# Patient Record
Sex: Female | Born: 1945
Health system: Southern US, Community
[De-identification: ages and names within clinical notes are randomized; demographics above are authoritative.]

## PROBLEM LIST (undated history)

## (undated) DIAGNOSIS — I8392 Asymptomatic varicose veins of left lower extremity: Secondary | ICD-10-CM

## (undated) DIAGNOSIS — T8859XA Other complications of anesthesia, initial encounter: Secondary | ICD-10-CM

## (undated) DIAGNOSIS — M1990 Unspecified osteoarthritis, unspecified site: Secondary | ICD-10-CM

## (undated) DIAGNOSIS — T7840XA Allergy, unspecified, initial encounter: Secondary | ICD-10-CM

## (undated) DIAGNOSIS — K5790 Diverticulosis of intestine, part unspecified, without perforation or abscess without bleeding: Secondary | ICD-10-CM

## (undated) DIAGNOSIS — J302 Other seasonal allergic rhinitis: Secondary | ICD-10-CM

## (undated) DIAGNOSIS — G4733 Obstructive sleep apnea (adult) (pediatric): Secondary | ICD-10-CM

## (undated) DIAGNOSIS — I872 Venous insufficiency (chronic) (peripheral): Secondary | ICD-10-CM

## (undated) DIAGNOSIS — G4730 Sleep apnea, unspecified: Secondary | ICD-10-CM

## (undated) DIAGNOSIS — D3132 Benign neoplasm of left choroid: Secondary | ICD-10-CM

## (undated) DIAGNOSIS — K449 Diaphragmatic hernia without obstruction or gangrene: Secondary | ICD-10-CM

## (undated) DIAGNOSIS — H903 Sensorineural hearing loss, bilateral: Secondary | ICD-10-CM

## (undated) DIAGNOSIS — I82409 Acute embolism and thrombosis of unspecified deep veins of unspecified lower extremity: Secondary | ICD-10-CM

## (undated) DIAGNOSIS — T4145XA Adverse effect of unspecified anesthetic, initial encounter: Secondary | ICD-10-CM

## (undated) DIAGNOSIS — Z87442 Personal history of urinary calculi: Secondary | ICD-10-CM

## (undated) DIAGNOSIS — E559 Vitamin D deficiency, unspecified: Secondary | ICD-10-CM

## (undated) DIAGNOSIS — J45909 Unspecified asthma, uncomplicated: Secondary | ICD-10-CM

## (undated) DIAGNOSIS — Z8489 Family history of other specified conditions: Secondary | ICD-10-CM

## (undated) DIAGNOSIS — M47812 Spondylosis without myelopathy or radiculopathy, cervical region: Secondary | ICD-10-CM

## (undated) DIAGNOSIS — J189 Pneumonia, unspecified organism: Secondary | ICD-10-CM

## (undated) DIAGNOSIS — D689 Coagulation defect, unspecified: Secondary | ICD-10-CM

## (undated) DIAGNOSIS — I10 Essential (primary) hypertension: Secondary | ICD-10-CM

## (undated) DIAGNOSIS — F09 Unspecified mental disorder due to known physiological condition: Secondary | ICD-10-CM

## (undated) DIAGNOSIS — C439 Malignant melanoma of skin, unspecified: Secondary | ICD-10-CM

## (undated) DIAGNOSIS — G709 Myoneural disorder, unspecified: Secondary | ICD-10-CM

## (undated) DIAGNOSIS — Z923 Personal history of irradiation: Secondary | ICD-10-CM

## (undated) DIAGNOSIS — T884XXA Failed or difficult intubation, initial encounter: Secondary | ICD-10-CM

## (undated) DIAGNOSIS — H269 Unspecified cataract: Secondary | ICD-10-CM

## (undated) DIAGNOSIS — G2581 Restless legs syndrome: Secondary | ICD-10-CM

## (undated) HISTORY — DX: Obstructive sleep apnea (adult) (pediatric): G47.33

## (undated) HISTORY — DX: Acute embolism and thrombosis of unspecified deep veins of unspecified lower extremity: I82.409

## (undated) HISTORY — DX: Adverse effect of unspecified anesthetic, initial encounter: T41.45XA

## (undated) HISTORY — DX: Personal history of urinary calculi: Z87.442

## (undated) HISTORY — DX: Other seasonal allergic rhinitis: J30.2

## (undated) HISTORY — DX: Asymptomatic varicose veins of left lower extremity: I83.92

## (undated) HISTORY — DX: Diaphragmatic hernia without obstruction or gangrene: K44.9

## (undated) HISTORY — DX: Unspecified osteoarthritis, unspecified site: M19.90

## (undated) HISTORY — PX: PORTACATH PLACEMENT: SHX2246

## (undated) HISTORY — DX: Unspecified cataract: H26.9

## (undated) HISTORY — DX: Sleep apnea, unspecified: G47.30

## (undated) HISTORY — DX: Benign neoplasm of left choroid: D31.32

## (undated) HISTORY — DX: Restless legs syndrome: G25.81

## (undated) HISTORY — PX: UPPER GASTROINTESTINAL ENDOSCOPY: SHX188

## (undated) HISTORY — DX: Unspecified mental disorder due to known physiological condition: F09

## (undated) HISTORY — DX: Spondylosis without myelopathy or radiculopathy, cervical region: M47.812

## (undated) HISTORY — DX: Unspecified asthma, uncomplicated: J45.909

## (undated) HISTORY — DX: Sensorineural hearing loss, bilateral: H90.3

## (undated) HISTORY — DX: Vitamin D deficiency, unspecified: E55.9

## (undated) HISTORY — DX: Malignant melanoma of skin, unspecified: C43.9

## (undated) HISTORY — DX: Coagulation defect, unspecified: D68.9

## (undated) HISTORY — DX: Venous insufficiency (chronic) (peripheral): I87.2

## (undated) HISTORY — DX: Essential (primary) hypertension: I10

## (undated) HISTORY — DX: Diverticulosis of intestine, part unspecified, without perforation or abscess without bleeding: K57.90

## (undated) HISTORY — DX: Personal history of irradiation: Z92.3

## (undated) HISTORY — DX: Allergy, unspecified, initial encounter: T78.40XA

## (undated) HISTORY — DX: Other complications of anesthesia, initial encounter: T88.59XA

---

## 1983-11-06 HISTORY — PX: KNEE SURGERY: SHX244

## 1988-11-05 HISTORY — PX: BREAST SURGERY: SHX581

## 1999-11-06 HISTORY — PX: LUMBAR LAMINECTOMY: SHX95

## 2004-11-05 HISTORY — PX: ESOPHAGOGASTRODUODENOSCOPY: SHX1529

## 2005-03-05 HISTORY — PX: COLONOSCOPY: SHX174

## 2005-03-19 LAB — HM COLONOSCOPY: HM Colonoscopy: 1

## 2008-11-05 HISTORY — PX: DILATION AND CURETTAGE OF UTERUS: SHX78

## 2008-11-05 HISTORY — PX: ENDOVENOUS ABLATION SAPHENOUS VEIN W/ LASER: SUR449

## 2008-11-05 HISTORY — PX: CARDIOVASCULAR STRESS TEST: SHX262

## 2009-11-05 DIAGNOSIS — I82409 Acute embolism and thrombosis of unspecified deep veins of unspecified lower extremity: Secondary | ICD-10-CM

## 2009-11-05 HISTORY — DX: Acute embolism and thrombosis of unspecified deep veins of unspecified lower extremity: I82.409

## 2009-11-05 HISTORY — PX: MELANOMA EXCISION: SHX5266

## 2009-12-06 LAB — HM DEXA SCAN

## 2010-06-29 DIAGNOSIS — C439 Malignant melanoma of skin, unspecified: Secondary | ICD-10-CM

## 2010-06-29 HISTORY — DX: Malignant melanoma of skin, unspecified: C43.9

## 2010-07-20 ENCOUNTER — Ambulatory Visit (HOSPITAL_COMMUNITY): Admission: RE | Admit: 2010-07-20 | Discharge: 2010-07-20 | Payer: Self-pay | Admitting: General Surgery

## 2010-08-23 ENCOUNTER — Encounter: Admission: RE | Admit: 2010-08-23 | Discharge: 2010-08-23 | Payer: Self-pay | Admitting: General Surgery

## 2010-08-24 ENCOUNTER — Ambulatory Visit (HOSPITAL_BASED_OUTPATIENT_CLINIC_OR_DEPARTMENT_OTHER): Admission: RE | Admit: 2010-08-24 | Discharge: 2010-08-25 | Payer: Self-pay | Admitting: General Surgery

## 2010-09-01 ENCOUNTER — Ambulatory Visit: Payer: Self-pay | Admitting: Hematology & Oncology

## 2010-09-07 LAB — CBC WITH DIFFERENTIAL (CANCER CENTER ONLY)
BASO#: 0.1 10*3/uL (ref 0.0–0.2)
BASO%: 1.4 % (ref 0.0–2.0)
EOS%: 3.2 % (ref 0.0–7.0)
Eosinophils Absolute: 0.3 10*3/uL (ref 0.0–0.5)
HCT: 38.8 % (ref 34.8–46.6)
HGB: 13.3 g/dL (ref 11.6–15.9)
LYMPH#: 2.1 10*3/uL (ref 0.9–3.3)
LYMPH%: 25.5 % (ref 14.0–48.0)
MCH: 30.3 pg (ref 26.0–34.0)
MCHC: 34.2 g/dL (ref 32.0–36.0)
MCV: 89 fL (ref 81–101)
MONO#: 0.4 10*3/uL (ref 0.1–0.9)
MONO%: 4.8 % (ref 0.0–13.0)
NEUT#: 5.2 10*3/uL (ref 1.5–6.5)
NEUT%: 65.1 % (ref 39.6–80.0)
Platelets: 229 10*3/uL (ref 145–400)
RBC: 4.37 10*6/uL (ref 3.70–5.32)
RDW: 11.8 % (ref 10.5–14.6)
WBC: 8.1 10*3/uL (ref 3.9–10.0)

## 2010-09-07 LAB — COMPREHENSIVE METABOLIC PANEL
ALT: 10 U/L (ref 0–35)
AST: 8 U/L (ref 0–37)
Albumin: 4.3 g/dL (ref 3.5–5.2)
Alkaline Phosphatase: 58 U/L (ref 39–117)
BUN: 20 mg/dL (ref 6–23)
CO2: 25 mEq/L (ref 19–32)
Calcium: 10.1 mg/dL (ref 8.4–10.5)
Chloride: 101 mEq/L (ref 96–112)
Creatinine, Ser: 0.78 mg/dL (ref 0.40–1.20)
Glucose, Bld: 100 mg/dL — ABNORMAL HIGH (ref 70–99)
Potassium: 3.6 mEq/L (ref 3.5–5.3)
Sodium: 141 mEq/L (ref 135–145)
Total Bilirubin: 0.6 mg/dL (ref 0.3–1.2)
Total Protein: 6.9 g/dL (ref 6.0–8.3)

## 2010-09-07 LAB — LACTATE DEHYDROGENASE: LDH: 162 U/L (ref 94–250)

## 2010-09-07 LAB — TECHNOLOGIST REVIEW CHCC SATELLITE: Tech Review: ADEQUATE

## 2010-09-13 ENCOUNTER — Ambulatory Visit (HOSPITAL_COMMUNITY): Admission: RE | Admit: 2010-09-13 | Discharge: 2010-09-13 | Payer: Self-pay | Admitting: Hematology & Oncology

## 2010-10-06 ENCOUNTER — Ambulatory Visit (HOSPITAL_BASED_OUTPATIENT_CLINIC_OR_DEPARTMENT_OTHER)
Admission: RE | Admit: 2010-10-06 | Discharge: 2010-10-07 | Payer: Self-pay | Source: Home / Self Care | Admitting: General Surgery

## 2010-10-17 ENCOUNTER — Ambulatory Visit: Payer: Self-pay | Admitting: Hematology & Oncology

## 2010-10-19 LAB — COMPREHENSIVE METABOLIC PANEL
ALT: 13 U/L (ref 0–35)
AST: 8 U/L (ref 0–37)
Albumin: 4 g/dL (ref 3.5–5.2)
Alkaline Phosphatase: 55 U/L (ref 39–117)
BUN: 21 mg/dL (ref 6–23)
CO2: 27 mEq/L (ref 19–32)
Calcium: 9.6 mg/dL (ref 8.4–10.5)
Chloride: 104 mEq/L (ref 96–112)
Creatinine, Ser: 0.81 mg/dL (ref 0.40–1.20)
Glucose, Bld: 100 mg/dL — ABNORMAL HIGH (ref 70–99)
Potassium: 3.8 mEq/L (ref 3.5–5.3)
Sodium: 143 mEq/L (ref 135–145)
Total Bilirubin: 0.4 mg/dL (ref 0.3–1.2)
Total Protein: 6.1 g/dL (ref 6.0–8.3)

## 2010-10-19 LAB — CBC WITH DIFFERENTIAL (CANCER CENTER ONLY)
BASO#: 0 10*3/uL (ref 0.0–0.2)
BASO%: 0.5 % (ref 0.0–2.0)
EOS%: 3.1 % (ref 0.0–7.0)
Eosinophils Absolute: 0.2 10*3/uL (ref 0.0–0.5)
HCT: 37.2 % (ref 34.8–46.6)
HGB: 12.5 g/dL (ref 11.6–15.9)
LYMPH#: 1.8 10*3/uL (ref 0.9–3.3)
LYMPH%: 28.6 % (ref 14.0–48.0)
MCH: 30 pg (ref 26.0–34.0)
MCHC: 33.6 g/dL (ref 32.0–36.0)
MCV: 89 fL (ref 81–101)
MONO#: 0.3 10*3/uL (ref 0.1–0.9)
MONO%: 5.2 % (ref 0.0–13.0)
NEUT#: 4 10*3/uL (ref 1.5–6.5)
NEUT%: 62.6 % (ref 39.6–80.0)
Platelets: 216 10*3/uL (ref 145–400)
RBC: 4.17 10*6/uL (ref 3.70–5.32)
RDW: 11.6 % (ref 10.5–14.6)
WBC: 6.4 10*3/uL (ref 3.9–10.0)

## 2010-10-19 LAB — T4: T4, Total: 8.6 ug/dL (ref 5.0–12.5)

## 2010-10-19 LAB — TSH: TSH: 1.8 u[IU]/mL (ref 0.350–4.500)

## 2010-11-05 DIAGNOSIS — M47812 Spondylosis without myelopathy or radiculopathy, cervical region: Secondary | ICD-10-CM

## 2010-11-05 HISTORY — PX: DEEP AXILLARY SENTINEL NODE BIOPSY / EXCISION: SUR130

## 2010-11-05 HISTORY — DX: Spondylosis without myelopathy or radiculopathy, cervical region: M47.812

## 2010-11-08 ENCOUNTER — Ambulatory Visit (HOSPITAL_COMMUNITY)
Admission: RE | Admit: 2010-11-08 | Discharge: 2010-11-08 | Payer: Self-pay | Source: Home / Self Care | Attending: General Surgery | Admitting: General Surgery

## 2010-11-17 ENCOUNTER — Ambulatory Visit: Payer: Self-pay | Admitting: Hematology & Oncology

## 2010-11-20 LAB — CMP (CANCER CENTER ONLY)
ALT(SGPT): 41 U/L (ref 10–47)
AST: 34 U/L (ref 11–38)
Albumin: 3.3 g/dL (ref 3.3–5.5)
Alkaline Phosphatase: 42 U/L (ref 26–84)
BUN, Bld: 14 mg/dL (ref 7–22)
CO2: 32 mEq/L (ref 18–33)
Calcium: 9.2 mg/dL (ref 8.0–10.3)
Chloride: 106 mEq/L (ref 98–108)
Creat: 0.7 mg/dl (ref 0.6–1.2)
Glucose, Bld: 111 mg/dL (ref 73–118)
Potassium: 3.9 mEq/L (ref 3.3–4.7)
Sodium: 146 mEq/L — ABNORMAL HIGH (ref 128–145)
Total Bilirubin: 0.5 mg/dl (ref 0.20–1.60)
Total Protein: 6.2 g/dL — ABNORMAL LOW (ref 6.4–8.1)

## 2010-11-20 LAB — CBC WITH DIFFERENTIAL (CANCER CENTER ONLY)
BASO#: 0 10*3/uL (ref 0.0–0.2)
BASO%: 0.4 % (ref 0.0–2.0)
EOS%: 1 % (ref 0.0–7.0)
Eosinophils Absolute: 0 10*3/uL (ref 0.0–0.5)
HCT: 36.2 % (ref 34.8–46.6)
HGB: 12.4 g/dL (ref 11.6–15.9)
LYMPH#: 1.2 10*3/uL (ref 0.9–3.3)
LYMPH%: 41.1 % (ref 14.0–48.0)
MCH: 29.9 pg (ref 26.0–34.0)
MCHC: 34.2 g/dL (ref 32.0–36.0)
MCV: 88 fL (ref 81–101)
MONO#: 0.2 10*3/uL (ref 0.1–0.9)
MONO%: 8.4 % (ref 0.0–13.0)
NEUT#: 1.4 10*3/uL — ABNORMAL LOW (ref 1.5–6.5)
NEUT%: 49.1 % (ref 39.6–80.0)
Platelets: 94 10*3/uL — ABNORMAL LOW (ref 145–400)
RBC: 4.13 10*6/uL (ref 3.70–5.32)
RDW: 11.7 % (ref 10.5–14.6)
WBC: 2.8 10*3/uL — ABNORMAL LOW (ref 3.9–10.0)

## 2010-11-20 LAB — LACTATE DEHYDROGENASE: LDH: 207 U/L (ref 94–250)

## 2010-11-20 LAB — TSH: TSH: 2.119 u[IU]/mL (ref 0.350–4.500)

## 2010-11-27 LAB — COMPREHENSIVE METABOLIC PANEL
ALT: 56 U/L — ABNORMAL HIGH (ref 0–35)
AST: 26 U/L (ref 0–37)
Albumin: 3.5 g/dL (ref 3.5–5.2)
Alkaline Phosphatase: 41 U/L (ref 39–117)
BUN: 12 mg/dL (ref 6–23)
CO2: 30 mEq/L (ref 19–32)
Calcium: 8.7 mg/dL (ref 8.4–10.5)
Chloride: 105 mEq/L (ref 96–112)
Creatinine, Ser: 0.63 mg/dL (ref 0.40–1.20)
Glucose, Bld: 93 mg/dL (ref 70–99)
Potassium: 3.4 mEq/L — ABNORMAL LOW (ref 3.5–5.3)
Sodium: 145 mEq/L (ref 135–145)
Total Bilirubin: 0.3 mg/dL (ref 0.3–1.2)
Total Protein: 5.7 g/dL — ABNORMAL LOW (ref 6.0–8.3)

## 2010-11-27 LAB — CBC WITH DIFFERENTIAL (CANCER CENTER ONLY)
BASO#: 0 10*3/uL (ref 0.0–0.2)
BASO%: 0.4 % (ref 0.0–2.0)
EOS%: 1.3 % (ref 0.0–7.0)
Eosinophils Absolute: 0 10*3/uL (ref 0.0–0.5)
HCT: 36.6 % (ref 34.8–46.6)
HGB: 12.4 g/dL (ref 11.6–15.9)
LYMPH#: 1 10*3/uL (ref 0.9–3.3)
LYMPH%: 35.5 % (ref 14.0–48.0)
MCH: 29.6 pg (ref 26.0–34.0)
MCHC: 33.8 g/dL (ref 32.0–36.0)
MCV: 87 fL (ref 81–101)
MONO#: 0.3 10*3/uL (ref 0.1–0.9)
MONO%: 9.2 % (ref 0.0–13.0)
NEUT#: 1.5 10*3/uL (ref 1.5–6.5)
NEUT%: 53.6 % (ref 39.6–80.0)
Platelets: 110 10*3/uL — ABNORMAL LOW (ref 145–400)
RBC: 4.19 10*6/uL (ref 3.70–5.32)
RDW: 11.6 % (ref 10.5–14.6)
WBC: 2.7 10*3/uL — ABNORMAL LOW (ref 3.9–10.0)

## 2010-11-28 ENCOUNTER — Encounter
Admission: RE | Admit: 2010-11-28 | Discharge: 2010-12-04 | Payer: Self-pay | Source: Home / Self Care | Attending: General Surgery | Admitting: General Surgery

## 2010-12-04 LAB — CMP (CANCER CENTER ONLY)
ALT(SGPT): 68 U/L — ABNORMAL HIGH (ref 10–47)
AST: 43 U/L — ABNORMAL HIGH (ref 11–38)
Albumin: 3.1 g/dL — ABNORMAL LOW (ref 3.3–5.5)
Alkaline Phosphatase: 53 U/L (ref 26–84)
BUN, Bld: 11 mg/dL (ref 7–22)
CO2: 32 mEq/L (ref 18–33)
Calcium: 8.5 mg/dL (ref 8.0–10.3)
Chloride: 104 mEq/L (ref 98–108)
Creat: 0.6 mg/dl (ref 0.6–1.2)
Glucose, Bld: 100 mg/dL (ref 73–118)
Potassium: 3 mEq/L — ABNORMAL LOW (ref 3.3–4.7)
Sodium: 141 mEq/L (ref 128–145)
Total Bilirubin: 0.6 mg/dl (ref 0.20–1.60)
Total Protein: 5.9 g/dL — ABNORMAL LOW (ref 6.4–8.1)

## 2010-12-04 LAB — LACTATE DEHYDROGENASE: LDH: 240 U/L (ref 94–250)

## 2010-12-04 LAB — CBC WITH DIFFERENTIAL (CANCER CENTER ONLY)
BASO#: 0 10*3/uL (ref 0.0–0.2)
BASO%: 0.6 % (ref 0.0–2.0)
EOS%: 0.5 % (ref 0.0–7.0)
Eosinophils Absolute: 0 10*3/uL (ref 0.0–0.5)
HCT: 34.3 % — ABNORMAL LOW (ref 34.8–46.6)
HGB: 11.7 g/dL (ref 11.6–15.9)
LYMPH#: 0.8 10*3/uL — ABNORMAL LOW (ref 0.9–3.3)
LYMPH%: 40.6 % (ref 14.0–48.0)
MCH: 29.6 pg (ref 26.0–34.0)
MCHC: 34 g/dL (ref 32.0–36.0)
MCV: 87 fL (ref 81–101)
MONO#: 0.2 10*3/uL (ref 0.1–0.9)
MONO%: 12.1 % (ref 0.0–13.0)
NEUT#: 0.9 10*3/uL — ABNORMAL LOW (ref 1.5–6.5)
NEUT%: 46.2 % (ref 39.6–80.0)
Platelets: 103 10*3/uL — ABNORMAL LOW (ref 145–400)
RBC: 3.93 10*6/uL (ref 3.70–5.32)
RDW: 12.2 % (ref 10.5–14.6)
WBC: 1.9 10*3/uL — ABNORMAL LOW (ref 3.9–10.0)

## 2010-12-11 ENCOUNTER — Other Ambulatory Visit: Payer: Self-pay | Admitting: Family

## 2010-12-11 ENCOUNTER — Encounter (HOSPITAL_BASED_OUTPATIENT_CLINIC_OR_DEPARTMENT_OTHER): Payer: Private Health Insurance - Indemnity | Admitting: Hematology & Oncology

## 2010-12-11 DIAGNOSIS — Z5112 Encounter for antineoplastic immunotherapy: Secondary | ICD-10-CM

## 2010-12-11 DIAGNOSIS — C774 Secondary and unspecified malignant neoplasm of inguinal and lower limb lymph nodes: Secondary | ICD-10-CM

## 2010-12-11 DIAGNOSIS — C4359 Malignant melanoma of other part of trunk: Secondary | ICD-10-CM

## 2010-12-11 LAB — COMPREHENSIVE METABOLIC PANEL
ALT: 29 U/L (ref 0–35)
AST: 13 U/L (ref 0–37)
Albumin: 3.9 g/dL (ref 3.5–5.2)
Alkaline Phosphatase: 60 U/L (ref 39–117)
BUN: 16 mg/dL (ref 6–23)
CO2: 33 mEq/L — ABNORMAL HIGH (ref 19–32)
Calcium: 9.4 mg/dL (ref 8.4–10.5)
Chloride: 104 mEq/L (ref 96–112)
Creatinine, Ser: 0.76 mg/dL (ref 0.40–1.20)
Glucose, Bld: 105 mg/dL — ABNORMAL HIGH (ref 70–99)
Potassium: 2.8 mEq/L — ABNORMAL LOW (ref 3.5–5.3)
Sodium: 147 mEq/L — ABNORMAL HIGH (ref 135–145)
Total Bilirubin: 0.7 mg/dL (ref 0.3–1.2)
Total Protein: 6.3 g/dL (ref 6.0–8.3)

## 2010-12-11 LAB — CBC WITH DIFFERENTIAL (CANCER CENTER ONLY)
BASO#: 0 10*3/uL (ref 0.0–0.2)
BASO%: 0.4 % (ref 0.0–2.0)
EOS%: 0.7 % (ref 0.0–7.0)
Eosinophils Absolute: 0 10*3/uL (ref 0.0–0.5)
HCT: 33.7 % — ABNORMAL LOW (ref 34.8–46.6)
HGB: 11.5 g/dL — ABNORMAL LOW (ref 11.6–15.9)
LYMPH#: 1.2 10*3/uL (ref 0.9–3.3)
LYMPH%: 20.3 % (ref 14.0–48.0)
MCH: 30 pg (ref 26.0–34.0)
MCHC: 34.2 g/dL (ref 32.0–36.0)
MCV: 88 fL (ref 81–101)
MONO#: 0.7 10*3/uL (ref 0.1–0.9)
MONO%: 12.7 % (ref 0.0–13.0)
NEUT#: 3.7 10*3/uL (ref 1.5–6.5)
NEUT%: 65.9 % (ref 39.6–80.0)
Platelets: 173 10*3/uL (ref 145–400)
RBC: 3.84 10*6/uL (ref 3.70–5.32)
RDW: 12 % (ref 10.5–14.6)
WBC: 5.7 10*3/uL (ref 3.9–10.0)

## 2010-12-12 ENCOUNTER — Encounter (HOSPITAL_BASED_OUTPATIENT_CLINIC_OR_DEPARTMENT_OTHER): Payer: Private Health Insurance - Indemnity | Admitting: Hematology & Oncology

## 2010-12-12 ENCOUNTER — Other Ambulatory Visit: Payer: Self-pay | Admitting: Family

## 2010-12-12 DIAGNOSIS — Z5112 Encounter for antineoplastic immunotherapy: Secondary | ICD-10-CM

## 2010-12-12 DIAGNOSIS — C4359 Malignant melanoma of other part of trunk: Secondary | ICD-10-CM

## 2010-12-12 LAB — BASIC METABOLIC PANEL - CANCER CENTER ONLY
BUN, Bld: 13 mg/dL (ref 7–22)
CO2: 32 mEq/L (ref 18–33)
Calcium: 8.9 mg/dL (ref 8.0–10.3)
Chloride: 104 mEq/L (ref 98–108)
Creat: 0.7 mg/dl (ref 0.6–1.2)
Glucose, Bld: 109 mg/dL (ref 73–118)
Potassium: 3 mEq/L — ABNORMAL LOW (ref 3.3–4.7)
Sodium: 146 mEq/L — ABNORMAL HIGH (ref 128–145)

## 2010-12-13 ENCOUNTER — Other Ambulatory Visit: Payer: Self-pay | Admitting: Family

## 2010-12-13 ENCOUNTER — Encounter (HOSPITAL_BASED_OUTPATIENT_CLINIC_OR_DEPARTMENT_OTHER): Payer: Private Health Insurance - Indemnity | Admitting: Hematology & Oncology

## 2010-12-13 ENCOUNTER — Encounter: Payer: Self-pay | Admitting: Rehabilitation

## 2010-12-13 DIAGNOSIS — Z5112 Encounter for antineoplastic immunotherapy: Secondary | ICD-10-CM

## 2010-12-13 DIAGNOSIS — C4359 Malignant melanoma of other part of trunk: Secondary | ICD-10-CM

## 2010-12-13 LAB — BASIC METABOLIC PANEL - CANCER CENTER ONLY
BUN, Bld: 16 mg/dL (ref 7–22)
CO2: 30 mEq/L (ref 18–33)
Calcium: 8.4 mg/dL (ref 8.0–10.3)
Chloride: 105 mEq/L (ref 98–108)
Creat: 0.7 mg/dl (ref 0.6–1.2)
Glucose, Bld: 126 mg/dL — ABNORMAL HIGH (ref 73–118)
Potassium: 3.2 mEq/L — ABNORMAL LOW (ref 3.3–4.7)
Sodium: 143 mEq/L (ref 128–145)

## 2010-12-14 ENCOUNTER — Encounter (HOSPITAL_BASED_OUTPATIENT_CLINIC_OR_DEPARTMENT_OTHER): Payer: Private Health Insurance - Indemnity | Admitting: Hematology & Oncology

## 2010-12-14 ENCOUNTER — Other Ambulatory Visit: Payer: Self-pay | Admitting: Family

## 2010-12-14 DIAGNOSIS — Z5112 Encounter for antineoplastic immunotherapy: Secondary | ICD-10-CM

## 2010-12-14 DIAGNOSIS — C4359 Malignant melanoma of other part of trunk: Secondary | ICD-10-CM

## 2010-12-14 LAB — BASIC METABOLIC PANEL - CANCER CENTER ONLY
BUN, Bld: 15 mg/dL (ref 7–22)
CO2: 29 mEq/L (ref 18–33)
Calcium: 8.6 mg/dL (ref 8.0–10.3)
Chloride: 104 mEq/L (ref 98–108)
Creat: 0.7 mg/dl (ref 0.6–1.2)
Glucose, Bld: 120 mg/dL — ABNORMAL HIGH (ref 73–118)
Potassium: 3.8 mEq/L (ref 3.3–4.7)
Sodium: 143 mEq/L (ref 128–145)

## 2010-12-15 ENCOUNTER — Encounter (HOSPITAL_BASED_OUTPATIENT_CLINIC_OR_DEPARTMENT_OTHER): Payer: Private Health Insurance - Indemnity | Admitting: Hematology & Oncology

## 2010-12-15 ENCOUNTER — Encounter: Payer: Self-pay | Admitting: Rehabilitation

## 2010-12-15 DIAGNOSIS — Z5112 Encounter for antineoplastic immunotherapy: Secondary | ICD-10-CM

## 2010-12-15 DIAGNOSIS — C4359 Malignant melanoma of other part of trunk: Secondary | ICD-10-CM

## 2010-12-18 ENCOUNTER — Other Ambulatory Visit: Payer: Self-pay | Admitting: Family

## 2010-12-18 ENCOUNTER — Encounter (HOSPITAL_BASED_OUTPATIENT_CLINIC_OR_DEPARTMENT_OTHER): Payer: Private Health Insurance - Indemnity | Admitting: Hematology & Oncology

## 2010-12-18 DIAGNOSIS — C4359 Malignant melanoma of other part of trunk: Secondary | ICD-10-CM

## 2010-12-18 LAB — CBC WITH DIFFERENTIAL (CANCER CENTER ONLY)
BASO#: 0 10*3/uL (ref 0.0–0.2)
BASO%: 0.7 % (ref 0.0–2.0)
EOS%: 1.4 % (ref 0.0–7.0)
Eosinophils Absolute: 0 10*3/uL (ref 0.0–0.5)
HCT: 33.4 % — ABNORMAL LOW (ref 34.8–46.6)
HGB: 11.4 g/dL — ABNORMAL LOW (ref 11.6–15.9)
LYMPH#: 1.4 10*3/uL (ref 0.9–3.3)
LYMPH%: 44.7 % (ref 14.0–48.0)
MCH: 29.8 pg (ref 26.0–34.0)
MCHC: 34.3 g/dL (ref 32.0–36.0)
MCV: 87 fL (ref 81–101)
MONO#: 0.4 10*3/uL (ref 0.1–0.9)
MONO%: 12 % (ref 0.0–13.0)
NEUT#: 1.3 10*3/uL — ABNORMAL LOW (ref 1.5–6.5)
NEUT%: 41.2 % (ref 39.6–80.0)
Platelets: 128 10*3/uL — ABNORMAL LOW (ref 145–400)
RBC: 3.84 10*6/uL (ref 3.70–5.32)
RDW: 11.9 % (ref 10.5–14.6)
WBC: 3.1 10*3/uL — ABNORMAL LOW (ref 3.9–10.0)

## 2010-12-27 ENCOUNTER — Encounter: Payer: Self-pay | Admitting: Rehabilitation

## 2010-12-29 ENCOUNTER — Ambulatory Visit: Payer: Private Health Insurance - Indemnity | Attending: General Surgery | Admitting: Rehabilitation

## 2010-12-29 DIAGNOSIS — M25619 Stiffness of unspecified shoulder, not elsewhere classified: Secondary | ICD-10-CM | POA: Insufficient documentation

## 2010-12-29 DIAGNOSIS — IMO0001 Reserved for inherently not codable concepts without codable children: Secondary | ICD-10-CM | POA: Insufficient documentation

## 2011-01-03 ENCOUNTER — Ambulatory Visit: Payer: Private Health Insurance - Indemnity | Admitting: Physical Therapy

## 2011-01-05 ENCOUNTER — Ambulatory Visit: Payer: Private Health Insurance - Indemnity | Attending: General Surgery | Admitting: Rehabilitation

## 2011-01-05 DIAGNOSIS — IMO0001 Reserved for inherently not codable concepts without codable children: Secondary | ICD-10-CM | POA: Insufficient documentation

## 2011-01-05 DIAGNOSIS — M25619 Stiffness of unspecified shoulder, not elsewhere classified: Secondary | ICD-10-CM | POA: Insufficient documentation

## 2011-01-10 ENCOUNTER — Ambulatory Visit: Payer: Private Health Insurance - Indemnity | Admitting: Rehabilitation

## 2011-01-12 ENCOUNTER — Ambulatory Visit: Payer: Private Health Insurance - Indemnity | Admitting: Physical Therapy

## 2011-01-15 ENCOUNTER — Encounter (HOSPITAL_BASED_OUTPATIENT_CLINIC_OR_DEPARTMENT_OTHER): Payer: Private Health Insurance - Indemnity | Admitting: Hematology & Oncology

## 2011-01-15 DIAGNOSIS — C4359 Malignant melanoma of other part of trunk: Secondary | ICD-10-CM

## 2011-01-15 DIAGNOSIS — Z5112 Encounter for antineoplastic immunotherapy: Secondary | ICD-10-CM

## 2011-01-15 LAB — DIFFERENTIAL
Basophils Absolute: 0 10*3/uL (ref 0.0–0.1)
Basophils Relative: 0 % (ref 0–1)
Eosinophils Absolute: 0.1 10*3/uL (ref 0.0–0.7)
Eosinophils Relative: 2 % (ref 0–5)
Lymphocytes Relative: 34 % (ref 12–46)
Lymphs Abs: 2.1 10*3/uL (ref 0.7–4.0)
Monocytes Absolute: 0.5 10*3/uL (ref 0.1–1.0)
Monocytes Relative: 7 % (ref 3–12)
Neutro Abs: 3.5 10*3/uL (ref 1.7–7.7)
Neutrophils Relative %: 56 % (ref 43–77)

## 2011-01-15 LAB — BASIC METABOLIC PANEL
BUN: 17 mg/dL (ref 6–23)
CO2: 30 mEq/L (ref 19–32)
Calcium: 9.6 mg/dL (ref 8.4–10.5)
Chloride: 104 mEq/L (ref 96–112)
Creatinine, Ser: 0.74 mg/dL (ref 0.4–1.2)
GFR calc Af Amer: >60 mL/min (ref >60)
GFR calc non Af Amer: >60 mL/min (ref >60)
Glucose, Bld: 102 mg/dL — ABNORMAL HIGH (ref 70–99)
Potassium: 3.7 mEq/L (ref 3.5–5.1)
Sodium: 142 mEq/L (ref 135–145)

## 2011-01-15 LAB — CBC
HCT: 40 % (ref 36.0–46.0)
Hemoglobin: 13 g/dL (ref 12.0–15.0)
MCH: 29.4 pg (ref 26.0–34.0)
MCHC: 32.5 g/dL (ref 30.0–36.0)
MCV: 90.5 fL (ref 78.0–100.0)
Platelets: 177 10*3/uL (ref 150–400)
RBC: 4.42 MIL/uL (ref 3.87–5.11)
RDW: 13 % (ref 11.5–15.5)
WBC: 6.2 10*3/uL (ref 4.0–10.5)

## 2011-01-15 LAB — APTT: aPTT: 27 seconds (ref 24–37)

## 2011-01-15 LAB — PROTIME-INR
INR: 0.99 (ref 0.00–1.49)
Prothrombin Time: 13.3 seconds (ref 11.6–15.2)

## 2011-01-15 LAB — SURGICAL PCR SCREEN
MRSA, PCR: NEGATIVE
Staphylococcus aureus: NEGATIVE

## 2011-01-16 ENCOUNTER — Encounter (HOSPITAL_BASED_OUTPATIENT_CLINIC_OR_DEPARTMENT_OTHER): Payer: Private Health Insurance - Indemnity | Admitting: Hematology & Oncology

## 2011-01-16 DIAGNOSIS — Z5112 Encounter for antineoplastic immunotherapy: Secondary | ICD-10-CM

## 2011-01-16 DIAGNOSIS — C4359 Malignant melanoma of other part of trunk: Secondary | ICD-10-CM

## 2011-01-16 LAB — DIFFERENTIAL
Basophils Absolute: 0 10*3/uL (ref 0.0–0.1)
Basophils Relative: 0 % (ref 0–1)
Eosinophils Absolute: 0.3 10*3/uL (ref 0.0–0.7)
Eosinophils Relative: 4 % (ref 0–5)
Lymphocytes Relative: 32 % (ref 12–46)
Lymphs Abs: 2.2 10*3/uL (ref 0.7–4.0)
Monocytes Absolute: 0.5 10*3/uL (ref 0.1–1.0)
Monocytes Relative: 7 % (ref 3–12)
Neutro Abs: 3.9 10*3/uL (ref 1.7–7.7)
Neutrophils Relative %: 57 % (ref 43–77)

## 2011-01-16 LAB — CBC
HCT: 39.5 % (ref 36.0–46.0)
Hemoglobin: 12.8 g/dL (ref 12.0–15.0)
MCH: 29.8 pg (ref 26.0–34.0)
MCHC: 32.4 g/dL (ref 30.0–36.0)
MCV: 91.9 fL (ref 78.0–100.0)
Platelets: 200 10*3/uL (ref 150–400)
RBC: 4.3 MIL/uL (ref 3.87–5.11)
RDW: 13.1 % (ref 11.5–15.5)
WBC: 6.9 10*3/uL (ref 4.0–10.5)

## 2011-01-16 LAB — BASIC METABOLIC PANEL
BUN: 18 mg/dL (ref 6–23)
CO2: 32 mEq/L (ref 19–32)
Calcium: 9.6 mg/dL (ref 8.4–10.5)
Chloride: 104 mEq/L (ref 96–112)
Creatinine, Ser: 0.93 mg/dL (ref 0.4–1.2)
GFR calc Af Amer: >60 mL/min (ref >60)
GFR calc non Af Amer: >60 mL/min (ref >60)
Glucose, Bld: 128 mg/dL — ABNORMAL HIGH (ref 70–99)
Potassium: 3.6 mEq/L (ref 3.5–5.1)
Sodium: 141 mEq/L (ref 135–145)

## 2011-01-16 LAB — GLUCOSE, CAPILLARY: Glucose-Capillary: 99 mg/dL (ref 70–99)

## 2011-01-17 ENCOUNTER — Encounter (HOSPITAL_BASED_OUTPATIENT_CLINIC_OR_DEPARTMENT_OTHER): Payer: Private Health Insurance - Indemnity | Admitting: Hematology & Oncology

## 2011-01-17 DIAGNOSIS — C4359 Malignant melanoma of other part of trunk: Secondary | ICD-10-CM

## 2011-01-17 DIAGNOSIS — Z5112 Encounter for antineoplastic immunotherapy: Secondary | ICD-10-CM

## 2011-01-17 LAB — DIFFERENTIAL
Basophils Absolute: 0 10*3/uL (ref 0.0–0.1)
Basophils Relative: 0 % (ref 0–1)
Eosinophils Absolute: 0.1 10*3/uL (ref 0.0–0.7)
Eosinophils Relative: 2 % (ref 0–5)
Lymphocytes Relative: 29 % (ref 12–46)
Lymphs Abs: 1.6 10*3/uL (ref 0.7–4.0)
Monocytes Absolute: 0.3 10*3/uL (ref 0.1–1.0)
Monocytes Relative: 6 % (ref 3–12)
Neutro Abs: 3.4 10*3/uL (ref 1.7–7.7)
Neutrophils Relative %: 63 % (ref 43–77)

## 2011-01-17 LAB — COMPREHENSIVE METABOLIC PANEL
ALT: 15 U/L (ref 0–35)
AST: 14 U/L (ref 0–37)
Albumin: 3.9 g/dL (ref 3.5–5.2)
Alkaline Phosphatase: 56 U/L (ref 39–117)
BUN: 15 mg/dL (ref 6–23)
CO2: 32 mEq/L (ref 19–32)
Calcium: 9.5 mg/dL (ref 8.4–10.5)
Chloride: 105 mEq/L (ref 96–112)
Creatinine, Ser: 0.78 mg/dL (ref 0.4–1.2)
GFR calc Af Amer: >60 mL/min (ref >60)
GFR calc non Af Amer: >60 mL/min (ref >60)
Glucose, Bld: 94 mg/dL (ref 70–99)
Potassium: 3.5 mEq/L (ref 3.5–5.1)
Sodium: 143 mEq/L (ref 135–145)
Total Bilirubin: 0.9 mg/dL (ref 0.3–1.2)
Total Protein: 6.9 g/dL (ref 6.0–8.3)

## 2011-01-17 LAB — CBC
HCT: 42.1 % (ref 36.0–46.0)
Hemoglobin: 13.9 g/dL (ref 12.0–15.0)
MCH: 29.9 pg (ref 26.0–34.0)
MCHC: 33 g/dL (ref 30.0–36.0)
MCV: 90.5 fL (ref 78.0–100.0)
Platelets: 188 10*3/uL (ref 150–400)
RBC: 4.65 MIL/uL (ref 3.87–5.11)
RDW: 13.1 % (ref 11.5–15.5)
WBC: 5.3 10*3/uL (ref 4.0–10.5)

## 2011-01-18 ENCOUNTER — Encounter (HOSPITAL_BASED_OUTPATIENT_CLINIC_OR_DEPARTMENT_OTHER): Payer: Private Health Insurance - Indemnity

## 2011-01-18 DIAGNOSIS — Z5112 Encounter for antineoplastic immunotherapy: Secondary | ICD-10-CM

## 2011-01-18 DIAGNOSIS — C4359 Malignant melanoma of other part of trunk: Secondary | ICD-10-CM

## 2011-01-19 ENCOUNTER — Encounter: Payer: Private Health Insurance - Indemnity | Admitting: Physical Therapy

## 2011-01-19 ENCOUNTER — Encounter (HOSPITAL_BASED_OUTPATIENT_CLINIC_OR_DEPARTMENT_OTHER): Payer: Private Health Insurance - Indemnity

## 2011-01-22 ENCOUNTER — Ambulatory Visit: Payer: Private Health Insurance - Indemnity | Admitting: Physical Therapy

## 2011-01-22 ENCOUNTER — Other Ambulatory Visit: Payer: Self-pay | Admitting: Hematology & Oncology

## 2011-01-22 ENCOUNTER — Encounter (HOSPITAL_BASED_OUTPATIENT_CLINIC_OR_DEPARTMENT_OTHER): Payer: Private Health Insurance - Indemnity | Admitting: Hematology & Oncology

## 2011-01-22 ENCOUNTER — Other Ambulatory Visit: Payer: Self-pay | Admitting: Family

## 2011-01-22 DIAGNOSIS — C4359 Malignant melanoma of other part of trunk: Secondary | ICD-10-CM

## 2011-01-22 DIAGNOSIS — Z5112 Encounter for antineoplastic immunotherapy: Secondary | ICD-10-CM

## 2011-01-22 LAB — CBC WITH DIFFERENTIAL (CANCER CENTER ONLY)
BASO#: 0 10*3/uL (ref 0.0–0.2)
BASO%: 0.3 % (ref 0.0–2.0)
EOS%: 2 % (ref 0.0–7.0)
Eosinophils Absolute: 0.1 10*3/uL (ref 0.0–0.5)
HCT: 37.6 % (ref 34.8–46.6)
HGB: 12.7 g/dL (ref 11.6–15.9)
LYMPH#: 1.6 10*3/uL (ref 0.9–3.3)
LYMPH%: 46.9 % (ref 14.0–48.0)
MCH: 28.9 pg (ref 26.0–34.0)
MCHC: 33.8 g/dL (ref 32.0–36.0)
MCV: 86 fL (ref 81–101)
MONO#: 0.4 10*3/uL (ref 0.1–0.9)
MONO%: 10.5 % (ref 0.0–13.0)
NEUT#: 1.4 10*3/uL — ABNORMAL LOW (ref 1.5–6.5)
NEUT%: 40.3 % (ref 39.6–80.0)
Platelets: 140 10*3/uL — ABNORMAL LOW (ref 145–400)
RBC: 4.4 10*6/uL (ref 3.70–5.32)
RDW: 13.6 % (ref 11.1–15.7)
WBC: 3.4 10*3/uL — ABNORMAL LOW (ref 3.9–10.0)

## 2011-01-22 LAB — TSH: TSH: 1.926 u[IU]/mL (ref 0.350–4.500)

## 2011-01-22 LAB — T4: T4, Total: 9.8 ug/dL (ref 5.0–12.5)

## 2011-01-23 ENCOUNTER — Encounter (HOSPITAL_BASED_OUTPATIENT_CLINIC_OR_DEPARTMENT_OTHER): Payer: Private Health Insurance - Indemnity | Admitting: Hematology & Oncology

## 2011-01-23 DIAGNOSIS — Z5112 Encounter for antineoplastic immunotherapy: Secondary | ICD-10-CM

## 2011-01-23 DIAGNOSIS — C4359 Malignant melanoma of other part of trunk: Secondary | ICD-10-CM

## 2011-01-24 ENCOUNTER — Encounter (HOSPITAL_BASED_OUTPATIENT_CLINIC_OR_DEPARTMENT_OTHER): Payer: Private Health Insurance - Indemnity | Admitting: Hematology & Oncology

## 2011-01-24 DIAGNOSIS — C4359 Malignant melanoma of other part of trunk: Secondary | ICD-10-CM

## 2011-01-24 DIAGNOSIS — Z5112 Encounter for antineoplastic immunotherapy: Secondary | ICD-10-CM

## 2011-01-25 ENCOUNTER — Encounter (HOSPITAL_BASED_OUTPATIENT_CLINIC_OR_DEPARTMENT_OTHER): Payer: Private Health Insurance - Indemnity | Admitting: Hematology & Oncology

## 2011-01-25 DIAGNOSIS — Z5112 Encounter for antineoplastic immunotherapy: Secondary | ICD-10-CM

## 2011-01-25 DIAGNOSIS — C4359 Malignant melanoma of other part of trunk: Secondary | ICD-10-CM

## 2011-01-26 ENCOUNTER — Encounter (HOSPITAL_BASED_OUTPATIENT_CLINIC_OR_DEPARTMENT_OTHER): Payer: Private Health Insurance - Indemnity | Admitting: Hematology & Oncology

## 2011-01-26 DIAGNOSIS — Z5112 Encounter for antineoplastic immunotherapy: Secondary | ICD-10-CM

## 2011-01-26 DIAGNOSIS — C4359 Malignant melanoma of other part of trunk: Secondary | ICD-10-CM

## 2011-01-29 ENCOUNTER — Ambulatory Visit: Payer: Private Health Insurance - Indemnity | Admitting: Physical Therapy

## 2011-01-29 ENCOUNTER — Encounter (HOSPITAL_BASED_OUTPATIENT_CLINIC_OR_DEPARTMENT_OTHER): Payer: Private Health Insurance - Indemnity | Admitting: Hematology & Oncology

## 2011-01-29 ENCOUNTER — Other Ambulatory Visit: Payer: Self-pay | Admitting: Hematology & Oncology

## 2011-01-29 DIAGNOSIS — Z5112 Encounter for antineoplastic immunotherapy: Secondary | ICD-10-CM

## 2011-01-29 DIAGNOSIS — C4359 Malignant melanoma of other part of trunk: Secondary | ICD-10-CM

## 2011-01-29 LAB — CBC WITH DIFFERENTIAL (CANCER CENTER ONLY)
BASO#: 0 10*3/uL (ref 0.0–0.2)
BASO%: 0 % (ref 0.0–2.0)
EOS%: 1.5 % (ref 0.0–7.0)
Eosinophils Absolute: 0 10*3/uL (ref 0.0–0.5)
HCT: 37.2 % (ref 34.8–46.6)
HGB: 12.1 g/dL (ref 11.6–15.9)
LYMPH#: 1.2 10*3/uL (ref 0.9–3.3)
LYMPH%: 42.7 % (ref 14.0–48.0)
MCH: 28.6 pg (ref 26.0–34.0)
MCHC: 32.5 g/dL (ref 32.0–36.0)
MCV: 88 fL (ref 81–101)
MONO#: 0.3 10*3/uL (ref 0.1–0.9)
MONO%: 11.7 % (ref 0.0–13.0)
NEUT#: 1.2 10*3/uL — ABNORMAL LOW (ref 1.5–6.5)
NEUT%: 44.1 % (ref 39.6–80.0)
Platelets: 118 10*3/uL — ABNORMAL LOW (ref 145–400)
RBC: 4.23 10*6/uL (ref 3.70–5.32)
RDW: 14 % (ref 11.1–15.7)
WBC: 2.7 10*3/uL — ABNORMAL LOW (ref 3.9–10.0)

## 2011-01-29 LAB — CMP (CANCER CENTER ONLY)
ALT(SGPT): 244 U/L — ABNORMAL HIGH (ref 10–47)
AST: 112 U/L — ABNORMAL HIGH (ref 11–38)
Albumin: 3.4 g/dL (ref 3.3–5.5)
Alkaline Phosphatase: 46 U/L (ref 26–84)
BUN, Bld: 13 mg/dL (ref 7–22)
CO2: 30 mEq/L (ref 18–33)
Calcium: 9.2 mg/dL (ref 8.0–10.3)
Chloride: 98 mEq/L (ref 98–108)
Creat: 0.4 mg/dl — ABNORMAL LOW (ref 0.6–1.2)
Glucose, Bld: 108 mg/dL (ref 73–118)
Potassium: 3.8 mEq/L (ref 3.3–4.7)
Sodium: 142 mEq/L (ref 128–145)
Total Bilirubin: 0.6 mg/dl (ref 0.20–1.60)
Total Protein: 6.5 g/dL (ref 6.4–8.1)

## 2011-01-30 ENCOUNTER — Encounter (HOSPITAL_BASED_OUTPATIENT_CLINIC_OR_DEPARTMENT_OTHER): Payer: Private Health Insurance - Indemnity | Admitting: Hematology & Oncology

## 2011-01-30 ENCOUNTER — Other Ambulatory Visit: Payer: Self-pay | Admitting: Family

## 2011-01-30 DIAGNOSIS — C4359 Malignant melanoma of other part of trunk: Secondary | ICD-10-CM

## 2011-01-30 DIAGNOSIS — Z5112 Encounter for antineoplastic immunotherapy: Secondary | ICD-10-CM

## 2011-01-30 LAB — CMP (CANCER CENTER ONLY)
ALT(SGPT): 186 U/L — ABNORMAL HIGH (ref 10–47)
AST: 70 U/L — ABNORMAL HIGH (ref 11–38)
Albumin: 3.1 g/dL — ABNORMAL LOW (ref 3.3–5.5)
Alkaline Phosphatase: 44 U/L (ref 26–84)
BUN, Bld: 16 mg/dL (ref 7–22)
CO2: 30 mEq/L (ref 18–33)
Calcium: 9 mg/dL (ref 8.0–10.3)
Chloride: 101 mEq/L (ref 98–108)
Creat: 0.5 mg/dl — ABNORMAL LOW (ref 0.6–1.2)
Glucose, Bld: 120 mg/dL — ABNORMAL HIGH (ref 73–118)
Potassium: 3.2 mEq/L — ABNORMAL LOW (ref 3.3–4.7)
Sodium: 140 mEq/L (ref 128–145)
Total Bilirubin: 0.8 mg/dl (ref 0.20–1.60)
Total Protein: 6 g/dL — ABNORMAL LOW (ref 6.4–8.1)

## 2011-01-31 ENCOUNTER — Encounter (HOSPITAL_BASED_OUTPATIENT_CLINIC_OR_DEPARTMENT_OTHER): Payer: Private Health Insurance - Indemnity | Admitting: Hematology & Oncology

## 2011-01-31 DIAGNOSIS — C4359 Malignant melanoma of other part of trunk: Secondary | ICD-10-CM

## 2011-01-31 DIAGNOSIS — Z5112 Encounter for antineoplastic immunotherapy: Secondary | ICD-10-CM

## 2011-02-01 ENCOUNTER — Encounter (HOSPITAL_BASED_OUTPATIENT_CLINIC_OR_DEPARTMENT_OTHER): Payer: Private Health Insurance - Indemnity | Admitting: Hematology & Oncology

## 2011-02-02 ENCOUNTER — Encounter (HOSPITAL_BASED_OUTPATIENT_CLINIC_OR_DEPARTMENT_OTHER): Payer: Private Health Insurance - Indemnity | Admitting: Hematology & Oncology

## 2011-02-02 ENCOUNTER — Other Ambulatory Visit: Payer: Self-pay | Admitting: Hematology & Oncology

## 2011-02-02 DIAGNOSIS — C4359 Malignant melanoma of other part of trunk: Secondary | ICD-10-CM

## 2011-02-02 DIAGNOSIS — Z5112 Encounter for antineoplastic immunotherapy: Secondary | ICD-10-CM

## 2011-02-02 LAB — BASIC METABOLIC PANEL - CANCER CENTER ONLY
BUN, Bld: 11 mg/dL (ref 7–22)
CO2: 28 mEq/L (ref 18–33)
Calcium: 8.7 mg/dL (ref 8.0–10.3)
Chloride: 104 mEq/L (ref 98–108)
Creat: 0.5 mg/dl — ABNORMAL LOW (ref 0.6–1.2)
Glucose, Bld: 100 mg/dL (ref 73–118)
Potassium: 4 mEq/L (ref 3.3–4.7)
Sodium: 141 mEq/L (ref 128–145)

## 2011-02-05 ENCOUNTER — Other Ambulatory Visit: Payer: Self-pay | Admitting: Hematology & Oncology

## 2011-02-05 ENCOUNTER — Ambulatory Visit: Payer: Private Health Insurance - Indemnity | Attending: General Surgery | Admitting: Physical Therapy

## 2011-02-05 ENCOUNTER — Encounter: Payer: Private Health Insurance - Indemnity | Admitting: Rehabilitation

## 2011-02-05 ENCOUNTER — Encounter (HOSPITAL_BASED_OUTPATIENT_CLINIC_OR_DEPARTMENT_OTHER): Payer: Private Health Insurance - Indemnity | Admitting: Hematology & Oncology

## 2011-02-05 DIAGNOSIS — IMO0001 Reserved for inherently not codable concepts without codable children: Secondary | ICD-10-CM | POA: Insufficient documentation

## 2011-02-05 DIAGNOSIS — Z5112 Encounter for antineoplastic immunotherapy: Secondary | ICD-10-CM

## 2011-02-05 DIAGNOSIS — C4359 Malignant melanoma of other part of trunk: Secondary | ICD-10-CM

## 2011-02-05 DIAGNOSIS — M25619 Stiffness of unspecified shoulder, not elsewhere classified: Secondary | ICD-10-CM | POA: Insufficient documentation

## 2011-02-05 LAB — CMP (CANCER CENTER ONLY)
ALT(SGPT): 220 U/L — ABNORMAL HIGH (ref 10–47)
AST: 71 U/L — ABNORMAL HIGH (ref 11–38)
Albumin: 3.3 g/dL (ref 3.3–5.5)
Alkaline Phosphatase: 61 U/L (ref 26–84)
BUN, Bld: 15 mg/dL (ref 7–22)
CO2: 29 mEq/L (ref 18–33)
Calcium: 9.3 mg/dL (ref 8.0–10.3)
Chloride: 102 mEq/L (ref 98–108)
Creat: 0.5 mg/dl — ABNORMAL LOW (ref 0.6–1.2)
Glucose, Bld: 108 mg/dL (ref 73–118)
Potassium: 3.7 mEq/L (ref 3.3–4.7)
Sodium: 141 mEq/L (ref 128–145)
Total Bilirubin: 0.8 mg/dl (ref 0.20–1.60)
Total Protein: 6.2 g/dL — ABNORMAL LOW (ref 6.4–8.1)

## 2011-02-05 LAB — CBC WITH DIFFERENTIAL (CANCER CENTER ONLY)
BASO#: 0 10*3/uL (ref 0.0–0.2)
BASO%: 0 % (ref 0.0–2.0)
EOS%: 0.4 % (ref 0.0–7.0)
Eosinophils Absolute: 0 10*3/uL (ref 0.0–0.5)
HCT: 34 % — ABNORMAL LOW (ref 34.8–46.6)
HGB: 11.4 g/dL — ABNORMAL LOW (ref 11.6–15.9)
LYMPH#: 1 10*3/uL (ref 0.9–3.3)
LYMPH%: 40.2 % (ref 14.0–48.0)
MCH: 29 pg (ref 26.0–34.0)
MCHC: 33.5 g/dL (ref 32.0–36.0)
MCV: 87 fL (ref 81–101)
MONO#: 0.3 10*3/uL (ref 0.1–0.9)
MONO%: 11.7 % (ref 0.0–13.0)
NEUT#: 1.1 10*3/uL — ABNORMAL LOW (ref 1.5–6.5)
NEUT%: 47.7 % (ref 39.6–80.0)
Platelets: 107 10*3/uL — ABNORMAL LOW (ref 145–400)
RBC: 3.93 10*6/uL (ref 3.70–5.32)
RDW: 13.9 % (ref 11.1–15.7)
WBC: 2.4 10*3/uL — ABNORMAL LOW (ref 3.9–10.0)

## 2011-02-05 LAB — LACTATE DEHYDROGENASE: LDH: 231 U/L (ref 94–250)

## 2011-02-05 LAB — TSH: TSH: 1.681 u[IU]/mL (ref 0.350–4.500)

## 2011-02-06 ENCOUNTER — Encounter (HOSPITAL_BASED_OUTPATIENT_CLINIC_OR_DEPARTMENT_OTHER): Payer: Private Health Insurance - Indemnity | Admitting: Hematology & Oncology

## 2011-02-06 DIAGNOSIS — C4359 Malignant melanoma of other part of trunk: Secondary | ICD-10-CM

## 2011-02-06 DIAGNOSIS — Z5112 Encounter for antineoplastic immunotherapy: Secondary | ICD-10-CM

## 2011-02-07 ENCOUNTER — Encounter (HOSPITAL_BASED_OUTPATIENT_CLINIC_OR_DEPARTMENT_OTHER): Payer: Private Health Insurance - Indemnity | Admitting: Hematology & Oncology

## 2011-02-07 DIAGNOSIS — C4359 Malignant melanoma of other part of trunk: Secondary | ICD-10-CM

## 2011-02-08 ENCOUNTER — Encounter (HOSPITAL_BASED_OUTPATIENT_CLINIC_OR_DEPARTMENT_OTHER): Payer: Private Health Insurance - Indemnity | Admitting: Hematology & Oncology

## 2011-02-08 DIAGNOSIS — C4359 Malignant melanoma of other part of trunk: Secondary | ICD-10-CM

## 2011-02-09 ENCOUNTER — Encounter (HOSPITAL_BASED_OUTPATIENT_CLINIC_OR_DEPARTMENT_OTHER): Payer: Private Health Insurance - Indemnity | Admitting: Hematology & Oncology

## 2011-02-09 DIAGNOSIS — C4359 Malignant melanoma of other part of trunk: Secondary | ICD-10-CM

## 2011-02-09 DIAGNOSIS — Z5112 Encounter for antineoplastic immunotherapy: Secondary | ICD-10-CM

## 2011-02-12 ENCOUNTER — Ambulatory Visit: Payer: Private Health Insurance - Indemnity | Admitting: Rehabilitation

## 2011-02-19 ENCOUNTER — Ambulatory Visit: Payer: Private Health Insurance - Indemnity | Admitting: Rehabilitation

## 2011-02-21 ENCOUNTER — Other Ambulatory Visit: Payer: Self-pay | Admitting: Dermatology

## 2011-02-26 ENCOUNTER — Ambulatory Visit: Payer: Private Health Insurance - Indemnity | Admitting: Physical Therapy

## 2011-02-28 ENCOUNTER — Ambulatory Visit: Payer: Private Health Insurance - Indemnity | Admitting: Rehabilitation

## 2011-03-05 ENCOUNTER — Ambulatory Visit: Payer: Private Health Insurance - Indemnity | Admitting: Rehabilitation

## 2011-03-07 ENCOUNTER — Ambulatory Visit: Payer: Private Health Insurance - Indemnity | Attending: General Surgery | Admitting: Physical Therapy

## 2011-03-07 DIAGNOSIS — M25619 Stiffness of unspecified shoulder, not elsewhere classified: Secondary | ICD-10-CM | POA: Insufficient documentation

## 2011-03-07 DIAGNOSIS — IMO0001 Reserved for inherently not codable concepts without codable children: Secondary | ICD-10-CM | POA: Insufficient documentation

## 2011-03-12 ENCOUNTER — Other Ambulatory Visit: Payer: Self-pay | Admitting: Hematology & Oncology

## 2011-03-12 ENCOUNTER — Encounter (HOSPITAL_BASED_OUTPATIENT_CLINIC_OR_DEPARTMENT_OTHER): Payer: Private Health Insurance - Indemnity | Admitting: Hematology & Oncology

## 2011-03-12 DIAGNOSIS — C4359 Malignant melanoma of other part of trunk: Secondary | ICD-10-CM

## 2011-03-12 DIAGNOSIS — Z5112 Encounter for antineoplastic immunotherapy: Secondary | ICD-10-CM

## 2011-03-12 LAB — COMPREHENSIVE METABOLIC PANEL
ALT: 18 U/L (ref 0–35)
AST: 14 U/L (ref 0–37)
Albumin: 3.8 g/dL (ref 3.5–5.2)
Alkaline Phosphatase: 57 U/L (ref 39–117)
BUN: 17 mg/dL (ref 6–23)
CO2: 27 mEq/L (ref 19–32)
Calcium: 9.4 mg/dL (ref 8.4–10.5)
Chloride: 104 mEq/L (ref 96–112)
Creatinine, Ser: 0.56 mg/dL (ref 0.40–1.20)
Glucose, Bld: 90 mg/dL (ref 70–99)
Potassium: 3.3 mEq/L — ABNORMAL LOW (ref 3.5–5.3)
Sodium: 144 mEq/L (ref 135–145)
Total Bilirubin: 0.4 mg/dL (ref 0.3–1.2)
Total Protein: 6.3 g/dL (ref 6.0–8.3)

## 2011-03-12 LAB — CBC WITH DIFFERENTIAL (CANCER CENTER ONLY)
BASO#: 0 10*3/uL (ref 0.0–0.2)
BASO%: 0.4 % (ref 0.0–2.0)
EOS%: 5.9 % (ref 0.0–7.0)
Eosinophils Absolute: 0.3 10*3/uL (ref 0.0–0.5)
HCT: 32.5 % — ABNORMAL LOW (ref 34.8–46.6)
HGB: 10.8 g/dL — ABNORMAL LOW (ref 11.6–15.9)
LYMPH#: 1.3 10*3/uL (ref 0.9–3.3)
LYMPH%: 24.1 % (ref 14.0–48.0)
MCH: 29 pg (ref 26.0–34.0)
MCHC: 33.2 g/dL (ref 32.0–36.0)
MCV: 87 fL (ref 81–101)
MONO#: 0.6 10*3/uL (ref 0.1–0.9)
MONO%: 10.8 % (ref 0.0–13.0)
NEUT#: 3.2 10*3/uL (ref 1.5–6.5)
NEUT%: 58.8 % (ref 39.6–80.0)
Platelets: 229 10*3/uL (ref 145–400)
RBC: 3.72 10*6/uL (ref 3.70–5.32)
RDW: 14.7 % (ref 11.1–15.7)
WBC: 5.4 10*3/uL (ref 3.9–10.0)

## 2011-03-12 LAB — TSH: TSH: 2.29 u[IU]/mL (ref 0.350–4.500)

## 2011-03-12 LAB — LACTATE DEHYDROGENASE: LDH: 189 U/L (ref 94–250)

## 2011-03-13 ENCOUNTER — Encounter (HOSPITAL_BASED_OUTPATIENT_CLINIC_OR_DEPARTMENT_OTHER): Payer: Private Health Insurance - Indemnity | Admitting: Hematology & Oncology

## 2011-03-13 DIAGNOSIS — Z5112 Encounter for antineoplastic immunotherapy: Secondary | ICD-10-CM

## 2011-03-13 DIAGNOSIS — C4359 Malignant melanoma of other part of trunk: Secondary | ICD-10-CM

## 2011-03-14 ENCOUNTER — Encounter (HOSPITAL_BASED_OUTPATIENT_CLINIC_OR_DEPARTMENT_OTHER): Payer: Private Health Insurance - Indemnity | Admitting: Hematology & Oncology

## 2011-03-14 DIAGNOSIS — C4359 Malignant melanoma of other part of trunk: Secondary | ICD-10-CM

## 2011-03-15 ENCOUNTER — Encounter (HOSPITAL_BASED_OUTPATIENT_CLINIC_OR_DEPARTMENT_OTHER): Payer: Private Health Insurance - Indemnity | Admitting: Hematology & Oncology

## 2011-03-15 DIAGNOSIS — C4359 Malignant melanoma of other part of trunk: Secondary | ICD-10-CM

## 2011-03-15 DIAGNOSIS — Z5112 Encounter for antineoplastic immunotherapy: Secondary | ICD-10-CM

## 2011-03-16 ENCOUNTER — Encounter (HOSPITAL_BASED_OUTPATIENT_CLINIC_OR_DEPARTMENT_OTHER): Payer: Private Health Insurance - Indemnity | Admitting: Hematology & Oncology

## 2011-03-16 DIAGNOSIS — Z5112 Encounter for antineoplastic immunotherapy: Secondary | ICD-10-CM

## 2011-03-16 DIAGNOSIS — C4359 Malignant melanoma of other part of trunk: Secondary | ICD-10-CM

## 2011-03-19 ENCOUNTER — Ambulatory Visit: Payer: Private Health Insurance - Indemnity | Admitting: Rehabilitation

## 2011-03-19 ENCOUNTER — Other Ambulatory Visit: Payer: Self-pay | Admitting: Hematology & Oncology

## 2011-03-19 ENCOUNTER — Encounter (HOSPITAL_BASED_OUTPATIENT_CLINIC_OR_DEPARTMENT_OTHER): Payer: Private Health Insurance - Indemnity | Admitting: Hematology & Oncology

## 2011-03-19 DIAGNOSIS — C4359 Malignant melanoma of other part of trunk: Secondary | ICD-10-CM

## 2011-03-19 DIAGNOSIS — Z5112 Encounter for antineoplastic immunotherapy: Secondary | ICD-10-CM

## 2011-03-19 LAB — BASIC METABOLIC PANEL - CANCER CENTER ONLY
BUN, Bld: 16 mg/dL (ref 7–22)
CO2: 30 mEq/L (ref 18–33)
Calcium: 9.9 mg/dL (ref 8.0–10.3)
Chloride: 99 mEq/L (ref 98–108)
Creat: 0.3 mg/dl — ABNORMAL LOW (ref 0.6–1.2)
Glucose, Bld: 91 mg/dL (ref 73–118)
Potassium: 4 mEq/L (ref 3.3–4.7)
Sodium: 143 mEq/L (ref 128–145)

## 2011-03-19 LAB — CBC WITH DIFFERENTIAL (CANCER CENTER ONLY)
BASO#: 0 10*3/uL (ref 0.0–0.2)
BASO%: 0 % (ref 0.0–2.0)
EOS%: 1.3 % (ref 0.0–7.0)
Eosinophils Absolute: 0.1 10*3/uL (ref 0.0–0.5)
HCT: 36.9 % (ref 34.8–46.6)
HGB: 12.2 g/dL (ref 11.6–15.9)
LYMPH#: 1.9 10*3/uL (ref 0.9–3.3)
LYMPH%: 48.9 % — ABNORMAL HIGH (ref 14.0–48.0)
MCH: 29 pg (ref 26.0–34.0)
MCHC: 33.1 g/dL (ref 32.0–36.0)
MCV: 88 fL (ref 81–101)
MONO#: 0.4 10*3/uL (ref 0.1–0.9)
MONO%: 10.6 % (ref 0.0–13.0)
NEUT#: 1.5 10*3/uL (ref 1.5–6.5)
NEUT%: 39.2 % — ABNORMAL LOW (ref 39.6–80.0)
Platelets: 120 10*3/uL — ABNORMAL LOW (ref 145–400)
RBC: 4.21 10*6/uL (ref 3.70–5.32)
RDW: 14.6 % (ref 11.1–15.7)
WBC: 3.8 10*3/uL — ABNORMAL LOW (ref 3.9–10.0)

## 2011-03-20 ENCOUNTER — Encounter (HOSPITAL_BASED_OUTPATIENT_CLINIC_OR_DEPARTMENT_OTHER): Payer: Private Health Insurance - Indemnity | Admitting: Hematology & Oncology

## 2011-03-20 DIAGNOSIS — Z5112 Encounter for antineoplastic immunotherapy: Secondary | ICD-10-CM

## 2011-03-20 DIAGNOSIS — C4359 Malignant melanoma of other part of trunk: Secondary | ICD-10-CM

## 2011-03-21 ENCOUNTER — Encounter (HOSPITAL_BASED_OUTPATIENT_CLINIC_OR_DEPARTMENT_OTHER): Payer: Private Health Insurance - Indemnity | Admitting: Hematology & Oncology

## 2011-03-21 DIAGNOSIS — C4359 Malignant melanoma of other part of trunk: Secondary | ICD-10-CM

## 2011-03-22 ENCOUNTER — Encounter (HOSPITAL_BASED_OUTPATIENT_CLINIC_OR_DEPARTMENT_OTHER): Payer: Private Health Insurance - Indemnity | Admitting: Hematology & Oncology

## 2011-03-22 DIAGNOSIS — C4359 Malignant melanoma of other part of trunk: Secondary | ICD-10-CM

## 2011-03-22 DIAGNOSIS — Z5112 Encounter for antineoplastic immunotherapy: Secondary | ICD-10-CM

## 2011-03-23 ENCOUNTER — Encounter (HOSPITAL_BASED_OUTPATIENT_CLINIC_OR_DEPARTMENT_OTHER): Payer: Private Health Insurance - Indemnity | Admitting: Hematology & Oncology

## 2011-03-23 DIAGNOSIS — C4359 Malignant melanoma of other part of trunk: Secondary | ICD-10-CM

## 2011-03-23 DIAGNOSIS — Z5112 Encounter for antineoplastic immunotherapy: Secondary | ICD-10-CM

## 2011-03-26 ENCOUNTER — Other Ambulatory Visit: Payer: Self-pay | Admitting: Family

## 2011-03-26 ENCOUNTER — Encounter (HOSPITAL_BASED_OUTPATIENT_CLINIC_OR_DEPARTMENT_OTHER): Payer: Private Health Insurance - Indemnity | Admitting: Hematology & Oncology

## 2011-03-26 ENCOUNTER — Ambulatory Visit: Payer: Private Health Insurance - Indemnity | Admitting: Rehabilitation

## 2011-03-26 DIAGNOSIS — C4359 Malignant melanoma of other part of trunk: Secondary | ICD-10-CM

## 2011-03-26 LAB — CBC WITH DIFFERENTIAL (CANCER CENTER ONLY)
BASO#: 0 10*3/uL (ref 0.0–0.2)
BASO%: 0 % (ref 0.0–2.0)
EOS%: 0.9 % (ref 0.0–7.0)
Eosinophils Absolute: 0 10*3/uL (ref 0.0–0.5)
HCT: 37.4 % (ref 34.8–46.6)
HGB: 12.3 g/dL (ref 11.6–15.9)
LYMPH#: 1.5 10*3/uL (ref 0.9–3.3)
LYMPH%: 45.8 % (ref 14.0–48.0)
MCH: 28.6 pg (ref 26.0–34.0)
MCHC: 32.9 g/dL (ref 32.0–36.0)
MCV: 87 fL (ref 81–101)
MONO#: 0.3 10*3/uL (ref 0.1–0.9)
MONO%: 10.5 % (ref 0.0–13.0)
NEUT#: 1.4 10*3/uL — ABNORMAL LOW (ref 1.5–6.5)
NEUT%: 42.8 % (ref 39.6–80.0)
Platelets: 117 10*3/uL — ABNORMAL LOW (ref 145–400)
RBC: 4.3 10*6/uL (ref 3.70–5.32)
RDW: 14.5 % (ref 11.1–15.7)
WBC: 3.3 10*3/uL — ABNORMAL LOW (ref 3.9–10.0)

## 2011-03-26 LAB — COMPREHENSIVE METABOLIC PANEL
ALT: 32 U/L (ref 0–35)
AST: 17 U/L (ref 0–37)
Albumin: 3.7 g/dL (ref 3.5–5.2)
Alkaline Phosphatase: 47 U/L (ref 39–117)
BUN: 16 mg/dL (ref 6–23)
CO2: 26 mEq/L (ref 19–32)
Calcium: 9.3 mg/dL (ref 8.4–10.5)
Chloride: 106 mEq/L (ref 96–112)
Creatinine, Ser: 0.58 mg/dL (ref 0.40–1.20)
Glucose, Bld: 80 mg/dL (ref 70–99)
Potassium: 3.5 mEq/L (ref 3.5–5.3)
Sodium: 141 mEq/L (ref 135–145)
Total Bilirubin: 0.5 mg/dL (ref 0.3–1.2)
Total Protein: 6 g/dL (ref 6.0–8.3)

## 2011-03-27 ENCOUNTER — Encounter: Payer: Private Health Insurance - Indemnity | Admitting: Hematology & Oncology

## 2011-03-28 ENCOUNTER — Encounter (HOSPITAL_BASED_OUTPATIENT_CLINIC_OR_DEPARTMENT_OTHER): Payer: Private Health Insurance - Indemnity | Admitting: Hematology & Oncology

## 2011-03-28 DIAGNOSIS — Z5112 Encounter for antineoplastic immunotherapy: Secondary | ICD-10-CM

## 2011-03-28 DIAGNOSIS — C4359 Malignant melanoma of other part of trunk: Secondary | ICD-10-CM

## 2011-03-29 ENCOUNTER — Encounter (HOSPITAL_BASED_OUTPATIENT_CLINIC_OR_DEPARTMENT_OTHER): Payer: Private Health Insurance - Indemnity | Admitting: Hematology & Oncology

## 2011-03-29 DIAGNOSIS — C4359 Malignant melanoma of other part of trunk: Secondary | ICD-10-CM

## 2011-03-29 DIAGNOSIS — Z5112 Encounter for antineoplastic immunotherapy: Secondary | ICD-10-CM

## 2011-03-30 ENCOUNTER — Encounter (HOSPITAL_BASED_OUTPATIENT_CLINIC_OR_DEPARTMENT_OTHER): Payer: Private Health Insurance - Indemnity | Admitting: Hematology & Oncology

## 2011-03-30 ENCOUNTER — Other Ambulatory Visit: Payer: Self-pay | Admitting: Hematology & Oncology

## 2011-03-30 DIAGNOSIS — R112 Nausea with vomiting, unspecified: Secondary | ICD-10-CM

## 2011-03-30 DIAGNOSIS — Z5112 Encounter for antineoplastic immunotherapy: Secondary | ICD-10-CM

## 2011-03-30 DIAGNOSIS — C4359 Malignant melanoma of other part of trunk: Secondary | ICD-10-CM

## 2011-03-30 LAB — BASIC METABOLIC PANEL - CANCER CENTER ONLY
BUN, Bld: 18 mg/dL (ref 7–22)
CO2: 30 mEq/L (ref 18–33)
Calcium: 8.6 mg/dL (ref 8.0–10.3)
Chloride: 107 mEq/L (ref 98–108)
Creat: 0.7 mg/dl (ref 0.6–1.2)
Glucose, Bld: 104 mg/dL (ref 73–118)
Potassium: 3.6 mEq/L (ref 3.3–4.7)
Sodium: 140 mEq/L (ref 128–145)

## 2011-04-03 ENCOUNTER — Other Ambulatory Visit: Payer: Self-pay | Admitting: Hematology & Oncology

## 2011-04-03 ENCOUNTER — Encounter (HOSPITAL_BASED_OUTPATIENT_CLINIC_OR_DEPARTMENT_OTHER): Payer: Private Health Insurance - Indemnity | Admitting: Hematology & Oncology

## 2011-04-03 DIAGNOSIS — C4359 Malignant melanoma of other part of trunk: Secondary | ICD-10-CM

## 2011-04-03 DIAGNOSIS — Z5112 Encounter for antineoplastic immunotherapy: Secondary | ICD-10-CM

## 2011-04-03 LAB — CMP (CANCER CENTER ONLY)
ALT(SGPT): 26 U/L (ref 10–47)
AST: 19 U/L (ref 11–38)
Albumin: 3.6 g/dL (ref 3.3–5.5)
Alkaline Phosphatase: 42 U/L (ref 26–84)
BUN, Bld: 16 mg/dL (ref 7–22)
CO2: 29 mEq/L (ref 18–33)
Calcium: 8.9 mg/dL (ref 8.0–10.3)
Chloride: 105 mEq/L (ref 98–108)
Creat: 0.5 mg/dl — ABNORMAL LOW (ref 0.6–1.2)
Glucose, Bld: 98 mg/dL (ref 73–118)
Potassium: 3.5 mEq/L (ref 3.3–4.7)
Sodium: 142 mEq/L (ref 128–145)
Total Bilirubin: 0.7 mg/dl (ref 0.20–1.60)
Total Protein: 6.4 g/dL (ref 6.4–8.1)

## 2011-04-03 LAB — CBC WITH DIFFERENTIAL (CANCER CENTER ONLY)
BASO#: 0 10*3/uL (ref 0.0–0.2)
BASO%: 0 % (ref 0.0–2.0)
EOS%: 1.7 % (ref 0.0–7.0)
Eosinophils Absolute: 0.1 10*3/uL (ref 0.0–0.5)
HCT: 33.2 % — ABNORMAL LOW (ref 34.8–46.6)
HGB: 11.1 g/dL — ABNORMAL LOW (ref 11.6–15.9)
LYMPH#: 1 10*3/uL (ref 0.9–3.3)
LYMPH%: 35.9 % (ref 14.0–48.0)
MCH: 28.9 pg (ref 26.0–34.0)
MCHC: 33.4 g/dL (ref 32.0–36.0)
MCV: 87 fL (ref 81–101)
MONO#: 0.4 10*3/uL (ref 0.1–0.9)
MONO%: 14.6 % — ABNORMAL HIGH (ref 0.0–13.0)
NEUT#: 1.4 10*3/uL — ABNORMAL LOW (ref 1.5–6.5)
NEUT%: 47.8 % (ref 39.6–80.0)
Platelets: 97 10*3/uL — ABNORMAL LOW (ref 145–400)
RBC: 3.84 10*6/uL (ref 3.70–5.32)
RDW: 13.8 % (ref 11.1–15.7)
WBC: 2.9 10*3/uL — ABNORMAL LOW (ref 3.9–10.0)

## 2011-04-03 LAB — LACTATE DEHYDROGENASE: LDH: 192 U/L (ref 94–250)

## 2011-04-04 ENCOUNTER — Encounter (HOSPITAL_BASED_OUTPATIENT_CLINIC_OR_DEPARTMENT_OTHER): Payer: Private Health Insurance - Indemnity | Admitting: Hematology & Oncology

## 2011-04-04 DIAGNOSIS — C4359 Malignant melanoma of other part of trunk: Secondary | ICD-10-CM

## 2011-04-04 DIAGNOSIS — Z5112 Encounter for antineoplastic immunotherapy: Secondary | ICD-10-CM

## 2011-04-05 ENCOUNTER — Encounter (HOSPITAL_BASED_OUTPATIENT_CLINIC_OR_DEPARTMENT_OTHER): Payer: Private Health Insurance - Indemnity | Admitting: Hematology & Oncology

## 2011-04-05 DIAGNOSIS — Z5112 Encounter for antineoplastic immunotherapy: Secondary | ICD-10-CM

## 2011-04-05 DIAGNOSIS — C4359 Malignant melanoma of other part of trunk: Secondary | ICD-10-CM

## 2011-04-06 ENCOUNTER — Encounter (HOSPITAL_BASED_OUTPATIENT_CLINIC_OR_DEPARTMENT_OTHER): Payer: Private Health Insurance - Indemnity | Admitting: Hematology & Oncology

## 2011-04-06 DIAGNOSIS — C4359 Malignant melanoma of other part of trunk: Secondary | ICD-10-CM

## 2011-05-14 ENCOUNTER — Other Ambulatory Visit: Payer: Self-pay | Admitting: Family

## 2011-05-14 ENCOUNTER — Encounter (HOSPITAL_BASED_OUTPATIENT_CLINIC_OR_DEPARTMENT_OTHER): Payer: Private Health Insurance - Indemnity | Admitting: Hematology & Oncology

## 2011-05-14 DIAGNOSIS — Z5112 Encounter for antineoplastic immunotherapy: Secondary | ICD-10-CM

## 2011-05-14 DIAGNOSIS — C4359 Malignant melanoma of other part of trunk: Secondary | ICD-10-CM

## 2011-05-14 DIAGNOSIS — R112 Nausea with vomiting, unspecified: Secondary | ICD-10-CM

## 2011-05-14 DIAGNOSIS — Z5111 Encounter for antineoplastic chemotherapy: Secondary | ICD-10-CM

## 2011-05-14 LAB — COMPREHENSIVE METABOLIC PANEL
ALT: 11 U/L (ref 0–35)
AST: 10 U/L (ref 0–37)
Albumin: 3.9 g/dL (ref 3.5–5.2)
Alkaline Phosphatase: 50 U/L (ref 39–117)
BUN: 20 mg/dL (ref 6–23)
CO2: 26 mEq/L (ref 19–32)
Calcium: 9.3 mg/dL (ref 8.4–10.5)
Chloride: 104 mEq/L (ref 96–112)
Creatinine, Ser: 0.58 mg/dL (ref 0.50–1.10)
Glucose, Bld: 89 mg/dL (ref 70–99)
Potassium: 3.4 mEq/L — ABNORMAL LOW (ref 3.5–5.3)
Sodium: 139 mEq/L (ref 135–145)
Total Bilirubin: 0.5 mg/dL (ref 0.3–1.2)
Total Protein: 6.3 g/dL (ref 6.0–8.3)

## 2011-05-14 LAB — CBC WITH DIFFERENTIAL (CANCER CENTER ONLY)
BASO#: 0 10*3/uL (ref 0.0–0.2)
BASO%: 0.2 % (ref 0.0–2.0)
EOS%: 4.3 % (ref 0.0–7.0)
Eosinophils Absolute: 0.2 10*3/uL (ref 0.0–0.5)
HCT: 35 % (ref 34.8–46.6)
HGB: 11.8 g/dL (ref 11.6–15.9)
LYMPH#: 1.6 10*3/uL (ref 0.9–3.3)
LYMPH%: 29.6 % (ref 14.0–48.0)
MCH: 30.3 pg (ref 26.0–34.0)
MCHC: 33.7 g/dL (ref 32.0–36.0)
MCV: 90 fL (ref 81–101)
MONO#: 0.5 10*3/uL (ref 0.1–0.9)
MONO%: 9.1 % (ref 0.0–13.0)
NEUT#: 3.1 10*3/uL (ref 1.5–6.5)
NEUT%: 56.8 % (ref 39.6–80.0)
Platelets: 181 10*3/uL (ref 145–400)
RBC: 3.89 10*6/uL (ref 3.70–5.32)
RDW: 14.4 % (ref 11.1–15.7)
WBC: 5.4 10*3/uL (ref 3.9–10.0)

## 2011-05-15 ENCOUNTER — Encounter (HOSPITAL_BASED_OUTPATIENT_CLINIC_OR_DEPARTMENT_OTHER): Payer: Private Health Insurance - Indemnity | Admitting: Hematology & Oncology

## 2011-05-15 DIAGNOSIS — Z5111 Encounter for antineoplastic chemotherapy: Secondary | ICD-10-CM

## 2011-05-15 DIAGNOSIS — C4359 Malignant melanoma of other part of trunk: Secondary | ICD-10-CM

## 2011-05-16 ENCOUNTER — Encounter (HOSPITAL_BASED_OUTPATIENT_CLINIC_OR_DEPARTMENT_OTHER): Payer: Private Health Insurance - Indemnity | Admitting: Hematology & Oncology

## 2011-05-16 DIAGNOSIS — Z5111 Encounter for antineoplastic chemotherapy: Secondary | ICD-10-CM

## 2011-05-16 DIAGNOSIS — C4359 Malignant melanoma of other part of trunk: Secondary | ICD-10-CM

## 2011-05-17 ENCOUNTER — Encounter (HOSPITAL_BASED_OUTPATIENT_CLINIC_OR_DEPARTMENT_OTHER): Payer: Private Health Insurance - Indemnity | Admitting: Hematology & Oncology

## 2011-05-17 DIAGNOSIS — C4359 Malignant melanoma of other part of trunk: Secondary | ICD-10-CM

## 2011-05-17 DIAGNOSIS — Z5111 Encounter for antineoplastic chemotherapy: Secondary | ICD-10-CM

## 2011-05-18 ENCOUNTER — Encounter (HOSPITAL_BASED_OUTPATIENT_CLINIC_OR_DEPARTMENT_OTHER): Payer: Private Health Insurance - Indemnity | Admitting: Hematology & Oncology

## 2011-05-18 DIAGNOSIS — Z5111 Encounter for antineoplastic chemotherapy: Secondary | ICD-10-CM

## 2011-05-18 DIAGNOSIS — C4359 Malignant melanoma of other part of trunk: Secondary | ICD-10-CM

## 2011-05-21 ENCOUNTER — Other Ambulatory Visit: Payer: Self-pay | Admitting: Hematology & Oncology

## 2011-05-21 ENCOUNTER — Encounter (HOSPITAL_BASED_OUTPATIENT_CLINIC_OR_DEPARTMENT_OTHER): Payer: Private Health Insurance - Indemnity | Admitting: Hematology & Oncology

## 2011-05-21 DIAGNOSIS — Z5111 Encounter for antineoplastic chemotherapy: Secondary | ICD-10-CM

## 2011-05-21 DIAGNOSIS — C4359 Malignant melanoma of other part of trunk: Secondary | ICD-10-CM

## 2011-05-21 LAB — CMP (CANCER CENTER ONLY)
ALT(SGPT): 27 U/L (ref 10–47)
AST: 23 U/L (ref 11–38)
Albumin: 3.4 g/dL (ref 3.3–5.5)
Alkaline Phosphatase: 44 U/L (ref 26–84)
BUN, Bld: 20 mg/dL (ref 7–22)
CO2: 32 mEq/L (ref 18–33)
Calcium: 9.9 mg/dL (ref 8.0–10.3)
Chloride: 106 mEq/L (ref 98–108)
Creat: 0.6 mg/dl (ref 0.6–1.2)
Glucose, Bld: 100 mg/dL (ref 73–118)
Potassium: 3.9 mEq/L (ref 3.3–4.7)
Sodium: 145 mEq/L (ref 128–145)
Total Bilirubin: 0.3 mg/dl (ref 0.20–1.60)
Total Protein: 6.8 g/dL (ref 6.4–8.1)

## 2011-05-21 LAB — CBC WITH DIFFERENTIAL (CANCER CENTER ONLY)
BASO#: 0 10*3/uL (ref 0.0–0.2)
BASO%: 0 % (ref 0.0–2.0)
EOS%: 0.3 % (ref 0.0–7.0)
Eosinophils Absolute: 0 10*3/uL (ref 0.0–0.5)
HCT: 36.8 % (ref 34.8–46.6)
HGB: 12.5 g/dL (ref 11.6–15.9)
LYMPH#: 1.5 10*3/uL (ref 0.9–3.3)
LYMPH%: 48.2 % — ABNORMAL HIGH (ref 14.0–48.0)
MCH: 30.3 pg (ref 26.0–34.0)
MCHC: 34 g/dL (ref 32.0–36.0)
MCV: 89 fL (ref 81–101)
MONO#: 0.3 10*3/uL (ref 0.1–0.9)
MONO%: 9.9 % (ref 0.0–13.0)
NEUT#: 1.3 10*3/uL — ABNORMAL LOW (ref 1.5–6.5)
NEUT%: 41.6 % (ref 39.6–80.0)
Platelets: 122 10*3/uL — ABNORMAL LOW (ref 145–400)
RBC: 4.13 10*6/uL (ref 3.70–5.32)
RDW: 14 % (ref 11.1–15.7)
WBC: 3.1 10*3/uL — ABNORMAL LOW (ref 3.9–10.0)

## 2011-05-21 LAB — TSH: TSH: 1.325 u[IU]/mL (ref 0.350–4.500)

## 2011-05-22 ENCOUNTER — Encounter (HOSPITAL_BASED_OUTPATIENT_CLINIC_OR_DEPARTMENT_OTHER): Payer: Private Health Insurance - Indemnity | Admitting: Hematology & Oncology

## 2011-05-22 DIAGNOSIS — C4359 Malignant melanoma of other part of trunk: Secondary | ICD-10-CM

## 2011-05-22 DIAGNOSIS — Z5111 Encounter for antineoplastic chemotherapy: Secondary | ICD-10-CM

## 2011-05-23 ENCOUNTER — Encounter (HOSPITAL_BASED_OUTPATIENT_CLINIC_OR_DEPARTMENT_OTHER): Payer: Private Health Insurance - Indemnity | Admitting: Hematology & Oncology

## 2011-05-23 DIAGNOSIS — C4359 Malignant melanoma of other part of trunk: Secondary | ICD-10-CM

## 2011-05-23 DIAGNOSIS — Z5111 Encounter for antineoplastic chemotherapy: Secondary | ICD-10-CM

## 2011-05-24 ENCOUNTER — Encounter: Payer: Private Health Insurance - Indemnity | Admitting: Hematology & Oncology

## 2011-05-25 ENCOUNTER — Encounter (HOSPITAL_BASED_OUTPATIENT_CLINIC_OR_DEPARTMENT_OTHER): Payer: Private Health Insurance - Indemnity | Admitting: Hematology & Oncology

## 2011-05-25 DIAGNOSIS — C4359 Malignant melanoma of other part of trunk: Secondary | ICD-10-CM

## 2011-05-25 DIAGNOSIS — Z5111 Encounter for antineoplastic chemotherapy: Secondary | ICD-10-CM

## 2011-05-28 ENCOUNTER — Other Ambulatory Visit: Payer: Self-pay | Admitting: Hematology & Oncology

## 2011-05-28 ENCOUNTER — Encounter (HOSPITAL_BASED_OUTPATIENT_CLINIC_OR_DEPARTMENT_OTHER): Payer: Private Health Insurance - Indemnity | Admitting: Hematology & Oncology

## 2011-05-28 DIAGNOSIS — C4359 Malignant melanoma of other part of trunk: Secondary | ICD-10-CM

## 2011-05-28 DIAGNOSIS — Z5112 Encounter for antineoplastic immunotherapy: Secondary | ICD-10-CM

## 2011-05-28 DIAGNOSIS — C773 Secondary and unspecified malignant neoplasm of axilla and upper limb lymph nodes: Secondary | ICD-10-CM

## 2011-05-28 LAB — CBC WITH DIFFERENTIAL (CANCER CENTER ONLY)
BASO#: 0 10*3/uL (ref 0.0–0.2)
BASO%: 0 % (ref 0.0–2.0)
EOS%: 0.4 % (ref 0.0–7.0)
Eosinophils Absolute: 0 10*3/uL (ref 0.0–0.5)
HCT: 37.7 % (ref 34.8–46.6)
HGB: 12.8 g/dL (ref 11.6–15.9)
LYMPH#: 1.4 10*3/uL (ref 0.9–3.3)
LYMPH%: 50.5 % — ABNORMAL HIGH (ref 14.0–48.0)
MCH: 29.7 pg (ref 26.0–34.0)
MCHC: 34 g/dL (ref 32.0–36.0)
MCV: 88 fL (ref 81–101)
MONO#: 0.3 10*3/uL (ref 0.1–0.9)
MONO%: 11.5 % (ref 0.0–13.0)
NEUT#: 1.1 10*3/uL — ABNORMAL LOW (ref 1.5–6.5)
NEUT%: 37.6 % — ABNORMAL LOW (ref 39.6–80.0)
Platelets: 124 10*3/uL — ABNORMAL LOW (ref 145–400)
RBC: 4.31 10*6/uL (ref 3.70–5.32)
RDW: 13.8 % (ref 11.1–15.7)
WBC: 2.8 10*3/uL — ABNORMAL LOW (ref 3.9–10.0)

## 2011-05-28 LAB — CMP (CANCER CENTER ONLY)
ALT(SGPT): 25 U/L (ref 10–47)
AST: 19 U/L (ref 11–38)
Albumin: 3.4 g/dL (ref 3.3–5.5)
Alkaline Phosphatase: 38 U/L (ref 26–84)
BUN, Bld: 18 mg/dL (ref 7–22)
CO2: 29 mEq/L (ref 18–33)
Calcium: 9.1 mg/dL (ref 8.0–10.3)
Chloride: 103 mEq/L (ref 98–108)
Creat: 0.7 mg/dl (ref 0.6–1.2)
Glucose, Bld: 86 mg/dL (ref 73–118)
Potassium: 3.6 mEq/L (ref 3.3–4.7)
Sodium: 138 mEq/L (ref 128–145)
Total Bilirubin: 0.7 mg/dl (ref 0.20–1.60)
Total Protein: 6.3 g/dL — ABNORMAL LOW (ref 6.4–8.1)

## 2011-05-29 ENCOUNTER — Encounter (HOSPITAL_BASED_OUTPATIENT_CLINIC_OR_DEPARTMENT_OTHER): Payer: Private Health Insurance - Indemnity | Admitting: Hematology & Oncology

## 2011-05-29 DIAGNOSIS — C773 Secondary and unspecified malignant neoplasm of axilla and upper limb lymph nodes: Secondary | ICD-10-CM

## 2011-05-29 DIAGNOSIS — C4359 Malignant melanoma of other part of trunk: Secondary | ICD-10-CM

## 2011-05-29 DIAGNOSIS — Z5112 Encounter for antineoplastic immunotherapy: Secondary | ICD-10-CM

## 2011-05-31 ENCOUNTER — Encounter (HOSPITAL_BASED_OUTPATIENT_CLINIC_OR_DEPARTMENT_OTHER): Payer: Private Health Insurance - Indemnity | Admitting: Hematology & Oncology

## 2011-05-31 DIAGNOSIS — C4359 Malignant melanoma of other part of trunk: Secondary | ICD-10-CM

## 2011-05-31 DIAGNOSIS — Z5112 Encounter for antineoplastic immunotherapy: Secondary | ICD-10-CM

## 2011-06-01 ENCOUNTER — Encounter (HOSPITAL_BASED_OUTPATIENT_CLINIC_OR_DEPARTMENT_OTHER): Payer: Private Health Insurance - Indemnity | Admitting: Hematology & Oncology

## 2011-06-01 DIAGNOSIS — Z5112 Encounter for antineoplastic immunotherapy: Secondary | ICD-10-CM

## 2011-06-01 DIAGNOSIS — C4359 Malignant melanoma of other part of trunk: Secondary | ICD-10-CM

## 2011-06-04 ENCOUNTER — Other Ambulatory Visit: Payer: Self-pay | Admitting: Hematology & Oncology

## 2011-06-04 ENCOUNTER — Encounter (HOSPITAL_BASED_OUTPATIENT_CLINIC_OR_DEPARTMENT_OTHER): Payer: Private Health Insurance - Indemnity | Admitting: Hematology & Oncology

## 2011-06-04 DIAGNOSIS — C4359 Malignant melanoma of other part of trunk: Secondary | ICD-10-CM

## 2011-06-04 DIAGNOSIS — Z5112 Encounter for antineoplastic immunotherapy: Secondary | ICD-10-CM

## 2011-06-04 DIAGNOSIS — C4360 Malignant melanoma of unspecified upper limb, including shoulder: Secondary | ICD-10-CM

## 2011-06-04 LAB — CBC WITH DIFFERENTIAL (CANCER CENTER ONLY)
BASO#: 0 10*3/uL (ref 0.0–0.2)
BASO%: 0 % (ref 0.0–2.0)
EOS%: 0.3 % (ref 0.0–7.0)
Eosinophils Absolute: 0 10*3/uL (ref 0.0–0.5)
HCT: 34.2 % — ABNORMAL LOW (ref 34.8–46.6)
HGB: 11.6 g/dL (ref 11.6–15.9)
LYMPH#: 1.4 10*3/uL (ref 0.9–3.3)
LYMPH%: 45.8 % (ref 14.0–48.0)
MCH: 30 pg (ref 26.0–34.0)
MCHC: 33.9 g/dL (ref 32.0–36.0)
MCV: 88 fL (ref 81–101)
MONO#: 0.4 10*3/uL (ref 0.1–0.9)
MONO%: 14.2 % — ABNORMAL HIGH (ref 0.0–13.0)
NEUT#: 1.2 10*3/uL — ABNORMAL LOW (ref 1.5–6.5)
NEUT%: 39.7 % (ref 39.6–80.0)
Platelets: 98 10*3/uL — ABNORMAL LOW (ref 145–400)
RBC: 3.87 10*6/uL (ref 3.70–5.32)
RDW: 13.5 % (ref 11.1–15.7)
WBC: 3.1 10*3/uL — ABNORMAL LOW (ref 3.9–10.0)

## 2011-06-04 LAB — BASIC METABOLIC PANEL - CANCER CENTER ONLY
BUN, Bld: 16 mg/dL (ref 7–22)
CO2: 28 mEq/L (ref 18–33)
Calcium: 8.8 mg/dL (ref 8.0–10.3)
Chloride: 103 mEq/L (ref 98–108)
Creat: 0.6 mg/dl (ref 0.6–1.2)
Glucose, Bld: 81 mg/dL (ref 73–118)
Potassium: 3.6 mEq/L (ref 3.3–4.7)
Sodium: 138 mEq/L (ref 128–145)

## 2011-06-05 ENCOUNTER — Encounter (HOSPITAL_BASED_OUTPATIENT_CLINIC_OR_DEPARTMENT_OTHER): Payer: Private Health Insurance - Indemnity | Admitting: Hematology & Oncology

## 2011-06-05 DIAGNOSIS — Z5112 Encounter for antineoplastic immunotherapy: Secondary | ICD-10-CM

## 2011-06-05 DIAGNOSIS — C4360 Malignant melanoma of unspecified upper limb, including shoulder: Secondary | ICD-10-CM

## 2011-06-06 ENCOUNTER — Encounter (HOSPITAL_BASED_OUTPATIENT_CLINIC_OR_DEPARTMENT_OTHER): Payer: Private Health Insurance - Indemnity | Admitting: Hematology & Oncology

## 2011-06-06 DIAGNOSIS — C4360 Malignant melanoma of unspecified upper limb, including shoulder: Secondary | ICD-10-CM

## 2011-06-06 DIAGNOSIS — Z5112 Encounter for antineoplastic immunotherapy: Secondary | ICD-10-CM

## 2011-06-07 ENCOUNTER — Encounter (HOSPITAL_BASED_OUTPATIENT_CLINIC_OR_DEPARTMENT_OTHER): Payer: Private Health Insurance - Indemnity | Admitting: Hematology & Oncology

## 2011-06-07 DIAGNOSIS — Z5112 Encounter for antineoplastic immunotherapy: Secondary | ICD-10-CM

## 2011-06-07 DIAGNOSIS — C4360 Malignant melanoma of unspecified upper limb, including shoulder: Secondary | ICD-10-CM

## 2011-06-08 ENCOUNTER — Encounter (HOSPITAL_BASED_OUTPATIENT_CLINIC_OR_DEPARTMENT_OTHER): Payer: Private Health Insurance - Indemnity | Admitting: Hematology & Oncology

## 2011-06-08 DIAGNOSIS — C4360 Malignant melanoma of unspecified upper limb, including shoulder: Secondary | ICD-10-CM

## 2011-06-08 DIAGNOSIS — Z5112 Encounter for antineoplastic immunotherapy: Secondary | ICD-10-CM

## 2011-06-20 ENCOUNTER — Ambulatory Visit (HOSPITAL_BASED_OUTPATIENT_CLINIC_OR_DEPARTMENT_OTHER)
Admission: RE | Admit: 2011-06-20 | Discharge: 2011-06-20 | Disposition: A | Discharge status: Home / Self Care | Payer: Private Health Insurance - Indemnity | Source: Ambulatory Visit | Attending: Hematology & Oncology | Admitting: Hematology & Oncology

## 2011-06-20 DIAGNOSIS — C4359 Malignant melanoma of other part of trunk: Secondary | ICD-10-CM

## 2011-06-20 DIAGNOSIS — C773 Secondary and unspecified malignant neoplasm of axilla and upper limb lymph nodes: Secondary | ICD-10-CM | POA: Insufficient documentation

## 2011-06-20 DIAGNOSIS — C4360 Malignant melanoma of unspecified upper limb, including shoulder: Secondary | ICD-10-CM | POA: Insufficient documentation

## 2011-06-20 MED ORDER — FLUDEOXYGLUCOSE F - 18 (FDG) INJECTION: 13.2000 | Freq: Once | INTRAVENOUS | Status: AC | PRN

## 2011-07-02 ENCOUNTER — Encounter (HOSPITAL_BASED_OUTPATIENT_CLINIC_OR_DEPARTMENT_OTHER): Payer: Private Health Insurance - Indemnity | Admitting: Hematology & Oncology

## 2011-07-02 DIAGNOSIS — C4360 Malignant melanoma of unspecified upper limb, including shoulder: Secondary | ICD-10-CM

## 2011-07-04 ENCOUNTER — Ambulatory Visit
Admission: RE | Admit: 2011-07-04 | Discharge: 2011-07-04 | Disposition: A | Discharge status: Home / Self Care | Payer: Medicare Other | Source: Ambulatory Visit | Attending: Radiation Oncology | Admitting: Radiation Oncology

## 2011-07-04 DIAGNOSIS — Z86718 Personal history of other venous thrombosis and embolism: Secondary | ICD-10-CM | POA: Insufficient documentation

## 2011-07-04 DIAGNOSIS — G4730 Sleep apnea, unspecified: Secondary | ICD-10-CM | POA: Insufficient documentation

## 2011-07-04 DIAGNOSIS — L538 Other specified erythematous conditions: Secondary | ICD-10-CM | POA: Insufficient documentation

## 2011-07-04 DIAGNOSIS — I10 Essential (primary) hypertension: Secondary | ICD-10-CM | POA: Insufficient documentation

## 2011-07-04 DIAGNOSIS — Z510 Encounter for antineoplastic radiation therapy: Secondary | ICD-10-CM | POA: Insufficient documentation

## 2011-07-04 DIAGNOSIS — Z79899 Other long term (current) drug therapy: Secondary | ICD-10-CM | POA: Insufficient documentation

## 2011-07-04 DIAGNOSIS — C4360 Malignant melanoma of unspecified upper limb, including shoulder: Secondary | ICD-10-CM | POA: Insufficient documentation

## 2011-08-06 ENCOUNTER — Other Ambulatory Visit: Payer: Self-pay | Admitting: Hematology & Oncology

## 2011-08-06 ENCOUNTER — Encounter (HOSPITAL_BASED_OUTPATIENT_CLINIC_OR_DEPARTMENT_OTHER): Payer: Medicare Other | Admitting: Hematology & Oncology

## 2011-08-06 DIAGNOSIS — C4359 Malignant melanoma of other part of trunk: Secondary | ICD-10-CM

## 2011-08-06 DIAGNOSIS — C4360 Malignant melanoma of unspecified upper limb, including shoulder: Secondary | ICD-10-CM

## 2011-08-06 DIAGNOSIS — Z5111 Encounter for antineoplastic chemotherapy: Secondary | ICD-10-CM

## 2011-08-06 DIAGNOSIS — R5381 Other malaise: Secondary | ICD-10-CM

## 2011-08-06 LAB — CBC WITH DIFFERENTIAL (CANCER CENTER ONLY)
BASO#: 0 10*3/uL (ref 0.0–0.2)
BASO%: 0.2 % (ref 0.0–2.0)
EOS%: 2.7 % (ref 0.0–7.0)
Eosinophils Absolute: 0.1 10*3/uL (ref 0.0–0.5)
HCT: 37.3 % (ref 34.8–46.6)
HGB: 12.7 g/dL (ref 11.6–15.9)
LYMPH#: 0.8 10*3/uL — ABNORMAL LOW (ref 0.9–3.3)
LYMPH%: 18.6 % (ref 14.0–48.0)
MCH: 30.8 pg (ref 26.0–34.0)
MCHC: 34 g/dL (ref 32.0–36.0)
MCV: 90 fL (ref 81–101)
MONO#: 0.4 10*3/uL (ref 0.1–0.9)
MONO%: 7.8 % (ref 0.0–13.0)
NEUT#: 3.2 10*3/uL (ref 1.5–6.5)
NEUT%: 70.7 % (ref 39.6–80.0)
Platelets: 159 10*3/uL (ref 145–400)
RBC: 4.13 10*6/uL (ref 3.70–5.32)
RDW: 13.5 % (ref 11.1–15.7)
WBC: 4.5 10*3/uL (ref 3.9–10.0)

## 2011-08-06 LAB — COMPREHENSIVE METABOLIC PANEL
ALT: 14 U/L (ref 0–35)
AST: 12 U/L (ref 0–37)
Albumin: 4.2 g/dL (ref 3.5–5.2)
Alkaline Phosphatase: 45 U/L (ref 39–117)
BUN: 21 mg/dL (ref 6–23)
CO2: 27 mEq/L (ref 19–32)
Calcium: 9.8 mg/dL (ref 8.4–10.5)
Chloride: 105 mEq/L (ref 96–112)
Creatinine, Ser: 0.7 mg/dL (ref 0.50–1.10)
Glucose, Bld: 94 mg/dL (ref 70–99)
Potassium: 4.1 mEq/L (ref 3.5–5.3)
Sodium: 141 mEq/L (ref 135–145)
Total Bilirubin: 0.6 mg/dL (ref 0.3–1.2)
Total Protein: 6.8 g/dL (ref 6.0–8.3)

## 2011-08-06 LAB — TSH: TSH: 0.529 u[IU]/mL (ref 0.350–4.500)

## 2011-08-06 LAB — LACTATE DEHYDROGENASE: LDH: 168 U/L (ref 94–250)

## 2011-09-07 ENCOUNTER — Encounter (HOSPITAL_BASED_OUTPATIENT_CLINIC_OR_DEPARTMENT_OTHER): Payer: Medicare Other | Admitting: Hematology & Oncology

## 2011-09-07 ENCOUNTER — Other Ambulatory Visit: Payer: Self-pay | Admitting: Hematology & Oncology

## 2011-09-07 DIAGNOSIS — Z5111 Encounter for antineoplastic chemotherapy: Secondary | ICD-10-CM

## 2011-09-07 DIAGNOSIS — Z5112 Encounter for antineoplastic immunotherapy: Secondary | ICD-10-CM

## 2011-09-07 DIAGNOSIS — C4360 Malignant melanoma of unspecified upper limb, including shoulder: Secondary | ICD-10-CM

## 2011-09-07 DIAGNOSIS — C774 Secondary and unspecified malignant neoplasm of inguinal and lower limb lymph nodes: Secondary | ICD-10-CM

## 2011-09-07 DIAGNOSIS — C4359 Malignant melanoma of other part of trunk: Secondary | ICD-10-CM

## 2011-09-07 LAB — CBC WITH DIFFERENTIAL (CANCER CENTER ONLY)
BASO#: 0 10*3/uL (ref 0.0–0.2)
BASO%: 0.2 % (ref 0.0–2.0)
EOS%: 2.8 % (ref 0.0–7.0)
Eosinophils Absolute: 0.2 10*3/uL (ref 0.0–0.5)
HCT: 36.9 % (ref 34.8–46.6)
HGB: 12.6 g/dL (ref 11.6–15.9)
LYMPH#: 0.7 10*3/uL — ABNORMAL LOW (ref 0.9–3.3)
LYMPH%: 12.2 % — ABNORMAL LOW (ref 14.0–48.0)
MCH: 30.5 pg (ref 26.0–34.0)
MCHC: 34.1 g/dL (ref 32.0–36.0)
MCV: 89 fL (ref 81–101)
MONO#: 0.6 10*3/uL (ref 0.1–0.9)
MONO%: 9.4 % (ref 0.0–13.0)
NEUT#: 4.6 10*3/uL (ref 1.5–6.5)
NEUT%: 75.4 % (ref 39.6–80.0)
Platelets: 162 10*3/uL (ref 145–400)
RBC: 4.13 10*6/uL (ref 3.70–5.32)
RDW: 13.1 % (ref 11.1–15.7)
WBC: 6.1 10*3/uL (ref 3.9–10.0)

## 2011-09-07 LAB — COMPREHENSIVE METABOLIC PANEL
ALT: 49 U/L — ABNORMAL HIGH (ref 0–35)
AST: 15 U/L (ref 0–37)
Albumin: 3.8 g/dL (ref 3.5–5.2)
Alkaline Phosphatase: 51 U/L (ref 39–117)
BUN: 21 mg/dL (ref 6–23)
CO2: 26 mEq/L (ref 19–32)
Calcium: 9.2 mg/dL (ref 8.4–10.5)
Chloride: 107 mEq/L (ref 96–112)
Creatinine, Ser: 0.68 mg/dL (ref 0.50–1.10)
Glucose, Bld: 96 mg/dL (ref 70–99)
Potassium: 4 mEq/L (ref 3.5–5.3)
Sodium: 143 mEq/L (ref 135–145)
Total Bilirubin: 0.4 mg/dL (ref 0.3–1.2)
Total Protein: 6.3 g/dL (ref 6.0–8.3)

## 2011-09-07 LAB — LACTATE DEHYDROGENASE: LDH: 169 U/L (ref 94–250)

## 2011-09-07 LAB — TSH: TSH: 1.29 u[IU]/mL (ref 0.350–4.500)

## 2011-09-10 NOTE — Progress Notes (Signed)
CC:   Josph Macho, M.D. Juanetta Gosling, MD Jeanmarie Plant, MD  DIAGNOSIS:  Metastatic melanoma.  INDICATION FOR THERAPY:  Local regional control within the right axillary region.  TREATMENT DATES:  July 19, 2011, through August 25, 2011.  SITE/DOSE:  Right axillary area 5040 cGy in 28 fractions (180 cGy per fraction).  IMAGES/FIELD:  The patient was initially treated with a left anterior oblique and a right posterior oblique field arrangement covering the surgical bed and axillary region.  The patient was treated with 10 and 18 MV photons.  The patient proceeded to a cumulative dose of 4500 cGy. After completion of her initial 25 treatments, the patient underwent additional planning and continued with boost field directed at the surgical clip area in the axillary region for a cumulative dose of 5040 cGy.  The patient's boost field was also with a left anterior oblique and a right posterior oblique field arrangement using a combination of 10 and 18 MV photons.  NARRATIVE:  Mrs. Luck tolerated her treatments quite well.  She had minimal pruritus within the axillary region and fatigue.  After the patient's last treatment, the patient's skin showed some erythema and hyperpigmentation changes but no moist desquamation.  FOLLOWUP APPOINTMENT:  1 month.    ______________________________ Billie Lade, Ph.D., M.D. JDK/MEDQ  D:  09/09/2011  T:  09/10/2011  Job:  [id number]

## 2011-09-12 ENCOUNTER — Telehealth: Payer: Self-pay | Admitting: *Deleted

## 2011-09-12 NOTE — Telephone Encounter (Signed)
Called patient to let her know that her thyroid levels were ok per dr. Myna Hidalgo

## 2011-09-18 ENCOUNTER — Ambulatory Visit: Payer: Medicare Other | Attending: Hematology & Oncology | Admitting: Physical Therapy

## 2011-09-18 DIAGNOSIS — R293 Abnormal posture: Secondary | ICD-10-CM | POA: Insufficient documentation

## 2011-09-18 DIAGNOSIS — R5381 Other malaise: Secondary | ICD-10-CM | POA: Insufficient documentation

## 2011-09-18 DIAGNOSIS — M25619 Stiffness of unspecified shoulder, not elsewhere classified: Secondary | ICD-10-CM | POA: Insufficient documentation

## 2011-09-18 DIAGNOSIS — IMO0001 Reserved for inherently not codable concepts without codable children: Secondary | ICD-10-CM | POA: Insufficient documentation

## 2011-09-18 DIAGNOSIS — M6281 Muscle weakness (generalized): Secondary | ICD-10-CM | POA: Insufficient documentation

## 2011-09-20 ENCOUNTER — Encounter: Payer: Self-pay | Admitting: *Deleted

## 2011-09-20 ENCOUNTER — Ambulatory Visit: Payer: Medicare Other | Admitting: Physical Therapy

## 2011-09-20 DIAGNOSIS — C439 Malignant melanoma of skin, unspecified: Secondary | ICD-10-CM | POA: Insufficient documentation

## 2011-09-20 DIAGNOSIS — Z85820 Personal history of malignant melanoma of skin: Secondary | ICD-10-CM | POA: Insufficient documentation

## 2011-09-20 NOTE — Progress Notes (Signed)
Radiation therapy for metastatic melanoma r axillary 07/19/11-08/25/11 Dx  Right suprascapsular  Back Bx= 06/29/10=malignant melanoma Chemotherapy WU:[id number]   Hx D&C  for polyp, Left knee Surgery,  Divorced,retired,   Allergies:sulfa=face swelling,   Family NF:[id number] Ca,Alzheimers

## 2011-09-24 ENCOUNTER — Ambulatory Visit: Payer: Medicare Other | Admitting: Radiation Oncology

## 2011-09-24 ENCOUNTER — Ambulatory Visit: Payer: Medicare Other | Admitting: Physical Therapy

## 2011-09-24 ENCOUNTER — Encounter: Payer: Self-pay | Admitting: *Deleted

## 2011-09-25 ENCOUNTER — Ambulatory Visit
Admission: RE | Admit: 2011-09-25 | Discharge: 2011-09-25 | Disposition: A | Discharge status: Home / Self Care | Payer: Medicare Other | Source: Ambulatory Visit | Attending: Radiation Oncology | Admitting: Radiation Oncology

## 2011-09-25 ENCOUNTER — Encounter: Payer: Self-pay | Admitting: Radiation Oncology

## 2011-09-25 DIAGNOSIS — Z923 Personal history of irradiation: Secondary | ICD-10-CM

## 2011-09-25 DIAGNOSIS — C439 Malignant melanoma of skin, unspecified: Secondary | ICD-10-CM

## 2011-09-25 DIAGNOSIS — C801 Malignant (primary) neoplasm, unspecified: Secondary | ICD-10-CM

## 2011-09-25 NOTE — Progress Notes (Signed)
Pt here f/u right axillary metastaic melanoma Radiation tx 07/19/11-08/25/11  Allergy=sulfa=swelling face/redness/wheezing  No c/o pain, still fatigued some

## 2011-09-25 NOTE — Progress Notes (Signed)
CC:   Josph Macho, M.D. Juanetta Gosling, MD Jeanmarie Plant, MD   DIAGNOSIS:  Metastatic melanoma.  INTERVAL SINCE RADIATION THERAPY:  1 month.  NARRATIVE:  Mrs. Cooperman comes in today for routine followup.  She completed radiation therapy directed to the right axilla totally 5,040 cGy.  Clinically, the patient seems to be doing well, except for persistent fatigue.  She is scheduled to see Dr. Dwain Sarna in the next several days for removal of her Port-A-Cath.  The patient denies any itching or discomfort in the right axillary area.  She denies any problems with swelling in her right arm or hand.  PHYSICAL EXAMINATION:  Vital Signs:  The patient's temperature is 97.5, pulse is 68, blood pressure is 117/83.  The patient's weight is 182 pounds.  Examination of the Lungs:  Reveals them to be clear.  Heart: Regular rhythm and rate.  Examination of the Neck and Supraclavicular Region:  Reveals no evidence of adenopathy.  The axillary areas are free of adenopathy.  Examination of the Right Axillary Area:  Reveals the patient's skin to be well-healed.  There is some very slight hyperpigmentation changes noted.  Examination of the Right Arm and Right Hand Area:  Reveals no appreciable edema.  Motor strength is 5/5 in the proximal and distal muscle groups of the upper extremities.  IMPRESSION AND PLAN:  Clinically stable 1 month out from postoperative radiation treatments.  The patient will return for routine followup in 6 months.  In the interim, she will see Dr. Myna Hidalgo.    ______________________________ Billie Lade, Ph.D., M.D. JDK/MEDQ  D:  09/25/2011  T:  09/25/2011  Job:  [id number]

## 2011-10-01 ENCOUNTER — Ambulatory Visit: Payer: Medicare Other | Admitting: Physical Therapy

## 2011-10-02 ENCOUNTER — Ambulatory Visit (INDEPENDENT_AMBULATORY_CARE_PROVIDER_SITE_OTHER): Payer: Medicare Other | Admitting: General Surgery

## 2011-10-02 ENCOUNTER — Encounter (INDEPENDENT_AMBULATORY_CARE_PROVIDER_SITE_OTHER): Payer: Self-pay | Admitting: General Surgery

## 2011-10-02 VITALS — BP 118/80 | HR 64 | Temp 97.6°F | Resp 16 | Ht 63.0 in | Wt 182.4 lb

## 2011-10-02 DIAGNOSIS — C439 Malignant melanoma of skin, unspecified: Secondary | ICD-10-CM

## 2011-10-02 NOTE — Progress Notes (Signed)
Patient ID: [name], female   DOB: 1946-07-23, 65 y.o.   MRN: [id number]  Chief Complaint  Patient presents with  . Routine Post Op    discuss PAC removal     HPI [name] is a 65 y.o. female.   HPI 56 yof known to me from [facility] of melanoma, alnd and port placement.  She is now done with IFN and would like her port removed.  She has no other new medical issues since I have last seen her.   Past Medical History  Diagnosis Date  . Hypertension   . Allergy   . Venous thrombosis 2011    developed after venous ablation  . Sleep apnea requiring c-pap  . Tinnitus   . Asthma   . Vascular insufficiency     b/l lower extremities  . Cancer 06/29/10    MALIGNANT MELANOMA R SHOULDER  . Melanoma 06/29/10    RIGHT SUPRASCAPULAR BACK  . History of chemotherapy     INERFERON  . History of radiation therapy 07/19/11-08/25/2011    RIGHT AXILLARY REGION/METASTATIC  . History of breast biopsy     BENIGN RIGHT BREAST BIOPSY  . History of kidney stones   . Clotting disorder     Past Surgical History  Procedure Date  . Venous ablation 2011    left leg  . Lumbar laminectomy     l4-l5  . Back surgery   . Deep axillary sentinel node biopsy / excision     RIGHT  . Portacath placement     left subclavian  . Breast surgery     Family History  Problem Relation Age of Onset  . Uterine cancer Mother     Social History History  Substance Use Topics  . Smoking status: Never Smoker   . Smokeless tobacco: Never Used  . Alcohol Use: Yes     drinks wine occasionally    Allergies  Allergen Reactions  . Sulfa Drugs Cross Reactors Swelling    Sulfa medications-swelling of face    Current Outpatient Prescriptions  Medication Sig Dispense Refill  . Ergocalciferol (DRISDOL PO) Take 1.25 mg by mouth once a week.        . gabapentin (NEURONTIN) 300 MG capsule Take 300 mg by mouth daily.        Marland Kitchen loratadine (CLARITIN) 10 MG tablet Take 10 mg by mouth daily as needed.        .  montelukast (SINGULAIR) 10 MG tablet Take 10 mg by mouth daily.        Marland Kitchen olmesartan-hydrochlorothiazide (BENICAR HCT) 20-12.5 MG per tablet Take 1 tablet by mouth daily.        . potassium chloride SA (K-DUR,KLOR-CON) 20 MEQ tablet Take 20 mEq by mouth daily.          Review of Systems Review of Systems  Constitutional: Negative for fever, chills and unexpected weight change.  HENT: Positive for congestion. Negative for hearing loss, sore throat, trouble swallowing and voice change.   Eyes: Negative for visual disturbance.  Respiratory: Negative for cough and wheezing.   Cardiovascular: Negative for chest pain, palpitations and leg swelling.  Gastrointestinal: Negative for nausea, vomiting, abdominal pain, diarrhea, constipation, blood in stool, abdominal distention and anal bleeding.  Genitourinary: Negative for hematuria, vaginal bleeding and difficulty urinating.  Musculoskeletal: Negative for arthralgias.  Skin: Negative for rash and wound.  Neurological: Positive for headaches. Negative for seizures and syncope.  Hematological: Negative for adenopathy. Does not bruise/bleed easily.  Psychiatric/Behavioral: Negative for confusion.    Blood pressure 118/80, pulse 64, temperature 97.6 F (36.4 C), temperature source Temporal, resp. rate 16, height 5\' 3"  (1.6 m), weight 182 lb 6 oz (82.725 kg).  Physical Exam Physical Exam  Constitutional: She appears well-developed and well-nourished.  Neck: Neck supple.  Cardiovascular: Normal rate, regular rhythm and normal heart sounds.   Pulmonary/Chest: Effort normal and breath sounds normal. She has no wheezes. She has no rales.    Lymphadenopathy:    She has no cervical adenopathy.     Assessment    Melanoma s/p IFN treatment    Plan    She is done with port and would like out now.  We discussed port placement which she would like to do under local MAC.  We discussed risks of bleeding, infection, breakage of line.          Jackson Coffield 10/02/2011, 3:47 PM

## 2011-10-03 ENCOUNTER — Ambulatory Visit: Payer: Medicare Other | Admitting: Physical Therapy

## 2011-10-09 ENCOUNTER — Ambulatory Visit: Payer: Medicare Other | Attending: Hematology & Oncology | Admitting: Physical Therapy

## 2011-10-09 DIAGNOSIS — R5381 Other malaise: Secondary | ICD-10-CM | POA: Insufficient documentation

## 2011-10-09 DIAGNOSIS — IMO0001 Reserved for inherently not codable concepts without codable children: Secondary | ICD-10-CM | POA: Insufficient documentation

## 2011-10-09 DIAGNOSIS — M6281 Muscle weakness (generalized): Secondary | ICD-10-CM | POA: Insufficient documentation

## 2011-10-09 DIAGNOSIS — R293 Abnormal posture: Secondary | ICD-10-CM | POA: Insufficient documentation

## 2011-10-09 DIAGNOSIS — M25619 Stiffness of unspecified shoulder, not elsewhere classified: Secondary | ICD-10-CM | POA: Insufficient documentation

## 2011-10-11 ENCOUNTER — Ambulatory Visit: Payer: Medicare Other | Admitting: Physical Therapy

## 2011-10-16 ENCOUNTER — Ambulatory Visit: Payer: Medicare Other | Admitting: Physical Therapy

## 2011-10-17 ENCOUNTER — Encounter (HOSPITAL_BASED_OUTPATIENT_CLINIC_OR_DEPARTMENT_OTHER): Payer: Self-pay | Admitting: *Deleted

## 2011-10-17 NOTE — Progress Notes (Signed)
ekg over yr old-since this is PAC out-see if aneth for that day will wave Needs istat To bring cpap-does use and is compliant

## 2011-10-18 ENCOUNTER — Telehealth: Payer: Self-pay | Admitting: *Deleted

## 2011-10-18 ENCOUNTER — Ambulatory Visit: Payer: Medicare Other | Admitting: Physical Therapy

## 2011-10-18 NOTE — Telephone Encounter (Signed)
Per Pt talked to Liborio Nixon at Dr. Kerry Dory office canceled 12-14 port removal she has a abnormal mammogram

## 2011-10-19 ENCOUNTER — Ambulatory Visit (HOSPITAL_BASED_OUTPATIENT_CLINIC_OR_DEPARTMENT_OTHER): Admission: RE | Admit: 2011-10-19 | Payer: Medicare Other | Source: Ambulatory Visit | Admitting: General Surgery

## 2011-10-19 ENCOUNTER — Encounter (HOSPITAL_BASED_OUTPATIENT_CLINIC_OR_DEPARTMENT_OTHER): Admission: RE | Payer: Self-pay | Source: Ambulatory Visit

## 2011-10-19 HISTORY — DX: Adverse effect of unspecified anesthetic, initial encounter: T41.45XA

## 2011-10-19 HISTORY — DX: Myoneural disorder, unspecified: G70.9

## 2011-10-19 HISTORY — DX: Other complications of anesthesia, initial encounter: T88.59XA

## 2011-10-19 SURGERY — REMOVAL PORT-A-CATH
Anesthesia: Monitor Anesthesia Care

## 2011-10-26 ENCOUNTER — Ambulatory Visit: Payer: Medicare Other | Admitting: Physical Therapy

## 2011-10-31 ENCOUNTER — Encounter: Payer: Medicare Other | Admitting: Physical Therapy

## 2011-11-01 ENCOUNTER — Ambulatory Visit: Payer: Medicare Other | Admitting: Physical Therapy

## 2011-11-02 ENCOUNTER — Ambulatory Visit: Payer: Medicare Other | Admitting: Physical Therapy

## 2011-11-05 ENCOUNTER — Ambulatory Visit: Payer: Medicare Other | Admitting: Physical Therapy

## 2011-11-06 DIAGNOSIS — H903 Sensorineural hearing loss, bilateral: Secondary | ICD-10-CM

## 2011-11-06 HISTORY — PX: BREAST BIOPSY: SHX20

## 2011-11-06 HISTORY — DX: Sensorineural hearing loss, bilateral: H90.3

## 2011-11-07 ENCOUNTER — Ambulatory Visit: Payer: Medicare Other | Attending: Hematology & Oncology | Admitting: Physical Therapy

## 2011-11-07 DIAGNOSIS — R293 Abnormal posture: Secondary | ICD-10-CM | POA: Insufficient documentation

## 2011-11-07 DIAGNOSIS — M6281 Muscle weakness (generalized): Secondary | ICD-10-CM | POA: Insufficient documentation

## 2011-11-07 DIAGNOSIS — M25619 Stiffness of unspecified shoulder, not elsewhere classified: Secondary | ICD-10-CM | POA: Insufficient documentation

## 2011-11-07 DIAGNOSIS — IMO0001 Reserved for inherently not codable concepts without codable children: Secondary | ICD-10-CM | POA: Diagnosis not present

## 2011-11-07 DIAGNOSIS — R5381 Other malaise: Secondary | ICD-10-CM | POA: Diagnosis not present

## 2011-11-13 ENCOUNTER — Ambulatory Visit: Payer: Medicare Other | Admitting: Physical Therapy

## 2011-11-15 ENCOUNTER — Ambulatory Visit: Payer: Medicare Other | Admitting: Physical Therapy

## 2011-11-19 ENCOUNTER — Ambulatory Visit: Payer: Medicare Other | Admitting: Physical Therapy

## 2011-11-21 ENCOUNTER — Ambulatory Visit: Payer: Medicare Other | Admitting: Physical Therapy

## 2011-11-28 ENCOUNTER — Ambulatory Visit: Payer: Medicare Other | Admitting: Physical Therapy

## 2011-11-29 ENCOUNTER — Encounter (HOSPITAL_BASED_OUTPATIENT_CLINIC_OR_DEPARTMENT_OTHER): Payer: Self-pay | Admitting: *Deleted

## 2011-11-29 NOTE — Progress Notes (Signed)
Pt was to have this done 11/19/11-r/s -needed another bx-all clear-no chg in assessment Arrive dos 830am-will need istat and maybe new ekg-old one slightly over 1 yr For pac removal-gen-but she has sleep apnea-so maybe mac

## 2011-11-30 ENCOUNTER — Ambulatory Visit: Payer: Medicare Other | Admitting: Physical Therapy

## 2011-12-03 ENCOUNTER — Ambulatory Visit: Payer: Medicare Other | Admitting: Physical Therapy

## 2011-12-05 ENCOUNTER — Encounter (HOSPITAL_BASED_OUTPATIENT_CLINIC_OR_DEPARTMENT_OTHER): Payer: Self-pay

## 2011-12-05 ENCOUNTER — Ambulatory Visit (HOSPITAL_BASED_OUTPATIENT_CLINIC_OR_DEPARTMENT_OTHER): Payer: Medicare Other | Admitting: Anesthesiology

## 2011-12-05 ENCOUNTER — Encounter (HOSPITAL_BASED_OUTPATIENT_CLINIC_OR_DEPARTMENT_OTHER)
Admission: RE | Disposition: A | Discharge status: Home / Self Care | Payer: Self-pay | Source: Ambulatory Visit | Attending: General Surgery

## 2011-12-05 ENCOUNTER — Encounter (HOSPITAL_BASED_OUTPATIENT_CLINIC_OR_DEPARTMENT_OTHER): Payer: Self-pay | Admitting: Anesthesiology

## 2011-12-05 ENCOUNTER — Ambulatory Visit (HOSPITAL_BASED_OUTPATIENT_CLINIC_OR_DEPARTMENT_OTHER)
Admission: RE | Admit: 2011-12-05 | Discharge: 2011-12-05 | Disposition: A | Discharge status: Home / Self Care | Payer: Medicare Other | Source: Ambulatory Visit | Attending: General Surgery | Admitting: General Surgery

## 2011-12-05 DIAGNOSIS — Z452 Encounter for adjustment and management of vascular access device: Secondary | ICD-10-CM | POA: Diagnosis not present

## 2011-12-05 DIAGNOSIS — C439 Malignant melanoma of skin, unspecified: Secondary | ICD-10-CM | POA: Diagnosis not present

## 2011-12-05 DIAGNOSIS — Z85820 Personal history of malignant melanoma of skin: Secondary | ICD-10-CM | POA: Diagnosis not present

## 2011-12-05 DIAGNOSIS — Z79899 Other long term (current) drug therapy: Secondary | ICD-10-CM | POA: Diagnosis not present

## 2011-12-05 DIAGNOSIS — I10 Essential (primary) hypertension: Secondary | ICD-10-CM | POA: Insufficient documentation

## 2011-12-05 HISTORY — PX: PORT-A-CATH REMOVAL: SHX5289

## 2011-12-05 LAB — POCT I-STAT, CHEM 8
BUN: 22 mg/dL (ref 6–23)
Calcium, Ion: 1.24 mmol/L (ref 1.12–1.32)
Chloride: 105 mEq/L (ref 96–112)
Creatinine, Ser: 0.8 mg/dL (ref 0.50–1.10)
Glucose, Bld: 97 mg/dL (ref 70–99)
HCT: 35 % — ABNORMAL LOW (ref 36.0–46.0)
Hemoglobin: 11.9 g/dL — ABNORMAL LOW (ref 12.0–15.0)
Potassium: 3.7 mEq/L (ref 3.5–5.1)
Sodium: 142 mEq/L (ref 135–145)
TCO2: 27 mmol/L (ref 0–100)

## 2011-12-05 SURGERY — REMOVAL PORT-A-CATH
Anesthesia: Monitor Anesthesia Care | Site: Chest | Wound class: Clean

## 2011-12-05 MED ORDER — LIDOCAINE HCL (CARDIAC) 20 MG/ML IV SOLN
INTRAVENOUS | Status: DC | PRN
  Administered 2011-12-05: 60 mg via INTRAVENOUS

## 2011-12-05 MED ORDER — FENTANYL CITRATE 0.05 MG/ML IJ SOLN
INTRAMUSCULAR | Status: DC | PRN
  Administered 2011-12-05 (×3): 50 ug via INTRAVENOUS

## 2011-12-05 MED ORDER — FENTANYL CITRATE 0.05 MG/ML IJ SOLN
25.0000 ug | INTRAMUSCULAR | Status: DC | PRN
  Administered 2011-12-05: 25 ug via INTRAVENOUS

## 2011-12-05 MED ORDER — LACTATED RINGERS IV SOLN
INTRAVENOUS | Status: DC
  Administered 2011-12-05: 09:00:00 via INTRAVENOUS

## 2011-12-05 MED ORDER — MEPERIDINE HCL 25 MG/ML IJ SOLN: 6.2500 mg | INTRAMUSCULAR | Status: DC | PRN

## 2011-12-05 MED ORDER — BUPIVACAINE HCL (PF) 0.25 % IJ SOLN
INTRAMUSCULAR | Status: DC | PRN
  Administered 2011-12-05: 5 mL

## 2011-12-05 MED ORDER — LIDOCAINE-EPINEPHRINE (PF) 1 %-1:200000 IJ SOLN
INTRAMUSCULAR | Status: DC | PRN
  Administered 2011-12-05: 5 mL

## 2011-12-05 MED ORDER — PROPOFOL 10 MG/ML IV EMUL
INTRAVENOUS | Status: DC | PRN
  Administered 2011-12-05: 120 ug/kg/min via INTRAVENOUS

## 2011-12-05 MED ORDER — PROMETHAZINE HCL 25 MG/ML IJ SOLN: 6.2500 mg | INTRAMUSCULAR | Status: DC | PRN

## 2011-12-05 MED ORDER — IBUPROFEN 200 MG PO TABS: 200.0000 mg | ORAL_TABLET | Freq: Four times a day (QID) | ORAL | Status: DC | PRN

## 2011-12-05 MED ORDER — HYDROCODONE-ACETAMINOPHEN 5-325 MG PO TABS: 1.0000 | ORAL_TABLET | ORAL | Status: AC | PRN

## 2011-12-05 SURGICAL SUPPLY — 27 items
BLADE SURG 15 STRL LF DISP TIS (BLADE) ×1 IMPLANT
BLADE SURG 15 STRL SS (BLADE) ×1
CHLORAPREP W/TINT 26ML (MISCELLANEOUS) ×2 IMPLANT
CLOTH BEACON ORANGE TIMEOUT ST (SAFETY) ×2 IMPLANT
COVER MAYO STAND STRL (DRAPES) ×2 IMPLANT
COVER TABLE BACK 60X90 (DRAPES) ×2 IMPLANT
DECANTER SPIKE VIAL GLASS SM (MISCELLANEOUS) IMPLANT
DERMABOND ADVANCED (GAUZE/BANDAGES/DRESSINGS) ×1
DERMABOND ADVANCED .7 DNX12 (GAUZE/BANDAGES/DRESSINGS) ×1 IMPLANT
DRAPE PED LAPAROTOMY (DRAPES) ×2 IMPLANT
ELECT COATED BLADE 2.86 ST (ELECTRODE) ×2 IMPLANT
ELECT REM PT RETURN 9FT ADLT (ELECTROSURGICAL) ×2
ELECTRODE REM PT RTRN 9FT ADLT (ELECTROSURGICAL) ×1 IMPLANT
GLOVE BIO SURGEON STRL SZ7 (GLOVE) ×2 IMPLANT
GLOVE ECLIPSE 6.5 STRL STRAW (GLOVE) ×2 IMPLANT
GOWN PREVENTION PLUS XLARGE (GOWN DISPOSABLE) ×4 IMPLANT
NEEDLE HYPO 25X1 1.5 SAFETY (NEEDLE) ×2 IMPLANT
PACK BASIN DAY SURGERY FS (CUSTOM PROCEDURE TRAY) ×2 IMPLANT
PENCIL BUTTON HOLSTER BLD 10FT (ELECTRODE) ×2 IMPLANT
SLEEVE SCD COMPRESS KNEE MED (MISCELLANEOUS) ×2 IMPLANT
SUT MON AB 4-0 PC3 18 (SUTURE) ×2 IMPLANT
SUT VIC AB 3-0 SH 27 (SUTURE) ×1
SUT VIC AB 3-0 SH 27X BRD (SUTURE) ×1 IMPLANT
SYR CONTROL 10ML LL (SYRINGE) ×2 IMPLANT
TOWEL OR 17X24 6PK STRL BLUE (TOWEL DISPOSABLE) ×2 IMPLANT
TOWEL OR NON WOVEN STRL DISP B (DISPOSABLE) ×2 IMPLANT
WATER STERILE IRR 1000ML POUR (IV SOLUTION) IMPLANT

## 2011-12-05 NOTE — Interval H&P Note (Signed)
History and Physical Interval Note:  12/05/2011 10:12 AM  [name]  has presented today for surgery, with the diagnosis of melanoma treatment completed  The various methods of treatment have been discussed with the patient and family. After consideration of risks, benefits and other options for treatment, the patient has consented to  Procedure(s): REMOVAL PORT-A-CATH as a surgical intervention .  The patients' history has been reviewed, patient examined, no change in status, stable for surgery.  I have reviewed the patients' chart and labs.  Questions were answered to the patient's satisfaction.     Remigio Mcmillon

## 2011-12-05 NOTE — Anesthesia Preprocedure Evaluation (Signed)
Anesthesia Evaluation  Patient identified by MRN, date of birth, ID band Patient awake    Reviewed: Allergy & Precautions  History of Anesthesia Complications (+) PROLONGED EMERGENCENegative for: history of anesthetic complications  Airway Mallampati: III  Neck ROM: Limited  Mouth opening: Limited Mouth Opening  Dental  (+) Teeth Intact   Pulmonary asthma , sleep apnea ,  clear to auscultation        Cardiovascular hypertension, Regular Normal    Neuro/Psych Muscle spasms after chemorx  Neuromuscular disease    GI/Hepatic   Endo/Other    Renal/GU      Musculoskeletal   Abdominal (+) obese,   Peds  Hematology   Anesthesia Other Findings   Reproductive/Obstetrics                           Anesthesia Physical Anesthesia Plan  ASA: II  Anesthesia Plan: MAC   Post-op Pain Management:    Induction: Intravenous  Airway Management Planned: Natural Airway and Simple Face Mask  Additional Equipment:   Intra-op Plan:   Post-operative Plan:   Informed Consent:   Plan Discussed with: CRNA and Surgeon  Anesthesia Plan Comments:         Anesthesia Quick Evaluation

## 2011-12-05 NOTE — Anesthesia Procedure Notes (Signed)
Procedure Name: MAC Date/Time: 12/05/2011 10:21 AM Performed by: Gladys Damme Pre-anesthesia Checklist: Patient identified, Emergency Drugs available, Suction available and Patient being monitored Patient Re-evaluated:Patient Re-evaluated prior to inductionOxygen Delivery Method: Simple face mask Preoxygenation: Pre-oxygenation with 100% oxygen Placement Confirmation: positive ETCO2

## 2011-12-05 NOTE — H&P (Signed)
[name] is a 66 y.o. female.  HPI  27 yof known to me from [facility] of melanoma, alnd and port placement. She is now done with IFN and would like her port removed. She had a core biopsy for some microcalcifications seen on mmg last month which are benign.  She is otherwise doing well. She has some pulling in her left chest near her port.  Past Medical History   Diagnosis  Date   .  Hypertension    .  Allergy    .  Venous thrombosis  2011     developed after venous ablation   .  Sleep apnea  requiring c-pap   .  Tinnitus    .  Asthma    .  Vascular insufficiency      b/l lower extremities   .  Cancer  06/29/10     MALIGNANT MELANOMA R SHOULDER   .  Melanoma  06/29/10     RIGHT SUPRASCAPULAR BACK   .  History of chemotherapy      INERFERON   .  History of radiation therapy  07/19/11-08/25/2011     RIGHT AXILLARY REGION/METASTATIC   .  History of breast biopsy      BENIGN RIGHT BREAST BIOPSY   .  History of kidney stones    .  Clotting disorder     Past Surgical History   Procedure  Date   .  Venous ablation  2011     left leg   .  Lumbar laminectomy      l4-l5   .  Back surgery    .  Deep axillary sentinel node biopsy / excision      RIGHT   .  Portacath placement      left subclavian   .  Breast surgery     Family History   Problem  Relation  Age of Onset   .  Uterine cancer  Mother     Social History  History   Substance Use Topics   .  Smoking status:  Never Smoker   .  Smokeless tobacco:  Never Used   .  Alcohol Use:  Yes      drinks wine occasionally    Allergies   Allergen  Reactions   .  Sulfa Drugs Cross Reactors  Swelling     Sulfa medications-swelling of face    Current Outpatient Prescriptions   Medication  Sig  Dispense  Refill   .  Ergocalciferol (DRISDOL PO)  Take 1.25 mg by mouth once a week.     .  gabapentin (NEURONTIN) 300 MG capsule  Take 300 mg by mouth daily.     Marland Kitchen  loratadine (CLARITIN) 10 MG tablet  Take 10 mg by mouth daily as  needed.     .  montelukast (SINGULAIR) 10 MG tablet  Take 10 mg by mouth daily.     Marland Kitchen  olmesartan-hydrochlorothiazide (BENICAR HCT) 20-12.5 MG per tablet  Take 1 tablet by mouth daily.     .  potassium chloride SA (K-DUR,KLOR-CON) 20 MEQ tablet  Take 20 mEq by mouth daily.        Physical Exam  Physical Exam  Constitutional: She appears well-developed and well-nourished.  Cardiovascular: Normal rate, regular rhythm and normal heart sounds.  Pulmonary/Chest: Effort normal and breath sounds normal. She has no wheezes. She has no rales. Left sided port in place without infection Lymphadenopathy:  She has no cervical adenopathy.   Assessment  Melanoma s/p IFN treatment   Plan   She has completed her inteferon and would like her port removed. We discussed port removal which she would like to do under local MAC. We discussed risks of bleeding, infection, breakage of line.

## 2011-12-05 NOTE — Anesthesia Postprocedure Evaluation (Signed)
  Anesthesia Post-op Note  Patient: [name]  Procedure(s) Performed:  REMOVAL PORT-A-CATH - left port removal  Patient Location: PACU  Anesthesia Type: MAC  Level of Consciousness: awake, alert  and oriented  Airway and Oxygen Therapy: Patient Spontanous Breathing  Post-op Pain: none  Post-op Assessment: Post-op Vital signs reviewed, Patient's Cardiovascular Status Stable, Respiratory Function Stable, Patent Airway, No signs of Nausea or vomiting and Pain level controlled  Post-op Vital Signs: Reviewed and stable  Complications: No apparent anesthesia complications

## 2011-12-05 NOTE — Op Note (Signed)
Preoperative diagnosis: Melanoma status post interferon treatment, no longer needs venous access Postoperative diagnosis: Same as above Procedure: Left subclavian port removal Surgeon: Dr. Harden Mo Anesthesia: Local with monitored anesthesia care Specimens: None Drains: None Estimated blood loss: Minimal Complications: None Disposition patient to recovery room in stable condition Sponge needle count correct at end of operation  Indications: This is a 66 year old female who I took care of for a melanoma. She has undergone interferon treatment now. She no longer needs venous access one like to have her port removed. We discussed port removal with the risks and benefits associated with that.  Procedure: After informed consent was obtained the patient was taken to the operating room. She was placed under monitored anesthesia care. Her left chest was prepped and draped in the standard sterile surgical fashion. A surgical timeout was performed.  I infiltrated a mixture of 1% lidocaine and quarter percent Marcaine throughout the region of her port as well as her prior incision. I then made an elliptical incision and excised for prior scar. I then used cautery and blunt dissection to remove the port as well as the entire line. I removed all the stitch material. This was hemostatic. I closed this with 3-0 Vicryl, 4 Monocryl, and Dermabond. She tolerated this well and was transferred to the recovery room in stable condition.

## 2011-12-05 NOTE — Transfer of Care (Signed)
Immediate Anesthesia Transfer of Care Note  Patient: [name]  Procedure(s) Performed:  REMOVAL PORT-A-CATH - left port removal  Patient Location: PACU  Anesthesia Type: MAC  Level of Consciousness: awake and alert   Airway & Oxygen Therapy: Patient Spontanous Breathing and Patient connected to face mask oxygen  Post-op Assessment: Report given to PACU RN and Post -op Vital signs reviewed and stable  Post vital signs: Reviewed and stable  Complications: No apparent anesthesia complications

## 2011-12-06 ENCOUNTER — Encounter (HOSPITAL_BASED_OUTPATIENT_CLINIC_OR_DEPARTMENT_OTHER): Payer: Self-pay | Admitting: General Surgery

## 2011-12-10 ENCOUNTER — Other Ambulatory Visit (HOSPITAL_BASED_OUTPATIENT_CLINIC_OR_DEPARTMENT_OTHER): Payer: Medicare Other | Admitting: Lab

## 2011-12-10 ENCOUNTER — Ambulatory Visit (HOSPITAL_BASED_OUTPATIENT_CLINIC_OR_DEPARTMENT_OTHER): Payer: Medicare Other | Admitting: Hematology & Oncology

## 2011-12-10 VITALS — BP 127/85 | HR 90 | Temp 97.0°F | Ht 63.0 in | Wt 184.0 lb

## 2011-12-10 DIAGNOSIS — R5381 Other malaise: Secondary | ICD-10-CM | POA: Diagnosis not present

## 2011-12-10 DIAGNOSIS — C4360 Malignant melanoma of unspecified upper limb, including shoulder: Secondary | ICD-10-CM

## 2011-12-10 DIAGNOSIS — Z5112 Encounter for antineoplastic immunotherapy: Secondary | ICD-10-CM | POA: Diagnosis not present

## 2011-12-10 DIAGNOSIS — Z5111 Encounter for antineoplastic chemotherapy: Secondary | ICD-10-CM | POA: Diagnosis not present

## 2011-12-10 DIAGNOSIS — C4359 Malignant melanoma of other part of trunk: Secondary | ICD-10-CM | POA: Diagnosis not present

## 2011-12-10 DIAGNOSIS — C439 Malignant melanoma of skin, unspecified: Secondary | ICD-10-CM

## 2011-12-10 LAB — COMPREHENSIVE METABOLIC PANEL
ALT: 15 U/L (ref 0–35)
AST: 15 U/L (ref 0–37)
Albumin: 4.4 g/dL (ref 3.5–5.2)
Alkaline Phosphatase: 60 U/L (ref 39–117)
BUN: 19 mg/dL (ref 6–23)
CO2: 29 mEq/L (ref 19–32)
Calcium: 10.2 mg/dL (ref 8.4–10.5)
Chloride: 104 mEq/L (ref 96–112)
Creatinine, Ser: 0.72 mg/dL (ref 0.50–1.10)
Glucose, Bld: 94 mg/dL (ref 70–99)
Potassium: 3.7 mEq/L (ref 3.5–5.3)
Sodium: 143 mEq/L (ref 135–145)
Total Bilirubin: 0.5 mg/dL (ref 0.3–1.2)
Total Protein: 6.6 g/dL (ref 6.0–8.3)

## 2011-12-10 LAB — TSH: TSH: 0.01 u[IU]/mL — ABNORMAL LOW (ref 0.350–4.500)

## 2011-12-10 LAB — CBC WITH DIFFERENTIAL (CANCER CENTER ONLY)
BASO#: 0 10*3/uL (ref 0.0–0.2)
BASO%: 0.2 % (ref 0.0–2.0)
EOS%: 4 % (ref 0.0–7.0)
Eosinophils Absolute: 0.2 10*3/uL (ref 0.0–0.5)
HCT: 37.4 % (ref 34.8–46.6)
HGB: 12.5 g/dL (ref 11.6–15.9)
LYMPH#: 1.4 10*3/uL (ref 0.9–3.3)
LYMPH%: 29.7 % (ref 14.0–48.0)
MCH: 30.3 pg (ref 26.0–34.0)
MCHC: 33.4 g/dL (ref 32.0–36.0)
MCV: 91 fL (ref 81–101)
MONO#: 0.5 10*3/uL (ref 0.1–0.9)
MONO%: 9.5 % (ref 0.0–13.0)
NEUT#: 2.7 10*3/uL (ref 1.5–6.5)
NEUT%: 56.6 % (ref 39.6–80.0)
Platelets: 172 10*3/uL (ref 145–400)
RBC: 4.12 10*6/uL (ref 3.70–5.32)
RDW: 12.1 % (ref 11.1–15.7)
WBC: 4.7 10*3/uL (ref 3.9–10.0)

## 2011-12-10 LAB — LACTATE DEHYDROGENASE: LDH: 138 U/L (ref 94–250)

## 2011-12-10 NOTE — Progress Notes (Signed)
CC:   [name]. Jerl Santos, M.D. Juanetta Gosling, MD Jeanmarie Plant, MD  DIAGNOSIS:  Stage IIIC (T3b N3 N0) melanoma of the right shoulder.  CURRENT THERAPY:  Observation.  INTERVAL HISTORY:  [name] comes in for her followup.  She has been pretty busy since we last saw her.  She was concerned over a mammogram that she had done.  This showed I think some calcifications in the left breast.  She underwent a biopsy.  This was done on 11/01/2011.  The pathology report ([id number]) showed benign calcifications associated with fibrosclerotic breast tissue.  She was quite relieved that this is not an issue for her.  The problem now is that she has difficulty with her left shoulder.  This was the opposite shoulder where she underwent the axillary node dissection.  We will have to see about getting her over to Dr. Jerl Santos for an evaluation.  Her Port-A-Cath is out and she is happy about this.  She has had some fatigue.  She is eating well.  She has not noted any problems with nausea or vomiting.  There has been no change in bowel or bladder habits.  PHYSICAL EXAMINATION:  General:  This is a well-developed, well- nourished white female in no obvious distress.  Vital signs:  97, pulse 90, respiratory rate 16, blood pressure 127/85.  Weight is 184.  Head and neck:  Exam shows a normocephalic, atraumatic skull.  There are no ocular or oral lesions.  No palpable cervical or supraclavicular lymph nodes.  Lungs:  Are clear bilaterally.  Cardiac:  Regular rate and rhythm with normal S1, S2.  There are no murmurs, rubs or bruits. Axillary:  Exam shows no left axillary adenopathy.  Right axilla shows the lymphadenectomy scar.  There may be some slight fullness in the right axilla but no obvious masses or lymph nodes are noted.  Abdomen: Soft with good bowel sounds.  There is no fluid wave.  There is no guarding or rebound tenderness.  There is no palpable hepatosplenomegaly.  Back:  No  tenderness over the spine, ribs or hips. Her wide local excision scar on the right upper back is well-healed. Extremities:  Shows no lymphedema in the right arm.  She has decreased range of motion of the left shoulder.  She has very little internal or external rotation of the shoulder on the left side.  She has good range of motion of her lower extremities.  Skin:  Exam shows no rash, ecchymosis or petechia.  She has a couple hyperpigmented lesions which do not look suspicious.  Neurological:  Shows no focal neurological deficits.  LABORATORY STUDIES:  Show white cell count 4.7, hemoglobin 12.5, hematocrit 37.4, platelet count 172.  IMPRESSION:  [name] is a 66 year old white female with stage IIIC melanoma of the right posterior shoulder.  She had 8 positive lymph nodes.  Her melanoma is BRAF negative.  She received high dose interferon.  She received 4 months' worth of high dose interferon.  This was based on a novel protocol out of [place] which seemed to show good results.  She then underwent radiation therapy to the right axilla to help with local control.  She is improving.  She is still having some issues with her neck.  She states she is taking physical therapy for this.  Again, we will get her to Dr. Jerl Santos to see how things look to see what he can do with her left shoulder to improve her quality of life.  I do not see any evidence of recurrence.  I do not see that we need to put her through any kind of scans right now.  We did check her TSH.  The TSH really was quite low actually.  I am not sure what this is indicative of.  I do not see any evidence of hypothyroidism that is associated with the thyroid itself.  I do want to see her back in about 3 months' time so that we can maintain followup.    ______________________________ Josph Macho, M.D. PRE/MEDQ  D:  12/10/2011  T:  12/10/2011  Job:  [id number]

## 2011-12-10 NOTE — Progress Notes (Signed)
This office note has been dictated.

## 2011-12-12 ENCOUNTER — Ambulatory Visit: Payer: Medicare Other | Attending: Hematology & Oncology | Admitting: Physical Therapy

## 2011-12-12 DIAGNOSIS — R293 Abnormal posture: Secondary | ICD-10-CM | POA: Insufficient documentation

## 2011-12-12 DIAGNOSIS — IMO0001 Reserved for inherently not codable concepts without codable children: Secondary | ICD-10-CM | POA: Diagnosis not present

## 2011-12-12 DIAGNOSIS — M25619 Stiffness of unspecified shoulder, not elsewhere classified: Secondary | ICD-10-CM | POA: Insufficient documentation

## 2011-12-12 DIAGNOSIS — R5381 Other malaise: Secondary | ICD-10-CM | POA: Diagnosis not present

## 2011-12-12 DIAGNOSIS — M6281 Muscle weakness (generalized): Secondary | ICD-10-CM | POA: Insufficient documentation

## 2011-12-14 ENCOUNTER — Ambulatory Visit: Payer: Medicare Other | Admitting: Physical Therapy

## 2011-12-14 DIAGNOSIS — R5381 Other malaise: Secondary | ICD-10-CM | POA: Diagnosis not present

## 2011-12-14 DIAGNOSIS — IMO0001 Reserved for inherently not codable concepts without codable children: Secondary | ICD-10-CM | POA: Diagnosis not present

## 2011-12-14 DIAGNOSIS — M25619 Stiffness of unspecified shoulder, not elsewhere classified: Secondary | ICD-10-CM | POA: Diagnosis not present

## 2011-12-14 DIAGNOSIS — M6281 Muscle weakness (generalized): Secondary | ICD-10-CM | POA: Diagnosis not present

## 2011-12-14 DIAGNOSIS — R293 Abnormal posture: Secondary | ICD-10-CM | POA: Diagnosis not present

## 2011-12-17 ENCOUNTER — Telehealth: Payer: Self-pay | Admitting: *Deleted

## 2011-12-17 ENCOUNTER — Ambulatory Visit: Payer: Medicare Other | Admitting: Physical Therapy

## 2011-12-17 DIAGNOSIS — R293 Abnormal posture: Secondary | ICD-10-CM | POA: Diagnosis not present

## 2011-12-17 DIAGNOSIS — M25619 Stiffness of unspecified shoulder, not elsewhere classified: Secondary | ICD-10-CM | POA: Diagnosis not present

## 2011-12-17 DIAGNOSIS — M25519 Pain in unspecified shoulder: Secondary | ICD-10-CM | POA: Diagnosis not present

## 2011-12-17 DIAGNOSIS — M6281 Muscle weakness (generalized): Secondary | ICD-10-CM | POA: Diagnosis not present

## 2011-12-17 DIAGNOSIS — R5381 Other malaise: Secondary | ICD-10-CM | POA: Diagnosis not present

## 2011-12-17 DIAGNOSIS — M67919 Unspecified disorder of synovium and tendon, unspecified shoulder: Secondary | ICD-10-CM | POA: Diagnosis not present

## 2011-12-17 DIAGNOSIS — IMO0001 Reserved for inherently not codable concepts without codable children: Secondary | ICD-10-CM | POA: Diagnosis not present

## 2011-12-17 NOTE — Telephone Encounter (Signed)
Message copied by Anselm Jungling on Mon Dec 17, 2011 11:55 AM ------      Message from: Arlan Organ R      Created: Thu Dec 13, 2011  6:40 PM       Call her and tell her labs are ok except that  Thyroid may be low and she needs to come back for more tests.  Let me know when she wants to come back so I can put these in!!!  pete

## 2011-12-17 NOTE — Telephone Encounter (Signed)
Called patient to tell her that her labwork is all ok except her thyroid and may need to come back for more tests.  Asked patient to call us to let us know when she wants to come in.  Left message on personal home phone.

## 2011-12-19 ENCOUNTER — Other Ambulatory Visit (HOSPITAL_BASED_OUTPATIENT_CLINIC_OR_DEPARTMENT_OTHER): Payer: Medicare Other | Admitting: Lab

## 2011-12-19 ENCOUNTER — Other Ambulatory Visit: Payer: Self-pay | Admitting: Hematology & Oncology

## 2011-12-19 ENCOUNTER — Ambulatory Visit: Payer: Medicare Other | Admitting: Physical Therapy

## 2011-12-19 DIAGNOSIS — E039 Hypothyroidism, unspecified: Secondary | ICD-10-CM

## 2011-12-19 DIAGNOSIS — IMO0001 Reserved for inherently not codable concepts without codable children: Secondary | ICD-10-CM | POA: Diagnosis not present

## 2011-12-19 DIAGNOSIS — M6281 Muscle weakness (generalized): Secondary | ICD-10-CM | POA: Diagnosis not present

## 2011-12-19 DIAGNOSIS — R5381 Other malaise: Secondary | ICD-10-CM | POA: Diagnosis not present

## 2011-12-19 DIAGNOSIS — R293 Abnormal posture: Secondary | ICD-10-CM | POA: Diagnosis not present

## 2011-12-19 DIAGNOSIS — M25619 Stiffness of unspecified shoulder, not elsewhere classified: Secondary | ICD-10-CM | POA: Diagnosis not present

## 2011-12-19 LAB — T4: T4, Total: 11.7 ug/dL (ref 5.0–12.5)

## 2011-12-19 LAB — TSH: TSH: 0.01 u[IU]/mL — ABNORMAL LOW (ref 0.350–4.500)

## 2011-12-19 LAB — T3 UPTAKE: T3 Uptake: 37.6 % — ABNORMAL HIGH (ref 22.5–37.0)

## 2011-12-21 ENCOUNTER — Ambulatory Visit (INDEPENDENT_AMBULATORY_CARE_PROVIDER_SITE_OTHER): Payer: Medicare Other | Admitting: General Surgery

## 2011-12-21 ENCOUNTER — Encounter (INDEPENDENT_AMBULATORY_CARE_PROVIDER_SITE_OTHER): Payer: Self-pay | Admitting: General Surgery

## 2011-12-21 VITALS — BP 128/80 | HR 88 | Temp 98.2°F | Resp 18 | Ht 63.0 in | Wt 180.2 lb

## 2011-12-21 DIAGNOSIS — Z09 Encounter for follow-up examination after completed treatment for conditions other than malignant neoplasm: Secondary | ICD-10-CM

## 2011-12-21 NOTE — Progress Notes (Signed)
Subjective:     Patient ID: [name], female   DOB: May 03, 1946, 66 y.o.   MRN: [id number]  HPI 66 yof treated for melanoma who I took her port out a couple weeks ago.  She returns today without complaint.  Her left shoulder had been bothering her and she has now seen ortho. This is doing better and it also feels better after port is out.  Review of Systems     Objective:   Physical Exam Incision healing well without infection    Assessment:     S/p port removal    Plan:     Full activity Return as needed

## 2011-12-26 ENCOUNTER — Ambulatory Visit: Payer: Medicare Other | Admitting: Physical Therapy

## 2011-12-26 DIAGNOSIS — R293 Abnormal posture: Secondary | ICD-10-CM | POA: Diagnosis not present

## 2011-12-26 DIAGNOSIS — M6281 Muscle weakness (generalized): Secondary | ICD-10-CM | POA: Diagnosis not present

## 2011-12-26 DIAGNOSIS — M25619 Stiffness of unspecified shoulder, not elsewhere classified: Secondary | ICD-10-CM | POA: Diagnosis not present

## 2011-12-26 DIAGNOSIS — IMO0001 Reserved for inherently not codable concepts without codable children: Secondary | ICD-10-CM | POA: Diagnosis not present

## 2011-12-26 DIAGNOSIS — R5381 Other malaise: Secondary | ICD-10-CM | POA: Diagnosis not present

## 2011-12-28 ENCOUNTER — Ambulatory Visit: Payer: Medicare Other | Admitting: Physical Therapy

## 2011-12-28 ENCOUNTER — Other Ambulatory Visit: Payer: Self-pay | Admitting: Hematology & Oncology

## 2011-12-28 DIAGNOSIS — M6281 Muscle weakness (generalized): Secondary | ICD-10-CM | POA: Diagnosis not present

## 2011-12-28 DIAGNOSIS — E039 Hypothyroidism, unspecified: Secondary | ICD-10-CM

## 2011-12-28 DIAGNOSIS — IMO0001 Reserved for inherently not codable concepts without codable children: Secondary | ICD-10-CM | POA: Diagnosis not present

## 2011-12-28 DIAGNOSIS — M25619 Stiffness of unspecified shoulder, not elsewhere classified: Secondary | ICD-10-CM | POA: Diagnosis not present

## 2011-12-28 DIAGNOSIS — R293 Abnormal posture: Secondary | ICD-10-CM | POA: Diagnosis not present

## 2011-12-28 DIAGNOSIS — R5381 Other malaise: Secondary | ICD-10-CM | POA: Diagnosis not present

## 2011-12-31 DIAGNOSIS — I10 Essential (primary) hypertension: Secondary | ICD-10-CM | POA: Diagnosis not present

## 2011-12-31 DIAGNOSIS — E7800 Pure hypercholesterolemia, unspecified: Secondary | ICD-10-CM | POA: Diagnosis not present

## 2012-01-11 DIAGNOSIS — M542 Cervicalgia: Secondary | ICD-10-CM | POA: Diagnosis not present

## 2012-01-18 DIAGNOSIS — E7800 Pure hypercholesterolemia, unspecified: Secondary | ICD-10-CM | POA: Diagnosis not present

## 2012-01-18 DIAGNOSIS — I10 Essential (primary) hypertension: Secondary | ICD-10-CM | POA: Diagnosis not present

## 2012-01-18 DIAGNOSIS — M79609 Pain in unspecified limb: Secondary | ICD-10-CM | POA: Diagnosis not present

## 2012-01-18 DIAGNOSIS — H9319 Tinnitus, unspecified ear: Secondary | ICD-10-CM | POA: Diagnosis not present

## 2012-01-22 DIAGNOSIS — M79609 Pain in unspecified limb: Secondary | ICD-10-CM | POA: Diagnosis not present

## 2012-01-22 DIAGNOSIS — Z86718 Personal history of other venous thrombosis and embolism: Secondary | ICD-10-CM | POA: Diagnosis not present

## 2012-01-28 ENCOUNTER — Ambulatory Visit: Payer: Medicare Other | Attending: Hematology & Oncology | Admitting: Physical Therapy

## 2012-01-28 DIAGNOSIS — M25619 Stiffness of unspecified shoulder, not elsewhere classified: Secondary | ICD-10-CM | POA: Insufficient documentation

## 2012-01-28 DIAGNOSIS — IMO0001 Reserved for inherently not codable concepts without codable children: Secondary | ICD-10-CM | POA: Insufficient documentation

## 2012-01-28 DIAGNOSIS — R5381 Other malaise: Secondary | ICD-10-CM | POA: Insufficient documentation

## 2012-01-28 DIAGNOSIS — R293 Abnormal posture: Secondary | ICD-10-CM | POA: Diagnosis not present

## 2012-01-28 DIAGNOSIS — M6281 Muscle weakness (generalized): Secondary | ICD-10-CM | POA: Insufficient documentation

## 2012-01-30 ENCOUNTER — Ambulatory Visit: Payer: Medicare Other | Admitting: Physical Therapy

## 2012-01-31 DIAGNOSIS — L821 Other seborrheic keratosis: Secondary | ICD-10-CM | POA: Diagnosis not present

## 2012-01-31 DIAGNOSIS — D239 Other benign neoplasm of skin, unspecified: Secondary | ICD-10-CM | POA: Diagnosis not present

## 2012-01-31 DIAGNOSIS — Z85820 Personal history of malignant melanoma of skin: Secondary | ICD-10-CM | POA: Diagnosis not present

## 2012-02-04 ENCOUNTER — Ambulatory Visit: Payer: Medicare Other | Attending: Hematology & Oncology | Admitting: Physical Therapy

## 2012-02-04 DIAGNOSIS — R293 Abnormal posture: Secondary | ICD-10-CM | POA: Insufficient documentation

## 2012-02-04 DIAGNOSIS — M6281 Muscle weakness (generalized): Secondary | ICD-10-CM | POA: Insufficient documentation

## 2012-02-04 DIAGNOSIS — M25619 Stiffness of unspecified shoulder, not elsewhere classified: Secondary | ICD-10-CM | POA: Insufficient documentation

## 2012-02-04 DIAGNOSIS — IMO0001 Reserved for inherently not codable concepts without codable children: Secondary | ICD-10-CM | POA: Insufficient documentation

## 2012-02-04 DIAGNOSIS — R5381 Other malaise: Secondary | ICD-10-CM | POA: Insufficient documentation

## 2012-02-08 ENCOUNTER — Ambulatory Visit: Payer: Medicare Other | Admitting: Physical Therapy

## 2012-02-08 DIAGNOSIS — R5381 Other malaise: Secondary | ICD-10-CM | POA: Diagnosis not present

## 2012-02-08 DIAGNOSIS — R293 Abnormal posture: Secondary | ICD-10-CM | POA: Diagnosis not present

## 2012-02-08 DIAGNOSIS — M25619 Stiffness of unspecified shoulder, not elsewhere classified: Secondary | ICD-10-CM | POA: Diagnosis not present

## 2012-02-08 DIAGNOSIS — M6281 Muscle weakness (generalized): Secondary | ICD-10-CM | POA: Diagnosis not present

## 2012-02-08 DIAGNOSIS — IMO0001 Reserved for inherently not codable concepts without codable children: Secondary | ICD-10-CM | POA: Diagnosis not present

## 2012-02-11 ENCOUNTER — Ambulatory Visit: Payer: Medicare Other | Admitting: Physical Therapy

## 2012-02-11 DIAGNOSIS — M25619 Stiffness of unspecified shoulder, not elsewhere classified: Secondary | ICD-10-CM | POA: Diagnosis not present

## 2012-02-11 DIAGNOSIS — R5381 Other malaise: Secondary | ICD-10-CM | POA: Diagnosis not present

## 2012-02-11 DIAGNOSIS — IMO0001 Reserved for inherently not codable concepts without codable children: Secondary | ICD-10-CM | POA: Diagnosis not present

## 2012-02-11 DIAGNOSIS — R293 Abnormal posture: Secondary | ICD-10-CM | POA: Diagnosis not present

## 2012-02-11 DIAGNOSIS — M6281 Muscle weakness (generalized): Secondary | ICD-10-CM | POA: Diagnosis not present

## 2012-02-13 ENCOUNTER — Ambulatory Visit: Payer: Medicare Other | Admitting: Physical Therapy

## 2012-02-13 DIAGNOSIS — R5381 Other malaise: Secondary | ICD-10-CM | POA: Diagnosis not present

## 2012-02-13 DIAGNOSIS — M25619 Stiffness of unspecified shoulder, not elsewhere classified: Secondary | ICD-10-CM | POA: Diagnosis not present

## 2012-02-13 DIAGNOSIS — M6281 Muscle weakness (generalized): Secondary | ICD-10-CM | POA: Diagnosis not present

## 2012-02-13 DIAGNOSIS — IMO0001 Reserved for inherently not codable concepts without codable children: Secondary | ICD-10-CM | POA: Diagnosis not present

## 2012-02-13 DIAGNOSIS — R293 Abnormal posture: Secondary | ICD-10-CM | POA: Diagnosis not present

## 2012-02-18 ENCOUNTER — Ambulatory Visit: Payer: Medicare Other | Admitting: Physical Therapy

## 2012-02-18 DIAGNOSIS — R293 Abnormal posture: Secondary | ICD-10-CM | POA: Diagnosis not present

## 2012-02-18 DIAGNOSIS — IMO0001 Reserved for inherently not codable concepts without codable children: Secondary | ICD-10-CM | POA: Diagnosis not present

## 2012-02-18 DIAGNOSIS — M25619 Stiffness of unspecified shoulder, not elsewhere classified: Secondary | ICD-10-CM | POA: Diagnosis not present

## 2012-02-18 DIAGNOSIS — R5381 Other malaise: Secondary | ICD-10-CM | POA: Diagnosis not present

## 2012-02-18 DIAGNOSIS — M6281 Muscle weakness (generalized): Secondary | ICD-10-CM | POA: Diagnosis not present

## 2012-02-19 ENCOUNTER — Telehealth (INDEPENDENT_AMBULATORY_CARE_PROVIDER_SITE_OTHER): Payer: Self-pay | Admitting: General Surgery

## 2012-02-19 NOTE — Telephone Encounter (Signed)
Pt calling to report the scar (where her PAC removed last [name]) is today burning and appears swollen/red on one side.  She has occasionally put lotion or cream on the scar for itching and she fully understands the scar takes up to a year to fully mature.  She is concerned with the pain and swelling of today.  Advised ice pack to site of swelling and would forward this to Dr. Dwain Sarna as well.  She will call back for worsening symptoms.

## 2012-02-20 ENCOUNTER — Ambulatory Visit: Payer: Medicare Other | Admitting: Physical Therapy

## 2012-02-20 DIAGNOSIS — M25619 Stiffness of unspecified shoulder, not elsewhere classified: Secondary | ICD-10-CM | POA: Diagnosis not present

## 2012-02-20 DIAGNOSIS — IMO0001 Reserved for inherently not codable concepts without codable children: Secondary | ICD-10-CM | POA: Diagnosis not present

## 2012-02-20 DIAGNOSIS — M6281 Muscle weakness (generalized): Secondary | ICD-10-CM | POA: Diagnosis not present

## 2012-02-20 DIAGNOSIS — R5381 Other malaise: Secondary | ICD-10-CM | POA: Diagnosis not present

## 2012-02-20 DIAGNOSIS — R293 Abnormal posture: Secondary | ICD-10-CM | POA: Diagnosis not present

## 2012-02-21 NOTE — Telephone Encounter (Signed)
I would be happy to look at this again

## 2012-02-22 DIAGNOSIS — M5030 Other cervical disc degeneration, unspecified cervical region: Secondary | ICD-10-CM | POA: Diagnosis not present

## 2012-02-25 ENCOUNTER — Encounter: Payer: Medicare Other | Admitting: Physical Therapy

## 2012-02-27 ENCOUNTER — Encounter: Payer: Medicare Other | Admitting: Physical Therapy

## 2012-03-03 DIAGNOSIS — M5030 Other cervical disc degeneration, unspecified cervical region: Secondary | ICD-10-CM | POA: Diagnosis not present

## 2012-03-05 DIAGNOSIS — M5030 Other cervical disc degeneration, unspecified cervical region: Secondary | ICD-10-CM | POA: Diagnosis not present

## 2012-03-10 ENCOUNTER — Ambulatory Visit
Admission: RE | Admit: 2012-03-10 | Discharge: 2012-03-10 | Disposition: A | Discharge status: Home / Self Care | Payer: Medicare Other | Source: Ambulatory Visit | Attending: Radiation Oncology | Admitting: Radiation Oncology

## 2012-03-10 ENCOUNTER — Encounter: Payer: Self-pay | Admitting: Radiation Oncology

## 2012-03-10 VITALS — BP 110/68 | HR 72 | Temp 98.2°F | Resp 18 | Wt 190.0 lb

## 2012-03-10 DIAGNOSIS — C801 Malignant (primary) neoplasm, unspecified: Secondary | ICD-10-CM

## 2012-03-10 NOTE — Progress Notes (Signed)
HERE TODAY FOR FU OF MELANOMA RIGHT AXILLA/ RIGHT UPPER BACK.  NO PROBLEMS WITH TX AREA SINCE COMPLETED TX.  SKIN LOOKS GOOD.  HAD A LEFT BREAST BIOPSY IN DEC 2012.  RESULTS WERE BENIGN.  HAD PORT A CATH REMOVED END OF January, STILL HAS SOME SORENESS AND ITCHING IN THIS AREA.  SURGEON TOLD HER THIS WAS NORMAL.

## 2012-03-10 NOTE — Progress Notes (Signed)
  Radiation Oncology         [phone]) [phone] ________________________________  Name: DEZARAY SHIBUYA MRN: [id number]  Date: 03/10/2012  DOB: 03-27-1946  Follow-Up Visit Note  CC: [name], MD, MD  Jeanmarie Plant, MD  Diagnosis:   Melanoma  Interval Since Last Radiation:  7 months  Narrative:  The patient returns today for routine follow-up.  She is doing quite well at this time. The patient continues regular followup with Dr. Drue Dun and her dermatologist. She denies any pain in the right axilla region or  swelling in her right arm or hand. She  denies any new bony pain headaches dizziness or blurred vision.                              ALLERGIES:  is allergic to sulfa drugs cross reactors.  Meds: Current Outpatient Prescriptions  Medication Sig Dispense Refill  . albuterol (PROVENTIL HFA;VENTOLIN HFA) 108 (90 BASE) MCG/ACT inhaler Inhale 2 puffs into the lungs every 6 (six) hours as needed.        . cyclobenzaprine (FLEXERIL) 10 MG tablet Take 10 mg by mouth 3 (three) times daily as needed.        . Ergocalciferol (DRISDOL PO) Take 1.25 mg by mouth once a week.       . gabapentin (NEURONTIN) 300 MG capsule Take 300 mg by mouth Nightly.       . loratadine (CLARITIN) 10 MG tablet Take 10 mg by mouth daily as needed.        . montelukast (SINGULAIR) 10 MG tablet Take 10 mg by mouth every evening.       . olmesartan-hydrochlorothiazide (BENICAR HCT) 20-12.5 MG per tablet Take 1 tablet by mouth daily.       . potassium chloride SA (K-DUR,KLOR-CON) 20 MEQ tablet Take 20 mEq by mouth daily.         Physical Findings: The patient is in no acute distress. Patient is alert and oriented.  weight is 190 lb (86.183 kg). Her oral temperature is 98.2 F (36.8 C). Her blood pressure is 110/68 and her pulse is 72. Her respiration is 18. Marland Kitchen  No palpable cervical supraclavicular or axillary adenopathy. The lungs are clear to auscultation. The heart has a regular rhythm and rate. Examination of the  patient's scar along the right upper back shows no signs recurrence. She has good range movement in the right shoulder. There is no obvious lymphedema in the right arm or hand. The patient's skin shows minimal radiation changes in the right axillary region.  Lab Findings: Lab Results  Component Value Date   WBC 4.7 12/10/2011   HGB 12.5 12/10/2011   HCT 37.4 12/10/2011   MCV 91 12/10/2011   PLT 172 12/10/2011    @LASTCHEM @  Radiographic Findings: No results found.  Impression:  The patient is recovered from the effects of radiation.  No clinical evidence or recurrence.  Plan:  When necessary followup. The patient will continue close followup with Dr. Myna Hidalgo.  _____________________________________  -----------------------------------  Billie Lade, PhD, MD

## 2012-03-11 DIAGNOSIS — M542 Cervicalgia: Secondary | ICD-10-CM | POA: Diagnosis not present

## 2012-03-14 ENCOUNTER — Ambulatory Visit (HOSPITAL_BASED_OUTPATIENT_CLINIC_OR_DEPARTMENT_OTHER): Payer: Medicare Other | Admitting: Hematology & Oncology

## 2012-03-14 ENCOUNTER — Other Ambulatory Visit (HOSPITAL_BASED_OUTPATIENT_CLINIC_OR_DEPARTMENT_OTHER): Payer: Medicare Other | Admitting: Lab

## 2012-03-14 VITALS — BP 115/72 | HR 66 | Temp 97.2°F | Ht 63.0 in | Wt 190.0 lb

## 2012-03-14 DIAGNOSIS — C439 Malignant melanoma of skin, unspecified: Secondary | ICD-10-CM | POA: Diagnosis not present

## 2012-03-14 DIAGNOSIS — M542 Cervicalgia: Secondary | ICD-10-CM

## 2012-03-14 DIAGNOSIS — E039 Hypothyroidism, unspecified: Secondary | ICD-10-CM | POA: Diagnosis not present

## 2012-03-14 DIAGNOSIS — C4360 Malignant melanoma of unspecified upper limb, including shoulder: Secondary | ICD-10-CM | POA: Diagnosis not present

## 2012-03-14 LAB — TSH: TSH: 0.069 u[IU]/mL — ABNORMAL LOW (ref 0.350–4.500)

## 2012-03-14 LAB — COMPREHENSIVE METABOLIC PANEL
ALT: 14 U/L (ref 0–35)
AST: 12 U/L (ref 0–37)
Albumin: 4.1 g/dL (ref 3.5–5.2)
Alkaline Phosphatase: 62 U/L (ref 39–117)
BUN: 18 mg/dL (ref 6–23)
CO2: 30 mEq/L (ref 19–32)
Calcium: 9.7 mg/dL (ref 8.4–10.5)
Chloride: 106 mEq/L (ref 96–112)
Creatinine, Ser: 0.78 mg/dL (ref 0.50–1.10)
Glucose, Bld: 77 mg/dL (ref 70–99)
Potassium: 3.8 mEq/L (ref 3.5–5.3)
Sodium: 142 mEq/L (ref 135–145)
Total Bilirubin: 0.5 mg/dL (ref 0.3–1.2)
Total Protein: 6.4 g/dL (ref 6.0–8.3)

## 2012-03-14 LAB — CBC WITH DIFFERENTIAL (CANCER CENTER ONLY)
BASO#: 0 10*3/uL (ref 0.0–0.2)
BASO%: 0.2 % (ref 0.0–2.0)
EOS%: 3.5 % (ref 0.0–7.0)
Eosinophils Absolute: 0.2 10*3/uL (ref 0.0–0.5)
HCT: 39.6 % (ref 34.8–46.6)
HGB: 13.2 g/dL (ref 11.6–15.9)
LYMPH#: 1.2 10*3/uL (ref 0.9–3.3)
LYMPH%: 25.9 % (ref 14.0–48.0)
MCH: 30.8 pg (ref 26.0–34.0)
MCHC: 33.3 g/dL (ref 32.0–36.0)
MCV: 93 fL (ref 81–101)
MONO#: 0.4 10*3/uL (ref 0.1–0.9)
MONO%: 8.4 % (ref 0.0–13.0)
NEUT#: 2.8 10*3/uL (ref 1.5–6.5)
NEUT%: 62 % (ref 39.6–80.0)
Platelets: 157 10*3/uL (ref 145–400)
RBC: 4.28 10*6/uL (ref 3.70–5.32)
RDW: 13 % (ref 11.1–15.7)
WBC: 4.5 10*3/uL (ref 3.9–10.0)

## 2012-03-14 LAB — LACTATE DEHYDROGENASE: LDH: 144 U/L (ref 94–250)

## 2012-03-14 LAB — T4, FREE: Free T4: 1.07 ng/dL (ref 0.80–1.80)

## 2012-03-14 LAB — T3 UPTAKE: T3 Uptake: 29.9 % (ref 22.5–37.0)

## 2012-03-14 NOTE — Progress Notes (Signed)
This office note has been dictated.

## 2012-03-14 NOTE — Progress Notes (Signed)
CC:   Jeanmarie Plant, MD Lubertha Basque. Jerl Santos, M.D. Juanetta Gosling, MD  DIAGNOSIS:  Stage IIIC (T3b N3 M0) melanoma of the right shoulder.  CURRENT THERAPY:  Observation.  INTERIM HISTORY:  Ms. Florendo comes in for followup.  She has some issues with her neck.  She sees Dr. Jerl Santos.  He is giving her physical therapy.  She is making some slow progress.  Otherwise, she is feeling okay.  She is eating okay.  She is gaining weight which she is not too happy about.  She is having no problems pain wise outside of that with her neck.  Dr. Jerl Santos did do an MRI of the neck.  Ms. Mctier says that the MRI showed a lot of arthritis and disk disease.  Unfortunately, this must have be done at his office as there is no record in the Epic system.  She is on Zanaflex.  She is going to see Dr. Clarene Duke in about a month to have her liver functions checked.  She has had no leg swelling.  There has been no lymphedema of the right arm.  She has had no headache.  The patient will be going to [place] for a sister's wedding over [facility] Day.  PHYSICAL EXAM:  General:  This is a well-developed, well-nourished white female in no obvious distress.  Vital signs:  97.2, pulse 66, respiratory rate 20, blood pressure 115/72.  Weight is 190.  Head and neck:  Shows a normocephalic, atraumatic skull.  There are no ocular or oral lesions.  There are no palpable cervical or supraclavicular lymph nodes.  Lungs:  Clear bilaterally.  Cardiac:  Regular rate and rhythm with a normal S1 and S2.  There are no murmurs or rubs, or bruits. Abdomen:  Soft with good bowel sounds.  There is no palpable abdominal mass.  There is no palpable hepatosplenomegaly.  Back:  Shows the wide local excision scar in the right posterior shoulder.  No nodularity or suspicious lesions were noted.  She has no tenderness over the spine, ribs, or hips.  Extremities:  Shows no clubbing, cyanosis or edema. Axillary exam shows no bilateral  axillary adenopathy.  There may be some slight firmness in the right axilla from past surgery and radiation. Her neck exam does show marked decreased range of motion of her neck. Her paravertebral muscles in the cervical spine are quite tight.  LABORATORY STUDIES:  White cell count is 4.5, hemoglobin 13.2, hematocrit 39.6, platelet count 157.  The LFTs are normal.  LDH is 144.  IMPRESSION:  Ms. Folsom is a 66 year old white female with stage IIIC melanoma of the right shoulder.  She underwent wide local excision.  She had right axillary lymphadenectomy done.  She had 8 lymph nodes that were positive.  Her tumor BRAF negative.  She received high-dose interferon over 4 months.  This was tolerated pretty well.  She then underwent radiation therapy to the right axilla to help with local control.  I do not see any evidence of recurrent disease at this point.  I do not see an indication for any scans that we need to do routinely.  We will plan to get her back in another 4 months.  We will try to get her back after the Labor Day weekend.    ______________________________ Josph Macho, M.D. PRE/MEDQ  D:  03/14/2012  T:  03/14/2012  Job:  [id number]

## 2012-03-17 ENCOUNTER — Ambulatory Visit: Payer: Medicare Other | Admitting: Radiation Oncology

## 2012-03-20 ENCOUNTER — Telehealth: Payer: Self-pay | Admitting: *Deleted

## 2012-03-20 NOTE — Telephone Encounter (Signed)
Message copied by Anselm Jungling on Thu Mar 20, 2012 12:56 PM ------      Message from: Arlan Organ R      Created: Wed Mar 19, 2012  9:32 PM       Call - labs and thyroid functions are normal!!! pete

## 2012-03-20 NOTE — Telephone Encounter (Signed)
Called patient and left voicemail on personal answering machine that her labwork and thyroid function was all normal

## 2012-03-24 ENCOUNTER — Ambulatory Visit: Payer: Medicare Other | Admitting: Radiation Oncology

## 2012-04-18 DIAGNOSIS — R5383 Other fatigue: Secondary | ICD-10-CM | POA: Diagnosis not present

## 2012-04-18 DIAGNOSIS — R7309 Other abnormal glucose: Secondary | ICD-10-CM | POA: Diagnosis not present

## 2012-04-18 DIAGNOSIS — R5381 Other malaise: Secondary | ICD-10-CM | POA: Diagnosis not present

## 2012-04-18 DIAGNOSIS — E039 Hypothyroidism, unspecified: Secondary | ICD-10-CM | POA: Diagnosis not present

## 2012-04-18 DIAGNOSIS — R7989 Other specified abnormal findings of blood chemistry: Secondary | ICD-10-CM | POA: Diagnosis not present

## 2012-04-18 DIAGNOSIS — E7800 Pure hypercholesterolemia, unspecified: Secondary | ICD-10-CM | POA: Diagnosis not present

## 2012-06-13 ENCOUNTER — Other Ambulatory Visit (HOSPITAL_BASED_OUTPATIENT_CLINIC_OR_DEPARTMENT_OTHER): Payer: Medicare Other | Admitting: Lab

## 2012-06-13 ENCOUNTER — Ambulatory Visit (HOSPITAL_BASED_OUTPATIENT_CLINIC_OR_DEPARTMENT_OTHER): Payer: Medicare Other | Admitting: Medical

## 2012-06-13 VITALS — BP 103/71 | HR 70 | Temp 97.5°F | Resp 18 | Ht 63.0 in | Wt 201.0 lb

## 2012-06-13 DIAGNOSIS — C439 Malignant melanoma of skin, unspecified: Secondary | ICD-10-CM | POA: Diagnosis not present

## 2012-06-13 DIAGNOSIS — Z923 Personal history of irradiation: Secondary | ICD-10-CM | POA: Diagnosis not present

## 2012-06-13 DIAGNOSIS — C773 Secondary and unspecified malignant neoplasm of axilla and upper limb lymph nodes: Secondary | ICD-10-CM | POA: Diagnosis not present

## 2012-06-13 DIAGNOSIS — C801 Malignant (primary) neoplasm, unspecified: Secondary | ICD-10-CM | POA: Diagnosis not present

## 2012-06-13 DIAGNOSIS — C4360 Malignant melanoma of unspecified upper limb, including shoulder: Secondary | ICD-10-CM

## 2012-06-13 DIAGNOSIS — E039 Hypothyroidism, unspecified: Secondary | ICD-10-CM

## 2012-06-13 LAB — CBC WITH DIFFERENTIAL (CANCER CENTER ONLY)
BASO#: 0 10*3/uL (ref 0.0–0.2)
BASO%: 0.2 % (ref 0.0–2.0)
EOS%: 4.4 % (ref 0.0–7.0)
Eosinophils Absolute: 0.3 10*3/uL (ref 0.0–0.5)
HCT: 37.9 % (ref 34.8–46.6)
HGB: 12.6 g/dL (ref 11.6–15.9)
LYMPH#: 1.5 10*3/uL (ref 0.9–3.3)
LYMPH%: 26.6 % (ref 14.0–48.0)
MCH: 30.7 pg (ref 26.0–34.0)
MCHC: 33.2 g/dL (ref 32.0–36.0)
MCV: 92 fL (ref 81–101)
MONO#: 0.4 10*3/uL (ref 0.1–0.9)
MONO%: 6.7 % (ref 0.0–13.0)
NEUT#: 3.5 10*3/uL (ref 1.5–6.5)
NEUT%: 62.1 % (ref 39.6–80.0)
Platelets: 171 10*3/uL (ref 145–400)
RBC: 4.1 10*6/uL (ref 3.70–5.32)
RDW: 12.7 % (ref 11.1–15.7)
WBC: 5.7 10*3/uL (ref 3.9–10.0)

## 2012-06-13 LAB — COMPREHENSIVE METABOLIC PANEL
ALT: 15 U/L (ref 0–35)
AST: 11 U/L (ref 0–37)
Albumin: 3.8 g/dL (ref 3.5–5.2)
Alkaline Phosphatase: 60 U/L (ref 39–117)
BUN: 19 mg/dL (ref 6–23)
CO2: 29 mEq/L (ref 19–32)
Calcium: 9.4 mg/dL (ref 8.4–10.5)
Chloride: 107 mEq/L (ref 96–112)
Creatinine, Ser: 0.82 mg/dL (ref 0.50–1.10)
Glucose, Bld: 82 mg/dL (ref 70–99)
Potassium: 4.1 mEq/L (ref 3.5–5.3)
Sodium: 145 mEq/L (ref 135–145)
Total Bilirubin: 0.5 mg/dL (ref 0.3–1.2)
Total Protein: 6.5 g/dL (ref 6.0–8.3)

## 2012-06-13 LAB — LACTATE DEHYDROGENASE: LDH: 157 U/L (ref 94–250)

## 2012-06-13 NOTE — Progress Notes (Signed)
Patient Name : [name], [name] MR #[id number] DOB: May 29, 1946 Encounter Date: 06/13/2012 Dictated by Eunice Blase, PA-C   diagnosis: Stage IIIc (T3b N3, M0) melanoma of the right shoulder   Current therapy: Observation.  Interim history: [name] presents today for an office followup visit.  [name] continued to have some issues with her neck and is seeing Dr. Jerl Santos.  She has been prescribed Zanaflex .  She also completed a course of physical therapy.  She otherwise is doing quite well.  She is eating okay.  She is not having any nausea, vomiting, diarrhea, constipation.  She denies any obvious, bleeding.  [name] not having any other pain besides her neck.  She does not report any leg swelling.  There has been no lymphedema of the right arm.  She denies any headaches or visual changes.  She denies any cough, chest pain, shortness of breath, fevers, chills, or night sweats.    Review of Systems: Neck pain, otherwise: Pt. Denies any changes in their vision, hearing, adenopathy, fevers, chills, nausea, vomiting, diarrhea, constipation, chest pain, shortness of breath, passing blood, passing out, blacking out,  any changes in skin, joints, neurologic or psychiatric except as noted.  Physical Exam: [name], 66 year old, well-developed, well-nourished, female, in no obvious distress.   Vitals: Temperature 97.5 degrees, pulse 70, respirations 18, blood pressure 103/71, weight 201 pounds HEENT reveals a normocephalic, atraumatic skull, no scleral icterus, no oral lesions  Neck is supple without any cervical or supraclavicular adenopathy.  Lungs are clear to auscultation bilaterally. There are no wheezes, rales or rhonci Cardiac is regular rate and rhythm with a normal S1 and S2. There are no murmurs, rubs, or bruits.  Abdomen is soft with good bowel sounds, there is no palpable mass. There is no palpable hepatosplenomegaly. There is no palpable fluid wave.  Musculoskeletal no tenderness of  the spine, ribs, or hips.  Extremities there are no clubbing, cyanosis, or edema.  Skin no petechia, purpura or ecchymosis Neurologic is nonfocal.  Laboratory Data: White count 5.7, hemoglobin 12.6, hematocrit 37.9, platelets 171,000  Current Outpatient Prescriptions on File Prior to Visit  Medication Sig Dispense Refill  . albuterol (PROVENTIL HFA;VENTOLIN HFA) 108 (90 BASE) MCG/ACT inhaler Inhale 2 puffs into the lungs every 6 (six) hours as needed.        . Ergocalciferol (DRISDOL PO) Take 1.25 mg by mouth once a week.       . gabapentin (NEURONTIN) 300 MG capsule Take 300 mg by mouth Nightly.       . loratadine (CLARITIN) 10 MG tablet Take 10 mg by mouth daily as needed.        . montelukast (SINGULAIR) 10 MG tablet Take 10 mg by mouth every evening.       . olmesartan-hydrochlorothiazide (BENICAR HCT) 20-12.5 MG per tablet Take 1 tablet by mouth daily.       . potassium chloride SA (K-DUR,KLOR-CON) 20 MEQ tablet Take 20 mEq by mouth daily.       Marland Kitchen tiZANidine (ZANAFLEX) 4 MG capsule Take 4 mg by mouth daily.        Assessment/Plan: [name], 66 year old, female, with the following issues: #1.  History of stage IIIC, melanoma of the right shoulder.  She did undergo a wide local excision.  She had right axillary lymphadenectomy done.  She had 8 lymph nodes that were positive.  Her tumor.  BRAF was negative.  She received high-dose interferon ever 4 months.  She then  underwent radiation therapy to the right axilla to help with local control.  Currently, I do not see any evidence of recurrent disease.  She will continue to followup with her dermatologist on a yearly basis.  #2 followup [name] will follow back up with [place] in 4 months, but before then should there be questions or concerns.

## 2012-08-05 ENCOUNTER — Other Ambulatory Visit: Payer: Self-pay | Admitting: Dermatology

## 2012-08-05 DIAGNOSIS — D2360 Other benign neoplasm of skin of unspecified upper limb, including shoulder: Secondary | ICD-10-CM | POA: Diagnosis not present

## 2012-08-05 DIAGNOSIS — D1801 Hemangioma of skin and subcutaneous tissue: Secondary | ICD-10-CM | POA: Diagnosis not present

## 2012-08-05 DIAGNOSIS — D485 Neoplasm of uncertain behavior of skin: Secondary | ICD-10-CM | POA: Diagnosis not present

## 2012-08-05 DIAGNOSIS — D235 Other benign neoplasm of skin of trunk: Secondary | ICD-10-CM | POA: Diagnosis not present

## 2012-08-05 DIAGNOSIS — Z85820 Personal history of malignant melanoma of skin: Secondary | ICD-10-CM | POA: Diagnosis not present

## 2012-08-05 DIAGNOSIS — L821 Other seborrheic keratosis: Secondary | ICD-10-CM | POA: Diagnosis not present

## 2012-08-05 DIAGNOSIS — L84 Corns and callosities: Secondary | ICD-10-CM | POA: Diagnosis not present

## 2012-08-13 DIAGNOSIS — R0989 Other specified symptoms and signs involving the circulatory and respiratory systems: Secondary | ICD-10-CM | POA: Diagnosis not present

## 2012-08-13 DIAGNOSIS — R05 Cough: Secondary | ICD-10-CM | POA: Diagnosis not present

## 2012-08-13 DIAGNOSIS — R062 Wheezing: Secondary | ICD-10-CM | POA: Diagnosis not present

## 2012-08-13 DIAGNOSIS — R0609 Other forms of dyspnea: Secondary | ICD-10-CM | POA: Diagnosis not present

## 2012-08-13 DIAGNOSIS — R059 Cough, unspecified: Secondary | ICD-10-CM | POA: Diagnosis not present

## 2012-08-18 DIAGNOSIS — E7800 Pure hypercholesterolemia, unspecified: Secondary | ICD-10-CM | POA: Diagnosis not present

## 2012-08-18 DIAGNOSIS — R0989 Other specified symptoms and signs involving the circulatory and respiratory systems: Secondary | ICD-10-CM | POA: Diagnosis not present

## 2012-08-18 DIAGNOSIS — J45909 Unspecified asthma, uncomplicated: Secondary | ICD-10-CM | POA: Diagnosis not present

## 2012-08-18 DIAGNOSIS — R059 Cough, unspecified: Secondary | ICD-10-CM | POA: Diagnosis not present

## 2012-08-18 DIAGNOSIS — R7989 Other specified abnormal findings of blood chemistry: Secondary | ICD-10-CM | POA: Diagnosis not present

## 2012-08-18 DIAGNOSIS — R05 Cough: Secondary | ICD-10-CM | POA: Diagnosis not present

## 2012-08-18 DIAGNOSIS — R5381 Other malaise: Secondary | ICD-10-CM | POA: Diagnosis not present

## 2012-08-18 DIAGNOSIS — R062 Wheezing: Secondary | ICD-10-CM | POA: Diagnosis not present

## 2012-08-18 DIAGNOSIS — R0609 Other forms of dyspnea: Secondary | ICD-10-CM | POA: Diagnosis not present

## 2012-08-27 DIAGNOSIS — H903 Sensorineural hearing loss, bilateral: Secondary | ICD-10-CM | POA: Diagnosis not present

## 2012-08-27 DIAGNOSIS — H9319 Tinnitus, unspecified ear: Secondary | ICD-10-CM | POA: Diagnosis not present

## 2012-10-17 ENCOUNTER — Ambulatory Visit (HOSPITAL_BASED_OUTPATIENT_CLINIC_OR_DEPARTMENT_OTHER): Payer: Medicare Other | Admitting: Hematology & Oncology

## 2012-10-17 ENCOUNTER — Other Ambulatory Visit (HOSPITAL_BASED_OUTPATIENT_CLINIC_OR_DEPARTMENT_OTHER): Payer: Medicare Other | Admitting: Lab

## 2012-10-17 VITALS — BP 105/55 | HR 68 | Temp 98.1°F | Resp 18 | Ht 63.0 in | Wt 208.0 lb

## 2012-10-17 DIAGNOSIS — C439 Malignant melanoma of skin, unspecified: Secondary | ICD-10-CM

## 2012-10-17 DIAGNOSIS — C4360 Malignant melanoma of unspecified upper limb, including shoulder: Secondary | ICD-10-CM

## 2012-10-17 DIAGNOSIS — C779 Secondary and unspecified malignant neoplasm of lymph node, unspecified: Secondary | ICD-10-CM

## 2012-10-17 LAB — COMPREHENSIVE METABOLIC PANEL
ALT: 14 U/L (ref 0–35)
AST: 11 U/L (ref 0–37)
Albumin: 4.1 g/dL (ref 3.5–5.2)
Alkaline Phosphatase: 56 U/L (ref 39–117)
BUN: 15 mg/dL (ref 6–23)
CO2: 31 mEq/L (ref 19–32)
Calcium: 8.9 mg/dL (ref 8.4–10.5)
Chloride: 106 mEq/L (ref 96–112)
Creatinine, Ser: 0.67 mg/dL (ref 0.50–1.10)
Glucose, Bld: 78 mg/dL (ref 70–99)
Potassium: 3.8 mEq/L (ref 3.5–5.3)
Sodium: 143 mEq/L (ref 135–145)
Total Bilirubin: 0.5 mg/dL (ref 0.3–1.2)
Total Protein: 6.2 g/dL (ref 6.0–8.3)

## 2012-10-17 LAB — CBC WITH DIFFERENTIAL (CANCER CENTER ONLY)
BASO#: 0 10*3/uL (ref 0.0–0.2)
BASO%: 0.2 % (ref 0.0–2.0)
EOS%: 4.4 % (ref 0.0–7.0)
Eosinophils Absolute: 0.2 10*3/uL (ref 0.0–0.5)
HCT: 39.2 % (ref 34.8–46.6)
HGB: 12.8 g/dL (ref 11.6–15.9)
LYMPH#: 1.4 10*3/uL (ref 0.9–3.3)
LYMPH%: 25.2 % (ref 14.0–48.0)
MCH: 30.5 pg (ref 26.0–34.0)
MCHC: 32.7 g/dL (ref 32.0–36.0)
MCV: 93 fL (ref 81–101)
MONO#: 0.5 10*3/uL (ref 0.1–0.9)
MONO%: 9.5 % (ref 0.0–13.0)
NEUT#: 3.3 10*3/uL (ref 1.5–6.5)
NEUT%: 60.7 % (ref 39.6–80.0)
Platelets: 174 10*3/uL (ref 145–400)
RBC: 4.2 10*6/uL (ref 3.70–5.32)
RDW: 12.6 % (ref 11.1–15.7)
WBC: 5.5 10*3/uL (ref 3.9–10.0)

## 2012-10-17 LAB — LACTATE DEHYDROGENASE: LDH: 151 U/L (ref 94–250)

## 2012-10-17 NOTE — Progress Notes (Signed)
This office note has been dictated.

## 2012-10-18 NOTE — Progress Notes (Signed)
CC:   Jeanmarie Plant, MD Juanetta Gosling, MD Lubertha Basque Jerl Santos, M.D.  DIAGNOSIS:  Stage IIIC (T3b N3 M0) melanoma of the right shoulder.  CURRENT THERAPY:  Observation.  INTERIM HISTORY:  Ms. Nolet comes in for followup.  She is feeling well.  She is still having the neck issues.  She has been doing physical therapy on and off.  She is not taking any muscle relaxers any more as these really have not helped.  She has had no problems with cough.  She does have some seasonal allergies.  There has been no abdominal pain.  She has had no change in bowel or bladder habits.  She has had no problems with arm swelling.  She has not noticed any kind of rashes.  The patient did have some moles removed by, I think, Dr. Yetta Barre of [facility].  These apparently have been negative.  She has occasional headaches which she feels are mostly from her neck.  The moles that were removed were dysplastic nevi.  These were removed in October.  PHYSICAL EXAMINATION:  General:  This is a well-developed, well- nourished white female in no obvious distress.  Vital signs: Temperature of 98.1, pulse rate 68, respiratory rate 18, blood pressure 105/55.  Weight is 208.  Head and neck:  Normocephalic, atraumatic skull.  There are no ocular or oral lesions.  There are no palpable cervical or supraclavicular lymph nodes.  Lungs:  Clear bilaterally.  No rales, wheeze, or rhonchi are noted.  Cardiac:  Regular rate and rhythm, with a normal S1 and S2.  There are no murmurs, rubs or bruits. Abdomen:  Soft.  She has good bowel sounds.  There is no fluid wave. There is no palpable hepatosplenomegaly.  Back:  Shows a wide local excision on the right upper back.  No tenderness is noted over the spine, ribs, or hips.  Axillary:  Shows no bilateral axillary adenopathy.  Extremities:  Show some varicose veins.  No edema is noted. Skin:  Shows no subcutaneous nodules.  There are no suspicious  lesions. Neurologic:  No focal neurological deficits are noted.  LABORATORY STUDIES:  White cell count is 5.5, hemoglobin 12.8, hematocrit 39.2, platelet count 174,000.  IMPRESSION:  Ms. Baria is a 66 year old white female with a history of stage IIIC melanoma of the right shoulder.  She had this resected.  She had 8 lymph nodes that were positive.  Of note, her tumor was BRAF negative.  She received high-dose interferon over a 72-month period.  She then underwent radiation therapy to the right axilla.  So far, she has had no evidence of recurrent disease.  She completed her adjuvant interferon, I think, back in December 2012.  I do not see need for any x-rays right now.  We will go ahead and plan to get her back to see [place] in another 4 months.    ______________________________ Josph Macho, M.D. PRE/MEDQ  D:  10/17/2012  T:  10/18/2012  Job:  [id number]

## 2012-10-21 DIAGNOSIS — R945 Abnormal results of liver function studies: Secondary | ICD-10-CM | POA: Diagnosis not present

## 2012-10-21 DIAGNOSIS — E785 Hyperlipidemia, unspecified: Secondary | ICD-10-CM | POA: Diagnosis not present

## 2012-10-21 DIAGNOSIS — R7309 Other abnormal glucose: Secondary | ICD-10-CM | POA: Diagnosis not present

## 2012-10-21 DIAGNOSIS — R7989 Other specified abnormal findings of blood chemistry: Secondary | ICD-10-CM | POA: Diagnosis not present

## 2012-10-21 DIAGNOSIS — E7800 Pure hypercholesterolemia, unspecified: Secondary | ICD-10-CM | POA: Diagnosis not present

## 2012-10-21 DIAGNOSIS — E039 Hypothyroidism, unspecified: Secondary | ICD-10-CM | POA: Diagnosis not present

## 2012-10-21 LAB — HEMOGLOBIN A1C: A1c: 5.7

## 2012-10-21 LAB — COMPREHENSIVE METABOLIC PANEL
ALT: 15 U/L (ref 7–35)
AST: 11 U/L
Alkaline Phosphatase: 59 U/L
BUN: 18 mg/dL (ref 4–21)
Creat: 0.76
Glucose: 86
Iron: 70
Sodium: 146 mmol/L (ref 137–147)
Total Bilirubin: 0.4 mg/dL

## 2012-10-21 LAB — CBC
HGB: 12.8 g/dL
WBC: 5.1
platelet count: 182

## 2012-10-21 LAB — LIPID PANEL
Cholesterol: 161 mg/dL (ref 0–200)
Direct LDL: 87
HDL: 59 mg/dL (ref 35–70)
Triglycerides: 76

## 2012-10-21 LAB — TSH: TSH: 2.24

## 2012-10-27 DIAGNOSIS — R7309 Other abnormal glucose: Secondary | ICD-10-CM | POA: Diagnosis not present

## 2012-10-27 DIAGNOSIS — E785 Hyperlipidemia, unspecified: Secondary | ICD-10-CM | POA: Diagnosis not present

## 2012-10-27 DIAGNOSIS — I10 Essential (primary) hypertension: Secondary | ICD-10-CM | POA: Diagnosis not present

## 2012-10-27 DIAGNOSIS — E039 Hypothyroidism, unspecified: Secondary | ICD-10-CM | POA: Diagnosis not present

## 2012-11-17 DIAGNOSIS — R0609 Other forms of dyspnea: Secondary | ICD-10-CM | POA: Diagnosis not present

## 2012-11-17 DIAGNOSIS — R0989 Other specified symptoms and signs involving the circulatory and respiratory systems: Secondary | ICD-10-CM | POA: Diagnosis not present

## 2012-11-17 DIAGNOSIS — R5383 Other fatigue: Secondary | ICD-10-CM | POA: Diagnosis not present

## 2012-11-17 DIAGNOSIS — G4730 Sleep apnea, unspecified: Secondary | ICD-10-CM | POA: Diagnosis not present

## 2012-11-17 DIAGNOSIS — R5381 Other malaise: Secondary | ICD-10-CM | POA: Diagnosis not present

## 2012-12-16 DIAGNOSIS — R21 Rash and other nonspecific skin eruption: Secondary | ICD-10-CM | POA: Diagnosis not present

## 2012-12-16 DIAGNOSIS — G2581 Restless legs syndrome: Secondary | ICD-10-CM | POA: Diagnosis not present

## 2012-12-16 DIAGNOSIS — I872 Venous insufficiency (chronic) (peripheral): Secondary | ICD-10-CM | POA: Diagnosis not present

## 2012-12-29 ENCOUNTER — Other Ambulatory Visit: Payer: Self-pay | Admitting: *Deleted

## 2012-12-29 ENCOUNTER — Telehealth: Payer: Self-pay | Admitting: Hematology & Oncology

## 2012-12-29 DIAGNOSIS — C439 Malignant melanoma of skin, unspecified: Secondary | ICD-10-CM

## 2012-12-29 DIAGNOSIS — M79621 Pain in right upper arm: Secondary | ICD-10-CM

## 2012-12-29 NOTE — Telephone Encounter (Signed)
Pt aware of 2-25 appointment

## 2012-12-30 ENCOUNTER — Ambulatory Visit (HOSPITAL_BASED_OUTPATIENT_CLINIC_OR_DEPARTMENT_OTHER): Payer: Medicare Other | Admitting: Medical

## 2012-12-30 ENCOUNTER — Other Ambulatory Visit (HOSPITAL_BASED_OUTPATIENT_CLINIC_OR_DEPARTMENT_OTHER): Payer: Medicare Other | Admitting: Lab

## 2012-12-30 VITALS — BP 108/68 | HR 67 | Temp 97.6°F | Resp 16 | Ht 63.0 in | Wt 213.0 lb

## 2012-12-30 DIAGNOSIS — M7989 Other specified soft tissue disorders: Secondary | ICD-10-CM

## 2012-12-30 DIAGNOSIS — C4360 Malignant melanoma of unspecified upper limb, including shoulder: Secondary | ICD-10-CM

## 2012-12-30 DIAGNOSIS — C439 Malignant melanoma of skin, unspecified: Secondary | ICD-10-CM

## 2012-12-30 DIAGNOSIS — M79621 Pain in right upper arm: Secondary | ICD-10-CM

## 2012-12-30 DIAGNOSIS — M79609 Pain in unspecified limb: Secondary | ICD-10-CM | POA: Diagnosis not present

## 2012-12-30 LAB — CBC WITH DIFFERENTIAL (CANCER CENTER ONLY)
BASO#: 0 10*3/uL (ref 0.0–0.2)
BASO%: 0.2 % (ref 0.0–2.0)
EOS%: 3.4 % (ref 0.0–7.0)
Eosinophils Absolute: 0.2 10*3/uL (ref 0.0–0.5)
HCT: 37.8 % (ref 34.8–46.6)
HGB: 12.4 g/dL (ref 11.6–15.9)
LYMPH#: 1.5 10*3/uL (ref 0.9–3.3)
LYMPH%: 27.4 % (ref 14.0–48.0)
MCH: 30.5 pg (ref 26.0–34.0)
MCHC: 32.8 g/dL (ref 32.0–36.0)
MCV: 93 fL (ref 81–101)
MONO#: 0.5 10*3/uL (ref 0.1–0.9)
MONO%: 8.8 % (ref 0.0–13.0)
NEUT#: 3.3 10*3/uL (ref 1.5–6.5)
NEUT%: 60.2 % (ref 39.6–80.0)
Platelets: 156 10*3/uL (ref 145–400)
RBC: 4.07 10*6/uL (ref 3.70–5.32)
RDW: 12.9 % (ref 11.1–15.7)
WBC: 5.5 10*3/uL (ref 3.9–10.0)

## 2012-12-30 LAB — COMPREHENSIVE METABOLIC PANEL
ALT: 14 U/L (ref 0–35)
AST: 10 U/L (ref 0–37)
Albumin: 3.8 g/dL (ref 3.5–5.2)
Alkaline Phosphatase: 52 U/L (ref 39–117)
BUN: 17 mg/dL (ref 6–23)
CO2: 31 mEq/L (ref 19–32)
Calcium: 9.7 mg/dL (ref 8.4–10.5)
Chloride: 106 mEq/L (ref 96–112)
Creatinine, Ser: 0.75 mg/dL (ref 0.50–1.10)
Glucose, Bld: 96 mg/dL (ref 70–99)
Potassium: 3.6 mEq/L (ref 3.5–5.3)
Sodium: 145 mEq/L (ref 135–145)
Total Bilirubin: 0.5 mg/dL (ref 0.3–1.2)
Total Protein: 6.2 g/dL (ref 6.0–8.3)

## 2012-12-30 NOTE — Progress Notes (Signed)
Diagnosis: Stage IIIc (T3b N3, M0) melanoma of the right shoulder   Current therapy: Observation.  Interim history: [name] presents today as a work in.  Apparently, [name] called and spoke to the, nurse, regarding some swelling, and tenderness under her right axilla.  She states, that for about 2 weeks, now.  She has noticed some swelling, along with some tenderness.  She also reports, that she noticed a rash on the upper right side of her chest.  She did followup with her primary care physician, who prescribed a cream to be placed on the rash.  Unfortunately, this note, prohibited her from going to the, pharmacy, and she started utilizing Neosporin on the rash.  She, reports, that the rash did clear up somewhat.  [name] last PET scan was back in August of 2012.   Her scan did not reveal any recurrence or metastatic disease.  Her last bilateral mammogram was done back in December.  She did have to have a biopsy done secondary to dense breast, however, that was benign. She otherwise reports, that she is eating okay.  She is not having any nausea, vomiting, diarrhea, constipation.  She denies any obvious, bleeding.  She does not report any leg swelling. She denies any headaches or visual changes.  She denies any cough, chest pain, shortness of breath, fevers, chills, or night sweats.    Review of Systems: Constitutional:Negative for malaise/fatigue, fever, chills, weight loss, diaphoresis, activity change, appetite change, and unexpected weight change.  HEENT: Negative for double vision, blurred vision, visual loss, ear pain, tinnitus, congestion, rhinorrhea, epistaxis sore throat or sinus disease, oral pain/lesion, tongue soreness Respiratory: Negative for cough, chest tightness, shortness of breath, wheezing and stridor.  Cardiovascular: Negative for chest pain, palpitations, leg swelling, orthopnea, PND, DOE or claudication Gastrointestinal: Negative for nausea, vomiting, abdominal pain,  diarrhea, constipation, blood in stool, melena, hematochezia, abdominal distention, anal bleeding, rectal pain, anorexia and hematemesis.  Genitourinary: Negative for dysuria, frequency, hematuria,  Musculoskeletal: Negative for myalgias, back pain, joint swelling, arthralgias and gait problem.  Skin: Negative for rash, color change, pallor and wound.  Neurological:. Negative for dizziness/light-headedness, tremors, seizures, syncope, facial asymmetry, speech difficulty, weakness, numbness, headaches and paresthesias.  Hematological: Negative for adenopathy. Does not bruise/bleed easily.  Psychiatric/Behavioral:  Negative for depression, no loss of interest in normal activity or change in sleep pattern.   Physical Exam: [name], 67 year old, well-developed, well-nourished, female, in no obvious distress.   Vitals: Temperature 97.6 degrees, pulse 67, respirations 16, blood pressure 108/68, weight 213 pounds HEENT reveals a normocephalic, atraumatic skull, no scleral icterus, no oral lesions  Neck is supple without any cervical or supraclavicular adenopathy.  Lungs are clear to auscultation bilaterally. There are no wheezes, rales or rhonci Cardiac is regular rate and rhythm with a normal S1 and S2. There are no murmurs, rubs, or bruits.  Abdomen is soft with good bowel sounds, there is no palpable mass. There is no palpable hepatosplenomegaly. There is no palpable fluid wave.  Musculoskeletal no tenderness of the spine, ribs, or hips.  Extremities there are no clubbing, cyanosis, or edema.  Skin no petechia, purpura or ecchymosis Neurologic is nonfocal. Bilateral breast exam: Right breast reveals no masses, edema, or erythema.  She has no right axillary adenopathy, that I can palpate.  She does have some right axillary swelling, with tenderness to palpation.  Left breast reveals no masses, edema, or erythema.  There is no left axillary adenopathy.  Laboratory Data: Clif[place]on Asters  count 5.5,  hemoglobin 12.4, hematocrit 37.8, platelets 156,000  Current Outpatient Prescriptions on File Prior to Visit  Medication Sig Dispense Refill  . albuterol (PROVENTIL HFA;VENTOLIN HFA) 108 (90 BASE) MCG/ACT inhaler Inhale 2 puffs into the lungs every 6 (six) hours as needed.        Marland Kitchen aspirin 162 MG EC tablet Take 162 mg by mouth daily.      . Ergocalciferol (DRISDOL PO) Take 1.25 mg by mouth once a week.       . gabapentin (NEURONTIN) 300 MG capsule Take 300 mg by mouth Nightly.       . loratadine (CLARITIN) 10 MG tablet Take 10 mg by mouth daily as needed.        . montelukast (SINGULAIR) 10 MG tablet Take 10 mg by mouth every evening.       . Multiple Vitamins-Minerals (MULTIVITAMIN PO) Take by mouth every morning.      . olmesartan-hydrochlorothiazide (BENICAR HCT) 20-12.5 MG per tablet Take 1 tablet by mouth daily.       . potassium chloride SA (K-DUR,KLOR-CON) 20 MEQ tablet Take 20 mEq by mouth daily.       . Probiotic Product (PROBIOTIC DAILY PO) Take by mouth every morning.       No current facility-administered medications on file prior to visit.   Assessment/Plan: [name], 67 year old, female, with the following issues:  #1.  Right axillary swelling/tenderness.  I'm not sure what the cause of this is.  She does have a history of melanoma on the right side.  I believe, to be on the safe side we can go ahead and repeat a PET scan to make sure there is no recurrent or metastatic disease.  #2.  History of stage IIIC, melanoma of the right shoulder.  She did undergo a wide local excision.  She had right axillary lymphadenectomy done.  She had 8 lymph nodes that were positive.  Her tumor was BRAF negative.  She received high-dose interferon over 4 months.  She then underwent radiation therapy to the right axilla to help with local control.    #3 followup [name] will follow back up with [place] in 4 months, but before then should there be questions or concerns.  I informed.  Ms.  [name], that after she has the PET scan, and we have the results, I will be in touch with her.

## 2013-01-08 ENCOUNTER — Encounter (HOSPITAL_COMMUNITY): Payer: Self-pay

## 2013-01-08 ENCOUNTER — Encounter (HOSPITAL_COMMUNITY)
Admission: RE | Admit: 2013-01-08 | Discharge: 2013-01-08 | Disposition: A | Discharge status: Home / Self Care | Payer: Medicare Other | Source: Ambulatory Visit | Attending: Medical | Admitting: Medical

## 2013-01-08 DIAGNOSIS — C439 Malignant melanoma of skin, unspecified: Secondary | ICD-10-CM | POA: Insufficient documentation

## 2013-01-08 LAB — GLUCOSE, CAPILLARY: Glucose-Capillary: 80 mg/dL (ref 70–99)

## 2013-01-08 MED ORDER — FLUDEOXYGLUCOSE F - 18 (FDG) INJECTION
16.7000 | Freq: Once | INTRAVENOUS | Status: AC | PRN
  Administered 2013-01-08: 16.7 via INTRAVENOUS

## 2013-01-08 MED ORDER — FLUDEOXYGLUCOSE F - 18 (FDG) INJECTION: 1000.0000 | Freq: Once | INTRAVENOUS | Status: DC | PRN

## 2013-01-19 ENCOUNTER — Encounter: Payer: Self-pay | Admitting: *Deleted

## 2013-01-19 NOTE — Progress Notes (Signed)
Pt called and asked for PET scan results from 01/08/13.  Told her everything was negative.  Voiced understanding.

## 2013-02-03 DIAGNOSIS — D235 Other benign neoplasm of skin of trunk: Secondary | ICD-10-CM | POA: Diagnosis not present

## 2013-02-03 DIAGNOSIS — D485 Neoplasm of uncertain behavior of skin: Secondary | ICD-10-CM | POA: Diagnosis not present

## 2013-02-03 DIAGNOSIS — D1801 Hemangioma of skin and subcutaneous tissue: Secondary | ICD-10-CM | POA: Diagnosis not present

## 2013-02-03 DIAGNOSIS — L538 Other specified erythematous conditions: Secondary | ICD-10-CM | POA: Diagnosis not present

## 2013-02-03 DIAGNOSIS — L821 Other seborrheic keratosis: Secondary | ICD-10-CM | POA: Diagnosis not present

## 2013-02-03 DIAGNOSIS — D2370 Other benign neoplasm of skin of unspecified lower limb, including hip: Secondary | ICD-10-CM | POA: Diagnosis not present

## 2013-02-03 DIAGNOSIS — Z85820 Personal history of malignant melanoma of skin: Secondary | ICD-10-CM | POA: Diagnosis not present

## 2013-02-16 ENCOUNTER — Ambulatory Visit: Payer: Medicare Other | Admitting: Medical

## 2013-02-16 ENCOUNTER — Other Ambulatory Visit: Payer: Medicare Other | Admitting: Lab

## 2013-03-10 ENCOUNTER — Telehealth: Payer: Self-pay | Admitting: Family Medicine

## 2013-03-10 NOTE — Telephone Encounter (Signed)
Pt's PCP has just notified pt he will not be practicing anymore and she is in need of med refills w/in the next couple of weeks.  She is interested in establishing w/you as her PCP and has MCR, primary and [employer] secondary. Your first New MCR Pt apptmt is not until the middle of July.  Can you accommodate her an apptmt prior to July w/in the next couple of weeks? Thank you.

## 2013-03-12 NOTE — Telephone Encounter (Signed)
May place May 13th at 10:30am

## 2013-03-16 NOTE — Telephone Encounter (Signed)
Pt scheduled 03/17/2013

## 2013-03-17 ENCOUNTER — Encounter: Payer: Self-pay | Admitting: Family Medicine

## 2013-03-17 ENCOUNTER — Ambulatory Visit (INDEPENDENT_AMBULATORY_CARE_PROVIDER_SITE_OTHER): Payer: Medicare Other | Admitting: Family Medicine

## 2013-03-17 VITALS — BP 144/86 | HR 76 | Temp 98.3°F | Ht 63.0 in | Wt 211.8 lb

## 2013-03-17 DIAGNOSIS — M1990 Unspecified osteoarthritis, unspecified site: Secondary | ICD-10-CM

## 2013-03-17 DIAGNOSIS — M129 Arthropathy, unspecified: Secondary | ICD-10-CM

## 2013-03-17 DIAGNOSIS — I10 Essential (primary) hypertension: Secondary | ICD-10-CM

## 2013-03-17 DIAGNOSIS — G4733 Obstructive sleep apnea (adult) (pediatric): Secondary | ICD-10-CM

## 2013-03-17 DIAGNOSIS — I872 Venous insufficiency (chronic) (peripheral): Secondary | ICD-10-CM

## 2013-03-17 DIAGNOSIS — C439 Malignant melanoma of skin, unspecified: Secondary | ICD-10-CM

## 2013-03-17 DIAGNOSIS — J45909 Unspecified asthma, uncomplicated: Secondary | ICD-10-CM | POA: Diagnosis not present

## 2013-03-17 DIAGNOSIS — E559 Vitamin D deficiency, unspecified: Secondary | ICD-10-CM

## 2013-03-17 DIAGNOSIS — G2581 Restless legs syndrome: Secondary | ICD-10-CM

## 2013-03-17 MED ORDER — OLMESARTAN MEDOXOMIL-HCTZ 20-12.5 MG PO TABS: 1.0000 | ORAL_TABLET | Freq: Every day | ORAL | Status: DC

## 2013-03-17 NOTE — Patient Instructions (Signed)
I've refilled benicar 20/12.5mg  daily   We may need to switch you over to generic form if insurance doesn't cover this. Good to meet you today, call us with quesitons. Return as needed or in next few months fasting for blood work and afterwards for [employer] visit.

## 2013-03-17 NOTE — Progress Notes (Signed)
Subjective:    Patient ID: [name], female    DOB: September 25, 1946, 67 y.o.   MRN: [id number]  HPI CC: new pt to establish  Prior saw [facility], this practice has closed.  Pt from [facility], [place].  Brings some records with her.  H/o melanoma on R back dx 2011 s/p surgery and interferon - interferon caused severe muscle spasms and rigors.  Has tried flexeril and several other muscle relaxants for this.  Sees Dr. Myna Hidalgo onc at [facility] and [place] [facility] Q6 mo.  2 yr sxs free.  HTN - on benicar HCT for last year.  To run out on Saturday.  prior on diovan HCT for several years.  Would like to continue with benicar as this has controlled meds.  Prior was on a generic which didn't work.  Takes potassium daily.  BP Readings from Last 3 Encounters:  03/17/13 144/86  12/30/12 108/68  10/17/12 105/55   Vit D - on ergocalciferol 1.25mg  weekly by prior PCP.  Has been told has bulging disc in spine and severe arthritis at base of neck.  Has completed PT for this.  H/o OSA on CPAP, RLS - was on gabapentin but didn't help.  recenlty prescribed clonazepam but hadn't tried. Asks if should start med.  H/o adult onset asthma, well controlled. H/o circulation issues - likely CVI.  Lives alone.  Sister and husband live next door Lives in RV. Occupation: retired, prior worked as Emergency planning/management officer at [employer]. Edu: some college    Wt Readings from Last 3 Encounters:  03/17/13 211 lb 12 oz (96.049 kg)  12/30/12 213 lb (96.616 kg)  10/17/12 208 lb (94.348 kg)    Preventative: Last CPE about 1 year Colonoscopy 2008 per pt - no records of this Well woman exam with OBGYN (Dr. Fransico Michael) pap 2013 Mammogram 2013 per pt Tetanus ? Pneumovax ?2007  Medications and allergies reviewed and updated in chart.  Past histories reviewed and updated if relevant as below. Patient Active Problem List   Diagnosis Date Noted  . Sleep apnea   . Melanoma   . History of radiation therapy   . Cancer  06/29/2010   Past Medical History  Diagnosis Date  . Hypertension   . Allergy   . Venous thrombosis 2011    developed after venous ablation  . Tinnitus   . Asthma in adult   . Chronic venous insufficiency   . Melanoma 06/29/10    MALIGNANT MELANOMA R SHOULDER/SUPRASCAPULAR BACK s/p interferon chemo and XRT  . History of radiation therapy 07/19/11-08/25/2011    RIGHT AXILLARY REGION/METASTATIC  . History of kidney stones 1980s  . Neuromuscular disorder     muscle spasms  . Arthritis   . Seasonal allergies   . OSA (obstructive sleep apnea)     on CPAP  . RLS (restless legs syndrome)   . Anesthesia complication     trouble waking up 2/2 CPAP   Past Surgical History  Procedure Laterality Date  . Venous ablation  2011    left leg  . Lumbar laminectomy  2001    l4-l5  . Deep axillary sentinel node biopsy / excision  2012    RIGHT  . Portacath placement      left subclavian  . Breast surgery Right 1990  . Dilation and curettage of uterus  2010  . Port-a-cath removal  12/05/2011    Procedure: REMOVAL PORT-A-CATH;  Surgeon: Emelia Loron, MD;  Location: [facility];  Service: General;  Laterality: N/A;  left port removal  . Breast biopsy Left 2012    BENIGN  . Knee surgery  1985    left  . Melanoma excision  2011    Right shoulder   History  Substance Use Topics  . Smoking status: Former Smoker    Quit date: 11/05/1965  . Smokeless tobacco: Never Used  . Alcohol Use: Yes     Comment: drinks wine occasionally   Family History  Problem Relation Age of Onset  . Cancer Mother 67    uterine  . Cancer Cousin     x2, breast  . CAD Father 62    MI  . Hypertension Father 56  . Diabetes Sister   . Cancer Paternal Grandmother     melanoma, possibly  . Alzheimer's disease Father 51   Allergies  Allergen Reactions  . Sulfa Drugs Cross Reactors Swelling    Sulfa medications-swelling of face   Current Outpatient Prescriptions on File Prior to Visit   Medication Sig Dispense Refill  . albuterol (PROVENTIL HFA;VENTOLIN HFA) 108 (90 BASE) MCG/ACT inhaler Inhale 2 puffs into the lungs every 6 (six) hours as needed.        Marland Kitchen aspirin 162 MG EC tablet Take 162 mg by mouth daily.      . Ergocalciferol (DRISDOL PO) Take 1.25 mg by mouth once a week.       . gabapentin (NEURONTIN) 300 MG capsule Take 300 mg by mouth Nightly.       . loratadine (CLARITIN) 10 MG tablet Take 10 mg by mouth daily as needed.        . montelukast (SINGULAIR) 10 MG tablet Take 10 mg by mouth every evening.       . Multiple Vitamins-Minerals (MULTIVITAMIN PO) Take by mouth every morning.      . olmesartan-hydrochlorothiazide (BENICAR HCT) 20-12.5 MG per tablet Take 1 tablet by mouth daily.       . potassium chloride SA (K-DUR,KLOR-CON) 20 MEQ tablet Take 20 mEq by mouth daily.       . Probiotic Product (PROBIOTIC DAILY PO) Take by mouth every morning.      Marland Kitchen ketoconazole (NIZORAL) 2 % cream Apply 1 application topically as needed.        No current facility-administered medications on file prior to visit.     Review of Systems  Constitutional: Negative for fever, chills, activity change, appetite change, fatigue and unexpected weight change.  HENT: Negative for hearing loss and neck pain.   Eyes: Negative for visual disturbance.  Respiratory: Negative for cough, chest tightness, shortness of breath and wheezing.   Cardiovascular: Positive for leg swelling (intermittent bilateral due to poor circulation). Negative for chest pain and palpitations.  Gastrointestinal: Negative for nausea, vomiting, abdominal pain, diarrhea, constipation, blood in stool and abdominal distention.  Genitourinary: Negative for hematuria and difficulty urinating.  Musculoskeletal: Negative for myalgias and arthralgias.  Skin: Negative for rash.  Neurological: Negative for dizziness, seizures, syncope and headaches.  Hematological: Negative for adenopathy. Does not bruise/bleed easily.   Psychiatric/Behavioral: Negative for dysphoric mood. The patient is not nervous/anxious.        Objective:   Physical Exam  Nursing note and vitals reviewed. Constitutional: She is oriented to person, place, and time. She appears well-developed and well-nourished. No distress.  obese  HENT:  Head: Normocephalic and atraumatic.  Nose: Nose normal.  Mouth/Throat: Oropharynx is clear and moist. No oropharyngeal exudate.  Eyes: Conjunctivae and EOM  are normal. Pupils are equal, round, and reactive to light. No scleral icterus.  Neck: Normal range of motion. Neck supple. Carotid bruit is not present. No thyromegaly present.  Cardiovascular: Normal rate, regular rhythm, normal heart sounds and intact distal pulses.   No murmur heard. Pulses:      Radial pulses are 2+ on the right side, and 2+ on the left side.  Pulmonary/Chest: Effort normal and breath sounds normal. No respiratory distress. She has no wheezes. She has no rales.  Musculoskeletal: Normal range of motion. She exhibits edema (tr pedal edema).  Lymphadenopathy:    She has no cervical adenopathy.  Neurological: She is alert and oriented to person, place, and time.  CN grossly intact, station and gait intact  Skin: Skin is warm and dry. No rash noted.  Scar on right medial shoulder present  Psychiatric: She has a normal mood and affect. Her behavior is normal. Judgment and thought content normal.       Assessment & Plan:

## 2013-03-18 ENCOUNTER — Telehealth: Payer: Self-pay | Admitting: *Deleted

## 2013-03-18 ENCOUNTER — Encounter: Payer: Self-pay | Admitting: Family Medicine

## 2013-03-18 DIAGNOSIS — E559 Vitamin D deficiency, unspecified: Secondary | ICD-10-CM | POA: Insufficient documentation

## 2013-03-18 DIAGNOSIS — G4733 Obstructive sleep apnea (adult) (pediatric): Secondary | ICD-10-CM | POA: Insufficient documentation

## 2013-03-18 DIAGNOSIS — M1990 Unspecified osteoarthritis, unspecified site: Secondary | ICD-10-CM | POA: Insufficient documentation

## 2013-03-18 DIAGNOSIS — I10 Essential (primary) hypertension: Secondary | ICD-10-CM | POA: Insufficient documentation

## 2013-03-18 DIAGNOSIS — J45909 Unspecified asthma, uncomplicated: Secondary | ICD-10-CM | POA: Insufficient documentation

## 2013-03-18 DIAGNOSIS — I872 Venous insufficiency (chronic) (peripheral): Secondary | ICD-10-CM | POA: Insufficient documentation

## 2013-03-18 DIAGNOSIS — G2581 Restless legs syndrome: Secondary | ICD-10-CM | POA: Insufficient documentation

## 2013-03-18 HISTORY — DX: Vitamin D deficiency, unspecified: E55.9

## 2013-03-18 NOTE — Assessment & Plan Note (Signed)
H/o chronic neck pain.

## 2013-03-18 NOTE — Telephone Encounter (Signed)
Prior auth for Benicar in Dr Timoteo Expose in box.

## 2013-03-18 NOTE — Telephone Encounter (Signed)
Benicar requires PA. Awaiting PA form from insurance.

## 2013-03-18 NOTE — Assessment & Plan Note (Signed)
Compliant with CPAP as able.

## 2013-03-18 NOTE — Assessment & Plan Note (Signed)
On ergocalciferol - consider change to cholecalciferol 50,000 U weekly given better efficacy.  Check vit D levels next blood work.

## 2013-03-18 NOTE — Assessment & Plan Note (Signed)
Discussed trial of klonopin.  Pt will start today.  Gabapentin was not effective.

## 2013-03-18 NOTE — Assessment & Plan Note (Signed)
Sees Dr. Myna Hidalgo Q6 mo.

## 2013-03-18 NOTE — Assessment & Plan Note (Signed)
I've refilled benicar HCT.  Pt unsure what generic she's tried in past.  Discussed may need to do trial of generic for insurance coverage of benicar.

## 2013-03-19 NOTE — Telephone Encounter (Signed)
Pt has enough Benicar to last until 03/22/13 and pt wants to know if any samples are available while waiting on PA.

## 2013-03-19 NOTE — Telephone Encounter (Signed)
No samples available. Message left advising patient.

## 2013-03-19 NOTE — Telephone Encounter (Signed)
Filled out and placed in my out box. 

## 2013-03-20 NOTE — Telephone Encounter (Signed)
Benicar PA was approved; Pam at [facility] said rx went thru; left v/m for pt can pick up med at pharmacy. Letter in Dr Timoteo Expose in box for signature and scanning.

## 2013-04-03 ENCOUNTER — Ambulatory Visit (INDEPENDENT_AMBULATORY_CARE_PROVIDER_SITE_OTHER): Payer: Medicare Other | Admitting: Family Medicine

## 2013-04-03 ENCOUNTER — Encounter: Payer: Self-pay | Admitting: Family Medicine

## 2013-04-03 ENCOUNTER — Telehealth: Payer: Self-pay | Admitting: Family Medicine

## 2013-04-03 VITALS — BP 110/78 | HR 82 | Temp 98.1°F | Wt 207.0 lb

## 2013-04-03 DIAGNOSIS — J4521 Mild intermittent asthma with (acute) exacerbation: Secondary | ICD-10-CM

## 2013-04-03 DIAGNOSIS — J309 Allergic rhinitis, unspecified: Secondary | ICD-10-CM | POA: Diagnosis not present

## 2013-04-03 DIAGNOSIS — J45901 Unspecified asthma with (acute) exacerbation: Secondary | ICD-10-CM | POA: Diagnosis not present

## 2013-04-03 MED ORDER — BUDESONIDE-FORMOTEROL FUMARATE 160-4.5 MCG/ACT IN AERO: 2.0000 | INHALATION_SPRAY | Freq: Two times a day (BID) | RESPIRATORY_TRACT | Status: DC

## 2013-04-03 MED ORDER — AZITHROMYCIN 250 MG PO TABS: ORAL_TABLET | ORAL | Status: DC

## 2013-04-03 NOTE — Telephone Encounter (Signed)
Patient Information:  Caller Name: [place]  Phone: [phone]  Patient: [name], [name]  Gender: Female  DOB: [birth year]  Age: 67 Years  PCP: Eustaquio Boyden [facility])  Office Follow Up:  Does the office need to follow up with this patient?: No  Instructions For The Office: N/A   Symptoms  Reason For Call & Symptoms: pt states since she moved to [place] she has suffered from seasonal allergies.  Pt reports for the past week it has been getting worse (pt started having coughing).  Pt reports she is having to use rescue inhalers  Reviewed Health History In EMR: Yes  Reviewed Medications In EMR: Yes  Reviewed Allergies In EMR: Yes  Reviewed Surgeries / Procedures: Yes  Date of Onset of Symptoms: 03/31/2013  Treatments Tried: Mucinex, Claritin  Treatments Tried Worked: No  Guideline(s) Used:  Hay Fever - Nasal Allergies  Asthma Attack  Disposition Per Guideline:   See Today in Office  Reason For Disposition Reached:   Coughing continuously (nonstop) that keeps from working or sleeping, and not improved after inhaler or nebulizer  Advice Given:  N/A  Patient Will Follow Care Advice:  YES  Appointment Scheduled:  04/03/2013 14:00:00 Appointment Scheduled Provider:  Ruthe Mannan (Family Practice)

## 2013-04-03 NOTE — Patient Instructions (Addendum)
It was very nice to meet you, [name]. Please take Symbicort as directed for 1-2 weeks. Take Zpack as directed. Continue Mucinex, Singulair and Claritin. Please call us next week with an update.

## 2013-04-03 NOTE — Progress Notes (Signed)
Subjective:    Patient ID: [name], female    DOB: Sep 20, 1946, 67 y.o.   MRN: [id number]  HPI 67 yo pleasant female pt of Dr Reece Agar who established care with him earlier this month, here for worsening allergies.  Recently moved here from [facility].  Since moving here, allergic rhinitis symptoms have worsened.  Takes Singular and Claritin daily.  Also taking Mucinex max twice daily.  No inhaled or intranasal steroids.  Also has adult asthma and has been having to use her rescue inhaler more frequently over past several days due to dry, wheezy cough. No fevers. No CP, no SOB.      Patient Active Problem List   Diagnosis Date Noted  . Allergic rhinitis 04/03/2013  . Unspecified vitamin D deficiency 03/18/2013  . Hypertension   . Asthma in adult   . Chronic venous insufficiency   . Arthritis   . OSA (obstructive sleep apnea)   . RLS (restless legs syndrome)   . Melanoma    Past Medical History  Diagnosis Date  . Hypertension   . Venous thrombosis 2011    developed after venous ablation  . Tinnitus   . Asthma in adult   . Chronic venous insufficiency   . Melanoma 06/29/10    MALIGNANT MELANOMA R SHOULDER/SUPRASCAPULAR BACK s/p interferon chemo and XRT  . History of radiation therapy 07/19/11-08/25/2011    RIGHT AXILLARY REGION/METASTATIC  . History of kidney stones 1980s  . Neuromuscular disorder     muscle spasms  . Arthritis   . Seasonal allergies   . OSA (obstructive sleep apnea)     on CPAP  . RLS (restless legs syndrome)   . Anesthesia complication     trouble waking up 2/2 CPAP  . Unspecified vitamin D deficiency 03/18/2013  . Hearing loss sensory, bilateral 2013    mod-severe high freq sensorineural ([facility])   Past Surgical History  Procedure Laterality Date  . Venous ablation  2011    left leg  . Lumbar laminectomy  2001    l4-l5  . Deep axillary sentinel node biopsy / excision  2012    RIGHT  . Portacath placement      left subclavian   . Breast surgery Right 1990  . Dilation and curettage of uterus  2010  . Port-a-cath removal  12/05/2011    Procedure: REMOVAL PORT-A-CATH;  Surgeon: Emelia Loron, MD;  Location: [facility];  Service: General;  Laterality: N/A;  left port removal  . Breast biopsy Left 2012    BENIGN  . Knee surgery  1985    left  . Melanoma excision  2011    Right shoulder   History  Substance Use Topics  . Smoking status: Former Smoker    Quit date: 11/05/1965  . Smokeless tobacco: Never Used  . Alcohol Use: Yes     Comment: drinks wine occasionally   Family History  Problem Relation Age of Onset  . Cancer Mother 12    uterine  . Cancer Cousin     x2, breast  . CAD Father 53    MI  . Hypertension Father 30  . Diabetes Sister   . Cancer Paternal Grandmother     melanoma, possibly  . Alzheimer's disease Father 34   Allergies  Allergen Reactions  . Sulfa Drugs Cross Reactors Swelling    Sulfa medications-swelling of face   Current Outpatient Prescriptions on File Prior to Visit  Medication Sig Dispense Refill  .  albuterol (PROVENTIL HFA;VENTOLIN HFA) 108 (90 BASE) MCG/ACT inhaler Inhale 2 puffs into the lungs every 6 (six) hours as needed.        Marland Kitchen aspirin 162 MG EC tablet Take 162 mg by mouth daily.      . Ergocalciferol (DRISDOL PO) Take 1.25 mg by mouth once a week.       . gabapentin (NEURONTIN) 300 MG capsule Take 300 mg by mouth Nightly.       Marland Kitchen ketoconazole (NIZORAL) 2 % cream Apply 1 application topically as needed.       . loratadine (CLARITIN) 10 MG tablet Take 10 mg by mouth daily as needed.        . montelukast (SINGULAIR) 10 MG tablet Take 10 mg by mouth every evening.       . Multiple Vitamins-Minerals (MULTIVITAMIN PO) Take by mouth every morning.      . olmesartan-hydrochlorothiazide (BENICAR HCT) 20-12.5 MG per tablet Take 1 tablet by mouth daily.  30 tablet  11  . potassium chloride SA (K-DUR,KLOR-CON) 20 MEQ tablet Take 20 mEq by mouth daily.        . Probiotic Product (PROBIOTIC DAILY PO) Take by mouth every morning.       No current facility-administered medications on file prior to visit.     Review of Systems  See HPI      Objective:   Physical Exam  BP 110/78  Pulse 82  Temp(Src) 98.1 F (36.7 C)  Wt 207 lb (93.895 kg)  BMI 36.68 kg/m2  SpO2 98%  Constitutional: She is oriented to person, place, and time. She appears well-developed and well-nourished. No distress.  obese  HENT:  Head: Normocephalic and atraumatic.  Nose: Nose normal.  Mouth/Throat: Oropharynx is clear and moist. No oropharyngeal exudate.  Eyes: Conjunctivae and EOM are normal. Pupils are equal, round, and reactive to light. No scleral icterus.  Neck: Normal range of motion. Neck supple. Carotid bruit is not present. No thyromegaly present.  Cardiovascular: Normal rate, regular rhythm, normal heart sounds and intact distal pulses.   No murmur heard. Pulmonary/Chest: Effort normal and breath sounds normal. No respiratory distress. She has no wheezes. She has no rales.  Psychiatric: She has a normal mood and affect. Her behavior is normal. Judgment and thought content normal.     Assessment & Plan:  1. Asthma in adult, mild intermittent, with acute exacerbation Deteriorated- lungs actually quite clear today but just used her rescue inhaler. Given that symptoms progressing quite quickly, will add Symbicort x 1-2 weeks, Zpack as directed. Call or return to clinic prn if these symptoms worsen or fail to improve as anticipated. See AVS

## 2013-04-20 ENCOUNTER — Telehealth: Payer: Self-pay

## 2013-04-20 MED ORDER — GUAIFENESIN-CODEINE 100-10 MG/5ML PO SYRP: 5.0000 mL | ORAL_SOLUTION | Freq: Two times a day (BID) | ORAL | Status: DC | PRN

## 2013-04-20 NOTE — Telephone Encounter (Signed)
Message left for patient to return my call.  

## 2013-04-20 NOTE — Telephone Encounter (Signed)
Reviewed Dr. Roberts Gaudy note - she could try prescription strength cough syrup for cough (cheratussin), but I would give abx some more time to improve as zpack stays in system for several weeks.  If not better, will recommend she return for office visit. May phone in cheartussin if if she'd like to try for cough.

## 2013-04-20 NOTE — Telephone Encounter (Signed)
Patient notified and she will give zpak more time and try the cough med. Rx called in as directed.

## 2013-04-20 NOTE — Telephone Encounter (Signed)
Pt seen 04/03/13; still taking Mucinex,Singulair, Claritin and Sumbicort; pt was feeling better until end of last week and ears feel closed and then will pop and then close off again;dry cough with post nasal drip and tightness on rt side of neck like muscle spasm. No fever. Pt said Flexeril helps with neck spasm but tightness does not go away. Pt finished the Z pak and wants to know if med can be called in to [facility] or will pt need appt.Please advise.

## 2013-04-22 ENCOUNTER — Encounter: Payer: Self-pay | Admitting: Family Medicine

## 2013-04-29 ENCOUNTER — Ambulatory Visit (HOSPITAL_BASED_OUTPATIENT_CLINIC_OR_DEPARTMENT_OTHER): Payer: Medicare Other | Admitting: Hematology & Oncology

## 2013-04-29 ENCOUNTER — Other Ambulatory Visit (HOSPITAL_BASED_OUTPATIENT_CLINIC_OR_DEPARTMENT_OTHER): Payer: Medicare Other | Admitting: Lab

## 2013-04-29 VITALS — BP 113/74 | HR 71 | Temp 98.0°F | Resp 16 | Ht 63.0 in | Wt 210.0 lb

## 2013-04-29 DIAGNOSIS — C439 Malignant melanoma of skin, unspecified: Secondary | ICD-10-CM | POA: Diagnosis not present

## 2013-04-29 LAB — COMPREHENSIVE METABOLIC PANEL
ALT: 16 U/L (ref 0–35)
AST: 12 U/L (ref 0–37)
Albumin: 3.7 g/dL (ref 3.5–5.2)
Alkaline Phosphatase: 55 U/L (ref 39–117)
BUN: 16 mg/dL (ref 6–23)
CO2: 29 mEq/L (ref 19–32)
Calcium: 9.4 mg/dL (ref 8.4–10.5)
Chloride: 105 mEq/L (ref 96–112)
Creatinine, Ser: 0.73 mg/dL (ref 0.50–1.10)
Glucose, Bld: 92 mg/dL (ref 70–99)
Potassium: 3.8 mEq/L (ref 3.5–5.3)
Sodium: 143 mEq/L (ref 135–145)
Total Bilirubin: 0.4 mg/dL (ref 0.3–1.2)
Total Protein: 6.7 g/dL (ref 6.0–8.3)

## 2013-04-29 LAB — CBC WITH DIFFERENTIAL (CANCER CENTER ONLY)
BASO#: 0 10*3/uL (ref 0.0–0.2)
BASO%: 0.2 % (ref 0.0–2.0)
EOS%: 5.1 % (ref 0.0–7.0)
Eosinophils Absolute: 0.3 10*3/uL (ref 0.0–0.5)
HCT: 38.7 % (ref 34.8–46.6)
HGB: 12.7 g/dL (ref 11.6–15.9)
LYMPH#: 1.4 10*3/uL (ref 0.9–3.3)
LYMPH%: 27.5 % (ref 14.0–48.0)
MCH: 30.8 pg (ref 26.0–34.0)
MCHC: 32.8 g/dL (ref 32.0–36.0)
MCV: 94 fL (ref 81–101)
MONO#: 0.3 10*3/uL (ref 0.1–0.9)
MONO%: 6.6 % (ref 0.0–13.0)
NEUT#: 3.1 10*3/uL (ref 1.5–6.5)
NEUT%: 60.6 % (ref 39.6–80.0)
Platelets: 156 10*3/uL (ref 145–400)
RBC: 4.12 10*6/uL (ref 3.70–5.32)
RDW: 12.9 % (ref 11.1–15.7)
WBC: 5.1 10*3/uL (ref 3.9–10.0)

## 2013-04-29 NOTE — Progress Notes (Signed)
This office note has been dictated.

## 2013-04-30 NOTE — Progress Notes (Signed)
CC:   Amber Boyden, MD [name]. [name], M.D.  DIAGNOSIS:  Stage IIIC (T3b N3 N0) melanoma of the right shoulder.  CURRENT THERAPY:  Observation.  INTERVAL HISTORY:  [name] comes in for followup.  We see her every 4 months.  She is doing okay.  When she was here last in February, she was having some issues over on the right axilla and breast.  French Ana saw her and did a good job with the evaluation and management.  There was no obvious recurrent melanoma.  Everything looked okay.  The pain and swelling went away.  I told [name] that she probably was going to have some issues with the right breast and right axilla because her lymph nodes were removed.  Otherwise, she is doing okay.  There is no nausea or vomiting.  She is having some issues with her neck.  She sees Dr. Jerl [name] of Orthopedic surgery, so we will give him a call to see if he can see her again.  She was told that she had a "compromised immune system.  She does not have a compromised immune system.  She is having problems with allergies.  She got through the spring with 3 episodes of allergies. Again, I think this is a sign of her immune system being overactive.  PHYSICAL EXAMINATION:  General:  This is a well-developed, well- nourished white female in no obvious distress.  Vital signs: Temperature of 98, pulse 71, respiratory rate 16, blood pressure 113/74. Weight is 210 pounds.  Head and neck:  Normocephalic, atraumatic skull. There are no ocular or oral lesions.  There are no palpable cervical or supraclavicular lymph nodes.  Lungs:  Clear bilaterally.  Cardiac: Regular rate and rhythm with a normal S1, S2.  There are no murmurs, rubs or bruits.  Abdomen:  Soft with good bowel sounds.  There is no palpable abdominal mass.  There is no palpable hepatosplenomegaly. Breasts:  Shows left breast with no masses, edema, or erythema.  There is no left axillary adenopathy.  The right breast is slightly  "full". There is some slight firmness to the right breast.  No distinct mass is noted in the right breast.  There is no right axillary adenopathy or swelling.  Abdomen:  Soft.  She has good bowel sounds.  There is no fluid wave.  No palpable hepatosplenomegaly is noted.  Extremities shows mild lymphedema of the right arm.  She has good range of motion of her joints.  She has some slight trace edema in her legs.  Back exam does show some of the stiffness of the right neck.  Neurologic:  No focal neurological deficits.  LABORATORY STUDIES:  White cell count 5.1, hemoglobin 12.7, hematocrit 38.7, platelet count 156,000.  IMPRESSION:  [name] is a very nice 67 year old white female with stage IIIC melanoma of the right shoulder.  She underwent high-dose interferon adjuvant therapy.  She completed this back in December 2012.  She did have 8 lymph nodes that were positive.  She did receive radiation therapy to the right axilla.  We will plan to get her back in 4 more months.  I think once we get through this year, then we can go every 6 months.    ______________________________ [name], M.D. PRE/MEDQ  D:  04/29/2013  T:  04/30/2013  Job:  [id number]

## 2013-05-03 ENCOUNTER — Encounter: Payer: Self-pay | Admitting: Family Medicine

## 2013-05-04 ENCOUNTER — Encounter: Payer: Self-pay | Admitting: Family Medicine

## 2013-05-04 DIAGNOSIS — M5030 Other cervical disc degeneration, unspecified cervical region: Secondary | ICD-10-CM | POA: Diagnosis not present

## 2013-05-10 ENCOUNTER — Other Ambulatory Visit: Payer: Self-pay | Admitting: Family Medicine

## 2013-05-10 DIAGNOSIS — E559 Vitamin D deficiency, unspecified: Secondary | ICD-10-CM

## 2013-05-10 DIAGNOSIS — I10 Essential (primary) hypertension: Secondary | ICD-10-CM

## 2013-05-11 ENCOUNTER — Other Ambulatory Visit (INDEPENDENT_AMBULATORY_CARE_PROVIDER_SITE_OTHER): Payer: Medicare Other

## 2013-05-11 ENCOUNTER — Ambulatory Visit: Payer: Medicare Other | Attending: Orthopaedic Surgery | Admitting: Rehabilitation

## 2013-05-11 DIAGNOSIS — M542 Cervicalgia: Secondary | ICD-10-CM | POA: Insufficient documentation

## 2013-05-11 DIAGNOSIS — IMO0001 Reserved for inherently not codable concepts without codable children: Secondary | ICD-10-CM | POA: Insufficient documentation

## 2013-05-11 DIAGNOSIS — I10 Essential (primary) hypertension: Secondary | ICD-10-CM

## 2013-05-11 DIAGNOSIS — Z85820 Personal history of malignant melanoma of skin: Secondary | ICD-10-CM | POA: Insufficient documentation

## 2013-05-11 DIAGNOSIS — E559 Vitamin D deficiency, unspecified: Secondary | ICD-10-CM | POA: Diagnosis not present

## 2013-05-11 LAB — LIPID PANEL
Cholesterol: 189 mg/dL (ref 0–200)
HDL: 61.3 mg/dL (ref >39.00)
LDL Cholesterol: 113 mg/dL — ABNORMAL HIGH (ref 0–99)
Total CHOL/HDL Ratio: 3
Triglycerides: 73 mg/dL (ref 0.0–149.0)
VLDL: 14.6 mg/dL (ref 0.0–40.0)

## 2013-05-12 ENCOUNTER — Other Ambulatory Visit: Payer: Self-pay | Admitting: Dermatology

## 2013-05-12 DIAGNOSIS — D485 Neoplasm of uncertain behavior of skin: Secondary | ICD-10-CM | POA: Diagnosis not present

## 2013-05-12 DIAGNOSIS — C44611 Basal cell carcinoma of skin of unspecified upper limb, including shoulder: Secondary | ICD-10-CM | POA: Diagnosis not present

## 2013-05-12 DIAGNOSIS — Z85820 Personal history of malignant melanoma of skin: Secondary | ICD-10-CM | POA: Diagnosis not present

## 2013-05-12 DIAGNOSIS — D1801 Hemangioma of skin and subcutaneous tissue: Secondary | ICD-10-CM | POA: Diagnosis not present

## 2013-05-12 LAB — VITAMIN D 25 HYDROXY (VIT D DEFICIENCY, FRACTURES): Vit D, 25-Hydroxy: 51 ng/mL (ref 30–89)

## 2013-05-12 LAB — TSH: TSH: 1.66 u[IU]/mL (ref 0.35–5.50)

## 2013-05-13 ENCOUNTER — Ambulatory Visit: Payer: Medicare Other | Admitting: Rehabilitation

## 2013-05-14 ENCOUNTER — Ambulatory Visit: Payer: Medicare Other | Admitting: Rehabilitation

## 2013-05-15 ENCOUNTER — Ambulatory Visit (INDEPENDENT_AMBULATORY_CARE_PROVIDER_SITE_OTHER): Payer: Medicare Other | Admitting: Family Medicine

## 2013-05-15 ENCOUNTER — Encounter: Payer: Self-pay | Admitting: Family Medicine

## 2013-05-15 VITALS — BP 128/82 | HR 74 | Temp 98.1°F | Ht 62.5 in | Wt 208.0 lb

## 2013-05-15 DIAGNOSIS — Z23 Encounter for immunization: Secondary | ICD-10-CM | POA: Diagnosis not present

## 2013-05-15 DIAGNOSIS — Z0000 Encounter for general adult medical examination without abnormal findings: Secondary | ICD-10-CM | POA: Diagnosis not present

## 2013-05-15 DIAGNOSIS — G2581 Restless legs syndrome: Secondary | ICD-10-CM

## 2013-05-15 DIAGNOSIS — I10 Essential (primary) hypertension: Secondary | ICD-10-CM

## 2013-05-15 DIAGNOSIS — C439 Malignant melanoma of skin, unspecified: Secondary | ICD-10-CM

## 2013-05-15 DIAGNOSIS — E559 Vitamin D deficiency, unspecified: Secondary | ICD-10-CM

## 2013-05-15 MED ORDER — POTASSIUM CHLORIDE CRYS ER 10 MEQ PO TBCR: 20.0000 meq | EXTENDED_RELEASE_TABLET | Freq: Every day | ORAL | Status: DC

## 2013-05-15 NOTE — Patient Instructions (Addendum)
Schedule mammogram in the fall - let us know if you would like [place] to schedule this for you. Advanced directive packet provided today. Pneumovax today. Good to see you today, call us with questions. Return in 1 month to recheck potassium level - I've sent lower dose of potassium pill.

## 2013-05-15 NOTE — Assessment & Plan Note (Signed)
Reviewed #s.  Consider switch to cholecalciferol.

## 2013-05-15 NOTE — Addendum Note (Signed)
Addended by: Annamarie Major on: 05/15/2013 12:08 PM   Modules accepted: Orders

## 2013-05-15 NOTE — Progress Notes (Signed)
Subjective:    Patient ID: RONDIA HIGGINBOTHAM, female    DOB: 05/30/1946, 67 y.o.   MRN: [id number]  HPI CC: medicare wellness visit  Moved to area 2008.  Received medicare 08/2011.   Recently had biopsy by dermatologist on right forearm - ?BCC.  Hearing and vision screens today.  H/o tinnitus. Denies falls, depression,anhedonia.  Lives alone. Sister and husband live next door  Lives in RV.  Occupation: retired, prior worked as Emergency planning/management officer at [employer]  Edu: some college Diet: poor - good water, rare fruits/vegetables  Preventative:  Colonoscopy 2008 per pt - no records of this.  Told good for 10 years Well woman exam with OBGYN (Dr. Fransico Michael) pap 2013  Mammogram 2013 per pt. Due for repeat. Tetanus unsure Pneumovax ?2007 - pneumovax today. Flu shot - yearly zostavax - has had shingles in the past but never shot.  Interested in shot.  Not on immunosuppressive meds.  Will check with insurance and let us know next month when returns for potassium lab visit. Advanced directives: doesn't want prolonged life support.  requests information today.  Medications and allergies reviewed and updated in chart.  Past histories reviewed and updated if relevant as below. Patient Active Problem List   Diagnosis Date Noted  . Allergic rhinitis 04/03/2013  . Unspecified vitamin D deficiency 03/18/2013  . Hypertension   . Asthma in adult   . Chronic venous insufficiency   . Arthritis   . OSA (obstructive sleep apnea)   . RLS (restless legs syndrome)   . Melanoma    Past Medical History  Diagnosis Date  . Hypertension   . DVT (deep venous thrombosis) 2011    small, developed after venous ablation  . Tinnitus   . Asthma in adult   . Chronic venous insufficiency   . Melanoma 06/29/10    MALIGNANT MELANOMA R SHOULDER/SUPRASCAPULAR BACK s/p interferon chemo and XRT  . History of radiation therapy 07/19/11-08/25/2011    RIGHT AXILLARY REGION/METASTATIC  . History of kidney stones 1980s   . Neuromuscular disorder     muscle spasms  . Arthritis   . Seasonal allergies   . OSA (obstructive sleep apnea)     on CPAP  . RLS (restless legs syndrome)   . Anesthesia complication     trouble waking up 2/2 CPAP  . Unspecified vitamin D deficiency 03/18/2013  . Hearing loss sensory, bilateral 2013    mod-severe high freq sensorineural ([facility])   Past Surgical History  Procedure Laterality Date  . Venous ablation  2011    left leg  . Lumbar laminectomy  2001    L4/5  . Deep axillary sentinel node biopsy / excision  2012    RIGHT  . Portacath placement      left subclavian  . Breast surgery Right 1990  . Dilation and curettage of uterus  2010  . Port-a-cath removal  12/05/2011    Procedure: REMOVAL PORT-A-CATH;  Surgeon: Emelia Loron, MD;  Location: [facility];  Service: General;  Laterality: N/A;  left port removal  . Breast biopsy Left 2012    BENIGN  . Knee surgery  1985    left  . Melanoma excision  2011    Right shoulder, with sentinel lymph node biopsy  . Dexa  12/2009    WNL  . Cardiovascular stress test  2010    normal stress test, EF 66%   History  Substance Use Topics  . Smoking status: Former Smoker  Quit date: 11/05/1965  . Smokeless tobacco: Never Used  . Alcohol Use: Yes     Comment: drinks wine occasionally   Family History  Problem Relation Age of Onset  . Cancer Mother 74    uterine  . Cancer Cousin     x2, breast  . CAD Father 69    MI  . Hypertension Father 74  . Diabetes Sister   . Cancer Paternal Grandmother     melanoma, possibly  . Alzheimer's disease Father 33   Allergies  Allergen Reactions  . Sulfa Drugs Cross Reactors Swelling    Sulfa medications-swelling of face   Current Outpatient Prescriptions on File Prior to Visit  Medication Sig Dispense Refill  . albuterol (PROVENTIL HFA;VENTOLIN HFA) 108 (90 BASE) MCG/ACT inhaler Inhale 2 puffs into the lungs every 6 (six) hours as needed.         Marland Kitchen aspirin 162 MG EC tablet Take 162 mg by mouth daily.      . cyclobenzaprine (FLEXERIL) 10 MG tablet 5 mg at bedtime.       . Ergocalciferol (DRISDOL PO) Take 1.25 mg by mouth once a week.       . loratadine (CLARITIN) 10 MG tablet Take 10 mg by mouth daily as needed.        . montelukast (SINGULAIR) 10 MG tablet Take 10 mg by mouth every evening.       . Multiple Vitamins-Minerals (MULTIVITAMIN PO) Take by mouth every morning.      . olmesartan-hydrochlorothiazide (BENICAR HCT) 20-12.5 MG per tablet Take 1 tablet by mouth daily.  30 tablet  11  . potassium chloride SA (K-DUR,KLOR-CON) 20 MEQ tablet Take 20 mEq by mouth daily.       . Probiotic Product (PROBIOTIC DAILY PO) Take by mouth every morning.       No current facility-administered medications on file prior to visit.     Review of Systems  Constitutional: Negative for fever, chills, activity change, appetite change, fatigue and unexpected weight change.  HENT: Negative for hearing loss and neck pain.   Eyes: Negative for visual disturbance.  Respiratory: Negative for cough, chest tightness, shortness of breath and wheezing.   Cardiovascular: Positive for leg swelling (intermittent bilateral due to poor circulation). Negative for chest pain and palpitations.  Gastrointestinal: Negative for nausea, vomiting, abdominal pain, diarrhea, constipation, blood in stool and abdominal distention.  Genitourinary: Negative for hematuria and difficulty urinating.  Musculoskeletal: Negative for myalgias and arthralgias.  Skin: Negative for rash.  Neurological: Negative for dizziness, seizures, syncope and headaches.  Hematological: Negative for adenopathy. Does not bruise/bleed easily.  Psychiatric/Behavioral: Negative for dysphoric mood. The patient is not nervous/anxious.        Objective:   Physical Exam  Nursing note and vitals reviewed. Constitutional: She is oriented to person, place, and time. She appears well-developed and  well-nourished. No distress.  HENT:  Head: Normocephalic and atraumatic.  Right Ear: External ear normal.  Left Ear: External ear normal.  Nose: Nose normal.  Mouth/Throat: Oropharynx is clear and moist. No oropharyngeal exudate.  Eyes: Conjunctivae and EOM are normal. Pupils are equal, round, and reactive to light. No scleral icterus.  Neck: Normal range of motion. Neck supple. Carotid bruit is not present. No thyromegaly present.  Cardiovascular: Normal rate, regular rhythm, normal heart sounds and intact distal pulses.   No murmur heard. Pulses:      Radial pulses are 2+ on the right side, and 2+ on the left side.  Pulmonary/Chest: Effort normal and breath sounds normal. No respiratory distress. She has no wheezes. She has no rales.  Abdominal: Soft. Bowel sounds are normal. She exhibits no distension and no mass. There is no tenderness. There is no rebound and no guarding.  Musculoskeletal: Normal range of motion. She exhibits no edema.  Lymphadenopathy:    She has no cervical adenopathy.  Neurological: She is alert and oriented to person, place, and time.  CN grossly intact, station and gait intact  Skin: Skin is warm and dry. No rash noted.  Psychiatric: She has a normal mood and affect. Her behavior is normal. Judgment and thought content normal.       Assessment & Plan:

## 2013-05-15 NOTE — Assessment & Plan Note (Signed)
Never started klonopin. Gabapentin not effective. Feels flexeril at night is helping control sxs.

## 2013-05-15 NOTE — Assessment & Plan Note (Signed)
I have personally reviewed the Medicare Annual Wellness questionnaire and have noted 1. The patient's medical and social history 2. Their use of alcohol, tobacco or illicit drugs 3. Their current medications and supplements 4. The patient's functional ability including ADL's, fall risks, home safety risks and hearing or visual impairment. 5. Diet and physical activity 6. Evidence for depression or mood disorders The patients weight, height, BMI have been recorded in the chart.  Hearing and vision has been addressed. I have made referrals, counseling and provided education to the patient based review of the above and I have provided the pt with a written personalized care plan for preventive services. See scanned questionairre. Advanced directives discussed: packet provided.  Reviewed preventative protocols and updated unless pt declined. Pt will look into shingles shot, and if decides to receive may receive next month at lab visit. Pneumovax today - completes series. Unsure about tetanus. Pt will schedule mammogram in fall.   Well woman with OBGYN.

## 2013-05-15 NOTE — Assessment & Plan Note (Signed)
Regularly followed by oncologist and dermatologist

## 2013-05-15 NOTE — Assessment & Plan Note (Signed)
Chronic, stable. On benicar HCT and Kdur daily.  Doesn't like large K horse pill - will do trial of Kdur for next month and return to recheck potassium level.

## 2013-05-18 ENCOUNTER — Ambulatory Visit: Payer: Medicare Other | Admitting: Rehabilitation

## 2013-05-19 ENCOUNTER — Ambulatory Visit: Payer: Medicare Other | Admitting: Rehabilitation

## 2013-05-22 ENCOUNTER — Ambulatory Visit: Payer: Medicare Other | Admitting: Rehabilitation

## 2013-05-25 ENCOUNTER — Ambulatory Visit: Payer: Medicare Other | Admitting: Rehabilitation

## 2013-05-27 ENCOUNTER — Ambulatory Visit: Payer: Medicare Other | Admitting: Rehabilitation

## 2013-05-29 ENCOUNTER — Ambulatory Visit: Payer: Medicare Other | Admitting: Rehabilitation

## 2013-06-01 ENCOUNTER — Ambulatory Visit: Payer: Medicare Other | Admitting: Rehabilitation

## 2013-06-03 ENCOUNTER — Ambulatory Visit: Payer: Medicare Other | Admitting: Rehabilitation

## 2013-06-05 ENCOUNTER — Ambulatory Visit: Payer: Medicare Other | Attending: Orthopaedic Surgery | Admitting: Rehabilitation

## 2013-06-05 DIAGNOSIS — IMO0001 Reserved for inherently not codable concepts without codable children: Secondary | ICD-10-CM | POA: Insufficient documentation

## 2013-06-05 DIAGNOSIS — Z85820 Personal history of malignant melanoma of skin: Secondary | ICD-10-CM | POA: Insufficient documentation

## 2013-06-05 DIAGNOSIS — M542 Cervicalgia: Secondary | ICD-10-CM | POA: Insufficient documentation

## 2013-06-08 ENCOUNTER — Ambulatory Visit: Payer: Medicare Other | Admitting: Rehabilitation

## 2013-06-08 DIAGNOSIS — M542 Cervicalgia: Secondary | ICD-10-CM | POA: Diagnosis not present

## 2013-06-08 DIAGNOSIS — IMO0001 Reserved for inherently not codable concepts without codable children: Secondary | ICD-10-CM | POA: Diagnosis not present

## 2013-06-08 DIAGNOSIS — Z85820 Personal history of malignant melanoma of skin: Secondary | ICD-10-CM | POA: Diagnosis not present

## 2013-06-10 ENCOUNTER — Ambulatory Visit: Payer: Medicare Other | Admitting: Rehabilitation

## 2013-06-10 DIAGNOSIS — Z85820 Personal history of malignant melanoma of skin: Secondary | ICD-10-CM | POA: Diagnosis not present

## 2013-06-10 DIAGNOSIS — M542 Cervicalgia: Secondary | ICD-10-CM | POA: Diagnosis not present

## 2013-06-10 DIAGNOSIS — IMO0001 Reserved for inherently not codable concepts without codable children: Secondary | ICD-10-CM | POA: Diagnosis not present

## 2013-06-15 ENCOUNTER — Ambulatory Visit: Payer: Medicare Other | Admitting: Rehabilitation

## 2013-06-15 DIAGNOSIS — IMO0001 Reserved for inherently not codable concepts without codable children: Secondary | ICD-10-CM | POA: Diagnosis not present

## 2013-06-15 DIAGNOSIS — M542 Cervicalgia: Secondary | ICD-10-CM | POA: Diagnosis not present

## 2013-06-15 DIAGNOSIS — Z85820 Personal history of malignant melanoma of skin: Secondary | ICD-10-CM | POA: Diagnosis not present

## 2013-06-17 ENCOUNTER — Other Ambulatory Visit: Payer: Medicare Other

## 2013-06-17 ENCOUNTER — Ambulatory Visit: Payer: Medicare Other | Admitting: Rehabilitation

## 2013-06-19 ENCOUNTER — Ambulatory Visit: Payer: Medicare Other | Admitting: Rehabilitation

## 2013-06-19 DIAGNOSIS — IMO0001 Reserved for inherently not codable concepts without codable children: Secondary | ICD-10-CM | POA: Diagnosis not present

## 2013-06-19 DIAGNOSIS — M542 Cervicalgia: Secondary | ICD-10-CM | POA: Diagnosis not present

## 2013-06-19 DIAGNOSIS — Z85820 Personal history of malignant melanoma of skin: Secondary | ICD-10-CM | POA: Diagnosis not present

## 2013-06-22 ENCOUNTER — Ambulatory Visit: Payer: Medicare Other | Admitting: Rehabilitation

## 2013-06-22 DIAGNOSIS — Z85820 Personal history of malignant melanoma of skin: Secondary | ICD-10-CM | POA: Diagnosis not present

## 2013-06-22 DIAGNOSIS — M542 Cervicalgia: Secondary | ICD-10-CM | POA: Diagnosis not present

## 2013-06-22 DIAGNOSIS — IMO0001 Reserved for inherently not codable concepts without codable children: Secondary | ICD-10-CM | POA: Diagnosis not present

## 2013-06-24 ENCOUNTER — Ambulatory Visit: Payer: Medicare Other | Admitting: Rehabilitation

## 2013-06-24 ENCOUNTER — Other Ambulatory Visit (INDEPENDENT_AMBULATORY_CARE_PROVIDER_SITE_OTHER): Payer: Medicare Other

## 2013-06-24 DIAGNOSIS — M542 Cervicalgia: Secondary | ICD-10-CM | POA: Diagnosis not present

## 2013-06-24 DIAGNOSIS — I10 Essential (primary) hypertension: Secondary | ICD-10-CM

## 2013-06-24 DIAGNOSIS — IMO0001 Reserved for inherently not codable concepts without codable children: Secondary | ICD-10-CM | POA: Diagnosis not present

## 2013-06-24 DIAGNOSIS — Z85820 Personal history of malignant melanoma of skin: Secondary | ICD-10-CM | POA: Diagnosis not present

## 2013-06-24 LAB — POTASSIUM: Potassium: 3.7 mEq/L (ref 3.5–5.1)

## 2013-06-25 ENCOUNTER — Encounter: Payer: Self-pay | Admitting: *Deleted

## 2013-06-29 ENCOUNTER — Ambulatory Visit: Payer: Medicare Other | Admitting: Rehabilitation

## 2013-06-29 DIAGNOSIS — M542 Cervicalgia: Secondary | ICD-10-CM | POA: Diagnosis not present

## 2013-06-29 DIAGNOSIS — Z85820 Personal history of malignant melanoma of skin: Secondary | ICD-10-CM | POA: Diagnosis not present

## 2013-06-29 DIAGNOSIS — IMO0001 Reserved for inherently not codable concepts without codable children: Secondary | ICD-10-CM | POA: Diagnosis not present

## 2013-07-01 ENCOUNTER — Ambulatory Visit: Payer: Medicare Other | Admitting: Rehabilitation

## 2013-07-01 DIAGNOSIS — Z85820 Personal history of malignant melanoma of skin: Secondary | ICD-10-CM | POA: Diagnosis not present

## 2013-07-01 DIAGNOSIS — M5030 Other cervical disc degeneration, unspecified cervical region: Secondary | ICD-10-CM | POA: Diagnosis not present

## 2013-07-01 DIAGNOSIS — M542 Cervicalgia: Secondary | ICD-10-CM | POA: Diagnosis not present

## 2013-07-01 DIAGNOSIS — IMO0001 Reserved for inherently not codable concepts without codable children: Secondary | ICD-10-CM | POA: Diagnosis not present

## 2013-07-08 ENCOUNTER — Ambulatory Visit: Payer: Medicare Other | Attending: Orthopaedic Surgery | Admitting: Rehabilitation

## 2013-07-08 DIAGNOSIS — Z85820 Personal history of malignant melanoma of skin: Secondary | ICD-10-CM | POA: Diagnosis not present

## 2013-07-08 DIAGNOSIS — IMO0001 Reserved for inherently not codable concepts without codable children: Secondary | ICD-10-CM | POA: Insufficient documentation

## 2013-07-08 DIAGNOSIS — M542 Cervicalgia: Secondary | ICD-10-CM | POA: Diagnosis not present

## 2013-07-10 ENCOUNTER — Ambulatory Visit: Payer: Medicare Other | Admitting: Rehabilitation

## 2013-07-13 ENCOUNTER — Ambulatory Visit: Payer: Medicare Other | Admitting: Rehabilitation

## 2013-07-14 ENCOUNTER — Ambulatory Visit (INDEPENDENT_AMBULATORY_CARE_PROVIDER_SITE_OTHER): Payer: Medicare Other | Admitting: Pulmonary Disease

## 2013-07-14 ENCOUNTER — Encounter: Payer: Self-pay | Admitting: Pulmonary Disease

## 2013-07-14 VITALS — BP 142/82 | HR 70 | Temp 97.9°F | Ht 63.0 in | Wt 211.0 lb

## 2013-07-14 DIAGNOSIS — G4733 Obstructive sleep apnea (adult) (pediatric): Secondary | ICD-10-CM | POA: Diagnosis not present

## 2013-07-14 DIAGNOSIS — G2581 Restless legs syndrome: Secondary | ICD-10-CM | POA: Diagnosis not present

## 2013-07-14 NOTE — Patient Instructions (Addendum)
Check CPAP download - [facility] Based on this ,we will decide on need for CPAP titration study Trial of nasonex in addition to claritin

## 2013-07-14 NOTE — Progress Notes (Signed)
Subjective:    Patient ID: [name], female    DOB: 20-Feb-1946, 67 y.o.   MRN: [id number]  HPI 67 year old retired woman presents for evaluation of excessive somnolence and obstructive sleep apnea. Epworth sleepiness score is 13/24 and she describes sleepiness and very suppression such as sitting and reading, watching TV or lying down to rest in the afternoon . Severe obstructive sleep apnea was diagnosed by sleep study in 2003 which showed AHI of 81 events per hour and lowest desaturation to 57%. This was corrected by CPAP of 12 cm in with residual events of 5.6 events per hour on a study in 2007. Nocturnal oximetry in 2014 happened he did not show any desaturations.  Her sleep pattern was severely disrupted due to treatment for melanoma from 2011-2013 requiring interferon therapy. She was also diagnosed with restless legs syndrome, which responded to Neurontin, she is hesitant to take narcotics for this. She has Cervical disc disease and has required Flexeril for muscle spasms.  Bedtime is between 1:48 PM, sleep latency is 5-20 minutes she sleeps with an eye mask on her side with one pillow. She reports compliance with CPAP and nasal pillows ( [facility]) . She denies nocturnal awakenings and is out of bed between 4 to 8 AM feeling refreshed. On occasion she will take it 20 minutes to 2 hour nap in the afternoon and feels refreshed on awakening. There is no history suggestive of cataplexy, sleep paralysis or parasomnias  She has regained the 50 pounds that she lost during cancer treatment   Past Medical History  Diagnosis Date  . Hypertension   . DVT (deep venous thrombosis) 2011    small, developed after venous ablation  . Tinnitus   . Asthma in adult   . Chronic venous insufficiency   . Melanoma 06/29/10    MALIGNANT MELANOMA R SHOULDER/SUPRASCAPULAR BACK s/p interferon chemo and XRT  . History of radiation therapy 07/19/11-08/25/2011    RIGHT AXILLARY REGION/METASTATIC   . History of kidney stones 1980s  . Neuromuscular disorder     muscle spasms  . Arthritis   . Seasonal allergies   . OSA (obstructive sleep apnea)     on CPAP  . RLS (restless legs syndrome)   . Anesthesia complication     trouble waking up 2/2 CPAP  . Unspecified vitamin D deficiency 03/18/2013  . Hearing loss sensory, bilateral 2013    mod-severe high freq sensorineural ([facility])    Past Surgical History  Procedure Laterality Date  . Venous ablation  2011    left leg  . Lumbar laminectomy  2001    L4/5  . Deep axillary sentinel node biopsy / excision  2012    RIGHT  . Portacath placement      left subclavian  . Breast surgery Right 1990    BX   . Dilation and curettage of uterus  2010  . Port-a-cath removal  12/05/2011    Procedure: REMOVAL PORT-A-CATH;  Surgeon: Emelia Loron, MD;  Location: [facility];  Service: General;  Laterality: N/A;  left port removal  . Breast biopsy Left 2013    BENIGN  . Knee surgery  1985    left  . Melanoma excision  2011    Right shoulder, with sentinel lymph node biopsy  . Dexa  12/2009    WNL  . Cardiovascular stress test  2010    normal stress test, EF 66%    Allergies  Allergen Reactions  .  Sulfa Drugs Cross Reactors Swelling    Sulfa medications-swelling of face    History   Social History  . Marital Status: Divorced    Spouse Name: N/A    Number of Children: 0  . Years of Education: N/A   Occupational History  . retired     Emergency planning/management officer    Social History Main Topics  . Smoking status: Former Smoker    Types: Cigarettes    Quit date: 11/05/1965  . Smokeless tobacco: Never Used     Comment: socially as a teen  . Alcohol Use: Yes     Comment: drinks wine occasionally  . Drug Use: No  . Sexual Activity: Not on file   Other Topics Concern  . Not on file   Social History Narrative   Lives alone.  Sister and husband live next door   Lives in RV.   Occupation: retired, prior  worked as Emergency planning/management officer at [employer]   Edu: some college   Diet: poor - good water, rare fruits/vegetables    Family History  Problem Relation Age of Onset  . Cancer Mother 13    uterine  . Cancer Cousin     x2, breast  . CAD Father 70    MI  . Hypertension Father 46  . Diabetes Sister   . Cancer Paternal Grandmother     melanoma, possibly  . Alzheimer's disease Father 23      Review of Systems  Constitutional: Negative for fever and unexpected weight change.  HENT: Negative for ear pain, nosebleeds, congestion, sore throat, rhinorrhea, sneezing, trouble swallowing, dental problem, postnasal drip and sinus pressure.   Eyes: Negative for redness and itching.  Respiratory: Positive for shortness of breath. Negative for cough, chest tightness and wheezing.   Cardiovascular: Negative for palpitations and leg swelling.  Gastrointestinal: Negative for nausea and vomiting.  Genitourinary: Negative for dysuria.  Musculoskeletal: Negative for joint swelling.  Skin: Negative for rash.  Neurological: Negative for headaches.  Hematological: Does not bruise/bleed easily.  Psychiatric/Behavioral: Negative for dysphoric mood. The patient is not nervous/anxious.        Objective:   Physical Exam  Gen. Pleasant, obese, in no distress, normal affect ENT - no lesions, no post nasal drip, class 2-3 airway Neck: No JVD, no thyromegaly, no carotid bruits Lungs: no use of accessory muscles, no dullness to percussion, decreased without rales or rhonchi  Cardiovascular: Rhythm regular, heart sounds  normal, no murmurs or gallops, no peripheral edema Abdomen: soft and non-tender, no hepatosplenomegaly, BS normal. Musculoskeletal: No deformities, no cyanosis or clubbing Neuro:  alert, non focal, no tremors       Assessment & Plan:

## 2013-07-14 NOTE — Assessment & Plan Note (Addendum)
Check CPAP download - [facility] Based on this ,we will decide on need for CPAP titration study  Weight loss encouraged, compliance with goal of at least 4-6 hrs every night is the expectation. Advised against medications with sedative side effects Cautioned against driving when sleepy - understanding that sleepiness will vary on a day to day basis

## 2013-07-15 ENCOUNTER — Ambulatory Visit: Payer: Medicare Other | Admitting: Rehabilitation

## 2013-07-15 NOTE — Assessment & Plan Note (Signed)
Can consider mirapex or neurontin if symptoms worse

## 2013-07-20 ENCOUNTER — Ambulatory Visit: Payer: Medicare Other | Admitting: Rehabilitation

## 2013-07-22 ENCOUNTER — Ambulatory Visit: Payer: Medicare Other | Admitting: Rehabilitation

## 2013-07-27 ENCOUNTER — Ambulatory Visit: Payer: Medicare Other | Admitting: Rehabilitation

## 2013-07-29 ENCOUNTER — Ambulatory Visit: Payer: Medicare Other | Admitting: Rehabilitation

## 2013-07-30 ENCOUNTER — Other Ambulatory Visit: Payer: Self-pay | Admitting: Family Medicine

## 2013-07-30 NOTE — Telephone Encounter (Signed)
Does patient need to continue this? Or do you want to wait for Dr. Reece Agar to return?

## 2013-07-31 DIAGNOSIS — M4802 Spinal stenosis, cervical region: Secondary | ICD-10-CM | POA: Diagnosis not present

## 2013-08-03 ENCOUNTER — Ambulatory Visit: Payer: Medicare Other | Admitting: Rehabilitation

## 2013-08-05 ENCOUNTER — Ambulatory Visit: Payer: Medicare Other | Admitting: Rehabilitation

## 2013-08-05 DIAGNOSIS — D485 Neoplasm of uncertain behavior of skin: Secondary | ICD-10-CM | POA: Diagnosis not present

## 2013-08-05 DIAGNOSIS — Z85820 Personal history of malignant melanoma of skin: Secondary | ICD-10-CM | POA: Diagnosis not present

## 2013-08-05 DIAGNOSIS — M5412 Radiculopathy, cervical region: Secondary | ICD-10-CM | POA: Diagnosis not present

## 2013-08-05 DIAGNOSIS — L538 Other specified erythematous conditions: Secondary | ICD-10-CM | POA: Diagnosis not present

## 2013-08-05 DIAGNOSIS — D2330 Other benign neoplasm of skin of unspecified part of face: Secondary | ICD-10-CM | POA: Diagnosis not present

## 2013-08-05 DIAGNOSIS — D235 Other benign neoplasm of skin of trunk: Secondary | ICD-10-CM | POA: Diagnosis not present

## 2013-08-05 DIAGNOSIS — Z85828 Personal history of other malignant neoplasm of skin: Secondary | ICD-10-CM | POA: Diagnosis not present

## 2013-08-06 ENCOUNTER — Encounter: Payer: Self-pay | Admitting: Family Medicine

## 2013-08-06 ENCOUNTER — Ambulatory Visit (INDEPENDENT_AMBULATORY_CARE_PROVIDER_SITE_OTHER): Payer: Medicare Other | Admitting: Family Medicine

## 2013-08-06 VITALS — BP 128/86 | HR 76 | Temp 98.4°F | Wt 212.8 lb

## 2013-08-06 DIAGNOSIS — Z23 Encounter for immunization: Secondary | ICD-10-CM

## 2013-08-06 DIAGNOSIS — I872 Venous insufficiency (chronic) (peripheral): Secondary | ICD-10-CM | POA: Diagnosis not present

## 2013-08-06 MED ORDER — ZOSTER VACCINE LIVE 19400 UNT/0.65ML ~~LOC~~ SOLR: 0.6500 mL | Freq: Once | SUBCUTANEOUS | Status: DC

## 2013-08-06 NOTE — Assessment & Plan Note (Signed)
With reticulated veins and scab formation but no current bleeding. Recommended compression stockings (grade 2 stockings) for CVI and varicose veins with reticulated veins. Recommended elevation of legs and avoiding salt. If bleed or progression of venous disease, will refer to vascular surgeon - pt is in agreement.

## 2013-08-06 NOTE — Patient Instructions (Addendum)
I recommend compression stockings - may pick up at local medical equipment supply store.  Script provided. Elevate legs and drink lots of water, avoid salt. Let me know if worsening for referral to vascular doctors. Flu shot today Script for shingles shot today.

## 2013-08-06 NOTE — Addendum Note (Signed)
Addended by: Shon Millet on: 08/06/2013 01:46 PM   Modules accepted: Orders

## 2013-08-06 NOTE — Progress Notes (Signed)
  Subjective:    Patient ID: [name], female    DOB: 14-Mar-1946, 67 y.o.   MRN: [id number]  HPI CC: check R ankle vein  A few weeks ago noticing right ankle veins scabbed.  Becoming itchy some.  Veins s getting darker.  No leg pain.  No leg swelling.  No redness or fever of legs.  Punch biopsy of L leg 2009 - poorly healing wound 2/2 venous insufficiency. This was done by dermatologist then followed by wound center at [facility]. Venous ablation of L leg 2010. Used compression stockings in the past, not recently.  Past Medical History  Diagnosis Date  . Hypertension   . DVT (deep venous thrombosis) 2011    small, developed after venous ablation  . Tinnitus   . Asthma in adult   . Chronic venous insufficiency   . Melanoma 06/29/10    MALIGNANT MELANOMA R SHOULDER/SUPRASCAPULAR BACK s/p interferon chemo and XRT  . History of radiation therapy 07/19/11-08/25/2011    RIGHT AXILLARY REGION/METASTATIC  . History of kidney stones 1980s  . Neuromuscular disorder     muscle spasms  . Arthritis   . Seasonal allergies   . OSA (obstructive sleep apnea)     on CPAP  . RLS (restless legs syndrome)   . Anesthesia complication     trouble waking up 2/2 CPAP  . Unspecified vitamin D deficiency 03/18/2013  . Hearing loss sensory, bilateral 2013    mod-severe high freq sensorineural ([facility])    Past Surgical History  Procedure Laterality Date  . Venous ablation  2011    left leg  . Lumbar laminectomy  2001    L4/5  . Deep axillary sentinel node biopsy / excision  2012    RIGHT  . Portacath placement      left subclavian  . Breast surgery Right 1990    BX   . Dilation and curettage of uterus  2010  . Port-a-cath removal  12/05/2011    Procedure: REMOVAL PORT-A-CATH;  Surgeon: Emelia Loron, MD;  Location: [facility];  Service: General;  Laterality: N/A;  left port removal  . Breast biopsy Left 2013    BENIGN  . Knee surgery  1985    left  .  Melanoma excision  2011    Right shoulder, with sentinel lymph node biopsy  . Dexa  12/2009    WNL  . Cardiovascular stress test  2010    normal stress test, EF 66%    Review of Systems Per HPI    Objective:   Physical Exam  Nursing note and vitals reviewed. Constitutional: She appears well-developed and well-nourished. No distress.  Musculoskeletal: She exhibits no edema.  Skin: Skin is warm and dry. No rash noted. No erythema.  Reticulated veins bilateral medial and lateral ankles. Right lateral ankle with dry scab at site of reticulated vein       Assessment & Plan:

## 2013-08-16 ENCOUNTER — Telehealth: Payer: Self-pay | Admitting: Pulmonary Disease

## 2013-08-16 NOTE — Telephone Encounter (Signed)
Reviewed download on autoCPAP 04/2012 -11/2012 & 05/2013-08/03/13 >> avg pr 11 cm, residual 5/h, usage 5.5 h  OSA seems to be adequately treated. She should ideally try to use CPAP x 6h per night

## 2013-08-18 ENCOUNTER — Ambulatory Visit (INDEPENDENT_AMBULATORY_CARE_PROVIDER_SITE_OTHER): Payer: Medicare Other | Admitting: Pulmonary Disease

## 2013-08-18 ENCOUNTER — Encounter: Payer: Self-pay | Admitting: Pulmonary Disease

## 2013-08-18 VITALS — BP 124/68 | HR 78 | Ht 63.0 in | Wt 216.0 lb

## 2013-08-18 DIAGNOSIS — G4733 Obstructive sleep apnea (adult) (pediatric): Secondary | ICD-10-CM

## 2013-08-18 DIAGNOSIS — G2581 Restless legs syndrome: Secondary | ICD-10-CM | POA: Diagnosis not present

## 2013-08-18 NOTE — Assessment & Plan Note (Signed)
Doubt she has restless legs Call in 4 wks to report - if legs wore, will start requip low dose Check iron studies (iron/TIBC, ferritin level )

## 2013-08-18 NOTE — Progress Notes (Signed)
  Subjective:    Patient ID: [name], female    DOB: 08-02-1946, 67 y.o.   MRN: [id number]  HPI   67 year old retired woman presents for FU of excessive somnolence and obstructive sleep apnea.  Epworth sleepiness score is 13/24 and she describes sleepiness and very suppression such as sitting and reading, watching TV or lying down to rest in the afternoon .  Severe obstructive sleep apnea was diagnosed by sleep study in 2003 which showed AHI of 81 events per hour and lowest desaturation to 57%. This was corrected by CPAP of 12 cm in with residual events of 5.6 events per hour on a study in 2007. Nocturnal oximetry in 2014 happened he did not show any desaturations.  Her sleep pattern was severely disrupted due to treatment for melanoma from 2011-2013 requiring interferon therapy. She was also diagnosed with restless legs syndrome, which responded to Neurontin, she is hesitant to take narcotics for this. She has Cervical disc disease and has required Flexeril for muscle spasms.  Bedtime is between 1-2 AM, sleep latency is 5-20 minutes she sleeps with an eye mask on her side with one pillow. She reports compliance with CPAP and nasal pillows ( [facility]) . She denies nocturnal awakenings and is out of bed between 4 to 8 AM feeling refreshed. On occasion she will take it 20 minutes to 2 hour nap in the afternoon and feels refreshed on awakening.  She has regained the 50 pounds that she lost during cancer treatment     08/18/2013 Reviewed download on autoCPAP 04/2012 -11/2012 & 05/2013-08/03/13 >> avg pr 11 cm, residual 5/h, usage 5.5 h  OSA seems to be adequately treated.  She should ideally try to use CPAP x 6h per night      Pt is wearing CPAP nightly x 5.5 hrs a night. Pt was taking neurotin for RLS for years and then it became ineffective. She was taking flexeril for spasms in her neck and it helped also with RLS. She has MRI and she will need surgery. Since she will not need the  flexeril after this she will need something for RLS.   Review of Systems neg for any significant sore throat, dysphagia, itching, sneezing, nasal congestion or excess/ purulent secretions, fever, chills, sweats, unintended wt loss, pleuritic or exertional cp, hempoptysis, orthopnea pnd or change in chronic leg swelling. Also denies presyncope, palpitations, heartburn, abdominal pain, nausea, vomiting, diarrhea or change in bowel or urinary habits, dysuria,hematuria, rash, arthralgias, visual complaints, headache, numbness weakness or ataxia.     Objective:   Physical Exam  Gen. Pleasant, well-nourished, in no distress ENT - no lesions, no post nasal drip Neck: No JVD, no thyromegaly, no carotid bruits Lungs: no use of accessory muscles, no dullness to percussion, clear without rales or rhonchi  Cardiovascular: Rhythm regular, heart sounds  normal, no murmurs or gallops, no peripheral edema Musculoskeletal: No deformities, no cyanosis or clubbing       Assessment & Plan:

## 2013-08-18 NOTE — Telephone Encounter (Signed)
I spoke with patient about results and she verbalized understanding and had no questions. She has an appt scheduled to come in and see Dr. Vassie Loll this afternoon. I advised her we will send order to change pressure at that time so she can discuss this with him.

## 2013-08-18 NOTE — Patient Instructions (Addendum)
CPAP 12 cm appears to be adequate We discussed earlier bedtime 12 MN STOP flexeril Call in 4 wks to report - if legs wore, will start requip low dose Check iron studies (iron/TIBC, ferritin level )

## 2013-08-18 NOTE — Assessment & Plan Note (Signed)
CPAP 12 cm appears to be adequate We discussed earlier bedtime 12 MN STOP flexeril

## 2013-08-21 ENCOUNTER — Other Ambulatory Visit (HOSPITAL_BASED_OUTPATIENT_CLINIC_OR_DEPARTMENT_OTHER): Payer: Medicare Other | Admitting: Lab

## 2013-08-21 ENCOUNTER — Encounter: Payer: Self-pay | Admitting: Nurse Practitioner

## 2013-08-21 ENCOUNTER — Ambulatory Visit (HOSPITAL_BASED_OUTPATIENT_CLINIC_OR_DEPARTMENT_OTHER): Payer: Medicare Other | Admitting: Hematology & Oncology

## 2013-08-21 VITALS — BP 126/68 | HR 72 | Temp 97.7°F | Resp 14 | Ht 63.0 in | Wt 217.0 lb

## 2013-08-21 DIAGNOSIS — C439 Malignant melanoma of skin, unspecified: Secondary | ICD-10-CM

## 2013-08-21 DIAGNOSIS — D509 Iron deficiency anemia, unspecified: Secondary | ICD-10-CM | POA: Diagnosis not present

## 2013-08-21 DIAGNOSIS — C4359 Malignant melanoma of other part of trunk: Secondary | ICD-10-CM | POA: Diagnosis not present

## 2013-08-21 LAB — IRON AND TIBC CHCC
%SAT: 26 % (ref 21–57)
Iron: 73 ug/dL (ref 41–142)
TIBC: 280 ug/dL (ref 236–444)
UIBC: 207 ug/dL (ref 120–384)

## 2013-08-21 LAB — CMP (CANCER CENTER ONLY)
ALT(SGPT): 17 U/L (ref 10–47)
AST: 14 U/L (ref 11–38)
Albumin: 3.6 g/dL (ref 3.3–5.5)
Alkaline Phosphatase: 54 U/L (ref 26–84)
BUN, Bld: 18 mg/dL (ref 7–22)
CO2: 31 mEq/L (ref 18–33)
Calcium: 9.4 mg/dL (ref 8.0–10.3)
Chloride: 106 mEq/L (ref 98–108)
Creat: 0.8 mg/dl (ref 0.6–1.2)
Glucose, Bld: 88 mg/dL (ref 73–118)
Potassium: 4.1 mEq/L (ref 3.3–4.7)
Sodium: 144 mEq/L (ref 128–145)
Total Bilirubin: 0.5 mg/dl (ref 0.20–1.60)
Total Protein: 6.9 g/dL (ref 6.4–8.1)

## 2013-08-21 LAB — CBC WITH DIFFERENTIAL (CANCER CENTER ONLY)
BASO#: 0 10*3/uL (ref 0.0–0.2)
BASO%: 0.2 % (ref 0.0–2.0)
EOS%: 4.4 % (ref 0.0–7.0)
Eosinophils Absolute: 0.3 10*3/uL (ref 0.0–0.5)
HCT: 38.1 % (ref 34.8–46.6)
HGB: 12.5 g/dL (ref 11.6–15.9)
LYMPH#: 1.5 10*3/uL (ref 0.9–3.3)
LYMPH%: 23.1 % (ref 14.0–48.0)
MCH: 30.8 pg (ref 26.0–34.0)
MCHC: 32.8 g/dL (ref 32.0–36.0)
MCV: 94 fL (ref 81–101)
MONO#: 0.5 10*3/uL (ref 0.1–0.9)
MONO%: 7.8 % (ref 0.0–13.0)
NEUT#: 4.2 10*3/uL (ref 1.5–6.5)
NEUT%: 64.5 % (ref 39.6–80.0)
Platelets: 179 10*3/uL (ref 145–400)
RBC: 4.06 10*6/uL (ref 3.70–5.32)
RDW: 12.8 % (ref 11.1–15.7)
WBC: 6.5 10*3/uL (ref 3.9–10.0)

## 2013-08-21 LAB — LACTATE DEHYDROGENASE: LDH: 169 U/L (ref 94–250)

## 2013-08-21 LAB — FERRITIN CHCC: Ferritin: 105 ng/ml (ref 9–269)

## 2013-08-21 NOTE — Progress Notes (Signed)
Labwork (Iron/TIBC) sent to Dr. Reginia Naas office. As requested per Dr. Myna Hidalgo.

## 2013-08-21 NOTE — Progress Notes (Signed)
This office note has been dictated.

## 2013-08-22 NOTE — Progress Notes (Signed)
CC:   Larina Earthly, M.D. Lubertha Basque Jerl Santos, M.D. Stefani Dama, M.D. Eustaquio Boyden, MD  DIAGNOSIS:  Stage IIIC (T3b N3 M0) melanoma of the right shoulder.  CURRENT THERAPY:  Observation.  INTERIM HISTORY:  [name] comes in for her followup.  It sounds like she is going to need to have neck surgery.  She has been having a lot of problems with her neck.  She did undergo an MRI of the cervical spine. This was done at orthopedics office.  She said that she has spinal stenosis.  She is going to be seeing Dr. Danielle Dess in a couple of weeks.  From my point of view, I do not see any problems with her having surgery.  She still has problems with lethargy during the daytime.  She does have sleep apnea.  She does use her CPAP at home.  She has had no problems with cough.  There is no change in bowel or bladder habits.  She has had no rashes. She did see Dr. Yetta Barre of dermatology earlier this week.  She said that he is watching a lesion on her stomach but does not think that this is a problem right now.  She has had no cough.  There has been no shortness of breath.  She has had no leg swelling.  PHYSICAL EXAMINATION:  General:  This is a well-developed, well- nourished white female in no obvious distress.  Vital signs: Temperature of 97.7, pulse 72, respiratory rate 14, blood pressure 126/68.  Weight is 217 pounds.  Head and neck:  Normocephalic, atraumatic skull.  There are no ocular or oral lesions.  There are no palpable cervical or supraclavicular lymph nodes.  Lungs:  Clear bilaterally.  Cardiac:  Regular rate and rhythm with a normal S1 and S2. There are no murmurs, rubs or bruits.  Abdomen:  Soft.  She has good bowel sounds.  There is no fluid wave.  There is no palpable abdominal mass.  There is no palpable hepatosplenomegaly.  Axillary:  No bilateral axillary adenopathy.  Back:  No tenderness over the spine, ribs, or hips.  She may have a little kyphosis.  Extremities:  No  clubbing, cyanosis or edema.  Neurologic:  No focal neurological deficits.  LABORATORY STUDIES:  White cell count is 6.5, hemoglobin 12.5, hematocrit 38.1, platelet count 179.  Sodium 144, potassium 4.1, BUN 18, creatinine 0.8.  Calcium 9.4 with an albumin of 3.6.  IMPRESSION:  [name] is a very charming 67 year old white female with a history of stage IIIC melanoma of the right shoulder.  She underwent wide local excision with a right axillary node biopsy.  She had 8 positive lymph nodes.  She completed high-dose adjuvant interferon back in December 2012.  She then underwent radiation therapy to the right axilla.  So far, I have seen no evidence of recurrent disease.  I do not see a problem for having the cervical spine surgery.  I do not foresee any issues hematologically with her having surgery or any bleeding issues after surgery.  We will plan to get her back in 4 months as per usual.  We did send off iron studies on her to see if she is iron deficient despite not having anemia.    ______________________________ Josph Macho, M.D. PRE/MEDQ  D:  08/21/2013  T:  08/22/2013  Job:  [id number]

## 2013-08-24 ENCOUNTER — Telehealth: Payer: Self-pay | Admitting: Nurse Practitioner

## 2013-08-24 NOTE — Telephone Encounter (Addendum)
Message copied by Glee Arvin on Mon Aug 24, 2013  1:28 PM ------      Message from: Josph Macho      Created: Fri Aug 21, 2013  6:43 PM       Please call and let her know that her labs and iron studies are okay. I did send her iron studies to Dr. Felipa Eth.  Thanks. Pete ------LVM with letting pt know about her labs and the results being sent to Dr. Vassie Loll. Instructed her if she had further questions to please contact our office.

## 2013-09-23 DIAGNOSIS — M47812 Spondylosis without myelopathy or radiculopathy, cervical region: Secondary | ICD-10-CM | POA: Diagnosis not present

## 2013-09-27 ENCOUNTER — Encounter: Payer: Self-pay | Admitting: Family Medicine

## 2013-09-29 DIAGNOSIS — Z124 Encounter for screening for malignant neoplasm of cervix: Secondary | ICD-10-CM | POA: Diagnosis not present

## 2013-09-29 DIAGNOSIS — Z01419 Encounter for gynecological examination (general) (routine) without abnormal findings: Secondary | ICD-10-CM | POA: Diagnosis not present

## 2013-10-13 DIAGNOSIS — Z1231 Encounter for screening mammogram for malignant neoplasm of breast: Secondary | ICD-10-CM | POA: Diagnosis not present

## 2013-10-14 ENCOUNTER — Other Ambulatory Visit: Payer: Self-pay | Admitting: Family Medicine

## 2013-11-23 ENCOUNTER — Other Ambulatory Visit: Payer: Self-pay | Admitting: Family Medicine

## 2013-12-22 ENCOUNTER — Ambulatory Visit: Payer: Medicare Other | Admitting: Hematology & Oncology

## 2013-12-22 ENCOUNTER — Other Ambulatory Visit: Payer: Medicare Other | Admitting: Lab

## 2013-12-29 ENCOUNTER — Telehealth: Payer: Self-pay | Admitting: Hematology & Oncology

## 2013-12-29 NOTE — Telephone Encounter (Signed)
Pt called to cancel appt for 12/30/2013.

## 2013-12-30 ENCOUNTER — Other Ambulatory Visit: Payer: Medicare Other | Admitting: Lab

## 2013-12-30 ENCOUNTER — Ambulatory Visit: Payer: Medicare Other | Admitting: Hematology & Oncology

## 2014-01-13 ENCOUNTER — Other Ambulatory Visit: Payer: Self-pay

## 2014-01-13 MED ORDER — ALBUTEROL SULFATE HFA 108 (90 BASE) MCG/ACT IN AERS: 2.0000 | INHALATION_SPRAY | Freq: Four times a day (QID) | RESPIRATORY_TRACT | Status: DC | PRN

## 2014-01-13 NOTE — Telephone Encounter (Signed)
Patient notified

## 2014-01-13 NOTE — Telephone Encounter (Signed)
Pt left v/m requesting new rx for Pro air HFA to [facility]. Dr Darnell Level has no filled before.Please advise.

## 2014-01-13 NOTE — Telephone Encounter (Signed)
plz notify this was sent in. 

## 2014-02-03 DIAGNOSIS — M793 Panniculitis, unspecified: Secondary | ICD-10-CM | POA: Diagnosis not present

## 2014-02-03 DIAGNOSIS — R21 Rash and other nonspecific skin eruption: Secondary | ICD-10-CM | POA: Diagnosis not present

## 2014-02-03 DIAGNOSIS — L253 Unspecified contact dermatitis due to other chemical products: Secondary | ICD-10-CM | POA: Diagnosis not present

## 2014-02-03 DIAGNOSIS — D2330 Other benign neoplasm of skin of unspecified part of face: Secondary | ICD-10-CM | POA: Diagnosis not present

## 2014-02-03 DIAGNOSIS — Z85820 Personal history of malignant melanoma of skin: Secondary | ICD-10-CM | POA: Diagnosis not present

## 2014-02-03 DIAGNOSIS — Z85828 Personal history of other malignant neoplasm of skin: Secondary | ICD-10-CM | POA: Diagnosis not present

## 2014-02-03 DIAGNOSIS — L570 Actinic keratosis: Secondary | ICD-10-CM | POA: Diagnosis not present

## 2014-02-03 DIAGNOSIS — L820 Inflamed seborrheic keratosis: Secondary | ICD-10-CM | POA: Diagnosis not present

## 2014-02-03 DIAGNOSIS — D235 Other benign neoplasm of skin of trunk: Secondary | ICD-10-CM | POA: Diagnosis not present

## 2014-02-17 ENCOUNTER — Telehealth: Payer: Self-pay | Admitting: *Deleted

## 2014-02-17 NOTE — Telephone Encounter (Signed)
PA form for Benicar in your IN box for completion. Please return to me.

## 2014-02-17 NOTE — Telephone Encounter (Signed)
Filed and placed in Kim's box.  I discussed this with patient last year. If denied, may send in trial of losartan hctz 50/12.5 one pill Qdaily #30 RF 6.

## 2014-02-18 ENCOUNTER — Telehealth: Payer: Self-pay | Admitting: Hematology & Oncology

## 2014-02-18 NOTE — Telephone Encounter (Signed)
Form faxed and will await determination.

## 2014-02-18 NOTE — Telephone Encounter (Signed)
Pt made 5-13 appointment

## 2014-02-23 MED ORDER — LOSARTAN POTASSIUM-HCTZ 50-12.5 MG PO TABS: 1.0000 | ORAL_TABLET | Freq: Every day | ORAL | Status: DC

## 2014-02-23 NOTE — Telephone Encounter (Signed)
Pt left v/m returning call.pt request cb.

## 2014-02-23 NOTE — Telephone Encounter (Addendum)
Benicar denied. Sent in [facility] as directed. Med list updated. Message left for patient to return my call to advise her of change in medication.

## 2014-02-25 NOTE — Telephone Encounter (Signed)
Message left for patient to return my call.  

## 2014-02-26 NOTE — Telephone Encounter (Signed)
Patient notified and verbalized understanding. 

## 2014-03-10 ENCOUNTER — Other Ambulatory Visit: Payer: Self-pay | Admitting: Family Medicine

## 2014-03-11 ENCOUNTER — Other Ambulatory Visit: Payer: Self-pay | Admitting: Family Medicine

## 2014-03-11 NOTE — Telephone Encounter (Signed)
Last office visit 08/06/2013.  Last Vit D was normal on 05/11/2013 at 51 ng/ml.  Ok to refill?

## 2014-03-11 NOTE — Telephone Encounter (Signed)
Will continue for now. Consider switch to OTC dose at next refill.

## 2014-03-17 ENCOUNTER — Ambulatory Visit (HOSPITAL_BASED_OUTPATIENT_CLINIC_OR_DEPARTMENT_OTHER): Payer: Medicare Other | Admitting: Hematology & Oncology

## 2014-03-17 ENCOUNTER — Other Ambulatory Visit (HOSPITAL_BASED_OUTPATIENT_CLINIC_OR_DEPARTMENT_OTHER): Payer: Medicare Other | Admitting: Lab

## 2014-03-17 ENCOUNTER — Ambulatory Visit (INDEPENDENT_AMBULATORY_CARE_PROVIDER_SITE_OTHER)
Admission: RE | Admit: 2014-03-17 | Discharge: 2014-03-17 | Disposition: A | Discharge status: Home / Self Care | Payer: Medicare Other | Source: Ambulatory Visit | Attending: Pulmonary Disease | Admitting: Pulmonary Disease

## 2014-03-17 ENCOUNTER — Ambulatory Visit (INDEPENDENT_AMBULATORY_CARE_PROVIDER_SITE_OTHER): Payer: Medicare Other | Admitting: Pulmonary Disease

## 2014-03-17 ENCOUNTER — Encounter: Payer: Self-pay | Admitting: Pulmonary Disease

## 2014-03-17 VITALS — BP 124/69 | HR 72 | Temp 98.1°F | Resp 18 | Ht 63.0 in | Wt 218.0 lb

## 2014-03-17 VITALS — BP 134/86 | HR 68 | Ht 63.0 in | Wt 221.0 lb

## 2014-03-17 DIAGNOSIS — J309 Allergic rhinitis, unspecified: Secondary | ICD-10-CM

## 2014-03-17 DIAGNOSIS — C439 Malignant melanoma of skin, unspecified: Secondary | ICD-10-CM

## 2014-03-17 DIAGNOSIS — R0989 Other specified symptoms and signs involving the circulatory and respiratory systems: Secondary | ICD-10-CM | POA: Diagnosis not present

## 2014-03-17 DIAGNOSIS — C44601 Unspecified malignant neoplasm of skin of unspecified upper limb, including shoulder: Secondary | ICD-10-CM

## 2014-03-17 DIAGNOSIS — J45909 Unspecified asthma, uncomplicated: Secondary | ICD-10-CM | POA: Diagnosis not present

## 2014-03-17 DIAGNOSIS — R0600 Dyspnea, unspecified: Secondary | ICD-10-CM

## 2014-03-17 DIAGNOSIS — G4733 Obstructive sleep apnea (adult) (pediatric): Secondary | ICD-10-CM

## 2014-03-17 DIAGNOSIS — R0609 Other forms of dyspnea: Secondary | ICD-10-CM | POA: Diagnosis not present

## 2014-03-17 DIAGNOSIS — C4360 Malignant melanoma of unspecified upper limb, including shoulder: Secondary | ICD-10-CM | POA: Diagnosis not present

## 2014-03-17 LAB — CMP (CANCER CENTER ONLY)
ALT(SGPT): 23 U/L (ref 10–47)
AST: 19 U/L (ref 11–38)
Albumin: 4 g/dL (ref 3.3–5.5)
Alkaline Phosphatase: 48 U/L (ref 26–84)
BUN, Bld: 13 mg/dL (ref 7–22)
CO2: 29 mEq/L (ref 18–33)
Calcium: 9.6 mg/dL (ref 8.0–10.3)
Chloride: 104 mEq/L (ref 98–108)
Creat: 0.7 mg/dl (ref 0.6–1.2)
Glucose, Bld: 98 mg/dL (ref 73–118)
Potassium: 4.3 mEq/L (ref 3.3–4.7)
Sodium: 144 mEq/L (ref 128–145)
Total Bilirubin: 0.5 mg/dl (ref 0.20–1.60)
Total Protein: 7.2 g/dL (ref 6.4–8.1)

## 2014-03-17 LAB — CBC WITH DIFFERENTIAL (CANCER CENTER ONLY)
BASO#: 0 10*3/uL (ref 0.0–0.2)
BASO%: 0.3 % (ref 0.0–2.0)
EOS%: 1.7 % (ref 0.0–7.0)
Eosinophils Absolute: 0.1 10*3/uL (ref 0.0–0.5)
HCT: 40 % (ref 34.8–46.6)
HGB: 13.2 g/dL (ref 11.6–15.9)
LYMPH#: 1.8 10*3/uL (ref 0.9–3.3)
LYMPH%: 25.2 % (ref 14.0–48.0)
MCH: 30.7 pg (ref 26.0–34.0)
MCHC: 33 g/dL (ref 32.0–36.0)
MCV: 93 fL (ref 81–101)
MONO#: 0.4 10*3/uL (ref 0.1–0.9)
MONO%: 6.2 % (ref 0.0–13.0)
NEUT#: 4.7 10*3/uL (ref 1.5–6.5)
NEUT%: 66.6 % (ref 39.6–80.0)
Platelets: 199 10*3/uL (ref 145–400)
RBC: 4.3 10*6/uL (ref 3.70–5.32)
RDW: 13.1 % (ref 11.1–15.7)
WBC: 7.1 10*3/uL (ref 3.9–10.0)

## 2014-03-17 LAB — LACTATE DEHYDROGENASE: LDH: 177 U/L (ref 94–250)

## 2014-03-17 NOTE — Patient Instructions (Signed)
Will get report from CPAP machine Chest xray today Will schedule breathing test (PFT) Follow up in Dr. Elsworth Soho in 6 weeks

## 2014-03-17 NOTE — Progress Notes (Signed)
Chief Complaint  Patient presents with  . Acute Visit    Presents today with increased SOB with dry cough. Onset was about 1 month ago when pollen started.    History of Present Illness: [name] is a 68 y.o. female former smoker with OSA, and RLS.  She is followed by Dr. Elsworth Soho for sleep apnea.  She has noticed more trouble with her breathing.  This has been present for at least one month.  She gets a dry cough also.  She sometimes gets wheezing.  She has trouble with allergies.  She notices trouble with her breathing sometimes when asleep.  She denies chest pain, fever, rashes, or leg swelling.  She has hx of melanoma and had radiation to her Rt upper back area.  She smoked cigarettes briefly when younger.  She does report history of asthma.   [name]  has a past medical history of Hypertension; DVT (deep venous thrombosis) (2011); Tinnitus; Asthma in adult; Chronic venous insufficiency; Melanoma (06/29/10); History of radiation therapy (07/19/11-08/25/2011); History of kidney stones (1980s); Neuromuscular disorder; Arthritis; Seasonal allergies; OSA (obstructive sleep apnea); RLS (restless legs syndrome); Anesthesia complication; Unspecified vitamin D deficiency (03/18/2013); Hearing loss sensory, bilateral (2013); and Cervical spondylosis (2012).  [name]  has past surgical history that includes Venous ablation (2010); Lumbar laminectomy (2001); Deep axillary sentinel node biopsy / excision (2012); Portacath placement; Breast surgery (Right, 1990); Dilation and curettage of uterus (2010); Port-a-cath removal (12/05/2011); Breast biopsy (Left, 2013); Knee surgery (1985); Melanoma excision (2011); DEXA (12/2009); and Cardiovascular stress test (2010).  Prior to Admission medications   Medication Sig Start Date End Date Taking? Authorizing Provider  Acetaminophen (TYLENOL ARTHRITIS EXT RELIEF PO) Take 1 tablet by mouth daily as needed.   Yes Historical Provider, MD  albuterol  (PROAIR HFA) 108 (90 BASE) MCG/ACT inhaler Inhale 2 puffs into the lungs every 6 (six) hours as needed for wheezing or shortness of breath. 01/13/14  Yes [name], MD  aspirin 162 MG EC tablet Take 162 mg by mouth daily.   Yes Historical Provider, MD  KLOR-CON M10 10 MEQ tablet TAKE 2 TABLETS BY MOUTH DAILY. 11/23/13  Yes [name], MD  loratadine (CLARITIN) 10 MG tablet Take 10 mg by mouth daily as needed.     Yes Historical Provider, MD  losartan-hydrochlorothiazide (HYZAAR) 50-12.5 MG per tablet Take 1 tablet by mouth daily. 02/23/14  Yes [name], MD  montelukast (SINGULAIR) 10 MG tablet TAKE 1 TABLET BY MOUTH DAILY 10/14/13  Yes [name], MD  Multiple Vitamins-Minerals (MULTIVITAMIN PO) Take by mouth every morning.   Yes Historical Provider, MD  Probiotic Product (PROBIOTIC DAILY PO) Take by mouth every morning.   Yes Historical Provider, MD  Vitamin D, Ergocalciferol, (DRISDOL) 50000 UNITS CAPS capsule TAKE ONE CAPSULE BY MOUTH WEEKLY 03/11/14  Yes [name], MD  zoster vaccine live, PF, (ZOSTAVAX) 22297 UNT/0.65ML injection Inject 19,400 Units into the skin once. 08/06/13  Yes [name], MD    Allergies  Allergen Reactions  . Sulfa Drugs Cross Reactors Swelling    Sulfa medications-swelling of face     Physical Exam:  General - No distress ENT - No sinus tenderness, no oral exudate, no LAN Cardiac - s1s2 regular, no murmur Chest - No wheeze/rales/dullness Back - No focal tenderness Abd - Soft, non-tender Ext - No edema Neuro - Normal strength Skin - No rashes Psych - normal mood, and behavior   Assessment/Plan:  Amber Mires, MD [facility] Pager:  [phone]

## 2014-03-18 ENCOUNTER — Telehealth: Payer: Self-pay | Admitting: Pulmonary Disease

## 2014-03-18 NOTE — Telephone Encounter (Signed)
Dg Chest 2 View  03/17/2014   CLINICAL DATA:  Short of breath.  Melanoma.  EXAM: CHEST  2 VIEW  COMPARISON:  11/08/2010  FINDINGS: Heart size upper normal. Vascularity normal. Lungs are clear without infiltrate or effusion. Surgical clips right axilla.  Negative for mass or lung nodule.  Port-A-Cath has been removed.  IMPRESSION: No active cardiopulmonary disease.   Electronically Signed   By: Franchot Gallo M.D.   On: 03/17/2014 13:37   Will have my nurse inform pt that CXR was normal.

## 2014-03-18 NOTE — Progress Notes (Signed)
Hematology and Oncology Follow Up Visit  [name] [id number] Apr 16, 1946 68 y.o. 03/18/2014   Principle Diagnosis:  Stage IIIC (T3b N3 M0) melanoma of the right shoulder.  Current Therapy:    Observation     Interim History:  Ms.  [name] is back for followup. We see her every 6 months. [name] been doing okay. She did not have any neck surgery. She was not felt to be in need of this. [name] getting some physical therapy.  She still has some sleep apnea issues. She does have CPAP at home.  She has had no problems with infections. His been no cough. [name] had no nausea vomiting.  There was a "scare" with a mammogram. Everything turned out okay however.  He's had no headache. [name] had no leg swelling. [name] had no change in medications.  Medications: Current outpatient prescriptions:Acetaminophen (TYLENOL ARTHRITIS EXT RELIEF PO), Take 1 tablet by mouth daily as needed., Disp: , Rfl: ;  albuterol (PROAIR HFA) 108 (90 BASE) MCG/ACT inhaler, Inhale 2 puffs into the lungs every 6 (six) hours as needed for wheezing or shortness of breath., Disp: 1 Inhaler, Rfl: 6;  aspirin 162 MG EC tablet, Take 162 mg by mouth daily., Disp: , Rfl:  KLOR-CON M10 10 MEQ tablet, TAKE 2 TABLETS BY MOUTH DAILY., Disp: 90 tablet, Rfl: 3;  loratadine (CLARITIN) 10 MG tablet, Take 10 mg by mouth daily as needed.  , Disp: , Rfl: ;  losartan-hydrochlorothiazide (HYZAAR) 50-12.5 MG per tablet, Take 1 tablet by mouth daily., Disp: 30 tablet, Rfl: 6;  montelukast (SINGULAIR) 10 MG tablet, TAKE 1 TABLET BY MOUTH DAILY, Disp: 30 tablet, Rfl: 11 Multiple Vitamins-Minerals (MULTIVITAMIN PO), Take by mouth every morning., Disp: , Rfl: ;  Probiotic Product (PROBIOTIC DAILY PO), Take by mouth every morning., Disp: , Rfl: ;  Vitamin D, Ergocalciferol, (DRISDOL) 50000 UNITS CAPS capsule, TAKE ONE CAPSULE BY MOUTH WEEKLY, Disp: 4 capsule, Rfl: 5;  zoster vaccine live, PF, (ZOSTAVAX) 32671 UNT/0.65ML injection, Inject 19,400 Units into  the skin once., Disp: 1 each, Rfl: 0  Allergies:  Allergies  Allergen Reactions  . Sulfa Drugs Cross Reactors Swelling    Sulfa medications-swelling of face    Past Medical History, Surgical history, Social history, and Family History were reviewed and updated.  Review of Systems: As above  Physical Exam:  height is 5\' 3"  (1.6 m) and weight is 218 lb (98.884 kg). Her oral temperature is 98.1 F (36.7 C). Her blood pressure is 124/69 and her pulse is 72. Her respiration is 18.   Portable and well-nourished white female. Head and neck exam is no ocular or oral lesions. There are no palpable cervical or supraclavicular lymph nodes. Lungs are clear. Cardiac exam regular rate and rhythm with no murmurs rubs or bruits. Abdomen soft. Has good bowel sounds. Is no fluid wave. There is a palpable hepato- splenomegaly. Exam shows a wide local excision scar in the posterior right shoulder region. This well-healed. No tenderness over the spine ribs or hips. Axillar exam shows no bilateral axillary adenopathy. Extremities shows no clubbing cyanosis or edema. There is mild lymphedema noted in the right arm. Skin exam shows no suspicious skin lesions.  Lab Results  Component Value Date   WBC 7.1 03/17/2014   HGB 13.2 03/17/2014   HCT 40.0 03/17/2014   MCV 93 03/17/2014   PLT 199 03/17/2014     Chemistry      Component Value Date/Time   NA 144 03/17/2014 1423  NA 143 04/29/2013 0956   NA 146 10/21/2012   K 4.3 03/17/2014 1423   K 3.7 06/24/2013 0957   CL 104 03/17/2014 1423   CL 105 04/29/2013 0956   CO2 29 03/17/2014 1423   CO2 29 04/29/2013 0956   BUN 13 03/17/2014 1423   BUN 16 04/29/2013 0956   BUN 18 10/21/2012   CREATININE 0.7 03/17/2014 1423   CREATININE 0.73 04/29/2013 0956      Component Value Date/Time   CALCIUM 9.6 03/17/2014 1423   CALCIUM 9.4 04/29/2013 0956   ALKPHOS 48 03/17/2014 1423   ALKPHOS 55 04/29/2013 0956   AST 19 03/17/2014 1423   AST 12 04/29/2013 0956   ALT 23 03/17/2014 1423    ALT 16 04/29/2013 0956   BILITOT 0.50 03/17/2014 1423   BILITOT 0.4 04/29/2013 0956         Impression and Plan: [name] is a 68 year old white female with stage IIIc melanoma of the right shoulder. She did undergo resection. She had 8 positive lymph nodes. She completed adjuvant interferon therapy in December of 2012. She had radiation therapy to the right axilla.  I see no evidence of recurrence. However, she is at significant risk for recurrence.  I don't see any need for special studies right now.  We will plan to get her back in another 6 months.   Amber Napoleon, MD 5/14/20156:56 AM

## 2014-03-18 NOTE — Telephone Encounter (Signed)
Pr returning call.[name] ° °

## 2014-03-18 NOTE — Telephone Encounter (Signed)
lmtcb x1 

## 2014-03-18 NOTE — Telephone Encounter (Signed)
I spoke with patient about results and she verbalized understanding and had no questions 

## 2014-03-19 ENCOUNTER — Encounter: Payer: Self-pay | Admitting: Pulmonary Disease

## 2014-03-19 NOTE — Assessment & Plan Note (Signed)
She is to continue claritin, and singulair.  May need further allergy evaluation if her symptoms do not improve.

## 2014-03-19 NOTE — Assessment & Plan Note (Signed)
Will get copy of her CPAP download, and then determine if some of her nocturnal issues may be related to sleep apnea.

## 2014-03-19 NOTE — Assessment & Plan Note (Signed)
Her symptoms of dyspnea are likely related to asthma exacerbated by allergens.    To further assess will arrange for CXR and PFT.  She is to continue singulair and prn albuterol for now.

## 2014-04-10 ENCOUNTER — Other Ambulatory Visit: Payer: Self-pay | Admitting: Family Medicine

## 2014-04-20 ENCOUNTER — Telehealth: Payer: Self-pay | Admitting: Pulmonary Disease

## 2014-04-20 NOTE — Telephone Encounter (Signed)
lmtcb X1 

## 2014-04-20 NOTE — Telephone Encounter (Signed)
CPAP 11/05/13 to 03/24/14 >> used on 140 of 140 nights with average 4 hrs and 36 min.  Average AHI is 6.5 with median CPAP 12 cm H2O.  Will have my nurse inform pt that CPAP settings are okay.  She will need to discuss with Dr. Elsworth Soho in more detail at her ROV on 04/28/14.  Will route message to Dr. Elsworth Soho also.

## 2014-04-21 NOTE — Telephone Encounter (Signed)
Spoke with pt, she is aware of results.  Verified her rove with RA on 6/24.  Nothing further needed.

## 2014-04-28 ENCOUNTER — Ambulatory Visit (INDEPENDENT_AMBULATORY_CARE_PROVIDER_SITE_OTHER): Payer: Medicare Other | Admitting: Pulmonary Disease

## 2014-04-28 ENCOUNTER — Encounter: Payer: Self-pay | Admitting: Pulmonary Disease

## 2014-04-28 VITALS — BP 130/74 | HR 74 | Temp 98.2°F | Ht 62.0 in | Wt 220.0 lb

## 2014-04-28 DIAGNOSIS — G4733 Obstructive sleep apnea (adult) (pediatric): Secondary | ICD-10-CM | POA: Diagnosis not present

## 2014-04-28 DIAGNOSIS — J4520 Mild intermittent asthma, uncomplicated: Secondary | ICD-10-CM

## 2014-04-28 DIAGNOSIS — R0609 Other forms of dyspnea: Secondary | ICD-10-CM

## 2014-04-28 DIAGNOSIS — R0600 Dyspnea, unspecified: Secondary | ICD-10-CM

## 2014-04-28 DIAGNOSIS — J45909 Unspecified asthma, uncomplicated: Secondary | ICD-10-CM

## 2014-04-28 DIAGNOSIS — R0989 Other specified symptoms and signs involving the circulatory and respiratory systems: Secondary | ICD-10-CM

## 2014-04-28 NOTE — Progress Notes (Signed)
   Subjective:    Patient ID: [name], female    DOB: 04-01-1946, 68 y.o.   MRN: [id number]  HPI   68 year old retired woman for followup of severe OSA Severe obstructive sleep apnea was diagnosed by sleep study in 2003 which showed AHI of 81 events per hour and lowest desaturation to 57%. This was corrected by CPAP of 12 cm in with residual events of 5.6 events per hour on a study in 2007. Nocturnal oximetry in 2014 happened he did not show any desaturations.  Her sleep pattern was severely disrupted due to treatment for melanoma from 2011-2013 requiring interferon therapy. She was also diagnosed with restless legs syndrome, which responded to Neurontin, she is hesitant to take narcotics for this. She has Cervical disc disease and has required Flexeril for muscle spasms.  She has regained the 50 pounds that she lost during cancer treatment    Pt is wearing CPAP nightly x 5.5 hrs a night. Pt was taking neurotin for RLS for years and then it became ineffective. She was taking flexeril for spasms in her neck and it helped also with RLS.  Chief Complaint  Patient presents with  . Follow-up    With pft results from today. Pt states she has had mild increase in dyspnea d/t seasonal allergies. Pt states she is wearing CPAP nightly 4-6 hours/night. Pt denies issues with machine and pressure. Pt states her nasal pillows are constantly shifting during the night.    Acute OV 03/24/14 for wheezing/ dyspnea - treated as allergies CPAP 11/05/13 to 03/24/14 >> used on 140 of 140 nights with average 4 hrs and 36 [name]. Average AHI is 6.5 with median CPAP 12 cm H2O.  CXR clear PFTs  - no airway obs, ratio ok, FEV1 80%, TLC & DLCO nml Moved from [place] 5 y ago` - she was diagnosed with asthma in her 42s and took Advair for a long period of time.  Review of Systems neg for any significant sore throat, dysphagia, itching, sneezing, nasal congestion or excess/ purulent secretions, fever, chills, sweats,  unintended wt loss, pleuritic or exertional cp, hempoptysis, orthopnea pnd or change in chronic leg swelling. Also denies presyncope, palpitations, heartburn, abdominal pain, nausea, vomiting, diarrhea or change in bowel or urinary habits, dysuria,hematuria, rash, arthralgias, visual complaints, headache, numbness weakness or ataxia.     Objective:   Physical Exam  Gen. Pleasant, obese, in no distress ENT - no lesions, no post nasal drip Neck: No JVD, no thyromegaly, no carotid bruits Lungs: no use of accessory muscles, no dullness to percussion, decreased without rales or rhonchi  Cardiovascular: Rhythm regular, heart sounds  normal, no murmurs or gallops, no peripheral edema Musculoskeletal: No deformities, no cyanosis or clubbing , no tremors       Assessment & Plan:

## 2014-04-28 NOTE — Patient Instructions (Signed)
Lung function appears ok We discussed reflux precautions If asthma symptoms worse, we can consider reflux therapy Albuterol as needed

## 2014-04-28 NOTE — Progress Notes (Signed)
PFT done today. 

## 2014-04-30 NOTE — Assessment & Plan Note (Signed)
Weight loss encouraged, compliance with goal of at least 4-6 hrs every night is the expectation. Advised against medications with sedative side effects Cautioned against driving when sleepy - understanding that sleepiness will vary on a day to day basis  

## 2014-04-30 NOTE — Assessment & Plan Note (Signed)
Lung function appears ok We discussed reflux precautions If asthma symptoms worse, we can consider reflux therapy Albuterol as needed

## 2014-06-01 LAB — PULMONARY FUNCTION TEST
DL/VA % pred: 108 %
DL/VA: 4.92 ml/min/mmHg/L
DLCO unc % pred: 85 %
DLCO unc: 18.48 ml/min/mmHg
FEF 25-75 Post: 1.37 L/sec
FEF 25-75 Pre: 1.24 L/sec
FEF2575-%Change-Post: 10 %
FEF2575-%Pred-Post: 72 %
FEF2575-%Pred-Pre: 65 %
FEV1-%Change-Post: 1 %
FEV1-%Pred-Post: 80 %
FEV1-%Pred-Pre: 79 %
FEV1-Post: 1.73 L
FEV1-Pre: 1.7 L
FEV1FVC-%Change-Post: 3 %
FEV1FVC-%Pred-Pre: 98 %
FEV6-%Change-Post: -2 %
FEV6-%Pred-Post: 81 %
FEV6-%Pred-Pre: 83 %
FEV6-Post: 2.22 L
FEV6-Pre: 2.26 L
FEV6FVC-%Change-Post: 0 %
FEV6FVC-%Pred-Post: 104 %
FEV6FVC-%Pred-Pre: 104 %
FVC-%Change-Post: -2 %
FVC-%Pred-Post: 78 %
FVC-%Pred-Pre: 79 %
FVC-Post: 2.22 L
FVC-Pre: 2.26 L
Post FEV1/FVC ratio: 78 %
Post FEV6/FVC ratio: 100 %
Pre FEV1/FVC ratio: 75 %
Pre FEV6/FVC Ratio: 100 %
RV % pred: 136 %
RV: 2.79 L
TLC % pred: 106 %
TLC: 5.07 L

## 2014-06-10 ENCOUNTER — Other Ambulatory Visit: Payer: Self-pay | Admitting: Family Medicine

## 2014-07-26 ENCOUNTER — Other Ambulatory Visit: Payer: Self-pay | Admitting: Family Medicine

## 2014-08-12 ENCOUNTER — Ambulatory Visit (INDEPENDENT_AMBULATORY_CARE_PROVIDER_SITE_OTHER): Payer: Medicare Other | Admitting: Pulmonary Disease

## 2014-08-12 ENCOUNTER — Encounter: Payer: Self-pay | Admitting: Pulmonary Disease

## 2014-08-12 VITALS — BP 123/83 | HR 70 | Temp 98.1°F | Ht 63.0 in | Wt 215.8 lb

## 2014-08-12 DIAGNOSIS — J4520 Mild intermittent asthma, uncomplicated: Secondary | ICD-10-CM

## 2014-08-12 DIAGNOSIS — J209 Acute bronchitis, unspecified: Secondary | ICD-10-CM | POA: Diagnosis not present

## 2014-08-12 DIAGNOSIS — G4733 Obstructive sleep apnea (adult) (pediatric): Secondary | ICD-10-CM | POA: Diagnosis not present

## 2014-08-12 MED ORDER — AZITHROMYCIN 250 MG PO TABS: ORAL_TABLET | ORAL | Status: DC

## 2014-08-12 MED ORDER — PREDNISONE 10 MG PO TABS: ORAL_TABLET | ORAL | Status: DC

## 2014-08-12 MED ORDER — GUAIFENESIN-CODEINE 100-10 MG/5ML PO SYRP: 10.0000 mL | ORAL_SOLUTION | Freq: Three times a day (TID) | ORAL | Status: DC | PRN

## 2014-08-12 NOTE — Progress Notes (Signed)
   Subjective:    Patient ID: [name], female    DOB: July 24, 1946, 68 y.o.   MRN: [id number]  HPI  68 year old retired woman for followup of severe OSA   Moved from [place] 2010 - she was diagnosed with asthma in her 50s and took Advair for a long period of time.   Her sleep pattern was severely disrupted due to treatment for melanoma from 2011-2013 requiring interferon therapy. She was also diagnosed with restless legs syndrome, which responded to Neurontin, she is hesitant to take narcotics for this. She has Cervical disc disease and has required Flexeril for muscle spasms.  She took neurotin for RLS for years and then it became ineffective.  Acute OV 03/24/14 for wheezing/ dyspnea - treated as allergies   Significant tests/ events  PSG 2003 which showed AHI of 81 events per hour and lowest desaturation to 57%. This was corrected by CPAP of 12 cm in with residual events of 5.6 events per hour on a study in 2007. CPAP 11/05/13 to 03/24/14 >> used on 140 of 140 nights with average 4 hrs and 36 min. Average AHI is 6.5 with median CPAP 12 cm H2O.   PFTs 04/2014- no airway obs, ratio ok, FEV1 80%, TLC & DLCO nml    08/12/2014  Chief Complaint  Patient presents with  . Asthma    follow-up. Pt states she was doing well until 4 days ago and she began to have a dry cough, sore throat, fatigue, nasal congestion. Pt is wearing cpap every night.    URI - sore throat that progressed to head cold & cough with green sputum & occ wheeze. No fevers Compliant with cpap - nasal pillows  Review of Systems neg for any significant sore throat, dysphagia, itching, sneezing, nasal congestion or excess/ purulent secretions, fever, chills, sweats, unintended wt loss, pleuritic or exertional cp, hempoptysis, orthopnea pnd or change in chronic leg swelling. Also denies presyncope, palpitations, heartburn, abdominal pain, nausea, vomiting, diarrhea or change in bowel or urinary habits, dysuria,hematuria, rash,  arthralgias, visual complaints, headache, numbness weakness or ataxia.     Objective:   Physical Exam  Gen. Pleasant, obese, in no distress ENT - no lesions, no post nasal drip Neck: No JVD, no thyromegaly, no carotid bruits Lungs: no use of accessory muscles, no dullness to percussion, decreased with faint BL scattered rhonchi  Cardiovascular: Rhythm regular, heart sounds  normal, no murmurs or gallops, no peripheral edema Musculoskeletal: No deformities, no cyanosis or clubbing , no tremors       Assessment & Plan:

## 2014-08-12 NOTE — Assessment & Plan Note (Signed)
Weight loss encouraged, compliance with goal of at least 6 hrs every night is the expectation. Cautioned against driving when sleepy - understanding that sleepiness will vary on a day to day basis

## 2014-08-12 NOTE — Assessment & Plan Note (Signed)
Ct singulair

## 2014-08-12 NOTE — Assessment & Plan Note (Signed)
Zpak Prednisone 10 mg tabs  Take 2 tabs daily with food x 5ds, then 1 tab daily with food x 5ds then STOP cheratussin cough syrup 2 tsp thrice daily as needed

## 2014-08-12 NOTE — Patient Instructions (Signed)
Zpak Prednisone 10 mg tabs  Take 2 tabs daily with food x 5ds, then 1 tab daily with food x 5ds then STOP cheratussin cough syrup 2 tsp thrice daily as needed

## 2014-08-25 ENCOUNTER — Other Ambulatory Visit: Payer: Self-pay | Admitting: Family Medicine

## 2014-09-09 ENCOUNTER — Encounter: Payer: Medicare Other | Admitting: Family Medicine

## 2014-09-17 ENCOUNTER — Other Ambulatory Visit (HOSPITAL_BASED_OUTPATIENT_CLINIC_OR_DEPARTMENT_OTHER): Payer: Medicare Other | Admitting: Lab

## 2014-09-17 ENCOUNTER — Encounter: Payer: Self-pay | Admitting: Family

## 2014-09-17 ENCOUNTER — Ambulatory Visit (HOSPITAL_BASED_OUTPATIENT_CLINIC_OR_DEPARTMENT_OTHER): Payer: Medicare Other | Admitting: Family

## 2014-09-17 ENCOUNTER — Ambulatory Visit (HOSPITAL_BASED_OUTPATIENT_CLINIC_OR_DEPARTMENT_OTHER): Payer: Medicare Other

## 2014-09-17 VITALS — BP 131/70 | HR 69 | Temp 97.5°F | Resp 14 | Ht 63.0 in | Wt 217.0 lb

## 2014-09-17 DIAGNOSIS — C439 Malignant melanoma of skin, unspecified: Secondary | ICD-10-CM | POA: Diagnosis not present

## 2014-09-17 DIAGNOSIS — Z85820 Personal history of malignant melanoma of skin: Secondary | ICD-10-CM | POA: Diagnosis not present

## 2014-09-17 DIAGNOSIS — Z23 Encounter for immunization: Secondary | ICD-10-CM

## 2014-09-17 DIAGNOSIS — J4520 Mild intermittent asthma, uncomplicated: Secondary | ICD-10-CM

## 2014-09-17 LAB — CBC WITH DIFFERENTIAL (CANCER CENTER ONLY)
BASO#: 0 10*3/uL (ref 0.0–0.2)
BASO%: 0.2 % (ref 0.0–2.0)
EOS%: 3.8 % (ref 0.0–7.0)
Eosinophils Absolute: 0.2 10*3/uL (ref 0.0–0.5)
HCT: 40.1 % (ref 34.8–46.6)
HGB: 13.3 g/dL (ref 11.6–15.9)
LYMPH#: 1.7 10*3/uL (ref 0.9–3.3)
LYMPH%: 29.9 % (ref 14.0–48.0)
MCH: 31 pg (ref 26.0–34.0)
MCHC: 33.2 g/dL (ref 32.0–36.0)
MCV: 94 fL (ref 81–101)
MONO#: 0.5 10*3/uL (ref 0.1–0.9)
MONO%: 9 % (ref 0.0–13.0)
NEUT#: 3.3 10*3/uL (ref 1.5–6.5)
NEUT%: 57.1 % (ref 39.6–80.0)
Platelets: 187 10*3/uL (ref 145–400)
RBC: 4.29 10*6/uL (ref 3.70–5.32)
RDW: 12.7 % (ref 11.1–15.7)
WBC: 5.8 10*3/uL (ref 3.9–10.0)

## 2014-09-17 LAB — CMP (CANCER CENTER ONLY)
ALT(SGPT): 21 U/L (ref 10–47)
AST: 16 U/L (ref 11–38)
Albumin: 3.7 g/dL (ref 3.3–5.5)
Alkaline Phosphatase: 49 U/L (ref 26–84)
BUN, Bld: 21 mg/dL (ref 7–22)
CO2: 27 mEq/L (ref 18–33)
Calcium: 9.3 mg/dL (ref 8.0–10.3)
Chloride: 104 mEq/L (ref 98–108)
Creat: 1 mg/dl (ref 0.6–1.2)
Glucose, Bld: 94 mg/dL (ref 73–118)
Potassium: 3.8 mEq/L (ref 3.3–4.7)
Sodium: 147 mEq/L — ABNORMAL HIGH (ref 128–145)
Total Bilirubin: 0.6 mg/dl (ref 0.20–1.60)
Total Protein: 7.3 g/dL (ref 6.4–8.1)

## 2014-09-17 LAB — LACTATE DEHYDROGENASE: LDH: 163 U/L (ref 94–250)

## 2014-09-17 MED ORDER — INFLUENZA VAC SPLIT QUAD 0.5 ML IM SUSY
0.5000 mL | PREFILLED_SYRINGE | Freq: Once | INTRAMUSCULAR | Status: AC
  Administered 2014-09-17: 0.5 mL via INTRAMUSCULAR
  Filled 2014-09-17: qty 0.5

## 2014-09-17 NOTE — Progress Notes (Signed)
[facility]  [phone] [phone]  ID: [name] OB: 1946/07/15 MR#: [id number] Patient Care Team: [name], MD as PCP - General (Family Medicine)  DIAGNOSIS: Stage IIIC (T3b N3 M0) melanoma of the right shoulder  INTERVAL HISTORY: [name] is here today for a follow-up. We see her every 6 months. She is doing well. She recently had a couple basal cell spots removed, one from her right forehead and one from her right arm. She sees dermatology every 3 months. She denies fever, chills, n/v, cough, rash, headache, dizziness, SOB, chest pain, palpitations, abdominal pain, constipation, diarrhea, blood in urine or tool.  No swelling, tenderness, numbness or tingling in her extremities.  Her appetite is good and she is drinking plenty of fluids.Her weight is stable.  She is using her CPAP at home and this has help a lot with her being able to rest.  She has her annual gynecologic visit in December.   CURRENT TREATMENT: Observation  REVIEW OF SYSTEMS: All other 10 point review of systems is negative.   PAST MEDICAL HISTORY: Past Medical History  Diagnosis Date  . Hypertension   . DVT (deep venous thrombosis) 2011    small, developed after venous ablation  . Tinnitus   . Asthma in adult   . Chronic venous insufficiency   . Melanoma 06/29/10    MALIGNANT MELANOMA R SHOULDER/SUPRASCAPULAR BACK s/p interferon chemo and XRT  . History of radiation therapy 07/19/11-08/25/2011    RIGHT AXILLARY REGION/METASTATIC  . History of kidney stones 1980s  . Neuromuscular disorder     muscle spasms  . Arthritis   . Seasonal allergies   . OSA (obstructive sleep apnea)     on CPAP  . RLS (restless legs syndrome)   . Anesthesia complication     trouble waking up 2/2 CPAP  . Unspecified vitamin D deficiency 03/18/2013  . Hearing loss sensory, bilateral 2013    mod-severe high freq sensorineural ([facility])  . Cervical spondylosis  2012    [facility])    PAST SURGICAL HISTORY: Past Surgical History  Procedure Laterality Date  . Venous ablation  2010    left leg  . Lumbar laminectomy  2001    L4/5  . Deep axillary sentinel node biopsy / excision  2012    RIGHT  . Portacath placement      left subclavian  . Breast surgery Right 1990    BX   . Dilation and curettage of uterus  2010  . Port-a-cath removal  12/05/2011    Procedure: REMOVAL PORT-A-CATH;  Surgeon: Rolm Bookbinder, MD;  Location: [facility];  Service: General;  Laterality: N/A;  left port removal  . Breast biopsy Left 2013    BENIGN  . Knee surgery  1985    left  . Melanoma excision  2011    Right shoulder, with sentinel lymph node biopsy  . Dexa  12/2009    WNL  . Cardiovascular stress test  2010    normal stress test, EF 66%    FAMILY HISTORY Family History  Problem Relation Age of Onset  . Cancer Mother 24    uterine  . Cancer Cousin     x2, breast  . CAD Father 57    MI  . Hypertension Father 68  . Diabetes Sister   . Cancer Paternal Grandmother     melanoma, possibly  . Alzheimer's disease Father 34    GYNECOLOGIC HISTORY:  No LMP  recorded. Patient is postmenopausal.   SOCIAL HISTORY: History   Social History  . Marital Status: Divorced    Spouse Name: N/A    Number of Children: 0  . Years of Education: N/A   Occupational History  . retired     Government social research officer    Social History Main Topics  . Smoking status: Former Smoker    Types: Cigarettes    Quit date: 11/05/1965  . Smokeless tobacco: Never Used     Comment: socially as a teen  . Alcohol Use: Yes     Comment: drinks wine occasionally  . Drug Use: No  . Sexual Activity: Not on file   Other Topics Concern  . Not on file   Social History Narrative   Lives alone.  Sister and husband live next door   Lives in [place].   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   Edu: some college   Diet: poor - good water, rare  fruits/vegetables    ADVANCED DIRECTIVES:  <no information>  HEALTH MAINTENANCE: History  Substance Use Topics  . Smoking status: Former Smoker    Types: Cigarettes    Quit date: 11/05/1965  . Smokeless tobacco: Never Used     Comment: socially as a teen  . Alcohol Use: Yes     Comment: drinks wine occasionally   Colonoscopy: PAP: Bone density: Lipid panel:  Allergies  Allergen Reactions  . Sulfa Drugs Cross Reactors Swelling    Sulfa medications-swelling of face    Current Outpatient Prescriptions  Medication Sig Dispense Refill  . Acetaminophen (TYLENOL ARTHRITIS EXT RELIEF PO) Take 1 tablet by mouth daily as needed.    Marland Kitchen albuterol (PROAIR HFA) 108 (90 BASE) MCG/ACT inhaler Inhale 2 puffs into the lungs every 6 (six) hours as needed for wheezing or shortness of breath. 1 Inhaler 6  . aspirin 162 MG EC tablet Take 81 mg by mouth daily.     Marland Kitchen KLOR-CON M10 10 MEQ tablet TAKE 2 TABLETS BY MOUTH DAILY. 90 tablet 3  . loratadine (CLARITIN) 10 MG tablet Take 10 mg by mouth daily as needed.      Marland Kitchen losartan-hydrochlorothiazide (HYZAAR) 50-12.5 MG per tablet Take 1 tablet by mouth daily. 30 tablet 6  . montelukast (SINGULAIR) 10 MG tablet TAKE 1 TABLET BY MOUTH DAILY 30 tablet 11  . Multiple Vitamins-Minerals (MULTIVITAMIN PO) Take by mouth every morning.    . Probiotic Product (PROBIOTIC DAILY PO) Take by mouth every morning.    . Vitamin D, Ergocalciferol, (DRISDOL) 50000 UNITS CAPS capsule TAKE ONE CAPSULE BY MOUTH WEEKLY 4 capsule 5   No current facility-administered medications for this visit.    OBJECTIVE: Filed Vitals:   09/17/14 1030  BP: 131/70  Pulse: 69  Temp: 97.5 F (36.4 C)  Resp: 14    Filed Weights   09/17/14 1030  Weight: 217 lb (98.431 kg)   ECOG FS:0 - Asymptomatic Ocular: Sclerae unicteric, pupils equal, round and reactive to light Ear-nose-throat: Oropharynx clear, dentition fair Lymphatic: No cervical or supraclavicular adenopathy Lungs no  rales or rhonchi, good excursion bilaterally Heart regular rate and rhythm, no murmur appreciated Abd soft, nontender, positive bowel sounds MSK no focal spinal tenderness, no joint edema Neuro: non-focal, well-oriented, appropriate affect Breasts: Deferred  LAB RESULTS: CMP     Component Value Date/Time   NA 144 03/17/2014 1423   NA 143 04/29/2013 0956   NA 146 10/21/2012   K 4.3 03/17/2014 1423  K 3.7 06/24/2013 0957   CL 104 03/17/2014 1423   CL 105 04/29/2013 0956   CO2 29 03/17/2014 1423   CO2 29 04/29/2013 0956   GLUCOSE 98 03/17/2014 1423   GLUCOSE 92 04/29/2013 0956   BUN 13 03/17/2014 1423   BUN 16 04/29/2013 0956   BUN 18 10/21/2012   CREATININE 0.7 03/17/2014 1423   CREATININE 0.73 04/29/2013 0956   CALCIUM 9.6 03/17/2014 1423   CALCIUM 9.4 04/29/2013 0956   PROT 7.2 03/17/2014 1423   PROT 6.7 04/29/2013 0956   ALBUMIN 3.7 04/29/2013 0956   AST 19 03/17/2014 1423   AST 12 04/29/2013 0956   ALT 23 03/17/2014 1423   ALT 16 04/29/2013 0956   ALKPHOS 48 03/17/2014 1423   ALKPHOS 55 04/29/2013 0956   BILITOT 0.50 03/17/2014 1423   BILITOT 0.4 04/29/2013 0956   GFRNONAA >60 11/07/2010 1316   GFRAA  11/07/2010 1316    >60        The eGFR has been calculated using the MDRD equation. This calculation has not been validated in all clinical situations. eGFR's persistently <60 mL/min signify possible Chronic Kidney Disease.   INo results found for: SPEP, UPEP Lab Results  Component Value Date   WBC 5.8 09/17/2014   NEUTROABS 3.3 09/17/2014   HGB 13.3 09/17/2014   HCT 40.1 09/17/2014   MCV 94 09/17/2014   PLT 187 09/17/2014   No results found for: LABCA2 No components found for: [id number] No results for input(s): INR in the last 168 hours. Urinalysis No results found for: COLORURINE, APPEARANCEUR, LABSPEC, PHURINE, GLUCOSEU, HGBUR, BILIRUBINUR, KETONESUR, PROTEINUR, UROBILINOGEN, NITRITE, LEUKOCYTESUR STUDIES: No results found.  ASSESSMENT/PLAN: Ms.  Burrough is a 68 year old white female with history of stage IIIc melanoma of the right shoulder. She did undergo resection. She had 8 positive lymph nodes. She completed adjuvant interferon therapy in December of 2012. She had radiation therapy to the right axilla. She has had no evidence of recurrence. She is doing well at this time. Her CBC today was normal. She got her flu vaccine today. We will see her back in 6 months for labs and follow-up.  She knows to call here with any questions or concerns and to go to the ED in the event of an emergency. We can certainly see her sooner if need be.  Eliezer Bottom, NP 09/17/2014 11:17 AM

## 2014-09-17 NOTE — Patient Instructions (Signed)
Influenza Virus Vaccine injection (Fluarix) What is this medicine? INFLUENZA VIRUS VACCINE (in floo EN zuh VAHY ruhs vak SEEN) helps to reduce the risk of getting influenza also known as the flu. This medicine may be used for other purposes; ask your health care provider or pharmacist if you have questions. COMMON BRAND NAME(S): Fluarix, Fluzone What should I tell my health care provider before I take this medicine? They need to know if you have any of these conditions: -bleeding disorder like hemophilia -fever or infection -Guillain-Barre syndrome or other neurological problems -immune system problems -infection with the human immunodeficiency virus (HIV) or AIDS -low blood platelet counts -multiple sclerosis -an unusual or allergic reaction to influenza virus vaccine, eggs, chicken proteins, latex, gentamicin, other medicines, foods, dyes or preservatives -pregnant or trying to get pregnant -breast-feeding How should I use this medicine? This vaccine is for injection into a muscle. It is given by a health care professional. A copy of Vaccine Information Statements will be given before each vaccination. Read this sheet carefully each time. The sheet may change frequently. Talk to your pediatrician regarding the use of this medicine in children. Special care may be needed. Overdosage: If you think you have taken too much of this medicine contact a poison control center or emergency room at once. NOTE: This medicine is only for you. Do not share this medicine with others. What if I miss a dose? This does not apply. What may interact with this medicine? -chemotherapy or radiation therapy -medicines that lower your immune system like etanercept, anakinra, infliximab, and adalimumab -medicines that treat or prevent blood clots like warfarin -phenytoin -steroid medicines like prednisone or cortisone -theophylline -vaccines This list may not describe all possible interactions. Give your  health care provider a list of all the medicines, herbs, non-prescription drugs, or dietary supplements you use. Also tell them if you smoke, drink alcohol, or use illegal drugs. Some items may interact with your medicine. What should I watch for while using this medicine? Report any side effects that do not go away within 3 days to your doctor or health care professional. Call your health care provider if any unusual symptoms occur within 6 weeks of receiving this vaccine. You may still catch the flu, but the illness is not usually as bad. You cannot get the flu from the vaccine. The vaccine will not protect against colds or other illnesses that may cause fever. The vaccine is needed every year. What side effects may I notice from receiving this medicine? Side effects that you should report to your doctor or health care professional as soon as possible: -allergic reactions like skin rash, itching or hives, swelling of the face, lips, or tongue Side effects that usually do not require medical attention (report to your doctor or health care professional if they continue or are bothersome): -fever -headache -muscle aches and pains -pain, tenderness, redness, or swelling at site where injected -weak or tired This list may not describe all possible side effects. Call your doctor for medical advice about side effects. You may report side effects to FDA at 1-800-FDA-1088. Where should I keep my medicine? This vaccine is only given in a clinic, pharmacy, doctor's office, or other health care setting and will not be stored at home. NOTE: This sheet is a summary. It may not cover all possible information. If you have questions about this medicine, talk to your doctor, pharmacist, or health care provider.  2015, Elsevier/Gold Standard. (2008-05-19 09:30:40)  

## 2014-09-20 ENCOUNTER — Telehealth: Payer: Self-pay | Admitting: *Deleted

## 2014-09-20 NOTE — Telephone Encounter (Addendum)
 -----   Message from [name], MD sent at 09/17/2014  5:51 PM EST ----- Call - labs look ok!!  Tell her Merry Christmas!!  Laurey Arrow

## 2014-09-22 ENCOUNTER — Encounter: Payer: Self-pay | Admitting: Family Medicine

## 2014-09-22 ENCOUNTER — Ambulatory Visit (INDEPENDENT_AMBULATORY_CARE_PROVIDER_SITE_OTHER): Payer: Medicare Other | Admitting: Family Medicine

## 2014-09-22 VITALS — BP 118/82 | HR 64 | Temp 97.9°F | Ht 62.2 in | Wt 215.5 lb

## 2014-09-22 DIAGNOSIS — Z23 Encounter for immunization: Secondary | ICD-10-CM

## 2014-09-22 DIAGNOSIS — I10 Essential (primary) hypertension: Secondary | ICD-10-CM | POA: Diagnosis not present

## 2014-09-22 DIAGNOSIS — Z0000 Encounter for general adult medical examination without abnormal findings: Secondary | ICD-10-CM

## 2014-09-22 DIAGNOSIS — G4733 Obstructive sleep apnea (adult) (pediatric): Secondary | ICD-10-CM

## 2014-09-22 DIAGNOSIS — C439 Malignant melanoma of skin, unspecified: Secondary | ICD-10-CM

## 2014-09-22 DIAGNOSIS — Z7189 Other specified counseling: Secondary | ICD-10-CM | POA: Insufficient documentation

## 2014-09-22 LAB — T3: T3, Total: 96.9 ng/dL (ref 80.0–204.0)

## 2014-09-22 LAB — T4, FREE: Free T4: 0.85 ng/dL (ref 0.60–1.60)

## 2014-09-22 LAB — TSH: TSH: 2.16 u[IU]/mL (ref 0.35–4.50)

## 2014-09-22 NOTE — Addendum Note (Signed)
Addended by: Royann Shivers A on: 09/22/2014 11:35 AM   Modules accepted: Orders

## 2014-09-22 NOTE — Assessment & Plan Note (Signed)
Chronic, stable. Continue regimen. 

## 2014-09-22 NOTE — Assessment & Plan Note (Signed)
Continue CPAP. Check TSH.

## 2014-09-22 NOTE — Assessment & Plan Note (Signed)
I have personally reviewed the Medicare Annual Wellness questionnaire and have noted 1. The patient's medical and social history 2. Their use of alcohol, tobacco or illicit drugs 3. Their current medications and supplements 4. The patient's functional ability including ADL's, fall risks, home safety risks and hearing or visual impairment. 5. Diet and physical activity 6. Evidence for depression or mood disorders The patients weight, height, BMI have been recorded in the chart.  Hearing and vision has been addressed. I have made referrals, counseling and provided education to the patient based review of the above and I have provided the pt with a written personalized care plan for preventive services. Provider list updated - see scanned questionairre.  Reviewed preventative protocols and updated unless pt declined. 

## 2014-09-22 NOTE — Addendum Note (Signed)
Addended by: Daralene Milch C on: 09/22/2014 04:09 PM   Modules accepted: Miquel Dunn

## 2014-09-22 NOTE — Patient Instructions (Addendum)
Prevnar today. Advanced directive packet provided today. Blood work today. Good to see you today, call us with questions. Return as needed or in 1 year for next wellness visit.

## 2014-09-22 NOTE — Assessment & Plan Note (Signed)
Advanced directives: doesn't want prolonged life support. unsure who would be HCPOA. Packet provided today.

## 2014-09-22 NOTE — Assessment & Plan Note (Signed)
Regularly followed by derm and onc

## 2014-09-22 NOTE — Progress Notes (Signed)
Pre visit review using our clinic review tool, if applicable. No additional management support is needed unless otherwise documented below in the visit note. 

## 2014-09-22 NOTE — Progress Notes (Signed)
BP 118/82 mmHg  Pulse 64  Temp(Src) 97.9 F (36.6 C) (Oral)  Ht 5' 2.2" (1.58 m)  Wt 215 lb 8 oz (97.75 kg)  BMI 39.16 kg/m2   CC: medicare wellness visit  Subjective:    Patient ID: [name], female    DOB: 04-[name]-1946, 68 y.o.   MRN: [id number]  HPI: [name] is a 67 y.o. female presenting on 09/22/2014 for Annual Exam   R shoulder melanoma - followed by oncology Q6 mo.  OSA on CPAP - but notes persistent daytime somnolence mainly when reading in a chair. Has had normal nocturnal oximeter per pulm.   Hearing and vision screens passed today.H/o tinnitus. Failed L eye - due to see eye doctor. Known cataract. Denies falls, depression,anhedonia.   Preventative: Colonoscopy 2007 per pt - no records of this.Told good for 10 years. Declines stool kit today. Well woman exam with OBGYN (Dr. [name]) sees yearly. Mammogram 10/2013 to get repeat next month. DEXA Date: 12/2009 WNL Tetanus unsure Pneumovax 05/2013 , prevnar today Flu shot - last week zostavax - did not do - too expensive. Advanced directives: doesn't want prolonged life support. unsure who would be HCPOA. Packet provided today.  Lives alone. Sister and husband live next door  Lives in [place].  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  Edu: some college Diet: poor - good water, rare fruits/vegetables  Relevant past medical, surgical, family and social history reviewed and updated as indicated.  Allergies and medications reviewed and updated. Current Outpatient Prescriptions on File Prior to Visit  Medication Sig  . Acetaminophen (TYLENOL ARTHRITIS EXT RELIEF PO) Take 1 tablet by mouth daily as needed.  Marland Kitchen albuterol (PROAIR HFA) 108 (90 BASE) MCG/ACT inhaler Inhale 2 puffs into the lungs every 6 (six) hours as needed for wheezing or shortness of breath.  Marland Kitchen KLOR-CON M10 10 MEQ tablet TAKE 2 TABLETS BY MOUTH DAILY.  Marland Kitchen loratadine (CLARITIN) 10 MG tablet Take 10 mg by mouth daily as  needed.    Marland Kitchen losartan-hydrochlorothiazide (HYZAAR) 50-12.5 MG per tablet Take 1 tablet by mouth daily.  . montelukast (SINGULAIR) 10 MG tablet TAKE 1 TABLET BY MOUTH DAILY  . Multiple Vitamins-Minerals (MULTIVITAMIN PO) Take by mouth every morning.  . Probiotic Product (PROBIOTIC DAILY PO) Take by mouth every morning.  . Vitamin D, Ergocalciferol, (DRISDOL) 50000 UNITS CAPS capsule TAKE ONE CAPSULE BY MOUTH WEEKLY   No current facility-administered medications on file prior to visit.    Review of Systems Per HPI unless specifically indicated above    Objective:    BP 118/82 mmHg  Pulse 64  Temp(Src) 97.9 F (36.6 C) (Oral)  Ht 5' 2.2" (1.58 m)  Wt 215 lb 8 oz (97.75 kg)  BMI 39.16 kg/m2  Physical Exam  Constitutional: She is oriented to person, place, and time. She appears well-developed and well-nourished. No distress.  HENT:  Head: Normocephalic and atraumatic.  Right Ear: Hearing, tympanic membrane, external ear and ear canal normal.  Left Ear: Hearing, tympanic membrane, external ear and ear canal normal.  Nose: Nose normal.  Mouth/Throat: Uvula is midline, oropharynx is clear and moist and mucous membranes are normal. No oropharyngeal exudate, posterior oropharyngeal edema or posterior oropharyngeal erythema.  Eyes: Conjunctivae and EOM are normal. Pupils are equal, round, and reactive to light. No scleral icterus.  Neck: Normal range of motion. Neck supple. Carotid bruit is not present. No thyromegaly present.  Cardiovascular: Normal rate, regular rhythm, normal heart  sounds and intact distal pulses.   No murmur heard. Pulses:      Radial pulses are 2+ on the right side, and 2+ on the left side.  Pulmonary/Chest: Effort normal and breath sounds normal. No respiratory distress. She has no wheezes. She has no rales.  Abdominal: Soft. Bowel sounds are normal. She exhibits no distension and no mass. There is no tenderness. There is no rebound and no guarding.  Musculoskeletal:  Normal range of motion. She exhibits no edema.  Kyphosis present  Lymphadenopathy:    She has no cervical adenopathy.  Neurological: She is alert and oriented to person, place, and time.  CN grossly intact, station and gait intact Recall 3/3  Calculation 5/5 D-L-R-O-W  Skin: Skin is warm and dry. No rash noted.  Psychiatric: She has a normal mood and affect. Her behavior is normal. Judgment and thought content normal.  Nursing note and vitals reviewed.  Results for orders placed or performed in visit on 09/17/14  CBC with Differential [facility])  Result Value Ref Range   WBC 5.8 3.9 - 10.0 10e3/uL   RBC 4.29 3.70 - 5.32 10e6/uL   HGB 13.3 11.6 - 15.9 g/dL   HCT 40.1 34.8 - 46.6 %   MCV 94 81 - 101 fL   MCH 31.0 26.0 - 34.0 pg   MCHC 33.2 32.0 - 36.0 g/dL   RDW 12.7 11.1 - 15.7 %   Platelets 187 145 - 400 10e3/uL   NEUT# 3.3 1.5 - 6.5 10e3/uL   LYMPH# 1.7 0.9 - 3.3 10e3/uL   MONO# 0.5 0.1 - 0.9 10e3/uL   Eosinophils Absolute 0.2 0.0 - 0.5 10e3/uL   BASO# 0.0 0.0 - 0.2 10e3/uL   NEUT% 57.1 39.6 - 80.0 %   LYMPH% 29.9 14.0 - 48.0 %   MONO% 9.0 0.0 - 13.0 %   EOS% 3.8 0.0 - 7.0 %   BASO% 0.2 0.0 - 2.0 %  Lactate dehydrogenase  Result Value Ref Range   LDH 163 94 - 250 U/L  COMPREHENSIVE METABOLIC PANEL ([facility] REFLEX ONLY)  Result Value Ref Range   Sodium 147 (H) 128 - 145 mEq/L   Potassium 3.8 3.3 - 4.7 mEq/L   Chloride 104 98 - 108 mEq/L   CO2 27 18 - 33 mEq/L   Glucose, Bld 94 73 - 118 mg/dL   BUN, Bld 21 7 - 22 mg/dL   Creat 1.0 0.6 - 1.2 mg/dl   Total Bilirubin 0.60 0.20 - 1.60 mg/dl   Alkaline Phosphatase 49 26 - 84 U/L   AST 16 11 - 38 U/L   ALT(SGPT) 21 10 - 47 U/L   Total Protein 7.3 6.4 - 8.1 g/dL   Albumin 3.7 3.3 - 5.5 g/dL   Calcium 9.3 8.0 - 10.3 mg/dL      Assessment & Plan:   Problem List Items Addressed This Visit    OSA (obstructive sleep apnea)    Continue CPAP. Check TSH.    Melanoma    Regularly followed by derm and onc     Relevant Medications      aspirin 81 MG tablet   Medicare annual wellness visit, subsequent - Primary    I have personally reviewed the Medicare Annual Wellness questionnaire and have noted 1. The patient's medical and social history 2. Their use of alcohol, tobacco or illicit drugs 3. Their current medications and supplements 4. The patient's functional ability including ADL's, fall risks, home safety risks and hearing or visual impairment.  5. Diet and physical activity 6. Evidence for depression or mood disorders The patients weight, height, BMI have been recorded in the chart.  Hearing and vision has been addressed. I have made referrals, counseling and provided education to the patient based review of the above and I have provided the pt with a written personalized care plan for preventive services. Provider list updated - see scanned questionairre.  Reviewed preventative protocols and updated unless pt declined.     Hypertension    Chronic, stable. Continue regimen.    Relevant Medications      aspirin 81 MG tablet   Other Relevant Orders      TSH      T4, free      T3   Advanced care planning/counseling discussion    Advanced directives: doesn't want prolonged life support. unsure who would be HCPOA. Packet provided today.        Follow up plan: Return in about 1 year (around 09/23/2015), or as needed, for annual exam, prior fasting for blood work.

## 2014-09-23 ENCOUNTER — Encounter: Payer: Self-pay | Admitting: *Deleted

## 2014-09-26 ENCOUNTER — Other Ambulatory Visit: Payer: Self-pay | Admitting: Family Medicine

## 2014-09-29 ENCOUNTER — Other Ambulatory Visit: Payer: Self-pay | Admitting: Family Medicine

## 2014-10-01 ENCOUNTER — Other Ambulatory Visit: Payer: Self-pay | Admitting: Family Medicine

## 2014-10-03 ENCOUNTER — Encounter: Payer: Self-pay | Admitting: Family Medicine

## 2014-10-05 DIAGNOSIS — Z01419 Encounter for gynecological examination (general) (routine) without abnormal findings: Secondary | ICD-10-CM | POA: Diagnosis not present

## 2014-11-02 DIAGNOSIS — Z85820 Personal history of malignant melanoma of skin: Secondary | ICD-10-CM | POA: Diagnosis not present

## 2014-11-02 DIAGNOSIS — Z85828 Personal history of other malignant neoplasm of skin: Secondary | ICD-10-CM | POA: Diagnosis not present

## 2014-11-02 DIAGNOSIS — L98491 Non-pressure chronic ulcer of skin of other sites limited to breakdown of skin: Secondary | ICD-10-CM | POA: Diagnosis not present

## 2014-11-04 ENCOUNTER — Encounter: Payer: Self-pay | Admitting: Family Medicine

## 2014-11-04 ENCOUNTER — Ambulatory Visit (INDEPENDENT_AMBULATORY_CARE_PROVIDER_SITE_OTHER): Payer: Medicare Other | Admitting: Family Medicine

## 2014-11-04 VITALS — BP 154/86 | HR 68 | Temp 98.0°F | Wt 220.5 lb

## 2014-11-04 DIAGNOSIS — I83228 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 of other part of lower extremity and inflammation: Secondary | ICD-10-CM

## 2014-11-04 DIAGNOSIS — I83224 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 of heel and midfoot and inflammation: Secondary | ICD-10-CM

## 2014-11-04 DIAGNOSIS — I83223 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 of ankle and inflammation: Secondary | ICD-10-CM

## 2014-11-04 DIAGNOSIS — I83221 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 of thigh and inflammation: Secondary | ICD-10-CM | POA: Diagnosis not present

## 2014-11-04 DIAGNOSIS — I83023 Varicose veins of left lower extremity with ulcer of ankle: Secondary | ICD-10-CM | POA: Insufficient documentation

## 2014-11-04 DIAGNOSIS — I83225 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 other part of foot and inflammation: Secondary | ICD-10-CM

## 2014-11-04 DIAGNOSIS — I83229 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 of unspecified site and inflammation: Secondary | ICD-10-CM | POA: Diagnosis not present

## 2014-11-04 DIAGNOSIS — L97329 Non-pressure chronic ulcer of left ankle with unspecified severity: Secondary | ICD-10-CM | POA: Insufficient documentation

## 2014-11-04 DIAGNOSIS — I83222 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 of calf and inflammation: Secondary | ICD-10-CM

## 2014-11-04 DIAGNOSIS — L97309 Non-pressure chronic ulcer of unspecified ankle with unspecified severity: Secondary | ICD-10-CM

## 2014-11-04 DIAGNOSIS — I83003 Varicose veins of unspecified lower extremity with ulcer of ankle: Secondary | ICD-10-CM | POA: Insufficient documentation

## 2014-11-04 DIAGNOSIS — I872 Venous insufficiency (chronic) (peripheral): Secondary | ICD-10-CM | POA: Diagnosis not present

## 2014-11-04 NOTE — Assessment & Plan Note (Signed)
See above

## 2014-11-04 NOTE — Progress Notes (Signed)
Pre visit review using our clinic review tool, if applicable. No additional management support is needed unless otherwise documented below in the visit note. 

## 2014-11-04 NOTE — Patient Instructions (Signed)
Pass by [facility]'s office for referral to vascular doctor. Continue elevation of legs, start new compression stockings (10-19mmHg), drink plenty of water and avoid sodium/salt in diet. Continue vaseline dressing changes daily for small ulcer which I expect to heal well.   Venous Stasis or Chronic Venous Insufficiency Chronic venous insufficiency, also called venous stasis, is a condition that affects the veins in the legs. The condition prevents blood from being pumped through these veins effectively. Blood may no longer be pumped effectively from the legs back to the heart. This condition can range from mild to severe. With proper treatment, you should be able to continue with an active life. CAUSES  Chronic venous insufficiency occurs when the vein walls become stretched, weakened, or damaged or when valves within the vein are damaged. Some common causes of this include:  High blood pressure inside the veins (venous hypertension).  Increased blood pressure in the leg veins from long periods of sitting or standing.  A blood clot that blocks blood flow in a vein (deep vein thrombosis).  Inflammation of a superficial vein (phlebitis) that causes a blood clot to form. RISK FACTORS Various things can make you more likely to develop chronic venous insufficiency, including:  Family history of this condition.  Obesity.  Pregnancy.  Sedentary lifestyle.  Smoking.  Jobs requiring long periods of standing or sitting in one place.  Being a certain age. Women in their 62s and 56s and men in their 52s are more likely to develop this condition. SIGNS AND SYMPTOMS  Symptoms may include:   Varicose veins.  Skin breakdown or ulcers.  Reddened or discolored skin on the leg.  Brown, smooth, tight, and painful skin just above the ankle, usually on the inside surface (lipodermatosclerosis).  Swelling. DIAGNOSIS  To diagnose this condition, your health care provider will take a medical history  and do a physical exam. The following tests may be ordered to confirm the diagnosis:  Duplex ultrasound--A procedure that produces a picture of a blood vessel and nearby organs and also provides information on blood flow through the blood vessel.  Plethysmography--A procedure that tests blood flow.  A venogram, or venography--A procedure used to look at the veins using X-ray and dye. TREATMENT The goals of treatment are to help you return to an active life and to minimize pain or disability. Treatment will depend on the severity of the condition. Medical procedures may be needed for severe cases. Treatment options may include:   Use of compression stockings. These can help with symptoms and lower the chances of the problem getting worse, but they do not cure the problem.  Sclerotherapy--A procedure involving an injection of a material that "dissolves" the damaged veins. Other veins in the network of blood vessels take over the function of the damaged veins.  Surgery to remove the vein or cut off blood flow through the vein (vein stripping or laser ablation surgery).  Surgery to repair a valve. HOME CARE INSTRUCTIONS   Wear compression stockings as directed by your health care provider.  Only take over-the-counter or prescription medicines for pain, discomfort, or fever as directed by your health care provider.  Follow up with your health care provider as directed. SEEK MEDICAL CARE IF:   You have redness, swelling, or increasing pain in the affected area.  You see a red streak or line that extends up or down from the affected area.  You have a breakdown or loss of skin in the affected area, even if the breakdown  is small.  You have an injury to the affected area. SEEK IMMEDIATE MEDICAL CARE IF:   You have an injury and open wound in the affected area.  Your pain is severe and does not improve with medicine.  You have sudden numbness or weakness in the foot or ankle below the  affected area, or you have trouble moving your foot or ankle.  You have a fever or persistent symptoms for more than 2-3 days.  You have a fever and your symptoms suddenly get worse. MAKE SURE YOU:   Understand these instructions.  Will watch your condition.  Will get help right away if you are not doing well or get worse. Document Released: 02/25/2007 Document Revised: 08/12/2013 Document Reviewed: 06/29/2013 [facility] Patient Information 2015 [place], [place]. This information is not intended to replace advice given to you by your health care provider. Make sure you discuss any questions you have with your health care provider.

## 2014-11-04 NOTE — Assessment & Plan Note (Addendum)
Reasonable to refer per pt request back to [facility] for further evaluation given new vascular ulcer. Discussed treatment for this - continue vaseline dressing changes, continue elevation of legs, start compression stockings, rec low sodium diet (goal <1.5gm/day). Edema slowly improving with return to her normal diet. Anticipate hypertension on today's vitals related to holiday diet. Doubt arterial given good pulses but in h/o HLD may suggest statin.

## 2014-11-04 NOTE — Progress Notes (Addendum)
BP 154/86 mmHg  Pulse 68  Temp(Src) 98 F (36.7 C) (Oral)  Wt 220 lb 8 oz (100.018 kg)   CC: check wound  Subjective:    Patient ID: [name], female    DOB: 1946-01-08, 68 y.o.   MRN: [id number]  HPI: [name] is a 68 y.o. female presenting on 11/04/2014 for Wound Check and Edema   Patient with hypertension, chronic venous insufficiency, h/o DVT 2011 that developed after L leg venous ablation, melanoma 2011 s/p radiation therapy to R shoulder/axilla, and arthritis presents with 2 wk h/o ulcer that developed on side of L ankle. Self treated with vaseline. Also noted leg swelling through the holidays. Saw dermatologist this week who was concerned about vascular etiology. Tried to use compression stockings but unable to tolerate because they were too tight. Interested in referral back to [facility] for re evaluation.   Planning on getting refitted for new stockings at [facility] facility.   HTN - elevated today. Not complaint with low sodium diet over holiday season. Compliant with hyzaar 50/12.5mg  daily. She is also on aspirin daily. BP Readings from Last 3 Encounters:  11/04/14 154/86  09/22/14 118/82  09/17/14 131/70   L vein ablation 2011.  Relevant past medical, surgical, family and social history reviewed and updated as indicated. Interim medical history since our last visit reviewed. Allergies and medications reviewed and updated. Current Outpatient Prescriptions on File Prior to Visit  Medication Sig  . Acetaminophen (TYLENOL ARTHRITIS EXT RELIEF PO) Take 1 tablet by mouth daily as needed.  Marland Kitchen albuterol (PROAIR HFA) 108 (90 BASE) MCG/ACT inhaler Inhale 2 puffs into the lungs every 6 (six) hours as needed for wheezing or shortness of breath.  Marland Kitchen aspirin 81 MG tablet Take 81 mg by mouth daily.  Marland Kitchen KLOR-CON M10 10 MEQ tablet TAKE 2 TABLETS BY MOUTH DAILY.  Marland Kitchen loratadine (CLARITIN) 10 MG tablet Take 10 mg by mouth daily as needed.    Marland Kitchen losartan-hydrochlorothiazide (HYZAAR)  50-12.5 MG per tablet TAKE 1 TABLET BY MOUTH DAILY.  . montelukast (SINGULAIR) 10 MG tablet TAKE 1 TABLET BY MOUTH DAILY  . Multiple Vitamins-Minerals (MULTIVITAMIN PO) Take by mouth every morning.  . Probiotic Product (PROBIOTIC DAILY PO) Take by mouth every morning.  . Vitamin D, Ergocalciferol, (DRISDOL) 50000 UNITS CAPS capsule Take 1 capsule (50,000 Units total) by mouth every 7 (seven) days.   No current facility-administered medications on file prior to visit.   Past Medical History  Diagnosis Date  . Hypertension   . DVT (deep venous thrombosis) 2011    small, developed after venous ablation  . Tinnitus   . Asthma in adult   . Chronic venous insufficiency   . Melanoma 06/29/10    MALIGNANT MELANOMA R SHOULDER/SUPRASCAPULAR BACK s/p interferon chemo and XRT  . History of radiation therapy 07/19/11-08/25/2011    RIGHT AXILLARY REGION/METASTATIC  . History of kidney stones 1980s  . Neuromuscular disorder     muscle spasms  . Arthritis   . Seasonal allergies   . OSA (obstructive sleep apnea)     on CPAP  . RLS (restless legs syndrome)   . Anesthesia complication     trouble waking up 2/2 CPAP  . Unspecified vitamin D deficiency 03/18/2013  . Hearing loss sensory, bilateral 2013    mod-severe high freq sensorineural ([facility])  . Cervical spondylosis 2012    Jacelyn Grip)    Past Surgical History  Procedure Laterality Date  . Venous ablation  2010  left leg  . Lumbar laminectomy  2001    L4/5  . Deep axillary sentinel node biopsy / excision  2012    RIGHT  . Portacath placement      left subclavian  . Breast surgery Right 1990    BX   . Dilation and curettage of uterus  2010  . Port-a-cath removal  12/05/2011    Procedure: REMOVAL PORT-A-CATH;  Surgeon: Rolm Bookbinder, MD;  Location: [facility];  Service: General;  Laterality: N/A;  left port removal  . Breast biopsy Left 2013    BENIGN  . Knee surgery  1985    left  . Melanoma excision  2011     Right shoulder, with sentinel lymph node biopsy  . Dexa  12/2009    WNL  . Cardiovascular stress test  2010    normal stress test, EF 66%   Review of Systems Per HPI unless specifically indicated above     Objective:    BP 154/86 mmHg  Pulse 68  Temp(Src) 98 F (36.7 C) (Oral)  Wt 220 lb 8 oz (100.018 kg)  Wt Readings from Last 3 Encounters:  11/04/14 220 lb 8 oz (100.018 kg)  09/22/14 215 lb 8 oz (97.75 kg)  09/17/14 217 lb (98.431 kg)    Physical Exam  Constitutional: She appears well-developed and well-nourished. No distress.  Obese Body mass index is 40.06 kg/(m^2).  Musculoskeletal: She exhibits edema (2+ pitting at ankles).  1cm venous stasis ulcer L lateral ankle Some chronic venous insuff changes mild hemosiderin deposition medially bilateral lower legs 1+ DP bilaterally  Skin: Skin is warm and dry. No rash noted.  Psychiatric: She has a normal mood and affect.  Nursing note and vitals reviewed.  Results for orders placed or performed in visit on 10/03/14  HM DEXA SCAN  Result Value Ref Range   HM Dexa Scan WNL       Assessment & Plan:   Problem List Items Addressed This Visit    Venous stasis ulcer of ankle - Primary    Reasonable to refer per pt request back to [facility] for further evaluation given new vascular ulcer. Discussed treatment for this - continue vaseline dressing changes, continue elevation of legs, start compression stockings, rec low sodium diet (goal <1.5gm/day). Edema slowly improving with return to her normal diet. Anticipate hypertension on today's vitals related to holiday diet. Doubt arterial given good pulses but in h/o HLD may suggest statin.    Relevant Orders      Ambulatory referral to Vascular Surgery   Chronic venous insufficiency    See above.        Follow up plan: Return if symptoms worsen or fail to improve.

## 2014-11-05 DIAGNOSIS — I872 Venous insufficiency (chronic) (peripheral): Secondary | ICD-10-CM

## 2014-11-05 HISTORY — PX: ENDOVENOUS ABLATION SAPHENOUS VEIN W/ LASER: SUR449

## 2014-11-05 HISTORY — PX: OTHER SURGICAL HISTORY: SHX169

## 2014-11-05 HISTORY — DX: Venous insufficiency (chronic) (peripheral): I87.2

## 2014-11-10 DIAGNOSIS — Z1231 Encounter for screening mammogram for malignant neoplasm of breast: Secondary | ICD-10-CM | POA: Diagnosis not present

## 2014-11-10 DIAGNOSIS — Z923 Personal history of irradiation: Secondary | ICD-10-CM | POA: Diagnosis not present

## 2014-11-10 LAB — HM MAMMOGRAPHY: HM Mammogram: NORMAL

## 2014-11-12 ENCOUNTER — Telehealth: Payer: Self-pay | Admitting: Family Medicine

## 2014-11-12 ENCOUNTER — Other Ambulatory Visit: Payer: Self-pay | Admitting: *Deleted

## 2014-11-12 DIAGNOSIS — L97329 Non-pressure chronic ulcer of left ankle with unspecified severity: Secondary | ICD-10-CM

## 2014-11-12 DIAGNOSIS — Z86718 Personal history of other venous thrombosis and embolism: Secondary | ICD-10-CM

## 2014-11-12 NOTE — Telephone Encounter (Signed)
Patient Name: [name]  DOB: 1945/11/17    Nurse Assessment  Nurse: Mechele Dawley, RN, Amy Date/Time [name] Time): 11/12/2014 4:57:23 PM  Confirm and document reason for call. If symptomatic, describe symptoms. ---DURING HOLIDAYS SHE DEVELOPED SMALL ULCER ON HER LEG. SHE WAS INSTRUCTED ON HOW TO DRESS THE WOUND AND SUGGESTED TO WEAR COMPRESSION STOCKINGS. SHE [facility] AND TO [facility]. SHE ALSO HAS BROWN RING AROUND HER ANKLES. SWELLING HAS GONE DOWN SOME - THERE IS STILL SOME SWELLING. SHE WENT AND GOT FITTED FOR THE COMPRESSION STOCKINGS - SHE WAS UNABLE TO WEAR THEM DUE TO THE SWELLING. [facility] SHE HAS AN APPT AT END OF JANUARY. THE PCP HAS LEFT A MESSAGE FOR THE SPECIALIST TO TRY AND GET HER IN FOR AN APPT THE FIRST OF NEXT WEEK. SHE STATES THAT SHE IS ELEVATING HER LEGS, COMPRESSION HOSE WHEN SHE CAN TOLERATE, LOTS OF WATER. LEGS DO NOT FEEL WARM. THE ULCER AREA WAS A BURNING AREA. THIS MORNING THERE WAS ONLY A SMALL SCAB AREA OF THE ULCERATION. NO OPEN AREA AT ALL.  Has the patient traveled out of the country within the last 30 days? ---Not Applicable  Does the patient require triage? ---Yes  Related visit to physician within the last 2 weeks? ---Yes  Does the PT have any chronic conditions? (i.e. diabetes, asthma, etc.) ---Yes  List chronic conditions. ---VEIN ISSUES, HTN     Guidelines    Guideline Title Affirmed Question Affirmed Notes  Leg Swelling and Edema SEVERE leg swelling (e.g., swelling extends above knee, entire leg is swollen, weeping fluid) SHE STATES ITS NOT AS BAD AS IT WAS PREVIOUSLY PRIOR TO SEEING THE MD   Final Disposition User   See Physician within 4 Hours (or PCP triage) Mechele Dawley, RN, Amy

## 2014-11-15 NOTE — Telephone Encounter (Signed)
Will see tomorrow

## 2014-11-15 NOTE — Telephone Encounter (Signed)
PLEASE NOTE: All timestamps contained within this report are represented as [place] Standard Time. CONFIDENTIALTY NOTICE: This fax transmission is intended only for the addressee. It contains information that is legally privileged, confidential or otherwise protected from use or disclosure. If you are not the intended recipient, you are strictly prohibited from reviewing, disclosing, copying using or disseminating any of this information or taking any action in reliance on or regarding this information. If you have received this fax in error, please notify us immediately by telephone so that we can arrange for its return to [place]. Phone: [phone], Toll-Free: [phone], Fax: [phone] Page: 1 of 2 Call Id: [id number] Raymond Patient Name: [name] Gender: Female DOB: [birth year] Age: 69 Y 4 M 4 D Return Phone Number: [phone] (Primary) Address: City/State/Zip: [place] Client [name] - Client Client Site [place], [place] Contact Type Call Call Type Triage / Clinical Relationship To Patient Self Appointment Disposition EMR Appointment Not Necessary Return Phone Number [phone] (Primary) Chief Complaint Leg Swelling And Edema Initial Comment Caller states legs and feet are swelling, small leg ulcer, poor circulation, ankles very tender to touch, PreDisposition Did not know what to do Nurse Assessment Nurse: Mechele Dawley, RN, Amy Date/Time (Eastern Time): 11/12/2014 4:57:23 PM Confirm and document reason for call. If symptomatic, describe symptoms. ---DURING HOLIDAYS SHE DEVELOPED SMALL ULCER ON HER LEG. SHE WAS INSTRUCTED ON HOW TO DRESS THE WOUND AND SUGGESTED TO WEAR COMPRESSION STOCKINGS. SHE WAS TO [facility] AND TO [facility]. SHE ALSO HAS BROWN RING AROUND HER ANKLES. SWELLING HAS  GONE DOWN SOME - THERE IS STILL SOME SWELLING. SHE WENT AND GOT FITTED FOR THE COMPRESSION STOCKINGS - SHE WAS UNABLE TO WEAR THEM DUE TO THE SWELLING. [facility] SHE HAS AN APPT AT END OF JANUARY. THE PCP HAS LEFT A MESSAGE FOR THE SPECIALIST TO TRY AND GET HER IN FOR AN APPT THE FIRST OF NEXT WEEK. SHE STATES THAT SHE IS ELEVATING HER LEGS, COMPRESSION HOSE WHEN SHE CAN TOLERATE, LOTS OF WATER. LEGS DO NOT FEEL WARM. THE ULCER AREA WAS A BURNING AREA. THIS MORNING THERE WAS ONLY A SMALL SCAB AREA OF THE ULCERATION. NO OPEN AREA AT ALL. Has the patient traveled out of the country within the last 30 days? ---Not Applicable Does the patient require triage? ---Yes Related visit to physician within the last 2 weeks? ---Yes Does the PT have any chronic conditions? (i.e. diabetes, asthma, etc.) ---Yes List chronic conditions. ---VEIN ISSUES, HTN PLEASE NOTE: All timestamps contained within this report are represented as [place] Standard Time. CONFIDENTIALTY NOTICE: This fax transmission is intended only for the addressee. It contains information that is legally privileged, confidential or otherwise protected from use or disclosure. If you are not the intended recipient, you are strictly prohibited from reviewing, disclosing, copying using or disseminating any of this information or taking any action in reliance on or regarding this information. If you have received this fax in error, please notify us immediately by telephone so that we can arrange for its return to [place]. Phone: [phone], Toll-Free: [phone], Fax: [phone] Page: 2 of 2 Call Id: [id number] Guidelines Guideline Title Affirmed Question Affirmed Notes Nurse Date/Time [name] Time) Leg Swelling and Edema SEVERE leg swelling (e.g., swelling extends above knee, entire leg is swollen, weeping fluid) SHE STATES ITS NOT AS BAD AS IT WAS PREVIOUSLY PRIOR  TO [facility] THE MD Jewell [place], RN, [place] 11/12/2014 4:58:53 PM Disp.  Time [name] Time) Disposition Final User 11/12/2014 5:01:45 PM See Physician within 4 Hours (or PCP triage) Yes Mechele Dawley, RN, Amy Caller Understands: Yes Disagree/Comply: Comply Care Advice Given Per Guideline SEE PHYSICIAN WITHIN 4 HOURS (or PCP triage): CARE ADVICE given per Leg Swelling and Edema (Adult) guideline. * You become worse. CALL BACK IF: After Care Instructions Given Call Event Type User Date / Time Description Comments User: Susanne Borders, RN Date/Time [name] Time): 11/12/2014 5:04:15 PM CALLER STATES THAT SHE IS MUCH IMPROVED FROM WHEN SHE SAW THE MD THE OTHER DAY. SHE IS GOING TO ELEVATE HER LEGS, WEAR SOME LEGGINS THAT SHE HAS - CAN NOT WEAR COMPRESSION STOCKINGS AS OF YET. HER ANKLES ARE SWOLLEN. SHE WALKED ALL DAY AT A [facility] TODAY. SHE DID VERBALIZE IF THEY GET WORSE SHE WILL GO IN AND BE EVALUATED. Referrals GO TO FACILITY UNDECIDED

## 2014-11-15 NOTE — Telephone Encounter (Signed)
Pt has appt on 11/16/14

## 2014-11-16 ENCOUNTER — Ambulatory Visit: Payer: Medicare Other | Admitting: Family Medicine

## 2014-11-16 ENCOUNTER — Ambulatory Visit (HOSPITAL_COMMUNITY)
Admission: RE | Admit: 2014-11-16 | Discharge: 2014-11-16 | Disposition: A | Discharge status: Home / Self Care | Payer: Medicare Other | Source: Ambulatory Visit | Attending: Vascular Surgery | Admitting: Vascular Surgery

## 2014-11-16 ENCOUNTER — Ambulatory Visit (INDEPENDENT_AMBULATORY_CARE_PROVIDER_SITE_OTHER): Payer: Medicare Other | Admitting: Vascular Surgery

## 2014-11-16 ENCOUNTER — Encounter: Payer: Self-pay | Admitting: Vascular Surgery

## 2014-11-16 VITALS — BP 158/90 | HR 80 | Resp 16 | Ht 63.0 in | Wt 220.0 lb

## 2014-11-16 DIAGNOSIS — Z86718 Personal history of other venous thrombosis and embolism: Secondary | ICD-10-CM | POA: Diagnosis not present

## 2014-11-16 DIAGNOSIS — L97329 Non-pressure chronic ulcer of left ankle with unspecified severity: Secondary | ICD-10-CM | POA: Diagnosis not present

## 2014-11-16 DIAGNOSIS — I83893 Varicose veins of bilateral lower extremities with other complications: Secondary | ICD-10-CM | POA: Diagnosis not present

## 2014-11-16 NOTE — Progress Notes (Signed)
Subjective:     Patient ID: [name], female   DOB: 1946/07/14, 69 y.o.   MRN: [id number]  HPI this 69 year old female is evaluated for painful varicosities in the left leg and a venous ulcer in the left ankle. Patient had previous laser ablation of left great saphenous vein at [facility] 3 years ago. She developed a DVT in the left common femoral vein requiring anticoagulants for 2-3 months. She has painful bulging varicosities in the left thigh and calf area which have been present for several years and continued to cause symptoms. She has developed an ulcer in the lateral aspect of her left lower leg and has had darkening of the skin which has been progressing. She also has noticed hypersensitivity and darkness of the skin on the right with chronic edema of both legs. She has worn short leg elastic compression stockings with no improvement.  Past Medical History  Diagnosis Date  . Hypertension   . DVT (deep venous thrombosis) 2011    small, developed after venous ablation  . Tinnitus   . Asthma in adult   . Chronic venous insufficiency   . Melanoma 06/29/10    MALIGNANT MELANOMA R SHOULDER/SUPRASCAPULAR BACK s/p interferon chemo and XRT  . History of radiation therapy 07/19/11-08/25/2011    RIGHT AXILLARY REGION/METASTATIC  . History of kidney stones 1980s  . Neuromuscular disorder     muscle spasms  . Arthritis   . Seasonal allergies   . OSA (obstructive sleep apnea)     on CPAP  . RLS (restless legs syndrome)   . Anesthesia complication     trouble waking up 2/2 CPAP  . Unspecified vitamin D deficiency 03/18/2013  . Hearing loss sensory, bilateral 2013    mod-severe high freq sensorineural ([facility])  . Cervical spondylosis 2012    Jacelyn Grip)    History  Substance Use Topics  . Smoking status: Former Smoker    Types: Cigarettes    Quit date: 11/05/1965  . Smokeless tobacco: Never Used     Comment: socially as a teen  . Alcohol Use: Yes     Comment:  drinks wine occasionally    Family History  Problem Relation Age of Onset  . Cancer Mother 45    uterine  . Cancer Cousin     x2, breast  . CAD Father 29    MI  . Hypertension Father 29  . Diabetes Sister   . Cancer Paternal Grandmother     melanoma, possibly  . Alzheimer's disease Father 55    Allergies  Allergen Reactions  . Sulfa Drugs Cross Reactors Swelling    Sulfa medications-swelling of face    Current outpatient prescriptions: Acetaminophen (TYLENOL ARTHRITIS EXT RELIEF PO), Take 1 tablet by mouth daily as needed., Disp: , Rfl: ;  albuterol (PROAIR HFA) 108 (90 BASE) MCG/ACT inhaler, Inhale 2 puffs into the lungs every 6 (six) hours as needed for wheezing or shortness of breath., Disp: 1 Inhaler, Rfl: 6;  aspirin 81 MG tablet, Take 81 mg by mouth daily., Disp: , Rfl:  KLOR-CON M10 10 MEQ tablet, TAKE 2 TABLETS BY MOUTH DAILY., Disp: 90 tablet, Rfl: 3;  loratadine (CLARITIN) 10 MG tablet, Take 10 mg by mouth daily as needed.  , Disp: , Rfl: ;  losartan-hydrochlorothiazide (HYZAAR) 50-12.5 MG per tablet, TAKE 1 TABLET BY MOUTH DAILY., Disp: 30 tablet, Rfl: 6;  montelukast (SINGULAIR) 10 MG tablet, TAKE 1 TABLET BY MOUTH DAILY, Disp: 30 tablet, Rfl: 11  Multiple Vitamins-Minerals (MULTIVITAMIN PO), Take by mouth every morning., Disp: , Rfl: ;  Probiotic Product (PROBIOTIC DAILY PO), Take by mouth every morning., Disp: , Rfl: ;  Vitamin D, Ergocalciferol, (DRISDOL) 50000 UNITS CAPS capsule, Take 1 capsule (50,000 Units total) by mouth every 7 (seven) days., Disp: 4 capsule, Rfl: 5  BP 158/90 mmHg  Pulse 80  Resp 16  Ht 5\' 3"  (1.6 m)  Wt 220 lb (99.791 kg)  BMI 38.98 kg/m2  Body mass index is 38.98 kg/(m^2).         Review of Systems denies chest pain but does have a history of asthma with occasional wheezing. Has bilateral lower extremity edema. Has previous melanoma on right shoulder removed. Other systems negative and complete review of systems     Objective:    Physical Exam BP 158/90 mmHg  Pulse 80  Resp 16  Ht 5\' 3"  (1.6 m)  Wt 220 lb (99.791 kg)  BMI 38.98 kg/m2  Gen.-alert and oriented x3 in no apparent distress  -obese HEENT normal for age Lungs no rhonchi or wheezing Cardiovascular regular rhythm no murmurs carotid pulses 3+ palpable no bruits audible Abdomen soft nontender no palpable masses Musculoskeletal free of  major deformities Skin clear -bilateral hyperpigmentation lower third of legs left worse than right with active ulcer lateral aspect lower third left leg about 3 mm in diameter. Bulging varicosities left anterior and lateral thigh and lateral to knee into lateral calf area. Neurologic normal Lower extremities 3+ femoral and dorsalis pedis pulses palpable bilaterally with 1+ edema bilaterally.  Today I ordered a venous duplex exam the left leg which I reviewed and interpreted. There is no DVT. The left great saphenous vein is totally ablated. The anterior accessory branch of the left great saphenous vein has severe reflux and is supplying the bulging varicosities in the anterior and lateral thigh.       Assessment:     Severe reflux left great saphenous system and anterior accessory branch supplying painful varicosities in patient with stasis ulcer left ankle and chronic edema and pain Also has pain and swelling and contralateral right leg with hyperpigmentation area    Plan:         #1 long leg elastic compression stockings 20-30 mm gradient #2 elevate legs as much as possible #3 ibuprofen daily on a regular basis for pain #4 return in 3 months-if no significant improvement then patient will need laser ablation anterior accessory branch left great saphenous vein with return appointment 3 months later to see if stab phlebectomy of residual varicosities will be indicated Will obtain formal venous duplex exam on return of right leg to determine if any procedure indicated on that side

## 2014-11-16 NOTE — Addendum Note (Signed)
Addended by: Mena Goes on: 11/16/2014 02:40 PM   Modules accepted: Orders

## 2014-11-17 ENCOUNTER — Encounter: Payer: Self-pay | Admitting: Family Medicine

## 2014-11-25 DIAGNOSIS — I8311 Varicose veins of right lower extremity with inflammation: Secondary | ICD-10-CM | POA: Diagnosis not present

## 2014-11-25 DIAGNOSIS — L570 Actinic keratosis: Secondary | ICD-10-CM | POA: Diagnosis not present

## 2014-11-25 DIAGNOSIS — D485 Neoplasm of uncertain behavior of skin: Secondary | ICD-10-CM | POA: Diagnosis not present

## 2014-11-25 DIAGNOSIS — Z85828 Personal history of other malignant neoplasm of skin: Secondary | ICD-10-CM | POA: Diagnosis not present

## 2014-11-25 DIAGNOSIS — I8312 Varicose veins of left lower extremity with inflammation: Secondary | ICD-10-CM | POA: Diagnosis not present

## 2014-11-25 DIAGNOSIS — D1801 Hemangioma of skin and subcutaneous tissue: Secondary | ICD-10-CM | POA: Diagnosis not present

## 2014-11-25 DIAGNOSIS — Z85820 Personal history of malignant melanoma of skin: Secondary | ICD-10-CM | POA: Diagnosis not present

## 2014-12-02 ENCOUNTER — Encounter: Payer: Medicare Other | Admitting: Vascular Surgery

## 2014-12-02 ENCOUNTER — Encounter (HOSPITAL_COMMUNITY): Payer: Medicare Other

## 2014-12-09 ENCOUNTER — Encounter: Payer: Self-pay | Admitting: Family Medicine

## 2014-12-09 ENCOUNTER — Ambulatory Visit (INDEPENDENT_AMBULATORY_CARE_PROVIDER_SITE_OTHER): Payer: Medicare Other | Admitting: Family Medicine

## 2014-12-09 VITALS — BP 150/88 | HR 68 | Temp 98.1°F | Wt 218.5 lb

## 2014-12-09 DIAGNOSIS — I83222 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 of calf and inflammation: Secondary | ICD-10-CM | POA: Diagnosis not present

## 2014-12-09 DIAGNOSIS — I83229 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 of unspecified site and inflammation: Secondary | ICD-10-CM

## 2014-12-09 DIAGNOSIS — L97329 Non-pressure chronic ulcer of left ankle with unspecified severity: Principal | ICD-10-CM

## 2014-12-09 DIAGNOSIS — I83225 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 other part of foot and inflammation: Secondary | ICD-10-CM | POA: Diagnosis not present

## 2014-12-09 DIAGNOSIS — I83224 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 of heel and midfoot and inflammation: Secondary | ICD-10-CM

## 2014-12-09 DIAGNOSIS — I83023 Varicose veins of left lower extremity with ulcer of ankle: Secondary | ICD-10-CM

## 2014-12-09 DIAGNOSIS — I83223 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 of ankle and inflammation: Secondary | ICD-10-CM

## 2014-12-09 DIAGNOSIS — I83221 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 of thigh and inflammation: Secondary | ICD-10-CM

## 2014-12-09 DIAGNOSIS — I83228 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 of other part of lower extremity and inflammation: Secondary | ICD-10-CM | POA: Diagnosis not present

## 2014-12-09 DIAGNOSIS — I872 Venous insufficiency (chronic) (peripheral): Secondary | ICD-10-CM

## 2014-12-09 MED ORDER — FUROSEMIDE 20 MG PO TABS: 20.0000 mg | ORAL_TABLET | Freq: Every day | ORAL | Status: DC | PRN

## 2014-12-09 NOTE — Progress Notes (Signed)
Pre visit review using our clinic review tool, if applicable. No additional management support is needed unless otherwise documented below in the visit note. 

## 2014-12-09 NOTE — Assessment & Plan Note (Addendum)
See above. Continue to encourage elevation of legs, low salt diet. Pt unable to tolerate any compression stockings. Will start lasix 20mg  daily x 5 days then prn.

## 2014-12-09 NOTE — Assessment & Plan Note (Signed)
She has not been able to tolerate compression stockings recommended, not even low 10cm compression stockings. Given slow healing process as well as h/o difficult to heal ulcers in past (needed wound clinic referral), will place in unna boot (pt has tolerated this well in past). Alb level ok - anticipate good nutritional status. For pedal edema - will start lasix 20mg  daily for 5 days then prn leg swelling. Return in 1 wk for f/u.

## 2014-12-09 NOTE — Patient Instructions (Addendum)
Unna boot placement today. Take lasix 20mg  daily for 5 days then as needed. Take in morning. Return on Thursday for unna boot removal.

## 2014-12-09 NOTE — Progress Notes (Signed)
BP 150/88 mmHg  Pulse 68  Temp(Src) 98.1 F (36.7 C) (Oral)  Wt 218 lb 8 oz (99.111 kg)   CC: persistent leg swelling.  Subjective:    Patient ID: [name], female    DOB: [name] 09, 1946, 69 y.o.   MRN: [id number]  HPI: [name] is a 69 y.o. female presenting on 12/09/2014 for Leg Swelling   Patient with hypertension, chronic venous insufficiency, h/o DVT 2011 that developed after L leg venous ablation, melanoma 2011 s/p radiation therapy to R shoulder/axilla, and arthritis presents with recurrent leg swelling. L ankle ulcer persists.   She recently went to get fitted for new stockings at [facility] facility - but unable to tolerate even low compression.   She also recently saw Dr Kellie Simmering for L leg who recommended compression stockings 20-26mm gradient, elevation, ibuprofen and f/u in 3 months to discuss laser ablation and eval of right leg.  Main concern is persistent L ankle sore, and bilateral leg swelling and pain. She asks if she needs to see wound center. She changes bandage ever day to every other day, treats with vaseline. States ulcer was measured several weeks ago by derm, 31mm.  Pt denies cough, dyspnea.  Last albumin: 3.8 (09/2014).  Lab Results  Component Value Date   CREATININE 1.0 09/17/2014    BP Readings from Last 3 Encounters:  12/09/14 150/88  11/16/14 158/90  11/04/14 154/86    Relevant past medical, surgical, family and social history reviewed and updated as indicated. Interim medical history since our last visit reviewed. Allergies and medications reviewed and updated. Current Outpatient Prescriptions on File Prior to Visit  Medication Sig  . Acetaminophen (TYLENOL ARTHRITIS EXT RELIEF PO) Take 1 tablet by mouth daily as needed.  Marland Kitchen albuterol (PROAIR HFA) 108 (90 BASE) MCG/ACT inhaler Inhale 2 puffs into the lungs every 6 (six) hours as needed for wheezing or shortness of breath.  Marland Kitchen aspirin 81 MG tablet Take 81 mg by mouth daily.  Marland Kitchen KLOR-CON M10 10  MEQ tablet TAKE 2 TABLETS BY MOUTH DAILY.  Marland Kitchen loratadine (CLARITIN) 10 MG tablet Take 10 mg by mouth daily as needed.    Marland Kitchen losartan-hydrochlorothiazide (HYZAAR) 50-12.5 MG per tablet TAKE 1 TABLET BY MOUTH DAILY.  . montelukast (SINGULAIR) 10 MG tablet TAKE 1 TABLET BY MOUTH DAILY  . Multiple Vitamins-Minerals (MULTIVITAMIN PO) Take by mouth every morning.  . Probiotic Product (PROBIOTIC DAILY PO) Take by mouth every morning.  . Vitamin D, Ergocalciferol, (DRISDOL) 50000 UNITS CAPS capsule Take 1 capsule (50,000 Units total) by mouth every 7 (seven) days.   No current facility-administered medications on file prior to visit.    Review of Systems Per HPI unless specifically indicated above     Objective:    BP 150/88 mmHg  Pulse 68  Temp(Src) 98.1 F (36.7 C) (Oral)  Wt 218 lb 8 oz (99.111 kg)  Wt Readings from Last 3 Encounters:  12/09/14 218 lb 8 oz (99.111 kg)  11/16/14 220 lb (99.791 kg)  11/04/14 220 lb 8 oz (100.018 kg)    Physical Exam  Constitutional: She appears well-developed and well-nourished. No distress.  obese  Musculoskeletal: She exhibits edema (1+ tender pitting edema bilaterally).  varicosities present bilateral legs without palpable cords. Diffuse tenderness to palpation legs below knees. L lateral leg superior to ankle with 35mm venous ulcer 1+ DP bilaterally  Skin: Skin is warm and dry. No rash noted. No erythema.  Psychiatric: She has a normal mood and affect.  Nursing note and vitals reviewed.      Assessment & Plan:   Problem List Items Addressed This Visit    Venous stasis ulcer of ankle - Primary    She has not been able to tolerate compression stockings recommended, not even low 10cm compression stockings. Given slow healing process as well as h/o difficult to heal ulcers in past (needed wound clinic referral), will place in unna boot (pt has tolerated this well in past). Alb level ok - anticipate good nutritional status. For pedal edema - will  start lasix 20mg  daily for 5 days then prn leg swelling. Return in 1 wk for f/u.      Chronic venous insufficiency    See above. Continue to encourage elevation of legs, low salt diet. Pt unable to tolerate any compression stockings. Will start lasix 20mg  daily x 5 days then prn.      Relevant Medications   furosemide (LASIX) tablet       Follow up plan: Return in about 1 week (around 12/16/2014), or as needed, for follow up visit.

## 2014-12-16 ENCOUNTER — Ambulatory Visit (INDEPENDENT_AMBULATORY_CARE_PROVIDER_SITE_OTHER): Payer: Medicare Other | Admitting: Family Medicine

## 2014-12-16 ENCOUNTER — Encounter: Payer: Medicare Other | Admitting: Vascular Surgery

## 2014-12-16 ENCOUNTER — Encounter: Payer: Self-pay | Admitting: Family Medicine

## 2014-12-16 ENCOUNTER — Encounter (HOSPITAL_COMMUNITY): Payer: Medicare Other

## 2014-12-16 VITALS — BP 140/82 | HR 62 | Temp 97.9°F | Wt 214.5 lb

## 2014-12-16 DIAGNOSIS — I83228 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 of other part of lower extremity and inflammation: Secondary | ICD-10-CM | POA: Diagnosis not present

## 2014-12-16 DIAGNOSIS — I83225 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 other part of foot and inflammation: Secondary | ICD-10-CM | POA: Diagnosis not present

## 2014-12-16 DIAGNOSIS — I83229 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 of unspecified site and inflammation: Secondary | ICD-10-CM

## 2014-12-16 DIAGNOSIS — I83223 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 of ankle and inflammation: Secondary | ICD-10-CM

## 2014-12-16 DIAGNOSIS — I83023 Varicose veins of left lower extremity with ulcer of ankle: Secondary | ICD-10-CM

## 2014-12-16 DIAGNOSIS — I83222 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 of calf and inflammation: Secondary | ICD-10-CM | POA: Diagnosis not present

## 2014-12-16 DIAGNOSIS — I83224 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 of heel and midfoot and inflammation: Secondary | ICD-10-CM

## 2014-12-16 DIAGNOSIS — L97329 Non-pressure chronic ulcer of left ankle with unspecified severity: Principal | ICD-10-CM

## 2014-12-16 DIAGNOSIS — I83221 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 of thigh and inflammation: Secondary | ICD-10-CM

## 2014-12-16 NOTE — Patient Instructions (Signed)
Wound looking better. L unna boot replaced.

## 2014-12-16 NOTE — Progress Notes (Signed)
   BP 140/82 mmHg  Pulse 62  Temp(Src) 97.9 F (36.6 C) (Oral)  Wt 214 lb 8 oz (97.297 kg)   CC: unna boot removal  Subjective:    Patient ID: [name], female    DOB: 10/29/1946, 69 y.o.   MRN: [id number]  HPI: [name] is a 69 y.o. female presenting on 12/16/2014 for Follow-up   See prior notes for details. Here today for unna boot removal and reassessment of venous stasis ulcer lateral LEFT ankle.  Relevant past medical, surgical, family and social history reviewed and updated as indicated. Interim medical history since our last visit reviewed. Allergies and medications reviewed and updated. Current Outpatient Prescriptions on File Prior to Visit  Medication Sig  . Acetaminophen (TYLENOL ARTHRITIS EXT RELIEF PO) Take 1 tablet by mouth daily as needed.  Marland Kitchen albuterol (PROAIR HFA) 108 (90 BASE) MCG/ACT inhaler Inhale 2 puffs into the lungs every 6 (six) hours as needed for wheezing or shortness of breath.  Marland Kitchen aspirin 81 MG tablet Take 81 mg by mouth daily.  . furosemide (LASIX) 20 MG tablet Take 1 tablet (20 mg total) by mouth daily as needed for fluid.  Marland Kitchen KLOR-CON M10 10 MEQ tablet TAKE 2 TABLETS BY MOUTH DAILY.  Marland Kitchen loratadine (CLARITIN) 10 MG tablet Take 10 mg by mouth daily as needed.    Marland Kitchen losartan-hydrochlorothiazide (HYZAAR) 50-12.5 MG per tablet TAKE 1 TABLET BY MOUTH DAILY.  . montelukast (SINGULAIR) 10 MG tablet TAKE 1 TABLET BY MOUTH DAILY  . Multiple Vitamins-Minerals (MULTIVITAMIN PO) Take by mouth every morning.  . Probiotic Product (PROBIOTIC DAILY PO) Take by mouth every morning.  . Vitamin D, Ergocalciferol, (DRISDOL) 50000 UNITS CAPS capsule Take 1 capsule (50,000 Units total) by mouth every 7 (seven) days.   No current facility-administered medications on file prior to visit.    Review of Systems Per HPI unless specifically indicated above     Objective:    BP 140/82 mmHg  Pulse 62  Temp(Src) 97.9 F (36.6 C) (Oral)  Wt 214 lb 8 oz (97.297 kg)    Wt Readings from Last 3 Encounters:  12/16/14 214 lb 8 oz (97.297 kg)  12/09/14 218 lb 8 oz (99.111 kg)  11/16/14 220 lb (99.791 kg)    Physical Exam  Constitutional: She appears well-developed and well-nourished. No distress.  Musculoskeletal: She exhibits edema (tr pedal).  L leg smaller than right after 1 wk unna boot. L ankle ulcer closing in, granulation tissue present, without surrounding erythema induration or drainage, still about 82mm diameter. Tender to palpation R leg Diminished pulses bilaterally  Skin: Skin is warm and dry. No rash noted. No erythema.  Nursing note and vitals reviewed.     Assessment & Plan:   Problem List Items Addressed This Visit    Venous stasis ulcer of ankle - Primary    Improvement in ulcer noted but still present. Will replace unna boot x 1 week - pt prefers this as well. Return 1 wk for unna boot removal. No evidence of infection today. rec continued elevation of leg Pt agrees with plan.          Follow up plan: Return in about 1 week (around 12/23/2014) for follow up visit.

## 2014-12-16 NOTE — Assessment & Plan Note (Signed)
Improvement in ulcer noted but still present. Will replace unna boot x 1 week - pt prefers this as well. Return 1 wk for unna boot removal. No evidence of infection today. rec continued elevation of leg Pt agrees with plan.

## 2014-12-16 NOTE — Progress Notes (Signed)
Pre visit review using our clinic review tool, if applicable. No additional management support is needed unless otherwise documented below in the visit note. 

## 2014-12-23 ENCOUNTER — Ambulatory Visit (INDEPENDENT_AMBULATORY_CARE_PROVIDER_SITE_OTHER): Payer: Medicare Other | Admitting: Family Medicine

## 2014-12-23 ENCOUNTER — Encounter: Payer: Self-pay | Admitting: Family Medicine

## 2014-12-23 VITALS — BP 138/74 | HR 62 | Temp 98.2°F | Wt 217.5 lb

## 2014-12-23 DIAGNOSIS — I83228 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 of other part of lower extremity and inflammation: Secondary | ICD-10-CM

## 2014-12-23 DIAGNOSIS — I83222 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 of calf and inflammation: Secondary | ICD-10-CM | POA: Diagnosis not present

## 2014-12-23 DIAGNOSIS — L97329 Non-pressure chronic ulcer of left ankle with unspecified severity: Principal | ICD-10-CM

## 2014-12-23 DIAGNOSIS — I872 Venous insufficiency (chronic) (peripheral): Secondary | ICD-10-CM

## 2014-12-23 DIAGNOSIS — I83223 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 of ankle and inflammation: Secondary | ICD-10-CM | POA: Diagnosis not present

## 2014-12-23 DIAGNOSIS — I83224 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 of heel and midfoot and inflammation: Secondary | ICD-10-CM | POA: Diagnosis not present

## 2014-12-23 DIAGNOSIS — I83221 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 of thigh and inflammation: Secondary | ICD-10-CM

## 2014-12-23 DIAGNOSIS — I83023 Varicose veins of left lower extremity with ulcer of ankle: Secondary | ICD-10-CM

## 2014-12-23 DIAGNOSIS — M79604 Pain in right leg: Secondary | ICD-10-CM

## 2014-12-23 DIAGNOSIS — I83229 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 of unspecified site and inflammation: Secondary | ICD-10-CM | POA: Diagnosis not present

## 2014-12-23 DIAGNOSIS — I83225 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 other part of foot and inflammation: Secondary | ICD-10-CM | POA: Diagnosis not present

## 2014-12-23 LAB — BASIC METABOLIC PANEL
BUN: 22 mg/dL (ref 6–23)
CO2: 31 mEq/L (ref 19–32)
Calcium: 9.6 mg/dL (ref 8.4–10.5)
Chloride: 104 mEq/L (ref 96–112)
Creatinine, Ser: 0.8 mg/dL (ref 0.40–1.20)
GFR: 75.71 mL/min (ref >60.00)
Glucose, Bld: 99 mg/dL (ref 70–99)
Potassium: 3.7 mEq/L (ref 3.5–5.1)
Sodium: 138 mEq/L (ref 135–145)

## 2014-12-23 NOTE — Progress Notes (Signed)
Pre visit review using our clinic review tool, if applicable. No additional management support is needed unless otherwise documented below in the visit note. 

## 2014-12-23 NOTE — Assessment & Plan Note (Signed)
With nonpitting pedal edema and persistent R leg pain. Continue to monitor with lasix 20mg  QOD + KDur. Check BMP today.

## 2014-12-23 NOTE — Patient Instructions (Addendum)
Wound has scabbed over. Try compression stocking. Update [place] if any concerns. Call vascular doctor to see if you can be seen sooner because of continued right leg pain. Blood work today.

## 2014-12-23 NOTE — Progress Notes (Addendum)
BP 138/74 mmHg  Pulse 62  Temp(Src) 98.2 F (36.8 C) (Oral)  Wt 217 lb 8 oz (98.657 kg)   CC: unna boot removal  Subjective:    Patient ID: [name], female    DOB: August 06, 1946, 69 y.o.   MRN: [id number]  HPI: [name] is a 69 y.o. female presenting on 12/23/2014 for Follow-up   See prior note for details.  Unna boot removed today - ulcer has scabbed over. Continues lasix 20mg  QOD and kDur 53mEq 2 tabs daily.  Has f/u appt with [facility] for 02/2015. Requests sooner appt for persistent R leg pain/swelling.  Relevant past medical, surgical, family and social history reviewed and updated as indicated. Interim medical history since our last visit reviewed. Allergies and medications reviewed and updated. Current Outpatient Prescriptions on File Prior to Visit  Medication Sig  . Acetaminophen (TYLENOL ARTHRITIS EXT RELIEF PO) Take 1 tablet by mouth daily as needed.  Marland Kitchen albuterol (PROAIR HFA) 108 (90 BASE) MCG/ACT inhaler Inhale 2 puffs into the lungs every 6 (six) hours as needed for wheezing or shortness of breath.  Marland Kitchen aspirin 81 MG tablet Take 81 mg by mouth daily.  . furosemide (LASIX) 20 MG tablet Take 1 tablet (20 mg total) by mouth daily as needed for fluid.  Marland Kitchen KLOR-CON M10 10 MEQ tablet TAKE 2 TABLETS BY MOUTH DAILY.  Marland Kitchen loratadine (CLARITIN) 10 MG tablet Take 10 mg by mouth daily as needed.    Marland Kitchen losartan-hydrochlorothiazide (HYZAAR) 50-12.5 MG per tablet TAKE 1 TABLET BY MOUTH DAILY.  . montelukast (SINGULAIR) 10 MG tablet TAKE 1 TABLET BY MOUTH DAILY  . Multiple Vitamins-Minerals (MULTIVITAMIN PO) Take by mouth every morning.  . Probiotic Product (PROBIOTIC DAILY PO) Take by mouth every morning.  . Vitamin D, Ergocalciferol, (DRISDOL) 50000 UNITS CAPS capsule Take 1 capsule (50,000 Units total) by mouth every 7 (seven) days.   No current facility-administered medications on file prior to visit.    Review of Systems Per HPI unless specifically indicated above       Objective:    BP 138/74 mmHg  Pulse 62  Temp(Src) 98.2 F (36.8 C) (Oral)  Wt 217 lb 8 oz (98.657 kg)  Wt Readings from Last 3 Encounters:  12/23/14 217 lb 8 oz (98.657 kg)  12/16/14 214 lb 8 oz (97.297 kg)  12/09/14 218 lb 8 oz (99.111 kg)    Physical Exam  Constitutional: She appears well-developed and well-nourished. No distress.  Musculoskeletal: She exhibits edema.  Somewhat diminished pulses bilaterally L lateral ulcer has scabbed over. Swelling has improved on left. Persistent painful nonpitting edema on right.  Skin: Skin is warm and dry.  Nursing note and vitals reviewed.   Lab Results  Component Value Date   CREATININE 0.80 12/23/2014      Assessment & Plan:   Problem List Items Addressed This Visit    Venous stasis ulcer of ankle - Primary    Wound has scabbed over. Did not replace unna boot. Should conitnue to heal well. Emphasized compression stocking use and elevation of legs.      Relevant Orders   Basic metabolic panel (Completed)   Chronic venous insufficiency    With nonpitting pedal edema and persistent R leg pain. Continue to monitor with lasix 20mg  QOD + KDur. Check BMP today.      Relevant Orders   Ambulatory referral to Vascular Surgery    Other Visit Diagnoses    Right leg pain  Relevant Orders    Ambulatory referral to Vascular Surgery        Follow up plan: Return if symptoms worsen or fail to improve.

## 2014-12-23 NOTE — Assessment & Plan Note (Signed)
Wound has scabbed over. Did not replace unna boot. Should conitnue to heal well. Emphasized compression stocking use and elevation of legs.

## 2014-12-23 NOTE — Addendum Note (Signed)
Addended by: [name] on: 12/23/2014 10:21 PM   Modules accepted: Orders

## 2014-12-24 ENCOUNTER — Encounter: Payer: Self-pay | Admitting: *Deleted

## 2015-01-10 ENCOUNTER — Encounter: Payer: Self-pay | Admitting: Vascular Surgery

## 2015-01-11 ENCOUNTER — Ambulatory Visit (HOSPITAL_COMMUNITY)
Admission: RE | Admit: 2015-01-11 | Discharge: 2015-01-11 | Disposition: A | Discharge status: Home / Self Care | Payer: Medicare Other | Source: Ambulatory Visit | Attending: Vascular Surgery | Admitting: Vascular Surgery

## 2015-01-11 ENCOUNTER — Ambulatory Visit (INDEPENDENT_AMBULATORY_CARE_PROVIDER_SITE_OTHER): Payer: Medicare Other | Admitting: Vascular Surgery

## 2015-01-11 ENCOUNTER — Other Ambulatory Visit: Payer: Self-pay | Admitting: *Deleted

## 2015-01-11 ENCOUNTER — Encounter: Payer: Self-pay | Admitting: Vascular Surgery

## 2015-01-11 VITALS — BP 148/88 | HR 75 | Resp 16 | Ht 63.0 in | Wt 218.0 lb

## 2015-01-11 DIAGNOSIS — I83893 Varicose veins of bilateral lower extremities with other complications: Secondary | ICD-10-CM | POA: Diagnosis not present

## 2015-01-11 DIAGNOSIS — I83892 Varicose veins of left lower extremities with other complications: Secondary | ICD-10-CM

## 2015-01-11 NOTE — Progress Notes (Addendum)
Subjective:     Patient ID: [name], female   DOB: April 14, 1946, 69 y.o.   MRN: [id number]  HPI this 69 year old female returns for continued follow-up regarding her painful varicosities in the left leg and venous stasis ulcer in the left ankle. The ulcer has been present since December 2015 and is improving but not healed. She continues to have painful varicosities in the left anterior and lateral thigh. Elastic compression stockings will not fit her upper leg and she was use compression wraps for this area. She has tried ibuprofen and elevation with no improvement as well as the lower leg compressions for the ulcer. She has previously had laser ablation of the left great saphenous vein in [place] in 2010 and has known reflux in the anterior accessory branch of the left great saphenous vein which is supplying painful varicosities.  Past Medical History  Diagnosis Date  . Hypertension   . DVT (deep venous thrombosis) 2011    small, developed after venous ablation  . Tinnitus   . Asthma in adult   . Chronic venous insufficiency 2016    severe reflux with painful varicosities sees [facility]  . Melanoma 06/29/10    MALIGNANT MELANOMA R SHOULDER/SUPRASCAPULAR BACK s/p interferon chemo and XRT  . History of radiation therapy 07/19/11-08/25/2011    RIGHT AXILLARY REGION/METASTATIC  . History of kidney stones 1980s  . Neuromuscular disorder     muscle spasms  . Arthritis   . Seasonal allergies   . OSA (obstructive sleep apnea)     on CPAP  . RLS (restless legs syndrome)   . Anesthesia complication     trouble waking up 2/2 CPAP  . Unspecified vitamin D deficiency 03/18/2013  . Hearing loss sensory, bilateral 2013    mod-severe high freq sensorineural ([facility])  . Cervical spondylosis 2012    [facility])    History  Substance Use Topics  . Smoking status: Former Smoker    Types: Cigarettes    Quit date: 11/05/1965  . Smokeless tobacco: Never Used     Comment: socially as a teen  .  Alcohol Use: Yes     Comment: drinks wine occasionally    Family History  Problem Relation Age of Onset  . Cancer Mother 66    uterine  . Cancer Cousin     x2, breast  . CAD Father 41    MI  . Hypertension Father 60  . Diabetes Sister   . Cancer Paternal Grandmother     melanoma, possibly  . Alzheimer's disease Father 46    Allergies  Allergen Reactions  . Sulfa Drugs Cross Reactors Swelling    Sulfa medications-swelling of face     Current outpatient prescriptions:  .  Acetaminophen (TYLENOL ARTHRITIS EXT RELIEF PO), Take 1 tablet by mouth daily as needed., Disp: , Rfl:  .  albuterol (PROAIR HFA) 108 (90 BASE) MCG/ACT inhaler, Inhale 2 puffs into the lungs every 6 (six) hours as needed for wheezing or shortness of breath., Disp: 1 Inhaler, Rfl: 6 .  aspirin 81 MG tablet, Take 81 mg by mouth daily., Disp: , Rfl:  .  furosemide (LASIX) 20 MG tablet, Take 1 tablet (20 mg total) by mouth daily as needed for fluid. (Patient taking differently: Take 20 mg by mouth every other day. ), Disp: 30 tablet, Rfl: 3 .  KLOR-CON M10 10 MEQ tablet, TAKE 2 TABLETS BY MOUTH DAILY., Disp: 90 tablet, Rfl: 3 .  loratadine (CLARITIN) 10 MG tablet, Take  10 mg by mouth daily as needed.  , Disp: , Rfl:  .  losartan-hydrochlorothiazide (HYZAAR) 50-12.5 MG per tablet, TAKE 1 TABLET BY MOUTH DAILY., Disp: 30 tablet, Rfl: 6 .  montelukast (SINGULAIR) 10 MG tablet, TAKE 1 TABLET BY MOUTH DAILY, Disp: 30 tablet, Rfl: 11 .  Multiple Vitamins-Minerals (MULTIVITAMIN PO), Take by mouth every morning., Disp: , Rfl:  .  Probiotic Product (PROBIOTIC DAILY PO), Take by mouth every morning., Disp: , Rfl:  .  Vitamin D, Ergocalciferol, (DRISDOL) 50000 UNITS CAPS capsule, Take 1 capsule (50,000 Units total) by mouth every 7 (seven) days., Disp: 4 capsule, Rfl: 5  BP 148/88 mmHg  Pulse 75  Resp 16  Ht 5\' 3"  (1.6 m)  Wt 218 lb (98.884 kg)  BMI 38.63 kg/m2  Body mass index is 38.63  kg/(m^2).           Review of Systems denies chest pain, dyspnea on exertion, PND, orthopnea    Objective:   Physical Exam BP 148/88 mmHg  Pulse 75  Resp 16  Ht 5\' 3"  (1.6 m)  Wt 218 lb (98.884 kg)  BMI 38.63 kg/m2  Gen. well-developed well-nourished female no apparent distress alert and oriented 3 Left leg with bulging varicosities in anterior mid thigh extending laterally lateral to knee. There is healing ulceration proximal to lateral malleolus measuring 6 mm in diameter. There is 1+ chronic edema distally with 3+ dorsalis pedis pulse palpable.  Today I reviewed the duplex scan and performed an independent sono site ultrasound exam and she does have a large anterior accessory branch of the great saphenous vein supplying these painful varicosities and also had duplex scan of the right lower extremity for the chronic edema and pain she is suffering and has no evidence of reflux in the great or small saphenous vein and no DVT    Assessment:     Painful varicosities left leg with slowly healing stasis ulcer left ankle due to gross reflux anterior accessory branch left great saphenous vein    Plan:     Patient needs laser ablation left great saphenous vein-anterior accessory branch to relieve symptoms of pain and swelling and to assist in healing of stasis ulcer. She would then return in 3 months to see if stab phlebectomy would be necessary for residual varicosities. Will proceed with precertification to perform this in the near future.

## 2015-01-18 ENCOUNTER — Other Ambulatory Visit: Payer: Self-pay | Admitting: *Deleted

## 2015-01-18 DIAGNOSIS — I83892 Varicose veins of left lower extremities with other complications: Secondary | ICD-10-CM

## 2015-01-25 ENCOUNTER — Encounter: Payer: Self-pay | Admitting: Family Medicine

## 2015-01-25 ENCOUNTER — Ambulatory Visit (INDEPENDENT_AMBULATORY_CARE_PROVIDER_SITE_OTHER): Payer: Medicare Other | Admitting: Family Medicine

## 2015-01-25 VITALS — BP 108/64 | HR 68 | Temp 98.1°F | Wt 217.0 lb

## 2015-01-25 DIAGNOSIS — I83223 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 of ankle and inflammation: Secondary | ICD-10-CM

## 2015-01-25 DIAGNOSIS — I83023 Varicose veins of left lower extremity with ulcer of ankle: Secondary | ICD-10-CM

## 2015-01-25 DIAGNOSIS — I83224 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 of heel and midfoot and inflammation: Secondary | ICD-10-CM

## 2015-01-25 DIAGNOSIS — I83225 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 other part of foot and inflammation: Secondary | ICD-10-CM

## 2015-01-25 DIAGNOSIS — I83228 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 of other part of lower extremity and inflammation: Secondary | ICD-10-CM | POA: Diagnosis not present

## 2015-01-25 DIAGNOSIS — I83221 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 of thigh and inflammation: Secondary | ICD-10-CM | POA: Diagnosis not present

## 2015-01-25 DIAGNOSIS — L97329 Non-pressure chronic ulcer of left ankle with unspecified severity: Principal | ICD-10-CM

## 2015-01-25 DIAGNOSIS — R0989 Other specified symptoms and signs involving the circulatory and respiratory systems: Secondary | ICD-10-CM

## 2015-01-25 DIAGNOSIS — I83222 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 of calf and inflammation: Secondary | ICD-10-CM | POA: Diagnosis not present

## 2015-01-25 DIAGNOSIS — I83229 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 of unspecified site and inflammation: Secondary | ICD-10-CM | POA: Diagnosis not present

## 2015-01-25 NOTE — Progress Notes (Signed)
BP 108/64 mmHg  Pulse 68  Temp(Src) 98.1 F (36.7 C) (Oral)  Wt 217 lb (98.431 kg)   CC: check L leg wound  Subjective:    Patient ID: [name], female    DOB: 1946/04/03, 69 y.o.   MRN: [id number]  HPI: [name] is a 69 y.o. female presenting on 01/25/2015 for Wound Check   See prior note for details. H/o venous insuff L leg ulcer s/p treatment with unna boot. Referred back to [facility] for persistent R leg pain and swelling - [place] R leg showed no reflux or DVT, and [place] L leg showed large anterior accessory branch of great saphenous vein supplying painful varicosities. Has been recommended laser ablation of L great saphenous vein. This is scheduled April 11th.   Persistent L leg pain described as burning. Woke up on Sunday with significant pain and itching at site of prior L lateral lower leg ulcer. Worsening over last 2 days.   She takes lasix 20mg  QOD + Kdur.   Relevant past medical, surgical, family and social history reviewed and updated as indicated. Interim medical history since our last visit reviewed. Allergies and medications reviewed and updated. Current Outpatient Prescriptions on File Prior to Visit  Medication Sig  . Acetaminophen (TYLENOL ARTHRITIS EXT RELIEF PO) Take 1 tablet by mouth daily as needed.  Marland Kitchen albuterol (PROAIR HFA) 108 (90 BASE) MCG/ACT inhaler Inhale 2 puffs into the lungs every 6 (six) hours as needed for wheezing or shortness of breath.  Marland Kitchen aspirin 81 MG tablet Take 81 mg by mouth daily.  . furosemide (LASIX) 20 MG tablet Take 1 tablet (20 mg total) by mouth daily as needed for fluid. (Patient taking differently: Take 20 mg by mouth every other day. )  . KLOR-CON M10 10 MEQ tablet TAKE 2 TABLETS BY MOUTH DAILY.  Marland Kitchen loratadine (CLARITIN) 10 MG tablet Take 10 mg by mouth daily as needed.    Marland Kitchen losartan-hydrochlorothiazide (HYZAAR) 50-12.5 MG per tablet TAKE 1 TABLET BY MOUTH DAILY.  . montelukast (SINGULAIR) 10 MG tablet TAKE 1 TABLET BY MOUTH DAILY  .  Multiple Vitamins-Minerals (MULTIVITAMIN PO) Take by mouth every morning.  . Probiotic Product (PROBIOTIC DAILY PO) Take by mouth every morning.  . Vitamin D, Ergocalciferol, (DRISDOL) 50000 UNITS CAPS capsule Take 1 capsule (50,000 Units total) by mouth every 7 (seven) days.   No current facility-administered medications on file prior to visit.    Review of Systems Per HPI unless specifically indicated above     Objective:    BP 108/64 mmHg  Pulse 68  Temp(Src) 98.1 F (36.7 C) (Oral)  Wt 217 lb (98.431 kg)  Wt Readings from Last 3 Encounters:  01/25/15 217 lb (98.431 kg)  01/11/15 218 lb (98.884 kg)  12/23/14 217 lb 8 oz (98.657 kg)    Physical Exam  Constitutional: She appears well-developed and well-nourished. No distress.  obese  Musculoskeletal: She exhibits edema (1+ bilateral pitting edema).  2+ DP on right 1+ DP on left  Skin: Skin is warm and dry. No rash noted.  1cm venous ulcer present lateral left lower leg with granulation tissue present  Psychiatric: She has a normal mood and affect.  Nursing note and vitals reviewed.      Assessment & Plan:   Problem List Items Addressed This Visit    Venous stasis ulcer of ankle - Primary    Recurrent ulcer of lower extremity that has enlarged to 1cm (prior 38mm in size). Replaced unna  boot today L lower extremity. Continue lasix 20mg  QOD, Kdur, and elevation of legs. RTC 1 wk f/u visit. Pt agrees with plan. Given mildly diminished pulses noted on exam today L foot as well as recurrent and poorly healing wound of lower extremity, will also order ABI to r/o arterial insufficiency prior to upcoming surgery.      Relevant Orders   Ankle brachial index    Other Visit Diagnoses    Diminished pulses in lower extremity        Relevant Orders    Ankle brachial index        Follow up plan: Return in about 1 week (around 02/01/2015), or if symptoms worsen or fail to improve, for follow up visit.

## 2015-01-25 NOTE — Patient Instructions (Addendum)
We will place you in left unna boot again today for recurrent venous ulcer.  Keep legs elevated and continue lasix 20mg  every other day. Return in 1 week for follow up. We will check circulation of legs.

## 2015-01-25 NOTE — Progress Notes (Signed)
Pre visit review using our clinic review tool, if applicable. No additional management support is needed unless otherwise documented below in the visit note. 

## 2015-01-25 NOTE — Assessment & Plan Note (Signed)
Recurrent ulcer of lower extremity that has enlarged to 1cm (prior 17mm in size). Replaced unna boot today L lower extremity. Continue lasix 20mg  QOD, Kdur, and elevation of legs. RTC 1 wk f/u visit. Pt agrees with plan. Given mildly diminished pulses noted on exam today L foot as well as recurrent and poorly healing wound of lower extremity, will also order ABI to r/o arterial insufficiency prior to upcoming surgery.

## 2015-01-27 NOTE — Addendum Note (Signed)
Addended by: [name] on: 01/27/2015 02:11 PM   Modules accepted: Orders

## 2015-02-01 ENCOUNTER — Other Ambulatory Visit: Payer: Self-pay | Admitting: Family Medicine

## 2015-02-01 ENCOUNTER — Ambulatory Visit (INDEPENDENT_AMBULATORY_CARE_PROVIDER_SITE_OTHER): Payer: Medicare Other | Admitting: Family Medicine

## 2015-02-01 ENCOUNTER — Encounter: Payer: Self-pay | Admitting: Family Medicine

## 2015-02-01 VITALS — BP 138/80 | HR 72 | Temp 98.1°F | Wt 216.0 lb

## 2015-02-01 DIAGNOSIS — I83224 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 of heel and midfoot and inflammation: Secondary | ICD-10-CM | POA: Diagnosis not present

## 2015-02-01 DIAGNOSIS — I83023 Varicose veins of left lower extremity with ulcer of ankle: Secondary | ICD-10-CM

## 2015-02-01 DIAGNOSIS — I83223 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 of ankle and inflammation: Secondary | ICD-10-CM | POA: Diagnosis not present

## 2015-02-01 DIAGNOSIS — I83229 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 of unspecified site and inflammation: Secondary | ICD-10-CM

## 2015-02-01 DIAGNOSIS — L97329 Non-pressure chronic ulcer of left ankle with unspecified severity: Principal | ICD-10-CM

## 2015-02-01 DIAGNOSIS — I83222 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 of calf and inflammation: Secondary | ICD-10-CM | POA: Diagnosis not present

## 2015-02-01 DIAGNOSIS — I83221 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 of thigh and inflammation: Secondary | ICD-10-CM | POA: Diagnosis not present

## 2015-02-01 DIAGNOSIS — I83228 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 of other part of lower extremity and inflammation: Secondary | ICD-10-CM

## 2015-02-01 DIAGNOSIS — I872 Venous insufficiency (chronic) (peripheral): Secondary | ICD-10-CM

## 2015-02-01 DIAGNOSIS — I83225 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 other part of foot and inflammation: Secondary | ICD-10-CM

## 2015-02-01 NOTE — Assessment & Plan Note (Signed)
1cm ->61mm but persistent. Will not replace unna boot today in anticipation of upcoming ABI/arterial duplex. Continue elevation, lasix.

## 2015-02-01 NOTE — Progress Notes (Signed)
BP 138/80 mmHg  Pulse 72  Temp(Src) 98.1 F (36.7 C) (Oral)  Wt 216 lb (97.977 kg)  SpO2 95%   CC: f/u venous stasis ulcer  Subjective:    Patient ID: [name], female    DOB: 11/27/1945, 69 y.o.   MRN: [id number]  HPI: [name] is a 69 y.o. female presenting on 02/01/2015 for Follow-up    See prior note for details. H/o venous insuff L leg ulcer s/p treatment with unna boot. Referred back to [facility] for persistent R leg pain and swelling - [place] R leg showed no reflux or DVT, and [place] L leg showed large anterior accessory branch of great saphenous vein supplying painful varicosities. Has been recommended laser ablation of L great saphenous vein. This is scheduled April 11th.   Last visit we re applied LLE unna boot 2/2 enlargement of venous ulcer to 1cm (prior 87mm and scabbed).   We also ordered ABI given diminished LLE pulses.  Relevant past medical, surgical, family and social history reviewed and updated as indicated. Interim medical history since our last visit reviewed. Allergies and medications reviewed and updated. Current Outpatient Prescriptions on File Prior to Visit  Medication Sig  . Acetaminophen (TYLENOL ARTHRITIS EXT RELIEF PO) Take 1 tablet by mouth daily as needed.  Marland Kitchen albuterol (PROAIR HFA) 108 (90 BASE) MCG/ACT inhaler Inhale 2 puffs into the lungs every 6 (six) hours as needed for wheezing or shortness of breath.  Marland Kitchen aspirin 81 MG tablet Take 81 mg by mouth daily.  . furosemide (LASIX) 20 MG tablet Take 1 tablet (20 mg total) by mouth daily as needed for fluid. (Patient taking differently: Take 20 mg by mouth every other day. )  . KLOR-CON M10 10 MEQ tablet TAKE 2 TABLETS BY MOUTH DAILY.  Marland Kitchen loratadine (CLARITIN) 10 MG tablet Take 10 mg by mouth daily as needed.    Marland Kitchen losartan-hydrochlorothiazide (HYZAAR) 50-12.5 MG per tablet TAKE 1 TABLET BY MOUTH DAILY.  . montelukast (SINGULAIR) 10 MG tablet TAKE 1 TABLET BY MOUTH DAILY  . Multiple Vitamins-Minerals  (MULTIVITAMIN PO) Take by mouth every morning.  . Probiotic Product (PROBIOTIC DAILY PO) Take by mouth every morning.  . Vitamin D, Ergocalciferol, (DRISDOL) 50000 UNITS CAPS capsule Take 1 capsule (50,000 Units total) by mouth every 7 (seven) days.   No current facility-administered medications on file prior to visit.    Review of Systems Per HPI unless specifically indicated above     Objective:    BP 138/80 mmHg  Pulse 72  Temp(Src) 98.1 F (36.7 C) (Oral)  Wt 216 lb (97.977 kg)  SpO2 95%  Wt Readings from Last 3 Encounters:  02/01/15 216 lb (97.977 kg)  01/25/15 217 lb (98.431 kg)  01/11/15 218 lb (98.884 kg)    Physical Exam  Constitutional: She appears well-developed and well-nourished. No distress.  obese  Musculoskeletal: She exhibits edema (tr bilateral pitting edema).  2+ DP on right 1+ DP on left  Skin: Skin is warm and dry. No rash noted.  102mm scabbed ulcer present lateral left lower leg that is tender to touch  Psychiatric: She has a normal mood and affect.  Nursing note and vitals reviewed.     Assessment & Plan:   Problem List Items Addressed This Visit    Venous stasis ulcer of ankle - Primary    1cm ->56mm but persistent. Will not replace unna boot today in anticipation of upcoming ABI/arterial duplex. Continue elevation, lasix.  Chronic venous insufficiency    Upcoming vein procedure.          Follow up plan: No Follow-up on file.

## 2015-02-01 NOTE — Patient Instructions (Signed)
Wound is smaller but persists. No unna boot today. ABI study next week and we will contact you with results

## 2015-02-01 NOTE — Progress Notes (Signed)
Pre visit review using our clinic review tool, if applicable. No additional management support is needed unless otherwise documented below in the visit note. 

## 2015-02-01 NOTE — Assessment & Plan Note (Signed)
Upcoming vein procedure.

## 2015-02-03 ENCOUNTER — Other Ambulatory Visit (HOSPITAL_COMMUNITY): Payer: Self-pay | Admitting: Cardiology

## 2015-02-03 DIAGNOSIS — R0989 Other specified symptoms and signs involving the circulatory and respiratory systems: Secondary | ICD-10-CM

## 2015-02-08 ENCOUNTER — Ambulatory Visit (HOSPITAL_COMMUNITY): Payer: Medicare Other | Attending: Cardiovascular Disease | Admitting: *Deleted

## 2015-02-08 DIAGNOSIS — R0989 Other specified symptoms and signs involving the circulatory and respiratory systems: Secondary | ICD-10-CM | POA: Diagnosis not present

## 2015-02-08 DIAGNOSIS — I10 Essential (primary) hypertension: Secondary | ICD-10-CM | POA: Diagnosis not present

## 2015-02-08 DIAGNOSIS — J45909 Unspecified asthma, uncomplicated: Secondary | ICD-10-CM | POA: Diagnosis not present

## 2015-02-08 DIAGNOSIS — I83023 Varicose veins of left lower extremity with ulcer of ankle: Secondary | ICD-10-CM

## 2015-02-08 DIAGNOSIS — G4733 Obstructive sleep apnea (adult) (pediatric): Secondary | ICD-10-CM | POA: Insufficient documentation

## 2015-02-08 DIAGNOSIS — L97329 Non-pressure chronic ulcer of left ankle with unspecified severity: Secondary | ICD-10-CM

## 2015-02-08 NOTE — Progress Notes (Signed)
Lower Extremity Arterial Doppler - Limited

## 2015-02-10 ENCOUNTER — Encounter: Payer: Self-pay | Admitting: Family Medicine

## 2015-02-11 ENCOUNTER — Encounter: Payer: Self-pay | Admitting: Vascular Surgery

## 2015-02-14 ENCOUNTER — Encounter: Payer: Self-pay | Admitting: Vascular Surgery

## 2015-02-14 ENCOUNTER — Ambulatory Visit (INDEPENDENT_AMBULATORY_CARE_PROVIDER_SITE_OTHER): Payer: Medicare Other | Admitting: Vascular Surgery

## 2015-02-14 VITALS — BP 139/90 | HR 72 | Resp 16 | Ht 63.0 in | Wt 216.0 lb

## 2015-02-14 DIAGNOSIS — I83892 Varicose veins of left lower extremities with other complications: Secondary | ICD-10-CM

## 2015-02-14 NOTE — Progress Notes (Signed)
Laser Ablation Procedure    Date: 02/14/2015   [name] DOB:04-13-1946  Consent signed: Yes    Surgeon:  Dr. Nelda Severe. Kellie Simmering  Procedure: Laser Ablation: left Greater Saphenous Vein  BP 139/90 mmHg  Pulse 72  Resp 16  Ht 5\' 3"  (1.6 m)  Wt 216 lb (97.977 kg)  BMI 38.27 kg/m2  Tumescent Anesthesia: 200 cc 0.9% NaCl with 50 cc Lidocaine HCL with 1% Epi and 15 cc 8.4% NaHCO3  Local Anesthesia: 3 cc Lidocaine HCL and NaHCO3 (ratio 2:1)  Pulsed Mode: 15 watts, 526ms delay, 1.0 duration  Total Energy:  1032            Total Pulses:  69              Total Time: 1:09    Patient tolerated procedure well  Notes:   Description of Procedure:  After marking the course of the secondary varicosities, the patient was placed on the operating table in the supine position, and the left leg was prepped and draped in sterile fashion.   Local anesthetic was administered and under ultrasound guidance the saphenous vein was accessed with a micro needle and guide wire; then the mirco puncture sheath was placed.  A guide wire was inserted saphenofemoral junction , followed by a 5 french sheath.  The position of the sheath and then the laser fiber below the junction was confirmed using the ultrasound.  Tumescent anesthesia was administered along the course of the saphenous vein using ultrasound guidance. The patient was placed in Trendelenburg position and protective laser glasses were placed on patient and staff, and the laser was fired at 15 watts continuous mode advancing 1-40mm/second for a total of 1032 joules.     Steri strips were applied to the stab wounds and ABD pads and thigh high compression stockings were applied.  Ace wrap bandages were applied over the phlebectomy sites and at the top of the saphenofemoral junction. Blood loss was less than 15 cc.  The patient ambulated out of the operating room having tolerated the procedure well.

## 2015-02-14 NOTE — Progress Notes (Signed)
Subjective:     Patient ID: [name], female   DOB: 06/07/1946, 69 y.o.   MRN: [id number]  HPI this 69 year old female had laser ablation of the left great saphenous vein-anterior accessory branch from the distal thigh to near the saphenofemoral junction performed under local tumescent anesthesia. A total of 1032 J of energy was utilized. She tolerated the procedure well.   Review of Systems     Objective:   Physical Exam BP 139/90 mmHg  Pulse 72  Resp 16  Ht 5\' 3"  (1.6 m)  Wt 216 lb (97.977 kg)  BMI 38.27 kg/m2       Assessment:     Well-tolerated laser ablation anterior accessory branch left great saphenous vein performed under local tumescent anesthesia for an healed venous stasis ulcer with previous history of laser ablation left great saphenous vein at another facility    Plan:     Return in one week for venous duplex exam to confirm closure anterior accessory branch left great saphenous vein Patient will then continue Unna boot therapy by her medical doctor and if the ulcer does not heal return to the wound center

## 2015-02-15 ENCOUNTER — Telehealth: Payer: Self-pay | Admitting: *Deleted

## 2015-02-15 NOTE — Telephone Encounter (Signed)
Pt doing well. Following all instructions. Just a little pain that "feels like a bruise". Reminded her of her fu appts.

## 2015-02-18 ENCOUNTER — Encounter: Payer: Self-pay | Admitting: Vascular Surgery

## 2015-02-21 ENCOUNTER — Other Ambulatory Visit: Payer: Medicare Other | Admitting: Vascular Surgery

## 2015-02-21 ENCOUNTER — Ambulatory Visit (HOSPITAL_COMMUNITY)
Admission: RE | Admit: 2015-02-21 | Discharge: 2015-02-21 | Disposition: A | Discharge status: Home / Self Care | Payer: Medicare Other | Source: Ambulatory Visit | Attending: Vascular Surgery | Admitting: Vascular Surgery

## 2015-02-21 ENCOUNTER — Encounter: Payer: Self-pay | Admitting: Vascular Surgery

## 2015-02-21 ENCOUNTER — Ambulatory Visit (INDEPENDENT_AMBULATORY_CARE_PROVIDER_SITE_OTHER): Payer: Medicare Other | Admitting: Vascular Surgery

## 2015-02-21 VITALS — BP 150/91 | HR 69 | Resp 18 | Ht 63.0 in | Wt 216.0 lb

## 2015-02-21 DIAGNOSIS — I83892 Varicose veins of left lower extremities with other complications: Secondary | ICD-10-CM

## 2015-02-21 DIAGNOSIS — Z48812 Encounter for surgical aftercare following surgery on the circulatory system: Secondary | ICD-10-CM | POA: Insufficient documentation

## 2015-02-21 NOTE — Progress Notes (Signed)
Subjective:     Patient ID: [name], female   DOB: 11-19-1945, 70 y.o.   MRN: [id number]  HPI this 69 year old female returns 1 week post laser ablation anterior accessory branch left great saphenous vein for venous stasis ulcer. She had previously undergone laser ablation of the left great saphenous vein. She states that the ulcer is about the same as previously. She does state that the leg is less "tight" than before. She had been treated with an Haematologist by her medical doctor prior to the procedure but had never gone to the wound center. She did take her ibuprofen and wear her stockings as instructed. She has had mild discomfort where the vein was ablated.  Past Medical History  Diagnosis Date  . Hypertension   . DVT (deep venous thrombosis) 2011    small, developed after venous ablation  . Tinnitus   . Asthma in adult   . Chronic venous insufficiency 2016    severe reflux with painful varicosities sees [facility]  . Melanoma 06/29/10    MALIGNANT MELANOMA R SHOULDER/SUPRASCAPULAR BACK s/p interferon chemo and XRT  . History of radiation therapy 07/19/11-08/25/2011    RIGHT AXILLARY REGION/METASTATIC  . History of kidney stones 1980s  . Neuromuscular disorder     muscle spasms  . Arthritis   . Seasonal allergies   . OSA (obstructive sleep apnea)     on CPAP  . RLS (restless legs syndrome)   . Anesthesia complication     trouble waking up 2/2 CPAP  . Unspecified vitamin D deficiency 03/18/2013  . Hearing loss sensory, bilateral 2013    mod-severe high freq sensorineural ([facility])  . Cervical spondylosis 2012    [facility])    History  Substance Use Topics  . Smoking status: Former Smoker    Types: Cigarettes    Quit date: 11/05/1965  . Smokeless tobacco: Never Used     Comment: socially as a teen  . Alcohol Use: Yes     Comment: drinks wine occasionally    Family History  Problem Relation Age of Onset  . Cancer Mother 40    uterine  . Cancer Cousin     x2, breast   . CAD Father 29    MI  . Hypertension Father 23  . Diabetes Sister   . Cancer Paternal Grandmother     melanoma, possibly  . Alzheimer's disease Father 38    Allergies  Allergen Reactions  . Sulfa Drugs Cross Reactors Swelling    Sulfa medications-swelling of face     Current outpatient prescriptions:  .  Acetaminophen (TYLENOL ARTHRITIS EXT RELIEF PO), Take 1 tablet by mouth daily as needed., Disp: , Rfl:  .  albuterol (PROAIR HFA) 108 (90 BASE) MCG/ACT inhaler, Inhale 2 puffs into the lungs every 6 (six) hours as needed for wheezing or shortness of breath., Disp: 1 Inhaler, Rfl: 6 .  aspirin 81 MG tablet, Take 81 mg by mouth daily., Disp: , Rfl:  .  furosemide (LASIX) 20 MG tablet, Take 1 tablet (20 mg total) by mouth daily as needed for fluid. (Patient taking differently: Take 20 mg by mouth every other day. ), Disp: 30 tablet, Rfl: 3 .  KLOR-CON M10 10 MEQ tablet, TAKE 2 TABLETS BY MOUTH DAILY., Disp: 90 tablet, Rfl: 3 .  loratadine (CLARITIN) 10 MG tablet, Take 10 mg by mouth daily as needed.  , Disp: , Rfl:  .  losartan-hydrochlorothiazide (HYZAAR) 50-12.5 MG per tablet, TAKE 1 TABLET BY  MOUTH DAILY., Disp: 30 tablet, Rfl: 6 .  montelukast (SINGULAIR) 10 MG tablet, TAKE 1 TABLET BY MOUTH DAILY, Disp: 30 tablet, Rfl: 11 .  Multiple Vitamins-Minerals (MULTIVITAMIN PO), Take by mouth every morning., Disp: , Rfl:  .  Probiotic Product (PROBIOTIC DAILY PO), Take by mouth every morning., Disp: , Rfl:  .  Vitamin D, Ergocalciferol, (DRISDOL) 50000 UNITS CAPS capsule, Take 1 capsule (50,000 Units total) by mouth every 7 (seven) days., Disp: 4 capsule, Rfl: 5  Filed Vitals:   02/21/15 1435 02/21/15 1436  BP: 156/91 150/91  Pulse: 68 69  Resp: 16 18  Height: 5\' 3"  (1.6 m)   Weight: 216 lb (97.977 kg)     Body mass index is 38.27 kg/(m^2).             Review of Systems denies chest pain, dyspnea on exertion, PND, orthopnea, hemoptysis.     Objective:   Physical  Exam BP 150/91 mmHg  Pulse 69  Resp 18  Ht 5\' 3"  (1.6 m)  Wt 216 lb (97.977 kg)  BMI 38.27 kg/m2  Gen. well-developed well-nourished female no apparent stress alert and oriented 3 Lungs no rhonchi or wheezing Cardiovascular regular rhythm no murmurs Left leg with mild discomfort to palpation of great saphenous vein in mid thigh. Minimal distal edema noted. Bulging varicosities on lateral aspect of thigh and lateral calf. Ulcers 5 mm in diameter proximal to lateral malleolus clean and not infected.  Today I ordered a venous duplex exam which are reviewed and interpreted. There is no DVT. There is good closure of the great saphenous vein up to near the saphenofemoral junction.     Assessment:     Successful laser ablation left great saphenous vein with history of stasis ulcer lateral leg treated with Unna boots-slowly healing Patient also has painful varicosities    Plan:     Return in 3 months for reevaluation for possible stab phlebectomy of painful varicosities and to assess status of venous ulcer Patient will continue to be managed for ulcer by her medical doctor with Louretta Parma boot therapy and or referral to wound center

## 2015-02-21 NOTE — Progress Notes (Signed)
Filed Vitals:   02/21/15 1435 02/21/15 1436  BP: 156/91 150/91  Pulse: 68 69  Resp: 16 18  Height: 5\' 3"  (1.6 m)   Weight: 216 lb (97.977 kg)

## 2015-02-22 ENCOUNTER — Encounter (HOSPITAL_COMMUNITY): Payer: Medicare Other

## 2015-02-22 ENCOUNTER — Ambulatory Visit: Payer: Medicare Other | Admitting: Vascular Surgery

## 2015-03-01 ENCOUNTER — Encounter (HOSPITAL_COMMUNITY): Payer: Medicare Other

## 2015-03-01 ENCOUNTER — Ambulatory Visit: Payer: Medicare Other | Admitting: Vascular Surgery

## 2015-03-04 ENCOUNTER — Ambulatory Visit (INDEPENDENT_AMBULATORY_CARE_PROVIDER_SITE_OTHER): Payer: Medicare Other | Admitting: Family Medicine

## 2015-03-04 ENCOUNTER — Encounter: Payer: Self-pay | Admitting: Family Medicine

## 2015-03-04 VITALS — BP 128/76 | HR 80 | Temp 98.3°F | Wt 216.5 lb

## 2015-03-04 DIAGNOSIS — I83223 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 of ankle and inflammation: Secondary | ICD-10-CM

## 2015-03-04 DIAGNOSIS — I83228 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 of other part of lower extremity and inflammation: Secondary | ICD-10-CM

## 2015-03-04 DIAGNOSIS — L97329 Non-pressure chronic ulcer of left ankle with unspecified severity: Principal | ICD-10-CM

## 2015-03-04 DIAGNOSIS — I83222 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 of calf and inflammation: Secondary | ICD-10-CM

## 2015-03-04 DIAGNOSIS — I83221 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 of thigh and inflammation: Secondary | ICD-10-CM | POA: Diagnosis not present

## 2015-03-04 DIAGNOSIS — I83224 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 of heel and midfoot and inflammation: Secondary | ICD-10-CM | POA: Diagnosis not present

## 2015-03-04 DIAGNOSIS — J309 Allergic rhinitis, unspecified: Secondary | ICD-10-CM

## 2015-03-04 DIAGNOSIS — I83229 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 of unspecified site and inflammation: Secondary | ICD-10-CM

## 2015-03-04 DIAGNOSIS — I83225 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 other part of foot and inflammation: Secondary | ICD-10-CM

## 2015-03-04 DIAGNOSIS — I83023 Varicose veins of left lower extremity with ulcer of ankle: Secondary | ICD-10-CM

## 2015-03-04 NOTE — Assessment & Plan Note (Signed)
Persistent very tender ulcer ongoing over last 4 months, has failed to heal despite repeated unna boots, laser vein ablation for CVI. Area cleaned with sterile water and duoderm artificial skin placed over wound. Will refer to wound clinic for poorly healing wound. Pt agrees.

## 2015-03-04 NOTE — Assessment & Plan Note (Signed)
Anticipate allergic cough that is slowly improving. Treat with continued nasacort and change antihistamine to zyrtec. Update if new sxs like fever >101 or productive cough.

## 2015-03-04 NOTE — Patient Instructions (Signed)
Pass by Marion's office for referral to wound center. duoderm placed today. May replace in 3-5 days. Continue nasacort and change claritin to zyrtec. Let us know if not improving with treatment.

## 2015-03-04 NOTE — Progress Notes (Signed)
BP 128/76 mmHg  Pulse 80  Temp(Src) 98.3 F (36.8 C) (Oral)  Wt 216 lb 8 oz (98.204 kg)   CC: f/u vein surgery, cough Subjective:    Patient ID: [name], female    DOB: 1946-02-13, 69 y.o.   MRN: [id number]  HPI: [name] is a 69 y.o. female presenting on 03/04/2015 for Follow-up and Cough   S/p laser ablation of anterior accessory branch of L great saphenous vein this month.   She has been using compression stockings regularly. Actually has been keeping stockings on several days in a row.   Each day ulcer seems to be improving, but this is a chronic ulcer that has been present since Christmas 2015.  Recent ABIs were normal.  Allergic rhinitis - started several days ago. She has had coughing fits which are controlled by albuterol inhaler. Dry hacking cough. No fevers/chills, congestion, PNdrainage, rhinorrhea, HA.   Relevant past medical, surgical, family and social history reviewed and updated as indicated. Interim medical history since our last visit reviewed. Allergies and medications reviewed and updated. Current Outpatient Prescriptions on File Prior to Visit  Medication Sig  . Acetaminophen (TYLENOL ARTHRITIS EXT RELIEF PO) Take 1 tablet by mouth daily as needed.  Marland Kitchen albuterol (PROAIR HFA) 108 (90 BASE) MCG/ACT inhaler Inhale 2 puffs into the lungs every 6 (six) hours as needed for wheezing or shortness of breath.  Marland Kitchen aspirin 81 MG tablet Take 81 mg by mouth daily.  . furosemide (LASIX) 20 MG tablet Take 1 tablet (20 mg total) by mouth daily as needed for fluid. (Patient taking differently: Take 20 mg by mouth every other day. )  . KLOR-CON M10 10 MEQ tablet TAKE 2 TABLETS BY MOUTH DAILY.  Marland Kitchen loratadine (CLARITIN) 10 MG tablet Take 10 mg by mouth daily as needed.    Marland Kitchen losartan-hydrochlorothiazide (HYZAAR) 50-12.5 MG per tablet TAKE 1 TABLET BY MOUTH DAILY.  . montelukast (SINGULAIR) 10 MG tablet TAKE 1 TABLET BY MOUTH DAILY  . Multiple Vitamins-Minerals  (MULTIVITAMIN PO) Take by mouth every morning.  . Probiotic Product (PROBIOTIC DAILY PO) Take by mouth every morning.  . Vitamin D, Ergocalciferol, (DRISDOL) 50000 UNITS CAPS capsule Take 1 capsule (50,000 Units total) by mouth every 7 (seven) days.   No current facility-administered medications on file prior to visit.    Review of Systems Per HPI unless specifically indicated above     Objective:    BP 128/76 mmHg  Pulse 80  Temp(Src) 98.3 F (36.8 C) (Oral)  Wt 216 lb 8 oz (98.204 kg)  Wt Readings from Last 3 Encounters:  03/04/15 216 lb 8 oz (98.204 kg)  02/21/15 216 lb (97.977 kg)  02/14/15 216 lb (97.977 kg)    Physical Exam  Constitutional: She appears well-developed and well-nourished. No distress.  obese  HENT:  Head: Normocephalic and atraumatic.  Right Ear: Hearing, tympanic membrane, external ear and ear canal normal.  Left Ear: Hearing, tympanic membrane, external ear and ear canal normal.  Nose: No mucosal edema or rhinorrhea. Right sinus exhibits no maxillary sinus tenderness and no frontal sinus tenderness. Left sinus exhibits no maxillary sinus tenderness and no frontal sinus tenderness.  Mouth/Throat: Uvula is midline, oropharynx is clear and moist and mucous membranes are normal. No oropharyngeal exudate, posterior oropharyngeal edema, posterior oropharyngeal erythema or tonsillar abscesses.  Eyes: Conjunctivae and EOM are normal. Pupils are equal, round, and reactive to light. No scleral icterus.  Neck: Normal range of motion. Neck supple.  Cardiovascular: Normal rate, regular rhythm, normal heart sounds and intact distal pulses.   No murmur heard. Pulmonary/Chest: Effort normal and breath sounds normal. No respiratory distress. She has no wheezes. She has no rales.  Musculoskeletal: She exhibits edema (tr bilateral pitting edema).  DP present bilaterally  Lymphadenopathy:    She has no cervical adenopathy.  Skin: Skin is warm and dry. No rash noted.  54mm  shallow erosion ulcer present lateral left lower leg that is tender to touch. No surrounding erythema  Psychiatric: She has a normal mood and affect.  Nursing note and vitals reviewed.     Assessment & Plan:   Problem List Items Addressed This Visit    Venous stasis ulcer of ankle - Primary    Persistent very tender ulcer ongoing over last 4 months, has failed to heal despite repeated unna boots, laser vein ablation for CVI. Area cleaned with sterile water and duoderm artificial skin placed over wound. Will refer to wound clinic for poorly healing wound. Pt agrees.      Relevant Orders   Ambulatory referral to Wound Clinic   Allergic rhinitis    Anticipate allergic cough that is slowly improving. Treat with continued nasacort and change antihistamine to zyrtec. Update if new sxs like fever >101 or productive cough.          Follow up plan: Return if symptoms worsen or fail to improve.

## 2015-03-04 NOTE — Progress Notes (Signed)
Pre visit review using our clinic review tool, if applicable. No additional management support is needed unless otherwise documented below in the visit note. 

## 2015-03-09 ENCOUNTER — Encounter: Payer: Medicare Other | Attending: Surgery | Admitting: Surgery

## 2015-03-09 ENCOUNTER — Other Ambulatory Visit: Payer: Self-pay | Admitting: Family Medicine

## 2015-03-09 DIAGNOSIS — L97229 Non-pressure chronic ulcer of left calf with unspecified severity: Secondary | ICD-10-CM | POA: Insufficient documentation

## 2015-03-09 DIAGNOSIS — I83222 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 of calf and inflammation: Secondary | ICD-10-CM | POA: Insufficient documentation

## 2015-03-09 DIAGNOSIS — R600 Localized edema: Secondary | ICD-10-CM | POA: Diagnosis not present

## 2015-03-09 DIAGNOSIS — I872 Venous insufficiency (chronic) (peripheral): Secondary | ICD-10-CM | POA: Diagnosis not present

## 2015-03-09 DIAGNOSIS — Z85820 Personal history of malignant melanoma of skin: Secondary | ICD-10-CM | POA: Insufficient documentation

## 2015-03-09 DIAGNOSIS — Z87891 Personal history of nicotine dependence: Secondary | ICD-10-CM | POA: Diagnosis not present

## 2015-03-09 DIAGNOSIS — J45909 Unspecified asthma, uncomplicated: Secondary | ICD-10-CM | POA: Diagnosis not present

## 2015-03-09 NOTE — Progress Notes (Addendum)
Amber, [name] ([id number]) Visit Report for 03/09/2015 Allergy List Details Patient Name: [name], [name]. Date of Service: 03/09/2015 2:30 PM Medical Record Number: [id number] Patient Account Number: [id number] Date of Birth/Sex: 1946-07-29 (69 y.o. Female) Treating RN: [name] Primary Care Physician: [name] Other Clinician: Referring Physician: [name] Treating Physician/Extender: BURNS, Charlean Sanfilippo in Treatment: 0 Allergies Active Allergies Sulfa drugs Reaction: facial swelling Allergy Notes Electronic Signature(s) Signed: 03/09/2015 4:07:52 PM By: [name] RN Entered By: [name] on 03/09/2015 14:19:07 [name] ([id number]) -------------------------------------------------------------------------------- Arrival Information Details Patient Name: [name], [name]. Date of Service: 03/09/2015 2:30 PM Medical Record Number: [id number] Patient Account Number: [id number] Date of Birth/Sex: [birth year] (69 y.o. Female) Treating RN: [name] Primary Care Physician: [name] Other Clinician: Referring Physician: [name] Treating Physician/Extender: BURNS, Charlean Sanfilippo in Treatment: 0 Visit Information Patient Arrived: Ambulatory Arrival Time: 14:11 Accompanied By: self Transfer Assistance: None Patient Identification Verified: Yes Secondary Verification Process Yes Completed: Patient Has Alerts: Yes Patient Alerts: NO BPs (R) arm 03/09/2015 ABI: (L) 1.29 (R) 1.29 Electronic Signature(s) Signed: 04/13/2015 1:38:20 PM By: [name], RN, [name], [name], [name] Previous Signature: 03/09/2015 4:07:52 PM Version By: [name] RN Entered By: [name] RN, [name], Kim on 04/13/2015 13:38:19 [name] ([id number]) -------------------------------------------------------------------------------- Clinic Level of Care Assessment Details Patient Name: [name], [name]. Date of Service: 03/09/2015 2:30 PM Medical Record Number:  [id number] Patient Account Number: [id number] Date of Birth/Sex: Apr 19, 1946 (69 y.o. Female) Treating RN: [name] Primary Care Physician: [name] Other Clinician: Referring Physician: [name] Treating Physician/Extender: BURNS, Charlean Sanfilippo in Treatment: 0 Clinic Level of Care Assessment Items TOOL 1 Quantity Score []  - Use when EandM and Procedure is performed on INITIAL visit 0 ASSESSMENTS - Nursing Assessment / Reassessment []  - General Physical Exam (combine w/ comprehensive assessment (listed just 0 below) when performed on new pt. evals) X - Comprehensive Assessment (HX, ROS, Risk Assessments, Wounds Hx, etc.) 1 25 ASSESSMENTS - Wound and Skin Assessment / Reassessment []  - Dermatologic / Skin Assessment (not related to wound area) 0 ASSESSMENTS - Ostomy and/or Continence Assessment and Care []  - Incontinence Assessment and Management 0 []  - Ostomy Care Assessment and Management (repouching, etc.) 0 PROCESS - Coordination of Care X - Simple Patient / Family Education for ongoing care 1 15 []  - Complex (extensive) Patient / Family Education for ongoing care 0 X - Staff obtains Programmer, systems, Records, Test Results / Process Orders 1 10 []  - Staff telephones HHA, Nursing Homes / Clarify orders / etc 0 []  - Routine Transfer to another Facility (non-emergent condition) 0 []  - Routine Hospital Admission (non-emergent condition) 0 X - New Admissions / Biomedical engineer / Ordering NPWT, Apligraf, etc. 1 15 []  - Emergency Hospital Admission (emergent condition) 0 PROCESS - Special Needs []  - Pediatric / Minor Patient Management 0 []  - Isolation Patient Management 0 [name], [name] ([id number]) []  - Hearing / Language / Visual special needs 0 []  - Assessment of Community assistance (transportation, D/C planning, etc.) 0 []  - Additional assistance / Altered mentation 0 []  - Support Surface(s) Assessment (bed, cushion, seat, etc.) 0 INTERVENTIONS -  Miscellaneous []  - External ear exam 0 []  - Patient Transfer (multiple staff / Civil Service fast streamer / Similar devices) 0 []  - Simple Staple / Suture removal (25 or less) 0 []  - Complex Staple / Suture removal (26 or more) 0 []  - Hypo/Hyperglycemic Management (do not check if billed separately) 0 X - Ankle / Brachial Index (  ABI) - do not check if billed separately 1 15 Has the patient been seen at the hospital within the last three years: Yes Total Score: 80 Level Of Care: New/Established - Level 3 Electronic Signature(s) Signed: 03/09/2015 4:07:52 PM By: [name] RN Entered By: [name] on 03/09/2015 15:14:12 [name] ([id number]) -------------------------------------------------------------------------------- Encounter Discharge Information Details Patient Name: [name], [name]. Date of Service: 03/09/2015 2:30 PM Medical Record Number: [id number] Patient Account Number: [id number] Date of Birth/Sex: 05-Feb-1946 (69 y.o. Female) Treating RN: [name], RN, [name], Velva Harman Primary Care Physician: [name] Other Clinician: Referring Physician: [name] Treating Physician/Extender: BURNS, Charlean Sanfilippo in Treatment: 0 Encounter Discharge Information Items Schedule Follow-up Appointment: No Medication Reconciliation completed No and provided to Patient/Care [name]: Provided on Clinical Summary of Care: 03/09/2015 Form Type Recipient Paper Patient LP Electronic Signature(s) Signed: 03/09/2015 3:30:54 PM By: Ruthine Dose Entered By: Ruthine Dose on 03/09/2015 15:30:54 [name] ([id number]) -------------------------------------------------------------------------------- Lower Extremity Assessment Details Patient Name: [name], [name]. Date of Service: 03/09/2015 2:30 PM Medical Record Number: [id number] Patient Account Number: [id number] Date of Birth/Sex: 04/02/1946 (69 y.o. Female) Treating RN: [name] Primary Care Physician: [name] Other  Clinician: Referring Physician: [name] Treating Physician/Extender: BURNS, Charlean Sanfilippo in Treatment: 0 Edema Assessment Assessed: [Left: Yes] [Right: Yes] Edema: [Left: Yes] [Right: Yes] Calf Left: Right: Point of Measurement: 28 cm From Medial Instep 40.5 cm 39.5 cm Ankle Left: Right: Point of Measurement: 10 cm From Medial Instep 25.7 cm 25.6 cm Vascular Assessment Claudication: Claudication Assessment [Left:None] [Right:None] Pulses: Posterior Tibial Palpable: [Left:Yes] [Right:Yes] Dorsalis Pedis Palpable: [Left:Yes] [Right:Yes] Extremity colors, hair growth, and conditions: Extremity Color: [Left:Normal] Hair Growth on Extremity: [Left:No] [Right:No] Temperature of Extremity: [Left:Warm] [Right:Warm] Capillary Refill: [Left:< 3 seconds] [Right:< 3 seconds] Dependent Rubor: [Left:No] [Right:No] Blanched when Elevated: [Left:No] [Right:No] Blood Pressure: Brachial: [Left:124] [Right:124] Dorsalis Pedis: 160 [Left:Dorsalis Pedis: 160] Ankle: Posterior Tibial: 158 [Left:Posterior Tibial: 150 1.29] [Right:1.29] Toe Nail Assessment Left: Right: Thick: Yes Yes Discolored: Yes Yes [name], [name]. ([id number]) Deformed: No No Improper Length and Hygiene: No No Electronic Signature(s) Signed: 03/09/2015 4:07:52 PM By: [name] RN Entered By: [name] on 03/09/2015 14:42:58 [name], [name] ([id number]) -------------------------------------------------------------------------------- Multi Wound Chart Details Patient Name: [name], [name]. Date of Service: 03/09/2015 2:30 PM Medical Record Number: [id number] Patient Account Number: [id number] Date of Birth/Sex: 04/18/1946 (70 y.o. Female) Treating RN: [name] Primary Care Physician: [name] Other Clinician: Referring Physician: [name] Treating Physician/Extender: BURNS, Charlean Sanfilippo in Treatment: 0 Vital Signs Height(in): 63 Pulse(bpm): 67 Weight(lbs): 214.7 Blood  Pressure 158/89 (mmHg): Body Mass Index(BMI): 38 Temperature(F): 98.6 Respiratory Rate 16 (breaths/min): Photos: [1:No Photos] [N/A:N/A] Wound Location: [1:Left Lower Leg - Lateral] [N/A:N/A] Wounding Event: [1:Gradually Appeared] [N/A:N/A] Primary Etiology: [1:Venous Leg Ulcer] [N/A:N/A] Comorbid History: [1:Cataracts, Asthma, Sleep Apnea, Deep Vein Thrombosis, Hypertension, Peripheral Venous Disease, Osteoarthritis, Received Chemotherapy, Received Radiation] [N/A:N/A] Date Acquired: [1:11/05/2014] [N/A:N/A] Weeks of Treatment: [1:0] [N/A:N/A] Wound Status: [1:Open] [N/A:N/A] Measurements L x W x D 0.7x0.6x0.1 [N/A:N/A] (cm) Area (cm) : [1:0.33] [N/A:N/A] Volume (cm) : [1:0.033] [N/A:N/A] % Reduction in Area: [1:0.00%] [N/A:N/A] % Reduction in Volume: 0.00% [N/A:N/A] Classification: [1:Full Thickness Without Exposed Support Structures] [N/A:N/A] Exudate Amount: [1:Small] [N/A:N/A] Exudate Type: [1:Serous] [N/A:N/A] Exudate Color: [1:amber] [N/A:N/A] Wound Margin: [1:Indistinct, nonvisible] [N/A:N/A] Granulation Amount: [1:Small (1-33%)] [N/A:N/A] Granulation Quality: [1:Red] [N/A:N/A] Necrotic Amount: Medium (34-66%) N/A N/A Exposed Structures: Fascia: No N/A N/A Fat: No Tendon: No Muscle: No Joint: No Bone: No Limited to Skin  Breakdown Epithelialization: Small (1-33%) N/A N/A Periwound Skin Texture: Edema: Yes N/A N/A Excoriation: No Induration: No Callus: No Crepitus: No Fluctuance: No Friable: No Rash: No Scarring: No Periwound Skin Moist: Yes N/A N/A Moisture: Maceration: No Dry/Scaly: No Periwound Skin Color: Erythema: Yes N/A N/A Atrophie Blanche: No Cyanosis: No Ecchymosis: No Hemosiderin Staining: No Mottled: No Pallor: No Rubor: No Temperature: No Abnormality N/A N/A Tenderness on Yes N/A N/A Palpation: Wound Preparation: Ulcer Cleansing: N/A N/A Rinsed/Irrigated with Saline Topical Anesthetic Applied: Other: Lidocaine 4%  Ointment Treatment Notes Electronic Signature(s) Signed: 03/09/2015 4:07:52 PM By: [name] RN Entered By: [name] on 03/09/2015 14:59:35 Myron, Tenna [name] ([id number]) -------------------------------------------------------------------------------- Multi-Disciplinary Care Plan Details Patient Name: [name], [name]. Date of Service: 03/09/2015 2:30 PM Medical Record Number: [id number] Patient Account Number: [id number] Date of Birth/Sex: [birth year] (69 y.o. Female) Treating RN: [name] Primary Care Physician: [name] Other Clinician: Referring Physician: [name] Treating Physician/Extender: BURNS, Charlean Sanfilippo in Treatment: 0 Active Inactive Abuse / Safety / Falls / Self Care Management Nursing Diagnoses: Potential for falls Goals: Patient will remain injury free Date Initiated: 03/09/2015 Goal Status: Active Patient/caregiver will verbalize understanding of skin care regimen Date Initiated: 03/09/2015 Goal Status: Active Patient/caregiver will verbalize/demonstrate measures taken to prevent injury and/or falls Date Initiated: 03/09/2015 Goal Status: Active Patient/caregiver will verbalize/demonstrate understanding of what to do in case of emergency Date Initiated: 03/09/2015 Goal Status: Active Interventions: Assess fall risk on admission and as needed Assess: immobility, friction, shearing, incontinence upon admission and as needed Assess impairment of mobility on admission and as needed per policy Provide education on fall prevention Provide education on safe transfers Notes: Nutrition Nursing Diagnoses: Potential for alteratiion in Nutrition/Potential for imbalanced nutrition Goals: Patient/caregiver agrees to and verbalizes understanding of need to use nutritional supplements and/or vitamins as prescribed MAKALEY, STORTS ([id number]) Date Initiated: 03/09/2015 Goal Status: Active Interventions: Assess patient nutrition upon admission and as  needed per policy Provide education on nutrition Notes: Orientation to the Wound Care Program Nursing Diagnoses: Knowledge deficit related to the wound healing center program Goals: Patient/caregiver will verbalize understanding of the [place] Program Date Initiated: 03/09/2015 Goal Status: Active Interventions: Provide education on orientation to the wound center Notes: Venous Leg Ulcer Nursing Diagnoses: Actual venous Insuffiency (use after diagnosis is confirmed) Knowledge deficit related to disease process and management Goals: Non-invasive venous studies are completed as ordered Date Initiated: 03/09/2015 Goal Status: Active Patient will maintain optimal edema control Date Initiated: 03/09/2015 Goal Status: Active Patient/caregiver will verbalize understanding of disease process and disease management Date Initiated: 03/09/2015 Goal Status: Active Verify adequate tissue perfusion prior to therapeutic compression application Date Initiated: 03/09/2015 Goal Status: Active Interventions: Assess peripheral edema status every visit. Compression as ordered [name], [name] ([id number]) Provide education on venous insufficiency Treatment Activities: Non-invasive vascular studies : 03/09/2015 Therapeutic compression applied : 03/09/2015 Notes: Wound/Skin Impairment Nursing Diagnoses: Impaired tissue integrity Knowledge deficit related to ulceration/compromised skin integrity Goals: Patient/caregiver will verbalize understanding of skin care regimen Date Initiated: 03/09/2015 Goal Status: Active Ulcer/skin breakdown will heal within 14 weeks Date Initiated: 03/09/2015 Goal Status: Active Interventions: Assess patient/caregiver ability to obtain necessary supplies Assess patient/caregiver ability to perform ulcer/skin care regimen upon admission and as needed Assess ulceration(s) every visit Provide education on ulcer and skin care Treatment Activities: Skin care regimen  initiated : 03/09/2015 Topical wound management initiated : 03/09/2015 Notes: Electronic Signature(s) Signed: 03/09/2015 4:07:52 PM By: [name] RN Entered By: [name] on 03/09/2015  14:59:19 [name], [name] ([id number]) -------------------------------------------------------------------------------- Pain Assessment Details Patient Name: [name], [name]. Date of Service: 03/09/2015 2:30 PM Medical Record Number: [id number] Patient Account Number: [id number] Date of Birth/Sex: [birth year] (69 y.o. Female) Treating RN: [name] Primary Care Physician: [name] Other Clinician: Referring Physician: [name] Treating Physician/Extender: BURNS, Charlean Sanfilippo in Treatment: 0 Active Problems Location of Pain Severity and Description of Pain Patient Has Paino Yes Site Locations Pain Location: Pain in Ulcers With Dressing Change: Yes Duration of the Pain. Constant / Intermittento Intermittent Rate the pain. Current Pain Level: 0 Worst Pain Level: 10 Least Pain Level: 0 Character of Pain Describe the Pain: Burning Pain Management and Medication Current Pain Management: Electronic Signature(s) Signed: 03/09/2015 4:07:52 PM By: [name] RN Entered By: [name] on 03/09/2015 14:16:01 Colunga, Tenna [name] ([id number]) -------------------------------------------------------------------------------- Patient/Caregiver Education Details Patient Name: [name], [name]. Date of Service: 03/09/2015 2:30 PM Medical Record Number: [id number] Patient Account Number: [id number] Date of Birth/Gender: 05/02/1946 (69 y.o. Female) Treating RN: [name] Primary Care Physician: [name] Other Clinician: Referring Physician: [name] Treating Physician/Extender: BURNS, Charlean Sanfilippo in Treatment: 0 Education Assessment Education Provided To: Patient Education Topics Provided Nutrition: Methods: Explain/Verbal Responses: State content  correctly Safety: Methods: Explain/Verbal Responses: State content correctly Venous: Handouts: Controlling Swelling with Multilayered Compression Wraps Methods: Explain/Verbal Responses: State content correctly Welcome To [facility] [place]: Methods: Explain/Verbal Responses: State content correctly Wound/Skin Impairment: Methods: Explain/Verbal Responses: State content correctly Electronic Signature(s) Signed: 03/09/2015 4:07:52 PM By: [name] RN Entered By: [name] on 03/09/2015 15:15:30 Sledge, Tenna [name] ([id number]) -------------------------------------------------------------------------------- Wound Assessment Details Patient Name: ALISEN, MARSIGLIA. Date of Service: 03/09/2015 2:30 PM Medical Record Number: [id number] Patient Account Number: [id number] Date of Birth/Sex: [birth year] (69 y.o. Female) Treating RN: [name] Primary Care Physician: [name] Other Clinician: Referring Physician: [name] Treating Physician/Extender: BURNS, Charlean Sanfilippo in Treatment: 0 Wound Status Wound Number: 1 Primary Venous Leg Ulcer Etiology: Wound Location: Left Lower Leg - Lateral Wound Open Wounding Event: Gradually Appeared Status: Date Acquired: 11/05/2014 Comorbid Cataracts, Asthma, Sleep Apnea, Deep Weeks Of Treatment: 0 History: Vein Thrombosis, Hypertension, Clustered Wound: No Peripheral Venous Disease, Osteoarthritis, Received Chemotherapy, Received Radiation Photos Photo Uploaded By: [name] on 03/09/2015 16:00:33 Wound Measurements Length: (cm) 0.7 Width: (cm) 0.6 Depth: (cm) 0.1 Area: (cm) 0.33 Volume: (cm) 0.033 % Reduction in Area: 0% % Reduction in Volume: 0% Epithelialization: Small (1-33%) Tunneling: No Undermining: No Wound Description Full Thickness Without Exposed Foul Odor Aft Classification: Support Structures Wound Margin: Indistinct, nonvisible Exudate Small Amount: Exudate Type: Serous Exudate Color:  amber er Cleansing: No Wound Bed MARGERITE, IMPASTATO. ([id number]) Granulation Amount: Small (1-33%) Exposed Structure Granulation Quality: Red Fascia Exposed: No Necrotic Amount: Medium (34-66%) Fat Layer Exposed: No Necrotic Quality: Adherent Slough Tendon Exposed: No Muscle Exposed: No Joint Exposed: No Bone Exposed: No Limited to Skin Breakdown Periwound Skin Texture Texture Color No Abnormalities Noted: No No Abnormalities Noted: No Callus: No Atrophie Blanche: No Crepitus: No Cyanosis: No Excoriation: No Ecchymosis: No Fluctuance: No Erythema: Yes Friable: No Hemosiderin Staining: No Induration: No Mottled: No Localized Edema: Yes Pallor: No Rash: No Rubor: No Scarring: No Temperature / Pain Moisture Temperature: No Abnormality No Abnormalities Noted: No Tenderness on Palpation: Yes Dry / Scaly: No Maceration: No Moist: Yes Wound Preparation Ulcer Cleansing: Rinsed/Irrigated with Saline Topical Anesthetic Applied: Other: Lidocaine 4% Ointment , Electronic Signature(s) Signed: 03/09/2015 4:07:52 PM By: [name] RN Entered By: [name] on 03/09/2015  14:48:17 KANNA, DAFOE ([id number]) -------------------------------------------------------------------------------- Vitals Details Patient Name: VERLEY, PARISEAU. Date of Service: 03/09/2015 2:30 PM Medical Record Number: [id number] Patient Account Number: [id number] Date of Birth/Sex: Dec 13, 1945 (69 y.o. Female) Treating RN: [name] Primary Care Physician: [name] Other Clinician: Referring Physician: [name] Treating Physician/Extender: BURNS, Charlean Sanfilippo in Treatment: 0 Vital Signs Time Taken: 14:15 Temperature (F): 98.6 Height (in): 63 Pulse (bpm): 67 Source: Stated Respiratory Rate (breaths/min): 16 Weight (lbs): 214.7 Blood Pressure (mmHg): 158/89 Source: Measured Reference Range: 80 - 120 mg / dl Body Mass Index (BMI): 38 Electronic Signature(s) Signed:  03/09/2015 4:07:52 PM By: [name] RN Entered By: [name] on 03/09/2015 14:17:04

## 2015-03-09 NOTE — Progress Notes (Signed)
[name], [name] ([id number]) Visit Report for 03/09/2015 Abuse/Suicide Risk Screen Details Patient Name: [name], [name]. Date of Service: 03/09/2015 2:30 PM Medical Record Number: [id number] Patient Account Number: [id number] Date of Birth/Sex: 1946/06/27 (69 y.o. Female) Treating RN: [name] Primary Care Physician: [name] Other Clinician: Referring Physician: [name] Treating Physician/Extender: BURNS, Charlean Sanfilippo in Treatment: 0 Abuse/Suicide Risk Screen Items Answer ABUSE/SUICIDE RISK SCREEN: Has anyone close to you tried to hurt or harm you recentlyo No Do you feel uncomfortable with anyone in your familyo No Has anyone forced you do things that you didnot want to doo No Do you have any thoughts of harming yourselfo No Patient displays signs or symptoms of abuse and/or neglect. No Electronic Signature(s) Signed: 03/09/2015 4:07:52 PM By: [name] RN Entered By: [name] on 03/09/2015 14:31:03 [name], [name] ([id number]) -------------------------------------------------------------------------------- Activities of Daily Living Details Patient Name: [name]. Date of Service: 03/09/2015 2:30 PM Medical Record Number: [id number] Patient Account Number: [id number] Date of Birth/Sex: October 18, 1946 (69 y.o. Female) Treating RN: [name] Primary Care Physician: [name] Other Clinician: Referring Physician: [name] Treating Physician/Extender: BURNS, Charlean Sanfilippo in Treatment: 0 Activities of Daily Living Items Answer Activities of Daily Living (Please select one for each item) Drive Automobile Completely Able Take Medications Completely Able Use Telephone Completely Able Care for Appearance Completely Able Use Toilet Completely Able Bath / Shower Completely Able Dress Self Completely Able Feed Self Completely Able Walk Completely Able Get In / Out Bed Completely Able Housework Completely Able Prepare Meals  Completely Able Handle Money Completely Able Shop for Self Completely Able Electronic Signature(s) Signed: 03/09/2015 4:07:52 PM By: [name] RN Entered By: [name] on 03/09/2015 14:31:15 [name], [name] ([id number]) -------------------------------------------------------------------------------- Education Assessment Details Patient Name: [name], [name]. Date of Service: 03/09/2015 2:30 PM Medical Record Number: [id number] Patient Account Number: [id number] Date of Birth/Sex: 1946/10/16 (69 y.o. Female) Treating RN: [name] Primary Care Physician: [name] Other Clinician: Referring Physician: [name] Treating Physician/Extender: BURNS, Charlean Sanfilippo in Treatment: 0 Primary Learner Assessed: Patient Learning Preferences/Education Level/Primary Language Learning Preference: Demonstration, Printed Material Highest Education Level: College or Above Preferred Language: English Cognitive Barrier Assessment/Beliefs Language Barrier: No Translator Needed: No Memory Deficit: No Emotional Barrier: No Cultural/Religious Beliefs Affecting Medical No Care: Physical Barrier Assessment Impaired Vision: Yes Glasses Impaired Hearing: No Decreased Hand dexterity: No Knowledge/Comprehension Assessment Knowledge Level: Medium Comprehension Level: Medium Ability to understand written Medium instructions: Ability to understand verbal Medium instructions: Motivation Assessment Anxiety Level: Calm Cooperation: Cooperative Education Importance: Acknowledges Need Interest in Health Problems: Asks Questions Perception: Coherent Willingness to Engage in Self- Medium Management Activities: Readiness to Engage in Self- Medium Management Activities: Electronic Signature(s) [name], [name] ([id number]) Signed: 03/09/2015 4:07:52 PM By: [name] RN Entered By: [name] on 03/09/2015 14:31:34 [name], [name]  ([id number]) -------------------------------------------------------------------------------- Fall Risk Assessment Details Patient Name: [name]. Date of Service: 03/09/2015 2:30 PM Medical Record Number: [id number] Patient Account Number: [id number] Date of Birth/Sex: 06/14/1946 (69 y.o. Female) Treating RN: [name] Primary Care Physician: [name] Other Clinician: Referring Physician: [name] Treating Physician/Extender: BURNS, Charlean Sanfilippo in Treatment: 0 Fall Risk Assessment Items FALL RISK ASSESSMENT: History of falling - immediate or within 3 months 25 Yes Secondary diagnosis 0 No Ambulatory aid None/bed rest/wheelchair/nurse 0 Yes Crutches/cane/walker 0 No Furniture 0 No IV Access/Saline Lock 0 No Gait/Training Normal/bed rest/immobile 0 Yes Weak 0 No Impaired 0 No Mental Status Oriented to own ability 0  Yes Electronic Signature(s) Signed: 03/09/2015 4:07:52 PM By: [name] RN Entered By: [name] on 03/09/2015 14:32:33 [name], [name] ([id number]) -------------------------------------------------------------------------------- Foot Assessment Details Patient Name: [name], [name]. Date of Service: 03/09/2015 2:30 PM Medical Record Number: [id number] Patient Account Number: [id number] Date of Birth/Sex: [birth year] (69 y.o. Female) Treating RN: [name] Primary Care Physician: [name] Other Clinician: Referring Physician: [name] Treating Physician/Extender: BURNS, Charlean Sanfilippo in Treatment: 0 Foot Assessment Items Site Locations + = Sensation present, - = Sensation absent, C = Callus, U = Ulcer R = Redness, W = Warmth, M = Maceration, PU = Pre-ulcerative lesion F = Fissure, S = Swelling, D = Dryness Assessment Right: Left: Other Deformity: No No Prior Foot Ulcer: No No Prior Amputation: No No Charcot Joint: No No Ambulatory Status: Ambulatory Without Help Gait: Steady Electronic  Signature(s) Signed: 03/09/2015 4:07:52 PM By: [name] RN Entered By: [name] on 03/09/2015 14:51:20 [name], [name] ([id number]) -------------------------------------------------------------------------------- Nutrition Risk Assessment Details Patient Name: [name], [name]. Date of Service: 03/09/2015 2:30 PM Medical Record Number: [id number] Patient Account Number: [id number] Date of Birth/Sex: 1946/08/29 (69 y.o. Female) Treating RN: [name] Primary Care Physician: [name] Other Clinician: Referring Physician: [name] Treating Physician/Extender: BURNS, Charlean Sanfilippo in Treatment: 0 Height (in): 63 Weight (lbs): 214.7 Body Mass Index (BMI): 38 Nutrition Risk Assessment Items NUTRITION RISK SCREEN: I have an illness or condition that made me change the kind and/or 0 No amount of food I eat I eat fewer than two meals per day 0 No I eat few fruits and vegetables, or milk products 2 Yes I have three or more drinks of beer, liquor or wine almost every day 0 No I have tooth or mouth problems that make it hard for me to eat 0 No I don't always have enough money to buy the food I need 0 No I eat alone most of the time 1 Yes I take three or more different prescribed or over-the-counter drugs a 1 Yes day Without wanting to, I have lost or gained 10 pounds in the last six 0 No months I am not always physically able to shop, cook and/or feed myself 0 No Nutrition Protocols Good Risk Protocol Provide education on Moderate Risk Protocol 0 nutrition Electronic Signature(s) Signed: 03/09/2015 4:07:52 PM By: [name] RN Entered By: [name] on 03/09/2015 14:33:32

## 2015-03-11 NOTE — Progress Notes (Addendum)
Amber, [name] ([id number]) Visit Report for 03/09/2015 Chief Complaint Document Details Patient Name: [name], [name]. Date of Service: 03/09/2015 2:30 PM Medical Record Number: [id number] Patient Account Number: [id number] Date of Birth/Sex: 08/26/1946 (69 y.o. Female) Treating RN: [name], RN, [name], Velva Harman Primary Care Physician: [name] Other Clinician: Referring Physician: [name] Treating Physician/Extender: BURNS, Charlean Sanfilippo in Treatment: 0 Information Obtained from: Patient Chief Complaint Left calf venous stasis ulceration. Electronic Signature(s) Signed: 03/09/2015 4:29:30 PM By: [name] MD Entered By: [name] on 03/09/2015 15:47:38 Berkowitz, [name] ([id number]) -------------------------------------------------------------------------------- Debridement Details Patient Name: [name], [name]. Date of Service: 03/09/2015 2:30 PM Medical Record Number: [id number] Patient Account Number: [id number] Date of Birth/Sex: August 25, 1946 (69 y.o. Female) Treating RN: Afful, RN, [name], Velva Harman Primary Care Physician: [name] Other Clinician: Referring Physician: [name] Treating Physician/Extender: BURNS, Charlean Sanfilippo in Treatment: 0 Debridement Performed for Wound #1 Left,Lateral Lower Leg Assessment: Performed By: Physician BURNS, Kenza Munar, Debridement: Open Wound/Selective Debridement Selective Description: Pre-procedure Yes Verification/Time Out Taken: Start Time: 15:10 Pain Control: Lidocaine 4% Topical Solution Level: Non-Viable Tissue Total Area Debrided (L x 0.7 (cm) x 0.6 (cm) = 0.42 (cm) W): Tissue and other Non-Viable, Fibrin/Slough material debrided: Instrument: Curette Bleeding: Minimum Hemostasis Achieved: Pressure End Time: 15:11 Procedural Pain: 0 Post Procedural Pain: 0 Response to Treatment: Procedure was tolerated well Post Debridement Measurements of Total Wound Length: (cm) 0.7 Width: (cm) 0.6 Depth:  (cm) 0.2 Volume: (cm) 0.066 Electronic Signature(s) Signed: 03/09/2015 4:29:30 PM By: [name] MD Signed: 03/10/2015 5:21:09 PM By: Regan Lemming [name], RN Entered By: [name] on 03/09/2015 15:47:19 Shamoon, [name] ([id number]) -------------------------------------------------------------------------------- HPI Details Patient Name: [name], [name]. Date of Service: 03/09/2015 2:30 PM Medical Record Number: [id number] Patient Account Number: [id number] Date of Birth/Sex: 1946-02-08 (69 y.o. Female) Treating RN: [name], RN, [name], Velva Harman Primary Care Physician: [name] Other Clinician: Referring Physician: [name] Treating Physician/Extender: BURNS, Charlean Sanfilippo in Treatment: 0 History of Present Illness HPI Description: Pleasant 69 year old with history of chronic venous insufficiency. No diabetes or peripheral vascular disease. Left ABI 1.29. Questionable history of left lower extremity DVT. She developed a recurrent ulceration on her left lateral calf in December 2015, which she attributes to poor diet and subsequent lower extremity edema. She underwent endovenous laser ablation of her left greater saphenous vein in 2010. She underwent additional venous procedure in April 2016 by Dr. Kellie Simmering at [facility]. Records requested. She was previously wearing Unna boots, which she tolerated well. She was in thigh-high compression stockings after her most recent procedure but has not been wearing any compression for the cast couple of weeks. She denies any significant pain at this time. She reports severe pain with pressure. No claudication or ischemic rest pain. No fever or chills. No significant drainage. Electronic Signature(s) Signed: 03/09/2015 4:29:30 PM By: [name] MD Entered By: [name] on 03/09/2015 15:50:50 DORIANA, MAZURKIEWICZ ([id number]) -------------------------------------------------------------------------------- Physical Exam  Details Patient Name: [name], [name]. Date of Service: 03/09/2015 2:30 PM Medical Record Number: [id number] Patient Account Number: [id number] Date of Birth/Sex: 01/11/1946 (69 y.o. Female) Treating RN: [name], RN, [name], Velva Harman Primary Care Physician: [name] Other Clinician: Referring Physician: [name] Treating Physician/Extender: BURNS, Charlean Sanfilippo in Treatment: 0 Constitutional . Pulse regular. Respirations normal and unlabored. Afebrile. Marland Kitchen Respiratory WNL. No retractions.. Cardiovascular Femoral Pulses WNL, no bruits. . Integumentary (Hair, Skin) .Marland Kitchen Neurological Sensation normal to touch, pin,and vibration. Psychiatric Judgement and insight Intact.. Oriented times  3.. No evidence of depression, anxiety, or agitation.. Notes Left lateral calf ulceration. Full-thickness. No exposed deep structures. No evidence for infection. No features to suggest malignancy/melanoma. 1+ pitting edema. No palpable pedal pulses. Dopplerable DP and PT, both monophasic. ABIs abnormally elevated at 1.29. Electronic Signature(s) Signed: 03/09/2015 4:29:30 PM By: [name] MD Entered By: [name] on 03/09/2015 15:52:01 Baris, [name] ([id number]) -------------------------------------------------------------------------------- Physician Orders Details Patient Name: TAKIYA, [name]. Date of Service: 03/09/2015 2:30 PM Medical Record Number: [id number] Patient Account Number: [id number] Date of Birth/Sex: 04-Mar-1946 (69 y.o. Female) Treating RN: [name] Primary Care Physician: [name] Other Clinician: Referring Physician: [name] Treating Physician/Extender: BURNS, Charlean Sanfilippo in Treatment: 0 Verbal / Phone Orders: Yes Clinician: [name] Read Back and Verified: Yes Diagnosis Coding Wound Cleansing Wound #1 Left,Lateral Lower Leg o Clean wound with Normal Saline. Anesthetic Wound #1 Left,Lateral Lower Leg o Topical Lidocaine  4% cream applied to wound bed prior to debridement Primary Wound Dressing Wound #1 Left,Lateral Lower Leg o Iodoflex Secondary Dressing Wound #1 Left,Lateral Lower Leg o Dry Gauze Dressing Change Frequency Wound #1 Left,Lateral Lower Leg o Change dressing every week Follow-up Appointments Wound #1 Left,Lateral Lower Leg o Return Appointment in 1 week. Edema Control o 2 Layer Lite Compression System - Left Lower Extremity Electronic Signature(s) Signed: 03/09/2015 4:07:52 PM By: [name] RN Signed: 03/09/2015 4:29:30 PM By: [name] MD Entered By: [name] on 03/09/2015 15:13:45 Alejos, [name] ([id number]) -------------------------------------------------------------------------------- Problem List Details Patient Name: [name], [name]. Date of Service: 03/09/2015 2:30 PM Medical Record Number: [id number] Patient Account Number: [id number] Date of Birth/Sex: 05-17-1946 (69 y.o. 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 Date of Service: 03/09/2015 2:30 PM Medical Record Number: [id number] Patient Account Number: [id number] Date of Birth/Sex:  01-22-1946 (69 y.o. Female) Treating RN: [name], RN, [name], Velva Harman Primary Care Physician: [name] Other Clinician: Referring Physician: [name] Treating Physician/Extender: [name] in Treatment: 0 Subjective Chief Complaint Information obtained from Patient Left calf venous stasis ulceration. History of Present Illness (HPI) Pleasant 69 year old with history of chronic venous insufficiency. No diabetes or peripheral vascular disease. Left ABI 1.29. Questionable history of left lower extremity DVT. She developed a recurrent ulceration on her left lateral calf in December 2015, which she attributes to poor diet and subsequent lower extremity edema. She underwent endovenous laser ablation of her left greater saphenous vein in 2010. She underwent additional venous procedure in April 2016 by Dr. Kellie Simmering at [facility]. Records requested. She was previously wearing Unna boots, which she tolerated well. She was in thigh-high compression stockings after her most recent procedure but has not been wearing any compression for the cast couple of weeks. She denies any significant pain at this time. She reports severe pain with pressure. No claudication or ischemic rest pain. No fever or chills. No significant drainage. Wound History Patient presents with 1 open wound that  has been present for approximately 4 months. Patient has been treating wound in the following manner: douderm; vaseline previously. The wound has been healed in the past but has re-opened. Laboratory tests have not been performed in the last month. Patient reportedly has not tested positive for an antibiotic resistant organism. Patient reportedly has not tested positive for osteomyelitis. Patient reportedly has had testing performed to evaluate circulation in the legs. Patient experiences the following problems associated with their wounds: infection, swelling. Patient History Information obtained from  Patient. Allergies Sulfa drugs (Reaction: facial swelling) Family History Cancer - Mother, Paternal Grandparents, Hypertension - Mother, Father, Kidney Disease - Paternal Grandparents, Lung Disease - Paternal Grandparents, No family history of Diabetes, Heart Disease, Hereditary Spherocytosis, Seizures, Stroke, Thyroid Problems, Tuberculosis. LALITA, EBEL ([id number]) Social History Former smoker - quit at age 53, Marital Status - Divorced, Alcohol Use - Never, Drug Use - No History, Caffeine Use - Daily. Medical History Eyes Patient has history of Cataracts Denies history of Glaucoma, Optic Neuritis Ear/Nose/Mouth/Throat Denies history of Chronic sinus problems/congestion, Middle ear problems Hematologic/Lymphatic Denies history of Anemia, Hemophilia, Human Immunodeficiency Virus, Lymphedema, Sickle Cell Disease Respiratory Patient has history of Asthma, Sleep Apnea Denies history of Aspiration, Chronic Obstructive Pulmonary Disease (COPD), Pneumothorax, Tuberculosis Cardiovascular Patient has history of Deep Vein Thrombosis - LLE 2010, Hypertension, Peripheral Venous Disease Denies history of Angina, Arrhythmia, Congestive Heart Failure, Coronary Artery Disease, Hypotension, Myocardial Infarction, Peripheral Arterial Disease, Phlebitis, Vasculitis Gastrointestinal Denies history of Cirrhosis , Colitis, Crohn s, Hepatitis A, Hepatitis B, Hepatitis C Endocrine Denies history of Type I Diabetes, Type II Diabetes Genitourinary Denies history of End Stage Renal Disease Immunological Denies history of Lupus Erythematosus, Raynaud s, Scleroderma Integumentary (Skin) Denies history of History of Burn, History of pressure wounds Musculoskeletal Patient has history of Osteoarthritis - neck Denies history of Gout, Rheumatoid Arthritis, Osteomyelitis Neurologic Denies history of Dementia, Neuropathy, Quadriplegia, Paraplegia, Seizure Disorder Oncologic Patient has history of  Received Chemotherapy - interfeon immunotherapy, Received Radiation Psychiatric Denies history of Anorexia/bulimia, Confinement Anxiety Hospitalization/Surgery History - 08/05/2010, [facility], Melanoma sx . Medical And Surgical History Notes Constitutional Symptoms (General Health) Vein ablation LLE 2010 Vascular Sx ( vein ablationo) LLE 02/2015 Genitourinary Kidney stones Oncologic Melanoma (R) shoulder 07/2010; (R) axillary lymphnode removal Review of Systems (ROS) Constitutional Symptoms (General Health) CHAKITA, MCGRAW ([id number]) The patient has no complaints or symptoms. Eyes Complains or has symptoms of Glasses / Contacts. Ear/Nose/Mouth/Throat The patient has no complaints or symptoms. Hematologic/Lymphatic The patient has no complaints or symptoms. Respiratory The patient has no complaints or symptoms. Cardiovascular The patient has no complaints or symptoms. Gastrointestinal The patient has no complaints or symptoms. Endocrine The patient has no complaints or symptoms. Genitourinary The patient has no complaints or symptoms. Immunological The patient has no complaints or symptoms. Integumentary (Skin) Complains or has symptoms of Wounds, Swelling. Denies complaints or symptoms of Bleeding or bruising tendency, Breakdown. Musculoskeletal The patient has no complaints or symptoms. Neurologic Complains or has symptoms of Numbness/parasthesias - arthritis is neck and CA tx caused numbness is finger tips. Psychiatric The patient has no complaints or symptoms. Objective Constitutional Pulse regular. Respirations normal and unlabored. Afebrile. Vitals Time Taken: 2:15 PM, Height: 63 in, Source: Stated, Weight: 214.7 lbs, Source: Measured, BMI: 38, Temperature: 98.6 F, Pulse: 67 bpm, Respiratory Rate: 16 breaths/min, Blood Pressure: 158/89 mmHg. Respiratory WNL. No retractions.. Cardiovascular Femoral Pulses WNL, no bruits. MCKENZEE, BEEM  ([id number]) Neurological Sensation normal to touch, pin,and vibration. Psychiatric Judgement and  insight Intact.. Oriented times 3.. No evidence of depression, anxiety, or agitation.. General Notes: Left lateral calf ulceration. Full-thickness. No exposed deep structures. No evidence for infection. No features to suggest malignancy/melanoma. 1+ pitting edema. No palpable pedal pulses. Dopplerable DP and PT, both monophasic. ABIs abnormally elevated at 1.29. Integumentary (Hair, Skin) Wound #1 status is Open. Original cause of wound was Gradually Appeared. The wound is located on the Left,Lateral Lower Leg. The wound measures 0.7cm length x 0.6cm width x 0.1cm depth; 0.33cm^2 area and 0.033cm^3 volume. The wound is limited to skin breakdown. There is no tunneling or undermining noted. There is a small amount of serous drainage noted. The wound margin is indistinct and nonvisible. There is small (1-33%) red granulation within the wound bed. There is a medium (34-66%) amount of necrotic tissue within the wound bed including Adherent Slough. The periwound skin appearance exhibited: Localized Edema, Moist, Erythema. The periwound skin appearance did not exhibit: Callus, Crepitus, Excoriation, Fluctuance, Friable, Induration, Rash, Scarring, Dry/Scaly, Maceration, Atrophie Blanche, Cyanosis, Ecchymosis, Hemosiderin Staining, Mottled, Pallor, Rubor. The surrounding wound skin color is noted with erythema. Periwound temperature was noted as No Abnormality. The periwound has tenderness on palpation. 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 RAELENE, TREW ([id number]) Procedures Wound #1 Wound #1 is a Venous Leg Ulcer located on the Left,Lateral Lower Leg . There was a Non-Viable Tissue Open Wound/Selective [phone]) debridement with total area of 0.42 sq cm performed by BURNS, Floria Brandau. with the following instrument(s): Curette to remove Non-Viable tissue/material including Fibrin/Slough after achieving pain control using Lidocaine 4% Topical Solution. A time out was conducted prior to the start of the procedure. A Minimum amount of bleeding was controlled with Pressure. The procedure was tolerated well with a pain level of 0 throughout and a pain level of 0 following the procedure. Post Debridement Measurements: 0.7cm length x 0.6cm width x 0.2cm depth; 0.066cm^3 volume. Plan Wound Cleansing: Wound #1 Left,Lateral Lower Leg: Clean wound with Normal Saline. Anesthetic: Wound #1 Left,Lateral Lower Leg: Topical Lidocaine 4% cream applied to wound bed prior to debridement Primary Wound Dressing: Wound #1 Left,Lateral Lower Leg: Iodoflex Secondary Dressing: Wound #1 Left,Lateral Lower Leg: Dry Gauze Dressing Change Frequency: Wound #1 Left,Lateral Lower Leg: Change dressing every week Follow-up Appointments: Wound #1 Left,Lateral Lower Leg: Return Appointment in 1 week. Edema Control: 2 Layer Lite Compression System - Left Lower Extremity Cadexomer iodine. 2 layer Coban light elastic compression bandage. Obtain records from [facility] (most recent venous procedure and arterial ultrasound). KYNNEDY, CARRENO ([id number]) Electronic Signature(s) Signed: 06/15/2015 10:14:22 AM By: Lorine Bears RCP, RRT, CHT Previous Signature: 03/09/2015 4:29:30 PM Version By: [name] MD Entered By: Lorine Bears on 06/15/2015 10:14:22 TAMETRIA, AHO  ([id number]) -------------------------------------------------------------------------------- ROS/PFSH Details Patient Name: [name], [name]. Date of Service: 03/09/2015 2:30 PM Medical Record Number: [id number] Patient Account Number: [id number] Date of Birth/Sex: [birth year] (69 y.o. Female) Treating RN: [name] Primary Care Physician: [name] Other Clinician: Referring Physician: [name] Treating Physician/Extender: BURNS, Charlean Sanfilippo in Treatment: 0 Label Progress Note Print Version as History and Physical for this encounter Information Obtained From  Patient Wound History Do you currently have one or more open woundso Yes How many open wounds do you currently haveo 1 Approximately how long have you had your woundso 4 months How have you been treating your wound(s) until nowo douderm; vaseline previously Has your wound(s) ever healed and then re-openedo Yes Have you had any lab work done in the past montho No Have you tested positive for an antibiotic resistant organism (MRSA, No VRE)o Have you tested positive for osteomyelitis (bone infection)o No Have you had any tests for circulation on your legso Yes Where was the test doneo [place] [facility] Have you had other problems associated with your woundso Infection, Swelling Eyes Complaints and Symptoms: Positive for: Glasses / Contacts Medical History: Positive for: Cataracts Negative for: Glaucoma; Optic Neuritis Integumentary (Skin) Complaints and Symptoms: Positive for: Wounds; Swelling Negative for: Bleeding or bruising tendency; Breakdown Medical History: Negative for: History of Burn; History of pressure wounds Neurologic Complaints and Symptoms: Positive for: Numbness/parasthesias - arthritis is neck and CA tx caused numbness is finger tips Medical HistoryDAISHA, [name] ([id number]) Negative for: Dementia; Neuropathy; Quadriplegia; Paraplegia; Seizure Disorder Constitutional Symptoms (General  Health) Complaints and Symptoms: No Complaints or Symptoms Medical History: Past Medical History Notes: Vein ablation LLE 2010 Vascular Sx ( vein ablationo) LLE 02/2015 Ear/Nose/Mouth/Throat Complaints and Symptoms: No Complaints or Symptoms Medical History: Negative for: Chronic sinus problems/congestion; Middle ear problems Hematologic/Lymphatic Complaints and Symptoms: No Complaints or Symptoms Medical History: Negative for: Anemia; Hemophilia; Human Immunodeficiency Virus; Lymphedema; Sickle Cell Disease Respiratory Complaints and Symptoms: No Complaints or Symptoms Medical History: Positive for: Asthma; Sleep Apnea Negative for: Aspiration; Chronic Obstructive Pulmonary Disease (COPD); Pneumothorax; Tuberculosis Cardiovascular Complaints and Symptoms: No Complaints or Symptoms Medical History: Positive for: Deep Vein Thrombosis - LLE 2010; Hypertension; Peripheral Venous Disease Negative for: Angina; Arrhythmia; Congestive Heart Failure; Coronary Artery Disease; Hypotension; Myocardial Infarction; Peripheral Arterial Disease; Phlebitis; Vasculitis Gastrointestinal Complaints and Symptoms: No Complaints or Symptoms [name], [name] ([id number]) Medical History: Negative for: Cirrhosis ; Colitis; Crohnos; Hepatitis A; Hepatitis B; Hepatitis C Endocrine Complaints and Symptoms: No Complaints or Symptoms Medical History: Negative for: Type I Diabetes; Type II Diabetes Genitourinary Complaints and Symptoms: No Complaints or Symptoms Medical History: Negative for: End Stage Renal Disease Past Medical History Notes: Kidney stones Immunological Complaints and Symptoms: No Complaints or Symptoms Medical History: Negative for: Lupus Erythematosus; Raynaudos; Scleroderma Musculoskeletal Complaints and Symptoms: No Complaints or Symptoms Medical History: Positive for: Osteoarthritis - neck Negative for: Gout; Rheumatoid Arthritis; Osteomyelitis Oncologic Medical  History: Positive for: Received Chemotherapy - interfeon immunotherapy; Received Radiation Past Medical History Notes: Melanoma (R) shoulder 07/2010; (R) axillary lymphnode removal Psychiatric Complaints and Symptoms: No Complaints or Symptoms Medical History: [name], [name] ([id number]) Negative for: Anorexia/bulimia; Confinement Anxiety HBO Extended History Items Eyes: Cataracts Immunizations Immunization Notes: tetanus shot w/in the last 5 years per pt Hospitalization / Surgery History Name of Hospital Purpose of Hospitalization/Surgery Date [facility] Melanoma sx 08/05/2010 Family and Social History Cancer: Yes - Mother, Paternal Grandparents; Diabetes: No; Heart Disease: No; Hereditary Spherocytosis: No; Hypertension: Yes - Mother, Father; Kidney Disease: Yes - Paternal Grandparents; Lung Disease: Yes - Paternal Grandparents; Seizures: No; Stroke: No; Thyroid Problems: No; Tuberculosis: No; Former smoker - quit at age 48; Marital Status - Divorced; Alcohol Use: Never; Drug Use: No History; Caffeine Use: Daily; Advanced Directives: No; Patient does not want information on Advanced Directives; Do not resuscitate: No; Living Will: No; Medical Power of Attorney: No Physician Affirmation I have reviewed  and agree with the above information. Electronic Signature(s) Signed: 03/09/2015 4:07:52 PM By: [name] RN Signed: 03/09/2015 4:29:30 PM By: [name] MD Entered By: [name] on 03/09/2015 15:54:38 Williamson, [name] ([id number]) -------------------------------------------------------------------------------- SuperBill Details Patient Name: [name], [name]. Date of Service: 03/09/2015 Medical Record Number: [id number] Patient Account Number: [id number] Date of Birth/Sex: 08/07/1946 (69 y.o. 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 of calf and inflammation I87.2 Venous insufficiency (chronic) (peripheral) Z85.820 Personal history of malignant melanoma of skin Facility Procedures CPT4: Description Modifier Quantity Code [id number] 99213 - WOUND CARE VISIT-LEV 3 EST PT 1 CPT4: [id number] - DEBRIDE WOUND 1ST 20 SQ CM OR < 1 ICD-10 Description Diagnosis I83.222 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 of calf and inflammation Physician Procedures CPT4: Description Modifier Quantity Code [id number] WC PHYS LEVEL 3 o NEW PT 1 ICD-10 Description Diagnosis I83.222 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

## 2015-03-16 ENCOUNTER — Encounter: Payer: Medicare Other | Admitting: Surgery

## 2015-03-16 DIAGNOSIS — L97229 Non-pressure chronic ulcer of left calf with unspecified severity: Secondary | ICD-10-CM | POA: Diagnosis not present

## 2015-03-16 DIAGNOSIS — Z85820 Personal history of malignant melanoma of skin: Secondary | ICD-10-CM | POA: Diagnosis not present

## 2015-03-16 DIAGNOSIS — I83222 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 of calf and inflammation: Secondary | ICD-10-CM | POA: Diagnosis not present

## 2015-03-16 DIAGNOSIS — I872 Venous insufficiency (chronic) (peripheral): Secondary | ICD-10-CM | POA: Diagnosis not present

## 2015-03-16 DIAGNOSIS — Z87891 Personal history of nicotine dependence: Secondary | ICD-10-CM | POA: Diagnosis not present

## 2015-03-16 DIAGNOSIS — R600 Localized edema: Secondary | ICD-10-CM | POA: Diagnosis not present

## 2015-03-16 NOTE — Progress Notes (Signed)
DALENA, PLANTZ ([id number]) Visit Report for 03/16/2015 Chief Complaint Document Details Patient Name: [name], [name]. Date of Service: 03/16/2015 11:30 AM Medical Record Number: [id number] Patient Account Number: [id number] Date of Birth/Sex: 09/11/1946 (69 y.o. Female) Treating RN: [name] Primary Care Physician: [name] Other Clinician: Referring Physician: [name] Treating Physician/Extender: [name] in Treatment: 1 Information Obtained from: Patient Chief Complaint Left calf venous stasis ulceration. Electronic Signature(s) Signed: 03/16/2015 3:52:37 PM By: [name] MD Entered By: [name] on 03/16/2015 13:16:36 Riojas, [name] ([id number]) -------------------------------------------------------------------------------- Debridement Details Patient Name: [name], [name]. Date of Service: 03/16/2015 11:30 AM Medical Record Number: [id number] Patient Account Number: [id number] Date of Birth/Sex: 06/17/1946 (69 y.o. Female) Treating RN: [name] Primary Care Physician: [name] Other Clinician: Referring Physician: [name] Treating Physician/Extender: [name] in Treatment: 1 Debridement Performed for Wound #1 Left,Lateral Lower Leg Assessment: Performed By: Physician , Debridement: Debridement Pre-procedure Yes Verification/Time Out Taken: Start Time: 12:12 Pain Control: Lidocaine 4% Topical Solution Level: Skin/Subcutaneous Tissue Total Area Debrided (L x 0.8 (cm) x 0.6 (cm) = 0.48 (cm) W): Tissue and other Viable, Non-Viable, Exudate, Fat, Fibrin/Slough, Subcutaneous material debrided: Instrument: Curette Bleeding: Minimum Hemostasis Achieved: Pressure End Time: 12:12 Procedural Pain: 0 Post Procedural Pain: 0 Response to Treatment: Procedure was tolerated well Post Debridement Measurements of Total Wound Length: (cm) 0.8 Width: (cm) 0.6 Depth: (cm) 0.2 Volume: (cm) 0.075 Electronic  Signature(s) Signed: 03/16/2015 3:52:37 PM By: [name] MD Signed: 03/16/2015 4:35:17 PM By: [name] RN Entered By: [name] on 03/16/2015 13:16:26 Ledbetter, [name] ([id number]) -------------------------------------------------------------------------------- HPI Details Patient Name: [name], [name]. Date of Service: 03/16/2015 11:30 AM Medical Record Number: [id number] Patient Account Number: [id number] Date of Birth/Sex: 1946/05/27 (69 y.o. Female) Treating RN: [name] Primary Care Physician: [name] Other Clinician: Referring Physician: [name] Treating Physician/Extender: [name] in Treatment: 1 History of Present Illness HPI Description: Pleasant 69 year old with history of chronic venous insufficiency. No diabetes or peripheral vascular disease. Left ABI 1.29. Questionable history of left lower extremity DVT. She developed a recurrent ulceration on her left lateral calf in December 2015, which she attributes to poor diet and subsequent lower extremity edema. She underwent endovenous laser ablation of her left greater saphenous vein in 2010. She underwent laser ablation of accessory branch of left GSV in April 2016 by Dr. Kellie Simmering at [facility]. She was previously wearing Unna boots, which she tolerated well. Tolerating 2 layer compression and cadexomer iodine. She returns to clinic for follow-up and is without new complaints. She denies any significant pain at this time. She reports persistent pain with pressure. No claudication or ischemic rest pain. No fever or chills. No significant drainage. Electronic Signature(s) Signed: 03/16/2015 3:52:37 PM By: [name] MD Entered By: [name] on 03/16/2015 13:19:05 Therrell, [name] ([id number]) -------------------------------------------------------------------------------- Physical Exam Details Patient Name: [name], [name]. Date of Service: 03/16/2015 11:30 AM Medical  Record Number: [id number] Patient Account Number: [id number] Date of Birth/Sex: 19-May-1946 (69 y.o. Female) Treating RN: [name] Primary Care Physician: [name] Other Clinician: Referring Physician: [name] Treating Physician/Extender: [name] in Treatment: 1 Constitutional . Pulse regular. Respirations normal and unlabored. Afebrile. . Notes Left lateral calf ulceration. Full-thickness. No exposed deep structures. No evidence for infection. No features to suggest malignancy/melanoma. 1+ pitting edema. No palpable pedal pulses. Dopplerable DP and PT, both monophasic. ABIs abnormally elevated at 1.29. Electronic Signature(s) Signed: 03/16/2015 3:52:37 PM By: [name] MD Entered By:  [name] on 03/16/2015 13:19:36 INDIGO, BARBIAN ([id number]) -------------------------------------------------------------------------------- Physician Orders Details Patient Name: [name], [name]. Date of Service: 03/16/2015 11:30 AM Medical Record Number: [id number] Patient Account Number: [id number] Date of Birth/Sex: Jun 30, 1946 (69 y.o. Female) Treating RN: [name], RN, [name], Velva Harman Primary Care Physician: [name] Other Clinician: Referring Physician: [name] Treating Physician/Extender: [name] in Treatment: 1 Verbal / Phone Orders: Yes Clinician: Afful, RN, [name], Rita Read Back and Verified: Yes Diagnosis Coding Wound Cleansing Wound #1 Left,Lateral Lower Leg o Clean wound with Normal Saline. Anesthetic Wound #1 Left,Lateral Lower Leg o Topical Lidocaine 4% cream applied to wound bed prior to debridement Primary Wound Dressing Wound #1 Left,Lateral Lower Leg o Iodoflex Secondary Dressing Wound #1 Left,Lateral Lower Leg o Dry Gauze Dressing Change Frequency Wound #1 Left,Lateral Lower Leg o Change dressing every week Follow-up Appointments Wound #1 Left,Lateral Lower Leg o Return Appointment in 1 week. Edema Control o 2  Layer Lite Compression System - Left Lower Extremity Electronic Signature(s) Signed: 03/16/2015 12:13:25 PM By: Regan Lemming [name], RN Entered By: Regan Lemming on 03/16/2015 12:13:25 LEGEND, PECORE ([id number]) -------------------------------------------------------------------------------- Problem List Details Patient Name: [name], [name]. Date of Service: 03/16/2015 11:30 AM Medical Record Number: [id number] Patient Account Number: [id number] Date of Birth/Sex: [name] 14, 1946 (69 y.o. 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 Date of Service: 03/16/2015 11:30 AM Medical Record Number: [id number] Patient Account Number: [id number] Date of Birth/Sex: 08-29-1946 (69 y.o. Female) Treating RN: [name] Primary Care Physician: [name] Other Clinician: Referring Physician: [name] Treating Physician/Extender: [name] in Treatment: 1 Subjective Chief Complaint Information obtained from Patient Left calf venous stasis ulceration. History of Present Illness (HPI) Pleasant 69 year old with history of chronic venous insufficiency. No diabetes or peripheral vascular disease. Left ABI 1.29. Questionable  history of left lower extremity DVT. She developed a recurrent ulceration on her left lateral calf in December 2015, which she attributes to poor diet and subsequent lower extremity edema. She underwent endovenous laser ablation of her left greater saphenous vein in 2010. She underwent laser ablation of accessory branch of left GSV in April 2016 by Dr. Kellie Simmering at [facility]. She was previously wearing Unna boots, which she tolerated well. Tolerating 2 layer compression and cadexomer iodine. She returns to clinic for follow-up and is without new complaints. She denies any significant pain at this time. She reports persistent pain with pressure. No claudication or ischemic rest pain. No fever or chills. No significant drainage. Objective Constitutional Pulse regular. Respirations normal and unlabored. Afebrile. Vitals Time Taken: 11:50 AM, Height: 63 in, Weight: 214.7 lbs, BMI: 38, Temperature: 97.85 F, Pulse: 60 bpm, Respiratory Rate: 16 breaths/min, Blood Pressure: 138/81 mmHg. General Notes: Left lateral calf ulceration. Full-thickness. No exposed deep structures. No evidence for infection. No features to suggest malignancy/melanoma. 1+ pitting edema. No palpable pedal pulses. Dopplerable DP and PT, both monophasic. ABIs abnormally elevated at 1.29. Integumentary (Hair, Skin) Wound #1 status is Open. Original cause  of wound was Gradually Appeared. The wound is located on the Left,Lateral Lower Leg. The wound measures 0.8cm length x 0.6cm width x 0.1cm depth; 0.377cm^2 area [name], [name]. ([id number]) and 0.038cm^3 volume. The wound is limited to skin breakdown. There is no tunneling or undermining noted. There is a small amount of serous drainage noted. The wound margin is indistinct and nonvisible. There is large (67-100%) red granulation within the wound bed. There is a small (1-33%) amount of necrotic tissue within the wound bed including Adherent Slough. The periwound skin appearance  exhibited: Localized Edema, Dry/Scaly, Erythema. The periwound skin appearance did not exhibit: Callus, Crepitus, Excoriation, Fluctuance, Friable, Induration, Rash, Scarring, Maceration, Moist, Atrophie Blanche, Cyanosis, Ecchymosis, Hemosiderin Staining, Mottled, Pallor, Rubor. The surrounding wound skin color is noted with erythema. Periwound temperature was noted as No Abnormality. The periwound has tenderness on palpation. 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 Procedures Wound #1 Wound #1 is a Venous Leg Ulcer located on the Left,Lateral Lower Leg . There was a Skin/Subcutaneous Tissue Debridement ([id number]) debridement with total area of 0.48 sq cm performed by . with the following instrument(s): Curette to remove Viable and Non-Viable tissue/material including Exudate, Fat, Fibrin/Slough, and Subcutaneous after achieving pain control using Lidocaine 4% Topical Solution. A time out was conducted prior to the start of the procedure. A Minimum amount of bleeding was controlled with Pressure. The procedure was tolerated well with a pain level of 0 throughout and a pain level of 0 following the procedure. Post Debridement Measurements: 0.8cm length x 0.6cm width x 0.2cm depth; 0.075cm^3 volume. Plan Wound Cleansing: [name], [name] ([id number]) Wound #1 Left,Lateral Lower Leg: Clean wound with Normal Saline. Anesthetic: Wound #1 Left,Lateral Lower Leg: Topical Lidocaine 4% cream applied to wound bed prior to debridement Primary Wound Dressing: Wound #1 Left,Lateral Lower Leg: Iodoflex Secondary Dressing: Wound #1 Left,Lateral Lower Leg: Dry Gauze Dressing Change Frequency: Wound #1 Left,Lateral Lower Leg: Change dressing every  week Follow-up Appointments: Wound #1 Left,Lateral Lower Leg: Return Appointment in 1 week. Edema Control: 2 Layer Lite Compression System - Left Lower Extremity cadexomer iodine. 2 layer compression. Electronic Signature(s) Signed: 03/16/2015 3:52:37 PM By: [name] MD Entered By: [name] on 03/16/2015 13:20:09 [name], [name] ([id number]) -------------------------------------------------------------------------------- SuperBill Details Patient Name: [name], [name]. Date of Service: 03/16/2015 Medical Record Number: [id number] Patient Account Number: [id number] Date of Birth/Sex: 1945/11/16 (69 y.o. 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

## 2015-03-17 NOTE — Progress Notes (Signed)
Amber, [name] ([id number]) Visit Report for 03/16/2015 Arrival Information Details Patient Name: Amber, [name]. Date of Service: 03/16/2015 11:30 AM Medical Record Number: [id number] Patient Account Number: [id number] Date of Birth/Sex: 1946-10-01 (69 y.o. Female) Treating RN: [name] Primary Care Physician: [name] Other Clinician: Referring Physician: Ria [name] Treating Physician/Extender: [name] in Treatment: 1 Visit Information History Since Last Visit Added or deleted any medications: No Patient Arrived: Ambulatory Any new allergies or adverse reactions: No Arrival Time: 11:48 Had a fall or experienced change in No Accompanied By: self activities of daily living that may affect Transfer Assistance: None risk of falls: Patient Identification Verified: Yes Signs or symptoms of abuse/neglect since last No Secondary Verification Process Yes visito Completed: Has Dressing in Place as Prescribed: Yes Patient Has Alerts: Yes Has Compression in Place as Prescribed: Yes Patient Alerts: NO BPs (R) Pain Present Now: Yes arm Electronic Signature(s) Signed: 03/16/2015 4:35:17 PM By: Amber Dresser RN Entered By: [name] on 03/16/2015 11:51:11 [place], [name] ([id number]) -------------------------------------------------------------------------------- Encounter Discharge Information Details Patient Name: Amber, [name]. Date of Service: 03/16/2015 11:30 AM Medical Record Number: [id number] Patient Account Number: [id number] Date of Birth/Sex: [birth year] (69 y.o. Female) Treating RN: [name] Primary Care Physician: [name] Other Clinician: Referring Physician: Ria [name] Treating Physician/Extender: [name] in Treatment: 1 Encounter Discharge Information Items Schedule Follow-up Appointment: No Medication Reconciliation completed No and provided to Patient/Care [name]: Provided on Clinical Summary of Care: 03/16/2015 Form  Type Recipient Paper Patient LP Electronic Signature(s) Signed: 03/16/2015 12:22:31 PM By: [name] Entered By: [name] on 03/16/2015 12:22:31 [name] ([id number]) -------------------------------------------------------------------------------- Lower Extremity Assessment Details Patient Name: Amber, [name]. Date of Service: 03/16/2015 11:30 AM Medical Record Number: [id number] Patient Account Number: [id number] Date of Birth/Sex: 25-Jun-1946 (69 y.o. Female) Treating RN: [name] Primary Care Physician: [name] Other Clinician: Referring Physician: Ria [name] Treating Physician/Extender: [name] in Treatment: 1 Edema Assessment Assessed: [Left: Yes] [Right: No] Edema: [Left: Ye] [Right: s] Calf Left: Right: Point of Measurement: 28 cm From Medial Instep 38.5 cm cm Ankle Left: Right: Point of Measurement: 10 cm From Medial Instep 24 cm cm Vascular Assessment Claudication: Claudication Assessment [Left:None] Pulses: Posterior Tibial Palpable: [Left:Yes] Dorsalis Pedis Palpable: [Left:Yes] Extremity colors, hair growth, and conditions: Extremity Color: [Left:Hyperpigmented] Hair Growth on Extremity: [Left:Yes] Temperature of Extremity: [Left:Warm] Capillary Refill: [Left:< 3 seconds] Dependent Rubor: [Left:No] Blanched when Elevated: [Left:No] Toe Nail Assessment Left: Right: Thick: Yes Discolored: Yes Deformed: Yes Improper Length and Hygiene: Yes Amber, [name] ([id number]) Electronic Signature(s) Signed: 03/16/2015 4:35:17 PM By: Amber Dresser RN Entered By: [name] on 03/16/2015 11:59:40 [place], [name] ([id number]) -------------------------------------------------------------------------------- Multi Wound Chart Details Patient Name: Amber, [name]. Date of Service: 03/16/2015 11:30 AM Medical Record Number: [id number] Patient Account Number: [id number] Date of Birth/Sex: 12-07-1945 (69 y.o. Female) Treating RN: [name],  RN, [name], Velva Harman Primary Care Physician: [name] Other Clinician: Referring Physician: Ria [name] Treating Physician/Extender: [name] in Treatment: 1 Vital Signs Height(in): 63 Pulse(bpm): 60 Weight(lbs): 214.7 Blood Pressure 138/81 (mmHg): Body Mass Index(BMI): 38 Temperature(F): 97.85 Respiratory Rate 16 (breaths/min): Photos: [1:No Photos] [N/A:N/A] Wound Location: [1:Left Lower Leg - Lateral] [N/A:N/A] Wounding Event: [1:Gradually Appeared] [N/A:N/A] Primary Etiology: [1:Venous Leg Ulcer] [N/A:N/A] Comorbid History: [1:Cataracts, Asthma, Sleep Apnea, Deep Vein Thrombosis, Hypertension, Peripheral Venous Disease, Osteoarthritis, Received Chemotherapy, Received Radiation] [N/A:N/A] Date Acquired: [1:11/05/2014] [N/A:N/A] [name] of Treatment: [1:1] [N/A:N/A] Wound Status: [1:Open] [N/A:N/A] Measurements L x W x D 0.8x0.6x0.1 [N/A:N/A] (cm) Area (cm) : [1:0.377] [  N/A:N/A] Volume (cm) : [1:0.038] [N/A:N/A] % Reduction in Area: [1:-14.20%] [N/A:N/A] % Reduction in Volume: -15.20% [N/A:N/A] Classification: [1:Full Thickness Without Exposed Support Structures] [N/A:N/A] Exudate Amount: [1:Small] [N/A:N/A] Exudate Type: [1:Serous] [N/A:N/A] Exudate Color: [1:amber] [N/A:N/A] Wound Margin: [1:Indistinct, nonvisible] [N/A:N/A] Granulation Amount: [1:Large (67-100%)] [N/A:N/A] Granulation Quality: [1:Red] [N/A:N/A] Necrotic Amount: Small (1-33%) N/A N/A Exposed Structures: Fascia: No N/A N/A Fat: No Tendon: No Muscle: No Joint: No Bone: No Limited to Skin Breakdown Epithelialization: None N/A N/A Periwound Skin Texture: Edema: Yes N/A N/A Excoriation: No Induration: No Callus: No Crepitus: No Fluctuance: No Friable: No Rash: No Scarring: No Periwound Skin Dry/Scaly: Yes N/A N/A Moisture: Maceration: No Moist: No Periwound Skin Color: Erythema: Yes N/A N/A Atrophie Blanche: No Cyanosis: No Ecchymosis: No Hemosiderin Staining: No Mottled:  No Pallor: No Rubor: No Temperature: No Abnormality N/A N/A Tenderness on Yes N/A N/A Palpation: Wound Preparation: Ulcer Cleansing: N/A N/A Rinsed/Irrigated with Saline Topical Anesthetic Applied: Other: Lidocaine 4% Ointment Treatment Notes Electronic Signature(s) Signed: 03/16/2015 5:48:59 PM By: Regan Lemming [name], RN Entered By: Regan Lemming on 03/16/2015 12:11:34 Amber, [name] ([id number]) -------------------------------------------------------------------------------- Multi-Disciplinary Care Plan Details Patient Name: Amber, [name]. Date of Service: 03/16/2015 11:30 AM Medical Record Number: [id number] Patient Account Number: [id number] Date of Birth/Sex: [birth year] (69 y.o. Female) Treating RN: Afful, RN, [name], Velva Harman Primary Care Physician: [name] Other Clinician: Referring Physician: Ria [name] Treating Physician/Extender: [name] in Treatment: 1 Active Inactive Abuse / Safety / Falls / Self Care Management Nursing Diagnoses: Potential for falls Goals: Patient will remain injury free Date Initiated: 03/09/2015 Goal Status: Active Patient/caregiver will verbalize understanding of skin care regimen Date Initiated: 03/09/2015 Goal Status: Active Patient/caregiver will verbalize/demonstrate measures taken to prevent injury and/or falls Date Initiated: 03/09/2015 Goal Status: Active Patient/caregiver will verbalize/demonstrate understanding of what to do in case of emergency Date Initiated: 03/09/2015 Goal Status: Active Interventions: Assess fall risk on admission and as needed Assess: immobility, friction, shearing, incontinence upon admission and as needed Assess impairment of mobility on admission and as needed per policy Provide education on fall prevention Provide education on safe transfers Notes: Nutrition Nursing Diagnoses: Potential for alteratiion in Nutrition/Potential for imbalanced nutrition Goals: Patient/caregiver agrees to and verbalizes  understanding of need to use nutritional supplements and/or vitamins as prescribed JACEY, ECKERSON ([id number]) Date Initiated: 03/09/2015 Goal Status: Active Interventions: Assess patient nutrition upon admission and as needed per policy Provide education on nutrition Treatment Activities: Education provided on Nutrition : 03/09/2015 Notes: Orientation to the Wound Care Program Nursing Diagnoses: Knowledge deficit related to the wound healing center program Goals: Patient/caregiver will verbalize understanding of the [facility] Date Initiated: 03/09/2015 Goal Status: Active Interventions: Provide education on orientation to the wound center Notes: Venous Leg Ulcer Nursing Diagnoses: Actual venous Insuffiency (use after diagnosis is confirmed) Knowledge deficit related to disease process and management Goals: Non-invasive venous studies are completed as ordered Date Initiated: 03/09/2015 Goal Status: Active Patient will maintain optimal edema control Date Initiated: 03/09/2015 Goal Status: Active Patient/caregiver will verbalize understanding of disease process and disease management Date Initiated: 03/09/2015 Goal Status: Active Verify adequate tissue perfusion prior to therapeutic compression application Date Initiated: 03/09/2015 Goal Status: Active Amber, [name] ([id number]) Interventions: Assess peripheral edema status every visit. Compression as ordered Provide education on venous insufficiency Treatment Activities: Non-invasive vascular studies : 03/16/2015 Therapeutic compression applied : 03/16/2015 Notes: Wound/Skin Impairment Nursing Diagnoses: Impaired tissue integrity Knowledge deficit related to ulceration/compromised skin integrity Goals: Patient/caregiver will verbalize understanding of skin  care regimen Date Initiated: 03/09/2015 Goal Status: Active Ulcer/skin breakdown will heal within 14 [name] Date Initiated: 03/09/2015 Goal Status:  Active Interventions: Assess patient/caregiver ability to obtain necessary supplies Assess patient/caregiver ability to perform ulcer/skin care regimen upon admission and as needed Assess ulceration(s) every visit Provide education on ulcer and skin care Treatment Activities: Skin care regimen initiated : 03/16/2015 Topical wound management initiated : 03/16/2015 Notes: Electronic Signature(s) Signed: 03/16/2015 5:48:59 PM By: Regan Lemming [name], RN Entered By: Regan Lemming on 03/16/2015 12:11:20 [name] ([id number]) -------------------------------------------------------------------------------- Pain Assessment Details Patient Name: Amber, [name]. Date of Service: 03/16/2015 11:30 AM Medical Record Number: [id number] Patient Account Number: [id number] Date of Birth/Sex: 07/16/1946 (69 y.o. Female) Treating RN: [name] Primary Care Physician: [name] Other Clinician: Referring Physician: Ria [name] Treating Physician/Extender: [name] in Treatment: 1 Active Problems Location of Pain Severity and Description of Pain Patient Has Paino Yes Site Locations With Dressing Change: Yes Duration of the Pain. Constant / Intermittento Intermittent Rate the pain. Current Pain Level: 0 Worst Pain Level: 6 Least Pain Level: 0 Character of Pain Describe the Pain: Burning Pain Management and Medication Current Pain Management: Electronic Signature(s) Signed: 03/16/2015 4:35:17 PM By: Amber Dresser RN Entered By: [name] on 03/16/2015 11:51:35 [place], [name] ([id number]) -------------------------------------------------------------------------------- Patient/Caregiver Education Details Patient Name: Amber, [name]. Date of Service: 03/16/2015 11:30 AM Medical Record Number: [id number] Patient Account Number: [id number] Date of Birth/Gender: 01/10/1946 (69 y.o. Female) Treating RN: [name] Primary Care Physician: [name] Other  Clinician: Referring Physician: Ria [name] Treating Physician/Extender: [name] in Treatment: 1 Education Assessment Education Provided To: Patient Education Topics Provided Safety: Methods: Explain/Verbal Responses: State content correctly Venous: Methods: Explain/Verbal Responses: State content correctly Wound Debridement: Methods: Explain/Verbal Responses: State content correctly Wound/Skin Impairment: Methods: Explain/Verbal Responses: State content correctly Electronic Signature(s) Signed: 03/16/2015 4:35:17 PM By: Amber Dresser RN Entered By: [name] on 03/16/2015 12:23:03 [name] ([id number]) -------------------------------------------------------------------------------- Wound Assessment Details Patient Name: Amber, [name]. Date of Service: 03/16/2015 11:30 AM Medical Record Number: [id number] Patient Account Number: [id number] Date of Birth/Sex: [birth year] (69 y.o. Female) Treating RN: [name] Primary Care Physician: [name] Other Clinician: Referring Physician: Ria [name] Treating Physician/Extender: [name] in Treatment: 1 Wound Status Wound Number: 1 Primary Venous Leg Ulcer Etiology: Wound Location: Left Lower Leg - Lateral Wound Open Wounding Event: Gradually Appeared Status: Date Acquired: 11/05/2014 Comorbid Cataracts, Asthma, Sleep Apnea, Deep [name] Of Treatment: 1 History: Vein Thrombosis, Hypertension, Clustered Wound: No Peripheral Venous Disease, Osteoarthritis, Received Chemotherapy, Received Radiation Photos Photo Uploaded By: [name] on 03/16/2015 12:33:05 Wound Measurements Length: (cm) 0.8 Width: (cm) 0.6 Depth: (cm) 0.1 Area: (cm) 0.377 Volume: (cm) 0.038 % Reduction in Area: -14.2% % Reduction in Volume: -15.2% Epithelialization: None Tunneling: No Undermining: No Wound Description Full Thickness Without Exposed Classification: Support Structures Wound Margin: Indistinct,  nonvisible Exudate Small Amount: Exudate Type: Serous Exudate Color: amber Foul Odor After Cleansing: No Wound Bed Amber, [name]. ([id number]) Granulation Amount: Large (67-100%) Exposed Structure Granulation Quality: Red Fascia Exposed: No Necrotic Amount: Small (1-33%) Fat Layer Exposed: No Necrotic Quality: Adherent Slough Tendon Exposed: No Muscle Exposed: No Joint Exposed: No Bone Exposed: No Limited to Skin Breakdown Periwound Skin Texture Texture Color No Abnormalities Noted: No No Abnormalities Noted: No Callus: No Atrophie Blanche: No Crepitus: No Cyanosis: No Excoriation: No Ecchymosis: No Fluctuance: No Erythema: Yes Friable: No Hemosiderin Staining: No Induration: No Mottled: No Localized Edema: Yes Pallor: No Rash: No Rubor: No Scarring:  No Temperature / Pain Moisture Temperature: No Abnormality No Abnormalities Noted: No Tenderness on Palpation: Yes Dry / Scaly: Yes Maceration: No Moist: No Wound Preparation Ulcer Cleansing: Rinsed/Irrigated with Saline Topical Anesthetic Applied: Other: Lidocaine 4% Ointment , Treatment Notes Wound #1 (Left, Lateral Lower Leg) 1. Cleansed with: Clean wound with Normal Saline 2. Anesthetic Topical Lidocaine 4% cream to wound bed prior to debridement 4. Dressing Applied: Iodoflex 5. Secondary Dressing Applied ABD and Kerlix/Conform 7. Secured with 2 Layer Lite Compression System - Left Lower Extremity Electronic Signature(s) Signed: 03/16/2015 4:35:17 PM By: Amber Dresser RN [place], Keiarah J. ([id number]) Entered By: [name] on 03/16/2015 12:03:23 [name] ([id number]) -------------------------------------------------------------------------------- Vitals Details Patient Name: Amber, [name]. Date of Service: 03/16/2015 11:30 AM Medical Record Number: [id number] Patient Account Number: [id number] Date of Birth/Sex: 05-Nov-1946 (69 y.o. Female) Treating RN: [name] Primary Care  Physician: [name] Other Clinician: Referring Physician: Ria [name] Treating Physician/Extender: [name] in Treatment: 1 Vital Signs Time Taken: 11:50 Temperature (F): 97.85 Height (in): 63 Pulse (bpm): 60 Weight (lbs): 214.7 Respiratory Rate (breaths/min): 16 Body Mass Index (BMI): 38 Blood Pressure (mmHg): 138/81 Reference Range: 80 - 120 mg / dl Electronic Signature(s) Signed: 03/16/2015 4:35:17 PM By: Amber Dresser RN Entered By: [name] on 03/16/2015 11:51:50

## 2015-03-18 ENCOUNTER — Encounter: Payer: Self-pay | Admitting: Hematology & Oncology

## 2015-03-18 ENCOUNTER — Other Ambulatory Visit (HOSPITAL_BASED_OUTPATIENT_CLINIC_OR_DEPARTMENT_OTHER): Payer: Medicare Other

## 2015-03-18 ENCOUNTER — Ambulatory Visit (HOSPITAL_BASED_OUTPATIENT_CLINIC_OR_DEPARTMENT_OTHER): Payer: Medicare Other | Admitting: Hematology & Oncology

## 2015-03-18 VITALS — BP 119/72 | HR 67 | Temp 98.4°F | Resp 16 | Ht 62.0 in | Wt 213.0 lb

## 2015-03-18 DIAGNOSIS — C4361 Malignant melanoma of right upper limb, including shoulder: Secondary | ICD-10-CM

## 2015-03-18 DIAGNOSIS — D039 Melanoma in situ, unspecified: Secondary | ICD-10-CM | POA: Diagnosis not present

## 2015-03-18 DIAGNOSIS — L97929 Non-pressure chronic ulcer of unspecified part of left lower leg with unspecified severity: Secondary | ICD-10-CM

## 2015-03-18 DIAGNOSIS — C439 Malignant melanoma of skin, unspecified: Secondary | ICD-10-CM | POA: Diagnosis not present

## 2015-03-18 DIAGNOSIS — C779 Secondary and unspecified malignant neoplasm of lymph node, unspecified: Secondary | ICD-10-CM

## 2015-03-18 LAB — CBC WITH DIFFERENTIAL (CANCER CENTER ONLY)
BASO#: 0 10*3/uL (ref 0.0–0.2)
BASO%: 0.2 % (ref 0.0–2.0)
EOS%: 3.3 % (ref 0.0–7.0)
Eosinophils Absolute: 0.2 10*3/uL (ref 0.0–0.5)
HCT: 40 % (ref 34.8–46.6)
HGB: 13.2 g/dL (ref 11.6–15.9)
LYMPH#: 1.5 10*3/uL (ref 0.9–3.3)
LYMPH%: 23.9 % (ref 14.0–48.0)
MCH: 30.4 pg (ref 26.0–34.0)
MCHC: 33 g/dL (ref 32.0–36.0)
MCV: 92 fL (ref 81–101)
MONO#: 0.5 10*3/uL (ref 0.1–0.9)
MONO%: 7.8 % (ref 0.0–13.0)
NEUT#: 4.1 10*3/uL (ref 1.5–6.5)
NEUT%: 64.8 % (ref 39.6–80.0)
Platelets: 191 10*3/uL (ref 145–400)
RBC: 4.34 10*6/uL (ref 3.70–5.32)
RDW: 13 % (ref 11.1–15.7)
WBC: 6.3 10*3/uL (ref 3.9–10.0)

## 2015-03-18 LAB — COMPREHENSIVE METABOLIC PANEL
ALT: 15 U/L (ref 0–35)
AST: 10 U/L (ref 0–37)
Albumin: 4 g/dL (ref 3.5–5.2)
Alkaline Phosphatase: 63 U/L (ref 39–117)
BUN: 17 mg/dL (ref 6–23)
CO2: 26 mEq/L (ref 19–32)
Calcium: 9.3 mg/dL (ref 8.4–10.5)
Chloride: 105 mEq/L (ref 96–112)
Creatinine, Ser: 0.8 mg/dL (ref 0.50–1.10)
Glucose, Bld: 108 mg/dL — ABNORMAL HIGH (ref 70–99)
Potassium: 3.8 mEq/L (ref 3.5–5.3)
Sodium: 143 mEq/L (ref 135–145)
Total Bilirubin: 0.5 mg/dL (ref 0.2–1.2)
Total Protein: 6.9 g/dL (ref 6.0–8.3)

## 2015-03-18 LAB — LACTATE DEHYDROGENASE: LDH: 168 U/L (ref 94–250)

## 2015-03-18 NOTE — Progress Notes (Signed)
Hematology and Oncology Follow Up Visit  [name] [id number] 1945/12/08 69 y.o. 03/18/2015   Principle Diagnosis:  Stage IIIC (T3b N3 M0) melanoma of the right shoulder.  Current Therapy:    Observation     Interim History:  Ms.  [name] is back for followup. We see her every 6 months. Unfortunately, she has issues with her left leg. She has ulcer in the left lower leg. She is seen a vascular doctor. I think she is seeing a doctor at the wound Center. She has had some procedures done. She says that this is a venous problem. I would think that would be an arterial problem but she says that is not the case.  Otherwise, she is doing quite well. [name] having no problems with cough or shortness of breath. She has chronic neck issues but does not need any further physical therapy for this.  She has had no change in bowel or bladder habits. [name] had no nausea or vomiting. Patient has had no swelling. Particular, there's been no swelling in her left leg.  Overall, her performance status is ECOG 1.  She still is living in an RV. She seems to be enjoying this.  Medications:  Current outpatient prescriptions:  .  Acetaminophen (TYLENOL ARTHRITIS EXT RELIEF PO), Take 1 tablet by mouth daily as needed., Disp: , Rfl:  .  aspirin 81 MG tablet, Take 81 mg by mouth daily., Disp: , Rfl:  .  furosemide (LASIX) 20 MG tablet, Take 1 tablet (20 mg total) by mouth daily as needed for fluid. (Patient taking differently: Take 20 mg by mouth every other day. ), Disp: 30 tablet, Rfl: 3 .  KLOR-CON M10 10 MEQ tablet, TAKE 2 TABLETS BY MOUTH DAILY., Disp: 90 tablet, Rfl: 3 .  losartan-hydrochlorothiazide (HYZAAR) 50-12.5 MG per tablet, TAKE 1 TABLET BY MOUTH DAILY., Disp: 30 tablet, Rfl: 6 .  montelukast (SINGULAIR) 10 MG tablet, TAKE 1 TABLET BY MOUTH DAILY, Disp: 30 tablet, Rfl: 11 .  Multiple Vitamins-Minerals (MULTIVITAMIN PO), Take by mouth every morning., Disp: , Rfl:  .  PROAIR HFA 108 (90 BASE)  MCG/ACT inhaler, INHALE 2 PUFFS INTO LUNGS EVERY 6 HOURS AS NEEDED FOR WHEEZING OR SHORTNESS OF BREATH, Disp: 8.5 Inhaler, Rfl: 3 .  Probiotic Product (PROBIOTIC DAILY PO), Take by mouth every morning., Disp: , Rfl:  .  Vitamin D, Ergocalciferol, (DRISDOL) 50000 UNITS CAPS capsule, Take 1 capsule (50,000 Units total) by mouth every 7 (seven) days., Disp: 4 capsule, Rfl: 5  Allergies:  Allergies  Allergen Reactions  . Sulfa Drugs Cross Reactors Swelling    Sulfa medications-swelling of face    Past Medical History, Surgical history, Social history, and Family History were reviewed and updated.  Review of Systems: As above  Physical Exam:  height is 5\' 2"  (1.575 m) and weight is 213 lb (96.616 kg). Her oral temperature is 98.4 F (36.9 C). Her blood pressure is 119/72 and her pulse is 67. Her respiration is 16.   Portable and well-nourished white female. Head and neck exam is no ocular or oral lesions. There are no palpable cervical or supraclavicular lymph nodes. Lungs are clear. Cardiac exam regular rate and rhythm with no murmurs rubs or bruits. Abdomen soft. Has good bowel sounds. Is no fluid wave. There is a palpable hepato- splenomegaly. Exam shows a wide local excision scar in the posterior right shoulder region. This well-healed. No tenderness over the spine ribs or hips. Axillary exam shows no bilateral axillary adenopathy.  Extremities shows no clubbing cyanosis or edema. She has a bandage on the left lower leg in the upper portion. She has a compression stocking on the left leg. She has decent pulses in her distal extremities. There is mild lymphedema noted in the right arm. Skin exam shows no suspicious skin lesions.  Lab Results  Component Value Date   WBC 6.3 03/18/2015   HGB 13.2 03/18/2015   HCT 40.0 03/18/2015   MCV 92 03/18/2015   PLT 191 03/18/2015     Chemistry      Component Value Date/Time   NA 138 12/23/2014 1241   NA 147* 09/17/2014 1013   NA 146 10/21/2012    K 3.7 12/23/2014 1241   K 3.8 09/17/2014 1013   CL 104 12/23/2014 1241   CL 104 09/17/2014 1013   CO2 31 12/23/2014 1241   CO2 27 09/17/2014 1013   BUN 22 12/23/2014 1241   BUN 21 09/17/2014 1013   BUN 18 10/21/2012   CREATININE 0.80 12/23/2014 1241   CREATININE 1.0 09/17/2014 1013      Component Value Date/Time   CALCIUM 9.6 12/23/2014 1241   CALCIUM 9.3 09/17/2014 1013   ALKPHOS 49 09/17/2014 1013   ALKPHOS 55 04/29/2013 0956   AST 16 09/17/2014 1013   AST 12 04/29/2013 0956   ALT 21 09/17/2014 1013   ALT 16 04/29/2013 0956   BILITOT 0.60 09/17/2014 1013   BILITOT 0.4 04/29/2013 0956         Impression and Plan: [name] is a 69 year old white female with stage IIIc melanoma of the right shoulder. She did undergo resection. She had 8 positive lymph nodes. She completed adjuvant interferon therapy in December of 2012. She had radiation therapy to the right axilla.  I see no evidence of recurrence. However, she is at significant risk for recurrence. She worries about recurrence.  Hopefully, the issues with her left leg will improve. Hopefully they can improve the circulation so that this ulceration can heal up.  I don't think anything with her left leg is related to her having myeloma or her past therapies.  I don't see any need for special studies right now.  We will plan to get her back in another 6 months.   [name], MD 5/13/20164:20 PM

## 2015-03-23 ENCOUNTER — Encounter: Payer: Medicare Other | Admitting: Surgery

## 2015-03-23 DIAGNOSIS — Z87891 Personal history of nicotine dependence: Secondary | ICD-10-CM | POA: Diagnosis not present

## 2015-03-23 DIAGNOSIS — I872 Venous insufficiency (chronic) (peripheral): Secondary | ICD-10-CM | POA: Diagnosis not present

## 2015-03-23 DIAGNOSIS — Z85820 Personal history of malignant melanoma of skin: Secondary | ICD-10-CM | POA: Diagnosis not present

## 2015-03-23 DIAGNOSIS — L97229 Non-pressure chronic ulcer of left calf with unspecified severity: Secondary | ICD-10-CM | POA: Diagnosis not present

## 2015-03-23 DIAGNOSIS — I83222 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 of calf and inflammation: Secondary | ICD-10-CM | POA: Diagnosis not present

## 2015-03-23 DIAGNOSIS — R600 Localized edema: Secondary | ICD-10-CM | POA: Diagnosis not present

## 2015-03-24 NOTE — Progress Notes (Signed)
PARISS, HOMMES ([id number]) Visit Report for 03/23/2015 Chief Complaint Document Details Patient Name: [name], [name]. Date of Service: 03/23/2015 11:30 AM Medical Record Number: [id number] Patient Account Number: [id number] Date of Birth/Sex: [birth year] (69 y.o. Female) Treating RN: Primary Care Physician: [name] Other Clinician: Referring Physician: [name] Treating Physician/Extender: Weeks in Treatment: 2 Information Obtained from: Patient Chief Complaint Left calf venous stasis ulceration. Electronic Signature(s) Signed: 03/23/2015 3:18:58 PM By: [name] MD Entered By: [name] on 03/23/2015 12:24:32 MALYIA, MORO ([id number]) -------------------------------------------------------------------------------- Debridement Details Patient Name: [name], [name]. Date of Service: 03/23/2015 11:30 AM Medical Record Number: [id number] Patient Account Number: [id number] Date of Birth/Sex: 13-May-1946 (69 y.o. Female) Treating RN: Primary Care Physician: [name] Other Clinician: Referring Physician: [name] Treating Physician/Extender: Weeks in Treatment: 2 Debridement Performed for Wound #1 Left,Lateral Lower Leg Assessment: Performed By: Physician , Debridement: Open Wound/Selective Debridement Selective Description: Pre-procedure Yes Verification/Time Out Taken: Start Time: 11:25 Pain Control: Other : lidocaine 4% Level: Non-Viable Tissue Total Area Debrided (L x 0.3 (cm) x 0.3 (cm) = 0.09 (cm) W): Tissue and other Non-Viable, Exudate, Fibrin/Slough material debrided: Instrument: Curette Bleeding: None End Time: 11:27 Procedural Pain: 2 Post Procedural Pain: 0 Response to Treatment: Procedure was tolerated well Post Debridement Measurements of Total Wound Length: (cm) 0.3 Width: (cm) 0.3 Depth: (cm) 0.2 Volume: (cm) 0.014 Electronic Signature(s) Signed: 03/23/2015 3:18:58 PM By: [name]  MD Entered By: [name] on 03/23/2015 12:24:24 [name] ([id number]) -------------------------------------------------------------------------------- HPI Details Patient Name: [name], [name]. Date of Service: 03/23/2015 11:30 AM Medical Record Number: [id number] Patient Account Number: [id number] Date of Birth/Sex: [birth year] (69 y.o. Female) Treating RN: Primary Care Physician: [name] Other Clinician: Referring Physician: [name] Treating Physician/Extender: Weeks in Treatment: 2 History of Present Illness HPI Description: Pleasant 69 year old with history of chronic venous insufficiency. No diabetes or peripheral vascular disease. Left ABI 1.29. Questionable history of left lower extremity DVT. She developed a recurrent ulceration on her left lateral calf in December 2015, which she attributes to poor diet and subsequent lower extremity edema. She underwent endovenous laser ablation of her left greater saphenous vein in 2010. She underwent laser ablation of accessory branch of left GSV in April 2016 by Dr. Kellie Simmering at [facility]. She was previously wearing Unna boots, which she tolerated well. Tolerating 2 layer compression and cadexomer iodine. She returns to clinic for follow-up and is without new complaints. She denies any significant pain at this time. She reports persistent pain with pressure. No claudication or ischemic rest pain. No fever or chills. No significant drainage. Electronic Signature(s) Signed: 03/23/2015 3:18:58 PM By: [name] MD Entered By: [name] on 03/23/2015 12:24:54 [name] ([id number]) -------------------------------------------------------------------------------- Physical Exam Details Patient Name: [name], [name]. Date of Service: 03/23/2015 11:30 AM Medical Record Number: [id number] Patient Account Number: [id number] Date of Birth/Sex: 09-19-1946 (69 y.o. Female) Treating RN: Primary Care  Physician: [name] Other Clinician: Referring Physician: [name] Treating Physician/Extender: Weeks in Treatment: 2 Constitutional . Pulse regular. Respirations normal and unlabored. Afebrile. . Notes Left lateral calf ulceration improved with compression. Full-thickness. No exposed deep structures. No evidence for infection. No features to suggest malignancy/melanoma. 1+ pitting edema. No palpable pedal pulses. Dopplerable DP and PT, both monophasic. ABIs abnormally elevated at 1.29. Electronic Signature(s) Signed: 03/23/2015 3:18:58 PM By: [name] MD Entered By: [name] on 03/23/2015 12:25:28 [name] ([id number]) -------------------------------------------------------------------------------- Physician Orders Details Patient  Name: [name], [name]. Date of Service: 03/23/2015 11:30 AM Medical Record Number: [id number] Patient Account Number: [id number] Date of Birth/Sex: [name] 24, 1946 (69 y.o. Female) Treating RN: Cornell Barman Primary Care Physician: [name] Other Clinician: Referring Physician: [name] Treating Physician/Extender: [name] in Treatment: 2 Verbal / Phone Orders: Yes Clinician: Cornell Barman Read Back and Verified: Yes Diagnosis Coding Wound Cleansing Wound #1 Left,Lateral Lower Leg o Clean wound with Normal Saline. Anesthetic Wound #1 Left,Lateral Lower Leg o Topical Lidocaine 4% cream applied to wound bed prior to debridement Primary Wound Dressing Wound #1 Left,Lateral Lower Leg o Iodoflex Secondary Dressing Wound #1 Left,Lateral Lower Leg o Dry Gauze Dressing Change Frequency Wound #1 Left,Lateral Lower Leg o Change dressing every week Follow-up Appointments Wound #1 Left,Lateral Lower Leg o Return Appointment in 1 week. Edema Control Wound #1 Left,Lateral Lower Leg o 2 Layer Lite Compression System - Left Lower Extremity o Elevate legs to the level of the heart and pump  ankles as often as possible o Other: - Order Juxtilite Notes Order Juxtilite; 38cm to knee Electronic Signature(s) JALAIYAH, THROGMORTON ([id number]) Signed: 03/23/2015 5:16:59 PM By: [name], RN, [name], [name], [name] Entered By: [name], RN, [name], Kim on 03/23/2015 11:33:21 CYRA, SPADER ([id number]) -------------------------------------------------------------------------------- Problem List Details Patient Name: SHERRONDA, SWEIGERT. Date of Service: 03/23/2015 11:30 AM Medical Record Number: [id number] Patient Account Number: [id number] Date of Birth/Sex: November 04, 1946 (69 y.o. Female) Treating RN: Primary Care Physician: [name] Other Clinician: Referring Physician: [name] Treating Physician/Extender: Weeks in Treatment: 2 Active Problems ICD-10 Encounter Code Description Active Date Diagnosis I83.222 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 of 03/09/2015 Yes calf and inflammation I87.2 Venous insufficiency (chronic) (peripheral) 03/09/2015 Yes Z85.820 Personal history of malignant melanoma of skin 03/09/2015 Yes Inactive Problems Resolved Problems Electronic Signature(s) Signed: 03/23/2015 3:18:58 PM By: [name] MD Entered By: [name] on 03/23/2015 12:23:55 Rattigan, [name] ([id number]) -------------------------------------------------------------------------------- Progress Note Details Patient Name: MAXIMA, SKELTON. Date of Service: 03/23/2015 11:30 AM Medical Record Number: [id number] Patient Account Number: [id number] Date of Birth/Sex: 04-27-1946 (69 y.o. Female) Treating RN: Primary Care Physician: [name] Other Clinician: Referring Physician: [name] Treating Physician/Extender: Weeks in Treatment: 2 Subjective Chief Complaint Information obtained from Patient Left calf venous stasis ulceration. History of Present Illness (HPI) Pleasant 69 year old with history of chronic venous insufficiency. No diabetes or  peripheral vascular disease. Left ABI 1.29. Questionable history of left lower extremity DVT. She developed a recurrent ulceration on her left lateral calf in December 2015, which she attributes to poor diet and subsequent lower extremity edema. She underwent endovenous laser ablation of her left greater saphenous vein in 2010. She underwent laser ablation of accessory branch of left GSV in April 2016 by Dr. Kellie Simmering at [facility]. She was previously wearing Unna boots, which she tolerated well. Tolerating 2 layer compression and cadexomer iodine. She returns to clinic for follow-up and is without new complaints. She denies any significant pain at this time. She reports persistent pain with pressure. No claudication or ischemic rest pain. No fever or chills. No significant drainage. Objective Constitutional Pulse regular. Respirations normal and unlabored. Afebrile. Vitals Time Taken: 11:10 AM, Height: 63 in, Weight: 214.7 lbs, BMI: 38, Temperature: 98.3 F, Pulse: 63 bpm, Respiratory Rate: 16 breaths/min, Blood Pressure: 145/85 mmHg. General Notes: Left lateral calf ulceration improved with compression. Full-thickness. No exposed deep structures. No evidence for infection. No features to suggest malignancy/melanoma. 1+ pitting edema. No palpable pedal pulses. Dopplerable  DP and PT, both monophasic. ABIs abnormally elevated at 1.29. Integumentary (Hair, Skin) Wound #1 status is Open. Original cause of wound was Gradually Appeared. The wound is located on the Left,Lateral Lower Leg. The wound measures 0.3cm length x 0.3cm width x 0.1cm depth; 0.071cm^2 area Chirino, [place] ([id number]) and 0.007cm^3 volume. The wound is limited to skin breakdown. There is no tunneling or undermining noted. There is a small amount of serous drainage noted. The wound margin is indistinct and nonvisible. There is large (67-100%) red granulation within the wound bed. There is a small (1-33%) amount of  necrotic tissue within the wound bed including Adherent Slough. The periwound skin appearance exhibited: Localized Edema, Dry/Scaly, Erythema. The periwound skin appearance did not exhibit: Callus, Crepitus, Excoriation, Fluctuance, Friable, Induration, Rash, Scarring, Maceration, Moist, Atrophie Blanche, Cyanosis, Ecchymosis, Hemosiderin Staining, Mottled, Pallor, Rubor. The surrounding wound skin color is noted with erythema. Periwound temperature was noted as No Abnormality. The periwound has tenderness on palpation. Assessment Active Problems ICD-10 I83.222 - 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 of calf and inflammation I87.2 - Venous insufficiency (chronic) (peripheral) Z85.820 - Personal history of malignant melanoma of skin Left lateral calf venous stasis ulceration. Procedures Wound #1 Wound #1 is a Venous Leg Ulcer located on the Left,Lateral Lower Leg . There was a Non-Viable Tissue Open Wound/Selective [phone]) debridement with total area of 0.09 sq cm performed by . with the following instrument(s): Curette to remove Non-Viable tissue/material including Exudate and Fibrin/Slough after achieving pain control using Other (lidocaine 4%). A time out was conducted prior to the start of the procedure. There was no bleeding. The procedure was tolerated well with a pain level of 2 throughout and a pain level of 0 following the procedure. Post Debridement Measurements: 0.3cm length x 0.3cm width x 0.2cm depth; 0.014cm^3 volume. Plan Wound Cleansing: Wound #1 Left,Lateral Lower Leg: MADICYN, MESINA. ([id number]) Clean wound with Normal Saline. Anesthetic: Wound #1 Left,Lateral Lower Leg: Topical Lidocaine 4% cream applied to wound bed prior to debridement Primary Wound Dressing: Wound #1 Left,Lateral Lower Leg: Iodoflex Secondary Dressing: Wound #1 Left,Lateral Lower Leg: Dry Gauze Dressing Change Frequency: Wound #1 Left,Lateral Lower Leg: Change  dressing every week Follow-up Appointments: Wound #1 Left,Lateral Lower Leg: Return Appointment in 1 week. Edema Control: Wound #1 Left,Lateral Lower Leg: 2 Layer Lite Compression System - Left Lower Extremity Elevate legs to the level of the heart and pump ankles as often as possible Other: - Order Juxtilite General Notes: Order Juxtilite; 38cm to knee Continue cadexomer iodine and 2 layer compression until healed. Order juxtalite. Electronic Signature(s) Signed: 03/23/2015 3:18:58 PM By: [name] MD Entered By: [name] on 03/23/2015 12:26:49 [name], STAUDER ([id number]) -------------------------------------------------------------------------------- SuperBill Details Patient Name: DIERRA, RIESGO. Date of Service: 03/23/2015 Medical Record Number: [id number] Patient Account Number: [id number] Date of Birth/Sex: 1945/11/28 (69 y.o. Female) Treating RN: Primary Care Physician: [name] Other Clinician: Referring Physician: [name] Treating Physician/Extender: Weeks in Treatment: 2 Diagnosis Coding ICD-10 Codes Code Description I94.854 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 of calf and inflammation I87.2 Venous insufficiency (chronic) (peripheral) Z85.820 Personal history of malignant melanoma of skin Facility Procedures CPT4: Description Modifier Quantity Code [id number] - DEBRIDE WOUND 1ST 20 SQ CM OR < 1 ICD-10 Description Diagnosis I83.222 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 of calf and inflammation Physician Procedures CPT4: Description Modifier Quantity Code [id number] - WC PHYS DEBR WO ANESTH 20 SQ  CM 1 ICD-10 Description Diagnosis I83.222 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 of calf and inflammation Electronic Signature(s) Signed: 03/23/2015 3:18:58 PM By: [name] MD Entered By: [name] on 03/23/2015 12:27:10

## 2015-03-24 NOTE — Progress Notes (Signed)
[name], [name] ([id number]) Visit Report for 03/23/2015 Arrival Information Details Patient Name: [name], [name]. Date of Service: 03/23/2015 11:30 AM Medical Record Number: [id number] Patient Account Number: [id number] Date of Birth/Sex: 1946/08/20 (70 y.o. Female) Treating RN: Cornell Barman Primary Care Physician: [name] Other Clinician: Referring Physician: [name] Treating Physician/Extender: BURNS, Charlean Sanfilippo in Treatment: 2 Visit Information History Since Last Visit Added or deleted any medications: No Patient Arrived: Ambulatory Any new allergies or adverse reactions: No Arrival Time: 11:11 Had a fall or experienced change in No Accompanied By: self activities of daily living that may affect Transfer Assistance: None risk of falls: Patient Identification Verified: Yes Signs or symptoms of abuse/neglect since last No Secondary Verification Process Yes visito Completed: Hospitalized since last visit: No Patient Has Alerts: Yes Has Dressing in Place as Prescribed: Yes Patient Alerts: NO BPs (R) Has Compression in Place as Prescribed: Yes arm Pain Present Now: No Electronic Signature(s) Signed: 03/23/2015 4:52:16 PM By: Sharon Mt Entered By: Sharon Mt on 03/23/2015 16:52:16 Schauf, [name] ([id number]) -------------------------------------------------------------------------------- Encounter Discharge Information Details Patient Name: [name], [name]. Date of Service: 03/23/2015 11:30 AM Medical Record Number: [id number] Patient Account Number: [id number] Date of Birth/Sex: 02/02/1946 (69 y.o. Female) Treating RN: Primary Care Physician: [name] Other Clinician: Referring Physician: [name] Treating Physician/Extender: Weeks in Treatment: 2 Encounter Discharge Information Items Schedule Follow-up Appointment: No Medication Reconciliation completed No and provided to Patient/Care Kaitlan Bin: Provided on Clinical Summary of  Care: 03/23/2015 Form Type Recipient Paper Patient LP Electronic Signature(s) Signed: 03/23/2015 11:44:53 AM By: Ruthine Dose Entered By: Ruthine Dose on 03/23/2015 11:44:53 Abraha, [name] ([id number]) -------------------------------------------------------------------------------- Lower Extremity Assessment Details Patient Name: [name], [name]. Date of Service: 03/23/2015 11:30 AM Medical Record Number: [id number] Patient Account Number: [id number] Date of Birth/Sex: 08/22/1946 (69 y.o. Female) Treating RN: Cornell Barman Primary Care Physician: [name] Other Clinician: Referring Physician: [name] Treating Physician/Extender: [name] in Treatment: 2 Edema Assessment Assessed: [Left: No] [Right: No] E[Left: dema] [Right: :] Calf Left: Right: Point of Measurement: 28 cm From Medial Instep 37.5 cm cm Ankle Left: Right: Point of Measurement: 10 cm From Medial Instep 24.5 cm cm Vascular Assessment Pulses: Posterior Tibial Palpable: [Left:Yes] Dorsalis Pedis Palpable: [Left:Yes] Extremity colors, hair growth, and conditions: Extremity Color: [Left:Normal] Hair Growth on Extremity: [Left:Yes] Temperature of Extremity: [Left:Warm] Capillary Refill: [Left:< 3 seconds] Toe Nail Assessment Left: Right: Thick: Yes Discolored: Yes Deformed: Yes Improper Length and Hygiene: Yes Electronic Signature(s) Signed: 03/23/2015 5:16:59 PM By: [name], RN, [name], [name], [name] Entered By: [name], RN, [name], Kim on 03/23/2015 11:19:13 PRAISE, STENNETT ([id number] Lango, [name] ([id number]) -------------------------------------------------------------------------------- Multi Wound Chart Details Patient Name: [name], [name]. Date of Service: 03/23/2015 11:30 AM Medical Record Number: [id number] Patient Account Number: [id number] Date of Birth/Sex: Nov 05, 1946 (69 y.o. Female) Treating RN: Cornell Barman Primary Care Physician: [name] Other Clinician: Referring Physician:  [name] Treating Physician/Extender: [name] in Treatment: 2 Vital Signs Height(in): 63 Pulse(bpm): 63 Weight(lbs): 214.7 Blood Pressure 145/85 (mmHg): Body Mass Index(BMI): 38 Temperature(F): 98.3 Respiratory Rate 16 (breaths/min): Photos: [1:No Photos] [N/A:N/A] Wound Location: [1:Left Lower Leg - Lateral] [N/A:N/A] Wounding Event: [1:Gradually Appeared] [N/A:N/A] Primary Etiology: [1:Venous Leg Ulcer] [N/A:N/A] Comorbid History: [1:Cataracts, Asthma, Sleep Apnea, Deep Vein Thrombosis, Hypertension, Peripheral Venous Disease, Osteoarthritis, Received Chemotherapy, Received Radiation] [N/A:N/A] Date Acquired: [1:11/05/2014] [N/A:N/A] Weeks of Treatment: [1:2] [N/A:N/A] Wound Status: [1:Open] [N/A:N/A] Measurements L x W x D 0.6x0.4x0.1 [N/A:N/A] (cm) Area (cm) : [1:0.188] [N/A:N/A] Volume (cm) : [1:0.019] [  N/A:N/A] % Reduction in Area: [1:43.00%] [N/A:N/A] % Reduction in Volume: 42.40% [N/A:N/A] Classification: [1:Full Thickness Without Exposed Support Structures] [N/A:N/A] Exudate Amount: [1:Small] [N/A:N/A] Exudate Type: [1:Serous] [N/A:N/A] Exudate Color: [1:amber] [N/A:N/A] Wound Margin: [1:Indistinct, nonvisible] [N/A:N/A] Granulation Amount: [1:Large (67-100%)] [N/A:N/A] Granulation Quality: [1:Red] [N/A:N/A] Necrotic Amount: Small (1-33%) N/A N/A Exposed Structures: Fascia: No N/A N/A Fat: No Tendon: No Muscle: No Joint: No Bone: No Limited to Skin Breakdown Epithelialization: None N/A N/A Periwound Skin Texture: Edema: Yes N/A N/A Excoriation: No Induration: No Callus: No Crepitus: No Fluctuance: No Friable: No Rash: No Scarring: No Periwound Skin Dry/Scaly: Yes N/A N/A Moisture: Maceration: No Moist: No Periwound Skin Color: Erythema: Yes N/A N/A Atrophie Blanche: No Cyanosis: No Ecchymosis: No Hemosiderin Staining: No Mottled: No Pallor: No Rubor: No Temperature: No Abnormality N/A N/A Tenderness on Yes N/A  N/A Palpation: Wound Preparation: Ulcer Cleansing: N/A N/A Rinsed/Irrigated with Saline, Wound Cleanser Topical Anesthetic Applied: Other: Lidocaine 4% Ointment Treatment Notes Electronic Signature(s) Signed: 03/23/2015 5:16:59 PM By: [name], RN, [name], [name], [name] Entered By: [name], RN, [name], Kim on 03/23/2015 11:26:57 [name] ([id number]) -------------------------------------------------------------------------------- Multi-Disciplinary Care Plan Details Patient Name: [name], [name]. Date of Service: 03/23/2015 11:30 AM Medical Record Number: [id number] Patient Account Number: [id number] Date of Birth/Sex: 13-Jun-1946 (69 y.o. Female) Treating RN: Cornell Barman Primary Care Physician: [name] Other Clinician: Referring Physician: [name] Treating Physician/Extender: [name] in Treatment: 2 Active Inactive Abuse / Safety / Falls / Self Care Management Nursing Diagnoses: Potential for falls Goals: Patient will remain injury free Date Initiated: 03/09/2015 Goal Status: Active Patient/caregiver will verbalize understanding of skin care regimen Date Initiated: 03/09/2015 Goal Status: Active Patient/caregiver will verbalize/demonstrate measures taken to prevent injury and/or falls Date Initiated: 03/09/2015 Goal Status: Active Patient/caregiver will verbalize/demonstrate understanding of what to do in case of emergency Date Initiated: 03/09/2015 Goal Status: Active Interventions: Assess fall risk on admission and as needed Assess: immobility, friction, shearing, incontinence upon admission and as needed Assess impairment of mobility on admission and as needed per policy Provide education on fall prevention Provide education on safe transfers Notes: Nutrition Nursing Diagnoses: Potential for alteratiion in Nutrition/Potential for imbalanced nutrition Goals: Patient/caregiver agrees to and verbalizes understanding of need to use nutritional supplements  and/or vitamins as prescribed [name], [name] ([id number]) Date Initiated: 03/09/2015 Goal Status: Active Interventions: Assess patient nutrition upon admission and as needed per policy Provide education on nutrition Treatment Activities: Education provided on Nutrition : 03/09/2015 Notes: Orientation to the Wound Care Program Nursing Diagnoses: Knowledge deficit related to the wound healing center program Goals: Patient/caregiver will verbalize understanding of the [facility] Date Initiated: 03/09/2015 Goal Status: Active Interventions: Provide education on orientation to the wound center Notes: Venous Leg Ulcer Nursing Diagnoses: Actual venous Insuffiency (use after diagnosis is confirmed) Knowledge deficit related to disease process and management Goals: Non-invasive venous studies are completed as ordered Date Initiated: 03/09/2015 Goal Status: Active Patient will maintain optimal edema control Date Initiated: 03/09/2015 Goal Status: Active Patient/caregiver will verbalize understanding of disease process and disease management Date Initiated: 03/09/2015 Goal Status: Active Verify adequate tissue perfusion prior to therapeutic compression application Date Initiated: 03/09/2015 Goal Status: Active [name], [name] ([id number]) Interventions: Assess peripheral edema status every visit. Compression as ordered Provide education on venous insufficiency Treatment Activities: Non-invasive vascular studies : 03/23/2015 Therapeutic compression applied : 03/23/2015 Notes: Wound/Skin Impairment Nursing Diagnoses: Impaired tissue integrity Knowledge deficit related to ulceration/compromised skin integrity Goals: Patient/caregiver will verbalize understanding of skin care  regimen Date Initiated: 03/09/2015 Goal Status: Active Ulcer/skin breakdown will heal within 14 weeks Date Initiated: 03/09/2015 Goal Status: Active Interventions: Assess patient/caregiver ability to  obtain necessary supplies Assess patient/caregiver ability to perform ulcer/skin care regimen upon admission and as needed Assess ulceration(s) every visit Provide education on ulcer and skin care Treatment Activities: Skin care regimen initiated : 03/23/2015 Topical wound management initiated : 03/23/2015 Notes: Electronic Signature(s) Signed: 03/23/2015 5:16:59 PM By: [name], RN, [name], [name], [name] Entered By: [name], RN, [name], Kim on 03/23/2015 11:26:29 MARDI, CANNADY ([id number]) -------------------------------------------------------------------------------- Pain Assessment Details Patient Name: [name], [name]. Date of Service: 03/23/2015 11:30 AM Medical Record Number: [id number] Patient Account Number: [id number] Date of Birth/Sex: 26-Sep-1946 (69 y.o. Female) Treating RN: Cornell Barman Primary Care Physician: [name] Other Clinician: Referring Physician: [name] Treating Physician/Extender: [name] in Treatment: 2 Active Problems Location of Pain Severity and Description of Pain Patient Has Paino No Site Locations Pain Management and Medication Current Pain Management: Electronic Signature(s) Signed: 03/23/2015 5:16:59 PM By: [name], RN, [name], [name], [name] Entered By: [name], RN, [name], Kim on 03/23/2015 11:11:54 [name] ([id number]) -------------------------------------------------------------------------------- Wound Assessment Details Patient Name: [name], [name]. Date of Service: 03/23/2015 11:30 AM Medical Record Number: [id number] Patient Account Number: [id number] Date of Birth/Sex: 10-08-1946 (69 y.o. Female) Treating RN: Cornell Barman Primary Care Physician: [name] Other Clinician: Referring Physician: [name] Treating Physician/Extender: [name] in Treatment: 2 Wound Status Wound Number: 1 Primary Venous Leg Ulcer Etiology: Wound Location: Left, Lateral Lower Leg Wound Open Wounding Event: Gradually Appeared Status: Date  Acquired: 11/05/2014 Comorbid Cataracts, Asthma, Sleep Apnea, Deep Weeks Of Treatment: 2 History: Vein Thrombosis, Hypertension, Clustered Wound: No Peripheral Venous Disease, Osteoarthritis, Received Chemotherapy, Received Radiation Photos Photo Uploaded By: [name], RN, [name], Kim on 03/23/2015 17:16:03 Wound Measurements Length: (cm) 0.3 Width: (cm) 0.3 Depth: (cm) 0.1 Area: (cm) 0.071 Volume: (cm) 0.007 % Reduction in Area: 78.5% % Reduction in Volume: 78.8% Epithelialization: None Tunneling: No Undermining: No Wound Description Full Thickness Without Exposed Classification: Support Structures Wound Margin: Indistinct, nonvisible Exudate Small Amount: Exudate Type: Serous Exudate Color: amber Foul Odor After Cleansing: No Wound Bed [name], [name]. ([id number]) Granulation Amount: Large (67-100%) Exposed Structure Granulation Quality: Red Fascia Exposed: No Necrotic Amount: Small (1-33%) Fat Layer Exposed: No Necrotic Quality: Adherent Slough Tendon Exposed: No Muscle Exposed: No Joint Exposed: No Bone Exposed: No Limited to Skin Breakdown Periwound Skin Texture Texture Color No Abnormalities Noted: No No Abnormalities Noted: No Callus: No Atrophie Blanche: No Crepitus: No Cyanosis: No Excoriation: No Ecchymosis: No Fluctuance: No Erythema: Yes Friable: No Hemosiderin Staining: No Induration: No Mottled: No Localized Edema: Yes Pallor: No Rash: No Rubor: No Scarring: No Temperature / Pain Moisture Temperature: No Abnormality No Abnormalities Noted: No Tenderness on Palpation: Yes Dry / Scaly: Yes Maceration: No Moist: No Wound Preparation Ulcer Cleansing: Rinsed/Irrigated with Saline, Wound Cleanser Topical Anesthetic Applied: Other: Lidocaine 4% Ointment , Electronic Signature(s) Signed: 03/23/2015 5:16:59 PM By: [name], RN, [name], [name], [name] Entered By: [name], RN, [name], Kim on 03/23/2015 11:29:34 [name], [name]  ([id number]) -------------------------------------------------------------------------------- Vitals Details Patient Name: [name], [name]. Date of Service: 03/23/2015 11:30 AM Medical Record Number: [id number] Patient Account Number: [id number] Date of Birth/Sex: Mar 25, 1946 (69 y.o. Female) Treating RN: Cornell Barman Primary Care Physician: [name] Other Clinician: Referring Physician: [name] Treating Physician/Extender: [name] in Treatment: 2 Vital Signs Time Taken: 11:10 Temperature (F): 98.3 Height (in): 63 Pulse (bpm): 63 Weight (lbs): 214.7 Respiratory Rate (  breaths/min): 16 Body Mass Index (BMI): 38 Blood Pressure (mmHg): 145/85 Reference Range: 80 - 120 mg / dl Electronic Signature(s) Signed: 03/23/2015 5:16:59 PM By: [name], RN, [name], [name], [name] Entered By: [name], RN, [name], Kim on 03/23/2015 11:12:15

## 2015-03-30 ENCOUNTER — Encounter: Payer: Medicare Other | Admitting: Surgery

## 2015-03-30 DIAGNOSIS — Z87891 Personal history of nicotine dependence: Secondary | ICD-10-CM | POA: Diagnosis not present

## 2015-03-30 DIAGNOSIS — I872 Venous insufficiency (chronic) (peripheral): Secondary | ICD-10-CM | POA: Diagnosis not present

## 2015-03-30 DIAGNOSIS — I83222 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 of calf and inflammation: Secondary | ICD-10-CM | POA: Diagnosis not present

## 2015-03-30 DIAGNOSIS — R600 Localized edema: Secondary | ICD-10-CM | POA: Diagnosis not present

## 2015-03-30 DIAGNOSIS — Z85820 Personal history of malignant melanoma of skin: Secondary | ICD-10-CM | POA: Diagnosis not present

## 2015-03-30 DIAGNOSIS — L97229 Non-pressure chronic ulcer of left calf with unspecified severity: Secondary | ICD-10-CM | POA: Diagnosis not present

## 2015-03-31 NOTE — Progress Notes (Signed)
Amber, [name] ([id number]) Visit Report for 03/30/2015 Arrival Information Details Patient Name: [name], [name]. Date of Service: 03/30/2015 11:30 AM Medical Record Number: [id number] Patient Account Number: [id number] Date of Birth/Sex: November 01, 1946 (69 y.o. Female) Treating RN: [name] Primary Care Physician: [name] Other Clinician: Referring Physician: [name] Treating Physician/Extender: BURNS III, Charlean Sanfilippo in Treatment: 3 Visit Information History Since Last Visit Added or deleted any medications: No Patient Arrived: Ambulatory Any new allergies or adverse reactions: No Arrival Time: 11:21 Had a fall or experienced change in No Accompanied By: self activities of daily living that may affect Transfer Assistance: None risk of falls: Patient Identification Verified: Yes Signs or symptoms of abuse/neglect since last No Secondary Verification Process Yes visito Completed: Hospitalized since last visit: No Patient Has Alerts: Yes Has Dressing in Place as Prescribed: Yes Patient Alerts: NO BPs (R) Has Compression in Place as Prescribed: Yes arm Pain Present Now: Yes Electronic Signature(s) Signed: 03/30/2015 4:44:10 PM By: [name] RN Entered By: [name] on 03/30/2015 11:23:28 Hino, [name] ([id number]) -------------------------------------------------------------------------------- Encounter Discharge Information Details Patient Name: Amber, [name]. Date of Service: 03/30/2015 11:30 AM Medical Record Number: [id number] Patient Account Number: [id number] Date of Birth/Sex: [birth year] (69 y.o. Female) Treating RN: Primary Care Physician: [name] Other Clinician: Referring Physician: [name] Treating Physician/Extender: BURNS III, Charlean Sanfilippo in Treatment: 3 Encounter Discharge Information Items Schedule Follow-up Appointment: No Medication Reconciliation completed No and provided to Patient/Care  Jury Caserta: Provided on Clinical Summary of Care: 03/30/2015 Form Type Recipient Paper Patient LP Electronic Signature(s) Signed: 03/30/2015 11:48:18 AM By: Ruthine Dose Entered By: Ruthine Dose on 03/30/2015 11:48:18 [name], [name] ([id number]) -------------------------------------------------------------------------------- Lower Extremity Assessment Details Patient Name: [name], Amber. Date of Service: 03/30/2015 11:30 AM Medical Record Number: [id number] Patient Account Number: [id number] Date of Birth/Sex: [birth year] (69 y.o. Female) Treating RN: [name] Primary Care Physician: [name] Other Clinician: Referring Physician: [name] Treating Physician/Extender: BURNS III, Charlean Sanfilippo in Treatment: 3 Edema Assessment Assessed: [Left: Yes] [Right: No] Edema: [Left: Ye] [Right: s] Calf Left: Right: Point of Measurement: 28 cm From Medial Instep 37.8 cm cm Ankle Left: Right: Point of Measurement: 10 cm From Medial Instep 23.5 cm cm Vascular Assessment Claudication: Claudication Assessment [Left:None] Pulses: Posterior Tibial Palpable: [Left:Yes] Dorsalis Pedis Palpable: [Left:Yes] Extremity colors, hair growth, and conditions: Extremity Color: [Left:Normal] Hair Growth on Extremity: [Left:No] Temperature of Extremity: [Left:Warm] Capillary Refill: [Left:< 3 seconds] Dependent Rubor: [Left:No] Blanched when Elevated: [Left:No] Toe Nail Assessment Left: Right: Thick: Yes Discolored: Yes Deformed: Yes Improper Length and Hygiene: Yes SEIRRA, KOS ([id number]) Electronic Signature(s) Signed: 03/30/2015 4:44:10 PM By: [name] RN Entered By: [name] on 03/30/2015 11:25:20 Claros, [name] ([id number]) -------------------------------------------------------------------------------- Multi Wound Chart Details Patient Name: Amber, [name]. Date of Service: 03/30/2015 11:30 AM Medical Record Number: [id number] Patient Account  Number: [id number] Date of Birth/Sex: Mar 07, 1946 (69 y.o. Female) Treating RN: [name], RN, [name], Velva Harman Primary Care Physician: [name] Other Clinician: Referring Physician: [name] Treating Physician/Extender: BURNS III, WALTER Weeks in Treatment: 3 Vital Signs Height(in): 63 Pulse(bpm): 59 Weight(lbs): 214.7 Blood Pressure 133/77 (mmHg): Body Mass Index(BMI): 38 Temperature(F): 97.7 Respiratory Rate 12 (breaths/min): Photos: [1:No Photos] [N/A:N/A] Wound Location: [1:Left Lower Leg - Lateral] [N/A:N/A] Wounding Event: [1:Gradually Appeared] [N/A:N/A] Primary Etiology: [1:Venous Leg Ulcer] [N/A:N/A] Comorbid History: [1:Cataracts, Asthma, Sleep Apnea, Deep Vein Thrombosis, Hypertension, Peripheral Venous Disease, Osteoarthritis, Received Chemotherapy, Received Radiation] [N/A:N/A] Date Acquired: [1:11/05/2014] [N/A:N/A] Weeks of Treatment: [1:3] [N/A:N/A] Wound Status: [  1:Open] [N/A:N/A] Measurements L x W x D 0.7x0.7x0.1 [N/A:N/A] (cm) Area (cm) : [1:0.385] [N/A:N/A] Volume (cm) : [1:0.038] [N/A:N/A] % Reduction in Area: [1:-16.70%] [N/A:N/A] % Reduction in Volume: -15.20% [N/A:N/A] Classification: [1:Full Thickness Without Exposed Support Structures] [N/A:N/A] Exudate Amount: [1:Small] [N/A:N/A] Exudate Type: [1:Serous] [N/A:N/A] Exudate Color: [1:amber] [N/A:N/A] Wound Margin: [1:Indistinct, nonvisible] [N/A:N/A] Granulation Amount: [1:None Present (0%)] [N/A:N/A] Necrotic Amount: [1:Large (67-100%)] [N/A:N/A] Necrotic Tissue: Eschar N/A N/A Exposed Structures: Fascia: No N/A N/A Fat: No Tendon: No Muscle: No Joint: No Bone: No Limited to Skin Breakdown Epithelialization: None N/A N/A Debridement: Open Wound/Selective N/A N/A ([id number]) - Selective Time-Out Taken: Yes N/A N/A Pain Control: Lidocaine 4% Topical N/A N/A Solution Tissue Debrided: Fibrin/Slough, Other, N/A N/A Subcutaneous Level: Non-Viable Tissue N/A N/A Debridement Area  (sq 0.49 N/A N/A cm): Instrument: Curette N/A N/A Bleeding: None N/A N/A Procedural Pain: 3 N/A N/A Post Procedural Pain: 4 N/A N/A Debridement Treatment Procedure was tolerated N/A N/A Response: well Post Debridement 0.7x0.7x0.1 N/A N/A Measurements L x W x D (cm) Post Debridement 0.038 N/A N/A Volume: (cm) Periwound Skin Texture: Edema: No N/A N/A Excoriation: No Induration: No Callus: No Crepitus: No Fluctuance: No Friable: No Rash: No Scarring: No Periwound Skin Dry/Scaly: Yes N/A N/A Moisture: Maceration: No Moist: No Periwound Skin Color: Atrophie Blanche: No N/A N/A Cyanosis: No Ecchymosis: No Erythema: No Hemosiderin Staining: No Mottled: No CLORENE, NERIO. ([id number]) Pallor: No Rubor: No Temperature: No Abnormality N/A N/A Tenderness on Yes N/A N/A Palpation: Wound Preparation: Ulcer Cleansing: N/A N/A Rinsed/Irrigated with Saline, Wound Cleanser Topical Anesthetic Applied: Other: Lidocaine 4% Ointment Procedures Performed: Debridement N/A N/A Treatment Notes Electronic Signature(s) Signed: 03/30/2015 5:38:29 PM By: Regan Lemming [name], RN Entered By: Regan Lemming on 03/30/2015 11:43:39 Leaver, [name] ([id number]) -------------------------------------------------------------------------------- [place] Details Patient Name: SHEA, SWALLEY. Date of Service: 03/30/2015 11:30 AM Medical Record Number: [id number] Patient Account Number: [id number] Date of Birth/Sex: 1946/05/10 (69 y.o. Female) Treating RN: Afful, RN, [name], Velva Harman Primary Care Physician: [name] Other Clinician: Referring Physician: [name] Treating Physician/Extender: BURNS III, Charlean Sanfilippo in Treatment: 3 Active Inactive Abuse / Safety / Falls / Self Care Management Nursing Diagnoses: Potential for falls Goals: Patient will remain injury free Date Initiated: 03/09/2015 Goal Status: Active Patient/caregiver will verbalize understanding of skin  care regimen Date Initiated: 03/09/2015 Goal Status: Active Patient/caregiver will verbalize/demonstrate measures taken to prevent injury and/or falls Date Initiated: 03/09/2015 Goal Status: Active Patient/caregiver will verbalize/demonstrate understanding of what to do in case of emergency Date Initiated: 03/09/2015 Goal Status: Active Interventions: Assess fall risk on admission and as needed Assess: immobility, friction, shearing, incontinence upon admission and as needed Assess impairment of mobility on admission and as needed per policy Provide education on fall prevention Provide education on safe transfers Notes: Nutrition Nursing Diagnoses: Potential for alteratiion in Nutrition/Potential for imbalanced nutrition Goals: Patient/caregiver agrees to and verbalizes understanding of need to use nutritional supplements and/or vitamins as prescribed NAELLE, DIEGEL ([id number]) Date Initiated: 03/09/2015 Goal Status: Active Interventions: Assess patient nutrition upon admission and as needed per policy Provide education on nutrition Treatment Activities: Education provided on Nutrition : 03/09/2015 Notes: Orientation to the Wound Care Program Nursing Diagnoses: Knowledge deficit related to the wound healing center program Goals: Patient/caregiver will verbalize understanding of the [facility] Date Initiated: 03/09/2015 Goal Status: Active Interventions: Provide education on orientation to the wound center Notes: Venous Leg Ulcer Nursing Diagnoses: Actual venous Insuffiency (use after diagnosis is confirmed) Knowledge  deficit related to disease process and management Goals: Non-invasive venous studies are completed as ordered Date Initiated: 03/09/2015 Goal Status: Active Patient will maintain optimal edema control Date Initiated: 03/09/2015 Goal Status: Active Patient/caregiver will verbalize understanding of disease process and disease management Date  Initiated: 03/09/2015 Goal Status: Active Verify adequate tissue perfusion prior to therapeutic compression application Date Initiated: 03/09/2015 Goal Status: Active SONIYAH, MCGLORY ([id number]) Interventions: Assess peripheral edema status every visit. Compression as ordered Provide education on venous insufficiency Treatment Activities: Non-invasive vascular studies : 03/30/2015 Therapeutic compression applied : 03/30/2015 Notes: Wound/Skin Impairment Nursing Diagnoses: Impaired tissue integrity Knowledge deficit related to ulceration/compromised skin integrity Goals: Patient/caregiver will verbalize understanding of skin care regimen Date Initiated: 03/09/2015 Goal Status: Active Ulcer/skin breakdown will heal within 14 weeks Date Initiated: 03/09/2015 Goal Status: Active Interventions: Assess patient/caregiver ability to obtain necessary supplies Assess patient/caregiver ability to perform ulcer/skin care regimen upon admission and as needed Assess ulceration(s) every visit Provide education on ulcer and skin care Treatment Activities: Skin care regimen initiated : 03/30/2015 Topical wound management initiated : 03/30/2015 Notes: Electronic Signature(s) Signed: 03/30/2015 5:38:29 PM By: Regan Lemming [name], RN Entered By: Regan Lemming on 03/30/2015 11:43:26 Chaddock, [name] ([id number]) -------------------------------------------------------------------------------- Pain Assessment Details Patient Name: NERIAH, BROTT. Date of Service: 03/30/2015 11:30 AM Medical Record Number: [id number] Patient Account Number: [id number] Date of Birth/Sex: 1946-05-18 (69 y.o. Female) Treating RN: [name] Primary Care Physician: [name] Other Clinician: Referring Physician: [name] Treating Physician/Extender: BURNS III, WALTER Weeks in Treatment: 3 Active Problems Location of Pain Severity and Description of Pain Patient Has Paino Yes Site Locations With Dressing  Change: Yes Duration of the Pain. Constant / Intermittento Constant Rate the pain. Current Pain Level: 2 Worst Pain Level: 8 Least Pain Level: 0 Character of Pain Describe the Pain: Burning Pain Management and Medication Current Pain Management: Electronic Signature(s) Signed: 03/30/2015 4:44:10 PM By: [name] RN Entered By: [name] on 03/30/2015 11:24:06 Purves, [name] ([id number]) -------------------------------------------------------------------------------- Patient/Caregiver Education Details Patient Name: LADAJAH, SOLTYS. Date of Service: 03/30/2015 11:30 AM Medical Record Number: [id number] Patient Account Number: [id number] Date of Birth/Gender: 1946-04-01 (68 y.o. Female) Treating RN: [name] Primary Care Physician: [name] Other Clinician: Referring Physician: [name] Treating Physician/Extender: BURNS III, Charlean Sanfilippo in Treatment: 3 Education Assessment Education Provided To: Patient Education Topics Provided Safety: Methods: Explain/Verbal Responses: State content correctly Venous: Methods: Explain/Verbal Responses: State content correctly Wound Debridement: Methods: Explain/Verbal Responses: State content correctly Wound/Skin Impairment: Methods: Explain/Verbal Responses: State content correctly Electronic Signature(s) Signed: 03/30/2015 4:44:10 PM By: [name] RN Entered By: [name] on 03/30/2015 11:58:22 Hoelzer, Kennedi Lenna Sciara ([id number]) -------------------------------------------------------------------------------- Wound Assessment Details Patient Name: SANTRICE, MUZIO. Date of Service: 03/30/2015 11:30 AM Medical Record Number: [id number] Patient Account Number: [id number] Date of Birth/Sex: 04-05-1946 (69 y.o. Female) Treating RN: [name] Primary Care Physician: [name] Other Clinician: Referring Physician: [name] Treating Physician/Extender: BURNS III, WALTER Weeks in  Treatment: 3 Wound Status Wound Number: 1 Primary Venous Leg Ulcer Etiology: Wound Location: Left Lower Leg - Lateral Wound Open Wounding Event: Gradually Appeared Status: Date Acquired: 11/05/2014 Comorbid Cataracts, Asthma, Sleep Apnea, Deep Weeks Of Treatment: 3 History: Vein Thrombosis, Hypertension, Clustered Wound: No Peripheral Venous Disease, Osteoarthritis, Received Chemotherapy, Received Radiation Photos Photo Uploaded By: [name] on 03/30/2015 16:41:37 Wound Measurements Length: (cm) 0.7 Width: (cm) 0.7 Depth: (cm) 0.1 Area: (cm) 0.385 Volume: (cm) 0.038 % Reduction in Area: -16.7% % Reduction in Volume: -15.2% Epithelialization: None  Tunneling: No Undermining: No Wound Description Full Thickness Without Exposed Classification: Support Structures Wound Margin: Indistinct, nonvisible Exudate Small Amount: Exudate Type: Serous Exudate Color: amber Foul Odor After Cleansing: No Wound Bed NIANG, MITCHELTREE. ([id number]) Granulation Amount: None Present (0%) Exposed Structure Necrotic Amount: Large (67-100%) Fascia Exposed: No Necrotic Quality: Eschar Fat Layer Exposed: No Tendon Exposed: No Muscle Exposed: No Joint Exposed: No Bone Exposed: No Limited to Skin Breakdown Periwound Skin Texture Texture Color No Abnormalities Noted: No No Abnormalities Noted: No Callus: No Atrophie Blanche: No Crepitus: No Cyanosis: No Excoriation: No Ecchymosis: No Fluctuance: No Erythema: No Friable: No Hemosiderin Staining: No Induration: No Mottled: No Localized Edema: No Pallor: No Rash: No Rubor: No Scarring: No Temperature / Pain Moisture Temperature: No Abnormality No Abnormalities Noted: No Tenderness on Palpation: Yes Dry / Scaly: Yes Maceration: No Moist: No Wound Preparation Ulcer Cleansing: Rinsed/Irrigated with Saline, Wound Cleanser Topical Anesthetic Applied: Other: Lidocaine 4% Ointment , Treatment Notes Wound #1 (Left,  Lateral Lower Leg) 1. Cleansed with: Clean wound with Normal Saline 2. Anesthetic Topical Lidocaine 4% cream to wound bed prior to debridement 4. Dressing Applied: Iodoflex 5. Secondary Dressing Applied Gauze and Kerlix/Conform 7. Secured with 2 Layer Lite Compression System - Left Lower Extremity Electronic Signature(s) Signed: 03/30/2015 4:44:10 PM By: [name] RN Naves, [place] ([id number]) Entered By: [name] on 03/30/2015 11:28:02 ATIYAH, BAUER ([id number]) -------------------------------------------------------------------------------- Vitals Details Patient Name: MALICIA, BLASDEL. Date of Service: 03/30/2015 11:30 AM Medical Record Number: [id number] Patient Account Number: [id number] Date of Birth/Sex: 1946-05-21 (70 y.o. Female) Treating RN: [name] Primary Care Physician: [name] Other Clinician: Referring Physician: [name] Treating Physician/Extender: BURNS III, WALTER Weeks in Treatment: 3 Vital Signs Time Taken: 11:20 Temperature (F): 97.7 Height (in): 63 Pulse (bpm): 59 Weight (lbs): 214.7 Respiratory Rate (breaths/min): 12 Body Mass Index (BMI): 38 Blood Pressure (mmHg): 133/77 Reference Range: 80 - 120 mg / dl Electronic Signature(s) Signed: 03/30/2015 4:44:10 PM By: [name] RN Entered By: [name] on 03/30/2015 11:23:51

## 2015-03-31 NOTE — Progress Notes (Signed)
BERT, GIVANS ([id number]) Visit Report for 03/30/2015 Chief Complaint Document Details Patient Name: [name], [name]. Date of Service: 03/30/2015 11:30 AM Medical Record Number: [id number] Patient Account Number: [id number] Date of Birth/Sex: [birth year] (69 y.o. Female) Treating RN: Primary Care Physician: [name] Other Clinician: Referring Physician: [name] Treating Physician/Extender: BURNS III, WALTER Weeks in Treatment: 3 Information Obtained from: Patient Chief Complaint Left calf venous stasis ulceration. Electronic Signature(s) Signed: 03/30/2015 4:38:40 PM By: [name] MD Entered By: [name] on 03/30/2015 12:12:07 ELEONOR, OCON ([id number]) -------------------------------------------------------------------------------- Debridement Details Patient Name: [name], [name]. Date of Service: 03/30/2015 11:30 AM Medical Record Number: [id number] Patient Account Number: [id number] Date of Birth/Sex: [birth year] (69 y.o. Female) Treating RN: Primary Care Physician: [name] Other Clinician: Referring Physician: [name] Treating Physician/Extender: BURNS III, WALTER Weeks in Treatment: 3 Debridement Performed for Wound #1 Left,Lateral Lower Leg Assessment: Performed By: Physician BURNS III, WALTER, Debridement: Open Wound/Selective Debridement Selective Description: Pre-procedure Yes Verification/Time Out Taken: Start Time: 11:40 Pain Control: Lidocaine 4% Topical Solution Level: Non-Viable Tissue Total Area Debrided (L x 0.7 (cm) x 0.7 (cm) = 0.49 (cm) W): Tissue and other Non-Viable, Eschar, Fibrin/Slough, Other, Subcutaneous material debrided: Instrument: Curette Bleeding: None End Time: 11:40 Procedural Pain: 3 Post Procedural Pain: 4 Response to Treatment: Procedure was tolerated well Post Debridement Measurements of Total Wound Length: (cm) 0.7 Width: (cm) 0.7 Depth: (cm) 0.2 Volume: (cm)  0.077 Electronic Signature(s) Signed: 03/30/2015 4:38:40 PM By: [name] MD Entered By: [name] on 03/30/2015 12:12:00 [name] ([id number]) -------------------------------------------------------------------------------- HPI Details Patient Name: [name], [name]. Date of Service: 03/30/2015 11:30 AM Medical Record Number: [id number] Patient Account Number: [id number] Date of Birth/Sex: September 09, 1946 (69 y.o. Female) Treating RN: Primary Care Physician: [name] Other Clinician: Referring Physician: [name] Treating Physician/Extender: BURNS III, WALTER Weeks in Treatment: 3 History of Present Illness HPI Description: Pleasant 69 year old with history of chronic venous insufficiency. No diabetes or peripheral vascular disease. Left ABI 1.29. Questionable history of left lower extremity DVT. She developed a recurrent ulceration on her left lateral calf in December 2015, which she attributes to poor diet and subsequent lower extremity edema. She underwent endovenous laser ablation of her left greater saphenous vein in 2010. She underwent laser ablation of accessory branch of left GSV in April 2016 by Dr. Kellie Simmering at [facility]. She was previously wearing Unna boots, which she tolerated well. Tolerating 2 layer compression and cadexomer iodine. She returns to clinic for follow-up and is without new complaints. She denies any significant pain at this time. She reports persistent pain with pressure. No claudication or ischemic rest pain. No fever or chills. No significant drainage. Electronic Signature(s) Signed: 03/30/2015 4:38:40 PM By: [name] MD Entered By: [name] on 03/30/2015 12:12:19 DELITA, CHIQUITO ([id number]) -------------------------------------------------------------------------------- Physical Exam Details Patient Name: [name], [name]. Date of Service: 03/30/2015 11:30 AM Medical Record Number: [id number] Patient  Account Number: [id number] Date of Birth/Sex: 08-24-1946 (69 y.o. Female) Treating RN: Primary Care Physician: [name] Other Clinician: Referring Physician: [name] Treating Physician/Extender: BURNS III, WALTER Weeks in Treatment: 3 Constitutional . Pulse regular. Respirations normal and unlabored. Afebrile. . Notes Left lateral calf ulceration improved with compression. Full-thickness. No exposed deep structures. No evidence for infection. No features to suggest malignancy/melanoma. 1+ pitting edema. No palpable pedal pulses. Dopplerable DP and PT, both monophasic. ABIs abnormally elevated at 1.29. Electronic Signature(s) Signed: 03/30/2015 4:38:40 PM By: [name] MD Entered  By: [name] on 03/30/2015 12:12:43 Bunnell, [name] ([id number]) -------------------------------------------------------------------------------- Physician Orders Details Patient Name: [name], [name]. Date of Service: 03/30/2015 11:30 AM Medical Record Number: [id number] Patient Account Number: [id number] Date of Birth/Sex: [birth year] (69 y.o. Female) Treating RN: [name], RN, [name], Velva Harman Primary Care Physician: [name] Other Clinician: Referring Physician: [name] Treating Physician/Extender: BURNS III, Charlean Sanfilippo in Treatment: 3 Verbal / Phone Orders: Yes Clinician: Afful, RN, [name], Rita Read Back and Verified: Yes Diagnosis Coding Wound Cleansing Wound #1 Left,Lateral Lower Leg o Clean wound with Normal Saline. Anesthetic Wound #1 Left,Lateral Lower Leg o Topical Lidocaine 4% cream applied to wound bed prior to debridement Primary Wound Dressing Wound #1 Left,Lateral Lower Leg o Iodoflex Secondary Dressing Wound #1 Left,Lateral Lower Leg o Dry Gauze Dressing Change Frequency Wound #1 Left,Lateral Lower Leg o Change dressing every week Follow-up Appointments Wound #1 Left,Lateral Lower Leg o Return Appointment in 1 week. Edema  Control Wound #1 Left,Lateral Lower Leg o 2 Layer Lite Compression System - Left Lower Extremity o Elevate legs to the level of the heart and pump ankles as often as possible Electronic Signature(s) Signed: 03/30/2015 5:38:29 PM By: Regan Lemming [name], RN Entered By: Regan Lemming on 03/30/2015 11:44:03 [name], [name] ([id number]) Reveles, [name] ([id number]) -------------------------------------------------------------------------------- Problem List Details Patient Name: [name], [name]. Date of Service: 03/30/2015 11:30 AM Medical Record Number: [id number] Patient Account Number: [id number] Date of Birth/Sex: 10/16/1946 (69 y.o. Female) Treating RN: Primary Care Physician: [name] Other Clinician: Referring Physician: [name] Treating Physician/Extender: BURNS III, Charlean Sanfilippo in Treatment: 3 Active Problems ICD-10 Encounter Code Description Active Date Diagnosis I83.222 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 of 03/09/2015 Yes calf and inflammation I87.2 Venous insufficiency (chronic) (peripheral) 03/09/2015 Yes Z85.820 Personal history of malignant melanoma of skin 03/09/2015 Yes Inactive Problems Resolved Problems Electronic Signature(s) Signed: 03/30/2015 4:38:40 PM By: [name] MD Entered By: [name] on 03/30/2015 12:11:31 Giannetti, [name] ([id number]) -------------------------------------------------------------------------------- Progress Note Details Patient Name: [name], [name]. Date of Service: 03/30/2015 11:30 AM Medical Record Number: [id number] Patient Account Number: [id number] Date of Birth/Sex: [birth year] (69 y.o. Female) Treating RN: Primary Care Physician: [name] Other Clinician: Referring Physician: [name] Treating Physician/Extender: BURNS III, WALTER Weeks in Treatment: 3 Subjective Chief Complaint Information obtained from Patient Left calf venous stasis ulceration. History of  Present Illness (HPI) Pleasant 69 year old with history of chronic venous insufficiency. No diabetes or peripheral vascular disease. Left ABI 1.29. Questionable history of left lower extremity DVT. She developed a recurrent ulceration on her left lateral calf in December 2015, which she attributes to poor diet and subsequent lower extremity edema. She underwent endovenous laser ablation of her left greater saphenous vein in 2010. She underwent laser ablation of accessory branch of left GSV in April 2016 by Dr. Kellie Simmering at [facility]. She was previously wearing Unna boots, which she tolerated well. Tolerating 2 layer compression and cadexomer iodine. She returns to clinic for follow-up and is without new complaints. She denies any significant pain at this time. She reports persistent pain with pressure. No claudication or ischemic rest pain. No fever or chills. No significant drainage. Objective Constitutional Pulse regular. Respirations normal and unlabored. Afebrile. Vitals Time Taken: 11:20 AM, Height: 63 in, Weight: 214.7 lbs, BMI: 38, Temperature: 97.7 F, Pulse: 59 bpm, Respiratory Rate: 12 breaths/min, Blood Pressure: 133/77 mmHg. General Notes: Left lateral calf ulceration improved with compression. Full-thickness. No exposed deep structures. No evidence for  infection. No features to suggest malignancy/melanoma. 1+ pitting edema. No palpable pedal pulses. Dopplerable DP and PT, both monophasic. ABIs abnormally elevated at 1.29. Integumentary (Hair, Skin) Wound #1 status is Open. Original cause of wound was Gradually Appeared. The wound is located on the Left,Lateral Lower Leg. The wound measures 0.7cm length x 0.7cm width x 0.1cm depth; 0.385cm^2 area [name], [name]. ([id number]) and 0.038cm^3 volume. The wound is limited to skin breakdown. There is no tunneling or undermining noted. There is a small amount of serous drainage noted. The wound margin is indistinct and nonvisible. There  is no granulation within the wound bed. There is a large (67-100%) amount of necrotic tissue within the wound bed including Eschar. The periwound skin appearance exhibited: Dry/Scaly. The periwound skin appearance did not exhibit: Callus, Crepitus, Excoriation, Fluctuance, Friable, Induration, Localized Edema, Rash, Scarring, Maceration, Moist, Atrophie Blanche, Cyanosis, Ecchymosis, Hemosiderin Staining, Mottled, Pallor, Rubor, Erythema. Periwound temperature was noted as No Abnormality. The periwound has tenderness on palpation. Assessment Active Problems ICD-10 I83.222 - 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 of calf and inflammation I87.2 - Venous insufficiency (chronic) (peripheral) Z85.820 - Personal history of malignant melanoma of skin Left lateral calf venous stasis ulceration. Procedures Wound #1 Wound #1 is a Venous Leg Ulcer located on the Left,Lateral Lower Leg . There was a Non-Viable Tissue Open Wound/Selective [phone]) debridement with total area of 0.49 sq cm performed by BURNS III, WALTER. with the following instrument(s): Curette to remove Non-Viable tissue/material including Fibrin/Slough, Eschar, Other, and Subcutaneous after achieving pain control using Lidocaine 4% Topical Solution. A time out was conducted prior to the start of the procedure. There was no bleeding. The procedure was tolerated well with a pain level of 3 throughout and a pain level of 4 following the procedure. Post Debridement Measurements: 0.7cm length x 0.7cm width x 0.2cm depth; 0.077cm^3 volume. Plan Wound Cleansing: Wound #1 Left,Lateral Lower Leg: Clean wound with Normal Saline. [name], [name] ([id number]) Anesthetic: Wound #1 Left,Lateral Lower Leg: Topical Lidocaine 4% cream applied to wound bed prior to debridement Primary Wound Dressing: Wound #1 Left,Lateral Lower Leg: Iodoflex Secondary Dressing: Wound #1 Left,Lateral Lower Leg: Dry Gauze Dressing Change  Frequency: Wound #1 Left,Lateral Lower Leg: Change dressing every week Follow-up Appointments: Wound #1 Left,Lateral Lower Leg: Return Appointment in 1 week. Edema Control: Wound #1 Left,Lateral Lower Leg: 2 Layer Lite Compression System - Left Lower Extremity Elevate legs to the level of the heart and pump ankles as often as possible Cadexomer iodine and 2 layer compression. Electronic Signature(s) Signed: 03/30/2015 4:38:40 PM By: [name] MD Entered By: [name] on 03/30/2015 12:13:41 [name], [name] ([id number]) -------------------------------------------------------------------------------- SuperBill Details Patient Name: [name], [name]. Date of Service: 03/30/2015 Medical Record Number: [id number] Patient Account Number: [id number] Date of Birth/Sex: 05-Mar-1946 (68 y.o. Female) Treating RN: Primary Care Physician: [name] Other Clinician: Referring Physician: [name] Treating Physician/Extender: BURNS III, WALTER Weeks in Treatment: 3 Diagnosis Coding ICD-10 Codes Code Description L93.790 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 of calf and inflammation I87.2 Venous insufficiency (chronic) (peripheral) Z85.820 Personal history of malignant melanoma of skin Facility Procedures CPT4: Description Modifier Quantity Code [id number] - DEBRIDE WOUND 1ST 20 SQ CM OR < 1 ICD-10 Description Diagnosis I83.222 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 of calf and inflammation Physician Procedures CPT4: Description Modifier Quantity Code [id number] - WC PHYS DEBR WO ANESTH 20 SQ CM 1 ICD-10 Description Diagnosis I83.222  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 of calf and inflammation Electronic Signature(s) Signed: 03/30/2015 4:38:40 PM By: [name] MD Entered By: [name] on 03/30/2015 12:14:00

## 2015-04-06 ENCOUNTER — Telehealth: Payer: Self-pay | Admitting: *Deleted

## 2015-04-06 ENCOUNTER — Encounter: Payer: Medicare Other | Attending: Surgery | Admitting: Surgery

## 2015-04-06 ENCOUNTER — Telehealth: Payer: Self-pay | Admitting: Family Medicine

## 2015-04-06 DIAGNOSIS — J45909 Unspecified asthma, uncomplicated: Secondary | ICD-10-CM | POA: Insufficient documentation

## 2015-04-06 DIAGNOSIS — L97229 Non-pressure chronic ulcer of left calf with unspecified severity: Secondary | ICD-10-CM | POA: Insufficient documentation

## 2015-04-06 DIAGNOSIS — Z85820 Personal history of malignant melanoma of skin: Secondary | ICD-10-CM | POA: Diagnosis not present

## 2015-04-06 DIAGNOSIS — R600 Localized edema: Secondary | ICD-10-CM | POA: Insufficient documentation

## 2015-04-06 DIAGNOSIS — Z87891 Personal history of nicotine dependence: Secondary | ICD-10-CM | POA: Diagnosis not present

## 2015-04-06 DIAGNOSIS — I872 Venous insufficiency (chronic) (peripheral): Secondary | ICD-10-CM | POA: Diagnosis not present

## 2015-04-06 DIAGNOSIS — I83222 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 of calf and inflammation: Secondary | ICD-10-CM | POA: Diagnosis not present

## 2015-04-06 NOTE — Telephone Encounter (Signed)
Patient returned Kim's call. °

## 2015-04-06 NOTE — Telephone Encounter (Signed)
LMOM to return my call about screening mammo.

## 2015-04-07 NOTE — Telephone Encounter (Signed)
Spoke with patient and she said she has one done Sport and exercise psychologist at [facility]. Records requested to update chart.

## 2015-04-07 NOTE — Progress Notes (Signed)
TUYEN, UNCAPHER ([id number]) Visit Report for 04/06/2015 Chief Complaint Document Details Patient Name: [name], [name]. Date of Service: 04/06/2015 11:30 AM Medical Record Number: [id number] Patient Account Number: [id number] Date of Birth/Sex: [birth year] (69 y.o. Female) Treating RN: Primary Care Physician: [name] Other Clinician: Referring Physician: [name] Treating Physician/Extender: BURNS, Charlean Sanfilippo in Treatment: 4 Information Obtained from: Patient Chief Complaint Left calf venous stasis ulceration. Electronic Signature(s) Signed: 04/06/2015 4:26:44 PM By: [name] MD Entered By: [name] on 04/06/2015 13:52:56 Tatar, [name] ([id number]) -------------------------------------------------------------------------------- Debridement Details Patient Name: [name]. Date of Service: 04/06/2015 11:30 AM Medical Record Number: [id number] Patient Account Number: [id number] Date of Birth/Sex: 06/23/1946 (69 y.o. Female) Treating RN: Primary Care Physician: [name] Other Clinician: Referring Physician: [name] Treating Physician/Extender: BURNS, Charlean Sanfilippo in Treatment: 4 Debridement Performed for Wound #1 Left,Lateral Lower Leg Assessment: Performed By: Physician BURNS, Teressa Senter., MD Debridement: Open Wound/Selective Debridement Selective Description: Pre-procedure Yes Verification/Time Out Taken: Start Time: 11:30 Pain Control: Lidocaine 4% Topical Solution Level: Non-Viable Tissue Total Area Debrided (L x 0.6 (cm) x 0.6 (cm) = 0.36 (cm) W): Tissue and other Non-Viable, Eschar, Fibrin/Slough material debrided: Instrument: Curette Bleeding: None End Time: 11:31 Procedural Pain: 3 Post Procedural Pain: 3 Response to Treatment: Procedure was tolerated well Post Debridement Measurements of Total Wound Length: (cm) 0.6 Width: (cm) 0.6 Depth: (cm) 0.2 Volume: (cm) 0.057 Electronic  Signature(s) Signed: 04/06/2015 4:26:44 PM By: [name] MD Entered By: [name] on 04/06/2015 13:52:47 Lambe, [name] ([id number]) -------------------------------------------------------------------------------- HPI Details Patient Name: [name]. Date of Service: 04/06/2015 11:30 AM Medical Record Number: [id number] Patient Account Number: [id number] Date of Birth/Sex: 04/26/1946 (69 y.o. Female) Treating RN: Primary Care Physician: [name] Other Clinician: Referring Physician: [name] Treating Physician/Extender: BURNS, Charlean Sanfilippo in Treatment: 4 History of Present Illness HPI Description: Pleasant 69 year old with history of chronic venous insufficiency. No diabetes or peripheral vascular disease. Left ABI 1.29. Questionable history of left lower extremity DVT. She developed a recurrent ulceration on her left lateral calf in December 2015, which she attributes to poor diet and subsequent lower extremity edema. She underwent endovenous laser ablation of her left greater saphenous vein in 2010. She underwent laser ablation of accessory branch of left GSV in April 2016 by Dr. Kellie Simmering at [facility]. She was previously wearing Unna boots, which she tolerated well. Tolerating 2 layer compression and cadexomer iodine. She returns to clinic for follow-up and is without new complaints. She denies any significant pain at this time. She reports persistent pain with pressure. No claudication or ischemic rest pain. No fever or chills. No significant drainage. Electronic Signature(s) Signed: 04/06/2015 4:26:44 PM By: [name] MD Entered By: [name] on 04/06/2015 13:53:12 Vanduyn, [name] ([id number]) -------------------------------------------------------------------------------- Physical Exam Details Patient Name: [name]. Date of Service: 04/06/2015 11:30 AM Medical Record Number: [id number] Patient Account Number:  [id number] Date of Birth/Sex: November 05, 1946 (69 y.o. Female) Treating RN: Primary Care Physician: [name] Other Clinician: Referring Physician: [name] Treating Physician/Extender: BURNS, Darrill Vreeland Weeks in Treatment: 4 Constitutional . Pulse regular. Respirations normal and unlabored. Afebrile. . Notes Left lateral calf ulceration improved with compression. Covered with small eschar, partially excised. No exposed deep structures. No evidence for infection. No features to suggest malignancy/melanoma. 1+ pitting edema. No palpable pedal pulses. Dopplerable DP and PT, both monophasic. ABIs abnormally elevated at 1.29. Electronic Signature(s) Signed: 04/06/2015 4:26:44 PM By: [name] MD Entered  By: [name] on 04/06/2015 13:53:53 Schwarz, [name] ([id number]) -------------------------------------------------------------------------------- Physician Orders Details Patient Name: [name]. Date of Service: 04/06/2015 11:30 AM Medical Record Number: [id number] Patient Account Number: [id number] Date of Birth/Sex: 03/01/1946 (69 y.o. Female) Treating RN: [name], RN, [name], Velva Harman Primary Care Physician: [name] Other Clinician: Referring Physician: [name] Treating Physician/Extender: BURNS, Charlean Sanfilippo in Treatment: 4 Verbal / Phone Orders: Yes Clinician: Afful, RN, [name], Rita Read Back and Verified: Yes Diagnosis Coding Wound Cleansing Wound #1 Left,Lateral Lower Leg o May Shower, gently pat wound dry prior to applying new dressing. o May shower with protection. Anesthetic Wound #1 Left,Lateral Lower Leg o Topical Lidocaine 4% cream applied to wound bed prior to debridement Primary Wound Dressing Wound #1 Left,Lateral Lower Leg o Iodoflex Secondary Dressing Wound #1 Left,Lateral Lower Leg o Dry Gauze Dressing Change Frequency Wound #1 Left,Lateral Lower Leg o Change dressing every week Follow-up Appointments Wound  #1 Left,Lateral Lower Leg o Return Appointment in 1 week. Edema Control Wound #1 Left,Lateral Lower Leg o 2 Layer Lite Compression System - Left Lower Extremity o Elevate legs to the level of the heart and pump ankles as often as possible Electronic Signature(s) Signed: 04/06/2015 4:26:44 PM By: [name] MD Signed: 04/06/2015 4:53:09 PM By: Regan Lemming [name], RN SANTORIA, CHASON ([id number]) Entered By: Regan Lemming on 04/06/2015 11:50:11 DEMITRA, DANLEY ([id number]) -------------------------------------------------------------------------------- Problem List Details Patient Name: LEYTON, BROWNLEE. Date of Service: 04/06/2015 11:30 AM Medical Record Number: [id number] Patient Account Number: [id number] Date of Birth/Sex: Sep 16, 1946 (69 y.o. Female) Treating RN: Primary Care Physician: [name] Other Clinician: Referring Physician: [name] Treating Physician/Extender: BURNS, Charlean Sanfilippo in Treatment: 4 Active Problems ICD-10 Encounter Code Description Active Date Diagnosis I83.222 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 of 03/09/2015 Yes calf and inflammation I87.2 Venous insufficiency (chronic) (peripheral) 03/09/2015 Yes Z85.820 Personal history of malignant melanoma of skin 03/09/2015 Yes Inactive Problems Resolved Problems Electronic Signature(s) Signed: 04/06/2015 4:26:44 PM By: [name] MD Entered By: [name] on 04/06/2015 13:51:46 Lodge, [name] ([id number]) -------------------------------------------------------------------------------- Progress Note Details Patient Name: PAISYN, GUERCIO. Date of Service: 04/06/2015 11:30 AM Medical Record Number: [id number] Patient Account Number: [id number] Date of Birth/Sex: May 17, 1946 (69 y.o. Female) Treating RN: Primary Care Physician: [name] Other Clinician: Referring Physician: [name] Treating Physician/Extender: BURNS, Charlean Sanfilippo in Treatment:  4 Subjective Chief Complaint Information obtained from Patient Left calf venous stasis ulceration. History of Present Illness (HPI) Pleasant 69 year old with history of chronic venous insufficiency. No diabetes or peripheral vascular disease. Left ABI 1.29. Questionable history of left lower extremity DVT. She developed a recurrent ulceration on her left lateral calf in December 2015, which she attributes to poor diet and subsequent lower extremity edema. She underwent endovenous laser ablation of her left greater saphenous vein in 2010. She underwent laser ablation of accessory branch of left GSV in April 2016 by Dr. Kellie Simmering at [facility]. She was previously wearing Unna boots, which she tolerated well. Tolerating 2 layer compression and cadexomer iodine. She returns to clinic for follow-up and is without new complaints. She denies any significant pain at this time. She reports persistent pain with pressure. No claudication or ischemic rest pain. No fever or chills. No significant drainage. Objective Constitutional Pulse regular. Respirations normal and unlabored. Afebrile. Vitals Time Taken: 11:28 AM, Height: 63 in, Weight: 214.7 lbs, BMI: 38, Temperature: 97.8 F, Pulse: 65 bpm, Respiratory Rate: 16 breaths/min, Blood Pressure: 122/63 mmHg.  General Notes: Left lateral calf ulceration improved with compression. Covered with small eschar, partially excised. No exposed deep structures. No evidence for infection. No features to suggest malignancy/melanoma. 1+ pitting edema. No palpable pedal pulses. Dopplerable DP and PT, both monophasic. ABIs abnormally elevated at 1.29. Integumentary (Hair, Skin) Wound #1 status is Open. Original cause of wound was Gradually Appeared. The wound is located on the [place]. ([id number]) Left,Lateral Lower Leg. The wound measures 0.6cm length x 0.6cm width x 0.1cm depth; 0.283cm^2 area and 0.028cm^3 volume. The wound is limited to skin breakdown.  There is no tunneling or undermining noted. There is a small amount of serous drainage noted. The wound margin is indistinct and nonvisible. There is no granulation within the wound bed. There is a large (67-100%) amount of necrotic tissue within the wound bed including Eschar. The periwound skin appearance exhibited: Dry/Scaly. The periwound skin appearance did not exhibit: Callus, Crepitus, Excoriation, Fluctuance, Friable, Induration, Localized Edema, Rash, Scarring, Maceration, Moist, Atrophie Blanche, Cyanosis, Ecchymosis, Hemosiderin Staining, Mottled, Pallor, Rubor, Erythema. Periwound temperature was noted as No Abnormality. The periwound has tenderness on palpation. Assessment Active Problems ICD-10 I83.222 - 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 of calf and inflammation I87.2 - Venous insufficiency (chronic) (peripheral) Z85.820 - Personal history of malignant melanoma of skin Left calf venous stasis ulcer. Procedures Wound #1 Wound #1 is a Venous Leg Ulcer located on the Left,Lateral Lower Leg . There was a Non-Viable Tissue Open Wound/Selective [phone]) debridement with total area of 0.36 sq cm performed by BURNS, Teressa Senter., MD. with the following instrument(s): Curette to remove Non-Viable tissue/material including Fibrin/Slough and Eschar after achieving pain control using Lidocaine 4% Topical Solution. A time out was conducted prior to the start of the procedure. There was no bleeding. The procedure was tolerated well with a pain level of 3 throughout and a pain level of 3 following the procedure. Post Debridement Measurements: 0.6cm length x 0.6cm width x 0.2cm depth; 0.057cm^3 volume. Plan Wound Cleansing: Wound #1 Left,Lateral Lower Leg: VEVA, GRIMLEY ([id number]) May Shower, gently pat wound dry prior to applying new dressing. May shower with protection. Anesthetic: Wound #1 Left,Lateral Lower Leg: Topical Lidocaine 4% cream applied to wound  bed prior to debridement Primary Wound Dressing: Wound #1 Left,Lateral Lower Leg: Iodoflex Secondary Dressing: Wound #1 Left,Lateral Lower Leg: Dry Gauze Dressing Change Frequency: Wound #1 Left,Lateral Lower Leg: Change dressing every week Follow-up Appointments: Wound #1 Left,Lateral Lower Leg: Return Appointment in 1 week. Edema Control: Wound #1 Left,Lateral Lower Leg: 2 Layer Lite Compression System - Left Lower Extremity Elevate legs to the level of the heart and pump ankles as often as possible Continue Iodoflex and 2 layer compression. Electronic Signature(s) Signed: 04/06/2015 4:26:44 PM By: [name] MD Entered By: [name] on 04/06/2015 13:54:34 Brilliant, [name] ([id number]) -------------------------------------------------------------------------------- SuperBill Details Patient Name: MESHAWN, OCONNOR. Date of Service: 04/06/2015 Medical Record Number: [id number] Patient Account Number: [id number] Date of Birth/Sex: 1946/05/24 (69 y.o. Female) Treating RN: Primary Care Physician: [name] Other Clinician: Referring Physician: [name] Treating Physician/Extender: BURNS, Charlean Sanfilippo in Treatment: 4 Diagnosis Coding ICD-10 Codes Code Description N17.001 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 of calf and inflammation I87.2 Venous insufficiency (chronic) (peripheral) Z85.820 Personal history of malignant melanoma of skin Facility Procedures CPT4: Description Modifier Quantity Code [id number] - DEBRIDE WOUND 1ST 20 SQ CM OR < 1 ICD-10 Description Diagnosis I83.222 Varicose veins of left lower extremity with  both ulcer of calf and inflammation Physician Procedures CPT4: Description Modifier Quantity Code [id number] - WC PHYS DEBR WO ANESTH 20 SQ CM 1 ICD-10 Description Diagnosis I83.222 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 of calf and inflammation Electronic Signature(s) Signed: 04/06/2015 4:26:44  PM By: [name] MD Entered By: [name] on 04/06/2015 13:54:50

## 2015-04-07 NOTE — Progress Notes (Signed)
[name] ([id number]) Visit Report for 04/06/2015 Arrival Information Details Patient Name: [name], [name]. Date of Service: 04/06/2015 11:30 AM Medical Record Number: [id number] Patient Account Number: [id number] Date of Birth/Sex: 1945-12-05 (69 y.o. Female) Treating RN: Montey Hora Primary Care Physician: [name] Other Clinician: Referring Physician: [name] Treating Physician/Extender: BURNS, Charlean Sanfilippo in Treatment: 4 Visit Information History Since Last Visit Added or deleted any medications: No Patient Arrived: Ambulatory Any new allergies or adverse reactions: No Arrival Time: 11:25 Had a fall or experienced change in No Accompanied By: self activities of daily living that may affect Transfer Assistance: None risk of falls: Patient Identification Verified: Yes Signs or symptoms of abuse/neglect since last No Secondary Verification Process Yes visito Completed: Hospitalized since last visit: No Patient Has Alerts: Yes Pain Present Now: No Patient Alerts: NO BPs (R) arm Electronic Signature(s) Signed: 04/06/2015 4:52:49 PM By: Montey Hora Entered By: Montey Hora on 04/06/2015 11:26:11 [name], Amber Child ([id number]) -------------------------------------------------------------------------------- Encounter Discharge Information Details Patient Name: [name], [name]. Date of Service: 04/06/2015 11:30 AM Medical Record Number: [id number] Patient Account Number: [id number] Date of Birth/Sex: 1946/05/28 (69 y.o. Female) Treating RN: Primary Care Physician: [name] Other Clinician: Referring Physician: [name] Treating Physician/Extender: BURNS, Charlean Sanfilippo in Treatment: 4 Encounter Discharge Information Items Discharge Pain Level: 0 Discharge Condition: Stable Ambulatory Status: Ambulatory Discharge Destination: Home Transportation: Private Auto Accompanied By: self Schedule Follow-up Appointment: Yes Medication  Reconciliation completed and provided to Patient/Care No Ernest Popowski: Provided on Clinical Summary of Care: 04/06/2015 Form Type Recipient Paper Patient LP Electronic Signature(s) Signed: 04/06/2015 12:05:41 PM By: Montey Hora Previous Signature: 04/06/2015 11:56:38 AM Version By: Ruthine Dose Entered By: Montey Hora on 04/06/2015 12:05:41 [name], Amber Child ([id number]) -------------------------------------------------------------------------------- Lower Extremity Assessment Details Patient Name: [name], [name]. Date of Service: 04/06/2015 11:30 AM Medical Record Number: [id number] Patient Account Number: [id number] Date of Birth/Sex: [birth year] (69 y.o. Female) Treating RN: Montey Hora Primary Care Physician: [name] Other Clinician: Referring Physician: [name] Treating Physician/Extender: BURNS, Charlean Sanfilippo in Treatment: 4 Edema Assessment Assessed: [Left: No] [Right: No] E[Left: dema] [Right: :] Calf Left: Right: Point of Measurement: 28 cm From Medial Instep 38.1 cm cm Ankle Left: Right: Point of Measurement: 10 cm From Medial Instep 23.7 cm cm Vascular Assessment Pulses: Posterior Tibial Palpable: [Left:Yes] Dorsalis Pedis Palpable: [Left:Yes] Extremity colors, hair growth, and conditions: Extremity Color: [Left:Normal] Hair Growth on Extremity: [Left:Yes] Temperature of Extremity: [Left:Warm] Capillary Refill: [Left:< 3 seconds] Toe Nail Assessment Left: Right: Thick: No Discolored: No Deformed: No Improper Length and Hygiene: Yes Electronic Signature(s) Signed: 04/06/2015 4:52:49 PM By: Montey Hora Entered By: Montey Hora on 04/06/2015 11:37:20 [name], [name] ([id number]) [name], Amber Child ([id number]) -------------------------------------------------------------------------------- Multi Wound Chart Details Patient Name: [name], [name]. Date of Service: 04/06/2015 11:30 AM Medical Record Number: [id number] Patient Account Number:  [id number] Date of Birth/Sex: [birth year] (69 y.o. Female) Treating RN: [name], RN, [name], Velva Harman Primary Care Physician: [name] Other Clinician: Referring Physician: [name] Treating Physician/Extender: BURNS, Charlean Sanfilippo in Treatment: 4 Vital Signs Height(in): 63 Pulse(bpm): 65 Weight(lbs): 214.7 Blood Pressure 122/63 (mmHg): Body Mass Index(BMI): 38 Temperature(F): 97.8 Respiratory Rate 16 (breaths/min): Photos: [1:No Photos] [N/A:N/A] Wound Location: [1:Left Lower Leg - Lateral] [N/A:N/A] Wounding Event: [1:Gradually Appeared] [N/A:N/A] Primary Etiology: [1:Venous Leg Ulcer] [N/A:N/A] Comorbid History: [1:Cataracts, Asthma, Sleep Apnea, Deep Vein Thrombosis, Hypertension, Peripheral Venous Disease, Osteoarthritis, Received Chemotherapy, Received Radiation] [N/A:N/A] Date Acquired: [1:11/05/2014] [N/A:N/A] Weeks of Treatment: [1:4] [N/A:N/A] Wound Status: [1:Open] [N/A:N/A] Measurements L  x W x D 0.6x0.6x0.1 [N/A:N/A] (cm) Area (cm) : [1:0.283] [N/A:N/A] Volume (cm) : [1:0.028] [N/A:N/A] % Reduction in Area: [1:14.20%] [N/A:N/A] % Reduction in Volume: 15.20% [N/A:N/A] Classification: [1:Full Thickness Without Exposed Support Structures] [N/A:N/A] Exudate Amount: [1:Small] [N/A:N/A] Exudate Type: [1:Serous] [N/A:N/A] Exudate Color: [1:amber] [N/A:N/A] Wound Margin: [1:Indistinct, nonvisible] [N/A:N/A] Granulation Amount: [1:None Present (0%)] [N/A:N/A] Necrotic Amount: [1:Large (67-100%)] [N/A:N/A] Necrotic Tissue: Eschar N/A N/A Exposed Structures: Fascia: No N/A N/A Fat: No Tendon: No Muscle: No Joint: No Bone: No Limited to Skin Breakdown Epithelialization: None N/A N/A Periwound Skin Texture: Edema: No N/A N/A Excoriation: No Induration: No Callus: No Crepitus: No Fluctuance: No Friable: No Rash: No Scarring: No Periwound Skin Dry/Scaly: Yes N/A N/A Moisture: Maceration: No Moist: No Periwound Skin Color: Atrophie Blanche: No N/A  N/A Cyanosis: No Ecchymosis: No Erythema: No Hemosiderin Staining: No Mottled: No Pallor: No Rubor: No Temperature: No Abnormality N/A N/A Tenderness on Yes N/A N/A Palpation: Wound Preparation: Ulcer Cleansing: Wound N/A N/A Cleanser Topical Anesthetic Applied: Other: Lidocaine 4% Ointment Treatment Notes Electronic Signature(s) Signed: 04/06/2015 4:53:09 PM By: Regan Lemming [name], RN Entered By: Regan Lemming on 04/06/2015 11:48:44 [name], [name] ([id number]) -------------------------------------------------------------------------------- Multi-Disciplinary Care Plan Details Patient Name: [name], [name]. Date of Service: 04/06/2015 11:30 AM Medical Record Number: [id number] Patient Account Number: [id number] Date of Birth/Sex: [birth year] (69 y.o. Female) Treating RN: Afful, RN, [name], Velva Harman Primary Care Physician: [name] Other Clinician: Referring Physician: [name] Treating Physician/Extender: BURNS, Charlean Sanfilippo in Treatment: 4 Active Inactive Abuse / Safety / Falls / Self Care Management Nursing Diagnoses: Potential for falls Goals: Patient will remain injury free Date Initiated: 03/09/2015 Goal Status: Active Patient/caregiver will verbalize understanding of skin care regimen Date Initiated: 03/09/2015 Goal Status: Active Patient/caregiver will verbalize/demonstrate measures taken to prevent injury and/or falls Date Initiated: 03/09/2015 Goal Status: Active Patient/caregiver will verbalize/demonstrate understanding of what to do in case of emergency Date Initiated: 03/09/2015 Goal Status: Active Interventions: Assess fall risk on admission and as needed Assess: immobility, friction, shearing, incontinence upon admission and as needed Assess impairment of mobility on admission and as needed per policy Provide education on fall prevention Provide education on safe transfers Notes: Nutrition Nursing Diagnoses: Potential for alteratiion in  Nutrition/Potential for imbalanced nutrition Goals: Patient/caregiver agrees to and verbalizes understanding of need to use nutritional supplements and/or vitamins as prescribed [name], [name] ([id number]) Date Initiated: 03/09/2015 Goal Status: Active Interventions: Assess patient nutrition upon admission and as needed per policy Provide education on nutrition Treatment Activities: Education provided on Nutrition : 03/09/2015 Notes: Orientation to the Wound Care Program Nursing Diagnoses: Knowledge deficit related to the wound healing center program Goals: Patient/caregiver will verbalize understanding of the [facility] Date Initiated: 03/09/2015 Goal Status: Active Interventions: Provide education on orientation to the wound center Notes: Venous Leg Ulcer Nursing Diagnoses: Actual venous Insuffiency (use after diagnosis is confirmed) Knowledge deficit related to disease process and management Goals: Non-invasive venous studies are completed as ordered Date Initiated: 03/09/2015 Goal Status: Active Patient will maintain optimal edema control Date Initiated: 03/09/2015 Goal Status: Active Patient/caregiver will verbalize understanding of disease process and disease management Date Initiated: 03/09/2015 Goal Status: Active Verify adequate tissue perfusion prior to therapeutic compression application Date Initiated: 03/09/2015 Goal Status: Active [name], [name] ([id number]) Interventions: Assess peripheral edema status every visit. Compression as ordered Provide education on venous insufficiency Treatment Activities: Non-invasive vascular studies : 04/06/2015 Therapeutic compression applied : 04/06/2015 Notes: Wound/Skin Impairment Nursing Diagnoses: Impaired tissue integrity Knowledge  deficit related to ulceration/compromised skin integrity Goals: Patient/caregiver will verbalize understanding of skin care regimen Date Initiated: 03/09/2015 Goal Status:  Active Ulcer/skin breakdown will heal within 14 weeks Date Initiated: 03/09/2015 Goal Status: Active Interventions: Assess patient/caregiver ability to obtain necessary supplies Assess patient/caregiver ability to perform ulcer/skin care regimen upon admission and as needed Assess ulceration(s) every visit Provide education on ulcer and skin care Treatment Activities: Skin care regimen initiated : 04/06/2015 Topical wound management initiated : 04/06/2015 Notes: Electronic Signature(s) Signed: 04/06/2015 4:53:09 PM By: Regan Lemming [name], RN Entered By: Regan Lemming on 04/06/2015 11:45:23 [name] ([id number]) -------------------------------------------------------------------------------- Patient/Caregiver Education Details Patient Name: [name], [name]. Date of Service: 04/06/2015 11:30 AM Medical Record Number: [id number] Patient Account Number: [id number] Date of Birth/Gender: 1946-04-09 (68 y.o. Female) Treating RN: Montey Hora Primary Care Physician: [name] Other Clinician: Referring Physician: [name] Treating Physician/Extender: BURNS, Charlean Sanfilippo in Treatment: 4 Education Assessment Education Provided To: Patient Education Topics Provided Wound/Skin Impairment: Handouts: Other: come in for rewrap if wrap slips or beco47mes too tight Methods: Explain/Verbal Responses: State content correctly Electronic Signature(s) Signed: 04/06/2015 12:06:10 PM By: Montey Hora Entered By: Montey Hora on 04/06/2015 12:06:10 [name] ([id number]) -------------------------------------------------------------------------------- Wound Assessment Details Patient Name: [name], [name]. Date of Service: 04/06/2015 11:30 AM Medical Record Number: [id number] Patient Account Number: [id number] Date of Birth/Sex: 11-05-1946 (69 y.o. Female) Treating RN: Montey Hora Primary Care Physician: [name] Other Clinician: Referring Physician: [name] Treating Physician/Extender: BURNS, Charlean Sanfilippo in Treatment: 4 Wound Status Wound Number: 1 Primary Venous Leg Ulcer Etiology: Wound Location: Left Lower Leg - Lateral Wound Open Wounding Event: Gradually Appeared Status: Date Acquired: 11/05/2014 Comorbid Cataracts, Asthma, Sleep Apnea, Deep Weeks Of Treatment: 4 History: Vein Thrombosis, Hypertension, Clustered Wound: No Peripheral Venous Disease, Osteoarthritis, Received Chemotherapy, Received Radiation Photos Photo Uploaded By: Montey Hora on 04/06/2015 12:59:57 Wound Measurements Length: (cm) 0.6 Width: (cm) 0.6 Depth: (cm) 0.1 Area: (cm) 0.283 Volume: (cm) 0.028 % Reduction in Area: 14.2% % Reduction in Volume: 15.2% Epithelialization: None Tunneling: No Undermining: No Wound Description Full Thickness Without Exposed Classification: Support Structures Wound Margin: Indistinct, nonvisible Exudate Small Amount: Exudate Type: Serous Exudate Color: amber Foul Odor After Cleansing: No Wound Bed [name], [name]. ([id number]) Granulation Amount: None Present (0%) Exposed Structure Necrotic Amount: Large (67-100%) Fascia Exposed: No Necrotic Quality: Eschar Fat Layer Exposed: No Tendon Exposed: No Muscle Exposed: No Joint Exposed: No Bone Exposed: No Limited to Skin Breakdown Periwound Skin Texture Texture Color No Abnormalities Noted: No No Abnormalities Noted: No Callus: No Atrophie Blanche: No Crepitus: No Cyanosis: No Excoriation: No Ecchymosis: No Fluctuance: No Erythema: No Friable: No Hemosiderin Staining: No Induration: No Mottled: No Localized Edema: No Pallor: No Rash: No Rubor: No Scarring: No Temperature / Pain Moisture Temperature: No Abnormality No Abnormalities Noted: No Tenderness on Palpation: Yes Dry / Scaly: Yes Maceration: No Moist: No Wound Preparation Ulcer Cleansing: Wound Cleanser Topical Anesthetic Applied: Other: Lidocaine 4% Ointment  , Treatment Notes Wound #1 (Left, Lateral Lower Leg) 1. Cleansed with: Cleanse wound with antibacterial soap and water 2. Anesthetic Topical Lidocaine 4% cream to wound bed prior to debridement 4. Dressing Applied: Iodoflex 5. Secondary Dressing Applied Dry Gauze 7. Secured with 2 Layer Lite Compression System - Left Lower Extremity Electronic Signature(s) Signed: 04/06/2015 4:52:49 PM By: Jeri Cos, Amber Child ([id number]) Entered By: Montey Hora on 04/06/2015 11:36:11 [name], [name] ([id number]) -------------------------------------------------------------------------------- Vitals Details Patient Name: JILLAYNE, WITTE. Date of Service:  04/06/2015 11:30 AM Medical Record Number: [id number] Patient Account Number: [id number] Date of Birth/Sex: Nov 03, 1946 (68 y.o. Female) Treating RN: Montey Hora Primary Care Physician: [name] Other Clinician: Referring Physician: [name] Treating Physician/Extender: BURNS, Charlean Sanfilippo in Treatment: 4 Vital Signs Time Taken: 11:28 Temperature (F): 97.8 Height (in): 63 Pulse (bpm): 65 Weight (lbs): 214.7 Respiratory Rate (breaths/min): 16 Body Mass Index (BMI): 38 Blood Pressure (mmHg): 122/63 Reference Range: 80 - 120 mg / dl Electronic Signature(s) Signed: 04/06/2015 4:52:49 PM By: Montey Hora Entered By: Montey Hora on 04/06/2015 11:29:45

## 2015-04-07 NOTE — Telephone Encounter (Signed)
Spoke with patient.

## 2015-04-08 ENCOUNTER — Encounter: Payer: Self-pay | Admitting: *Deleted

## 2015-04-13 ENCOUNTER — Encounter: Payer: Medicare Other | Admitting: Surgery

## 2015-04-13 DIAGNOSIS — I872 Venous insufficiency (chronic) (peripheral): Secondary | ICD-10-CM | POA: Diagnosis not present

## 2015-04-13 DIAGNOSIS — I83222 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 of calf and inflammation: Secondary | ICD-10-CM | POA: Diagnosis not present

## 2015-04-13 DIAGNOSIS — L97229 Non-pressure chronic ulcer of left calf with unspecified severity: Secondary | ICD-10-CM | POA: Diagnosis not present

## 2015-04-13 DIAGNOSIS — R600 Localized edema: Secondary | ICD-10-CM | POA: Diagnosis not present

## 2015-04-13 DIAGNOSIS — Z87891 Personal history of nicotine dependence: Secondary | ICD-10-CM | POA: Diagnosis not present

## 2015-04-13 DIAGNOSIS — Z85820 Personal history of malignant melanoma of skin: Secondary | ICD-10-CM | POA: Diagnosis not present

## 2015-04-13 NOTE — Progress Notes (Signed)
[name] ([id number]) Visit Report for 04/13/2015 Chief Complaint Document Details Patient Name: [name], [name]. Date of Service: 04/13/2015 10:45 AM Medical Record Number: [id number] Patient Account Number: [id number] Date of Birth/Sex: 1945/11/13 (69 y.o. Female) Treating RN: Primary Care Physician: [name] Other Clinician: Referring Physician: [name] Treating Physician/Extender: BURNS III, Ashawnti Tangen Weeks in Treatment: 5 Information Obtained from: Patient Chief Complaint Left calf venous stasis ulceration. Electronic Signature(s) Signed: 04/13/2015 3:19:31 PM By: [name] MD Entered By: [name] on 04/13/2015 13:03:43 [name] ([id number]) -------------------------------------------------------------------------------- HPI Details Patient Name: [name], [name]. Date of Service: 04/13/2015 10:45 AM Medical Record Number: [id number] Patient Account Number: [id number] Date of Birth/Sex: 05-02-1946 (69 y.o. Female) Treating RN: Primary Care Physician: [name] Other Clinician: Referring Physician: [name] Treating Physician/Extender: BURNS III, Kateria Cutrona Weeks in Treatment: 5 History of Present Illness HPI Description: Pleasant 69 year old with history of chronic venous insufficiency. No diabetes or peripheral vascular disease. Left ABI 1.29. Questionable history of left lower extremity DVT. She developed a recurrent ulceration on her left lateral calf in December 2015, which she attributes to poor diet and subsequent lower extremity edema. She underwent endovenous laser ablation of her left greater saphenous vein in 2010. She underwent laser ablation of accessory branch of left GSV in April 2016 by Dr. Kellie Simmering at [facility]. She was previously wearing Unna boots, which she tolerated well. Tolerating 2 layer compression and cadexomer iodine. She returns to clinic for follow-up and is without new complaints. She denies any  significant pain at this time. She reports persistent pain with pressure. No claudication or ischemic rest pain. No fever or chills. No drainage. Electronic Signature(s) Signed: 04/13/2015 3:19:31 PM By: [name] MD Entered By: [name] on 04/13/2015 13:04:01 [name], [name] ([id number]) -------------------------------------------------------------------------------- Physical Exam Details Patient Name: [name]. Date of Service: 04/13/2015 10:45 AM Medical Record Number: [id number] Patient Account Number: [id number] Date of Birth/Sex: 08-Jun-1946 (69 y.o. Female) Treating RN: Primary Care Physician: [name] Other Clinician: Referring Physician: [name] Treating Physician/Extender: BURNS III, Ota Ebersole Weeks in Treatment: 5 Constitutional . Pulse regular. Respirations normal and unlabored. Afebrile. . Notes Left lateral calf ulceration completely re-epithelialized. No drainage. No evidence for infection. 1+ pitting edema. No palpable pedal pulses. Dopplerable DP and PT, both monophasic. ABIs abnormally elevated at 1.29. Electronic Signature(s) Signed: 04/13/2015 3:19:31 PM By: [name] MD Entered By: [name] on 04/13/2015 13:04:44 [name] ([id number]) -------------------------------------------------------------------------------- Physician Orders Details Patient Name: [name], [name]. Date of Service: 04/13/2015 10:45 AM Medical Record Number: [id number] Patient Account Number: [id number] Date of Birth/Sex: Mar 24, 1946 (69 y.o. Female) Treating RN: [name], RN, [name], Velva Harman Primary Care Physician: [name] Other Clinician: Referring Physician: [name] Treating Physician/Extender: BURNS III, Charlean Sanfilippo in Treatment: 5 Verbal / Phone Orders: Yes Clinician: Afful, RN, [name], Rita Read Back and Verified: Yes Diagnosis Coding Skin Barriers/Peri-Wound Care o Moisturizing lotion Edema Control o Patient  to wear own Juxtalite compression garment. o Support Garment 30-40 mm/Hg pressure to: Discharge From [facility] Services o Discharge from [place] Completed Electronic Signature(s) Signed: 04/13/2015 11:20:41 AM By: Regan Lemming [name], RN Signed: 04/13/2015 3:19:31 PM By: [name] MD Entered By: Regan Lemming on 04/13/2015 11:20:41 [name], [name] ([id number]) -------------------------------------------------------------------------------- Problem List Details Patient Name: [name], [name]. Date of Service: 04/13/2015 10:45 AM Medical Record Number: [id number] Patient Account Number: [id number] Date of Birth/Sex: 01-Nov-1946 (69 y.o. Female) Treating RN: Primary Care  Physician: [name] Other Clinician: Referring Physician: [name] Treating Physician/Extender: BURNS III, Charlean Sanfilippo in Treatment: 5 Active Problems ICD-10 Encounter Code Description Active Date Diagnosis I83.222 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 of 03/09/2015 Yes calf and inflammation I87.2 Venous insufficiency (chronic) (peripheral) 03/09/2015 Yes Z85.820 Personal history of malignant melanoma of skin 03/09/2015 Yes Inactive Problems Resolved Problems Electronic Signature(s) Signed: 04/13/2015 3:19:31 PM By: [name] MD Entered By: [name] on 04/13/2015 13:03:31 [name] ([id number]) -------------------------------------------------------------------------------- Progress Note Details Patient Name: [name], [name]. Date of Service: 04/13/2015 10:45 AM Medical Record Number: [id number] Patient Account Number: [id number] Date of Birth/Sex: [birth year] (69 y.o. Female) Treating RN: Primary Care Physician: [name] Other Clinician: Referring Physician: [name] Treating Physician/Extender: BURNS III, Dimitri Dsouza Weeks in Treatment: 5 Subjective Chief Complaint Information obtained from Patient Left calf venous stasis  ulceration. History of Present Illness (HPI) Pleasant 69 year old with history of chronic venous insufficiency. No diabetes or peripheral vascular disease. Left ABI 1.29. Questionable history of left lower extremity DVT. She developed a recurrent ulceration on her left lateral calf in December 2015, which she attributes to poor diet and subsequent lower extremity edema. She underwent endovenous laser ablation of her left greater saphenous vein in 2010. She underwent laser ablation of accessory branch of left GSV in April 2016 by Dr. Kellie Simmering at [facility]. She was previously wearing Unna boots, which she tolerated well. Tolerating 2 layer compression and cadexomer iodine. She returns to clinic for follow-up and is without new complaints. She denies any significant pain at this time. She reports persistent pain with pressure. No claudication or ischemic rest pain. No fever or chills. No drainage. Objective Constitutional Pulse regular. Respirations normal and unlabored. Afebrile. Vitals Time Taken: 10:39 AM, Height: 63 in, Weight: 214.7 lbs, BMI: 38, Temperature: 98.1 F, Pulse: 63 bpm, Respiratory Rate: 16 breaths/min, Blood Pressure: 130/72 mmHg. General Notes: Left lateral calf ulceration completely re-epithelialized. No drainage. No evidence for infection. 1+ pitting edema. No palpable pedal pulses. Dopplerable DP and PT, both monophasic. ABIs abnormally elevated at 1.29. Integumentary (Hair, Skin) Wound #1 status is Healed - Epithelialized. Original cause of wound was Gradually Appeared. The wound is located on the Left,Lateral Lower Leg. The wound measures 0cm length x 0cm width x 0cm depth; 0cm^2 [name], [name] ([id number]) area and 0cm^3 volume. The wound is limited to skin breakdown. There is no tunneling noted. There is a small amount of serous drainage noted. The wound margin is indistinct and nonvisible. There is large (67- 100%) pink granulation within the wound bed. There is no  necrotic tissue within the wound bed. The periwound skin appearance exhibited: Localized Edema, Dry/Scaly. The periwound skin appearance did not exhibit: Callus, Crepitus, Excoriation, Fluctuance, Friable, Induration, Rash, Scarring, Maceration, Moist, Atrophie Blanche, Cyanosis, Ecchymosis, Hemosiderin Staining, Mottled, Pallor, Rubor, Erythema. Periwound temperature was noted as No Abnormality. The periwound has tenderness on palpation. Assessment Active Problems ICD-10 I83.222 - 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 of calf and inflammation I87.2 - Venous insufficiency (chronic) (peripheral) Z85.820 - Personal history of malignant melanoma of skin Healed left calf venous stasis ulceration. Plan Skin Barriers/Peri-Wound Care: Moisturizing lotion Edema Control: Patient to wear own Juxtalite compression garment. Support Garment 30-40 mm/Hg pressure to: Discharge From [facility] Services: Discharge from [place] Completed Eucerin cream. Continue to wear a juxta light compression garment or compression stockings daily. Return to clinic prn. Electronic Signature(s) [name], [name] ([id number]) Signed:  04/13/2015 3:19:31 PM By: [name] MD Entered By: [name] on 04/13/2015 13:05:30 [name] ([id number]) -------------------------------------------------------------------------------- SuperBill Details Patient Name: [name], [name]. Date of Service: 04/13/2015 Medical Record Number: [id number] Patient Account Number: [id number] Date of Birth/Sex: January 11, 1946 (69 y.o. Female) Treating RN: Afful, RN, [name], Velva Harman Primary Care Physician: [name] Other Clinician: Referring Physician: [name] Treating Physician/Extender: BURNS III, Charlean Sanfilippo in Treatment: 5 Diagnosis Coding ICD-10 Codes Code Description X50.722 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 of calf and inflammation I87.2 Venous  insufficiency (chronic) (peripheral) Z85.820 Personal history of malignant melanoma of skin Facility Procedures CPT4 Code: [id number] Description: 35825 - WOUND CARE VISIT-LEV 1 EST PT Modifier: Quantity: 1 Physician Procedures CPT4: Description Modifier Quantity Code 1898421 03128 - WC PHYS LEVEL 2 - EST PT 1 ICD-10 Description Diagnosis I83.222 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 of calf and inflammation Electronic Signature(s) Signed: 04/13/2015 3:19:31 PM By: [name] MD Entered By: [name] on 04/13/2015 13:05:49

## 2015-04-13 NOTE — Progress Notes (Signed)
[name] ([id number]) Visit Report for 04/13/2015 Arrival Information Details Patient Name: [name], [name]. Date of Service: 04/13/2015 10:45 AM Medical Record Number: [id number] Patient Account Number: [id number] Date of Birth/Sex: 19-Oct-1946 (69 y.o. Female) Treating RN: Afful, RN, [name], Velva Harman Primary Care Physician: [name] Other Clinician: Referring Physician: [name] Treating Physician/Extender: BURNS III, Charlean Sanfilippo in Treatment: 5 Visit Information History Since Last Visit Any new allergies or adverse reactions: No Patient Arrived: Ambulatory Had a fall or experienced change in No Arrival Time: 10:39 activities of daily living that may affect Accompanied By: self risk of falls: Transfer Assistance: None Signs or symptoms of abuse/neglect since last No Patient Identification Verified: Yes visito Secondary Verification Process Yes Hospitalized since last visit: No Completed: Has Dressing in Place as Prescribed: Yes Patient Has Alerts: Yes Has Compression in Place as Prescribed: Yes Patient Alerts: NO BPs (R) Pain Present Now: No arm Electronic Signature(s) Signed: 04/13/2015 4:29:34 PM By: Regan Lemming [name], RN Entered By: Regan Lemming on 04/13/2015 10:39:31 [name] ([id number]) -------------------------------------------------------------------------------- Clinic Level of Care Assessment Details Patient Name: [name]. Date of Service: 04/13/2015 10:45 AM Medical Record Number: [id number] Patient Account Number: [id number] Date of Birth/Sex: 04-26-1946 (69 y.o. Female) Treating RN: Afful, RN, [name], Velva Harman Primary Care Physician: [name] Other Clinician: Referring Physician: [name] Treating Physician/Extender: BURNS III, Charlean Sanfilippo in Treatment: 5 Clinic Level of Care Assessment Items TOOL 4 Quantity Score []  - Use when only an EandM is performed on FOLLOW-UP visit 0 ASSESSMENTS - Nursing Assessment /  Reassessment []  - Reassessment of Co-morbidities (includes updates in patient status) 0 []  - Reassessment of Adherence to Treatment Plan 0 ASSESSMENTS - Wound and Skin Assessment / Reassessment []  - Simple Wound Assessment / Reassessment - one wound 0 []  - Complex Wound Assessment / Reassessment - multiple wounds 0 []  - Dermatologic / Skin Assessment (not related to wound area) 0 ASSESSMENTS - Focused Assessment []  - Circumferential Edema Measurements - multi extremities 0 []  - Nutritional Assessment / Counseling / Intervention 0 []  - Lower Extremity Assessment (monofilament, tuning fork, pulses) 0 []  - Peripheral Arterial Disease Assessment (using hand held doppler) 0 ASSESSMENTS - Ostomy and/or Continence Assessment and Care []  - Incontinence Assessment and Management 0 []  - Ostomy Care Assessment and Management (repouching, etc.) 0 PROCESS - Coordination of Care X - Simple Patient / Family Education for ongoing care 1 15 []  - Complex (extensive) Patient / Family Education for ongoing care 0 []  - Staff obtains Programmer, systems, Records, Test Results / Process Orders 0 []  - Staff telephones HHA, Nursing Homes / Clarify orders / etc 0 []  - Routine Transfer to another Facility (non-emergent condition) 0 HATTIE, PINE. ([id number]) []  - Routine Hospital Admission (non-emergent condition) 0 []  - New Admissions / Biomedical engineer / Ordering NPWT, Apligraf, etc. 0 []  - Emergency Hospital Admission (emergent condition) 0 X - Simple Discharge Coordination 1 10 []  - Complex (extensive) Discharge Coordination 0 PROCESS - Special Needs []  - Pediatric / Minor Patient Management 0 []  - Isolation Patient Management 0 []  - Hearing / Language / Visual special needs 0 []  - Assessment of Community assistance (transportation, D/C planning, etc.) 0 []  - Additional assistance / Altered mentation 0 []  - Support Surface(s) Assessment (bed, cushion, seat, etc.) 0 INTERVENTIONS - Wound Cleansing /  Measurement []  - Simple Wound Cleansing - one wound 0 []  - Complex Wound Cleansing - multiple wounds 0 X - Wound Imaging (photographs - any number of  wounds) 1 5 []  - Wound Tracing (instead of photographs) 0 []  - Simple Wound Measurement - one wound 0 []  - Complex Wound Measurement - multiple wounds 0 INTERVENTIONS - Wound Dressings []  - Small Wound Dressing one or multiple wounds 0 []  - Medium Wound Dressing one or multiple wounds 0 []  - Large Wound Dressing one or multiple wounds 0 []  - Application of Medications - topical 0 []  - Application of Medications - injection 0 INTERVENTIONS - Miscellaneous []  - External ear exam 0 ROSEANA, RHINE. ([id number]) []  - Specimen Collection (cultures, biopsies, blood, body fluids, etc.) 0 []  - Specimen(s) / Culture(s) sent or taken to Lab for analysis 0 []  - Patient Transfer (multiple staff / Harrel Lemon Lift / Similar devices) 0 []  - Simple Staple / Suture removal (25 or less) 0 []  - Complex Staple / Suture removal (26 or more) 0 []  - Hypo / Hyperglycemic Management (close monitor of Blood Glucose) 0 []  - Ankle / Brachial Index (ABI) - do not check if billed separately 0 X - Vital Signs 1 5 Has the patient been seen at the hospital within the last three years: Yes Total Score: 35 Level Of Care: New/Established - Level 1 Electronic Signature(s) Signed: 04/13/2015 4:29:34 PM By: Regan Lemming [name], RN Entered By: Regan Lemming on 04/13/2015 11:21:14 [name] ([id number]) -------------------------------------------------------------------------------- Encounter Discharge Information Details Patient Name: [name]. Date of Service: 04/13/2015 10:45 AM Medical Record Number: [id number] Patient Account Number: [id number] Date of Birth/Sex: 1946-04-21 (69 y.o. Female) Treating RN: Afful, RN, [name], Velva Harman Primary Care Physician: [name] Other Clinician: Referring Physician: [name] Treating Physician/Extender: BURNS III,  Charlean Sanfilippo in Treatment: 5 Encounter Discharge Information Items Discharge Pain Level: 0 Discharge Condition: Stable Ambulatory Status: Ambulatory Discharge Destination: Home Transportation: Private Auto Accompanied By: self Schedule Follow-up Appointment: No Medication Reconciliation completed and provided to Patient/Care No Gilberta Peeters: Provided on Clinical Summary of Care: 04/13/2015 Form Type Recipient Paper Patient LP Electronic Signature(s) Signed: 04/13/2015 4:29:34 PM By: Regan Lemming [name], RN Previous Signature: 04/13/2015 11:20:40 AM Version By: Ruthine Dose Entered By: Regan Lemming on 04/13/2015 11:22:09 [name] ([id number]) -------------------------------------------------------------------------------- General Visit Notes Details Patient Name: CIRE, DEYARMIN. Date of Service: 04/13/2015 10:45 AM Medical Record Number: [id number] Patient Account Number: [id number] Date of Birth/Sex: 1946/01/21 (69 y.o. Female) Treating RN: [name], RN, [name], Velva Harman Primary Care Physician: [name] Other Clinician: Referring Physician: [name] Treating Physician/Extender: BURNS III, WALTER Weeks in Treatment: 5 Notes Patient discharged, juxtalite applied. Electronic Signature(s) Signed: 04/13/2015 4:29:34 PM By: Regan Lemming [name], RN Entered By: Regan Lemming on 04/13/2015 11:22:39 AERIELLE, STOKLOSA ([id number]) -------------------------------------------------------------------------------- Lower Extremity Assessment Details Patient Name: SHANTANA, CHRISTON. Date of Service: 04/13/2015 10:45 AM Medical Record Number: [id number] Patient Account Number: [id number] Date of Birth/Sex: 07/30/1946 (69 y.o. Female) Treating RN: Afful, RN, [name], Velva Harman Primary Care Physician: [name] Other Clinician: Referring Physician: [name] Treating Physician/Extender: BURNS III, WALTER Weeks in Treatment: 5 Edema Assessment Assessed: [Left: No] [Right: No] E[Left: dema]  [Right: :] Calf Left: Right: Point of Measurement: 28 cm From Medial Instep 39.1 cm cm Ankle Left: Right: Point of Measurement: 10 cm From Medial Instep 23.7 cm cm Vascular Assessment Claudication: Claudication Assessment [Left:None] Pulses: Posterior Tibial Dorsalis Pedis Palpable: [Left:Yes] Extremity colors, hair growth, and conditions: Extremity Color: [Left:Mottled] Hair Growth on Extremity: [Left:Yes] Temperature of Extremity: [Left:Warm] Capillary Refill: [Left:< 3 seconds] Dependent Rubor: [Left:No] Blanched when Elevated: [Left:No] Lipodermatosclerosis: [Left:No] Toe Nail Assessment Left: Right: Thick:  Yes Discolored: Yes Deformed: Yes Improper Length and Hygiene: Yes ARTHELIA, CALLICOTT ([id number]) Electronic Signature(s) Signed: 04/13/2015 4:29:34 PM By: Regan Lemming [name], RN Entered By: Regan Lemming on 04/13/2015 10:47:31 ANDILYNN, DELAVEGA ([id number]) -------------------------------------------------------------------------------- Multi Wound Chart Details Patient Name: KAILYNN, SATTERLY. Date of Service: 04/13/2015 10:45 AM Medical Record Number: [id number] Patient Account Number: [id number] Date of Birth/Sex: Aug 21, 1946 (69 y.o. Female) Treating RN: [name], RN, [name], Velva Harman Primary Care Physician: [name] Other Clinician: Referring Physician: [name] Treating Physician/Extender: BURNS III, WALTER Weeks in Treatment: 5 Vital Signs Height(in): 63 Pulse(bpm): 63 Weight(lbs): 214.7 Blood Pressure 130/72 (mmHg): Body Mass Index(BMI): 38 Temperature(F): 98.1 Respiratory Rate 16 (breaths/min): Photos: [1:No Photos] [N/A:N/A] Wound Location: [1:Left Lower Leg - Lateral] [N/A:N/A] Wounding Event: [1:Gradually Appeared] [N/A:N/A] Primary Etiology: [1:Venous Leg Ulcer] [N/A:N/A] Comorbid History: [1:Cataracts, Asthma, Sleep Apnea, Deep Vein Thrombosis, Hypertension, Peripheral Venous Disease, Osteoarthritis, Received Chemotherapy, Received Radiation]  [N/A:N/A] Date Acquired: [1:11/05/2014] [N/A:N/A] Weeks of Treatment: [1:5] [N/A:N/A] Wound Status: [1:Open] [N/A:N/A] Measurements L x W x D 0.1x0.1x0.1 [N/A:N/A] (cm) Area (cm) : [1:0.008] [N/A:N/A] Volume (cm) : [1:0.001] [N/A:N/A] % Reduction in Area: [1:97.60%] [N/A:N/A] % Reduction in Volume: 97.00% [N/A:N/A] Classification: [1:Full Thickness Without Exposed Support Structures] [N/A:N/A] Exudate Amount: [1:Small] [N/A:N/A] Exudate Type: [1:Serous] [N/A:N/A] Exudate Color: [1:amber] [N/A:N/A] Wound Margin: [1:Indistinct, nonvisible] [N/A:N/A] Granulation Amount: [1:Large (67-100%)] [N/A:N/A] Granulation Quality: [1:Pink] [N/A:N/A] Necrotic Amount: None Present (0%) N/A N/A Exposed Structures: Fascia: No N/A N/A Fat: No Tendon: No Muscle: No Joint: No Bone: No Limited to Skin Breakdown Epithelialization: Large (67-100%) N/A N/A Periwound Skin Texture: Edema: Yes N/A N/A Excoriation: No Induration: No Callus: No Crepitus: No Fluctuance: No Friable: No Rash: No Scarring: No Periwound Skin Dry/Scaly: Yes N/A N/A Moisture: Maceration: No Moist: No Periwound Skin Color: Atrophie Blanche: No N/A N/A Cyanosis: No Ecchymosis: No Erythema: No Hemosiderin Staining: No Mottled: No Pallor: No Rubor: No Temperature: No Abnormality N/A N/A Tenderness on Yes N/A N/A Palpation: Wound Preparation: Ulcer Cleansing: Wound N/A N/A Cleanser Topical Anesthetic Applied: Other: Lidocaine 4% Ointment Treatment Notes Electronic Signature(s) Signed: 04/13/2015 4:29:34 PM By: Regan Lemming [name], RN Entered By: Regan Lemming on 04/13/2015 10:52:34 AYRA, HODGDON ([id number]) -------------------------------------------------------------------------------- Multi-Disciplinary Care Plan Details Patient Name: CHEKESHA, BEHLKE. Date of Service: 04/13/2015 10:45 AM Medical Record Number: [id number] Patient Account Number: [id number] Date of Birth/Sex: 1946/03/30 (69 y.o. Female) Treating  RN: Afful, RN, [name], Velva Harman Primary Care Physician: [name] Other Clinician: Referring Physician: [name] Treating Physician/Extender: BURNS III, Charlean Sanfilippo in Treatment: 5 Active Inactive Electronic Signature(s) Signed: 04/13/2015 4:29:34 PM By: Regan Lemming [name], RN Entered By: Regan Lemming on 04/13/2015 11:19:41 [name] ([id number]) -------------------------------------------------------------------------------- Pain Assessment Details Patient Name: ERINN, MENDOSA. Date of Service: 04/13/2015 10:45 AM Medical Record Number: [id number] Patient Account Number: [id number] Date of Birth/Sex: [birth year] (69 y.o. Female) Treating RN: Afful, RN, [name], Velva Harman Primary Care Physician: [name] Other Clinician: Referring Physician: [name] Treating Physician/Extender: BURNS III, Charlean Sanfilippo in Treatment: 5 Active Problems Location of Pain Severity and Description of Pain Patient Has Paino No Site Locations Pain Management and Medication Current Pain Management: Electronic Signature(s) Signed: 04/13/2015 4:29:34 PM By: Regan Lemming [name], RN Entered By: Regan Lemming on 04/13/2015 10:39:40 [name] ([id number]) -------------------------------------------------------------------------------- Patient/Caregiver Education Details Patient Name: GRECIA, LYNK. Date of Service: 04/13/2015 10:45 AM Medical Record Number: [id number] Patient Account Number: [id number] Date of Birth/Gender: 05-Nov-1946 (69 y.o. Female) Treating RN: Afful, RN, [name], Velva Harman Primary Care Physician: [name] Other  Clinician: Referring Physician: [name] Treating Physician/Extender: BURNS III, Charlean Sanfilippo in Treatment: 5 Education Assessment Education Provided To: Patient Education Topics Provided Basic Hygiene: Methods: Explain/Verbal Responses: State content correctly Electronic Signature(s) Signed: 04/13/2015 4:29:34 PM By: Regan Lemming [name], RN Entered By:  Regan Lemming on 04/13/2015 11:21:51 SHAYLA, HEMING ([id number]) -------------------------------------------------------------------------------- Wound Assessment Details Patient Name: ABAGAEL, KRAMM. Date of Service: 04/13/2015 10:45 AM Medical Record Number: [id number] Patient Account Number: [id number] Date of Birth/Sex: 08/07/1946 (69 y.o. Female) Treating RN: Afful, RN, [name], Velva Harman Primary Care Physician: [name] Other Clinician: Referring Physician: [name] Treating Physician/Extender: BURNS III, WALTER Weeks in Treatment: 5 Wound Status Wound Number: 1 Primary Venous Leg Ulcer Etiology: Wound Location: Left, Lateral Lower Leg Wound Healed - Epithelialized Wounding Event: Gradually Appeared Status: Date Acquired: 11/05/2014 Comorbid Cataracts, Asthma, Sleep Apnea, Deep Weeks Of Treatment: 5 History: Vein Thrombosis, Hypertension, Clustered Wound: No Peripheral Venous Disease, Osteoarthritis, Received Chemotherapy, Received Radiation Wound Measurements Length: (cm) 0 % Reduction i Width: (cm) 0 % Reduction i Depth: (cm) 0 Epithelializa Area: (cm) 0 Tunneling: Volume: (cm) 0 n Area: 100% n Volume: 100% tion: Large (67-100%) No Wound Description Full Thickness Without Exposed Classification: Support Structures Wound Margin: Indistinct, nonvisible Exudate Small Amount: Exudate Type: Serous Exudate Color: amber Foul Odor After Cleansing: No Wound Bed Granulation Amount: Large (67-100%) Exposed Structure Granulation Quality: Pink Fascia Exposed: No Necrotic Amount: None Present (0%) Fat Layer Exposed: No Tendon Exposed: No Muscle Exposed: No Joint Exposed: No Bone Exposed: No Limited to Skin Breakdown Periwound Skin Texture Texture Color No Abnormalities Noted: No No Abnormalities Noted: No HARLEIGH, CIVELLO. ([id number]) Callus: No Atrophie Blanche: No Crepitus: No Cyanosis: No Excoriation: No Ecchymosis: No Fluctuance:  No Erythema: No Friable: No Hemosiderin Staining: No Induration: No Mottled: No Localized Edema: Yes Pallor: No Rash: No Rubor: No Scarring: No Temperature / Pain Moisture Temperature: No Abnormality No Abnormalities Noted: No Tenderness on Palpation: Yes Dry / Scaly: Yes Maceration: No Moist: No Wound Preparation Ulcer Cleansing: Wound Cleanser Topical Anesthetic Applied: Other: Lidocaine 4% Ointment , Electronic Signature(s) Signed: 04/13/2015 4:29:34 PM By: Regan Lemming [name], RN Entered By: Regan Lemming on 04/13/2015 11:09:35 AELIANA, SPATES ([id number]) -------------------------------------------------------------------------------- Vitals Details Patient Name: LAURALYN, SHADOWENS. Date of Service: 04/13/2015 10:45 AM Medical Record Number: [id number] Patient Account Number: [id number] Date of Birth/Sex: 01-30-1946 (69 y.o. Female) Treating RN: Afful, RN, [name], Velva Harman Primary Care Physician: [name] Other Clinician: Referring Physician: [name] Treating Physician/Extender: BURNS III, WALTER Weeks in Treatment: 5 Vital Signs Time Taken: 10:39 Temperature (F): 98.1 Height (in): 63 Pulse (bpm): 63 Weight (lbs): 214.7 Respiratory Rate (breaths/min): 16 Body Mass Index (BMI): 38 Blood Pressure (mmHg): 130/72 Reference Range: 80 - 120 mg / dl Electronic Signature(s) Signed: 04/13/2015 4:29:34 PM By: Regan Lemming [name], RN Entered By: Regan Lemming on 04/13/2015 10:45:44

## 2015-04-14 ENCOUNTER — Encounter: Payer: Self-pay | Admitting: Family Medicine

## 2015-04-14 ENCOUNTER — Ambulatory Visit (INDEPENDENT_AMBULATORY_CARE_PROVIDER_SITE_OTHER): Payer: Medicare Other | Admitting: Family Medicine

## 2015-04-14 VITALS — BP 120/80 | HR 65 | Temp 97.9°F | Ht 62.0 in | Wt 214.5 lb

## 2015-04-14 DIAGNOSIS — I83224 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 of heel and midfoot and inflammation: Secondary | ICD-10-CM

## 2015-04-14 DIAGNOSIS — I83223 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 of ankle and inflammation: Secondary | ICD-10-CM | POA: Diagnosis not present

## 2015-04-14 DIAGNOSIS — I83229 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 of unspecified site and inflammation: Secondary | ICD-10-CM

## 2015-04-14 DIAGNOSIS — I83228 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 of other part of lower extremity and inflammation: Secondary | ICD-10-CM

## 2015-04-14 DIAGNOSIS — I83225 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 other part of foot and inflammation: Secondary | ICD-10-CM

## 2015-04-14 DIAGNOSIS — I83221 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 of thigh and inflammation: Secondary | ICD-10-CM

## 2015-04-14 DIAGNOSIS — I83023 Varicose veins of left lower extremity with ulcer of ankle: Secondary | ICD-10-CM

## 2015-04-14 DIAGNOSIS — I83222 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 of calf and inflammation: Secondary | ICD-10-CM | POA: Diagnosis not present

## 2015-04-14 DIAGNOSIS — L97329 Non-pressure chronic ulcer of left ankle with unspecified severity: Principal | ICD-10-CM

## 2015-04-14 NOTE — Progress Notes (Signed)
Pre visit review using our clinic review tool, if applicable. No additional management support is needed unless otherwise documented below in the visit note. 

## 2015-04-14 NOTE — Progress Notes (Signed)
Dr. Frederico Hamman T. Oney Folz, MD, [facility] and Sports Medicine [place], [address] Phone: [phone] Fax: [phone]  04/14/2015  Patient: [name], MRN: [id number], DOB: Jul 04, 1946, 69 y.o.  Primary Physician:  [name], MD  Chief Complaint: Ankle Wound and Foot Tenderness  Subjective:   [name] is a 69 y.o. very pleasant female patient who presents with the following:  Wound center x 6 weeks, vein surgery in April and again in July. Started to 4 cm - he had her in an Haematologist.  Nurse put on a Juxtalite lower leg, veins poking all out.  Took it off and left it off.  Some pain, ulcer remains healed.   Past Medical History, Surgical History, Social History, Family History, Problem List, Medications, and Allergies have been reviewed and updated if relevant.  Patient Active Problem List   Diagnosis Date Noted  . Venous stasis ulcer of ankle 11/04/2014  . Advanced care planning/counseling discussion 09/22/2014  . Medicare annual wellness visit, subsequent 05/15/2013  . Allergic rhinitis 04/03/2013  . Unspecified vitamin D deficiency 03/18/2013  . Hypertension   . Asthma in adult   . Chronic venous insufficiency   . Arthritis   . OSA (obstructive sleep apnea)   . RLS (restless legs syndrome)   . Melanoma     Past Medical History  Diagnosis Date  . Hypertension   . DVT (deep venous thrombosis) 2011    small, developed after venous ablation  . Tinnitus   . Asthma in adult   . Chronic venous insufficiency 2016    severe reflux with painful varicosities sees [facility]  . Melanoma 06/29/10    MALIGNANT MELANOMA R SHOULDER/SUPRASCAPULAR BACK s/p interferon chemo and XRT  . History of radiation therapy 07/19/11-08/25/2011    RIGHT AXILLARY REGION/METASTATIC  . History of kidney stones 1980s  . Neuromuscular disorder     muscle spasms  . Arthritis   . Seasonal allergies   . OSA (obstructive sleep apnea)     on CPAP  .  RLS (restless legs syndrome)   . Anesthesia complication     trouble waking up 2/2 CPAP  . Unspecified vitamin D deficiency 03/18/2013  . Hearing loss sensory, bilateral 2013    mod-severe high freq sensorineural ([facility])  . Cervical spondylosis 2012    Jacelyn Grip)    Past Surgical History  Procedure Laterality Date  . Venous ablation  2010    left leg  . Lumbar laminectomy  2001    L4/5  . Deep axillary sentinel node biopsy / excision  2012    RIGHT  . Portacath placement      left subclavian  . Breast surgery Right 1990    BX   . Dilation and curettage of uterus  2010  . Port-a-cath removal  12/05/2011    Procedure: REMOVAL PORT-A-CATH;  Surgeon: Rolm Bookbinder, MD;  Location: [facility];  Service: General;  Laterality: N/A;  left port removal  . Breast biopsy Left 2013    BENIGN  . Knee surgery  1985    left  . Melanoma excision  2011    Right shoulder, with sentinel lymph node biopsy  . Dexa  12/2009    WNL  . Cardiovascular stress test  2010    normal stress test, EF 66%  . Burnard Bunting  2016    WNL    History   Social History  . Marital Status: Divorced  Spouse Name: N/A  . Number of Children: 0  . Years of Education: N/A   Occupational History  . retired     Government social research officer    Social History Main Topics  . Smoking status: Former Smoker    Types: Cigarettes    Quit date: 11/05/1965  . Smokeless tobacco: Never Used     Comment: socially as a teen  . Alcohol Use: 0.0 oz/week    0 Standard drinks or equivalent per week     Comment: drinks wine occasionally  . Drug Use: No  . Sexual Activity: Not on file   Other Topics Concern  . Not on file   Social History Narrative   Lives alone.  Sister and husband live next door   Lives in [place].   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   Edu: some college   Diet: poor - good water, rare fruits/vegetables    Family History  Problem Relation Age of Onset  . Cancer  Mother 47    uterine  . Cancer Cousin     x2, breast  . CAD Father 28    MI  . Hypertension Father 68  . Diabetes Sister   . Cancer Paternal Grandmother     melanoma, possibly  . Alzheimer's disease Father 16    Allergies  Allergen Reactions  . Sulfa Drugs Cross Reactors Swelling    Sulfa medications-swelling of face    Medication list reviewed and updated in full in [place].   GEN: No acute illnesses, no fevers, chills. GI: No n/v/d, eating normally Pulm: No SOB Interactive and getting along well at home.  Otherwise, ROS is as per the HPI.  Objective:   BP 120/80 mmHg  Pulse 65  Temp(Src) 97.9 F (36.6 C) (Oral)  Ht 5\' 2"  (1.575 m)  Wt 214 lb 8 oz (97.297 kg)  BMI 39.22 kg/m2  GEN: WDWN, NAD, Non-toxic, A & O x 3 HEENT: Atraumatic, Normocephalic. Neck supple. No masses, No LAD. Ears and Nose: No external deformity. EXTR: No c/c/tr edema. Lateral ulcer remains closed. NEURO Normal gait.  PSYCH: Normally interactive. Conversant. Not depressed or anxious appearing.  Calm demeanor.   Laboratory and Imaging Data:  Assessment and Plan:   Venous stasis ulcer of ankle, left  Examined with primary care provider. Place the patient in 10-20 mmHg compression stockings. Discontinue the current compression devices she has. Follow-up when necessary  Signed,  Madicyn Mesina T. Lucretia Pendley, MD   Patient's Medications  New Prescriptions   No medications on file  Previous Medications   ACETAMINOPHEN (TYLENOL ARTHRITIS EXT RELIEF PO)    Take 1 tablet by mouth daily as needed.   ASPIRIN 81 MG TABLET    Take 81 mg by mouth daily.   FUROSEMIDE (LASIX) 20 MG TABLET    Take 1 tablet (20 mg total) by mouth daily as needed for fluid.   KLOR-CON M10 10 MEQ TABLET    TAKE 2 TABLETS BY MOUTH DAILY.   LOSARTAN-HYDROCHLOROTHIAZIDE (HYZAAR) 50-12.5 MG PER TABLET    TAKE 1 TABLET BY MOUTH DAILY.   MONTELUKAST (SINGULAIR) 10 MG TABLET    TAKE 1 TABLET BY MOUTH DAILY   MULTIPLE  VITAMINS-MINERALS (MULTIVITAMIN PO)    Take by mouth every morning.   PROAIR HFA 108 (90 BASE) MCG/ACT INHALER    INHALE 2 PUFFS INTO LUNGS EVERY 6 HOURS AS NEEDED FOR WHEEZING OR SHORTNESS OF BREATH   PROBIOTIC PRODUCT (PROBIOTIC DAILY PO)  Take by mouth every morning.   VITAMIN D, ERGOCALCIFEROL, (DRISDOL) 50000 UNITS CAPS CAPSULE    Take 1 capsule (50,000 Units total) by mouth every 7 (seven) days.  Modified Medications   No medications on file  Discontinued Medications   No medications on file

## 2015-04-22 ENCOUNTER — Encounter: Payer: Self-pay | Admitting: Family Medicine

## 2015-04-22 ENCOUNTER — Other Ambulatory Visit: Payer: Self-pay | Admitting: Family Medicine

## 2015-05-02 ENCOUNTER — Encounter: Payer: Self-pay | Admitting: Podiatry

## 2015-05-02 ENCOUNTER — Ambulatory Visit (INDEPENDENT_AMBULATORY_CARE_PROVIDER_SITE_OTHER): Payer: Medicare Other | Admitting: Podiatry

## 2015-05-02 VITALS — BP 149/82 | HR 66 | Resp 16

## 2015-05-02 DIAGNOSIS — M79606 Pain in leg, unspecified: Secondary | ICD-10-CM

## 2015-05-02 DIAGNOSIS — I872 Venous insufficiency (chronic) (peripheral): Secondary | ICD-10-CM | POA: Diagnosis not present

## 2015-05-02 DIAGNOSIS — B351 Tinea unguium: Secondary | ICD-10-CM

## 2015-05-02 DIAGNOSIS — M79673 Pain in unspecified foot: Secondary | ICD-10-CM

## 2015-05-02 NOTE — Progress Notes (Signed)
   Subjective:    Patient ID: [name], female    DOB: January 12, 1946, 69 y.o.   MRN: [id number]  HPI Comments: "I need the toenails looked at and the skin is extremely sensitive."  Patient states that she had an ulcer lateral left leg that has healed but the area itself is sore still. Also, c/o that the skin and the feet and legs in general are extremely sensitive to touch and put socks or stockings on. The toenails need to be cut as well.      Review of Systems  HENT: Positive for tinnitus.   Respiratory: Positive for apnea.   Cardiovascular: Positive for leg swelling.  All other systems reviewed and are negative.      Objective:   Physical Exam: I have reviewed her past mental history medications allergies surgery social history and review of systems. Pulses are palpable bilateral. Neurologic sensorium is intact bilateral. Deep tendon reflexes intact bilateral. Muscle strength symmetrical and normal bilateral. Venous insufficiency resulting in multiple varicosities around the ankle and edema bilateral. History of previous venous insufficiency and ulceration. Her nails are thick yellow dystrophic onychomycotic and painful palpation as well as debridement.        Assessment & Plan:  Assessment pain in limb secondary to onychomycosis and neuropathic changes associated with venous insufficiency.  Plan: Debrided nails 1 through 5 bilateral. Discussed oral therapy and she declined.

## 2015-05-04 ENCOUNTER — Other Ambulatory Visit: Payer: Self-pay | Admitting: Family Medicine

## 2015-05-05 ENCOUNTER — Encounter: Payer: Self-pay | Admitting: Pulmonary Disease

## 2015-05-05 ENCOUNTER — Ambulatory Visit (INDEPENDENT_AMBULATORY_CARE_PROVIDER_SITE_OTHER): Payer: Medicare Other | Admitting: Pulmonary Disease

## 2015-05-05 VITALS — BP 130/85 | HR 73 | Ht 63.0 in | Wt 213.0 lb

## 2015-05-05 DIAGNOSIS — G4733 Obstructive sleep apnea (adult) (pediatric): Secondary | ICD-10-CM

## 2015-05-05 DIAGNOSIS — J4520 Mild intermittent asthma, uncomplicated: Secondary | ICD-10-CM | POA: Diagnosis not present

## 2015-05-05 NOTE — Assessment & Plan Note (Signed)
Call us once you have used new CPAP x 1 month & we can check download  Weight loss encouraged, compliance with goal of at least 4-6 hrs every night is the expectation. Advised against medications with sedative side effects Cautioned against driving when sleepy - understanding that sleepiness will vary on a day to day basis

## 2015-05-05 NOTE — Progress Notes (Signed)
   Subjective:    Patient ID: [name], female    DOB: 1946-07-20, 69 y.o.   MRN: [id number]  HPI  69 year old  woman for followup of severe OSA   Moved from [place] 2010 - she was diagnosed with asthma in her 67s and took Advair for a long period of time.  Her sleep pattern was severely disrupted due to treatment for melanoma from 2011-2013 requiring interferon therapy. She has Cervical disc disease and has required Flexeril for muscle spasms.  She took neurotin for RLS for years and then it became ineffective.  Acute OV 03/24/14 for wheezing/ dyspnea - treated as allergies   Significant tests/ events  PSG 2003 which showed AHI of 81 events per hour and lowest desaturation to 57%. This was corrected by CPAP of 12 cm in with residual events of 5.6 events per hour on a study in 2007. CPAP 11/05/13 to 03/24/14 >> used on 140 of 140 nights with average 4 hrs and 36 min. Average AHI is 6.5 with median CPAP 12 cm H2O.   PFTs 04/2014- no airway obs, ratio ok, FEV1 80%, TLC & DLCO nml   05/05/2015  Chief Complaint  Patient presents with  . Follow-up    breathing is doing well. patient uses CPAP every night, just received new machine, no download available. Pt says she is sleeping 6-7 hours on CPAP nightly.  Also uses CPAP during day if she takes nap.     No wheezing or nocturnal symptoms or dyspnea-she is off Advair now and only on Singulair Compliant with cpap - nasal pillows Mask okay, pressure okay She has obtained a new CPAP-but has been sleeping in her living room hence has not started using this yet and is still using her old CPAP She is struggling with varicose veins and venous ulcers, now wearing compression hose-and has follow-up with wound care   Review of Systems neg for any significant sore throat, dysphagia, itching, sneezing, nasal congestion or excess/ purulent secretions, fever, chills, sweats, unintended wt loss, pleuritic or exertional cp, hempoptysis, orthopnea pnd or change  in chronic leg swelling. Also denies presyncope, palpitations, heartburn, abdominal pain, nausea, vomiting, diarrhea or change in bowel or urinary habits, dysuria,hematuria, rash, arthralgias, visual complaints, headache, numbness weakness or ataxia.     Objective:   Physical Exam  Gen. Pleasant, obese, in no distress ENT - no lesions, no post nasal drip Neck: No JVD, no thyromegaly, no carotid bruits Lungs: no use of accessory muscles, no dullness to percussion, decreased without rales or rhonchi  Cardiovascular: Rhythm regular, heart sounds  normal, no murmurs or gallops, no peripheral edema Musculoskeletal: No deformities, no cyanosis or clubbing , no tremors       Assessment & Plan:

## 2015-05-05 NOTE — Patient Instructions (Signed)
Call us once you have used new CPAP x 1 month

## 2015-05-05 NOTE — Assessment & Plan Note (Signed)
Favor allergies -stable on singulair Zyrtec seasonal Albuterol prn

## 2015-05-07 ENCOUNTER — Other Ambulatory Visit: Payer: Self-pay | Admitting: Family Medicine

## 2015-05-10 ENCOUNTER — Encounter: Payer: Self-pay | Admitting: *Deleted

## 2015-05-20 ENCOUNTER — Encounter: Payer: Self-pay | Admitting: Vascular Surgery

## 2015-05-24 ENCOUNTER — Encounter: Payer: Self-pay | Admitting: Vascular Surgery

## 2015-05-24 ENCOUNTER — Ambulatory Visit (INDEPENDENT_AMBULATORY_CARE_PROVIDER_SITE_OTHER): Payer: Medicare Other | Admitting: Internal Medicine

## 2015-05-24 ENCOUNTER — Encounter: Payer: Self-pay | Admitting: Internal Medicine

## 2015-05-24 ENCOUNTER — Ambulatory Visit (INDEPENDENT_AMBULATORY_CARE_PROVIDER_SITE_OTHER): Payer: Medicare Other | Admitting: Vascular Surgery

## 2015-05-24 VITALS — BP 140/78 | HR 65 | Temp 98.7°F | Resp 16 | Ht 63.0 in | Wt 212.0 lb

## 2015-05-24 VITALS — BP 120/80 | HR 64 | Temp 97.8°F | Wt 211.8 lb

## 2015-05-24 DIAGNOSIS — T148 Other injury of unspecified body region: Secondary | ICD-10-CM

## 2015-05-24 DIAGNOSIS — I83893 Varicose veins of bilateral lower extremities with other complications: Secondary | ICD-10-CM | POA: Insufficient documentation

## 2015-05-24 DIAGNOSIS — I83892 Varicose veins of left lower extremities with other complications: Secondary | ICD-10-CM

## 2015-05-24 DIAGNOSIS — M545 Low back pain, unspecified: Secondary | ICD-10-CM

## 2015-05-24 DIAGNOSIS — T148XXA Other injury of unspecified body region, initial encounter: Secondary | ICD-10-CM

## 2015-05-24 NOTE — Progress Notes (Signed)
Subjective:     Patient ID: [name], female   DOB: 1946-10-23, 69 y.o.   MRN: [id number]  HPI this 69 year old female he turns 3 months post laser ablation left great saphenous vein for gross reflux in the great saphenous system which resulted in stasis ulcer left ankle and painful varicosities. The ulcer healed 6 months following the procedure after weekly trips to the wound center at [facility]. It remains healed. She continues to have painful bulge is beginning in the distal thigh extending lateral to the knee and into the lateral calf.  Itching and itching discomfort in the prominent reticular veins and the ankle with chronic 1+ edema. She has no history of DVT or thrombophlebitis. She continues to wear elastic compression stockings without relief.  Past Medical History  Diagnosis Date  . Hypertension   . DVT (deep venous thrombosis) 2011    small, developed after venous ablation  . Tinnitus   . Asthma in adult   . Chronic venous insufficiency 2016    severe reflux with painful varicosities sees [facility]  . Melanoma 06/29/10    MALIGNANT MELANOMA R SHOULDER/SUPRASCAPULAR BACK s/p interferon chemo and XRT  . History of radiation therapy 07/19/11-08/25/2011    RIGHT AXILLARY REGION/METASTATIC  . History of kidney stones 1980s  . Neuromuscular disorder     muscle spasms  . Arthritis   . Seasonal allergies   . OSA (obstructive sleep apnea)     on CPAP  . RLS (restless legs syndrome)   . Anesthesia complication     trouble waking up 2/2 CPAP  . Unspecified vitamin D deficiency 03/18/2013  . Hearing loss sensory, bilateral 2013    mod-severe high freq sensorineural ([facility])  . Cervical spondylosis 2012    Jacelyn Grip)    History  Substance Use Topics  . Smoking status: Former Smoker    Types: Cigarettes    Quit date: 11/05/1965  . Smokeless tobacco: Never Used     Comment: socially as a teen  . Alcohol Use: 0.0 oz/week    0 Standard drinks or equivalent per week      Comment: drinks wine occasionally    Family History  Problem Relation Age of Onset  . Cancer Mother 37    uterine  . Cancer Cousin     x2, breast  . CAD Father 35    MI  . Hypertension Father 75  . Diabetes Sister   . Cancer Paternal Grandmother     melanoma, possibly  . Alzheimer's disease Father 67    Allergies  Allergen Reactions  . Sulfa Drugs Cross Reactors Swelling    Sulfa medications-swelling of face     Current outpatient prescriptions:  .  Acetaminophen (TYLENOL ARTHRITIS EXT RELIEF PO), Take 1 tablet by mouth daily as needed., Disp: , Rfl:  .  aspirin 81 MG tablet, Take 81 mg by mouth daily., Disp: , Rfl:  .  furosemide (LASIX) 20 MG tablet, Take 1 tablet (20 mg total) by mouth daily as needed for fluid. (Patient taking differently: Take 20 mg by mouth every other day. ), Disp: 30 tablet, Rfl: 3 .  KLOR-CON M10 10 MEQ tablet, TAKE 2 TABLETS BY MOUTH DAILY., Disp: 90 tablet, Rfl: 3 .  losartan-hydrochlorothiazide (HYZAAR) 50-12.5 MG per tablet, TAKE 1 TABLET BY MOUTH DAILY., Disp: 30 tablet, Rfl: 6 .  losartan-hydrochlorothiazide (HYZAAR) 50-12.5 MG per tablet, TAKE 1 TABLET BY MOUTH DAILY., Disp: 30 tablet, Rfl: 3 .  montelukast (SINGULAIR) 10 MG tablet,  TAKE 1 TABLET BY MOUTH DAILY, Disp: 30 tablet, Rfl: 11 .  Multiple Vitamins-Minerals (MULTIVITAMIN PO), Take by mouth every morning., Disp: , Rfl:  .  PROAIR HFA 108 (90 BASE) MCG/ACT inhaler, INHALE 2 PUFFS INTO LUNGS EVERY 6 HOURS AS NEEDED FOR WHEEZING OR SHORTNESS OF BREATH, Disp: 8.5 Inhaler, Rfl: 3 .  Probiotic Product (PROBIOTIC DAILY PO), Take by mouth every morning., Disp: , Rfl:  .  Vitamin D, Ergocalciferol, (DRISDOL) 50000 UNITS CAPS capsule, TAKE 1 CAPSULE (50,000 UNITS TOTAL) BY MOUTH EVERY 7 (SEVEN) DAYS., Disp: 4 capsule, Rfl: 5  Filed Vitals:   05/24/15 1024 05/24/15 1026  BP: 147/78 140/78  Pulse: 65   Temp: 98.7 F (37.1 C)   Resp: 16   Height: 5\' 3"  (1.6 m)   Weight: 212 lb (96.163 kg)      Body mass index is 37.56 kg/(m^2).           Review of Systems Denies chest pain, dyspnea on exertion, PND, orthopnea, hemoptysis     Objective:   Physical Exam BP 140/78 mmHg  Pulse 65  Temp(Src) 98.7 F (37.1 C)  Resp 16  Ht 5\' 3"  (1.6 m)  Wt 212 lb (96.163 kg)  BMI 37.56 kg/m2   Gen. [name] well-nourished female in no apparent distress alert and oriented 3  lungs no rhonchi or wheezing  left leg with bulging varicosities beginning in the anterior distal thigh extending laterally around knee and into lateral calf with extensive network of reticular veins around medial and lateral malleolar area. Hyperpigmentation lower third of leg. Chronic 1+ edema. Healed ulceration distal lateral lower leg. 3+ dorsalis pedis pulses palpable.     Assessment:      doing well with complete healing of ulceration 3 months post laser ablation left great saphenous vein for gross reflux causing ulcer and painful varicosities  varicosities remain symptomatic despite long leg elastic compression stockings.     Plan:      patient needs #1 stab phlebectomy-greater than 20 of painful varicosities left thigh and calf followed by #2 2 courses of sclerotherapy  Will proceed with precertification to perform this in the near future

## 2015-05-24 NOTE — Progress Notes (Signed)
Pre visit review using our clinic review tool, if applicable. No additional management support is needed unless otherwise documented below in the visit note. 

## 2015-05-24 NOTE — Progress Notes (Signed)
Subjective:    Patient ID: [name], female    DOB: [birth year], 69 y.o.   MRN: [id number]  HPI  Pt presents to the clinic today with c/o right hip pain. This started 2 days ago. The pain radiates to her right groin/thigh. She reports it feels tight and like it is pulling. She does feel like she is a little off balance. She denies numbness or tingling in her right leg/foot.he denies any injury to the area but reports she has been moving some heavy boxes recently. She denies urinary or vaginal complaints. 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 She does have a history of arthritis in her neck, she takes Tylenol for pain.  Review of Systems      Past Medical History  Diagnosis Date  . Hypertension   . DVT (deep venous thrombosis) 2011    small, developed after venous ablation  . Tinnitus   . Asthma in adult   . Chronic venous insufficiency 2016    severe reflux with painful varicosities sees [facility]  . Melanoma 06/29/10    MALIGNANT MELANOMA R SHOULDER/SUPRASCAPULAR BACK s/p interferon chemo and XRT  . History of radiation therapy 07/19/11-08/25/2011    RIGHT AXILLARY REGION/METASTATIC  . History of kidney stones 1980s  . Neuromuscular disorder     muscle spasms  . Arthritis   . Seasonal allergies   . OSA (obstructive sleep apnea)     on CPAP  . RLS (restless legs syndrome)   . Anesthesia complication     trouble waking up 2/2 CPAP  . Unspecified vitamin D deficiency 03/18/2013  . Hearing loss sensory, bilateral 2013    mod-severe high freq sensorineural ([facility])  . Cervical spondylosis 2012    [facility] Grip)    Current Outpatient Prescriptions  Medication Sig Dispense Refill  . Acetaminophen (TYLENOL ARTHRITIS EXT RELIEF PO) Take 1 tablet by mouth daily as needed.    Marland Kitchen aspirin 81 MG tablet Take 81 mg by mouth daily.    . furosemide (LASIX) 20 MG tablet Take 1 tablet (20 mg total) by mouth daily as  needed for fluid. (Patient taking differently: Take 20 mg by mouth every other day. ) 30 tablet 3  . KLOR-CON M10 10 MEQ tablet TAKE 2 TABLETS BY MOUTH DAILY. 90 tablet 3  . losartan-hydrochlorothiazide (HYZAAR) 50-12.5 MG per tablet TAKE 1 TABLET BY MOUTH DAILY. 30 tablet 6  . losartan-hydrochlorothiazide (HYZAAR) 50-12.5 MG per tablet TAKE 1 TABLET BY MOUTH DAILY. 30 tablet 3  . montelukast (SINGULAIR) 10 MG tablet TAKE 1 TABLET BY MOUTH DAILY 30 tablet 11  . Multiple Vitamins-Minerals (MULTIVITAMIN PO) Take by mouth every morning.    Marland Kitchen PROAIR HFA 108 (90 BASE) MCG/ACT inhaler INHALE 2 PUFFS INTO LUNGS EVERY 6 HOURS AS NEEDED FOR WHEEZING OR SHORTNESS OF BREATH 8.5 Inhaler 3  . Probiotic Product (PROBIOTIC DAILY PO) Take by mouth every morning.    . Vitamin D, Ergocalciferol, (DRISDOL) 50000 UNITS CAPS capsule TAKE 1 CAPSULE (50,000 UNITS TOTAL) BY MOUTH EVERY 7 (SEVEN) DAYS. 4 capsule 5   No current facility-administered medications for this visit.    Allergies  Allergen Reactions  . Sulfa Drugs Cross Reactors Swelling    Sulfa medications-swelling of face    Family History  Problem Relation Age of Onset  . Cancer Mother 83    uterine  . Cancer Cousin  x2, breast  . CAD Father 47    MI  . Hypertension Father 45  . Diabetes Sister   . Cancer Paternal Grandmother     melanoma, possibly  . Alzheimer's disease Father 35    History   Social History  . Marital Status: Divorced    Spouse Name: N/A  . Number of Children: 0  . Years of Education: N/A   Occupational History  . retired     Government social research officer    Social History Main Topics  . Smoking status: Former Smoker    Types: Cigarettes    Quit date: 11/05/1965  . Smokeless tobacco: Never Used     Comment: socially as a teen  . Alcohol Use: 0.0 oz/week    0 Standard drinks or equivalent per week     Comment: drinks wine occasionally  . Drug Use: No  . Sexual Activity: Not on file   Other Topics Concern  . Not on  file   Social History Narrative   Lives alone.  Sister and husband live next door   Lives in [place].   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   Edu: some college   Diet: poor - good water, rare fruits/vegetables     Constitutional: Denies fever, malaise, fatigue, headache or abrupt weight changes.  Respiratory: Denies difficulty breathing, shortness of breath, cough or sputum production.   Cardiovascular: Denies chest pain, chest tightness, palpitations or swelling in the hands or feet.  Gastrointestinal: Denies abdominal pain, bloating, constipation, diarrhea or blood in the stool.  GU: Denies urgency, frequency, pain with urination, burning sensation, blood in urine, odor or discharge. Musculoskeletal: Pt reports right hip/leg pain. Denies difficulty with gait, or joint swelling.  Skin: Denies redness, rashes, lesions or ulcercations.  Neurological: Denies dizziness, difficulty with memory, difficulty with speech or problems with balance and coordination.   No other specific complaints in a complete review of systems (except as listed in HPI above).  Objective:   Physical Exam  BP 120/80 mmHg  Pulse 64  Temp(Src) 97.8 F (36.6 C) (Oral)  Wt 211 lb 12.8 oz (96.072 kg)  SpO2 98% Wt Readings from Last 3 Encounters:  05/24/15 211 lb 12.8 oz (96.072 kg)  05/24/15 212 lb (96.163 kg)  05/05/15 213 lb (96.616 kg)    General: Appears her stated age, obese in NAD. Cardiovascular: Normal rate and rhythm. S1,S2 noted.   Pulmonary/Chest: Normal effort and positive vesicular breath sounds. No respiratory distress. No wheezes, rales or ronchi noted.  Musculoskeletal: Normal extension of the spine. Pain with flexion and rotation to the left. Pain with internal rotation of the right hip but normal external rotation. No pain with palpation over the right trochanter. Strength 5/5 BLE. No difficulty with gait.  Neurological: Alert and oriented.   BMET    Component  Value Date/Time   NA 143 03/18/2015 1000   NA 147* 09/17/2014 1013   NA 146 10/21/2012   K 3.8 03/18/2015 1000   K 3.8 09/17/2014 1013   CL 105 03/18/2015 1000   CL 104 09/17/2014 1013   CO2 26 03/18/2015 1000   CO2 27 09/17/2014 1013   GLUCOSE 108* 03/18/2015 1000   GLUCOSE 94 09/17/2014 1013   BUN 17 03/18/2015 1000   BUN 21 09/17/2014 1013   BUN 18 10/21/2012   CREATININE 0.80 03/18/2015 1000   CREATININE 1.0 09/17/2014 1013   CALCIUM 9.3 03/18/2015 1000   CALCIUM 9.3 09/17/2014 1013  GFRNONAA >60 11/07/2010 1316   GFRAA  11/07/2010 1316    >60        The eGFR has been calculated using the MDRD equation. This calculation has not been validated in all clinical situations. eGFR's persistently <60 mL/min signify possible Chronic Kidney Disease.    Lipid Panel     Component Value Date/Time   CHOL 189 05/11/2013 1021   TRIG 73.0 05/11/2013 1021   TRIG 76 10/21/2012   HDL 61.30 05/11/2013 1021   CHOLHDL 3 05/11/2013 1021   VLDL 14.6 05/11/2013 1021   LDLCALC 113* 05/11/2013 1021    CBC    Component Value Date/Time   WBC 6.3 03/18/2015 1000   WBC 5.1 10/21/2012   RBC 4.34 03/18/2015 1000   RBC 4.42 11/07/2010 1316   HGB 13.2 03/18/2015 1000   HGB 12.8 10/21/2012   HGB 11.9* 12/05/2011 0920   HCT 40.0 03/18/2015 1000   HCT 35.0* 12/05/2011 0920   PLT 191 03/18/2015 1000   PLT 177 11/07/2010 1316   MCV 92 03/18/2015 1000   MCV 90.5 11/07/2010 1316   MCH 30.4 03/18/2015 1000   MCH 29.4 11/07/2010 1316   MCHC 33.0 03/18/2015 1000   MCHC 32.5 11/07/2010 1316   RDW 13.0 03/18/2015 1000   RDW 13.0 11/07/2010 1316   LYMPHSABS 1.5 03/18/2015 1000   LYMPHSABS 2.1 11/07/2010 1316   MONOABS 0.5 11/07/2010 1316   EOSABS 0.2 03/18/2015 1000   EOSABS 0.1 11/07/2010 1316   BASOSABS 0.0 03/18/2015 1000   BASOSABS 0.0 11/07/2010 1316    Hgb A1C No results found for: HGBA1C      Assessment & Plan:   Muscle strain of right lower back, extending into right  hip:  Start taking Aleve once daily in the place of Tylenol Stretching exercises given No heavy lifting x 1 week Reassured her that pain should resolve in a few weeks but if persist, she should follow up with PCP  RTC as needed or if symptoms persist or worsen

## 2015-05-24 NOTE — Patient Instructions (Signed)
Muscle Strain °A muscle strain is an injury that occurs when a muscle is stretched beyond its normal length. Usually a small number of muscle fibers are torn when this happens. Muscle strain is rated in degrees. First-degree strains have the least amount of muscle fiber tearing and pain. Second-degree and third-degree strains have increasingly more tearing and pain.  °Usually, recovery from muscle strain takes 1-2 weeks. Complete healing takes 5-6 weeks.  °CAUSES  °Muscle strain happens when a sudden, violent force placed on a muscle stretches it too far. This may occur with lifting, sports, or a fall.  °RISK FACTORS °Muscle strain is especially common in athletes.  °SIGNS AND SYMPTOMS °At the site of the muscle strain, there may be: °· Pain. °· Bruising. °· Swelling. °· Difficulty using the muscle due to pain or lack of normal function. °DIAGNOSIS  °Your health care provider will perform a physical exam and ask about your medical history. °TREATMENT  °Often, the best treatment for a muscle strain is resting, icing, and applying cold compresses to the injured area.   °HOME CARE INSTRUCTIONS  °· Use the PRICE method of treatment to promote muscle healing during the first 2-3 days after your injury. The PRICE method involves: °¨ Protecting the muscle from being injured again. °¨ Restricting your activity and resting the injured body part. °¨ Icing your injury. To do this, put ice in a plastic bag. Place a towel between your skin and the bag. Then, apply the ice and leave it on from 15-20 minutes each hour. After the third day, switch to moist heat packs. °¨ Apply compression to the injured area with a splint or elastic bandage. Be careful not to wrap it too tightly. This may interfere with blood circulation or increase swelling. °¨ Elevate the injured body part above the level of your heart as often as you can. °· Only take over-the-counter or prescription medicines for pain, discomfort, or fever as directed by your  health care provider. °· Warming up prior to exercise helps to prevent future muscle strains. °SEEK MEDICAL CARE IF:  °· You have increasing pain or swelling in the injured area. °· You have numbness, tingling, or a significant loss of strength in the injured area. °MAKE SURE YOU:  °· Understand these instructions. °· Will watch your condition. °· Will get help right away if you are not doing well or get worse. °Document Released: 10/22/2005 Document Revised: 08/12/2013 Document Reviewed: 05/21/2013 °[employer]® Patient Information ©2015 [employer]. This information is not intended to replace advice given to you by your health care provider. Make sure you discuss any questions you have with your health care provider. ° °

## 2015-05-24 NOTE — Progress Notes (Signed)
Filed Vitals:   05/24/15 1024 05/24/15 1026  BP: 147/78 140/78  Pulse: 65   Temp: 98.7 F (37.1 C)   Resp: 16   Height: 5\' 3"  (1.6 m)   Weight: 212 lb (96.163 kg)

## 2015-05-26 DIAGNOSIS — D2271 Melanocytic nevi of right lower limb, including hip: Secondary | ICD-10-CM | POA: Diagnosis not present

## 2015-05-26 DIAGNOSIS — Z85820 Personal history of malignant melanoma of skin: Secondary | ICD-10-CM | POA: Diagnosis not present

## 2015-05-26 DIAGNOSIS — D225 Melanocytic nevi of trunk: Secondary | ICD-10-CM | POA: Diagnosis not present

## 2015-05-26 DIAGNOSIS — D2261 Melanocytic nevi of right upper limb, including shoulder: Secondary | ICD-10-CM | POA: Diagnosis not present

## 2015-05-26 DIAGNOSIS — D1801 Hemangioma of skin and subcutaneous tissue: Secondary | ICD-10-CM | POA: Diagnosis not present

## 2015-05-26 DIAGNOSIS — D2272 Melanocytic nevi of left lower limb, including hip: Secondary | ICD-10-CM | POA: Diagnosis not present

## 2015-05-26 DIAGNOSIS — D2262 Melanocytic nevi of left upper limb, including shoulder: Secondary | ICD-10-CM | POA: Diagnosis not present

## 2015-05-26 DIAGNOSIS — L821 Other seborrheic keratosis: Secondary | ICD-10-CM | POA: Diagnosis not present

## 2015-05-26 DIAGNOSIS — Z85828 Personal history of other malignant neoplasm of skin: Secondary | ICD-10-CM | POA: Diagnosis not present

## 2015-06-24 ENCOUNTER — Encounter: Payer: Self-pay | Admitting: Family Medicine

## 2015-06-24 ENCOUNTER — Ambulatory Visit (INDEPENDENT_AMBULATORY_CARE_PROVIDER_SITE_OTHER): Payer: Medicare Other | Admitting: Family Medicine

## 2015-06-24 VITALS — BP 130/74 | HR 65 | Temp 97.5°F | Wt 212.5 lb

## 2015-06-24 DIAGNOSIS — Z01419 Encounter for gynecological examination (general) (routine) without abnormal findings: Secondary | ICD-10-CM

## 2015-06-24 DIAGNOSIS — I872 Venous insufficiency (chronic) (peripheral): Secondary | ICD-10-CM

## 2015-06-24 DIAGNOSIS — Z0000 Encounter for general adult medical examination without abnormal findings: Secondary | ICD-10-CM | POA: Diagnosis not present

## 2015-06-24 MED ORDER — GABAPENTIN 300 MG PO CAPS: 300.0000 mg | ORAL_CAPSULE | Freq: Two times a day (BID) | ORAL | Status: DC

## 2015-06-24 NOTE — Progress Notes (Signed)
BP 130/74 mmHg  Pulse 65  Temp(Src) 97.5 F (36.4 C) (Oral)  Wt 212 lb 8 oz (96.389 kg)  SpO2 97%   CC: check leg pain.  Subjective:    Patient ID: [name], female    DOB: 02-19-1946, 69 y.o.   MRN: [id number]  HPI: [name] is a 69 y.o. female presenting on 06/24/2015 for Leg Pain   H/o chronic venous insufficiency with severe reflux and painful varicosities as well as h/o venous ulcers - followed by [facility], s/p L endovenous ablation of accessory branch of great saphenous vein (2016)  Has upcoming phlebotomy end of September. Recommended knee or thigh high compression stockings for 2 weeks after surgery.   She saw podiatrist who diagnosed her with neuropathic arthritis. Pt presents today for my recommendations on upcoming surgery. Has questions about upcoming surgery.   No recent venous ulcer.   ABIs normal 2016.  Would like new OBGYN. Previous one moved to [place] [name])  Relevant past medical, surgical, family and social history reviewed and updated as indicated. Interim medical history since our last visit reviewed. Allergies and medications reviewed and updated. Current Outpatient Prescriptions on File Prior to Visit  Medication Sig  . Acetaminophen (TYLENOL ARTHRITIS EXT RELIEF PO) Take 1 tablet by mouth daily as needed.  Marland Kitchen aspirin 81 MG tablet Take 81 mg by mouth daily.  . furosemide (LASIX) 20 MG tablet Take 1 tablet (20 mg total) by mouth daily as needed for fluid. (Patient taking differently: Take 20 mg by mouth every other day. )  . KLOR-CON M10 10 MEQ tablet TAKE 2 TABLETS BY MOUTH DAILY.  Marland Kitchen losartan-hydrochlorothiazide (HYZAAR) 50-12.5 MG per tablet TAKE 1 TABLET BY MOUTH DAILY.  . montelukast (SINGULAIR) 10 MG tablet TAKE 1 TABLET BY MOUTH DAILY  . Multiple Vitamins-Minerals (MULTIVITAMIN PO) Take by mouth every morning.  Marland Kitchen PROAIR HFA 108 (90 BASE) MCG/ACT inhaler INHALE 2 PUFFS INTO LUNGS EVERY 6 HOURS AS NEEDED FOR WHEEZING OR SHORTNESS OF  BREATH  . Probiotic Product (PROBIOTIC DAILY PO) Take by mouth every morning.  . Vitamin D, Ergocalciferol, (DRISDOL) 50000 UNITS CAPS capsule TAKE 1 CAPSULE (50,000 UNITS TOTAL) BY MOUTH EVERY 7 (SEVEN) DAYS.   No current facility-administered medications on file prior to visit.    Review of Systems Per HPI unless specifically indicated above     Objective:    BP 130/74 mmHg  Pulse 65  Temp(Src) 97.5 F (36.4 C) (Oral)  Wt 212 lb 8 oz (96.389 kg)  SpO2 97%  Wt Readings from Last 3 Encounters:  06/24/15 212 lb 8 oz (96.389 kg)  05/24/15 211 lb 12.8 oz (96.072 kg)  05/24/15 212 lb (96.163 kg)    Physical Exam  Constitutional: She appears well-developed and well-nourished. No distress.  HENT:  Head: Normocephalic and atraumatic.  Musculoskeletal: She exhibits edema.  Mild pedal edema present Significant reticulated veins of ankles and varicose veins of lower legs. Tender to palpation BLE  Nursing note and vitals reviewed.  Results for orders placed or performed in visit on 04/08/15  HM MAMMOGRAPHY  Result Value Ref Range   HM Mammogram Normal Birads 2-Repeat 1 year       Assessment & Plan:   Problem List Items Addressed This Visit    Chronic venous insufficiency - Primary    With ?of neuropathic arthritis - but I think her symptoms are more venous insufficiency related. Reasonable to trial gabapentin for next month prior to upcoming phlebotomy then assess  for effect. If leg pain resolved, may consider canceling surgery, but if persistent pain recommend f/u with [facility].       Other Visit Diagnoses    Well woman exam        Relevant Orders    Ambulatory referral to Gynecology        Follow up plan: Return if symptoms worsen or fail to improve.

## 2015-06-24 NOTE — Progress Notes (Signed)
Pre visit review using our clinic review tool, if applicable. No additional management support is needed unless otherwise documented below in the visit note. 

## 2015-06-24 NOTE — Assessment & Plan Note (Signed)
With ?of neuropathic arthritis - but I think her symptoms are more venous insufficiency related. Reasonable to trial gabapentin for next month prior to upcoming phlebotomy then assess for effect. If leg pain resolved, may consider canceling surgery, but if persistent pain recommend f/u with [facility].

## 2015-06-24 NOTE — Patient Instructions (Signed)
I think leg pain is more venous insufficiency related.  I think it's reasonable to try gabapentin 300mg  for possible nerve pain and then assessing for effect. Start at 300mg  nightly for 1 week then increase to twice daily. Let us know how you do with gabapentin.

## 2015-07-21 DIAGNOSIS — M542 Cervicalgia: Secondary | ICD-10-CM | POA: Diagnosis not present

## 2015-07-21 DIAGNOSIS — Z6838 Body mass index (BMI) 38.0-38.9, adult: Secondary | ICD-10-CM | POA: Diagnosis not present

## 2015-07-21 DIAGNOSIS — M4712 Other spondylosis with myelopathy, cervical region: Secondary | ICD-10-CM | POA: Diagnosis not present

## 2015-07-23 ENCOUNTER — Encounter: Payer: Self-pay | Admitting: Family Medicine

## 2015-07-25 ENCOUNTER — Telehealth: Payer: Self-pay | Admitting: *Deleted

## 2015-07-25 NOTE — Telephone Encounter (Signed)
Patient called to cancel her stab phlebectomy procedure on 9/26 because she got a spider bite that is right over one of the veins to be treated. She will call to reschedule when she has healed from this event.

## 2015-07-28 DIAGNOSIS — M4712 Other spondylosis with myelopathy, cervical region: Secondary | ICD-10-CM | POA: Diagnosis not present

## 2015-07-28 DIAGNOSIS — M542 Cervicalgia: Secondary | ICD-10-CM | POA: Diagnosis not present

## 2015-07-28 DIAGNOSIS — M4802 Spinal stenosis, cervical region: Secondary | ICD-10-CM | POA: Diagnosis not present

## 2015-08-01 ENCOUNTER — Other Ambulatory Visit: Payer: Medicare Other | Admitting: Vascular Surgery

## 2015-08-01 ENCOUNTER — Other Ambulatory Visit: Payer: Self-pay | Admitting: Family Medicine

## 2015-08-04 ENCOUNTER — Ambulatory Visit: Payer: Medicare Other

## 2015-08-05 DIAGNOSIS — M4712 Other spondylosis with myelopathy, cervical region: Secondary | ICD-10-CM | POA: Diagnosis not present

## 2015-08-07 ENCOUNTER — Other Ambulatory Visit: Payer: Self-pay | Admitting: Family Medicine

## 2015-08-08 ENCOUNTER — Ambulatory Visit (INDEPENDENT_AMBULATORY_CARE_PROVIDER_SITE_OTHER): Payer: Medicare Other | Admitting: Podiatry

## 2015-08-08 DIAGNOSIS — B351 Tinea unguium: Secondary | ICD-10-CM | POA: Diagnosis not present

## 2015-08-08 DIAGNOSIS — M79673 Pain in unspecified foot: Secondary | ICD-10-CM

## 2015-08-08 NOTE — Progress Notes (Signed)
   Subjective:    Patient ID: MAYELI BORNHORST, female    DOB: 1946/11/02, 69 y.o.   MRN: [id number]  HPI Comments: "I need the toenails looked at and the skin is extremely sensitive."  Patient states that she had an ulcer lateral left leg that has healed but the area itself is sore still. Also, c/o that the skin and the feet and legs in general are extremely sensitive to touch and put socks or stockings on. The toenails need to be cut as well.      Review of Systems  HENT: Positive for tinnitus.   Respiratory: Positive for apnea.   Cardiovascular: Positive for leg swelling.  All other systems reviewed and are negative.      Objective:   Physical Exam: I have reviewed her past mental history medications allergies surgery social history and review of systems. Pulses are palpable bilateral. Neurologic sensorium is intact bilateral. Deep tendon reflexes intact bilateral. Muscle strength symmetrical and normal bilateral. Venous insufficiency resulting in multiple varicosities around the ankle and edema bilateral. History of previous venous insufficiency and ulceration. Her nails are thick yellow dystrophic onychomycotic and painful palpation as well as debridement.        Assessment & Plan:  Assessment pain in limb secondary to onychomycosis and neuropathic changes associated with venous insufficiency.  Plan: Debrided nails 1 through 5 bilateral. Discussed oral therapy and she declined. 3 mo.   Roselind Messier DPM

## 2015-08-11 ENCOUNTER — Other Ambulatory Visit (HOSPITAL_COMMUNITY): Payer: Self-pay | Admitting: Neurological Surgery

## 2015-08-18 ENCOUNTER — Other Ambulatory Visit: Payer: Self-pay

## 2015-08-18 ENCOUNTER — Encounter (HOSPITAL_COMMUNITY): Payer: Self-pay

## 2015-08-18 ENCOUNTER — Ambulatory Visit (INDEPENDENT_AMBULATORY_CARE_PROVIDER_SITE_OTHER): Payer: Medicare Other | Admitting: Family Medicine

## 2015-08-18 ENCOUNTER — Encounter (HOSPITAL_COMMUNITY)
Admission: RE | Admit: 2015-08-18 | Discharge: 2015-08-18 | Disposition: A | Discharge status: Home / Self Care | Payer: Medicare Other | Source: Ambulatory Visit | Attending: Neurological Surgery | Admitting: Neurological Surgery

## 2015-08-18 ENCOUNTER — Encounter: Payer: Self-pay | Admitting: Family Medicine

## 2015-08-18 VITALS — BP 134/84 | HR 78 | Temp 97.5°F | Wt 212.8 lb

## 2015-08-18 DIAGNOSIS — I10 Essential (primary) hypertension: Secondary | ICD-10-CM | POA: Insufficient documentation

## 2015-08-18 DIAGNOSIS — M5412 Radiculopathy, cervical region: Secondary | ICD-10-CM | POA: Insufficient documentation

## 2015-08-18 DIAGNOSIS — Z87891 Personal history of nicotine dependence: Secondary | ICD-10-CM | POA: Diagnosis not present

## 2015-08-18 DIAGNOSIS — Z23 Encounter for immunization: Secondary | ICD-10-CM

## 2015-08-18 DIAGNOSIS — I872 Venous insufficiency (chronic) (peripheral): Secondary | ICD-10-CM

## 2015-08-18 DIAGNOSIS — J45909 Unspecified asthma, uncomplicated: Secondary | ICD-10-CM | POA: Diagnosis not present

## 2015-08-18 DIAGNOSIS — G4733 Obstructive sleep apnea (adult) (pediatric): Secondary | ICD-10-CM | POA: Diagnosis not present

## 2015-08-18 DIAGNOSIS — M4712 Other spondylosis with myelopathy, cervical region: Secondary | ICD-10-CM | POA: Insufficient documentation

## 2015-08-18 DIAGNOSIS — Z01818 Encounter for other preprocedural examination: Secondary | ICD-10-CM | POA: Diagnosis not present

## 2015-08-18 DIAGNOSIS — R9431 Abnormal electrocardiogram [ECG] [EKG]: Secondary | ICD-10-CM | POA: Diagnosis not present

## 2015-08-18 DIAGNOSIS — Z86718 Personal history of other venous thrombosis and embolism: Secondary | ICD-10-CM | POA: Diagnosis not present

## 2015-08-18 DIAGNOSIS — Z7982 Long term (current) use of aspirin: Secondary | ICD-10-CM | POA: Insufficient documentation

## 2015-08-18 DIAGNOSIS — Z01812 Encounter for preprocedural laboratory examination: Secondary | ICD-10-CM | POA: Diagnosis not present

## 2015-08-18 DIAGNOSIS — G959 Disease of spinal cord, unspecified: Secondary | ICD-10-CM | POA: Insufficient documentation

## 2015-08-18 DIAGNOSIS — M479 Spondylosis, unspecified: Secondary | ICD-10-CM | POA: Diagnosis not present

## 2015-08-18 DIAGNOSIS — Z79899 Other long term (current) drug therapy: Secondary | ICD-10-CM | POA: Insufficient documentation

## 2015-08-18 DIAGNOSIS — M5020 Other cervical disc displacement, unspecified cervical region: Secondary | ICD-10-CM

## 2015-08-18 HISTORY — DX: Failed or difficult intubation, initial encounter: T88.4XXA

## 2015-08-18 HISTORY — DX: Pneumonia, unspecified organism: J18.9

## 2015-08-18 LAB — CBC
HCT: 40 % (ref 36.0–46.0)
Hemoglobin: 13.2 g/dL (ref 12.0–15.0)
MCH: 30.2 pg (ref 26.0–34.0)
MCHC: 33 g/dL (ref 30.0–36.0)
MCV: 91.5 fL (ref 78.0–100.0)
Platelets: 184 10*3/uL (ref 150–400)
RBC: 4.37 MIL/uL (ref 3.87–5.11)
RDW: 13 % (ref 11.5–15.5)
WBC: 7.4 10*3/uL (ref 4.0–10.5)

## 2015-08-18 LAB — BASIC METABOLIC PANEL
Anion gap: 10 (ref 5–15)
BUN: 16 mg/dL (ref 6–20)
CO2: 28 mmol/L (ref 22–32)
Calcium: 9.7 mg/dL (ref 8.9–10.3)
Chloride: 101 mmol/L (ref 101–111)
Creatinine, Ser: 0.79 mg/dL (ref 0.44–1.00)
GFR calc Af Amer: >60 mL/min (ref >60)
GFR calc non Af Amer: >60 mL/min (ref >60)
Glucose, Bld: 101 mg/dL — ABNORMAL HIGH (ref 65–99)
Potassium: 3.9 mmol/L (ref 3.5–5.1)
Sodium: 139 mmol/L (ref 135–145)

## 2015-08-18 LAB — SURGICAL PCR SCREEN
MRSA, PCR: NEGATIVE
Staphylococcus aureus: NEGATIVE

## 2015-08-18 NOTE — Patient Instructions (Addendum)
Good luck with upcoming surgery. If leg swelling - increase lasix to daily. Continue gabapentin Return after 09/20/2015 for medicare wellness visit

## 2015-08-18 NOTE — Progress Notes (Signed)
BP 134/84 mmHg  Pulse 78  Temp(Src) 97.5 F (36.4 C) (Oral)  Wt 212 lb 12 oz (96.503 kg)   CC: f/u visit  Subjective:    Patient ID: [name], female    DOB: 09-Nov-1945, 69 y.o.   MRN: [id number]  HPI: [name] is a 69 y.o. female presenting on 08/18/2015 for Skin Ulcer   H/o chronic venous insufficiency with severe reflux and painful varicosities as well as h/o venous ulcers - followed by [facility], s/p L endovenous ablation of accessory branch of great saphenous vein (2016). Had phlebotomy scheduled for last month - because gabapentin helped she has cancelled procedure, also wants to first undergo cervical surgery (See below).  Over last few days has noticed some weeping of legs L>R. Actually today better.   Recently diagnosed with neuropathic arthritis by podiatry, trialed gabapentin which helped.   Using compression stockings. ABIs normal 2016.  Recently saw Dr Ellene Route - acutely herniated disc pending ACDF on Monday. Persistent paresthesias, gabapentin helpful.  Relevant past medical, surgical, family and social history reviewed and updated as indicated. Interim medical history since our last visit reviewed. Allergies and medications reviewed and updated. Current Outpatient Prescriptions on File Prior to Visit  Medication Sig  . acetaminophen (TYLENOL ARTHRITIS PAIN) 650 MG CR tablet Take 650 mg by mouth every 8 (eight) hours as needed for pain.  Marland Kitchen aspirin EC 81 MG tablet Take 81 mg by mouth at bedtime. Melanoma prevention  . KLOR-CON M10 10 MEQ tablet TAKE 2 TABLETS BY MOUTH DAILY.  Marland Kitchen losartan-hydrochlorothiazide (HYZAAR) 50-12.5 MG per tablet TAKE 1 TABLET BY MOUTH DAILY.  . Multiple Vitamin (MULTIVITAMIN WITH MINERALS) TABS tablet Take 1 tablet by mouth daily.  . Polyvinyl Alcohol-Povidone (TEARS PLUS OP) Place 1 drop into both eyes daily as needed (dry eyes/ redness/ burning).  Marland Kitchen PRESCRIPTION MEDICATION CPAP  . PROAIR HFA 108 (90 BASE) MCG/ACT inhaler INHALE 2 PUFFS  INTO LUNGS EVERY 6 HOURS AS NEEDED FOR WHEEZING OR SHORTNESS OF BREATH  . Probiotic Product (PROBIOTIC DAILY PO) Take 1 tablet by mouth daily.   . Vitamin D, Ergocalciferol, (DRISDOL) 50000 UNITS CAPS capsule TAKE 1 CAPSULE (50,000 UNITS TOTAL) BY MOUTH EVERY 7 (SEVEN) DAYS. (Patient taking differently: TAKE 1 CAPSULE (50,000 UNITS TOTAL) BY MOUTH ON WEDNESDAYS)   No current facility-administered medications on file prior to visit.    Review of Systems Per HPI unless specifically indicated above     Objective:    BP 134/84 mmHg  Pulse 78  Temp(Src) 97.5 F (36.4 C) (Oral)  Wt 212 lb 12 oz (96.503 kg)  Wt Readings from Last 3 Encounters:  08/18/15 212 lb 12 oz (96.503 kg)  06/24/15 212 lb 8 oz (96.389 kg)  05/24/15 211 lb 12.8 oz (96.072 kg)    Physical Exam  Constitutional: She appears well-developed and well-nourished. No distress.  Musculoskeletal: She exhibits edema (tr pitting bilateral).  No weeping today, no ulcers today  Skin: Skin is warm and dry. No rash noted.  Psychiatric: She has a normal mood and affect.  Vitals reviewed.  Results for orders placed or performed in visit on 04/08/15  HM MAMMOGRAPHY  Result Value Ref Range   HM Mammogram Normal Birads 2-Repeat 1 year       Assessment & Plan:   Problem List Items Addressed This Visit    Chronic venous insufficiency    Gabapentin has helped sxs.  Discussed management of pedal edema with weeping fluid - temporary increase of  lasix to daily dosing. Recommended continue compression stockings and elevation of legs.       Relevant Medications   furosemide (LASIX) 20 MG tablet   Cervical herniated disc - Primary    Records pt brings reviewed from latest NSG note. Discussed upcoming ACDF surgery next week by Dr Ellene Route. Pt has pending preop eval at 2pm today at hospital.        Other Visit Diagnoses    Need for influenza vaccination        Relevant Orders    Flu Vaccine QUAD 36+ mos PF IM (Fluarix & Fluzone  Quad PF) (Completed)        Follow up plan: Return in about 5 weeks (around 09/20/2015), or as needed, for medicare wellness visit.

## 2015-08-18 NOTE — Pre-Procedure Instructions (Signed)
[name]  08/18/2015      [id number] - [name], [place] [address]  [address] Phone: [phone] Fax: [phone]  [id number] - [place], [address] [place] [address] Phone: [phone] Fax: [phone]    Your procedure is scheduled on Oct 17  Report to [facility] at 945 A.M.  Call this number if you have problems the morning of surgery:  [phone]   Remember:  Do not eat food or drink liquids after midnight.  Take these medicines the morning of surgery with A SIP OF WATER gabapentin (Neurontin), Proair inhaler-bring inhaler with you on the day of surgery  Stop taking aspirin, Ibuprofen, BC's, Goody's, Herbal medications, Fish Oil, Multi vitamins and mineral   Do not wear jewelry, make-up or nail polish.  Do not wear lotions, powders, or perfumes.  You may wear deodorant.  Do not shave 48 hours prior to surgery.  Men may shave face and neck.  Do not bring valuables to the hospital.  [facility] is not responsible for any belongings or valuables.  Contacts, dentures or bridgework may not be worn into surgery.  Leave your suitcase in the car.  After surgery it may be brought to your room.  For patients admitted to the hospital, discharge time will be determined by your treatment team.  Patients discharged the day of surgery will not be allowed to drive home.    Special instructions:  [facility] - Preparing for Surgery  Before surgery, you can play an important role.  Because skin is not sterile, your skin needs to be as free of germs as possible.  You can reduce the number of germs on you skin by washing with CHG (chlorahexidine gluconate) soap before surgery.  CHG is an antiseptic cleaner which kills germs and bonds with the skin to continue killing germs even after washing.  Please DO NOT use if you have an  allergy to CHG or antibacterial soaps.  If your skin becomes reddened/irritated stop using the CHG and inform your nurse when you arrive at Short Stay.  Do not shave (including legs and underarms) for at least 48 hours prior to the first CHG shower.  You may shave your face.  Please follow these instructions carefully:   1.  Shower with CHG Soap the night before surgery and the                                morning of Surgery.  2.  If you choose to wash your hair, wash your hair first as usual with your       normal shampoo.  3.  After you shampoo, rinse your hair and body thoroughly to remove the                      Shampoo.  4.  Use CHG as you would any other liquid soap.  You can apply chg directly       to the skin and wash gently with scrungie or a clean washcloth.  5.  Apply the CHG Soap to your body ONLY FROM THE NECK DOWN.        Do not use on open wounds or open sores.  Avoid contact with your eyes,  ears, mouth and genitals (private parts).  Wash genitals (private parts)       with your normal soap.  6.  Wash thoroughly, paying special attention to the area where your surgery        will be performed.  7.  Thoroughly rinse your body with warm water from the neck down.  8.  DO NOT shower/wash with your normal soap after using and rinsing off       the CHG Soap.  9.  Pat yourself dry with a clean towel.            10.  Wear clean pajamas.            11.  Place clean sheets on your bed the night of your first shower and do not        sleep with pets.  Day of Surgery  Do not apply any lotions/deoderants the morning of surgery.  Please wear clean clothes to the hospital/surgery center.     Please read over the following fact sheets that you were given. Pain Booklet, Coughing and Deep Breathing, MRSA Information and Surgical Site Infection Prevention

## 2015-08-18 NOTE — Assessment & Plan Note (Signed)
Gabapentin has helped sxs.  Discussed management of pedal edema with weeping fluid - temporary increase of lasix to daily dosing. Recommended continue compression stockings and elevation of legs.

## 2015-08-18 NOTE — Progress Notes (Signed)
Pre visit review using our clinic review tool, if applicable. No additional management support is needed unless otherwise documented below in the visit note. 

## 2015-08-18 NOTE — Progress Notes (Signed)
PCP is Dr [name] Denies seeing a cardiologist Denies ever having a card cath or echo Instructed pt to bring in her cpap mask on the day of surgery. Pt reports she has had difficulty waking up due to sleep apnea after surgery. She reports she wears her cpap anytime she sleeps or even naps.

## 2015-08-18 NOTE — Assessment & Plan Note (Addendum)
Records pt brings reviewed from latest NSG note. Discussed upcoming ACDF surgery next week by Dr Ellene Route. Pt has pending preop eval at 2pm today at hospital.

## 2015-08-19 NOTE — Progress Notes (Signed)
Anesthesia Chart Review:  Pt is 69 year old female scheduled for C3-4 ACDF on 08/22/2015 with Dr. Ellene Route.   PMH includes: HTN, asthma, DVT (2011), chronic venous insufficiency, OSA (on CPAP), melanoma. Former smoker. BMI 37.5. S/p removal port-a-cath 12/05/11.   Anesthesia history lists difficult intubation but pt denies this and I do not find a reference to it in previous anesthesia records (from 2011, on paper chart) except it is noted pt has small mouth opening. History also includes prolonged emergence due to OSA requiring CPAP.   Medications include: ASA, lasix, potassium, losartan-hctz, albuterol.   Preoperative labs reviewed.    EKG 08/18/2015: NSR. LAD. Non-specific intra-ventricular conduction block. Cannot rule out Anterior infarct, age undetermined. No significant change from 08/23/10 EKG tracing per Dr. Tonita Phoenix interpretation. Also appears similar to tracing dated 06/01/09 (found in correspondence dated 08/29/10 in media tab).   Nuclear stress test 06/08/2009 (found in correspondence dated 08/29/10 in media tab): 1. Normal cardiac perfusion 2. Normal wall motion 3. LV EF 66%  EKG stable since before stress test.   If no changes, I anticipate pt can proceed with surgery as scheduled.   Willeen Cass, FNP-BC [facility]/Anesthesiology Phone: [phone] 08/19/2015 1:27 PM

## 2015-08-21 MED ORDER — CEFAZOLIN SODIUM-DEXTROSE 2-3 GM-% IV SOLR
2.0000 g | INTRAVENOUS | Status: AC
  Administered 2015-08-22: 2 g via INTRAVENOUS
  Filled 2015-08-21: qty 50

## 2015-08-22 ENCOUNTER — Inpatient Hospital Stay (HOSPITAL_COMMUNITY)
Admission: RE | Admit: 2015-08-22 | Discharge: 2015-08-23 | DRG: 473 | Disposition: A | Discharge status: Home / Self Care | Payer: Medicare Other | Source: Ambulatory Visit | Attending: Neurological Surgery | Admitting: Neurological Surgery

## 2015-08-22 ENCOUNTER — Inpatient Hospital Stay (HOSPITAL_COMMUNITY): Payer: Medicare Other | Admitting: Anesthesiology

## 2015-08-22 ENCOUNTER — Inpatient Hospital Stay (HOSPITAL_COMMUNITY): Payer: Medicare Other | Admitting: Emergency Medicine

## 2015-08-22 ENCOUNTER — Inpatient Hospital Stay (HOSPITAL_COMMUNITY): Payer: Medicare Other

## 2015-08-22 ENCOUNTER — Encounter (HOSPITAL_COMMUNITY)
Admission: RE | Disposition: A | Discharge status: Home / Self Care | Payer: Self-pay | Source: Ambulatory Visit | Attending: Neurological Surgery

## 2015-08-22 ENCOUNTER — Encounter (HOSPITAL_COMMUNITY): Payer: Self-pay

## 2015-08-22 DIAGNOSIS — M4722 Other spondylosis with radiculopathy, cervical region: Secondary | ICD-10-CM | POA: Diagnosis present

## 2015-08-22 DIAGNOSIS — M4712 Other spondylosis with myelopathy, cervical region: Secondary | ICD-10-CM | POA: Diagnosis present

## 2015-08-22 DIAGNOSIS — Z7982 Long term (current) use of aspirin: Secondary | ICD-10-CM | POA: Diagnosis not present

## 2015-08-22 DIAGNOSIS — J45909 Unspecified asthma, uncomplicated: Secondary | ICD-10-CM | POA: Diagnosis present

## 2015-08-22 DIAGNOSIS — M47812 Spondylosis without myelopathy or radiculopathy, cervical region: Secondary | ICD-10-CM | POA: Diagnosis not present

## 2015-08-22 DIAGNOSIS — Z91048 Other nonmedicinal substance allergy status: Secondary | ICD-10-CM

## 2015-08-22 DIAGNOSIS — Z87891 Personal history of nicotine dependence: Secondary | ICD-10-CM | POA: Diagnosis not present

## 2015-08-22 DIAGNOSIS — Z419 Encounter for procedure for purposes other than remedying health state, unspecified: Secondary | ICD-10-CM

## 2015-08-22 DIAGNOSIS — Z79899 Other long term (current) drug therapy: Secondary | ICD-10-CM

## 2015-08-22 DIAGNOSIS — K219 Gastro-esophageal reflux disease without esophagitis: Secondary | ICD-10-CM | POA: Diagnosis present

## 2015-08-22 DIAGNOSIS — M62838 Other muscle spasm: Secondary | ICD-10-CM | POA: Diagnosis present

## 2015-08-22 DIAGNOSIS — I10 Essential (primary) hypertension: Secondary | ICD-10-CM | POA: Diagnosis present

## 2015-08-22 DIAGNOSIS — M4322 Fusion of spine, cervical region: Secondary | ICD-10-CM | POA: Diagnosis not present

## 2015-08-22 DIAGNOSIS — M1990 Unspecified osteoarthritis, unspecified site: Secondary | ICD-10-CM | POA: Diagnosis present

## 2015-08-22 DIAGNOSIS — H9193 Unspecified hearing loss, bilateral: Secondary | ICD-10-CM | POA: Diagnosis present

## 2015-08-22 DIAGNOSIS — G2581 Restless legs syndrome: Secondary | ICD-10-CM | POA: Diagnosis present

## 2015-08-22 DIAGNOSIS — Z882 Allergy status to sulfonamides status: Secondary | ICD-10-CM | POA: Diagnosis not present

## 2015-08-22 DIAGNOSIS — G4733 Obstructive sleep apnea (adult) (pediatric): Secondary | ICD-10-CM | POA: Diagnosis present

## 2015-08-22 HISTORY — PX: ANTERIOR CERVICAL DECOMP/DISCECTOMY FUSION: SHX1161

## 2015-08-22 HISTORY — DX: Family history of other specified conditions: Z84.89

## 2015-08-22 SURGERY — ANTERIOR CERVICAL DECOMPRESSION/DISCECTOMY FUSION 1 LEVEL
Anesthesia: General | Site: Spine Cervical

## 2015-08-22 MED ORDER — 0.9 % SODIUM CHLORIDE (POUR BTL) OPTIME
TOPICAL | Status: DC | PRN
  Administered 2015-08-22: 1000 mL

## 2015-08-22 MED ORDER — SODIUM CHLORIDE 0.9 % IJ SOLN: 3.0000 mL | INTRAMUSCULAR | Status: DC | PRN

## 2015-08-22 MED ORDER — ACETAMINOPHEN 160 MG/5ML PO SOLN
325.0000 mg | ORAL | Status: DC | PRN
  Filled 2015-08-22: qty 20.3

## 2015-08-22 MED ORDER — GLYCOPYRROLATE 0.2 MG/ML IJ SOLN
INTRAMUSCULAR | Status: DC | PRN
  Administered 2015-08-22: 0.4 mg via INTRAVENOUS

## 2015-08-22 MED ORDER — PROBIOTIC DAILY PO CAPS: ORAL_CAPSULE | Freq: Every day | ORAL | Status: DC

## 2015-08-22 MED ORDER — PHENYLEPHRINE HCL 10 MG/ML IJ SOLN
INTRAMUSCULAR | Status: DC | PRN
  Administered 2015-08-22 (×4): 80 ug via INTRAVENOUS

## 2015-08-22 MED ORDER — METHOCARBAMOL 500 MG PO TABS
500.0000 mg | ORAL_TABLET | Freq: Four times a day (QID) | ORAL | Status: DC | PRN
  Administered 2015-08-22 (×2): 500 mg via ORAL
  Filled 2015-08-22 (×2): qty 1

## 2015-08-22 MED ORDER — ALBUTEROL SULFATE (2.5 MG/3ML) 0.083% IN NEBU: 3.0000 mL | INHALATION_SOLUTION | RESPIRATORY_TRACT | Status: DC | PRN

## 2015-08-22 MED ORDER — HYDROCODONE-ACETAMINOPHEN 5-325 MG PO TABS: 1.0000 | ORAL_TABLET | ORAL | Status: DC | PRN

## 2015-08-22 MED ORDER — GABAPENTIN 300 MG PO CAPS
300.0000 mg | ORAL_CAPSULE | Freq: Every day | ORAL | Status: DC
Start: 2015-08-22 — End: 2015-08-23
  Administered 2015-08-22: 300 mg via ORAL
  Filled 2015-08-22: qty 1

## 2015-08-22 MED ORDER — HEMOSTATIC AGENTS (NO CHARGE) OPTIME
TOPICAL | Status: DC | PRN
  Administered 2015-08-22: 1 via TOPICAL

## 2015-08-22 MED ORDER — SODIUM CHLORIDE 0.9 % IJ SOLN: 3.0000 mL | Freq: Two times a day (BID) | INTRAMUSCULAR | Status: DC

## 2015-08-22 MED ORDER — ONDANSETRON HCL 4 MG/2ML IJ SOLN: 4.0000 mg | INTRAMUSCULAR | Status: DC | PRN

## 2015-08-22 MED ORDER — ONDANSETRON HCL 4 MG/2ML IJ SOLN
INTRAMUSCULAR | Status: AC
  Filled 2015-08-22: qty 2

## 2015-08-22 MED ORDER — FENTANYL CITRATE (PF) 250 MCG/5ML IJ SOLN
INTRAMUSCULAR | Status: AC
  Filled 2015-08-22: qty 5

## 2015-08-22 MED ORDER — PHENOL 1.4 % MT LIQD
1.0000 | OROMUCOSAL | Status: DC | PRN
  Administered 2015-08-22: 1 via OROMUCOSAL
  Filled 2015-08-22: qty 177

## 2015-08-22 MED ORDER — ACETAMINOPHEN 650 MG RE SUPP: 650.0000 mg | RECTAL | Status: DC | PRN

## 2015-08-22 MED ORDER — OXYCODONE HCL 5 MG PO TABS: 5.0000 mg | ORAL_TABLET | Freq: Once | ORAL | Status: DC | PRN

## 2015-08-22 MED ORDER — MIDAZOLAM HCL 2 MG/2ML IJ SOLN
INTRAMUSCULAR | Status: AC
  Filled 2015-08-22: qty 4

## 2015-08-22 MED ORDER — LACTATED RINGERS IV SOLN
INTRAVENOUS | Status: DC
  Administered 2015-08-22: 11:00:00 via INTRAVENOUS

## 2015-08-22 MED ORDER — THROMBIN 5000 UNITS EX SOLR
CUTANEOUS | Status: DC | PRN
Start: 2015-08-22 — End: 2015-08-22
  Administered 2015-08-22 (×2): 5000 [IU] via TOPICAL

## 2015-08-22 MED ORDER — OXYCODONE-ACETAMINOPHEN 5-325 MG PO TABS
1.0000 | ORAL_TABLET | ORAL | Status: DC | PRN
  Administered 2015-08-22: 2 via ORAL
  Administered 2015-08-22: 1 via ORAL
  Administered 2015-08-23 (×2): 2 via ORAL
  Filled 2015-08-22: qty 1
  Filled 2015-08-22 (×3): qty 2

## 2015-08-22 MED ORDER — DEXAMETHASONE SODIUM PHOSPHATE 10 MG/ML IJ SOLN
INTRAMUSCULAR | Status: DC | PRN
  Administered 2015-08-22: 10 mg via INTRAVENOUS

## 2015-08-22 MED ORDER — BUPIVACAINE HCL (PF) 0.25 % IJ SOLN
INTRAMUSCULAR | Status: DC | PRN
  Administered 2015-08-22: 5 mL

## 2015-08-22 MED ORDER — LIDOCAINE-EPINEPHRINE 1 %-1:100000 IJ SOLN
INTRAMUSCULAR | Status: DC | PRN
Start: 2015-08-22 — End: 2015-08-22
  Administered 2015-08-22: 5 mL

## 2015-08-22 MED ORDER — MENTHOL 3 MG MT LOZG
1.0000 | LOZENGE | OROMUCOSAL | Status: DC | PRN
  Filled 2015-08-22: qty 9

## 2015-08-22 MED ORDER — FENTANYL CITRATE (PF) 100 MCG/2ML IJ SOLN
INTRAMUSCULAR | Status: DC | PRN
  Administered 2015-08-22 (×2): 50 ug via INTRAVENOUS
  Administered 2015-08-22: 100 ug via INTRAVENOUS

## 2015-08-22 MED ORDER — MONTELUKAST SODIUM 10 MG PO TABS
10.0000 mg | ORAL_TABLET | Freq: Every day | ORAL | Status: DC
  Administered 2015-08-22: 10 mg via ORAL
  Filled 2015-08-22 (×2): qty 1

## 2015-08-22 MED ORDER — LOSARTAN POTASSIUM-HCTZ 50-12.5 MG PO TABS: 1.0000 | ORAL_TABLET | Freq: Every day | ORAL | Status: DC

## 2015-08-22 MED ORDER — HYDROMORPHONE HCL 1 MG/ML IJ SOLN
INTRAMUSCULAR | Status: AC
  Filled 2015-08-22: qty 1

## 2015-08-22 MED ORDER — PROPOFOL 10 MG/ML IV BOLUS
INTRAVENOUS | Status: DC | PRN
  Administered 2015-08-22: 20 mg via INTRAVENOUS
  Administered 2015-08-22: 140 mg via INTRAVENOUS

## 2015-08-22 MED ORDER — ALUM & MAG HYDROXIDE-SIMETH 200-200-20 MG/5ML PO SUSP: 30.0000 mL | Freq: Four times a day (QID) | ORAL | Status: DC | PRN

## 2015-08-22 MED ORDER — MIDAZOLAM HCL 5 MG/5ML IJ SOLN
INTRAMUSCULAR | Status: DC | PRN
  Administered 2015-08-22: 1 mg via INTRAVENOUS

## 2015-08-22 MED ORDER — NEOSTIGMINE METHYLSULFATE 10 MG/10ML IV SOLN
INTRAVENOUS | Status: DC | PRN
  Administered 2015-08-22: 3 mg via INTRAVENOUS

## 2015-08-22 MED ORDER — GLYCOPYRROLATE 0.2 MG/ML IJ SOLN
INTRAMUSCULAR | Status: AC
  Filled 2015-08-22: qty 2

## 2015-08-22 MED ORDER — BACITRACIN 50000 UNITS IM SOLR
INTRAMUSCULAR | Status: DC | PRN
  Administered 2015-08-22: 500 mL

## 2015-08-22 MED ORDER — FUROSEMIDE 20 MG PO TABS
20.0000 mg | ORAL_TABLET | ORAL | Status: DC
  Administered 2015-08-23: 20 mg via ORAL
  Filled 2015-08-22: qty 1

## 2015-08-22 MED ORDER — LOSARTAN POTASSIUM 50 MG PO TABS
50.0000 mg | ORAL_TABLET | Freq: Every day | ORAL | Status: DC
  Administered 2015-08-23: 50 mg via ORAL
  Filled 2015-08-22: qty 1

## 2015-08-22 MED ORDER — ACETAMINOPHEN 325 MG PO TABS: 325.0000 mg | ORAL_TABLET | ORAL | Status: DC | PRN

## 2015-08-22 MED ORDER — LIDOCAINE HCL (CARDIAC) 20 MG/ML IV SOLN
INTRAVENOUS | Status: AC
  Filled 2015-08-22: qty 5

## 2015-08-22 MED ORDER — OXYCODONE HCL 5 MG/5ML PO SOLN
5.0000 mg | Freq: Once | ORAL | Status: DC | PRN
Start: 2015-08-22 — End: 2015-08-22

## 2015-08-22 MED ORDER — PROPOFOL 10 MG/ML IV BOLUS
INTRAVENOUS | Status: AC
  Filled 2015-08-22: qty 20

## 2015-08-22 MED ORDER — POTASSIUM CHLORIDE CRYS ER 20 MEQ PO TBCR
20.0000 meq | EXTENDED_RELEASE_TABLET | Freq: Every day | ORAL | Status: DC
  Filled 2015-08-22: qty 1

## 2015-08-22 MED ORDER — HYDROMORPHONE HCL 1 MG/ML IJ SOLN
0.2500 mg | INTRAMUSCULAR | Status: DC | PRN
  Administered 2015-08-22 (×2): 0.5 mg via INTRAVENOUS

## 2015-08-22 MED ORDER — ROCURONIUM BROMIDE 50 MG/5ML IV SOLN
INTRAVENOUS | Status: AC
  Filled 2015-08-22: qty 1

## 2015-08-22 MED ORDER — HYDROCHLOROTHIAZIDE 12.5 MG PO CAPS
12.5000 mg | ORAL_CAPSULE | Freq: Every day | ORAL | Status: DC
  Administered 2015-08-23: 12.5 mg via ORAL
  Filled 2015-08-22: qty 1

## 2015-08-22 MED ORDER — MORPHINE SULFATE (PF) 2 MG/ML IV SOLN: 1.0000 mg | INTRAVENOUS | Status: DC | PRN

## 2015-08-22 MED ORDER — METHOCARBAMOL 1000 MG/10ML IJ SOLN
500.0000 mg | Freq: Four times a day (QID) | INTRAVENOUS | Status: DC | PRN
  Filled 2015-08-22: qty 5

## 2015-08-22 MED ORDER — SODIUM CHLORIDE 0.9 % IV SOLN: 250.0000 mL | INTRAVENOUS | Status: DC

## 2015-08-22 MED ORDER — ACETAMINOPHEN 325 MG PO TABS: 650.0000 mg | ORAL_TABLET | ORAL | Status: DC | PRN

## 2015-08-22 MED ORDER — LIDOCAINE HCL (CARDIAC) 20 MG/ML IV SOLN
INTRAVENOUS | Status: DC | PRN
  Administered 2015-08-22: 40 mg via INTRAVENOUS

## 2015-08-22 MED ORDER — RISAQUAD PO CAPS
1.0000 | ORAL_CAPSULE | Freq: Every day | ORAL | Status: DC
  Administered 2015-08-23: 1 via ORAL
  Filled 2015-08-22: qty 1

## 2015-08-22 MED ORDER — PHENYLEPHRINE 40 MCG/ML (10ML) SYRINGE FOR IV PUSH (FOR BLOOD PRESSURE SUPPORT)
PREFILLED_SYRINGE | INTRAVENOUS | Status: AC
  Filled 2015-08-22: qty 10

## 2015-08-22 MED ORDER — ROCURONIUM BROMIDE 100 MG/10ML IV SOLN
INTRAVENOUS | Status: DC | PRN
  Administered 2015-08-22: 50 mg via INTRAVENOUS

## 2015-08-22 SURGICAL SUPPLY — 53 items
ALLOGRAFT LORDOTIC CC 7X11X14 (Bone Implant) ×2 IMPLANT
BAG DECANTER FOR FLEXI CONT (MISCELLANEOUS) ×2 IMPLANT
BIT DRILL NEURO 2X3.1 SFT TUCH (MISCELLANEOUS) ×1 IMPLANT
BIT DRILL POWER (BIT) ×1 IMPLANT
BNDG GAUZE ELAST 4 BULKY (GAUZE/BANDAGES/DRESSINGS) IMPLANT
BUR BARREL STRAIGHT FLUTE 4.0 (BURR) ×2 IMPLANT
CANISTER SUCT 3000ML PPV (MISCELLANEOUS) ×2 IMPLANT
DECANTER SPIKE VIAL GLASS SM (MISCELLANEOUS) ×2 IMPLANT
DERMABOND ADVANCED (GAUZE/BANDAGES/DRESSINGS) ×1
DERMABOND ADVANCED .7 DNX12 (GAUZE/BANDAGES/DRESSINGS) ×1 IMPLANT
DRAPE LAPAROTOMY 100X72 PEDS (DRAPES) ×2 IMPLANT
DRAPE MICROSCOPE LEICA (MISCELLANEOUS) ×2 IMPLANT
DRAPE POUCH INSTRU U-SHP 10X18 (DRAPES) ×2 IMPLANT
DRILL BIT POWER (BIT) ×1
DRILL NEURO 2X3.1 SOFT TOUCH (MISCELLANEOUS) ×2
DURAPREP 6ML APPLICATOR 50/CS (WOUND CARE) ×2 IMPLANT
ELECT REM PT RETURN 9FT ADLT (ELECTROSURGICAL) ×2
ELECTRODE REM PT RTRN 9FT ADLT (ELECTROSURGICAL) ×1 IMPLANT
GAUZE SPONGE 4X4 16PLY XRAY LF (GAUZE/BANDAGES/DRESSINGS) IMPLANT
GLOVE BIO SURGEON STRL SZ7.5 (GLOVE) IMPLANT
GLOVE BIOGEL PI IND STRL 7.0 (GLOVE) ×3 IMPLANT
GLOVE BIOGEL PI IND STRL 7.5 (GLOVE) ×1 IMPLANT
GLOVE BIOGEL PI IND STRL 8.5 (GLOVE) ×1 IMPLANT
GLOVE BIOGEL PI INDICATOR 7.0 (GLOVE) ×3
GLOVE BIOGEL PI INDICATOR 7.5 (GLOVE) ×1
GLOVE BIOGEL PI INDICATOR 8.5 (GLOVE) ×1
GLOVE ECLIPSE 8.5 STRL (GLOVE) ×2 IMPLANT
GLOVE EXAM NITRILE LRG STRL (GLOVE) IMPLANT
GLOVE EXAM NITRILE MD LF STRL (GLOVE) IMPLANT
GLOVE EXAM NITRILE XL STR (GLOVE) IMPLANT
GLOVE EXAM NITRILE XS STR PU (GLOVE) IMPLANT
GOWN STRL REUS W/ TWL LRG LVL3 (GOWN DISPOSABLE) ×2 IMPLANT
GOWN STRL REUS W/ TWL XL LVL3 (GOWN DISPOSABLE) ×2 IMPLANT
GOWN STRL REUS W/TWL 2XL LVL3 (GOWN DISPOSABLE) ×2 IMPLANT
GOWN STRL REUS W/TWL LRG LVL3 (GOWN DISPOSABLE) ×2
GOWN STRL REUS W/TWL XL LVL3 (GOWN DISPOSABLE) ×2
HALTER HD/CHIN CERV TRACTION D (MISCELLANEOUS) ×2 IMPLANT
KIT BASIN OR (CUSTOM PROCEDURE TRAY) ×2 IMPLANT
KIT ROOM TURNOVER OR (KITS) ×2 IMPLANT
NEEDLE HYPO 22GX1.5 SAFETY (NEEDLE) ×2 IMPLANT
NEEDLE SPNL 22GX3.5 QUINCKE BK (NEEDLE) ×2 IMPLANT
NS IRRIG 1000ML POUR BTL (IV SOLUTION) ×2 IMPLANT
PACK LAMINECTOMY NEURO (CUSTOM PROCEDURE TRAY) ×2 IMPLANT
PAD ARMBOARD 7.5X6 YLW CONV (MISCELLANEOUS) ×6 IMPLANT
PLATE ARCHON 1-LEVEL 22MM (Plate) ×2 IMPLANT
RUBBERBAND STERILE (MISCELLANEOUS) ×4 IMPLANT
SCREW ARCHON SELFTAP 4.0X13 (Screw) ×8 IMPLANT
SPONGE INTESTINAL PEANUT (DISPOSABLE) ×2 IMPLANT
SPONGE SURGIFOAM ABS GEL SZ50 (HEMOSTASIS) ×2 IMPLANT
SUT VIC AB 3-0 SH 8-18 (SUTURE) ×2 IMPLANT
TOWEL OR 17X24 6PK STRL BLUE (TOWEL DISPOSABLE) ×2 IMPLANT
TOWEL OR 17X26 10 PK STRL BLUE (TOWEL DISPOSABLE) ×2 IMPLANT
WATER STERILE IRR 1000ML POUR (IV SOLUTION) ×2 IMPLANT

## 2015-08-22 NOTE — Anesthesia Preprocedure Evaluation (Addendum)
Anesthesia Evaluation  Patient identified by MRN, date of birth, ID band Patient awake    Reviewed: Allergy & Precautions, NPO status , Patient's Chart, lab work & pertinent test results  History of Anesthesia Complications (+) DIFFICULT AIRWAY and Family history of anesthesia reaction  Airway Mallampati: IV       Dental  (+) Teeth Intact   Pulmonary former smoker,    breath sounds clear to auscultation       Cardiovascular hypertension, Pt. on medications  Rhythm:Regular     Neuro/Psych    GI/Hepatic   Endo/Other    Renal/GU      Musculoskeletal   Abdominal   Peds  Hematology   Anesthesia Other Findings   Reproductive/Obstetrics                            Anesthesia Physical Anesthesia Plan  ASA: III  Anesthesia Plan: General   Post-op Pain Management:    Induction: Intravenous  Airway Management Planned: Oral ETT and Video Laryngoscope Planned  Additional Equipment:   Intra-op Plan:   Post-operative Plan: Extubation in OR  Informed Consent: I have reviewed the patients History and Physical, chart, labs and discussed the procedure including the risks, benefits and alternatives for the proposed anesthesia with the patient or authorized representative who has indicated his/her understanding and acceptance.     Plan Discussed with: Anesthesiologist and CRNA  Anesthesia Plan Comments:         Anesthesia Quick Evaluation

## 2015-08-22 NOTE — Op Note (Signed)
Date of surgery: 08/22/2015 Preoperative diagnosis: Cervical spondylosis with radiculopathy and myelopathy C3-C4 Post operative diagnosis: Cervical spondylosis with radiculopathy and myelopathy C3-C4 Procedure: Anterior cervical discectomy decompression of nerve roots and spinal canal C3-C4 arthrodesis with structural allograft, nuvasive plate fixation [id number] Surgeon: Kristeen Miss M.D. Asst.: Deri Fuelling M.D. Indications: Patient is a 69 year old individual who has had progressive neck pain and arm weakness now with spasticity. She has evidence of cord compression at C3-C4 and she's been advised regarding the need for surgical decompression. Procedure: The patient was brought to the operating room placed on the table in supine position. After the smooth induction of general endotracheal anesthesia neck was placed in 5 pounds of halter traction and prepped with alcohol and DuraPrep. After sterile draping and appropriate timeout procedure a transverse incision was created in the left side of the neck and carried down to the platysma. The plane between the sternocleidomastoid and strap muscles dissected bluntly until the prevertebral space was reached. The first identifiable disc space was noted to be C3-4 on a localizing radiograph. The dissection was then undertaken in the longus coli muscle to allow placement of a self-retaining Caspar type retractor.  The anterior longitudinal ligament was opened at C3-4 and ventral osteophytes were removed with a Leksell rongeur and Kerrison punch. Interspace was cleared of significant quantity of the degenerated disc material in the region of the posterior longitudinal ligament was removed. Dissection was carried out using a high-speed drill and 3-0 Karlin curettes. Uncinate processes were drilled down and removed and osteophytes from the inferior margin of the body of C3 were removed with a Kerrison 2 mm gold punch. After the central canal and lateral recesses were well  decompressed hemostasis was achieved with the bipolar cautery and some small pledgets of Gelfoam soaked in thrombin that were later irrigated away.  A 7 mm cortical ring allograft was placed into the interspace. Traction was removed  Next the retractor was removed and a 22 mm nuvasive plate was placed over the vertebral bodies and secured with 13 mm variable angle screws. A final localizing radiograph identified the position of the surgical construct. The stasis was achieved in the soft tissues and then the platysma was closed with 3-0 Vicryl in an interrupted fashion and 3-0 Vicryl was used in the subcuticular tissue. Blood loss was estimated at 25 mL

## 2015-08-22 NOTE — Progress Notes (Signed)
RN called RT to setup CPAP for patient. Patient is using her home nose pillows and our CPAP machine. RT will continue to monitor.

## 2015-08-22 NOTE — Anesthesia Procedure Notes (Signed)
Procedure Name: Intubation Date/Time: 08/22/2015 11:24 AM Performed by: Rush Farmer E Pre-anesthesia Checklist: Patient identified, Emergency Drugs available, Suction available, Patient being monitored and Timeout performed Patient Re-evaluated:Patient Re-evaluated prior to inductionOxygen Delivery Method: Circle system utilized Preoxygenation: Pre-oxygenation with 100% oxygen Intubation Type: IV induction Ventilation: Mask ventilation without difficulty Laryngoscope Size: Glidescope Tube type: Oral Tube size: 7.0 mm Number of attempts: 1 Airway Equipment and Method: Video-laryngoscopy and Stylet Placement Confirmation: ETT inserted through vocal cords under direct vision,  positive ETCO2 and breath sounds checked- equal and bilateral Secured at: 21 cm Tube secured with: Tape Dental Injury: Teeth and Oropharynx as per pre-operative assessment  Comments: Electively used Glidescope for intubation. AOI, Grade I view on Glidescope screen. +ETCO2 & BBS=.

## 2015-08-22 NOTE — H&P (Signed)
[name] is an 69 y.o. female.   Chief Complaint: Neck and arm pain for 3 years time HPI: [name] is a 69 year old individual whom I followed for the last 3 years time. She is been seen and evaluated by Dr. how Jacelyn Grip and was found to have significant cervical spondylosis. She was noted to have evidence of effacement of her spinal canal and cord at the levels of C3-4 secondary to marked spondylosis at multiple levels more recently she's had an accentuation of her symptoms with increasing arm and neck pain and a repeat MRI demonstrates that she now has frank cord compression at level of C3-C4. She has some modest spasticity in the upper extremities and after careful consideration of her options I advised surgical decompression of C3-C4 alone. She is now admitted for this operation  Past Medical History  Diagnosis Date  . Hypertension   . DVT (deep venous thrombosis) ([place]) 2011    small, developed after venous ablation  . Tinnitus   . Asthma in adult   . Chronic venous insufficiency 2016    severe reflux with painful varicosities sees [facility]  . Melanoma ([place]) 06/29/10    MALIGNANT MELANOMA R SHOULDER/SUPRASCAPULAR BACK s/p interferon chemo and XRT  . History of radiation therapy 07/19/11-08/25/2011    RIGHT AXILLARY REGION/METASTATIC  . History of kidney stones 1980s  . Neuromuscular disorder (HCC)     muscle spasms  . Arthritis   . Seasonal allergies   . OSA (obstructive sleep apnea)     on CPAP  . RLS (restless legs syndrome)   . Anesthesia complication     trouble waking up 2/2 CPAP  . Unspecified vitamin D deficiency 03/18/2013  . Hearing loss sensory, bilateral 2013    mod-severe high freq sensorineural ([facility])  . Cervical spondylosis 2012    Jacelyn Grip, [place])  . Pneumonia   . Kidney stones   . Complication of anesthesia     difficulty coming out due to sleep apnea per pt  . Difficult intubation   . Family history of adverse reaction to anesthesia     O2 levels  drop upon waking     Past Surgical History  Procedure Laterality Date  . Endovenous ablation saphenous vein w/ laser Left 2010    GSV  . Lumbar laminectomy  2001    L4/5  . Deep axillary sentinel node biopsy / excision  2012    RIGHT  . Portacath placement      left subclavian  . Breast surgery Right 1990    BX   . Dilation and curettage of uterus  2010  . Port-a-cath removal  12/05/2011    Procedure: REMOVAL PORT-A-CATH;  Surgeon: Rolm Bookbinder, MD;  Location: [facility];  Service: General;  Laterality: N/A;  left port removal  . Breast biopsy Left 2013    BENIGN  . Knee surgery  1985    left  . Melanoma excision  2011    Right shoulder, with sentinel lymph node biopsy  . Dexa  12/2009    WNL  . Cardiovascular stress test  2010    normal stress test, EF 66%  . Abi  2016    WNL  . Endovenous ablation saphenous vein w/ laser Left 2016    accessory branch [facility])    Family History  Problem Relation Age of Onset  . Cancer Mother 27    uterine  . Cancer Cousin     x2, breast  .  CAD Father 59    MI  . Hypertension Father 58  . Diabetes Sister   . Cancer Paternal Grandmother     melanoma, possibly  . Alzheimer's disease Father 67   Social History:  reports that she quit smoking about 49 years ago. Her smoking use included Cigarettes. She has never used smokeless tobacco. She reports that she drinks alcohol. She reports that she does not use illicit drugs.  Allergies:  Allergies  Allergen Reactions  . Sulfa Antibiotics Itching and Swelling    Facial swelling  . Tape Other (See Comments)    Caused blisters - must use paper tape - same reaction from [employer]    Medications Prior to Admission  Medication Sig Dispense Refill  . acetaminophen (TYLENOL ARTHRITIS PAIN) 650 MG CR tablet Take 650 mg by mouth every 8 (eight) hours as needed for pain.    Marland Kitchen aspirin EC 81 MG tablet Take 81 mg by mouth at bedtime. Melanoma prevention    . furosemide  (LASIX) 20 MG tablet Take 20 mg by mouth every other day.    . gabapentin (NEURONTIN) 300 MG capsule Take 300 mg by mouth at bedtime.    Marland Kitchen KLOR-CON M10 10 MEQ tablet TAKE 2 TABLETS BY MOUTH DAILY. 180 tablet 3  . losartan-hydrochlorothiazide (HYZAAR) 50-12.5 MG per tablet TAKE 1 TABLET BY MOUTH DAILY. 30 tablet 3  . montelukast (SINGULAIR) 10 MG tablet Take 10 mg by mouth at bedtime.    . Multiple Vitamin (MULTIVITAMIN WITH MINERALS) TABS tablet Take 1 tablet by mouth daily.    . Polyvinyl Alcohol-Povidone (TEARS PLUS OP) Place 1 drop into both eyes daily as needed (dry eyes/ redness/ burning).    Marland Kitchen PRESCRIPTION MEDICATION CPAP    . PROAIR HFA 108 (90 BASE) MCG/ACT inhaler INHALE 2 PUFFS INTO LUNGS EVERY 6 HOURS AS NEEDED FOR WHEEZING OR SHORTNESS OF BREATH 8.5 Inhaler 3  . Probiotic Product (PROBIOTIC DAILY PO) Take 1 tablet by mouth daily.     . Vitamin D, Ergocalciferol, (DRISDOL) 50000 UNITS CAPS capsule TAKE 1 CAPSULE (50,000 UNITS TOTAL) BY MOUTH EVERY 7 (SEVEN) DAYS. (Patient taking differently: TAKE 1 CAPSULE (50,000 UNITS TOTAL) BY MOUTH ON WEDNESDAYS) 4 capsule 5    No results found for this or any previous visit (from the past 48 hour(s)). No results found.  Review of Systems  Eyes: Negative.   Respiratory: Negative.   Cardiovascular: Negative.   Gastrointestinal: Negative.   Genitourinary: Negative.   Musculoskeletal: Positive for neck pain.  Skin: Negative.   Neurological: Positive for sensory change and weakness.  Psychiatric/Behavioral: Negative.     Blood pressure 121/62, pulse 62, temperature 97.8 F (36.6 C), temperature source Oral, resp. rate 18, height 5\' 3"  (1.6 m), weight 95.936 kg (211 lb 8 oz), SpO2 100 %. Physical Exam  Constitutional: She is oriented to person, place, and time. She appears well-developed and well-nourished.  HENT:  Head: Normocephalic and atraumatic.  Eyes: Conjunctivae are normal. Pupils are equal, round, and reactive to light.  Neck:  Neck supple.  Limited range of motion of the neck turning only 45 left and right flexing extending less than 50% of normal  Cardiovascular: Normal rate and regular rhythm.   Respiratory: Effort normal and breath sounds normal.  GI: Bowel sounds are normal.  Musculoskeletal:  Some rigidity and hyperreflexia in the upper extremities minimal spasticity in lower extremities.  Neurological: She is alert and oriented to person, place, and time.  3+ reflexes in the biceps and  triceps trace patellar reflexes Achilles reflexes are absent  Skin: Skin is warm and dry.  Psychiatric: She has a normal mood and affect. Her behavior is normal. Judgment and thought content normal.     Assessment/Plan Cervical spondylosis with myelopathy C3-C4.  Anterior cervical decompression C3-C4 arthrodesis with structural allograft anterior plate fixation.  Murlean Seelye J 08/22/2015, 10:42 AM

## 2015-08-22 NOTE — Transfer of Care (Signed)
Immediate Anesthesia Transfer of Care Note  Patient: [name]  Procedure(s) Performed: Procedure(s): ANTERIOR CERVICAL DECOMPRESSION FUSION CERVICAL THREE-FOUR (N/A)  Patient Location: PACU  Anesthesia Type:General  Level of Consciousness: awake, alert  and oriented  Airway & Oxygen Therapy: Patient Spontanous Breathing and Patient connected to nasal cannula oxygen  Post-op Assessment: Report given to RN, Post -op Vital signs reviewed and stable and Patient moving all extremities X 4  Post vital signs: Reviewed and stable  Last Vitals:  Filed Vitals:   08/22/15 1300  BP: 118/51  Pulse: 66  Temp:   Resp: 25    Complications: No apparent anesthesia complications

## 2015-08-22 NOTE — Progress Notes (Signed)
Pt has her own nasal pillows. Pt CPAP mask and machine was set up. Pt stated that she does not need help with machine. Stated she will use machine once ready for bed. I set machine up and stated to pt if for any reason need assistance with machine call RN to notify RT and I will come set up NIV for pt. Pt is stable at this time

## 2015-08-22 NOTE — Progress Notes (Signed)
Patient ID: [name], female   DOB: Feb 05, 1946, 69 y.o.   MRN: [id number] Vital signs are stable Motor function appears intact Modest difficulty with swallowing Ambulating Will watch overnight

## 2015-08-23 ENCOUNTER — Encounter (HOSPITAL_COMMUNITY): Payer: Self-pay | Admitting: Neurological Surgery

## 2015-08-23 MED ORDER — OXYCODONE-ACETAMINOPHEN 5-325 MG PO TABS: 1.0000 | ORAL_TABLET | ORAL | Status: DC | PRN

## 2015-08-23 MED ORDER — DIAZEPAM 5 MG PO TABS: 5.0000 mg | ORAL_TABLET | Freq: Four times a day (QID) | ORAL | Status: DC | PRN

## 2015-08-23 NOTE — Discharge Instructions (Signed)
Wound Care °Leave incision open to air. °You may shower. °Do not scrub directly on incision.  °Do not put any creams, lotions, or ointments on incision. °Activity °Walk each and every day, increasing distance each day. °No lifting greater than 5 lbs.  Avoid excessive neck motion. °No driving for 2 weeks; may ride as a passenger locally. °Wear neck brace at all times except when showering.  If provided soft collar, may wear for comfort unless otherwise instructed. °Diet °Resume your normal diet.  °Return to Work °Will be discussed at you follow up appointment. °Call Your Doctor If Any of These Occur °Redness, drainage, or swelling at the wound.  °Temperature greater than 101 degrees. °Severe pain not relieved by pain medication. °Increased difficulty swallowing. °Incision starts to come apart. °Follow Up Appt °Call today for appointment in 4 weeks ([phone]) or for problems.  If you have any hardware placed in your spine, you will need an x-ray before your appointment. °

## 2015-08-23 NOTE — Progress Notes (Signed)
Physical Therapy Treatment Patient Details Name: [name] MRN: [id number] DOB: [name] 16, 1946 Today's Date: 08/23/2015    History of Present Illness Pt is a 69 yo female admitted s/p C3-4 anterior cervical discectomy decompression. PMH includes: HTN, asthma, DVT, OSA, melanoma, former smoker, HOH, back surg, L knee surg    PT Comments    Pt admitted with above diagnosis. Pt currently with functional limitations due to the deficits listed below (see PT Problem List). At the time of PT eval pt was able to perform transfers and ambulation grossly at a min guard assist level. Pt reports difficulty maintaining upright posture prior to surgery and states that often her head fell forward due to weakness. Feel this patient would benefit from continued therapy services at the outpatient level to improve muscle imbalance in the cervical/thoracic areas, and increase muscular endurance for improved posture. Pt will benefit from skilled PT to increase their independence and safety with mobility to allow discharge to the venue listed below.     Follow Up Recommendations  Outpatient PT;Supervision for mobility/OOB (When appropriate per post-op protocol)     Equipment Recommendations  None recommended by PT    Recommendations for Other Services       Precautions / Restrictions Precautions Precautions: Cervical;Fall Precaution Comments: Reviewed precautions handout Required Braces or Orthoses: Cervical Brace Cervical Brace: Soft collar;For comfort Restrictions Weight Bearing Restrictions: No    Mobility  Bed Mobility Overal bed mobility: Needs Assistance Bed Mobility: Supine to Sit     Supine to sit: Supervision;HOB elevated     General bed mobility comments: Pt was sitting up in recliner upon PT arrival.   Transfers Overall transfer level: Needs assistance Equipment used: None Transfers: Sit to/from Stand Sit to Stand: Min guard         General transfer comment: VC's for hand  placement on seated surface for safety. Pt was able to schieve full stand without any noted unsteadiness.   Ambulation/Gait Ambulation/Gait assistance: Min guard Ambulation Distance (Feet): 400 Feet Assistive device: None Gait Pattern/deviations: Step-through pattern;Decreased stride length;Trunk flexed Gait velocity: Decreased Gait velocity interpretation: Below normal speed for age/gender General Gait Details: Pt ambulating well without RW for support. VC's for improved posture and for self-monitoring for fatigue.    Stairs Stairs: Yes Stairs assistance: Min guard Stair Management: One rail Right;Alternating pattern;Forwards Number of Stairs: 4 General stair comments: Pt was able to negotiate 4 stairs ascending and descending with no physical assist. Close guard for safety required.   Wheelchair Mobility    Modified Rankin (Stroke Patients Only)       Balance Overall balance assessment: Needs assistance Sitting-balance support: Feet supported;No upper extremity supported Sitting balance-Leahy Scale: Good     Standing balance support: No upper extremity supported;During functional activity Standing balance-Leahy Scale: Fair Standing balance comment: Pt able to stand at sink to perform grooming activity without UE support                    Cognition Arousal/Alertness: Awake/alert Behavior During Therapy: WFL for tasks assessed/performed Overall Cognitive Status: Within Functional Limits for tasks assessed                      Exercises      General Comments General comments (skin integrity, edema, etc.): Pt educated in cervical precautions and body mechanics while engaging in ADLs/IADLs.      Pertinent Vitals/Pain Pain Assessment: 0-10 Pain Score: 5  Pain Location: Neck/throat Pain Descriptors /  Indicators: Operative site guarding;Grimacing Pain Intervention(s): Limited activity within patient's tolerance;Monitored during session;Repositioned     Home Living Family/patient expects to be discharged to:: Private residence Living Arrangements: Alone Available Help at Discharge: Family;Available 24 hours/day;Available PRN/intermittently (Family lives across the driveway from her) Type of Home: Mobile home (RV converted to permanent home) Home Access: Stairs to enter Entrance Stairs-Rails: Can reach both Home Layout: One level Home Equipment: Hand held shower head;Adaptive equipment Additional Comments: Pt lives in an [place] and has 4 steps upon entry as well as steps on backside of RV to be able to access craft workshop. Pt also reports she has cats    Prior Function Level of Independence: Independent          PT Goals (current goals can now be found in the care plan section) Acute Rehab PT Goals Patient Stated Goal: none stated PT Goal Formulation: With patient Time For Goal Achievement: 08/30/15 Potential to Achieve Goals: Good    Frequency  Min 5X/week    PT Plan      Co-evaluation             End of Session Equipment Utilized During Treatment: Gait belt Activity Tolerance: Patient tolerated treatment well Patient left: in chair;with call bell/phone within reach     Time: 0837-0856 PT Time Calculation (min) (ACUTE ONLY): 19 min  Charges:                       G Codes:      Rolinda Roan 08/29/15, 10:33 AM   Rolinda Roan, PT, DPT Acute Rehabilitation Services Pager: [phone]

## 2015-08-23 NOTE — Evaluation (Signed)
Occupational Therapy Evaluation & Discharge Patient Details Name: [name] MRN: [id number] DOB: September 27, 1946 Today's Date: 08/23/2015    History of Present Illness 69 yo female admitted C3-4 anterior cervical discectomy decompression PMH:HTN, asthma, DVT, OSA, melanoma, former smoker, HOH, back surg, L knee surg   Clinical Impression   Pt admitted to hospital due to reason stated above. Pt currently with functional limitiations due to deficits listed below (see OT problem list). Prior to admission pt was independent with ADLs/IADLs. Pt currently requires supervision to min guard assistance for safety with ADLs. Pt was educated in body mechanics and positioning following cervical spine surgery and was able to recall using teach back method. All education complete, no further OT acute needs required, however pt will benefit from supervision intermittent to increase her independence and safety with ADLs/IADLs while maintaining cervical precautions to remain safe at home.    Follow Up Recommendations  Supervision - Intermittent    Equipment Recommendations  None recommended by OT    Recommendations for Other Services       Precautions / Restrictions Precautions Precautions: Cervical;Fall Precaution Comments: Provided pt with cervical precaution handout Required Braces or Orthoses: Cervical Brace (Pt reports MD and nurse stated she does not have to wear) Cervical Brace: Soft collar Restrictions Weight Bearing Restrictions: No      Mobility Bed Mobility Overal bed mobility: Needs Assistance Bed Mobility: Supine to Sit     Supine to sit: Supervision;HOB elevated     General bed mobility comments: Pt educated in log roll technique to assist with bed mobility  Transfers Overall transfer level: Needs assistance Equipment used: Rolling walker (2 wheeled) Transfers: Sit to/from Stand Sit to Stand: Min guard         General transfer comment: verbal cues for hand  placement    Balance Overall balance assessment: Needs assistance Sitting-balance support: No upper extremity supported;Feet supported Sitting balance-Leahy Scale: Good     Standing balance support: No upper extremity supported;During functional activity Standing balance-Leahy Scale: Good Standing balance comment: Pt able to stand at sink to perform grooming activity without UE support                            ADL Overall ADL's : Needs assistance/impaired Eating/Feeding: Independent;Sitting   Grooming: Wash/dry face;Min guard;Standing   Upper Body Bathing: Supervision/ safety;Standing   Lower Body Bathing: Min guard;Sit to/from stand   Upper Body Dressing : Supervision/safety;Sitting   Lower Body Dressing: Min guard;Sit to/from stand   Toilet Transfer: Min guard;Ambulation;RW;Regular Museum/gallery exhibitions officer and Hygiene: Min guard;Sit to/from stand   Tub/ Banker: Walk-in shower;Ambulation;Rolling walker Tub/Shower Transfer Details (indicate cue type and reason): simulated shower transfer with 4" threshold Functional mobility during ADLs: Min guard;Rolling walker General ADL Comments: Pt educated in adaptive equipment to assist with ADL independence, and proper body mechanics during ADL activities.     Vision     Perception     Praxis      Pertinent Vitals/Pain Pain Assessment: 0-10 Pain Score: 5  Pain Location: Neck Pain Descriptors / Indicators: Sore Pain Intervention(s): Monitored during session;Repositioned     Hand Dominance Right   Extremity/Trunk Assessment Upper Extremity Assessment Upper Extremity Assessment: Defer to OT evaluation   Lower Extremity Assessment Lower Extremity Assessment: Defer to PT evaluation   Cervical / Trunk Assessment Cervical / Trunk Assessment: Normal   Communication Communication Communication: No difficulties   Cognition Arousal/Alertness: Awake/alert  Behavior During Therapy:  WFL for tasks assessed/performed Overall Cognitive Status: Within Functional Limits for tasks assessed                     General Comments    Pt educated in cervical precautions and body mechanics while engaging in ADLs/IADLs.    Exercises       Shoulder Instructions      Home Living Family/patient expects to be discharged to:: Private residence Living Arrangements: Alone Available Help at Discharge: Family;Available 24 hours/day;Available PRN/intermittently Type of Home: Mobile home Home Access: Stairs to enter Entrance Stairs-Number of Steps: 4 Entrance Stairs-Rails: Can reach both Home Layout: One level     Bathroom Shower/Tub: Occupational psychologist: Standard Bathroom Accessibility: No   Home Equipment: Hand held shower head;Adaptive equipment Adaptive Equipment: Reacher Additional Comments: Pt lives in an [place] and has 4 steps upon entry as well as steps on backside of RV to be able to access craft workshop. Pt also reports she has cats, educated in pet care to ensure compliance of cervical neck precautions. Pt reports sister lives next door and checks on her frequently, they also have a wireless communication system to help in case of an emergency.      Prior Functioning/Environment Level of Independence: Independent             OT Diagnosis: Generalized weakness;Acute pain   OT Problem List: Impaired balance (sitting and/or standing);Decreased knowledge of use of DME or AE;Decreased knowledge of precautions;Pain   OT Treatment/Interventions:      OT Goals(Current goals can be found in the care plan section) Acute Rehab OT Goals Patient Stated Goal: none stated  OT Frequency:     Barriers to D/C:            Co-evaluation              End of Session Equipment Utilized During Treatment: Gait belt;Rolling walker  Activity Tolerance: Patient tolerated treatment well Patient left: in chair;with call bell/phone within reach    Time: 0742-0813 OT Time Calculation (min): 31 min Charges:  OT General Charges $OT Visit: 1 Procedure OT Evaluation $Initial OT Evaluation Tier I: 1 Procedure OT Treatments $Self Care/Home Management : 8-22 mins G-Codes:    Lin Landsman 09-01-15, 9:10 AM

## 2015-08-23 NOTE — Discharge Summary (Signed)
Physician Discharge Summary  Patient ID: [name] MRN: [id number] DOB/AGE: 13-Mar-1946 69 y.o.  Admit date: 08/22/2015 Discharge date: 08/23/2015  Admission Diagnoses: Cervical spondylosis with myelopathy C3-C4, cervical radiculopathy  Discharge Diagnoses: Cervical spondylosis with myelopathy and radiculopathy C3-C4  Active Problems:   Cervical myelopathy with cervical radiculopathy   Discharged Condition: good  Hospital Course: She was admitted to undergo anterior cervical decompression arthrodesis at C3-C4 which he tolerated well  Consults: None  Significant Diagnostic Studies: None  Treatments: Anterior cervical decompression arthrodesis C3-C4  Discharge Exam: Blood pressure 97/50, pulse 55, temperature 98 F (36.7 C), temperature source Oral, resp. rate 18, height 5\' 3"  (1.6 m), weight 95.936 kg (211 lb 8 oz), SpO2 97 %. Incision is clean and dry motor function is intact in the upper extremities swallowing is intact  Disposition: 01-Home or Self Care  Discharge Instructions    Call MD for:  redness, tenderness, or signs of infection (pain, swelling, redness, odor or green/yellow discharge around incision site)    Complete by:  As directed      Call MD for:  severe uncontrolled pain    Complete by:  As directed      Call MD for:  temperature >100.4    Complete by:  As directed      Diet - low sodium heart healthy    Complete by:  As directed      Discharge instructions    Complete by:  As directed   Okay to shower. Do not apply salves or appointments to incision. No heavy lifting with the upper extremities greater than 15 pounds. May resume driving when not requiring pain medication and patient feels comfortable with doing so.     Increase activity slowly    Complete by:  As directed             Medication List    TAKE these medications        aspirin EC 81 MG tablet  Take 81 mg by mouth at bedtime. Melanoma prevention     diazepam 5 MG tablet  Commonly  known as:  VALIUM  Take 1 tablet (5 mg total) by mouth every 6 (six) hours as needed for anxiety.     furosemide 20 MG tablet  Commonly known as:  LASIX  Take 20 mg by mouth every other day.     gabapentin 300 MG capsule  Commonly known as:  NEURONTIN  Take 300 mg by mouth at bedtime.     KLOR-CON M10 10 MEQ tablet  Generic drug:  potassium chloride  TAKE 2 TABLETS BY MOUTH DAILY.     losartan-hydrochlorothiazide 50-12.5 MG tablet  Commonly known as:  HYZAAR  TAKE 1 TABLET BY MOUTH DAILY.     montelukast 10 MG tablet  Commonly known as:  SINGULAIR  Take 10 mg by mouth at bedtime.     multivitamin with minerals Tabs tablet  Take 1 tablet by mouth daily.     oxyCODONE-acetaminophen 5-325 MG tablet  Commonly known as:  PERCOCET/ROXICET  Take 1-2 tablets by mouth every 4 (four) hours as needed for moderate pain.     PRESCRIPTION MEDICATION  CPAP     PROAIR HFA 108 (90 BASE) MCG/ACT inhaler  Generic drug:  albuterol  INHALE 2 PUFFS INTO LUNGS EVERY 6 HOURS AS NEEDED FOR WHEEZING OR SHORTNESS OF BREATH     PROBIOTIC DAILY PO  Take 1 tablet by mouth daily.     TEARS PLUS OP  Place  1 drop into both eyes daily as needed (dry eyes/ redness/ burning).     TYLENOL ARTHRITIS PAIN 650 MG CR tablet  Generic drug:  acetaminophen  Take 650 mg by mouth every 8 (eight) hours as needed for pain.     Vitamin D (Ergocalciferol) 50000 UNITS Caps capsule  Commonly known as:  DRISDOL  TAKE 1 CAPSULE (50,000 UNITS TOTAL) BY MOUTH EVERY 7 (SEVEN) DAYS.         SignedEarleen Newport 08/23/2015, 1:15 PM

## 2015-08-23 NOTE — Progress Notes (Signed)
Pt and family given D/C instructions with Rx's, verbal understanding was provided. Pt's incision is clean and dry with no sign of infection. Pt's IV was removed prior to D/C. Pt D/C'd home via wheelchair @ 1425 per MD order. Pt is stable @ D/C and has no other needs at this time. Holli Humbles, RN

## 2015-08-25 NOTE — Anesthesia Postprocedure Evaluation (Signed)
  Anesthesia Post-op Note  Patient: [name]  Procedure(s) Performed: Procedure(s): ANTERIOR CERVICAL DECOMPRESSION FUSION CERVICAL THREE-FOUR (N/A)  Patient Location: PACU  Anesthesia Type:General  Level of Consciousness: awake  Airway and Oxygen Therapy: Patient Spontanous Breathing  Post-op Pain: mild  Post-op Assessment: Post-op Vital signs reviewed, Patient's Cardiovascular Status Stable, Respiratory Function Stable, Patent Airway, No signs of Nausea or vomiting and Pain level controlled   LLE Sensation: Full sensation   RLE Sensation: Full sensation      Post-op Vital Signs: Reviewed and stable  Last Vitals:  Filed Vitals:   08/23/15 1220  BP: 97/50  Pulse: 55  Temp: 36.7 C  Resp: 18    Complications: No apparent anesthesia complications

## 2015-09-14 DIAGNOSIS — M4712 Other spondylosis with myelopathy, cervical region: Secondary | ICD-10-CM | POA: Diagnosis not present

## 2015-09-15 ENCOUNTER — Encounter: Payer: Self-pay | Admitting: Family Medicine

## 2015-09-19 ENCOUNTER — Other Ambulatory Visit (HOSPITAL_BASED_OUTPATIENT_CLINIC_OR_DEPARTMENT_OTHER): Payer: Medicare Other

## 2015-09-19 ENCOUNTER — Encounter: Payer: Self-pay | Admitting: Hematology & Oncology

## 2015-09-19 ENCOUNTER — Ambulatory Visit (HOSPITAL_BASED_OUTPATIENT_CLINIC_OR_DEPARTMENT_OTHER): Payer: Medicare Other | Admitting: Hematology & Oncology

## 2015-09-19 VITALS — BP 136/69 | HR 63 | Temp 98.0°F | Resp 18 | Wt 213.0 lb

## 2015-09-19 DIAGNOSIS — C4361 Malignant melanoma of right upper limb, including shoulder: Secondary | ICD-10-CM

## 2015-09-19 DIAGNOSIS — R945 Abnormal results of liver function studies: Secondary | ICD-10-CM

## 2015-09-19 DIAGNOSIS — C779 Secondary and unspecified malignant neoplasm of lymph node, unspecified: Secondary | ICD-10-CM

## 2015-09-19 DIAGNOSIS — C4310 Malignant melanoma of unspecified eyelid, including canthus: Secondary | ICD-10-CM

## 2015-09-19 DIAGNOSIS — C439 Malignant melanoma of skin, unspecified: Secondary | ICD-10-CM

## 2015-09-19 DIAGNOSIS — K7689 Other specified diseases of liver: Secondary | ICD-10-CM

## 2015-09-19 DIAGNOSIS — K769 Liver disease, unspecified: Secondary | ICD-10-CM | POA: Diagnosis not present

## 2015-09-19 LAB — COMPREHENSIVE METABOLIC PANEL (CC13)
ALT: 13 U/L (ref 0–55)
AST: 9 U/L (ref 5–34)
Albumin: 3.7 g/dL (ref 3.5–5.0)
Alkaline Phosphatase: 64 U/L (ref 40–150)
Anion Gap: 7 mEq/L (ref 3–11)
BUN: 18.4 mg/dL (ref 7.0–26.0)
CO2: 31 mEq/L — ABNORMAL HIGH (ref 22–29)
Calcium: 9.7 mg/dL (ref 8.4–10.4)
Chloride: 109 mEq/L (ref 98–109)
Creatinine: 0.8 mg/dL (ref 0.6–1.1)
EGFR: 73 mL/min/{1.73_m2} — ABNORMAL LOW (ref >90)
Glucose: 97 mg/dl (ref 70–140)
Potassium: 4.2 mEq/L (ref 3.5–5.1)
Sodium: 147 mEq/L — ABNORMAL HIGH (ref 136–145)
Total Bilirubin: 0.31 mg/dL (ref 0.20–1.20)
Total Protein: 6.6 g/dL (ref 6.4–8.3)

## 2015-09-19 LAB — CBC WITH DIFFERENTIAL (CANCER CENTER ONLY)
BASO#: 0 10*3/uL (ref 0.0–0.2)
BASO%: 0.2 % (ref 0.0–2.0)
EOS%: 3.7 % (ref 0.0–7.0)
Eosinophils Absolute: 0.2 10*3/uL (ref 0.0–0.5)
HCT: 38.8 % (ref 34.8–46.6)
HGB: 12.5 g/dL (ref 11.6–15.9)
LYMPH#: 1.7 10*3/uL (ref 0.9–3.3)
LYMPH%: 28.4 % (ref 14.0–48.0)
MCH: 30.3 pg (ref 26.0–34.0)
MCHC: 32.2 g/dL (ref 32.0–36.0)
MCV: 94 fL (ref 81–101)
MONO#: 0.5 10*3/uL (ref 0.1–0.9)
MONO%: 7.8 % (ref 0.0–13.0)
NEUT#: 3.5 10*3/uL (ref 1.5–6.5)
NEUT%: 59.9 % (ref 39.6–80.0)
Platelets: 187 10*3/uL (ref 145–400)
RBC: 4.12 10*6/uL (ref 3.70–5.32)
RDW: 12.8 % (ref 11.1–15.7)
WBC: 5.9 10*3/uL (ref 3.9–10.0)

## 2015-09-19 LAB — HEPATITIS C ANTIBODY: HCV Ab: NEGATIVE

## 2015-09-19 LAB — LACTATE DEHYDROGENASE (CC13): LDH: 189 U/L (ref 125–245)

## 2015-09-19 NOTE — Progress Notes (Signed)
Hematology and Oncology Follow Up Visit  [name] [id number] 1945/11/14 69 y.o. 09/19/2015   Principle Diagnosis:  Stage IIIC (T3b N3 M0) melanoma of the right shoulder.  Current Therapy:    Observation     Interim History:  Ms.  [name] is back for followup. She had neck surgery back in October. She had a herniated disc at C4-5. This was removed by [name]. He did a lot of "cleaning up"inside her cervical spine. She improved quite quickly. She had weakness over on the right arm and hand. Thankfully, she saw [name] and he was very expeditious with getting her treated.  Thankfully, noted this is related to her melanoma. I would've been totally worried that he would've found melanoma that could be causing her symptoms. However, there is nothing that wrist showed melanoma to be the problem.  She is looking forward to Thanksgiving and Christmas. A sister from [place] coming down for Thanksgiving.  Otherwise, she is doing quite well. [place] She enjoys the solitude.  [name] had no leg swelling.  She had last mammogram back in January. Everything looked okay.  Overall, her performance status is ECOG 1.  Medications:  Current outpatient prescriptions:  .  acetaminophen (TYLENOL ARTHRITIS PAIN) 650 MG CR tablet, Take 650 mg by mouth every 8 (eight) hours as needed for pain., Disp: , Rfl:  .  aspirin EC 81 MG tablet, Take 81 mg by mouth at bedtime. Melanoma prevention, Disp: , Rfl:  .  diazepam (VALIUM) 5 MG tablet, Take 1 tablet (5 mg total) by mouth every 6 (six) hours as needed for anxiety. (Patient taking differently: Take 5 mg by mouth every 6 (six) hours as needed for anxiety. Taking 1 tablet at night for muscle spasms.), Disp: 40 tablet, Rfl: 0 .  furosemide (LASIX) 20 MG tablet, Take 20 mg by mouth every other day., Disp: , Rfl:  .  ibuprofen (ADVIL,MOTRIN) 200 MG tablet, Take 200 mg by mouth every 4 (four) hours., Disp: , Rfl:  .  KLOR-CON M10 10  MEQ tablet, TAKE 2 TABLETS BY MOUTH DAILY., Disp: 180 tablet, Rfl: 3 .  losartan-hydrochlorothiazide (HYZAAR) 50-12.5 MG per tablet, TAKE 1 TABLET BY MOUTH DAILY., Disp: 30 tablet, Rfl: 3 .  montelukast (SINGULAIR) 10 MG tablet, Take 10 mg by mouth at bedtime., Disp: , Rfl:  .  Multiple Vitamin (MULTIVITAMIN WITH MINERALS) TABS tablet, Take 1 tablet by mouth daily., Disp: , Rfl:  .  Polyvinyl Alcohol-Povidone (TEARS PLUS OP), Place 1 drop into both eyes daily as needed (dry eyes/ redness/ burning)., Disp: , Rfl:  .  PRESCRIPTION MEDICATION, CPAP, Disp: , Rfl:  .  Probiotic Product (PROBIOTIC DAILY PO), Take 1 tablet by mouth daily. , Disp: , Rfl:  .  Vitamin D, Ergocalciferol, (DRISDOL) 50000 UNITS CAPS capsule, TAKE 1 CAPSULE (50,000 UNITS TOTAL) BY MOUTH EVERY 7 (SEVEN) DAYS. (Patient taking differently: TAKE 1 CAPSULE (50,000 UNITS TOTAL) BY MOUTH ON WEDNESDAYS), Disp: 4 capsule, Rfl: 5 .  gabapentin (NEURONTIN) 300 MG capsule, Take 300 mg by mouth at bedtime., Disp: , Rfl:  .  PROAIR HFA 108 (90 BASE) MCG/ACT inhaler, INHALE 2 PUFFS INTO LUNGS EVERY 6 HOURS AS NEEDED FOR WHEEZING OR SHORTNESS OF BREATH (Patient not taking: Reported on 09/19/2015), Disp: 8.5 Inhaler, Rfl: 3  Allergies:  Allergies  Allergen Reactions  . Sulfa Antibiotics Itching and Swelling    Facial swelling  . Tape Other (See Comments)    Caused  blisters - must use paper tape - same reaction from [employer]    Past Medical History, Surgical history, Social history, and Family History were reviewed and updated.  Review of Systems: As above  Physical Exam:  weight is 213 lb (96.616 kg). Her oral temperature is 98 F (36.7 C). Her blood pressure is 136/69 and her pulse is 63. Her respiration is 18.   Portable and well-nourished white female. Head and neck exam is no ocular or oral lesions. She has a healing cervical limits be scar on the anterior neck. There are no palpable cervical or supraclavicular lymph nodes. Lungs  are clear. Cardiac exam regular rate and rhythm with no murmurs rubs or bruits. Abdomen soft. Has good bowel sounds. Is no fluid wave. There is a palpable hepato- splenomegaly. Exam shows a wide local excision scar in the posterior right shoulder region. This well-healed. No tenderness over the spine ribs or hips. Axillary exam shows no bilateral axillary adenopathy. Extremities shows no clubbing cyanosis or edema. She has a bandage on the left lower leg in the upper portion. She has a compression stocking on the left leg. She has decent pulses in her distal extremities. There is mild lymphedema noted in the right arm. Skin exam shows no suspicious skin lesions.  Lab Results  Component Value Date   WBC 5.9 09/19/2015   HGB 12.5 09/19/2015   HCT 38.8 09/19/2015   MCV 94 09/19/2015   PLT 187 09/19/2015     Chemistry      Component Value Date/Time   NA 139 08/18/2015 1430   NA 147* 09/17/2014 1013   NA 146 10/21/2012   K 3.9 08/18/2015 1430   K 3.8 09/17/2014 1013   CL 101 08/18/2015 1430   CL 104 09/17/2014 1013   CO2 28 08/18/2015 1430   CO2 27 09/17/2014 1013   BUN 16 08/18/2015 1430   BUN 21 09/17/2014 1013   BUN 18 10/21/2012   CREATININE 0.79 08/18/2015 1430   CREATININE 1.0 09/17/2014 1013      Component Value Date/Time   CALCIUM 9.7 08/18/2015 1430   CALCIUM 9.3 09/17/2014 1013   ALKPHOS 63 03/18/2015 1000   ALKPHOS 49 09/17/2014 1013   AST 10 03/18/2015 1000   AST 16 09/17/2014 1013   ALT 15 03/18/2015 1000   ALT 21 09/17/2014 1013   BILITOT 0.5 03/18/2015 1000   BILITOT 0.60 09/17/2014 1013         Impression and Plan: [name] is a 69 year old white female with stage IIIc melanoma of the right shoulder. She did undergo resection. She had 8 positive lymph nodes. She completed adjuvant interferon therapy in December of 2012. She had radiation therapy to the right axilla.  I see no evidence of recurrence. However, she is at significant risk for recurrence. She  worries about recurrence.  I'm glad that her neck got fixed. She is doing much better with this. She still has little bit of tingling in the right hand but no weakness.  She is worried about hepatitis C. She sees the ads on TV. We will go ahead and check her for this.  We will plan to get her back in another 6 months.   [name], MD 11/14/201611:22 AM

## 2015-09-20 ENCOUNTER — Telehealth: Payer: Self-pay | Admitting: Nurse Practitioner

## 2015-09-20 NOTE — Telephone Encounter (Addendum)
LVM on pts personal VM and informed her to contact office with questions.   ----- Message from [name], MD sent at 09/20/2015  7:32 AM EST ----- Call - Hepatitis C is NEGATIVE.  [name]

## 2015-09-23 ENCOUNTER — Ambulatory Visit (INDEPENDENT_AMBULATORY_CARE_PROVIDER_SITE_OTHER): Payer: Medicare Other | Admitting: Family Medicine

## 2015-09-23 ENCOUNTER — Encounter: Payer: Self-pay | Admitting: Family Medicine

## 2015-09-23 VITALS — BP 130/80 | HR 68 | Temp 98.2°F | Ht 62.2 in | Wt 212.5 lb

## 2015-09-23 DIAGNOSIS — G959 Disease of spinal cord, unspecified: Secondary | ICD-10-CM

## 2015-09-23 DIAGNOSIS — Z0000 Encounter for general adult medical examination without abnormal findings: Secondary | ICD-10-CM

## 2015-09-23 DIAGNOSIS — I10 Essential (primary) hypertension: Secondary | ICD-10-CM

## 2015-09-23 DIAGNOSIS — E2839 Other primary ovarian failure: Secondary | ICD-10-CM

## 2015-09-23 DIAGNOSIS — I872 Venous insufficiency (chronic) (peripheral): Secondary | ICD-10-CM

## 2015-09-23 DIAGNOSIS — Z7189 Other specified counseling: Secondary | ICD-10-CM

## 2015-09-23 DIAGNOSIS — I83023 Varicose veins of left lower extremity with ulcer of ankle: Secondary | ICD-10-CM

## 2015-09-23 DIAGNOSIS — L97329 Non-pressure chronic ulcer of left ankle with unspecified severity: Secondary | ICD-10-CM

## 2015-09-23 DIAGNOSIS — M4712 Other spondylosis with myelopathy, cervical region: Secondary | ICD-10-CM

## 2015-09-23 DIAGNOSIS — M5412 Radiculopathy, cervical region: Secondary | ICD-10-CM

## 2015-09-23 DIAGNOSIS — C4310 Malignant melanoma of unspecified eyelid, including canthus: Secondary | ICD-10-CM

## 2015-09-23 DIAGNOSIS — G4733 Obstructive sleep apnea (adult) (pediatric): Secondary | ICD-10-CM

## 2015-09-23 DIAGNOSIS — I83892 Varicose veins of left lower extremities with other complications: Secondary | ICD-10-CM

## 2015-09-23 NOTE — Assessment & Plan Note (Signed)
Now off gabapentin. Feels ACDF surgery has helped leg pain more than anything. No recent venous ulcer. Persistent pedal edema.

## 2015-09-23 NOTE — Assessment & Plan Note (Addendum)
Advanced directives: doesn't want prolonged life support. Unsure who would be HCPOA. Packet provided last visit, doesn't want to complete forms now.

## 2015-09-23 NOTE — Assessment & Plan Note (Signed)
This has healed. Stable legs since cervical neck surgery.

## 2015-09-23 NOTE — Assessment & Plan Note (Signed)
Appreciate derm/onc care of patient.

## 2015-09-23 NOTE — Assessment & Plan Note (Signed)
S/p ACDF ([facility])

## 2015-09-23 NOTE — Assessment & Plan Note (Signed)
Chronic, stable. Continue current regimen. 

## 2015-09-23 NOTE — Assessment & Plan Note (Signed)
Appreciate [facility] care.  

## 2015-09-23 NOTE — Assessment & Plan Note (Signed)
I have personally reviewed the Medicare Annual Wellness questionnaire and have noted 1. The patient's medical and social history 2. Their use of alcohol, tobacco or illicit drugs 3. Their current medications and supplements 4. The patient's functional ability including ADL's, fall risks, home safety risks and hearing or visual impairment. Cognitive function has been assessed and addressed as indicated.  5. Diet and physical activity 6. Evidence for depression or mood disorders The patients weight, height, BMI have been recorded in the chart. I have made referrals, counseling and provided education to the patient based on review of the above and I have provided the pt with a written personalized care plan for preventive services. Provider list updated.. See scanned questionairre as needed for further documentation. Reviewed preventative protocols and updated unless pt declined.  

## 2015-09-23 NOTE — Patient Instructions (Addendum)
Schedule eye exam when you can. Sign release of records for colonoscopy [facility] [place] Dr Margart Sickles We will schedule repeat bone density scan.  Mammogram will be due 11/2015.  Good to see you today, call us with questions. Return as needed or in 6 months for follow up  Health Maintenance, Female Adopting a healthy lifestyle and getting preventive care can go a long way to promote health and wellness. Talk with your health care provider about what schedule of regular examinations is right for you. This is a good chance for you to check in with your provider about disease prevention and staying healthy. In between checkups, there are plenty of things you can do on your own. Experts have done a lot of research about which lifestyle changes and preventive measures are most likely to keep you healthy. Ask your health care provider for more information. WEIGHT AND DIET  Eat a healthy diet  Be sure to include plenty of vegetables, fruits, low-fat dairy products, and lean protein.  Do not eat a lot of foods high in solid fats, added sugars, or salt.  Get regular exercise. This is one of the most important things you can do for your health.  Most adults should exercise for at least 150 minutes each week. The exercise should increase your heart rate and make you sweat (moderate-intensity exercise).  Most adults should also do strengthening exercises at least twice a week. This is in addition to the moderate-intensity exercise.  Maintain a healthy weight  Body mass index (BMI) is a measurement that can be used to identify possible weight problems. It estimates body fat based on height and weight. Your health care provider can help determine your BMI and help you achieve or maintain a healthy weight.  For females 19 years of age and older:   A BMI below 18.5 is considered underweight.  A BMI of 18.5 to 24.9 is normal.  A BMI of 25 to 29.9 is considered overweight.  A BMI of 30 and above is  considered obese.  Watch levels of cholesterol and blood lipids  You should start having your blood tested for lipids and cholesterol at 69 years of age, then have this test every 5 years.  You may need to have your cholesterol levels checked more often if:  Your lipid or cholesterol levels are high.  You are older than 69 years of age.  You are at high risk for heart disease.  CANCER SCREENING   Lung Cancer  Lung cancer screening is recommended for adults 4-97 years old who are at high risk for lung cancer because of a history of smoking.  A yearly low-dose CT scan of the lungs is recommended for people who:  Currently smoke.  Have quit within the past 15 years.  Have at least a 30-pack-year history of smoking. A pack year is smoking an average of one pack of cigarettes a day for 1 year.  Yearly screening should continue until it has been 15 years since you quit.  Yearly screening should stop if you develop a health problem that would prevent you from having lung cancer treatment.  Breast Cancer  Practice breast self-awareness. This means understanding how your breasts normally appear and feel.  It also means doing regular breast self-exams. Let your health care provider know about any changes, no matter how small.  If you are in your 20s or 30s, you should have a clinical breast exam (CBE) by a health care provider every 1-3  years as part of a regular health exam.  If you are 80 or older, have a CBE every year. Also consider having a breast X-ray (mammogram) every year.  If you have a family history of breast cancer, talk to your health care provider about genetic screening.  If you are at high risk for breast cancer, talk to your health care provider about having an MRI and a mammogram every year.  Breast cancer gene (BRCA) assessment is recommended for women who have family members with BRCA-related cancers. BRCA-related cancers  include:  Breast.  Ovarian.  Tubal.  Peritoneal cancers.  Results of the assessment will determine the need for genetic counseling and BRCA1 and BRCA2 testing. Cervical Cancer Your health care provider may recommend that you be screened regularly for cancer of the pelvic organs (ovaries, uterus, and vagina). This screening involves a pelvic examination, including checking for microscopic changes to the surface of your cervix (Pap test). You may be encouraged to have this screening done every 3 years, beginning at age 21.  For women ages 22-65, health care providers may recommend pelvic exams and Pap testing every 3 years, or they may recommend the Pap and pelvic exam, combined with testing for human papilloma virus (HPV), every 5 years. Some types of HPV increase your risk of cervical cancer. Testing for HPV may also be done on women of any age with unclear Pap test results.  Other health care providers may not recommend any screening for nonpregnant women who are considered low risk for pelvic cancer and who do not have symptoms. Ask your health care provider if a screening pelvic exam is right for you.  If you have had past treatment for cervical cancer or a condition that could lead to cancer, you need Pap tests and screening for cancer for at least 20 years after your treatment. If Pap tests have been discontinued, your risk factors (such as having a new sexual partner) need to be reassessed to determine if screening should resume. Some women have medical problems that increase the chance of getting cervical cancer. In these cases, your health care provider may recommend more frequent screening and Pap tests. Colorectal Cancer  This type of cancer can be detected and often prevented.  Routine colorectal cancer screening usually begins at 69 years of age and continues through 69 years of age.  Your health care provider may recommend screening at an earlier age if you have risk factors for  colon cancer.  Your health care provider may also recommend using home test kits to check for hidden blood in the stool.  A small camera at the end of a tube can be used to examine your colon directly (sigmoidoscopy or colonoscopy). This is done to check for the earliest forms of colorectal cancer.  Routine screening usually begins at age 7.  Direct examination of the colon should be repeated every 5-10 years through 69 years of age. However, you may need to be screened more often if early forms of precancerous polyps or small growths are found. Skin Cancer  Check your skin from head to toe regularly.  Tell your health care provider about any new moles or changes in moles, especially if there is a change in a mole's shape or color.  Also tell your health care provider if you have a mole that is larger than the size of a pencil eraser.  Always use sunscreen. Apply sunscreen liberally and repeatedly throughout the day.  Protect yourself by wearing long  sleeves, pants, a wide-brimmed hat, and sunglasses whenever you are outside. HEART DISEASE, DIABETES, AND HIGH BLOOD PRESSURE   High blood pressure causes heart disease and increases the risk of stroke. High blood pressure is more likely to develop in:  People who have blood pressure in the high end of the normal range (130-139/85-89 mm Hg).  People who are overweight or obese.  People who are African American.  If you are 61-23 years of age, have your blood pressure checked every 3-5 years. If you are 72 years of age or older, have your blood pressure checked every year. You should have your blood pressure measured twice--once when you are at a hospital or clinic, and once when you are not at a hospital or clinic. Record the average of the two measurements. To check your blood pressure when you are not at a hospital or clinic, you can use:  An automated blood pressure machine at a pharmacy.  A home blood pressure monitor.  If you  are between 59 years and 56 years old, ask your health care provider if you should take aspirin to prevent strokes.  Have regular diabetes screenings. This involves taking a blood sample to check your fasting blood sugar level.  If you are at a normal weight and have a low risk for diabetes, have this test once every three years after 69 years of age.  If you are overweight and have a high risk for diabetes, consider being tested at a younger age or more often. PREVENTING INFECTION  Hepatitis B  If you have a higher risk for hepatitis B, you should be screened for this virus. You are considered at high risk for hepatitis B if:  You were born in a country where hepatitis B is common. Ask your health care provider which countries are considered high risk.  Your parents were born in a high-risk country, and you have not been immunized against hepatitis B (hepatitis B vaccine).  You have HIV or AIDS.  You use needles to inject street drugs.  You live with someone who has hepatitis B.  You have had sex with someone who has hepatitis B.  You get hemodialysis treatment.  You take certain medicines for conditions, including cancer, organ transplantation, and autoimmune conditions. Hepatitis C  Blood testing is recommended for:  Everyone born from [birth year] through 1965.  Anyone with known risk factors for hepatitis C. Sexually transmitted infections (STIs)  You should be screened for sexually transmitted infections (STIs) including gonorrhea and chlamydia if:  You are sexually active and are younger than 69 years of age.  You are older than 69 years of age and your health care provider tells you that you are at risk for this type of infection.  Your sexual activity has changed since you were last screened and you are at an increased risk for chlamydia or gonorrhea. Ask your health care provider if you are at risk.  If you do not have HIV, but are at risk, it may be recommended that you  take a prescription medicine daily to prevent HIV infection. This is called pre-exposure prophylaxis (PrEP). You are considered at risk if:  You are sexually active and do not regularly use condoms or know the HIV status of your partner(s).  You take drugs by injection.  You are sexually active with a partner who has HIV. Talk with your health care provider about whether you are at high risk of being infected with HIV. If you choose  to begin PrEP, you should first be tested for HIV. You should then be tested every 3 months for as long as you are taking PrEP.  PREGNANCY   If you are premenopausal and you may become pregnant, ask your health care provider about preconception counseling.  If you may become pregnant, take 400 to 800 micrograms (mcg) of folic acid every day.  If you want to prevent pregnancy, talk to your health care provider about birth control (contraception). OSTEOPOROSIS AND MENOPAUSE   Osteoporosis is a disease in which the bones lose minerals and strength with aging. This can result in serious bone fractures. Your risk for osteoporosis can be identified using a bone density scan.  If you are 23 years of age or older, or if you are at risk for osteoporosis and fractures, ask your health care provider if you should be screened.  Ask your health care provider whether you should take a calcium or vitamin D supplement to lower your risk for osteoporosis.  Menopause may have certain physical symptoms and risks.  Hormone replacement therapy may reduce some of these symptoms and risks. Talk to your health care provider about whether hormone replacement therapy is right for you.  HOME CARE INSTRUCTIONS   Schedule regular health, dental, and eye exams.  Stay current with your immunizations.   Do not use any tobacco products including cigarettes, chewing tobacco, or electronic cigarettes.  If you are pregnant, do not drink alcohol.  If you are breastfeeding, limit how  much and how often you drink alcohol.  Limit alcohol intake to no more than 1 drink per day for nonpregnant women. One drink equals 12 ounces of beer, 5 ounces of wine, or 1 ounces of hard liquor.  Do not use street drugs.  Do not share needles.  Ask your health care provider for help if you need support or information about quitting drugs.  Tell your health care provider if you often feel depressed.  Tell your health care provider if you have ever been abused or do not feel safe at home.   This information is not intended to replace advice given to you by your health care provider. Make sure you discuss any questions you have with your health care provider.   Document Released: 05/07/2011 Document Revised: 11/12/2014 Document Reviewed: 09/23/2013 Elsevier Interactive Patient Education [employer].

## 2015-09-23 NOTE — Progress Notes (Signed)
Pre visit review using our clinic review tool, if applicable. No additional management support is needed unless otherwise documented below in the visit note. 

## 2015-09-23 NOTE — Progress Notes (Signed)
BP 130/80 mmHg  Pulse 68  Temp(Src) 98.2 F (36.8 C) (Oral)  Ht 5' 2.2" (1.58 m)  Wt 212 lb 8 oz (96.389 kg)  BMI 38.61 kg/m2   CC: medicare wellness visit  Subjective:    Patient ID: [name], female    DOB: [birth year], 69 y.o.   MRN: [id number]  HPI: [name] is a 69 y.o. female presenting on 09/23/2015 for Annual Exam   This year has struggled with lower extremity venous insufficiency with venous stasis ulcers. Has had several vein procedures to lower legs by vascular surgeons.  Recent ACDF by Dr Ellene Route and healing well.   Hearing screen passed today.H/o tinnitus.  Failed vision screen - last saw eye doctor 2010. Denies falls, depression,anhedonia.   Preventative: Colonoscopy 2007 per pt - no records of this.Told good for 10 years. Declines stool kit today. Well woman exam with OBGYN (Dr. [name]). To see local OBGYN 11/30. Mammogram 11/2014 birads2 DEXA Date: 12/2009 WNL. Would like rpt after cancer treatment.  Flu shot - yearly Tetanus unsure Pneumovax 05/2013 , prevnar 09/2014 zostavax - did not do - too expensive. Advanced directives: doesn't want prolonged life support. Unsure who would be HCPOA. Packet provided last visit, doesn't want to complete forms now.  Seat belt use discussed Sunscreen use discussed. No changing moles on skin. Sees derm yearly (h/o melanoma).  Lives alone. Sister and husband live next door  Lives in [place].  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  Edu: some college Activity: no regular exercise Diet: poor - good water, rare fruits/vegetables  Relevant past medical, surgical, family and social history reviewed and updated as indicated. Interim medical history since our last visit reviewed. Allergies and medications reviewed and updated. Current Outpatient Prescriptions on File Prior to Visit  Medication Sig  . aspirin EC 81 MG tablet Take 81 mg by mouth at bedtime. Melanoma prevention  .  diazepam (VALIUM) 5 MG tablet Take 1 tablet (5 mg total) by mouth every 6 (six) hours as needed for anxiety. (Patient taking differently: Take 5 mg by mouth every 6 (six) hours as needed for anxiety. Taking 1 tablet at night for muscle spasms.)  . furosemide (LASIX) 20 MG tablet Take 20 mg by mouth every other day.  . ibuprofen (ADVIL,MOTRIN) 200 MG tablet Take 200 mg by mouth every 4 (four) hours.  Marland Kitchen KLOR-CON M10 10 MEQ tablet TAKE 2 TABLETS BY MOUTH DAILY.  Marland Kitchen losartan-hydrochlorothiazide (HYZAAR) 50-12.5 MG per tablet TAKE 1 TABLET BY MOUTH DAILY.  . montelukast (SINGULAIR) 10 MG tablet Take 10 mg by mouth at bedtime.  . Multiple Vitamin (MULTIVITAMIN WITH MINERALS) TABS tablet Take 1 tablet by mouth daily.  . Polyvinyl Alcohol-Povidone (TEARS PLUS OP) Place 1 drop into both eyes daily as needed (dry eyes/ redness/ burning).  Marland Kitchen PRESCRIPTION MEDICATION CPAP  . PROAIR HFA 108 (90 BASE) MCG/ACT inhaler INHALE 2 PUFFS INTO LUNGS EVERY 6 HOURS AS NEEDED FOR WHEEZING OR SHORTNESS OF BREATH  . Probiotic Product (PROBIOTIC DAILY PO) Take 1 tablet by mouth daily.   . Vitamin D, Ergocalciferol, (DRISDOL) 50000 UNITS CAPS capsule TAKE 1 CAPSULE (50,000 UNITS TOTAL) BY MOUTH EVERY 7 (SEVEN) DAYS. (Patient taking differently: TAKE 1 CAPSULE (50,000 UNITS TOTAL) BY MOUTH ON WEDNESDAYS)  . acetaminophen (TYLENOL ARTHRITIS PAIN) 650 MG CR tablet Take 650 mg by mouth every 8 (eight) hours as needed for pain.   No current facility-administered medications on file prior to visit.  Review of Systems Per HPI unless specifically indicated in ROS section     Objective:    BP 130/80 mmHg  Pulse 68  Temp(Src) 98.2 F (36.8 C) (Oral)  Ht 5' 2.2" (1.58 m)  Wt 212 lb 8 oz (96.389 kg)  BMI 38.61 kg/m2  Wt Readings from Last 3 Encounters:  09/23/15 212 lb 8 oz (96.389 kg)  09/19/15 213 lb (96.616 kg)  08/22/15 211 lb 8 oz (95.936 kg)    Physical Exam  Constitutional: She is oriented to person, place, and  time. She appears well-developed and well-nourished. No distress.  HENT:  Head: Normocephalic and atraumatic.  Right Ear: Hearing, tympanic membrane, external ear and ear canal normal.  Left Ear: Hearing, tympanic membrane, external ear and ear canal normal.  Nose: Nose normal.  Mouth/Throat: Uvula is midline, oropharynx is clear and moist and mucous membranes are normal. No oropharyngeal exudate, posterior oropharyngeal edema or posterior oropharyngeal erythema.  Eyes: Conjunctivae and EOM are normal. Pupils are equal, round, and reactive to light. No scleral icterus.  Neck: Normal range of motion. Neck supple. Carotid bruit is not present. No thyromegaly present.  Cardiovascular: Normal rate, regular rhythm, normal heart sounds and intact distal pulses.   No murmur heard. Pulses:      Radial pulses are 2+ on the right side, and 2+ on the left side.  Pulmonary/Chest: Effort normal and breath sounds normal. No respiratory distress. She has no wheezes. She has no rales.  Abdominal: Soft. Bowel sounds are normal. She exhibits no distension and no mass. There is no tenderness. There is no rebound and no guarding.  Musculoskeletal: Normal range of motion. She exhibits no edema.  Lymphadenopathy:    She has no cervical adenopathy.  Neurological: She is alert and oriented to person, place, and time.  CN grossly intact, station and gait intact Recall 3/3 Calculation D-L-R-O-W 5/5  Skin: Skin is warm and dry. No rash noted.  Psychiatric: She has a normal mood and affect. Her behavior is normal. Judgment and thought content normal.  Nursing note and vitals reviewed.  Results for orders placed or performed in visit on 09/19/15  CBC with Differential [facility])  Result Value Ref Range   WBC 5.9 3.9 - 10.0 10e3/uL   RBC 4.12 3.70 - 5.32 10e6/uL   HGB 12.5 11.6 - 15.9 g/dL   HCT 38.8 34.8 - 46.6 %   MCV 94 81 - 101 fL   MCH 30.3 26.0 - 34.0 pg   MCHC 32.2 32.0 - 36.0 g/dL   RDW 12.8 11.1  - 15.7 %   Platelets 187 145 - 400 10e3/uL   NEUT# 3.5 1.5 - 6.5 10e3/uL   LYMPH# 1.7 0.9 - 3.3 10e3/uL   MONO# 0.5 0.1 - 0.9 10e3/uL   Eosinophils Absolute 0.2 0.0 - 0.5 10e3/uL   BASO# 0.0 0.0 - 0.2 10e3/uL   NEUT% 59.9 39.6 - 80.0 %   LYMPH% 28.4 14.0 - 48.0 %   MONO% 7.8 0.0 - 13.0 %   EOS% 3.7 0.0 - 7.0 %   BASO% 0.2 0.0 - 2.0 %  Comprehensive metabolic panel  Result Value Ref Range   Sodium 147 (H) 136 - 145 mEq/L   Potassium 4.2 3.5 - 5.1 mEq/L   Chloride 109 98 - 109 mEq/L   CO2 31 (H) 22 - 29 mEq/L   Glucose 97 70 - 140 mg/dl   BUN 18.4 7.0 - 26.0 mg/dL   Creatinine 0.8 0.6 - 1.1 mg/dL  Total Bilirubin 0.31 0.20 - 1.20 mg/dL   Alkaline Phosphatase 64 40 - 150 U/L   AST 9 5 - 34 U/L   ALT 13 0 - 55 U/L   Total Protein 6.6 6.4 - 8.3 g/dL   Albumin 3.7 3.5 - 5.0 g/dL   Calcium 9.7 8.4 - 10.4 mg/dL   Anion Gap 7 3 - 11 mEq/L   EGFR 73 (L) >90 ml/min/1.73 m2  Lactate dehydrogenase  Result Value Ref Range   LDH 189 125 - 245 U/L  Hepatitis C antibody  Result Value Ref Range   HCV Ab NEGATIVE NEGATIVE      Assessment & Plan:   Problem List Items Addressed This Visit    RESOLVED: Venous stasis ulcer of ankle ([place])    This has healed. Stable legs since cervical neck surgery.      Varicose veins of left lower extremity with complications    Appreciate VVS care.       OSA (obstructive sleep apnea)    Continue CPAP per pulm [facility])      Melanoma [facility])    Appreciate derm/onc care of patient.      Medicare annual wellness visit, subsequent - Primary    I have personally reviewed the Medicare Annual Wellness questionnaire and have noted 1. The patient's medical and social history 2. Their use of alcohol, tobacco or illicit drugs 3. Their current medications and supplements 4. The patient's functional ability including ADL's, fall risks, home safety risks and hearing or visual impairment. Cognitive function has been assessed and addressed as indicated.  5. Diet  and physical activity 6. Evidence for depression or mood disorders The patients weight, height, BMI have been recorded in the chart. I have made referrals, counseling and provided education to the patient based on review of the above and I have provided the pt with a written personalized care plan for preventive services. Provider list updated.. See scanned questionairre as needed for further documentation. Reviewed preventative protocols and updated unless pt declined.       Hypertension    Chronic, stable. Continue current regimen.      Chronic venous insufficiency    Now off gabapentin. Feels ACDF surgery has helped leg pain more than anything. No recent venous ulcer. Persistent pedal edema.      Cervical myelopathy with cervical radiculopathy    S/p ACDF ([place])      Advanced care planning/counseling discussion    Advanced directives: doesn't want prolonged life support. Unsure who would be HCPOA. Packet provided last visit, doesn't want to complete forms now.        Other Visit Diagnoses    Estrogen deficiency        Relevant Orders    DG Bone Density        Follow up plan: Return in about 6 months (around 03/22/2016), or as needed, for follow up visit.

## 2015-09-23 NOTE — Assessment & Plan Note (Signed)
Continue CPAP per pulm [facility])

## 2015-09-27 ENCOUNTER — Other Ambulatory Visit: Payer: Self-pay | Admitting: *Deleted

## 2015-09-27 ENCOUNTER — Encounter: Payer: Self-pay | Admitting: *Deleted

## 2015-09-27 DIAGNOSIS — R945 Abnormal results of liver function studies: Secondary | ICD-10-CM

## 2015-09-27 DIAGNOSIS — C4310 Malignant melanoma of unspecified eyelid, including canthus: Secondary | ICD-10-CM

## 2015-09-27 MED ORDER — IBUPROFEN 200 MG PO TABS: 200.0000 mg | ORAL_TABLET | ORAL | Status: DC

## 2015-09-28 ENCOUNTER — Other Ambulatory Visit: Payer: Self-pay | Admitting: Family Medicine

## 2015-10-05 ENCOUNTER — Encounter: Payer: Medicare Other | Admitting: Family Medicine

## 2015-10-16 ENCOUNTER — Encounter: Payer: Self-pay | Admitting: Family Medicine

## 2015-11-06 ENCOUNTER — Other Ambulatory Visit: Payer: Self-pay | Admitting: Family Medicine

## 2015-11-09 ENCOUNTER — Encounter: Payer: Self-pay | Admitting: Podiatry

## 2015-11-09 ENCOUNTER — Ambulatory Visit (INDEPENDENT_AMBULATORY_CARE_PROVIDER_SITE_OTHER): Payer: Medicare Other | Admitting: Podiatry

## 2015-11-09 DIAGNOSIS — B351 Tinea unguium: Secondary | ICD-10-CM

## 2015-11-09 DIAGNOSIS — Q828 Other specified congenital malformations of skin: Secondary | ICD-10-CM | POA: Diagnosis not present

## 2015-11-09 DIAGNOSIS — M79676 Pain in unspecified toe(s): Secondary | ICD-10-CM

## 2015-11-09 NOTE — Progress Notes (Signed)
She presents today with chief complaint of painful elongated toenails with corns and calluses.  Objective: Vital signs are stable alert and oriented 3. Pulses are palpable bilateral. Toenails are thick yellow dystrophic clinic mycotic painful palpation sharp incurvated nail margins. She also has reactive hyperkeratosis plantar aspect of the bilateral foot.  Assessment: Pain in limb secondary to onychomycosis and porokeratosis.  Plan: Debridement of toenails and all reactive hyperkeratosis bilateral. Follow up with her in a few months.

## 2015-11-10 DIAGNOSIS — M4712 Other spondylosis with myelopathy, cervical region: Secondary | ICD-10-CM | POA: Diagnosis not present

## 2015-11-11 ENCOUNTER — Other Ambulatory Visit: Payer: Self-pay | Admitting: Neurological Surgery

## 2015-11-11 DIAGNOSIS — M47812 Spondylosis without myelopathy or radiculopathy, cervical region: Secondary | ICD-10-CM

## 2015-11-16 ENCOUNTER — Encounter: Payer: Self-pay | Admitting: *Deleted

## 2015-11-17 ENCOUNTER — Ambulatory Visit
Admission: RE | Admit: 2015-11-17 | Discharge: 2015-11-17 | Disposition: A | Discharge status: Home / Self Care | Payer: Medicare Other | Source: Ambulatory Visit | Attending: Neurological Surgery | Admitting: Neurological Surgery

## 2015-11-17 DIAGNOSIS — M50323 Other cervical disc degeneration at C6-C7 level: Secondary | ICD-10-CM | POA: Diagnosis not present

## 2015-11-17 DIAGNOSIS — M50321 Other cervical disc degeneration at C4-C5 level: Secondary | ICD-10-CM | POA: Diagnosis not present

## 2015-11-17 DIAGNOSIS — M47812 Spondylosis without myelopathy or radiculopathy, cervical region: Secondary | ICD-10-CM

## 2015-11-17 DIAGNOSIS — M50322 Other cervical disc degeneration at C5-C6 level: Secondary | ICD-10-CM | POA: Diagnosis not present

## 2015-11-24 DIAGNOSIS — M40202 Unspecified kyphosis, cervical region: Secondary | ICD-10-CM | POA: Diagnosis not present

## 2015-11-26 DIAGNOSIS — M40202 Unspecified kyphosis, cervical region: Secondary | ICD-10-CM | POA: Diagnosis not present

## 2015-11-26 DIAGNOSIS — M5021 Other cervical disc displacement,  high cervical region: Secondary | ICD-10-CM | POA: Diagnosis not present

## 2015-12-07 DIAGNOSIS — M4013 Other secondary kyphosis, cervicothoracic region: Secondary | ICD-10-CM | POA: Diagnosis not present

## 2015-12-07 DIAGNOSIS — M40202 Unspecified kyphosis, cervical region: Secondary | ICD-10-CM | POA: Diagnosis not present

## 2015-12-31 ENCOUNTER — Encounter: Payer: Self-pay | Admitting: Family Medicine

## 2015-12-31 ENCOUNTER — Telehealth: Payer: Self-pay | Admitting: Family Medicine

## 2015-12-31 DIAGNOSIS — Z1211 Encounter for screening for malignant neoplasm of colon: Secondary | ICD-10-CM

## 2015-12-31 NOTE — Telephone Encounter (Signed)
plz notify I received copy of colonoscopy from 03/2005. She is due for rpt colon cancer screening.  Would offer rpt colonoscopy, yearly stool kit, or Q10yrcologuard fecal DNA test. Do recommend she complete some form of screening.

## 2016-01-03 NOTE — Telephone Encounter (Signed)
Message left for patient to return my call.  

## 2016-01-06 NOTE — Telephone Encounter (Signed)
Patient notified and prefers to have colonoscopy with [facility].

## 2016-01-06 NOTE — Telephone Encounter (Signed)
Referral placed.

## 2016-01-12 DIAGNOSIS — M4013 Other secondary kyphosis, cervicothoracic region: Secondary | ICD-10-CM | POA: Diagnosis not present

## 2016-01-12 DIAGNOSIS — I10 Essential (primary) hypertension: Secondary | ICD-10-CM | POA: Diagnosis not present

## 2016-01-12 DIAGNOSIS — Z6837 Body mass index (BMI) 37.0-37.9, adult: Secondary | ICD-10-CM | POA: Diagnosis not present

## 2016-02-08 ENCOUNTER — Ambulatory Visit (INDEPENDENT_AMBULATORY_CARE_PROVIDER_SITE_OTHER): Payer: Medicare Other | Admitting: Podiatry

## 2016-02-08 ENCOUNTER — Encounter: Payer: Self-pay | Admitting: Podiatry

## 2016-02-08 DIAGNOSIS — Q828 Other specified congenital malformations of skin: Secondary | ICD-10-CM | POA: Diagnosis not present

## 2016-02-08 DIAGNOSIS — M79676 Pain in unspecified toe(s): Secondary | ICD-10-CM

## 2016-02-08 DIAGNOSIS — B351 Tinea unguium: Secondary | ICD-10-CM

## 2016-02-08 NOTE — Progress Notes (Signed)
She presents today with chief complaint of painful elongated toenails and calluses bilateral.  Objective: Vital signs are stable alert and oriented 3. Pulses are strongly palpable. Her toenails are thick yellow dystrophic onychomycotic bilaterally with multiple porokeratotic lesions plantar aspect of the foot bilateral.  Assessment: Pain and limp secondary to onychomycosis of porokeratosis bilateral.  Plan: Debridement of toenails 1 through 5 bilateral covered service secondary to pain. And debrided all reactive hyperkeratoses. Follow up with her as needed.

## 2016-02-16 DIAGNOSIS — I10 Essential (primary) hypertension: Secondary | ICD-10-CM | POA: Diagnosis not present

## 2016-02-16 DIAGNOSIS — M4712 Other spondylosis with myelopathy, cervical region: Secondary | ICD-10-CM | POA: Diagnosis not present

## 2016-02-16 DIAGNOSIS — Z6837 Body mass index (BMI) 37.0-37.9, adult: Secondary | ICD-10-CM | POA: Diagnosis not present

## 2016-02-23 DIAGNOSIS — M4712 Other spondylosis with myelopathy, cervical region: Secondary | ICD-10-CM | POA: Diagnosis not present

## 2016-02-23 DIAGNOSIS — M5134 Other intervertebral disc degeneration, thoracic region: Secondary | ICD-10-CM | POA: Diagnosis not present

## 2016-02-23 DIAGNOSIS — M50321 Other cervical disc degeneration at C4-C5 level: Secondary | ICD-10-CM | POA: Diagnosis not present

## 2016-03-14 DIAGNOSIS — H2513 Age-related nuclear cataract, bilateral: Secondary | ICD-10-CM | POA: Diagnosis not present

## 2016-03-19 ENCOUNTER — Other Ambulatory Visit (HOSPITAL_BASED_OUTPATIENT_CLINIC_OR_DEPARTMENT_OTHER): Payer: Medicare Other

## 2016-03-19 ENCOUNTER — Ambulatory Visit (HOSPITAL_BASED_OUTPATIENT_CLINIC_OR_DEPARTMENT_OTHER): Payer: Medicare Other | Admitting: Hematology & Oncology

## 2016-03-19 ENCOUNTER — Encounter: Payer: Self-pay | Admitting: Hematology & Oncology

## 2016-03-19 VITALS — BP 150/65 | HR 64 | Temp 97.3°F | Resp 16 | Ht 62.0 in | Wt 212.0 lb

## 2016-03-19 DIAGNOSIS — G2581 Restless legs syndrome: Secondary | ICD-10-CM

## 2016-03-19 DIAGNOSIS — C4361 Malignant melanoma of right upper limb, including shoulder: Secondary | ICD-10-CM | POA: Diagnosis not present

## 2016-03-19 DIAGNOSIS — C4310 Malignant melanoma of unspecified eyelid, including canthus: Secondary | ICD-10-CM

## 2016-03-19 DIAGNOSIS — R945 Abnormal results of liver function studies: Secondary | ICD-10-CM

## 2016-03-19 DIAGNOSIS — C4321 Malignant melanoma of right ear and external auricular canal: Secondary | ICD-10-CM

## 2016-03-19 DIAGNOSIS — I159 Secondary hypertension, unspecified: Secondary | ICD-10-CM

## 2016-03-19 DIAGNOSIS — C779 Secondary and unspecified malignant neoplasm of lymph node, unspecified: Secondary | ICD-10-CM

## 2016-03-19 LAB — CMP (CANCER CENTER ONLY)
ALT(SGPT): 28 U/L (ref 10–47)
AST: 18 U/L (ref 11–38)
Albumin: 3.7 g/dL (ref 3.3–5.5)
Alkaline Phosphatase: 46 U/L (ref 26–84)
BUN, Bld: 18 mg/dL (ref 7–22)
CO2: 30 mEq/L (ref 18–33)
Calcium: 9.5 mg/dL (ref 8.0–10.3)
Chloride: 107 mEq/L (ref 98–108)
Creat: 0.9 mg/dl (ref 0.6–1.2)
Glucose, Bld: 104 mg/dL (ref 73–118)
Potassium: 4 mEq/L (ref 3.3–4.7)
Sodium: 140 mEq/L (ref 128–145)
Total Bilirubin: 0.7 mg/dl (ref 0.20–1.60)
Total Protein: 6.8 g/dL (ref 6.4–8.1)

## 2016-03-19 LAB — CBC WITH DIFFERENTIAL (CANCER CENTER ONLY)
BASO#: 0 10*3/uL (ref 0.0–0.2)
BASO%: 0.2 % (ref 0.0–2.0)
EOS%: 2.9 % (ref 0.0–7.0)
Eosinophils Absolute: 0.2 10*3/uL (ref 0.0–0.5)
HCT: 40.4 % (ref 34.8–46.6)
HGB: 13.3 g/dL (ref 11.6–15.9)
LYMPH#: 2.1 10*3/uL (ref 0.9–3.3)
LYMPH%: 31.8 % (ref 14.0–48.0)
MCH: 31.3 pg (ref 26.0–34.0)
MCHC: 32.9 g/dL (ref 32.0–36.0)
MCV: 95 fL (ref 81–101)
MONO#: 0.6 10*3/uL (ref 0.1–0.9)
MONO%: 8.3 % (ref 0.0–13.0)
NEUT#: 3.8 10*3/uL (ref 1.5–6.5)
NEUT%: 56.8 % (ref 39.6–80.0)
Platelets: 168 10*3/uL (ref 145–400)
RBC: 4.25 10*6/uL (ref 3.70–5.32)
RDW: 12.5 % (ref 11.1–15.7)
WBC: 6.6 10*3/uL (ref 3.9–10.0)

## 2016-03-19 NOTE — Progress Notes (Signed)
Hematology and Oncology Follow Up Visit  [name] [id number] [birth year] 70 y.o. 03/19/2016   Principle Diagnosis:  Stage IIIC (T3b N3 M0) melanoma of the right shoulder.  Current Therapy:    Observation     Interim History:  Ms.  [name] is back for followup. Unfortunately, her neck is causing more problems for her. It sounds a she is going to need extensive neck surgery. She will have this this summer. She'll be fusion from C4-C7. Then the past fusion at C3-C4 will be "hooked in".  Because of her neck, she really does not have much range of motion.  She, otherwise, seems to be doing fairly well. [name] had no problem with offer shortness of breath. [name] had no nausea or vomiting. She is still able to swallow okay.  [name] had problems with varicose veins in her legs. The right ankle seems to cause her the most trouble   [name] had no fever. [name] had no rashes. [name] had no leg swelling.  There's been no change in medications.   Overall, her performance status is ECOG 2.  Medications:  Current outpatient prescriptions:  .  acetaminophen (TYLENOL ARTHRITIS PAIN) 650 MG CR tablet, Take 650 mg by mouth every 8 (eight) hours as needed for pain., Disp: , Rfl:  .  aspirin EC 81 MG tablet, Take 81 mg by mouth at bedtime. Melanoma prevention, Disp: , Rfl:  .  furosemide (LASIX) 20 MG tablet, Take 20 mg by mouth every other day., Disp: , Rfl:  .  KLOR-CON M10 10 MEQ tablet, TAKE 2 TABLETS BY MOUTH DAILY., Disp: 180 tablet, Rfl: 3 .  losartan-hydrochlorothiazide (HYZAAR) 50-12.5 MG per tablet, TAKE 1 TABLET BY MOUTH DAILY., Disp: 30 tablet, Rfl: 3 .  montelukast (SINGULAIR) 10 MG tablet, Take 10 mg by mouth at bedtime., Disp: , Rfl:  .  Multiple Vitamin (MULTIVITAMIN WITH MINERALS) TABS tablet, Take 1 tablet by mouth daily., Disp: , Rfl:  .  Polyvinyl Alcohol-Povidone (TEARS PLUS OP), Place 1 drop into both eyes daily as needed (dry eyes/ redness/ burning)., Disp: , Rfl:  .   PRESCRIPTION MEDICATION, CPAP, Disp: , Rfl:  .  PROAIR HFA 108 (90 BASE) MCG/ACT inhaler, INHALE 2 PUFFS INTO LUNGS EVERY 6 HOURS AS NEEDED FOR WHEEZING OR SHORTNESS OF BREATH, Disp: 8.5 Inhaler, Rfl: 3 .  Probiotic Product (PROBIOTIC DAILY PO), Take 1 tablet by mouth daily. , Disp: , Rfl:  .  Vitamin D, Ergocalciferol, (DRISDOL) 50000 units CAPS capsule, TAKE 1 CAPSULE (50,000 UNITS TOTAL) BY MOUTH EVERY 7 (SEVEN) DAYS., Disp: 4 capsule, Rfl: 5  Allergies:  Allergies  Allergen Reactions  . Sulfa Antibiotics Itching and Swelling    Facial swelling  . Tape Other (See Comments)    Caused blisters - must use paper tape - same reaction from NeoPrene    Past Medical History, Surgical history, Social history, and Family History were reviewed and updated.  Review of Systems: As above  Physical Exam:  height is 5\' 2"  (1.575 m) and weight is 212 lb (96.163 kg). Her oral temperature is 97.3 F (36.3 C). Her blood pressure is 150/65 and her pulse is 64. Her respiration is 16.   [name] and well-nourished white female. Head and neck exam is no ocular or oral lesions. She has a healing cervical limits be scar on the anterior neck. There are no palpable cervical or supraclavicular lymph nodes. Lungs are clear. Cardiac exam regular rate and rhythm with no murmurs rubs or bruits. Abdomen  soft. Has good bowel sounds. Is no fluid wave. There is a palpable hepato- splenomegaly. Exam shows a wide local excision scar in the posterior right shoulder region. This well-healed. No tenderness over the spine ribs or hips. Axillary exam shows no bilateral axillary adenopathy. Extremities shows no clubbing cyanosis or edema. She has a bandage on the left lower leg in the upper portion. She has a compression stocking on the left leg. She has decent pulses in her distal extremities. There is mild lymphedema noted in the right arm. Skin exam shows no suspicious skin lesions.  Lab Results  Component Value Date    WBC 6.6 03/19/2016   HGB 13.3 03/19/2016   HCT 40.4 03/19/2016   MCV 95 03/19/2016   PLT 168 03/19/2016     Chemistry      Component Value Date/Time   NA 140 03/19/2016 1007   NA 147* 09/19/2015 1007   NA 139 08/18/2015 1430   NA 146 10/21/2012   K 4.0 03/19/2016 1007   K 4.2 09/19/2015 1007   K 3.9 08/18/2015 1430   CL 107 03/19/2016 1007   CL 101 08/18/2015 1430   CO2 30 03/19/2016 1007   CO2 31* 09/19/2015 1007   CO2 28 08/18/2015 1430   BUN 18 03/19/2016 1007   BUN 18.4 09/19/2015 1007   BUN 16 08/18/2015 1430   BUN 18 10/21/2012   CREATININE 0.9 03/19/2016 1007   CREATININE 0.8 09/19/2015 1007   CREATININE 0.79 08/18/2015 1430      Component Value Date/Time   CALCIUM 9.5 03/19/2016 1007   CALCIUM 9.7 09/19/2015 1007   CALCIUM 9.7 08/18/2015 1430   ALKPHOS 46 03/19/2016 1007   ALKPHOS 64 09/19/2015 1007   ALKPHOS 63 03/18/2015 1000   AST 18 03/19/2016 1007   AST 9 09/19/2015 1007   AST 10 03/18/2015 1000   ALT 28 03/19/2016 1007   ALT 13 09/19/2015 1007   ALT 15 03/18/2015 1000   BILITOT 0.70 03/19/2016 1007   BILITOT 0.31 09/19/2015 1007   BILITOT 0.5 03/18/2015 1000         Impression and Plan: [name] is a 70 year old white female with stage IIIc melanoma of the right shoulder. She did undergo resection. She had 8 positive lymph nodes. She completed adjuvant interferon therapy in December of 2012. She had radiation therapy to the right axilla.  I see no evidence of recurrence. However, she is at significant risk for recurrence. She worries about recurrence. I do not see any problems with her having her neck surgery. Her immune system should be fine. I cannot imagine problems with her healing. [name] not been on any therapy now for a couple years.   It will be interesting to see what is found at the time of surgery. Hold, there is no melanoma that is inciting all of this.   I will plan to see her back in another 6 months  I spent about 25-30  minutes with her today.   [name], MD 5/15/201711:26 AM

## 2016-03-22 ENCOUNTER — Encounter: Payer: Self-pay | Admitting: Family Medicine

## 2016-03-22 ENCOUNTER — Ambulatory Visit (INDEPENDENT_AMBULATORY_CARE_PROVIDER_SITE_OTHER): Payer: Medicare Other | Admitting: Family Medicine

## 2016-03-22 VITALS — BP 114/70 | HR 72 | Temp 98.2°F | Wt 209.0 lb

## 2016-03-22 DIAGNOSIS — M4712 Other spondylosis with myelopathy, cervical region: Secondary | ICD-10-CM

## 2016-03-22 DIAGNOSIS — L304 Erythema intertrigo: Secondary | ICD-10-CM | POA: Insufficient documentation

## 2016-03-22 DIAGNOSIS — R21 Rash and other nonspecific skin eruption: Secondary | ICD-10-CM

## 2016-03-22 DIAGNOSIS — D3132 Benign neoplasm of left choroid: Secondary | ICD-10-CM

## 2016-03-22 DIAGNOSIS — D3192 Benign neoplasm of unspecified part of left eye: Secondary | ICD-10-CM | POA: Diagnosis not present

## 2016-03-22 DIAGNOSIS — M5412 Radiculopathy, cervical region: Secondary | ICD-10-CM

## 2016-03-22 DIAGNOSIS — G959 Disease of spinal cord, unspecified: Secondary | ICD-10-CM

## 2016-03-22 HISTORY — DX: Benign neoplasm of left choroid: D31.32

## 2016-03-22 MED ORDER — NYSTATIN 100000 UNIT/GM EX CREA: 1.0000 "application " | TOPICAL_CREAM | Freq: Two times a day (BID) | CUTANEOUS | Status: DC

## 2016-03-22 NOTE — Patient Instructions (Addendum)
Good to see you today Let me know how you do with upcoming surgeries.  Try nystatin cream to left neck rash.  Return in 6 months for medicare wellness visit

## 2016-03-22 NOTE — Assessment & Plan Note (Signed)
Possible candidal rash vs bug bites. Treat with nystatin cream. Update if not improved with treatment.

## 2016-03-22 NOTE — Assessment & Plan Note (Signed)
Monitored by ophtho.

## 2016-03-22 NOTE — Progress Notes (Signed)
Pre visit review using our clinic review tool, if applicable. No additional management support is needed unless otherwise documented below in the visit note. 

## 2016-03-22 NOTE — Progress Notes (Signed)
BP 114/70 mmHg  Pulse 72  Temp(Src) 98.2 F (36.8 C) (Oral)  Wt 209 lb (94.802 kg)   CC: f/u visit  Subjective:    Patient ID: [name], female    DOB: May 22, 1946, 70 y.o.   [name]  HPI: [name] is a 70 y.o. female presenting on 03/22/2016 for Follow-up   Recently seen by neurosurgery and oncology. Completed ACDF 08/2015 - helped for 2 months then symptoms returned. Looking at 3 separate surgeries over summer with [facility] neurosurgeon - planning C4-7 fusion (has already had C3-4 fusion). Neck issues thought stemming from prior interferon treatment for melanoma (rigid neck muscles).   ANTERIOR CERVICAL DECOMP/DISCECTOMY FUSION Laterality: N/A Date: 08/22/2015 ANTERIOR CERVICAL DECOMPRESSION FUSION C3/4 - interbody graft and anterior plate; Kristeen Miss, MD  Recently saw eye doctor and dentist. Cataracts and retinal nevus L eye.   Due for colonoscopy.   Relevant past medical, surgical, family and social history reviewed and updated as indicated. Interim medical history since our last visit reviewed. Allergies and medications reviewed and updated. Current Outpatient Prescriptions on File Prior to Visit  Medication Sig  . acetaminophen (TYLENOL ARTHRITIS PAIN) 650 MG CR tablet Take 650 mg by mouth every 8 (eight) hours as needed for pain.  Marland Kitchen aspirin EC 81 MG tablet Take 81 mg by mouth at bedtime. Melanoma prevention  . furosemide (LASIX) 20 MG tablet Take 20 mg by mouth every other day.  Marland Kitchen KLOR-CON M10 10 MEQ tablet TAKE 2 TABLETS BY MOUTH DAILY.  Marland Kitchen losartan-hydrochlorothiazide (HYZAAR) 50-12.5 MG per tablet TAKE 1 TABLET BY MOUTH DAILY.  . montelukast (SINGULAIR) 10 MG tablet Take 10 mg by mouth at bedtime.  . Multiple Vitamin (MULTIVITAMIN WITH MINERALS) TABS tablet Take 1 tablet by mouth daily.  . Polyvinyl Alcohol-Povidone (TEARS PLUS OP) Place 1 drop into both eyes daily as needed (dry eyes/ redness/ burning).  Marland Kitchen PRESCRIPTION MEDICATION CPAP  . PROAIR HFA  108 (90 BASE) MCG/ACT inhaler INHALE 2 PUFFS INTO LUNGS EVERY 6 HOURS AS NEEDED FOR WHEEZING OR SHORTNESS OF BREATH  . Probiotic Product (PROBIOTIC DAILY PO) Take 1 tablet by mouth daily.   . Vitamin D, Ergocalciferol, (DRISDOL) 50000 units CAPS capsule TAKE 1 CAPSULE (50,000 UNITS TOTAL) BY MOUTH EVERY 7 (SEVEN) DAYS.   No current facility-administered medications on file prior to visit.    Review of Systems Per HPI unless specifically indicated in ROS section     Objective:    BP 114/70 mmHg  Pulse 72  Temp(Src) 98.2 F (36.8 C) (Oral)  Wt 209 lb (94.802 kg)  Wt Readings from Last 3 Encounters:  03/22/16 209 lb (94.802 kg)  03/19/16 212 lb (96.163 kg)  09/23/15 212 lb 8 oz (96.389 kg)    Physical Exam  Constitutional: She appears well-developed and well-nourished. No distress.  Neck:  Neck deviated anteriorly  Cardiovascular: Normal rate, regular rhythm, normal heart sounds and intact distal pulses.   No murmur heard. Pulmonary/Chest: Effort normal and breath sounds normal. No respiratory distress. She has no wheezes. She has no rales.  Skin: Skin is warm and dry. Rash noted. There is erythema.  Slight erythematous pruritic rash at L neck fold with few papules  Nursing note and vitals reviewed.  Results for orders placed or performed in visit on 03/19/16  CBC with Differential [facility])  Result Value Ref Range   WBC 6.6 3.9 - 10.0 10e3/uL   RBC 4.25 3.70 - 5.32 10e6/uL   HGB 13.3 11.6 -  15.9 g/dL   HCT 40.4 34.8 - 46.6 %   MCV 95 81 - 101 fL   MCH 31.3 26.0 - 34.0 pg   MCHC 32.9 32.0 - 36.0 g/dL   RDW 12.5 11.1 - 15.7 %   Platelets 168 145 - 400 10e3/uL   NEUT# 3.8 1.5 - 6.5 10e3/uL   LYMPH# 2.1 0.9 - 3.3 10e3/uL   MONO# 0.6 0.1 - 0.9 10e3/uL   Eosinophils Absolute 0.2 0.0 - 0.5 10e3/uL   BASO# 0.0 0.0 - 0.2 10e3/uL   NEUT% 56.8 39.6 - 80.0 %   LYMPH% 31.8 14.0 - 48.0 %   MONO% 8.3 0.0 - 13.0 %   EOS% 2.9 0.0 - 7.0 %   BASO% 0.2 0.0 - 2.0 %  CMP STAT  ([facility] only)  Result Value Ref Range   Sodium 140 128 - 145 mEq/L   Potassium 4.0 3.3 - 4.7 mEq/L   Chloride 107 98 - 108 mEq/L   CO2 30 18 - 33 mEq/L   Glucose, Bld 104 73 - 118 mg/dL   BUN, Bld 18 7 - 22 mg/dL   Creat 0.9 0.6 - 1.2 mg/dl   Total Bilirubin 0.70 0.20 - 1.60 mg/dl   Alkaline Phosphatase 46 26 - 84 U/L   AST 18 11 - 38 U/L   ALT(SGPT) 28 10 - 47 U/L   Total Protein 6.8 6.4 - 8.1 g/dL   Albumin 3.7 3.3 - 5.5 g/dL   Calcium 9.5 8.0 - 10.3 mg/dL      Assessment & Plan:   Problem List Items Addressed This Visit    Cervical myelopathy with cervical radiculopathy    Pending rpt surgery over summer. Reviewed course to date.       Skin rash - Primary    Possible candidal rash vs bug bites. Treat with nystatin cream. Update if not improved with treatment.      Nevus of left eye    Monitored by ophtho.           Follow up plan: Return in about 6 months (around 09/22/2016), or as needed, for medicare wellness visit.  [name], MD

## 2016-03-22 NOTE — Assessment & Plan Note (Signed)
Pending rpt surgery over summer. Reviewed course to date.

## 2016-03-24 ENCOUNTER — Encounter: Payer: Self-pay | Admitting: Family Medicine

## 2016-04-01 ENCOUNTER — Other Ambulatory Visit: Payer: Self-pay | Admitting: Family Medicine

## 2016-05-02 ENCOUNTER — Telehealth: Payer: Self-pay

## 2016-05-02 NOTE — Telephone Encounter (Signed)
Pt left v/m; pt was seen 03/22/16; rash on pts neck has spread from lt side of neck all the way across the neck; pt has been using nystatin and one day seems to be better and then next day appears worse. Pt wants to know if needs different med or needs f/u appt. Pt request cb. [place].

## 2016-05-02 NOTE — Telephone Encounter (Signed)
Would offer f/u appt tomorrow 1:15pm

## 2016-05-03 NOTE — Telephone Encounter (Signed)
Left detailed message to call back to mae a f/u appt.

## 2016-05-04 ENCOUNTER — Encounter: Payer: Self-pay | Admitting: Family Medicine

## 2016-05-04 NOTE — Telephone Encounter (Signed)
Also - received letter from GI that pt declined to schedule colonoscopy due to upcoming neck surgeries which is reasonable. Would offer iFOB or cologuard if pt desires as alternate options for colon cancer screening (then wouldn't have to reassess for 1-3 yrs).

## 2016-05-07 ENCOUNTER — Encounter: Payer: Self-pay | Admitting: Family Medicine

## 2016-05-07 ENCOUNTER — Ambulatory Visit (INDEPENDENT_AMBULATORY_CARE_PROVIDER_SITE_OTHER): Payer: Medicare Other | Admitting: Family Medicine

## 2016-05-07 ENCOUNTER — Ambulatory Visit: Payer: Medicare Other | Admitting: Internal Medicine

## 2016-05-07 VITALS — BP 132/82 | HR 68 | Temp 98.3°F | Wt 212.2 lb

## 2016-05-07 DIAGNOSIS — L304 Erythema intertrigo: Secondary | ICD-10-CM | POA: Diagnosis not present

## 2016-05-07 MED ORDER — CLOTRIMAZOLE 1 % EX CREA: 1.0000 "application " | TOPICAL_CREAM | Freq: Two times a day (BID) | CUTANEOUS | Status: DC

## 2016-05-07 NOTE — Telephone Encounter (Signed)
Appt today for rash. Will mention to her at that time.

## 2016-05-07 NOTE — Patient Instructions (Signed)
I still think this is fungal infection with some heat rash.  Change from nystatin to clotrimazole cream daily. Keep area dry - consider gauze or tissue below neck when at home  Update [place] if not improved with treatment.

## 2016-05-07 NOTE — Telephone Encounter (Signed)
Spoke with patient. She declined iFOB and Cologuard as well at this time. She said that if they returned +, she knew she would still need the colonoscopy and she doesn't want it due to too much sedation with the impending neck surgeries. At her last colonoscopy she was told to repeat it in 10 years and she will repeat at that time. She is not having any symptoms right now and knows the risks of not doing the iFOB or Cologuard at this time.

## 2016-05-07 NOTE — Assessment & Plan Note (Signed)
Anticipate candidal intertrigo - not responding to nystatin. Will change to clotrimazole antifungal, discussed importance of keeping area dry. Update if not improved. Pt agrees with plan.

## 2016-05-07 NOTE — Progress Notes (Signed)
Pre visit review using our clinic review tool, if applicable. No additional management support is needed unless otherwise documented below in the visit note. 

## 2016-05-07 NOTE — Progress Notes (Signed)
BP 132/82 mmHg  Pulse 68  Temp(Src) 98.3 F (36.8 C) (Oral)  Wt 212 lb 4 oz (96.276 kg)   CC: rash on neck  Subjective:    Patient ID: [name], female    DOB: 1946/10/06, 70 y.o.   [name]  HPI: [name] is a 70 y.o. female presenting on 05/07/2016 for Rash   See prior note for details - seen here 03/2016 with skin rash to neck, thought candidal vs bug bites - treated with nystatin cream without significant improvement - and now seeming to spread. Rash present for last 2 months. Rash seemed to originate at scar from prior surgery.   No new lotions, detergents, soaps or shampoos. No new medicines or foods. No oral lesions, no fevers or nausea or joint pains.   Desires to defer colonoscopy - does not want any other colon cancer screning modality at this time.   Relevant past medical, surgical, family and social history reviewed and updated as indicated. Interim medical history since our last visit reviewed. Allergies and medications reviewed and updated. Current Outpatient Prescriptions on File Prior to Visit  Medication Sig  . acetaminophen (TYLENOL ARTHRITIS PAIN) 650 MG CR tablet Take 650 mg by mouth every 8 (eight) hours as needed for pain.  Marland Kitchen aspirin EC 81 MG tablet Take 81 mg by mouth at bedtime. Melanoma prevention  . furosemide (LASIX) 20 MG tablet Take 20 mg by mouth every other day.  Marland Kitchen KLOR-CON M10 10 MEQ tablet TAKE 2 TABLETS BY MOUTH DAILY.  Marland Kitchen losartan-hydrochlorothiazide (HYZAAR) 50-12.5 MG tablet TAKE 1 TABLET BY MOUTH DAILY.  . montelukast (SINGULAIR) 10 MG tablet Take 10 mg by mouth at bedtime.  . Multiple Vitamin (MULTIVITAMIN WITH MINERALS) TABS tablet Take 1 tablet by mouth daily.  Marland Kitchen nystatin cream (MYCOSTATIN) Apply 1 application topically 2 (two) times daily.  . Polyvinyl Alcohol-Povidone (TEARS PLUS OP) Place 1 drop into both eyes daily as needed (dry eyes/ redness/ burning).  Marland Kitchen PRESCRIPTION MEDICATION CPAP  . PROAIR HFA 108 (90 BASE) MCG/ACT  inhaler INHALE 2 PUFFS INTO LUNGS EVERY 6 HOURS AS NEEDED FOR WHEEZING OR SHORTNESS OF BREATH  . Probiotic Product (PROBIOTIC DAILY PO) Take 1 tablet by mouth daily.   . Vitamin D, Ergocalciferol, (DRISDOL) 50000 units CAPS capsule TAKE 1 CAPSULE (50,000 UNITS TOTAL) BY MOUTH EVERY 7 (SEVEN) DAYS.   No current facility-administered medications on file prior to visit.    Review of Systems Per HPI unless specifically indicated in ROS section     Objective:    BP 132/82 mmHg  Pulse 68  Temp(Src) 98.3 F (36.8 C) (Oral)  Wt 212 lb 4 oz (96.276 kg)  Wt Readings from Last 3 Encounters:  05/07/16 212 lb 4 oz (96.276 kg)  03/22/16 209 lb (94.802 kg)  03/19/16 212 lb (96.163 kg)    Physical Exam  Constitutional: She appears well-developed and well-nourished. No distress.  Skin: Skin is warm and dry. Rash noted. There is erythema.  Faint erythematous rash under neck pannus with some papules present, pruritic  Nursing note and vitals reviewed.     Assessment & Plan:   Problem List Items Addressed This Visit    Intertrigo - Primary    Anticipate candidal intertrigo - not responding to nystatin. Will change to clotrimazole antifungal, discussed importance of keeping area dry. Update if not improved. Pt agrees with plan.          Follow up plan: No Follow-up on file.  [name], MD

## 2016-05-09 ENCOUNTER — Ambulatory Visit (INDEPENDENT_AMBULATORY_CARE_PROVIDER_SITE_OTHER): Payer: Medicare Other | Admitting: Podiatry

## 2016-05-09 ENCOUNTER — Encounter: Payer: Self-pay | Admitting: Podiatry

## 2016-05-09 DIAGNOSIS — B351 Tinea unguium: Secondary | ICD-10-CM

## 2016-05-09 DIAGNOSIS — M79676 Pain in unspecified toe(s): Secondary | ICD-10-CM

## 2016-05-09 DIAGNOSIS — Q828 Other specified congenital malformations of skin: Secondary | ICD-10-CM

## 2016-05-09 NOTE — Progress Notes (Signed)
She presents today with chief complaint of painful elongated toenails and porokeratosis plantar aspect of the forefoot and distal aspect of the toes. She is also concerned about second digit of the right foot tender when it is straightened and the fact that her second toe rubs or shoes.  Objective: Vital signs are stable she is alert and oriented 3. Pulses are palpable. No erythema edema saline as drainage or odor. Her toenails are thick yellow dystrophic onychomycotic sharply incurvated and painful on palpation as well as debridement. Multiple porokeratosis or distal clavi to the tips of the toes. Mild flexor contraction at the PIPJ second digit of the right foot which fails to straighten 280 indicative of osteoarthritis and hammertoe deformity.  Assessment: Pain and limp secondary to onychomycosis hammertoe deformity second right. Porokeratosis bilateral.  Plan: Debridement of all reactive hyperkeratoses and debridement of nails bilateral. Follow up with her in 3 months

## 2016-05-24 DIAGNOSIS — M4712 Other spondylosis with myelopathy, cervical region: Secondary | ICD-10-CM | POA: Diagnosis not present

## 2016-05-30 DIAGNOSIS — Z85820 Personal history of malignant melanoma of skin: Secondary | ICD-10-CM | POA: Diagnosis not present

## 2016-05-30 DIAGNOSIS — D2272 Melanocytic nevi of left lower limb, including hip: Secondary | ICD-10-CM | POA: Diagnosis not present

## 2016-05-30 DIAGNOSIS — D2261 Melanocytic nevi of right upper limb, including shoulder: Secondary | ICD-10-CM | POA: Diagnosis not present

## 2016-05-30 DIAGNOSIS — D1801 Hemangioma of skin and subcutaneous tissue: Secondary | ICD-10-CM | POA: Diagnosis not present

## 2016-05-30 DIAGNOSIS — Z85828 Personal history of other malignant neoplasm of skin: Secondary | ICD-10-CM | POA: Diagnosis not present

## 2016-05-30 DIAGNOSIS — L821 Other seborrheic keratosis: Secondary | ICD-10-CM | POA: Diagnosis not present

## 2016-05-30 DIAGNOSIS — D2271 Melanocytic nevi of right lower limb, including hip: Secondary | ICD-10-CM | POA: Diagnosis not present

## 2016-05-30 DIAGNOSIS — D2262 Melanocytic nevi of left upper limb, including shoulder: Secondary | ICD-10-CM | POA: Diagnosis not present

## 2016-05-31 ENCOUNTER — Other Ambulatory Visit: Payer: Self-pay | Admitting: Family Medicine

## 2016-06-01 ENCOUNTER — Other Ambulatory Visit: Payer: Self-pay | Admitting: Neurological Surgery

## 2016-06-04 ENCOUNTER — Other Ambulatory Visit: Payer: Self-pay | Admitting: Family Medicine

## 2016-06-05 HISTORY — PX: OTHER SURGICAL HISTORY: SHX169

## 2016-06-07 ENCOUNTER — Telehealth: Payer: Self-pay | Admitting: *Deleted

## 2016-06-07 NOTE — Telephone Encounter (Signed)
Patient called to let us know that her surgery with Dr. Ellene Route is schedul for Monday August 28th at [facility].  She is having anterior cervical fusion.  Message given to Dr. Marin Olp

## 2016-06-08 ENCOUNTER — Telehealth: Payer: Self-pay | Admitting: Family Medicine

## 2016-06-08 DIAGNOSIS — Z1239 Encounter for other screening for malignant neoplasm of breast: Secondary | ICD-10-CM

## 2016-06-08 DIAGNOSIS — E2839 Other primary ovarian failure: Secondary | ICD-10-CM

## 2016-06-08 NOTE — Telephone Encounter (Signed)
Pt called and needs new order for bone density for estrogen definiency. She also need screening MM order. She is having NO new breast problems.   Pt is going to schedule herself once the order has been faxed to [facility] [phone] F: [phone]  Please advise--I will call pt once order has been faxed. Thanks

## 2016-06-11 NOTE — Telephone Encounter (Signed)
Orders faxed to [facility]  LVM on pt cell to call back so I can inform her orders were faxed

## 2016-06-11 NOTE — Telephone Encounter (Signed)
Orders placed.

## 2016-06-13 NOTE — Telephone Encounter (Signed)
LVM advising pt orders were faxed and to please let me know when she is scheduled so we can document appropriately

## 2016-06-14 NOTE — Telephone Encounter (Signed)
Patient returned Allison's call.  Patient's appointment is on 06/18/16.

## 2016-06-18 ENCOUNTER — Encounter: Payer: Self-pay | Admitting: Family Medicine

## 2016-06-18 DIAGNOSIS — Z13820 Encounter for screening for osteoporosis: Secondary | ICD-10-CM | POA: Diagnosis not present

## 2016-06-18 DIAGNOSIS — E2839 Other primary ovarian failure: Secondary | ICD-10-CM | POA: Diagnosis not present

## 2016-06-18 DIAGNOSIS — Z1231 Encounter for screening mammogram for malignant neoplasm of breast: Secondary | ICD-10-CM | POA: Diagnosis not present

## 2016-06-18 LAB — HM MAMMOGRAPHY

## 2016-06-20 ENCOUNTER — Encounter: Payer: Self-pay | Admitting: Family Medicine

## 2016-06-21 ENCOUNTER — Encounter: Payer: Self-pay | Admitting: *Deleted

## 2016-06-24 ENCOUNTER — Encounter: Payer: Self-pay | Admitting: Family Medicine

## 2016-06-25 ENCOUNTER — Encounter (HOSPITAL_COMMUNITY): Payer: Self-pay

## 2016-06-25 ENCOUNTER — Telehealth: Payer: Self-pay | Admitting: Family Medicine

## 2016-06-25 ENCOUNTER — Encounter (HOSPITAL_COMMUNITY)
Admission: RE | Admit: 2016-06-25 | Discharge: 2016-06-25 | Disposition: A | Discharge status: Home / Self Care | Payer: Medicare Other | Source: Ambulatory Visit | Attending: Neurological Surgery | Admitting: Neurological Surgery

## 2016-06-25 DIAGNOSIS — Z01812 Encounter for preprocedural laboratory examination: Secondary | ICD-10-CM | POA: Diagnosis not present

## 2016-06-25 DIAGNOSIS — M4712 Other spondylosis with myelopathy, cervical region: Secondary | ICD-10-CM | POA: Diagnosis not present

## 2016-06-25 DIAGNOSIS — Z0183 Encounter for blood typing: Secondary | ICD-10-CM | POA: Insufficient documentation

## 2016-06-25 LAB — CBC
HCT: 41.5 % (ref 36.0–46.0)
Hemoglobin: 13.2 g/dL (ref 12.0–15.0)
MCH: 30.8 pg (ref 26.0–34.0)
MCHC: 31.8 g/dL (ref 30.0–36.0)
MCV: 96.7 fL (ref 78.0–100.0)
Platelets: 179 10*3/uL (ref 150–400)
RBC: 4.29 MIL/uL (ref 3.87–5.11)
RDW: 13 % (ref 11.5–15.5)
WBC: 7.2 10*3/uL (ref 4.0–10.5)

## 2016-06-25 LAB — BASIC METABOLIC PANEL
Anion gap: 3 — ABNORMAL LOW (ref 5–15)
BUN: 19 mg/dL (ref 6–20)
CO2: 31 mmol/L (ref 22–32)
Calcium: 9.5 mg/dL (ref 8.9–10.3)
Chloride: 105 mmol/L (ref 101–111)
Creatinine, Ser: 0.7 mg/dL (ref 0.44–1.00)
GFR calc Af Amer: >60 mL/min (ref >60)
GFR calc non Af Amer: >60 mL/min (ref >60)
Glucose, Bld: 106 mg/dL — ABNORMAL HIGH (ref 65–99)
Potassium: 3.8 mmol/L (ref 3.5–5.1)
Sodium: 139 mmol/L (ref 135–145)

## 2016-06-25 LAB — TYPE AND SCREEN
ABO/RH(D): A POS
Antibody Screen: NEGATIVE

## 2016-06-25 LAB — SURGICAL PCR SCREEN
MRSA, PCR: NEGATIVE
Staphylococcus aureus: NEGATIVE

## 2016-06-25 LAB — ABO/RH: ABO/RH(D): A POS

## 2016-06-25 NOTE — Pre-Procedure Instructions (Signed)
[name]  06/25/2016      [id number] - [place] [address] Phone: [phone] Fax: [phone]  [id number] - [place], [name] [address] Phone: [phone] Fax: [phone]    Your procedure is scheduled on  Monday 07/02/16  Report to [facility] at 530 A.M.  Call this number if you have problems the morning of surgery:  [phone]   Remember:  Do not eat food or drink liquids after midnight.  Take these medicines the morning of surgery with A SIP OF WATER  KLOR CON, EYE DROPS, INHALER  (STOP ASPIRIN OR ASPIRIN PRODUCTS, IBUPROFEN/ ADVIL/ MOTRIN/ ALEVE,  GOODY POWDERS, BC'S, HERBAL MEDICINES, MULTIVITAMIN)   Do not wear jewelry, make-up or nail polish.  Do not wear lotions, powders, or perfumes.  You may wear deoderant.  Do not shave 48 hours prior to surgery.  Men may shave face and neck.  Do not bring valuables to the hospital.  [facility] is not responsible for any belongings or valuables.  Contacts, dentures or bridgework may not be worn into surgery.  Leave your suitcase in the car.  After surgery it may be brought to your room.  For patients admitted to the hospital, discharge time will be determined by your treatment team.  Patients discharged the day of surgery will not be allowed to drive home.   Name and phone number of your driver:    Special instructions:  [facility] - Preparing for Surgery  Before surgery, you can play an important role.  Because skin is not sterile, your skin needs to be as free of germs as possible.  You can reduce the number of germs on you skin by washing with CHG (chlorahexidine gluconate) soap before surgery.  CHG is an antiseptic cleaner which kills germs and bonds with the skin to continue killing germs even after washing.  Please DO NOT use if you have an  allergy to CHG or antibacterial soaps.  If your skin becomes reddened/irritated stop using the CHG and inform your nurse when you arrive at Short Stay.  Do not shave (including legs and underarms) for at least 48 hours prior to the first CHG shower.  You may shave your face.  Please follow these instructions carefully:   1.  Shower with CHG Soap the night before surgery and the                                morning of Surgery.  2.  If you choose to wash your hair, wash your hair first as usual with your       normal shampoo.  3.  After you shampoo, rinse your hair and body thoroughly to remove the                      Shampoo.  4.  Use CHG as you would any other liquid soap.  You can apply chg directly       to the skin and wash gently with scrungie or a clean washcloth.  5.  Apply the CHG Soap to your body ONLY FROM THE NECK DOWN.        Do not use on open wounds or open sores.  Avoid contact with your eyes,  ears, mouth and genitals (private parts).  Wash genitals (private parts)       with your normal soap.  6.  Wash thoroughly, paying special attention to the area where your surgery        will be performed.  7.  Thoroughly rinse your body with warm water from the neck down.  8.  DO NOT shower/wash with your normal soap after using and rinsing off       the CHG Soap.  9.  Pat yourself dry with a clean towel.            10.  Wear clean pajamas.            11.  Place clean sheets on your bed the night of your first shower and do not        sleep with pets.  Day of Surgery  Do not apply any lotions/deoderants the morning of surgery.  Please wear clean clothes to the hospital/surgery center.    Please read over the following fact sheets that you were given. Pain Booklet, Coughing and Deep Breathing, MRSA Information and Surgical Site Infection Prevention

## 2016-06-25 NOTE — Telephone Encounter (Signed)
Open error 

## 2016-06-25 NOTE — Progress Notes (Signed)
PATIENT THINKS SHE MAY HAVE HAD STRESS TEST 10 YEARS AGO AT [facility].  SLEEP STUDY WAS ALSO DONE YEARS AGO.

## 2016-06-26 NOTE — Telephone Encounter (Signed)
Left message to return call. dexa scan was normal.

## 2016-06-29 MED ORDER — CEFAZOLIN SODIUM-DEXTROSE 2-4 GM/100ML-% IV SOLN
2.0000 g | INTRAVENOUS | Status: AC
  Administered 2016-07-02: 2 g via INTRAVENOUS
  Filled 2016-06-29: qty 100

## 2016-07-01 NOTE — Anesthesia Preprocedure Evaluation (Signed)
Anesthesia Evaluation  Patient identified by MRN, date of birth, ID band Patient awake    Reviewed: Allergy & Precautions, NPO status , Patient's Chart, lab work & pertinent test results  Airway Mallampati: IV  TM Distance: >3 FB    Comment: Previous grade 1 view with video laryngoscope Dental  (+) Teeth Intact   Pulmonary asthma , sleep apnea , former smoker,    breath sounds clear to auscultation       Cardiovascular hypertension, Pt. on medications  Rhythm:Regular     Neuro/Psych negative neurological ROS     GI/Hepatic   Endo/Other    Renal/GU      Musculoskeletal  (+) Arthritis ,   Abdominal   Peds  Hematology   Anesthesia Other Findings   Reproductive/Obstetrics                             Anesthesia Physical  Anesthesia Plan  ASA: III  Anesthesia Plan: General   Post-op Pain Management:    Induction: Intravenous  Airway Management Planned: Oral ETT and Video Laryngoscope Planned  Additional Equipment:   Intra-op Plan:   Post-operative Plan: Extubation in OR  Informed Consent: I have reviewed the patients History and Physical, chart, labs and discussed the procedure including the risks, benefits and alternatives for the proposed anesthesia with the patient or authorized representative who has indicated his/her understanding and acceptance.   Dental advisory given  Plan Discussed with: Anesthesiologist and CRNA  Anesthesia Plan Comments:         Anesthesia Quick Evaluation

## 2016-07-02 ENCOUNTER — Encounter (HOSPITAL_COMMUNITY)
Admission: RE | Disposition: A | Discharge status: Home Health | Payer: Self-pay | Source: Ambulatory Visit | Attending: Neurological Surgery

## 2016-07-02 ENCOUNTER — Encounter (HOSPITAL_COMMUNITY): Payer: Self-pay | Admitting: *Deleted

## 2016-07-02 ENCOUNTER — Inpatient Hospital Stay (HOSPITAL_COMMUNITY): Payer: Medicare Other | Admitting: Anesthesiology

## 2016-07-02 ENCOUNTER — Inpatient Hospital Stay (HOSPITAL_COMMUNITY): Payer: Medicare Other

## 2016-07-02 ENCOUNTER — Inpatient Hospital Stay (HOSPITAL_COMMUNITY)
Admission: RE | Admit: 2016-07-02 | Discharge: 2016-07-05 | DRG: 472 | Disposition: A | Discharge status: Home Health | Payer: Medicare Other | Source: Ambulatory Visit | Attending: Neurological Surgery | Admitting: Neurological Surgery

## 2016-07-02 DIAGNOSIS — M40202 Unspecified kyphosis, cervical region: Secondary | ICD-10-CM | POA: Diagnosis present

## 2016-07-02 DIAGNOSIS — Z85820 Personal history of malignant melanoma of skin: Secondary | ICD-10-CM | POA: Diagnosis not present

## 2016-07-02 DIAGNOSIS — J45909 Unspecified asthma, uncomplicated: Secondary | ICD-10-CM | POA: Diagnosis present

## 2016-07-02 DIAGNOSIS — Z87442 Personal history of urinary calculi: Secondary | ICD-10-CM | POA: Diagnosis not present

## 2016-07-02 DIAGNOSIS — M4182 Other forms of scoliosis, cervical region: Secondary | ICD-10-CM | POA: Diagnosis present

## 2016-07-02 DIAGNOSIS — Z923 Personal history of irradiation: Secondary | ICD-10-CM

## 2016-07-02 DIAGNOSIS — K219 Gastro-esophageal reflux disease without esophagitis: Secondary | ICD-10-CM | POA: Diagnosis present

## 2016-07-02 DIAGNOSIS — Z91048 Other nonmedicinal substance allergy status: Secondary | ICD-10-CM

## 2016-07-02 DIAGNOSIS — G2581 Restless legs syndrome: Secondary | ICD-10-CM | POA: Diagnosis present

## 2016-07-02 DIAGNOSIS — Z881 Allergy status to other antibiotic agents status: Secondary | ICD-10-CM | POA: Diagnosis not present

## 2016-07-02 DIAGNOSIS — I872 Venous insufficiency (chronic) (peripheral): Secondary | ICD-10-CM | POA: Diagnosis present

## 2016-07-02 DIAGNOSIS — M2578 Osteophyte, vertebrae: Secondary | ICD-10-CM | POA: Diagnosis present

## 2016-07-02 DIAGNOSIS — Z87891 Personal history of nicotine dependence: Secondary | ICD-10-CM

## 2016-07-02 DIAGNOSIS — I10 Essential (primary) hypertension: Secondary | ICD-10-CM | POA: Diagnosis present

## 2016-07-02 DIAGNOSIS — H903 Sensorineural hearing loss, bilateral: Secondary | ICD-10-CM | POA: Diagnosis present

## 2016-07-02 DIAGNOSIS — M4722 Other spondylosis with radiculopathy, cervical region: Secondary | ICD-10-CM | POA: Diagnosis present

## 2016-07-02 DIAGNOSIS — Z7982 Long term (current) use of aspirin: Secondary | ICD-10-CM

## 2016-07-02 DIAGNOSIS — Z91040 Latex allergy status: Secondary | ICD-10-CM

## 2016-07-02 DIAGNOSIS — Z79899 Other long term (current) drug therapy: Secondary | ICD-10-CM | POA: Diagnosis not present

## 2016-07-02 DIAGNOSIS — M4712 Other spondylosis with myelopathy, cervical region: Secondary | ICD-10-CM | POA: Diagnosis present

## 2016-07-02 DIAGNOSIS — Z981 Arthrodesis status: Secondary | ICD-10-CM

## 2016-07-02 DIAGNOSIS — Z9221 Personal history of antineoplastic chemotherapy: Secondary | ICD-10-CM | POA: Diagnosis not present

## 2016-07-02 DIAGNOSIS — Z419 Encounter for procedure for purposes other than remedying health state, unspecified: Secondary | ICD-10-CM

## 2016-07-02 DIAGNOSIS — Z86718 Personal history of other venous thrombosis and embolism: Secondary | ICD-10-CM | POA: Diagnosis not present

## 2016-07-02 DIAGNOSIS — Z09 Encounter for follow-up examination after completed treatment for conditions other than malignant neoplasm: Secondary | ICD-10-CM

## 2016-07-02 DIAGNOSIS — G4733 Obstructive sleep apnea (adult) (pediatric): Secondary | ICD-10-CM | POA: Diagnosis present

## 2016-07-02 DIAGNOSIS — M963 Postlaminectomy kyphosis: Secondary | ICD-10-CM | POA: Diagnosis not present

## 2016-07-02 HISTORY — PX: ANTERIOR CERVICAL DECOMP/DISCECTOMY FUSION: SHX1161

## 2016-07-02 SURGERY — ANTERIOR CERVICAL DECOMPRESSION/DISCECTOMY FUSION 3 LEVELS
Anesthesia: General

## 2016-07-02 MED ORDER — CHLORHEXIDINE GLUCONATE CLOTH 2 % EX PADS: 6.0000 | MEDICATED_PAD | Freq: Once | CUTANEOUS | Status: DC

## 2016-07-02 MED ORDER — BISACODYL 10 MG RE SUPP: 10.0000 mg | Freq: Every day | RECTAL | Status: DC | PRN

## 2016-07-02 MED ORDER — SODIUM CHLORIDE 0.9 % IR SOLN
Status: DC | PRN
  Administered 2016-07-02: 09:00:00

## 2016-07-02 MED ORDER — PROPOFOL 10 MG/ML IV BOLUS
INTRAVENOUS | Status: AC
  Filled 2016-07-02: qty 20

## 2016-07-02 MED ORDER — PHENYLEPHRINE 40 MCG/ML (10ML) SYRINGE FOR IV PUSH (FOR BLOOD PRESSURE SUPPORT)
PREFILLED_SYRINGE | INTRAVENOUS | Status: AC
Start: 2016-07-02 — End: 2016-07-02
  Filled 2016-07-02: qty 20

## 2016-07-02 MED ORDER — ONDANSETRON HCL 4 MG/2ML IJ SOLN: 4.0000 mg | INTRAMUSCULAR | Status: DC | PRN

## 2016-07-02 MED ORDER — POLYETHYLENE GLYCOL 3350 17 G PO PACK: 17.0000 g | PACK | Freq: Every day | ORAL | Status: DC | PRN

## 2016-07-02 MED ORDER — FENTANYL CITRATE (PF) 100 MCG/2ML IJ SOLN
INTRAMUSCULAR | Status: AC
  Filled 2016-07-02: qty 2

## 2016-07-02 MED ORDER — POTASSIUM CHLORIDE CRYS ER 20 MEQ PO TBCR
20.0000 meq | EXTENDED_RELEASE_TABLET | Freq: Every day | ORAL | Status: DC
  Administered 2016-07-02: 20 meq via ORAL
  Filled 2016-07-02 (×2): qty 1

## 2016-07-02 MED ORDER — SODIUM CHLORIDE 0.9% FLUSH: 3.0000 mL | INTRAVENOUS | Status: DC | PRN

## 2016-07-02 MED ORDER — MIDAZOLAM HCL 5 MG/5ML IJ SOLN
INTRAMUSCULAR | Status: DC | PRN
  Administered 2016-07-02: 2 mg via INTRAVENOUS

## 2016-07-02 MED ORDER — GELATIN ABSORBABLE MT POWD
OROMUCOSAL | Status: DC | PRN
  Administered 2016-07-02 (×2): via TOPICAL

## 2016-07-02 MED ORDER — SUCCINYLCHOLINE CHLORIDE 20 MG/ML IJ SOLN
INTRAMUSCULAR | Status: DC | PRN
  Administered 2016-07-02: 120 mg via INTRAVENOUS

## 2016-07-02 MED ORDER — ROCURONIUM BROMIDE 100 MG/10ML IV SOLN
INTRAVENOUS | Status: DC | PRN
  Administered 2016-07-02: 20 mg via INTRAVENOUS
  Administered 2016-07-02: 10 mg via INTRAVENOUS
  Administered 2016-07-02: 50 mg via INTRAVENOUS

## 2016-07-02 MED ORDER — PHENYLEPHRINE HCL 10 MG/ML IJ SOLN
INTRAMUSCULAR | Status: DC | PRN
  Administered 2016-07-02 (×10): 80 ug via INTRAVENOUS

## 2016-07-02 MED ORDER — SENNA 8.6 MG PO TABS
1.0000 | ORAL_TABLET | Freq: Two times a day (BID) | ORAL | Status: DC
  Administered 2016-07-02 – 2016-07-04 (×5): 8.6 mg via ORAL
  Filled 2016-07-02 (×5): qty 1

## 2016-07-02 MED ORDER — METHOCARBAMOL 1000 MG/10ML IJ SOLN
500.0000 mg | Freq: Four times a day (QID) | INTRAVENOUS | Status: DC | PRN
  Filled 2016-07-02: qty 5

## 2016-07-02 MED ORDER — METHOCARBAMOL 500 MG PO TABS
ORAL_TABLET | ORAL | Status: AC
  Filled 2016-07-02: qty 1

## 2016-07-02 MED ORDER — ONDANSETRON HCL 4 MG/2ML IJ SOLN
INTRAMUSCULAR | Status: DC | PRN
  Administered 2016-07-02: 4 mg via INTRAVENOUS

## 2016-07-02 MED ORDER — MENTHOL 3 MG MT LOZG
1.0000 | LOZENGE | OROMUCOSAL | Status: DC | PRN
  Filled 2016-07-02: qty 9

## 2016-07-02 MED ORDER — SODIUM CHLORIDE 0.9% FLUSH
3.0000 mL | Freq: Two times a day (BID) | INTRAVENOUS | Status: DC
  Administered 2016-07-02: 3 mL via INTRAVENOUS

## 2016-07-02 MED ORDER — PHENOL 1.4 % MT LIQD
1.0000 | OROMUCOSAL | Status: DC | PRN
  Administered 2016-07-02: 1 via OROMUCOSAL

## 2016-07-02 MED ORDER — MONTELUKAST SODIUM 10 MG PO TABS
10.0000 mg | ORAL_TABLET | Freq: Every day | ORAL | Status: DC
  Administered 2016-07-02 – 2016-07-03 (×2): 10 mg via ORAL
  Filled 2016-07-02 (×3): qty 1

## 2016-07-02 MED ORDER — SODIUM CHLORIDE 0.9 % IV SOLN: 250.0000 mL | INTRAVENOUS | Status: DC

## 2016-07-02 MED ORDER — MIDAZOLAM HCL 2 MG/2ML IJ SOLN
INTRAMUSCULAR | Status: AC
  Filled 2016-07-02: qty 2

## 2016-07-02 MED ORDER — PROPOFOL 10 MG/ML IV BOLUS
INTRAVENOUS | Status: DC | PRN
  Administered 2016-07-02: 200 mg via INTRAVENOUS

## 2016-07-02 MED ORDER — ALBUTEROL SULFATE HFA 108 (90 BASE) MCG/ACT IN AERS
INHALATION_SPRAY | RESPIRATORY_TRACT | Status: DC | PRN
  Administered 2016-07-02: 2 via RESPIRATORY_TRACT

## 2016-07-02 MED ORDER — LIDOCAINE-EPINEPHRINE 1 %-1:100000 IJ SOLN
INTRAMUSCULAR | Status: DC | PRN
  Administered 2016-07-02: 10 mL

## 2016-07-02 MED ORDER — SUGAMMADEX SODIUM 200 MG/2ML IV SOLN
INTRAVENOUS | Status: DC | PRN
  Administered 2016-07-02: 200 mg via INTRAVENOUS

## 2016-07-02 MED ORDER — EPHEDRINE SULFATE 50 MG/ML IJ SOLN
INTRAMUSCULAR | Status: DC | PRN
  Administered 2016-07-02: 5 mg via INTRAVENOUS
  Administered 2016-07-02 (×2): 10 mg via INTRAVENOUS

## 2016-07-02 MED ORDER — ALBUTEROL SULFATE (2.5 MG/3ML) 0.083% IN NEBU: 3.0000 mL | INHALATION_SOLUTION | RESPIRATORY_TRACT | Status: DC | PRN

## 2016-07-02 MED ORDER — CEFAZOLIN IN D5W 1 GM/50ML IV SOLN
1.0000 g | Freq: Three times a day (TID) | INTRAVENOUS | Status: AC
  Administered 2016-07-02 (×2): 1 g via INTRAVENOUS
  Filled 2016-07-02 (×2): qty 50

## 2016-07-02 MED ORDER — METHOCARBAMOL 500 MG PO TABS
500.0000 mg | ORAL_TABLET | Freq: Four times a day (QID) | ORAL | Status: DC | PRN
  Administered 2016-07-02 – 2016-07-04 (×6): 500 mg via ORAL
  Filled 2016-07-02 (×5): qty 1

## 2016-07-02 MED ORDER — OXYCODONE-ACETAMINOPHEN 5-325 MG PO TABS
1.0000 | ORAL_TABLET | ORAL | Status: DC | PRN
  Administered 2016-07-02 (×3): 2 via ORAL
  Administered 2016-07-03: 1 via ORAL
  Administered 2016-07-03 (×3): 2 via ORAL
  Administered 2016-07-04: 1 via ORAL
  Filled 2016-07-02 (×2): qty 2
  Filled 2016-07-02: qty 1
  Filled 2016-07-02 (×5): qty 2

## 2016-07-02 MED ORDER — HYDROCHLOROTHIAZIDE 12.5 MG PO CAPS
12.5000 mg | ORAL_CAPSULE | Freq: Every day | ORAL | Status: DC
  Administered 2016-07-02: 12.5 mg via ORAL
  Filled 2016-07-02 (×2): qty 1

## 2016-07-02 MED ORDER — LACTATED RINGERS IV SOLN
INTRAVENOUS | Status: DC | PRN
  Administered 2016-07-02 (×2): via INTRAVENOUS

## 2016-07-02 MED ORDER — FENTANYL CITRATE (PF) 100 MCG/2ML IJ SOLN
INTRAMUSCULAR | Status: DC | PRN
  Administered 2016-07-02: 50 ug via INTRAVENOUS
  Administered 2016-07-02: 100 ug via INTRAVENOUS
  Administered 2016-07-02: 50 ug via INTRAVENOUS

## 2016-07-02 MED ORDER — FLEET ENEMA 7-19 GM/118ML RE ENEM: 1.0000 | ENEMA | Freq: Once | RECTAL | Status: DC | PRN

## 2016-07-02 MED ORDER — DEXAMETHASONE SODIUM PHOSPHATE 10 MG/ML IJ SOLN
INTRAMUSCULAR | Status: DC | PRN
  Administered 2016-07-02: 10 mg via INTRAVENOUS

## 2016-07-02 MED ORDER — SODIUM CHLORIDE 0.9 % IV SOLN
INTRAVENOUS | Status: DC
Start: 2016-07-02 — End: 2016-07-05

## 2016-07-02 MED ORDER — THROMBIN 20000 UNITS EX SOLR
CUTANEOUS | Status: DC | PRN
  Administered 2016-07-02: 09:00:00 via TOPICAL

## 2016-07-02 MED ORDER — DOCUSATE SODIUM 100 MG PO CAPS
100.0000 mg | ORAL_CAPSULE | Freq: Two times a day (BID) | ORAL | Status: DC
  Administered 2016-07-02 – 2016-07-04 (×6): 100 mg via ORAL
  Filled 2016-07-02 (×6): qty 1

## 2016-07-02 MED ORDER — FENTANYL CITRATE (PF) 100 MCG/2ML IJ SOLN
INTRAMUSCULAR | Status: AC
  Filled 2016-07-02: qty 4

## 2016-07-02 MED ORDER — ALUM & MAG HYDROXIDE-SIMETH 200-200-20 MG/5ML PO SUSP: 30.0000 mL | Freq: Four times a day (QID) | ORAL | Status: DC | PRN

## 2016-07-02 MED ORDER — LOSARTAN POTASSIUM 50 MG PO TABS
50.0000 mg | ORAL_TABLET | Freq: Every day | ORAL | Status: DC
  Administered 2016-07-02: 50 mg via ORAL
  Filled 2016-07-02 (×2): qty 1

## 2016-07-02 MED ORDER — FENTANYL CITRATE (PF) 100 MCG/2ML IJ SOLN
25.0000 ug | INTRAMUSCULAR | Status: DC | PRN
  Administered 2016-07-02 (×3): 50 ug via INTRAVENOUS

## 2016-07-02 MED ORDER — 0.9 % SODIUM CHLORIDE (POUR BTL) OPTIME
TOPICAL | Status: DC | PRN
  Administered 2016-07-02: 1000 mL

## 2016-07-02 MED ORDER — LIDOCAINE HCL (CARDIAC) 20 MG/ML IV SOLN
INTRAVENOUS | Status: DC | PRN
  Administered 2016-07-02: 100 mg via INTRATRACHEAL

## 2016-07-02 MED ORDER — EPHEDRINE 5 MG/ML INJ
INTRAVENOUS | Status: AC
  Filled 2016-07-02: qty 10

## 2016-07-02 MED ORDER — LOSARTAN POTASSIUM-HCTZ 50-12.5 MG PO TABS: 1.0000 | ORAL_TABLET | Freq: Every day | ORAL | Status: DC

## 2016-07-02 MED ORDER — BUPIVACAINE HCL (PF) 0.25 % IJ SOLN
INTRAMUSCULAR | Status: DC | PRN
  Administered 2016-07-02: 10 mL

## 2016-07-02 MED ORDER — FUROSEMIDE 20 MG PO TABS
20.0000 mg | ORAL_TABLET | ORAL | Status: DC
  Administered 2016-07-02 – 2016-07-04 (×2): 20 mg via ORAL
  Filled 2016-07-02 (×2): qty 1

## 2016-07-02 MED ORDER — PROMETHAZINE HCL 25 MG/ML IJ SOLN: 6.2500 mg | INTRAMUSCULAR | Status: DC | PRN

## 2016-07-02 MED ORDER — ACETAMINOPHEN 325 MG PO TABS: 650.0000 mg | ORAL_TABLET | ORAL | Status: DC | PRN

## 2016-07-02 MED ORDER — MORPHINE SULFATE (PF) 2 MG/ML IV SOLN
1.0000 mg | INTRAVENOUS | Status: DC | PRN
  Administered 2016-07-02: 4 mg via INTRAVENOUS
  Filled 2016-07-02: qty 2

## 2016-07-02 MED ORDER — OXYCODONE-ACETAMINOPHEN 5-325 MG PO TABS
ORAL_TABLET | ORAL | Status: AC
  Filled 2016-07-02: qty 2

## 2016-07-02 MED ORDER — ACETAMINOPHEN 650 MG RE SUPP: 650.0000 mg | RECTAL | Status: DC | PRN

## 2016-07-02 SURGICAL SUPPLY — 63 items
ALLOGRAFT LORDOTIC 8X11X14 (Bone Implant) ×4 IMPLANT
ALLOGRAFT LORDOTIC CC 7X11X14 (Bone Implant) ×2 IMPLANT
BAG DECANTER FOR FLEXI CONT (MISCELLANEOUS) ×2 IMPLANT
BIT DRILL NEURO 2X3.1 SFT TUCH (MISCELLANEOUS) ×1 IMPLANT
BIT DRILL POWER (BIT) ×1 IMPLANT
BNDG GAUZE ELAST 4 BULKY (GAUZE/BANDAGES/DRESSINGS) ×4 IMPLANT
BUR BARREL STRAIGHT FLUTE 4.0 (BURR) ×2 IMPLANT
CANISTER SUCT 3000ML PPV (MISCELLANEOUS) ×2 IMPLANT
CONT SPEC 4OZ CLIKSEAL STRL BL (MISCELLANEOUS) ×2 IMPLANT
DECANTER SPIKE VIAL GLASS SM (MISCELLANEOUS) ×2 IMPLANT
DERMABOND ADVANCED (GAUZE/BANDAGES/DRESSINGS) ×1
DERMABOND ADVANCED .7 DNX12 (GAUZE/BANDAGES/DRESSINGS) ×1 IMPLANT
DRAPE C-ARM 42X72 X-RAY (DRAPES) ×4 IMPLANT
DRAPE HALF SHEET 40X57 (DRAPES) ×2 IMPLANT
DRAPE LAPAROTOMY 100X72 PEDS (DRAPES) ×2 IMPLANT
DRAPE MICROSCOPE LEICA (MISCELLANEOUS) ×2 IMPLANT
DRAPE POUCH INSTRU U-SHP 10X18 (DRAPES) ×2 IMPLANT
DRILL BIT POWER (BIT) ×1
DRILL NEURO 2X3.1 SOFT TOUCH (MISCELLANEOUS) ×2
DURAPREP 6ML APPLICATOR 50/CS (WOUND CARE) ×2 IMPLANT
ELECT REM PT RETURN 9FT ADLT (ELECTROSURGICAL) ×2
ELECTRODE REM PT RTRN 9FT ADLT (ELECTROSURGICAL) ×1 IMPLANT
GAUZE SPONGE 4X4 16PLY XRAY LF (GAUZE/BANDAGES/DRESSINGS) IMPLANT
GLOVE BIO SURGEON STRL SZ7.5 (GLOVE) IMPLANT
GLOVE BIOGEL PI IND STRL 7.5 (GLOVE) IMPLANT
GLOVE BIOGEL PI IND STRL 8.5 (GLOVE) ×1 IMPLANT
GLOVE BIOGEL PI INDICATOR 7.5 (GLOVE)
GLOVE BIOGEL PI INDICATOR 8.5 (GLOVE) ×1
GLOVE ECLIPSE 8.5 STRL (GLOVE) ×2 IMPLANT
GLOVE ECLIPSE 9.0 STRL (GLOVE) ×2 IMPLANT
GLOVE EXAM NITRILE LRG STRL (GLOVE) IMPLANT
GLOVE EXAM NITRILE XL STR (GLOVE) IMPLANT
GLOVE EXAM NITRILE XS STR PU (GLOVE) IMPLANT
GLOVE SURG SS PI 6.5 STRL IVOR (GLOVE) ×4 IMPLANT
GOWN STRL REUS W/ TWL LRG LVL3 (GOWN DISPOSABLE) ×1 IMPLANT
GOWN STRL REUS W/ TWL XL LVL3 (GOWN DISPOSABLE) ×1 IMPLANT
GOWN STRL REUS W/TWL 2XL LVL3 (GOWN DISPOSABLE) ×2 IMPLANT
GOWN STRL REUS W/TWL LRG LVL3 (GOWN DISPOSABLE) ×1
GOWN STRL REUS W/TWL XL LVL3 (GOWN DISPOSABLE) ×1
HALTER HD/CHIN CERV TRACTION D (MISCELLANEOUS) ×2 IMPLANT
HEMOSTAT POWDER KIT SURGIFOAM (HEMOSTASIS) ×4 IMPLANT
KIT BASIN OR (CUSTOM PROCEDURE TRAY) ×2 IMPLANT
KIT ROOM TURNOVER OR (KITS) ×2 IMPLANT
NEEDLE HYPO 22GX1.5 SAFETY (NEEDLE) ×2 IMPLANT
NEEDLE SPNL 22GX3.5 QUINCKE BK (NEEDLE) ×2 IMPLANT
NS IRRIG 1000ML POUR BTL (IV SOLUTION) ×2 IMPLANT
NUVASIVE PIN TEMP FIXATION IMPLANT
PACK LAMINECTOMY NEURO (CUSTOM PROCEDURE TRAY) ×2 IMPLANT
PAD ARMBOARD 7.5X6 YLW CONV (MISCELLANEOUS) ×6 IMPLANT
PATTIES SURGICAL 1X1 (DISPOSABLE) ×2 IMPLANT
PIN ARCHON ACP FIXATION TEMP (EXFIX) ×1
PIN FXSTD TEMP SPNE ARCHON (EXFIX) ×1 IMPLANT
PLATE ARCHON 3-LEVEL 60MM (Plate) ×2 IMPLANT
RUBBERBAND STERILE (MISCELLANEOUS) ×4 IMPLANT
SCREW ARCHON ST VAR 4.0X15MM (Screw) ×8 IMPLANT
SCREW BN 15X4XST VA NS SPN (Screw) ×8 IMPLANT
SLEEVE SURGEON STRL (DRAPES) ×2 IMPLANT
SPONGE INTESTINAL PEANUT (DISPOSABLE) ×2 IMPLANT
SUT VIC AB 3-0 SH 8-18 (SUTURE) ×4 IMPLANT
TOWEL OR 17X24 6PK STRL BLUE (TOWEL DISPOSABLE) ×2 IMPLANT
TOWEL OR 17X26 10 PK STRL BLUE (TOWEL DISPOSABLE) ×2 IMPLANT
TRAY FOLEY CATH 16FRSI W/METER (SET/KITS/TRAYS/PACK) ×2 IMPLANT
WATER STERILE IRR 1000ML POUR (IV SOLUTION) ×2 IMPLANT

## 2016-07-02 NOTE — Addendum Note (Signed)
Addendum  created 07/02/16 1221 by Mariea Clonts, CRNA   Anesthesia Intra Meds edited

## 2016-07-02 NOTE — Progress Notes (Signed)
Patient ID: [name], female   DOB: [birth year], 70 y.o.   [name] vss Left arm somewhat weaker in delt and bicep Incision clean and dry. stable

## 2016-07-02 NOTE — Transfer of Care (Signed)
Immediate Anesthesia Transfer of Care Note  Patient: [name]  Procedure(s) Performed: Procedure(s) with comments: Cervical four- five Cervical five- six Cervical six- seven Anterior cervical decompression/diskectomy/fusion (N/A) - C4-5 C5-6 C6-7 Anterior cervical decompression/diskectomy/fusion  Patient Location: PACU  Anesthesia Type:General  Level of Consciousness: awake, alert  and oriented  Airway & Oxygen Therapy: Patient Spontanous Breathing and Patient connected to nasal cannula oxygen  Post-op Assessment: Report given to RN and Post -op Vital signs reviewed and stable  Post vital signs: Reviewed and stable  Last Vitals:  Vitals:   07/02/16 0653  BP: (!) 116/47  Pulse: 64  Resp: 20  Temp: 36.4 C    Last Pain:  Vitals:   07/02/16 0653  TempSrc: Oral  PainSc:          Complications: No apparent anesthesia complications

## 2016-07-02 NOTE — H&P (Signed)
[name] is an 70 y.o. female.   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 A year ago she underwent a C3-C4 anterior decompression arthrodesis. She is aware at that time and she had great difficulty looking up however after the surgery she notes that this improved substantially. In the months since the surgery however she notes that she's lost the ability to look up she has had evidence of a previous kyphosis across the cervical junction and the cervical thoracic junction and this only seems to be worsening now. After careful consideration of her options I discussed doing anterior decompressions and effort to reduce the kyphosis in the cervical spine and she is aware that she may need a second posterior procedure in an effort to maintain the kyphotic reduction. However this is a first stage and hopefully will give her adequate improvement in her ability to look up. Will monitor carefully should she start to lose that ability.  Past Medical History:  Diagnosis Date  . Anesthesia complication    trouble waking up 2/2 CPAP  . Arthritis   . Asthma in adult   . Cervical spondylosis 2012   [name], [place])  . Choroidal nevus of left eye 03/22/2016  . Chronic venous insufficiency 2016   severe reflux with painful varicosities sees [facility]  . Complication of anesthesia    difficulty coming out due to sleep apnea per pt  . Difficult intubation    PATIENT DENIES   . DVT (deep venous thrombosis) ([facility]) 2011   small, developed after venous ablation  . Family history of adverse reaction to anesthesia    O2 levels drop upon waking   . Hearing loss sensory, bilateral 2013   mod-severe high freq sensorineural ([facility])  . History of kidney stones 1980s  . History of radiation therapy 07/19/11-08/25/2011   RIGHT AXILLARY REGION/METASTATIC  . Hypertension   . Kidney stones   . Melanoma ([place]) 06/29/10    MALIGNANT MELANOMA R SHOULDER/SUPRASCAPULAR BACK s/p interferon chemo and XRT  . Neuromuscular disorder ([facility])    muscle spasms  . OSA (obstructive sleep apnea)    on CPAP  . Pneumonia    2001  . RLS (restless legs syndrome)   . Seasonal allergies   . Tinnitus   . Unspecified vitamin D deficiency 03/18/2013    Past Surgical History:  Procedure Laterality Date  . ABI  2016   WNL  . ANTERIOR CERVICAL DECOMP/DISCECTOMY FUSION N/A 08/22/2015   ANTERIOR CERVICAL DECOMPRESSION FUSION C3/4 - interbody graft and anterior plate; Kristeen Miss, MD  . BREAST BIOPSY Left 2013   BENIGN  . BREAST SURGERY Right 1990   BX   . CARDIOVASCULAR STRESS TEST  2010   normal stress test, EF 66%  . COLONOSCOPY  03/2005   benign polyp, int hem, rpt 5 yrs ([place], [place])  . DEEP AXILLARY SENTINEL NODE BIOPSY / EXCISION  2012   RIGHT  . DEXA  06/2016   WNL  . DILATION AND CURETTAGE OF UTERUS  2010  . ENDOVENOUS ABLATION SAPHENOUS VEIN W/ LASER Left 2010   [facility]  . ENDOVENOUS ABLATION SAPHENOUS VEIN W/ LASER Left 2016   accessory branch [facility] [name])  . ESOPHAGOGASTRODUODENOSCOPY  2006   mod chronic carditis, no H pylori ([place], [place])  . [place]   left  . LUMBAR LAMINECTOMY  2001   L4/5  . MELANOMA EXCISION  2011   Right shoulder, with sentinel lymph node biopsy  . PORT-A-CATH REMOVAL  12/05/2011   Procedure: REMOVAL PORT-A-CATH;  Surgeon: Amber Bookbinder, MD;  Location: [facility];  Service: General;  Laterality: N/A;  left port removal  . PORTACATH PLACEMENT     left subclavian    Family History  Problem Relation Age of Onset  . Cancer Mother 63    uterine  . CAD Father 6    MI  . Hypertension Father 69  . Alzheimer's disease Father 71  . Cancer Cousin     x2, breast  . Diabetes Sister   . Cancer Paternal Grandmother     melanoma, possibly   Social History:  reports that she quit smoking about 50 years ago. Her smoking use included Cigarettes. She  has never used smokeless tobacco. She reports that she drinks alcohol. She reports that she does not use drugs.  Allergies:  Allergies  Allergen Reactions  . Sulfa Antibiotics Itching and Swelling    Facial swelling  . Latex Other (See Comments)    Blisters ONLY HAD REACTION TO TAPE  (??? ONLY ADHESIVE ALLERGY)  . Tape Other (See Comments)    Caused blisters - must use paper tape - same reaction from [facility]    Medications Prior to Admission  Medication Sig Dispense Refill  . acetaminophen (TYLENOL ARTHRITIS PAIN) 650 MG CR tablet Take 1,300 mg by mouth every 8 (eight) hours as needed for pain.     Marland Kitchen aspirin EC 81 MG tablet Take 81 mg by mouth at bedtime. Melanoma prevention    . clotrimazole (LOTRIMIN) 1 % cream Apply 1 application topically 2 (two) times daily. (Patient taking differently: Apply 1 application topically 2 (two) times daily as needed (for itching). ) 45 g 0  . furosemide (LASIX) 20 MG tablet Take 20 mg by mouth every other day.    Marland Kitchen KLOR-CON M10 10 MEQ tablet TAKE 2 TABLETS BY MOUTH DAILY. (Patient taking differently: TAKE 2 TABLETS (20 mEq) BY MOUTH DAILY.) 180 tablet 3  . losartan-hydrochlorothiazide (HYZAAR) 50-12.5 MG tablet TAKE 1 TABLET BY MOUTH DAILY. 30 tablet 5  . montelukast (SINGULAIR) 10 MG tablet Take 10 mg by mouth at bedtime.    . Multiple Vitamin (MULTIVITAMIN WITH MINERALS) TABS tablet Take 1 tablet by mouth daily.    . Polyvinyl Alcohol-Povidone (TEARS PLUS OP) Place 1 drop into both eyes daily as needed (dry eyes/ redness/ burning).    Marland Kitchen PRESCRIPTION MEDICATION CPAP    . Probiotic Product (PROBIOTIC DAILY PO) Take 1 tablet by mouth daily.     . Vitamin D, Ergocalciferol, (DRISDOL) 50000 units CAPS capsule TAKE 1 CAPSULE (50,000 UNITS TOTAL) BY MOUTH EVERY 7 (SEVEN) DAYS. (Patient taking differently: TAKE 1 CAPSULE (50,000 UNITS TOTAL) BY MOUTH EVERY Wednesday) 4 capsule 3  . nystatin cream (MYCOSTATIN) Apply 1 application topically 2 (two) times daily.  (Patient not taking: Reported on 06/22/2016) 30 g 0  . PROAIR HFA 108 (90 Base) MCG/ACT inhaler INHALE 2 PUFFS INTO LUNGS EVERY 6 HOURS AS NEEDED FOR WHEEZING OR SHORTNESS OF BREATH 8.5 Inhaler 3    No results found for this or any previous visit (from the past 48 hour(s)). No results found.  Review of Systems  HENT: Negative.   Eyes: Negative.   Respiratory: Negative.   Cardiovascular: Negative.   Gastrointestinal: Negative.   Genitourinary: Negative.   Musculoskeletal: Positive for neck pain.       Inability to look up  Skin: Negative.   Neurological: Positive for weakness.  Endo/Heme/Allergies: Negative.   Psychiatric/Behavioral: Negative.     Blood pressure (!) 116/47, pulse 64, temperature 97.5 F (36.4 C), temperature source Oral, resp. rate 20, height 5' 2.5" (1.588 m), weight 96.2 kg (212 lb), SpO2 97 %. Physical Exam  Constitutional: She is oriented to person, place, and time. She appears well-developed and well-nourished.  HENT:  Head: Normocephalic and atraumatic.  Eyes: Conjunctivae are normal. Pupils are equal, round, and reactive to light.  Neck:  Patient has kyphosis across her cervical spine and the cervical thoracic junction  Cardiovascular: Normal rate and regular rhythm.   Respiratory: Effort normal and breath sounds normal.  GI: Soft. Bowel sounds are normal.  Musculoskeletal:  Kyphosis across cervicothoracic junction inability to look beyond neutral position straightahead flexion no problem.  Neurological: She is alert and oriented to person, place, and time. She has normal reflexes.  Skin: Skin is warm and dry.  Psychiatric: She has a normal mood and affect. Her behavior is normal. Judgment and thought content normal.     Assessment/Plan Cervical kyphoscoliosis with radiculopathy and myelopathy.  Anterior cervical decompression arthrodesis C4-5 C5-6 and C6-C7.  Earleen Newport, MD 07/02/2016, 7:22 AM

## 2016-07-02 NOTE — Op Note (Signed)
Date of surgery: 07/02/2016  Preoperative diagnosis: Cervical spondylosis with radiculopathy and kyphosis C4-5 C5-6 C6-7, status post anterior decompression arthrodesis C3-C4 Post operative diagnosis: Same Procedure: Anterior cervical discectomy decompression of nerve roots and spinal canal C4-5 C5-6 C6-C7 arthrodesis with structural allograft, nuvasive plate fixation [id number] Surgeon: Kristeen Miss M.D. Asst.: Dr. Trenton Gammon Indications: [name] is a 70 year old individual who's had significant problems with cervical spondylosis and myelopathy. A year ago she underwent anterior decompression arthrodesis at the level of C3-C4. She tolerated that well but soon started developed progressive kyphosis that was worsening. She notes that she's not been able to see her self and the mirror for some time now and his process seems to be getting worse. She has evidence of progressive kyphosis at the levels of C4-5 C5-6 and C6-C7 and across the cervical thoracic junction. Anterior decompression is now being planned at C4-5 C5-6 and C6-C7. His been discussed with the patient that she may need a second stage procedure from posterior approach in order to gain more reduction of her progressive kyphosis   Procedure: The patient was brought to the operating room placed on the table in supine position. After the smooth induction of general endotracheal anesthesia neck was placed in 5 pounds of halter traction and prepped with alcohol and DuraPrep. After sterile draping and appropriate timeout procedure a transverse incision was created in the left side of the neck and carried down to the platysma. The plane between the sternocleidomastoid and strap muscles dissected bluntly until the prevertebral space was reached. The first identifiable disc space was noted to be C4-5 on a localizing radiograph. The dissection was then undertaken in the longus coli muscle to allow placement of a self-retaining Caspar type retractor. The  plate was identified at C3-4 and this was removed.  The anterior longitudinal ligament was opened at C4-5 and ventral osteophytes were removed with a Leksell rongeur and Kerrison punch. Interspace was cleared of significant quantity of the degenerated disc material in the region of the posterior longitudinal ligament was removed. Dissection was carried out using a high-speed drill and 3-0 Karlin curettes. Uncinate processes were drilled down and removed and osteophytes from the inferior margin of the body of C4 were removed with a Kerrison 2 mm gold punch. After the central canal and lateral recesses were well decompressed hemostasis was achieved with the bipolar cautery and some small pledgets of Gelfoam soaked in thrombin that were later irrigated away.  An 8 mm cortical ring allograft with 7 lordosis was placed into the interspace. C6-C7 Was decompressed and fused in a similar fashion. C5-C6 Was then decompressed and also fused in a similar fashion.  Next the retractor was removed and a 16 mm nuvasive plate was placed over the vertebral bodies and secured with 15 mm variable angle screws. A final localizing radiograph identified the position of the surgical construct. The stasis was achieved in the soft tissues and then the platysma was closed with 3-0 Vicryl in an interrupted fashion and 3-0 Vicryl was used in the subcuticular tissue. Blood loss was estimated at 250 mL

## 2016-07-02 NOTE — Anesthesia Procedure Notes (Signed)
Procedure Name: Intubation Date/Time: 07/02/2016 7:45 AM Performed by: Mariea Clonts Pre-anesthesia Checklist: Emergency Drugs available, Patient identified, Suction available, Patient being monitored and Timeout performed Patient Re-evaluated:Patient Re-evaluated prior to inductionOxygen Delivery Method: Circle system utilized Preoxygenation: Pre-oxygenation with 100% oxygen Intubation Type: IV induction Ventilation: Mask ventilation without difficulty Laryngoscope Size: Glidescope Grade View: Grade I Tube type: Oral Tube size: 7.5 mm Number of attempts: 1 Airway Equipment and Method: Video-laryngoscopy Placement Confirmation: ETT inserted through vocal cords under direct vision,  breath sounds checked- equal and bilateral and positive ETCO2 Tube secured with: Tape Dental Injury: Teeth and Oropharynx as per pre-operative assessment

## 2016-07-02 NOTE — Anesthesia Postprocedure Evaluation (Signed)
Anesthesia Post Note  Patient: [name]  Procedure(s) Performed: Procedure(s) (LRB): Cervical four- five Cervical five- six Cervical six- seven Anterior cervical decompression/diskectomy/fusion (N/A)  Patient location during evaluation: PACU Anesthesia Type: General Level of consciousness: awake and alert Pain management: pain level controlled Vital Signs Assessment: post-procedure vital signs reviewed and stable Respiratory status: spontaneous breathing, nonlabored ventilation, respiratory function stable and patient connected to nasal cannula oxygen Cardiovascular status: blood pressure returned to baseline and stable Postop Assessment: no signs of nausea or vomiting Anesthetic complications: no    Last Vitals:  Vitals:   07/02/16 1145 07/02/16 1149  BP:  121/65  Pulse: 82 80  Resp: 18 13  Temp:      Last Pain:  Vitals:   07/02/16 0653  TempSrc: Oral  PainSc:                  Reginal Lutes

## 2016-07-03 ENCOUNTER — Encounter (HOSPITAL_COMMUNITY): Payer: Self-pay | Admitting: Neurological Surgery

## 2016-07-03 LAB — CBC
HCT: 39.3 % (ref 36.0–46.0)
Hemoglobin: 12.4 g/dL (ref 12.0–15.0)
MCH: 30.1 pg (ref 26.0–34.0)
MCHC: 31.6 g/dL (ref 30.0–36.0)
MCV: 95.4 fL (ref 78.0–100.0)
Platelets: 188 10*3/uL (ref 150–400)
RBC: 4.12 MIL/uL (ref 3.87–5.11)
RDW: 12.8 % (ref 11.5–15.5)
WBC: 11.8 10*3/uL — ABNORMAL HIGH (ref 4.0–10.5)

## 2016-07-03 LAB — BASIC METABOLIC PANEL
Anion gap: 9 (ref 5–15)
BUN: 14 mg/dL (ref 6–20)
CO2: 27 mmol/L (ref 22–32)
Calcium: 9.2 mg/dL (ref 8.9–10.3)
Chloride: 102 mmol/L (ref 101–111)
Creatinine, Ser: 0.73 mg/dL (ref 0.44–1.00)
GFR calc Af Amer: >60 mL/min (ref >60)
GFR calc non Af Amer: >60 mL/min (ref >60)
Glucose, Bld: 115 mg/dL — ABNORMAL HIGH (ref 65–99)
Potassium: 3.4 mmol/L — ABNORMAL LOW (ref 3.5–5.1)
Sodium: 138 mmol/L (ref 135–145)

## 2016-07-03 NOTE — Progress Notes (Signed)
Patient ID: [name], female   DOB: October 02, 1946, 70 y.o.   [name] Vital signs are stable Left arm remains weak Patient has weakness in deltoid and and pack Physical therapy will continue to work with her

## 2016-07-03 NOTE — Evaluation (Addendum)
Physical Therapy Evaluation Patient Details Name: [name] MRN: [id number] DOB: September 20, 1946 Today's Date: 07/03/2016   History of Present Illness  70 yo admitteed for ACDF 4-7 with prior ACDF C3-4 1 year ago. PMHx: arthritis, HTN, DVT, vascular insufficiency, melanoma, HOH  Clinical Impression  Pt pleasant with limited home environment since she lives in an RV and limited family support. Pt with decreased strength LUE as well as feeling unsteady with gait. Pt educated for cervical restrictions with handout and recommendation for quad cane with gait as well as additional therapy acutely. Pt with decreased strength, mobility and gait who will benefit from acute therapy to maximize mobility, function and independence for safe discharge.     Follow Up Recommendations Home health PT    Equipment Recommendations  Cane;3in1 (PT) (quad cane)    Recommendations for Other Services       Precautions / Restrictions Precautions Precautions: Cervical;Fall Required Braces or Orthoses: Cervical Brace Cervical Brace: Hard collar;At all times      Mobility  Bed Mobility Overal bed mobility: Needs Assistance Bed Mobility: Rolling;Sidelying to Sit Rolling: Supervision Sidelying to sit: Supervision       General bed mobility comments: cues for sequence to roll fully prior to sitting  Transfers Overall transfer level: Needs assistance   Transfers: Sit to/from Stand Sit to Stand: Min guard         General transfer comment: pt with initial posterior stepping due to LOB on standing  Ambulation/Gait Ambulation/Gait assistance: Supervision Ambulation Distance (Feet): 150 Feet Assistive device: None Gait Pattern/deviations: Step-through pattern   Gait velocity interpretation: Below normal speed for age/gender General Gait Details: pt with generally steady gait, slow speed and reports feeling unsteady  Stairs Stairs: Yes Stairs assistance: Supervision Stair Management: Step to  pattern;Forwards;One rail Right Number of Stairs: 4    Wheelchair Mobility    Modified Rankin (Stroke Patients Only)       Balance                                             Pertinent Vitals/Pain Pain Assessment: 0-10 Pain Score: 4  Pain Location: incision Pain Descriptors / Indicators: Sore Pain Intervention(s): Limited activity within patient's tolerance;Monitored during session;Repositioned    Home Living Family/patient expects to be discharged to:: Private residence Living Arrangements: Alone Available Help at Discharge: Family;Available 24 hours/day Type of Home: Mobile home (RV with room added on) Home Access: Stairs to enter Entrance Stairs-Rails: Right;Left;Can reach both Entrance Stairs-Number of Steps: 4 Home Layout: One level Home Equipment: Walker - standard;Adaptive equipment Additional Comments: Pt lives in an [place] and 2steps to be able to access craft room. Pt also reports she has 4 cats. Sister lives across the driveway in a garage. Pt sleeps on couch    Prior Function Level of Independence: Independent               Hand Dominance   Dominant Hand: Left    Extremity/Trunk Assessment   Upper Extremity Assessment: LUE deficits/detail       LUE Deficits / Details: 2+/5 shoulder flexion and abduction, as well as elbow flexion, defer full assessment to OT   Lower Extremity Assessment: Overall WFL for tasks assessed;LLE deficits/detail;RLE deficits/detail RLE Deficits / Details: pain and decreased sensation of bil ankles    Cervical / Trunk Assessment: Other exceptions  Communication   Communication: No  difficulties  Cognition Arousal/Alertness: Awake/alert Behavior During Therapy: WFL for tasks assessed/performed Overall Cognitive Status: Within Functional Limits for tasks assessed                      General Comments      Exercises        Assessment/Plan    PT Assessment Patient needs  continued PT services  PT Diagnosis Difficulty walking;Acute pain   PT Problem List Decreased strength;Decreased mobility;Decreased activity tolerance;Decreased balance;Pain  PT Treatment Interventions Balance training;Functional mobility training;Therapeutic exercise;Gait training;DME instruction;Therapeutic activities;Patient/family education   PT Goals (Current goals can be found in the Care Plan section) Acute Rehab PT Goals Patient Stated Goal: return to crafts and my cats PT Goal Formulation: With patient Time For Goal Achievement: 07/10/16 Potential to Achieve Goals: Good    Frequency Min 5X/week   Barriers to discharge Decreased caregiver support      Co-evaluation               End of Session Equipment Utilized During Treatment: Cervical collar;Gait belt Activity Tolerance: Patient tolerated treatment well Patient left: in chair;with call bell/phone within reach Nurse Communication: Mobility status;Precautions         Time: [id number] PT Time Calculation (min) (ACUTE ONLY): 38 min   Charges:   PT Evaluation $PT Eval Moderate Complexity: 1 Procedure PT Treatments $Therapeutic Activity: 8-22 mins   PT G CodesMelford Aase 07/03/2016, 9:19 AM Elwyn Reach, [place]

## 2016-07-03 NOTE — Progress Notes (Signed)
Pt instructed on how to place cpap (home mask) on with limited mobility of her hands and arms.  Pt agreed to call if she needed assistance from RT.  RT will monitor.

## 2016-07-03 NOTE — Evaluation (Signed)
Occupational Therapy Evaluation Patient Details Name: NYALA CUNDIFF [name] DOB: [birth year] Today's Date: 07/03/2016    History of Present Illness 70 yo admitteed for ACDF 4-7 with prior ACDF C3-4 1 year ago. PMHx: arthritis, HTN, DVT, vascular insufficiency, melanoma, HOH   Clinical Impression   Patient is s/p ACDF C 4-7 surgery resulting in functional limitations due to the deficits listed below (see OT problem list). PTA was independent and sews as a hobby.  Patient will benefit from skilled OT acutely to increase independence and safety with ADLS to allow discharge HHOT/ aide/ RN. Pt requesting (A) for home due to living alone. Pt does not want SNF placement at this time due to sick cat at home. Pt very much wants to return home at this time.       Follow Up Recommendations  Home health OT;Other (comment) (home health aide/ RN)    Equipment Recommendations  3 in 1 bedside comode;Other (comment) (quad cane)    Recommendations for Other Services       Precautions / Restrictions Precautions Precautions: Cervical;Fall Required Braces or Orthoses: Cervical Brace Cervical Brace: Hard collar;At all times      Mobility Bed Mobility Overal bed mobility: Needs Assistance Bed Mobility: Rolling;Sidelying to Sit Rolling: Supervision Sidelying to sit: Supervision       General bed mobility comments: in chair on arrival. ( see PT notes)  Transfers Overall transfer level: Needs assistance   Transfers: Sit to/from Stand Sit to Stand: Min guard         General transfer comment: pt reports I feel more stable now that the medication is wearing off some    Balance                                            ADL Overall ADL's : Needs assistance/impaired Eating/Feeding: Set up;Sitting Eating/Feeding Details (indicate cue type and reason): pt able to stabilize with L UE with elbow supported and open with R hand. pt demonstrates deficit with L UE  placement to (A) the R UE Grooming: Wash/dry face;Set up;Sitting Grooming Details (indicate cue type and reason): pt able to complete hand hygiene with extra effort. pt reports I feel like I should be touching the sink. pt undershooting reaching for faucet.  Upper Body Bathing: Moderate assistance   Lower Body Bathing: Maximal assistance           Toilet Transfer: Min guard             General ADL Comments: pt unable to reach head or R side with L UE. Pt will require (A)      Vision     Perception     Praxis      Pertinent Vitals/Pain Pain Assessment: 0-10 Pain Score: 4  Pain Location: neck Pain Descriptors / Indicators: Operative site guarding Pain Intervention(s): Monitored during session;Premedicated before session;Limited activity within patient's tolerance;Repositioned     Hand Dominance Left   Extremity/Trunk Assessment Upper Extremity Assessment Upper Extremity Assessment: LUE deficits/detail LUE Deficits / Details: 3 out 5 shoulder flexion grasp 5 out 5, pt unable to sustain shoulder flexion, elbow 5 out 5 flexion/ extension. pt demonstrates weakness at shoulder LUE Coordination: decreased gross motor   Lower Extremity Assessment Lower Extremity Assessment: Defer to PT evaluation RLE Deficits / Details: pain and decreased sensation of bil ankles   Cervical / Trunk Assessment  Cervical / Trunk Assessment: Other exceptions Cervical / Trunk Exceptions: s/p surg. educated on don doff ccollar. pt able to demonstrate with min (A) . Pt plans to have sister (A)   Communication Communication Communication: No difficulties   Cognition Arousal/Alertness: Awake/alert Behavior During Therapy: WFL for tasks assessed/performed Overall Cognitive Status: Within Functional Limits for tasks assessed                     General Comments       Exercises       Shoulder Instructions      Home Living Family/patient expects to be discharged to:: Private  residence Living Arrangements: Alone Available Help at Discharge: Family;Available 24 hours/day Type of Home: Mobile home Home Access: Stairs to enter Entrance Stairs-Number of Steps: 4 Entrance Stairs-Rails: Right;Left;Can reach both Home Layout: One level     Bathroom Shower/Tub: Occupational psychologist: Standard Bathroom Accessibility: No   Home Equipment: Walker - standard;Adaptive equipment Adaptive Equipment: Reacher Additional Comments: Pt lives in an [place] and 2steps to be able to access craft room. Pt also reports she has 4 cats. Sister lives across the driveway in a garage. Pt sleeps on couch. pt purchased a wing back chair to sit in. pt has nephew that cleans cat liter box and help with feeding the 2 cats that eat on the floor.       Prior Functioning/Environment Level of Independence: Independent        Comments: pt sews doll clothes    OT Diagnosis: Generalized weakness;Acute pain   OT Problem List: Decreased strength;Decreased range of motion;Decreased activity tolerance;Impaired balance (sitting and/or standing);Decreased coordination;Decreased safety awareness;Decreased knowledge of use of DME or AE;Decreased knowledge of precautions;Pain;Obesity;Impaired UE functional use   OT Treatment/Interventions: Self-care/ADL training;Therapeutic exercise;Neuromuscular education;DME and/or AE instruction;Therapeutic activities;Patient/family education;Balance training    OT Goals(Current goals can be found in the care plan section) Acute Rehab OT Goals Patient Stated Goal: return to crafts and my cats ( does not want SNF because 2 of cats are very sick and fearful they will pass if she does not return home) OT Goal Formulation: With patient Time For Goal Achievement: 07/17/16 Potential to Achieve Goals: Good ADL Goals Pt/caregiver will Perform Home Exercise Program: Left upper extremity;With Supervision;With written HEP provided Additional ADL Goal #1: Pt  will complete grooming task mod I at sink level  OT Frequency: Min 2X/week   Barriers to D/C: Decreased caregiver support  requesting RN to help with care upon home        Co-evaluation              End of Session Equipment Utilized During Treatment: Gait belt;Cervical collar Nurse Communication: Mobility status;Precautions  Activity Tolerance: Patient tolerated treatment well Patient left: in chair;with call bell/phone within reach   Time: 0827-0849 OT Time Calculation (min): 22 min Charges:  OT General Charges $OT Visit: 1 Procedure OT Evaluation $OT Eval Moderate Complexity: 1 Procedure G-Codes:    Parke Poisson B 07/17/2016, 9:51 AM  Jeri Modena   OTR/L [phone] Office: [phone] .

## 2016-07-03 NOTE — Progress Notes (Signed)
Patient ID: [name], female   DOB: [birth year], 70 y.o.   MRN: [id number] Left deltoid is weak. Left pack may be also week. Motor function good distally. Patient notes that she feels wobbly on her feet. Last for physical therapy to assess. Mobilizing slowly. Incision is clean and dry. Swallowing function is good

## 2016-07-04 ENCOUNTER — Inpatient Hospital Stay (HOSPITAL_COMMUNITY): Payer: Medicare Other

## 2016-07-04 MED ORDER — DEXAMETHASONE SODIUM PHOSPHATE 4 MG/ML IJ SOLN: 8.0000 mg | Freq: Once | INTRAMUSCULAR | Status: DC

## 2016-07-04 MED ORDER — DEXAMETHASONE 4 MG PO TABS
8.0000 mg | ORAL_TABLET | Freq: Once | ORAL | Status: AC
  Administered 2016-07-04: 8 mg via ORAL
  Filled 2016-07-04: qty 2

## 2016-07-04 NOTE — Progress Notes (Signed)
Physical Therapy Treatment Patient Details Name: SAYDE TOP [name] DOB: Jun 03, 1946 Today's Date: 07/04/2016    History of Present Illness 70 yo admitteed for ACDF 4-7 with prior ACDF C3-4 1 year ago. PMHx: arthritis, HTN, DVT, vascular insufficiency, melanoma, HOH    PT Comments    Initiated gait with quad cane and continues with slow guarded gait, but steadier.  Pt requesting review of precautions and reviewed them to her satisfaction.  Con't to recommend HHPT to assess mobility in home environment.  Follow Up Recommendations  Home health PT     Equipment Recommendations  Other (comment);Cane (Quad cane has been delivered.  3-1 too big for bathroom)    Recommendations for Other Services       Precautions / Restrictions Precautions Precautions: Cervical;Fall Precaution Comments: reviewed handout Required Braces or Orthoses: Cervical Brace Cervical Brace: Hard collar;At all times Restrictions Weight Bearing Restrictions: No    Mobility  Bed Mobility               General bed mobility comments: sitting up in recliner upon arrival  Transfers Overall transfer level: Needs assistance Equipment used: Quad cane Transfers: Sit to/from Stand Sit to Stand: Min guard            Ambulation/Gait Ambulation/Gait assistance: Min Dispensing optician (Feet): 150 Feet Assistive device: Quad cane Gait Pattern/deviations: Step-through pattern Gait velocity: decreased   General Gait Details: Instructed in use of quad cane and as gait progressed she became more comfortable and confident in its use.  No LOB, but slow gait pattern   Stairs Stairs: Yes Stairs assistance: Supervision Stair Management: One rail Left;Sideways;Step to pattern Number of Stairs: 4 General stair comments: Pt states she normally goes sideways.  Instructed to have family take quad cane inside for her.  Wheelchair Mobility    Modified Rankin (Stroke Patients Only)       Balance Overall balance assessment: Needs assistance Sitting-balance support: Bilateral upper extremity supported;Feet supported         Standing balance-Leahy Scale: Fair                      Cognition Arousal/Alertness: Awake/alert Behavior During Therapy: WFL for tasks assessed/performed Overall Cognitive Status: Within Functional Limits for tasks assessed                      Exercises      General Comments General comments (skin integrity, edema, etc.): Pt c/o soreness where plastic portion of neck brace is on chest. Instructed to put thin piece of fabric between.      Pertinent Vitals/Pain Pain Assessment: Faces Faces Pain Scale: Hurts a little bit Pain Location: chest where brace touches Pain Descriptors / Indicators: Sore Pain Intervention(s): Monitored during session    Home Living                      Prior Function            PT Goals (current goals can now be found in the care plan section) Acute Rehab PT Goals Patient Stated Goal: return to crafts and my cats ( does not want SNF because 2 of cats are very sick and fearful they will pass if she does not return home) PT Goal Formulation: With patient Time For Goal Achievement: 07/10/16 Potential to Achieve Goals: Good Progress towards PT goals: Progressing toward goals    Frequency  Min 5X/week    PT  Plan Current plan remains appropriate    Co-evaluation             End of Session Equipment Utilized During Treatment: Cervical collar;Gait belt Activity Tolerance: Patient tolerated treatment well Patient left: in chair;with call bell/phone within reach     Time: 1135-1200 PT Time Calculation (min) (ACUTE ONLY): 25 min  Charges:  $Gait Training: 8-22 mins $Therapeutic Activity: 8-22 mins                    G Codes:      Merrell Borsuk LUBECK 07/04/2016, 12:30 PM

## 2016-07-04 NOTE — Progress Notes (Signed)
Patient ID: [name], female   DOB: 25-[name]-1946, 70 y.o.   [name] Vital signs are stable Motor function is intact Incision is clean and dry Left arm weakness persists Will have additional physical therapy Plan for discharge tomorrow Awaiting results of x-ray

## 2016-07-04 NOTE — Care Management Note (Signed)
Case Management Note  Patient Details  Name: [name] MRN: [id number] Date of Birth: [birth year]  Subjective/Objective: 70 yr old female s/p ACDF C4-7                   Action/Plan: Case manager spoke with patient concerning [place] and DME needs. Choice was offered for [facility]. Referral was called to [place], [facility] Liaison. Patient will have family support at discharge.   Expected Discharge Date:    07/04/16              Expected Discharge Plan:  [place]  In-House Referral:  NA  Discharge planning Services  CM Consult  Post Acute Care Choice:  Home Health, Durable Medical Equipment Choice offered to:  Patient  DME Arranged:  Kasandra Knudsen DME Agency:     HH Arranged:  PT, OT, Nurse's Aide [place] Agency:  [place]  Status of Service:  Completed, signed off  If discussed at [place] of Stay Meetings, dates discussed:    Additional Comments:  Ninfa Meeker, RN 07/04/2016, 10:37 AM

## 2016-07-04 NOTE — Progress Notes (Signed)
Occupational Therapy Treatment Patient Details Name: [name] MRN: [id number] DOB: 05-11-1946 Today's Date: 07/04/2016    History of present illness 70 yo admitteed for ACDF 4-7 with prior ACDF C3-4 1 year ago. PMHx: arthritis, HTN, DVT, vascular insufficiency, melanoma, HOH   OT comments  Family measured bathroom and 3n1 will not fit. However new residual height RV toilet being installed at elevated height compared to current toilet. Pt with simulation during session and able to complete transfer. Pt with minimal improvement in L UE during grooming at sink task. Pt attempting to use R UE more and encouraged to make L UE involved in all adls. Next session OT to provide Bed level L UE exercises to help with strengthening.    Follow Up Recommendations  Home health OT;Other (comment)    Equipment Recommendations  Other (comment) (quad cane)    Recommendations for Other Services      Precautions / Restrictions Precautions Precautions: Cervical;Fall Required Braces or Orthoses: Cervical Brace Cervical Brace: Hard collar;At all times       Mobility Bed Mobility               General bed mobility comments: EOB on arrival . HOB fully up to help patient to EOB observed  Transfers Overall transfer level: Needs assistance   Transfers: Sit to/from Stand Sit to Stand: Min guard              Balance Overall balance assessment: Needs assistance Sitting-balance support: Bilateral upper extremity supported;Feet supported         Standing balance-Leahy Scale: Fair                     ADL Overall ADL's : Needs assistance/impaired Eating/Feeding: Supervision/ safety;Sitting Eating/Feeding Details (indicate cue type and reason): pt able to open packets and setup tray  Grooming: Wash/dry hands;Min guard;Standing           Upper Body Dressing : Moderate assistance Upper Body Dressing Details (indicate cue type and reason): unable to don gown as a robe . pt  states "i can't get it around"   Lower Body Dressing Details (indicate cue type and reason): don shoes MOD I ( slide on) Toilet Transfer: Supervision/safety;BSC;Grab bars Toilet Transfer Details (indicate cue type and reason): pt with bathroom simulated home setup. pt able to lean on L grab bar and push up with R UE simulating home surface ( RW placed so bar is used to simulate). Pt reports "i dont think ill have trouble now that we have practiced.          Functional mobility during ADLs: Min guard General ADL Comments: pt able to complete sit<.stand from bed, toilet transfer, sink level grooming and tranfer to chair for eating. pt could benefit from further L Ue exercises  for scapula retraction, elevation , shoulder flexion and extension next session. Transport arriving to take to xray at this time.       Vision                     Perception     Praxis      Cognition   Behavior During Therapy: [facility] for tasks assessed/performed Overall Cognitive Status: Within Functional Limits for tasks assessed                       Extremity/Trunk Assessment               Exercises  Shoulder Instructions       General Comments      Pertinent Vitals/ Pain       Pain Assessment: No/denies pain  Home Living                                          Prior Functioning/Environment              Frequency Min 2X/week     Progress Toward Goals  OT Goals(current goals can now be found in the care plan section)  Progress towards OT goals: Progressing toward goals  Acute Rehab OT Goals Patient Stated Goal: return to crafts and my cats ( does not want SNF because 2 of cats are very sick and fearful they will pass if she does not return home) OT Goal Formulation: With patient Time For Goal Achievement: 07/17/16 Potential to Achieve Goals: Good ADL Goals Pt/caregiver will Perform Home Exercise Program: Left upper extremity;With  Supervision;With written HEP provided Additional ADL Goal #1: Pt will complete grooming task mod I at sink level  Plan Discharge plan remains appropriate    Co-evaluation                 End of Session Equipment Utilized During Treatment: Cervical collar   Activity Tolerance Patient tolerated treatment well   Patient Left in chair;with call bell/phone within reach   Nurse Communication Mobility status;Precautions        Time: [id number] OT Time Calculation (min): 17 min  Charges: OT General Charges $OT Visit: 1 Procedure OT Treatments $Self Care/Home Management : 8-22 mins  Parke Poisson B 07/04/2016, 9:28 AM   Jeri Modena   OTR/L Pager: [phone] Office: [phone] .

## 2016-07-05 MED ORDER — METHOCARBAMOL 500 MG PO TABS: 500.0000 mg | ORAL_TABLET | Freq: Four times a day (QID) | ORAL | 3 refills | Status: DC

## 2016-07-05 MED ORDER — DEXAMETHASONE 1 MG PO TABS: ORAL_TABLET | ORAL | 0 refills | Status: DC

## 2016-07-05 NOTE — Progress Notes (Signed)
Pt. discharged home accompanied by husband. Prescriptions and discharge instructions given with verbalization of understanding. Incision site on neck with no s/s of infection - no swelling, redness, bleeding, and/or drainage noted. Opportunity given to ask questions but no question asked. Pt. transported out of this unit in wheelchair by the volunteer

## 2016-07-05 NOTE — Discharge Summary (Signed)
Physician Discharge Summary  Patient ID: LEHLANI MONTER [name] DOB/AGE: 12-14-1945 70 y.o.  Admit date: 07/02/2016 Discharge date: 07/05/2016  Admission Diagnoses:Cervical spondylosis with myelopathy and kyphosisC 45 C5-6 C6-7  Discharge Diagnoses: Cervical spondylosis with myelopathy and kyphosis C4-5 C5-6 and C6-C7 Active Problems:   Kyphosis of cervical region   Discharged Condition: fair  Hospital Course: Patient was admitted to undergo surgical decompression and reduction of a severe kyphosis that rendered her unable to look straight ahead. She has had a previous decompression and fusion at level of C3-C4 she tolerated this well but correction and she gained then was gradually lost over time. Postoperatively she had weakness in her left deltoid and the pectoralis muscle made use of the left arm decreased she also had some numbness and dysesthesias in the fingers. Plain radiographs demonstrate that she had 27 of correction from kyphosis 2 lordosis across C4-C7. At the current time she is improving with her level of function from the weakness in her left arm. Incision is clean and dry. Swallowing is not a problem.  Consults: None  Significant Diagnostic Studies: Plain postoperative x-rays of the cervical spine demonstrating reduction of the kyphosis by 27  Treatments: surgery: Anterior cervical decompression arthrodesis C4-5 C5-6 and C6-C7  Discharge Exam: Blood pressure (!) 142/84, pulse 63, temperature 98.5 F (36.9 C), resp. rate 18, height 5' 2.5" (1.588 m), weight 96.2 kg (212 lb), SpO2 97 %. Incision/Wound: Incision is clean and dry.  Motor function in the upper extremities reveals that she has weakness in the deltoid and the pectoralis and the grip on the right side at 4+ out of 5 wrist extensor strength in finger extensor strength appears intact  Disposition: 01-Home or Self Care  Discharge Instructions    Call MD for:  redness, tenderness, or signs of infection  (pain, swelling, redness, odor or green/yellow discharge around incision site)    Complete by:  As directed   Call MD for:  severe uncontrolled pain    Complete by:  As directed   Call MD for:  temperature >100.4    Complete by:  As directed   Diet - low sodium heart healthy    Complete by:  As directed   Discharge instructions    Complete by:  As directed   Okay to shower. Do not apply salves or appointments to incision. No heavy lifting with the upper extremities greater than 15 pounds. May resume driving when not requiring pain medication and patient feels comfortable with doing so.   Increase activity slowly    Complete by:  As directed       Medication List    TAKE these medications   aspirin EC 81 MG tablet Take 81 mg by mouth at bedtime. Melanoma prevention   clotrimazole 1 % cream Commonly known as:  LOTRIMIN Apply 1 application topically 2 (two) times daily. What changed:  when to take this  reasons to take this   dexamethasone 1 MG tablet Commonly known as:  DECADRON 2 tablets twice daily for 2 days, one tablet twice daily for 2 days, one tablet daily for 2 days.   furosemide 20 MG tablet Commonly known as:  LASIX Take 20 mg by mouth every other day.   KLOR-CON M10 10 MEQ tablet Generic drug:  potassium chloride TAKE 2 TABLETS BY MOUTH DAILY. What changed:  See the new instructions.   losartan-hydrochlorothiazide 50-12.5 MG tablet Commonly known as:  HYZAAR TAKE 1 TABLET BY MOUTH DAILY.   methocarbamol  500 MG tablet Commonly known as:  ROBAXIN Take 1 tablet (500 mg total) by mouth 4 (four) times daily.   montelukast 10 MG tablet Commonly known as:  SINGULAIR Take 10 mg by mouth at bedtime.   multivitamin with minerals Tabs tablet Take 1 tablet by mouth daily.   nystatin cream Commonly known as:  MYCOSTATIN Apply 1 application topically 2 (two) times daily.   PRESCRIPTION MEDICATION CPAP   PROAIR HFA 108 (90 Base) MCG/ACT inhaler Generic drug:   albuterol INHALE 2 PUFFS INTO LUNGS EVERY 6 HOURS AS NEEDED FOR WHEEZING OR SHORTNESS OF BREATH   PROBIOTIC DAILY PO Take 1 tablet by mouth daily.   TEARS PLUS OP Place 1 drop into both eyes daily as needed (dry eyes/ redness/ burning).   TYLENOL ARTHRITIS PAIN 650 MG CR tablet Generic drug:  acetaminophen Take 1,300 mg by mouth every 8 (eight) hours as needed for pain.   Vitamin D (Ergocalciferol) 50000 units Caps capsule Commonly known as:  DRISDOL TAKE 1 CAPSULE (50,000 UNITS TOTAL) BY MOUTH EVERY 7 (SEVEN) DAYS. What changed:  See the new instructions.      Follow-up [facility] .   Specialty:  [place] Why:  Someone from [facility] will contact you to arrange start date and time for therapy. Contact information: [place] [address] [phone]           Signed: Earleen Newport 07/05/2016, 9:11 AM

## 2016-07-05 NOTE — Progress Notes (Signed)
Occupational Therapy Treatment/Discharge Patient Details Name: [name] MRN: [id number] DOB: 1945/12/14 Today's Date: 07/05/2016    History of present illness 70 yo admitteed for ACDF 4-7 with prior ACDF C3-4 1 year ago. PMHx: arthritis, HTN, DVT, vascular insufficiency, melanoma, HOH   OT comments  Provided pt with HEP for LUE weakness - pt able to complete all exercises seated in chair, but advised her to complete laying in bed if seated becomes too difficult. Pt able to recall all cervical precautions and demonstrated adherence. All education has been completed and pt has no further questions. Pt with no further acute OT needs. OT signing off.   Follow Up Recommendations  Home health OT;Other (comment) (Home health aide/RN)    Equipment Recommendations  3 in 1 bedside comode;Other (comment) (quad cane)    Recommendations for Other Services      Precautions / Restrictions Precautions Precautions: Cervical;Fall Precaution Comments: pt able to recall all cervical precautions  Required Braces or Orthoses: Cervical Brace Cervical Brace: Hard collar;At all times Restrictions Weight Bearing Restrictions: No       Mobility Bed Mobility Overal bed mobility: Needs Assistance Bed Mobility: Rolling;Sidelying to Sit;Sit to Sidelying Rolling: Supervision Sidelying to sit: Supervision       General bed mobility comments: cues for sequence to decrease neck strain, pt plans to sleep on couch with wedge pillow  Transfers Overall transfer level: Modified independent                    Balance Overall balance assessment: Needs assistance Sitting-balance support: No upper extremity supported;Feet supported Sitting balance-Leahy Scale: Good                             ADL                                                Vision                     Perception     Praxis      Cognition   Behavior During Therapy: WFL for tasks  assessed/performed Overall Cognitive Status: Within Functional Limits for tasks assessed                       Extremity/Trunk Assessment               Exercises General Exercises - Upper Extremity Shoulder Flexion: AROM;Left;10 reps;Supine;Seated Shoulder Extension: AROM;Left;10 reps;Supine;Seated Shoulder ABduction: AROM;Left;10 reps;Supine;Seated Shoulder ADduction: AROM;Left;10 reps;Supine;Seated Elbow Flexion: AROM;Left;10 reps;Supine;Seated Elbow Extension: AROM;Left;10 reps;Seated;Supine Other Exercises Other Exercises: Shoulder elevation/depression; AROMl; bilateral; 10 reps; seated; supine Other Exercises: Shoulder protraction/retraction; bilateral; 10 reps; seated; supine   Shoulder Instructions       General Comments      Pertinent Vitals/ Pain       Pain Assessment: 0-10 Pain Score: 3  Pain Location: L shoulder with movement Pain Descriptors / Indicators: Aching Pain Intervention(s): Limited activity within patient's tolerance;Monitored during session;Repositioned  Home Living                                          Prior Functioning/Environment  Frequency       Progress Toward Goals  OT Goals(current goals can now be found in the care plan section)  Progress towards OT goals: Goals met/education completed, patient discharged from OT  Acute Rehab OT Goals Patient Stated Goal: return to crafts and my cats ( does not want SNF because 2 of cats are very sick and fearful they will pass if she does not return home) OT Goal Formulation: With patient Time For Goal Achievement: 07/17/16 Potential to Achieve Goals: Good ADL Goals Pt/caregiver will Perform Home Exercise Program: Left upper extremity;With Supervision;With written HEP provided Additional ADL Goal #1: Pt will complete grooming task mod I at sink level  Plan All goals met and education completed, patient discharged from OT services     Co-evaluation                 End of Session Equipment Utilized During Treatment: Cervical collar   Activity Tolerance Patient tolerated treatment well   Patient Left in chair;with call bell/phone within reach   Nurse Communication Mobility status        Time: 3244-0102 OT Time Calculation (min): 19 min  Charges: OT General Charges $OT Visit: 1 Procedure OT Treatments $Therapeutic Exercise: 8-22 mins  Redmond Baseman, OTR/L Pager: [phone] 07/05/2016, 1:02 PM

## 2016-07-05 NOTE — Progress Notes (Signed)
Physical Therapy Treatment Patient Details Name: [name] [name] DOB: [birth year] Today's Date: 2016/07/16    History of Present Illness 69 yo admitteed for ACDF 4-7 with prior ACDF C3-4 1 year ago. PMHx: arthritis, HTN, DVT, vascular insufficiency, melanoma, HOH    PT Comments    Pt with greatly improved mobility and function from evaluation. Pt with increased LUE strength but maintains deltoid weakness of 2+/5. Pt educated for all precautions, transfers and mobility. Improved gait and endurance with use of quad cane.   Follow Up Recommendations  Home health PT     Equipment Recommendations       Recommendations for Other Services       Precautions / Restrictions Precautions Precautions: Cervical;Fall Required Braces or Orthoses: Cervical Brace Cervical Brace: Hard collar;At all times    Mobility  Bed Mobility Overal bed mobility: Needs Assistance Bed Mobility: Rolling;Sidelying to Sit;Sit to Sidelying Rolling: Supervision Sidelying to sit: Supervision       General bed mobility comments: cues for sequence to decrease neck strain, pt plans to sleep on couch with wedge pillow  Transfers Overall transfer level: Modified independent                  Ambulation/Gait Ambulation/Gait assistance: Modified independent (Device/Increase time) Ambulation Distance (Feet): 300 Feet Assistive device: Quad cane Gait Pattern/deviations: Step-through pattern   Gait velocity interpretation: Below normal speed for age/gender General Gait Details: pt with good use of quad cane and stable gait   Stairs            Wheelchair Mobility    Modified Rankin (Stroke Patients Only)       Balance                                    Cognition Arousal/Alertness: Awake/alert Behavior During Therapy: WFL for tasks assessed/performed Overall Cognitive Status: Within Functional Limits for tasks assessed                      Exercises       General Comments        Pertinent Vitals/Pain Pain Assessment: No/denies pain    Home Living                      Prior Function            PT Goals (current goals can now be found in the care plan section) Progress towards PT goals: Progressing toward goals    Frequency       PT Plan Current plan remains appropriate    Co-evaluation             End of Session Equipment Utilized During Treatment: Cervical collar Activity Tolerance: Patient tolerated treatment well Patient left: in chair;with call bell/phone within reach     Time: 0825-0848 PT Time Calculation (min) (ACUTE ONLY): 23 min  Charges:  $Gait Training: 8-22 mins $Therapeutic Activity: 8-22 mins                    G Codes:      Melford Aase Jul 16, 2016, 9:12 AM Elwyn Reach, [place]

## 2016-07-06 DIAGNOSIS — M4713 Other spondylosis with myelopathy, cervicothoracic region: Secondary | ICD-10-CM | POA: Diagnosis not present

## 2016-07-06 DIAGNOSIS — I10 Essential (primary) hypertension: Secondary | ICD-10-CM | POA: Diagnosis not present

## 2016-07-06 DIAGNOSIS — Z4789 Encounter for other orthopedic aftercare: Secondary | ICD-10-CM | POA: Diagnosis not present

## 2016-07-06 DIAGNOSIS — M1991 Primary osteoarthritis, unspecified site: Secondary | ICD-10-CM | POA: Diagnosis not present

## 2016-07-06 DIAGNOSIS — M40203 Unspecified kyphosis, cervicothoracic region: Secondary | ICD-10-CM | POA: Diagnosis not present

## 2016-07-06 DIAGNOSIS — M419 Scoliosis, unspecified: Secondary | ICD-10-CM | POA: Diagnosis not present

## 2016-07-10 DIAGNOSIS — Z4789 Encounter for other orthopedic aftercare: Secondary | ICD-10-CM | POA: Diagnosis not present

## 2016-07-10 DIAGNOSIS — M40203 Unspecified kyphosis, cervicothoracic region: Secondary | ICD-10-CM | POA: Diagnosis not present

## 2016-07-10 DIAGNOSIS — M1991 Primary osteoarthritis, unspecified site: Secondary | ICD-10-CM | POA: Diagnosis not present

## 2016-07-10 DIAGNOSIS — M4713 Other spondylosis with myelopathy, cervicothoracic region: Secondary | ICD-10-CM | POA: Diagnosis not present

## 2016-07-10 DIAGNOSIS — M419 Scoliosis, unspecified: Secondary | ICD-10-CM | POA: Diagnosis not present

## 2016-07-10 DIAGNOSIS — I10 Essential (primary) hypertension: Secondary | ICD-10-CM | POA: Diagnosis not present

## 2016-07-13 DIAGNOSIS — M419 Scoliosis, unspecified: Secondary | ICD-10-CM | POA: Diagnosis not present

## 2016-07-13 DIAGNOSIS — I10 Essential (primary) hypertension: Secondary | ICD-10-CM | POA: Diagnosis not present

## 2016-07-13 DIAGNOSIS — M4713 Other spondylosis with myelopathy, cervicothoracic region: Secondary | ICD-10-CM | POA: Diagnosis not present

## 2016-07-13 DIAGNOSIS — Z4789 Encounter for other orthopedic aftercare: Secondary | ICD-10-CM | POA: Diagnosis not present

## 2016-07-13 DIAGNOSIS — M40203 Unspecified kyphosis, cervicothoracic region: Secondary | ICD-10-CM | POA: Diagnosis not present

## 2016-07-13 DIAGNOSIS — M1991 Primary osteoarthritis, unspecified site: Secondary | ICD-10-CM | POA: Diagnosis not present

## 2016-07-14 DIAGNOSIS — M40203 Unspecified kyphosis, cervicothoracic region: Secondary | ICD-10-CM | POA: Diagnosis not present

## 2016-07-14 DIAGNOSIS — M419 Scoliosis, unspecified: Secondary | ICD-10-CM | POA: Diagnosis not present

## 2016-07-14 DIAGNOSIS — M1991 Primary osteoarthritis, unspecified site: Secondary | ICD-10-CM | POA: Diagnosis not present

## 2016-07-14 DIAGNOSIS — I10 Essential (primary) hypertension: Secondary | ICD-10-CM | POA: Diagnosis not present

## 2016-07-14 DIAGNOSIS — M4713 Other spondylosis with myelopathy, cervicothoracic region: Secondary | ICD-10-CM | POA: Diagnosis not present

## 2016-07-14 DIAGNOSIS — Z4789 Encounter for other orthopedic aftercare: Secondary | ICD-10-CM | POA: Diagnosis not present

## 2016-07-16 DIAGNOSIS — M40203 Unspecified kyphosis, cervicothoracic region: Secondary | ICD-10-CM | POA: Diagnosis not present

## 2016-07-16 DIAGNOSIS — M4713 Other spondylosis with myelopathy, cervicothoracic region: Secondary | ICD-10-CM | POA: Diagnosis not present

## 2016-07-16 DIAGNOSIS — I10 Essential (primary) hypertension: Secondary | ICD-10-CM | POA: Diagnosis not present

## 2016-07-16 DIAGNOSIS — M419 Scoliosis, unspecified: Secondary | ICD-10-CM | POA: Diagnosis not present

## 2016-07-16 DIAGNOSIS — M1991 Primary osteoarthritis, unspecified site: Secondary | ICD-10-CM | POA: Diagnosis not present

## 2016-07-16 DIAGNOSIS — Z4789 Encounter for other orthopedic aftercare: Secondary | ICD-10-CM | POA: Diagnosis not present

## 2016-07-18 DIAGNOSIS — M40203 Unspecified kyphosis, cervicothoracic region: Secondary | ICD-10-CM | POA: Diagnosis not present

## 2016-07-18 DIAGNOSIS — M4713 Other spondylosis with myelopathy, cervicothoracic region: Secondary | ICD-10-CM | POA: Diagnosis not present

## 2016-07-18 DIAGNOSIS — Z4789 Encounter for other orthopedic aftercare: Secondary | ICD-10-CM | POA: Diagnosis not present

## 2016-07-18 DIAGNOSIS — I10 Essential (primary) hypertension: Secondary | ICD-10-CM | POA: Diagnosis not present

## 2016-07-18 DIAGNOSIS — M1991 Primary osteoarthritis, unspecified site: Secondary | ICD-10-CM | POA: Diagnosis not present

## 2016-07-18 DIAGNOSIS — M419 Scoliosis, unspecified: Secondary | ICD-10-CM | POA: Diagnosis not present

## 2016-07-19 DIAGNOSIS — M4712 Other spondylosis with myelopathy, cervical region: Secondary | ICD-10-CM | POA: Diagnosis not present

## 2016-07-23 ENCOUNTER — Other Ambulatory Visit: Payer: Self-pay | Admitting: Family Medicine

## 2016-07-23 DIAGNOSIS — Z4789 Encounter for other orthopedic aftercare: Secondary | ICD-10-CM | POA: Diagnosis not present

## 2016-07-23 DIAGNOSIS — M1991 Primary osteoarthritis, unspecified site: Secondary | ICD-10-CM | POA: Diagnosis not present

## 2016-07-23 DIAGNOSIS — M419 Scoliosis, unspecified: Secondary | ICD-10-CM | POA: Diagnosis not present

## 2016-07-23 DIAGNOSIS — M40203 Unspecified kyphosis, cervicothoracic region: Secondary | ICD-10-CM | POA: Diagnosis not present

## 2016-07-23 DIAGNOSIS — I10 Essential (primary) hypertension: Secondary | ICD-10-CM | POA: Diagnosis not present

## 2016-07-23 DIAGNOSIS — M4713 Other spondylosis with myelopathy, cervicothoracic region: Secondary | ICD-10-CM | POA: Diagnosis not present

## 2016-07-24 DIAGNOSIS — M1991 Primary osteoarthritis, unspecified site: Secondary | ICD-10-CM | POA: Diagnosis not present

## 2016-07-24 DIAGNOSIS — Z4789 Encounter for other orthopedic aftercare: Secondary | ICD-10-CM | POA: Diagnosis not present

## 2016-07-24 DIAGNOSIS — I10 Essential (primary) hypertension: Secondary | ICD-10-CM | POA: Diagnosis not present

## 2016-07-24 DIAGNOSIS — M40203 Unspecified kyphosis, cervicothoracic region: Secondary | ICD-10-CM | POA: Diagnosis not present

## 2016-07-24 DIAGNOSIS — M419 Scoliosis, unspecified: Secondary | ICD-10-CM | POA: Diagnosis not present

## 2016-07-24 DIAGNOSIS — M4713 Other spondylosis with myelopathy, cervicothoracic region: Secondary | ICD-10-CM | POA: Diagnosis not present

## 2016-07-25 DIAGNOSIS — M1991 Primary osteoarthritis, unspecified site: Secondary | ICD-10-CM | POA: Diagnosis not present

## 2016-07-25 DIAGNOSIS — M40203 Unspecified kyphosis, cervicothoracic region: Secondary | ICD-10-CM | POA: Diagnosis not present

## 2016-07-25 DIAGNOSIS — M419 Scoliosis, unspecified: Secondary | ICD-10-CM | POA: Diagnosis not present

## 2016-07-25 DIAGNOSIS — I10 Essential (primary) hypertension: Secondary | ICD-10-CM | POA: Diagnosis not present

## 2016-07-25 DIAGNOSIS — Z4789 Encounter for other orthopedic aftercare: Secondary | ICD-10-CM | POA: Diagnosis not present

## 2016-07-25 DIAGNOSIS — M4713 Other spondylosis with myelopathy, cervicothoracic region: Secondary | ICD-10-CM | POA: Diagnosis not present

## 2016-07-26 DIAGNOSIS — H04123 Dry eye syndrome of bilateral lacrimal glands: Secondary | ICD-10-CM | POA: Diagnosis not present

## 2016-07-30 DIAGNOSIS — I10 Essential (primary) hypertension: Secondary | ICD-10-CM | POA: Diagnosis not present

## 2016-07-30 DIAGNOSIS — M4713 Other spondylosis with myelopathy, cervicothoracic region: Secondary | ICD-10-CM | POA: Diagnosis not present

## 2016-07-30 DIAGNOSIS — M419 Scoliosis, unspecified: Secondary | ICD-10-CM | POA: Diagnosis not present

## 2016-07-30 DIAGNOSIS — Z4789 Encounter for other orthopedic aftercare: Secondary | ICD-10-CM | POA: Diagnosis not present

## 2016-07-30 DIAGNOSIS — M40203 Unspecified kyphosis, cervicothoracic region: Secondary | ICD-10-CM | POA: Diagnosis not present

## 2016-07-30 DIAGNOSIS — M1991 Primary osteoarthritis, unspecified site: Secondary | ICD-10-CM | POA: Diagnosis not present

## 2016-08-01 DIAGNOSIS — Z4789 Encounter for other orthopedic aftercare: Secondary | ICD-10-CM | POA: Diagnosis not present

## 2016-08-01 DIAGNOSIS — M4713 Other spondylosis with myelopathy, cervicothoracic region: Secondary | ICD-10-CM | POA: Diagnosis not present

## 2016-08-01 DIAGNOSIS — M40203 Unspecified kyphosis, cervicothoracic region: Secondary | ICD-10-CM | POA: Diagnosis not present

## 2016-08-01 DIAGNOSIS — I10 Essential (primary) hypertension: Secondary | ICD-10-CM | POA: Diagnosis not present

## 2016-08-01 DIAGNOSIS — M419 Scoliosis, unspecified: Secondary | ICD-10-CM | POA: Diagnosis not present

## 2016-08-01 DIAGNOSIS — M1991 Primary osteoarthritis, unspecified site: Secondary | ICD-10-CM | POA: Diagnosis not present

## 2016-08-06 DIAGNOSIS — M40203 Unspecified kyphosis, cervicothoracic region: Secondary | ICD-10-CM | POA: Diagnosis not present

## 2016-08-06 DIAGNOSIS — M419 Scoliosis, unspecified: Secondary | ICD-10-CM | POA: Diagnosis not present

## 2016-08-06 DIAGNOSIS — Z4789 Encounter for other orthopedic aftercare: Secondary | ICD-10-CM | POA: Diagnosis not present

## 2016-08-06 DIAGNOSIS — I10 Essential (primary) hypertension: Secondary | ICD-10-CM | POA: Diagnosis not present

## 2016-08-06 DIAGNOSIS — M1991 Primary osteoarthritis, unspecified site: Secondary | ICD-10-CM | POA: Diagnosis not present

## 2016-08-06 DIAGNOSIS — M4713 Other spondylosis with myelopathy, cervicothoracic region: Secondary | ICD-10-CM | POA: Diagnosis not present

## 2016-08-08 DIAGNOSIS — Z4789 Encounter for other orthopedic aftercare: Secondary | ICD-10-CM | POA: Diagnosis not present

## 2016-08-08 DIAGNOSIS — M4713 Other spondylosis with myelopathy, cervicothoracic region: Secondary | ICD-10-CM | POA: Diagnosis not present

## 2016-08-08 DIAGNOSIS — M40203 Unspecified kyphosis, cervicothoracic region: Secondary | ICD-10-CM | POA: Diagnosis not present

## 2016-08-08 DIAGNOSIS — M1991 Primary osteoarthritis, unspecified site: Secondary | ICD-10-CM | POA: Diagnosis not present

## 2016-08-08 DIAGNOSIS — M419 Scoliosis, unspecified: Secondary | ICD-10-CM | POA: Diagnosis not present

## 2016-08-08 DIAGNOSIS — I10 Essential (primary) hypertension: Secondary | ICD-10-CM | POA: Diagnosis not present

## 2016-08-09 DIAGNOSIS — M4712 Other spondylosis with myelopathy, cervical region: Secondary | ICD-10-CM | POA: Diagnosis not present

## 2016-08-13 ENCOUNTER — Ambulatory Visit: Payer: Medicare Other | Admitting: Podiatry

## 2016-08-13 ENCOUNTER — Ambulatory Visit (INDEPENDENT_AMBULATORY_CARE_PROVIDER_SITE_OTHER): Payer: Medicare Other | Admitting: Family Medicine

## 2016-08-13 ENCOUNTER — Encounter: Payer: Self-pay | Admitting: Family Medicine

## 2016-08-13 VITALS — BP 126/76 | HR 72 | Temp 98.0°F | Wt 219.5 lb

## 2016-08-13 DIAGNOSIS — R600 Localized edema: Secondary | ICD-10-CM | POA: Diagnosis not present

## 2016-08-13 DIAGNOSIS — M419 Scoliosis, unspecified: Secondary | ICD-10-CM | POA: Diagnosis not present

## 2016-08-13 DIAGNOSIS — M1991 Primary osteoarthritis, unspecified site: Secondary | ICD-10-CM | POA: Diagnosis not present

## 2016-08-13 DIAGNOSIS — Z23 Encounter for immunization: Secondary | ICD-10-CM | POA: Diagnosis not present

## 2016-08-13 DIAGNOSIS — Z4789 Encounter for other orthopedic aftercare: Secondary | ICD-10-CM | POA: Diagnosis not present

## 2016-08-13 DIAGNOSIS — I83892 Varicose veins of left lower extremities with other complications: Secondary | ICD-10-CM | POA: Diagnosis not present

## 2016-08-13 DIAGNOSIS — M5412 Radiculopathy, cervical region: Secondary | ICD-10-CM

## 2016-08-13 DIAGNOSIS — I10 Essential (primary) hypertension: Secondary | ICD-10-CM | POA: Diagnosis not present

## 2016-08-13 DIAGNOSIS — I872 Venous insufficiency (chronic) (peripheral): Secondary | ICD-10-CM | POA: Diagnosis not present

## 2016-08-13 DIAGNOSIS — M4713 Other spondylosis with myelopathy, cervicothoracic region: Secondary | ICD-10-CM | POA: Diagnosis not present

## 2016-08-13 DIAGNOSIS — M4712 Other spondylosis with myelopathy, cervical region: Secondary | ICD-10-CM

## 2016-08-13 DIAGNOSIS — G902 Horner's syndrome: Secondary | ICD-10-CM | POA: Diagnosis not present

## 2016-08-13 DIAGNOSIS — G959 Disease of spinal cord, unspecified: Secondary | ICD-10-CM

## 2016-08-13 DIAGNOSIS — M40203 Unspecified kyphosis, cervicothoracic region: Secondary | ICD-10-CM | POA: Diagnosis not present

## 2016-08-13 LAB — CBC WITH DIFFERENTIAL/PLATELET
Basophils Absolute: 0 10*3/uL (ref 0.0–0.1)
Basophils Relative: 0.2 % (ref 0.0–3.0)
Eosinophils Absolute: 0.3 10*3/uL (ref 0.0–0.7)
Eosinophils Relative: 4.8 % (ref 0.0–5.0)
HCT: 37.8 % (ref 36.0–46.0)
Hemoglobin: 12.7 g/dL (ref 12.0–15.0)
Lymphocytes Relative: 19.4 % (ref 12.0–46.0)
Lymphs Abs: 1.2 10*3/uL (ref 0.7–4.0)
MCHC: 33.5 g/dL (ref 30.0–36.0)
MCV: 91.3 fl (ref 78.0–100.0)
Monocytes Absolute: 0.4 10*3/uL (ref 0.1–1.0)
Monocytes Relative: 7 % (ref 3.0–12.0)
Neutro Abs: 4.4 10*3/uL (ref 1.4–7.7)
Neutrophils Relative %: 68.6 % (ref 43.0–77.0)
Platelets: 214 10*3/uL (ref 150.0–400.0)
RBC: 4.14 Mil/uL (ref 3.87–5.11)
RDW: 13.1 % (ref 11.5–15.5)
WBC: 6.4 10*3/uL (ref 4.0–10.5)

## 2016-08-13 LAB — MICROALBUMIN / CREATININE URINE RATIO
Creatinine,U: 48.6 mg/dL
Microalb Creat Ratio: 1.4 mg/g (ref 0.0–30.0)
Microalb, Ur: <0.7 mg/dL (ref 0.0–1.9)

## 2016-08-13 LAB — BRAIN NATRIURETIC PEPTIDE: Pro B Natriuretic peptide (BNP): 15 pg/mL (ref 0.0–100.0)

## 2016-08-13 LAB — COMPREHENSIVE METABOLIC PANEL
ALT: 17 U/L (ref 0–35)
AST: 11 U/L (ref 0–37)
Albumin: 3.6 g/dL (ref 3.5–5.2)
Alkaline Phosphatase: 57 U/L (ref 39–117)
BUN: 16 mg/dL (ref 6–23)
CO2: 32 mEq/L (ref 19–32)
Calcium: 9.5 mg/dL (ref 8.4–10.5)
Chloride: 106 mEq/L (ref 96–112)
Creatinine, Ser: 0.71 mg/dL (ref 0.40–1.20)
GFR: 86.47 mL/min (ref >60.00)
Glucose, Bld: 100 mg/dL — ABNORMAL HIGH (ref 70–99)
Potassium: 4 mEq/L (ref 3.5–5.1)
Sodium: 142 mEq/L (ref 135–145)
Total Bilirubin: 0.4 mg/dL (ref 0.2–1.2)
Total Protein: 6.6 g/dL (ref 6.0–8.3)

## 2016-08-13 LAB — TSH: TSH: 2.72 u[IU]/mL (ref 0.35–4.50)

## 2016-08-13 MED ORDER — FUROSEMIDE 40 MG PO TABS: 40.0000 mg | ORAL_TABLET | Freq: Every day | ORAL | 3 refills | Status: DC

## 2016-08-13 NOTE — Assessment & Plan Note (Signed)
Continue to monitor

## 2016-08-13 NOTE — Patient Instructions (Signed)
Flu shot today (high dose) Increase water Increase lasix to 40mg  daily.  Lab work today.  Update me with higher lasix dose effect in 2-3 days.

## 2016-08-13 NOTE — Progress Notes (Signed)
Pre visit review using our clinic review tool, if applicable. No additional management support is needed unless otherwise documented below in the visit note. 

## 2016-08-13 NOTE — Addendum Note (Signed)
Addended by: Royann Shivers A on: 08/13/2016 09:24 AM   Modules accepted: Orders

## 2016-08-13 NOTE — Progress Notes (Addendum)
BP 126/76   Pulse 72   Temp 98 F (36.7 C) (Oral)   Wt 219 lb 8 oz (99.6 kg)   BMI 39.51 kg/m    CC: leg swelling Subjective:    Patient ID: [name], female    DOB: 1946/10/05, 70 y.o.   [name]  HPI: CHRISTYNA FENDERSON is a 70 y.o. female presenting on 08/13/2016 for Edema (bilateral anklles and legs)   6 wks out from C4-7 decompression and fusion by Dr Ellene Route. Eye doctor thought this may be dry eyes. Possible horner syndrome sequela of recent neck surgery. Discussing possible further posterior neck surgery to further support neck. Pt and Dr Ellene Route want to defer this if at all possible. Working with occupational therapist.   Here today for progressive L>R ankle swelling. 7lb weight gain noted as well. Known chronic venous insufficiency s/p L endovenous ablation x2 Kellie Simmering). Currently on lasix 20mg  QOD and hyzaar 50/12.5mg  daily. Has increased lasix to 20mg  QD for past 3 weeks. Noticed worsening L ankle swelling as well as burning pain across band like area of lower leg. Denies chest pain, dyspnea or lightheadedness. Denies increased salt in diet. Not currently using compression stockings - due to bilateral ankle pain.   No results found for: VITAMINB12   Relevant past medical, surgical, family and social history reviewed and updated as indicated. Interim medical history since our last visit reviewed. Allergies and medications reviewed and updated. Current Outpatient Prescriptions on File Prior to Visit  Medication Sig  . acetaminophen (TYLENOL ARTHRITIS PAIN) 650 MG CR tablet Take 1,300 mg by mouth every 8 (eight) hours as needed for pain.   Marland Kitchen aspirin EC 81 MG tablet Take 81 mg by mouth at bedtime. Melanoma prevention  . KLOR-CON 10 10 MEQ tablet TAKE 2 TABLETS BY MOUTH DAILY.  Marland Kitchen losartan-hydrochlorothiazide (HYZAAR) 50-12.5 MG tablet TAKE 1 TABLET BY MOUTH DAILY.  . montelukast (SINGULAIR) 10 MG tablet Take 10 mg by mouth at bedtime.  . Multiple Vitamin (MULTIVITAMIN WITH  MINERALS) TABS tablet Take 1 tablet by mouth daily.  . Polyvinyl Alcohol-Povidone (TEARS PLUS OP) Place 1 drop into both eyes daily as needed (dry eyes/ redness/ burning).  Marland Kitchen PRESCRIPTION MEDICATION CPAP  . PROAIR HFA 108 (90 Base) MCG/ACT inhaler INHALE 2 PUFFS INTO LUNGS EVERY 6 HOURS AS NEEDED FOR WHEEZING OR SHORTNESS OF BREATH  . Probiotic Product (PROBIOTIC DAILY PO) Take 1 tablet by mouth daily.   . Vitamin D, Ergocalciferol, (DRISDOL) 50000 units CAPS capsule TAKE 1 CAPSULE (50,000 UNITS TOTAL) BY MOUTH EVERY 7 (SEVEN) DAYS. (Patient taking differently: TAKE 1 CAPSULE (50,000 UNITS TOTAL) BY MOUTH EVERY Wednesday)   No current facility-administered medications on file prior to visit.     Review of Systems Per HPI unless specifically indicated in ROS section     Objective:    BP 126/76   Pulse 72   Temp 98 F (36.7 C) (Oral)   Wt 219 lb 8 oz (99.6 kg)   BMI 39.51 kg/m   Wt Readings from Last 3 Encounters:  08/13/16 219 lb 8 oz (99.6 kg)  07/02/16 212 lb (96.2 kg)  06/25/16 212 lb 4.8 oz (96.3 kg)    Physical Exam  Constitutional: She appears well-developed and well-nourished. No distress.  HENT:  Mouth/Throat: Oropharynx is clear and moist. No oropharyngeal exudate.  Eyes: Conjunctivae are normal.  L drooping eyelid, mild L miosis  Cardiovascular: Normal rate, regular rhythm and intact distal pulses.   Murmur (2/6 SEM)  heard. Pulmonary/Chest: Effort normal and breath sounds normal. No respiratory distress. She has no wheezes. She has no rales.  Musculoskeletal: She exhibits edema.  Skin: Skin is warm and dry. No rash noted.  Psychiatric: She has a normal mood and affect.  Nursing note and vitals reviewed.  Lab Results  Component Value Date   TSH 2.16 09/22/2014       Assessment & Plan:  Over 25 minutes were spent face-to-face with the patient during this encounter and >50% of that time was spent on counseling and coordination of care  Problem List Items  Addressed This Visit    Cervical myelopathy with cervical radiculopathy   Chronic venous insufficiency   Relevant Medications   furosemide (LASIX) 40 MG tablet   Other Relevant Orders   TSH   Horner syndrome    Continue to monitor.       Pedal edema - Primary    Acute worsening over last few weeks. Increase lasix to 40mg  daily, update me with effect later this week.  Increase water. Continue to elevate legs. Pt states unable to wear compression stockings at this time 2/2 leg pain from swelling, recent neck surgery causing increased immobility.  Check labs for other causes of leg swelling.      Relevant Orders   D-dimer, quantitative (not at [facility])   Brain natriuretic peptide   Comprehensive metabolic panel   CBC with Differential/Platelet   TSH   Microalbumin / creatinine urine ratio   Varicose veins of left lower extremity with complications   Relevant Medications   furosemide (LASIX) 40 MG tablet    Other Visit Diagnoses   None.      Follow up plan: No Follow-up on file.  [name], MD

## 2016-08-13 NOTE — Assessment & Plan Note (Signed)
Acute worsening over last few weeks. Increase lasix to 40mg  daily, update me with effect later this week.  Increase water. Continue to elevate legs. Pt states unable to wear compression stockings at this time 2/2 leg pain from swelling, recent neck surgery causing increased immobility.  Check labs for other causes of leg swelling.

## 2016-08-13 NOTE — Assessment & Plan Note (Signed)
Appreciate neurosurgery care.  

## 2016-08-14 LAB — D-DIMER, QUANTITATIVE: D-Dimer, Quant: 0.44 mcg/mL FEU (ref <0.50)

## 2016-08-15 DIAGNOSIS — I10 Essential (primary) hypertension: Secondary | ICD-10-CM | POA: Diagnosis not present

## 2016-08-15 DIAGNOSIS — M1991 Primary osteoarthritis, unspecified site: Secondary | ICD-10-CM | POA: Diagnosis not present

## 2016-08-15 DIAGNOSIS — Z4789 Encounter for other orthopedic aftercare: Secondary | ICD-10-CM | POA: Diagnosis not present

## 2016-08-15 DIAGNOSIS — M419 Scoliosis, unspecified: Secondary | ICD-10-CM | POA: Diagnosis not present

## 2016-08-15 DIAGNOSIS — M4713 Other spondylosis with myelopathy, cervicothoracic region: Secondary | ICD-10-CM | POA: Diagnosis not present

## 2016-08-15 DIAGNOSIS — M40203 Unspecified kyphosis, cervicothoracic region: Secondary | ICD-10-CM | POA: Diagnosis not present

## 2016-08-16 ENCOUNTER — Telehealth: Payer: Self-pay

## 2016-08-16 NOTE — Telephone Encounter (Signed)
Noted. Thanks.

## 2016-08-16 NOTE — Telephone Encounter (Signed)
Pt left vm; pt was seen 08/13/16 and has been taking Lasix 40 mg daily. The swelling is starting to go down; pt can see a change in the swelling. Pt would prefer if it were a quicker process. Unable to reach pt for more info.

## 2016-08-20 DIAGNOSIS — Z4789 Encounter for other orthopedic aftercare: Secondary | ICD-10-CM | POA: Diagnosis not present

## 2016-08-20 DIAGNOSIS — M4713 Other spondylosis with myelopathy, cervicothoracic region: Secondary | ICD-10-CM | POA: Diagnosis not present

## 2016-08-20 DIAGNOSIS — M419 Scoliosis, unspecified: Secondary | ICD-10-CM | POA: Diagnosis not present

## 2016-08-20 DIAGNOSIS — M1991 Primary osteoarthritis, unspecified site: Secondary | ICD-10-CM | POA: Diagnosis not present

## 2016-08-20 DIAGNOSIS — I10 Essential (primary) hypertension: Secondary | ICD-10-CM | POA: Diagnosis not present

## 2016-08-20 DIAGNOSIS — M40203 Unspecified kyphosis, cervicothoracic region: Secondary | ICD-10-CM | POA: Diagnosis not present

## 2016-08-21 NOTE — Telephone Encounter (Signed)
Best number [phone] Pt called stating that right leg swelling has gone down but not all  the way and left leg is still the same Pt has been taking her rx has prescribed since last Monday 10/9 Pt would like a call back

## 2016-08-22 ENCOUNTER — Ambulatory Visit: Payer: Medicare Other | Admitting: Podiatry

## 2016-08-22 DIAGNOSIS — Z4789 Encounter for other orthopedic aftercare: Secondary | ICD-10-CM | POA: Diagnosis not present

## 2016-08-22 DIAGNOSIS — I10 Essential (primary) hypertension: Secondary | ICD-10-CM | POA: Diagnosis not present

## 2016-08-22 DIAGNOSIS — M419 Scoliosis, unspecified: Secondary | ICD-10-CM | POA: Diagnosis not present

## 2016-08-22 DIAGNOSIS — M4713 Other spondylosis with myelopathy, cervicothoracic region: Secondary | ICD-10-CM | POA: Diagnosis not present

## 2016-08-22 DIAGNOSIS — M1991 Primary osteoarthritis, unspecified site: Secondary | ICD-10-CM | POA: Diagnosis not present

## 2016-08-22 DIAGNOSIS — M40203 Unspecified kyphosis, cervicothoracic region: Secondary | ICD-10-CM | POA: Diagnosis not present

## 2016-08-22 NOTE — Telephone Encounter (Signed)
Discussed with patient. Very slow improvement of left leg swelling.  Recent Ddimer reassuringly normal. Discussed given lasix more time - if no improvement into next week, would suggest venous US to r/o clot or other cause of prolonged unilateral swelling. Pt agrees with plan.

## 2016-08-27 DIAGNOSIS — Z4789 Encounter for other orthopedic aftercare: Secondary | ICD-10-CM | POA: Diagnosis not present

## 2016-08-27 DIAGNOSIS — M4713 Other spondylosis with myelopathy, cervicothoracic region: Secondary | ICD-10-CM | POA: Diagnosis not present

## 2016-08-27 DIAGNOSIS — M419 Scoliosis, unspecified: Secondary | ICD-10-CM | POA: Diagnosis not present

## 2016-08-27 DIAGNOSIS — I10 Essential (primary) hypertension: Secondary | ICD-10-CM | POA: Diagnosis not present

## 2016-08-27 DIAGNOSIS — M40203 Unspecified kyphosis, cervicothoracic region: Secondary | ICD-10-CM | POA: Diagnosis not present

## 2016-08-27 DIAGNOSIS — M1991 Primary osteoarthritis, unspecified site: Secondary | ICD-10-CM | POA: Diagnosis not present

## 2016-08-28 ENCOUNTER — Encounter: Payer: Self-pay | Admitting: Family Medicine

## 2016-08-28 ENCOUNTER — Ambulatory Visit (HOSPITAL_COMMUNITY)
Admission: RE | Admit: 2016-08-28 | Discharge: 2016-08-28 | Disposition: A | Discharge status: Home / Self Care | Payer: Medicare Other | Source: Ambulatory Visit | Attending: Vascular Surgery | Admitting: Vascular Surgery

## 2016-08-28 ENCOUNTER — Telehealth: Payer: Self-pay

## 2016-08-28 ENCOUNTER — Ambulatory Visit (INDEPENDENT_AMBULATORY_CARE_PROVIDER_SITE_OTHER): Payer: Medicare Other | Admitting: Family Medicine

## 2016-08-28 VITALS — BP 128/72 | HR 80 | Temp 97.7°F | Wt 221.5 lb

## 2016-08-28 DIAGNOSIS — M79604 Pain in right leg: Secondary | ICD-10-CM | POA: Insufficient documentation

## 2016-08-28 DIAGNOSIS — I83892 Varicose veins of left lower extremities with other complications: Secondary | ICD-10-CM | POA: Diagnosis not present

## 2016-08-28 DIAGNOSIS — M79605 Pain in left leg: Secondary | ICD-10-CM

## 2016-08-28 DIAGNOSIS — I890 Lymphedema, not elsewhere classified: Secondary | ICD-10-CM

## 2016-08-28 DIAGNOSIS — I872 Venous insufficiency (chronic) (peripheral): Secondary | ICD-10-CM

## 2016-08-28 DIAGNOSIS — R600 Localized edema: Secondary | ICD-10-CM | POA: Diagnosis not present

## 2016-08-28 MED ORDER — GABAPENTIN 300 MG PO CAPS: 300.0000 mg | ORAL_CAPSULE | Freq: Every day | ORAL | 3 refills | Status: DC

## 2016-08-28 NOTE — Progress Notes (Signed)
BP 128/72   Pulse 80   Temp 97.7 F (36.5 C) (Oral)   Wt 221 lb 8 oz (100.5 kg)   BMI 39.87 kg/m    CC: f/u pedal edema Subjective:    Patient ID: [name], female    DOB: 07-30-1946, 70 y.o.   MRN: [id number]  HPI: [name] is a 70 y.o. female presenting on 08/28/2016 for Leg Swelling (hurts worse when standing-feels better when walking)   See previous OV and phone note.   Progressive L>R ankle swelling. Lab work unrevealing. Known chronic venous insufficiency s/p L endovenous ablation x2 Kellie Simmering). Currently on lasix 40 mg daily. Noticed worsening L ankle swelling as well as burning pain across band like area. Worse pain at night time when trying to sleep - wakes her up at night.   Denies chest pain, dyspnea or lightheadedness. Denies increased salt in diet. Not currently using compression stockings - due to bilateral ankle pain and difficulty placing stockings on due to cervical neck surgeries. Previously on gabapentin.   Sister with recent dx lymphedema. Mother with h/o bad legs as well.   ABI WNL 2016.   Relevant past medical, surgical, family and social history reviewed and updated as indicated. Interim medical history since our last visit reviewed. Allergies and medications reviewed and updated. Current Outpatient Prescriptions on File Prior to Visit  Medication Sig  . acetaminophen (TYLENOL ARTHRITIS PAIN) 650 MG CR tablet Take 1,300 mg by mouth every 8 (eight) hours as needed for pain.   Marland Kitchen aspirin EC 81 MG tablet Take 81 mg by mouth at bedtime. Melanoma prevention  . furosemide (LASIX) 40 MG tablet Take 1 tablet (40 mg total) by mouth daily.  Marland Kitchen KLOR-CON 10 10 MEQ tablet TAKE 2 TABLETS BY MOUTH DAILY.  Marland Kitchen losartan-hydrochlorothiazide (HYZAAR) 50-12.5 MG tablet TAKE 1 TABLET BY MOUTH DAILY.  . montelukast (SINGULAIR) 10 MG tablet Take 10 mg by mouth at bedtime.  . Multiple Vitamin (MULTIVITAMIN WITH MINERALS) TABS tablet Take 1 tablet by mouth daily.  .  Polyvinyl Alcohol-Povidone (TEARS PLUS OP) Place 1 drop into both eyes daily as needed (dry eyes/ redness/ burning).  Marland Kitchen PRESCRIPTION MEDICATION CPAP  . PROAIR HFA 108 (90 Base) MCG/ACT inhaler INHALE 2 PUFFS INTO LUNGS EVERY 6 HOURS AS NEEDED FOR WHEEZING OR SHORTNESS OF BREATH  . Probiotic Product (PROBIOTIC DAILY PO) Take 1 tablet by mouth daily.   . Vitamin D, Ergocalciferol, (DRISDOL) 50000 units CAPS capsule TAKE 1 CAPSULE (50,000 UNITS TOTAL) BY MOUTH EVERY 7 (SEVEN) DAYS. (Patient taking differently: TAKE 1 CAPSULE (50,000 UNITS TOTAL) BY MOUTH EVERY Wednesday)   No current facility-administered medications on file prior to visit.     Review of Systems Per HPI unless specifically indicated in ROS section     Objective:    BP 128/72   Pulse 80   Temp 97.7 F (36.5 C) (Oral)   Wt 221 lb 8 oz (100.5 kg)   BMI 39.87 kg/m   Wt Readings from Last 3 Encounters:  08/28/16 221 lb 8 oz (100.5 kg)  08/13/16 219 lb 8 oz (99.6 kg)  07/02/16 212 lb (96.2 kg)    Physical Exam  Constitutional: She appears well-developed and well-nourished. No distress.  Musculoskeletal: She exhibits edema (1+ tender pitting pedal edema BLE).  Tender BLE induration present without erythema  Skin: Skin is warm and dry. No rash noted. No erythema.  Slight hemosiderin deposition  Psychiatric: She has a normal mood and affect.  Nursing  note and vitals reviewed.  Results for orders placed or performed in visit on 08/13/16  D-dimer, quantitative (not at [facility])  Result Value Ref Range   D-Dimer, Quant 0.44 <0.50 mcg/mL FEU  Brain natriuretic peptide  Result Value Ref Range   Pro B Natriuretic peptide (BNP) 15.0 0.0 - 100.0 pg/mL  Comprehensive metabolic panel  Result Value Ref Range   Sodium 142 135 - 145 mEq/L   Potassium 4.0 3.5 - 5.1 mEq/L   Chloride 106 96 - 112 mEq/L   CO2 32 19 - 32 mEq/L   Glucose, Bld 100 (H) 70 - 99 mg/dL   BUN 16 6 - 23 mg/dL   Creatinine, Ser 0.71 0.40 - 1.20 mg/dL    Total Bilirubin 0.4 0.2 - 1.2 mg/dL   Alkaline Phosphatase 57 39 - 117 U/L   AST 11 0 - 37 U/L   ALT 17 0 - 35 U/L   Total Protein 6.6 6.0 - 8.3 g/dL   Albumin 3.6 3.5 - 5.2 g/dL   Calcium 9.5 8.4 - 10.5 mg/dL   GFR 86.47 >60.00 mL/min  CBC with Differential/Platelet  Result Value Ref Range   WBC 6.4 4.0 - 10.5 K/uL   RBC 4.14 3.87 - 5.11 Mil/uL   Hemoglobin 12.7 12.0 - 15.0 g/dL   HCT 37.8 36.0 - 46.0 %   MCV 91.3 78.0 - 100.0 fl   MCHC 33.5 30.0 - 36.0 g/dL   RDW 13.1 11.5 - 15.5 %   Platelets 214.0 150.0 - 400.0 K/uL   Neutrophils Relative % 68.6 43.0 - 77.0 %   Lymphocytes Relative 19.4 12.0 - 46.0 %   Monocytes Relative 7.0 3.0 - 12.0 %   Eosinophils Relative 4.8 0.0 - 5.0 %   Basophils Relative 0.2 0.0 - 3.0 %   Neutro Abs 4.4 1.4 - 7.7 K/uL   Lymphs Abs 1.2 0.7 - 4.0 K/uL   Monocytes Absolute 0.4 0.1 - 1.0 K/uL   Eosinophils Absolute 0.3 0.0 - 0.7 K/uL   Basophils Absolute 0.0 0.0 - 0.1 K/uL  TSH  Result Value Ref Range   TSH 2.72 0.35 - 4.50 uIU/mL  Microalbumin / creatinine urine ratio  Result Value Ref Range   Microalb, Ur <0.7 0.0 - 1.9 mg/dL   Creatinine,U 48.6 mg/dL   Microalb Creat Ratio 1.4 0.0 - 30.0 mg/g      Assessment & Plan:   Problem List Items Addressed This Visit    Chronic venous insufficiency    Retrial gabapentin. Persistent pedal edema with chronic venous changes BLE.       Relevant Orders   Ambulatory referral to Physical Therapy   Leg pain, bilateral - Primary    Anticipate CVI related as well as possible neuropathic component so will add gabapentin 300mg  nightly. Will refer to PT to discuss lymphedema treatment in setting of being unable to use compression stockings after recent cervical neck surgery. Although D dimer normal, check dopplers to r/o DVT given persistent pain/swelling.      Relevant Orders   Ambulatory referral to Physical Therapy   VAS [place] LOWER EXTREMITY VENOUS (DVT)   Pedal edema    Ongoing - see above. labwork  unrevealing. Concern for lymphedema.       Varicose veins of left lower extremity with complications   Relevant Orders   VAS [place] LOWER EXTREMITY VENOUS (DVT)    Other Visit Diagnoses    Lymphedema       Relevant Orders   Ambulatory referral to  Physical Therapy       Follow up plan: Return if symptoms worsen or fail to improve.  [name], MD

## 2016-08-28 NOTE — Addendum Note (Signed)
Addended by: [name] on: 08/28/2016 05:09 PM   Modules accepted: Orders

## 2016-08-28 NOTE — Assessment & Plan Note (Addendum)
Anticipate CVI related as well as possible neuropathic component so will add gabapentin 300mg  nightly. Will refer to PT to discuss lymphedema treatment in setting of being unable to use compression stockings after recent cervical neck surgery. Although D dimer normal, check dopplers to r/o DVT given persistent pain/swelling.

## 2016-08-28 NOTE — Telephone Encounter (Signed)
[name] with vascular lab at [facility] left v/m; called report for preliminary bilateral lower extremities venous duplex; negative for DVT. [name] let pt go home and call pt with further instructions.

## 2016-08-28 NOTE — Patient Instructions (Addendum)
Keep legs elevated. Avoid salt, drink plenty of water.  We will refer you to physical therapy for further evaluation of lymphedema.  We will check vein ultrasound to rule out clot. We will start gabapentin 300mg  nightly - watch for dizziness.

## 2016-08-28 NOTE — Progress Notes (Signed)
Pre visit review using our clinic review tool, if applicable. No additional management support is needed unless otherwise documented below in the visit note. 

## 2016-08-28 NOTE — Assessment & Plan Note (Signed)
Retrial gabapentin. Persistent pedal edema with chronic venous changes BLE.

## 2016-08-28 NOTE — Telephone Encounter (Signed)
Noted. plz notify pt DVT negative study. Will proceed with lymphedema physical therapy referral and trial gabapentin as discussed.

## 2016-08-28 NOTE — Assessment & Plan Note (Signed)
Ongoing - see above. labwork unrevealing. Concern for lymphedema.

## 2016-08-29 NOTE — Telephone Encounter (Signed)
Patient notified

## 2016-08-30 DIAGNOSIS — Z4789 Encounter for other orthopedic aftercare: Secondary | ICD-10-CM | POA: Diagnosis not present

## 2016-08-30 DIAGNOSIS — M4713 Other spondylosis with myelopathy, cervicothoracic region: Secondary | ICD-10-CM | POA: Diagnosis not present

## 2016-08-30 DIAGNOSIS — M40203 Unspecified kyphosis, cervicothoracic region: Secondary | ICD-10-CM | POA: Diagnosis not present

## 2016-08-30 DIAGNOSIS — M419 Scoliosis, unspecified: Secondary | ICD-10-CM | POA: Diagnosis not present

## 2016-08-30 DIAGNOSIS — I10 Essential (primary) hypertension: Secondary | ICD-10-CM | POA: Diagnosis not present

## 2016-08-30 DIAGNOSIS — M1991 Primary osteoarthritis, unspecified site: Secondary | ICD-10-CM | POA: Diagnosis not present

## 2016-09-04 DIAGNOSIS — M1991 Primary osteoarthritis, unspecified site: Secondary | ICD-10-CM | POA: Diagnosis not present

## 2016-09-04 DIAGNOSIS — M4713 Other spondylosis with myelopathy, cervicothoracic region: Secondary | ICD-10-CM | POA: Diagnosis not present

## 2016-09-04 DIAGNOSIS — M419 Scoliosis, unspecified: Secondary | ICD-10-CM | POA: Diagnosis not present

## 2016-09-04 DIAGNOSIS — R609 Edema, unspecified: Secondary | ICD-10-CM | POA: Diagnosis not present

## 2016-09-04 DIAGNOSIS — I10 Essential (primary) hypertension: Secondary | ICD-10-CM | POA: Diagnosis not present

## 2016-09-04 DIAGNOSIS — M40203 Unspecified kyphosis, cervicothoracic region: Secondary | ICD-10-CM | POA: Diagnosis not present

## 2016-09-05 ENCOUNTER — Ambulatory Visit: Payer: Medicare Other | Admitting: Occupational Therapy

## 2016-09-06 ENCOUNTER — Ambulatory Visit: Payer: Medicare Other | Admitting: Occupational Therapy

## 2016-09-12 DIAGNOSIS — M4712 Other spondylosis with myelopathy, cervical region: Secondary | ICD-10-CM | POA: Diagnosis not present

## 2016-09-13 ENCOUNTER — Ambulatory Visit: Payer: Medicare Other | Attending: Neurology | Admitting: Occupational Therapy

## 2016-09-13 ENCOUNTER — Encounter: Payer: Self-pay | Admitting: Occupational Therapy

## 2016-09-13 ENCOUNTER — Ambulatory Visit: Payer: Medicare Other | Admitting: Occupational Therapy

## 2016-09-13 VITALS — Ht 63.0 in | Wt 222.0 lb

## 2016-09-13 DIAGNOSIS — I890 Lymphedema, not elsewhere classified: Secondary | ICD-10-CM | POA: Diagnosis not present

## 2016-09-13 NOTE — Therapy (Signed)
[place] MAIN [facility] SERVICES [address] [place], [place], [address] Phone: [phone]   Fax:  [phone]  Occupational Therapy Evaluation  Patient Details  Name: [name] MRN: [id number] Date of Birth: 06/19/1946 No Data Recorded  Encounter Date: 09/13/2016      OT End of Session - 09/13/16 1427    Visit Number 1   Number of Visits 36   Date for OT Re-Evaluation 12/12/16   OT Start Time 0907   OT Stop Time 1015   OT Time Calculation (min) 68 min   Activity Tolerance Patient tolerated treatment well;No increased pain   Behavior During Therapy WFL for tasks assessed/performed      Past Medical History:  Diagnosis Date  . Anesthesia complication    trouble waking up 2/2 CPAP  . Arthritis   . Asthma in adult   . Cervical spondylosis 2012   [facility] Grip, [place])  . Choroidal nevus of left eye 03/22/2016  . Chronic venous insufficiency 2016   severe reflux with painful varicosities sees [facility]  . Complication of anesthesia    difficulty coming out due to sleep apnea per pt  . Difficult intubation    PATIENT DENIES   . DVT (deep venous thrombosis) ([place]) 2011   small, developed after venous ablation  . Family history of adverse reaction to anesthesia    O2 levels drop upon waking   . Hearing loss sensory, bilateral 2013   mod-severe high freq sensorineural ([facility])  . History of kidney stones 1980s  . History of radiation therapy 07/19/11-08/25/2011   RIGHT AXILLARY REGION/METASTATIC  . Hypertension   . Kidney stones   . Melanoma ([place]) 06/29/10   MALIGNANT MELANOMA R SHOULDER/SUPRASCAPULAR BACK s/p interferon chemo and XRT  . Neuromuscular disorder ([facility])    muscle spasms  . OSA (obstructive sleep apnea)    on CPAP  . Pneumonia    2001  . RLS (restless legs syndrome)   . Seasonal allergies   . Tinnitus   . Unspecified vitamin D deficiency 03/18/2013    Past Surgical History:  Procedure Laterality Date  . ABI   2016   WNL  . ANTERIOR CERVICAL DECOMP/DISCECTOMY FUSION N/A 08/22/2015   ANTERIOR CERVICAL DECOMPRESSION FUSION C3/4 - interbody graft and anterior plate; Kristeen Miss, MD  . ANTERIOR CERVICAL DECOMP/DISCECTOMY FUSION N/A 07/02/2016   Procedure: Cervical four- five Cervical five- six Cervical six- seven Anterior cervical decompression/diskectomy/fusion;  Surgeon: Kristeen Miss, MD;  Location: [facility];  Service: Neurosurgery;  Laterality: N/A;  C4-5 C5-6 C6-7 Anterior cervical decompression/diskectomy/fusion  . BREAST BIOPSY Left 2013   BENIGN  . BREAST SURGERY Right 1990   BX   . CARDIOVASCULAR STRESS TEST  2010   normal stress test, EF 66%  . COLONOSCOPY  03/2005   benign polyp, int hem, rpt 5 yrs ([place], [place])  . DEEP AXILLARY SENTINEL NODE BIOPSY / EXCISION  2012   RIGHT  . DEXA  06/2016   WNL  . DILATION AND CURETTAGE OF UTERUS  2010  . ENDOVENOUS ABLATION SAPHENOUS VEIN W/ LASER Left 2010   [facility]  . ENDOVENOUS ABLATION SAPHENOUS VEIN W/ LASER Left 2016   accessory branch [facility] Kellie Simmering)  . ESOPHAGOGASTRODUODENOSCOPY  2006   mod chronic carditis, no H pylori ([place], [place])  . [place]   left  . LUMBAR LAMINECTOMY  2001   L4/5  . MELANOMA EXCISION  2011   Right shoulder, with sentinel lymph  node biopsy  . PORT-A-CATH REMOVAL  12/05/2011   Procedure: REMOVAL PORT-A-CATH;  Surgeon: Rolm Bookbinder, MD;  Location: [facility];  Service: General;  Laterality: N/A;  left port removal  . PORTACATH PLACEMENT     left subclavian    Vitals:   09/13/16 0930  Weight: 222 lb (100.7 kg)  Height: 5\' 3"  (1.6 m)        Subjective Assessment - 09/13/16 1340    Subjective  Pt is referred by [name], MD, for Occupational Therapy evaluation and treatment of long standing, BLE lymphedema. Pt endorses + maternal family history for leg swelling and poor circulation. She reports that onset of leg pain and distal swelling distally started many yers  ago, but has worsened over time.. Swelling no longer resolves with elevation. Pt states historically she has been able to tolerate compression stockings. She has not previously undergone lymphedema treatment.   Pertinent History CVI, Varicose veins, chronic bilateral leg pain, L>R, Hx L endovenous ablation x 2 s/p slow healing, L leg ulcers in 2009 and 2015, OSA ( cPap nightly), Hx LLE  DVT s/p vein ablation, gative for DVT 08/13/2016   Limitations  s/p L leg ulcers in 2009 and 2015   Patient Stated Goals reduce leg pain and swelling and keep it from getting worse   Currently in Pain? Yes   Pain Score 7    Pain Location Leg   Pain Orientation Right;Left   Pain Descriptors / Indicators Aching;Sore;Burning;Constant;Numbness;Tender;Tightness;Tingling;Discomfort;Tiring;Guarding;Restless;Heaviness   Pain Type Neuropathic pain;Chronic pain   Pain Onset More than a month ago   Pain Frequency Constant   Aggravating Factors  elevation,  (POSSIBLE ARTERIAL DISEASE????)  sitting for extended periods of time   Pain Relieving Factors moving them around   Effect of Pain on Daily Activities difficulty walking and limited functional mobility/ transfers, including bed mobility; limits basic and instrumental ADLs ( limits sleep, difficulty fitting LB clothing and street shoes, BLE skin care and inspection , limits LB bathing. Limits driving and community mobility, limits, houme management tasks, difficulty driving, , limits participation in productive activites at home and in the community, limits participation in leisure pursuits and socialization;  negative body image 2/2 leg disfigurement from variscosities and swelling.   Multiple Pain Sites Yes           [facility] OT Assessment - 09/13/16 0001      Assessment   Diagnosis Mild-moderate, Stage II, BLE lymphedema 2/2 chronic venous insufficiency and obesity   Prior Therapy none     Precautions   Precautions Other (comment)  Standard LE precautions     Home   Environment   Lives With Alone     Prior Function   Level of Independence Independent with basic ADLs;Independent with community mobility without device;Independent with homemaking with ambulation;Independent with gait;Independent with transfers;Independent with household mobility without device   Vocation Retired     Psychiatrist Expression   Dominant Hand Right     Vision - History   Baseline Vision Wears glasses all the time     Activity Tolerance   Activity Tolerance Comments decreased standing tolerance 2/2 leg pain and swelling     Cognition   Overall Cognitive Status Within Functional Limits for tasks assessed     Observation/Other Assessments   Skin Integrity BLE 2+ pitting edema distal legs. Increased tissue density w/ lymphatic congestion- skin is tight w/ mild indurated fibrosis. Fatty fibrosis at  medial and lateral malleoli bilterally. Difuse variscosities bilaterally w/ L>R. Legs are tender to light palpation. Skin temp is WNL and erythema is absent. No signs or symptoms of infection this date. Denies hx of lymphorrhea.     Sensation   Light Touch Not tested  P/p interft reports decreased sensation in fingers and feet      Coordination   Gross Motor Movements are Fluid and Coordinated Yes          LYMPHEDEMA/ONCOLOGY QUESTIONNAIRE - 09/13/16 1424      Lymphedema Assessments   Lymphedema Assessments Lower extremities     Right Lower Extremity Lymphedema   Other BLE Comparative Limb Volumetrics TBA at firt OT Rx visit                OT Treatments/Exercises (OP) - 09/13/16 0001      Bed Mobility   Bed Mobility Not assessed  Pt reports difficulty      Transfers   Comments legs are heavy causing difficulty performing transfers requiring leg lifting, including car transfers     ADLs   LB Dressing unable to reach feet 2/2 hip AROM limited by body habitus   Functional Mobility no device used at clinic   ADL  Education Given Yes               OT Education - 09/13/16 1426    Education provided Yes   Education Details Provided Pt/caregiver skilled education and ADL training throughout visit for lymphedema etiology, progression, and treatment including Intensive and Management Phase Complete Decongestive Therapy (CDT)  Discussed lymphedema precautions, cellulitis risk, and all CDT and LE self-care components, including compression wrapping/ garments & devices, lymphatic pumping ther ex, simple self-MLD, and skin care. Provided printed Lymphedema Workbook for reference.   Person(s) Educated Patient   Methods Explanation;Handout;Verbal cues;Tactile cues;Demonstration   Comprehension Verbalized understanding;Need further instruction             OT Long Term Goals - 09/13/16 1428      OT LONG TERM GOAL #1   Title Lymphedema (LE) management/ self-care: Pt able to apply multi layered, gradient compression wraps with MAX caregiver assistance using proper techniques within 2 weeks to achieve optimal limb volume reduction.   Baseline dependent   Time 2   Period Weeks   Status New     OT LONG TERM GOAL #2   Title Lymphedema (LE) management/ self-care:  Pt to achieve at least 10% LLE limb volume reductions bilaterally during Intensive CDT to limit LE progression, decrease infection and falls risk, to reduce pain/, and to improve safe ambulation and functional mobility.   Baseline dependent   Time 12   Period Weeks   Status New     OT LONG TERM GOAL #3   Title Lymphedema (LE) management/ self-care:  Pt >/= 85 % compliant with all daily, LE self-care protocols for home program w/ needed level of caregiver assistance , including simple self-manual lymphatic drainage (MLD), skin care, lymphatic pumping the ex, skin care, and donning/ doffing compression wraps and garments o limit LE progression and further functional decline.     Baseline dependent   Time 12   Period Weeks   Status New      OT LONG TERM GOAL #4   Baseline dependent   Time 12   Period Weeks   Status New     OT LONG TERM GOAL #5   Title Lymphedema (LE) management/ self-care:  Pt to tolerate daily compression wraps,  garments and devices in keeping w/ prescribed wear regime within 1 week of issue date to progress and retain clinical and functional gains and to limit LE progression.   Baseline dependent   Time 12   Period Weeks   Status New     Long Term Additional Goals   Additional Long Term Goals Yes     OT LONG TERM GOAL #6   Title Lymphedema (LE) self-care:  During Management Phase CDT Pt to sustain limb volume reductions achieved during Intensive Phase CDT within 5% utilizing LE self-care protocols, appropriate compression garments/ devices, and needed level of caregiver assistance.   Baseline dependent   Time 6   Period Months   Status New               Plan - 09/13/16 1435    Clinical Impression Statement Pt presents with mild-moderate, stage II, BLE lymphedema (LE) accompanied by chronic, constant leg pain 2/2 chronic venous insufficiency, obesity, and dependent positioning. Pt reports leg swelling started years ago without known precipitating event. She endorse positive family history on maternal side for leg pain, swelling, and poor circulation, and she has a history of DVT, venous wounds x 2 and vein ablation in the LLE.  Pt reports worsening leg pain and swelling over time which no longer resolves w/ elevation. In fact, Pt reports leg elevation worsens leg pain, suggesting possible peripheral arterial component. Chronic, progressive, BLE LE and associated leg pain and limits AROM, functional mobility and ambulation, limits basic and instrumental ADLs performance, limits leisure pursuits and productive activities, limits social participation , and negatively impacts body image. Skilled Occupational Therapy for LE care is medically necessary to reduce uncontrolled swelling and associated leg and  foot pain, to decrease risk of recurrent infections and diabetic skin complications, to improve safe ambulation and functional mobility and to limit fall risk due to body asymmetry, to increase level of independence with basic and instrumental ADLs, including mastery all LE self-care skills. Pt will also benefit from Physical Therapy evaluation and treatment to address decreased balance, c/o vestibular issues, decreased LE strength, and limited endurance. Without skilled Occupational Therapy for Intensive and Management phase Complete Decongestive Therapy (CDT) and ADL training for lymphedema-self management, this patient's condition is likely to worsen and further functional decline is expected   Clinical Impairments Affecting Rehab Potential pain   OT Frequency 3x / week   OT Duration 12 weeks   OT Treatment/Interventions Self-care/ADL training;Therapeutic exercise;Patient/family education;Manual Therapy;Manual lymph drainage;Other (comment);DME and/or AE instruction;Compression bandaging  skin care   Plan treat one leg at a time start w/ R by Pt request. Consider fitting w/ BLE, adjustable compression garments ( NOT CIRCAID 2/2 NEOPRENE ALLERGY) for day time for ease of donning and doffing; caregiver training for compression wrapping is essential for optimal clinical outcomes,    Recommended Other Services consider fitting w/ HOS devices to soften fibrosis, to improve tissue flexibility, and to facilitate improved lymphatoc function during HOS      Patient will benefit from skilled therapeutic intervention in order to improve the following deficits and impairments:  Abnormal gait, Decreased skin integrity, Decreased knowledge of precautions, Decreased activity tolerance, Decreased knowledge of use of DME, Impaired flexibility, Decreased mobility, Difficulty walking, Impaired sensation, Obesity, Decreased range of motion, Increased edema, Pain  Visit Diagnosis: Lymphedema, not elsewhere classified -  Plan: Ot plan of care cert/re-cert      G-Codes - [id number]    Functional Assessment Tool Used observation and examination, medical  records review, Pt and caregiver interview, comparative limb volumetrics   Functional Limitation Self care   Self Care Current Status [phone]) At least 80 percent but less than 100 percent impaired, limited or restricted   Self Care Goal Status [id number]) At least 20 percent but less than 40 percent impaired, limited or restricted      Problem List Patient Active Problem List   Diagnosis Date Noted  . Leg pain, bilateral 08/28/2016  . Pedal edema 08/13/2016  . 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 Kyphosis of cervical region 07/02/2016  . Intertrigo 03/22/2016  . Cervical myelopathy with cervical radiculopathy 08/18/2015  . Varicose veins of left lower extremity with complications [id number]  . Advanced care planning/counseling discussion 09/22/2014  . Medicare annual wellness visit, subsequent 05/15/2013  . Allergic rhinitis 04/03/2013  . Unspecified vitamin D deficiency 03/18/2013  . Hypertension   . Asthma in adult   . Chronic venous insufficiency   . Arthritis   . OSA (obstructive sleep apnea)   . RLS (restless legs syndrome)   . Melanoma ([place])     Andrey Spearman, MS, OTR/L, [facility] 09/13/16 2:53 PM  [place] MAIN [facility] SERVICES [address] [place], [place], [address] Phone: [phone]   Fax:  [phone]  Name: JARIELIZ RAQUET [name] Date of Birth: 14-Sep-1946

## 2016-09-13 NOTE — Patient Instructions (Signed)
Lymphedema Precautions  If you experience atypical shortness of breath, or notice any signs /symptoms of skin infection (aka cellulitis) remove all compression wraps/ garments, discontinue manual lymphatic drainage (MLD), and report symptoms to your physician immediately. Discontinue MLD and compression for 72 hours after you take your first oral antibiotic so not to spread the infection.   Lymphedema Self- Care Instructions  1. EXERCISE: Perform lymphatic pumping there ex 2 x a day. While wearing your compression wraps or garments. Perform 10 reps of each exercise bilaterally and be sure to perform them in order. Don;t skip around!  OMIT PARTIAL SIT UP  2. MLD: Perform simple self-Manual Lymphatic Drainage (MLD) at least once a day as directed.  3. WRAPS: Compression wraps are to be worn 23 hrs/ 7 days/wk during Intensive Phase of Complete Decongestive Therapy (CDT).Building tolerance may take time and practice, so don't get discouraged. If bandages begin to feel tight during periods of inactivity and/or during the night, try performing your exercises to loosen them.   4. GARMENTS: During Management Phase CDT your compression garments are to be worn during waking hours when active. Do NOT sleep in your garments!!   5. PUT YOUR FEET UP! Elevate your feet and legs and feet to the level of your heart whenever you are sitting down.   6. SKIN: Carefully monitor skin condition and perform impeccable hygiene daily. Bathe skin with mild soap and water and apply low pH lotion (aka Eucerin ) to improve hydration and limit infection risk.    Lymphatic Pumping Exercises:            

## 2016-09-14 ENCOUNTER — Telehealth: Payer: Self-pay

## 2016-09-14 DIAGNOSIS — R609 Edema, unspecified: Secondary | ICD-10-CM | POA: Diagnosis not present

## 2016-09-14 DIAGNOSIS — M1991 Primary osteoarthritis, unspecified site: Secondary | ICD-10-CM | POA: Diagnosis not present

## 2016-09-14 DIAGNOSIS — M419 Scoliosis, unspecified: Secondary | ICD-10-CM | POA: Diagnosis not present

## 2016-09-14 DIAGNOSIS — M4713 Other spondylosis with myelopathy, cervicothoracic region: Secondary | ICD-10-CM | POA: Diagnosis not present

## 2016-09-14 DIAGNOSIS — I872 Venous insufficiency (chronic) (peripheral): Secondary | ICD-10-CM

## 2016-09-14 DIAGNOSIS — M40203 Unspecified kyphosis, cervicothoracic region: Secondary | ICD-10-CM | POA: Diagnosis not present

## 2016-09-14 DIAGNOSIS — I10 Essential (primary) hypertension: Secondary | ICD-10-CM | POA: Diagnosis not present

## 2016-09-14 NOTE — Telephone Encounter (Signed)
Brandy notified. 

## 2016-09-14 NOTE — Telephone Encounter (Signed)
Brandy nurse with [facility] left v/m requesting home health nursing order for unna boot to one or both legs depending on what pt can tolerate;changing 1 - 2 times a week until pt can be seen in clinic. Pt cannot see lipedema clinic until after Thanksgiving. Brandy request cb.

## 2016-09-14 NOTE — Telephone Encounter (Signed)
Ok to do - ABIs stable 08/2015

## 2016-09-17 ENCOUNTER — Encounter: Payer: Self-pay | Admitting: Hematology & Oncology

## 2016-09-17 ENCOUNTER — Telehealth: Payer: Self-pay

## 2016-09-17 ENCOUNTER — Other Ambulatory Visit (HOSPITAL_BASED_OUTPATIENT_CLINIC_OR_DEPARTMENT_OTHER): Payer: Medicare Other

## 2016-09-17 ENCOUNTER — Ambulatory Visit (HOSPITAL_BASED_OUTPATIENT_CLINIC_OR_DEPARTMENT_OTHER): Payer: Medicare Other | Admitting: Hematology & Oncology

## 2016-09-17 VITALS — BP 142/72 | HR 66 | Temp 98.0°F | Wt 224.0 lb

## 2016-09-17 DIAGNOSIS — I119 Hypertensive heart disease without heart failure: Secondary | ICD-10-CM

## 2016-09-17 DIAGNOSIS — C4360 Malignant melanoma of unspecified upper limb, including shoulder: Secondary | ICD-10-CM

## 2016-09-17 DIAGNOSIS — I159 Secondary hypertension, unspecified: Secondary | ICD-10-CM

## 2016-09-17 DIAGNOSIS — C4361 Malignant melanoma of right upper limb, including shoulder: Secondary | ICD-10-CM | POA: Diagnosis not present

## 2016-09-17 DIAGNOSIS — G2581 Restless legs syndrome: Secondary | ICD-10-CM

## 2016-09-17 DIAGNOSIS — C4321 Malignant melanoma of right ear and external auricular canal: Secondary | ICD-10-CM

## 2016-09-17 DIAGNOSIS — I43 Cardiomyopathy in diseases classified elsewhere: Secondary | ICD-10-CM

## 2016-09-17 DIAGNOSIS — R600 Localized edema: Secondary | ICD-10-CM

## 2016-09-17 LAB — CBC WITH DIFFERENTIAL (CANCER CENTER ONLY)
BASO#: 0 10*3/uL (ref 0.0–0.2)
BASO%: 0.2 % (ref 0.0–2.0)
EOS%: 4.9 % (ref 0.0–7.0)
Eosinophils Absolute: 0.3 10*3/uL (ref 0.0–0.5)
HCT: 38.6 % (ref 34.8–46.6)
HGB: 12.5 g/dL (ref 11.6–15.9)
LYMPH#: 1.5 10*3/uL (ref 0.9–3.3)
LYMPH%: 25.6 % (ref 14.0–48.0)
MCH: 30.4 pg (ref 26.0–34.0)
MCHC: 32.4 g/dL (ref 32.0–36.0)
MCV: 94 fL (ref 81–101)
MONO#: 0.5 10*3/uL (ref 0.1–0.9)
MONO%: 9 % (ref 0.0–13.0)
NEUT#: 3.5 10*3/uL (ref 1.5–6.5)
NEUT%: 60.3 % (ref 39.6–80.0)
Platelets: 198 10*3/uL (ref 145–400)
RBC: 4.11 10*6/uL (ref 3.70–5.32)
RDW: 12.4 % (ref 11.1–15.7)
WBC: 5.9 10*3/uL (ref 3.9–10.0)

## 2016-09-17 LAB — COMPREHENSIVE METABOLIC PANEL
ALT: 18 U/L (ref 0–55)
AST: 10 U/L (ref 5–34)
Albumin: 3.6 g/dL (ref 3.5–5.0)
Alkaline Phosphatase: 71 U/L (ref 40–150)
Anion Gap: 10 mEq/L (ref 3–11)
BUN: 19.5 mg/dL (ref 7.0–26.0)
CO2: 28 mEq/L (ref 22–29)
Calcium: 9.8 mg/dL (ref 8.4–10.4)
Chloride: 105 mEq/L (ref 98–109)
Creatinine: 0.8 mg/dL (ref 0.6–1.1)
EGFR: 81 mL/min/{1.73_m2} — ABNORMAL LOW (ref >90)
Glucose: 123 mg/dl (ref 70–140)
Potassium: 3.5 mEq/L (ref 3.5–5.1)
Sodium: 143 mEq/L (ref 136–145)
Total Bilirubin: 0.39 mg/dL (ref 0.20–1.20)
Total Protein: 7.2 g/dL (ref 6.4–8.3)

## 2016-09-17 LAB — LACTATE DEHYDROGENASE: LDH: 187 U/L (ref 125–245)

## 2016-09-17 MED ORDER — METOLAZONE 2.5 MG PO TABS: ORAL_TABLET | ORAL | 4 refills | Status: DC

## 2016-09-17 NOTE — Progress Notes (Signed)
Hematology and Oncology Follow Up Visit  ADELENA DEVEGA [id number] 1946-01-27 70 y.o. 09/17/2016   Principle Diagnosis:  Stage IIIC (T3b N3 M0) melanoma of the right shoulder.  Current Therapy:    Observation     Interim History:  Ms.  Snowman is back for followup. The good news that she got through her neck surgery without any difficulties. She had fusion of C4-7. This was in late August. Unfortunately, she is hasn't complications. She has a little ptosis over of the left eye. 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  She has weakness of the left shoulder. She is taking some physical therapy for this.  The biggest problem that she has lower extremity edema bilaterally. I don't think this is related at all to her surgery. I'm not sure as to why she would have this edema. She has been evaluated by her family doctor. Dopplers were done which were negative.  She has some abdominal discomfort. There is no obvious change in bowel or bladder habits.  [name] had no rashes. Again the only exception would be on her lower extremities where she may have a little bit of stasis dermatitis.  She has had no cough.  Her neck is doing so much better. She naturally lift her head up now.  Overall, her performance status is ECOG 2.  Medications:  Current Outpatient Prescriptions:  .  acetaminophen (TYLENOL ARTHRITIS PAIN) 650 MG CR tablet, Take 1,300 mg by mouth every 8 (eight) hours as needed for pain. , Disp: , Rfl:  .  aspirin EC 81 MG tablet, Take 81 mg by mouth at bedtime. Melanoma prevention, Disp: , Rfl:  .  furosemide (LASIX) 40 MG tablet, Take 1 tablet (40 mg total) by mouth daily., Disp: 30 tablet, Rfl: 3 .  gabapentin (NEURONTIN) 300 MG capsule, Take 1 capsule (300 mg total) by mouth at bedtime., Disp: 30 capsule, Rfl: 3 .  KLOR-CON 10 10 MEQ tablet, TAKE 2 TABLETS BY MOUTH DAILY., Disp: 180 tablet, Rfl: 3 .  losartan-hydrochlorothiazide (HYZAAR) 50-12.5  MG tablet, TAKE 1 TABLET BY MOUTH DAILY., Disp: 30 tablet, Rfl: 5 .  montelukast (SINGULAIR) 10 MG tablet, Take 10 mg by mouth at bedtime., Disp: , Rfl:  .  Multiple Vitamin (MULTIVITAMIN WITH MINERALS) TABS tablet, Take 1 tablet by mouth daily., Disp: , Rfl:  .  Polyvinyl Alcohol-Povidone (TEARS PLUS OP), Place 1 drop into both eyes daily as needed (dry eyes/ redness/ burning)., Disp: , Rfl:  .  PRESCRIPTION MEDICATION, CPAP, Disp: , Rfl:  .  PROAIR HFA 108 (90 Base) MCG/ACT inhaler, INHALE 2 PUFFS INTO LUNGS EVERY 6 HOURS AS NEEDED FOR WHEEZING OR SHORTNESS OF BREATH, Disp: 8.5 Inhaler, Rfl: 3 .  Probiotic Product (PROBIOTIC DAILY PO), Take 1 tablet by mouth daily. , Disp: , Rfl:  .  Vitamin D, Ergocalciferol, (DRISDOL) 50000 units CAPS capsule, TAKE 1 CAPSULE (50,000 UNITS TOTAL) BY MOUTH EVERY 7 (SEVEN) DAYS. (Patient taking differently: TAKE 1 CAPSULE (50,000 UNITS TOTAL) BY MOUTH EVERY Wednesday), Disp: 4 capsule, Rfl: 3 .  metolazone (ZAROXOLYN) 2.5 MG tablet, Take 1 tablet daily 1 hour prior to furosemide., Disp: 30 tablet, Rfl: 4  Allergies:  Allergies  Allergen Reactions  . Sulfa Antibiotics Itching and Swelling    Facial swelling  . Latex Other (See Comments)    Blisters ONLY HAD REACTION TO TAPE  (??? ONLY ADHESIVE ALLERGY)  . Tape Other (See Comments)    Caused blisters -  must use paper tape - same reaction from [employer]    Past Medical History, Surgical history, Social history, and Family History were reviewed and updated.  Review of Systems: As above  Physical Exam:  weight is 224 lb (101.6 kg). Her oral temperature is 98 F (36.7 C). Her blood pressure is 142/72 (abnormal) and her pulse is 66.   [name] and well-nourished white female. Head and neck exam is no ocular or oral lesions. She has a healing cervical limits be scar on the anterior neck. There are no palpable cervical or supraclavicular lymph nodes. Lungs are clear. Cardiac exam regular rate and rhythm  with no murmurs rubs or bruits. Abdomen soft. Has good bowel sounds. Is no fluid wave. There is a palpable hepato- splenomegaly. Exam shows a wide local excision scar in the posterior right shoulder region. This well-healed. No tenderness over the spine ribs or hips. Axillary exam shows no bilateral axillary adenopathy. Extremities shows 2+ edema in her lower legs bilaterally. She may have some slight stasis dermatitis changes. She has decent pulses in her distal extremities. There is mild lymphedema noted in the right arm. Skin exam shows no suspicious skin lesions.  Lab Results  Component Value Date   WBC 5.9 09/17/2016   HGB 12.5 09/17/2016   HCT 38.6 09/17/2016   MCV 94 09/17/2016   PLT 198 09/17/2016     Chemistry      Component Value Date/Time   NA 142 08/13/2016 0937   NA 140 03/19/2016 1007   NA 147 (H) 09/19/2015 1007   K 4.0 08/13/2016 0937   K 4.0 03/19/2016 1007   K 4.2 09/19/2015 1007   CL 106 08/13/2016 0937   CL 107 03/19/2016 1007   CO2 32 08/13/2016 0937   CO2 30 03/19/2016 1007   CO2 31 (H) 09/19/2015 1007   BUN 16 08/13/2016 0937   BUN 18 03/19/2016 1007   BUN 18.4 09/19/2015 1007   CREATININE 0.71 08/13/2016 0937   CREATININE 0.9 03/19/2016 1007   CREATININE 0.8 09/19/2015 1007      Component Value Date/Time   CALCIUM 9.5 08/13/2016 0937   CALCIUM 9.5 03/19/2016 1007   CALCIUM 9.7 09/19/2015 1007   ALKPHOS 57 08/13/2016 0937   ALKPHOS 46 03/19/2016 1007   ALKPHOS 64 09/19/2015 1007   AST 11 08/13/2016 0937   AST 18 03/19/2016 1007   AST 9 09/19/2015 1007   ALT 17 08/13/2016 0937   ALT 28 03/19/2016 1007   ALT 13 09/19/2015 1007   BILITOT 0.4 08/13/2016 0937   BILITOT 0.70 03/19/2016 1007   BILITOT 0.31 09/19/2015 1007         Impression and Plan: Ms. Theriault is a 70 year old white female with stage IIIc melanoma of the right shoulder. She did undergo resection. She had 8 positive lymph nodes. She completed adjuvant interferon therapy in  December of 2012. She had radiation therapy to the right axilla.  IAm not certain as to what is going on with her. She clearly has the significant edema in her legs. As always, her family doctor has done a great job in trying to evaluate her. I do worry about recurrence of melanoma. As such, I will get a CT scan of her abdomen pelvis make sure that she does not have any lymphadenopathy them IV causes some of the lower extremity swelling.  I also would get an echocardiogram of her heart to make sure that she has no heart failure issues. I would not think  so as I cannot find anything on her physical exam that would suggest congestive heart failure.  I think that Zaroxolyn would be a good idea for her. She is on Lasix. I told her to take the Zaroxolyn one hour prior to the Lasix.  I spent about 25-30 minutes with her today. I just was not aware that she was having these issues. She waited until she saw [place] because she has a lot of faith in our office to try to help her feel better.  I will see her back in 3 weeks. [name], MD 11/13/201711:31 AM

## 2016-09-17 NOTE — Telephone Encounter (Signed)
Pt left v/m; pt was seen at lymphedema clinic last week and pt request Dr Darnell Level not to authorize lymphedema clinic's treatment plan until pt speaks with Dr Darnell Level. Lymphedema clinic should be sending Dr Darnell Level a report. Unable to reach pt for more info.

## 2016-09-18 ENCOUNTER — Encounter: Payer: Medicare Other | Admitting: Occupational Therapy

## 2016-09-19 ENCOUNTER — Other Ambulatory Visit: Payer: Self-pay | Admitting: Family Medicine

## 2016-09-20 ENCOUNTER — Ambulatory Visit (HOSPITAL_BASED_OUTPATIENT_CLINIC_OR_DEPARTMENT_OTHER)
Admission: RE | Admit: 2016-09-20 | Discharge: 2016-09-20 | Disposition: A | Discharge status: Home / Self Care | Payer: Medicare Other | Source: Ambulatory Visit | Attending: Hematology & Oncology | Admitting: Hematology & Oncology

## 2016-09-20 DIAGNOSIS — K5730 Diverticulosis of large intestine without perforation or abscess without bleeding: Secondary | ICD-10-CM | POA: Diagnosis not present

## 2016-09-20 DIAGNOSIS — E669 Obesity, unspecified: Secondary | ICD-10-CM | POA: Diagnosis not present

## 2016-09-20 DIAGNOSIS — R590 Localized enlarged lymph nodes: Secondary | ICD-10-CM | POA: Insufficient documentation

## 2016-09-20 DIAGNOSIS — R600 Localized edema: Secondary | ICD-10-CM

## 2016-09-20 DIAGNOSIS — C4360 Malignant melanoma of unspecified upper limb, including shoulder: Secondary | ICD-10-CM | POA: Diagnosis not present

## 2016-09-20 DIAGNOSIS — Z6839 Body mass index (BMI) 39.0-39.9, adult: Secondary | ICD-10-CM | POA: Diagnosis not present

## 2016-09-20 DIAGNOSIS — K760 Fatty (change of) liver, not elsewhere classified: Secondary | ICD-10-CM | POA: Insufficient documentation

## 2016-09-20 DIAGNOSIS — I43 Cardiomyopathy in diseases classified elsewhere: Secondary | ICD-10-CM

## 2016-09-20 DIAGNOSIS — I351 Nonrheumatic aortic (valve) insufficiency: Secondary | ICD-10-CM | POA: Diagnosis not present

## 2016-09-20 DIAGNOSIS — K449 Diaphragmatic hernia without obstruction or gangrene: Secondary | ICD-10-CM | POA: Diagnosis not present

## 2016-09-20 DIAGNOSIS — I700 Atherosclerosis of aorta: Secondary | ICD-10-CM | POA: Diagnosis not present

## 2016-09-20 DIAGNOSIS — I119 Hypertensive heart disease without heart failure: Secondary | ICD-10-CM

## 2016-09-20 DIAGNOSIS — R599 Enlarged lymph nodes, unspecified: Secondary | ICD-10-CM | POA: Diagnosis not present

## 2016-09-20 DIAGNOSIS — Z87891 Personal history of nicotine dependence: Secondary | ICD-10-CM | POA: Insufficient documentation

## 2016-09-20 MED ORDER — IOPAMIDOL (ISOVUE-300) INJECTION 61%
100.0000 mL | Freq: Once | INTRAVENOUS | Status: AC | PRN
  Administered 2016-09-20: 100 mL via INTRAVENOUS

## 2016-09-20 NOTE — Telephone Encounter (Signed)
Noted. I see patient has canceled OT sessions. Will review at f/u visit next week.

## 2016-09-20 NOTE — Progress Notes (Signed)
  Echocardiogram 2D Echocardiogram has been performed.  Tresa Res 09/20/2016, 9:55 AM

## 2016-09-21 ENCOUNTER — Ambulatory Visit: Payer: Medicare Other | Admitting: Occupational Therapy

## 2016-09-21 DIAGNOSIS — M40203 Unspecified kyphosis, cervicothoracic region: Secondary | ICD-10-CM | POA: Diagnosis not present

## 2016-09-21 DIAGNOSIS — M419 Scoliosis, unspecified: Secondary | ICD-10-CM | POA: Diagnosis not present

## 2016-09-21 DIAGNOSIS — M1991 Primary osteoarthritis, unspecified site: Secondary | ICD-10-CM | POA: Diagnosis not present

## 2016-09-21 DIAGNOSIS — I10 Essential (primary) hypertension: Secondary | ICD-10-CM | POA: Diagnosis not present

## 2016-09-21 DIAGNOSIS — M4713 Other spondylosis with myelopathy, cervicothoracic region: Secondary | ICD-10-CM | POA: Diagnosis not present

## 2016-09-21 DIAGNOSIS — R609 Edema, unspecified: Secondary | ICD-10-CM | POA: Diagnosis not present

## 2016-09-24 ENCOUNTER — Ambulatory Visit (INDEPENDENT_AMBULATORY_CARE_PROVIDER_SITE_OTHER): Payer: Medicare Other

## 2016-09-24 VITALS — BP 110/70 | HR 63 | Temp 97.9°F | Ht 62.5 in | Wt 216.5 lb

## 2016-09-24 DIAGNOSIS — Z0000 Encounter for general adult medical examination without abnormal findings: Secondary | ICD-10-CM

## 2016-09-24 NOTE — Progress Notes (Signed)
Pre visit review using our clinic review tool, if applicable. No additional management support is needed unless otherwise documented below in the visit note. 

## 2016-09-24 NOTE — Progress Notes (Signed)
Subjective:   [name] is a 70 y.o. female who presents for Medicare Annual (Subsequent) preventive examination.  Review of Systems:  N/A Cardiac Risk Factors include: advanced age (>26men, >81 women);obesity (BMI >30kg/m2);hypertension     Objective:     Vitals: BP 110/70 (BP Location: Left Arm, Patient Position: Sitting, Cuff Size: Normal)   Pulse 63   Temp 97.9 F (36.6 C) (Oral)   Ht 5' 2.5" (1.588 m) Comment: no shoes  Wt 216 lb 8 oz (98.2 kg)   SpO2 93%   BMI 38.97 kg/m   Body mass index is 38.97 kg/m.   Tobacco History  Smoking Status  . Former Smoker  . Types: Cigarettes  . Quit date: 11/05/1965  Smokeless Tobacco  . Never Used    Comment: socially as a teen     Counseling given: No   Past Medical History:  Diagnosis Date  . Anesthesia complication    trouble waking up 2/2 CPAP  . Arthritis   . Asthma in adult   . Cervical spondylosis 2012   [facility], [place])  . Choroidal nevus of left eye 03/22/2016  . Chronic venous insufficiency 2016   severe reflux with painful varicosities sees [facility]  . Complication of anesthesia    difficulty coming out due to sleep apnea per pt  . Difficult intubation    PATIENT DENIES   . DVT (deep venous thrombosis) ([place]) 2011   small, developed after venous ablation  . Family history of adverse reaction to anesthesia    O2 levels drop upon waking   . Hearing loss sensory, bilateral 2013   mod-severe high freq sensorineural ([facility])  . History of kidney stones 1980s  . History of radiation therapy 07/19/11-08/25/2011   RIGHT AXILLARY REGION/METASTATIC  . Hypertension   . Kidney stones   . Melanoma ([place]) 06/29/10   MALIGNANT MELANOMA R SHOULDER/SUPRASCAPULAR BACK s/p interferon chemo and XRT  . Neuromuscular disorder ([facility])    muscle spasms  . OSA (obstructive sleep apnea)    on CPAP  . Pneumonia    2001  . RLS (restless legs syndrome)   . Seasonal allergies   . Tinnitus   . Unspecified vitamin D  deficiency 03/18/2013   Past Surgical History:  Procedure Laterality Date  . ABI  2016   WNL  . ANTERIOR CERVICAL DECOMP/DISCECTOMY FUSION N/A 08/22/2015   ANTERIOR CERVICAL DECOMPRESSION FUSION C3/4 - interbody graft and anterior plate; Kristeen Miss, MD  . ANTERIOR CERVICAL DECOMP/DISCECTOMY FUSION N/A 07/02/2016   Procedure: Cervical four- five Cervical five- six Cervical six- seven Anterior cervical decompression/diskectomy/fusion;  Surgeon: Kristeen Miss, MD;  Location: [facility];  Service: Neurosurgery;  Laterality: N/A;  C4-5 C5-6 C6-7 Anterior cervical decompression/diskectomy/fusion  . BREAST BIOPSY Left 2013   BENIGN  . BREAST SURGERY Right 1990   BX   . CARDIOVASCULAR STRESS TEST  2010   normal stress test, EF 66%  . COLONOSCOPY  03/2005   benign polyp, int hem, rpt 5 yrs ([place], [place])  . DEEP AXILLARY SENTINEL NODE BIOPSY / EXCISION  2012   RIGHT  . DEXA  06/2016   WNL  . DILATION AND CURETTAGE OF UTERUS  2010  . ENDOVENOUS ABLATION SAPHENOUS VEIN W/ LASER Left 2010   [facility]  . ENDOVENOUS ABLATION SAPHENOUS VEIN W/ LASER Left 2016   accessory branch [facility])  . ESOPHAGOGASTRODUODENOSCOPY  2006   mod chronic carditis, no H pylori ([place], [place])  . KNEE SURGERY  1985   left  . LUMBAR LAMINECTOMY  2001   L4/5  . MELANOMA EXCISION  2011   Right shoulder, with sentinel lymph node biopsy  . PORT-A-CATH REMOVAL  12/05/2011   Procedure: REMOVAL PORT-A-CATH;  Surgeon: Rolm Bookbinder, MD;  Location: [place];  Service: General;  Laterality: N/A;  left port removal  . PORTACATH PLACEMENT     left subclavian   Family History  Problem Relation Age of Onset  . Cancer Mother 35    uterine  . CAD Father 5    MI  . Hypertension Father 39  . Alzheimer's disease Father 4  . Cancer Cousin     x2, breast  . Diabetes Sister   . Cancer Paternal Grandmother     melanoma, possibly   History  Sexual Activity  . Sexual activity: No     Outpatient Encounter Prescriptions as of 09/24/2016  Medication Sig  . acetaminophen (TYLENOL ARTHRITIS PAIN) 650 MG CR tablet Take 1,300 mg by mouth every 8 (eight) hours as needed for pain.   Marland Kitchen aspirin EC 81 MG tablet Take 81 mg by mouth at bedtime. Melanoma prevention  . furosemide (LASIX) 40 MG tablet Take 1 tablet (40 mg total) by mouth daily.  Marland Kitchen gabapentin (NEURONTIN) 300 MG capsule Take 1 capsule (300 mg total) by mouth at bedtime.  Marland Kitchen KLOR-CON 10 10 MEQ tablet TAKE 2 TABLETS BY MOUTH DAILY.  Marland Kitchen losartan-hydrochlorothiazide (HYZAAR) 50-12.5 MG tablet TAKE 1 TABLET BY MOUTH DAILY.  . metolazone (ZAROXOLYN) 2.5 MG tablet Take 1 tablet daily 1 hour prior to furosemide.  . montelukast (SINGULAIR) 10 MG tablet TAKE 1 TABLET BY MOUTH DAILY  . Multiple Vitamin (MULTIVITAMIN WITH MINERALS) TABS tablet Take 1 tablet by mouth daily.  . Polyvinyl Alcohol-Povidone (TEARS PLUS OP) Place 1 drop into both eyes daily as needed (dry eyes/ redness/ burning).  Marland Kitchen PRESCRIPTION MEDICATION CPAP  . PROAIR HFA 108 (90 Base) MCG/ACT inhaler INHALE 2 PUFFS INTO LUNGS EVERY 6 HOURS AS NEEDED FOR WHEEZING OR SHORTNESS OF BREATH  . Probiotic Product (PROBIOTIC DAILY PO) Take 1 tablet by mouth daily.   . Vitamin D, Ergocalciferol, (DRISDOL) 50000 units CAPS capsule TAKE 1 CAPSULE (50,000 UNITS TOTAL) BY MOUTH EVERY 7 (SEVEN) DAYS.  . [DISCONTINUED] montelukast (SINGULAIR) 10 MG tablet Take 10 mg by mouth at bedtime.   No facility-administered encounter medications on file as of 09/24/2016.     Activities of Daily Living In your present state of health, do you have any difficulty performing the following activities: 09/24/2016 06/25/2016  Hearing? N N  Vision? N N  Difficulty concentrating or making decisions? N N  Walking or climbing stairs? N N  Dressing or bathing? N N  Doing errands, shopping? N -  Preparing Food and eating ? N -  Using the Toilet? N -  In the past six months, have you accidently leaked  urine? N -  Do you have problems with loss of bowel control? N -  Managing your Medications? N -  Managing your Finances? N -  Housekeeping or managing your Housekeeping? N -  Some recent data might be hidden    Patient Care Team: [name], MD as PCP - General (Family Medicine) [name], MD as Consulting Physician (Oncology) Kristeen Miss, MD as Consulting Physician (Neurosurgery) Danella Sensing, MD as Consulting Physician (Dermatology) Rigoberto Noel, MD as Consulting Physician (Pulmonary Disease) [place], DPM as Consulting Physician (Podiatry) Eulogio Bear, MD as  Consulting Physician (Ophthalmology)    Assessment:     Hearing Screening   125Hz  250Hz  500Hz  1000Hz  2000Hz  3000Hz  4000Hz  6000Hz  8000Hz   Right ear:   40 40 40  40    Left ear:   40 40 40  0    Vision Screening Comments: Last vision exam at [facility] in October 2017   Exercise Activities and Dietary recommendations Current Exercise Habits: The patient does not participate in regular exercise at present (pt states she walks often when shopping ), Exercise limited by: None identified  Goals    . Increase physical activity          Starting 09/24/2016, I will continue to walk at least 30 min 3-5 days per week.       Fall Risk Fall Risk  09/24/2016 03/19/2016 09/23/2015 03/18/2015 09/22/2014  Falls in the past year? No No No No No   Depression Screen PHQ 2/9 Scores 09/24/2016 09/23/2015 09/22/2014 05/15/2013  PHQ - 2 Score 0 0 0 0     Cognitive Function MMSE - Mini Mental State Exam 09/24/2016  Orientation to time 5  Orientation to Place 5  Registration 3  Attention/ Calculation 0  Recall 3  Language- name 2 objects 0  Language- repeat 1  Language- follow 3 step command 3  Language- read & follow direction 0  Write a sentence 0  Copy design 0  Total score 20     PLEASE NOTE: A Mini-Cog screen was completed. Maximum score is 20. A value of 0 denotes this part  of [place] MMSE was not completed or the patient failed this part of the Mini-Cog screening.   Mini-Cog Screening Orientation to Time - Max 5 pts Orientation to Place - Max 5 pts Registration - Max 3 pts Recall - Max 3 pts Language Repeat - Max 1 pts Language Follow 3 Step Command - Max 3 pts     Immunization History  Administered Date(s) Administered  . Influenza, High Dose Seasonal PF 08/13/2016  . Influenza,inj,Quad PF,36+ Mos 08/06/2013, 09/17/2014, 08/18/2015  . Pneumococcal Conjugate-13 09/22/2014  . Pneumococcal Polysaccharide-23 05/15/2013   Screening Tests Health Maintenance  Topic Date Due  . DTaP/Tdap/Td (1 - Tdap) 11/05/2018 (Originally 07/09/1965)  . COLONOSCOPY  09/24/2021 (Originally 03/19/2010)  . ZOSTAVAX  09/24/2029 (Originally 07/09/2006)  . MAMMOGRAM  06/18/2018  . TETANUS/TDAP  11/05/2018  . INFLUENZA VACCINE  Completed  . DEXA SCAN  Completed  . Hepatitis C Screening  Completed  . PNA vac Low Risk Adult  Completed      Plan:    I have personally reviewed and addressed the Medicare Annual Wellness questionnaire and have noted the following in the patient's chart:  A. Medical and social history B. Use of alcohol, tobacco or illicit drugs  C. Current medications and supplements D. Functional ability and status E.  Nutritional status F.  Physical activity G. Advance directives H. List of other physicians I.  Hospitalizations, surgeries, and ER visits in previous 12 months J.  [place] to include hearing, vision, cognitive, depression L. Referrals and appointments - none  In addition, I have reviewed and discussed with patient certain preventive protocols, quality metrics, and best practice recommendations. A written personalized care plan for preventive services as well as general preventive health recommendations were provided to patient.  See attached scanned questionnaire for additional information.   Signed,   Lindell Noe, MHA, BS,  LPN Health Coach

## 2016-09-24 NOTE — Patient Instructions (Signed)
Ms. Cregan , Thank you for taking time to come for your Medicare Wellness Visit. I appreciate your ongoing commitment to your health goals. Please review the following plan we discussed and let me know if I can assist you in the future.   These are the goals we discussed: Goals    . Increase physical activity          Starting 09/24/2016, I will continue to walk at least 30 min 3-5 days per week.        This is a list of the screening recommended for you and due dates:  Health Maintenance  Topic Date Due  . DTaP/Tdap/Td vaccine (1 - Tdap) 11/05/2018*  . Colon Cancer Screening  09/24/2021*  . Shingles Vaccine  09/24/2029*  . Mammogram  06/18/2018  . Tetanus Vaccine  11/05/2018  . Flu Shot  Completed  . DEXA scan (bone density measurement)  Completed  .  Hepatitis C: One time screening is recommended by Center for Disease Control  (CDC) for  adults born from [birth year] through 1965.   Completed  . Pneumonia vaccines  Completed  *Topic was postponed. The date shown is not the original due date.   Preventive Care for Adults  A healthy lifestyle and preventive care can promote health and wellness. Preventive health guidelines for adults include the following key practices.  . A routine yearly physical is a good way to check with your health care provider about your health and preventive screening. It is a chance to share any concerns and updates on your health and to receive a thorough exam.  . Visit your dentist for a routine exam and preventive care every 6 months. Brush your teeth twice a day and floss once a day. Good oral hygiene prevents tooth decay and gum disease.  . The frequency of eye exams is based on your age, health, family medical history, use  of contact lenses, and other factors. Follow your health care provider's ecommendations for frequency of eye exams.  . Eat a healthy diet. Foods like vegetables, fruits, whole grains, low-fat dairy products, and lean protein foods  contain the nutrients you need without too many calories. Decrease your intake of foods high in solid fats, added sugars, and salt. Eat the right amount of calories for you. Get information about a proper diet from your health care provider, if necessary.  . Regular physical exercise is one of the most important things you can do for your health. Most adults should get at least 150 minutes of moderate-intensity exercise (any activity that increases your heart rate and causes you to sweat) each week. In addition, most adults need muscle-strengthening exercises on 2 or more days a week.  Silver Sneakers may be a benefit available to you. To determine eligibility, you may visit the website: www.silversneakers.com or contact program at 867-751-7588 Mon-Fri between 8AM-8PM.   . Maintain a healthy weight. The body mass index (BMI) is a screening tool to identify possible weight problems. It provides an estimate of body fat based on height and weight. Your health care provider can find your BMI and can help you achieve or maintain a healthy weight.   For adults 20 years and older: ? A BMI below 18.5 is considered underweight. ? A BMI of 18.5 to 24.9 is normal. ? A BMI of 25 to 29.9 is considered overweight. ? A BMI of 30 and above is considered obese.   . Maintain normal blood lipids and cholesterol levels by exercising and  minimizing your intake of saturated fat. Eat a balanced diet with plenty of fruit and vegetables. Blood tests for lipids and cholesterol should begin at age 33 and be repeated every 5 years. If your lipid or cholesterol levels are high, you are over 50, or you are at high risk for heart disease, you may need your cholesterol levels checked more frequently. Ongoing high lipid and cholesterol levels should be treated with medicines if diet and exercise are not working.  . If you smoke, find out from your health care provider how to quit. If you do not use tobacco, please do not  start.  . If you choose to drink alcohol, please do not consume more than 2 drinks per day. One drink is considered to be 12 ounces (355 mL) of beer, 5 ounces (148 mL) of wine, or 1.5 ounces (44 mL) of liquor.  . If you are 57-49 years old, ask your health care provider if you should take aspirin to prevent strokes.  . Use sunscreen. Apply sunscreen liberally and repeatedly throughout the day. You should seek shade when your shadow is shorter than you. Protect yourself by wearing long sleeves, pants, a wide-brimmed hat, and sunglasses year round, whenever you are outdoors.  . Once a month, do a whole body skin exam, using a mirror to look at the skin on your back. Tell your health care provider of new moles, moles that have irregular borders, moles that are larger than a pencil eraser, or moles that have changed in shape or color.

## 2016-09-24 NOTE — Progress Notes (Signed)
PCP notes:   Health maintenance:  Tetanus - per pt, report vaccine administered in 2010 Shingles - pt declined Colon cancer screening - pt will discuss with PCP at next appt  Abnormal screenings:   Hearing - failed  Patient concerns:   Pt requested Unna Boot and Coban dressing be removed from lower left leg and be replaced. Advised PCP. Dressing was removed. No weeping or ulcers present on leg. Per PCP instructions, pt was advised to leave dressing off until next [facility] nurse visit on 09/25/16. Pt verbalized understanding.   Nurse concerns:  None  Next PCP appt:   10/01/16 @ 1130

## 2016-09-25 DIAGNOSIS — I10 Essential (primary) hypertension: Secondary | ICD-10-CM | POA: Diagnosis not present

## 2016-09-25 DIAGNOSIS — R609 Edema, unspecified: Secondary | ICD-10-CM | POA: Diagnosis not present

## 2016-09-25 DIAGNOSIS — M1991 Primary osteoarthritis, unspecified site: Secondary | ICD-10-CM | POA: Diagnosis not present

## 2016-09-25 DIAGNOSIS — M4713 Other spondylosis with myelopathy, cervicothoracic region: Secondary | ICD-10-CM | POA: Diagnosis not present

## 2016-09-25 DIAGNOSIS — M419 Scoliosis, unspecified: Secondary | ICD-10-CM | POA: Diagnosis not present

## 2016-09-25 DIAGNOSIS — M40203 Unspecified kyphosis, cervicothoracic region: Secondary | ICD-10-CM | POA: Diagnosis not present

## 2016-09-26 DIAGNOSIS — I10 Essential (primary) hypertension: Secondary | ICD-10-CM | POA: Diagnosis not present

## 2016-09-26 DIAGNOSIS — M4713 Other spondylosis with myelopathy, cervicothoracic region: Secondary | ICD-10-CM | POA: Diagnosis not present

## 2016-09-26 DIAGNOSIS — M40203 Unspecified kyphosis, cervicothoracic region: Secondary | ICD-10-CM | POA: Diagnosis not present

## 2016-09-26 DIAGNOSIS — M419 Scoliosis, unspecified: Secondary | ICD-10-CM | POA: Diagnosis not present

## 2016-09-26 DIAGNOSIS — M1991 Primary osteoarthritis, unspecified site: Secondary | ICD-10-CM | POA: Diagnosis not present

## 2016-09-26 DIAGNOSIS — R609 Edema, unspecified: Secondary | ICD-10-CM | POA: Diagnosis not present

## 2016-09-30 NOTE — Progress Notes (Signed)
I reviewed health advisor's note, was available for consultation, and agree with documentation and plan.  

## 2016-10-01 ENCOUNTER — Ambulatory Visit (INDEPENDENT_AMBULATORY_CARE_PROVIDER_SITE_OTHER): Payer: Medicare Other | Admitting: Podiatry

## 2016-10-01 ENCOUNTER — Ambulatory Visit (INDEPENDENT_AMBULATORY_CARE_PROVIDER_SITE_OTHER): Payer: Medicare Other | Admitting: Family Medicine

## 2016-10-01 ENCOUNTER — Encounter: Payer: Self-pay | Admitting: Family Medicine

## 2016-10-01 ENCOUNTER — Ambulatory Visit: Payer: Medicare Other | Admitting: Podiatry

## 2016-10-01 ENCOUNTER — Encounter: Payer: Self-pay | Admitting: Podiatry

## 2016-10-01 VITALS — BP 102/72 | HR 77 | Temp 98.2°F | Resp 16 | Ht 63.0 in | Wt 219.2 lb

## 2016-10-01 VITALS — Wt 224.0 lb

## 2016-10-01 DIAGNOSIS — Z7189 Other specified counseling: Secondary | ICD-10-CM | POA: Diagnosis not present

## 2016-10-01 DIAGNOSIS — B351 Tinea unguium: Secondary | ICD-10-CM

## 2016-10-01 DIAGNOSIS — M79605 Pain in left leg: Secondary | ICD-10-CM

## 2016-10-01 DIAGNOSIS — M79676 Pain in unspecified toe(s): Secondary | ICD-10-CM | POA: Diagnosis not present

## 2016-10-01 DIAGNOSIS — M79604 Pain in right leg: Secondary | ICD-10-CM

## 2016-10-01 DIAGNOSIS — R600 Localized edema: Secondary | ICD-10-CM | POA: Diagnosis not present

## 2016-10-01 DIAGNOSIS — C4360 Malignant melanoma of unspecified upper limb, including shoulder: Secondary | ICD-10-CM

## 2016-10-01 DIAGNOSIS — I159 Secondary hypertension, unspecified: Secondary | ICD-10-CM

## 2016-10-01 DIAGNOSIS — M5412 Radiculopathy, cervical region: Secondary | ICD-10-CM

## 2016-10-01 DIAGNOSIS — M4712 Other spondylosis with myelopathy, cervical region: Secondary | ICD-10-CM

## 2016-10-01 DIAGNOSIS — G959 Disease of spinal cord, unspecified: Secondary | ICD-10-CM

## 2016-10-01 MED ORDER — LOSARTAN POTASSIUM 50 MG PO TABS: 50.0000 mg | ORAL_TABLET | Freq: Every day | ORAL | 3 refills | Status: DC

## 2016-10-01 MED ORDER — GABAPENTIN 100 MG PO CAPS: 100.0000 mg | ORAL_CAPSULE | Freq: Every day | ORAL | 3 refills | Status: DC

## 2016-10-01 NOTE — Assessment & Plan Note (Signed)
Presumed edema related. Gabapentin helpful but overly sedating so will decrease to 100mg  nightly.

## 2016-10-01 NOTE — Assessment & Plan Note (Addendum)
Improving on metolazone + lasix. Will d/c HCTZ. Will likely need rpt Cr if metolazone continued. Recent CT showed mildly enlarged inguinal LAD. Appreciate onc assistance

## 2016-10-01 NOTE — Patient Instructions (Addendum)
Decrease gabapentin to 100mg  nightly Change losartan hctz to plain losartan 50mg  daily.  Good to see you today.  Return to see me in 3 months for follow up visit.

## 2016-10-01 NOTE — Assessment & Plan Note (Addendum)
Advanced directives: doesn't want prolonged life support. Unsure who would be HCPOA - likely one of her sisters. Packet previously provided.

## 2016-10-01 NOTE — Progress Notes (Signed)
Complaint:  Visit Type: Patient returns to my office for continued preventative foot care services. Complaint: Patient states" my nails have grown long and thick and become painful to walk and wear shoes"  The patient presents for preventative foot care services. No changes to ROS  Podiatric Exam: Vascular: dorsalis pedis and posterior tibial pulses are palpable bilateral. Capillary return is immediate. Temperature gradient is WNL. Skin turgor WNL  Sensorium: Normal Semmes Weinstein monofilament test. Normal tactile sensation bilaterally. Nail Exam: Pt has thick disfigured discolored nails with subungual debris noted bilateral entire nail hallux through fifth toenails Ulcer Exam: There is no evidence of ulcer or pre-ulcerative changes or infection. Orthopedic Exam: Muscle tone and strength are WNL. No limitations in general ROM. No crepitus or effusions noted. Foot type and digits show no abnormalities. Bony prominences are unremarkable. Skin: No Porokeratosis. No infection or ulcers.  Asymptomatic clavi.  Diagnosis:  Onychomycosis, , Pain in right toe, pain in left toes  Treatment & Plan Procedures and Treatment: Consent by patient was obtained for treatment procedures. The patient understood the discussion of treatment and procedures well. All questions were answered thoroughly reviewed. Debridement of mycotic and hypertrophic toenails, 1 through 5 bilateral and clearing of subungual debris. No ulceration, no infection noted. Patient requests no dremel use. Return Visit-Office Procedure: Patient instructed to return to the office for a follow up visit 3 months for continued evaluation and treatment.    Gardiner Barefoot DPM

## 2016-10-01 NOTE — Assessment & Plan Note (Signed)
Chronic, borderline low today with some unsteadiness endorsed by patient. Will d/c HCTZ component, continue plain losartan along with metolazone and lasix.

## 2016-10-01 NOTE — Assessment & Plan Note (Signed)
Doing well after recent cervical surgery 06/2016

## 2016-10-01 NOTE — Progress Notes (Signed)
Pre visit review using our clinic review tool, if applicable. No additional management support is needed unless otherwise documented below in the visit note/SLS  

## 2016-10-01 NOTE — Progress Notes (Signed)
BP 102/72 (BP Location: Left Arm, Patient Position: Sitting, Cuff Size: Large)   Pulse 77   Temp 98.2 F (36.8 C) (Oral)   Resp 16   Ht '5\' 3"'  (1.6 m)   Wt 219 lb 4 oz (99.5 kg)   SpO2 96%   BMI 38.84 kg/m    CC: f/u visit Subjective:    Patient ID: [name], female    DOB: [birth year], 70 y.o.   MRN: [id number]  HPI: [name] is a 70 y.o. female presenting on 10/01/2016 for Annual Exam (Pt c/o continued edema in lower legs and feet, bilaterally with pain radiating to knee and lower thigh) and Medication Problem (Patient would like to discuss lowering dosage on Gabapentin)   Saw Lesia for medicare wellness visit last week, note reviewed.   Ongoing pedal edema since cervical surgery. Did not have great experience with lymphedema clinic. Pt has decided to hold off on lymphedema eval at this time.   Saw Dr Marin Olp - given h/o melanoma, had CT scan done showing mildly enlarged inguinal lymphadenopathy. Planned f/u with onc next week to discuss f/u. Metolazone daily was added to lasix with improvement in swelling. Reviewed recent imaging (CT, echo) with patient.   Preventative: Colonoscopy 2007 per pt. Due for this but wants to await improvement of leg edema prior to rescheduling colonoscopy.  Well woman exam with OBGYN (Dr [name]). To establish with local OBGYN Mammogram WNL 06/2016 DEXA Date: 06/2016 WNL.  Flu shot - yearly Tetanus ~2010 Pneumovax 05/2013 , prevnar 09/2014 zostavax - did not do - too expensive. Advanced directives: doesn't want prolonged life support. Unsure who would be HCPOA - likely one of her sisters. Packet previously provided.  Seat belt use discussed.  Sunscreen use discussed. No changing moles on skin. Sees derm and onc yearly (h/o melanoma).  Ex smoker. Alcohol - rare  Lives alone. Sister and husband live next door  Lives in [place].  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  Edu: some college Activity: no regular  exercise Diet: poor - good water, rare fruits/vegetables  Relevant past medical, surgical, family and social history reviewed and updated as indicated. Interim medical history since our last visit reviewed. Allergies and medications reviewed and updated. Current Outpatient Prescriptions on File Prior to Visit  Medication Sig  . acetaminophen (TYLENOL ARTHRITIS PAIN) 650 MG CR tablet Take 1,300 mg by mouth every 8 (eight) hours as needed for pain.   Marland Kitchen aspirin EC 81 MG tablet Take 81 mg by mouth at bedtime. Melanoma prevention  . furosemide (LASIX) 40 MG tablet Take 1 tablet (40 mg total) by mouth daily.  Marland Kitchen KLOR-CON 10 10 MEQ tablet TAKE 2 TABLETS BY MOUTH DAILY.  Marland Kitchen losartan-hydrochlorothiazide (HYZAAR) 50-12.5 MG tablet TAKE 1 TABLET BY MOUTH DAILY.  . metolazone (ZAROXOLYN) 2.5 MG tablet Take 1 tablet daily 1 hour prior to furosemide.  . montelukast (SINGULAIR) 10 MG tablet TAKE 1 TABLET BY MOUTH DAILY  . Multiple Vitamin (MULTIVITAMIN WITH MINERALS) TABS tablet Take 1 tablet by mouth daily.  . Polyvinyl Alcohol-Povidone (TEARS PLUS OP) Place 1 drop into both eyes daily as needed (dry eyes/ redness/ burning).  Marland Kitchen PRESCRIPTION MEDICATION CPAP  . PROAIR HFA 108 (90 Base) MCG/ACT inhaler INHALE 2 PUFFS INTO LUNGS EVERY 6 HOURS AS NEEDED FOR WHEEZING OR SHORTNESS OF BREATH  . Probiotic Product (PROBIOTIC DAILY PO) Take 1 tablet by mouth daily.   . Vitamin D, Ergocalciferol, (DRISDOL) 50000 units CAPS  capsule TAKE 1 CAPSULE (50,000 UNITS TOTAL) BY MOUTH EVERY 7 (SEVEN) DAYS.   No current facility-administered medications on file prior to visit.     Review of Systems Per HPI unless specifically indicated in ROS section     Objective:    BP 102/72 (BP Location: Left Arm, Patient Position: Sitting, Cuff Size: Large)   Pulse 77   Temp 98.2 F (36.8 C) (Oral)   Resp 16   Ht '5\' 3"'  (1.6 m)   Wt 219 lb 4 oz (99.5 kg)   SpO2 96%   BMI 38.84 kg/m   Wt Readings from Last 3 Encounters:    10/01/16 219 lb 4 oz (99.5 kg)  10/01/16 224 lb (101.6 kg)  09/24/16 216 lb 8 oz (98.2 kg)    Physical Exam  Constitutional: She appears well-developed and well-nourished. No distress.  HENT:  Mouth/Throat: Oropharynx is clear and moist. No oropharyngeal exudate.  Cardiovascular: Normal rate, regular rhythm, normal heart sounds and intact distal pulses.   No murmur heard. Pulmonary/Chest: Effort normal and breath sounds normal. No respiratory distress. She has no wheezes. She has no rales.  Musculoskeletal: She exhibits edema (tender tr edema with concomitant nonpitting edema).  Skin: Skin is warm and dry. No rash noted.  Psychiatric: She has a normal mood and affect.  Nursing note and vitals reviewed.  Results for orders placed or performed in visit on 09/17/16  CBC with Differential [facility])  Result Value Ref Range   WBC 5.9 3.9 - 10.0 10e3/uL   RBC 4.11 3.70 - 5.32 10e6/uL   HGB 12.5 11.6 - 15.9 g/dL   HCT 38.6 34.8 - 46.6 %   MCV 94 81 - 101 fL   MCH 30.4 26.0 - 34.0 pg   MCHC 32.4 32.0 - 36.0 g/dL   RDW 12.4 11.1 - 15.7 %   Platelets 198 145 - 400 10e3/uL   NEUT# 3.5 1.5 - 6.5 10e3/uL   LYMPH# 1.5 0.9 - 3.3 10e3/uL   MONO# 0.5 0.1 - 0.9 10e3/uL   Eosinophils Absolute 0.3 0.0 - 0.5 10e3/uL   BASO# 0.0 0.0 - 0.2 10e3/uL   NEUT% 60.3 39.6 - 80.0 %   LYMPH% 25.6 14.0 - 48.0 %   MONO% 9.0 0.0 - 13.0 %   EOS% 4.9 0.0 - 7.0 %   BASO% 0.2 0.0 - 2.0 %  Comprehensive metabolic panel  Result Value Ref Range   Sodium 143 136 - 145 mEq/L   Potassium 3.5 3.5 - 5.1 mEq/L   Chloride 105 98 - 109 mEq/L   CO2 28 22 - 29 mEq/L   Glucose 123 70 - 140 mg/dl   BUN 19.5 7.0 - 26.0 mg/dL   Creatinine 0.8 0.6 - 1.1 mg/dL   Total Bilirubin 0.39 0.20 - 1.20 mg/dL   Alkaline Phosphatase 71 40 - 150 U/L   AST 10 5 - 34 U/L   ALT 18 0 - 55 U/L   Total Protein 7.2 6.4 - 8.3 g/dL   Albumin 3.6 3.5 - 5.0 g/dL   Calcium 9.8 8.4 - 10.4 mg/dL   Anion Gap 10 3 - 11 mEq/L   EGFR 81  (L) >90 ml/min/1.73 m2  Lactate dehydrogenase  Result Value Ref Range   LDH 187 125 - 245 U/L      Assessment & Plan:  Over 25 minutes were spent face-to-face with the patient during this encounter and >50% of that time was spent on counseling and coordination of care  Problem  List Items Addressed This Visit    Advanced care planning/counseling discussion    Advanced directives: doesn't want prolonged life support. Unsure who would be HCPOA - likely one of her sisters. Packet previously provided.       Cervical myelopathy with cervical radiculopathy    Doing well after recent cervical surgery 06/2016      Hypertension    Chronic, borderline low today with some unsteadiness endorsed by patient. Will d/c HCTZ component, continue plain losartan along with metolazone and lasix.       Relevant Medications   losartan (COZAAR) 50 MG tablet   Leg pain, bilateral    Presumed edema related. Gabapentin helpful but overly sedating so will decrease to 176m nightly.      [place]   Pedal edema - Primary    Improving on metolazone + lasix. Will d/c HCTZ. Will likely need rpt Cr if metolazone continued. Recent CT showed mildly enlarged inguinal LAD. Appreciate onc assistance          Follow up plan: Return in about 3 months (around 01/01/2017) for follow up visit.  JRia Bush MD

## 2016-10-03 ENCOUNTER — Encounter: Payer: Medicare Other | Admitting: Occupational Therapy

## 2016-10-03 DIAGNOSIS — M1991 Primary osteoarthritis, unspecified site: Secondary | ICD-10-CM | POA: Diagnosis not present

## 2016-10-03 DIAGNOSIS — M4713 Other spondylosis with myelopathy, cervicothoracic region: Secondary | ICD-10-CM | POA: Diagnosis not present

## 2016-10-03 DIAGNOSIS — M40203 Unspecified kyphosis, cervicothoracic region: Secondary | ICD-10-CM | POA: Diagnosis not present

## 2016-10-03 DIAGNOSIS — R609 Edema, unspecified: Secondary | ICD-10-CM | POA: Diagnosis not present

## 2016-10-03 DIAGNOSIS — I10 Essential (primary) hypertension: Secondary | ICD-10-CM | POA: Diagnosis not present

## 2016-10-03 DIAGNOSIS — M419 Scoliosis, unspecified: Secondary | ICD-10-CM | POA: Diagnosis not present

## 2016-10-04 ENCOUNTER — Encounter: Payer: Medicare Other | Admitting: Occupational Therapy

## 2016-10-05 ENCOUNTER — Encounter: Payer: Self-pay | Admitting: Occupational Therapy

## 2016-10-05 ENCOUNTER — Encounter: Payer: Medicare Other | Admitting: Occupational Therapy

## 2016-10-05 DIAGNOSIS — I890 Lymphedema, not elsewhere classified: Secondary | ICD-10-CM

## 2016-10-05 NOTE — Therapy (Signed)
[place] MAIN [facility] SERVICES [address] [place], [place], [address] Phone: [phone]   Fax:  [phone]  Occupational Therapy Discharge Summary  Patient Details  Name: [name] MRN: [id number] Date of Birth: 03/01/1946 No Data Recorded  Encounter Date: 10/05/2016    Past Medical History:  Diagnosis Date  . Anesthesia complication    trouble waking up 2/2 CPAP  . Arthritis   . Asthma in adult   . Cervical spondylosis 2012   Jacelyn Grip, [place])  . Choroidal nevus of left eye 03/22/2016  . Chronic venous insufficiency 2016   severe reflux with painful varicosities sees [facility]  . Complication of anesthesia    difficulty coming out due to sleep apnea per pt  . Difficult intubation    PATIENT DENIES   . DVT (deep venous thrombosis) ([place]) 2011   small, developed after venous ablation  . Family history of adverse reaction to anesthesia    O2 levels drop upon waking   . Hearing loss sensory, bilateral 2013   mod-severe high freq sensorineural ([facility])  . History of kidney stones 1980s  . History of radiation therapy 07/19/11-08/25/2011   RIGHT AXILLARY REGION/METASTATIC  . Hypertension   . Kidney stones   . Melanoma ([place]) 06/29/10   MALIGNANT MELANOMA R SHOULDER/SUPRASCAPULAR BACK s/p interferon chemo and XRT  . Neuromuscular disorder ([facility])    muscle spasms  . OSA (obstructive sleep apnea)    on CPAP  . Pneumonia    2001  . RLS (restless legs syndrome)   . Seasonal allergies   . Tinnitus   . Unspecified vitamin D deficiency 03/18/2013    Past Surgical History:  Procedure Laterality Date  . ABI  2016   WNL  . ANTERIOR CERVICAL DECOMP/DISCECTOMY FUSION N/A 08/22/2015   ANTERIOR CERVICAL DECOMPRESSION FUSION C3/4 - interbody graft and anterior plate; Kristeen Miss, MD  . ANTERIOR CERVICAL DECOMP/DISCECTOMY FUSION N/A 07/02/2016   Procedure: Cervical four- five Cervical five- six Cervical six- seven Anterior cervical  decompression/diskectomy/fusion;  Surgeon: Kristeen Miss, MD;  Location: [facility];  Service: Neurosurgery;  Laterality: N/A;  C4-5 C5-6 C6-7 Anterior cervical decompression/diskectomy/fusion  . BREAST BIOPSY Left 2013   BENIGN  . BREAST SURGERY Right 1990   BX   . CARDIOVASCULAR STRESS TEST  2010   normal stress test, EF 66%  . COLONOSCOPY  03/2005   benign polyp, int hem, rpt 5 yrs ([place], [place])  . DEEP AXILLARY SENTINEL NODE BIOPSY / EXCISION  2012   RIGHT  . DEXA  06/2016   WNL  . DILATION AND CURETTAGE OF UTERUS  2010  . ENDOVENOUS ABLATION SAPHENOUS VEIN W/ LASER Left 2010   [facility]  . ENDOVENOUS ABLATION SAPHENOUS VEIN W/ LASER Left 2016   accessory branch [facility] Kellie Simmering)  . ESOPHAGOGASTRODUODENOSCOPY  2006   mod chronic carditis, no H pylori ([place], [place])  . [place]   left  . LUMBAR LAMINECTOMY  2001   L4/5  . MELANOMA EXCISION  2011   Right shoulder, with sentinel lymph node biopsy  . PORT-A-CATH REMOVAL  12/05/2011   Procedure: REMOVAL PORT-A-CATH;  Surgeon: Rolm Bookbinder, MD;  Location: [place];  Service: General;  Laterality: N/A;  left port removal  . PORTACATH PLACEMENT     left subclavian    There were no vitals filed for this visit.  OT Long Term Goals - 10/05/16 0935      OT LONG TERM GOAL #1   Title Lymphedema (LE) management/ self-care: Pt able to apply multi layered, gradient compression wraps with MAX caregiver assistance using proper techniques within 2 weeks to achieve optimal limb volume reduction.   Baseline dependent   Time 2   Period Weeks   Status Not Met     OT LONG TERM GOAL #2   Title Lymphedema (LE) management/ self-care:  Pt to achieve at least 10% LLE limb volume reductions bilaterally during Intensive CDT to limit LE progression, decrease infection and falls risk, to reduce pain/, and to improve safe ambulation and functional mobility.    Baseline dependent   Time 12   Period Weeks   Status Not Met     OT LONG TERM GOAL #3   Title Lymphedema (LE) management/ self-care:  Pt >/= 85 % compliant with all daily, LE self-care protocols for home program w/ needed level of caregiver assistance , including simple self-manual lymphatic drainage (MLD), skin care, lymphatic pumping the ex, skin care, and donning/ doffing compression wraps and garments o limit LE progression and further functional decline.     Baseline dependent   Time 12   Period Weeks   Status Not Met     OT LONG TERM GOAL #5   Title Lymphedema (LE) management/ self-care:  Pt to tolerate daily compression wraps, garments and devices in keeping w/ prescribed wear regime within 1 week of issue date to progress and retain clinical and functional gains and to limit LE progression.   Baseline dependent   Period Weeks   Status Not Met     OT LONG TERM GOAL #6   Title Lymphedema (LE) self-care:  During Management Phase CDT Pt to sustain limb volume reductions achieved during Intensive Phase CDT within 5% utilizing LE self-care protocols, appropriate compression garments/ devices, and needed level of caregiver assistance.   Baseline dependent   Time 6   Period Months   Status Not Met               Plan - 10/05/16 0934    Clinical Impression Statement Pt is DC from Occupational Therapy for lymphedema care. She was evaluated , but did not return for treatment.      Patient will benefit from skilled therapeutic intervention in order to improve the following deficits and impairments:     Visit Diagnosis: Lymphedema, not elsewhere classified    Problem List Patient Active Problem List   Diagnosis Date Noted  . Leg pain, bilateral 08/28/2016  . Pedal edema 08/13/2016  . 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 Kyphosis of cervical region 07/02/2016  . Intertrigo 03/22/2016  . Cervical myelopathy with cervical radiculopathy 08/18/2015  . Varicose veins of left lower  extremity with complications 04/59/9774  . Advanced care planning/counseling discussion 09/22/2014  . Medicare annual wellness visit, subsequent 05/15/2013  . Allergic rhinitis 04/03/2013  . Unspecified vitamin D deficiency 03/18/2013  . Hypertension   . Asthma in adult   . Chronic venous insufficiency   . Arthritis   . OSA (obstructive sleep apnea)   . RLS (restless legs syndrome)   . Melanoma ([place])     Andrey Spearman, MS, OTR/L, [facility] 10/05/16 9:37 AM   [place] MAIN [facility] SERVICES [address] [place], [place], [address] Phone: [phone]   Fax:  [phone]  Name: [name] MRN: [id number] Date of Birth: January 05, 1946

## 2016-10-06 DIAGNOSIS — M40203 Unspecified kyphosis, cervicothoracic region: Secondary | ICD-10-CM | POA: Diagnosis not present

## 2016-10-06 DIAGNOSIS — M419 Scoliosis, unspecified: Secondary | ICD-10-CM | POA: Diagnosis not present

## 2016-10-06 DIAGNOSIS — R609 Edema, unspecified: Secondary | ICD-10-CM | POA: Diagnosis not present

## 2016-10-06 DIAGNOSIS — M4713 Other spondylosis with myelopathy, cervicothoracic region: Secondary | ICD-10-CM | POA: Diagnosis not present

## 2016-10-06 DIAGNOSIS — M1991 Primary osteoarthritis, unspecified site: Secondary | ICD-10-CM | POA: Diagnosis not present

## 2016-10-06 DIAGNOSIS — I10 Essential (primary) hypertension: Secondary | ICD-10-CM | POA: Diagnosis not present

## 2016-10-08 ENCOUNTER — Encounter: Payer: Medicare Other | Admitting: Occupational Therapy

## 2016-10-09 ENCOUNTER — Other Ambulatory Visit (HOSPITAL_BASED_OUTPATIENT_CLINIC_OR_DEPARTMENT_OTHER): Payer: Medicare Other

## 2016-10-09 ENCOUNTER — Telehealth: Payer: Self-pay | Admitting: *Deleted

## 2016-10-09 ENCOUNTER — Ambulatory Visit (HOSPITAL_BASED_OUTPATIENT_CLINIC_OR_DEPARTMENT_OTHER): Payer: Medicare Other | Admitting: Hematology & Oncology

## 2016-10-09 VITALS — BP 122/74 | HR 64 | Temp 98.0°F | Wt 220.8 lb

## 2016-10-09 DIAGNOSIS — R600 Localized edema: Secondary | ICD-10-CM

## 2016-10-09 DIAGNOSIS — I119 Hypertensive heart disease without heart failure: Secondary | ICD-10-CM

## 2016-10-09 DIAGNOSIS — R599 Enlarged lymph nodes, unspecified: Secondary | ICD-10-CM

## 2016-10-09 DIAGNOSIS — C434 Malignant melanoma of scalp and neck: Secondary | ICD-10-CM

## 2016-10-09 DIAGNOSIS — I43 Cardiomyopathy in diseases classified elsewhere: Secondary | ICD-10-CM

## 2016-10-09 DIAGNOSIS — C4361 Malignant melanoma of right upper limb, including shoulder: Secondary | ICD-10-CM | POA: Diagnosis not present

## 2016-10-09 DIAGNOSIS — R609 Edema, unspecified: Secondary | ICD-10-CM | POA: Diagnosis not present

## 2016-10-09 DIAGNOSIS — C4360 Malignant melanoma of unspecified upper limb, including shoulder: Secondary | ICD-10-CM

## 2016-10-09 LAB — CBC WITH DIFFERENTIAL (CANCER CENTER ONLY)
BASO#: 0 10*3/uL (ref 0.0–0.2)
BASO%: 0.2 % (ref 0.0–2.0)
EOS%: 3.6 % (ref 0.0–7.0)
Eosinophils Absolute: 0.2 10*3/uL (ref 0.0–0.5)
HCT: 39.1 % (ref 34.8–46.6)
HGB: 12.9 g/dL (ref 11.6–15.9)
LYMPH#: 1.6 10*3/uL (ref 0.9–3.3)
LYMPH%: 25.8 % (ref 14.0–48.0)
MCH: 30.4 pg (ref 26.0–34.0)
MCHC: 33 g/dL (ref 32.0–36.0)
MCV: 92 fL (ref 81–101)
MONO#: 0.5 10*3/uL (ref 0.1–0.9)
MONO%: 7.5 % (ref 0.0–13.0)
NEUT#: 3.9 10*3/uL (ref 1.5–6.5)
NEUT%: 62.9 % (ref 39.6–80.0)
Platelets: 199 10*3/uL (ref 145–400)
RBC: 4.25 10*6/uL (ref 3.70–5.32)
RDW: 12.5 % (ref 11.1–15.7)
WBC: 6.1 10*3/uL (ref 3.9–10.0)

## 2016-10-09 LAB — CMP (CANCER CENTER ONLY)
ALT(SGPT): 20 U/L (ref 10–47)
AST: 20 U/L (ref 11–38)
Albumin: 3.5 g/dL (ref 3.3–5.5)
Alkaline Phosphatase: 62 U/L (ref 26–84)
BUN, Bld: 20 mg/dL (ref 7–22)
CO2: 30 mEq/L (ref 18–33)
Calcium: 10.3 mg/dL (ref 8.0–10.3)
Chloride: 99 mEq/L (ref 98–108)
Creat: 0.8 mg/dl (ref 0.6–1.2)
Glucose, Bld: 93 mg/dL (ref 73–118)
Potassium: 3.2 mEq/L — ABNORMAL LOW (ref 3.3–4.7)
Sodium: 143 mEq/L (ref 128–145)
Total Bilirubin: 0.4 mg/dl (ref 0.20–1.60)
Total Protein: 7.2 g/dL (ref 6.4–8.1)

## 2016-10-09 LAB — LACTATE DEHYDROGENASE: LDH: 216 U/L (ref 125–245)

## 2016-10-09 NOTE — Telephone Encounter (Signed)
-----   Message from [name], MD sent at 10/09/2016  1:37 PM EST ----- Call - the tumor is much smaller!!!  It wil continue to shrink!!  [place] job!!  pete

## 2016-10-09 NOTE — Progress Notes (Signed)
Hematology and Oncology Follow Up Visit  [name] [id number] 07/31/1946 70 y.o. 10/09/2016   Principle Diagnosis:  Stage IIIC (T3b N3 M0) melanoma of the right shoulder.  Current Therapy:    Observation     Interim History:  Ms.  [name] is back for followup. She is doing better. The diuretic has helped.  We did go ahead and do a CT scan of her abdomen and pelvis. Of course, the radiologist was not sure as to whether or not the mild inguinal adenopathy was a problem. I think the right side measured 1.8. The left side measured 1.7 cm.  We did go ahead and do a echocardiogram. She has slightly decreased cardiac function. Her left ventricular ejection fraction is 50-55%.  I think she sees her lung doctor in a week or so.  She saw her family doctor last week.   Her weight has gone down with the diuretics. Her legs are clearly gotten better.  Otherwise, she has been doing pretty well.   Overall, her performance status is ECOG 2.  Medications:  Current Outpatient Prescriptions:  .  acetaminophen (TYLENOL ARTHRITIS PAIN) 650 MG CR tablet, Take 1,300 mg by mouth every 8 (eight) hours as needed for pain. , Disp: , Rfl:  .  aspirin EC 81 MG tablet, Take 81 mg by mouth at bedtime. Melanoma prevention, Disp: , Rfl:  .  Fexofenadine HCl (ALLERGY 24-HR PO), Take by mouth., Disp: , Rfl:  .  furosemide (LASIX) 40 MG tablet, Take 1 tablet (40 mg total) by mouth daily., Disp: 30 tablet, Rfl: 3 .  gabapentin (NEURONTIN) 100 MG capsule, Take 1 capsule (100 mg total) by mouth at bedtime., Disp: 30 capsule, Rfl: 3 .  KLOR-CON 10 10 MEQ tablet, TAKE 2 TABLETS BY MOUTH DAILY., Disp: 180 tablet, Rfl: 3 .  losartan (COZAAR) 50 MG tablet, Take 1 tablet (50 mg total) by mouth daily., Disp: 30 tablet, Rfl: 3 .  metolazone (ZAROXOLYN) 2.5 MG tablet, Take 1 tablet daily 1 hour prior to furosemide., Disp: 30 tablet, Rfl: 4 .  montelukast (SINGULAIR) 10 MG tablet, TAKE 1 TABLET BY MOUTH DAILY, Disp: 30  tablet, Rfl: 6 .  Multiple Vitamin (MULTIVITAMIN WITH MINERALS) TABS tablet, Take 1 tablet by mouth daily., Disp: , Rfl:  .  Polyvinyl Alcohol-Povidone (TEARS PLUS OP), Place 1 drop into both eyes daily as needed (dry eyes/ redness/ burning)., Disp: , Rfl:  .  PRESCRIPTION MEDICATION, CPAP, Disp: , Rfl:  .  PROAIR HFA 108 (90 Base) MCG/ACT inhaler, INHALE 2 PUFFS INTO LUNGS EVERY 6 HOURS AS NEEDED FOR WHEEZING OR SHORTNESS OF BREATH, Disp: 8.5 Inhaler, Rfl: 3 .  Probiotic Product (PROBIOTIC DAILY PO), Take 1 tablet by mouth daily. , Disp: , Rfl:  .  Vitamin D, Ergocalciferol, (DRISDOL) 50000 units CAPS capsule, TAKE 1 CAPSULE (50,000 UNITS TOTAL) BY MOUTH EVERY 7 (SEVEN) DAYS., Disp: 4 capsule, Rfl: 3  Allergies:  Allergies  Allergen Reactions  . Sulfa Antibiotics Itching and Swelling    Facial swelling  . Latex Other (See Comments)    Blisters ONLY HAD REACTION TO TAPE  (??? ONLY ADHESIVE ALLERGY)  . Tape Other (See Comments)    Caused blisters - must use paper tape - same reaction from [employer]    Past Medical History, Surgical history, Social history, and Family History were reviewed and updated.  Review of Systems: As above  Physical Exam:  weight is 220 lb 12.8 oz (100.2 kg). Her oral temperature is 98  F (36.7 C). Her blood pressure is 122/74 and her pulse is 64.   [name] and well-nourished white female. Head and neck exam is no ocular or oral lesions. She has a healing cervical limits be scar on the anterior neck. There are no palpable cervical or supraclavicular lymph nodes. Lungs are clear. Cardiac exam regular rate and rhythm with no murmurs rubs or bruits. Abdomen soft. Has good bowel sounds. Is no fluid wave. There is a palpable hepato- splenomegaly. Exam shows a wide local excision scar in the posterior right shoulder region. This well-healed. No tenderness over the spine ribs or hips. Axillary exam shows no bilateral axillary adenopathy. Extremities shows 2+ edema  in her lower legs bilaterally. She may have some slight stasis dermatitis changes. She has decent pulses in her distal extremities. There is mild lymphedema noted in the right arm. Skin exam shows no suspicious skin lesions.  Lab Results  Component Value Date   WBC 6.1 10/09/2016   HGB 12.9 10/09/2016   HCT 39.1 10/09/2016   MCV 92 10/09/2016   PLT 199 10/09/2016     Chemistry      Component Value Date/Time   NA 143 10/09/2016 1145   NA 143 09/17/2016 1018   K 3.2 (L) 10/09/2016 1145   K 3.5 09/17/2016 1018   CL 99 10/09/2016 1145   CO2 30 10/09/2016 1145   CO2 28 09/17/2016 1018   BUN 20 10/09/2016 1145   BUN 19.5 09/17/2016 1018   CREATININE 0.8 10/09/2016 1145   CREATININE 0.8 09/17/2016 1018      Component Value Date/Time   CALCIUM 10.3 10/09/2016 1145   CALCIUM 9.8 09/17/2016 1018   ALKPHOS 62 10/09/2016 1145   ALKPHOS 71 09/17/2016 1018   AST 20 10/09/2016 1145   AST 10 09/17/2016 1018   ALT 20 10/09/2016 1145   ALT 18 09/17/2016 1018   BILITOT 0.40 10/09/2016 1145   BILITOT 0.39 09/17/2016 1018         Impression and Plan: [name] is a 70 year old white female with stage IIIc melanoma of the right shoulder. She did undergo resection. She had 8 positive lymph nodes. She completed adjuvant interferon therapy in December of 2012. She had radiation therapy to the right axilla.  From my point of view, we will have to get another CT scan on her. I get 1 in 3 months to assess the inguinal lymph nodes. If there is any growth, then we will have to go ahead and get a PET scan.  I will defer now to her family doctor for the management of her peripheral edema.  I just don't see that recurrent melanoma is an issue. Hopefully, it will not be but she is at significant risk for recurrence because of the 8 lymph nodes that were removed that contain melanoma.  Of note, it is now been 5 years since she completed adjuvant interferon. She then had radiation to the right  axilla. I'm just surprised that her right arm does not have a lot of lymphedema. [name], MD 12/5/201712:40 PM

## 2016-10-10 ENCOUNTER — Encounter: Payer: Medicare Other | Admitting: Occupational Therapy

## 2016-10-10 DIAGNOSIS — M1991 Primary osteoarthritis, unspecified site: Secondary | ICD-10-CM | POA: Diagnosis not present

## 2016-10-10 DIAGNOSIS — M4713 Other spondylosis with myelopathy, cervicothoracic region: Secondary | ICD-10-CM | POA: Diagnosis not present

## 2016-10-10 DIAGNOSIS — R609 Edema, unspecified: Secondary | ICD-10-CM | POA: Diagnosis not present

## 2016-10-10 DIAGNOSIS — M40203 Unspecified kyphosis, cervicothoracic region: Secondary | ICD-10-CM | POA: Diagnosis not present

## 2016-10-10 DIAGNOSIS — I10 Essential (primary) hypertension: Secondary | ICD-10-CM | POA: Diagnosis not present

## 2016-10-10 DIAGNOSIS — M419 Scoliosis, unspecified: Secondary | ICD-10-CM | POA: Diagnosis not present

## 2016-10-11 ENCOUNTER — Encounter: Payer: Medicare Other | Admitting: Occupational Therapy

## 2016-10-12 ENCOUNTER — Encounter: Payer: Medicare Other | Admitting: Occupational Therapy

## 2016-10-15 ENCOUNTER — Encounter: Payer: Medicare Other | Admitting: Occupational Therapy

## 2016-10-15 DIAGNOSIS — R609 Edema, unspecified: Secondary | ICD-10-CM | POA: Diagnosis not present

## 2016-10-15 DIAGNOSIS — M40203 Unspecified kyphosis, cervicothoracic region: Secondary | ICD-10-CM | POA: Diagnosis not present

## 2016-10-15 DIAGNOSIS — M419 Scoliosis, unspecified: Secondary | ICD-10-CM | POA: Diagnosis not present

## 2016-10-15 DIAGNOSIS — I10 Essential (primary) hypertension: Secondary | ICD-10-CM | POA: Diagnosis not present

## 2016-10-15 DIAGNOSIS — M1991 Primary osteoarthritis, unspecified site: Secondary | ICD-10-CM | POA: Diagnosis not present

## 2016-10-15 DIAGNOSIS — M4713 Other spondylosis with myelopathy, cervicothoracic region: Secondary | ICD-10-CM | POA: Diagnosis not present

## 2016-10-17 ENCOUNTER — Encounter: Payer: Medicare Other | Admitting: Occupational Therapy

## 2016-10-17 DIAGNOSIS — M40203 Unspecified kyphosis, cervicothoracic region: Secondary | ICD-10-CM | POA: Diagnosis not present

## 2016-10-17 DIAGNOSIS — I10 Essential (primary) hypertension: Secondary | ICD-10-CM | POA: Diagnosis not present

## 2016-10-17 DIAGNOSIS — R609 Edema, unspecified: Secondary | ICD-10-CM | POA: Diagnosis not present

## 2016-10-17 DIAGNOSIS — M419 Scoliosis, unspecified: Secondary | ICD-10-CM | POA: Diagnosis not present

## 2016-10-17 DIAGNOSIS — M4713 Other spondylosis with myelopathy, cervicothoracic region: Secondary | ICD-10-CM | POA: Diagnosis not present

## 2016-10-17 DIAGNOSIS — M1991 Primary osteoarthritis, unspecified site: Secondary | ICD-10-CM | POA: Diagnosis not present

## 2016-10-19 ENCOUNTER — Encounter: Payer: Medicare Other | Admitting: Occupational Therapy

## 2016-10-22 ENCOUNTER — Encounter: Payer: Medicare Other | Admitting: Occupational Therapy

## 2016-10-24 ENCOUNTER — Encounter: Payer: Medicare Other | Admitting: Occupational Therapy

## 2016-10-24 DIAGNOSIS — M40203 Unspecified kyphosis, cervicothoracic region: Secondary | ICD-10-CM | POA: Diagnosis not present

## 2016-10-24 DIAGNOSIS — M4713 Other spondylosis with myelopathy, cervicothoracic region: Secondary | ICD-10-CM | POA: Diagnosis not present

## 2016-10-24 DIAGNOSIS — I10 Essential (primary) hypertension: Secondary | ICD-10-CM | POA: Diagnosis not present

## 2016-10-24 DIAGNOSIS — M1991 Primary osteoarthritis, unspecified site: Secondary | ICD-10-CM | POA: Diagnosis not present

## 2016-10-24 DIAGNOSIS — M419 Scoliosis, unspecified: Secondary | ICD-10-CM | POA: Diagnosis not present

## 2016-10-24 DIAGNOSIS — R609 Edema, unspecified: Secondary | ICD-10-CM | POA: Diagnosis not present

## 2016-10-26 ENCOUNTER — Encounter: Payer: Medicare Other | Admitting: Occupational Therapy

## 2016-10-31 ENCOUNTER — Telehealth: Payer: Self-pay

## 2016-10-31 ENCOUNTER — Encounter: Payer: Medicare Other | Admitting: Occupational Therapy

## 2016-10-31 DIAGNOSIS — M4713 Other spondylosis with myelopathy, cervicothoracic region: Secondary | ICD-10-CM | POA: Diagnosis not present

## 2016-10-31 DIAGNOSIS — M419 Scoliosis, unspecified: Secondary | ICD-10-CM | POA: Diagnosis not present

## 2016-10-31 DIAGNOSIS — M40203 Unspecified kyphosis, cervicothoracic region: Secondary | ICD-10-CM | POA: Diagnosis not present

## 2016-10-31 DIAGNOSIS — M1991 Primary osteoarthritis, unspecified site: Secondary | ICD-10-CM | POA: Diagnosis not present

## 2016-10-31 DIAGNOSIS — R609 Edema, unspecified: Secondary | ICD-10-CM | POA: Diagnosis not present

## 2016-10-31 DIAGNOSIS — I10 Essential (primary) hypertension: Secondary | ICD-10-CM | POA: Diagnosis not present

## 2016-10-31 NOTE — Telephone Encounter (Signed)
This Probation officer spoke with patient and she describes a dime size ulceration to left inner lower extremity just above ankle.  She has been treating with saline, abx ointment and covering with a bandage.  Area is not healing and she is concerned about it.  Denies malodorous drainage but does note redness and warmth.  Patient has hx of venous insufficiency and lymph edema and states that redness and warmth in that area is not abnormal for her and does not appear more than usual but the area is tender.    Patient has appointment with Dr. Danise Mina next Tuesday; however, if cellulitis is setting in, she will need more immediate attention.  Brandy, home health nurse, is coming to check her this evening and provide wound care.  I have requested that [facility] call me tomorrow with a further detailed description of the wound and then we can discuss with Dr. Darnell Level treatment recommendations.  Patient verbalizes understanding.

## 2016-10-31 NOTE — Telephone Encounter (Signed)
Message left on Triage Line: Pt has an appt 11-06-16 for a leg ulcer. She was asking what she needs to do with it before that visit.  I tried to call her to get more information about the area. I left a message for her to call back and ask for Kim. Advised we needed more information like how long she has had it, is it an open wound or like a boil, etc.

## 2016-10-31 NOTE — Telephone Encounter (Signed)
Noted. If no cellulitis, would suggest unna boot placement through weekend and we can reassess on Tuesday.

## 2016-11-01 ENCOUNTER — Ambulatory Visit (INDEPENDENT_AMBULATORY_CARE_PROVIDER_SITE_OTHER): Payer: Medicare Other | Admitting: Family Medicine

## 2016-11-01 ENCOUNTER — Encounter: Payer: Self-pay | Admitting: Family Medicine

## 2016-11-01 VITALS — BP 110/70 | HR 81 | Temp 98.1°F | Wt 219.5 lb

## 2016-11-01 DIAGNOSIS — L97929 Non-pressure chronic ulcer of unspecified part of left lower leg with unspecified severity: Secondary | ICD-10-CM

## 2016-11-01 DIAGNOSIS — I83229 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 of unspecified site and inflammation: Secondary | ICD-10-CM | POA: Diagnosis not present

## 2016-11-01 DIAGNOSIS — M79604 Pain in right leg: Secondary | ICD-10-CM | POA: Diagnosis not present

## 2016-11-01 DIAGNOSIS — M79605 Pain in left leg: Secondary | ICD-10-CM | POA: Diagnosis not present

## 2016-11-01 DIAGNOSIS — I872 Venous insufficiency (chronic) (peripheral): Secondary | ICD-10-CM

## 2016-11-01 NOTE — Assessment & Plan Note (Addendum)
New venous insufficiency ulcer - pt unable to tolerate unna boot. Will treat with QOD dressing change with vaseline or abx ointment. RTC 5 days recheck. Pt agrees with plan.

## 2016-11-01 NOTE — Progress Notes (Signed)
BP 110/70   Pulse 81   Temp 98.1 F (36.7 C) (Oral)   Wt 219 lb 8 oz (99.6 kg)   SpO2 94%   BMI 38.88 kg/m    CC: check leg Subjective:    Patient ID: [name], female    DOB: 12/18/1945, 70 y.o.   MRN: [id number]  HPI: [name] is a 70 y.o. female presenting on 11/01/2016 for Leg ulceration, RO cellulitis   See recent phone note. We were contacted yesterday by patient with concerns for recurrent L inner lower leg dime sized ulceration with some surrounding erythema, not improved with saline, antibiotic ointment, bandage. Home health evaluated area last night - placed in unna boot. Pt did not tolerate this and removed it mid night. Patient noticed small ulcer develop over the last 1-2 weeks. Worsening burning pain at ulcer. Lower legs remain very sensitive to touch.  Significant improvement in pedal edema with home lymphedema therapy of moisturizing cream and leg massaging, as well as increased lasix with metolazone.   H/o L endovenous ablation of saphenous vein 2016.  H/o lymphedema, venous insufficiency.   Relevant past medical, surgical, family and social history reviewed and updated as indicated. Interim medical history since our last visit reviewed. Allergies and medications reviewed and updated. Current Outpatient Prescriptions on File Prior to Visit  Medication Sig  . acetaminophen (TYLENOL ARTHRITIS PAIN) 650 MG CR tablet Take 1,300 mg by mouth every 8 (eight) hours as needed for pain.   Marland Kitchen aspirin EC 81 MG tablet Take 81 mg by mouth at bedtime. Melanoma prevention  . Fexofenadine HCl (ALLERGY 24-HR PO) Take by mouth.  . furosemide (LASIX) 40 MG tablet Take 1 tablet (40 mg total) by mouth daily.  Marland Kitchen gabapentin (NEURONTIN) 100 MG capsule Take 1 capsule (100 mg total) by mouth at bedtime.  Marland Kitchen KLOR-CON 10 10 MEQ tablet TAKE 2 TABLETS BY MOUTH DAILY.  Marland Kitchen losartan (COZAAR) 50 MG tablet Take 1 tablet (50 mg total) by mouth daily.  . metolazone (ZAROXOLYN) 2.5 MG tablet  Take 1 tablet daily 1 hour prior to furosemide.  . montelukast (SINGULAIR) 10 MG tablet TAKE 1 TABLET BY MOUTH DAILY  . Multiple Vitamin (MULTIVITAMIN WITH MINERALS) TABS tablet Take 1 tablet by mouth daily.  . Polyvinyl Alcohol-Povidone (TEARS PLUS OP) Place 1 drop into both eyes daily as needed (dry eyes/ redness/ burning).  Marland Kitchen PRESCRIPTION MEDICATION CPAP  . PROAIR HFA 108 (90 Base) MCG/ACT inhaler INHALE 2 PUFFS INTO LUNGS EVERY 6 HOURS AS NEEDED FOR WHEEZING OR SHORTNESS OF BREATH  . Probiotic Product (PROBIOTIC DAILY PO) Take 1 tablet by mouth daily.   . Vitamin D, Ergocalciferol, (DRISDOL) 50000 units CAPS capsule TAKE 1 CAPSULE (50,000 UNITS TOTAL) BY MOUTH EVERY 7 (SEVEN) DAYS.   No current facility-administered medications on file prior to visit.     Review of Systems Per HPI unless specifically indicated in ROS section     Objective:    BP 110/70   Pulse 81   Temp 98.1 F (36.7 C) (Oral)   Wt 219 lb 8 oz (99.6 kg)   SpO2 94%   BMI 38.88 kg/m   Wt Readings from Last 3 Encounters:  11/01/16 219 lb 8 oz (99.6 kg)  10/09/16 220 lb 12.8 oz (100.2 kg)  10/01/16 219 lb 4 oz (99.5 kg)    Physical Exam  Constitutional: She appears well-developed and well-nourished. No distress.  Musculoskeletal: She exhibits edema (1+ pedal bilateral).  Small ulcer left  medial lower leg without surrounding erythema or discharge. Slightly weepy. Sensitive lower leg to touch  Nursing note and vitals reviewed.  Wound dressed with triple abx ointment and large bandaid.    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 Will treat with QOD dressing change with vaseline or abx ointment. RTC 5 days recheck. Pt agrees with plan.          Follow up plan: No Follow-up  on file.  [name], MD

## 2016-11-01 NOTE — Progress Notes (Signed)
Pre visit review using our clinic review tool, if applicable. No additional management support is needed unless otherwise documented below in the visit note. 

## 2016-11-01 NOTE — Patient Instructions (Signed)
Likely venous ulcer.  No signs of infection today. I recommend dressing changes every other day with either vaseline or antibiotic ointment.  Let us know if not healing over the next week.

## 2016-11-02 ENCOUNTER — Encounter: Payer: Medicare Other | Admitting: Occupational Therapy

## 2016-11-03 DIAGNOSIS — M40203 Unspecified kyphosis, cervicothoracic region: Secondary | ICD-10-CM | POA: Diagnosis not present

## 2016-11-03 DIAGNOSIS — I872 Venous insufficiency (chronic) (peripheral): Secondary | ICD-10-CM | POA: Diagnosis not present

## 2016-11-03 DIAGNOSIS — M419 Scoliosis, unspecified: Secondary | ICD-10-CM | POA: Diagnosis not present

## 2016-11-03 DIAGNOSIS — M4713 Other spondylosis with myelopathy, cervicothoracic region: Secondary | ICD-10-CM | POA: Diagnosis not present

## 2016-11-03 DIAGNOSIS — I890 Lymphedema, not elsewhere classified: Secondary | ICD-10-CM | POA: Diagnosis not present

## 2016-11-03 DIAGNOSIS — I10 Essential (primary) hypertension: Secondary | ICD-10-CM | POA: Diagnosis not present

## 2016-11-06 ENCOUNTER — Ambulatory Visit (INDEPENDENT_AMBULATORY_CARE_PROVIDER_SITE_OTHER): Payer: Medicare Other | Admitting: Family Medicine

## 2016-11-06 ENCOUNTER — Encounter: Payer: Self-pay | Admitting: Family Medicine

## 2016-11-06 VITALS — BP 108/68 | HR 69 | Temp 98.3°F | Wt 218.0 lb

## 2016-11-06 DIAGNOSIS — I83229 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 of unspecified site and inflammation: Secondary | ICD-10-CM

## 2016-11-06 DIAGNOSIS — L97929 Non-pressure chronic ulcer of unspecified part of left lower leg with unspecified severity: Secondary | ICD-10-CM

## 2016-11-06 NOTE — Patient Instructions (Signed)
Dressing change today - continue every day to every other day dressing changes. We will refer you to wound clinic - see our referral coordinators on your way out today.

## 2016-11-06 NOTE — Progress Notes (Signed)
Pre visit review using our clinic review tool, if applicable. No additional management support is needed unless otherwise documented below in the visit note. 

## 2016-11-06 NOTE — Assessment & Plan Note (Signed)
Persistent, no improvement with QOD dressing changes with triple abx ointment, now 2nd ulcer has developed. Will refer to wound clinic to assist.

## 2016-11-06 NOTE — Progress Notes (Signed)
BP 108/68   Pulse 69   Temp 98.3 F (36.8 C) (Oral)   Wt 218 lb (98.9 kg)   BMI 38.62 kg/m    CC: check L leg sore Subjective:    Patient ID: [name], female    DOB: [birth year], 71 y.o.   MRN: [id number]  HPI: [name] is a 71 y.o. female presenting on 11/06/2016 for sore on left leg (not healing)   See prior note for details - seen here last week with poorly healing L leg venous ulcer. Pt was unable to tolerate unna boot. We treated with less frequent dressing changes. Tolerating well but not improving as desired.   H/o L endovenous ablation of saphenous vein 2016.  H/o lymphedema, venous insufficiency.   Relevant past medical, surgical, family and social history reviewed and updated as indicated. Interim medical history since our last visit reviewed. Allergies and medications reviewed and updated. Current Outpatient Prescriptions on File Prior to Visit  Medication Sig  . acetaminophen (TYLENOL ARTHRITIS PAIN) 650 MG CR tablet Take 1,300 mg by mouth every 8 (eight) hours as needed for pain.   Marland Kitchen aspirin EC 81 MG tablet Take 81 mg by mouth at bedtime. Melanoma prevention  . Fexofenadine HCl (ALLERGY 24-HR PO) Take by mouth.  . furosemide (LASIX) 40 MG tablet Take 1 tablet (40 mg total) by mouth daily.  Marland Kitchen gabapentin (NEURONTIN) 100 MG capsule Take 1 capsule (100 mg total) by mouth at bedtime.  Marland Kitchen KLOR-CON 10 10 MEQ tablet TAKE 2 TABLETS BY MOUTH DAILY.  Marland Kitchen losartan (COZAAR) 50 MG tablet Take 1 tablet (50 mg total) by mouth daily.  . metolazone (ZAROXOLYN) 2.5 MG tablet Take 1 tablet daily 1 hour prior to furosemide.  . montelukast (SINGULAIR) 10 MG tablet TAKE 1 TABLET BY MOUTH DAILY  . Multiple Vitamin (MULTIVITAMIN WITH MINERALS) TABS tablet Take 1 tablet by mouth daily.  . Polyvinyl Alcohol-Povidone (TEARS PLUS OP) Place 1 drop into both eyes daily as needed (dry eyes/ redness/ burning).  Marland Kitchen PRESCRIPTION MEDICATION CPAP  . PROAIR HFA 108 (90 Base) MCG/ACT inhaler  INHALE 2 PUFFS INTO LUNGS EVERY 6 HOURS AS NEEDED FOR WHEEZING OR SHORTNESS OF BREATH  . Probiotic Product (PROBIOTIC DAILY PO) Take 1 tablet by mouth daily.   . Vitamin D, Ergocalciferol, (DRISDOL) 50000 units CAPS capsule TAKE 1 CAPSULE (50,000 UNITS TOTAL) BY MOUTH EVERY 7 (SEVEN) DAYS.   No current facility-administered medications on file prior to visit.    Past Medical History:  Diagnosis Date  . Anesthesia complication    trouble waking up 2/2 CPAP  . Arthritis   . Asthma in adult   . Cervical spondylosis 2012   [facility], [place])  . Choroidal nevus of left eye 03/22/2016  . Chronic venous insufficiency 2016   severe reflux with painful varicosities sees [facility]  . Complication of anesthesia    difficulty coming out due to sleep apnea per pt  . Difficult intubation    PATIENT DENIES   . DVT (deep venous thrombosis) ([place]) 2011   small, developed after venous ablation  . Family history of adverse reaction to anesthesia    O2 levels drop upon waking   . Hearing loss sensory, bilateral 2013   mod-severe high freq sensorineural ([facility])  . History of kidney stones 1980s  . History of radiation therapy 07/19/11-08/25/2011   RIGHT AXILLARY REGION/METASTATIC  . Hypertension   . Kidney stones   . Melanoma ([place]) 06/29/10   MALIGNANT MELANOMA  R SHOULDER/SUPRASCAPULAR BACK s/p interferon chemo and XRT  . Neuromuscular disorder ([facility])    muscle spasms  . OSA (obstructive sleep apnea)    on CPAP  . Pneumonia    2001  . RLS (restless legs syndrome)   . Seasonal allergies   . Tinnitus   . Unspecified vitamin D deficiency 03/18/2013    Past Surgical History:  Procedure Laterality Date  . ABI  2016   WNL  . ANTERIOR CERVICAL DECOMP/DISCECTOMY FUSION N/A 08/22/2015   ANTERIOR CERVICAL DECOMPRESSION FUSION C3/4 - interbody graft and anterior plate; Kristeen Miss, MD  . ANTERIOR CERVICAL DECOMP/DISCECTOMY FUSION N/A 07/02/2016   Procedure: Cervical four- five Cervical five- six  Cervical six- seven Anterior cervical decompression/diskectomy/fusion;  Surgeon: Kristeen Miss, MD;  Location: [facility];  Service: Neurosurgery;  Laterality: N/A;  C4-5 C5-6 C6-7 Anterior cervical decompression/diskectomy/fusion  . BREAST BIOPSY Left 2013   BENIGN  . BREAST SURGERY Right 1990   BX   . CARDIOVASCULAR STRESS TEST  2010   normal stress test, EF 66%  . COLONOSCOPY  03/2005   benign polyp, int hem, rpt 5 yrs ([place], [place])  . DEEP AXILLARY SENTINEL NODE BIOPSY / EXCISION  2012   RIGHT  . DEXA  06/2016   WNL  . DILATION AND CURETTAGE OF UTERUS  2010  . ENDOVENOUS ABLATION SAPHENOUS VEIN W/ LASER Left 2010   [facility]  . ENDOVENOUS ABLATION SAPHENOUS VEIN W/ LASER Left 2016   accessory branch [facility])  . ESOPHAGOGASTRODUODENOSCOPY  2006   mod chronic carditis, no H pylori ([place], [place]  . [place]   left  . LUMBAR LAMINECTOMY  2001   L4/5  . MELANOMA EXCISION  2011   Right shoulder, with sentinel lymph node biopsy  . PORT-A-CATH REMOVAL  12/05/2011   Procedure: REMOVAL PORT-A-CATH;  Surgeon: Rolm Bookbinder, MD;  Location: [place];  Service: General;  Laterality: N/A;  left port removal  . PORTACATH PLACEMENT     left subclavian    Review of Systems Per HPI unless specifically indicated in ROS section     Objective:    BP 108/68   Pulse 69   Temp 98.3 F (36.8 C) (Oral)   Wt 218 lb (98.9 kg)   BMI 38.62 kg/m   Wt Readings from Last 3 Encounters:  11/06/16 218 lb (98.9 kg)  11/01/16 219 lb 8 oz (99.6 kg)  10/09/16 220 lb 12.8 oz (100.2 kg)    Physical Exam  Constitutional: She appears well-developed and well-nourished. No distress.  Musculoskeletal: She exhibits edema (2+ LLE, 1+ RLE).  2 small ulcers with granulation tissue left medial lower leg without surrounding erythema or discharge. Slightly weepy. Sensitive lower leg to touch   Nursing note and vitals reviewed.  Results for orders placed or performed in  visit on 10/09/16  CBC with Differential [facility])  Result Value Ref Range   WBC 6.1 3.9 - 10.0 10e3/uL   RBC 4.25 3.70 - 5.32 10e6/uL   HGB 12.9 11.6 - 15.9 g/dL   HCT 39.1 34.8 - 46.6 %   MCV 92 81 - 101 fL   MCH 30.4 26.0 - 34.0 pg   MCHC 33.0 32.0 - 36.0 g/dL   RDW 12.5 11.1 - 15.7 %   Platelets 199 145 - 400 10e3/uL   NEUT# 3.9 1.5 - 6.5 10e3/uL   LYMPH# 1.6 0.9 - 3.3 10e3/uL   MONO# 0.5 0.1 - 0.9 10e3/uL  Eosinophils Absolute 0.2 0.0 - 0.5 10e3/uL   BASO# 0.0 0.0 - 0.2 10e3/uL   NEUT% 62.9 39.6 - 80.0 %   LYMPH% 25.8 14.0 - 48.0 %   MONO% 7.5 0.0 - 13.0 %   EOS% 3.6 0.0 - 7.0 %   BASO% 0.2 0.0 - 2.0 %  Lactate dehydrogenase  Result Value Ref Range   LDH 216 125 - 245 U/L  CMP STAT ([facility] only)  Result Value Ref Range   Sodium 143 128 - 145 mEq/L   Potassium 3.2 (L) 3.3 - 4.7 mEq/L   Chloride 99 98 - 108 mEq/L   CO2 30 18 - 33 mEq/L   Glucose, Bld 93 73 - 118 mg/dL   BUN, Bld 20 7 - 22 mg/dL   Creat 0.8 0.6 - 1.2 mg/dl   Total Bilirubin 0.40 0.20 - 1.60 mg/dl   Alkaline Phosphatase 62 26 - 84 U/L   AST 20 11 - 38 U/L   ALT(SGPT) 20 10 - 47 U/L   Total Protein 7.2 6.4 - 8.1 g/dL   Albumin 3.5 3.3 - 5.5 g/dL   Calcium 10.3 8.0 - 10.3 mg/dL      Assessment & Plan:   Problem List Items Addressed This Visit    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 Will refer to wound clinic to assist.       Relevant Orders   Ambulatory referral to Wound Clinic       Follow up plan: Return if symptoms worsen or fail to improve.  [name], MD

## 2016-11-07 ENCOUNTER — Encounter: Payer: Medicare Other | Admitting: Occupational Therapy

## 2016-11-07 DIAGNOSIS — M40203 Unspecified kyphosis, cervicothoracic region: Secondary | ICD-10-CM | POA: Diagnosis not present

## 2016-11-07 DIAGNOSIS — M4713 Other spondylosis with myelopathy, cervicothoracic region: Secondary | ICD-10-CM | POA: Diagnosis not present

## 2016-11-07 DIAGNOSIS — M419 Scoliosis, unspecified: Secondary | ICD-10-CM | POA: Diagnosis not present

## 2016-11-07 DIAGNOSIS — I10 Essential (primary) hypertension: Secondary | ICD-10-CM | POA: Diagnosis not present

## 2016-11-07 DIAGNOSIS — I890 Lymphedema, not elsewhere classified: Secondary | ICD-10-CM | POA: Diagnosis not present

## 2016-11-07 DIAGNOSIS — I872 Venous insufficiency (chronic) (peripheral): Secondary | ICD-10-CM | POA: Diagnosis not present

## 2016-11-08 ENCOUNTER — Ambulatory Visit: Payer: Medicare Other | Admitting: Surgery

## 2016-11-09 ENCOUNTER — Encounter: Payer: Medicare Other | Admitting: Occupational Therapy

## 2016-11-12 ENCOUNTER — Encounter: Payer: Medicare Other | Admitting: Occupational Therapy

## 2016-11-13 ENCOUNTER — Encounter: Payer: Medicare Other | Attending: Internal Medicine | Admitting: Internal Medicine

## 2016-11-13 DIAGNOSIS — M1990 Unspecified osteoarthritis, unspecified site: Secondary | ICD-10-CM | POA: Diagnosis not present

## 2016-11-13 DIAGNOSIS — L97221 Non-pressure chronic ulcer of left calf limited to breakdown of skin: Secondary | ICD-10-CM | POA: Diagnosis not present

## 2016-11-13 DIAGNOSIS — Z882 Allergy status to sulfonamides status: Secondary | ICD-10-CM | POA: Insufficient documentation

## 2016-11-13 DIAGNOSIS — Z86718 Personal history of other venous thrombosis and embolism: Secondary | ICD-10-CM | POA: Insufficient documentation

## 2016-11-13 DIAGNOSIS — Z87891 Personal history of nicotine dependence: Secondary | ICD-10-CM | POA: Diagnosis not present

## 2016-11-13 DIAGNOSIS — Z85820 Personal history of malignant melanoma of skin: Secondary | ICD-10-CM | POA: Diagnosis not present

## 2016-11-13 DIAGNOSIS — I890 Lymphedema, not elsewhere classified: Secondary | ICD-10-CM | POA: Diagnosis not present

## 2016-11-13 DIAGNOSIS — I10 Essential (primary) hypertension: Secondary | ICD-10-CM | POA: Diagnosis not present

## 2016-11-13 DIAGNOSIS — I87312 Chronic venous hypertension (idiopathic) with ulcer of left lower extremity: Secondary | ICD-10-CM | POA: Diagnosis not present

## 2016-11-13 DIAGNOSIS — I87332 Chronic venous hypertension (idiopathic) with ulcer and inflammation of left lower extremity: Secondary | ICD-10-CM | POA: Insufficient documentation

## 2016-11-13 NOTE — Progress Notes (Signed)
[name], [name] Visit Report for 11/13/2016 Chief Complaint Document Details Patient Name: [name], [name]. Date of Service: 11/13/2016 9:00 AM Medical Record Patient Account Number: [id number] Number: Treating RN: [name], RN, [name], Rita 08-[name]-1946 [phone] y.o. Other Clinician: Date of Birth/Sex: Female) Treating ROBSON, MICHAEL Primary Care Physician/Extender: Ferd Glassing Physician: Referring Physician: Donzetta Sprung in Treatment: 0 Information Obtained from: Patient Chief Complaint Left calf venous stasis ulceration. 11/13/16; the patient is here for a left calf recurrent ulceration which is painful and has been present for the last month Electronic Signature(s) Signed: 11/13/2016 4:24:12 PM By: Linton Ham MD Entered By: Linton Ham on 11/13/2016 10:49:01 [name] -------------------------------------------------------------------------------- HPI Details Patient Name: [name], [name]. Date of Service: 11/13/2016 9:00 AM Medical Record Patient Account Number: [id number] Number: Treating RN: [name], RN, [name], Rita 20-[name]-1946 [phone] y.o. Other Clinician: Date of Birth/Sex: Female) Treating ROBSON, MICHAEL Primary Care Physician/Extender: Ferd Glassing Physician: Referring Physician: Donzetta Sprung in Treatment: 0 History of Present Illness HPI Description: Pleasant 71 year old with history of chronic venous insufficiency. No diabetes or peripheral vascular disease. Left ABI 1.29. Questionable history of left lower extremity DVT. She developed a recurrent ulceration on her left lateral calf in December 2015, which she attributes to poor diet and subsequent lower extremity edema. She underwent endovenous laser ablation of her left greater saphenous vein in 2010. She underwent laser ablation of accessory branch of left GSV in April 2016 by Dr. Kellie Simmering at [facility]. She was previously wearing Unna boots,  which she tolerated well. Tolerating 2 layer compression and cadexomer iodine. She returns to clinic for follow-up and is without new complaints. She denies any significant pain at this time. She reports persistent pain with pressure. No claudication or ischemic rest pain. No fever or chills. No drainage. READMISSION 11/13/16; this is a 72 year old woman who is not a diabetic. She is here for a review of a painful area on her left medial lower extremity. I note that she was seen here previously last year for wound I believe to be in the same area. At that time she had undergone previously a left greater saphenous vein ablation by Dr. Kellie Simmering and she had a ablation of the anterior accessory branch of the left greater saphenous vein in March 2016. Seeing that the wound actually closed over. In reviewing the history with her today the ulcer in this area has been recurrent. She describes a biopsy of this area in 2009 that only showed stasis physiology. 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 Do not see that she has ever had arterial studies in the left leg. She tells me she has a follow-up with Dr. Kellie Simmering in roughly 10 days In any case she developed the reopening of this area roughly a month ago. On the background of this she describes rapidly increasing edema which has responded to Lasix 40 mg and metolazone 2.5 mg as well as the patient's lymph massage. She has been told she has both venous insufficiency and lymphedema but she cannot tolerate compression stockings Electronic Signature(s) Signed: 11/13/2016 4:24:12 PM By: Linton Ham MD Entered By: Linton Ham on 11/13/2016 12:19:22 [name]  ([id number]) -------------------------------------------------------------------------------- Physical Exam Details Patient Name: [name], [name]. Date of  Service: 11/13/2016 9:00 AM Medical Record Patient Account Number: [id number] Number: Treating RN: [name], RN, [name], Rita [birth year] [phone] y.o. Other Clinician: Date of Birth/Sex: Female) Treating ROBSON, MICHAEL Primary Care Physician/Extender: Ferd Glassing Physician: Referring Physician: [name] Weeks in Treatment: 0 Constitutional Sitting or standing Blood Pressure is within target range for patient.. Pulse regular and within target range for patient.Marland Kitchen Respirations regular, non-labored and within target range.. Temperature is normal and within the target range for the patient.. Patient's appearance is neat and clean. Appears in no acute distress. Well nourished and well developed.. Eyes The patient's left pupil looked equal to the right. She does have mild left ptosis. Respiratory Respiratory effort is easy and symmetric bilaterally. Rate is normal at rest and on room air.. Bilateral breath sounds are clear and equal in all lobes with no wheezes, rales or rhonchi.. Cardiovascular Heart rhythm and rate regular, without murmur or gallop. Her JVP is not elevated. Femoral pulses were difficult to feel. Pedal pulses palpable at the left dorsalis pedis. Edema present in both extremities. This is 2+. She does have some hemosiderin deposition compatible with venous insufficiency she may have some degree of lymphedema but overall this appears minor.Marland Kitchen Lymphatic None palpable in the popliteal or inguinal area. No lymphadenopathy or glandular swelling noted in neck.. Neurological Other than the ptosis on the left her cranial nerves seemed intact. Psychiatric No evidence of depression, anxiety, or agitation. Calm, cooperative, and communicative. Appropriate interactions and affect.. Notes Wound exam; the area in  question is on the medial aspect of her left leg. There is some surrounding scar tissue presumably from previous wounds in this area and the patient does describe recurrent difficulties over time in fact she had a biopsy of this area in 2009 that was a nonhealing wound that she ultimately had to have closed in the wound care center in [name]. She has 2 small areas both of which appear to be epithelialized. There is some denuded epithelium and what appears to be almost a petechial eruption around this wound for a longer distance inferiorly. The entire area is tender but not overtly infected no erythema Electronic Signature(s) Signed: 11/13/2016 4:24:12 PM By: Linton Ham MD Entered By: Linton Ham on 11/13/2016 12:25:42 [name], [name] [name], [name] -------------------------------------------------------------------------------- Physician Orders Details Patient Name: KENEDI, LATTIN. Date of Service: 11/13/2016 9:00 AM Medical Record Patient Account Number: [id number] Number: Treating RN: [name], RN, [name], Rita December 23, 1945 [phone] y.o. Other Clinician: Date of Birth/Sex: Female) Treating ROBSON, MICHAEL Primary Care Physician/Extender: Ferd Glassing Physician: Referring Physician: Donzetta Sprung in Treatment: 0 Verbal / Phone Orders: No Diagnosis Coding Wound Cleansing Wound #2 Left,Medial Lower Leg o Cleanse wound with mild soap and water o May Shower, gently pat wound dry prior to applying new dressing. o May shower with protection. Skin Barriers/Peri-Wound Care Wound #2 Left,Medial Lower Leg o Triamcinolone Acetonide Ointment Primary Wound Dressing Wound #2 Left,Medial Lower Leg o Promogran Secondary Dressing Wound #2 Left,Medial Lower Leg o Gauze and Kerlix/Conform Dressing Change Frequency Wound #2 Left,Medial Lower Leg o Change dressing every other day. Follow-up Appointments Wound #2 Left,Medial  Lower Leg o Return Appointment in 1 week. Edema Control Wound #2 Left,Medial Lower Leg o Elevate legs to the level of the heart and pump ankles as often as possible Consults o Dermatology - [facility] [name], [name] Patient Medications Allergies: Sulfa drugs Notifications Medication Indication Start End triamcinolone acetonide 11/13/2016 DOSE topical 0.1 % cream -  cream topical Electronic Signature(s) Signed: 11/13/2016 3:15:29 PM By: Regan Lemming [name], RN Signed: 11/13/2016 4:24:12 PM By: Linton Ham MD Entered By: Regan Lemming on 11/13/2016 10:36:25 [name], [name] -------------------------------------------------------------------------------- Prescription 11/13/2016 Patient Name: [name] Physician: [name] Dillon MD Date of Birth: 25-May-1946 NPI#: [id number] Sex: F DEA#: [id number] Phone #: [phone] License #: [id number] Patient Address: [place] [place], [place] [address] [facility] [address] [place], [place] [address] [phone] Allergies Sulfa drugs Reaction: facial swelling Medication Medication: Route: Strength: Form: triamcinolone acetonide topical 0.1 % cream Class: TOPICAL ANTI-INFLAMMATORY STEROIDAL Dose: Frequency / Time: Indication: cream topical Number of Refills: Number of Units: 0 Generic Substitution: Start Date: End Date: Administered at Substitution Permitted [id number] Facility: No Note to Pharmacy: Signature(s): Date(s): [name], [name] Electronic Signature(s) Signed: 11/13/2016 3:15:29 PM By: Regan Lemming [name], RN Signed: 11/13/2016 4:24:12 PM By: Linton Ham MD Entered By: Regan Lemming on 11/13/2016 10:36:26 [name], [name] --------------------------------------------------------------------------------  Problem List Details Patient Name: ARSHA, OSTERMEIER. Date of  Service: 11/13/2016 9:00 AM Medical Record Patient Account Number: [id number] Number: Treating RN: [name], RN, [name], Rita [birth year] [phone] y.o. Other Clinician: Date of Birth/Sex: Female) Treating ROBSON, MICHAEL Primary Care Physician/Extender: Ferd Glassing Physician: Referring Physician: Donzetta Sprung in Treatment: 0 Active Problems ICD-10 Encounter Code Description Active Date Diagnosis L97.221 Non-pressure chronic ulcer of left calf limited to 11/13/2016 Yes breakdown of skin I87.332 Chronic venous hypertension (idiopathic) with ulcer and 11/13/2016 Yes inflammation of left lower extremity I89.0 Lymphedema, not elsewhere classified 11/13/2016 Yes Inactive Problems Resolved Problems Electronic Signature(s) Signed: 11/13/2016 4:24:12 PM By: Linton Ham MD Entered By: Linton Ham on 11/13/2016 10:22:20 [name] -------------------------------------------------------------------------------- Progress Note/History and Physical Details Patient Name: TASHARI, WOODKE. Date of Service: 11/13/2016 9:00 AM Medical Record Patient Account Number: [id number] Number: Treating RN: [name], RN, [name], Rita [birth year] [phone] y.o. Other Clinician: Date of Birth/Sex: Female) Treating ROBSON, MICHAEL Primary Care Physician/Extender: Ferd Glassing Physician: Referring Physician: Donzetta Sprung in Treatment: 0 Subjective Chief Complaint Information obtained from Patient Left calf venous stasis ulceration. 11/13/16; the patient is here for a left calf recurrent ulceration which is painful and has been present for the last month History of Present Illness (HPI) Pleasant 71 year old with history of chronic venous insufficiency. No diabetes or peripheral vascular disease. Left ABI 1.29. Questionable history of left lower extremity DVT. She developed a recurrent ulceration on her left lateral calf in December 2015, which she attributes to  poor diet and subsequent lower extremity edema. She underwent endovenous laser ablation of her left greater saphenous vein in 2010. She underwent laser ablation of accessory branch of left GSV in April 2016 by Dr. Kellie Simmering at [facility]. She was previously wearing Unna boots, which she tolerated well. Tolerating 2 layer compression and cadexomer iodine. She returns to clinic for follow-up and is without new complaints. She denies any significant pain at this time. She reports persistent pain with pressure. No claudication or ischemic rest pain. No fever or chills. No drainage. READMISSION 11/13/16; this is a 71 year old woman who is not a diabetic. She is here for a review of a painful area on her left medial lower extremity. I note that she was seen here previously last year for wound I believe to be in the same area. At that time she had undergone previously a left greater saphenous vein ablation by Dr.  Kellie Simmering and she had a ablation of the anterior accessory branch of the left greater saphenous vein in March 2016. Seeing that the wound actually closed over. In reviewing the history with her today the ulcer in this area has been recurrent. She describes a biopsy of this area in 2009 that only showed stasis physiology. 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 Do not see that she has ever had arterial studies in the left leg. She tells me she has a follow-up with Dr. Kellie Simmering in roughly 10 days In any case she developed the reopening of this area roughly a month ago. On the background of this she describes rapidly increasing edema which has responded to Lasix 40 mg and metolazone 2.5 mg as well as the patient's lymph massage. She has been told she has both venous insufficiency and  lymphedema but she cannot tolerate compression stockings TAYSHA, KINSLEY. ([id number]) Wound History Patient presents with 1 open wound that has been present for approximately month. Patient has been treating wound in the following manner: topical abt. Laboratory tests have not been performed in the last month. Patient reportedly has not tested positive for an antibiotic resistant organism. Patient reportedly has not tested positive for osteomyelitis. Patient reportedly has not had testing performed to evaluate circulation in the legs. Patient experiences the following problems associated with their wounds: swelling. Patient History Information obtained from Patient. Allergies Sulfa drugs (Reaction: facial swelling) Family History Cancer - Mother, Paternal Grandparents, Hypertension - Mother, Father, Kidney Disease - Paternal Grandparents, Lung Disease - Paternal Grandparents, No family history of Diabetes, Heart Disease, Hereditary Spherocytosis, Seizures, Stroke, Thyroid Problems, Tuberculosis. Social History Former smoker - quit at age 75, Marital Status - Divorced, Alcohol Use - Never, Drug Use - No History, Caffeine Use - Daily. Medical History Eyes Patient has history of Cataracts Denies history of Glaucoma, Optic Neuritis Ear/Nose/Mouth/Throat Denies history of Chronic sinus problems/congestion, Middle ear problems Hematologic/Lymphatic Denies history of Anemia, Hemophilia, Human Immunodeficiency Virus, Lymphedema, Sickle Cell Disease Respiratory Patient has history of Asthma, Sleep Apnea Denies history of Aspiration, Chronic Obstructive Pulmonary Disease (COPD), Pneumothorax, Tuberculosis Cardiovascular Patient has history of Deep Vein Thrombosis - LLE 2010, Hypertension, Peripheral Venous Disease Denies history of Angina, Arrhythmia, Congestive Heart Failure, Coronary Artery Disease, Hypotension, Myocardial Infarction, Peripheral Arterial Disease, Phlebitis,  Vasculitis Gastrointestinal Denies history of Cirrhosis , Colitis, Crohn s, Hepatitis A, Hepatitis B, Hepatitis C Endocrine Denies history of Type I Diabetes, Type II Diabetes Genitourinary Denies history of End Stage Renal Disease Immunological Denies history of Lupus Erythematosus, Raynaud s, Scleroderma Integumentary (Skin) Denies history of History of Burn, History of pressure wounds Musculoskeletal LALANA, DELLAROCCA ([id number]) Patient has history of Osteoarthritis - neck Denies history of Gout, Rheumatoid Arthritis, Osteomyelitis Neurologic Denies history of Dementia, Neuropathy, Quadriplegia, Paraplegia, Seizure Disorder Oncologic Patient has history of Received Chemotherapy - interfeon immunotherapy, Received Radiation Psychiatric Denies history of Anorexia/bulimia, Confinement Anxiety Hospitalization/Surgery History - 08/05/2010, [facility], Melanoma sx. Medical And Surgical History Notes Constitutional Symptoms (General Health) Vein ablation LLE 2010 Vascular Sx ( vein ablationo) LLE 02/2015 Eyes Horners syndrome Genitourinary Kidney stones Neurologic ct scan showed swollen lymph nodes Oncologic Melanoma (R) shoulder 07/2010; (R) axillary lymphnode removal Review of Systems (ROS) Eyes Complains or has symptoms of Glasses /  Contacts. Ear/Nose/Mouth/Throat The patient has no complaints or symptoms. Hematologic/Lymphatic The patient has no complaints or symptoms. Respiratory The patient has no complaints or symptoms. Cardiovascular The patient has no complaints or symptoms. Gastrointestinal The patient has no complaints or symptoms. Endocrine The patient has no complaints or symptoms. Genitourinary The patient has no complaints or symptoms. Immunological The patient has no complaints or symptoms. Integumentary (Skin) Complains or has symptoms of Wounds, Swelling. Musculoskeletal The patient has no complaints or symptoms. Neurologic The patient has no complaints  or symptoms. Psychiatric The patient has no complaints or symptoms. KARITA, BUMGARDNER ([id number]) Objective Constitutional Sitting or standing Blood Pressure is within target range for patient.. Pulse regular and within target range for patient.Marland Kitchen Respirations regular, non-labored and within target range.. Temperature is normal and within the target range for the patient.. Patient's appearance is neat and clean. Appears in no acute distress. Well nourished and well developed.. Vitals Time Taken: 9:14 AM, Height: 63 in, Source: Stated, Weight: 218 lbs, Source: Stated, BMI: 38.6, Temperature: 97.9 F, Pulse: 70 bpm, Respiratory Rate: 18 breaths/min, Blood Pressure: 124/67 mmHg. Eyes The patient's left pupil looked equal to the right. She does have mild left ptosis. Respiratory Respiratory effort is easy and symmetric bilaterally. Rate is normal at rest and on room air.. Bilateral breath sounds are clear and equal in all lobes with no wheezes, rales or rhonchi.. Cardiovascular Heart rhythm and rate regular, without murmur or gallop. Her JVP is not elevated. Femoral pulses were difficult to feel. Pedal pulses palpable at the left dorsalis pedis. Edema present in both extremities. This is 2+. She does have some hemosiderin deposition compatible with venous insufficiency she may have some degree of lymphedema but overall this appears minor.Marland Kitchen Lymphatic None palpable in the popliteal or inguinal area. No lymphadenopathy or glandular swelling noted in neck.. Neurological Other than the ptosis on the left her cranial nerves seemed intact. Psychiatric No evidence of depression, anxiety, or agitation. Calm, cooperative, and communicative. Appropriate interactions and affect.. General Notes: Wound exam; the area in question is on the medial aspect of her left leg. There is some surrounding scar tissue presumably from previous wounds in this area and the patient does describe recurrent difficulties  over time in fact she had a biopsy of this area in 2009 that was a nonhealing wound that she ultimately had to have closed in the wound care center in [facility]. She has 2 small areas both of which appear to be epithelialized. There is some denuded epithelium and what appears to be almost a petechial eruption around this wound for a longer distance inferiorly. The entire area is tender but not overtly infected no erythema Sankey, Rashawna J. ([id number]) Integumentary (Hair, Skin) Wound #2 status is Open. Original cause of wound was Gradually Appeared. The wound is located on the Left,Medial Lower Leg. The wound measures 2cm length x 3cm width x 0.1cm depth; 4.712cm^2 area and 0.471cm^3 volume. The wound is limited to skin breakdown. There is no tunneling or undermining noted. There is a small amount of serous drainage noted. The wound margin is distinct with the outline attached to the wound base. There is medium (34-66%) granulation within the wound bed. There is a medium (34-66%) amount of necrotic tissue within the wound bed. The periwound skin appearance exhibited: Localized Edema, Moist, Hemosiderin Staining, Mottled. The periwound skin appearance did not exhibit: Callus, Crepitus, Excoriation, Fluctuance, Friable, Induration, Rash, Scarring, Dry/Scaly, Maceration, Atrophie Blanche, Cyanosis, Ecchymosis, Pallor, Rubor, Erythema. Periwound temperature was noted  as No Abnormality. Assessment Active Problems ICD-10 L97.221 - Non-pressure chronic ulcer of left calf limited to breakdown of skin I87.332 - Chronic venous hypertension (idiopathic) with ulcer and inflammation of left lower extremity I89.0 - Lymphedema, not elsewhere classified Plan Wound Cleansing: Wound #2 Left,Medial Lower Leg: Cleanse wound with mild soap and water May Shower, gently pat wound dry prior to applying new dressing. May shower with protection. Skin Barriers/Peri-Wound Care: Wound #2 Left,Medial Lower  Leg: Triamcinolone Acetonide Ointment Primary Wound Dressing: Wound #2 Left,Medial Lower Leg: Promogran Secondary Dressing: Wound #2 Left,Medial Lower Leg: Gauze and Kerlix/Conform Dressing Change Frequency: Wound #2 Left,Medial Lower Leg: Change dressing every other day. Follow-up Appointments: Wound #2 Left,Medial Lower Leg: CREEDENCE, RAABE ([id number]) Return Appointment in 1 week. Edema Control: Wound #2 Left,Medial Lower Leg: Elevate legs to the level of the heart and pump ankles as often as possible Consults ordered were: Dermatology - [facility] Dermatology The following medication(s) was prescribed: triamcinolone acetonide topical 0.1 % cream cream topical starting 11/13/2016 #1 I gave the patient gave patient a prescription for triamcinolone and we will cover the area with gauze Kerlix and conform. #2 while I have no doubt this patient does have some degree of venous insufficiency and I note that she is already undergone 2 different ablations of the greater saphenous vein on that side, the appearance of what this is does not appear to be obviously a venous insufficiency ulcer. Nor does it appear to be predominantly arterial. The petechial area around this lends the thought of a recurrent vasculitic reaction unknown while this would be limited to the same site in her leg #3 she still has 2+ pitting edema but this is recently been worked up by other physicians. This markedly improved apparently with a combination of Lasix and metolazone I cautioned her about this combination as there is a propensity towards dehydration and potassium loss. She does not look to be dehydrated. #4 the patient has a history of a melanoma on her right upper shoulder. [name] been followed for a number of years in [place] by Dr. Marin Olp. This does not look anything like a melanoma at least that I've seen. Nevertheless I have asked for her to make an appointment with Dr. Ronnald Ramp at  [facility] dermatology. #5 I don'[name] think it would be unreasonable to go ahead and do arterial studies. Now that I have looked through this chart in Epic I don'[name] see any previous evaluation although she is to see Dr. Kellie Simmering I believe on 20 January Electronic Signature(s) Signed: 11/13/2016 4:24:12 PM By: Linton Ham MD Entered By: Linton Ham on 11/13/2016 12:29:48 KRYSTIANA, WOLLER ([id number]) -------------------------------------------------------------------------------- ROS/PFSH Details Patient Name: JAKYRA, OFFNER. Date of Service: 11/13/2016 9:00 AM Medical Record Patient Account Number: [id number] Number: Treating RN: [name], RN, [name], Rita 1946/04/28 [phone] y.o. Other Clinician: Date of Birth/Sex: Female) Treating ROBSON, MICHAEL Primary Care Physician/Extender: Ferd Glassing Physician: Referring Physician: Donzetta Sprung in Treatment: 0 Label Progress Note Print Version as History and Physical for this encounter Information Obtained From Patient Wound History Do you currently have one or more open woundso Yes How many open wounds do you currently haveo 1 Approximately how long have you had your woundso month How have you been treating your wound(s) until nowo topical abt Has your wound(s) ever healed and then re-openedo No Have you had any lab work done in the past montho No Have you tested positive for an antibiotic resistant organism (MRSA, VRE)o No Have you tested  positive for osteomyelitis (bone infection)o No Have you had any tests for circulation on your legso No Have you had other problems associated with your woundso Swelling Eyes Complaints and Symptoms: Positive for: Glasses / Contacts Medical History: Positive for: Cataracts Negative for: Glaucoma; Optic Neuritis Past Medical History Notes: Horners syndrome Integumentary (Skin) Complaints and Symptoms: Positive for: Wounds; Swelling Medical History: Negative for: History of  Burn; History of pressure wounds Constitutional Symptoms (General Health) Medical History: Past Medical History Notes: Vein ablation LLE 2010 LENZI, KLIPP ([id number]) Vascular Sx ( vein ablationo) LLE 02/2015 Ear/Nose/Mouth/Throat Complaints and Symptoms: No Complaints or Symptoms Medical History: Negative for: Chronic sinus problems/congestion; Middle ear problems Hematologic/Lymphatic Complaints and Symptoms: No Complaints or Symptoms Medical History: Negative for: Anemia; Hemophilia; Human Immunodeficiency Virus; Lymphedema; Sickle Cell Disease Respiratory Complaints and Symptoms: No Complaints or Symptoms Medical History: Positive for: Asthma; Sleep Apnea Negative for: Aspiration; Chronic Obstructive Pulmonary Disease (COPD); Pneumothorax; Tuberculosis Cardiovascular Complaints and Symptoms: No Complaints or Symptoms Medical History: Positive for: Deep Vein Thrombosis - LLE 2010; Hypertension; Peripheral Venous Disease Negative for: Angina; Arrhythmia; Congestive Heart Failure; Coronary Artery Disease; Hypotension; Myocardial Infarction; Peripheral Arterial Disease; Phlebitis; Vasculitis Gastrointestinal Complaints and Symptoms: No Complaints or Symptoms Medical History: Negative for: Cirrhosis ; Colitis; Crohnos; Hepatitis A; Hepatitis B; Hepatitis C Endocrine Complaints and Symptoms: No Complaints or Symptoms Medical History: SHAWNDEL, SENESAC ([id number]) Negative for: Type I Diabetes; Type II Diabetes Genitourinary Complaints and Symptoms: No Complaints or Symptoms Medical History: Negative for: End Stage Renal Disease Past Medical History Notes: Kidney stones Immunological Complaints and Symptoms: No Complaints or Symptoms Medical History: Negative for: Lupus Erythematosus; Raynaudos; Scleroderma Musculoskeletal Complaints and Symptoms: No Complaints or Symptoms Medical History: Positive for: Osteoarthritis - neck Negative for: Gout; Rheumatoid  Arthritis; Osteomyelitis Neurologic Complaints and Symptoms: No Complaints or Symptoms Medical History: Negative for: Dementia; Neuropathy; Quadriplegia; Paraplegia; Seizure Disorder Past Medical History Notes: ct scan showed swollen lymph nodes Oncologic Medical History: Positive for: Received Chemotherapy - interfeon immunotherapy; Received Radiation Past Medical History Notes: Melanoma (R) shoulder 07/2010; (R) axillary lymphnode removal Psychiatric Complaints and Symptoms: No Complaints or Symptoms Medical History: NIVEEN, ISON ([id number]) Negative for: Anorexia/bulimia; Confinement Anxiety HBO Extended History Items Eyes: Cataracts Immunizations Immunization Notes: tetanus shot w/in the last 5 years per pt Hospitalization / Surgery History Name of Hospital Purpose of Hospitalization/Surgery Date [facility] Melanoma sx 08/05/2010 Family and Social History Cancer: Yes - Mother, Paternal Grandparents; Diabetes: No; Heart Disease: No; Hereditary Spherocytosis: No; Hypertension: Yes - Mother, Father; Kidney Disease: Yes - Paternal Grandparents; Lung Disease: Yes - Paternal Grandparents; Seizures: No; Stroke: No; Thyroid Problems: No; Tuberculosis: No; Former smoker - quit at age 60; Marital Status - Divorced; Alcohol Use: Never; Drug Use: No History; Caffeine Use: Daily; Advanced Directives: No; Patient does not want information on Advanced Directives; Do not resuscitate: No; Living Will: No; Medical Power of Attorney: No Electronic Signature(s) Signed: 11/13/2016 3:15:29 PM By: Regan Lemming [name], RN Signed: 11/13/2016 4:24:12 PM By: Linton Ham MD Entered By: Regan Lemming on 11/13/2016 09:48:41 OHARA, BROSH ([id number]) -------------------------------------------------------------------------------- SuperBill Details Patient Name: NAQUESHA, KASCHAK. Date of Service: 11/13/2016 Medical Record Patient Account Number: [id number] Number: Treating RN: [name], RN, [name],  Rita [birth year] [phone] y.o. Other Clinician: Date of Birth/Sex: Female) Treating ROBSON, MICHAEL Primary Care Physician/Extender: Ferd Glassing Physician: Weeks in Treatment: 0 Referring Physician: [name] Diagnosis Coding ICD-10 Codes Code Description [phone] Non-pressure chronic ulcer of left calf limited to breakdown of skin  Chronic venous hypertension (idiopathic) with ulcer and inflammation of left lower I87.332 extremity I89.0 Lymphedema, not elsewhere classified Facility Procedures CPT4 Code: [id number] Description: 99214 - WOUND CARE VISIT-LEV 4 EST PT Modifier: Quantity: 1 Physician Procedures CPT4: Description Modifier Quantity Code [id number] 99214 - WC PHYS LEVEL 4 - EST PT 1 ICD-10 Description Diagnosis L97.221 Non-pressure chronic ulcer of left calf limited to breakdown of skin I87.332 Chronic venous hypertension (idiopathic) with ulcer and  inflammation of left lower extremity Electronic Signature(s) Signed: 11/13/2016 4:24:12 PM By: Linton Ham MD Entered By: Linton Ham on 11/13/2016 12:30:33

## 2016-11-13 NOTE — Progress Notes (Signed)
Amber, [name] ([id number]) Visit Report for 11/13/2016 Abuse/Suicide Risk Screen Details Patient Name: [name], [name]. Date of Service: 11/13/2016 9:00 AM Medical Record Patient Account Number: [id number] Number: Treating RN: [name], RN, [name], Rita 1946/07/05 [phone] y.o. Other Clinician: Date of Birth/Sex: Female) Treating ROBSON, MICHAEL Primary Care Physician/Extender: Ferd Glassing Physician: Referring Physician: Donzetta Sprung in Treatment: 0 Abuse/Suicide Risk Screen Items Answer ABUSE/SUICIDE RISK SCREEN: Has anyone close to you tried to hurt or harm you recentlyo No Do you feel uncomfortable with anyone in your familyo No Has anyone forced you do things that you didnot want to doo No Do you have any thoughts of harming yourselfo No Patient displays signs or symptoms of abuse and/or neglect. No Electronic Signature(s) Signed: 11/13/2016 3:15:29 PM By: Regan Lemming [name], RN Entered By: Regan Lemming on 11/13/2016 09:09:19 [name] ([id number]) -------------------------------------------------------------------------------- Activities of Daily Living Details Patient Name: Amber, [name]. Date of Service: 11/13/2016 9:00 AM Medical Record Patient Account Number: [id number] Number: Treating RN: [name], RN, [name], Rita Mar 14, 1946 [phone] y.o. Other Clinician: Date of Birth/Sex: Female) Treating ROBSON, MICHAEL Primary Care Physician/Extender: Ferd Glassing Physician: Referring Physician: Donzetta Sprung in Treatment: 0 Activities of Daily Living Items Answer Activities of Daily Living (Please select one for each item) Drive Automobile Completely Able Take Medications Completely Able Use Telephone Completely Able Care for Appearance Completely Able Use Toilet Completely Able Bath / Shower Completely Able Dress Self Completely Able Feed Self Completely Able Walk Completely Able Get In / Out Bed Completely Able Housework Completely  Able Prepare Meals Completely Able Handle Money Completely Able Shop for Self Completely Able Electronic Signature(s) Signed: 11/13/2016 3:15:29 PM By: Regan Lemming [name], RN Entered By: Regan Lemming on 11/13/2016 09:09:38 [name] ([id number]) -------------------------------------------------------------------------------- Education Assessment Details Patient Name: Amber, [name]. Date of Service: 11/13/2016 9:00 AM Medical Record Patient Account Number: [id number] Number: Treating RN: [name], RN, [name], Rita 1946/07/28 [phone] y.o. Other Clinician: Date of Birth/Sex: Female) Treating ROBSON, MICHAEL Primary Care Physician/Extender: Ferd Glassing Physician: Referring Physician: Donzetta Sprung in Treatment: 0 Primary Learner Assessed: Patient Learning Preferences/Education Level/Primary Language Learning Preference: Explanation Highest Education Level: College or Above Preferred Language: English Cognitive Barrier Assessment/Beliefs Language Barrier: No Physical Barrier Assessment Impaired Vision: Yes Glasses Impaired Hearing: No Decreased Hand dexterity: No Knowledge/Comprehension Assessment Knowledge Level: High Comprehension Level: High Ability to understand written High instructions: Ability to understand verbal High instructions: Motivation Assessment Anxiety Level: Calm Cooperation: Cooperative Education Importance: Acknowledges Need Interest in Health Problems: Asks Questions Perception: Coherent Willingness to Engage in Self- High Management Activities: Readiness to Engage in Self- High Management Activities: Electronic Signature(s) Signed: 11/13/2016 3:15:29 PM By: Regan Lemming [name], RN Kretz[place], [place] Lenna Sciara ([id number]) Entered By: Regan Lemming on 11/13/2016 09:09:59 KAREY, SCHILKE ([id number]) -------------------------------------------------------------------------------- Fall Risk Assessment Details Patient Name: Amber, [name]. Date  of Service: 11/13/2016 9:00 AM Medical Record Patient Account Number: [id number] Number: Treating RN: [name], RN, [name], Rita 12/15/1945 [phone] y.o. Other Clinician: Date of Birth/Sex: Female) Treating ROBSON, MICHAEL Primary Care Physician/Extender: Ferd Glassing Physician: Referring Physician: Donzetta Sprung in Treatment: 0 Fall Risk Assessment Items Have you had 2 or more falls in the last 12 monthso 0 No Have you had any fall that resulted in injury in the last 12 monthso 0 No FALL RISK ASSESSMENT: History of falling - immediate or within 3 months 0 No Secondary diagnosis 0 No Ambulatory aid None/bed rest/wheelchair/nurse 0 Yes Crutches/cane/walker  0 No Furniture 0 No IV Access/Saline Lock 0 No Gait/Training Normal/bed rest/immobile 0 Yes Weak 0 No Impaired 0 No Mental Status Oriented to own ability 0 Yes Electronic Signature(s) Signed: 11/13/2016 3:15:29 PM By: Regan Lemming [name], RN Entered By: Regan Lemming on 11/13/2016 09:10:11 [name] ([id number]) -------------------------------------------------------------------------------- Foot Assessment Details Patient Name: Amber, [name]. Date of Service: 11/13/2016 9:00 AM Medical Record Patient Account Number: [id number] Number: Treating RN: [name], RN, [name], Rita July 22, 1946 [phone] y.o. Other Clinician: Date of Birth/Sex: Female) Treating ROBSON, MICHAEL Primary Care Physician/Extender: Ferd Glassing Physician: Referring Physician: Donzetta Sprung in Treatment: 0 Foot Assessment Items Site Locations + = Sensation present, - = Sensation absent, C = Callus, U = Ulcer R = Redness, W = Warmth, M = Maceration, PU = Pre-ulcerative lesion F = Fissure, S = Swelling, D = Dryness Assessment Right: Left: Other Deformity: No No Prior Foot Ulcer: No No Prior Amputation: No No Charcot Joint: No No Ambulatory Status: Ambulatory Without Help Gait: Steady Electronic Signature(s) Signed:  11/13/2016 3:15:29 PM By: Regan Lemming [name], RN Entered By: Regan Lemming on 11/13/2016 09:10:24 ARIYA, MONCRIEF ([id number]) Ferdinand [name], [name] -------------------------------------------------------------------------------- Nutrition Risk Assessment Details Patient Name: LACHANDRA, RESCH. Date of Service: 11/13/2016 9:00 AM Medical Record Patient Account Number: [id number] Number: Treating RN: [name], RN, [name], Rita 1946-03-17 [phone] y.o. Other Clinician: Date of Birth/Sex: Female) Treating ROBSON, MICHAEL Primary Care Physician/Extender: Ferd Glassing Physician: Referring Physician: Donzetta Sprung in Treatment: 0 Height (in): 63 Weight (lbs): 214.7 Body Mass Index (BMI): 38 Nutrition Risk Assessment Items NUTRITION RISK SCREEN: I have an illness or condition that made me change the kind and/or 0 No amount of food I eat I eat fewer than two meals per day 0 No I eat few fruits and vegetables, or milk products 0 No I have three or more drinks of beer, liquor or wine almost every day 0 No I have tooth or mouth problems that make it hard for me to eat 0 No I don't always have enough money to buy the food I need 0 No I eat alone most of the time 0 No I take three or more different prescribed or over-the-counter drugs a 0 No day Without wanting to, I have lost or gained 10 pounds in the last six 0 No months I am not always physically able to shop, cook and/or feed myself 0 No Nutrition Protocols Good Risk Protocol 0 No interventions needed Moderate Risk Protocol Electronic Signature(s) Signed: 11/13/2016 3:15:29 PM By: Regan Lemming [name], RN Entered By: Regan Lemming on 11/13/2016 09:10:16

## 2016-11-13 NOTE — Progress Notes (Signed)
MASA, BRISKI ([id number]) Visit Report for 11/13/2016 Allergy List Details Patient Name: [name], [name]. Date of Service: 11/13/2016 9:00 AM Medical Record Patient Account Number: [id number] Number: Treating RN: [name], RN, [name], Rita 1946-04-08 [id number] y.o. Other Clinician: Date of Birth/Sex: Female) Treating ROBSON, MICHAEL Primary Care Physician: [name] Physician/Extender: G Referring Physician: [name] Weeks in Treatment: 0 Allergies Active Allergies Sulfa drugs Reaction: facial swelling Allergy Notes Electronic Signature(s) Signed: 11/13/2016 3:15:29 PM By: Regan Lemming [name], RN Entered By: Regan Lemming on 11/13/2016 09:09:08 [name] ([id number]) -------------------------------------------------------------------------------- Arrival Information Details Patient Name: [name], [name]. Date of Service: 11/13/2016 9:00 AM Medical Record Patient Account Number: [id number] Number: Treating RN: [name], RN, [name], Rita [birth year] [phone] y.o. Other Clinician: Date of Birth/Sex: Female) Treating ROBSON, MICHAEL Primary Care Physician: [name] Physician/Extender: G Referring Physician: Donzetta Sprung in Treatment: 0 Visit Information Patient Arrived: Ambulatory Arrival Time: 09:08 Accompanied By: self Transfer Assistance: None Patient Identification Verified: Yes Secondary Verification Process Yes Completed: Patient Requires Transmission-Based No Precautions: Patient Has Alerts: No History Since Last Visit All ordered tests and consults were completed: No Added or deleted any medications: No Any new allergies or adverse reactions: No Had a fall or experienced change in activities of daily living that may affect risk of falls: No Signs or symptoms of abuse/neglect since last visito No Hospitalized since last visit: No Electronic Signature(s) Signed: 11/13/2016 3:15:29 PM By: Regan Lemming [name], RN Entered By: Regan Lemming on  11/13/2016 09:08:52 [name] ([id number]) -------------------------------------------------------------------------------- Clinic Level of Care Assessment Details Patient Name: [name]. Date of Service: 11/13/2016 9:00 AM Medical Record Patient Account Number: [id number] Number: Treating RN: [name], RN, [name], Rita September 24, 1946 [phone] y.o. Other Clinician: Date of Birth/Sex: Female) Treating ROBSON, [place] Primary Care Physician: [name] Physician/Extender: G Referring Physician: Donzetta Sprung in Treatment: 0 Clinic Level of Care Assessment Items TOOL 2 Quantity Score []  - Use when only an EandM is performed on the INITIAL visit 0 ASSESSMENTS - Nursing Assessment / Reassessment X - General Physical Exam (combine w/ comprehensive assessment (listed just 1 20 below) when performed on new pt. evals) X - Comprehensive Assessment (HX, ROS, Risk Assessments, Wounds Hx, etc.) 1 25 ASSESSMENTS - Wound and Skin Assessment / Reassessment X - Simple Wound Assessment / Reassessment - one wound 1 5 []  - Complex Wound Assessment / Reassessment - multiple wounds 0 []  - Dermatologic / Skin Assessment (not related to wound area) 0 ASSESSMENTS - Ostomy and/or Continence Assessment and Care []  - Incontinence Assessment and Management 0 []  - Ostomy Care Assessment and Management (repouching, etc.) 0 PROCESS - Coordination of Care X - Simple Patient / Family Education for ongoing care 1 15 []  - Complex (extensive) Patient / Family Education for ongoing care 0 []  - Staff obtains Programmer, systems, Records, Test Results / Process Orders 0 []  - Staff telephones HHA, Nursing Homes / Clarify orders / etc 0 []  - Routine Transfer to another Facility (non-emergent condition) 0 []  - Routine Hospital Admission (non-emergent condition) 0 X - New Admissions / Biomedical engineer / Ordering NPWT, Apligraf, etc. 1 15 []  - Emergency Hospital Admission (emergent condition) 0 Sellick,  Pleshette J. ([id number]) []  - Simple Discharge Coordination 0 []  - Complex (extensive) Discharge Coordination 0 PROCESS - Special Needs []  - Pediatric / Minor Patient Management 0 []  - Isolation Patient Management 0 []  - Hearing / Language / Visual special needs 0 []  - Assessment of Community assistance (transportation,  D/C planning, etc.) 0 []  - Additional assistance / Altered mentation 0 []  - Support Surface(s) Assessment (bed, cushion, seat, etc.) 0 INTERVENTIONS - Wound Cleansing / Measurement X - Wound Imaging (photographs - any number of wounds) 1 5 []  - Wound Tracing (instead of photographs) 0 X - Simple Wound Measurement - one wound 1 5 []  - Complex Wound Measurement - multiple wounds 0 X - Simple Wound Cleansing - one wound 1 5 []  - Complex Wound Cleansing - multiple wounds 0 INTERVENTIONS - Wound Dressings X - Small Wound Dressing one or multiple wounds 1 10 []  - Medium Wound Dressing one or multiple wounds 0 []  - Large Wound Dressing one or multiple wounds 0 []  - Application of Medications - injection 0 INTERVENTIONS - Miscellaneous []  - External ear exam 0 []  - Specimen Collection (cultures, biopsies, blood, body fluids, etc.) 0 []  - Specimen(s) / Culture(s) sent or taken to Lab for analysis 0 []  - Patient Transfer (multiple staff / Harrel Lemon Lift / Similar devices) 0 []  - Simple Staple / Suture removal (25 or less) 0 Seider, Portia J. ([id number]) []  - Complex Staple / Suture removal (26 or more) 0 []  - Hypo / Hyperglycemic Management (close monitor of Blood Glucose) 0 X - Ankle / Brachial Index (ABI) - do not check if billed separately 1 15 Has the patient been seen at the hospital within the last three years: Yes Total Score: 120 Level Of Care: New/Established - Level 4 Electronic Signature(s) Signed: 11/13/2016 3:15:29 PM By: Regan Lemming [name], RN Entered By: Regan Lemming on 11/13/2016 09:53:57 Oser, [name]  ([id number]) -------------------------------------------------------------------------------- Encounter Discharge Information Details Patient Name: [name], [name]. Date of Service: 11/13/2016 9:00 AM Medical Record Patient Account Number: [id number] Number: Treating RN: [name], RN, [name], Rita 04-24-1946 [phone] y.o. Other Clinician: Date of Birth/Sex: Female) Treating ROBSON, MICHAEL Primary Care Physician: [name] Physician/Extender: G Referring Physician: Donzetta Sprung in Treatment: 0 Encounter Discharge Information Items Discharge Pain Level: 0 Discharge Condition: Stable Ambulatory Status: Ambulatory Discharge Destination: Home Transportation: Private Auto Accompanied By: self Schedule Follow-up Appointment: No Medication Reconciliation completed and provided to Patient/Care No Marenda Accardi: Provided on Clinical Summary of Care: 11/13/2016 Form Type Recipient Paper Patient LP Electronic Signature(s) Signed: 11/13/2016 3:11:43 PM By: Regan Lemming [name], RN Previous Signature: 11/13/2016 10:06:54 AM Version By: Ruthine Dose Entered By: Regan Lemming on 11/13/2016 15:11:43 Keeven, [name] -------------------------------------------------------------------------------- Lower Extremity Assessment Details Patient Name: PAITYN, SHERGILL. Date of Service: 11/13/2016 9:00 AM Medical Record Patient Account Number: [id number] Number: Treating RN: [name], RN, [name], Rita 1946/10/22 [phone] y.o. Other Clinician: Date of Birth/Sex: Female) Treating ROBSON, [place] Primary Care Physician: [name] Physician/Extender: G Referring Physician: Donzetta Sprung in Treatment: 0 Edema Assessment Assessed: [Left: No] [Right: No] E[Left: dema] [Right: :] Calf Left: Right: Point of Measurement: 30 cm From Medial Instep 43 cm 41 cm Ankle Left: Right: Point of Measurement: 9 cm From Medial Instep 24 cm 23.5 cm Vascular  Assessment Claudication: Claudication Assessment [Left:None] [Right:None] Pulses: Dorsalis Pedis Palpable: [Left:No] [Right:No] Posterior Tibial Extremity colors, hair growth, and conditions: Extremity Color: [Left:Mottled] [Right:Mottled] Hair Growth on Extremity: [Left:Yes] [Right:Yes] Temperature of Extremity: [Left:Warm] [Right:Warm] Capillary Refill: [Left:< 3 seconds] [Right:< 3 seconds] Blood Pressure: Brachial: [Left:124] Dorsalis Pedis: [Left:Dorsalis Pedis: 120] Ankle: Posterior Tibial: [Left:Posterior Tibial:] [Right:0.97] Toe Nail Assessment Left: Right: Thick: Yes Yes Discolored: Yes Yes Deformed: No No Furuya, Shamia J. ([id number]) Improper Length and Hygiene: No No Electronic Signature(s) Signed: 11/13/2016 3:15:29  PM By: Regan Lemming [name], RN Entered By: Regan Lemming on 11/13/2016 09:27:04 [name] -------------------------------------------------------------------------------- Multi Wound Chart Details Patient Name: ESHANA, KOLESAR. Date of Service: 11/13/2016 9:00 AM Medical Record Patient Account Number: [id number] Number: Treating RN: [name], RN, [name], Rita [birth year] [phone] y.o. Other Clinician: Date of Birth/Sex: Female) Treating ROBSON, [place] Primary Care Physician: [name] Physician/Extender: G Referring Physician: Donzetta Sprung in Treatment: 0 Vital Signs Height(in): 63 Pulse(bpm): 70 Weight(lbs): 218 Blood Pressure 124/67 (mmHg): Body Mass Index(BMI): 39 Temperature(F): 97.9 Respiratory Rate 18 (breaths/min): Photos: [2:No Photos] [N/A:N/A] Wound Location: [2:Left Lower Leg - Medial] [N/A:N/A] Wounding Event: [2:Gradually Appeared] [N/A:N/A] Primary Etiology: [2:Atypical] [N/A:N/A] Comorbid History: [2:Cataracts, Asthma, Sleep Apnea, Deep Vein Thrombosis, Hypertension, Peripheral Venous Disease, Osteoarthritis, Received Chemotherapy, Received Radiation] [N/A:N/A] Date Acquired: [2:10/10/2016]  [N/A:N/A] Weeks of Treatment: [2:0] [N/A:N/A] Wound Status: [2:Open] [N/A:N/A] Measurements L x W x D 2x3x0.1 [N/A:N/A] (cm) Area (cm) : [2:4.712] [N/A:N/A] Volume (cm) : [2:0.471] [N/A:N/A] Classification: [2:Partial Thickness] [N/A:N/A] Exudate Amount: [2:Small] [N/A:N/A] Exudate Type: [2:Serous] [N/A:N/A] Exudate Color: [2:amber] [N/A:N/A] Wound Margin: [2:Distinct, outline attached] [N/A:N/A] Granulation Amount: [2:Medium (34-66%)] [N/A:N/A] Necrotic Amount: [2:Medium (34-66%)] [N/A:N/A] Exposed Structures: [2:Fascia: No Fat: No] [N/A:N/A] Tendon: No Muscle: No Joint: No Bone: No Limited to Skin Breakdown Epithelialization: None N/A N/A Periwound Skin Texture: Edema: Yes N/A N/A Excoriation: No Induration: No Callus: No Crepitus: No Fluctuance: No Friable: No Rash: No Scarring: No Periwound Skin Moist: Yes N/A N/A Moisture: Maceration: No Dry/Scaly: No Periwound Skin Color: Hemosiderin Staining: Yes N/A N/A Mottled: Yes Atrophie Blanche: No Cyanosis: No Ecchymosis: No Erythema: No Pallor: No Rubor: No Temperature: No Abnormality N/A N/A Tenderness on No N/A N/A Palpation: Wound Preparation: Ulcer Cleansing: N/A N/A Rinsed/Irrigated with Saline Topical Anesthetic Applied: Other: lidocaine 4% Treatment Notes Electronic Signature(s) Signed: 11/13/2016 4:24:12 PM By: Linton Ham MD Entered By: Linton Ham on 11/13/2016 10:45:50 Chasse, [name] -------------------------------------------------------------------------------- Multi-Disciplinary Care Plan Details Patient Name: BERENDA, GARRY. Date of Service: 11/13/2016 9:00 AM Medical Record Patient Account Number: [id number] Number: Treating RN: [name], RN, [name], Rita 03-16-1946 [phone] y.o. Other Clinician: Date of Birth/Sex: Female) Treating ROBSON, MICHAEL Primary Care Physician: [name] Physician/Extender: G Referring Physician: Donzetta Sprung in  Treatment: 0 Active Inactive Orientation to the Wound Care Program Nursing Diagnoses: Knowledge deficit related to the wound healing center program Goals: Patient/caregiver will verbalize understanding of the [place] Program Date Initiated: 11/13/2016 Goal Status: Active Interventions: Provide education on orientation to the wound center Notes: Venous Leg Ulcer Nursing Diagnoses: Knowledge deficit related to disease process and management Potential for venous Insuffiency (use before diagnosis confirmed) Goals: Patient will maintain optimal edema control Date Initiated: 11/13/2016 Goal Status: Active Patient/caregiver will verbalize understanding of disease process and disease management Date Initiated: 11/13/2016 Goal Status: Active Verify adequate tissue perfusion prior to therapeutic compression application Date Initiated: 11/13/2016 Goal Status: Active Interventions: Assess peripheral edema status every visit. Compression as ordered LUMI, [name] ([id number]) Provide education on venous insufficiency Notes: Wound/Skin Impairment Nursing Diagnoses: Impaired tissue integrity Knowledge deficit related to ulceration/compromised skin integrity Goals: Patient/caregiver will verbalize understanding of skin care regimen Date Initiated: 11/13/2016 Goal Status: Active Ulcer/skin breakdown will have a volume reduction of 30% by week 4 Date Initiated: 11/13/2016 Goal Status: Active Ulcer/skin breakdown will have a volume reduction of 50% by week 8 Date Initiated: 11/13/2016 Goal Status: Active Ulcer/skin breakdown will have a volume reduction of 80% by week 12 Date Initiated: 11/13/2016 Goal  Status: Active Ulcer/skin breakdown will heal within 14 weeks Date Initiated: 11/13/2016 Goal Status: Active Interventions: Assess patient/caregiver ability to obtain necessary supplies Assess patient/caregiver ability to perform ulcer/skin care regimen upon admission and as  needed Assess ulceration(s) every visit Provide education on ulcer and skin care Treatment Activities: Referred to DME Jakelin Taussig for dressing supplies : 11/13/2016 Skin care regimen initiated : 11/13/2016 Topical wound management initiated : 11/13/2016 Notes: Electronic Signature(s) Signed: 11/13/2016 3:15:29 PM By: Regan Lemming [name], RN Entered By: Regan Lemming on 11/13/2016 09:29:18 [name] -------------------------------------------------------------------------------- Pain Assessment Details Patient Name: BEIJA, BASICH. Date of Service: 11/13/2016 9:00 AM Medical Record Patient Account Number: [id number] Number: Treating RN: [name], RN, [name], Rita 1946-09-19 [phone] y.o. Other Clinician: Date of Birth/Sex: Female) Treating ROBSON, MICHAEL Primary Care Physician: [name] Physician/Extender: G Referring Physician: Donzetta Sprung in Treatment: 0 Active Problems Location of Pain Severity and Description of Pain Patient Has Paino No Site Locations With Dressing Change: No Pain Management and Medication Current Pain Management: Electronic Signature(s) Signed: 11/13/2016 3:15:29 PM By: Regan Lemming [name], RN Entered By: Regan Lemming on 11/13/2016 09:08:59 [name] -------------------------------------------------------------------------------- Patient/Caregiver Education Details Patient Name: YALIXA, GIUFFRE. Date of Service: 11/13/2016 9:00 AM Medical Record Patient Account Number: [id number] Number: Treating RN: [name], RN, [name], Rita 08-Dec-1945 [phone] y.o. Other Clinician: Date of Birth/Gender: Female) Treating ROBSON, [place] Primary Care Physician: [name] Physician/Extender: G Referring Physician: Donzetta Sprung in Treatment: 0 Education Assessment Education Provided To: Patient Education Topics Provided Venous: Methods: Explain/Verbal Responses: State content correctly Welcome To [facility]: Methods: Explain/Verbal Responses: State content correctly Wound/Skin Impairment: Methods: Explain/Verbal Responses: State content correctly Electronic Signature(s) Signed: 11/13/2016 3:15:29 PM By: Regan Lemming [name], RN Entered By: Regan Lemming on 11/13/2016 15:12:26 [name] -------------------------------------------------------------------------------- Wound Assessment Details Patient Name: LAJAYLA, ZACKERY. Date of Service: 11/13/2016 9:00 AM Medical Record Patient Account Number: [id number] Number: Treating RN: [name], RN, [name], Rita [birth year] [phone] y.o. Other Clinician: Date of Birth/Sex: Female) Treating ROBSON, MICHAEL Primary Care Physician: [name] Physician/Extender: G Referring Physician: Donzetta Sprung in Treatment: 0 Wound Status Wound Number: 2 Primary Atypical Etiology: Wound Location: Left Lower Leg - Medial Wound Open Wounding Event: Gradually Appeared Status: Date Acquired: 10/10/2016 Comorbid Cataracts, Asthma, Sleep Apnea, Deep Weeks Of Treatment: 0 History: Vein Thrombosis, Hypertension, Clustered Wound: No Peripheral Venous Disease, Osteoarthritis, Received Chemotherapy, Received Radiation Photos Photo Uploaded By: Regan Lemming on 11/13/2016 15:08:09 Wound Measurements Length: (cm) 2 Width: (cm) 3 Depth: (cm) 0.1 Area: (cm) 4.712 Volume: (cm) 0.471 % Reduction in Area: % Reduction in Volume: Epithelialization: None Tunneling: No Undermining: No Wound Description JANIT, LATTIN ([id number]) Classification: Partial Thickness Foul Odor After Cleansing: No Wound Margin: Distinct, outline attached Exudate Amount: Small Exudate Type: Serous Exudate Color: amber Wound Bed Granulation Amount: Medium (34-66%) Exposed Structure Necrotic Amount: Medium (34-66%) Fascia Exposed: No Fat Layer Exposed: No Tendon Exposed: No Muscle Exposed: No Joint Exposed: No Bone Exposed: No Limited to  Skin Breakdown Periwound Skin Texture Texture Color No Abnormalities Noted: No No Abnormalities Noted: No Callus: No Atrophie Blanche: No Crepitus: No Cyanosis: No Excoriation: No Ecchymosis: No Fluctuance: No Erythema: No Friable: No Hemosiderin Staining: Yes Induration: No Mottled: Yes Localized Edema: Yes Pallor: No Rash: No Rubor: No Scarring: No Temperature / Pain Moisture Temperature: No Abnormality No Abnormalities Noted: No Dry / Scaly: No Maceration: No Moist: Yes Wound Preparation Ulcer Cleansing: Rinsed/Irrigated with Saline  Topical Anesthetic Applied: Other: lidocaine 4%, Treatment Notes Wound #2 (Left, Medial Lower Leg) 1. Cleansed with: Clean wound with Normal Saline 3. Peri-wound Care: Other peri-wound care (specify in notes) 4. Dressing Applied: RITAANN, LEPPO ([id number]) 5. Secondary Dressing Applied ABD Pad Dry Gauze Electronic Signature(s) Signed: 11/13/2016 3:15:29 PM By: Regan Lemming [name], RN Entered By: Regan Lemming on 11/13/2016 09:18:27 [name] ([id number]) -------------------------------------------------------------------------------- Vitals Details Patient Name: WINEFRED, DIELEMAN. Date of Service: 11/13/2016 9:00 AM Medical Record Patient Account Number: [id number] Number: Treating RN: [name], RN, [name], Rita September 19, 1946 [phone] y.o. Other Clinician: Date of Birth/Sex: Female) Treating ROBSON, [place] Primary Care Physician: [name] Physician/Extender: G Referring Physician: Donzetta Sprung in Treatment: 0 Vital Signs Time Taken: 09:14 Temperature (F): 97.9 Height (in): 63 Pulse (bpm): 70 Source: Stated Respiratory Rate (breaths/min): 18 Weight (lbs): 218 Blood Pressure (mmHg): 124/67 Source: Stated Reference Range: 80 - 120 mg / dl Body Mass Index (BMI): 38.6 Electronic Signature(s) Signed: 11/13/2016 3:15:29 PM By: Regan Lemming [name], RN Entered By: Regan Lemming on 11/13/2016 09:15:06

## 2016-11-14 ENCOUNTER — Encounter: Payer: Medicare Other | Admitting: Occupational Therapy

## 2016-11-14 DIAGNOSIS — G902 Horner's syndrome: Secondary | ICD-10-CM | POA: Diagnosis not present

## 2016-11-14 DIAGNOSIS — M4712 Other spondylosis with myelopathy, cervical region: Secondary | ICD-10-CM | POA: Diagnosis not present

## 2016-11-15 DIAGNOSIS — M40203 Unspecified kyphosis, cervicothoracic region: Secondary | ICD-10-CM | POA: Diagnosis not present

## 2016-11-15 DIAGNOSIS — I872 Venous insufficiency (chronic) (peripheral): Secondary | ICD-10-CM | POA: Diagnosis not present

## 2016-11-15 DIAGNOSIS — M419 Scoliosis, unspecified: Secondary | ICD-10-CM | POA: Diagnosis not present

## 2016-11-15 DIAGNOSIS — I890 Lymphedema, not elsewhere classified: Secondary | ICD-10-CM | POA: Diagnosis not present

## 2016-11-15 DIAGNOSIS — M4713 Other spondylosis with myelopathy, cervicothoracic region: Secondary | ICD-10-CM | POA: Diagnosis not present

## 2016-11-15 DIAGNOSIS — I10 Essential (primary) hypertension: Secondary | ICD-10-CM | POA: Diagnosis not present

## 2016-11-16 ENCOUNTER — Encounter: Payer: Medicare Other | Admitting: Occupational Therapy

## 2016-11-19 ENCOUNTER — Encounter: Payer: Medicare Other | Admitting: Occupational Therapy

## 2016-11-20 ENCOUNTER — Encounter: Payer: Medicare Other | Admitting: Internal Medicine

## 2016-11-20 DIAGNOSIS — M4713 Other spondylosis with myelopathy, cervicothoracic region: Secondary | ICD-10-CM | POA: Diagnosis not present

## 2016-11-20 DIAGNOSIS — Z87891 Personal history of nicotine dependence: Secondary | ICD-10-CM | POA: Diagnosis not present

## 2016-11-20 DIAGNOSIS — I87332 Chronic venous hypertension (idiopathic) with ulcer and inflammation of left lower extremity: Secondary | ICD-10-CM | POA: Diagnosis not present

## 2016-11-20 DIAGNOSIS — I10 Essential (primary) hypertension: Secondary | ICD-10-CM | POA: Diagnosis not present

## 2016-11-20 DIAGNOSIS — M419 Scoliosis, unspecified: Secondary | ICD-10-CM | POA: Diagnosis not present

## 2016-11-20 DIAGNOSIS — R600 Localized edema: Secondary | ICD-10-CM | POA: Diagnosis not present

## 2016-11-20 DIAGNOSIS — M40203 Unspecified kyphosis, cervicothoracic region: Secondary | ICD-10-CM | POA: Diagnosis not present

## 2016-11-20 DIAGNOSIS — Z882 Allergy status to sulfonamides status: Secondary | ICD-10-CM | POA: Diagnosis not present

## 2016-11-20 DIAGNOSIS — I872 Venous insufficiency (chronic) (peripheral): Secondary | ICD-10-CM | POA: Diagnosis not present

## 2016-11-20 DIAGNOSIS — L97221 Non-pressure chronic ulcer of left calf limited to breakdown of skin: Secondary | ICD-10-CM | POA: Diagnosis not present

## 2016-11-20 DIAGNOSIS — I87312 Chronic venous hypertension (idiopathic) with ulcer of left lower extremity: Secondary | ICD-10-CM | POA: Diagnosis not present

## 2016-11-20 DIAGNOSIS — I890 Lymphedema, not elsewhere classified: Secondary | ICD-10-CM | POA: Diagnosis not present

## 2016-11-21 ENCOUNTER — Encounter: Payer: Medicare Other | Admitting: Occupational Therapy

## 2016-11-21 NOTE — Progress Notes (Signed)
[name], [name] Visit Report for 11/20/2016 Arrival Information Details Patient Name: [name], [name]. Date of Service: 11/20/2016 11:15 AM Medical Record Patient Account Number: [id number] Number: Treating RN: [name], RN, [name], Rita September 16, 1946 [phone] y.o. Other Clinician: Date of Birth/Sex: Female) Treating ROBSON, MICHAEL Primary Care Physician: [name] Physician/Extender: G Referring Physician: Donzetta Sprung in Treatment: 1 Visit Information History Since Last Visit All ordered tests and consults were completed: No Patient Arrived: Ambulatory Added or deleted any medications: No Arrival Time: 11:24 Any new allergies or adverse reactions: No Accompanied By: self Had a fall or experienced change in No Transfer Assistance: None activities of daily living that may affect Patient Identification Verified: Yes risk of falls: Secondary Verification Process Yes Signs or symptoms of abuse/neglect since last No Completed: visito Patient Requires Transmission-Based No Hospitalized since last visit: No Precautions: Has Dressing in Place as Prescribed: Yes Patient Has Alerts: No Pain Present Now: No Electronic Signature(s) Signed: 11/20/2016 4:59:50 PM By: Regan Lemming [name], RN Entered By: Regan Lemming on 11/20/2016 11:24:48 [name] -------------------------------------------------------------------------------- Clinic Level of Care Assessment Details Patient Name: [name], [name]. Date of Service: 11/20/2016 11:15 AM Medical Record Patient Account Number: [id number] Number: Treating RN: [name], RN, [name], Rita Mar 13, 1946 [phone] y.o. Other Clinician: Date of Birth/Sex: Female) Treating ROBSON, [place] Primary Care Physician: [name] Physician/Extender: G Referring Physician: Donzetta Sprung in Treatment: 1 Clinic Level of Care Assessment Items TOOL 4 Quantity Score []  - Use when only an EandM is  performed on FOLLOW-UP visit 0 ASSESSMENTS - Nursing Assessment / Reassessment X - Reassessment of Co-morbidities (includes updates in patient status) 1 10 X - Reassessment of Adherence to Treatment Plan 1 5 ASSESSMENTS - Wound and Skin Assessment / Reassessment X - Simple Wound Assessment / Reassessment - one wound 1 5 []  - Complex Wound Assessment / Reassessment - multiple wounds 0 []  - Dermatologic / Skin Assessment (not related to wound area) 0 ASSESSMENTS - Focused Assessment []  - Circumferential Edema Measurements - multi extremities 0 []  - Nutritional Assessment / Counseling / Intervention 0 X - Lower Extremity Assessment (monofilament, tuning fork, pulses) 1 5 []  - Peripheral Arterial Disease Assessment (using hand held doppler) 0 ASSESSMENTS - Ostomy and/or Continence Assessment and Care []  - Incontinence Assessment and Management 0 []  - Ostomy Care Assessment and Management (repouching, etc.) 0 PROCESS - Coordination of Care X - Simple Patient / Family Education for ongoing care 1 15 []  - Complex (extensive) Patient / Family Education for ongoing care 0 []  - Staff obtains Programmer, systems, Records, Test Results / Process Orders 0 []  - Staff telephones HHA, Nursing Homes / Clarify orders / etc 0 [name], [name] []  - Routine Transfer to another Facility (non-emergent condition) 0 []  - Routine Hospital Admission (non-emergent condition) 0 []  - New Admissions / Biomedical engineer / Ordering NPWT, Apligraf, etc. 0 []  - Emergency Hospital Admission (emergent condition) 0 []  - Simple Discharge Coordination 0 []  - Complex (extensive) Discharge Coordination 0 PROCESS - Special Needs []  - Pediatric / Minor Patient Management 0 []  - Isolation Patient Management 0 []  - Hearing / Language / Visual special needs 0 []  - Assessment of Community assistance (transportation, D/C planning, etc.) 0 []  - Additional assistance / Altered mentation 0 []  - Support Surface(s) Assessment  (bed, cushion, seat, etc.) 0 INTERVENTIONS - Wound Cleansing / Measurement X - Simple Wound Cleansing - one wound 1 5 []  - Complex Wound Cleansing - multiple wounds  0 X - Wound Imaging (photographs - any number of wounds) 1 5 []  - Wound Tracing (instead of photographs) 0 X - Simple Wound Measurement - one wound 1 5 []  - Complex Wound Measurement - multiple wounds 0 INTERVENTIONS - Wound Dressings X - Small Wound Dressing one or multiple wounds 1 10 []  - Medium Wound Dressing one or multiple wounds 0 []  - Large Wound Dressing one or multiple wounds 0 []  - Application of Medications - topical 0 []  - Application of Medications - injection 0 [name], [name]. ([id number]) INTERVENTIONS - Miscellaneous []  - External ear exam 0 []  - Specimen Collection (cultures, biopsies, blood, body fluids, etc.) 0 []  - Specimen(s) / Culture(s) sent or taken to Lab for analysis 0 []  - Patient Transfer (multiple staff / Harrel Lemon Lift / Similar devices) 0 []  - Simple Staple / Suture removal (25 or less) 0 []  - Complex Staple / Suture removal (26 or more) 0 []  - Hypo / Hyperglycemic Management (close monitor of Blood Glucose) 0 []  - Ankle / Brachial Index (ABI) - do not check if billed separately 0 X - Vital Signs 1 5 Has the patient been seen at the hospital within the last three years: Yes Total Score: 70 Level Of Care: New/Established - Level 2 Electronic Signature(s) Signed: 11/20/2016 4:59:50 PM By: Regan Lemming [name], RN Entered By: Regan Lemming on 11/20/2016 11:41:44 [name] -------------------------------------------------------------------------------- Encounter Discharge Information Details Patient Name: [name], [name]. Date of Service: 11/20/2016 11:15 AM Medical Record Patient Account Number: [id number] Number: Treating RN: [name], RN, [name], Rita 1945-11-12 [phone] y.o. Other Clinician: Date of Birth/Sex: Female) Treating ROBSON, MICHAEL Primary Care Physician: [name] Physician/Extender: G Referring Physician: Donzetta Sprung in Treatment: 1 Encounter Discharge Information Items Discharge Pain Level: 0 Discharge Condition: Stable Ambulatory Status: Ambulatory Discharge Destination: Home Transportation: Private Auto Accompanied By: self Schedule Follow-up Appointment: No Medication Reconciliation completed and provided to Patient/Care No Brendan Gruwell: Provided on Clinical Summary of Care: 11/20/2016 Form Type Recipient Paper Patient LP Electronic Signature(s) Signed: 11/20/2016 11:50:19 AM By: Ruthine Dose Entered By: Ruthine Dose on 11/20/2016 11:50:19 Riding, [name] -------------------------------------------------------------------------------- Lower Extremity Assessment Details Patient Name: [name], [name]. Date of Service: 11/20/2016 11:15 AM Medical Record Patient Account Number: [id number] Number: Treating RN: [name], RN, [name], Rita [birth year] [phone] y.o. Other Clinician: Date of Birth/Sex: Female) Treating ROBSON, [place] Primary Care Physician: [name] Physician/Extender: G Referring Physician: Donzetta Sprung in Treatment: 1 Edema Assessment Assessed: [Left: No] [Right: No] Edema: [Left: Ye] [Right: s] Calf Left: Right: Point of Measurement: 30 cm From Medial Instep 42.3 cm cm Ankle Left: Right: Point of Measurement: 9 cm From Medial Instep 23.2 cm cm Vascular Assessment Claudication: Claudication Assessment [Left:None] Pulses: Dorsalis Pedis Palpable: [Left:Yes] Posterior Tibial Extremity colors, hair growth, and conditions: Extremity Color: [Left:Mottled] Hair Growth on Extremity: [Left:No] Temperature of Extremity: [Left:Warm] Capillary Refill: [Left:< 3 seconds] Electronic Signature(s) Signed: 11/20/2016 4:59:50 PM By: Regan Lemming [name], RN Entered By: Regan Lemming on 11/20/2016 11:27:54 Dorton, [name]  ([id number]) -------------------------------------------------------------------------------- Multi Wound Chart Details Patient Name: [name], [name]. Date of Service: 11/20/2016 11:15 AM Medical Record Patient Account Number: [id number] Number: Treating RN: [name], RN, [name], Rita October 16, 1946 [phone] y.o. Other Clinician: Date of Birth/Sex: Female) Treating ROBSON, [place] Primary Care Physician: [name] Physician/Extender: G Referring Physician: Donzetta Sprung in Treatment: 1 Vital Signs Height(in): 63 Pulse(bpm): 70 Weight(lbs): 218 Blood Pressure 110/62 (mmHg): Body Mass Index(BMI): 39 Temperature(F): 98  Respiratory Rate 16 (breaths/min): Photos: [2:No Photos] [N/A:N/A] Wound Location: [2:Left Lower Leg - Medial] [N/A:N/A] Wounding Event: [2:Gradually Appeared] [N/A:N/A] Primary Etiology: [2:Atypical] [N/A:N/A] Comorbid History: [2:Cataracts, Asthma, Sleep Apnea, Deep Vein Thrombosis, Hypertension, Peripheral Venous Disease, Osteoarthritis, Received Chemotherapy, Received Radiation] [N/A:N/A] Date Acquired: [2:10/10/2016] [N/A:N/A] Weeks of Treatment: [2:1] [N/A:N/A] Wound Status: [2:Open] [N/A:N/A] Measurements L x W x D 1.5x3x0.1 [N/A:N/A] (cm) Area (cm) : [2:3.534] [N/A:N/A] Volume (cm) : [2:0.353] [N/A:N/A] % Reduction in Area: [2:25.00%] [N/A:N/A] % Reduction in Volume: 25.10% [N/A:N/A] Classification: [2:Partial Thickness] [N/A:N/A] Exudate Amount: [2:Small] [N/A:N/A] Exudate Type: [2:Serous] [N/A:N/A] Exudate Color: [2:amber] [N/A:N/A] Wound Margin: [2:Distinct, outline attached] [N/A:N/A] Granulation Amount: [2:Medium (34-66%)] [N/A:N/A] Granulation Quality: [2:Friable] [N/A:N/A] Necrotic Amount: Small (1-33%) N/A N/A Exposed Structures: Fascia: No N/A N/A Fat: No Tendon: No Muscle: No Joint: No Bone: No Limited to Skin Breakdown Epithelialization: None N/A N/A Periwound Skin Texture: Edema: Yes N/A N/A Excoriation:  No Induration: No Callus: No Crepitus: No Fluctuance: No Friable: No Rash: No Scarring: No Periwound Skin Moist: Yes N/A N/A Moisture: Maceration: No Dry/Scaly: No Periwound Skin Color: Hemosiderin Staining: Yes N/A N/A Mottled: Yes Atrophie Blanche: No Cyanosis: No Ecchymosis: No Erythema: No Pallor: No Rubor: No Temperature: No Abnormality N/A N/A Tenderness on Yes N/A N/A Palpation: Wound Preparation: Ulcer Cleansing: N/A N/A Rinsed/Irrigated with Saline Topical Anesthetic Applied: Other: lidocaine 4% Treatment Notes Wound #2 (Left, Medial Lower Leg) 1. Cleansed with: Clean wound with Normal Saline 4. Dressing Applied: Promogran 5. Secondary Dressing Applied Bordered Foam Dressing JANAISHA, HELLWEGE ([id number]) Dry Gauze Electronic Signature(s) Signed: 11/21/2016 7:43:54 AM By: Linton Ham MD Entered By: Linton Ham on 11/20/2016 12:18:10 KIA, CARANDANG ([id number]) -------------------------------------------------------------------------------- Multi-Disciplinary Care Plan Details Patient Name: [name], [name]. Date of Service: 11/20/2016 11:15 AM Medical Record Patient Account Number: [id number] Number: Treating RN: [name], RN, [name], Rita 12-29-1945 [phone] y.o. Other Clinician: Date of Birth/Sex: Female) Treating ROBSON, MICHAEL Primary Care Physician: [name] Physician/Extender: G Referring Physician: Donzetta Sprung in Treatment: 1 Active Inactive Orientation to the Wound Care Program Nursing Diagnoses: Knowledge deficit related to the wound healing center program Goals: Patient/caregiver will verbalize understanding of the [facility] Program Date Initiated: 11/13/2016 Goal Status: Active Interventions: Provide education on orientation to the wound center Notes: Venous Leg Ulcer Nursing Diagnoses: Knowledge deficit related to disease process and management Potential for venous Insuffiency (use before  diagnosis confirmed) Goals: Patient will maintain optimal edema control Date Initiated: 11/13/2016 Goal Status: Active Patient/caregiver will verbalize understanding of disease process and disease management Date Initiated: 11/13/2016 Goal Status: Active Verify adequate tissue perfusion prior to therapeutic compression application Date Initiated: 11/13/2016 Goal Status: Active Interventions: Assess peripheral edema status every visit. Compression as ordered BABY, EAGLES ([id number]) Provide education on venous insufficiency Notes: Wound/Skin Impairment Nursing Diagnoses: Impaired tissue integrity Knowledge deficit related to ulceration/compromised skin integrity Goals: Patient/caregiver will verbalize understanding of skin care regimen Date Initiated: 11/13/2016 Goal Status: Active Ulcer/skin breakdown will have a volume reduction of 30% by week 4 Date Initiated: 11/13/2016 Goal Status: Active Ulcer/skin breakdown will have a volume reduction of 50% by week 8 Date Initiated: 11/13/2016 Goal Status: Active Ulcer/skin breakdown will have a volume reduction of 80% by week 12 Date Initiated: 11/13/2016 Goal Status: Active Ulcer/skin breakdown will heal within 14 weeks Date Initiated: 11/13/2016 Goal Status: Active Interventions: Assess patient/caregiver ability to obtain necessary supplies Assess patient/caregiver ability to perform ulcer/skin care regimen upon admission and as needed Assess ulceration(s) every visit Provide education on  ulcer and skin care Treatment Activities: Referred to DME Lizania Bouchard for dressing supplies : 11/13/2016 Skin care regimen initiated : 11/13/2016 Topical wound management initiated : 11/13/2016 Notes: Electronic Signature(s) Signed: 11/20/2016 4:59:50 PM By: Regan Lemming [name], RN Entered By: Regan Lemming on 11/20/2016 11:32:25 DEBANHI, VANSKIVER ([id number]) -------------------------------------------------------------------------------- Pain Assessment  Details Patient Name: [name], [name]. Date of Service: 11/20/2016 11:15 AM Medical Record Patient Account Number: [id number] Number: Treating RN: [name], RN, [name], Rita February 10, 1946 [phone] y.o. Other Clinician: Date of Birth/Sex: Female) Treating ROBSON, MICHAEL Primary Care Physician: [name] Physician/Extender: G Referring Physician: Donzetta Sprung in Treatment: 1 Active Problems Location of Pain Severity and Description of Pain Patient Has Paino No Site Locations With Dressing Change: No Pain Management and Medication Current Pain Management: Electronic Signature(s) Signed: 11/20/2016 4:59:50 PM By: Regan Lemming [name], RN Entered By: Regan Lemming on 11/20/2016 11:24:58 [name] ([id number]) -------------------------------------------------------------------------------- Patient/Caregiver Education Details Patient Name: [name], [name]. Date of Service: 11/20/2016 11:15 AM Medical Record Patient Account Number: [id number] Number: Treating RN: [name], RN, [name], Rita 1946/09/22 [phone] y.o. Other Clinician: Date of Birth/Gender: Female) Treating ROBSON, [place] Primary Care Physician: [name] Physician/Extender: G Referring Physician: Donzetta Sprung in Treatment: 1 Education Assessment Education Provided To: Patient Education Topics Provided Venous: Methods: Explain/Verbal Responses: State content correctly Welcome To [facility]: Methods: Explain/Verbal Responses: State content correctly Wound/Skin Impairment: Methods: Explain/Verbal Responses: State content correctly Electronic Signature(s) Signed: 11/20/2016 4:59:50 PM By: Regan Lemming [name], RN Entered By: Regan Lemming on 11/20/2016 11:43:24 Tarleton, [name] ([id number]) -------------------------------------------------------------------------------- Wound Assessment Details Patient Name: [name], [name]. Date of Service: 11/20/2016 11:15 AM Medical Record  Patient Account Number: [id number] Number: Treating RN: [name], RN, [name], Rita [birth year] [phone] y.o. Other Clinician: Date of Birth/Sex: Female) Treating ROBSON, MICHAEL Primary Care Physician: [name] Physician/Extender: G Referring Physician: Donzetta Sprung in Treatment: 1 Wound Status Wound Number: 2 Primary Atypical Etiology: Wound Location: Left Lower Leg - Medial Wound Open Wounding Event: Gradually Appeared Status: Date Acquired: 10/10/2016 Comorbid Cataracts, Asthma, Sleep Apnea, Deep Weeks Of Treatment: 1 History: Vein Thrombosis, Hypertension, Clustered Wound: No Peripheral Venous Disease, Osteoarthritis, Received Chemotherapy, Received Radiation Photos Photo Uploaded By: Regan Lemming on 11/20/2016 17:12:49 Wound Measurements Length: (cm) 1.5 Width: (cm) 3 Depth: (cm) 0.1 Area: (cm) 3.534 Volume: (cm) 0.353 % Reduction in Area: 25% % Reduction in Volume: 25.1% Epithelialization: None Tunneling: No Undermining: No Wound Description Classification: Partial Thickness Wound Margin: Distinct, outline attached Exudate Amount: Small Exudate Type: Serous Exudate Color: amber Foul Odor After Cleansing: No Wound Bed [name], [name]. ([id number]) Granulation Amount: Medium (34-66%) Exposed Structure Granulation Quality: Friable Fascia Exposed: No Necrotic Amount: Small (1-33%) Fat Layer Exposed: No Necrotic Quality: Adherent Slough Tendon Exposed: No Muscle Exposed: No Joint Exposed: No Bone Exposed: No Limited to Skin Breakdown Periwound Skin Texture Texture Color No Abnormalities Noted: No No Abnormalities Noted: No Callus: No Atrophie Blanche: No Crepitus: No Cyanosis: No Excoriation: No Ecchymosis: No Fluctuance: No Erythema: No Friable: No Hemosiderin Staining: Yes Induration: No Mottled: Yes Localized Edema: Yes Pallor: No Rash: No Rubor: No Scarring: No Temperature / Pain Moisture Temperature: No  Abnormality No Abnormalities Noted: No Tenderness on Palpation: Yes Dry / Scaly: No Maceration: No Moist: Yes Wound Preparation Ulcer Cleansing: Rinsed/Irrigated with Saline Topical Anesthetic Applied: Other: lidocaine 4%, Treatment Notes Wound #2 (Left, Medial Lower Leg) 1. Cleansed with: Clean wound with Normal Saline 4. Dressing Applied: Promogran 5. Secondary Dressing Applied  Bordered Foam Dressing Dry Gauze Electronic Signature(s) Signed: 11/20/2016 4:59:50 PM By: Regan Lemming [name], RN Entered By: Regan Lemming on 11/20/2016 11:31:18 GAYNA, BERGAN ([id number]) -------------------------------------------------------------------------------- Vitals Details Patient Name: DAYAMIN, MUNYAN. Date of Service: 11/20/2016 11:15 AM Medical Record Patient Account Number: [id number] Number: Treating RN: [name], RN, [name], Rita 1946-07-03 [phone] y.o. Other Clinician: Date of Birth/Sex: Female) Treating ROBSON, MICHAEL Primary Care Physician: [name] Physician/Extender: G Referring Physician: Donzetta Sprung in Treatment: 1 Vital Signs Time Taken: 11:28 Temperature (F): 98 Height (in): 63 Pulse (bpm): 70 Weight (lbs): 218 Respiratory Rate (breaths/min): 16 Body Mass Index (BMI): 38.6 Blood Pressure (mmHg): 110/62 Reference Range: 80 - 120 mg / dl Electronic Signature(s) Signed: 11/20/2016 4:59:50 PM By: Regan Lemming [name], RN Entered By: Regan Lemming on 11/20/2016 11:28:29

## 2016-11-21 NOTE — Progress Notes (Signed)
**Note De-Identified [name] Obfuscation** Amber, [name] ([id number]) Visit Report for 11/20/2016 Chief Complaint Document Details Patient Name: [name], [name]. Date of Service: 11/20/2016 11:15 AM Medical Record Patient Account Number: [id number] Number: Treating RN: [name], RN, [name], Rita October 02, 1946 [phone] y.o. Other Clinician: Date of Birth/Sex: Female) Treating Barack Nicodemus Primary Care Physician/Extender: Ferd Glassing Physician: Referring Physician: Donzetta Sprung in Treatment: 1 Information Obtained from: Patient Chief Complaint Left calf venous stasis ulceration. 11/13/16; the patient is here for a left calf recurrent ulceration which is painful and has been present for the last month Electronic Signature(s) Signed: 11/21/2016 7:43:54 AM By: Linton Ham MD Entered By: Linton Ham on 11/20/2016 12:18:23 KAMARIYAH, DELIRA ([id number]) -------------------------------------------------------------------------------- HPI Details Patient Name: [name], Amber. Date of Service: 11/20/2016 11:15 AM Medical Record Patient Account Number: [id number] Number: Treating RN: [name], RN, [name], Rita 03-05-1946 [phone] y.o. Other Clinician: Date of Birth/Sex: Female) Treating Beldon Nowling Primary Care Physician/Extender: Ferd Glassing Physician: Referring Physician: Donzetta Sprung in Treatment: 1 History of Present Illness HPI Description: Pleasant 71 year old with history of chronic venous insufficiency. No diabetes or peripheral vascular disease. Left ABI 1.29. Questionable history of left lower extremity DVT. She developed a recurrent ulceration on her left lateral calf in December 2015, which she attributes to poor diet and subsequent lower extremity edema. She underwent endovenous laser ablation of her left greater saphenous vein in 2010. She underwent laser ablation of accessory branch of left GSV in April 2016 by Dr. Kellie Simmering at [facility]. She was previously wearing Unna  boots, which she tolerated well. Tolerating 2 layer compression and cadexomer iodine. She returns to clinic for follow-up and is without new complaints. She denies any significant pain at this time. She reports persistent pain with pressure. No claudication or ischemic rest pain. No fever or chills. No drainage. READMISSION 11/13/16; this is a 71 year old woman who is not a diabetic. She is here for a review of a painful area on her left medial lower extremity. I note that she was seen here previously last year for wound I believe to be in the same area. At that time she had undergone previously a left greater saphenous vein ablation by Dr. Kellie Simmering and she had a ablation of the anterior accessory branch of the left greater saphenous vein in March 2016. Seeing that the wound actually closed over. In reviewing the history with her today the ulcer in this area has been recurrent. She describes a biopsy of this area in 2009 that only showed stasis physiology. 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 Do not see that she has ever had arterial studies in the left leg. She tells me she has a follow-up with Dr. Kellie Simmering in roughly 10 days In any case she developed the reopening of this area roughly a month ago. On the background of this she describes rapidly increasing edema which has responded to Lasix 40 mg and metolazone 2.5 mg as well as the patient's lymph massage. She has been told she has both venous insufficiency and lymphedema but she cannot tolerate compression stockings Electronic Signature(s) Signed: 11/21/2016 7:43:54 AM By: Linton Ham MD Entered By: Linton Ham on 11/20/2016 12:18:30 [name]  ([id number]) -------------------------------------------------------------------------------- Physical Exam Details Patient Name: [name], Amber. Date of  Service: 11/20/2016 11:15 AM Medical Record Patient Account Number: [id number] Number: Treating RN: [name], RN, [name], Rita 09/11/1946 [phone] y.o. Other Clinician: Date of Birth/Sex: Female) Treating Karem Tomaso Primary Care Physician/Extender: Ferd Glassing Physician: Referring Physician: [name] Weeks in Treatment: 1 Constitutional Sitting or standing Blood Pressure is within target range for patient.. Pulse regular and within target range for patient.Marland Kitchen Respirations regular, non-labored and within target range.. Temperature is normal and within the target range for the patient.. Patient's appearance is neat and clean. Appears in no acute distress. Well nourished and well developed.. Eyes Conjunctivae clear. No discharge.. Cardiovascular Pedal pulses palpable and strong bilaterally.. Minimal edema. Gastrointestinal (GI) Abdomen is soft and non-distended without masses or tenderness. Bowel sounds active in all quadrants.. No liver or spleen enlargement or tenderness.. Lymphatic Nonpalpable the popliteal or inguinal area. Psychiatric No evidence of depression, anxiety, or agitation. Calm, cooperative, and communicative. Appropriate interactions and affect.. Notes Wound exam;. Questions on the medial aspect of her left leg. This looks better than last week both in terms of the wounds and also the small petechial areas around the wound. She has had recurrent wounds in this area. She is on diuretic but has no edema currently. Electronic Signature(s) Signed: 11/21/2016 7:43:54 AM By: Linton Ham MD Entered By: Linton Ham on 11/20/2016 12:20:25 [name] -------------------------------------------------------------------------------- Physician Orders Details Patient Name: [name], Amber. Date of Service: 11/20/2016 11:15 AM Medical Record Patient Account Number: [id number] Number: Treating RN: [name], RN, [name], Rita 17-Sep-1946 [phone] y.o. Other Clinician: Date of Birth/Sex: Female) Treating Kmarion Rawl Primary Care Physician/Extender: Ferd Glassing Physician: Referring Physician: Donzetta Sprung in Treatment: 1 Verbal / Phone Orders: Yes Clinician: Afful, RN, [name], Rita Read Back and Verified: Yes Diagnosis Coding Wound Cleansing Wound #2 Left,Medial Lower Leg o Cleanse wound with mild soap and water o May Shower, gently pat wound dry prior to applying new dressing. o May shower with protection. Skin Barriers/Peri-Wound Care Wound #2 Left,Medial Lower Leg o Triamcinolone Acetonide Ointment Primary Wound Dressing Wound #2 Left,Medial Lower Leg o Promogran Secondary Dressing Wound #2 Left,Medial Lower Leg o Gauze and Kerlix/Conform Dressing Change Frequency Wound #2 Left,Medial Lower Leg o Change dressing every other day. Follow-up Appointments Wound #2 Left,Medial Lower Leg o Return Appointment in 1 week. Edema Control Wound #2 Left,Medial Lower Leg o Elevate legs to the level of the heart and pump ankles as often as possible Electronic Signature(s) HELENE, LABO ([id number]) Signed: 11/20/2016 4:59:50 PM By: Regan Lemming [name], RN Signed: 11/21/2016 7:43:54 AM By: Linton Ham MD Entered By: Regan Lemming on 11/20/2016 11:42:09 AUNISTY, EGGEBRECHT ([id number]) -------------------------------------------------------------------------------- Problem List Details Patient Name: SHAWNDELL, SALIB. Date of Service: 11/20/2016 11:15 AM Medical Record Patient Account Number: [id number] Number: Treating RN: [name], RN, [name], Rita 1945-11-27 [phone] y.o. Other Clinician: Date of Birth/Sex: Female) Treating Verenice Westrich Primary Care Physician/Extender: Ferd Glassing Physician: Referring Physician:  Donzetta Sprung in Treatment: 1 Active Problems ICD-10 Encounter Code Description Active Date Diagnosis L97.221 Non-pressure chronic ulcer of left calf limited to 11/13/2016 Yes breakdown of skin I87.332 Chronic venous hypertension (idiopathic) with ulcer and 11/13/2016 Yes inflammation of left lower extremity I89.0 Lymphedema, not elsewhere classified 11/13/2016 Yes Inactive Problems Resolved Problems Electronic Signature(s) Signed: 11/21/2016 7:43:54 AM By: Linton Ham MD Entered By: Linton Ham on 11/20/2016 12:15:48 [name] -------------------------------------------------------------------------------- Progress Note Details Patient Name: VEEDA, RUDOLF. Date of Service: 11/20/2016 11:15 AM Medical Record Patient Account  Number: [id number] Number: Treating RN: [name] RN, [name], Rita Dec 23, 1945 [phone] y.o. Other Clinician: Date of Birth/Sex: Female) Treating Indea Dearman Primary Care Physician/Extender: Ferd Glassing Physician: Referring Physician: Donzetta Sprung in Treatment: 1 Subjective Chief Complaint Information obtained from Patient Left calf venous stasis ulceration. 11/13/16; the patient is here for a left calf recurrent ulceration which is painful and has been present for the last month History of Present Illness (HPI) Pleasant 71 year old with history of chronic venous insufficiency. No diabetes or peripheral vascular disease. Left ABI 1.29. Questionable history of left lower extremity DVT. She developed a recurrent ulceration on her left lateral calf in December 2015, which she attributes to poor diet and subsequent lower extremity edema. She underwent endovenous laser ablation of her left greater saphenous vein in 2010. She underwent laser ablation of accessory branch of left GSV in April 2016 by Dr. Kellie Simmering at [facility]. She was previously wearing Unna boots, which she tolerated well. Tolerating 2 layer  compression and cadexomer iodine. She returns to clinic for follow-up and is without new complaints. She denies any significant pain at this time. She reports persistent pain with pressure. No claudication or ischemic rest pain. No fever or chills. No drainage. READMISSION 11/13/16; this is a 71 year old woman who is not a diabetic. She is here for a review of a painful area on her left medial lower extremity. I note that she was seen here previously last year for wound I believe to be in the same area. At that time she had undergone previously a left greater saphenous vein ablation by Dr. Kellie Simmering and she had a ablation of the anterior accessory branch of the left greater saphenous vein in March 2016. Seeing that the wound actually closed over. In reviewing the history with her today the ulcer in this area has been recurrent. She describes a biopsy of this area in 2009 that only showed stasis physiology. 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 Do not see that she has ever had arterial studies in the left leg. She tells me she has a follow-up with Dr. Kellie Simmering in roughly 10 days In any case she developed the reopening of this area roughly a month ago. On the background of this she describes rapidly increasing edema which has responded to Lasix 40 mg and metolazone 2.5 mg as well as the patient's lymph massage. She has been told she has both venous insufficiency and lymphedema but she cannot tolerate compression stockings SERENE, BASTOW. ([id number]) Objective Constitutional Sitting or standing Blood Pressure is within target range for patient.. Pulse regular and within target range for patient.Marland Kitchen Respirations regular, non-labored and within target range.. Temperature is normal and  within the target range for the patient.. Patient's appearance is neat and clean. Appears in no acute distress. Well nourished and well developed.. Vitals Time Taken: 11:28 AM, Height: 63 in, Weight: 218 lbs, BMI: 38.6, Temperature: 98 F, Pulse: 70 bpm, Respiratory Rate: 16 breaths/min, Blood Pressure: 110/62 mmHg. Eyes Conjunctivae clear. No discharge.. Cardiovascular Pedal pulses palpable and strong bilaterally.. Minimal edema. Gastrointestinal (GI) Abdomen is soft and non-distended without masses or tenderness. Bowel sounds active in all quadrants.. No liver or spleen enlargement or tenderness.. Lymphatic Nonpalpable the popliteal or inguinal area. Psychiatric No evidence of  depression, anxiety, or agitation. Calm, cooperative, and communicative. Appropriate interactions and affect.. General Notes: Wound exam;. Questions on the medial aspect of her left leg. This looks better than last week both in terms of the wounds and also the small petechial areas around the wound. She has had recurrent wounds in this area. She is on diuretic but has no edema currently. Integumentary (Hair, Skin) Wound #2 status is Open. Original cause of wound was Gradually Appeared. The wound is located on the Left,Medial Lower Leg. The wound measures 1.5cm length x 3cm width x 0.1cm depth; 3.534cm^2 area and 0.353cm^3 volume. The wound is limited to skin breakdown. There is no tunneling or undermining noted. There is a small amount of serous drainage noted. The wound margin is distinct with the outline attached to the wound base. There is medium (34-66%) friable granulation within the wound bed. There is a small (1-33%) amount of necrotic tissue within the wound bed including Adherent Slough. The periwound skin appearance exhibited: Localized Edema, Moist, Hemosiderin Staining, Mottled. The periwound skin appearance did not exhibit: Callus, Crepitus, Excoriation, Fluctuance, Friable, Induration, Rash,  Scarring, Dry/Scaly, Maceration, Atrophie Blanche, Cyanosis, Ecchymosis, Pallor, Rubor, Erythema. ANNALEY, FISHBAUGH. ([id number]) temperature was noted as No Abnormality. The periwound has tenderness on palpation. Assessment Active Problems ICD-10 L97.221 - Non-pressure chronic ulcer of left calf limited to breakdown of skin I87.332 - Chronic venous hypertension (idiopathic) with ulcer and inflammation of left lower extremity I89.0 - Lymphedema, not elsewhere classified Plan Wound Cleansing: Wound #2 Left,Medial Lower Leg: Cleanse wound with mild soap and water May Shower, gently pat wound dry prior to applying new dressing. May shower with protection. Skin Barriers/Peri-Wound Care: Wound #2 Left,Medial Lower Leg: Triamcinolone Acetonide Ointment Primary Wound Dressing: Wound #2 Left,Medial Lower Leg: Promogran Secondary Dressing: Wound #2 Left,Medial Lower Leg: Gauze and Kerlix/Conform Dressing Change Frequency: Wound #2 Left,Medial Lower Leg: Change dressing every other day. Follow-up Appointments: Wound #2 Left,Medial Lower Leg: Return Appointment in 1 week. Edema Control: Wound #2 Left,Medial Lower Leg: Elevate legs to the level of the heart and pump ankles as often as possible ONIE, EDWARDSON. ([id number]) #1 continue triamcinolone, Prisma, Kerlix and conform #2 she brought the results to show me the workup she had for edema which included an echocardiogram with a normal ejection fraction at 0000000 grade 1 diastolic relaxation. She had slightly elevated right-sided pressures but not enough to cause edema. She did also have a CT scan of her abdomen and pelvis. This showed inguinal adenopathy and apparently Dr. Lelon Frohlich of her is going to repeat the CT scan in early March. All of this was done because of severe bilateral lower extremity edema but this is gone down with diuretic Electronic Signature(s) Signed: 11/21/2016 7:43:54 AM By: Linton Ham MD Entered By:  Linton Ham on 11/20/2016 12:22:07 KASHIYA, MEEUWSEN ([id number]) -------------------------------------------------------------------------------- SuperBill Details Patient Name: JONAS, SCARPITTI. Date of Service: 11/20/2016 Medical Record Patient Account Number: [id number] Number: Treating RN: [name], RN, [name], Rita Apr 20, [phone] y.o. Other Clinician: Date of Birth/Sex: Female) Treating Adenike Shidler Primary Care Physician/Extender: Ferd Glassing Physician: Weeks in Treatment: 1 Referring Physician: [name] Diagnosis Coding ICD-10 Codes Code Description [id number] Non-pressure chronic ulcer of left calf limited to breakdown of skin Chronic venous hypertension (idiopathic) with ulcer and inflammation of left lower I87.332 extremity I89.0 Lymphedema, not elsewhere classified Facility Procedures CPT4 Code: [id number] Description: [id number] - WOUND CARE VISIT-LEV 2 EST PT Modifier: Quantity: 1 Physician Procedures CPT4: Description Modifier Quantity Code  [id number] - WC PHYS LEVEL 3 - EST PT 1 ICD-10 Description Diagnosis L97.221 Non-pressure chronic ulcer of left calf limited to breakdown of skin I87.332 Chronic venous hypertension (idiopathic) with ulcer and  inflammation of left lower extremity Electronic Signature(s) Signed: 11/21/2016 7:43:54 AM By: Linton Ham MD Entered By: Linton Ham on 11/20/2016 12:22:33

## 2016-11-22 ENCOUNTER — Encounter (HOSPITAL_COMMUNITY): Payer: Medicare Other

## 2016-11-22 ENCOUNTER — Encounter: Payer: Self-pay | Admitting: Vascular Surgery

## 2016-11-23 ENCOUNTER — Encounter: Payer: Medicare Other | Admitting: Occupational Therapy

## 2016-11-26 DIAGNOSIS — Z85820 Personal history of malignant melanoma of skin: Secondary | ICD-10-CM | POA: Diagnosis not present

## 2016-11-26 DIAGNOSIS — I83222 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 of calf and inflammation: Secondary | ICD-10-CM | POA: Diagnosis not present

## 2016-11-26 DIAGNOSIS — Z85828 Personal history of other malignant neoplasm of skin: Secondary | ICD-10-CM | POA: Diagnosis not present

## 2016-11-27 ENCOUNTER — Encounter: Payer: Self-pay | Admitting: Vascular Surgery

## 2016-11-27 ENCOUNTER — Ambulatory Visit (INDEPENDENT_AMBULATORY_CARE_PROVIDER_SITE_OTHER): Payer: Medicare Other | Admitting: Vascular Surgery

## 2016-11-27 VITALS — BP 116/73 | HR 78 | Temp 98.6°F | Resp 16 | Ht 63.0 in | Wt 212.0 lb

## 2016-11-27 DIAGNOSIS — I83892 Varicose veins of left lower extremities with other complications: Secondary | ICD-10-CM | POA: Diagnosis not present

## 2016-11-27 NOTE — Progress Notes (Signed)
Subjective:     Patient ID: [name], female   DOB: May 23, 1946, 71 y.o.   [name]  HPI This 71 year old female was referred by the wound center for evaluation of ulcer left leg. Patient has a history of laser ablation left great saphenous vein in [place] about 5 years ago and laser ablation of left anterior accessory branch great saphenous vein by me 2 years ago. Patient had residual varicosities thigh and lateral calf at that time but never return for further treatment after her follow-up. As a remote history of melanoma having been removed for right upper extremity with no dissection approximately 2011. Followed by Dr. Charlesetta Garibaldi in the oncology clinic and is currently being followed in the wound center for this ulcer in the left leg which occurred a few weeks ago. She has also been seen by dermatology. There was some discussion about this being a venous ulcer versus arteritis but it was not thought to represent melanoma.  Past Medical History:  Diagnosis Date  . Anesthesia complication    trouble waking up 2/2 CPAP  . Arthritis   . Asthma in adult   . Cervical spondylosis 2012   Jacelyn Grip, [name])  . Choroidal nevus of left eye 03/22/2016  . Chronic venous insufficiency 2016   severe reflux with painful varicosities sees [facility]  . Complication of anesthesia    difficulty coming out due to sleep apnea per pt  . Difficult intubation    PATIENT DENIES   . DVT (deep venous thrombosis) ([place]) 2011   small, developed after venous ablation  . Family history of adverse reaction to anesthesia    O2 levels drop upon waking   . Hearing loss sensory, bilateral 2013   mod-severe high freq sensorineural ([facility])  . History of kidney stones 1980s  . History of radiation therapy 07/19/11-08/25/2011   RIGHT AXILLARY REGION/METASTATIC  . Hypertension   . Kidney stones   . Melanoma ([place]) 06/29/10   MALIGNANT MELANOMA R SHOULDER/SUPRASCAPULAR BACK s/p interferon chemo and XRT  .  Neuromuscular disorder ([facility])    muscle spasms  . OSA (obstructive sleep apnea)    on CPAP  . Pneumonia    2001  . RLS (restless legs syndrome)   . Seasonal allergies   . Tinnitus   . Unspecified vitamin D deficiency 03/18/2013    Social History  Substance Use Topics  . Smoking status: Former Smoker    Types: Cigarettes    Quit date: 11/05/1965  . Smokeless tobacco: Never Used     Comment: socially as a teen  . Alcohol use No    Family History  Problem Relation Age of Onset  . Cancer Mother 50    uterine  . CAD Father 26    MI  . Hypertension Father 67  . Alzheimer's disease Father 83  . Cancer Cousin     x2, breast  . Diabetes Sister   . Cancer Paternal Grandmother     melanoma, possibly    Allergies  Allergen Reactions  . Sulfa Antibiotics Itching and Swelling    Facial swelling  . Latex Other (See Comments)    Blisters ONLY HAD REACTION TO TAPE  (??? ONLY ADHESIVE ALLERGY)  . Tape Other (See Comments)    Caused blisters - must use paper tape - same reaction from [employer]     Current Outpatient Prescriptions:  .  acetaminophen (TYLENOL ARTHRITIS PAIN) 650 MG CR tablet, Take 1,300 mg by mouth every 8 (eight) hours  as needed for pain. , Disp: , Rfl:  .  aspirin EC 81 MG tablet, Take 81 mg by mouth at bedtime. Melanoma prevention, Disp: , Rfl:  .  Fexofenadine HCl (ALLERGY 24-HR PO), Take by mouth., Disp: , Rfl:  .  furosemide (LASIX) 40 MG tablet, Take 1 tablet (40 mg total) by mouth daily., Disp: 30 tablet, Rfl: 3 .  gabapentin (NEURONTIN) 100 MG capsule, Take 1 capsule (100 mg total) by mouth at bedtime., Disp: 30 capsule, Rfl: 3 .  KLOR-CON 10 10 MEQ tablet, TAKE 2 TABLETS BY MOUTH DAILY., Disp: 180 tablet, Rfl: 3 .  losartan (COZAAR) 50 MG tablet, Take 1 tablet (50 mg total) by mouth daily., Disp: 30 tablet, Rfl: 3 .  metolazone (ZAROXOLYN) 2.5 MG tablet, Take 1 tablet daily 1 hour prior to furosemide., Disp: 30 tablet, Rfl: 4 .  montelukast (SINGULAIR) 10 MG  tablet, TAKE 1 TABLET BY MOUTH DAILY, Disp: 30 tablet, Rfl: 6 .  Multiple Vitamin (MULTIVITAMIN WITH MINERALS) TABS tablet, Take 1 tablet by mouth daily., Disp: , Rfl:  .  Polyvinyl Alcohol-Povidone (TEARS PLUS OP), Place 1 drop into both eyes daily as needed (dry eyes/ redness/ burning)., Disp: , Rfl:  .  PRESCRIPTION MEDICATION, CPAP, Disp: , Rfl:  .  PROAIR HFA 108 (90 Base) MCG/ACT inhaler, INHALE 2 PUFFS INTO LUNGS EVERY 6 HOURS AS NEEDED FOR WHEEZING OR SHORTNESS OF BREATH, Disp: 8.5 Inhaler, Rfl: 3 .  Probiotic Product (PROBIOTIC DAILY PO), Take 1 tablet by mouth daily. , Disp: , Rfl:  .  Vitamin D, Ergocalciferol, (DRISDOL) 50000 units CAPS capsule, TAKE 1 CAPSULE (50,000 UNITS TOTAL) BY MOUTH EVERY 7 (SEVEN) DAYS., Disp: 4 capsule, Rfl: 3  Vitals:   11/27/16 1052  BP: 116/73  Pulse: 78  Resp: 16  Temp: 98.6 F (37 C)  SpO2: 99%  Weight: 212 lb (96.2 kg)  Height: 5\' 3"  (1.6 m)    Body mass index is 37.55 kg/m.         Review of Systems His history of cervical spondylosis lying surgery. He history of present illness recent CT scan reveals some small adenopathy in the left inguinal area    Objective:   Physical Exam BP 116/73   Pulse 78   Temp 98.6 F (37 C)   Resp 16   Ht 5\' 3"  (1.6 m)   Wt 212 lb (96.2 kg)   SpO2 99%   BMI 37.55 kg/m   Gen. obese well-nourished female no apparent distress alert and oriented 3 Left lower extremity with bulging varicosities in the anterior thigh distally extending lateral to the knee and the lateral calf. 1+ chronic edema. Reticular veins in the bilateral malleolar areas. 1 x 2 cm ulcerated area in scar tissue on the medial aspect left lower leg with no pigmentation noted  Today I reviewed a ultrasound report from November 2017 which revealed no evidence of DVT  Also performed a bedside ultrasound sono site exam today which revealed closure of the left great saphenous and anterior accessory branch of the great saphenous vein  with a lot of varicosities originating from this area  I also listened with arterial Doppler and there is brisk flow in the left anterior tibial and triphasic flow in the left posterior tibial arteries    Assessment:     New ulcer left leg suspect venous stasis ulcer with chronic edema No evidence of severe arterial insufficiency    Plan:     Recommend treating this is a  typical venous stasis ulcer and continue input from wound center and dermatology versus other etiologies No evidence of arterial insufficiency as a cause and patient does have chronic venous insufficiency Return to see me on a when necessary basis

## 2016-11-28 ENCOUNTER — Encounter: Payer: Medicare Other | Admitting: Occupational Therapy

## 2016-11-28 ENCOUNTER — Encounter: Payer: Medicare Other | Admitting: Internal Medicine

## 2016-11-28 DIAGNOSIS — I87332 Chronic venous hypertension (idiopathic) with ulcer and inflammation of left lower extremity: Secondary | ICD-10-CM | POA: Diagnosis not present

## 2016-11-28 DIAGNOSIS — L97221 Non-pressure chronic ulcer of left calf limited to breakdown of skin: Secondary | ICD-10-CM | POA: Diagnosis not present

## 2016-11-28 DIAGNOSIS — I87312 Chronic venous hypertension (idiopathic) with ulcer of left lower extremity: Secondary | ICD-10-CM | POA: Diagnosis not present

## 2016-11-28 DIAGNOSIS — Z882 Allergy status to sulfonamides status: Secondary | ICD-10-CM | POA: Diagnosis not present

## 2016-11-28 DIAGNOSIS — I10 Essential (primary) hypertension: Secondary | ICD-10-CM | POA: Diagnosis not present

## 2016-11-28 DIAGNOSIS — Z87891 Personal history of nicotine dependence: Secondary | ICD-10-CM | POA: Diagnosis not present

## 2016-11-28 DIAGNOSIS — I890 Lymphedema, not elsewhere classified: Secondary | ICD-10-CM | POA: Diagnosis not present

## 2016-11-29 DIAGNOSIS — I10 Essential (primary) hypertension: Secondary | ICD-10-CM | POA: Diagnosis not present

## 2016-11-29 DIAGNOSIS — L97221 Non-pressure chronic ulcer of left calf limited to breakdown of skin: Secondary | ICD-10-CM | POA: Diagnosis not present

## 2016-11-29 DIAGNOSIS — I890 Lymphedema, not elsewhere classified: Secondary | ICD-10-CM | POA: Diagnosis not present

## 2016-11-29 DIAGNOSIS — Z882 Allergy status to sulfonamides status: Secondary | ICD-10-CM | POA: Diagnosis not present

## 2016-11-29 DIAGNOSIS — Z87891 Personal history of nicotine dependence: Secondary | ICD-10-CM | POA: Diagnosis not present

## 2016-11-29 DIAGNOSIS — I87332 Chronic venous hypertension (idiopathic) with ulcer and inflammation of left lower extremity: Secondary | ICD-10-CM | POA: Diagnosis not present

## 2016-11-30 ENCOUNTER — Encounter: Payer: Medicare Other | Admitting: Occupational Therapy

## 2016-11-30 NOTE — Progress Notes (Signed)
[name], [name] ([id number]) Visit Report for 11/29/2016 Arrival Information Details Patient Name: [name], [name]. Date of Service: 11/29/2016 10:15 AM Medical Record Number: [id number] Patient Account Number: [id number] Date of Birth/Sex: 27-Sep-1946 (71 y.o. Female) Treating RN: Cornell Barman Primary Care [name]: [name] Other Clinician: Referring [name]: [name] Treating [name]/Extender: Frann Rider in Treatment: 2 Visit Information History Since Last Visit Added or deleted any medications: No Patient Arrived: Ambulatory Any new allergies or adverse reactions: No Arrival Time: 10:23 Had a fall or experienced change in No Accompanied By: self activities of daily living that may affect Transfer Assistance: None risk of falls: Patient Identification Verified: Yes Signs or symptoms of abuse/neglect since last No Secondary Verification Process Yes visito Completed: Hospitalized since last visit: No Patient Requires Transmission-Based No Has Dressing in Place as Prescribed: Yes Precautions: Has Compression in Place as Prescribed: Yes Patient Has Alerts: No Pain Present Now: No Electronic Signature(s) Signed: 11/29/2016 3:30:03 PM By: [name], RN, [name], [name], [name] Entered By: [name], RN, [name], Kim on 11/29/2016 10:23:42 [name] -------------------------------------------------------------------------------- Compression Therapy Details Patient Name: [name], [name]. Date of Service: 11/29/2016 10:15 AM Medical Record Number: [id number] Patient Account Number: [id number] Date of Birth/Sex: [birth year] (71 y.o. Female) Treating RN: Cornell Barman Primary Care Ural Acree: [name] Other Clinician: Referring Lynnex Fulp: [name] Treating Jeaneane Adamec/Extender: Frann Rider in Treatment: 2 Compression Therapy Performed for Wound Wound #2 Left,Medial Lower Leg Assessment: Performed By: Clinician Cornell Barman, RN Compression  Type: Three Hydrologist) Signed: 11/29/2016 3:30:03 PM By: [name], RN, [name], [name], [name] Entered By: [name], RN, [name], Kim on 11/29/2016 10:42:26 [name] -------------------------------------------------------------------------------- Encounter Discharge Information Details Patient Name: [name], [name]. Date of Service: 11/29/2016 10:15 AM Medical Record Number: [id number] Patient Account Number: [id number] Date of Birth/Sex: May 07, 1946 (71 y.o. Female) Treating RN: Cornell Barman Primary Care Krystal Teachey: [name] Other Clinician: Referring Porschia Willbanks: [name] Treating Zyere Jiminez/Extender: Frann Rider in Treatment: 2 Encounter Discharge Information Items Discharge Pain Level: 0 Discharge Condition: Stable Ambulatory Status: Ambulatory Discharge Destination: Home Private Transportation: Auto Accompanied By: self Schedule Follow-up Appointment: No Medication Reconciliation completed and No provided to Patient/Care Tekila Caillouet: Clinical Summary of Care: Electronic Signature(s) Signed: 11/29/2016 3:30:03 PM By: [name], RN, [name], [name], [name] Entered By: [name], RN, [name], Kim on 11/29/2016 10:46:13 [name] -------------------------------------------------------------------------------- General Visit Notes Details Patient Name: [name], [name]. Date of Service: 11/29/2016 10:15 AM Medical Record Number: [id number] Patient Account Number: [id number] Date of Birth/Sex: [birth year] (71 y.o. Female) Treating RN: Cornell Barman Primary Care Tiffanee Mcnee: [name] Other Clinician: Referring Master Touchet: [name] Treating Anyelin Mogle/Extender: Frann Rider in Treatment: 2 Notes Patient came in today with wrap wet from increased drainage from her wound. Re-wrapped patient. Will update HHRN orders to include re-wrapping once before next MD visit. Electronic Signature(s) Signed: 11/29/2016 3:30:03 PM By: [name], RN,  [name], [name], [name] Entered By: [name], RN, [name], Kim on 11/29/2016 10:47:50 [name] -------------------------------------------------------------------------------- Patient/Caregiver Education Details Patient Name: [name], [name]. Date of Service: 11/29/2016 10:15 AM Medical Record Number: [id number] Patient Account Number: [id number] Date of Birth/Gender: [birth year] (71 y.o. Female) Treating RN: Cornell Barman Primary Care Physician: [name] Other Clinician: Referring Physician: [name] Treating Physician/Extender: Frann Rider in Treatment: 2 Education Assessment Education Provided To: Patient Education Topics Provided Venous: Handouts: Controlling Swelling with Multilayered Compression Wraps Methods: Demonstration, Explain/Verbal Responses: State content correctly Electronic Signature(s) Signed: 11/29/2016 3:30:03 PM By: [name],  RN, [name], Leisure centre manager, [name] Entered By: [name], RN, [name], Kim on 11/29/2016 10:45:56 [name] -------------------------------------------------------------------------------- Wound Assessment Details Patient Name: [name], [name]. Date of Service: 11/29/2016 10:15 AM Medical Record Number: [id number] Patient Account Number: [id number] Date of Birth/Sex: 1946-11-02 (71 y.o. Female) Treating RN: Cornell Barman Primary Care Shamonica Schadt: [name] Other Clinician: Referring Gianluca Chhim: [name] Treating Issabelle Mcraney/Extender: Frann Rider in Treatment: 2 Wound Status Wound Number: 2 Primary Atypical Etiology: Wound Location: Left Lower Leg - Medial Wound Open Wounding Event: Gradually Appeared Status: Date Acquired: 10/10/2016 Comorbid Cataracts, Asthma, Sleep Apnea, Deep Weeks Of Treatment: 2 History: Vein Thrombosis, Hypertension, Clustered Wound: No Peripheral Venous Disease, Osteoarthritis, Received Chemotherapy, Received Radiation Photos Wound Measurements Length: (cm) 1 Width:  (cm) 2 Depth: (cm) 0.1 Area: (cm) 1.571 Volume: (cm) 0.157 % Reduction in Area: 66.7% % Reduction in Volume: 66.7% Epithelialization: None Tunneling: No Undermining: No Wound Description Classification: Partial Thickness Wound Margin: Distinct, outline attached Exudate Amount: Small Exudate Type: Serous Exudate Color: amber Foul Odor After Cleansing: No Wound Bed Granulation Amount: Large (67-100%) Exposed Structure Granulation Quality: Friable Fascia Exposed: No Necrotic Amount: None Present (0%) Fat Layer (Subcutaneous Tissue) Exposed: No Tendon Exposed: No Muscle Exposed: No [name], [name]. ([id number]) Joint Exposed: No Bone Exposed: No Limited to Skin Breakdown Periwound Skin Texture Texture Color No Abnormalities Noted: No No Abnormalities Noted: No Callus: No Atrophie Blanche: Yes Crepitus: No Cyanosis: No Excoriation: No Ecchymosis: No Induration: No Erythema: No Rash: No Hemosiderin Staining: Yes Scarring: No Mottled: Yes Pallor: No Moisture Rubor: No No Abnormalities Noted: No Dry / Scaly: No Temperature / Pain Maceration: No Temperature: No Abnormality Tenderness on Palpation: Yes Wound Preparation Ulcer Cleansing: Rinsed/Irrigated with Saline Topical Anesthetic Applied: Other: lidocaine 4%, Assessment Notes wound dripping Treatment Notes Wound #2 (Left, Medial Lower Leg) 1. Cleansed with: Clean wound with Normal Saline 4. Dressing Applied: Prisma Ag 5. Secondary Dressing Applied ABD Pad 7. Secured with 3 Layer Compression System - Left Lower Extremity Notes drawtex Electronic Signature(s) Signed: 11/29/2016 3:30:03 PM By: [name], RN, [name], [name], [name] Entered By: [name], RN, [name], Kim on 11/29/2016 10:32:00

## 2016-12-01 DIAGNOSIS — I890 Lymphedema, not elsewhere classified: Secondary | ICD-10-CM | POA: Diagnosis not present

## 2016-12-01 DIAGNOSIS — M419 Scoliosis, unspecified: Secondary | ICD-10-CM | POA: Diagnosis not present

## 2016-12-01 DIAGNOSIS — M40203 Unspecified kyphosis, cervicothoracic region: Secondary | ICD-10-CM | POA: Diagnosis not present

## 2016-12-01 DIAGNOSIS — M4713 Other spondylosis with myelopathy, cervicothoracic region: Secondary | ICD-10-CM | POA: Diagnosis not present

## 2016-12-01 DIAGNOSIS — I10 Essential (primary) hypertension: Secondary | ICD-10-CM | POA: Diagnosis not present

## 2016-12-01 DIAGNOSIS — I872 Venous insufficiency (chronic) (peripheral): Secondary | ICD-10-CM | POA: Diagnosis not present

## 2016-12-02 ENCOUNTER — Other Ambulatory Visit: Payer: Self-pay | Admitting: Family Medicine

## 2016-12-03 ENCOUNTER — Encounter: Payer: Medicare Other | Admitting: Occupational Therapy

## 2016-12-04 ENCOUNTER — Encounter: Payer: Medicare Other | Admitting: Internal Medicine

## 2016-12-04 ENCOUNTER — Encounter: Payer: Medicare Other | Admitting: Occupational Therapy

## 2016-12-04 DIAGNOSIS — Z87891 Personal history of nicotine dependence: Secondary | ICD-10-CM | POA: Diagnosis not present

## 2016-12-04 DIAGNOSIS — Z882 Allergy status to sulfonamides status: Secondary | ICD-10-CM | POA: Diagnosis not present

## 2016-12-04 DIAGNOSIS — I10 Essential (primary) hypertension: Secondary | ICD-10-CM | POA: Diagnosis not present

## 2016-12-04 DIAGNOSIS — L97221 Non-pressure chronic ulcer of left calf limited to breakdown of skin: Secondary | ICD-10-CM | POA: Diagnosis not present

## 2016-12-04 DIAGNOSIS — I87312 Chronic venous hypertension (idiopathic) with ulcer of left lower extremity: Secondary | ICD-10-CM | POA: Diagnosis not present

## 2016-12-04 DIAGNOSIS — I890 Lymphedema, not elsewhere classified: Secondary | ICD-10-CM | POA: Diagnosis not present

## 2016-12-04 DIAGNOSIS — I87332 Chronic venous hypertension (idiopathic) with ulcer and inflammation of left lower extremity: Secondary | ICD-10-CM | POA: Diagnosis not present

## 2016-12-05 NOTE — Progress Notes (Signed)
[name] Visit Report for 12/04/2016 Arrival Information Details Patient Name: [name], [name]. Date of Service: 12/04/2016 3:30 PM Medical Record Number: [id number] Patient Account Number: [id number] Date of Birth/Sex: 25-Mar-1946 (71 y.o. Female) Treating RN: Afful, RN, [name], Velva Harman Primary Care Sharonann Malbrough: [name] Other Clinician: Referring Erian Lariviere: [name] Treating Muhamad Serano/Extender: Tito Dine in Treatment: 3 Visit Information History Since Last Visit All ordered tests and consults were completed: No Patient Arrived: Ambulatory Added or deleted any medications: No Arrival Time: 15:31 Any new allergies or adverse reactions: No Accompanied By: self Had a fall or experienced change in No Transfer Assistance: None activities of daily living that may affect Patient Identification Verified: Yes risk of falls: Secondary Verification Process Yes Signs or symptoms of abuse/neglect since last No Completed: visito Patient Requires Transmission-Based No Hospitalized since last visit: No Precautions: Has Dressing in Place as Prescribed: Yes Patient Has Alerts: No Has Compression in Place as Prescribed: Yes Pain Present Now: No Electronic Signature(s) Signed: 12/04/2016 4:52:27 PM By: Regan Lemming [name], RN Entered By: Regan Lemming on 12/04/2016 15:31:44 [name] ([id number]) -------------------------------------------------------------------------------- Encounter Discharge Information Details Patient Name: [name]. Date of Service: 12/04/2016 3:30 PM Medical Record Number: [id number] Patient Account Number: [id number] Date of Birth/Sex: [birth year] (71 y.o. Female) Treating RN: Afful, RN, [name], Velva Harman Primary Care Jaxten Brosh: [name] Other Clinician: Referring Dazja Houchin: [name] Treating Emanuel Dowson/Extender: Tito Dine in Treatment: 3 Encounter Discharge Information Items Discharge Pain Level:  0 Discharge Condition: Stable Ambulatory Status: Ambulatory Discharge Destination: Home Transportation: Private Auto Accompanied By: self Schedule Follow-up Appointment: No Medication Reconciliation completed and provided to Patient/Care No Hiroyuki Ozanich: Provided on Clinical Summary of Care: 12/04/2016 Form Type Recipient Paper Patient LP Electronic Signature(s) Signed: 12/04/2016 4:42:44 PM By: Regan Lemming [name], RN Previous Signature: 12/04/2016 4:21:50 PM Version By: Ruthine Dose Entered By: Regan Lemming on 12/04/2016 16:42:44 Laye, [name] ([id number]) -------------------------------------------------------------------------------- Lower Extremity Assessment Details Patient Name: [name]. Date of Service: 12/04/2016 3:30 PM Medical Record Number: [id number] Patient Account Number: [id number] Date of Birth/Sex: October 13, 1946 (71 y.o. Female) Treating RN: Afful, RN, [name], [place] Primary Care Noel Rodier: [name] Other Clinician: Referring Danielly Ackerley: [name] Treating Lyriq Jarchow/Extender: Tito Dine in Treatment: 3 Edema Assessment Assessed: [Left: No] [Right: No] Edema: [Left: N] [Right: o] Calf Left: Right: Point of Measurement: 30 cm From Medial Instep 46 cm cm Ankle Left: Right: Point of Measurement: 9 cm From Medial Instep 24 cm cm Vascular Assessment Claudication: Claudication Assessment [Left:None] Pulses: Dorsalis Pedis Palpable: [Left:Yes] Posterior Tibial Extremity colors, hair growth, and conditions: Extremity Color: [Left:Mottled] Hair Growth on Extremity: [Left:Yes] Temperature of Extremity: [Left:Warm] Capillary Refill: [Left:< 3 seconds] Toe Nail Assessment Left: Right: Thick: Yes Discolored: Yes Deformed: No Improper Length and Hygiene: No Electronic Signature(s) Signed: 12/04/2016 4:52:27 PM By: Regan Lemming [name], RN Entered By: Regan Lemming on 12/04/2016 15:42:55 BANESSA, CRINCOLI ([id number]) Goodin, [name]  ([id number]) -------------------------------------------------------------------------------- Multi Wound Chart Details Patient Name: [name]. Date of Service: 12/04/2016 3:30 PM Medical Record Number: [id number] Patient Account Number: [id number] Date of Birth/Sex: January 26, 1946 (71 y.o. Female) Treating RN: Afful, RN, [name], Velva Harman Primary Care Eliyah Mcshea: [name] Other Clinician: Referring Brennan Karam: [name] Treating Kaoru Rezendes/Extender: Tito Dine in Treatment: 3 Vital Signs Height(in): 63 Pulse(bpm): 72 Weight(lbs): 218 Blood Pressure 108/65 (mmHg): Body Mass Index(BMI): 39 Temperature(F): 98.1 Respiratory Rate 16 (breaths/min): Photos: [2:No Photos] [N/A:N/A] Wound Location: [2:Left Lower Leg - Medial] [N/A:N/A] Wounding Event: [2:Gradually  Appeared] [N/A:N/A] Primary Etiology: [2:Atypical] [N/A:N/A] Comorbid History: [2:Cataracts, Asthma, Sleep Apnea, Deep Vein Thrombosis, Hypertension, Peripheral Venous Disease, Osteoarthritis, Received Chemotherapy, Received Radiation] [N/A:N/A] Date Acquired: [2:10/10/2016] [N/A:N/A] Weeks of Treatment: [2:3] [N/A:N/A] Wound Status: [2:Open] [N/A:N/A] Measurements L x W x D 0.5x1x0.1 [N/A:N/A] (cm) Area (cm) : [2:0.393] [N/A:N/A] Volume (cm) : [2:0.039] [N/A:N/A] % Reduction in Area: [2:91.70%] [N/A:N/A] % Reduction in Volume: 91.70% [N/A:N/A] Classification: [2:Partial Thickness] [N/A:N/A] Exudate Amount: [2:Small] [N/A:N/A] Exudate Type: [2:Serous] [N/A:N/A] Exudate Color: [2:amber] [N/A:N/A] Wound Margin: [2:Distinct, outline attached] [N/A:N/A] Granulation Amount: [2:Large (67-100%)] [N/A:N/A] Granulation Quality: [2:Friable] [N/A:N/A] Necrotic Amount: [2:None Present (0%)] [N/A:N/A] Exposed Structures: [N/A:N/A] Fascia: No Fat Layer (Subcutaneous Tissue) Exposed: No Tendon: No Muscle: No Joint: No Bone: No Limited to Skin Breakdown Epithelialization: Large (67-100%) N/A N/A Periwound  Skin Texture: Excoriation: No N/A N/A Induration: No Callus: No Crepitus: No Rash: No Scarring: No Periwound Skin Maceration: No N/A N/A Moisture: Dry/Scaly: No Periwound Skin Color: Atrophie Blanche: Yes N/A N/A Hemosiderin Staining: Yes Mottled: Yes Cyanosis: No Ecchymosis: No Erythema: No Pallor: No Rubor: No Temperature: No Abnormality N/A N/A Tenderness on Yes N/A N/A Palpation: Wound Preparation: Ulcer Cleansing: N/A N/A Rinsed/Irrigated with Saline, Other: surg scrub and water Topical Anesthetic Applied: Other: lidocaine 4% Treatment Notes Wound #2 (Left, Medial Lower Leg) 1. Cleansed with: Cleanse wound with antibacterial soap and water 3. Peri-wound Care: Other peri-wound care (specify in notes) 4. Dressing Applied: Promogran 5. Secondary Dressing Applied ABD Pad 7. Secured with ANTONIQUE, ARMENDAREZ ([id number]) 3 Layer Compression System - Left Lower Extremity Notes drawtex TCA Electronic Signature(s) Signed: 12/04/2016 5:54:02 PM By: Linton Ham MD Entered By: Linton Ham on 12/04/2016 16:13:24 KEIRSTIN, LEACOCK ([id number]) -------------------------------------------------------------------------------- Pain Assessment Details Patient Name: VERNIDA, POWNELL. Date of Service: 12/04/2016 3:30 PM Medical Record Number: [id number] Patient Account Number: [id number] Date of Birth/Sex: [birth year] (71 y.o. Female) Treating RN: Afful, RN, [name], Velva Harman Primary Care Sheria Rosello: [name] Other Clinician: Referring Mycheal Veldhuizen: [name] Treating Jairon Ripberger/Extender: Tito Dine in Treatment: 3 Active Problems Location of Pain Severity and Description of Pain Patient Has Paino No Site Locations With Dressing Change: No Pain Management and Medication Current Pain Management: Electronic Signature(s) Signed: 12/04/2016 4:52:27 PM By: Regan Lemming [name], RN Entered By: Regan Lemming on 12/04/2016 15:31:52 [name]  ([id number]) -------------------------------------------------------------------------------- Patient/Caregiver Education Details Patient Name: HELAINE, BURFIELD. Date of Service: 12/04/2016 3:30 PM Medical Record Patient Account Number: [id number] Number: Treating RN: [name], RN, [name], Rita [birth year] [phone] y.o. Other Clinician: Date of Birth/Gender: Female) Treating ROBSON, [place] Primary Care Physician: [name] Physician/Extender: G Referring Physician: Donzetta Sprung in Treatment: 3 Education Assessment Education Provided To: Patient Education Topics Provided Venous: Methods: Explain/Verbal Responses: State content correctly Welcome To [facility]: Methods: Explain/Verbal Responses: State content correctly Wound/Skin Impairment: Methods: Explain/Verbal Responses: State content correctly Electronic Signature(s) Signed: 12/04/2016 4:52:27 PM By: Regan Lemming [name], RN Entered By: Regan Lemming on 12/04/2016 [place], [name] -------------------------------------------------------------------------------- Wound Assessment Details Patient Name: CARLASIA, NORENBERG. Date of Service: 12/04/2016 3:30 PM Medical Record Number: [id number] Patient Account Number: [id number] Date of Birth/Sex: 04-06-1946 (71 y.o. Female) Treating RN: Afful, RN, [name], Velva Harman Primary Care Juley Giovanetti: [name] Other Clinician: Referring Iosefa Weintraub: [name] Treating Gyanna Jarema/Extender: Tito Dine in Treatment: 3 Wound Status Wound Number: 2 Primary Atypical Etiology: Wound Location: Left Lower Leg - Medial Wound Open Wounding Event: Gradually Appeared Status: Date Acquired: 10/10/2016 Comorbid Cataracts, Asthma, Sleep Apnea, Deep Weeks Of  Treatment: 3 History: Vein Thrombosis, Hypertension, Clustered Wound: No Peripheral Venous Disease, Osteoarthritis, Received Chemotherapy, Received Radiation Photos Photo Uploaded By: Regan Lemming on 12/04/2016 16:51:51 Wound Measurements Length: (cm) 0.5 Width: (cm) 1 Depth: (cm) 0.1 Area: (cm) 0.393 Volume: (cm) 0.039 % Reduction in Area: 91.7% % Reduction in Volume: 91.7% Epithelialization: Large (67-100%) Tunneling: No Undermining: No Wound Description Classification: Partial Thickness Foul Odor Aft Wound Margin: Distinct, outline attached Slough/Fibrin Exudate Amount: Small Exudate Type: Serous Exudate Color: amber er Cleansing: No o No Wound Bed Granulation Amount: Large (67-100%) Exposed Structure Granulation Quality: Friable Fascia Exposed: No Archila, Ericia J. ([id number]) Necrotic Amount: None Present (0%) Fat Layer (Subcutaneous Tissue) Exposed: No Tendon Exposed: No Muscle Exposed: No Joint Exposed: No Bone Exposed: No Limited to Skin Breakdown Periwound Skin Texture Texture Color No Abnormalities Noted: No No Abnormalities Noted: No Callus: No Atrophie Blanche: Yes Crepitus: No Cyanosis: No Excoriation: No Ecchymosis: No Induration: No Erythema: No Rash: No Hemosiderin Staining: Yes Scarring: No Mottled: Yes Pallor: No Moisture Rubor: No No Abnormalities Noted: No Dry / Scaly: No Temperature / Pain Maceration: No Temperature: No Abnormality Tenderness on Palpation: Yes Wound Preparation Ulcer Cleansing: Rinsed/Irrigated with Saline, Other: surg scrub and water, Topical Anesthetic Applied: Other: lidocaine 4%, Treatment Notes Wound #2 (Left, Medial Lower Leg) 1. Cleansed with: Cleanse wound with antibacterial soap and water 3. Peri-wound Care: Other peri-wound care (specify in notes) 4. Dressing Applied: Promogran 5. Secondary Dressing Applied ABD Pad 7. Secured with 3 Layer Compression System - Left Lower Extremity Notes drawtex TCA Electronic Signature(s) Signed: 12/04/2016 4:52:27 PM By: Regan Lemming [name], RN Entered By: Regan Lemming on 12/04/2016 15:41:50 TASHANIQUE, NOVICKI  ([id number]) -------------------------------------------------------------------------------- Vitals Details Patient Name: MARLASIA, BATTAGLIA. Date of Service: 12/04/2016 3:30 PM Medical Record Number: [id number] Patient Account Number: [id number] Date of Birth/Sex: October 10, 1946 (71 y.o. Female) Treating RN: Afful, RN, [name], Velva Harman Primary Care Roston Grunewald: [name] Other Clinician: Referring Gizella [place]: [name] Treating Mayco Walrond/Extender: Tito Dine in Treatment: 3 Vital Signs Time Taken: 15:31 Temperature (F): 98.1 Height (in): 63 Pulse (bpm): 72 Weight (lbs): 218 Respiratory Rate (breaths/min): 16 Body Mass Index (BMI): 38.6 Blood Pressure (mmHg): 108/65 Reference Range: 80 - 120 mg / dl Electronic Signature(s) Signed: 12/04/2016 4:52:27 PM By: Regan Lemming [name], RN Entered By: Regan Lemming on 12/04/2016 15:34:46

## 2016-12-05 NOTE — Progress Notes (Signed)
Amber, [name] ([id number]) Visit Report for 11/29/2016 Physician Orders Details Patient Name: [name], [name]. Date of Service: 11/29/2016 10:15 AM Medical Record Patient Account Number: [id number] Number: Treating RN: Cornell Barman 07-21-1946 (70 y.o. Other Clinician: Date of Birth/Sex: Female) Treating ROBSON, MICHAEL Primary Care Provider: [name] Provider/Extender: G Referring Provider: Donzetta Sprung in Treatment: 2 Verbal / Phone Orders: No Diagnosis Coding Wound Cleansing Wound #2 Left,Medial Lower Leg o Cleanse wound with mild soap and water o May Shower, gently pat wound dry prior to applying new dressing. o May shower with protection. Skin Barriers/Peri-Wound Care Wound #2 Left,Medial Lower Leg o Triamcinolone Acetonide Ointment - If needed Primary Wound Dressing o Drawtex Wound #2 Left,Medial Lower Leg o Promogran Secondary Dressing Wound #2 Left,Medial Lower Leg o ABD pad Dressing Change Frequency Wound #2 Left,Medial Lower Leg o Other: - Twice weekly. Once in clinic, once by [place] #2 Left,Medial Lower Leg o Return Appointment in 1 week. o Nurse Visit as needed Edema Control Wound #2 Left,Medial Lower Leg REACE, FARQUHAR. ([id number]) o 3 Layer Compression System - Left Lower Extremity o Elevate legs to the level of the heart and pump ankles as often as possible Additional Orders / Instructions Wound #2 Left,Medial Lower Leg o Increase protein intake. o Activity as tolerated Home Health Wound #2 Left,Medial Lower Leg o [place] Visits - [facility] Nurse may visit PRN to address patientos wound care needs. o FACE TO FACE ENCOUNTER: MEDICARE and MEDICAID PATIENTS: I certify that this patient is under my care and that I had a face-to-face encounter that meets the physician face-to-face encounter requirements with this patient on this date. The  encounter with the patient was in whole or in part for the following MEDICAL CONDITION: (primary reason for [place]) MEDICAL NECESSITY: I certify, that based on my findings, NURSING services are a medically necessary home health service. HOME BOUND STATUS: I certify that my clinical findings support that this patient is homebound (i.e., Due to illness or injury, pt requires aid of supportive devices such as crutches, cane, wheelchairs, walkers, the use of special transportation or the assistance of another person to leave their place of residence. There is a normal inability to leave the home and doing so requires considerable and taxing effort. Other absences are for medical reasons / religious services and are infrequent or of short duration when for other reasons). o If current dressing causes regression in wound condition, may D/C ordered dressing product/s and apply Normal Saline Moist Dressing daily until next [place] / Other MD appointment. [place] of regression in wound condition at [phone]. o Please direct any NON-WOUND related issues/requests for orders to patient's Primary Care Physician Electronic Signature(s) Signed: 11/29/2016 3:30:03 PM By: [name] RN, [name], [name], [name] Signed: 12/04/2016 5:54:02 PM By: Linton Ham MD Entered By: [name], RN, [name], Kim on 11/29/2016 10:54:59 [name] -------------------------------------------------------------------------------- SuperBill Details Patient Name: Amber, [name]. Date of Service: 11/29/2016 Medical Record Number: [id number] Patient Account Number: [id number] Date of Birth/Sex: [birth year] (71 y.o. Female) Treating RN: Cornell Barman Primary Care Provider: [name] Other Clinician: Referring Provider: [name] Treating Provider/Extender: Frann Rider in Treatment: 2 Diagnosis Coding ICD-10 Codes Code Description [id number] Non-pressure  chronic ulcer of left calf limited to breakdown of skin Chronic venous hypertension (idiopathic) with ulcer and inflammation of left lower I87.332 extremity I89.0 Lymphedema, not elsewhere classified Facility Procedures CPT4: Description  Modifier Quantity Code [id number] (Facility Use Only) [phone] - APPLY MULTLAY COMPRS LWR LT 1 LEG Electronic Signature(s) Signed: 11/29/2016 3:30:03 PM By: Christin Fudge MD, FACS Signed: 11/29/2016 3:30:03 PM By: [name] RN, [name], [name], [name] Entered By: [name], RN, [name], Kim on 11/29/2016 [id number]

## 2016-12-05 NOTE — Progress Notes (Signed)
[name] Visit Report for 11/28/2016 Chief Complaint Document Details Patient Name: [name], [name]. Date of Service: 11/28/2016 1:45 PM Medical Record Patient Account Number: [id number] Number: Treating RN: [name], RN, [name], Rita 07-05-1946 [phone] y.o. Other Clinician: Date of Birth/Sex: Female) Treating Mamadou Breon Primary Care Provider: [name] Provider/Extender: G Referring Provider: Donzetta Sprung in Treatment: 2 Information Obtained from: Patient Chief Complaint Left calf venous stasis ulceration. 11/13/16; the patient is here for a left calf recurrent ulceration which is painful and has been present for the last month Electronic Signature(s) Signed: 11/28/2016 6:11:14 PM By: Linton Ham MD Entered By: Linton Ham on 11/28/2016 17:30:05 Crevier, [name] ([id number]) -------------------------------------------------------------------------------- HPI Details Patient Name: [name]. Date of Service: 11/28/2016 1:45 PM Medical Record Patient Account Number: [id number] Number: Treating RN: [name], RN, [name], Rita Apr 15, 1946 [phone] y.o. Other Clinician: Date of Birth/Sex: Female) Treating Theophil Thivierge Primary Care Provider: [name] Provider/Extender: G Referring Provider: Donzetta Sprung in Treatment: 2 History of Present Illness HPI Description: Pleasant 71 year old with history of chronic venous insufficiency. No diabetes or peripheral vascular disease. Left ABI 1.29. Questionable history of left lower extremity DVT. She developed a recurrent ulceration on her left lateral calf in December 2015, which she attributes to poor diet and subsequent lower extremity edema. She underwent endovenous laser ablation of her left greater saphenous vein in 2010. She underwent laser ablation of accessory branch of left GSV in April 2016 by Dr. Kellie Simmering at [facility]. She was previously wearing Unna boots, which  she tolerated well. Tolerating 2 layer compression and cadexomer iodine. She returns to clinic for follow-up and is without new complaints. She denies any significant pain at this time. She reports persistent pain with pressure. No claudication or ischemic rest pain. No fever or chills. No drainage. READMISSION 11/13/16; this is a 71 year old woman who is not a diabetic. She is here for a review of a painful area on her left medial lower extremity. I note that she was seen here previously last year for wound I believe to be in the same area. At that time she had undergone previously a left greater saphenous vein ablation by Dr. Kellie Simmering and she had a ablation of the anterior accessory branch of the left greater saphenous vein in March 2016. Seeing that the wound actually closed over. In reviewing the history with her today the ulcer in this area has been recurrent. She describes a biopsy of this area in 2009 that only showed stasis physiology. 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 Do not see that she has ever had arterial studies in the left leg. She tells me she has a follow-up with Dr. Kellie Simmering in roughly 10 days In any case she developed the reopening of this area roughly a month ago. On the background of this she describes rapidly increasing edema which has responded to Lasix 40 mg and metolazone 2.5 mg as well as the patient's lymph massage. She has been told she has both venous insufficiency and lymphedema but she cannot tolerate compression stockings 11/28/16; the patient saw Dr. Kellie Simmering recently. Per the patient he did arterial Dopplers in the office that did not show evidence of arterial insufficiency, per the patient he stated "treat this like  an ordinary venous ulcer".  She also saw her dermatologist Dr. Ronnald Ramp who felt that this was more of a vascular ulcer. In general things are improving although she arrives today with increasing bilateral lower extremity edema with weeping a deeper fluid through the wound on the left medial leg compatible with some degree of lymphedema Electronic Signature(s) LINZE, TONTI ([id number]) Signed: 11/28/2016 6:11:14 PM By: Linton Ham MD Entered By: Linton Ham on 11/28/2016 17:31:18 Knab, [name] ([id number]) -------------------------------------------------------------------------------- Physical Exam Details Patient Name: [name]. Date of Service: 11/28/2016 1:45 PM Medical Record Patient Account Number: [id number] Number: Treating RN: [name], RN, [name], Rita Aug 21, 1946 [phone] y.o. Other Clinician: Date of Birth/Sex: Female) Treating Dequandre Cordova Primary Care Provider: [name] Provider/Extender: G Referring Provider: Donzetta Sprung in Treatment: 2 Constitutional Sitting or standing Blood Pressure is within target range for patient.. Pulse regular and within target range for patient.Marland Kitchen Respirations regular, non-labored and within target range.. Temperature is normal and within the target range for the patient.. Patient's appearance is neat and clean. Appears in no acute distress. Well nourished and well developed.. Eyes Conjunctivae clear. No discharge.Marland Kitchen Respiratory Respiratory effort is easy and symmetric bilaterally. Rate is normal at rest and on room air.. Cardiovascular Pedal pulses palpable and strong bilaterally.. Edema present in both extremities. Left greater than right. Lymphatic Nonpalpable in the popliteal or inguinal area. Integumentary (Hair, Skin) No evidence of a primary skin problem in her lower legs. Notes Wound exam; the area over her left medial leg looks better than when I first saw this third 2 small open areas looking like a figure 8 lying on  that side she continues to have small petechial areas in this area which I've always found unusual. However the edema in her legs is not well controlled today. Electronic Signature(s) Signed: 11/28/2016 6:11:14 PM By: Linton Ham MD Entered By: Linton Ham on 11/28/2016 17:32:52 Orengo, [name] ([id number]) -------------------------------------------------------------------------------- Physician Orders Details Patient Name: AIMME, STARZYNSKI. Date of Service: 11/28/2016 1:45 PM Medical Record Patient Account Number: [id number] Number: Treating RN: [name], RN, [name], Rita 1946-01-01 [phone] y.o. Other Clinician: Date of Birth/Sex: Female) Treating Ali Mclaurin Primary Care Provider: [name] Provider/Extender: G Referring Provider: Donzetta Sprung in Treatment: 2 Verbal / Phone Orders: No Diagnosis Coding Wound Cleansing Wound #2 Left,Medial Lower Leg o Cleanse wound with mild soap and water o May Shower, gently pat wound dry prior to applying new dressing. o May shower with protection. Skin Barriers/Peri-Wound Care Wound #2 Left,Medial Lower Leg o Triamcinolone Acetonide Ointment Primary Wound Dressing Wound #2 Left,Medial Lower Leg o Promogran Secondary Dressing Wound #2 Left,Medial Lower Leg o ABD pad Dressing Change Frequency Wound #2 Left,Medial Lower Leg o Change dressing every week Follow-up Appointments Wound #2 Left,Medial Lower Leg o Return Appointment in 1 week. o Nurse Visit as needed Edema Control Wound #2 Left,Medial Lower Leg o 3 Layer Compression System - Left Lower Extremity o Elevate legs to the level of the heart and pump ankles as often as possible Additional Orders / Instructions BEAUTON, FLOURNOY. ([id number]) Wound #2 Left,Medial Lower Leg o Increase protein intake. o Activity as tolerated Home Health Wound #2 Left,Medial Lower Leg o [place] Visits - [place]  Nurse may visit PRN to address patientos wound care needs. o FACE TO FACE ENCOUNTER: MEDICARE and MEDICAID PATIENTS: I certify that this patient is under my care and that I had a face-to-face encounter that meets the physician  face-to-face encounter requirements with this patient on this date. The encounter with the patient was in whole or in part for the following MEDICAL CONDITION: (primary reason for [place]) MEDICAL NECESSITY: I certify, that based on my findings, NURSING services are a medically necessary home health service. HOME BOUND STATUS: I certify that my clinical findings support that this patient is homebound (i.e., Due to illness or injury, pt requires aid of supportive devices such as crutches, cane, wheelchairs, walkers, the use of special transportation or the assistance of another person to leave their place of residence. There is a normal inability to leave the home and doing so requires considerable and taxing effort. Other absences are for medical reasons / religious services and are infrequent or of short duration when for other reasons). o If current dressing causes regression in wound condition, may D/C ordered dressing product/s and apply Normal Saline Moist Dressing daily until next [place] / Other MD appointment. [place] of regression in wound condition at [phone]. o Please direct any NON-WOUND related issues/requests for orders to patient's Primary Care Physician Electronic Signature(s) Signed: 11/29/2016 3:30:03 PM By: [name] RN, [name], [name], [name] Signed: 12/04/2016 5:54:02 PM By: Linton Ham MD Previous Signature: 11/28/2016 6:11:14 PM Version By: Linton Ham MD Entered By: [name] RN, [name], Kim on 11/29/2016 10:07:38 HAPPY, BALIAN ([id number]) -------------------------------------------------------------------------------- Problem List Details Patient Name: CAROLL, PRIMMER. Date of Service: 11/28/2016  1:45 PM Medical Record Patient Account Number: [id number] Number: Treating RN: [name], RN, [name], Rita 1946-04-11 [phone] y.o. Other Clinician: Date of Birth/Sex: Female) Treating Anniston Nellums Primary Care Provider: [name] Provider/Extender: G Referring Provider: Donzetta Sprung in Treatment: 2 Active Problems ICD-10 Encounter Code Description Active Date Diagnosis L97.221 Non-pressure chronic ulcer of left calf limited to 11/13/2016 Yes breakdown of skin I87.332 Chronic venous hypertension (idiopathic) with ulcer and 11/13/2016 Yes inflammation of left lower extremity I89.0 Lymphedema, not elsewhere classified 11/13/2016 Yes Inactive Problems Resolved Problems Electronic Signature(s) Signed: 11/28/2016 6:11:14 PM By: Linton Ham MD Entered By: Linton Ham on 11/28/2016 17:29:43 Ryant, [name] -------------------------------------------------------------------------------- Progress Note Details Patient Name: LUCYLLE, BURKETTE. Date of Service: 11/28/2016 1:45 PM Medical Record Patient Account Number: [id number] Number: Treating RN: [name], RN, [name], Rita [birth year] [phone] y.o. Other Clinician: Date of Birth/Sex: Female) Treating Maddisen Vought Primary Care Provider: [name] Provider/Extender: G Referring Provider: Donzetta Sprung in Treatment: 2 Subjective Chief Complaint Information obtained from Patient Left calf venous stasis ulceration. 11/13/16; the patient is here for a left calf recurrent ulceration which is painful and has been present for the last month History of Present Illness (HPI) Pleasant 71 year old with history of chronic venous insufficiency. No diabetes or peripheral vascular disease. Left ABI 1.29. Questionable history of left lower extremity DVT. She developed a recurrent ulceration on her left lateral calf in December 2015, which she attributes to poor diet and subsequent lower extremity  edema. She underwent endovenous laser ablation of her left greater saphenous vein in 2010. She underwent laser ablation of accessory branch of left GSV in April 2016 by Dr. Kellie Simmering at [facility]. She was previously wearing Unna boots, which she tolerated well. Tolerating 2 layer compression and cadexomer iodine. She returns to clinic for follow-up and is without new complaints. She denies any significant pain at this time. She reports persistent pain with pressure. No claudication or ischemic rest pain. No fever or chills. No drainage. READMISSION 11/13/16; this is a 71 year old woman who is  not a diabetic. She is here for a review of a painful area on her left medial lower extremity. I note that she was seen here previously last year for wound I believe to be in the same area. At that time she had undergone previously a left greater saphenous vein ablation by Dr. Kellie Simmering and she had a ablation of the anterior accessory branch of the left greater saphenous vein in March 2016. Seeing that the wound actually closed over. In reviewing the history with her today the ulcer in this area has been recurrent. She describes a biopsy of this area in 2009 that only showed stasis physiology. 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 Do not see that she has ever had arterial studies in the left leg. She tells me she has a follow-up with Dr. Kellie Simmering in roughly 10 days In any case she developed the reopening of this area roughly a month ago. On the background of this she describes rapidly increasing edema which has responded to Lasix 40 mg and metolazone 2.5 mg as well as the patient's lymph massage. She has been told she has both venous insufficiency and lymphedema but she cannot tolerate  compression stockings 11/28/16; the patient saw Dr. Kellie Simmering recently. Per the patient he did arterial Dopplers in the office that did not show evidence of arterial insufficiency, per the patient he stated "treat this like an ordinary venous Wickliffe, Suezette J. ([id number]) ulcer". She also saw her dermatologist Dr. Ronnald Ramp who felt that this was more of a vascular ulcer. In general things are improving although she arrives today with increasing bilateral lower extremity edema with weeping a deeper fluid through the wound on the left medial leg compatible with some degree of lymphedema Objective Constitutional Sitting or standing Blood Pressure is within target range for patient.. Pulse regular and within target range for patient.Marland Kitchen Respirations regular, non-labored and within target range.. Temperature is normal and within the target range for the patient.. Patient's appearance is neat and clean. Appears in no acute distress. Well nourished and well developed.. Vitals Time Taken: 2:00 PM, Height: 63 in, Weight: 218 lbs, BMI: 38.6, Temperature: 98.2 F, Pulse: 73 bpm, Respiratory Rate: 16 breaths/min, Blood Pressure: 108/82 mmHg. Eyes Conjunctivae clear. No discharge.Marland Kitchen Respiratory Respiratory effort is easy and symmetric bilaterally. Rate is normal at rest and on room air.. Cardiovascular Pedal pulses palpable and strong bilaterally.. Edema present in both extremities. Left greater than right. Lymphatic Nonpalpable in the popliteal or inguinal area. General Notes: Wound exam; the area over her left medial leg looks better than when I first saw this third 2 small open areas looking like a figure 8 lying on that side she continues to have small petechial areas in this area which I've always found unusual. However the edema in her legs is not well controlled today. Integumentary (Hair, Skin) No evidence of a primary skin problem in her lower legs. Wound #2 status is Open. Original cause of wound was  Gradually Appeared. The wound is located on the Left,Medial Lower Leg. The wound measures 0.5cm length x 1.5cm width x 0.1cm depth; 0.589cm^2 area and 0.059cm^3 volume. The wound is limited to skin breakdown. There is no tunneling or undermining noted. There is a small amount of serous drainage noted. The  wound margin is distinct with the outline attached to the wound base. There is medium (34-66%) friable granulation within the wound bed. There is a small (1-33%) amount of necrotic tissue within the wound bed including Adherent Slough. The periwound skin appearance exhibited: Hemosiderin Staining, Mottled. The periwound skin appearance did not exhibit: Callus, Crepitus, Excoriation, Induration, Rash, Scarring, Dry/Scaly, Maceration, Atrophie BULA, JINKS. ([id number]) Cyanosis, Ecchymosis, Pallor, Rubor, Erythema. Periwound temperature was noted as No Abnormality. The periwound has tenderness on palpation. Assessment Active Problems ICD-10 L97.221 - Non-pressure chronic ulcer of left calf limited to breakdown of skin I87.332 - Chronic venous hypertension (idiopathic) with ulcer and inflammation of left lower extremity I89.0 - Lymphedema, not elsewhere classified Plan Wound Cleansing: Wound #2 Left,Medial Lower Leg: Cleanse wound with mild soap and water May Shower, gently pat wound dry prior to applying new dressing. May shower with protection. Skin Barriers/Peri-Wound Care: Wound #2 Left,Medial Lower Leg: Triamcinolone Acetonide Ointment Primary Wound Dressing: Wound #2 Left,Medial Lower Leg: Promogran Secondary Dressing: Wound #2 Left,Medial Lower Leg: ABD pad Dressing Change Frequency: Wound #2 Left,Medial Lower Leg: Change dressing every week Follow-up Appointments: Wound #2 Left,Medial Lower Leg: Return Appointment in 1 week. Nurse Visit as needed Edema Control: Wound #2 Left,Medial Lower Leg: 3 Layer Compression System - Left Lower Extremity Elevate legs to  the level of the heart and pump ankles as often as possible Additional Orders / Instructions: Wound #2 Left,Medial Lower Leg: Increase protein intake. JHADE, KIRKENDOLL ([id number]) Activity as tolerated Home Health: Wound #2 Left,Medial Lower Leg: Continue Home Health Visits - [place] Nurse may visit PRN to address patient s wound care needs. FACE TO FACE ENCOUNTER: MEDICARE and MEDICAID PATIENTS: I certify that this patient is under my care and that I had a face-to-face encounter that meets the physician face-to-face encounter requirements with this patient on this date. The encounter with the patient was in whole or in part for the following MEDICAL CONDITION: (primary reason for [place]) MEDICAL NECESSITY: I certify, that based on my findings, NURSING services are a medically necessary home health service. HOME BOUND STATUS: I certify that my clinical findings support that this patient is homebound (i.e., Due to illness or injury, pt requires aid of supportive devices such as crutches, cane, wheelchairs, walkers, the use of special transportation or the assistance of another person to leave their place of residence. There is a normal inability to leave the home and doing so requires considerable and taxing effort. Other absences are for medical reasons / religious services and are infrequent or of short duration when for other reasons). If current dressing causes regression in wound condition, may D/C ordered dressing product/s and apply Normal Saline Moist Dressing daily until next [place] / Other MD appointment. [place] of regression in wound condition at [phone]. Please direct any NON-WOUND related issues/requests for orders to patient's Primary Care Physician #1 we will continue her with Promogran and ABD pads TCA over the wound area. I managed to convince her of the need for a 3 layer compression #2 I will check Dr.  Evelena Leyden notes to verify his statement about her arterial supply, we had ordered arterial studies but it turns out that this is likely not to be necessary. #3 I am not sure I agree with the idea that it this is a usual venous ulcer. Today it has weeping lymphedema fluid coming out of it, the edema will definitely need to be controlled Electronic  Signature(s) Signed: 11/29/2016 3:30:03 PM By: [name] RN, [name], [name], [name] Signed: 12/04/2016 5:54:02 PM By: Linton Ham MD Previous Signature: 11/28/2016 6:11:14 PM Version By: Linton Ham MD Entered By: [name] RN, [name], Kim on 11/29/2016 10:07:48 DENAJAH, RADWANSKI ([id number]) -------------------------------------------------------------------------------- SuperBill Details Patient Name: ANALIZ, ESSARY. Date of Service: 11/28/2016 Medical Record Patient Account Number: [id number] Number: Treating RN: [name], RN, [name], Rita Mar 18, 1946 [phone] y.o. Other Clinician: Date of Birth/Sex: Female) Treating [name] Robinson Primary Care Provider: [name] Provider/Extender: G Referring Provider: Donzetta Sprung in Treatment: 2 Diagnosis Coding ICD-10 Codes Code Description [phone] Non-pressure chronic ulcer of left calf limited to breakdown of skin Chronic venous hypertension (idiopathic) with ulcer and inflammation of left lower I87.332 extremity I89.0 Lymphedema, not elsewhere classified Facility Procedures CPT4: Description Modifier Quantity Code [id number] (Facility Use Only) [id number] - APPLY [place] [facility] 1 LEG Physician Procedures CPT4: Description Modifier Quantity Code [id number] - WC PHYS LEVEL 3 - EST PT 1 ICD-10 Description Diagnosis L97.221 Non-pressure chronic ulcer of left calf limited to breakdown of skin I87.332 Chronic venous hypertension (idiopathic) with ulcer and  inflammation of left lower extremity Electronic Signature(s) Signed: 12/04/2016 8:39:17 AM By: Regan Lemming [name], RN Signed: 12/04/2016  5:54:02 PM By: Linton Ham MD Previous Signature: 11/28/2016 6:11:14 PM Version By: Linton Ham MD Entered By: Regan Lemming on 12/04/2016 08:39:17

## 2016-12-05 NOTE — Progress Notes (Signed)
CHARDONAY, LONGFELLOW ([id number]) Visit Report for 11/28/2016 Arrival Information Details Patient Name: [name], [name]. Date of Service: 11/28/2016 1:45 PM Medical Record Number: [id number] Patient Account Number: [id number] Date of Birth/Sex: [birth year] (71 y.o. Female) Treating RN: Afful, RN, [name], Velva Harman Primary Care Roslyn Else: [name] Other Clinician: Referring Rutledge Selsor: [name] Treating Rosaline Ezekiel/Extender: Tito Dine in Treatment: 2 Visit Information History Since Last Visit All ordered tests and consults were completed: No Patient Arrived: Ambulatory Added or deleted any medications: No Arrival Time: 13:59 Any new allergies or adverse reactions: No Accompanied By: self Had a fall or experienced change in No Transfer Assistance: None activities of daily living that may affect Patient Identification Verified: Yes risk of falls: Secondary Verification Process Yes Signs or symptoms of abuse/neglect since last No Completed: visito Patient Requires Transmission-Based No Hospitalized since last visit: No Precautions: Has Dressing in Place as Prescribed: Yes Patient Has Alerts: No Pain Present Now: No Electronic Signature(s) Signed: 12/04/2016 4:52:27 PM By: Regan Lemming [name], RN Entered By: Regan Lemming on 11/28/2016 14:00:02 [name] -------------------------------------------------------------------------------- Encounter Discharge Information Details Patient Name: [name], [name]. Date of Service: 11/28/2016 1:45 PM Medical Record Number: [id number] Patient Account Number: [id number] Date of Birth/Sex: 11-08-1945 (71 y.o. Female) Treating RN: Afful, RN, [name], Velva Harman Primary Care Zorianna Taliaferro: [name] Other Clinician: Referring Josefina Rynders: [name] Treating Faline Langer/Extender: Tito Dine in Treatment: 2 Encounter Discharge Information Items Schedule Follow-up Appointment: No Medication Reconciliation  completed No and provided to Patient/Care Apolinar Bero: Provided on Clinical Summary of Care: 11/28/2016 Form Type Recipient Paper Patient LP Electronic Signature(s) Signed: 11/28/2016 2:38:08 PM By: Ruthine Dose Entered By: Ruthine Dose on 11/28/2016 14:38:08 [name], [name] -------------------------------------------------------------------------------- Lower Extremity Assessment Details Patient Name: [name], [name]. Date of Service: 11/28/2016 1:45 PM Medical Record Number: [id number] Patient Account Number: [id number] Date of Birth/Sex: 06-Dec-1945 (71 y.o. Female) Treating RN: Afful, RN, [name], [place] Primary Care Lauralei Clouse: [name] Other Clinician: Referring Orvel Cutsforth: [name] Treating Gleb Mcguire/Extender: Tito Dine in Treatment: 2 Edema Assessment Assessed: [Left: No] [Right: No] Edema: [Left: Ye] [Right: s] Calf Left: Right: Point of Measurement: 30 cm From Medial Instep 43.5 cm cm Ankle Left: Right: Point of Measurement: 9 cm From Medial Instep 24.2 cm cm Vascular Assessment Claudication: Claudication Assessment [Left:None] Pulses: Dorsalis Pedis Palpable: [Left:Yes] Posterior Tibial Extremity colors, hair growth, and conditions: Extremity Color: [Left:Mottled] Hair Growth on Extremity: [Left:Yes] Temperature of Extremity: [Left:Warm] Capillary Refill: [Left:< 3 seconds] Toe Nail Assessment Left: Right: Thick: Yes Discolored: Yes Deformed: No Improper Length and Hygiene: No Electronic Signature(s) Signed: 12/04/2016 4:52:27 PM By: Regan Lemming [name], RN Entered By: Regan Lemming on 11/28/2016 14:04:10 [name], [name] [name], [name] -------------------------------------------------------------------------------- Multi Wound Chart Details Patient Name: [name], [name]. Date of Service: 11/28/2016 1:45 PM Medical Record Number: [id number] Patient Account Number: [id number] Date of Birth/Sex: 26-Feb-1946  (71 y.o. Female) Treating RN: [name], RN, [name], Velva Harman Primary Care Lavona Norsworthy: [name] Other Clinician: Referring Saige Canton: [name] Treating Kahleb Mcclane/Extender: Tito Dine in Treatment: 2 Vital Signs Height(in): 63 Pulse(bpm): 73 Weight(lbs): 218 Blood Pressure 108/82 (mmHg): Body Mass Index(BMI): 39 Temperature(F): 98.2 Respiratory Rate 16 (breaths/min): Photos: [N/A:N/A] Wound Location: Left Lower Leg - Medial N/A N/A Wounding Event: Gradually Appeared N/A N/A Primary Etiology: Atypical N/A N/A Comorbid History: Cataracts, Asthma, Sleep N/A N/A Apnea, Deep Vein Thrombosis, Hypertension, Peripheral Venous Disease, Osteoarthritis, Received Chemotherapy, Received Radiation Date Acquired: 10/10/2016 N/A N/A Weeks of Treatment: 2 N/A  N/A Wound Status: Open N/A N/A Measurements L x W x D 0.5x1.5x0.1 N/A N/A (cm) Area (cm) : 0.589 N/A N/A Volume (cm) : 0.059 N/A N/A % Reduction in Area: 87.50% N/A N/A % Reduction in Volume: 87.50% N/A N/A Classification: Partial Thickness N/A N/A Exudate Amount: Small N/A N/A Exudate Type: Serous N/A N/A [name], [name] ([id number]) Exudate Color: amber N/A N/A Wound Margin: Distinct, outline attached N/A N/A Granulation Amount: Medium (34-66%) N/A N/A Granulation Quality: Friable N/A N/A Necrotic Amount: Small (1-33%) N/A N/A Exposed Structures: Fascia: No N/A N/A Fat Layer (Subcutaneous Tissue) Exposed: No Tendon: No Muscle: No Joint: No Bone: No Limited to Skin Breakdown Epithelialization: None N/A N/A Periwound Skin Texture: Excoriation: No N/A N/A Induration: No Callus: No Crepitus: No Rash: No Scarring: No Periwound Skin Maceration: No N/A N/A Moisture: Dry/Scaly: No Periwound Skin Color: Hemosiderin Staining: Yes N/A N/A Mottled: Yes Atrophie Blanche: No Cyanosis: No Ecchymosis: No Erythema: No Pallor: No Rubor: No Temperature: No Abnormality N/A N/A Tenderness on Yes N/A  N/A Palpation: Wound Preparation: Ulcer Cleansing: N/A N/A Rinsed/Irrigated with Saline Topical Anesthetic Applied: Other: lidocaine 4% Treatment Notes Electronic Signature(s) Signed: 11/28/2016 6:11:14 PM By: Linton Ham MD Entered By: Linton Ham on 11/28/2016 17:29:53 Kosak, [name] -------------------------------------------------------------------------------- Pain Assessment Details Patient Name: [name], [name]. Date of Service: 11/28/2016 1:45 PM Medical Record Number: [id number] Patient Account Number: [id number] Date of Birth/Sex: 10-Nov-1945 (71 y.o. Female) Treating RN: Afful, RN, [name], Velva Harman Primary Care Perpetua Elling: [name] Other Clinician: Referring Kullen Tomasetti: [name] Treating Juliette Standre/Extender: Tito Dine in Treatment: 2 Active Problems Location of Pain Severity and Description of Pain Patient Has Paino No Site Locations With Dressing Change: No Pain Management and Medication Current Pain Management: Electronic Signature(s) Signed: 12/04/2016 4:52:27 PM By: Regan Lemming [name], RN Entered By: Regan Lemming on 11/28/2016 14:00:10 [name] -------------------------------------------------------------------------------- Wound Assessment Details Patient Name: [name], [name]. Date of Service: 11/28/2016 1:45 PM Medical Record Number: [id number] Patient Account Number: [id number] Date of Birth/Sex: October 13, 1946 (71 y.o. Female) Treating RN: Afful, RN, [name], [employer] Primary Care Sapphira Harjo: [name] Other Clinician: Referring Kymari Lollis: [name] Treating Novah Nessel/Extender: Tito Dine in Treatment: 2 Wound Status Wound Number: 2 Primary Atypical Etiology: Wound Location: Left Lower Leg - Medial Wound Open Wounding Event: Gradually Appeared Status: Date Acquired: 10/10/2016 Comorbid Cataracts, Asthma, Sleep Apnea, Deep Weeks Of Treatment: 2 History: Vein Thrombosis,  Hypertension, Clustered Wound: No Peripheral Venous Disease, Osteoarthritis, Received Chemotherapy, Received Radiation Photos Photo Uploaded By: [name], RN, [name], Kim on 11/28/2016 16:19:06 Wound Measurements Length: (cm) 0.5 Width: (cm) 1.5 Depth: (cm) 0.1 Area: (cm) 0.589 Volume: (cm) 0.059 % Reduction in Area: 87.5% % Reduction in Volume: 87.5% Epithelialization: None Tunneling: No Undermining: No Wound Description Classification: Partial Thickness Wound Margin: Distinct, outline attached Exudate Amount: Small Exudate Type: Serous Exudate Color: amber Foul Odor After Cleansing: No Wound Bed Granulation Amount: Medium (34-66%) Exposed Structure Granulation Quality: Friable Fascia Exposed: No [name], [name] ([id number]) Necrotic Amount: Small (1-33%) Fat Layer (Subcutaneous Tissue) Exposed: No Necrotic Quality: Adherent Slough Tendon Exposed: No Muscle Exposed: No Joint Exposed: No Bone Exposed: No Limited to Skin Breakdown Periwound Skin Texture Texture Color No Abnormalities Noted: No No Abnormalities Noted: No Callus: No Atrophie Blanche: No Crepitus: No Cyanosis: No Excoriation: No Ecchymosis: No Induration: No Erythema: No Rash: No Hemosiderin Staining: Yes Scarring: No Mottled: Yes Pallor: No Moisture Rubor: No No Abnormalities Noted: No Dry / Scaly: No Temperature / Pain  Maceration: No Temperature: No Abnormality Tenderness on Palpation: Yes Wound Preparation Ulcer Cleansing: Rinsed/Irrigated with Saline Topical Anesthetic Applied: Other: lidocaine 4%, Electronic Signature(s) Signed: 12/04/2016 4:52:27 PM By: Regan Lemming [name], RN Entered By: Regan Lemming on 11/28/2016 14:02:48 [name], [name] -------------------------------------------------------------------------------- Vitals Details Patient Name: [name], LASSWELL. Date of Service: 11/28/2016 1:45 PM Medical Record Number: [id number] Patient Account Number:  [id number] Date of Birth/Sex: Mar 19, 1946 (71 y.o. Female) Treating RN: Afful, RN, [name], Velva Harman Primary Care Ambrose Wile: [name] Other Clinician: Referring Raima Geathers: [name] Treating Shaquile Lutze/Extender: Tito Dine in Treatment: 2 Vital Signs Time Taken: 14:00 Temperature (F): 98.2 Height (in): 63 Pulse (bpm): 73 Weight (lbs): 218 Respiratory Rate (breaths/min): 16 Body Mass Index (BMI): 38.6 Blood Pressure (mmHg): 108/82 Reference Range: 80 - 120 mg / dl Electronic Signature(s) Signed: 12/04/2016 4:52:27 PM By: Regan Lemming [name], RN Entered By: Regan Lemming on 11/28/2016 14:00:50

## 2016-12-05 NOTE — Progress Notes (Signed)
LOUDELL, HIRANI ([id number]) Visit Report for 12/04/2016 Chief Complaint Document Details Patient Name: [name], [name]. Date of Service: 12/04/2016 3:30 PM Medical Record Patient Account Number: [id number] Number: Treating RN: [name], RN, [name], Rita 07/21/1946 [phone] y.o. Other Clinician: Date of Birth/Sex: Female) Treating ROBSON, MICHAEL Primary Care Provider: [name] Provider/Extender: G Referring Provider: Donzetta Sprung in Treatment: 3 Information Obtained from: Patient Chief Complaint Left calf venous stasis ulceration. 11/13/16; the patient is here for a left calf recurrent ulceration which is painful and has been present for the last month Electronic Signature(s) Signed: 12/04/2016 5:54:02 PM By: Linton Ham MD Entered By: Linton Ham on 12/04/2016 16:13:42 [place], [name] -------------------------------------------------------------------------------- HPI Details Patient Name: [name], [name]. Date of Service: 12/04/2016 3:30 PM Medical Record Patient Account Number: [id number] Number: Treating RN: [name], RN, [name], Rita 1946-04-20 [phone] y.o. Other Clinician: Date of Birth/Sex: Female) Treating ROBSON, MICHAEL Primary Care Provider: [name] Provider/Extender: G Referring Provider: Donzetta Sprung in Treatment: 3 History of Present Illness HPI Description: Pleasant 71 year old with history of chronic venous insufficiency. No diabetes or peripheral vascular disease. Left ABI 1.29. Questionable history of left lower extremity DVT. She developed a recurrent ulceration on her left lateral calf in December 2015, which she attributes to poor diet and subsequent lower extremity edema. She underwent endovenous laser ablation of her left greater saphenous vein in 2010. She underwent laser ablation of accessory branch of left GSV in April 2016 by Dr. Kellie Simmering at [facility]. She was previously wearing Unna boots, which  she tolerated well. Tolerating 2 layer compression and cadexomer iodine. She returns to clinic for follow-up and is without new complaints. She denies any significant pain at this time. She reports persistent pain with pressure. No claudication or ischemic rest pain. No fever or chills. No drainage. READMISSION 11/13/16; this is a 72 year old woman who is not a diabetic. She is here for a review of a painful area on her left medial lower extremity. I note that she was seen here previously last year for wound I believe to be in the same area. At that time she had undergone previously a left greater saphenous vein ablation by Dr. Kellie Simmering and she had a ablation of the anterior accessory branch of the left greater saphenous vein in March 2016. Seeing that the wound actually closed over. In reviewing the history with her today the ulcer in this area has been recurrent. She describes a biopsy of this area in 2009 that only showed stasis physiology. 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 Do not see that she has ever had arterial studies in the left leg. She tells me she has a follow-up with Dr. Kellie Simmering in roughly 10 days In any case she developed the reopening of this area roughly a month ago. On the background of this she describes rapidly increasing edema which has responded to Lasix 40 mg and metolazone 2.5 mg as well as the patient's lymph massage. She has been told she has both venous insufficiency and lymphedema but she cannot tolerate compression stockings 11/28/16; the patient saw Dr. Kellie Simmering recently. Per the patient he did arterial Dopplers in the office that did not show evidence of arterial insufficiency, per the patient he stated "treat this like  an ordinary venous ulcer".  She also saw her dermatologist Dr. Ronnald Ramp who felt that this was more of a vascular ulcer. In general things are improving although she arrives today with increasing bilateral lower extremity edema with weeping a deeper fluid through the wound on the left medial leg compatible with some degree of lymphedema 12/04/16; the patient's wound is fully epithelialized but I don't think fully healed. We will do another week of depression with Promogran and TCA however I suspect we'll be able to discharge her next week. This is a very unusual-looking wound which was initially a figure-of-eight type wound lying on its side surrounded by [name] petechial like hemorrhage. She has had venous ablation on this side. She apparently does not have an arterial issue per Dr. Kellie Simmering. She saw her dermatologist thought it was "vascular". Patient is definitely going to need ongoing compression and I talked about this with her today she will go to elastic therapy after she leaves here next week Electronic Signature(s) Signed: 12/04/2016 5:54:02 PM By: Linton Ham MD Entered By: Linton Ham on 12/04/2016 16:15:23 [place], [name] -------------------------------------------------------------------------------- Physical Exam Details Patient Name: [name], [name]. Date of Service: 12/04/2016 3:30 PM Medical Record Patient Account Number: [id number] Number: Treating RN: [name], RN, [name], Rita 1945-12-26 [phone] y.o. Other Clinician: Date of Birth/Sex: Female) Treating ROBSON, MICHAEL Primary Care Provider: [name] Provider/Extender: G Referring Provider: Donzetta Sprung in Treatment: 3 Constitutional Sitting or standing Blood Pressure is within target range for patient.. Pulse regular and within target range for patient.Marland Kitchen Respirations regular, non-labored and within target range.. Temperature is normal and within the target range for the patient.. Patient's  appearance is neat and clean. Appears in no acute distress. Well nourished and well developed.Marland Kitchen Respiratory Respiratory effort is easy and symmetric bilaterally. Rate is normal at rest and on room air.. Cardiovascular . Her pedal pulse on the left is easily palpable.. Notes Wound exam; the areas over her left medial leg. This is epithelialized. Again there appears to be small petechial hemorrhages around this. The area is tender. She has had recurrent wounds in this area. There is nothing currently about this that looks malignant. She definitely has chronic venous issues edema perhaps some degree of mild lymphedema Electronic Signature(s) Signed: 12/04/2016 5:54:02 PM By: Linton Ham MD Entered By: Linton Ham on 12/04/2016 16:17:20 [place], [name] -------------------------------------------------------------------------------- Physician Orders Details Patient Name: [name], [name]. Date of Service: 12/04/2016 3:30 PM Medical Record Patient Account Number: [id number] Number: Treating RN: [name], RN, [name], Rita September 21, 1946 [phone] y.o. Other Clinician: Date of Birth/Sex: Female) Treating ROBSON, MICHAEL Primary Care Provider: [name] Provider/Extender: G Referring Provider: Donzetta Sprung in Treatment: 3 Verbal / Phone Orders: No Diagnosis Coding Wound Cleansing Wound #2 Left,Medial Lower Leg o Cleanse wound with mild soap and water o May Shower, gently pat wound dry prior to applying new dressing. o May shower with protection. Skin Barriers/Peri-Wound Care Wound #2 Left,Medial Lower Leg o Triamcinolone Acetonide Ointment Primary Wound Dressing Wound #2 Left,Medial Lower Leg o Promogran Secondary Dressing Wound #2 Left,Medial Lower Leg o ABD pad Dressing Change Frequency Wound #2 Left,Medial Lower Leg o Change dressing every week Follow-up Appointments Wound #2 Left,Medial Lower Leg o Return Appointment in 1  week. o Nurse Visit as needed Edema Control Wound #2 Left,Medial Lower Leg o 3 Layer Compression System - Left Lower Extremity o Elevate legs to the level of the heart and pump ankles as often as  possible Additional Orders / Instructions TANISHI, LAMBO ([id number]) Wound #2 Left,Medial Lower Leg o Increase protein intake. o Activity as tolerated Home Health Wound #2 Left,Medial Lower Leg o [place] Visits - [place] Nurse may visit PRN to address patientos wound care needs. o FACE TO FACE ENCOUNTER: MEDICARE and MEDICAID PATIENTS: I certify that this patient is under my care and that I had a face-to-face encounter that meets the physician face-to-face encounter requirements with this patient on this date. The encounter with the patient was in whole or in part for the following MEDICAL CONDITION: (primary reason for [place]) MEDICAL NECESSITY: I certify, that based on my findings, NURSING services are a medically necessary home health service. HOME BOUND STATUS: I certify that my clinical findings support that this patient is homebound (i.e., Due to illness or injury, pt requires aid of supportive devices such as crutches, cane, wheelchairs, walkers, the use of special transportation or the assistance of another person to leave their place of residence. There is a normal inability to leave the home and doing so requires considerable and taxing effort. Other absences are for medical reasons / religious services and are infrequent or of short duration when for other reasons). o If current dressing causes regression in wound condition, may D/C ordered dressing product/s and apply Normal Saline Moist Dressing daily until next [place] / Other MD appointment. [place] of regression in wound condition at [phone]. o Please direct any NON-WOUND related issues/requests for orders to patient's Primary  Care Physician Electronic Signature(s) Signed: 12/04/2016 4:52:27 PM By: Regan Lemming [name], RN Signed: 12/04/2016 5:54:02 PM By: Linton Ham MD Entered By: Regan Lemming on 12/04/2016 16:07:10 [name], [name] -------------------------------------------------------------------------------- Problem List Details Patient Name: [name], [name]. Date of Service: 12/04/2016 3:30 PM Medical Record Patient Account Number: [id number] Number: Treating RN: [name], RN, [name], Rita [birth year] [phone] y.o. Other Clinician: Date of Birth/Sex: Female) Treating ROBSON, MICHAEL Primary Care Provider: [name] Provider/Extender: G Referring Provider: Donzetta Sprung in Treatment: 3 Active Problems ICD-10 Encounter Code Description Active Date Diagnosis L97.221 Non-pressure chronic ulcer of left calf limited to 11/13/2016 Yes breakdown of skin I87.332 Chronic venous hypertension (idiopathic) with ulcer and 11/13/2016 Yes inflammation of left lower extremity I89.0 Lymphedema, not elsewhere classified 11/13/2016 Yes Inactive Problems Resolved Problems Electronic Signature(s) Signed: 12/04/2016 5:54:02 PM By: Linton Ham MD Entered By: Linton Ham on 12/04/2016 16:13:14 [name], [name] -------------------------------------------------------------------------------- Progress Note Details Patient Name: [name], [name]. Date of Service: 12/04/2016 3:30 PM Medical Record Patient Account Number: [id number] Number: Treating RN: [name], RN, [name], Rita [birth year] [phone] y.o. Other Clinician: Date of Birth/Sex: Female) Treating ROBSON, MICHAEL Primary Care Provider: [name] Provider/Extender: G Referring Provider: Donzetta Sprung in Treatment: 3 Subjective Chief Complaint Information obtained from Patient Left calf venous stasis ulceration. 11/13/16; the patient is here for a left calf recurrent ulceration which is painful and has been  present for the last month History of Present Illness (HPI) Pleasant 71 year old with history of chronic venous insufficiency. No diabetes or peripheral vascular disease. Left ABI 1.29. Questionable history of left lower extremity DVT. She developed a recurrent ulceration on her left lateral calf in December 2015, which she attributes to poor diet and subsequent lower extremity edema. She underwent endovenous laser ablation of her left greater saphenous vein in 2010. She underwent laser ablation of accessory branch of left GSV in April 2016 by Dr. Kellie Simmering at  [place]. She was previously wearing Unna boots, which she tolerated well. Tolerating 2 layer compression and cadexomer iodine. She returns to clinic for follow-up and is without new complaints. She denies any significant pain at this time. She reports persistent pain with pressure. No claudication or ischemic rest pain. No fever or chills. No drainage. READMISSION 11/13/16; this is a 71 year old woman who is not a diabetic. She is here for a review of a painful area on her left medial lower extremity. I note that she was seen here previously last year for wound I believe to be in the same area. At that time she had undergone previously a left greater saphenous vein ablation by Dr. Kellie Simmering and she had a ablation of the anterior accessory branch of the left greater saphenous vein in March 2016. Seeing that the wound actually closed over. In reviewing the history with her today the ulcer in this area has been recurrent. She describes a biopsy of this area in 2009 that only showed stasis physiology. 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 Do not see that she has ever had arterial studies in the left leg. She tells me she has  a follow-up with Dr. Kellie Simmering in roughly 10 days In any case she developed the reopening of this area roughly a month ago. On the background of this she describes rapidly increasing edema which has responded to Lasix 40 mg and metolazone 2.5 mg as well as the patient's lymph massage. She has been told she has both venous insufficiency and lymphedema but she cannot tolerate compression stockings 11/28/16; the patient saw Dr. Kellie Simmering recently. Per the patient he did arterial Dopplers in the office that did not show evidence of arterial insufficiency, per the patient he stated "treat this like an ordinary venous [name], [name] ([id number]) ulcer". She also saw her dermatologist Dr. Ronnald Ramp who felt that this was more of a vascular ulcer. In general things are improving although she arrives today with increasing bilateral lower extremity edema with weeping a deeper fluid through the wound on the left medial leg compatible with some degree of lymphedema 12/04/16; the patient's wound is fully epithelialized but I don't think fully healed. We will do another week of depression with Promogran and TCA however I suspect we'll be able to discharge her next week. This is a very unusual-looking wound which was initially a figure-of-eight type wound lying on its side surrounded by petechial like hemorrhage. She has had venous ablation on this side. She apparently does not have an arterial issue per Dr. Kellie Simmering. She saw her dermatologist thought it was "vascular". Patient is definitely going to need ongoing compression and I talked about this with her today she will go to elastic therapy after she leaves here next week Objective Constitutional Sitting or standing Blood Pressure is within target range for patient.. Pulse regular and within target range for patient.Marland Kitchen Respirations regular, non-labored and within target range.. Temperature is normal and within the target range for the patient.. Patient's appearance is  neat and clean. Appears in no acute distress. Well nourished and well developed.. Vitals Time Taken: 3:31 PM, Height: 63 in, Weight: 218 lbs, BMI: 38.6, Temperature: 98.1 F, Pulse: 72 bpm, Respiratory Rate: 16 breaths/min, Blood Pressure: 108/65 mmHg. Respiratory Respiratory effort  is easy and symmetric bilaterally. Rate is normal at rest and on room air.. Cardiovascular Her pedal pulse on the left is easily palpable.. General Notes: Wound exam; the areas over her left medial leg. This is epithelialized. Again there appears to be small petechial hemorrhages around this. The area is tender. She has had recurrent wounds in this area. There is nothing currently about this that looks malignant. She definitely has chronic venous issues edema perhaps some degree of mild lymphedema Integumentary (Hair, Skin) Wound #2 status is Open. Original cause of wound was Gradually Appeared. The wound is located on the Left,Medial Lower Leg. The wound measures 0.5cm length x 1cm width x 0.1cm depth; 0.393cm^2 area and 0.039cm^3 volume. The wound is limited to skin breakdown. There is no tunneling or undermining noted. There is a small amount of serous drainage noted. The wound margin is distinct with the outline attached to the wound base. There is large (67-100%) friable granulation within the wound bed. There is no necrotic tissue within the wound bed. The periwound skin appearance exhibited: Atrophie Blanche, Hemosiderin Staining, Mottled. The periwound skin appearance did not exhibit: Callus, Crepitus, Excoriation, Induration, Rash, Scarring, Dry/Scaly, Maceration, Cyanosis, Ecchymosis, Pallor, Rubor, [place], [name] ([id number]) Erythema. Periwound temperature was noted as No Abnormality. The periwound has tenderness on palpation. Assessment Active Problems ICD-10 L97.221 - Non-pressure chronic ulcer of left calf limited to breakdown of skin I87.332 - Chronic venous hypertension (idiopathic) with  ulcer and inflammation of left lower extremity I89.0 - Lymphedema, not elsewhere classified Plan Wound Cleansing: Wound #2 Left,Medial Lower Leg: Cleanse wound with mild soap and water May Shower, gently pat wound dry prior to applying new dressing. May shower with protection. Skin Barriers/Peri-Wound Care: Wound #2 Left,Medial Lower Leg: Triamcinolone Acetonide Ointment Primary Wound Dressing: Wound #2 Left,Medial Lower Leg: Promogran Secondary Dressing: Wound #2 Left,Medial Lower Leg: ABD pad Dressing Change Frequency: Wound #2 Left,Medial Lower Leg: Change dressing every week Follow-up Appointments: Wound #2 Left,Medial Lower Leg: Return Appointment in 1 week. Nurse Visit as needed Edema Control: Wound #2 Left,Medial Lower Leg: 3 Layer Compression System - Left Lower Extremity Elevate legs to the level of the heart and pump ankles as often as possible Additional Orders / Instructions: Wound #2 Left,Medial Lower Leg: Increase protein intake. [name], [name] Activity as tolerated Home Health: Wound #2 Left,Medial Lower Leg: Continue Home Health Visits - [place] Nurse may visit PRN to address patient s wound care needs. FACE TO FACE ENCOUNTER: MEDICARE and MEDICAID PATIENTS: I certify that this patient is under my care and that I had a face-to-face encounter that meets the physician face-to-face encounter requirements with this patient on this date. The encounter with the patient was in whole or in part for the following MEDICAL CONDITION: (primary reason for [place]) MEDICAL NECESSITY: I certify, that based on my findings, NURSING services are a medically necessary home health service. HOME BOUND STATUS: I certify that my clinical findings support that this patient is homebound (i.e., Due to illness or injury, pt requires aid of supportive devices such as crutches, cane, wheelchairs, walkers, the use of special transportation or the  assistance of another person to leave their place of residence. There is a normal inability to leave the home and doing so requires considerable and taxing effort. Other absences are for medical reasons / religious services and are infrequent or of short duration when for other reasons). If current dressing causes regression in wound condition, may D/C ordered  dressing product/s and apply Normal Saline Moist Dressing daily until next [place] / Other MD appointment. [place] of regression in wound condition at [phone]. Please direct any NON-WOUND related issues/requests for orders to patient's Primary Care Physician TCA,promogran and profore lite should be able to be discharged next week Electronic Signature(s) Signed: 12/04/2016 5:54:02 PM By: Linton Ham MD Entered By: Linton Ham on 12/04/2016 [place], [name] -------------------------------------------------------------------------------- SuperBill Details Patient Name: [name], [name]. Date of Service: 12/04/2016 Medical Record Patient Account Number: [id number] Number: Treating RN: [name], RN, [name], Rita [birth year] [phone] y.o. Other Clinician: Date of Birth/Sex: Female) Treating ROBSON, MICHAEL Primary Care Provider: [name] Provider/Extender: G Referring Provider: Donzetta Sprung in Treatment: 3 Diagnosis Coding ICD-10 Codes Code Description [phone] Non-pressure chronic ulcer of left calf limited to breakdown of skin Chronic venous hypertension (idiopathic) with ulcer and inflammation of left lower I87.332 extremity I89.0 Lymphedema, not elsewhere classified Facility Procedures CPT4: Description Modifier Quantity Code [id number] (Facility Use Only) [phone] - APPLY [place] [facility] 1 LEG Physician Procedures CPT4 Code Description: [id number] - WC PHYS LEVEL 2 - EST PT ICD-10 Description Diagnosis L97.221 Non-pressure chronic ulcer of  left calf limited to Modifier: breakdown of ski Quantity: 1 n Electronic Signature(s) Signed: 12/04/2016 5:54:02 PM By: Linton Ham MD Entered By: Linton Ham on 12/04/2016 16:18:49

## 2016-12-06 ENCOUNTER — Encounter: Payer: Medicare Other | Admitting: Occupational Therapy

## 2016-12-07 DIAGNOSIS — M40203 Unspecified kyphosis, cervicothoracic region: Secondary | ICD-10-CM | POA: Diagnosis not present

## 2016-12-07 DIAGNOSIS — I890 Lymphedema, not elsewhere classified: Secondary | ICD-10-CM | POA: Diagnosis not present

## 2016-12-07 DIAGNOSIS — M4713 Other spondylosis with myelopathy, cervicothoracic region: Secondary | ICD-10-CM | POA: Diagnosis not present

## 2016-12-07 DIAGNOSIS — I872 Venous insufficiency (chronic) (peripheral): Secondary | ICD-10-CM | POA: Diagnosis not present

## 2016-12-07 DIAGNOSIS — I10 Essential (primary) hypertension: Secondary | ICD-10-CM | POA: Diagnosis not present

## 2016-12-07 DIAGNOSIS — M419 Scoliosis, unspecified: Secondary | ICD-10-CM | POA: Diagnosis not present

## 2016-12-10 ENCOUNTER — Encounter: Payer: Medicare Other | Admitting: Occupational Therapy

## 2016-12-11 ENCOUNTER — Encounter: Payer: Medicare Other | Attending: Internal Medicine | Admitting: Internal Medicine

## 2016-12-11 ENCOUNTER — Encounter (HOSPITAL_COMMUNITY): Payer: Medicare Other

## 2016-12-11 DIAGNOSIS — Z86718 Personal history of other venous thrombosis and embolism: Secondary | ICD-10-CM | POA: Insufficient documentation

## 2016-12-11 DIAGNOSIS — M1990 Unspecified osteoarthritis, unspecified site: Secondary | ICD-10-CM | POA: Diagnosis not present

## 2016-12-11 DIAGNOSIS — I87332 Chronic venous hypertension (idiopathic) with ulcer and inflammation of left lower extremity: Secondary | ICD-10-CM | POA: Insufficient documentation

## 2016-12-11 DIAGNOSIS — L97221 Non-pressure chronic ulcer of left calf limited to breakdown of skin: Secondary | ICD-10-CM | POA: Diagnosis not present

## 2016-12-11 DIAGNOSIS — Z85820 Personal history of malignant melanoma of skin: Secondary | ICD-10-CM | POA: Diagnosis not present

## 2016-12-11 DIAGNOSIS — I87312 Chronic venous hypertension (idiopathic) with ulcer of left lower extremity: Secondary | ICD-10-CM | POA: Diagnosis not present

## 2016-12-11 DIAGNOSIS — I10 Essential (primary) hypertension: Secondary | ICD-10-CM | POA: Insufficient documentation

## 2016-12-11 DIAGNOSIS — Z882 Allergy status to sulfonamides status: Secondary | ICD-10-CM | POA: Insufficient documentation

## 2016-12-11 DIAGNOSIS — Z87891 Personal history of nicotine dependence: Secondary | ICD-10-CM | POA: Insufficient documentation

## 2016-12-11 DIAGNOSIS — I890 Lymphedema, not elsewhere classified: Secondary | ICD-10-CM | POA: Diagnosis not present

## 2016-12-12 ENCOUNTER — Encounter: Payer: Medicare Other | Admitting: Occupational Therapy

## 2016-12-12 ENCOUNTER — Encounter: Payer: Self-pay | Admitting: Pulmonary Disease

## 2016-12-12 NOTE — Progress Notes (Addendum)
[name], [name] ([id number]) Visit Report for 12/11/2016 Arrival Information Details Patient Name: [name], [name]. Date of Service: 12/11/2016 2:45 PM Medical Record Number: [id number] Patient Account Number: [id number] Date of Birth/Sex: [birth year] (71 y.o. Female) Treating RN: Afful, RN, [name], Velva Harman Primary Care Elektra Wartman: [name] Other Clinician: Referring Zymarion Favorite: [name] Treating Willard Madrigal/Extender: Tito Dine in Treatment: 4 Visit Information History Since Last Visit All ordered tests and consults were completed: No Patient Arrived: Ambulatory Added or deleted any medications: No Arrival Time: 14:40 Any new allergies or adverse reactions: No Accompanied By: self Had a fall or experienced change in No Transfer Assistance: None activities of daily living that may affect Patient Identification Verified: Yes risk of falls: Secondary Verification Process Yes Signs or symptoms of abuse/neglect since last No Completed: visito Patient Requires Transmission-Based No Hospitalized since last visit: No Precautions: Has Dressing in Place as Prescribed: Yes Patient Has Alerts: No Has Compression in Place as Prescribed: Yes Pain Present Now: No Electronic Signature(s) Signed: 12/11/2016 4:49:53 PM By: Regan Lemming [name], RN Entered By: Regan Lemming on 12/11/2016 14:40:44 [name] -------------------------------------------------------------------------------- Encounter Discharge Information Details Patient Name: [name], [name]. Date of Service: 12/11/2016 2:45 PM Medical Record Number: [id number] Patient Account Number: [id number] Date of Birth/Sex: 10/11/1946 (71 y.o. Female) Treating RN: Afful, RN, [name], Velva Harman Primary Care Devario Bucklew: [name] Other Clinician: Referring Adlean Hardeman: [name] Treating Tewana Bohlen/Extender: Tito Dine in Treatment: 4 Encounter Discharge Information Items Discharge Pain Level:  0 Discharge Condition: Stable Ambulatory Status: Ambulatory Discharge Destination: Home Transportation: Private Auto Accompanied By: self Schedule Follow-up Appointment: No Medication Reconciliation completed and provided to Patient/Care No Gerardine Peltz: Provided on Clinical Summary of Care: 12/11/2016 Form Type Recipient Paper Patient LP Electronic Signature(s) Signed: 12/11/2016 4:35:37 PM By: Regan Lemming [name], RN Previous Signature: 12/11/2016 3:23:16 PM Version By: Ruthine Dose Entered By: Regan Lemming on 12/11/2016 16:35:37 [name], [name] -------------------------------------------------------------------------------- Lower Extremity Assessment Details Patient Name: [name], [name]. Date of Service: 12/11/2016 2:45 PM Medical Record Number: [id number] Patient Account Number: [id number] Date of Birth/Sex: 30-May-1946 (71 y.o. Female) Treating RN: Afful, RN, [name], Velva Harman Primary Care Koree Staheli: [name] Other Clinician: Referring Delylah Stanczyk: [name] Treating Sion Reinders/Extender: Tito Dine in Treatment: 4 Edema Assessment Assessed: [Left: No] [Right: No] Edema: [Left: Ye] [Right: s] Calf Left: Right: Point of Measurement: 30 cm From Medial Instep 44.4 cm cm Ankle Left: Right: Point of Measurement: 9 cm From Medial Instep 24.4 cm cm Vascular Assessment Claudication: Claudication Assessment [Left:None] Pulses: Dorsalis Pedis Palpable: [Left:Yes] Posterior Tibial Extremity colors, hair growth, and conditions: Extremity Color: [Left:Mottled] Hair Growth on Extremity: [Left:Yes] Temperature of Extremity: [Left:Warm] Capillary Refill: [Left:< 3 seconds] Electronic Signature(s) Signed: 12/11/2016 4:49:53 PM By: Regan Lemming [name], RN Entered By: Regan Lemming on 12/11/2016 14:42:30 [name], [name] -------------------------------------------------------------------------------- Multi Wound Chart Details Patient Name: [name], [name]. Date of Service: 12/11/2016 2:45 PM Medical Record Number: [id number] Patient Account Number: [id number] Date of Birth/Sex: [birth year] (71 y.o. Female) Treating RN: [name], RN, [name], Velva Harman Primary Care Kasmira Cacioppo: [name] Other Clinician: Referring Torence Palmeri: [name] Treating Xhaiden Coombs/Extender: Tito Dine in Treatment: 4 Vital Signs Height(in): 63 Pulse(bpm): 72 Weight(lbs): 218 Blood Pressure 129/73 (mmHg): Body Mass Index(BMI): 39 Temperature(F): 98.1 Respiratory Rate 16 (breaths/min): Photos: [N/A:N/A] Wound Location: Left Lower Leg - Medial N/A N/A Wounding Event: Gradually Appeared N/A N/A Primary Etiology: Atypical N/A N/A Comorbid History: Cataracts, Asthma, Sleep N/A N/A Apnea, Deep Vein Thrombosis, Hypertension, Peripheral  Venous Disease, Osteoarthritis, Received Chemotherapy, Received Radiation Date Acquired: 10/10/2016 N/A N/A Weeks of Treatment: 4 N/A N/A Wound Status: Open N/A N/A Measurements L x W x D 0.5x0.5x0.1 N/A N/A (cm) Area (cm) : 0.196 N/A N/A Volume (cm) : 0.02 N/A N/A % Reduction in Area: 95.80% N/A N/A % Reduction in Volume: 95.80% N/A N/A Classification: Partial Thickness N/A N/A Exudate Amount: Small N/A N/A Exudate Type: Serous N/A N/A [name], [name] Exudate Color: amber N/A N/A Wound Margin: Distinct, outline attached N/A N/A Granulation Amount: Large (67-100%) N/A N/A Granulation Quality: Friable N/A N/A Necrotic Amount: None Present (0%) N/A N/A Exposed Structures: Fascia: No N/A N/A Fat Layer (Subcutaneous Tissue) Exposed: No Tendon: No Muscle: No Joint: No Bone: No Limited to Skin Breakdown Epithelialization: Large (67-100%) N/A N/A Periwound Skin Texture: Excoriation: No N/A N/A Induration: No Callus: No Crepitus: No Rash: No Scarring: No Periwound Skin Maceration: No N/A N/A Moisture: Dry/Scaly: No Periwound Skin Color: Atrophie Blanche: Yes N/A N/A Hemosiderin  Staining: Yes Mottled: Yes Cyanosis: No Ecchymosis: No Erythema: No Pallor: No Rubor: No Temperature: No Abnormality N/A N/A Tenderness on Yes N/A N/A Palpation: Wound Preparation: Ulcer Cleansing: N/A N/A Rinsed/Irrigated with Saline, Other: surg scrub and water Topical Anesthetic Applied: Other: lidocaine 4% Treatment Notes Wound #2 (Left, Medial Lower Leg) 1. Cleansed with: Cleanse wound with antibacterial soap and water 4. Dressing Applied: AMAND, BAYSINGER ([id number]) 5. Secondary Dressing Applied ABD Pad 7. Secured with 3 Layer Compression System - Right Lower Extremity Notes TCA Electronic Signature(s) Signed: 12/12/2016 4:28:51 PM By: Linton Ham MD Previous Signature: 12/11/2016 2:54:07 PM Version By: Regan Lemming [name], RN Entered By: Linton Ham on 12/11/2016 17:12:12 [name], [name] -------------------------------------------------------------------------------- Multi-Disciplinary Care Plan Details Patient Name: [name], [name]. Date of Service: 12/11/2016 2:45 PM Medical Record Number: [id number] Patient Account Number: [id number] Date of Birth/Sex: January 15, 1946 (71 y.o. Female) Treating RN: Afful, RN, [name], Velva Harman Primary Care Keyona Emrich: [name] Other Clinician: Referring Giana Castner: [name] Treating Hector Taft/Extender: Tito Dine in Treatment: 4 Active Inactive ` Orientation to the Wound Care Program Nursing Diagnoses: Knowledge deficit related to the wound healing center program Goals: Patient/caregiver will verbalize understanding of the [place] Program Date Initiated: 11/13/2016 Target Resolution Date: 03/18/2017 Goal Status: Active Interventions: Provide education on orientation to the wound center Notes: ` Venous Leg Ulcer Nursing Diagnoses: Knowledge deficit related to disease process and management Potential for venous Insuffiency (use before diagnosis  confirmed) Goals: Patient will maintain optimal edema control Date Initiated: 11/13/2016 Target Resolution Date: 03/18/2017 Goal Status: Active Patient/caregiver will verbalize understanding of disease process and disease management Date Initiated: 11/13/2016 Target Resolution Date: 03/18/2017 Goal Status: Active Verify adequate tissue perfusion prior to therapeutic compression application Date Initiated: 11/13/2016 Target Resolution Date: 03/18/2017 Goal Status: Active Interventions: Assess peripheral edema status every visit. Compression as ordered [name], [name] Provide education on venous insufficiency Notes: ` Wound/Skin Impairment Nursing Diagnoses: Impaired tissue integrity Knowledge deficit related to ulceration/compromised skin integrity Goals: Patient/caregiver will verbalize understanding of skin care regimen Date Initiated: 11/13/2016 Target Resolution Date: 03/18/2017 Goal Status: Active Ulcer/skin breakdown will have a volume reduction of 30% by week 4 Date Initiated: 11/13/2016 Target Resolution Date: 03/18/2017 Goal Status: Active Ulcer/skin breakdown will have a volume reduction of 50% by week 8 Date Initiated: 11/13/2016 Target Resolution Date: 03/18/2017 Goal Status: Active Ulcer/skin breakdown will have a volume reduction of 80% by week 12 Date Initiated: 11/13/2016 Target Resolution Date: 03/18/2017 Goal  Status: Active Ulcer/skin breakdown will heal within 14 weeks Date Initiated: 11/13/2016 Target Resolution Date: 03/18/2017 Goal Status: Active Interventions: Assess patient/caregiver ability to obtain necessary supplies Assess patient/caregiver ability to perform ulcer/skin care regimen upon admission and as needed Assess ulceration(s) every visit Provide education on ulcer and skin care Treatment Activities: Referred to DME Izic Stfort for dressing supplies : 11/13/2016 Skin care regimen initiated : 11/13/2016 Topical wound management initiated :  11/13/2016 Notes: Electronic Signature(s) Signed: 12/11/2016 2:54:00 PM By: Regan Lemming [name], RN Entered By: Regan Lemming on 12/11/2016 14:54:00 MUSLIMA, STROPES ([id number]) Ferdinand Lango, [name] -------------------------------------------------------------------------------- Pain Assessment Details Patient Name: [name], [name]. Date of Service: 12/11/2016 2:45 PM Medical Record Number: [id number] Patient Account Number: [id number] Date of Birth/Sex: [birth year] (71 y.o. Female) Treating RN: Afful, RN, [name], Velva Harman Primary Care Tarrin Lebow: [name] Other Clinician: Referring Kenyada Dosch: [name] Treating Tanaysha Alkins/Extender: Tito Dine in Treatment: 4 Active Problems Location of Pain Severity and Description of Pain Patient Has Paino No Site Locations With Dressing Change: No Pain Management and Medication Current Pain Management: Electronic Signature(s) Signed: 12/11/2016 4:49:53 PM By: Regan Lemming [name], RN Entered By: Regan Lemming on 12/11/2016 14:40:51 [name] -------------------------------------------------------------------------------- Patient/Caregiver Education Details Patient Name: [name], [name]. Date of Service: 12/11/2016 2:45 PM Medical Record Patient Account Number: [id number] Number: Treating RN: [name], RN, [name], Rita 09/16/1946 [id number] y.o. Other Clinician: Date of Birth/Gender: Female) Treating ROBSON, Bell[place] Primary Care Physician: [name] Physician/Extender: G Referring Physician: Donzetta Sprung in Treatment: 4 Education Assessment Education Provided To: Patient Education Topics Provided Venous: Methods: Explain/Verbal Responses: State content correctly Welcome To [facility]: Methods: Explain/Verbal Responses: State content correctly Wound/Skin Impairment: Methods: Explain/Verbal Responses: State content correctly Electronic Signature(s) Signed: 12/11/2016 4:49:53 PM By:  Regan Lemming [name], RN Entered By: Regan Lemming on 12/11/2016 16:35:55 Copenhaver, [name] -------------------------------------------------------------------------------- Wound Assessment Details Patient Name: [name], [name]. Date of Service: 12/11/2016 2:45 PM Medical Record Number: [id number] Patient Account Number: [id number] Date of Birth/Sex: Aug 21, 1946 (71 y.o. Female) Treating RN: Afful, RN, [name], [place]our[place] Primary Care Scotland Korver: [name] Other Clinician: Referring Dinh Ayotte: [name] Treating Arthor Gorter/Extender: Tito Dine in Treatment: 4 Wound Status Wound Number: 2 Primary Atypical Etiology: Wound Location: Left Lower Leg - Medial Wound Open Wounding Event: Gradually Appeared Status: Date Acquired: 10/10/2016 Comorbid Cataracts, Asthma, Sleep Apnea, Deep Weeks Of Treatment: 4 History: Vein Thrombosis, Hypertension, Clustered Wound: No Peripheral Venous Disease, Osteoarthritis, Received Chemotherapy, Received Radiation Photos Photo Uploaded By: Regan Lemming on 12/11/2016 16:39:11 Wound Measurements Length: (cm) 0.5 Width: (cm) 0.5 Depth: (cm) 0.1 Area: (cm) 0.196 Volume: (cm) 0.02 % Reduction in Area: 95.8% % Reduction in Volume: 95.8% Epithelialization: Large (67-100%) Tunneling: No Undermining: No Wound Description Classification: Partial Thickness Foul Odor Aft Wound Margin: Distinct, outline attached Slough/Fibrin Exudate Amount: Small Exudate Type: Serous Exudate Color: amber er Cleansing: No o No Wound Bed Granulation Amount: Large (67-100%) Exposed Structure Granulation Quality: Friable Fascia Exposed: No [name], [name] ([id number]) Necrotic Amount: None Present (0%) Fat Layer (Subcutaneous Tissue) Exposed: No Tendon Exposed: No Muscle Exposed: No Joint Exposed: No Bone Exposed: No Limited to Skin Breakdown Periwound Skin Texture Texture Color No Abnormalities Noted: No No Abnormalities Noted: No Callus:  No Atrophie Blanche: Yes Crepitus: No Cyanosis: No Excoriation: No Ecchymosis: No Induration: No Erythema: No Rash: No Hemosiderin Staining: Yes Scarring: No Mottled: Yes Pallor: No Moisture Rubor: No No Abnormalities Noted: No Dry / Scaly: No Temperature / Pain  Maceration: No Temperature: No Abnormality Tenderness on Palpation: Yes Wound Preparation Ulcer Cleansing: Rinsed/Irrigated with Saline, Other: surg scrub and water, Topical Anesthetic Applied: Other: lidocaine 4%, Treatment Notes Wound #2 (Left, Medial Lower Leg) 1. Cleansed with: Cleanse wound with antibacterial soap and water 4. Dressing Applied: Promogran 5. Secondary Dressing Applied ABD Pad 7. Secured with 3 Layer Compression System - Right Lower Extremity Notes TCA Electronic Signature(s) Signed: 12/11/2016 4:49:53 PM By: Regan Lemming [name], RN Entered By: Regan Lemming on 12/11/2016 14:48:24 [name] -------------------------------------------------------------------------------- Vitals Details Patient Name: ASIALYNN, FILBRUN. Date of Service: 12/11/2016 2:45 PM Medical Record Number: [id number] Patient Account Number: [id number] Date of Birth/Sex: 1946/08/10 (71 y.o. Female) Treating RN: Afful, RN, [name], Velva Harman Primary Care Astra Gregg: [name] Other Clinician: Referring Rayon Mcchristian: [name] Treating Trannie Bardales/Extender: Tito Dine in Treatment: 4 Vital Signs Time Taken: 14:40 Temperature (F): 98.1 Height (in): 63 Pulse (bpm): 72 Weight (lbs): 218 Respiratory Rate (breaths/min): 16 Body Mass Index (BMI): 38.6 Blood Pressure (mmHg): 129/73 Reference Range: 80 - 120 mg / dl Electronic Signature(s) Signed: 12/11/2016 4:49:53 PM By: Regan Lemming [name], RN Entered By: Regan Lemming on 12/11/2016 14:42:58

## 2016-12-13 ENCOUNTER — Encounter: Payer: Self-pay | Admitting: Pulmonary Disease

## 2016-12-13 ENCOUNTER — Ambulatory Visit (INDEPENDENT_AMBULATORY_CARE_PROVIDER_SITE_OTHER): Payer: Medicare Other | Admitting: Pulmonary Disease

## 2016-12-13 ENCOUNTER — Telehealth: Payer: Self-pay

## 2016-12-13 DIAGNOSIS — J4520 Mild intermittent asthma, uncomplicated: Secondary | ICD-10-CM | POA: Diagnosis not present

## 2016-12-13 DIAGNOSIS — G4733 Obstructive sleep apnea (adult) (pediatric): Secondary | ICD-10-CM

## 2016-12-13 NOTE — Patient Instructions (Signed)
CPAP supplies will be renewed for a year 

## 2016-12-13 NOTE — Addendum Note (Signed)
Addended by: Len Blalock on: 12/13/2016 04:22 PM   Modules accepted: Orders

## 2016-12-13 NOTE — Progress Notes (Signed)
[name], [name] ([id number]) Visit Report for 12/11/2016 Chief Complaint Document Details Patient Name: [name]. Date of Service: 12/11/2016 2:45 PM Medical Record Patient Account Number: [id number] Number: Treating RN: [name], RN, [name], Rita 1946/08/14 [phone] y.o. Other Clinician: Date of Birth/Sex: Female) Treating Daleen Steinhaus Primary Care Provider: [name] Provider/Extender: G Referring Provider: Donzetta Sprung in Treatment: 4 Information Obtained from: Patient Chief Complaint Left calf venous stasis ulceration. 11/13/16; the patient is here for a left calf recurrent ulceration which is painful and has been present for the last month Electronic Signature(s) Signed: 12/12/2016 4:28:51 PM By: Linton Ham MD Entered By: Linton Ham on 12/11/2016 17:13:30 [name], [name] -------------------------------------------------------------------------------- HPI Details Patient Name: [name], [name]. Date of Service: 12/11/2016 2:45 PM Medical Record Patient Account Number: [id number] Number: Treating RN: [name], RN, [name], Rita [birth year] [phone] y.o. Other Clinician: Date of Birth/Sex: Female) Treating Norbert Malkin Primary Care Provider: [name] Provider/Extender: G Referring Provider: Donzetta Sprung in Treatment: 4 History of Present Illness HPI Description: Pleasant 71 year old with history of chronic venous insufficiency. No diabetes or peripheral vascular disease. Left ABI 1.29. Questionable history of left lower extremity DVT. She developed a recurrent ulceration on her left lateral calf in December 2015, which she attributes to poor diet and subsequent lower extremity edema. She underwent endovenous laser ablation of her left greater saphenous vein in 2010. She underwent laser ablation of accessory branch of left GSV in April 2016 by Dr. Kellie Simmering at [facility]. She was previously wearing Unna boots, which she  tolerated well. Tolerating 2 layer compression and cadexomer iodine. She returns to clinic for follow-up and is without new complaints. She denies any significant pain at this time. She reports persistent pain with pressure. No claudication or ischemic rest pain. No fever or chills. No drainage. READMISSION 11/13/16; this is a 71 year old woman who is not a diabetic. She is here for a review of a painful area on her left medial lower extremity. I note that she was seen here previously last year for wound I believe to be in the same area. At that time she had undergone previously a left greater saphenous vein ablation by Dr. Kellie Simmering and she had a ablation of the anterior accessory branch of the left greater saphenous vein in March 2016. Seeing that the wound actually closed over. In reviewing the history with her today the ulcer in this area has been recurrent. She describes a biopsy of this area in 2009 that only showed stasis physiology. 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 Do not see that she has ever had arterial studies in the left leg. She tells me she has a follow-up with Dr. Kellie Simmering in roughly 10 days In any case she developed the reopening of this area roughly a month ago. On the background of this she describes rapidly increasing edema which has responded to Lasix 40 mg and metolazone 2.5 mg as well as the patient's lymph massage. She has been told she has both venous insufficiency and lymphedema but she cannot tolerate compression stockings 11/28/16; the patient saw Dr. Kellie Simmering recently. Per the patient he did arterial Dopplers in the office that did not show evidence of arterial insufficiency, per the patient he stated "treat this like  an ordinary venous ulcer". She  also saw her dermatologist Dr. Ronnald Ramp who felt that this was more of a vascular ulcer. In general things are improving although she arrives today with increasing bilateral lower extremity edema with weeping a deeper fluid through the wound on the left medial leg compatible with some degree of lymphedema 12/04/16; the patient's wound is fully epithelialized but I don't think fully healed. We will do another week of depression with Promogran and TCA however I suspect we'll be able to discharge her next week. This is a very unusual-looking wound which was initially a figure-of-eight type wound lying on its side surrounded by [name] petechial like hemorrhage. She has had venous ablation on this side. She apparently does not have an arterial issue per Dr. Kellie Simmering. She saw her dermatologist thought it was "vascular". Patient is definitely going to need ongoing compression and I talked about this with her today she will go to elastic therapy after she leaves here next week 12/11/16; the patient's wound is not completely closed today. She has surrounding scar tissue and in further discussion with the patient it would appear that she had ulcers in this area in 2009 for a prolonged period of time ultimately requiring a punch biopsy of this area that only showed venous insufficiency. I did not previously pickup on this part of the history from the patient. Electronic Signature(s) Signed: 12/12/2016 4:28:51 PM By: Linton Ham MD Entered By: Linton Ham on 12/11/2016 17:14:43 [name], [name] -------------------------------------------------------------------------------- Physical Exam Details Patient Name: [name], [name]. Date of Service: 12/11/2016 2:45 PM Medical Record Patient Account Number: [id number] Number: Treating RN: [name], RN, [name], Rita Nov 15, 1945 [phone] y.o. Other Clinician: Date of Birth/Sex: Female) Treating Abubakar Crispo Primary Care Provider:  [name] Provider/Extender: G Referring Provider: Donzetta Sprung in Treatment: 4 Constitutional Sitting or standing Blood Pressure is within target range for patient.. Pulse regular and within target range for patient.Marland Kitchen Respirations regular, non-labored and within target range.. Temperature is normal and within the target range for the patient.. Patient's appearance is neat and clean. Appears in no acute distress. Well nourished and well developed.. Notes Wound exam; the areas on her left medial leg; this is epithelialized except and a small portion. Small petechial hemorrhages are better. The wounds that I labeled his recurrent were actually from 2009 to 2010. She tells me she had a punch biopsy at that point. Electronic Signature(s) Signed: 12/12/2016 4:28:51 PM By: Linton Ham MD Entered By: Linton Ham on 12/11/2016 17:16:05 [name] -------------------------------------------------------------------------------- Physician Orders Details Patient Name: [name], [name]. Date of Service: 12/11/2016 2:45 PM Medical Record Patient Account Number: [id number] Number: Treating RN: [name], RN, [name], Rita 22-Jul-1946 [phone] y.o. Other Clinician: Date of Birth/Sex: Female) Treating Sarin Comunale Primary Care Provider: [name] Provider/Extender: G Referring Provider: Donzetta Sprung in Treatment: 4 Verbal / Phone Orders: No Diagnosis Coding Wound Cleansing Wound #2 Left,Medial Lower Leg o Cleanse wound with mild soap and water o May Shower, gently pat wound dry prior to applying new dressing. o May shower with protection. Skin Barriers/Peri-Wound Care Wound #2 Left,Medial Lower Leg o Triamcinolone Acetonide Ointment Primary Wound Dressing Wound #2 Left,Medial Lower Leg o Promogran Secondary Dressing Wound #2 Left,Medial Lower Leg o ABD pad Dressing Change Frequency Wound #2 Left,Medial Lower Leg o  Change dressing every week Follow-up Appointments Wound #2 Left,Medial Lower Leg o Return Appointment in 1 week. o Nurse Visit as needed Edema Control Wound #  2 Left,Medial Lower Leg o 3 Layer Compression System - Left Lower Extremity o Elevate legs to the level of the heart and pump ankles as often as possible Additional Orders / Instructions [name], [name]. ([id number]) Wound #2 Left,Medial Lower Leg o Increase protein intake. o Activity as tolerated Home Health Wound #2 Left,Medial Lower Leg o [place] Visits - [place] Nurse may visit PRN to address patientos wound care needs. o FACE TO FACE ENCOUNTER: MEDICARE and MEDICAID PATIENTS: I certify that this patient is under my care and that I had a face-to-face encounter that meets the physician face-to-face encounter requirements with this patient on this date. The encounter with the patient was in whole or in part for the following MEDICAL CONDITION: (primary reason for [place]) MEDICAL NECESSITY: I certify, that based on my findings, NURSING services are a medically necessary home health service. HOME BOUND STATUS: I certify that my clinical findings support that this patient is homebound (i.e., Due to illness or injury, pt requires aid of supportive devices such as crutches, cane, wheelchairs, walkers, the use of special transportation or the assistance of another person to leave their place of residence. There is a normal inability to leave the home and doing so requires considerable and taxing effort. Other absences are for medical reasons / religious services and are infrequent or of short duration when for other reasons). o If current dressing causes regression in wound condition, may D/C ordered dressing product/s and apply Normal Saline Moist Dressing daily until next [facility] / Other MD appointment. [place] of regression in wound  condition at [phone]. o Please direct any NON-WOUND related issues/requests for orders to patient's Primary Care Physician Electronic Signature(s) Signed: 12/11/2016 4:49:53 PM By: Regan Lemming [name], RN Signed: 12/12/2016 4:28:51 PM By: Linton Ham MD Entered By: Regan Lemming on 12/11/2016 15:11:17 [name], [name] ([id number]) -------------------------------------------------------------------------------- Problem List Details Patient Name: YENY, HUKILL. Date of Service: 12/11/2016 2:45 PM Medical Record Patient Account Number: [id number] Number: Treating RN: [name], RN, [name], Rita [birth year] [phone] y.o. Other Clinician: Date of Birth/Sex: Female) Treating Harland Aguiniga Primary Care Provider: [name] Provider/Extender: G Referring Provider: Donzetta Sprung in Treatment: 4 Active Problems ICD-10 Encounter Code Description Active Date Diagnosis L97.221 Non-pressure chronic ulcer of left calf limited to 11/13/2016 Yes breakdown of skin I87.332 Chronic venous hypertension (idiopathic) with ulcer and 11/13/2016 Yes inflammation of left lower extremity I89.0 Lymphedema, not elsewhere classified 11/13/2016 Yes Inactive Problems Resolved Problems Electronic Signature(s) Signed: 12/12/2016 4:28:51 PM By: Linton Ham MD Entered By: Linton Ham on 12/11/2016 17:12:05 [name] -------------------------------------------------------------------------------- Progress Note Details Patient Name: [name], [name]. Date of Service: 12/11/2016 2:45 PM Medical Record Patient Account Number: [id number] Number: Treating RN: [name], RN, [name], Rita Sep 25, 1946 [phone] y.o. Other Clinician: Date of Birth/Sex: Female) Treating Miko Sirico Primary Care Provider: [name] Provider/Extender: G Referring Provider: Donzetta Sprung in Treatment: 4 Subjective Chief Complaint Information obtained from Patient Left calf venous  stasis ulceration. 11/13/16; the patient is here for a left calf recurrent ulceration which is painful and has been present for the last month History of Present Illness (HPI) Pleasant 71 year old with history of chronic venous insufficiency. No diabetes or peripheral vascular disease. Left ABI 1.29. Questionable history of left lower extremity DVT. She developed a recurrent ulceration on her left lateral calf in December 2015, which she attributes to poor diet and subsequent lower extremity edema. She underwent  endovenous laser ablation of her left greater saphenous vein in 2010. She underwent laser ablation of accessory branch of left GSV in April 2016 by Dr. Kellie Simmering at [facility]. She was previously wearing Unna boots, which she tolerated well. Tolerating 2 layer compression and cadexomer iodine. She returns to clinic for follow-up and is without new complaints. She denies any significant pain at this time. She reports persistent pain with pressure. No claudication or ischemic rest pain. No fever or chills. No drainage. READMISSION 11/13/16; this is a 71 year old woman who is not a diabetic. She is here for a review of a painful area on her left medial lower extremity. I note that she was seen here previously last year for wound I believe to be in the same area. At that time she had undergone previously a left greater saphenous vein ablation by Dr. Kellie Simmering and she had a ablation of the anterior accessory branch of the left greater saphenous vein in March 2016. Seeing that the wound actually closed over. In reviewing the history with her today the ulcer in this area has been recurrent. She describes a biopsy of this area in 2009 that only showed stasis physiology. 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 Do not see that she has ever had arterial studies in the left leg. She tells me she has a follow-up with Dr. Kellie Simmering in roughly 10 days In any case she developed the reopening of this area roughly a month ago. On the background of this she describes rapidly increasing edema which has responded to Lasix 40 mg and metolazone 2.5 mg as well as the patient's lymph massage. She has been told she has both venous insufficiency and lymphedema but she cannot tolerate compression stockings 11/28/16; the patient saw Dr. Kellie Simmering recently. Per the patient he did arterial Dopplers in the office that did not show evidence of arterial insufficiency, per the patient he stated "treat this like an ordinary venous Stepter, Kahmya J. ([id number]) ulcer". She also saw her dermatologist Dr. Ronnald Ramp who felt that this was more of a vascular ulcer. In general things are improving although she arrives today with increasing bilateral lower extremity edema with weeping a deeper fluid through the wound on the left medial leg compatible with some degree of lymphedema 12/04/16; the patient's wound is fully epithelialized but I don't think fully healed. We will do another week of depression with Promogran and TCA however I suspect we'll be able to discharge her next week. This is a very unusual-looking wound which was initially a figure-of-eight type wound lying on its side surrounded by petechial like hemorrhage. She has had venous ablation on this side. She apparently does not have an arterial issue per Dr. Kellie Simmering. She saw her dermatologist thought it was "vascular". Patient is definitely going to need ongoing compression and I talked about this with her today she will go to elastic therapy after she leaves here next week 12/11/16; the patient's wound is not completely closed today. She has surrounding scar tissue and in further discussion with the patient it would appear that she had ulcers in this area in  2009 for a prolonged period of time ultimately requiring a punch biopsy of this area that only showed venous insufficiency. I did not previously pickup  on this part of the history from the patient. Objective Constitutional Sitting or standing Blood Pressure is within target range for patient.. Pulse regular and within target range for patient.Marland Kitchen Respirations regular, non-labored and within target range.. Temperature is normal and within the target range for the patient.. Patient's appearance is neat and clean. Appears in no acute distress. Well nourished and well developed.. Vitals Time Taken: 2:40 PM, Height: 63 in, Weight: 218 lbs, BMI: 38.6, Temperature: 98.1 F, Pulse: 72 bpm, Respiratory Rate: 16 breaths/min, Blood Pressure: 129/73 mmHg. General Notes: Wound exam; the areas on her left medial leg; this is epithelialized except and a small portion. Small petechial hemorrhages are better. The wounds that I labeled his recurrent were actually from 2009 to 2010. She tells me she had a punch biopsy at that point. Integumentary (Hair, Skin) Wound #2 status is Open. Original cause of wound was Gradually Appeared. The wound is located on the Left,Medial Lower Leg. The wound measures 0.5cm length x 0.5cm width x 0.1cm depth; 0.196cm^2 area and 0.02cm^3 volume. The wound is limited to skin breakdown. There is no tunneling or undermining noted. There is a small amount of serous drainage noted. The wound margin is distinct with the outline attached to the wound base. There is large (67-100%) friable granulation within the wound bed. There is no necrotic tissue within the wound bed. The periwound skin appearance exhibited: Atrophie Blanche, Hemosiderin Staining, Mottled. The periwound skin appearance did not exhibit: Callus, Crepitus, Excoriation, Induration, Rash, Scarring, Dry/Scaly, Maceration, Cyanosis, Ecchymosis, Pallor, Rubor, Erythema. Periwound temperature was noted as No Abnormality. The  periwound has tenderness on palpation. [name], [name] ([id number]) Assessment Active Problems ICD-10 [id number] - Non-pressure chronic ulcer of left calf limited to breakdown of skin I87.332 - Chronic venous hypertension (idiopathic) with ulcer and inflammation of left lower extremity I89.0 - Lymphedema, not elsewhere classified Plan Wound Cleansing: Wound #2 Left,Medial Lower Leg: Cleanse wound with mild soap and water May Shower, gently pat wound dry prior to applying new dressing. May shower with protection. Skin Barriers/Peri-Wound Care: Wound #2 Left,Medial Lower Leg: Triamcinolone Acetonide Ointment Primary Wound Dressing: Wound #2 Left,Medial Lower Leg: Promogran Secondary Dressing: Wound #2 Left,Medial Lower Leg: ABD pad Dressing Change Frequency: Wound #2 Left,Medial Lower Leg: Change dressing every week Follow-up Appointments: Wound #2 Left,Medial Lower Leg: Return Appointment in 1 week. Nurse Visit as needed Edema Control: Wound #2 Left,Medial Lower Leg: 3 Layer Compression System - Left Lower Extremity Elevate legs to the level of the heart and pump ankles as often as possible Additional Orders / Instructions: Wound #2 Left,Medial Lower Leg: Increase protein intake. Activity as tolerated Home Health: Wound #2 Left,Medial Lower Leg: [name] Visits - [facility] [name], [name] ([id number]) Home Health Nurse may visit PRN to address patient s wound care needs. FACE TO FACE ENCOUNTER: MEDICARE and MEDICAID PATIENTS: I certify that this patient is under my care and that I had a face-to-face encounter that meets the physician face-to-face encounter requirements with this patient on this date. The encounter with the patient was in whole or in part for the following MEDICAL CONDITION: (primary reason for [place]) MEDICAL NECESSITY: I certify, that based on my findings, NURSING services are a medically necessary home health service. HOME BOUND  STATUS: I certify that my clinical findings support that this patient is homebound (i.e., Due to illness or injury, pt requires aid of supportive devices such as crutches, cane, wheelchairs, walkers, the use of special transportation or the assistance  of another person to leave their place of residence. There is a normal inability to leave the home and doing so requires considerable and taxing effort. Other absences are for medical reasons / religious services and are infrequent or of short duration when for other reasons). If current dressing causes regression in wound condition, may D/C ordered dressing product/s and apply Normal Saline Moist Dressing daily until next [place] / Other MD appointment. [place] of regression in wound condition at [phone]. Please direct any NON-WOUND related issues/requests for orders to patient's Primary Care Physician continue TCA and promaogran She will go to elastic therapy after she leaves this clinic Electronic Signature(s) Signed: 12/12/2016 4:28:51 PM By: Linton Ham MD Entered By: Linton Ham on 12/11/2016 [name], [name] -------------------------------------------------------------------------------- SuperBill Details Patient Name: CAMEREN, DEMELLO. Date of Service: 12/11/2016 Medical Record Patient Account Number: [id number] Number: Treating RN: [name], RN, [name], Rita 08/05/1946 [phone] y.o. Other Clinician: Date of Birth/Sex: Female) Treating Merie Wulf Primary Care Provider: [name] Provider/Extender: G Referring Provider: Donzetta Sprung in Treatment: 4 Diagnosis Coding ICD-10 Codes Code Description [phone] Non-pressure chronic ulcer of left calf limited to breakdown of skin Chronic venous hypertension (idiopathic) with ulcer and inflammation of left lower I87.332 extremity I89.0 Lymphedema, not elsewhere classified Facility Procedures CPT4:  Description Modifier Quantity Code [id number] (Facility Use Only) [phone] - APPLY [place] LT 1 LEG Physician Procedures CPT4 Code Description: [id number] - WC PHYS LEVEL 2 - EST PT ICD-10 Description Diagnosis L97.221 Non-pressure chronic ulcer of left calf limited to Modifier: breakdown of ski Quantity: 1 n Electronic Signature(s) Signed: 12/12/2016 4:28:51 PM By: Linton Ham MD Previous Signature: 12/11/2016 4:49:53 PM Version By: Regan Lemming [name], RN Entered By: Linton Ham on 12/11/2016 17:17:29

## 2016-12-13 NOTE — Assessment & Plan Note (Signed)
CPAP supplies will be renewed for a year Download will be checked  Weight loss encouraged, compliance with goal of at least 4-6 hrs every night is the expectation. Advised against medications with sedative side effects Cautioned against driving when sleepy - understanding that sleepiness will vary on a day to day basis

## 2016-12-13 NOTE — Telephone Encounter (Signed)
Per RA- cpap download from [facility] received.  Download shows good compliance, control, no episodes throughout the night. lmtcb X1 to make pt aware.    Will route to [facility] for follow up.

## 2016-12-13 NOTE — Assessment & Plan Note (Addendum)
Repeat scanning for inguinal lymphadenopathy is planned by oncology. This is likely related to her chronic ulcer on her left lower extremity

## 2016-12-13 NOTE — Assessment & Plan Note (Signed)
Continue Singulair Use albuterol as needed only

## 2016-12-13 NOTE — Progress Notes (Signed)
   Subjective:    Patient ID: [name], female    DOB: 13-Nov-1945, 71 y.o.   MRN: [id number]  HPI  71 year old  woman for followup of severe OSA & asthma  Moved from [place] 2010 - she was diagnosed with asthma in her 81s and took Advair for a long period of time.  She was treated for melanoma from 2011-2013 requiring interferon therapy. She has Cervical disc disease and underwent fusion  She took neurotin for RLS for years   12/13/2016  Chief Complaint  Patient presents with  . Follow-up    last seen 04/2015- here to re-establish care.  Pt recently had CT abdomen through Dr. Marin Olp, was recommended to come back in by him.    She developed lymphedema after cervical fusion surgery. She has a Horner syndrome. She also developed an ulcer on the left leg and is seeing wound care, is using minimal elevation and compression. Echo showed normal LV function with RVSP 36 Vascular evaluation including arterial Dopplers were negative for circulation issues CT abdomen showed new bilateral nonspecific inguinal lymphadenopathy with one area of concern in the left inguinal region and a repeat scan is scheduled by Dr. Marin Olp. Her asthma has been well controlled-she is off Advair and only takes Singulair. She is back on Neurontin 100 mg at bedtime She complains of excessive somnolence, but has been receiving :-) is on her CPAP machine, she is very compliant by report and has no problems with mask and pressure, has been receiving supplies and time DME is [facility]  Significant tests/ events  PSG 2003 which showed AHI of 81 events per hour and lowest desaturation to 57%. This was corrected by CPAP of 12 cm in with residual events of 5.6 events per hour on a study in 2007. CPAP 11/05/13 to 03/24/14 >> used on 140 of 140 nights with average 4 hrs and 36 min. Average AHI is 6.5 with median CPAP 12 cm H2O.   PFTs 04/2014- no airway obs, ratio ok, FEV1 80%, TLC & DLCO nml   Review of  Systems Patient denies significant dyspnea,cough, hemoptysis,  chest pain, palpitations, pedal edema, orthopnea, paroxysmal nocturnal dyspnea, lightheadedness, nausea, vomiting, abdominal or  leg pains      Objective:   Physical Exam  Gen. Pleasant, obese, in no distress ENT - no lesions, no post nasal drip Neck: No JVD, no thyromegaly, no carotid bruits Lungs: no use of accessory muscles, no dullness to percussion, decreased without rales or rhonchi  Cardiovascular: Rhythm regular, heart sounds  normal, no murmurs or gallops, no peripheral edema Musculoskeletal: No deformities, no cyanosis or clubbing , no tremors       Assessment & Plan:

## 2016-12-14 ENCOUNTER — Encounter: Payer: Medicare Other | Admitting: Occupational Therapy

## 2016-12-14 NOTE — Telephone Encounter (Signed)
Spoke with pt. She is aware of CPAP download results. Nothing further was needed.

## 2016-12-14 NOTE — Telephone Encounter (Signed)
Patient returned call, CB is [phone].

## 2016-12-17 ENCOUNTER — Encounter: Payer: Medicare Other | Admitting: Occupational Therapy

## 2016-12-18 ENCOUNTER — Encounter: Payer: Medicare Other | Admitting: Internal Medicine

## 2016-12-18 DIAGNOSIS — L97221 Non-pressure chronic ulcer of left calf limited to breakdown of skin: Secondary | ICD-10-CM | POA: Diagnosis not present

## 2016-12-18 DIAGNOSIS — Z87891 Personal history of nicotine dependence: Secondary | ICD-10-CM | POA: Diagnosis not present

## 2016-12-18 DIAGNOSIS — I87332 Chronic venous hypertension (idiopathic) with ulcer and inflammation of left lower extremity: Secondary | ICD-10-CM | POA: Diagnosis not present

## 2016-12-18 DIAGNOSIS — Z882 Allergy status to sulfonamides status: Secondary | ICD-10-CM | POA: Diagnosis not present

## 2016-12-18 DIAGNOSIS — I890 Lymphedema, not elsewhere classified: Secondary | ICD-10-CM | POA: Diagnosis not present

## 2016-12-18 DIAGNOSIS — L97229 Non-pressure chronic ulcer of left calf with unspecified severity: Secondary | ICD-10-CM | POA: Diagnosis not present

## 2016-12-18 DIAGNOSIS — I10 Essential (primary) hypertension: Secondary | ICD-10-CM | POA: Diagnosis not present

## 2016-12-18 DIAGNOSIS — I87312 Chronic venous hypertension (idiopathic) with ulcer of left lower extremity: Secondary | ICD-10-CM | POA: Diagnosis not present

## 2016-12-19 ENCOUNTER — Encounter: Payer: Medicare Other | Admitting: Occupational Therapy

## 2016-12-19 DIAGNOSIS — H43813 Vitreous degeneration, bilateral: Secondary | ICD-10-CM | POA: Diagnosis not present

## 2016-12-19 DIAGNOSIS — H25813 Combined forms of age-related cataract, bilateral: Secondary | ICD-10-CM | POA: Diagnosis not present

## 2016-12-19 DIAGNOSIS — D3132 Benign neoplasm of left choroid: Secondary | ICD-10-CM | POA: Diagnosis not present

## 2016-12-19 DIAGNOSIS — G902 Horner's syndrome: Secondary | ICD-10-CM | POA: Diagnosis not present

## 2016-12-19 NOTE — Progress Notes (Addendum)
[name], [name] Visit Report for 12/18/2016 Arrival Information Details Patient Name: [name], [name]. Date of Service: 12/18/2016 2:45 PM Medical Record Number: [id number] Patient Account Number: [id number] Date of Birth/Sex: 11-06-1945 (71 y.o. Female) Treating RN: [name] Primary Care [name]: [name] Other Clinician: Referring [name]: [name] Treating Shalene Gallen/Extender: [name] in Treatment: 5 Visit Information History Since Last Visit Added or deleted any medications: No Patient Arrived: Ambulatory Any new allergies or adverse reactions: No Arrival Time: 14:58 Had a fall or experienced change in No Accompanied By: self activities of daily living that may affect Transfer Assistance: None risk of falls: Patient Identification Verified: Yes Signs or symptoms of abuse/neglect since last No Secondary Verification Process Yes visito Completed: Has Dressing in Place as Prescribed: Yes Patient Requires Transmission-Based No Pain Present Now: No Precautions: Patient Has Alerts: No Electronic Signature(s) Signed: 12/18/2016 4:41:51 PM By: Amber Cool, RN, [name], [name], [name] Entered By: Amber Cool, RN, [name], Kim on 12/18/2016 14:58:57 [name], [name] -------------------------------------------------------------------------------- Compression Therapy Details Patient Name: [name]. Date of Service: 12/18/2016 2:45 PM Medical Record Number: [id number] Patient Account Number: [id number] Date of Birth/Sex: 04/11/1946 (71 y.o. Female) Treating RN: [name] Primary Care [name]: [name] Other Clinician: Referring [name]: [name] Treating Sophie Tamez/Extender: [name] in Treatment: 5 Compression Therapy Performed for Wound Wound #2RR Left,Medial Lower Leg Assessment: Performed By: Clinician Amber Barman, RN Compression Type: Three Layer Post Procedure Diagnosis Same as  Pre-procedure Electronic Signature(s) Signed: 12/18/2016 4:41:51 PM By: Amber Cool, RN, [name], [name], [name] Entered By: Amber Cool, RN, [name], Kim on 12/18/2016 15:29:06 [name], [name] -------------------------------------------------------------------------------- Encounter Discharge Information Details Patient Name: [name], [name]. Date of Service: 12/18/2016 2:45 PM Medical Record Number: [id number] Patient Account Number: [id number] Date of Birth/Sex: [birth year] (71 y.o. Female) Treating RN: Afful, RN, [name], [name] Primary Care [name]: [name] Other Clinician: Referring [name]: [name] Treating [name]/Extender: [name] in Treatment: 5 Encounter Discharge Information Items Discharge Pain Level: 0 Discharge Condition: Stable Ambulatory Status: Ambulatory Discharge Destination: Home Transportation: Private Auto Accompanied By: self Schedule Follow-up Appointment: Yes Medication Reconciliation completed and provided to Patient/Care Yes [name]: Provided on Clinical Summary of Care: 12/18/2016 Form Type Recipient Paper Patient LP Electronic Signature(s) Signed: 12/18/2016 4:41:51 PM By: [name] Previous Signature: 12/18/2016 3:29:17 PM Version By: Ruthine Dose Entered By: Amber Cool RN, [name], Kim on 12/18/2016 15:30:08 [name] -------------------------------------------------------------------------------- Lower Extremity Assessment Details Patient Name: [name], [name]. Date of Service: 12/18/2016 2:45 PM Medical Record Number: [id number] Patient Account Number: [id number] Date of Birth/Sex: 11-06-1945 (71 y.o. Female) Treating RN: [name] Primary Care [name]: [name] Other Clinician: Referring [name]: [name] Treating [name]/Extender: [name] in Treatment: 5 Edema Assessment Assessed: [Left: No] [Right: No] E[Left: dema] [Right: :] Calf Left:  Right: Point of Measurement: 10 cm From Medial Instep 40 cm cm Ankle Left: Right: Point of Measurement: 9 cm From Medial Instep 24.4 cm cm Vascular Assessment Claudication: Claudication Assessment [Left:None] Pulses: Dorsalis Pedis Palpable: [Left:Yes] Posterior Tibial Palpable: [Left:Yes] Doppler Audible: [Left:Yes] Extremity colors, hair growth, and conditions: Extremity Color: [Left:Normal] Hair Growth on Extremity: [Left:Yes] Temperature of Extremity: [Left:Warm] Capillary Refill: [Left:< 3 seconds] Dependent Rubor: [Left:No] Blanched when Elevated: [Left:No] Toe Nail Assessment Left: Right: Thick: Yes Discolored: Yes Deformed: Yes Improper Length and Hygiene: No [name] Electronic Signature(s) Signed: 12/18/2016 4:41:51 PM By: Amber Cool, RN, [name], [name], [name] Entered  By: Amber Cool, RN, [name], Kim on 12/18/2016 15:21:17 [name] [name] ([name]) -------------------------------------------------------------------------------- Multi Wound Chart Details Patient Name: [name], [name]. Date of Service: 12/18/2016 2:45 PM Medical Record Number: [name] Patient Account Number: [id number] Date of Birth/Sex: 08/02/1946 (71 y.o. Female) Treating RN: [name] Primary Care Kimble Delaurentis: [name] Other Clinician: Referring [name]: [name] Treating [name]/Extender: [name] in Treatment: 5 Vital Signs Height(in): 63 Pulse(bpm): 64 Weight(lbs): 218 Blood Pressure 116/65 (mmHg): Body Mass Index(BMI): 39 Temperature(F): 97.9 Respiratory Rate 16 (breaths/min): Photos: [N/A:N/A] Wound Location: Left, Medial Lower Leg N/A N/A Wounding Event: Gradually Appeared N/A N/A Primary Etiology: Atypical N/A N/A Comorbid History: Cataracts, Asthma, Sleep N/A N/A Apnea, Deep Vein Thrombosis, Hypertension, Peripheral Venous Disease, Osteoarthritis, Received Chemotherapy, Received Radiation Date Acquired: 10/10/2016 N/A N/A Weeks  of Treatment: 5 N/A N/A Wound Status: Healed - Epithelialized N/A N/A Wound Recurrence: Yes N/A N/A Measurements L x W x D 0.1x0.1x0.1 N/A N/A (cm) Area (cm) : 0 N/A N/A Volume (cm) : 0 N/A N/A % Reduction in Area: 100.00% N/A N/A % Reduction in Volume: 100.00% N/A N/A Classification: Partial Thickness N/A N/A Wound Margin: Indistinct, nonvisible N/A N/A Granulation Amount: Large (67-100%) N/A N/A Necrotic Amount: None Present (0%) N/A N/A [name], [name]. ([name]) Exposed Structures: Fascia: No N/A N/A Fat Layer (Subcutaneous Tissue) Exposed: No Tendon: No Muscle: No Joint: No Bone: No Limited to Skin Breakdown Epithelialization: Large (67-100%) N/A N/A Periwound Skin Texture: Scarring: Yes N/A N/A Excoriation: No Induration: No Callus: No Crepitus: No Rash: No Periwound Skin Maceration: No N/A N/A Moisture: Dry/Scaly: No Periwound Skin Color: Atrophie Blanche: No N/A N/A Cyanosis: No Ecchymosis: No Erythema: No Hemosiderin Staining: No Mottled: No Pallor: No Rubor: No Tenderness on No N/A N/A Palpation: Procedures Performed: Compression Therapy N/A N/A Treatment Notes Wound #2RR (Left, Medial Lower Leg) 1. Cleansed with: Cleanse wound with antibacterial soap and water 7. Secured with 3 Layer Compression System - Right Lower Extremity Notes TCA Electronic Signature(s) Signed: 12/19/2016 7:47:19 AM By: Linton Ham MD Previous Signature: 12/18/2016 4:41:51 PM Version By: [name] Entered By: Linton Ham on 12/19/2016 07:32:53 [name], [name] ([name]) -------------------------------------------------------------------------------- Multi-Disciplinary Care Plan Details Patient Name: [name], [name]. Date of Service: 12/18/2016 2:45 PM Medical Record Number: [name] Patient Account Number: [id number] Date of Birth/Sex: 04-20-1946 (71 y.o. Female) Treating RN: [name] Primary Care Hero Mccathern: [name] Other  Clinician: Referring Maryori Weide: [name] Treating Simmone Cape/Extender: [name] in Treatment: 5 Active Inactive ` Orientation to the Wound Care Program Nursing Diagnoses: Knowledge deficit related to the wound healing center program Goals: Patient/caregiver will verbalize understanding of the [place] Program Date Initiated: 11/13/2016 Target Resolution Date: 03/18/2017 Goal Status: Active Interventions: Provide education on orientation to the wound center Notes: ` Venous Leg Ulcer Nursing Diagnoses: Knowledge deficit related to disease process and management Potential for venous Insuffiency (use before diagnosis confirmed) Goals: Patient will maintain optimal edema control Date Initiated: 11/13/2016 Target Resolution Date: 03/18/2017 Goal Status: Active Patient/caregiver will verbalize understanding of disease process and disease management Date Initiated: 11/13/2016 Target Resolution Date: 03/18/2017 Goal Status: Active Verify adequate tissue perfusion prior to therapeutic compression application Date Initiated: 11/13/2016 Target Resolution Date: 03/18/2017 Goal Status: Active Interventions: Assess peripheral edema status every visit. Compression as ordered [name], [name] ([name]) Provide education on venous insufficiency Notes: ` Wound/Skin Impairment Nursing Diagnoses: Impaired tissue integrity Knowledge deficit related to ulceration/compromised skin integrity Goals: Patient/caregiver will verbalize understanding of skin  care regimen Date Initiated: 11/13/2016 Target Resolution Date: 03/18/2017 Goal Status: Active Ulcer/skin breakdown will have a volume reduction of 30% by week 4 Date Initiated: 11/13/2016 Target Resolution Date: 03/18/2017 Goal Status: Active Ulcer/skin breakdown will have a volume reduction of 50% by week 8 Date Initiated: 11/13/2016 Target Resolution Date: 03/18/2017 Goal Status: Active Ulcer/skin breakdown will have  a volume reduction of 80% by week 12 Date Initiated: 11/13/2016 Target Resolution Date: 03/18/2017 Goal Status: Active Ulcer/skin breakdown will heal within 14 weeks Date Initiated: 11/13/2016 Target Resolution Date: 03/18/2017 Goal Status: Active Interventions: Assess patient/caregiver ability to obtain necessary supplies Assess patient/caregiver ability to perform ulcer/skin care regimen upon admission and as needed Assess ulceration(s) every visit Provide education on ulcer and skin care Treatment Activities: Referred to DME Drenda Sobecki for dressing supplies : 11/13/2016 Skin care regimen initiated : 11/13/2016 Topical wound management initiated : 11/13/2016 Notes: Electronic Signature(s) Signed: 12/18/2016 4:41:51 PM By: Amber Cool, RN, [name], [name], [name] Entered By: Amber Cool, RN, [name], Kim on 12/18/2016 15:27:30 YEJIN, FAUSNAUGH ([id number] Zapalac, [name] ([name]) -------------------------------------------------------------------------------- Pain Assessment Details Patient Name: [name], FOREMAN. Date of Service: 12/18/2016 2:45 PM Medical Record Number: [name] Patient Account Number: [id number] Date of Birth/Sex: November 06, 1945 (71 y.o. Female) Treating RN: [name] Primary Care Clarissia Mckeen: [name] Other Clinician: Referring Deidrick Rainey: [name] Treating Elektra Wartman/Extender: [name] in Treatment: 5 Active Problems Location of Pain Severity and Description of Pain Patient Has Paino No Site Locations With Dressing Change: No Pain Management and Medication Current Pain Management: Electronic Signature(s) Signed: 12/18/2016 4:41:51 PM By: Amber Cool, RN, [name], [name], [name] Entered By: Amber Cool, RN, [name], Kim on 12/18/2016 14:59:02 Liebman, [name] ([name]) -------------------------------------------------------------------------------- Patient/Caregiver Education Details Patient Name: MANAIA, ACHTER. Date of Service: 12/18/2016 2:45 PM Medical Record Patient Account  Number: [id number] [name] Number: Treating RN: [name] 1946/08/07 (70 y.o. Other Clinician: Date of Birth/Gender: Female) Treating ROBSON, MICHAEL Primary Care Physician: [name] Physician/Extender: G Referring Physician: Donzetta Sprung in Treatment: 5 Education Assessment Education Provided To: Patient Education Topics Provided Venous: Handouts: Controlling Swelling with Compression Stockings Methods: Demonstration Responses: State content correctly Electronic Signature(s) Signed: 12/18/2016 4:41:51 PM By: Amber Cool, RN, [name], [name], [name] Entered By: Amber Cool, RN, [name], Kim on 12/18/2016 15:30:23 AVERIL, NETZER ([name]) -------------------------------------------------------------------------------- Wound Assessment Details Patient Name: LIZZETE, HOSSFELD. Date of Service: 12/18/2016 2:45 PM Medical Record Number: [name] Patient Account Number: [id number] Date of Birth/Sex: 12-02-1945 (71 y.o. Female) Treating RN: [name] Primary Care Mylene Bow: [name] Other Clinician: Referring Daianna Vasques: [name] Treating Tarahji Ramthun/Extender: [name] in Treatment: 5 Wound Status Wound Number: 2 Primary Atypical Etiology: Wound Location: Left, Medial Lower Leg Wound Healed - Epithelialized Wounding Event: Gradually Appeared Status: Date Acquired: 10/10/2016 Comorbid Cataracts, Asthma, Sleep Apnea, Deep Weeks Of Treatment: 5 History: Vein Thrombosis, Hypertension, Clustered Wound: No Peripheral Venous Disease, Osteoarthritis, Received Chemotherapy, Received Radiation Photos Wound Measurements Length: (cm) 0.1 Width: (cm) 0.1 Depth: (cm) 0.1 Area: (cm) 0 Volume: (cm) 0 % Reduction in Area: 100% % Reduction in Volume: 100% Epithelialization: Large (67-100%) Tunneling: No Undermining: No Wound Description Classification: Partial Thickness Wound Margin: Indistinct, nonvisible Wound Bed Granulation Amount: Large  (67-100%) Exposed Structure Necrotic Amount: None Present (0%) Fascia Exposed: No Fat Layer (Subcutaneous Tissue) Exposed: No Tendon Exposed: No Muscle Exposed: No Joint Exposed: No Bone Exposed: No Limited to Skin Breakdown Leppert, Shavelle J. ([name]) Periwound Skin Texture Texture Color No Abnormalities Noted: No No Abnormalities Noted: No Callus: No Atrophie  Blanche: No Crepitus: No Cyanosis: No Excoriation: No Ecchymosis: No Induration: No Erythema: No Rash: No Hemosiderin Staining: No Scarring: Yes Mottled: No Pallor: No Moisture Rubor: No No Abnormalities Noted: No Dry / Scaly: No Maceration: No Electronic Signature(s) Signed: 12/18/2016 4:41:51 PM By: Amber Cool, RN, [name], [name], [name] Entered By: Amber Cool, RN, [name], Kim on 12/18/2016 15:20:14 [name] -------------------------------------------------------------------------------- Vitals Details Patient Name: DAVONNA, ROUND. Date of Service: 12/18/2016 2:45 PM Medical Record Number: [id number] Patient Account Number: [id number] Date of Birth/Sex: 1946/05/15 (71 y.o. Female) Treating RN: [name] Primary Care Jaqua Ching: [name] Other Clinician: Referring Marrie Chandra: [name] Treating Merrily Tegeler/Extender: [name] in Treatment: 5 Vital Signs Time Taken: 15:00 Temperature (F): 97.9 Height (in): 63 Pulse (bpm): 64 Weight (lbs): 218 Respiratory Rate (breaths/min): 16 Body Mass Index (BMI): 38.6 Blood Pressure (mmHg): 116/65 Reference Range: 80 - 120 mg / dl Electronic Signature(s) Signed: 12/18/2016 4:41:51 PM By: Amber Cool, RN, [name], [name], [name] Entered By: Amber Cool, RN, [name], Kim on 12/18/2016 15:01:09

## 2016-12-19 NOTE — Progress Notes (Signed)
Amber, [name] ([id number]) Visit Report for 12/18/2016 Chief Complaint Document Details Patient Name: [name], [name]. Date of Service: 12/18/2016 2:45 PM Medical Record Patient Account Number: [id number] Number: Treating RN: [name], RN, [name], Rita Dec 19, 1945 [phone] y.o. Other Clinician: Date of Birth/Sex: Female) Treating Aadya Kindler Primary Care Provider: [name] Provider/Extender: G Referring Provider: Donzetta Sprung in Treatment: 5 Information Obtained from: Patient Chief Complaint Left calf venous stasis ulceration. 11/13/16; the patient is here for a left calf recurrent ulceration which is painful and has been present for the last month Electronic Signature(s) Signed: 12/19/2016 7:47:19 AM By: Linton Ham MD Entered By: Linton Ham on 12/19/2016 07:33:03 Nold, [name] ([id number]) -------------------------------------------------------------------------------- HPI Details Patient Name: Amber, [name]. Date of Service: 12/18/2016 2:45 PM Medical Record Patient Account Number: [id number] Number: Treating RN: [name], RN, [name], Rita 13-Feb-1946 [phone] y.o. Other Clinician: Date of Birth/Sex: Female) Treating Enedina Pair Primary Care Provider: [name] Provider/Extender: G Referring Provider: Donzetta Sprung in Treatment: 5 History of Present Illness HPI Description: Pleasant 71 year old with history of chronic venous insufficiency. No diabetes or peripheral vascular disease. Left ABI 1.29. Questionable history of left lower extremity DVT. She developed a recurrent ulceration on her left lateral calf in December 2015, which she attributes to poor diet and subsequent lower extremity edema. She underwent endovenous laser ablation of her left greater saphenous vein in 2010. She underwent laser ablation of accessory branch of left GSV in April 2016 by Dr. Kellie Simmering at [facility]. She was previously wearing Unna boots, which  she tolerated well. Tolerating 2 layer compression and cadexomer iodine. She returns to clinic for follow-up and is without new complaints. She denies any significant pain at this time. She reports persistent pain with pressure. No claudication or ischemic rest pain. No fever or chills. No drainage. READMISSION 11/13/16; this is a 71 year old woman who is not a diabetic. She is here for a review of a painful area on her left medial lower extremity. I note that she was seen here previously last year for wound I believe to be in the same area. At that time she had undergone previously a left greater saphenous vein ablation by Dr. Kellie Simmering and she had a ablation of the anterior accessory branch of the left greater saphenous vein in March 2016. Seeing that the wound actually closed over. In reviewing the history with her today the ulcer in this area has been recurrent. She describes a biopsy of this area in 2009 that only showed stasis physiology. 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 Do not see that she has ever had arterial studies in the left leg. She tells me she has a follow-up with Dr. Kellie Simmering in roughly 10 days In any case she developed the reopening of this area roughly a month ago. On the background of this she describes rapidly increasing edema which has responded to Lasix 40 mg and metolazone 2.5 mg as well as the patient's lymph massage. She has been told she has both venous insufficiency and lymphedema but she cannot tolerate compression stockings 11/28/16; the patient saw Dr. Kellie Simmering recently. Per the patient he did arterial Dopplers in the office that did not show evidence of arterial insufficiency, per the patient he stated "treat this like  an ordinary venous ulcer".  She also saw her dermatologist Dr. Ronnald Ramp who felt that this was more of a vascular ulcer. In general things are improving although she arrives today with increasing bilateral lower extremity edema with weeping a deeper fluid through the wound on the left medial leg compatible with some degree of lymphedema 12/04/16; the patient's wound is fully epithelialized but I don't think fully healed. We will do another week of depression with Promogran and TCA however I suspect we'll be able to discharge her next week. This is a very unusual-looking wound which was initially a figure-of-eight type wound lying on its side surrounded by [name] petechial like hemorrhage. She has had venous ablation on this side. She apparently does not have an arterial issue per Dr. Kellie Simmering. She saw her dermatologist thought it was "vascular". Patient is definitely going to need ongoing compression and I talked about this with her today she will go to elastic therapy after she leaves here next week 12/11/16; the patient's wound is not completely closed today. She has surrounding scar tissue and in further discussion with the patient it would appear that she had ulcers in this area in 2009 for a prolonged period of time ultimately requiring a punch biopsy of this area that only showed venous insufficiency. I did not previously pickup on this part of the history from the patient. 12/18/16; the patient's wound is completely epithelialized. There is no open area here. She has significant bilateral venous insufficiency with secondary lymphedema to a mild-to-moderate degree she does not have compression stockings.. She did not say anything to me when I was in the room, she told our intake nurse that she was still having pain in this area. This isn't unusual recurrent small open area. She is going to go to elastic therapy to obtain compression stockings. Electronic Signature(s) Signed: 12/19/2016 7:47:19 AM By:  Linton Ham MD Entered By: Linton Ham on 12/19/2016 07:35:44 Hippert, [name] -------------------------------------------------------------------------------- Physical Exam Details Patient Name: AYLEAH, RUSCIO. Date of Service: 12/18/2016 2:45 PM Medical Record Patient Account Number: [id number] Number: Treating RN: [name], RN, [name], Rita [birth year] [phone] y.o. Other Clinician: Date of Birth/Sex: Female) Treating Dima Ferrufino Primary Care Provider: [name] Provider/Extender: G Referring Provider: Donzetta Sprung in Treatment: 5 Constitutional Sitting or standing Blood Pressure is within target range for patient.. Pulse regular and within target range for patient.Marland Kitchen Respirations regular, non-labored and within target range.. Temperature is normal and within the target range for the patient.. Patient's appearance is neat and clean. Appears in no acute distress. Well nourished and well developed.. Cardiovascular Pedal pulses palpable. Notes Wound exam; the area on her left medial leg is fully epithelialized. There is no open area here. Although the patient complained of discomfort there is no surrounding erythema no evidence of infection. Both clinically and per vascular surgery she does not have a macrovascular issue Electronic Signature(s) Signed: 12/19/2016 7:47:19 AM By: Linton Ham MD Entered By: Linton Ham on 12/19/2016 07:37:07 Jupiter, [name] -------------------------------------------------------------------------------- Physician Orders Details Patient Name: MERIL, BROCKWAY. Date of Service: 12/18/2016 2:45 PM Medical Record Patient Account Number: [id number] Number: Treating RN: Cornell Barman 01-25-1946 (70 y.o. Other Clinician: Date of Birth/Sex: Female) Treating Khaliel Morey Primary Care Provider: [name] Provider/Extender: G Referring Provider: Donzetta Sprung in  Treatment: 5 Verbal / Phone Orders: No Diagnosis Coding Wound Cleansing Wound #2RR Left,Medial Lower Leg o Cleanse wound with mild soap and water o May Shower,  gently pat wound dry prior to applying new dressing. o May shower with protection. Skin Barriers/Peri-Wound Care Wound #2RR Left,Medial Lower Leg o Triamcinolone Acetonide Ointment Dressing Change Frequency Wound #2RR Left,Medial Lower Leg o Change dressing every week Follow-up Appointments Wound #2RR Left,Medial Lower Leg o Return Appointment in 1 week. o Nurse Visit as needed Edema Control Wound #2RR Left,Medial Lower Leg o 3 Layer Compression System - Left Lower Extremity o Elevate legs to the level of the heart and pump ankles as often as possible Additional Orders / Instructions Wound #2RR Left,Medial Lower Leg o Increase protein intake. o Activity as tolerated Home Health o D/C [place] Signature(s) OTTILIA, WINAND ([id number]) Signed: 12/18/2016 4:41:51 PM By: [name] RN, [name], [name], [name] Signed: 12/19/2016 7:47:19 AM By: Linton Ham MD Entered By: [name] RN, [name], Kim on 12/18/2016 15:28:40 TEKA, WATTS ([id number]) -------------------------------------------------------------------------------- Problem List Details Patient Name: KENIDEE, SHIRRELL. Date of Service: 12/18/2016 2:45 PM Medical Record Patient Account Number: [id number] Number: Treating RN: [name], RN, [name], Rita 12/13/1945 [phone] y.o. Other Clinician: Date of Birth/Sex: Female) Treating Sanyla Summey Primary Care Provider: [name] Provider/Extender: G Referring Provider: Donzetta Sprung in Treatment: 5 Active Problems ICD-10 Encounter Code Description Active Date Diagnosis L97.221 Non-pressure chronic ulcer of left calf limited to 11/13/2016 Yes breakdown of skin I87.332 Chronic venous hypertension (idiopathic) with ulcer and 11/13/2016 Yes inflammation of left lower  extremity I89.0 Lymphedema, not elsewhere classified 11/13/2016 Yes Inactive Problems Resolved Problems Electronic Signature(s) Signed: 12/19/2016 7:47:19 AM By: Linton Ham MD Entered By: Linton Ham on 12/19/2016 07:30:06 Villeda, [name] -------------------------------------------------------------------------------- Progress Note Details Patient Name: OPHA, RAMES. Date of Service: 12/18/2016 2:45 PM Medical Record Patient Account Number: [id number] Number: Treating RN: [name], RN, [name], Rita 1946/04/14 [phone] y.o. Other Clinician: Date of Birth/Sex: Female) Treating Brit Carbonell Primary Care Provider: [name] Provider/Extender: G Referring Provider: Donzetta Sprung in Treatment: 5 Subjective Chief Complaint Information obtained from Patient Left calf venous stasis ulceration. 11/13/16; the patient is here for a left calf recurrent ulceration which is painful and has been present for the last month History of Present Illness (HPI) Pleasant 71 year old with history of chronic venous insufficiency. No diabetes or peripheral vascular disease. Left ABI 1.29. Questionable history of left lower extremity DVT. She developed a recurrent ulceration on her left lateral calf in December 2015, which she attributes to poor diet and subsequent lower extremity edema. She underwent endovenous laser ablation of her left greater saphenous vein in 2010. She underwent laser ablation of accessory branch of left GSV in April 2016 by Dr. Kellie Simmering at [facility]. She was previously wearing Unna boots, which she tolerated well. Tolerating 2 layer compression and cadexomer iodine. She returns to clinic for follow-up and is without new complaints. She denies any significant pain at this time. She reports persistent pain with pressure. No claudication or ischemic rest pain. No fever or chills. No drainage. READMISSION 11/13/16; this is a 71 year old woman who is not  a diabetic. She is here for a review of a painful area on her left medial lower extremity. I note that she was seen here previously last year for wound I believe to be in the same area. At that time she had undergone previously a left greater saphenous vein ablation by Dr. Kellie Simmering and she had a ablation of the anterior accessory branch of the left greater saphenous vein in March 2016. Seeing that the wound actually closed over. In reviewing  the history with her today the ulcer in this area has been recurrent. She describes a biopsy of this area in 2009 that only showed stasis physiology. 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 Do not see that she has ever had arterial studies in the left leg. She tells me she has a follow-up with Dr. Kellie Simmering in roughly 10 days In any case she developed the reopening of this area roughly a month ago. On the background of this she describes rapidly increasing edema which has responded to Lasix 40 mg and metolazone 2.5 mg as well as the patient's lymph massage. She has been told she has both venous insufficiency and lymphedema but she cannot tolerate compression stockings 11/28/16; the patient saw Dr. Kellie Simmering recently. Per the patient he did arterial Dopplers in the office that did not show evidence of arterial insufficiency, per the patient he stated "treat this like an ordinary venous Lie, Hedi J. ([id number]) ulcer". She also saw her dermatologist Dr. Ronnald Ramp who felt that this was more of a vascular ulcer. In general things are improving although she arrives today with increasing bilateral lower extremity edema with weeping a deeper fluid through the wound on the left medial leg compatible with some degree of lymphedema 12/04/16; the patient's  wound is fully epithelialized but I don't think fully healed. We will do another week of depression with Promogran and TCA however I suspect we'll be able to discharge her next week. This is a very unusual-looking wound which was initially a figure-of-eight type wound lying on its side surrounded by petechial like hemorrhage. She has had venous ablation on this side. She apparently does not have an arterial issue per Dr. Kellie Simmering. She saw her dermatologist thought it was "vascular". Patient is definitely going to need ongoing compression and I talked about this with her today she will go to elastic therapy after she leaves here next week 12/11/16; the patient's wound is not completely closed today. She has surrounding scar tissue and in further discussion with the patient it would appear that she had ulcers in this area in 2009 for a prolonged period of time ultimately requiring a punch biopsy of this area that only showed venous insufficiency. I did not previously pickup on this part of the history from the patient. 12/18/16; the patient's wound is completely epithelialized. There is no open area here. She has significant bilateral venous insufficiency with secondary lymphedema to a mild-to-moderate degree she does not have compression stockings.. She did not say anything to me when I was in the room, she told our intake nurse that she was still having pain in this area. This isn't unusual recurrent small open area. She is going to go to elastic therapy to obtain compression stockings. Objective Constitutional Sitting or standing Blood Pressure is within target range for patient.. Pulse regular and within target range for patient.Marland Kitchen Respirations regular, non-labored and within target range.. Temperature is normal and within the target range for the patient.. Patient's appearance is neat and clean. Appears in no acute distress. Well nourished and well developed.. Vitals Time Taken: 3:00 PM, Height:  63 in, Weight: 218 lbs, BMI: 38.6, Temperature: 97.9 F, Pulse: 64 bpm, Respiratory Rate: 16 breaths/min, Blood Pressure: 116/65 mmHg. Cardiovascular Pedal pulses palpable. General Notes: Wound  exam; the area on her left medial leg is fully epithelialized. There is no open area here. Although the patient complained of discomfort there is no surrounding erythema no evidence of infection. Both clinically and per vascular surgery she does not have a macrovascular issue Integumentary (Hair, Skin) Wound #2 status is Healed - Epithelialized. Original cause of wound was Gradually Appeared. The wound is located on the Left,Medial Lower Leg. The wound measures 0.1cm length x 0.1cm width x 0.1cm depth; 0cm^2 area and 0cm^3 volume. The wound is limited to skin breakdown. There is no tunneling or DELFINIA, BUTRUM. ([id number]) undermining noted. The wound margin is indistinct and nonvisible. There is large (67-100%) granulation within the wound bed. There is no necrotic tissue within the wound bed. The periwound skin appearance exhibited: Scarring. The periwound skin appearance did not exhibit: Callus, Crepitus, Excoriation, Induration, Rash, Dry/Scaly, Maceration, Atrophie Blanche, Cyanosis, Ecchymosis, Hemosiderin Staining, Mottled, Pallor, Rubor, Erythema. Assessment Active Problems ICD-10 L97.221 - Non-pressure chronic ulcer of left calf limited to breakdown of skin I87.332 - Chronic venous hypertension (idiopathic) with ulcer and inflammation of left lower extremity I89.0 - Lymphedema, not elsewhere classified Procedures Wound #2RR Wound #2RR is an Atypical located on the Left,Medial Lower Leg . There was a Three Layer Compression Therapy Procedure by Cornell Barman, RN. Post procedure Diagnosis Wound #2RR: Same as Pre-Procedure Plan Wound Cleansing: Wound #2RR Left,Medial Lower Leg: Cleanse wound with mild soap and water May Shower, gently pat wound dry prior to applying new dressing. May  shower with protection. Skin Barriers/Peri-Wound Care: Wound #2RR Left,Medial Lower Leg: Triamcinolone Acetonide Ointment Dressing Change Frequency: Wound #2RR Left,Medial Lower Leg: Change dressing every week Follow-up Appointments: JAYELYN, BANTHER ([id number]) Wound #2RR Left,Medial Lower Leg: Return Appointment in 1 week. Nurse Visit as needed Edema Control: Wound #2RR Left,Medial Lower Leg: 3 Layer Compression System - Left Lower Extremity Elevate legs to the level of the heart and pump ankles as often as possible Additional Orders / Instructions: Wound #2RR Left,Medial Lower Leg: Increase protein intake. Activity as tolerated Home Health: D/C Home Health Services #1 because of the recurrence in this area and the difficulties with edema probably secondary to a combination of chronic venous insufficiency and lymphedema I was unsettled about discharging the patient from clinic without stockings. We therefore wrapped the left leg to control her swelling. She will need to go to elastic therapy and [place] to obtain stockings, we should be able to discharge her next week. #2 this was a somewhat odd-appearing wound when she first came here. With almost a petechial surrounding rash surrounding a small punched out area. I did not have a completely sound feeling about the etiology of this. She saw dermatology and vascular surgery. Dr. Kellie Simmering did not feel she had a vascular issue. Dermatology thought the area was "vascular" Electronic Signature(s) Signed: 12/19/2016 7:47:19 AM By: Linton Ham MD Entered By: Linton Ham on 12/19/2016 07:39:39 Holzheimer, [name] ([id number]) -------------------------------------------------------------------------------- SuperBill Details Patient Name: DARRIANNA, BLONDER. Date of Service: 12/18/2016 Medical Record Patient Account Number: [id number] Number: Treating RN: Cornell Barman 19-Oct-1946 (70 y.o. Other Clinician: Date of  Birth/Sex: Female) Treating Annamaria Salah Primary Care Provider: [name] Provider/Extender: G Referring Provider: [name] Service Line: Outpatient Weeks in Treatment: 5 Diagnosis Coding ICD-10 Codes Code Description [phone] Non-pressure chronic ulcer of left calf limited to breakdown of skin Chronic venous hypertension (idiopathic) with ulcer and inflammation of left lower I87.332 extremity I89.0 Lymphedema, not elsewhere classified Facility Procedures CPT4:  Description Modifier Quantity Code [id number] (Facility Use Only) [phone] - [facility] RT 1 LEG Physician Procedures CPT4: Description Modifier Quantity Code [id number] - WC PHYS LEVEL 2 - EST PT 1 ICD-10 Description Diagnosis L97.221 Non-pressure chronic ulcer of left calf limited to breakdown of skin I87.332 Chronic venous hypertension (idiopathic) with ulcer and  inflammation of left lower extremity Electronic Signature(s) Signed: 12/19/2016 7:47:19 AM By: Linton Ham MD Previous Signature: 12/18/2016 4:41:51 PM Version By: [name] RN, [name], [name], [name] Entered By: Linton Ham on 12/19/2016 07:40:17

## 2016-12-21 ENCOUNTER — Encounter: Payer: Medicare Other | Admitting: Occupational Therapy

## 2016-12-24 ENCOUNTER — Encounter: Payer: Medicare Other | Admitting: Occupational Therapy

## 2016-12-25 ENCOUNTER — Encounter: Payer: Medicare Other | Admitting: Internal Medicine

## 2016-12-25 DIAGNOSIS — I890 Lymphedema, not elsewhere classified: Secondary | ICD-10-CM | POA: Diagnosis not present

## 2016-12-25 DIAGNOSIS — I10 Essential (primary) hypertension: Secondary | ICD-10-CM | POA: Diagnosis not present

## 2016-12-25 DIAGNOSIS — Z87891 Personal history of nicotine dependence: Secondary | ICD-10-CM | POA: Diagnosis not present

## 2016-12-25 DIAGNOSIS — L97229 Non-pressure chronic ulcer of left calf with unspecified severity: Secondary | ICD-10-CM | POA: Diagnosis not present

## 2016-12-25 DIAGNOSIS — L97221 Non-pressure chronic ulcer of left calf limited to breakdown of skin: Secondary | ICD-10-CM | POA: Diagnosis not present

## 2016-12-25 DIAGNOSIS — Z882 Allergy status to sulfonamides status: Secondary | ICD-10-CM | POA: Diagnosis not present

## 2016-12-25 DIAGNOSIS — I87312 Chronic venous hypertension (idiopathic) with ulcer of left lower extremity: Secondary | ICD-10-CM | POA: Diagnosis not present

## 2016-12-25 DIAGNOSIS — I87332 Chronic venous hypertension (idiopathic) with ulcer and inflammation of left lower extremity: Secondary | ICD-10-CM | POA: Diagnosis not present

## 2016-12-26 ENCOUNTER — Encounter: Payer: Medicare Other | Admitting: Occupational Therapy

## 2016-12-26 NOTE — Progress Notes (Signed)
KATELAND, MOWERY ([id number]) Visit Report for 12/25/2016 Arrival Information Details Patient Name: [name], [name]. Date of Service: 12/25/2016 12:45 PM Medical Record Number: [id number] Patient Account Number: [id number] Date of Birth/Sex: August 20, 1946 (71 y.o. Female) Treating RN: Afful, RN, [name], Velva Harman Primary Care Zelig Gacek: [name] Other Clinician: Referring Becker [name]: [name] Treating Celinda Dethlefs/Extender: Tito Dine in Treatment: 6 Visit Information History Since Last Visit All ordered tests and consults were completed: No Patient Arrived: Ambulatory Added or deleted any medications: No Arrival Time: 12:47 Any new allergies or adverse reactions: No Accompanied By: self Had a fall or experienced change in No Transfer Assistance: None activities of daily living that may affect Patient Identification Verified: Yes risk of falls: Secondary Verification Process Yes Signs or symptoms of abuse/neglect since last No Completed: visito Patient Requires Transmission-Based No Hospitalized since last visit: No Precautions: Has Dressing in Place as Prescribed: Yes Patient Has Alerts: No Pain Present Now: Yes Electronic Signature(s) Signed: 12/25/2016 4:45:11 PM By: Regan Lemming [name], RN Entered By: Regan Lemming on 12/25/2016 12:51:35 [name] -------------------------------------------------------------------------------- Clinic Level of Care Assessment Details Patient Name: Amber, [name]. Date of Service: 12/25/2016 12:45 PM Medical Record Number: [id number] Patient Account Number: [id number] Date of Birth/Sex: [birth year] (71 y.o. Female) Treating RN: Afful, RN, [name], Velva Harman Primary Care Jazmaine Fuelling: [name] Other Clinician: Referring Dmani Mizer: [name] Treating Mikolaj Woolstenhulme/Extender: Tito Dine in Treatment: 6 Clinic Level of Care Assessment Items TOOL 4 Quantity Score []  - Use when only an EandM is performed on  FOLLOW-UP visit 0 ASSESSMENTS - Nursing Assessment / Reassessment X - Reassessment of Co-morbidities (includes updates in patient status) 1 10 X - Reassessment of Adherence to Treatment Plan 1 5 ASSESSMENTS - Wound and Skin Assessment / Reassessment []  - Simple Wound Assessment / Reassessment - one wound 0 []  - Complex Wound Assessment / Reassessment - multiple wounds 0 []  - Dermatologic / Skin Assessment (not related to wound area) 0 ASSESSMENTS - Focused Assessment []  - Circumferential Edema Measurements - multi extremities 0 []  - Nutritional Assessment / Counseling / Intervention 0 X - Lower Extremity Assessment (monofilament, tuning fork, pulses) 1 5 []  - Peripheral Arterial Disease Assessment (using hand held doppler) 0 ASSESSMENTS - Ostomy and/or Continence Assessment and Care []  - Incontinence Assessment and Management 0 []  - Ostomy Care Assessment and Management (repouching, etc.) 0 PROCESS - Coordination of Care X - Simple Patient / Family Education for ongoing care 1 15 []  - Complex (extensive) Patient / Family Education for ongoing care 0 []  - Staff obtains Programmer, systems, Records, Test Results / Process Orders 0 []  - Staff telephones HHA, Nursing Homes / Clarify orders / etc 0 []  - Routine Transfer to another Facility (non-emergent condition) 0 BRAYDEE, DOSCH ([id number]) []  - Routine Hospital Admission (non-emergent condition) 0 []  - New Admissions / Biomedical engineer / Ordering NPWT, Apligraf, etc. 0 []  - Emergency Hospital Admission (emergent condition) 0 []  - Simple Discharge Coordination 0 []  - Complex (extensive) Discharge Coordination 0 PROCESS - Special Needs []  - Pediatric / Minor Patient Management 0 []  - Isolation Patient Management 0 []  - Hearing / Language / Visual special needs 0 []  - Assessment of Community assistance (transportation, D/C planning, etc.) 0 []  - Additional assistance / Altered mentation 0 []  - Support Surface(s) Assessment (bed, cushion,  seat, etc.) 0 INTERVENTIONS - Wound Cleansing / Measurement []  - Simple Wound Cleansing - one wound 0 []  - Complex Wound Cleansing - multiple wounds 0 X -  Wound Imaging (photographs - any number of wounds) 1 5 []  - Wound Tracing (instead of photographs) 0 []  - Simple Wound Measurement - one wound 0 []  - Complex Wound Measurement - multiple wounds 0 INTERVENTIONS - Wound Dressings []  - Small Wound Dressing one or multiple wounds 0 []  - Medium Wound Dressing one or multiple wounds 0 []  - Large Wound Dressing one or multiple wounds 0 []  - Application of Medications - topical 0 []  - Application of Medications - injection 0 INTERVENTIONS - Miscellaneous []  - External ear exam 0 Hlavaty, Tanner J. ([id number]) []  - Specimen Collection (cultures, biopsies, blood, body fluids, etc.) 0 []  - Specimen(s) / Culture(s) sent or taken to Lab for analysis 0 []  - Patient Transfer (multiple staff / Harrel Lemon Lift / Similar devices) 0 []  - Simple Staple / Suture removal (25 or less) 0 []  - Complex Staple / Suture removal (26 or more) 0 []  - Hypo / Hyperglycemic Management (close monitor of Blood Glucose) 0 []  - Ankle / Brachial Index (ABI) - do not check if billed separately 0 X - Vital Signs 1 5 Has the patient been seen at the hospital within the last three years: Yes Total Score: 45 Level Of Care: New/Established - Level 2 Electronic Signature(s) Signed: 12/25/2016 4:45:11 PM By: Regan Lemming [name], RN Entered By: Regan Lemming on 12/25/2016 13:13:48 Smiles, [name] ([id number]) -------------------------------------------------------------------------------- Encounter Discharge Information Details Patient Name: [name], Amber. Date of Service: 12/25/2016 12:45 PM Medical Record Number: [id number] Patient Account Number: [id number] Date of Birth/Sex: Feb 12, 1946 (71 y.o. Female) Treating RN: Afful, RN, [name], Velva Harman Primary Care Laurel Smeltz: [name] Other Clinician: Referring Steele Ledonne: [name] Treating Jennell Janosik/Extender: Tito Dine in Treatment: 6 Encounter Discharge Information Items Discharge Pain Level: 0 Discharge Condition: Stable Ambulatory Status: Ambulatory Discharge Destination: Home Transportation: Private Auto Accompanied By: self Schedule Follow-up Appointment: No Medication Reconciliation completed and provided to Patient/Care No Erhard Senske: Provided on Clinical Summary of Care: 12/25/2016 Form Type Recipient Paper Patient LP Electronic Signature(s) Signed: 12/25/2016 1:19:37 PM By: Ruthine Dose Entered By: Ruthine Dose on 12/25/2016 13:19:37 Geno, [name] ([id number]) -------------------------------------------------------------------------------- Lower Extremity Assessment Details Patient Name: CHIVONNE, HEIDE. Date of Service: 12/25/2016 12:45 PM Medical Record Number: [id number] Patient Account Number: [id number] Date of Birth/Sex: 04-15-1946 (71 y.o. Female) Treating RN: Afful, RN, [name], Velva Harman Primary Care Dmauri Rosenow: [name] Other Clinician: Referring Akai Dollard: [name] Treating Teron Blais/Extender: Tito Dine in Treatment: 6 Edema Assessment Assessed: [Left: No] [Right: No] Edema: [Left: Ye] [Right: s] Calf Left: Right: Point of Measurement: cm From Medial Instep 40.3 cm cm Ankle Left: Right: Point of Measurement: cm From Medial Instep 24.8 cm cm Vascular Assessment Claudication: Claudication Assessment [Left:None] Pulses: Dorsalis Pedis Palpable: [Left:Yes] Posterior Tibial Extremity colors, hair growth, and conditions: Extremity Color: [Left:Mottled] Hair Growth on Extremity: [Left:Yes] Temperature of Extremity: [Left:Warm] Capillary Refill: [Left:< 3 seconds] Toe Nail Assessment Left: Right: Thick: Yes Discolored: Yes Deformed: No Improper Length and Hygiene: No Electronic Signature(s) Signed: 12/25/2016 4:45:11 PM By: Regan Lemming [name], RN Entered By: Regan Lemming on 12/25/2016  13:10:48 MELIKA, MONTORO ([id number]) Gehrig, [name] ([id number]) -------------------------------------------------------------------------------- Multi Wound Chart Details Patient Name: LEONARD, PRESIDENT. Date of Service: 12/25/2016 12:45 PM Medical Record Number: [id number] Patient Account Number: [id number] Date of Birth/Sex: [birth year] (71 y.o. Female) Treating RN: [name], RN, [name], Velva Harman Primary Care Zakariyah Freimark: [name] Other Clinician: Referring Ileana Chalupa: [name] Treating Jamahl Lemmons/Extender: Tito Dine in Treatment: 6 Vital Signs Height(in): 63 Pulse(bpm):  75 Weight(lbs): 218 Blood Pressure 106/64 (mmHg): Body Mass Index(BMI): 39 Temperature(F): 98.1 Respiratory Rate 16 (breaths/min): Photos: [2RR:No Photos] [N/A:N/A] Wound Location: [2RR:Left Lower Leg - Medial] [N/A:N/A] Wounding Event: [2RR:Gradually Appeared] [N/A:N/A] Primary Etiology: [2RR:Atypical] [N/A:N/A] Comorbid History: [2RR:Cataracts, Asthma, Sleep Apnea, Deep Vein Thrombosis, Hypertension, Peripheral Venous Disease, Osteoarthritis, Received Chemotherapy, Received Radiation] [N/A:N/A] Date Acquired: [2RR:10/10/2016] [N/A:N/A] Weeks of Treatment: [2RR:6] [N/A:N/A] Wound Status: [2RR:Open] [N/A:N/A] Wound Recurrence: [2RR:Yes] [N/A:N/A] Measurements L x W x D 0x0x0 [N/A:N/A] (cm) Area (cm) : [2RR:0] [N/A:N/A] Volume (cm) : [2RR:0] [N/A:N/A] % Reduction in Area: [2RR:100.00%] [N/A:N/A] % Reduction in Volume: 100.00% [N/A:N/A] Classification: [2RR:Partial Thickness] [N/A:N/A] Exudate Amount: [2RR:None Present] [N/A:N/A] Wound Margin: [2RR:Indistinct, nonvisible] [N/A:N/A] Granulation Amount: [2RR:Large (67-100%)] [N/A:N/A] Granulation Quality: [2RR:Friable] [N/A:N/A] Necrotic Amount: [2RR:None Present (0%)] [N/A:N/A] Exposed Structures: [2RR:Fascia: No Fat Layer (Subcutaneous] [N/A:N/A] Tissue) Exposed: No Tendon: No Muscle: No Joint: No Bone: No Limited to  Skin Breakdown Epithelialization: Large (67-100%) N/A N/A Periwound Skin Texture: Scarring: Yes N/A N/A Excoriation: No Induration: No Callus: No Crepitus: No Rash: No Periwound Skin Maceration: No N/A N/A Moisture: Dry/Scaly: No Periwound Skin Color: Atrophie Blanche: No N/A N/A Cyanosis: No Ecchymosis: No Erythema: No Hemosiderin Staining: No Mottled: No Pallor: No Rubor: No Temperature: No Abnormality N/A N/A Tenderness on Yes N/A N/A Palpation: Wound Preparation: Ulcer Cleansing: N/A N/A Rinsed/Irrigated with Saline Topical Anesthetic Applied: None Treatment Notes Electronic Signature(s) Signed: 12/26/2016 9:58:44 AM By: Linton Ham MD Entered By: Linton Ham on 12/25/2016 13:42:39 Alia, [name] -------------------------------------------------------------------------------- Multi-Disciplinary Care Plan Details Patient Name: LOUCINDA, WORKU. Date of Service: 12/25/2016 12:45 PM Medical Record Number: [id number] Patient Account Number: [id number] Date of Birth/Sex: 02/13/1946 (71 y.o. Female) Treating RN: Afful, RN, [name], Velva Harman Primary Care Dyllan [name]: [name] Other Clinician: Referring Youa Deloney: [name] Treating Elbia Paro/Extender: Tito Dine in Treatment: 6 Active Inactive Electronic Signature(s) Signed: 12/25/2016 4:45:11 PM By: Regan Lemming [name], RN Entered By: Regan Lemming on 12/25/2016 13:12:09 [name] -------------------------------------------------------------------------------- Pain Assessment Details Patient Name: MADLIN, RENS. Date of Service: 12/25/2016 12:45 PM Medical Record Number: [id number] Patient Account Number: [id number] Date of Birth/Sex: 10/04/1946 (71 y.o. Female) Treating RN: Afful, RN, [name], Velva Harman Primary Care Evely Gainey: [name] Other Clinician: Referring Errik Mitchelle: [name] Treating Marvin Grabill/Extender: Tito Dine in Treatment: 6 Active  Problems Location of Pain Severity and Description of Pain Patient Has Paino Yes Site Locations Pain Location: Pain in Ulcers Rate the pain. Current Pain Level: 4 Character of Pain Describe the Pain: Tender Pain Management and Medication Current Pain Management: Electronic Signature(s) Signed: 12/25/2016 4:45:11 PM By: Regan Lemming [name], RN Entered By: Regan Lemming on 12/25/2016 12:51:47 [name] -------------------------------------------------------------------------------- Patient/Caregiver Education Details Patient Name: MADDALENA, HORNG. Date of Service: 12/25/2016 12:45 PM Medical Record Patient Account Number: [id number] Number: Treating RN: [name], RN, [name], Rita May 02, 1946 [phone] y.o. Other Clinician: Date of Birth/Gender: Female) Treating ROBSON, MICHAEL Primary Care Physician: [name] Physician/Extender: G Referring Physician: Donzetta Sprung in Treatment: 6 Education Assessment Education Provided To: Patient Education Topics Provided Electronic Signature(s) Signed: 12/25/2016 4:45:11 PM By: Regan Lemming [name], RN Entered By: Regan Lemming on 12/25/2016 13:19:20 [name] -------------------------------------------------------------------------------- Wound Assessment Details Patient Name: KINGSLEIGH, LOZOYA. Date of Service: 12/25/2016 12:45 PM Medical Record Number: [id number] Patient Account Number: [id number] Date of Birth/Sex: May 12, 1946 (71 y.o. Female) Treating RN: Afful, RN, [name], [employer] Primary Care Aurore Redinger: [name] Other Clinician: Referring Susumu Hackler: [name] Treating Zayvien Canning/Extender: [name] Dillon  Weeks in Treatment: 6 Wound Status Wound Number: 2RR Primary Atypical Etiology: Wound Location: Left Lower Leg - Medial Wound Open Wounding Event: Gradually Appeared Status: Date Acquired: 10/10/2016 Comorbid Cataracts, Asthma, Sleep Apnea, Deep Weeks Of Treatment: 6 History:  Vein Thrombosis, Hypertension, Clustered Wound: No Peripheral Venous Disease, Osteoarthritis, Received Chemotherapy, Received Radiation Photos Photo Uploaded By: Regan Lemming on 12/25/2016 17:01:41 Wound Measurements Length: (cm) 0 % Reduction i Width: (cm) 0 % Reduction i Depth: (cm) 0 Epithelializa Area: (cm) 0 Tunneling: Volume: (cm) 0 Undermining: n Area: 100% n Volume: 100% tion: Large (67-100%) No No Wound Description Classification: Partial Thickness Wound Margin: Indistinct, nonvisible Exudate Amount: None Present Foul Odor After Cleansing: No Slough/Fibrino No Wound Bed Granulation Amount: Large (67-100%) Exposed Structure Granulation Quality: Friable Fascia Exposed: No Necrotic Amount: None Present (0%) Fat Layer (Subcutaneous Tissue) Exposed: No Tendon Exposed: No Platner, Alivia J. ([id number]) Muscle Exposed: No Joint Exposed: No Bone Exposed: No Limited to Skin Breakdown Periwound Skin Texture Texture Color No Abnormalities Noted: No No Abnormalities Noted: No Callus: No Atrophie Blanche: No Crepitus: No Cyanosis: No Excoriation: No Ecchymosis: No Induration: No Erythema: No Rash: No Hemosiderin Staining: No Scarring: Yes Mottled: No Pallor: No Moisture Rubor: No No Abnormalities Noted: No Dry / Scaly: No Temperature / Pain Maceration: No Temperature: No Abnormality Tenderness on Palpation: Yes Wound Preparation Ulcer Cleansing: Rinsed/Irrigated with Saline Topical Anesthetic Applied: None Electronic Signature(s) Signed: 12/25/2016 4:45:11 PM By: Regan Lemming [name], RN Entered By: Regan Lemming on 12/25/2016 12:53:47 Mash, [name] -------------------------------------------------------------------------------- Vitals Details Patient Name: WILNA, STGELAIS. Date of Service: 12/25/2016 12:45 PM Medical Record Number: [id number] Patient Account Number: [id number] Date of Birth/Sex: 22-Feb-1946 (71 y.o. Female) Treating RN:  Afful, RN, [name], Velva Harman Primary Care Lelar Farewell: [name] Other Clinician: Referring Anthea Udovich: [name] Treating Khyra Viscuso/Extender: Tito Dine in Treatment: 6 Vital Signs Time Taken: 12:52 Temperature (F): 98.1 Height (in): 63 Pulse (bpm): 75 Weight (lbs): 218 Respiratory Rate (breaths/min): 16 Body Mass Index (BMI): 38.6 Blood Pressure (mmHg): 106/64 Reference Range: 80 - 120 mg / dl Electronic Signature(s) Signed: 12/25/2016 4:45:11 PM By: Regan Lemming [name], RN Entered By: Regan Lemming on 12/25/2016 12:52:35

## 2016-12-26 NOTE — Progress Notes (Signed)
LIBERTA, DELAPLANE ([id number]) Visit Report for 12/25/2016 Chief Complaint Document Details Patient Name: [name], [name]. Date of Service: 12/25/2016 12:45 PM Medical Record Patient Account Number: [id number] Number: Treating RN: [name], RN, [name], Rita 1946/02/15 [phone] y.o. Other Clinician: Date of Birth/Sex: Female) Treating ROBSON, MICHAEL Primary Care Provider: [name] Provider/Extender: G Referring Provider: Donzetta Sprung in Treatment: 6 Information Obtained from: Patient Chief Complaint Left calf venous stasis ulceration. 11/13/16; the patient is here for a left calf recurrent ulceration which is painful and has been present for the last month Electronic Signature(s) Signed: 12/26/2016 9:58:44 AM By: Linton Ham MD Entered By: Linton Ham on 12/25/2016 13:43:00 Newbern, [name] -------------------------------------------------------------------------------- HPI Details Patient Name: Amber, [name]. Date of Service: 12/25/2016 12:45 PM Medical Record Patient Account Number: [id number] Number: Treating RN: [name], RN, [name], Rita 09/21/1946 [phone] y.o. Other Clinician: Date of Birth/Sex: Female) Treating ROBSON, MICHAEL Primary Care Provider: [name] Provider/Extender: G Referring Provider: Donzetta Sprung in Treatment: 6 History of Present Illness HPI Description: Pleasant 71 year old with history of chronic venous insufficiency. No diabetes or peripheral vascular disease. Left ABI 1.29. Questionable history of left lower extremity DVT. She developed a recurrent ulceration on her left lateral calf in December 2015, which she attributes to poor diet and subsequent lower extremity edema. She underwent endovenous laser ablation of her left greater saphenous vein in 2010. She underwent laser ablation of accessory branch of left GSV in April 2016 by Dr. Kellie Simmering at [facility] She was previously wearing Unna boots,  which she tolerated well. Tolerating 2 layer compression and cadexomer iodine. She returns to clinic for follow-up and is without new complaints. She denies any significant pain at this time. She reports persistent pain with pressure. No claudication or ischemic rest pain. No fever or chills. No drainage. READMISSION 11/13/16; this is a 71 year old woman who is not a diabetic. She is here for a review of a painful area on her left medial lower extremity. I note that she was seen here previously last year for wound I believe to be in the same area. At that time she had undergone previously a left greater saphenous vein ablation by Dr. Kellie Simmering and she had a ablation of the anterior accessory branch of the left greater saphenous vein in March 2016. Seeing that the wound actually closed over. In reviewing the history with her today the ulcer in this area has been recurrent. She describes a biopsy of this area in 2009 that only showed stasis physiology. 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 Do not see that she has ever had arterial studies in the left leg. She tells me she has a follow-up with Dr. Kellie Simmering in roughly 10 days In any case she developed the reopening of this area roughly a month ago. On the background of this she describes rapidly increasing edema which has responded to Lasix 40 mg and metolazone 2.5 mg as well as the patient's lymph massage. She has been told she has both venous insufficiency and lymphedema but she cannot tolerate compression stockings 11/28/16; the patient saw Dr. Kellie Simmering recently. Per the patient he did arterial Dopplers in the office that did not show evidence of arterial insufficiency, per the patient he stated "treat this like  an ordinary  venous ulcer". She also saw her dermatologist Dr. Ronnald Ramp who felt that this was more of a vascular ulcer. In general things are improving although she arrives today with increasing bilateral lower extremity edema with weeping a deeper fluid through the wound on the left medial leg compatible with some degree of lymphedema 12/04/16; the patient's wound is fully epithelialized but I don't think fully healed. We will do another week of depression with Promogran and TCA however I suspect we'll be able to discharge her next week. This is a very unusual-looking wound which was initially a figure-of-eight type wound lying on its side surrounded by [name] petechial like hemorrhage. She has had venous ablation on this side. She apparently does not have an arterial issue per Dr. Kellie Simmering. She saw her dermatologist thought it was "vascular". Patient is definitely going to need ongoing compression and I talked about this with her today she will go to elastic therapy after she leaves here next week 12/11/16; the patient's wound is not completely closed today. She has surrounding scar tissue and in further discussion with the patient it would appear that she had ulcers in this area in 2009 for a prolonged period of time ultimately requiring a punch biopsy of this area that only showed venous insufficiency. I did not previously pickup on this part of the history from the patient. 12/18/16; the patient's wound is completely epithelialized. There is no open area here. She has significant bilateral venous insufficiency with secondary lymphedema to a mild-to-moderate degree she does not have compression stockings.. She did not say anything to me when I was in the room, she told our intake nurse that she was still having pain in this area. This isn't unusual recurrent small open area. She is going to go to elastic therapy to obtain compression stockings. 12/25/16; the patient's wound is fully  epithelialized. There is no open area here. The patient describes some continued episodic discomfort in this area medial left calf. However everything looks fine and healed here. She is been to elastic therapy and caught herself 15-20 mmHg stockings, they apparently were having trouble getting 20-30 mm stockings in her size Electronic Signature(s) Signed: 12/26/2016 9:58:44 AM By: Linton Ham MD Entered By: Linton Ham on 12/25/2016 13:44:23 Kuenzel, [name] -------------------------------------------------------------------------------- Physical Exam Details Patient Name: SKYI, GRAVELL. Date of Service: 12/25/2016 12:45 PM Medical Record Patient Account Number: [id number] Number: Treating RN: [name], RN, [name], Rita 25-Mar-1946 [phone] y.o. Other Clinician: Date of Birth/Sex: Female) Treating ROBSON, MICHAEL Primary Care Provider: [name] Provider/Extender: G Referring Provider: Donzetta Sprung in Treatment: 6 Constitutional Sitting or standing Blood Pressure is within target range for patient.. Pulse regular and within target range for patient.. Temperature is normal and within the target range for the patient.. Cardiovascular Pedal pulses palpable on the left leg. Notes Wound exam; the area on the left medial leg is fully epithelialized. There is no open area here. Her edema is reasonably well controlled. There is no surrounding erythema. Electronic Signature(s) Signed: 12/26/2016 9:58:44 AM By: Linton Ham MD Entered By: Linton Ham on 12/25/2016 13:45:14 Schepp, [name] -------------------------------------------------------------------------------- Physician Orders Details Patient Name: DANITRA, SWAREY. Date of Service: 12/25/2016 12:45 PM Medical Record Patient Account Number: [id number] Number: Treating RN: [name], RN, [name], Rita 10/04/1946 [phone] y.o. Other Clinician: Date of Birth/Sex: Female) Treating  ROBSON, MICHAEL Primary Care Provider: [name] Provider/Extender: G Referring Provider: Donzetta Sprung in Treatment: 6 Verbal /  Phone Orders: No Diagnosis Coding Edema Control o Patient to wear own compression stockings Discharge From [facility] o Discharge from [facility] Completed Electronic Signature(s) Signed: 12/25/2016 4:45:11 PM By: Regan Lemming [name], RN Signed: 12/26/2016 9:58:44 AM By: Linton Ham MD Entered By: Regan Lemming on 12/25/2016 13:13:19 RASHA, TUTT ([id number]) -------------------------------------------------------------------------------- Problem List Details Patient Name: ROBIN, BUTTS. Date of Service: 12/25/2016 12:45 PM Medical Record Patient Account Number: [id number] Number: Treating RN: [name], RN, [name], Rita [birth year] [phone] y.o. Other Clinician: Date of Birth/Sex: Female) Treating ROBSON, MICHAEL Primary Care Provider: [name] Provider/Extender: G Referring Provider: Donzetta Sprung in Treatment: 6 Active Problems ICD-10 Encounter Code Description Active Date Diagnosis L97.221 Non-pressure chronic ulcer of left calf limited to 11/13/2016 Yes breakdown of skin I87.332 Chronic venous hypertension (idiopathic) with ulcer and 11/13/2016 Yes inflammation of left lower extremity I89.0 Lymphedema, not elsewhere classified 11/13/2016 Yes Inactive Problems Resolved Problems Electronic Signature(s) Signed: 12/26/2016 9:58:44 AM By: Linton Ham MD Entered By: Linton Ham on 12/25/2016 13:42:30 Mumpower, [name] -------------------------------------------------------------------------------- Progress Note Details Patient Name: CRETA, BARTCH. Date of Service: 12/25/2016 12:45 PM Medical Record Patient Account Number: [id number] Number: Treating RN: [name], RN, [name], Rita 28-Oct-1946 [phone] y.o. Other Clinician: Date of Birth/Sex: Female) Treating ROBSON,  MICHAEL Primary Care Provider: [name] Provider/Extender: G Referring Provider: Donzetta Sprung in Treatment: 6 Subjective Chief Complaint Information obtained from Patient Left calf venous stasis ulceration. 11/13/16; the patient is here for a left calf recurrent ulceration which is painful and has been present for the last month History of Present Illness (HPI) Pleasant 71 year old with history of chronic venous insufficiency. No diabetes or peripheral vascular disease. Left ABI 1.29. Questionable history of left lower extremity DVT. She developed a recurrent ulceration on her left lateral calf in December 2015, which she attributes to poor diet and subsequent lower extremity edema. She underwent endovenous laser ablation of her left greater saphenous vein in 2010. She underwent laser ablation of accessory branch of left GSV in April 2016 by Dr. Kellie Simmering at [facility]. She was previously wearing Unna boots, which she tolerated well. Tolerating 2 layer compression and cadexomer iodine. She returns to clinic for follow-up and is without new complaints. She denies any significant pain at this time. She reports persistent pain with pressure. No claudication or ischemic rest pain. No fever or chills. No drainage. READMISSION 11/13/16; this is a 71 year old woman who is not a diabetic. She is here for a review of a painful area on her left medial lower extremity. I note that she was seen here previously last year for wound I believe to be in the same area. At that time she had undergone previously a left greater saphenous vein ablation by Dr. Kellie Simmering and she had a ablation of the anterior accessory branch of the left greater saphenous vein in March 2016. Seeing that the wound actually closed over. In reviewing the history with her today the ulcer in this area has been recurrent. She describes a biopsy of this area in 2009 that only showed stasis physiology. She also has a history  of today malignant melanoma in the right shoulder for which she follows with Dr. 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 Do not see that she has ever had arterial studies in the left leg. She tells me she has a  follow-up with Dr. Kellie Simmering in roughly 10 days In any case she developed the reopening of this area roughly a month ago. On the background of this she describes rapidly increasing edema which has responded to Lasix 40 mg and metolazone 2.5 mg as well as the patient's lymph massage. She has been told she has both venous insufficiency and lymphedema but she cannot tolerate compression stockings 11/28/16; the patient saw Dr. Kellie Simmering recently. Per the patient he did arterial Dopplers in the office that did not show evidence of arterial insufficiency, per the patient he stated "treat this like an ordinary venous Wimbish, Rayneisha J. ([id number]) ulcer". She also saw her dermatologist Dr. Ronnald Ramp who felt that this was more of a vascular ulcer. In general things are improving although she arrives today with increasing bilateral lower extremity edema with weeping a deeper fluid through the wound on the left medial leg compatible with some degree of lymphedema 12/04/16; the patient's wound is fully epithelialized but I don't think fully healed. We will do another week of depression with Promogran and TCA however I suspect we'll be able to discharge her next week. This is a very unusual-looking wound which was initially a figure-of-eight type wound lying on its side surrounded by petechial like hemorrhage. She has had venous ablation on this side. She apparently does not have an arterial issue per Dr. Kellie Simmering. She saw her dermatologist thought it was "vascular". Patient is definitely going to need ongoing compression and I talked about this with her today she will go to elastic therapy  after she leaves here next week 12/11/16; the patient's wound is not completely closed today. She has surrounding scar tissue and in further discussion with the patient it would appear that she had ulcers in this area in 2009 for a prolonged period of time ultimately requiring a punch biopsy of this area that only showed venous insufficiency. I did not previously pickup on this part of the history from the patient. 12/18/16; the patient's wound is completely epithelialized. There is no open area here. She has significant bilateral venous insufficiency with secondary lymphedema to a mild-to-moderate degree she does not have compression stockings.. She did not say anything to me when I was in the room, she told our intake nurse that she was still having pain in this area. This isn't unusual recurrent small open area. She is going to go to elastic therapy to obtain compression stockings. 12/25/16; the patient's wound is fully epithelialized. There is no open area here. The patient describes some continued episodic discomfort in this area medial left calf. However everything looks fine and healed here. She is been to elastic therapy and caught herself 15-20 mmHg stockings, they apparently were having trouble getting 20-30 mm stockings in her size Objective Constitutional Sitting or standing Blood Pressure is within target range for patient.. Pulse regular and within target range for patient.. Temperature is normal and within the target range for the patient.. Vitals Time Taken: 12:52 PM, Height: 63 in, Weight: 218 lbs, BMI: 38.6, Temperature: 98.1 F, Pulse: 75 bpm, Respiratory Rate: 16 breaths/min, Blood Pressure: 106/64 mmHg. Cardiovascular Pedal pulses palpable on the left leg. General Notes: Wound exam; the area on the left medial leg is fully epithelialized. There is no open area here. Her edema is reasonably well controlled. There is no surrounding erythema. Integumentary (Hair, Skin) Wound  #2RR status is Open. Original cause of wound was Gradually Appeared. The wound is located on the Left,Medial Lower Leg. The wound measures 0cm  length x 0cm width x 0cm depth; 0cm^2 area and Orchard, Dejanay J. ([id number]) 0cm^3 volume. The wound is limited to skin breakdown. There is no tunneling or undermining noted. There is a none present amount of drainage noted. The wound margin is indistinct and nonvisible. There is large (67-100%) friable granulation within the wound bed. There is no necrotic tissue within the wound bed. The periwound skin appearance exhibited: Scarring. The periwound skin appearance did not exhibit: Callus, Crepitus, Excoriation, Induration, Rash, Dry/Scaly, Maceration, Atrophie Blanche, Cyanosis, Ecchymosis, Hemosiderin Staining, Mottled, Pallor, Rubor, Erythema. Periwound temperature was noted as No Abnormality. The periwound has tenderness on palpation. Assessment Active Problems ICD-10 L97.221 - Non-pressure chronic ulcer of left calf limited to breakdown of skin I87.332 - Chronic venous hypertension (idiopathic) with ulcer and inflammation of left lower extremity I89.0 - Lymphedema, not elsewhere classified Plan Edema Control: Patient to wear own compression stockings Discharge From [facility] Services: Discharge from [facility] Completed patient can be discharged to her own stockings I am concerned that the stockings she has obtained will not have sufficient compression she is to obtain 20-30mmHg stockings when available This was an odd area, I wouldn't be surprised if it reoccurs Electronic Signature(s) Signed: 12/26/2016 9:58:44 AM By: Linton Ham MD KIZ, SANTAANA ([id number]) Entered By: Linton Ham on 12/25/2016 13:47:19 Droke, [name] -------------------------------------------------------------------------------- SuperBill Details Patient Name: SHATOYA, FINK. Date of Service: 12/25/2016 Medical Record Patient  Account Number: [id number] Number: Treating RN: [name], RN, [name], Rita [birth year] [phone] y.o. Other Clinician: Date of Birth/Sex: Female) Treating ROBSON, MICHAEL Primary Care Provider: [name] Provider/Extender: G Referring Provider: [name] Service Line: Outpatient Weeks in Treatment: 6 Diagnosis Coding ICD-10 Codes Code Description [phone] Non-pressure chronic ulcer of left calf limited to breakdown of skin Chronic venous hypertension (idiopathic) with ulcer and inflammation of left lower I87.332 extremity I89.0 Lymphedema, not elsewhere classified Facility Procedures CPT4 Code: [id number] Description: [id number] - WOUND CARE VISIT-LEV 2 EST PT Modifier: Quantity: 1 Physician Procedures CPT4 Code Description: [id number] - WC PHYS LEVEL 2 - EST PT ICD-10 Description Diagnosis L97.221 Non-pressure chronic ulcer of left calf limited to Modifier: breakdown of ski Quantity: 1 n Electronic Signature(s) Signed: 12/26/2016 9:58:44 AM By: Linton Ham MD Entered By: Linton Ham on 12/25/2016 13:47:48

## 2016-12-28 ENCOUNTER — Encounter: Payer: Medicare Other | Admitting: Occupational Therapy

## 2016-12-28 ENCOUNTER — Encounter: Payer: Self-pay | Admitting: Family Medicine

## 2016-12-28 ENCOUNTER — Ambulatory Visit (INDEPENDENT_AMBULATORY_CARE_PROVIDER_SITE_OTHER): Payer: Medicare Other | Admitting: Family Medicine

## 2016-12-28 DIAGNOSIS — M40203 Unspecified kyphosis, cervicothoracic region: Secondary | ICD-10-CM | POA: Diagnosis not present

## 2016-12-28 DIAGNOSIS — M419 Scoliosis, unspecified: Secondary | ICD-10-CM | POA: Diagnosis not present

## 2016-12-28 DIAGNOSIS — J45901 Unspecified asthma with (acute) exacerbation: Secondary | ICD-10-CM | POA: Insufficient documentation

## 2016-12-28 DIAGNOSIS — I10 Essential (primary) hypertension: Secondary | ICD-10-CM | POA: Diagnosis not present

## 2016-12-28 DIAGNOSIS — J4541 Moderate persistent asthma with (acute) exacerbation: Secondary | ICD-10-CM

## 2016-12-28 DIAGNOSIS — I872 Venous insufficiency (chronic) (peripheral): Secondary | ICD-10-CM | POA: Diagnosis not present

## 2016-12-28 DIAGNOSIS — I890 Lymphedema, not elsewhere classified: Secondary | ICD-10-CM | POA: Diagnosis not present

## 2016-12-28 DIAGNOSIS — M4713 Other spondylosis with myelopathy, cervicothoracic region: Secondary | ICD-10-CM | POA: Diagnosis not present

## 2016-12-28 MED ORDER — PREDNISONE 20 MG PO TABS: ORAL_TABLET | ORAL | 0 refills | Status: DC

## 2016-12-28 NOTE — Progress Notes (Signed)
   Subjective:    Patient ID: [name], female    DOB: 1946/08/29, 71 y.o.   [name]  Cough  This is a new problem. The current episode started in the past 7 days. The problem has been gradually worsening. The cough is productive of sputum ( clear mucus). Associated symptoms include nasal congestion, postnasal drip, a sore throat and wheezing. Pertinent negatives include no chills, ear congestion, ear pain, fever, headaches, myalgias or shortness of breath. Associated symptoms comments: sneezing. The symptoms are aggravated by lying down. Risk factors: former smoker. She has tried a beta-agonist inhaler (zyrtec, nasocort, mucinex, singulair, using inhaler 3 times a day) for the symptoms. Her past medical history is significant for asthma.  Wheezing   Associated symptoms include coughing and a sore throat. Pertinent negatives include no chills, ear pain, fever, headaches or shortness of breath. Her past medical history is significant for asthma.    Social History /Family History/Past Medical History reviewed and updated if needed.   Review of Systems  Constitutional: Negative for chills and fever.  HENT: Positive for postnasal drip and sore throat. Negative for ear pain.   Respiratory: Positive for cough and wheezing. Negative for shortness of breath.   Musculoskeletal: Negative for myalgias.  Neurological: Negative for headaches.       Objective:   Physical Exam  Constitutional: Vital signs are normal. She appears well-developed and well-nourished. She is cooperative.  Non-toxic appearance. She does not appear ill. No distress.  HENT:  Head: Normocephalic.  Right Ear: Hearing, tympanic membrane, external ear and ear canal normal. Tympanic membrane is not erythematous, not retracted and not bulging.  Left Ear: Hearing, tympanic membrane, external ear and ear canal normal. Tympanic membrane is not erythematous, not retracted and not bulging.  Nose: Mucosal edema and rhinorrhea  present. Right sinus exhibits no maxillary sinus tenderness and no frontal sinus tenderness. Left sinus exhibits no maxillary sinus tenderness and no frontal sinus tenderness.  Mouth/Throat: Uvula is midline, oropharynx is clear and moist and mucous membranes are normal.  Eyes: Conjunctivae, EOM and lids are normal. Pupils are equal, round, and reactive to light. Lids are everted and swept, no foreign bodies found.  Neck: Trachea normal and normal range of motion. Neck supple. Carotid bruit is not present. No thyroid mass and no thyromegaly present.  Cardiovascular: Normal rate, regular rhythm, S1 normal, S2 normal, normal heart sounds, intact distal pulses and normal pulses.  Exam reveals no gallop and no friction rub.   No murmur heard. Pulmonary/Chest: Effort normal. No tachypnea. No respiratory distress. She has decreased breath sounds. She has wheezes in the right upper field, the right middle field, the right lower field, the left upper field, the left middle field and the left lower field. She has no rhonchi. She has no rales.  Neurological: She is alert.  Skin: Skin is warm, dry and intact. No rash noted.  Psychiatric: Her speech is normal and behavior is normal. Judgment normal. Her mood appears not anxious. Cognition and memory are normal. She does not exhibit a depressed mood.          Assessment & Plan:

## 2016-12-28 NOTE — Assessment & Plan Note (Addendum)
Likely allergy trigger. Treat with prednisone course. Continue allergy regimen. HAs follow up with PCP next week.  Albuterol prn.

## 2016-12-28 NOTE — Progress Notes (Signed)
Pre visit review using our clinic review tool, if applicable. No additional management support is needed unless otherwise documented below in the visit note. 

## 2016-12-28 NOTE — Patient Instructions (Signed)
Use albuterol as needed for wheeze. Continue allergy medications.  Complete a prednisone taper.  Call if new fever or increasing shortness of breath.

## 2016-12-31 ENCOUNTER — Ambulatory Visit: Payer: Medicare Other | Admitting: Podiatry

## 2017-01-01 ENCOUNTER — Ambulatory Visit (INDEPENDENT_AMBULATORY_CARE_PROVIDER_SITE_OTHER): Payer: Medicare Other | Admitting: Family Medicine

## 2017-01-01 ENCOUNTER — Ambulatory Visit (INDEPENDENT_AMBULATORY_CARE_PROVIDER_SITE_OTHER)
Admission: RE | Admit: 2017-01-01 | Discharge: 2017-01-01 | Disposition: A | Discharge status: Home / Self Care | Payer: Medicare Other | Source: Ambulatory Visit | Attending: Family Medicine | Admitting: Family Medicine

## 2017-01-01 ENCOUNTER — Encounter: Payer: Self-pay | Admitting: Family Medicine

## 2017-01-01 VITALS — BP 134/80 | HR 62 | Temp 98.3°F | Wt 218.2 lb

## 2017-01-01 DIAGNOSIS — J4541 Moderate persistent asthma with (acute) exacerbation: Secondary | ICD-10-CM | POA: Diagnosis not present

## 2017-01-01 DIAGNOSIS — J4520 Mild intermittent asthma, uncomplicated: Secondary | ICD-10-CM

## 2017-01-01 DIAGNOSIS — J45901 Unspecified asthma with (acute) exacerbation: Secondary | ICD-10-CM | POA: Diagnosis not present

## 2017-01-01 MED ORDER — ALBUTEROL SULFATE (2.5 MG/3ML) 0.083% IN NEBU: 2.5000 mg | INHALATION_SOLUTION | Freq: Once | RESPIRATORY_TRACT | Status: DC

## 2017-01-01 MED ORDER — DEXAMETHASONE SODIUM PHOSPHATE 10 MG/ML IJ SOLN
10.0000 mg | Freq: Once | INTRAMUSCULAR | Status: AC
  Administered 2017-01-01: 10 mg via INTRAMUSCULAR

## 2017-01-01 MED ORDER — PREDNISONE 20 MG PO TABS: ORAL_TABLET | ORAL | 0 refills | Status: DC

## 2017-01-01 MED ORDER — IPRATROPIUM-ALBUTEROL 0.5-2.5 (3) MG/3ML IN SOLN: 3.0000 mL | Freq: Once | RESPIRATORY_TRACT | Status: DC

## 2017-01-01 MED ORDER — AZITHROMYCIN 250 MG PO TABS: ORAL_TABLET | ORAL | 0 refills | Status: DC

## 2017-01-01 NOTE — Assessment & Plan Note (Addendum)
Persistent asthma exacerbation despite treatment with 6d prednisone taper (60x2, 40x2, 20x2). No new medications. ?persistent allergy triggered.  Check CXR today - clear on my read.  Dexamethasone 10mg  shot today. Extend prednisone course another 6 days.  Rx zpack with indications when to fill, to cover possible bacterial infection.  Treated with albuterol/atrovent neb in office.  Reviewed proair scheduled use over next week.  Red flags to return or seek urgent care discussed.

## 2017-01-01 NOTE — Addendum Note (Signed)
Addended by: Royann Shivers A on: 01/01/2017 01:56 PM   Modules accepted: Orders

## 2017-01-01 NOTE — Progress Notes (Signed)
Pre visit review using our clinic review tool, if applicable. No additional management support is needed unless otherwise documented below in the visit note. 

## 2017-01-01 NOTE — Patient Instructions (Addendum)
For persistent asthma exacerbation - treat with extended prednisone course as well as steroid shot today.   Schedule albuterol inhaler every 6 hours for the next 3 days.  We gave you an albuterol/atrovent nebulization treatment in office today.  Xray ok today. May take zpack if fever or worsening productive cough.  Return if not improving with treatment.

## 2017-01-01 NOTE — Progress Notes (Signed)
BP 134/80   Pulse 62   Temp 98.3 F (36.8 C) (Oral)   Wt 218 lb 4 oz (99 kg)   SpO2 96%   BMI 38.66 kg/m    CC: asthma exacerbation no better Subjective:    Patient ID: [name], female    DOB: 22-Dec-1945, 71 y.o.   MRN: [id number]  HPI: [name] is a 71 y.o. female presenting on 01/01/2017 for Follow-up and Asthma (still wheezing; dry cough; has been seen here and to ER in the last 5 days)   Seen by Dr Diona Browner 12/28/2016 with asthma exacerbation thought due to allergy (driving with windows open with pollen), treated with prednisone course. Seen the next evening at [facility] ER with duoneb treatment. Completing prednisone course without significant improvement.   Sxs include dry cough, post nasal drip, progressively worsening cough with wheezing and dyspnea. Last albuterol inhaler today was at 10am.   No fevers/chills, chest pain, worsening leg swelling.   Known h/o asthma - no recent flares (rare albuterol use). Controlled with singulair and flonase.   Relevant past medical, surgical, family and social history reviewed and updated as indicated. Interim medical history since our last visit reviewed. Allergies and medications reviewed and updated. Outpatient Medications Prior to Visit  Medication Sig Dispense Refill  . acetaminophen (TYLENOL ARTHRITIS PAIN) 650 MG CR tablet Take 1,300 mg by mouth every 8 (eight) hours as needed for pain.     Marland Kitchen aspirin EC 81 MG tablet Take 81 mg by mouth at bedtime. Melanoma prevention    . Fexofenadine HCl (ALLERGY 24-HR PO) Take by mouth.    . furosemide (LASIX) 40 MG tablet TAKE 1 TABLET (40 MG TOTAL) BY MOUTH DAILY. 30 tablet 3  . gabapentin (NEURONTIN) 100 MG capsule Take 1 capsule (100 mg total) by mouth at bedtime. 30 capsule 3  . KLOR-CON 10 10 MEQ tablet TAKE 2 TABLETS BY MOUTH DAILY. 180 tablet 3  . losartan (COZAAR) 50 MG tablet Take 1 tablet (50 mg total) by mouth daily. 30 tablet 3  . metolazone (ZAROXOLYN) 2.5 MG tablet Take  1 tablet daily 1 hour prior to furosemide. 30 tablet 4  . montelukast (SINGULAIR) 10 MG tablet TAKE 1 TABLET BY MOUTH DAILY 30 tablet 6  . Multiple Vitamin (MULTIVITAMIN WITH MINERALS) TABS tablet Take 1 tablet by mouth daily.    . Polyvinyl Alcohol-Povidone (TEARS PLUS OP) Place 1 drop into both eyes daily as needed (dry eyes/ redness/ burning).    Marland Kitchen PRESCRIPTION MEDICATION CPAP    . PROAIR HFA 108 (90 Base) MCG/ACT inhaler INHALE 2 PUFFS INTO LUNGS EVERY 6 HOURS AS NEEDED FOR WHEEZING OR SHORTNESS OF BREATH 8.5 Inhaler 3  . Probiotic Product (PROBIOTIC DAILY PO) Take 1 tablet by mouth daily.     . Vitamin D, Ergocalciferol, (DRISDOL) 50000 units CAPS capsule TAKE 1 CAPSULE (50,000 UNITS TOTAL) BY MOUTH EVERY 7 (SEVEN) DAYS. 4 capsule 3  . predniSONE (DELTASONE) 20 MG tablet 3 tabs by mouth daily x 3 days, then 2 tabs by mouth daily x 2 days then 1 tab by mouth daily x 2 days 15 tablet 0   No facility-administered medications prior to visit.      Per HPI unless specifically indicated in ROS section below Review of Systems     Objective:    BP 134/80   Pulse 62   Temp 98.3 F (36.8 C) (Oral)   Wt 218 lb 4 oz (99 kg)   SpO2 96%  BMI 38.66 kg/m   Wt Readings from Last 3 Encounters:  01/01/17 218 lb 4 oz (99 kg)  12/28/16 217 lb (98.4 kg)  12/13/16 218 lb (98.9 kg)    Physical Exam  Constitutional: She appears well-developed and well-nourished. No distress.  HENT:  Mouth/Throat: Oropharynx is clear and moist. No oropharyngeal exudate.  Eyes:  Unchanged L ptosis  Cardiovascular: Normal rate, regular rhythm, normal heart sounds and intact distal pulses.   No murmur heard. Pulmonary/Chest: No respiratory distress. She has decreased breath sounds. She has wheezes (diffuse polysymphonic). She has no rhonchi. She has no rales.  Musculoskeletal: She exhibits edema (chronic nonpitting).  Skin: Skin is warm and dry. No rash noted.  Psychiatric: She has a normal mood and affect.    Nursing note and vitals reviewed.     Assessment & Plan:   Problem List Items Addressed This Visit    Asthma exacerbation - Primary    Persistent asthma exacerbation despite treatment with 6d prednisone taper (60x2, 40x2, 20x2). No new medications. ?persistent allergy triggered.  Check CXR today - clear on my read.  Dexamethasone 10mg  shot today. Extend prednisone course another 6 days.  Rx zpack with indications when to fill, to cover possible bacterial infection.  Treated with albuterol/atrovent neb in office.  Reviewed proair scheduled use over next week.  Red flags to return or seek urgent care discussed.       Relevant Medications   albuterol (PROVENTIL) (2.5 MG/3ML) 0.083% nebulizer solution 2.5 mg   ipratropium-albuterol (DUONEB) 0.5-2.5 (3) MG/3ML nebulizer solution 3 mL   predniSONE (DELTASONE) 20 MG tablet   Other Relevant Orders   DG Chest 2 View   Asthma in adult    Previously well controlled on singulair, flonase.  Will need to monitor, consider controller medication if ongoing trouble.       Relevant Medications   albuterol (PROVENTIL) (2.5 MG/3ML) 0.083% nebulizer solution 2.5 mg   ipratropium-albuterol (DUONEB) 0.5-2.5 (3) MG/3ML nebulizer solution 3 mL   predniSONE (DELTASONE) 20 MG tablet       Follow up plan: Return if symptoms worsen or fail to improve.  [name], MD

## 2017-01-01 NOTE — Assessment & Plan Note (Signed)
Previously well controlled on singulair, flonase.  Will need to monitor, consider controller medication if ongoing trouble.

## 2017-01-02 DIAGNOSIS — I872 Venous insufficiency (chronic) (peripheral): Secondary | ICD-10-CM | POA: Diagnosis not present

## 2017-01-02 DIAGNOSIS — I890 Lymphedema, not elsewhere classified: Secondary | ICD-10-CM | POA: Diagnosis not present

## 2017-01-02 DIAGNOSIS — I10 Essential (primary) hypertension: Secondary | ICD-10-CM | POA: Diagnosis not present

## 2017-01-02 DIAGNOSIS — M4713 Other spondylosis with myelopathy, cervicothoracic region: Secondary | ICD-10-CM | POA: Diagnosis not present

## 2017-01-02 DIAGNOSIS — M419 Scoliosis, unspecified: Secondary | ICD-10-CM | POA: Diagnosis not present

## 2017-01-02 DIAGNOSIS — H43813 Vitreous degeneration, bilateral: Secondary | ICD-10-CM | POA: Diagnosis not present

## 2017-01-02 DIAGNOSIS — D3132 Benign neoplasm of left choroid: Secondary | ICD-10-CM | POA: Diagnosis not present

## 2017-01-02 DIAGNOSIS — M40203 Unspecified kyphosis, cervicothoracic region: Secondary | ICD-10-CM | POA: Diagnosis not present

## 2017-01-03 DIAGNOSIS — M40203 Unspecified kyphosis, cervicothoracic region: Secondary | ICD-10-CM | POA: Diagnosis not present

## 2017-01-03 DIAGNOSIS — I890 Lymphedema, not elsewhere classified: Secondary | ICD-10-CM | POA: Diagnosis not present

## 2017-01-03 DIAGNOSIS — M419 Scoliosis, unspecified: Secondary | ICD-10-CM | POA: Diagnosis not present

## 2017-01-03 DIAGNOSIS — I10 Essential (primary) hypertension: Secondary | ICD-10-CM | POA: Diagnosis not present

## 2017-01-03 DIAGNOSIS — M4713 Other spondylosis with myelopathy, cervicothoracic region: Secondary | ICD-10-CM | POA: Diagnosis not present

## 2017-01-03 DIAGNOSIS — I872 Venous insufficiency (chronic) (peripheral): Secondary | ICD-10-CM | POA: Diagnosis not present

## 2017-01-08 ENCOUNTER — Encounter: Payer: Self-pay | Admitting: Internal Medicine

## 2017-01-08 ENCOUNTER — Ambulatory Visit (INDEPENDENT_AMBULATORY_CARE_PROVIDER_SITE_OTHER): Payer: Medicare Other | Admitting: Internal Medicine

## 2017-01-08 VITALS — BP 120/72 | HR 69 | Temp 97.8°F | Wt 211.0 lb

## 2017-01-08 DIAGNOSIS — J4521 Mild intermittent asthma with (acute) exacerbation: Secondary | ICD-10-CM | POA: Diagnosis not present

## 2017-01-08 MED ORDER — ALBUTEROL SULFATE (2.5 MG/3ML) 0.083% IN NEBU: 2.5000 mg | INHALATION_SOLUTION | Freq: Four times a day (QID) | RESPIRATORY_TRACT | 1 refills | Status: DC | PRN

## 2017-01-08 MED ORDER — BENZONATATE 100 MG PO CAPS: 100.0000 mg | ORAL_CAPSULE | Freq: Two times a day (BID) | ORAL | 0 refills | Status: DC | PRN

## 2017-01-08 NOTE — Patient Instructions (Signed)
Asthma, Adult Asthma is a condition of the lungs in which the airways tighten and narrow. Asthma can make it hard to breathe. Asthma cannot be cured, but medicine and lifestyle changes can help control it. Asthma may be started (triggered) by:  Animal skin flakes (dander).  Dust.  Cockroaches.  Pollen.  Mold.  Smoke.  Cleaning products.  Hair sprays or aerosol sprays.  Paint fumes or strong smells.  Cold air, weather changes, and winds.  Crying or laughing hard.  Stress.  Certain medicines or drugs.  Foods, such as dried fruit, potato chips, and sparkling grape juice.  Infections or conditions (colds, flu).  Exercise.  Certain medical conditions or diseases.  Exercise or tiring activities.  Follow these instructions at home:  Take medicine as told by your doctor.  Use a peak flow meter as told by your doctor. A peak flow meter is a tool that measures how well the lungs are working.  Record and keep track of the peak flow meter's readings.  Understand and use the asthma action plan. An asthma action plan is a written plan for taking care of your asthma and treating your attacks.  To help prevent asthma attacks: ? Do not smoke. Stay away from secondhand smoke. ? Change your heating and air conditioning filter often. ? Limit your use of fireplaces and wood stoves. ? Get rid of pests (such as roaches and mice) and their droppings. ? Throw away plants if you see mold on them. ? Clean your floors. Dust regularly. Use cleaning products that do not smell. ? Have someone vacuum when you are not home. Use a vacuum cleaner with a HEPA filter if possible. ? Replace carpet with wood, tile, or vinyl flooring. Carpet can trap animal skin flakes and dust. ? Use allergy-proof pillows, mattress covers, and box spring covers. ? Wash bed sheets and blankets every week in hot water and dry them in a dryer. ? Use blankets that are made of polyester or cotton. ? Clean bathrooms  and kitchens with bleach. If possible, have someone repaint the walls in these rooms with mold-resistant paint. Keep out of the rooms that are being cleaned and painted. ? Wash hands often. Contact a doctor if:  You have make a whistling sound when breaking (wheeze), have shortness of breath, or have a cough even if taking medicine to prevent attacks.  The colored mucus you cough up (sputum) is thicker than usual.  The colored mucus you cough up changes from clear or white to yellow, green, gray, or bloody.  You have problems from the medicine you are taking such as: ? A rash. ? Itching. ? Swelling. ? Trouble breathing.  You need reliever medicines more than 2-3 times a week.  Your peak flow measurement is still at 50-79% of your personal best after following the action plan for 1 hour.  You have a fever. Get help right away if:  You seem to be worse and are not responding to medicine during an asthma attack.  You are short of breath even at rest.  You get short of breath when doing very little activity.  You have trouble eating, drinking, or talking.  You have chest pain.  You have a fast heartbeat.  Your lips or fingernails start to turn blue.  You are light-headed, dizzy, or faint.  Your peak flow is less than 50% of your personal best. This information is not intended to replace advice given to you by your health care provider. Make   sure you discuss any questions you have with your health care provider. Document Released: 04/09/2008 Document Revised: 03/29/2016 Document Reviewed: 05/21/2013 Elsevier Interactive Patient Education  2017 Elsevier Inc.  

## 2017-01-08 NOTE — Progress Notes (Signed)
Subjective:    Patient ID: [name], female    DOB: [birth year], 71 y.o.   [name]  HPI  Pt presents to the clinic today for follow up of asthma.  12/28/16, she saw Dr. Diona Browner. Diagnosed with allergy induced asthma exacerbation. Advised to continue Singulair and Flonase as prescribed. Advised to use Albuterol prn. Was prescribed Prednisone Taper. 12/30/16, went to [facility] ER, with c/o no improvement. She was given Duoneb treatment and advised to follow up with PCP. 01/01/17, follow up with Dr. Danise Mina. She was given Albuterol and Duoneb treatment if office. She was given Dexamethasone 10 mg IM and Prednisone was prescribed for an additional 6 days. She was given a Zpack and advised to fill it if symptoms were not improving. Chest xray was consistent with RAD.  At this point, she reports ongoing wheezing and cough. The cough is productive of clear mucous. She denies fever or shortness of breath. She never filled the antibiotics. She has 1 day left of the Prednisone. She is using her Albuterol every 6 hours. She is still taking the Zyrtec and Singulair.   Review of Systems      Past Medical History:  Diagnosis Date  . Anesthesia complication    trouble waking up 2/2 CPAP  . Arthritis   . Asthma in adult   . Cervical spondylosis 2012   Jacelyn Grip, [place])  . Choroidal nevus of left eye 03/22/2016  . Chronic venous insufficiency 2016   severe reflux with painful varicosities sees [facility]  . Complication of anesthesia    difficulty coming out due to sleep apnea per pt  . Difficult intubation    PATIENT DENIES   . DVT (deep venous thrombosis) ([place]) 2011   small, developed after venous ablation  . Family history of adverse reaction to anesthesia    O2 levels drop upon waking   . Hearing loss sensory, bilateral 2013   mod-severe high freq sensorineural ([facility])  . History of kidney stones 1980s  . History of radiation therapy 07/19/11-08/25/2011   RIGHT AXILLARY  REGION/METASTATIC  . Hypertension   . Kidney stones   . Melanoma ([place]) 06/29/10   MALIGNANT MELANOMA R SHOULDER/SUPRASCAPULAR BACK s/p interferon chemo and XRT  . Neuromuscular disorder ([facility])    muscle spasms  . OSA (obstructive sleep apnea)    on CPAP  . Pneumonia    2001  . RLS (restless legs syndrome)   . Seasonal allergies   . Tinnitus   . Unspecified vitamin D deficiency 03/18/2013    Current Outpatient Prescriptions  Medication Sig Dispense Refill  . acetaminophen (TYLENOL ARTHRITIS PAIN) 650 MG CR tablet Take 1,300 mg by mouth every 8 (eight) hours as needed for pain.     Marland Kitchen aspirin EC 81 MG tablet Take 81 mg by mouth at bedtime. Melanoma prevention    . azithromycin (ZITHROMAX) 250 MG tablet Take two tablets on day one followed by one tablet on days 2-5 6 each 0  . Fexofenadine HCl (ALLERGY 24-HR PO) Take by mouth.    . furosemide (LASIX) 40 MG tablet TAKE 1 TABLET (40 MG TOTAL) BY MOUTH DAILY. 30 tablet 3  . gabapentin (NEURONTIN) 100 MG capsule Take 1 capsule (100 mg total) by mouth at bedtime. 30 capsule 3  . KLOR-CON 10 10 MEQ tablet TAKE 2 TABLETS BY MOUTH DAILY. 180 tablet 3  . losartan (COZAAR) 50 MG tablet Take 1 tablet (50 mg total) by mouth daily. 30 tablet 3  . metolazone (  ZAROXOLYN) 2.5 MG tablet Take 1 tablet daily 1 hour prior to furosemide. 30 tablet 4  . montelukast (SINGULAIR) 10 MG tablet TAKE 1 TABLET BY MOUTH DAILY 30 tablet 6  . Multiple Vitamin (MULTIVITAMIN WITH MINERALS) TABS tablet Take 1 tablet by mouth daily.    . Polyvinyl Alcohol-Povidone (TEARS PLUS OP) Place 1 drop into both eyes daily as needed (dry eyes/ redness/ burning).    . predniSONE (DELTASONE) 20 MG tablet Take two tablets daily for 3 days followed by one tablet daily for 3 days 9 tablet 0  . PRESCRIPTION MEDICATION CPAP    . PROAIR HFA 108 (90 Base) MCG/ACT inhaler INHALE 2 PUFFS INTO LUNGS EVERY 6 HOURS AS NEEDED FOR WHEEZING OR SHORTNESS OF BREATH 8.5 Inhaler 3  . Probiotic Product  (PROBIOTIC DAILY PO) Take 1 tablet by mouth daily.     . Vitamin D, Ergocalciferol, (DRISDOL) 50000 units CAPS capsule TAKE 1 CAPSULE (50,000 UNITS TOTAL) BY MOUTH EVERY 7 (SEVEN) DAYS. 4 capsule 3   Current Facility-Administered Medications  Medication Dose Route Frequency Provider Last Rate Last Dose  . albuterol (PROVENTIL) (2.5 MG/3ML) 0.083% nebulizer solution 2.5 mg  2.5 mg Nebulization Once [name], MD      . ipratropium-albuterol (DUONEB) 0.5-2.5 (3) MG/3ML nebulizer solution 3 mL  3 mL Nebulization Once [name], MD        Allergies  Allergen Reactions  . Sulfa Antibiotics Itching and Swelling    Facial swelling  . Latex Other (See Comments)    Blisters ONLY HAD REACTION TO TAPE  (??? ONLY ADHESIVE ALLERGY)  . Tape Other (See Comments)    Caused blisters - must use paper tape - same reaction from NeoPrene    Family History  Problem Relation Age of Onset  . Cancer Mother 26    uterine  . CAD Father 30    MI  . Hypertension Father 12  . Alzheimer's disease Father 74  . Cancer Cousin     x2, breast  . Diabetes Sister   . Cancer Paternal Grandmother     melanoma, possibly    Social History   Social History  . Marital status: Divorced    Spouse name: N/A  . Number of children: 0  . Years of education: N/A   Occupational History  . retired Retired    Government social research officer    Social History Main Topics  . Smoking status: Former Smoker    Types: Cigarettes    Quit date: 11/05/1965  . Smokeless tobacco: Never Used     Comment: socially as a teen  . Alcohol use No  . Drug use: No  . Sexual activity: No   Other Topics Concern  . Not on file   Social History Narrative   Lives alone.  Sister and husband live next door   Lives in [place].   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   Edu: some college   Diet: poor - good water, rare fruits/vegetables     Constitutional: Denies fever, malaise, fatigue, headache or abrupt weight  changes.  HEENT: Pt reports intermittent runny nos. Denies eye pain, eye redness, ear pain, ringing in the ears, wax buildup, nasal congestion, bloody nose, or sore throat. Respiratory: Pt reports cough. Denies difficulty breathing, shortness of breath, or sputum production.   Cardiovascular: Denies chest pain, chest tightness, palpitations or swelling in the hands or feet.    No other specific complaints in a complete review of systems (  except as listed in HPI above).  Objective:   Physical Exam  BP 120/72   Pulse 69   Temp 97.8 F (36.6 C) (Oral)   Wt 211 lb (95.7 kg)   SpO2 98%   BMI 37.38 kg/m  Wt Readings from Last 3 Encounters:  01/08/17 211 lb (95.7 kg)  01/01/17 218 lb 4 oz (99 kg)  12/28/16 217 lb (98.4 kg)    General: Appears her stated age, in NAD. SHEENT:  Throat/Mouth: Teeth present, mucosa erythematous and moist, no exudate, lesions or ulcerations noted.  Neck:  No adenopathy noted. Pulmonary/Chest: Normal effort with bilateral expiratory wheezing noted. No respiratory distress. No rales or ronchi noted.    BMET    Component Value Date/Time   NA 143 10/09/2016 1145   NA 143 09/17/2016 1018   K 3.2 (L) 10/09/2016 1145   K 3.5 09/17/2016 1018   CL 99 10/09/2016 1145   CO2 30 10/09/2016 1145   CO2 28 09/17/2016 1018   GLUCOSE 93 10/09/2016 1145   BUN 20 10/09/2016 1145   BUN 19.5 09/17/2016 1018   CREATININE 0.8 10/09/2016 1145   CREATININE 0.8 09/17/2016 1018   CALCIUM 10.3 10/09/2016 1145   CALCIUM 9.8 09/17/2016 1018   GFRNONAA >60 07/03/2016 0850   GFRAA >60 07/03/2016 0850    Lipid Panel     Component Value Date/Time   CHOL 189 05/11/2013 1021   TRIG 73.0 05/11/2013 1021   TRIG 76 10/21/2012   HDL 61.30 05/11/2013 1021   CHOLHDL 3 05/11/2013 1021   VLDL 14.6 05/11/2013 1021   LDLCALC 113 (H) 05/11/2013 1021    CBC    Component Value Date/Time   WBC 6.1 10/09/2016 1145   WBC 6.4 08/13/2016 0937   RBC 4.25 10/09/2016 1145   RBC 4.14  08/13/2016 0937   HGB 12.9 10/09/2016 1145   HGB 12.8 10/21/2012   HCT 39.1 10/09/2016 1145   PLT 199 10/09/2016 1145   MCV 92 10/09/2016 1145   MCH 30.4 10/09/2016 1145   MCH 30.1 07/03/2016 0850   MCHC 33.0 10/09/2016 1145   MCHC 33.5 08/13/2016 0937   RDW 12.5 10/09/2016 1145   LYMPHSABS 1.6 10/09/2016 1145   MONOABS 0.4 08/13/2016 0937   EOSABS 0.2 10/09/2016 1145   BASOSABS 0.0 10/09/2016 1145    Hgb A1C No results found for: HGBA1C          Assessment & Plan:   Persistent Asthma Exacerbation:  Advised her to finish out Prednisone Fill the Zpack and start taking that Continue Zyrtec, Singulair and Albuterol eRx for Tessalon Pearls Albuterol neb in office today RX for nebulizer machine and Albuterol solution Advised her to call Dr. Elsworth Soho and make a follow up appt ASAP Return precautions discussed  Webb Silversmith, NP

## 2017-01-09 ENCOUNTER — Other Ambulatory Visit (HOSPITAL_BASED_OUTPATIENT_CLINIC_OR_DEPARTMENT_OTHER): Payer: Medicare Other

## 2017-01-09 ENCOUNTER — Ambulatory Visit (HOSPITAL_BASED_OUTPATIENT_CLINIC_OR_DEPARTMENT_OTHER)
Admission: RE | Admit: 2017-01-09 | Discharge: 2017-01-09 | Disposition: A | Discharge status: Home / Self Care | Payer: Medicare Other | Source: Ambulatory Visit | Attending: Hematology & Oncology | Admitting: Hematology & Oncology

## 2017-01-09 ENCOUNTER — Encounter (HOSPITAL_BASED_OUTPATIENT_CLINIC_OR_DEPARTMENT_OTHER): Payer: Self-pay

## 2017-01-09 ENCOUNTER — Telehealth: Payer: Self-pay | Admitting: *Deleted

## 2017-01-09 ENCOUNTER — Ambulatory Visit: Payer: Medicare Other

## 2017-01-09 ENCOUNTER — Ambulatory Visit (HOSPITAL_BASED_OUTPATIENT_CLINIC_OR_DEPARTMENT_OTHER): Payer: Medicare Other | Admitting: Hematology & Oncology

## 2017-01-09 ENCOUNTER — Other Ambulatory Visit: Payer: Medicare Other

## 2017-01-09 VITALS — BP 120/62 | HR 65 | Temp 98.1°F | Resp 20 | Wt 214.1 lb

## 2017-01-09 DIAGNOSIS — E876 Hypokalemia: Secondary | ICD-10-CM

## 2017-01-09 DIAGNOSIS — I700 Atherosclerosis of aorta: Secondary | ICD-10-CM | POA: Diagnosis not present

## 2017-01-09 DIAGNOSIS — R609 Edema, unspecified: Secondary | ICD-10-CM | POA: Diagnosis not present

## 2017-01-09 DIAGNOSIS — K7689 Other specified diseases of liver: Secondary | ICD-10-CM | POA: Insufficient documentation

## 2017-01-09 DIAGNOSIS — N6459 Other signs and symptoms in breast: Secondary | ICD-10-CM | POA: Diagnosis not present

## 2017-01-09 DIAGNOSIS — C434 Malignant melanoma of scalp and neck: Secondary | ICD-10-CM | POA: Insufficient documentation

## 2017-01-09 DIAGNOSIS — N281 Cyst of kidney, acquired: Secondary | ICD-10-CM | POA: Insufficient documentation

## 2017-01-09 DIAGNOSIS — C4361 Malignant melanoma of right upper limb, including shoulder: Secondary | ICD-10-CM

## 2017-01-09 DIAGNOSIS — C4310 Malignant melanoma of unspecified eyelid, including canthus: Secondary | ICD-10-CM

## 2017-01-09 LAB — CBC WITH DIFFERENTIAL (CANCER CENTER ONLY)
BASO#: 0 10*3/uL (ref 0.0–0.2)
BASO%: 0.2 % (ref 0.0–2.0)
EOS%: 1.6 % (ref 0.0–7.0)
Eosinophils Absolute: 0.3 10*3/uL (ref 0.0–0.5)
HCT: 38.3 % (ref 34.8–46.6)
HGB: 13 g/dL (ref 11.6–15.9)
LYMPH#: 4.5 10*3/uL — ABNORMAL HIGH (ref 0.9–3.3)
LYMPH%: 27.6 % (ref 14.0–48.0)
MCH: 30.2 pg (ref 26.0–34.0)
MCHC: 33.9 g/dL (ref 32.0–36.0)
MCV: 89 fL (ref 81–101)
MONO#: 1.2 10*3/uL — ABNORMAL HIGH (ref 0.1–0.9)
MONO%: 7.1 % (ref 0.0–13.0)
NEUT#: 10.3 10*3/uL — ABNORMAL HIGH (ref 1.5–6.5)
NEUT%: 63.5 % (ref 39.6–80.0)
Platelets: 217 10*3/uL (ref 145–400)
RBC: 4.3 10*6/uL (ref 3.70–5.32)
RDW: 14 % (ref 11.1–15.7)
WBC: 16.3 10*3/uL — ABNORMAL HIGH (ref 3.9–10.0)

## 2017-01-09 LAB — CMP (CANCER CENTER ONLY)
ALT(SGPT): 34 U/L (ref 10–47)
AST: 17 U/L (ref 11–38)
Albumin: 3.9 g/dL (ref 3.3–5.5)
Alkaline Phosphatase: 50 U/L (ref 26–84)
BUN, Bld: 29 mg/dL — ABNORMAL HIGH (ref 7–22)
CO2: 30 mEq/L (ref 18–33)
Calcium: 9.6 mg/dL (ref 8.0–10.3)
Chloride: 90 mEq/L — ABNORMAL LOW (ref 98–108)
Creat: 0.9 mg/dl (ref 0.6–1.2)
Glucose, Bld: 88 mg/dL (ref 73–118)
Potassium: 2.6 mEq/L — CL (ref 3.3–4.7)
Sodium: 135 mEq/L (ref 128–145)
Total Bilirubin: 0.8 mg/dl (ref 0.20–1.60)
Total Protein: 6.3 g/dL — ABNORMAL LOW (ref 6.4–8.1)

## 2017-01-09 LAB — LACTATE DEHYDROGENASE: LDH: 238 U/L (ref 125–245)

## 2017-01-09 MED ORDER — IOPAMIDOL (ISOVUE-300) INJECTION 61%
100.0000 mL | Freq: Once | INTRAVENOUS | Status: AC | PRN
  Administered 2017-01-09: 100 mL via INTRAVENOUS

## 2017-01-09 NOTE — Telephone Encounter (Signed)
Critical Value Potassium 2.6 Dr Ennever notified. No orders at this time.  

## 2017-01-09 NOTE — Progress Notes (Signed)
Hematology and Oncology Follow Up Visit  [name] [id number] August 20, 1946 71 y.o. 01/09/2017   Principle Diagnosis:  Stage IIIC (T3b N3 M0) melanoma of the right shoulder.  Current Therapy:    Observation     Interim History:  Ms.  [name] is back for followup. She is doing better. The diuretic has helped.  We did go ahead and do a CT scan of her abdomen and pelvis. This did not show any evidence of adenopathy. I'm unsure why she had the adenopathy before. It may been reactive. In May been from the lymphedema.  She probably has a nevus in the left eyelid. She is going to see ophthalmology for this. She is post have an ultrasound.   Her neck is doing quite well.   [name] had no problems with cough. [name] had no fever. [name] had no change in bowel or bladder habits.   Overall, her performance status is ECOG 1.  Medications:  Current Outpatient Prescriptions:  .  acetaminophen (TYLENOL ARTHRITIS PAIN) 650 MG CR tablet, Take 1,300 mg by mouth every 8 (eight) hours as needed for pain. , Disp: , Rfl:  .  albuterol (PROVENTIL) (2.5 MG/3ML) 0.083% nebulizer solution, Take 3 mLs (2.5 mg total) by nebulization every 6 (six) hours as needed for wheezing or shortness of breath., Disp: 150 mL, Rfl: 1 .  aspirin EC 81 MG tablet, Take 81 mg by mouth at bedtime. Melanoma prevention, Disp: , Rfl:  .  azithromycin (ZITHROMAX) 250 MG tablet, Take two tablets on day one followed by one tablet on days 2-5, Disp: 6 each, Rfl: 0 .  cetirizine (ZYRTEC) 10 MG tablet, Take 10 mg by mouth daily., Disp: , Rfl:  .  furosemide (LASIX) 40 MG tablet, TAKE 1 TABLET (40 MG TOTAL) BY MOUTH DAILY., Disp: 30 tablet, Rfl: 3 .  gabapentin (NEURONTIN) 100 MG capsule, Take 1 capsule (100 mg total) by mouth at bedtime., Disp: 30 capsule, Rfl: 3 .  KLOR-CON 10 10 MEQ tablet, TAKE 2 TABLETS BY MOUTH DAILY., Disp: 180 tablet, Rfl: 3 .  losartan (COZAAR) 50 MG tablet, Take 1 tablet (50 mg total) by mouth daily., Disp: 30  tablet, Rfl: 3 .  metolazone (ZAROXOLYN) 2.5 MG tablet, Take 1 tablet daily 1 hour prior to furosemide., Disp: 30 tablet, Rfl: 4 .  montelukast (SINGULAIR) 10 MG tablet, TAKE 1 TABLET BY MOUTH DAILY, Disp: 30 tablet, Rfl: 6 .  Multiple Vitamin (MULTIVITAMIN WITH MINERALS) TABS tablet, Take 1 tablet by mouth daily., Disp: , Rfl:  .  Polyvinyl Alcohol-Povidone (TEARS PLUS OP), Place 1 drop into both eyes daily as needed (dry eyes/ redness/ burning)., Disp: , Rfl:  .  PRESCRIPTION MEDICATION, CPAP, Disp: , Rfl:  .  PROAIR HFA 108 (90 Base) MCG/ACT inhaler, INHALE 2 PUFFS INTO LUNGS EVERY 6 HOURS AS NEEDED FOR WHEEZING OR SHORTNESS OF BREATH, Disp: 8.5 Inhaler, Rfl: 3 .  Probiotic Product (PROBIOTIC DAILY PO), Take 1 tablet by mouth daily. , Disp: , Rfl:  .  Vitamin D, Ergocalciferol, (DRISDOL) 50000 units CAPS capsule, TAKE 1 CAPSULE (50,000 UNITS TOTAL) BY MOUTH EVERY 7 (SEVEN) DAYS., Disp: 4 capsule, Rfl: 3 .  benzonatate (TESSALON) 100 MG capsule, Take 1 capsule (100 mg total) by mouth 2 (two) times daily as needed for cough. (Patient not taking: Reported on 01/09/2017), Disp: 20 capsule, Rfl: 0  Allergies:  Allergies  Allergen Reactions  . Sulfa Antibiotics Itching and Swelling    Facial swelling  . Latex Other (See  Comments)    Blisters ONLY HAD REACTION TO TAPE  (??? ONLY ADHESIVE ALLERGY)  . Tape Other (See Comments)    Caused blisters - must use paper tape - same reaction from NeoPrene    Past Medical History, Surgical history, Social history, and Family History were reviewed and updated.  Review of Systems: As above  Physical Exam:  weight is 214 lb 1.9 oz (97.1 kg). Her oral temperature is 98.1 F (36.7 C). Her blood pressure is 120/62 and her pulse is 65. Her respiration is 20.   [name] and well-nourished white female. Head and neck exam is no ocular or oral lesions. She has a healing cervical limits be scar on the anterior neck. There are no palpable cervical or  supraclavicular lymph nodes. Lungs are clear. Cardiac exam regular rate and rhythm with no murmurs rubs or bruits. Abdomen soft. Has good bowel sounds. Is no fluid wave. There is a palpable hepato- splenomegaly. Exam shows a wide local excision scar in the posterior right shoulder region. This well-healed. No tenderness over the spine ribs or hips. Axillary exam shows no bilateral axillary adenopathy. Extremities shows 1+ edema in her lower legs bilaterally. She may have some slight stasis dermatitis changes. She has decent pulses in her distal extremities. There is mild lymphedema noted in the right arm. Skin exam shows no suspicious skin lesions.  Lab Results  Component Value Date   WBC 16.3 (H) 01/09/2017   HGB 13.0 01/09/2017   HCT 38.3 01/09/2017   MCV 89 01/09/2017   PLT 217 01/09/2017     Chemistry      Component Value Date/Time   NA 135 01/09/2017 1348   NA 143 09/17/2016 1018   K 2.6 (LL) 01/09/2017 1348   K 3.5 09/17/2016 1018   CL 90 (L) 01/09/2017 1348   CO2 30 01/09/2017 1348   CO2 28 09/17/2016 1018   BUN 29 (H) 01/09/2017 1348   BUN 19.5 09/17/2016 1018   CREATININE 0.9 01/09/2017 1348   CREATININE 0.8 09/17/2016 1018      Component Value Date/Time   CALCIUM 9.6 01/09/2017 1348   CALCIUM 9.8 09/17/2016 1018   ALKPHOS 50 01/09/2017 1348   ALKPHOS 71 09/17/2016 1018   AST 17 01/09/2017 1348   AST 10 09/17/2016 1018   ALT 34 01/09/2017 1348   ALT 18 09/17/2016 1018   BILITOT 0.80 01/09/2017 1348   BILITOT 0.39 09/17/2016 1018         Impression and Plan: [name] is a 71 year old white female with stage IIIc melanoma of the right shoulder. She did undergo resection. She had 8 positive lymph nodes. She completed adjuvant interferon therapy in December of 2012. She had radiation therapy to the right axilla.  Everything looks a lot better. I'm glad of the CT scan does not show any evidence of lymphadenopathy.  Her potassium is on the low side. I told her to  take 20 mEq twice a day.  Her edema is much much better. I think adding the Zaroxolyn to her Lasix has helped. However it has really taken the potassium out of her.  I want to check her potassium in 2 weeks. If her potassium is back up to normal, we may have to readjust her potassium dosage.  I will plan see her back myself in another 3 months. I don't think we are to do any scans on her.  I hope that this nevus in the left eyelid is nothing that is malignant that  would suggest melanoma. At about her edema is much better and her legs.  [name], MD 3/7/20184:27 PM

## 2017-01-10 ENCOUNTER — Ambulatory Visit (INDEPENDENT_AMBULATORY_CARE_PROVIDER_SITE_OTHER): Payer: Medicare Other | Admitting: Adult Health

## 2017-01-10 ENCOUNTER — Encounter: Payer: Self-pay | Admitting: Adult Health

## 2017-01-10 DIAGNOSIS — J4531 Mild persistent asthma with (acute) exacerbation: Secondary | ICD-10-CM | POA: Diagnosis not present

## 2017-01-10 DIAGNOSIS — G4733 Obstructive sleep apnea (adult) (pediatric): Secondary | ICD-10-CM | POA: Diagnosis not present

## 2017-01-10 DIAGNOSIS — J301 Allergic rhinitis due to pollen: Secondary | ICD-10-CM | POA: Diagnosis not present

## 2017-01-10 NOTE — Patient Instructions (Signed)
Begin Dymista Nasal 2 puff At bedtime  , when sample is done then resume Nasacort 2 puff daily  Continue on Singulair daily  Take Zyrtec 10mg  .As needed  Drainage.  Finish Zpack as directed.  Mucinex Twice daily  As needed  Cough/congestion  Delsym 2 tsp Twice daily  As needed  Cough .  Sips of water to soothe throat .  Begin Symbicort 80 2 puffs Twice daily  , until sample is gone. Rinse well after use.  Continue on CPAP At bedtime   follow up Dr. Elsworth Soho  In 6 weeks and As needed   Please contact office for sooner follow up if symptoms do not improve or worsen or seek emergency care

## 2017-01-10 NOTE — Assessment & Plan Note (Signed)
Cont on CPAP  

## 2017-01-10 NOTE — Assessment & Plan Note (Signed)
Slow to resolve flare with AR triggers   Plan  Patient Instructions  Begin Dymista Nasal 2 puff At bedtime  , when sample is done then resume Nasacort 2 puff daily  Continue on Singulair daily  Take Zyrtec 10mg  .As needed  Drainage.  Finish Zpack as directed.  Mucinex Twice daily  As needed  Cough/congestion  Delsym 2 tsp Twice daily  As needed  Cough .  Sips of water to soothe throat .  Begin Symbicort 80 2 puffs Twice daily  , until sample is gone. Rinse well after use.  Continue on CPAP At bedtime   follow up Dr. Elsworth Soho  In 6 weeks and As needed   Please contact office for sooner follow up if symptoms do not improve or worsen or seek emergency care

## 2017-01-10 NOTE — Assessment & Plan Note (Signed)
Flare   Plan  Patient Instructions  Begin Dymista Nasal 2 puff At bedtime  , when sample is done then resume Nasacort 2 puff daily  Continue on Singulair daily  Take Zyrtec 10mg  .As needed  Drainage.  Finish Zpack as directed.  Mucinex Twice daily  As needed  Cough/congestion  Delsym 2 tsp Twice daily  As needed  Cough .  Sips of water to soothe throat .  Begin Symbicort 80 2 puffs Twice daily  , until sample is gone. Rinse well after use.  Continue on CPAP At bedtime   follow up Dr. Elsworth Soho  In 6 weeks and As needed   Please contact office for sooner follow up if symptoms do not improve or worsen or seek emergency care

## 2017-01-10 NOTE — Progress Notes (Signed)
@Patient  ID: [name], female    DOB: September 14, 1946, 71 y.o.   MRN: [id number]  Chief Complaint  Patient presents with  . Follow-up    OSA     Referring provider: [name], MD  HPI: 71 yo female followed for OSA on CPAP and Asthma/AR   TEST  Significant tests/ events  PSG 2003 which showed AHI of 81 events per hour and lowest desaturation to 57%. This was corrected by CPAP of 12 cm in with residual events of 5.6 events per hour on a study in 2007. CPAP 11/05/13 to 03/24/14 >> used on 140 of 140 nights with average 4 hrs and 36 min. Average AHI is 6.5 with median CPAP 12 cm H2O.   PFTs 04/2014- no airway obs, ratio ok, FEV1 80%, TLC & DLCO nml   01/10/2017 Follow up : Asthma /ER follow up  Pt presents for a follow up from recent ER . She was seen in ER at [facility] 2 weesk ago . Complains of cough , wheezing , drainage , sinus congestion for last 3 weeks. Wheezing got worse . Went to ER on 2/25, tx w/ neb treatment. Sx persisted and went to PCP last 2 weeks. . Started on Zpack. And prednisone pack.  CXR on 2/27 was clear .  She is feeling some better w/ less cough and wheezing . She has 2 days of Zithromax left.  She has been using mucinex and nasacort intermittently .  Still has some interittent tightness, lots of post nasal drainage.   Has OSA .doing well on CPAP .  Feels rested.   Allergies  Allergen Reactions  . Sulfa Antibiotics Itching and Swelling    Facial swelling  . Latex Other (See Comments)    Blisters ONLY HAD REACTION TO TAPE  (??? ONLY ADHESIVE ALLERGY)  . Tape Other (See Comments)    Caused blisters - must use paper tape - same reaction from NeoPrene    Immunization History  Administered Date(s) Administered  . Influenza, High Dose Seasonal PF 08/13/2016  . Influenza,inj,Quad PF,36+ Mos 08/06/2013, 09/17/2014, 08/18/2015  . Pneumococcal Conjugate-13 09/22/2014  . Pneumococcal Polysaccharide-23 05/15/2013    Past Medical History:    Diagnosis Date  . Anesthesia complication    trouble waking up 2/2 CPAP  . Arthritis   . Asthma in adult   . Cervical spondylosis 2012   [facility], [place])  . Choroidal nevus of left eye 03/22/2016  . Chronic venous insufficiency 2016   severe reflux with painful varicosities sees [facility]  . Complication of anesthesia    difficulty coming out due to sleep apnea per pt  . Difficult intubation    PATIENT DENIES   . DVT (deep venous thrombosis) ([place]) 2011   small, developed after venous ablation  . Family history of adverse reaction to anesthesia    O2 levels drop upon waking   . Hearing loss sensory, bilateral 2013   mod-severe high freq sensorineural ([facility])  . History of kidney stones 1980s  . History of radiation therapy 07/19/11-08/25/2011   RIGHT AXILLARY REGION/METASTATIC  . Hypertension   . Kidney stones   . Melanoma ([place]) 06/29/10   MALIGNANT MELANOMA R SHOULDER/SUPRASCAPULAR BACK s/p interferon chemo and XRT  . Neuromuscular disorder ([facility])    muscle spasms  . OSA (obstructive sleep apnea)    on CPAP  . Pneumonia    2001  . RLS (restless legs syndrome)   . Seasonal allergies   . Tinnitus   .  Unspecified vitamin D deficiency 03/18/2013    Tobacco History: History  Smoking Status  . Former Smoker  . Types: Cigarettes  . Quit date: 11/05/1965  Smokeless Tobacco  . Never Used    Comment: socially as a teen   Counseling given: Not Answered   Outpatient Encounter Prescriptions as of 01/10/2017  Medication Sig  . acetaminophen (TYLENOL ARTHRITIS PAIN) 650 MG CR tablet Take 1,300 mg by mouth every 8 (eight) hours as needed for pain.   Marland Kitchen albuterol (PROVENTIL) (2.5 MG/3ML) 0.083% nebulizer solution Take 3 mLs (2.5 mg total) by nebulization every 6 (six) hours as needed for wheezing or shortness of breath.  Marland Kitchen aspirin EC 81 MG tablet Take 81 mg by mouth at bedtime. Melanoma prevention  . azithromycin (ZITHROMAX) 250 MG tablet Take two tablets on day one followed by one  tablet on days 2-5  . benzonatate (TESSALON) 100 MG capsule Take 1 capsule (100 mg total) by mouth 2 (two) times daily as needed for cough. (Patient not taking: Reported on 01/09/2017)  . cetirizine (ZYRTEC) 10 MG tablet Take 10 mg by mouth daily.  . furosemide (LASIX) 40 MG tablet TAKE 1 TABLET (40 MG TOTAL) BY MOUTH DAILY.  Marland Kitchen gabapentin (NEURONTIN) 100 MG capsule Take 1 capsule (100 mg total) by mouth at bedtime.  Marland Kitchen KLOR-CON 10 10 MEQ tablet TAKE 2 TABLETS BY MOUTH DAILY.  Marland Kitchen losartan (COZAAR) 50 MG tablet Take 1 tablet (50 mg total) by mouth daily.  . metolazone (ZAROXOLYN) 2.5 MG tablet Take 1 tablet daily 1 hour prior to furosemide.  . montelukast (SINGULAIR) 10 MG tablet TAKE 1 TABLET BY MOUTH DAILY  . Multiple Vitamin (MULTIVITAMIN WITH MINERALS) TABS tablet Take 1 tablet by mouth daily.  . Polyvinyl Alcohol-Povidone (TEARS PLUS OP) Place 1 drop into both eyes daily as needed (dry eyes/ redness/ burning).  Marland Kitchen PRESCRIPTION MEDICATION CPAP  . PROAIR HFA 108 (90 Base) MCG/ACT inhaler INHALE 2 PUFFS INTO LUNGS EVERY 6 HOURS AS NEEDED FOR WHEEZING OR SHORTNESS OF BREATH  . Probiotic Product (PROBIOTIC DAILY PO) Take 1 tablet by mouth daily.   . Vitamin D, Ergocalciferol, (DRISDOL) 50000 units CAPS capsule TAKE 1 CAPSULE (50,000 UNITS TOTAL) BY MOUTH EVERY 7 (SEVEN) DAYS.   No facility-administered encounter medications on file as of 01/10/2017.      Review of Systems  Constitutional:   No  weight loss, night sweats,  Fevers, chills, fatigue, or  lassitude.  HEENT:   No headaches,  Difficulty swallowing,  Tooth/dental problems, or  Sore throat,                No sneezing, itching, ear ache,  +nasal congestion, post nasal drip,   CV:  No chest pain,  Orthopnea, PND, swelling in lower extremities, anasarca, dizziness, palpitations, syncope.   GI  No heartburn, indigestion, abdominal pain, nausea, vomiting, diarrhea, change in bowel habits, loss of appetite, bloody stools.   Resp:  .  No  chest wall deformity  Skin: no rash or lesions.  GU: no dysuria, change in color of urine, no urgency or frequency.  No flank pain, no hematuria   MS:  No joint pain or swelling.  No decreased range of motion.  No back pain.    Physical Exam  BP 104/65   Pulse 83   Temp 97.8 F (36.6 C)   Ht 5\' 3"  (1.6 m)   Wt 213 lb (96.6 kg)   SpO2 98%   BMI 37.73 kg/m   GEN:  A/Ox3; pleasant , NAD, elderly    HEENT:  [place]/AT,  EACs-clear, TMs-wnl, NOSE-clear drainage , THROAT-clear, no lesions, no postnasal drip or exudate noted.   NECK:  Supple w/ fair ROM; no JVD; normal carotid impulses w/o bruits; no thyromegaly or nodules palpated; no lymphadenopathy.    RESP  Clear  P & A; w/o, wheezes/ rales/ or rhonchi. no accessory muscle use, no dullness to percussion  CARD:  RRR, no m/r/g, no peripheral edema, pulses intact, no cyanosis or clubbing.  GI:   Soft & nt; nml bowel sounds; no organomegaly or masses detected.   Musco: Warm bil, no deformities or joint swelling noted.   Neuro: alert, no focal deficits noted.    Skin: Warm, no lesions or rashes    Lab Results:   BNP No results found for: BNP  ProBNP  Imaging: Dg Chest 2 View  Result Date: 01/01/2017 CLINICAL DATA:  Asthma exacerbation. Former smoker. History of hypertension, cutaneous melanoma of the right shoulder. EXAM: CHEST  2 VIEW COMPARISON:  Chest x-ray of Mar 17, 2014 FINDINGS: The lungs are well-expanded with mild hemidiaphragm flattening. There is no focal infiltrate. There is no pleural effusion. The heart and pulmonary vascularity are normal. There is calcification in the wall of the aortic arch. There is old deformity of the right lateral ribs due to previous fractures. There is multilevel degenerative disc disease of the thoracic spine. IMPRESSION: Borderline hyperinflation consistent with reactive airway disease. No pneumonia nor CHF. No pulmonary parenchymal nodules or masses are observed. Thoracic aortic  atherosclerosis. Electronically Signed   By: David  Martinique M.D.   On: 01/01/2017 14:50   Ct Abdomen Pelvis W Contrast  Result Date: 01/09/2017 CLINICAL DATA:  Restaging right shoulder malignant melanoma. Indeterminate inguinal adenopathy. History of hypertension and asthma. EXAM: CT ABDOMEN AND PELVIS WITH CONTRAST TECHNIQUE: Multidetector CT imaging of the abdomen and pelvis was performed using the standard protocol following bolus administration of intravenous contrast. CONTRAST:  173mL ISOVUE-300 IOPAMIDOL (ISOVUE-300) INJECTION 61% COMPARISON:  CT 09/20/2016.  PET-CT 01/08/2013. FINDINGS: Lower chest: Mildly progressive linear scarring or atelectasis at both lung bases. No significant pleural or pericardial effusion. There is asymmetric dermal thickening within the visualized right breast which appears similar to most recent CT. Hepatobiliary: Stable low-density hepatic lesions, likely cysts. The largest measure 3.7 cm in the left lobe (image 12) and 1.3 cm inferiorly in the right lobe (image 22). No new or enlarging hepatic lesions. Possible tiny noncalcified gallstones. No gallbladder wall thickening, surrounding inflammation or biliary dilatation. Pancreas: Stable coarse calcification within the pancreatic tail on image 20. There is focal fat within the pancreatic head and uncinate process. No evidence of pancreatic mass, ductal dilatation or surrounding inflammation. Spleen: Normal in size without focal abnormality. Adrenals/Urinary Tract: Both adrenal glands appear normal. Stable bilateral renal cysts, largest in the interpolar region of the left kidney, measuring up to 8.2 cm in diameter. No hydronephrosis or urinary tract calculus. The bladder appears unremarkable. Stomach/Bowel: No evidence of bowel wall thickening, distention or surrounding inflammatory change. Stable mild colonic diverticulosis. The appendix appears normal. Vascular/Lymphatic: There are no enlarged abdominal or pelvic lymph nodes.  Small inguinal lymph nodes bilaterally are not pathologically enlarged and demonstrate normal fatty hila on the coronal images. Mild aortoiliac atherosclerosis. No acute vascular findings. Reproductive: The uterus appears stable.  No adnexal mass. Other: The anterior abdominal wall is intact. No ascites or peritoneal nodularity. Musculoskeletal: No acute or significant osseous findings. Stable mild lumbar spondylosis. IMPRESSION: 1. Asymmetric  dermal thickening in the right breast appears similar to prior study from 4 months ago, presumably chronic inflammation. Correlate clinically. Mammographic correlation recommended if not recently performed. 2. No pathologically enlarged lymph nodes are seen. Small inguinal lymph nodes demonstrate normal morphology, likely reactive. 3. Stable additional incidental findings, including hepatic and renal cysts and atherosclerosis. Electronically Signed   By: Richardean Sale M.D.   On: 01/09/2017 15:24     Assessment & Plan:   No problem-specific Assessment & Plan notes found for this encounter.     Rexene Edison, NP 01/10/2017

## 2017-01-14 NOTE — Progress Notes (Signed)
Reviewed & agree with plan  

## 2017-01-18 DIAGNOSIS — I890 Lymphedema, not elsewhere classified: Secondary | ICD-10-CM | POA: Diagnosis not present

## 2017-01-18 DIAGNOSIS — I10 Essential (primary) hypertension: Secondary | ICD-10-CM | POA: Diagnosis not present

## 2017-01-18 DIAGNOSIS — M419 Scoliosis, unspecified: Secondary | ICD-10-CM | POA: Diagnosis not present

## 2017-01-18 DIAGNOSIS — M4713 Other spondylosis with myelopathy, cervicothoracic region: Secondary | ICD-10-CM | POA: Diagnosis not present

## 2017-01-18 DIAGNOSIS — M40203 Unspecified kyphosis, cervicothoracic region: Secondary | ICD-10-CM | POA: Diagnosis not present

## 2017-01-18 DIAGNOSIS — I872 Venous insufficiency (chronic) (peripheral): Secondary | ICD-10-CM | POA: Diagnosis not present

## 2017-01-22 ENCOUNTER — Encounter: Payer: Medicare Other | Attending: Internal Medicine | Admitting: Internal Medicine

## 2017-01-22 DIAGNOSIS — Z85820 Personal history of malignant melanoma of skin: Secondary | ICD-10-CM | POA: Insufficient documentation

## 2017-01-22 DIAGNOSIS — Z882 Allergy status to sulfonamides status: Secondary | ICD-10-CM | POA: Diagnosis not present

## 2017-01-22 DIAGNOSIS — L97228 Non-pressure chronic ulcer of left calf with other specified severity: Secondary | ICD-10-CM | POA: Insufficient documentation

## 2017-01-22 DIAGNOSIS — I872 Venous insufficiency (chronic) (peripheral): Secondary | ICD-10-CM | POA: Diagnosis not present

## 2017-01-22 DIAGNOSIS — Z86718 Personal history of other venous thrombosis and embolism: Secondary | ICD-10-CM | POA: Insufficient documentation

## 2017-01-22 DIAGNOSIS — Z87891 Personal history of nicotine dependence: Secondary | ICD-10-CM | POA: Diagnosis not present

## 2017-01-22 DIAGNOSIS — M1990 Unspecified osteoarthritis, unspecified site: Secondary | ICD-10-CM | POA: Insufficient documentation

## 2017-01-22 DIAGNOSIS — Z8249 Family history of ischemic heart disease and other diseases of the circulatory system: Secondary | ICD-10-CM | POA: Diagnosis not present

## 2017-01-22 DIAGNOSIS — I10 Essential (primary) hypertension: Secondary | ICD-10-CM | POA: Insufficient documentation

## 2017-01-22 DIAGNOSIS — I890 Lymphedema, not elsewhere classified: Secondary | ICD-10-CM | POA: Diagnosis not present

## 2017-01-22 DIAGNOSIS — L97221 Non-pressure chronic ulcer of left calf limited to breakdown of skin: Secondary | ICD-10-CM | POA: Diagnosis not present

## 2017-01-23 ENCOUNTER — Other Ambulatory Visit: Payer: Medicare Other

## 2017-01-23 ENCOUNTER — Other Ambulatory Visit: Payer: Self-pay | Admitting: Family Medicine

## 2017-01-23 NOTE — Progress Notes (Signed)
Amber, [name] ([id number]) Visit Report for 01/22/2017 Abuse/Suicide Risk Screen Details Patient Name: [name], [name]. Date of Service: 01/22/2017 2:30 PM Medical Record Patient Account Number: [id number] Number: Treating RN: Montey Hora 07-23-1946 (71 y.o. Other Clinician: Date of Birth/Sex: Female) Treating ROBSON, MICHAEL Primary Care Luca Dyar: [name] Jaymes Revels/Extender: G Referring Bodhi Moradi: Donzetta Sprung in Treatment: 0 Abuse/Suicide Risk Screen Items Answer ABUSE/SUICIDE RISK SCREEN: Has anyone close to you tried to hurt or harm you recentlyo No Do you feel uncomfortable with anyone in your familyo No Has anyone forced you do things that you didnot want to doo No Do you have any thoughts of harming yourselfo No Patient displays signs or symptoms of abuse and/or neglect. No Electronic Signature(s) Signed: 01/22/2017 4:10:27 PM By: Montey Hora Entered By: Montey Hora on 01/22/2017 14:45:20 Hohman, [name] ([id number]) -------------------------------------------------------------------------------- Activities of Daily Living Details Patient Name: Amber, [name]. Date of Service: 01/22/2017 2:30 PM Medical Record Patient Account Number: [id number] Number: Treating RN: Montey Hora [birth year] (71 y.o. Other Clinician: Date of Birth/Sex: Female) Treating ROBSON, MICHAEL Primary Care Earsie Humm: [name] Findley Vi/Extender: G Referring Brittanni Cariker: Donzetta Sprung in Treatment: 0 Activities of Daily Living Items Answer Activities of Daily Living (Please select one for each item) Drive Automobile Completely Able Take Medications Completely Able Use Telephone Completely Able Care for Appearance Completely Able Use Toilet Completely Able Bath / Shower Completely Able Dress Self Completely Able Feed Self Completely Able Walk Completely Able Get In / Out Bed Completely Able Housework Completely Able Prepare  Meals Completely [place] for Self Completely Able Electronic Signature(s) Signed: 01/22/2017 3:00:35 PM By: Montey Hora Entered By: Montey Hora on 01/22/2017 15:00:34 [name] ([id number]) -------------------------------------------------------------------------------- Education Assessment Details Patient Name: Amber, [name]. Date of Service: 01/22/2017 2:30 PM Medical Record Patient Account Number: [id number] Number: Treating RN: Montey Hora Jun 19, 1946 (71 y.o. Other Clinician: Date of Birth/Sex: Female) Treating ROBSON, MICHAEL Primary Care Yassine Brunsman: [name] Corene Resnick/Extender: G Referring Tim Wilhide: Donzetta Sprung in Treatment: 0 Primary Learner Assessed: Patient Learning Preferences/Education Level/Primary Language Learning Preference: Explanation, Demonstration Highest Education Level: College or Above Preferred Language: English Cognitive Barrier Assessment/Beliefs Language Barrier: No Translator Needed: No Memory Deficit: No Emotional Barrier: No Cultural/Religious Beliefs Affecting Medical No Care: Physical Barrier Assessment Impaired Vision: No Impaired Hearing: No Decreased Hand dexterity: No Knowledge/Comprehension Assessment Knowledge Level: Medium Comprehension Level: Medium Ability to understand written Medium instructions: Ability to understand verbal Medium instructions: Motivation Assessment Anxiety Level: Calm Cooperation: Cooperative Education Importance: Acknowledges Need Interest in Health Problems: Asks Questions Perception: Coherent Willingness to Engage in Self- Medium Management Activities: Readiness to Engage in Self- Medium Management Activities: KATALEYA, ZAUGG ([id number]) Electronic Signature(s) Signed: 01/22/2017 3:01:00 PM By: Montey Hora Entered By: Montey Hora on 01/22/2017 15:01:00 TELIYAH, ROYAL  ([id number]) -------------------------------------------------------------------------------- Fall Risk Assessment Details Patient Name: [name]. Date of Service: 01/22/2017 2:30 PM Medical Record Patient Account Number: [id number] Number: Treating RN: Montey Hora [birth year] (71 y.o. Other Clinician: Date of Birth/Sex: Female) Treating ROBSON, MICHAEL Primary Care Alonte Wulff: [name] Xachary Hambly/Extender: G Referring Vonnetta Akey: Donzetta Sprung in Treatment: 0 Fall Risk Assessment Items Have you had 2 or more falls in the last 12 monthso 0 No Have you had any fall that resulted in injury in the last 12 monthso 0 No FALL RISK ASSESSMENT: History of falling - immediate or within 3 months 0 No Secondary diagnosis 0 No Ambulatory aid None/bed rest/wheelchair/nurse 0 Yes  Crutches/cane/walker 0 No Furniture 0 No IV Access/Saline Lock 0 No Gait/Training Normal/bed rest/immobile 0 Yes Weak 0 No Impaired 0 No Mental Status Oriented to own ability 0 Yes Electronic Signature(s) Signed: 01/22/2017 3:01:21 PM By: Montey Hora Entered By: Montey Hora on 01/22/2017 15:01:21 Lueth, [name] ([id number]) -------------------------------------------------------------------------------- Foot Assessment Details Patient Name: Amber, [name]. Date of Service: 01/22/2017 2:30 PM Medical Record Patient Account Number: [id number] Number: Treating RN: Montey Hora 1946/01/14 (71 y.o. Other Clinician: Date of Birth/Sex: Female) Treating ROBSON, MICHAEL Primary Care Havyn Ramo: [name] Chisom Muntean/Extender: G Referring Syria Kestner: Donzetta Sprung in Treatment: 0 Foot Assessment Items Site Locations + = Sensation present, - = Sensation absent, C = Callus, U = Ulcer R = Redness, W = Warmth, M = Maceration, PU = Pre-ulcerative lesion F = Fissure, S = Swelling, D = Dryness Assessment Right: Left: Other Deformity: No No Prior Foot Ulcer: No  No Prior Amputation: No No Charcot Joint: No No Ambulatory Status: Ambulatory Without Help Gait: Steady Electronic Signature(s) Signed: 01/22/2017 4:10:27 PM By: Montey Hora Entered By: Montey Hora on 01/22/2017 14:48:51 Rembert, [name] ([id number]) -------------------------------------------------------------------------------- Nutrition Risk Assessment Details Patient Name: Amber, KUKLINSKI. Date of Service: 01/22/2017 2:30 PM Medical Record Patient Account Number: [id number] Number: Treating RN: Montey Hora 06/09/1946 (70 y.o. Other Clinician: Date of Birth/Sex: Female) Treating ROBSON, MICHAEL Primary Care Oyinkansola Truax: [name] Dorris Pierre/Extender: G Referring Avon Molock: Donzetta Sprung in Treatment: 0 Height (in): 63 Weight (lbs): 218 Body Mass Index (BMI): 38.6 Nutrition Risk Assessment Items NUTRITION RISK SCREEN: I have an illness or condition that made me change the kind and/or 0 No amount of food I eat I eat fewer than two meals per day 0 No I eat few fruits and vegetables, or milk products 0 No I have three or more drinks of beer, liquor or wine almost every day 0 No I have tooth or mouth problems that make it hard for me to eat 0 No I don't always have enough money to buy the food I need 0 No I eat alone most of the time 0 No I take three or more different prescribed or over-the-counter drugs a 1 Yes day Without wanting to, I have lost or gained 10 pounds in the last six 0 No months I am not always physically able to shop, cook and/or feed myself 0 No Nutrition Protocols Good Risk Protocol 0 No interventions needed Moderate Risk Protocol Electronic Signature(s) Signed: 01/22/2017 3:01:37 PM By: Montey Hora Entered By: Montey Hora on 01/22/2017 15:01:37

## 2017-01-23 NOTE — Progress Notes (Signed)
SOPHEA, RACKHAM ([id number]) Visit Report for 01/22/2017 Allergy List Details Patient Name: [name], [name]. Date of Service: 01/22/2017 2:30 PM Medical Record Number: [id number] Patient Account Number: [id number] Date of Birth/Sex: 1946-03-14 (71 y.o. Female) Treating RN: Montey Hora Primary Care Areta Terwilliger: [name] Other Clinician: Referring Margaretta Chittum: [name] Treating Kaymon Denomme/Extender: [name] Dillon Weeks in Treatment: 0 Allergies Active Allergies Sulfa (Sulfonamide Antibiotics) Reaction: facial swelling Allergy Notes Electronic Signature(s) Signed: 01/22/2017 4:10:27 PM By: Montey Hora Entered By: Montey Hora on 01/22/2017 14:44:12 Steinauer, [name] ([id number]) -------------------------------------------------------------------------------- Arrival Information Details Patient Name: [name], [name]. Date of Service: 01/22/2017 2:30 PM Medical Record Number: [id number] Patient Account Number: [id number] Date of Birth/Sex: Jun 29, 1946 (71 y.o. Female) Treating RN: Montey Hora Primary Care Channing Yeager: [name] Other Clinician: Referring Daviel Allegretto: [name] Treating Oluwademilade Mckiver/Extender: Tito Dine in Treatment: 0 Visit Information Patient Arrived: Ambulatory Arrival Time: 14:41 Accompanied By: self Transfer Assistance: None Patient Identification Verified: Yes Secondary Verification Process Yes Completed: History Since Last Visit Added or deleted any medications: No Any new allergies or adverse reactions: No Had a fall or experienced change in activities of daily living that may affect risk of falls: No Signs or symptoms of abuse/neglect since last visito No Hospitalized since last visit: No Pain Present Now: No Electronic Signature(s) Signed: 01/22/2017 4:10:27 PM By: Montey Hora Entered By: Montey Hora on 01/22/2017 14:41:28 Hassan, [name]  ([id number]) -------------------------------------------------------------------------------- Clinic Level of Care Assessment Details Patient Name: [name] Date of Service: 01/22/2017 2:30 PM Medical Record Number: [id number] Patient Account Number: [id number] Date of Birth/Sex: 1946-10-17 (71 y.o. Female) Treating RN: Montey Hora Primary Care Bucky Grigg: [name] Other Clinician: Referring Murel Wigle: [name] Treating Madelline Eshbach/Extender: Tito Dine in Treatment: 0 Clinic Level of Care Assessment Items TOOL 1 Quantity Score []  - Use when EandM and Procedure is performed on INITIAL visit 0 ASSESSMENTS - Nursing Assessment / Reassessment X - General Physical Exam (combine w/ comprehensive assessment (listed just 1 20 below) when performed on new pt. evals) X - Comprehensive Assessment (HX, ROS, Risk Assessments, Wounds Hx, etc.) 1 25 ASSESSMENTS - Wound and Skin Assessment / Reassessment []  - Dermatologic / Skin Assessment (not related to wound area) 0 ASSESSMENTS - Ostomy and/or Continence Assessment and Care []  - Incontinence Assessment and Management 0 []  - Ostomy Care Assessment and Management (repouching, etc.) 0 PROCESS - Coordination of Care X - Simple Patient / Family Education for ongoing care 1 15 []  - Complex (extensive) Patient / Family Education for ongoing care 0 []  - Staff obtains Programmer, systems, Records, Test Results / Process Orders 0 []  - Staff telephones HHA, Nursing Homes / Clarify orders / etc 0 []  - Routine Transfer to another Facility (non-emergent condition) 0 []  - Routine Hospital Admission (non-emergent condition) 0 X - New Admissions / Biomedical engineer / Ordering NPWT, Apligraf, etc. 1 15 []  - Emergency Hospital Admission (emergent condition) 0 PROCESS - Special Needs []  - Pediatric / Minor Patient Management 0 []  - Isolation Patient Management 0 KAHLA, RISDON. ([id number]) []  - Hearing / Language / Visual special  needs 0 []  - Assessment of Community assistance (transportation, D/C planning, etc.) 0 []  - Additional assistance / Altered mentation 0 []  - Support Surface(s) Assessment (bed, cushion, seat, etc.) 0 INTERVENTIONS - Miscellaneous []  - External ear exam 0 []  - Patient Transfer (multiple staff / Civil Service fast streamer / Similar devices) 0 []  - Simple Staple / Suture removal (25 or less) 0 []  - Complex Staple /  Suture removal (26 or more) 0 []  - Hypo/Hyperglycemic Management (do not check if billed separately) 0 X - Ankle / Brachial Index (ABI) - do not check if billed separately 1 15 Has the patient been seen at the hospital within the last three years: Yes Total Score: 90 Level Of Care: New/Established - Level 3 Electronic Signature(s) Signed: 01/22/2017 4:10:27 PM By: Montey Hora Entered By: Montey Hora on 01/22/2017 15:11:31 Dawe, [name] ([id number]) -------------------------------------------------------------------------------- Encounter Discharge Information Details Patient Name: [name], [name]. Date of Service: 01/22/2017 2:30 PM Medical Record Number: [id number] Patient Account Number: [id number] Date of Birth/Sex: 14-Sep-1946 (71 y.o. Female) Treating RN: Montey Hora Primary Care Avri Paiva: [name] Other Clinician: Referring Etha Stambaugh: [name] Treating Jakari Sada/Extender: Tito Dine in Treatment: 0 Encounter Discharge Information Items Discharge Pain Level: 0 Discharge Condition: Stable Ambulatory Status: Ambulatory Discharge Destination: Home Transportation: Private Auto Accompanied By: self Schedule Follow-up Appointment: Yes Medication Reconciliation completed and provided to Patient/Care No Edwar Coe: Provided on Clinical Summary of Care: 01/22/2017 Form Type Recipient Paper Patient LP Electronic Signature(s) Signed: 01/22/2017 3:24:42 PM By: Ruthine Dose Entered By: Ruthine Dose on 01/22/2017 15:24:41 Brinkman, [name]  ([id number]) -------------------------------------------------------------------------------- Lower Extremity Assessment Details Patient Name: [name], [name]. Date of Service: 01/22/2017 2:30 PM Medical Record Number: [id number] Patient Account Number: [id number] Date of Birth/Sex: 07-Jun-1946 (71 y.o. Female) Treating RN: Montey Hora Primary Care Krishawna Stiefel: [name] Other Clinician: Referring Yazmine Sorey: [name] Treating Carmine Youngberg/Extender: Tito Dine in Treatment: 0 Edema Assessment Assessed: [Left: No] [Right: No] Edema: [Left: Yes] [Right: Yes] Calf Left: Right: Point of Measurement: 32 cm From Medial Instep 44 cm 40.3 cm Ankle Left: Right: Point of Measurement: 10 cm From Medial Instep 25.5 cm 26 cm Vascular Assessment Pulses: Dorsalis Pedis Palpable: [Left:Yes] [Right:Yes] Doppler Audible: [Left:Yes] [Right:Yes] Posterior Tibial Palpable: [Left:Yes] [Right:Yes] Doppler Audible: [Left:Yes] [Right:Yes] Extremity colors, hair growth, and conditions: Extremity Color: [Left:Hyperpigmented] [Right:Hyperpigmented] Hair Growth on Extremity: [Left:Yes] [Right:Yes] Temperature of Extremity: [Left:Warm] [Right:Warm] Capillary Refill: [Left:< 3 seconds] [Right:< 3 seconds] Blood Pressure: Brachial: [Left:126] [Right:116] Dorsalis Pedis: 132 [Left:Dorsalis Pedis: 361] Ankle: Posterior Tibial: 164 [Left:Posterior Tibial: 152 1.30] [Right:1.21] Toe Nail Assessment Left: Right: Thick: Yes Yes Discolored: No No Deformed: No No Improper Length and Hygiene: No No BUFFI, EWTON ([id number]) Electronic Signature(s) Signed: 01/22/2017 4:10:27 PM By: Montey Hora Entered By: Montey Hora on 01/22/2017 14:57:47 Virgil, [name] Lenna Sciara ([id number]) -------------------------------------------------------------------------------- Multi Wound Chart Details Patient Name: [name], GING. Date of Service: 01/22/2017 2:30 PM Medical Record Number:  [id number] Patient Account Number: [id number] Date of Birth/Sex: 22-Jul-1946 (71 y.o. Female) Treating RN: Montey Hora Primary Care Addelynn Batte: [name] Other Clinician: Referring Fenna Semel: [name] Treating Bedford Winsor/Extender: Tito Dine in Treatment: 0 Vital Signs Height(in): 63 Pulse(bpm): 70 Weight(lbs): 218 Blood Pressure 120/70 (mmHg): Body Mass Index(BMI): 39 Temperature(F): 97.5 Respiratory Rate 18 (breaths/min): Photos: [3:No Photos] [N/A:N/A] Wound Location: [3:Left Lower Leg - Posterior] [N/A:N/A] Wounding Event: [3:Blister] [N/A:N/A] Primary Etiology: [3:Venous Leg Ulcer] [N/A:N/A] Comorbid History: [3:Cataracts, Asthma, Sleep Apnea, Deep Vein Thrombosis, Hypertension, Peripheral Venous Disease, Osteoarthritis, Received Chemotherapy, Received Radiation] [N/A:N/A] Date Acquired: [3:01/22/2017] [N/A:N/A] Weeks of Treatment: [3:0] [N/A:N/A] Wound Status: [3:Open] [N/A:N/A] Measurements L x W x D 0.2x0.2x0.1 [N/A:N/A] (cm) Area (cm) : [3:0.031] [N/A:N/A] Volume (cm) : [3:0.003] [N/A:N/A] Classification: [3:Partial Thickness] [N/A:N/A] Exudate Amount: [3:Large] [N/A:N/A] Exudate Type: [3:Serous] [N/A:N/A] Exudate Color: [3:amber] [N/A:N/A] Wound Margin: [3:Flat and Intact] [N/A:N/A] Granulation Amount: [3:Large (67-100%)] [N/A:N/A] Granulation Quality: [3:Pink] [N/A:N/A] Necrotic Amount: [3:None Present (0%)] [  N/A:N/A] Exposed Structures: [3:Fascia: No Fat Layer (Subcutaneous Tissue) Exposed: No] [N/A:N/A] Tendon: No Muscle: No Joint: No Bone: No Limited to Skin Breakdown Epithelialization: Small (1-33%) N/A N/A Periwound Skin Texture: Excoriation: No N/A N/A Induration: No Callus: No Crepitus: No Rash: No Scarring: No Periwound Skin Maceration: No N/A N/A Moisture: Dry/Scaly: No Periwound Skin Color: Atrophie Blanche: No N/A N/A Cyanosis: No Ecchymosis: No Erythema: No Hemosiderin Staining: No Mottled: No Pallor:  No Rubor: No Temperature: No Abnormality N/A N/A Tenderness on No N/A N/A Palpation: Wound Preparation: Ulcer Cleansing: N/A N/A Rinsed/Irrigated with Saline Topical Anesthetic Applied: None Treatment Notes Wound #3 (Left, Posterior Lower Leg) 1. Cleansed with: Clean wound with Normal Saline 4. Dressing Applied: Aquacel Ag 7. Secured with Tape 3 Layer Compression System - Left Lower Extremity Electronic Signature(s) Signed: 01/22/2017 4:28:03 PM By: Linton Ham MD Entered By: Linton Ham on 01/22/2017 15:48:03 LATRICIA, CERRITO ([id number]) REKIA, KUJALA ([id number]) -------------------------------------------------------------------------------- Multi-Disciplinary Care Plan Details Patient Name: [name], [name]. Date of Service: 01/22/2017 2:30 PM Medical Record Number: [id number] Patient Account Number: [id number] Date of Birth/Sex: [birth year] (71 y.o. Female) Treating RN: Montey Hora Primary Care Abisai Coble: [name] Other Clinician: Referring Romen Yutzy: [name] Treating Saahil Herbster/Extender: Tito Dine in Treatment: 0 Active Inactive ` Orientation to the Wound Care Program Nursing Diagnoses: Knowledge deficit related to the wound healing center program Goals: Patient/caregiver will verbalize understanding of the [place] Program Date Initiated: 01/22/2017 Target Resolution Date: 04/12/2017 Goal Status: Active Interventions: Provide education on orientation to the wound center Notes: ` Venous Leg Ulcer Nursing Diagnoses: Actual venous Insuffiency (use after diagnosis is confirmed) Goals: Patient will maintain optimal edema control Date Initiated: 01/22/2017 Target Resolution Date: 04/12/2017 Goal Status: Active Interventions: Assess peripheral edema status every visit. Notes: ` Wound/Skin Impairment Nursing Diagnoses: Impaired tissue integrity [name], [name] ([id number]) Goals: Ulcer/skin breakdown will have  a volume reduction of 30% by week 4 Date Initiated: 01/22/2017 Target Resolution Date: 04/12/2017 Goal Status: Active Ulcer/skin breakdown will have a volume reduction of 50% by week 8 Date Initiated: 01/22/2017 Target Resolution Date: 04/12/2017 Goal Status: Active Ulcer/skin breakdown will have a volume reduction of 80% by week 12 Date Initiated: 01/22/2017 Target Resolution Date: 04/12/2017 Goal Status: Active Ulcer/skin breakdown will heal within 14 weeks Date Initiated: 01/22/2017 Target Resolution Date: 04/12/2017 Goal Status: Active Interventions: Assess patient/caregiver ability to obtain necessary supplies Assess patient/caregiver ability to perform ulcer/skin care regimen upon admission and as needed Assess ulceration(s) every visit Notes: Electronic Signature(s) Signed: 01/22/2017 4:10:27 PM By: Montey Hora Entered By: Montey Hora on 01/22/2017 15:09:55 Mule, [name] ([id number]) -------------------------------------------------------------------------------- Pain Assessment Details Patient Name: [name], [name]. Date of Service: 01/22/2017 2:30 PM Medical Record Number: [id number] Patient Account Number: [id number] Date of Birth/Sex: 01-06-1946 (71 y.o. Female) Treating RN: Montey Hora Primary Care Rachella Basden: [name] Other Clinician: Referring Rishaan Gunner: [name] Treating Abdul Beirne/Extender: Tito Dine in Treatment: 0 Active Problems Location of Pain Severity and Description of Pain Patient Has Paino No Site Locations Pain Management and Medication Current Pain Management: Notes Topical or injectable lidocaine is offered to patient for acute pain when surgical debridement is performed. If needed, Patient is instructed to use over the counter pain medication for the following 24-48 hours after debridement. Wound care MDs do not prescribed pain medications. Patient has chronic pain or uncontrolled pain. Patient has been instructed to  make an appointment with their Primary Care Physician for pain management. Electronic Signature(s) Signed: 01/22/2017 4:10:27 PM  By: Montey Hora Entered By: Montey Hora on 01/22/2017 14:41:37 Maeder, [name] ([id number]) -------------------------------------------------------------------------------- Patient/Caregiver Education Details Patient Name: [name], [name]. Date of Service: 01/22/2017 2:30 PM Medical Record Patient Account Number: [id number] Number: Treating RN: Montey Hora 1946-08-26 (70 y.o. Other Clinician: Date of Birth/Gender: Female) Treating ROBSON, MICHAEL Primary Care Physician: [name] Physician/Extender: G Referring Physician: Donzetta Sprung in Treatment: 0 Education Assessment Education Provided To: Patient Education Topics Provided Venous: Handouts: Other: need for 20-49mmhg compression for both legs Electronic Signature(s) Signed: 01/22/2017 4:10:27 PM By: Montey Hora Entered By: Montey Hora on 01/22/2017 15:12:42 Mayhan, [name] ([id number]) -------------------------------------------------------------------------------- Wound Assessment Details Patient Name: [name], [name]. Date of Service: 01/22/2017 2:30 PM Medical Record Number: [id number] Patient Account Number: [id number] Date of Birth/Sex: [birth year] (71 y.o. Female) Treating RN: Montey Hora Primary Care Ayvion Kavanagh: [name] Other Clinician: Referring Helyne Genther: [name] Treating Milla Wahlberg/Extender: Tito Dine in Treatment: 0 Wound Status Wound Number: 3 Primary Venous Leg Ulcer Etiology: Wound Location: Left Lower Leg - Posterior Wound Open Wounding Event: Blister Status: Date Acquired: 01/22/2017 Comorbid Cataracts, Asthma, Sleep Apnea, Deep Weeks Of Treatment: 0 History: Vein Thrombosis, Hypertension, Clustered Wound: No Peripheral Venous Disease, Osteoarthritis, Received Chemotherapy, Received Radiation Wound  Measurements Length: (cm) 0.2 Width: (cm) 0.2 Depth: (cm) 0.1 Area: (cm) 0.031 Volume: (cm) 0.003 % Reduction in Area: % Reduction in Volume: Epithelialization: Small (1-33%) Tunneling: No Undermining: No Wound Description Classification: Partial Thickness Foul Odor Afte Wound Margin: Flat and Intact Slough/Fibrino Exudate Amount: Large Exudate Type: Serous Exudate Color: amber r Cleansing: No No Wound Bed Granulation Amount: Large (67-100%) Exposed Structure Granulation Quality: Pink Fascia Exposed: No Necrotic Amount: None Present (0%) Fat Layer (Subcutaneous Tissue) Exposed: No Tendon Exposed: No Muscle Exposed: No Joint Exposed: No Bone Exposed: No Limited to Skin Breakdown Periwound Skin Texture Texture Color No Abnormalities Noted: No No Abnormalities Noted: No Callus: No Atrophie Blanche: No [name], [name] ([id number]) Crepitus: No Cyanosis: No Excoriation: No Ecchymosis: No Induration: No Erythema: No Rash: No Hemosiderin Staining: No Scarring: No Mottled: No Pallor: No Moisture Rubor: No No Abnormalities Noted: No Dry / Scaly: No Temperature / Pain Maceration: No Temperature: No Abnormality Wound Preparation Ulcer Cleansing: Rinsed/Irrigated with Saline Topical Anesthetic Applied: None Treatment Notes Wound #3 (Left, Posterior Lower Leg) 1. Cleansed with: Clean wound with Normal Saline 4. Dressing Applied: Aquacel Ag 7. Secured with Tape 3 Layer Compression System - Left Lower Extremity Electronic Signature(s) Signed: 01/22/2017 4:10:27 PM By: Montey Hora Entered By: Montey Hora on 01/22/2017 14:59:17 [name], [name] ([id number]) -------------------------------------------------------------------------------- Vitals Details Patient Name: [name], [name]. Date of Service: 01/22/2017 2:30 PM Medical Record Number: [id number] Patient Account Number: [id number] Date of Birth/Sex: January 24, 1946 (71 y.o. Female) Treating RN: Montey Hora Primary Care Jerrine Urschel: [name] Other Clinician: Referring Shalona Harbour: [name] Treating Johnisha Louks/Extender: Tito Dine in Treatment: 0 Vital Signs Time Taken: 14:41 Temperature (F): 97.5 Height (in): 63 Pulse (bpm): 70 Source: Measured Respiratory Rate (breaths/min): 18 Weight (lbs): 218 Blood Pressure (mmHg): 120/70 Source: Measured Reference Range: 80 - 120 mg / dl Body Mass Index (BMI): 38.6 Electronic Signature(s) Signed: 01/22/2017 4:10:27 PM By: Montey Hora Entered By: Montey Hora on 01/22/2017 14:43:27

## 2017-01-23 NOTE — Progress Notes (Signed)
[name], [name] ([id number]) Visit Report for 01/22/2017 Chief Complaint Document Details Patient Name: [name], [name]. Date of Service: 01/22/2017 2:30 PM Medical Record Patient Account Number: [id number] Number: Treating RN: [name] [birth year] (71 y.o. Other Clinician: Date of Birth/Sex: Female) Treating [name] Primary Care Provider: Ria [name] Provider/Extender: G Referring Provider: Donzetta [name] in Treatment: 0 Information Obtained from: Patient Chief Complaint Left calf venous stasis ulceration. 11/13/16; the patient is here for a left calf recurrent ulceration which is painful and has been present for the last month 01/22/17; the patient re-presents here for recurrent difficulties with blistering and leaking fluid on her left posterior calf Electronic Signature(s) Signed: 01/22/2017 4:28:03 PM By: Amber Ham MD Entered By: [name] on 01/22/2017 15:48:30 [name], Amber Child ([id number]) -------------------------------------------------------------------------------- HPI Details Patient Name: [name], [name]. Date of Service: 01/22/2017 2:30 PM Medical Record Patient Account Number: [id number] Number: Treating RN: [name] [birth year] (71 y.o. Other Clinician: Date of Birth/Sex: Female) Treating [name] Primary Care Provider: Ria [name] Provider/Extender: G Referring Provider: Donzetta [name] in Treatment: 0 History of Present Illness HPI Description: Pleasant 71 year old with history of chronic venous insufficiency. No diabetes or peripheral vascular disease. Left ABI 1.29. Questionable history of left lower extremity DVT. She developed a recurrent ulceration on her left lateral calf in December 2015, which she attributes to poor diet and subsequent lower extremity edema. She underwent endovenous laser ablation of her left greater saphenous vein in 2010. She underwent laser ablation of  accessory branch of left GSV in April 2016 by Dr. Kellie [name] at [facility]. She was previously wearing Unna boots, which she tolerated well. Tolerating 2 layer compression and cadexomer iodine. She returns to clinic for follow-up and is without new complaints. She denies any significant pain at this time. She reports persistent pain with pressure. No claudication or ischemic rest pain. No fever or chills. No drainage. READMISSION 11/13/16; this is a 71 year old woman who is not a diabetic. She is here for a review of a painful area on her left medial lower extremity. I note that she was seen here previously last year for wound I believe to be in the same area. At that time she had undergone previously a left greater saphenous vein ablation by Dr. Kellie [name] and she had a ablation of the anterior accessory branch of the left greater saphenous vein in March 2016. Seeing that the wound actually closed over. In reviewing the history with her today the ulcer in this area has been recurrent. She describes a biopsy of this area in 2009 that only showed stasis physiology. 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 Do not see that she has ever had arterial studies in the left leg. She tells me she has a follow-up with Dr. Kellie [name] in roughly 10 days In any case she developed the reopening of this area roughly a month ago. On the background of this she describes rapidly increasing edema which has responded to Lasix 40 mg and metolazone 2.5 mg as well as the patient's lymph massage. She has been told she has both venous insufficiency and lymphedema but she cannot tolerate compression stockings 11/28/16; the patient saw Dr. Kellie [name] recently. Per the patient he did arterial Dopplers in the office that  did not show evidence of arterial insufficiency, per the patient he stated "treat this like an ordinary venous ulcer". She also saw her dermatologist Dr. Ronnald [name] who felt that this was more of a vascular ulcer. In general things are improving although she arrives today with increasing bilateral lower extremity edema with weeping a deeper fluid through the wound on the left medial leg compatible with some degree of lymphedema 12/04/16; the patient's wound is fully epithelialized but I don't think fully healed. We will do another week of depression with Promogran and TCA however I suspect we'll be able to discharge her next week. This is a very unusual-looking wound which was initially a figure-of-eight type wound lying on its side surrounded by [name] ([id number]) petechial like hemorrhage. She has had venous ablation on this side. She apparently does not have an arterial issue per Dr. Kellie [name]. She saw her dermatologist thought it was "vascular". Patient is definitely going to need ongoing compression and I talked about this with her today she will go to elastic therapy after she leaves here next week 12/11/16; the patient's wound is not completely closed today. She has surrounding scar tissue and in further discussion with the patient it would appear that she had ulcers in this area in 2009 for a prolonged period of time ultimately requiring a punch biopsy of this area that only showed venous insufficiency. I did not previously pickup on this part of the history from the patient. 12/18/16; the patient's wound is completely epithelialized. There is no open area here. She has significant bilateral venous insufficiency with secondary lymphedema to a mild-to-moderate degree she does not have compression stockings.. She did not say anything to me when I was in the room, she told our intake nurse that she was still having pain in this area. This isn't unusual recurrent small open area. She is going  to go to elastic therapy to obtain compression stockings. 12/25/16; the patient's wound is fully epithelialized. There is no open area here. The patient describes some continued episodic discomfort in this area medial left calf. However everything looks fine and healed here. She is been to elastic therapy and caught herself 15-20 mmHg stockings, they apparently were having trouble getting 20-30 mm stockings in her size 01/22/17; this is a patient we discharged from the clinic a month ago. She has a recurrent open wound on her medial left calf. She had 15 mm support stockings. I told her I thought she needed 20-30 mm compression stockings. She tells me that she has been ill with hospitalization secondary to asthma and is been found to have severe hypokalemia likely secondary to a combination of Lasix and metolazone. This morning she noted blistering and leaking fluid on the posterior part of her left leg. She called our intake nurse urgently and we was saw her this afternoon. She has not had any real discomfort here. I don't know that she's been wearing any stockings on this leg for at least 2-3 days. ABIs in this clinic were 1.21 on the right and 1.3 on the left. She is previously seen vascular surgery who does not think that there is a peripheral arterial issue. Electronic Signature(s) Signed: 01/22/2017 4:28:03 PM By: Amber Ham MD Entered By: [name] on 01/22/2017 15:54:14 Mcclafferty, Amber Child ([id number]) -------------------------------------------------------------------------------- Physical Exam Details Patient Name: [name], [name]. Date of Service: 01/22/2017 2:30 PM Medical Record Patient Account Number: [id number] Number: Treating RN: [name] [birth year] (70 y.o. Other Clinician: Date of Birth/Sex:  Female) Treating Patrick Salemi Primary Care Provider: Ria [name] Provider/Extender: G Referring Provider: Donzetta [name] in Treatment:  0 Constitutional Sitting or standing Blood Pressure is within target range for patient.. Pulse regular and within target range for patient.Marland Kitchen Respirations regular, non-labored and within target range.. Temperature is normal and within the target range for the patient.. Patient's appearance is neat and clean. Appears in no acute distress. Well nourished and well developed.. Eyes Conjunctivae clear. No discharge.. Ears, Nose, Mouth, and Throat Oropharynx within normal limits, without erythema, exudate or ulceration.Marland Kitchen Respiratory Respiratory effort is easy and symmetric bilaterally. Rate is normal at rest and on room air.. Bilateral breath sounds are clear and equal in all lobes with no wheezes, rales or rhonchi.. Cardiovascular Heart rhythm and rate regular, without murmur or gallop. No evidence of CHF. Pedal pulses palpable and strong bilaterally.. Edema present in both extremities. This is pitting at least somewhat pitting. I think this is a combination of chronic venous insufficiency and secondary lymphedema. Lymphatic None palpable in the popliteal ring on all area. Integumentary (Hair, Skin) Skin and subcutaneous tissue without rashes, lesions, discoloration or ulcers. No evidence of fungal disease. Hair and skin color texture normal and healthy in appearance.Marland Kitchen Psychiatric No evidence of depression, anxiety, or agitation. Calm, cooperative, and communicative. Appropriate interactions and affect.. Notes Wound exam; the area we dealt with on her left medial calf last month is still closed with healthy epithelialization, some scar tissue. The area she is concerned about is on the posterior left. She has a small area with 2 small blisters. There is really technically no open wound here however there is some weeping edema and the edema in the left leg is poorly controlled. There is no evidence of surrounding infection Electronic Signature(s) Signed: 01/22/2017 4:28:03 PM By: Amber Ham MD Entered By: [name] on 01/22/2017 15:53:20 Budney, [name] ([id number]) [name], [name] ([id number]) -------------------------------------------------------------------------------- Physician Orders Details Patient Name: [name], [name]. Date of Service: 01/22/2017 2:30 PM Medical Record Patient Account Number: [id number] Number: Treating RN: [name] [birth year] (70 y.o. Other Clinician: Date of Birth/Sex: Female) Treating Anothony Bursch Primary Care Provider: Ria [name] Provider/Extender: G Referring Provider: Donzetta [name] in Treatment: 0 Verbal / Phone Orders: No Diagnosis Coding Wound Cleansing Wound #3 Left,Posterior Lower Leg o Clean wound with Normal Saline. Primary Wound Dressing Wound #3 Left,Posterior Lower Leg o Aquacel Ag Dressing Change Frequency Wound #3 Left,Posterior Lower Leg o Change dressing every week Follow-up Appointments Wound #3 Left,Posterior Lower Leg o Return Appointment in 1 week. Edema Control Wound #3 Left,Posterior Lower Leg o 3 Layer Compression System - Left Lower Extremity o Patient to wear own compression stockings - RLE Electronic Signature(s) Signed: 01/22/2017 4:10:27 PM By: [name] Signed: 01/22/2017 4:28:03 PM By: Amber Ham MD Entered By: [name] on 01/22/2017 15:11:08 [name], [name] ([id number]) -------------------------------------------------------------------------------- Problem List Details Patient Name: [name], [name]. Date of Service: 01/22/2017 2:30 PM Medical Record Patient Account Number: [id number] Number: Treating RN: [name] 10/05/1946 (70 y.o. Other Clinician: Date of Birth/Sex: Female) Treating Andria Head Primary Care Provider: Ria [name] Provider/Extender: G Referring Provider: Donzetta [name] in Treatment: 0 Active Problems ICD-10 Encounter Code Description Active Date Diagnosis L97.228  Non-pressure chronic ulcer of left calf with other specified 01/22/2017 Yes severity I89.0 Lymphedema, not elsewhere classified 01/22/2017 Yes I87.2 Venous insufficiency (chronic) (peripheral) 01/22/2017 Yes Inactive Problems Resolved Problems Electronic Signature(s) Signed: 01/22/2017 4:28:03 PM By: Amber Ham MD Entered By: [name] on  01/22/2017 15:47:56 [name], [name] ([id number]) -------------------------------------------------------------------------------- Progress Note Details Patient Name: [name], [name]. Date of Service: 01/22/2017 2:30 PM Medical Record Patient Account Number: [id number] Number: Treating RN: [name] Mar 06, 1946 (70 y.o. Other Clinician: Date of Birth/Sex: Female) Treating Moncerrat Burnstein Primary Care Provider: Ria [name] Provider/Extender: G Referring Provider: Donzetta [name] in Treatment: 0 Subjective Chief Complaint Information obtained from Patient Left calf venous stasis ulceration. 11/13/16; the patient is here for a left calf recurrent ulceration which is painful and has been present for the last month 01/22/17; the patient re-presents here for recurrent difficulties with blistering and leaking fluid on her left posterior calf History of Present Illness (HPI) Pleasant 71 year old with history of chronic venous insufficiency. No diabetes or peripheral vascular disease. Left ABI 1.29. Questionable history of left lower extremity DVT. She developed a recurrent ulceration on her left lateral calf in December 2015, which she attributes to poor diet and subsequent lower extremity edema. She underwent endovenous laser ablation of her left greater saphenous vein in 2010. She underwent laser ablation of accessory branch of left GSV in April 2016 by Dr. Kellie [name] at [facility]. She was previously wearing Unna boots, which she tolerated well. Tolerating 2 layer compression and cadexomer iodine. She returns to clinic for  follow-up and is without new complaints. She denies any significant pain at this time. She reports persistent pain with pressure. No claudication or ischemic rest pain. No fever or chills. No drainage. READMISSION 11/13/16; this is a 71 year old woman who is not a diabetic. She is here for a review of a painful area on her left medial lower extremity. I note that she was seen here previously last year for wound I believe to be in the same area. At that time she had undergone previously a left greater saphenous vein ablation by Dr. Kellie [name] and she had a ablation of the anterior accessory branch of the left greater saphenous vein in March 2016. Seeing that the wound actually closed over. In reviewing the history with her today the ulcer in this area has been recurrent. She describes a biopsy of this area in 2009 that only showed stasis physiology. 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 Do not see that she has ever had arterial studies in the left leg. She tells me she has a follow-up with Dr. Kellie [name] in roughly 10 days In any case she developed the reopening of this area roughly a month ago. On the background of this she describes rapidly increasing edema which has responded to Lasix 40 mg and metolazone 2.5 mg as well as the patient's lymph massage. She has been told she has both venous insufficiency and lymphedema but she cannot tolerate compression stockings 11/28/16; the patient saw Dr. Kellie [name] recently. Per the patient he did arterial Dopplers in the office that did CINDIA, HUSTEAD. ([id number]) not show evidence of arterial insufficiency, per the patient he stated "treat this like an ordinary venous ulcer". She also saw her dermatologist Dr. Ronnald [name] who felt that this was more of  a vascular ulcer. In general things are improving although she arrives today with increasing bilateral lower extremity edema with weeping a deeper fluid through the wound on the left medial leg compatible with some degree of lymphedema 12/04/16; the  patient's wound is fully epithelialized but I don't think fully healed. We will do another week of depression with Promogran and TCA however I suspect we'll be able to discharge her next week. This is a very unusual-looking wound which was initially a figure-of-eight type wound lying on its side surrounded by petechial like hemorrhage. She has had venous ablation on this side. She apparently does not have an arterial issue per Dr. Kellie [name]. She saw her dermatologist thought it was "vascular". Patient is definitely going to need ongoing compression and I talked about this with her today she will go to elastic therapy after she leaves here next week 12/11/16; the patient's wound is not completely closed today. She has surrounding scar tissue and in further discussion with the patient it would appear that she had ulcers in this area in 2009 for a prolonged period of time ultimately requiring a punch biopsy of this area that only showed venous insufficiency. I did not previously pickup on this part of the history from the patient. 12/18/16; the patient's wound is completely epithelialized. There is no open area here. She has significant bilateral venous insufficiency with secondary lymphedema to a mild-to-moderate degree she does not have compression stockings.. She did not say anything to me when I was in the room, she told our intake nurse that she was still having pain in this area. This isn't unusual recurrent small open area. She is going to go to elastic therapy to obtain compression stockings. 12/25/16; the patient's wound is fully epithelialized. There is no open area here. The patient describes some continued episodic discomfort in this area medial  left calf. However everything looks fine and healed here. She is been to elastic therapy and caught herself 15-20 mmHg stockings, they apparently were having trouble getting 20-30 mm stockings in her size 01/22/17; this is a patient we discharged from the clinic a month ago. She has a recurrent open wound on her medial left calf. She had 15 mm support stockings. I told her I thought she needed 20-30 mm compression stockings. She tells me that she has been ill with hospitalization secondary to asthma and is been found to have severe hypokalemia likely secondary to a combination of Lasix and metolazone. This morning she noted blistering and leaking fluid on the posterior part of her left leg. She called our intake nurse urgently and we was saw her this afternoon. She has not had any real discomfort here. I don't know that she's been wearing any stockings on this leg for at least 2-3 days. ABIs in this clinic were 1.21 on the right and 1.3 on the left. She is previously seen vascular surgery who does not think that there is a peripheral arterial issue. Wound History Patient presents with 1 open wound that has been present for approximately 1 day. Patient has been treating wound in the following manner: open to air. Laboratory tests have not been performed in the last month. Patient reportedly has not tested positive for an antibiotic resistant organism. Patient reportedly has not tested positive for osteomyelitis. Patient reportedly has had testing performed to evaluate circulation in the legs. Patient experiences the following problems associated with their wounds: swelling. Patient History Information obtained from Patient. Allergies Sulfa (Sulfonamide Antibiotics) (Reaction: facial swelling) Family History SHAIA, PORATH ([id number]) Cancer - Mother, Paternal Grandparents, Hypertension - Mother, Father, Kidney Disease - Paternal Grandparents, Lung Disease - Paternal Grandparents, No family  history of Diabetes, Heart Disease, Hereditary Spherocytosis, Seizures, Stroke, Thyroid Problems, Tuberculosis.  Social History Former smoker - quit at age 31, Marital Status - Divorced, Alcohol Use - Never, Drug Use - No History, Caffeine Use - Daily. Medical History Eyes Patient has history of Cataracts Denies history of Glaucoma, Optic Neuritis Ear/Nose/Mouth/Throat Denies history of Chronic sinus problems/congestion, Middle ear problems Hematologic/Lymphatic Denies history of Anemia, Hemophilia, Human Immunodeficiency Virus, Lymphedema, Sickle Cell Disease Respiratory Patient has history of Asthma, Sleep Apnea Denies history of Aspiration, Chronic Obstructive Pulmonary Disease (COPD), Pneumothorax, Tuberculosis Cardiovascular Patient has history of Deep Vein Thrombosis - LLE 2010, Hypertension, Peripheral Venous Disease Denies history of Angina, Arrhythmia, Congestive Heart Failure, Coronary Artery Disease, Hypotension, Myocardial Infarction, Peripheral Arterial Disease, Phlebitis, Vasculitis Gastrointestinal Denies history of Cirrhosis , Colitis, Crohn s, Hepatitis A, Hepatitis B, Hepatitis C Endocrine Denies history of Type I Diabetes, Type II Diabetes Genitourinary Denies history of End Stage Renal Disease Immunological Denies history of Lupus Erythematosus, Raynaud s, Scleroderma Integumentary (Skin) Denies history of History of Burn, History of pressure wounds Musculoskeletal Patient has history of Osteoarthritis - neck Denies history of Gout, Rheumatoid Arthritis, Osteomyelitis Neurologic Denies history of Dementia, Neuropathy, Quadriplegia, Paraplegia, Seizure Disorder Oncologic Patient has history of Received Chemotherapy - interfeon immunotherapy, Received Radiation Psychiatric Denies history of Anorexia/bulimia, Confinement Anxiety Hospitalization/Surgery History - 08/05/2010, [facility], Melanoma sx. Medical And Surgical History Notes Constitutional Symptoms (General  Health) Vein ablation LLE 2010 Vascular Sx ( vein ablationo) LLE 02/2015 Eyes Horners syndrome [name], [name] ([id number]) Genitourinary Kidney stones Neurologic ct scan showed swollen lymph nodes Oncologic Melanoma (R) shoulder 07/2010; (R) axillary lymphnode removal Objective Constitutional Sitting or standing Blood Pressure is within target range for patient.. Pulse regular and within target range for patient.Marland Kitchen Respirations regular, non-labored and within target range.. Temperature is normal and within the target range for the patient.. Patient's appearance is neat and clean. Appears in no acute distress. Well nourished and well developed.. Vitals Time Taken: 2:41 PM, Height: 63 in, Source: Measured, Weight: 218 lbs, Source: Measured, BMI: 38.6, Temperature: 97.5 F, Pulse: 70 bpm, Respiratory Rate: 18 breaths/min, Blood Pressure: 120/70 mmHg. Eyes Conjunctivae clear. No discharge.. Ears, Nose, Mouth, and Throat Oropharynx within normal limits, without erythema, exudate or ulceration.Marland Kitchen Respiratory Respiratory effort is easy and symmetric bilaterally. Rate is normal at rest and on room air.. Bilateral breath sounds are clear and equal in all lobes with no wheezes, rales or rhonchi.. Cardiovascular Heart rhythm and rate regular, without murmur or gallop. No evidence of CHF. Pedal pulses palpable and strong bilaterally.. Edema present in both extremities. This is pitting at least somewhat pitting. I think this is a combination of chronic venous insufficiency and secondary lymphedema. Lymphatic None palpable in the popliteal ring on all area. Psychiatric No evidence of depression, anxiety, or agitation. Calm, cooperative, and communicative. Appropriate interactions and affect.Marland Kitchen AMYLA, HEFFNER ([id number]) General Notes: Wound exam; the area we dealt with on her left medial calf last month is still closed with healthy epithelialization, some scar tissue. The area she is  concerned about is on the posterior left. She has a small area with 2 small blisters. There is really technically no open wound here however there is some weeping edema and the edema in the left leg is poorly controlled. There is no evidence of surrounding infection Integumentary (Hair, Skin) Skin and subcutaneous tissue without rashes, lesions, discoloration or ulcers. No evidence of fungal disease. Hair and skin color texture normal and healthy in appearance.. Wound #3 status is Open. Original cause of wound was Blister. The wound is  located on the Left,Posterior Lower Leg. The wound measures 0.2cm length x 0.2cm width x 0.1cm depth; 0.031cm^2 area and 0.003cm^3 volume. The wound is limited to skin breakdown. There is no tunneling or undermining noted. There is a large amount of serous drainage noted. The wound margin is flat and intact. There is large (67- 100%) pink granulation within the wound bed. There is no necrotic tissue within the wound bed. The periwound skin appearance did not exhibit: Callus, Crepitus, Excoriation, Induration, Rash, Scarring, Dry/Scaly, Maceration, Atrophie Blanche, Cyanosis, Ecchymosis, Hemosiderin Staining, Mottled, Pallor, Rubor, Erythema. Periwound temperature was noted as No Abnormality. 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 Primary Wound Dressing: Wound #3 Left,Posterior Lower Leg: Aquacel Ag Dressing Change Frequency: Wound #3 Left,Posterior Lower Leg: Change dressing every week Follow-up Appointments: Wound #3 Left,Posterior Lower Leg: Return Appointment in 1 week. TERAH, ROBEY ([id number]) Edema Control: Wound #3 Left,Posterior Lower Leg: 3 Layer Compression System - Left Lower  Extremity Patient to wear own compression stockings - RLE #1 we put Aquacel Ag on the posterior left Blistered area. In view of preventing further damage to this I elected to go ahead and wrap her with a Profore light wrap. I've asked her to bring her stockings in next week. I have also reiterated to her that I think she needs 20-30 mm compression stockings. She has a sock Donner but even so has some difficulty here. She apparently had an allergic reaction to juxtalite stockings in the past therefore they are not an option. According to our intake nurse Juzo stockings have similar material and therefore likely not to be a consideration in this patient. She asked me about external compression pumps however I think we would have to show that a months worth of compression did not change her measurements in the setting of her lymphedema #2 her lungs sound clear, she tells me she's had recent issues with asthma she is not wheezing. There is no evidence of fluid volume overload #3 also tells me that her potassium was measured at 2.4 at follow-up from her oncologist 2 weeks ago. This is profound total body potassium loss. She is having this rechecked tomorrow Electronic Signature(s) Signed: 01/22/2017 4:28:03 PM By: Amber Ham MD Entered By: [name] on 01/22/2017 15:58:09 SONORA, CATLIN ([id number]) -------------------------------------------------------------------------------- ROS/PFSH Details Patient Name: GENEIEVE, DUELL. Date of Service: 01/22/2017 2:30 PM Medical Record Patient Account Number: [id number] Number: Treating RN: [name] 15-Sep-1946 (70 y.o. Other Clinician: Date of Birth/Sex: Female) Treating Mickey Esguerra, [place] Primary Care Provider: Ria [name] Provider/Extender: G Referring Provider: Donzetta [name] in Treatment: 0 Label Progress Note Print Version as History and Physical for this encounter Information Obtained From Patient Wound  History Do you currently have one or more open woundso Yes How many open wounds do you currently haveo 1 Approximately how long have you had your woundso 1 day How have you been treating your wound(s) until nowo open to air Has your wound(s) ever healed and then re-openedo No Have you had any lab work done in the past montho No Have you tested positive for an antibiotic resistant organism (MRSA, VRE)o No Have you tested positive for osteomyelitis (bone infection)o No Have you had any tests for circulation on your legso Yes Who ordered the testo [name] Where was  the test doneo [facility] Have you had other problems associated with your woundso Swelling Constitutional Symptoms (General Health) Medical History: Past Medical History Notes: Vein ablation LLE 2010 Vascular Sx ( vein ablationo) LLE 02/2015 Eyes Medical History: Positive for: Cataracts Negative for: Glaucoma; Optic Neuritis Past Medical History Notes: Horners syndrome Ear/Nose/Mouth/Throat Medical History: Negative for: Chronic sinus problems/congestion; Middle ear problems Hematologic/Lymphatic MAIE, KESINGER ([id number]) Medical History: Negative for: Anemia; Hemophilia; Human Immunodeficiency Virus; Lymphedema; Sickle Cell Disease Respiratory Medical History: Positive for: Asthma; Sleep Apnea Negative for: Aspiration; Chronic Obstructive Pulmonary Disease (COPD); Pneumothorax; Tuberculosis Cardiovascular Medical History: Positive for: Deep Vein Thrombosis - LLE 2010; Hypertension; Peripheral Venous Disease Negative for: Angina; Arrhythmia; Congestive Heart Failure; Coronary Artery Disease; Hypotension; Myocardial Infarction; Peripheral Arterial Disease; Phlebitis; Vasculitis Gastrointestinal Medical History: Negative for: Cirrhosis ; Colitis; Crohnos; Hepatitis A; Hepatitis B; Hepatitis C Endocrine Medical History: Negative for: Type I Diabetes; Type II Diabetes Genitourinary Medical History: Negative for: End  Stage Renal Disease Past Medical History Notes: Kidney stones Immunological Medical History: Negative for: Lupus Erythematosus; Raynaudos; Scleroderma Integumentary (Skin) Medical History: Negative for: History of Burn; History of pressure wounds Musculoskeletal Medical History: Positive for: Osteoarthritis - neck Negative for: Gout; Rheumatoid Arthritis; Osteomyelitis Neurologic Medical History: Negative for: Dementia; Neuropathy; Quadriplegia; Paraplegia; Seizure Disorder SEDONIA, KITNER ([id number]) Past Medical History Notes: ct scan showed swollen lymph nodes Oncologic Medical History: Positive for: Received Chemotherapy - interfeon immunotherapy; Received Radiation Past Medical History Notes: Melanoma (R) shoulder 07/2010; (R) axillary lymphnode removal Psychiatric Medical History: Negative for: Anorexia/bulimia; Confinement Anxiety HBO Extended History Items Eyes: Cataracts Immunizations Pneumococcal Vaccine: Received Pneumococcal Vaccination: Yes Immunization Notes: tetanus shot w/in the last 5 years per pt Hospitalization / Surgery History Name of Hospital Purpose of Hospitalization/Surgery Date [facility] Melanoma sx 08/05/2010 Family and Social History Cancer: Yes - Mother, Paternal Grandparents; Diabetes: No; Heart Disease: No; Hereditary Spherocytosis: No; Hypertension: Yes - Mother, Father; Kidney Disease: Yes - Paternal Grandparents; Lung Disease: Yes - Paternal Grandparents; Seizures: No; Stroke: No; Thyroid Problems: No; Tuberculosis: No; Former smoker - quit at age 89; Marital Status - Divorced; Alcohol Use: Never; Drug Use: No History; Caffeine Use: Daily; Advanced Directives: No; Patient does not want information on Advanced Directives; Do not resuscitate: No; Living Will: No; Medical Power of Attorney: No Electronic Signature(s) Signed: 01/22/2017 4:10:27 PM By: [name] Signed: 01/22/2017 4:28:03 PM By: Amber Ham MD Entered By: [name]  on 01/22/2017 14:45:12 RIFKA, RAMEY ([id number]) -------------------------------------------------------------------------------- SuperBill Details Patient Name: HARSHITHA, [name]. Date of Service: 01/22/2017 Medical Record Patient Account Number: [id number] Number: Treating RN: [name] [birth year] (70 y.o. Other Clinician: Date of Birth/Sex: Female) Treating Quanika Solem Primary Care Provider: Ria [name] Provider/Extender: G Referring Provider: Ria [name] Service Line: Outpatient Weeks in Treatment: 0 Diagnosis Coding ICD-10 Codes Code Description L97.228 Non-pressure chronic ulcer of left calf with other specified severity I89.0 Lymphedema, not elsewhere classified I87.2 Venous insufficiency (chronic) (peripheral) Facility Procedures CPT4: Description Modifier Quantity Code [id number] - WOUND CARE VISIT-LEV 3 EST PT 1 CPT4: 90240973 (Facility Use Only) [id number] - [place] [facility] LWR LT 1 LEG Physician Procedures CPT4 Code Description: [id number] 99214 - WC PHYS LEVEL 4 - EST PT ICD-10 Description Diagnosis L97.228 Non-pressure chronic ulcer of left calf with other s I89.0 Lymphedema, not elsewhere classified Modifier: pecified severi Quantity: 1 ty Electronic Signature(s) Signed: 01/22/2017 4:28:03 PM By: Amber Ham MD Entered By: [name] on 01/22/2017 15:57:02

## 2017-01-24 ENCOUNTER — Other Ambulatory Visit: Payer: Self-pay | Admitting: *Deleted

## 2017-01-24 ENCOUNTER — Telehealth: Payer: Self-pay | Admitting: *Deleted

## 2017-01-24 ENCOUNTER — Other Ambulatory Visit (HOSPITAL_BASED_OUTPATIENT_CLINIC_OR_DEPARTMENT_OTHER): Payer: Medicare Other

## 2017-01-24 DIAGNOSIS — C4361 Malignant melanoma of right upper limb, including shoulder: Secondary | ICD-10-CM

## 2017-01-24 DIAGNOSIS — C4310 Malignant melanoma of unspecified eyelid, including canthus: Secondary | ICD-10-CM

## 2017-01-24 LAB — CMP (CANCER CENTER ONLY)
ALT(SGPT): 27 U/L (ref 10–47)
AST: 19 U/L (ref 11–38)
Albumin: 3.7 g/dL (ref 3.3–5.5)
Alkaline Phosphatase: 65 U/L (ref 26–84)
BUN, Bld: 25 mg/dL — ABNORMAL HIGH (ref 7–22)
CO2: 33 mEq/L (ref 18–33)
Calcium: 9.9 mg/dL (ref 8.0–10.3)
Chloride: 97 mEq/L — ABNORMAL LOW (ref 98–108)
Creat: 1.1 mg/dl (ref 0.6–1.2)
Glucose, Bld: 106 mg/dL (ref 73–118)
Potassium: 3 mEq/L — CL (ref 3.3–4.7)
Sodium: 142 mEq/L (ref 128–145)
Total Bilirubin: 0.6 mg/dl (ref 0.20–1.60)
Total Protein: 6.9 g/dL (ref 6.4–8.1)

## 2017-01-24 MED ORDER — POTASSIUM CHLORIDE ER 10 MEQ PO TBCR: 10.0000 meq | EXTENDED_RELEASE_TABLET | Freq: Every day | ORAL | 3 refills | Status: DC

## 2017-01-24 NOTE — Telephone Encounter (Signed)
Patient potassium 3.0 which is higher than it was 01/09/17.  Dr. Marin Olp wants patient to take half of her potassium.  She needs to take 20 meq daily at this point.  New Rx sent in to pharmacy

## 2017-01-27 ENCOUNTER — Other Ambulatory Visit: Payer: Self-pay | Admitting: Family Medicine

## 2017-01-29 DIAGNOSIS — H35373 Puckering of macula, bilateral: Secondary | ICD-10-CM | POA: Diagnosis not present

## 2017-01-29 DIAGNOSIS — D487 Neoplasm of uncertain behavior of other specified sites: Secondary | ICD-10-CM | POA: Diagnosis not present

## 2017-01-29 DIAGNOSIS — H35362 Drusen (degenerative) of macula, left eye: Secondary | ICD-10-CM | POA: Diagnosis not present

## 2017-01-30 ENCOUNTER — Encounter: Payer: Medicare Other | Admitting: Internal Medicine

## 2017-01-30 DIAGNOSIS — Z882 Allergy status to sulfonamides status: Secondary | ICD-10-CM | POA: Diagnosis not present

## 2017-01-30 DIAGNOSIS — L97229 Non-pressure chronic ulcer of left calf with unspecified severity: Secondary | ICD-10-CM | POA: Diagnosis not present

## 2017-01-30 DIAGNOSIS — Z8249 Family history of ischemic heart disease and other diseases of the circulatory system: Secondary | ICD-10-CM | POA: Diagnosis not present

## 2017-01-30 DIAGNOSIS — I872 Venous insufficiency (chronic) (peripheral): Secondary | ICD-10-CM | POA: Diagnosis not present

## 2017-01-30 DIAGNOSIS — L97228 Non-pressure chronic ulcer of left calf with other specified severity: Secondary | ICD-10-CM | POA: Diagnosis not present

## 2017-01-30 DIAGNOSIS — I890 Lymphedema, not elsewhere classified: Secondary | ICD-10-CM | POA: Diagnosis not present

## 2017-01-30 DIAGNOSIS — Z87891 Personal history of nicotine dependence: Secondary | ICD-10-CM | POA: Diagnosis not present

## 2017-01-31 ENCOUNTER — Telehealth: Payer: Self-pay | Admitting: Family Medicine

## 2017-01-31 NOTE — Telephone Encounter (Signed)
error 

## 2017-02-01 DIAGNOSIS — I872 Venous insufficiency (chronic) (peripheral): Secondary | ICD-10-CM | POA: Diagnosis not present

## 2017-02-01 DIAGNOSIS — I10 Essential (primary) hypertension: Secondary | ICD-10-CM | POA: Diagnosis not present

## 2017-02-01 DIAGNOSIS — M419 Scoliosis, unspecified: Secondary | ICD-10-CM | POA: Diagnosis not present

## 2017-02-01 DIAGNOSIS — I890 Lymphedema, not elsewhere classified: Secondary | ICD-10-CM | POA: Diagnosis not present

## 2017-02-01 DIAGNOSIS — M4713 Other spondylosis with myelopathy, cervicothoracic region: Secondary | ICD-10-CM | POA: Diagnosis not present

## 2017-02-01 DIAGNOSIS — M40203 Unspecified kyphosis, cervicothoracic region: Secondary | ICD-10-CM | POA: Diagnosis not present

## 2017-02-01 NOTE — Progress Notes (Signed)
CHRISTABELLE, HANZLIK ([id number]) Visit Report for 01/30/2017 Chief Complaint Document Details Patient Name: [name], [name]. Date of Service: 01/30/2017 12:30 PM Medical Record Patient Account Number: [id number] Number: Treating RN: Montey Hora 1946/08/14 (71 y.o. Other Clinician: Date of Birth/Sex: Female) Treating Satori Krabill Primary Care Provider: [name] Provider/Extender: G Referring Provider: Donzetta Sprung in Treatment: 1 Information Obtained from: Patient Chief Complaint Left calf venous stasis ulceration. 11/13/16; the patient is here for a left calf recurrent ulceration which is painful and has been present for the last month 01/22/17; the patient re-presents here for recurrent difficulties with blistering and leaking fluid on her left posterior calf Electronic Signature(s) Signed: 01/31/2017 5:50:35 AM By: Linton Ham MD Entered By: Linton Ham on 01/30/2017 13:01:15 [name] ([id number]) -------------------------------------------------------------------------------- HPI Details Patient Name: [name], [name]. Date of Service: 01/30/2017 12:30 PM Medical Record Patient Account Number: [id number] Number: Treating RN: Montey Hora 05/31/1946 (71 y.o. Other Clinician: Date of Birth/Sex: Female) Treating Erique Kaser Primary Care Provider: [name] Provider/Extender: G Referring Provider: Donzetta Sprung in Treatment: 1 History of Present Illness HPI Description: Pleasant 71 year old with history of chronic venous insufficiency. No diabetes or peripheral vascular disease. Left ABI 1.29. Questionable history of left lower extremity DVT. She developed a recurrent ulceration on her left lateral calf in December 2015, which she attributes to poor diet and subsequent lower extremity edema. She underwent endovenous laser ablation of her left greater saphenous vein in 2010. She underwent laser ablation of  accessory branch of left GSV in April 2016 by Dr. Kellie Simmering at [facility]. She was previously wearing Unna boots, which she tolerated well. Tolerating 2 layer compression and cadexomer iodine. She returns to clinic for follow-up and is without new complaints. She denies any significant pain at this time. She reports persistent pain with pressure. No claudication or ischemic rest pain. No fever or chills. No drainage. READMISSION 11/13/16; this is a 71 year old woman who is not a diabetic. She is here for a review of a painful area on her left medial lower extremity. I note that she was seen here previously last year for wound I believe to be in the same area. At that time she had undergone previously a left greater saphenous vein ablation by Dr. Kellie Simmering and she had a ablation of the anterior accessory branch of the left greater saphenous vein in March 2016. Seeing that the wound actually closed over. In reviewing the history with her today the ulcer in this area has been recurrent. She describes a biopsy of this area in 2009 that only showed stasis physiology. 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 Do not see that she has ever had arterial studies in the left leg. She tells me she has a follow-up with Dr. Kellie Simmering in roughly 10 days In any case she developed the reopening of this area roughly a month ago. On the background of this she describes rapidly increasing edema which has responded to Lasix 40 mg and metolazone 2.5 mg as well as the patient's lymph massage. She has been told she has both venous insufficiency and lymphedema but she cannot tolerate compression stockings 11/28/16; the patient saw Dr. Kellie Simmering recently. Per the patient he did arterial Dopplers in the office that  did not show evidence of arterial insufficiency, per the patient he stated "treat this like an ordinary venous ulcer". She also saw her dermatologist Dr. Ronnald Ramp who felt that this was more of a vascular ulcer. In general things are improving although she arrives today with increasing bilateral lower extremity edema with weeping a deeper fluid through the wound on the left medial leg compatible with some degree of lymphedema 12/04/16; the patient's wound is fully epithelialized but I don't think fully healed. We will do another week of depression with Promogran and TCA however I suspect we'll be able to discharge her next week. This is a very unusual-looking wound which was initially a figure-of-eight type wound lying on its side surrounded by [name] ([id number]) petechial like hemorrhage. She has had venous ablation on this side. She apparently does not have an arterial issue per Dr. Kellie Simmering. She saw her dermatologist thought it was "vascular". Patient is definitely going to need ongoing compression and I talked about this with her today she will go to elastic therapy after she leaves here next week 12/11/16; the patient's wound is not completely closed today. She has surrounding scar tissue and in further discussion with the patient it would appear that she had ulcers in this area in 2009 for a prolonged period of time ultimately requiring a punch biopsy of this area that only showed venous insufficiency. I did not previously pickup on this part of the history from the patient. 12/18/16; the patient's wound is completely epithelialized. There is no open area here. She has significant bilateral venous insufficiency with secondary lymphedema to a mild-to-moderate degree she does not have compression stockings.. She did not say anything to me when I was in the room, she told our intake nurse that she was still having pain in this area. This isn't unusual recurrent small open area. She is going  to go to elastic therapy to obtain compression stockings. 12/25/16; the patient's wound is fully epithelialized. There is no open area here. The patient describes some continued episodic discomfort in this area medial left calf. However everything looks fine and healed here. She is been to elastic therapy and caught herself 15-20 mmHg stockings, they apparently were having trouble getting 20-30 mm stockings in her size 01/22/17; this is a patient we discharged from the clinic a month ago. She has a recurrent open wound on her medial left calf. She had 15 mm support stockings. I told her I thought she needed 20-30 mm compression stockings. She tells me that she has been ill with hospitalization secondary to asthma and is been found to have severe hypokalemia likely secondary to a combination of Lasix and metolazone. This morning she noted blistering and leaking fluid on the posterior part of her left leg. She called our intake nurse urgently and we was saw her this afternoon. She has not had any real discomfort here. I don't know that she's been wearing any stockings on this leg for at least 2-3 days. ABIs in this clinic were 1.21 on the right and 1.3 on the left. She is previously seen vascular surgery who does not think that there is a peripheral arterial issue. 01/30/17; Patient arrives with no open wound on the left leg. She has been to elastic therapy and obtained 20-15mmhg below knee stockings and she has one on the right leg today Electronic Signature(s) Signed: 01/31/2017 5:50:35 AM By: Linton Ham MD Entered By: Linton Ham on 01/30/2017 13:02:21 [name], [name] ([id number]) -------------------------------------------------------------------------------- Physical Exam  Details Patient Name: [name], [name]. Date of Service: 01/30/2017 12:30 PM Medical Record Patient Account Number: [id number] Number: Treating RN: Montey Hora Dec 09, 1945 (70 y.o. Other Clinician: Date  of Birth/Sex: Female) Treating Diontae Route Primary Care Provider: [name] Provider/Extender: G Referring Provider: Donzetta Sprung in Treatment: 1 Constitutional Sitting or standing Blood Pressure is within target range for patient.. Pulse regular and within target range for patient.Marland Kitchen Respirations regular, non-labored and within target range.. Temperature is normal and within the target range for the patient.. Patient's appearance is neat and clean. Appears in no acute distress. Well nourished and well developed.. Cardiovascular Pedal pulses palpable and strong bilaterally.. Notes wound exam; leg is carefully inspected. she has no open area and edema is well controlled Electronic Signature(s) Signed: 01/31/2017 5:50:35 AM By: Linton Ham MD Entered By: Linton Ham on 01/30/2017 13:03:17 [name], [name] ([id number]) -------------------------------------------------------------------------------- Physician Orders Details Patient Name: [name], [name]. Date of Service: 01/30/2017 12:30 PM Medical Record Patient Account Number: [id number] Number: Treating RN: Montey Hora 1946-01-07 (70 y.o. Other Clinician: Date of Birth/Sex: Female) Treating Loletta Harper Primary Care Provider: [name] Provider/Extender: G Referring Provider: Donzetta Sprung in Treatment: 1 Verbal / Phone Orders: No Diagnosis Coding Discharge From [facility] Services o Discharge from [place] Signature(s) Signed: 01/30/2017 5:35:11 PM By: Montey Hora Signed: 01/31/2017 5:50:35 AM By: Linton Ham MD Entered By: Montey Hora on 01/30/2017 12:58:04 [name], [name] ([id number]) -------------------------------------------------------------------------------- Problem List Details Patient Name: [name], [name]. Date of Service: 01/30/2017 12:30 PM Medical Record Patient Account Number: [id number] Number: Treating RN:  Montey Hora [birth year] (70 y.o. Other Clinician: Date of Birth/Sex: Female) Treating Akyla Vavrek Primary Care Provider: [name] Provider/Extender: G Referring Provider: Donzetta Sprung in Treatment: 1 Active Problems ICD-10 Encounter Code Description Active Date Diagnosis L97.228 Non-pressure chronic ulcer of left calf with other specified 01/22/2017 Yes severity I89.0 Lymphedema, not elsewhere classified 01/22/2017 Yes I87.2 Venous insufficiency (chronic) (peripheral) 01/22/2017 Yes Inactive Problems Resolved Problems Electronic Signature(s) Signed: 01/31/2017 5:50:35 AM By: Linton Ham MD Entered By: Linton Ham on 01/30/2017 13:00:43 [name], [name] ([id number]) -------------------------------------------------------------------------------- Progress Note Details Patient Name: [name], [name]. Date of Service: 01/30/2017 12:30 PM Medical Record Patient Account Number: [id number] Number: Treating RN: Montey Hora Sep 13, 1946 (70 y.o. Other Clinician: Date of Birth/Sex: Female) Treating Sharmeka Palmisano Primary Care Provider: [name] Provider/Extender: G Referring Provider: Donzetta Sprung in Treatment: 1 Subjective Chief Complaint Information obtained from Patient Left calf venous stasis ulceration. 11/13/16; the patient is here for a left calf recurrent ulceration which is painful and has been present for the last month 01/22/17; the patient re-presents here for recurrent difficulties with blistering and leaking fluid on her left posterior calf History of Present Illness (HPI) Pleasant 71 year old with history of chronic venous insufficiency. No diabetes or peripheral vascular disease. Left ABI 1.29. Questionable history of left lower extremity DVT. She developed a recurrent ulceration on her left lateral calf in December 2015, which she attributes to poor diet and subsequent lower extremity edema. She underwent  endovenous laser ablation of her left greater saphenous vein in 2010. She underwent laser ablation of accessory branch of left GSV in April 2016 by Dr. Kellie Simmering at [facility]. She was previously wearing Unna boots, which she tolerated well. Tolerating 2 layer compression and cadexomer iodine. She returns to clinic for follow-up and is without new complaints. She denies any significant pain at this time. She reports persistent pain with pressure.  No claudication or ischemic rest pain. No fever or chills. No drainage. READMISSION 11/13/16; this is a 71 year old woman who is not a diabetic. She is here for a review of a painful area on her left medial lower extremity. I note that she was seen here previously last year for wound I believe to be in the same area. At that time she had undergone previously a left greater saphenous vein ablation by Dr. Kellie Simmering and she had a ablation of the anterior accessory branch of the left greater saphenous vein in March 2016. Seeing that the wound actually closed over. In reviewing the history with her today the ulcer in this area has been recurrent. She describes a biopsy of this area in 2009 that only showed stasis physiology. 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 Do not see that she has ever had arterial studies in the left leg. She tells me she has a follow-up with Dr. Kellie Simmering in roughly 10 days In any case she developed the reopening of this area roughly a month ago. On the background of this she describes rapidly increasing edema which has responded to Lasix 40 mg and metolazone 2.5 mg as well as the patient's lymph massage. She has been told she has both venous insufficiency and lymphedema but she cannot tolerate compression  stockings 11/28/16; the patient saw Dr. Kellie Simmering recently. Per the patient he did arterial Dopplers in the office that did [name], [name]. ([id number]) not show evidence of arterial insufficiency, per the patient he stated "treat this like an ordinary venous ulcer". She also saw her dermatologist Dr. Ronnald Ramp who felt that this was more of a vascular ulcer. In general things are improving although she arrives today with increasing bilateral lower extremity edema with weeping a deeper fluid through the wound on the left medial leg compatible with some degree of lymphedema 12/04/16; the patient's wound is fully epithelialized but I don't think fully healed. We will do another week of depression with Promogran and TCA however I suspect we'll be able to discharge her next week. This is a very unusual-looking wound which was initially a figure-of-eight type wound lying on its side surrounded by petechial like hemorrhage. She has had venous ablation on this side. She apparently does not have an arterial issue per Dr. Kellie Simmering. She saw her dermatologist thought it was "vascular". Patient is definitely going to need ongoing compression and I talked about this with her today she will go to elastic therapy after she leaves here next week 12/11/16; the patient's wound is not completely closed today. She has surrounding scar tissue and in further discussion with the patient it would appear that she had ulcers in this area in 2009 for a prolonged period of time ultimately requiring a punch biopsy of this area that only showed venous insufficiency. I did not previously pickup on this part of the history from the patient. 12/18/16; the patient's wound is completely epithelialized. There is no open area here. She has significant bilateral venous insufficiency with secondary lymphedema to a mild-to-moderate degree she does not have compression stockings.. She did not say anything to me when I was in the room, she told our  intake nurse that she was still having pain in this area. This isn't unusual recurrent  small open area. She is going to go to elastic therapy to obtain compression stockings. 12/25/16; the patient's wound is fully epithelialized. There is no open area here. The patient describes some continued episodic discomfort in this area medial left calf. However everything looks fine and healed here. She is been to elastic therapy and caught herself 15-20 mmHg stockings, they apparently were having trouble getting 20-30 mm stockings in her size 01/22/17; this is a patient we discharged from the clinic a month ago. She has a recurrent open wound on her medial left calf. She had 15 mm support stockings. I told her I thought she needed 20-30 mm compression stockings. She tells me that she has been ill with hospitalization secondary to asthma and is been found to have severe hypokalemia likely secondary to a combination of Lasix and metolazone. This morning she noted blistering and leaking fluid on the posterior part of her left leg. She called our intake nurse urgently and we was saw her this afternoon. She has not had any real discomfort here. I don't know that she's been wearing any stockings on this leg for at least 2-3 days. ABIs in this clinic were 1.21 on the right and 1.3 on the left. She is previously seen vascular surgery who does not think that there is a peripheral arterial issue. 01/30/17; Patient arrives with no open wound on the left leg. She has been to elastic therapy and obtained 20-20mmhg below knee stockings and she has one on the right leg today Objective Constitutional Sitting or standing Blood Pressure is within target range for patient.. Pulse regular and within target range for patient.Marland Kitchen Respirations regular, non-labored and within target range.. Temperature is normal and within the target range for the patient.. Patient's appearance is neat and clean. Appears in no acute distress.  Well nourished and well developed.Marland Kitchen RAUL, WINTERHALTER ([id number]) Vitals Time Taken: 12:41 PM, Height: 63 in, Weight: 218 lbs, BMI: 38.6, Temperature: 97.8 F, Pulse: 72 bpm, Respiratory Rate: 18 breaths/min, Blood Pressure: 112/65 mmHg. Cardiovascular Pedal pulses palpable and strong bilaterally.. General Notes: wound exam; leg is carefully inspected. she has no open area and edema is well controlled Integumentary (Hair, Skin) Wound #3 status is Open. Original cause of wound was Blister. The wound is located on the Left,Posterior Lower Leg. The wound measures 0cm length x 0cm width x 0cm depth; 0cm^2 area and 0cm^3 volume. Assessment Active Problems ICD-10 L97.228 - Non-pressure chronic ulcer of left calf with other specified severity I89.0 - Lymphedema, not elsewhere classified I87.2 - Venous insufficiency (chronic) (peripheral) Plan Discharge From [facility] Services: Discharge from [place] discharged to her own 20-31mmHg compression stockings careful attention to skin lubrication,exercise and leg elevation Electronic Signature(s) Signed: 01/31/2017 5:50:35 AM By: Linton Ham MD ALEXSYS, ESKIN ([id number]) Entered By: Linton Ham on 01/30/2017 13:04:13 [name] ([id number]) -------------------------------------------------------------------------------- SuperBill Details Patient Name: ELIYANAH, ELGERSMA. Date of Service: 01/30/2017 Medical Record Patient Account Number: [id number] Number: Treating RN: Montey Hora [birth year] (70 y.o. Other Clinician: Date of Birth/Sex: Female) Treating Alto Gandolfo Primary Care Provider: [name] Provider/Extender: G Referring Provider: Donzetta Sprung in Treatment: 1 Diagnosis Coding ICD-10 Codes Code Description L97.228 Non-pressure chronic ulcer of left calf with other specified severity I89.0 Lymphedema, not elsewhere classified I87.2 Venous insufficiency (chronic) (peripheral) Facility  Procedures CPT4 Code: [id number] Description: 02542 - WOUND CARE VISIT-LEV 2 EST PT Modifier: Quantity: 1 Physician Procedures CPT4 Code: [id number] Description: 28315 - WC PHYS LEVEL 2 - EST PT  ICD-10 Description Diagnosis I89.0 Lymphedema, not elsewhere classified I87.2 Venous insufficiency (chronic) (peripheral) Modifier: Quantity: 1 Electronic Signature(s) Signed: 01/31/2017 5:50:35 AM By: Linton Ham MD Entered By: Linton Ham on 01/30/2017 14:15:37

## 2017-02-01 NOTE — Progress Notes (Signed)
[name], [name] ([id number]) Visit Report for 01/30/2017 Arrival Information Details Patient Name: [name], [name]. Date of Service: 01/30/2017 12:30 PM Medical Record Number: [id number] Patient Account Number: [id number] Date of Birth/Sex: 24-[name]-1946 (71 y.o. Female) Treating RN: [name] Primary Care [name]: [name] Other Clinician: Referring [name]: [name] Treating [name]/Extender: [name] in Treatment: 1 Visit Information History Since Last Visit Added or deleted any medications: No Patient Arrived: Ambulatory Any new allergies or adverse reactions: No Arrival Time: 12:38 Had a fall or experienced change in No Accompanied By: self activities of daily living that may affect Transfer Assistance: None risk of falls: Patient Identification Verified: Yes Signs or symptoms of abuse/neglect since last No Secondary Verification Process Yes visito Completed: Hospitalized since last visit: No Has Dressing in Place as Prescribed: Yes Has Compression in Place as Prescribed: Yes Pain Present Now: No Electronic Signature(s) Signed: 01/30/2017 5:35:11 PM By: [name] Entered By: [name] on 01/30/2017 12:39:03 [name] ([id number]) -------------------------------------------------------------------------------- Clinic Level of Care Assessment Details Patient Name: [name], [name]. Date of Service: 01/30/2017 12:30 PM Medical Record Number: [id number] Patient Account Number: [id number] Date of Birth/Sex: May 24, 1946 (71 y.o. Female) Treating RN: [name] Primary Care [name]: [name] Other Clinician: Referring [name]: [name] Treating [name]/Extender: [name] in Treatment: 1 Clinic Level of Care Assessment Items TOOL 4 Quantity Score []  - Use when only an EandM is performed on FOLLOW-UP visit 0 ASSESSMENTS - Nursing Assessment / Reassessment X - Reassessment of  Co-morbidities (includes updates in patient status) 1 10 X - Reassessment of Adherence to Treatment Plan 1 5 ASSESSMENTS - Wound and Skin Assessment / Reassessment X - Simple Wound Assessment / Reassessment - one wound 1 5 []  - Complex Wound Assessment / Reassessment - multiple wounds 0 []  - Dermatologic / Skin Assessment (not related to wound area) 0 ASSESSMENTS - Focused Assessment []  - Circumferential Edema Measurements - multi extremities 0 []  - Nutritional Assessment / Counseling / Intervention 0 X - Lower Extremity Assessment (monofilament, tuning fork, pulses) 1 5 []  - Peripheral Arterial Disease Assessment (using hand held doppler) 0 ASSESSMENTS - Ostomy and/or Continence Assessment and Care []  - Incontinence Assessment and Management 0 []  - Ostomy Care Assessment and Management (repouching, etc.) 0 PROCESS - Coordination of Care X - Simple Patient / Family Education for ongoing care 1 15 []  - Complex (extensive) Patient / Family Education for ongoing care 0 []  - Staff obtains Programmer, systems, Records, Test Results / Process Orders 0 []  - Staff telephones HHA, Nursing Homes / Clarify orders / etc 0 []  - Routine Transfer to another Facility (non-emergent condition) 0 [name], [name]. ([id number]) []  - Routine Hospital Admission (non-emergent condition) 0 []  - New Admissions / Biomedical engineer / Ordering NPWT, Apligraf, etc. 0 []  - Emergency Hospital Admission (emergent condition) 0 X - Simple Discharge Coordination 1 10 []  - Complex (extensive) Discharge Coordination 0 PROCESS - Special Needs []  - Pediatric / Minor Patient Management 0 []  - Isolation Patient Management 0 []  - Hearing / Language / Visual special needs 0 []  - Assessment of Community assistance (transportation, D/C planning, etc.) 0 []  - Additional assistance / Altered mentation 0 []  - Support Surface(s) Assessment (bed, cushion, seat, etc.) 0 INTERVENTIONS - Wound Cleansing / Measurement X - Simple Wound  Cleansing - one wound 1 5 []  - Complex Wound Cleansing - multiple wounds 0 X - Wound Imaging (photographs - any number of wounds) 1 5 []  -  Wound Tracing (instead of photographs) 0 X - Simple Wound Measurement - one wound 1 5 []  - Complex Wound Measurement - multiple wounds 0 INTERVENTIONS - Wound Dressings []  - Small Wound Dressing one or multiple wounds 0 []  - Medium Wound Dressing one or multiple wounds 0 []  - Large Wound Dressing one or multiple wounds 0 []  - Application of Medications - topical 0 []  - Application of Medications - injection 0 INTERVENTIONS - Miscellaneous []  - External ear exam 0 Fruin, Sophea J. ([id number]) []  - Specimen Collection (cultures, biopsies, blood, body fluids, etc.) 0 []  - Specimen(s) / Culture(s) sent or taken to Lab for analysis 0 []  - Patient Transfer (multiple staff / [name] Lift / Similar devices) 0 []  - Simple Staple / Suture removal (25 or less) 0 []  - Complex Staple / Suture removal (26 or more) 0 []  - Hypo / Hyperglycemic Management (close monitor of Blood Glucose) 0 []  - Ankle / Brachial Index (ABI) - do not check if billed separately 0 X - Vital Signs 1 5 Has the patient been seen at the hospital within the last three years: Yes Total Score: 70 Level Of Care: New/Established - Level 2 Electronic Signature(s) Signed: 01/30/2017 5:35:11 PM By: [name] Entered By: [name] on 01/30/2017 12:59:17 [name] ([id number]) -------------------------------------------------------------------------------- Encounter Discharge Information Details Patient Name: [name], [name]. Date of Service: 01/30/2017 12:30 PM Medical Record Number: [id number] Patient Account Number: [id number] Date of Birth/Sex: 06-26-1946 (71 y.o. Female) Treating RN: [name] Primary Care [name]: [name] Other Clinician: Referring [name]: [name] Treating [name]/Extender: [name] in Treatment: 1 Encounter  Discharge Information Items Discharge Pain Level: 0 Discharge Condition: Stable Ambulatory Status: Ambulatory Discharge Destination: Home Transportation: Private Auto Accompanied By: self Schedule Follow-up Appointment: Yes Medication Reconciliation completed and provided to Patient/Care No Patty Lopezgarcia: Provided on Clinical Summary of Care: 01/30/2017 Form Type Recipient Paper Patient LP Electronic Signature(s) Signed: 01/30/2017 1:12:17 PM By: [name] Previous Signature: 01/30/2017 1:05:21 PM Version By: Ruthine Dose Entered By: [name] on 01/30/2017 13:12:17 [place], [name] ([id number]) -------------------------------------------------------------------------------- Lower Extremity Assessment Details Patient Name: [name], [name]. Date of Service: 01/30/2017 12:30 PM Medical Record Number: [id number] Patient Account Number: [id number] Date of Birth/Sex: [birth year] (71 y.o. Female) Treating RN: [name] Primary Care Keyonda Bickle: [name] Other Clinician: Referring Evella Kasal: [name] Treating Wyman Meschke/Extender: [name] Dillon Weeks in Treatment: 1 Edema Assessment Assessed: [Left: No] [Right: No] Edema: [Left: N] [Right: o] Vascular Assessment Pulses: Dorsalis Pedis Palpable: [Left:Yes] Posterior Tibial Extremity colors, hair growth, and conditions: Extremity Color: [Left:Hyperpigmented] Hair Growth on Extremity: [Left:Yes] Temperature of Extremity: [Left:Warm] Capillary Refill: [Left:< 3 seconds] Electronic Signature(s) Signed: 01/30/2017 5:35:11 PM By: [name] Entered By: [name] on 01/30/2017 12:47:01 Kolakowski, [name] ([id number]) -------------------------------------------------------------------------------- Multi Wound Chart Details Patient Name: [name], [name]. Date of Service: 01/30/2017 12:30 PM Medical Record Number: [id number] Patient Account Number: [id number] Date of Birth/Sex: 1946/04/11 (71 y.o.  Female) Treating RN: [name] Primary Care Monterrio Gerst: [name] Other Clinician: Referring Kerrianne [name]: [name] Treating Madelina Sanda/Extender: [name] in Treatment: 1 Vital Signs Height(in): 63 Pulse(bpm): 72 Weight(lbs): 218 Blood Pressure 112/65 (mmHg): Body Mass Index(BMI): 39 Temperature(F): 97.8 Respiratory Rate 18 (breaths/min): Photos: [3:No Photos] [N/A:N/A] Wound Location: [3:Left, Posterior Lower Leg] [N/A:N/A] Wounding Event: [3:Blister] [N/A:N/A] Primary Etiology: [3:Venous Leg Ulcer] [N/A:N/A] Date Acquired: [3:01/22/2017] [N/A:N/A] Weeks of Treatment: [3:1] [N/A:N/A] Wound Status: [3:Open] [N/A:N/A] Measurements L x W x D 0x0x0 [N/A:N/A] (cm) Area (cm) : [  3:0] [N/A:N/A] Volume (cm) : [3:0] [N/A:N/A] % Reduction in Area: [3:100.00%] [N/A:N/A] % Reduction in Volume: 100.00% [N/A:N/A] Classification: [3:Partial Thickness] [N/A:N/A] Periwound Skin Texture: No Abnormalities Noted [N/A:N/A] Periwound Skin [3:No Abnormalities Noted] [N/A:N/A] Moisture: Periwound Skin Color: No Abnormalities Noted [N/A:N/A] Tenderness on [3:No] [N/A:N/A] Treatment Notes Electronic Signature(s) Signed: 01/31/2017 5:50:35 AM By: Linton Ham MD Entered By: Linton Ham on 01/30/2017 13:00:59 [name] ([id number]) -------------------------------------------------------------------------------- Multi-Disciplinary Care Plan Details Patient Name: [name], [name]. Date of Service: 01/30/2017 12:30 PM Medical Record Number: [id number] Patient Account Number: [id number] Date of Birth/Sex: Sep 22, 1946 (71 y.o. Female) Treating RN: [name] Primary Care Drury Ardizzone: [name] Other Clinician: Referring Meelah Tallo: [name] Treating Madox Corkins/Extender: [name] in Treatment: 1 Active Inactive Electronic Signature(s) Signed: 01/30/2017 5:35:11 PM By: [name] Entered By: [name] on 01/30/2017  12:57:49 Marini, [name] ([id number]) -------------------------------------------------------------------------------- Pain Assessment Details Patient Name: [name], [name]. Date of Service: 01/30/2017 12:30 PM Medical Record Number: [id number] Patient Account Number: [id number] Date of Birth/Sex: 1945/12/05 (71 y.o. Female) Treating RN: [name] Primary Care [name] Becvar: [name] Other Clinician: Referring Anjanae Woehrle: [name] Treating Marcy Sookdeo/Extender: [name] in Treatment: 1 Active Problems Location of Pain Severity and Description of Pain Patient Has Paino No Site Locations Pain Management and Medication Current Pain Management: Notes Topical or injectable lidocaine is offered to patient for acute pain when surgical debridement is performed. If needed, Patient is instructed to use over the counter pain medication for the following 24-48 hours after debridement. Wound care MDs do not prescribed pain medications. Patient has chronic pain or uncontrolled pain. Patient has been instructed to make an appointment with their Primary Care Physician for pain management. Electronic Signature(s) Signed: 01/30/2017 5:35:11 PM By: [name] Entered By: [name] on 01/30/2017 12:40:03 [name] ([id number]) -------------------------------------------------------------------------------- Patient/Caregiver Education Details Patient Name: [name], [name]. Date of Service: 01/30/2017 12:30 PM Medical Record Patient Account Number: [id number] Number: Treating RN: [name] Dec 23, 1945 (70 y.o. Other Clinician: Date of Birth/Gender: Female) Treating ROBSON, [place] Primary Care Physician: [name] Physician/Extender: G Referring Physician: Donzetta Sprung in Treatment: 1 Education Assessment Education Provided To: Patient Education Topics Provided Basic Hygiene: Handouts: Other: compression daily from  morning until bedtime Methods: Explain/Verbal Responses: State content correctly Electronic Signature(s) Signed: 01/30/2017 5:35:11 PM By: [name] Entered By: [name] on 01/30/2017 13:12:40 [place], [name] ([id number]) -------------------------------------------------------------------------------- Wound Assessment Details Patient Name: [name], [name]. Date of Service: 01/30/2017 12:30 PM Medical Record Number: [id number] Patient Account Number: [id number] Date of Birth/Sex: Jun 12, 1946 (71 y.o. Female) Treating RN: [name] Primary Care Tomasa Dobransky: [name] Other Clinician: Referring Quantarius Genrich: [name] Treating Cherlyn Syring/Extender: [name] Dillon Weeks in Treatment: 1 Wound Status Wound Number: 3 Primary Etiology: Venous Leg Ulcer Wound Location: Left, Posterior Lower Leg Wound Status: Open Wounding Event: Blister Date Acquired: 01/22/2017 Weeks Of Treatment: 1 Clustered Wound: No Photos Photo Uploaded By: [name] on 01/30/2017 16:12:00 Wound Measurements Length: (cm) 0 % Reduction Width: (cm) 0 % Reduction Depth: (cm) 0 Area: (cm) 0 Volume: (cm) 0 in Area: 100% in Volume: 100% Wound Description Classification: Partial Thickness Periwound Skin Texture Texture Color No Abnormalities Noted: No No Abnormalities Noted: No Moisture No Abnormalities Noted: No Electronic Signature(s) Signed: 01/30/2017 5:35:11 PM By: Lambert Mody ([id number]) Entered By: [name] on 01/30/2017 12:46:24 [name] ([id number]) -------------------------------------------------------------------------------- Vitals Details Patient Name: [name], [name]. Date of Service: 01/30/2017 12:30 PM Medical Record Number: [id number]  Patient Account Number: [id number] Date of Birth/Sex: [birth year] (71 y.o. Female) Treating RN: [name] Primary Care Lewanna Petrak: [name] Other Clinician: Referring Winfred Iiams: [name] Treating Gerrell Tabet/Extender: [name] in Treatment: 1 Vital Signs Time Taken: 12:41 Temperature (F): 97.8 Height (in): 63 Pulse (bpm): 72 Weight (lbs): 218 Respiratory Rate (breaths/min): 18 Body Mass Index (BMI): 38.6 Blood Pressure (mmHg): 112/65 Reference Range: 80 - 120 mg / dl Electronic Signature(s) Signed: 01/30/2017 5:35:11 PM By: [name] Entered By: [name] on 01/30/2017 12:41:35

## 2017-02-04 ENCOUNTER — Other Ambulatory Visit: Payer: Self-pay | Admitting: Hematology & Oncology

## 2017-02-04 DIAGNOSIS — I119 Hypertensive heart disease without heart failure: Secondary | ICD-10-CM

## 2017-02-04 DIAGNOSIS — I43 Cardiomyopathy in diseases classified elsewhere: Secondary | ICD-10-CM

## 2017-02-04 DIAGNOSIS — R600 Localized edema: Secondary | ICD-10-CM

## 2017-02-04 DIAGNOSIS — C4360 Malignant melanoma of unspecified upper limb, including shoulder: Secondary | ICD-10-CM

## 2017-02-09 DIAGNOSIS — I872 Venous insufficiency (chronic) (peripheral): Secondary | ICD-10-CM | POA: Diagnosis not present

## 2017-02-09 DIAGNOSIS — M419 Scoliosis, unspecified: Secondary | ICD-10-CM | POA: Diagnosis not present

## 2017-02-09 DIAGNOSIS — I890 Lymphedema, not elsewhere classified: Secondary | ICD-10-CM | POA: Diagnosis not present

## 2017-02-09 DIAGNOSIS — M4713 Other spondylosis with myelopathy, cervicothoracic region: Secondary | ICD-10-CM | POA: Diagnosis not present

## 2017-02-09 DIAGNOSIS — M40203 Unspecified kyphosis, cervicothoracic region: Secondary | ICD-10-CM | POA: Diagnosis not present

## 2017-02-09 DIAGNOSIS — I10 Essential (primary) hypertension: Secondary | ICD-10-CM | POA: Diagnosis not present

## 2017-02-13 DIAGNOSIS — I872 Venous insufficiency (chronic) (peripheral): Secondary | ICD-10-CM | POA: Diagnosis not present

## 2017-02-13 DIAGNOSIS — M4713 Other spondylosis with myelopathy, cervicothoracic region: Secondary | ICD-10-CM | POA: Diagnosis not present

## 2017-02-13 DIAGNOSIS — M40203 Unspecified kyphosis, cervicothoracic region: Secondary | ICD-10-CM | POA: Diagnosis not present

## 2017-02-13 DIAGNOSIS — M419 Scoliosis, unspecified: Secondary | ICD-10-CM | POA: Diagnosis not present

## 2017-02-13 DIAGNOSIS — I890 Lymphedema, not elsewhere classified: Secondary | ICD-10-CM | POA: Diagnosis not present

## 2017-02-13 DIAGNOSIS — I10 Essential (primary) hypertension: Secondary | ICD-10-CM | POA: Diagnosis not present

## 2017-02-21 ENCOUNTER — Ambulatory Visit (INDEPENDENT_AMBULATORY_CARE_PROVIDER_SITE_OTHER): Payer: Medicare Other | Admitting: Adult Health

## 2017-02-21 ENCOUNTER — Encounter: Payer: Self-pay | Admitting: Adult Health

## 2017-02-21 DIAGNOSIS — G4733 Obstructive sleep apnea (adult) (pediatric): Secondary | ICD-10-CM

## 2017-02-21 DIAGNOSIS — J4531 Mild persistent asthma with (acute) exacerbation: Secondary | ICD-10-CM | POA: Diagnosis not present

## 2017-02-21 DIAGNOSIS — J301 Allergic rhinitis due to pollen: Secondary | ICD-10-CM | POA: Diagnosis not present

## 2017-02-21 NOTE — Assessment & Plan Note (Signed)
Well controlled  Cont on CPAP

## 2017-02-21 NOTE — Patient Instructions (Addendum)
Continue on CPAP At bedtime  .  May use Flonase As needed  Nasal congestion  May use Allergy eye drops As needed   follow up Dr. Elsworth Soho  In 1 year and As needed

## 2017-02-21 NOTE — Progress Notes (Signed)
@Patient  ID: [name], female    DOB: 11-28-1945, 71 y.o.   MRN: [id number]  No chief complaint on file.   Referring provider: [name], MD  HPI: 71 yo female followed for OSA on CPAP and Asthma/AR   TEST  Significant tests/ events  PSG 2003 which showed AHI of 81 events per hour and lowest desaturation to 57%. This was corrected by CPAP of 12 cm in with residual events of 5.6 events per hour on a study in 2007. CPAP 11/05/13 to 03/24/14>>used on 140 of 140 nights with average 4 hrs and 36 min. Average AHI is 6.5 with median CPAP 12 cm H2O.   PFTs 04/2014- no airway obs, ratio ok, FEV1 80%, TLC &DLCO nml   02/21/2017 Follow up : [facility]  Patient returns for a one-month follow-up. Patient was seen last visit for a slow to resolve bronchitis and allergic rhinitis. Patient was instructed to finish up her antibiotics, Zithromax. She was started on Dymista nasal spray . And started on Symbicort for 2 weeks She returns today feeling much better. Cough and congestion are resolved.  Sinus symptoms are improved. Back on Zyrtec and Singulair . We discussed plan for prevention if her allergy symptoms increase during high pollen season.   Pt has OSA on CPAP At bedtime  . Says she is doing well with no sign daytime sleepiness.  Wear each night for around 6hr .      Allergies  Allergen Reactions  . Sulfa Antibiotics Itching and Swelling    Facial swelling  . Latex Other (See Comments)    Blisters ONLY HAD REACTION TO TAPE  (??? ONLY ADHESIVE ALLERGY)  . Tape Other (See Comments)    Caused blisters - must use paper tape - same reaction from NeoPrene    Immunization History  Administered Date(s) Administered  . Influenza, High Dose Seasonal PF 08/13/2016  . Influenza,inj,Quad PF,36+ Mos 08/06/2013, 09/17/2014, 08/18/2015  . Pneumococcal Conjugate-13 09/22/2014  . Pneumococcal Polysaccharide-23 05/15/2013    Past Medical History:  Diagnosis Date  . Anesthesia  complication    trouble waking up 2/2 CPAP  . Arthritis   . Asthma in adult   . Cervical spondylosis 2012   [facility], [place])  . Choroidal nevus of left eye 03/22/2016  . Chronic venous insufficiency 2016   severe reflux with painful varicosities sees [facility]  . Complication of anesthesia    difficulty coming out due to sleep apnea per pt  . Difficult intubation    PATIENT DENIES   . DVT (deep venous thrombosis) ([place]) 2011   small, developed after venous ablation  . Family history of adverse reaction to anesthesia    O2 levels drop upon waking   . Hearing loss sensory, bilateral 2013   mod-severe high freq sensorineural ([facility])  . History of kidney stones 1980s  . History of radiation therapy 07/19/11-08/25/2011   RIGHT AXILLARY REGION/METASTATIC  . Hypertension   . Kidney stones   . Melanoma ([place]) 06/29/10   MALIGNANT MELANOMA R SHOULDER/SUPRASCAPULAR BACK s/p interferon chemo and XRT  . Neuromuscular disorder ([facility])    muscle spasms  . OSA (obstructive sleep apnea)    on CPAP  . Pneumonia    2001  . RLS (restless legs syndrome)   . Seasonal allergies   . Tinnitus   . Unspecified vitamin D deficiency 03/18/2013    Tobacco History: History  Smoking Status  . Former Smoker  . Types: Cigarettes  . Quit date: 11/05/1965  Smokeless Tobacco  . Never Used    Comment: socially as a teen   Counseling given: Not Answered   Outpatient Encounter Prescriptions as of 02/21/2017  Medication Sig  . acetaminophen (TYLENOL ARTHRITIS PAIN) 650 MG CR tablet Take 1,300 mg by mouth every 8 (eight) hours as needed for pain.   Marland Kitchen albuterol (PROVENTIL) (2.5 MG/3ML) 0.083% nebulizer solution Take 3 mLs (2.5 mg total) by nebulization every 6 (six) hours as needed for wheezing or shortness of breath.  Marland Kitchen aspirin EC 81 MG tablet Take 81 mg by mouth at bedtime. Melanoma prevention  . azithromycin (ZITHROMAX) 250 MG tablet Take two tablets on day one followed by one tablet on days 2-5  .  cetirizine (ZYRTEC) 10 MG tablet Take 10 mg by mouth daily.  . furosemide (LASIX) 40 MG tablet TAKE 1 TABLET (40 MG TOTAL) BY MOUTH DAILY.  Marland Kitchen gabapentin (NEURONTIN) 100 MG capsule TAKE 1 CAPSULE (100 MG TOTAL) BY MOUTH AT BEDTIME.  Marland Kitchen losartan (COZAAR) 50 MG tablet TAKE 1 TABLET (50 MG TOTAL) BY MOUTH DAILY.  . metolazone (ZAROXOLYN) 2.5 MG tablet TAKE 1 TABLET DAILY 1 HOUR PRIOR TO FUROSEMIDE.  Marland Kitchen montelukast (SINGULAIR) 10 MG tablet TAKE 1 TABLET BY MOUTH DAILY  . Multiple Vitamin (MULTIVITAMIN WITH MINERALS) TABS tablet Take 1 tablet by mouth daily.  . Polyvinyl Alcohol-Povidone (TEARS PLUS OP) Place 1 drop into both eyes daily as needed (dry eyes/ redness/ burning).  . potassium chloride (KLOR-CON 10) 10 MEQ tablet Take 1 tablet (10 mEq total) by mouth daily.  Marland Kitchen PRESCRIPTION MEDICATION CPAP  . PROAIR HFA 108 (90 Base) MCG/ACT inhaler INHALE 2 PUFFS INTO LUNGS EVERY 6 HOURS AS NEEDED FOR WHEEZING OR SHORTNESS OF BREATH  . Probiotic Product (PROBIOTIC DAILY PO) Take 1 tablet by mouth daily.   . Vitamin D, Ergocalciferol, (DRISDOL) 50000 units CAPS capsule TAKE 1 CAPSULE (50,000 UNITS TOTAL) BY MOUTH EVERY 7 (SEVEN) DAYS.  . [DISCONTINUED] benzonatate (TESSALON) 100 MG capsule Take 1 capsule (100 mg total) by mouth 2 (two) times daily as needed for cough. (Patient not taking: Reported on 01/09/2017)   No facility-administered encounter medications on file as of 02/21/2017.      Review of Systems  Constitutional:   No  weight loss, night sweats,  Fevers, chills, fatigue, or  lassitude.  HEENT:   No headaches,  Difficulty swallowing,  Tooth/dental problems, or  Sore throat,                No sneezing, itching, ear ache,  +nasal congestion, post nasal drip,   CV:  No chest pain,  Orthopnea, PND, swelling in lower extremities, anasarca, dizziness, palpitations, syncope.   GI  No heartburn, indigestion, abdominal pain, nausea, vomiting, diarrhea, change in bowel habits, loss of appetite, bloody  stools.   Resp:   No chest wall deformity  Skin: no rash or lesions.  GU: no dysuria, change in color of urine, no urgency or frequency.  No flank pain, no hematuria   MS:  No joint pain or swelling.  No decreased range of motion.  No back pain.    Physical Exam  BP 107/69 (BP Location: Left Arm, Patient Position: Sitting, Cuff Size: Large)   Pulse 73   Ht 5\' 3"  (1.6 m)   Wt 220 lb (99.8 kg)   SpO2 95%   BMI 38.97 kg/m   GEN: A/Ox3; pleasant , NAD, obese    HEENT:  [place]/AT,  EACs-clear, TMs-wnl, NOSE-clear drainage  THROAT-clear, no lesions,  no postnasal drip or exudate noted.   NECK:  Supple w/ fair ROM; no JVD; normal carotid impulses w/o bruits; no thyromegaly or nodules palpated; no lymphadenopathy.    RESP  Clear  P & A; w/o, wheezes/ rales/ or rhonchi. no accessory muscle use, no dullness to percussion  CARD:  RRR, no m/r/g, tr  peripheral edema, pulses intact, no cyanosis or clubbing.  GI:   Soft & nt; nml bowel sounds; no organomegaly or masses detected.   Musco: Warm bil, no deformities or joint swelling noted.   Neuro: alert, no focal deficits noted.    Skin: Warm, no lesions or rashes      BNP No results found for: BNP  Imaging: No results found.   Assessment & Plan:   OSA (obstructive sleep apnea) Well controlled  Cont on CPAP   Asthma exacerbation Recent flare now resolved.  Cont w/ trigger prevention   Allergic rhinitis Recent flare now resolving  Set up action plan w/ Flonase and otc allergy drops As needed  w/ increased allergy sx . Cont on zyrtec and singulair .      Rexene Edison, NP 02/21/2017

## 2017-02-21 NOTE — Assessment & Plan Note (Signed)
Recent flare now resolved.  Cont w/ trigger prevention

## 2017-02-21 NOTE — Assessment & Plan Note (Signed)
Recent flare now resolving  Set up action plan w/ Flonase and otc allergy drops As needed  w/ increased allergy sx . Cont on zyrtec and singulair .

## 2017-03-04 NOTE — Progress Notes (Signed)
Reviewed & agree with plan  

## 2017-03-08 ENCOUNTER — Encounter: Payer: Self-pay | Admitting: Family Medicine

## 2017-03-08 ENCOUNTER — Ambulatory Visit (INDEPENDENT_AMBULATORY_CARE_PROVIDER_SITE_OTHER): Payer: Medicare Other | Admitting: Family Medicine

## 2017-03-08 ENCOUNTER — Ambulatory Visit (INDEPENDENT_AMBULATORY_CARE_PROVIDER_SITE_OTHER)
Admission: RE | Admit: 2017-03-08 | Discharge: 2017-03-08 | Disposition: A | Discharge status: Home / Self Care | Payer: Medicare Other | Source: Ambulatory Visit | Attending: Family Medicine | Admitting: Family Medicine

## 2017-03-08 ENCOUNTER — Other Ambulatory Visit: Payer: Self-pay | Admitting: Family Medicine

## 2017-03-08 ENCOUNTER — Telehealth: Payer: Self-pay | Admitting: Family Medicine

## 2017-03-08 VITALS — BP 102/68 | Temp 98.2°F | Wt 221.0 lb

## 2017-03-08 DIAGNOSIS — I8312 Varicose veins of left lower extremity with inflammation: Secondary | ICD-10-CM

## 2017-03-08 DIAGNOSIS — M25621 Stiffness of right elbow, not elsewhere classified: Secondary | ICD-10-CM

## 2017-03-08 DIAGNOSIS — E876 Hypokalemia: Secondary | ICD-10-CM

## 2017-03-08 DIAGNOSIS — M25421 Effusion, right elbow: Secondary | ICD-10-CM | POA: Diagnosis not present

## 2017-03-08 LAB — BASIC METABOLIC PANEL
BUN: 29 mg/dL — ABNORMAL HIGH (ref 6–23)
CO2: 35 mEq/L — ABNORMAL HIGH (ref 19–32)
Calcium: 10.5 mg/dL (ref 8.4–10.5)
Chloride: 96 mEq/L (ref 96–112)
Creatinine, Ser: 0.89 mg/dL (ref 0.40–1.20)
GFR: 66.52 mL/min (ref >60.00)
Glucose, Bld: 132 mg/dL — ABNORMAL HIGH (ref 70–99)
Potassium: 2.8 mEq/L — CL (ref 3.5–5.1)
Sodium: 139 mEq/L (ref 135–145)

## 2017-03-08 LAB — CBC WITH DIFFERENTIAL/PLATELET
Basophils Absolute: 0 10*3/uL (ref 0.0–0.1)
Basophils Relative: 0.3 % (ref 0.0–3.0)
Eosinophils Absolute: 0.2 10*3/uL (ref 0.0–0.7)
Eosinophils Relative: 2.4 % (ref 0.0–5.0)
HCT: 39.2 % (ref 36.0–46.0)
Hemoglobin: 13.2 g/dL (ref 12.0–15.0)
Lymphocytes Relative: 27.4 % (ref 12.0–46.0)
Lymphs Abs: 2.2 10*3/uL (ref 0.7–4.0)
MCHC: 33.6 g/dL (ref 30.0–36.0)
MCV: 88.8 fl (ref 78.0–100.0)
Monocytes Absolute: 0.6 10*3/uL (ref 0.1–1.0)
Monocytes Relative: 7.3 % (ref 3.0–12.0)
Neutro Abs: 5.1 10*3/uL (ref 1.4–7.7)
Neutrophils Relative %: 62.6 % (ref 43.0–77.0)
Platelets: 253 10*3/uL (ref 150.0–400.0)
RBC: 4.41 Mil/uL (ref 3.87–5.11)
RDW: 13.6 % (ref 11.5–15.5)
WBC: 8.2 10*3/uL (ref 4.0–10.5)

## 2017-03-08 LAB — SEDIMENTATION RATE: Sed Rate: 14 mm/hr (ref 0–30)

## 2017-03-08 NOTE — Assessment & Plan Note (Addendum)
Anticipate mild acute flare of chronic issue. No current ulcer. No concerning signs today - will continue current regimen. I discussed return to [facility] when she decides bothersome enough to undergo previously discussed intervention.

## 2017-03-08 NOTE — Assessment & Plan Note (Signed)
Concerning loss of range of motion which patient states is new - in setting of recent increased activity yesterday. No significant erythema or warmth of elbow - but given decreased ROM, will check STAT BMP, CBC, ESR to evaluate for septic joint. Will also check xrays to r/o occult bone injury. Will call pt with results and plan. Pt agrees.

## 2017-03-08 NOTE — Telephone Encounter (Signed)
Thank you Merrilee Seashore - I will touch base with patient over weekend.

## 2017-03-08 NOTE — Progress Notes (Signed)
BP 102/68   Temp 98.2 F (36.8 C) (Oral)   Wt 221 lb (100.2 kg)   BMI 39.15 kg/m    CC: elbow pain, check left leg Subjective:    Patient ID: [name], female    DOB: 04/12/1946, 71 y.o.   MRN: [id number]  HPI: [name] is a 72 y.o. female presenting on 03/08/2017 for Elbow Pain (right, increased activity w/ moving "things in storage building" yesterday) and Varicose Veins (left leg, painful. Pr reports swollen thigh. Pt reports CT in March was clear)   R elbow pain that started yesterday after increased activity - moving things in storage unit while finding summer clothes. Today woke up with soreness at elbow. Pain starts anterior elbow and radiates to posterior elbow. Denies inciting trauma or injury. No fevers/chills.  Worsening L leg pain worse with walking, predominantly at R inner thigh.   Known L>R venous insufficiency with varicose veins with inflammation. Has previusly seen Dr Kellie Simmering.   Relevant past medical, surgical, family and social history reviewed and updated as indicated. Interim medical history since our last visit reviewed. Allergies and medications reviewed and updated. Outpatient Medications Prior to Visit  Medication Sig Dispense Refill  . acetaminophen (TYLENOL ARTHRITIS PAIN) 650 MG CR tablet Take 1,300 mg by mouth every 8 (eight) hours as needed for pain.     Marland Kitchen albuterol (PROVENTIL) (2.5 MG/3ML) 0.083% nebulizer solution Take 3 mLs (2.5 mg total) by nebulization every 6 (six) hours as needed for wheezing or shortness of breath. 150 mL 1  . aspirin EC 81 MG tablet Take 81 mg by mouth at bedtime. Melanoma prevention    . cetirizine (ZYRTEC) 10 MG tablet Take 10 mg by mouth daily.    . furosemide (LASIX) 40 MG tablet TAKE 1 TABLET (40 MG TOTAL) BY MOUTH DAILY. 30 tablet 3  . gabapentin (NEURONTIN) 100 MG capsule TAKE 1 CAPSULE (100 MG TOTAL) BY MOUTH AT BEDTIME. 30 capsule 3  . losartan (COZAAR) 50 MG tablet TAKE 1 TABLET (50 MG TOTAL) BY MOUTH DAILY.  30 tablet 3  . metolazone (ZAROXOLYN) 2.5 MG tablet TAKE 1 TABLET DAILY 1 HOUR PRIOR TO FUROSEMIDE. 30 tablet 4  . montelukast (SINGULAIR) 10 MG tablet TAKE 1 TABLET BY MOUTH DAILY 30 tablet 6  . Multiple Vitamin (MULTIVITAMIN WITH MINERALS) TABS tablet Take 1 tablet by mouth daily.    . Polyvinyl Alcohol-Povidone (TEARS PLUS OP) Place 1 drop into both eyes daily as needed (dry eyes/ redness/ burning).    . potassium chloride (KLOR-CON 10) 10 MEQ tablet Take 1 tablet (10 mEq total) by mouth daily. (Patient taking differently: Take 20 mEq by mouth daily. ) 60 tablet 3  . PRESCRIPTION MEDICATION CPAP    . PROAIR HFA 108 (90 Base) MCG/ACT inhaler INHALE 2 PUFFS INTO LUNGS EVERY 6 HOURS AS NEEDED FOR WHEEZING OR SHORTNESS OF BREATH 8.5 Inhaler 3  . Probiotic Product (PROBIOTIC DAILY PO) Take 1 tablet by mouth daily.     . Vitamin D, Ergocalciferol, (DRISDOL) 50000 units CAPS capsule TAKE 1 CAPSULE (50,000 UNITS TOTAL) BY MOUTH EVERY 7 (SEVEN) DAYS. 4 capsule 3  . azithromycin (ZITHROMAX) 250 MG tablet Take two tablets on day one followed by one tablet on days 2-5 6 each 0   No facility-administered medications prior to visit.      Per HPI unless specifically indicated in ROS section below Review of Systems     Objective:    BP 102/68   Temp  98.2 F (36.8 C) (Oral)   Wt 221 lb (100.2 kg)   BMI 39.15 kg/m   Wt Readings from Last 3 Encounters:  03/08/17 221 lb (100.2 kg)  02/21/17 220 lb (99.8 kg)  01/10/17 213 lb (96.6 kg)    Physical Exam  Constitutional: She appears well-developed and well-nourished. No distress.  Musculoskeletal: She exhibits edema (chronic nonpitting edema bilaterally).  L elbow FROM flexion/extension R elbow with limited ROM full flexion and extension due to pain and some stiffness No erythema, edema, significant warmth or point tenderness at elbow at olecranon, or epicondyles  L inner thigh tender to palpation Varicose veins present throughout BLE  Nursing  note and vitals reviewed.  Results for orders placed or performed in visit on 03/08/17  CBC with Differential/Platelet  Result Value Ref Range   WBC 8.2 4.0 - 10.5 K/uL   RBC 4.41 3.87 - 5.11 Mil/uL   Hemoglobin 13.2 12.0 - 15.0 g/dL   HCT 39.2 36.0 - 46.0 %   MCV 88.8 78.0 - 100.0 fl   MCHC 33.6 30.0 - 36.0 g/dL   RDW 13.6 11.5 - 15.5 %   Platelets 253.0 150.0 - 400.0 K/uL   Neutrophils Relative % 62.6 43.0 - 77.0 %   Lymphocytes Relative 27.4 12.0 - 46.0 %   Monocytes Relative 7.3 3.0 - 12.0 %   Eosinophils Relative 2.4 0.0 - 5.0 %   Basophils Relative 0.3 0.0 - 3.0 %   Neutro Abs 5.1 1.4 - 7.7 K/uL   Lymphs Abs 2.2 0.7 - 4.0 K/uL   Monocytes Absolute 0.6 0.1 - 1.0 K/uL   Eosinophils Absolute 0.2 0.0 - 0.7 K/uL   Basophils Absolute 0.0 0.0 - 0.1 K/uL  Basic metabolic panel  Result Value Ref Range   Sodium 139 135 - 145 mEq/L   Potassium 2.8 (LL) 3.5 - 5.1 mEq/L   Chloride 96 96 - 112 mEq/L   CO2 35 (H) 19 - 32 mEq/L   Glucose, Bld 132 (H) 70 - 99 mg/dL   BUN 29 (H) 6 - 23 mg/dL   Creatinine, Ser 0.89 0.40 - 1.20 mg/dL   Calcium 10.5 8.4 - 10.5 mg/dL   GFR 66.52 >60.00 mL/min  Sedimentation rate  Result Value Ref Range   Sed Rate 14 0 - 30 mm/hr   RIGHT ELBOW - COMPLETE 3+ VIEW COMPARISON:  No recent prior. FINDINGS: Right elbow joint effusion noted. Tiny bony density is noted along the anterior aspect of the elbow. This could represent tiny loose body, a tiny avulsion fracture fragment cannot be exclude. Bony density noted along the ulnar epicondyle consistent with non fused secondary ossification center versus small avulsion injury, possibly old. Radial head appears to be intact. Degenerative changes are present with loose bodies. Follow-up imaging in 7-10 days may prove useful to evaluate for occult fracture. IMPRESSION: 1. Right elbow joint effusion. 2. Degenerative changes left(? - anticipate right) elbow with loose bodies. Tiny bony density noted along the  anterior aspect of the elbow on lateral view may represent tiny loose body versus tiny avulsion fracture fragment. Tiny bony density noted along the ulnar epicondyle of the humerus most likely non fused secondary ossification center or tiny old fracture. Radial head appears intact. Follow-up imaging in 7-10 days may prove useful to evaluate for occult fracture. Electronically Signed   By: Marcello Moores  Register   On: 03/08/2017 16:28    Assessment & Plan:   Problem List Items Addressed This Visit    Elbow stiffness,  right - Primary    Concerning loss of range of motion which patient states is new - in setting of recent increased activity yesterday. No significant erythema or warmth of elbow - but given decreased ROM, will check STAT BMP, CBC, ESR to evaluate for septic joint. Will also check xrays to r/o occult bone injury. Will call pt with results and plan. Pt agrees.       Relevant Orders   DG ELBOW COMPLETE RIGHT (3+VIEW) (Completed)   CBC with Differential/Platelet (Completed)   Basic metabolic panel (Completed)   Sedimentation rate (Completed)   Varicose veins of left lower extremity with inflammation    Anticipate mild acute flare of chronic issue. No current ulcer. No concerning signs today - will continue current regimen. I discussed return to [facility] when she decides bothersome enough to undergo previously discussed intervention.           Follow up plan: Return if symptoms worsen or fail to improve.  [name], MD

## 2017-03-08 NOTE — Telephone Encounter (Signed)
Received call about critical potassium of 2.8. I review previous BMP's and see she has a hx of hypokalemia and is on Lasix. I  tried to contact the patient on 2 occasions but got her voicemail. Will try again in 30 min.   I did speak with the patient to let her know of the results. Recommended she take 3 tabs of 10 mEq 3 times daily through the rest of the weekend. She took 20 meq this AM. Go to ER if fluttering in chest, SOB or Chest pain. Order for BMP and a Mg placed for Mon when I want her to follow up with her reg PCP. The patient voiced understanding and agreement to the plan. She did confirm she has around 30 tabs remaining.  Will route to PCP and place orders.

## 2017-03-08 NOTE — Telephone Encounter (Signed)
Called and left message on answering machine. Discussed labs, xray. rec increase potassium to 65mEq daily over weekend. Then will start new potassium dose of 20mEq BID - sent to pharmacy.   ==> oh I see pt was able to be contacted.

## 2017-03-08 NOTE — Patient Instructions (Signed)
R elbow xray today Labs today We will be in touch with results.

## 2017-03-11 ENCOUNTER — Telehealth: Payer: Self-pay | Admitting: *Deleted

## 2017-03-11 ENCOUNTER — Ambulatory Visit (INDEPENDENT_AMBULATORY_CARE_PROVIDER_SITE_OTHER): Payer: Medicare Other | Admitting: Family Medicine

## 2017-03-11 ENCOUNTER — Encounter: Payer: Self-pay | Admitting: Family Medicine

## 2017-03-11 VITALS — BP 106/70 | HR 76 | Temp 97.8°F | Ht 63.0 in | Wt 222.2 lb

## 2017-03-11 DIAGNOSIS — E876 Hypokalemia: Secondary | ICD-10-CM

## 2017-03-11 DIAGNOSIS — M25621 Stiffness of right elbow, not elsewhere classified: Secondary | ICD-10-CM

## 2017-03-11 LAB — MAGNESIUM: Magnesium: 1.9 mg/dL (ref 1.5–2.5)

## 2017-03-11 LAB — POTASSIUM: Potassium: 3.1 mEq/L — ABNORMAL LOW (ref 3.5–5.1)

## 2017-03-11 MED ORDER — POTASSIUM CHLORIDE CRYS ER 20 MEQ PO TBCR: 20.0000 meq | EXTENDED_RELEASE_TABLET | Freq: Two times a day (BID) | ORAL | 6 refills | Status: DC

## 2017-03-11 MED ORDER — FUROSEMIDE 40 MG PO TABS: 40.0000 mg | ORAL_TABLET | Freq: Every day | ORAL | 6 refills | Status: DC

## 2017-03-11 NOTE — Assessment & Plan Note (Signed)
Ongoing - due to regular lasix and zaroxolyn use - will check K, Mg today, will increase K-dur to 80mEq BID. Pt agrees with plan.

## 2017-03-11 NOTE — Progress Notes (Signed)
Pre visit review using our clinic review tool, if applicable. No additional management support is needed unless otherwise documented below in the visit note. 

## 2017-03-11 NOTE — Assessment & Plan Note (Signed)
Ongoing discomfort - no better, no worse. ?tiny avulsion fracture. Will return Friday for xray. Consider ortho referral if any worsening symptoms. For now, will continue R elbow rest and watch for improvement.

## 2017-03-11 NOTE — Patient Instructions (Signed)
Labs today.  New potassium dose will be 8mEq twice daily.  Continue to rest elbow.  Return Friday afternoon for repeat xray of right elbow to compare.  If any worsening pain, let us know for orthopedic referral.

## 2017-03-11 NOTE — Progress Notes (Signed)
BP 106/70   Pulse 76   Temp 97.8 F (36.6 C) (Oral)   Ht '5\' 3"'  (1.6 m)   Wt 222 lb 4 oz (100.8 kg)   BMI 39.37 kg/m    CC: f/u labs Subjective:    Patient ID: [name], female    DOB: [birth year], 71 y.o.   MRN: [id number]  HPI: [name] is a 71 y.o. female presenting on 03/11/2017 for Follow-up (Labs)   See prior note for details.  Seen here Friday with R elbow stiffness - concern for injury vs infection. Xray showed effusion and possible tiny avulsion fracture anteriolaterally. CBC, ESR normal. K low at 2.8. She did take Kdur 62mq TID over weekend. Mild GI discomfort with this.   Relevant past medical, surgical, family and social history reviewed and updated as indicated. Interim medical history since our last visit reviewed. Allergies and medications reviewed and updated. Outpatient Medications Prior to Visit  Medication Sig Dispense Refill  . acetaminophen (TYLENOL ARTHRITIS PAIN) 650 MG CR tablet Take 1,300 mg by mouth every 8 (eight) hours as needed for pain.     .Marland Kitchenalbuterol (PROVENTIL) (2.5 MG/3ML) 0.083% nebulizer solution Take 3 mLs (2.5 mg total) by nebulization every 6 (six) hours as needed for wheezing or shortness of breath. 150 mL 1  . aspirin EC 81 MG tablet Take 81 mg by mouth at bedtime. Melanoma prevention    . cetirizine (ZYRTEC) 10 MG tablet Take 10 mg by mouth daily.    .Marland Kitchengabapentin (NEURONTIN) 100 MG capsule TAKE 1 CAPSULE (100 MG TOTAL) BY MOUTH AT BEDTIME. 30 capsule 3  . losartan (COZAAR) 50 MG tablet TAKE 1 TABLET (50 MG TOTAL) BY MOUTH DAILY. 30 tablet 3  . metolazone (ZAROXOLYN) 2.5 MG tablet TAKE 1 TABLET DAILY 1 HOUR PRIOR TO FUROSEMIDE. 30 tablet 4  . montelukast (SINGULAIR) 10 MG tablet TAKE 1 TABLET BY MOUTH DAILY 30 tablet 6  . Multiple Vitamin (MULTIVITAMIN WITH MINERALS) TABS tablet Take 1 tablet by mouth daily.    . Polyvinyl Alcohol-Povidone (TEARS PLUS OP) Place 1 drop into both eyes daily as needed (dry eyes/ redness/ burning).      .Marland KitchenPRESCRIPTION MEDICATION CPAP    . PROAIR HFA 108 (90 Base) MCG/ACT inhaler INHALE 2 PUFFS INTO LUNGS EVERY 6 HOURS AS NEEDED FOR WHEEZING OR SHORTNESS OF BREATH 8.5 Inhaler 3  . Probiotic Product (PROBIOTIC DAILY PO) Take 1 tablet by mouth daily.     . Vitamin D, Ergocalciferol, (DRISDOL) 50000 units CAPS capsule TAKE 1 CAPSULE (50,000 UNITS TOTAL) BY MOUTH EVERY 7 (SEVEN) DAYS. 4 capsule 3  . furosemide (LASIX) 40 MG tablet TAKE 1 TABLET (40 MG TOTAL) BY MOUTH DAILY. 30 tablet 3  . potassium chloride (KLOR-CON 10) 10 MEQ tablet Take 1 tablet (10 mEq total) by mouth daily. (Patient taking differently: Take 20 mEq by mouth daily. ) 60 tablet 3   No facility-administered medications prior to visit.      Per HPI unless specifically indicated in ROS section below Review of Systems     Objective:    BP 106/70   Pulse 76   Temp 97.8 F (36.6 C) (Oral)   Ht '5\' 3"'  (1.6 m)   Wt 222 lb 4 oz (100.8 kg)   BMI 39.37 kg/m   Wt Readings from Last 3 Encounters:  03/11/17 222 lb 4 oz (100.8 kg)  03/08/17 221 lb (100.2 kg)  02/21/17 220 lb (99.8 kg)    Physical  Exam  Constitutional: She appears well-developed and well-nourished. No distress.  Musculoskeletal: She exhibits no edema.  FROM L elbow flexion/extension Limited ROM R elbow 2/2 discomfort in both flexion and extension No pain with palpation of epicondyles or olecranon bursa, no pain anterior elbow, no signifiant reproducible pain throughout elbow, just mild discomfort to palpation of medial elbow.   Nursing note and vitals reviewed.  Results for orders placed or performed in visit on 03/08/17  CBC with Differential/Platelet  Result Value Ref Range   WBC 8.2 4.0 - 10.5 K/uL   RBC 4.41 3.87 - 5.11 Mil/uL   Hemoglobin 13.2 12.0 - 15.0 g/dL   HCT 39.2 36.0 - 46.0 %   MCV 88.8 78.0 - 100.0 fl   MCHC 33.6 30.0 - 36.0 g/dL   RDW 13.6 11.5 - 15.5 %   Platelets 253.0 150.0 - 400.0 K/uL   Neutrophils Relative % 62.6 43.0 - 77.0 %    Lymphocytes Relative 27.4 12.0 - 46.0 %   Monocytes Relative 7.3 3.0 - 12.0 %   Eosinophils Relative 2.4 0.0 - 5.0 %   Basophils Relative 0.3 0.0 - 3.0 %   Neutro Abs 5.1 1.4 - 7.7 K/uL   Lymphs Abs 2.2 0.7 - 4.0 K/uL   Monocytes Absolute 0.6 0.1 - 1.0 K/uL   Eosinophils Absolute 0.2 0.0 - 0.7 K/uL   Basophils Absolute 0.0 0.0 - 0.1 K/uL  Basic metabolic panel  Result Value Ref Range   Sodium 139 135 - 145 mEq/L   Potassium 2.8 (LL) 3.5 - 5.1 mEq/L   Chloride 96 96 - 112 mEq/L   CO2 35 (H) 19 - 32 mEq/L   Glucose, Bld 132 (H) 70 - 99 mg/dL   BUN 29 (H) 6 - 23 mg/dL   Creatinine, Ser 0.89 0.40 - 1.20 mg/dL   Calcium 10.5 8.4 - 10.5 mg/dL   GFR 66.52 >60.00 mL/min  Sedimentation rate  Result Value Ref Range   Sed Rate 14 0 - 30 mm/hr   RIGHT ELBOW - COMPLETE 3+ VIEW COMPARISON:  No recent prior. FINDINGS: Right elbow joint effusion noted. Tiny bony density is noted along the anterior aspect of the elbow. This could represent tiny loose body, a tiny avulsion fracture fragment cannot be exclude. Bony density noted along the ulnar epicondyle consistent with non fused secondary ossification center versus small avulsion injury, possibly old. Radial head appears to be intact. Degenerative changes are present with loose bodies. Follow-up imaging in 7-10 days may prove useful to evaluate for occult fracture. IMPRESSION: 1. Right elbow joint effusion. 2. Degenerative changes left elbow with loose bodies. Tiny bony density noted along the anterior aspect of the elbow on lateral view may represent tiny loose body versus tiny avulsion fracture fragment. Tiny bony density noted along the ulnar epicondyle of the humerus most likely non fused secondary ossification center or tiny old fracture. Radial head appears intact. Follow-up imaging in 7-10 days may prove useful to evaluate for occult fracture. Electronically Signed   By: Marcello Moores  Register   On: 03/08/2017 16:28    Assessment &  Plan:   Problem List Items Addressed This Visit    Elbow stiffness, right    Ongoing discomfort - no better, no worse. ?tiny avulsion fracture. Will return Friday for xray. Consider ortho referral if any worsening symptoms. For now, will continue R elbow rest and watch for improvement.       Hypokalemia - Primary    Ongoing - due to regular lasix  and zaroxolyn use - will check K, Mg today, will increase K-dur to 73mq BID. Pt agrees with plan.       Relevant Orders   Magnesium   Potassium       Follow up plan: No Follow-up on file.  JRia Bush MD

## 2017-03-11 NOTE — Telephone Encounter (Signed)
[facility] Patient Name: YASMEEN MANKA Gender: Female DOB: May 24, 1946 Age: 71 Y 51 M Return Phone Number: City/State/Zip: [place] Client [place] Night - Client Client Site [place] Physician [name] - MD Who Is Calling Lab Lab Name [facility] Lab Phone Number [phone] Lab Tech Name [facility] Lab Reference Number Chief Complaint Lab Result (Critical or Stat) Call Type Lab Send to RN Reason for Call Report lab results Initial Comment Caller needs to report a critical lab result. Additional Comment Nurse Assessment Guidelines Guideline Title Affirmed Question Disp. Time [name] Time) Disposition Final User 03/08/2017 5:20:55 PM Clinical Call Yes Zayas, RN, Lenna Sciara

## 2017-03-11 NOTE — Telephone Encounter (Signed)
[facility] Patient Name: [name] Gender: Female DOB: 06-Nov-1945 Age: 71 Y 70 M 1 D Return Phone Number: [phone] (Primary) City/State/Zip: [place] Client [place] Night - Client Client Site [place] Physician [name] - MD Who Is Calling Patient / Member / Family / Caregiver Call Type Triage / Clinical Relationship To Patient Self Return Phone Number [phone] (Primary) Chief Complaint Medication Question (non symptomatic) Reason for Call Symptomatic / Request for Health Information Initial Comment Was seen recently due to low potassium that was found due to blood work. Was told to increase her medication dosage. Later on another doctor advised her to increase it again. Is need clarification on how many she needs to be taking. Nurse Assessment Nurse: Martyn Ehrich, RN, Felicia Date/Time (Eastern Time): 03/08/2017 8:37:02 PM Confirm and document reason for call. If symptomatic, describe symptoms. ---PT saw Dr. Leo Grosser today and she had a message from on call MD and he told her her potassium was very low she should increase from 2 potassium pills she should take 3 more potassium pills a day and then take 3 TID. Then she came back in and there was a call from [facility] and he said instead of 2 a day take 3 tablets a day. (she is confused and wants clarification) Pottasium 10 meq on hand. Was on 2 tablets daily. No symptoms. Wendling said take 3 tablets tonight. (she has had 2 potassium pills between 10 and 1 pm MD visit was around 2:30 pm and lab was done today.) Please document clinical information provided and list any resource used. ---see paged section Guidelines Guideline Title Affirmed Question Disp. Time [name] Time) Disposition Final User 03/08/2017 8:48:43 PM Clinical Call Yes Martyn Ehrich, RN, Solmon Ice Comments User:

## 2017-03-12 ENCOUNTER — Encounter: Payer: Self-pay | Admitting: *Deleted

## 2017-03-15 ENCOUNTER — Ambulatory Visit (INDEPENDENT_AMBULATORY_CARE_PROVIDER_SITE_OTHER)
Admission: RE | Admit: 2017-03-15 | Discharge: 2017-03-15 | Disposition: A | Discharge status: Home / Self Care | Payer: Medicare Other | Source: Ambulatory Visit | Attending: Family Medicine | Admitting: Family Medicine

## 2017-03-15 ENCOUNTER — Other Ambulatory Visit: Payer: Self-pay | Admitting: Family Medicine

## 2017-03-15 DIAGNOSIS — M25521 Pain in right elbow: Secondary | ICD-10-CM

## 2017-03-18 ENCOUNTER — Ambulatory Visit (INDEPENDENT_AMBULATORY_CARE_PROVIDER_SITE_OTHER): Payer: Medicare Other | Admitting: Podiatry

## 2017-03-18 DIAGNOSIS — I872 Venous insufficiency (chronic) (peripheral): Secondary | ICD-10-CM

## 2017-03-18 DIAGNOSIS — M79676 Pain in unspecified toe(s): Secondary | ICD-10-CM

## 2017-03-18 DIAGNOSIS — B351 Tinea unguium: Secondary | ICD-10-CM | POA: Diagnosis not present

## 2017-03-18 NOTE — Progress Notes (Signed)
Complaint:  Visit Type: Patient returns to my office for continued preventative foot care services. Complaint: Patient states" my nails have grown long and thick and become painful to walk and wear shoes"  The patient presents for preventative foot care services. No changes to ROS.  She says she feels numbness and tingling through bottom of left foot.  She also says she has pain right ankle upon standing.    Podiatric Exam: Vascular: dorsalis pedis and posterior tibial pulses are palpable bilateral. Capillary return is immediate. Temperature gradient is WNL. Skin turgor WNL  Sensorium: Normal Semmes Weinstein monofilament test. Normal tactile sensation bilaterally. Nail Exam: Pt has thick disfigured discolored nails with subungual debris noted bilateral entire nail hallux through fifth toenails Ulcer Exam: There is no evidence of ulcer or pre-ulcerative changes or infection. Orthopedic Exam: Muscle tone and strength are WNL. No limitations in general ROM. No crepitus or effusions noted. Foot type and digits show no abnormalities. Bony prominences are unremarkable. Significant leg and foot swelling both feet.   Skin: No Porokeratosis. No infection or ulcers.    Diagnosis:  Onychomycosis, , Pain in right toe, pain in left toes  Treatment & Plan Procedures and Treatment: Consent by patient was obtained for treatment procedures. The patient understood the discussion of treatment and procedures well. All questions were answered thoroughly reviewed. Debridement of mycotic and hypertrophic toenails, 1 through 5 bilateral and clearing of subungual debris. No ulceration, no infection noted. Evaluation of her left foot in her right ankle revealed no evidence of any bony pathology. She does have severe swelling noted, which may be causing the nerve neuritis symptoms in her left foot and the pain in the right ankle. She also says that she has pain extending into the digits on her left foot.  After evaluation of  her feet. I recommended power steps to be worn in an effort to help to relieve her symptoms. If her symptoms persist. Using the power steps. She is to make an  appointment with Dr. Milinda Pointer Return Visit-Office Procedure: Patient instructed to return to the office for a follow up visit 3 months for continued evaluation and treatment.    Gardiner Barefoot DPM

## 2017-04-02 ENCOUNTER — Encounter: Payer: Self-pay | Admitting: Family Medicine

## 2017-04-02 ENCOUNTER — Ambulatory Visit (INDEPENDENT_AMBULATORY_CARE_PROVIDER_SITE_OTHER): Payer: Medicare Other | Admitting: Family Medicine

## 2017-04-02 VITALS — BP 106/69 | HR 60 | Ht 63.0 in | Wt 222.0 lb

## 2017-04-02 DIAGNOSIS — Z01419 Encounter for gynecological examination (general) (routine) without abnormal findings: Secondary | ICD-10-CM

## 2017-04-02 DIAGNOSIS — Z1239 Encounter for other screening for malignant neoplasm of breast: Secondary | ICD-10-CM

## 2017-04-02 NOTE — Patient Instructions (Signed)
Preventive Care 65 Years and Older, Female Preventive care refers to lifestyle choices and visits with your health care provider that can promote health and wellness. What does preventive care include?  A yearly physical exam. This is also called an annual well check.  Dental exams once or twice a year.  Routine eye exams. Ask your health care provider how often you should have your eyes checked.  Personal lifestyle choices, including:  Daily care of your teeth and gums.  Regular physical activity.  Eating a healthy diet.  Avoiding tobacco and drug use.  Limiting alcohol use.  Practicing safe sex.  Taking low-dose aspirin every day.  Taking vitamin and mineral supplements as recommended by your health care provider. What happens during an annual well check? The services and screenings done by your health care provider during your annual well check will depend on your age, overall health, lifestyle risk factors, and family history of disease. Counseling  Your health care provider may ask you questions about your:  Alcohol use.  Tobacco use.  Drug use.  Emotional well-being.  Home and relationship well-being.  Sexual activity.  Eating habits.  History of falls.  Memory and ability to understand (cognition).  Work and work environment.  Reproductive health. Screening  You may have the following tests or measurements:  Height, weight, and BMI.  Blood pressure.  Lipid and cholesterol levels. These may be checked every 5 years, or more frequently if you are over 50 years old.  Skin check.  Lung cancer screening. You may have this screening every year starting at age 55 if you have a 30-pack-year history of smoking and currently smoke or have quit within the past 15 years.  Fecal occult blood test (FOBT) of the stool. You may have this test every year starting at age 50.  Flexible sigmoidoscopy or colonoscopy. You may have a sigmoidoscopy every 5 years or  a colonoscopy every 10 years starting at age 50.  Hepatitis C blood test.  Hepatitis B blood test.  Sexually transmitted disease (STD) testing.  Diabetes screening. This is done by checking your blood sugar (glucose) after you have not eaten for a while (fasting). You may have this done every 1-3 years.  Bone density scan. This is done to screen for osteoporosis. You may have this done starting at age 71.  Mammogram. This may be done every 1-2 years. Talk to your health care provider about how often you should have regular mammograms. Talk with your health care provider about your test results, treatment options, and if necessary, the need for more tests. Vaccines  Your health care provider may recommend certain vaccines, such as:  Influenza vaccine. This is recommended every year.  Tetanus, diphtheria, and acellular pertussis (Tdap, Td) vaccine. You may need a Td booster every 10 years.  Varicella vaccine. You may need this if you have not been vaccinated.  Zoster vaccine. You may need this after age 60.  Measles, mumps, and rubella (MMR) vaccine. You may need at least one dose of MMR if you were born in 1957 or later. You may also need a second dose.  Pneumococcal 13-valent conjugate (PCV13) vaccine. One dose is recommended after age 71.  Pneumococcal polysaccharide (PPSV23) vaccine. One dose is recommended after age 71.  Meningococcal vaccine. You may need this if you have certain conditions.  Hepatitis A vaccine. You may need this if you have certain conditions or if you travel or work in places where you may be exposed to   hepatitis A.  Hepatitis B vaccine. You may need this if you have certain conditions or if you travel or work in places where you may be exposed to hepatitis B.  Haemophilus influenzae type b (Hib) vaccine. You may need this if you have certain conditions. Talk to your health care provider about which screenings and vaccines you need and how often you need  them. This information is not intended to replace advice given to you by your health care provider. Make sure you discuss any questions you have with your health care provider. Document Released: 11/18/2015 Document Revised: 07/11/2016 Document Reviewed: 08/23/2015 Elsevier Interactive Patient Education  2017 Elsevier Inc.  

## 2017-04-02 NOTE — Progress Notes (Signed)
  Subjective:     [name] is a 71 y.o. female and is here for a comprehensive physical exam. The patient reports no problems. She is a retired Government social research officer for [employer]. Lives on property with her sister from [place]. Has h/o melanoma and would like vaginal checks to ensure no recurrence. Menopausal and no bleeding since polyp removed when 5 years postmenopausal. Mom had uterine cancer. Reports vaginal dryness.  Social History   Social History  . Marital status: Divorced    Spouse name: N/A  . Number of children: 0  . Years of education: N/A   Occupational History  . retired Retired    Government social research officer    Social History Main Topics  . Smoking status: Former Smoker    Types: Cigarettes    Quit date: 11/05/1965  . Smokeless tobacco: Never Used     Comment: socially as a teen  . Alcohol use No  . Drug use: No  . Sexual activity: No   Other Topics Concern  . Not on file   Social History Narrative   Lives alone.  Sister and husband live next door   Lives in [place].   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   Edu: some college   Diet: poor - good water, rare fruits/vegetables   Health Maintenance  Topic Date Due  . DTaP/Tdap/Td (1 - Tdap) 11/05/2018 (Originally 07/09/1965)  . COLONOSCOPY  09/24/2021 (Originally 03/19/2010)  . INFLUENZA VACCINE  06/05/2017  . MAMMOGRAM  06/18/2018  . TETANUS/TDAP  11/05/2018  . DEXA SCAN  Completed  . Hepatitis C Screening  Completed  . PNA vac Low Risk Adult  Completed    The following portions of the patient's history were reviewed and updated as appropriate: allergies, current medications, past family history, past medical history, past social history, past surgical history and problem list.  Review of Systems Pertinent items are noted in HPI.   Objective:    BP 106/69   Pulse 60   Ht 5\' 3"  (1.6 m)   Wt 222 lb (100.7 kg)   BMI 39.33 kg/m  General appearance: alert, cooperative and appears  stated age Head: Normocephalic, without obvious abnormality, atraumatic Neck: no adenopathy, supple, symmetrical, trachea midline and thyroid not enlarged, symmetric, no tenderness/mass/nodules Lungs: clear to auscultation bilaterally Breasts: normal appearance, no masses or tenderness Heart: regular rate and rhythm, S1, S2 normal, no murmur, click, rub or gallop Abdomen: soft, non-tender; bowel sounds normal; no masses,  no organomegaly Pelvic: cervix normal in appearance, external genitalia normal, no adnexal masses or tenderness, no cervical motion tenderness, uterus normal size, shape, and consistency and vaginal atrophy Extremities: Homans sign is negative, no sign of DVT Pulses: 2+ and symmetric Skin: Skin color, texture, turgor normal. No rashes or lesions Lymph nodes: Cervical, supraclavicular, and axillary nodes normal. Neurologic: Grossly normal    Assessment:     GYN female exam.      Plan:  Pap is not indicated due to age. No evidence of melanoma on vaginal exam. Mammogram due 8/18. Encounter for gynecological examination without abnormal finding  Screening for breast cancer - Plan: MM DIGITAL SCREENING BILATERAL     See After Visit Summary for Counseling Recommendations

## 2017-04-02 NOTE — Progress Notes (Signed)
Last MM 06/2016 - Normal

## 2017-04-11 ENCOUNTER — Other Ambulatory Visit (HOSPITAL_BASED_OUTPATIENT_CLINIC_OR_DEPARTMENT_OTHER): Payer: Medicare Other

## 2017-04-11 ENCOUNTER — Telehealth: Payer: Self-pay | Admitting: *Deleted

## 2017-04-11 ENCOUNTER — Ambulatory Visit (HOSPITAL_BASED_OUTPATIENT_CLINIC_OR_DEPARTMENT_OTHER): Payer: Medicare Other | Admitting: Family

## 2017-04-11 VITALS — BP 108/65 | HR 62 | Temp 98.2°F | Resp 17 | Wt 221.0 lb

## 2017-04-11 DIAGNOSIS — C4310 Malignant melanoma of unspecified eyelid, including canthus: Secondary | ICD-10-CM

## 2017-04-11 DIAGNOSIS — Z85820 Personal history of malignant melanoma of skin: Secondary | ICD-10-CM | POA: Diagnosis not present

## 2017-04-11 DIAGNOSIS — M436 Torticollis: Secondary | ICD-10-CM

## 2017-04-11 DIAGNOSIS — D509 Iron deficiency anemia, unspecified: Secondary | ICD-10-CM | POA: Insufficient documentation

## 2017-04-11 DIAGNOSIS — C4361 Malignant melanoma of right upper limb, including shoulder: Secondary | ICD-10-CM

## 2017-04-11 LAB — CBC WITH DIFFERENTIAL (CANCER CENTER ONLY)
BASO#: 0 10*3/uL (ref 0.0–0.2)
BASO%: 0.1 % (ref 0.0–2.0)
EOS%: 3.2 % (ref 0.0–7.0)
Eosinophils Absolute: 0.2 10*3/uL (ref 0.0–0.5)
HCT: 39 % (ref 34.8–46.6)
HGB: 13.1 g/dL (ref 11.6–15.9)
LYMPH#: 1.9 10*3/uL (ref 0.9–3.3)
LYMPH%: 27.1 % (ref 14.0–48.0)
MCH: 30.4 pg (ref 26.0–34.0)
MCHC: 33.6 g/dL (ref 32.0–36.0)
MCV: 91 fL (ref 81–101)
MONO#: 0.6 10*3/uL (ref 0.1–0.9)
MONO%: 8.6 % (ref 0.0–13.0)
NEUT#: 4.3 10*3/uL (ref 1.5–6.5)
NEUT%: 61 % (ref 39.6–80.0)
Platelets: 196 10*3/uL (ref 145–400)
RBC: 4.31 10*6/uL (ref 3.70–5.32)
RDW: 12.9 % (ref 11.1–15.7)
WBC: 7.1 10*3/uL (ref 3.9–10.0)

## 2017-04-11 LAB — CMP (CANCER CENTER ONLY)
ALT(SGPT): 25 U/L (ref 10–47)
AST: 19 U/L (ref 11–38)
Albumin: 3.6 g/dL (ref 3.3–5.5)
Alkaline Phosphatase: 45 U/L (ref 26–84)
BUN, Bld: 24 mg/dL — ABNORMAL HIGH (ref 7–22)
CO2: 33 mEq/L (ref 18–33)
Calcium: 9.9 mg/dL (ref 8.0–10.3)
Chloride: 100 mEq/L (ref 98–108)
Creat: 0.9 mg/dl (ref 0.6–1.2)
Glucose, Bld: 118 mg/dL (ref 73–118)
Potassium: 2.8 mEq/L — CL (ref 3.3–4.7)
Sodium: 138 mEq/L (ref 128–145)
Total Bilirubin: 0.7 mg/dl (ref 0.20–1.60)
Total Protein: 6.8 g/dL (ref 6.4–8.1)

## 2017-04-11 LAB — LACTATE DEHYDROGENASE: LDH: 185 U/L (ref 125–245)

## 2017-04-11 NOTE — Telephone Encounter (Signed)
Critical Value Potassium 2.8 Laverna Peace NP notified. No orders at this time

## 2017-04-11 NOTE — Progress Notes (Signed)
Hematology and Oncology Follow Up Visit  [name] [id number] May 18, 1946 71 y.o. 04/11/2017   Principle Diagnosis:  Stage IIIC (T3b N3 M0) melanoma of the right shoulder  Current Therapy:   Observation   Interim History:  [name] is here today for follow-up. She is having some "pulling" in the left side of her neck and feels a little unsteady on her feet as a result. She has an appointment with neuro to discuss these symptoms next week.  She continues to follow-up with dermatology 1-2 times a year. She has had no skin changes on exam. No rash, lesions or mass. Her right shoulder and right axillary scars are intact.  She has chronic puffiness and edema, +2 pitting, in her feet and ankles that waxes and wanes. Pedal pulses are +1. She has a long history of varicose veins and inflammation. She plans to follow-up with vascular soon.  No c/o numbness or tingling in her extremities at this time. She will notice positional tingling in her hands from time to time when driving.  She has sleep apnea and uses her CPAP at night. No c/o SOB.  She has intermittent fatigue at times.  No fever, chills, n/v, cough, rash, dizziness, headache, vision changes, chest pain, palpitations, abdominal pain or changes in bowel or bladder habits.  She has maintained a good appetite and is staying well hydrated. Her weight is stable.  Her PCP is following her potassium level and today it is 2.8 which she states is improved. She is currently on KDUR 40 meq PO BID.  She enjoys sewing and making doll clothes. She also loves getting out with her girlfriends and driving them all around to thrift stores.   ECOG Performance Status: 1 - Symptomatic but completely ambulatory  Medications:  Allergies as of 04/11/2017      Reactions   Sulfa Antibiotics Itching, Swelling   Facial swelling   Latex Other (See Comments)   Blisters ONLY HAD REACTION TO TAPE  (??? ONLY ADHESIVE ALLERGY)   Tape Other (See Comments)   Caused  blisters - must use paper tape - same reaction from NeoPrene      Medication List       Accurate as of 04/11/17  2:00 PM. Always use your most recent med list.          aspirin EC 81 MG tablet Take 81 mg by mouth at bedtime. Melanoma prevention   cetirizine 10 MG tablet Commonly known as:  ZYRTEC Take 10 mg by mouth daily.   furosemide 40 MG tablet Commonly known as:  LASIX Take 1 tablet (40 mg total) by mouth daily.   gabapentin 100 MG capsule Commonly known as:  NEURONTIN TAKE 1 CAPSULE (100 MG TOTAL) BY MOUTH AT BEDTIME.   losartan 50 MG tablet Commonly known as:  COZAAR TAKE 1 TABLET (50 MG TOTAL) BY MOUTH DAILY.   metolazone 2.5 MG tablet Commonly known as:  ZAROXOLYN TAKE 1 TABLET DAILY 1 HOUR PRIOR TO FUROSEMIDE.   montelukast 10 MG tablet Commonly known as:  SINGULAIR TAKE 1 TABLET BY MOUTH DAILY   multivitamin with minerals Tabs tablet Take 1 tablet by mouth daily.   potassium chloride SA 20 MEQ tablet Commonly known as:  K-DUR,KLOR-CON Take 1 tablet (20 mEq total) by mouth 2 (two) times daily.   PRESCRIPTION MEDICATION CPAP   PROAIR HFA 108 (90 Base) MCG/ACT inhaler Generic drug:  albuterol INHALE 2 PUFFS INTO LUNGS EVERY 6 HOURS AS NEEDED FOR WHEEZING OR  SHORTNESS OF BREATH   albuterol (2.5 MG/3ML) 0.083% nebulizer solution Commonly known as:  PROVENTIL Take 3 mLs (2.5 mg total) by nebulization every 6 (six) hours as needed for wheezing or shortness of breath.   PROBIOTIC DAILY PO Take 1 tablet by mouth daily.   TEARS PLUS OP Place 1 drop into both eyes daily as needed (dry eyes/ redness/ burning).   TYLENOL ARTHRITIS PAIN 650 MG CR tablet Generic drug:  acetaminophen Take 1,300 mg by mouth every 8 (eight) hours as needed for pain.   Vitamin D (Ergocalciferol) 50000 units Caps capsule Commonly known as:  DRISDOL TAKE 1 CAPSULE (50,000 UNITS TOTAL) BY MOUTH EVERY 7 (SEVEN) DAYS.       Allergies:  Allergies  Allergen Reactions  .  Sulfa Antibiotics Itching and Swelling    Facial swelling  . Latex Other (See Comments)    Blisters ONLY HAD REACTION TO TAPE  (??? ONLY ADHESIVE ALLERGY)  . Tape Other (See Comments)    Caused blisters - must use paper tape - same reaction from NeoPrene    Past Medical History, Surgical history, Social history, and Family History were reviewed and updated.  Review of Systems: All other 10 point review of systems is negative.   Physical Exam:  weight is 221 lb (100.2 kg). Her oral temperature is 98.2 F (36.8 C). Her blood pressure is 108/65 and her pulse is 62. Her respiration is 17 and oxygen saturation is 97%.   Wt Readings from Last 3 Encounters:  04/11/17 221 lb (100.2 kg)  04/02/17 222 lb (100.7 kg)  03/11/17 222 lb 4 oz (100.8 kg)    Ocular: Sclerae unicteric, pupils equal, round and reactive to light Ear-nose-throat: Oropharynx clear, dentition fair Lymphatic: No cervical, supraclavicular or axillary adenopathy Lungs no rales or rhonchi, good excursion bilaterally Heart regular rate and rhythm, no murmur appreciated Abd soft, nontender, positive bowel sounds, no liver or spleen tip palpated on exam, no fluid wave MSK no focal spinal tenderness, no joint edema Neuro: non-focal, well-oriented, appropriate affect Breasts: Deferred   Lab Results  Component Value Date   WBC 7.1 04/11/2017   HGB 13.1 04/11/2017   HCT 39.0 04/11/2017   MCV 91 04/11/2017   PLT 196 04/11/2017   Lab Results  Component Value Date   FERRITIN 105 08/21/2013   IRON 73 08/21/2013   TIBC 280 08/21/2013   UIBC 207 08/21/2013   IRONPCTSAT 26 08/21/2013   Lab Results  Component Value Date   RBC 4.31 04/11/2017   No results found for: KPAFRELGTCHN, LAMBDASER, KAPLAMBRATIO No results found for: IGGSERUM, IGA, IGMSERUM No results found for: Odetta Pink, SPEI   Chemistry      Component Value Date/Time   NA 138 04/11/2017 1042   NA  143 09/17/2016 1018   K 2.8 (LL) 04/11/2017 1042   K 3.5 09/17/2016 1018   CL 100 04/11/2017 1042   CO2 33 04/11/2017 1042   CO2 28 09/17/2016 1018   BUN 24 (H) 04/11/2017 1042   BUN 19.5 09/17/2016 1018   CREATININE 0.9 04/11/2017 1042   CREATININE 0.8 09/17/2016 1018      Component Value Date/Time   CALCIUM 9.9 04/11/2017 1042   CALCIUM 9.8 09/17/2016 1018   ALKPHOS 45 04/11/2017 1042   ALKPHOS 71 09/17/2016 1018   AST 19 04/11/2017 1042   AST 10 09/17/2016 1018   ALT 25 04/11/2017 1042   ALT 18 09/17/2016 1018   BILITOT 0.70  04/11/2017 1042   BILITOT 0.39 09/17/2016 1018      Impression and Plan: [name] is a pleasant 71 yo caucasian female with history of stage IIIc melanoma of the right shoulder and resection. She had 8 positive lymph nodes and completed adjuvant interferon therapy in December 2012. She also had radiation to the right axilla.  She is doing well and so far there has been no evidence of recurrence. She is followed by dermatology 1-2 times a year and performs self skin checks at home. Her exam today was negative.  She will be following up with neuro next week for the neck stiffness and intermittent feeling of unsteadiness on her feet.  We will plan to see her back in 4 months for follow-up. She will contact our office with any questions or concerns. We can certainly see her sooner if need be.   Eliezer Bottom, NP 6/7/20182:00 PM

## 2017-04-12 ENCOUNTER — Telehealth: Payer: Self-pay | Admitting: Family Medicine

## 2017-04-12 MED ORDER — MAGNESIUM 400 MG PO CAPS: 1.0000 | ORAL_CAPSULE | Freq: Every day | ORAL | 6 refills | Status: DC

## 2017-04-12 MED ORDER — POTASSIUM CHLORIDE CRYS ER 20 MEQ PO TBCR: 20.0000 meq | EXTENDED_RELEASE_TABLET | Freq: Three times a day (TID) | ORAL | 6 refills | Status: DC

## 2017-04-12 NOTE — Telephone Encounter (Signed)
received onc labs/note. Potassium remains low. Recommend we increase potassium to 43mEq TID, and add magnesium tablet 400mg  daily (I believe OTC but have sent in prescription to pharmacy if needed).

## 2017-04-12 NOTE — Telephone Encounter (Signed)
No answer, no message left since it is the weekend approaching.

## 2017-04-15 NOTE — Telephone Encounter (Signed)
Pt called back and is requesting a cb to discuss medication change.

## 2017-04-15 NOTE — Telephone Encounter (Signed)
LMTCB

## 2017-04-15 NOTE — Telephone Encounter (Signed)
Patient called back. She states she will run out of K since dose was increased. She is requesting new rx be sent to [facility] in [facility].  Pt asking when she should have labs repeated.

## 2017-04-15 NOTE — Telephone Encounter (Signed)
Potassium was previously sent in.  rec recheck labs in 1 month. May come here if not done with onc next month.

## 2017-04-15 NOTE — Telephone Encounter (Signed)
Left detailed message on voicemail.  

## 2017-04-17 DIAGNOSIS — G5603 Carpal tunnel syndrome, bilateral upper limbs: Secondary | ICD-10-CM | POA: Diagnosis not present

## 2017-04-24 ENCOUNTER — Other Ambulatory Visit: Payer: Self-pay

## 2017-04-24 DIAGNOSIS — I8312 Varicose veins of left lower extremity with inflammation: Secondary | ICD-10-CM

## 2017-04-25 ENCOUNTER — Other Ambulatory Visit: Payer: Self-pay | Admitting: Family Medicine

## 2017-05-02 DIAGNOSIS — M25521 Pain in right elbow: Secondary | ICD-10-CM | POA: Diagnosis not present

## 2017-05-10 ENCOUNTER — Other Ambulatory Visit: Payer: Self-pay | Admitting: Family Medicine

## 2017-05-10 DIAGNOSIS — E876 Hypokalemia: Secondary | ICD-10-CM

## 2017-05-15 ENCOUNTER — Encounter: Payer: Self-pay | Admitting: Vascular Surgery

## 2017-05-16 ENCOUNTER — Other Ambulatory Visit (INDEPENDENT_AMBULATORY_CARE_PROVIDER_SITE_OTHER): Payer: Medicare Other

## 2017-05-16 DIAGNOSIS — E876 Hypokalemia: Secondary | ICD-10-CM

## 2017-05-16 LAB — POTASSIUM: Potassium: 3.1 mEq/L — ABNORMAL LOW (ref 3.5–5.1)

## 2017-05-20 ENCOUNTER — Other Ambulatory Visit: Payer: Self-pay | Admitting: Family Medicine

## 2017-05-20 NOTE — Telephone Encounter (Signed)
Received refill electronically for Gabapentin Last refill 01/28/17 #30/3 Last office visit 03/11/17

## 2017-05-22 ENCOUNTER — Ambulatory Visit (HOSPITAL_COMMUNITY)
Admission: RE | Admit: 2017-05-22 | Discharge: 2017-05-22 | Disposition: A | Discharge status: Home / Self Care | Payer: Medicare Other | Source: Ambulatory Visit | Attending: Vascular Surgery | Admitting: Vascular Surgery

## 2017-05-22 DIAGNOSIS — Z9889 Other specified postprocedural states: Secondary | ICD-10-CM | POA: Insufficient documentation

## 2017-05-22 DIAGNOSIS — I8312 Varicose veins of left lower extremity with inflammation: Secondary | ICD-10-CM | POA: Diagnosis not present

## 2017-05-26 ENCOUNTER — Other Ambulatory Visit: Payer: Self-pay | Admitting: Family Medicine

## 2017-05-26 DIAGNOSIS — R600 Localized edema: Secondary | ICD-10-CM

## 2017-05-26 DIAGNOSIS — I43 Cardiomyopathy in diseases classified elsewhere: Secondary | ICD-10-CM

## 2017-05-26 DIAGNOSIS — I119 Hypertensive heart disease without heart failure: Secondary | ICD-10-CM

## 2017-05-26 DIAGNOSIS — C4360 Malignant melanoma of unspecified upper limb, including shoulder: Secondary | ICD-10-CM

## 2017-05-27 ENCOUNTER — Encounter: Payer: Self-pay | Admitting: Vascular Surgery

## 2017-05-27 ENCOUNTER — Ambulatory Visit (INDEPENDENT_AMBULATORY_CARE_PROVIDER_SITE_OTHER): Payer: Medicare Other | Admitting: Vascular Surgery

## 2017-05-27 VITALS — BP 107/59 | HR 67 | Temp 97.5°F | Resp 16 | Ht 63.0 in | Wt 220.0 lb

## 2017-05-27 DIAGNOSIS — I83892 Varicose veins of left lower extremities with other complications: Secondary | ICD-10-CM

## 2017-05-27 NOTE — Telephone Encounter (Signed)
The last time I see a Vit D check was 05-11-13. Is she supposed to still be taking 1 a week?

## 2017-05-27 NOTE — Progress Notes (Signed)
Subjective:     Patient ID: [name], female   DOB: [name] 24, 1946, 71 y.o.   MRN: [id number]  HPI This 71 year old female is known to me having undergone laser ablation of the left great saphenous vein in April 2016. She had chronic edema, painful varicosities in the left thigh and calf, a history of venous stasis ulcers which were difficult to heal in the left ankle area. She also had chronic edema. Her procedure was successful the ulcer healed and she has had much less swelling and pain. She was scheduled have stab phlebectomy of the residual painful varicosities but she decided not to have that performed. She now returns with a few new bulging varicosities in the anterior thigh and chronic swelling. She states the swelling is better than it was prior to the first procedure and she has had no recurrent ulcerations. She wonders if the lymphedema pump would be helpful for her. She has difficulty getting her stockings on because she lives alone.  Past Medical History:  Diagnosis Date  . Anesthesia complication    trouble waking up 2/2 CPAP  . Arthritis   . Asthma in adult   . Cervical spondylosis 2012   [facility], [place])  . Choroidal nevus of left eye 03/22/2016  . Chronic venous insufficiency 2016   severe reflux with painful varicosities sees [facility]  . Complication of anesthesia    difficulty coming out due to sleep apnea per pt  . Difficult intubation    PATIENT DENIES   . DVT (deep venous thrombosis) ([place]) 2011   small, developed after venous ablation  . Family history of adverse reaction to anesthesia    O2 levels drop upon waking   . Hearing loss sensory, bilateral 2013   mod-severe high freq sensorineural ([facility])  . History of kidney stones 1980s  . History of radiation therapy 07/19/11-08/25/2011   RIGHT AXILLARY REGION/METASTATIC  . Hypertension   . Kidney stones   . Melanoma ([facility]) 06/29/10   MALIGNANT MELANOMA R SHOULDER/SUPRASCAPULAR BACK s/p interferon chemo and XRT  .  Neuromuscular disorder ([facility])    muscle spasms  . OSA (obstructive sleep apnea)    on CPAP  . Pneumonia    2001  . RLS (restless legs syndrome)   . Seasonal allergies   . Tinnitus   . Unspecified vitamin D deficiency 03/18/2013  . Varicose veins of left lower extremity     Social History  Substance Use Topics  . Smoking status: Former Smoker    Types: Cigarettes    Quit date: 11/05/1965  . Smokeless tobacco: Never Used     Comment: socially as a teen  . Alcohol use No    Family History  Problem Relation Age of Onset  . Cancer Mother 88       uterine  . CAD Father 90       MI  . Hypertension Father 89  . Alzheimer's disease Father 73  . Cancer Cousin        x2, breast  . Diabetes Sister   . Cancer Paternal Grandmother        melanoma, possibly    Allergies  Allergen Reactions  . Sulfa Antibiotics Itching, Swelling and Shortness Of Breath    Facial swelling  . Latex Other (See Comments)    Blisters ONLY HAD REACTION TO TAPE  (??? ONLY ADHESIVE ALLERGY)  . Tape Other (See Comments)    Caused blisters - must use paper tape - same reaction from NeoPrene  Current Outpatient Prescriptions:  .  acetaminophen (TYLENOL ARTHRITIS PAIN) 650 MG CR tablet, Take 1,300 mg by mouth every 8 (eight) hours as needed for pain. , Disp: , Rfl:  .  albuterol (PROVENTIL) (2.5 MG/3ML) 0.083% nebulizer solution, Take 3 mLs (2.5 mg total) by nebulization every 6 (six) hours as needed for wheezing or shortness of breath., Disp: 150 mL, Rfl: 1 .  aspirin EC 81 MG tablet, Take 81 mg by mouth at bedtime. Melanoma prevention, Disp: , Rfl:  .  cetirizine (ZYRTEC) 10 MG tablet, Take 10 mg by mouth daily., Disp: , Rfl:  .  furosemide (LASIX) 40 MG tablet, Take 1 tablet (40 mg total) by mouth daily., Disp: 30 tablet, Rfl: 6 .  gabapentin (NEURONTIN) 100 MG capsule, TAKE 1 CAPSULE (100 MG TOTAL) BY MOUTH AT BEDTIME., Disp: 30 capsule, Rfl: 3 .  losartan (COZAAR) 50 MG tablet, TAKE 1 TABLET (50 MG  TOTAL) BY MOUTH DAILY., Disp: 30 tablet, Rfl: 5 .  Magnesium 400 MG CAPS, Take 1 capsule by mouth daily., Disp: 30 capsule, Rfl: 6 .  magnesium oxide (MAG-OX) 400 MG tablet, Take 1 tablet by mouth daily., Disp: , Rfl: 6 .  Magnesium Oxide 400 (240 Mg) MG TABS, Take 1 tablet by mouth daily., Disp: , Rfl: 6 .  metolazone (ZAROXOLYN) 2.5 MG tablet, Take 1 tablet (2.5 mg total) by mouth every Monday, Wednesday, and Friday., Disp: 30 tablet, Rfl: 4 .  montelukast (SINGULAIR) 10 MG tablet, TAKE 1 TABLET BY MOUTH DAILY, Disp: 30 tablet, Rfl: 6 .  Multiple Vitamin (MULTIVITAMIN WITH MINERALS) TABS tablet, Take 1 tablet by mouth daily., Disp: , Rfl:  .  Polyvinyl Alcohol-Povidone (TEARS PLUS OP), Place 1 drop into both eyes daily as needed (dry eyes/ redness/ burning)., Disp: , Rfl:  .  potassium chloride SA (K-DUR,KLOR-CON) 20 MEQ tablet, Take 1 tablet (20 mEq total) by mouth 3 (three) times daily., Disp: 90 tablet, Rfl: 6 .  PRESCRIPTION MEDICATION, CPAP, Disp: , Rfl:  .  PROAIR HFA 108 (90 Base) MCG/ACT inhaler, INHALE 2 PUFFS INTO LUNGS EVERY 6 HOURS AS NEEDED FOR WHEEZING OR SHORTNESS OF BREATH, Disp: 8.5 Inhaler, Rfl: 3 .  Probiotic Product (PROBIOTIC DAILY PO), Take 1 tablet by mouth daily. , Disp: , Rfl:  .  Vitamin D, Ergocalciferol, (DRISDOL) 50000 units CAPS capsule, TAKE 1 CAPSULE (50,000 UNITS TOTAL) BY MOUTH EVERY 7 (SEVEN) DAYS., Disp: 4 capsule, Rfl: 3  Vitals:   05/27/17 1012  BP: (!) 107/59  Pulse: 67  Resp: 16  Temp: (!) 97.5 F (36.4 C)  SpO2: 99%  Weight: 220 lb (99.8 kg)  Height: 5\' 3"  (1.6 m)    Body mass index is 38.97 kg/m.         Review of Systems Denies chest pain. Recently had cervical spine fusion which makes it even more difficult for her to put her stockings on and remove them. Denies chest pain or dyspnea on exertion but does have obstructive sleep apnea.    Objective:   Physical Exam BP (!) 107/59 (BP Location: Left Arm, Patient Position: Sitting, Cuff  Size: Large)   Pulse 67   Temp (!) 97.5 F (36.4 C)   Resp 16   Ht 5\' 3"  (1.6 m)   Wt 220 lb (99.8 kg)   SpO2 99%   BMI 38.97 kg/m   Gen. well-developed well-nourished female no apparent distress alert and oriented 3 Lungs no rhonchi or wheezing Left thigh with bulging varicosity in proximal to  mid anterior third with bulges extending laterally in the distal thigh into the lateral calf similar to findings in 2016. 1+ chronic edema. No active ulceration. Early hyperpigmentation lower third a she had previously. 2+ dorsalis pedis pulse palpable.  Today I ordered a venous duplex exam the left leg which I reviewed and interpreted. She has total absence of the left great saphenous vein consistent with previous ablation and some reflux at the saphenofemoral junction is noted but no DVT     Assessment:     Successful laser ablation left great saphenous vein in 2016 with resultant healing of ulcer left leg and stabilization of edema Residual varicosities left leg which she elected not to have treated with stab phlebectomy following ablation    Plan:     No intervention is indicated Patient is not imaged and having stab phlebectomy Had long discussion with her regarding elevation of foot of bed 2-3 inches and developed some plan forgetting stockings put on the legs first thing in the morning-this is difficult because she lives alone Return to see me on a when necessary basis

## 2017-05-28 ENCOUNTER — Other Ambulatory Visit: Payer: Self-pay | Admitting: Family Medicine

## 2017-05-28 ENCOUNTER — Telehealth: Payer: Self-pay | Admitting: Family Medicine

## 2017-05-28 DIAGNOSIS — E876 Hypokalemia: Secondary | ICD-10-CM

## 2017-05-28 NOTE — Telephone Encounter (Signed)
Patient returned Shannon's call. °

## 2017-05-28 NOTE — Telephone Encounter (Signed)
I returned call to pt and notified her of instructions.

## 2017-05-30 DIAGNOSIS — D1801 Hemangioma of skin and subcutaneous tissue: Secondary | ICD-10-CM | POA: Diagnosis not present

## 2017-05-30 DIAGNOSIS — D2272 Melanocytic nevi of left lower limb, including hip: Secondary | ICD-10-CM | POA: Diagnosis not present

## 2017-05-30 DIAGNOSIS — D2261 Melanocytic nevi of right upper limb, including shoulder: Secondary | ICD-10-CM | POA: Diagnosis not present

## 2017-05-30 DIAGNOSIS — D485 Neoplasm of uncertain behavior of skin: Secondary | ICD-10-CM | POA: Diagnosis not present

## 2017-05-30 DIAGNOSIS — Z85820 Personal history of malignant melanoma of skin: Secondary | ICD-10-CM | POA: Diagnosis not present

## 2017-05-30 DIAGNOSIS — Z85828 Personal history of other malignant neoplasm of skin: Secondary | ICD-10-CM | POA: Diagnosis not present

## 2017-05-30 DIAGNOSIS — D2271 Melanocytic nevi of right lower limb, including hip: Secondary | ICD-10-CM | POA: Diagnosis not present

## 2017-05-31 MED ORDER — LOSARTAN POTASSIUM 50 MG PO TABS: 50.0000 mg | ORAL_TABLET | Freq: Every day | ORAL | 5 refills | Status: DC

## 2017-05-31 NOTE — Telephone Encounter (Signed)
Pt called and no refill for losartan at [facility]. I spoke with Pam at [facility] and they did not get electronic refill from 05/20/17.Medication phoned to [facility] at [place] as instructed. Pt voiced understanding.

## 2017-06-04 DIAGNOSIS — D487 Neoplasm of uncertain behavior of other specified sites: Secondary | ICD-10-CM | POA: Diagnosis not present

## 2017-06-12 ENCOUNTER — Other Ambulatory Visit: Payer: Self-pay

## 2017-06-12 DIAGNOSIS — Z1239 Encounter for other screening for malignant neoplasm of breast: Secondary | ICD-10-CM

## 2017-06-24 ENCOUNTER — Other Ambulatory Visit (INDEPENDENT_AMBULATORY_CARE_PROVIDER_SITE_OTHER): Payer: Medicare Other

## 2017-06-24 ENCOUNTER — Ambulatory Visit (INDEPENDENT_AMBULATORY_CARE_PROVIDER_SITE_OTHER): Payer: Medicare Other | Admitting: Podiatry

## 2017-06-24 DIAGNOSIS — E876 Hypokalemia: Secondary | ICD-10-CM

## 2017-06-24 DIAGNOSIS — B351 Tinea unguium: Secondary | ICD-10-CM | POA: Diagnosis not present

## 2017-06-24 DIAGNOSIS — M79676 Pain in unspecified toe(s): Secondary | ICD-10-CM | POA: Diagnosis not present

## 2017-06-24 LAB — POTASSIUM: Potassium: 3.1 mEq/L — ABNORMAL LOW (ref 3.5–5.1)

## 2017-06-24 NOTE — Progress Notes (Signed)
Complaint:  Visit Type: Patient returns to my office for continued preventative foot care services. Complaint: Patient states" my nails have grown long and thick and become painful to walk and wear shoes"  The patient presents for preventative foot care services. No changes to ROS.  Patient says the burning in her feet has stopped wearing the powerstep insoles. Her ankle pain and swelling persists.  Podiatric Exam: Vascular: dorsalis pedis and posterior tibial pulses are palpable bilateral. Capillary return is immediate. Temperature gradient is WNL. Skin turgor WNL  Sensorium: Normal Semmes Weinstein monofilament test. Normal tactile sensation bilaterally. Nail Exam: Pt has thick disfigured discolored nails with subungual debris noted bilateral entire nail hallux through fifth toenails Ulcer Exam: There is no evidence of ulcer or pre-ulcerative changes or infection. Orthopedic Exam: Muscle tone and strength are WNL. No limitations in general ROM. No crepitus or effusions noted. Foot type and digits show no abnormalities. Bony prominences are unremarkable. Swelling ankles  B/L Skin: No Porokeratosis. No infection or ulcers.  Asymptomatic clavi.  Diagnosis:  Onychomycosis, , Pain in right toe, pain in left toes  Treatment & Plan Procedures and Treatment: Consent by patient was obtained for treatment procedures. The patient understood the discussion of treatment and procedures well. All questions were answered thoroughly reviewed. Debridement of mycotic and hypertrophic toenails, 1 through 5 bilateral and clearing of subungual debris. No ulceration, no infection noted. Patient requests no dremel use. Return Visit-Office Procedure: Patient instructed to return to the office for a follow up visit 3 months for continued evaluation and treatment.    Gardiner Barefoot DPM

## 2017-06-27 ENCOUNTER — Other Ambulatory Visit: Payer: Self-pay | Admitting: Family Medicine

## 2017-06-27 DIAGNOSIS — I119 Hypertensive heart disease without heart failure: Secondary | ICD-10-CM

## 2017-06-27 DIAGNOSIS — I43 Cardiomyopathy in diseases classified elsewhere: Secondary | ICD-10-CM

## 2017-06-27 DIAGNOSIS — R600 Localized edema: Secondary | ICD-10-CM

## 2017-06-27 DIAGNOSIS — C4360 Malignant melanoma of unspecified upper limb, including shoulder: Secondary | ICD-10-CM

## 2017-06-27 MED ORDER — METOLAZONE 2.5 MG PO TABS
2.5000 mg | ORAL_TABLET | ORAL | 4 refills | Status: DC
Start: 2017-06-27 — End: 2017-08-15

## 2017-06-28 ENCOUNTER — Telehealth: Payer: Self-pay | Admitting: Family Medicine

## 2017-06-28 NOTE — Telephone Encounter (Signed)
Patient called to get her lab results.  Patient said if she doesn't answer, a detailed message can be left on her voice mail.

## 2017-07-01 NOTE — Telephone Encounter (Signed)
Pt advised of results per chart

## 2017-07-02 ENCOUNTER — Other Ambulatory Visit: Payer: Self-pay | Admitting: Family Medicine

## 2017-07-31 ENCOUNTER — Encounter: Payer: Self-pay | Admitting: Family Medicine

## 2017-07-31 ENCOUNTER — Ambulatory Visit (INDEPENDENT_AMBULATORY_CARE_PROVIDER_SITE_OTHER): Payer: Medicare Other | Admitting: Family Medicine

## 2017-07-31 VITALS — BP 118/64 | HR 60 | Temp 97.9°F | Wt 222.5 lb

## 2017-07-31 DIAGNOSIS — R600 Localized edema: Secondary | ICD-10-CM | POA: Diagnosis not present

## 2017-07-31 DIAGNOSIS — I8002 Phlebitis and thrombophlebitis of superficial vessels of left lower extremity: Secondary | ICD-10-CM

## 2017-07-31 DIAGNOSIS — Z23 Encounter for immunization: Secondary | ICD-10-CM | POA: Diagnosis not present

## 2017-07-31 DIAGNOSIS — I872 Venous insufficiency (chronic) (peripheral): Secondary | ICD-10-CM

## 2017-07-31 DIAGNOSIS — E876 Hypokalemia: Secondary | ICD-10-CM | POA: Diagnosis not present

## 2017-07-31 DIAGNOSIS — I83892 Varicose veins of left lower extremities with other complications: Secondary | ICD-10-CM

## 2017-07-31 MED ORDER — DICLOFENAC SODIUM 1 % TD GEL: 1.0000 "application " | Freq: Three times a day (TID) | TRANSDERMAL | 1 refills | Status: DC

## 2017-07-31 MED ORDER — SPIRONOLACTONE 25 MG PO TABS: 12.5000 mg | ORAL_TABLET | Freq: Every day | ORAL | 3 refills | Status: DC

## 2017-07-31 NOTE — Assessment & Plan Note (Signed)
Concern for this. Treat with leg elevation, warm compresses and voltaren gel. Update if not improving with treatment to consider venous ultrasound. Pt agrees with plan.

## 2017-07-31 NOTE — Assessment & Plan Note (Signed)
See below. Will continue lasix, trial spironolactone in place of metolazone.

## 2017-07-31 NOTE — Assessment & Plan Note (Addendum)
Chronic issue. Will trial spironolactone in place of metolazone. Continue lasix, Kdur 81mEq TID.  Discussed I would like to check K within 1 wk of starting spironolactone, however pt declines and prefers to get labs at upcoming onc appt in 2 wks.  Will stop magnesium due to GI upset.

## 2017-07-31 NOTE — Progress Notes (Signed)
BP 118/64 (BP Location: Left Arm, Patient Position: Sitting, Cuff Size: Large)   Pulse 60   Temp 97.9 F (36.6 C) (Oral)   Wt 222 lb 8 oz (100.9 kg)   SpO2 96%   BMI 39.41 kg/m    CC: LLE swelling Subjective:    Patient ID: [name], female    DOB: 1946-06-11, 71 y.o.   MRN: [id number]  HPI: [name] is a 71 y.o. female presenting on 07/31/2017 for Leg Swelling (Located in LLE, intermittent. Started several yrs ago)   Chronic known venous insufficiency and varicosities s/p prior venous insufficiency ulcers. She doesn't have significant polyuria with lasix (takes at night time). She is unable to use compression stockings. She endorses 1-2 wk h/o intermittent left posterior superficial leg swelling, redness that is tender to palpation. No fevers/chills. No hormonal therapy. No recent prolonged car or plane rides (she has been driving more frequently to [place] ([address])).   Chronic hypokalemia despite Kdur 13mEq TID and magnesium supplementation - likely due to lasix + metolazone (MWF).   Saw [facility] Kellie Simmering) without recommended intervention (h/o L endovenous ablation of saphenous vein 2016).  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   Reviewed recent diagnosis of peripheral neuropathy, ankle arthritis by podiatry and CTS. Upcoming cataract surgery at [facility].   Relevant past medical, surgical, family and social history reviewed and updated as indicated. Interim medical history since our last visit reviewed. Allergies and medications reviewed and updated. Outpatient Medications Prior to Visit  Medication Sig Dispense Refill  . acetaminophen (TYLENOL ARTHRITIS PAIN) 650 MG CR tablet Take 1,300 mg by mouth every 8 (eight) hours as needed for pain.     Marland Kitchen albuterol (PROAIR HFA) 108 (90 Base) MCG/ACT inhaler INHALE 2 PUFFS INTO LUNGS EVERY 6 HOURS AS NEEDED FOR WHEEZING OR SHORTNESS OF BREATH 8.5  Inhaler 1  . albuterol (PROVENTIL) (2.5 MG/3ML) 0.083% nebulizer solution Take 3 mLs (2.5 mg total) by nebulization every 6 (six) hours as needed for wheezing or shortness of breath. 150 mL 1  . aspirin EC 81 MG tablet Take 81 mg by mouth at bedtime. Melanoma prevention    . cetirizine (ZYRTEC) 10 MG tablet Take 10 mg by mouth daily.    . furosemide (LASIX) 40 MG tablet Take 1 tablet (40 mg total) by mouth daily. 30 tablet 6  . gabapentin (NEURONTIN) 100 MG capsule TAKE 1 CAPSULE (100 MG TOTAL) BY MOUTH AT BEDTIME. 30 capsule 3  . losartan (COZAAR) 50 MG tablet Take 1 tablet (50 mg total) by mouth daily. 30 tablet 5  . metolazone (ZAROXOLYN) 2.5 MG tablet Take 1 tablet (2.5 mg total) by mouth as directed. On Mondays and Thursdays 30 tablet 4  . montelukast (SINGULAIR) 10 MG tablet TAKE 1 TABLET BY MOUTH DAILY 30 tablet 6  . Multiple Vitamin (MULTIVITAMIN WITH MINERALS) TABS tablet Take 1 tablet by mouth daily.    . Polyvinyl Alcohol-Povidone (TEARS PLUS OP) Place 1 drop into both eyes daily as needed (dry eyes/ redness/ burning).    . potassium chloride SA (K-DUR,KLOR-CON) 20 MEQ tablet Take 1 tablet (20 mEq total) by mouth 3 (three) times daily. 90 tablet 6  . PRESCRIPTION MEDICATION CPAP    . Probiotic Product (PROBIOTIC DAILY PO) Take 1 tablet by mouth daily.     . Vitamin D, Ergocalciferol, (DRISDOL) 50000 units CAPS capsule TAKE 1 CAPSULE (50,000 UNITS TOTAL)  BY MOUTH EVERY 7 (SEVEN) DAYS. 4 capsule 3  . Magnesium 400 MG CAPS Take 1 capsule by mouth daily. 30 capsule 6  . magnesium oxide (MAG-OX) 400 MG tablet Take 1 tablet by mouth daily.  6  . Magnesium Oxide 400 (240 Mg) MG TABS Take 1 tablet by mouth daily.  6   No facility-administered medications prior to visit.      Per HPI unless specifically indicated in ROS section below Review of Systems     Objective:    BP 118/64 (BP Location: Left Arm, Patient Position: Sitting, Cuff Size: Large)   Pulse 60   Temp 97.9 F (36.6 C)  (Oral)   Wt 222 lb 8 oz (100.9 kg)   SpO2 96%   BMI 39.41 kg/m   Wt Readings from Last 3 Encounters:  07/31/17 222 lb 8 oz (100.9 kg)  05/27/17 220 lb (99.8 kg)  04/11/17 221 lb (100.2 kg)    Physical Exam  Constitutional: She appears well-developed and well-nourished. No distress.  HENT:  Mouth/Throat: Oropharynx is clear and moist. No oropharyngeal exudate.  Cardiovascular: Normal rate, regular rhythm, normal heart sounds and intact distal pulses.   No murmur heard. Pulmonary/Chest: Effort normal and breath sounds normal. No respiratory distress. She has no wheezes. She has no rales.  Musculoskeletal: She exhibits no edema.  Skin: Skin is warm and dry. There is erythema.  Mild erythema and tenderness L medial and posterior lower leg at calf with palpable superficial induration, no palpable cords  Nursing note and vitals reviewed.  Results for orders placed or performed in visit on 06/24/17  Potassium  Result Value Ref Range   Potassium 3.1 (L) 3.5 - 5.1 mEq/L      Assessment & Plan:   Problem List Items Addressed This Visit    Chronic venous insufficiency   Relevant Medications   spironolactone (ALDACTONE) 25 MG tablet   Hypokalemia    Chronic issue. Will trial spironolactone in place of metolazone. Continue lasix, Kdur 52mEq TID.  Discussed I would like to check K within 1 wk of starting spironolactone, however pt declines and prefers to get labs at upcoming onc appt in 2 wks.  Will stop magnesium due to GI upset.       Pedal edema    See below. Will continue lasix, trial spironolactone in place of metolazone.       Superficial phlebitis of left leg - Primary    Concern for this. Treat with leg elevation, warm compresses and voltaren gel. Update if not improving with treatment to consider venous ultrasound. Pt agrees with plan.       Relevant Medications   spironolactone (ALDACTONE) 25 MG tablet   Varicose veins of left lower extremity with complications    Relevant Medications   spironolactone (ALDACTONE) 25 MG tablet    Other Visit Diagnoses    Need for influenza vaccination       Relevant Orders   Flu vaccine HIGH DOSE PF (Fluzone High dose) (Completed)       Follow up plan: No Follow-up on file.  [name], MD

## 2017-07-31 NOTE — Patient Instructions (Addendum)
Flu shot today.  For left leg swelling - possible superficial phlebitis - treat with topical anti inflammatory cream (voltaren). Keep leg elevated. If not improving with this, let us know for ultrasound.  Let's try spironolactone 1/2 tablet (12.5mg ) daily in place of metolazone (zaroxolyn).  Continue lasix 40mg  daily.

## 2017-08-02 DIAGNOSIS — M79605 Pain in left leg: Secondary | ICD-10-CM | POA: Diagnosis not present

## 2017-08-02 DIAGNOSIS — M79604 Pain in right leg: Secondary | ICD-10-CM | POA: Diagnosis not present

## 2017-08-02 DIAGNOSIS — I83893 Varicose veins of bilateral lower extremities with other complications: Secondary | ICD-10-CM | POA: Diagnosis not present

## 2017-08-13 DIAGNOSIS — Z1231 Encounter for screening mammogram for malignant neoplasm of breast: Secondary | ICD-10-CM | POA: Diagnosis not present

## 2017-08-14 ENCOUNTER — Telehealth: Payer: Self-pay

## 2017-08-14 DIAGNOSIS — I8002 Phlebitis and thrombophlebitis of superficial vessels of left lower extremity: Secondary | ICD-10-CM

## 2017-08-14 DIAGNOSIS — M7989 Other specified soft tissue disorders: Secondary | ICD-10-CM

## 2017-08-14 NOTE — Telephone Encounter (Signed)
Pt left v/m pt seen 07/31/17; pt had to stop the gel prescribed because it was affecting pts asthma;leg still appears swollen and red is still there and leg still bothers pt. 07/31/17 office note has if not improving may consider venous US.Please advise.

## 2017-08-14 NOTE — Telephone Encounter (Signed)
Ok will remove voltaren gel from med list. Have ordered venous ultrasound on left leg for further evaluation.

## 2017-08-15 ENCOUNTER — Other Ambulatory Visit: Payer: Self-pay | Admitting: Family Medicine

## 2017-08-15 ENCOUNTER — Ambulatory Visit (HOSPITAL_BASED_OUTPATIENT_CLINIC_OR_DEPARTMENT_OTHER): Payer: Medicare Other | Admitting: Hematology & Oncology

## 2017-08-15 ENCOUNTER — Ambulatory Visit (HOSPITAL_BASED_OUTPATIENT_CLINIC_OR_DEPARTMENT_OTHER)
Admission: RE | Admit: 2017-08-15 | Discharge: 2017-08-15 | Disposition: A | Discharge status: Home / Self Care | Payer: Medicare Other | Source: Ambulatory Visit | Attending: Family Medicine | Admitting: Family Medicine

## 2017-08-15 ENCOUNTER — Other Ambulatory Visit (HOSPITAL_BASED_OUTPATIENT_CLINIC_OR_DEPARTMENT_OTHER): Payer: Medicare Other

## 2017-08-15 VITALS — BP 124/64 | HR 62 | Temp 97.5°F | Resp 20 | Wt 222.0 lb

## 2017-08-15 DIAGNOSIS — G5600 Carpal tunnel syndrome, unspecified upper limb: Secondary | ICD-10-CM

## 2017-08-15 DIAGNOSIS — I8002 Phlebitis and thrombophlebitis of superficial vessels of left lower extremity: Secondary | ICD-10-CM

## 2017-08-15 DIAGNOSIS — Z85820 Personal history of malignant melanoma of skin: Secondary | ICD-10-CM

## 2017-08-15 DIAGNOSIS — I8000 Phlebitis and thrombophlebitis of superficial vessels of unspecified lower extremity: Secondary | ICD-10-CM | POA: Diagnosis not present

## 2017-08-15 DIAGNOSIS — Z9889 Other specified postprocedural states: Secondary | ICD-10-CM | POA: Diagnosis not present

## 2017-08-15 DIAGNOSIS — C4361 Malignant melanoma of right upper limb, including shoulder: Secondary | ICD-10-CM

## 2017-08-15 DIAGNOSIS — C439 Malignant melanoma of skin, unspecified: Secondary | ICD-10-CM

## 2017-08-15 LAB — CBC WITH DIFFERENTIAL (CANCER CENTER ONLY)
BASO#: 0 10*3/uL (ref 0.0–0.2)
BASO%: 0.2 % (ref 0.0–2.0)
EOS%: 3.6 % (ref 0.0–7.0)
Eosinophils Absolute: 0.2 10*3/uL (ref 0.0–0.5)
HCT: 40.5 % (ref 34.8–46.6)
HGB: 13.2 g/dL (ref 11.6–15.9)
LYMPH#: 2 10*3/uL (ref 0.9–3.3)
LYMPH%: 34 % (ref 14.0–48.0)
MCH: 30.7 pg (ref 26.0–34.0)
MCHC: 32.6 g/dL (ref 32.0–36.0)
MCV: 94 fL (ref 81–101)
MONO#: 0.5 10*3/uL (ref 0.1–0.9)
MONO%: 7.9 % (ref 0.0–13.0)
NEUT#: 3.2 10*3/uL (ref 1.5–6.5)
NEUT%: 54.3 % (ref 39.6–80.0)
Platelets: 196 10*3/uL (ref 145–400)
RBC: 4.3 10*6/uL (ref 3.70–5.32)
RDW: 13.5 % (ref 11.1–15.7)
WBC: 5.8 10*3/uL (ref 3.9–10.0)

## 2017-08-15 LAB — LACTATE DEHYDROGENASE: LDH: 212 U/L (ref 125–245)

## 2017-08-15 LAB — CMP (CANCER CENTER ONLY)
ALT(SGPT): 43 U/L (ref 10–47)
AST: 23 U/L (ref 11–38)
Albumin: 4 g/dL (ref 3.3–5.5)
Alkaline Phosphatase: 48 U/L (ref 26–84)
BUN, Bld: 14 mg/dL (ref 7–22)
CO2: 29 mEq/L (ref 18–33)
Calcium: 9.7 mg/dL (ref 8.0–10.3)
Chloride: 107 mEq/L (ref 98–108)
Creat: 1 mg/dl (ref 0.6–1.2)
Glucose, Bld: 107 mg/dL (ref 73–118)
Potassium: 4.1 mEq/L (ref 3.3–4.7)
Sodium: 149 mEq/L — ABNORMAL HIGH (ref 128–145)
Total Bilirubin: 0.6 mg/dl (ref 0.20–1.60)
Total Protein: 7.9 g/dL (ref 6.4–8.1)

## 2017-08-15 NOTE — Telephone Encounter (Signed)
Fwd to Dr. Darnell Level
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
## 2017-08-15 NOTE — Telephone Encounter (Signed)
Patient is seeing Dr Marin Olp in [facility] today and I called the imaging dept and they can do her [place] there if the order is changed to an imaging order. They changed your order for you and they just need you to sign off on it.

## 2017-08-15 NOTE — Telephone Encounter (Signed)
Noted. Ok to do. thanks.

## 2017-08-16 ENCOUNTER — Other Ambulatory Visit: Payer: Self-pay | Admitting: Family Medicine

## 2017-08-16 MED ORDER — CEPHALEXIN 500 MG PO CAPS: 500.0000 mg | ORAL_CAPSULE | Freq: Three times a day (TID) | ORAL | 0 refills | Status: DC

## 2017-09-04 ENCOUNTER — Telehealth: Payer: Self-pay | Admitting: Family Medicine

## 2017-09-04 NOTE — Telephone Encounter (Signed)
Copied from [facility] #[id number]. Topic: Quick Communication - See Telephone Encounter >> Sep 04, 2017 12:33 PM Cleaster Corin, NT wrote: CRM for notification. See Telephone encounter for:  09/04/17. Pt called to let dr. Darnell Level know that swelling in left leg has not got better finished antibiotic blood work and ultra sound already done 2 weeks later there no change wants to know what to do next

## 2017-09-05 NOTE — Telephone Encounter (Signed)
Pt was notified of results and instructions, pt verbalized understanding.   

## 2017-09-05 NOTE — Telephone Encounter (Signed)
Would have her try a low dose anti inflammatory - ibuprofen 400mg  (2 tablets) twice daily with meals for the next week to see if that will help with pain and swelling. Would also encourage knee high socks/stockings as I think she cannot do compression stockings.

## 2017-09-17 IMAGING — CR DG CERVICAL SPINE 2 OR 3 VIEWS
2 series · 2 of 2 positions shown · non-contrast
Comparison: Cervical MRI, [DATE]

CLINICAL DATA: C 4-5, 5-6, 6-7 anterior cervical
decompression/diskectomy/fusion. Portable lateral operative imaging.

EXAM:
CERVICAL SPINE - 2-3 VIEW

[lat (1 of 2)]
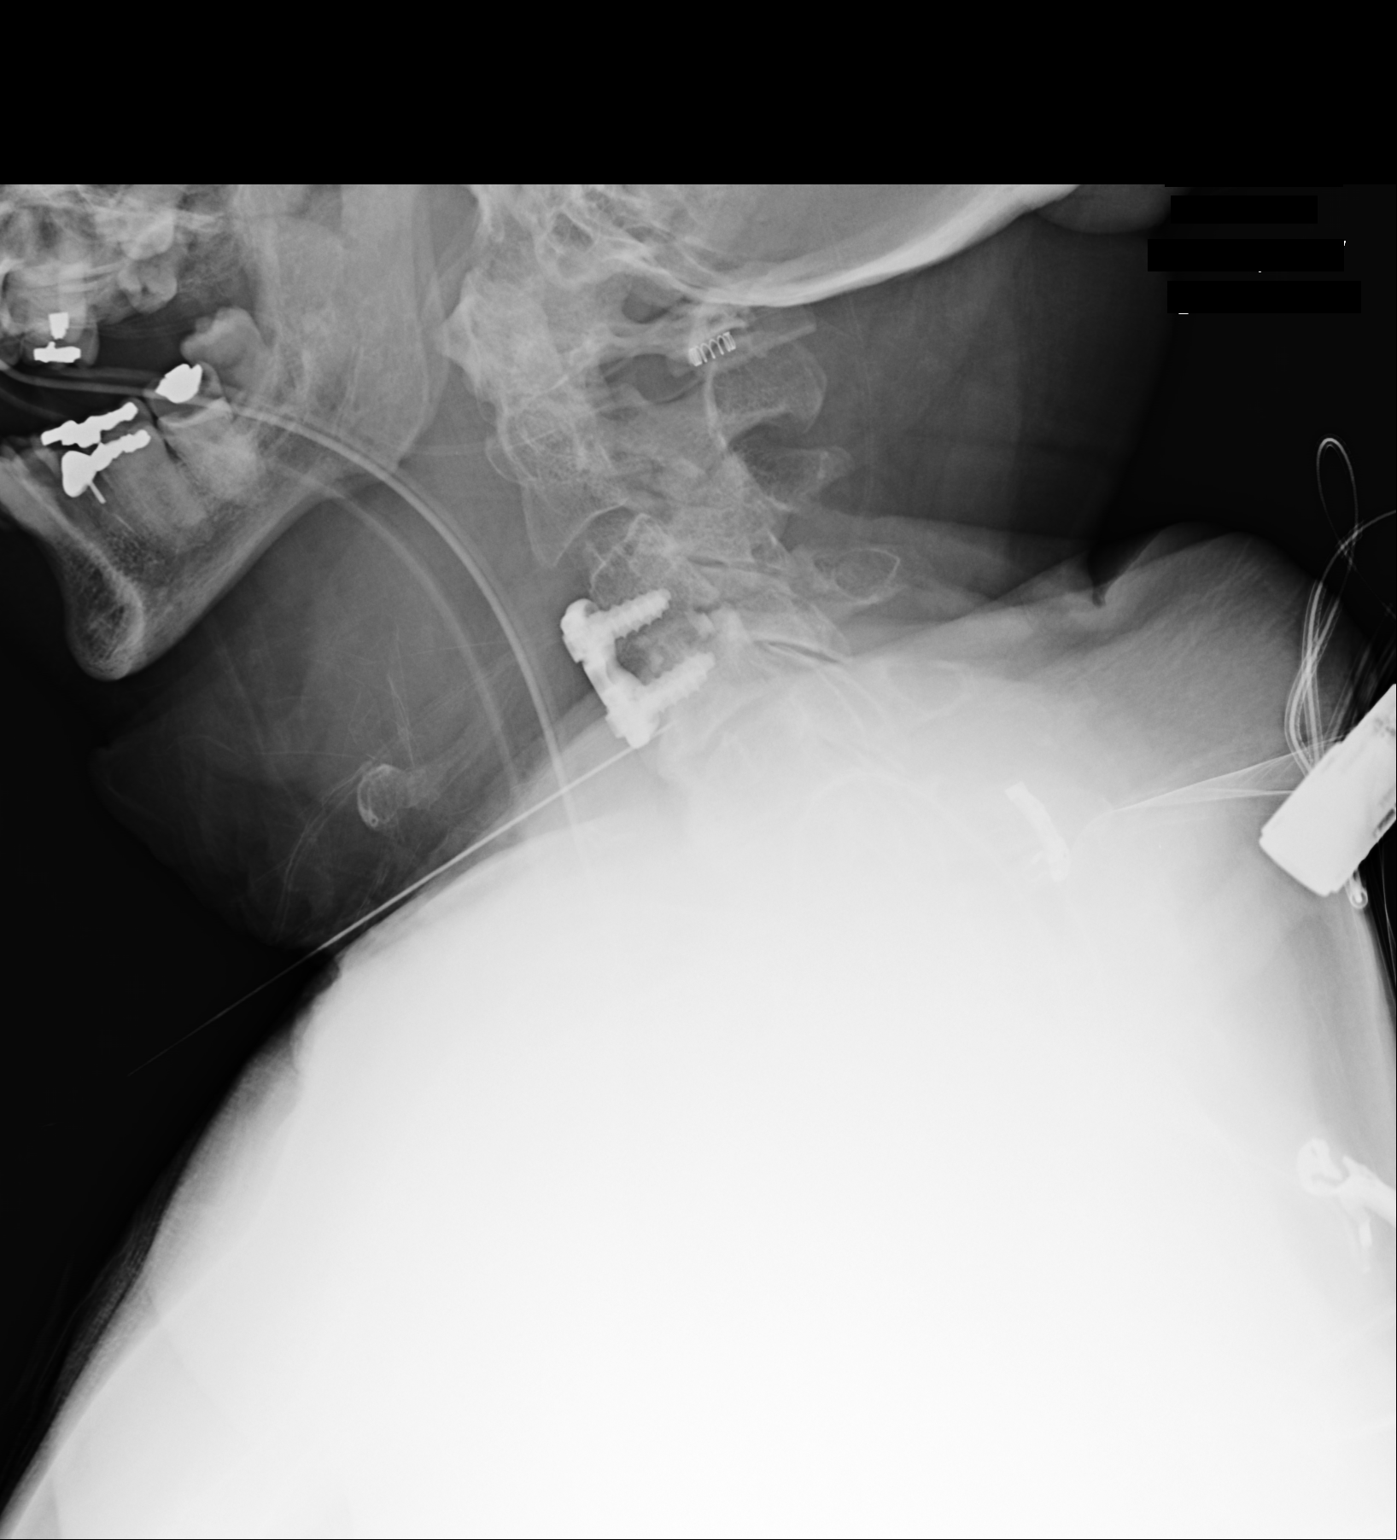

[lat (2 of 2)]
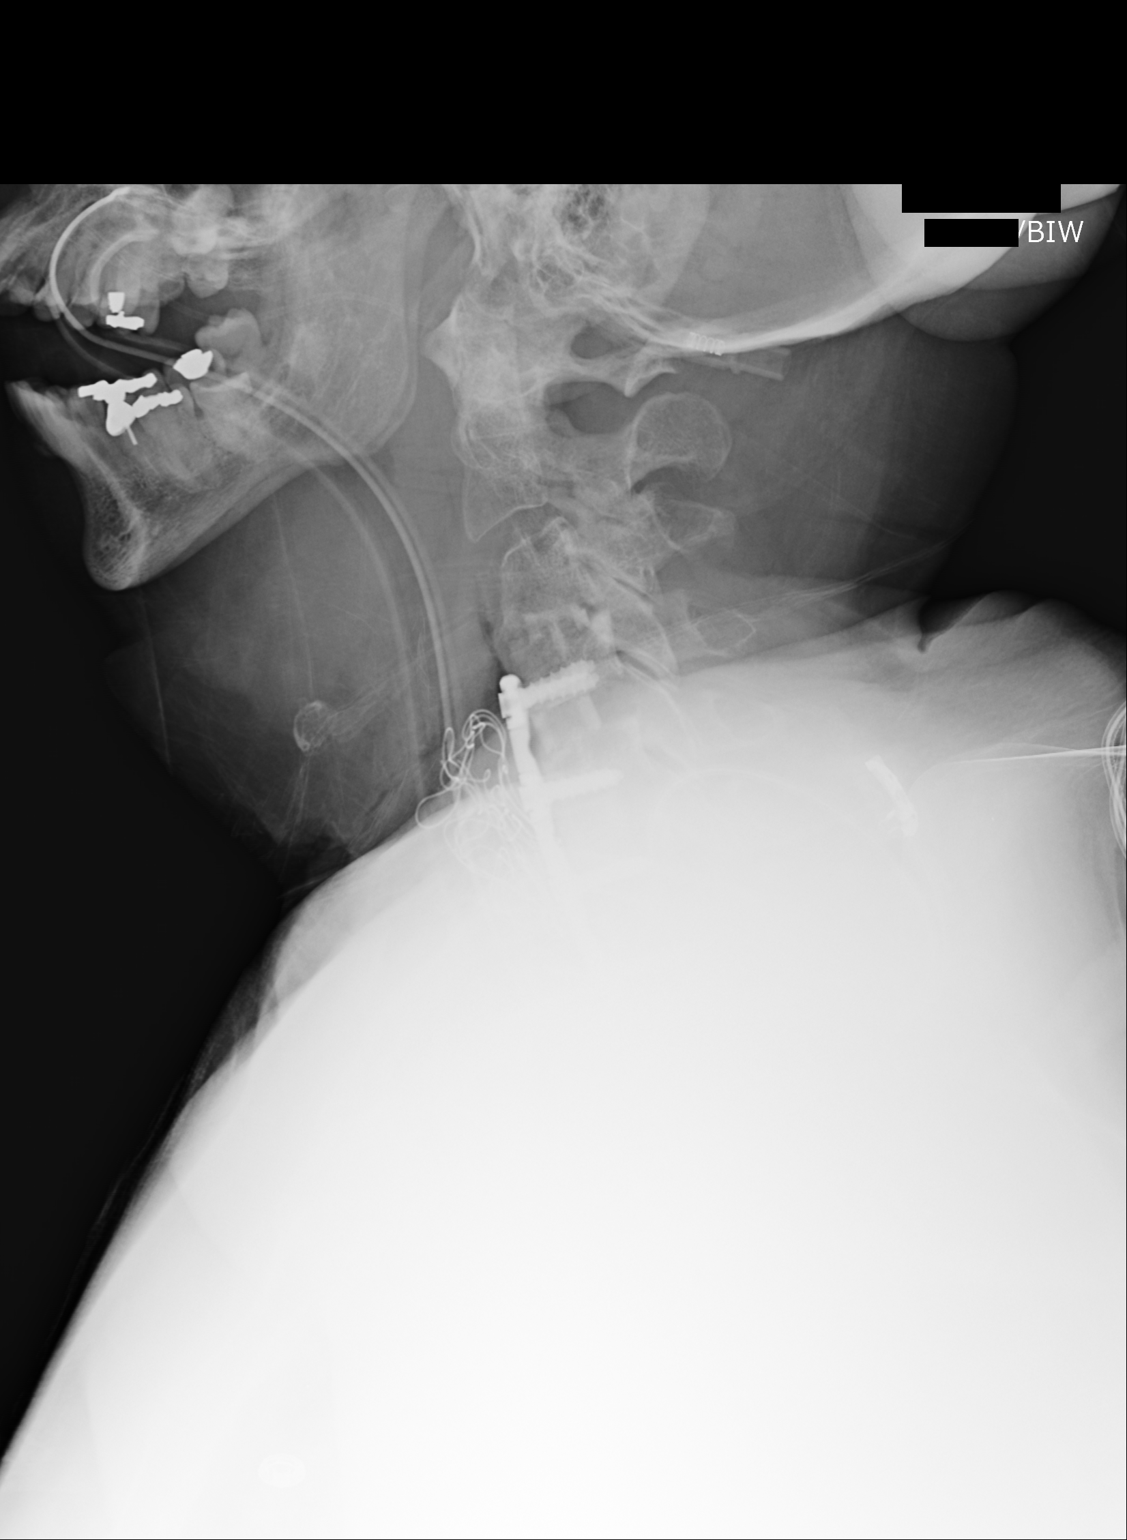

[2 of 2 positions shown; findings below may reference images not displayed]

FINDINGS: The surgical needle is been inserted from an anterior approach. The
tip projects along the inferior margin of the C3-C4 anterior fusion
plate, superimposed over the anterior mid to lower aspect of the C4
vertebra.
IMPRESSION: Portable lateral cervical spine imaging for surgical localization as
described.

## 2017-09-18 ENCOUNTER — Other Ambulatory Visit: Payer: Self-pay | Admitting: Family Medicine

## 2017-09-18 NOTE — Telephone Encounter (Signed)
Last filled:  08/20/17, #30 Last OV:  07/31/17 Next OV:  10/07/17

## 2017-09-23 ENCOUNTER — Ambulatory Visit (INDEPENDENT_AMBULATORY_CARE_PROVIDER_SITE_OTHER): Payer: Medicare Other | Admitting: Podiatry

## 2017-09-23 ENCOUNTER — Encounter: Payer: Self-pay | Admitting: Podiatry

## 2017-09-23 ENCOUNTER — Other Ambulatory Visit: Payer: Self-pay | Admitting: Family Medicine

## 2017-09-23 DIAGNOSIS — B351 Tinea unguium: Secondary | ICD-10-CM | POA: Diagnosis not present

## 2017-09-23 DIAGNOSIS — M79676 Pain in unspecified toe(s): Secondary | ICD-10-CM

## 2017-09-23 DIAGNOSIS — I872 Venous insufficiency (chronic) (peripheral): Secondary | ICD-10-CM

## 2017-09-23 NOTE — Progress Notes (Signed)
Complaint:  Visit Type: Patient returns to my office for continued preventative foot care services. Complaint: Patient states" my nails have grown long and thick and become painful to walk and wear shoes"  The patient presents for preventative foot care services. No changes to ROS.  Patient says the burning in her feet has stopped wearing the powerstep insoles. Her ankle pain and swelling persists.  Podiatric Exam: Vascular: dorsalis pedis and posterior tibial pulses are palpable bilateral. Capillary return is immediate. Temperature gradient is WNL. Skin turgor WNL Varicosities  Feet  B/L Sensorium: Normal Semmes Weinstein monofilament test. Normal tactile sensation bilaterally. Nail Exam: Pt has thick disfigured discolored nails with subungual debris noted bilateral entire nail hallux through fifth toenails Ulcer Exam: There is no evidence of ulcer or pre-ulcerative changes or infection. Orthopedic Exam: Muscle tone and strength are WNL. No limitations in general ROM. No crepitus or effusions noted. Foot type and digits show no abnormalities. Bony prominences are unremarkable. Swelling ankles  B/L Skin: No Porokeratosis. No infection or ulcers.  Asymptomatic clavi.  Diagnosis:  Onychomycosis, , Pain in right toe, pain in left toes  Treatment & Plan Procedures and Treatment: Consent by patient was obtained for treatment procedures. The patient understood the discussion of treatment and procedures well. All questions were answered thoroughly reviewed. Debridement of mycotic and hypertrophic toenails, 1 through 5 bilateral and clearing of subungual debris. No ulceration, no infection noted. Patient requests minimal  dremel use. Return Visit-Office Procedure: Patient instructed to return to the office for a follow up visit 3 months for continued evaluation and treatment.    Gardiner Barefoot DPM

## 2017-09-25 ENCOUNTER — Ambulatory Visit (INDEPENDENT_AMBULATORY_CARE_PROVIDER_SITE_OTHER): Payer: Medicare Other

## 2017-09-25 ENCOUNTER — Other Ambulatory Visit: Payer: Self-pay | Admitting: Family Medicine

## 2017-09-25 VITALS — BP 118/70 | HR 57 | Temp 97.7°F | Ht 62.25 in | Wt 220.5 lb

## 2017-09-25 DIAGNOSIS — I159 Secondary hypertension, unspecified: Secondary | ICD-10-CM

## 2017-09-25 DIAGNOSIS — E559 Vitamin D deficiency, unspecified: Secondary | ICD-10-CM

## 2017-09-25 DIAGNOSIS — Z0000 Encounter for general adult medical examination without abnormal findings: Secondary | ICD-10-CM | POA: Diagnosis not present

## 2017-09-25 DIAGNOSIS — D509 Iron deficiency anemia, unspecified: Secondary | ICD-10-CM

## 2017-09-25 LAB — BASIC METABOLIC PANEL
BUN: 17 mg/dL (ref 6–23)
CO2: 29 mEq/L (ref 19–32)
Calcium: 9.6 mg/dL (ref 8.4–10.5)
Chloride: 104 mEq/L (ref 96–112)
Creatinine, Ser: 0.67 mg/dL (ref 0.40–1.20)
GFR: 92.16 mL/min (ref >60.00)
Glucose, Bld: 104 mg/dL — ABNORMAL HIGH (ref 70–99)
Potassium: 5.1 mEq/L (ref 3.5–5.1)
Sodium: 140 mEq/L (ref 135–145)

## 2017-09-25 LAB — LIPID PANEL
Cholesterol: 141 mg/dL (ref 0–200)
HDL: 48.3 mg/dL (ref >39.00)
LDL Cholesterol: 76 mg/dL (ref 0–99)
NonHDL: 92.57
Total CHOL/HDL Ratio: 3
Triglycerides: 84 mg/dL (ref 0.0–149.0)
VLDL: 16.8 mg/dL (ref 0.0–40.0)

## 2017-09-25 LAB — VITAMIN D 25 HYDROXY (VIT D DEFICIENCY, FRACTURES): VITD: 48.58 ng/mL (ref 30.00–100.00)

## 2017-09-25 NOTE — Patient Instructions (Addendum)
[name] , Thank you for taking time to come for your Medicare Wellness Visit. I appreciate your ongoing commitment to your health goals. Please review the following plan we discussed and let me know if I can assist you in the future.   These are the goals we discussed: Goals    . DIET - INCREASE WATER INTAKE     Starting 09/25/2017, I will continue to drink at least 5-8 glasses of water daily.        This is a list of the screening recommended for you and due dates:  Health Maintenance  Topic Date Due  . DTaP/Tdap/Td vaccine (1 - Tdap) 11/05/2018*  . Colon Cancer Screening  09/24/2021*  . Tetanus Vaccine  11/05/2018  . Mammogram  08/13/2019  . Flu Shot  Completed  . DEXA scan (bone density measurement)  Completed  .  Hepatitis C: One time screening is recommended by Center for Disease Control  (CDC) for  adults born from [birth year] through 1965.   Completed  . Pneumonia vaccines  Completed  *Topic was postponed. The date shown is not the original due date.      Preventive Care for Adults  A healthy lifestyle and preventive care can promote health and wellness. Preventive health guidelines for adults include the following key practices.  . A routine yearly physical is a good way to check with your health care provider about your health and preventive screening. It is a chance to share any concerns and updates on your health and to receive a thorough exam.  . Visit your dentist for a routine exam and preventive care every 6 months. Brush your teeth twice a day and floss once a day. Good oral hygiene prevents tooth decay and gum disease.  . The frequency of eye exams is based on your age, health, family medical history, use  of contact lenses, and other factors. Follow your health care provider's recommendations for frequency of eye exams.  . Eat a healthy diet. Foods like vegetables, fruits, whole grains, low-fat dairy products, and lean protein foods contain the nutrients you need  without too many calories. Decrease your intake of foods high in solid fats, added sugars, and salt. Eat the right amount of calories for you. Get information about a proper diet from your health care provider, if necessary.  . Regular physical exercise is one of the most important things you can do for your health. Most adults should get at least 150 minutes of moderate-intensity exercise (any activity that increases your heart rate and causes you to sweat) each week. In addition, most adults need muscle-strengthening exercises on 2 or more days a week.  Silver Sneakers may be a benefit available to you. To determine eligibility, you may visit the website: www.silversneakers.com or contact program at [phone] Mon-Fri between 8AM-8PM.   . Maintain a healthy weight. The body mass index (BMI) is a screening tool to identify possible weight problems. It provides an estimate of body fat based on height and weight. Your health care provider can find your BMI and can help you achieve or maintain a healthy weight.   For adults 20 years and older: ? A BMI below 18.5 is considered underweight. ? A BMI of 18.5 to 24.9 is normal. ? A BMI of 25 to 29.9 is considered overweight. ? A BMI of 30 and above is considered obese.   . Maintain normal blood lipids and cholesterol levels by exercising and minimizing your intake of saturated fat. Eat  a balanced diet with plenty of fruit and vegetables. Blood tests for lipids and cholesterol should begin at age 28 and be repeated every 5 years. If your lipid or cholesterol levels are high, you are over 50, or you are at high risk for heart disease, you may need your cholesterol levels checked more frequently. Ongoing high lipid and cholesterol levels should be treated with medicines if diet and exercise are not working.  . If you smoke, find out from your health care provider how to quit. If you do not use tobacco, please do not start.  . If you choose to drink  alcohol, please do not consume more than 2 drinks per day. One drink is considered to be 12 ounces (355 mL) of beer, 5 ounces (148 mL) of wine, or 1.5 ounces (44 mL) of liquor.  . If you are 63-2 years old, ask your health care provider if you should take aspirin to prevent strokes.  . Use sunscreen. Apply sunscreen liberally and repeatedly throughout the day. You should seek shade when your shadow is shorter than you. Protect yourself by wearing long sleeves, pants, a wide-brimmed hat, and sunglasses year round, whenever you are outdoors.  . Once a month, do a whole body skin exam, using a mirror to look at the skin on your back. Tell your health care provider of new moles, moles that have irregular borders, moles that are larger than a pencil eraser, or moles that have changed in shape or color.

## 2017-09-25 NOTE — Progress Notes (Signed)
Pre visit review using our clinic review tool, if applicable. No additional management support is needed unless otherwise documented below in the visit note. 

## 2017-09-25 NOTE — Progress Notes (Signed)
PCP notes:   Health maintenance:  No gaps noted.   Abnormal screenings:   Hearing - failed  Hearing Screening   125Hz  250Hz  500Hz  1000Hz  2000Hz  3000Hz  4000Hz  6000Hz  8000Hz   Right ear:   40 40 40  40    Left ear:   40 40 40  0      Patient concerns:   None  Nurse concerns:  None  Next PCP appt:   10/07/17 @ 1130

## 2017-09-25 NOTE — Progress Notes (Signed)
Subjective:   [name] is a 71 y.o. female who presents for Medicare Annual (Subsequent) preventive examination.  Review of Systems:  N/A Cardiac Risk Factors include: advanced age (>29men, >41 women);obesity (BMI >30kg/m2);hypertension     Objective:     Vitals: BP 118/70 (BP Location: Left Arm, Patient Position: Sitting, Cuff Size: Large)   Pulse (!) 57   Temp 97.7 F (36.5 C) (Oral)   Ht 5' 2.25" (1.581 m) Comment: no shoes  Wt 220 lb 8 oz (100 kg)   SpO2 92%   BMI 40.01 kg/m   Body mass index is 40.01 kg/m.   Tobacco Social History   Tobacco Use  Smoking Status Former Smoker  . Types: Cigarettes  . Last attempt to quit: 11/05/1965  . Years since quitting: 51.9  Smokeless Tobacco Never Used  Tobacco Comment   socially as a teen     Counseling given: No Comment: socially as a teen   Past Medical History:  Diagnosis Date  . Anesthesia complication    trouble waking up 2/2 CPAP  . Arthritis   . Asthma in adult   . Cervical spondylosis 2012   [facility] Grip, [place])  . Choroidal nevus of left eye 03/22/2016  . Chronic venous insufficiency 2016   severe reflux with painful varicosities sees [facility]  . Complication of anesthesia    difficulty coming out due to sleep apnea per pt  . Difficult intubation    PATIENT DENIES   . DVT (deep venous thrombosis) ([place]) 2011   small, developed after venous ablation  . Family history of adverse reaction to anesthesia    O2 levels drop upon waking   . Hearing loss sensory, bilateral 2013   mod-severe high freq sensorineural ([facility])  . History of kidney stones 1980s  . History of radiation therapy 07/19/11-08/25/2011   RIGHT AXILLARY REGION/METASTATIC  . Hypertension   . Kidney stones   . Melanoma ([place]) 06/29/10   MALIGNANT MELANOMA R SHOULDER/SUPRASCAPULAR BACK s/p interferon chemo and XRT  . Neuromuscular disorder ([facility])    muscle spasms  . OSA (obstructive sleep apnea)    on CPAP  . Pneumonia    2001  . RLS  (restless legs syndrome)   . Seasonal allergies   . Tinnitus   . Unspecified vitamin D deficiency 03/18/2013  . Varicose veins of left lower extremity    Past Surgical History:  Procedure Laterality Date  . ABI  2016   WNL  . ANTERIOR CERVICAL DECOMP/DISCECTOMY FUSION N/A 08/22/2015   ANTERIOR CERVICAL DECOMPRESSION FUSION C3/4 - interbody graft and anterior plate; Kristeen Miss, MD  . ANTERIOR CERVICAL DECOMP/DISCECTOMY FUSION N/A 07/02/2016   Procedure: Cervical four- five Cervical five- six Cervical six- seven Anterior cervical decompression/diskectomy/fusion;  Surgeon: Kristeen Miss, MD;  Location: [facility];  Service: Neurosurgery;  Laterality: N/A;  C4-5 C5-6 C6-7 Anterior cervical decompression/diskectomy/fusion  . BREAST BIOPSY Left 2013   BENIGN  . BREAST SURGERY Right 1990   BX   . CARDIOVASCULAR STRESS TEST  2010   normal stress test, EF 66%  . COLONOSCOPY  03/2005   benign polyp, int hem, rpt 5 yrs ([place], [place])  . DEEP AXILLARY SENTINEL NODE BIOPSY / EXCISION  2012   RIGHT  . DEXA  06/2016   WNL  . DILATION AND CURETTAGE OF UTERUS  2010  . ENDOVENOUS ABLATION SAPHENOUS VEIN W/ LASER Left 2010   [facility]  . ENDOVENOUS ABLATION SAPHENOUS VEIN W/ LASER Left 2016  accessory branch [facility] Kellie Simmering)  . ESOPHAGOGASTRODUODENOSCOPY  2006   mod chronic carditis, no H pylori ([place], [place])  . [place]   left  . LUMBAR LAMINECTOMY  2001   L4/5  . MELANOMA EXCISION  2011   Right shoulder, with sentinel lymph node biopsy  . PORT-A-CATH REMOVAL  12/05/2011   Procedure: REMOVAL PORT-A-CATH;  Surgeon: Rolm Bookbinder, MD;  Location: [facility];  Service: General;  Laterality: N/A;  left port removal  . PORTACATH PLACEMENT     left subclavian   Family History  Problem Relation Age of Onset  . Cancer Mother 67       uterine  . CAD Father 78       MI  . Hypertension Father 15  . Alzheimer's disease Father 56  . Cancer Cousin        x2, breast   . Diabetes Sister   . Cancer Paternal Grandmother        melanoma, possibly   Social History   Substance and Sexual Activity  Sexual Activity No    Outpatient Encounter Medications as of 09/25/2017  Medication Sig  . acetaminophen (TYLENOL ARTHRITIS PAIN) 650 MG CR tablet Take 1,300 mg by mouth every 8 (eight) hours as needed for pain.   Marland Kitchen albuterol (PROAIR HFA) 108 (90 Base) MCG/ACT inhaler INHALE 2 PUFFS INTO LUNGS EVERY 6 HOURS AS NEEDED FOR WHEEZING OR SHORTNESS OF BREATH  . albuterol (PROVENTIL) (2.5 MG/3ML) 0.083% nebulizer solution Take 3 mLs (2.5 mg total) by nebulization every 6 (six) hours as needed for wheezing or shortness of breath.  Marland Kitchen aspirin EC 81 MG tablet Take 81 mg by mouth at bedtime. Melanoma prevention  . cetirizine (ZYRTEC) 10 MG tablet Take 10 mg by mouth daily.  . furosemide (LASIX) 40 MG tablet Take 1 tablet (40 mg total) by mouth daily.  Marland Kitchen gabapentin (NEURONTIN) 100 MG capsule TAKE 1 CAPSULE (100 MG TOTAL) BY MOUTH AT BEDTIME.  Marland Kitchen losartan (COZAAR) 50 MG tablet Take 1 tablet (50 mg total) by mouth daily.  . montelukast (SINGULAIR) 10 MG tablet TAKE 1 TABLET BY MOUTH DAILY  . Multiple Vitamin (MULTIVITAMIN WITH MINERALS) TABS tablet Take 1 tablet by mouth daily.  . Polyvinyl Alcohol-Povidone (TEARS PLUS OP) Place 1 drop into both eyes daily as needed (dry eyes/ redness/ burning).  . potassium chloride SA (K-DUR,KLOR-CON) 20 MEQ tablet Take 1 tablet (20 mEq total) by mouth 3 (three) times daily.  Marland Kitchen PRESCRIPTION MEDICATION CPAP  . Probiotic Product (PROBIOTIC DAILY PO) Take 1 tablet by mouth daily.   Marland Kitchen spironolactone (ALDACTONE) 25 MG tablet Take 0.5 tablets (12.5 mg total) by mouth daily.  . Vitamin D, Ergocalciferol, (DRISDOL) 50000 units CAPS capsule TAKE 1 CAPSULE (50,000 UNITS TOTAL) BY MOUTH EVERY 7 (SEVEN) DAYS.  . [DISCONTINUED] cephALEXin (KEFLEX) 500 MG capsule Take 1 capsule (500 mg total) by mouth 3 (three) times daily.   No facility-administered  encounter medications on file as of 09/25/2017.     Activities of Daily Living In your present state of health, do you have any difficulty performing the following activities: 09/25/2017  Hearing? N  Vision? Y  Comment cataract in left eye  Difficulty concentrating or making decisions? N  Walking or climbing stairs? Y  Comment intermittent wheezing from climbing stairs  Dressing or bathing? N  Doing errands, shopping? N  Preparing Food and eating ? N  Using the Toilet? N  In the past six months, have you  accidently leaked urine? N  Do you have problems with loss of bowel control? N  Managing your Medications? N  Managing your Finances? N  Housekeeping or managing your Housekeeping? N  Some recent data might be hidden    Patient Care Team: [name], MD as PCP - General (Family Medicine) Marin Olp Rudell Cobb, MD as Consulting Physician (Oncology) Kristeen Miss, MD as Consulting Physician (Neurosurgery) Danella Sensing, MD as Consulting Physician (Dermatology) Rigoberto Noel, MD as Consulting Physician (Pulmonary Disease) Garrel Ridgel, DPM as Consulting Physician (Podiatry) Eulogio Bear, MD as Consulting Physician (Ophthalmology)    Assessment:     Hearing Screening   125Hz  250Hz  500Hz  1000Hz  2000Hz  3000Hz  4000Hz  6000Hz  8000Hz   Right ear:   40 40 40  40    Left ear:   40 40 40  0    Vision Screening Comments: Last vision exam in 2018 @ [place] and Dietary recommendations Current Exercise Habits: The patient does not participate in regular exercise at present, Exercise limited by: orthopedic condition(s)  Goals    . DIET - INCREASE WATER INTAKE     Starting 09/25/2017, I will continue to drink at least 5-8 glasses of water daily.       Fall Risk Fall Risk  09/25/2017 01/09/2017 09/24/2016 03/19/2016 09/23/2015  Falls in the past year? No No No No No   Depression Screen PHQ 2/9 Scores 09/25/2017 09/24/2016 09/23/2015 09/22/2014    PHQ - 2 Score 0 0 0 0  PHQ- 9 Score 0 - - -     Cognitive Function MMSE - Mini Mental State Exam 09/25/2017 09/24/2016  Orientation to time 5 5  Orientation to Place 5 5  Registration 3 3  Attention/ Calculation 0 0  Recall 3 3  Language- name 2 objects 0 0  Language- repeat 1 1  Language- follow 3 step command 3 3  Language- read & follow direction 0 0  Write a sentence 0 0  Copy design 0 0  Total score 20 20     PLEASE NOTE: A Mini-Cog screen was completed. Maximum score is 20. A value of 0 denotes this part of [place]stein MMSE was not completed or the patient failed this part of the Mini-Cog screening.   Mini-Cog Screening Orientation to Time - Max 5 pts Orientation to Place - Max 5 pts Registration - Max 3 pts Recall - Max 3 pts Language Repeat - Max 1 pts Language Follow 3 Step Command - Max 3 pts     Immunization History  Administered Date(s) Administered  . Influenza, High Dose Seasonal PF 08/13/2016, 07/31/2017  . Influenza, Seasonal, Injecte, Preservative Fre 09/01/2009  . Influenza,inj,Quad PF,6+ Mos 08/06/2013, 09/17/2014, 08/18/2015  . Pneumococcal Conjugate-13 09/22/2014  . Pneumococcal Polysaccharide-23 05/15/2013   Screening Tests Health Maintenance  Topic Date Due  . DTaP/Tdap/Td (1 - Tdap) 11/05/2018 (Originally 07/09/1965)  . COLONOSCOPY  09/24/2021 (Originally 03/19/2010)  . TETANUS/TDAP  11/05/2018  . MAMMOGRAM  08/13/2019  . INFLUENZA VACCINE  Completed  . DEXA SCAN  Completed  . Hepatitis C Screening  Completed  . PNA vac Low Risk Adult  Completed      Plan:     I have personally reviewed, addressed, and noted the following in the patient's chart:  A. Medical and social history B. Use of alcohol, tobacco or illicit drugs  C. Current medications and supplements D. Functional ability and status E.  Nutritional status F.  Physical activity G.  Advance directives H. List of other physicians I.  Hospitalizations, surgeries, and ER visits in  previous 12 months J.  [place] to include hearing, vision, cognitive, depression L. Referrals and appointments - none  In addition, I have reviewed and discussed with patient certain preventive protocols, quality metrics, and best practice recommendations. A written personalized care plan for preventive services as well as general preventive health recommendations were provided to patient.  See attached scanned questionnaire for additional information.   Signed,   Lindell Noe, MHA, BS, LPN Health Coach

## 2017-09-29 NOTE — Progress Notes (Signed)
I reviewed health advisor's note, was available for consultation, and agree with documentation and plan.  

## 2017-10-06 ENCOUNTER — Other Ambulatory Visit: Payer: Self-pay | Admitting: Family Medicine

## 2017-10-07 ENCOUNTER — Ambulatory Visit (INDEPENDENT_AMBULATORY_CARE_PROVIDER_SITE_OTHER): Payer: Medicare Other | Admitting: Family Medicine

## 2017-10-07 ENCOUNTER — Encounter: Payer: Self-pay | Admitting: Family Medicine

## 2017-10-07 VITALS — BP 120/78 | HR 65 | Temp 97.9°F | Ht 62.0 in | Wt 223.0 lb

## 2017-10-07 DIAGNOSIS — Z1211 Encounter for screening for malignant neoplasm of colon: Secondary | ICD-10-CM | POA: Diagnosis not present

## 2017-10-07 DIAGNOSIS — R600 Localized edema: Secondary | ICD-10-CM | POA: Diagnosis not present

## 2017-10-07 DIAGNOSIS — M4712 Other spondylosis with myelopathy, cervical region: Secondary | ICD-10-CM | POA: Diagnosis not present

## 2017-10-07 DIAGNOSIS — C439 Malignant melanoma of skin, unspecified: Secondary | ICD-10-CM

## 2017-10-07 DIAGNOSIS — M5412 Radiculopathy, cervical region: Secondary | ICD-10-CM

## 2017-10-07 DIAGNOSIS — I83893 Varicose veins of bilateral lower extremities with other complications: Secondary | ICD-10-CM | POA: Diagnosis not present

## 2017-10-07 DIAGNOSIS — E559 Vitamin D deficiency, unspecified: Secondary | ICD-10-CM | POA: Diagnosis not present

## 2017-10-07 DIAGNOSIS — I872 Venous insufficiency (chronic) (peripheral): Secondary | ICD-10-CM

## 2017-10-07 DIAGNOSIS — E876 Hypokalemia: Secondary | ICD-10-CM

## 2017-10-07 DIAGNOSIS — I159 Secondary hypertension, unspecified: Secondary | ICD-10-CM

## 2017-10-07 DIAGNOSIS — G959 Disease of spinal cord, unspecified: Secondary | ICD-10-CM

## 2017-10-07 MED ORDER — VITAMIN D (ERGOCALCIFEROL) 1.25 MG (50000 UNIT) PO CAPS: ORAL_CAPSULE | ORAL | 3 refills | Status: DC

## 2017-10-07 MED ORDER — POTASSIUM CHLORIDE CRYS ER 20 MEQ PO TBCR: 20.0000 meq | EXTENDED_RELEASE_TABLET | Freq: Two times a day (BID) | ORAL | 6 refills | Status: DC

## 2017-10-07 NOTE — Assessment & Plan Note (Signed)
Chronic, stable. Continue current regimen. 

## 2017-10-07 NOTE — Telephone Encounter (Signed)
Has annual Pt 2 tomorrow. Lab results will be discussed.

## 2017-10-07 NOTE — Assessment & Plan Note (Signed)
H/o ulcers and superficial phlebitis. Stable period. Continue current regimen.

## 2017-10-07 NOTE — Patient Instructions (Addendum)
Sign release for records of mammogram report from [facility]. We will refer you for colonoscopy.  If interested, check with pharmacy about new 2 shot shingles series (shingrix).  Decrease potassium to twice daily.  Return in 1 month for labs only (potassium) and in 3 months for follow up visit.  Good to see you today, call us with questions.

## 2017-10-07 NOTE — Assessment & Plan Note (Signed)
Finally improving, now at high range on spironolactone - will decrease Kdur supplementation to BID and recheck K levels in 1 month. Pt agrees with plan.

## 2017-10-07 NOTE — Assessment & Plan Note (Signed)
Continue 50,000 IU daily per pt preference. Has not tried OTC supplementation.

## 2017-10-07 NOTE — Assessment & Plan Note (Signed)
Close monitoring by dermatology.

## 2017-10-07 NOTE — Progress Notes (Signed)
BP 120/78 (BP Location: Left Arm, Patient Position: Sitting, Cuff Size: Large)   Pulse 65   Temp 97.9 F (36.6 C) (Oral)   Ht 5\' 2"  (1.575 m)   Wt 223 lb (101.2 kg)   SpO2 97%   BMI 40.79 kg/m    CC: CPE Subjective:    Patient ID: [name], female    DOB: 04-Mar-1946, 71 y.o.   MRN: [id number]  HPI: [name] is a 71 y.o. female presenting on 10/07/2017 for Annual Exam (Pt 2)   Saw Katha Cabal last week for medicare wellness visit. Note reviewed.   Ongoing trouble with L>R CVI with pedal edema. Last visit we recommended low dose NSAID x1 wk, knee high socks (cannot do compression stockings as lives alone). voltaren gel causes wheezing.   Preventative: Colonoscopy 2006. Due but wanted to await improvement of leg edema prior to rescheduling colonoscopy. Agrees to referral today.  Well woman exam with OBGYN Dr Kennon Rounds.  Mammogram WNL per patient summer this year [facility]). We never received - pt will sign ROI today.  DEXA Date: 06/2016 WNL.  Flu shot - yearly Tetanus ~2010 Pneumovax 05/2013 , prevnar 09/2014 zostavax - too expensive. shingrix - discussed. H/o shingles.  Advanced directives: doesn't want prolonged life support. Unsure who would be HCPOA - likely one of her sisters. Packet previously provided.  Seat belt use discussed.  Sunscreen use discussed. No changing moles on skin. Sees derm and onc yearly (h/o melanoma).  Ex smoker.  Alcohol - rare   Lives alone. Sister and husband live next door  Lives in [place].  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  Edu: some college Activity: no regular exercise Diet: poor - good water, rare fruits/vegetables  Relevant past medical, surgical, family and social history reviewed and updated as indicated. Interim medical history since our last visit reviewed. Allergies and medications reviewed and updated. Outpatient Medications Prior to Visit  Medication Sig Dispense Refill  . acetaminophen (TYLENOL  ARTHRITIS PAIN) 650 MG CR tablet Take 1,300 mg by mouth every 8 (eight) hours as needed for pain.     Marland Kitchen albuterol (PROAIR HFA) 108 (90 Base) MCG/ACT inhaler INHALE 2 PUFFS INTO LUNGS EVERY 6 HOURS AS NEEDED FOR WHEEZING OR SHORTNESS OF BREATH 8.5 Inhaler 1  . albuterol (PROVENTIL) (2.5 MG/3ML) 0.083% nebulizer solution Take 3 mLs (2.5 mg total) by nebulization every 6 (six) hours as needed for wheezing or shortness of breath. 150 mL 1  . aspirin EC 81 MG tablet Take 81 mg by mouth at bedtime. Melanoma prevention    . cetirizine (ZYRTEC) 10 MG tablet Take 10 mg by mouth daily.    . furosemide (LASIX) 40 MG tablet Take 1 tablet (40 mg total) by mouth daily. 30 tablet 6  . gabapentin (NEURONTIN) 100 MG capsule TAKE 1 CAPSULE (100 MG TOTAL) BY MOUTH AT BEDTIME. 30 capsule 3  . losartan (COZAAR) 50 MG tablet Take 1 tablet (50 mg total) by mouth daily. 30 tablet 5  . montelukast (SINGULAIR) 10 MG tablet TAKE 1 TABLET BY MOUTH DAILY 30 tablet 6  . Multiple Vitamin (MULTIVITAMIN WITH MINERALS) TABS tablet Take 1 tablet by mouth daily.    . Polyvinyl Alcohol-Povidone (TEARS PLUS OP) Place 1 drop into both eyes daily as needed (dry eyes/ redness/ burning).    Marland Kitchen PRESCRIPTION MEDICATION CPAP    . Probiotic Product (PROBIOTIC DAILY PO) Take 1 tablet by mouth daily.     Marland Kitchen spironolactone (ALDACTONE)  25 MG tablet Take 0.5 tablets (12.5 mg total) by mouth daily. 15 tablet 3  . potassium chloride SA (K-DUR,KLOR-CON) 20 MEQ tablet Take 1 tablet (20 mEq total) by mouth 3 (three) times daily. 90 tablet 6  . Vitamin D, Ergocalciferol, (DRISDOL) 50000 units CAPS capsule TAKE 1 CAPSULE (50,000 UNITS TOTAL) BY MOUTH EVERY 7 (SEVEN) DAYS. 4 capsule 3   No facility-administered medications prior to visit.      Per HPI unless specifically indicated in ROS section below Review of Systems     Objective:    BP 120/78 (BP Location: Left Arm, Patient Position: Sitting, Cuff Size: Large)   Pulse 65   Temp 97.9 F (36.6  C) (Oral)   Ht 5\' 2"  (1.575 m)   Wt 223 lb (101.2 kg)   SpO2 97%   BMI 40.79 kg/m   Wt Readings from Last 3 Encounters:  10/07/17 223 lb (101.2 kg)  09/25/17 220 lb 8 oz (100 kg)  08/15/17 222 lb (100.7 kg)    Physical Exam  Constitutional: She is oriented to person, place, and time. She appears well-developed and well-nourished. No distress.  HENT:  Head: Normocephalic and atraumatic.  Right Ear: Hearing and external ear normal.  Left Ear: Hearing and external ear normal.  Nose: Nose normal.  Mouth/Throat: Uvula is midline, oropharynx is clear and moist and mucous membranes are normal. No oropharyngeal exudate, posterior oropharyngeal edema or posterior oropharyngeal erythema.  Eyes: Conjunctivae and EOM are normal. Pupils are equal, round, and reactive to light. No scleral icterus.  Neck: Normal range of motion. Neck supple. Carotid bruit is not present. No thyromegaly present.  Cardiovascular: Normal rate, regular rhythm, normal heart sounds and intact distal pulses.  No murmur heard. Pulses:      Radial pulses are 2+ on the right side, and 2+ on the left side.  Pulmonary/Chest: Effort normal and breath sounds normal. No respiratory distress. She has no wheezes. She has no rales.  Abdominal: Soft. Bowel sounds are normal. She exhibits no distension and no mass. There is no tenderness. There is no rebound and no guarding.  Musculoskeletal: Normal range of motion. She exhibits edema (nonpitting).  No ulcers today Mild compression knee support socks in place  Lymphadenopathy:    She has no cervical adenopathy.  Neurological: She is alert and oriented to person, place, and time.  CN grossly intact, station and gait intact  Skin: Skin is warm and dry. No rash noted.  Psychiatric: She has a normal mood and affect. Her behavior is normal. Judgment and thought content normal.  Nursing note and vitals reviewed.  Results for orders placed or performed in visit on 09/25/17  VITAMIN D  25 Hydroxy (Vit-D Deficiency, Fractures)  Result Value Ref Range   VITD 48.58 30.00 - 100.00 ng/mL  Basic metabolic panel  Result Value Ref Range   Sodium 140 135 - 145 mEq/L   Potassium 5.1 3.5 - 5.1 mEq/L   Chloride 104 96 - 112 mEq/L   CO2 29 19 - 32 mEq/L   Glucose, Bld 104 (H) 70 - 99 mg/dL   BUN 17 6 - 23 mg/dL   Creatinine, Ser 0.67 0.40 - 1.20 mg/dL   Calcium 9.6 8.4 - 10.5 mg/dL   GFR 92.16 >60.00 mL/min  Lipid panel  Result Value Ref Range   Cholesterol 141 0 - 200 mg/dL   Triglycerides 84.0 0.0 - 149.0 mg/dL   HDL 48.30 >39.00 mg/dL   VLDL 16.8 0.0 - 40.0 mg/dL  LDL Cholesterol 76 0 - 99 mg/dL   Total CHOL/HDL Ratio 3    NonHDL 92.57       Assessment & Plan:   Problem List Items Addressed This Visit    Cervical myelopathy with cervical radiculopathy   Chronic venous insufficiency   Hypertension    Chronic, stable. Continue current regimen.       Hypokalemia    Finally improving, now at high range on spironolactone - will decrease Kdur supplementation to BID and recheck K levels in 1 month. Pt agrees with plan.       Relevant Orders   Potassium   Melanoma ([place])    Close monitoring by dermatology.       Pedal edema    Chronic, stable period. Continue lasix 40mg  daily and spironolactone 12.5mg  daily (started 07/2017).       Varicose veins of both lower extremities with complications - Primary    H/o ulcers and superficial phlebitis. Stable period. Continue current regimen.       Vitamin D deficiency    Continue 50,000 IU daily per pt preference. Has not tried OTC supplementation.        Other Visit Diagnoses    Special screening for malignant neoplasms, colon       Relevant Orders   Ambulatory referral to Gastroenterology       Follow up plan: Return in about 3 months (around 01/05/2018) for follow up visit.  [name], MD

## 2017-10-07 NOTE — Assessment & Plan Note (Signed)
Chronic, stable period. Continue lasix 40mg  daily and spironolactone 12.5mg  daily (started 07/2017).

## 2017-10-08 ENCOUNTER — Encounter: Payer: Self-pay | Admitting: Gastroenterology

## 2017-10-17 ENCOUNTER — Other Ambulatory Visit: Payer: Self-pay | Admitting: Family Medicine

## 2017-10-18 ENCOUNTER — Telehealth: Payer: Self-pay | Admitting: Family Medicine

## 2017-10-18 NOTE — Telephone Encounter (Signed)
Copied from [place]. >> Oct 18, 2017 11:59 AM Neva Seat wrote: Dr. Danise Mina told pt he would prescribe the follow Rx below.  He asked her to find out the ointment [facility] has prescribed. Triamcinolone - ointment   [facility] - [phone]

## 2017-10-18 NOTE — Telephone Encounter (Unsigned)
Copied from [place]. Topic: General - Other >> Oct 18, 2017 11:59 AM Neva Seat wrote: Dr. Danise Mina told pt he would prescribe the follow Rx below.  He asked her to find out the ointment [facility] has prescribed. Triamcinolone - ointment   [facility] - [phone]

## 2017-10-21 MED ORDER — TRIAMCINOLONE ACETONIDE 0.5 % EX OINT: 1.0000 "application " | TOPICAL_OINTMENT | Freq: Two times a day (BID) | CUTANEOUS | 1 refills | Status: DC

## 2017-10-21 NOTE — Telephone Encounter (Signed)
I've sent this in to her pharmacy.

## 2017-11-05 DIAGNOSIS — H269 Unspecified cataract: Secondary | ICD-10-CM

## 2017-11-05 HISTORY — PX: CATARACT EXTRACTION W/ INTRAOCULAR LENS IMPLANT: SHX1309

## 2017-11-05 HISTORY — DX: Unspecified cataract: H26.9

## 2017-11-07 ENCOUNTER — Other Ambulatory Visit (INDEPENDENT_AMBULATORY_CARE_PROVIDER_SITE_OTHER): Payer: Medicare Other

## 2017-11-07 DIAGNOSIS — E876 Hypokalemia: Secondary | ICD-10-CM

## 2017-11-07 LAB — POTASSIUM: Potassium: 4.3 mEq/L (ref 3.5–5.1)

## 2017-11-13 ENCOUNTER — Telehealth: Payer: Self-pay | Admitting: *Deleted

## 2017-11-13 NOTE — Telephone Encounter (Signed)
[name],  In 08/2015 and 06/2016 she was deemed a difficult intubation and intubated with a video laryngoscope.  Consequently we must follow this diagnosis and she does not qualify for care at [facility].  Thanks,  Osvaldo Angst

## 2017-11-13 NOTE — Telephone Encounter (Signed)
John,  Good Morning. I was reviewing this chart for a pt that's scheduled for a PV 1-18 with a direct colon with Dr [name] 2-1 Friday. In her chart under history it states difficult intubation but beside this in capital letters it states pt denies.  She did say she has had difficulty waking post op due to sleep apnea.   Please review and let me know if she id okay for the [facility]  Thanks for your time, Marijean Niemann

## 2017-11-14 NOTE — Telephone Encounter (Signed)
Thanks for the note.  She can be directly scheduled on my [facility] endo schedule.  My next available there is sometime in March.  - HD

## 2017-11-14 NOTE — Telephone Encounter (Signed)
Dr [name],  This pt , per Jenny Reichmann , due to her difficult intubation cannot have her procedure in the [facility]-  She has GI hx from 10 yrs ago in [place]- She has no GI hx here- Do you want an OV with her or can she be direct at [facility]?  Please advise, Marijean Niemann

## 2017-11-14 NOTE — Telephone Encounter (Signed)
[name]- Can you please schedule this for me?  Thanks , Lelan Pons PV

## 2017-11-15 ENCOUNTER — Other Ambulatory Visit: Payer: Self-pay

## 2017-11-15 DIAGNOSIS — Z1211 Encounter for screening for malignant neoplasm of colon: Secondary | ICD-10-CM

## 2017-11-15 NOTE — Telephone Encounter (Signed)
[name], Patient is scheduled at [facility] for 01/06/18 at 8:30 arrival at 6:00 am. I will put the admissions order through if you could do the ambulatory referral whenever patient comes in for PV. Thanks, Almyra Free

## 2017-11-22 ENCOUNTER — Telehealth: Payer: Self-pay | Admitting: *Deleted

## 2017-11-22 ENCOUNTER — Other Ambulatory Visit: Payer: Self-pay

## 2017-11-22 ENCOUNTER — Ambulatory Visit (AMBULATORY_SURGERY_CENTER): Payer: Self-pay | Admitting: *Deleted

## 2017-11-22 VITALS — Ht 62.5 in | Wt 222.0 lb

## 2017-11-22 DIAGNOSIS — Z1211 Encounter for screening for malignant neoplasm of colon: Secondary | ICD-10-CM

## 2017-11-22 NOTE — Progress Notes (Signed)
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

## 2017-11-22 NOTE — Telephone Encounter (Signed)
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 She asked if we could call her and just give her the open options??  Thanks a lot, Lelan Pons

## 2017-11-25 ENCOUNTER — Other Ambulatory Visit: Payer: Self-pay | Admitting: Family Medicine

## 2017-11-25 NOTE — Telephone Encounter (Signed)
Called [facility] scheduling, changed patient to last of the day. Start time now 12:00 pm, arrival at 10:30 am. Date is still 01/06/18. Called patient lvm to call back to get new time. I have printed off and mailed prep instructions with corrected times to start prep.

## 2017-11-27 ENCOUNTER — Telehealth: Payer: Self-pay

## 2017-11-27 NOTE — Telephone Encounter (Signed)
Left message for patient that time is changed for her procedure, mailed new set of instruction. If she has questions to please call our office.

## 2017-12-06 ENCOUNTER — Encounter: Payer: Medicare Other | Admitting: Gastroenterology

## 2017-12-10 DIAGNOSIS — H35362 Drusen (degenerative) of macula, left eye: Secondary | ICD-10-CM | POA: Diagnosis not present

## 2017-12-23 DIAGNOSIS — D3132 Benign neoplasm of left choroid: Secondary | ICD-10-CM | POA: Diagnosis not present

## 2017-12-23 DIAGNOSIS — H25813 Combined forms of age-related cataract, bilateral: Secondary | ICD-10-CM | POA: Diagnosis not present

## 2017-12-23 DIAGNOSIS — H524 Presbyopia: Secondary | ICD-10-CM | POA: Diagnosis not present

## 2017-12-23 DIAGNOSIS — H5231 Anisometropia: Secondary | ICD-10-CM | POA: Diagnosis not present

## 2017-12-26 ENCOUNTER — Ambulatory Visit (INDEPENDENT_AMBULATORY_CARE_PROVIDER_SITE_OTHER): Payer: Medicare Other | Admitting: Podiatry

## 2017-12-26 ENCOUNTER — Encounter: Payer: Self-pay | Admitting: Podiatry

## 2017-12-26 DIAGNOSIS — B351 Tinea unguium: Secondary | ICD-10-CM

## 2017-12-26 DIAGNOSIS — M79676 Pain in unspecified toe(s): Secondary | ICD-10-CM | POA: Diagnosis not present

## 2017-12-26 DIAGNOSIS — I872 Venous insufficiency (chronic) (peripheral): Secondary | ICD-10-CM

## 2017-12-26 NOTE — Progress Notes (Signed)
Complaint:  Visit Type: Patient returns to my office for continued preventative foot care services. Complaint: Patient states" my nails have grown long and thick and become painful to walk and wear shoes"  The patient presents for preventative foot care services. No changes to ROS.  Patient says the burning in her feet has stopped wearing the powerstep insoles. Her ankle pain and swelling persists.  Podiatric Exam: Vascular: dorsalis pedis and posterior tibial pulses are palpable bilateral. Capillary return is immediate. Temperature gradient is WNL. Skin turgor WNL Varicosities  Feet  B/L Sensorium: Normal Semmes Weinstein monofilament test. Normal tactile sensation bilaterally. Nail Exam: Pt has thick disfigured discolored nails with subungual debris noted bilateral entire nail hallux through fifth toenails Ulcer Exam: There is no evidence of ulcer or pre-ulcerative changes or infection. Orthopedic Exam: Muscle tone and strength are WNL. No limitations in general ROM. No crepitus or effusions noted. Foot type and digits show no abnormalities. Bony prominences are unremarkable. Swelling ankles  B/L Skin: No Porokeratosis. No infection or ulcers.  Asymptomatic clavi.  Diagnosis:  Onychomycosis, , Pain in right toe, pain in left toes  Treatment & Plan Procedures and Treatment: Consent by patient was obtained for treatment procedures. The patient understood the discussion of treatment and procedures well. All questions were answered thoroughly reviewed. Debridement of mycotic and hypertrophic toenails, 1 through 5 bilateral and clearing of subungual debris. No ulceration, no infection noted. Patient requests minimal  dremel useleft hallux. Return Visit-Office Procedure: Patient instructed to return to the office for a follow up visit 3 months for continued evaluation and treatment.    Gardiner Barefoot DPM

## 2018-01-01 ENCOUNTER — Other Ambulatory Visit: Payer: Self-pay

## 2018-01-01 ENCOUNTER — Encounter (HOSPITAL_COMMUNITY): Payer: Self-pay

## 2018-01-03 ENCOUNTER — Other Ambulatory Visit: Payer: Self-pay

## 2018-01-06 ENCOUNTER — Ambulatory Visit (HOSPITAL_COMMUNITY): Payer: Medicare Other | Admitting: Anesthesiology

## 2018-01-06 ENCOUNTER — Encounter (HOSPITAL_COMMUNITY)
Admission: RE | Disposition: A | Discharge status: Home / Self Care | Payer: Self-pay | Source: Ambulatory Visit | Attending: Gastroenterology

## 2018-01-06 ENCOUNTER — Ambulatory Visit: Payer: Medicare Other | Admitting: Family Medicine

## 2018-01-06 ENCOUNTER — Other Ambulatory Visit: Payer: Self-pay

## 2018-01-06 ENCOUNTER — Ambulatory Visit (HOSPITAL_COMMUNITY)
Admission: RE | Admit: 2018-01-06 | Discharge: 2018-01-06 | Disposition: A | Discharge status: Home / Self Care | Payer: Medicare Other | Source: Ambulatory Visit | Attending: Gastroenterology | Admitting: Gastroenterology

## 2018-01-06 ENCOUNTER — Encounter (HOSPITAL_COMMUNITY): Payer: Self-pay | Admitting: Certified Registered Nurse Anesthetist

## 2018-01-06 DIAGNOSIS — Z91040 Latex allergy status: Secondary | ICD-10-CM | POA: Diagnosis not present

## 2018-01-06 DIAGNOSIS — G4733 Obstructive sleep apnea (adult) (pediatric): Secondary | ICD-10-CM | POA: Diagnosis not present

## 2018-01-06 DIAGNOSIS — Z86718 Personal history of other venous thrombosis and embolism: Secondary | ICD-10-CM | POA: Insufficient documentation

## 2018-01-06 DIAGNOSIS — Z85820 Personal history of malignant melanoma of skin: Secondary | ICD-10-CM | POA: Insufficient documentation

## 2018-01-06 DIAGNOSIS — G2581 Restless legs syndrome: Secondary | ICD-10-CM | POA: Insufficient documentation

## 2018-01-06 DIAGNOSIS — E559 Vitamin D deficiency, unspecified: Secondary | ICD-10-CM | POA: Insufficient documentation

## 2018-01-06 DIAGNOSIS — Z882 Allergy status to sulfonamides status: Secondary | ICD-10-CM | POA: Diagnosis not present

## 2018-01-06 DIAGNOSIS — Z1211 Encounter for screening for malignant neoplasm of colon: Secondary | ICD-10-CM | POA: Diagnosis not present

## 2018-01-06 DIAGNOSIS — K6289 Other specified diseases of anus and rectum: Secondary | ICD-10-CM | POA: Diagnosis not present

## 2018-01-06 DIAGNOSIS — K449 Diaphragmatic hernia without obstruction or gangrene: Secondary | ICD-10-CM | POA: Diagnosis not present

## 2018-01-06 DIAGNOSIS — J45909 Unspecified asthma, uncomplicated: Secondary | ICD-10-CM | POA: Diagnosis not present

## 2018-01-06 DIAGNOSIS — K5730 Diverticulosis of large intestine without perforation or abscess without bleeding: Secondary | ICD-10-CM | POA: Diagnosis not present

## 2018-01-06 DIAGNOSIS — Z91048 Other nonmedicinal substance allergy status: Secondary | ICD-10-CM | POA: Insufficient documentation

## 2018-01-06 DIAGNOSIS — I10 Essential (primary) hypertension: Secondary | ICD-10-CM | POA: Insufficient documentation

## 2018-01-06 DIAGNOSIS — Z87442 Personal history of urinary calculi: Secondary | ICD-10-CM | POA: Insufficient documentation

## 2018-01-06 DIAGNOSIS — Z888 Allergy status to other drugs, medicaments and biological substances status: Secondary | ICD-10-CM | POA: Diagnosis not present

## 2018-01-06 DIAGNOSIS — I872 Venous insufficiency (chronic) (peripheral): Secondary | ICD-10-CM | POA: Insufficient documentation

## 2018-01-06 DIAGNOSIS — Z87891 Personal history of nicotine dependence: Secondary | ICD-10-CM | POA: Diagnosis not present

## 2018-01-06 DIAGNOSIS — C439 Malignant melanoma of skin, unspecified: Secondary | ICD-10-CM | POA: Diagnosis not present

## 2018-01-06 HISTORY — PX: COLONOSCOPY WITH PROPOFOL: SHX5780

## 2018-01-06 SURGERY — COLONOSCOPY WITH PROPOFOL
Anesthesia: Monitor Anesthesia Care

## 2018-01-06 MED ORDER — SODIUM CHLORIDE 0.9 % IV SOLN: INTRAVENOUS | Status: DC

## 2018-01-06 MED ORDER — PROPOFOL 500 MG/50ML IV EMUL
INTRAVENOUS | Status: DC | PRN
  Administered 2018-01-06: 125 ug/kg/min via INTRAVENOUS

## 2018-01-06 MED ORDER — PROPOFOL 10 MG/ML IV BOLUS
INTRAVENOUS | Status: AC
  Filled 2018-01-06: qty 40

## 2018-01-06 MED ORDER — PROPOFOL 10 MG/ML IV BOLUS
INTRAVENOUS | Status: DC | PRN
  Administered 2018-01-06: 60 mg via INTRAVENOUS

## 2018-01-06 MED ORDER — LACTATED RINGERS IV SOLN
INTRAVENOUS | Status: DC
  Administered 2018-01-06: 11:00:00 via INTRAVENOUS
  Administered 2018-01-06: 1000 mL via INTRAVENOUS

## 2018-01-06 SURGICAL SUPPLY — 21 items
ELECT REM PT RETURN 9FT ADLT (ELECTROSURGICAL)
ELECTRODE REM PT RTRN 9FT ADLT (ELECTROSURGICAL) IMPLANT
FCP BXJMBJMB 240X2.8X (CUTTING FORCEPS)
FLOOR PAD 36X40 (MISCELLANEOUS) ×2
FORCEPS BIOP RAD 4 LRG CAP 4 (CUTTING FORCEPS) IMPLANT
FORCEPS BIOP RJ4 240 W/NDL (CUTTING FORCEPS)
FORCEPS BXJMBJMB 240X2.8X (CUTTING FORCEPS) IMPLANT
INJECTOR/SNARE I SNARE (MISCELLANEOUS) IMPLANT
LUBRICANT JELLY 4.5OZ STERILE (MISCELLANEOUS) IMPLANT
MANIFOLD NEPTUNE II (INSTRUMENTS) IMPLANT
NEEDLE SCLEROTHERAPY 25GX240 (NEEDLE) IMPLANT
PAD FLOOR 36X40 (MISCELLANEOUS) ×1 IMPLANT
PROBE APC STR FIRE (PROBE) IMPLANT
PROBE INJECTION GOLD (MISCELLANEOUS)
PROBE INJECTION GOLD 7FR (MISCELLANEOUS) IMPLANT
SNARE ROTATE MED OVAL 20MM (MISCELLANEOUS) IMPLANT
SYR 50ML LL SCALE MARK (SYRINGE) IMPLANT
TRAP SPECIMEN MUCOUS 40CC (MISCELLANEOUS) IMPLANT
TUBING ENDO SMARTCAP PENTAX (MISCELLANEOUS) IMPLANT
TUBING IRRIGATION ENDOGATOR (MISCELLANEOUS) ×2 IMPLANT
WATER STERILE IRR 1000ML POUR (IV SOLUTION) IMPLANT

## 2018-01-06 NOTE — Interval H&P Note (Signed)
History and Physical Interval Note:  01/06/2018 11:34 AM  [name]  has presented today for surgery, with the diagnosis of screening colonoscopy  The various methods of treatment have been discussed with the patient and family. After consideration of risks, benefits and other options for treatment, the patient has consented to  Procedure(s): COLONOSCOPY WITH PROPOFOL (N/A) as a surgical intervention .  The patient's history has been reviewed, patient examined, no change in status, stable for surgery.  I have reviewed the patient's chart and labs.  Questions were answered to the patient's satisfaction.     Nelida Meuse III

## 2018-01-06 NOTE — Discharge Instructions (Signed)
YOU HAD AN ENDOSCOPIC PROCEDURE TODAY: Refer to the procedure report and other information in the discharge instructions given to you for any specific questions about what was found during the examination. If this information does not answer your questions, please call [place] office at [phone] to clarify.   YOU SHOULD EXPECT: Some feelings of bloating in the abdomen. Passage of more gas than usual. Walking can help get rid of the air that was put into your GI tract during the procedure and reduce the bloating. If you had a lower endoscopy (such as a colonoscopy or flexible sigmoidoscopy) you may notice spotting of blood in your stool or on the toilet paper. Some abdominal soreness may be present for a day or two, also.  DIET: Your first meal following the procedure should be a light meal and then it is ok to progress to your normal diet. A half-sandwich or bowl of soup is an example of a good first meal. Heavy or fried foods are harder to digest and may make you feel nauseous or bloated. Drink plenty of fluids but you should avoid alcoholic beverages for 24 hours. If you had a esophageal dilation, please see attached instructions for diet.    ACTIVITY: Your care partner should take you home directly after the procedure. You should plan to take it easy, moving slowly for the rest of the day. You can resume normal activity the day after the procedure however YOU SHOULD NOT DRIVE, use power tools, machinery or perform tasks that involve climbing or major physical exertion for 24 hours (because of the sedation medicines used during the test).   SYMPTOMS TO REPORT IMMEDIATELY: A gastroenterologist can be reached at any hour. Please call [phone]  for any of the following symptoms:  Following lower endoscopy (colonoscopy, flexible sigmoidoscopy) Excessive amounts of blood in the stool  Significant tenderness, worsening of abdominal pains  Swelling of the abdomen that is new, acute  Fever of 100 or  higher   FOLLOW UP:  If any biopsies were taken you will be contacted by phone or by letter within the next 1-3 weeks. Call [phone]  if you have not heard about the biopsies in 3 weeks.  Please also call with any specific questions about appointments or follow up tests.  

## 2018-01-06 NOTE — Anesthesia Postprocedure Evaluation (Signed)
Anesthesia Post Note  Patient: [name]  Procedure(s) Performed: COLONOSCOPY WITH PROPOFOL (N/A )     Patient location during evaluation: PACU Anesthesia Type: MAC Level of consciousness: awake and alert Pain management: pain level controlled Vital Signs Assessment: post-procedure vital signs reviewed and stable Respiratory status: spontaneous breathing, nonlabored ventilation, respiratory function stable and patient connected to nasal cannula oxygen Cardiovascular status: stable and blood pressure returned to baseline Postop Assessment: no apparent nausea or vomiting Anesthetic complications: no    Last Vitals:  Vitals:   01/06/18 1104 01/06/18 1202  BP: 116/78 (!) 107/59  Pulse: 67 73  Resp: 13 18  Temp: 36.7 C   SpO2: 100% 100%    Last Pain:  Vitals:   01/06/18 1202  TempSrc: Oral                 Joevon Holliman S

## 2018-01-06 NOTE — Anesthesia Preprocedure Evaluation (Signed)
Anesthesia Evaluation  Patient identified by MRN, date of birth, ID band Patient awake    Reviewed: Allergy & Precautions, NPO status , Patient's Chart, lab work & pertinent test results  Airway Mallampati: II  TM Distance: >3 FB Neck ROM: Full    Dental no notable dental hx.    Pulmonary asthma , sleep apnea and Continuous Positive Airway Pressure Ventilation , former smoker,    Pulmonary exam normal breath sounds clear to auscultation       Cardiovascular hypertension, Pt. on medications Normal cardiovascular exam Rhythm:Regular Rate:Normal     Neuro/Psych negative neurological ROS  negative psych ROS   GI/Hepatic Neg liver ROS, hiatal hernia,   Endo/Other  negative endocrine ROS  Renal/GU negative Renal ROS  negative genitourinary   Musculoskeletal negative musculoskeletal ROS (+)   Abdominal   Peds negative pediatric ROS (+)  Hematology negative hematology ROS (+)   Anesthesia Other Findings   Reproductive/Obstetrics negative OB ROS                             Anesthesia Physical Anesthesia Plan  ASA: III  Anesthesia Plan: MAC   Post-op Pain Management:    Induction: Intravenous  PONV Risk Score and Plan: 0  Airway Management Planned: Simple Face Mask  Additional Equipment:   Intra-op Plan:   Post-operative Plan:   Informed Consent: I have reviewed the patients History and Physical, chart, labs and discussed the procedure including the risks, benefits and alternatives for the proposed anesthesia with the patient or authorized representative who has indicated his/her understanding and acceptance.   Dental advisory given  Plan Discussed with: CRNA and Surgeon  Anesthesia Plan Comments:         Anesthesia Quick Evaluation

## 2018-01-06 NOTE — H&P (Signed)
History:  This patient presents for endoscopic testing for colon cancer screening.  [name] Referring physician: [name], MD  Past Medical History: Past Medical History:  Diagnosis Date  . Allergy   . Anesthesia complication    trouble waking up 2/2 CPAP  . Arthritis   . Asthma in adult   . Cataract    have them   . Cervical spondylosis 2012   Jacelyn Grip, [employer])  . Choroidal nevus of left eye 03/22/2016  . Chronic venous insufficiency 2016   severe reflux with painful varicosities sees [facility]  . Clotting disorder ([place])    due to vein ablation   . Complication of anesthesia    difficulty coming out due to sleep apnea per pt  . Difficult intubation    PATIENT DENIES   . Diverticulosis    per ct scans 09-2016 and 01-2017  . DVT (deep venous thrombosis) ([place]) 2011   small, developed after venous ablation  . Family history of adverse reaction to anesthesia    O2 levels drop upon waking   . Hearing loss sensory, bilateral 2013   mod-severe high freq sensorineural ([facility])  . Hiatal hernia     09-2016 ct scan HP at cancer center   . History of kidney stones 1980s  . History of radiation therapy 07/19/11-08/25/2011   RIGHT AXILLARY REGION/METASTATIC  . Hypertension   . Melanoma ([place]) 06/29/10   MALIGNANT MELANOMA R SHOULDER/SUPRASCAPULAR BACK s/p interferon chemo and XRT  . Neuromuscular disorder ([facility])    muscle spasms  . OSA (obstructive sleep apnea)    on CPAP  . Pneumonia    2001  . RLS (restless legs syndrome)   . Seasonal allergies   . Sleep apnea    wears cpap  . Tinnitus   . Unspecified vitamin D deficiency 03/18/2013  . Varicose veins of left lower extremity      Past Surgical History: Past Surgical History:  Procedure Laterality Date  . ABI  2016   WNL  . ANTERIOR CERVICAL DECOMP/DISCECTOMY FUSION N/A 08/22/2015   ANTERIOR CERVICAL DECOMPRESSION FUSION C3/4 - interbody graft and anterior plate; Kristeen Miss, MD  . ANTERIOR CERVICAL  DECOMP/DISCECTOMY FUSION N/A 07/02/2016   Procedure: Cervical four- five Cervical five- six Cervical six- seven Anterior cervical decompression/diskectomy/fusion;  Surgeon: Kristeen Miss, MD;  Location: [facility];  Service: Neurosurgery;  Laterality: N/A;  C4-5 C5-6 C6-7 Anterior cervical decompression/diskectomy/fusion  . BREAST BIOPSY Left 2013   BENIGN  . BREAST SURGERY Right 1990   BX   . CARDIOVASCULAR STRESS TEST  2010   normal stress test, EF 66%  . COLONOSCOPY  03/2005   benign polyp, int hem, rpt 5 yrs ([place]  . DEEP AXILLARY SENTINEL NODE BIOPSY / EXCISION  2012   RIGHT  . DEXA  06/2016   WNL  . DILATION AND CURETTAGE OF UTERUS  2010  . ENDOVENOUS ABLATION SAPHENOUS VEIN W/ LASER Left 2010   [facility]  . ENDOVENOUS ABLATION SAPHENOUS VEIN W/ LASER Left 2016   accessory branch [facility] Kellie Simmering)  . ESOPHAGOGASTRODUODENOSCOPY  2006   mod chronic carditis, no H pylori ([place], [place])  . [place]   left  . LUMBAR LAMINECTOMY  2001   L4/5  . MELANOMA EXCISION  2011   Right shoulder, with sentinel lymph node biopsy  . PORT-A-CATH REMOVAL  12/05/2011   Procedure: REMOVAL PORT-A-CATH;  Surgeon: Rolm Bookbinder, MD;  Location: [place];  Service: General;  Laterality: N/A;  left port removal  . PORTACATH PLACEMENT     left subclavian  . UPPER GASTROINTESTINAL ENDOSCOPY      Allergies: Allergies  Allergen Reactions  . Sulfa Antibiotics Itching, Swelling and Shortness Of Breath    Facial swelling  . Voltaren [Diclofenac Sodium] Shortness Of Breath  . Latex Other (See Comments)    Blisters ONLY HAD REACTION TO TAPE  (??? ONLY ADHESIVE ALLERGY)  . Tape Other (See Comments)    Caused blisters - must use paper tape - same reaction from NeoPrene  . Other     Neoprene    Outpatient Meds: Current Facility-Administered Medications  Medication Dose Route Frequency Provider Last Rate Last Dose  . 0.9 %  sodium chloride infusion   Intravenous  Continuous Danis, Estill [name] III, MD      . lactated ringers infusion   Intravenous Continuous Nelida Meuse III, MD 20 mL/hr at 01/06/18 1121 1,000 mL at 01/06/18 1121      ___________________________________________________________________ Objective   Exam:  BP 116/78   Pulse 67   Temp 98 F (36.7 C) (Oral)   Resp 13   Ht 5\' 2"  (1.575 m)   Wt 220 lb (99.8 kg)   SpO2 100%   BMI 40.24 kg/m    CV: RRR without murmur, S1/S2, no JVD, no peripheral edema  Resp: clear to auscultation bilaterally, normal RR and effort noted  GI: soft, no tenderness, with active bowel sounds. No guarding or palpable organomegaly noted.  Neuro: awake, alert and oriented x 3. Normal gross motor function and fluent speech   Assessment:  Colon cancer screening  Plan:  colonoscopy   Nelida Meuse III

## 2018-01-06 NOTE — Op Note (Signed)
[facility] Patient Name: [name] Procedure Date: 01/06/2018 MRN: [id number] Attending MD: [name]. [name] , MD Date of Birth: 1946/08/09 CSN: [id number] Age: 72 Admit Type: Outpatient Procedure:                Colonoscopy Indications:              Screening for colorectal malignant neoplasm (no                            polyps on 2006 colonoscopy) Providers:                Amber Mussel L. Amber Carrow, MD, Amber Junes, RN, [name], Technician Referring MD:             Amber Holter, MD Medicines:                Monitored Anesthesia Care Complications:            No immediate complications. Estimated Blood Loss:     Estimated blood loss: none. Procedure:                Pre-Anesthesia Assessment:                           - Prior to the procedure, a History and Physical                            was performed, and patient medications and                            allergies were reviewed. The patient's tolerance of                            previous anesthesia was also reviewed. The risks                            and benefits of the procedure and the sedation                            options and risks were discussed with the patient.                            All questions were answered, and informed consent                            was obtained. Prior Anticoagulants: The patient has                            taken aspirin, last dose was 5 days prior to                            procedure. ASA Grade Assessment: III - A patient  with severe systemic disease. After reviewing the                            risks and benefits, the patient was deemed in                            satisfactory condition to undergo the procedure.                           After obtaining informed consent, the colonoscope                            was passed under direct vision. Throughout the                            procedure,  the patient's blood pressure, pulse, and                            oxygen saturations were monitored continuously. The                            EC-[id number] ([id number]) scope was introduced through                            the anus and advanced to the the cecum, identified                            by appendiceal orifice and ileocecal valve. The                            colonoscopy was performed without difficulty. The                            patient tolerated the procedure well. The quality                            of the bowel preparation was excellent. The                            ileocecal valve, appendiceal orifice, and rectum                            were photographed. Scope In: 11:40:01 AM Scope Out: 11:56:36 AM Scope Withdrawal Time: 0 hours 9 minutes 54 seconds  Total Procedure Duration: 0 hours 16 minutes 35 seconds  Findings:      The digital rectal exam findings include decreased sphincter tone.      Multiple small-mouthed diverticula were found in the left colon and       right colon.      The exam was otherwise without abnormality on direct and retroflexion       views.      Withdrawal time > 8 minutes. Impression:               - Decreased sphincter tone found on digital rectal  exam.                           - Diverticulosis in the left colon and in the right                            colon.                           - The examination was otherwise normal on direct                            and retroflexion views.                           - No specimens collected. Moderate Sedation:      MAC sedation used Recommendation:           - Patient has a contact number available for                            emergencies. The signs and symptoms of potential                            delayed complications were discussed with the                            patient. Return to normal activities tomorrow.                             Written discharge instructions were provided to the                            patient.                           - Resume previous diet.                           - Continue present medications.                           - No repeat colonoscopy due to age. Procedure Code(s):        --- Professional ---                           [phone], Colonoscopy, flexible; diagnostic, including                            collection of specimen(s) by brushing or washing,                            when performed (separate procedure) Diagnosis Code(s):        --- Professional ---                           Z12.11, Encounter for screening for malignant  neoplasm of colon                           K62.89, Other specified diseases of anus and rectum                           K57.30, Diverticulosis of large intestine without                            perforation or abscess without bleeding CPT copyright 2016 American Medical Association. All rights reserved. The codes documented in this report are preliminary and upon coder review may  be revised to meet current compliance requirements. [name] L. Amber Carrow, MD 01/06/2018 12:02:32 PM This report has been signed electronically. Number of Addenda: 0

## 2018-01-06 NOTE — Transfer of Care (Signed)
Immediate Anesthesia Transfer of Care Note  Patient: [name]  Procedure(s) Performed: COLONOSCOPY WITH PROPOFOL (N/A )  Patient Location: PACU and Endoscopy Unit  Anesthesia Type:MAC  Level of Consciousness: awake, alert  and oriented  Airway & Oxygen Therapy: Patient Spontanous Breathing and Patient connected to face mask oxygen  Post-op Assessment: Report given to RN and Post -op Vital signs reviewed and stable  Post vital signs: Reviewed and stable  Last Vitals:  Vitals:   01/06/18 1104  BP: 116/78  Pulse: 67  Resp: 13  Temp: 36.7 C  SpO2: 100%    Last Pain:  Vitals:   01/06/18 1104  TempSrc: Oral         Complications: No apparent anesthesia complications

## 2018-01-08 ENCOUNTER — Encounter (HOSPITAL_COMMUNITY): Payer: Self-pay | Admitting: Gastroenterology

## 2018-01-13 ENCOUNTER — Encounter: Payer: Self-pay | Admitting: Family Medicine

## 2018-01-13 ENCOUNTER — Ambulatory Visit (INDEPENDENT_AMBULATORY_CARE_PROVIDER_SITE_OTHER): Payer: Medicare Other | Admitting: Family Medicine

## 2018-01-13 VITALS — BP 120/68 | HR 66 | Temp 98.7°F | Wt 220.0 lb

## 2018-01-13 DIAGNOSIS — I872 Venous insufficiency (chronic) (peripheral): Secondary | ICD-10-CM | POA: Diagnosis not present

## 2018-01-13 DIAGNOSIS — E876 Hypokalemia: Secondary | ICD-10-CM

## 2018-01-13 DIAGNOSIS — E559 Vitamin D deficiency, unspecified: Secondary | ICD-10-CM

## 2018-01-13 DIAGNOSIS — I159 Secondary hypertension, unspecified: Secondary | ICD-10-CM | POA: Diagnosis not present

## 2018-01-13 NOTE — Assessment & Plan Note (Signed)
Stable period on current regimen - continue.  

## 2018-01-13 NOTE — Patient Instructions (Addendum)
You are doing well today! Continue current medicines. Return in 9 months for medicare wellness visit with Katha Cabal and follow up visit with me.  Try [phone] [facility] for more local resources

## 2018-01-13 NOTE — Progress Notes (Signed)
BP 120/68 (BP Location: Left Arm, Patient Position: Sitting, Cuff Size: Large)   Pulse 66   Temp 98.7 F (37.1 C) (Oral)   Wt 220 lb (99.8 kg)   SpO2 96%   BMI 40.24 kg/m    CC: 3 mo f/u visit Subjective:    Patient ID: [name], female    DOB: November 17, 1945, 72 y.o.   MRN: [id number]  HPI: [name] is a 72 y.o. female presenting on 01/13/2018 for 3 mo follow-up   Hypokalemia - stable on KDur BID, spironolactone 12.5mg  daily, lasix 40mg  daily.   L>R CVI - doing better the last few months on current regimen - has also been more ambulatory recently. Using knee high mild compression socks ([employer]anes brand). Voltaren gel causes wheezing.  TCI cream helps.  Would also like to use arnicare cream which has helped. Vaseline/vit E mix helps a lot.   Vit D deficiency - well repleted with 50,000 units weekly. Desires to continue current regimen.   Relevant past medical, surgical, family and social history reviewed and updated as indicated. Interim medical history since our last visit reviewed. Allergies and medications reviewed and updated. Outpatient Medications Prior to Visit  Medication Sig Dispense Refill  . acetaminophen (TYLENOL ARTHRITIS PAIN) 650 MG CR tablet Take 1,300 mg by mouth every 8 (eight) hours as needed for pain.     Marland Kitchen albuterol (PROAIR HFA) 108 (90 Base) MCG/ACT inhaler INHALE 2 PUFFS INTO LUNGS EVERY 6 HOURS AS NEEDED FOR WHEEZING OR SHORTNESS OF BREATH 8.5 Inhaler 1  . albuterol (PROVENTIL) (2.5 MG/3ML) 0.083% nebulizer solution Take 3 mLs (2.5 mg total) by nebulization every 6 (six) hours as needed for wheezing or shortness of breath. 150 mL 1  . aspirin EC 81 MG tablet Take 81 mg by mouth at bedtime. Melanoma prevention    . cetirizine (ZYRTEC) 10 MG tablet Take 10 mg by mouth daily.    . furosemide (LASIX) 40 MG tablet TAKE 1 TABLET BY MOUTH EVERY DAY 30 tablet 3  . gabapentin (NEURONTIN) 100 MG capsule TAKE 1 CAPSULE (100 MG TOTAL) BY MOUTH AT BEDTIME. 30  capsule 3  . KLOR-CON M20 20 MEQ tablet TAKE 1 TABLET BY MOUTH THREE TIMES A DAY (Patient taking differently: Take 20 meq by mouth 2 times daily) 90 tablet 2  . losartan (COZAAR) 50 MG tablet TAKE 1 TABLET EVERY DAY 30 tablet 2  . montelukast (SINGULAIR) 10 MG tablet TAKE 1 TABLET BY MOUTH DAILY (Patient taking differently: TAKE 1 TABLET BY MOUTH DAILY AT NIGHT) 30 tablet 2  . Multiple Vitamin (MULTIVITAMIN WITH MINERALS) TABS tablet Take 1 tablet by mouth daily.    . Polyvinyl Alcohol-Povidone (TEARS PLUS OP) Place 1 drop into both eyes daily as needed (dry eyes/ redness/ burning).    . potassium chloride SA (K-DUR,KLOR-CON) 20 MEQ tablet Take 1 tablet (20 mEq total) by mouth 2 (two) times daily. 60 tablet 6  . PRESCRIPTION MEDICATION CPAP    . Probiotic Product (PROBIOTIC DAILY PO) Take 1 tablet by mouth daily.     . Simethicone (GAS-X PO) Take 1 tablet by mouth daily as needed (gas).    Marland Kitchen spironolactone (ALDACTONE) 25 MG tablet TAKE 1/2 TABLET BY MOUTH DAILY. 15 tablet 2  . triamcinolone ointment (KENALOG) 0.5 % Apply 1 application topically 2 (two) times daily. 30 g 1  . Vitamin D, Ergocalciferol, (DRISDOL) 50000 units CAPS capsule TAKE 1 CAPSULE (50,000 UNITS TOTAL) BY MOUTH EVERY 7 (SEVEN) DAYS. 12  capsule 3   No facility-administered medications prior to visit.      Per HPI unless specifically indicated in ROS section below Review of Systems     Objective:    BP 120/68 (BP Location: Left Arm, Patient Position: Sitting, Cuff Size: Large)   Pulse 66   Temp 98.7 F (37.1 C) (Oral)   Wt 220 lb (99.8 kg)   SpO2 96%   BMI 40.24 kg/m   Wt Readings from Last 3 Encounters:  01/13/18 220 lb (99.8 kg)  01/06/18 220 lb (99.8 kg)  11/22/17 222 lb (100.7 kg)    Physical Exam  Constitutional: She appears well-developed and well-nourished. No distress.  HENT:  Mouth/Throat: Oropharynx is clear and moist. No oropharyngeal exudate.  Eyes: Conjunctivae and EOM are normal. Pupils are  equal, round, and reactive to light. No scleral icterus.  Neck: Normal range of motion. Neck supple.  Cardiovascular: Normal rate, regular rhythm, normal heart sounds and intact distal pulses.  No murmur heard. Pulmonary/Chest: Effort normal and breath sounds normal. No respiratory distress. She has no wheezes. She has no rales.  Musculoskeletal: She exhibits no edema.  Skin: Skin is warm and dry. No rash noted.  Psychiatric: She has a normal mood and affect.  Nursing note and vitals reviewed.  Results for orders placed or performed in visit on 11/07/17  Potassium  Result Value Ref Range   Potassium 4.3 3.5 - 5.1 mEq/L      Assessment & Plan:   Problem List Items Addressed This Visit    Chronic venous insufficiency    Stable period on current regimen - continue. She is tolerating mild compression socks better.       Hypertension    Chronic, stable. Continue current regimen.       Hypokalemia    Stable period on current regimen - continue.       Vitamin D deficiency    Continue weekly supplementation.           No orders of the defined types were placed in this encounter.  No orders of the defined types were placed in this encounter.   Follow up plan: Return in about 6 months (around 07/16/2018) for follow up visit.  [name], MD

## 2018-01-13 NOTE — Assessment & Plan Note (Signed)
Continue weekly supplementation.

## 2018-01-13 NOTE — Assessment & Plan Note (Signed)
Stable period on current regimen - continue. She is tolerating mild compression socks better.

## 2018-01-13 NOTE — Assessment & Plan Note (Signed)
Chronic, stable. Continue current regimen. 

## 2018-01-16 ENCOUNTER — Encounter: Payer: Self-pay | Admitting: Pulmonary Disease

## 2018-01-16 ENCOUNTER — Ambulatory Visit (INDEPENDENT_AMBULATORY_CARE_PROVIDER_SITE_OTHER): Payer: Medicare Other | Admitting: Pulmonary Disease

## 2018-01-16 VITALS — BP 120/74 | HR 63 | Ht 62.0 in | Wt 221.0 lb

## 2018-01-16 DIAGNOSIS — K219 Gastro-esophageal reflux disease without esophagitis: Secondary | ICD-10-CM | POA: Diagnosis not present

## 2018-01-16 DIAGNOSIS — G4733 Obstructive sleep apnea (adult) (pediatric): Secondary | ICD-10-CM | POA: Diagnosis not present

## 2018-01-16 DIAGNOSIS — J4531 Mild persistent asthma with (acute) exacerbation: Secondary | ICD-10-CM | POA: Diagnosis not present

## 2018-01-16 DIAGNOSIS — J301 Allergic rhinitis due to pollen: Secondary | ICD-10-CM | POA: Diagnosis not present

## 2018-01-16 NOTE — Progress Notes (Signed)
Subjective:   PATIENT ID: [name] GENDER: female DOB: 11-23-1945, MRN: [id number]  Synopsis: Former patient of Dr. Elsworth Soho with asthma an OSA HPI  Chief Complaint  Patient presents with  . Follow-up    former RA pt being treated for OSA, Asthma.     [name] says she is doing "good". She has more seasonal allergies right now which is mor than normal.  She has noted some "upper respiratory" wheezing associated with a dry throat.  She says she takes Zyrtec daily and when she added flonase back this made it all go away.  About two or three days ago she actually needed her nebulizer however because her wheezing came back.  She says that she is allergic to red cedar.  She says that the nebulizer treatments she gave herself a couple of weeks ago was the first time she had used them in a year.  She had to go to the ER in the end to get breathing treatments and prednisone (early 2018).  She says that she doesn't have problems with her CPAP machine but she will have some trouble sleeping if and when she needed the treatment for her melanoma.  She assumed that the medicine caused the problems.  Ever since then she will have some trouble sleeping for a week at a time.    Past Medical History:  Diagnosis Date  . Allergy   . Anesthesia complication    trouble waking up 2/2 CPAP  . Arthritis   . Asthma in adult   . Cataract    have them   . Cervical spondylosis 2012   [facility] [place])  . Choroidal nevus of left eye 03/22/2016  . Chronic venous insufficiency 2016   severe reflux with painful varicosities sees [facility]  . Clotting disorder ([place])    due to vein ablation   . Complication of anesthesia    difficulty coming out due to sleep apnea per pt  . Difficult intubation    PATIENT DENIES   . Diverticulosis    per ct scans 09-2016 and 01-2017  . DVT (deep venous thrombosis) ([place]) 2011   small, developed after venous ablation  . Family history of adverse reaction to anesthesia    O2 levels  drop upon waking   . Hearing loss sensory, bilateral 2013   mod-severe high freq sensorineural ([facility])  . Hiatal hernia     09-2016 ct scan HP at cancer center   . History of kidney stones 1980s  . History of radiation therapy 07/19/11-08/25/2011   RIGHT AXILLARY REGION/METASTATIC  . Hypertension   . Melanoma ([place]) 06/29/10   MALIGNANT MELANOMA R SHOULDER/SUPRASCAPULAR BACK s/p interferon chemo and XRT  . Neuromuscular disorder ([facility])    muscle spasms  . OSA (obstructive sleep apnea)    on CPAP  . Pneumonia    2001  . RLS (restless legs syndrome)   . Seasonal allergies   . Sleep apnea    wears cpap  . Tinnitus   . Unspecified vitamin D deficiency 03/18/2013  . Varicose veins of left lower extremity      Family History  Problem Relation Age of Onset  . Cancer Mother 53       uterine  . CAD Father 63       MI  . Hypertension Father 32  . Alzheimer's disease Father 33  . Cancer Cousin        x2, breast  . Diabetes Sister   .  Cancer Paternal Grandmother        melanoma, possibly  . Colon cancer Neg Hx   . Colon polyps Neg Hx   . Rectal cancer Neg Hx   . Stomach cancer Neg Hx      Social History   Socioeconomic History  . Marital status: Divorced    Spouse name: Not on file  . Number of children: 0  . Years of education: Not on file  . Highest education level: Not on file  Social Needs  . Financial resource strain: Not on file  . Food insecurity - worry: Not on file  . Food insecurity - inability: Not on file  . Transportation needs - medical: Not on file  . Transportation needs - non-medical: Not on file  Occupational History  . Occupation: retired    Fish farm manager: RETIRED    Comment: Government social research officer   Tobacco Use  . Smoking status: Former Smoker    Types: Cigarettes    Last attempt to quit: 11/05/1965    Years since quitting: 52.2  . Smokeless tobacco: Never Used  . Tobacco comment: socially as a teen  Substance and Sexual Activity  . Alcohol use:  No    Alcohol/week: 0.0 oz  . Drug use: No  . Sexual activity: No  Other Topics Concern  . Not on file  Social History Narrative   Lives alone.  Sister and husband live next door   Lives in [place].   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   Edu: some college   Diet: poor - good water, rare fruits/vegetables     Allergies  Allergen Reactions  . Sulfa Antibiotics Itching, Swelling and Shortness Of Breath    Facial swelling  . Voltaren [Diclofenac Sodium] Shortness Of Breath  . Latex Other (See Comments)    Blisters ONLY HAD REACTION TO TAPE  (??? ONLY ADHESIVE ALLERGY)  . Tape Other (See Comments)    Caused blisters - must use paper tape - same reaction from NeoPrene  . Other     Neoprene     Outpatient Medications Prior to Visit  Medication Sig Dispense Refill  . acetaminophen (TYLENOL ARTHRITIS PAIN) 650 MG CR tablet Take 1,300 mg by mouth every 8 (eight) hours as needed for pain.     Marland Kitchen albuterol (PROAIR HFA) 108 (90 Base) MCG/ACT inhaler INHALE 2 PUFFS INTO LUNGS EVERY 6 HOURS AS NEEDED FOR WHEEZING OR SHORTNESS OF BREATH 8.5 Inhaler 1  . albuterol (PROVENTIL) (2.5 MG/3ML) 0.083% nebulizer solution Take 3 mLs (2.5 mg total) by nebulization every 6 (six) hours as needed for wheezing or shortness of breath. 150 mL 1  . aspirin EC 81 MG tablet Take 81 mg by mouth at bedtime. Melanoma prevention    . cetirizine (ZYRTEC) 10 MG tablet Take 10 mg by mouth daily.    . furosemide (LASIX) 40 MG tablet TAKE 1 TABLET BY MOUTH EVERY DAY 30 tablet 3  . gabapentin (NEURONTIN) 100 MG capsule TAKE 1 CAPSULE (100 MG TOTAL) BY MOUTH AT BEDTIME. 30 capsule 3  . losartan (COZAAR) 50 MG tablet TAKE 1 TABLET EVERY DAY 30 tablet 2  . montelukast (SINGULAIR) 10 MG tablet TAKE 1 TABLET BY MOUTH DAILY (Patient taking differently: TAKE 1 TABLET BY MOUTH DAILY AT NIGHT) 30 tablet 2  . Multiple Vitamin (MULTIVITAMIN WITH MINERALS) TABS tablet Take 1 tablet by mouth daily.    .  Polyvinyl Alcohol-Povidone (TEARS PLUS OP) Place 1 drop into both eyes  daily as needed (dry eyes/ redness/ burning).    . potassium chloride SA (K-DUR,KLOR-CON) 20 MEQ tablet Take 1 tablet (20 mEq total) by mouth 2 (two) times daily. 60 tablet 6  . PRESCRIPTION MEDICATION CPAP    . Probiotic Product (PROBIOTIC DAILY PO) Take 1 tablet by mouth daily.     . Simethicone (GAS-X PO) Take 1 tablet by mouth daily as needed (gas).    Marland Kitchen spironolactone (ALDACTONE) 25 MG tablet TAKE 1/2 TABLET BY MOUTH DAILY. 15 tablet 2  . triamcinolone ointment (KENALOG) 0.5 % Apply 1 application topically 2 (two) times daily. 30 g 1  . Vitamin D, Ergocalciferol, (DRISDOL) 50000 units CAPS capsule TAKE 1 CAPSULE (50,000 UNITS TOTAL) BY MOUTH EVERY 7 (SEVEN) DAYS. 12 capsule 3  . KLOR-CON M20 20 MEQ tablet TAKE 1 TABLET BY MOUTH THREE TIMES A DAY (Patient taking differently: Take 20 meq by mouth 2 times daily) 90 tablet 2   No facility-administered medications prior to visit.     Review of Systems  Constitutional: Negative for malaise/fatigue and weight loss.  HENT: Positive for congestion. Negative for sinus pain and sore throat.   Respiratory: Positive for wheezing. Negative for cough and sputum production.   Cardiovascular: Negative for claudication, leg swelling and PND.  Skin: Negative for itching and rash.  Neurological: Negative for weakness.      Objective:  Physical Exam   Vitals:   01/16/18 1514  BP: 120/74  Pulse: 63  SpO2: 97%  Weight: 221 lb (100.2 kg)  Height: 5\' 2"  (1.575 m)   Gen: well appearing HENT: OP clear, TM's clear, neck supple PULM: CTA B, normal percussion CV: RRR, no mgr, trace edema GI: BS+, soft, nontender Derm: no cyanosis or rash Psyche: normal mood and affect   CBC    Component Value Date/Time   WBC 5.8 08/15/2017 1039   WBC 8.2 03/08/2017 1517   RBC 4.30 08/15/2017 1039   RBC 4.41 03/08/2017 1517   HGB 13.2 08/15/2017 1039   HGB 12.8 10/21/2012   HCT 40.5  08/15/2017 1039   PLT 196 08/15/2017 1039   MCV 94 08/15/2017 1039   MCH 30.7 08/15/2017 1039   MCH 30.1 07/03/2016 0850   MCHC 32.6 08/15/2017 1039   MCHC 33.6 03/08/2017 1517   RDW 13.5 08/15/2017 1039   LYMPHSABS 2.0 08/15/2017 1039   MONOABS 0.6 03/08/2017 1517   EOSABS 0.2 08/15/2017 1039   BASOSABS 0.0 08/15/2017 1039    Sleep study: PSG 2003 which showed AHI of 81 events per hour and lowest desaturation to 57%. This was corrected by CPAP of 12 cm in with residual events of 5.6 events per hour on a study in 2007. CPAP 11/05/13 to 03/24/14>>used on 140 of 140 nights with average 4 hrs and 36 min. Average AHI is 6.5 with median CPAP 12 cm H2O.   PFT: PFTs 04/2014- no airway obs, ratio ok, FEV1 80%, TLC &DLCO nml   Records from her last visit here reviewed where she was seen for mild intermittent asthma and obstructive sleep apnea.    Assessment & Plan:   OSA (obstructive sleep apnea) - Plan: Ambulatory Referral for DME  Mild persistent asthma with exacerbation  Seasonal allergic rhinitis due to pollen  Gastroesophageal reflux disease, esophagitis presence not specified  Discussion: This has been a stable interval for [facility].  She has had to use albuterol approximately twice and she had some exacerbations back in the spring 2018.  At this time she does not  meet criteria for poorly controlled asthma though I worry about the upcoming pollen season.  I have advised her to call me if she has increasing albuterol use as I think she would need to use something like Flovent or Asmanex to prevent a fall on exacerbation.  She has upper airway wheezing exacerbated by allergic rhinitis and acid reflux.  We talked about this today.  It sounds like she is quite compliant with her CPAP machine, we will reach out to her DME company to get a formal report of compliance.  Mild intermittent asthma: Continue to use albuterol as needed Continue Singulair Please let me know if you are having  to use albuterol more than twice a week and then we can call in something like Flovent or Asmanex to help prevent an exacerbation  Allergic rhinitis: Use Zyrtec over-the-counter Continue Singulair Use Flonase 2 sprays each nostril daily during the pollen season  Gastroesophageal reflux disease: Follow the lifestyle modification sheet we gave you If this becomes more of a problem (wheezing after swallowing or eating) then consider taking an over-the-counter Pepcid  Obstructive sleep apnea: Continue CPAP nightly We will reach out to your CPAP supplier to get a compliance report  I will see you back in 6 months or sooner if needed    Current Outpatient Medications:  .  acetaminophen (TYLENOL ARTHRITIS PAIN) 650 MG CR tablet, Take 1,300 mg by mouth every 8 (eight) hours as needed for pain. , Disp: , Rfl:  .  albuterol (PROAIR HFA) 108 (90 Base) MCG/ACT inhaler, INHALE 2 PUFFS INTO LUNGS EVERY 6 HOURS AS NEEDED FOR WHEEZING OR SHORTNESS OF BREATH, Disp: 8.5 Inhaler, Rfl: 1 .  albuterol (PROVENTIL) (2.5 MG/3ML) 0.083% nebulizer solution, Take 3 mLs (2.5 mg total) by nebulization every 6 (six) hours as needed for wheezing or shortness of breath., Disp: 150 mL, Rfl: 1 .  aspirin EC 81 MG tablet, Take 81 mg by mouth at bedtime. Melanoma prevention, Disp: , Rfl:  .  cetirizine (ZYRTEC) 10 MG tablet, Take 10 mg by mouth daily., Disp: , Rfl:  .  furosemide (LASIX) 40 MG tablet, TAKE 1 TABLET BY MOUTH EVERY DAY, Disp: 30 tablet, Rfl: 3 .  gabapentin (NEURONTIN) 100 MG capsule, TAKE 1 CAPSULE (100 MG TOTAL) BY MOUTH AT BEDTIME., Disp: 30 capsule, Rfl: 3 .  losartan (COZAAR) 50 MG tablet, TAKE 1 TABLET EVERY DAY, Disp: 30 tablet, Rfl: 2 .  montelukast (SINGULAIR) 10 MG tablet, TAKE 1 TABLET BY MOUTH DAILY (Patient taking differently: TAKE 1 TABLET BY MOUTH DAILY AT NIGHT), Disp: 30 tablet, Rfl: 2 .  Multiple Vitamin (MULTIVITAMIN WITH MINERALS) TABS tablet, Take 1 tablet by mouth daily., Disp: , Rfl:    .  Polyvinyl Alcohol-Povidone (TEARS PLUS OP), Place 1 drop into both eyes daily as needed (dry eyes/ redness/ burning)., Disp: , Rfl:  .  potassium chloride SA (K-DUR,KLOR-CON) 20 MEQ tablet, Take 1 tablet (20 mEq total) by mouth 2 (two) times daily., Disp: 60 tablet, Rfl: 6 .  PRESCRIPTION MEDICATION, CPAP, Disp: , Rfl:  .  Probiotic Product (PROBIOTIC DAILY PO), Take 1 tablet by mouth daily. , Disp: , Rfl:  .  Simethicone (GAS-X PO), Take 1 tablet by mouth daily as needed (gas)., Disp: , Rfl:  .  spironolactone (ALDACTONE) 25 MG tablet, TAKE 1/2 TABLET BY MOUTH DAILY., Disp: 15 tablet, Rfl: 2 .  triamcinolone ointment (KENALOG) 0.5 %, Apply 1 application topically 2 (two) times daily., Disp: 30 g, Rfl: 1 .  Vitamin D, Ergocalciferol, (DRISDOL) 50000 units CAPS capsule, TAKE 1 CAPSULE (50,000 UNITS TOTAL) BY MOUTH EVERY 7 (SEVEN) DAYS., Disp: 12 capsule, Rfl: 3

## 2018-01-16 NOTE — Patient Instructions (Signed)
Mild intermittent asthma: Continue to use albuterol as needed Continue Singulair Please let me know if you are having to use albuterol more than twice a week and then we can call in something like Flovent or Asmanex to help prevent an exacerbation  Allergic rhinitis: Use Zyrtec over-the-counter Continue Singulair Use Flonase 2 sprays each nostril daily during the pollen season  Gastroesophageal reflux disease: Follow the lifestyle modification sheet we gave you If this becomes more of a problem (wheezing after swallowing or eating) then consider taking an over-the-counter Pepcid  Obstructive sleep apnea: Continue CPAP nightly We will reach out to your CPAP supplier to get a compliance report  I will see you back in 6 months or sooner if needed

## 2018-01-19 ENCOUNTER — Other Ambulatory Visit: Payer: Self-pay | Admitting: Family Medicine

## 2018-01-20 NOTE — Telephone Encounter (Signed)
Last filled:  12/17/17, #30 Last OV:  01/13/18 Next OV (CPE):  10/08/18

## 2018-01-21 ENCOUNTER — Encounter: Payer: Self-pay | Admitting: Radiology

## 2018-02-05 ENCOUNTER — Ambulatory Visit (INDEPENDENT_AMBULATORY_CARE_PROVIDER_SITE_OTHER): Payer: Medicare Other | Admitting: Family Medicine

## 2018-02-05 ENCOUNTER — Encounter: Payer: Self-pay | Admitting: Family Medicine

## 2018-02-05 VITALS — BP 120/78 | HR 60 | Temp 98.2°F | Wt 221.0 lb

## 2018-02-05 DIAGNOSIS — L97929 Non-pressure chronic ulcer of unspecified part of left lower leg with unspecified severity: Secondary | ICD-10-CM | POA: Diagnosis not present

## 2018-02-05 DIAGNOSIS — R600 Localized edema: Secondary | ICD-10-CM

## 2018-02-05 DIAGNOSIS — R609 Edema, unspecified: Secondary | ICD-10-CM

## 2018-02-05 DIAGNOSIS — I83893 Varicose veins of bilateral lower extremities with other complications: Secondary | ICD-10-CM | POA: Diagnosis not present

## 2018-02-05 DIAGNOSIS — I83029 Varicose veins of left lower extremity with ulcer of unspecified site: Secondary | ICD-10-CM | POA: Insufficient documentation

## 2018-02-05 DIAGNOSIS — I83892 Varicose veins of left lower extremities with other complications: Secondary | ICD-10-CM

## 2018-02-05 NOTE — Progress Notes (Signed)
BP 120/78 (BP Location: Left Arm, Patient Position: Sitting, Cuff Size: Large)   Pulse 60   Temp 98.2 F (36.8 C) (Oral)   Wt 221 lb (100.2 kg)   SpO2 97%   BMI 40.42 kg/m    CC: check leg sore Subjective:    Patient ID: [name], female    DOB: 07-04-1946, 72 y.o.   MRN: [id number]  HPI: [name] is a 72 y.o. female presenting on 02/05/2018 for Skin lesion (Located on lower left LE on medial side. Noticed about 2 wks ago. Says area has "oozed" at times. Says it does not look like any of the other sores she usually gets. )   2 wks ago noticed "a sore or two" on lateral left leg. Last week area was tender to touch. Has been treating with vaseline, bandage, and knee socks. This helps but doesn't fully heal.   ABIs normal 02/2015.   Relevant past medical, surgical, family and social history reviewed and updated as indicated. Interim medical history since our last visit reviewed. Allergies and medications reviewed and updated. Outpatient Medications Prior to Visit  Medication Sig Dispense Refill  . acetaminophen (TYLENOL ARTHRITIS PAIN) 650 MG CR tablet Take 1,300 mg by mouth every 8 (eight) hours as needed for pain.     Marland Kitchen albuterol (PROAIR HFA) 108 (90 Base) MCG/ACT inhaler INHALE 2 PUFFS INTO LUNGS EVERY 6 HOURS AS NEEDED FOR WHEEZING OR SHORTNESS OF BREATH 8.5 Inhaler 1  . albuterol (PROVENTIL) (2.5 MG/3ML) 0.083% nebulizer solution Take 3 mLs (2.5 mg total) by nebulization every 6 (six) hours as needed for wheezing or shortness of breath. 150 mL 1  . aspirin EC 81 MG tablet Take 81 mg by mouth at bedtime. Melanoma prevention    . cetirizine (ZYRTEC) 10 MG tablet Take 10 mg by mouth daily.    . famotidine (PEPCID) 10 MG tablet Take 10 mg by mouth daily.    . furosemide (LASIX) 40 MG tablet TAKE 1 TABLET BY MOUTH EVERY DAY 30 tablet 3  . gabapentin (NEURONTIN) 100 MG capsule TAKE 1 CAPSULE (100 MG TOTAL) BY MOUTH AT BEDTIME. 30 capsule 3  . losartan (COZAAR) 50 MG tablet  TAKE 1 TABLET EVERY DAY 30 tablet 2  . montelukast (SINGULAIR) 10 MG tablet TAKE 1 TABLET BY MOUTH DAILY (Patient taking differently: TAKE 1 TABLET BY MOUTH DAILY AT NIGHT) 30 tablet 2  . Multiple Vitamin (MULTIVITAMIN WITH MINERALS) TABS tablet Take 1 tablet by mouth daily.    . Polyvinyl Alcohol-Povidone (TEARS PLUS OP) Place 1 drop into both eyes daily as needed (dry eyes/ redness/ burning).    . potassium chloride SA (K-DUR,KLOR-CON) 20 MEQ tablet Take 1 tablet (20 mEq total) by mouth 2 (two) times daily. 60 tablet 6  . PRESCRIPTION MEDICATION CPAP    . Probiotic Product (PROBIOTIC DAILY PO) Take 1 tablet by mouth daily.     . Simethicone (GAS-X PO) Take 1 tablet by mouth daily as needed (gas).    Marland Kitchen spironolactone (ALDACTONE) 25 MG tablet TAKE 1/2 TABLET BY MOUTH DAILY. 15 tablet 2  . triamcinolone (NASACORT ALLERGY 24HR) 55 MCG/ACT AERO nasal inhaler Place 2 sprays into the nose daily.    Marland Kitchen triamcinolone ointment (KENALOG) 0.5 % Apply 1 application topically 2 (two) times daily. 30 g 1  . Vitamin D, Ergocalciferol, (DRISDOL) 50000 units CAPS capsule TAKE 1 CAPSULE (50,000 UNITS TOTAL) BY MOUTH EVERY 7 (SEVEN) DAYS. 12 capsule 3   No facility-administered medications  prior to visit.      Per HPI unless specifically indicated in ROS section below Review of Systems     Objective:    BP 120/78 (BP Location: Left Arm, Patient Position: Sitting, Cuff Size: Large)   Pulse 60   Temp 98.2 F (36.8 C) (Oral)   Wt 221 lb (100.2 kg)   SpO2 97%   BMI 40.42 kg/m   Wt Readings from Last 3 Encounters:  02/05/18 221 lb (100.2 kg)  01/16/18 221 lb (100.2 kg)  01/13/18 220 lb (99.8 kg)    Physical Exam  Constitutional: She appears well-developed and well-nourished. No distress.  Musculoskeletal: She exhibits edema (tender chronic mild pitting edema).  Skin: Skin is warm and dry. No erythema.  Small subcm venous ulcer L lateral lower leg with granulation tissue, smaller 2nd ulcer 1.5 in above  this No surrounding erythema  Nursing note and vitals reviewed.  Results for orders placed or performed in visit on 11/07/17  Potassium  Result Value Ref Range   Potassium 4.3 3.5 - 5.1 mEq/L      Assessment & Plan:   Problem List Items Addressed This Visit    Pedal edema   Varicose veins of both lower extremities with complications   Venous stasis ulcer of left lower leg with edema of left lower leg ([place]) - Primary    ABIs normal 2016.  Anticipate recurrent venous stasis ulcer in h/o same.  Treat with unna boot to left leg. RTC in 5 days for recheck.           No orders of the defined types were placed in this encounter.  No orders of the defined types were placed in this encounter.   Follow up plan: Return if symptoms worsen or fail to improve.  Amber Bush, MD

## 2018-02-05 NOTE — Assessment & Plan Note (Signed)
ABIs normal 2016.  Anticipate recurrent venous stasis ulcer in h/o same.  Treat with unna boot to left leg. RTC in 5 days for recheck.

## 2018-02-05 NOTE — Patient Instructions (Signed)
You have recurrent venous ulcer on the left - treat with unna boot. Return Monday for a recheck.

## 2018-02-07 DIAGNOSIS — M79604 Pain in right leg: Secondary | ICD-10-CM | POA: Diagnosis not present

## 2018-02-07 DIAGNOSIS — M79605 Pain in left leg: Secondary | ICD-10-CM | POA: Diagnosis not present

## 2018-02-07 DIAGNOSIS — I83893 Varicose veins of bilateral lower extremities with other complications: Secondary | ICD-10-CM | POA: Diagnosis not present

## 2018-02-10 ENCOUNTER — Ambulatory Visit (INDEPENDENT_AMBULATORY_CARE_PROVIDER_SITE_OTHER): Payer: Medicare Other | Admitting: Family Medicine

## 2018-02-10 ENCOUNTER — Encounter: Payer: Self-pay | Admitting: Family Medicine

## 2018-02-10 VITALS — BP 118/68 | HR 60 | Temp 98.2°F | Wt 222.0 lb

## 2018-02-10 DIAGNOSIS — R609 Edema, unspecified: Secondary | ICD-10-CM | POA: Diagnosis not present

## 2018-02-10 DIAGNOSIS — I83892 Varicose veins of left lower extremities with other complications: Secondary | ICD-10-CM | POA: Diagnosis not present

## 2018-02-10 DIAGNOSIS — I83893 Varicose veins of bilateral lower extremities with other complications: Secondary | ICD-10-CM | POA: Diagnosis not present

## 2018-02-10 DIAGNOSIS — I83029 Varicose veins of left lower extremity with ulcer of unspecified site: Secondary | ICD-10-CM

## 2018-02-10 DIAGNOSIS — I872 Venous insufficiency (chronic) (peripheral): Secondary | ICD-10-CM | POA: Diagnosis not present

## 2018-02-10 DIAGNOSIS — L97929 Non-pressure chronic ulcer of unspecified part of left lower leg with unspecified severity: Secondary | ICD-10-CM | POA: Diagnosis not present

## 2018-02-10 NOTE — Patient Instructions (Signed)
Unna boot placed again for left leg venous ulcer. Return on Friday or Monday for recheck.   Venous Ulcer A venous ulcer is a shallow sore on your lower leg that is caused by poor circulation in your veins. This condition used to be called stasis ulcer. Veins have valves that help return blood to the heart. If these valves do not work properly, it can cause blood to flow backward and to back up into the veins near the skin. When that happens, blood can pool in your lower legs. The blood can then leak out of your veins, which can irritate your skin. This may cause a break in your skin that becomes a venous ulcer. Venous ulcer is the most common type of lower leg ulcer. You may have venous ulcers on one leg or on both legs. The area where this condition most commonly develops is around the ankles. A venous ulcer may last for a long time (chronic ulcer) or it may return repeatedly (recurrent ulcer). What are the causes? Any condition that causes poor circulation to your legs can lead to a venous ulcer. What increases the risk? This condition is more likely to develop in:  People who are 72 years of age or older.  People who are overweight.  People who are not active.  People who have had a leg ulcer in the past.  People who have clots in their lower leg veins (deep vein thrombosis).  People who have inflammation of their leg veins (phlebitis).  Women who have given birth.  People who smoke.  What are the signs or symptoms? The most common symptom of this condition is an open sore near your ankle. Other symptoms may include:  Swelling.  Thickening of the skin.  Fluid leaking from the ulcer.  Bleeding.  Itching.  Pain and swelling that gets worse when you stand up and feels better when you raise your leg.  Blotchy skin.  Darkening of the skin.  How is this diagnosed? Your health care provider may suspect a venous ulcer based on your medical history and your risk factors.  Your health care provider will check the skin on your legs. Other tests may be done to learn more about the ulcer and to determine the best way to treat it. Tests that may be done include:  Measuring the blood pressure in your arms and legs.  Using sound waves (ultrasound) to measure the blood flow in your leg veins.  How is this treated? You may need to try several different types of treatment to get your venous ulcer to heal. Healing may take a long time. Treatment may include:  Keeping your leg raised (elevated).  Wearing a type of bandage or stocking to compress the veins of your leg (compression therapy). Venous wounds are not likely to heal or to stay healed without compression.  Taking medicines to improve blood flow.  Taking antibiotic medicines to treat infection.  Cleaning your ulcer and removing any dead tissue from the wound (debridement).  Placing various types of medicated bandage (dressings) or medicated wraps on your ulcer. This helps the ulcer to heal and helps to prevent infection.  Surgery is sometimes needed to close the wound using a piece of skin taken from another area of your body (graft). You may need surgery if other treatments are not working or if your ulcer is very deep. Follow these instructions at home: Wound care  Follow instructions from your health care provider about: ? How to take  care of your wound. ? When and how you should change your bandage (dressing). ? When you should remove your dressing. If your dressing is dry and sticks to your leg when you try to remove it, moisten or wet the dressing with saline solution or water so that the dressing can be removed without harming your skin or wound tissue.  Check your wound every day for signs of infection. Have a caregiver do this for you if you are not able to do it yourself. Check for: ? More redness, swelling, or pain. ? More fluid or blood. ? Pus, warmth, or a bad smell. Medicines  Take  over-the-counter and prescription medicines only as told by your health care provider.  If you were prescribed an antibiotic medicine, take it or apply it as told by your health care provider. Do not stop taking or using the antibiotic even if your condition improves. Activity  Do not stand or sit in one position for a long period of time. Rest with your legs raised during the day. If possible, keep your legs above your heart for 30 minutes, 3-4 times a day, or as told by your health care provider.  Do not sit with your legs crossed.  Walk often to increase the blood flow in your legs.Ask your health care provider what level of activity is safe for you.  If you are taking a long ride in a car or plane, take a break to walk around at least once every two hours, or as often as your health care provider recommends. Ask your health care provider if you should take aspirin before long trips. General instructions   Wear elastic stockings, compression stockings, or support hose as told by your health care provider. This is very important.  Raise the foot of your bed as told by your health care provider.  Do not smoke.  Keep all follow-up visits as told by your health care provider. This is important. Contact a health care provider if:  You have a fever.  Your ulcer is getting larger or is not healing.  Your pain gets worse.  You have more redness or swelling around your ulcer.  You have more fluid, blood, or pus coming from your ulcer after it has been cleaned by you or your health care provider.  You have warmth or a bad smell coming from your ulcer. This information is not intended to replace advice given to you by your health care provider. Make sure you discuss any questions you have with your health care provider. Document Released: 07/17/2001 Document Revised: 03/29/2016 Document Reviewed: 03/02/2015 Elsevier Interactive Patient Education  [employer].

## 2018-02-10 NOTE — Progress Notes (Signed)
BP 118/68 (BP Location: Left Arm, Patient Position: Sitting, Cuff Size: Large)   Pulse 60   Temp 98.2 F (36.8 C) (Oral)   Wt 222 lb (100.7 kg)   SpO2 97%   BMI 40.60 kg/m    CC: f/u venous ulcer LLE Subjective:    Patient ID: [name], female    DOB: Nov 07, 1945, 72 y.o.   MRN: [id number]  HPI: [name] is a 72 y.o. female presenting on 02/10/2018 for Wound Check   See prior note for details. Seen here 02/05/2018 with 2 LLE lateral venous ulcers, treated with unna boot. Here for f/u. Last visit ulcers measured <1cm.   ABIs normal 02/2015.  Relevant past medical, surgical, family and social history reviewed and updated as indicated. Interim medical history since our last visit reviewed. Allergies and medications reviewed and updated. Outpatient Medications Prior to Visit  Medication Sig Dispense Refill  . acetaminophen (TYLENOL ARTHRITIS PAIN) 650 MG CR tablet Take 1,300 mg by mouth every 8 (eight) hours as needed for pain.     Marland Kitchen albuterol (PROAIR HFA) 108 (90 Base) MCG/ACT inhaler INHALE 2 PUFFS INTO LUNGS EVERY 6 HOURS AS NEEDED FOR WHEEZING OR SHORTNESS OF BREATH 8.5 Inhaler 1  . albuterol (PROVENTIL) (2.5 MG/3ML) 0.083% nebulizer solution Take 3 mLs (2.5 mg total) by nebulization every 6 (six) hours as needed for wheezing or shortness of breath. 150 mL 1  . aspirin EC 81 MG tablet Take 81 mg by mouth at bedtime. Melanoma prevention    . cetirizine (ZYRTEC) 10 MG tablet Take 10 mg by mouth daily.    . famotidine (PEPCID) 10 MG tablet Take 10 mg by mouth daily.    . furosemide (LASIX) 40 MG tablet TAKE 1 TABLET BY MOUTH EVERY DAY 30 tablet 3  . gabapentin (NEURONTIN) 100 MG capsule TAKE 1 CAPSULE (100 MG TOTAL) BY MOUTH AT BEDTIME. 30 capsule 3  . losartan (COZAAR) 50 MG tablet TAKE 1 TABLET EVERY DAY 30 tablet 2  . montelukast (SINGULAIR) 10 MG tablet TAKE 1 TABLET BY MOUTH DAILY (Patient taking differently: TAKE 1 TABLET BY MOUTH DAILY AT NIGHT) 30 tablet 2  . Multiple  Vitamin (MULTIVITAMIN WITH MINERALS) TABS tablet Take 1 tablet by mouth daily.    . Polyvinyl Alcohol-Povidone (TEARS PLUS OP) Place 1 drop into both eyes daily as needed (dry eyes/ redness/ burning).    . potassium chloride SA (K-DUR,KLOR-CON) 20 MEQ tablet Take 1 tablet (20 mEq total) by mouth 2 (two) times daily. 60 tablet 6  . PRESCRIPTION MEDICATION CPAP    . Probiotic Product (PROBIOTIC DAILY PO) Take 1 tablet by mouth daily.     . Simethicone (GAS-X PO) Take 1 tablet by mouth daily as needed (gas).    Marland Kitchen spironolactone (ALDACTONE) 25 MG tablet TAKE 1/2 TABLET BY MOUTH DAILY. 15 tablet 2  . triamcinolone (NASACORT ALLERGY 24HR) 55 MCG/ACT AERO nasal inhaler Place 2 sprays into the nose daily. In each nostril    . triamcinolone ointment (KENALOG) 0.5 % Apply 1 application topically 2 (two) times daily. 30 g 1  . Vitamin D, Ergocalciferol, (DRISDOL) 50000 units CAPS capsule TAKE 1 CAPSULE (50,000 UNITS TOTAL) BY MOUTH EVERY 7 (SEVEN) DAYS. 12 capsule 3   No facility-administered medications prior to visit.      Per HPI unless specifically indicated in ROS section below Review of Systems     Objective:    BP 118/68 (BP Location: Left Arm, Patient Position: Sitting, Cuff Size:  Large)   Pulse 60   Temp 98.2 F (36.8 C) (Oral)   Wt 222 lb (100.7 kg)   SpO2 97%   BMI 40.60 kg/m   Wt Readings from Last 3 Encounters:  02/10/18 222 lb (100.7 kg)  02/05/18 221 lb (100.2 kg)  01/16/18 221 lb (100.2 kg)    Physical Exam  Constitutional: She appears well-developed and well-nourished. No distress.  Musculoskeletal: She exhibits edema (chronic pitting).  Left lateral ankle with 1cm shallow ulcer with healthy granulation tissue Second scab superior to this healing well, largely resolved  Skin: Skin is warm and dry. Lesion noted. No rash noted. No erythema.  Nursing note and vitals reviewed.  Results for orders placed or performed in visit on 11/07/17  Potassium  Result Value Ref Range     Potassium 4.3 3.5 - 5.1 mEq/L      Assessment & Plan:   Problem List Items Addressed This Visit    Chronic venous insufficiency   Varicose veins of both lower extremities with complications   Venous stasis ulcer of left lower leg with edema of left lower leg (HCC) - Primary    Persistent. Rpt Unna boot therapy x 1 wk. If persistent ulcer, will refer to wound clinic. Pt agrees with plan.           No orders of the defined types were placed in this encounter.  No orders of the defined types were placed in this encounter.   Follow up plan: Return if symptoms worsen or fail to improve.  [name], MD

## 2018-02-10 NOTE — Assessment & Plan Note (Signed)
Persistent. Rpt Unna boot therapy x 1 wk. If persistent ulcer, will refer to wound clinic. Pt agrees with plan.

## 2018-02-13 ENCOUNTER — Inpatient Hospital Stay: Payer: Medicare Other | Attending: Family | Admitting: Family

## 2018-02-13 ENCOUNTER — Encounter: Payer: Self-pay | Admitting: Family

## 2018-02-13 ENCOUNTER — Other Ambulatory Visit: Payer: Self-pay

## 2018-02-13 ENCOUNTER — Inpatient Hospital Stay: Payer: Medicare Other

## 2018-02-13 VITALS — BP 126/71 | HR 54 | Temp 97.9°F | Resp 18 | Wt 219.0 lb

## 2018-02-13 DIAGNOSIS — C439 Malignant melanoma of skin, unspecified: Secondary | ICD-10-CM

## 2018-02-13 DIAGNOSIS — I872 Venous insufficiency (chronic) (peripheral): Secondary | ICD-10-CM | POA: Insufficient documentation

## 2018-02-13 DIAGNOSIS — Z79899 Other long term (current) drug therapy: Secondary | ICD-10-CM | POA: Diagnosis not present

## 2018-02-13 DIAGNOSIS — M7989 Other specified soft tissue disorders: Secondary | ICD-10-CM | POA: Diagnosis not present

## 2018-02-13 DIAGNOSIS — C4361 Malignant melanoma of right upper limb, including shoulder: Secondary | ICD-10-CM | POA: Diagnosis not present

## 2018-02-13 DIAGNOSIS — L97929 Non-pressure chronic ulcer of unspecified part of left lower leg with unspecified severity: Secondary | ICD-10-CM | POA: Insufficient documentation

## 2018-02-13 DIAGNOSIS — G5603 Carpal tunnel syndrome, bilateral upper limbs: Secondary | ICD-10-CM | POA: Insufficient documentation

## 2018-02-13 LAB — CMP (CANCER CENTER ONLY)
ALT: 22 U/L (ref 0–55)
AST: 14 U/L (ref 5–34)
Albumin: 4.1 g/dL (ref 3.5–5.0)
Alkaline Phosphatase: 57 U/L (ref 40–150)
Anion gap: 11 (ref 3–11)
BUN: 17 mg/dL (ref 7–26)
CO2: 26 mmol/L (ref 22–29)
Calcium: 10.1 mg/dL (ref 8.4–10.4)
Chloride: 108 mmol/L (ref 98–109)
Creatinine: 0.81 mg/dL (ref 0.60–1.10)
GFR, Est AFR Am: >60 mL/min (ref >60)
GFR, Estimated: >60 mL/min (ref >60)
Glucose, Bld: 90 mg/dL (ref 70–140)
Potassium: 4.5 mmol/L (ref 3.5–5.1)
Sodium: 145 mmol/L (ref 136–145)
Total Bilirubin: 0.6 mg/dL (ref 0.2–1.2)
Total Protein: 7.7 g/dL (ref 6.4–8.3)

## 2018-02-13 LAB — CBC WITH DIFFERENTIAL (CANCER CENTER ONLY)
Basophils Absolute: 0 10*3/uL (ref 0.0–0.1)
Basophils Relative: 0 %
Eosinophils Absolute: 0.2 10*3/uL (ref 0.0–0.5)
Eosinophils Relative: 3 %
HCT: 42.2 % (ref 34.8–46.6)
Hemoglobin: 13.8 g/dL (ref 11.6–15.9)
Lymphocytes Relative: 25 %
Lymphs Abs: 1.7 10*3/uL (ref 0.9–3.3)
MCH: 30.9 pg (ref 26.0–34.0)
MCHC: 32.7 g/dL (ref 32.0–36.0)
MCV: 94.4 fL (ref 81.0–101.0)
Monocytes Absolute: 0.5 10*3/uL (ref 0.1–0.9)
Monocytes Relative: 7 %
Neutro Abs: 4.5 10*3/uL (ref 1.5–6.5)
Neutrophils Relative %: 65 %
Platelet Count: 181 10*3/uL (ref 145–400)
RBC: 4.47 MIL/uL (ref 3.70–5.32)
RDW: 13 % (ref 11.1–15.7)
WBC Count: 6.9 10*3/uL (ref 3.9–10.0)

## 2018-02-13 LAB — LACTATE DEHYDROGENASE: LDH: 203 U/L (ref 125–245)

## 2018-02-13 NOTE — Progress Notes (Signed)
Hematology and Oncology Follow Up Visit  [name] [id number] 10/08/1946 72 y.o. 02/13/2018   Principle Diagnosis:  Stage IIIC (T3b N3 M0) melanoma of the right shoulder  Current Therapy:   Observation   Interim History:  [name] is here today for follow-up. She has history of venous insufficiency and has an ulcer on her left foot which is being closely monitored by her PCP. Her leg and foot are wrapped with a bandage. She goes back to see her PCP tomorrow to determine whether or not wound care management needs to be consulted.  She has no new skin changes. Her right shoulder incision is intact. She continues to follow-up with her dermatologist once a year.  She has had no fever, chills, n/v, cough, rash, dizziness, SOB, chest pain, palpitations, abdominal pain or changes in bowel or bladder habits.  She has chronic swelling in her lower extremities due to the venous insufficiency. This waxes and wanes.  She has positional numbness and tingling in her hands due to carpal tunnel syndrome. She wears braces at night.  No lymphadenopathy found on exam.  No episodes of bleeding, no bruising or petechiae.  She has maintained a good appetite and is staying well hydrated. Her weight is stable.    ECOG Performance Status: 1 - Symptomatic but completely ambulatory  Medications:  Allergies as of 02/13/2018      Reactions   Sulfa Antibiotics Itching, Swelling, Shortness Of Breath   Facial swelling   Voltaren [diclofenac Sodium] Shortness Of Breath   Latex Other (See Comments)   Blisters ONLY HAD REACTION TO TAPE  (??? ONLY ADHESIVE ALLERGY)   Tape Other (See Comments)   Caused blisters - must use paper tape - same reaction from NeoPrene   Other    Neoprene      Medication List        Accurate as of 02/13/18 11:21 AM. Always use your most recent med list.          albuterol (2.5 MG/3ML) 0.083% nebulizer solution Commonly known as:  PROVENTIL Take 3 mLs (2.5 mg total) by  nebulization every 6 (six) hours as needed for wheezing or shortness of breath.   albuterol 108 (90 Base) MCG/ACT inhaler Commonly known as:  PROAIR HFA INHALE 2 PUFFS INTO LUNGS EVERY 6 HOURS AS NEEDED FOR WHEEZING OR SHORTNESS OF BREATH   aspirin EC 81 MG tablet Take 81 mg by mouth at bedtime. Melanoma prevention   cetirizine 10 MG tablet Commonly known as:  ZYRTEC Take 10 mg by mouth daily.   famotidine 10 MG tablet Commonly known as:  PEPCID Take 10 mg by mouth daily.   furosemide 40 MG tablet Commonly known as:  LASIX TAKE 1 TABLET BY MOUTH EVERY DAY   gabapentin 100 MG capsule Commonly known as:  NEURONTIN TAKE 1 CAPSULE (100 MG TOTAL) BY MOUTH AT BEDTIME.   GAS-X PO Take 1 tablet by mouth daily as needed (gas).   losartan 50 MG tablet Commonly known as:  COZAAR TAKE 1 TABLET EVERY DAY   montelukast 10 MG tablet Commonly known as:  SINGULAIR TAKE 1 TABLET BY MOUTH DAILY   multivitamin with minerals Tabs tablet Take 1 tablet by mouth daily.   NASACORT ALLERGY 24HR 55 MCG/ACT Aero nasal inhaler Generic drug:  triamcinolone Place 2 sprays into the nose daily. In each nostril   potassium chloride SA 20 MEQ tablet Commonly known as:  K-DUR,KLOR-CON Take 1 tablet (20 mEq total) by mouth 2 (two)  times daily.   PRESCRIPTION MEDICATION CPAP   PROBIOTIC DAILY PO Take 1 tablet by mouth daily.   spironolactone 25 MG tablet Commonly known as:  ALDACTONE TAKE 1/2 TABLET BY MOUTH DAILY.   TEARS PLUS OP Place 1 drop into both eyes daily as needed (dry eyes/ redness/ burning).   triamcinolone ointment 0.5 % Commonly known as:  KENALOG Apply 1 application topically 2 (two) times daily.   TYLENOL ARTHRITIS PAIN 650 MG CR tablet Generic drug:  acetaminophen Take 1,300 mg by mouth every 8 (eight) hours as needed for pain.   Vitamin D (Ergocalciferol) 50000 units Caps capsule Commonly known as:  DRISDOL TAKE 1 CAPSULE (50,000 UNITS TOTAL) BY MOUTH EVERY 7 (SEVEN)  DAYS.       Allergies:  Allergies  Allergen Reactions  . Sulfa Antibiotics Itching, Swelling and Shortness Of Breath    Facial swelling  . Voltaren [Diclofenac Sodium] Shortness Of Breath  . Latex Other (See Comments)    Blisters ONLY HAD REACTION TO TAPE  (??? ONLY ADHESIVE ALLERGY)  . Tape Other (See Comments)    Caused blisters - must use paper tape - same reaction from NeoPrene  . Other     Neoprene    Past Medical History, Surgical history, Social history, and Family History were reviewed and updated.  Review of Systems: All other 10 point review of systems is negative.   Physical Exam:  weight is 219 lb (99.3 kg). Her oral temperature is 97.9 F (36.6 C). Her blood pressure is 126/71 and her pulse is 54 (abnormal). Her respiration is 18 and oxygen saturation is 99%.   Wt Readings from Last 3 Encounters:  02/13/18 219 lb (99.3 kg)  02/10/18 222 lb (100.7 kg)  02/05/18 221 lb (100.2 kg)    Ocular: Sclerae unicteric, pupils equal, round and reactive to light Ear-nose-throat: Oropharynx clear, dentition fair Lymphatic: No cervical, supraclavicular or axillary  adenopathy Lungs no rales or rhonchi, good excursion bilaterally Heart regular rate and rhythm, no murmur appreciated Abd soft, nontender, positive bowel sounds, no liver or spleen tip palpated on exam, no fluid wave  MSK no focal spinal tenderness, no joint edema Neuro: non-focal, well-oriented, appropriate affect Breasts: Deferred   Lab Results  Component Value Date   WBC 6.9 02/13/2018   HGB 13.2 08/15/2017   HCT 42.2 02/13/2018   MCV 94.4 02/13/2018   PLT 181 02/13/2018   Lab Results  Component Value Date   FERRITIN 105 08/21/2013   IRON 73 08/21/2013   TIBC 280 08/21/2013   UIBC 207 08/21/2013   IRONPCTSAT 26 08/21/2013   Lab Results  Component Value Date   RBC 4.47 02/13/2018   No results found for: KPAFRELGTCHN, LAMBDASER, KAPLAMBRATIO No results found for: IGGSERUM, IGA, IGMSERUM No  results found for: Odetta Pink, SPEI   Chemistry      Component Value Date/Time   NA 140 09/25/2017 1126   NA 149 (H) 08/15/2017 1039   NA 143 09/17/2016 1018   K 4.3 11/07/2017 1028   K 4.1 08/15/2017 1039   K 3.5 09/17/2016 1018   CL 104 09/25/2017 1126   CL 107 08/15/2017 1039   CO2 29 09/25/2017 1126   CO2 29 08/15/2017 1039   CO2 28 09/17/2016 1018   BUN 17 09/25/2017 1126   BUN 14 08/15/2017 1039   BUN 19.5 09/17/2016 1018   CREATININE 0.67 09/25/2017 1126   CREATININE 1.0 08/15/2017 1039   CREATININE 0.8  09/17/2016 1018      Component Value Date/Time   CALCIUM 9.6 09/25/2017 1126   CALCIUM 9.7 08/15/2017 1039   CALCIUM 9.8 09/17/2016 1018   ALKPHOS 48 08/15/2017 1039   ALKPHOS 71 09/17/2016 1018   AST 23 08/15/2017 1039   AST 10 09/17/2016 1018   ALT 43 08/15/2017 1039   ALT 18 09/17/2016 1018   BILITOT 0.60 08/15/2017 1039   BILITOT 0.39 09/17/2016 1018      Impression and Plan: [name] is a very pleasant 72 yo caucasian female with history of stage IIIc melanoma if the right shoulder with resection. She had 8 positive lymph nodes and completed adjuvant interferon therapy in [place] 2012 followed by radiation to the right axilla.  She continues to do well and so far there has been no evidence of recurrence.  We will continue to follow along with her and plan to see her back in another 6 months for follow-up.  She will contact our office with any questions or concerns. We can certainly see her sooner if need be.   Laverna Peace, NP 4/11/201911:21 AM

## 2018-02-14 ENCOUNTER — Encounter: Payer: Self-pay | Admitting: Family Medicine

## 2018-02-14 ENCOUNTER — Ambulatory Visit (INDEPENDENT_AMBULATORY_CARE_PROVIDER_SITE_OTHER): Payer: Medicare Other | Admitting: Family Medicine

## 2018-02-14 VITALS — BP 120/70 | HR 56 | Temp 98.2°F | Ht 62.0 in | Wt 223.0 lb

## 2018-02-14 DIAGNOSIS — R609 Edema, unspecified: Secondary | ICD-10-CM | POA: Diagnosis not present

## 2018-02-14 DIAGNOSIS — I872 Venous insufficiency (chronic) (peripheral): Secondary | ICD-10-CM | POA: Diagnosis not present

## 2018-02-14 DIAGNOSIS — M79605 Pain in left leg: Secondary | ICD-10-CM | POA: Diagnosis not present

## 2018-02-14 DIAGNOSIS — I83029 Varicose veins of left lower extremity with ulcer of unspecified site: Secondary | ICD-10-CM | POA: Diagnosis not present

## 2018-02-14 DIAGNOSIS — L97929 Non-pressure chronic ulcer of unspecified part of left lower leg with unspecified severity: Secondary | ICD-10-CM

## 2018-02-14 DIAGNOSIS — I83892 Varicose veins of left lower extremities with other complications: Secondary | ICD-10-CM | POA: Diagnosis not present

## 2018-02-14 DIAGNOSIS — I83893 Varicose veins of bilateral lower extremities with other complications: Secondary | ICD-10-CM

## 2018-02-14 DIAGNOSIS — M79604 Pain in right leg: Secondary | ICD-10-CM | POA: Diagnosis not present

## 2018-02-14 NOTE — Patient Instructions (Signed)
Bandaid for now. See referral coordinator for wound clinic referral. Good to see you today!

## 2018-02-14 NOTE — Assessment & Plan Note (Signed)
No change with unna boot - will refer to wound care. Pt has seen previously for similar issue. pt agrees with plan.

## 2018-02-14 NOTE — Progress Notes (Signed)
BP 120/70 (BP Location: Left Arm, Patient Position: Sitting, Cuff Size: Large)   Pulse (!) 56   Temp 98.2 F (36.8 C) (Oral)   Ht 5\' 2"  (1.575 m)   Wt 223 lb (101.2 kg)   SpO2 97%   BMI 40.79 kg/m    CC: f/u wound Subjective:    Patient ID: [name], female    DOB: 1946-09-07, 72 y.o.   MRN: [id number]  HPI: [name] is a 72 y.o. female presenting on 02/14/2018 for Wound Check   See prior note for details. Seen 02/05/2018 with 2 LLE lateral venous ulcers treated with unna boot. After first unna boot, top ulcer largely healed. Lower ulcer 1cm in diameter.  ABIs normal 02/2015.   Relevant past medical, surgical, family and social history reviewed and updated as indicated. Interim medical history since our last visit reviewed. Allergies and medications reviewed and updated. Outpatient Medications Prior to Visit  Medication Sig Dispense Refill  . acetaminophen (TYLENOL ARTHRITIS PAIN) 650 MG CR tablet Take 1,300 mg by mouth every 8 (eight) hours as needed for pain.     Marland Kitchen albuterol (PROAIR HFA) 108 (90 Base) MCG/ACT inhaler INHALE 2 PUFFS INTO LUNGS EVERY 6 HOURS AS NEEDED FOR WHEEZING OR SHORTNESS OF BREATH 8.5 Inhaler 1  . albuterol (PROVENTIL) (2.5 MG/3ML) 0.083% nebulizer solution Take 3 mLs (2.5 mg total) by nebulization every 6 (six) hours as needed for wheezing or shortness of breath. 150 mL 1  . aspirin EC 81 MG tablet Take 81 mg by mouth at bedtime. Melanoma prevention    . cetirizine (ZYRTEC) 10 MG tablet Take 10 mg by mouth daily.    . famotidine (PEPCID) 10 MG tablet Take 10 mg by mouth daily.    . furosemide (LASIX) 40 MG tablet TAKE 1 TABLET BY MOUTH EVERY DAY 30 tablet 3  . gabapentin (NEURONTIN) 100 MG capsule TAKE 1 CAPSULE (100 MG TOTAL) BY MOUTH AT BEDTIME. 30 capsule 3  . losartan (COZAAR) 50 MG tablet TAKE 1 TABLET EVERY DAY 30 tablet 2  . montelukast (SINGULAIR) 10 MG tablet TAKE 1 TABLET BY MOUTH DAILY (Patient taking differently: TAKE 1 TABLET BY MOUTH  DAILY AT NIGHT) 30 tablet 2  . Multiple Vitamin (MULTIVITAMIN WITH MINERALS) TABS tablet Take 1 tablet by mouth daily.    . Polyvinyl Alcohol-Povidone (TEARS PLUS OP) Place 1 drop into both eyes daily as needed (dry eyes/ redness/ burning).    . potassium chloride SA (K-DUR,KLOR-CON) 20 MEQ tablet Take 1 tablet (20 mEq total) by mouth 2 (two) times daily. 60 tablet 6  . PRESCRIPTION MEDICATION CPAP    . Probiotic Product (PROBIOTIC DAILY PO) Take 1 tablet by mouth daily.     . Simethicone (GAS-X PO) Take 1 tablet by mouth daily as needed (gas).    Marland Kitchen spironolactone (ALDACTONE) 25 MG tablet TAKE 1/2 TABLET BY MOUTH DAILY. 15 tablet 2  . triamcinolone (NASACORT ALLERGY 24HR) 55 MCG/ACT AERO nasal inhaler Place 2 sprays into the nose daily. In each nostril    . triamcinolone ointment (KENALOG) 0.5 % Apply 1 application topically 2 (two) times daily. 30 g 1  . Vitamin D, Ergocalciferol, (DRISDOL) 50000 units CAPS capsule TAKE 1 CAPSULE (50,000 UNITS TOTAL) BY MOUTH EVERY 7 (SEVEN) DAYS. 12 capsule 3   No facility-administered medications prior to visit.      Per HPI unless specifically indicated in ROS section below Review of Systems     Objective:    BP  120/70 (BP Location: Left Arm, Patient Position: Sitting, Cuff Size: Large)   Pulse (!) 56   Temp 98.2 F (36.8 C) (Oral)   Ht 5\' 2"  (1.575 m)   Wt 223 lb (101.2 kg)   SpO2 97%   BMI 40.79 kg/m   Wt Readings from Last 3 Encounters:  02/14/18 223 lb (101.2 kg)  02/13/18 219 lb (99.3 kg)  02/10/18 222 lb (100.7 kg)    Physical Exam  Constitutional: She appears well-developed and well-nourished. No distress.  Musculoskeletal: She exhibits edema (mild chronic pitting).  Left lateral ankle with 1cm shallow ulcer with healthy granulation tissue Smaller second ulcer superior to this fully healed  Skin: Skin is warm and dry. Lesion noted. No rash noted. No erythema.  Nursing note and vitals reviewed.  Results for orders placed or  performed in visit on 02/13/18  Lactate dehydrogenase  Result Value Ref Range   LDH 203 125 - 245 U/L  CMP STAT ([facility] only)  Result Value Ref Range   Sodium 145 136 - 145 mmol/L   Potassium 4.5 3.5 - 5.1 mmol/L   Chloride 108 98 - 109 mmol/L   CO2 26 22 - 29 mmol/L   Glucose, Bld 90 70 - 140 mg/dL   BUN 17 7 - 26 mg/dL   Creatinine 0.81 0.60 - 1.10 mg/dL   Calcium 10.1 8.4 - 10.4 mg/dL   Total Protein 7.7 6.4 - 8.3 g/dL   Albumin 4.1 3.5 - 5.0 g/dL   AST 14 5 - 34 U/L   ALT 22 0 - 55 U/L   Alkaline Phosphatase 57 40 - 150 U/L   Total Bilirubin 0.6 0.2 - 1.2 mg/dL   GFR, Est Non Af Am >60 >60 mL/min   GFR, Est AFR Am >60 >60 mL/min   Anion gap 11 3 - 11  CBC with Differential ([facility])  Result Value Ref Range   WBC Count 6.9 3.9 - 10.0 K/uL   RBC 4.47 3.70 - 5.32 MIL/uL   Hemoglobin 13.8 11.6 - 15.9 g/dL   HCT 42.2 34.8 - 46.6 %   MCV 94.4 81.0 - 101.0 fL   MCH 30.9 26.0 - 34.0 pg   MCHC 32.7 32.0 - 36.0 g/dL   RDW 13.0 11.1 - 15.7 %   Platelet Count 181 145 - 400 K/uL   Neutrophils Relative % 65 %   Neutro Abs 4.5 1.5 - 6.5 K/uL   Lymphocytes Relative 25 %   Lymphs Abs 1.7 0.9 - 3.3 K/uL   Monocytes Relative 7 %   Monocytes Absolute 0.5 0.1 - 0.9 K/uL   Eosinophils Relative 3 %   Eosinophils Absolute 0.2 0.0 - 0.5 K/uL   Basophils Relative 0 %   Basophils Absolute 0.0 0.0 - 0.1 K/uL      Assessment & Plan:   Problem List Items Addressed This Visit    Chronic venous insufficiency   Relevant Orders   Ambulatory referral to Wound Clinic   Varicose veins of both lower extremities with complications   Relevant Orders   Ambulatory referral to Wound Clinic   Venous stasis ulcer of left lower leg with edema of left lower leg (HCC) - Primary    No change with unna boot - will refer to wound care. Pt has seen previously for similar issue. pt agrees with plan.       Relevant Orders   Ambulatory referral to Wound Clinic       No orders  of  the defined types were placed in this encounter.  Orders Placed This Encounter  Procedures  . Ambulatory referral to Wound Clinic    Referral Priority:   Routine    Referral Type:   Consultation    Referral Reason:   Specialty Services Required    Requested Specialty:   Wound Care    Number of Visits Requested:   1    Follow up plan: Return if symptoms worsen or fail to improve.  [name], MD

## 2018-02-19 ENCOUNTER — Encounter: Payer: Medicare Other | Attending: Internal Medicine | Admitting: Internal Medicine

## 2018-02-19 DIAGNOSIS — Z86718 Personal history of other venous thrombosis and embolism: Secondary | ICD-10-CM | POA: Insufficient documentation

## 2018-02-19 DIAGNOSIS — I87332 Chronic venous hypertension (idiopathic) with ulcer and inflammation of left lower extremity: Secondary | ICD-10-CM | POA: Diagnosis not present

## 2018-02-19 DIAGNOSIS — I87312 Chronic venous hypertension (idiopathic) with ulcer of left lower extremity: Secondary | ICD-10-CM | POA: Diagnosis not present

## 2018-02-19 DIAGNOSIS — L97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d: Secondary | ICD-10-CM | POA: Insufficient documentation

## 2018-02-19 DIAGNOSIS — I10 Essential (primary) hypertension: Secondary | ICD-10-CM | POA: Diagnosis not present

## 2018-02-19 DIAGNOSIS — Z9221 Personal history of antineoplastic chemotherapy: Secondary | ICD-10-CM | POA: Insufficient documentation

## 2018-02-19 DIAGNOSIS — G4730 Sleep apnea, unspecified: Secondary | ICD-10-CM | POA: Insufficient documentation

## 2018-02-19 DIAGNOSIS — I890 Lymphedema, not elsewhere classified: Secondary | ICD-10-CM | POA: Diagnosis not present

## 2018-02-19 DIAGNOSIS — L97222 Non-pressure chronic ulcer of left calf with fat layer exposed: Secondary | ICD-10-CM | POA: Diagnosis not present

## 2018-02-19 DIAGNOSIS — Z923 Personal history of irradiation: Secondary | ICD-10-CM | POA: Diagnosis not present

## 2018-02-23 ENCOUNTER — Other Ambulatory Visit: Payer: Self-pay | Admitting: Family Medicine

## 2018-02-25 NOTE — Progress Notes (Signed)
VADIS, SLABACH ([id number]) Visit Report for 02/19/2018 Debridement Details Patient Name: [name]. Date of Service: 02/19/2018 9:45 AM Medical Record Number: [id number] Patient Account Number: [id number] Date of Birth/Sex: Nov 15, 1945 (72 y.o. F) Treating RN: Cornell Barman Primary Care Provider: [name] Other Clinician: Referring Provider: [name] Treating Provider/Extender: Tito Dine in Treatment: 0 Debridement Performed for Wound #4 Left,Lateral Lower Leg Assessment: Performed By: Physician [name] Dillon, MD Debridement Type: Debridement Severity of Tissue Pre Fat layer exposed Debridement: Pre-procedure Verification/Time Yes - 10:12 Out Taken: Start Time: 10:12 Pain Control: Other : lidocaine 4% Total Area Debrided (L x W): 0.8 (cm) x 0.9 (cm) = 0.72 (cm) Tissue and other material Viable, Slough, Subcutaneous, Slough debrided: Level: Skin/Subcutaneous Tissue Debridement Description: Excisional Instrument: Curette Bleeding: Minimum Hemostasis Achieved: Pressure End Time: 10:13 Procedural Pain: 2 Post Procedural Pain: 2 Response to Treatment: Procedure was tolerated well Post Debridement Measurements of Total Wound Length: (cm) 0.9 Width: (cm) 0.9 Depth: (cm) 0.2 Volume: (cm) 0.127 Character of Wound/Ulcer Post Debridement: Requires Further Debridement Severity of Tissue Post Debridement: Fat layer exposed Post Procedure Diagnosis Same as Pre-procedure Electronic Signature(s) Signed: 02/19/2018 5:09:03 PM By: Linton Ham MD Signed: 02/19/2018 5:11:52 PM By: [name], [name], RN, CWS, [name], [name] Entered By: Linton Ham on 02/19/2018 10:31:09 [name], [name] ([id number]) -------------------------------------------------------------------------------- HPI Details Patient Name: [name]. Date of Service: 02/19/2018 9:45 AM Medical Record Number: [id number] Patient Account Number: [id number] Date of Birth/Sex:  April 27, 1946 (72 y.o. F) Treating RN: Cornell Barman Primary Care Provider: [name] Other Clinician: Referring Provider: [name] Treating Provider/Extender: Tito Dine in Treatment: 0 History of Present Illness HPI Description: Pleasant 72 year old with history of chronic venous insufficiency. No diabetes or peripheral vascular disease. Left ABI 1.29. Questionable history of left lower extremity DVT. She developed a recurrent ulceration on her left lateral calf in December 2015, which she attributes to poor diet and subsequent lower extremity edema. She underwent endovenous laser ablation of her left greater saphenous vein in 2010. She underwent laser ablation of accessory branch of left GSV in April 2016 by Dr. Kellie Simmering at [facility]. She was previously wearing Unna boots, which she tolerated well. Tolerating 2 layer compression and cadexomer iodine. She returns to clinic for follow-up and is without new complaints. She denies any significant pain at this time. She reports persistent pain with pressure. No claudication or ischemic rest pain. No fever or chills. No drainage. READMISSION 11/13/16; this is a 72 year old woman who is not a diabetic. She is here for a review of a painful area on her left medial lower extremity. I note that she was seen here previously last year for wound I believe to be in the same area. At that time she had undergone previously a left greater saphenous vein ablation by Dr. Kellie Simmering and she had a ablation of the anterior accessory branch of the left greater saphenous vein in March 2016. Seeing that the wound actually closed over. In reviewing the history with her today the ulcer in this area has been recurrent. She describes a biopsy of this area in 2009 that only showed stasis physiology. 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 Lutricia Feil of oncology and in August of this year she had  surgery for cervical spinal stenosis which left her with an improving Horner's syndrome on the left eye. Do not see that she has ever had arterial studies  in the left leg. She tells me she has a follow-up with Dr. Kellie Simmering in roughly 10 days In any case she developed the reopening of this area roughly a month ago. On the background of this she describes rapidly increasing edema which has responded to Lasix 40 mg and metolazone 2.5 mg as well as the patient's lymph massage. She has been told she has both venous insufficiency and lymphedema but she cannot tolerate compression stockings 11/28/16; the patient saw Dr. Kellie Simmering recently. Per the patient he did arterial Dopplers in the office that did not show evidence of arterial insufficiency, per the patient he stated "treat this like an ordinary venous ulcer". She also saw her dermatologist Dr. Ronnald Ramp who felt that this was more of a vascular ulcer. In general things are improving although she arrives today with increasing bilateral lower extremity edema with weeping a deeper fluid through the wound on the left medial leg compatible with some degree of lymphedema 12/04/16; the patient's wound is fully epithelialized but I don't think fully healed. We will do another week of depression with Promogran and TCA however I suspect we'll be able to discharge her next week. This is a very unusual-looking wound which was initially a figure-of-eight type wound lying on its side surrounded by petechial like hemorrhage. She has had venous ablation on this side. She apparently does not have an arterial issue per Dr. Kellie Simmering. She saw her dermatologist thought it was "vascular". Patient is definitely going to need ongoing compression and I talked about this with her today she will go to elastic therapy after she leaves here next week 12/11/16; the patient's wound is not completely closed today. She has surrounding scar tissue and in further discussion with the patient it  would appear that she had ulcers in this area in 2009 for a prolonged period of time ultimately requiring a punch biopsy of this area that only showed venous insufficiency. I did not previously pickup on this part of the history from the patient. 12/18/16; the patient's wound is completely epithelialized. There is no open area here. She has significant bilateral venous insufficiency with secondary lymphedema to a mild-to-moderate degree she does not have compression stockings.. She did not say anything to me when I was in the room, she told our intake nurse that she was still having pain in this area. This isn't unusual recurrent small open area. She is going to go to elastic therapy to obtain compression stockings. 12/25/16; the patient's wound is fully epithelialized. There is no open area here. The patient describes some continued episodic discomfort in this area medial left calf. However everything looks fine and healed here. She is been to elastic therapy and caught herself 15-20 mmHg stockings, they apparently were having trouble getting 20-30 mm stockings in her size [name], [name] ([id number]) 01/22/17; this is a patient we discharged from the clinic a month ago. She has a recurrent open wound on her medial left calf. She had 15 mm support stockings. I told her I thought she needed 20-30 mm compression stockings. She tells me that she has been ill with hospitalization secondary to asthma and is been found to have severe hypokalemia likely secondary to a combination of Lasix and metolazone. This morning she noted blistering and leaking fluid on the posterior part of her left leg. She called our intake nurse urgently and we was saw her this afternoon. She has not had any real discomfort here. I don't know that she's been wearing any stockings  on this leg for at least 2-3 days. ABIs in this clinic were 1.21 on the right and 1.3 on the left. She is previously seen vascular surgery who does not  think that there is a peripheral arterial issue. 01/30/17; Patient arrives with no open wound on the left leg. She has been to elastic therapy and obtained 20-69mmhg below knee stockings and she has one on the right leg today. READMISSION 02/19/18; this Albin is a now 72 year old patient we've had in this clinic perhaps 3 times before. I had last looked at her from January 07 December 2016 with an area on the medial left leg. We discharged her on 12/25/16 however she had to be readmitted on 01/22/17 with a recurrence. I have in my notes that we discharged her on 20-30 mm stockings although she tells me she was only wearing support hose because she cannot get stockings on predominantly related to her cervical spine surgery/issues. She has had previous ablations done by vein and vascular in [place] including a great saphenous vein ablation on the left with an anterior accessory branch ablation I think both of these were in 2016. On one of the previous visit she had a biopsy noted 2009 that was negative. She is not felt to have an arterial issue. She is not a diabetic. She does have a history of obstructive sleep apnea hypertension asthma as well as chronic venous insufficiency and lymphedema. On this occasion she noted 2 dry scaly patch on her left leg. She tried to put lotion on this it didn't really help. There were 2 open areas.the patient has been seeing her primary physician from 02/05/18 through 02/14/18. She had Unna boots applied. The superior wound now on the lateral left leg has closed but she's had one wound that remains open on the lateral left leg. This is not the same spot as we dealt with in 2018. ABIs in this clinic were 1.3 bilaterally Electronic Signature(s) Signed: 02/19/2018 5:09:03 PM By: Linton Ham MD Entered By: Linton Ham on 02/19/2018 10:36:18 Ganci, [name] ([id number]) -------------------------------------------------------------------------------- Physical Exam  Details Patient Name: CAREEN, MAUCH. Date of Service: 02/19/2018 9:45 AM Medical Record Number: [id number] Patient Account Number: [id number] Date of Birth/Sex: 1946-02-09 (72 y.o. F) Treating RN: Cornell Barman Primary Care Provider: [name] Other Clinician: Referring Provider: [name] Treating Provider/Extender: [name] Dillon Weeks in Treatment: 0 Constitutional Sitting or standing Blood Pressure is within target range for patient.. Pulse regular and within target range for patient.Marland Kitchen Respirations regular, non-labored and within target range.. Temperature is normal and within the target range for the patient.Marland Kitchen appears in no distress. Eyes Conjunctivae clear. No discharge. Respiratory Respiratory effort is easy and symmetric bilaterally. Rate is normal at rest and on room air.. Cardiovascular Femoral arteries without bruits and pulses strong.. Pedal pulses palpable and strong bilaterally.. Lymphatic none palpable in the popliteal or inguinal area. Integumentary (Hair, Skin) the patient has chronic venous insufficiency and some degree of secondary lymphedema. Dilated superficial veins are seen in her feet and elsewhere in her calves. Psychiatric No evidence of depression, anxiety, or agitation. Calm, cooperative, and communicative. Appropriate interactions and affect.. Notes wound exam; the area in question is on the lateral left leg. This is not the same wound area that we dealt with last year. She tells me she has not had wounds in this area previously. There are 2 small open areas on the lateral left leg. The superior one already is healed as noted. Inferiorly a nonviable surface  over a small wound. Using a #3 curet this was removed which would include nonviable subcutaneous tissue. Underneath the granulation looked healthy. There is no evidence of surrounding infection. Hemostasis was with direct pressure. There is no subcutaneous involvement of this area no  firmness Electronic Signature(s) Signed: 02/19/2018 5:09:03 PM By: Linton Ham MD Entered By: Linton Ham on 02/19/2018 10:54:15 Kosiba, [name] ([id number]) -------------------------------------------------------------------------------- Physician Orders Details Patient Name: ONESHA, KREBBS. Date of Service: 02/19/2018 9:45 AM Medical Record Number: [id number] Patient Account Number: [id number] Date of Birth/Sex: 01/25/1946 (72 y.o. F) Treating RN: Cornell Barman Primary Care Provider: [name] Other Clinician: Referring Provider: [name] Treating Provider/Extender: Tito Dine in Treatment: 0 Verbal / Phone Orders: No Diagnosis Coding ICD-10 Coding Code Description [phone] Non-pressure chronic ulcer of left calf with other specified severity I89.0 Lymphedema, not elsewhere classified I87.332 Chronic venous hypertension (idiopathic) with ulcer and inflammation of left lower extremity Wound Cleansing Wound #4 Left,Lateral Lower Leg o Clean wound with Normal Saline. o May Shower, gently pat wound dry prior to applying new dressing. Anesthetic (add to Medication List) Wound #4 Left,Lateral Lower Leg o Topical Lidocaine 4% cream applied to wound bed prior to debridement (In Clinic Only). o Benzocaine Topical Anesthetic Spray applied to wound bed prior to debridement (In Clinic Only). Primary Wound Dressing Wound #4 Left,Lateral Lower Leg o Silver Collagen Secondary Dressing Wound #4 Left,Lateral Lower Leg o ABD pad Dressing Change Frequency Wound #4 Left,Lateral Lower Leg o Change dressing every week Follow-up Appointments Wound #4 Left,Lateral Lower Leg o Return Appointment in 1 week. o Nurse Visit as needed Edema Control Wound #4 Left,Lateral Lower Leg o 4-Layer Compression System - Left Lower Extremity Patient Medications Allergies: Sulfa (Sulfonamide Antibiotics), latex, Neoprene [name], [name]  ([id number]) Notifications Medication Indication Start End lidocaine DOSE topical 4 % cream - cream topical benzocaine DOSE topical 10 % liquid - liquid topical Electronic Signature(s) Signed: 02/19/2018 5:09:03 PM By: Linton Ham MD Signed: 02/19/2018 5:11:52 PM By: [name], [name], RN, CWS, [name], [name] Entered By: [name], [name], RN, CWS, Kim on 02/19/2018 10:16:54 [name], [name] ([id number]) -------------------------------------------------------------------------------- Prescription 02/19/2018 Patient Name: [name] Provider: [name] Dillon MD Date of Birth: 11-28-1945 NPI#: [id number] Sex: F DEA#: [id number] Phone #: [phone] License #: [id number] Patient Address: [facility] [place], [place] [address] [address] [place], [place] [address] [phone] Allergies Sulfa (Sulfonamide Antibiotics) Reaction: facial swelling latex Neoprene Medication Medication: Route: Strength: Form: lidocaine topical 4% cream Class: TOPICAL LOCAL ANESTHETICS Dose: Frequency / Time: Indication: cream topical Number of Refills: Number of Units: 0 Generic Substitution: Start Date: End Date: Administered at Substitution Permitted Facility: Yes Time Administered: Time Discontinued: Note to Pharmacy: Signature(s): Date(s): [name], VIDA ([id number]) Prescription 02/19/2018 Patient Name: [name] Provider: [name] Dillon MD Date of Birth: 08/23/1946 NPI#: [id number] Sex: F DEA#: [id number] Phone #: [phone] License #: [id number] Patient Address: [facility] [name], [place] [address] [address], [place], [place] [address] [phone] Allergies Sulfa (Sulfonamide Antibiotics) Reaction: facial swelling latex Neoprene Medication Medication: Route: Strength: Form: benzocaine  topical 10 % liquid Class: TOPICAL LOCAL ANESTHETICS Dose: Frequency / Time: Indication: liquid topical Number of Refills: Number of Units: 0 Generic Substitution: Start Date: End Date: Administered at Substitution Permitted Facility: Yes Time Administered: Time Discontinued: Note to Pharmacy: Signature(s): Date(s): [employer],  MYSHA PEELER ([id number]) Electronic Signature(s) Signed: 02/19/2018 5:09:03 PM By: Linton Ham MD Signed: 02/19/2018 5:11:52 PM By: [name], [name], RN, CWS, [name], [name] Entered By: [name], [name], RN, CWS, Kim on 02/19/2018 10:16:55 LORRAYNE, ISMAEL ([id number]) --------------------------------------------------------------------------------  Problem List Details Patient Name: TAWONDA, LEGASPI. Date of Service: 02/19/2018 9:45 AM Medical Record Number: [id number] Patient Account Number: [id number] Date of Birth/Sex: 1946-08-17 (72 y.o. F) Treating RN: Cornell Barman Primary Care Provider: [name] Other Clinician: Referring Provider: [name] Treating Provider/Extender: Tito Dine in Treatment: 0 Active Problems ICD-10 Impacting Encounter Code Description Active Date Wound Healing Diagnosis L97.228 Non-pressure chronic ulcer of left calf with other specified 02/19/2018 Yes severity I89.0 Lymphedema, not elsewhere classified 02/19/2018 Yes I87.332 Chronic venous hypertension (idiopathic) with ulcer and 02/19/2018 Yes inflammation of left lower extremity Inactive Problems Resolved Problems Electronic Signature(s) Signed: 02/19/2018 5:09:03 PM By: Linton Ham MD Entered By: Linton Ham on 02/19/2018 10:30:13 Hudlow, [name] ([id number]) -------------------------------------------------------------------------------- Progress Note/History and Physical Details Patient Name: [name], [name]. Date of Service: 02/19/2018 9:45 AM Medical Record Number: [id number] Patient Account Number: [id number] Date of Birth/Sex: Jun 04, 1946 (72 y.o.  F) Treating RN: Cornell Barman Primary Care Provider: [name] Other Clinician: Referring Provider: [name] Treating Provider/Extender: Tito Dine in Treatment: 0 Subjective History of Present Illness (HPI) Pleasant 72 year old with history of chronic venous insufficiency. No diabetes or peripheral vascular disease. Left ABI 1.29. Questionable history of left lower extremity DVT. She developed a recurrent ulceration on her left lateral calf in December 2015, which she attributes to poor diet and subsequent lower extremity edema. She underwent endovenous laser ablation of her left greater saphenous vein in 2010. She underwent laser ablation of accessory branch of left GSV in April 2016 by Dr. Kellie Simmering at [facility]. She was previously wearing Unna boots, which she tolerated well. Tolerating 2 layer compression and cadexomer iodine. She returns to clinic for follow-up and is without new complaints. She denies any significant pain at this time. She reports persistent pain with pressure. No claudication or ischemic rest pain. No fever or chills. No drainage. READMISSION 11/13/16; this is a 72 year old woman who is not a diabetic. She is here for a review of a painful area on her left medial lower extremity. I note that she was seen here previously last year for wound I believe to be in the same area. At that time she had undergone previously a left greater saphenous vein ablation by Dr. Kellie Simmering and she had a ablation of the anterior accessory branch of the left greater saphenous vein in March 2016. Seeing that the wound actually closed over. In reviewing the history with her today the ulcer in this area has been recurrent. She describes a biopsy of this area in 2009 that only showed stasis physiology. 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 Do not see that she has ever had arterial studies in the left leg. She tells me she has a follow-up with Dr. Kellie Simmering in roughly 10 days In any case she developed the reopening of this area roughly a month ago. On the background of this she describes rapidly increasing edema which has responded to Lasix 40 mg and metolazone 2.5 mg as well as the patient's lymph massage.  She has been told she has both venous insufficiency and lymphedema but she cannot tolerate compression stockings 11/28/16; the patient saw Dr. Kellie Simmering recently. Per the patient he did arterial Dopplers in the office that did not show evidence of arterial insufficiency, per the patient he stated "treat this like an ordinary venous ulcer". She also saw her dermatologist Dr. Ronnald Ramp who felt that this was more of a vascular ulcer. In general things are improving although she arrives today with increasing bilateral lower extremity edema with weeping a deeper fluid through the wound on the left medial leg compatible with some degree of lymphedema 12/04/16; the patient's wound is fully epithelialized but I don't think fully healed. We will do another week of depression with Promogran and TCA however I suspect we'll be able to discharge her next week. This is a very unusual-looking wound which was initially a figure-of-eight type wound lying on its side surrounded by petechial like hemorrhage. She has had venous ablation on this side. She apparently does not have an arterial issue per Dr. Kellie Simmering. She saw her dermatologist thought it was "vascular". Patient is definitely going to need ongoing compression and I talked about this with her today she will go to elastic therapy after she leaves here next week 12/11/16; the patient's wound is not completely closed today. She has surrounding scar tissue and in further discussion with the patient it would appear  that she had ulcers in this area in 2009 for a prolonged period of time ultimately requiring a punch biopsy of this area that only showed venous insufficiency. I did not previously pickup on this part of the history from the patient. 12/18/16; the patient's wound is completely epithelialized. There is no open area here. She has significant bilateral venous insufficiency with secondary lymphedema to a mild-to-moderate degree she does not have compression stockings.. She did not say anything to me when I was in the room, she told our intake nurse that she was still having pain in this area. This isn't unusual recurrent small open area. She is going to go to elastic therapy to obtain compression stockings. 12/25/16; the patient's wound is fully epithelialized. There is no open area here. The patient describes some continued episodic discomfort in this area medial left calf. However everything looks fine and healed here. She is been to elastic therapy and [name], [name] ([id number]) caught herself 15-20 mmHg stockings, they apparently were having trouble getting 20-30 mm stockings in her size 01/22/17; this is a patient we discharged from the clinic a month ago. She has a recurrent open wound on her medial left calf. She had 15 mm support stockings. I told her I thought she needed 20-30 mm compression stockings. She tells me that she has been ill with hospitalization secondary to asthma and is been found to have severe hypokalemia likely secondary to a combination of Lasix and metolazone. This morning she noted blistering and leaking fluid on the posterior part of her left leg. She called our intake nurse urgently and we was saw her this afternoon. She has not had any real discomfort here. I don't know that she's been wearing any stockings on this leg for at least 2-3 days. ABIs in this clinic were 1.21 on the right and 1.3 on the left. She is previously seen vascular surgery who does not think that there  is a peripheral arterial issue. 01/30/17; Patient arrives with no open wound on the left leg. She has been to elastic therapy and obtained 20-18mmhg  below knee stockings and she has one on the right leg today. READMISSION 02/19/18; this Reining is a now 72 year old patient we've had in this clinic perhaps 3 times before. I had last looked at her from January 07 December 2016 with an area on the medial left leg. We discharged her on 12/25/16 however she had to be readmitted on 01/22/17 with a recurrence. I have in my notes that we discharged her on 20-30 mm stockings although she tells me she was only wearing support hose because she cannot get stockings on predominantly related to her cervical spine surgery/issues. She has had previous ablations done by vein and vascular in [place] including a great saphenous vein ablation on the left with an anterior accessory branch ablation I think both of these were in 2016. On one of the previous visit she had a biopsy noted 2009 that was negative. She is not felt to have an arterial issue. She is not a diabetic. She does have a history of obstructive sleep apnea hypertension asthma as well as chronic venous insufficiency and lymphedema. On this occasion she noted 2 dry scaly patch on her left leg. She tried to put lotion on this it didn't really help. There were 2 open areas.the patient has been seeing her primary physician from 02/05/18 through 02/14/18. She had Unna boots applied. The superior wound now on the lateral left leg has closed but she's had one wound that remains open on the lateral left leg. This is not the same spot as we dealt with in 2018. ABIs in this clinic were 1.3 bilaterally Wound History Patient presents with 1 open wound that has been present for approximately 2 weeks. Patient has been treating wound in the following manner: vaseline. The wound has been healed in the past but has re-opened. Laboratory tests have not been performed in the  last month. Patient reportedly has not tested positive for an antibiotic resistant organism. Patient reportedly has not tested positive for osteomyelitis. Patient reportedly has had testing performed to evaluate circulation in the legs. Patient experiences the following problems associated with their wounds: swelling. Patient History Information obtained from Patient. Allergies Sulfa (Sulfonamide Antibiotics) (Reaction: facial swelling), latex, Neoprene Family History Cancer - Mother,Paternal Grandparents, Hypertension - Mother,Father, Kidney Disease - Paternal Grandparents, Lung Disease - Paternal Grandparents, No family history of Diabetes, Heart Disease, Hereditary Spherocytosis, Seizures, Stroke, Thyroid Problems, Tuberculosis. Social History Former smoker - quit at age 81, Marital Status - Divorced, Alcohol Use - Never, Drug Use - No History, Caffeine Use - Daily. Medical History Eyes Patient has history of Cataracts Denies history of Glaucoma, Optic Neuritis Ear/Nose/Mouth/Throat Denies history of Chronic sinus problems/congestion, Middle ear problems Hematologic/Lymphatic Denies history of Anemia, Hemophilia, Human Immunodeficiency Virus, Lymphedema, Sickle Cell Disease Respiratory [name], [name] ([id number]) Patient has history of Asthma, Sleep Apnea Denies history of Aspiration, Chronic Obstructive Pulmonary Disease (COPD), Pneumothorax, Tuberculosis Cardiovascular Patient has history of Deep Vein Thrombosis - LLE 2010, Hypertension, Peripheral Venous Disease Denies history of Angina, Arrhythmia, Congestive Heart Failure, Coronary Artery Disease, Hypotension, Myocardial Infarction, Peripheral Arterial Disease, Phlebitis, Vasculitis Gastrointestinal Denies history of Cirrhosis , Colitis, Crohn s, Hepatitis A, Hepatitis B, Hepatitis C Endocrine Denies history of Type I Diabetes, Type II Diabetes Genitourinary Denies history of End Stage Renal Disease Immunological Denies  history of Lupus Erythematosus, Raynaud s, Scleroderma Integumentary (Skin) Denies history of History of Burn, History of pressure wounds Musculoskeletal Patient has history of Osteoarthritis - neck Denies history of Gout, Rheumatoid Arthritis, Osteomyelitis Neurologic  Denies history of Dementia, Neuropathy, Quadriplegia, Paraplegia, Seizure Disorder Oncologic Patient has history of Received Chemotherapy - interfeon immunotherapy, Received Radiation Psychiatric Denies history of Anorexia/bulimia, Confinement Anxiety Hospitalization/Surgery History - 08/05/2010, [facility], Melanoma sx. Medical And Surgical History Notes Constitutional Symptoms (General Health) Vein ablation LLE 2010 Vascular Sx ( vein ablationo) LLE 02/2015 Eyes Horners syndrome Genitourinary Kidney stones Neurologic ct scan showed swollen lymph nodes Oncologic Melanoma (R) shoulder 07/2010; (R) axillary lymphnode removal Review of Systems (ROS) Eyes Complains or has symptoms of Glasses / Contacts. Ear/Nose/Mouth/Throat The patient has no complaints or symptoms. Hematologic/Lymphatic The patient has no complaints or symptoms. Gastrointestinal The patient has no complaints or symptoms. Genitourinary The patient has no complaints or symptoms. Immunological The patient has no complaints or symptoms. Integumentary (Skin) Complains or has symptoms of Wounds. Neurologic carpal tunnel Oncologic melonoma Psychiatric [name], [name] ([id number]) The patient has no complaints or symptoms. Objective Constitutional Sitting or standing Blood Pressure is within target range for patient.. Pulse regular and within target range for patient.Marland Kitchen Respirations regular, non-labored and within target range.. Temperature is normal and within the target range for the patient.Marland Kitchen appears in no distress. Vitals Time Taken: 9:39 AM, Height: 63 in, Source: Stated, Weight: 219.9 lbs, Source: Measured, BMI: 38.9, Temperature: 97.7 F,  Pulse: 61 bpm, Respiratory Rate: 18 breaths/min, Blood Pressure: 134/70 mmHg. Eyes Conjunctivae clear. No discharge. Respiratory Respiratory effort is easy and symmetric bilaterally. Rate is normal at rest and on room air.. Cardiovascular Femoral arteries without bruits and pulses strong.. Pedal pulses palpable and strong bilaterally.. Lymphatic none palpable in the popliteal or inguinal area. Psychiatric No evidence of depression, anxiety, or agitation. Calm, cooperative, and communicative. Appropriate interactions and affect.. General Notes: wound exam; the area in question is on the lateral left leg. This is not the same wound area that we dealt with last year. She tells me she has not had wounds in this area previously. There are 2 small open areas on the lateral left leg. The superior one already is healed as noted. Inferiorly a nonviable surface over a small wound. Using a #3 curet this was removed which would include nonviable subcutaneous tissue. Underneath the granulation looked healthy. There is no evidence of surrounding infection. Hemostasis was with direct pressure. There is no subcutaneous involvement of this area no firmness Integumentary (Hair, Skin) the patient has chronic venous insufficiency and some degree of secondary lymphedema. Dilated superficial veins are seen in her feet and elsewhere in her calves. Wound #4 status is Open. Original cause of wound was Gradually Appeared. The wound is located on the Left,Lateral Lower Leg. The wound measures 0.8cm length x 0.9cm width x 0.1cm depth; 0.565cm^2 area and 0.057cm^3 volume. There is no tunneling or undermining noted. There is a medium amount of serous drainage noted. The wound margin is flat and intact. There is no granulation within the wound bed. There is a large (67-100%) amount of necrotic tissue within the wound bed including Adherent Slough. Periwound temperature was noted as No Abnormality. The periwound has  tenderness on palpation. [name], [name] ([id number]) Assessment Active Problems ICD-10 [id number] - Non-pressure chronic ulcer of left calf with other specified severity I89.0 - Lymphedema, not elsewhere classified I87.332 - Chronic venous hypertension (idiopathic) with ulcer and inflammation of left lower extremity Procedures Wound #4 Pre-procedure diagnosis of Wound #4 is a Venous Leg Ulcer located on the Left,Lateral Lower Leg .Severity of Tissue Pre Debridement is: Fat layer exposed. There was a Excisional Skin/Subcutaneous Tissue Debridement with a  total area of 0.72 sq cm performed by [name] Dillon, MD. With the following instrument(s): Curette. to remove Viable tissue/material Material removed includes Subcutaneous Tissue, and [place] after achieving pain control using Other (lidocaine 4%). No specimens were taken. A time out was conducted at 10:12, prior to the start of the procedure. A Minimum amount of bleeding was controlled with Pressure. The procedure was tolerated well with a pain level of 2 throughout and a pain level of 2 following the procedure. Post Debridement Measurements: 0.9cm length x 0.9cm width x 0.2cm depth; 0.127cm^3 volume. Character of Wound/Ulcer Post Debridement requires further debridement. Severity of Tissue Post Debridement is: Fat layer exposed. Post procedure Diagnosis Wound #4: Same as Pre-Procedure Plan Wound Cleansing: Wound #4 Left,Lateral Lower Leg: Clean wound with Normal Saline. May Shower, gently pat wound dry prior to applying new dressing. Anesthetic (add to Medication List): Wound #4 Left,Lateral Lower Leg: Topical Lidocaine 4% cream applied to wound bed prior to debridement (In Clinic Only). Benzocaine Topical Anesthetic Spray applied to wound bed prior to debridement (In Clinic Only). Primary Wound Dressing: Wound #4 Left,Lateral Lower Leg: Silver Collagen Secondary Dressing: Wound #4 Left,Lateral Lower Leg: ABD  pad Dressing Change Frequency: Wound #4 Left,Lateral Lower Leg: Change dressing every week Follow-up Appointments: Wound #4 Left,Lateral Lower Leg: Return Appointment in 1 week. Nurse Visit as needed Edema Control: Wound #4 Left,Lateral Lower Leg: [name], [name] ([id number]) 4-Layer Compression System - Left Lower Extremity The following medication(s) was prescribed: lidocaine topical 4 % cream cream topical was prescribed at facility benzocaine topical 10 % liquid liquid topical was prescribed at facility #1 we applied silver collagen/ABDs under 4-layer compression. #2 at this point I'm going to continue to monitor this. The history a little worrisome for the possibility of an alternative diagnosis such as a primary skin lesion, however I'm going to hold biopsying this area for now. #3 she has a history of recurrent venous stasis ulcerations status post ablation of the greater saphenous vein this is the lateral side. If this is recurrent or if the small area now gets worse then re-referral to pain and vascular might not be unreasonable [Dr. Lawson] #4 she does not have an arterial issue Electronic Signature(s) Signed: 02/19/2018 5:09:03 PM By: Linton Ham MD Entered By: Linton Ham on 02/19/2018 10:57:04 Tupper, [name] ([id number]) -------------------------------------------------------------------------------- ROS/PFSH Details Patient Name: SHAMYIA, GRANDPRE. Date of Service: 02/19/2018 9:45 AM Medical Record Number: [id number] Patient Account Number: [id number] Date of Birth/Sex: 06/09/1946 (72 y.o. F) Treating RN: Ahmed Prima Primary Care Provider: [name] Other Clinician: Referring Provider: [name] Treating Provider/Extender: Tito Dine in Treatment: 0 Label Progress Note Print Version as History and Physical for this encounter Information Obtained From Patient Wound History Do you currently have one or more open woundso Yes How  many open wounds do you currently haveo 1 Approximately how long have you had your woundso 2 weeks How have you been treating your wound(s) until nowo vaseline Has your wound(s) ever healed and then re-openedo Yes Have you had any lab work done in the past montho No Have you tested positive for an antibiotic resistant organism (MRSA, VRE)o No Have you tested positive for osteomyelitis (bone infection)o No Have you had any tests for circulation on your legso Yes Who ordered the testo [facility] Have you had other problems associated with your woundso Swelling Eyes Complaints and Symptoms: Positive for: Glasses / Contacts Medical History: Positive for: Cataracts Negative for: Glaucoma; Optic  Neuritis Past Medical History Notes: Horners syndrome Integumentary (Skin) Complaints and Symptoms: Positive for: Wounds Medical History: Negative for: History of Burn; History of pressure wounds Constitutional Symptoms (General Health) Medical History: Past Medical History Notes: Vein ablation LLE 2010 Vascular Sx ( vein ablationo) LLE 02/2015 Ear/Nose/Mouth/Throat Complaints and Symptoms: No Complaints or Symptoms Medical HistorySENAYA, DICENSO ([id number]) Negative for: Chronic sinus problems/congestion; Middle ear problems Hematologic/Lymphatic Complaints and Symptoms: No Complaints or Symptoms Medical History: Negative for: Anemia; Hemophilia; Human Immunodeficiency Virus; Lymphedema; Sickle Cell Disease Respiratory Medical History: Positive for: Asthma; Sleep Apnea Negative for: Aspiration; Chronic Obstructive Pulmonary Disease (COPD); Pneumothorax; Tuberculosis Cardiovascular Medical History: Positive for: Deep Vein Thrombosis - LLE 2010; Hypertension; Peripheral Venous Disease Negative for: Angina; Arrhythmia; Congestive Heart Failure; Coronary Artery Disease; Hypotension; Myocardial Infarction; Peripheral Arterial Disease; Phlebitis;  Vasculitis Gastrointestinal Complaints and Symptoms: No Complaints or Symptoms Medical History: Negative for: Cirrhosis ; Colitis; Crohnos; Hepatitis A; Hepatitis B; Hepatitis C Endocrine Medical History: Negative for: Type I Diabetes; Type II Diabetes Genitourinary Complaints and Symptoms: No Complaints or Symptoms Medical History: Negative for: End Stage Renal Disease Past Medical History Notes: Kidney stones Immunological Complaints and Symptoms: No Complaints or Symptoms Medical History: Negative for: Lupus Erythematosus; Raynaudos; Scleroderma Musculoskeletal Medical History: Positive for: Osteoarthritis - neck Negative for: Gout; Rheumatoid Arthritis; Osteomyelitis [name], Minnah J. ([id number]) Neurologic Complaints and Symptoms: Review of System Notes: carpal tunnel Medical History: Negative for: Dementia; Neuropathy; Quadriplegia; Paraplegia; Seizure Disorder Past Medical History Notes: ct scan showed swollen lymph nodes Oncologic Complaints and Symptoms: Review of System Notes: melonoma Medical History: Positive for: Received Chemotherapy - interfeon immunotherapy; Received Radiation Past Medical History Notes: Melanoma (R) shoulder 07/2010; (R) axillary lymphnode removal Psychiatric Complaints and Symptoms: No Complaints or Symptoms Medical History: Negative for: Anorexia/bulimia; Confinement Anxiety HBO Extended History Items Eyes: Cataracts Immunizations Pneumococcal Vaccine: Received Pneumococcal Vaccination: Yes Immunization Notes: tetanus shot w/in the last 5 years per pt Implantable Devices Hospitalization / Surgery History Name of Hospital Purpose of Hospitalization/Surgery Date [facility] Melanoma sx 08/05/2010 Family and Social History Cancer: Yes - Mother,Paternal Grandparents; Diabetes: No; Heart Disease: No; Hereditary Spherocytosis: No; Hypertension: Yes - Mother,Father; Kidney Disease: Yes - Paternal Grandparents; Lung Disease: Yes -  Paternal Grandparents; Seizures: No; Stroke: No; Thyroid Problems: No; Tuberculosis: No; Former smoker - quit at age 89; Marital Status - Divorced; Alcohol Use: Never; Drug Use: No History; Caffeine Use: Daily; Financial Concerns: No; Food, Clothing or Shelter Needs: No; Support System Lacking: No; Transportation Concerns: No; Advanced Directives: No; Patient does not want information on Advanced Directives; Do not resuscitate: No; Living Will: No; Medical Power of Attorney: No Electronic Signature(s) Signed: 02/19/2018 5:09:03 PM By: Linton Ham MD Signed: 02/20/2018 5:13:37 PM By: Jory Ee ([id number]) Entered By: Alric Quan on 02/19/2018 09:48:27 IEESHA, [name] ([id number]) -------------------------------------------------------------------------------- [place] Details Patient Name: IFEOMA, VALLIN. Date of Service: 02/19/2018 Medical Record Number: [id number] Patient Account Number: [id number] Date of Birth/Sex: 03-14-1946 (72 y.o. F) Treating RN: Cornell Barman Primary Care Provider: [name] Other Clinician: Referring Provider: [name] Treating Provider/Extender: Tito Dine in Treatment: 0 Diagnosis Coding ICD-10 Codes Code Description [phone] Non-pressure chronic ulcer of left calf with other specified severity I89.0 Lymphedema, not elsewhere classified I87.332 Chronic venous hypertension (idiopathic) with ulcer and inflammation of left lower extremity Facility Procedures CPT4 Code: 60630160 Description: 99213 - WOUND CARE VISIT-LEV 3 EST PT Modifier: Quantity: 1 CPT4 Code: [id number] Description: 11042 - DEB SUBQ TISSUE 20 SQ CM/< ICD-10  Diagnosis Description L97.228 Non-pressure chronic ulcer of left calf with other specified s Modifier: everity Quantity: 1 Physician Procedures CPT4: Description Modifier Quantity Code [id number] 75102 - WC PHYS LEVEL 3 - EST PT 25 1 ICD-10 Diagnosis Description L97.228 Non-pressure  chronic ulcer of left calf with other specified severity I89.0 Lymphedema, not elsewhere classified I87.332 Chronic  venous hypertension (idiopathic) with ulcer and inflammation of left lower extremity CPT4: 5852778 11042 - WC PHYS SUBQ TISS 20 SQ CM 1 ICD-10 Diagnosis Description L97.228 Non-pressure chronic ulcer of left calf with other specified severity Electronic Signature(s) Signed: 02/19/2018 10:57:51 AM By: Linton Ham MD Entered By: Linton Ham on 02/19/2018 10:57:50

## 2018-02-25 NOTE — Progress Notes (Signed)
AZHA, CONSTANTIN ([id number]) Visit Report for 02/19/2018 Allergy List Details Patient Name: [name], [name]. Date of Service: 02/19/2018 9:45 AM Medical Record Number: [id number] Patient Account Number: [id number] Date of Birth/Sex: [birth year] (72 y.o. F) Treating RN: Ahmed Prima Primary Care Dora Simeone: [name] Other Clinician: Referring Natanael Saladin: [name] Treating Jonte Wollam/Extender: [name] Dillon Weeks in Treatment: 0 Allergies Active Allergies Sulfa (Sulfonamide Antibiotics) Reaction: facial swelling latex Neoprene Allergy Notes Electronic Signature(s) Signed: 02/20/2018 5:13:37 PM By: Alric Quan Entered By: Alric Quan on 02/19/2018 09:43:36 Baltzell, [name] ([id number]) -------------------------------------------------------------------------------- Arrival Information Details Patient Name: [name], [name]. Date of Service: 02/19/2018 9:45 AM Medical Record Number: [id number] Patient Account Number: [id number] Date of Birth/Sex: [birth year] (72 y.o. F) Treating RN: Ahmed Prima Primary Care Seynabou Fults: [name] Other Clinician: Referring Seon Gaertner: [name] Treating Keneisha Heckart/Extender: Tito Dine in Treatment: 0 Visit Information Patient Arrived: Ambulatory Arrival Time: 09:39 Accompanied By: self Transfer Assistance: None Patient Identification Verified: Yes Secondary Verification Process Completed: Yes Patient Requires Transmission-Based No Precautions: Patient Has Alerts: No History Since Last Visit All ordered tests and consults were completed: No Added or deleted any medications: No Any new allergies or adverse reactions: No Had a fall or experienced change in activities of daily living that may affect risk of falls: No Signs or symptoms of abuse/neglect since last visito No Hospitalized since last visit: No Implantable device outside of the clinic excluding cellular tissue based products placed in the  center since last visit: No Has Dressing in Place as Prescribed: Yes Electronic Signature(s) Signed: 02/20/2018 5:13:37 PM By: Alric Quan Entered By: Alric Quan on 02/19/2018 09:39:27 Rotolo, [name] ([id number]) -------------------------------------------------------------------------------- Clinic Level of Care Assessment Details Patient Name: [name], [name]. Date of Service: 02/19/2018 9:45 AM Medical Record Number: [id number] Patient Account Number: [id number] Date of Birth/Sex: 1946/07/21 (72 y.o. F) Treating RN: Cornell Barman Primary Care Wilborn Membreno: [name] Other Clinician: Referring Tsion Inghram: [name] Treating Jazmine Longshore/Extender: Tito Dine in Treatment: 0 Clinic Level of Care Assessment Items TOOL 1 Quantity Score []  - Use when EandM and Procedure is performed on INITIAL visit 0 ASSESSMENTS - Nursing Assessment / Reassessment X - General Physical Exam (combine w/ comprehensive assessment (listed just below) when 1 20 performed on new pt. evals) X- 1 25 Comprehensive Assessment (HX, ROS, Risk Assessments, Wounds Hx, etc.) ASSESSMENTS - Wound and Skin Assessment / Reassessment []  - Dermatologic / Skin Assessment (not related to wound area) 0 ASSESSMENTS - Ostomy and/or Continence Assessment and Care []  - Incontinence Assessment and Management 0 []  - 0 Ostomy Care Assessment and Management (repouching, etc.) PROCESS - Coordination of Care X - Simple Patient / Family Education for ongoing care 1 15 []  - 0 Complex (extensive) Patient / Family Education for ongoing care []  - 0 Staff obtains Programmer, systems, Records, Test Results / Process Orders []  - 0 Staff telephones HHA, Nursing Homes / Clarify orders / etc []  - 0 Routine Transfer to another Facility (non-emergent condition) []  - 0 Routine Hospital Admission (non-emergent condition) X- 1 15 New Admissions / Biomedical engineer / Ordering NPWT, Apligraf, etc. []  - 0 Emergency  Hospital Admission (emergent condition) PROCESS - Special Needs []  - Pediatric / Minor Patient Management 0 []  - 0 Isolation Patient Management []  - 0 Hearing / Language / Visual special needs []  - 0 Assessment of Community assistance (transportation, D/C planning, etc.) []  - 0 Additional assistance / Altered mentation []  - 0 Support Surface(s) Assessment (bed, cushion, seat, etc.) [name], Amber  [name] Kitchen ([id number]) INTERVENTIONS - Miscellaneous []  - External ear exam 0 []  - 0 Patient Transfer (multiple staff / Harrel Lemon Lift / Similar devices) []  - 0 Simple Staple / Suture removal (25 or less) []  - 0 Complex Staple / Suture removal (26 or more) []  - 0 Hypo/Hyperglycemic Management (do not check if billed separately) X- 1 15 Ankle / Brachial Index (ABI) - do not check if billed separately Has the patient been seen at the hospital within the last three years: Yes Total Score: 90 Level Of Care: New/Established - Level 3 Electronic Signature(s) Signed: 02/19/2018 5:11:52 PM By: [name], [name], RN, CWS, [name], [name] Entered By: [name], [name], RN, CWS, Kim on 02/19/2018 10:17:23 [name] ([id number]) -------------------------------------------------------------------------------- Encounter Discharge Information Details Patient Name: [name], [name]. Date of Service: 02/19/2018 9:45 AM Medical Record Number: [id number] Patient Account Number: [id number] Date of Birth/Sex: 22-Aug-1946 (72 y.o. F) Treating RN: Cornell Barman Primary Care Lilyann Gravelle: [name] Other Clinician: Referring Kaile Bixler: [name] Treating Melony Tenpas/Extender: Tito Dine in Treatment: 0 Encounter Discharge Information Items Discharge Pain Level: 0 Discharge Condition: Stable Ambulatory Status: Ambulatory Discharge Destination: Home Transportation: Private Auto Accompanied By: self Schedule Follow-up Appointment: Yes Medication Reconciliation completed and No provided to Patient/Care  Saliha Salts: Provided on Clinical Summary of Care: 02/19/2018 Form Type Recipient Paper Patient LP Electronic Signature(s) Signed: 02/19/2018 11:08:24 AM By: Montey Hora Entered By: Montey Hora on 02/19/2018 11:08:23 [name] ([id number]) -------------------------------------------------------------------------------- Lower Extremity Assessment Details Patient Name: [name], [name]. Date of Service: 02/19/2018 9:45 AM Medical Record Number: [id number] Patient Account Number: [id number] Date of Birth/Sex: 09-01-1946 (72 y.o. F) Treating RN: Ahmed Prima Primary Care Rayelle Armor: [name] Other Clinician: Referring Rosaleen Mazer: [name] Treating Bahar Shelden/Extender: Tito Dine in Treatment: 0 Edema Assessment Assessed: [Left: No] [Right: No] [Left: Edema] [Right: :] Calf Left: Right: Point of Measurement: 32 cm From Medial Instep 41.3 cm 40.6 cm Ankle Left: Right: Point of Measurement: 12 cm From Medial Instep 23.5 cm 23.6 cm Vascular Assessment Pulses: Dorsalis Pedis Palpable: [Left:Yes] [Right:Yes] Doppler Audible: [Left:Yes] [Right:Yes] Posterior Tibial Palpable: [Left:Yes] [Right:Yes] Doppler Audible: [Left:Yes] [Right:Yes] Extremity colors, hair growth, and conditions: Extremity Color: [Left:Mottled] [Right:Mottled] Hair Growth on Extremity: [Left:Yes] [Right:Yes] Temperature of Extremity: [Left:Warm] [Right:Warm] Capillary Refill: [Left:< 3 seconds] [Right:< 3 seconds] Blood Pressure: Brachial: [Left:126] Dorsalis Pedis: 160 [Left:Dorsalis Pedis: 130] Ankle: Posterior Tibial: 164 [Left:Posterior Tibial: 164 1.30] [Right:1.30] Toe Nail Assessment Left: Right: Thick: Yes Yes Discolored: Yes Yes Deformed: Yes Yes Improper Length and Hygiene: No No Electronic Signature(s) Signed: 02/20/2018 5:13:37 PM By: Alric Quan Entered By: Alric Quan on 02/19/2018 10:03:39 CAYCI, MCNABB ([id number]) Yeh, [name]  ([id number]) -------------------------------------------------------------------------------- Multi Wound Chart Details Patient Name: [name], [name]. Date of Service: 02/19/2018 9:45 AM Medical Record Number: [id number] Patient Account Number: [id number] Date of Birth/Sex: 1946/02/01 (72 y.o. F) Treating RN: Cornell Barman Primary Care Mykell Genao: [name] Other Clinician: Referring Jaidon Sponsel: [name] Treating Kenrick Pore/Extender: Tito Dine in Treatment: 0 Vital Signs Height(in): 63 Pulse(bpm): 61 Weight(lbs): 219.9 Blood Pressure(mmHg): 134/70 Body Mass Index(BMI): 39 Temperature(F): 97.7 Respiratory Rate 18 (breaths/min): Photos: [4:No Photos] [N/A:N/A] Wound Location: [4:Left Lower Leg - Lateral] [N/A:N/A] Wounding Event: [4:Gradually Appeared] [N/A:N/A] Primary Etiology: [4:Venous Leg Ulcer] [N/A:N/A] Comorbid History: [4:Cataracts, Asthma, Sleep Apnea, Deep Vein Thrombosis, Hypertension, Peripheral Venous Disease, Osteoarthritis, Received Chemotherapy, Received Radiation] [N/A:N/A] Date Acquired: [4:02/05/2018] [N/A:N/A] Weeks of Treatment: [4:0] [N/A:N/A] Wound Status: [4:Open] [N/A:N/A] Measurements L x W x D [4:0.8x0.9x0.1] [N/A:N/A] (cm) Area (  cm) : [4:0.565] [N/A:N/A] Volume (cm) : [4:0.057] [N/A:N/A] Classification: [4:Full Thickness Without Exposed Support Structures] [N/A:N/A] Exudate Amount: [4:Medium] [N/A:N/A] Exudate Type: [4:Serous] [N/A:N/A] Exudate Color: [4:amber] [N/A:N/A] Wound Margin: [4:Flat and Intact] [N/A:N/A] Granulation Amount: [4:None Present (0%)] [N/A:N/A] Necrotic Amount: [4:Large (67-100%)] [N/A:N/A] Exposed Structures: [4:Fascia: No Fat Layer (Subcutaneous Tissue) Exposed: No Tendon: No Muscle: No Joint: No Bone: No] [N/A:N/A] Epithelialization: [4:None] [N/A:N/A] Debridement: [4:Debridement - Excisional] [N/A:N/A] Pre-procedure [4:10:12] [N/A:N/A] Verification/Time Out Taken: [name], LIZAOLA ([id number]) Pain  Control: Other N/A N/A Tissue Debrided: Subcutaneous, Slough N/A N/A Level: Skin/Subcutaneous Tissue N/A N/A Debridement Area (sq cm): 0.72 N/A N/A Instrument: Curette N/A N/A Bleeding: Minimum N/A N/A Hemostasis Achieved: Pressure N/A N/A Procedural Pain: 2 N/A N/A Post Procedural Pain: 2 N/A N/A Debridement Treatment Procedure was tolerated well N/A N/A Response: Post Debridement 0.9x0.9x0.2 N/A N/A Measurements L x W x D (cm) Post Debridement Volume: 0.127 N/A N/A (cm) Periwound Skin Texture: No Abnormalities Noted N/A N/A Periwound Skin Moisture: No Abnormalities Noted N/A N/A Periwound Skin Color: No Abnormalities Noted N/A N/A Temperature: No Abnormality N/A N/A Tenderness on Palpation: Yes N/A N/A Wound Preparation: Ulcer Cleansing: N/A N/A Rinsed/Irrigated with Saline Topical Anesthetic Applied: Other: lidocaine 4% Procedures Performed: Debridement N/A N/A Treatment Notes Electronic Signature(s) Signed: 02/19/2018 5:09:03 PM By: Linton Ham MD Entered By: Linton Ham on 02/19/2018 10:30:41 Brickner, [name] ([id number]) -------------------------------------------------------------------------------- Multi-Disciplinary Care Plan Details Patient Name: [name], [name]. Date of Service: 02/19/2018 9:45 AM Medical Record Number: [id number] Patient Account Number: [id number] Date of Birth/Sex: [birth year] (72 y.o. F) Treating RN: Cornell Barman Primary Care [name] [name]: [name] Other Clinician: Referring Jakayla Schweppe: [name] Treating Hiroyuki Ozanich/Extender: Tito Dine in Treatment: 0 Active Inactive ` Orientation to the Wound Care Program Nursing Diagnoses: Knowledge deficit related to the wound healing center program Goals: Patient/caregiver will verbalize understanding of the [facility] Date Initiated: 02/19/2018 Target Resolution Date: 03/21/2018 Goal Status: Active Interventions: Provide education on orientation to the  wound center Notes: ` Venous Leg Ulcer Nursing Diagnoses: Actual venous Insuffiency (use after diagnosis is confirmed) Goals: Patient will maintain optimal edema control Date Initiated: 02/19/2018 Target Resolution Date: 03/21/2018 Goal Status: Active Interventions: Compression as ordered Treatment Activities: Therapeutic compression applied : 02/19/2018 Notes: ` Wound/Skin Impairment Nursing Diagnoses: Impaired tissue integrity Goals: Ulcer/skin breakdown will have a volume reduction of 80% by week 12 Date Initiated: 02/19/2018 Target Resolution Date: 06/21/2018 KENAE, LINDQUIST ([id number]) Goal Status: Active Interventions: Assess ulceration(s) every visit Treatment Activities: Topical wound management initiated : 02/19/2018 Notes: Electronic Signature(s) Signed: 02/19/2018 5:11:52 PM By: [name], [name], RN, CWS, [name], [name] Entered By: [name], [name], RN, CWS, Kim on 02/19/2018 10:10:15 [name] ([id number]) -------------------------------------------------------------------------------- Pain Assessment Details Patient Name: [name], [name]. Date of Service: 02/19/2018 9:45 AM Medical Record Number: [id number] Patient Account Number: [id number] Date of Birth/Sex: 1945-12-31 (72 y.o. F) Treating RN: Ahmed Prima Primary Care Antoinne Spadaccini: [name] Other Clinician: Referring Jasmin Trumbull: [name] Treating Stasia Somero/Extender: Tito Dine in Treatment: 0 Active Problems Location of Pain Severity and Description of Pain Patient Has Paino No Site Locations Pain Management and Medication Current Pain Management: Electronic Signature(s) Signed: 02/20/2018 5:13:37 PM By: Alric Quan Entered By: Alric Quan on 02/19/2018 09:39:34 Bump, [name] ([id number]) -------------------------------------------------------------------------------- Patient/Caregiver Education Details Patient Name: [name], [name]. Date of Service: 02/19/2018 9:45  AM Medical Record Number: [id number] Patient Account Number: [id number] Date of Birth/Gender: July 17, 1946 (72 y.o. F) Treating RN: Montey Hora Primary Care Physician: Danise Mina,  JAVIER Other Clinician: Referring Physician: [name] Treating Physician/Extender: Tito Dine in Treatment: 0 Education Assessment Education Provided To: Patient Education Topics Provided Venous: Handouts: Other: leg elevation and need for ongoing compression to both lower legs Electronic Signature(s) Signed: 02/19/2018 4:39:21 PM By: Montey Hora Entered By: Montey Hora on 02/19/2018 11:08:54 [name] ([id number]) -------------------------------------------------------------------------------- Wound Assessment Details Patient Name: [name], [name]. Date of Service: 02/19/2018 9:45 AM Medical Record Number: [id number] Patient Account Number: [id number] Date of Birth/Sex: Feb 25, 1946 (72 y.o. F) Treating RN: Ahmed Prima Primary Care Gianne Shugars: [name] Other Clinician: Referring Kaedin Hicklin: [name] Treating Samarah Hogle/Extender: [name] Dillon Weeks in Treatment: 0 Wound Status Wound Number: 4 Primary Venous Leg Ulcer Etiology: Wound Location: Left Lower Leg - Lateral Wound Open Wounding Event: Gradually Appeared Status: Date Acquired: 02/05/2018 Comorbid Cataracts, Asthma, Sleep Apnea, Deep Vein Weeks Of Treatment: 0 History: Thrombosis, Hypertension, Peripheral Venous Clustered Wound: No Disease, Osteoarthritis, Received Chemotherapy, Received Radiation Photos Photo Uploaded By: Roger Shelter on 02/19/2018 16:34:22 Wound Measurements Length: (cm) 0.8 Width: (cm) 0.9 Depth: (cm) 0.1 Area: (cm) 0.565 Volume: (cm) 0.057 % Reduction in Area: % Reduction in Volume: Epithelialization: None Tunneling: No Undermining: No Wound Description Full Thickness Without Exposed Support Classification: Structures Wound Margin: Flat and  Intact Exudate Medium Amount: Exudate Type: Serous Exudate Color: amber Foul Odor After Cleansing: No Slough/Fibrino Yes Wound Bed Granulation Amount: None Present (0%) Exposed Structure Necrotic Amount: Large (67-100%) Fascia Exposed: No Necrotic Quality: Adherent Slough Fat Layer (Subcutaneous Tissue) Exposed: No Tendon Exposed: No Muscle Exposed: No Joint Exposed: No KWANZA, CANCELLIERE. ([id number]) Bone Exposed: No Periwound Skin Texture Texture Color No Abnormalities Noted: No No Abnormalities Noted: No Moisture Temperature / Pain No Abnormalities Noted: No Temperature: No Abnormality Tenderness on Palpation: Yes Wound Preparation Ulcer Cleansing: Rinsed/Irrigated with Saline Topical Anesthetic Applied: Other: lidocaine 4%, Treatment Notes Wound #4 (Left, Lateral Lower Leg) 1. Cleansed with: Clean wound with Normal Saline 2. Anesthetic Topical Lidocaine 4% cream to wound bed prior to debridement 3. Peri-wound Care: Moisturizing lotion 4. Dressing Applied: Prisma Ag 5. Secondary Dressing Applied ABD Pad 7. Secured with 4-Layer Compression System - Left Lower Extremity Notes unna to anchor Electronic Signature(s) Signed: 02/20/2018 5:13:37 PM By: Alric Quan Entered By: Alric Quan on 02/19/2018 09:55:21 Rota, [name] ([id number]) -------------------------------------------------------------------------------- Vitals Details Patient Name: LORRENA, GORANSON. Date of Service: 02/19/2018 9:45 AM Medical Record Number: [id number] Patient Account Number: [id number] Date of Birth/Sex: 1946/10/29 (72 y.o. F) Treating RN: Ahmed Prima Primary Care Dung Salinger: [name] Other Clinician: Referring Doyce Saling: [name] Treating Lamarkus Nebel/Extender: Tito Dine in Treatment: 0 Vital Signs Time Taken: 09:39 Temperature (F): 97.7 Height (in): 63 Pulse (bpm): 61 Source: Stated Respiratory Rate (breaths/min): 18 Weight (lbs):  219.9 Blood Pressure (mmHg): 134/70 Source: Measured Reference Range: 80 - 120 mg / dl Body Mass Index (BMI): 38.9 Electronic Signature(s) Signed: 02/20/2018 5:13:37 PM By: Alric Quan Entered By: Alric Quan on 02/19/2018 09:41:31

## 2018-02-25 NOTE — Progress Notes (Signed)
[name] ([id number]) Visit Report for 02/19/2018 Abuse/Suicide Risk Screen Details Patient Name: [name], [name]. Date of Service: 02/19/2018 9:45 AM Medical Record Number: [id number] Patient Account Number: [id number] Date of Birth/Sex: 08/17/1946 (72 y.o. F) Treating RN: Ahmed Prima Primary Care Jeovany Huitron: [name] Other Clinician: Referring Brasen Bundren: [name] Treating Shaterra Sanzone/Extender: Tito Dine in Treatment: 0 Abuse/Suicide Risk Screen Items Answer ABUSE/SUICIDE RISK SCREEN: Has anyone close to you tried to hurt or harm you recentlyo No Do you feel uncomfortable with anyone in your familyo No Has anyone forced you do things that you didnot want to doo No Do you have any thoughts of harming yourselfo No Patient displays signs or symptoms of abuse and/or neglect. No Electronic Signature(s) Signed: 02/20/2018 5:13:37 PM By: Alric Quan Entered By: Alric Quan on 02/19/2018 09:48:44 [place]artsell, [name] ([id number]) -------------------------------------------------------------------------------- Activities of Daily Living Details Patient Name: [name]. Date of Service: 02/19/2018 9:45 AM Medical Record Number: [id number] Patient Account Number: [id number] Date of Birth/Sex: [birth year] (72 y.o. F) Treating RN: Ahmed Prima Primary Care Laterrance Nauta: [name] Other Clinician: Referring Kamren Heintzelman: [name] Treating Eydan Chianese/Extender: Tito Dine in Treatment: 0 Activities of Daily Living Items Answer Activities of Daily Living (Please select one for each item) Drive Automobile Completely Able Take Medications Completely Able Use Telephone Completely Able Care for Appearance Completely Able Use Toilet Completely Able Bath / Shower Completely Able Dress Self Completely Able Feed Self Completely Able Walk Completely Able Get In / Out Bed Completely Able Housework Completely Able Prepare Meals  Completely Able Handle Money Completely Able Shop for Self Completely Able Electronic Signature(s) Signed: 02/20/2018 5:13:37 PM By: Alric Quan Entered By: Alric Quan on 02/19/2018 [place], [name] ([id number]) -------------------------------------------------------------------------------- Education Assessment Details Patient Name: [name]. Date of Service: 02/19/2018 9:45 AM Medical Record Number: [id number] Patient Account Number: [id number] Date of Birth/Sex: 1946-08-17 (72 y.o. F) Treating RN: Ahmed Prima Primary Care Raymar Joiner: [name] Other Clinician: Referring Alexza Norbeck: [name] Treating Hidaya Daniel/Extender: Tito Dine in Treatment: 0 Primary Learner Assessed: Patient Learning Preferences/Education Level/Primary Language Learning Preference: Explanation, Printed Material Highest Education Level: College or Above Preferred Language: English Cognitive Barrier Assessment/Beliefs Language Barrier: No Translator Needed: No Memory Deficit: No Emotional Barrier: No Cultural/Religious Beliefs Affecting Medical Care: No Physical Barrier Assessment Impaired Vision: Yes Glasses Impaired Hearing: No Decreased Hand dexterity: No Knowledge/Comprehension Assessment Knowledge Level: Medium Comprehension Level: Medium Ability to understand written Medium instructions: Ability to understand verbal Medium instructions: Motivation Assessment Anxiety Level: Calm Cooperation: Cooperative Education Importance: Acknowledges Need Interest in Health Problems: Asks Questions Perception: Coherent Willingness to Engage in Self- Medium Management Activities: Readiness to Engage in Self- Medium Management Activities: Electronic Signature(s) Signed: 02/20/2018 5:13:37 PM By: Alric Quan Entered By: Alric Quan on 02/19/2018 09:51:03 [name], [name]  ([id number]) -------------------------------------------------------------------------------- Fall Risk Assessment Details Patient Name: [name]. Date of Service: 02/19/2018 9:45 AM Medical Record Number: [id number] Patient Account Number: [id number] Date of Birth/Sex: [birth year] (72 y.o. F) Treating RN: Ahmed Prima Primary Care Crimson Beer: [name] Other Clinician: Referring Jayde Daffin: [name] Treating Krishana Lutze/Extender: Tito Dine in Treatment: 0 Fall Risk Assessment Items Have you had 2 or more falls in the last 12 monthso 0 No Have you had any fall that resulted in injury in the last 12 monthso 0 No FALL RISK ASSESSMENT: History of falling - immediate or within 3 months 0 No Secondary diagnosis 0 No Ambulatory aid None/bed rest/wheelchair/nurse 0 No Crutches/cane/walker 0  No Furniture 0 No IV Access/Saline Lock 0 No Gait/Training Normal/bed rest/immobile 0 No Weak 0 No Impaired 0 No Mental Status Oriented to own ability 0 Yes Electronic Signature(s) Signed: 02/20/2018 5:13:37 PM By: Alric Quan Entered By: Alric Quan on 02/19/2018 09:51:19 [place], [name] ([id number]) -------------------------------------------------------------------------------- Foot Assessment Details Patient Name: [name]. Date of Service: 02/19/2018 9:45 AM Medical Record Number: [id number] Patient Account Number: [id number] Date of Birth/Sex: [birth year] (72 y.o. F) Treating RN: Ahmed Prima Primary Care Branden Shallenberger: [name] Other Clinician: Referring Hillel Card: [name] Treating Sirinity Outland/Extender: Tito Dine in Treatment: 0 Foot Assessment Items Site Locations + = Sensation present, - = Sensation absent, C = Callus, U = Ulcer R = Redness, W = Warmth, M = Maceration, PU = Pre-ulcerative lesion F = Fissure, S = Swelling, D = Dryness Assessment Right: Left: Other Deformity: No No Prior Foot Ulcer: No No Prior  Amputation: No No Charcot Joint: No No Ambulatory Status: Ambulatory Without Help Gait: Steady Electronic Signature(s) Signed: 02/20/2018 5:13:37 PM By: Alric Quan Entered By: Alric Quan on 02/19/2018 09:53:31 Knauer, [name] ([id number]) -------------------------------------------------------------------------------- Nutrition Risk Assessment Details Patient Name: [name], Amber. Date of Service: 02/19/2018 9:45 AM Medical Record Number: [id number] Patient Account Number: [id number] Date of Birth/Sex: November 05, 1946 (72 y.o. F) Treating RN: Ahmed Prima Primary Care Taseen Marasigan: [name] Other Clinician: Referring Nocholas Damaso: [name] Treating Aasia Peavler/Extender: Tito Dine in Treatment: 0 Height (in): 63 Weight (lbs): 219.9 Body Mass Index (BMI): 38.9 Nutrition Risk Assessment Items NUTRITION RISK SCREEN: I have an illness or condition that made me change the kind and/or amount of 0 No food I eat I eat fewer than two meals per day 0 No I eat few fruits and vegetables, or milk products 0 No I have three or more drinks of beer, liquor or wine almost every day 0 No I have tooth or mouth problems that make it hard for me to eat 0 No I don't always have enough money to buy the food I need 0 No I eat alone most of the time 0 No I take three or more different prescribed or over-the-counter drugs a day 1 Yes Without wanting to, I have lost or gained 10 pounds in the last six months 0 No I am not always physically able to shop, cook and/or feed myself 0 No Nutrition Protocols Good Risk Protocol Moderate Risk Protocol Electronic Signature(s) Signed: 02/20/2018 5:13:37 PM By: Alric Quan Entered By: Alric Quan on 02/19/2018 09:52:13

## 2018-02-26 ENCOUNTER — Encounter: Payer: Medicare Other | Admitting: Internal Medicine

## 2018-02-26 DIAGNOSIS — I10 Essential (primary) hypertension: Secondary | ICD-10-CM | POA: Diagnosis not present

## 2018-02-26 DIAGNOSIS — L97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d: Secondary | ICD-10-CM | POA: Diagnosis not present

## 2018-02-26 DIAGNOSIS — I87332 Chronic venous hypertension (idiopathic) with ulcer and inflammation of left lower extremity: Secondary | ICD-10-CM | POA: Diagnosis not present

## 2018-02-26 DIAGNOSIS — G4730 Sleep apnea, unspecified: Secondary | ICD-10-CM | POA: Diagnosis not present

## 2018-02-26 DIAGNOSIS — I890 Lymphedema, not elsewhere classified: Secondary | ICD-10-CM | POA: Diagnosis not present

## 2018-02-26 DIAGNOSIS — I87312 Chronic venous hypertension (idiopathic) with ulcer of left lower extremity: Secondary | ICD-10-CM | POA: Diagnosis not present

## 2018-02-26 DIAGNOSIS — L97229 Non-pressure chronic ulcer of left calf with unspecified severity: Secondary | ICD-10-CM | POA: Diagnosis not present

## 2018-02-26 DIAGNOSIS — Z86718 Personal history of other venous thrombosis and embolism: Secondary | ICD-10-CM | POA: Diagnosis not present

## 2018-03-02 NOTE — Progress Notes (Signed)
[name], [name] ([id number]) Visit Report for 02/26/2018 HPI Details Patient Name: [name], [name]. Date of Service: 02/26/2018 11:00 AM Medical Record Number: [id number] Patient Account Number: [id number] Date of Birth/Sex: [birth year] (72 y.o. F) Treating RN: Cornell Barman Primary Care Provider: [name] Other Clinician: Referring Provider: [name] Treating Provider/Extender: Tito Dine in Treatment: 1 History of Present Illness HPI Description: Pleasant 72 year old with history of chronic venous insufficiency. No diabetes or peripheral vascular disease. Left ABI 1.29. Questionable history of left lower extremity DVT. She developed a recurrent ulceration on her left lateral calf in December 2015, which she attributes to poor diet and subsequent lower extremity edema. She underwent endovenous laser ablation of her left greater saphenous vein in 2010. She underwent laser ablation of accessory branch of left GSV in [name] 2016 by Dr. Kellie Simmering at [facility]. She was previously wearing Unna boots, which she tolerated well. Tolerating 2 layer compression and cadexomer iodine. She returns to clinic for follow-up and is without new complaints. She denies any significant pain at this time. She reports persistent pain with pressure. No claudication or ischemic rest pain. No fever or chills. No drainage. READMISSION 11/13/16; this is a 72 year old woman who is not a diabetic. She is here for a review of a painful area on her left medial lower extremity. I note that she was seen here previously last year for wound I believe to be in the same area. At that time she had undergone previously a left greater saphenous vein ablation by Dr. Kellie Simmering and she had a ablation of the anterior accessory branch of the left greater saphenous vein in March 2016. Seeing that the wound actually closed over. In reviewing the history with her today the ulcer in this area has been recurrent. She  describes a biopsy of this area in 2009 that only showed stasis physiology. 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 Do not see that she has ever had arterial studies in the left leg. She tells me she has a follow-up with Dr. Kellie Simmering in roughly 10 days In any case she developed the reopening of this area roughly a month ago. On the background of this she describes rapidly increasing edema which has responded to Lasix 40 mg and metolazone 2.5 mg as well as the patient's lymph massage. She has been told she has both venous insufficiency and lymphedema but she cannot tolerate compression stockings 11/28/16; the patient saw Dr. Kellie Simmering recently. Per the patient he did arterial Dopplers in the office that did not show evidence of arterial insufficiency, per the patient he stated "treat this like an ordinary venous ulcer". She also saw her dermatologist Dr. Ronnald Ramp who felt that this was more of a vascular ulcer. In general things are improving although she arrives today with increasing bilateral lower extremity edema with weeping a deeper fluid through the wound on the left medial leg compatible with some degree of lymphedema 12/04/16; the patient's wound is fully epithelialized but I don't think fully healed. We will do another week of depression with Promogran and TCA however I suspect we'll be able to discharge her next week. This is a very unusual-looking wound which was initially a figure-of-eight type wound lying on its side surrounded by petechial like hemorrhage. She  has had venous ablation on this side. She apparently does not have an arterial issue per Dr. Kellie Simmering. She saw her dermatologist thought it was "vascular". Patient is definitely going to need ongoing compression and I  talked about this with her today she will go to elastic therapy after she leaves here next week 12/11/16; the patient's wound is not completely closed today. She has surrounding scar tissue and in further discussion with the patient it would appear that she had ulcers in this area in 2009 for a prolonged period of time ultimately requiring a punch biopsy of this area that only showed venous insufficiency. I did not previously pickup on this part of the history from the patient. 12/18/16; the patient's wound is completely epithelialized. There is no open area here. She has significant bilateral venous insufficiency with secondary lymphedema to a mild-to-moderate degree she does not have compression stockings.. She did not say anything to me when I was in the room, she told our intake nurse that she was still having pain in this area. This isn't unusual recurrent small open area. She is going to go to elastic therapy to obtain compression stockings. [name], [name] ([id number]) 12/25/16; the patient's wound is fully epithelialized. There is no open area here. The patient describes some continued episodic discomfort in this area medial left calf. However everything looks fine and healed here. She is been to elastic therapy and caught herself 15-20 mmHg stockings, they apparently were having trouble getting 20-30 mm stockings in her size 01/22/17; this is a patient we discharged from the clinic a month ago. She has a recurrent open wound on her medial left calf. She had 15 mm support stockings. I told her I thought she needed 20-30 mm compression stockings. She tells me that she has been ill with hospitalization secondary to asthma and is been found to have severe hypokalemia likely secondary to a combination of Lasix and metolazone. This morning she noted blistering and leaking fluid on the posterior part of her left leg. She called our intake nurse urgently and we was saw her this afternoon. She has not  had any real discomfort here. I don't know that she's been wearing any stockings on this leg for at least 2-3 days. ABIs in this clinic were 1.21 on the right and 1.3 on the left. She is previously seen vascular surgery who does not think that there is a peripheral arterial issue. 01/30/17; Patient arrives with no open wound on the left leg. She has been to elastic therapy and obtained 20-13mmhg below knee stockings and she has one on the right leg today. READMISSION 02/19/18; this Mcswain is a now 72 year old patient we've had in this clinic perhaps 3 times before. I had last looked at her from January 07 December 2016 with an area on the medial left leg. We discharged her on 12/25/16 however she had to be readmitted on 01/22/17 with a recurrence. I have in my notes that we discharged her on 20-30 mm stockings although she tells me she was only wearing support hose because she cannot get stockings on predominantly related to her cervical spine surgery/issues. She has had previous ablations done by vein and vascular in [place] including a great saphenous vein ablation on the left with an anterior accessory branch ablation I think both of these were in 2016. On one of the previous visit she had a biopsy noted 2009 that was negative. She is not felt to have an arterial issue. She is not  a diabetic. She does have a history of obstructive sleep apnea hypertension asthma as well as chronic venous insufficiency and lymphedema. On this occasion she noted 2 dry scaly patch on her left leg. She tried to put lotion on this it didn't really help. There were 2 open areas.the patient has been seeing her primary physician from 02/05/18 through 02/14/18. She had Unna boots applied. The superior wound now on the lateral left leg has closed but she's had one wound that remains open on the lateral left leg. This is not the same spot as we dealt with in 2018. ABIs in this clinic were 1.3 bilaterally 02/26/18; patient has a  small wound on the left lateral calf. Dimensions are down. She has chronic venous insufficiency and lymphedema. Electronic Signature(s) Signed: 02/26/2018 5:40:08 PM By: Linton Ham MD Entered By: Linton Ham on 02/26/2018 12:47:46 [name], [name] ([id number]) -------------------------------------------------------------------------------- Physical Exam Details Patient Name: [name], [name]. Date of Service: 02/26/2018 11:00 AM Medical Record Number: [id number] Patient Account Number: [id number] Date of Birth/Sex: 08-14-1946 (72 y.o. F) Treating RN: Cornell Barman Primary Care Provider: [name] Other Clinician: Referring Provider: [name] Treating Provider/Extender: [name] Dillon Weeks in Treatment: 1 Constitutional Sitting or standing Blood Pressure is within target range for patient.. Pulse regular and within target range for patient.Marland Kitchen Respirations regular, non-labored and within target range.. Temperature is normal and within the target range for the patient.Marland Kitchen appears in no distress. Respiratory Respiratory effort is easy and symmetric bilaterally. Rate is normal at rest and on room air.. Cardiovascular pedal pulses are palpable in both sides. Lymphatic none palpable in the left popliteal area. Integumentary (Hair, Skin) no primary skin issues. Psychiatric No evidence of depression, anxiety, or agitation. Calm, cooperative, and communicative. Appropriate interactions and affect.. Notes wound exam; the area questions on the left lateral leg. The wound is 40% smaller. Surface not particularly viable but I did not debride this today. We'll check dimensions next week. Electronic Signature(s) Signed: 02/26/2018 5:40:08 PM By: Linton Ham MD Entered By: Linton Ham on 02/26/2018 12:49:53 [name], [name] ([id number]) -------------------------------------------------------------------------------- Physician Orders Details Patient Name: PENNELOPE, BASQUE. Date of Service: 02/26/2018 11:00 AM Medical Record Number: [id number] Patient Account Number: [id number] Date of Birth/Sex: Jul 22, 1946 (72 y.o. F) Treating RN: Cornell Barman Primary Care Provider: [name] Other Clinician: Referring Provider: [name] Treating Provider/Extender: Tito Dine in Treatment: 1 Verbal / Phone Orders: No Diagnosis Coding Wound Cleansing Wound #4 Left,Lateral Lower Leg o Clean wound with Normal Saline. o May Shower, gently pat wound dry prior to applying new dressing. Anesthetic (add to Medication List) Wound #4 Left,Lateral Lower Leg o Topical Lidocaine 4% cream applied to wound bed prior to debridement (In Clinic Only). o Benzocaine Topical Anesthetic Spray applied to wound bed prior to debridement (In Clinic Only). Primary Wound Dressing Wound #4 Left,Lateral Lower Leg o Silver Collagen Secondary Dressing Wound #4 Left,Lateral Lower Leg o ABD pad Dressing Change Frequency Wound #4 Left,Lateral Lower Leg o Change dressing every week Follow-up Appointments Wound #4 Left,Lateral Lower Leg o Return Appointment in 1 week. o Nurse Visit as needed Edema Control Wound #4 Left,Lateral Lower Leg o 4-Layer Compression System - Left Lower Extremity Electronic Signature(s) Signed: 02/26/2018 12:27:24 PM By: [name], [name], RN, CWS, [name], [name] Signed: 02/26/2018 5:40:08 PM By: Linton Ham MD Entered By: [name], [name], RN, CWS, Kim on 02/26/2018 11:46:54 [place], Fleet [name] ([id number]) -------------------------------------------------------------------------------- Problem List Details Patient Name: REISHA, WOS. Date of Service: 02/26/2018  11:00 AM Medical Record Number: [id number] Patient Account Number: [id number] Date of Birth/Sex: 13-May-1946 (72 y.o. F) Treating RN: Cornell Barman Primary Care Provider: [name] Other Clinician: Referring Provider: [name] Treating Provider/Extender: Tito Dine in Treatment: 1 Active Problems ICD-10 Impacting Encounter Code Description Active Date Wound Healing Diagnosis L97.228 Non-pressure chronic ulcer of left calf with other specified 02/19/2018 Yes severity I89.0 Lymphedema, not elsewhere classified 02/19/2018 Yes I87.332 Chronic venous hypertension (idiopathic) with ulcer and 02/19/2018 Yes inflammation of left lower extremity Inactive Problems Resolved Problems Electronic Signature(s) Signed: 02/26/2018 5:40:08 PM By: Linton Ham MD Entered By: Linton Ham on 02/26/2018 12:43:35 [name], [name] ([id number]) -------------------------------------------------------------------------------- Progress Note Details Patient Name: [name], [name]. Date of Service: 02/26/2018 11:00 AM Medical Record Number: [id number] Patient Account Number: [id number] Date of Birth/Sex: 1946/06/24 (72 y.o. F) Treating RN: Cornell Barman Primary Care Provider: [name] Other Clinician: Referring Provider: [name] Treating Provider/Extender: Tito Dine in Treatment: 1 Subjective History of Present Illness (HPI) Pleasant 72 year old with history of chronic venous insufficiency. No diabetes or peripheral vascular disease. Left ABI 1.29. Questionable history of left lower extremity DVT. She developed a recurrent ulceration on her left lateral calf in December 2015, which she attributes to poor diet and subsequent lower extremity edema. She underwent endovenous laser ablation of her left greater saphenous vein in 2010. She underwent laser ablation of accessory branch of left GSV in [name] 2016 by Dr. Kellie Simmering at [facility]. She was previously wearing Unna boots, which she tolerated well. Tolerating 2 layer compression and cadexomer iodine. She returns to clinic for follow-up and is without new complaints. She denies any significant pain at this time. She reports persistent pain with pressure. No claudication or  ischemic rest pain. No fever or chills. No drainage. READMISSION 11/13/16; this is a 72 year old woman who is not a diabetic. She is here for a review of a painful area on her left medial lower extremity. I note that she was seen here previously last year for wound I believe to be in the same area. At that time she had undergone previously a left greater saphenous vein ablation by Dr. Kellie Simmering and she had a ablation of the anterior accessory branch of the left greater saphenous vein in March 2016. Seeing that the wound actually closed over. In reviewing the history with her today the ulcer in this area has been recurrent. She describes a biopsy of this area in 2009 that only showed stasis physiology. 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 Do not see that she has ever had arterial studies in the left leg. She tells me she has a follow-up with Dr. Kellie Simmering in roughly 10 days In any case she developed the reopening of this area roughly a month ago. On the background of this she describes rapidly increasing edema which has responded to Lasix 40 mg and metolazone 2.5 mg as well as the patient's lymph massage. She has been told she has both venous insufficiency and lymphedema but she cannot tolerate compression stockings 11/28/16; the patient saw Dr. Kellie Simmering recently. Per the patient he did arterial Dopplers in the office that did not show evidence of arterial insufficiency, per the patient he stated "treat this like an ordinary venous ulcer". She  also saw her dermatologist Dr. Ronnald Ramp who felt that this was more of a vascular ulcer. In general things are improving although she arrives today with increasing bilateral lower extremity edema with weeping a deeper fluid through the wound on the  left medial leg compatible with some degree of lymphedema 12/04/16; the patient's wound is fully epithelialized but I don't think fully healed. We will do another week of depression with Promogran and TCA however I suspect we'll be able to discharge her next week. This is a very unusual-looking wound which was initially a figure-of-eight type wound lying on its side surrounded by petechial like hemorrhage. She has had venous ablation on this side. She apparently does not have an arterial issue per Dr. Kellie Simmering. She saw her dermatologist thought it was "vascular". Patient is definitely going to need ongoing compression and I talked about this with her today she will go to elastic therapy after she leaves here next week 12/11/16; the patient's wound is not completely closed today. She has surrounding scar tissue and in further discussion with the patient it would appear that she had ulcers in this area in 2009 for a prolonged period of time ultimately requiring a punch biopsy of this area that only showed venous insufficiency. I did not previously pickup on this part of the history from the patient. 12/18/16; the patient's wound is completely epithelialized. There is no open area here. She has significant bilateral venous insufficiency with secondary lymphedema to a mild-to-moderate degree she does not have compression stockings.. She did not say anything to me when I was in the room, she told our intake nurse that she was still having pain in this area. This isn't unusual recurrent small open area. She is going to go to elastic therapy to obtain compression stockings. 12/25/16; the patient's wound is fully epithelialized. There is no open area here. The patient describes some continued episodic discomfort in this area medial left calf. However everything looks fine and healed here. She is been to elastic therapy and [name], [name] ([id number]) caught herself 15-20 mmHg stockings, they apparently were  having trouble getting 20-30 mm stockings in her size 01/22/17; this is a patient we discharged from the clinic a month ago. She has a recurrent open wound on her medial left calf. She had 15 mm support stockings. I told her I thought she needed 20-30 mm compression stockings. She tells me that she has been ill with hospitalization secondary to asthma and is been found to have severe hypokalemia likely secondary to a combination of Lasix and metolazone. This morning she noted blistering and leaking fluid on the posterior part of her left leg. She called our intake nurse urgently and we was saw her this afternoon. She has not had any real discomfort here. I don't know that she's been wearing any stockings on this leg for at least 2-3 days. ABIs in this clinic were 1.21 on the right and 1.3 on the left. She is previously seen vascular surgery who does not think that there is a peripheral arterial issue. 01/30/17; Patient arrives with no open wound on the left leg. She has been to elastic therapy and obtained 20-1mmhg below knee stockings and she has one on the right leg today. READMISSION 02/19/18; this Lingle is a now 72 year old patient we've had in this clinic perhaps 3 times before. I had last looked at her from January 07 December 2016 with an area on the medial left leg. We discharged her on 12/25/16 however  she had to be readmitted on 01/22/17 with a recurrence. I have in my notes that we discharged her on 20-30 mm stockings although she tells me she was only wearing support hose because she cannot get stockings on predominantly related to her cervical spine surgery/issues. She has had previous ablations done by vein and vascular in [place] including a great saphenous vein ablation on the left with an anterior accessory branch ablation I think both of these were in 2016. On one of the previous visit she had a biopsy noted 2009 that was negative. She is not felt to have an arterial issue. She is  not a diabetic. She does have a history of obstructive sleep apnea hypertension asthma as well as chronic venous insufficiency and lymphedema. On this occasion she noted 2 dry scaly patch on her left leg. She tried to put lotion on this it didn't really help. There were 2 open areas.the patient has been seeing her primary physician from 02/05/18 through 02/14/18. She had Unna boots applied. The superior wound now on the lateral left leg has closed but she's had one wound that remains open on the lateral left leg. This is not the same spot as we dealt with in 2018. ABIs in this clinic were 1.3 bilaterally 02/26/18; patient has a small wound on the left lateral calf. Dimensions are down. She has chronic venous insufficiency and lymphedema. Objective Constitutional Sitting or standing Blood Pressure is within target range for patient.. Pulse regular and within target range for patient.Marland Kitchen Respirations regular, non-labored and within target range.. Temperature is normal and within the target range for the patient.Marland Kitchen appears in no distress. Vitals Time Taken: 11:10 AM, Height: 63 in, Weight: 219.9 lbs, BMI: 38.9, Temperature: 98.1 F, Pulse: 61 bpm, Respiratory Rate: 18 breaths/min, Blood Pressure: 125/66 mmHg. Respiratory Respiratory effort is easy and symmetric bilaterally. Rate is normal at rest and on room air.. Cardiovascular pedal pulses are palpable in both sides. Lymphatic none palpable in the left popliteal area. Psychiatric [name], [name] ([id number]) No evidence of depression, anxiety, or agitation. Calm, cooperative, and communicative. Appropriate interactions and affect.. General Notes: wound exam; the area questions on the left lateral leg. The wound is 40% smaller. Surface not particularly viable but I did not debride this today. We'll check dimensions next week. Integumentary (Hair, Skin) no primary skin issues. Wound #4 status is Open. Original cause of wound was Gradually  Appeared. The wound is located on the Left,Lateral Lower Leg. The wound measures 0.5cm length x 0.5cm width x 0.1cm depth; 0.196cm^2 area and 0.02cm^3 volume. There is no tunneling or undermining noted. There is a medium amount of serosanguineous drainage noted. The wound margin is flat and intact. There is no granulation within the wound bed. There is a large (67-100%) amount of necrotic tissue within the wound bed including Adherent Slough. Periwound temperature was noted as No Abnormality. The periwound has tenderness on palpation. Assessment Active Problems ICD-10 L97.228 - Non-pressure chronic ulcer of left calf with other specified severity I89.0 - Lymphedema, not elsewhere classified I87.332 - Chronic venous hypertension (idiopathic) with ulcer and inflammation of left lower extremity Procedures Wound #4 Pre-procedure diagnosis of Wound #4 is a Venous Leg Ulcer located on the Left,Lateral Lower Leg . There was a Four Layer Compression Therapy Procedure with a pre-treatment ABI of 1.3 by Cornell Barman, RN. Post procedure Diagnosis Wound #4: Same as Pre-Procedure Plan Wound Cleansing: Wound #4 Left,Lateral Lower Leg: Clean wound with Normal Saline. May Shower, gently pat wound  dry prior to applying new dressing. Anesthetic (add to Medication List): Wound #4 Left,Lateral Lower Leg: Topical Lidocaine 4% cream applied to wound bed prior to debridement (In Clinic Only). Benzocaine Topical Anesthetic Spray applied to wound bed prior to debridement (In Clinic Only). Primary Wound Dressing: Wound #4 Left,Lateral Lower Leg: Silver Collagen [name], [name] ([id number]) Secondary Dressing: Wound #4 Left,Lateral Lower Leg: ABD pad Dressing Change Frequency: Wound #4 Left,Lateral Lower Leg: Change dressing every week Follow-up Appointments: Wound #4 Left,Lateral Lower Leg: Return Appointment in 1 week. Nurse Visit as needed Edema Control: Wound #4 Left,Lateral Lower Leg: 4-Layer  Compression System - Left Lower Extremity #1he continued with the Silver collagen/ABDs/4 L compression which she is tolerating quite well. #2 we will need to see about stockings. She cannot apply standard over the toes stockings even with the sock on her. She is allergic to the inner layer of trucks the legs. Consider extremitease or Curator) Signed: 02/26/2018 5:40:08 PM By: Linton Ham MD Entered By: Linton Ham on 02/26/2018 12:51:24 [name], [name] ([id number]) -------------------------------------------------------------------------------- SuperBill Details Patient Name: [name], BIESER. Date of Service: 02/26/2018 Medical Record Number: [id number] Patient Account Number: [id number] Date of Birth/Sex: [birth year] (72 y.o. F) Treating RN: Cornell Barman Primary Care Provider: [name] Other Clinician: Referring Provider: [name] Treating Provider/Extender: Tito Dine in Treatment: 1 Diagnosis Coding ICD-10 Codes Code Description [id number] Non-pressure chronic ulcer of left calf with other specified severity I89.0 Lymphedema, not elsewhere classified I87.332 Chronic venous hypertension (idiopathic) with ulcer and inflammation of left lower extremity Facility Procedures CPT4 Code: [id number] Description: (Facility Use Only) [phone] - [facility] LT LEG Modifier: Quantity: 1 Physician Procedures CPT4 Code: 4709628 Description: 36629 - WC PHYS LEVEL 3 - EST PT ICD-10 Diagnosis Description L97.228 Non-pressure chronic ulcer of left calf with other specified I89.0 Lymphedema, not elsewhere classified Modifier: severity Quantity: 1 Electronic Signature(s) Signed: 02/26/2018 5:40:08 PM By: Linton Ham MD Previous Signature: 02/26/2018 12:27:24 PM Version By: [name], [name], RN, CWS, [name], [name] Entered By: Linton Ham on 02/26/2018 12:51:55

## 2018-03-04 DIAGNOSIS — H25812 Combined forms of age-related cataract, left eye: Secondary | ICD-10-CM | POA: Diagnosis not present

## 2018-03-04 DIAGNOSIS — H268 Other specified cataract: Secondary | ICD-10-CM | POA: Diagnosis not present

## 2018-03-04 NOTE — Progress Notes (Signed)
[name], [name] ([id number]) Visit Report for 02/26/2018 Arrival Information Details Patient Name: [name], [name]. Date of Service: 02/26/2018 11:00 AM Medical Record Number: [id number] Patient Account Number: [id number] Date of Birth/Sex: 1945/11/27 (72 y.o. F) Treating RN: Ahmed Prima Primary Care Sheridan Hew: [name] Other Clinician: Referring Eda Magnussen: [name] Treating Wessley Emert/Extender: Tito Dine in Treatment: 1 Visit Information History Since Last Visit Added or deleted any medications: No Patient Arrived: Ambulatory Any new allergies or adverse reactions: No Arrival Time: 11:07 Had a fall or experienced change in No Accompanied By: self activities of daily living that may affect Transfer Assistance: None risk of falls: Patient Identification Verified: Yes Signs or symptoms of abuse/neglect since last visito No Secondary Verification Process Completed: Yes Hospitalized since last visit: No Patient Requires Transmission-Based No Implantable device outside of the clinic excluding No Precautions: cellular tissue based products placed in the center Patient Has Alerts: No since last visit: Has Dressing in Place as Prescribed: Yes Has Compression in Place as Prescribed: Yes Pain Present Now: No Electronic Signature(s) Signed: 02/28/2018 4:29:16 PM By: Alric Quan Entered By: Alric Quan on 02/26/2018 11:10:13 Wipperfurth, [name] ([id number]) -------------------------------------------------------------------------------- Compression Therapy Details Patient Name: [name], [name]. Date of Service: 02/26/2018 11:00 AM Medical Record Number: [id number] Patient Account Number: [id number] Date of Birth/Sex: 04-30-1946 (72 y.o. F) Treating RN: Cornell Barman Primary Care [name]: [name] Other Clinician: Referring Nile Dorning: [name] Treating Rowe Warman/Extender: Tito Dine in Treatment: 1 Compression Therapy  Performed for Wound Assessment: Wound #4 Left,Lateral Lower Leg Performed By: Clinician Cornell Barman, RN Compression Type: Four Layer Pre Treatment ABI: 1.3 Post Procedure Diagnosis Same as Pre-procedure Electronic Signature(s) Signed: 02/26/2018 12:27:24 PM By: [name], [name], RN, CWS, [name], [name] Entered By: [name], [name], RN, CWS, Kim on 02/26/2018 11:46:27 Caba, [name] ([id number]) -------------------------------------------------------------------------------- Encounter Discharge Information Details Patient Name: [name], [name]. Date of Service: 02/26/2018 11:00 AM Medical Record Number: [id number] Patient Account Number: [id number] Date of Birth/Sex: [birth year] (72 y.o. F) Treating RN: Cornell Barman Primary Care Jakyiah Briones: [name] Other Clinician: Referring Amneet Cendejas: [name] Treating Jenesis Suchy/Extender: Tito Dine in Treatment: 1 Encounter Discharge Information Items Discharge Pain Level: 0 Discharge Condition: Stable Ambulatory Status: Ambulatory Discharge Destination: Home Transportation: Private Auto Accompanied By: self Schedule Follow-up Appointment: Yes Medication Reconciliation completed and No provided to Patient/Care Shaneta Cervenka: Provided on Clinical Summary of Care: 02/26/2018 Form Type Recipient Paper Patient LP Electronic Signature(s) Signed: 02/26/2018 12:54:20 PM By: Montey Hora Entered By: Montey Hora on 02/26/2018 12:54:20 Duchesne, [name] ([id number]) -------------------------------------------------------------------------------- Lower Extremity Assessment Details Patient Name: [name], [name]. Date of Service: 02/26/2018 11:00 AM Medical Record Number: [id number] Patient Account Number: [id number] Date of Birth/Sex: 1946-04-22 (72 y.o. F) Treating RN: Ahmed Prima Primary Care Robson Trickey: [name] Other Clinician: Referring Veronica [name]: [name] Treating Destenee Guerry/Extender: Tito Dine in Treatment:  1 Edema Assessment Assessed: [Left: No] [Right: No] Edema: [Left: Ye] [Right: s] Calf Left: Right: Point of Measurement: 32 cm From Medial Instep 39.3 cm cm Ankle Left: Right: Point of Measurement: 12 cm From Medial Instep 22.5 cm cm Vascular Assessment Pulses: Dorsalis Pedis Palpable: [Left:Yes] Posterior Tibial Extremity colors, hair growth, and conditions: Extremity Color: [Left:Mottled] Hair Growth on Extremity: [Left:Yes] Temperature of Extremity: [Left:Warm] Capillary Refill: [Left:< 3 seconds] Toe Nail Assessment Left: Right: Thick: Yes Discolored: Yes Deformed: No Improper Length and Hygiene: Yes Electronic Signature(s) Signed: 02/28/2018 4:29:16 PM By: Alric Quan Entered By: Alric Quan on 02/26/2018 11:19:51 Storrs, Eleora J. (  [id number]) -------------------------------------------------------------------------------- Multi Wound Chart Details Patient Name: [name], [name]. Date of Service: 02/26/2018 11:00 AM Medical Record Number: [id number] Patient Account Number: [id number] Date of Birth/Sex: 1946-07-22 (72 y.o. F) Treating RN: Cornell Barman Primary Care Abdiaziz Klahn: [name] Other Clinician: Referring Ruthanne Mcneish: [name] Treating Necia Kamm/Extender: Tito Dine in Treatment: 1 Vital Signs Height(in): 63 Pulse(bpm): 61 Weight(lbs): 219.9 Blood Pressure(mmHg): 125/66 Body Mass Index(BMI): 39 Temperature(F): 98.1 Respiratory Rate 18 (breaths/min): Photos: [4:No Photos] [N/A:N/A] Wound Location: [4:Left Lower Leg - Lateral] [N/A:N/A] Wounding Event: [4:Gradually Appeared] [N/A:N/A] Primary Etiology: [4:Venous Leg Ulcer] [N/A:N/A] Comorbid History: [4:Cataracts, Asthma, Sleep Apnea, Deep Vein Thrombosis, Hypertension, Peripheral Venous Disease, Osteoarthritis, Received Chemotherapy, Received Radiation] [N/A:N/A] Date Acquired: [4:02/05/2018] [N/A:N/A] Weeks of Treatment: [4:1] [N/A:N/A] Wound Status: [4:Open]  [N/A:N/A] Measurements L x W x D [4:0.5x0.5x0.1] [N/A:N/A] (cm) Area (cm) : [4:0.196] [N/A:N/A] Volume (cm) : [4:0.02] [N/A:N/A] % Reduction in Area: [4:65.30%] [N/A:N/A] % Reduction in Volume: [4:64.90%] [N/A:N/A] Classification: [4:Full Thickness Without Exposed Support Structures] [N/A:N/A] Exudate Amount: [4:Medium] [N/A:N/A] Exudate Type: [4:Serosanguineous] [N/A:N/A] Exudate Color: [4:red, brown] [N/A:N/A] Wound Margin: [4:Flat and Intact] [N/A:N/A] Granulation Amount: [4:None Present (0%)] [N/A:N/A] Necrotic Amount: [4:Large (67-100%)] [N/A:N/A] Exposed Structures: [4:Fascia: No Fat Layer (Subcutaneous Tissue) Exposed: No Tendon: No Muscle: No Joint: No Bone: No] [N/A:N/A] Epithelialization: [4:None] [N/A:N/A] Periwound Skin Texture: [4:No Abnormalities Noted] [N/A:N/A] Periwound Skin Moisture: No Abnormalities Noted N/A N/A Periwound Skin Color: No Abnormalities Noted N/A N/A Temperature: No Abnormality N/A N/A Tenderness on Palpation: Yes N/A N/A Wound Preparation: Ulcer Cleansing: N/A N/A Rinsed/Irrigated with Saline Topical Anesthetic Applied: Other: lidocaine 4% Procedures Performed: Compression Therapy N/A N/A Treatment Notes Electronic Signature(s) Signed: 02/26/2018 5:40:08 PM By: Linton Ham MD Previous Signature: 02/26/2018 12:27:24 PM Version By: [name], [name], RN, CWS, [name], [name] Entered By: Linton Ham on 02/26/2018 12:43:58 Fendrick, [name] ([id number]) -------------------------------------------------------------------------------- Multi-Disciplinary Care Plan Details Patient Name: [name], [name]. Date of Service: 02/26/2018 11:00 AM Medical Record Number: [id number] Patient Account Number: [id number] Date of Birth/Sex: April 17, 1946 (72 y.o. F) Treating RN: Cornell Barman Primary Care Makayleigh Poliquin: [name] Other Clinician: Referring Jakelin Taussig: [name] Treating Wandell Scullion/Extender: Tito Dine in Treatment: 1 Active  Inactive ` Orientation to the Wound Care Program Nursing Diagnoses: Knowledge deficit related to the wound healing center program Goals: Patient/caregiver will verbalize understanding of the [facility] Date Initiated: 02/19/2018 Target Resolution Date: 03/21/2018 Goal Status: Active Interventions: Provide education on orientation to the wound center Notes: ` Venous Leg Ulcer Nursing Diagnoses: Actual venous Insuffiency (use after diagnosis is confirmed) Goals: Patient will maintain optimal edema control Date Initiated: 02/19/2018 Target Resolution Date: 03/21/2018 Goal Status: Active Interventions: Compression as ordered Treatment Activities: Therapeutic compression applied : 02/19/2018 Notes: ` Wound/Skin Impairment Nursing Diagnoses: Impaired tissue integrity Goals: Ulcer/skin breakdown will have a volume reduction of 80% by week 12 Date Initiated: 02/19/2018 Target Resolution Date: 06/21/2018 [name], [name] ([id number]) Goal Status: Active Interventions: Assess ulceration(s) every visit Treatment Activities: Topical wound management initiated : 02/19/2018 Notes: Electronic Signature(s) Signed: 02/26/2018 12:27:24 PM By: [name], [name], RN, CWS, [name], [name] Entered By: [name], [name], RN, CWS, Kim on 02/26/2018 11:44:59 [name], [name] ([id number]) -------------------------------------------------------------------------------- Pain Assessment Details Patient Name: [name], [name]. Date of Service: 02/26/2018 11:00 AM Medical Record Number: [id number] Patient Account Number: [id number] Date of Birth/Sex: Dec 21, 1945 (72 y.o. F) Treating RN: Ahmed Prima Primary Care Cass Edinger: [name] Other Clinician: Referring Jahira Swiss: [name] Treating Rada Zegers/Extender: Tito Dine in Treatment: 1 Active Problems Location  of Pain Severity and Description of Pain Patient Has Paino No Site Locations Pain Management and Medication Current  Pain Management: Electronic Signature(s) Signed: 02/28/2018 4:29:16 PM By: Alric Quan Entered By: Alric Quan on 02/26/2018 11:10:19 Yordy, [name] ([id number]) -------------------------------------------------------------------------------- Patient/Caregiver Education Details Patient Name: [name], [name]. Date of Service: 02/26/2018 11:00 AM Medical Record Number: [id number] Patient Account Number: [id number] Date of Birth/Gender: May 06, 1946 (72 y.o. F) Treating RN: Montey Hora Primary Care Physician: [name] Other Clinician: Referring Physician: [name] Treating Physician/Extender: Tito Dine in Treatment: 1 Education Assessment Education Provided To: Patient Education Topics Provided Venous: Handouts: Other: need for ongoing compression therapy Methods: Explain/Verbal Responses: State content correctly Electronic Signature(s) Signed: 02/26/2018 5:00:43 PM By: Montey Hora Entered By: Montey Hora on 02/26/2018 12:54:40 Civello, [name] ([id number]) -------------------------------------------------------------------------------- Wound Assessment Details Patient Name: [name], [name]. Date of Service: 02/26/2018 11:00 AM Medical Record Number: [id number] Patient Account Number: [id number] Date of Birth/Sex: [birth year] (72 y.o. F) Treating RN: Ahmed Prima Primary Care Kishon Garriga: [name] Other Clinician: Referring Anastacia Reinecke: [name] Treating Blayklee Mable/Extender: [name] Dillon Weeks in Treatment: 1 Wound Status Wound Number: 4 Primary Venous Leg Ulcer Etiology: Wound Location: Left Lower Leg - Lateral Wound Open Wounding Event: Gradually Appeared Status: Date Acquired: 02/05/2018 Comorbid Cataracts, Asthma, Sleep Apnea, Deep Vein Weeks Of Treatment: 1 History: Thrombosis, Hypertension, Peripheral Venous Clustered Wound: No Disease, Osteoarthritis, Received Chemotherapy, Received Radiation Photos Photo  Uploaded By: Roger Shelter on 02/26/2018 17:07:21 Wound Measurements Length: (cm) 0.5 Width: (cm) 0.5 Depth: (cm) 0.1 Area: (cm) 0.196 Volume: (cm) 0.02 % Reduction in Area: 65.3% % Reduction in Volume: 64.9% Epithelialization: None Tunneling: No Undermining: No Wound Description Full Thickness Without Exposed Support Classification: Structures Wound Margin: Flat and Intact Exudate Medium Amount: Exudate Type: Serosanguineous Exudate Color: red, brown Foul Odor After Cleansing: No Slough/Fibrino Yes Wound Bed Granulation Amount: None Present (0%) Exposed Structure Necrotic Amount: Large (67-100%) Fascia Exposed: No Necrotic Quality: Adherent Slough Fat Layer (Subcutaneous Tissue) Exposed: No Tendon Exposed: No Muscle Exposed: No Joint Exposed: No [name], POEHLMAN. ([id number]) Bone Exposed: No Periwound Skin Texture Texture Color No Abnormalities Noted: No No Abnormalities Noted: No Moisture Temperature / Pain No Abnormalities Noted: No Temperature: No Abnormality Tenderness on Palpation: Yes Wound Preparation Ulcer Cleansing: Rinsed/Irrigated with Saline Topical Anesthetic Applied: Other: lidocaine 4%, Treatment Notes Wound #4 (Left, Lateral Lower Leg) 1. Cleansed with: Cleanse wound with antibacterial soap and water 2. Anesthetic Topical Lidocaine 4% cream to wound bed prior to debridement 4. Dressing Applied: Prisma Ag 5. Secondary Dressing Applied ABD Pad 7. Secured with 4-Layer Compression System - Right Lower Extremity Notes unna to anchor Electronic Signature(s) Signed: 02/28/2018 4:29:16 PM By: Alric Quan Entered By: Alric Quan on 02/26/2018 11:17:53 Justo, [name] ([id number]) -------------------------------------------------------------------------------- Vitals Details Patient Name: JAELANI, POSA. Date of Service: 02/26/2018 11:00 AM Medical Record Number: [id number] Patient Account Number: [id number] Date of Birth/Sex:  [birth year] (72 y.o. F) Treating RN: Ahmed Prima Primary Care Virgen Belland: [name] Other Clinician: Referring Flonnie Wierman: [name] Treating Bradi Arbuthnot/Extender: Tito Dine in Treatment: 1 Vital Signs Time Taken: 11:10 Temperature (F): 98.1 Height (in): 63 Pulse (bpm): 61 Weight (lbs): 219.9 Respiratory Rate (breaths/min): 18 Body Mass Index (BMI): 38.9 Blood Pressure (mmHg): 125/66 Reference Range: 80 - 120 mg / dl Electronic Signature(s) Signed: 02/28/2018 4:29:16 PM By: Alric Quan Entered By: Alric Quan on 02/26/2018 11:12:04

## 2018-03-05 ENCOUNTER — Encounter: Payer: Medicare Other | Attending: Internal Medicine | Admitting: Internal Medicine

## 2018-03-05 DIAGNOSIS — G4733 Obstructive sleep apnea (adult) (pediatric): Secondary | ICD-10-CM | POA: Insufficient documentation

## 2018-03-05 DIAGNOSIS — I87332 Chronic venous hypertension (idiopathic) with ulcer and inflammation of left lower extremity: Secondary | ICD-10-CM | POA: Insufficient documentation

## 2018-03-05 DIAGNOSIS — L97229 Non-pressure chronic ulcer of left calf with unspecified severity: Secondary | ICD-10-CM | POA: Insufficient documentation

## 2018-03-05 DIAGNOSIS — L97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d: Secondary | ICD-10-CM | POA: Diagnosis not present

## 2018-03-05 DIAGNOSIS — I87312 Chronic venous hypertension (idiopathic) with ulcer of left lower extremity: Secondary | ICD-10-CM | POA: Diagnosis not present

## 2018-03-05 DIAGNOSIS — I890 Lymphedema, not elsewhere classified: Secondary | ICD-10-CM | POA: Insufficient documentation

## 2018-03-05 DIAGNOSIS — J45909 Unspecified asthma, uncomplicated: Secondary | ICD-10-CM | POA: Diagnosis not present

## 2018-03-05 DIAGNOSIS — I10 Essential (primary) hypertension: Secondary | ICD-10-CM | POA: Insufficient documentation

## 2018-03-05 DIAGNOSIS — L97222 Non-pressure chronic ulcer of left calf with fat layer exposed: Secondary | ICD-10-CM | POA: Diagnosis not present

## 2018-03-08 NOTE — Progress Notes (Signed)
ISSABELLA, RIX ([id number]) Visit Report for 03/05/2018 Debridement Details Patient Name: [name], [name]. Date of Service: 03/05/2018 12:30 PM Medical Record Number: [id number] Patient Account Number: [id number] Date of Birth/Sex: [birth year] (72 y.o. F) Treating RN: Montey Hora Primary Care Provider: [name] Other Clinician: Referring Provider: [name] Treating Provider/Extender: Tito Dine in Treatment: 2 Debridement Performed for Wound #4 Left,Lateral Lower Leg Assessment: Performed By: Physician [name] Dillon, MD Debridement Type: Debridement Severity of Tissue Pre Fat layer exposed Debridement: Pre-procedure Verification/Time Yes - 13:06 Out Taken: Start Time: 13:06 Pain Control: Lidocaine 4% Topical Solution Total Area Debrided (L x W): 0.5 (cm) x 0.5 (cm) = 0.25 (cm) Tissue and other material Viable, Non-Viable, Slough, Subcutaneous, Fibrin/Exudate, Slough debrided: Level: Skin/Subcutaneous Tissue Debridement Description: Excisional Instrument: Curette Bleeding: Minimum Hemostasis Achieved: Pressure End Time: 13:09 Procedural Pain: 0 Post Procedural Pain: 0 Response to Treatment: Procedure was tolerated well Post Debridement Measurements of Total Wound Length: (cm) 0.8 Width: (cm) 0.8 Depth: (cm) 0.1 Volume: (cm) 0.05 Character of Wound/Ulcer Post Debridement: Improved Severity of Tissue Post Debridement: Fat layer exposed Post Procedure Diagnosis Same as Pre-procedure Electronic Signature(s) Signed: 03/05/2018 4:28:35 PM By: Linton Ham MD Signed: 03/05/2018 4:48:19 PM By: Montey Hora Entered By: Linton Ham on 03/05/2018 13:20:05 [name] ([id number]) -------------------------------------------------------------------------------- HPI Details Patient Name: [name], [name]. Date of Service: 03/05/2018 12:30 PM Medical Record Number: [id number] Patient Account Number: [id number] Date of Birth/Sex:  1946-07-03 (72 y.o. F) Treating RN: Montey Hora Primary Care Provider: [name] Other Clinician: Referring Provider: [name] Treating Provider/Extender: Tito Dine in Treatment: 2 History of Present Illness HPI Description: Pleasant 72 year old with history of chronic venous insufficiency. No diabetes or peripheral vascular disease. Left ABI 1.29. Questionable history of left lower extremity DVT. She developed a recurrent ulceration on her left lateral calf in December 2015, which she attributes to poor diet and subsequent lower extremity edema. She underwent endovenous laser ablation of her left greater saphenous vein in 2010. She underwent laser ablation of accessory branch of left GSV in April 2016 by Dr. Kellie Simmering at [facility]. She was previously wearing Unna boots, which she tolerated well. Tolerating 2 layer compression and cadexomer iodine. She returns to clinic for follow-up and is without new complaints. She denies any significant pain at this time. She reports persistent pain with pressure. No claudication or ischemic rest pain. No fever or chills. No drainage. READMISSION 11/13/16; this is a 72 year old woman who is not a diabetic. She is here for a review of a painful area on her left medial lower extremity. I note that she was seen here previously last year for wound I believe to be in the same area. At that time she had undergone previously a left greater saphenous vein ablation by Dr. Kellie Simmering and she had a ablation of the anterior accessory branch of the left greater saphenous vein in March 2016. Seeing that the wound actually closed over. In reviewing the history with her today the ulcer in this area has been recurrent. She describes a biopsy of this area in 2009 that only showed stasis physiology. 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 Do not see that she has ever had arterial studies in the left leg. She  tells me she has a follow-up with Dr. Kellie Simmering in roughly 10 days In any case she developed the reopening of this area roughly a month ago. On the background of this she describes rapidly increasing edema which has responded to Lasix 40 mg and metolazone 2.5 mg as well as the patient's lymph massage. She has been told she has both venous insufficiency and lymphedema but she cannot tolerate compression stockings 11/28/16; the patient saw Dr. Kellie Simmering recently. Per the patient he did arterial Dopplers in the office that did not show evidence of arterial insufficiency, per the patient he stated "treat this like an ordinary venous ulcer". She also saw her dermatologist Dr. Ronnald Ramp who felt that this was more of a vascular ulcer. In general things are improving although she arrives today with increasing bilateral lower extremity edema with weeping a deeper fluid through the wound on the left medial leg compatible with some degree of lymphedema 12/04/16; the patient's wound is fully epithelialized but I don't think fully healed. We will do another week of depression with Promogran and TCA however I suspect we'll be able to discharge her next week. This is a very unusual-looking wound which was initially a figure-of-eight type wound lying on its side surrounded by petechial like hemorrhage. She has had venous ablation on this side. She apparently does not have an arterial issue per Dr. Kellie Simmering. She saw her dermatologist thought it was "vascular". Patient is definitely going to need ongoing compression and I talked about this with her today she will go to elastic therapy after she leaves here next week 12/11/16; the patient's wound is not completely closed today. She has surrounding scar tissue and in further discussion with the patient it  would appear that she had ulcers in this area in 2009 for a prolonged period of time ultimately requiring a punch biopsy of this area that only showed venous insufficiency. I did not previously pickup on this part of the history from the patient. 12/18/16; the patient's wound is completely epithelialized. There is no open area here. She has significant bilateral venous insufficiency with secondary lymphedema to a mild-to-moderate degree she does not have compression stockings.. She did not say anything to me when I was in the room, she told our intake nurse that she was still having pain in this area. This isn't unusual recurrent small open area. She is going to go to elastic therapy to obtain compression stockings. 12/25/16; the patient's wound is fully epithelialized. There is no open area here. The patient describes some continued episodic discomfort in this area medial left calf. However everything looks fine and healed here. She is been to elastic therapy and caught herself 15-20 mmHg stockings, they apparently were having trouble getting 20-30 mm stockings in her size [name], [name] ([id number]) 01/22/17; this is a patient we discharged from the clinic a month ago. She has a recurrent open wound on her medial left calf. She had 15 mm support stockings. I told her I thought she needed 20-30 mm compression stockings. She tells me that she has been ill with hospitalization secondary to asthma and is been found to have severe hypokalemia likely secondary to a combination of Lasix and metolazone. This morning she noted blistering and leaking fluid on the posterior part of her left leg. She called our intake nurse urgently and we was saw her this afternoon. She has not had any real discomfort here. I don't know that she's been wearing any stockings on this leg for at  least 2-3 days. ABIs in this clinic were 1.21 on the right and 1.3 on the left. She is previously seen vascular surgery who does not  think that there is a peripheral arterial issue. 01/30/17; Patient arrives with no open wound on the left leg. She has been to elastic therapy and obtained 20-68mmhg below knee stockings and she has one on the right leg today. READMISSION 02/19/18; this Carrasco is a now 72 year old patient we've had in this clinic perhaps 3 times before. I had last looked at her from January 07 December 2016 with an area on the medial left leg. We discharged her on 12/25/16 however she had to be readmitted on 01/22/17 with a recurrence. I have in my notes that we discharged her on 20-30 mm stockings although she tells me she was only wearing support hose because she cannot get stockings on predominantly related to her cervical spine surgery/issues. She has had previous ablations done by vein and vascular in [place] including a great saphenous vein ablation on the left with an anterior accessory branch ablation I think both of these were in 2016. On one of the previous visit she had a biopsy noted 2009 that was negative. She is not felt to have an arterial issue. She is not a diabetic. She does have a history of obstructive sleep apnea hypertension asthma as well as chronic venous insufficiency and lymphedema. On this occasion she noted 2 dry scaly patch on her left leg. She tried to put lotion on this it didn't really help. There were 2 open areas.the patient has been seeing her primary physician from 02/05/18 through 02/14/18. She had Unna boots applied. The superior wound now on the lateral left leg has closed but she's had one wound that remains open on the lateral left leg. This is not the same spot as we dealt with in 2018. ABIs in this clinic were 1.3 bilaterally 02/26/18; patient has a small wound on the left lateral calf. Dimensions are down. She has chronic venous insufficiency and lymphedema. 03/05/18; small open area on the left lateral calf. Dimensions are down. Tightly adherent necrotic debris over the surface  of the wound which was difficult to remove. Also the dressing [over collagen] stuck to the wound surface. This was removed with some difficulty as well. Change the primary dressing to [facility] ready Electronic Signature(s) Signed: 03/05/2018 4:28:35 PM By: Linton Ham MD Entered By: Linton Ham on 03/05/2018 13:23:20 [name], [name] ([id number]) -------------------------------------------------------------------------------- Physical Exam Details Patient Name: [name], [name]. Date of Service: 03/05/2018 12:30 PM Medical Record Number: [id number] Patient Account Number: [id number] Date of Birth/Sex: [birth year] (72 y.o. F) Treating RN: Montey Hora Primary Care Provider: [name] Other Clinician: Referring Provider: [name] Treating Provider/Extender: Tito Dine in Treatment: 2 Constitutional Patient is hypertensive.. Pulse regular and within target range for patient.Marland Kitchen Respirations regular, non-labored and within target range.. Temperature is normal and within the target range for the patient.Marland Kitchen appears in no distress. Notes wound exam; the area questions on the left lateral leg. This is smaller. Surface was not at all viable area using a #3 curet this was debrided with some difficulty secondary to patient discomfort. Nevertheless I was able to get down to a healthy viable surface hemostasis with direct pressure. Electronic Signature(s) Signed: 03/05/2018 4:28:35 PM By: Linton Ham MD Entered By: Linton Ham on 03/05/2018 13:24:15 [name] ([id number]) -------------------------------------------------------------------------------- Physician Orders Details Patient Name: [name], [name]. Date of Service: 03/05/2018 12:30 PM Medical Record  Number: [id number] Patient Account Number: [id number] Date of Birth/Sex: 1946-02-23 (72 y.o. F) Treating RN: Montey Hora Primary Care Provider: [name] Other Clinician: Referring  Provider: [name] Treating Provider/Extender: Tito Dine in Treatment: 2 Verbal / Phone Orders: No Diagnosis Coding Wound Cleansing Wound #4 Left,Lateral Lower Leg o Clean wound with Normal Saline. o May Shower, gently pat wound dry prior to applying new dressing. Anesthetic (add to Medication List) Wound #4 Left,Lateral Lower Leg o Topical Lidocaine 4% cream applied to wound bed prior to debridement (In Clinic Only). o Benzocaine Topical Anesthetic Spray applied to wound bed prior to debridement (In Clinic Only). Primary Wound Dressing Wound #4 Left,Lateral Lower Leg o Hydrafera Blue Ready Transfer Secondary Dressing Wound #4 Left,Lateral Lower Leg o ABD pad Dressing Change Frequency Wound #4 Left,Lateral Lower Leg o Change dressing every week Follow-up Appointments Wound #4 Left,Lateral Lower Leg o Return Appointment in 1 week. o Nurse Visit as needed Edema Control Wound #4 Left,Lateral Lower Leg o 4-Layer Compression System - Left Lower Extremity Electronic Signature(s) Signed: 03/05/2018 4:28:35 PM By: Linton Ham MD Signed: 03/05/2018 4:48:19 PM By: Montey Hora Entered By: Montey Hora on 03/05/2018 13:09:40 [name], [name] ([id number]) -------------------------------------------------------------------------------- Problem List Details Patient Name: [name], [name]. Date of Service: 03/05/2018 12:30 PM Medical Record Number: [id number] Patient Account Number: [id number] Date of Birth/Sex: 1945-12-14 (72 y.o. F) Treating RN: Montey Hora Primary Care Provider: [name] Other Clinician: Referring Provider: [name] Treating Provider/Extender: Tito Dine in Treatment: 2 Active Problems ICD-10 Impacting Encounter Code Description Active Date Wound Healing Diagnosis L97.228 Non-pressure chronic ulcer of left calf with other specified 02/19/2018 Yes severity I89.0 Lymphedema, not elsewhere  classified 02/19/2018 Yes I87.332 Chronic venous hypertension (idiopathic) with ulcer and 02/19/2018 Yes inflammation of left lower extremity Inactive Problems Resolved Problems Electronic Signature(s) Signed: 03/05/2018 4:28:35 PM By: Linton Ham MD Entered By: Linton Ham on 03/05/2018 13:21:40 [name], [name] ([id number]) -------------------------------------------------------------------------------- Progress Note Details Patient Name: [name], [name]. Date of Service: 03/05/2018 12:30 PM Medical Record Number: [id number] Patient Account Number: [id number] Date of Birth/Sex: [birth year] (72 y.o. F) Treating RN: Montey Hora Primary Care Provider: [name] Other Clinician: Referring Provider: [name] Treating Provider/Extender: Tito Dine in Treatment: 2 Subjective History of Present Illness (HPI) Pleasant 72 year old with history of chronic venous insufficiency. No diabetes or peripheral vascular disease. Left ABI 1.29. Questionable history of left lower extremity DVT. She developed a recurrent ulceration on her left lateral calf in December 2015, which she attributes to poor diet and subsequent lower extremity edema. She underwent endovenous laser ablation of her left greater saphenous vein in 2010. She underwent laser ablation of accessory branch of left GSV in April 2016 by Dr. Kellie Simmering at [facility]. She was previously wearing Unna boots, which she tolerated well. Tolerating 2 layer compression and cadexomer iodine. She returns to clinic for follow-up and is without new complaints. She denies any significant pain at this time. She reports persistent pain with pressure. No claudication or ischemic rest pain. No fever or chills. No drainage. READMISSION 11/13/16; this is a 72 year old woman who is not a diabetic. She is here for a review of a painful area on her left medial lower extremity. I note that she was seen here previously last year for wound  I believe to be in the same area. At that time she had undergone previously a left greater saphenous vein ablation by Dr. Kellie Simmering and she had a ablation of the  anterior accessory branch of the left greater saphenous vein in March 2016. Seeing that the wound actually closed over. In reviewing the history with her today the ulcer in this area has been recurrent. She describes a biopsy of this area in 2009 that only showed stasis physiology. 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 Do not see that she has ever had arterial studies in the left leg. She tells me she has a follow-up with Dr. Kellie Simmering in roughly 10 days In any case she developed the reopening of this area roughly a month ago. On the background of this she describes rapidly increasing edema which has responded to Lasix 40 mg and metolazone 2.5 mg as well as the patient's lymph massage. She has been told she has both venous insufficiency and lymphedema but she cannot tolerate compression stockings 11/28/16; the patient saw Dr. Kellie Simmering recently. Per the patient he did arterial Dopplers in the office that did not show evidence of arterial insufficiency, per the patient he stated "treat this like an ordinary venous ulcer". She also saw her dermatologist Dr. Ronnald Ramp who felt that this was more of a vascular ulcer. In general things are improving although she arrives today with increasing bilateral lower extremity edema with weeping a deeper fluid through the wound on the left medial leg compatible with some degree of lymphedema 12/04/16; the patient's wound is fully epithelialized but I don't think fully healed. We will do another week of depression with Promogran and TCA however I suspect we'll be able to discharge her next week.  This is a very unusual-looking wound which was initially a figure-of-eight type wound lying on its side surrounded by petechial like hemorrhage. She has had venous ablation on this side. She apparently does not have an arterial issue per Dr. Kellie Simmering. She saw her dermatologist thought it was "vascular". Patient is definitely going to need ongoing compression and I talked about this with her today she will go to elastic therapy after she leaves here next week 12/11/16; the patient's wound is not completely closed today. She has surrounding scar tissue and in further discussion with the patient it would appear that she had ulcers in this area in 2009 for a prolonged period of time ultimately requiring a punch biopsy of this area that only showed venous insufficiency. I did not previously pickup on this part of the history from the patient. 12/18/16; the patient's wound is completely epithelialized. There is no open area here. She has significant bilateral venous insufficiency with secondary lymphedema to a mild-to-moderate degree she does not have compression stockings.. She did not say anything to me when I was in the room, she told our intake nurse that she was still having pain in this area. This isn't unusual recurrent small open area. She is going to go to elastic therapy to obtain compression stockings. 12/25/16; the patient's wound is fully epithelialized. There is no open area here. The patient describes some continued episodic discomfort in this area medial left calf. However everything looks fine and healed here. She is been to elastic therapy and [name], [name] ([id number]) caught herself 15-20 mmHg stockings, they apparently were having trouble getting 20-30 mm stockings in her size 01/22/17; this is a patient we discharged from the clinic a month  ago. She has a recurrent open wound on her medial left calf. She had 15 mm support stockings. I told her I thought she needed 20-30 mm compression  stockings. She tells me that she has been ill with hospitalization secondary to asthma and is been found to have severe hypokalemia likely secondary to a combination of Lasix and metolazone. This morning she noted blistering and leaking fluid on the posterior part of her left leg. She called our intake nurse urgently and we was saw her this afternoon. She has not had any real discomfort here. I don't know that she's been wearing any stockings on this leg for at least 2-3 days. ABIs in this clinic were 1.21 on the right and 1.3 on the left. She is previously seen vascular surgery who does not think that there is a peripheral arterial issue. 01/30/17; Patient arrives with no open wound on the left leg. She has been to elastic therapy and obtained 20-56mmhg below knee stockings and she has one on the right leg today. READMISSION 02/19/18; this Riepe is a now 72 year old patient we've had in this clinic perhaps 3 times before. I had last looked at her from January 07 December 2016 with an area on the medial left leg. We discharged her on 12/25/16 however she had to be readmitted on 01/22/17 with a recurrence. I have in my notes that we discharged her on 20-30 mm stockings although she tells me she was only wearing support hose because she cannot get stockings on predominantly related to her cervical spine surgery/issues. She has had previous ablations done by vein and vascular in [place] including a great saphenous vein ablation on the left with an anterior accessory branch ablation I think both of these were in 2016. On one of the previous visit she had a biopsy noted 2009 that was negative. She is not felt to have an arterial issue. She is not a diabetic. She does have a history of obstructive sleep apnea hypertension asthma as well as chronic venous insufficiency and lymphedema. On this occasion she noted 2 dry scaly patch on her left leg. She tried to put lotion on this it didn't really help. There  were 2 open areas.the patient has been seeing her primary physician from 02/05/18 through 02/14/18. She had Unna boots applied. The superior wound now on the lateral left leg has closed but she's had one wound that remains open on the lateral left leg. This is not the same spot as we dealt with in 2018. ABIs in this clinic were 1.3 bilaterally 02/26/18; patient has a small wound on the left lateral calf. Dimensions are down. She has chronic venous insufficiency and lymphedema. 03/05/18; small open area on the left lateral calf. Dimensions are down. Tightly adherent necrotic debris over the surface of the wound which was difficult to remove. Also the dressing [over collagen] stuck to the wound surface. This was removed with some difficulty as well. Change the primary dressing to [facility] ready Objective Constitutional Patient is hypertensive.. Pulse regular and within target range for patient.Marland Kitchen Respirations regular, non-labored and within target range.. Temperature is normal and within the target range for the patient.Marland Kitchen appears in no distress. Vitals Time Taken: 12:45 PM, Height: 63 in, Weight: 219.9 lbs, BMI: 38.9, Temperature: 98.2 F, Pulse: 75 bpm, Respiratory Rate: 18 breaths/min, Blood Pressure: 142/72 mmHg. General Notes: wound exam; the area questions on the left lateral leg. This is smaller. Surface was not at all viable area using a #3 curet  this was debrided with some difficulty secondary to patient discomfort. Nevertheless I was able to get down to a healthy viable surface hemostasis with direct pressure. Integumentary (Hair, Skin) Wound #4 status is Open. Original cause of wound was Gradually Appeared. The wound is located on the Left,Lateral Lower Leg. The wound measures 0.5cm length x 0.5cm width x 0.1cm depth; 0.196cm^2 area and 0.02cm^3 volume. There is no JOLI, KOOB. ([id number]) tunneling or undermining noted. There is a medium amount of serosanguineous drainage noted.  The wound margin is flat and intact. There is no granulation within the wound bed. There is a large (67-100%) amount of necrotic tissue within the wound bed including Adherent Slough. Periwound temperature was noted as No Abnormality. The periwound has tenderness on palpation. 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 There was a Excisional Skin/Subcutaneous Tissue Debridement with a total area of 0.25 sq cm performed by [name] Dillon, MD. With the following instrument(s): Curette. to remove Viable and Non-Viable tissue/material Material removed includes Subcutaneous Tissue, and Slough, Fibrin/Exudate, and [place] after achieving pain control using Lidocaine 4% Topical Solution. No specimens were taken. A time out was conducted at 13:06, prior to the start of the procedure. A Minimum amount of bleeding was controlled with Pressure. The procedure was tolerated well with a pain level of 0 throughout and a pain level of 0 following the procedure. Post Debridement Measurements: 0.8cm length x 0.8cm width x 0.1cm depth; 0.05cm^3 volume. Character of Wound/Ulcer Post Debridement is improved. Severity of Tissue Post Debridement is: Fat layer exposed. Post procedure Diagnosis Wound #4: Same as Pre-Procedure Plan Wound Cleansing: Wound #4 Left,Lateral Lower Leg: Clean wound with Normal Saline. May Shower, gently pat wound dry prior to applying new dressing. Anesthetic (add to Medication List): Wound #4 Left,Lateral Lower Leg: Topical Lidocaine 4% cream applied to wound bed prior to debridement (In Clinic  Only). Benzocaine Topical Anesthetic Spray applied to wound bed prior to debridement (In Clinic Only). Primary Wound Dressing: Wound #4 Left,Lateral Lower Leg: Hydrafera Blue Ready Transfer Secondary Dressing: Wound #4 Left,Lateral Lower Leg: ABD pad Dressing Change Frequency: DOLLIE, BRESSI ([id number]) Wound #4 Left,Lateral Lower Leg: Change dressing every week Follow-up Appointments: Wound #4 Left,Lateral Lower Leg: Return Appointment in 1 week. Nurse Visit as needed Edema Control: Wound #4 Left,Lateral Lower Leg: 4-Layer Compression System - Left Lower Extremity #1 we will change the primary dressing the Hydrofera Blue under compression #2 I'm hopeful that this will really heal within the next week to 2. #3 I once had again had a discussion with this patient about stockings. She had a previous severe skin reaction to the inner layer of juxta light stockings several years ago. She is no longer certain she can get 00-93 mm stockings on even with the sock Donner. not a lot of options here. Perhaps extremities stockings red she has support hose and she is leaning towards that although this recurrent wounds seem to happen with the stockings. I've asked her to get a new pair Electronic Signature(s) Signed: 03/05/2018 4:28:35 PM By: Linton Ham MD Entered By: Linton Ham on 03/05/2018 13:25:53 Picazo, [name] ([id number]) -------------------------------------------------------------------------------- SuperBill Details Patient Name: HUERTAS,  Kaitlinn J. Date of Service: 03/05/2018 Medical Record Number: [id number] Patient Account Number: [id number] Date of Birth/Sex: 1946-10-12 (72 y.o. F) Treating RN: Montey Hora Primary Care Provider: [name] Other Clinician: Referring Provider: [name] Treating Provider/Extender: Tito Dine in Treatment: 2 Diagnosis Coding ICD-10 Codes Code Description [phone] Non-pressure chronic ulcer of left calf with  other specified severity I89.0 Lymphedema, not elsewhere classified I87.332 Chronic venous hypertension (idiopathic) with ulcer and inflammation of left lower extremity Facility Procedures CPT4: Description Modifier Quantity Code [id number] 11042 - DEB SUBQ TISSUE 20 SQ CM/< 1 ICD-10 Diagnosis Description L97.228 Non-pressure chronic ulcer of left calf with other specified severity I89.0 Lymphedema, not elsewhere classified I87.332 Chronic  venous hypertension (idiopathic) with ulcer and inflammation of left lower extremity Physician Procedures CPT4: Description Modifier Quantity Code [id number] 11042 - WC PHYS SUBQ TISS 20 SQ CM 1 ICD-10 Diagnosis Description L97.228 Non-pressure chronic ulcer of left calf with other specified severity I89.0 Lymphedema, not elsewhere classified I87.332 Chronic  venous hypertension (idiopathic) with ulcer and inflammation of left lower extremity Electronic Signature(s) Signed: 03/05/2018 4:28:35 PM By: Linton Ham MD Entered By: Linton Ham on 03/05/2018 13:26:19

## 2018-03-09 NOTE — Progress Notes (Signed)
SAUDIA, SMYSER ([id number]) Visit Report for 03/05/2018 Arrival Information Details Patient Name: [name], [name]. Date of Service: 03/05/2018 12:30 PM Medical Record Number: [id number] Patient Account Number: [id number] Date of Birth/Sex: [birth year] (72 y.o. F) Treating RN: Roger Shelter Primary Care Deanne Bedgood: [name] Other Clinician: Referring Jareth Pardee: [name] Treating Guerry Covington/Extender: Tito Dine in Treatment: 2 Visit Information History Since Last Visit All ordered tests and consults were completed: No Patient Arrived: Ambulatory Added or deleted any medications: No Arrival Time: 12:44 Any new allergies or adverse reactions: No Accompanied By: self Had a fall or experienced change in No Transfer Assistance: None activities of daily living that may affect Patient Identification Verified: Yes risk of falls: Secondary Verification Process Completed: Yes Signs or symptoms of abuse/neglect since last visito No Patient Requires Transmission-Based No Hospitalized since last visit: No Precautions: Implantable device outside of the clinic excluding No Patient Has Alerts: No cellular tissue based products placed in the center since last visit: Pain Present Now: Yes Electronic Signature(s) Signed: 03/05/2018 4:21:17 PM By: Roger Shelter Entered By: Roger Shelter on 03/05/2018 12:44:56 Lemelle, [name] ([id number]) -------------------------------------------------------------------------------- Encounter Discharge Information Details Patient Name: [name]. Date of Service: 03/05/2018 12:30 PM Medical Record Number: [id number] Patient Account Number: [id number] Date of Birth/Sex: 11-19-1945 (72 y.o. F) Treating RN: Roger Shelter Primary Care Deari Sessler: [name] Other Clinician: Referring Carmelina Balducci: [name] Treating Grabiel Schmutz/Extender: Tito Dine in Treatment: 2 Encounter Discharge Information  Items Discharge Pain Level: 0 Discharge Condition: Stable Ambulatory Status: Ambulatory Discharge Destination: Home Transportation: Private Auto Schedule Follow-up Appointment: Yes Medication Reconciliation completed and No provided to Patient/Care Khamiya [name]: Provided on Clinical Summary of Care: 03/05/2018 Form Type Recipient Paper Patient LP Electronic Signature(s) Signed: 03/06/2018 2:11:15 PM By: Ruthine Dose Entered By: Ruthine Dose on 03/05/2018 13:27:54 Wooldridge, [name] ([id number]) -------------------------------------------------------------------------------- Lower Extremity Assessment Details Patient Name: [name]. Date of Service: 03/05/2018 12:30 PM Medical Record Number: [id number] Patient Account Number: [id number] Date of Birth/Sex: November 23, 1945 (72 y.o. F) Treating RN: Roger Shelter Primary Care Kamau Weatherall: [name] Other Clinician: Referring Doreene Forrey: [name] Treating Ersilia Brawley/Extender: Tito Dine in Treatment: 2 Edema Assessment Assessed: [Left: No] [Right: No] [Left: Edema] [Right: :] Calf Left: Right: Point of Measurement: 32 cm From Medial Instep 40 cm cm Ankle Left: Right: Point of Measurement: 12 cm From Medial Instep 24 cm cm Vascular Assessment Claudication: Claudication Assessment [Left:None] Pulses: Dorsalis Pedis Palpable: [Left:Yes] Posterior Tibial Extremity colors, hair growth, and conditions: Extremity Color: [Left:Normal] Hair Growth on Extremity: [Left:Yes] Temperature of Extremity: [Left:Warm] Capillary Refill: [Left:< 3 seconds] Toe Nail Assessment Left: Right: Thick: Yes Discolored: Yes Deformed: No Improper Length and Hygiene: Yes Electronic Signature(s) Signed: 03/05/2018 4:21:17 PM By: Roger Shelter Entered By: Roger Shelter on 03/05/2018 12:55:32 Steinert, [name] ([id number]) -------------------------------------------------------------------------------- Multi Wound Chart  Details Patient Name: [name]. Date of Service: 03/05/2018 12:30 PM Medical Record Number: [id number] Patient Account Number: [id number] Date of Birth/Sex: [birth year] (72 y.o. F) Treating RN: Montey Hora Primary Care Jalesha Plotz: [name] Other Clinician: Referring Kellis Mcadam: [name] Treating Hashir Deleeuw/Extender: Tito Dine in Treatment: 2 Vital Signs Height(in): 63 Pulse(bpm): 75 Weight(lbs): 219.9 Blood Pressure(mmHg): 142/72 Body Mass Index(BMI): 39 Temperature(F): 98.2 Respiratory Rate 18 (breaths/min): Photos: [4:No Photos] [N/A:N/A] Wound Location: [4:Left Lower Leg - Lateral] [N/A:N/A] Wounding Event: [4:Gradually Appeared] [N/A:N/A] Primary Etiology: [4:Venous Leg Ulcer] [N/A:N/A] Comorbid History: [4:Cataracts, Asthma, Sleep Apnea, Deep Vein Thrombosis, Hypertension, Peripheral Venous Disease, Osteoarthritis, Received Chemotherapy, Received Radiation] [N/A:N/A]  Date Acquired: [4:02/05/2018] [N/A:N/A] Weeks of Treatment: [4:2] [N/A:N/A] Wound Status: [4:Open] [N/A:N/A] Measurements L x W x D [4:0.5x0.5x0.1] [N/A:N/A] (cm) Area (cm) : [4:0.196] [N/A:N/A] Volume (cm) : [4:0.02] [N/A:N/A] % Reduction in Area: [4:65.30%] [N/A:N/A] % Reduction in Volume: [4:64.90%] [N/A:N/A] Classification: [4:Full Thickness Without Exposed Support Structures] [N/A:N/A] Exudate Amount: [4:Medium] [N/A:N/A] Exudate Type: [4:Serosanguineous] [N/A:N/A] Exudate Color: [4:red, brown] [N/A:N/A] Wound Margin: [4:Flat and Intact] [N/A:N/A] Granulation Amount: [4:None Present (0%)] [N/A:N/A] Necrotic Amount: [4:Large (67-100%)] [N/A:N/A] Exposed Structures: [4:Fascia: No Fat Layer (Subcutaneous Tissue) Exposed: No Tendon: No Muscle: No Joint: No Bone: No] [N/A:N/A] Epithelialization: [4:None] [N/A:N/A] Debridement: [4:Debridement - Excisional] [N/A:N/A] Pre-procedure 13:06 N/A N/A Verification/Time Out Taken: Pain Control: Lidocaine 4% Topical Solution N/A  N/A Tissue Debrided: Subcutaneous, Slough N/A N/A Level: Skin/Subcutaneous Tissue N/A N/A Debridement Area (sq cm): 0.25 N/A N/A Instrument: Curette N/A N/A Bleeding: Minimum N/A N/A Hemostasis Achieved: Pressure N/A N/A Procedural Pain: 0 N/A N/A Post Procedural Pain: 0 N/A N/A Debridement Treatment Procedure was tolerated well N/A N/A Response: Post Debridement 0.8x0.8x0.1 N/A N/A Measurements L x W x D (cm) Post Debridement Volume: 0.05 N/A N/A (cm) Periwound Skin Texture: No Abnormalities Noted N/A N/A Periwound Skin Moisture: No Abnormalities Noted N/A N/A Periwound Skin Color: No Abnormalities Noted N/A N/A Temperature: No Abnormality N/A N/A Tenderness on Palpation: Yes N/A N/A Wound Preparation: Ulcer Cleansing: N/A N/A Rinsed/Irrigated with Saline Topical Anesthetic Applied: Other: lidocaine 4% Procedures Performed: Debridement N/A N/A Treatment Notes Electronic Signature(s) Signed: 03/05/2018 4:28:35 PM By: Linton Ham MD Entered By: Linton Ham on 03/05/2018 13:22:11 SIMRIT, GOHLKE ([id number]) -------------------------------------------------------------------------------- Multi-Disciplinary Care Plan Details Patient Name: VIKKIE, GOEDEN. Date of Service: 03/05/2018 12:30 PM Medical Record Number: [id number] Patient Account Number: [id number] Date of Birth/Sex: 1945/12/27 (72 y.o. F) Treating RN: Montey Hora Primary Care Corrado Hymon: [name] Other Clinician: Referring Shereese Bonnie: [name] Treating Johanne Mcglade/Extender: Tito Dine in Treatment: 2 Active Inactive ` Orientation to the Wound Care Program Nursing Diagnoses: Knowledge deficit related to the wound healing center program Goals: Patient/caregiver will verbalize understanding of the [facility] Program Date Initiated: 02/19/2018 Target Resolution Date: 03/21/2018 Goal Status: Active Interventions: Provide education on orientation to the wound  center Notes: ` Venous Leg Ulcer Nursing Diagnoses: Actual venous Insuffiency (use after diagnosis is confirmed) Goals: Patient will maintain optimal edema control Date Initiated: 02/19/2018 Target Resolution Date: 03/21/2018 Goal Status: Active Interventions: Compression as ordered Treatment Activities: Therapeutic compression applied : 02/19/2018 Notes: ` Wound/Skin Impairment Nursing Diagnoses: Impaired tissue integrity Goals: Ulcer/skin breakdown will have a volume reduction of 80% by week 12 Date Initiated: 02/19/2018 Target Resolution Date: 06/21/2018 SHINE, SCROGHAM ([id number]) Goal Status: Active Interventions: Assess ulceration(s) every visit Treatment Activities: Topical wound management initiated : 02/19/2018 Notes: Electronic Signature(s) Signed: 03/05/2018 4:48:19 PM By: Montey Hora Entered By: Montey Hora on 03/05/2018 13:06:10 [name] ([id number]) -------------------------------------------------------------------------------- Pain Assessment Details Patient Name: EUNIQUE, BALIK. Date of Service: 03/05/2018 12:30 PM Medical Record Number: [id number] Patient Account Number: [id number] Date of Birth/Sex: 1946-04-15 (72 y.o. F) Treating RN: Roger Shelter Primary Care Welcome Fults: [name] Other Clinician: Referring Gurtha Picker: [name] Treating Jennalyn Cawley/Extender: Tito Dine in Treatment: 2 Active Problems Location of Pain Severity and Description of Pain Patient Has Paino Patient Unable to Respond Site Locations Duration of the Pain. Constant / Intermittento Intermittent Rate the pain. Current Pain Level: 3 Character of Pain Describe the Pain: Burning Pain Management and Medication Current Pain Management: Electronic Signature(s) Signed: 03/05/2018 4:21:17 PM  By: Roger Shelter Entered By: Roger Shelter on 03/05/2018 12:45:09 [name]  ([id number]) -------------------------------------------------------------------------------- Patient/Caregiver Education Details Patient Name: KEARSTIN, LEARN. Date of Service: 03/05/2018 12:30 PM Medical Record Number: [id number] Patient Account Number: [id number] Date of Birth/Gender: [birth year] (72 y.o. F) Treating RN: Roger Shelter Primary Care Physician: [name] Other Clinician: Referring Physician: [name] Treating Physician/Extender: Tito Dine in Treatment: 2 Education Assessment Education Provided To: Patient Education Topics Provided Wound Debridement: Handouts: Wound Debridement Methods: Explain/Verbal Responses: State content correctly Wound/Skin Impairment: Handouts: Caring for Your Ulcer Methods: Explain/Verbal Responses: State content correctly Electronic Signature(s) Signed: 03/05/2018 4:21:17 PM By: Roger Shelter Entered By: Roger Shelter on 03/05/2018 13:26:15 Timpone, [name] ([id number]) -------------------------------------------------------------------------------- Wound Assessment Details Patient Name: LORENIA, HOSTON. Date of Service: 03/05/2018 12:30 PM Medical Record Number: [id number] Patient Account Number: [id number] Date of Birth/Sex: October 12, 1946 (72 y.o. F) Treating RN: Roger Shelter Primary Care Kahlil Cowans: [name] Other Clinician: Referring Arlisa Leclere: [name] Treating Delfino Friesen/Extender: Tito Dine in Treatment: 2 Wound Status Wound Number: 4 Primary Venous Leg Ulcer Etiology: Wound Location: Left Lower Leg - Lateral Wound Open Wounding Event: Gradually Appeared Status: Date Acquired: 02/05/2018 Comorbid Cataracts, Asthma, Sleep Apnea, Deep Vein Weeks Of Treatment: 2 History: Thrombosis, Hypertension, Peripheral Venous Clustered Wound: No Disease, Osteoarthritis, Received Chemotherapy, Received Radiation Photos Photo Uploaded By: Roger Shelter on 03/05/2018  16:17:49 Wound Measurements Length: (cm) 0.5 Width: (cm) 0.5 Depth: (cm) 0.1 Area: (cm) 0.196 Volume: (cm) 0.02 % Reduction in Area: 65.3% % Reduction in Volume: 64.9% Epithelialization: None Tunneling: No Undermining: No Wound Description Full Thickness Without Exposed Support Classification: Structures Wound Margin: Flat and Intact Exudate Medium Amount: Exudate Type: Serosanguineous Exudate Color: red, brown Foul Odor After Cleansing: No Slough/Fibrino Yes Wound Bed Granulation Amount: None Present (0%) Exposed Structure Necrotic Amount: Large (67-100%) Fascia Exposed: No Necrotic Quality: Adherent Slough Fat Layer (Subcutaneous Tissue) Exposed: No Tendon Exposed: No Muscle Exposed: No Joint Exposed: No FORESTINE, MACHO. ([id number]) Bone Exposed: No Periwound Skin Texture Texture Color No Abnormalities Noted: No No Abnormalities Noted: No Moisture Temperature / Pain No Abnormalities Noted: No Temperature: No Abnormality Tenderness on Palpation: Yes Wound Preparation Ulcer Cleansing: Rinsed/Irrigated with Saline Topical Anesthetic Applied: Other: lidocaine 4%, Treatment Notes Wound #4 (Left, Lateral Lower Leg) 1. Cleansed with: Clean wound with Normal Saline 2. Anesthetic Topical Lidocaine 4% cream to wound bed prior to debridement 4. Dressing Applied: Hydrafera Blue 5. Secondary Dressing Applied Non-Adherent pad 7. Secured with 4-Layer Compression System - Left Lower Extremity Notes unna to anchor Electronic Signature(s) Signed: 03/05/2018 4:21:17 PM By: Roger Shelter Entered By: Roger Shelter on 03/05/2018 12:58:56 Monda, [name] ([id number]) -------------------------------------------------------------------------------- Vitals Details Patient Name: PROVIDENCIA, HOTTENSTEIN. Date of Service: 03/05/2018 12:30 PM Medical Record Number: [id number] Patient Account Number: [id number] Date of Birth/Sex: 12-18-1945 (72 y.o. F) Treating RN: Roger Shelter Primary Care Rue Valladares: [name] Other Clinician: Referring Timohty Renbarger: [name] Treating Patriece Archbold/Extender: Tito Dine in Treatment: 2 Vital Signs Time Taken: 12:45 Temperature (F): 98.2 Height (in): 63 Pulse (bpm): 75 Weight (lbs): 219.9 Respiratory Rate (breaths/min): 18 Body Mass Index (BMI): 38.9 Blood Pressure (mmHg): 142/72 Reference Range: 80 - 120 mg / dl Electronic Signature(s) Signed: 03/05/2018 4:21:17 PM By: Roger Shelter Entered By: Roger Shelter on 03/05/2018 12:45:47

## 2018-03-12 ENCOUNTER — Encounter: Payer: Medicare Other | Admitting: Internal Medicine

## 2018-03-12 DIAGNOSIS — I87312 Chronic venous hypertension (idiopathic) with ulcer of left lower extremity: Secondary | ICD-10-CM | POA: Diagnosis not present

## 2018-03-12 DIAGNOSIS — G4733 Obstructive sleep apnea (adult) (pediatric): Secondary | ICD-10-CM | POA: Diagnosis not present

## 2018-03-12 DIAGNOSIS — I10 Essential (primary) hypertension: Secondary | ICD-10-CM | POA: Diagnosis not present

## 2018-03-12 DIAGNOSIS — L97222 Non-pressure chronic ulcer of left calf with fat layer exposed: Secondary | ICD-10-CM | POA: Diagnosis not present

## 2018-03-12 DIAGNOSIS — L97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d: Secondary | ICD-10-CM | POA: Diagnosis not present

## 2018-03-12 DIAGNOSIS — J45909 Unspecified asthma, uncomplicated: Secondary | ICD-10-CM | POA: Diagnosis not present

## 2018-03-12 DIAGNOSIS — I890 Lymphedema, not elsewhere classified: Secondary | ICD-10-CM | POA: Diagnosis not present

## 2018-03-12 DIAGNOSIS — I87332 Chronic venous hypertension (idiopathic) with ulcer and inflammation of left lower extremity: Secondary | ICD-10-CM | POA: Diagnosis not present

## 2018-03-15 NOTE — Progress Notes (Signed)
NICHOLLE, FALZON ([id number]) Visit Report for 03/12/2018 Arrival Information Details Patient Name: MCCARTNEY, BRUCKS. Date of Service: 03/12/2018 1:30 PM Medical Record Number: [id number] Patient Account Number: [id number] Date of Birth/Sex: October 20, 1946 (72 y.o. F) Treating RN: Roger Shelter Primary Care [name] [name]: [name] Other Clinician: Referring Lailany Enoch: [name] Treating Maciah Feeback/Extender: Tito Dine in Treatment: 3 Visit Information History Since Last Visit All ordered tests and consults were completed: No Patient Arrived: Ambulatory Added or deleted any medications: No Arrival Time: 13:35 Any new allergies or adverse reactions: No Accompanied By: self Had a fall or experienced change in No Transfer Assistance: None activities of daily living that may affect Patient Identification Verified: Yes risk of falls: Secondary Verification Process Completed: Yes Signs or symptoms of abuse/neglect since last visito No Patient Requires Transmission-Based No Hospitalized since last visit: No Precautions: Implantable device outside of the clinic excluding No Patient Has Alerts: No cellular tissue based products placed in the center since last visit: Pain Present Now: No Electronic Signature(s) Signed: 03/12/2018 4:46:18 PM By: Roger Shelter Entered By: Roger Shelter on 03/12/2018 13:35:31 Bergfeld, [name] ([id number]) -------------------------------------------------------------------------------- Encounter Discharge Information Details Patient Name: SORAIYA, AHNER. Date of Service: 03/12/2018 1:30 PM Medical Record Number: [id number] Patient Account Number: [id number] Date of Birth/Sex: [birth year] (72 y.o. F) Treating RN: Montey Hora Primary Care Mase Dhondt: [name] Other Clinician: Referring Michaelanthony Kempton: [name] Treating Thedford Bunton/Extender: Tito Dine in Treatment: 3 Encounter Discharge Information Items Discharge  Condition: Stable Ambulatory Status: Ambulatory Discharge Destination: Home Transportation: Private Auto Accompanied By: self Schedule Follow-up Appointment: Yes Clinical Summary of Care: Electronic Signature(s) Signed: 03/12/2018 3:12:03 PM By: Montey Hora Entered By: Montey Hora on 03/12/2018 15:12:02 [name] ([id number]) -------------------------------------------------------------------------------- Lower Extremity Assessment Details Patient Name: BELKY, MUNDO. Date of Service: 03/12/2018 1:30 PM Medical Record Number: [id number] Patient Account Number: [id number] Date of Birth/Sex: 13-Oct-1946 (72 y.o. F) Treating RN: Roger Shelter Primary Care Marlyne Totaro: [name] Other Clinician: Referring Ai Sonnenfeld: [name] Treating Lilianna Case/Extender: Tito Dine in Treatment: 3 Edema Assessment Assessed: [Left: No] [Right: No] [Left: Edema] [Right: :] Calf Left: Right: Point of Measurement: 32 cm From Medial Instep 43.8 cm cm Ankle Left: Right: Point of Measurement: 12 cm From Medial Instep 22.5 cm cm Vascular Assessment Claudication: Claudication Assessment [Left:None] Pulses: Dorsalis Pedis Palpable: [Left:Yes] Posterior Tibial Extremity colors, hair growth, and conditions: Extremity Color: [Left:Normal] Hair Growth on Extremity: [Left:Yes] Temperature of Extremity: [Left:Warm] Capillary Refill: [Left:< 3 seconds] Toe Nail Assessment Left: Right: Thick: Yes Discolored: Yes Deformed: No Improper Length and Hygiene: Yes Electronic Signature(s) Signed: 03/12/2018 4:46:18 PM By: Roger Shelter Entered By: Roger Shelter on 03/12/2018 13:44:48 Wendling, [name] ([id number]) -------------------------------------------------------------------------------- Multi Wound Chart Details Patient Name: AYZA, RIPOLL. Date of Service: 03/12/2018 1:30 PM Medical Record Number: [id number] Patient Account Number: [id number] Date of Birth/Sex:  1946/05/30 (72 y.o. F) Treating RN: Cornell Barman Primary Care Antonio Woodhams: [name] Other Clinician: Referring Amrie Gurganus: [name] Treating Zane Pellecchia/Extender: Tito Dine in Treatment: 3 Vital Signs Height(in): 63 Pulse(bpm): 72 Weight(lbs): 219.9 Blood Pressure(mmHg): 136/78 Body Mass Index(BMI): 39 Temperature(F): 98.2 Respiratory Rate 18 (breaths/min): Photos: [4:No Photos] [N/A:N/A] Wound Location: [4:Left Lower Leg - Lateral] [N/A:N/A] Wounding Event: [4:Gradually Appeared] [N/A:N/A] Primary Etiology: [4:Venous Leg Ulcer] [N/A:N/A] Comorbid History: [4:Cataracts, Asthma, Sleep Apnea, Deep Vein Thrombosis, Hypertension, Peripheral Venous Disease, Osteoarthritis, Received Chemotherapy, Received Radiation] [N/A:N/A] Date Acquired: [4:02/05/2018] [N/A:N/A] Weeks of Treatment: [4:3] [N/A:N/A] Wound Status: [4:Open] [N/A:N/A] Measurements L x W x D [  4:0.7x0.5x0.1] [N/A:N/A] (cm) Area (cm) : [4:0.275] [N/A:N/A] Volume (cm) : [4:0.027] [N/A:N/A] % Reduction in Area: [4:51.30%] [N/A:N/A] % Reduction in Volume: [4:52.60%] [N/A:N/A] Classification: [4:Full Thickness Without Exposed Support Structures] [N/A:N/A] Exudate Amount: [4:Medium] [N/A:N/A] Exudate Type: [4:Serosanguineous] [N/A:N/A] Exudate Color: [4:red, brown] [N/A:N/A] Wound Margin: [4:Flat and Intact] [N/A:N/A] Granulation Amount: [4:None Present (0%)] [N/A:N/A] Necrotic Amount: [4:Large (67-100%)] [N/A:N/A] Exposed Structures: [4:Fascia: No Fat Layer (Subcutaneous Tissue) Exposed: No Tendon: No Muscle: No Joint: No Bone: No] [N/A:N/A] Epithelialization: [4:Small (1-33%)] [N/A:N/A] Debridement: [4:Debridement - Excisional] [N/A:N/A] Pre-procedure 14:01 N/A N/A Verification/Time Out Taken: Pain Control: Other N/A N/A Tissue Debrided: Necrotic/Eschar, N/A N/A Subcutaneous Level: Skin/Subcutaneous Tissue N/A N/A Debridement Area (sq cm): 0.06 N/A N/A Instrument: Curette N/A N/A Bleeding:  None N/A N/A Procedural Pain: 0 N/A N/A Post Procedural Pain: 0 N/A N/A Debridement Treatment Procedure was tolerated well N/A N/A Response: Post Debridement 0.2x0.3x0.2 N/A N/A Measurements L x W x D (cm) Post Debridement Volume: 0.009 N/A N/A (cm) Periwound Skin Texture: No Abnormalities Noted N/A N/A Periwound Skin Moisture: Dry/Scaly: Yes N/A N/A Maceration: No Periwound Skin Color: No Abnormalities Noted N/A N/A Temperature: No Abnormality N/A N/A Tenderness on Palpation: Yes N/A N/A Wound Preparation: Ulcer Cleansing: N/A N/A Rinsed/Irrigated with Saline Topical Anesthetic Applied: Other: lidocaine 4% Procedures Performed: Debridement N/A N/A Treatment Notes Electronic Signature(s) Signed: 03/13/2018 1:16:01 PM By: Linton Ham MD Entered By: Linton Ham on 03/12/2018 14:29:01 [name], [name] ([id number]) -------------------------------------------------------------------------------- Multi-Disciplinary Care Plan Details Patient Name: YAQUELIN, LANGELIER. Date of Service: 03/12/2018 1:30 PM Medical Record Number: [id number] Patient Account Number: [id number] Date of Birth/Sex: February 03, 1946 (72 y.o. F) Treating RN: Cornell Barman Primary Care Makaela Cando: [name] Other Clinician: Referring Stellan Vick: [name] Treating Rajat Staver/Extender: Tito Dine in Treatment: 3 Active Inactive ` Orientation to the Wound Care Program Nursing Diagnoses: Knowledge deficit related to the wound healing center program Goals: Patient/caregiver will verbalize understanding of the [facility] Program Date Initiated: 02/19/2018 Target Resolution Date: 03/21/2018 Goal Status: Active Interventions: Provide education on orientation to the wound center Notes: ` Venous Leg Ulcer Nursing Diagnoses: Actual venous Insuffiency (use after diagnosis is confirmed) Goals: Patient will maintain optimal edema control Date Initiated: 02/19/2018 Target Resolution Date:  03/21/2018 Goal Status: Active Interventions: Compression as ordered Treatment Activities: Therapeutic compression applied : 02/19/2018 Notes: ` Wound/Skin Impairment Nursing Diagnoses: Impaired tissue integrity Goals: Ulcer/skin breakdown will have a volume reduction of 80% by week 12 Date Initiated: 02/19/2018 Target Resolution Date: 06/21/2018 SULTANA, TIERNEY ([id number]) Goal Status: Active Interventions: Assess ulceration(s) every visit Treatment Activities: Topical wound management initiated : 02/19/2018 Notes: Electronic Signature(s) Signed: 03/12/2018 5:47:31 PM By: [name], [name], RN, CWS, [name], [name] Entered By: [name], [name], RN, CWS, Kim on 03/12/2018 13:59:22 Mascari, [name] ([id number]) -------------------------------------------------------------------------------- Pain Assessment Details Patient Name: TALI, CLEAVES. Date of Service: 03/12/2018 1:30 PM Medical Record Number: [id number] Patient Account Number: [id number] Date of Birth/Sex: 1946/11/05 (72 y.o. F) Treating RN: Roger Shelter Primary Care Ab Leaming: [name] Other Clinician: Referring Luka Stohr: [name] Treating Nathanel Tallman/Extender: Tito Dine in Treatment: 3 Active Problems Location of Pain Severity and Description of Pain Patient Has Paino No Site Locations Pain Management and Medication Current Pain Management: Electronic Signature(s) Signed: 03/12/2018 4:46:18 PM By: Roger Shelter Entered By: Roger Shelter on 03/12/2018 13:35:37 Thivierge, [name] ([id number]) -------------------------------------------------------------------------------- Patient/Caregiver Education Details Patient Name: SHAKYIA, BOSSO. Date of Service: 03/12/2018 1:30 PM Medical Record Number: [id number] Patient Account Number: [id number] Date of Birth/Gender: 09/01/1946 (71  y.o. F) Treating RN: Montey Hora Primary Care Physician: [name] Other Clinician: Referring Physician: [name] Treating Physician/Extender: Tito Dine in Treatment: 3 Education Assessment Education Provided To: Patient Education Topics Provided Venous: Handouts: Other: leg elevation Methods: Explain/Verbal Responses: State content correctly Electronic Signature(s) Signed: 03/13/2018 4:34:09 PM By: Montey Hora Entered By: Montey Hora on 03/12/2018 15:12:24 Cobarrubias, [name] ([id number]) -------------------------------------------------------------------------------- Wound Assessment Details Patient Name: BETSAIDA, [place]. Date of Service: 03/12/2018 1:30 PM Medical Record Number: [id number] Patient Account Number: [id number] Date of Birth/Sex: 1945/12/19 (72 y.o. F) Treating RN: Roger Shelter Primary Care Dennisse Swader: [name] Other Clinician: Referring Ubah Radke: [name] Treating Perline Awe/Extender: Tito Dine in Treatment: 3 Wound Status Wound Number: 4 Primary Venous Leg Ulcer Etiology: Wound Location: Left Lower Leg - Lateral Wound Open Wounding Event: Gradually Appeared Status: Date Acquired: 02/05/2018 Comorbid Cataracts, Asthma, Sleep Apnea, Deep Vein Weeks Of Treatment: 3 History: Thrombosis, Hypertension, Peripheral Venous Clustered Wound: No Disease, Osteoarthritis, Received Chemotherapy, Received Radiation Photos Photo Uploaded By: [name], [name], RN, CWS, Kim on 03/13/2018 15:47:33 Wound Measurements Length: (cm) 0.7 Width: (cm) 0.5 Depth: (cm) 0.1 Area: (cm) 0.275 Volume: (cm) 0.027 % Reduction in Area: 51.3% % Reduction in Volume: 52.6% Epithelialization: Small (1-33%) Tunneling: No Undermining: No Wound Description Full Thickness Without Exposed Support Classification: Structures Wound Margin: Flat and Intact Exudate Medium Amount: Exudate Type: Serosanguineous Exudate Color: red, brown Foul Odor After Cleansing: No Slough/Fibrino Yes Wound Bed Granulation Amount: None Present (0%) Exposed  Structure Necrotic Amount: Large (67-100%) Fascia Exposed: No Necrotic Quality: Adherent Slough Fat Layer (Subcutaneous Tissue) Exposed: No Tendon Exposed: No Muscle Exposed: No Joint Exposed: No Fittro, Dariane J. ([id number]) Bone Exposed: No Periwound Skin Texture Texture Color No Abnormalities Noted: No No Abnormalities Noted: No Moisture Temperature / Pain No Abnormalities Noted: No Temperature: No Abnormality Dry / Scaly: Yes Tenderness on Palpation: Yes Maceration: No Wound Preparation Ulcer Cleansing: Rinsed/Irrigated with Saline Topical Anesthetic Applied: Other: lidocaine 4%, Treatment Notes Wound #4 (Left, Lateral Lower Leg) 1. Cleansed with: Clean wound with Normal Saline Cleanse wound with antibacterial soap and water 2. Anesthetic Topical Lidocaine 4% cream to wound bed prior to debridement 4. Dressing Applied: Hydrafera Blue 5. Secondary Dressing Applied ABD Pad 7. Secured with 3 Layer Compression System - Left Lower Extremity Notes unna to anchor Electronic Signature(s) Signed: 03/12/2018 4:46:18 PM By: Roger Shelter Entered By: Roger Shelter on 03/12/2018 13:50:00 RUMOR, SUN ([id number]) -------------------------------------------------------------------------------- Vitals Details Patient Name: ZILDA, NO. Date of Service: 03/12/2018 1:30 PM Medical Record Number: [id number] Patient Account Number: [id number] Date of Birth/Sex: [birth year] (72 y.o. F) Treating RN: Roger Shelter Primary Care Harla Mensch: [name] Other Clinician: Referring Cassius Cullinane: [name] Treating Klaudia Beirne/Extender: Tito Dine in Treatment: 3 Vital Signs Time Taken: 13:35 Temperature (F): 98.2 Height (in): 63 Pulse (bpm): 72 Weight (lbs): 219.9 Respiratory Rate (breaths/min): 18 Body Mass Index (BMI): 38.9 Blood Pressure (mmHg): 136/78 Reference Range: 80 - 120 mg / dl Electronic Signature(s) Signed: 03/12/2018 4:46:18 PM By:  Roger Shelter Entered By: Roger Shelter on 03/12/2018 13:35:57

## 2018-03-15 NOTE — Progress Notes (Signed)
[name], [name] ([id number]) Visit Report for 03/12/2018 Debridement Details Patient Name: [name], [name]. Date of Service: 03/12/2018 1:30 PM Medical Record Number: [id number] Patient Account Number: [id number] Date of Birth/Sex: 1946-03-06 (72 y.o. F) Treating RN: Cornell Barman Primary Care Provider: [name] Other Clinician: Referring Provider: [name] Treating Provider/Extender: Tito Dine in Treatment: 3 Debridement Performed for Wound #4 Left,Lateral Lower Leg Assessment: Performed By: Physician [name] Dillon, MD Debridement Type: Debridement Severity of Tissue Pre Fat layer exposed Debridement: Pre-procedure Verification/Time Yes - 14:01 Out Taken: Start Time: 14:01 Pain Control: Other : lidocane 4% Total Area Debrided (L x W): 0.3 (cm) x 0.2 (cm) = 0.06 (cm) Tissue and other material Viable, Eschar, Subcutaneous debrided: Level: Skin/Subcutaneous Tissue Debridement Description: Excisional Instrument: Curette Bleeding: None End Time: 14:03 Procedural Pain: 0 Post Procedural Pain: 0 Response to Treatment: Procedure was tolerated well Level of Consciousness: Awake and Alert Post Procedure Vitals: Temperature: 98.2 Pulse: 72 Respiratory Rate: 16 Blood Pressure: Systolic Blood Pressure: 188 Diastolic Blood Pressure: 78 Post Debridement Measurements of Total Wound Length: (cm) 0.2 Width: (cm) 0.3 Depth: (cm) 0.2 Volume: (cm) 0.009 Character of Wound/Ulcer Post Debridement: Stable Severity of Tissue Post Debridement: Fat layer exposed Post Procedure Diagnosis Same as Pre-procedure Electronic Signature(s) [name], [name] ([id number]) Signed: 03/12/2018 5:47:31 PM By: [name], [name], RN, CWS, [name], [name] Signed: 03/13/2018 1:16:01 PM By: Linton Ham MD Entered By: Linton Ham on 03/12/2018 14:30:33 [name], [name] ([id number]) -------------------------------------------------------------------------------- HPI  Details Patient Name: [name], [name]. Date of Service: 03/12/2018 1:30 PM Medical Record Number: [id number] Patient Account Number: [id number] Date of Birth/Sex: Oct 11, 1946 (72 y.o. F) Treating RN: Cornell Barman Primary Care Provider: [name] Other Clinician: Referring Provider: [name] Treating Provider/Extender: Tito Dine in Treatment: 3 History of Present Illness HPI Description: Pleasant 72 year old with history of chronic venous insufficiency. No diabetes or peripheral vascular disease. Left ABI 1.29. Questionable history of left lower extremity DVT. She developed a recurrent ulceration on her left lateral calf in December 2015, which she attributes to poor diet and subsequent lower extremity edema. She underwent endovenous laser ablation of her left greater saphenous vein in 2010. She underwent laser ablation of accessory branch of left GSV in April 2016 by Dr. Kellie Simmering at [facility]. She was previously wearing Unna boots, which she tolerated well. Tolerating 2 layer compression and cadexomer iodine. She returns to clinic for follow-up and is without new complaints. She denies any significant pain at this time. She reports persistent pain with pressure. No claudication or ischemic rest pain. No fever or chills. No drainage. READMISSION 11/13/16; this is a 72 year old woman who is not a diabetic. She is here for a review of a painful area on her left medial lower extremity. I note that she was seen here previously last year for wound I believe to be in the same area. At that time she had undergone previously a left greater saphenous vein ablation by Dr. Kellie Simmering and she had a ablation of the anterior accessory branch of the left greater saphenous vein in March 2016. Seeing that the wound actually closed over. In reviewing the history with her today the ulcer in this area has been recurrent. She describes a biopsy of this area in 2009 that only showed  stasis physiology. 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 Lutricia Feil of oncology and in August of this year she had surgery for cervical  spinal stenosis which left her with an improving Horner's syndrome on the left eye. Do not see that she has ever had arterial studies in the left leg. She tells me she has a follow-up with Dr. Kellie Simmering in roughly 10 days In any case she developed the reopening of this area roughly a month ago. On the background of this she describes rapidly increasing edema which has responded to Lasix 40 mg and metolazone 2.5 mg as well as the patient's lymph massage. She has been told she has both venous insufficiency and lymphedema but she cannot tolerate compression stockings 11/28/16; the patient saw Dr. Kellie Simmering recently. Per the patient he did arterial Dopplers in the office that did not show evidence of arterial insufficiency, per the patient he stated "treat this like an ordinary venous ulcer". She also saw her dermatologist Dr. Ronnald Ramp who felt that this was more of a vascular ulcer. In general things are improving although she arrives today with increasing bilateral lower extremity edema with weeping a deeper fluid through the wound on the left medial leg compatible with some degree of lymphedema 12/04/16; the patient's wound is fully epithelialized but I don't think fully healed. We will do another week of depression with Promogran and TCA however I suspect we'll be able to discharge her next week. This is a very unusual-looking wound which was initially a figure-of-eight type wound lying on its side surrounded by petechial like hemorrhage. She has had venous ablation on this side. She apparently does not have an arterial issue per Dr. Kellie Simmering. She saw her dermatologist thought it was "vascular". Patient is definitely going to need ongoing compression and I talked about this with her today she will go to elastic  therapy after she leaves here next week 12/11/16; the patient's wound is not completely closed today. She has surrounding scar tissue and in further discussion with the patient it would appear that she had ulcers in this area in 2009 for a prolonged period of time ultimately requiring a punch biopsy of this area that only showed venous insufficiency. I did not previously pickup on this part of the history from the patient. 12/18/16; the patient's wound is completely epithelialized. There is no open area here. She has significant bilateral venous insufficiency with secondary lymphedema to a mild-to-moderate degree she does not have compression stockings.. She did not say anything to me when I was in the room, she told our intake nurse that she was still having pain in this area. This isn't unusual recurrent small open area. She is going to go to elastic therapy to obtain compression stockings. 12/25/16; the patient's wound is fully epithelialized. There is no open area here. The patient describes some continued episodic discomfort in this area medial left calf. However everything looks fine and healed here. She is been to elastic therapy and caught herself 15-20 mmHg stockings, they apparently were having trouble getting 20-30 mm stockings in her size [name], [name] ([id number]) 01/22/17; this is a patient we discharged from the clinic a month ago. She has a recurrent open wound on her medial left calf. She had 15 mm support stockings. I told her I thought she needed 20-30 mm compression stockings. She tells me that she has been ill with hospitalization secondary to asthma and is been found to have severe hypokalemia likely secondary to a combination of Lasix and metolazone. This morning she noted blistering and leaking fluid on the posterior part of her left leg. She called our intake nurse urgently  and we was saw her this afternoon. She has not had any real discomfort here. I don't know that she's  been wearing any stockings on this leg for at least 2-3 days. ABIs in this clinic were 1.21 on the right and 1.3 on the left. She is previously seen vascular surgery who does not think that there is a peripheral arterial issue. 01/30/17; Patient arrives with no open wound on the left leg. She has been to elastic therapy and obtained 20-65mmhg below knee stockings and she has one on the right leg today. READMISSION 02/19/18; this [place] is a now 72 year old patient we've had in this clinic perhaps 3 times before. I had last looked at her from January 07 December 2016 with an area on the medial left leg. We discharged her on 12/25/16 however she had to be readmitted on 01/22/17 with a recurrence. I have in my notes that we discharged her on 20-30 mm stockings although she tells me she was only wearing support hose because she cannot get stockings on predominantly related to her cervical spine surgery/issues. She has had previous ablations done by vein and vascular in [place] including a great saphenous vein ablation on the left with an anterior accessory branch ablation I think both of these were in 2016. On one of the previous visit she had a biopsy noted 2009 that was negative. She is not felt to have an arterial issue. She is not a diabetic. She does have a history of obstructive sleep apnea hypertension asthma as well as chronic venous insufficiency and lymphedema. On this occasion she noted 2 dry scaly patch on her left leg. She tried to put lotion on this it didn't really help. There were 2 open areas.the patient has been seeing her primary physician from 02/05/18 through 02/14/18. She had Unna boots applied. The superior wound now on the lateral left leg has closed but she's had one wound that remains open on the lateral left leg. This is not the same spot as we dealt with in 2018. ABIs in this clinic were 1.3 bilaterally 02/26/18; patient has a small wound on the left lateral calf. Dimensions are  down. She has chronic venous insufficiency and lymphedema. 03/05/18; small open area on the left lateral calf. Dimensions are down. Tightly adherent necrotic debris over the surface of the wound which was difficult to remove. Also the dressing [over collagen] stuck to the wound surface. This was removed with some difficulty as well. Change the primary dressing to Hydrofera Blue ready 03/12/18; small open area on the left lateral calf. Comes in with tightly adherent surface eschar as well as some adherent Hydrofera Blue. Electronic Signature(s) Signed: 03/13/2018 1:16:01 PM By: Linton Ham MD Entered By: Linton Ham on 03/12/2018 14:45:51 Harlacher, [name] ([id number]) -------------------------------------------------------------------------------- Physical Exam Details Patient Name: DODY, SMARTT. Date of Service: 03/12/2018 1:30 PM Medical Record Number: [id number] Patient Account Number: [id number] Date of Birth/Sex: 02/04/1946 (72 y.o. F) Treating RN: Cornell Barman Primary Care Provider: [name] Other Clinician: Referring Provider: [name] Treating Provider/Extender: Tito Dine in Treatment: 3 Notes wound exam; the questions on the left lateral leg. This is smaller but once again covered by a tightly adherent eschar. Adherent Hydrofera Blue over the top of that and some mild amount of subcutaneous tissue. This was gently debrided with a #3 curette after we failed to wash all of this off the surface of the wound. There is no evidence of surrounding infection. Electronic Signature(s) Signed: 03/13/2018 1:16:01  PM By: Linton Ham MD Entered By: Linton Ham on 03/12/2018 14:47:01 Santore, [name] ([id number]) -------------------------------------------------------------------------------- Physician Orders Details Patient Name: HAZELYN, KALLEN. Date of Service: 03/12/2018 1:30 PM Medical Record Number: [id number] Patient Account Number: [id number] Date of  Birth/Sex: [birth year] (72 y.o. F) Treating RN: Cornell Barman Primary Care Provider: [name] Other Clinician: Referring Provider: [name] Treating Provider/Extender: Tito Dine in Treatment: 3 Verbal / Phone Orders: No Diagnosis Coding Wound Cleansing Wound #4 Left,Lateral Lower Leg o Clean wound with Normal Saline. o May Shower, gently pat wound dry prior to applying new dressing. Anesthetic (add to Medication List) Wound #4 Left,Lateral Lower Leg o Topical Lidocaine 4% cream applied to wound bed prior to debridement (In Clinic Only). o Benzocaine Topical Anesthetic Spray applied to wound bed prior to debridement (In Clinic Only). Primary Wound Dressing Wound #4 Left,Lateral Lower Leg o Hydrafera Blue Ready Transfer Secondary Dressing Wound #4 Left,Lateral Lower Leg o ABD pad Dressing Change Frequency Wound #4 Left,Lateral Lower Leg o Change dressing every week Follow-up Appointments Wound #4 Left,Lateral Lower Leg o Return Appointment in 1 week. o Nurse Visit as needed Edema Control Wound #4 Left,Lateral Lower Leg o 3 Layer Compression System - Left Lower Extremity Electronic Signature(s) Signed: 03/12/2018 5:47:31 PM By: [name], [name], RN, CWS, [name], [name] Signed: 03/13/2018 1:16:01 PM By: Linton Ham MD Entered By: [name], [name], RN, CWS, Kim on 03/12/2018 14:04:09 [name], [name] ([id number]) -------------------------------------------------------------------------------- Problem List Details Patient Name: [name], [name]. Date of Service: 03/12/2018 1:30 PM Medical Record Number: [id number] Patient Account Number: [id number] Date of Birth/Sex: 01-Dec-1945 (72 y.o. F) Treating RN: Cornell Barman Primary Care Provider: [name] Other Clinician: Referring Provider: [name] Treating Provider/Extender: Tito Dine in Treatment: 3 Active Problems ICD-10 Impacting Encounter Code Description Active  Date Wound Healing Diagnosis L97.228 Non-pressure chronic ulcer of left calf with other specified 02/19/2018 Yes severity I89.0 Lymphedema, not elsewhere classified 02/19/2018 Yes I87.332 Chronic venous hypertension (idiopathic) with ulcer and 02/19/2018 Yes inflammation of left lower extremity Inactive Problems Resolved Problems Electronic Signature(s) Signed: 03/13/2018 1:16:01 PM By: Linton Ham MD Entered By: Linton Ham on 03/12/2018 14:27:23 [name] ([id number]) -------------------------------------------------------------------------------- Progress Note Details Patient Name: [name], [name]. Date of Service: 03/12/2018 1:30 PM Medical Record Number: [id number] Patient Account Number: [id number] Date of Birth/Sex: October 01, 1946 (72 y.o. F) Treating RN: Cornell Barman Primary Care Provider: [name] Other Clinician: Referring Provider: [name] Treating Provider/Extender: Tito Dine in Treatment: 3 Subjective History of Present Illness (HPI) Pleasant 72 year old with history of chronic venous insufficiency. No diabetes or peripheral vascular disease. Left ABI 1.29. Questionable history of left lower extremity DVT. She developed a recurrent ulceration on her left lateral calf in December 2015, which she attributes to poor diet and subsequent lower extremity edema. She underwent endovenous laser ablation of her left greater saphenous vein in 2010. She underwent laser ablation of accessory branch of left GSV in April 2016 by Dr. Kellie Simmering at [facility]. She was previously wearing Unna boots, which she tolerated well. Tolerating 2 layer compression and cadexomer iodine. She returns to clinic for follow-up and is without new complaints. She denies any significant pain at this time. She reports persistent pain with pressure. No claudication or ischemic rest pain. No fever or chills. No drainage. READMISSION 11/13/16; this is a 72 year old woman who is not  a diabetic. She is here for a review of a painful area on her left medial lower extremity. I  note that she was seen here previously last year for wound I believe to be in the same area. At that time she had undergone previously a left greater saphenous vein ablation by Dr. Kellie Simmering and she had a ablation of the anterior accessory branch of the left greater saphenous vein in March 2016. Seeing that the wound actually closed over. In reviewing the history with her today the ulcer in this area has been recurrent. She describes a biopsy of this area in 2009 that only showed stasis physiology. 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 Do not see that she has ever had arterial studies in the left leg. She tells me she has a follow-up with Dr. Kellie Simmering in roughly 10 days In any case she developed the reopening of this area roughly a month ago. On the background of this she describes rapidly increasing edema which has responded to Lasix 40 mg and metolazone 2.5 mg as well as the patient's lymph massage. She has been told she has both venous insufficiency and lymphedema but she cannot tolerate compression stockings 11/28/16; the patient saw Dr. Kellie Simmering recently. Per the patient he did arterial Dopplers in the office that did not show evidence of arterial insufficiency, per the patient he stated "treat this like an ordinary venous ulcer". She also saw her dermatologist Dr. Ronnald Ramp who felt that this was more of a vascular ulcer. In general things are improving although she arrives today with increasing bilateral lower extremity edema with weeping a deeper fluid through the wound on the left medial leg compatible with some degree of lymphedema 12/04/16; the patient's wound is fully epithelialized but  I don't think fully healed. We will do another week of depression with Promogran and TCA however I suspect we'll be able to discharge her next week. This is a very unusual-looking wound which was initially a figure-of-eight type wound lying on its side surrounded by petechial like hemorrhage. She has had venous ablation on this side. She apparently does not have an arterial issue per Dr. Kellie Simmering. She saw her dermatologist thought it was "vascular". Patient is definitely going to need ongoing compression and I talked about this with her today she will go to elastic therapy after she leaves here next week 12/11/16; the patient's wound is not completely closed today. She has surrounding scar tissue and in further discussion with the patient it would appear that she had ulcers in this area in 2009 for a prolonged period of time ultimately requiring a punch biopsy of this area that only showed venous insufficiency. I did not previously pickup on this part of the history from the patient. 12/18/16; the patient's wound is completely epithelialized. There is no open area here. She has significant bilateral venous insufficiency with secondary lymphedema to a mild-to-moderate degree she does not have compression stockings.. She did not say anything to me when I was in the room, she told our intake nurse that she was still having pain in this area. This isn't unusual recurrent small open area. She is going to go to elastic therapy to obtain compression stockings. 12/25/16; the patient's wound is fully epithelialized. There is no open area here. The patient describes some continued episodic discomfort in this area medial left calf. However everything looks fine and  healed here. She is been to elastic therapy and [name], [name] ([id number]) caught herself 15-20 mmHg stockings, they apparently were having trouble getting 20-30 mm stockings in her size 01/22/17; this is a patient we discharged from the clinic a month  ago. She has a recurrent open wound on her medial left calf. She had 15 mm support stockings. I told her I thought she needed 20-30 mm compression stockings. She tells me that she has been ill with hospitalization secondary to asthma and is been found to have severe hypokalemia likely secondary to a combination of Lasix and metolazone. This morning she noted blistering and leaking fluid on the posterior part of her left leg. She called our intake nurse urgently and we was saw her this afternoon. She has not had any real discomfort here. I don't know that she's been wearing any stockings on this leg for at least 2-3 days. ABIs in this clinic were 1.21 on the right and 1.3 on the left. She is previously seen vascular surgery who does not think that there is a peripheral arterial issue. 01/30/17; Patient arrives with no open wound on the left leg. She has been to elastic therapy and obtained 20-96mmhg below knee stockings and she has one on the right leg today. READMISSION 02/19/18; this [place] is a now 72 year old patient we've had in this clinic perhaps 3 times before. I had last looked at her from January 07 December 2016 with an area on the medial left leg. We discharged her on 12/25/16 however she had to be readmitted on 01/22/17 with a recurrence. I have in my notes that we discharged her on 20-30 mm stockings although she tells me she was only wearing support hose because she cannot get stockings on predominantly related to her cervical spine surgery/issues. She has had previous ablations done by vein and vascular in [place] including a great saphenous vein ablation on the left with an anterior accessory branch ablation I think both of these were in 2016. On one of the previous visit she had a biopsy noted 2009 that was negative. She is not felt to have an arterial issue. She is not a diabetic. She does have a history of obstructive sleep apnea hypertension asthma as well as chronic venous  insufficiency and lymphedema. On this occasion she noted 2 dry scaly patch on her left leg. She tried to put lotion on this it didn't really help. There were 2 open areas.the patient has been seeing her primary physician from 02/05/18 through 02/14/18. She had Unna boots applied. The superior wound now on the lateral left leg has closed but she's had one wound that remains open on the lateral left leg. This is not the same spot as we dealt with in 2018. ABIs in this clinic were 1.3 bilaterally 02/26/18; patient has a small wound on the left lateral calf. Dimensions are down. She has chronic venous insufficiency and lymphedema. 03/05/18; small open area on the left lateral calf. Dimensions are down. Tightly adherent necrotic debris over the surface of the wound which was difficult to remove. Also the dressing [over collagen] stuck to the wound surface. This was removed with some difficulty as well. Change the primary dressing to Hydrofera Blue ready 03/12/18; small open area on the left lateral calf. Comes in with tightly adherent surface eschar as well as some adherent Hydrofera Blue. Objective Constitutional Vitals Time Taken: 1:35 PM, Height: 63 in, Weight: 219.9 lbs, BMI: 38.9, Temperature: 98.2 F, Pulse: 72 bpm, Respiratory Rate:  18 breaths/min, Blood Pressure: 136/78 mmHg. Integumentary (Hair, Skin) Wound #4 status is Open. Original cause of wound was Gradually Appeared. The wound is located on the Left,Lateral Lower Leg. The wound measures 0.7cm length x 0.5cm width x 0.1cm depth; 0.275cm^2 area and 0.027cm^3 volume. There is no tunneling or undermining noted. There is a medium amount of serosanguineous drainage noted. The wound margin is flat and intact. There is no granulation within the wound bed. There is a large (67-100%) amount of necrotic tissue within the wound bed including Adherent Slough. The periwound skin appearance exhibited: Dry/Scaly. The periwound skin appearance did  not exhibit: Maceration. Periwound temperature was noted as No Abnormality. The periwound has tenderness on palpation. [name], [name] ([id number]) Assessment Active Problems ICD-10 [phone] - Non-pressure chronic ulcer of left calf with other specified severity I89.0 - Lymphedema, not elsewhere classified I87.332 - Chronic venous hypertension (idiopathic) with ulcer and inflammation of left lower extremity Procedures Wound #4 Pre-procedure diagnosis of Wound #4 is a Venous Leg Ulcer located on the Left,Lateral Lower Leg .Severity of Tissue Pre Debridement is: Fat layer exposed. There was a Excisional Skin/Subcutaneous Tissue Debridement with a total area of 0.06 sq cm performed by [name] Dillon, MD. With the following instrument(s): Curette. to remove Viable tissue/material Material removed includes Eschar and Subcutaneous Tissue and after achieving pain control using Other (lidocane 4%). No specimens were taken. A time out was conducted at 14:01, prior to the start of the procedure. There was no bleeding. The procedure was tolerated well with a pain level of 0 throughout and a pain level of 0 following the procedure. Patient s Level of Consciousness post procedure was recorded as Awake and Alert and post-procedure vitals were taken including Temperature: 98.2 F, Pulse: 72 bpm, Respiratory Rate: 16 breaths/min, Blood Pressure: (136)/(78) mmHg. Post Debridement Measurements: 0.2cm length x 0.3cm width x 0.2cm depth; 0.009cm^3 volume. Character of Wound/Ulcer Post Debridement is stable. Severity of Tissue Post Debridement is: Fat layer exposed. Post procedure Diagnosis Wound #4: Same as Pre-Procedure Plan Wound Cleansing: Wound #4 Left,Lateral Lower Leg: Clean wound with Normal Saline. May Shower, gently pat wound dry prior to applying new dressing. Anesthetic (add to Medication List): Wound #4 Left,Lateral Lower Leg: Topical Lidocaine 4% cream applied to wound bed prior to debridement  (In Clinic Only). Benzocaine Topical Anesthetic Spray applied to wound bed prior to debridement (In Clinic Only). Primary Wound Dressing: Wound #4 Left,Lateral Lower Leg: Hydrafera Blue Ready Transfer Secondary Dressing: Wound #4 Left,Lateral Lower Leg: ABD pad Dressing Change Frequency: Wound #4 Left,Lateral Lower Leg: Change dressing every week Follow-up Appointments: Wound #4 Left,Lateral Lower Leg: Return Appointment in 1 week. [name], [name] ([id number]) Nurse Visit as needed Edema Control: Wound #4 Left,Lateral Lower Leg: 3 Layer Compression System - Left Lower Extremity #1 post debridement the wound is measuring a lot smaller. Only a narrow linear wound. Base of this looks healthy. #2 I went with the [facility] again. #3 she is complaining about irritated areas under the wrap. She has absolutely no edema. I reduce compression to 3B #4 the patient is going to get her own stocking for the left leg to match the one on the right 20-30 Electronic Signature(s) Signed: 03/13/2018 1:16:01 PM By: Linton Ham MD Entered By: Linton Ham on 03/12/2018 14:48:31 Cuoco, [name] ([id number]) -------------------------------------------------------------------------------- SuperBill Details Patient Name: TOMEKIA, HELTON. Date of Service: 03/12/2018 Medical Record Number: [id number] Patient Account Number: [id number] Date of Birth/Sex: [birth year] (72 y.o. F) Treating RN: [name],  [place] Primary Care Provider: [name] Other Clinician: Referring Provider: [name] Treating Provider/Extender: Tito Dine in Treatment: 3 Diagnosis Coding ICD-10 Codes Code Description 2186394043 Non-pressure chronic ulcer of left calf with other specified severity I89.0 Lymphedema, not elsewhere classified I87.332 Chronic venous hypertension (idiopathic) with ulcer and inflammation of left lower extremity Facility Procedures CPT4 Code: [id number] Description: 03524 - DEB SUBQ  TISSUE 20 SQ CM/< ICD-10 Diagnosis Description L97.228 Non-pressure chronic ulcer of left calf with other specified Modifier: severity Quantity: 1 Physician Procedures CPT4 Code: 8185909 Description: 31121 - WC PHYS SUBQ TISS 20 SQ CM ICD-10 Diagnosis Description L97.228 Non-pressure chronic ulcer of left calf with other specified Modifier: severity Quantity: 1 Electronic Signature(s) Signed: 03/13/2018 1:16:01 PM By: Linton Ham MD Entered By: Linton Ham on 03/12/2018 14:49:02

## 2018-03-19 ENCOUNTER — Encounter: Payer: Medicare Other | Admitting: Internal Medicine

## 2018-03-19 DIAGNOSIS — I87312 Chronic venous hypertension (idiopathic) with ulcer of left lower extremity: Secondary | ICD-10-CM | POA: Diagnosis not present

## 2018-03-19 DIAGNOSIS — G4733 Obstructive sleep apnea (adult) (pediatric): Secondary | ICD-10-CM | POA: Diagnosis not present

## 2018-03-19 DIAGNOSIS — L97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d: Secondary | ICD-10-CM | POA: Diagnosis not present

## 2018-03-19 DIAGNOSIS — L97222 Non-pressure chronic ulcer of left calf with fat layer exposed: Secondary | ICD-10-CM | POA: Diagnosis not present

## 2018-03-19 DIAGNOSIS — I10 Essential (primary) hypertension: Secondary | ICD-10-CM | POA: Diagnosis not present

## 2018-03-19 DIAGNOSIS — J45909 Unspecified asthma, uncomplicated: Secondary | ICD-10-CM | POA: Diagnosis not present

## 2018-03-19 DIAGNOSIS — I87332 Chronic venous hypertension (idiopathic) with ulcer and inflammation of left lower extremity: Secondary | ICD-10-CM | POA: Diagnosis not present

## 2018-03-19 DIAGNOSIS — I890 Lymphedema, not elsewhere classified: Secondary | ICD-10-CM | POA: Diagnosis not present

## 2018-03-21 NOTE — Progress Notes (Signed)
ALFHILD, PARTCH ([id number]) Visit Report for 03/19/2018 Arrival Information Details Patient Name: [name], [name]. Date of Service: 03/19/2018 1:00 PM Medical Record Number: [id number] Patient Account Number: [id number] Date of Birth/Sex: 13-[name]-1946 (72 y.o. F) Treating RN: Roger Shelter Primary Care Andree Heeg: [name] Other Clinician: Referring Capri Raben: [name] Treating Bobak Oguinn/Extender: Tito Dine in Treatment: 4 Visit Information History Since Last Visit All ordered tests and consults were completed: No Patient Arrived: Ambulatory Added or deleted any medications: No Arrival Time: 12:48 Any new allergies or adverse reactions: No Accompanied By: self Had a fall or experienced change in No Transfer Assistance: None activities of daily living that may affect Patient Identification Verified: Yes risk of falls: Secondary Verification Process Completed: Yes Signs or symptoms of abuse/neglect since last visito No Patient Requires Transmission-Based No Hospitalized since last visit: No Precautions: Implantable device outside of the clinic excluding No Patient Has Alerts: No cellular tissue based products placed in the center since last visit: Pain Present Now: Yes Electronic Signature(s) Signed: 03/19/2018 3:23:03 PM By: Roger Shelter Entered By: Roger Shelter on 03/19/2018 12:49:55 Defibaugh, [name] ([id number]) -------------------------------------------------------------------------------- Encounter Discharge Information Details Patient Name: Amber, [name]. Date of Service: 03/19/2018 1:00 PM Medical Record Number: [id number] Patient Account Number: [id number] Date of Birth/Sex: [birth year] (72 y.o. F) Treating RN: Montey Hora Primary Care Indiya Izquierdo: [name] Other Clinician: Referring Cayci Mcnabb: [name] Treating Emelie Newsom/Extender: Tito Dine in Treatment: 4 Encounter Discharge Information  Items Discharge Condition: Stable Ambulatory Status: Ambulatory Discharge Destination: Home Transportation: Private Auto Accompanied By: self Schedule Follow-up Appointment: Yes Clinical Summary of Care: Electronic Signature(s) Signed: 03/19/2018 3:52:19 PM By: Montey Hora Entered By: Montey Hora on 03/19/2018 15:52:19 Szilagyi, [name] ([id number]) -------------------------------------------------------------------------------- Lower Extremity Assessment Details Patient Name: Amber, [name]. Date of Service: 03/19/2018 1:00 PM Medical Record Number: [id number] Patient Account Number: [id number] Date of Birth/Sex: 20-Jul-1946 (72 y.o. F) Treating RN: Roger Shelter Primary Care Ac Colan: [name] Other Clinician: Referring Varian Innes: [name] Treating Courtni Balash/Extender: Tito Dine in Treatment: 4 Edema Assessment Assessed: [Left: No] [Right: No] [Left: Edema] [Right: :] Calf Left: Right: Point of Measurement: 32 cm From Medial Instep 45 cm cm Ankle Left: Right: Point of Measurement: 12 cm From Medial Instep 23 cm cm Vascular Assessment Claudication: Claudication Assessment [Left:None] Pulses: Dorsalis Pedis Palpable: [Left:Yes] Posterior Tibial Extremity colors, hair growth, and conditions: Extremity Color: [Left:Normal] Hair Growth on Extremity: [Left:Yes] Temperature of Extremity: [Left:Warm] Capillary Refill: [Left:< 3 seconds] Toe Nail Assessment Left: Right: Thick: Yes Discolored: Yes Deformed: Yes Improper Length and Hygiene: Yes Electronic Signature(s) Signed: 03/19/2018 3:23:03 PM By: Roger Shelter Entered By: Roger Shelter on 03/19/2018 12:58:57 Albin, [name] ([id number]) -------------------------------------------------------------------------------- Multi Wound Chart Details Patient Name: [name], Amber. Date of Service: 03/19/2018 1:00 PM Medical Record Number: [id number] Patient Account Number:  [id number] Date of Birth/Sex: Sep 16, 1946 (72 y.o. F) Treating RN: Cornell Barman Primary Care Teren Franckowiak: [name] Other Clinician: Referring Jyrah Blye: [name] Treating Valin Massie/Extender: Tito Dine in Treatment: 4 Vital Signs Height(in): 63 Pulse(bpm): 61 Weight(lbs): 219.9 Blood Pressure(mmHg): 127/62 Body Mass Index(BMI): 39 Temperature(F): 98.7 Respiratory Rate 18 (breaths/min): Photos: [4:No Photos] [N/A:N/A] Wound Location: [4:Left Lower Leg - Lateral] [N/A:N/A] Wounding Event: [4:Gradually Appeared] [N/A:N/A] Primary Etiology: [4:Venous Leg Ulcer] [N/A:N/A] Comorbid History: [4:Cataracts, Asthma, Sleep Apnea, Deep Vein Thrombosis, Hypertension, Peripheral Venous Disease, Osteoarthritis, Received Chemotherapy, Received Radiation] [N/A:N/A] Date Acquired: [4:02/05/2018] [N/A:N/A] Weeks of Treatment: [4:4] [N/A:N/A] Wound Status: [4:Open] [N/A:N/A] Measurements L x W x D [  4:0.4x0.4x0.1] [N/A:N/A] (cm) Area (cm) : [4:0.126] [N/A:N/A] Volume (cm) : [4:0.013] [N/A:N/A] % Reduction in Area: [4:77.70%] [N/A:N/A] % Reduction in Volume: [4:77.20%] [N/A:N/A] Classification: [4:Full Thickness Without Exposed Support Structures] [N/A:N/A] Exudate Amount: [4:Medium] [N/A:N/A] Exudate Type: [4:Serosanguineous] [N/A:N/A] Exudate Color: [4:red, brown] [N/A:N/A] Wound Margin: [4:Flat and Intact] [N/A:N/A] Granulation Amount: [4:None Present (0%)] [N/A:N/A] Necrotic Amount: [4:Large (67-100%)] [N/A:N/A] Necrotic Tissue: [4:Eschar, Adherent Slough] [N/A:N/A] Exposed Structures: [4:Fascia: No Fat Layer (Subcutaneous Tissue) Exposed: No Tendon: No Muscle: No Joint: No Bone: No] [N/A:N/A] Epithelialization: [4:Small (1-33%)] [N/A:N/A] Debridement: Debridement - Excisional N/A N/A Pre-procedure 13:10 N/A N/A Verification/Time Out Taken: Pain Control: Other N/A N/A Tissue Debrided: Necrotic/Eschar, N/A N/A Subcutaneous Level: Skin/Subcutaneous Tissue N/A  N/A Debridement Area (sq cm): 0.16 N/A N/A Instrument: Curette N/A N/A Bleeding: Moderate N/A N/A Hemostasis Achieved: Pressure N/A N/A Procedural Pain: 1 N/A N/A Post Procedural Pain: 1 N/A N/A Debridement Treatment Procedure was tolerated well N/A N/A Response: Post Debridement 0.4x0.4x0.2 N/A N/A Measurements L x W x D (cm) Post Debridement Volume: 0.025 N/A N/A (cm) Periwound Skin Texture: No Abnormalities Noted N/A N/A Periwound Skin Moisture: Dry/Scaly: Yes N/A N/A Maceration: No Periwound Skin Color: No Abnormalities Noted N/A N/A Temperature: No Abnormality N/A N/A Tenderness on Palpation: Yes N/A N/A Wound Preparation: Ulcer Cleansing: N/A N/A Rinsed/Irrigated with Saline Topical Anesthetic Applied: Other: lidocaine 4% Procedures Performed: Debridement N/A N/A Treatment Notes Electronic Signature(s) Signed: 03/19/2018 4:50:03 PM By: Linton Ham MD Entered By: Linton Ham on 03/19/2018 13:18:04 Duecker, [name] ([id number]) -------------------------------------------------------------------------------- Multi-Disciplinary Care Plan Details Patient Name: MELVENA, [name]. Date of Service: 03/19/2018 1:00 PM Medical Record Number: [id number] Patient Account Number: [id number] Date of Birth/Sex: 29-Oct-1946 (72 y.o. F) Treating RN: Cornell Barman Primary Care British Moyd: [name] Other Clinician: Referring Johnnay Pleitez: [name] Treating Rashied Corallo/Extender: Tito Dine in Treatment: 4 Active Inactive ` Orientation to the Wound Care Program Nursing Diagnoses: Knowledge deficit related to the wound healing center program Goals: Patient/caregiver will verbalize understanding of the [facility] Date Initiated: 02/19/2018 Target Resolution Date: 03/21/2018 Goal Status: Active Interventions: Provide education on orientation to the wound center Notes: ` Venous Leg Ulcer Nursing Diagnoses: Actual venous Insuffiency (use after  diagnosis is confirmed) Goals: Patient will maintain optimal edema control Date Initiated: 02/19/2018 Target Resolution Date: 03/21/2018 Goal Status: Active Interventions: Compression as ordered Treatment Activities: Therapeutic compression applied : 02/19/2018 Notes: ` Wound/Skin Impairment Nursing Diagnoses: Impaired tissue integrity Goals: Ulcer/skin breakdown will have a volume reduction of 80% by week 12 Date Initiated: 02/19/2018 Target Resolution Date: 06/21/2018 KEITH, CANCIO ([id number]) Goal Status: Active Interventions: Assess ulceration(s) every visit Treatment Activities: Topical wound management initiated : 02/19/2018 Notes: Electronic Signature(s) Signed: 03/19/2018 4:52:44 PM By: [name], [name], RN, CWS, [name], [name] Entered By: [name], [name], RN, CWS, Kim on 03/19/2018 13:08:41 [name] ([id number]) -------------------------------------------------------------------------------- Pain Assessment Details Patient Name: DYASIA, FIRESTINE. Date of Service: 03/19/2018 1:00 PM Medical Record Number: [id number] Patient Account Number: [id number] Date of Birth/Sex: [birth year] (72 y.o. F) Treating RN: Roger Shelter Primary Care Kevona Lupinacci: [name] Other Clinician: Referring Lathyn Griggs: [name] Treating Chancy Claros/Extender: Tito Dine in Treatment: 4 Active Problems Location of Pain Severity and Description of Pain Patient Has Paino Yes Site Locations Pain Location: Pain in Ulcers Duration of the Pain. Constant / Intermittento Intermittent Rate the pain. Current Pain Level: 3 Character of Pain Describe the Pain: Burning, Other: stinging Pain Management and Medication Current Pain Management: Electronic Signature(s) Signed: 03/19/2018 3:23:03 PM By:  Flinchum, Cheryl Entered By: Roger Shelter on 03/19/2018 12:50:30 [name]  ([id number]) -------------------------------------------------------------------------------- Patient/Caregiver Education Details Patient Name: DENIJAH, KARRER. Date of Service: 03/19/2018 1:00 PM Medical Record Number: [id number] Patient Account Number: [id number] Date of Birth/Gender: 12/16/1945 (72 y.o. F) Treating RN: Montey Hora Primary Care Physician: [name] Other Clinician: Referring Physician: [name] Treating Physician/Extender: Tito Dine in Treatment: 4 Education Assessment Education Provided To: Patient Education Topics Provided Venous: Handouts: Other: leg elevation Methods: Explain/Verbal Responses: State content correctly Electronic Signature(s) Signed: 03/19/2018 4:31:04 PM By: Montey Hora Entered By: Montey Hora on 03/19/2018 15:53:07 Soderlund, [name] ([id number]) -------------------------------------------------------------------------------- Wound Assessment Details Patient Name: MINDEE, ROBLEDO. Date of Service: 03/19/2018 1:00 PM Medical Record Number: [id number] Patient Account Number: [id number] Date of Birth/Sex: 10/29/1946 (72 y.o. F) Treating RN: Roger Shelter Primary Care Salvatrice Morandi: [name] Other Clinician: Referring Zariah Jost: [name] Treating Shivansh Hardaway/Extender: Tito Dine in Treatment: 4 Wound Status Wound Number: 4 Primary Venous Leg Ulcer Etiology: Wound Location: Left Lower Leg - Lateral Wound Open Wounding Event: Gradually Appeared Status: Date Acquired: 02/05/2018 Comorbid Cataracts, Asthma, Sleep Apnea, Deep Vein Weeks Of Treatment: 4 History: Thrombosis, Hypertension, Peripheral Venous Clustered Wound: No Disease, Osteoarthritis, Received Chemotherapy, Received Radiation Photos Photo Uploaded By: Roger Shelter on 03/19/2018 15:46:24 Wound Measurements Length: (cm) 0.4 Width: (cm) 0.4 Depth: (cm) 0.1 Area: (cm) 0.126 Volume: (cm) 0.013 % Reduction in  Area: 77.7% % Reduction in Volume: 77.2% Epithelialization: Small (1-33%) Tunneling: No Undermining: No Wound Description Full Thickness Without Exposed Support Classification: Structures Wound Margin: Flat and Intact Exudate Medium Amount: Exudate Type: Serosanguineous Exudate Color: red, brown Foul Odor After Cleansing: No Slough/Fibrino Yes Wound Bed Granulation Amount: None Present (0%) Exposed Structure Necrotic Amount: Large (67-100%) Fascia Exposed: No Necrotic Quality: Eschar, Adherent Slough Fat Layer (Subcutaneous Tissue) Exposed: No Tendon Exposed: No Muscle Exposed: No Joint Exposed: No TOMARA, YOUNGBERG. ([id number]) Bone Exposed: No Periwound Skin Texture Texture Color No Abnormalities Noted: No No Abnormalities Noted: No Moisture Temperature / Pain No Abnormalities Noted: No Temperature: No Abnormality Dry / Scaly: Yes Tenderness on Palpation: Yes Maceration: No Wound Preparation Ulcer Cleansing: Rinsed/Irrigated with Saline Topical Anesthetic Applied: Other: lidocaine 4%, Treatment Notes Wound #4 (Left, Lateral Lower Leg) 1. Cleansed with: Cleanse wound with antibacterial soap and water 2. Anesthetic Topical Lidocaine 4% cream to wound bed prior to debridement 4. Dressing Applied: Other dressing (specify in notes) 5. Secondary Dressing Applied ABD Pad 7. Secured with 4-Layer Compression System - Left Lower Extremity Notes unna to anchor, silvercel Electronic Signature(s) Signed: 03/19/2018 3:23:03 PM By: Roger Shelter Entered By: Roger Shelter on 03/19/2018 13:00:59 KIKUE, GERHART ([id number]) -------------------------------------------------------------------------------- Vitals Details Patient Name: FARRA, NIKOLIC. Date of Service: 03/19/2018 1:00 PM Medical Record Number: [id number] Patient Account Number: [id number] Date of Birth/Sex: 1946-10-06 (72 y.o. F) Treating RN: Roger Shelter Primary Care Darbie Biancardi: [name]  Other Clinician: Referring Merland Holness: [name] Treating Jayelle Page/Extender: Tito Dine in Treatment: 4 Vital Signs Time Taken: 12:50 Temperature (F): 98.7 Height (in): 63 Pulse (bpm): 61 Weight (lbs): 219.9 Respiratory Rate (breaths/min): 18 Body Mass Index (BMI): 38.9 Blood Pressure (mmHg): 127/62 Reference Range: 80 - 120 mg / dl Electronic Signature(s) Signed: 03/19/2018 3:23:03 PM By: Roger Shelter Entered By: Roger Shelter on 03/19/2018 12:51:35

## 2018-03-21 NOTE — Progress Notes (Signed)
MOZETTA, MURFIN ([id number]) Visit Report for 03/19/2018 Debridement Details Patient Name: [name], [name]. Date of Service: 03/19/2018 1:00 PM Medical Record Number: [id number] Patient Account Number: [id number] Date of Birth/Sex: 08-24-1946 (72 y.o. F) Treating RN: Cornell Barman Primary Care Provider: [name] Other Clinician: Referring Provider: [name] Treating Provider/Extender: Tito Dine in Treatment: 4 Debridement Performed for Wound #4 Left,Lateral Lower Leg Assessment: Performed By: Physician [name] Dillon, MD Debridement Type: Debridement Severity of Tissue Pre Fat layer exposed Debridement: Pre-procedure Verification/Time Yes - 13:10 Out Taken: Start Time: 13:10 Pain Control: Other : lidocaine 4% Total Area Debrided (L x W): 0.4 (cm) x 0.4 (cm) = 0.16 (cm) Tissue and other material Non-Viable, Eschar, Subcutaneous debrided: Level: Skin/Subcutaneous Tissue Debridement Description: Excisional Instrument: Curette Bleeding: Moderate Hemostasis Achieved: Pressure End Time: 13:12 Procedural Pain: 1 Post Procedural Pain: 1 Response to Treatment: Procedure was tolerated well Level of Consciousness: Awake and Alert Post Procedure Vitals: Temperature: 98.7 Pulse: 61 Respiratory Rate: 18 Blood Pressure: Systolic Blood Pressure: 937 Diastolic Blood Pressure: 62 Post Debridement Measurements of Total Wound Length: (cm) 0.4 Width: (cm) 0.4 Depth: (cm) 0.2 Volume: (cm) 0.025 Character of Wound/Ulcer Post Debridement: Requires Further Debridement Severity of Tissue Post Debridement: Fat layer exposed Post Procedure Diagnosis Same as Pre-procedure [name], [name] ([id number]) Electronic Signature(s) Signed: 03/19/2018 4:50:03 PM By: Linton Ham MD Signed: 03/19/2018 4:52:44 PM By: [name], [name], RN, CWS, [name], [name] Entered By: Linton Ham on 03/19/2018 13:18:19 [name], [name]  ([id number]) -------------------------------------------------------------------------------- HPI Details Patient Name: [name], [name]. Date of Service: 03/19/2018 1:00 PM Medical Record Number: [id number] Patient Account Number: [id number] Date of Birth/Sex: [birth year] (72 y.o. F) Treating RN: Cornell Barman Primary Care Provider: [name] Other Clinician: Referring Provider: [name] Treating Provider/Extender: Tito Dine in Treatment: 4 History of Present Illness HPI Description: Pleasant 72 year old with history of chronic venous insufficiency. No diabetes or peripheral vascular disease. Left ABI 1.29. Questionable history of left lower extremity DVT. She developed a recurrent ulceration on her left lateral calf in December 2015, which she attributes to poor diet and subsequent lower extremity edema. She underwent endovenous laser ablation of her left greater saphenous vein in 2010. She underwent laser ablation of accessory branch of left GSV in April 2016 by Dr. Kellie Simmering at [facility]. She was previously wearing Unna boots, which she tolerated well. Tolerating 2 layer compression and cadexomer iodine. She returns to clinic for follow-up and is without new complaints. She denies any significant pain at this time. She reports persistent pain with pressure. No claudication or ischemic rest pain. No fever or chills. No drainage. READMISSION 11/13/16; this is a 72 year old woman who is not a diabetic. She is here for a review of a painful area on her left medial lower extremity. I note that she was seen here previously last year for wound I believe to be in the same area. At that time she had undergone previously a left greater saphenous vein ablation by Dr. Kellie Simmering and she had a ablation of the anterior accessory branch of the left greater saphenous vein in March 2016. Seeing that the wound actually closed over. In reviewing the history with her today the ulcer in this  area has been recurrent. She describes a biopsy of this area in 2009 that only showed stasis physiology. 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 Do not see that she has ever had arterial studies in the left leg. She tells me she has a follow-up with Dr. Kellie Simmering in roughly 10 days In any case she developed the reopening of this area roughly a month ago. On the background of this she describes rapidly increasing edema which has responded to Lasix 40 mg and metolazone 2.5 mg as well as the patient's lymph massage. She has been told she has both venous insufficiency and lymphedema but she cannot tolerate compression stockings 11/28/16; the patient saw Dr. Kellie Simmering recently. Per the patient he did arterial Dopplers in the office that did not show evidence of arterial insufficiency, per the patient he stated "treat this like an ordinary venous ulcer". She also saw her dermatologist Dr. Ronnald Ramp who felt that this was more of a vascular ulcer. In general things are improving although she arrives today with increasing bilateral lower extremity edema with weeping a deeper fluid through the wound on the left medial leg compatible with some degree of lymphedema 12/04/16; the patient's wound is fully epithelialized but I don't think fully healed. We will do another week of depression with Promogran and TCA however I suspect we'll be able to discharge her next week. This is a very unusual-looking wound which was initially a figure-of-eight type wound lying on its side surrounded by petechial like hemorrhage. She has had venous ablation on this side. She apparently does not have an arterial issue per Dr. Kellie Simmering. She saw her dermatologist thought it was "vascular". Patient is definitely going to need  ongoing compression and I talked about this with her today she will go to elastic therapy after she leaves here next week 12/11/16; the patient's wound is not completely closed today. She has surrounding scar tissue and in further discussion with the patient it would appear that she had ulcers in this area in 2009 for a prolonged period of time ultimately requiring a punch biopsy of this area that only showed venous insufficiency. I did not previously pickup on this part of the history from the patient. 12/18/16; the patient's wound is completely epithelialized. There is no open area here. She has significant bilateral venous insufficiency with secondary lymphedema to a mild-to-moderate degree she does not have compression stockings.. She did not say anything to me when I was in the room, she told our intake nurse that she was still having pain in this area. This isn't unusual recurrent small open area. She is going to go to elastic therapy to obtain compression stockings. 12/25/16; the patient's wound is fully epithelialized. There is no open area here. The patient describes some continued episodic discomfort in this area medial left calf. However everything looks fine and healed here. She is been to elastic therapy and caught herself 15-20 mmHg stockings, they apparently were having trouble getting 20-30 mm stockings in her size [name], [name] ([id number]) 01/22/17; this is a patient we discharged from the clinic a month ago. She has a recurrent open wound on her medial left calf. She had 15 mm support stockings. I told her I thought she needed 20-30 mm compression stockings. She tells me that she has been ill with hospitalization secondary to asthma and is been found to have severe hypokalemia likely secondary to a combination of Lasix and metolazone. This morning she noted blistering and leaking fluid on the posterior part of her left leg. She  called our intake nurse urgently and we was saw her  this afternoon. She has not had any real discomfort here. I don't know that she's been wearing any stockings on this leg for at least 2-3 days. ABIs in this clinic were 1.21 on the right and 1.3 on the left. She is previously seen vascular surgery who does not think that there is a peripheral arterial issue. 01/30/17; Patient arrives with no open wound on the left leg. She has been to elastic therapy and obtained 20-34mmhg below knee stockings and she has one on the right leg today. READMISSION 02/19/18; this Troung is a now 72 year old patient we've had in this clinic perhaps 3 times before. I had last looked at her from January 07 December 2016 with an area on the medial left leg. We discharged her on 12/25/16 however she had to be readmitted on 01/22/17 with a recurrence. I have in my notes that we discharged her on 20-30 mm stockings although she tells me she was only wearing support hose because she cannot get stockings on predominantly related to her cervical spine surgery/issues. She has had previous ablations done by vein and vascular in [place] including a great saphenous vein ablation on the left with an anterior accessory branch ablation I think both of these were in 2016. On one of the previous visit she had a biopsy noted 2009 that was negative. She is not felt to have an arterial issue. She is not a diabetic. She does have a history of obstructive sleep apnea hypertension asthma as well as chronic venous insufficiency and lymphedema. On this occasion she noted 2 dry scaly patch on her left leg. She tried to put lotion on this it didn't really help. There were 2 open areas.the patient has been seeing her primary physician from 02/05/18 through 02/14/18. She had Unna boots applied. The superior wound now on the lateral left leg has closed but she's had one wound that remains open on the lateral left leg. This is not the same spot as we dealt with in 2018. ABIs in this clinic were 1.3  bilaterally 02/26/18; patient has a small wound on the left lateral calf. Dimensions are down. She has chronic venous insufficiency and lymphedema. 03/05/18; small open area on the left lateral calf. Dimensions are down. Tightly adherent necrotic debris over the surface of the wound which was difficult to remove. Also the dressing [over collagen] stuck to the wound surface. This was removed with some difficulty as well. Change the primary dressing to Hydrofera Blue ready 03/12/18; small open area on the left lateral calf. Comes in with tightly adherent surface eschar as well as some adherent Hydrofera Blue. 03/19/18; open area on the left lateral calf. Again adherent surface eschar as well as some adherent Hydrofera Blue nonviable subcutaneous tissue. She complained of pain all week even with the reduction from 4-3 layer compression I put on last week. Also she had an increase in her ankle and calf measurements probably related to the same thing. Electronic Signature(s) Signed: 03/19/2018 4:50:03 PM By: Linton Ham MD Entered By: Linton Ham on 03/19/2018 13:19:21 [name], [name] ([id number]) -------------------------------------------------------------------------------- Physical Exam Details Patient Name: GRACELAND, WACHTER. Date of Service: 03/19/2018 1:00 PM Medical Record Number: [id number] Patient Account Number: [id number] Date of Birth/Sex: [birth year] (72 y.o. F) Treating RN: Cornell Barman Primary Care Provider: [name] Other Clinician: Referring Provider: [name] Treating Provider/Extender: [name] Dillon Weeks in Treatment: 4 Constitutional Sitting or standing Blood Pressure is within  target range for patient.. Pulse regular and within target range for patient.Marland Kitchen Respirations regular, non-labored and within target range.. Temperature is normal and within the target range for the patient.Marland Kitchen appears in no distress. Notes when exam; the area in question is on the  left lateral leg. Small area looking somewhat irritated with some degree of subdermal hemorrhage in spots around the wound. Again tightly adherent eschar and some subcutaneous tissue debrided. hemostasis with direct pressure.This is a small wound and I'm not really sure why this is forming. There is really no evidence of surrounding infection definitely less edema control. Electronic Signature(s) Signed: 03/19/2018 4:50:03 PM By: Linton Ham MD Entered By: Linton Ham on 03/19/2018 13:21:45 [name] ([id number]) -------------------------------------------------------------------------------- Physician Orders Details Patient Name: KEMARIA, DEDIC. Date of Service: 03/19/2018 1:00 PM Medical Record Number: [id number] Patient Account Number: [id number] Date of Birth/Sex: 09/16/1946 (72 y.o. F) Treating RN: Cornell Barman Primary Care Provider: [name] Other Clinician: Referring Provider: [name] Treating Provider/Extender: Tito Dine in Treatment: 4 Verbal / Phone Orders: No Diagnosis Coding Wound Cleansing Wound #4 Left,Lateral Lower Leg o Clean wound with Normal Saline. o May Shower, gently pat wound dry prior to applying new dressing. Anesthetic (add to Medication List) Wound #4 Left,Lateral Lower Leg o Topical Lidocaine 4% cream applied to wound bed prior to debridement (In Clinic Only). o Benzocaine Topical Anesthetic Spray applied to wound bed prior to debridement (In Clinic Only). Primary Wound Dressing Wound #4 Left,Lateral Lower Leg o Silver Alginate Secondary Dressing Wound #4 Left,Lateral Lower Leg o ABD pad Dressing Change Frequency Wound #4 Left,Lateral Lower Leg o Change dressing every week Follow-up Appointments Wound #4 Left,Lateral Lower Leg o Return Appointment in 1 week. o Nurse Visit as needed Edema Control Wound #4 Left,Lateral Lower Leg o 4-Layer Compression System - Left Lower  Extremity Electronic Signature(s) Signed: 03/19/2018 4:50:03 PM By: Linton Ham MD Signed: 03/19/2018 4:52:44 PM By: [name], [name], RN, CWS, [name], [name] Entered By: [name], [name], RN, CWS, Kim on 03/19/2018 13:12:58 [name], [name] ([id number]) -------------------------------------------------------------------------------- Problem List Details Patient Name: [name], [name]. Date of Service: 03/19/2018 1:00 PM Medical Record Number: [id number] Patient Account Number: [id number] Date of Birth/Sex: 04/05/1946 (72 y.o. F) Treating RN: Cornell Barman Primary Care Provider: [name] Other Clinician: Referring Provider: [name] Treating Provider/Extender: Tito Dine in Treatment: 4 Active Problems ICD-10 Impacting Encounter Code Description Active Date Wound Healing Diagnosis L97.228 Non-pressure chronic ulcer of left calf with other specified 02/19/2018 Yes severity I89.0 Lymphedema, not elsewhere classified 02/19/2018 Yes I87.332 Chronic venous hypertension (idiopathic) with ulcer and 02/19/2018 Yes inflammation of left lower extremity Inactive Problems Resolved Problems Electronic Signature(s) Signed: 03/19/2018 4:50:03 PM By: Linton Ham MD Entered By: Linton Ham on 03/19/2018 13:17:50 [name], [name] ([id number]) -------------------------------------------------------------------------------- Progress Note Details Patient Name: [name], [name]. Date of Service: 03/19/2018 1:00 PM Medical Record Number: [id number] Patient Account Number: [id number] Date of Birth/Sex: [birth year] (72 y.o. F) Treating RN: Cornell Barman Primary Care Provider: [name] Other Clinician: Referring Provider: [name] Treating Provider/Extender: Tito Dine in Treatment: 4 Subjective History of Present Illness (HPI) Pleasant 72 year old with history of chronic venous insufficiency. No diabetes or peripheral vascular disease. Left ABI  1.29. Questionable history of left lower extremity DVT. She developed a recurrent ulceration on her left lateral calf in December 2015, which she attributes to poor diet and subsequent lower extremity edema. She underwent endovenous laser ablation of her left greater saphenous vein in  2010. She underwent laser ablation of accessory branch of left GSV in April 2016 by Dr. Kellie Simmering at [facility]. She was previously wearing Unna boots, which she tolerated well. Tolerating 2 layer compression and cadexomer iodine. She returns to clinic for follow-up and is without new complaints. She denies any significant pain at this time. She reports persistent pain with pressure. No claudication or ischemic rest pain. No fever or chills. No drainage. READMISSION 11/13/16; this is a 72 year old woman who is not a diabetic. She is here for a review of a painful area on her left medial lower extremity. I note that she was seen here previously last year for wound I believe to be in the same area. At that time she had undergone previously a left greater saphenous vein ablation by Dr. Kellie Simmering and she had a ablation of the anterior accessory branch of the left greater saphenous vein in March 2016. Seeing that the wound actually closed over. In reviewing the history with her today the ulcer in this area has been recurrent. She describes a biopsy of this area in 2009 that only showed stasis physiology. 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 Do not see that she has ever had arterial studies in the left leg. She tells me she has a follow-up with Dr. Kellie Simmering in roughly 10 days In any case she developed the reopening of this area roughly a month ago. On the background of this she describes rapidly increasing  edema which has responded to Lasix 40 mg and metolazone 2.5 mg as well as the patient's lymph massage. She has been told she has both venous insufficiency and lymphedema but she cannot tolerate compression stockings 11/28/16; the patient saw Dr. Kellie Simmering recently. Per the patient he did arterial Dopplers in the office that did not show evidence of arterial insufficiency, per the patient he stated "treat this like an ordinary venous ulcer". She also saw her dermatologist Dr. Ronnald Ramp who felt that this was more of a vascular ulcer. In general things are improving although she arrives today with increasing bilateral lower extremity edema with weeping a deeper fluid through the wound on the left medial leg compatible with some degree of lymphedema 12/04/16; the patient's wound is fully epithelialized but I don't think fully healed. We will do another week of depression with Promogran and TCA however I suspect we'll be able to discharge her next week. This is a very unusual-looking wound which was initially a figure-of-eight type wound lying on its side surrounded by petechial like hemorrhage. She has had venous ablation on this side. She apparently does not have an arterial issue per Dr. Kellie Simmering. She saw her dermatologist thought it was "vascular". Patient is definitely going to need ongoing compression and I talked about this with her today she will go to elastic therapy after she leaves here next week 12/11/16; the patient's wound is not completely closed today. She has surrounding scar tissue and in further discussion with the patient it would appear that she had ulcers in this area in 2009 for a prolonged period of time ultimately requiring a punch biopsy of this area that only showed venous insufficiency. I did not previously pickup on this part of the history from the patient. 12/18/16; the patient's wound is completely  epithelialized. There is no open area here. She has significant bilateral  venous insufficiency with secondary lymphedema to a mild-to-moderate degree she does not have compression stockings.. She did not say anything to me when I was in the room, she told our intake nurse that she was still having pain in this area. This isn't unusual recurrent small open area. She is going to go to elastic therapy to obtain compression stockings. 12/25/16; the patient's wound is fully epithelialized. There is no open area here. The patient describes some continued episodic discomfort in this area medial left calf. However everything looks fine and healed here. She is been to elastic therapy and [name], BRAMEL ([id number]) caught herself 15-20 mmHg stockings, they apparently were having trouble getting 20-30 mm stockings in her size 01/22/17; this is a patient we discharged from the clinic a month ago. She has a recurrent open wound on her medial left calf. She had 15 mm support stockings. I told her I thought she needed 20-30 mm compression stockings. She tells me that she has been ill with hospitalization secondary to asthma and is been found to have severe hypokalemia likely secondary to a combination of Lasix and metolazone. This morning she noted blistering and leaking fluid on the posterior part of her left leg. She called our intake nurse urgently and we was saw her this afternoon. She has not had any real discomfort here. I don't know that she's been wearing any stockings on this leg for at least 2-3 days. ABIs in this clinic were 1.21 on the right and 1.3 on the left. She is previously seen vascular surgery who does not think that there is a peripheral arterial issue. 01/30/17; Patient arrives with no open wound on the left leg. She has been to elastic therapy and obtained 20-28mmhg below knee stockings and she has one on the right leg today. READMISSION 02/19/18; this Engebretson is a now 72 year old patient we've had in this clinic perhaps 3 times before. I had last looked at her  from January 07 December 2016 with an area on the medial left leg. We discharged her on 12/25/16 however she had to be readmitted on 01/22/17 with a recurrence. I have in my notes that we discharged her on 20-30 mm stockings although she tells me she was only wearing support hose because she cannot get stockings on predominantly related to her cervical spine surgery/issues. She has had previous ablations done by vein and vascular in [place] including a great saphenous vein ablation on the left with an anterior accessory branch ablation I think both of these were in 2016. On one of the previous visit she had a biopsy noted 2009 that was negative. She is not felt to have an arterial issue. She is not a diabetic. She does have a history of obstructive sleep apnea hypertension asthma as well as chronic venous insufficiency and lymphedema. On this occasion she noted 2 dry scaly patch on her left leg. She tried to put lotion on this it didn't really help. There were 2 open areas.the patient has been seeing her primary physician from 02/05/18 through 02/14/18. She had Unna boots applied. The superior wound now on the lateral left leg has closed but she's had one wound that remains open on the lateral left leg. This is not the same spot as we dealt with in 2018. ABIs in this clinic were 1.3 bilaterally 02/26/18; patient has a small wound on the left lateral calf. Dimensions are down. She has chronic  venous insufficiency and lymphedema. 03/05/18; small open area on the left lateral calf. Dimensions are down. Tightly adherent necrotic debris over the surface of the wound which was difficult to remove. Also the dressing [over collagen] stuck to the wound surface. This was removed with some difficulty as well. Change the primary dressing to Hydrofera Blue ready 03/12/18; small open area on the left lateral calf. Comes in with tightly adherent surface eschar as well as some adherent Hydrofera Blue. 03/19/18; open area  on the left lateral calf. Again adherent surface eschar as well as some adherent Hydrofera Blue nonviable subcutaneous tissue. She complained of pain all week even with the reduction from 4-3 layer compression I put on last week. Also she had an increase in her ankle and calf measurements probably related to the same thing. Objective Constitutional Sitting or standing Blood Pressure is within target range for patient.. Pulse regular and within target range for patient.Marland Kitchen Respirations regular, non-labored and within target range.. Temperature is normal and within the target range for the patient.Marland Kitchen appears in no distress. Vitals Time Taken: 12:50 PM, Height: 63 in, Weight: 219.9 lbs, BMI: 38.9, Temperature: 98.7 F, Pulse: 61 bpm, Respiratory Rate: 18 breaths/min, Blood Pressure: 127/62 mmHg. General Notes: when exam; the area in question is on the left lateral leg. Small area looking somewhat irritated with some Seckinger, [name] ([id number]) degree of subdermal hemorrhage in spots around the wound. Again tightly adherent eschar and some subcutaneous tissue debrided. hemostasis with direct pressure.This is a small wound and I'm not really sure why this is forming. There is really no evidence of surrounding infection definitely less edema control. Integumentary (Hair, Skin) Wound #4 status is Open. Original cause of wound was Gradually Appeared. The wound is located on the Left,Lateral Lower Leg. The wound measures 0.4cm length x 0.4cm width x 0.1cm depth; 0.126cm^2 area and 0.013cm^3 volume. There is no tunneling or undermining noted. There is a medium amount of serosanguineous drainage noted. The wound margin is flat and intact. There is no granulation within the wound bed. There is a large (67-100%) amount of necrotic tissue within the wound bed including Eschar and Adherent Slough. The periwound skin appearance exhibited: Dry/Scaly. The periwound skin appearance did not exhibit: Maceration.  Periwound temperature was noted as No Abnormality. The periwound has tenderness on palpation. 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 There was a Excisional Skin/Subcutaneous Tissue Debridement with a total area of 0.16 sq cm performed by [name] Dillon, MD. With the following instrument(s): Curette to remove Non-Viable tissue/material. Material removed includes Eschar and Subcutaneous Tissue and after achieving pain control using Other (lidocaine 4%). No specimens were taken. A time out was conducted at 13:10, prior to the start of the procedure. A Moderate amount of bleeding was controlled with Pressure. The procedure was tolerated well with a pain level of 1 throughout and a pain level of 1 following the procedure. Patient s Level of Consciousness post procedure was recorded as Awake and Alert and post-procedure vitals were taken including Temperature: 98.7 F, Pulse: 61 bpm, Respiratory Rate: 18 breaths/min, Blood Pressure: (127)/(62) mmHg. Post Debridement Measurements: 0.4cm length x 0.4cm width x 0.2cm depth;  0.025cm^3 volume. Character of Wound/Ulcer Post Debridement requires further debridement. Severity of Tissue Post Debridement is: Fat layer exposed. Post procedure Diagnosis Wound #4: Same as Pre-Procedure Plan Wound Cleansing: Wound #4 Left,Lateral Lower Leg: Clean wound with Normal Saline. May Shower, gently pat wound dry prior to applying new dressing. TAYDEM, CAVAGNARO ([id number]) Anesthetic (add to Medication List): Wound #4 Left,Lateral Lower Leg: Topical Lidocaine 4% cream  applied to wound bed prior to debridement (In Clinic Only). Benzocaine Topical Anesthetic Spray applied to wound bed prior to debridement (In Clinic Only). Primary Wound Dressing: Wound #4 Left,Lateral Lower Leg: Silver Alginate Secondary Dressing: Wound #4 Left,Lateral Lower Leg: ABD pad Dressing Change Frequency: Wound #4 Left,Lateral Lower Leg: Change dressing every week Follow-up Appointments: Wound #4 Left,Lateral Lower Leg: Return Appointment in 1 week. Nurse Visit as needed Edema Control: Wound #4 Left,Lateral Lower Leg: 4-Layer Compression System - Left Lower Extremity #1 I change the primary dressing silver alginate/ABDs under a 4 layer compression instead of 3. #2 hydronephrosis and to stick to the wound and I'm wondering whether this had anything to do with the nonhealing and the discomfort the patient is experiencing. Electronic Signature(s) Signed: 03/19/2018 4:50:03 PM By: Linton Ham MD Entered By: Linton Ham on 03/19/2018 13:22:46 Utley, [name] ([id number]) -------------------------------------------------------------------------------- SuperBill Details Patient Name: ELENE, DOWNUM. Date of Service: 03/19/2018 Medical Record Number: [id number] Patient Account Number: [id number] Date of Birth/Sex: [birth year] (72 y.o. F) Treating RN: Cornell Barman Primary Care Provider: [name] Other Clinician: Referring Provider: [name] Treating Provider/Extender: Tito Dine in Treatment: 4 Diagnosis Coding ICD-10 Codes Code Description [phone] Non-pressure chronic ulcer of left calf with other specified severity I89.0 Lymphedema, not elsewhere classified I87.332 Chronic venous hypertension (idiopathic) with ulcer and inflammation of left lower extremity Facility Procedures CPT4 Code: 59935701 Description: 77939 - DEB SUBQ TISSUE 20 SQ CM/< ICD-10 Diagnosis Description L97.228 Non-pressure chronic ulcer of left calf with other specified  I89.0 Lymphedema, not elsewhere classified Modifier: severity Quantity: 1 Physician Procedures CPT4 Code: [id number] Description: 30076 - WC PHYS SUBQ TISS 20 SQ CM ICD-10 Diagnosis Description L97.228 Non-pressure chronic ulcer of left calf with other specified I89.0 Lymphedema, not elsewhere classified Modifier: severity Quantity: 1 Electronic Signature(s) Signed: 03/19/2018 4:50:03 PM By: Linton Ham MD Entered By: Linton Ham on 03/19/2018 13:23:12

## 2018-03-26 ENCOUNTER — Encounter: Payer: Medicare Other | Admitting: Internal Medicine

## 2018-03-26 DIAGNOSIS — I87312 Chronic venous hypertension (idiopathic) with ulcer of left lower extremity: Secondary | ICD-10-CM | POA: Diagnosis not present

## 2018-03-26 DIAGNOSIS — I890 Lymphedema, not elsewhere classified: Secondary | ICD-10-CM | POA: Diagnosis not present

## 2018-03-26 DIAGNOSIS — I10 Essential (primary) hypertension: Secondary | ICD-10-CM | POA: Diagnosis not present

## 2018-03-26 DIAGNOSIS — G4733 Obstructive sleep apnea (adult) (pediatric): Secondary | ICD-10-CM | POA: Diagnosis not present

## 2018-03-26 DIAGNOSIS — I87332 Chronic venous hypertension (idiopathic) with ulcer and inflammation of left lower extremity: Secondary | ICD-10-CM | POA: Diagnosis not present

## 2018-03-26 DIAGNOSIS — L97221 Non-pressure chronic ulcer of left calf limited to breakdown of skin: Secondary | ICD-10-CM | POA: Diagnosis not present

## 2018-03-26 DIAGNOSIS — L97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d: Secondary | ICD-10-CM | POA: Diagnosis not present

## 2018-03-26 DIAGNOSIS — J45909 Unspecified asthma, uncomplicated: Secondary | ICD-10-CM | POA: Diagnosis not present

## 2018-03-28 NOTE — Progress Notes (Signed)
ALEXSANDRIA, KIVETT ([id number]) Visit Report for 03/26/2018 Debridement Details Patient Name: [name], [name]. Date of Service: 03/26/2018 12:30 PM Medical Record Number: [id number] Patient Account Number: [id number] Date of Birth/Sex: [birth year] (71 y.o. F) Treating RN: Cornell Barman Primary Care Provider: [name] Other Clinician: Referring Provider: [name] Treating Provider/Extender: Tito Dine in Treatment: 5 Debridement Performed for Wound #4 Left,Lateral Lower Leg Assessment: Performed By: Physician [name] Dillon, MD Debridement Type: Debridement Severity of Tissue Pre Limited to breakdown of skin Debridement: Pre-procedure Verification/Time Yes - 13:02 Out Taken: Start Time: 13:02 Pain Control: Other : lidocaine 4% Total Area Debrided (L x W): 0.5 (cm) x 0.4 (cm) = 0.2 (cm) Tissue and other material Non-Viable, Eschar debrided: Level: Non-Viable Tissue Debridement Description: Selective/Open Wound Instrument: Curette Bleeding: None End Time: 13:04 Procedural Pain: 2 Post Procedural Pain: 1 Response to Treatment: Procedure was tolerated well Level of Consciousness: Awake and Alert Post Procedure Vitals: Temperature: 98.5 Pulse: 64 Respiratory Rate: 16 Blood Pressure: Systolic Blood Pressure: 884 Diastolic Blood Pressure: 65 Post Debridement Measurements of Total Wound Length: (cm) 0.5 Width: (cm) 0.4 Depth: (cm) 0.1 Volume: (cm) 0.016 Character of Wound/Ulcer Post Debridement: Requires Further Debridement Severity of Tissue Post Debridement: Limited to breakdown of skin Post Procedure Diagnosis Same as Pre-procedure Electronic Signature(s) HUDSON, LEHMKUHL ([id number]) Signed: 03/26/2018 5:10:30 PM By: Linton Ham MD Signed: 03/26/2018 5:19:09 PM By: [name], [name], RN, CWS, [name], [name] Entered By: Linton Ham on 03/26/2018 13:17:57 Caisse, [name]  ([id number]) -------------------------------------------------------------------------------- HPI Details Patient Name: [name], [name]. Date of Service: 03/26/2018 12:30 PM Medical Record Number: [id number] Patient Account Number: [id number] Date of Birth/Sex: 1946/04/24 (71 y.o. F) Treating RN: Cornell Barman Primary Care Provider: [name] Other Clinician: Referring Provider: [name] Treating Provider/Extender: Tito Dine in Treatment: 5 History of Present Illness HPI Description: Pleasant 72 year old with history of chronic venous insufficiency. No diabetes or peripheral vascular disease. Left ABI 1.29. Questionable history of left lower extremity DVT. She developed a recurrent ulceration on her left lateral calf in December 2015, which she attributes to poor diet and subsequent lower extremity edema. She underwent endovenous laser ablation of her left greater saphenous vein in 2010. She underwent laser ablation of accessory branch of left GSV in April 2016 by Dr. Kellie Simmering at [facility]. She was previously wearing Unna boots, which she tolerated well. Tolerating 2 layer compression and cadexomer iodine. She returns to clinic for follow-up and is without new complaints. She denies any significant pain at this time. She reports persistent pain with pressure. No claudication or ischemic rest pain. No fever or chills. No drainage. READMISSION 11/13/16; this is a 72 year old woman who is not a diabetic. She is here for a review of a painful area on her left medial lower extremity. I note that she was seen here previously last year for wound I believe to be in the same area. At that time she had undergone previously a left greater saphenous vein ablation by Dr. Kellie Simmering and she had a ablation of the anterior accessory branch of the left greater saphenous vein in March 2016. Seeing that the wound actually closed over. In reviewing the history with her today the ulcer in this  area has been recurrent. She describes a biopsy of this area in 2009 that only showed stasis physiology. 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 Lutricia Feil of oncology and in August of this  year she had surgery for cervical spinal stenosis which left her with [name] improving Horner's syndrome on the left eye. Do not see that she has ever had arterial studies in the left leg. She tells me she has a follow-up with Dr. Kellie Simmering in roughly 10 days In any case she developed the reopening of this area roughly a month ago. On the background of this she describes rapidly increasing edema which has responded to Lasix 40 mg and metolazone 2.5 mg as well as the patient's lymph massage. She has been told she has both venous insufficiency and lymphedema but she cannot tolerate compression stockings 11/28/16; the patient saw Dr. Kellie Simmering recently. Per the patient he did arterial Dopplers in the office that did not show evidence of arterial insufficiency, per the patient he stated "treat this like [name] ordinary venous ulcer". She also saw her dermatologist Dr. Ronnald Ramp who felt that this was more of a vascular ulcer. In general things are improving although she arrives today with increasing bilateral lower extremity edema with weeping a deeper fluid through the wound on the left medial leg compatible with some degree of lymphedema 12/04/16; the patient's wound is fully epithelialized but I don't think fully healed. We will do another week of depression with Promogran and TCA however I suspect we'll be able to discharge her next week. This is a very unusual-looking wound which was initially a figure-of-eight type wound lying on its side surrounded by petechial like hemorrhage. She has had venous ablation on this side. She apparently does not have [name] arterial issue per Dr. Kellie Simmering. She saw her dermatologist thought it was "vascular". Patient is definitely going to need  ongoing compression and I talked about this with her today she will go to elastic therapy after she leaves here next week 12/11/16; the patient's wound is not completely closed today. She has surrounding scar tissue and in further discussion with the patient it would appear that she had ulcers in this area in 2009 for a prolonged period of time ultimately requiring a punch biopsy of this area that only showed venous insufficiency. I did not previously pickup on this part of the history from the patient. 12/18/16; the patient's wound is completely epithelialized. There is no open area here. She has significant bilateral venous insufficiency with secondary lymphedema to a mild-to-moderate degree she does not have compression stockings.. She did not say anything to me when I was in the room, she told our intake nurse that she was still having pain in this area. This isn't unusual recurrent small open area. She is going to go to elastic therapy to obtain compression stockings. 12/25/16; the patient's wound is fully epithelialized. There is no open area here. The patient describes some continued episodic discomfort in this area medial left calf. However everything looks fine and healed here. She is been to elastic therapy and caught herself 15-20 mmHg stockings, they apparently were having trouble getting 20-30 mm stockings in her size [name], SKORA ([id number]) 01/22/17; this is a patient we discharged from the clinic a month ago. She has a recurrent open wound on her medial left calf. She had 15 mm support stockings. I told her I thought she needed 20-30 mm compression stockings. She tells me that she has been ill with hospitalization secondary to asthma and is been found to have severe hypokalemia likely secondary to a combination of Lasix and metolazone. This morning she noted blistering and leaking fluid on the posterior part of her left leg.  She called our intake nurse urgently and we was saw her  this afternoon. She has not had any real discomfort here. I don't know that she's been wearing any stockings on this leg for at least 2-3 days. ABIs in this clinic were 1.21 on the right and 1.3 on the left. She is previously seen vascular surgery who does not think that there is a peripheral arterial issue. 01/30/17; Patient arrives with no open wound on the left leg. She has been to elastic therapy and obtained 20-71mmhg below knee stockings and she has one on the right leg today. READMISSION 02/19/18; this Kring is a now 72 year old patient we've had in this clinic perhaps 3 times before. I had last looked at her from January 07 December 2016 with [name] area on the medial left leg. We discharged her on 12/25/16 however she had to be readmitted on 01/22/17 with a recurrence. I have in my notes that we discharged her on 20-30 mm stockings although she tells me she was only wearing support hose because she cannot get stockings on predominantly related to her cervical spine surgery/issues. She has had previous ablations done by vein and vascular in [place] including a great saphenous vein ablation on the left with [name] anterior accessory branch ablation I think both of these were in 2016. On one of the previous visit she had a biopsy noted 2009 that was negative. She is not felt to have [name] arterial issue. She is not a diabetic. She does have a history of obstructive sleep apnea hypertension asthma as well as chronic venous insufficiency and lymphedema. On this occasion she noted 2 dry scaly patch on her left leg. She tried to put lotion on this it didn't really help. There were 2 open areas.the patient has been seeing her primary physician from 02/05/18 through 02/14/18. She had Unna boots applied. The superior wound now on the lateral left leg has closed but she's had one wound that remains open on the lateral left leg. This is not the same spot as we dealt with in 2018. ABIs in this clinic were 1.3  bilaterally 02/26/18; patient has a small wound on the left lateral calf. Dimensions are down. She has chronic venous insufficiency and lymphedema. 03/05/18; small open area on the left lateral calf. Dimensions are down. Tightly adherent necrotic debris over the surface of the wound which was difficult to remove. Also the dressing [over collagen] stuck to the wound surface. This was removed with some difficulty as well. Change the primary dressing to Hydrofera Blue ready 03/12/18; small open area on the left lateral calf. Comes in with tightly adherent surface eschar as well as some adherent Hydrofera Blue. 03/19/18; open area on the left lateral calf. Again adherent surface eschar as well as some adherent Hydrofera Blue nonviable subcutaneous tissue. She complained of pain all week even with the reduction from 4-3 layer compression I put on last week. Also she had [name] increase in her ankle and calf measurements probably related to the same thing. 03/26/18; open area on the left lateral calf. A very small open area remains here. We used silver alginate starting last week as the Hydrofera Blue seem to stick to the wound bed. In using 4-layer compression Electronic Signature(s) Signed: 03/26/2018 5:10:30 PM By: Linton Ham MD Entered By: Linton Ham on 03/26/2018 13:19:09 [name], [name] ([id number]) -------------------------------------------------------------------------------- Physical Exam Details Patient Name: [name], [name]. Date of Service: 03/26/2018 12:30 PM Medical Record Number: [id number] Patient Account Number: [id number] Date  of Birth/Sex: 12/09/1945 (72 y.o. F) Treating RN: Cornell Barman Primary Care Provider: [name] Other Clinician: Referring Provider: [name] Treating Provider/Extender: Tito Dine in Treatment: 5 Notes wound exam; the area in question is on the left lateral leg. Initially felt to be healed however on close expectant and very  tiny open area remains. Surface eschar removed with a #3 curet. There is no evidence of surrounding erythema. Her edema control is adequate Electronic Signature(s) Signed: 03/26/2018 5:10:30 PM By: Linton Ham MD Entered By: Linton Ham on 03/26/2018 13:19:51 [name], [name] ([id number]) -------------------------------------------------------------------------------- Physician Orders Details Patient Name: [name], [name]. Date of Service: 03/26/2018 12:30 PM Medical Record Number: [id number] Patient Account Number: [id number] Date of Birth/Sex: [birth year] (72 y.o. F) Treating RN: Cornell Barman Primary Care Provider: [name] Other Clinician: Referring Provider: [name] Treating Provider/Extender: Tito Dine in Treatment: 5 Verbal / Phone Orders: No Diagnosis Coding Wound Cleansing Wound #4 Left,Lateral Lower Leg o Clean wound with Normal Saline. o May Shower, gently pat wound dry prior to applying new dressing. Anesthetic (add to Medication List) Wound #4 Left,Lateral Lower Leg o Topical Lidocaine 4% cream applied to wound bed prior to debridement (In Clinic Only). o Benzocaine Topical Anesthetic Spray applied to wound bed prior to debridement (In Clinic Only). Skin Barriers/Peri-Wound Care Wound #4 Left,Lateral Lower Leg o Moisturizing lotion Primary Wound Dressing Wound #4 Left,Lateral Lower Leg o Silver Alginate Secondary Dressing Wound #4 Left,Lateral Lower Leg o ABD pad Dressing Change Frequency Wound #4 Left,Lateral Lower Leg o Change dressing every week Follow-up Appointments Wound #4 Left,Lateral Lower Leg o Return Appointment in 1 week. o Nurse Visit as needed Edema Control Wound #4 Left,Lateral Lower Leg o 4-Layer Compression System - Left Lower Extremity Electronic Signature(s) Signed: 03/26/2018 5:10:30 PM By: Linton Ham MD Signed: 03/26/2018 5:19:09 PM By: [name], [name], RN, CWS, [name], [name] Entered  By: [name], [name], RN, CWS, Kim on 03/26/2018 13:05:14 [name], [name] ([id number], [name] ([id number]) -------------------------------------------------------------------------------- Problem List Details Patient Name: [name], [name]. Date of Service: 03/26/2018 12:30 PM Medical Record Number: [id number] Patient Account Number: [id number] Date of Birth/Sex: [birth year] (72 y.o. F) Treating RN: Cornell Barman Primary Care Provider: [name] Other Clinician: Referring Provider: [name] Treating Provider/Extender: Tito Dine in Treatment: 5 Active Problems ICD-10 Impacting Encounter Code Description Active Date Wound Healing Diagnosis L97.228 Non-pressure chronic ulcer of left calf with other specified 02/19/2018 Yes severity I89.0 Lymphedema, not elsewhere classified 02/19/2018 Yes I87.332 Chronic venous hypertension (idiopathic) with ulcer and 02/19/2018 Yes inflammation of left lower extremity Inactive Problems Resolved Problems Electronic Signature(s) Signed: 03/26/2018 5:10:30 PM By: Linton Ham MD Entered By: Linton Ham on 03/26/2018 13:17:24 [name], [name] ([id number]) -------------------------------------------------------------------------------- Progress Note Details Patient Name: [name], [name]. Date of Service: 03/26/2018 12:30 PM Medical Record Number: [id number] Patient Account Number: [id number] Date of Birth/Sex: 09/27/1946 (72 y.o. F) Treating RN: Cornell Barman Primary Care Provider: [name] Other Clinician: Referring Provider: [name] Treating Provider/Extender: Tito Dine in Treatment: 5 Subjective History of Present Illness (HPI) Pleasant 72 year old with history of chronic venous insufficiency. No diabetes or peripheral vascular disease. Left ABI 1.29. Questionable history of left lower extremity DVT. She developed a recurrent ulceration on her left lateral calf in December 2015, which she  attributes to poor diet and subsequent lower extremity edema. She underwent endovenous laser ablation of her left greater saphenous vein in 2010. She underwent laser ablation of accessory branch of  left GSV in April 2016 by Dr. Kellie Simmering at [facility]. She was previously wearing Unna boots, which she tolerated well. Tolerating 2 layer compression and cadexomer iodine. She returns to clinic for follow-up and is without new complaints. She denies any significant pain at this time. She reports persistent pain with pressure. No claudication or ischemic rest pain. No fever or chills. No drainage. READMISSION 11/13/16; this is a 72 year old woman who is not a diabetic. She is here for a review of a painful area on her left medial lower extremity. I note that she was seen here previously last year for wound I believe to be in the same area. At that time she had undergone previously a left greater saphenous vein ablation by Dr. Kellie Simmering and she had a ablation of the anterior accessory branch of the left greater saphenous vein in March 2016. Seeing that the wound actually closed over. In reviewing the history with her today the ulcer in this area has been recurrent. She describes a biopsy of this area in 2009 that only showed stasis physiology. 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 Lutricia Feil of oncology and in August of this year she had surgery for cervical spinal stenosis which left her with [name] improving Horner's syndrome on the left eye. Do not see that she has ever had arterial studies in the left leg. She tells me she has a follow-up with Dr. Kellie Simmering in roughly 10 days In any case she developed the reopening of this area roughly a month ago. On the background of this she describes rapidly increasing edema which has responded to Lasix 40 mg and metolazone 2.5 mg as well as the patient's lymph massage. She has been told she has both venous  insufficiency and lymphedema but she cannot tolerate compression stockings 11/28/16; the patient saw Dr. Kellie Simmering recently. Per the patient he did arterial Dopplers in the office that did not show evidence of arterial insufficiency, per the patient he stated "treat this like [name] ordinary venous ulcer". She also saw her dermatologist Dr. Ronnald Ramp who felt that this was more of a vascular ulcer. In general things are improving although she arrives today with increasing bilateral lower extremity edema with weeping a deeper fluid through the wound on the left medial leg compatible with some degree of lymphedema 12/04/16; the patient's wound is fully epithelialized but I don't think fully healed. We will do another week of depression with Promogran and TCA however I suspect we'll be able to discharge her next week. This is a very unusual-looking wound which was initially a figure-of-eight type wound lying on its side surrounded by petechial like hemorrhage. She has had venous ablation on this side. She apparently does not have [name] arterial issue per Dr. Kellie Simmering. She saw her dermatologist thought it was "vascular". Patient is definitely going to need ongoing compression and I talked about this with her today she will go to elastic therapy after she leaves here next week 12/11/16; the patient's wound is not completely closed today. She has surrounding scar tissue and in further discussion with the patient it would appear that she had ulcers in this area in 2009 for a prolonged period of time ultimately requiring a punch biopsy of this area that only showed venous insufficiency. I did not previously pickup on this part of the history from the patient. 12/18/16; the patient's wound is completely epithelialized. There is no open area here. She has  significant bilateral venous insufficiency with secondary lymphedema to a mild-to-moderate degree she does not have compression stockings.. She did not say anything to me when  I was in the room, she told our intake nurse that she was still having pain in this area. This isn't unusual recurrent small open area. She is going to go to elastic therapy to obtain compression stockings. 12/25/16; the patient's wound is fully epithelialized. There is no open area here. The patient describes some continued episodic discomfort in this area medial left calf. However everything looks fine and healed here. She is been to elastic therapy and PIERA, [name] ([id number]) caught herself 15-20 mmHg stockings, they apparently were having trouble getting 20-30 mm stockings in her size 01/22/17; this is a patient we discharged from the clinic a month ago. She has a recurrent open wound on her medial left calf. She had 15 mm support stockings. I told her I thought she needed 20-30 mm compression stockings. She tells me that she has been ill with hospitalization secondary to asthma and is been found to have severe hypokalemia likely secondary to a combination of Lasix and metolazone. This morning she noted blistering and leaking fluid on the posterior part of her left leg. She called our intake nurse urgently and we was saw her this afternoon. She has not had any real discomfort here. I don't know that she's been wearing any stockings on this leg for at least 2-3 days. ABIs in this clinic were 1.21 on the right and 1.3 on the left. She is previously seen vascular surgery who does not think that there is a peripheral arterial issue. 01/30/17; Patient arrives with no open wound on the left leg. She has been to elastic therapy and obtained 20-46mmhg below knee stockings and she has one on the right leg today. READMISSION 02/19/18; this Yepiz is a now 72 year old patient we've had in this clinic perhaps 3 times before. I had last looked at her from January 07 December 2016 with [name] area on the medial left leg. We discharged her on 12/25/16 however she had to be readmitted on 01/22/17 with a recurrence.  I have in my notes that we discharged her on 20-30 mm stockings although she tells me she was only wearing support hose because she cannot get stockings on predominantly related to her cervical spine surgery/issues. She has had previous ablations done by vein and vascular in [place] including a great saphenous vein ablation on the left with [name] anterior accessory branch ablation I think both of these were in 2016. On one of the previous visit she had a biopsy noted 2009 that was negative. She is not felt to have [name] arterial issue. She is not a diabetic. She does have a history of obstructive sleep apnea hypertension asthma as well as chronic venous insufficiency and lymphedema. On this occasion she noted 2 dry scaly patch on her left leg. She tried to put lotion on this it didn't really help. There were 2 open areas.the patient has been seeing her primary physician from 02/05/18 through 02/14/18. She had Unna boots applied. The superior wound now on the lateral left leg has closed but she's had one wound that remains open on the lateral left leg. This is not the same spot as we dealt with in 2018. ABIs in this clinic were 1.3 bilaterally 02/26/18; patient has a small wound on the left lateral calf. Dimensions are down. She has chronic venous insufficiency and lymphedema. 03/05/18; small open area on  the left lateral calf. Dimensions are down. Tightly adherent necrotic debris over the surface of the wound which was difficult to remove. Also the dressing [over collagen] stuck to the wound surface. This was removed with some difficulty as well. Change the primary dressing to Hydrofera Blue ready 03/12/18; small open area on the left lateral calf. Comes in with tightly adherent surface eschar as well as some adherent Hydrofera Blue. 03/19/18; open area on the left lateral calf. Again adherent surface eschar as well as some adherent Hydrofera Blue nonviable subcutaneous tissue. She complained of pain all week  even with the reduction from 4-3 layer compression I put on last week. Also she had [name] increase in her ankle and calf measurements probably related to the same thing. 03/26/18; open area on the left lateral calf. A very small open area remains here. We used silver alginate starting last week as the Hydrofera Blue seem to stick to the wound bed. In using 4-layer compression Objective Constitutional Vitals Time Taken: 12:41 PM, Height: 63 in, Weight: 219.9 lbs, BMI: 38.9, Temperature: 98.5 F, Pulse: 64 bpm, Respiratory Rate: 16 breaths/min, Blood Pressure: 120/65 mmHg. Integumentary (Hair, Skin) Wound #4 status is Open. Original cause of wound was Gradually Appeared. The wound is located on the Left,Lateral Lower Leg. The wound measures 0.5cm length x 0.4cm width x 0.1cm depth; 0.157cm^2 area and 0.016cm^3 volume. There is no tunneling or undermining noted. There is a none present amount of drainage noted. The wound margin is flat and intact. LEZLEY, BEDGOOD ([id number]) There is no granulation within the wound bed. There is no necrotic tissue within the wound bed. The periwound skin appearance exhibited: Dry/Scaly. The periwound skin appearance did not exhibit: Maceration. Periwound temperature was noted as No Abnormality. The periwound has tenderness on palpation. 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 There was a Selective/Open Wound Non-Viable Tissue Debridement with a total area of 0.2 sq cm performed by [name] Dillon, MD. With the following instrument(s): Curette to remove  Non- Viable tissue/material. Material removed includes Eschar after achieving pain control using Other (lidocaine 4%). No specimens were taken. A time out was conducted at 13:02, prior to the start of the procedure. There was no bleeding. The procedure was tolerated well with a pain level of 2 throughout and a pain level of 1 following the procedure. Patient s Level of Consciousness post procedure was recorded as Awake and Alert and post-procedure vitals were taken including Temperature: 98.5 F, Pulse: 64 bpm, Respiratory Rate: 16 breaths/min, Blood Pressure: (120)/(65) mmHg. Post Debridement Measurements: 0.5cm length x 0.4cm width x 0.1cm depth; 0.016cm^3 volume. Character of Wound/Ulcer Post Debridement requires further debridement. Severity of Tissue Post Debridement is: Limited to breakdown of skin. Post procedure Diagnosis Wound #4: Same as Pre-Procedure Plan Wound Cleansing: Wound #4 Left,Lateral Lower Leg: Clean wound with Normal Saline. May Shower, gently pat wound dry prior to applying new dressing. Anesthetic (add to Medication List): Wound #4 Left,Lateral Lower Leg: Topical Lidocaine 4% cream applied to wound bed prior to debridement (In Clinic Only). Benzocaine Topical Anesthetic Spray applied to wound bed prior to debridement (In Clinic Only). Skin Barriers/Peri-Wound Care: Wound #  4 Left,Lateral Lower Leg: Moisturizing lotion Primary Wound Dressing: Wound #4 Left,Lateral Lower Leg: Silver Alginate LENIX, BENOIST ([id number]) Secondary Dressing: Wound #4 Left,Lateral Lower Leg: ABD pad Dressing Change Frequency: Wound #4 Left,Lateral Lower Leg: Change dressing every week Follow-up Appointments: Wound #4 Left,Lateral Lower Leg: Return Appointment in 1 week. Nurse Visit as needed Edema Control: Wound #4 Left,Lateral Lower Leg: 4-Layer Compression System - Left Lower Extremity #1 I cleaned up the surface eschar over the small reminiscence of this wound #2Senior  Silver alginate under for liver compression. I'm hopeful this will be closed by next week #3 she has really support stockings and waiting here. We'll have to see if this will maintain her skin integrity. Electronic Signature(s) Signed: 03/26/2018 5:10:30 PM By: Linton Ham MD Entered By: Linton Ham on 03/26/2018 13:23:58 ANNTONETTE, MADEWELL ([id number]) -------------------------------------------------------------------------------- SuperBill Details Patient Name: MITA, VALLO. Date of Service: 03/26/2018 Medical Record Number: [id number] Patient Account Number: [id number] Date of Birth/Sex: January 31, 1946 (72 y.o. F) Treating RN: Cornell Barman Primary Care Provider: [name] Other Clinician: Referring Provider: [name] Treating Provider/Extender: Tito Dine in Treatment: 5 Diagnosis Coding ICD-10 Codes Code Description [phone] Non-pressure chronic ulcer of left calf with other specified severity I89.0 Lymphedema, not elsewhere classified I87.332 Chronic venous hypertension (idiopathic) with ulcer and inflammation of left lower extremity Facility Procedures CPT4 Code: [id number] Description: [phone] - DEBRIDE WOUND 1ST 20 SQ CM OR < ICD-10 Diagnosis Description L97.228 Non-pressure chronic ulcer of left calf with other specified s Modifier: everity Quantity: 1 Physician Procedures CPT4 Code: [id number] Description: 83419 - WC PHYS DEBR WO ANESTH 20 SQ CM ICD-10 Diagnosis Description L97.228 Non-pressure chronic ulcer of left calf with other specified se Modifier: verity Quantity: 1 Electronic Signature(s) Signed: 03/26/2018 5:10:30 PM By: Linton Ham MD Entered By: Linton Ham on 03/26/2018 13:24:21

## 2018-03-28 NOTE — Progress Notes (Signed)
[name], [name] ([id number]) Visit Report for 03/26/2018 Arrival Information Details Patient Name: [name], [name]. Date of Service: 03/26/2018 12:30 PM Medical Record Number: [id number] Patient Account Number: [id number] Date of Birth/Sex: 1945-11-26 (72 y.o. F) Treating RN: Montey Hora Primary Care Adaiah Jaskot: [name] Other Clinician: Referring Kirstina Leinweber: [name] Treating Aziah Brostrom/Extender: Tito Dine in Treatment: 5 Visit Information History Since Last Visit All ordered tests and consults were completed: No Patient Arrived: Ambulatory Added or deleted any medications: No Arrival Time: 12:41 Any new allergies or adverse reactions: No Accompanied By: self Had a fall or experienced change in No Transfer Assistance: Manual activities of daily living that may affect Patient Identification Verified: Yes risk of falls: Secondary Verification Process Completed: Yes Signs or symptoms of abuse/neglect since last visito No Patient Requires Transmission-Based No Hospitalized since last visit: No Precautions: Implantable device outside of the clinic excluding No Patient Has Alerts: No cellular tissue based products placed in the center since last visit: Pain Present Now: No Electronic Signature(s) Signed: 03/26/2018 4:55:05 PM By: Montey Hora Entered By: Montey Hora on 03/26/2018 12:41:47 Mauger, [name] ([id number]) -------------------------------------------------------------------------------- Encounter Discharge Information Details Patient Name: [name], [name]. Date of Service: 03/26/2018 12:30 PM Medical Record Number: [id number] Patient Account Number: [id number] Date of Birth/Sex: 29-Dec-1945 (72 y.o. F) Treating RN: Montey Hora Primary Care Ayat Drenning: [name] Other Clinician: Referring Kadence Mimbs: [name] Treating Jahmani Staup/Extender: Tito Dine in Treatment: 5 Encounter Discharge Information Items Discharge  Condition: Stable Ambulatory Status: Ambulatory Discharge Destination: Home Transportation: Private Auto Accompanied By: self Schedule Follow-up Appointment: Yes Clinical Summary of Care: Electronic Signature(s) Signed: 03/26/2018 1:42:47 PM By: Montey Hora Entered By: Montey Hora on 03/26/2018 13:42:47 Banwart, [name] ([id number]) -------------------------------------------------------------------------------- Lower Extremity Assessment Details Patient Name: [name], [name]. Date of Service: 03/26/2018 12:30 PM Medical Record Number: [id number] Patient Account Number: [id number] Date of Birth/Sex: [birth year] (72 y.o. F) Treating RN: Montey Hora Primary Care Jeneal Vogl: [name] Other Clinician: Referring Darroll Bredeson: [name] Treating Lomax Poehler/Extender: Tito Dine in Treatment: 5 Edema Assessment Assessed: [Left: No] [Right: No] [Left: Edema] [Right: :] Calf Left: Right: Point of Measurement: 32 cm From Medial Instep 41.1 cm cm Ankle Left: Right: Point of Measurement: 12 cm From Medial Instep 23 cm cm Vascular Assessment Pulses: Dorsalis Pedis Palpable: [Left:Yes] Posterior Tibial Extremity colors, hair growth, and conditions: Extremity Color: [Left:Hyperpigmented] Hair Growth on Extremity: [Left:Yes] Temperature of Extremity: [Left:Warm] Capillary Refill: [Left:< 3 seconds] Toe Nail Assessment Left: Right: Thick: Yes Discolored: Yes Deformed: No Improper Length and Hygiene: Yes Electronic Signature(s) Signed: 03/26/2018 4:55:05 PM By: Montey Hora Entered By: Montey Hora on 03/26/2018 12:47:36 Pelly, [name] ([id number]) -------------------------------------------------------------------------------- Multi Wound Chart Details Patient Name: Amber, [name]. Date of Service: 03/26/2018 12:30 PM Medical Record Number: [id number] Patient Account Number: [id number] Date of Birth/Sex: 12-Jun-1946 (72 y.o. F) Treating RN: Cornell Barman Primary Care Shevaun Lovan: [name] Other Clinician: Referring Tyrina Hines: [name] Treating Alieu Finnigan/Extender: Tito Dine in Treatment: 5 Vital Signs Height(in): 63 Pulse(bpm): 54 Weight(lbs): 219.9 Blood Pressure(mmHg): 120/65 Body Mass Index(BMI): 39 Temperature(F): 98.5 Respiratory Rate 16 (breaths/min): Photos: [4:No Photos] [N/A:N/A] Wound Location: [4:Left, Lateral Lower Leg] [N/A:N/A] Wounding Event: [4:Gradually Appeared] [N/A:N/A] Primary Etiology: [4:Venous Leg Ulcer] [N/A:N/A] Comorbid History: [4:Cataracts, Asthma, Sleep Apnea, Deep Vein Thrombosis, Hypertension, Peripheral Venous Disease, Osteoarthritis, Received Chemotherapy, Received Radiation] [N/A:N/A] Date Acquired: [4:02/05/2018] [N/A:N/A] Weeks of Treatment: [4:5] [N/A:N/A] Wound Status: [4:Open] [N/A:N/A] Measurements L x W x D [4:0.5x0.4x0.1] [N/A:N/A] (cm) Area (cm) : [  4:0.157] [N/A:N/A] Volume (cm) : [4:0.016] [N/A:N/A] % Reduction in Area: [4:72.20%] [N/A:N/A] % Reduction in Volume: [4:71.90%] [N/A:N/A] Classification: [4:Full Thickness Without Exposed Support Structures] [N/A:N/A] Exudate Amount: [4:None Present] [N/A:N/A] Wound Margin: [4:Flat and Intact] [N/A:N/A] Granulation Amount: [4:None Present (0%)] [N/A:N/A] Necrotic Amount: [4:None Present (0%)] [N/A:N/A] Exposed Structures: [4:Fascia: No Fat Layer (Subcutaneous Tissue) Exposed: No Tendon: No Muscle: No Joint: No Bone: No] [N/A:N/A] Epithelialization: [4:Small (1-33%)] [N/A:N/A] Debridement: [4:Debridement - Selective/Open Wound 13:02] [N/A:N/A N/A] Pre-procedure Verification/Time Out Taken: Pain Control: Other N/A N/A Tissue Debrided: Necrotic/Eschar N/A N/A Level: Non-Viable Tissue N/A N/A Debridement Area (sq cm): 0.2 N/A N/A Instrument: Curette N/A N/A Bleeding: None N/A N/A Procedural Pain: 2 N/A N/A Post Procedural Pain: 1 N/A N/A Debridement Treatment Procedure was tolerated well N/A  N/A Response: Post Debridement 0.5x0.4x0.1 N/A N/A Measurements L x W x D (cm) Post Debridement Volume: 0.016 N/A N/A (cm) Periwound Skin Texture: No Abnormalities Noted N/A N/A Periwound Skin Moisture: Dry/Scaly: Yes N/A N/A Maceration: No Periwound Skin Color: No Abnormalities Noted N/A N/A Temperature: No Abnormality N/A N/A Tenderness on Palpation: Yes N/A N/A Wound Preparation: Ulcer Cleansing: N/A N/A Rinsed/Irrigated with Saline Topical Anesthetic Applied: Other: lidocaine 4% Procedures Performed: Debridement N/A N/A Treatment Notes Electronic Signature(s) Signed: 03/26/2018 5:10:30 PM By: Linton Ham MD Entered By: Linton Ham on 03/26/2018 13:17:38 Girvan, [name] ([id number]) -------------------------------------------------------------------------------- Multi-Disciplinary Care Plan Details Patient Name: RONNEISHA, JETT. Date of Service: 03/26/2018 12:30 PM Medical Record Number: [id number] Patient Account Number: [id number] Date of Birth/Sex: 10/04/1946 (72 y.o. F) Treating RN: Cornell Barman Primary Care Savian Mazon: [name] Other Clinician: Referring Navi Erber: [name] Treating Gyselle Matthew/Extender: Tito Dine in Treatment: 5 Active Inactive ` Orientation to the Wound Care Program Nursing Diagnoses: Knowledge deficit related to the wound healing center program Goals: Patient/caregiver will verbalize understanding of the [facility] Program Date Initiated: 02/19/2018 Target Resolution Date: 03/21/2018 Goal Status: Active Interventions: Provide education on orientation to the wound center Notes: ` Venous Leg Ulcer Nursing Diagnoses: Actual venous Insuffiency (use after diagnosis is confirmed) Goals: Patient will maintain optimal edema control Date Initiated: 02/19/2018 Target Resolution Date: 03/21/2018 Goal Status: Active Interventions: Compression as ordered Treatment Activities: Therapeutic compression applied  : 02/19/2018 Notes: ` Wound/Skin Impairment Nursing Diagnoses: Impaired tissue integrity Goals: Ulcer/skin breakdown will have a volume reduction of 80% by week 12 Date Initiated: 02/19/2018 Target Resolution Date: 06/21/2018 RANESHA, VAL ([id number]) Goal Status: Active Interventions: Assess ulceration(s) every visit Treatment Activities: Topical wound management initiated : 02/19/2018 Notes: Electronic Signature(s) Signed: 03/26/2018 5:19:09 PM By: [name], [name], RN, CWS, [name], [name] Entered By: [name], [name], RN, CWS, Kim on 03/26/2018 13:02:25 ISABELLAH, SOBOCINSKI ([id number]) -------------------------------------------------------------------------------- Pain Assessment Details Patient Name: [name], [name]. Date of Service: 03/26/2018 12:30 PM Medical Record Number: [id number] Patient Account Number: [id number] Date of Birth/Sex: [birth year] (72 y.o. F) Treating RN: Montey Hora Primary Care Anastaisa Wooding: [name] Other Clinician: Referring Brandy Kabat: [name] Treating Melysa Schroyer/Extender: Tito Dine in Treatment: 5 Active Problems Location of Pain Severity and Description of Pain Patient Has Paino No Site Locations Pain Management and Medication Current Pain Management: Electronic Signature(s) Signed: 03/26/2018 4:55:05 PM By: Montey Hora Entered By: Montey Hora on 03/26/2018 12:41:55 Davidson, [name] ([id number]) -------------------------------------------------------------------------------- Patient/Caregiver Education Details Patient Name: [name], [name]. Date of Service: 03/26/2018 12:30 PM Medical Record Number: [id number] Patient Account Number: [id number] Date of Birth/Gender: 1946-02-06 (72 y.o. F) Treating RN: Montey Hora Primary Care Physician: [name] Other Clinician: Referring  Physician: [name] Treating Physician/Extender: Tito Dine in Treatment: 5 Education Assessment Education Provided  To: Patient Education Topics Provided Venous: Handouts: Other: leg elevation Methods: Explain/Verbal Responses: State content correctly Electronic Signature(s) Signed: 03/26/2018 4:55:05 PM By: Montey Hora Entered By: Montey Hora on 03/26/2018 13:43:04 Duma, [name] ([id number]) -------------------------------------------------------------------------------- Wound Assessment Details Patient Name: [name], [name]. Date of Service: 03/26/2018 12:30 PM Medical Record Number: [id number] Patient Account Number: [id number] Date of Birth/Sex: 01-30-1946 (72 y.o. F) Treating RN: Cornell Barman Primary Care Kinsley Nicklaus: [name] Other Clinician: Referring Dejanique Ruehl: [name] Treating Brennen Camper/Extender: Tito Dine in Treatment: 5 Wound Status Wound Number: 4 Primary Venous Leg Ulcer Etiology: Wound Location: Left, Lateral Lower Leg Wound Open Wounding Event: Gradually Appeared Status: Date Acquired: 02/05/2018 Comorbid Cataracts, Asthma, Sleep Apnea, Deep Vein Weeks Of Treatment: 5 History: Thrombosis, Hypertension, Peripheral Venous Clustered Wound: No Disease, Osteoarthritis, Received Chemotherapy, Received Radiation Wound Measurements Length: (cm) 0.5 Width: (cm) 0.4 Depth: (cm) 0.1 Area: (cm) 0.157 Volume: (cm) 0.016 % Reduction in Area: 72.2% % Reduction in Volume: 71.9% Epithelialization: Small (1-33%) Tunneling: No Undermining: No Wound Description Full Thickness Without Exposed Support Classification: Structures Wound Margin: Flat and Intact Exudate None Present Amount: Foul Odor After Cleansing: No Slough/Fibrino Yes Wound Bed Granulation Amount: None Present (0%) Exposed Structure Necrotic Amount: None Present (0%) Fascia Exposed: No Fat Layer (Subcutaneous Tissue) Exposed: No Tendon Exposed: No Muscle Exposed: No Joint Exposed: No Bone Exposed: No Periwound Skin Texture Texture Color No Abnormalities Noted: No No  Abnormalities Noted: No Moisture Temperature / Pain No Abnormalities Noted: No Temperature: No Abnormality Dry / Scaly: Yes Tenderness on Palpation: Yes Maceration: No Wound Preparation Ulcer Cleansing: Rinsed/Irrigated with Saline Topical Anesthetic Applied: Other: lidocaine 4%, KHADIJAH, MASTRIANNI. ([id number]) Treatment Notes Wound #4 (Left, Lateral Lower Leg) 1. Cleansed with: Cleanse wound with antibacterial soap and water 2. Anesthetic Hurricaine Topical Anesthetic Spray 3. Peri-wound Care: Moisturizing lotion 4. Dressing Applied: Other dressing (specify in notes) 7. Secured with 3 Layer Compression System - Left Lower Extremity Notes unna to anchor, silvercel Electronic Signature(s) Signed: 03/26/2018 5:19:09 PM By: [name], [name], RN, CWS, [name], [name] Entered By: [name], [name], RN, CWS, Kim on 03/26/2018 13:02:19 JERENE, YEAGER ([id number]) -------------------------------------------------------------------------------- Vitals Details Patient Name: [name], [name]. Date of Service: 03/26/2018 12:30 PM Medical Record Number: [id number] Patient Account Number: [id number] Date of Birth/Sex: [birth year] (72 y.o. F) Treating RN: Montey Hora Primary Care Tacara Hadlock: [name] Other Clinician: Referring Ihor Meinzer: [name] Treating Carrieanne Kleen/Extender: Tito Dine in Treatment: 5 Vital Signs Time Taken: 12:41 Temperature (F): 98.5 Height (in): 63 Pulse (bpm): 64 Weight (lbs): 219.9 Respiratory Rate (breaths/min): 16 Body Mass Index (BMI): 38.9 Blood Pressure (mmHg): 120/65 Reference Range: 80 - 120 mg / dl Electronic Signature(s) Signed: 03/26/2018 4:55:05 PM By: Montey Hora Entered By: Montey Hora on 03/26/2018 12:42:16

## 2018-04-02 ENCOUNTER — Encounter: Payer: Medicare Other | Admitting: Internal Medicine

## 2018-04-02 DIAGNOSIS — G4733 Obstructive sleep apnea (adult) (pediatric): Secondary | ICD-10-CM | POA: Diagnosis not present

## 2018-04-02 DIAGNOSIS — I890 Lymphedema, not elsewhere classified: Secondary | ICD-10-CM | POA: Diagnosis not present

## 2018-04-02 DIAGNOSIS — L97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d: Secondary | ICD-10-CM | POA: Diagnosis not present

## 2018-04-02 DIAGNOSIS — I10 Essential (primary) hypertension: Secondary | ICD-10-CM | POA: Diagnosis not present

## 2018-04-02 DIAGNOSIS — L97222 Non-pressure chronic ulcer of left calf with fat layer exposed: Secondary | ICD-10-CM | POA: Diagnosis not present

## 2018-04-02 DIAGNOSIS — J45909 Unspecified asthma, uncomplicated: Secondary | ICD-10-CM | POA: Diagnosis not present

## 2018-04-02 DIAGNOSIS — I87312 Chronic venous hypertension (idiopathic) with ulcer of left lower extremity: Secondary | ICD-10-CM | POA: Diagnosis not present

## 2018-04-02 DIAGNOSIS — I87332 Chronic venous hypertension (idiopathic) with ulcer and inflammation of left lower extremity: Secondary | ICD-10-CM | POA: Diagnosis not present

## 2018-04-03 ENCOUNTER — Ambulatory Visit: Payer: Medicare Other | Admitting: Podiatry

## 2018-04-04 ENCOUNTER — Ambulatory Visit (INDEPENDENT_AMBULATORY_CARE_PROVIDER_SITE_OTHER): Payer: Medicare Other | Admitting: Family Medicine

## 2018-04-04 ENCOUNTER — Encounter: Payer: Self-pay | Admitting: Family Medicine

## 2018-04-04 VITALS — BP 124/80 | HR 81 | Temp 98.3°F | Ht 62.0 in | Wt 224.2 lb

## 2018-04-04 DIAGNOSIS — R21 Rash and other nonspecific skin eruption: Secondary | ICD-10-CM | POA: Diagnosis not present

## 2018-04-04 DIAGNOSIS — L97929 Non-pressure chronic ulcer of unspecified part of left lower leg with unspecified severity: Secondary | ICD-10-CM

## 2018-04-04 DIAGNOSIS — I83892 Varicose veins of left lower extremities with other complications: Secondary | ICD-10-CM

## 2018-04-04 DIAGNOSIS — R609 Edema, unspecified: Secondary | ICD-10-CM | POA: Diagnosis not present

## 2018-04-04 DIAGNOSIS — I83029 Varicose veins of left lower extremity with ulcer of unspecified site: Secondary | ICD-10-CM

## 2018-04-04 NOTE — Assessment & Plan Note (Signed)
Non blanching red petechial rash isolated to few spots lower leg ?vasculitic. Etiology may be recent recurrent irritation from shoe, if worsening or progressive, consider possible medication reaction (no new meds however). Monitor for now, she will update me if not healing.

## 2018-04-04 NOTE — Assessment & Plan Note (Signed)
These have largely healed - small scab present on lower sore.

## 2018-04-04 NOTE — Progress Notes (Signed)
SKILYNN, DURNEY ([id number]) Visit Report for 04/02/2018 HPI Details Patient Name: [name], [name]. Date of Service: 04/02/2018 1:15 PM Medical Record Number: [id number] Patient Account Number: [id number] Date of Birth/Sex: [name] 09, 1946 (72 y.o. F) Treating RN: Cornell Barman Primary Care Provider: [name] Other Clinician: Referring Provider: [name] Treating Provider/Extender: Tito Dine in Treatment: 6 History of Present Illness HPI Description: Pleasant 72 year old with history of chronic venous insufficiency. No diabetes or peripheral vascular disease. Left ABI 1.29. Questionable history of left lower extremity DVT. She developed a recurrent ulceration on her left lateral calf in December 2015, which she attributes to poor diet and subsequent lower extremity edema. She underwent endovenous laser ablation of her left greater saphenous vein in 2010. She underwent laser ablation of accessory branch of left GSV in April 2016 by Dr. Kellie Simmering at [facility]. She was previously wearing Unna boots, which she tolerated well. Tolerating 2 layer compression and cadexomer iodine. She returns to clinic for follow-up and is without new complaints. She denies any significant pain at this time. She reports persistent pain with pressure. No claudication or ischemic rest pain. No fever or chills. No drainage. READMISSION 11/13/16; this is a 72 year old woman who is not a diabetic. She is here for a review of a painful area on her left medial lower extremity. I note that she was seen here previously last year for wound I believe to be in the same area. At that time she had undergone previously a left greater saphenous vein ablation by Dr. Kellie Simmering and she had a ablation of the anterior accessory branch of the left greater saphenous vein in March 2016. Seeing that the wound actually closed over. In reviewing the history with her today the ulcer in this area has been recurrent. She describes  a biopsy of this area in 2009 that only showed stasis physiology. 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 Do not see that she has ever had arterial studies in the left leg. She tells me she has a follow-up with Dr. Kellie Simmering in roughly 10 days In any case she developed the reopening of this area roughly a month ago. On the background of this she describes rapidly increasing edema which has responded to Lasix 40 mg and metolazone 2.5 mg as well as the patient's lymph massage. She has been told she has both venous insufficiency and lymphedema but she cannot tolerate compression stockings 11/28/16; the patient saw Dr. Kellie Simmering recently. Per the patient he did arterial Dopplers in the office that did not show evidence of arterial insufficiency, per the patient he stated "treat this like an ordinary venous ulcer". She also saw her dermatologist Dr. Ronnald Ramp who felt that this was more of a vascular ulcer. In general things are improving although she arrives today with increasing bilateral lower extremity edema with weeping a deeper fluid through the wound on the left medial leg compatible with some degree of lymphedema 12/04/16; the patient's wound is fully epithelialized but I don't think fully healed. We will do another week of depression with Promogran and TCA however I suspect we'll be able to discharge her next week. This is a very unusual-looking wound which was initially a figure-of-eight type wound lying on its side surrounded by petechial like hemorrhage. She  has had venous ablation on this side. She apparently does not have an arterial issue per Dr. Kellie Simmering. She saw her dermatologist thought it was "vascular". Patient is definitely going to need ongoing compression and I talked about  this with her today she will go to elastic therapy after she leaves here next week 12/11/16; the patient's wound is not completely closed today. She has surrounding scar tissue and in further discussion with the patient it would appear that she had ulcers in this area in 2009 for a prolonged period of time ultimately requiring a punch biopsy of this area that only showed venous insufficiency. I did not previously pickup on this part of the history from the patient. 12/18/16; the patient's wound is completely epithelialized. There is no open area here. She has significant bilateral venous insufficiency with secondary lymphedema to a mild-to-moderate degree she does not have compression stockings.. She did not say anything to me when I was in the room, she told our intake nurse that she was still having pain in this area. This isn't unusual recurrent small open area. She is going to go to elastic therapy to obtain compression stockings. LESSLY, STIGLER ([id number]) 12/25/16; the patient's wound is fully epithelialized. There is no open area here. The patient describes some continued episodic discomfort in this area medial left calf. However everything looks fine and healed here. She is been to elastic therapy and caught herself 15-20 mmHg stockings, they apparently were having trouble getting 20-30 mm stockings in her size 01/22/17; this is a patient we discharged from the clinic a month ago. She has a recurrent open wound on her medial left calf. She had 15 mm support stockings. I told her I thought she needed 20-30 mm compression stockings. She tells me that she has been ill with hospitalization secondary to asthma and is been found to have severe hypokalemia likely secondary to a combination of Lasix and metolazone. This morning she noted blistering and leaking fluid on the posterior part of her left leg. She called our intake nurse urgently and we was saw her this afternoon. She has not had any real  discomfort here. I don't know that she's been wearing any stockings on this leg for at least 2-3 days. ABIs in this clinic were 1.21 on the right and 1.3 on the left. She is previously seen vascular surgery who does not think that there is a peripheral arterial issue. 01/30/17; Patient arrives with no open wound on the left leg. She has been to elastic therapy and obtained 20-86mmhg below knee stockings and she has one on the right leg today. READMISSION 02/19/18; this Kissick is a now 72 year old patient we've had in this clinic perhaps 3 times before. I had last looked at her from January 07 December 2016 with an area on the medial left leg. We discharged her on 12/25/16 however she had to be readmitted on 01/22/17 with a recurrence. I have in my notes that we discharged her on 20-30 mm stockings although she tells me she was only wearing support hose because she cannot get stockings on predominantly related to her cervical spine surgery/issues. She has had previous ablations done by vein and vascular in [place] including a great saphenous vein ablation on the left with an anterior accessory branch ablation I think both of these were in 2016. On one of the previous visit she had a biopsy noted 2009 that was negative. She is not felt to have an arterial issue. She is not  a diabetic. She does have a history of obstructive sleep apnea hypertension asthma as well as chronic venous insufficiency and lymphedema. On this occasion she noted 2 dry scaly patch on her left leg. She tried to put lotion on this it didn't really help. There were 2 open areas.the patient has been seeing her primary physician from 02/05/18 through 02/14/18. She had Unna boots applied. The superior wound now on the lateral left leg has closed but she's had one wound that remains open on the lateral left leg. This is not the same spot as we dealt with in 2018. ABIs in this clinic were 1.3 bilaterally 02/26/18; patient has a small wound  on the left lateral calf. Dimensions are down. She has chronic venous insufficiency and lymphedema. 03/05/18; small open area on the left lateral calf. Dimensions are down. Tightly adherent necrotic debris over the surface of the wound which was difficult to remove. Also the dressing [over collagen] stuck to the wound surface. This was removed with some difficulty as well. Change the primary dressing to Hydrofera Blue ready 03/12/18; small open area on the left lateral calf. Comes in with tightly adherent surface eschar as well as some adherent Hydrofera Blue. 03/19/18; open area on the left lateral calf. Again adherent surface eschar as well as some adherent Hydrofera Blue nonviable subcutaneous tissue. She complained of pain all week even with the reduction from 4-3 layer compression I put on last week. Also she had an increase in her ankle and calf measurements probably related to the same thing. 03/26/18; open area on the left lateral calf. A very small open area remains here. We used silver alginate starting last week as the Hydrofera Blue seem to stick to the wound bed. In using 4-layer compression 04/02/18; the open area in the left lateral calf at some adherent slough which I removed there is no open area here. We are able to transition her into her own compression stocking. Truthfully I think this is probably his support hose. However this does not maintain skin integrity will be limited. She cannot put over the toe compression stockings on because of neck problems hand problems etc. She is allergic to the lining layer of juxta lites. We might be forced to use extremitease stocking should this fail Electronic Signature(s) Signed: 04/03/2018 8:24:56 AM By: Linton Ham MD Entered By: Linton Ham on 04/02/2018 13:54:45 Gagan, [name] ([id number]) -------------------------------------------------------------------------------- Physical Exam Details Patient Name: AMIYLAH, ANASTOS. Date of  Service: 04/02/2018 1:15 PM Medical Record Number: [id number] Patient Account Number: [id number] Date of Birth/Sex: [birth year] (72 y.o. F) Treating RN: Cornell Barman Primary Care Provider: [name] Other Clinician: Referring Provider: [name] Treating Provider/Extender: Tito Dine in Treatment: 6 Constitutional Sitting or standing Blood Pressure is within target range for patient.. Pulse regular and within target range for patient.Marland Kitchen Respirations regular, non-labored and within target range.. Temperature is normal and within the target range for the patient.Marland Kitchen appears in no distress. Notes would exam; nearing questions on the left lateral leg which is felt to be healed. She had some eschar over the area that I removed there is no open area that I can see. No evidence of surrounding erythema. Her edema control is adequate. Electronic Signature(s) Signed: 04/03/2018 8:24:56 AM By: Linton Ham MD Entered By: Linton Ham on 04/02/2018 13:55:33 Mascaro, [name] ([id number]) -------------------------------------------------------------------------------- Physician Orders Details Patient Name: HYUN, REALI. Date of Service: 04/02/2018 1:15 PM Medical Record Number: [id number] Patient Account Number: [id number] Date of  Birth/Sex: 04/13/1946 (72 y.o. F) Treating RN: Cornell Barman Primary Care Provider: [name] Other Clinician: Referring Provider: [name] Treating Provider/Extender: Tito Dine in Treatment: 6 Verbal / Phone Orders: No Diagnosis Coding Edema Control Wound #4 Left,Lateral Lower Leg o Patient to wear own compression stockings Discharge From [facility] Services Wound #4 Left,Lateral Lower Leg o Discharge from [facility] Complete Electronic Signature(s) Signed: 04/02/2018 5:21:30 PM By: [name], [name], RN, CWS, [name], [name] Signed: 04/03/2018 8:24:56 AM By: Linton Ham MD Entered By: [name], [name], RN, CWS,  Kim on 04/02/2018 13:42:45 Tritia, Endo [name] ([id number]) -------------------------------------------------------------------------------- Problem List Details Patient Name: BERTICE, RISSE. Date of Service: 04/02/2018 1:15 PM Medical Record Number: [id number] Patient Account Number: [id number] Date of Birth/Sex: October 22, 1946 (72 y.o. F) Treating RN: Cornell Barman Primary Care Provider: [name] Other Clinician: Referring Provider: [name] Treating Provider/Extender: Tito Dine in Treatment: 6 Active Problems ICD-10 Impacting Encounter Code Description Active Date Wound Healing Diagnosis L97.228 Non-pressure chronic ulcer of left calf with other specified 02/19/2018 Yes severity I89.0 Lymphedema, not elsewhere classified 02/19/2018 Yes I87.332 Chronic venous hypertension (idiopathic) with ulcer and 02/19/2018 Yes inflammation of left lower extremity Inactive Problems Resolved Problems Electronic Signature(s) Signed: 04/03/2018 8:24:56 AM By: Linton Ham MD Entered By: Linton Ham on 04/02/2018 13:52:13 Zapanta, [name] ([id number]) -------------------------------------------------------------------------------- Progress Note Details Patient Name: CRESSIDA, [name]. Date of Service: 04/02/2018 1:15 PM Medical Record Number: [id number] Patient Account Number: [id number] Date of Birth/Sex: August 05, 1946 (72 y.o. F) Treating RN: Cornell Barman Primary Care Provider: [name] Other Clinician: Referring Provider: [name] Treating Provider/Extender: Tito Dine in Treatment: 6 Subjective History of Present Illness (HPI) Pleasant 72 year old with history of chronic venous insufficiency. No diabetes or peripheral vascular disease. Left ABI 1.29. Questionable history of left lower extremity DVT. She developed a recurrent ulceration on her left lateral calf in December 2015, which she attributes to poor diet and subsequent lower extremity  edema. She underwent endovenous laser ablation of her left greater saphenous vein in 2010. She underwent laser ablation of accessory branch of left GSV in April 2016 by Dr. Kellie Simmering at [facility]. She was previously wearing Unna boots, which she tolerated well. Tolerating 2 layer compression and cadexomer iodine. She returns to clinic for follow-up and is without new complaints. She denies any significant pain at this time. She reports persistent pain with pressure. No claudication or ischemic rest pain. No fever or chills. No drainage. READMISSION 11/13/16; this is a 72 year old woman who is not a diabetic. She is here for a review of a painful area on her left medial lower extremity. I note that she was seen here previously last year for wound I believe to be in the same area. At that time she had undergone previously a left greater saphenous vein ablation by Dr. Kellie Simmering and she had a ablation of the anterior accessory branch of the left greater saphenous vein in March 2016. Seeing that the wound actually closed over. In reviewing the history with her today the ulcer in this area has been recurrent. She describes a biopsy of this area in 2009 that only showed stasis physiology. 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 Do not see that she has ever had  arterial studies in the left leg. She tells me she has a follow-up with Dr. Kellie Simmering in roughly 10 days In any case she developed the reopening of this area roughly a month ago. On the background of this she describes rapidly increasing edema which has responded to Lasix 40 mg and metolazone 2.5 mg as well as the patient's lymph massage. She has been told she has both venous insufficiency and lymphedema but she cannot tolerate compression  stockings 11/28/16; the patient saw Dr. Kellie Simmering recently. Per the patient he did arterial Dopplers in the office that did not show evidence of arterial insufficiency, per the patient he stated "treat this like an ordinary venous ulcer". She also saw her dermatologist Dr. Ronnald Ramp who felt that this was more of a vascular ulcer. In general things are improving although she arrives today with increasing bilateral lower extremity edema with weeping a deeper fluid through the wound on the left medial leg compatible with some degree of lymphedema 12/04/16; the patient's wound is fully epithelialized but I don't think fully healed. We will do another week of depression with Promogran and TCA however I suspect we'll be able to discharge her next week. This is a very unusual-looking wound which was initially a figure-of-eight type wound lying on its side surrounded by petechial like hemorrhage. She has had venous ablation on this side. She apparently does not have an arterial issue per Dr. Kellie Simmering. She saw her dermatologist thought it was "vascular". Patient is definitely going to need ongoing compression and I talked about this with her today she will go to elastic therapy after she leaves here next week 12/11/16; the patient's wound is not completely closed today. She has surrounding scar tissue and in further discussion with the patient it would appear that she had ulcers in this area in 2009 for a prolonged period of time ultimately requiring a punch biopsy of this area that only showed venous insufficiency. I did not previously pickup on this part of the history from the patient. 12/18/16; the patient's wound is completely epithelialized. There is no open area here. She has significant bilateral venous insufficiency with secondary lymphedema to a mild-to-moderate degree she does not have compression stockings.. She did not say anything to me when I was in the room, she told our intake nurse that she was still  having pain in this area. This isn't unusual recurrent small open area. She is going to go to elastic therapy to obtain compression stockings. 12/25/16; the patient's wound is fully epithelialized. There is no open area here. The patient describes some continued episodic discomfort in this area medial left calf. However everything looks fine and healed here. She is been to elastic therapy and KAMARII, BUREN ([id number]) caught herself 15-20 mmHg stockings, they apparently were having trouble getting 20-30 mm stockings in her size 01/22/17; this is a patient we discharged from the clinic a month ago. She has a recurrent open wound on her medial left calf. She had 15 mm support stockings. I told her I thought she needed 20-30 mm compression stockings. She tells me that she has been ill with hospitalization secondary to asthma and is been found to have severe hypokalemia likely secondary to a combination of Lasix and metolazone. This morning she noted blistering and leaking fluid on the posterior part of her left leg. She called our intake nurse urgently and we was saw her this afternoon. She has not had any real discomfort here. I don't know that she's been  wearing any stockings on this leg for at least 2-3 days. ABIs in this clinic were 1.21 on the right and 1.3 on the left. She is previously seen vascular surgery who does not think that there is a peripheral arterial issue. 01/30/17; Patient arrives with no open wound on the left leg. She has been to elastic therapy and obtained 20-49mmhg below knee stockings and she has one on the right leg today. READMISSION 02/19/18; this Fritzler is a now 72 year old patient we've had in this clinic perhaps 3 times before. I had last looked at her from January 07 December 2016 with an area on the medial left leg. We discharged her on 12/25/16 however she had to be readmitted on 01/22/17 with a recurrence. I have in my notes that we discharged her on 20-30 mm stockings  although she tells me she was only wearing support hose because she cannot get stockings on predominantly related to her cervical spine surgery/issues. She has had previous ablations done by vein and vascular in [place] including a great saphenous vein ablation on the left with an anterior accessory branch ablation I think both of these were in 2016. On one of the previous visit she had a biopsy noted 2009 that was negative. She is not felt to have an arterial issue. She is not a diabetic. She does have a history of obstructive sleep apnea hypertension asthma as well as chronic venous insufficiency and lymphedema. On this occasion she noted 2 dry scaly patch on her left leg. She tried to put lotion on this it didn't really help. There were 2 open areas.the patient has been seeing her primary physician from 02/05/18 through 02/14/18. She had Unna boots applied. The superior wound now on the lateral left leg has closed but she's had one wound that remains open on the lateral left leg. This is not the same spot as we dealt with in 2018. ABIs in this clinic were 1.3 bilaterally 02/26/18; patient has a small wound on the left lateral calf. Dimensions are down. She has chronic venous insufficiency and lymphedema. 03/05/18; small open area on the left lateral calf. Dimensions are down. Tightly adherent necrotic debris over the surface of the wound which was difficult to remove. Also the dressing [over collagen] stuck to the wound surface. This was removed with some difficulty as well. Change the primary dressing to Hydrofera Blue ready 03/12/18; small open area on the left lateral calf. Comes in with tightly adherent surface eschar as well as some adherent Hydrofera Blue. 03/19/18; open area on the left lateral calf. Again adherent surface eschar as well as some adherent Hydrofera Blue nonviable subcutaneous tissue. She complained of pain all week even with the reduction from 4-3 layer compression I put on  last week. Also she had an increase in her ankle and calf measurements probably related to the same thing. 03/26/18; open area on the left lateral calf. A very small open area remains here. We used silver alginate starting last week as the Hydrofera Blue seem to stick to the wound bed. In using 4-layer compression 04/02/18; the open area in the left lateral calf at some adherent slough which I removed there is no open area here. We are able to transition her into her own compression stocking. Truthfully I think this is probably his support hose. However this does not maintain skin integrity will be limited. She cannot put over the toe compression stockings on because of neck problems hand problems etc. She is allergic to  the lining layer of juxta lites. We might be forced to use extremitease stocking should this fail Objective Constitutional Sitting or standing Blood Pressure is within target range for patient.. Pulse regular and within target range for patient.Marland Kitchen Respirations regular, non-labored and within target range.. Temperature is normal and within the target range for the patient.Marland Kitchen appears in no distress. LEAH, THORNBERRY ([id number]) Vitals Time Taken: 1:22 PM, Height: 63 in, Weight: 219.9 lbs, BMI: 38.9, Temperature: 98.2 F, Pulse: 80 bpm, Respiratory Rate: 18 breaths/min, Blood Pressure: 120/65 mmHg. General Notes: would exam; nearing questions on the left lateral leg which is felt to be healed. She had some eschar over the area that I removed there is no open area that I can see. No evidence of surrounding erythema. Her edema control is adequate. Integumentary (Hair, Skin) Wound #4 status is Healed - Epithelialized. Original cause of wound was Gradually Appeared. The wound is located on the Left,Lateral Lower Leg. The wound measures 0cm length x 0cm width x 0cm depth; 0cm^2 area and 0cm^3 volume. There is no tunneling or undermining noted. There is a small amount of serosanguineous  drainage noted. The wound margin is flat and intact. There is medium (34-66%) red granulation within the wound bed. There is a medium (34-66%) amount of necrotic tissue within the wound bed including Eschar. The periwound skin appearance did not exhibit: Dry/Scaly, Maceration. Periwound temperature was noted as No Abnormality. The periwound has tenderness on palpation. Assessment Active Problems ICD-10 L97.228 - Non-pressure chronic ulcer of left calf with other specified severity I89.0 - Lymphedema, not elsewhere classified I87.332 - Chronic venous hypertension (idiopathic) with ulcer and inflammation of left lower extremity Plan Edema Control: Wound #4 Left,Lateral Lower Leg: Patient to wear own compression stockings Discharge From [facility] Services: Wound #4 Left,Lateral Lower Leg: Discharge from [place] - Treatment Complete #1 the patient can be discharged from the wound care center #2 I'm discharging her in her own compression stockings/support stockings Electronic Signature(s) Signed: 04/03/2018 8:24:56 AM By: Linton Ham MD Entered By: Linton Ham on 04/02/2018 13:56:06 Mucci, [name] ([id number]) -------------------------------------------------------------------------------- SuperBill Details Patient Name: LIDYA, MCCALISTER. Date of Service: 04/02/2018 Medical Record Number: [id number] Patient Account Number: [id number] Date of Birth/Sex: 1946-09-16 (72 y.o. F) Treating RN: Cornell Barman Primary Care Provider: [name] Other Clinician: Referring Provider: [name] Treating Provider/Extender: Tito Dine in Treatment: 6 Diagnosis Coding ICD-10 Codes Code Description [phone] Non-pressure chronic ulcer of left calf with other specified severity I89.0 Lymphedema, not elsewhere classified I87.332 Chronic venous hypertension (idiopathic) with ulcer and inflammation of left lower extremity Facility Procedures CPT4 Code:  [id number] Description: [phone] - WOUND CARE VISIT-LEV 2 EST PT Modifier: Quantity: 1 Physician Procedures CPT4 Code: [id number] Description: 41937 - WC PHYS LEVEL 2 - EST PT ICD-10 Diagnosis Description L97.228 Non-pressure chronic ulcer of left calf with other specified I89.0 Lymphedema, not elsewhere classified Modifier: severity Quantity: 1 Electronic Signature(s) Signed: 04/03/2018 8:24:56 AM By: Linton Ham MD Entered By: Linton Ham on 04/02/2018 13:56:27

## 2018-04-04 NOTE — Patient Instructions (Signed)
This looks like blood vessel type rash - let's watch for now. Should improve over time. If spreading let us know - we may consider medicines as a cause.  I prefer creams to lotions.

## 2018-04-04 NOTE — Progress Notes (Signed)
BP 124/80 (BP Location: Left Arm, Patient Position: Sitting, Cuff Size: Large)   Pulse 81   Temp 98.3 F (36.8 C) (Oral)   Ht 5\' 2"  (1.575 m)   Wt 224 lb 4 oz (101.7 kg)   SpO2 94%   BMI 41.02 kg/m    CC: rash Subjective:    Patient ID: [name], female    DOB: 30-Jun-1946, 72 y.o.   MRN: [id number]  HPI: MIKKA KISSNER is a 72 y.o. female presenting on 04/04/2018 for Rash (Located on left LE due to bandage. D/c from wound center on 04/02/18. Suggested pt see PCP. )   Recent discharge from wound clinic 5/29 - they were treating chronic venous stasis ulcers of LLE. Wounds finally healed after 8 wks using 4 layer sock treatment. She has continued using knee high compression stockings.   No new medicines, lotions, creams, detergents or shampoos.  She started using cetaphil lotion.   Relevant past medical, surgical, family and social history reviewed and updated as indicated. Interim medical history since our last visit reviewed. Allergies and medications reviewed and updated. Outpatient Medications Prior to Visit  Medication Sig Dispense Refill  . acetaminophen (TYLENOL ARTHRITIS PAIN) 650 MG CR tablet Take 1,300 mg by mouth every 8 (eight) hours as needed for pain.     Marland Kitchen albuterol (PROAIR HFA) 108 (90 Base) MCG/ACT inhaler INHALE 2 PUFFS INTO LUNGS EVERY 6 HOURS AS NEEDED FOR WHEEZING OR SHORTNESS OF BREATH 8.5 Inhaler 1  . albuterol (PROVENTIL) (2.5 MG/3ML) 0.083% nebulizer solution Take 3 mLs (2.5 mg total) by nebulization every 6 (six) hours as needed for wheezing or shortness of breath. 150 mL 1  . aspirin EC 81 MG tablet Take 81 mg by mouth at bedtime. Melanoma prevention    . cetirizine (ZYRTEC) 10 MG tablet Take 10 mg by mouth daily.    . famotidine (PEPCID) 10 MG tablet Take 10 mg by mouth daily.    . furosemide (LASIX) 40 MG tablet TAKE 1 TABLET BY MOUTH EVERY DAY 30 tablet 5  . gabapentin (NEURONTIN) 100 MG capsule TAKE 1 CAPSULE (100 MG TOTAL) BY MOUTH AT BEDTIME. 30  capsule 3  . losartan (COZAAR) 50 MG tablet TAKE 1 TABLET EVERY DAY 30 tablet 5  . montelukast (SINGULAIR) 10 MG tablet TAKE 1 TABLET BY MOUTH DAILY 30 tablet 5  . Multiple Vitamin (MULTIVITAMIN WITH MINERALS) TABS tablet Take 1 tablet by mouth daily.    . Polyvinyl Alcohol-Povidone (TEARS PLUS OP) Place 1 drop into both eyes daily as needed (dry eyes/ redness/ burning).    . potassium chloride SA (K-DUR,KLOR-CON) 20 MEQ tablet Take 1 tablet (20 mEq total) by mouth 2 (two) times daily. 60 tablet 6  . PRESCRIPTION MEDICATION CPAP    . Probiotic Product (PROBIOTIC DAILY PO) Take 1 tablet by mouth daily.     Marland Kitchen spironolactone (ALDACTONE) 25 MG tablet TAKE 1/2 TABLET BY MOUTH DAILY. 15 tablet 5  . triamcinolone (NASACORT ALLERGY 24HR) 55 MCG/ACT AERO nasal inhaler Place 2 sprays into the nose daily. In each nostril    . Vitamin D, Ergocalciferol, (DRISDOL) 50000 units CAPS capsule TAKE 1 CAPSULE (50,000 UNITS TOTAL) BY MOUTH EVERY 7 (SEVEN) DAYS. 12 capsule 3  . Simethicone (GAS-X PO) Take 1 tablet by mouth daily as needed (gas).    . triamcinolone ointment (KENALOG) 0.5 % Apply 1 application topically 2 (two) times daily. 30 g 1   No facility-administered medications prior to visit.  Per HPI unless specifically indicated in ROS section below Review of Systems     Objective:    BP 124/80 (BP Location: Left Arm, Patient Position: Sitting, Cuff Size: Large)   Pulse 81   Temp 98.3 F (36.8 C) (Oral)   Ht 5\' 2"  (1.575 m)   Wt 224 lb 4 oz (101.7 kg)   SpO2 94%   BMI 41.02 kg/m   Wt Readings from Last 3 Encounters:  04/04/18 224 lb 4 oz (101.7 kg)  02/14/18 223 lb (101.2 kg)  02/13/18 219 lb (99.3 kg)    Physical Exam  Constitutional: She appears well-developed and well-nourished. No distress.  Musculoskeletal: She exhibits edema.  Regular stockings in place LLE - largely healed lateral ulcers. nonblanching petechial type spots medial and anterior lower leg, not pruritic or  significant erythema  Skin: Rash noted.  Nursing note and vitals reviewed.       Assessment & Plan:   Problem List Items Addressed This Visit    Skin rash - Primary    Non blanching red petechial rash isolated to few spots lower leg ?vasculitic. Etiology may be recent recurrent irritation from shoe, if worsening or progressive, consider possible medication reaction (no new meds however). Monitor for now, she will update me if not healing.       Venous stasis ulcer of left lower leg with edema of left lower leg (HCC)    These have largely healed - small scab present on lower sore.           No orders of the defined types were placed in this encounter.  No orders of the defined types were placed in this encounter.   Follow up plan: No follow-ups on file.  [name], MD

## 2018-04-06 NOTE — Progress Notes (Signed)
ALONZO, LOVING ([id number]) Visit Report for 04/02/2018 Arrival Information Details Patient Name: [name], [name]. Date of Service: 04/02/2018 1:15 PM Medical Record Number: [id number] Patient Account Number: [id number] Date of Birth/Sex: Sep 04, 1946 (72 y.o. F) Treating RN: Ahmed Prima Primary Care Sharalyn Lomba: [name] Other Clinician: Referring Alexxia Stankiewicz: [name] Treating Miyana Mordecai/Extender: Tito Dine in Treatment: 6 Visit Information History Since Last Visit All ordered tests and consults were completed: No Patient Arrived: Ambulatory Added or deleted any medications: No Arrival Time: 13:21 Any new allergies or adverse reactions: No Accompanied By: self Had a fall or experienced change in No Transfer Assistance: None activities of daily living that may affect Patient Identification Verified: Yes risk of falls: Secondary Verification Process Completed: Yes Signs or symptoms of abuse/neglect since last visito No Patient Requires Transmission-Based No Hospitalized since last visit: No Precautions: Implantable device outside of the clinic excluding No Patient Has Alerts: No cellular tissue based products placed in the center since last visit: Has Dressing in Place as Prescribed: Yes Has Compression in Place as Prescribed: Yes Pain Present Now: No Electronic Signature(s) Signed: 04/04/2018 5:38:20 PM By: Alric Quan Entered By: Alric Quan on 04/02/2018 13:22:16 [name], [name] ([id number]) -------------------------------------------------------------------------------- Clinic Level of Care Assessment Details Patient Name: [name] Date of Service: 04/02/2018 1:15 PM Medical Record Number: [id number] Patient Account Number: [id number] Date of Birth/Sex: [birth year] (72 y.o. F) Treating RN: Cornell Barman Primary Care Phong Isenberg: [name] Other Clinician: Referring Teddie Curd: [name] Treating Kenith Trickel/Extender: Tito Dine in Treatment: 6 Clinic Level of Care Assessment Items TOOL 4 Quantity Score []  - Use when only an EandM is performed on FOLLOW-UP visit 0 ASSESSMENTS - Nursing Assessment / Reassessment []  - Reassessment of Co-morbidities (includes updates in patient status) 0 X- 1 5 Reassessment of Adherence to Treatment Plan ASSESSMENTS - Wound and Skin Assessment / Reassessment X - Simple Wound Assessment / Reassessment - one wound 1 5 []  - 0 Complex Wound Assessment / Reassessment - multiple wounds []  - 0 Dermatologic / Skin Assessment (not related to wound area) ASSESSMENTS - Focused Assessment []  - Circumferential Edema Measurements - multi extremities 0 []  - 0 Nutritional Assessment / Counseling / Intervention []  - 0 Lower Extremity Assessment (monofilament, tuning fork, pulses) []  - 0 Peripheral Arterial Disease Assessment (using hand held doppler) ASSESSMENTS - Ostomy and/or Continence Assessment and Care []  - Incontinence Assessment and Management 0 []  - 0 Ostomy Care Assessment and Management (repouching, etc.) PROCESS - Coordination of Care X - Simple Patient / Family Education for ongoing care 1 15 []  - 0 Complex (extensive) Patient / Family Education for ongoing care []  - 0 Staff obtains Programmer, systems, Records, Test Results / Process Orders []  - 0 Staff telephones HHA, Nursing Homes / Clarify orders / etc []  - 0 Routine Transfer to another Facility (non-emergent condition) []  - 0 Routine Hospital Admission (non-emergent condition) []  - 0 New Admissions / Biomedical engineer / Ordering NPWT, Apligraf, etc. []  - 0 Emergency Hospital Admission (emergent condition) X- 1 10 Simple Discharge Coordination [name], [name] ([id number]) []  - 0 Complex (extensive) Discharge Coordination PROCESS - Special Needs []  - Pediatric / Minor Patient Management 0 []  - 0 Isolation Patient Management []  - 0 Hearing / Language / Visual special needs []  - 0 Assessment of  Community assistance (transportation, D/C planning, etc.) []  - 0 Additional assistance / Altered mentation []  - 0 Support Surface(s) Assessment (bed, cushion, seat, etc.) INTERVENTIONS - Wound Cleansing / Measurement X -  Simple Wound Cleansing - one wound 1 5 []  - 0 Complex Wound Cleansing - multiple wounds X- 1 5 Wound Imaging (photographs - any number of wounds) []  - 0 Wound Tracing (instead of photographs) X- 1 5 Simple Wound Measurement - one wound []  - 0 Complex Wound Measurement - multiple wounds INTERVENTIONS - Wound Dressings []  - Small Wound Dressing one or multiple wounds 0 []  - 0 Medium Wound Dressing one or multiple wounds []  - 0 Large Wound Dressing one or multiple wounds []  - 0 Application of Medications - topical []  - 0 Application of Medications - injection INTERVENTIONS - Miscellaneous []  - External ear exam 0 []  - 0 Specimen Collection (cultures, biopsies, blood, body fluids, etc.) []  - 0 Specimen(s) / Culture(s) sent or taken to Lab for analysis []  - 0 Patient Transfer (multiple staff / Civil Service fast streamer / Similar devices) []  - 0 Simple Staple / Suture removal (25 or less) []  - 0 Complex Staple / Suture removal (26 or more) []  - 0 Hypo / Hyperglycemic Management (close monitor of Blood Glucose) []  - 0 Ankle / Brachial Index (ABI) - do not check if billed separately X- 1 5 Vital Signs [name], [name] ([id number]) Has the patient been seen at the hospital within the last three years: Yes Total Score: 55 Level Of Care: New/Established - Level 2 Electronic Signature(s) Signed: 04/02/2018 5:21:30 PM By: [name], [name], RN, CWS, [name], [name] Entered By: [name], [name], RN, CWS, Kim on 04/02/2018 13:43:17 [name], [name] ([id number]) -------------------------------------------------------------------------------- Encounter Discharge Information Details Patient Name: [name], [name]. Date of Service: 04/02/2018 1:15 PM Medical Record Number: [id number] Patient  Account Number: [id number] Date of Birth/Sex: 09/19/1946 (72 y.o. F) Treating RN: Cornell Barman Primary Care Asami Lambright: [name] Other Clinician: Referring Shakhia Gramajo: [name] Treating Xaidyn Kepner/Extender: Tito Dine in Treatment: 6 Encounter Discharge Information Items Discharge Condition: Stable Ambulatory Status: Ambulatory Discharge Destination: Home Transportation: Private Auto Accompanied By: self Schedule Follow-up Appointment: Yes Clinical Summary of Care: Electronic Signature(s) Signed: 04/02/2018 5:21:30 PM By: [name], [name], RN, CWS, [name], [name] Entered By: [name], [name], RN, CWS, Kim on 04/02/2018 13:48:30 [name] ([id number]) -------------------------------------------------------------------------------- Lower Extremity Assessment Details Patient Name: [name], [name]. Date of Service: 04/02/2018 1:15 PM Medical Record Number: [id number] Patient Account Number: [id number] Date of Birth/Sex: 1946-08-02 (72 y.o. F) Treating RN: Ahmed Prima Primary Care Armella Stogner: [name] Other Clinician: Referring Kaesyn Johnston: [name] Treating Kerrin Markman/Extender: Tito Dine in Treatment: 6 Edema Assessment Assessed: [Left: No] [Right: No] [Left: Edema] [Right: :] Calf Left: Right: Point of Measurement: 32 cm From Medial Instep 42 cm cm Ankle Left: Right: Point of Measurement: 12 cm From Medial Instep 23 cm cm Vascular Assessment Pulses: Dorsalis Pedis Palpable: [Left:Yes] Posterior Tibial Extremity colors, hair growth, and conditions: Extremity Color: [Left:Normal] Temperature of Extremity: [Left:Warm] Capillary Refill: [Left:< 3 seconds] Toe Nail Assessment Left: Right: Thick: Yes Discolored: Yes Deformed: Yes Improper Length and Hygiene: Yes Electronic Signature(s) Signed: 04/04/2018 5:38:20 PM By: Alric Quan Entered By: Alric Quan on 04/02/2018 13:29:06 Vanwinkle, [name]  ([id number]) -------------------------------------------------------------------------------- Multi Wound Chart Details Patient Name: [name], [name]. Date of Service: 04/02/2018 1:15 PM Medical Record Number: [id number] Patient Account Number: [id number] Date of Birth/Sex: [birth year] (72 y.o. F) Treating RN: Cornell Barman Primary Care Jakyria Bleau: [name] Other Clinician: Referring Aymar Whitfill: [name] Treating Letzy Gullickson/Extender: Tito Dine in Treatment: 6 Vital Signs Height(in): 63 Pulse(bpm): 80 Weight(lbs): 219.9 Blood Pressure(mmHg): 120/65 Body Mass Index(BMI): 39 Temperature(F):  98.2 Respiratory Rate 18 (breaths/min): Photos: [4:No Photos] [N/A:N/A] Wound Location: [4:Left, Lateral Lower Leg] [N/A:N/A] Wounding Event: [4:Gradually Appeared] [N/A:N/A] Primary Etiology: [4:Venous Leg Ulcer] [N/A:N/A] Comorbid History: [4:Cataracts, Asthma, Sleep Apnea, Deep Vein Thrombosis, Hypertension, Peripheral Venous Disease, Osteoarthritis, Received Chemotherapy, Received Radiation] [N/A:N/A] Date Acquired: [4:02/05/2018] [N/A:N/A] Weeks of Treatment: [4:6] [N/A:N/A] Wound Status: [4:Healed - Epithelialized] [N/A:N/A] Measurements L x W x D [4:0x0x0] [N/A:N/A] (cm) Area (cm) : [4:0] [N/A:N/A] Volume (cm) : [4:0] [N/A:N/A] % Reduction in Area: [4:100.00%] [N/A:N/A] % Reduction in Volume: [4:100.00%] [N/A:N/A] Classification: [4:Full Thickness Without Exposed Support Structures] [N/A:N/A] Exudate Amount: [4:Small] [N/A:N/A] Exudate Type: [4:Serosanguineous] [N/A:N/A] Exudate Color: [4:red, brown] [N/A:N/A] Wound Margin: [4:Flat and Intact] [N/A:N/A] Granulation Amount: [4:Medium (34-66%)] [N/A:N/A] Granulation Quality: [4:Red] [N/A:N/A] Necrotic Amount: [4:Medium (34-66%)] [N/A:N/A] Necrotic Tissue: [4:Eschar] [N/A:N/A] Exposed Structures: [4:Fascia: No Fat Layer (Subcutaneous Tissue) Exposed: No Tendon: No Muscle: No Joint: No Bone: No]  [N/A:N/A] Epithelialization: Small (1-33%) N/A N/A Periwound Skin Texture: No Abnormalities Noted N/A N/A Periwound Skin Moisture: Maceration: No N/A N/A Dry/Scaly: No Periwound Skin Color: No Abnormalities Noted N/A N/A Temperature: No Abnormality N/A N/A Tenderness on Palpation: Yes N/A N/A Wound Preparation: Ulcer Cleansing: N/A N/A Rinsed/Irrigated with Saline Topical Anesthetic Applied: Other: lidocaine 4% Treatment Notes Electronic Signature(s) Signed: 04/03/2018 8:24:56 AM By: Linton Ham MD Entered By: Linton Ham on 04/02/2018 13:52:44 [name], [name] ([id number]) -------------------------------------------------------------------------------- Multi-Disciplinary Care Plan Details Patient Name: [name], [name]. Date of Service: 04/02/2018 1:15 PM Medical Record Number: [id number] Patient Account Number: [id number] Date of Birth/Sex: 04-08-1946 (72 y.o. F) Treating RN: Cornell Barman Primary Care Morrill Bomkamp: [name] Other Clinician: Referring Zelta Enfield: [name] Treating Harshal Sirmon/Extender: Tito Dine in Treatment: 6 Active Inactive Electronic Signature(s) Signed: 04/03/2018 1:03:48 PM By: [name], [name], RN, CWS, [name], [name] Previous Signature: 04/02/2018 5:21:30 PM Version By: [name], [name], RN, CWS, [name], [name] Entered By: [name], [name], RN, CWS, Kim on 04/03/2018 13:03:47 [name] ([id number]) -------------------------------------------------------------------------------- Pain Assessment Details Patient Name: [name], [name]. Date of Service: 04/02/2018 1:15 PM Medical Record Number: [id number] Patient Account Number: [id number] Date of Birth/Sex: 15-Aug-1946 (72 y.o. F) Treating RN: Ahmed Prima Primary Care Aianna Fahs: [name] Other Clinician: Referring Ramez Arrona: [name] Treating Lenyx Boody/Extender: Tito Dine in Treatment: 6 Active Problems Location of Pain Severity and Description of Pain Patient Has  Paino No Site Locations Pain Management and Medication Current Pain Management: Electronic Signature(s) Signed: 04/04/2018 5:38:20 PM By: Alric Quan Entered By: Alric Quan on 04/02/2018 13:22:23 [name], [name] ([id number]) -------------------------------------------------------------------------------- Patient/Caregiver Education Details Patient Name: [name], [name]. Date of Service: 04/02/2018 1:15 PM Medical Record Number: [id number] Patient Account Number: [id number] Date of Birth/Gender: [birth year] (72 y.o. F) Treating RN: Cornell Barman Primary Care Physician: [name] Other Clinician: Referring Physician: [name] Treating Physician/Extender: Tito Dine in Treatment: 6 Education Assessment Education Provided To: Patient Education Topics Provided Venous: Handouts: Controlling Swelling with Compression Stockings Methods: Demonstration, Explain/Verbal Responses: State content correctly Electronic Signature(s) Signed: 04/02/2018 5:21:30 PM By: [name], [name], RN, CWS, [name], [name] Entered By: [name], [name], RN, CWS, Kim on 04/02/2018 13:48:44 [name] ([id number]) -------------------------------------------------------------------------------- Wound Assessment Details Patient Name: [name], VIGNOLA. Date of Service: 04/02/2018 1:15 PM Medical Record Number: [id number] Patient Account Number: [id number] Date of Birth/Sex: [birth year] (72 y.o. F) Treating RN: Cornell Barman Primary Care Jeury Mcnab: [name] Other Clinician: Referring Randeep Biondolillo: [name] Treating Elizjah Noblet/Extender: [name] Dillon Weeks in Treatment: 6 Wound Status Wound Number: 4 Primary Venous Leg  Ulcer Etiology: Wound Location: Left, Lateral Lower Leg Wound Healed - Epithelialized Wounding Event: Gradually Appeared Status: Date Acquired: 02/05/2018 Comorbid Cataracts, Asthma, Sleep Apnea, Deep Vein Weeks Of Treatment: 6 History: Thrombosis, Hypertension,  Peripheral Venous Clustered Wound: No Disease, Osteoarthritis, Received Chemotherapy, Received Radiation Photos Photo Uploaded By: Alric Quan on 04/03/2018 08:21:54 Wound Measurements Length: (cm) 0 Width: (cm) 0 Depth: (cm) 0 Area: (cm) 0 Volume: (cm) 0 % Reduction in Area: 100% % Reduction in Volume: 100% Epithelialization: Small (1-33%) Tunneling: No Undermining: No Wound Description Full Thickness Without Exposed Support Classification: Structures Wound Margin: Flat and Intact Exudate Small Amount: Exudate Type: Serosanguineous Exudate Color: red, brown Foul Odor After Cleansing: No Slough/Fibrino Yes Wound Bed Granulation Amount: Medium (34-66%) Exposed Structure Granulation Quality: Red Fascia Exposed: No Necrotic Amount: Medium (34-66%) Fat Layer (Subcutaneous Tissue) Exposed: No Necrotic Quality: Eschar Tendon Exposed: No Muscle Exposed: No Joint Exposed: No Bone Exposed: No Periwound Skin Texture Oliveto, Shenae J. ([id number]) Texture Color No Abnormalities Noted: No No Abnormalities Noted: No Moisture Temperature / Pain No Abnormalities Noted: No Temperature: No Abnormality Dry / Scaly: No Tenderness on Palpation: Yes Maceration: No Wound Preparation Ulcer Cleansing: Rinsed/Irrigated with Saline Topical Anesthetic Applied: Other: lidocaine 4%, Electronic Signature(s) Signed: 04/02/2018 5:21:30 PM By: [name], [name], RN, CWS, [name], [name] Entered By: [name], [name], RN, CWS, Kim on 04/02/2018 13:47:52 Schuchart, [name] ([id number]) -------------------------------------------------------------------------------- Vitals Details Patient Name: AMAHIA, MADONIA. Date of Service: 04/02/2018 1:15 PM Medical Record Number: [id number] Patient Account Number: [id number] Date of Birth/Sex: [birth year] (72 y.o. F) Treating RN: Ahmed Prima Primary Care Saron Tweed: [name] Other Clinician: Referring Taytum Scheck: [name] Treating  Thekla Colborn/Extender: Tito Dine in Treatment: 6 Vital Signs Time Taken: 13:22 Temperature (F): 98.2 Height (in): 63 Pulse (bpm): 80 Weight (lbs): 219.9 Respiratory Rate (breaths/min): 18 Body Mass Index (BMI): 38.9 Blood Pressure (mmHg): 120/65 Reference Range: 80 - 120 mg / dl Electronic Signature(s) Signed: 04/04/2018 5:38:20 PM By: Alric Quan Entered By: Alric Quan on 04/02/2018 13:24:19

## 2018-04-07 ENCOUNTER — Encounter: Payer: Self-pay | Admitting: Podiatry

## 2018-04-07 ENCOUNTER — Ambulatory Visit (INDEPENDENT_AMBULATORY_CARE_PROVIDER_SITE_OTHER): Payer: Medicare Other | Admitting: Podiatry

## 2018-04-07 DIAGNOSIS — M79676 Pain in unspecified toe(s): Secondary | ICD-10-CM

## 2018-04-07 DIAGNOSIS — I872 Venous insufficiency (chronic) (peripheral): Secondary | ICD-10-CM

## 2018-04-07 DIAGNOSIS — B351 Tinea unguium: Secondary | ICD-10-CM | POA: Diagnosis not present

## 2018-04-07 NOTE — Progress Notes (Signed)
Complaint:  Visit Type: Patient returns to my office for continued preventative foot care services. Complaint: Patient states" my nails have grown long and thick and become painful to walk and wear shoes"  The patient presents for preventative foot care services. No changes to ROS.  Patient says she has been treated for ulcer left leg due to her swelling .  Her ankle pain and swelling persists.  Podiatric Exam: Vascular: dorsalis pedis and posterior tibial pulses are palpable bilateral. Capillary return is immediate. Temperature gradient is WNL. Skin turgor WNL Varicosities  Feet  B/L  Swelling legs/ankle  B/L. Sensorium: Normal Semmes Weinstein monofilament test. Normal tactile sensation bilaterally. Nail Exam: Pt has thick disfigured discolored nails with subungual debris noted bilateral entire nail hallux through fifth toenails Ulcer Exam: There is no evidence of ulcer or pre-ulcerative changes or infection. Orthopedic Exam: Muscle tone and strength are WNL. No limitations in general ROM. No crepitus or effusions noted. Foot type and digits show no abnormalities. DJD  [name]  B/L Swelling ankles  B/L Skin: No Porokeratosis. No infection or ulcers.  Asymptomatic clavi.  Diagnosis:  Onychomycosis, , Pain in right toe, pain in left toes  Treatment & Plan Procedures and Treatment: Consent by patient was obtained for treatment procedures. The patient understood the discussion of treatment and procedures well. All questions were answered thoroughly reviewed. Debridement of mycotic and hypertrophic toenails, 1 through 5 bilateral and clearing of subungual debris. No ulceration, no infection noted. Patient requests minimal  dremel useleft hallux. Return Visit-Office Procedure: Patient instructed to return to the office for a follow up visit 3 months for continued evaluation and treatment.    Gardiner Barefoot DPM

## 2018-04-14 ENCOUNTER — Ambulatory Visit: Payer: Medicare Other | Admitting: Family Medicine

## 2018-04-15 ENCOUNTER — Encounter: Payer: Self-pay | Admitting: Family Medicine

## 2018-04-15 ENCOUNTER — Ambulatory Visit (INDEPENDENT_AMBULATORY_CARE_PROVIDER_SITE_OTHER): Payer: Medicare Other | Admitting: Family Medicine

## 2018-04-15 VITALS — BP 124/78 | HR 62 | Temp 98.2°F | Ht 62.0 in | Wt 223.2 lb

## 2018-04-15 DIAGNOSIS — M25572 Pain in left ankle and joints of left foot: Secondary | ICD-10-CM | POA: Diagnosis not present

## 2018-04-15 LAB — BASIC METABOLIC PANEL
BUN: 19 mg/dL (ref 6–23)
CO2: 30 mEq/L (ref 19–32)
Calcium: 9.9 mg/dL (ref 8.4–10.5)
Chloride: 105 mEq/L (ref 96–112)
Creatinine, Ser: 0.87 mg/dL (ref 0.40–1.20)
GFR: 68.07 mL/min (ref >60.00)
Glucose, Bld: 103 mg/dL — ABNORMAL HIGH (ref 70–99)
Potassium: 4.2 mEq/L (ref 3.5–5.1)
Sodium: 141 mEq/L (ref 135–145)

## 2018-04-15 LAB — CBC WITH DIFFERENTIAL/PLATELET
Basophils Absolute: 0 10*3/uL (ref 0.0–0.1)
Basophils Relative: 0.4 % (ref 0.0–3.0)
Eosinophils Absolute: 0.2 10*3/uL (ref 0.0–0.7)
Eosinophils Relative: 2.9 % (ref 0.0–5.0)
HCT: 39.3 % (ref 36.0–46.0)
Hemoglobin: 13.1 g/dL (ref 12.0–15.0)
Lymphocytes Relative: 30.7 % (ref 12.0–46.0)
Lymphs Abs: 2.1 10*3/uL (ref 0.7–4.0)
MCHC: 33.5 g/dL (ref 30.0–36.0)
MCV: 92 fl (ref 78.0–100.0)
Monocytes Absolute: 0.6 10*3/uL (ref 0.1–1.0)
Monocytes Relative: 8.4 % (ref 3.0–12.0)
Neutro Abs: 3.9 10*3/uL (ref 1.4–7.7)
Neutrophils Relative %: 57.6 % (ref 43.0–77.0)
Platelets: 203 10*3/uL (ref 150.0–400.0)
RBC: 4.27 Mil/uL (ref 3.87–5.11)
RDW: 13.2 % (ref 11.5–15.5)
WBC: 6.7 10*3/uL (ref 4.0–10.5)

## 2018-04-15 LAB — URIC ACID: Uric Acid, Serum: 5.8 mg/dL (ref 2.4–7.0)

## 2018-04-15 MED ORDER — COLCHICINE 0.6 MG PO TABS: 0.6000 mg | ORAL_TABLET | Freq: Every day | ORAL | 0 refills | Status: DC | PRN

## 2018-04-15 NOTE — Patient Instructions (Addendum)
This may be gout flare or some form of inflammatory arthritis.  Check labs today.  Take advil 400mg  with meals for next 3 days, may try colchicine as well (gout medicine) over next few days (once daily as needed for gout).

## 2018-04-15 NOTE — Assessment & Plan Note (Signed)
Suspicious for inflammatory arthritis ?gout. Check UA, CBC, BMP. Rx OTC ibuprofen 400mg  TID with meals x 3 days (limited course), colchicine one daily PRN pain. Update with effect after 3 days. Reviewed pathophysiology of gouty arthritis.

## 2018-04-15 NOTE — Progress Notes (Signed)
BP 124/78 (BP Location: Left Arm, Patient Position: Sitting, Cuff Size: Large)   Pulse 62   Temp 98.2 F (36.8 C) (Oral)   Ht 5\' 2"  (1.575 m)   Wt 223 lb 4 oz (101.3 kg)   SpO2 96%   BMI 40.83 kg/m    CC: L ankle swelling Subjective:    Patient ID: [name], female    DOB: 1946-06-02, 72 y.o.   MRN: [id number]  HPI: [name] is a 72 y.o. female presenting on 04/15/2018 for Joint Swelling (Swelling in left ankle. Tender to touch.)   Known chronic venous insufficiency with resultant venous stasis ulcers of BLE latest LLE s/p wound clinic eval released from their care 04/02/2018. Cetaphil helped red rash and swelling.   A few days ago top of left foot started burning. Swelling as well as tender to touch and warm. Has been wearing regular socks. Has been treating with topical cortisone 10.    Denies inciting trauma or injury. Denies bug bites. No recent abx use.  No h/o gout.  Dependent edema - better with leg elevation.   No increase in shrimp, red meat or organ meats. No alcohol use.   Relevant past medical, surgical, family and social history reviewed and updated as indicated. Interim medical history since our last visit reviewed. Allergies and medications reviewed and updated. Outpatient Medications Prior to Visit  Medication Sig Dispense Refill  . acetaminophen (TYLENOL ARTHRITIS PAIN) 650 MG CR tablet Take 1,300 mg by mouth every 8 (eight) hours as needed for pain.     Marland Kitchen albuterol (PROAIR HFA) 108 (90 Base) MCG/ACT inhaler INHALE 2 PUFFS INTO LUNGS EVERY 6 HOURS AS NEEDED FOR WHEEZING OR SHORTNESS OF BREATH 8.5 Inhaler 1  . albuterol (PROVENTIL) (2.5 MG/3ML) 0.083% nebulizer solution Take 3 mLs (2.5 mg total) by nebulization every 6 (six) hours as needed for wheezing or shortness of breath. 150 mL 1  . aspirin EC 81 MG tablet Take 81 mg by mouth at bedtime. Melanoma prevention    . cetirizine (ZYRTEC) 10 MG tablet Take 10 mg by mouth daily.    . famotidine (PEPCID)  10 MG tablet Take 10 mg by mouth daily.    . furosemide (LASIX) 40 MG tablet TAKE 1 TABLET BY MOUTH EVERY DAY 30 tablet 5  . gabapentin (NEURONTIN) 100 MG capsule TAKE 1 CAPSULE (100 MG TOTAL) BY MOUTH AT BEDTIME. 30 capsule 3  . losartan (COZAAR) 50 MG tablet TAKE 1 TABLET EVERY DAY 30 tablet 5  . montelukast (SINGULAIR) 10 MG tablet TAKE 1 TABLET BY MOUTH DAILY 30 tablet 5  . Multiple Vitamin (MULTIVITAMIN WITH MINERALS) TABS tablet Take 1 tablet by mouth daily.    . Polyvinyl Alcohol-Povidone (TEARS PLUS OP) Place 1 drop into both eyes daily as needed (dry eyes/ redness/ burning).    . potassium chloride SA (K-DUR,KLOR-CON) 20 MEQ tablet Take 1 tablet (20 mEq total) by mouth 2 (two) times daily. 60 tablet 6  . PRESCRIPTION MEDICATION CPAP    . Probiotic Product (PROBIOTIC DAILY PO) Take 1 tablet by mouth daily.     Marland Kitchen spironolactone (ALDACTONE) 25 MG tablet TAKE 1/2 TABLET BY MOUTH DAILY. 15 tablet 5  . triamcinolone (NASACORT ALLERGY 24HR) 55 MCG/ACT AERO nasal inhaler Place 2 sprays into the nose daily. In each nostril    . Vitamin D, Ergocalciferol, (DRISDOL) 50000 units CAPS capsule TAKE 1 CAPSULE (50,000 UNITS TOTAL) BY MOUTH EVERY 7 (SEVEN) DAYS. 12 capsule 3  No facility-administered medications prior to visit.      Per HPI unless specifically indicated in ROS section below Review of Systems     Objective:    BP 124/78 (BP Location: Left Arm, Patient Position: Sitting, Cuff Size: Large)   Pulse 62   Temp 98.2 F (36.8 C) (Oral)   Ht 5\' 2"  (1.575 m)   Wt 223 lb 4 oz (101.3 kg)   SpO2 96%   BMI 40.83 kg/m   Wt Readings from Last 3 Encounters:  04/15/18 223 lb 4 oz (101.3 kg)  04/04/18 224 lb 4 oz (101.7 kg)  02/14/18 223 lb (101.2 kg)    Physical Exam  Constitutional: She appears well-developed and well-nourished. No distress.  Musculoskeletal: She exhibits edema.  Diminished pulses bilaterally.  Per patient normal arterial circulation recently L anterior ankle joint  with swelling, warmth, mild erythema, tender to touch.  No break in skin Limited ROM due to pain  Chronic venous changes and varicosities BLE  Nursing note and vitals reviewed.  Results for orders placed or performed in visit on 02/13/18  Lactate dehydrogenase  Result Value Ref Range   LDH 203 125 - 245 U/L  CMP STAT ([facility] only)  Result Value Ref Range   Sodium 145 136 - 145 mmol/L   Potassium 4.5 3.5 - 5.1 mmol/L   Chloride 108 98 - 109 mmol/L   CO2 26 22 - 29 mmol/L   Glucose, Bld 90 70 - 140 mg/dL   BUN 17 7 - 26 mg/dL   Creatinine 0.81 0.60 - 1.10 mg/dL   Calcium 10.1 8.4 - 10.4 mg/dL   Total Protein 7.7 6.4 - 8.3 g/dL   Albumin 4.1 3.5 - 5.0 g/dL   AST 14 5 - 34 U/L   ALT 22 0 - 55 U/L   Alkaline Phosphatase 57 40 - 150 U/L   Total Bilirubin 0.6 0.2 - 1.2 mg/dL   GFR, Est Non Af Am >60 >60 mL/min   GFR, Est AFR Am >60 >60 mL/min   Anion gap 11 3 - 11  CBC with Differential ([facility])  Result Value Ref Range   WBC Count 6.9 3.9 - 10.0 K/uL   RBC 4.47 3.70 - 5.32 MIL/uL   Hemoglobin 13.8 11.6 - 15.9 g/dL   HCT 42.2 34.8 - 46.6 %   MCV 94.4 81.0 - 101.0 fL   MCH 30.9 26.0 - 34.0 pg   MCHC 32.7 32.0 - 36.0 g/dL   RDW 13.0 11.1 - 15.7 %   Platelet Count 181 145 - 400 K/uL   Neutrophils Relative % 65 %   Neutro Abs 4.5 1.5 - 6.5 K/uL   Lymphocytes Relative 25 %   Lymphs Abs 1.7 0.9 - 3.3 K/uL   Monocytes Relative 7 %   Monocytes Absolute 0.5 0.1 - 0.9 K/uL   Eosinophils Relative 3 %   Eosinophils Absolute 0.2 0.0 - 0.5 K/uL   Basophils Relative 0 %   Basophils Absolute 0.0 0.0 - 0.1 K/uL      Assessment & Plan:   Problem List Items Addressed This Visit    Acute left ankle pain - Primary    Suspicious for inflammatory arthritis ?gout. Check UA, CBC, BMP. Rx OTC ibuprofen 400mg  TID with meals x 3 days (limited course), colchicine one daily PRN pain. Update with effect after 3 days. Reviewed pathophysiology of gouty arthritis.        Relevant Orders   Uric acid  Basic metabolic panel   CBC with Differential/Platelet       Meds ordered this encounter  Medications  . colchicine 0.6 MG tablet    Sig: Take 1 tablet (0.6 mg total) by mouth daily as needed (gout flare).    Dispense:  20 tablet    Refill:  0   Orders Placed This Encounter  Procedures  . Uric acid  . Basic metabolic panel  . CBC with Differential/Platelet    Follow up plan: No follow-ups on file.  [name], MD

## 2018-04-22 ENCOUNTER — Encounter: Payer: Self-pay | Admitting: Radiology

## 2018-04-22 ENCOUNTER — Encounter: Payer: Self-pay | Admitting: Family Medicine

## 2018-04-22 ENCOUNTER — Ambulatory Visit (INDEPENDENT_AMBULATORY_CARE_PROVIDER_SITE_OTHER): Payer: Medicare Other | Admitting: Family Medicine

## 2018-04-22 VITALS — BP 92/67 | HR 72 | Wt 225.1 lb

## 2018-04-22 DIAGNOSIS — Z124 Encounter for screening for malignant neoplasm of cervix: Secondary | ICD-10-CM

## 2018-04-22 DIAGNOSIS — C4361 Malignant melanoma of right upper limb, including shoulder: Secondary | ICD-10-CM | POA: Diagnosis not present

## 2018-04-22 DIAGNOSIS — Z01419 Encounter for gynecological examination (general) (routine) without abnormal findings: Secondary | ICD-10-CM

## 2018-04-22 NOTE — Progress Notes (Signed)
Subjective:     [name] is a 72 y.o. female and is here for a comprehensive physical exam. The patient reports problems - dryness. Reports vaginal dryness. On diuretics and sometimes needs Poise pads and wonders if this makes the dryness worse. Has some nipple tenderness when she does not wear a bra. Has h/o melanoma and likes her vagina to be checked.  Social History   Socioeconomic History  . Marital status: Divorced    Spouse name: Not on file  . Number of children: 0  . Years of education: Not on file  . Highest education level: Not on file  Occupational History  . Occupation: retired    Fish farm manager: RETIRED    Comment: Government social research officer   Social Needs  . Financial resource strain: Not on file  . Food insecurity:    Worry: Not on file    Inability: Not on file  . Transportation needs:    Medical: Not on file    Non-medical: Not on file  Tobacco Use  . Smoking status: Former Smoker    Types: Cigarettes    Last attempt to quit: 11/05/1965    Years since quitting: 52.4  . Smokeless tobacco: Never Used  . Tobacco comment: socially as a teen  Substance and Sexual Activity  . Alcohol use: No    Alcohol/week: 0.0 oz  . Drug use: No  . Sexual activity: Never  Lifestyle  . Physical activity:    Days per week: Not on file    Minutes per session: Not on file  . Stress: Not on file  Relationships  . Social connections:    Talks on phone: Not on file    Gets together: Not on file    Attends religious service: Not on file    Active member of club or organization: Not on file    Attends meetings of clubs or organizations: Not on file    Relationship status: Not on file  . Intimate partner violence:    Fear of current or ex partner: Not on file    Emotionally abused: Not on file    Physically abused: Not on file    Forced sexual activity: Not on file  Other Topics Concern  . Not on file  Social History Narrative   Lives alone.  Sister and husband live next door   Lives  in [place].   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   Edu: some college   Diet: poor - good water, rare fruits/vegetables   Health Maintenance  Topic Date Due  . DTaP/Tdap/Td (1 - Tdap) 11/05/2018 (Originally 07/09/1965)  . INFLUENZA VACCINE  06/05/2018  . TETANUS/TDAP  11/05/2018  . MAMMOGRAM  08/13/2019  . COLONOSCOPY  01/07/2023  . DEXA SCAN  Completed  . Hepatitis C Screening  Completed  . PNA vac Low Risk Adult  Completed    The following portions of the patient's history were reviewed and updated as appropriate: allergies, current medications, past family history, past medical history, past social history, past surgical history and problem list.  Review of Systems Pertinent items noted in HPI and remainder of comprehensive ROS otherwise negative.   Objective:    BP 92/67   Pulse 72   Wt 225 lb 1.6 oz (102.1 kg)   BMI 41.17 kg/m  General appearance: alert, cooperative, appears stated age and moderately obese Head: Normocephalic, without obvious abnormality, atraumatic Neck: supple, symmetrical, trachea midline Lungs: normal effort Breasts: normal appearance, no  masses or tenderness Heart: regular rate and rhythm Abdomen: soft, non-tender; bowel sounds normal; no masses,  no organomegaly Pelvic: cervix normal in appearance, external genitalia normal, no adnexal masses or tenderness and vaginal atrophy, nml mucosa Neurologic: Grossly normal    Assessment:    Healthy female exam.      Plan:   Problem List Items Addressed This Visit      Unprioritized   Melanoma ([place])    No evidence of any issue in the vagina       Other Visit Diagnoses    Encounter for gynecological examination without abnormal finding    -  Primary    Mammogram due 06/2018. Return in 1 year (on 04/23/2019).     See After Visit Summary for Counseling Recommendations

## 2018-04-22 NOTE — Patient Instructions (Signed)
Preventive Care 72 Years and Older, Female Preventive care refers to lifestyle choices and visits with your health care provider that can promote health and wellness. What does preventive care include?  A yearly physical exam. This is also called an annual well check.  Dental exams once or twice a year.  Routine eye exams. Ask your health care provider how often you should have your eyes checked.  Personal lifestyle choices, including: ? Daily care of your teeth and gums. ? Regular physical activity. ? Eating a healthy diet. ? Avoiding tobacco and drug use. ? Limiting alcohol use. ? Practicing safe sex. ? Taking low-dose aspirin every day. ? Taking vitamin and mineral supplements as recommended by your health care provider. What happens during an annual well check? The services and screenings done by your health care provider during your annual well check will depend on your age, overall health, lifestyle risk factors, and family history of disease. Counseling Your health care provider may ask you questions about your:  Alcohol use.  Tobacco use.  Drug use.  Emotional well-being.  Home and relationship well-being.  Sexual activity.  Eating habits.  History of falls.  Memory and ability to understand (cognition).  Work and work environment.  Reproductive health.  Screening You may have the following tests or measurements:  Height, weight, and BMI.  Blood pressure.  Lipid and cholesterol levels. These may be checked every 5 years, or more frequently if you are over 50 years old.  Skin check.  Lung cancer screening. You may have this screening every year starting at age 72 if you have a 30-pack-year history of smoking and currently smoke or have quit within the past 15 years.  Fecal occult blood test (FOBT) of the stool. You may have this test every year starting at age 72.  Flexible sigmoidoscopy or colonoscopy. You may have a sigmoidoscopy every 5 years or  a colonoscopy every 10 years starting at age 72.  Hepatitis C blood test.  Hepatitis B blood test.  Sexually transmitted disease (STD) testing.  Diabetes screening. This is done by checking your blood sugar (glucose) after you have not eaten for a while (fasting). You may have this done every 1-3 years.  Bone density scan. This is done to screen for osteoporosis. You may have this done starting at age 72.  Mammogram. This may be done every 1-2 years. Talk to your health care provider about how often you should have regular mammograms.  Talk with your health care provider about your test results, treatment options, and if necessary, the need for more tests. Vaccines Your health care provider may recommend certain vaccines, such as:  Influenza vaccine. This is recommended every year.  Tetanus, diphtheria, and acellular pertussis (Tdap, Td) vaccine. You may need a Td booster every 10 years.  Varicella vaccine. You may need this if you have not been vaccinated.  Zoster vaccine. You may need this after age 72.  Measles, mumps, and rubella (MMR) vaccine. You may need at least one dose of MMR if you were born in 1957 or later. You may also need a second dose.  Pneumococcal 13-valent conjugate (PCV13) vaccine. One dose is recommended after age 72.  Pneumococcal polysaccharide (PPSV23) vaccine. One dose is recommended after age 72.  Meningococcal vaccine. You may need this if you have certain conditions.  Hepatitis A vaccine. You may need this if you have certain conditions or if you travel or work in places where you may be exposed to hepatitis   A.  Hepatitis B vaccine. You may need this if you have certain conditions or if you travel or work in places where you may be exposed to hepatitis B.  Haemophilus influenzae type b (Hib) vaccine. You may need this if you have certain conditions.  Talk to your health care provider about which screenings and vaccines you need and how often you  need them. This information is not intended to replace advice given to you by your health care provider. Make sure you discuss any questions you have with your health care provider. Document Released: 11/18/2015 Document Revised: 07/11/2016 Document Reviewed: 08/23/2015 Elsevier Interactive Patient Education  2018 Elsevier Inc.  

## 2018-04-22 NOTE — Progress Notes (Signed)
Last mammogram  08/2017 normal

## 2018-04-22 NOTE — Progress Notes (Signed)
last mammogram-normal 2018  vaginal dryness  Left breast soreness

## 2018-04-22 NOTE — Assessment & Plan Note (Signed)
No evidence of any issue in the vagina

## 2018-04-28 ENCOUNTER — Telehealth: Payer: Self-pay | Admitting: Family Medicine

## 2018-04-28 NOTE — Telephone Encounter (Signed)
I spoke with [name]; [name] was seen 04/15/18 and [name] has been taking colchicine and advil. The 3rd day [name] had stomach pains and diarrhea. Stopped the colchicine on the 4th day. [name] was taking tylenol for pain. Now swelling at top of ankle and [name] has small reddened area on inside of lt ankle bone and goes to the center of lower leg 4 - 6". [name] has never had this redness before.No SOB or CP. [name] thinks she needs to be rechecked. I offered [name] an appt for 04/29/18 but [name] prefers to see DR G. [name] scheduled appt on 04/30/18 at 12:45. If [name] condition worsens prior to appt [name] will cb. FYI to Dr Darnell Level.

## 2018-04-28 NOTE — Telephone Encounter (Signed)
Copied from [place]. Topic: Quick Communication - See Telephone Encounter >> Apr 28, 2018  4:05 PM Burchel, Abbi R wrote: See Telephone encounter for: 04/28/18.  Pt called to speak with Dr. Synthia Innocent nurse, and wants a call back to discuss continued symptoms from gout flare-up.  Please call pt to advise.   Pt call back #: [phone]

## 2018-04-30 ENCOUNTER — Encounter: Payer: Self-pay | Admitting: Family Medicine

## 2018-04-30 ENCOUNTER — Ambulatory Visit (INDEPENDENT_AMBULATORY_CARE_PROVIDER_SITE_OTHER): Payer: Medicare Other | Admitting: Family Medicine

## 2018-04-30 VITALS — BP 122/78 | HR 66 | Temp 98.8°F | Ht 62.0 in | Wt 224.5 lb

## 2018-04-30 DIAGNOSIS — I872 Venous insufficiency (chronic) (peripheral): Secondary | ICD-10-CM | POA: Diagnosis not present

## 2018-04-30 DIAGNOSIS — M25572 Pain in left ankle and joints of left foot: Secondary | ICD-10-CM | POA: Diagnosis not present

## 2018-04-30 DIAGNOSIS — R600 Localized edema: Secondary | ICD-10-CM

## 2018-04-30 DIAGNOSIS — R21 Rash and other nonspecific skin eruption: Secondary | ICD-10-CM

## 2018-04-30 NOTE — Assessment & Plan Note (Signed)
This did improve with short NSAID and colchicine course. ?gouty arthritis although urate levels were ok (in setting of acute flare)

## 2018-04-30 NOTE — Patient Instructions (Addendum)
I'm glad ankle is doing better. Unsure where new rash is coming from, ?medicines. Let's watch for now, if spreading return for labs and see dermatologist.

## 2018-04-30 NOTE — Progress Notes (Signed)
BP 122/78 (BP Location: Left Arm, Patient Position: Sitting, Cuff Size: Large)   Pulse 66   Temp 98.8 F (37.1 C) (Oral)   Ht 5\' 2"  (1.575 m)   Wt 224 lb 8 oz (101.8 kg)   SpO2 96%   BMI 41.06 kg/m    CC: check left ankle Subjective:    Patient ID: [name], female    DOB: 09/21/1946, 72 y.o.   MRN: [id number]  HPI: [name] is a 72 y.o. female presenting on 04/30/2018 for Joint Swelling (C/o left ankle swelling with redness, rash and cracks in the skin. Has had about 2 wks. )   See prior note for details.   Seen here 2 wks ago with concern for possible L foot gout flare vs other inflammatory arthritis, treated with limited ibuprofen course, colchicine. She tolerated ibuporfen for 2 days and colchicine for 4 days. She does feel better from this (improved sensitivity, improved swelling).   2d ago noticed rash on left leg - not painful or itchy, no burning. Mainly present L anterior leg above ankle below knee.  Known chronic venous insufficiency with resultant venous stasis ulcers of BLE latest LLE s/p wound clinic eval released from their care 04/02/2018. Cetaphil helped red rash and swelling.   Relevant past medical, surgical, family and social history reviewed and updated as indicated. Interim medical history since our last visit reviewed. Allergies and medications reviewed and updated. Outpatient Medications Prior to Visit  Medication Sig Dispense Refill  . acetaminophen (TYLENOL ARTHRITIS PAIN) 650 MG CR tablet Take 1,300 mg by mouth every 8 (eight) hours as needed for pain.     Marland Kitchen albuterol (PROAIR HFA) 108 (90 Base) MCG/ACT inhaler INHALE 2 PUFFS INTO LUNGS EVERY 6 HOURS AS NEEDED FOR WHEEZING OR SHORTNESS OF BREATH 8.5 Inhaler 1  . albuterol (PROVENTIL) (2.5 MG/3ML) 0.083% nebulizer solution Take 3 mLs (2.5 mg total) by nebulization every 6 (six) hours as needed for wheezing or shortness of breath. 150 mL 1  . aspirin EC 81 MG tablet Take 81 mg by mouth at bedtime.  Melanoma prevention    . cetirizine (ZYRTEC) 10 MG tablet Take 10 mg by mouth daily.    . colchicine 0.6 MG tablet Take 1 tablet (0.6 mg total) by mouth daily as needed (gout flare). 20 tablet 0  . famotidine (PEPCID) 10 MG tablet Take 10 mg by mouth daily.    . furosemide (LASIX) 40 MG tablet TAKE 1 TABLET BY MOUTH EVERY DAY 30 tablet 5  . gabapentin (NEURONTIN) 100 MG capsule TAKE 1 CAPSULE (100 MG TOTAL) BY MOUTH AT BEDTIME. 30 capsule 3  . losartan (COZAAR) 50 MG tablet TAKE 1 TABLET EVERY DAY 30 tablet 5  . montelukast (SINGULAIR) 10 MG tablet TAKE 1 TABLET BY MOUTH DAILY 30 tablet 5  . Multiple Vitamin (MULTIVITAMIN WITH MINERALS) TABS tablet Take 1 tablet by mouth daily.    . Polyvinyl Alcohol-Povidone (TEARS PLUS OP) Place 1 drop into both eyes daily as needed (dry eyes/ redness/ burning).    . potassium chloride SA (K-DUR,KLOR-CON) 20 MEQ tablet Take 1 tablet (20 mEq total) by mouth 2 (two) times daily. 60 tablet 6  . PRESCRIPTION MEDICATION CPAP    . Probiotic Product (PROBIOTIC DAILY PO) Take 1 tablet by mouth daily.     Marland Kitchen spironolactone (ALDACTONE) 25 MG tablet TAKE 1/2 TABLET BY MOUTH DAILY. 15 tablet 5  . triamcinolone (NASACORT ALLERGY 24HR) 55 MCG/ACT AERO nasal inhaler Place 2 sprays  into the nose daily. In each nostril    . Vitamin D, Ergocalciferol, (DRISDOL) 50000 units CAPS capsule TAKE 1 CAPSULE (50,000 UNITS TOTAL) BY MOUTH EVERY 7 (SEVEN) DAYS. 12 capsule 3   No facility-administered medications prior to visit.      Per HPI unless specifically indicated in ROS section below Review of Systems     Objective:    BP 122/78 (BP Location: Left Arm, Patient Position: Sitting, Cuff Size: Large)   Pulse 66   Temp 98.8 F (37.1 C) (Oral)   Ht 5\' 2"  (1.575 m)   Wt 224 lb 8 oz (101.8 kg)   SpO2 96%   BMI 41.06 kg/m   Wt Readings from Last 3 Encounters:  04/30/18 224 lb 8 oz (101.8 kg)  04/22/18 225 lb 1.6 oz (102.1 kg)  04/15/18 223 lb 4 oz (101.3 kg)    Physical  Exam  Constitutional: She appears well-developed and well-nourished. No distress.  Musculoskeletal: She exhibits edema (chronic mild L>R tender nonpitting edema).  Skin: Skin is warm. Rash noted. No erythema.  Petechial non-blanching rash limited to anterior and lateral left lower leg  Nursing note and vitals reviewed.      Results for orders placed or performed in visit on 04/15/18  Uric acid  Result Value Ref Range   Uric Acid, Serum 5.8 2.4 - 7.0 mg/dL  Basic metabolic panel  Result Value Ref Range   Sodium 141 135 - 145 mEq/L   Potassium 4.2 3.5 - 5.1 mEq/L   Chloride 105 96 - 112 mEq/L   CO2 30 19 - 32 mEq/L   Glucose, Bld 103 (H) 70 - 99 mg/dL   BUN 19 6 - 23 mg/dL   Creatinine, Ser 0.87 0.40 - 1.20 mg/dL   Calcium 9.9 8.4 - 10.5 mg/dL   GFR 68.07 >60.00 mL/min  CBC with Differential/Platelet  Result Value Ref Range   WBC 6.7 4.0 - 10.5 K/uL   RBC 4.27 3.87 - 5.11 Mil/uL   Hemoglobin 13.1 12.0 - 15.0 g/dL   HCT 39.3 36.0 - 46.0 %   MCV 92.0 78.0 - 100.0 fl   MCHC 33.5 30.0 - 36.0 g/dL   RDW 13.2 11.5 - 15.5 %   Platelets 203.0 150.0 - 400.0 K/uL   Neutrophils Relative % 57.6 43.0 - 77.0 %   Lymphocytes Relative 30.7 12.0 - 46.0 %   Monocytes Relative 8.4 3.0 - 12.0 %   Eosinophils Relative 2.9 0.0 - 5.0 %   Basophils Relative 0.4 0.0 - 3.0 %   Neutro Abs 3.9 1.4 - 7.7 K/uL   Lymphs Abs 2.1 0.7 - 4.0 K/uL   Monocytes Absolute 0.6 0.1 - 1.0 K/uL   Eosinophils Absolute 0.2 0.0 - 0.7 K/uL   Basophils Absolute 0.0 0.0 - 0.1 K/uL      Assessment & Plan:   Problem List Items Addressed This Visit    Skin rash - Primary    Petechial skin rash present since last month, but may have slightly spread. Overall limited to left lower extremity. Photo taken today. Advised if persistent spread, to return for labwork (CBC). Did not check labs today as CBC was normal just 2 wks ago.       Pedal edema   Chronic venous insufficiency   Acute left ankle pain    This did improve  with short NSAID and colchicine course. ?gouty arthritis although urate levels were ok (in setting of acute flare)  No orders of the defined types were placed in this encounter.  No orders of the defined types were placed in this encounter.   Follow up plan: Return if symptoms worsen or fail to improve.  [name], MD

## 2018-04-30 NOTE — Assessment & Plan Note (Signed)
Petechial skin rash present since last month, but may have slightly spread. Overall limited to left lower extremity. Photo taken today. Advised if persistent spread, to return for labwork (CBC). Did not check labs today as CBC was normal just 2 wks ago.

## 2018-05-21 ENCOUNTER — Telehealth: Payer: Self-pay | Admitting: Family Medicine

## 2018-05-21 NOTE — Telephone Encounter (Signed)
Pt seen 04/30/18.

## 2018-05-21 NOTE — Telephone Encounter (Signed)
Copied from [place]. Topic: Quick Communication - See Telephone Encounter >> May 21, 2018  3:10 PM Bea Graff, NT wrote: CRM for notification. See Telephone encounter for: 05/21/18. Pt calling and states that her left leg pain is still there and that she would like to see how Dr. Danise Mina would like to proceed? CB#: [phone]

## 2018-05-22 NOTE — Telephone Encounter (Signed)
Spoke with pt relaying Dr. Synthia Innocent instructions.  Pt verbalizes understanding.  Also, pt confirms it is the same ankle pain that improved with colchicine and ibuprofen.

## 2018-05-22 NOTE — Telephone Encounter (Signed)
Is this the ankle pain that had improved with colchicine and ibuprofen? Would suggest restart colchicine daily x 3-4 days. No more NSAIDs at this time. May take tylenol for discomfort 500mg  with meals. Let us know how colchicine helps.

## 2018-05-26 ENCOUNTER — Other Ambulatory Visit: Payer: Self-pay | Admitting: Family Medicine

## 2018-05-26 NOTE — Telephone Encounter (Signed)
Pharmacy requesting refill on patient's Vitamin D 50000units, take 1 every 7 days.  LAST OV: 04/30/18 - Annual exam: 10/07/17  Last Refill: #12, 3 refills on 10/07/17.  Patient should have enough to carry her through to next annual exam 12/19.  Unsure why request refill so early.  Denied, too soon to fill.

## 2018-05-27 DIAGNOSIS — G5603 Carpal tunnel syndrome, bilateral upper limbs: Secondary | ICD-10-CM | POA: Diagnosis not present

## 2018-05-27 DIAGNOSIS — M5030 Other cervical disc degeneration, unspecified cervical region: Secondary | ICD-10-CM | POA: Diagnosis not present

## 2018-05-28 ENCOUNTER — Other Ambulatory Visit: Payer: Self-pay | Admitting: Family Medicine

## 2018-05-29 DIAGNOSIS — L820 Inflamed seborrheic keratosis: Secondary | ICD-10-CM | POA: Diagnosis not present

## 2018-05-29 DIAGNOSIS — M793 Panniculitis, unspecified: Secondary | ICD-10-CM | POA: Diagnosis not present

## 2018-05-29 DIAGNOSIS — L821 Other seborrheic keratosis: Secondary | ICD-10-CM | POA: Diagnosis not present

## 2018-05-29 DIAGNOSIS — Z85828 Personal history of other malignant neoplasm of skin: Secondary | ICD-10-CM | POA: Diagnosis not present

## 2018-05-29 DIAGNOSIS — I8311 Varicose veins of right lower extremity with inflammation: Secondary | ICD-10-CM | POA: Diagnosis not present

## 2018-05-29 DIAGNOSIS — Z85820 Personal history of malignant melanoma of skin: Secondary | ICD-10-CM | POA: Diagnosis not present

## 2018-05-29 DIAGNOSIS — I872 Venous insufficiency (chronic) (peripheral): Secondary | ICD-10-CM | POA: Diagnosis not present

## 2018-05-29 DIAGNOSIS — I8312 Varicose veins of left lower extremity with inflammation: Secondary | ICD-10-CM | POA: Diagnosis not present

## 2018-05-29 NOTE — Telephone Encounter (Signed)
Dr. Damita Dunnings, will you please address in Dr. Synthia Innocent absence?  Gabapentin Last filled:  04/29/18, #30 Last OV:  04/30/18 Next OV (CPE):  10/08/18

## 2018-05-30 NOTE — Telephone Encounter (Signed)
Sent. Thanks.   

## 2018-06-02 ENCOUNTER — Encounter: Payer: Self-pay | Admitting: Neurology

## 2018-06-02 ENCOUNTER — Telehealth: Payer: Self-pay | Admitting: Family Medicine

## 2018-06-02 NOTE — Telephone Encounter (Signed)
Copied from [place] [phone]. Topic: Quick Communication - See Telephone Encounter >> Jun 02, 2018  2:38 PM Robina Ade, Helene Kelp D wrote: CRM for notification. See Telephone encounter for: 06/02/18. Patient called and would like to talk to Dr. Danise Mina or his CMA about her gout. She said that the pain is somewhat better but the swelling is still there. Please call patient back, thanks.

## 2018-06-02 NOTE — Telephone Encounter (Signed)
Spoke with pt about gout pain.  Says she tried the colchicine 5 days and Tylenol.  Says meds are helping with pain but still has swelling.   Also, pt wants to make Dr. Darnell Level aware that she saw dermatology and was to rash on leg is vascular just as Dr. Darnell Level thought.

## 2018-06-03 ENCOUNTER — Other Ambulatory Visit: Payer: Self-pay | Admitting: *Deleted

## 2018-06-03 DIAGNOSIS — G5603 Carpal tunnel syndrome, bilateral upper limbs: Secondary | ICD-10-CM

## 2018-06-03 NOTE — Telephone Encounter (Signed)
Just recommend conservative treatment of swelling with leg elevation and avoiding sodium in diet.

## 2018-06-04 NOTE — Telephone Encounter (Signed)
Spoke with pt relaying instructions per Dr. Darnell Level.  Pt verbalizes understanding.

## 2018-06-09 ENCOUNTER — Encounter: Payer: Self-pay | Admitting: Podiatry

## 2018-06-09 ENCOUNTER — Ambulatory Visit (INDEPENDENT_AMBULATORY_CARE_PROVIDER_SITE_OTHER): Payer: Medicare Other | Admitting: Podiatry

## 2018-06-09 DIAGNOSIS — M79676 Pain in unspecified toe(s): Secondary | ICD-10-CM | POA: Diagnosis not present

## 2018-06-09 DIAGNOSIS — B351 Tinea unguium: Secondary | ICD-10-CM | POA: Diagnosis not present

## 2018-06-09 DIAGNOSIS — M25472 Effusion, left ankle: Secondary | ICD-10-CM

## 2018-06-09 DIAGNOSIS — I872 Venous insufficiency (chronic) (peripheral): Secondary | ICD-10-CM

## 2018-06-09 NOTE — Progress Notes (Signed)
Complaint:  Visit Type: Patient returns to my office for continued preventative foot care services. Complaint: Patient states" my nails have grown long and thick and become painful to walk and wear shoes"  The patient presents for preventative foot care services. No changes to ROS.  Patient says she has been treated for ulcer left leg due to her swelling .  Her ankle pain and swelling persists.  Podiatric Exam: Vascular: dorsalis pedis and posterior tibial pulses are palpable bilateral. Capillary return is immediate. Temperature gradient is WNL. Skin turgor WNL Varicosities  Feet  B/L  Swelling legs/ankle  B/L. Sensorium: Normal Semmes Weinstein monofilament test. Normal tactile sensation bilaterally. Nail Exam: Pt has thick disfigured discolored nails with subungual debris noted bilateral entire nail hallux through fifth toenails Ulcer Exam: There is no evidence of ulcer or pre-ulcerative changes or infection. Orthopedic Exam: Muscle tone and strength are WNL. No limitations in general ROM. No crepitus or effusions noted. Foot type and digits show no abnormalities. DJD  [name]  B/L Swelling ankles  B/L Skin: No Porokeratosis. No infection or ulcers.  Asymptomatic clavi.  Diagnosis:  Onychomycosis, , Pain in right toe, pain in left toes  Treatment & Plan Procedures and Treatment: Consent by patient was obtained for treatment procedures. The patient understood the discussion of treatment and procedures well. All questions were answered thoroughly reviewed. Debridement of mycotic and hypertrophic toenails, 1 through 5 bilateral and clearing of subungual debris. No ulceration, no infection noted. Patient requests minimal  dremel use left hallux.  Patient also says she had a gout attack treated by Dr.  Dionisio Paschal with colchicine.  She desires an evaluation of her left ankle.  She was referred to Dr.  Milinda Pointer for evaluation and treatment of her left ankle. Return Visit-Office Procedure: Patient  instructed to return to the office for a follow up visit 10 weeks  for continued evaluation and treatment.    Gardiner Barefoot DPM

## 2018-06-17 ENCOUNTER — Ambulatory Visit (INDEPENDENT_AMBULATORY_CARE_PROVIDER_SITE_OTHER): Payer: Medicare Other | Admitting: Neurology

## 2018-06-17 DIAGNOSIS — G5603 Carpal tunnel syndrome, bilateral upper limbs: Secondary | ICD-10-CM | POA: Diagnosis not present

## 2018-06-17 NOTE — Procedures (Signed)
[facility]  [place], [address]  [place], [place] [address] Tel: [phone] Fax:  [phone] Test Date:  06/17/2018  Patient: [name] DOB: Jul 08, 1946 Physician: Narda Amber, DO  Sex: Female Height: 5\' 2"  Ref Phys: Almedia Balls, M.D.  ID#: [id number] Temp: 33.0C Technician:    Patient Complaints: This is a 72 year-old female with history of cervical fusion referred for evaluation of bilateral hand numbness.  NCV & EMG Findings: Extensive electrodiagnostic testing of the right upper extremity and additional studies of the left shows:  1. Right median sensory response is absent. Left median sensory response shows prolonged distal peak latency (5.3 ms) and reduced amplitude (6.2 V).  Bilateral ulnar sensory responses are within normal limits. 2. Bilateral median motor responses show severely prolonged distal onset latency (L8.8, R10.4 ms) and reduced amplitude (L3.0, R2.2 mV).  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 Bilateral ulnar motor responses are within normal limits. 3. Chronic motor axon loss changes are seen in bilateral abductor pollicis brevis muscles and sparsely in the left deltoid muscle. There is no evidence of active denervation.  Impression: 1. Bilateral median neuropathy at or distal to the wrist, consistent with the clinical diagnosis of carpal tunnel syndrome. Overall, these findings are severe in degree electrically and worse on the right. 2. Incidentally, there is bilateral Martin-Gruber anastomosis, a normal variant.   ___________________________ Narda Amber, DO    Nerve Conduction Studies Anti Sensory Summary Table   Stim Site NR Peak (ms) Norm Peak (ms) P-T Amp (V) Norm P-T Amp  Left Median Anti Sensory (2nd Digit)  33C  Wrist    5.3 <3.8 6.2 >10  Right Median Anti Sensory (2nd Digit)  33C  Wrist NR  <3.8  >10    Left Ulnar Anti Sensory (5th Digit)  33C  Wrist    2.5 <3.2 23.4 >5  Right Ulnar Anti Sensory (5th Digit)  33C  Wrist    2.4 <3.2 27.8 >5   Motor Summary Table   Stim Site NR Onset (ms) Norm Onset (ms) O-P Amp (mV) Norm O-P Amp Site1 Site2 Delta-0 (ms) Dist (cm) Vel (m/s) Norm Vel (m/s)  Left Median Motor (Abd Poll Brev)  33C  Wrist    8.8 <4.0 3.0 >5 Elbow Wrist 4.1 26.0 63 >50  Elbow    12.9  3.4  Ulnar-wrist crossover Elbow 9.1 0.0    Ulnar-wrist crossover    3.8  3.2         Right Median Motor (Abd Poll Brev)  33C  Wrist    10.4 <4.0 2.2 >5 Elbow Wrist 5.3 30.0 57 >50  Elbow    15.7  1.8  Ulnar-wrist crossover Elbow 11.8 0.0    Ulnar-wrist crossover    3.9  4.6         Left Ulnar Motor (Abd Dig Minimi)  33C  Wrist    2.2 <3.1 8.5 >7 B Elbow Wrist 3.2 22.0 69 >50  B Elbow    5.4  8.5  A Elbow B Elbow 1.5 10.0 67 >50  A Elbow    6.9  8.4         Right Ulnar Motor (Abd Dig Minimi)  33C  Wrist    2.3 <3.1 9.4 >7 B Elbow Wrist 3.4 21.0 62 >50  B Elbow    5.7  9.1  A Elbow B Elbow 1.8 10.0 56 >50  A Elbow    7.5  9.1          EMG   Side Muscle Ins Act Fibs Psw Fasc Number Recrt Dur Dur. Amp Amp. Poly Poly. Comment  Left 1stDorInt Nml Nml Nml Nml Nml Nml Nml Nml Nml Nml Nml Nml N/A  Left Abd Poll Brev Nml Nml Nml Nml 2- Rapid Some 1+ Some 1+ Some 1+ N/A  Left PronatorTeres Nml Nml Nml Nml Nml Nml Nml Nml Nml Nml Nml Nml N/A  Left Biceps Nml Nml Nml Nml Nml Nml Nml Nml Nml Nml Nml Nml N/A  Left Triceps Nml Nml Nml Nml Nml Nml Nml Nml Nml Nml Nml Nml N/A  Left Deltoid Nml Nml Nml Nml 1- Rapid Few 1+ Few 1+ Nml Nml N/A  Right 1stDorInt Nml Nml Nml Nml Nml Nml Nml Nml Nml Nml Nml Nml N/A  Right Abd Poll Brev Nml Nml Nml Nml 2- Rapid Many 1+ Many 1+ Many 1+ N/A  Right PronatorTeres Nml Nml Nml Nml Nml Nml Nml Nml Nml Nml Nml Nml N/A  Right Biceps Nml Nml Nml Nml Nml Nml Nml Nml Nml Nml Nml Nml N/A  Right Triceps Nml Nml Nml Nml Nml Nml Nml Nml Nml Nml Nml Nml N/A       Waveforms:

## 2018-06-18 ENCOUNTER — Other Ambulatory Visit: Payer: Self-pay | Admitting: Family Medicine

## 2018-06-25 ENCOUNTER — Encounter: Payer: Self-pay | Admitting: Podiatry

## 2018-06-25 ENCOUNTER — Ambulatory Visit (INDEPENDENT_AMBULATORY_CARE_PROVIDER_SITE_OTHER): Payer: Medicare Other

## 2018-06-25 ENCOUNTER — Ambulatory Visit (INDEPENDENT_AMBULATORY_CARE_PROVIDER_SITE_OTHER): Payer: Medicare Other | Admitting: Podiatry

## 2018-06-25 DIAGNOSIS — M779 Enthesopathy, unspecified: Secondary | ICD-10-CM

## 2018-06-25 DIAGNOSIS — M7752 Other enthesopathy of left foot: Secondary | ICD-10-CM

## 2018-06-25 DIAGNOSIS — M7751 Other enthesopathy of right foot: Secondary | ICD-10-CM

## 2018-06-25 NOTE — Progress Notes (Signed)
She presents today to discuss gout states that her primary provider thinks that she has gout in her left ankle.  She is tried colchicine for the past 4 to 5 days made it some better but could not deal with it because of the severe GI sickness became with it.  She states that seems to be doing better now but she has had another flareup it is just slowly going away.  Objective: Vital signs are stable she is alert and oriented x3.  She has pain on palpation sinus tarsi of the left foot.  Neurologic sensorium is intact degenerative flexors are intact muscle strength is normal symmetrical bilateral.  Orthopedic evaluation and straight all joints distal ankle full range of motion without crepitation.  Cutaneous evaluation demonstrates subcutaneous fat and edema around the ankle.  Radiographs demonstrate osteoarthritis of the right ankle and dorsal of Lisfranc's joints right foot.  Some osteoarthritis of the left foot.  None of the left ankle.  Assessment: Probable gouty arthritis left foot.  Plan: Discussed etiology pathology conservative versus surgical therapies.  At this point offered her an injection to the point of maximal tenderness left foot.  She declined  Should this patient call stating that she feels like she has gout we need to make sure that she receives a log work requisition form for an arthritic profile including uric acid.

## 2018-06-25 NOTE — Patient Instructions (Signed)

## 2018-06-27 ENCOUNTER — Other Ambulatory Visit: Payer: Self-pay | Admitting: Family Medicine

## 2018-06-27 NOTE — Telephone Encounter (Signed)
Electronic refill request. Gabapentin Last office visit:   04/30/18 Last Filled:    30 capsule 0 05/30/2018  Please advise.

## 2018-06-30 DIAGNOSIS — G5603 Carpal tunnel syndrome, bilateral upper limbs: Secondary | ICD-10-CM | POA: Diagnosis not present

## 2018-06-30 DIAGNOSIS — M180 Bilateral primary osteoarthritis of first carpometacarpal joints: Secondary | ICD-10-CM | POA: Diagnosis not present

## 2018-06-30 DIAGNOSIS — M542 Cervicalgia: Secondary | ICD-10-CM | POA: Diagnosis not present

## 2018-07-01 DIAGNOSIS — D3132 Benign neoplasm of left choroid: Secondary | ICD-10-CM | POA: Diagnosis not present

## 2018-07-03 ENCOUNTER — Encounter: Payer: Self-pay | Admitting: Family Medicine

## 2018-07-03 ENCOUNTER — Ambulatory Visit (INDEPENDENT_AMBULATORY_CARE_PROVIDER_SITE_OTHER): Payer: Medicare Other | Admitting: Family Medicine

## 2018-07-03 VITALS — BP 124/70 | HR 77 | Temp 98.2°F | Ht 62.0 in | Wt 228.5 lb

## 2018-07-03 DIAGNOSIS — M25561 Pain in right knee: Secondary | ICD-10-CM | POA: Diagnosis not present

## 2018-07-03 MED ORDER — NAPROXEN SODIUM 220 MG PO TABS: 220.0000 mg | ORAL_TABLET | Freq: Every day | ORAL | Status: DC | PRN

## 2018-07-03 NOTE — Assessment & Plan Note (Addendum)
Benign exam today. Actually improving.  OA flare vs gout vs other (strain). Reviewed with patient. She did recently start spironolactone - will stop this medication and monitor effect on flares.  Pt agrees with plan.  Discussed tylenol vs aleve use.

## 2018-07-03 NOTE — Progress Notes (Signed)
BP 124/70 (BP Location: Left Arm, Patient Position: Sitting, Cuff Size: Large)   Pulse 77   Temp 98.2 F (36.8 C) (Oral)   Ht 5\' 2"  (1.575 m)   Wt 228 lb 8 oz (103.6 kg)   SpO2 99%   BMI 41.79 kg/m    CC: R knee swelling Subjective:    Patient ID: [name], female    DOB: [birth year], 72 y.o.   MRN: [id number]  HPI: [name] is a 72 y.o. female presenting on 07/03/2018 for Joint Swelling (C/o right knee swelling, pain and stiffeness. Started 06/30/18. Able to bend knee in today and only has minor pain when walking. Pt concerned about possible gout.)   3d h/o R knee and leg pain and swelling, difficulty bending knee due to stiffness. She was very uncomfortable at knee with this. Walking actually seemed to help. This morning pain and mobility has improved. Swelling is improving.   She has been using arnicare cream.   Saw podiatry - also concern for gouty arthritis of L foot.   H/o provoked DVT after L leg vein surgery (2011).   Asks about coming off spironolactone (started for pedal edema and hypokalemia). Worried this may be contributing to gout.  Has trouble tolerating colchicine - but it is effective for pain.   Relevant past medical, surgical, family and social history reviewed and updated as indicated. Interim medical history since our last visit reviewed. Allergies and medications reviewed and updated. Outpatient Medications Prior to Visit  Medication Sig Dispense Refill  . acetaminophen (TYLENOL ARTHRITIS PAIN) 650 MG CR tablet Take 1,300 mg by mouth every 8 (eight) hours as needed for pain.     Marland Kitchen albuterol (PROAIR HFA) 108 (90 Base) MCG/ACT inhaler INHALE 2 PUFFS INTO LUNGS EVERY 6 HOURS AS NEEDED FOR WHEEZING OR SHORTNESS OF BREATH 8.5 Inhaler 1  . albuterol (PROVENTIL) (2.5 MG/3ML) 0.083% nebulizer solution Take 3 mLs (2.5 mg total) by nebulization every 6 (six) hours as needed for wheezing or shortness of breath. 150 mL 1  . aspirin EC 81 MG tablet Take 81 mg  by mouth at bedtime. Melanoma prevention    . cetirizine (ZYRTEC) 10 MG tablet Take 10 mg by mouth daily.    . colchicine 0.6 MG tablet TAKE 1 TABLET (0.6 MG TOTAL) BY MOUTH DAILY AS NEEDED (GOUT FLARE). 20 tablet 0  . famotidine (PEPCID) 10 MG tablet Take 10 mg by mouth daily.    . furosemide (LASIX) 40 MG tablet TAKE 1 TABLET BY MOUTH EVERY DAY 30 tablet 5  . gabapentin (NEURONTIN) 100 MG capsule TAKE 1 CAPSULE BY MOUTH AT BEDTIME 30 capsule 6  . losartan (COZAAR) 50 MG tablet TAKE 1 TABLET EVERY DAY 30 tablet 5  . montelukast (SINGULAIR) 10 MG tablet TAKE 1 TABLET BY MOUTH DAILY 30 tablet 5  . Multiple Vitamin (MULTIVITAMIN WITH MINERALS) TABS tablet Take 1 tablet by mouth daily.    . Polyvinyl Alcohol-Povidone (TEARS PLUS OP) Place 1 drop into both eyes daily as needed (dry eyes/ redness/ burning).    . potassium chloride SA (K-DUR,KLOR-CON) 20 MEQ tablet Take 1 tablet (20 mEq total) by mouth 2 (two) times daily. 60 tablet 6  . PRESCRIPTION MEDICATION CPAP    . Probiotic Product (PROBIOTIC DAILY PO) Take 1 tablet by mouth daily.     Marland Kitchen triamcinolone (NASACORT ALLERGY 24HR) 55 MCG/ACT AERO nasal inhaler Place 2 sprays into the nose daily. In each nostril    . Vitamin D,  Ergocalciferol, (DRISDOL) 50000 units CAPS capsule TAKE 1 CAPSULE (50,000 UNITS TOTAL) BY MOUTH EVERY 7 (SEVEN) DAYS. 12 capsule 3  . spironolactone (ALDACTONE) 25 MG tablet TAKE 1/2 TABLET BY MOUTH DAILY. 15 tablet 5   No facility-administered medications prior to visit.      Per HPI unless specifically indicated in ROS section below Review of Systems     Objective:    BP 124/70 (BP Location: Left Arm, Patient Position: Sitting, Cuff Size: Large)   Pulse 77   Temp 98.2 F (36.8 C) (Oral)   Ht 5\' 2"  (1.575 m)   Wt 228 lb 8 oz (103.6 kg)   SpO2 99%   BMI 41.79 kg/m   Wt Readings from Last 3 Encounters:  07/03/18 228 lb 8 oz (103.6 kg)  04/30/18 224 lb 8 oz (101.8 kg)  04/22/18 225 lb 1.6 oz (102.1 kg)      Physical Exam  Constitutional: She appears well-developed and well-nourished. No distress.  Musculoskeletal: She exhibits edema (non pitting).  L knee WNL R knee exam: No deformity on inspection. No pain with palpation of knee landmarks. No effusion/swelling noted. FROM in flex/extension with mild crepitus. No popliteal fullness. Neg drawer test. Discomfort with mcmurray test. No pain with valgus/varus stress. No PFgrind. No abnormal patellar mobility.   Skin: No rash noted. No erythema.  Nursing note and vitals reviewed.  Lab Results  Component Value Date   LABURIC 5.8 04/15/2018    Lab Results  Component Value Date   CREATININE 0.87 04/15/2018   BUN 19 04/15/2018   NA 141 04/15/2018   K 4.2 04/15/2018   CL 105 04/15/2018   CO2 30 04/15/2018       Assessment & Plan:   Problem List Items Addressed This Visit    Right knee pain - Primary    Benign exam today. Actually improving.  OA flare vs gout vs other (strain). Reviewed with patient. She did recently start spironolactone - will stop this medication and monitor effect on flares.  Pt agrees with plan.  Discussed tylenol vs aleve use.           Meds ordered this encounter  Medications  . naproxen sodium (ALEVE) 220 MG tablet    Sig: Take 1 tablet (220 mg total) by mouth daily as needed.   No orders of the defined types were placed in this encounter.   Follow up plan: No follow-ups on file.  Amber Bush, MD

## 2018-07-03 NOTE — Patient Instructions (Addendum)
Possible gout flare, possible osteoarthritis flare, possible knee strain. Ok to come off spironolactone - and we will see if any improvement in joint pains.  Keep legs elevated.  Aleve should be ok temporarily - watch for shortness of breath and GI upset, don't take together with aspirin.

## 2018-07-10 ENCOUNTER — Ambulatory Visit: Payer: Medicare Other | Admitting: Podiatry

## 2018-07-13 ENCOUNTER — Encounter: Payer: Self-pay | Admitting: Family Medicine

## 2018-07-13 DIAGNOSIS — G5600 Carpal tunnel syndrome, unspecified upper limb: Secondary | ICD-10-CM | POA: Insufficient documentation

## 2018-07-16 ENCOUNTER — Ambulatory Visit (INDEPENDENT_AMBULATORY_CARE_PROVIDER_SITE_OTHER): Payer: Medicare Other | Admitting: Family Medicine

## 2018-07-16 ENCOUNTER — Encounter: Payer: Self-pay | Admitting: Family Medicine

## 2018-07-16 VITALS — BP 120/68 | HR 64 | Temp 98.5°F | Ht 62.0 in | Wt 231.0 lb

## 2018-07-16 DIAGNOSIS — M25561 Pain in right knee: Secondary | ICD-10-CM | POA: Diagnosis not present

## 2018-07-16 DIAGNOSIS — M25562 Pain in left knee: Secondary | ICD-10-CM | POA: Insufficient documentation

## 2018-07-16 NOTE — Assessment & Plan Note (Signed)
Anticipate pes anserine bursitis. Treat with continued aleve, arnica cream, provided with knee bursitis exercises. Update if not improving with treatment. Not consistent with gout.

## 2018-07-16 NOTE — Progress Notes (Signed)
BP 120/68 (BP Location: Left Arm, Patient Position: Sitting, Cuff Size: Large)   Pulse 64   Temp 98.5 F (36.9 C) (Oral)   Ht 5\' 2"  (1.575 m)   Wt 231 lb (104.8 kg)   SpO2 96%   BMI 42.25 kg/m    CC: L knee pain Subjective:    Patient ID: [name], female    DOB: 1946/02/21, 72 y.o.   MRN: [id number]  HPI: [name] is a 72 y.o. female presenting on 07/16/2018 for Knee Pain (C/o left knee pain when walking and swelling. Started about 1 wk ago. )   1 wk h/o L knee pain and swelling worse with walking. Some intermittent warmth, no redness. Describes anterior/lateral throbbing knee pain. Denies inciting trauma/injury. Using arnica gel. Taking aleve bid for last 3 days.   Stopped tylenol.   H/o arthroscopic surgery 1985 - for meniscal tear.   Spironolactone stopped 07/04/2018.   Relevant past medical, surgical, family and social history reviewed and updated as indicated. Interim medical history since our last visit reviewed. Allergies and medications reviewed and updated. Outpatient Medications Prior to Visit  Medication Sig Dispense Refill  . albuterol (PROAIR HFA) 108 (90 Base) MCG/ACT inhaler INHALE 2 PUFFS INTO LUNGS EVERY 6 HOURS AS NEEDED FOR WHEEZING OR SHORTNESS OF BREATH 8.5 Inhaler 1  . albuterol (PROVENTIL) (2.5 MG/3ML) 0.083% nebulizer solution Take 3 mLs (2.5 mg total) by nebulization every 6 (six) hours as needed for wheezing or shortness of breath. 150 mL 1  . cetirizine (ZYRTEC) 10 MG tablet Take 10 mg by mouth daily.    . colchicine 0.6 MG tablet TAKE 1 TABLET (0.6 MG TOTAL) BY MOUTH DAILY AS NEEDED (GOUT FLARE). 20 tablet 0  . famotidine (PEPCID) 10 MG tablet Take 10 mg by mouth daily.    . furosemide (LASIX) 40 MG tablet TAKE 1 TABLET BY MOUTH EVERY DAY 30 tablet 5  . gabapentin (NEURONTIN) 100 MG capsule TAKE 1 CAPSULE BY MOUTH AT BEDTIME 30 capsule 6  . losartan (COZAAR) 50 MG tablet TAKE 1 TABLET EVERY DAY 30 tablet 5  . montelukast (SINGULAIR) 10 MG  tablet TAKE 1 TABLET BY MOUTH DAILY 30 tablet 5  . Multiple Vitamin (MULTIVITAMIN WITH MINERALS) TABS tablet Take 1 tablet by mouth daily.    . naproxen sodium (ALEVE) 220 MG tablet Take 1 tablet (220 mg total) by mouth daily as needed. (Patient taking differently: Take 220 mg by mouth daily as needed. Takes 2 tablets daily)    . Polyvinyl Alcohol-Povidone (TEARS PLUS OP) Place 1 drop into both eyes daily as needed (dry eyes/ redness/ burning).    . potassium chloride SA (K-DUR,KLOR-CON) 20 MEQ tablet Take 1 tablet (20 mEq total) by mouth 2 (two) times daily. 60 tablet 6  . PRESCRIPTION MEDICATION CPAP    . Probiotic Product (PROBIOTIC DAILY PO) Take 1 tablet by mouth daily.     Marland Kitchen triamcinolone (NASACORT ALLERGY 24HR) 55 MCG/ACT AERO nasal inhaler Place 2 sprays into the nose daily. In each nostril    . Vitamin D, Ergocalciferol, (DRISDOL) 50000 units CAPS capsule TAKE 1 CAPSULE (50,000 UNITS TOTAL) BY MOUTH EVERY 7 (SEVEN) DAYS. 12 capsule 3  . acetaminophen (TYLENOL ARTHRITIS PAIN) 650 MG CR tablet Take 1,300 mg by mouth every 8 (eight) hours as needed for pain.     Marland Kitchen aspirin EC 81 MG tablet Take 81 mg by mouth at bedtime. Melanoma prevention     No facility-administered medications prior to  visit.      Per HPI unless specifically indicated in ROS section below Review of Systems     Objective:    BP 120/68 (BP Location: Left Arm, Patient Position: Sitting, Cuff Size: Large)   Pulse 64   Temp 98.5 F (36.9 C) (Oral)   Ht 5\' 2"  (1.575 m)   Wt 231 lb (104.8 kg)   SpO2 96%   BMI 42.25 kg/m   Wt Readings from Last 3 Encounters:  07/16/18 231 lb (104.8 kg)  07/03/18 228 lb 8 oz (103.6 kg)  04/30/18 224 lb 8 oz (101.8 kg)    Physical Exam  Constitutional: She appears well-developed and well-nourished. No distress.  Musculoskeletal: She exhibits edema (L>R).  Chronic 1+ pedal edema on left, tr on right No palpable cords FROM at bilateral knees, some crepitus on left Point tender at  L pes anserine bursa Chronic varicosities of lower legs bilaterally  Skin: Skin is warm and dry. No rash noted.  Nursing note and vitals reviewed.     Assessment & Plan:   Problem List Items Addressed This Visit    RESOLVED: Right knee pain    This has improved.       Left knee pain - Primary    Anticipate pes anserine bursitis. Treat with continued aleve, arnica cream, provided with knee bursitis exercises. Update if not improving with treatment. Not consistent with gout.          No orders of the defined types were placed in this encounter.  No orders of the defined types were placed in this encounter.   Follow up plan: Return if symptoms worsen or fail to improve.  [name], MD

## 2018-07-16 NOTE — Assessment & Plan Note (Signed)
This has improved.

## 2018-07-16 NOTE — Patient Instructions (Addendum)
I think you have left knee bursitis (pes anserine bursitis).  Treat with aleve short term, continue arnica, do exercises provided today.  You also likely have some arthritis of the knee.

## 2018-07-23 ENCOUNTER — Telehealth: Payer: Self-pay | Admitting: Family Medicine

## 2018-07-23 DIAGNOSIS — I83893 Varicose veins of bilateral lower extremities with other complications: Secondary | ICD-10-CM

## 2018-07-23 DIAGNOSIS — R600 Localized edema: Secondary | ICD-10-CM

## 2018-07-23 NOTE — Telephone Encounter (Signed)
Copied from [place] [id number]. Topic: General - Other >> Jul 23, 2018 11:12 AM Alfredia Ferguson R wrote: Pt called in and stated the pain and swelling is gone from the knee but calve is still swollen . Left calve is 9in wider than the right.  CB# [id number]

## 2018-07-23 NOTE — Telephone Encounter (Signed)
I'm glad pain and swelling of knee is improved. Anticipate calf swelling is chronic - continue treatment as up to now - leg elevation, compression stockings, etc.

## 2018-07-24 ENCOUNTER — Ambulatory Visit: Payer: Medicare Other | Admitting: Pulmonary Disease

## 2018-07-24 NOTE — Telephone Encounter (Signed)
Left message on vm for pt to call back.  Need to relay Dr. G's message.  

## 2018-07-25 NOTE — Addendum Note (Signed)
Addended by: [name] on: 07/25/2018 12:06 PM   Modules accepted: Orders

## 2018-07-25 NOTE — Telephone Encounter (Signed)
Pt. Called back - given Dr. Danise Mina message. States she would like a referral back to [facility], but does not want to see Dr. Kellie Simmering again - would like to see a different provider.She states she is concerned the left calf remains swollen - knows this is a chronic problem, but would like the referral, none the less. Please advise pt.

## 2018-07-25 NOTE — Telephone Encounter (Signed)
Sent referral over to their office to contact patient to discuss this-[name], RMA

## 2018-07-25 NOTE — Telephone Encounter (Signed)
Noted  

## 2018-07-25 NOTE — Telephone Encounter (Signed)
Referred back to [facility]. It may be up to that practice to see if they will let her see a different provider.

## 2018-07-28 ENCOUNTER — Ambulatory Visit (INDEPENDENT_AMBULATORY_CARE_PROVIDER_SITE_OTHER): Payer: Medicare Other | Admitting: Pulmonary Disease

## 2018-07-28 ENCOUNTER — Encounter: Payer: Self-pay | Admitting: Pulmonary Disease

## 2018-07-28 VITALS — BP 124/74 | HR 65 | Ht 63.0 in | Wt 230.0 lb

## 2018-07-28 DIAGNOSIS — G4733 Obstructive sleep apnea (adult) (pediatric): Secondary | ICD-10-CM

## 2018-07-28 DIAGNOSIS — J4531 Mild persistent asthma with (acute) exacerbation: Secondary | ICD-10-CM

## 2018-07-28 DIAGNOSIS — J301 Allergic rhinitis due to pollen: Secondary | ICD-10-CM | POA: Diagnosis not present

## 2018-07-28 DIAGNOSIS — Z23 Encounter for immunization: Secondary | ICD-10-CM | POA: Diagnosis not present

## 2018-07-28 NOTE — Patient Instructions (Signed)
Allergic rhinitis: Start using Flonase 2 sprays each nostril Continue Singulair Continue Zyrtec  Mild intermittent asthma: If you have to use albuterol more than twice a week during the daytime or more than twice a month at night please let me know as this would indicate poorly controlled asthma High-dose flu shot today Practice good hand hygiene Stay active  Obstructive sleep apnea: Keep using CPAP nightly  We will see you back in 6 months or sooner if needed

## 2018-07-28 NOTE — Progress Notes (Signed)
Subjective:   PATIENT ID: [name] GENDER: female DOB: 07-Dec-1945, MRN: [id number]  Synopsis: Former patient of Dr. Elsworth Soho with asthma an OSA HPI  Chief Complaint  Patient presents with  . Follow-up    Pt is doing well over all with cpap machine. Pt is having some SOB with exertion more in last 2 weeks.   [name] is worried about having an allergic symptoms to NSAIDs.  She took Aleve and had wheezing for about 5 days afterwards.  She says that she used to be able to take this, but now she can't tolerate it or [place].    She says that for the last two weeks she has been wheezing more and she has more wheezing.  She started taking flonase but she prefers to not do this.  She notes a "high wheeze".    Past Medical History:  Diagnosis Date  . Allergy   . Anesthesia complication    trouble waking up 2/2 CPAP  . Arthritis   . Asthma in adult   . Cataract    have them   . Cervical spondylosis 2012   [name], [name])  . Choroidal nevus of left eye 03/22/2016  . Chronic venous insufficiency 2016   severe reflux with painful varicosities sees [facility]  . Clotting disorder ([place])    due to vein ablation   . Complication of anesthesia    difficulty coming out due to sleep apnea per pt  . Difficult intubation    PATIENT DENIES   . Diverticulosis    per ct scans 09-2016 and 01-2017  . DVT (deep venous thrombosis) ([place]) 2011   small, developed after venous ablation  . Family history of adverse reaction to anesthesia    O2 levels drop upon waking   . Hearing loss sensory, bilateral 2013   mod-severe high freq sensorineural ([facility])  . Hiatal hernia     09-2016 ct scan [facility] at cancer center   . History of kidney stones 1980s  . History of radiation therapy 07/19/11-08/25/2011   RIGHT AXILLARY REGION/METASTATIC  . Hypertension   . Melanoma ([place]) 06/29/10   MALIGNANT MELANOMA R SHOULDER/SUPRASCAPULAR BACK s/p interferon chemo and XRT  . Neuromuscular disorder ([facility])    muscle  spasms  . OSA (obstructive sleep apnea)    on CPAP  . Pneumonia    2001  . RLS (restless legs syndrome)   . Seasonal allergies   . Sleep apnea    wears cpap  . Tinnitus   . Unspecified vitamin D deficiency 03/18/2013  . Varicose veins of left lower extremity        Review of Systems  Constitutional: Negative for malaise/fatigue and weight loss.  HENT: Positive for congestion. Negative for sinus pain and sore throat.   Respiratory: Positive for wheezing. Negative for cough and sputum production.   Cardiovascular: Negative for claudication, leg swelling and PND.  Skin: Negative for itching and rash.  Neurological: Negative for weakness.      Objective:  Physical Exam   Vitals:   07/28/18 1403  BP: 124/74  Pulse: 65  SpO2: 97%  Weight: 230 lb (104.3 kg)  Height: 5\' 3"  (1.6 m)    RA  Gen: well appearing HENT: OP clear, TM's clear, neck supple PULM: CTA B, normal percussion CV: RRR, no mgr, trace edema GI: BS+, soft, nontender Derm: no cyanosis or rash Psyche: normal mood and affect    CBC    Component Value Date/Time  WBC 6.7 04/15/2018 1429   RBC 4.27 04/15/2018 1429   HGB 13.1 04/15/2018 1429   HGB 13.8 02/13/2018 1042   HGB 13.2 08/15/2017 1039   HGB 12.8 10/21/2012   HCT 39.3 04/15/2018 1429   HCT 40.5 08/15/2017 1039   PLT 203.0 04/15/2018 1429   PLT 181 02/13/2018 1042   PLT 196 08/15/2017 1039   MCV 92.0 04/15/2018 1429   MCV 94 08/15/2017 1039   MCH 30.9 02/13/2018 1042   MCHC 33.5 04/15/2018 1429   RDW 13.2 04/15/2018 1429   RDW 13.5 08/15/2017 1039   LYMPHSABS 2.1 04/15/2018 1429   LYMPHSABS 2.0 08/15/2017 1039   MONOABS 0.6 04/15/2018 1429   EOSABS 0.2 04/15/2018 1429   EOSABS 0.2 08/15/2017 1039   BASOSABS 0.0 04/15/2018 1429   BASOSABS 0.0 08/15/2017 1039    Sleep study: PSG 2003 which showed AHI of 81 events per hour and lowest desaturation to 57%. This was corrected by CPAP of 12 cm in with residual events of 5.6 events per  hour on a study in 2007. CPAP 11/05/13 to 03/24/14>>used on 140 of 140 nights with average 4 hrs and 36 min. Average AHI is 6.5 with median CPAP 12 cm H2O.   PFT: PFTs 04/2014- no airway obs, ratio ok, FEV1 80%, TLC &DLCO nml   July 27, 2018 CPAP compliance report shows compliance with 100% of days      Assessment & Plan:   OSA (obstructive sleep apnea)  Encounter for immunization - Plan: Flu vaccine HIGH DOSE PF  Mild persistent asthma with exacerbation  Seasonal allergic rhinitis due to pollen  Discussion: 72 year old female comes to my clinic today for follow-up for asthma, allergic rhinitis and obstructive sleep apnea.  Her asthma has been stable but her allergic rhinitis is flared in the last few weeks.  She has not been taking a nasal steroid so I encouraged her to start doing this.  Plan: Allergic rhinitis: Start using Flonase 2 sprays each nostril Continue Singulair Continue Zyrtec  Mild intermittent asthma: If you have to use albuterol more than twice a week during the daytime or more than twice a month at night please let me know as this would indicate poorly controlled asthma High-dose flu shot today Practice good hand hygiene Stay active  Obstructive sleep apnea: Keep using CPAP nightly  We will see you back in 6 months or sooner if needed    Current Outpatient Medications:  .  acetaminophen (TYLENOL ARTHRITIS PAIN) 650 MG CR tablet, Take 1,300 mg by mouth every 8 (eight) hours as needed for pain. , Disp: , Rfl:  .  albuterol (PROAIR HFA) 108 (90 Base) MCG/ACT inhaler, INHALE 2 PUFFS INTO LUNGS EVERY 6 HOURS AS NEEDED FOR WHEEZING OR SHORTNESS OF BREATH, Disp: 8.5 Inhaler, Rfl: 1 .  albuterol (PROVENTIL) (2.5 MG/3ML) 0.083% nebulizer solution, Take 3 mLs (2.5 mg total) by nebulization every 6 (six) hours as needed for wheezing or shortness of breath., Disp: 150 mL, Rfl: 1 .  aspirin EC 81 MG tablet, Take 81 mg by mouth at bedtime. Melanoma prevention,  Disp: , Rfl:  .  cetirizine (ZYRTEC) 10 MG tablet, Take 10 mg by mouth daily., Disp: , Rfl:  .  colchicine 0.6 MG tablet, TAKE 1 TABLET (0.6 MG TOTAL) BY MOUTH DAILY AS NEEDED (GOUT FLARE)., Disp: 20 tablet, Rfl: 0 .  famotidine (PEPCID) 10 MG tablet, Take 10 mg by mouth daily., Disp: , Rfl:  .  furosemide (LASIX) 40 MG tablet, TAKE  1 TABLET BY MOUTH EVERY DAY, Disp: 30 tablet, Rfl: 5 .  gabapentin (NEURONTIN) 100 MG capsule, TAKE 1 CAPSULE BY MOUTH AT BEDTIME, Disp: 30 capsule, Rfl: 6 .  losartan (COZAAR) 50 MG tablet, TAKE 1 TABLET EVERY DAY, Disp: 30 tablet, Rfl: 5 .  montelukast (SINGULAIR) 10 MG tablet, TAKE 1 TABLET BY MOUTH DAILY, Disp: 30 tablet, Rfl: 5 .  Multiple Vitamin (MULTIVITAMIN WITH MINERALS) TABS tablet, Take 1 tablet by mouth daily., Disp: , Rfl:  .  naproxen sodium (ALEVE) 220 MG tablet, Take 1 tablet (220 mg total) by mouth daily as needed. (Patient taking differently: Take 220 mg by mouth daily as needed. Takes 2 tablets daily), Disp: , Rfl:  .  Polyvinyl Alcohol-Povidone (TEARS PLUS OP), Place 1 drop into both eyes daily as needed (dry eyes/ redness/ burning)., Disp: , Rfl:  .  potassium chloride SA (K-DUR,KLOR-CON) 20 MEQ tablet, Take 1 tablet (20 mEq total) by mouth 2 (two) times daily., Disp: 60 tablet, Rfl: 6 .  PRESCRIPTION MEDICATION, CPAP, Disp: , Rfl:  .  Probiotic Product (PROBIOTIC DAILY PO), Take 1 tablet by mouth daily. , Disp: , Rfl:  .  triamcinolone (NASACORT ALLERGY 24HR) 55 MCG/ACT AERO nasal inhaler, Place 2 sprays into the nose daily. In each nostril, Disp: , Rfl:  .  Vitamin D, Ergocalciferol, (DRISDOL) 50000 units CAPS capsule, TAKE 1 CAPSULE (50,000 UNITS TOTAL) BY MOUTH EVERY 7 (SEVEN) DAYS., Disp: 12 capsule, Rfl: 3

## 2018-07-29 ENCOUNTER — Other Ambulatory Visit: Payer: Self-pay | Admitting: Family Medicine

## 2018-08-01 ENCOUNTER — Ambulatory Visit (INDEPENDENT_AMBULATORY_CARE_PROVIDER_SITE_OTHER): Payer: Medicare Other | Admitting: Vascular Surgery

## 2018-08-01 ENCOUNTER — Other Ambulatory Visit: Payer: Self-pay

## 2018-08-01 ENCOUNTER — Encounter: Payer: Self-pay | Admitting: Vascular Surgery

## 2018-08-01 ENCOUNTER — Ambulatory Visit (HOSPITAL_COMMUNITY)
Admission: RE | Admit: 2018-08-01 | Discharge: 2018-08-01 | Disposition: A | Discharge status: Home / Self Care | Payer: Medicare Other | Source: Ambulatory Visit | Attending: Vascular Surgery | Admitting: Vascular Surgery

## 2018-08-01 ENCOUNTER — Other Ambulatory Visit: Payer: Self-pay | Admitting: Family Medicine

## 2018-08-01 VITALS — BP 140/79 | HR 61 | Resp 18 | Ht 63.0 in | Wt 228.6 lb

## 2018-08-01 DIAGNOSIS — I872 Venous insufficiency (chronic) (peripheral): Secondary | ICD-10-CM | POA: Diagnosis not present

## 2018-08-01 DIAGNOSIS — I83813 Varicose veins of bilateral lower extremities with pain: Secondary | ICD-10-CM

## 2018-08-01 NOTE — Progress Notes (Signed)
Patient ID: [name], female   DOB: [birth year], 72 y.o.   MRN: [id number]  Reason for Consult: New Patient (Initial Visit) (eval bil vv's w/ edema - Dr. Juanetta Beets)   Referred by [name], MD  Subjective:     HPI:  [name] is a 72 y.o. female with a history of left greater saphenous vein laser ablation.  She has had recent swelling in the left lower extremity causing pain.  States that she is also had wounds in this leg before that if healed and causing some scar tissue.  She thought she had gout later thought she had bursitis.  She has not had any fevers or chills or cellulitis and she cannot wear compression stockings she states secondary to both carpal tunnel syndrome as well as neck fusion as well as a neoprene allergy.  Past Medical History:  Diagnosis Date  . Allergy   . Anesthesia complication    trouble waking up 2/2 CPAP  . Arthritis   . Asthma in adult   . Cataract    have them   . Cervical spondylosis 2012   Jacelyn Grip, [place])  . Choroidal nevus of left eye 03/22/2016  . Chronic venous insufficiency 2016   severe reflux with painful varicosities sees [facility]  . Clotting disorder ([place])    due to vein ablation   . Complication of anesthesia    difficulty coming out due to sleep apnea per pt  . Difficult intubation    PATIENT DENIES   . Diverticulosis    per ct scans 09-2016 and 01-2017  . DVT (deep venous thrombosis) ([place]) 2011   small, developed after venous ablation  . DVT (deep venous thrombosis) ([place])   . Family history of adverse reaction to anesthesia    O2 levels drop upon waking   . Hearing loss sensory, bilateral 2013   mod-severe high freq sensorineural ([facility])  . Hiatal hernia     09-2016 ct scan [facility] at cancer center   . History of kidney stones 1980s  . History of radiation therapy 07/19/11-08/25/2011   RIGHT AXILLARY REGION/METASTATIC  . Hypertension   . Melanoma ([place]) 06/29/10   MALIGNANT MELANOMA R SHOULDER/SUPRASCAPULAR BACK  s/p interferon chemo and XRT  . Neuromuscular disorder ([facility])    muscle spasms  . OSA (obstructive sleep apnea)    on CPAP  . Pneumonia    2001  . RLS (restless legs syndrome)   . Seasonal allergies   . Sleep apnea    wears cpap  . Tinnitus   . Unspecified vitamin D deficiency 03/18/2013  . Varicose veins of left lower extremity    Family History  Problem Relation Age of Onset  . Cancer Mother 10       uterine  . CAD Father 81       MI  . Hypertension Father 56  . Alzheimer's disease Father 34  . Cancer Cousin        x2, breast  . Diabetes Sister   . Cancer Paternal Grandmother        melanoma, possibly  . Colon cancer Neg Hx   . Colon polyps Neg Hx   . Rectal cancer Neg Hx   . Stomach cancer Neg Hx    Past Surgical History:  Procedure Laterality Date  . ABI  2016   WNL  . ANTERIOR CERVICAL DECOMP/DISCECTOMY FUSION N/A 08/22/2015   ANTERIOR CERVICAL DECOMPRESSION FUSION C3/4 - interbody graft and anterior plate; Kristeen Miss, MD  .  ANTERIOR CERVICAL DECOMP/DISCECTOMY FUSION N/A 07/02/2016   Procedure: Cervical four- five Cervical five- six Cervical six- seven Anterior cervical decompression/diskectomy/fusion;  Surgeon: Kristeen Miss, MD;  Location: [facility];  Service: Neurosurgery;  Laterality: N/A;  C4-5 C5-6 C6-7 Anterior cervical decompression/diskectomy/fusion  . BREAST BIOPSY Left 2013   BENIGN  . BREAST SURGERY Right 1990   BX   . CARDIOVASCULAR STRESS TEST  2010   normal stress test, EF 66%  . COLONOSCOPY  03/2005   benign polyp, int hem, rpt 5 yrs ([place], [place])  . COLONOSCOPY WITH PROPOFOL N/A 01/06/2018   diverticulosis, decreased sphincter tone, no f/u needed [name], Kirke Corin, MD)  . DEEP AXILLARY SENTINEL NODE BIOPSY / EXCISION  2012   RIGHT  . DEXA  06/2016   WNL  . DILATION AND CURETTAGE OF UTERUS  2010  . ENDOVENOUS ABLATION SAPHENOUS VEIN W/ LASER Left 2010   [facility]  . ENDOVENOUS ABLATION SAPHENOUS VEIN W/ LASER Left 2016   accessory  branch [facility] Kellie [place])  . ESOPHAGOGASTRODUODENOSCOPY  2006   mod chronic carditis, no H pylori ([place], [place])  . [place]   left  . LUMBAR LAMINECTOMY  2001   L4/5  . MELANOMA EXCISION  2011   Right shoulder, with sentinel lymph node biopsy  . PORT-A-CATH REMOVAL  12/05/2011   Procedure: REMOVAL PORT-A-CATH;  Surgeon: Rolm Bookbinder, MD;  Location: [place];  Service: General;  Laterality: N/A;  left port removal  . PORTACATH PLACEMENT     left subclavian  . UPPER GASTROINTESTINAL ENDOSCOPY      Short Social History:  Social History   Tobacco Use  . Smoking status: Former Smoker    Types: Cigarettes    Last attempt to quit: 11/05/1965    Years since quitting: 52.7  . Smokeless tobacco: Never Used  . Tobacco comment: socially as a teen  Substance Use Topics  . Alcohol use: No    Alcohol/week: 0.0 standard drinks    Allergies  Allergen Reactions  . Sulfa Antibiotics Itching, Swelling and Shortness Of Breath    Facial swelling  . Voltaren [Diclofenac Sodium] Shortness Of Breath  . Latex Other (See Comments)    Blisters ONLY HAD REACTION TO TAPE  (??? ONLY ADHESIVE ALLERGY)  . Tape Other (See Comments)    Caused blisters - must use paper tape - same reaction from NeoPrene  . Other     Neoprene    Current Outpatient Medications  Medication Sig Dispense Refill  . acetaminophen (TYLENOL ARTHRITIS PAIN) 650 MG CR tablet Take 1,300 mg by mouth every 8 (eight) hours as needed for pain.     Marland Kitchen albuterol (PROAIR HFA) 108 (90 Base) MCG/ACT inhaler INHALE 2 PUFFS INTO LUNGS EVERY 6 HOURS AS NEEDED FOR WHEEZING OR SHORTNESS OF BREATH 8.5 Inhaler 1  . albuterol (PROVENTIL) (2.5 MG/3ML) 0.083% nebulizer solution Take 3 mLs (2.5 mg total) by nebulization every 6 (six) hours as needed for wheezing or shortness of breath. 150 mL 1  . aspirin EC 81 MG tablet Take 81 mg by mouth at bedtime. Melanoma prevention    . cetirizine (ZYRTEC) 10 MG tablet Take 10 mg  by mouth daily.    . colchicine 0.6 MG tablet TAKE 1 TABLET (0.6 MG TOTAL) BY MOUTH DAILY AS NEEDED (GOUT FLARE). 20 tablet 0  . famotidine (PEPCID) 10 MG tablet Take 10 mg by mouth daily.    . furosemide (LASIX) 40 MG tablet TAKE 1  TABLET BY MOUTH EVERY DAY 30 tablet 5  . gabapentin (NEURONTIN) 100 MG capsule TAKE 1 CAPSULE BY MOUTH AT BEDTIME 30 capsule 6  . KLOR-CON M20 20 MEQ tablet TAKE 1 TABLET BY MOUTH TWICE A DAY 60 tablet 3  . losartan (COZAAR) 50 MG tablet TAKE 1 TABLET EVERY DAY 30 tablet 5  . montelukast (SINGULAIR) 10 MG tablet TAKE 1 TABLET BY MOUTH DAILY 30 tablet 5  . Multiple Vitamin (MULTIVITAMIN WITH MINERALS) TABS tablet Take 1 tablet by mouth daily.    . naproxen sodium (ALEVE) 220 MG tablet Take 1 tablet (220 mg total) by mouth daily as needed. (Patient taking differently: Take 220 mg by mouth daily as needed. Takes 2 tablets daily)    . Polyvinyl Alcohol-Povidone (TEARS PLUS OP) Place 1 drop into both eyes daily as needed (dry eyes/ redness/ burning).    Marland Kitchen PRESCRIPTION MEDICATION CPAP    . Probiotic Product (PROBIOTIC DAILY PO) Take 1 tablet by mouth daily.     Marland Kitchen triamcinolone (NASACORT ALLERGY 24HR) 55 MCG/ACT AERO nasal inhaler Place 2 sprays into the nose daily. In each nostril    . Vitamin D, Ergocalciferol, (DRISDOL) 50000 units CAPS capsule TAKE 1 CAPSULE (50,000 UNITS TOTAL) BY MOUTH EVERY 7 (SEVEN) DAYS. 12 capsule 3   No current facility-administered medications for this visit.     Review of Systems  Constitutional:  Constitutional negative. HENT: HENT negative.  Eyes: Eyes negative.  Respiratory: Positive for wheezing.  Cardiovascular: Positive for leg swelling.  GI: Gastrointestinal negative.  Musculoskeletal: Positive for leg pain.  Skin: Skin negative.  Neurological: Neurological negative. Hematologic: Hematologic/lymphatic negative.  Psychiatric: Psychiatric negative.        Objective:  Objective   Vitals:   08/01/18 1504  BP: 140/79    Pulse: 61  Resp: 18  SpO2: 96%  Weight: 228 lb 9.6 oz (103.7 kg)  Height: 5\' 3"  (1.6 m)   Body mass index is 40.49 kg/m.  Physical Exam  Constitutional: She is oriented to person, place, and time. She appears well-developed.  HENT:  Head: Normocephalic.  Eyes: Pupils are equal, round, and reactive to light.  Neck: Normal range of motion.  Cardiovascular: Normal rate.  Pulmonary/Chest: Effort normal.  Abdominal: Soft. She exhibits no mass.  Musculoskeletal: Normal range of motion. She exhibits edema.  Neurological: She is oriented to person, place, and time.  Skin: Skin is warm. There is erythema.  Psychiatric: She has a normal mood and affect. Her behavior is normal. Judgment and thought content normal.    Data:  I have independently interpreted her lower extremity venous reflux study which demonstrates popliteal and common femoral vein reflux on the left as well as reflux in the proximal thigh greater saphenous vein bilaterally.  The left greater saphenous vein has been ablated there is an anterior saphenous branch in the proximal thigh with incompetence and a cluster of varicosities     Assessment/Plan:     72 year old female presents for left lower extremity swelling.  I am unsure of the nature of the swelling as it does appear acute may be related to an injury or rupture of a bursa sac.  She is inquiring about an MRI I do not think that this is a bad idea but I told her I do not want to order it at this time and I would let this be managed by her primary care physician which she agrees with.  From a venous standpoint she does not appear to have  a DVT there is no further intervention that can be undertaken given that the vein is already been ablated.  I am happy to see her on an as-needed basis.     Waynetta Sandy MD [facility]

## 2018-08-02 ENCOUNTER — Other Ambulatory Visit: Payer: Self-pay | Admitting: Family Medicine

## 2018-08-04 NOTE — Telephone Encounter (Signed)
Electronic refill request Last office visit 07/16/18 Last refill 06/19/18 #20

## 2018-08-05 ENCOUNTER — Telehealth: Payer: Self-pay | Admitting: Family Medicine

## 2018-08-05 DIAGNOSIS — R21 Rash and other nonspecific skin eruption: Secondary | ICD-10-CM

## 2018-08-05 DIAGNOSIS — I83893 Varicose veins of bilateral lower extremities with other complications: Secondary | ICD-10-CM

## 2018-08-05 NOTE — Telephone Encounter (Signed)
Copied from [place] [id number]. Topic: Quick Communication - See Telephone Encounter >> Aug 05, 2018 11:44 AM Rutherford Nail, NT wrote: CRM for notification. See Telephone encounter for: 08/05/18. Patient calling and states that she had seen Dr Donzetta Matters at [facility] on Friday 08/01/18 and states that he told her that he is wanting hr to get an MRI of her leg. Would like a call once this order has been placed and where she needs to go to get this done. Please advise.

## 2018-08-09 NOTE — Telephone Encounter (Signed)
I reviewed [facility] note.  Recommend OV to discuss prior to ordering MRI.

## 2018-08-11 ENCOUNTER — Telehealth: Payer: Self-pay

## 2018-08-11 NOTE — Telephone Encounter (Signed)
See TE, 08/05/18.

## 2018-08-11 NOTE — Telephone Encounter (Signed)
Spoke with [name] relaying Dr. Synthia Innocent message. [name] verbalizes understanding and is scheduled on 08/12/18 at 12:00 PM.

## 2018-08-11 NOTE — Telephone Encounter (Signed)
Copied from [place] [id number]. Topic: Quick Communication - See Telephone Encounter >> Aug 11, 2018  2:32 PM Hewitt Shorts wrote: Pt  is checking on an MRI on her leg the [facility] and vein provider was gong to recommend that dr Dani Gobble order one and the patient states it has been a week and she would like to know the status   Best number [phone]

## 2018-08-12 ENCOUNTER — Ambulatory Visit (INDEPENDENT_AMBULATORY_CARE_PROVIDER_SITE_OTHER): Payer: Medicare Other | Admitting: Family Medicine

## 2018-08-12 ENCOUNTER — Ambulatory Visit (INDEPENDENT_AMBULATORY_CARE_PROVIDER_SITE_OTHER)
Admission: RE | Admit: 2018-08-12 | Discharge: 2018-08-12 | Disposition: A | Discharge status: Home / Self Care | Payer: Medicare Other | Source: Ambulatory Visit | Attending: Family Medicine | Admitting: Family Medicine

## 2018-08-12 ENCOUNTER — Encounter: Payer: Self-pay | Admitting: Family Medicine

## 2018-08-12 VITALS — BP 124/62 | HR 61 | Temp 97.8°F | Ht 63.0 in | Wt 228.5 lb

## 2018-08-12 DIAGNOSIS — M25561 Pain in right knee: Secondary | ICD-10-CM

## 2018-08-12 DIAGNOSIS — M25562 Pain in left knee: Secondary | ICD-10-CM

## 2018-08-12 DIAGNOSIS — I83893 Varicose veins of bilateral lower extremities with other complications: Secondary | ICD-10-CM

## 2018-08-12 DIAGNOSIS — R600 Localized edema: Secondary | ICD-10-CM

## 2018-08-12 DIAGNOSIS — M1712 Unilateral primary osteoarthritis, left knee: Secondary | ICD-10-CM | POA: Diagnosis not present

## 2018-08-12 DIAGNOSIS — M79605 Pain in left leg: Secondary | ICD-10-CM

## 2018-08-12 DIAGNOSIS — G8929 Other chronic pain: Secondary | ICD-10-CM | POA: Diagnosis not present

## 2018-08-12 DIAGNOSIS — M79604 Pain in right leg: Secondary | ICD-10-CM

## 2018-08-12 DIAGNOSIS — M1711 Unilateral primary osteoarthritis, right knee: Secondary | ICD-10-CM | POA: Diagnosis not present

## 2018-08-12 NOTE — Patient Instructions (Addendum)
I think leg issues are coming from combination of venous insufficiency, lymphedema, and wear and tear knee arthritis.  Let's check knee xrays today.  Consider eval by dermatology for skin changes.

## 2018-08-12 NOTE — Assessment & Plan Note (Addendum)
L leg symptoms have improved currently, now with worsening R leg symptoms. See above. I don't think MRI will elucidate problem, didn't recommend this today. I did suggest eval by her dermatologist for noted hardening of the skin - anticipate lymphedema. ?schamberg's disease.

## 2018-08-12 NOTE — Assessment & Plan Note (Addendum)
Intermittent pain and swelling bilateral lower legs - discussed anticipated cause as combination of CVI, lymphedema and osteoarthritis. Check baseline knee films to eval for osteoarthritis burden. I think she has current OA flare of R knee with baker's cyst causing increased pain/swelling and limiting mobility.  Currently on lasix 40mg  daily and potassium 20 mEq bid.

## 2018-08-12 NOTE — Progress Notes (Signed)
BP 124/62 (BP Location: Left Arm, Patient Position: Sitting, Cuff Size: Large)   Pulse 61   Temp 97.8 F (36.6 C) (Oral)   Ht 5\' 3"  (1.6 m)   Wt 228 lb 8 oz (103.6 kg)   SpO2 97%   BMI 40.48 kg/m    CC: discuss leg pain/swelling Subjective:    Patient ID: [name], female    DOB: 23-Apr-1946, 72 y.o.   MRN: [id number]  HPI: [name] is a 72 y.o. female presenting on 08/12/2018 for Results (Here to discuss [facility] note and having MRI.) and Knee Pain (C/o right knee pain. Not able to bend knee and is stiff. Started about 3 days ago. )   Recent [facility] evaluation for chronic L leg swelling, pain. Note reviewed. Lower extremity venous reflux study showed popliteal and common femoral vein reflux on L as well as proximal thigh greater saphenous vein reflux bilaterally. S/p remote L greater saphenous vein ablation - no further venous intervention options. MRI was discussed, [facility] wanted PCP to manage. She has seen lymphedema specialist who did think she had leg lymphedema. Pt was unable to wear compression wraps. She did not follow up.   Chronic L pedal edema. Today L leg feels better, but now worsening R leg pain and swelling noted especially at R knee.   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 Regularly sees onc, no signs of recurrence.   Latest CT abd/pelvis 01/2017: IMPRESSION: 1. Asymmetric dermal thickening in the right breast appears similar to prior study from 4 months ago, presumably chronic inflammation. Correlate clinically. Mammographic correlation recommended if not recently performed. 2. No pathologically enlarged lymph nodes are seen. Small inguinal lymph nodes demonstrate normal morphology, likely  reactive. 3. Stable additional incidental findings, including hepatic and renal cysts and atherosclerosis. Electronically Signed   By: Richardean Sale M.D.   On: 01/09/2017 15:24  Known chronic venous insufficiency with resultant venous stasis ulcers of BLE latest LLE s/p wound clinic eval released from their care 04/02/2018. Cetaphil helped red rash and swelling.   Relevant past medical, surgical, family and social history reviewed and updated as indicated. Interim medical history since our last visit reviewed. Allergies and medications reviewed and updated. Outpatient Medications Prior to Visit  Medication Sig Dispense Refill  . acetaminophen (TYLENOL ARTHRITIS PAIN) 650 MG CR tablet Take 1,300 mg by mouth every 8 (eight) hours as needed for pain.     Marland Kitchen albuterol (PROAIR HFA) 108 (90 Base) MCG/ACT inhaler INHALE 2 PUFFS INTO LUNGS EVERY 6 HOURS AS NEEDED FOR WHEEZING OR SHORTNESS OF BREATH 8.5 Inhaler 1  . albuterol (PROVENTIL) (2.5 MG/3ML) 0.083% nebulizer solution Take 3 mLs (2.5 mg total) by nebulization every 6 (six) hours as needed for wheezing or shortness of breath. 150 mL 1  . aspirin EC 81 MG tablet Take 81 mg by mouth at bedtime. Melanoma prevention    . cetirizine (ZYRTEC) 10 MG tablet Take 10 mg by mouth daily.    . colchicine 0.6 MG tablet TAKE 1 TABLET (0.6 MG TOTAL) BY MOUTH DAILY AS NEEDED (GOUT FLARE). 20 tablet 0  . famotidine (PEPCID) 10 MG tablet Take 10 mg by mouth daily.    . furosemide (LASIX) 40 MG tablet  TAKE 1 TABLET BY MOUTH EVERY DAY 30 tablet 5  . gabapentin (NEURONTIN) 100 MG capsule TAKE 1 CAPSULE BY MOUTH AT BEDTIME 30 capsule 6  . KLOR-CON M20 20 MEQ tablet TAKE 1 TABLET BY MOUTH TWICE A DAY 60 tablet 3  . losartan (COZAAR) 50 MG tablet TAKE 1 TABLET EVERY DAY 30 tablet 5  . montelukast (SINGULAIR) 10 MG tablet TAKE 1 TABLET BY MOUTH DAILY 30 tablet 5  . Multiple Vitamin (MULTIVITAMIN WITH MINERALS) TABS tablet Take 1 tablet by mouth daily.    . naproxen  sodium (ALEVE) 220 MG tablet Take 1 tablet (220 mg total) by mouth daily as needed. (Patient taking differently: Take 220 mg by mouth daily as needed. Takes 2 tablets daily)    . Polyvinyl Alcohol-Povidone (TEARS PLUS OP) Place 1 drop into both eyes daily as needed (dry eyes/ redness/ burning).    Marland Kitchen PRESCRIPTION MEDICATION CPAP    . Probiotic Product (PROBIOTIC DAILY PO) Take 1 tablet by mouth daily.     Marland Kitchen triamcinolone (NASACORT ALLERGY 24HR) 55 MCG/ACT AERO nasal inhaler Place 2 sprays into the nose daily. In each nostril    . Vitamin D, Ergocalciferol, (DRISDOL) 50000 units CAPS capsule TAKE 1 CAPSULE (50,000 UNITS TOTAL) BY MOUTH EVERY 7 (SEVEN) DAYS. 12 capsule 3   No facility-administered medications prior to visit.      Per HPI unless specifically indicated in ROS section below Review of Systems     Objective:    BP 124/62 (BP Location: Left Arm, Patient Position: Sitting, Cuff Size: Large)   Pulse 61   Temp 97.8 F (36.6 C) (Oral)   Ht 5\' 3"  (1.6 m)   Wt 228 lb 8 oz (103.6 kg)   SpO2 97%   BMI 40.48 kg/m   Wt Readings from Last 3 Encounters:  08/12/18 228 lb 8 oz (103.6 kg)  08/01/18 228 lb 9.6 oz (103.7 kg)  07/28/18 230 lb (104.3 kg)    Physical Exam  Constitutional: She appears well-developed and well-nourished. No distress.  HENT:  Mouth/Throat: Oropharynx is clear and moist. No oropharyngeal exudate.  Musculoskeletal: Normal range of motion. She exhibits edema (chronic, nonpitting).  Bilat calf circ 43cm Generalized tenderness to palpation BLE No erythema or warmth noted Limited flexion of R knee due to pain/swelling Popliteal fullness at R leg 1+ DP bilaterally  Skin: Skin is warm and dry. No rash noted. No erythema.  Psychiatric: She has a normal mood and affect.  Nursing note and vitals reviewed.  Results for orders placed or performed in visit on 04/15/18  Uric acid  Result Value Ref Range   Uric Acid, Serum 5.8 2.4 - 7.0 mg/dL  Basic metabolic panel   Result Value Ref Range   Sodium 141 135 - 145 mEq/L   Potassium 4.2 3.5 - 5.1 mEq/L   Chloride 105 96 - 112 mEq/L   CO2 30 19 - 32 mEq/L   Glucose, Bld 103 (H) 70 - 99 mg/dL   BUN 19 6 - 23 mg/dL   Creatinine, Ser 0.87 0.40 - 1.20 mg/dL   Calcium 9.9 8.4 - 10.5 mg/dL   GFR 68.07 >60.00 mL/min  CBC with Differential/Platelet  Result Value Ref Range   WBC 6.7 4.0 - 10.5 K/uL   RBC 4.27 3.87 - 5.11 Mil/uL   Hemoglobin 13.1 12.0 - 15.0 g/dL   HCT 39.3 36.0 - 46.0 %   MCV 92.0 78.0 - 100.0 fl   MCHC 33.5 30.0 - 36.0 g/dL  RDW 13.2 11.5 - 15.5 %   Platelets 203.0 150.0 - 400.0 K/uL   Neutrophils Relative % 57.6 43.0 - 77.0 %   Lymphocytes Relative 30.7 12.0 - 46.0 %   Monocytes Relative 8.4 3.0 - 12.0 %   Eosinophils Relative 2.9 0.0 - 5.0 %   Basophils Relative 0.4 0.0 - 3.0 %   Neutro Abs 3.9 1.4 - 7.7 K/uL   Lymphs Abs 2.1 0.7 - 4.0 K/uL   Monocytes Absolute 0.6 0.1 - 1.0 K/uL   Eosinophils Absolute 0.2 0.0 - 0.7 K/uL   Basophils Absolute 0.0 0.0 - 0.1 K/uL      Assessment & Plan:   Problem List Items Addressed This Visit    Varicose veins of both lower extremities with complications   Leg pain, bilateral    L leg symptoms have improved currently, now with worsening R leg symptoms. See above. I don't think MRI will elucidate problem, didn't recommend this today. I did suggest eval by her dermatologist for noted hardening of the skin - anticipate lymphedema. ?schamberg's disease.       Bilateral lower extremity edema - Primary    Intermittent pain and swelling bilateral lower legs - discussed anticipated cause as combination of CVI, lymphedema and osteoarthritis. Check baseline knee films to eval for osteoarthritis burden. I think she has current OA flare of R knee with baker's cyst causing increased pain/swelling and limiting mobility.  Currently on lasix 40mg  daily and potassium 20 mEq bid.        Other Visit Diagnoses    Chronic pain of both knees       Relevant  Orders   DG Knee Complete 4 Views Right   DG Knee Complete 4 Views Left       No orders of the defined types were placed in this encounter.  Orders Placed This Encounter  Procedures  . DG Knee Complete 4 Views Right    Standing Status:   Future    Number of Occurrences:   1    Standing Expiration Date:   10/13/2019    Order Specific Question:   Reason for Exam (SYMPTOM  OR DIAGNOSIS REQUIRED)    Answer:   eval knee arthritis, knee pain    Order Specific Question:   Preferred imaging location?    Answer:   [facility]    Order Specific Question:   Radiology Contrast Protocol - do NOT remove file path    Answer:   [email or web address]  . DG Knee Complete 4 Views Left    Standing Status:   Future    Number of Occurrences:   1    Standing Expiration Date:   10/13/2019    Order Specific Question:   Reason for Exam (SYMPTOM  OR DIAGNOSIS REQUIRED)    Answer:   eval knee arthritis, knee pain    Order Specific Question:   Preferred imaging location?    Answer:   [facility]    Order Specific Question:   Radiology Contrast Protocol - do NOT remove file path    Answer:   [email or web address]    Follow up plan: No follow-ups on file.  [name], MD

## 2018-08-14 ENCOUNTER — Inpatient Hospital Stay: Payer: Medicare Other | Attending: Hematology & Oncology

## 2018-08-14 ENCOUNTER — Inpatient Hospital Stay (HOSPITAL_BASED_OUTPATIENT_CLINIC_OR_DEPARTMENT_OTHER): Payer: Medicare Other | Admitting: Hematology & Oncology

## 2018-08-14 ENCOUNTER — Other Ambulatory Visit: Payer: Self-pay

## 2018-08-14 ENCOUNTER — Encounter: Payer: Self-pay | Admitting: Hematology & Oncology

## 2018-08-14 VITALS — BP 141/73 | HR 65 | Temp 97.5°F | Resp 19 | Wt 227.0 lb

## 2018-08-14 DIAGNOSIS — Z85820 Personal history of malignant melanoma of skin: Secondary | ICD-10-CM

## 2018-08-14 DIAGNOSIS — Z79899 Other long term (current) drug therapy: Secondary | ICD-10-CM | POA: Diagnosis not present

## 2018-08-14 DIAGNOSIS — M129 Arthropathy, unspecified: Secondary | ICD-10-CM | POA: Diagnosis not present

## 2018-08-14 DIAGNOSIS — Z7982 Long term (current) use of aspirin: Secondary | ICD-10-CM | POA: Insufficient documentation

## 2018-08-14 DIAGNOSIS — C4361 Malignant melanoma of right upper limb, including shoulder: Secondary | ICD-10-CM

## 2018-08-14 DIAGNOSIS — M7989 Other specified soft tissue disorders: Secondary | ICD-10-CM

## 2018-08-14 LAB — CMP (CANCER CENTER ONLY)
ALT: 22 U/L (ref 0–44)
AST: 15 U/L (ref 15–41)
Albumin: 4.1 g/dL (ref 3.5–5.0)
Alkaline Phosphatase: 60 U/L (ref 38–126)
Anion gap: 11 (ref 5–15)
BUN: 12 mg/dL (ref 8–23)
CO2: 27 mmol/L (ref 22–32)
Calcium: 10 mg/dL (ref 8.9–10.3)
Chloride: 107 mmol/L (ref 98–111)
Creatinine: 0.77 mg/dL (ref 0.44–1.00)
GFR, Est AFR Am: >60 mL/min (ref >60)
GFR, Estimated: >60 mL/min (ref >60)
Glucose, Bld: 101 mg/dL — ABNORMAL HIGH (ref 70–99)
Potassium: 4.1 mmol/L (ref 3.5–5.1)
Sodium: 145 mmol/L (ref 135–145)
Total Bilirubin: 0.6 mg/dL (ref 0.3–1.2)
Total Protein: 7.6 g/dL (ref 6.5–8.1)

## 2018-08-14 LAB — LACTATE DEHYDROGENASE: LDH: 207 U/L — ABNORMAL HIGH (ref 98–192)

## 2018-08-14 LAB — CBC WITH DIFFERENTIAL (CANCER CENTER ONLY)
Abs Immature Granulocytes: 0.02 10*3/uL (ref 0.00–0.07)
Basophils Absolute: 0 10*3/uL (ref 0.0–0.1)
Basophils Relative: 0 %
Eosinophils Absolute: 0.3 10*3/uL (ref 0.0–0.5)
Eosinophils Relative: 5 %
HCT: 42.1 % (ref 36.0–46.0)
Hemoglobin: 13 g/dL (ref 12.0–15.0)
Immature Granulocytes: 0 %
Lymphocytes Relative: 29 %
Lymphs Abs: 1.8 10*3/uL (ref 0.7–4.0)
MCH: 29.7 pg (ref 26.0–34.0)
MCHC: 30.9 g/dL (ref 30.0–36.0)
MCV: 96.1 fL (ref 80.0–100.0)
Monocytes Absolute: 0.4 10*3/uL (ref 0.1–1.0)
Monocytes Relative: 7 %
Neutro Abs: 3.6 10*3/uL (ref 1.7–7.7)
Neutrophils Relative %: 59 %
Platelet Count: 197 10*3/uL (ref 150–400)
RBC: 4.38 MIL/uL (ref 3.87–5.11)
RDW: 12.4 % (ref 11.5–15.5)
WBC Count: 6.1 10*3/uL (ref 4.0–10.5)
nRBC: 0 % (ref 0.0–0.2)

## 2018-08-14 NOTE — Progress Notes (Signed)
Hematology and Oncology Follow Up Visit  [name] [id number] 1946/08/30 72 y.o. 08/14/2018   Principle Diagnosis:  Stage IIIC (T3b N3 M0) melanoma of the right shoulder  Current Therapy:   Observation   Interim History:  [name] is here today for follow-up.  She is still being bothered by leg swelling.  She has seen multiple doctors for this.  She is going back to see her family doctor.  It sounds like she may get an MRI.  She is also having problems with arthritis.  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  I do not know if this is gout.  It sounds like she is been treated for gout.  As far as melanoma is concerned, she is done really well with this.  There is been no problems with respect to recurrent melanoma.  She has been off treatment now for almost 7 years.  I must say, that I would think that the risk of recurrence would be quite low at this point.  She is had no cough or shortness of breath.  She is had no nausea or vomiting.  There is been no change in bowel or bladder habits.  Overall, her performance status is ECOG 1.  1 Medications:  Allergies as of 08/14/2018      Reactions   Sulfa Antibiotics Itching, Swelling, Shortness Of Breath   Facial swelling   Voltaren [diclofenac Sodium] Shortness Of Breath   Latex Other (See Comments)   Blisters ONLY HAD REACTION TO TAPE  (??? ONLY ADHESIVE ALLERGY)   Tape Other (See Comments)   Caused blisters - must use paper tape - same reaction from NeoPrene   Other    Neoprene      Medication List        Accurate as of 08/14/18 12:02 PM. Always use your most recent med list.          albuterol (2.5 MG/3ML) 0.083% nebulizer solution Commonly known as:  PROVENTIL Take 3 mLs (2.5 mg total) by nebulization every 6 (six) hours as needed for wheezing or shortness of breath.   albuterol 108 (90 Base) MCG/ACT inhaler Commonly known as:  PROVENTIL HFA;VENTOLIN HFA INHALE 2 PUFFS INTO LUNGS EVERY 6 HOURS AS  NEEDED FOR WHEEZING OR SHORTNESS OF BREATH   aspirin EC 81 MG tablet Take 81 mg by mouth at bedtime. Melanoma prevention   cetirizine 10 MG tablet Commonly known as:  ZYRTEC Take 10 mg by mouth daily.   colchicine 0.6 MG tablet TAKE 1 TABLET (0.6 MG TOTAL) BY MOUTH DAILY AS NEEDED (GOUT FLARE).   famotidine 10 MG tablet Commonly known as:  PEPCID Take 10 mg by mouth daily.   furosemide 40 MG tablet Commonly known as:  LASIX TAKE 1 TABLET BY MOUTH EVERY DAY   gabapentin 100 MG capsule Commonly known as:  NEURONTIN TAKE 1 CAPSULE BY MOUTH AT BEDTIME   KLOR-CON M20 20 MEQ tablet Generic drug:  potassium chloride SA TAKE 1 TABLET BY MOUTH TWICE A DAY   losartan 50 MG tablet Commonly known as:  COZAAR TAKE 1 TABLET EVERY DAY   montelukast 10 MG tablet Commonly known as:  SINGULAIR TAKE 1 TABLET BY MOUTH DAILY   multivitamin with minerals Tabs tablet Take 1 tablet by mouth daily.   naproxen sodium 220 MG tablet Commonly known as:  ALEVE Take 1 tablet (220 mg total) by mouth daily as needed.   NASACORT ALLERGY 24HR 55 MCG/ACT Aero nasal inhaler Generic drug:  triamcinolone Place 2 sprays into the nose daily. In each nostril   PRESCRIPTION MEDICATION CPAP   PROBIOTIC DAILY PO Take 1 tablet by mouth daily.   TEARS PLUS OP Place 1 drop into both eyes daily as needed (dry eyes/ redness/ burning).   TYLENOL ARTHRITIS PAIN 650 MG CR tablet Generic drug:  acetaminophen Take 1,300 mg by mouth every 8 (eight) hours as needed for pain.   Vitamin D (Ergocalciferol) 50000 units Caps capsule Commonly known as:  DRISDOL TAKE 1 CAPSULE (50,000 UNITS TOTAL) BY MOUTH EVERY 7 (SEVEN) DAYS.       Allergies:  Allergies  Allergen Reactions  . Sulfa Antibiotics Itching, Swelling and Shortness Of Breath    Facial swelling  . Voltaren [Diclofenac Sodium] Shortness Of Breath  . Latex Other (See Comments)    Blisters ONLY HAD REACTION TO TAPE  (??? ONLY ADHESIVE ALLERGY)  .  Tape Other (See Comments)    Caused blisters - must use paper tape - same reaction from NeoPrene  . Other     Neoprene    Past Medical History, Surgical history, Social history, and Family History were reviewed and updated.  Review of Systems: Review of Systems  Constitutional: Negative.   HENT: Negative.   Eyes: Negative.   Respiratory: Negative.   Cardiovascular: Positive for leg swelling.  Gastrointestinal: Negative.   Genitourinary: Negative.   Musculoskeletal: Positive for back pain and joint pain.  Skin: Negative.   Neurological: Negative.   Endo/Heme/Allergies: Negative.   Psychiatric/Behavioral: Negative.       Physical Exam:  weight is 227 lb (103 kg). Her oral temperature is 97.5 F (36.4 C) (abnormal). Her blood pressure is 141/73 (abnormal) and her pulse is 65. Her respiration is 19 and oxygen saturation is 99%.   Wt Readings from Last 3 Encounters:  08/14/18 227 lb (103 kg)  08/12/18 228 lb 8 oz (103.6 kg)  08/01/18 228 lb 9.6 oz (103.7 kg)    Physical Exam  Constitutional: She is oriented to person, place, and time.  HENT:  Head: Normocephalic and atraumatic.  Mouth/Throat: Oropharynx is clear and moist.  Eyes: Pupils are equal, round, and reactive to light. EOM are normal.  Neck: Normal range of motion.  Cardiovascular: Normal rate, regular rhythm and normal heart sounds.  Pulmonary/Chest: Effort normal and breath sounds normal.  Abdominal: Soft. Bowel sounds are normal.  Musculoskeletal: Normal range of motion. She exhibits no edema, tenderness or deformity.  She has bilateral lower extremity edema.  This seems to be mostly in the calf area.  I do not palpate any obvious venous cord.  She has no obvious popliteal cyst.  Lymphadenopathy:    She has no cervical adenopathy.  Neurological: She is alert and oriented to person, place, and time.  Skin: Skin is warm and dry. No rash noted. No erythema.  Psychiatric: She has a normal mood and affect. Her  behavior is normal. Judgment and thought content normal.  Vitals reviewed.    Lab Results  Component Value Date   WBC 6.1 08/14/2018   HGB 13.0 08/14/2018   HCT 42.1 08/14/2018   MCV 96.1 08/14/2018   PLT 197 08/14/2018   Lab Results  Component Value Date   FERRITIN 105 08/21/2013   IRON 73 08/21/2013   TIBC 280 08/21/2013   UIBC 207 08/21/2013   IRONPCTSAT 26 08/21/2013   Lab Results  Component Value Date   RBC 4.38 08/14/2018   No results found for: KPAFRELGTCHN, LAMBDASER, KAPLAMBRATIO No results  found for: Kandis Cocking, IGMSERUM No results found for: Odetta Pink, SPEI   Chemistry      Component Value Date/Time   NA 141 04/15/2018 1429   NA 149 (H) 08/15/2017 1039   NA 143 09/17/2016 1018   K 4.2 04/15/2018 1429   K 4.1 08/15/2017 1039   K 3.5 09/17/2016 1018   CL 105 04/15/2018 1429   CL 107 08/15/2017 1039   CO2 30 04/15/2018 1429   CO2 29 08/15/2017 1039   CO2 28 09/17/2016 1018   BUN 19 04/15/2018 1429   BUN 14 08/15/2017 1039   BUN 19.5 09/17/2016 1018   CREATININE 0.87 04/15/2018 1429   CREATININE 0.81 02/13/2018 1042   CREATININE 1.0 08/15/2017 1039   CREATININE 0.8 09/17/2016 1018      Component Value Date/Time   CALCIUM 9.9 04/15/2018 1429   CALCIUM 9.7 08/15/2017 1039   CALCIUM 9.8 09/17/2016 1018   ALKPHOS 57 02/13/2018 1042   ALKPHOS 48 08/15/2017 1039   ALKPHOS 71 09/17/2016 1018   AST 14 02/13/2018 1042   AST 10 09/17/2016 1018   ALT 22 02/13/2018 1042   ALT 43 08/15/2017 1039   ALT 18 09/17/2016 1018   BILITOT 0.6 02/13/2018 1042   BILITOT 0.39 09/17/2016 1018      Impression and Plan: [name] is a very pleasant 71 yo caucasian female with history of stage IIIc melanoma if the right shoulder with resection. She had 8 positive lymph nodes and completed adjuvant interferon therapy in [place] 2012 followed by radiation to the right axilla.   Again, I do not see any problem  with respect to melanoma.  I am just happy that this is not an issue for her.  Hopefully, her doctors will be able to sort out her legs.  I know that she likes being active and that this is really bothering her which she cannot be active.  I will plan to get her back now in 6 months.  I think this is reasonable.  She can always come back sooner if she has any problems.     [name], MD 10/10/201912:02 PM

## 2018-08-20 ENCOUNTER — Encounter: Payer: Self-pay | Admitting: Family Medicine

## 2018-08-20 DIAGNOSIS — M1189 Other specified crystal arthropathies, multiple sites: Secondary | ICD-10-CM | POA: Insufficient documentation

## 2018-08-25 ENCOUNTER — Other Ambulatory Visit: Payer: Self-pay | Admitting: Family Medicine

## 2018-08-25 DIAGNOSIS — M1189 Other specified crystal arthropathies, multiple sites: Secondary | ICD-10-CM

## 2018-08-25 MED ORDER — TRAMADOL HCL 50 MG PO TABS: 25.0000 mg | ORAL_TABLET | Freq: Three times a day (TID) | ORAL | 0 refills | Status: DC | PRN

## 2018-08-26 ENCOUNTER — Other Ambulatory Visit: Payer: Self-pay | Admitting: Family Medicine

## 2018-08-28 ENCOUNTER — Ambulatory Visit (INDEPENDENT_AMBULATORY_CARE_PROVIDER_SITE_OTHER): Payer: Medicare Other | Admitting: Podiatry

## 2018-08-28 ENCOUNTER — Encounter: Payer: Self-pay | Admitting: Podiatry

## 2018-08-28 DIAGNOSIS — B351 Tinea unguium: Secondary | ICD-10-CM | POA: Diagnosis not present

## 2018-08-28 DIAGNOSIS — M79676 Pain in unspecified toe(s): Secondary | ICD-10-CM | POA: Diagnosis not present

## 2018-08-28 NOTE — Progress Notes (Signed)
Complaint:  Visit Type: Patient returns to my office for continued preventative foot care services. Complaint: Patient states" my nails have grown long and thick and become painful to walk and wear shoes"  The patient presents for preventative foot care services. No changes to ROS.  Patient says she has been treated for ulcer left leg due to her swelling .  Her ankle pain and swelling persists.  Podiatric Exam: Vascular: dorsalis pedis and posterior tibial pulses are palpable bilateral. Capillary return is immediate. Temperature gradient is WNL. Skin turgor WNL Varicosities  Feet  B/L  Swelling legs/ankle  B/L. Sensorium: Normal Semmes Weinstein monofilament test. Normal tactile sensation bilaterally. Nail Exam: Pt has thick disfigured discolored nails with subungual debris noted bilateral entire nail hallux through fifth toenails Ulcer Exam: There is no evidence of ulcer or pre-ulcerative changes or infection. Orthopedic Exam: Muscle tone and strength are WNL. No limitations in general ROM. No crepitus or effusions noted. Foot type and digits show no abnormalities. DJD  [name]  B/L Swelling ankles  B/L Skin: No Porokeratosis. No infection or ulcers.  Asymptomatic clavi.  Diagnosis:  Onychomycosis, , Pain in right toe, pain in left toes  Treatment & Plan Procedures and Treatment: Consent by patient was obtained for treatment procedures. The patient understood the discussion of treatment and procedures well. All questions were answered thoroughly reviewed. Debridement of mycotic and hypertrophic toenails, 1 through 5 bilateral and clearing of subungual debris. No ulceration, no infection noted. Patient requests minimal  dremel use left hallux.  Patient also says she had a gout attack treated by Dr.  Dionisio Paschal with colchicine.  She desires an evaluation of her left ankle.  She was referred to Dr.  Milinda Pointer for evaluation and treatment of her left ankle. Return Visit-Office Procedure: Patient  instructed to return to the office for a follow up visit 10 weeks  for continued evaluation and treatment.    Gardiner Barefoot DPM

## 2018-08-29 ENCOUNTER — Other Ambulatory Visit: Payer: Self-pay | Admitting: Family Medicine

## 2018-09-03 DIAGNOSIS — M1990 Unspecified osteoarthritis, unspecified site: Secondary | ICD-10-CM | POA: Diagnosis not present

## 2018-09-03 DIAGNOSIS — M109 Gout, unspecified: Secondary | ICD-10-CM | POA: Diagnosis not present

## 2018-09-03 DIAGNOSIS — M25473 Effusion, unspecified ankle: Secondary | ICD-10-CM | POA: Diagnosis not present

## 2018-09-03 DIAGNOSIS — M25461 Effusion, right knee: Secondary | ICD-10-CM | POA: Diagnosis not present

## 2018-09-03 DIAGNOSIS — M1120 Other chondrocalcinosis, unspecified site: Secondary | ICD-10-CM | POA: Diagnosis not present

## 2018-09-03 DIAGNOSIS — M25569 Pain in unspecified knee: Secondary | ICD-10-CM | POA: Diagnosis not present

## 2018-09-03 DIAGNOSIS — R768 Other specified abnormal immunological findings in serum: Secondary | ICD-10-CM | POA: Diagnosis not present

## 2018-09-08 DIAGNOSIS — M11261 Other chondrocalcinosis, right knee: Secondary | ICD-10-CM | POA: Diagnosis not present

## 2018-09-17 ENCOUNTER — Ambulatory Visit (INDEPENDENT_AMBULATORY_CARE_PROVIDER_SITE_OTHER): Payer: Medicare Other | Admitting: Orthotics

## 2018-09-17 DIAGNOSIS — M7752 Other enthesopathy of left foot: Secondary | ICD-10-CM

## 2018-09-17 DIAGNOSIS — M7751 Other enthesopathy of right foot: Secondary | ICD-10-CM

## 2018-09-17 DIAGNOSIS — B351 Tinea unguium: Secondary | ICD-10-CM

## 2018-09-17 DIAGNOSIS — M79676 Pain in unspecified toe(s): Principal | ICD-10-CM

## 2018-09-17 NOTE — Progress Notes (Signed)
Patient dispensed PowerSteps for $55.00  Seemed pleased with OTC inserts.

## 2018-09-22 DIAGNOSIS — M7052 Other bursitis of knee, left knee: Secondary | ICD-10-CM | POA: Diagnosis not present

## 2018-09-23 ENCOUNTER — Other Ambulatory Visit: Payer: Self-pay

## 2018-09-29 ENCOUNTER — Other Ambulatory Visit: Payer: Self-pay | Admitting: Family Medicine

## 2018-09-29 DIAGNOSIS — M1189 Other specified crystal arthropathies, multiple sites: Secondary | ICD-10-CM

## 2018-09-29 DIAGNOSIS — E559 Vitamin D deficiency, unspecified: Secondary | ICD-10-CM

## 2018-09-29 DIAGNOSIS — D509 Iron deficiency anemia, unspecified: Secondary | ICD-10-CM

## 2018-09-29 DIAGNOSIS — I10 Essential (primary) hypertension: Secondary | ICD-10-CM

## 2018-09-29 NOTE — Telephone Encounter (Signed)
Last office visit 08/12/2018 for edema.  Last refilled 10/07/2017 for #12 with 3 refills.  Last Vit D 09/25/2017 normal at 48.58 ng/ml.  CPE scheduled for 10/08/2018.

## 2018-10-01 ENCOUNTER — Ambulatory Visit (INDEPENDENT_AMBULATORY_CARE_PROVIDER_SITE_OTHER): Payer: Medicare Other

## 2018-10-01 VITALS — BP 130/82 | HR 62 | Temp 98.0°F | Ht 63.0 in | Wt 222.5 lb

## 2018-10-01 DIAGNOSIS — M1990 Unspecified osteoarthritis, unspecified site: Secondary | ICD-10-CM | POA: Diagnosis not present

## 2018-10-01 DIAGNOSIS — M1120 Other chondrocalcinosis, unspecified site: Secondary | ICD-10-CM | POA: Diagnosis not present

## 2018-10-01 DIAGNOSIS — E559 Vitamin D deficiency, unspecified: Secondary | ICD-10-CM

## 2018-10-01 DIAGNOSIS — Z0000 Encounter for general adult medical examination without abnormal findings: Secondary | ICD-10-CM | POA: Diagnosis not present

## 2018-10-01 DIAGNOSIS — M25569 Pain in unspecified knee: Secondary | ICD-10-CM | POA: Diagnosis not present

## 2018-10-01 DIAGNOSIS — M25461 Effusion, right knee: Secondary | ICD-10-CM | POA: Diagnosis not present

## 2018-10-01 DIAGNOSIS — N390 Urinary tract infection, site not specified: Secondary | ICD-10-CM | POA: Diagnosis not present

## 2018-10-01 DIAGNOSIS — I159 Secondary hypertension, unspecified: Secondary | ICD-10-CM

## 2018-10-01 DIAGNOSIS — R768 Other specified abnormal immunological findings in serum: Secondary | ICD-10-CM | POA: Diagnosis not present

## 2018-10-01 DIAGNOSIS — I10 Essential (primary) hypertension: Secondary | ICD-10-CM

## 2018-10-01 DIAGNOSIS — M25473 Effusion, unspecified ankle: Secondary | ICD-10-CM | POA: Diagnosis not present

## 2018-10-01 LAB — BASIC METABOLIC PANEL
BUN: 19 mg/dL (ref 6–23)
CO2: 30 mEq/L (ref 19–32)
Calcium: 9.7 mg/dL (ref 8.4–10.5)
Chloride: 105 mEq/L (ref 96–112)
Creatinine, Ser: 0.78 mg/dL (ref 0.40–1.20)
GFR: 77.11 mL/min (ref >60.00)
Glucose, Bld: 116 mg/dL — ABNORMAL HIGH (ref 70–99)
Potassium: 4.4 mEq/L (ref 3.5–5.1)
Sodium: 142 mEq/L (ref 135–145)

## 2018-10-01 LAB — VITAMIN D 25 HYDROXY (VIT D DEFICIENCY, FRACTURES): VITD: 35.35 ng/mL (ref 30.00–100.00)

## 2018-10-01 NOTE — Progress Notes (Signed)
Subjective:   [name] is a 72 y.o. female who presents for Medicare Annual (Subsequent) preventive examination.  Review of Systems:  N/A Cardiac Risk Factors include: advanced age (>51men, >68 women);hypertension;obesity (BMI >30kg/m2)     Objective:     Vitals: BP 130/82 (BP Location: Left Arm, Patient Position: Sitting, Cuff Size: Large)   Pulse 62   Temp 98 F (36.7 C) (Oral)   Ht 5\' 3"  (1.6 m) Comment: shoes  Wt 222 lb 8 oz (100.9 kg)   SpO2 97%   BMI 39.41 kg/m   Body mass index is 39.41 kg/m.  Advanced Directives 10/01/2018 08/14/2018 08/01/2018 02/13/2018 01/01/2018 11/22/2017 09/25/2017  Does Patient Have a Medical Advance Directive? No No No No No No No  Does patient want to make changes to medical advance directive? - - - - - - -  Copy of [place] in Chart? - - - - No - copy requested - -  Would patient like information on creating a medical advance directive? No - Patient declined - No - Patient declined - No - Patient declined - No - Patient declined    Tobacco Social History   Tobacco Use  Smoking Status Former Smoker  . Types: Cigarettes  . Last attempt to quit: 11/05/1965  . Years since quitting: 52.9  Smokeless Tobacco Never Used  Tobacco Comment   socially as a teen     Counseling given: No Comment: socially as a teen   Clinical Intake:  Pre-visit preparation completed: Yes  Pain : 0-10 Pain Score: 5  Pain Type: Chronic pain Pain Location: Hip Pain Orientation: Right Pain Onset: More than a month ago Pain Frequency: Constant     Nutritional Status: BMI > 30  Obese Nutritional Risks: None Diabetes: No  How often do you need to have someone help you when you read instructions, pamphlets, or other written materials from your doctor or pharmacy?: 1 - Never What is the last grade level you completed in school?: 12th grade + some college  Interpreter Needed?: No  Comments: pt lives alone Information entered by ::  LPinson, LPN  Past Medical History:  Diagnosis Date  . Allergy   . Anesthesia complication    trouble waking up 2/2 CPAP  . Arthritis   . Asthma in adult   . Cataract 2019   left eye resolved with surgery  . Cervical spondylosis 2012   Jacelyn Grip, [place])  . Choroidal nevus of left eye 03/22/2016  . Chronic venous insufficiency 2016   severe reflux with painful varicosities sees [facility]  . Clotting disorder ([place])    due to vein ablation   . Complication of anesthesia    difficulty coming out due to sleep apnea per pt  . Difficult intubation    PATIENT DENIES   . Diverticulosis    per ct scans 09-2016 and 01-2017  . DVT (deep venous thrombosis) ([place]) 2011   small, developed after venous ablation  . DVT (deep venous thrombosis) ([place])   . Family history of adverse reaction to anesthesia    O2 levels drop upon waking   . Hearing loss sensory, bilateral 2013   mod-severe high freq sensorineural ([facility])  . Hiatal hernia     09-2016 ct scan HP at cancer center   . History of kidney stones 1980s  . History of radiation therapy 07/19/11-08/25/2011   RIGHT AXILLARY REGION/METASTATIC  . Hypertension   . Melanoma ([place]) 06/29/10   MALIGNANT MELANOMA R  SHOULDER/SUPRASCAPULAR BACK s/p interferon chemo and XRT  . Neuromuscular disorder ([facility])    muscle spasms  . OSA (obstructive sleep apnea)    on CPAP  . Pneumonia    2001  . RLS (restless legs syndrome)   . Seasonal allergies   . Sleep apnea    wears cpap  . Tinnitus   . Unspecified vitamin D deficiency 03/18/2013  . Varicose veins of left lower extremity    Past Surgical History:  Procedure Laterality Date  . ABI  2016   WNL  . ANTERIOR CERVICAL DECOMP/DISCECTOMY FUSION N/A 08/22/2015   ANTERIOR CERVICAL DECOMPRESSION FUSION C3/4 - interbody graft and anterior plate; Kristeen Miss, MD  . ANTERIOR CERVICAL DECOMP/DISCECTOMY FUSION N/A 07/02/2016   Procedure: Cervical four- five Cervical five- six Cervical six- seven Anterior  cervical decompression/diskectomy/fusion;  Surgeon: Kristeen Miss, MD;  Location: [facility];  Service: Neurosurgery;  Laterality: N/A;  C4-5 C5-6 C6-7 Anterior cervical decompression/diskectomy/fusion  . BREAST BIOPSY Left 2013   BENIGN  . BREAST SURGERY Right 1990   BX   . CARDIOVASCULAR STRESS TEST  2010   normal stress test, EF 66%  . CATARACT EXTRACTION W/ INTRAOCULAR LENS IMPLANT Left 2019   Dr. Kathrin Penner  . COLONOSCOPY  03/2005   benign polyp, int hem, rpt 5 yrs ([place], [place])  . COLONOSCOPY WITH PROPOFOL N/A 01/06/2018   diverticulosis, decreased sphincter tone, no f/u needed [name], Kirke Corin, MD)  . DEEP AXILLARY SENTINEL NODE BIOPSY / EXCISION  2012   RIGHT  . DEXA  06/2016   WNL  . DILATION AND CURETTAGE OF UTERUS  2010  . ENDOVENOUS ABLATION SAPHENOUS VEIN W/ LASER Left 2010   [facility]  . ENDOVENOUS ABLATION SAPHENOUS VEIN W/ LASER Left 2016   accessory branch [facility] Kellie Simmering)  . ESOPHAGOGASTRODUODENOSCOPY  2006   mod chronic carditis, no H pylori ([place], [place])  . [place]   left  . LUMBAR LAMINECTOMY  2001   L4/5  . MELANOMA EXCISION  2011   Right shoulder, with sentinel lymph node biopsy  . PORT-A-CATH REMOVAL  12/05/2011   Procedure: REMOVAL PORT-A-CATH;  Surgeon: Rolm Bookbinder, MD;  Location: [facility];  Service: General;  Laterality: N/A;  left port removal  . PORTACATH PLACEMENT     left subclavian  . UPPER GASTROINTESTINAL ENDOSCOPY     Family History  Problem Relation Age of Onset  . Cancer Mother 43       uterine  . CAD Father 43       MI  . Hypertension Father 57  . Alzheimer's disease Father 44  . Cancer Cousin        x2, breast  . Diabetes Sister   . Cancer Paternal Grandmother        melanoma, possibly  . Colon cancer Neg Hx   . Colon polyps Neg Hx   . Rectal cancer Neg Hx   . Stomach cancer Neg Hx    Social History   Socioeconomic History  . Marital status: Divorced    Spouse name: Not on file  .  Number of children: 0  . Years of education: Not on file  . Highest education level: Not on file  Occupational History  . Occupation: retired    Fish farm manager: RETIRED    Comment: Government social research officer   Social Needs  . Financial resource strain: Not on file  . Food insecurity:    Worry: Not on file  Inability: Not on file  . Transportation needs:    Medical: Not on file    Non-medical: Not on file  Tobacco Use  . Smoking status: Former Smoker    Types: Cigarettes    Last attempt to quit: 11/05/1965    Years since quitting: 52.9  . Smokeless tobacco: Never Used  . Tobacco comment: socially as a teen  Substance and Sexual Activity  . Alcohol use: No    Alcohol/week: 0.0 standard drinks  . Drug use: No  . Sexual activity: Never  Lifestyle  . Physical activity:    Days per week: Not on file    Minutes per session: Not on file  . Stress: Not on file  Relationships  . Social connections:    Talks on phone: Not on file    Gets together: Not on file    Attends religious service: Not on file    Active member of club or organization: Not on file    Attends meetings of clubs or organizations: Not on file    Relationship status: Not on file  Other Topics Concern  . Not on file  Social History Narrative   Lives alone.  Sister and husband live next door   Lives in [place].   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   Edu: some college   Diet: poor - good water, rare fruits/vegetables    Outpatient Encounter Medications as of 10/01/2018  Medication Sig  . acetaminophen (TYLENOL ARTHRITIS PAIN) 650 MG CR tablet Take 1,300 mg by mouth every 8 (eight) hours as needed for pain.   Marland Kitchen albuterol (PROAIR HFA) 108 (90 Base) MCG/ACT inhaler INHALE 2 PUFFS INTO LUNGS EVERY 6 HOURS AS NEEDED FOR WHEEZING OR SHORTNESS OF BREATH  . albuterol (PROVENTIL) (2.5 MG/3ML) 0.083% nebulizer solution Take 3 mLs (2.5 mg total) by nebulization every 6 (six) hours as needed for wheezing or  shortness of breath.  Marland Kitchen aspirin EC 81 MG tablet Take 81 mg by mouth at bedtime. Melanoma prevention  . cetirizine (ZYRTEC) 10 MG tablet Take 10 mg by mouth daily.  . famotidine (PEPCID) 10 MG tablet Take 10 mg by mouth daily.  . furosemide (LASIX) 40 MG tablet TAKE 1 TABLET BY MOUTH EVERY DAY  . gabapentin (NEURONTIN) 100 MG capsule TAKE 1 CAPSULE BY MOUTH AT BEDTIME  . KLOR-CON M20 20 MEQ tablet TAKE 1 TABLET BY MOUTH TWICE A DAY  . losartan (COZAAR) 50 MG tablet TAKE 1 TABLET EVERY DAY  . meloxicam (MOBIC) 15 MG tablet TAKE 1 TABLET EVERY DAY WITH FOOD (START AFTER PREDNISONE FINISHED)  . montelukast (SINGULAIR) 10 MG tablet TAKE 1 TABLET BY MOUTH DAILY  . Multiple Vitamin (MULTIVITAMIN WITH MINERALS) TABS tablet Take 1 tablet by mouth daily.  . Polyvinyl Alcohol-Povidone (TEARS PLUS OP) Place 1 drop into both eyes daily as needed (dry eyes/ redness/ burning).  Marland Kitchen PRESCRIPTION MEDICATION CPAP  . Probiotic Product (PROBIOTIC DAILY PO) Take 1 tablet by mouth daily.   Marland Kitchen triamcinolone (NASACORT ALLERGY 24HR) 55 MCG/ACT AERO nasal inhaler Place 2 sprays into the nose daily. In each nostril  . Vitamin D, Ergocalciferol, (DRISDOL) 1.25 MG (50000 UT) CAPS capsule TAKE 1 CAPSULE (50,000 UNITS TOTAL) BY MOUTH EVERY 7 (SEVEN) DAYS.  . [DISCONTINUED] colchicine 0.6 MG tablet TAKE 1 TABLET (0.6 MG TOTAL) BY MOUTH DAILY AS NEEDED (GOUT FLARE).  . [DISCONTINUED] naproxen sodium (ALEVE) 220 MG tablet Take 1 tablet (220 mg total) by mouth daily as needed. (  Patient taking differently: Take 220 mg by mouth daily as needed. Takes 2 tablets daily)  . [DISCONTINUED] traMADol (ULTRAM) 50 MG tablet Take 0.5-1 tablets (25-50 mg total) by mouth every 8 (eight) hours as needed for moderate pain (sedation precautions).   No facility-administered encounter medications on file as of 10/01/2018.     Activities of Daily Living In your present state of health, do you have any difficulty performing the following activities:  10/01/2018  Hearing? N  Vision? N  Difficulty concentrating or making decisions? N  Walking or climbing stairs? N  Dressing or bathing? N  Doing errands, shopping? N  Preparing Food and eating ? N  Using the Toilet? N  In the past six months, have you accidently leaked urine? N  Do you have problems with loss of bowel control? N  Managing your Medications? N  Managing your Finances? N  Housekeeping or managing your Housekeeping? N  Some recent data might be hidden    Patient Care Team: [name], MD as PCP - General (Family Medicine) Marin Olp, Rudell Cobb, MD as Consulting Physician (Oncology) Kristeen Miss, MD as Consulting Physician (Neurosurgery) Danella Sensing, MD as Consulting Physician (Dermatology) Rigoberto Noel, MD as Consulting Physician (Pulmonary Disease) Garrel Ridgel, DPM as Consulting Physician (Podiatry) Eulogio Bear, MD as Consulting Physician (Ophthalmology)    Assessment:   This is a routine wellness examination for [place].   Hearing Screening   125Hz  250Hz  500Hz  1000Hz  2000Hz  3000Hz  4000Hz  6000Hz  8000Hz   Right ear:   40 40 40  40    Left ear:   40 40 40  0    Vision Screening Comments: Vision exam in 2019 with Dr. Kathrin Penner   Exercise Activities and Dietary recommendations Current Exercise Habits: The patient does not participate in regular exercise at present, Exercise limited by: orthopedic condition(s)  Goals    . DIET - INCREASE WATER INTAKE     Starting 10/01/2018, I will continue to drink at least 5-8 glasses of water daily.        Fall Risk Fall Risk  10/01/2018 09/25/2017 01/09/2017 09/24/2016 03/19/2016  Falls in the past year? 0 No No No No   Depression Screen PHQ 2/9 Scores 10/01/2018 09/25/2017 09/24/2016 09/23/2015  PHQ - 2 Score 0 0 0 0  PHQ- 9 Score 0 0 - -     Cognitive Function MMSE - Mini Mental State Exam 10/01/2018 09/25/2017 09/24/2016  Orientation to time 5 5 5   Orientation to Place 5 5 5   Registration 3 3 3     Attention/ Calculation 0 0 0  Recall 3 3 3   Language- name 2 objects 0 0 0  Language- repeat 1 1 1   Language- follow 3 step command 3 3 3   Language- read & follow direction 0 0 0  Write a sentence 0 0 0  Copy design 0 0 0  Total score 20 20 20        PLEASE NOTE: A Mini-Cog screen was completed. Maximum score is 20. A value of 0 denotes this part of [place] MMSE was not completed or the patient failed this part of the Mini-Cog screening.   Mini-Cog Screening Orientation to Time - Max 5 pts Orientation to Place - Max 5 pts Registration - Max 3 pts Recall - Max 3 pts Language Repeat - Max 1 pts Language Follow 3 Step Command - Max 3 pts   Immunization History  Administered Date(s) Administered  . Influenza, High Dose Seasonal PF 08/13/2016, 07/31/2017,  07/28/2018  . Influenza, Seasonal, Injecte, Preservative Fre 09/01/2009  . Influenza,inj,Quad PF,6+ Mos 08/06/2013, 09/17/2014, 08/18/2015  . Pneumococcal Conjugate-13 09/22/2014  . Pneumococcal Polysaccharide-23 05/15/2013    Qualifies for Shingles Vaccine?  Screening Tests Health Maintenance  Topic Date Due  . DTaP/Tdap/Td (1 - Tdap) 11/05/2018 (Originally 07/09/1965)  . TETANUS/TDAP  11/05/2018  . MAMMOGRAM  08/13/2019  . COLONOSCOPY  01/07/2023  . INFLUENZA VACCINE  Completed  . DEXA SCAN  Completed  . Hepatitis C Screening  Completed  . PNA vac Low Risk Adult  Completed       Plan:     I have personally reviewed, addressed, and noted the following in the patient's chart:  A. Medical and social history B. Use of alcohol, tobacco or illicit drugs  C. Current medications and supplements D. Functional ability and status E.  Nutritional status F.  Physical activity G. Advance directives H. List of other physicians I.  Hospitalizations, surgeries, and ER visits in previous 12 months J.  [place] to include hearing, vision, cognitive, depression L. Referrals and appointments - none  In addition, I  have reviewed and discussed with patient certain preventive protocols, quality metrics, and best practice recommendations. A written personalized care plan for preventive services as well as general preventive health recommendations were provided to patient.  See attached scanned questionnaire for additional information.   Signed,   Lindell Noe, MHA, BS, LPN Health Coach

## 2018-10-01 NOTE — Progress Notes (Signed)
Nov 2017 CAT scan lower abdomen due to swelling. F/U appt in March 2018. Issue resolved. CAT scan showed tumor on kidney. Would like repeat CAT scan.   Still concerned about swelling in lower extremities.

## 2018-10-01 NOTE — Patient Instructions (Signed)
[name] , Thank you for taking time to come for your Medicare Wellness Visit. I appreciate your ongoing commitment to your health goals. Please review the following plan we discussed and let me know if I can assist you in the future.   These are the goals we discussed: Goals    . DIET - INCREASE WATER INTAKE     Starting 10/01/2018, I will continue to drink at least 5-8 glasses of water daily.        This is a list of the screening recommended for you and due dates:  Health Maintenance  Topic Date Due  . DTaP/Tdap/Td vaccine (1 - Tdap) 11/05/2018*  . Tetanus Vaccine  11/05/2018  . Mammogram  08/13/2019  . Colon Cancer Screening  01/07/2023  . Flu Shot  Completed  . DEXA scan (bone density measurement)  Completed  .  Hepatitis C: One time screening is recommended by Center for Disease Control  (CDC) for  adults born from [birth year] through 1965.   Completed  . Pneumonia vaccines  Completed  *Topic was postponed. The date shown is not the original due date.   Preventive Care for Adults  A healthy lifestyle and preventive care can promote health and wellness. Preventive health guidelines for adults include the following key practices.  . A routine yearly physical is a good way to check with your health care provider about your health and preventive screening. It is a chance to share any concerns and updates on your health and to receive a thorough exam.  . Visit your dentist for a routine exam and preventive care every 6 months. Brush your teeth twice a day and floss once a day. Good oral hygiene prevents tooth decay and gum disease.  . The frequency of eye exams is based on your age, health, family medical history, use  of contact lenses, and other factors. Follow your health care provider's recommendations for frequency of eye exams.  . Eat a healthy diet. Foods like vegetables, fruits, whole grains, low-fat dairy products, and lean protein foods contain the nutrients you need without  too many calories. Decrease your intake of foods high in solid fats, added sugars, and salt. Eat the right amount of calories for you. Get information about a proper diet from your health care provider, if necessary.  . Regular physical exercise is one of the most important things you can do for your health. Most adults should get at least 150 minutes of moderate-intensity exercise (any activity that increases your heart rate and causes you to sweat) each week. In addition, most adults need muscle-strengthening exercises on 2 or more days a week.  Silver Sneakers may be a benefit available to you. To determine eligibility, you may visit the website: www.silversneakers.com or contact program at (216)700-2265 Mon-Fri between 8AM-8PM.   . Maintain a healthy weight. The body mass index (BMI) is a screening tool to identify possible weight problems. It provides an estimate of body fat based on height and weight. Your health care provider can find your BMI and can help you achieve or maintain a healthy weight.   For adults 20 years and older: ? A BMI below 18.5 is considered underweight. ? A BMI of 18.5 to 24.9 is normal. ? A BMI of 25 to 29.9 is considered overweight. ? A BMI of 30 and above is considered obese.   . Maintain normal blood lipids and cholesterol levels by exercising and minimizing your intake of saturated fat. Eat a balanced diet  with plenty of fruit and vegetables. Blood tests for lipids and cholesterol should begin at age 40 and be repeated every 5 years. If your lipid or cholesterol levels are high, you are over 50, or you are at high risk for heart disease, you may need your cholesterol levels checked more frequently. Ongoing high lipid and cholesterol levels should be treated with medicines if diet and exercise are not working.  . If you smoke, find out from your health care provider how to quit. If you do not use tobacco, please do not start.  . If you choose to drink alcohol,  please do not consume more than 2 drinks per day. One drink is considered to be 12 ounces (355 mL) of beer, 5 ounces (148 mL) of wine, or 1.5 ounces (44 mL) of liquor.  . If you are 54-38 years old, ask your health care provider if you should take aspirin to prevent strokes.  . Use sunscreen. Apply sunscreen liberally and repeatedly throughout the day. You should seek shade when your shadow is shorter than you. Protect yourself by wearing long sleeves, pants, a wide-brimmed hat, and sunglasses year round, whenever you are outdoors.  . Once a month, do a whole body skin exam, using a mirror to look at the skin on your back. Tell your health care provider of new moles, moles that have irregular borders, moles that are larger than a pencil eraser, or moles that have changed in shape or color.

## 2018-10-01 NOTE — Progress Notes (Signed)
PCP notes:   Health maintenance:  No gaps identified.   Abnormal screenings:   Hearing - failed  Hearing Screening   125Hz  250Hz  500Hz  1000Hz  2000Hz  3000Hz  4000Hz  6000Hz  8000Hz   Right ear:   40 40 40  40    Left ear:   40 40 40  0     Patient concerns:  "Nov 2017 - CAT scan lower abdomen due to swelling. F/U appt in March 2018. Issue resolved. CAT scan showed tumor on kidney. Would like repeat CAT scan.   Still concerned about swelling in lower extremities.  Nurse concerns:  None  Next PCP appt:   10/08/18 @ 1030

## 2018-10-07 ENCOUNTER — Encounter (HOSPITAL_COMMUNITY): Payer: Medicare Other

## 2018-10-07 ENCOUNTER — Encounter: Payer: Medicare Other | Admitting: Vascular Surgery

## 2018-10-07 DIAGNOSIS — M545 Low back pain: Secondary | ICD-10-CM | POA: Diagnosis not present

## 2018-10-07 NOTE — Progress Notes (Signed)
I reviewed health advisor's note, was available for consultation, and agree with documentation and plan.  

## 2018-10-07 NOTE — Progress Notes (Signed)
BP 132/70 (BP Location: Left Arm, Patient Position: Sitting, Cuff Size: Large)   Pulse 65   Temp 97.6 F (36.4 C) (Oral)   Ht 5\' 3"  (1.6 m)   Wt 222 lb (100.7 kg)   SpO2 93%   BMI 39.33 kg/m    CC: AMW f/u visit Subjective:    Patient ID: [name], female    DOB: 19-[name]-1946, 72 y.o.   MRN: [id number]  HPI: [name] is a 72 y.o. female presenting on 10/08/2018 for Annual Exam (Pt 2. )   Saw Katha Cabal last week for medicare wellness visit. Note reviewed.   Recent knee xrays suggestive of chondrocalcinosis (pseudogout or calcium pyrophosphate disease). Tramadol tried, referred to rheumatology. Saw Dr Isaias Sakai ([facility] rheum). All blood work returned normal, mildly positive ANA. Placed on colchicine course, then prednisone pack, then meloxicam. Colchicine seemed ineffective.   Seeing ortho (Dr Lynann Bologna) for ongoing lower back pain, has pending MRI lumbar spine.   H/o liver cysts and kidney cysts, largest 8.2cm on L. Would like this rechecked.   Preventative: COLONOSCOPY WITH PROPOFOL 01/06/2018 - diverticulosis, decreased sphincter tone, no f/u needed (Danis, Kirke Corin, MD) Well woman exam with OBGYN Dr Kennon Rounds.  Mammogramlast in system 06/2016, WNL. Per patient receives yearly and normal at [facility]. She will call and schedule DEXA Date:8/2017WNL.  Flu shot - yearly Tetanus~2010 Pneumovax 05/2013 , prevnar 09/2014 zostavax - too expensive. shingrix - discussed, declines. H/o shingles.  Advanced directives: doesn't want prolonged life support. Unsure who would be HCPOA - likely one of her sisters. Packet previously provided. Seat belt use discussed. Sunscreen use discussed. No changing moles on skin. Sees dermand oncyearly (h/o melanoma). Ex smoker.  Alcohol - rare  Dentist q6 mo Eye exam yearly  Lives alone. Sister and husband live next door  Lives in [place].  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  Edu: some college Activity: no  regular exercise Diet: poor - good water, rare fruits/vegetables  Relevant past medical, surgical, family and social history reviewed and updated as indicated. Interim medical history since our last visit reviewed. Allergies and medications reviewed and updated. Outpatient Medications Prior to Visit  Medication Sig Dispense Refill  . acetaminophen (TYLENOL ARTHRITIS PAIN) 650 MG CR tablet Take 1,300 mg by mouth every 8 (eight) hours as needed for pain.     Marland Kitchen albuterol (PROAIR HFA) 108 (90 Base) MCG/ACT inhaler INHALE 2 PUFFS INTO LUNGS EVERY 6 HOURS AS NEEDED FOR WHEEZING OR SHORTNESS OF BREATH 8.5 Inhaler 1  . albuterol (PROVENTIL) (2.5 MG/3ML) 0.083% nebulizer solution Take 3 mLs (2.5 mg total) by nebulization every 6 (six) hours as needed for wheezing or shortness of breath. 150 mL 1  . aspirin EC 81 MG tablet Take 81 mg by mouth at bedtime. Melanoma prevention    . cetirizine (ZYRTEC) 10 MG tablet Take 10 mg by mouth daily.    . famotidine (PEPCID) 10 MG tablet Take 10 mg by mouth daily.    Marland Kitchen gabapentin (NEURONTIN) 100 MG capsule TAKE 1 CAPSULE BY MOUTH AT BEDTIME 30 capsule 6  . losartan (COZAAR) 50 MG tablet TAKE 1 TABLET EVERY DAY 30 tablet 2  . meloxicam (MOBIC) 15 MG tablet TAKE 1 TABLET EVERY DAY WITH FOOD (START AFTER PREDNISONE FINISHED)  3  . montelukast (SINGULAIR) 10 MG tablet TAKE 1 TABLET BY MOUTH DAILY 30 tablet 2  . Multiple Vitamin (MULTIVITAMIN WITH MINERALS) TABS tablet Take 1 tablet by mouth daily.    Marland Kitchen  Polyvinyl Alcohol-Povidone (TEARS PLUS OP) Place 1 drop into both eyes daily as needed (dry eyes/ redness/ burning).    Marland Kitchen PRESCRIPTION MEDICATION CPAP    . Probiotic Product (PROBIOTIC DAILY PO) Take 1 tablet by mouth daily.     Marland Kitchen triamcinolone (NASACORT ALLERGY 24HR) 55 MCG/ACT AERO nasal inhaler Place 2 sprays into the nose daily. In each nostril    . Vitamin D, Ergocalciferol, (DRISDOL) 1.25 MG (50000 UT) CAPS capsule TAKE 1 CAPSULE (50,000 UNITS TOTAL) BY MOUTH EVERY 7  (SEVEN) DAYS. 12 capsule 3  . furosemide (LASIX) 40 MG tablet TAKE 1 TABLET BY MOUTH EVERY DAY 30 tablet 2  . KLOR-CON M20 20 MEQ tablet TAKE 1 TABLET BY MOUTH TWICE A DAY 60 tablet 3   No facility-administered medications prior to visit.      Per HPI unless specifically indicated in ROS section below Review of Systems     Objective:    BP 132/70 (BP Location: Left Arm, Patient Position: Sitting, Cuff Size: Large)   Pulse 65   Temp 97.6 F (36.4 C) (Oral)   Ht 5\' 3"  (1.6 m)   Wt 222 lb (100.7 kg)   SpO2 93%   BMI 39.33 kg/m   Wt Readings from Last 3 Encounters:  10/08/18 222 lb (100.7 kg)  10/01/18 222 lb 8 oz (100.9 kg)  08/14/18 227 lb (103 kg)    Physical Exam  Constitutional: She is oriented to person, place, and time. She appears well-developed and well-nourished. No distress.  HENT:  Head: Normocephalic and atraumatic.  Right Ear: Hearing, tympanic membrane, external ear and ear canal normal.  Left Ear: Hearing, tympanic membrane, external ear and ear canal normal.  Nose: Nose normal.  Mouth/Throat: Uvula is midline, oropharynx is clear and moist and mucous membranes are normal. No oropharyngeal exudate, posterior oropharyngeal edema or posterior oropharyngeal erythema.  Eyes: Pupils are equal, round, and reactive to light. Conjunctivae and EOM are normal. No scleral icterus.  Neck: Normal range of motion. Neck supple. No thyromegaly present.  Cardiovascular: Normal rate, regular rhythm, normal heart sounds and intact distal pulses.  No murmur heard. Pulses:      Radial pulses are 2+ on the right side, and 2+ on the left side.  Pulmonary/Chest: Effort normal and breath sounds normal. No respiratory distress. She has no wheezes. She has no rales.  Abdominal: Soft. Bowel sounds are normal. She exhibits no distension and no mass. There is no tenderness. There is no rebound and no guarding.  Musculoskeletal: Normal range of motion. She exhibits no edema.    Lymphadenopathy:    She has no cervical adenopathy.  Neurological: She is alert and oriented to person, place, and time.  CN grossly intact, station and gait intact  Skin: Skin is warm and dry. No rash noted.  Psychiatric: She has a normal mood and affect. Her behavior is normal. Judgment and thought content normal.  Nursing note and vitals reviewed.  Was not fasting for this blood work.  Results for orders placed or performed in visit on 87/56/43  Basic metabolic panel  Result Value Ref Range   Sodium 142 135 - 145 mEq/L   Potassium 4.4 3.5 - 5.1 mEq/L   Chloride 105 96 - 112 mEq/L   CO2 30 19 - 32 mEq/L   Glucose, Bld 116 (H) 70 - 99 mg/dL   BUN 19 6 - 23 mg/dL   Creatinine, Ser 0.78 0.40 - 1.20 mg/dL   Calcium 9.7 8.4 - 10.5 mg/dL  GFR 77.11 >60.00 mL/min  VITAMIN D 25 Hydroxy (Vit-D Deficiency, Fractures)  Result Value Ref Range   VITD 35.35 30.00 - 100.00 ng/mL  No results found for: HGBA1C     Assessment & Plan:   Problem List Items Addressed This Visit    Vitamin D deficiency    Levels remain normal but have decreased. Continue weekly supplementation.      Varicose veins of both lower extremities with complications   Relevant Medications   furosemide (LASIX) 40 MG tablet   OSA (obstructive sleep apnea)    Compliant with CPAP      Liver cyst   Relevant Orders   US Abdomen Complete   Kyphosis of cervical region   Kidney cysts    Large L>R. Will update abd [place] per patient request.       Relevant Orders   US Abdomen Complete   Hypertension    Chronic, stable. Continue current regimen.       Relevant Medications   furosemide (LASIX) 40 MG tablet   Chronic venous insufficiency   Relevant Medications   furosemide (LASIX) 40 MG tablet   Cervical myelopathy with cervical radiculopathy   Calcium pyrophosphate arthropathy of multiple sites - Primary    Now followed by rheum and ortho. Appreciate their care.           Meds ordered this encounter   Medications  . potassium chloride SA (KLOR-CON M20) 20 MEQ tablet    Sig: Take 1 tablet (20 mEq total) by mouth 2 (two) times daily.    Dispense:  60 tablet    Refill:  11    Please fill generic equivalent (cheaper)  . furosemide (LASIX) 40 MG tablet    Sig: Take 1 tablet (40 mg total) by mouth daily.    Dispense:  30 tablet    Refill:  11   Orders Placed This Encounter  Procedures  . US Abdomen Complete    Standing Status:   Future    Standing Expiration Date:   12/10/2019    Order Specific Question:   Reason for Exam (SYMPTOM  OR DIAGNOSIS REQUIRED)    Answer:   f/u liver and kidney cysts    Order Specific Question:   Preferred imaging location?    Answer:   [facility]    Follow up plan: Return in about 6 months (around 04/09/2019) for follow up visit.  [name], MD

## 2018-10-08 ENCOUNTER — Ambulatory Visit (INDEPENDENT_AMBULATORY_CARE_PROVIDER_SITE_OTHER): Payer: Medicare Other | Admitting: Family Medicine

## 2018-10-08 ENCOUNTER — Encounter: Payer: Self-pay | Admitting: Family Medicine

## 2018-10-08 VITALS — BP 132/70 | HR 65 | Temp 97.6°F | Ht 63.0 in | Wt 222.0 lb

## 2018-10-08 DIAGNOSIS — I83893 Varicose veins of bilateral lower extremities with other complications: Secondary | ICD-10-CM

## 2018-10-08 DIAGNOSIS — E559 Vitamin D deficiency, unspecified: Secondary | ICD-10-CM

## 2018-10-08 DIAGNOSIS — I872 Venous insufficiency (chronic) (peripheral): Secondary | ICD-10-CM

## 2018-10-08 DIAGNOSIS — M5412 Radiculopathy, cervical region: Secondary | ICD-10-CM

## 2018-10-08 DIAGNOSIS — G4733 Obstructive sleep apnea (adult) (pediatric): Secondary | ICD-10-CM | POA: Diagnosis not present

## 2018-10-08 DIAGNOSIS — N281 Cyst of kidney, acquired: Secondary | ICD-10-CM | POA: Diagnosis not present

## 2018-10-08 DIAGNOSIS — I159 Secondary hypertension, unspecified: Secondary | ICD-10-CM

## 2018-10-08 DIAGNOSIS — M1189 Other specified crystal arthropathies, multiple sites: Secondary | ICD-10-CM | POA: Diagnosis not present

## 2018-10-08 DIAGNOSIS — G959 Disease of spinal cord, unspecified: Secondary | ICD-10-CM

## 2018-10-08 DIAGNOSIS — M4712 Other spondylosis with myelopathy, cervical region: Secondary | ICD-10-CM

## 2018-10-08 DIAGNOSIS — M40202 Unspecified kyphosis, cervical region: Secondary | ICD-10-CM | POA: Diagnosis not present

## 2018-10-08 DIAGNOSIS — K7689 Other specified diseases of liver: Secondary | ICD-10-CM

## 2018-10-08 MED ORDER — FUROSEMIDE 40 MG PO TABS: 40.0000 mg | ORAL_TABLET | Freq: Every day | ORAL | 11 refills | Status: DC

## 2018-10-08 MED ORDER — POTASSIUM CHLORIDE CRYS ER 20 MEQ PO TBCR: 20.0000 meq | EXTENDED_RELEASE_TABLET | Freq: Two times a day (BID) | ORAL | 11 refills | Status: DC

## 2018-10-08 NOTE — Patient Instructions (Addendum)
Schedule mammogram, ask them to send [place] report.  Return as needed or in 6 months for follow up visit.  We will check abdominal ultrasound to follow kidney and liver cysts.  Good to see you today, call us with questions

## 2018-10-08 NOTE — Assessment & Plan Note (Signed)
Levels remain normal but have decreased. Continue weekly supplementation.

## 2018-10-08 NOTE — Assessment & Plan Note (Signed)
Chronic, stable. Continue current regimen. 

## 2018-10-08 NOTE — Assessment & Plan Note (Signed)
Large L>R. Will update abd [place] per patient request.

## 2018-10-08 NOTE — Assessment & Plan Note (Addendum)
Now followed by rheum and ortho. Appreciate their care.

## 2018-10-08 NOTE — Assessment & Plan Note (Signed)
Compliant with CPAP 

## 2018-10-10 ENCOUNTER — Ambulatory Visit (HOSPITAL_BASED_OUTPATIENT_CLINIC_OR_DEPARTMENT_OTHER)
Admission: RE | Admit: 2018-10-10 | Discharge: 2018-10-10 | Disposition: A | Discharge status: Home / Self Care | Payer: Medicare Other | Source: Ambulatory Visit | Attending: Family Medicine | Admitting: Family Medicine

## 2018-10-10 DIAGNOSIS — K7689 Other specified diseases of liver: Secondary | ICD-10-CM | POA: Insufficient documentation

## 2018-10-10 DIAGNOSIS — K8020 Calculus of gallbladder without cholecystitis without obstruction: Secondary | ICD-10-CM | POA: Diagnosis not present

## 2018-10-10 DIAGNOSIS — N281 Cyst of kidney, acquired: Secondary | ICD-10-CM | POA: Diagnosis not present

## 2018-10-11 DIAGNOSIS — M545 Low back pain: Secondary | ICD-10-CM | POA: Diagnosis not present

## 2018-10-14 ENCOUNTER — Telehealth: Payer: Self-pay

## 2018-10-14 NOTE — Telephone Encounter (Addendum)
Left message on vm for pt to call back.  Need to relay ultrasound results per Dr. Coralie Keens below.]  Results Your ultrasound showed 1 gallstone as well as bilateral liver cysts, same large left kidney cyst and fatty liver changes. All areas were stable and unchanged which is reassuring. I think we can just watch these for now.

## 2018-10-16 ENCOUNTER — Other Ambulatory Visit: Payer: Self-pay | Admitting: Family Medicine

## 2018-10-16 NOTE — Telephone Encounter (Signed)
Refill left on vm at pharmacy.  

## 2018-10-23 DIAGNOSIS — I10 Essential (primary) hypertension: Secondary | ICD-10-CM | POA: Diagnosis not present

## 2018-10-23 DIAGNOSIS — M5126 Other intervertebral disc displacement, lumbar region: Secondary | ICD-10-CM | POA: Diagnosis not present

## 2018-10-23 DIAGNOSIS — Z6839 Body mass index (BMI) 39.0-39.9, adult: Secondary | ICD-10-CM | POA: Diagnosis not present

## 2018-10-30 ENCOUNTER — Ambulatory Visit: Payer: Medicare Other | Admitting: Podiatry

## 2018-11-03 ENCOUNTER — Ambulatory Visit (INDEPENDENT_AMBULATORY_CARE_PROVIDER_SITE_OTHER): Payer: Medicare Other | Admitting: Podiatry

## 2018-11-03 ENCOUNTER — Encounter: Payer: Self-pay | Admitting: Podiatry

## 2018-11-03 DIAGNOSIS — M79676 Pain in unspecified toe(s): Secondary | ICD-10-CM

## 2018-11-03 DIAGNOSIS — B351 Tinea unguium: Secondary | ICD-10-CM | POA: Diagnosis not present

## 2018-11-03 NOTE — Progress Notes (Addendum)
Complaint:  Visit Type: Patient returns to my office for continued preventative foot care services. Complaint: Patient states" my nails have grown long and thick and become painful to walk and wear shoes"  The patient presents for preventative foot care services. No changes to ROS.  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  Podiatric Exam: Vascular: dorsalis pedis and posterior tibial pulses are palpable bilateral. Capillary return is immediate. Temperature gradient is WNL. Skin turgor WNL Varicosities  Feet  B/L  Swelling legs/ankle  B/L. Sensorium: Normal Semmes Weinstein monofilament test. Normal tactile sensation bilaterally. Nail Exam: Pt has thick disfigured discolored nails with subungual debris noted bilateral entire nail hallux through fifth toenails Ulcer Exam: There is no evidence of ulcer or pre-ulcerative changes or infection. Orthopedic Exam: Muscle tone and strength are WNL. No limitations in general ROM. No crepitus or effusions noted. Foot type and digits show no abnormalities. DJD  [name]  B/L Swelling ankles  B/L Skin: No Porokeratosis. No infection or ulcers.  Asymptomatic clavi.  Diagnosis:  Onychomycosis, , Pain in right toe, pain in left toes  Treatment & Plan Procedures and Treatment: Consent by patient was obtained for treatment procedures. The patient understood the discussion of treatment and procedures well. All questions were answered thoroughly reviewed. Debridement of mycotic and hypertrophic toenails, 1 through 5 bilateral and clearing of subungual debris. No ulceration, no infection noted. Patient requests minimal  dremel useleft hallux. Return Visit-Office Procedure: Patient instructed to return to the office for a follow up visit 9 weeks  for continued evaluation and treatment.    Gardiner Barefoot DPM

## 2018-11-19 ENCOUNTER — Other Ambulatory Visit: Payer: Self-pay | Admitting: Family Medicine

## 2018-11-22 ENCOUNTER — Other Ambulatory Visit: Payer: Self-pay | Admitting: Family Medicine

## 2018-12-03 DIAGNOSIS — M5126 Other intervertebral disc displacement, lumbar region: Secondary | ICD-10-CM | POA: Diagnosis not present

## 2018-12-05 ENCOUNTER — Encounter: Payer: Self-pay | Admitting: Family Medicine

## 2018-12-05 ENCOUNTER — Ambulatory Visit (INDEPENDENT_AMBULATORY_CARE_PROVIDER_SITE_OTHER): Payer: Medicare Other | Admitting: Family Medicine

## 2018-12-05 VITALS — BP 120/80 | HR 83 | Temp 97.6°F | Ht 63.0 in | Wt 223.5 lb

## 2018-12-05 DIAGNOSIS — I83893 Varicose veins of bilateral lower extremities with other complications: Secondary | ICD-10-CM | POA: Diagnosis not present

## 2018-12-05 DIAGNOSIS — I83892 Varicose veins of left lower extremities with other complications: Secondary | ICD-10-CM | POA: Diagnosis not present

## 2018-12-05 DIAGNOSIS — I83029 Varicose veins of left lower extremity with ulcer of unspecified site: Secondary | ICD-10-CM

## 2018-12-05 DIAGNOSIS — L97929 Non-pressure chronic ulcer of unspecified part of left lower leg with unspecified severity: Secondary | ICD-10-CM

## 2018-12-05 DIAGNOSIS — J301 Allergic rhinitis due to pollen: Secondary | ICD-10-CM

## 2018-12-05 DIAGNOSIS — R609 Edema, unspecified: Secondary | ICD-10-CM | POA: Diagnosis not present

## 2018-12-05 MED ORDER — AZELASTINE HCL 0.1 % NA SOLN: 1.0000 | Freq: Two times a day (BID) | NASAL | 3 refills | Status: DC

## 2018-12-05 NOTE — Patient Instructions (Addendum)
I think this is more allergic. Continue daily antihistamine (try switch off zyrtec). Try astelin nasal antihistamine spray. Consider nasal saline irrigation.  For recurrent leg venous ulcer - unna boot until wound clinic appointment on Wednesday.

## 2018-12-05 NOTE — Progress Notes (Signed)
BP 120/80 (BP Location: Left Arm, Patient Position: Sitting, Cuff Size: Large)   Pulse 83   Temp 97.6 F (36.4 C) (Oral)   Ht 5\' 3"  (1.6 m)   Wt 223 lb 8 oz (101.4 kg)   SpO2 99%   BMI 39.59 kg/m    CC: check skin lesion Subjective:    Patient ID: [name], female    DOB: April 09, 1946, 73 y.o.   MRN: [id number]  HPI: [name] is a 73 y.o. female presenting on 12/05/2018 for Skin Lesion (C/o sore on medial left lower leg. Noticed about 2 wks ago. Has been treating at home, seems to be worsening. Has appt on 12/10/17 with wound clinic. ) and Sinus Problem (C/o sinus pressure, congestion and drainage. Sxs started about 1 mo ago. Tried Flonase, worked for [employer]. )   2 wk h/o skin sore L medial leg. Started with leg ache, tingling, then developed into sore. She has been treating with large adhesive water block pad. Not healing despite her normal wound care routine (vaseline and bandaid). Has been oozing but deeper than normal. Upcoming wound care visit next Wednesday. Burning pain when exposed to air.   Sinus congestion over last 2 wks that started after she bent over to pick something up. Thick blood tinged mucous (alternating colored, clear) L>R, intermittent sinus congestion and rhinorrhea. Has been treating with flonase without significant benefit - stopped flonase after she noted blood. Continues normal zyrtec (for the past year). Doesn't like to use nasal saline. No fevers/chills, headache, facial pain, cough.   Known chronic venous insufficiency with resultant venous stasis ulcers of BLE latest LLE s/p wound clinic eval released from their care 04/02/2018. Cetaphil helped red rash and swelling. (Dr Asa Saunas).      Relevant past medical, surgical, family and social history reviewed and updated as indicated. Interim medical history since our last visit reviewed. Allergies and medications reviewed and updated. Outpatient Medications Prior to Visit  Medication Sig Dispense Refill    . acetaminophen (TYLENOL ARTHRITIS PAIN) 650 MG CR tablet Take 1,300 mg by mouth every 8 (eight) hours as needed for pain.     Marland Kitchen albuterol (PROVENTIL HFA;VENTOLIN HFA) 108 (90 Base) MCG/ACT inhaler INHALE 2 PUFFS INTO LUNGS EVERY 6 HOURS AS NEEDED FOR WHEEZING OR SHORTNESS OF BREATH 8.5 Inhaler 3  . albuterol (PROVENTIL) (2.5 MG/3ML) 0.083% nebulizer solution Take 3 mLs (2.5 mg total) by nebulization every 6 (six) hours as needed for wheezing or shortness of breath. 150 mL 1  . aspirin EC 81 MG tablet Take 81 mg by mouth at bedtime. Melanoma prevention    . cetirizine (ZYRTEC) 10 MG tablet Take 10 mg by mouth daily.    . famotidine (PEPCID) 10 MG tablet Take 10 mg by mouth daily.    . furosemide (LASIX) 40 MG tablet Take 1 tablet (40 mg total) by mouth daily. 30 tablet 11  . gabapentin (NEURONTIN) 100 MG capsule TAKE 1 CAPSULE BY MOUTH AT BEDTIME 30 capsule 6  . losartan (COZAAR) 50 MG tablet TAKE 1 TABLET EVERY DAY 30 tablet 5  . meloxicam (MOBIC) 15 MG tablet Take 15 mg by mouth daily. As needed  3  . montelukast (SINGULAIR) 10 MG tablet TAKE 1 TABLET BY MOUTH DAILY 30 tablet 2  . Multiple Vitamin (MULTIVITAMIN WITH MINERALS) TABS tablet Take 1 tablet by mouth daily.    . Polyvinyl Alcohol-Povidone (TEARS PLUS OP) Place 1 drop into both eyes daily as needed (dry eyes/ redness/  burning).    . potassium chloride SA (KLOR-CON M20) 20 MEQ tablet Take 1 tablet (20 mEq total) by mouth 2 (two) times daily. 60 tablet 11  . PRESCRIPTION MEDICATION CPAP    . Probiotic Product (PROBIOTIC DAILY PO) Take 1 tablet by mouth daily.     Marland Kitchen triamcinolone (NASACORT ALLERGY 24HR) 55 MCG/ACT AERO nasal inhaler Place 2 sprays into the nose daily. In each nostril    . UNABLE TO FIND Take by mouth.    . Vitamin D, Ergocalciferol, (DRISDOL) 1.25 MG (50000 UT) CAPS capsule TAKE 1 CAPSULE (50,000 UNITS TOTAL) BY MOUTH EVERY 7 (SEVEN) DAYS. 12 capsule 3   No facility-administered medications prior to visit.      Per  HPI unless specifically indicated in ROS section below Review of Systems Objective:    BP 120/80 (BP Location: Left Arm, Patient Position: Sitting, Cuff Size: Large)   Pulse 83   Temp 97.6 F (36.4 C) (Oral)   Ht 5\' 3"  (1.6 m)   Wt 223 lb 8 oz (101.4 kg)   SpO2 99%   BMI 39.59 kg/m   Wt Readings from Last 3 Encounters:  12/05/18 223 lb 8 oz (101.4 kg)  10/08/18 222 lb (100.7 kg)  10/01/18 222 lb 8 oz (100.9 kg)    Physical Exam Vitals signs and nursing note reviewed.  Constitutional:      General: She is not in acute distress.    Appearance: Normal appearance. She is well-developed. She is obese.  HENT:     Head: Normocephalic and atraumatic.     Right Ear: Hearing, tympanic membrane, ear canal and external ear normal.     Left Ear: Hearing, tympanic membrane, ear canal and external ear normal.     Nose: Congestion and rhinorrhea present. No mucosal edema.     Right Sinus: No maxillary sinus tenderness or frontal sinus tenderness.     Left Sinus: No maxillary sinus tenderness or frontal sinus tenderness.     Mouth/Throat:     Mouth: Mucous membranes are moist.     Pharynx: Uvula midline. No oropharyngeal exudate or posterior oropharyngeal erythema.     Tonsils: No tonsillar abscesses.  Eyes:     General: No scleral icterus.    Conjunctiva/sclera: Conjunctivae normal.     Pupils: Pupils are equal, round, and reactive to light.  Neck:     Musculoskeletal: Normal range of motion and neck supple.  Musculoskeletal:     Comments: 1+ DP  Lymphadenopathy:     Cervical: No cervical adenopathy.  Skin:    General: Skin is warm and dry.     Findings: Wound (1.5x1cm, superficial without surrounding erythema, smaller wound anteriorly) present. No rash.     Comments: See picture  Neurological:     Mental Status: She is alert.  Psychiatric:        Mood and Affect: Mood normal.            Assessment & Plan:   Problem List Items Addressed This Visit    Venous stasis ulcer  of left lower leg with edema of left lower leg (HCC) - Primary    Recurrent, not healing as previously. No signs of infection at this time. Will place in unna boot. She has already schceduled f/u with wound clinic next week. Will defer further evaluation to their clinic.       Varicose veins of both lower extremities with complications   Allergic rhinitis    Anticipate current symptoms are allergic  rhinitis flare - treat with astelin nasal spray. Discussed trial of change in antihistamine. No signs of bacterial infection at this time. Update with effect.           Meds ordered this encounter  Medications  . azelastine (ASTELIN) 0.1 % nasal spray    Sig: Place 1 spray into both nostrils 2 (two) times daily. Use in each nostril as directed    Dispense:  30 mL    Refill:  3   No orders of the defined types were placed in this encounter.   Patient Instructions  I think this is more allergic. Continue daily antihistamine (try switch off zyrtec). Try astelin nasal antihistamine spray. Consider nasal saline irrigation.  For recurrent leg venous ulcer - unna boot until wound clinic appointment on Wednesday.    Follow up plan: No follow-ups on file.  [name], MD

## 2018-12-06 NOTE — Assessment & Plan Note (Signed)
Recurrent, not healing as previously. No signs of infection at this time. Will place in unna boot. She has already schceduled f/u with wound clinic next week. Will defer further evaluation to their clinic.

## 2018-12-06 NOTE — Assessment & Plan Note (Signed)
Anticipate current symptoms are allergic rhinitis flare - treat with astelin nasal spray. Discussed trial of change in antihistamine. No signs of bacterial infection at this time. Update with effect.

## 2018-12-10 ENCOUNTER — Encounter: Payer: Medicare Other | Attending: Internal Medicine | Admitting: Internal Medicine

## 2018-12-10 DIAGNOSIS — Z809 Family history of malignant neoplasm, unspecified: Secondary | ICD-10-CM | POA: Diagnosis not present

## 2018-12-10 DIAGNOSIS — L97211 Non-pressure chronic ulcer of right calf limited to breakdown of skin: Secondary | ICD-10-CM | POA: Diagnosis not present

## 2018-12-10 DIAGNOSIS — E11622 Type 2 diabetes mellitus with other skin ulcer: Secondary | ICD-10-CM | POA: Diagnosis not present

## 2018-12-10 DIAGNOSIS — Z87442 Personal history of urinary calculi: Secondary | ICD-10-CM | POA: Diagnosis not present

## 2018-12-10 DIAGNOSIS — Z8249 Family history of ischemic heart disease and other diseases of the circulatory system: Secondary | ICD-10-CM | POA: Insufficient documentation

## 2018-12-10 DIAGNOSIS — M11261 Other chondrocalcinosis, right knee: Secondary | ICD-10-CM | POA: Diagnosis not present

## 2018-12-10 DIAGNOSIS — J45909 Unspecified asthma, uncomplicated: Secondary | ICD-10-CM | POA: Diagnosis not present

## 2018-12-10 DIAGNOSIS — Z85820 Personal history of malignant melanoma of skin: Secondary | ICD-10-CM | POA: Diagnosis not present

## 2018-12-10 DIAGNOSIS — Z882 Allergy status to sulfonamides status: Secondary | ICD-10-CM | POA: Insufficient documentation

## 2018-12-10 DIAGNOSIS — E876 Hypokalemia: Secondary | ICD-10-CM | POA: Insufficient documentation

## 2018-12-10 DIAGNOSIS — I890 Lymphedema, not elsewhere classified: Secondary | ICD-10-CM | POA: Insufficient documentation

## 2018-12-10 DIAGNOSIS — M4802 Spinal stenosis, cervical region: Secondary | ICD-10-CM | POA: Insufficient documentation

## 2018-12-10 DIAGNOSIS — Z86718 Personal history of other venous thrombosis and embolism: Secondary | ICD-10-CM | POA: Insufficient documentation

## 2018-12-10 DIAGNOSIS — Z836 Family history of other diseases of the respiratory system: Secondary | ICD-10-CM | POA: Insufficient documentation

## 2018-12-10 DIAGNOSIS — Z841 Family history of disorders of kidney and ureter: Secondary | ICD-10-CM | POA: Diagnosis not present

## 2018-12-10 DIAGNOSIS — I872 Venous insufficiency (chronic) (peripheral): Secondary | ICD-10-CM | POA: Insufficient documentation

## 2018-12-10 DIAGNOSIS — I10 Essential (primary) hypertension: Secondary | ICD-10-CM | POA: Diagnosis not present

## 2018-12-10 DIAGNOSIS — L97322 Non-pressure chronic ulcer of left ankle with fat layer exposed: Secondary | ICD-10-CM | POA: Diagnosis not present

## 2018-12-10 DIAGNOSIS — G4733 Obstructive sleep apnea (adult) (pediatric): Secondary | ICD-10-CM | POA: Insufficient documentation

## 2018-12-10 DIAGNOSIS — Z87891 Personal history of nicotine dependence: Secondary | ICD-10-CM | POA: Insufficient documentation

## 2018-12-10 DIAGNOSIS — M1990 Unspecified osteoarthritis, unspecified site: Secondary | ICD-10-CM | POA: Insufficient documentation

## 2018-12-10 DIAGNOSIS — I87312 Chronic venous hypertension (idiopathic) with ulcer of left lower extremity: Secondary | ICD-10-CM | POA: Diagnosis not present

## 2018-12-11 NOTE — Progress Notes (Signed)
[name], Amber ([id number]) Visit Report for 12/10/2018 Chief Complaint Document Details Patient Name: [name], [name]. Date of Service: 12/10/2018 1:00 PM Medical Record Number: [id number] Patient Account Number: [id number] Date of Birth/Sex: 11-30-1945 (73 y.o. F) Treating RN: Cornell Barman Primary Care Provider: [name] Other Clinician: Referring Provider: Referral, Self Treating Provider/Extender: Tito Dine in Treatment: 0 Information Obtained from: Patient Chief Complaint Left calf venous stasis ulceration. 11/13/16; the patient is here for a left calf recurrent ulceration which is painful and has been present for the last month 01/22/17; the patient re-presents here for recurrent difficulties with blistering and leaking fluid on her left posterior calf 12/10/18; the patient returns to clinic for a wound on the left medial calf Electronic Signature(s) Signed: 12/10/2018 5:51:47 PM By: Linton Ham MD Entered By: Linton Ham on 12/10/2018 14:19:42 Mooty, [name] ([id number]) -------------------------------------------------------------------------------- HPI Details Patient Name: Amber, [name]. Date of Service: 12/10/2018 1:00 PM Medical Record Number: [id number] Patient Account Number: [id number] Date of Birth/Sex: 1946-08-25 (73 y.o. F) Treating RN: Cornell Barman Primary Care Provider: [name] Other Clinician: Referring Provider: Referral, Self Treating Provider/Extender: Tito Dine in Treatment: 0 History of Present Illness HPI Description: Pleasant 72 year old with history of chronic venous insufficiency. No diabetes or peripheral vascular disease. Left ABI 1.29. Questionable history of left lower extremity DVT. She developed a recurrent ulceration on her left lateral calf in December 2015, which she attributes to poor diet and subsequent lower extremity edema. She underwent endovenous laser ablation of her left greater saphenous vein in  2010. She underwent laser ablation of accessory branch of left GSV in April 2016 by Dr. Kellie Simmering at [facility]. She was previously wearing Unna boots, which she tolerated well. Tolerating 2 layer compression and cadexomer iodine. She returns to clinic for follow-up and is without new complaints. She denies any significant pain at this time. She reports persistent pain with pressure. No claudication or ischemic rest pain. No fever or chills. No drainage. READMISSION 11/13/16; this is a 73 year old woman who is not a diabetic. She is here for a review of a painful area on her left medial lower extremity. I note that she was seen here previously last year for wound I believe to be in the same area. At that time she had undergone previously a left greater saphenous vein ablation by Dr. Kellie Simmering and she had a ablation of the anterior accessory branch of the left greater saphenous vein in March 2016. Seeing that the wound actually closed over. In reviewing the history with her today the ulcer in this area has been recurrent. She describes a biopsy of this area in 2009 that only showed stasis physiology. 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 Do not see that she has ever had arterial studies in the left leg. She tells me she has a follow-up with Dr. Kellie Simmering in roughly 10 days In any case she developed the reopening of this area roughly a month ago. On the background of this she describes rapidly increasing edema which has responded to Lasix 40 mg and metolazone 2.5 mg as well as the patient's lymph massage. She has been told she has both venous insufficiency and lymphedema but she cannot tolerate compression stockings 11/28/16; the patient saw Dr.  Kellie Simmering recently. Per the patient he did  arterial Dopplers in the office that did not show evidence of arterial insufficiency, per the patient he stated "treat this like an ordinary venous ulcer". She also saw her dermatologist Dr. Ronnald Ramp who felt that this was more of a vascular ulcer. In general things are improving although she arrives today with increasing bilateral lower extremity edema with weeping a deeper fluid through the wound on the left medial leg compatible with some degree of lymphedema 12/04/16; the patient's wound is fully epithelialized but I don't think fully healed. We will do another week of depression with Promogran and TCA however I suspect we'll be able to discharge her next week. This is a very unusual-looking wound which was initially a figure-of-eight type wound lying on its side surrounded by petechial like hemorrhage. She has had venous ablation on this side. She apparently does not have an arterial issue per Dr. Kellie Simmering. She saw her dermatologist thought it was "vascular". Patient is definitely going to need ongoing compression and I talked about this with her today she will go to elastic therapy after she leaves here next week 12/11/16; the patient's wound is not completely closed today. She has surrounding scar tissue and in further discussion with the patient it would appear that she had ulcers in this area in 2009 for a prolonged period of time ultimately requiring a punch biopsy of this area that only showed venous insufficiency. I did not previously pickup on this part of the history from the patient. 12/18/16; the patient's wound is completely epithelialized. There is no open area here. She has significant bilateral venous insufficiency with secondary lymphedema to a mild-to-moderate degree she does not have compression stockings.. She did not say anything to me when I was in the room, she told our intake nurse that she was still having pain in this area. This isn't unusual recurrent small open area. She is  going to go to elastic therapy to obtain compression stockings. 12/25/16; the patient's wound is fully epithelialized. There is no open area here. The patient describes some continued episodic discomfort in this area medial left calf. However everything looks fine and healed here. She is been to elastic therapy and caught herself 15-20 mmHg stockings, they apparently were having trouble getting 20-30 mm stockings in her size AURI, JAHNKE ([id number]) 01/22/17; this is a patient we discharged from the clinic a month ago. She has a recurrent open wound on her medial left calf. She had 15 mm support stockings. I told her I thought she needed 20-30 mm compression stockings. She tells me that she has been ill with hospitalization secondary to asthma and is been found to have severe hypokalemia likely secondary to a combination of Lasix and metolazone. This morning she noted blistering and leaking fluid on the posterior part of her left leg. She called our intake nurse urgently and we was saw her this afternoon. She has not had any real discomfort here. I don't know that she's been wearing any stockings on this leg for at least 2-3 days. ABIs in this clinic were 1.21 on the right and 1.3 on the left. She is previously seen vascular surgery who does not think that there is a peripheral arterial issue. 01/30/17; Patient arrives with no open wound on the left leg. She has been to elastic therapy and obtained 20-41mmhg below knee stockings and she has one on the right leg today. READMISSION 02/19/18; this Colello is a now 73 year old patient we've  had in this clinic perhaps 3 times before. I had last looked at her from January 07 December 2016 with an area on the medial left leg. We discharged her on 12/25/16 however she had to be readmitted on 01/22/17 with a recurrence. I have in my notes that we discharged her on 20-30 mm stockings although she tells me she was only wearing support hose because she cannot get  stockings on predominantly related to her cervical spine surgery/issues. She has had previous ablations done by vein and vascular in [place] including a great saphenous vein ablation on the left with an anterior accessory branch ablation I think both of these were in 2016. On one of the previous visit she had a biopsy noted 2009 that was negative. She is not felt to have an arterial issue. She is not a diabetic. She does have a history of obstructive sleep apnea hypertension asthma as well as chronic venous insufficiency and lymphedema. On this occasion she noted 2 dry scaly patch on her left leg. She tried to put lotion on this it didn't really help. There were 2 open areas.the patient has been seeing her primary physician from 02/05/18 through 02/14/18. She had Unna boots applied. The superior wound now on the lateral left leg has closed but she's had one wound that remains open on the lateral left leg. This is not the same spot as we dealt with in 2018. ABIs in this clinic were 1.3 bilaterally 02/26/18; patient has a small wound on the left lateral calf. Dimensions are down. She has chronic venous insufficiency and lymphedema. 03/05/18; small open area on the left lateral calf. Dimensions are down. Tightly adherent necrotic debris over the surface of the wound which was difficult to remove. Also the dressing [over collagen] stuck to the wound surface. This was removed with some difficulty as well. Change the primary dressing to Hydrofera Blue ready 03/12/18; small open area on the left lateral calf. Comes in with tightly adherent surface eschar as well as some adherent Hydrofera Blue. 03/19/18; open area on the left lateral calf. Again adherent surface eschar as well as some adherent Hydrofera Blue nonviable subcutaneous tissue. She complained of pain all week even with the reduction from 4-3 layer compression I put on last week. Also she had an increase in her ankle and calf measurements probably  related to the same thing. 03/26/18; open area on the left lateral calf. A very small open area remains here. We used silver alginate starting last week as the Hydrofera Blue seem to stick to the wound bed. In using 4-layer compression 04/02/18; the open area in the left lateral calf at some adherent slough which I removed there is no open area here. We are able to transition her into her own compression stocking. Truthfully I think this is probably his support hose. However this does not maintain skin integrity will be limited. She cannot put over the toe compression stockings on because of neck problems hand problems etc. She is allergic to the lining layer of juxta lites. We might be forced to use extremitease stocking should this fail READMIT 11/24/2018 Patient is now a 73 year old woman who is not a diabetic. She has been in this clinic on at least 3 previous occasions largely with recurrent wounds on her left leg secondary to chronic venous insufficiency with secondary lymphedema. Her situation is complicated by inability to get stockings on and an allergy to neoprene which is apparently a component and at least juxta lites and  other stockings. As a result she really has not been wearing any stockings on her legs. She tells [place] that roughly 2 or 3 weeks ago she started noticing a stinging sensation just above her ankle on the left medial aspect. She has been diagnosed with pseudogout and she wondered whether this was what she was experiencing. She tried to dress this with something she bought at the store however subsequently it pulled skin off and now she has an open wound that is not improving. She has been using Vaseline gauze with a cover bandage. She saw her primary doctor last week who put an Haematologist on her. ABIs in this clinic was 1.03 on the left JENELL, DOBRANSKY ([id number]) Electronic Signature(s) Signed: 12/10/2018 5:51:47 PM By: Linton Ham MD Entered By: Linton Ham on  12/10/2018 14:22:16 SAKARI, RAISANEN ([id number]) -------------------------------------------------------------------------------- Physical Exam Details Patient Name: LAURENCE, FOLZ. Date of Service: 12/10/2018 1:00 PM Medical Record Number: [id number] Patient Account Number: [id number] Date of Birth/Sex: [birth year] (73 y.o. F) Treating RN: Cornell Barman Primary Care Provider: [name] Other Clinician: Referring Provider: Referral, Self Treating Provider/Extender: Tito Dine in Treatment: 0 Constitutional Sitting or standing Blood Pressure is within target range for patient.. Pulse regular and within target range for patient.Marland Kitchen Respirations regular, non-labored and within target range.. Temperature is normal and within the target range for the patient.Marland Kitchen appears in no distress. Eyes Conjunctivae clear. No discharge. Respiratory Respiratory effort is easy and symmetric bilaterally. Rate is normal at rest and on room air.. Bilateral breath sounds are clear and equal in all lobes with no wheezes, rales or rhonchi.. Cardiovascular Heart rhythm and rate regular, without murmur or gallop.. Femoral pulses palpable on the left. Pedal pulses are palpable on the left. Edema present in both extremities. She has lymphedema in the left leg with fibrosed skin distally "inverted bottle sign". Lymphatic None palpable in the left popliteal or inguinal area. Musculoskeletal She does indeed have a fair amount of tenderness along the joint line of the ankle especially medially but I see no evidence of infection. Integumentary (Hair, Skin) Lymphedema in the left leg. Tightly fibrosed skin distally. Psychiatric No evidence of depression, anxiety, or agitation. Calm, cooperative, and communicative. Appropriate interactions and affect.. Notes Wound exam; the patient has the same irritated wound on the left medial calf actually a small area with a more anterior satellite area. This is very  tender but without any erythema. Electronic Signature(s) Signed: 12/10/2018 5:51:47 PM By: Linton Ham MD Entered By: Linton Ham on 12/10/2018 14:25:03 [name] ([id number]) -------------------------------------------------------------------------------- Physician Orders Details Patient Name: CYDNEY, ALVARENGA. Date of Service: 12/10/2018 1:00 PM Medical Record Number: [id number] Patient Account Number: [id number] Date of Birth/Sex: 1946-07-23 (73 y.o. F) Treating RN: Cornell Barman Primary Care Provider: [name] Other Clinician: Referring Provider: Referral, Self Treating Provider/Extender: Tito Dine in Treatment: 0 Verbal / Phone Orders: No Diagnosis Coding Wound Cleansing Wound #5 Left,Medial Lower Leg o Clean wound with Normal Saline. o May Shower, gently pat wound dry prior to applying new dressing. Anesthetic (add to Medication List) Wound #5 Left,Medial Lower Leg o Topical Lidocaine 4% cream applied to wound bed prior to debridement (In Clinic Only). Skin Barriers/Peri-Wound Care Wound #5 Left,Medial Lower Leg o Triamcinolone Acetonide Ointment (TCA) Primary Wound Dressing Wound #5 Left,Medial Lower Leg o Silver Alginate Secondary Dressing Wound #5 Left,Medial Lower Leg o ABD pad Dressing Change Frequency Wound #5 Left,Medial Lower Leg o Change dressing every week Follow-up Appointments  Wound #5 Left,Medial Lower Leg o Return Appointment in 1 week. o Nurse Visit as needed Edema Control Wound #5 Left,Medial Lower Leg o 3 Layer Compression System - Left Lower Extremity Electronic Signature(s) Signed: 12/10/2018 5:28:11 PM By: [name], [name], RN, CWS, [name], [name] Signed: 12/10/2018 5:51:47 PM By: Linton Ham MD Entered By: [name], [name], RN, CWS, Kim on 12/10/2018 14:09:59 ZENYA, HICKAM ([id number], GRIEGO ([id number]) -------------------------------------------------------------------------------- Problem List  Details Patient Name: SAAYA, PROCELL. Date of Service: 12/10/2018 1:00 PM Medical Record Number: [id number] Patient Account Number: [id number] Date of Birth/Sex: 04-25-1946 (73 y.o. F) Treating RN: Cornell Barman Primary Care Provider: [name] Other Clinician: Referring Provider: Referral, Self Treating Provider/Extender: Tito Dine in Treatment: 0 Active Problems ICD-10 Evaluated Encounter Code Description Active Date Today Diagnosis L97.211 Non-pressure chronic ulcer of right calf limited to breakdown 12/10/2018 No Yes of skin I87.321 Chronic venous hypertension (idiopathic) with inflammation of 12/10/2018 No Yes right lower extremity I89.0 Lymphedema, not elsewhere classified 12/10/2018 No Yes Inactive Problems Resolved Problems Electronic Signature(s) Signed: 12/10/2018 5:51:47 PM By: Linton Ham MD Entered By: Linton Ham on 12/10/2018 14:18:38 Pinegar, [name] ([id number]) -------------------------------------------------------------------------------- Progress Note/History and Physical Details Patient Name: BELLAROSE, BURTT. Date of Service: 12/10/2018 1:00 PM Medical Record Number: [id number] Patient Account Number: [id number] Date of Birth/Sex: 31-[name]-1946 (73 y.o. F) Treating RN: Cornell Barman Primary Care Provider: [name] Other Clinician: Referring Provider: Referral, Self Treating Provider/Extender: Tito Dine in Treatment: 0 Subjective Chief Complaint Information obtained from Patient Left calf venous stasis ulceration. 11/13/16; the patient is here for a left calf recurrent ulceration which is painful and has been present for the last month 01/22/17; the patient re-presents here for recurrent difficulties with blistering and leaking fluid on her left posterior calf 12/10/18; the patient returns to clinic for a wound on the left medial calf History of Present Illness (HPI) Pleasant 73 year old with history of chronic venous  insufficiency. No diabetes or peripheral vascular disease. Left ABI 1.29. Questionable history of left lower extremity DVT. She developed a recurrent ulceration on her left lateral calf in December 2015, which she attributes to poor diet and subsequent lower extremity edema. She underwent endovenous laser ablation of her left greater saphenous vein in 2010. She underwent laser ablation of accessory branch of left GSV in April 2016 by Dr. Kellie Simmering at [facility]. She was previously wearing Unna boots, which she tolerated well. Tolerating 2 layer compression and cadexomer iodine. She returns to clinic for follow-up and is without new complaints. She denies any significant pain at this time. She reports persistent pain with pressure. No claudication or ischemic rest pain. No fever or chills. No drainage. READMISSION 11/13/16; this is a 73 year old woman who is not a diabetic. She is here for a review of a painful area on her left medial lower extremity. I note that she was seen here previously last year for wound I believe to be in the same area. At that time she had undergone previously a left greater saphenous vein ablation by Dr. Kellie Simmering and she had a ablation of the anterior accessory branch of the left greater saphenous vein in March 2016. Seeing that the wound actually closed over. In reviewing the history with her today the ulcer in this area has been recurrent. She describes a biopsy of this area in 2009 that only showed stasis physiology. 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  [place] of oncology and in August of this year she had surgery for cervical spinal stenosis which left her with an improving Horner's syndrome on the left eye. Do not see that she has ever had arterial studies in the left leg. She tells me she has a follow-up with Dr. Kellie Simmering in roughly 10 days In any case she developed the reopening of this area roughly a month  ago. On the background of this she describes rapidly increasing edema which has responded to Lasix 40 mg and metolazone 2.5 mg as well as the patient's lymph massage. She has been told she has both venous insufficiency and lymphedema but she cannot tolerate compression stockings 11/28/16; the patient saw Dr. Kellie Simmering recently. Per the patient he did arterial Dopplers in the office that did not show evidence of arterial insufficiency, per the patient he stated "treat this like an ordinary venous ulcer". She also saw her dermatologist Dr. Ronnald Ramp who felt that this was more of a vascular ulcer. In general things are improving although she arrives today with increasing bilateral lower extremity edema with weeping a deeper fluid through the wound on the left medial leg compatible with some degree of lymphedema 12/04/16; the patient's wound is fully epithelialized but I don't think fully healed. We will do another week of depression with Promogran and TCA however I suspect we'll be able to discharge her next week. This is a very unusual-looking wound which was initially a figure-of-eight type wound lying on its side surrounded by petechial like hemorrhage. She has had venous ablation on this side. She apparently does not have an arterial issue per Dr. Kellie Simmering. She saw her dermatologist thought it was "vascular". Patient is definitely going to need ongoing compression and I talked about this with her today she will go to elastic therapy after she leaves here next week 12/11/16; the patient's wound is not completely closed today. She has surrounding scar tissue and in further discussion with the patient it would appear that she had ulcers in this area in 2009 for a prolonged period of time ultimately requiring a punch biopsy of this area that only showed venous insufficiency. I did not previously pickup on this part of the history from the [place]. ([id number]) patient. 12/18/16; the patient's wound is  completely epithelialized. There is no open area here. She has significant bilateral venous insufficiency with secondary lymphedema to a mild-to-moderate degree she does not have compression stockings.. She did not say anything to me when I was in the room, she told our intake nurse that she was still having pain in this area. This isn't unusual recurrent small open area. She is going to go to elastic therapy to obtain compression stockings. 12/25/16; the patient's wound is fully epithelialized. There is no open area here. The patient describes some continued episodic discomfort in this area medial left calf. However everything looks fine and healed here. She is been to elastic therapy and caught herself 15-20 mmHg stockings, they apparently were having trouble getting 20-30 mm stockings in her size 01/22/17; this is a patient we discharged from the clinic a month ago. She has a recurrent open wound on her medial left calf. She had 15 mm support stockings. I told her I thought she needed 20-30 mm compression stockings. She tells me that she has been ill with hospitalization secondary to asthma and is been found to have severe hypokalemia likely secondary to a combination of Lasix and metolazone. This morning she noted blistering and leaking  fluid on the posterior part of her left leg. She called our intake nurse urgently and we was saw her this afternoon. She has not had any real discomfort here. I don't know that she's been wearing any stockings on this leg for at least 2-3 days. ABIs in this clinic were 1.21 on the right and 1.3 on the left. She is previously seen vascular surgery who does not think that there is a peripheral arterial issue. 01/30/17; Patient arrives with no open wound on the left leg. She has been to elastic therapy and obtained 20-33mmhg below knee stockings and she has one on the right leg today. READMISSION 02/19/18; this Wolven is a now 73 year old patient we've had in this  clinic perhaps 3 times before. I had last looked at her from January 07 December 2016 with an area on the medial left leg. We discharged her on 12/25/16 however she had to be readmitted on 01/22/17 with a recurrence. I have in my notes that we discharged her on 20-30 mm stockings although she tells me she was only wearing support hose because she cannot get stockings on predominantly related to her cervical spine surgery/issues. She has had previous ablations done by vein and vascular in [place] including a great saphenous vein ablation on the left with an anterior accessory branch ablation I think both of these were in 2016. On one of the previous visit she had a biopsy noted 2009 that was negative. She is not felt to have an arterial issue. She is not a diabetic. She does have a history of obstructive sleep apnea hypertension asthma as well as chronic venous insufficiency and lymphedema. On this occasion she noted 2 dry scaly patch on her left leg. She tried to put lotion on this it didn't really help. There were 2 open areas.the patient has been seeing her primary physician from 02/05/18 through 02/14/18. She had Unna boots applied. The superior wound now on the lateral left leg has closed but she's had one wound that remains open on the lateral left leg. This is not the same spot as we dealt with in 2018. ABIs in this clinic were 1.3 bilaterally 02/26/18; patient has a small wound on the left lateral calf. Dimensions are down. She has chronic venous insufficiency and lymphedema. 03/05/18; small open area on the left lateral calf. Dimensions are down. Tightly adherent necrotic debris over the surface of the wound which was difficult to remove. Also the dressing [over collagen] stuck to the wound surface. This was removed with some difficulty as well. Change the primary dressing to Hydrofera Blue ready 03/12/18; small open area on the left lateral calf. Comes in with tightly adherent surface eschar as  well as some adherent Hydrofera Blue. 03/19/18; open area on the left lateral calf. Again adherent surface eschar as well as some adherent Hydrofera Blue nonviable subcutaneous tissue. She complained of pain all week even with the reduction from 4-3 layer compression I put on last week. Also she had an increase in her ankle and calf measurements probably related to the same thing. 03/26/18; open area on the left lateral calf. A very small open area remains here. We used silver alginate starting last week as the Hydrofera Blue seem to stick to the wound bed. In using 4-layer compression 04/02/18; the open area in the left lateral calf at some adherent slough which I removed there is no open area here. We are able to transition her into her own compression stocking. Truthfully I think  this is probably his support hose. However this does not maintain skin integrity will be limited. She cannot put over the toe compression stockings on because of neck problems hand problems etc. She is allergic to the lining layer of juxta lites. We might be forced to use extremitease stocking should this fail READMIT 11/24/2018 Patient is now a 73 year old woman who is not a diabetic. She has been in this clinic on at least 3 previous occasions largely with recurrent wounds on her left leg secondary to chronic venous insufficiency with secondary lymphedema. Her situation is complicated by inability to get stockings on and an allergy to neoprene which is apparently a component and at least juxta lites and other stockings. As a result she really has not been wearing any stockings on her legs. ELGIE, LANDINO ([id number]) She tells [place] that roughly 2 or 3 weeks ago she started noticing a stinging sensation just above her ankle on the left medial aspect. She has been diagnosed with pseudogout and she wondered whether this was what she was experiencing. She tried to dress this with something she bought at the store however  subsequently it pulled skin off and now she has an open wound that is not improving. She has been using Vaseline gauze with a cover bandage. She saw her primary doctor last week who put an Haematologist on her. ABIs in this clinic was 1.03 on the left Wound History Patient presents with 1 open wound that has been present for approximately 3 weeks. Patient has been treating wound in the following manner: unna boot. Laboratory tests have been performed in the last month. Patient reportedly has not tested positive for an antibiotic resistant organism. Patient reportedly has not tested positive for osteomyelitis. Patient reportedly has not had testing performed to evaluate circulation in the legs. Patient experiences the following problems associated with their wounds: swelling. Patient History Information obtained from Patient. Allergies Sulfa (Sulfonamide Antibiotics) (Reaction: facial swelling), latex, Neoprene Family History Cancer - Mother,Paternal Grandparents, Hypertension - Mother,Father, Kidney Disease - Paternal Grandparents, Lung Disease - Paternal Grandparents, No family history of Diabetes, Heart Disease, Hereditary Spherocytosis, Seizures, Stroke, Thyroid Problems, Tuberculosis. Social History Former smoker - quit at age 66, Marital Status - Divorced, Alcohol Use - Never, Drug Use - No History, Caffeine Use - Daily. Medical History Eyes Patient has history of Cataracts Denies history of Glaucoma, Optic Neuritis Ear/Nose/Mouth/Throat Denies history of Chronic sinus problems/congestion, Middle ear problems Hematologic/Lymphatic Denies history of Anemia, Hemophilia, Human Immunodeficiency Virus, Lymphedema, Sickle Cell Disease Respiratory Patient has history of Asthma, Sleep Apnea Denies history of Aspiration, Chronic Obstructive Pulmonary Disease (COPD), Pneumothorax, Tuberculosis Cardiovascular Patient has history of Deep Vein Thrombosis - LLE 2010, Hypertension, Peripheral  Venous Disease Denies history of Angina, Arrhythmia, Congestive Heart Failure, Coronary Artery Disease, Hypotension, Myocardial Infarction, Peripheral Arterial Disease, Phlebitis, Vasculitis Gastrointestinal Denies history of Cirrhosis , Colitis, Crohn s, Hepatitis A, Hepatitis B, Hepatitis C Endocrine Denies history of Type I Diabetes, Type II Diabetes Genitourinary Denies history of End Stage Renal Disease Immunological Denies history of Lupus Erythematosus, Raynaud s, Scleroderma Integumentary (Skin) Denies history of History of Burn, History of pressure wounds Musculoskeletal Patient has history of Osteoarthritis - neck Denies history of Gout, Rheumatoid Arthritis, Osteomyelitis Neurologic Cromie, Cambreigh J. ([id number]) Denies history of Dementia, Neuropathy, Quadriplegia, Paraplegia, Seizure Disorder Oncologic Patient has history of Received Chemotherapy - interfeon immunotherapy, Received Radiation Psychiatric Denies history of Anorexia/bulimia, Confinement Anxiety Hospitalization/Surgery History - 08/05/2010, [facility], Melanoma sx. Medical And Surgical History  Notes Constitutional Symptoms (General Health) Vein ablation LLE 2010 Vascular Sx ( vein ablationo) LLE 02/2015 Eyes Horners syndrome Genitourinary Kidney stones Neurologic ct scan showed swollen lymph nodes Oncologic Melanoma (R) shoulder 07/2010; (R) axillary lymphnode removal Review of Systems (ROS) Eyes Denies complaints or symptoms of Dry Eyes, Vision Changes, Glasses / Contacts. Ear/Nose/Mouth/Throat Denies complaints or symptoms of Difficult clearing ears, Sinusitis. Hematologic/Lymphatic Denies complaints or symptoms of Bleeding / Clotting Disorders, Human Immunodeficiency Virus. Respiratory Denies complaints or symptoms of Chronic or frequent coughs, Shortness of Breath. Cardiovascular Complains or has symptoms of LE edema. Denies complaints or symptoms of Chest pain. Gastrointestinal Denies complaints or  symptoms of Frequent diarrhea, Nausea, Vomiting. Endocrine Denies complaints or symptoms of Hepatitis, Thyroid disease, Polydypsia (Excessive Thirst). Genitourinary Denies complaints or symptoms of Kidney failure/ Dialysis, Incontinence/dribbling. Immunological Denies complaints or symptoms of Hives, Itching. Integumentary (Skin) Complains or has symptoms of Wounds, Swelling. Denies complaints or symptoms of Bleeding or bruising tendency, Breakdown. Musculoskeletal Denies complaints or symptoms of Muscle Pain, Muscle Weakness. Neurologic Denies complaints or symptoms of Numbness/parasthesias, Focal/Weakness. Objective Constitutional NIVA, MURREN. ([id number]) Sitting or standing Blood Pressure is within target range for patient.. Pulse regular and within target range for patient.Marland Kitchen Respirations regular, non-labored and within target range.. Temperature is normal and within the target range for the patient.Marland Kitchen appears in no distress. Vitals Time Taken: 1:02 PM, Height: 63 in, Source: Stated, Weight: 224.7 lbs, Source: Measured, BMI: 39.8, Temperature: 98.1 F, Pulse: 64 bpm, Respiratory Rate: 16 breaths/min, Blood Pressure: 144/66 mmHg. Eyes Conjunctivae clear. No discharge. Respiratory Respiratory effort is easy and symmetric bilaterally. Rate is normal at rest and on room air.. Bilateral breath sounds are clear and equal in all lobes with no wheezes, rales or rhonchi.. Cardiovascular Heart rhythm and rate regular, without murmur or gallop.. Femoral pulses palpable on the left. Pedal pulses are palpable on the left. Edema present in both extremities. She has lymphedema in the left leg with fibrosed skin distally "inverted bottle sign". Lymphatic None palpable in the left popliteal or inguinal area. Musculoskeletal She does indeed have a fair amount of tenderness along the joint line of the ankle especially medially but I see no evidence of infection. Psychiatric No evidence of  depression, anxiety, or agitation. Calm, cooperative, and communicative. Appropriate interactions and affect.. General Notes: Wound exam; the patient has the same irritated wound on the left medial calf actually a small area with a more anterior satellite area. This is very tender but without any erythema. Integumentary (Hair, Skin) Lymphedema in the left leg. Tightly fibrosed skin distally. Wound #5 status is Open. Original cause of wound was Gradually Appeared. The wound is located on the Left,Medial Lower Leg. The wound measures 3.4cm length x 3.2cm width x 0.1cm depth; 8.545cm^2 area and 0.855cm^3 volume. There is Fat Layer (Subcutaneous Tissue) Exposed exposed. There is no tunneling or undermining noted. There is a medium amount of serosanguineous drainage noted. The wound margin is indistinct and nonvisible. There is large (67-100%) red granulation within the wound bed. There is a small (1-33%) amount of necrotic tissue within the wound bed including Adherent Slough. The periwound skin appearance exhibited: Maceration, Hemosiderin Staining. The periwound skin appearance did not exhibit: Callus, Crepitus, Excoriation, Induration, Rash, Scarring, Dry/Scaly, Atrophie Blanche, Cyanosis, Ecchymosis, Mottled, Pallor, Rubor, Erythema. Periwound temperature was noted as No Abnormality. The periwound has tenderness on palpation. Assessment Active Problems ICD-10 Non-pressure chronic ulcer of right calf limited to breakdown of skin Chronic venous hypertension (idiopathic) with inflammation of  right lower extremity Lymphedema, not elsewhere classified LABRITTANY, WECHTER ([id number]) Plan Wound Cleansing: Wound #5 Left,Medial Lower Leg: Clean wound with Normal Saline. May Shower, gently pat wound dry prior to applying new dressing. Anesthetic (add to Medication List): Wound #5 Left,Medial Lower Leg: Topical Lidocaine 4% cream applied to wound bed prior to debridement (In Clinic Only). Skin  Barriers/Peri-Wound Care: Wound #5 Left,Medial Lower Leg: Triamcinolone Acetonide Ointment (TCA) Primary Wound Dressing: Wound #5 Left,Medial Lower Leg: Silver Alginate Secondary Dressing: Wound #5 Left,Medial Lower Leg: ABD pad Dressing Change Frequency: Wound #5 Left,Medial Lower Leg: Change dressing every week Follow-up Appointments: Wound #5 Left,Medial Lower Leg: Return Appointment in 1 week. Nurse Visit as needed Edema Control: Wound #5 Left,Medial Lower Leg: 3 Layer Compression System - Left Lower Extremity 1. Silver alginate ABDs under 3 layer compression 2. She will leave this on all week. We used TCA under the silver alginate as well 3. She was not wearing a stocking at the time this formed. She apparently [facility] Don[facility] who recommended a copper support stocking. I am not at all sure though what the benefit of copper is in venous ulcers. I think any support stocking that does not have the material she is allergic and would be satisfactory. Electronic Signature(s) Signed: 12/10/2018 5:51:47 PM By: Linton Ham MD Entered By: Linton Ham on 12/10/2018 14:26:35 [name] ([id number]) -------------------------------------------------------------------------------- ROS/PFSH Details Patient Name: BRITTLYN, CLOE. Date of Service: 12/10/2018 1:00 PM Medical Record Number: [id number] Patient Account Number: [id number] Date of Birth/Sex: September 29, 1946 (73 y.o. F) Treating RN: Montey Hora Primary Care Provider: [name] Other Clinician: Referring Provider: Referral, Self Treating Provider/Extender: Tito Dine in Treatment: 0 Label Progress Note Print Version as History and Physical for this encounter Information Obtained From Patient Wound History Do you currently have one or more open woundso Yes How many open wounds do you currently haveo 1 Approximately how long have you had your woundso 3 weeks How have you  been treating your wound(s) until nowo unna boot Has your wound(s) ever healed and then re-openedo No Have you had any lab work done in the past montho Yes Who ordered the lab work doneo PCP Have you tested positive for an antibiotic resistant organism (MRSA, VRE)o No Have you tested positive for osteomyelitis (bone infection)o No Have you had any tests for circulation on your legso No Have you had other problems associated with your woundso Swelling Eyes Complaints and Symptoms: Negative for: Dry Eyes; Vision Changes; Glasses / Contacts Medical History: Positive for: Cataracts Negative for: Glaucoma; Optic Neuritis Past Medical History Notes: Horners syndrome Ear/Nose/Mouth/Throat Complaints and Symptoms: Negative for: Difficult clearing ears; Sinusitis Medical History: Negative for: Chronic sinus problems/congestion; Middle ear problems Hematologic/Lymphatic Complaints and Symptoms: Negative for: Bleeding / Clotting Disorders; Human Immunodeficiency Virus Medical History: Negative for: Anemia; Hemophilia; Human Immunodeficiency Virus; Lymphedema; Sickle Cell Disease Respiratory Complaints and Symptoms: Negative for: Chronic or frequent coughs; Shortness of Breath Medical History: KIALEE, KHAM ([id number]) Positive for: Asthma; Sleep Apnea Negative for: Aspiration; Chronic Obstructive Pulmonary Disease (COPD); Pneumothorax; Tuberculosis Cardiovascular Complaints and Symptoms: Positive for: LE edema Negative for: Chest pain Medical History: Positive for: Deep Vein Thrombosis - LLE 2010; Hypertension; Peripheral Venous Disease Negative for: Angina; Arrhythmia; Congestive Heart Failure; Coronary Artery Disease; Hypotension; Myocardial Infarction; Peripheral Arterial Disease; Phlebitis; Vasculitis Gastrointestinal Complaints and Symptoms: Negative for: Frequent diarrhea; Nausea; Vomiting Medical History: Negative for: Cirrhosis ; Colitis; Crohnos; Hepatitis A; Hepatitis  B; Hepatitis C Endocrine Complaints and Symptoms: Negative for: Hepatitis; Thyroid disease; Polydypsia (Excessive Thirst) Medical History: Negative for: Type I Diabetes; Type II Diabetes Genitourinary Complaints and Symptoms: Negative for: Kidney failure/ Dialysis; Incontinence/dribbling Medical History: Negative for: End Stage Renal Disease Past Medical History Notes: Kidney stones Immunological Complaints and Symptoms: Negative for: Hives; Itching Medical History: Negative for: Lupus Erythematosus; Raynaudos; Scleroderma Integumentary (Skin) Complaints and Symptoms: Positive for: Wounds; Swelling Negative for: Bleeding or bruising tendency; Breakdown Medical History: Negative for: History of Burn; History of pressure wounds Musculoskeletal ELYNORE, DOLINSKI ([id number]) Complaints and Symptoms: Negative for: Muscle Pain; Muscle Weakness Medical History: Positive for: Osteoarthritis - neck Negative for: Gout; Rheumatoid Arthritis; Osteomyelitis Neurologic Complaints and Symptoms: Negative for: Numbness/parasthesias; Focal/Weakness Medical History: Negative for: Dementia; Neuropathy; Quadriplegia; Paraplegia; Seizure Disorder Past Medical History Notes: ct scan showed swollen lymph nodes Constitutional Symptoms (General Health) Medical History: Past Medical History Notes: Vein ablation LLE 2010 Vascular Sx ( vein ablationo) LLE 02/2015 Oncologic Medical History: Positive for: Received Chemotherapy - interfeon immunotherapy; Received Radiation Past Medical History Notes: Melanoma (R) shoulder 07/2010; (R) axillary lymphnode removal Psychiatric Medical History: Negative for: Anorexia/bulimia; Confinement Anxiety HBO Extended History Items Eyes: Cataracts Immunizations Pneumococcal Vaccine: Received Pneumococcal Vaccination: Yes Immunization Notes: tetanus shot w/in the last 5 years per pt Implantable Devices Hospitalization / Surgery History Name of Hospital  Purpose of Hospitalization/[place] Date [facility] Melanoma sx 08/05/2010 Family and Social History Cancer: Yes - Mother,Paternal Grandparents; Diabetes: No; Heart Disease: No; Hereditary Spherocytosis: No; Hypertension: Yes - Mother,Father; Kidney Disease: Yes - Paternal Grandparents; Lung Disease: Yes - Paternal Grandparents; Seizures: No; Stroke: No; Thyroid Problems: No; Tuberculosis: No; Former smoker - quit at age 10; Marital Status - Divorced; Alcohol Use: Never; Drug Use: No History; Caffeine Use: Daily; Financial Concerns: No; Food, Clothing or Shelter Needs: No; NATORIA, ARCHIBALD ([id number]) Support System Lacking: No; Transportation Concerns: No; Advanced Directives: No; Patient does not want information on Advanced Directives; Do not resuscitate: No; Living Will: No; Medical Power of Attorney: No Electronic Signature(s) Signed: 12/10/2018 5:48:53 PM By: Montey Hora Signed: 12/10/2018 5:51:47 PM By: Linton Ham MD Entered By: Montey Hora on 12/10/2018 13:16:50 TENAE, GRAZIOSI ([id number]) -------------------------------------------------------------------------------- SuperBill Details Patient Name: RUBINA, BASINSKI. Date of Service: 12/10/2018 Medical Record Number: [id number] Patient Account Number: [id number] Date of Birth/Sex: 1946-05-11 (73 y.o. F) Treating RN: Cornell Barman Primary Care Provider: [name] Other Clinician: Referring Provider: Referral, Self Treating Provider/Extender: Tito Dine in Treatment: 0 Diagnosis Coding ICD-10 Codes Code Description X72.620 Non-pressure chronic ulcer of right calf limited to breakdown of skin I87.321 Chronic venous hypertension (idiopathic) with inflammation of right lower extremity I89.0 Lymphedema, not elsewhere classified Facility Procedures CPT4 Code: 35597416 Description: 99213 - WOUND CARE VISIT-LEV 3 EST PT Modifier: Quantity: 1 Physician Procedures CPT4 Code Description: [id number] [place] - WC PHYS LEVEL 4 -  EST PT ICD-10 Diagnosis Description L97.211 Non-pressure chronic ulcer of right calf limited to breakdown I87.321 Chronic venous hypertension (idiopathic) with inflammation of I89.0 Lymphedema, not  elsewhere classified Modifier: of skin right lower extr Quantity: 1 emity Electronic Signature(s) Signed: 12/10/2018 5:51:47 PM By: Linton Ham MD Entered By: Linton Ham on 12/10/2018 14:26:52

## 2018-12-11 NOTE — Progress Notes (Signed)
[name] ([id number]) Visit Report for 12/10/2018 Abuse/Suicide Risk Screen Details Patient Name: [name], [name]. Date of Service: 12/10/2018 1:00 PM Medical Record Number: [id number] Patient Account Number: [id number] Date of Birth/Sex: November 04, 1946 (73 y.o. F) Treating RN: Montey Hora Primary Care [name] Gillentine: [name] Other Clinician: Referring Keats Kingry: Referral, Self Treating Rosemaria Inabinet/Extender: Tito Dine in Treatment: 0 Abuse/Suicide Risk Screen Items Answer ABUSE/SUICIDE RISK SCREEN: Has anyone close to you tried to hurt or harm you recentlyo No Do you feel uncomfortable with anyone in your familyo No Has anyone forced you do things that you didnot want to doo No Do you have any thoughts of harming yourselfo No Patient displays signs or symptoms of abuse and/or neglect. No Electronic Signature(s) Signed: 12/10/2018 5:48:53 PM By: Montey Hora Entered By: Montey Hora on 12/10/2018 13:13:43 [name] ([id number]) -------------------------------------------------------------------------------- Activities of Daily Living Details Patient Name: [name], [name]. Date of Service: 12/10/2018 1:00 PM Medical Record Number: [id number] Patient Account Number: [id number] Date of Birth/Sex: 1946-09-17 (73 y.o. F) Treating RN: Montey Hora Primary Care Ineta Sinning: [name] Other Clinician: Referring Nicko Daher: Referral, Self Treating Jeremie Abdelaziz/Extender: Tito Dine in Treatment: 0 Activities of Daily Living Items Answer Activities of Daily Living (Please select one for each item) Drive Automobile Completely Able Take Medications Completely Able Use Telephone Completely Able Care for Appearance Completely Able Use Toilet Completely Able Bath / Shower Completely Able Dress Self Completely Able Feed Self Completely Able Walk Completely Able Get In / Out Bed Completely Able Housework Completely Able Prepare Meals Completely Able Handle  Money Completely Able Shop for Self Completely Able Electronic Signature(s) Signed: 12/10/2018 5:48:53 PM By: Montey Hora Entered By: Montey Hora on 12/10/2018 13:14:02 [name] ([id number]) -------------------------------------------------------------------------------- Education Assessment Details Patient Name: [name], [name]. Date of Service: 12/10/2018 1:00 PM Medical Record Number: [id number] Patient Account Number: [id number] Date of Birth/Sex: [birth year] (73 y.o. F) Treating RN: Montey Hora Primary Care Rohin Krejci: [name] Other Clinician: Referring Jamai Dolce: Referral, Self Treating Alverda Nazzaro/Extender: Tito Dine in Treatment: 0 Primary Learner Assessed: Patient Learning Preferences/Education Level/Primary Language Learning Preference: Explanation, Demonstration Highest Education Level: College or Above Preferred Language: English Cognitive Barrier Assessment/Beliefs Language Barrier: No Translator Needed: No Memory Deficit: No Emotional Barrier: No Cultural/Religious Beliefs Affecting Medical Care: No Physical Barrier Assessment Impaired Vision: No Impaired Hearing: No Decreased Hand dexterity: No Knowledge/Comprehension Assessment Knowledge Level: Medium Comprehension Level: Medium Ability to understand written Medium instructions: Ability to understand verbal Medium instructions: Motivation Assessment Anxiety Level: Calm Cooperation: Cooperative Education Importance: Acknowledges Need Interest in Health Problems: Asks Questions Perception: Coherent Willingness to Engage in Self- Medium Management Activities: Readiness to Engage in Self- Medium Management Activities: Electronic Signature(s) Signed: 12/10/2018 5:48:53 PM By: Montey Hora Entered By: Montey Hora on 12/10/2018 13:14:23 [name] ([id number]) -------------------------------------------------------------------------------- Fall Risk Assessment  Details Patient Name: [name]. Date of Service: 12/10/2018 1:00 PM Medical Record Number: [id number] Patient Account Number: [id number] Date of Birth/Sex: [birth year] (73 y.o. F) Treating RN: Montey Hora Primary Care Faustina Gebert: [name] Other Clinician: Referring Vivianne Carles: Referral, Self Treating Zauria Dombek/Extender: Tito Dine in Treatment: 0 Fall Risk Assessment Items Have you had 2 or more falls in the last 12 monthso 0 No Have you had any fall that resulted in injury in the last 12 monthso 0 No FALL RISK ASSESSMENT: History of falling - immediate or within 3 months 0 No Secondary diagnosis 0 No Ambulatory aid None/bed rest/wheelchair/nurse 0 Yes Crutches/cane/walker 0 No Furniture  0 No IV Access/Saline Lock 0 No Gait/Training Normal/bed rest/immobile 0 No Weak 10 Yes Impaired 0 No Mental Status Oriented to own ability 0 Yes Electronic Signature(s) Signed: 12/10/2018 5:48:53 PM By: Montey Hora Entered By: Montey Hora on 12/10/2018 13:14:35 [name] ([id number]) -------------------------------------------------------------------------------- Foot Assessment Details Patient Name: [name], [name]. Date of Service: 12/10/2018 1:00 PM Medical Record Number: [id number] Patient Account Number: [id number] Date of Birth/Sex: [birth year] (73 y.o. F) Treating RN: Montey Hora Primary Care Klein Willcox: [name] Other Clinician: Referring Jakyron Fabro: Referral, Self Treating Calaya Gildner/Extender: Tito Dine in Treatment: 0 Foot Assessment Items Site Locations + = Sensation present, - = Sensation absent, C = Callus, U = Ulcer R = Redness, W = Warmth, M = Maceration, PU = Pre-ulcerative lesion F = Fissure, S = Swelling, D = Dryness Assessment Right: Left: Other Deformity: No No Prior Foot Ulcer: No No Prior Amputation: No No Charcot Joint: No No Ambulatory Status: Ambulatory Without Help Gait: Steady Electronic  Signature(s) Signed: 12/10/2018 5:48:53 PM By: Montey Hora Entered By: Montey Hora on 12/10/2018 13:15:00 [name] ([id number]) -------------------------------------------------------------------------------- Nutrition Risk Assessment Details Patient Name: [name], [name]. Date of Service: 12/10/2018 1:00 PM Medical Record Number: [id number] Patient Account Number: [id number] Date of Birth/Sex: 04/19/1946 (73 y.o. F) Treating RN: Montey Hora Primary Care Schylar Allard: [name] Other Clinician: Referring Parminder Cupples: Referral, Self Treating Carson Meche/Extender: Tito Dine in Treatment: 0 Height (in): 63 Weight (lbs): 224.7 Body Mass Index (BMI): 39.8 Nutrition Risk Assessment Items NUTRITION RISK SCREEN: I have an illness or condition that made me change the kind and/or amount of 0 No food I eat I eat fewer than two meals per day 0 No I eat few fruits and vegetables, or milk products 0 No I have three or more drinks of beer, liquor or wine almost every day 0 No I have tooth or mouth problems that make it hard for me to eat 0 No I don't always have enough money to buy the food I need 0 No I eat alone most of the time 0 No I take three or more different prescribed or over-the-counter drugs a day 1 Yes Without wanting to, I have lost or gained 10 pounds in the last six months 0 No I am not always physically able to shop, cook and/or feed myself 0 No Nutrition Protocols Good Risk Protocol 0 No interventions needed Moderate Risk Protocol Electronic Signature(s) Signed: 12/10/2018 5:48:53 PM By: Montey Hora Entered By: Montey Hora on 12/10/2018 13:14:44

## 2018-12-12 NOTE — Progress Notes (Signed)
Amber, [name] ([id number]) Visit Report for 12/10/2018 Allergy List Details Patient Name: [name], [name]. Date of Service: 12/10/2018 1:00 PM Medical Record Number: [id number] Patient Account Number: [id number] Date of Birth/Sex: 1946/09/27 (73 y.o. F) Treating RN: Montey Hora Primary Care Freddye Cardamone: [name] Other Clinician: Referring Ousman Dise: Referral, Self Treating Ihan Pat/Extender: [name] Dillon Weeks in Treatment: 0 Allergies Active Allergies Sulfa (Sulfonamide Antibiotics) Reaction: facial swelling latex Neoprene Allergy Notes Electronic Signature(s) Signed: 12/10/2018 5:48:53 PM By: Montey Hora Entered By: Montey Hora on 12/10/2018 13:10:47 Hoganson, [name] ([id number]) -------------------------------------------------------------------------------- Arrival Information Details Patient Name: Amber, [name]. Date of Service: 12/10/2018 1:00 PM Medical Record Number: [id number] Patient Account Number: [id number] Date of Birth/Sex: March 04, 1946 (73 y.o. F) Treating RN: Cornell Barman Primary Care Yotam Rhine: [name] Other Clinician: Referring Temara Lanum: Referral, Self Treating Pantera Winterrowd/Extender: Tito Dine in Treatment: 0 Visit Information Patient Arrived: Ambulatory Arrival Time: 13:00 Accompanied By: self Transfer Assistance: None Patient Identification Verified: Yes Secondary Verification Process Completed: Yes History Since Last Visit Added or deleted any medications: No Any new allergies or adverse reactions: No Had a fall or experienced change in activities of daily living that may affect [name] of falls: No Signs or symptoms of abuse/neglect since last visito No Hospitalized since last visit: No Implantable device outside of the clinic excluding cellular tissue based products placed in the center since last visit: No Electronic Signature(s) Signed: 12/10/2018 4:36:22 PM By: Lorine Bears RCP, RRT, CHT Entered By:  Lorine Bears on 12/10/2018 13:02:28 [name] ([id number]) -------------------------------------------------------------------------------- Clinic Level of Care Assessment Details Patient Name: Amber, [name]. Date of Service: 12/10/2018 1:00 PM Medical Record Number: [id number] Patient Account Number: [id number] Date of Birth/Sex: 1946/06/29 (73 y.o. F) Treating RN: Cornell Barman Primary Care Jemia Fata: [name] Other Clinician: Referring Jatziri Goffredo: Referral, Self Treating Inaya Gillham/Extender: Tito Dine in Treatment: 0 Clinic Level of Care Assessment Items TOOL 1 Quantity Score []  - Use when EandM and Procedure is performed on INITIAL visit 0 ASSESSMENTS - Nursing Assessment / Reassessment X - General Physical Exam (combine w/ comprehensive assessment (listed just below) when 1 20 performed on new pt. evals) X- 1 25 Comprehensive Assessment (HX, ROS, [name] Assessments, Wounds Hx, etc.) ASSESSMENTS - Wound and Skin Assessment / Reassessment []  - Dermatologic / Skin Assessment (not related to wound area) 0 ASSESSMENTS - Ostomy and/or Continence Assessment and Care []  - Incontinence Assessment and Management 0 []  - 0 Ostomy Care Assessment and Management (repouching, etc.) PROCESS - Coordination of Care X - Simple Patient / Family Education for ongoing care 1 15 []  - 0 Complex (extensive) Patient / Family Education for ongoing care []  - 0 Staff obtains Programmer, systems, Records, Test Results / Process Orders []  - 0 Staff telephones HHA, Nursing Homes / Clarify orders / etc []  - 0 Routine Transfer to another Facility (non-emergent condition) []  - 0 Routine Hospital Admission (non-emergent condition) X- 1 15 New Admissions / Biomedical engineer / Ordering NPWT, Apligraf, etc. []  - 0 Emergency Hospital Admission (emergent condition) PROCESS - Special Needs []  - Pediatric / Minor Patient Management 0 []  - 0 Isolation Patient Management []  -  0 Hearing / Language / Visual special needs []  - 0 Assessment of Community assistance (transportation, D/C planning, etc.) []  - 0 Additional assistance / Altered mentation []  - 0 Support Surface(s) Assessment (bed, cushion, seat, etc.) Simon, Krisanne J. ([id number]) INTERVENTIONS - Miscellaneous []  - External ear exam 0 []  - 0 Patient Transfer (multiple staff /  Hoyer Lift / Similar devices) []  - 0 Simple Staple / Suture removal (25 or less) []  - 0 Complex Staple / Suture removal (26 or more) []  - 0 Hypo/Hyperglycemic Management (do not check if billed separately) X- 1 15 Ankle / Brachial Index (ABI) - do not check if billed separately Has the patient been seen at the hospital within the last three years: Yes Total Score: 90 Level Of Care: New/Established - Level 3 Electronic Signature(s) Signed: 12/10/2018 5:28:11 PM By: [name], [name], RN, CWS, [name], [name] Entered By: [name], [name], RN, CWS, Kim on 12/10/2018 14:10:21 [name] ([id number]) -------------------------------------------------------------------------------- Encounter Discharge Information Details Patient Name: Amber, [name]. Date of Service: 12/10/2018 1:00 PM Medical Record Number: [id number] Patient Account Number: [id number] Date of Birth/Sex: 1946-05-06 (73 y.o. F) Treating RN: Harold Barban Primary Care Macall Mccroskey: [name] Other Clinician: Referring Dalani Mette: Referral, Self Treating Paola Aleshire/Extender: Tito Dine in Treatment: 0 Encounter Discharge Information Items Discharge Condition: Stable Ambulatory Status: Ambulatory Discharge Destination: Home Transportation: Private Auto Accompanied By: self Schedule Follow-up Appointment: Yes Clinical Summary of Care: Electronic Signature(s) Signed: 12/11/2018 11:43:02 AM By: Harold Barban Entered By: Harold Barban on 12/10/2018 14:27:00 Schenk, [name]  ([id number]) -------------------------------------------------------------------------------- Lower Extremity Assessment Details Patient Name: Amber, [name]. Date of Service: 12/10/2018 1:00 PM Medical Record Number: [id number] Patient Account Number: [id number] Date of Birth/Sex: [birth year] (73 y.o. F) Treating RN: Montey Hora Primary Care Kishawn Pickar: [name] Other Clinician: Referring Isayah Ignasiak: Referral, Self Treating Falon Huesca/Extender: Tito Dine in Treatment: 0 Edema Assessment Assessed: [Left: No] [Right: No] Edema: [Left: Ye] [Right: s] Calf Left: Right: Point of Measurement: 32 cm From Medial Instep 46.2 cm cm Ankle Left: Right: Point of Measurement: 12 cm From Medial Instep 25 cm cm Vascular Assessment Pulses: Dorsalis Pedis Palpable: [Left:Yes] Doppler Audible: [Left:Yes] Posterior Tibial Palpable: [Left:No] Doppler Audible: [Left:Yes] Extremity colors, hair growth, and conditions: Extremity Color: [Left:Hyperpigmented] Hair Growth on Extremity: [Left:Yes] Temperature of Extremity: [Left:Warm] Capillary Refill: [Left:< 3 seconds] Blood Pressure: Brachial: [Left:138] Dorsalis Pedis: 142 [Left:Dorsalis Pedis:] Ankle: Posterior Tibial: 132 [Left:Posterior Tibial: 1.03] Toe Nail Assessment Left: Right: Thick: Yes Discolored: Yes Deformed: No Improper Length and Hygiene: Yes Electronic Signature(s) Signed: 12/10/2018 5:48:53 PM By: Montey Hora Entered By: Montey Hora on 12/10/2018 13:33:57 Zayed, [name] ([id number]) [place], Kamarah J. ([id number]) -------------------------------------------------------------------------------- Multi Wound Chart Details Patient Name: [name], [name]. Date of Service: 12/10/2018 1:00 PM Medical Record Number: [id number] Patient Account Number: [id number] Date of Birth/Sex: 09-22-1946 (73 y.o. F) Treating RN: Cornell Barman Primary Care Angelise Petrich: [name] Other Clinician: Referring Almadelia Looman: Referral,  Self Treating Dylan Ruotolo/Extender: Tito Dine in Treatment: 0 Vital Signs Height(in): 63 Pulse(bpm): 70 Weight(lbs): 224.7 Blood Pressure(mmHg): 144/66 Body Mass Index(BMI): 40 Temperature(F): 98.1 Respiratory Rate 16 (breaths/min): Photos: [N/A:N/A] Wound Location: Left Lower Leg - Medial N/A N/A Wounding Event: Gradually Appeared N/A N/A Primary Etiology: Lymphedema N/A N/A Comorbid History: Cataracts, Asthma, Sleep N/A N/A Apnea, Deep Vein Thrombosis, Hypertension, Peripheral Venous Disease, Osteoarthritis, Received Chemotherapy, Received Radiation Date Acquired: 11/19/2018 N/A N/A Weeks of Treatment: 0 N/A N/A Wound Status: Open N/A N/A Measurements L x W x D 3.4x3.2x0.1 N/A N/A (cm) Area (cm) : 8.545 N/A N/A Volume (cm) : 0.855 N/A N/A Classification: Full Thickness Without N/A N/A Exposed Support Structures Exudate Amount: Medium N/A N/A Exudate Type: Serosanguineous N/A N/A Exudate Color: red, brown N/A N/A Wound Margin: Indistinct, nonvisible N/A N/A Granulation Amount: Large (67-100%) N/A N/A Granulation Quality: Red N/A N/A Necrotic Amount:  Small (1-33%) N/A N/A Exposed Structures: Fat Layer (Subcutaneous N/A N/A Tissue) Exposed: Yes Fascia: No LADINE, KIPER. ([id number]) Tendon: No Muscle: No Joint: No Bone: No Epithelialization: Small (1-33%) N/A N/A Periwound Skin Texture: Excoriation: No N/A N/A Induration: No Callus: No Crepitus: No Rash: No Scarring: No Periwound Skin Moisture: Maceration: Yes N/A N/A Dry/Scaly: No Periwound Skin Color: Hemosiderin Staining: Yes N/A N/A Atrophie Blanche: No Cyanosis: No Ecchymosis: No Erythema: No Mottled: No Pallor: No Rubor: No Temperature: No Abnormality N/A N/A Tenderness on Palpation: Yes N/A N/A Wound Preparation: Ulcer Cleansing: N/A N/A Rinsed/Irrigated with Saline Topical Anesthetic Applied: Other: lidocaine 4% Treatment Notes Wound #5 (Left, Medial Lower  Leg) Notes Silver alginate, 3-Layer Electronic Signature(s) Signed: 12/10/2018 5:51:47 PM By: Linton Ham MD Entered By: Linton Ham on 12/10/2018 14:18:47 Onder, [name] ([id number]) -------------------------------------------------------------------------------- Multi-Disciplinary Care Plan Details Patient Name: BRITTINEE, [name]. Date of Service: 12/10/2018 1:00 PM Medical Record Number: [id number] Patient Account Number: [id number] Date of Birth/Sex: December 12, 1945 (73 y.o. F) Treating RN: Cornell Barman Primary Care Devetta Hagenow: [name] Other Clinician: Referring Jayvion Stefanski: Referral, Self Treating Lindamarie Maclachlan/Extender: Tito Dine in Treatment: 0 Active Inactive Orientation to the Wound Care Program Nursing Diagnoses: Knowledge deficit related to the wound healing center program Goals: Patient/caregiver will verbalize understanding of the [facility] Date Initiated: 12/10/2018 Target Resolution Date: 01/09/2019 Goal Status: Active Interventions: Provide education on orientation to the wound center Notes: Soft Tissue Infection Nursing Diagnoses: Impaired tissue integrity Goals: Patient's soft tissue infection will resolve Date Initiated: 12/10/2018 Target Resolution Date: 01/09/2019 Goal Status: Active Interventions: Assess signs and symptoms of infection every visit Notes: Venous Leg Ulcer Nursing Diagnoses: Actual venous Insuffiency (use after diagnosis is confirmed) Goals: Patient will maintain optimal edema control Date Initiated: 12/10/2018 Target Resolution Date: 01/09/2019 Goal Status: Active Interventions: Assess peripheral edema status every visit. IEESHA, [name] ([id number]) Treatment Activities: Therapeutic compression applied : 12/10/2018 Notes: Wound/Skin Impairment Nursing Diagnoses: Impaired tissue integrity Goals: Patient/caregiver will verbalize understanding of skin care regimen Date Initiated: 12/10/2018 Target Resolution  Date: 01/09/2019 Goal Status: Active Interventions: Assess ulceration(s) every visit Treatment Activities: Topical wound management initiated : 12/10/2018 Notes: Electronic Signature(s) Signed: 12/10/2018 5:28:11 PM By: [name], [name], RN, CWS, [name], [name] Entered By: [name], [name], RN, CWS, Kim on 12/10/2018 14:07:00 LINDY, GARCZYNSKI ([id number]) -------------------------------------------------------------------------------- Pain Assessment Details Patient Name: ELLOWYN, [name]. Date of Service: 12/10/2018 1:00 PM Medical Record Number: [id number] Patient Account Number: [id number] Date of Birth/Sex: Apr 19, 1946 (73 y.o. F) Treating RN: Cornell Barman Primary Care Kaimani Clayson: [name] Other Clinician: Referring Faline Langer: Referral, Self Treating Jahmya Onofrio/Extender: Tito Dine in Treatment: 0 Active Problems Location of Pain Severity and Description of Pain Patient Has Paino Yes Site Locations Rate the pain. Current Pain Level: 5 Pain Management and Medication Current Pain Management: Electronic Signature(s) Signed: 12/10/2018 4:36:22 PM By: Lorine Bears RCP, RRT, CHT Signed: 12/10/2018 5:28:11 PM By: [name], [name], RN, CWS, [name], [name] Entered By: Lorine Bears on 12/10/2018 13:02:37 SHEREESE, BONNIE ([id number]) -------------------------------------------------------------------------------- Patient/Caregiver Education Details Patient Name: MIKAYA, BUNNER. Date of Service: 12/10/2018 1:00 PM Medical Record Number: [id number] Patient Account Number: [id number] Date of Birth/Gender: 12-16-1945 (73 y.o. F) Treating RN: Cornell Barman Primary Care Physician: [name] Other Clinician: Referring Physician: Referral, Self Treating Physician/Extender: Tito Dine in Treatment: 0 Education Assessment Education Provided To: Patient Education Topics Provided Venous: Handouts: Controlling Swelling with Multilayered Compression Wraps Methods:  Demonstration, Explain/Verbal Responses: State content correctly  Wound/Skin Impairment: Handouts: Caring for Your Ulcer Methods: Demonstration, Explain/Verbal Responses: State content correctly Electronic Signature(s) Signed: 12/11/2018 11:43:02 AM By: Harold Barban Entered By: Harold Barban on 12/10/2018 14:27:07 De[place]er, [name] ([id number]) -------------------------------------------------------------------------------- Wound Assessment Details Patient Name: [name], [name]. Date of Service: 12/10/2018 1:00 PM Medical Record Number: [id number] Patient Account Number: [id number] Date of Birth/Sex: May 30, 1946 (73 y.o. F) Treating RN: Montey Hora Primary Care Johnmichael Melhorn: [name] Other Clinician: Referring Lynell Kussman: Referral, Self Treating Juleen Sorrels/Extender: Tito Dine in Treatment: 0 Wound Status Wound Number: 5 Primary Lymphedema Etiology: Wound Location: Left Lower Leg - Medial Wound Open Wounding Event: Gradually Appeared Status: Date Acquired: 11/19/2018 Comorbid Cataracts, Asthma, Sleep Apnea, Deep Vein Weeks Of Treatment: 0 History: Thrombosis, Hypertension, Peripheral Venous Clustered Wound: No Disease, Osteoarthritis, Received Chemotherapy, Received Radiation Photos Photo Uploaded By: Montey Hora on 12/10/2018 13:37:46 Wound Measurements Length: (cm) 3.4 Width: (cm) 3.2 Depth: (cm) 0.1 Area: (cm) 8.545 Volume: (cm) 0.855 % Reduction in Area: % Reduction in Volume: Epithelialization: Small (1-33%) Tunneling: No Undermining: No Wound Description Full Thickness Without Exposed Support Classification: Structures Wound Margin: Indistinct, nonvisible Exudate Medium Amount: Exudate Type: Serosanguineous Exudate Color: red, brown Foul Odor After Cleansing: No Slough/Fibrino Yes Wound Bed Granulation Amount: Large (67-100%) Exposed Structure Granulation Quality: Red Fascia Exposed: No Necrotic Amount: Small (1-33%) Fat Layer  (Subcutaneous Tissue) Exposed: Yes Necrotic Quality: Adherent Slough Tendon Exposed: No Muscle Exposed: No Joint Exposed: No MAYLANI, [name]. ([id number]) Bone Exposed: No Periwound Skin Texture Texture Color No Abnormalities Noted: No No Abnormalities Noted: No Callus: No Atrophie Blanche: No Crepitus: No Cyanosis: No Excoriation: No Ecchymosis: No Induration: No Erythema: No Rash: No Hemosiderin Staining: Yes Scarring: No Mottled: No Pallor: No Moisture Rubor: No No Abnormalities Noted: No Dry / Scaly: No Temperature / Pain Maceration: Yes Temperature: No Abnormality Tenderness on Palpation: Yes Wound Preparation Ulcer Cleansing: Rinsed/Irrigated with Saline Topical Anesthetic Applied: Other: lidocaine 4%, Treatment Notes Wound #5 (Left, Medial Lower Leg) Notes Silver alginate, 3-Layer Electronic Signature(s) Signed: 12/10/2018 5:48:53 PM By: Montey Hora Entered By: Montey Hora on 12/10/2018 13:23:11 Fazekas, [name] ([id number]) -------------------------------------------------------------------------------- Vitals Details Patient Name: TISHEENA, MAGUIRE. Date of Service: 12/10/2018 1:00 PM Medical Record Number: [id number] Patient Account Number: [id number] Date of Birth/Sex: [birth year] (73 y.o. F) Treating RN: Cornell Barman Primary Care Jacarius Handel: [name] Other Clinician: Referring Ayrianna [name]: Referral, Self Treating Cam Harnden/Extender: Tito Dine in Treatment: 0 Vital Signs Time Taken: 13:02 Temperature (F): 98.1 Height (in): 63 Pulse (bpm): 64 Source: Stated Respiratory Rate (breaths/min): 16 Weight (lbs): 224.7 Blood Pressure (mmHg): 144/66 Source: Measured Reference Range: 80 - 120 mg / dl Body Mass Index (BMI): 39.8 Airway Electronic Signature(s) Signed: 12/10/2018 4:36:22 PM By: Lorine Bears RCP, RRT, CHT Entered By: Lorine Bears on 12/10/2018 13:04:39

## 2018-12-15 DIAGNOSIS — L97211 Non-pressure chronic ulcer of right calf limited to breakdown of skin: Secondary | ICD-10-CM | POA: Diagnosis not present

## 2018-12-15 DIAGNOSIS — I10 Essential (primary) hypertension: Secondary | ICD-10-CM | POA: Diagnosis not present

## 2018-12-15 DIAGNOSIS — J45909 Unspecified asthma, uncomplicated: Secondary | ICD-10-CM | POA: Diagnosis not present

## 2018-12-15 DIAGNOSIS — E876 Hypokalemia: Secondary | ICD-10-CM | POA: Diagnosis not present

## 2018-12-15 DIAGNOSIS — E11622 Type 2 diabetes mellitus with other skin ulcer: Secondary | ICD-10-CM | POA: Diagnosis not present

## 2018-12-15 DIAGNOSIS — G4733 Obstructive sleep apnea (adult) (pediatric): Secondary | ICD-10-CM | POA: Diagnosis not present

## 2018-12-16 NOTE — Progress Notes (Signed)
LEETA, GRIMME ([id number]) Visit Report for 12/15/2018 Arrival Information Details Patient Name: [name], [name]. Date of Service: 12/15/2018 2:00 PM Medical Record Number: [id number] Patient Account Number: [id number] Date of Birth/Sex: 05/02/1946 (73 y.o. F) Treating RN: Army Melia Primary Care Madalynne Gutmann: [name] Other Clinician: Referring Latrece Nitta: [name] Treating Brizza Nathanson/Extender: Melburn Hake, HOYT Weeks in Treatment: 0 Visit Information History Since Last Visit All ordered tests and consults were completed: Yes Patient Arrived: Ambulatory Added or deleted any medications: No Arrival Time: 14:26 Any new allergies or adverse reactions: No Accompanied By: self Had a fall or experienced change in No Transfer Assistance: None activities of daily living that may affect Patient Identification Verified: Yes risk of falls: Signs or symptoms of abuse/neglect since last visito No Hospitalized since last visit: No Has Dressing in Place as Prescribed: No Has Compression in Place as Prescribed: No Pain Present Now: Yes Electronic Signature(s) Signed: 12/15/2018 4:51:21 PM By: Army Melia Entered By: Army Melia on 12/15/2018 14:26:52 McNair, [name] ([id number]) -------------------------------------------------------------------------------- Compression Therapy Details Patient Name: [name], [name]. Date of Service: 12/15/2018 2:00 PM Medical Record Number: [id number] Patient Account Number: [id number] Date of Birth/Sex: [birth year] (73 y.o. F) Treating RN: Army Melia Primary Care Saraiyah Hemminger: [name] Other Clinician: Referring Jamael Hoffmann: [name] Treating Miral Hoopes/Extender: STONE III, HOYT Weeks in Treatment: 0 Compression Therapy Performed for Wound Assessment: Wound #5 Left,Medial Lower Leg Performed By: Clinician Army Melia, RN Compression Type: Three Layer Pre Treatment ABI: 1 Electronic Signature(s) Signed: 12/15/2018 4:51:21 PM By: Army Melia Entered By: Army Melia on 12/15/2018 14:48:19 [place], [name] ([id number]) -------------------------------------------------------------------------------- Encounter Discharge Information Details Patient Name: [name], [name]. Date of Service: 12/15/2018 2:00 PM Medical Record Number: [id number] Patient Account Number: [id number] Date of Birth/Sex: 1946/06/30 (73 y.o. F) Treating RN: Army Melia Primary Care Manvir Prabhu: [name] Other Clinician: Referring Lorrin Nawrot: [name] Treating Cicero Noy/Extender: Melburn Hake, HOYT Weeks in Treatment: 0 Encounter Discharge Information Items Discharge Condition: Stable Ambulatory Status: Ambulatory Discharge Destination: Home Transportation: Private Auto Accompanied By: self Schedule Follow-up Appointment: Yes Clinical Summary of Care: Electronic Signature(s) Signed: 12/15/2018 4:51:21 PM By: Army Melia Entered By: Army Melia on 12/15/2018 14:49:24 Eustache, [name] ([id number]) -------------------------------------------------------------------------------- Patient/Caregiver Education Details Patient Name: [name], [name]. Date of Service: 12/15/2018 2:00 PM Medical Record Number: [id number] Patient Account Number: [id number] Date of Birth/Gender: [birth year] (73 y.o. F) Treating RN: Army Melia Primary Care Physician: [name] Other Clinician: Referring Physician: [name] Treating Physician/Extender: Sharalyn Ink in Treatment: 0 Education Assessment Education Provided To: Patient Education Topics Provided Venous: Handouts: Controlling Swelling with Multilayered Compression Wraps Electronic Signature(s) Signed: 12/15/2018 4:51:21 PM By: Army Melia Entered By: Army Melia on 12/15/2018 [place], [place]. ([id number]) -------------------------------------------------------------------------------- Wound Assessment Details Patient Name: [name], [name]. Date of Service: 12/15/2018  2:00 PM Medical Record Number: [id number] Patient Account Number: [id number] Date of Birth/Sex: 07-Dec-1945 (73 y.o. F) Treating RN: Army Melia Primary Care Leeah Politano: [name] Other Clinician: Referring Emma-Lee Oddo: [name] Treating Afshin Chrystal/Extender: STONE III, HOYT Weeks in Treatment: 0 Wound Status Wound Number: 5 Primary Lymphedema Etiology: Wound Location: Left Lower Leg - Medial Wound Open Wounding Event: Gradually Appeared Status: Date Acquired: 11/19/2018 Comorbid Cataracts, Asthma, Sleep Apnea, Deep Vein Weeks Of Treatment: 0 History: Thrombosis, Hypertension, Peripheral Venous Clustered Wound: No Disease, Osteoarthritis, Received Chemotherapy, Received Radiation Wound Measurements Length: (cm) 3.4 Width: (cm) 4.2 Depth: (cm) 0.1 Area: (cm) 11.215 Volume: (cm) 1.122 % Reduction in Area: -31.2% % Reduction in Volume: -31.2% Epithelialization:  Small (1-33%) Tunneling: No Undermining: No Wound Description Full Thickness Without Exposed Support Classification: Structures Wound Margin: Indistinct, nonvisible Exudate Medium Amount: Exudate Type: Serosanguineous Exudate Color: red, brown Foul Odor After Cleansing: No Slough/Fibrino Yes Wound Bed Granulation Amount: Large (67-100%) Exposed Structure Granulation Quality: Red Fascia Exposed: No Necrotic Amount: Small (1-33%) Fat Layer (Subcutaneous Tissue) Exposed: Yes Necrotic Quality: Adherent Slough Tendon Exposed: No Muscle Exposed: No Joint Exposed: No Bone Exposed: No Periwound Skin Texture Texture Color No Abnormalities Noted: No No Abnormalities Noted: No Callus: No Atrophie Blanche: No Crepitus: No Cyanosis: No Excoriation: No Ecchymosis: No Induration: No Erythema: No Rash: No Hemosiderin Staining: Yes Scarring: No Mottled: No Pallor: No Moisture Rubor: No No Abnormalities Noted: No [name], [name] ([id number]) Dry / Scaly: No Temperature / Pain Maceration:  Yes Temperature: No Abnormality Tenderness on Palpation: Yes Wound Preparation Ulcer Cleansing: Rinsed/Irrigated with Saline Topical Anesthetic Applied: None Treatment Notes Wound #5 (Left, Medial Lower Leg) Notes Silver alginate, 3-Layer Electronic Signature(s) Signed: 12/15/2018 4:51:21 PM By: Army Melia Entered By: Army Melia on 12/15/2018 14:32:41

## 2018-12-17 ENCOUNTER — Encounter: Payer: Medicare Other | Admitting: Internal Medicine

## 2018-12-17 ENCOUNTER — Telehealth: Payer: Self-pay | Admitting: Family Medicine

## 2018-12-17 DIAGNOSIS — G4733 Obstructive sleep apnea (adult) (pediatric): Secondary | ICD-10-CM | POA: Diagnosis not present

## 2018-12-17 DIAGNOSIS — I10 Essential (primary) hypertension: Secondary | ICD-10-CM | POA: Diagnosis not present

## 2018-12-17 DIAGNOSIS — L97211 Non-pressure chronic ulcer of right calf limited to breakdown of skin: Secondary | ICD-10-CM | POA: Diagnosis not present

## 2018-12-17 DIAGNOSIS — E876 Hypokalemia: Secondary | ICD-10-CM | POA: Diagnosis not present

## 2018-12-17 DIAGNOSIS — L97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d: Secondary | ICD-10-CM | POA: Diagnosis not present

## 2018-12-17 DIAGNOSIS — J45909 Unspecified asthma, uncomplicated: Secondary | ICD-10-CM | POA: Diagnosis not present

## 2018-12-17 DIAGNOSIS — E11622 Type 2 diabetes mellitus with other skin ulcer: Secondary | ICD-10-CM | POA: Diagnosis not present

## 2018-12-17 NOTE — Telephone Encounter (Signed)
Mailed copy of VAS [place] of lower extremity to pt as requested.

## 2018-12-17 NOTE — Telephone Encounter (Signed)
Pt needs a copy of her vas study she had done 08/01/18  Please mail to her   Best number [phone]

## 2018-12-19 DIAGNOSIS — E876 Hypokalemia: Secondary | ICD-10-CM | POA: Diagnosis not present

## 2018-12-19 DIAGNOSIS — G4733 Obstructive sleep apnea (adult) (pediatric): Secondary | ICD-10-CM | POA: Diagnosis not present

## 2018-12-19 DIAGNOSIS — E11622 Type 2 diabetes mellitus with other skin ulcer: Secondary | ICD-10-CM | POA: Diagnosis not present

## 2018-12-19 DIAGNOSIS — I10 Essential (primary) hypertension: Secondary | ICD-10-CM | POA: Diagnosis not present

## 2018-12-19 DIAGNOSIS — L97211 Non-pressure chronic ulcer of right calf limited to breakdown of skin: Secondary | ICD-10-CM | POA: Diagnosis not present

## 2018-12-19 DIAGNOSIS — J45909 Unspecified asthma, uncomplicated: Secondary | ICD-10-CM | POA: Diagnosis not present

## 2018-12-19 NOTE — Progress Notes (Signed)
RAVONDA, BRECHEEN ([id number]) Visit Report for 12/17/2018 HPI Details Patient Name: [name], [name]. Date of Service: 12/17/2018 1:15 PM Medical Record Number: [id number] Patient Account Number: [id number] Date of Birth/Sex: 1946/02/05 (73 y.o. F) Treating RN: Cornell Barman Primary Care Provider: [name] Other Clinician: Referring Provider: [name] Treating Provider/Extender: Tito Dine in Treatment: 1 History of Present Illness HPI Description: Pleasant 73 year old with history of chronic venous insufficiency. No diabetes or peripheral vascular disease. Left ABI 1.29. Questionable history of left lower extremity DVT. She developed a recurrent ulceration on her left lateral calf in December 2015, which she attributes to poor diet and subsequent lower extremity edema. She underwent endovenous laser ablation of her left greater saphenous vein in 2010. She underwent laser ablation of accessory branch of left GSV in April 2016 by Dr. Kellie Simmering at [facility]. She was previously wearing Unna boots, which she tolerated well. Tolerating 2 layer compression and cadexomer iodine. She returns to clinic for follow-up and is without new complaints. She denies any significant pain at this time. She reports persistent pain with pressure. No claudication or ischemic rest pain. No fever or chills. No drainage. READMISSION 11/13/16; this is a 73 year old woman who is not a diabetic. She is here for a review of a painful area on her left medial lower extremity. I note that she was seen here previously last year for wound I believe to be in the same area. At that time she had undergone previously a left greater saphenous vein ablation by Dr. Kellie Simmering and she had a ablation of the anterior accessory branch of the left greater saphenous vein in March 2016. Seeing that the wound actually closed over. In reviewing the history with her today the ulcer in this area has been recurrent. She describes  a biopsy of this area in 2009 that only showed stasis physiology. 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 Do not see that she has ever had arterial studies in the left leg. She tells me she has a follow-up with Dr. Kellie Simmering in roughly 10 days In any case she developed the reopening of this area roughly a month ago. On the background of this she describes rapidly increasing edema which has responded to Lasix 40 mg and metolazone 2.5 mg as well as the patient's lymph massage. She has been told she has both venous insufficiency and lymphedema but she cannot tolerate compression stockings 11/28/16; the patient saw Dr. Kellie Simmering recently. Per the patient he did arterial Dopplers in the office that did not show evidence of arterial insufficiency, per the patient he stated "treat this like an ordinary venous ulcer". She also saw her dermatologist Dr. Ronnald Ramp who felt that this was more of a vascular ulcer. In general things are improving although she arrives today with increasing bilateral lower extremity edema with weeping a deeper fluid through the wound on the left medial leg compatible with some degree of lymphedema 12/04/16; the patient's wound is fully epithelialized but I don't think fully healed. We will do another week of depression with Promogran and TCA however I suspect we'll be able to discharge her next week. This is a very unusual-looking wound which was initially a figure-of-eight type wound lying on its side surrounded by petechial like hemorrhage. She  has had venous ablation on this side. She apparently does not have an arterial issue per Dr. Kellie Simmering. She saw her dermatologist thought it was "vascular". Patient is definitely going to need ongoing compression and I talked about  this with her today she will go to elastic therapy after she leaves here next week 12/11/16; the patient's wound is not completely closed today. She has surrounding scar tissue and in further discussion with the patient it would appear that she had ulcers in this area in 2009 for a prolonged period of time ultimately requiring a punch biopsy of this area that only showed venous insufficiency. I did not previously pickup on this part of the history from the patient. 12/18/16; the patient's wound is completely epithelialized. There is no open area here. She has significant bilateral venous insufficiency with secondary lymphedema to a mild-to-moderate degree she does not have compression stockings.. She did not say anything to me when I was in the room, she told our intake nurse that she was still having pain in this area. This isn't TONY, SCHURIG ([id number]) unusual recurrent small open area. She is going to go to elastic therapy to obtain compression stockings. 12/25/16; the patient's wound is fully epithelialized. There is no open area here. The patient describes some continued episodic discomfort in this area medial left calf. However everything looks fine and healed here. She is been to elastic therapy and caught herself 15-20 mmHg stockings, they apparently were having trouble getting 20-30 mm stockings in her size 01/22/17; this is a patient we discharged from the clinic a month ago. She has a recurrent open wound on her medial left calf. She had 15 mm support stockings. I told her I thought she needed 20-30 mm compression stockings. She tells me that she has been ill with hospitalization secondary to asthma and is been found to have severe hypokalemia likely secondary to a combination of Lasix and metolazone. This morning she noted blistering and leaking fluid on the posterior part of her left leg. She called our intake nurse urgently and we was saw her this afternoon. She has not had any real  discomfort here. I don't know that she's been wearing any stockings on this leg for at least 2-3 days. ABIs in this clinic were 1.21 on the right and 1.3 on the left. She is previously seen vascular surgery who does not think that there is a peripheral arterial issue. 01/30/17; Patient arrives with no open wound on the left leg. She has been to elastic therapy and obtained 20-2mmhg below knee stockings and she has one on the right leg today. READMISSION 02/19/18; this Ririe is a now 73 year old patient we've had in this clinic perhaps 3 times before. I had last looked at her from January 07 December 2016 with an area on the medial left leg. We discharged her on 12/25/16 however she had to be readmitted on 01/22/17 with a recurrence. I have in my notes that we discharged her on 20-30 mm stockings although she tells me she was only wearing support hose because she cannot get stockings on predominantly related to her cervical spine surgery/issues. She has had previous ablations done by vein and vascular in [place] including a great saphenous vein ablation on the left with an anterior accessory branch ablation I think both of these were in 2016. On one of the previous visit she had a biopsy noted 2009 that was negative. She is not felt to have an arterial issue. She is not  a diabetic. She does have a history of obstructive sleep apnea hypertension asthma as well as chronic venous insufficiency and lymphedema. On this occasion she noted 2 dry scaly patch on her left leg. She tried to put lotion on this it didn't really help. There were 2 open areas.the patient has been seeing her primary physician from 02/05/18 through 02/14/18. She had Unna boots applied. The superior wound now on the lateral left leg has closed but she's had one wound that remains open on the lateral left leg. This is not the same spot as we dealt with in 2018. ABIs in this clinic were 1.3 bilaterally 02/26/18; patient has a small wound  on the left lateral calf. Dimensions are down. She has chronic venous insufficiency and lymphedema. 03/05/18; small open area on the left lateral calf. Dimensions are down. Tightly adherent necrotic debris over the surface of the wound which was difficult to remove. Also the dressing [over collagen] stuck to the wound surface. This was removed with some difficulty as well. Change the primary dressing to Hydrofera Blue ready 03/12/18; small open area on the left lateral calf. Comes in with tightly adherent surface eschar as well as some adherent Hydrofera Blue. 03/19/18; open area on the left lateral calf. Again adherent surface eschar as well as some adherent Hydrofera Blue nonviable subcutaneous tissue. She complained of pain all week even with the reduction from 4-3 layer compression I put on last week. Also she had an increase in her ankle and calf measurements probably related to the same thing. 03/26/18; open area on the left lateral calf. A very small open area remains here. We used silver alginate starting last week as the Hydrofera Blue seem to stick to the wound bed. In using 4-layer compression 04/02/18; the open area in the left lateral calf at some adherent slough which I removed there is no open area here. We are able to transition her into her own compression stocking. Truthfully I think this is probably his support hose. However this does not maintain skin integrity will be limited. She cannot put over the toe compression stockings on because of neck problems hand problems etc. She is allergic to the lining layer of juxta lites. We might be forced to use extremitease stocking should this fail READMIT 11/24/2018 Patient is now a 73 year old woman who is not a diabetic. She has been in this clinic on at least 3 previous occasions largely with recurrent wounds on her left leg secondary to chronic venous insufficiency with secondary lymphedema. Her situation is complicated by inability to get  stockings on and an allergy to neoprene which is apparently a component and at least juxta lites and other stockings. As a result she really has not been wearing any stockings on her legs. She tells [place] that roughly 2 or 3 weeks ago she started noticing a stinging sensation just above her ankle on the left medial aspect. She has been diagnosed with pseudogout and she wondered whether this was what she was experiencing. She tried to dress this with something she bought at the store however subsequently it pulled skin off and now she has an open wound that is not improving. She has been using Vaseline gauze with a cover bandage. She saw her primary doctor last week who MARILIN, GALLETTI ([id number]) put an Unna boot on her. ABIs in this clinic was 1.03 on the left 2/12; the area is on the left medial ankle. Odd-looking wound with what looks to be surface epithelialization  but a multitude of small petechial openings. This clearly not closed yet. We have been using silver alginate under 3 layer compression with TCA Electronic Signature(s) Signed: 12/17/2018 6:19:03 PM By: Linton Ham MD Entered By: Linton Ham on 12/17/2018 15:46:13 Ebanks, [name] ([id number]) -------------------------------------------------------------------------------- Physical Exam Details Patient Name: DOMINIC, RHOME. Date of Service: 12/17/2018 1:15 PM Medical Record Number: [id number] Patient Account Number: [id number] Date of Birth/Sex: [birth year] (73 y.o. F) Treating RN: Cornell Barman Primary Care Provider: [name] Other Clinician: Referring Provider: [name] Treating Provider/Extender: Tito Dine in Treatment: 1 Constitutional Patient is hypertensive.. Pulse regular and within target range for patient.Marland Kitchen Respirations regular, non-labored and within target range.. Temperature is normal and within the target range for the patient.Marland Kitchen appears in no distress. Respiratory Respiratory  effort is easy and symmetric bilaterally. Rate is normal at rest and on room air.. Cardiovascular Pedal pulses palpable and strong bilaterally.. Integumentary (Hair, Skin) Skin and subcutaneous tissue without rashes, lesions, discoloration or ulcers. No evidence of fungal disease. Hair and skin color texture normal and healthy in appearance.Marland Kitchen Psychiatric No evidence of depression, anxiety, or agitation. Calm, cooperative, and communicative. Appropriate interactions and affect.. Notes Wound exam; the patient has the same wound. The epithelialization is present but there is still open areas. No surrounding erythema Electronic Signature(s) Signed: 12/17/2018 6:19:03 PM By: Linton Ham MD Entered By: Linton Ham on 12/17/2018 15:48:42 [name] ([id number]) -------------------------------------------------------------------------------- Physician Orders Details Patient Name: NOLLIE, SHIFLETT. Date of Service: 12/17/2018 1:15 PM Medical Record Number: [id number] Patient Account Number: [id number] Date of Birth/Sex: September 04, 1946 (73 y.o. F) Treating RN: Cornell Barman Primary Care Provider: [name] Other Clinician: Referring Provider: [name] Treating Provider/Extender: Tito Dine in Treatment: 1 Verbal / Phone Orders: No Diagnosis Coding Wound Cleansing Wound #5 Left,Medial Lower Leg o Clean wound with Normal Saline. o May Shower, gently pat wound dry prior to applying new dressing. Anesthetic (add to Medication List) Wound #5 Left,Medial Lower Leg o Topical Lidocaine 4% cream applied to wound bed prior to debridement (In Clinic Only). Skin Barriers/Peri-Wound Care Wound #5 Left,Medial Lower Leg o Triamcinolone Acetonide Ointment (TCA) Primary Wound Dressing Wound #5 Left,Medial Lower Leg o Silver Alginate Secondary Dressing Wound #5 Left,Medial Lower Leg o ABD pad Dressing Change Frequency Wound #5 Left,Medial Lower Leg o  Change dressing every week Follow-up Appointments Wound #5 Left,Medial Lower Leg o Return Appointment in 1 week. o Nurse Visit as needed Edema Control Wound #5 Left,Medial Lower Leg o 3 Layer Compression System - Left Lower Extremity Electronic Signature(s) Signed: 12/17/2018 6:19:03 PM By: Linton Ham MD Signed: 12/18/2018 8:43:41 AM By: [name], [name], RN, CWS, [name], [name] Entered By: [name], [name], RN, CWS, Kim on 12/17/2018 13:46:11 CHAVONNE, [name] ([id number], COPPERNOLL ([id number]) -------------------------------------------------------------------------------- Problem List Details Patient Name: JENINE, KRISHER. Date of Service: 12/17/2018 1:15 PM Medical Record Number: [id number] Patient Account Number: [id number] Date of Birth/Sex: [birth year] (73 y.o. F) Treating RN: Cornell Barman Primary Care Provider: [name] Other Clinician: Referring Provider: [name] Treating Provider/Extender: Tito Dine in Treatment: 1 Active Problems ICD-10 Evaluated Encounter Code Description Active Date Today Diagnosis L97.211 Non-pressure chronic ulcer of right calf limited to breakdown 12/10/2018 No Yes of skin I87.321 Chronic venous hypertension (idiopathic) with inflammation of 12/10/2018 No Yes right lower extremity I89.0 Lymphedema, not elsewhere classified 12/10/2018 No Yes Inactive Problems Resolved Problems Electronic Signature(s) Signed: 12/17/2018 6:19:03 PM By: Linton Ham MD Entered By: Linton Ham on 12/17/2018  15:44:55 ZAKKIYYA, BARNO ([id number]) -------------------------------------------------------------------------------- Progress Note Details Patient Name: DARNISE, MONTAG. Date of Service: 12/17/2018 1:15 PM Medical Record Number: [id number] Patient Account Number: [id number] Date of Birth/Sex: November 01, 1946 (73 y.o. F) Treating RN: Cornell Barman Primary Care Provider: [name] Other Clinician: Referring Provider: [name] Treating Provider/Extender: Tito Dine in Treatment: 1 Subjective History of Present Illness (HPI) Pleasant 73 year old with history of chronic venous insufficiency. No diabetes or peripheral vascular disease. Left ABI 1.29. Questionable history of left lower extremity DVT. She developed a recurrent ulceration on her left lateral calf in December 2015, which she attributes to poor diet and subsequent lower extremity edema. She underwent endovenous laser ablation of her left greater saphenous vein in 2010. She underwent laser ablation of accessory branch of left GSV in April 2016 by Dr. Kellie Simmering at [facility]. She was previously wearing Unna boots, which she tolerated well. Tolerating 2 layer compression and cadexomer iodine. She returns to clinic for follow-up and is without new complaints. She denies any significant pain at this time. She reports persistent pain with pressure. No claudication or ischemic rest pain. No fever or chills. No drainage. READMISSION 11/13/16; this is a 73 year old woman who is not a diabetic. She is here for a review of a painful area on her left medial lower extremity. I note that she was seen here previously last year for wound I believe to be in the same area. At that time she had undergone previously a left greater saphenous vein ablation by Dr. Kellie Simmering and she had a ablation of the anterior accessory branch of the left greater saphenous vein in March 2016. Seeing that the wound actually closed over. In reviewing the history with her today the ulcer in this area has been recurrent. She describes a biopsy of this area in 2009 that only showed stasis physiology. 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 Do not see that she has ever  had arterial studies in the left leg. She tells me she has a follow-up with Dr. Kellie Simmering in roughly 10 days In any case she developed the reopening of this area roughly a month ago. On the background of this she describes rapidly increasing edema which has responded to Lasix 40 mg and metolazone 2.5 mg as well as the patient's lymph massage. She has been told she has both venous insufficiency and lymphedema but she cannot tolerate compression stockings 11/28/16; the patient saw Dr. Kellie Simmering recently. Per the patient he did arterial Dopplers in the office that did not show evidence of arterial insufficiency, per the patient he stated "treat this like an ordinary venous ulcer". She also saw her dermatologist Dr. Ronnald Ramp who felt that this was more of a vascular ulcer. In general things are improving although she arrives today with increasing bilateral lower extremity edema with weeping a deeper fluid through the wound on the left medial leg compatible with some degree of lymphedema 12/04/16; the patient's wound is fully epithelialized but I don't think fully healed. We will do another week of depression with Promogran and TCA however I suspect we'll be able to discharge her next week. This is a very unusual-looking wound which was initially a figure-of-eight type wound lying on its side surrounded by petechial like hemorrhage. She  has had venous ablation on this side. She apparently does not have an arterial issue per Dr. Kellie Simmering. She saw her dermatologist thought it was "vascular". Patient is definitely going to need ongoing compression and I talked about this with her today she will go to elastic therapy after she leaves here next week 12/11/16; the patient's wound is not completely closed today. She has surrounding scar tissue and in further discussion with the patient it would appear that she had ulcers in this area in 2009 for a prolonged period of time ultimately requiring a punch biopsy of this area that  only showed venous insufficiency. I did not previously pickup on this part of the history from the patient. 12/18/16; the patient's wound is completely epithelialized. There is no open area here. She has significant bilateral venous insufficiency with secondary lymphedema to a mild-to-moderate degree she does not have compression stockings.. She did not say anything to me when I was in the room, she told our intake nurse that she was still having pain in this area. This isn't unusual recurrent small open area. She is going to go to elastic therapy to obtain compression stockings. 12/25/16; the patient's wound is fully epithelialized. There is no open area here. The patient describes some continued episodic discomfort in this area medial left calf. However everything looks fine and healed here. She is been to elastic therapy and REBEKAH, ZACKERY ([id number]) caught herself 15-20 mmHg stockings, they apparently were having trouble getting 20-30 mm stockings in her size 01/22/17; this is a patient we discharged from the clinic a month ago. She has a recurrent open wound on her medial left calf. She had 15 mm support stockings. I told her I thought she needed 20-30 mm compression stockings. She tells me that she has been ill with hospitalization secondary to asthma and is been found to have severe hypokalemia likely secondary to a combination of Lasix and metolazone. This morning she noted blistering and leaking fluid on the posterior part of her left leg. She called our intake nurse urgently and we was saw her this afternoon. She has not had any real discomfort here. I don't know that she's been wearing any stockings on this leg for at least 2-3 days. ABIs in this clinic were 1.21 on the right and 1.3 on the left. She is previously seen vascular surgery who does not think that there is a peripheral arterial issue. 01/30/17; Patient arrives with no open wound on the left leg. She has been to elastic therapy  and obtained 20-48mmhg below knee stockings and she has one on the right leg today. READMISSION 02/19/18; this Vallie is a now 73 year old patient we've had in this clinic perhaps 3 times before. I had last looked at her from January 07 December 2016 with an area on the medial left leg. We discharged her on 12/25/16 however she had to be readmitted on 01/22/17 with a recurrence. I have in my notes that we discharged her on 20-30 mm stockings although she tells me she was only wearing support hose because she cannot get stockings on predominantly related to her cervical spine surgery/issues. She has had previous ablations done by vein and vascular in [place] including a great saphenous vein ablation on the left with an anterior accessory branch ablation I think both of these were in 2016. On one of the previous visit she had a biopsy noted 2009 that was negative. She is not felt to have an arterial issue. She is not  a diabetic. She does have a history of obstructive sleep apnea hypertension asthma as well as chronic venous insufficiency and lymphedema. On this occasion she noted 2 dry scaly patch on her left leg. She tried to put lotion on this it didn't really help. There were 2 open areas.the patient has been seeing her primary physician from 02/05/18 through 02/14/18. She had Unna boots applied. The superior wound now on the lateral left leg has closed but she's had one wound that remains open on the lateral left leg. This is not the same spot as we dealt with in 2018. ABIs in this clinic were 1.3 bilaterally 02/26/18; patient has a small wound on the left lateral calf. Dimensions are down. She has chronic venous insufficiency and lymphedema. 03/05/18; small open area on the left lateral calf. Dimensions are down. Tightly adherent necrotic debris over the surface of the wound which was difficult to remove. Also the dressing [over collagen] stuck to the wound surface. This was removed with some  difficulty as well. Change the primary dressing to Hydrofera Blue ready 03/12/18; small open area on the left lateral calf. Comes in with tightly adherent surface eschar as well as some adherent Hydrofera Blue. 03/19/18; open area on the left lateral calf. Again adherent surface eschar as well as some adherent Hydrofera Blue nonviable subcutaneous tissue. She complained of pain all week even with the reduction from 4-3 layer compression I put on last week. Also she had an increase in her ankle and calf measurements probably related to the same thing. 03/26/18; open area on the left lateral calf. A very small open area remains here. We used silver alginate starting last week as the Hydrofera Blue seem to stick to the wound bed. In using 4-layer compression 04/02/18; the open area in the left lateral calf at some adherent slough which I removed there is no open area here. We are able to transition her into her own compression stocking. Truthfully I think this is probably his support hose. However this does not maintain skin integrity will be limited. She cannot put over the toe compression stockings on because of neck problems hand problems etc. She is allergic to the lining layer of juxta lites. We might be forced to use extremitease stocking should this fail READMIT 11/24/2018 Patient is now a 73 year old woman who is not a diabetic. She has been in this clinic on at least 3 previous occasions largely with recurrent wounds on her left leg secondary to chronic venous insufficiency with secondary lymphedema. Her situation is complicated by inability to get stockings on and an allergy to neoprene which is apparently a component and at least juxta lites and other stockings. As a result she really has not been wearing any stockings on her legs. She tells [place] that roughly 2 or 3 weeks ago she started noticing a stinging sensation just above her ankle on the left medial aspect. She has been diagnosed with  pseudogout and she wondered whether this was what she was experiencing. She tried to dress this with something she bought at the store however subsequently it pulled skin off and now she has an open wound that is not improving. She has been using Vaseline gauze with a cover bandage. She saw her primary doctor last week who put an Haematologist on her. ABIs in this clinic was 1.03 on the left ABBIEGAIL, LANDGREN. ([id number]) 2/12; the area is on the left medial ankle. Odd-looking wound with what looks to be surface epithelialization  but a multitude of small petechial openings. This clearly not closed yet. We have been using silver alginate under 3 layer compression with TCA Objective Constitutional Patient is hypertensive.. Pulse regular and within target range for patient.Marland Kitchen Respirations regular, non-labored and within target range.. Temperature is normal and within the target range for the patient.Marland Kitchen appears in no distress. Vitals Time Taken: 1:28 PM, Height: 63 in, Weight: 224.7 lbs, BMI: 39.8, Temperature: 98.3 F, Pulse: 65 bpm, Respiratory Rate: 16 breaths/min, Blood Pressure: 147/83 mmHg. Respiratory Respiratory effort is easy and symmetric bilaterally. Rate is normal at rest and on room air.. Cardiovascular Pedal pulses palpable and strong bilaterally.Marland Kitchen Psychiatric No evidence of depression, anxiety, or agitation. Calm, cooperative, and communicative. Appropriate interactions and affect.. General Notes: Wound exam; the patient has the same wound. The epithelialization is present but there is still open areas. No surrounding erythema Integumentary (Hair, Skin) Skin and subcutaneous tissue without rashes, lesions, discoloration or ulcers. No evidence of fungal disease. Hair and skin color texture normal and healthy in appearance.. Wound #5 status is Open. Original cause of wound was Gradually Appeared. The wound is located on the Left,Medial Lower Leg. The wound measures 3.4cm length x 3.3cm  width x 0.1cm depth; 8.812cm^2 area and 0.881cm^3 volume. There is Fat Layer (Subcutaneous Tissue) Exposed exposed. There is no tunneling or undermining noted. There is a medium amount of serosanguineous drainage noted. The wound margin is indistinct and nonvisible. There is medium (34-66%) red, pink, hyper - granulation within the wound bed. There is a medium (34-66%) amount of necrotic tissue within the wound bed including Eschar and Adherent Slough. The periwound skin appearance exhibited: Maceration, Hemosiderin Staining. The periwound skin appearance did not exhibit: Callus, Crepitus, Excoriation, Induration, Rash, Scarring, Dry/Scaly, Atrophie Blanche, Cyanosis, Ecchymosis, Mottled, Pallor, Rubor, Erythema. Periwound temperature was noted as No Abnormality. The periwound has tenderness on palpation. Assessment Active Problems ICD-10 Non-pressure chronic ulcer of right calf limited to breakdown of skin NADEZHDA, POLLITT ([id number]) Chronic venous hypertension (idiopathic) with inflammation of right lower extremity Lymphedema, not elsewhere classified Diagnoses ICD-10 L97.211: Non-pressure chronic ulcer of right calf limited to breakdown of skin I87.321: Chronic venous hypertension (idiopathic) with inflammation of right lower extremity I89.0: Lymphedema, not elsewhere classified Plan Wound Cleansing: Wound #5 Left,Medial Lower Leg: Clean wound with Normal Saline. May Shower, gently pat wound dry prior to applying new dressing. Anesthetic (add to Medication List): Wound #5 Left,Medial Lower Leg: Topical Lidocaine 4% cream applied to wound bed prior to debridement (In Clinic Only). Skin Barriers/Peri-Wound Care: Wound #5 Left,Medial Lower Leg: Triamcinolone Acetonide Ointment (TCA) Primary Wound Dressing: Wound #5 Left,Medial Lower Leg: Silver Alginate Secondary Dressing: Wound #5 Left,Medial Lower Leg: ABD pad Dressing Change Frequency: Wound #5 Left,Medial Lower Leg: Change  dressing every week Follow-up Appointments: Wound #5 Left,Medial Lower Leg: Return Appointment in 1 week. Nurse Visit as needed Edema Control: Wound #5 Left,Medial Lower Leg: 3 Layer Compression System - Left Lower Extremity 1. I continued with TCA and silver alginate under compression 2. This patient has had odd-looking areas and wounds in this area before. Most of this is epithelialized Electronic Signature(s) Signed: 12/17/2018 6:19:03 PM By: Linton Ham MD Entered By: Linton Ham on 12/17/2018 15:49:34 Vandruff, [name] ([id number]) -------------------------------------------------------------------------------- SuperBill Details Patient Name: TAILYN, HANTZ. Date of Service: 12/17/2018 Medical Record Number: [id number] Patient Account Number: [id number] Date of Birth/Sex: February 11, 1946 (73 y.o. F) Treating RN: Cornell Barman Primary Care Provider: [name] Other Clinician: Referring Provider: [name] Treating Provider/Extender: Dellia Nims  MICHAEL G Weeks in Treatment: 1 Diagnosis Coding ICD-10 Codes Code Description O70.786 Non-pressure chronic ulcer of right calf limited to breakdown of skin I87.321 Chronic venous hypertension (idiopathic) with inflammation of right lower extremity I89.0 Lymphedema, not elsewhere classified Facility Procedures CPT4 Code: [id number] Description: (Facility Use Only) [phone] - [place] LWR LT LEG Modifier: Quantity: 1 Physician Procedures CPT4 Code Description: [id number] - WC PHYS LEVEL 3 - EST PT ICD-10 Diagnosis Description L97.211 Non-pressure chronic ulcer of right calf limited to breakdown I87.321 Chronic venous hypertension (idiopathic) with inflammation of I89.0 Lymphedema, not  elsewhere classified Modifier: of skin right lower extr Quantity: 1 emity Electronic Signature(s) Signed: 12/17/2018 6:19:03 PM By: Linton Ham MD Entered By: Linton Ham on 12/17/2018 15:50:03

## 2018-12-19 NOTE — Progress Notes (Signed)
[name], [name] ([id number]) Visit Report for 12/17/2018 Arrival Information Details Patient Name: [name], [name]. Date of Service: 12/17/2018 1:15 PM Medical Record Number: [id number] Patient Account Number: [id number] Date of Birth/Sex: 1946/03/09 (73 y.o. F) Treating RN: [name] Primary Care England Greb: [name] Other Clinician: Referring [name]: [name] Treating [name]/Extender: [name] in Treatment: 1 Visit Information History Since Last Visit Added or deleted any medications: No Patient Arrived: Ambulatory Any new allergies or adverse reactions: No Arrival Time: 13:20 Had a fall or experienced change in No Accompanied By: self activities of daily living that may affect Transfer Assistance: None risk of falls: Patient Identification Verified: Yes Signs or symptoms of abuse/neglect since last visito No Secondary Verification Process Completed: Yes Hospitalized since last visit: No Implantable device outside of the clinic excluding No cellular tissue based products placed in the center since last visit: Has Dressing in Place as Prescribed: Yes Pain Present Now: No Electronic Signature(s) Signed: 12/17/2018 1:44:33 PM By: [name] Entered By: [name] on 12/17/2018 13:25:08 [name] ([id number]) -------------------------------------------------------------------------------- Encounter Discharge Information Details Patient Name: [name], [name]. Date of Service: 12/17/2018 1:15 PM Medical Record Number: [id number] Patient Account Number: [id number] Date of Birth/Sex: 1946/10/28 (73 y.o. F) Treating RN: [name] Primary Care Rayner Erman: [name] Other Clinician: Referring Undrea Shipes: [name] Treating Simara Rhyner/Extender: [name] in Treatment: 1 Encounter Discharge Information Items Discharge Condition: Stable Ambulatory Status: Walker Discharge Destination: Home Transportation: Private  Auto Accompanied By: self Schedule Follow-up Appointment: Yes Clinical Summary of Care: Electronic Signature(s) Signed: 12/18/2018 8:43:41 AM By: [name], [name], RN, CWS, [name], [name] Entered By: [name], [name], RN, CWS, Kim on 12/17/2018 14:07:46 [name] ([id number]) -------------------------------------------------------------------------------- Lower Extremity Assessment Details Patient Name: [name], [name]. Date of Service: 12/17/2018 1:15 PM Medical Record Number: [id number] Patient Account Number: [id number] Date of Birth/Sex: 05-26-1946 (73 y.o. F) Treating RN: [name] Primary Care Demarcus Thielke: [name] Other Clinician: Referring Ashleymarie Granderson: [name] Treating Duff Pozzi/Extender: [name] in Treatment: 1 Edema Assessment Assessed: [Left: No] [Right: No] [Left: Edema] [Right: :] Calf Left: Right: Point of Measurement: 32 cm From Medial Instep 43 cm cm Ankle Left: Right: Point of Measurement: 12 cm From Medial Instep 24 cm cm Vascular Assessment Claudication: Claudication Assessment [Left:None] Pulses: Dorsalis Pedis Palpable: [Left:Yes] Posterior Tibial Extremity colors, hair growth, and conditions: Extremity Color: [Left:Normal] Hair Growth on Extremity: [Left:Yes] Temperature of Extremity: [Left:[name]] Capillary Refill: [Left:< 3 seconds] Toe Nail Assessment Left: Right: Thick: Yes Discolored: No Deformed: No Improper Length and Hygiene: No Electronic Signature(s) Signed: 12/17/2018 1:44:33 PM By: [name] Entered By: [name] on 12/17/2018 13:38:54 [name] ([id number]) -------------------------------------------------------------------------------- Multi Wound Chart Details Patient Name: [name], [name]. Date of Service: 12/17/2018 1:15 PM Medical Record Number: [id number] Patient Account Number: [id number] Date of Birth/Sex: 05/19/1946 (73 y.o. F) Treating RN: [name] Primary Care Tanaysia Bhardwaj: [name] Other  Clinician: Referring Emersyn Wyss: [name] Treating Dawaun Brancato/Extender: [name] in Treatment: 1 Vital Signs Height(in): 63 Pulse(bpm): 65 Weight(lbs): 224.7 Blood Pressure(mmHg): 147/83 Body Mass Index(BMI): 40 Temperature(F): 98.3 Respiratory Rate 16 (breaths/min): Photos: [N/A:N/A] Wound Location: Left Lower Leg - Medial N/A N/A Wounding Event: Gradually Appeared N/A N/A Primary Etiology: Lymphedema N/A N/A Comorbid History: Cataracts, Asthma, Sleep N/A N/A Apnea, Deep Vein Thrombosis, Hypertension, Peripheral Venous Disease, Osteoarthritis, Received Chemotherapy, Received Radiation Date Acquired: 11/19/2018 N/A N/A Weeks of Treatment: 1 N/A N/A Wound Status: Open N/A N/A Measurements L x W x  D 3.4x3.3x0.1 N/A N/A (cm) Area (cm) : 8.812 N/A N/A Volume (cm) : 0.881 N/A N/A % Reduction in Area: -3.10% N/A N/A % Reduction in Volume: -3.00% N/A N/A Classification: Full Thickness Without N/A N/A Exposed Support Structures Exudate Amount: Medium N/A N/A Exudate Type: Serosanguineous N/A N/A Exudate Color: red, brown N/A N/A Wound Margin: Indistinct, nonvisible N/A N/A Granulation Amount: Medium (34-66%) N/A N/A Granulation Quality: Red, Pink, Hyper-granulation N/A N/A Necrotic Amount: Medium (34-66%) N/A N/A Necrotic Tissue: Eschar, Adherent Slough N/A N/A [name] ([id number]) Exposed Structures: Fat Layer (Subcutaneous N/A N/A Tissue) Exposed: Yes Fascia: No Tendon: No Muscle: No Joint: No Bone: No Epithelialization: Small (1-33%) N/A N/A Periwound Skin Texture: Excoriation: No N/A N/A Induration: No Callus: No Crepitus: No Rash: No Scarring: No Periwound Skin Moisture: Maceration: Yes N/A N/A Dry/Scaly: No Periwound Skin Color: Hemosiderin Staining: Yes N/A N/A Atrophie Blanche: No Cyanosis: No Ecchymosis: No Erythema: No Mottled: No Pallor: No Rubor: No Temperature: No Abnormality N/A N/A Tenderness on  Palpation: Yes N/A N/A Wound Preparation: Ulcer Cleansing: N/A N/A Rinsed/Irrigated with Saline, Other: soap and water Topical Anesthetic Applied: None Treatment Notes Wound #5 (Left, Medial Lower Leg) Notes Silver alginate, 3-Layer Electronic Signature(s) Signed: 12/17/2018 6:19:03 PM By: Linton Ham MD Entered By: Linton Ham on 12/17/2018 15:45:06 Santiago, [name] ([id number]) -------------------------------------------------------------------------------- Multi-Disciplinary Care Plan Details Patient Name: [name], [name]. Date of Service: 12/17/2018 1:15 PM Medical Record Number: [id number] Patient Account Number: [id number] Date of Birth/Sex: 01-Feb-1946 (73 y.o. F) Treating RN: [name] Primary Care Benna Amber: [name] Other Clinician: Referring Rhian Funari: [name] Treating Roddrick Sharron/Extender: [name] in Treatment: 1 Active Inactive Orientation to the Wound Care Program Nursing Diagnoses: Knowledge deficit related to the wound healing center program Goals: Patient/caregiver will verbalize understanding of the [place] Program Date Initiated: 12/10/2018 Target Resolution Date: 01/09/2019 Goal Status: Active Interventions: Provide education on orientation to the wound center Notes: Soft Tissue Infection Nursing Diagnoses: Impaired tissue integrity Goals: Patient's soft tissue infection will resolve Date Initiated: 12/10/2018 Target Resolution Date: 01/09/2019 Goal Status: Active Interventions: Assess signs and symptoms of infection every visit Notes: Venous Leg Ulcer Nursing Diagnoses: Actual venous Insuffiency (use after diagnosis is confirmed) Goals: Patient will maintain optimal edema control Date Initiated: 12/10/2018 Target Resolution Date: 01/09/2019 Goal Status: Active Interventions: Assess peripheral edema status every visit. [name], [name] ([id number]) Treatment Activities: Therapeutic compression applied :  12/10/2018 Notes: Wound/Skin Impairment Nursing Diagnoses: Impaired tissue integrity Goals: Patient/caregiver will verbalize understanding of skin care regimen Date Initiated: 12/10/2018 Target Resolution Date: 01/09/2019 Goal Status: Active Interventions: Assess ulceration(s) every visit Treatment Activities: Topical wound management initiated : 12/10/2018 Notes: Electronic Signature(s) Signed: 12/18/2018 8:43:41 AM By: [name], [name], RN, CWS, [name], [name] Entered By: [name], [name], RN, CWS, Kim on 12/17/2018 13:44:12 [name], [name] ([id number]) -------------------------------------------------------------------------------- Pain Assessment Details Patient Name: [name], [name]. Date of Service: 12/17/2018 1:15 PM Medical Record Number: [id number] Patient Account Number: [id number] Date of Birth/Sex: 1946-04-08 (73 y.o. F) Treating RN: [name] Primary Care Sawsan Riggio: [name] Other Clinician: Referring Harlynn Kimbell: [name] Treating Veleda Mun/Extender: [name] in Treatment: 1 Active Problems Location of Pain Severity and Description of Pain Patient Has Paino No Site Locations Pain Management and Medication Current Pain Management: Notes pt denies any pain at this time. Electronic Signature(s) Signed: 12/17/2018 1:44:33 PM By: [name] Entered By: [name] on 12/17/2018 13:25:37 Scholz, [name] ([id number]) -------------------------------------------------------------------------------- Patient/Caregiver Education Details Patient Name: [name], [name].  Date of Service: 12/17/2018 1:15 PM Medical Record Number: [id number] Patient Account Number: [id number] Date of Birth/Gender: August 05, 1946 (73 y.o. F) Treating RN: [name] Primary Care Physician: [name] Other Clinician: Referring Physician: Ria [name] Treating Physician/Extender: [name] in Treatment: 1 Education Assessment Education Provided To: Patient Education  Topics Provided Venous: Handouts: Controlling Swelling with Multilayered Compression Wraps Methods: Demonstration, Explain/Verbal Responses: State content correctly Wound/Skin Impairment: Handouts: Caring for Your Ulcer Methods: Demonstration, Explain/Verbal Responses: State content correctly Electronic Signature(s) Signed: 12/18/2018 8:43:41 AM By: [name], [name], RN, CWS, [name], [name] Entered By: [name], [name], RN, CWS, Kim on 12/17/2018 14:07:52 [name] ([id number]) -------------------------------------------------------------------------------- Wound Assessment Details Patient Name: ADALYNN, CORNE. Date of Service: 12/17/2018 1:15 PM Medical Record Number: [id number] Patient Account Number: [id number] Date of Birth/Sex: 11-Jul-1946 (73 y.o. F) Treating RN: [name] Primary Care Jaycie Kregel: [name] Other Clinician: Referring Salinda Snedeker: [name] Treating Tou Hayner/Extender: [name] in Treatment: 1 Wound Status Wound Number: 5 Primary Lymphedema Etiology: Wound Location: Left Lower Leg - Medial Wound Open Wounding Event: Gradually Appeared Status: Date Acquired: 11/19/2018 Comorbid Cataracts, Asthma, Sleep Apnea, Deep Vein Weeks Of Treatment: 1 History: Thrombosis, Hypertension, Peripheral Venous Clustered Wound: No Disease, Osteoarthritis, Received Chemotherapy, Received Radiation Photos Photo Uploaded By: [name] on 12/17/2018 13:42:56 Wound Measurements Length: (cm) 3.4 Width: (cm) 3.3 Depth: (cm) 0.1 Area: (cm) 8.812 Volume: (cm) 0.881 % Reduction in Area: -3.1% % Reduction in Volume: -3% Epithelialization: Small (1-33%) Tunneling: No Undermining: No Wound Description Full Thickness Without Exposed Support Classification: Structures Wound Margin: Indistinct, nonvisible Exudate Medium Amount: Exudate Type: Serosanguineous Exudate Color: red, brown Foul Odor After Cleansing: No Slough/Fibrino Yes Wound Bed Granulation  Amount: Medium (34-66%) Exposed Structure Granulation Quality: Red, Pink, Hyper-granulation Fascia Exposed: No Necrotic Amount: Medium (34-66%) Fat Layer (Subcutaneous Tissue) Exposed: Yes Necrotic Quality: Eschar, Adherent Slough Tendon Exposed: No Muscle Exposed: No Joint Exposed: No REEYA, BOUND ([id number]) Bone Exposed: No Periwound Skin Texture Texture Color No Abnormalities Noted: No No Abnormalities Noted: No Callus: No Atrophie Blanche: No Crepitus: No Cyanosis: No Excoriation: No Ecchymosis: No Induration: No Erythema: No Rash: No Hemosiderin Staining: Yes Scarring: No Mottled: No Pallor: No Moisture Rubor: No No Abnormalities Noted: No Dry / Scaly: No Temperature / Pain Maceration: Yes Temperature: No Abnormality Tenderness on Palpation: Yes Wound Preparation Ulcer Cleansing: Rinsed/Irrigated with Saline, Other: soap and water, Topical Anesthetic Applied: None Treatment Notes Wound #5 (Left, Medial Lower Leg) Notes Silver alginate, 3-Layer Electronic Signature(s) Signed: 12/17/2018 1:44:33 PM By: [name] Entered By: [name] on 12/17/2018 13:37:26 Fromm, [name] ([id number]) -------------------------------------------------------------------------------- Vitals Details Patient Name: ANGELISA, WINTHROP. Date of Service: 12/17/2018 1:15 PM Medical Record Number: [id number] Patient Account Number: [id number] Date of Birth/Sex: June 17, 1946 (73 y.o. F) Treating RN: [name] Primary Care Patricio Popwell: [name] Other Clinician: Referring Jalani Rominger: [name] Treating Makenze Ellett/Extender: [name] in Treatment: 1 Vital Signs Time Taken: 13:28 Temperature (F): 98.3 Height (in): 63 Pulse (bpm): 65 Weight (lbs): 224.7 Respiratory Rate (breaths/min): 16 Body Mass Index (BMI): 39.8 Blood Pressure (mmHg): 147/83 Reference Range: 80 - 120 mg / dl Airway Electronic Signature(s) Signed: 12/17/2018 1:44:33 PM By: [name] Entered BySecundino [name] on 12/17/2018 13:28:58

## 2018-12-21 NOTE — Progress Notes (Signed)
JUDI, JAFFE ([id number]) Visit Report for 12/19/2018 Arrival Information Details Patient Name: [name], [name]. Date of Service: 12/19/2018 1:45 PM Medical Record Number: [id number] Patient Account Number: [id number] Date of Birth/Sex: 06-22-1946 (73 y.o. F) Treating RN: Montey Hora Primary Care Deshayla Empson: [name] Other Clinician: Referring Ruhee Enck: [name] Treating Kalon Erhardt/Extender: Melburn Hake, HOYT Weeks in Treatment: 1 Visit Information History Since Last Visit Added or deleted any medications: No Patient Arrived: Ambulatory Any new allergies or adverse reactions: No Arrival Time: 13:38 Had a fall or experienced change in No Accompanied By: self activities of daily living that may affect Transfer Assistance: None risk of falls: Patient Identification Verified: Yes Signs or symptoms of abuse/neglect since last visito No Secondary Verification Process Completed: Yes Hospitalized since last visit: No Implantable device outside of the clinic excluding No cellular tissue based products placed in the center since last visit: Has Dressing in Place as Prescribed: Yes Has Compression in Place as Prescribed: Yes Pain Present Now: No Electronic Signature(s) Signed: 12/19/2018 4:52:28 PM By: Montey Hora Entered By: Montey Hora on 12/19/2018 13:38:44 Loree, [name] ([id number]) -------------------------------------------------------------------------------- Compression Therapy Details Patient Name: SHONICA, WEIER. Date of Service: 12/19/2018 1:45 PM Medical Record Number: [id number] Patient Account Number: [id number] Date of Birth/Sex: 04/12/1946 (73 y.o. F) Treating RN: Montey Hora Primary Care Matteus Mcnelly: [name] Other Clinician: Referring Bradi Arbuthnot: [name] Treating Nettie Wyffels/Extender: STONE III, HOYT Weeks in Treatment: 1 Compression Therapy Performed for Wound Assessment: Wound #5 Left,Medial Lower Leg Performed By: Clinician  Montey Hora, RN Compression Type: Three Layer Pre Treatment ABI: 1 Electronic Signature(s) Signed: 12/19/2018 4:52:28 PM By: Montey Hora Entered By: Montey Hora on 12/19/2018 13:43:41 Hancock, [name] ([id number]) -------------------------------------------------------------------------------- Encounter Discharge Information Details Patient Name: [name], [name]. Date of Service: 12/19/2018 1:45 PM Medical Record Number: [id number] Patient Account Number: [id number] Date of Birth/Sex: [birth year] (73 y.o. F) Treating RN: Montey Hora Primary Care Mahitha Hickling: [name] Other Clinician: Referring Jessey Huyett: [name] Treating Jenea Dake/Extender: Melburn Hake, HOYT Weeks in Treatment: 1 Encounter Discharge Information Items Discharge Condition: Stable Ambulatory Status: Ambulatory Discharge Destination: Home Transportation: Private Auto Accompanied By: self Schedule Follow-up Appointment: Yes Clinical Summary of Care: Electronic Signature(s) Signed: 12/19/2018 4:52:28 PM By: Montey Hora Entered By: Montey Hora on 12/19/2018 13:44:28 Zunker, [name] ([id number]) -------------------------------------------------------------------------------- Patient/Caregiver Education Details Patient Name: [name], [name]. Date of Service: 12/19/2018 1:45 PM Medical Record Number: [id number] Patient Account Number: [id number] Date of Birth/Gender: [birth year] (73 y.o. F) Treating RN: Montey Hora Primary Care Physician: [name] Other Clinician: Referring Physician: [name] Treating Physician/Extender: Sharalyn Ink in Treatment: 1 Education Assessment Education Provided To: Patient Education Topics Provided Venous: Handouts: Other: leg elevation Methods: Explain/Verbal Responses: State content correctly Electronic Signature(s) Signed: 12/19/2018 4:52:28 PM By: Montey Hora Entered By: Montey Hora on 12/19/2018 13:44:18 Pozo, [name]  ([id number]) -------------------------------------------------------------------------------- Wound Assessment Details Patient Name: [name], [name]. Date of Service: 12/19/2018 1:45 PM Medical Record Number: [id number] Patient Account Number: [id number] Date of Birth/Sex: 25-Sep-1946 (73 y.o. F) Treating RN: Montey Hora Primary Care Najma Bozarth: [name] Other Clinician: Referring Sanayah Munro: [name] Treating Varnika Butz/Extender: STONE III, HOYT Weeks in Treatment: 1 Wound Status Wound Number: 5 Primary Lymphedema Etiology: Wound Location: Left Lower Leg - Medial Wound Open Wounding Event: Gradually Appeared Status: Date Acquired: 11/19/2018 Comorbid Cataracts, Asthma, Sleep Apnea, Deep Vein Weeks Of Treatment: 1 History: Thrombosis, Hypertension, Peripheral Venous Clustered Wound: No Disease, Osteoarthritis, Received Chemotherapy, Received Radiation Photos Photo Uploaded By: Montey Hora on 12/19/2018 13:56:27  Wound Measurements Length: (cm) 4.3 Width: (cm) 5 Depth: (cm) 0.1 Area: (cm) 16.886 Volume: (cm) 1.689 % Reduction in Area: -97.6% % Reduction in Volume: -97.5% Epithelialization: Small (1-33%) Tunneling: No Undermining: No Wound Description Full Thickness Without Exposed Support Classification: Structures Wound Margin: Indistinct, nonvisible Exudate Medium Amount: Exudate Type: Serosanguineous Exudate Color: red, brown Foul Odor After Cleansing: No Slough/Fibrino Yes Wound Bed Granulation Amount: Medium (34-66%) Exposed Structure Granulation Quality: Red, Pink, Hyper-granulation Fascia Exposed: No Necrotic Amount: Medium (34-66%) Fat Layer (Subcutaneous Tissue) Exposed: Yes Necrotic Quality: Eschar, Adherent Slough Tendon Exposed: No Muscle Exposed: No Joint Exposed: No [name], [name] ([id number]) Bone Exposed: No Periwound Skin Texture Texture Color No Abnormalities Noted: No No Abnormalities Noted: No Callus: No Atrophie  Blanche: No Crepitus: No Cyanosis: No Excoriation: No Ecchymosis: No Induration: No Erythema: No Rash: No Hemosiderin Staining: Yes Scarring: No Mottled: No Pallor: No Moisture Rubor: No No Abnormalities Noted: No Dry / Scaly: No Temperature / Pain Maceration: Yes Temperature: No Abnormality Tenderness on Palpation: Yes Wound Preparation Ulcer Cleansing: Rinsed/Irrigated with Saline, Other: soap and water, Topical Anesthetic Applied: None Treatment Notes Wound #5 (Left, Medial Lower Leg) Notes Silver alginate, abd, 3-Layer with unna to anchor Electronic Signature(s) Signed: 12/19/2018 4:52:28 PM By: Montey Hora Entered By: Montey Hora on 12/19/2018 13:43:24

## 2018-12-24 ENCOUNTER — Encounter: Payer: Medicare Other | Admitting: Internal Medicine

## 2018-12-24 DIAGNOSIS — L97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d: Secondary | ICD-10-CM | POA: Diagnosis not present

## 2018-12-24 DIAGNOSIS — J45909 Unspecified asthma, uncomplicated: Secondary | ICD-10-CM | POA: Diagnosis not present

## 2018-12-24 DIAGNOSIS — G4733 Obstructive sleep apnea (adult) (pediatric): Secondary | ICD-10-CM | POA: Diagnosis not present

## 2018-12-24 DIAGNOSIS — E876 Hypokalemia: Secondary | ICD-10-CM | POA: Diagnosis not present

## 2018-12-24 DIAGNOSIS — I10 Essential (primary) hypertension: Secondary | ICD-10-CM | POA: Diagnosis not present

## 2018-12-24 DIAGNOSIS — E11622 Type 2 diabetes mellitus with other skin ulcer: Secondary | ICD-10-CM | POA: Diagnosis not present

## 2018-12-24 DIAGNOSIS — L97211 Non-pressure chronic ulcer of right calf limited to breakdown of skin: Secondary | ICD-10-CM | POA: Diagnosis not present

## 2018-12-24 DIAGNOSIS — I890 Lymphedema, not elsewhere classified: Secondary | ICD-10-CM | POA: Diagnosis not present

## 2018-12-25 NOTE — Progress Notes (Addendum)
[name], [name] ([id number]) Visit Report for 12/24/2018 Arrival Information Details Patient Name: [name], [name]. Date of Service: 12/24/2018 2:30 PM Medical Record Number: [id number] Patient Account Number: [id number] Date of Birth/Sex: 1946/01/25 (73 y.o. F) Treating RN: Montey Hora Primary Care Romilda Proby: [name] Other Clinician: Referring Geniece Akers: [name] Treating Latana Colin/Extender: Tito Dine in Treatment: 2 Visit Information History Since Last Visit Added or deleted any medications: No Patient Arrived: Ambulatory Any new allergies or adverse reactions: No Arrival Time: 14:49 Had a fall or experienced change in No Accompanied By: self activities of daily living that may affect Transfer Assistance: None risk of falls: Patient Identification Verified: Yes Signs or symptoms of abuse/neglect since last visito No Secondary Verification Process Completed: Yes Hospitalized since last visit: No Implantable device outside of the clinic excluding No cellular tissue based products placed in the center since last visit: Has Dressing in Place as Prescribed: Yes Has Compression in Place as Prescribed: Yes Pain Present Now: No Electronic Signature(s) Signed: 12/24/2018 5:36:16 PM By: Montey Hora Entered By: Montey Hora on 12/24/2018 14:50:05 Vasques, [name] ([id number]) -------------------------------------------------------------------------------- Encounter Discharge Information Details Patient Name: [name], [name]. Date of Service: 12/24/2018 2:30 PM Medical Record Number: [id number] Patient Account Number: [id number] Date of Birth/Sex: Jul 22, 1946 (73 y.o. F) Treating RN: Harold Barban Primary Care Cherryl Babin: [name] Other Clinician: Referring Dash Cardarelli: [name] Treating Cheyne Bungert/Extender: Tito Dine in Treatment: 2 Encounter Discharge Information Items Post Procedure Vitals Discharge Condition:  Stable Temperature (F): 98.1 Ambulatory Status: Ambulatory Pulse (bpm): 60 Discharge Destination: Home Respiratory Rate (breaths/min): 16 Transportation: Private Auto Blood Pressure (mmHg): 138/70 Accompanied By: self Schedule Follow-up Appointment: Yes Clinical Summary of Care: Electronic Signature(s) Signed: 12/25/2018 9:08:03 AM By: Harold Barban Entered By: Harold Barban on 12/25/2018 [place], [name] ([id number]) -------------------------------------------------------------------------------- Lower Extremity Assessment Details Patient Name: [name], [name]. Date of Service: 12/24/2018 2:30 PM Medical Record Number: [id number] Patient Account Number: [id number] Date of Birth/Sex: 05-02-1946 (73 y.o. F) Treating RN: Montey Hora Primary Care Krizia Flight: [name] Other Clinician: Referring Khamila Bassinger: [name] Treating Bhavesh Vazquez/Extender: Tito Dine in Treatment: 2 Edema Assessment Assessed: [Left: No] [Right: No] Edema: [Left: Ye] [Right: s] Calf Left: Right: Point of Measurement: 32 cm From Medial Instep 43.7 cm cm Ankle Left: Right: Point of Measurement: 12 cm From Medial Instep 23.5 cm cm Vascular Assessment Pulses: Dorsalis Pedis Palpable: [Left:Yes] Posterior Tibial Extremity colors, hair growth, and conditions: Extremity Color: [Left:Hyperpigmented] Hair Growth on Extremity: [Left:Yes] Temperature of Extremity: [Left:Warm] Capillary Refill: [Left:< 3 seconds] Toe Nail Assessment Left: Right: Thick: Yes Discolored: Yes Deformed: No Improper Length and Hygiene: No Electronic Signature(s) Signed: 12/24/2018 5:36:16 PM By: Montey Hora Entered By: Montey Hora on 12/24/2018 14:58:12 Delcarlo, Rheagan Lenna Sciara ([id number]) -------------------------------------------------------------------------------- Multi Wound Chart Details Patient Name: [name], [name]. Date of Service: 12/24/2018 2:30 PM Medical Record Number:  [id number] Patient Account Number: [id number] Date of Birth/Sex: 01/22/1946 (73 y.o. F) Treating RN: Cornell Barman Primary Care Ravan Schlemmer: [name] Other Clinician: Referring Jessicamarie Amiri: [name] Treating Kaysi Ourada/Extender: Tito Dine in Treatment: 2 Vital Signs Height(in): 63 Pulse(bpm): 60 Weight(lbs): 224.7 Blood Pressure(mmHg): 138/70 Body Mass Index(BMI): 40 Temperature(F): 98.1 Respiratory Rate 16 (breaths/min): Photos: [N/A:N/A] Wound Location: Left Lower Leg - Medial N/A N/A Wounding Event: Gradually Appeared N/A N/A Primary Etiology: Lymphedema N/A N/A Comorbid History: Cataracts, Asthma, Sleep N/A N/A Apnea, Deep Vein Thrombosis, Hypertension, Peripheral Venous Disease, Osteoarthritis, Received Chemotherapy, Received Radiation Date Acquired: 11/19/2018 N/A N/A Weeks of Treatment:  2 N/A N/A Wound Status: Open N/A N/A Measurements L x W x D 5x4x0.1 N/A N/A (cm) Area (cm) : 15.708 N/A N/A Volume (cm) : 1.571 N/A N/A % Reduction in Area: -83.80% N/A N/A % Reduction in Volume: -83.70% N/A N/A Classification: Full Thickness Without N/A N/A Exposed Support Structures Exudate Amount: Medium N/A N/A Exudate Type: Serosanguineous N/A N/A Exudate Color: red, brown N/A N/A Wound Margin: Indistinct, nonvisible N/A N/A Granulation Amount: Medium (34-66%) N/A N/A Granulation Quality: Red, Pink, Hyper-granulation N/A N/A Necrotic Amount: Medium (34-66%) N/A N/A Necrotic Tissue: Eschar, Adherent Slough N/A N/A NINAH, MOCCIO ([id number]) Exposed Structures: Fat Layer (Subcutaneous N/A N/A Tissue) Exposed: Yes Fascia: No Tendon: No Muscle: No Joint: No Bone: No Epithelialization: Small (1-33%) N/A N/A Debridement: Debridement - Excisional N/A N/A Pre-procedure 15:29 N/A N/A Verification/Time Out Taken: Pain Control: Lidocaine N/A N/A Tissue Debrided: Subcutaneous, Slough N/A N/A Level: Skin/Subcutaneous Tissue N/A N/A Debridement Area  (sq cm): 20 N/A N/A Instrument: Curette N/A N/A Bleeding: Moderate N/A N/A Hemostasis Achieved: Pressure N/A N/A Debridement Treatment Procedure was tolerated well N/A N/A Response: Post Debridement 5x4x0.2 N/A N/A Measurements L x W x D (cm) Post Debridement Volume: 3.142 N/A N/A (cm) Periwound Skin Texture: Excoriation: No N/A N/A Induration: No Callus: No Crepitus: No Rash: No Scarring: No Periwound Skin Moisture: Maceration: Yes N/A N/A Dry/Scaly: No Periwound Skin Color: Hemosiderin Staining: Yes N/A N/A Atrophie Blanche: No Cyanosis: No Ecchymosis: No Erythema: No Mottled: No Pallor: No Rubor: No Temperature: No Abnormality N/A N/A Tenderness on Palpation: Yes N/A N/A Wound Preparation: Ulcer Cleansing: N/A N/A Rinsed/Irrigated with Saline, Other: soap and water Topical Anesthetic Applied: Other: lidocaine 4% Procedures Performed: Debridement N/A N/A Treatment Notes Electronic Signature(s) Signed: 12/24/2018 6:18:32 PM By: Linton Ham MD Entered By: Linton Ham on 12/24/2018 17:49:49 [name], [name] ([id number]) [name], [name] ([id number]) -------------------------------------------------------------------------------- Multi-Disciplinary Care Plan Details Patient Name: [name], [name]. Date of Service: 12/24/2018 2:30 PM Medical Record Number: [id number] Patient Account Number: [id number] Date of Birth/Sex: 1946/07/22 (73 y.o. F) Treating RN: Cornell Barman Primary Care Lilit Cinelli: [name] Other Clinician: Referring Kristyn Obyrne: [name] Treating Johnna Bollier/Extender: Tito Dine in Treatment: 2 Active Inactive Orientation to the Wound Care Program Nursing Diagnoses: Knowledge deficit related to the wound healing center program Goals: Patient/caregiver will verbalize understanding of the [facility] Date Initiated: 12/10/2018 Target Resolution Date: 01/09/2019 Goal Status: Active Interventions: Provide  education on orientation to the wound center Notes: Soft Tissue Infection Nursing Diagnoses: Impaired tissue integrity Goals: Patient's soft tissue infection will resolve Date Initiated: 12/10/2018 Target Resolution Date: 01/09/2019 Goal Status: Active Interventions: Assess signs and symptoms of infection every visit Notes: Venous Leg Ulcer Nursing Diagnoses: Actual venous Insuffiency (use after diagnosis is confirmed) Goals: Patient will maintain optimal edema control Date Initiated: 12/10/2018 Target Resolution Date: 01/09/2019 Goal Status: Active Interventions: Assess peripheral edema status every visit. ALYANA, KREITER ([id number]) Treatment Activities: Therapeutic compression applied : 12/10/2018 Notes: Wound/Skin Impairment Nursing Diagnoses: Impaired tissue integrity Goals: Patient/caregiver will verbalize understanding of skin care regimen Date Initiated: 12/10/2018 Target Resolution Date: 01/09/2019 Goal Status: Active Interventions: Assess ulceration(s) every visit Treatment Activities: Topical wound management initiated : 12/10/2018 Notes: Electronic Signature(s) Signed: 12/24/2018 6:42:35 PM By: [name], [name], RN, CWS, [name], [name] Entered By: [name], [name], RN, CWS, Kim on 12/24/2018 15:23:28 [name], [name] ([id number]) -------------------------------------------------------------------------------- Pain Assessment Details Patient Name: [name], [name]. Date of Service: 12/24/2018 2:30 PM Medical Record Number: [id number] Patient Account Number: [id number]  Date of Birth/Sex: 1946/08/15 (73 y.o. F) Treating RN: Montey Hora Primary Care Era Parr: [name] Other Clinician: Referring Sinai Mahany: [name] Treating Aarush Stukey/Extender: Tito Dine in Treatment: 2 Active Problems Location of Pain Severity and Description of Pain Patient Has Paino Yes Site Locations Character of Pain Describe the Pain: Burning Pain Management and Medication Current  Pain Management: Electronic Signature(s) Signed: 12/24/2018 5:36:16 PM By: Montey Hora Entered By: Montey Hora on 12/24/2018 14:51:12 [name], [name] ([id number]) -------------------------------------------------------------------------------- Patient/Caregiver Education Details Patient Name: KATALEYAH, CARDUCCI. Date of Service: 12/24/2018 2:30 PM Medical Record Number: [id number] Patient Account Number: [id number] Date of Birth/Gender: [birth year] (73 y.o. F) Treating RN: Cornell Barman Primary Care Physician: [name] Other Clinician: Referring Physician: [name] Treating Physician/Extender: Tito Dine in Treatment: 2 Education Assessment Education Provided To: Patient Education Topics Provided Venous: Handouts: Controlling Swelling with Multilayered Compression Wraps Methods: Demonstration, Explain/Verbal Responses: State content correctly Wound/Skin Impairment: Handouts: Caring for Your Ulcer Methods: Demonstration, Explain/Verbal Responses: State content correctly Electronic Signature(s) Signed: 12/30/2018 11:13:25 AM By: Harold Barban Previous Signature: 12/24/2018 6:42:35 PM Version By: [name], [name], RN, CWS, [name], [name] Entered By: Harold Barban on 12/25/2018 09:08:08 [name], [name] ([id number]) -------------------------------------------------------------------------------- Wound Assessment Details Patient Name: NASHIKA, COKER. Date of Service: 12/24/2018 2:30 PM Medical Record Number: [id number] Patient Account Number: [id number] Date of Birth/Sex: [birth year] (73 y.o. F) Treating RN: Montey Hora Primary Care Darral Rishel: [name] Other Clinician: Referring Quintez Maselli: [name] Treating Ajai Terhaar/Extender: Tito Dine in Treatment: 2 Wound Status Wound Number: 5 Primary Lymphedema Etiology: Wound Location: Left Lower Leg - Medial Wound Open Wounding Event: Gradually Appeared Status: Date Acquired:  11/19/2018 Comorbid Cataracts, Asthma, Sleep Apnea, Deep Vein Weeks Of Treatment: 2 History: Thrombosis, Hypertension, Peripheral Venous Clustered Wound: No Disease, Osteoarthritis, Received Chemotherapy, Received Radiation Photos Photo Uploaded By: Montey Hora on 12/24/2018 16:39:05 Wound Measurements Length: (cm) 5 Width: (cm) 4 Depth: (cm) 0.1 Area: (cm) 15.708 Volume: (cm) 1.571 % Reduction in Area: -83.8% % Reduction in Volume: -83.7% Epithelialization: Small (1-33%) Tunneling: No Undermining: No Wound Description Full Thickness Without Exposed Support Classification: Structures Wound Margin: Indistinct, nonvisible Exudate Medium Amount: Exudate Type: Serosanguineous Exudate Color: red, brown Foul Odor After Cleansing: No Slough/Fibrino Yes Wound Bed Granulation Amount: Medium (34-66%) Exposed Structure Granulation Quality: Red, Pink, Hyper-granulation Fascia Exposed: No Necrotic Amount: Medium (34-66%) Fat Layer (Subcutaneous Tissue) Exposed: Yes Necrotic Quality: Eschar, Adherent Slough Tendon Exposed: No Muscle Exposed: No Joint Exposed: No ARLISHA, PATALANO. ([id number]) Bone Exposed: No Periwound Skin Texture Texture Color No Abnormalities Noted: No No Abnormalities Noted: No Callus: No Atrophie Blanche: No Crepitus: No Cyanosis: No Excoriation: No Ecchymosis: No Induration: No Erythema: No Rash: No Hemosiderin Staining: Yes Scarring: No Mottled: No Pallor: No Moisture Rubor: No No Abnormalities Noted: No Dry / Scaly: No Temperature / Pain Maceration: Yes Temperature: No Abnormality Tenderness on Palpation: Yes Wound Preparation Ulcer Cleansing: Rinsed/Irrigated with Saline, Other: soap and water, Topical Anesthetic Applied: Other: lidocaine 4%, Electronic Signature(s) Signed: 12/24/2018 5:36:16 PM By: Montey Hora Entered By: Montey Hora on 12/24/2018 15:01:00 NICOLASA, MILBRATH  ([id number]) -------------------------------------------------------------------------------- Vitals Details Patient Name: DEANE, MELICK. Date of Service: 12/24/2018 2:30 PM Medical Record Number: [id number] Patient Account Number: [id number] Date of Birth/Sex: 18-Jul-1946 (73 y.o. F) Treating RN: Montey Hora Primary Care Kvon Mcilhenny: [name] Other Clinician: Referring Asmar Brozek: [name] Treating Dreanna Kyllo/Extender: Tito Dine in Treatment: 2 Vital Signs Time Taken: 14:53 Temperature (F): 98.1 Height (in): 63 Pulse (  bpm): 60 Weight (lbs): 224.7 Respiratory Rate (breaths/min): 16 Body Mass Index (BMI): 39.8 Blood Pressure (mmHg): 138/70 Reference Range: 80 - 120 mg / dl Electronic Signature(s) Signed: 12/24/2018 5:36:16 PM By: Montey Hora Entered By: Montey Hora on 12/24/2018 14:53:50

## 2018-12-25 NOTE — Progress Notes (Signed)
ANISIA, LEIJA ([id number]) Visit Report for 12/24/2018 Debridement Details Patient Name: KYLIEGH, JESTER. Date of Service: 12/24/2018 2:30 PM Medical Record Number: [id number] Patient Account Number: [id number] Date of Birth/Sex: [birth year] (73 y.o. F) Treating RN: Cornell Barman Primary Care Provider: [name] Other Clinician: Referring Provider: [name] Treating Provider/Extender: Tito Dine in Treatment: 2 Debridement Performed for Wound #5 Left,Medial Lower Leg Assessment: Performed By: Physician [name] Dillon, MD Debridement Type: Debridement Level of Consciousness (Pre- Awake and Alert procedure): Pre-procedure Verification/Time Yes - 15:29 Out Taken: Start Time: 15:29 Pain Control: Lidocaine Total Area Debrided (L x W): 5 (cm) x 4 (cm) = 20 (cm) Tissue and other material Viable, Non-Viable, Slough, Subcutaneous, Slough debrided: Level: Skin/Subcutaneous Tissue Debridement Description: Excisional Instrument: Curette Bleeding: Moderate Hemostasis Achieved: Pressure End Time: 15:32 Response to Treatment: Procedure was tolerated well Level of Consciousness Awake and Alert (Post-procedure): Post Debridement Measurements of Total Wound Length: (cm) 5 Width: (cm) 4 Depth: (cm) 0.2 Volume: (cm) 3.142 Character of Wound/Ulcer Post Debridement: Requires Further Debridement Post Procedure Diagnosis Same as Pre-procedure Electronic Signature(s) Signed: 12/24/2018 6:18:32 PM By: Linton Ham MD Signed: 12/24/2018 6:42:35 PM By: [name], [name], RN, CWS, [name], [name] Entered By: Linton Ham on 12/24/2018 17:50:00 ARI, ENGELBRECHT ([id number]) -------------------------------------------------------------------------------- HPI Details Patient Name: BERLINDA, FARVE. Date of Service: 12/24/2018 2:30 PM Medical Record Number: [id number] Patient Account Number: [id number] Date of Birth/Sex: 17-Aug-1946 (73 y.o. F) Treating RN: Cornell Barman Primary Care Provider: [name] Other Clinician: Referring Provider: [name] Treating Provider/Extender: Tito Dine in Treatment: 2 History of Present Illness HPI Description: Pleasant 73 year old with history of chronic venous insufficiency. No diabetes or peripheral vascular disease. Left ABI 1.29. Questionable history of left lower extremity DVT. She developed a recurrent ulceration on her left lateral calf in December 2015, which she attributes to poor diet and subsequent lower extremity edema. She underwent endovenous laser ablation of her left greater saphenous vein in 2010. She underwent laser ablation of accessory branch of left GSV in April 2016 by Dr. Kellie Simmering at [facility]. She was previously wearing Unna boots, which she tolerated well. Tolerating 2 layer compression and cadexomer iodine. She returns to clinic for follow-up and is without new complaints. She denies any significant pain at this time. She reports persistent pain with pressure. No claudication or ischemic rest pain. No fever or chills. No drainage. READMISSION 11/13/16; this is a 73 year old woman who is not a diabetic. She is here for a review of a painful area on her left medial lower extremity. I note that she was seen here previously last year for wound I believe to be in the same area. At that time she had undergone previously a left greater saphenous vein ablation by Dr. Kellie Simmering and she had a ablation of the anterior accessory branch of the left greater saphenous vein in March 2016. Seeing that the wound actually closed over. In reviewing the history with her today the ulcer in this area has been recurrent. She describes a biopsy of this area in 2009 that only showed stasis physiology. 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 Do not see that she has ever had arterial studies in the left leg. She tells me she has a  follow-up with Dr. Kellie Simmering in roughly 10 days In any case she developed the reopening of this area roughly a month ago. On the background of this she describes rapidly increasing edema which has responded to Lasix 40 mg and metolazone 2.5 mg as well as the patient's lymph massage. She has been told she has both venous insufficiency and lymphedema but she cannot tolerate compression stockings 11/28/16; the patient saw Dr. Kellie Simmering recently. Per the patient he did arterial Dopplers in the office that did not show evidence of arterial insufficiency, per the patient he stated "treat this like an ordinary venous ulcer". She also saw her dermatologist Dr. Ronnald Ramp who felt that this was more of a vascular ulcer. In general things are improving although she arrives today with increasing bilateral lower extremity edema with weeping a deeper fluid through the wound on the left medial leg compatible with some degree of lymphedema 12/04/16; the patient's wound is fully epithelialized but I don't think fully healed. We will do another week of depression with Promogran and TCA however I suspect we'll be able to discharge her next week. This is a very unusual-looking wound which was initially a figure-of-eight type wound lying on its side surrounded by petechial like hemorrhage. She has had venous ablation on this side. She apparently does not have an arterial issue per Dr. Kellie Simmering. She saw her dermatologist thought it was "vascular". Patient is definitely going to need ongoing compression and I talked about this with her today she will go to elastic therapy after she leaves here next week 12/11/16; the patient's wound is not completely closed today. She has surrounding scar tissue and in further discussion with the patient it would appear that she had ulcers in this  area in 2009 for a prolonged period of time ultimately requiring a punch biopsy of this area that only showed venous insufficiency. I did not previously pickup on this part of the history from the patient. 12/18/16; the patient's wound is completely epithelialized. There is no open area here. She has significant bilateral venous insufficiency with secondary lymphedema to a mild-to-moderate degree she does not have compression stockings.. She did not say anything to me when I was in the room, she told our intake nurse that she was still having pain in this area. This isn't unusual recurrent small open area. She is going to go to elastic therapy to obtain compression stockings. 12/25/16; the patient's wound is fully epithelialized. There is no open area here. The patient describes some continued episodic discomfort in this area medial left calf. However everything looks fine and healed here. She is been to elastic therapy and caught herself 15-20 mmHg stockings, they apparently were having trouble getting 20-30 mm stockings in her size RONEKA, GILPIN ([id number]) 01/22/17; this is a patient we discharged from the clinic a month ago. She has a recurrent open wound on her medial left calf. She had 15 mm support stockings. I told her I thought she needed 20-30 mm compression stockings. She tells me that she has been ill with hospitalization secondary to asthma and is been found to have severe hypokalemia likely secondary to a combination of Lasix and metolazone. This morning she noted blistering and leaking fluid on the posterior part of her left leg. She called our intake nurse urgently and we was saw her this afternoon. She has not had any real discomfort here. I don't know that she's been wearing any stockings on this leg for at least 2-3 days. ABIs in  this clinic were 1.21 on the right and 1.3 on the left. She is previously seen vascular surgery who does not think that there is a peripheral arterial  issue. 01/30/17; Patient arrives with no open wound on the left leg. She has been to elastic therapy and obtained 20-19mmhg below knee stockings and she has one on the right leg today. READMISSION 02/19/18; this Bamberg is a now 73 year old patient we've had in this clinic perhaps 3 times before. I had last looked at her from January 07 December 2016 with an area on the medial left leg. We discharged her on 12/25/16 however she had to be readmitted on 01/22/17 with a recurrence. I have in my notes that we discharged her on 20-30 mm stockings although she tells me she was only wearing support hose because she cannot get stockings on predominantly related to her cervical spine surgery/issues. She has had previous ablations done by vein and vascular in [place] including a great saphenous vein ablation on the left with an anterior accessory branch ablation I think both of these were in 2016. On one of the previous visit she had a biopsy noted 2009 that was negative. She is not felt to have an arterial issue. She is not a diabetic. She does have a history of obstructive sleep apnea hypertension asthma as well as chronic venous insufficiency and lymphedema. On this occasion she noted 2 dry scaly patch on her left leg. She tried to put lotion on this it didn't really help. There were 2 open areas.the patient has been seeing her primary physician from 02/05/18 through 02/14/18. She had Unna boots applied. The superior wound now on the lateral left leg has closed but she's had one wound that remains open on the lateral left leg. This is not the same spot as we dealt with in 2018. ABIs in this clinic were 1.3 bilaterally 02/26/18; patient has a small wound on the left lateral calf. Dimensions are down. She has chronic venous insufficiency and lymphedema. 03/05/18; small open area on the left lateral calf. Dimensions are down. Tightly adherent necrotic debris over the surface of the wound which was difficult to  remove. Also the dressing [over collagen] stuck to the wound surface. This was removed with some difficulty as well. Change the primary dressing to Hydrofera Blue ready 03/12/18; small open area on the left lateral calf. Comes in with tightly adherent surface eschar as well as some adherent Hydrofera Blue. 03/19/18; open area on the left lateral calf. Again adherent surface eschar as well as some adherent Hydrofera Blue nonviable subcutaneous tissue. She complained of pain all week even with the reduction from 4-3 layer compression I put on last week. Also she had an increase in her ankle and calf measurements probably related to the same thing. 03/26/18; open area on the left lateral calf. A very small open area remains here. We used silver alginate starting last week as the Hydrofera Blue seem to stick to the wound bed. In using 4-layer compression 04/02/18; the open area in the left lateral calf at some adherent slough which I removed there is no open area here. We are able to transition her into her own compression stocking. Truthfully I think this is probably his support hose. However this does not maintain skin integrity will be limited. She cannot put over the toe compression stockings on because of neck problems hand problems etc. She is allergic to the lining layer of juxta lites. We might be forced to use extremitease  stocking should this fail READMIT 11/24/2018 Patient is now a 73 year old woman who is not a diabetic. She has been in this clinic on at least 3 previous occasions largely with recurrent wounds on her left leg secondary to chronic venous insufficiency with secondary lymphedema. Her situation is complicated by inability to get stockings on and an allergy to neoprene which is apparently a component and at least juxta lites and other stockings. As a result she really has not been wearing any stockings on her legs. She tells [place] that roughly 2 or 3 weeks ago she started noticing a  stinging sensation just above her ankle on the left medial aspect. She has been diagnosed with pseudogout and she wondered whether this was what she was experiencing. She tried to dress this with something she bought at the store however subsequently it pulled skin off and now she has an open wound that is not improving. She has been using Vaseline gauze with a cover bandage. She saw her primary doctor last week who put an Haematologist on her. ABIs in this clinic was 1.03 on the left YADIRA, HADA. ([id number]) 2/12; the area is on the left medial ankle. Odd-looking wound with what looks to be surface epithelialization but a multitude of small petechial openings. This clearly not closed yet. We have been using silver alginate under 3 layer compression with TCA 2/19; the wound area did not look quite as good this week. Necrotic debris over the majority of the wound surface which required debridement. She continues to have a multitude of what looked to be small petechial openings. She reminds [place] that she had a biopsy on this initially during her first outbreak in 2015 in [place] [facility]. She expresses concern about this being a possible melanoma. 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 I assured her that this does not look anything like melanoma. Except for the petechial reaction it does look like a venous insufficiency area and she certainly has evidence of this on both sides Electronic Signature(s) Signed: 12/24/2018 6:18:32 PM By: Linton Ham MD Entered By: Linton Ham on 12/24/2018 17:51:54 Stemmler, [name] ([id number]) -------------------------------------------------------------------------------- Physical Exam Details Patient Name: MARWA, FUHRMAN. Date of Service: 12/24/2018 2:30 PM Medical Record Number: [id number] Patient Account Number: [id number] Date of Birth/Sex: Dec 28, 1945 (73 y.o.  F) Treating RN: Cornell Barman Primary Care Provider: [name] Other Clinician: Referring Provider: [name] Treating Provider/Extender: [name] Dillon Weeks in Treatment: 2 Constitutional Sitting or standing Blood Pressure is within target range for patient.. Pulse regular and within target range for patient.Marland Kitchen Respirations regular, non-labored and within target range.. Temperature is normal and within the target range for the patient.Marland Kitchen appears in no distress. Notes Wound exam; the patient's wound had adherent debris that I removed with a #3 curette. She continues to have an almost petechial looking rash around this. This apparently has been chronic. Hemostasis with direct pressure. Electronic Signature(s) Signed: 12/24/2018 6:18:32 PM By: Linton Ham MD Entered By: Linton Ham on 12/24/2018 17:52:47 Champney, [name] ([id number]) -------------------------------------------------------------------------------- Physician Orders Details Patient Name: HUXLEY, VANWAGONER. Date of Service: 12/24/2018 2:30 PM Medical Record Number: [id number] Patient Account Number: [id number] Date of Birth/Sex: 07-14-1946 (73 y.o. F) Treating RN: Cornell Barman Primary Care Provider: [name] Other Clinician: Referring Provider: [name] Treating Provider/Extender: Tito Dine in Treatment: 2 Verbal / Phone Orders: No Diagnosis Coding Wound Cleansing Wound #5 Left,Medial  Lower Leg o Clean wound with Normal Saline. o May Shower, gently pat wound dry prior to applying new dressing. Anesthetic (add to Medication List) Wound #5 Left,Medial Lower Leg o Topical Lidocaine 4% cream applied to wound bed prior to debridement (In Clinic Only). o Benzocaine Topical Anesthetic Spray applied to wound bed prior to debridement (In Clinic Only). Skin Barriers/Peri-Wound Care Wound #5 Left,Medial Lower Leg o Triamcinolone Acetonide Ointment (TCA) - on wound and  peri-wound Primary Wound Dressing Wound #5 Left,Medial Lower Leg o Hydrafera Blue Ready Transfer Secondary Dressing Wound #5 Left,Medial Lower Leg o ABD pad Dressing Change Frequency Wound #5 Left,Medial Lower Leg o Change dressing every week Follow-up Appointments Wound #5 Left,Medial Lower Leg o Return Appointment in 1 week. o Nurse Visit as needed Edema Control Wound #5 Left,Medial Lower Leg o 3 Layer Compression System - Left Lower Extremity Electronic Signature(s) Signed: 12/24/2018 6:18:32 PM By: Linton Ham MD Signed: 12/24/2018 6:42:35 PM By: [name], [name], RN, CWS, [name], [name] Entered By: [name], [name], RN, CWS, Kim on 12/24/2018 15:31:56 ODESSIA, ASLESON ([id number], LALLIER ([id number]) -------------------------------------------------------------------------------- Problem List Details Patient Name: NAMRATA, DANGLER. Date of Service: 12/24/2018 2:30 PM Medical Record Number: [id number] Patient Account Number: [id number] Date of Birth/Sex: 03-24-1946 (73 y.o. F) Treating RN: Cornell Barman Primary Care Provider: [name] Other Clinician: Referring Provider: [name] Treating Provider/Extender: Tito Dine in Treatment: 2 Active Problems ICD-10 Evaluated Encounter Code Description Active Date Today Diagnosis L97.211 Non-pressure chronic ulcer of right calf limited to breakdown 12/10/2018 No Yes of skin I87.321 Chronic venous hypertension (idiopathic) with inflammation of 12/10/2018 No Yes right lower extremity I89.0 Lymphedema, not elsewhere classified 12/10/2018 No Yes Inactive Problems Resolved Problems Electronic Signature(s) Signed: 12/24/2018 6:18:32 PM By: Linton Ham MD Entered By: Linton Ham on 12/24/2018 17:49:38 Appling, [name] ([id number]) -------------------------------------------------------------------------------- Progress Note Details Patient Name: ESHIKA, RECKART. Date of Service: 12/24/2018 2:30  PM Medical Record Number: [id number] Patient Account Number: [id number] Date of Birth/Sex: 1946/08/06 (73 y.o. F) Treating RN: Cornell Barman Primary Care Provider: [name] Other Clinician: Referring Provider: [name] Treating Provider/Extender: Tito Dine in Treatment: 2 Subjective History of Present Illness (HPI) Pleasant 73 year old with history of chronic venous insufficiency. No diabetes or peripheral vascular disease. Left ABI 1.29. Questionable history of left lower extremity DVT. She developed a recurrent ulceration on her left lateral calf in December 2015, which she attributes to poor diet and subsequent lower extremity edema. She underwent endovenous laser ablation of her left greater saphenous vein in 2010. She underwent laser ablation of accessory branch of left GSV in April 2016 by Dr. Kellie Simmering at [facility]. She was previously wearing Unna boots, which she tolerated well. Tolerating 2 layer compression and cadexomer iodine. She returns to clinic for follow-up and is without new complaints. She denies any significant pain at this time. She reports persistent pain with pressure. No claudication or ischemic rest pain. No fever or chills. No drainage. READMISSION 11/13/16; this is a 73 year old woman who is not a diabetic. She is here for a review of a painful area on her left medial lower extremity. I note that she was seen here previously last year for wound I believe to be in the same area. At that time she had undergone previously a left greater saphenous vein ablation by Dr. Kellie Simmering and she had a ablation of the anterior accessory branch of the left greater saphenous vein in March 2016. Seeing that the wound actually closed  over. In reviewing the history with her today the ulcer in this area has been recurrent. She describes a biopsy of this area in 2009 that only showed stasis physiology. She also has a history of today malignant melanoma in the right  shoulder for which she follows with Dr. 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 Do not see that she has ever had arterial studies in the left leg. She tells me she has a follow-up with Dr. Kellie Simmering in roughly 10 days In any case she developed the reopening of this area roughly a month ago. On the background of this she describes rapidly increasing edema which has responded to Lasix 40 mg and metolazone 2.5 mg as well as the patient's lymph massage. She has been told she has both venous insufficiency and lymphedema but she cannot tolerate compression stockings 11/28/16; the patient saw Dr. Kellie Simmering recently. Per the patient he did arterial Dopplers in the office that did not show evidence of arterial insufficiency, per the patient he stated "treat this like an ordinary venous ulcer". She also saw her dermatologist Dr. Ronnald Ramp who felt that this was more of a vascular ulcer. In general things are improving although she arrives today with increasing bilateral lower extremity edema with weeping a deeper fluid through the wound on the left medial leg compatible with some degree of lymphedema 12/04/16; the patient's wound is fully epithelialized but I don't think fully healed. We will do another week of depression with Promogran and TCA however I suspect we'll be able to discharge her next week. This is a very unusual-looking wound which was initially a figure-of-eight type wound lying on its side surrounded by petechial like hemorrhage. She has had venous ablation on this side. She apparently does not have an arterial issue per Dr. Kellie Simmering. She saw her dermatologist thought it was "vascular". Patient is definitely going to need ongoing compression and I talked about this with her today she will go to elastic therapy after she leaves here next week 12/11/16; the patient's wound is not completely  closed today. She has surrounding scar tissue and in further discussion with the patient it would appear that she had ulcers in this area in 2009 for a prolonged period of time ultimately requiring a punch biopsy of this area that only showed venous insufficiency. I did not previously pickup on this part of the history from the patient. 12/18/16; the patient's wound is completely epithelialized. There is no open area here. She has significant bilateral venous insufficiency with secondary lymphedema to a mild-to-moderate degree she does not have compression stockings.. She did not say anything to me when I was in the room, she told our intake nurse that she was still having pain in this area. This isn't unusual recurrent small open area. She is going to go to elastic therapy to obtain compression stockings. 12/25/16; the patient's wound is fully epithelialized. There is no open area here. The patient describes some continued episodic discomfort in this area medial left calf. However everything looks fine and healed here. She is been to elastic therapy and ELDEAN, KLATT ([id number]) caught herself 15-20 mmHg stockings, they apparently were having trouble getting 20-30 mm stockings in her size 01/22/17; this is a patient we discharged from the clinic a month ago. She has a recurrent open wound on her medial left calf. She had 15 mm support  stockings. I told her I thought she needed 20-30 mm compression stockings. She tells me that she has been ill with hospitalization secondary to asthma and is been found to have severe hypokalemia likely secondary to a combination of Lasix and metolazone. This morning she noted blistering and leaking fluid on the posterior part of her left leg. She called our intake nurse urgently and we was saw her this afternoon. She has not had any real discomfort here. I don't know that she's been wearing any stockings on this leg for at least 2-3 days. ABIs in this clinic were  1.21 on the right and 1.3 on the left. She is previously seen vascular surgery who does not think that there is a peripheral arterial issue. 01/30/17; Patient arrives with no open wound on the left leg. She has been to elastic therapy and obtained 20-58mmhg below knee stockings and she has one on the right leg today. READMISSION 02/19/18; this Mathurin is a now 73 year old patient we've had in this clinic perhaps 3 times before. I had last looked at her from January 07 December 2016 with an area on the medial left leg. We discharged her on 12/25/16 however she had to be readmitted on 01/22/17 with a recurrence. I have in my notes that we discharged her on 20-30 mm stockings although she tells me she was only wearing support hose because she cannot get stockings on predominantly related to her cervical spine surgery/issues. She has had previous ablations done by [facility] in [place] including a great saphenous vein ablation on the left with an anterior accessory branch ablation I think both of these were in 2016. On one of the previous visit she had a biopsy noted 2009 that was negative. She is not felt to have an arterial issue. She is not a diabetic. She does have a history of obstructive sleep apnea hypertension asthma as well as chronic venous insufficiency and lymphedema. On this occasion she noted 2 dry scaly patch on her left leg. She tried to put lotion on this it didn't really help. There were 2 open areas.the patient has been seeing her primary physician from 02/05/18 through 02/14/18. She had Unna boots applied. The superior wound now on the lateral left leg has closed but she's had one wound that remains open on the lateral left leg. This is not the same spot as we dealt with in 2018. ABIs in this clinic were 1.3 bilaterally 02/26/18; patient has a small wound on the left lateral calf. Dimensions are down. She has chronic venous insufficiency and lymphedema. 03/05/18; small open area on  the left lateral calf. Dimensions are down. Tightly adherent necrotic debris over the surface of the wound which was difficult to remove. Also the dressing [over collagen] stuck to the wound surface. This was removed with some difficulty as well. Change the primary dressing to Hydrofera Blue ready 03/12/18; small open area on the left lateral calf. Comes in with tightly adherent surface eschar as well as some adherent Hydrofera Blue. 03/19/18; open area on the left lateral calf. Again adherent surface eschar as well as some adherent Hydrofera Blue nonviable subcutaneous tissue. She complained of pain all week even with the reduction from 4-3 layer compression I put on last week. Also she had an increase in her ankle and calf measurements probably related to the same thing. 03/26/18; open area on the left lateral calf. A very small open area remains here. We used silver alginate starting last week as the  Hydrofera Blue seem to stick to the wound bed. In using 4-layer compression 04/02/18; the open area in the left lateral calf at some adherent slough which I removed there is no open area here. We are able to transition her into her own compression stocking. Truthfully I think this is probably his support hose. However this does not maintain skin integrity will be limited. She cannot put over the toe compression stockings on because of neck problems hand problems etc. She is allergic to the lining layer of juxta lites. We might be forced to use extremitease stocking should this fail READMIT 11/24/2018 Patient is now a 73 year old woman who is not a diabetic. She has been in this clinic on at least 3 previous occasions largely with recurrent wounds on her left leg secondary to chronic venous insufficiency with secondary lymphedema. Her situation is complicated by inability to get stockings on and an allergy to neoprene which is apparently a component and at least juxta lites and other stockings. As a  result she really has not been wearing any stockings on her legs. She tells [place] that roughly 2 or 3 weeks ago she started noticing a stinging sensation just above her ankle on the left medial aspect. She has been diagnosed with pseudogout and she wondered whether this was what she was experiencing. She tried to dress this with something she bought at the store however subsequently it pulled skin off and now she has an open wound that is not improving. She has been using Vaseline gauze with a cover bandage. She saw her primary doctor last week who put an Haematologist on her. ABIs in this clinic was 1.03 on the left ILINE, BUCHINGER. ([id number]) 2/12; the area is on the left medial ankle. Odd-looking wound with what looks to be surface epithelialization but a multitude of small petechial openings. This clearly not closed yet. We have been using silver alginate under 3 layer compression with TCA 2/19; the wound area did not look quite as good this week. Necrotic debris over the majority of the wound surface which required debridement. She continues to have a multitude of what looked to be small petechial openings. She reminds [place] that she had a biopsy on this initially during her first outbreak in 2015 in [facility]. She expresses concern about this being a possible melanoma. 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 I assured her that this does not look anything like melanoma. Except for the petechial reaction it does look like a venous insufficiency area and she certainly has evidence of this on both sides Objective Constitutional Sitting or standing Blood Pressure is within target range for patient.. Pulse regular and within target range for patient.Marland Kitchen Respirations regular, non-labored and within target range.. Temperature is normal and within the target range for the patient.Marland Kitchen appears in no  distress. Vitals Time Taken: 2:53 PM, Height: 63 in, Weight: 224.7 lbs, BMI: 39.8, Temperature: 98.1 F, Pulse: 60 bpm, Respiratory Rate: 16 breaths/min, Blood Pressure: 138/70 mmHg. General Notes: Wound exam; the patient's wound had adherent debris that I removed with a #3 curette. She continues to have an almost petechial looking rash around this. This apparently has been chronic. Hemostasis with direct pressure. Integumentary (Hair, Skin) Wound #5 status is Open. Original cause of wound was Gradually Appeared. The wound is located on the Left,Medial Lower Leg. The wound measures 5cm length x 4cm width x 0.1cm depth;  15.708cm^2 area and 1.571cm^3 volume. There is Fat Layer (Subcutaneous Tissue) Exposed exposed. There is no tunneling or undermining noted. There is a medium amount of serosanguineous drainage noted. The wound margin is indistinct and nonvisible. There is medium (34-66%) red, pink, hyper - granulation within the wound bed. There is a medium (34-66%) amount of necrotic tissue within the wound bed including Eschar and Adherent Slough. The periwound skin appearance exhibited: Maceration, Hemosiderin Staining. The periwound skin appearance did not exhibit: Callus, Crepitus, Excoriation, Induration, Rash, Scarring, Dry/Scaly, Atrophie Blanche, Cyanosis, Ecchymosis, Mottled, Pallor, Rubor, Erythema. Periwound temperature was noted as No Abnormality. The periwound has tenderness on palpation. Assessment Active Problems ICD-10 Non-pressure chronic ulcer of right calf limited to breakdown of skin Chronic venous hypertension (idiopathic) with inflammation of right lower extremity Lymphedema, not elsewhere classified BLAIRE, PALOMINO. ([id number]) Procedures Wound #5 Pre-procedure diagnosis of Wound #5 is a Lymphedema located on the Left,Medial Lower Leg . There was a Excisional Skin/Subcutaneous Tissue Debridement with a total area of 20 sq cm performed by [name] Dillon, MD. With  the following instrument(s): Curette to remove Viable and Non-Viable tissue/material. Material removed includes Subcutaneous Tissue and Slough and after achieving pain control using Lidocaine. No specimens were taken. A time out was conducted at 15:29, prior to the start of the procedure. A Moderate amount of bleeding was controlled with Pressure. The procedure was tolerated well. Post Debridement Measurements: 5cm length x 4cm width x 0.2cm depth; 3.142cm^3 volume. Character of Wound/Ulcer Post Debridement requires further debridement. Post procedure Diagnosis Wound #5: Same as Pre-Procedure Plan Wound Cleansing: Wound #5 Left,Medial Lower Leg: Clean wound with Normal Saline. May Shower, gently pat wound dry prior to applying new dressing. Anesthetic (add to Medication List): Wound #5 Left,Medial Lower Leg: Topical Lidocaine 4% cream applied to wound bed prior to debridement (In Clinic Only). Benzocaine Topical Anesthetic Spray applied to wound bed prior to debridement (In Clinic Only). Skin Barriers/Peri-Wound Care: Wound #5 Left,Medial Lower Leg: Triamcinolone Acetonide Ointment (TCA) - on wound and peri-wound Primary Wound Dressing: Wound #5 Left,Medial Lower Leg: Hydrafera Blue Ready Transfer Secondary Dressing: Wound #5 Left,Medial Lower Leg: ABD pad Dressing Change Frequency: Wound #5 Left,Medial Lower Leg: Change dressing every week Follow-up Appointments: Wound #5 Left,Medial Lower Leg: Return Appointment in 1 week. Nurse Visit as needed Edema Control: Wound #5 Left,Medial Lower Leg: 3 Layer Compression System - Left Lower Extremity 1. I do not believe this patient has melanoma and I am not even thinking that this represents malignancy. 1 would wonder about livedoid vasculopathy. I would not do a punch biopsy on this area for fear that that would create a bigger problem than she already has but I could do a shave biopsy and I did discuss that with her today 2. We been  able to get this area thick closed I think at least 2 or 3 times in the past and I am going to work on it from that [place] ([id number]) angle. I am not exactly sure what the small petechial areas are caused by however. I do not see any other evidence of this systemically Electronic Signature(s) Signed: 12/24/2018 6:18:32 PM By: Linton Ham MD Entered By: Linton Ham on 12/24/2018 17:55:22 Brach, [name] ([id number]) -------------------------------------------------------------------------------- SuperBill Details Patient Name: TYTIANA, COLES. Date of Service: 12/24/2018 Medical Record Number: [id number] Patient Account Number: [id number] Date of Birth/Sex: [birth year] (73 y.o. F) Treating RN: Cornell Barman Primary Care Provider: [name] Other Clinician: Referring Provider: [name] Treating  Provider/Extender: [name] Dillon Weeks in Treatment: 2 Diagnosis Coding ICD-10 Codes Code Description [phone] Non-pressure chronic ulcer of right calf limited to breakdown of skin I87.321 Chronic venous hypertension (idiopathic) with inflammation of right lower extremity I89.0 Lymphedema, not elsewhere classified Facility Procedures CPT4 Code: 71696789 Description: 38101 - DEB SUBQ TISSUE 20 SQ CM/< ICD-10 Diagnosis Description L97.211 Non-pressure chronic ulcer of right calf limited to breakdow Modifier: n of skin Quantity: 1 Physician Procedures CPT4 Code: 7510258 Description: 52778 - WC PHYS SUBQ TISS 20 SQ CM ICD-10 Diagnosis Description L97.211 Non-pressure chronic ulcer of right calf limited to breakdow Modifier: n of skin Quantity: 1 Electronic Signature(s) Signed: 12/24/2018 6:18:32 PM By: Linton Ham MD Entered By: Linton Ham on 12/24/2018 17:55:47

## 2018-12-29 DIAGNOSIS — J45909 Unspecified asthma, uncomplicated: Secondary | ICD-10-CM | POA: Diagnosis not present

## 2018-12-29 DIAGNOSIS — L97211 Non-pressure chronic ulcer of right calf limited to breakdown of skin: Secondary | ICD-10-CM | POA: Diagnosis not present

## 2018-12-29 DIAGNOSIS — G4733 Obstructive sleep apnea (adult) (pediatric): Secondary | ICD-10-CM | POA: Diagnosis not present

## 2018-12-29 DIAGNOSIS — E876 Hypokalemia: Secondary | ICD-10-CM | POA: Diagnosis not present

## 2018-12-29 DIAGNOSIS — E11622 Type 2 diabetes mellitus with other skin ulcer: Secondary | ICD-10-CM | POA: Diagnosis not present

## 2018-12-29 DIAGNOSIS — I10 Essential (primary) hypertension: Secondary | ICD-10-CM | POA: Diagnosis not present

## 2018-12-30 NOTE — Progress Notes (Signed)
Amber, [name] ([id number]) Visit Report for 12/29/2018 Arrival Information Details Patient Name: [name], [name]. Date of Service: 12/29/2018 1:45 PM Medical Record Number: [id number] Patient Account Number: [id number] Date of Birth/Sex: [birth year] (73 y.o. F) Treating RN: Montey Hora Primary Care Khristin [name]: [name] Other Clinician: Referring Melesio Madara: [name] Treating Makayleigh Poliquin/Extender: Melburn Hake, HOYT Weeks in Treatment: 2 Visit Information History Since Last Visit Added or deleted any medications: No Patient Arrived: Ambulatory Any new allergies or adverse reactions: No Arrival Time: 14:00 Had a fall or experienced change in No Accompanied By: self activities of daily living that may affect Transfer Assistance: None risk of falls: Patient Identification Verified: Yes Signs or symptoms of abuse/neglect since last visito No Secondary Verification Process Completed: Yes Hospitalized since last visit: No Implantable device outside of the clinic excluding No cellular tissue based products placed in the center since last visit: Has Dressing in Place as Prescribed: Yes Has Compression in Place as Prescribed: Yes Pain Present Now: No Electronic Signature(s) Signed: 12/29/2018 4:43:43 PM By: Montey Hora Entered By: Montey Hora on 12/29/2018 14:00:28 Ligman, [name] ([id number]) -------------------------------------------------------------------------------- Compression Therapy Details Patient Name: Amber, [name]. Date of Service: 12/29/2018 1:45 PM Medical Record Number: [id number] Patient Account Number: [id number] Date of Birth/Sex: [birth year] (73 y.o. F) Treating RN: Montey Hora Primary Care Iniko Robles: [name] Other Clinician: Referring Taunia Frasco: [name] Treating Jemuel Laursen/Extender: Melburn Hake, HOYT Weeks in Treatment: 2 Compression Therapy Performed for Wound Assessment: Wound #5 Left,Medial Lower Leg Performed By: Clinician  Montey Hora, RN Compression Type: Three Layer Pre Treatment ABI: 1 Electronic Signature(s) Signed: 12/29/2018 4:43:43 PM By: Montey Hora Entered By: Montey Hora on 12/29/2018 14:01:05 ARSHI, DUARTE ([id number]) -------------------------------------------------------------------------------- Encounter Discharge Information Details Patient Name: Amber, [name]. Date of Service: 12/29/2018 1:45 PM Medical Record Number: [id number] Patient Account Number: [id number] Date of Birth/Sex: 04-29-1946 (73 y.o. F) Treating RN: Montey Hora Primary Care Niang Mitcheltree: [name] Other Clinician: Referring Mickaela Starlin: [name] Treating Aicha Clingenpeel/Extender: Melburn Hake, HOYT Weeks in Treatment: 2 Encounter Discharge Information Items Discharge Condition: Stable Ambulatory Status: Ambulatory Discharge Destination: Home Transportation: Private Auto Accompanied By: self Schedule Follow-up Appointment: Yes Clinical Summary of Care: Electronic Signature(s) Signed: 12/29/2018 4:43:43 PM By: Montey Hora Entered By: Montey Hora on 12/29/2018 14:01:58 Lantzy, [name] ([id number]) -------------------------------------------------------------------------------- Patient/Caregiver Education Details Patient Name: Amber, [name]. Date of Service: 12/29/2018 1:45 PM Medical Record Number: [id number] Patient Account Number: [id number] Date of Birth/Gender: 1945/11/29 (73 y.o. F) Treating RN: Montey Hora Primary Care Physician: [name] Other Clinician: Referring Physician: [name] Treating Physician/Extender: Sharalyn Ink in Treatment: 2 Education Assessment Education Provided To: Patient Education Topics Provided Venous: Handouts: Other: leg elevation and the need for ongoing compression Methods: Explain/Verbal Responses: State content correctly Electronic Signature(s) Signed: 12/29/2018 4:43:43 PM By: Montey Hora Entered By: Montey Hora on  12/29/2018 14:01:49 Fieldhouse, [name] ([id number]) -------------------------------------------------------------------------------- Wound Assessment Details Patient Name: Amber, [name]. Date of Service: 12/29/2018 1:45 PM Medical Record Number: [id number] Patient Account Number: [id number] Date of Birth/Sex: [birth year] (73 y.o. F) Treating RN: Montey Hora Primary Care Emmy Keng: [name] Other Clinician: Referring Jahmiya Guidotti: [name] Treating Mickel Schreur/Extender: STONE III, HOYT Weeks in Treatment: 2 Wound Status Wound Number: 5 Primary Lymphedema Etiology: Wound Location: Left Lower Leg - Medial Wound Open Wounding Event: Gradually Appeared Status: Date Acquired: 11/19/2018 Comorbid Cataracts, Asthma, Sleep Apnea, Deep Vein Weeks Of Treatment: 2 History: Thrombosis, Hypertension, Peripheral Venous Clustered Wound: No Disease, Osteoarthritis, Received Chemotherapy, Received Radiation Wound Measurements Length: (  cm) 5 Width: (cm) 4 Depth: (cm) 0.1 Area: (cm) 15.708 Volume: (cm) 1.571 % Reduction in Area: -83.8% % Reduction in Volume: -83.7% Epithelialization: Small (1-33%) Tunneling: No Undermining: No Wound Description Full Thickness Without Exposed Support Classification: Structures Wound Margin: Indistinct, nonvisible Exudate Medium Amount: Exudate Type: Serosanguineous Exudate Color: red, brown Foul Odor After Cleansing: No Slough/Fibrino Yes Wound Bed Granulation Amount: Medium (34-66%) Exposed Structure Granulation Quality: Red, Pink, Hyper-granulation Fascia Exposed: No Necrotic Amount: Medium (34-66%) Fat Layer (Subcutaneous Tissue) Exposed: Yes Necrotic Quality: Eschar, Adherent Slough Tendon Exposed: No Muscle Exposed: No Joint Exposed: No Bone Exposed: No Periwound Skin Texture Texture Color No Abnormalities Noted: No No Abnormalities Noted: No Callus: No Atrophie Blanche: No Crepitus: No Cyanosis: No Excoriation:  No Ecchymosis: No Induration: No Erythema: No Rash: No Hemosiderin Staining: Yes Scarring: No Mottled: No Pallor: No Moisture Rubor: No No Abnormalities Noted: No Amber, [name] ([id number]) Dry / Scaly: No Temperature / Pain Maceration: Yes Temperature: No Abnormality Tenderness on Palpation: Yes Wound Preparation Ulcer Cleansing: Rinsed/Irrigated with Saline, Other: soap and water, Topical Anesthetic Applied: None Treatment Notes Wound #5 (Left, Medial Lower Leg) Notes hydrafera blue, abd, 3-Layer with unna to anchor Electronic Signature(s) Signed: 12/29/2018 4:43:43 PM By: Montey Hora Entered By: Montey Hora on 12/29/2018 14:00:46

## 2018-12-31 ENCOUNTER — Encounter: Payer: Medicare Other | Admitting: Internal Medicine

## 2018-12-31 DIAGNOSIS — L97211 Non-pressure chronic ulcer of right calf limited to breakdown of skin: Secondary | ICD-10-CM | POA: Diagnosis not present

## 2018-12-31 DIAGNOSIS — G4733 Obstructive sleep apnea (adult) (pediatric): Secondary | ICD-10-CM | POA: Diagnosis not present

## 2018-12-31 DIAGNOSIS — I87392 Chronic venous hypertension (idiopathic) with other complications of left lower extremity: Secondary | ICD-10-CM | POA: Diagnosis not present

## 2018-12-31 DIAGNOSIS — S91002A Unspecified open wound, left ankle, initial encounter: Secondary | ICD-10-CM | POA: Diagnosis not present

## 2018-12-31 DIAGNOSIS — I890 Lymphedema, not elsewhere classified: Secondary | ICD-10-CM | POA: Diagnosis not present

## 2018-12-31 DIAGNOSIS — I10 Essential (primary) hypertension: Secondary | ICD-10-CM | POA: Diagnosis not present

## 2018-12-31 DIAGNOSIS — J45909 Unspecified asthma, uncomplicated: Secondary | ICD-10-CM | POA: Diagnosis not present

## 2018-12-31 DIAGNOSIS — E876 Hypokalemia: Secondary | ICD-10-CM | POA: Diagnosis not present

## 2018-12-31 DIAGNOSIS — E11622 Type 2 diabetes mellitus with other skin ulcer: Secondary | ICD-10-CM | POA: Diagnosis not present

## 2019-01-01 ENCOUNTER — Encounter: Payer: Self-pay | Admitting: Pulmonary Disease

## 2019-01-01 ENCOUNTER — Ambulatory Visit (INDEPENDENT_AMBULATORY_CARE_PROVIDER_SITE_OTHER): Payer: Medicare Other | Admitting: Pulmonary Disease

## 2019-01-01 VITALS — BP 132/90 | HR 70 | Ht 63.0 in | Wt 225.0 lb

## 2019-01-01 DIAGNOSIS — J301 Allergic rhinitis due to pollen: Secondary | ICD-10-CM | POA: Diagnosis not present

## 2019-01-01 DIAGNOSIS — G4733 Obstructive sleep apnea (adult) (pediatric): Secondary | ICD-10-CM

## 2019-01-01 DIAGNOSIS — K219 Gastro-esophageal reflux disease without esophagitis: Secondary | ICD-10-CM

## 2019-01-01 DIAGNOSIS — J4531 Mild persistent asthma with (acute) exacerbation: Secondary | ICD-10-CM

## 2019-01-01 NOTE — Progress Notes (Signed)
Subjective:   PATIENT ID: [name] GENDER: female DOB: Apr 29, 1946, MRN: [id number]  Synopsis: Former patient of Dr. Elsworth [name] with asthma an OSA, allergic rhinitis HPI  Chief Complaint  Patient presents with  . Follow-up    Pt is doing well overall with cpap machine.   [name] has a runny nose and scratchy eyes that "won't go away".  She was using flonase for a few weeks and this helped. She says the left side is typicaly more stopped up than the right.  She says that her sinus symptoms came back after a few week.  No sore throat or a cough. She was prescribed astelin a couple of weeks and it helps some.  She still takes her montelukast.    Past Medical History:  Diagnosis Date  . Allergy   . Anesthesia complication    trouble waking up 2/2 CPAP  . Arthritis   . Asthma in adult   . Cataract 2019   left eye resolved with surgery  . Cervical spondylosis 2012   [name], [name])  . Choroidal nevus of left eye 03/22/2016  . Chronic venous insufficiency 2016   severe reflux with painful varicosities sees [facility]  . Clotting disorder ([name])    due to vein ablation   . Complication of anesthesia    difficulty coming out due to sleep apnea per pt  . Difficult intubation    PATIENT DENIES   . Diverticulosis    per ct scans 09-2016 and 01-2017  . DVT (deep venous thrombosis) ([name]) 2011   small, developed after venous ablation  . DVT (deep venous thrombosis) ([name])   . Family history of adverse reaction to anesthesia    O2 levels drop upon waking   . Hearing loss sensory, bilateral 2013   mod-severe high freq sensorineural ([name])  . Hiatal hernia     09-2016 ct scan [facility] at [facility]   . History of kidney stones 1980s  . History of radiation therapy 07/19/11-08/25/2011   RIGHT AXILLARY REGION/METASTATIC  . Hypertension   . Melanoma ([name]) 06/29/10   MALIGNANT MELANOMA R SHOULDER/SUPRASCAPULAR BACK s/p interferon chemo and XRT  . Neuromuscular disorder ([facility])    muscle  spasms  . OSA (obstructive sleep apnea)    on CPAP  . Pneumonia    2001  . RLS (restless legs syndrome)   . Seasonal allergies   . Sleep apnea    wears cpap  . Tinnitus   . Unspecified vitamin D deficiency 03/18/2013  . Varicose veins of left lower extremity        Review of Systems  Constitutional: Negative for malaise/fatigue and weight loss.  HENT: Positive for congestion. Negative for sinus pain and sore throat.   Respiratory: Positive for wheezing. Negative for cough and sputum production.   Cardiovascular: Negative for claudication, leg swelling and PND.  Skin: Negative for itching and rash.  Neurological: Negative for weakness.      Objective:  Physical Exam   Vitals:   01/01/19 1432 01/01/19 1433  BP:  132/90  Pulse:  70  SpO2:  97%  Weight: 225 lb (102.1 kg)   Height: 5\' 3"  (1.6 m)     RA  Gen: well appearing HENT: OP clear, TM's clear, neck supple PULM: CTA B, normal percussion CV: RRR, no mgr, trace edema GI: BS+, soft, nontender Derm: no cyanosis or rash Psyche: normal mood and affect     CBC    Component Value Date/Time  WBC 6.1 08/14/2018 1013   WBC 6.7 04/15/2018 1429   RBC 4.38 08/14/2018 1013   HGB 13.0 08/14/2018 1013   HGB 13.2 08/15/2017 1039   HGB 12.8 10/21/2012   HCT 42.1 08/14/2018 1013   HCT 40.5 08/15/2017 1039   PLT 197 08/14/2018 1013   PLT 196 08/15/2017 1039   MCV 96.1 08/14/2018 1013   MCV 94 08/15/2017 1039   MCH 29.7 08/14/2018 1013   MCHC 30.9 08/14/2018 1013   RDW 12.4 08/14/2018 1013   RDW 13.5 08/15/2017 1039   LYMPHSABS 1.8 08/14/2018 1013   LYMPHSABS 2.0 08/15/2017 1039   MONOABS 0.4 08/14/2018 1013   EOSABS 0.3 08/14/2018 1013   EOSABS 0.2 08/15/2017 1039   BASOSABS 0.0 08/14/2018 1013   BASOSABS 0.0 08/15/2017 1039    Sleep study: PSG 2003 which showed AHI of 81 events per hour and lowest desaturation to 57%. This was corrected by CPAP of 12 cm in with residual events of 5.6 events per hour on a  study in 2007. CPAP 11/05/13 to 03/24/14>>used on 140 of 140 nights with average 4 hrs and 36 min. Average AHI is 6.5 with median CPAP 12 cm H2O.   PFT: PFTs 04/2014- no airway obs, ratio ok, FEV1 80%, TLC &DLCO nml   July 27, 2018 CPAP compliance report shows compliance with 100% of days 01/01/2019 CPAP Compliance report shows compliance100% of days     Assessment & Plan:   OSA (obstructive sleep apnea)  Mild persistent asthma with exacerbation  Seasonal allergic rhinitis due to pollen  Gastroesophageal reflux disease, esophagitis presence not specified  Discussion: This has been a stable interval for [name] but she still struggles with allergic rhinitis.  I think she should see Dr. Ernst [name] to assess this problem further.  She has been stable from a OSA and asthma standpoint.    Obstructive sleep apnea: > continue CPAP nightly > no driving while sleepy  Allergic rhinitis; > continue Zyrtec > Continue montelukast > Use Flonase 1 puff each nostril daily > Continue Astelin > We will refer you to Dr. Ernst [name] for further evaluation and consideration of immunotherapy if necessary  Asthma: > Keep using albuterol as needed for chest tightness wheezing or shortness of breath > If you have to take prednisone for a flareup of your asthma please let me know  We will see you back in 6 months or sooner if needed  Greater than 50% of this 25-minute visit was spent face-to-face    Current Outpatient Medications:  .  acetaminophen (TYLENOL ARTHRITIS PAIN) 650 MG CR tablet, Take 1,300 mg by mouth every 8 (eight) hours as needed for pain. , Disp: , Rfl:  .  albuterol (PROVENTIL HFA;VENTOLIN HFA) 108 (90 Base) MCG/ACT inhaler, INHALE 2 PUFFS INTO LUNGS EVERY 6 HOURS AS NEEDED FOR WHEEZING OR SHORTNESS OF BREATH, Disp: 8.5 Inhaler, Rfl: 3 .  albuterol (PROVENTIL) (2.5 MG/3ML) 0.083% nebulizer solution, Take 3 mLs (2.5 mg total) by nebulization every 6 (six) hours as needed for  wheezing or shortness of breath., Disp: 150 mL, Rfl: 1 .  aspirin EC 81 MG tablet, Take 81 mg by mouth at bedtime. Melanoma prevention, Disp: , Rfl:  .  azelastine (ASTELIN) 0.1 % nasal spray, Place 1 spray into both nostrils 2 (two) times daily. Use in each nostril as directed, Disp: 30 mL, Rfl: 3 .  cetirizine (ZYRTEC) 10 MG tablet, Take 10 mg by mouth daily., Disp: , Rfl:  .  famotidine (PEPCID) 10 MG tablet,  Take 10 mg by mouth daily., Disp: , Rfl:  .  furosemide (LASIX) 40 MG tablet, Take 1 tablet (40 mg total) by mouth daily., Disp: 30 tablet, Rfl: 11 .  gabapentin (NEURONTIN) 100 MG capsule, TAKE 1 CAPSULE BY MOUTH AT BEDTIME, Disp: 30 capsule, Rfl: 6 .  losartan (COZAAR) 50 MG tablet, TAKE 1 TABLET EVERY DAY, Disp: 30 tablet, Rfl: 5 .  meloxicam (MOBIC) 15 MG tablet, Take 15 mg by mouth daily. As needed, Disp: , Rfl: 3 .  montelukast (SINGULAIR) 10 MG tablet, TAKE 1 TABLET BY MOUTH DAILY, Disp: 30 tablet, Rfl: 2 .  Multiple Vitamin (MULTIVITAMIN WITH MINERALS) TABS tablet, Take 1 tablet by mouth daily., Disp: , Rfl:  .  Polyvinyl Alcohol-Povidone (TEARS PLUS OP), Place 1 drop into both eyes daily as needed (dry eyes/ redness/ burning)., Disp: , Rfl:  .  potassium chloride SA (KLOR-CON M20) 20 MEQ tablet, Take 1 tablet (20 mEq total) by mouth 2 (two) times daily., Disp: 60 tablet, Rfl: 11 .  PRESCRIPTION MEDICATION, CPAP, Disp: , Rfl:  .  Probiotic Product (PROBIOTIC DAILY PO), Take 1 tablet by mouth daily. , Disp: , Rfl:  .  triamcinolone (NASACORT ALLERGY 24HR) 55 MCG/ACT AERO nasal inhaler, Place 2 sprays into the nose daily. In each nostril, Disp: , Rfl:  .  UNABLE TO FIND, Take by mouth., Disp: , Rfl:  .  Vitamin D, Ergocalciferol, (DRISDOL) 1.25 MG (50000 UT) CAPS capsule, TAKE 1 CAPSULE (50,000 UNITS TOTAL) BY MOUTH EVERY 7 (SEVEN) DAYS., Disp: 12 capsule, Rfl: 3

## 2019-01-01 NOTE — Addendum Note (Signed)
Addended by: Georjean Mode on: 01/01/2019 04:22 PM   Modules accepted: Orders

## 2019-01-01 NOTE — Progress Notes (Signed)
[name], [name] ([id number]) Visit Report for 12/31/2018 HPI Details Patient Name: [name], [name]. Date of Service: 12/31/2018 9:30 AM Medical Record Number: [id number] Patient Account Number: [id number] Date of Birth/Sex: [birth year] (73 y.o. F) Treating RN: Cornell Barman Primary Care Provider: [name] Other Clinician: Referring Provider: [name] Treating Provider/Extender: Tito Dine in Treatment: 3 History of Present Illness HPI Description: Pleasant 73 year old with history of chronic venous insufficiency. No diabetes or peripheral vascular disease. Left ABI 1.29. Questionable history of left lower extremity DVT. She developed a recurrent ulceration on her left lateral calf in December 2015, which she attributes to poor diet and subsequent lower extremity edema. She underwent endovenous laser ablation of her left greater saphenous vein in 2010. She underwent laser ablation of accessory branch of left GSV in April 2016 by Dr. Kellie Simmering at [facility]. She was previously wearing Unna boots, which she tolerated well. Tolerating 2 layer compression and cadexomer iodine. She returns to clinic for follow-up and is without new complaints. She denies any significant pain at this time. She reports persistent pain with pressure. No claudication or ischemic rest pain. No fever or chills. No drainage. READMISSION 11/13/16; this is a 73 year old woman who is not a diabetic. She is here for a review of a painful area on her left medial lower extremity. I note that she was seen here previously last year for wound I believe to be in the same area. At that time she had undergone previously a left greater saphenous vein ablation by Dr. Kellie Simmering and she had a ablation of the anterior accessory branch of the left greater saphenous vein in March 2016. Seeing that the wound actually closed over. In reviewing the history with her today the ulcer in this area has been recurrent. She describes  a biopsy of this area in 2009 that only showed stasis physiology. 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 Do not see that she has ever had arterial studies in the left leg. She tells me she has a follow-up with Dr. Kellie Simmering in roughly 10 days In any case she developed the reopening of this area roughly a month ago. On the background of this she describes rapidly increasing edema which has responded to Lasix 40 mg and metolazone 2.5 mg as well as the patient's lymph massage. She has been told she has both venous insufficiency and lymphedema but she cannot tolerate compression stockings 11/28/16; the patient saw Dr. Kellie Simmering recently. Per the patient he did arterial Dopplers in the office that did not show evidence of arterial insufficiency, per the patient he stated "treat this like an ordinary venous ulcer". She also saw her dermatologist Dr. Ronnald Ramp who felt that this was more of a vascular ulcer. In general things are improving although she arrives today with increasing bilateral lower extremity edema with weeping a deeper fluid through the wound on the left medial leg compatible with some degree of lymphedema 12/04/16; the patient's wound is fully epithelialized but I don't think fully healed. We will do another week of depression with Promogran and TCA however I suspect we'll be able to discharge her next week. This is a very unusual-looking wound which was initially a figure-of-eight type wound lying on its side surrounded by petechial like hemorrhage. She  has had venous ablation on this side. She apparently does not have an arterial issue per Dr. Kellie Simmering. She saw her dermatologist thought it was "vascular". Patient is definitely going to need ongoing compression and I talked about  this with her today she will go to elastic therapy after she leaves here next week 12/11/16; the patient's wound is not completely closed today. She has surrounding scar tissue and in further discussion with the patient it would appear that she had ulcers in this area in 2009 for a prolonged period of time ultimately requiring a punch biopsy of this area that only showed venous insufficiency. I did not previously pickup on this part of the history from the patient. 12/18/16; the patient's wound is completely epithelialized. There is no open area here. She has significant bilateral venous insufficiency with secondary lymphedema to a mild-to-moderate degree she does not have compression stockings.. She did not say anything to me when I was in the room, she told our intake nurse that she was still having pain in this area. This isn't TONY, SCHURIG ([id number]) unusual recurrent small open area. She is going to go to elastic therapy to obtain compression stockings. 12/25/16; the patient's wound is fully epithelialized. There is no open area here. The patient describes some continued episodic discomfort in this area medial left calf. However everything looks fine and healed here. She is been to elastic therapy and caught herself 15-20 mmHg stockings, they apparently were having trouble getting 20-30 mm stockings in her size 01/22/17; this is a patient we discharged from the clinic a month ago. She has a recurrent open wound on her medial left calf. She had 15 mm support stockings. I told her I thought she needed 20-30 mm compression stockings. She tells me that she has been ill with hospitalization secondary to asthma and is been found to have severe hypokalemia likely secondary to a combination of Lasix and metolazone. This morning she noted blistering and leaking fluid on the posterior part of her left leg. She called our intake nurse urgently and we was saw her this afternoon. She has not had any real  discomfort here. I don't know that she's been wearing any stockings on this leg for at least 2-3 days. ABIs in this clinic were 1.21 on the right and 1.3 on the left. She is previously seen vascular surgery who does not think that there is a peripheral arterial issue. 01/30/17; Patient arrives with no open wound on the left leg. She has been to elastic therapy and obtained 20-2mmhg below knee stockings and she has one on the right leg today. READMISSION 02/19/18; this Ririe is a now 73 year old patient we've had in this clinic perhaps 3 times before. I had last looked at her from January 07 December 2016 with an area on the medial left leg. We discharged her on 12/25/16 however she had to be readmitted on 01/22/17 with a recurrence. I have in my notes that we discharged her on 20-30 mm stockings although she tells me she was only wearing support hose because she cannot get stockings on predominantly related to her cervical spine surgery/issues. She has had previous ablations done by vein and vascular in [place] including a great saphenous vein ablation on the left with an anterior accessory branch ablation I think both of these were in 2016. On one of the previous visit she had a biopsy noted 2009 that was negative. She is not felt to have an arterial issue. She is not  a diabetic. She does have a history of obstructive sleep apnea hypertension asthma as well as chronic venous insufficiency and lymphedema. On this occasion she noted 2 dry scaly patch on her left leg. She tried to put lotion on this it didn't really help. There were 2 open areas.the patient has been seeing her primary physician from 02/05/18 through 02/14/18. She had Unna boots applied. The superior wound now on the lateral left leg has closed but she's had one wound that remains open on the lateral left leg. This is not the same spot as we dealt with in 2018. ABIs in this clinic were 1.3 bilaterally 02/26/18; patient has a small wound  on the left lateral calf. Dimensions are down. She has chronic venous insufficiency and lymphedema. 03/05/18; small open area on the left lateral calf. Dimensions are down. Tightly adherent necrotic debris over the surface of the wound which was difficult to remove. Also the dressing [over collagen] stuck to the wound surface. This was removed with some difficulty as well. Change the primary dressing to Hydrofera Blue ready 03/12/18; small open area on the left lateral calf. Comes in with tightly adherent surface eschar as well as some adherent Hydrofera Blue. 03/19/18; open area on the left lateral calf. Again adherent surface eschar as well as some adherent Hydrofera Blue nonviable subcutaneous tissue. She complained of pain all week even with the reduction from 4-3 layer compression I put on last week. Also she had an increase in her ankle and calf measurements probably related to the same thing. 03/26/18; open area on the left lateral calf. A very small open area remains here. We used silver alginate starting last week as the Hydrofera Blue seem to stick to the wound bed. In using 4-layer compression 04/02/18; the open area in the left lateral calf at some adherent slough which I removed there is no open area here. We are able to transition her into her own compression stocking. Truthfully I think this is probably his support hose. However this does not maintain skin integrity will be limited. She cannot put over the toe compression stockings on because of neck problems hand problems etc. She is allergic to the lining layer of juxta lites. We might be forced to use extremitease stocking should this fail READMIT 11/24/2018 Patient is now a 73 year old woman who is not a diabetic. She has been in this clinic on at least 3 previous occasions largely with recurrent wounds on her left leg secondary to chronic venous insufficiency with secondary lymphedema. Her situation is complicated by inability to get  stockings on and an allergy to neoprene which is apparently a component and at least juxta lites and other stockings. As a result she really has not been wearing any stockings on her legs. She tells [place] that roughly 2 or 3 weeks ago she started noticing a stinging sensation just above her ankle on the left medial aspect. She has been diagnosed with pseudogout and she wondered whether this was what she was experiencing. She tried to dress this with something she bought at the store however subsequently it pulled skin off and now she has an open wound that is not improving. She has been using Vaseline gauze with a cover bandage. She saw her primary doctor last week who [name], [name] put an Unna boot on her. ABIs in this clinic was 1.03 on the left 2/12; the area is on the left medial ankle. Odd-looking wound with what looks to be surface epithelialization  but a multitude of small petechial openings. This clearly not closed yet. We have been using silver alginate under 3 layer compression with TCA 2/19; the wound area did not look quite as good this week. Necrotic debris over the majority of the wound surface which required debridement. She continues to have a multitude of what looked to be small petechial openings. She reminds [place] that she had a biopsy on this initially during her first outbreak in 2015 in [place] dermatology. She expresses concern about this being a possible melanoma. 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 I assured her that this does not look anything like melanoma. Except for the petechial reaction it does look like a venous insufficiency area and she certainly has evidence of this on both sides 2/26; a difficult area on the left medial ankle. The patient clearly has chronic venous hypertension with some degree of lymphedema. The odd thing about the area is the small  petechial hemorrhages. I am not really sure how to explain this. This was present last time and this is not a compression injury. We have been using Hydrofera Blue which I changed to last week Electronic Signature(s) Signed: 12/31/2018 4:42:30 PM By: Linton Ham MD Entered By: Linton Ham on 12/31/2018 10:43:26 Diles, [name] ([id number]) -------------------------------------------------------------------------------- Physical Exam Details Patient Name: MARISE, KNAPPER. Date of Service: 12/31/2018 9:30 AM Medical Record Number: [id number] Patient Account Number: [id number] Date of Birth/Sex: October 06, 1946 (73 y.o. F) Treating RN: Cornell Barman Primary Care Provider: [name] Other Clinician: Referring Provider: [name] Treating Provider/Extender: Tito Dine in Treatment: 3 Constitutional Sitting or standing Blood Pressure is within target range for patient.. Pulse regular and within target range for patient.Marland Kitchen Respirations regular, non-labored and within target range.. Temperature is normal and within the target range for the patient.Marland Kitchen appears in no distress. Eyes Conjunctivae clear. No discharge. Notes Wound exam; the patient's wound had less adherent debris. No debridement was required. The petechial area is still present I do not have a good explanation for this. This extends up well away from the wound area. Some features of what looks to be atrophie blanche. Clearly has venous hypertension with dilated small subdermal venules Electronic Signature(s) Signed: 12/31/2018 4:42:30 PM By: Linton Ham MD Entered By: Linton Ham on 12/31/2018 10:45:25 Fortson, [name] ([id number]) -------------------------------------------------------------------------------- Physician Orders Details Patient Name: SARENA, JEZEK. Date of Service: 12/31/2018 9:30 AM Medical Record Number: [id number] Patient Account Number: [id number] Date of Birth/Sex: May 07, 1946 (73  y.o. F) Treating RN: Cornell Barman Primary Care Provider: [name] Other Clinician: Referring Provider: [name] Treating Provider/Extender: Tito Dine in Treatment: 3 Verbal / Phone Orders: No Diagnosis Coding Wound Cleansing Wound #5 Left,Medial Lower Leg o Clean wound with Normal Saline. o May Shower, gently pat wound dry prior to applying new dressing. Anesthetic (add to Medication List) Wound #5 Left,Medial Lower Leg o Topical Lidocaine 4% cream applied to wound bed prior to debridement (In Clinic Only). Skin Barriers/Peri-Wound Care Wound #5 Left,Medial Lower Leg o Triamcinolone Acetonide Ointment (TCA) - on wound and peri-wound Primary Wound Dressing Wound #5 Left,Medial Lower Leg o Hydrafera Blue Ready Transfer Secondary Dressing Wound #5 Left,Medial Lower Leg o ABD pad Dressing Change Frequency Wound #5 Left,Medial Lower Leg o Change dressing every week Follow-up Appointments Wound #5 Left,Medial Lower Leg o Return Appointment in 1 week. o Nurse Visit as needed Edema Control Wound #5 Left,Medial  Lower Leg o 3 Layer Compression System - Left Lower Extremity Electronic Signature(s) Signed: 12/31/2018 4:42:30 PM By: Linton Ham MD Signed: 12/31/2018 5:55:39 PM By: [name], [name], RN, CWS, [name], [name] Entered By: [name], [name], RN, CWS, Kim on 12/31/2018 10:02:28 [name], [name] ([id number], [name] ([id number]) -------------------------------------------------------------------------------- Problem List Details Patient Name: [name], [name]. Date of Service: 12/31/2018 9:30 AM Medical Record Number: [id number] Patient Account Number: [id number] Date of Birth/Sex: Aug 16, 1946 (73 y.o. F) Treating RN: Cornell Barman Primary Care Provider: [name] Other Clinician: Referring Provider: [name] Treating Provider/Extender: Tito Dine in Treatment: 3 Active Problems ICD-10 Evaluated  Encounter Code Description Active Date Today Diagnosis L97.211 Non-pressure chronic ulcer of right calf limited to breakdown 12/10/2018 No Yes of skin I87.321 Chronic venous hypertension (idiopathic) with inflammation of 12/10/2018 No Yes right lower extremity I89.0 Lymphedema, not elsewhere classified 12/10/2018 No Yes Inactive Problems Resolved Problems Electronic Signature(s) Signed: 12/31/2018 4:42:30 PM By: Linton Ham MD Entered By: Linton Ham on 12/31/2018 10:40:39 Radigan, [name] ([id number]) -------------------------------------------------------------------------------- Progress Note Details Patient Name: [name], [name]. Date of Service: 12/31/2018 9:30 AM Medical Record Number: [id number] Patient Account Number: [id number] Date of Birth/Sex: May 04, 1946 (73 y.o. F) Treating RN: Cornell Barman Primary Care Provider: [name] Other Clinician: Referring Provider: [name] Treating Provider/Extender: Tito Dine in Treatment: 3 Subjective History of Present Illness (HPI) Pleasant 73 year old with history of chronic venous insufficiency. No diabetes or peripheral vascular disease. Left ABI 1.29. Questionable history of left lower extremity DVT. She developed a recurrent ulceration on her left lateral calf in December 2015, which she attributes to poor diet and subsequent lower extremity edema. She underwent endovenous laser ablation of her left greater saphenous vein in 2010. She underwent laser ablation of accessory branch of left GSV in April 2016 by Dr. Kellie Simmering at [facility]. She was previously wearing Unna boots, which she tolerated well. Tolerating 2 layer compression and cadexomer iodine. She returns to clinic for follow-up and is without new complaints. She denies any significant pain at this time. She reports persistent pain with pressure. No claudication or ischemic rest pain. No fever or chills. No drainage. READMISSION 11/13/16; this is a  73 year old woman who is not a diabetic. She is here for a review of a painful area on her left medial lower extremity. I note that she was seen here previously last year for wound I believe to be in the same area. At that time she had undergone previously a left greater saphenous vein ablation by Dr. Kellie Simmering and she had a ablation of the anterior accessory branch of the left greater saphenous vein in March 2016. Seeing that the wound actually closed over. In reviewing the history with her today the ulcer in this area has been recurrent. She describes a biopsy of this area in 2009 that only showed stasis physiology. 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 Do not see that she has ever had arterial studies in the left leg. She tells me she has a follow-up with Dr. Kellie Simmering in roughly 10 days In any case she developed the reopening of this area roughly a month ago. On the background of this she describes rapidly increasing edema which has responded to Lasix 40 mg and  metolazone 2.5 mg as well as the patient's lymph massage. She has been told she has both venous insufficiency and lymphedema but she cannot tolerate compression stockings 11/28/16; the patient saw Dr. Kellie Simmering recently. Per the patient he did arterial Dopplers in the office that did not show evidence of arterial insufficiency, per the patient he stated "treat this like an ordinary venous ulcer". She also saw her dermatologist Dr. Ronnald Ramp who felt that this was more of a vascular ulcer. In general things are improving although she arrives today with increasing bilateral lower extremity edema with weeping a deeper fluid through the wound on the left medial leg compatible with some degree of lymphedema 12/04/16; the patient's wound  is fully epithelialized but I don't think fully healed. We will do another week of depression with Promogran and TCA however I suspect we'll be able to discharge her next week. This is a very unusual-looking wound which was initially a figure-of-eight type wound lying on its side surrounded by petechial like hemorrhage. She has had venous ablation on this side. She apparently does not have an arterial issue per Dr. Kellie Simmering. She saw her dermatologist thought it was "vascular". Patient is definitely going to need ongoing compression and I talked about this with her today she will go to elastic therapy after she leaves here next week 12/11/16; the patient's wound is not completely closed today. She has surrounding scar tissue and in further discussion with the patient it would appear that she had ulcers in this area in 2009 for a prolonged period of time ultimately requiring a punch biopsy of this area that only showed venous insufficiency. I did not previously pickup on this part of the history from the patient. 12/18/16; the patient's wound is completely epithelialized. There is no open area here. She has significant bilateral venous insufficiency with secondary lymphedema to a mild-to-moderate degree she does not have compression stockings.. She did not say anything to me when I was in the room, she told our intake nurse that she was still having pain in this area. This isn't unusual recurrent small open area. She is going to go to elastic therapy to obtain compression stockings. 12/25/16; the patient's wound is fully epithelialized. There is no open area here. The patient describes some continued episodic discomfort in this area medial left calf. However everything looks fine and healed here. She is been to elastic therapy and [name], [name] ([id number]) caught herself 15-20 mmHg stockings, they apparently were having trouble getting 20-30 mm stockings in her size 01/22/17; this is a patient we  discharged from the clinic a month ago. She has a recurrent open wound on her medial left calf. She had 15 mm support stockings. I told her I thought she needed 20-30 mm compression stockings. She tells me that she has been ill with hospitalization secondary to asthma and is been found to have severe hypokalemia likely secondary to a combination of Lasix and metolazone. This morning she noted blistering and leaking fluid on the posterior part of her left leg. She called our intake nurse urgently and we was saw her this afternoon. She has not had any real discomfort here. I don't know that she's been wearing any stockings on this leg for at least 2-3 days. ABIs in this clinic were 1.21 on the right and 1.3 on the left. She is previously seen vascular surgery who does not think that there is a peripheral arterial issue. 01/30/17; Patient arrives with no open wound on the left  leg. She has been to elastic therapy and obtained 20-87mmhg below knee stockings and she has one on the right leg today. READMISSION 02/19/18; this Aloi is a now 73 year old patient we've had in this clinic perhaps 3 times before. I had last looked at her from January 07 December 2016 with an area on the medial left leg. We discharged her on 12/25/16 however she had to be readmitted on 01/22/17 with a recurrence. I have in my notes that we discharged her on 20-30 mm stockings although she tells me she was only wearing support hose because she cannot get stockings on predominantly related to her cervical spine surgery/issues. She has had previous ablations done by vein and vascular in [place] including a great saphenous vein ablation on the left with an anterior accessory branch ablation I think both of these were in 2016. On one of the previous visit she had a biopsy noted 2009 that was negative. She is not felt to have an arterial issue. She is not a diabetic. She does have a history of obstructive sleep apnea hypertension  asthma as well as chronic venous insufficiency and lymphedema. On this occasion she noted 2 dry scaly patch on her left leg. She tried to put lotion on this it didn't really help. There were 2 open areas.the patient has been seeing her primary physician from 02/05/18 through 02/14/18. She had Unna boots applied. The superior wound now on the lateral left leg has closed but she's had one wound that remains open on the lateral left leg. This is not the same spot as we dealt with in 2018. ABIs in this clinic were 1.3 bilaterally 02/26/18; patient has a small wound on the left lateral calf. Dimensions are down. She has chronic venous insufficiency and lymphedema. 03/05/18; small open area on the left lateral calf. Dimensions are down. Tightly adherent necrotic debris over the surface of the wound which was difficult to remove. Also the dressing [over collagen] stuck to the wound surface. This was removed with some difficulty as well. Change the primary dressing to Hydrofera Blue ready 03/12/18; small open area on the left lateral calf. Comes in with tightly adherent surface eschar as well as some adherent Hydrofera Blue. 03/19/18; open area on the left lateral calf. Again adherent surface eschar as well as some adherent Hydrofera Blue nonviable subcutaneous tissue. She complained of pain all week even with the reduction from 4-3 layer compression I put on last week. Also she had an increase in her ankle and calf measurements probably related to the same thing. 03/26/18; open area on the left lateral calf. A very small open area remains here. We used silver alginate starting last week as the Hydrofera Blue seem to stick to the wound bed. In using 4-layer compression 04/02/18; the open area in the left lateral calf at some adherent slough which I removed there is no open area here. We are able to transition her into her own compression stocking. Truthfully I think this is probably his support hose. However this  does not maintain skin integrity will be limited. She cannot put over the toe compression stockings on because of neck problems hand problems etc. She is allergic to the lining layer of juxta lites. We might be forced to use extremitease stocking should this fail READMIT 11/24/2018 Patient is now a 73 year old woman who is not a diabetic. She has been in this clinic on at least 3 previous occasions largely with recurrent wounds on her left leg secondary to  chronic venous insufficiency with secondary lymphedema. Her situation is complicated by inability to get stockings on and an allergy to neoprene which is apparently a component and at least juxta lites and other stockings. As a result she really has not been wearing any stockings on her legs. She tells [place] that roughly 2 or 3 weeks ago she started noticing a stinging sensation just above her ankle on the left medial aspect. She has been diagnosed with pseudogout and she wondered whether this was what she was experiencing. She tried to dress this with something she bought at the store however subsequently it pulled skin off and now she has an open wound that is not improving. She has been using Vaseline gauze with a cover bandage. She saw her primary doctor last week who put an Haematologist on her. ABIs in this clinic was 1.03 on the left [name], [name]. ([id number]) 2/12; the area is on the left medial ankle. Odd-looking wound with what looks to be surface epithelialization but a multitude of small petechial openings. This clearly not closed yet. We have been using silver alginate under 3 layer compression with TCA 2/19; the wound area did not look quite as good this week. Necrotic debris over the majority of the wound surface which required debridement. She continues to have a multitude of what looked to be small petechial openings. She reminds [place] that she had a biopsy on this initially during her first outbreak in 2015 in [facility].  She expresses concern about this being a possible melanoma. 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 I assured her that this does not look anything like melanoma. Except for the petechial reaction it does look like a venous insufficiency area and she certainly has evidence of this on both sides 2/26; a difficult area on the left medial ankle. The patient clearly has chronic venous hypertension with some degree of lymphedema. The odd thing about the area is the small petechial hemorrhages. I am not really sure how to explain this. This was present last time and this is not a compression injury. We have been using Hydrofera Blue which I changed to last week Objective Constitutional Sitting or standing Blood Pressure is within target range for patient.. Pulse regular and within target range for patient.Marland Kitchen Respirations regular, non-labored and within target range.. Temperature is normal and within the target range for the patient.Marland Kitchen appears in no distress. Vitals Time Taken: 9:30 AM, Height: 63 in, Weight: 224.7 lbs, BMI: 39.8, Temperature: 98.1 F, Pulse: 83 bpm, Respiratory Rate: 18 breaths/min, Blood Pressure: 146/74 mmHg. Eyes Conjunctivae clear. No discharge. General Notes: Wound exam; the patient's wound had less adherent debris. No debridement was required. The petechial area is still present I do not have a good explanation for this. This extends up well away from the wound area. Some features of what looks to be atrophie blanche. Clearly has venous hypertension with dilated small subdermal venules Integumentary (Hair, Skin) Wound #5 status is Open. Original cause of wound was Gradually Appeared. The wound is located on the Left,Medial Lower Leg. The wound measures 5cm length x 4cm width x 0.1cm depth; 15.708cm^2 area and 1.571cm^3 volume. There is Fat Layer (Subcutaneous Tissue) Exposed exposed.  There is no tunneling or undermining noted. There is a medium amount of serosanguineous drainage noted. The wound margin is indistinct and nonvisible. There is medium (34-66%) red, pink, hyper - granulation within the  wound bed. There is a medium (34-66%) amount of necrotic tissue within the wound bed including Adherent Slough. The periwound skin appearance exhibited: Maceration, Hemosiderin Staining. The periwound skin appearance did not exhibit: Callus, Crepitus, Excoriation, Induration, Rash, Scarring, Dry/Scaly, Atrophie Blanche, Cyanosis, Ecchymosis, Mottled, Pallor, Rubor, Erythema. Periwound temperature was noted as No Abnormality. The periwound has tenderness on palpation. General Notes: Petechiae around wound bed Assessment [name], [name] ([id number]) Active Problems ICD-10 Non-pressure chronic ulcer of right calf limited to breakdown of skin Chronic venous hypertension (idiopathic) with inflammation of right lower extremity Lymphedema, not elsewhere classified Diagnoses ICD-10 L97.211: Non-pressure chronic ulcer of right calf limited to breakdown of skin I87.321: Chronic venous hypertension (idiopathic) with inflammation of right lower extremity I89.0: Lymphedema, not elsewhere classified Procedures Wound #5 Pre-procedure diagnosis of Wound #5 is a Lymphedema located on the Left,Medial Lower Leg . There was a Three Layer Compression Therapy Procedure with a pre-treatment ABI of 1 by Cornell Barman, RN. Post procedure Diagnosis Wound #5: Same as Pre-Procedure Plan Wound Cleansing: Wound #5 Left,Medial Lower Leg: Clean wound with Normal Saline. May Shower, gently pat wound dry prior to applying new dressing. Anesthetic (add to Medication List): Wound #5 Left,Medial Lower Leg: Topical Lidocaine 4% cream applied to wound bed prior to debridement (In Clinic Only). Skin Barriers/Peri-Wound Care: Wound #5 Left,Medial Lower Leg: Triamcinolone Acetonide Ointment (TCA) - on wound and  peri-wound Primary Wound Dressing: Wound #5 Left,Medial Lower Leg: Hydrafera Blue Ready Transfer Secondary Dressing: Wound #5 Left,Medial Lower Leg: ABD pad Dressing Change Frequency: Wound #5 Left,Medial Lower Leg: Change dressing every week Follow-up Appointments: Wound #5 Left,Medial Lower Leg: Return Appointment in 1 week. Nurse Visit as needed Edema Control: Wound #5 Left,Medial Lower Leg: 3 Layer Compression System - Left Lower Extremity [name], [name]. ([id number]) 1. Continue TCA with Hydrofera Blue ABDs under 3 layer compression 2. I am not sure I really know how to explain all of the petechial areas. Also wondered about livedoid vasculopathy. This would require a biopsy and I am not prepared to open the wound in this area yet Electronic Signature(s) Signed: 12/31/2018 4:42:30 PM By: Linton Ham MD Entered By: Linton Ham on 12/31/2018 10:47:40 Delahoussaye, [name] ([id number]) -------------------------------------------------------------------------------- SuperBill Details Patient Name: [name], [name]. Date of Service: 12/31/2018 Medical Record Number: [id number] Patient Account Number: [id number] Date of Birth/Sex: 02-28-1946 (73 y.o. F) Treating RN: Cornell Barman Primary Care Provider: [name] Other Clinician: Referring Provider: [name] Treating Provider/Extender: Tito Dine in Treatment: 3 Diagnosis Coding ICD-10 Codes Code Description [phone] Non-pressure chronic ulcer of right calf limited to breakdown of skin I87.321 Chronic venous hypertension (idiopathic) with inflammation of right lower extremity I89.0 Lymphedema, not elsewhere classified Facility Procedures CPT4 Code: [id number] Description: (Facility Use Only) [phone] - [facility] LWR LT LEG Modifier: Quantity: 1 Physician Procedures CPT4 Code Description: [id number] - WC PHYS LEVEL 2 - EST PT ICD-10 Diagnosis Description L97.211 Non-pressure chronic ulcer  of right calf limited to breakdown I87.321 Chronic venous hypertension (idiopathic) with inflammation of I89.0 Lymphedema, not  elsewhere classified Modifier: of skin right lower extr Quantity: 1 emity Electronic Signature(s) Signed: 12/31/2018 4:42:30 PM By: Linton Ham MD Entered By: Linton Ham on 12/31/2018 10:48:12

## 2019-01-01 NOTE — Patient Instructions (Signed)
Obstructive sleep apnea: > continue CPAP nightly > no driving while sleepy  Allergic rhinitis; > continue Zyrtec > Continue montelukast > Use Flonase 1 puff each nostril daily > Continue Astelin > We will refer you to Dr. Ernst Bowler for further evaluation and consideration of immunotherapy if necessary  Asthma: > Keep using albuterol as needed for chest tightness wheezing or shortness of breath > If you have to take prednisone for a flareup of your asthma please let me know  We will see you back in 6 months or sooner if needed

## 2019-01-02 NOTE — Progress Notes (Signed)
[name], [name] ([id number]) Visit Report for 12/31/2018 Arrival Information Details Patient Name: [name], [name]. Date of Service: 12/31/2018 9:30 AM Medical Record Number: [id number] Patient Account Number: [id number] Date of Birth/Sex: 1946/09/19 (73 y.o. F) Treating RN: Harold Barban Primary Care Elayne Gruver: [name] Other Clinician: Referring Almir Botts: [name] Treating Auren Valdes/Extender: Tito Dine in Treatment: 3 Visit Information History Since Last Visit Added or deleted any medications: No Patient Arrived: Ambulatory Any new allergies or adverse reactions: No Arrival Time: 09:29 Had a fall or experienced change in No Accompanied By: self activities of daily living that may affect Transfer Assistance: None risk of falls: Patient Identification Verified: Yes Signs or symptoms of abuse/neglect since last visito No Secondary Verification Process Completed: Yes Has Dressing in Place as Prescribed: Yes Has Compression in Place as Prescribed: Yes Pain Present Now: Yes Electronic Signature(s) Signed: 01/01/2019 7:54:32 AM By: Harold Barban Entered By: Harold Barban on 12/31/2018 09:29:25 Loudermilk, [name] ([id number]) -------------------------------------------------------------------------------- Compression Therapy Details Patient Name: [name], [name]. Date of Service: 12/31/2018 9:30 AM Medical Record Number: [id number] Patient Account Number: [id number] Date of Birth/Sex: 02-16-1946 (73 y.o. F) Treating RN: Cornell Barman Primary Care Estephany Perot: [name] Other Clinician: Referring Margeart Allender: [name] Treating Kamoni Gentles/Extender: Tito Dine in Treatment: 3 Compression Therapy Performed for Wound Assessment: Wound #5 Left,Medial Lower Leg Performed By: Clinician Cornell Barman, RN Compression Type: Three Layer Pre Treatment ABI: 1 Post Procedure Diagnosis Same as Pre-procedure Electronic Signature(s) Signed: 12/31/2018  5:55:39 PM By: [name], [name], RN, CWS, [name], [name] Entered By: [name], [name], RN, CWS, Kim on 12/31/2018 10:01:13 ADRIANA, QUINBY ([id number]) -------------------------------------------------------------------------------- Encounter Discharge Information Details Patient Name: [name], [name]. Date of Service: 12/31/2018 9:30 AM Medical Record Number: [id number] Patient Account Number: [id number] Date of Birth/Sex: 11-19-1945 (73 y.o. F) Treating RN: Army Melia Primary Care Zierra Laroque: [name] Other Clinician: Referring Alontae Chaloux: [name] Treating Xochilth Standish/Extender: Tito Dine in Treatment: 3 Encounter Discharge Information Items Discharge Condition: Stable Ambulatory Status: Ambulatory Discharge Destination: Home Transportation: Private Auto Accompanied By: self Schedule Follow-up Appointment: Yes Clinical Summary of Care: Electronic Signature(s) Signed: 12/31/2018 10:25:55 AM By: Army Melia Entered By: Army Melia on 12/31/2018 10:25:54 Huitron, [name] ([id number]) -------------------------------------------------------------------------------- Lower Extremity Assessment Details Patient Name: [name], [name]. Date of Service: 12/31/2018 9:30 AM Medical Record Number: [id number] Patient Account Number: [id number] Date of Birth/Sex: [birth year] (73 y.o. F) Treating RN: Harold Barban Primary Care Donnette Macmullen: [name] Other Clinician: Referring Trevia Nop: [name] Treating Lynee Rosenbach/Extender: Tito Dine in Treatment: 3 Edema Assessment Assessed: [Left: No] [Right: No] [Left: Edema] [Right: :] Calf Left: Right: Point of Measurement: 32 cm From Medial Instep 43 cm cm Ankle Left: Right: Point of Measurement: 12 cm From Medial Instep 24 cm cm Vascular Assessment Pulses: Dorsalis Pedis Palpable: [Left:Yes] Posterior Tibial Palpable: [Left:Yes] Extremity colors, hair growth, and conditions: Extremity Color:  [Left:Hyperpigmented] Temperature of Extremity: [Left:Cool] Capillary Refill: [Left:< 3 seconds] Toe Nail Assessment Left: Right: Thick: Yes Improper Length and Hygiene: No Electronic Signature(s) Signed: 01/01/2019 7:54:32 AM By: Harold Barban Entered By: Harold Barban on 12/31/2018 09:41:44 Kleinfelter, [name] ([id number]) -------------------------------------------------------------------------------- Multi Wound Chart Details Patient Name: [name], [name]. Date of Service: 12/31/2018 9:30 AM Medical Record Number: [id number] Patient Account Number: [id number] Date of Birth/Sex: 08-17-1946 (73 y.o. F) Treating RN: Cornell Barman Primary Care Naman Spychalski: [name] Other Clinician: Referring Ylonda Storr: [name] Treating Heavyn Yearsley/Extender: Tito Dine in Treatment: 3 Vital Signs Height(in): 63 Pulse(bpm): 83 Weight(lbs):  224.7 Blood Pressure(mmHg): 146/74 Body Mass Index(BMI): 40 Temperature(F): 98.1 Respiratory Rate 18 (breaths/min): Photos: [5:No Photos] [N/A:N/A] Wound Location: [5:Left Lower Leg - Medial] [N/A:N/A] Wounding Event: [5:Gradually Appeared] [N/A:N/A] Primary Etiology: [5:Lymphedema] [N/A:N/A] Comorbid History: [5:Cataracts, Asthma, Sleep Apnea, Deep Vein Thrombosis, Hypertension, Peripheral Venous Disease, Osteoarthritis, Received Chemotherapy, Received Radiation] [N/A:N/A] Date Acquired: [5:11/19/2018] [N/A:N/A] Weeks of Treatment: [5:3] [N/A:N/A] Wound Status: [5:Open] [N/A:N/A] Measurements L x W x D [5:5x4x0.1] [N/A:N/A] (cm) Area (cm) : [5:15.708] [N/A:N/A] Volume (cm) : [5:1.571] [N/A:N/A] % Reduction in Area: [5:-83.80%] [N/A:N/A] % Reduction in Volume: [5:-83.70%] [N/A:N/A] Classification: [5:Full Thickness Without Exposed Support Structures] [N/A:N/A] Exudate Amount: [5:Medium] [N/A:N/A] Exudate Type: [5:Serosanguineous] [N/A:N/A] Exudate Color: [5:red, brown] [N/A:N/A] Wound Margin: [5:Indistinct, nonvisible]  [N/A:N/A] Granulation Amount: [5:Medium (34-66%)] [N/A:N/A] Granulation Quality: [5:Red, Pink, Hyper-granulation] [N/A:N/A] Necrotic Amount: [5:Medium (34-66%)] [N/A:N/A] Exposed Structures: [5:Fat Layer (Subcutaneous Tissue) Exposed: Yes Fascia: No Tendon: No Muscle: No Joint: No Bone: No] [N/A:N/A] Epithelialization: [5:Small (1-33%)] [N/A:N/A] Periwound Skin Texture: Excoriation: No N/A N/A Induration: No Callus: No Crepitus: No Rash: No Scarring: No Periwound Skin Moisture: Maceration: Yes N/A N/A Dry/Scaly: No Periwound Skin Color: Hemosiderin Staining: Yes N/A N/A Atrophie Blanche: No Cyanosis: No Ecchymosis: No Erythema: No Mottled: No Pallor: No Rubor: No Temperature: No Abnormality N/A N/A Tenderness on Palpation: Yes N/A N/A Wound Preparation: Ulcer Cleansing: N/A N/A Rinsed/Irrigated with Saline, Other: soap and water Topical Anesthetic Applied: None, Other: lidocaine 4% Assessment Notes: Petechiae around wound bed N/A N/A Procedures Performed: Compression Therapy N/A N/A Treatment Notes Wound #5 (Left, Medial Lower Leg) Notes TCA, hydrafera blue, abd, 3-Layer with unna to anchor Electronic Signature(s) Signed: 12/31/2018 4:42:30 PM By: Linton Ham MD Entered By: Linton Ham on 12/31/2018 10:40:48 [name] ([id number]) -------------------------------------------------------------------------------- Multi-Disciplinary Care Plan Details Patient Name: [name], [name]. Date of Service: 12/31/2018 9:30 AM Medical Record Number: [id number] Patient Account Number: [id number] Date of Birth/Sex: [birth year] (73 y.o. F) Treating RN: Cornell Barman Primary Care Esaiah Wanless: [name] Other Clinician: Referring Kamarri Fischetti: [name] Treating Joie Reamer/Extender: Tito Dine in Treatment: 3 Active Inactive Orientation to the Wound Care Program Nursing Diagnoses: Knowledge deficit related to the wound healing center  program Goals: Patient/caregiver will verbalize understanding of the [place] Program Date Initiated: 12/10/2018 Target Resolution Date: 01/09/2019 Goal Status: Active Interventions: Provide education on orientation to the wound center Notes: Soft Tissue Infection Nursing Diagnoses: Impaired tissue integrity Goals: Patient's soft tissue infection will resolve Date Initiated: 12/10/2018 Target Resolution Date: 01/09/2019 Goal Status: Active Interventions: Assess signs and symptoms of infection every visit Notes: Venous Leg Ulcer Nursing Diagnoses: Actual venous Insuffiency (use after diagnosis is confirmed) Goals: Patient will maintain optimal edema control Date Initiated: 12/10/2018 Target Resolution Date: 01/09/2019 Goal Status: Active Interventions: Assess peripheral edema status every visit. [name], [name] ([id number]) Treatment Activities: Therapeutic compression applied : 12/10/2018 Notes: Wound/Skin Impairment Nursing Diagnoses: Impaired tissue integrity Goals: Patient/caregiver will verbalize understanding of skin care regimen Date Initiated: 12/10/2018 Target Resolution Date: 01/09/2019 Goal Status: Active Interventions: Assess ulceration(s) every visit Treatment Activities: Topical wound management initiated : 12/10/2018 Notes: Electronic Signature(s) Signed: 12/31/2018 5:55:39 PM By: [name], [name], RN, CWS, [name], [name] Entered By: [name], [name], RN, CWS, Kim on 12/31/2018 09:58:17 Kovacik, [name] ([id number]) -------------------------------------------------------------------------------- Pain Assessment Details Patient Name: [name], [name]. Date of Service: 12/31/2018 9:30 AM Medical Record Number: [id number] Patient Account Number: [id number] Date of Birth/Sex: [birth year] (73 y.o. F) Treating RN: Harold Barban Primary Care Marquetta Weiskopf: [name] Other Clinician: Referring Jachelle Fluty: [name]  Treating Fahim Kats/Extender: [name] Dillon Weeks in  Treatment: 3 Active Problems Location of Pain Severity and Description of Pain Patient Has Paino Yes Site Locations Pain Management and Medication Current Pain Management: Notes Burning pain Electronic Signature(s) Signed: 01/01/2019 7:54:32 AM By: Harold Barban Entered By: Harold Barban on 12/31/2018 09:30:43 Bachand, [name] ([id number]) -------------------------------------------------------------------------------- Patient/Caregiver Education Details Patient Name: [name], [name]. Date of Service: 12/31/2018 9:30 AM Medical Record Number: [id number] Patient Account Number: [id number] Date of Birth/Gender: 08-04-1946 (73 y.o. F) Treating RN: Cornell Barman Primary Care Physician: [name] Other Clinician: Referring Physician: [name] Treating Physician/Extender: Tito Dine in Treatment: 3 Education Assessment Education Provided To: Patient Education Topics Provided Venous: Handouts: Controlling Swelling with Multilayered Compression Wraps Methods: Demonstration, Explain/Verbal Responses: State content correctly Electronic Signature(s) Signed: 12/31/2018 5:55:39 PM By: [name], [name], RN, CWS, [name], [name] Entered By: [name], [name], RN, CWS, Kim on 12/31/2018 10:03:14 [name] ([id number]) -------------------------------------------------------------------------------- Wound Assessment Details Patient Name: [name], [name]. Date of Service: 12/31/2018 9:30 AM Medical Record Number: [id number] Patient Account Number: [id number] Date of Birth/Sex: 04-26-1946 (73 y.o. F) Treating RN: Harold Barban Primary Care Binnie Droessler: [name] Other Clinician: Referring Yu Peggs: [name] Treating Najma Bozarth/Extender: Tito Dine in Treatment: 3 Wound Status Wound Number: 5 Primary Lymphedema Etiology: Wound Location: Left Lower Leg - Medial Wound Open Wounding Event: Gradually Appeared Status: Date Acquired: 11/19/2018 Comorbid  Cataracts, Asthma, Sleep Apnea, Deep Vein Weeks Of Treatment: 3 History: Thrombosis, Hypertension, Peripheral Venous Clustered Wound: No Disease, Osteoarthritis, Received Chemotherapy, Received Radiation Photos Photo Uploaded By: Harold Barban on 12/31/2018 11:24:23 Wound Measurements Length: (cm) 5 Width: (cm) 4 Depth: (cm) 0.1 Area: (cm) 15.708 Volume: (cm) 1.571 % Reduction in Area: -83.8% % Reduction in Volume: -83.7% Epithelialization: Small (1-33%) Tunneling: No Undermining: No Wound Description Full Thickness Without Exposed Support Classification: Structures Wound Margin: Indistinct, nonvisible Exudate Medium Amount: Exudate Type: Serosanguineous Exudate Color: red, brown Foul Odor After Cleansing: No Slough/Fibrino Yes Wound Bed Granulation Amount: Medium (34-66%) Exposed Structure Granulation Quality: Red, Pink, Hyper-granulation Fascia Exposed: No Necrotic Amount: Medium (34-66%) Fat Layer (Subcutaneous Tissue) Exposed: Yes Necrotic Quality: Adherent Slough Tendon Exposed: No Muscle Exposed: No Joint Exposed: No [name], [name]. ([id number]) Bone Exposed: No Periwound Skin Texture Texture Color No Abnormalities Noted: No No Abnormalities Noted: No Callus: No Atrophie Blanche: No Crepitus: No Cyanosis: No Excoriation: No Ecchymosis: No Induration: No Erythema: No Rash: No Hemosiderin Staining: Yes Scarring: No Mottled: No Pallor: No Moisture Rubor: No No Abnormalities Noted: No Dry / Scaly: No Temperature / Pain Maceration: Yes Temperature: No Abnormality Tenderness on Palpation: Yes Wound Preparation Ulcer Cleansing: Rinsed/Irrigated with Saline, Other: soap and water, Topical Anesthetic Applied: None, Other: lidocaine 4%, Assessment Notes Petechiae around wound bed Treatment Notes Wound #5 (Left, Medial Lower Leg) Notes TCA, hydrafera blue, abd, 3-Layer with unna to anchor Electronic Signature(s) Signed: 12/31/2018 5:55:39  PM By: [name], [name], RN, CWS, [name], [name] Signed: 01/01/2019 7:54:32 AM By: Harold Barban Entered By: [name], [name], RN, CWS, Kim on 12/31/2018 10:00:33 THURMA, PRIEGO ([id number]) -------------------------------------------------------------------------------- Vitals Details Patient Name: LESETTE, [name]. Date of Service: 12/31/2018 9:30 AM Medical Record Number: [id number] Patient Account Number: [id number] Date of Birth/Sex: 04/26/1946 (73 y.o. F) Treating RN: Harold Barban Primary Care Kela Baccari: [name] Other Clinician: Referring Ayeshia Coppin: [name] Treating Ardit Danh/Extender: Tito Dine in Treatment: 3 Vital Signs Time Taken: 09:30 Temperature (F): 98.1 Height (in): 63 Pulse (bpm): 83 Weight (lbs): 224.7 Respiratory Rate (  breaths/min): 18 Body Mass Index (BMI): 39.8 Blood Pressure (mmHg): 146/74 Reference Range: 80 - 120 mg / dl Electronic Signature(s) Signed: 01/01/2019 7:54:32 AM By: Harold Barban Entered By: Harold Barban on 12/31/2018 09:31:49

## 2019-01-05 ENCOUNTER — Ambulatory Visit: Payer: Medicare Other | Admitting: Podiatry

## 2019-01-05 ENCOUNTER — Encounter: Payer: Medicare Other | Attending: Family Medicine

## 2019-01-05 DIAGNOSIS — Z836 Family history of other diseases of the respiratory system: Secondary | ICD-10-CM | POA: Insufficient documentation

## 2019-01-05 DIAGNOSIS — Z85820 Personal history of malignant melanoma of skin: Secondary | ICD-10-CM | POA: Diagnosis not present

## 2019-01-05 DIAGNOSIS — M4802 Spinal stenosis, cervical region: Secondary | ICD-10-CM | POA: Insufficient documentation

## 2019-01-05 DIAGNOSIS — I872 Venous insufficiency (chronic) (peripheral): Secondary | ICD-10-CM | POA: Insufficient documentation

## 2019-01-05 DIAGNOSIS — J45909 Unspecified asthma, uncomplicated: Secondary | ICD-10-CM | POA: Diagnosis not present

## 2019-01-05 DIAGNOSIS — L97221 Non-pressure chronic ulcer of left calf limited to breakdown of skin: Secondary | ICD-10-CM | POA: Diagnosis not present

## 2019-01-05 DIAGNOSIS — Z8249 Family history of ischemic heart disease and other diseases of the circulatory system: Secondary | ICD-10-CM | POA: Diagnosis not present

## 2019-01-05 DIAGNOSIS — G4733 Obstructive sleep apnea (adult) (pediatric): Secondary | ICD-10-CM | POA: Insufficient documentation

## 2019-01-05 DIAGNOSIS — M1990 Unspecified osteoarthritis, unspecified site: Secondary | ICD-10-CM | POA: Insufficient documentation

## 2019-01-05 DIAGNOSIS — Z87891 Personal history of nicotine dependence: Secondary | ICD-10-CM | POA: Diagnosis not present

## 2019-01-05 DIAGNOSIS — I890 Lymphedema, not elsewhere classified: Secondary | ICD-10-CM | POA: Diagnosis not present

## 2019-01-05 DIAGNOSIS — Z86718 Personal history of other venous thrombosis and embolism: Secondary | ICD-10-CM | POA: Diagnosis not present

## 2019-01-05 DIAGNOSIS — Z809 Family history of malignant neoplasm, unspecified: Secondary | ICD-10-CM | POA: Insufficient documentation

## 2019-01-05 DIAGNOSIS — Z9221 Personal history of antineoplastic chemotherapy: Secondary | ICD-10-CM | POA: Insufficient documentation

## 2019-01-05 DIAGNOSIS — E876 Hypokalemia: Secondary | ICD-10-CM | POA: Insufficient documentation

## 2019-01-05 DIAGNOSIS — I10 Essential (primary) hypertension: Secondary | ICD-10-CM | POA: Insufficient documentation

## 2019-01-05 DIAGNOSIS — Z87442 Personal history of urinary calculi: Secondary | ICD-10-CM | POA: Diagnosis not present

## 2019-01-05 DIAGNOSIS — Z841 Family history of disorders of kidney and ureter: Secondary | ICD-10-CM | POA: Insufficient documentation

## 2019-01-06 NOTE — Progress Notes (Signed)
TOWANDA, HORNSTEIN ([id number]) Visit Report for 01/05/2019 Arrival Information Details Patient Name: [name], [name]. Date of Service: 01/05/2019 9:15 AM Medical Record Number: [id number] Patient Account Number: [id number] Date of Birth/Sex: February 01, 1946 (73 y.o. F) Treating RN: Cornell Barman Primary Care Somara Frymire: [name] Other Clinician: Referring Amato Sevillano: [name] Treating Quenna Doepke/Extender: Oneida Arenas in Treatment: 3 Visit Information History Since Last Visit Added or deleted any medications: No Patient Arrived: Ambulatory Any new allergies or adverse reactions: No Arrival Time: 09:29 Had a fall or experienced change in No Accompanied By: self activities of daily living that may affect Transfer Assistance: None risk of falls: Patient Identification Verified: Yes Signs or symptoms of abuse/neglect since last visito No Secondary Verification Process Completed: Yes Hospitalized since last visit: No Implantable device outside of the clinic excluding No cellular tissue based products placed in the center since last visit: Has Dressing in Place as Prescribed: Yes Has Compression in Place as Prescribed: Yes Pain Present Now: Yes Electronic Signature(s) Signed: 01/05/2019 4:00:49 PM By: [name], [name], RN, CWS, [name], [name] Entered By: [name], [name], RN, CWS, Kim on 01/05/2019 09:29:56 [name] ([id number]) -------------------------------------------------------------------------------- Compression Therapy Details Patient Name: [name]. Date of Service: 01/05/2019 9:15 AM Medical Record Number: [id number] Patient Account Number: [id number] Date of Birth/Sex: 29-Aug-1946 (73 y.o. F) Treating RN: Cornell Barman Primary Care Tamlyn Sides: [name] Other Clinician: Referring Reiley Keisler: [name] Treating Bettey Muraoka/Extender: Oneida Arenas in Treatment: 3 Compression Therapy Performed for Wound Assessment: Wound #5 Left,Medial Lower Leg Performed  By: Clinician Cornell Barman, RN Compression Type: Three Layer Pre Treatment ABI: 1 Electronic Signature(s) Signed: 01/05/2019 4:00:49 PM By: [name], [name], RN, CWS, [name], [name] Entered By: [name], [name], RN, CWS, Kim on 01/05/2019 09:32:07 [name] ([id number]) -------------------------------------------------------------------------------- Encounter Discharge Information Details Patient Name: [name]. Date of Service: 01/05/2019 9:15 AM Medical Record Number: [id number] Patient Account Number: [id number] Date of Birth/Sex: [birth year] (73 y.o. F) Treating RN: Cornell Barman Primary Care Veleria Barnhardt: [name] Other Clinician: Referring Halla Chopp: [name] Treating Winner Valeriano/Extender: Oneida Arenas in Treatment: 3 Encounter Discharge Information Items Discharge Condition: Stable Ambulatory Status: Ambulatory Discharge Destination: Home Transportation: Private Auto Accompanied By: self Schedule Follow-up Appointment: Yes Clinical Summary of Care: Electronic Signature(s) Signed: 01/05/2019 4:00:49 PM By: [name], [name], RN, CWS, [name], [name] Entered By: [name], [name], RN, CWS, Kim on 01/05/2019 09:32:49 [name] ([id number]) -------------------------------------------------------------------------------- Pain Assessment Details Patient Name: [name]. Date of Service: 01/05/2019 9:15 AM Medical Record Number: [id number] Patient Account Number: [id number] Date of Birth/Sex: [birth year] (73 y.o. F) Treating RN: Cornell Barman Primary Care Bobie Caris: [name] Other Clinician: Referring Bianka Liberati: [name] Treating Mykeria Garman/Extender: Oneida Arenas in Treatment: 3 Active Problems Location of Pain Severity and Description of Pain Patient Has Paino Yes Site Locations Pain Location: Pain in Ulcers With Dressing Change: Yes Duration of the Pain. Constant / Intermittento Intermittent Rate the pain. Current Pain Level: 3 Worst Pain Level:  5 Character of Pain Describe the Pain: Aching, Burning, Exhausting Pain Management and Medication Current Pain Management: Electronic Signature(s) Signed: 01/05/2019 4:00:49 PM By: [name], [name], RN, CWS, [name], [name] Entered By: [name], [name], RN, CWS, Kim on 01/05/2019 09:30:37 Sahli, [name] ([id number]) -------------------------------------------------------------------------------- Patient/Caregiver Education Details Patient Name: [name]. Date of Service: 01/05/2019 9:15 AM Medical Record Number: [id number] Patient Account Number: [id number] Date of Birth/Gender: [birth year] (73 y.o. F) Treating RN: Cornell Barman Primary Care Physician: [name] Other Clinician: Referring Physician: [name]  Treating Physician/Extender: Oneida Arenas in Treatment: 3 Education Assessment Education Provided To: Patient Education Topics Provided Venous: Handouts: Controlling Swelling with Multilayered Compression Wraps Methods: Demonstration, Explain/Verbal Responses: State content correctly Electronic Signature(s) Signed: 01/05/2019 4:00:49 PM By: [name], [name], RN, CWS, [name], [name] Entered By: [name], [name], RN, CWS, Kim on 01/05/2019 09:32:38 [name] ([id number]) -------------------------------------------------------------------------------- Wound Assessment Details Patient Name: [name]. Date of Service: 01/05/2019 9:15 AM Medical Record Number: [id number] Patient Account Number: [id number] Date of Birth/Sex: Jun 05, 1946 (73 y.o. F) Treating RN: Cornell Barman Primary Care Sharetha Newson: [name] Other Clinician: Referring Delaina Fetsch: [name] Treating Deontray Hunnicutt/Extender: Beather Arbour Weeks in Treatment: 3 Wound Status Wound Number: 5 Primary Lymphedema Etiology: Wound Location: Left Lower Leg - Medial Wound Open Wounding Event: Gradually Appeared Status: Date Acquired: 11/19/2018 Comorbid Cataracts, Asthma, Sleep Apnea, Deep Vein Weeks Of  Treatment: 3 History: Thrombosis, Hypertension, Peripheral Venous Clustered Wound: No Disease, Osteoarthritis, Received Chemotherapy, Received Radiation Wound Measurements Length: (cm) 5 Width: (cm) 4 Depth: (cm) 0.1 Area: (cm) 15.708 Volume: (cm) 1.571 % Reduction in Area: -83.8% % Reduction in Volume: -83.7% Epithelialization: Small (1-33%) Tunneling: No Undermining: No Wound Description Full Thickness Without Exposed Support Foul Odo Classification: Structures Slough/F Wound Margin: Indistinct, nonvisible Exudate Medium Amount: Exudate Type: Serosanguineous Exudate Color: red, brown r After Cleansing: No ibrino Yes Wound Bed Granulation Amount: Medium (34-66%) Exposed Structure Granulation Quality: Red, Pink Fascia Exposed: No Necrotic Amount: Medium (34-66%) Fat Layer (Subcutaneous Tissue) Exposed: Yes Necrotic Quality: Adherent Slough Tendon Exposed: No Muscle Exposed: No Joint Exposed: No Bone Exposed: No Periwound Skin Texture Texture Color No Abnormalities Noted: No No Abnormalities Noted: No Callus: No Atrophie Blanche: No Crepitus: No Cyanosis: No Excoriation: No Ecchymosis: No Induration: No Erythema: No Rash: No Hemosiderin Staining: Yes Scarring: No Mottled: No Pallor: No Moisture Rubor: No No Abnormalities Noted: No AUSTINA, CONSTANTIN ([id number]) Dry / Scaly: No Temperature / Pain Maceration: Yes Temperature: No Abnormality Tenderness on Palpation: Yes Wound Preparation Ulcer Cleansing: Rinsed/Irrigated with Saline, Other: soap and water, Topical Anesthetic Applied: None, Other: lidocaine 4%, Assessment Notes Petechiae in and around wounded area Treatment Notes Wound #5 (Left, Medial Lower Leg) Notes TCA, hydrafera blue, abd, 3-Layer with unna to anchor Electronic Signature(s) Signed: 01/05/2019 4:00:49 PM By: [name], [name], RN, CWS, [name], [name] Entered By: [name], [name], RN, CWS, Kim on 01/05/2019 09:31:45

## 2019-01-07 ENCOUNTER — Encounter: Payer: Medicare Other | Admitting: Internal Medicine

## 2019-01-07 DIAGNOSIS — L97221 Non-pressure chronic ulcer of left calf limited to breakdown of skin: Secondary | ICD-10-CM | POA: Diagnosis not present

## 2019-01-07 DIAGNOSIS — I10 Essential (primary) hypertension: Secondary | ICD-10-CM | POA: Diagnosis not present

## 2019-01-07 DIAGNOSIS — I872 Venous insufficiency (chronic) (peripheral): Secondary | ICD-10-CM | POA: Diagnosis not present

## 2019-01-07 DIAGNOSIS — L97222 Non-pressure chronic ulcer of left calf with fat layer exposed: Secondary | ICD-10-CM | POA: Diagnosis not present

## 2019-01-07 DIAGNOSIS — Z86718 Personal history of other venous thrombosis and embolism: Secondary | ICD-10-CM | POA: Diagnosis not present

## 2019-01-07 DIAGNOSIS — J45909 Unspecified asthma, uncomplicated: Secondary | ICD-10-CM | POA: Diagnosis not present

## 2019-01-07 DIAGNOSIS — I890 Lymphedema, not elsewhere classified: Secondary | ICD-10-CM | POA: Diagnosis not present

## 2019-01-08 DIAGNOSIS — Z86718 Personal history of other venous thrombosis and embolism: Secondary | ICD-10-CM | POA: Diagnosis not present

## 2019-01-08 DIAGNOSIS — I10 Essential (primary) hypertension: Secondary | ICD-10-CM | POA: Diagnosis not present

## 2019-01-08 DIAGNOSIS — I890 Lymphedema, not elsewhere classified: Secondary | ICD-10-CM | POA: Diagnosis not present

## 2019-01-08 DIAGNOSIS — J45909 Unspecified asthma, uncomplicated: Secondary | ICD-10-CM | POA: Diagnosis not present

## 2019-01-08 DIAGNOSIS — L97221 Non-pressure chronic ulcer of left calf limited to breakdown of skin: Secondary | ICD-10-CM | POA: Diagnosis not present

## 2019-01-08 DIAGNOSIS — I872 Venous insufficiency (chronic) (peripheral): Secondary | ICD-10-CM | POA: Diagnosis not present

## 2019-01-09 NOTE — Progress Notes (Signed)
EURA, MCCAUSLIN ([id number]) Visit Report for 01/07/2019 Debridement Details Patient Name: [name], [name]. Date of Service: 01/07/2019 9:45 AM Medical Record Number: [id number] Patient Account Number: [id number] Date of Birth/Sex: 02/11/1946 (73 y.o. F) Treating RN: Cornell Barman Primary Care Provider: [name] Other Clinician: Referring Provider: [name] Treating Provider/Extender: Tito Dine in Treatment: 4 Debridement Performed for Wound #5 Left,Medial Lower Leg Assessment: Performed By: Physician [name] Dillon, MD Debridement Type: Debridement Level of Consciousness (Pre- Awake and Alert procedure): Pre-procedure Verification/Time Yes - 10:03 Out Taken: Start Time: 10:03 Pain Control: Lidocaine Total Area Debrided (L x W): 3 (cm) x 4 (cm) = 12 (cm) Tissue and other material Viable, Non-Viable, Slough, Subcutaneous, Slough debrided: Level: Skin/Subcutaneous Tissue Debridement Description: Excisional Instrument: Curette Bleeding: Moderate Hemostasis Achieved: Pressure End Time: 10:05 Response to Treatment: Procedure was tolerated well Level of Consciousness Awake and Alert (Post-procedure): Post Debridement Measurements of Total Wound Length: (cm) 3 Width: (cm) 4 Depth: (cm) 0.2 Volume: (cm) 1.885 Character of Wound/Ulcer Post Debridement: Stable Post Procedure Diagnosis Same as Pre-procedure Electronic Signature(s) Signed: 01/07/2019 5:05:37 PM By: Linton Ham MD Signed: 01/07/2019 5:29:19 PM By: [name], [name], RN, CWS, [name], [name] Entered By: Linton Ham on 01/07/2019 10:14:47 Marcotte, [name] ([id number]) -------------------------------------------------------------------------------- HPI Details Patient Name: Amber, [name]. Date of Service: 01/07/2019 9:45 AM Medical Record Number: [id number] Patient Account Number: [id number] Date of Birth/Sex: 1945-12-17 (73 y.o. F) Treating RN: Cornell Barman Primary Care Provider: [name] Other Clinician: Referring Provider: [name] Treating Provider/Extender: Tito Dine in Treatment: 4 History of Present Illness HPI Description: Pleasant 73 year old with history of chronic venous insufficiency. No diabetes or peripheral vascular disease. Left ABI 1.29. Questionable history of left lower extremity DVT. She developed a recurrent ulceration on her left lateral calf in December 2015, which she attributes to poor diet and subsequent lower extremity edema. She underwent endovenous laser ablation of her left greater saphenous vein in 2010. She underwent laser ablation of accessory branch of left GSV in April 2016 by Dr. Kellie Simmering at [facility]. She was previously wearing Unna boots, which she tolerated well. Tolerating 2 layer compression and cadexomer iodine. She returns to clinic for follow-up and is without new complaints. She denies any significant pain at this time. She reports persistent pain with pressure. No claudication or ischemic rest pain. No fever or chills. No drainage. READMISSION 11/13/16; this is a 73 year old woman who is not a diabetic. She is here for a review of a painful area on her left medial lower extremity. I note that she was seen here previously last year for wound I believe to be in the same area. At that time she had undergone previously a left greater saphenous vein ablation by Dr. Kellie Simmering and she had a ablation of the anterior accessory branch of the left greater saphenous vein in March 2016. Seeing that the wound actually closed over. In reviewing the history with her today the ulcer in this area has been recurrent. She describes a biopsy of this area in 2009 that only showed stasis physiology. 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 Do not see that she has ever had arterial studies in the left leg. She tells me she has a follow-up with  Dr. Kellie Simmering in roughly 10 days In any case she developed the reopening of this area roughly a month ago. On the background of this she describes rapidly increasing edema which has responded to Lasix 40 mg and metolazone 2.5 mg as well as the patient's lymph massage. She has been told she has both venous insufficiency and lymphedema but she cannot tolerate compression stockings 11/28/16; the patient saw Dr. Kellie Simmering recently. Per the patient he did arterial Dopplers in the office that did not show evidence of arterial insufficiency, per the patient he stated "treat this like an ordinary venous ulcer". She also saw her dermatologist Dr. Ronnald Ramp who felt that this was more of a vascular ulcer. In general things are improving although she arrives today with increasing bilateral lower extremity edema with weeping a deeper fluid through the wound on the left medial leg compatible with some degree of lymphedema 12/04/16; the patient's wound is fully epithelialized but I don't think fully healed. We will do another week of depression with Promogran and TCA however I suspect we'll be able to discharge her next week. This is a very unusual-looking wound which was initially a figure-of-eight type wound lying on its side surrounded by petechial like hemorrhage. She has had venous ablation on this side. She apparently does not have an arterial issue per Dr. Kellie Simmering. She saw her dermatologist thought it was "vascular". Patient is definitely going to need ongoing compression and I talked about this with her today she will go to elastic therapy after she leaves here next week 12/11/16; the patient's wound is not completely closed today. She has surrounding scar tissue and in further discussion with the patient it would appear that she had ulcers in this area in 2009 for a prolonged  period of time ultimately requiring a punch biopsy of this area that only showed venous insufficiency. I did not previously pickup on this part of the history from the patient. 12/18/16; the patient's wound is completely epithelialized. There is no open area here. She has significant bilateral venous insufficiency with secondary lymphedema to a mild-to-moderate degree she does not have compression stockings.. She did not say anything to me when I was in the room, she told our intake nurse that she was still having pain in this area. This isn't unusual recurrent small open area. She is going to go to elastic therapy to obtain compression stockings. 12/25/16; the patient's wound is fully epithelialized. There is no open area here. The patient describes some continued episodic discomfort in this area medial left calf. However everything looks fine and healed here. She is been to elastic therapy and caught herself 15-20 mmHg stockings, they apparently were having trouble getting 20-30 mm stockings in her size RILYN, SCROGGS ([id number]) 01/22/17; this is a patient we discharged from the clinic a month ago. She has a recurrent open wound on her medial left calf. She had 15 mm support stockings. I told her I thought she needed 20-30 mm compression stockings. She tells me that she has been ill with hospitalization secondary to asthma and is been found to have severe hypokalemia likely secondary to a combination of Lasix and metolazone. This morning she noted blistering and leaking fluid on the posterior part of her left leg. She called our intake nurse urgently and we was saw her this afternoon. She has not had any real discomfort here. I don't know that she's been wearing any stockings on this leg for at least 2-3 days. ABIs in this clinic  were 1.21 on the right and 1.3 on the left. She is previously seen vascular surgery who does not think that there is a peripheral arterial issue. 01/30/17; Patient  arrives with no open wound on the left leg. She has been to elastic therapy and obtained 20-21mmhg below knee stockings and she has one on the right leg today. READMISSION 02/19/18; this Schlicker is a now 73 year old patient we've had in this clinic perhaps 3 times before. I had last looked at her from January 07 December 2016 with an area on the medial left leg. We discharged her on 12/25/16 however she had to be readmitted on 01/22/17 with a recurrence. I have in my notes that we discharged her on 20-30 mm stockings although she tells me she was only wearing support hose because she cannot get stockings on predominantly related to her cervical spine surgery/issues. She has had previous ablations done by vein and vascular in [place] including a great saphenous vein ablation on the left with an anterior accessory branch ablation I think both of these were in 2016. On one of the previous visit she had a biopsy noted 2009 that was negative. She is not felt to have an arterial issue. She is not a diabetic. She does have a history of obstructive sleep apnea hypertension asthma as well as chronic venous insufficiency and lymphedema. On this occasion she noted 2 dry scaly patch on her left leg. She tried to put lotion on this it didn't really help. There were 2 open areas.the patient has been seeing her primary physician from 02/05/18 through 02/14/18. She had Unna boots applied. The superior wound now on the lateral left leg has closed but she's had one wound that remains open on the lateral left leg. This is not the same spot as we dealt with in 2018. ABIs in this clinic were 1.3 bilaterally 02/26/18; patient has a small wound on the left lateral calf. Dimensions are down. She has chronic venous insufficiency and lymphedema. 03/05/18; small open area on the left lateral calf. Dimensions are down. Tightly adherent necrotic debris over the surface of the wound which was difficult to remove. Also the dressing  [over collagen] stuck to the wound surface. This was removed with some difficulty as well. Change the primary dressing to Hydrofera Blue ready 03/12/18; small open area on the left lateral calf. Comes in with tightly adherent surface eschar as well as some adherent Hydrofera Blue. 03/19/18; open area on the left lateral calf. Again adherent surface eschar as well as some adherent Hydrofera Blue nonviable subcutaneous tissue. She complained of pain all week even with the reduction from 4-3 layer compression I put on last week. Also she had an increase in her ankle and calf measurements probably related to the same thing. 03/26/18; open area on the left lateral calf. A very small open area remains here. We used silver alginate starting last week as the Hydrofera Blue seem to stick to the wound bed. In using 4-layer compression 04/02/18; the open area in the left lateral calf at some adherent slough which I removed there is no open area here. We are able to transition her into her own compression stocking. Truthfully I think this is probably his support hose. However this does not maintain skin integrity will be limited. She cannot put over the toe compression stockings on because of neck problems hand problems etc. She is allergic to the lining layer of juxta lites. We might be forced to use extremitease stocking should  this fail READMIT 11/24/2018 Patient is now a 73 year old woman who is not a diabetic. She has been in this clinic on at least 3 previous occasions largely with recurrent wounds on her left leg secondary to chronic venous insufficiency with secondary lymphedema. Her situation is complicated by inability to get stockings on and an allergy to neoprene which is apparently a component and at least juxta lites and other stockings. As a result she really has not been wearing any stockings on her legs. She tells [place] that roughly 2 or 3 weeks ago she started noticing a stinging sensation just  above her ankle on the left medial aspect. She has been diagnosed with pseudogout and she wondered whether this was what she was experiencing. She tried to dress this with something she bought at the store however subsequently it pulled skin off and now she has an open wound that is not improving. She has been using Vaseline gauze with a cover bandage. She saw her primary doctor last week who put an Haematologist on her. ABIs in this clinic was 1.03 on the left LARCENIA, HOLADAY. ([id number]) 2/12; the area is on the left medial ankle. Odd-looking wound with what looks to be surface epithelialization but a multitude of small petechial openings. This clearly not closed yet. We have been using silver alginate under 3 layer compression with TCA 2/19; the wound area did not look quite as good this week. Necrotic debris over the majority of the wound surface which required debridement. She continues to have a multitude of what looked to be small petechial openings. She reminds [place] that she had a biopsy on this initially during her first outbreak in 2015 in [place] [facility]. She expresses concern about this being a possible melanoma. 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 I assured her that this does not look anything like melanoma. Except for the petechial reaction it does look like a venous insufficiency area and she certainly has evidence of this on both sides 2/26; a difficult area on the left medial ankle. The patient clearly has chronic venous hypertension with some degree of lymphedema. The odd thing about the area is the small petechial hemorrhages. I am not really sure how to explain this. This was present last time and this is not a compression injury. We have been using Hydrofera Blue which I changed to last week 3/4; still using Hydrofera Blue. Aggressive debridement today. She does not have known  arterial issues. She has seen Dr. Kellie Simmering at [facility] vascular and and has an ablation on the left. [Anterior accessory branch of the greater saphenous]. From what I remember they did not feel she had an arterial issue. The patient has had this area biopsied in 2009 at [facility] dermatology and by her recollection they said this was "stasis". She is also follow-up with dermatology locally who thought that this was more of a vascular issue Electronic Signature(s) Signed: 01/07/2019 5:05:37 PM By: Linton Ham MD Entered By: Linton Ham on 01/07/2019 10:19:42 Gubler, [name] ([id number]) -------------------------------------------------------------------------------- Physical Exam Details Patient Name: ABBEY, VEITH. Date of Service: 01/07/2019 9:45 AM Medical Record Number: [id number] Patient Account Number: [id number] Date of Birth/Sex: [birth year] (73 y.o. F) Treating RN: Cornell Barman Primary Care Provider: [name] Other Clinician: Referring Provider: [name] Treating Provider/Extender: Tito Dine in Treatment: 4 Constitutional Sitting or standing Blood Pressure is within target range for  patient.. Pulse regular and within target range for patient.Marland Kitchen Respirations regular, non-labored and within target range.. Temperature is normal and within the target range for the patient.Marland Kitchen appears in no distress. Cardiovascular pulses palpable on left. Notes Wound exam; patient's wound today had very adherent debris. A difficult debridement with a #5 curette and even then I am not sure I am able to get down to a viable surface here. She very clearly has venous hypertension and some degree of lymphedema however I am not really sure this explains all of this Electronic Signature(s) Signed: 01/07/2019 5:05:37 PM By: Linton Ham MD Entered By: Linton Ham on 01/07/2019 10:31:01 [name]  ([id number]) -------------------------------------------------------------------------------- Physician Orders Details Patient Name: RAEVIN, WIERENGA. Date of Service: 01/07/2019 9:45 AM Medical Record Number: [id number] Patient Account Number: [id number] Date of Birth/Sex: April 28, 1946 (73 y.o. F) Treating RN: Cornell Barman Primary Care Provider: [name] Other Clinician: Referring Provider: [name] Treating Provider/Extender: Tito Dine in Treatment: 4 Verbal / Phone Orders: No Diagnosis Coding Wound Cleansing Wound #5 Left,Medial Lower Leg o Clean wound with Normal Saline. o May Shower, gently pat wound dry prior to applying new dressing. Anesthetic (add to Medication List) Wound #5 Left,Medial Lower Leg o Topical Lidocaine 4% cream applied to wound bed prior to debridement (In Clinic Only). o Benzocaine Topical Anesthetic Spray applied to wound bed prior to debridement (In Clinic Only). Skin Barriers/Peri-Wound Care Wound #5 Left,Medial Lower Leg o Triamcinolone Acetonide Ointment (TCA) - on wound and peri-wound Primary Wound Dressing Wound #5 Left,Medial Lower Leg o Hydrafera Blue Ready Transfer Secondary Dressing Wound #5 Left,Medial Lower Leg o ABD pad Dressing Change Frequency Wound #5 Left,Medial Lower Leg o Change dressing every week Follow-up Appointments Wound #5 Left,Medial Lower Leg o Return Appointment in 1 week. o Nurse Visit as needed Edema Control Wound #5 Left,Medial Lower Leg o 3 Layer Compression System - Left Lower Extremity Electronic Signature(s) Signed: 01/07/2019 5:05:37 PM By: Linton Ham MD Signed: 01/07/2019 5:29:19 PM By: [name], [name], RN, CWS, [name], [name] Entered By: [name], [name], RN, CWS, Kim on 01/07/2019 10:05:56 MELVIN, MARMO ([id number], CRADY ([id number]) -------------------------------------------------------------------------------- Problem List Details Patient Name: AYLYN, WENZLER. Date of Service: 01/07/2019 9:45 AM Medical Record Number: [id number] Patient Account Number: [id number] Date of Birth/Sex: [birth year] (73 y.o. F) Treating RN: Cornell Barman Primary Care Provider: [name] Other Clinician: Referring Provider: [name] Treating Provider/Extender: Tito Dine in Treatment: 4 Active Problems ICD-10 Evaluated Encounter Code Description Active Date Today Diagnosis L97.221 Non-pressure chronic ulcer of left calf limited to breakdown of 01/07/2019 No Yes skin I87.321 Chronic venous hypertension (idiopathic) with inflammation of 12/10/2018 No Yes right lower extremity I89.0 Lymphedema, not elsewhere classified 12/10/2018 No Yes Inactive Problems Resolved Problems Electronic Signature(s) Signed: 01/07/2019 5:05:37 PM By: Linton Ham MD Entered By: Linton Ham on 01/07/2019 10:14:09 [name] ([id number]) -------------------------------------------------------------------------------- Progress Note Details Patient Name: QUINTERIA, CHISUM. Date of Service: 01/07/2019 9:45 AM Medical Record Number: [id number] Patient Account Number: [id number] Date of Birth/Sex: Dec 23, 1945 (73 y.o. F) Treating RN: Cornell Barman Primary Care Provider: [name] Other Clinician: Referring Provider: [name] Treating Provider/Extender: Tito Dine in Treatment: 4 Subjective History of Present Illness (HPI) Pleasant 73 year old with history of chronic venous insufficiency. No diabetes or peripheral vascular disease. Left ABI 1.29. Questionable history of left lower extremity DVT. She developed a recurrent ulceration on her left lateral calf in December 2015, which she attributes to poor diet  and subsequent lower extremity edema. She underwent endovenous laser ablation of her left greater saphenous vein in 2010. She underwent laser ablation of accessory branch of left GSV in April 2016 by Dr. Kellie Simmering at [facility]. She  was previously wearing Unna boots, which she tolerated well. Tolerating 2 layer compression and cadexomer iodine. She returns to clinic for follow-up and is without new complaints. She denies any significant pain at this time. She reports persistent pain with pressure. No claudication or ischemic rest pain. No fever or chills. No drainage. READMISSION 11/13/16; this is a 73 year old woman who is not a diabetic. She is here for a review of a painful area on her left medial lower extremity. I note that she was seen here previously last year for wound I believe to be in the same area. At that time she had undergone previously a left greater saphenous vein ablation by Dr. Kellie Simmering and she had a ablation of the anterior accessory branch of the left greater saphenous vein in March 2016. Seeing that the wound actually closed over. In reviewing the history with her today the ulcer in this area has been recurrent. She describes a biopsy of this area in 2009 that only showed stasis physiology. 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 Do not see that she has ever had arterial studies in the left leg. She tells me she has a follow-up with Dr. Kellie Simmering in roughly 10 days In any case she developed the reopening of this area roughly a month ago. On the background of this she describes rapidly increasing edema which has responded to Lasix 40 mg and metolazone 2.5 mg as well as the patient's lymph massage. She has been told she has both venous insufficiency and lymphedema but she cannot tolerate compression stockings 11/28/16; the patient saw Dr. Kellie Simmering recently. Per the patient he did arterial Dopplers in the office that did not show evidence of arterial insufficiency, per the patient he stated "treat  this like an ordinary venous ulcer". She also saw her dermatologist Dr. Ronnald Ramp who felt that this was more of a vascular ulcer. In general things are improving although she arrives today with increasing bilateral lower extremity edema with weeping a deeper fluid through the wound on the left medial leg compatible with some degree of lymphedema 12/04/16; the patient's wound is fully epithelialized but I don't think fully healed. We will do another week of depression with Promogran and TCA however I suspect we'll be able to discharge her next week. This is a very unusual-looking wound which was initially a figure-of-eight type wound lying on its side surrounded by petechial like hemorrhage. She has had venous ablation on this side. She apparently does not have an arterial issue per Dr. Kellie Simmering. She saw her dermatologist thought it was "vascular". Patient is definitely going to need ongoing compression and I talked about this with her today she will go to elastic therapy after she leaves here next week 12/11/16; the patient's wound is not completely closed today. She has surrounding scar tissue and in further discussion with the patient it would appear that she had ulcers in this area in 2009 for a prolonged period of time ultimately requiring a punch biopsy of this area that only showed venous insufficiency. I did  not previously pickup on this part of the history from the patient. 12/18/16; the patient's wound is completely epithelialized. There is no open area here. She has significant bilateral venous insufficiency with secondary lymphedema to a mild-to-moderate degree she does not have compression stockings.. She did not say anything to me when I was in the room, she told our intake nurse that she was still having pain in this area. This isn't unusual recurrent small open area. She is going to go to elastic therapy to obtain compression stockings. 12/25/16; the patient's wound is fully epithelialized.  There is no open area here. The patient describes some continued episodic discomfort in this area medial left calf. However everything looks fine and healed here. She is been to elastic therapy and MARGURETE, GUAMAN ([id number]) caught herself 15-20 mmHg stockings, they apparently were having trouble getting 20-30 mm stockings in her size 01/22/17; this is a patient we discharged from the clinic a month ago. She has a recurrent open wound on her medial left calf. She had 15 mm support stockings. I told her I thought she needed 20-30 mm compression stockings. She tells me that she has been ill with hospitalization secondary to asthma and is been found to have severe hypokalemia likely secondary to a combination of Lasix and metolazone. This morning she noted blistering and leaking fluid on the posterior part of her left leg. She called our intake nurse urgently and we was saw her this afternoon. She has not had any real discomfort here. I don't know that she's been wearing any stockings on this leg for at least 2-3 days. ABIs in this clinic were 1.21 on the right and 1.3 on the left. She is previously seen vascular surgery who does not think that there is a peripheral arterial issue. 01/30/17; Patient arrives with no open wound on the left leg. She has been to elastic therapy and obtained 20-82mmhg below knee stockings and she has one on the right leg today. READMISSION 02/19/18; this Jaster is a now 72 year old patient we've had in this clinic perhaps 3 times before. I had last looked at her from January 07 December 2016 with an area on the medial left leg. We discharged her on 12/25/16 however she had to be readmitted on 01/22/17 with a recurrence. I have in my notes that we discharged her on 20-30 mm stockings although she tells me she was only wearing support hose because she cannot get stockings on predominantly related to her cervical spine surgery/issues. She has had previous ablations done by vein  and vascular in [place] including a great saphenous vein ablation on the left with an anterior accessory branch ablation I think both of these were in 2016. On one of the previous visit she had a biopsy noted 2009 that was negative. She is not felt to have an arterial issue. She is not a diabetic. She does have a history of obstructive sleep apnea hypertension asthma as well as chronic venous insufficiency and lymphedema. On this occasion she noted 2 dry scaly patch on her left leg. She tried to put lotion on this it didn't really help. There were 2 open areas.the patient has been seeing her primary physician from 02/05/18 through 02/14/18. She had Unna boots applied. The superior wound now on the lateral left leg has closed but she's had one wound that remains open on the lateral left leg. This is not the same spot as we dealt with in 2018. ABIs in this clinic were 1.3  bilaterally 02/26/18; patient has a small wound on the left lateral calf. Dimensions are down. She has chronic venous insufficiency and lymphedema. 03/05/18; small open area on the left lateral calf. Dimensions are down. Tightly adherent necrotic debris over the surface of the wound which was difficult to remove. Also the dressing [over collagen] stuck to the wound surface. This was removed with some difficulty as well. Change the primary dressing to Hydrofera Blue ready 03/12/18; small open area on the left lateral calf. Comes in with tightly adherent surface eschar as well as some adherent Hydrofera Blue. 03/19/18; open area on the left lateral calf. Again adherent surface eschar as well as some adherent Hydrofera Blue nonviable subcutaneous tissue. She complained of pain all week even with the reduction from 4-3 layer compression I put on last week. Also she had an increase in her ankle and calf measurements probably related to the same thing. 03/26/18; open area on the left lateral calf. A very small open area remains here. We used  silver alginate starting last week as the Hydrofera Blue seem to stick to the wound bed. In using 4-layer compression 04/02/18; the open area in the left lateral calf at some adherent slough which I removed there is no open area here. We are able to transition her into her own compression stocking. Truthfully I think this is probably his support hose. However this does not maintain skin integrity will be limited. She cannot put over the toe compression stockings on because of neck problems hand problems etc. She is allergic to the lining layer of juxta lites. We might be forced to use extremitease stocking should this fail READMIT 11/24/2018 Patient is now a 73 year old woman who is not a diabetic. She has been in this clinic on at least 3 previous occasions largely with recurrent wounds on her left leg secondary to chronic venous insufficiency with secondary lymphedema. Her situation is complicated by inability to get stockings on and an allergy to neoprene which is apparently a component and at least juxta lites and other stockings. As a result she really has not been wearing any stockings on her legs. She tells [place] that roughly 2 or 3 weeks ago she started noticing a stinging sensation just above her ankle on the left medial aspect. She has been diagnosed with pseudogout and she wondered whether this was what she was experiencing. She tried to dress this with something she bought at the store however subsequently it pulled skin off and now she has an open wound that is not improving. She has been using Vaseline gauze with a cover bandage. She saw her primary doctor last week who put an Haematologist on her. ABIs in this clinic was 1.03 on the left BRAYDEE, SHIMKUS. ([id number]) 2/12; the area is on the left medial ankle. Odd-looking wound with what looks to be surface epithelialization but a multitude of small petechial openings. This clearly not closed yet. We have been using silver alginate under 3  layer compression with TCA 2/19; the wound area did not look quite as good this week. Necrotic debris over the majority of the wound surface which required debridement. She continues to have a multitude of what looked to be small petechial openings. She reminds [place] that she had a biopsy on this initially during her first outbreak in 2015 in [place] dermatology. She expresses concern about this being a possible melanoma. 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 I assured her that this does not look anything like melanoma. Except for the petechial reaction it does look like a venous insufficiency area and she certainly has evidence of this on both sides 2/26; a difficult area on the left medial ankle. The patient clearly has chronic venous hypertension with some degree of lymphedema. The odd thing about the area is the small petechial hemorrhages. I am not really sure how to explain this. This was present last time and this is not a compression injury. We have been using Hydrofera Blue which I changed to last week 3/4; still using Hydrofera Blue. Aggressive debridement today. She does not have known arterial issues. She has seen Dr. Kellie Simmering at [facility] and and has an ablation on the left. [Anterior accessory branch of the greater saphenous]. From what I remember they did not feel she had an arterial issue. The patient has had this area biopsied in 2009 at [facility] and by her recollection they said this was "stasis". She is also follow-up with dermatology locally who thought that this was more of a vascular issue Objective Constitutional Sitting or standing Blood Pressure is within target range for patient.. Pulse regular and within target range for patient.Marland Kitchen Respirations regular, non-labored and within target range.. Temperature is normal and within the target range for the  patient.Marland Kitchen appears in no distress. Vitals Time Taken: 9:44 AM, Height: 63 in, Weight: 224.7 lbs, BMI: 39.8, Temperature: 97.5 F, Pulse: 59 bpm, Respiratory Rate: 18 breaths/min, Blood Pressure: 139/81 mmHg. Cardiovascular pulses palpable on left. General Notes: Wound exam; patient's wound today had very adherent debris. A difficult debridement with a #5 curette and even then I am not sure I am able to get down to a viable surface here. She very clearly has venous hypertension and some degree of lymphedema however I am not really sure this explains all of this Integumentary (Hair, Skin) Wound #5 status is Open. Original cause of wound was Gradually Appeared. The wound is located on the Left,Medial Lower Leg. The wound measures 3cm length x 4cm width x 0.1cm depth; 9.425cm^2 area and 0.942cm^3 volume. There is Fat Layer (Subcutaneous Tissue) Exposed exposed. There is no tunneling or undermining noted. There is a medium amount of serosanguineous drainage noted. The wound margin is indistinct and nonvisible. There is small (1-33%) red, pink granulation within the wound bed. There is a large (67-100%) amount of necrotic tissue within the wound bed including Adherent Slough. The periwound skin appearance exhibited: Maceration, Hemosiderin Staining. The periwound skin appearance did not exhibit: Callus, Crepitus, Excoriation, Induration, Rash, Scarring, Dry/Scaly, Atrophie Blanche, Cyanosis, Ecchymosis, Mottled, Pallor, Rubor, Erythema. Periwound temperature was noted as No Abnormality. The periwound has tenderness on palpation. [name], [name] ([id number]) Assessment Active Problems ICD-10 Non-pressure chronic ulcer of left calf limited to breakdown of skin Chronic venous hypertension (idiopathic) with inflammation of right lower extremity Lymphedema, not elsewhere classified Procedures Wound #5 Pre-procedure diagnosis of Wound #5 is a Lymphedema located on the Left,Medial Lower Leg . There was  a Excisional Skin/Subcutaneous Tissue Debridement with a total area of 12 sq cm performed by [name] Dillon, MD. With the following instrument(s): Curette to remove Viable and Non-Viable tissue/material. Material removed includes Subcutaneous Tissue and Slough and after achieving pain control using Lidocaine. No specimens were taken. A time out was conducted at 10:03, prior to the start of the procedure. A Moderate amount of bleeding was controlled with Pressure. The procedure was tolerated well. Post  Debridement Measurements: 3cm length x 4cm width x 0.2cm depth; 1.885cm^3 volume. Character of Wound/Ulcer Post Debridement is stable. Post procedure Diagnosis Wound #5: Same as Pre-Procedure Plan Wound Cleansing: Wound #5 Left,Medial Lower Leg: Clean wound with Normal Saline. May Shower, gently pat wound dry prior to applying new dressing. Anesthetic (add to Medication List): Wound #5 Left,Medial Lower Leg: Topical Lidocaine 4% cream applied to wound bed prior to debridement (In Clinic Only). Benzocaine Topical Anesthetic Spray applied to wound bed prior to debridement (In Clinic Only). Skin Barriers/Peri-Wound Care: Wound #5 Left,Medial Lower Leg: Triamcinolone Acetonide Ointment (TCA) - on wound and peri-wound Primary Wound Dressing: Wound #5 Left,Medial Lower Leg: Hydrafera Blue Ready Transfer Secondary Dressing: Wound #5 Left,Medial Lower Leg: ABD pad Dressing Change Frequency: Wound #5 Left,Medial Lower Leg: Change dressing every week Follow-up Appointments: Wound #5 Left,Medial Lower Leg: Return Appointment in 1 week. Nurse Visit as needed Edema Control: THAO, BAUZA ([id number]) Wound #5 Left,Medial Lower Leg: 3 Layer Compression System - Left Lower Extremity 1. I am continuing with the [facility] for this week 2. Consider Iodoflex 3. The patient has had ablations on the left side by Dr. Kellie Simmering at vein and vascular in [place]. She is also had this  area biopsied. Apparently according the patient's the biopsy showed usual stasis dermatitis. However I have never really seen this look like this on appearance. Electronic Signature(s) Signed: 01/07/2019 5:05:37 PM By: Linton Ham MD Entered By: Linton Ham on 01/07/2019 10:32:31 DONDI, BURANDT ([id number]) -------------------------------------------------------------------------------- SuperBill Details Patient Name: LEGNA, MAUSOLF. Date of Service: 01/07/2019 Medical Record Number: [id number] Patient Account Number: [id number] Date of Birth/Sex: 1946-02-23 (73 y.o. F) Treating RN: Cornell Barman Primary Care Provider: [name] Other Clinician: Referring Provider: [name] Treating Provider/Extender: Tito Dine in Treatment: 4 Diagnosis Coding ICD-10 Codes Code Description 5195400456 Non-pressure chronic ulcer of left calf limited to breakdown of skin I87.321 Chronic venous hypertension (idiopathic) with inflammation of right lower extremity I89.0 Lymphedema, not elsewhere classified Facility Procedures CPT4 Code Description: 78676720 11042 - DEB SUBQ TISSUE 20 SQ CM/< ICD-10 Diagnosis Description L97.221 Non-pressure chronic ulcer of left calf limited to breakdown I87.321 Chronic venous hypertension (idiopathic) with inflammation of Modifier: of skin right lower ext Quantity: 1 remity Physician Procedures CPT4 Code Description: 9470962 11042 - WC PHYS SUBQ TISS 20 SQ CM ICD-10 Diagnosis Description L97.221 Non-pressure chronic ulcer of left calf limited to breakdown I87.321 Chronic venous hypertension (idiopathic) with inflammation of Modifier: of skin right lower extr Quantity: 1 emity Electronic Signature(s) Signed: 01/07/2019 5:05:37 PM By: Linton Ham MD Entered By: Linton Ham on 01/07/2019 10:32:57

## 2019-01-10 NOTE — Progress Notes (Signed)
[name], [name] ([id number]) Visit Report for 01/08/2019 Arrival Information Details Patient Name: [name], [name]. Date of Service: 01/08/2019 1:00 PM Medical Record Number: [id number] Patient Account Number: [id number] Date of Birth/Sex: 04-Mar-1946 (73 y.o. F) Treating RN: Montey Hora Primary Care Marvell Stavola: [name] Other Clinician: Referring Delrose Rohwer: [name] Treating Zaydin Billey/Extender: Melburn Hake, HOYT Weeks in Treatment: 4 Visit Information History Since Last Visit Added or deleted any medications: No Patient Arrived: Ambulatory Any new allergies or adverse reactions: No Arrival Time: 13:13 Had a fall or experienced change in No Accompanied By: self activities of daily living that may affect Transfer Assistance: None risk of falls: Patient Identification Verified: Yes Signs or symptoms of abuse/neglect since last visito No Secondary Verification Process Completed: Yes Hospitalized since last visit: No Implantable device outside of the clinic excluding No cellular tissue based products placed in the center since last visit: Has Dressing in Place as Prescribed: Yes Has Compression in Place as Prescribed: Yes Pain Present Now: Yes Electronic Signature(s) Signed: 01/08/2019 4:54:37 PM By: Montey Hora Entered By: Montey Hora on 01/08/2019 13:14:11 [place], [name] ([id number]) -------------------------------------------------------------------------------- Compression Therapy Details Patient Name: [name], [name]. Date of Service: 01/08/2019 1:00 PM Medical Record Number: [id number] Patient Account Number: [id number] Date of Birth/Sex: May 05, 1946 (73 y.o. F) Treating RN: Montey Hora Primary Care Gemma Ruan: [name] Other Clinician: Referring Saabir Blyth: [name] Treating Aryannah Mohon/Extender: Melburn Hake, HOYT Weeks in Treatment: 4 Compression Therapy Performed for Wound Assessment: Wound #5 Left,Medial Lower Leg Performed By: Clinician Montey Hora, RN Compression Type: Three Layer Pre Treatment ABI: 1 Electronic Signature(s) Signed: 01/08/2019 4:54:37 PM By: Montey Hora Entered By: Montey Hora on 01/08/2019 13:31:02 [name], [name] ([id number]) -------------------------------------------------------------------------------- Encounter Discharge Information Details Patient Name: [name], [name]. Date of Service: 01/08/2019 1:00 PM Medical Record Number: [id number] Patient Account Number: [id number] Date of Birth/Sex: 06/24/1946 (73 y.o. F) Treating RN: Montey Hora Primary Care Wlliam Grosso: [name] Other Clinician: Referring Mouna Yager: [name] Treating Kristyna Bradstreet/Extender: Melburn Hake, HOYT Weeks in Treatment: 4 Encounter Discharge Information Items Discharge Condition: Stable Ambulatory Status: Ambulatory Discharge Destination: Home Transportation: Private Auto Accompanied By: self Schedule Follow-up Appointment: No Clinical Summary of Care: Electronic Signature(s) Signed: 01/08/2019 4:54:37 PM By: Montey Hora Entered By: Montey Hora on 01/08/2019 13:31:26 [name], [name] ([id number]) -------------------------------------------------------------------------------- Wound Assessment Details Patient Name: [name], [name]. Date of Service: 01/08/2019 1:00 PM Medical Record Number: [id number] Patient Account Number: [id number] Date of Birth/Sex: [birth year] (73 y.o. F) Treating RN: Montey Hora Primary Care Valerian Jewel: [name] Other Clinician: Referring Kadeen Sroka: [name] Treating Gailene Youkhana/Extender: STONE III, HOYT Weeks in Treatment: 4 Wound Status Wound Number: 5 Primary Lymphedema Etiology: Wound Location: Left Lower Leg - Medial Wound Open Wounding Event: Gradually Appeared Status: Date Acquired: 11/19/2018 Comorbid Cataracts, Asthma, Sleep Apnea, Deep Vein Weeks Of Treatment: 4 History: Thrombosis, Hypertension, Peripheral Venous Clustered Wound: No Disease,  Osteoarthritis, Received Chemotherapy, Received Radiation Wound Measurements Length: (cm) 3 Width: (cm) 4 Depth: (cm) 0.1 Area: (cm) 9.425 Volume: (cm) 0.942 % Reduction in Area: -10.3% % Reduction in Volume: -10.2% Epithelialization: Small (1-33%) Tunneling: No Undermining: No Wound Description Full Thickness Without Exposed Support Classification: Structures Wound Margin: Indistinct, nonvisible Exudate Medium Amount: Exudate Type: Serosanguineous Exudate Color: red, brown Foul Odor After Cleansing: No Slough/Fibrino Yes Wound Bed Granulation Amount: Large (67-100%) Exposed Structure Granulation Quality: Red, Pink Fascia Exposed: No Necrotic Amount: Small (1-33%) Fat Layer (Subcutaneous Tissue) Exposed: Yes Necrotic Quality: Adherent Slough Tendon Exposed: No Muscle Exposed: No Joint Exposed: No Bone  Exposed: No Periwound Skin Texture Texture Color No Abnormalities Noted: No No Abnormalities Noted: No Callus: No Atrophie Blanche: No Crepitus: No Cyanosis: No Excoriation: No Ecchymosis: No Induration: No Erythema: No Rash: No Hemosiderin Staining: Yes Scarring: No Mottled: No Pallor: No Moisture Rubor: No No Abnormalities Noted: No [name], [name] ([id number]) Dry / Scaly: No Temperature / Pain Maceration: Yes Temperature: No Abnormality Tenderness on Palpation: Yes Wound Preparation Ulcer Cleansing: Rinsed/Irrigated with Saline, Other: soap and water, Topical Anesthetic Applied: None Treatment Notes Wound #5 (Left, Medial Lower Leg) Notes TCA, hydrafera blue, abd, 3-Layer with unna to anchor Electronic Signature(s) Signed: 01/08/2019 4:54:37 PM By: Montey Hora Entered By: Montey Hora on 01/08/2019 13:30:41

## 2019-01-11 NOTE — Progress Notes (Signed)
MANJIT, BUFANO ([id number]) Visit Report for 01/07/2019 Arrival Information Details Patient Name: [name], [name]. Date of Service: 01/07/2019 9:45 AM Medical Record Number: [id number] Patient Account Number: [id number] Date of Birth/Sex: Feb 28, 1946 (73 y.o. F) Treating RN: Harold Barban Primary Care Roxann Vierra: [name] Other Clinician: Referring Prisca Gearing: [name] Treating Latangela Mccomas/Extender: Tito Dine in Treatment: 4 Visit Information History Since Last Visit Added or deleted any medications: No Patient Arrived: Ambulatory Any new allergies or adverse reactions: No Arrival Time: 09:41 Had a fall or experienced change in No Accompanied By: self activities of daily living that may affect Transfer Assistance: None risk of falls: Patient Identification Verified: Yes Signs or symptoms of abuse/neglect since last visito No Secondary Verification Process Completed: Yes Hospitalized since last visit: No Has Dressing in Place as Prescribed: Yes Has Compression in Place as Prescribed: Yes Pain Present Now: No Electronic Signature(s) Signed: 01/09/2019 3:21:09 PM By: Harold Barban Entered By: Harold Barban on 01/07/2019 09:42:33 Vertz, [name] ([id number]) -------------------------------------------------------------------------------- Encounter Discharge Information Details Patient Name: [name], [name]. Date of Service: 01/07/2019 9:45 AM Medical Record Number: [id number] Patient Account Number: [id number] Date of Birth/Sex: 1946/05/24 (73 y.o. F) Treating RN: Army Melia Primary Care Joanne Salah: [name] Other Clinician: Referring Frances Ambrosino: [name] Treating Quinta Eimer/Extender: Tito Dine in Treatment: 4 Encounter Discharge Information Items Post Procedure Vitals Discharge Condition: Stable Temperature (F): 97.9 Ambulatory Status: Ambulatory Pulse (bpm): 59 Discharge Destination: Home Respiratory Rate (breaths/min):  16 Transportation: Private Auto Blood Pressure (mmHg): 139/81 Accompanied By: self Schedule Follow-up Appointment: Yes Clinical Summary of Care: Electronic Signature(s) Signed: 01/07/2019 4:40:46 PM By: Army Melia Entered By: Army Melia on 01/07/2019 10:24:17 Mauney, [name] ([id number]) -------------------------------------------------------------------------------- Lower Extremity Assessment Details Patient Name: [name], [name]. Date of Service: 01/07/2019 9:45 AM Medical Record Number: [id number] Patient Account Number: [id number] Date of Birth/Sex: 06/19/1946 (73 y.o. F) Treating RN: Harold Barban Primary Care Amarrion Pastorino: [name] Other Clinician: Referring Kalin Kyler: [name] Treating Annlouise Gerety/Extender: Tito Dine in Treatment: 4 Edema Assessment Assessed: [Left: No] [Right: No] Edema: [Left: Ye] [Right: s] Calf Left: Right: Point of Measurement: 32 cm From Medial Instep 43 cm cm Ankle Left: Right: Point of Measurement: 12 cm From Medial Instep 24.6 cm cm Vascular Assessment Pulses: Dorsalis Pedis Palpable: [Left:Yes] Posterior Tibial Palpable: [Left:Yes] Extremity colors, hair growth, and conditions: Extremity Color: [Left:Hyperpigmented] Hair Growth on Extremity: [Left:Yes] Temperature of Extremity: [Left:Warm] Capillary Refill: [Left:< 3 seconds] Toe Nail Assessment Left: Right: Thick: Yes Discolored: Yes Deformed: No Improper Length and Hygiene: Yes Electronic Signature(s) Signed: 01/09/2019 3:21:09 PM By: Harold Barban Entered By: Harold Barban on 01/07/2019 09:49:21 [place], [name] ([id number]) -------------------------------------------------------------------------------- Multi Wound Chart Details Patient Name: [name], [name]. Date of Service: 01/07/2019 9:45 AM Medical Record Number: [id number] Patient Account Number: [id number] Date of Birth/Sex: 06/07/1946 (73 y.o. F) Treating RN: Cornell Barman Primary Care Stefanos Haynesworth:  [name] Other Clinician: Referring Lyndsee Casa: [name] Treating Derelle Cockrell/Extender: Tito Dine in Treatment: 4 Vital Signs Height(in): 63 Pulse(bpm): 27 Weight(lbs): 224.7 Blood Pressure(mmHg): 139/81 Body Mass Index(BMI): 40 Temperature(F): 97.5 Respiratory Rate 18 (breaths/min): Photos: [N/A:N/A] Wound Location: Left Lower Leg - Medial N/A N/A Wounding Event: Gradually Appeared N/A N/A Primary Etiology: Lymphedema N/A N/A Comorbid History: Cataracts, Asthma, Sleep N/A N/A Apnea, Deep Vein Thrombosis, Hypertension, Peripheral Venous Disease, Osteoarthritis, Received Chemotherapy, Received Radiation Date Acquired: 11/19/2018 N/A N/A Weeks of Treatment: 4 N/A N/A Wound Status: Open N/A N/A Measurements L x W x D 3x4x0.1 N/A N/A (  cm) Area (cm) : 9.425 N/A N/A Volume (cm) : 0.942 N/A N/A % Reduction in Area: -10.30% N/A N/A % Reduction in Volume: -10.20% N/A N/A Classification: Full Thickness Without N/A N/A Exposed Support Structures Exudate Amount: Medium N/A N/A Exudate Type: Serosanguineous N/A N/A Exudate Color: red, brown N/A N/A Wound Margin: Indistinct, nonvisible N/A N/A Granulation Amount: Small (1-33%) N/A N/A Granulation Quality: Red, Pink N/A N/A Necrotic Amount: Large (67-100%) N/A N/A Exposed Structures: N/A N/A [name], [name] ([id number]) Fat Layer (Subcutaneous Tissue) Exposed: Yes Fascia: No Tendon: No Muscle: No Joint: No Bone: No Epithelialization: Small (1-33%) N/A N/A Debridement: Debridement - Excisional N/A N/A Pre-procedure 10:03 N/A N/A Verification/Time Out Taken: Pain Control: Lidocaine N/A N/A Tissue Debrided: Subcutaneous, Slough N/A N/A Level: Skin/Subcutaneous Tissue N/A N/A Debridement Area (sq cm): 12 N/A N/A Instrument: Curette N/A N/A Bleeding: Moderate N/A N/A Hemostasis Achieved: Pressure N/A N/A Debridement Treatment Procedure was tolerated well N/A N/A Response: Post Debridement  3x4x0.2 N/A N/A Measurements L x W x D (cm) Post Debridement Volume: 1.885 N/A N/A (cm) Periwound Skin Texture: Excoriation: No N/A N/A Induration: No Callus: No Crepitus: No Rash: No Scarring: No Periwound Skin Moisture: Maceration: Yes N/A N/A Dry/Scaly: No Periwound Skin Color: Hemosiderin Staining: Yes N/A N/A Atrophie Blanche: No Cyanosis: No Ecchymosis: No Erythema: No Mottled: No Pallor: No Rubor: No Temperature: No Abnormality N/A N/A Tenderness on Palpation: Yes N/A N/A Wound Preparation: Ulcer Cleansing: N/A N/A Rinsed/Irrigated with Saline, Other: soap and water Topical Anesthetic Applied: None, Other: lidocaine 4% Procedures Performed: Debridement N/A N/A Treatment Notes Electronic Signature(s) Signed: 01/07/2019 5:05:37 PM By: Linton Ham MD Entered By: Linton Ham on 01/07/2019 10:14:20 LIYAH, HIGHAM ([id number]) LATIF, [name] ([id number]) -------------------------------------------------------------------------------- Multi-Disciplinary Care Plan Details Patient Name: [name], [name]. Date of Service: 01/07/2019 9:45 AM Medical Record Number: [id number] Patient Account Number: [id number] Date of Birth/Sex: 1945/12/27 (73 y.o. F) Treating RN: Cornell Barman Primary Care Edword Cu: [name] Other Clinician: Referring Tessy Pawelski: [name] Treating Maksim Peregoy/Extender: Tito Dine in Treatment: 4 Active Inactive Orientation to the Wound Care Program Nursing Diagnoses: Knowledge deficit related to the wound healing center program Goals: Patient/caregiver will verbalize understanding of the [facility] Date Initiated: 12/10/2018 Target Resolution Date: 01/09/2019 Goal Status: Active Interventions: Provide education on orientation to the wound center Notes: Soft Tissue Infection Nursing Diagnoses: Impaired tissue integrity Goals: Patient's soft tissue infection will resolve Date Initiated:  12/10/2018 Target Resolution Date: 01/09/2019 Goal Status: Active Interventions: Assess signs and symptoms of infection every visit Notes: Venous Leg Ulcer Nursing Diagnoses: Actual venous Insuffiency (use after diagnosis is confirmed) Goals: Patient will maintain optimal edema control Date Initiated: 12/10/2018 Target Resolution Date: 01/09/2019 Goal Status: Active Interventions: Assess peripheral edema status every visit. DAVINE, SWENEY ([id number]) Treatment Activities: Therapeutic compression applied : 12/10/2018 Notes: Wound/Skin Impairment Nursing Diagnoses: Impaired tissue integrity Goals: Patient/caregiver will verbalize understanding of skin care regimen Date Initiated: 12/10/2018 Target Resolution Date: 01/09/2019 Goal Status: Active Interventions: Assess ulceration(s) every visit Treatment Activities: Topical wound management initiated : 12/10/2018 Notes: Electronic Signature(s) Signed: 01/07/2019 5:29:19 PM By: [name], [name], RN, CWS, [name], [name] Entered By: [name], [name], RN, CWS, Kim on 01/07/2019 10:02:58 [name] ([id number]) -------------------------------------------------------------------------------- Pain Assessment Details Patient Name: [name], [name]. Date of Service: 01/07/2019 9:45 AM Medical Record Number: [id number] Patient Account Number: [id number] Date of Birth/Sex: 31-Dec-1945 (73 y.o. F) Treating RN: Harold Barban Primary Care Athanasius Kesling: [name] Other Clinician: Referring Ieisha Gao: [name] Treating Sevyn Paredez/Extender:  ROBSON, MICHAEL G Weeks in Treatment: 4 Active Problems Location of Pain Severity and Description of Pain Patient Has Paino Yes Site Locations Pain Location: Pain in Ulcers Rate the pain. Current Pain Level: 2 Character of Pain Describe the Pain: Burning Pain Management and Medication Current Pain Management: Notes Patient states the wound burns at night Electronic Signature(s) Signed: 01/09/2019 3:21:09 PM By:  Harold Barban Entered By: Harold Barban on 01/07/2019 09:44:38 Surita, [name] ([id number]) -------------------------------------------------------------------------------- Patient/Caregiver Education Details Patient Name: [name], [name]. Date of Service: 01/07/2019 9:45 AM Medical Record Number: [id number] Patient Account Number: [id number] Date of Birth/Gender: Sep 06, 1946 (73 y.o. F) Treating RN: Cornell Barman Primary Care Physician: [name] Other Clinician: Referring Physician: [name] Treating Physician/Extender: Tito Dine in Treatment: 4 Education Assessment Education Provided To: Patient Education Topics Provided Venous: Handouts: Controlling Swelling with Multilayered Compression Wraps Methods: Demonstration, Explain/Verbal Responses: State content correctly Wound Debridement: Handouts: Wound Debridement Methods: Demonstration, Explain/Verbal Responses: State content correctly Wound/Skin Impairment: Handouts: Caring for Your Ulcer, Other: continue wound care as prscribed Methods: Demonstration, Explain/Verbal Responses: State content correctly Electronic Signature(s) Signed: 01/07/2019 5:29:19 PM By: [name], [name], RN, CWS, [name], [name] Entered By: [name], [name], RN, CWS, Kim on 01/07/2019 10:07:17 [name] ([id number]) -------------------------------------------------------------------------------- Wound Assessment Details Patient Name: [name], [name]. Date of Service: 01/07/2019 9:45 AM Medical Record Number: [id number] Patient Account Number: [id number] Date of Birth/Sex: 06-23-1946 (73 y.o. F) Treating RN: Harold Barban Primary Care Jamaar Howes: [name] Other Clinician: Referring Moria Brophy: [name] Treating Jorge Retz/Extender: Tito Dine in Treatment: 4 Wound Status Wound Number: 5 Primary Lymphedema Etiology: Wound Location: Left Lower Leg - Medial Wound Open Wounding Event: Gradually  Appeared Status: Date Acquired: 11/19/2018 Comorbid Cataracts, Asthma, Sleep Apnea, Deep Vein Weeks Of Treatment: 4 History: Thrombosis, Hypertension, Peripheral Venous Clustered Wound: No Disease, Osteoarthritis, Received Chemotherapy, Received Radiation Photos Wound Measurements Length: (cm) 3 Width: (cm) 4 Depth: (cm) 0.1 Area: (cm) 9.425 Volume: (cm) 0.942 % Reduction in Area: -10.3% % Reduction in Volume: -10.2% Epithelialization: Small (1-33%) Tunneling: No Undermining: No Wound Description Full Thickness Without Exposed Support Classification: Structures Wound Margin: Indistinct, nonvisible Exudate Medium Amount: Exudate Type: Serosanguineous Exudate Color: red, brown Foul Odor After Cleansing: No Slough/Fibrino Yes Wound Bed Granulation Amount: Small (1-33%) Exposed Structure Granulation Quality: Red, Pink Fascia Exposed: No Necrotic Amount: Large (67-100%) Fat Layer (Subcutaneous Tissue) Exposed: Yes Necrotic Quality: Adherent Slough Tendon Exposed: No Muscle Exposed: No Joint Exposed: No Bone Exposed: No [name], [name] ([id number]) Periwound Skin Texture Texture Color No Abnormalities Noted: No No Abnormalities Noted: No Callus: No Atrophie Blanche: No Crepitus: No Cyanosis: No Excoriation: No Ecchymosis: No Induration: No Erythema: No Rash: No Hemosiderin Staining: Yes Scarring: No Mottled: No Pallor: No Moisture Rubor: No No Abnormalities Noted: No Dry / Scaly: No Temperature / Pain Maceration: Yes Temperature: No Abnormality Tenderness on Palpation: Yes Wound Preparation Ulcer Cleansing: Rinsed/Irrigated with Saline, Other: soap and water, Topical Anesthetic Applied: None, Other: lidocaine 4%, Electronic Signature(s) Signed: 01/09/2019 3:21:09 PM By: Harold Barban Entered By: Harold Barban on 01/07/2019 09:53:35 [name], [name]  ([id number]) -------------------------------------------------------------------------------- Vitals Details Patient Name: [name], [name]. Date of Service: 01/07/2019 9:45 AM Medical Record Number: [id number] Patient Account Number: [id number] Date of Birth/Sex: [birth year] (73 y.o. F) Treating RN: Harold Barban Primary Care Laquanta Hummel: [name] Other Clinician: Referring Vana Arif: [name] Treating Taffie Eckmann/Extender: Tito Dine in Treatment: 4 Vital Signs Time Taken: 09:44 Temperature (F): 97.5 Height (in): 63 Pulse (bpm):  59 Weight (lbs): 224.7 Respiratory Rate (breaths/min): 18 Body Mass Index (BMI): 39.8 Blood Pressure (mmHg): 139/81 Reference Range: 80 - 120 mg / dl Electronic Signature(s) Signed: 01/09/2019 3:21:09 PM By: Harold Barban Entered By: Harold Barban on 01/07/2019 09:45:34

## 2019-01-12 DIAGNOSIS — Z86718 Personal history of other venous thrombosis and embolism: Secondary | ICD-10-CM | POA: Diagnosis not present

## 2019-01-12 DIAGNOSIS — I10 Essential (primary) hypertension: Secondary | ICD-10-CM | POA: Diagnosis not present

## 2019-01-12 DIAGNOSIS — L97221 Non-pressure chronic ulcer of left calf limited to breakdown of skin: Secondary | ICD-10-CM | POA: Diagnosis not present

## 2019-01-12 DIAGNOSIS — I872 Venous insufficiency (chronic) (peripheral): Secondary | ICD-10-CM | POA: Diagnosis not present

## 2019-01-12 DIAGNOSIS — I890 Lymphedema, not elsewhere classified: Secondary | ICD-10-CM | POA: Diagnosis not present

## 2019-01-12 DIAGNOSIS — J45909 Unspecified asthma, uncomplicated: Secondary | ICD-10-CM | POA: Diagnosis not present

## 2019-01-13 NOTE — Progress Notes (Signed)
[name] ([id number]) Visit Report for 01/12/2019 Arrival Information Details Patient Name: [name], [name]. Date of Service: 01/12/2019 2:45 PM Medical Record Number: [id number] Patient Account Number: [id number] Date of Birth/Sex: 19-Nov-1945 (73 y.o. F) Treating RN: Montey Hora Primary Care Gleen Ripberger: [name] Other Clinician: Referring Greenley Martone: [name] Treating Zamir Staples/Extender: Melburn Hake, HOYT Weeks in Treatment: 4 Visit Information History Since Last Visit Added or deleted any medications: No Patient Arrived: Ambulatory Any new allergies or adverse reactions: No Arrival Time: 15:26 Had a fall or experienced change in No Accompanied By: self activities of daily living that may affect Transfer Assistance: None risk of falls: Patient Identification Verified: Yes Signs or symptoms of abuse/neglect since last visito No Secondary Verification Process Completed: Yes Hospitalized since last visit: No Implantable device outside of the clinic excluding No cellular tissue based products placed in the center since last visit: Has Dressing in Place as Prescribed: Yes Has Compression in Place as Prescribed: Yes Pain Present Now: No Electronic Signature(s) Signed: 01/12/2019 4:27:26 PM By: Montey Hora Entered By: Montey Hora on 01/12/2019 15:26:19 Schlicker, [name] ([id number]) -------------------------------------------------------------------------------- Compression Therapy Details Patient Name: [name]. Date of Service: 01/12/2019 2:45 PM Medical Record Number: [id number] Patient Account Number: [id number] Date of Birth/Sex: February 19, 1946 (73 y.o. F) Treating RN: Montey Hora Primary Care Shakima Nisley: [name] Other Clinician: Referring Nina Hoar: [name] Treating Makylah Bossard/Extender: Melburn Hake, HOYT Weeks in Treatment: 4 Compression Therapy Performed for Wound Assessment: Wound #5 Left,Medial Lower Leg Performed By: Clinician Montey Hora, RN Compression Type: Three Layer Pre Treatment ABI: 1 Electronic Signature(s) Signed: 01/12/2019 4:27:26 PM By: Montey Hora Entered By: Montey Hora on 01/12/2019 15:26:57 Bialas, [name] ([id number]) -------------------------------------------------------------------------------- Encounter Discharge Information Details Patient Name: [name]. Date of Service: 01/12/2019 2:45 PM Medical Record Number: [id number] Patient Account Number: [id number] Date of Birth/Sex: 01/05/1946 (73 y.o. F) Treating RN: Montey Hora Primary Care Daisie Haft: [name] Other Clinician: Referring Damyah Gugel: [name] Treating Ardine Iacovelli/Extender: Melburn Hake, HOYT Weeks in Treatment: 4 Encounter Discharge Information Items Discharge Condition: Stable Ambulatory Status: Ambulatory Discharge Destination: Home Transportation: Private Auto Accompanied By: self Schedule Follow-up Appointment: No Clinical Summary of Care: Electronic Signature(s) Signed: 01/12/2019 4:27:26 PM By: Montey Hora Entered By: Montey Hora on 01/12/2019 15:30:16 Rarick, [name] ([id number]) -------------------------------------------------------------------------------- Wound Assessment Details Patient Name: [name]. Date of Service: 01/12/2019 2:45 PM Medical Record Number: [id number] Patient Account Number: [id number] Date of Birth/Sex: June 16, 1946 (73 y.o. F) Treating RN: Montey Hora Primary Care Marshell Dilauro: [name] Other Clinician: Referring Trevaris Pennella: [name] Treating Jurni Cesaro/Extender: STONE III, HOYT Weeks in Treatment: 4 Wound Status Wound Number: 5 Primary Lymphedema Etiology: Wound Location: Left Lower Leg - Medial Wound Open Wounding Event: Gradually Appeared Status: Date Acquired: 11/19/2018 Comorbid Cataracts, Asthma, Sleep Apnea, Deep Vein Weeks Of Treatment: 4 History: Thrombosis, Hypertension, Peripheral Venous Clustered Wound: No Disease,  Osteoarthritis, Received Chemotherapy, Received Radiation Wound Measurements Length: (cm) 3 Width: (cm) 4 Depth: (cm) 0.1 Area: (cm) 9.425 Volume: (cm) 0.942 % Reduction in Area: -10.3% % Reduction in Volume: -10.2% Epithelialization: Small (1-33%) Tunneling: No Undermining: No Wound Description Full Thickness Without Exposed Support Classification: Structures Wound Margin: Indistinct, nonvisible Exudate Medium Amount: Exudate Type: Serosanguineous Exudate Color: red, brown Foul Odor After Cleansing: No Slough/Fibrino Yes Wound Bed Granulation Amount: Large (67-100%) Exposed Structure Granulation Quality: Red, Pink Fascia Exposed: No Necrotic Amount: Small (1-33%) Fat Layer (Subcutaneous Tissue) Exposed: Yes Necrotic Quality: Adherent Slough Tendon Exposed: No Muscle Exposed: No Joint Exposed: No Bone  Exposed: No Periwound Skin Texture Texture Color No Abnormalities Noted: No No Abnormalities Noted: No Callus: No Atrophie Blanche: No Crepitus: No Cyanosis: No Excoriation: No Ecchymosis: No Induration: No Erythema: No Rash: No Hemosiderin Staining: Yes Scarring: No Mottled: No Pallor: No Moisture Rubor: No No Abnormalities Noted: No [name] ([id number]) Dry / Scaly: No Temperature / Pain Maceration: Yes Temperature: No Abnormality Tenderness on Palpation: Yes Wound Preparation Ulcer Cleansing: Rinsed/Irrigated with Saline, Other: soap and water, Topical Anesthetic Applied: None Treatment Notes Wound #5 (Left, Medial Lower Leg) Notes TCA, hydrafera blue, abd, 3-Layer with unna to anchor Electronic Signature(s) Signed: 01/12/2019 4:27:26 PM By: Montey Hora Entered By: Montey Hora on 01/12/2019 15:26:39

## 2019-01-14 ENCOUNTER — Other Ambulatory Visit: Payer: Self-pay

## 2019-01-14 ENCOUNTER — Encounter: Payer: Medicare Other | Admitting: Internal Medicine

## 2019-01-14 DIAGNOSIS — I890 Lymphedema, not elsewhere classified: Secondary | ICD-10-CM | POA: Diagnosis not present

## 2019-01-14 DIAGNOSIS — J45909 Unspecified asthma, uncomplicated: Secondary | ICD-10-CM | POA: Diagnosis not present

## 2019-01-14 DIAGNOSIS — I10 Essential (primary) hypertension: Secondary | ICD-10-CM | POA: Diagnosis not present

## 2019-01-14 DIAGNOSIS — L97222 Non-pressure chronic ulcer of left calf with fat layer exposed: Secondary | ICD-10-CM | POA: Diagnosis not present

## 2019-01-14 DIAGNOSIS — L97221 Non-pressure chronic ulcer of left calf limited to breakdown of skin: Secondary | ICD-10-CM | POA: Diagnosis not present

## 2019-01-14 DIAGNOSIS — I872 Venous insufficiency (chronic) (peripheral): Secondary | ICD-10-CM | POA: Diagnosis not present

## 2019-01-14 DIAGNOSIS — Z86718 Personal history of other venous thrombosis and embolism: Secondary | ICD-10-CM | POA: Diagnosis not present

## 2019-01-15 NOTE — Progress Notes (Signed)
MARKA, TRELOAR ([id number]) Visit Report for 01/14/2019 [name], [name]. Date of Service: 01/14/2019 10:45 AM Medical Record Number: [id number] Amber Account Number: [id number] Date of Birth/Sex: 05/23/1946 (73 y.o. F) Treating RN: Cornell Barman Primary Care Provider: [name] Other Clinician: Referring Provider: [name] Treating Provider/Extender: Tito Dine in Treatment: 5 Amber Performed for Wound #5 Left,Medial Lower Leg Assessment: Performed By: Physician [name] Dillon, MD Amber Type: Amber Level of Consciousness (Pre- Awake and Alert procedure): Pre-procedure Verification/Time Yes - 11:44 Out Taken: Start Time: 11:44 Pain Control: Lidocaine Total Area Debrided (L x W): 2.9 (cm) x 3 (cm) = 8.7 (cm) Tissue and other material Viable, Non-Viable, Slough, Subcutaneous, Biofilm, Slough debrided: Level: Skin/Subcutaneous Tissue Amber Description: Excisional Instrument: Curette Bleeding: Moderate Hemostasis Achieved: Pressure End Time: 11:48 Response to Treatment: Procedure was tolerated well Level of Consciousness Awake and Alert (Post-procedure): Post Amber Measurements of Total Wound Length: (cm) 2.9 Width: (cm) 3 Depth: (cm) 0.1 Volume: (cm) 0.683 Character of Wound/Ulcer Post Amber: Stable Post Procedure Diagnosis Same as Pre-procedure Electronic Signature(s) Signed: 01/14/2019 5:23:21 PM By: [name], [name], RN, CWS, [name], [name] Signed: 01/14/2019 5:42:07 PM By: Linton Ham MD Entered By: Linton Ham on 01/14/2019 12:35:21 JAZLYNE, GAUGER ([id number]) -------------------------------------------------------------------------------- HPI Details [name]. Date of Service: 01/14/2019 10:45 AM Medical Record Number: [id number] Amber Account Number: [id number] Date of Birth/Sex: 07-30-1946 (73 y.o. F) Treating RN: Cornell Barman Primary Care  Provider: [name] Other Clinician: Referring Provider: [name] Treating Provider/Extender: Tito Dine in Treatment: 5 History of Present Illness HPI Description: Pleasant 73 year old with history of chronic venous insufficiency. No diabetes or peripheral vascular disease. Left ABI 1.29. Questionable history of left lower extremity DVT. She developed a recurrent ulceration on her left lateral calf in December 2015, which she attributes to poor diet and subsequent lower extremity edema. She underwent endovenous laser ablation of her left greater saphenous vein in 2010. She underwent laser ablation of accessory branch of left GSV in April 2016 by Dr. Kellie Simmering at [facility]. She was previously wearing Unna boots, which she tolerated well. Tolerating 2 layer compression and cadexomer iodine. She returns to clinic for follow-up and is without new complaints. She denies any significant pain at this time. She reports persistent pain with pressure. No claudication or ischemic rest pain. No fever or chills. No drainage. READMISSION 11/13/16; this is a 73 year old woman who is not a diabetic. She is here for a review of a painful area on her left medial lower extremity. I note that she was seen here previously last year for wound I believe to be in the same area. At that time she had undergone previously a left greater saphenous vein ablation by Dr. Kellie Simmering and she had a ablation of the anterior accessory branch of the left greater saphenous vein in March 2016. Seeing that the wound actually closed over. In reviewing the history with her today the ulcer in this area has been recurrent. She describes a biopsy of this area in 2009 that only showed stasis physiology. 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 Do not see that she has ever had arterial studies in the left leg. She tells me she has a follow-up  with Dr. Kellie Simmering in roughly 10 days In any case she developed the reopening of this area roughly a month ago. On the background of this she describes rapidly increasing edema which has responded to Lasix 40 mg and metolazone 2.5 mg as well as the Amber's lymph massage. She has been told she has both venous insufficiency and lymphedema but she cannot tolerate compression stockings 11/28/16; the Amber saw Dr. Kellie Simmering recently. Per the Amber he did arterial Dopplers in the office that did not show evidence of arterial insufficiency, per the Amber he stated "treat this like an ordinary venous ulcer". She also saw her dermatologist Dr. Ronnald Ramp who felt that this was more of a vascular ulcer. In general things are improving although she arrives today with increasing bilateral lower extremity edema with weeping a deeper fluid through the wound on the left medial leg compatible with some degree of lymphedema 12/04/16; the Amber's wound is fully epithelialized but I don't think fully healed. We will do another week of depression with Promogran and TCA however I suspect we'll be able to discharge her next week. This is a very unusual-looking wound which was initially a figure-of-eight type wound lying on its side surrounded by petechial like hemorrhage. She has had venous ablation on this side. She apparently does not have an arterial issue per Dr. Kellie Simmering. She saw her dermatologist thought it was "vascular". Amber is definitely going to need ongoing compression and I talked about this with her today she will go to elastic therapy after she leaves here next week 12/11/16; the Amber's wound is not completely closed today. She has surrounding scar tissue and in further discussion with the Amber it would appear that she had ulcers in this area in 2009 for a  prolonged period of time ultimately requiring a punch biopsy of this area that only showed venous insufficiency. I did not previously pickup on this part of the history from the Amber. 12/18/16; the Amber's wound is completely epithelialized. There is no open area here. She has significant bilateral venous insufficiency with secondary lymphedema to a mild-to-moderate degree she does not have compression stockings.. She did not say anything to me when I was in the room, she told our intake nurse that she was still having pain in this area. This isn't unusual recurrent small open area. She is going to go to elastic therapy to obtain compression stockings. 12/25/16; the Amber's wound is fully epithelialized. There is no open area here. The Amber describes some continued episodic discomfort in this area medial left calf. However everything looks fine and healed here. She is been to elastic therapy and caught herself 15-20 mmHg stockings, they apparently were having trouble getting 20-30 mm stockings in her size MEGAHN, SHORTY ([id number]) 01/22/17; this is a Amber we discharged from the clinic a month ago. She has a recurrent open wound on her medial left calf. She had 15 mm support stockings. I told her I thought she needed 20-30 mm compression stockings. She tells me that she has been ill with hospitalization secondary to asthma and is been found to have severe hypokalemia likely secondary to a combination of Lasix and metolazone. This morning she noted blistering and leaking fluid on the posterior part of her left leg. She called our intake nurse urgently and we was saw her this afternoon. She has not had any real discomfort here. I don't know that she's been wearing any stockings on this leg for at least 2-3 days. ABIs in this  clinic were 1.21 on the right and 1.3 on the left. She is previously seen vascular surgery who does not think that there is a peripheral arterial issue. 01/30/17;  Amber arrives with no open wound on the left leg. She has been to elastic therapy and obtained 20-14mmhg below knee stockings and she has one on the right leg today. READMISSION 02/19/18; this Clagett is a now 73 year old Amber we've had in this clinic perhaps 3 times before. I had last looked at her from January 07 December 2016 with an area on the medial left leg. We discharged her on 12/25/16 however she had to be readmitted on 01/22/17 with a recurrence. I have in my notes that we discharged her on 20-30 mm stockings although she tells me she was only wearing support hose because she cannot get stockings on predominantly related to her cervical spine surgery/issues. She has had previous ablations done by vein and vascular in [place] including a great saphenous vein ablation on the left with an anterior accessory branch ablation I think both of these were in 2016. On one of the previous visit she had a biopsy noted 2009 that was negative. She is not felt to have an arterial issue. She is not a diabetic. She does have a history of obstructive sleep apnea hypertension asthma as well as chronic venous insufficiency and lymphedema. On this occasion she noted 2 dry scaly patch on her left leg. She tried to put lotion on this it didn't really help. There were 2 open areas.the Amber has been seeing her primary physician from 02/05/18 through 02/14/18. She had Unna boots applied. The superior wound now on the lateral left leg has closed but she's had one wound that remains open on the lateral left leg. This is not the same spot as we dealt with in 2018. ABIs in this clinic were 1.3 bilaterally 02/26/18; Amber has a small wound on the left lateral calf. Dimensions are down. She has chronic venous insufficiency and lymphedema. 03/05/18; small open area on the left lateral calf. Dimensions are down. Tightly adherent necrotic debris over the surface of the wound which was difficult to remove. Also the  dressing [over collagen] stuck to the wound surface. This was removed with some difficulty as well. Change the primary dressing to Hydrofera Blue ready 03/12/18; small open area on the left lateral calf. Comes in with tightly adherent surface eschar as well as some adherent Hydrofera Blue. 03/19/18; open area on the left lateral calf. Again adherent surface eschar as well as some adherent Hydrofera Blue nonviable subcutaneous tissue. She complained of pain all week even with the reduction from 4-3 layer compression I put on last week. Also she had an increase in her ankle and calf measurements probably related to the same thing. 03/26/18; open area on the left lateral calf. A very small open area remains here. We used silver alginate starting last week as the Hydrofera Blue seem to stick to the wound bed. In using 4-layer compression 04/02/18; the open area in the left lateral calf at some adherent slough which I removed there is no open area here. We are able to transition her into her own compression stocking. Truthfully I think this is probably his support hose. However this does not maintain skin integrity will be limited. She cannot put over the toe compression stockings on because of neck problems hand problems etc. She is allergic to the lining layer of juxta lites. We might be forced to use extremitease stocking  should this fail READMIT 11/24/2018 Amber is now a 73 year old woman who is not a diabetic. She has been in this clinic on at least 3 previous occasions largely with recurrent wounds on her left leg secondary to chronic venous insufficiency with secondary lymphedema. Her situation is complicated by inability to get stockings on and an allergy to neoprene which is apparently a component and at least juxta lites and other stockings. As a result she really has not been wearing any stockings on her legs. She tells [place] that roughly 2 or 3 weeks ago she started noticing a stinging sensation  just above her ankle on the left medial aspect. She has been diagnosed with pseudogout and she wondered whether this was what she was experiencing. She tried to dress this with something she bought at the store however subsequently it pulled skin off and now she has an open wound that is not improving. She has been using Vaseline gauze with a cover bandage. She saw her primary doctor last week who put an Haematologist on her. ABIs in this clinic was 1.03 on the left BOBIE, CARIS. ([id number]) 2/12; the area is on the left medial ankle. Odd-looking wound with what looks to be surface epithelialization but a multitude of small petechial openings. This clearly not closed yet. We have been using silver alginate under 3 layer compression with TCA 2/19; the wound area did not look quite as good this week. Necrotic debris over the majority of the wound surface which required Amber. She continues to have a multitude of what looked to be small petechial openings. She reminds [place] that she had a biopsy on this initially during her first outbreak in 2015 in [place] [facility]. She expresses concern about this being a possible melanoma. 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 I assured her that this does not look anything like melanoma. Except for the petechial reaction it does look like a venous insufficiency area and she certainly has evidence of this on both sides 2/26; a difficult area on the left medial ankle. The Amber clearly has chronic venous hypertension with some degree of lymphedema. The odd thing about the area is the small petechial hemorrhages. I am not really sure how to explain this. This was present last time and this is not a compression injury. We have been using Hydrofera Blue which I changed to last week 3/4; still using Hydrofera Blue. Aggressive Amber today. She does not have known  arterial issues. She has seen Dr. Kellie Simmering at [facility] vein [facility] vascular and and has an ablation on the left. [Anterior accessory branch of the greater saphenous]. From what I remember they did not feel she had an arterial issue. The Amber has had this area biopsied in 2009 at [facility] dermatology and by her recollection they said this was "stasis". She is also follow-up with dermatology locally who thought that this was more of a vascular issue 3/11; using Hydrofera Blue. Aggressive Amber today. She does not have an arterial issue. We are using 3 layer compression although we may need to go to 4. The Amber has been in for multiple changes to her wrap since I last saw her a week ago. She says that the area was leaking. I do not have too much more information on what was found Electronic Signature(s) Signed: 01/14/2019 5:42:07 PM By: Linton Ham MD Entered By: Linton Ham on 01/14/2019 12:41:27 Carpino, Vaughan Basta  Lenna Sciara ([id number]) -------------------------------------------------------------------------------- Physical Exam Details Amber Name: ALEKSANDRA, RABEN. Date of Service: 01/14/2019 10:45 AM Medical Record Number: [id number] Amber Account Number: [id number] Date of Birth/Sex: 23-Feb-1946 (73 y.o. F) Treating RN: Cornell Barman Primary Care Provider: [name] Other Clinician: Referring Provider: [name] Treating Provider/Extender: Tito Dine in Treatment: 5 Constitutional Amber is hypertensive.. Pulse regular and within target range for Amber.Marland Kitchen Respirations regular, non-labored and within target range.. Temperature is normal and within the target range for the Amber.Marland Kitchen appears in no distress. Notes Wound exam; the wound measured slightly smaller today however still not a viable surface. Another aggressive Amber in this area. She has very clearly chronic venous hypertension with dilated small veins on the medial aspect of her  feet Electronic Signature(s) Signed: 01/14/2019 5:42:07 PM By: Linton Ham MD Entered By: Linton Ham on 01/14/2019 12:42:25 [name] ([id number]) -------------------------------------------------------------------------------- Physician Orders Details Amber Name: CERISE, LIEBER. Date of Service: 01/14/2019 10:45 AM Medical Record Number: [id number] Amber Account Number: [id number] Date of Birth/Sex: 1945/12/11 (73 y.o. F) Treating RN: Cornell Barman Primary Care Provider: [name] Other Clinician: Referring Provider: [name] Treating Provider/Extender: Tito Dine in Treatment: 5 Verbal / Phone Orders: No Diagnosis Coding Wound Cleansing Wound #5 Left,Medial Lower Leg o Clean wound with Normal Saline. o May Shower, gently pat wound dry prior to applying new dressing. Anesthetic (add to Medication List) Wound #5 Left,Medial Lower Leg o Topical Lidocaine 4% cream applied to wound bed prior to Amber (In Clinic Only). o Benzocaine Topical Anesthetic Spray applied to wound bed prior to Amber (In Clinic Only). Skin Barriers/Peri-Wound Care Wound #5 Left,Medial Lower Leg o Triamcinolone Acetonide Ointment (TCA) - on wound and peri-wound Primary Wound Dressing Wound #5 Left,Medial Lower Leg o Hydrafera Blue Ready Transfer Secondary Dressing Wound #5 Left,Medial Lower Leg o XtraSorb Dressing Change Frequency Wound #5 Left,Medial Lower Leg o Change dressing every week Follow-up Appointments Wound #5 Left,Medial Lower Leg o Return Appointment in 1 week. o Nurse Visit as needed Edema Control Wound #5 Left,Medial Lower Leg o 3 Layer Compression System - Left Lower Extremity Electronic Signature(s) Signed: 01/14/2019 5:23:21 PM By: [name], [name], RN, CWS, [name], [name] Signed: 01/14/2019 5:42:07 PM By: Linton Ham MD Entered By: [name], [name], RN, CWS, Kim on 01/14/2019 11:50:30 SYLENA, LOTTER  ([id number]) ARDETH, REPETTO ([id number]) -------------------------------------------------------------------------------- Problem List Details Amber Name: SINIA, ANTOSH. Date of Service: 01/14/2019 10:45 AM Medical Record Number: [id number] Amber Account Number: [id number] Date of Birth/Sex: Mar 09, 1946 (73 y.o. F) Treating RN: Cornell Barman Primary Care Provider: [name] Other Clinician: Referring Provider: [name] Treating Provider/Extender: Tito Dine in Treatment: 5 Active Problems ICD-10 Evaluated Encounter Code Description Active Date Today Diagnosis L97.221 Non-pressure chronic ulcer of left calf limited to breakdown of 01/07/2019 No Yes skin I87.321 Chronic venous hypertension (idiopathic) with inflammation of 12/10/2018 No Yes right lower extremity I89.0 Lymphedema, not elsewhere classified 12/10/2018 No Yes Inactive Problems Resolved Problems Electronic Signature(s) Signed: 01/14/2019 5:42:07 PM By: Linton Ham MD Entered By: Linton Ham on 01/14/2019 12:33:23 [name] ([id number]) -------------------------------------------------------------------------------- Progress Note Details Amber Name: KITRINA, MAURIN. Date of Service: 01/14/2019 10:45 AM Medical Record Number: [id number] Amber Account Number: [id number] Date of Birth/Sex: [birth year] (73 y.o. F) Treating RN: Cornell Barman Primary Care Provider: [name] Other Clinician: Referring Provider: [name] Treating Provider/Extender: Tito Dine in Treatment: 5 Subjective History of Present Illness (HPI) Pleasant 73 year old with history of chronic venous insufficiency.  No diabetes or peripheral vascular disease. Left ABI 1.29. Questionable history of left lower extremity DVT. She developed a recurrent ulceration on her left lateral calf in December 2015, which she attributes to poor diet and subsequent lower extremity edema. She underwent  endovenous laser ablation of her left greater saphenous vein in 2010. She underwent laser ablation of accessory branch of left GSV in April 2016 by Dr. Kellie Simmering at [facility]. She was previously wearing Unna boots, which she tolerated well. Tolerating 2 layer compression and cadexomer iodine. She returns to clinic for follow-up and is without new complaints. She denies any significant pain at this time. She reports persistent pain with pressure. No claudication or ischemic rest pain. No fever or chills. No drainage. READMISSION 11/13/16; this is a 73 year old woman who is not a diabetic. She is here for a review of a painful area on her left medial lower extremity. I note that she was seen here previously last year for wound I believe to be in the same area. At that time she had undergone previously a left greater saphenous vein ablation by Dr. Kellie Simmering and she had a ablation of the anterior accessory branch of the left greater saphenous vein in March 2016. Seeing that the wound actually closed over. In reviewing the history with her today the ulcer in this area has been recurrent. She describes a biopsy of this area in 2009 that only showed stasis physiology. 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 Do not see that she has ever had arterial studies in the left leg. She tells me she has a follow-up with Dr. Kellie Simmering in roughly 10 days In any case she developed the reopening of this area roughly a month ago. On the background of this she describes rapidly increasing edema which has responded to Lasix 40 mg and metolazone 2.5 mg as well as the Amber's lymph massage. She has been told she has both venous insufficiency and lymphedema but she cannot tolerate compression stockings 11/28/16;  the Amber saw Dr. Kellie Simmering recently. Per the Amber he did arterial Dopplers in the office that did not show evidence of arterial insufficiency, per the Amber he stated "treat this like an ordinary venous ulcer". She also saw her dermatologist Dr. Ronnald Ramp who felt that this was more of a vascular ulcer. In general things are improving although she arrives today with increasing bilateral lower extremity edema with weeping a deeper fluid through the wound on the left medial leg compatible with some degree of lymphedema 12/04/16; the Amber's wound is fully epithelialized but I don't think fully healed. We will do another week of depression with Promogran and TCA however I suspect we'll be able to discharge her next week. This is a very unusual-looking wound which was initially a figure-of-eight type wound lying on its side surrounded by petechial like hemorrhage. She has had venous ablation on this side. She apparently does not have an arterial issue per Dr. Kellie Simmering. She saw her dermatologist thought it was "vascular". Amber is definitely going to need ongoing compression and I talked about this with her today she will go to elastic therapy after she leaves here next week 12/11/16; the Amber's wound is not completely closed today. She has surrounding scar tissue and in further discussion with  the Amber it would appear that she had ulcers in this area in 2009 for a prolonged period of time ultimately requiring a punch biopsy of this area that only showed venous insufficiency. I did not previously pickup on this part of the history from the Amber. 12/18/16; the Amber's wound is completely epithelialized. There is no open area here. She has significant bilateral venous insufficiency with secondary lymphedema to a mild-to-moderate degree she does not have compression stockings.. She did not say anything to me when I was in the room, she told our intake nurse that she was still having pain in this  area. This isn't unusual recurrent small open area. She is going to go to elastic therapy to obtain compression stockings. 12/25/16; the Amber's wound is fully epithelialized. There is no open area here. The Amber describes some continued episodic discomfort in this area medial left calf. However everything looks fine and healed here. She is been to elastic therapy and EMMALI, KAROW ([id number]) caught herself 15-20 mmHg stockings, they apparently were having trouble getting 20-30 mm stockings in her size 01/22/17; this is a Amber we discharged from the clinic a month ago. She has a recurrent open wound on her medial left calf. She had 15 mm support stockings. I told her I thought she needed 20-30 mm compression stockings. She tells me that she has been ill with hospitalization secondary to asthma and is been found to have severe hypokalemia likely secondary to a combination of Lasix and metolazone. This morning she noted blistering and leaking fluid on the posterior part of her left leg. She called our intake nurse urgently and we was saw her this afternoon. She has not had any real discomfort here. I don't know that she's been wearing any stockings on this leg for at least 2-3 days. ABIs in this clinic were 1.21 on the right and 1.3 on the left. She is previously seen vascular surgery who does not think that there is a peripheral arterial issue. 01/30/17; Amber arrives with no open wound on the left leg. She has been to elastic therapy and obtained 20-65mmhg below knee stockings and she has one on the right leg today. READMISSION 02/19/18; this Clarida is a now 73 year old Amber we've had in this clinic perhaps 3 times before. I had last looked at her from January 07 December 2016 with an area on the medial left leg. We discharged her on 12/25/16 however she had to be readmitted on 01/22/17 with a recurrence. I have in my notes that we discharged her on 20-30 mm stockings although she  tells me she was only wearing support hose because she cannot get stockings on predominantly related to her cervical spine surgery/issues. She has had previous ablations done by vein and vascular in [place] including a great saphenous vein ablation on the left with an anterior accessory branch ablation I think both of these were in 2016. On one of the previous visit she had a biopsy noted 2009 that was negative. She is not felt to have an arterial issue. She is not a diabetic. She does have a history of obstructive sleep apnea hypertension asthma as well as chronic venous insufficiency and lymphedema. On this occasion she noted 2 dry scaly patch on her left leg. She tried to put lotion on this it didn't really help. There were 2 open areas.the Amber has been seeing her primary physician from 02/05/18 through 02/14/18. She had Unna boots applied. The superior wound now on the lateral  left leg has closed but she's had one wound that remains open on the lateral left leg. This is not the same spot as we dealt with in 2018. ABIs in this clinic were 1.3 bilaterally 02/26/18; Amber has a small wound on the left lateral calf. Dimensions are down. She has chronic venous insufficiency and lymphedema. 03/05/18; small open area on the left lateral calf. Dimensions are down. Tightly adherent necrotic debris over the surface of the wound which was difficult to remove. Also the dressing [over collagen] stuck to the wound surface. This was removed with some difficulty as well. Change the primary dressing to Hydrofera Blue ready 03/12/18; small open area on the left lateral calf. Comes in with tightly adherent surface eschar as well as some adherent Hydrofera Blue. 03/19/18; open area on the left lateral calf. Again adherent surface eschar as well as some adherent Hydrofera Blue nonviable subcutaneous tissue. She complained of pain all week even with the reduction from 4-3 layer compression I put on last week. Also  she had an increase in her ankle and calf measurements probably related to the same thing. 03/26/18; open area on the left lateral calf. A very small open area remains here. We used silver alginate starting last week as the Hydrofera Blue seem to stick to the wound bed. In using 4-layer compression 04/02/18; the open area in the left lateral calf at some adherent slough which I removed there is no open area here. We are able to transition her into her own compression stocking. Truthfully I think this is probably his support hose. However this does not maintain skin integrity will be limited. She cannot put over the toe compression stockings on because of neck problems hand problems etc. She is allergic to the lining layer of juxta lites. We might be forced to use extremitease stocking should this fail READMIT 11/24/2018 Amber is now a 73 year old woman who is not a diabetic. She has been in this clinic on at least 3 previous occasions largely with recurrent wounds on her left leg secondary to chronic venous insufficiency with secondary lymphedema. Her situation is complicated by inability to get stockings on and an allergy to neoprene which is apparently a component and at least juxta lites and other stockings. As a result she really has not been wearing any stockings on her legs. She tells [place] that roughly 2 or 3 weeks ago she started noticing a stinging sensation just above her ankle on the left medial aspect. She has been diagnosed with pseudogout and she wondered whether this was what she was experiencing. She tried to dress this with something she bought at the store however subsequently it pulled skin off and now she has an open wound that is not improving. She has been using Vaseline gauze with a cover bandage. She saw her primary doctor last week who put an Haematologist on her. ABIs in this clinic was 1.03 on the left NEYAH, ELLERMAN. ([id number]) 2/12; the area is on the left medial ankle.  Odd-looking wound with what looks to be surface epithelialization but a multitude of small petechial openings. This clearly not closed yet. We have been using silver alginate under 3 layer compression with TCA 2/19; the wound area did not look quite as good this week. Necrotic debris over the majority of the wound surface which required Amber. She continues to have a multitude of what looked to be small petechial openings. She reminds [place] that she had a biopsy on this initially  during her first outbreak in 2015 in [place] dermatology. She expresses concern about this being a possible melanoma. 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 I assured her that this does not look anything like melanoma. Except for the petechial reaction it does look like a venous insufficiency area and she certainly has evidence of this on both sides 2/26; a difficult area on the left medial ankle. The Amber clearly has chronic venous hypertension with some degree of lymphedema. The odd thing about the area is the small petechial hemorrhages. I am not really sure how to explain this. This was present last time and this is not a compression injury. We have been using Hydrofera Blue which I changed to last week 3/4; still using Hydrofera Blue. Aggressive Amber today. She does not have known arterial issues. She has seen Dr. Kellie Simmering at [facility] vein [facility] vascular and and has an ablation on the left. [Anterior accessory branch of the greater saphenous]. From what I remember they did not feel she had an arterial issue. The Amber has had this area biopsied in 2009 at [facility] dermatology and by her recollection they said this was "stasis". She is also follow-up with dermatology locally who thought that this was more of a vascular issue 3/11; using Hydrofera Blue. Aggressive Amber today. She does not have an arterial  issue. We are using 3 layer compression although we may need to go to 4. The Amber has been in for multiple changes to her wrap since I last saw her a week ago. She says that the area was leaking. I do not have too much more information on what was found Objective Constitutional Amber is hypertensive.. Pulse regular and within target range for Amber.Marland Kitchen Respirations regular, non-labored and within target range.. Temperature is normal and within the target range for the Amber.Marland Kitchen appears in no distress. Vitals Time Taken: 11:00 AM, Height: 63 in, Weight: 224.7 lbs, BMI: 39.8, Temperature: 98.2 F, Pulse: 64 bpm, Respiratory Rate: 18 breaths/min, Blood Pressure: 153/75 mmHg. General Notes: Wound exam; the wound measured slightly smaller today however still not a viable surface. Another aggressive Amber in this area. She has very clearly chronic venous hypertension with dilated small veins on the medial aspect of her feet Integumentary (Hair, Skin) Wound #5 status is Open. Original cause of wound was Gradually Appeared. The wound is located on the Left,Medial Lower Leg. The wound measures 2.9cm length x 3cm width x 0.1cm depth; 6.833cm^2 area and 0.683cm^3 volume. There is Fat Layer (Subcutaneous Tissue) Exposed exposed. There is no tunneling or undermining noted. There is a medium amount of serosanguineous drainage noted. The wound margin is indistinct and nonvisible. There is small (1-33%) red, pink granulation within the wound bed. There is a large (67-100%) amount of necrotic tissue within the wound bed including Adherent Slough. The periwound skin appearance exhibited: Maceration, Hemosiderin Staining. The periwound skin appearance did not exhibit: Callus, Crepitus, Excoriation, Induration, Rash, Scarring, Dry/Scaly, Atrophie Blanche, Cyanosis, Ecchymosis, Mottled, Pallor, Rubor, Erythema. Periwound temperature was noted as No Abnormality. The periwound has tenderness on  palpation. KAEDEN, DEPAZ ([id number]) Assessment Active Problems ICD-10 Non-pressure chronic ulcer of left calf limited to breakdown of skin Chronic venous hypertension (idiopathic) with inflammation of right lower extremity Lymphedema, not elsewhere classified Procedures Wound #5 Pre-procedure diagnosis of Wound #5 is a Lymphedema located on the Left,Medial Lower Leg . There was a Excisional Skin/Subcutaneous Tissue Amber with a  total area of 8.7 sq cm performed by [name] Dillon, MD. With the following instrument(s): Curette to remove Viable and Non-Viable tissue/material. Material removed includes Subcutaneous Tissue, Slough, and Biofilm after achieving pain control using Lidocaine. No specimens were taken. A time out was conducted at 11:44, prior to the start of the procedure. A Moderate amount of bleeding was controlled with Pressure. The procedure was tolerated well. Post Amber Measurements: 2.9cm length x 3cm width x 0.1cm depth; 0.683cm^3 volume. Character of Wound/Ulcer Post Amber is stable. Post procedure Diagnosis Wound #5: Same as Pre-Procedure Plan Wound Cleansing: Wound #5 Left,Medial Lower Leg: Clean wound with Normal Saline. May Shower, gently pat wound dry prior to applying new dressing. Anesthetic (add to Medication List): Wound #5 Left,Medial Lower Leg: Topical Lidocaine 4% cream applied to wound bed prior to Amber (In Clinic Only). Benzocaine Topical Anesthetic Spray applied to wound bed prior to Amber (In Clinic Only). Skin Barriers/Peri-Wound Care: Wound #5 Left,Medial Lower Leg: Triamcinolone Acetonide Ointment (TCA) - on wound and peri-wound Primary Wound Dressing: Wound #5 Left,Medial Lower Leg: Hydrafera Blue Ready Transfer Secondary Dressing: Wound #5 Left,Medial Lower Leg: XtraSorb Dressing Change Frequency: Wound #5 Left,Medial Lower Leg: Change dressing every week Follow-up Appointments: Wound #5 Left,Medial  Lower Leg: Return Appointment in 1 week. Nurse Visit as needed Edema Control: ALLENA, PIETILA ([id number]) Wound #5 Left,Medial Lower Leg: 3 Layer Compression System - Left Lower Extremity 1. Continue TCA on the periwound 2. Hydrofera Blue. If I cannot get to a viable surface then Iodoflex 3. Extra sorb/ABDs 4 3 layer compression. I think we could move to 4 layer compression Electronic Signature(s) Signed: 01/14/2019 5:42:07 PM By: Linton Ham MD Entered By: Linton Ham on 01/14/2019 12:46:43 Cedrone, [name] ([id number]) -------------------------------------------------------------------------------- SuperBill Details Amber Name: MARGAREE, SANDHU. Date of Service: 01/14/2019 Medical Record Number: [id number] Amber Account Number: [id number] Date of Birth/Sex: [birth year] (73 y.o. F) Treating RN: Cornell Barman Primary Care Provider: [name] Other Clinician: Referring Provider: [name] Treating Provider/Extender: Tito Dine in Treatment: 5 Diagnosis Coding ICD-10 Codes Code Description [phone] Non-pressure chronic ulcer of left calf limited to breakdown of skin I87.321 Chronic venous hypertension (idiopathic) with inflammation of right lower extremity I89.0 Lymphedema, not elsewhere classified Facility Procedures CPT4 Code Description: [id number] 11042 - DEB SUBQ TISSUE 20 SQ CM/< ICD-10 Diagnosis Description L97.221 Non-pressure chronic ulcer of left calf limited to breakdown I87.321 Chronic venous hypertension (idiopathic) with inflammation of Modifier: of skin right lower ext Quantity: 1 remity Physician Procedures CPT4 Code Description: [id number] 11042 - WC PHYS SUBQ TISS 20 SQ CM ICD-10 Diagnosis Description L97.221 Non-pressure chronic ulcer of left calf limited to breakdown I87.321 Chronic venous hypertension (idiopathic) with inflammation of Modifier: of skin right lower extr Quantity: 1 emity Electronic Signature(s) Signed: 01/14/2019  5:42:07 PM By: Linton Ham MD Entered By: Linton Ham on 01/14/2019 12:57:36

## 2019-01-19 ENCOUNTER — Telehealth: Payer: Self-pay | Admitting: Family Medicine

## 2019-01-19 ENCOUNTER — Encounter: Payer: Medicare Other | Admitting: Physician Assistant

## 2019-01-19 ENCOUNTER — Other Ambulatory Visit: Payer: Self-pay

## 2019-01-19 DIAGNOSIS — I87312 Chronic venous hypertension (idiopathic) with ulcer of left lower extremity: Secondary | ICD-10-CM | POA: Diagnosis not present

## 2019-01-19 DIAGNOSIS — L97222 Non-pressure chronic ulcer of left calf with fat layer exposed: Secondary | ICD-10-CM | POA: Diagnosis not present

## 2019-01-19 DIAGNOSIS — I872 Venous insufficiency (chronic) (peripheral): Secondary | ICD-10-CM | POA: Diagnosis not present

## 2019-01-19 DIAGNOSIS — I890 Lymphedema, not elsewhere classified: Secondary | ICD-10-CM | POA: Diagnosis not present

## 2019-01-19 DIAGNOSIS — J45909 Unspecified asthma, uncomplicated: Secondary | ICD-10-CM | POA: Diagnosis not present

## 2019-01-19 DIAGNOSIS — I10 Essential (primary) hypertension: Secondary | ICD-10-CM | POA: Diagnosis not present

## 2019-01-19 DIAGNOSIS — L97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d: Secondary | ICD-10-CM | POA: Diagnosis not present

## 2019-01-19 DIAGNOSIS — L97221 Non-pressure chronic ulcer of left calf limited to breakdown of skin: Secondary | ICD-10-CM | POA: Diagnosis not present

## 2019-01-19 DIAGNOSIS — Z86718 Personal history of other venous thrombosis and embolism: Secondary | ICD-10-CM | POA: Diagnosis not present

## 2019-01-19 MED ORDER — POTASSIUM CHLORIDE CRYS ER 20 MEQ PO TBCR: 20.0000 meq | EXTENDED_RELEASE_TABLET | Freq: Every day | ORAL | 0 refills | Status: DC | PRN

## 2019-01-19 MED ORDER — FUROSEMIDE 40 MG PO TABS: 40.0000 mg | ORAL_TABLET | Freq: Every day | ORAL | 0 refills | Status: DC | PRN

## 2019-01-19 NOTE — Telephone Encounter (Signed)
Pt is having swelling in her legs and was told by [place] to increase her lasix. Pt wish to speak to nurse.

## 2019-01-19 NOTE — Telephone Encounter (Signed)
Spoke with pt stating she went to wound ctr today and had some oozing from wound on left leg. States it was suggested she contact PCP about increasing Lasix.  Pt says if it is increased her potassium will probably also need to be increased so new rxs will need to be sent.

## 2019-01-19 NOTE — Telephone Encounter (Signed)
What was today's weight?  Take extra lasix 40mg  twice daily for 3 days then return to once daily dosing. Take extra potassium for total 20 mEq TID for 3 days then decrease back to BID. I've sent in extra of both.

## 2019-01-20 NOTE — Progress Notes (Signed)
CAMI, DELAWDER ([id number]) Visit Report for 01/19/2019 Arrival Information Details Patient Name: [name], [name]. Date of Service: 01/19/2019 11:00 AM Medical Record Number: [id number] Patient Account Number: [id number] Date of Birth/Sex: 01-Feb-1946 (73 y.o. F) Treating RN: Montey Hora Primary Care Rishikesh Khachatryan: [name] Other Clinician: Referring Bijan Ridgley: [name] Treating Kache Mcclurg/Extender: Melburn Hake, HOYT Weeks in Treatment: 5 Visit Information History Since Last Visit Added or deleted any medications: No Patient Arrived: Ambulatory Any new allergies or adverse reactions: No Arrival Time: 11:11 Had a fall or experienced change in No Accompanied By: self activities of daily living that may affect Transfer Assistance: None risk of falls: Patient Identification Verified: Yes Signs or symptoms of abuse/neglect since last visito No Secondary Verification Process Completed: Yes Hospitalized since last visit: No Implantable device outside of the clinic excluding No cellular tissue based products placed in the center since last visit: Has Dressing in Place as Prescribed: Yes Has Compression in Place as Prescribed: Yes Pain Present Now: No Electronic Signature(s) Signed: 01/19/2019 11:29:55 AM By: Montey Hora Entered By: Montey Hora on 01/19/2019 11:29:55 Bumgarner, [name] ([id number]) -------------------------------------------------------------------------------- Clinic Level of Care Assessment Details Patient Name: [name] Date of Service: 01/19/2019 11:00 AM Medical Record Number: [id number] Patient Account Number: [id number] Date of Birth/Sex: Jun 20, 1946 (73 y.o. F) Treating RN: Harold Barban Primary Care Mizani Dilday: [name] Other Clinician: Referring Willim Turnage: [name] Treating Nikolai Wilczak/Extender: Melburn Hake, HOYT Weeks in Treatment: 5 Clinic Level of Care Assessment Items TOOL 4 Quantity Score []  - Use when only an EandM is  performed on FOLLOW-UP visit 0 ASSESSMENTS - Nursing Assessment / Reassessment X - Reassessment of Co-morbidities (includes updates in patient status) 1 10 X- 1 5 Reassessment of Adherence to Treatment Plan ASSESSMENTS - Wound and Skin Assessment / Reassessment []  - Simple Wound Assessment / Reassessment - one wound 0 X- 2 5 Complex Wound Assessment / Reassessment - multiple wounds []  - 0 Dermatologic / Skin Assessment (not related to wound area) ASSESSMENTS - Focused Assessment X - Circumferential Edema Measurements - multi extremities 2 5 []  - 0 Nutritional Assessment / Counseling / Intervention []  - 0 Lower Extremity Assessment (monofilament, tuning fork, pulses) []  - 0 Peripheral Arterial Disease Assessment (using hand held doppler) ASSESSMENTS - Ostomy and/or Continence Assessment and Care []  - Incontinence Assessment and Management 0 []  - 0 Ostomy Care Assessment and Management (repouching, etc.) PROCESS - Coordination of Care X - Simple Patient / Family Education for ongoing care 1 15 []  - 0 Complex (extensive) Patient / Family Education for ongoing care []  - 0 Staff obtains Programmer, systems, Records, Test Results / Process Orders []  - 0 Staff telephones HHA, Nursing Homes / Clarify orders / etc []  - 0 Routine Transfer to another Facility (non-emergent condition) []  - 0 Routine Hospital Admission (non-emergent condition) []  - 0 New Admissions / Biomedical engineer / Ordering NPWT, Apligraf, etc. []  - 0 Emergency Hospital Admission (emergent condition) X- 1 10 Simple Discharge Coordination CELISE, BAZAR ([id number]) []  - 0 Complex (extensive) Discharge Coordination PROCESS - Special Needs []  - Pediatric / Minor Patient Management 0 []  - 0 Isolation Patient Management []  - 0 Hearing / Language / Visual special needs []  - 0 Assessment of Community assistance (transportation, D/C planning, etc.) []  - 0 Additional assistance / Altered mentation []  - 0 Support  Surface(s) Assessment (bed, cushion, seat, etc.) INTERVENTIONS - Wound Cleansing / Measurement []  - Simple Wound Cleansing - one wound 0 X- 2 5 Complex Wound Cleansing - multiple  wounds X- 1 5 Wound Imaging (photographs - any number of wounds) []  - 0 Wound Tracing (instead of photographs) []  - 0 Simple Wound Measurement - one wound X- 2 5 Complex Wound Measurement - multiple wounds INTERVENTIONS - Wound Dressings X - Small Wound Dressing one or multiple wounds 2 10 []  - 0 Medium Wound Dressing one or multiple wounds []  - 0 Large Wound Dressing one or multiple wounds []  - 0 Application of Medications - topical []  - 0 Application of Medications - injection INTERVENTIONS - Miscellaneous []  - External ear exam 0 []  - 0 Specimen Collection (cultures, biopsies, blood, body fluids, etc.) []  - 0 Specimen(s) / Culture(s) sent or taken to Lab for analysis []  - 0 Patient Transfer (multiple staff / Civil Service fast streamer / Similar devices) []  - 0 Simple Staple / Suture removal (25 or less) []  - 0 Complex Staple / Suture removal (26 or more) []  - 0 Hypo / Hyperglycemic Management (close monitor of Blood Glucose) []  - 0 Ankle / Brachial Index (ABI) - do not check if billed separately X- 1 5 Vital Signs Manganelli, Rozlynn J. ([id number]) Has the patient been seen at the hospital within the last three years: Yes Total Score: 110 Level Of Care: New/Established - Level 3 Electronic Signature(s) Signed: 01/19/2019 5:03:00 PM By: Harold Barban Entered By: Harold Barban on 01/19/2019 11:45:27 Anker, [name] ([id number]) -------------------------------------------------------------------------------- Encounter Discharge Information Details Patient Name: [name], [name]. Date of Service: 01/19/2019 11:00 AM Medical Record Number: [id number] Patient Account Number: [id number] Date of Birth/Sex: 04-05-1946 (73 y.o. F) Treating RN: Army Melia Primary Care Sender Rueb: [name] Other  Clinician: Referring Roc Streett: [name] Treating Lashonda Sonneborn/Extender: Melburn Hake, HOYT Weeks in Treatment: 5 Encounter Discharge Information Items Discharge Condition: Stable Ambulatory Status: Ambulatory Discharge Destination: Home Transportation: Private Auto Accompanied By: self Schedule Follow-up Appointment: Yes Clinical Summary of Care: Electronic Signature(s) Signed: 01/19/2019 12:15:37 PM By: Army Melia Entered By: Army Melia on 01/19/2019 12:15:37 Fornwalt, [name] ([id number]) -------------------------------------------------------------------------------- Lower Extremity Assessment Details Patient Name: GEORGETTE, HELMER. Date of Service: 01/19/2019 11:00 AM Medical Record Number: [id number] Patient Account Number: [id number] Date of Birth/Sex: [birth year] (73 y.o. F) Treating RN: Montey Hora Primary Care Jumaane Weatherford: [name] Other Clinician: Referring Blayke Pinera: [name] Treating Audryna Wendt/Extender: Melburn Hake, HOYT Weeks in Treatment: 5 Edema Assessment Assessed: [Left: No] [Right: No] Edema: [Left: Ye] [Right: s] Calf Left: Right: Point of Measurement: 32 cm From Medial Instep 43.4 cm cm Ankle Left: Right: Point of Measurement: 12 cm From Medial Instep 24.5 cm cm Vascular Assessment Pulses: Dorsalis Pedis Palpable: [Left:Yes] Posterior Tibial Extremity colors, hair growth, and conditions: Extremity Color: [Left:Hyperpigmented] Hair Growth on Extremity: [Left:Yes] Temperature of Extremity: [Left:Warm] Capillary Refill: [Left:< 3 seconds] Toe Nail Assessment Left: Right: Thick: Yes Discolored: Yes Deformed: No Improper Length and Hygiene: No Electronic Signature(s) Signed: 01/19/2019 11:30:44 AM By: Montey Hora Entered By: Montey Hora on 01/19/2019 11:30:44 [name], [name] ([id number]) -------------------------------------------------------------------------------- Multi Wound Chart Details Patient Name: DANISHA, BRASSFIELD. Date  of Service: 01/19/2019 11:00 AM Medical Record Number: [id number] Patient Account Number: [id number] Date of Birth/Sex: Jul 21, 1946 (73 y.o. F) Treating RN: Harold Barban Primary Care Adelle Zachar: [name] Other Clinician: Referring Tjuana Vickrey: [name] Treating Tamlyn Sides/Extender: STONE III, HOYT Weeks in Treatment: 5 Vital Signs Height(in): 63 Pulse(bpm): 61 Weight(lbs): 224.7 Blood Pressure(mmHg): 150/74 Body Mass Index(BMI): 40 Temperature(F): 97.9 Respiratory Rate 16 (breaths/min): Photos: [5:No Photos] [6:No Photos] [N/A:N/A] Wound Location: [5:Left Lower Leg - Medial] [6:Left Lower Leg - Lateral] [N/A:N/A] Wounding  Event: [5:Gradually Appeared] [6:Gradually Appeared] [N/A:N/A] Primary Etiology: [5:Lymphedema] [6:Venous Leg Ulcer] [N/A:N/A] Comorbid History: [5:Cataracts, Asthma, Sleep Apnea, Deep Vein Thrombosis, Hypertension, Peripheral Venous Disease, Osteoarthritis, Received Chemotherapy, Received Radiation] [6:Cataracts, Asthma, Sleep Apnea, Deep Vein Thrombosis, Hypertension,  Peripheral Venous Disease, Osteoarthritis, Received Chemotherapy, Received Radiation] [N/A:N/A] Date Acquired: [5:11/19/2018] [6:01/19/2019] [N/A:N/A] Weeks of Treatment: [5:5] [6:0] [N/A:N/A] Wound Status: [5:Open] [6:Open] [N/A:N/A] Measurements L x W x D [5:2.9x3x0.1] [6:0.7x0.4x0.1] [N/A:N/A] (cm) Area (cm) : [5:6.833] [6:0.22] [N/A:N/A] Volume (cm) : [5:0.683] [6:0.022] [N/A:N/A] % Reduction in Area: [5:20.00%] [6:N/A] [N/A:N/A] % Reduction in Volume: [5:20.10%] [6:N/A] [N/A:N/A] Classification: [5:Full Thickness Without Exposed Support Structures] [6:Full Thickness Without Exposed Support Structures] [N/A:N/A] Exudate Amount: [5:Medium] [6:Medium] [N/A:N/A] Exudate Type: [5:Serosanguineous] [6:Serous] [N/A:N/A] Exudate Color: [5:red, brown] [6:amber] [N/A:N/A] Wound Margin: [5:Indistinct, nonvisible] [6:Flat and Intact] [N/A:N/A] Granulation Amount: [5:Medium (34-66%)]  [6:Large (67-100%)] [N/A:N/A] Granulation Quality: [5:Red, Pink] [6:Red] [N/A:N/A] Necrotic Amount: [5:Medium (34-66%)] [6:Small (1-33%)] [N/A:N/A] Exposed Structures: [5:Fat Layer (Subcutaneous Tissue) Exposed: Yes Fascia: No Tendon: No Muscle: No Joint: No Bone: No] [6:Fat Layer (Subcutaneous Tissue) Exposed: Yes Fascia: No Tendon: No Muscle: No Joint: No Bone: No] [N/A:N/A] Epithelialization: [5:Small (1-33%)] [6:None] [N/A:N/A] Periwound Skin Texture: Excoriation: No Excoriation: No N/A Induration: No Induration: No Callus: No Callus: No Crepitus: No Crepitus: No Rash: No Rash: No Scarring: No Scarring: No Periwound Skin Moisture: Maceration: Yes Maceration: No N/A Dry/Scaly: No Dry/Scaly: No Periwound Skin Color: Hemosiderin Staining: Yes Hemosiderin Staining: Yes N/A Atrophie Blanche: No Atrophie Blanche: No Cyanosis: No Cyanosis: No Ecchymosis: No Ecchymosis: No Erythema: No Erythema: No Mottled: No Mottled: No Pallor: No Pallor: No Rubor: No Rubor: No Temperature: No Abnormality No Abnormality N/A Tenderness on Palpation: Yes Yes N/A Wound Preparation: Ulcer Cleansing: Ulcer Cleansing: Other: soap N/A Rinsed/Irrigated with Saline, and water Other: soap and water Topical Anesthetic Applied: Topical Anesthetic Applied: Other: lidocaine 4% None, Other: lidocaine 4% Treatment Notes Electronic Signature(s) Signed: 01/19/2019 5:03:00 PM By: Harold Barban Entered By: Harold Barban on 01/19/2019 11:39:55 Ketchum, [name] ([id number]) -------------------------------------------------------------------------------- Corinne Details Patient Name: ANAAYA, FUSTER. Date of Service: 01/19/2019 11:00 AM Medical Record Number: [id number] Patient Account Number: [id number] Date of Birth/Sex: [birth year] (73 y.o. F) Treating RN: Harold Barban Primary Care Marabella Popiel: [name] Other Clinician: Referring Timesha Cervantez: [name] Treating Frankie Zito/Extender: Melburn Hake, HOYT Weeks in Treatment: 5 Active Inactive Orientation to the Wound Care Program Nursing Diagnoses: Knowledge deficit related to the wound healing center program Goals: Patient/caregiver will verbalize understanding of the [place] [facility] Program Date Initiated: 12/10/2018 Target Resolution Date: 01/09/2019 Goal Status: Active Interventions: Provide education on orientation to the wound center Notes: Soft Tissue Infection Nursing Diagnoses: Impaired tissue integrity Goals: Patient's soft tissue infection will resolve Date Initiated: 12/10/2018 Target Resolution Date: 01/09/2019 Goal Status: Active Interventions: Assess signs and symptoms of infection every visit Notes: Venous Leg Ulcer Nursing Diagnoses: Actual venous Insuffiency (use after diagnosis is confirmed) Goals: Patient will maintain optimal edema control Date Initiated: 12/10/2018 Target Resolution Date: 01/09/2019 Goal Status: Active Interventions: Assess peripheral edema status every visit. LYNIX, BONINE ([id number]) Treatment Activities: Therapeutic compression applied : 12/10/2018 Notes: Wound/Skin Impairment Nursing Diagnoses: Impaired tissue integrity Goals: Patient/caregiver will verbalize understanding of skin care regimen Date Initiated: 12/10/2018 Target Resolution Date: 01/09/2019 Goal Status: Active Interventions: Assess ulceration(s) every visit Treatment Activities: Topical wound management initiated : 12/10/2018 Notes: Electronic Signature(s) Signed: 01/19/2019 5:03:00 PM By: Harold Barban Entered By: Harold Barban on 01/19/2019 11:39:47 Ellenberger, [name] ([id number]) -------------------------------------------------------------------------------- Pain Assessment Details Patient  Name: DIEM, DICOCCO. Date of Service: 01/19/2019 11:00 AM Medical Record Number: [id number] Patient Account Number: [id number] Date of Birth/Sex: Apr 01, 1946 (72 y.o.  F) Treating RN: Montey Hora Primary Care Ryleigh Buenger: [name] Other Clinician: Referring Benjaman Artman: [name] Treating Kate Larock/Extender: Melburn Hake, HOYT Weeks in Treatment: 5 Active Problems Location of Pain Severity and Description of Pain Patient Has Paino Yes Site Locations Pain Location: Pain in Ulcers With Dressing Change: Yes Duration of the Pain. Constant / Intermittento Constant Pain Management and Medication Current Pain Management: Electronic Signature(s) Signed: 01/19/2019 11:30:07 AM By: Montey Hora Entered By: Montey Hora on 01/19/2019 11:30:06 [name] ([id number]) -------------------------------------------------------------------------------- Patient/Caregiver Education Details Patient Name: ALAYSSA, FLINCHUM. Date of Service: 01/19/2019 11:00 AM Medical Record Number: [id number] Patient Account Number: [id number] Date of Birth/Gender: December 10, 1945 (73 y.o. F) Treating RN: Harold Barban Primary Care Physician: [name] Other Clinician: Referring Physician: [name] Treating Physician/Extender: Sharalyn Ink in Treatment: 5 Education Assessment Education Provided To: Patient Education Topics Provided Wound/Skin Impairment: Handouts: Caring for Your Ulcer Methods: Demonstration, Explain/Verbal Responses: State content correctly Electronic Signature(s) Signed: 01/19/2019 5:03:00 PM By: Harold Barban Entered By: Harold Barban on 01/19/2019 11:40:14 Knowlton, [name] ([id number]) -------------------------------------------------------------------------------- Wound Assessment Details Patient Name: DERYN, MASSENGALE. Date of Service: 01/19/2019 11:00 AM Medical Record Number: [id number] Patient Account Number: [id number] Date of Birth/Sex: 1946/01/22 (73 y.o. F) Treating RN: Montey Hora Primary Care Ulla Mckiernan: [name] Other Clinician: Referring Jemina Scahill: [name] Treating  Miryam Mcelhinney/Extender: STONE III, HOYT Weeks in Treatment: 5 Wound Status Wound Number: 5 Primary Lymphedema Etiology: Wound Location: Left Lower Leg - Medial Wound Open Wounding Event: Gradually Appeared Status: Date Acquired: 11/19/2018 Comorbid Cataracts, Asthma, Sleep Apnea, Deep Vein Weeks Of Treatment: 5 History: Thrombosis, Hypertension, Peripheral Venous Clustered Wound: No Disease, Osteoarthritis, Received Chemotherapy, Received Radiation Wound Measurements Length: (cm) 2.9 Width: (cm) 3 Depth: (cm) 0.1 Area: (cm) 6.833 Volume: (cm) 0.683 % Reduction in Area: 20% % Reduction in Volume: 20.1% Epithelialization: Small (1-33%) Tunneling: No Undermining: No Wound Description Full Thickness Without Exposed Support Foul Od Classification: Structures Slough/ Wound Margin: Indistinct, nonvisible Exudate Medium Amount: Exudate Type: Serosanguineous Exudate Color: red, brown or After Cleansing: No Fibrino Yes Wound Bed Granulation Amount: Medium (34-66%) Exposed Structure Granulation Quality: Red, Pink Fascia Exposed: No Necrotic Amount: Medium (34-66%) Fat Layer (Subcutaneous Tissue) Exposed: Yes Necrotic Quality: Adherent Slough Tendon Exposed: No Muscle Exposed: No Joint Exposed: No Bone Exposed: No Periwound Skin Texture Texture Color No Abnormalities Noted: No No Abnormalities Noted: No Callus: No Atrophie Blanche: No Crepitus: No Cyanosis: No Excoriation: No Ecchymosis: No Induration: No Erythema: No Rash: No Hemosiderin Staining: Yes Scarring: No Mottled: No Pallor: No Moisture Rubor: No No Abnormalities Noted: No CATINA, NUSS ([id number]) Dry / Scaly: No Temperature / Pain Maceration: Yes Temperature: No Abnormality Tenderness on Palpation: Yes Wound Preparation Ulcer Cleansing: Rinsed/Irrigated with Saline, Other: soap and water, Topical Anesthetic Applied: None, Other: lidocaine 4%, Treatment Notes Wound #5 (Left, Medial  Lower Leg) Notes Hydrofera blue, xtrasorb, 4 layer compression with unna to anchor Electronic Signature(s) Signed: 01/19/2019 4:22:24 PM By: Montey Hora Entered By: Montey Hora on 01/19/2019 11:12:47 Holroyd, [name] ([id number]) -------------------------------------------------------------------------------- Wound Assessment Details Patient Name: JESIKA, MEN. Date of Service: 01/19/2019 11:00 AM Medical Record Number: [id number] Patient Account Number: [id number] Date of Birth/Sex: 1946/07/24 (73 y.o. F) Treating RN: Montey Hora Primary Care Jahdai Padovano: [name] Other Clinician: Referring Fatina Sprankle: [name] Treating Benn Tarver/Extender: STONE III, HOYT Weeks in Treatment: 5 Wound  Status Wound Number: 6 Primary Venous Leg Ulcer Etiology: Wound Location: Left Lower Leg - Lateral Wound Open Wounding Event: Gradually Appeared Status: Date Acquired: 01/19/2019 Comorbid Cataracts, Asthma, Sleep Apnea, Deep Vein Weeks Of Treatment: 0 History: Thrombosis, Hypertension, Peripheral Venous Clustered Wound: No Disease, Osteoarthritis, Received Chemotherapy, Received Radiation Wound Measurements Length: (cm) 0.7 Width: (cm) 0.4 Depth: (cm) 0.1 Area: (cm) 0.22 Volume: (cm) 0.022 % Reduction in Area: % Reduction in Volume: Epithelialization: None Tunneling: No Undermining: No Wound Description Full Thickness Without Exposed Support Classification: Structures Wound Margin: Flat and Intact Exudate Medium Amount: Exudate Type: Serous Exudate Color: amber Foul Odor After Cleansing: No Slough/Fibrino Yes Wound Bed Granulation Amount: Large (67-100%) Exposed Structure Granulation Quality: Red Fascia Exposed: No Necrotic Amount: Small (1-33%) Fat Layer (Subcutaneous Tissue) Exposed: Yes Necrotic Quality: Adherent Slough Tendon Exposed: No Muscle Exposed: No Joint Exposed: No Bone Exposed: No Periwound Skin Texture Texture Color No Abnormalities  Noted: No No Abnormalities Noted: No Callus: No Atrophie Blanche: No Crepitus: No Cyanosis: No Excoriation: No Ecchymosis: No Induration: No Erythema: No Rash: No Hemosiderin Staining: Yes Scarring: No Mottled: No Pallor: No Moisture Rubor: No No Abnormalities Noted: No EMMRY, HINSCH. ([id number]) Dry / Scaly: No Temperature / Pain Maceration: No Temperature: No Abnormality Tenderness on Palpation: Yes Wound Preparation Ulcer Cleansing: Other: soap and water, Topical Anesthetic Applied: Other: lidocaine 4%, Treatment Notes Wound #6 (Left, Lateral Lower Leg) Notes Hydrofera blue, xtrasorb, 4 layer compression with unna to anchor Electronic Signature(s) Signed: 01/19/2019 4:22:24 PM By: Montey Hora Entered By: Montey Hora on 01/19/2019 11:23:27 [name] ([id number]) -------------------------------------------------------------------------------- Vitals Details Patient Name: AINSLEIGH, KAKOS. Date of Service: 01/19/2019 11:00 AM Medical Record Number: [id number] Patient Account Number: [id number] Date of Birth/Sex: 1946-09-10 (73 y.o. F) Treating RN: Harold Barban Primary Care Heena Woodbury: [name] Other Clinician: Referring Dirck Butch: [name] Treating Davonn Flanery/Extender: STONE III, HOYT Weeks in Treatment: 5 Vital Signs Time Taken: 11:00 Temperature (F): 97.9 Height (in): 63 Pulse (bpm): 61 Weight (lbs): 224.7 Respiratory Rate (breaths/min): 16 Body Mass Index (BMI): 39.8 Blood Pressure (mmHg): 150/74 Reference Range: 80 - 120 mg / dl Electronic Signature(s) Signed: 01/19/2019 5:03:00 PM By: Harold Barban Entered By: Harold Barban on 01/19/2019 11:39:30

## 2019-01-20 NOTE — Telephone Encounter (Signed)
Spoke with pt relaying Dr. Synthia Innocent instructions and message. Pt verbalizes understanding. Says she was not weighed yesterday but last few times, wt was 222- 225 lbs.

## 2019-01-21 ENCOUNTER — Other Ambulatory Visit: Payer: Self-pay

## 2019-01-21 ENCOUNTER — Encounter: Payer: Medicare Other | Admitting: Internal Medicine

## 2019-01-21 DIAGNOSIS — I10 Essential (primary) hypertension: Secondary | ICD-10-CM | POA: Diagnosis not present

## 2019-01-21 DIAGNOSIS — I890 Lymphedema, not elsewhere classified: Secondary | ICD-10-CM | POA: Diagnosis not present

## 2019-01-21 DIAGNOSIS — I87312 Chronic venous hypertension (idiopathic) with ulcer of left lower extremity: Secondary | ICD-10-CM | POA: Diagnosis not present

## 2019-01-21 DIAGNOSIS — J45909 Unspecified asthma, uncomplicated: Secondary | ICD-10-CM | POA: Diagnosis not present

## 2019-01-21 DIAGNOSIS — Z86718 Personal history of other venous thrombosis and embolism: Secondary | ICD-10-CM | POA: Diagnosis not present

## 2019-01-21 DIAGNOSIS — I872 Venous insufficiency (chronic) (peripheral): Secondary | ICD-10-CM | POA: Diagnosis not present

## 2019-01-21 DIAGNOSIS — L97221 Non-pressure chronic ulcer of left calf limited to breakdown of skin: Secondary | ICD-10-CM | POA: Diagnosis not present

## 2019-01-21 DIAGNOSIS — L97322 Non-pressure chronic ulcer of left ankle with fat layer exposed: Secondary | ICD-10-CM | POA: Diagnosis not present

## 2019-01-21 DIAGNOSIS — L97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d: Secondary | ICD-10-CM | POA: Diagnosis not present

## 2019-01-21 NOTE — Progress Notes (Signed)
Amber, [name] ([id number]) Visit Report for 01/19/2019 Chief Complaint Document Details Patient Name: [name], [name]. Date of Service: 01/19/2019 11:00 AM Medical Record Number: [id number] Patient Account Number: [id number] Date of Birth/Sex: 1946/07/26 (72 y.o. F) Treating RN: Harold Barban Primary Care Provider: [name] Other Clinician: Referring Provider: [name] Treating Provider/Extender: Melburn Hake, Adalida Garver Weeks in Treatment: 5 Information Obtained from: Patient Chief Complaint Left calf venous stasis ulceration. 11/13/16; the patient is here for a left calf recurrent ulceration which is painful and has been present for the last month 01/22/17; the patient re-presents here for recurrent difficulties with blistering and leaking fluid on her left posterior calf 12/10/18; the patient returns to clinic for a wound on the left medial calf Electronic Signature(s) Signed: 01/20/2019 2:10:29 PM By: Worthy Keeler PA-C Entered By: Worthy Keeler on 01/19/2019 11:36:37 Fero, [name] ([id number]) -------------------------------------------------------------------------------- HPI Details Patient Name: Amber, [name]. Date of Service: 01/19/2019 11:00 AM Medical Record Number: [id number] Patient Account Number: [id number] Date of Birth/Sex: [birth year] (72 y.o. F) Treating RN: Harold Barban Primary Care Provider: [name] Other Clinician: Referring Provider: [name] Treating Provider/Extender: STONE III, Jessie Schrieber Weeks in Treatment: 5 History of Present Illness HPI Description: Pleasant 73 year old with history of chronic venous insufficiency. No diabetes or peripheral vascular disease. Left ABI 1.29. Questionable history of left lower extremity DVT. She developed a recurrent ulceration on her left lateral calf in December 2015, which she attributes to poor diet and subsequent lower extremity edema. She underwent endovenous laser ablation of her left greater  saphenous vein in 2010. She underwent laser ablation of accessory branch of left GSV in April 2016 by Dr. Kellie Simmering at [facility]. She was previously wearing Unna boots, which she tolerated well. Tolerating 2 layer compression and cadexomer iodine. She returns to clinic for follow-up and is without new complaints. She denies any significant pain at this time. She reports persistent pain with pressure. No claudication or ischemic rest pain. No fever or chills. No drainage. READMISSION 11/13/16; this is a 73 year old woman who is not a diabetic. She is here for a review of a painful area on her left medial lower extremity. I note that she was seen here previously last year for wound I believe to be in the same area. At that time she had undergone previously a left greater saphenous vein ablation by Dr. Kellie Simmering and she had a ablation of the anterior accessory branch of the left greater saphenous vein in March 2016. Seeing that the wound actually closed over. In reviewing the history with her today the ulcer in this area has been recurrent. She describes a biopsy of this area in 2009 that only showed stasis physiology. 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 Do not see that she has ever had arterial studies in the left leg. She tells me she has a follow-up with Dr. Kellie Simmering in roughly 10 days In any case she developed the reopening of this area roughly a month ago. On the background of this she describes rapidly increasing edema which has responded to Lasix 40 mg and metolazone 2.5 mg as well as the patient's lymph massage. She has been told she has both venous insufficiency and lymphedema but she cannot tolerate compression stockings 11/28/16; the patient  saw Dr. Kellie Simmering recently. Per the  patient he did arterial Dopplers in the office that did not show evidence of arterial insufficiency, per the patient he stated "treat this like an ordinary venous ulcer". She also saw her dermatologist Dr. Ronnald Ramp who felt that this was more of a vascular ulcer. In general things are improving although she arrives today with increasing bilateral lower extremity edema with weeping a deeper fluid through the wound on the left medial leg compatible with some degree of lymphedema 12/04/16; the patient's wound is fully epithelialized but I don't think fully healed. We will do another week of depression with Promogran and TCA however I suspect we'll be able to discharge her next week. This is a very unusual-looking wound which was initially a figure-of-eight type wound lying on its side surrounded by petechial like hemorrhage. She has had venous ablation on this side. She apparently does not have an arterial issue per Dr. Kellie Simmering. She saw her dermatologist thought it was "vascular". Patient is definitely going to need ongoing compression and I talked about this with her today she will go to elastic therapy after she leaves here next week 12/11/16; the patient's wound is not completely closed today. She has surrounding scar tissue and in further discussion with the patient it would appear that she had ulcers in this area in 2009 for a prolonged period of time ultimately requiring a punch biopsy of this area that only showed venous insufficiency. I did not previously pickup on this part of the history from the patient. 12/18/16; the patient's wound is completely epithelialized. There is no open area here. She has significant bilateral venous insufficiency with secondary lymphedema to a mild-to-moderate degree she does not have compression stockings.. She did not say anything to me when I was in the room, she told our intake nurse that she was still having pain in this area. This isn't unusual recurrent small open  area. She is going to go to elastic therapy to obtain compression stockings. 12/25/16; the patient's wound is fully epithelialized. There is no open area here. The patient describes some continued episodic discomfort in this area medial left calf. However everything looks fine and healed here. She is been to elastic therapy and caught herself 15-20 mmHg stockings, they apparently were having trouble getting 20-30 mm stockings in her size ASHLLEY, BOOHER ([id number]) 01/22/17; this is a patient we discharged from the clinic a month ago. She has a recurrent open wound on her medial left calf. She had 15 mm support stockings. I told her I thought she needed 20-30 mm compression stockings. She tells me that she has been ill with hospitalization secondary to asthma and is been found to have severe hypokalemia likely secondary to a combination of Lasix and metolazone. This morning she noted blistering and leaking fluid on the posterior part of her left leg. She called our intake nurse urgently and we was saw her this afternoon. She has not had any real discomfort here. I don't know that she's been wearing any stockings on this leg for at least 2-3 days. ABIs in this clinic were 1.21 on the right and 1.3 on the left. She is previously seen vascular surgery who does not think that there is a peripheral arterial issue. 01/30/17; Patient arrives with no open wound on the left leg. She has been to elastic therapy and obtained 20-27mmhg below knee stockings and she has one on the right leg today. READMISSION 02/19/18; this Brazeau is a now 73 year old  patient we've had in this clinic perhaps 3 times before. I had last looked at her from January 07 December 2016 with an area on the medial left leg. We discharged her on 12/25/16 however she had to be readmitted on 01/22/17 with a recurrence. I have in my notes that we discharged her on 20-30 mm stockings although she tells me she was only wearing support hose because  she cannot get stockings on predominantly related to her cervical spine surgery/issues. She has had previous ablations done by vein and vascular in [place] including a great saphenous vein ablation on the left with an anterior accessory branch ablation I think both of these were in 2016. On one of the previous visit she had a biopsy noted 2009 that was negative. She is not felt to have an arterial issue. She is not a diabetic. She does have a history of obstructive sleep apnea hypertension asthma as well as chronic venous insufficiency and lymphedema. On this occasion she noted 2 dry scaly patch on her left leg. She tried to put lotion on this it didn't really help. There were 2 open areas.the patient has been seeing her primary physician from 02/05/18 through 02/14/18. She had Unna boots applied. The superior wound now on the lateral left leg has closed but she's had one wound that remains open on the lateral left leg. This is not the same spot as we dealt with in 2018. ABIs in this clinic were 1.3 bilaterally 02/26/18; patient has a small wound on the left lateral calf. Dimensions are down. She has chronic venous insufficiency and lymphedema. 03/05/18; small open area on the left lateral calf. Dimensions are down. Tightly adherent necrotic debris over the surface of the wound which was difficult to remove. Also the dressing [over collagen] stuck to the wound surface. This was removed with some difficulty as well. Change the primary dressing to Hydrofera Blue ready 03/12/18; small open area on the left lateral calf. Comes in with tightly adherent surface eschar as well as some adherent Hydrofera Blue. 03/19/18; open area on the left lateral calf. Again adherent surface eschar as well as some adherent Hydrofera Blue nonviable subcutaneous tissue. She complained of pain all week even with the reduction from 4-3 layer compression I put on last week. Also she had an increase in her ankle and calf  measurements probably related to the same thing. 03/26/18; open area on the left lateral calf. A very small open area remains here. We used silver alginate starting last week as the Hydrofera Blue seem to stick to the wound bed. In using 4-layer compression 04/02/18; the open area in the left lateral calf at some adherent slough which I removed there is no open area here. We are able to transition her into her own compression stocking. Truthfully I think this is probably his support hose. However this does not maintain skin integrity will be limited. She cannot put over the toe compression stockings on because of neck problems hand problems etc. She is allergic to the lining layer of juxta lites. We might be forced to use extremitease stocking should this fail READMIT 11/24/2018 Patient is now a 73 year old woman who is not a diabetic. She has been in this clinic on at least 3 previous occasions largely with recurrent wounds on her left leg secondary to chronic venous insufficiency with secondary lymphedema. Her situation is complicated by inability to get stockings on and an allergy to neoprene which is apparently a component and at least juxta  lites and other stockings. As a result she really has not been wearing any stockings on her legs. She tells [place] that roughly 2 or 3 weeks ago she started noticing a stinging sensation just above her ankle on the left medial aspect. She has been diagnosed with pseudogout and she wondered whether this was what she was experiencing. She tried to dress this with something she bought at the store however subsequently it pulled skin off and now she has an open wound that is not improving. She has been using Vaseline gauze with a cover bandage. She saw her primary doctor last week who put an Haematologist on her. ABIs in this clinic was 1.03 on the left ROSALI, AUGELLO. ([id number]) 2/12; the area is on the left medial ankle. Odd-looking wound with what looks to be  surface epithelialization but a multitude of small petechial openings. This clearly not closed yet. We have been using silver alginate under 3 layer compression with TCA 2/19; the wound area did not look quite as good this week. Necrotic debris over the majority of the wound surface which required debridement. She continues to have a multitude of what looked to be small petechial openings. She reminds [place] that she had a biopsy on this initially during her first outbreak in 2015 in [place] dermatology. She expresses concern about this being a possible melanoma. 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 I assured her that this does not look anything like melanoma. Except for the petechial reaction it does look like a venous insufficiency area and she certainly has evidence of this on both sides 2/26; a difficult area on the left medial ankle. The patient clearly has chronic venous hypertension with some degree of lymphedema. The odd thing about the area is the small petechial hemorrhages. I am not really sure how to explain this. This was present last time and this is not a compression injury. We have been using Hydrofera Blue which I changed to last week 3/4; still using Hydrofera Blue. Aggressive debridement today. She does not have known arterial issues. She has seen Dr. Kellie Simmering at [facility] vein and vascular and and has an ablation on the left. [Anterior accessory branch of the greater saphenous]. From what I remember they did not feel she had an arterial issue. The patient has had this area biopsied in 2009 at [facility] dermatology and by her recollection they said this was "stasis". She is also follow-up with dermatology locally who thought that this was more of a vascular issue 3/11; using Hydrofera Blue. Aggressive debridement today. She does not have an arterial issue. We are using 3 layer compression  although we may need to go to 4. The patient has been in for multiple changes to her wrap since I last saw her a week ago. She says that the area was leaking. I do not have too much more information on what was found 01/19/19 on evaluation today patient was actually being seen for a nurse visit when unfortunately she had the area on her left lateral lower extremity as well as weeping from the right lower extremity that became apparent. Therefore we did end up actually seeing her for a full visit with myself. She is having some pain at this site as well but fortunately nothing too significant at this point. No fevers, chills, nausea, or vomiting noted at this time. Electronic Signature(s) Signed: 01/20/2019 2:10:29 PM By: Joaquim Lai  III, Lilu Mcglown PA-C Entered By: Worthy Keeler on 01/20/2019 11:32:43 Commons, [name] ([id number]) -------------------------------------------------------------------------------- Physical Exam Details Patient Name: KAITY, PITSTICK. Date of Service: 01/19/2019 11:00 AM Medical Record Number: [id number] Patient Account Number: [id number] Date of Birth/Sex: [birth year] (72 y.o. F) Treating RN: Harold Barban Primary Care Provider: [name] Other Clinician: Referring Provider: [name] Treating Provider/Extender: STONE III, Bryce Cheever Weeks in Treatment: 5 Constitutional [name] and well-hydrated in no acute distress. Respiratory normal breathing without difficulty. clear to auscultation bilaterally. Cardiovascular regular rate and rhythm with normal S1, S2. Psychiatric this patient is able to make decisions and demonstrates good insight into disease process. Alert and Oriented x 3. pleasant and cooperative. Notes Patient's wound bed currently shows signs of fairly good granulation which is excellent news. Fortunately there does not appear to be any evidence of active infection at this time which is also good news. With that being said we can from the  right lower extremity though there are no wounds does have a concern for the possibility of developing said wounds. Electronic Signature(s) Signed: 01/20/2019 2:10:29 PM By: Worthy Keeler PA-C Entered By: Worthy Keeler on 01/20/2019 11:33:06 Fons, [name] ([id number]) -------------------------------------------------------------------------------- Physician Orders Details Patient Name: ANTIA, RAHAL. Date of Service: 01/19/2019 11:00 AM Medical Record Number: [id number] Patient Account Number: [id number] Date of Birth/Sex: 01-24-1946 (72 y.o. F) Treating RN: Harold Barban Primary Care Provider: [name] Other Clinician: Referring Provider: [name] Treating Provider/Extender: Melburn Hake, Brylea Pita Weeks in Treatment: 5 Verbal / Phone Orders: No Diagnosis Coding ICD-10 Coding Code Description L97.221 Non-pressure chronic ulcer of left calf limited to breakdown of skin I87.321 Chronic venous hypertension (idiopathic) with inflammation of right lower extremity I89.0 Lymphedema, not elsewhere classified Wound Cleansing Wound #5 Left,Medial Lower Leg o Clean wound with Normal Saline. o May Shower, gently pat wound dry prior to applying new dressing. Anesthetic (add to Medication List) Wound #5 Left,Medial Lower Leg o Topical Lidocaine 4% cream applied to wound bed prior to debridement (In Clinic Only). o Benzocaine Topical Anesthetic Spray applied to wound bed prior to debridement (In Clinic Only). Skin Barriers/Peri-Wound Care Wound #5 Left,Medial Lower Leg o Triamcinolone Acetonide Ointment (TCA) - on wound and peri-wound Primary Wound Dressing Wound #5 Left,Medial Lower Leg o Hydrafera Blue Ready Transfer Wound #6 Left,Lateral Lower Leg o Hydrafera Blue Ready Transfer Secondary Dressing Wound #5 Left,Medial Lower Leg o XtraSorb Wound #6 Left,Lateral Lower Leg o XtraSorb Dressing Change Frequency Wound #5 Left,Medial Lower Leg o Change  dressing every week Follow-up Appointments Wound #5 Left,Medial Lower Leg o Return Appointment in 1 week. - Return this Wednesday for scheduled appointment VALISSA, LYVERS ([id number]) o Nurse Visit as needed Edema Control Wound #5 Left,Medial Lower Leg o 4 Layer Compression System - Bilateral - Unna Boot to anchor Wound #6 Left,Lateral Lower Leg o 4 Layer Compression System - Bilateral - Unna Boot to anchor Off-Loading Wound #5 Left,Medial Lower Leg o Other: - Elevate legs as needed Wound #6 Left,Lateral Lower Leg o Other: - Elevate legs as needed Electronic Signature(s) Signed: 01/19/2019 5:03:00 PM By: Harold Barban Signed: 01/20/2019 2:10:29 PM By: Worthy Keeler PA-C Entered By: Harold Barban on 01/19/2019 11:48:37 Upshur, [name] ([id number]) -------------------------------------------------------------------------------- Problem List Details Patient Name: ZOEJANE, GAULIN. Date of Service: 01/19/2019 11:00 AM Medical Record Number: [id number] Patient Account Number: [id number] Date of Birth/Sex: February 21, 1946 (72 y.o. F) Treating RN: Harold Barban Primary Care Provider: [name] Other Clinician: Referring Provider: [name] Treating Provider/Extender: Joaquim Lai  III, Abreanna Drawdy Weeks in Treatment: 5 Active Problems ICD-10 Evaluated Encounter Code Description Active Date Today Diagnosis L97.221 Non-pressure chronic ulcer of left calf limited to breakdown of 01/07/2019 No Yes skin I87.321 Chronic venous hypertension (idiopathic) with inflammation of 12/10/2018 No Yes right lower extremity I89.0 Lymphedema, not elsewhere classified 12/10/2018 No Yes Inactive Problems Resolved Problems Electronic Signature(s) Signed: 01/20/2019 2:10:29 PM By: Worthy Keeler PA-C Entered By: Worthy Keeler on 01/19/2019 11:36:31 Murch, [name] ([id number]) -------------------------------------------------------------------------------- Progress Note/History and Physical  Details Patient Name: MCKENA, CHERN. Date of Service: 01/19/2019 11:00 AM Medical Record Number: [id number] Patient Account Number: [id number] Date of Birth/Sex: 02-Nov-1946 (72 y.o. F) Treating RN: Harold Barban Primary Care Provider: [name] Other Clinician: Referring Provider: [name] Treating Provider/Extender: Melburn Hake, Christin Mccreedy Weeks in Treatment: 5 Subjective Chief Complaint Information obtained from Patient Left calf venous stasis ulceration. 11/13/16; the patient is here for a left calf recurrent ulceration which is painful and has been present for the last month 01/22/17; the patient re-presents here for recurrent difficulties with blistering and leaking fluid on her left posterior calf 12/10/18; the patient returns to clinic for a wound on the left medial calf History of Present Illness (HPI) Pleasant 73 year old with history of chronic venous insufficiency. No diabetes or peripheral vascular disease. Left ABI 1.29. Questionable history of left lower extremity DVT. She developed a recurrent ulceration on her left lateral calf in December 2015, which she attributes to poor diet and subsequent lower extremity edema. She underwent endovenous laser ablation of her left greater saphenous vein in 2010. She underwent laser ablation of accessory branch of left GSV in April 2016 by Dr. Kellie Simmering at [facility]. She was previously wearing Unna boots, which she tolerated well. Tolerating 2 layer compression and cadexomer iodine. She returns to clinic for follow-up and is without new complaints. She denies any significant pain at this time. She reports persistent pain with pressure. No claudication or ischemic rest pain. No fever or chills. No drainage. READMISSION 11/13/16; this is a 73 year old woman who is not a diabetic. She is here for a review of a painful area on her left medial lower extremity. I note that she was seen here previously last year for wound I believe to be in the  same area. At that time she had undergone previously a left greater saphenous vein ablation by Dr. Kellie Simmering and she had a ablation of the anterior accessory branch of the left greater saphenous vein in March 2016. Seeing that the wound actually closed over. In reviewing the history with her today the ulcer in this area has been recurrent. She describes a biopsy of this area in 2009 that only showed stasis physiology. 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 Do not see that she has ever had arterial studies in the left leg. She tells me she has a follow-up with Dr. Kellie Simmering in roughly 10 days In any case she developed the reopening of this area roughly a month ago. On the background of this she describes rapidly increasing edema which has responded to Lasix 40 mg and metolazone 2.5 mg as well as the patient's lymph massage. She has been told she has both venous insufficiency and lymphedema but she cannot tolerate compression stockings 11/28/16; the patient  saw Dr. Kellie Simmering recently. Per the patient he did arterial Dopplers in the office that did not show evidence of arterial insufficiency, per the patient he stated "treat this like an ordinary venous ulcer". She also saw her dermatologist Dr. Ronnald Ramp who felt that this was more of a vascular ulcer. In general things are improving although she arrives today with increasing bilateral lower extremity edema with weeping a deeper fluid through the wound on the left medial leg compatible with some degree of lymphedema 12/04/16; the patient's wound is fully epithelialized but I don't think fully healed. We will do another week of depression with Promogran and TCA however I suspect we'll be able to discharge her next week. This is a very  unusual-looking wound which was initially a figure-of-eight type wound lying on its side surrounded by petechial like hemorrhage. She has had venous ablation on this side. She apparently does not have an arterial issue per Dr. Kellie Simmering. She saw her dermatologist thought it was "vascular". Patient is definitely going to need ongoing compression and I talked about this with her today she will go to elastic therapy after she leaves here next week 12/11/16; the patient's wound is not completely closed today. She has surrounding scar tissue and in further discussion with the patient it would appear that she had ulcers in this area in 2009 for a prolonged period of time ultimately requiring a punch biopsy of this area that only showed venous insufficiency. I did not previously pickup on this part of the history from the [place]. ([id number]) patient. 12/18/16; the patient's wound is completely epithelialized. There is no open area here. She has significant bilateral venous insufficiency with secondary lymphedema to a mild-to-moderate degree she does not have compression stockings.. She did not say anything to me when I was in the room, she told our intake nurse that she was still having pain in this area. This isn't unusual recurrent small open area. She is going to go to elastic therapy to obtain compression stockings. 12/25/16; the patient's wound is fully epithelialized. There is no open area here. The patient describes some continued episodic discomfort in this area medial left calf. However everything looks fine and healed here. She is been to elastic therapy and caught herself 15-20 mmHg stockings, they apparently were having trouble getting 20-30 mm stockings in her size 01/22/17; this is a patient we discharged from the clinic a month ago. She has a recurrent open wound on her medial left calf. She had 15 mm support stockings. I told her I thought she needed 20-30 mm compression stockings. She  tells me that she has been ill with hospitalization secondary to asthma and is been found to have severe hypokalemia likely secondary to a combination of Lasix and metolazone. This morning she noted blistering and leaking fluid on the posterior part of her left leg. She called our intake nurse urgently and we was saw her this afternoon. She has not had any real discomfort here. I don't know that she's been wearing any stockings on this leg for at least 2-3 days. ABIs in this clinic were 1.21 on the right and 1.3 on the left. She is previously seen vascular surgery who does not think that there is a peripheral arterial issue. 01/30/17; Patient arrives with no open wound on the left leg. She has been to elastic therapy and obtained 20-4mmhg below knee stockings and she has one on the right leg today. READMISSION 02/19/18; this Kruger is a now 73 year old  patient we've had in this clinic perhaps 3 times before. I had last looked at her from January 07 December 2016 with an area on the medial left leg. We discharged her on 12/25/16 however she had to be readmitted on 01/22/17 with a recurrence. I have in my notes that we discharged her on 20-30 mm stockings although she tells me she was only wearing support hose because she cannot get stockings on predominantly related to her cervical spine surgery/issues. She has had previous ablations done by vein and vascular in [place] including a great saphenous vein ablation on the left with an anterior accessory branch ablation I think both of these were in 2016. On one of the previous visit she had a biopsy noted 2009 that was negative. She is not felt to have an arterial issue. She is not a diabetic. She does have a history of obstructive sleep apnea hypertension asthma as well as chronic venous insufficiency and lymphedema. On this occasion she noted 2 dry scaly patch on her left leg. She tried to put lotion on this it didn't really help. There were 2 open  areas.the patient has been seeing her primary physician from 02/05/18 through 02/14/18. She had Unna boots applied. The superior wound now on the lateral left leg has closed but she's had one wound that remains open on the lateral left leg. This is not the same spot as we dealt with in 2018. ABIs in this clinic were 1.3 bilaterally 02/26/18; patient has a small wound on the left lateral calf. Dimensions are down. She has chronic venous insufficiency and lymphedema. 03/05/18; small open area on the left lateral calf. Dimensions are down. Tightly adherent necrotic debris over the surface of the wound which was difficult to remove. Also the dressing [over collagen] stuck to the wound surface. This was removed with some difficulty as well. Change the primary dressing to Hydrofera Blue ready 03/12/18; small open area on the left lateral calf. Comes in with tightly adherent surface eschar as well as some adherent Hydrofera Blue. 03/19/18; open area on the left lateral calf. Again adherent surface eschar as well as some adherent Hydrofera Blue nonviable subcutaneous tissue. She complained of pain all week even with the reduction from 4-3 layer compression I put on last week. Also she had an increase in her ankle and calf measurements probably related to the same thing. 03/26/18; open area on the left lateral calf. A very small open area remains here. We used silver alginate starting last week as the Hydrofera Blue seem to stick to the wound bed. In using 4-layer compression 04/02/18; the open area in the left lateral calf at some adherent slough which I removed there is no open area here. We are able to transition her into her own compression stocking. Truthfully I think this is probably his support hose. However this does not maintain skin integrity will be limited. She cannot put over the toe compression stockings on because of neck problems hand problems etc. She is allergic to the lining layer of juxta lites.  We might be forced to use extremitease stocking should this fail READMIT 11/24/2018 Patient is now a 73 year old woman who is not a diabetic. She has been in this clinic on at least 3 previous occasions largely with recurrent wounds on her left leg secondary to chronic venous insufficiency with secondary lymphedema. Her situation is complicated by inability to get stockings on and an allergy to neoprene which is apparently a component and at least juxta  lites and other stockings. As a result she really has not been wearing any stockings on her legs. KIMORAH, RIDOLFI ([id number]) She tells [place] that roughly 2 or 3 weeks ago she started noticing a stinging sensation just above her ankle on the left medial aspect. She has been diagnosed with pseudogout and she wondered whether this was what she was experiencing. She tried to dress this with something she bought at the store however subsequently it pulled skin off and now she has an open wound that is not improving. She has been using Vaseline gauze with a cover bandage. She saw her primary doctor last week who put an Haematologist on her. ABIs in this clinic was 1.03 on the left 2/12; the area is on the left medial ankle. Odd-looking wound with what looks to be surface epithelialization but a multitude of small petechial openings. This clearly not closed yet. We have been using silver alginate under 3 layer compression with TCA 2/19; the wound area did not look quite as good this week. Necrotic debris over the majority of the wound surface which required debridement. She continues to have a multitude of what looked to be small petechial openings. She reminds [place] that she had a biopsy on this initially during her first outbreak in 2015 in [place] dermatology. She expresses concern about this being a possible melanoma. 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 I  assured her that this does not look anything like melanoma. Except for the petechial reaction it does look like a venous insufficiency area and she certainly has evidence of this on both sides 2/26; a difficult area on the left medial ankle. The patient clearly has chronic venous hypertension with some degree of lymphedema. The odd thing about the area is the small petechial hemorrhages. I am not really sure how to explain this. This was present last time and this is not a compression injury. We have been using Hydrofera Blue which I changed to last week 3/4; still using Hydrofera Blue. Aggressive debridement today. She does not have known arterial issues. She has seen Dr. Kellie Simmering at [facility] and and has an ablation on the left. [Anterior accessory branch of the greater saphenous]. From what I remember they did not feel she had an arterial issue. The patient has had this area biopsied in 2009 at [facility] dermatology and by her recollection they said this was "stasis". She is also follow-up with dermatology locally who thought that this was more of a vascular issue 3/11; using Hydrofera Blue. Aggressive debridement today. She does not have an arterial issue. We are using 3 layer compression although we may need to go to 4. The patient has been in for multiple changes to her wrap since I last saw her a week ago. She says that the area was leaking. I do not have too much more information on what was found 01/19/19 on evaluation today patient was actually being seen for a nurse visit when unfortunately she had the area on her left lateral lower extremity as well as weeping from the right lower extremity that became apparent. Therefore we did end up actually seeing her for a full visit with myself. She is having some pain at this site as well but fortunately nothing too significant at this point. No fevers, chills, nausea, or vomiting noted at this time. Wound History Patient presents  with 1 open wound  that has been present for approximately 3 weeks. Patient has been treating wound in the following manner: unna boot. Laboratory tests have been performed in the last month. Patient reportedly has not tested positive for an antibiotic resistant organism. Patient reportedly has not tested positive for osteomyelitis. Patient reportedly has not had testing performed to evaluate circulation in the legs. Patient experiences the following problems associated with their wounds: swelling. Patient History Information obtained from Patient. Family History Cancer - Mother,Paternal Grandparents, Hypertension - Mother,Father, Kidney Disease - Paternal Grandparents, Lung Disease - Paternal Grandparents, No family history of Diabetes, Heart Disease, Hereditary Spherocytosis, Seizures, Stroke, Thyroid Problems, Tuberculosis. Social History Former smoker - quit at age 77, Marital Status - Divorced, Alcohol Use - Never, Drug Use - No History, Caffeine Use - Daily. Medical History Eyes Patient has history of Cataracts Denies history of Glaucoma, Optic Neuritis Ear/Nose/Mouth/Throat Denies history of Chronic sinus problems/congestion, Middle ear problems Hematologic/Lymphatic Denies history of Anemia, Hemophilia, Human Immunodeficiency Virus, Lymphedema, Sickle Cell Disease AVIYANNA, COLBAUGH ([id number]) Respiratory Patient has history of Asthma, Sleep Apnea Denies history of Aspiration, Chronic Obstructive Pulmonary Disease (COPD), Pneumothorax, Tuberculosis Cardiovascular Patient has history of Deep Vein Thrombosis - LLE 2010, Hypertension, Peripheral Venous Disease Denies history of Angina, Arrhythmia, Congestive Heart Failure, Coronary Artery Disease, Hypotension, Myocardial Infarction, Peripheral Arterial Disease, Phlebitis, Vasculitis Gastrointestinal Denies history of Cirrhosis , Colitis, Crohn s, Hepatitis A, Hepatitis B, Hepatitis C Endocrine Denies history of Type I Diabetes,  Type II Diabetes Genitourinary Denies history of End Stage Renal Disease Immunological Denies history of Lupus Erythematosus, Raynaud s, Scleroderma Integumentary (Skin) Denies history of History of Burn, History of pressure wounds Musculoskeletal Patient has history of Osteoarthritis - neck Denies history of Gout, Rheumatoid Arthritis, Osteomyelitis Neurologic Denies history of Dementia, Neuropathy, Quadriplegia, Paraplegia, Seizure Disorder Oncologic Patient has history of Received Chemotherapy - interfeon immunotherapy, Received Radiation Psychiatric Denies history of Anorexia/bulimia, Confinement Anxiety Hospitalization/Surgery History - 08/05/2010, [facility], Melanoma sx. Medical And Surgical History Notes Constitutional Symptoms (General Health) Vein ablation LLE 2010 Vascular Sx ( vein ablationo) LLE 02/2015 Eyes Horners syndrome Genitourinary Kidney stones Neurologic ct scan showed swollen lymph nodes Oncologic Melanoma (R) shoulder 07/2010; (R) axillary lymphnode removal Review of Systems (ROS) Constitutional Symptoms (General Health) Denies complaints or symptoms of Fever, Chills. Respiratory The patient has no complaints or symptoms. Cardiovascular The patient has no complaints or symptoms. Psychiatric The patient has no complaints or symptoms. MAURICA, OMURA ([id number]) Objective Constitutional [name] and well-hydrated in no acute distress. Vitals Time Taken: 11:00 AM, Height: 63 in, Weight: 224.7 lbs, BMI: 39.8, Temperature: 97.9 F, Pulse: 61 bpm, Respiratory Rate: 16 breaths/min, Blood Pressure: 150/74 mmHg. Respiratory normal breathing without difficulty. clear to auscultation bilaterally. Cardiovascular regular rate and rhythm with normal S1, S2. Psychiatric this patient is able to make decisions and demonstrates good insight into disease process. Alert and Oriented x 3. pleasant and cooperative. General Notes: Patient's wound bed currently shows  signs of fairly good granulation which is excellent news. Fortunately there does not appear to be any evidence of active infection at this time which is also good news. With that being said we can from the right lower extremity though there are no wounds does have a concern for the possibility of developing said wounds. Integumentary (Hair, Skin) Wound #5 status is Open. Original cause of wound was Gradually Appeared. The wound is located on the Left,Medial Lower Leg. The wound measures 2.9cm length x 3cm width x 0.1cm depth; 6.833cm^2 area and 0.683cm^3  volume. There is Fat Layer (Subcutaneous Tissue) Exposed exposed. There is no tunneling or undermining noted. There is a medium amount of serosanguineous drainage noted. The wound margin is indistinct and nonvisible. There is medium (34-66%) red, pink granulation within the wound bed. There is a medium (34-66%) amount of necrotic tissue within the wound bed including Adherent Slough. The periwound skin appearance exhibited: Maceration, Hemosiderin Staining. The periwound skin appearance did not exhibit: Callus, Crepitus, Excoriation, Induration, Rash, Scarring, Dry/Scaly, Atrophie Blanche, Cyanosis, Ecchymosis, Mottled, Pallor, Rubor, Erythema. Periwound temperature was noted as No Abnormality. The periwound has tenderness on palpation. Wound #6 status is Open. Original cause of wound was Gradually Appeared. The wound is located on the Left,Lateral Lower Leg. The wound measures 0.7cm length x 0.4cm width x 0.1cm depth; 0.22cm^2 area and 0.022cm^3 volume. There is Fat Layer (Subcutaneous Tissue) Exposed exposed. There is no tunneling or undermining noted. There is a medium amount of serous drainage noted. The wound margin is flat and intact. There is large (67-100%) red granulation within the wound bed. There is a small (1-33%) amount of necrotic tissue within the wound bed including Adherent Slough. The periwound skin appearance exhibited:  Hemosiderin Staining. The periwound skin appearance did not exhibit: Callus, Crepitus, Excoriation, Induration, Rash, Scarring, Dry/Scaly, Maceration, Atrophie Blanche, Cyanosis, Ecchymosis, Mottled, Pallor, Rubor, Erythema. Periwound temperature was noted as No Abnormality. The periwound has tenderness on palpation. Assessment Active Problems ICD-10 Non-pressure chronic ulcer of left calf limited to breakdown of skin Chronic venous hypertension (idiopathic) with inflammation of right lower extremity Lymphedema, not elsewhere classified DAYLYN, CHRISTINE. ([id number]) Plan Wound Cleansing: Wound #5 Left,Medial Lower Leg: Clean wound with Normal Saline. May Shower, gently pat wound dry prior to applying new dressing. Anesthetic (add to Medication List): Wound #5 Left,Medial Lower Leg: Topical Lidocaine 4% cream applied to wound bed prior to debridement (In Clinic Only). Benzocaine Topical Anesthetic Spray applied to wound bed prior to debridement (In Clinic Only). Skin Barriers/Peri-Wound Care: Wound #5 Left,Medial Lower Leg: Triamcinolone Acetonide Ointment (TCA) - on wound and peri-wound Primary Wound Dressing: Wound #5 Left,Medial Lower Leg: Hydrafera Blue Ready Transfer Wound #6 Left,Lateral Lower Leg: Hydrafera Blue Ready Transfer Secondary Dressing: Wound #5 Left,Medial Lower Leg: XtraSorb Wound #6 Left,Lateral Lower Leg: XtraSorb Dressing Change Frequency: Wound #5 Left,Medial Lower Leg: Change dressing every week Follow-up Appointments: Wound #5 Left,Medial Lower Leg: Return Appointment in 1 week. - Return this Wednesday for scheduled appointment Nurse Visit as needed Edema Control: Wound #5 Left,Medial Lower Leg: 4 Layer Compression System - Bilateral - Unna Boot to anchor Wound #6 Left,Lateral Lower Leg: 4 Layer Compression System - Bilateral - Unna Boot to anchor Off-Loading: Wound #5 Left,Medial Lower Leg: Other: - Elevate legs as needed Wound #6 Left,Lateral  Lower Leg: Other: - Elevate legs as needed My suggestion currently is gonna be that we initiate the above wound care measures for the next week patients agreement the plan. We will subsequently see the extent follow-up. Anything changes or worsens meantime patient will contact the office and let us know otherwise will be seeing her on Wednesday and hopefully that point should be doing much better. Anything changes or worsens the meantime shall contact the office and let us know. Please see above for specific wound care orders. We will see patient for re-evaluation in 1 week(s) here in the clinic. If anything worsens or changes patient will contact our office for additional recommendations. Electronic Signature(s) Signed: 01/20/2019 2:10:29 PM By: Simon Rhein, Sherryll  JMarland Kitchen ([id number]) Entered By: Worthy Keeler on 01/20/2019 11:33:30 Bartosiewicz, [name] ([id number]) -------------------------------------------------------------------------------- ROS/PFSH Details Patient Name: DEMETRI, KERMAN. Date of Service: 01/19/2019 11:00 AM Medical Record Number: [id number] Patient Account Number: [id number] Date of Birth/Sex: 1946-03-14 (72 y.o. F) Treating RN: Harold Barban Primary Care Provider: [name] Other Clinician: Referring Provider: [name] Treating Provider/Extender: Melburn Hake, Cyprian Gongaware Weeks in Treatment: 5 Label Progress Note Print Version as History and Physical for this encounter Information Obtained From Patient Wound History Do you currently have one or more open woundso Yes How many open wounds do you currently haveo 1 Approximately how long have you had your woundso 3 weeks How have you been treating your wound(s) until nowo unna boot Has your wound(s) ever healed and then re-openedo No Have you had any lab work done in the past montho Yes Who ordered the lab work doneo PCP Have you tested positive for an antibiotic resistant organism (MRSA, VRE)o  No Have you tested positive for osteomyelitis (bone infection)o No Have you had any tests for circulation on your legso No Have you had other problems associated with your woundso Swelling Constitutional Symptoms (General Health) Complaints and Symptoms: Negative for: Fever; Chills Medical History: Past Medical History Notes: Vein ablation LLE 2010 Vascular Sx ( vein ablationo) LLE 02/2015 Eyes Medical History: Positive for: Cataracts Negative for: Glaucoma; Optic Neuritis Past Medical History Notes: Horners syndrome Ear/Nose/Mouth/Throat Medical History: Negative for: Chronic sinus problems/congestion; Middle ear problems Hematologic/Lymphatic Medical History: Negative for: Anemia; Hemophilia; Human Immunodeficiency Virus; Lymphedema; Sickle Cell Disease Respiratory Complaints and Symptoms: No Complaints or Symptoms ILITHYIA, TITZER. ([id number]) Medical History: Positive for: Asthma; Sleep Apnea Negative for: Aspiration; Chronic Obstructive Pulmonary Disease (COPD); Pneumothorax; Tuberculosis Cardiovascular Complaints and Symptoms: No Complaints or Symptoms Medical History: Positive for: Deep Vein Thrombosis - LLE 2010; Hypertension; Peripheral Venous Disease Negative for: Angina; Arrhythmia; Congestive Heart Failure; Coronary Artery Disease; Hypotension; Myocardial Infarction; Peripheral Arterial Disease; Phlebitis; Vasculitis Gastrointestinal Medical History: Negative for: Cirrhosis ; Colitis; Crohnos; Hepatitis A; Hepatitis B; Hepatitis C Endocrine Medical History: Negative for: Type I Diabetes; Type II Diabetes Genitourinary Medical History: Negative for: End Stage Renal Disease Past Medical History Notes: Kidney stones Immunological Medical History: Negative for: Lupus Erythematosus; Raynaudos; Scleroderma Integumentary (Skin) Medical History: Negative for: History of Burn; History of pressure wounds Musculoskeletal Medical History: Positive for:  Osteoarthritis - neck Negative for: Gout; Rheumatoid Arthritis; Osteomyelitis Neurologic Medical History: Negative for: Dementia; Neuropathy; Quadriplegia; Paraplegia; Seizure Disorder Past Medical History Notes: ct scan showed swollen lymph nodes Oncologic Medical History: Positive for: Received Chemotherapy - interfeon immunotherapy; Received Radiation Past Medical History Notes: ALEXANDRYA, CHIM ([id number]) Melanoma (R) shoulder 07/2010; (R) axillary lymphnode removal Psychiatric Complaints and Symptoms: No Complaints or Symptoms Medical History: Negative for: Anorexia/bulimia; Confinement Anxiety HBO Extended History Items Eyes: Cataracts Immunizations Pneumococcal Vaccine: Received Pneumococcal Vaccination: Yes Immunization Notes: tetanus shot w/in the last 5 years per pt Implantable Devices No devices added Hospitalization / Surgery History Name of Hospital Purpose of Hospitalization/Surgery Date [facility] Melanoma sx 08/05/2010 Family and Social History Cancer: Yes - Mother,Paternal Grandparents; Diabetes: No; Heart Disease: No; Hereditary Spherocytosis: No; Hypertension: Yes - Mother,Father; Kidney Disease: Yes - Paternal Grandparents; Lung Disease: Yes - Paternal Grandparents; Seizures: No; Stroke: No; Thyroid Problems: No; Tuberculosis: No; Former smoker - quit at age 19; Marital Status - Divorced; Alcohol Use: Never; Drug Use: No History; Caffeine Use: Daily; Financial Concerns: No; Food, Clothing or Shelter Needs: No; Support System Lacking: No; Transportation Concerns: No;  Advanced Directives: No; Patient does not want information on Advanced Directives; Do not resuscitate: No; Living Will: No; Medical Power of Attorney: No Physician Affirmation I have reviewed and agree with the above information. Electronic Signature(s) Signed: 01/20/2019 2:10:29 PM By: Worthy Keeler PA-C Signed: 01/20/2019 4:48:10 PM By: Harold Barban Entered By: Worthy Keeler on 01/20/2019  11:32:55 Perkinson, [name] ([id number]) -------------------------------------------------------------------------------- SuperBill Details Patient Name: BRYNLEIGH, SEQUEIRA. Date of Service: 01/19/2019 Medical Record Number: [id number] Patient Account Number: [id number] Date of Birth/Sex: [birth year] (73 y.o. F) Treating RN: Harold Barban Primary Care Provider: [name] Other Clinician: Referring Provider: [name] Treating Provider/Extender: Melburn Hake, Yocheved Depner Weeks in Treatment: 5 Diagnosis Coding ICD-10 Codes Code Description L97.221 Non-pressure chronic ulcer of left calf limited to breakdown of skin I87.321 Chronic venous hypertension (idiopathic) with inflammation of right lower extremity I89.0 Lymphedema, not elsewhere classified Facility Procedures CPT4 Code: 49201007 Description: 99213 - WOUND CARE VISIT-LEV 3 EST PT Modifier: Quantity: 1 Physician Procedures CPT4 Code Description: [id number] 99214 - WC PHYS LEVEL 4 - EST PT ICD-10 Diagnosis Description L97.221 Non-pressure chronic ulcer of left calf limited to breakdown I87.321 Chronic venous hypertension (idiopathic) with inflammation of I89.0 Lymphedema, not  elsewhere classified Modifier: of skin right lower extr Quantity: 1 emity Electronic Signature(s) Signed: 01/20/2019 2:10:29 PM By: Worthy Keeler PA-C Entered By: Worthy Keeler on 01/20/2019 11:33:53

## 2019-01-22 NOTE — Progress Notes (Signed)
[name], [name] ([id number]) Visit Report for 01/21/2019 Debridement Details Patient Name: [name], [name]. Date of Service: 01/21/2019 2:00 PM Medical Record Number: [id number] Patient Account Number: [id number] Date of Birth/Sex: 1946/08/31 (73 y.o. F) Treating RN: Cornell Barman Primary Care Provider: [name] Other Clinician: Referring Provider: [name] Treating Provider/Extender: Beverly Gust in Treatment: 6 Debridement Performed for Wound #5 Left,Medial Lower Leg Assessment: Performed By: Physician Tobi Bastos, MD Debridement Type: Debridement Level of Consciousness (Pre- Awake and Alert procedure): Pre-procedure Verification/Time Yes - 15:02 Out Taken: Start Time: 15:02 Pain Control: Lidocaine Total Area Debrided (L x W): 3 (cm) x 2.5 (cm) = 7.5 (cm) Tissue and other material Viable, Non-Viable, Eschar, Slough, Subcutaneous, Biofilm, Slough debrided: Level: Skin/Subcutaneous Tissue Debridement Description: Excisional Instrument: Curette Bleeding: Minimum Hemostasis Achieved: Pressure End Time: 15:05 Response to Treatment: Procedure was tolerated well Level of Consciousness Awake and Alert (Post-procedure): Post Debridement Measurements of Total Wound Length: (cm) 3 Width: (cm) 2.5 Depth: (cm) 0.2 Volume: (cm) 1.178 Character of Wound/Ulcer Post Debridement: Requires Further Debridement Post Procedure Diagnosis Same as Pre-procedure Electronic Signature(s) Signed: 01/21/2019 4:41:49 PM By: Tobi Bastos Signed: 01/21/2019 5:54:38 PM By: [name], [name], RN, CWS, [name], [name] Entered By: [name], [name], RN, CWS, Kim on 01/21/2019 15:03:59 [name] ([id number]) -------------------------------------------------------------------------------- Debridement Details Patient Name: [name], [name]. Date of Service: 01/21/2019 2:00 PM Medical Record Number: [id number] Patient Account Number: [id number] Date of Birth/Sex: [birth year] (73 y.o.  F) Treating RN: Cornell Barman Primary Care Provider: [name] Other Clinician: Referring Provider: [name] Treating Provider/Extender: Beverly Gust in Treatment: 6 Debridement Performed for Wound #6 Left,Lateral Lower Leg Assessment: Performed By: Physician Tobi Bastos, MD Debridement Type: Debridement Severity of Tissue Pre Fat layer exposed Debridement: Level of Consciousness (Pre- Awake and Alert procedure): Pre-procedure Verification/Time Yes - 15:02 Out Taken: Start Time: 15:02 Pain Control: Lidocaine Total Area Debrided (L x W): 1 (cm) x 1.2 (cm) = 1.2 (cm) Tissue and other material Viable, Non-Viable, Slough, Subcutaneous, Slough debrided: Level: Skin/Subcutaneous Tissue Debridement Description: Excisional Instrument: Curette Bleeding: Minimum Hemostasis Achieved: Pressure End Time: 15:05 Response to Treatment: Procedure was tolerated well Level of Consciousness Awake and Alert (Post-procedure): Post Debridement Measurements of Total Wound Length: (cm) 1 Width: (cm) 1.2 Depth: (cm) 0.2 Volume: (cm) 0.188 Character of Wound/Ulcer Post Debridement: Requires Further Debridement Severity of Tissue Post Debridement: Fat layer exposed Post Procedure Diagnosis Same as Pre-procedure Electronic Signature(s) Signed: 01/21/2019 4:41:49 PM By: Tobi Bastos Signed: 01/21/2019 5:54:38 PM By: [name], [name], RN, CWS, [name], [name] Entered By: [name], [name], RN, CWS, Kim on 01/21/2019 15:08:04 [name] ([id number]) -------------------------------------------------------------------------------- HPI Details Patient Name: [name], [name]. Date of Service: 01/21/2019 2:00 PM Medical Record Number: [id number] Patient Account Number: [id number] Date of Birth/Sex: 03-Nov-1946 (73 y.o. F) Treating RN: Cornell Barman Primary Care Provider: [name] Other Clinician: Referring Provider: [name] Treating Provider/Extender: Beverly Gust in Treatment: 6 History of Present Illness HPI Description: Pleasant 74 year old with history of chronic venous insufficiency. No diabetes or peripheral vascular disease. Left ABI 1.29. Questionable history of left lower extremity DVT. She developed a recurrent ulceration on her left lateral calf in December 2015, which she attributes to poor diet and subsequent lower extremity edema. She underwent endovenous laser ablation of her left greater saphenous vein in 2010. She underwent laser ablation of accessory branch of left GSV in April 2016 by Dr. Kellie Simmering at [facility]. She was previously wearing Unna boots, which she tolerated well. Tolerating  2 layer compression and cadexomer iodine. She returns to clinic for follow-up and is without new complaints. She denies any significant pain at this time. She reports persistent pain with pressure. No claudication or ischemic rest pain. No fever or chills. No drainage. READMISSION 11/13/16; this is a 73 year old woman who is not a diabetic. She is here for a review of a painful area on her left medial lower extremity. I note that she was seen here previously last year for wound I believe to be in the same area. At that time she had undergone previously a left greater saphenous vein ablation by Dr. Kellie Simmering and she had a ablation of the anterior accessory branch of the left greater saphenous vein in March 2016. Seeing that the wound actually closed over. In reviewing the history with her today the ulcer in this area has been recurrent. She describes a biopsy of this area in 2009 that only showed stasis physiology. 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 Do not see that she has ever had arterial studies in the left leg. She tells  me she has a follow-up with Dr. Kellie Simmering in roughly 10 days In any case she developed the reopening of this area roughly a month ago. On the background of this she describes rapidly increasing edema which has responded to Lasix 40 mg and metolazone 2.5 mg as well as the patient's lymph massage. She has been told she has both venous insufficiency and lymphedema but she cannot tolerate compression stockings 11/28/16; the patient saw Dr. Kellie Simmering recently. Per the patient he did arterial Dopplers in the office that did not show evidence of arterial insufficiency, per the patient he stated "treat this like an ordinary venous ulcer". She also saw her dermatologist Dr. Ronnald Ramp who felt that this was more of a vascular ulcer. In general things are improving although she arrives today with increasing bilateral lower extremity edema with weeping a deeper fluid through the wound on the left medial leg compatible with some degree of lymphedema 12/04/16; the patient's wound is fully epithelialized but I don't think fully healed. We will do another week of depression with Promogran and TCA however I suspect we'll be able to discharge her next week. This is a very unusual-looking wound which was initially a figure-of-eight type wound lying on its side surrounded by petechial like hemorrhage. She has had venous ablation on this side. She apparently does not have an arterial issue per Dr. Kellie Simmering. She saw her dermatologist thought it was "vascular". Patient is definitely going to need ongoing compression and I talked about this with her today she will go to elastic therapy after she leaves here next week 12/11/16; the patient's wound is not completely closed today. She has surrounding scar tissue and in further discussion with the patient it would appear that she had ulcers in this area in 2009 for a prolonged period of time ultimately requiring a punch biopsy of this area that only showed venous insufficiency. I did not  previously pickup on this part of the history from the patient. 12/18/16; the patient's wound is completely epithelialized. There is no open area here. She has significant bilateral venous insufficiency with secondary lymphedema to a mild-to-moderate degree she does not have compression stockings.. She did not say  anything to me when I was in the room, she told our intake nurse that she was still having pain in this area. This isn't unusual recurrent small open area. She is going to go to elastic therapy to obtain compression stockings. 12/25/16; the patient's wound is fully epithelialized. There is no open area here. The patient describes some continued episodic discomfort in this area medial left calf. However everything looks fine and healed here. She is been to elastic therapy and caught herself 15-20 mmHg stockings, they apparently were having trouble getting 20-30 mm stockings in her size KIONA, BLUME ([id number]) 01/22/17; this is a patient we discharged from the clinic a month ago. She has a recurrent open wound on her medial left calf. She had 15 mm support stockings. I told her I thought she needed 20-30 mm compression stockings. She tells me that she has been ill with hospitalization secondary to asthma and is been found to have severe hypokalemia likely secondary to a combination of Lasix and metolazone. This morning she noted blistering and leaking fluid on the posterior part of her left leg. She called our intake nurse urgently and we was saw her this afternoon. She has not had any real discomfort here. I don't know that she's been wearing any stockings on this leg for at least 2-3 days. ABIs in this clinic were 1.21 on the right and 1.3 on the left. She is previously seen vascular surgery who does not think that there is a peripheral arterial issue. 01/30/17; Patient arrives with no open wound on the left leg. She has been to elastic therapy and obtained 20-2mmhg below knee stockings  and she has one on the right leg today. READMISSION 02/19/18; this Sassaman is a now 73 year old patient we've had in this clinic perhaps 3 times before. I had last looked at her from January 07 December 2016 with an area on the medial left leg. We discharged her on 12/25/16 however she had to be readmitted on 01/22/17 with a recurrence. I have in my notes that we discharged her on 20-30 mm stockings although she tells me she was only wearing support hose because she cannot get stockings on predominantly related to her cervical spine surgery/issues. She has had previous ablations done by vein and vascular in [place] including a great saphenous vein ablation on the left with an anterior accessory branch ablation I think both of these were in 2016. On one of the previous visit she had a biopsy noted 2009 that was negative. She is not felt to have an arterial issue. She is not a diabetic. She does have a history of obstructive sleep apnea hypertension asthma as well as chronic venous insufficiency and lymphedema. On this occasion she noted 2 dry scaly patch on her left leg. She tried to put lotion on this it didn't really help. There were 2 open areas.the patient has been seeing her primary physician from 02/05/18 through 02/14/18. She had Unna boots applied. The superior wound now on the lateral left leg has closed but she's had one wound that remains open on the lateral left leg. This is not the same spot as we dealt with in 2018. ABIs in this clinic were 1.3 bilaterally 02/26/18; patient has a small wound on the left lateral calf. Dimensions are down. She has chronic venous insufficiency and lymphedema. 03/05/18; small open area on the left lateral calf. Dimensions are down. Tightly adherent necrotic debris over the surface of the wound which was difficult to remove.  Also the dressing [over collagen] stuck to the wound surface. This was removed with some difficulty as well. Change the primary dressing to  Hydrofera Blue ready 03/12/18; small open area on the left lateral calf. Comes in with tightly adherent surface eschar as well as some adherent Hydrofera Blue. 03/19/18; open area on the left lateral calf. Again adherent surface eschar as well as some adherent Hydrofera Blue nonviable subcutaneous tissue. She complained of pain all week even with the reduction from 4-3 layer compression I put on last week. Also she had an increase in her ankle and calf measurements probably related to the same thing. 03/26/18; open area on the left lateral calf. A very small open area remains here. We used silver alginate starting last week as the Hydrofera Blue seem to stick to the wound bed. In using 4-layer compression 04/02/18; the open area in the left lateral calf at some adherent slough which I removed there is no open area here. We are able to transition her into her own compression stocking. Truthfully I think this is probably his support hose. However this does not maintain skin integrity will be limited. She cannot put over the toe compression stockings on because of neck problems hand problems etc. She is allergic to the lining layer of juxta lites. We might be forced to use extremitease stocking should this fail READMIT 11/24/2018 Patient is now a 73 year old woman who is not a diabetic. She has been in this clinic on at least 3 previous occasions largely with recurrent wounds on her left leg secondary to chronic venous insufficiency with secondary lymphedema. Her situation is complicated by inability to get stockings on and an allergy to neoprene which is apparently a component and at least juxta lites and other stockings. As a result she really has not been wearing any stockings on her legs. She tells [place] that roughly 2 or 3 weeks ago she started noticing a stinging sensation just above her ankle on the left medial aspect. She has been diagnosed with pseudogout and she wondered whether this was what she  was experiencing. She tried to dress this with something she bought at the store however subsequently it pulled skin off and now she has an open wound that is not improving. She has been using Vaseline gauze with a cover bandage. She saw her primary doctor last week who put an Haematologist on her. ABIs in this clinic was 1.03 on the left NECIE, WILCOXSON. ([id number]) 2/12; the area is on the left medial ankle. Odd-looking wound with what looks to be surface epithelialization but a multitude of small petechial openings. This clearly not closed yet. We have been using silver alginate under 3 layer compression with TCA 2/19; the wound area did not look quite as good this week. Necrotic debris over the majority of the wound surface which required debridement. She continues to have a multitude of what looked to be small petechial openings. She reminds [place] that she had a biopsy on this initially during her first outbreak in 2015 in [place] [facility]. She expresses concern about this being a possible melanoma. 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 I assured her that this does not look anything like melanoma. Except for the petechial reaction it does look like a venous insufficiency area and she certainly has evidence of this on both sides 2/26; a difficult area on the left medial ankle. The patient  clearly has chronic venous hypertension with some degree of lymphedema. The odd thing about the area is the small petechial hemorrhages. I am not really sure how to explain this. This was present last time and this is not a compression injury. We have been using Hydrofera Blue which I changed to last week 3/4; still using Hydrofera Blue. Aggressive debridement today. She does not have known arterial issues. She has seen Dr. Kellie Simmering at [facility] and and has an ablation on the left. [Anterior accessory  branch of the greater saphenous]. From what I remember they did not feel she had an arterial issue. The patient has had this area biopsied in 2009 at [facility] and by her recollection they said this was "stasis". She is also follow-up with dermatology locally who thought that this was more of a vascular issue 3/11; using Hydrofera Blue. Aggressive debridement today. She does not have an arterial issue. We are using 3 layer compression although we may need to go to 4. The patient has been in for multiple changes to her wrap since I last saw her a week ago. She says that the area was leaking. I do not have too much more information on what was found 01/19/19 on evaluation today patient was actually being seen for a nurse visit when unfortunately she had the area on her left lateral lower extremity as well as weeping from the right lower extremity that became apparent. Therefore we did end up actually seeing her for a full visit with myself. She is having some pain at this site as well but fortunately nothing too significant at this point. No fevers, chills, nausea, or vomiting noted at this time. 3/18-Patient is back to the clinic with the left leg venous leg ulcer, the ulcer is larger in size, has a surface that is densely adherent with fibrinous tissue, the Hydrofera Blue was used but is densely adherent and there was difficulty in removing it. The right lower extremity was also wrapped for weeping edema. Patient has a new area over the left lateral foot above the malleolus that is small and appears to have no debris with intact surrounding skin. Patient is on increased dose of Lasix also as a means to edema management Electronic Signature(s) Signed: 01/21/2019 3:11:53 PM By: Tobi Bastos Entered By: Tobi Bastos on 01/21/2019 15:11:52 [name], [name] ([id number]) -------------------------------------------------------------------------------- Physical Exam Details Patient Name:  [name], [name]. Date of Service: 01/21/2019 2:00 PM Medical Record Number: [id number] Patient Account Number: [id number] Date of Birth/Sex: [birth year] (73 y.o. F) Treating RN: Cornell Barman Primary Care Provider: [name] Other Clinician: Referring Provider: [name] Treating Provider/Extender: Beverly Gust in Treatment: 6 Constitutional sitting or standing blood pressure is within target range for patient.. Cardiovascular 3+ pitting edema of the bilateral lower extremities. Notes Wound exam-the left leg medial wound above the malleolus has a combination of dense fibrinous bed on the upper aspect and more healthy granulation tissue in the lower aspect. Attempts at debridement of the densely adherent matrix is unyielding. Electronic Signature(s) Signed: 01/21/2019 3:13:25 PM By: Tobi Bastos Entered By: Tobi Bastos on 01/21/2019 15:13:24 [name] ([id number]) -------------------------------------------------------------------------------- Physician Orders Details Patient Name: [name], [name]. Date of Service: 01/21/2019 2:00 PM Medical Record Number: [id number] Patient Account Number: [id number] Date of Birth/Sex: [name] 17, 1946 (73 y.o. F) Treating RN: Cornell Barman Primary Care Provider: [name] Other Clinician: Referring Provider: [name] Treating Provider/Extender: Beverly Gust in Treatment: 6 Verbal / Phone Orders: No Diagnosis  Coding Wound Cleansing Wound #5 Left,Medial Lower Leg o Clean wound with Normal Saline. o May Shower, gently pat wound dry prior to applying new dressing. Wound #6 Left,Lateral Lower Leg o Clean wound with Normal Saline. o May Shower, gently pat wound dry prior to applying new dressing. Anesthetic (add to Medication List) Wound #5 Left,Medial Lower Leg o Topical Lidocaine 4% cream applied to wound bed prior to debridement (In Clinic Only). o Benzocaine Topical Anesthetic Spray  applied to wound bed prior to debridement (In Clinic Only). Wound #6 Left,Lateral Lower Leg o Topical Lidocaine 4% cream applied to wound bed prior to debridement (In Clinic Only). o Benzocaine Topical Anesthetic Spray applied to wound bed prior to debridement (In Clinic Only). Skin Barriers/Peri-Wound Care Wound #5 Left,Medial Lower Leg o Triamcinolone Acetonide Ointment (TCA) - on wound and peri-wound Wound #6 Left,Lateral Lower Leg o Triamcinolone Acetonide Ointment (TCA) - on wound and peri-wound Primary Wound Dressing Wound #5 Left,Medial Lower Leg o Hydrafera Blue Ready Transfer - with mepitel under hydrofera blue Wound #6 Left,Lateral Lower Leg o Hydrafera Blue Ready Transfer - with mepitel under hydrofera blue Secondary Dressing Wound #5 Left,Medial Lower Leg o ABD pad Wound #6 Left,Lateral Lower Leg o ABD pad Dressing Change Frequency Wound #5 Left,Medial Lower Leg o Change dressing every week ZAKAYLA, MARTINEC ([id number]) Wound #6 Left,Lateral Lower Leg o Change dressing every week Follow-up Appointments Wound #5 Left,Medial Lower Leg o Return Appointment in 1 week. o Nurse Visit as needed Wound #6 Left,Lateral Lower Leg o Return Appointment in 1 week. o Nurse Visit as needed Edema Control Wound #5 Left,Medial Lower Leg o 4 Layer Compression System - Bilateral - Unna paste to anchor Wound #6 Left,Lateral Lower Leg o 4 Layer Compression System - Bilateral - Unna paste to anchor Off-Loading Wound #5 Left,Medial Lower Leg o Other: - Elevate legs as needed Wound #6 Left,Lateral Lower Leg o Other: - Elevate legs as needed Electronic Signature(s) Signed: 01/21/2019 4:41:49 PM By: Tobi Bastos Signed: 01/21/2019 5:54:38 PM By: [name], [name], RN, CWS, [name], [name] Entered By: [name], [name], RN, CWS, Kim on 01/21/2019 15:07:14 [name]  ([id number]) -------------------------------------------------------------------------------- Progress Note Details Patient Name: [name], [name]. Date of Service: 01/21/2019 2:00 PM Medical Record Number: [id number] Patient Account Number: [id number] Date of Birth/Sex: [birth year] (73 y.o. F) Treating RN: Cornell Barman Primary Care Provider: [name] Other Clinician: Referring Provider: [name] Treating Provider/Extender: Beverly Gust in Treatment: 6 Subjective History of Present Illness (HPI) Pleasant 73 year old with history of chronic venous insufficiency. No diabetes or peripheral vascular disease. Left ABI 1.29. Questionable history of left lower extremity DVT. She developed a recurrent ulceration on her left lateral calf in December 2015, which she attributes to poor diet and subsequent lower extremity edema. She underwent endovenous laser ablation of her left greater saphenous vein in 2010. She underwent laser ablation of accessory branch of left GSV in April 2016 by Dr. Kellie Simmering at [facility]. She was previously wearing Unna boots, which she tolerated well. Tolerating 2 layer compression and cadexomer iodine. She returns to clinic for follow-up and is without new complaints. She denies any significant pain at this time. She reports persistent pain with pressure. No claudication or ischemic rest pain. No fever or chills. No drainage. READMISSION 11/13/16; this is a 73 year old woman who is not a diabetic. She is here for a review of a painful area on her left medial lower extremity. I note that she was seen here previously last year  for wound I believe to be in the same area. At that time she had undergone previously a left greater saphenous vein ablation by Dr. Kellie Simmering and she had a ablation of the anterior accessory branch of the left greater saphenous vein in March 2016. Seeing that the wound actually closed over. In reviewing the history with her today the ulcer  in this area has been recurrent. She describes a biopsy of this area in 2009 that only showed stasis physiology. 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 Do not see that she has ever had arterial studies in the left leg. She tells me she has a follow-up with Dr. Kellie Simmering in roughly 10 days In any case she developed the reopening of this area roughly a month ago. On the background of this she describes rapidly increasing edema which has responded to Lasix 40 mg and metolazone 2.5 mg as well as the patient's lymph massage. She has been told she has both venous insufficiency and lymphedema but she cannot tolerate compression stockings 11/28/16; the patient saw Dr. Kellie Simmering recently. Per the patient he did arterial Dopplers in the office that did not show evidence of arterial insufficiency, per the patient he stated "treat this like an ordinary venous ulcer". She also saw her dermatologist Dr. Ronnald Ramp who felt that this was more of a vascular ulcer. In general things are improving although she arrives today with increasing bilateral lower extremity edema with weeping a deeper fluid through the wound on the left medial leg compatible with some degree of lymphedema 12/04/16; the patient's wound is fully epithelialized but I don't think fully healed. We will do another week of depression with Promogran and TCA however I suspect we'll be able to discharge her next week. This is a very unusual-looking wound which was initially a figure-of-eight type wound lying on its side surrounded by petechial like hemorrhage. She has had venous ablation on this side. She apparently does not have an arterial issue per Dr. Kellie Simmering. She saw her dermatologist thought it was "vascular". Patient is definitely going  to need ongoing compression and I talked about this with her today she will go to elastic therapy after she leaves here next week 12/11/16; the patient's wound is not completely closed today. She has surrounding scar tissue and in further discussion with the patient it would appear that she had ulcers in this area in 2009 for a prolonged period of time ultimately requiring a punch biopsy of this area that only showed venous insufficiency. I did not previously pickup on this part of the history from the patient. 12/18/16; the patient's wound is completely epithelialized. There is no open area here. She has significant bilateral venous insufficiency with secondary lymphedema to a mild-to-moderate degree she does not have compression stockings.. She did not say anything to me when I was in the room, she told our intake nurse that she was still having pain in this area. This isn't unusual recurrent small open area. She is going to go to elastic therapy to obtain compression stockings. 12/25/16; the patient's wound is fully epithelialized. There is no open area here. The patient describes some continued episodic discomfort in this area medial left calf. However everything looks fine and healed here. She is been to elastic therapy  and [name], [name] ([id number]) caught herself 15-20 mmHg stockings, they apparently were having trouble getting 20-30 mm stockings in her size 01/22/17; this is a patient we discharged from the clinic a month ago. She has a recurrent open wound on her medial left calf. She had 15 mm support stockings. I told her I thought she needed 20-30 mm compression stockings. She tells me that she has been ill with hospitalization secondary to asthma and is been found to have severe hypokalemia likely secondary to a combination of Lasix and metolazone. This morning she noted blistering and leaking fluid on the posterior part of her left leg. She called our intake nurse urgently and we was saw  her this afternoon. She has not had any real discomfort here. I don't know that she's been wearing any stockings on this leg for at least 2-3 days. ABIs in this clinic were 1.21 on the right and 1.3 on the left. She is previously seen vascular surgery who does not think that there is a peripheral arterial issue. 01/30/17; Patient arrives with no open wound on the left leg. She has been to elastic therapy and obtained 20-35mmhg below knee stockings and she has one on the right leg today. READMISSION 02/19/18; this Dieujuste is a now 73 year old patient we've had in this clinic perhaps 3 times before. I had last looked at her from January 07 December 2016 with an area on the medial left leg. We discharged her on 12/25/16 however she had to be readmitted on 01/22/17 with a recurrence. I have in my notes that we discharged her on 20-30 mm stockings although she tells me she was only wearing support hose because she cannot get stockings on predominantly related to her cervical spine surgery/issues. She has had previous ablations done by vein and vascular in [place] including a great saphenous vein ablation on the left with an anterior accessory branch ablation I think both of these were in 2016. On one of the previous visit she had a biopsy noted 2009 that was negative. She is not felt to have an arterial issue. She is not a diabetic. She does have a history of obstructive sleep apnea hypertension asthma as well as chronic venous insufficiency and lymphedema. On this occasion she noted 2 dry scaly patch on her left leg. She tried to put lotion on this it didn't really help. There were 2 open areas.the patient has been seeing her primary physician from 02/05/18 through 02/14/18. She had Unna boots applied. The superior wound now on the lateral left leg has closed but she's had one wound that remains open on the lateral left leg. This is not the same spot as we dealt with in 2018. ABIs in this clinic were 1.3  bilaterally 02/26/18; patient has a small wound on the left lateral calf. Dimensions are down. She has chronic venous insufficiency and lymphedema. 03/05/18; small open area on the left lateral calf. Dimensions are down. Tightly adherent necrotic debris over the surface of the wound which was difficult to remove. Also the dressing [over collagen] stuck to the wound surface. This was removed with some difficulty as well. Change the primary dressing to Hydrofera Blue ready 03/12/18; small open area on the left lateral calf. Comes in with tightly adherent surface eschar as well as some adherent Hydrofera Blue. 03/19/18; open area on the left lateral calf. Again adherent surface eschar as well as some adherent Hydrofera Blue nonviable subcutaneous tissue. She complained of pain all week even with the  reduction from 4-3 layer compression I put on last week. Also she had an increase in her ankle and calf measurements probably related to the same thing. 03/26/18; open area on the left lateral calf. A very small open area remains here. We used silver alginate starting last week as the Hydrofera Blue seem to stick to the wound bed. In using 4-layer compression 04/02/18; the open area in the left lateral calf at some adherent slough which I removed there is no open area here. We are able to transition her into her own compression stocking. Truthfully I think this is probably his support hose. However this does not maintain skin integrity will be limited. She cannot put over the toe compression stockings on because of neck problems hand problems etc. She is allergic to the lining layer of juxta lites. We might be forced to use extremitease stocking should this fail READMIT 11/24/2018 Patient is now a 73 year old woman who is not a diabetic. She has been in this clinic on at least 3 previous occasions largely with recurrent wounds on her left leg secondary to chronic venous insufficiency with secondary lymphedema.  Her situation is complicated by inability to get stockings on and an allergy to neoprene which is apparently a component and at least juxta lites and other stockings. As a result she really has not been wearing any stockings on her legs. She tells [place] that roughly 2 or 3 weeks ago she started noticing a stinging sensation just above her ankle on the left medial aspect. She has been diagnosed with pseudogout and she wondered whether this was what she was experiencing. She tried to dress this with something she bought at the store however subsequently it pulled skin off and now she has an open wound that is not improving. She has been using Vaseline gauze with a cover bandage. She saw her primary doctor last week who put an Haematologist on her. ABIs in this clinic was 1.03 on the left [name], [name]. ([id number]) 2/12; the area is on the left medial ankle. Odd-looking wound with what looks to be surface epithelialization but a multitude of small petechial openings. This clearly not closed yet. We have been using silver alginate under 3 layer compression with TCA 2/19; the wound area did not look quite as good this week. Necrotic debris over the majority of the wound surface which required debridement. She continues to have a multitude of what looked to be small petechial openings. She reminds [place] that she had a biopsy on this initially during her first outbreak in 2015 in [facility]. She expresses concern about this being a possible melanoma. 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 I assured her that this does not look anything like melanoma. Except for the petechial reaction it does look like a venous insufficiency area and she certainly has evidence of this on both sides 2/26; a difficult area on the left medial ankle. The patient clearly has chronic venous hypertension with some degree  of lymphedema. The odd thing about the area is the small petechial hemorrhages. I am not really sure how to explain this. This was present last time and this is not a compression injury. We have been using Hydrofera Blue which I changed to last week 3/4; still using Hydrofera Blue. Aggressive debridement today. She does not have known arterial issues. She has seen Dr. Kellie Simmering at [facility] vein [facility] vascular and  and has an ablation on the left. [Anterior accessory branch of the greater saphenous]. From what I remember they did not feel she had an arterial issue. The patient has had this area biopsied in 2009 at [facility] dermatology and by her recollection they said this was "stasis". She is also follow-up with dermatology locally who thought that this was more of a vascular issue 3/11; using Hydrofera Blue. Aggressive debridement today. She does not have an arterial issue. We are using 3 layer compression although we may need to go to 4. The patient has been in for multiple changes to her wrap since I last saw her a week ago. She says that the area was leaking. I do not have too much more information on what was found 01/19/19 on evaluation today patient was actually being seen for a nurse visit when unfortunately she had the area on her left lateral lower extremity as well as weeping from the right lower extremity that became apparent. Therefore we did end up actually seeing her for a full visit with myself. She is having some pain at this site as well but fortunately nothing too significant at this point. No fevers, chills, nausea, or vomiting noted at this time. 3/18-Patient is back to the clinic with the left leg venous leg ulcer, the ulcer is larger in size, has a surface that is densely adherent with fibrinous tissue, the Hydrofera Blue was used but is densely adherent and there was difficulty in removing it. The right lower extremity was also wrapped for weeping edema. Patient has a new area  over the left lateral foot above the malleolus that is small and appears to have no debris with intact surrounding skin. Patient is on increased dose of Lasix also as a means to edema management Objective Constitutional sitting or standing blood pressure is within target range for patient.. Vitals Time Taken: 2:20 PM, Height: 63 in, Weight: 224.7 lbs, BMI: 39.8, Temperature: 98.3 F, Pulse: 63 bpm, Respiratory Rate: 18 breaths/min, Blood Pressure: 142/68 mmHg. Cardiovascular 3+ pitting edema of the bilateral lower extremities. General Notes: Wound exam-the left leg medial wound above the malleolus has a combination of dense fibrinous bed on the upper aspect and more healthy granulation tissue in the lower aspect. Attempts at debridement of the densely adherent matrix is unyielding. Integumentary (Hair, Skin) Wound #5 status is Open. Original cause of wound was Gradually Appeared. The wound is located on the Left,Medial Lower Leg. The wound measures 3cm length x 2.5cm width x 0.1cm depth; 5.89cm^2 area and 0.589cm^3 volume. There is Fat [name], [name]. ([id number]) Layer (Subcutaneous Tissue) Exposed exposed. There is no tunneling or undermining noted. There is a medium amount of serosanguineous drainage noted. The wound margin is indistinct and nonvisible. There is medium (34-66%) red, pink granulation within the wound bed. There is a medium (34-66%) amount of necrotic tissue within the wound bed including Adherent Slough. The periwound skin appearance exhibited: Maceration, Hemosiderin Staining. The periwound skin appearance did not exhibit: Callus, Crepitus, Excoriation, Induration, Rash, Scarring, Dry/Scaly, Atrophie Blanche, Cyanosis, Ecchymosis, Mottled, Pallor, Rubor, Erythema. Periwound temperature was noted as No Abnormality. The periwound has tenderness on palpation. Wound #6 status is Open. Original cause of wound was Gradually Appeared. The wound is located on the Left,Lateral  Lower Leg. The wound measures 1cm length x 1.2cm width x 0.1cm depth; 0.942cm^2 area and 0.094cm^3 volume. There is Fat Layer (Subcutaneous Tissue) Exposed exposed. There is no tunneling or undermining noted. There is a medium amount of  serous drainage noted. The wound margin is flat and intact. There is large (67-100%) red granulation within the wound bed. There is a small (1-33%) amount of necrotic tissue within the wound bed including Adherent Slough. The periwound skin appearance exhibited: Hemosiderin Staining. The periwound skin appearance did not exhibit: Callus, Crepitus, Excoriation, Induration, Rash, Scarring, Dry/Scaly, Maceration, Atrophie Blanche, Cyanosis, Ecchymosis, Mottled, Pallor, Rubor, Erythema. Periwound temperature was noted as No Abnormality. The periwound has tenderness on palpation. Procedures Wound #5 Pre-procedure diagnosis of Wound #5 is a Lymphedema located on the Left,Medial Lower Leg . There was a Excisional Skin/Subcutaneous Tissue Debridement with a total area of 7.5 sq cm performed by Tobi Bastos, MD. With the following instrument(s): Curette to remove Viable and Non-Viable tissue/material. Material removed includes Eschar, Subcutaneous Tissue, Slough, and Biofilm after achieving pain control using Lidocaine. No specimens were taken. A time out was conducted at 15:02, prior to the start of the procedure. A Minimum amount of bleeding was controlled with Pressure. The procedure was tolerated well. Post Debridement Measurements: 3cm length x 2.5cm width x 0.2cm depth; 1.178cm^3 volume. Character of Wound/Ulcer Post Debridement requires further debridement. Post procedure Diagnosis Wound #5: Same as Pre-Procedure Wound #6 Pre-procedure diagnosis of Wound #6 is a Venous Leg Ulcer located on the Left,Lateral Lower Leg .Severity of Tissue Pre Debridement is: Fat layer exposed. There was a Excisional Skin/Subcutaneous Tissue Debridement with a total area of 1.2  sq cm performed by Tobi Bastos, MD. With the following instrument(s): Curette to remove Viable and Non-Viable tissue/material. Material removed includes Subcutaneous Tissue and Slough and after achieving pain control using Lidocaine. No specimens were taken. A time out was conducted at 15:02, prior to the start of the procedure. A Minimum amount of bleeding was controlled with Pressure. The procedure was tolerated well. Post Debridement Measurements: 1cm length x 1.2cm width x 0.2cm depth; 0.188cm^3 volume. Character of Wound/Ulcer Post Debridement requires further debridement. Severity of Tissue Post Debridement is: Fat layer exposed. Post procedure Diagnosis Wound #6: Same as Pre-Procedure Plan Wound Cleansing: Wound #5 Left,Medial Lower Leg: Clean wound with Normal Saline. May Shower, gently pat wound dry prior to applying new dressing. Wound #6 Left,Lateral Lower Leg: [name], GRANBERRY ([id number]) Clean wound with Normal Saline. May Shower, gently pat wound dry prior to applying new dressing. Anesthetic (add to Medication List): Wound #5 Left,Medial Lower Leg: Topical Lidocaine 4% cream applied to wound bed prior to debridement (In Clinic Only). Benzocaine Topical Anesthetic Spray applied to wound bed prior to debridement (In Clinic Only). Wound #6 Left,Lateral Lower Leg: Topical Lidocaine 4% cream applied to wound bed prior to debridement (In Clinic Only). Benzocaine Topical Anesthetic Spray applied to wound bed prior to debridement (In Clinic Only). Skin Barriers/Peri-Wound Care: Wound #5 Left,Medial Lower Leg: Triamcinolone Acetonide Ointment (TCA) - on wound and peri-wound Wound #6 Left,Lateral Lower Leg: Triamcinolone Acetonide Ointment (TCA) - on wound and peri-wound Primary Wound Dressing: Wound #5 Left,Medial Lower Leg: Hydrafera Blue Ready Transfer - with mepitel under hydrofera blue Wound #6 Left,Lateral Lower Leg: Hydrafera Blue Ready Transfer - with mepitel under  hydrofera blue Secondary Dressing: Wound #5 Left,Medial Lower Leg: ABD pad Wound #6 Left,Lateral Lower Leg: ABD pad Dressing Change Frequency: Wound #5 Left,Medial Lower Leg: Change dressing every week Wound #6 Left,Lateral Lower Leg: Change dressing every week Follow-up Appointments: Wound #5 Left,Medial Lower Leg: Return Appointment in 1 week. Nurse Visit as needed Wound #6 Left,Lateral Lower Leg: Return Appointment in 1 week. Nurse Visit as needed Edema  Control: Wound #5 Left,Medial Lower Leg: 4 Layer Compression System - Bilateral - Unna paste to anchor Wound #6 Left,Lateral Lower Leg: 4 Layer Compression System - Bilateral - Unna paste to anchor Off-Loading: Wound #5 Left,Medial Lower Leg: Other: - Elevate legs as needed Wound #6 Left,Lateral Lower Leg: Other: - Elevate legs as needed 1. Continue with the [facility] but use Mepitel layer underneath to avoid the stickiness, continue 3 layer compression wrap on the left 2. Can avoid wrapping the right leg has improved and patient requests if we can watch it for the time being 3. Return to clinic in 2 weeks as planned Electronic Signature(s) Signed: 01/21/2019 3:14:28 PM By: Carvel Getting ([id number]) Entered By: Tobi Bastos on 01/21/2019 15:14:27 VIVAN, VANDERVEER ([id number]) -------------------------------------------------------------------------------- SuperBill Details Patient Name: CECELY, RENGEL. Date of Service: 01/21/2019 Medical Record Number: [id number] Patient Account Number: [id number] Date of Birth/Sex: 07-05-1946 (73 y.o. F) Treating RN: Cornell Barman Primary Care Provider: [name] Other Clinician: Referring Provider: [name] Treating Provider/Extender: Beverly Gust in Treatment: 6 Diagnosis Coding ICD-10 Codes Code Description [phone] Non-pressure chronic ulcer of left calf limited to breakdown of skin I87.321 Chronic venous hypertension (idiopathic)  with inflammation of right lower extremity I89.0 Lymphedema, not elsewhere classified Facility Procedures CPT4 Code: [id number] Description: 27517 - DEB SUBQ TISSUE 20 SQ CM/< ICD-10 Diagnosis Description L97.221 Non-pressure chronic ulcer of left calf limited to breakdown Modifier: of skin Quantity: 1 Physician Procedures CPT4 Code: [id number] Description: 49675 - WC PHYS SUBQ TISS 20 SQ CM ICD-10 Diagnosis Description L97.221 Non-pressure chronic ulcer of left calf limited to breakdown Modifier: of skin Quantity: 1 Electronic Signature(s) Signed: 01/21/2019 3:14:52 PM By: Tobi Bastos Entered By: Tobi Bastos on 01/21/2019 15:14:51

## 2019-01-23 ENCOUNTER — Other Ambulatory Visit: Payer: Self-pay

## 2019-01-23 DIAGNOSIS — I890 Lymphedema, not elsewhere classified: Secondary | ICD-10-CM | POA: Diagnosis not present

## 2019-01-23 DIAGNOSIS — L97221 Non-pressure chronic ulcer of left calf limited to breakdown of skin: Secondary | ICD-10-CM | POA: Diagnosis not present

## 2019-01-23 DIAGNOSIS — Z86718 Personal history of other venous thrombosis and embolism: Secondary | ICD-10-CM | POA: Diagnosis not present

## 2019-01-23 DIAGNOSIS — I10 Essential (primary) hypertension: Secondary | ICD-10-CM | POA: Diagnosis not present

## 2019-01-23 DIAGNOSIS — I872 Venous insufficiency (chronic) (peripheral): Secondary | ICD-10-CM | POA: Diagnosis not present

## 2019-01-23 DIAGNOSIS — J45909 Unspecified asthma, uncomplicated: Secondary | ICD-10-CM | POA: Diagnosis not present

## 2019-01-26 ENCOUNTER — Other Ambulatory Visit: Payer: Self-pay

## 2019-01-26 DIAGNOSIS — I872 Venous insufficiency (chronic) (peripheral): Secondary | ICD-10-CM | POA: Diagnosis not present

## 2019-01-26 DIAGNOSIS — I10 Essential (primary) hypertension: Secondary | ICD-10-CM | POA: Diagnosis not present

## 2019-01-26 DIAGNOSIS — L97221 Non-pressure chronic ulcer of left calf limited to breakdown of skin: Secondary | ICD-10-CM | POA: Diagnosis not present

## 2019-01-26 DIAGNOSIS — I890 Lymphedema, not elsewhere classified: Secondary | ICD-10-CM | POA: Diagnosis not present

## 2019-01-26 DIAGNOSIS — J45909 Unspecified asthma, uncomplicated: Secondary | ICD-10-CM | POA: Diagnosis not present

## 2019-01-26 DIAGNOSIS — Z86718 Personal history of other venous thrombosis and embolism: Secondary | ICD-10-CM | POA: Diagnosis not present

## 2019-01-26 NOTE — Progress Notes (Signed)
[name], [name] ([id number]) Visit Report for 01/26/2019 Arrival Information Details Patient Name: [name], [name]. Date of Service: 01/26/2019 2:15 PM Medical Record Number: [id number] Patient Account Number: [id number] Date of Birth/Sex: 11-Sep-1946 (73 y.o. F) Treating RN: Montey Hora Primary Care Marleen Moret: [name] Other Clinician: Referring Anita Laguna: [name] Treating Coben Godshall/Extender: Melburn Hake, HOYT Weeks in Treatment: 6 Visit Information History Since Last Visit Added or deleted any medications: No Patient Arrived: Ambulatory Any new allergies or adverse reactions: No Arrival Time: 14:24 Had a fall or experienced change in No Accompanied By: self activities of daily living that may affect Transfer Assistance: None risk of falls: Patient Identification Verified: Yes Signs or symptoms of abuse/neglect since last visito No Secondary Verification Process Completed: Yes Hospitalized since last visit: No Implantable device outside of the clinic excluding No cellular tissue based products placed in the center since last visit: Has Dressing in Place as Prescribed: Yes Has Compression in Place as Prescribed: Yes Pain Present Now: No Electronic Signature(s) Signed: 01/26/2019 3:31:49 PM By: Montey Hora Entered By: Montey Hora on 01/26/2019 14:42:07 Mathia, [name] ([id number]) -------------------------------------------------------------------------------- Compression Therapy Details Patient Name: [name], [name]. Date of Service: 01/26/2019 2:15 PM Medical Record Number: [id number] Patient Account Number: [id number] Date of Birth/Sex: 16-Apr-1946 (73 y.o. F) Treating RN: Montey Hora Primary Care Ingri Diemer: [name] Other Clinician: Referring Oluwatobi Visser: [name] Treating Hobson Lax/Extender: Melburn Hake, HOYT Weeks in Treatment: 6 Compression Therapy Performed for Wound Assessment: Wound #5 Left,Medial Lower Leg Performed By: Clinician  Montey Hora, RN Compression Type: Four Layer Pre Treatment ABI: 1 Electronic Signature(s) Signed: 01/26/2019 3:31:49 PM By: Montey Hora Entered By: Montey Hora on 01/26/2019 14:43:09 Bisaillon, [name] ([id number]) -------------------------------------------------------------------------------- Compression Therapy Details Patient Name: [name], [name]. Date of Service: 01/26/2019 2:15 PM Medical Record Number: [id number] Patient Account Number: [id number] Date of Birth/Sex: 08-Oct-1946 (73 y.o. F) Treating RN: Montey Hora Primary Care Sharelle Burditt: [name] Other Clinician: Referring Tyren Dugar: [name] Treating Aveon Colquhoun/Extender: STONE III, HOYT Weeks in Treatment: 6 Compression Therapy Performed for Wound Assessment: Wound #6 Left,Lateral Lower Leg Performed By: Clinician Montey Hora, RN Compression Type: Four Layer Pre Treatment ABI: 1 Electronic Signature(s) Signed: 01/26/2019 3:31:49 PM By: Montey Hora Entered By: Montey Hora on 01/26/2019 14:43:10 Wojdyla, [name] ([id number]) -------------------------------------------------------------------------------- Encounter Discharge Information Details Patient Name: [name], [name]. Date of Service: 01/26/2019 2:15 PM Medical Record Number: [id number] Patient Account Number: [id number] Date of Birth/Sex: 12/04/1945 (73 y.o. F) Treating RN: Montey Hora Primary Care Joslyn Ramos: [name] Other Clinician: Referring Jayline Kilburg: [name] Treating Saprina Chuong/Extender: Melburn Hake, HOYT Weeks in Treatment: 6 Encounter Discharge Information Items Discharge Condition: Stable Ambulatory Status: Ambulatory Discharge Destination: Home Transportation: Private Auto Accompanied By: self Schedule Follow-up Appointment: No Clinical Summary of Care: Electronic Signature(s) Signed: 01/26/2019 3:31:49 PM By: Montey Hora Entered By: Montey Hora on 01/26/2019 14:44:01 Plocher, [name]  ([id number]) -------------------------------------------------------------------------------- Patient/Caregiver Education Details Patient Name: [name], [name]. Date of Service: 01/26/2019 2:15 PM Medical Record Number: [id number] Patient Account Number: [id number] Date of Birth/Gender: 04-22-1946 (73 y.o. F) Treating RN: Montey Hora Primary Care Physician: [name] Other Clinician: Referring Physician: [name] Treating Physician/Extender: Sharalyn Ink in Treatment: 6 Education Assessment Education Provided To: Patient Education Topics Provided Venous: Handouts: Other: leg elevation Methods: Explain/Verbal Responses: State content correctly Electronic Signature(s) Signed: 01/26/2019 3:31:49 PM By: Montey Hora Entered By: Montey Hora on 01/26/2019 14:43:51 Nims, [name] ([id number]) -------------------------------------------------------------------------------- Wound Assessment Details Patient Name: [name], [name]. Date of Service: 01/26/2019 2:15  PM Medical Record Number: [id number] Patient Account Number: [id number] Date of Birth/Sex: 1946/01/31 (73 y.o. F) Treating RN: Montey Hora Primary Care Aizley Stenseth: [name] Other Clinician: Referring Devery Murgia: [name] Treating Gus Littler/Extender: STONE III, HOYT Weeks in Treatment: 6 Wound Status Wound Number: 5 Primary Lymphedema Etiology: Wound Location: Left Lower Leg - Medial Wound Open Wounding Event: Gradually Appeared Status: Date Acquired: 11/19/2018 Comorbid Cataracts, Asthma, Sleep Apnea, Deep Vein Weeks Of Treatment: 6 History: Thrombosis, Hypertension, Peripheral Venous Clustered Wound: No Disease, Osteoarthritis, Received Chemotherapy, Received Radiation Photos Photo Uploaded By: Montey Hora on 01/26/2019 15:16:31 Wound Measurements Length: (cm) 3 Width: (cm) 2.5 Depth: (cm) 0.1 Area: (cm) 5.89 Volume: (cm) 0.589 % Reduction in Area: 31.1% %  Reduction in Volume: 31.1% Epithelialization: Small (1-33%) Tunneling: No Undermining: No Wound Description Full Thickness Without Exposed Support Classification: Structures Wound Margin: Indistinct, nonvisible Exudate Medium Amount: Exudate Type: Serosanguineous Exudate Color: red, brown Foul Odor After Cleansing: No Slough/Fibrino Yes Wound Bed Granulation Amount: Medium (34-66%) Exposed Structure Granulation Quality: Red, Pink Fascia Exposed: No Necrotic Amount: Medium (34-66%) Fat Layer (Subcutaneous Tissue) Exposed: Yes Necrotic Quality: Adherent Slough Tendon Exposed: No Muscle Exposed: No Joint Exposed: No WAJIHA, VERSTEEG. ([id number]) Bone Exposed: No Periwound Skin Texture Texture Color No Abnormalities Noted: No No Abnormalities Noted: No Callus: No Atrophie Blanche: No Crepitus: No Cyanosis: No Excoriation: No Ecchymosis: No Induration: No Erythema: No Rash: No Hemosiderin Staining: Yes Scarring: No Mottled: No Pallor: No Moisture Rubor: No No Abnormalities Noted: No Dry / Scaly: No Temperature / Pain Maceration: Yes Temperature: No Abnormality Tenderness on Palpation: Yes Wound Preparation Ulcer Cleansing: Rinsed/Irrigated with Saline, Other: soap and water, Topical Anesthetic Applied: None Treatment Notes Wound #5 (Left, Medial Lower Leg) Notes mepitel, hydrafera blue, abd, 4 layer wrap Electronic Signature(s) Signed: 01/26/2019 3:31:49 PM By: Montey Hora Entered By: Montey Hora on 01/26/2019 14:42:37 Arenz, [name] ([id number]) -------------------------------------------------------------------------------- Wound Assessment Details Patient Name: [name], [name]. Date of Service: 01/26/2019 2:15 PM Medical Record Number: [id number] Patient Account Number: [id number] Date of Birth/Sex: 09-Feb-1946 (73 y.o. F) Treating RN: Montey Hora Primary Care Ople Girgis: [name] Other Clinician: Referring Cullen Lahaie: [name] Treating Mohsen Odenthal/Extender: STONE III, HOYT Weeks in Treatment: 6 Wound Status Wound Number: 6 Primary Venous Leg Ulcer Etiology: Wound Location: Left Lower Leg - Lateral Wound Open Wounding Event: Gradually Appeared Status: Date Acquired: 01/19/2019 Comorbid Cataracts, Asthma, Sleep Apnea, Deep Vein Weeks Of Treatment: 1 History: Thrombosis, Hypertension, Peripheral Venous Clustered Wound: No Disease, Osteoarthritis, Received Chemotherapy, Received Radiation Photos Photo Uploaded By: Montey Hora on 01/26/2019 15:16:32 Wound Measurements Length: (cm) 1 Width: (cm) 1.2 Depth: (cm) 0.1 Area: (cm) 0.942 Volume: (cm) 0.094 % Reduction in Area: -328.2% % Reduction in Volume: -327.3% Epithelialization: None Tunneling: No Undermining: No Wound Description Full Thickness Without Exposed Support Classification: Structures Wound Margin: Flat and Intact Exudate Medium Amount: Exudate Type: Serous Exudate Color: amber Foul Odor After Cleansing: No Slough/Fibrino Yes Wound Bed Granulation Amount: Large (67-100%) Exposed Structure Granulation Quality: Red Fascia Exposed: No Necrotic Amount: Small (1-33%) Fat Layer (Subcutaneous Tissue) Exposed: Yes Necrotic Quality: Adherent Slough Tendon Exposed: No Muscle Exposed: No Joint Exposed: No Ade, Denice J. ([id number]) Bone Exposed: No Periwound Skin Texture Texture Color No Abnormalities Noted: No No Abnormalities Noted: No Callus: No Atrophie Blanche: No Crepitus: No Cyanosis: No Excoriation: No Ecchymosis: No Induration: No Erythema: No Rash: No Hemosiderin Staining: Yes Scarring: No Mottled: No Pallor: No Moisture Rubor: No No Abnormalities Noted: No Dry / Scaly: No Temperature /  Pain Maceration: No Temperature: No Abnormality Tenderness on Palpation: Yes Wound Preparation Ulcer Cleansing: Other: soap and water, Topical Anesthetic Applied: None Treatment Notes Wound #6 (Left, Lateral  Lower Leg) Notes mepitel, hydrafera blue, abd, 4 layer wrap Electronic Signature(s) Signed: 01/26/2019 3:31:49 PM By: Montey Hora Entered By: Montey Hora on 01/26/2019 14:42:50

## 2019-01-27 NOTE — Progress Notes (Signed)
[name] [name] ([id number]) Visit Report for 01/21/2019 Arrival Information Details Patient Name: [name] [name], [name] [name]. Date of Service: 01/21/2019 2:00 PM Medical Record Number: [id number] Patient Account Number: [id number] Date of Birth/Sex: 1946/05/27 (73 y.o. F) Treating RN: Harold Barban Primary Care Donya Hitch: [name] Other Clinician: Referring Dora Clauss: [name] Treating Oveta Idris/Extender: Beverly Gust in Treatment: 6 Visit Information History Since Last Visit Added or deleted any medications: No Patient Arrived: Ambulatory Any new allergies or adverse reactions: No Arrival Time: 14:25 Had a fall or experienced change in No Accompanied By: self activities of daily living that may affect Transfer Assistance: None risk of falls: Patient Identification Verified: Yes Signs or symptoms of abuse/neglect since last visito No Secondary Verification Process Completed: Yes Hospitalized since last visit: No Has Dressing in Place as Prescribed: Yes Has Compression in Place as Prescribed: Yes Pain Present Now: No Electronic Signature(s) Signed: 01/27/2019 9:04:30 AM By: Harold Barban Entered By: Harold Barban on 01/21/2019 14:25:57 [name] [name] Child ([id number]) -------------------------------------------------------------------------------- Encounter Discharge Information Details Patient Name: [name] [name], [name] [name]. Date of Service: 01/21/2019 2:00 PM Medical Record Number: [id number] Patient Account Number: [id number] Date of Birth/Sex: 09/21/1946 (73 y.o. F) Treating RN: Montey Hora Primary Care Adaliah Hiegel: [name] Other Clinician: Referring Ysabelle Goodroe: [name] Treating Monik Lins/Extender: Beverly Gust in Treatment: 6 Encounter Discharge Information Items Post Procedure Vitals Discharge Condition: Stable Temperature (F): 98.3 Ambulatory Status: Ambulatory Pulse (bpm): 63 Discharge Destination: Home Respiratory Rate (breaths/min):  18 Transportation: Private Auto Blood Pressure (mmHg): 142/68 Accompanied By: self Schedule Follow-up Appointment: Yes Clinical Summary of Care: Electronic Signature(s) Signed: 01/21/2019 4:54:53 PM By: Montey Hora Entered By: Montey Hora on 01/21/2019 15:25:53 [name] [name] Child ([id number]) -------------------------------------------------------------------------------- Lower Extremity Assessment Details Patient Name: [name] [name], [name] [name]. Date of Service: 01/21/2019 2:00 PM Medical Record Number: [id number] Patient Account Number: [id number] Date of Birth/Sex: Dec 09, 1945 (73 y.o. F) Treating RN: Harold Barban Primary Care Jamorris Ndiaye: [name] Other Clinician: Referring Bowe Sidor: [name] Treating Annya Lizana/Extender: Beverly Gust in Treatment: 6 Edema Assessment Assessed: [Left: No] [Right: No] [Left: Edema] [Right: :] Calf Left: Right: Point of Measurement: 32 cm From Medial Instep 43 cm cm Ankle Left: Right: Point of Measurement: 12 cm From Medial Instep 24.7 cm cm Vascular Assessment Pulses: Dorsalis Pedis Palpable: [Left:Yes] Posterior Tibial Palpable: [Left:Yes] Extremity colors, hair growth, and conditions: Extremity Color: [Left:Hyperpigmented] Hair Growth on Extremity: [Left:Yes] Temperature of Extremity: [Left:Warm] Capillary Refill: [Left:< 3 seconds] Toe Nail Assessment Left: Right: Thick: Yes Discolored: Yes Deformed: Yes Improper Length and Hygiene: No Electronic Signature(s) Signed: 01/27/2019 9:04:30 AM By: Harold Barban Entered By: Harold Barban on 01/21/2019 14:47:56 [name] [name] Child ([id number]) -------------------------------------------------------------------------------- Multi Wound Chart Details Patient Name: [name] [name], [name] [name]. Date of Service: 01/21/2019 2:00 PM Medical Record Number: [id number] Patient Account Number: [id number] Date of Birth/Sex: Sep 29, 1946 (73 y.o. F) Treating RN: Cornell Barman Primary Care Avonte Sensabaugh:  [name] Other Clinician: Referring Kam Kushnir: [name] Treating Josafat Enrico/Extender: Beverly Gust in Treatment: 6 Vital Signs Height(in): 63 Pulse(bpm): 67 Weight(lbs): 224.7 Blood Pressure(mmHg): 142/68 Body Mass Index(BMI): 40 Temperature(F): 98.3 Respiratory Rate 18 (breaths/min): Photos: [N/A:N/A] Wound Location: Left Lower Leg - Medial Left Lower Leg - Lateral N/A Wounding Event: Gradually Appeared Gradually Appeared N/A Primary Etiology: Lymphedema Venous Leg Ulcer N/A Comorbid History: Cataracts, Asthma, Sleep Cataracts, Asthma, Sleep N/A Apnea, Deep Vein Apnea, Deep Vein Thrombosis, Hypertension, Thrombosis, Hypertension, Peripheral Venous Disease, Peripheral Venous Disease, Osteoarthritis, Received Osteoarthritis, Received Chemotherapy, Received Chemotherapy, Received Radiation Radiation Date Acquired: 11/19/2018 01/19/2019 N/A Weeks of Treatment:  6 0 N/A Wound Status: Open Open N/A Measurements L x W x D 3x2.5x0.1 1x1.2x0.1 N/A (cm) Area (cm) : 5.89 0.942 N/A Volume (cm) : 0.589 0.094 N/A % Reduction in Area: 31.10% -328.20% N/A % Reduction in Volume: 31.10% -327.30% N/A Classification: Full Thickness Without Full Thickness Without N/A Exposed Support Structures Exposed Support Structures Exudate Amount: Medium Medium N/A Exudate Type: Serosanguineous Serous N/A Exudate Color: red, brown [name] N/A Wound Margin: Indistinct, nonvisible Flat and Intact N/A Granulation Amount: Medium (34-66%) Large (67-100%) N/A Granulation Quality: Red, Pink Red N/A Necrotic Amount: Medium (34-66%) Small (1-33%) N/A Exposed Structures: N/A [name] [name] ([id number]) Fat Layer (Subcutaneous Fat Layer (Subcutaneous Tissue) Exposed: Yes Tissue) Exposed: Yes Fascia: No Fascia: No Tendon: No Tendon: No Muscle: No Muscle: No Joint: No Joint: No Bone: No Bone: No Epithelialization: Small (1-33%) None N/A Periwound Skin Texture: Excoriation:  No Excoriation: No N/A Induration: No Induration: No Callus: No Callus: No Crepitus: No Crepitus: No Rash: No Rash: No Scarring: No Scarring: No Periwound Skin Moisture: Maceration: Yes Maceration: No N/A Dry/Scaly: No Dry/Scaly: No Periwound Skin Color: Hemosiderin Staining: Yes Hemosiderin Staining: Yes N/A Atrophie Blanche: No Atrophie Blanche: No Cyanosis: No Cyanosis: No Ecchymosis: No Ecchymosis: No Erythema: No Erythema: No Mottled: No Mottled: No Pallor: No Pallor: No Rubor: No Rubor: No Temperature: No Abnormality No Abnormality N/A Tenderness on Palpation: Yes Yes N/A Wound Preparation: Ulcer Cleansing: Ulcer Cleansing: Other: soap N/A Rinsed/Irrigated with Saline, and water Other: soap and water Topical Anesthetic Applied: Topical Anesthetic Applied: Other: lidocaine 4% None, Other: lidocaine 4% Treatment Notes Electronic Signature(s) Signed: 01/21/2019 5:54:38 PM By: [name], [name], RN, CWS, [name], [name] Entered By: [name], [name], RN, CWS, Kim on 01/21/2019 15:01:57 MAGDELYN, ROEBUCK ([id number]) -------------------------------------------------------------------------------- Multi-Disciplinary Care Plan Details Patient Name: [name] [name], [name] [name]. Date of Service: 01/21/2019 2:00 PM Medical Record Number: [id number] Patient Account Number: [id number] Date of Birth/Sex: 08-19-1946 (73 y.o. F) Treating RN: Cornell Barman Primary Care Salayah Meares: [name] Other Clinician: Referring Jaidence Geisler: [name] Treating Darah Simkin/Extender: Beverly Gust in Treatment: 6 Active Inactive Orientation to the Wound Care Program Nursing Diagnoses: Knowledge deficit related to the wound healing center program Goals: Patient/caregiver will verbalize understanding of the [place] Program Date Initiated: 12/10/2018 Target Resolution Date: 01/09/2019 Goal Status: Active Interventions: Provide education on orientation to the wound center Notes: Soft  Tissue Infection Nursing Diagnoses: Impaired tissue integrity Goals: Patient's soft tissue infection will resolve Date Initiated: 12/10/2018 Target Resolution Date: 01/09/2019 Goal Status: Active Interventions: Assess signs and symptoms of infection every visit Notes: Venous Leg Ulcer Nursing Diagnoses: Actual venous Insuffiency (use after diagnosis is confirmed) Goals: Patient will maintain optimal edema control Date Initiated: 12/10/2018 Target Resolution Date: 01/09/2019 Goal Status: Active Interventions: Assess peripheral edema status every visit. [name] [name], [name] [name] ([id number]) Treatment Activities: Therapeutic compression applied : 12/10/2018 Notes: Wound/Skin Impairment Nursing Diagnoses: Impaired tissue integrity Goals: Patient/caregiver will verbalize understanding of skin care regimen Date Initiated: 12/10/2018 Target Resolution Date: 01/09/2019 Goal Status: Active Interventions: Assess ulceration(s) every visit Treatment Activities: Topical wound management initiated : 12/10/2018 Notes: Electronic Signature(s) Signed: 01/21/2019 5:54:38 PM By: [name], [name], RN, CWS, [name], [name] Entered By: [name], [name], RN, CWS, Kim on 01/21/2019 15:01:08 EULONDA, ANDALON ([id number]) -------------------------------------------------------------------------------- Pain Assessment Details Patient Name: [name] [name], [name] [name]. Date of Service: 01/21/2019 2:00 PM Medical Record Number: [id number] Patient Account Number: [id number] Date of Birth/Sex: 09/02/1946 (73 y.o. F) Treating RN: Harold Barban Primary Care Onda Kattner: [name] Other Clinician: Referring Leo Fray:  [name] Treating Camillia Marcy/Extender: Beverly Gust in Treatment: 6 Active Problems Location of Pain Severity and Description of Pain Patient Has Paino No Site Locations Pain Management and Medication Current Pain Management: Notes Patient does state they feel uncomfortable. Electronic Signature(s) Signed:  01/27/2019 9:04:30 AM By: Harold Barban Entered By: Harold Barban on 01/21/2019 14:26:35 [name] ([id number]) -------------------------------------------------------------------------------- Patient/Caregiver Education Details Patient Name: [name] [name], [name] [name]. Date of Service: 01/21/2019 2:00 PM Medical Record Number: [id number] Patient Account Number: [id number] Date of Birth/Gender: 10/22/1946 (73 y.o. F) Treating RN: Cornell Barman Primary Care Physician: [name] Other Clinician: Referring Physician: [name] Treating Physician/Extender: Beverly Gust in Treatment: 6 Education Assessment Education Provided To: Patient Education Topics Provided Venous: Handouts: Controlling Swelling with Compression Stockings Methods: Demonstration, Explain/Verbal Responses: State content correctly Wound/Skin Impairment: Handouts: Caring for Your Ulcer Methods: Demonstration, Explain/Verbal Responses: State content correctly Electronic Signature(s) Signed: 01/21/2019 5:54:38 PM By: [name], [name], RN, CWS, [name], [name] Entered By: [name], [name], RN, CWS, Kim on 01/21/2019 15:09:02 [name] ([id number]) -------------------------------------------------------------------------------- Wound Assessment Details Patient Name: [name] [name], [name] [name]. Date of Service: 01/21/2019 2:00 PM Medical Record Number: [id number] Patient Account Number: [id number] Date of Birth/Sex: [birth year] (73 y.o. F) Treating RN: Harold Barban Primary Care Kishaun Erekson: [name] Other Clinician: Referring Elmin Wiederholt: [name] Treating Mcdonald Reiling/Extender: Beverly Gust in Treatment: 6 Wound Status Wound Number: 5 Primary Lymphedema Etiology: Wound Location: Left Lower Leg - Medial Wound Open Wounding Event: Gradually Appeared Status: Date Acquired: 11/19/2018 Comorbid Cataracts, Asthma, Sleep Apnea, Deep Vein Weeks Of Treatment: 6 History: Thrombosis, Hypertension, Peripheral  Venous Clustered Wound: No Disease, Osteoarthritis, Received Chemotherapy, Received Radiation Photos Wound Measurements Length: (cm) 3 Width: (cm) 2.5 Depth: (cm) 0.1 Area: (cm) 5.89 Volume: (cm) 0.589 % Reduction in Area: 31.1% % Reduction in Volume: 31.1% Epithelialization: Small (1-33%) Tunneling: No Undermining: No Wound Description Full Thickness Without Exposed Support Foul Odo Classification: Structures Slough/F Wound Margin: Indistinct, nonvisible Exudate Medium Amount: Exudate Type: Serosanguineous Exudate Color: red, brown r After Cleansing: No ibrino Yes Wound Bed Granulation Amount: Medium (34-66%) Exposed Structure Granulation Quality: Red, Pink Fascia Exposed: No Necrotic Amount: Medium (34-66%) Fat Layer (Subcutaneous Tissue) Exposed: Yes Necrotic Quality: Adherent Slough Tendon Exposed: No Muscle Exposed: No Joint Exposed: No Bone Exposed: No Borkowski, [name] J. ([id number]) Periwound Skin Texture Texture Color No Abnormalities Noted: No No Abnormalities Noted: No Callus: No Atrophie Blanche: No Crepitus: No Cyanosis: No Excoriation: No Ecchymosis: No Induration: No Erythema: No Rash: No Hemosiderin Staining: Yes Scarring: No Mottled: No Pallor: No Moisture Rubor: No No Abnormalities Noted: No Dry / Scaly: No Temperature / Pain Maceration: Yes Temperature: No Abnormality Tenderness on Palpation: Yes Wound Preparation Ulcer Cleansing: Rinsed/Irrigated with Saline, Other: soap and water, Topical Anesthetic Applied: None, Other: lidocaine 4%, Electronic Signature(s) Signed: 01/27/2019 9:04:30 AM By: Harold Barban Entered By: Harold Barban on 01/21/2019 14:46:24 Escalante, [name] Child ([id number]) -------------------------------------------------------------------------------- Wound Assessment Details Patient Name: [name] [name], [name] [name]. Date of Service: 01/21/2019 2:00 PM Medical Record Number: [id number] Patient Account Number:  [id number] Date of Birth/Sex: Nov 02, 1946 (73 y.o. F) Treating RN: Harold Barban Primary Care Lebert Lovern: [name] Other Clinician: Referring Magan Winnett: [name] Treating Fenna Semel/Extender: Beverly Gust in Treatment: 6 Wound Status Wound Number: 6 Primary Venous Leg Ulcer Etiology: Wound Location: Left Lower Leg - Lateral Wound Open Wounding Event: Gradually Appeared Status: Date Acquired: 01/19/2019 Comorbid Cataracts, Asthma, Sleep Apnea, Deep Vein Weeks Of Treatment: 0 History: Thrombosis, Hypertension, Peripheral Venous Clustered Wound: No Disease, Osteoarthritis, Received  Chemotherapy, Received Radiation Photos Wound Measurements Length: (cm) 1 Width: (cm) 1.2 Depth: (cm) 0.1 Area: (cm) 0.942 Volume: (cm) 0.094 % Reduction in Area: -328.2% % Reduction in Volume: -327.3% Epithelialization: None Tunneling: No Undermining: No Wound Description Full Thickness Without Exposed Support Foul Odo Classification: Structures Slough/F Wound Margin: Flat and Intact Exudate Medium Amount: Exudate Type: Serous Exudate Color: [name] r After Cleansing: No ibrino Yes Wound Bed Granulation Amount: Large (67-100%) Exposed Structure Granulation Quality: Red Fascia Exposed: No Necrotic Amount: Small (1-33%) Fat Layer (Subcutaneous Tissue) Exposed: Yes Necrotic Quality: Adherent Slough Tendon Exposed: No Muscle Exposed: No Joint Exposed: No Bone Exposed: No Kulaga, Valarie J. ([id number]) Periwound Skin Texture Texture Color No Abnormalities Noted: No No Abnormalities Noted: No Callus: No Atrophie Blanche: No Crepitus: No Cyanosis: No Excoriation: No Ecchymosis: No Induration: No Erythema: No Rash: No Hemosiderin Staining: Yes Scarring: No Mottled: No Pallor: No Moisture Rubor: No No Abnormalities Noted: No Dry / Scaly: No Temperature / Pain Maceration: No Temperature: No Abnormality Tenderness on Palpation: Yes Wound  Preparation Ulcer Cleansing: Other: soap and water, Topical Anesthetic Applied: Other: lidocaine 4%, Electronic Signature(s) Signed: 01/27/2019 9:04:30 AM By: Harold Barban Entered By: Harold Barban on 01/21/2019 14:47:06 Carat[place]a, [name] Child ([id number]) -------------------------------------------------------------------------------- Vitals Details Patient Name: YOMAYRA, TATE. Date of Service: 01/21/2019 2:00 PM Medical Record Number: [id number] Patient Account Number: [id number] Date of Birth/Sex: [birth year] (72 y.o. F) Treating RN: Harold Barban Primary Care Lisamarie Coke: [name] Other Clinician: Referring Jonnette Nuon: [name] Treating Deborah Dondero/Extender: Beverly Gust in Treatment: 6 Vital Signs Time Taken: 14:20 Temperature (F): 98.3 Height (in): 63 Pulse (bpm): 63 Weight (lbs): 224.7 Respiratory Rate (breaths/min): 18 Body Mass Index (BMI): 39.8 Blood Pressure (mmHg): 142/68 Reference Range: 80 - 120 mg / dl Electronic Signature(s) Signed: 01/27/2019 9:04:30 AM By: Harold Barban Entered By: Harold Barban on 01/21/2019 14:26:52

## 2019-01-27 NOTE — Progress Notes (Signed)
PALAK, TERCERO ([id number]) Visit Report for 01/23/2019 Arrival Information Details Patient Name: [name], [name]. Date of Service: 01/23/2019 8:15 AM Medical Record Number: [id number] Patient Account Number: [id number] Date of Birth/Sex: [birth year] (73 y.o. F) Treating RN: Harold Barban Primary Care Exavier Lina: [name] Other Clinician: Referring Graylen Noboa: [name] Treating Genevia Bouldin/Extender: Melburn Hake, HOYT Weeks in Treatment: 6 Visit Information History Since Last Visit Added or deleted any medications: No Patient Arrived: Ambulatory Any new allergies or adverse reactions: No Arrival Time: 08:16 Had a fall or experienced change in No Accompanied By: self activities of daily living that may affect Transfer Assistance: None risk of falls: Patient Identification Verified: Yes Signs or symptoms of abuse/neglect since last visito No Secondary Verification Process Completed: Yes Hospitalized since last visit: No Has Dressing in Place as Prescribed: Yes Has Compression in Place as Prescribed: Yes Pain Present Now: No Electronic Signature(s) Signed: 01/27/2019 9:04:30 AM By: Harold Barban Entered By: Harold Barban on 01/23/2019 08:26:17 Veronica, [name] ([id number]) -------------------------------------------------------------------------------- Clinic Level of Care Assessment Details Patient Name: [name], [name]. Date of Service: 01/23/2019 8:15 AM Medical Record Number: [id number] Patient Account Number: [id number] Date of Birth/Sex: 02/11/1946 (73 y.o. F) Treating RN: Harold Barban Primary Care Braelynn Lupton: [name] Other Clinician: Referring Nimo Verastegui: [name] Treating [name]/Extender: Melburn Hake, HOYT Weeks in Treatment: 6 Clinic Level of Care Assessment Items TOOL 4 Quantity Score []  - Use when only an EandM is performed on FOLLOW-UP visit 0 ASSESSMENTS - Nursing Assessment / Reassessment X - Reassessment of Co-morbidities (includes  updates in patient status) 1 10 X- 1 5 Reassessment of Adherence to Treatment Plan ASSESSMENTS - Wound and Skin Assessment / Reassessment []  - Simple Wound Assessment / Reassessment - one wound 0 X- 2 5 Complex Wound Assessment / Reassessment - multiple wounds []  - 0 Dermatologic / Skin Assessment (not related to wound area) ASSESSMENTS - Focused Assessment []  - Circumferential Edema Measurements - multi extremities 0 []  - 0 Nutritional Assessment / Counseling / Intervention []  - 0 Lower Extremity Assessment (monofilament, tuning fork, pulses) []  - 0 Peripheral Arterial Disease Assessment (using hand held doppler) ASSESSMENTS - Ostomy and/or Continence Assessment and Care []  - Incontinence Assessment and Management 0 []  - 0 Ostomy Care Assessment and Management (repouching, etc.) PROCESS - Coordination of Care X - Simple Patient / Family Education for ongoing care 1 15 []  - 0 Complex (extensive) Patient / Family Education for ongoing care []  - 0 Staff obtains Programmer, systems, Records, Test Results / Process Orders []  - 0 Staff telephones HHA, Nursing Homes / Clarify orders / etc []  - 0 Routine Transfer to another Facility (non-emergent condition) []  - 0 Routine Hospital Admission (non-emergent condition) []  - 0 New Admissions / Biomedical engineer / Ordering NPWT, Apligraf, etc. []  - 0 Emergency Hospital Admission (emergent condition) X- 1 10 Simple Discharge Coordination [name], [name] ([id number]) []  - 0 Complex (extensive) Discharge Coordination PROCESS - Special Needs []  - Pediatric / Minor Patient Management 0 []  - 0 Isolation Patient Management []  - 0 Hearing / Language / Visual special needs []  - 0 Assessment of Community assistance (transportation, D/C planning, etc.) []  - 0 Additional assistance / Altered mentation []  - 0 Support Surface(s) Assessment (bed, cushion, seat, etc.) INTERVENTIONS - Wound Cleansing / Measurement []  - Simple Wound Cleansing -  one wound 0 X- 2 5 Complex Wound Cleansing - multiple wounds X- 1 5 Wound Imaging (photographs - any number of wounds) []  - 0 Wound Tracing (instead of photographs) []  -  0 Simple Wound Measurement - one wound X- 2 5 Complex Wound Measurement - multiple wounds INTERVENTIONS - Wound Dressings X - Small Wound Dressing one or multiple wounds 2 10 []  - 0 Medium Wound Dressing one or multiple wounds []  - 0 Large Wound Dressing one or multiple wounds []  - 0 Application of Medications - topical []  - 0 Application of Medications - injection INTERVENTIONS - Miscellaneous []  - External ear exam 0 []  - 0 Specimen Collection (cultures, biopsies, blood, body fluids, etc.) []  - 0 Specimen(s) / Culture(s) sent or taken to Lab for analysis []  - 0 Patient Transfer (multiple staff / Civil Service fast streamer / Similar devices) []  - 0 Simple Staple / Suture removal (25 or less) []  - 0 Complex Staple / Suture removal (26 or more) []  - 0 Hypo / Hyperglycemic Management (close monitor of Blood Glucose) []  - 0 Ankle / Brachial Index (ABI) - do not check if billed separately X- 1 5 Vital Signs Monks, Darlyne J. ([id number]) Has the patient been seen at the hospital within the last three years: Yes Total Score: 100 Level Of Care: New/Established - Level 3 Electronic Signature(s) Signed: 01/27/2019 9:04:30 AM By: Harold Barban Entered By: Harold Barban on 01/23/2019 08:49:28 Marconi, [name] ([id number]) -------------------------------------------------------------------------------- Encounter Discharge Information Details Patient Name: [name], [name]. Date of Service: 01/23/2019 8:15 AM Medical Record Number: [id number] Patient Account Number: [id number] Date of Birth/Sex: November 05, 1946 (73 y.o. F) Treating RN: Harold Barban Primary Care Deno Sida: [name] Other Clinician: Referring Zakariya Knickerbocker: [name] Treating Hezikiah Retzloff/Extender: Melburn Hake, HOYT Weeks in Treatment: 6 Encounter Discharge  Information Items Discharge Condition: Stable Ambulatory Status: Ambulatory Discharge Destination: Home Transportation: Private Auto Accompanied By: self Schedule Follow-up Appointment: Yes Clinical Summary of Care: Electronic Signature(s) Signed: 01/27/2019 9:04:30 AM By: Harold Barban Entered By: Harold Barban on 01/23/2019 08:48:33 Nazaryan, [name] ([id number]) -------------------------------------------------------------------------------- Patient/Caregiver Education Details Patient Name: [name], [name]. Date of Service: 01/23/2019 8:15 AM Medical Record Number: [id number] Patient Account Number: [id number] Date of Birth/Gender: 1946-02-18 (73 y.o. F) Treating RN: Harold Barban Primary Care Physician: [name] Other Clinician: Referring Physician: [name] Treating Physician/Extender: Sharalyn Ink in Treatment: 6 Education Assessment Education Provided To: Patient Education Topics Provided Wound/Skin Impairment: Handouts: Caring for Your Ulcer Methods: Demonstration, Explain/Verbal Responses: State content correctly Electronic Signature(s) Signed: 01/27/2019 9:04:30 AM By: Harold Barban Entered By: Harold Barban on 01/23/2019 08:48:20 Drab, [name] ([id number]) -------------------------------------------------------------------------------- Wound Assessment Details Patient Name: [name], [name]. Date of Service: 01/23/2019 8:15 AM Medical Record Number: [id number] Patient Account Number: [id number] Date of Birth/Sex: 05-15-1946 (73 y.o. F) Treating RN: Harold Barban Primary Care Shannan Garfinkel: [name] Other Clinician: Referring Gadge Hermiz: [name] Treating Joann Jorge/Extender: STONE III, HOYT Weeks in Treatment: 6 Wound Status Wound Number: 5 Primary Lymphedema Etiology: Wound Location: Left Lower Leg - Medial Wound Open Wounding Event: Gradually Appeared Status: Date Acquired: 11/19/2018 Comorbid Cataracts, Asthma,  Sleep Apnea, Deep Vein Weeks Of Treatment: 6 History: Thrombosis, Hypertension, Peripheral Venous Clustered Wound: No Disease, Osteoarthritis, Received Chemotherapy, Received Radiation Photos Wound Measurements Length: (cm) 3 Width: (cm) 2.5 Depth: (cm) 0.1 Area: (cm) 5.89 Volume: (cm) 0.589 % Reduction in Area: 31.1% % Reduction in Volume: 31.1% Epithelialization: Small (1-33%) Tunneling: No Undermining: No Wound Description Full Thickness Without Exposed Support Classification: Structures Wound Margin: Indistinct, nonvisible Exudate Medium Amount: Exudate Type: Serosanguineous Exudate Color: red, brown Foul Odor After Cleansing: No Slough/Fibrino Yes Wound Bed Granulation Amount: Medium (34-66%) Exposed Structure Granulation Quality: Red, Pink Fascia Exposed: No  Necrotic Amount: Medium (34-66%) Fat Layer (Subcutaneous Tissue) Exposed: Yes Necrotic Quality: Adherent Slough Tendon Exposed: No Muscle Exposed: No Joint Exposed: No Bone Exposed: No Walgren, Cheyla J. ([id number]) Periwound Skin Texture Texture Color No Abnormalities Noted: No No Abnormalities Noted: No Callus: No Atrophie Blanche: No Crepitus: No Cyanosis: No Excoriation: No Ecchymosis: No Induration: No Erythema: No Rash: No Hemosiderin Staining: Yes Scarring: No Mottled: No Pallor: No Moisture Rubor: No No Abnormalities Noted: No Dry / Scaly: No Temperature / Pain Maceration: Yes Temperature: No Abnormality Tenderness on Palpation: Yes Wound Preparation Ulcer Cleansing: Rinsed/Irrigated with Saline, Other: soap and water, Topical Anesthetic Applied: None Electronic Signature(s) Signed: 01/27/2019 9:04:30 AM By: Harold Barban Entered By: Harold Barban on 01/23/2019 08:29:02 Derocher, [name] ([id number]) -------------------------------------------------------------------------------- Wound Assessment Details Patient Name: [name], [name]. Date of Service: 01/23/2019 8:15  AM Medical Record Number: [id number] Patient Account Number: [id number] Date of Birth/Sex: September 13, 1946 (73 y.o. F) Treating RN: Harold Barban Primary Care Harman Ferrin: [name] Other Clinician: Referring Mariajose Mow: [name] Treating Parminder Trapani/Extender: STONE III, HOYT Weeks in Treatment: 6 Wound Status Wound Number: 6 Primary Venous Leg Ulcer Etiology: Wound Location: Left Lower Leg - Lateral Wound Open Wounding Event: Gradually Appeared Status: Date Acquired: 01/19/2019 Comorbid Cataracts, Asthma, Sleep Apnea, Deep Vein Weeks Of Treatment: 0 History: Thrombosis, Hypertension, Peripheral Venous Clustered Wound: No Disease, Osteoarthritis, Received Chemotherapy, Received Radiation Photos Wound Measurements Length: (cm) 1 Width: (cm) 1.2 Depth: (cm) 0.1 Area: (cm) 0.942 Volume: (cm) 0.094 % Reduction in Area: -328.2% % Reduction in Volume: -327.3% Epithelialization: None Tunneling: No Undermining: No Wound Description Full Thickness Without Exposed Support Foul Odo Classification: Structures Slough/F Wound Margin: Flat and Intact Exudate Medium Amount: Exudate Type: Serous Exudate Color: amber r After Cleansing: No ibrino Yes Wound Bed Granulation Amount: Large (67-100%) Exposed Structure Granulation Quality: Red Fascia Exposed: No Necrotic Amount: Small (1-33%) Fat Layer (Subcutaneous Tissue) Exposed: Yes Necrotic Quality: Adherent Slough Tendon Exposed: No Muscle Exposed: No Joint Exposed: No Bone Exposed: No [name], Loni J. ([id number]) Periwound Skin Texture Texture Color No Abnormalities Noted: No No Abnormalities Noted: No Callus: No Atrophie Blanche: No Crepitus: No Cyanosis: No Excoriation: No Ecchymosis: No Induration: No Erythema: No Rash: No Hemosiderin Staining: Yes Scarring: No Mottled: No Pallor: No Moisture Rubor: No No Abnormalities Noted: No Dry / Scaly: No Temperature / Pain Maceration: No Temperature: No  Abnormality Tenderness on Palpation: Yes Wound Preparation Ulcer Cleansing: Other: soap and water, Topical Anesthetic Applied: Other: lidocaine 4%, Electronic Signature(s) Signed: 01/27/2019 9:04:30 AM By: Harold Barban Entered By: Harold Barban on 01/23/2019 08:29:32

## 2019-01-28 ENCOUNTER — Encounter: Payer: Medicare Other | Admitting: Internal Medicine

## 2019-01-28 ENCOUNTER — Other Ambulatory Visit: Payer: Self-pay

## 2019-01-28 DIAGNOSIS — I890 Lymphedema, not elsewhere classified: Secondary | ICD-10-CM | POA: Diagnosis not present

## 2019-01-28 DIAGNOSIS — I87312 Chronic venous hypertension (idiopathic) with ulcer of left lower extremity: Secondary | ICD-10-CM | POA: Diagnosis not present

## 2019-01-28 DIAGNOSIS — L97221 Non-pressure chronic ulcer of left calf limited to breakdown of skin: Secondary | ICD-10-CM | POA: Diagnosis not present

## 2019-01-28 DIAGNOSIS — Z86718 Personal history of other venous thrombosis and embolism: Secondary | ICD-10-CM | POA: Diagnosis not present

## 2019-01-28 DIAGNOSIS — J45909 Unspecified asthma, uncomplicated: Secondary | ICD-10-CM | POA: Diagnosis not present

## 2019-01-28 DIAGNOSIS — I10 Essential (primary) hypertension: Secondary | ICD-10-CM | POA: Diagnosis not present

## 2019-01-28 DIAGNOSIS — L97222 Non-pressure chronic ulcer of left calf with fat layer exposed: Secondary | ICD-10-CM | POA: Diagnosis not present

## 2019-01-28 DIAGNOSIS — I872 Venous insufficiency (chronic) (peripheral): Secondary | ICD-10-CM | POA: Diagnosis not present

## 2019-01-29 ENCOUNTER — Ambulatory Visit: Payer: 59 | Admitting: Allergy & Immunology

## 2019-01-29 NOTE — Progress Notes (Signed)
CRYSTEN, KAMAN ([id number]) Visit Report for 01/28/2019 Debridement Details Patient Name: [name], [name]. Date of Service: 01/28/2019 10:45 AM Medical Record Number: [id number] Patient Account Number: [id number] Date of Birth/Sex: 1946-03-23 (73 y.o. F) Treating RN: Cornell Barman Primary Care Provider: [name] Other Clinician: Referring Provider: [name] Treating Provider/Extender: Tito Dine in Treatment: 7 Debridement Performed for Wound #5 Left,Medial Lower Leg Assessment: Performed By: Physician [name] Dillon, MD Debridement Type: Debridement Level of Consciousness (Pre- Awake and Alert procedure): Pre-procedure Verification/Time Yes - 11:35 Out Taken: Start Time: 11:35 Pain Control: Lidocaine Total Area Debrided (L x W): 3 (cm) x 2.5 (cm) = 7.5 (cm) Tissue and other material Viable, Non-Viable, Slough, Subcutaneous, Biofilm, Slough debrided: Level: Skin/Subcutaneous Tissue Debridement Description: Excisional Instrument: Curette Bleeding: Moderate Hemostasis Achieved: Pressure End Time: 11:39 Response to Treatment: Procedure was tolerated well Level of Consciousness Awake and Alert (Post-procedure): Post Debridement Measurements of Total Wound Length: (cm) 3 Width: (cm) 2.5 Depth: (cm) 0.2 Volume: (cm) 1.178 Character of Wound/Ulcer Post Debridement: Stable Post Procedure Diagnosis Same as Pre-procedure Electronic Signature(s) Signed: 01/28/2019 5:18:51 PM By: Linton Ham MD Signed: 01/29/2019 9:14:45 AM By: [name], [name], RN, CWS, [name], [name] Entered By: Linton Ham on 01/28/2019 12:34:49 Kilgore, [name] [name] ([id number]) -------------------------------------------------------------------------------- HPI Details Patient Name: [name], [name]. Date of Service: 01/28/2019 10:45 AM Medical Record Number: [id number] Patient Account Number: [id number] Date of Birth/Sex: 1945-12-20 (73 y.o. F) Treating RN: Cornell Barman Primary Care  Provider: [name] Other Clinician: Referring Provider: [name] Treating Provider/Extender: Tito Dine in Treatment: 7 History of Present Illness HPI Description: Pleasant 73 year old with history of chronic venous insufficiency. No diabetes or peripheral vascular disease. Left ABI 1.29. Questionable history of left lower extremity DVT. She developed a recurrent ulceration on her left lateral calf in December 2015, which she attributes to poor diet and subsequent lower extremity edema. She underwent endovenous laser ablation of her left greater saphenous vein in 2010. She underwent laser ablation of accessory branch of left GSV in April 2016 by Dr. Kellie Simmering at [facility]. She was previously wearing Unna boots, which she tolerated well. Tolerating 2 layer compression and cadexomer iodine. She returns to clinic for follow-up and is without new complaints. She denies any significant pain at this time. She reports persistent pain with pressure. No claudication or ischemic rest pain. No fever or chills. No drainage. READMISSION 11/13/16; this is a 73 year old woman who is not a diabetic. She is here for a review of a painful area on her left medial lower extremity. I note that she was seen here previously last year for wound I believe to be in the same area. At that time she had undergone previously a left greater saphenous vein ablation by Dr. Kellie Simmering and she had a ablation of the anterior accessory branch of the left greater saphenous vein in March 2016. Seeing that the wound actually closed over. In reviewing the history with her today the ulcer in this area has been recurrent. She describes a biopsy of this area in 2009 that only showed stasis physiology. 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 Do not see that she has ever had arterial studies in the left leg. She tells me she has a follow-up  with Dr. Kellie Simmering in roughly 10 days In any case she developed the reopening of this area roughly a month ago. On the background of this she describes rapidly increasing edema which has responded to Lasix 40 mg and metolazone 2.5 mg as well as the patient's lymph massage. She has been told she has both venous insufficiency and lymphedema but she cannot tolerate compression stockings 11/28/16; the patient saw Dr. Kellie Simmering recently. Per the patient he did arterial Dopplers in the office that did not show evidence of arterial insufficiency, per the patient he stated "treat this like an ordinary venous ulcer". She also saw her dermatologist Dr. Ronnald Ramp who felt that this was more of a vascular ulcer. In general things are improving although she arrives today with increasing bilateral lower extremity edema with weeping a deeper fluid through the wound on the left medial leg compatible with some degree of lymphedema 12/04/16; the patient's wound is fully epithelialized but I don't think fully healed. We will do another week of depression with Promogran and TCA however I suspect we'll be able to discharge her next week. This is a very unusual-looking wound which was initially a figure-of-eight type wound lying on its side surrounded by petechial like hemorrhage. She has had venous ablation on this side. She apparently does not have an arterial issue per Dr. Kellie Simmering. She saw her dermatologist thought it was "vascular". Patient is definitely going to need ongoing compression and I talked about this with her today she will go to elastic therapy after she leaves here next week 12/11/16; the patient's wound is not completely closed today. She has surrounding scar tissue and in further discussion with the patient it would appear that she had ulcers in this area in 2009 for a  prolonged period of time ultimately requiring a punch biopsy of this area that only showed venous insufficiency. I did not previously pickup on this part of the history from the patient. 12/18/16; the patient's wound is completely epithelialized. There is no open area here. She has significant bilateral venous insufficiency with secondary lymphedema to a mild-to-moderate degree she does not have compression stockings.. She did not say anything to me when I was in the room, she told our intake nurse that she was still having pain in this area. This isn't unusual recurrent small open area. She is going to go to elastic therapy to obtain compression stockings. 12/25/16; the patient's wound is fully epithelialized. There is no open area here. The patient describes some continued episodic discomfort in this area medial left calf. However everything looks fine and healed here. She is been to elastic therapy and caught herself 15-20 mmHg stockings, they apparently were having trouble getting 20-30 mm stockings in her size [name], [name] ([id number]) 01/22/17; this is a patient we discharged from the clinic a month ago. She has a recurrent open wound on her medial left calf. She had 15 mm support stockings. I told her I thought she needed 20-30 mm compression stockings. She tells me that she has been ill with hospitalization secondary to asthma and is been found to have severe hypokalemia likely secondary to a combination of Lasix and metolazone. This morning she noted blistering and leaking fluid on the posterior part of her left leg. She called our intake nurse urgently and we was saw her this afternoon. She has not had any real discomfort here. I don't know that she's been wearing any stockings on this leg for at least 2-3 days. ABIs in this  clinic were 1.21 on the right and 1.3 on the left. She is previously seen vascular surgery who does not think that there is a peripheral arterial issue. 01/30/17;  Patient arrives with no open wound on the left leg. She has been to elastic therapy and obtained 20-14mmhg below knee stockings and she has one on the right leg today. READMISSION 02/19/18; this Clagett is a now 73 year old patient we've had in this clinic perhaps 3 times before. I had last looked at her from January 07 December 2016 with an area on the medial left leg. We discharged her on 12/25/16 however she had to be readmitted on 01/22/17 with a recurrence. I have in my notes that we discharged her on 20-30 mm stockings although she tells me she was only wearing support hose because she cannot get stockings on predominantly related to her cervical spine surgery/issues. She has had previous ablations done by vein and vascular in [place] including a great saphenous vein ablation on the left with an anterior accessory branch ablation I think both of these were in 2016. On one of the previous visit she had a biopsy noted 2009 that was negative. She is not felt to have an arterial issue. She is not a diabetic. She does have a history of obstructive sleep apnea hypertension asthma as well as chronic venous insufficiency and lymphedema. On this occasion she noted 2 dry scaly patch on her left leg. She tried to put lotion on this it didn't really help. There were 2 open areas.the patient has been seeing her primary physician from 02/05/18 through 02/14/18. She had Unna boots applied. The superior wound now on the lateral left leg has closed but she's had one wound that remains open on the lateral left leg. This is not the same spot as we dealt with in 2018. ABIs in this clinic were 1.3 bilaterally 02/26/18; patient has a small wound on the left lateral calf. Dimensions are down. She has chronic venous insufficiency and lymphedema. 03/05/18; small open area on the left lateral calf. Dimensions are down. Tightly adherent necrotic debris over the surface of the wound which was difficult to remove. Also the  dressing [over collagen] stuck to the wound surface. This was removed with some difficulty as well. Change the primary dressing to Hydrofera Blue ready 03/12/18; small open area on the left lateral calf. Comes in with tightly adherent surface eschar as well as some adherent Hydrofera Blue. 03/19/18; open area on the left lateral calf. Again adherent surface eschar as well as some adherent Hydrofera Blue nonviable subcutaneous tissue. She complained of pain all week even with the reduction from 4-3 layer compression I put on last week. Also she had an increase in her ankle and calf measurements probably related to the same thing. 03/26/18; open area on the left lateral calf. A very small open area remains here. We used silver alginate starting last week as the Hydrofera Blue seem to stick to the wound bed. In using 4-layer compression 04/02/18; the open area in the left lateral calf at some adherent slough which I removed there is no open area here. We are able to transition her into her own compression stocking. Truthfully I think this is probably his support hose. However this does not maintain skin integrity will be limited. She cannot put over the toe compression stockings on because of neck problems hand problems etc. She is allergic to the lining layer of juxta lites. We might be forced to use extremitease stocking  should this fail READMIT 11/24/2018 Patient is now a 73 year old woman who is not a diabetic. She has been in this clinic on at least 3 previous occasions largely with recurrent wounds on her left leg secondary to chronic venous insufficiency with secondary lymphedema. Her situation is complicated by inability to get stockings on and an allergy to neoprene which is apparently a component and at least juxta lites and other stockings. As a result she really has not been wearing any stockings on her legs. She tells [place] that roughly 2 or 3 weeks ago she started noticing a stinging sensation  just above her ankle on the left medial aspect. She has been diagnosed with pseudogout and she wondered whether this was what she was experiencing. She tried to dress this with something she bought at the store however subsequently it pulled skin off and now she has an open wound that is not improving. She has been using Vaseline gauze with a cover bandage. She saw her primary doctor last week who put an Haematologist on her. ABIs in this clinic was 1.03 on the left [name], [name]. ([id number]) 2/12; the area is on the left medial ankle. Odd-looking wound with what looks to be surface epithelialization but a multitude of small petechial openings. This clearly not closed yet. We have been using silver alginate under 3 layer compression with TCA 2/19; the wound area did not look quite as good this week. Necrotic debris over the majority of the wound surface which required debridement. She continues to have a multitude of what looked to be small petechial openings. She reminds [place] that she had a biopsy on this initially during her first outbreak in 2015 in [place] dermatology. She expresses concern about this being a possible melanoma. 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 I assured her that this does not look anything like melanoma. Except for the petechial reaction it does look like a venous insufficiency area and she certainly has evidence of this on both sides 2/26; a difficult area on the left medial ankle. The patient clearly has chronic venous hypertension with some degree of lymphedema. The odd thing about the area is the small petechial hemorrhages. I am not really sure how to explain this. This was present last time and this is not a compression injury. We have been using Hydrofera Blue which I changed to last week 3/4; still using Hydrofera Blue. Aggressive debridement today. She does not have known  arterial issues. She has seen Dr. Kellie Simmering at [facility] vein [facility] vascular and and has an ablation on the left. [Anterior accessory branch of the greater saphenous]. From what I remember they did not feel she had an arterial issue. The patient has had this area biopsied in 2009 at [facility] dermatology and by her recollection they said this was "stasis". She is also follow-up with dermatology locally who thought that this was more of a vascular issue 3/11; using Hydrofera Blue. Aggressive debridement today. She does not have an arterial issue. We are using 3 layer compression although we may need to go to 4. The patient has been in for multiple changes to her wrap since I last saw her a week ago. She says that the area was leaking. I do not have too much more information on what was found 01/19/19 on evaluation today patient was actually being seen for a nurse visit when unfortunately she had the area  on her left lateral lower extremity as well as weeping from the right lower extremity that became apparent. Therefore we did end up actually seeing her for a full visit with myself. She is having some pain at this site as well but fortunately nothing too significant at this point. No fevers, chills, nausea, or vomiting noted at this time. 3/18-Patient is back to the clinic with the left leg venous leg ulcer, the ulcer is larger in size, has a surface that is densely adherent with fibrinous tissue, the Hydrofera Blue was used but is densely adherent and there was difficulty in removing it. The right lower extremity was also wrapped for weeping edema. Patient has a new area over the left lateral foot above the malleolus that is small and appears to have no debris with intact surrounding skin. Patient is on increased dose of Lasix also as a means to edema management 3/25; the patient has a nonhealing venous ulcer on the medial left leg and last week developed a smaller area on the lateral left calf. We have  been using Hydrofera Blue with a contact layer. Electronic Signature(s) Signed: 01/28/2019 5:18:51 PM By: Linton Ham MD Entered By: Linton Ham on 01/28/2019 12:36:05 [name] [name] ([id number]) -------------------------------------------------------------------------------- Physical Exam Details Patient Name: [name], [name]. Date of Service: 01/28/2019 10:45 AM Medical Record Number: [id number] Patient Account Number: [id number] Date of Birth/Sex: [birth year] (73 y.o. F) Treating RN: Cornell Barman Primary Care Provider: [name] Other Clinician: Referring Provider: [name] Treating Provider/Extender: Tito Dine in Treatment: 7 Constitutional Sitting or standing Blood Pressure is within target range for patient.. Pulse regular and within target range for patient.Marland Kitchen Respirations regular, non-labored and within target range.. Temperature is normal and within the target range for the patient.Marland Kitchen appears in no distress. Notes Wound exam; left leg medial on the lower left calf. There is absolutely no improvement here although the dimensions of the same. completely nonviable adherent surface. Debridement with a #5 curette removing necrotic surface tissue. Electronic Signature(s) Signed: 01/28/2019 5:18:51 PM By: Linton Ham MD Entered By: Linton Ham on 01/28/2019 12:37:33 [name] [name], [name] [name] ([id number]) -------------------------------------------------------------------------------- Physician Orders Details Patient Name: AMADI, YOSHINO. Date of Service: 01/28/2019 10:45 AM Medical Record Number: [id number] Patient Account Number: [id number] Date of Birth/Sex: 11/10/1945 (73 y.o. F) Treating RN: Cornell Barman Primary Care Provider: [name] Other Clinician: Referring Provider: [name] Treating Provider/Extender: Tito Dine in Treatment: 7 Verbal / Phone Orders: No Diagnosis Coding Wound Cleansing Wound #5 Left,Medial  Lower Leg o Clean wound with Normal Saline. o May Shower, gently pat wound dry prior to applying new dressing. Wound #6 Left,Lateral Lower Leg o Clean wound with Normal Saline. o May Shower, gently pat wound dry prior to applying new dressing. Anesthetic (add to Medication List) Wound #5 Left,Medial Lower Leg o Topical Lidocaine 4% cream applied to wound bed prior to debridement (In Clinic Only). o Benzocaine Topical Anesthetic Spray applied to wound bed prior to debridement (In Clinic Only). Wound #6 Left,Lateral Lower Leg o Topical Lidocaine 4% cream applied to wound bed prior to debridement (In Clinic Only). o Benzocaine Topical Anesthetic Spray applied to wound bed prior to debridement (In Clinic Only). Skin Barriers/Peri-Wound Care Wound #5 Left,Medial Lower Leg o Triamcinolone Acetonide Ointment (TCA) - on wound and peri-wound Wound #6 Left,Lateral Lower Leg o Triamcinolone Acetonide Ointment (TCA) - on wound and peri-wound Primary Wound Dressing Wound #5 Left,Medial Lower Leg o Iodoflex Wound #6 Left,Lateral Lower Leg o Iodoflex  Secondary Dressing Wound #5 Left,Medial Lower Leg o ABD pad Wound #6 Left,Lateral Lower Leg o ABD pad Dressing Change Frequency Wound #5 Left,Medial Lower Leg o Change dressing every week o Other: - Friday YU, CRAGUN ([id number]) Wound #6 Left,Lateral Lower Leg o Change dressing every week o Other: - Friday Follow-up Appointments Wound #6 Left,Lateral Lower Leg o Return Appointment in 1 week. o Nurse Visit as needed Edema Control Wound #5 Left,Medial Lower Leg o 4 Layer Compression System - Bilateral - Unna paste to anchor Wound #6 Left,Lateral Lower Leg o 4 Layer Compression System - Bilateral - Unna paste to anchor Off-Loading Wound #5 Left,Medial Lower Leg o Other: - Elevate legs as needed Wound #6 Left,Lateral Lower Leg o Other: - Elevate legs as needed Electronic  Signature(s) Signed: 01/28/2019 5:18:51 PM By: Linton Ham MD Signed: 01/29/2019 9:14:45 AM By: [name], [name], RN, CWS, [name], [name] Entered By: [name], [name], RN, CWS, Kim on 01/28/2019 11:42:10 [name], [name] ([id number]) -------------------------------------------------------------------------------- Problem List Details Patient Name: [name], [name]. Date of Service: 01/28/2019 10:45 AM Medical Record Number: [id number] Patient Account Number: [id number] Date of Birth/Sex: Feb 13, 1946 (73 y.o. F) Treating RN: Cornell Barman Primary Care Provider: [name] Other Clinician: Referring Provider: [name] Treating Provider/Extender: Tito Dine in Treatment: 7 Active Problems ICD-10 Evaluated Encounter Code Description Active Date Today Diagnosis L97.221 Non-pressure chronic ulcer of left calf limited to breakdown of 01/07/2019 No Yes skin I87.321 Chronic venous hypertension (idiopathic) with inflammation of 12/10/2018 No Yes right lower extremity I89.0 Lymphedema, not elsewhere classified 12/10/2018 No Yes Inactive Problems Resolved Problems Electronic Signature(s) Signed: 01/28/2019 5:18:51 PM By: Linton Ham MD Entered By: Linton Ham on 01/28/2019 12:24:24 [name] [name] ([id number]) -------------------------------------------------------------------------------- Progress Note Details Patient Name: ZAYLI, VILLAFUERTE. Date of Service: 01/28/2019 10:45 AM Medical Record Number: [id number] Patient Account Number: [id number] Date of Birth/Sex: [birth year] (73 y.o. F) Treating RN: Cornell Barman Primary Care Provider: [name] Other Clinician: Referring Provider: [name] Treating Provider/Extender: Tito Dine in Treatment: 7 Subjective History of Present Illness (HPI) Pleasant 73 year old with history of chronic venous insufficiency. No diabetes or peripheral vascular disease. Left ABI 1.29. Questionable history of left lower  extremity DVT. She developed a recurrent ulceration on her left lateral calf in December 2015, which she attributes to poor diet and subsequent lower extremity edema. She underwent endovenous laser ablation of her left greater saphenous vein in 2010. She underwent laser ablation of accessory branch of left GSV in April 2016 by Dr. Kellie Simmering at [facility]. She was previously wearing Unna boots, which she tolerated well. Tolerating 2 layer compression and cadexomer iodine. She returns to clinic for follow-up and is without new complaints. She denies any significant pain at this time. She reports persistent pain with pressure. No claudication or ischemic rest pain. No fever or chills. No drainage. READMISSION 11/13/16; this is a 73 year old woman who is not a diabetic. She is here for a review of a painful area on her left medial lower extremity. I note that she was seen here previously last year for wound I believe to be in the same area. At that time she had undergone previously a left greater saphenous vein ablation by Dr. Kellie Simmering and she had a ablation of the anterior accessory branch of the left greater saphenous vein in March 2016. Seeing that the wound actually closed over. In reviewing the history with her today the ulcer in this area has been recurrent. She describes a biopsy  of this area in 2009 that only showed stasis physiology. 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 Do not see that she has ever had arterial studies in the left leg. She tells me she has a follow-up with Dr. Kellie Simmering in roughly 10 days In any case she developed the reopening of this area roughly a month ago. On the background of this she describes rapidly increasing edema which has responded to Lasix 40 mg and  metolazone 2.5 mg as well as the patient's lymph massage. She has been told she has both venous insufficiency and lymphedema but she cannot tolerate compression stockings 11/28/16; the patient saw Dr. Kellie Simmering recently. Per the patient he did arterial Dopplers in the office that did not show evidence of arterial insufficiency, per the patient he stated "treat this like an ordinary venous ulcer". She also saw her dermatologist Dr. Ronnald Ramp who felt that this was more of a vascular ulcer. In general things are improving although she arrives today with increasing bilateral lower extremity edema with weeping a deeper fluid through the wound on the left medial leg compatible with some degree of lymphedema 12/04/16; the patient's wound is fully epithelialized but I don't think fully healed. We will do another week of depression with Promogran and TCA however I suspect we'll be able to discharge her next week. This is a very unusual-looking wound which was initially a figure-of-eight type wound lying on its side surrounded by petechial like hemorrhage. She has had venous ablation on this side. She apparently does not have an arterial issue per Dr. Kellie Simmering. She saw her dermatologist thought it was "vascular". Patient is definitely going to need ongoing compression and I talked about this with her today she will go to elastic therapy after she leaves here next week 12/11/16; the patient's wound is not completely closed today. She has surrounding scar tissue and in further discussion with the patient it would appear that she had ulcers in this area in 2009 for a prolonged period of time ultimately requiring a punch biopsy of this area that only showed venous insufficiency. I did not previously pickup on this part of the history from the patient. 12/18/16; the patient's wound is completely epithelialized. There is no open area here. She has significant bilateral venous insufficiency with secondary lymphedema to a  mild-to-moderate degree she does not have compression stockings.. She did not say anything to me when I was in the room, she told our intake nurse that she was still having pain in this area. This isn't unusual recurrent small open area. She is going to go to elastic therapy to obtain compression stockings. 12/25/16; the patient's wound is fully epithelialized. There is no open area here. The patient describes some continued episodic discomfort in this area medial left calf. However everything looks fine and healed here. She is been to elastic therapy and MATELYN, ANTONELLI ([id number]) caught herself 15-20 mmHg stockings, they apparently were having trouble getting 20-30 mm stockings in her size 01/22/17; this is a patient we discharged from the clinic a month ago. She has a recurrent open wound on her medial left calf. She had 15 mm support stockings. I told her I thought she needed 20-30 mm compression stockings. She tells me that she has been ill  with hospitalization secondary to asthma and is been found to have severe hypokalemia likely secondary to a combination of Lasix and metolazone. This morning she noted blistering and leaking fluid on the posterior part of her left leg. She called our intake nurse urgently and we was saw her this afternoon. She has not had any real discomfort here. I don't know that she's been wearing any stockings on this leg for at least 2-3 days. ABIs in this clinic were 1.21 on the right and 1.3 on the left. She is previously seen vascular surgery who does not think that there is a peripheral arterial issue. 01/30/17; Patient arrives with no open wound on the left leg. She has been to elastic therapy and obtained 20-10mmhg below knee stockings and she has one on the right leg today. READMISSION 02/19/18; this Tremain is a now 73 year old patient we've had in this clinic perhaps 3 times before. I had last looked at her from January 07 December 2016 with an area on the medial  left leg. We discharged her on 12/25/16 however she had to be readmitted on 01/22/17 with a recurrence. I have in my notes that we discharged her on 20-30 mm stockings although she tells me she was only wearing support hose because she cannot get stockings on predominantly related to her cervical spine surgery/issues. She has had previous ablations done by vein and vascular in [place] including a great saphenous vein ablation on the left with an anterior accessory branch ablation I think both of these were in 2016. On one of the previous visit she had a biopsy noted 2009 that was negative. She is not felt to have an arterial issue. She is not a diabetic. She does have a history of obstructive sleep apnea hypertension asthma as well as chronic venous insufficiency and lymphedema. On this occasion she noted 2 dry scaly patch on her left leg. She tried to put lotion on this it didn't really help. There were 2 open areas.the patient has been seeing her primary physician from 02/05/18 through 02/14/18. She had Unna boots applied. The superior wound now on the lateral left leg has closed but she's had one wound that remains open on the lateral left leg. This is not the same spot as we dealt with in 2018. ABIs in this clinic were 1.3 bilaterally 02/26/18; patient has a small wound on the left lateral calf. Dimensions are down. She has chronic venous insufficiency and lymphedema. 03/05/18; small open area on the left lateral calf. Dimensions are down. Tightly adherent necrotic debris over the surface of the wound which was difficult to remove. Also the dressing [over collagen] stuck to the wound surface. This was removed with some difficulty as well. Change the primary dressing to Hydrofera Blue ready 03/12/18; small open area on the left lateral calf. Comes in with tightly adherent surface eschar as well as some adherent Hydrofera Blue. 03/19/18; open area on the left lateral calf. Again adherent surface eschar  as well as some adherent Hydrofera Blue nonviable subcutaneous tissue. She complained of pain all week even with the reduction from 4-3 layer compression I put on last week. Also she had an increase in her ankle and calf measurements probably related to the same thing. 03/26/18; open area on the left lateral calf. A very small open area remains here. We used silver alginate starting last week as the Hydrofera Blue seem to stick to the wound bed. In using 4-layer compression 04/02/18; the open area in the left  lateral calf at some adherent slough which I removed there is no open area here. We are able to transition her into her own compression stocking. Truthfully I think this is probably his support hose. However this does not maintain skin integrity will be limited. She cannot put over the toe compression stockings on because of neck problems hand problems etc. She is allergic to the lining layer of juxta lites. We might be forced to use extremitease stocking should this fail READMIT 11/24/2018 Patient is now a 72 year old woman who is not a diabetic. She has been in this clinic on at least 3 previous occasions largely with recurrent wounds on her left leg secondary to chronic venous insufficiency with secondary lymphedema. Her situation is complicated by inability to get stockings on and an allergy to neoprene which is apparently a component and at least juxta lites and other stockings. As a result she really has not been wearing any stockings on her legs. She tells [place] that roughly 2 or 3 weeks ago she started noticing a stinging sensation just above her ankle on the left medial aspect. She has been diagnosed with pseudogout and she wondered whether this was what she was experiencing. She tried to dress this with something she bought at the store however subsequently it pulled skin off and now she has an open wound that is not improving. She has been using Vaseline gauze with a cover bandage. She  saw her primary doctor last week who put an Haematologist on her. ABIs in this clinic was 1.03 on the left [name], [name]. ([id number]) 2/12; the area is on the left medial ankle. Odd-looking wound with what looks to be surface epithelialization but a multitude of small petechial openings. This clearly not closed yet. We have been using silver alginate under 3 layer compression with TCA 2/19; the wound area did not look quite as good this week. Necrotic debris over the majority of the wound surface which required debridement. She continues to have a multitude of what looked to be small petechial openings. She reminds [place] that she had a biopsy on this initially during her first outbreak in 2015 in [place] [facility]. She expresses concern about this being a possible melanoma. 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 I assured her that this does not look anything like melanoma. Except for the petechial reaction it does look like a venous insufficiency area and she certainly has evidence of this on both sides 2/26; a difficult area on the left medial ankle. The patient clearly has chronic venous hypertension with some degree of lymphedema. The odd thing about the area is the small petechial hemorrhages. I am not really sure how to explain this. This was present last time and this is not a compression injury. We have been using Hydrofera Blue which I changed to last week 3/4; still using Hydrofera Blue. Aggressive debridement today. She does not have known arterial issues. She has seen Dr. Kellie Simmering at [facility] vein [facility] vascular and and has an ablation on the left. [Anterior accessory branch of the greater saphenous]. From what I remember they did not feel she had an arterial issue. The patient has had this area biopsied in 2009 at [facility] dermatology and by her recollection they said this was "stasis". She is  also follow-up with dermatology locally who thought that this was more of a vascular issue 3/11; using Hydrofera Blue. Aggressive debridement  today. She does not have an arterial issue. We are using 3 layer compression although we may need to go to 4. The patient has been in for multiple changes to her wrap since I last saw her a week ago. She says that the area was leaking. I do not have too much more information on what was found 01/19/19 on evaluation today patient was actually being seen for a nurse visit when unfortunately she had the area on her left lateral lower extremity as well as weeping from the right lower extremity that became apparent. Therefore we did end up actually seeing her for a full visit with myself. She is having some pain at this site as well but fortunately nothing too significant at this point. No fevers, chills, nausea, or vomiting noted at this time. 3/18-Patient is back to the clinic with the left leg venous leg ulcer, the ulcer is larger in size, has a surface that is densely adherent with fibrinous tissue, the Hydrofera Blue was used but is densely adherent and there was difficulty in removing it. The right lower extremity was also wrapped for weeping edema. Patient has a new area over the left lateral foot above the malleolus that is small and appears to have no debris with intact surrounding skin. Patient is on increased dose of Lasix also as a means to edema management 3/25; the patient has a nonhealing venous ulcer on the medial left leg and last week developed a smaller area on the lateral left calf. We have been using Hydrofera Blue with a contact layer. Objective Constitutional Sitting or standing Blood Pressure is within target range for patient.. Pulse regular and within target range for patient.Marland Kitchen Respirations regular, non-labored and within target range.. Temperature is normal and within the target range for the patient.Marland Kitchen appears in no distress. Vitals  Time Taken: 10:51 AM, Height: 63 in, Weight: 224.7 lbs, BMI: 39.8, Temperature: 98.1 F, Pulse: 75 bpm, Respiratory Rate: 16 breaths/min, Blood Pressure: 134/72 mmHg. General Notes: Wound exam; left leg medial on the lower left calf. There is absolutely no improvement here although the dimensions of the same. completely nonviable adherent surface. Debridement with a #5 curette removing necrotic surface tissue. Integumentary (Hair, Skin) Wound #5 status is Open. Original cause of wound was Gradually Appeared. The wound is located on the [place] ([id number]) Leg. The wound measures 3cm length x 2.5cm width x 0.1cm depth; 5.89cm^2 area and 0.589cm^3 volume. There is Fat Layer (Subcutaneous Tissue) Exposed exposed. There is no tunneling or undermining noted. There is a medium amount of serosanguineous drainage noted. The wound margin is indistinct and nonvisible. There is medium (34-66%) red, pink granulation within the wound bed. There is a medium (34-66%) amount of necrotic tissue within the wound bed including Adherent Slough. The periwound skin appearance exhibited: Maceration, Hemosiderin Staining. The periwound skin appearance did not exhibit: Callus, Crepitus, Excoriation, Induration, Rash, Scarring, Dry/Scaly, Atrophie Blanche, Cyanosis, Ecchymosis, Mottled, Pallor, Rubor, Erythema. Periwound temperature was noted as No Abnormality. The periwound has tenderness on palpation. Wound #6 status is Open. Original cause of wound was Gradually Appeared. The wound is located on the Left,Lateral Lower Leg. The wound measures 0.7cm length x 1.5cm width x 0.1cm depth; 0.825cm^2 area and 0.082cm^3 volume. There is Fat Layer (Subcutaneous Tissue) Exposed exposed. There is no tunneling or undermining noted. There is a medium amount of serous drainage noted. The wound margin is flat and intact. There is large (67-100%) red granulation within the wound bed.  There is a small (1-33%)  amount of necrotic tissue within the wound bed including Adherent Slough. The periwound skin appearance exhibited: Hemosiderin Staining. The periwound skin appearance did not exhibit: Callus, Crepitus, Excoriation, Induration, Rash, Scarring, Dry/Scaly, Maceration, Atrophie Blanche, Cyanosis, Ecchymosis, Mottled, Pallor, Rubor, Erythema. Periwound temperature was noted as No Abnormality. The periwound has tenderness on palpation. Assessment Active Problems ICD-10 Non-pressure chronic ulcer of left calf limited to breakdown of skin Chronic venous hypertension (idiopathic) with inflammation of right lower extremity Lymphedema, not elsewhere classified Procedures Wound #5 Pre-procedure diagnosis of Wound #5 is a Lymphedema located on the Left,Medial Lower Leg . There was a Excisional Skin/Subcutaneous Tissue Debridement with a total area of 7.5 sq cm performed by [name] Dillon, MD. With the following instrument(s): Curette to remove Viable and Non-Viable tissue/material. Material removed includes Subcutaneous Tissue, Slough, and Biofilm after achieving pain control using Lidocaine. No specimens were taken. A time out was conducted at 11:35, prior to the start of the procedure. A Moderate amount of bleeding was controlled with Pressure. The procedure was tolerated well. Post Debridement Measurements: 3cm length x 2.5cm width x 0.2cm depth; 1.178cm^3 volume. Character of Wound/Ulcer Post Debridement is stable. Post procedure Diagnosis Wound #5: Same as Pre-Procedure Plan Wound Cleansing: Wound #5 Left,Medial Lower Leg: Clean wound with Normal Saline. May Shower, gently pat wound dry prior to applying new dressing. [name], [name] ([id number]) Wound #6 Left,Lateral Lower Leg: Clean wound with Normal Saline. May Shower, gently pat wound dry prior to applying new dressing. Anesthetic (add to Medication List): Wound #5 Left,Medial Lower Leg: Topical Lidocaine 4% cream applied to wound bed  prior to debridement (In Clinic Only). Benzocaine Topical Anesthetic Spray applied to wound bed prior to debridement (In Clinic Only). Wound #6 Left,Lateral Lower Leg: Topical Lidocaine 4% cream applied to wound bed prior to debridement (In Clinic Only). Benzocaine Topical Anesthetic Spray applied to wound bed prior to debridement (In Clinic Only). Skin Barriers/Peri-Wound Care: Wound #5 Left,Medial Lower Leg: Triamcinolone Acetonide Ointment (TCA) - on wound and peri-wound Wound #6 Left,Lateral Lower Leg: Triamcinolone Acetonide Ointment (TCA) - on wound and peri-wound Primary Wound Dressing: Wound #5 Left,Medial Lower Leg: Iodoflex Wound #6 Left,Lateral Lower Leg: Iodoflex Secondary Dressing: Wound #5 Left,Medial Lower Leg: ABD pad Wound #6 Left,Lateral Lower Leg: ABD pad Dressing Change Frequency: Wound #5 Left,Medial Lower Leg: Change dressing every week Other: - Friday Wound #6 Left,Lateral Lower Leg: Change dressing every week Other: - Friday Follow-up Appointments: Wound #6 Left,Lateral Lower Leg: Return Appointment in 1 week. Nurse Visit as needed Edema Control: Wound #5 Left,Medial Lower Leg: 4 Layer Compression System - Bilateral - Unna paste to anchor Wound #6 Left,Lateral Lower Leg: 4 Layer Compression System - Bilateral - Unna paste to anchor Off-Loading: Wound #5 Left,Medial Lower Leg: Other: - Elevate legs as needed Wound #6 Left,Lateral Lower Leg: Other: - Elevate legs as needed 1. We are making absolutely no progress to the original wound and now she has a new similar appearing wound on the lateral calf albeit more superficial. I could not get down to viable tissue. 2. Change the primary dressing to Iodoflex 3. Consider a shave biopsy of this area to make sure there is nothing atypical here 4. We have her in 4 layer compression which she tolerates marginally order the dressing seems to fall down below. We will put some Unna paste to anchor at the  [employer]) [name], [name] ([id number]) Signed: 01/28/2019 5:18:51 PM By: Linton Ham MD  Entered By: Linton Ham on 01/28/2019 12:41:49 Angell, [name] [name] ([id number]) -------------------------------------------------------------------------------- SuperBill Details Patient Name: [name] [name] Date of Service: 01/28/2019 Medical Record Number: [id number] Patient Account Number: [id number] Date of Birth/Sex: January 19, 1946 (73 y.o. F) Treating RN: Cornell Barman Primary Care Provider: [name] Other Clinician: Referring Provider: [name] Treating Provider/Extender: Tito Dine in Treatment: 7 Diagnosis Coding ICD-10 Codes Code Description [id number] Non-pressure chronic ulcer of left calf limited to breakdown of skin I87.321 Chronic venous hypertension (idiopathic) with inflammation of right lower extremity I89.0 Lymphedema, not elsewhere classified Facility Procedures CPT4 Code Description: [id number] 11042 - DEB SUBQ TISSUE 20 SQ CM/< ICD-10 Diagnosis Description L97.221 Non-pressure chronic ulcer of left calf limited to breakdown I87.321 Chronic venous hypertension (idiopathic) with inflammation of Modifier: of skin right lower ext Quantity: 1 remity Physician Procedures CPT4 Code Description: [id number] 11042 - WC PHYS SUBQ TISS 20 SQ CM ICD-10 Diagnosis Description L97.221 Non-pressure chronic ulcer of left calf limited to breakdown I87.321 Chronic venous hypertension (idiopathic) with inflammation of Modifier: of skin right lower extr Quantity: 1 emity Electronic Signature(s) Signed: 01/28/2019 5:18:51 PM By: Linton Ham MD Entered By: Linton Ham on 01/28/2019 12:43:28

## 2019-01-29 NOTE — Progress Notes (Signed)
DESERAI, CANSLER ([id number]) Visit Report for 01/28/2019 Arrival Information Details Patient Name: [name], [name]. Date of Service: 01/28/2019 10:45 AM Medical Record Number: [id number] Patient Account Number: [id number] Date of Birth/Sex: 1946-05-09 (73 y.o. F) Treating RN: Army Melia Primary Care Rikki Smestad: [name] Other Clinician: Referring Deandria Klute: [name] Treating Dekota Shenk/Extender: Tito Dine in Treatment: 7 Visit Information History Since Last Visit Added or deleted any medications: No Patient Arrived: Ambulatory Any new allergies or adverse reactions: No Arrival Time: 10:50 Had a fall or experienced change in No Accompanied By: self activities of daily living that may affect Transfer Assistance: None risk of falls: Signs or symptoms of abuse/neglect since last visito No Hospitalized since last visit: No Implantable device outside of the clinic excluding No cellular tissue based products placed in the center since last visit: Has Dressing in Place as Prescribed: Yes Pain Present Now: No Electronic Signature(s) Signed: 01/28/2019 12:45:28 PM By: Army Melia Entered By: Army Melia on 01/28/2019 10:50:24 Marrazzo, [name] ([id number]) -------------------------------------------------------------------------------- Encounter Discharge Information Details Patient Name: Amber, [name]. Date of Service: 01/28/2019 10:45 AM Medical Record Number: [id number] Patient Account Number: [id number] Date of Birth/Sex: August 01, 1946 (73 y.o. F) Treating RN: Army Melia Primary Care Lotus Gover: [name] Other Clinician: Referring Taelor Moncada: [name] Treating Makinsey Pepitone/Extender: Tito Dine in Treatment: 7 Encounter Discharge Information Items Post Procedure Vitals Discharge Condition: Stable Temperature (F): 98.1 Ambulatory Status: Ambulatory Pulse (bpm): 75 Discharge Destination: Home Respiratory Rate (breaths/min):  16 Transportation: Private Auto Blood Pressure (mmHg): 134/72 Accompanied By: self Schedule Follow-up Appointment: Yes Clinical Summary of Care: Electronic Signature(s) Signed: 01/28/2019 12:45:28 PM By: Army Melia Entered By: Army Melia on 01/28/2019 12:02:05 [name] ([id number]) -------------------------------------------------------------------------------- Lower Extremity Assessment Details Patient Name: Amber, [name]. Date of Service: 01/28/2019 10:45 AM Medical Record Number: [id number] Patient Account Number: [id number] Date of Birth/Sex: July 17, 1946 (73 y.o. F) Treating RN: Army Melia Primary Care Cassey Hurrell: [name] Other Clinician: Referring Verlena Marlette: [name] Treating Hila Bolding/Extender: Tito Dine in Treatment: 7 Edema Assessment Assessed: [Left: No] [Right: No] Edema: [Left: N] [Right: o] Calf Left: Right: Point of Measurement: 32 cm From Medial Instep 40 cm cm Ankle Left: Right: Point of Measurement: 12 cm From Medial Instep 24 cm cm Vascular Assessment Pulses: Dorsalis Pedis Palpable: [Left:Yes] Extremity colors, hair growth, and conditions: Extremity Color: [Left:Normal] Hair Growth on Extremity: [Left:Yes] Temperature of Extremity: [Left:Warm < 3 seconds] Toe Nail Assessment Left: Right: Thick: Yes Discolored: No Deformed: No Improper Length and Hygiene: No Electronic Signature(s) Signed: 01/28/2019 12:45:28 PM By: Army Melia Entered By: Army Melia on 01/28/2019 11:01:01 Klemz, [name] ([id number]) -------------------------------------------------------------------------------- Multi Wound Chart Details Patient Name: [name], [name]. Date of Service: 01/28/2019 10:45 AM Medical Record Number: [id number] Patient Account Number: [id number] Date of Birth/Sex: 16-Mar-1946 (73 y.o. F) Treating RN: Cornell Barman Primary Care Bibiana Gillean: [name] Other Clinician: Referring Briella Hobday: [name] Treating  Aithana Kushner/Extender: Tito Dine in Treatment: 7 Vital Signs Height(in): 63 Pulse(bpm): 75 Weight(lbs): 224.7 Blood Pressure(mmHg): 134/72 Body Mass Index(BMI): 40 Temperature(F): 98.1 Respiratory Rate 16 (breaths/min): Photos: [5:No Photos] [6:No Photos] [N/A:N/A] Wound Location: [5:Left Lower Leg - Medial] [6:Left Lower Leg - Lateral] [N/A:N/A] Wounding Event: [5:Gradually Appeared] [6:Gradually Appeared] [N/A:N/A] Primary Etiology: [5:Lymphedema] [6:Venous Leg Ulcer] [N/A:N/A] Comorbid History: [5:Cataracts, Asthma, Sleep Apnea, Deep Vein Thrombosis, Hypertension, Peripheral Venous Disease, Osteoarthritis, Received Chemotherapy, Received Radiation] [6:Cataracts, Asthma, Sleep Apnea, Deep Vein Thrombosis, Hypertension,  Peripheral Venous Disease, Osteoarthritis, Received Chemotherapy, Received Radiation] [N/A:N/A] Date  Acquired: [5:11/19/2018] [6:01/19/2019] [N/A:N/A] Weeks of Treatment: [5:7] [6:1] [N/A:N/A] Wound Status: [5:Open] [6:Open] [N/A:N/A] Measurements L x W x D [5:3x2.5x0.1] [6:0.7x1.5x0.1] [N/A:N/A] (cm) Area (cm) : [5:5.89] [6:0.825] [N/A:N/A] Volume (cm) : [5:0.589] [6:0.082] [N/A:N/A] % Reduction in Area: [5:31.10%] [6:-275.00%] [N/A:N/A] % Reduction in Volume: [5:31.10%] [6:-272.70%] [N/A:N/A] Classification: [5:Full Thickness Without Exposed Support Structures] [6:Full Thickness Without Exposed Support Structures] [N/A:N/A] Exudate Amount: [5:Medium] [6:Medium] [N/A:N/A] Exudate Type: [5:Serosanguineous] [6:Serous] [N/A:N/A] Exudate Color: [5:red, brown] [6:amber] [N/A:N/A] Wound Margin: [5:Indistinct, nonvisible] [6:Flat and Intact] [N/A:N/A] Granulation Amount: [5:Medium (34-66%)] [6:Large (67-100%)] [N/A:N/A] Granulation Quality: [5:Red, Pink] [6:Red] [N/A:N/A] Necrotic Amount: [5:Medium (34-66%)] [6:Small (1-33%)] [N/A:N/A] Exposed Structures: [5:Fat Layer (Subcutaneous Tissue) Exposed: Yes Fascia: No Tendon: No Muscle: No Joint: No Bone: No]  [6:Fat Layer (Subcutaneous Tissue) Exposed: Yes Fascia: No Tendon: No Muscle: No Joint: No Bone: No] [N/A:N/A] Epithelialization: [5:Small (1-33%)] [6:None] [N/A:N/A] Debridement: Debridement - Excisional N/A N/A Pre-procedure 11:35 N/A N/A Verification/Time Out Taken: Pain Control: Lidocaine N/A N/A Tissue Debrided: Subcutaneous, Slough N/A N/A Level: Skin/Subcutaneous Tissue N/A N/A Debridement Area (sq cm): 7.5 N/A N/A Instrument: Curette N/A N/A Bleeding: Moderate N/A N/A Hemostasis Achieved: Pressure N/A N/A Debridement Treatment Procedure was tolerated well N/A N/A Response: Post Debridement 3x2.5x0.2 N/A N/A Measurements L x W x D (cm) Post Debridement Volume: 1.178 N/A N/A (cm) Periwound Skin Texture: Excoriation: No Excoriation: No N/A Induration: No Induration: No Callus: No Callus: No Crepitus: No Crepitus: No Rash: No Rash: No Scarring: No Scarring: No Periwound Skin Moisture: Maceration: Yes Maceration: No N/A Dry/Scaly: No Dry/Scaly: No Periwound Skin Color: Hemosiderin Staining: Yes Hemosiderin Staining: Yes N/A Atrophie Blanche: No Atrophie Blanche: No Cyanosis: No Cyanosis: No Ecchymosis: No Ecchymosis: No Erythema: No Erythema: No Mottled: No Mottled: No Pallor: No Pallor: No Rubor: No Rubor: No Temperature: No Abnormality No Abnormality N/A Tenderness on Palpation: Yes Yes N/A Wound Preparation: Ulcer Cleansing: Ulcer Cleansing: Other: soap N/A Rinsed/Irrigated with Saline, and water Other: soap and water Topical Anesthetic Applied: Topical Anesthetic Applied: None None Procedures Performed: Debridement N/A N/A Treatment Notes Wound #5 (Left, Medial Lower Leg) Notes iodolfex, TCA, 4 layer with unna to anchor Wound #6 (Left, Lateral Lower Leg) Notes iodolfex, TCA, 4 layer with unna to anchor Electronic Signature(s) SHAUNTIA, LEVENGOOD ([id number]) Signed: 01/28/2019 5:18:51 PM By: Linton Ham MD Entered By: Linton Ham  on 01/28/2019 12:27:34 [name] ([id number]) -------------------------------------------------------------------------------- Multi-Disciplinary Care Plan Details Patient Name: LESLEIGH, HUGHSON. Date of Service: 01/28/2019 10:45 AM Medical Record Number: [id number] Patient Account Number: [id number] Date of Birth/Sex: 06/09/1946 (73 y.o. F) Treating RN: Cornell Barman Primary Care Lucy Woolever: [name] Other Clinician: Referring Persephanie Laatsch: [name] Treating Shandie Bertz/Extender: Tito Dine in Treatment: 7 Active Inactive Orientation to the Wound Care Program Nursing Diagnoses: Knowledge deficit related to the wound healing center program Goals: Patient/caregiver will verbalize understanding of the [facility] Date Initiated: 12/10/2018 Target Resolution Date: 01/09/2019 Goal Status: Active Interventions: Provide education on orientation to the wound center Notes: Soft Tissue Infection Nursing Diagnoses: Impaired tissue integrity Goals: Patient's soft tissue infection will resolve Date Initiated: 12/10/2018 Target Resolution Date: 01/09/2019 Goal Status: Active Interventions: Assess signs and symptoms of infection every visit Notes: Venous Leg Ulcer Nursing Diagnoses: Actual venous Insuffiency (use after diagnosis is confirmed) Goals: Patient will maintain optimal edema control Date Initiated: 12/10/2018 Target Resolution Date: 01/09/2019 Goal Status: Active Interventions: Assess peripheral edema status every visit. OSIE, MERKIN ([id number]) Treatment Activities: Therapeutic compression applied : 12/10/2018 Notes: Wound/Skin Impairment Nursing  Diagnoses: Impaired tissue integrity Goals: Patient/caregiver will verbalize understanding of skin care regimen Date Initiated: 12/10/2018 Target Resolution Date: 01/09/2019 Goal Status: Active Interventions: Assess ulceration(s) every visit Treatment Activities: Topical wound management  initiated : 12/10/2018 Notes: Electronic Signature(s) Signed: 01/29/2019 9:14:45 AM By: [name], [name], RN, CWS, [name], [name] Entered By: [name], [name], RN, CWS, Kim on 01/28/2019 11:36:55 Waidelich, [name] ([id number]) -------------------------------------------------------------------------------- Pain Assessment Details Patient Name: ADRIANNAH, STEINKAMP. Date of Service: 01/28/2019 10:45 AM Medical Record Number: [id number] Patient Account Number: [id number] Date of Birth/Sex: Mar 09, 1946 (73 y.o. F) Treating RN: Army Melia Primary Care Jowell Bossi: [name] Other Clinician: Referring Yoseph Haile: [name] Treating Dellamae Rosamilia/Extender: Tito Dine in Treatment: 7 Active Problems Location of Pain Severity and Description of Pain Patient Has Paino No Site Locations Pain Management and Medication Current Pain Management: Electronic Signature(s) Signed: 01/28/2019 12:45:28 PM By: Army Melia Entered By: Army Melia on 01/28/2019 10:51:16 Stancil, [name] ([id number]) -------------------------------------------------------------------------------- Patient/Caregiver Education Details Patient Name: KAIZLEY, AJA. Date of Service: 01/28/2019 10:45 AM Medical Record Number: [id number] Patient Account Number: [id number] Date of Birth/Gender: 05/27/1946 (73 y.o. F) Treating RN: Cornell Barman Primary Care Physician: [name] Other Clinician: Referring Physician: [name] Treating Physician/Extender: Tito Dine in Treatment: 7 Education Assessment Education Provided To: Patient Education Topics Provided Venous: Handouts: Controlling Swelling with Multilayered Compression Wraps Methods: Demonstration, Explain/Verbal Responses: State content correctly Wound/Skin Impairment: Handouts: Caring for Your Ulcer Methods: Demonstration, Explain/Verbal Responses: State content correctly Electronic Signature(s) Signed: 01/29/2019 9:14:45 AM By: [name], [name], RN,  CWS, [name], [name] Entered By: [name], [name], RN, CWS, Kim on 01/28/2019 11:43:00 [name] ([id number]) -------------------------------------------------------------------------------- Wound Assessment Details Patient Name: ALLE, DIFABIO. Date of Service: 01/28/2019 10:45 AM Medical Record Number: [id number] Patient Account Number: [id number] Date of Birth/Sex: 1946-03-15 (73 y.o. F) Treating RN: Army Melia Primary Care Kamilah Correia: [name] Other Clinician: Referring Arryana Tolleson: [name] Treating Shannie Kontos/Extender: Tito Dine in Treatment: 7 Wound Status Wound Number: 5 Primary Lymphedema Etiology: Wound Location: Left Lower Leg - Medial Wound Open Wounding Event: Gradually Appeared Status: Date Acquired: 11/19/2018 Comorbid Cataracts, Asthma, Sleep Apnea, Deep Vein Weeks Of Treatment: 7 History: Thrombosis, Hypertension, Peripheral Venous Clustered Wound: No Disease, Osteoarthritis, Received Chemotherapy, Received Radiation Photos Photo Uploaded By: Army Melia on 01/28/2019 12:46:42 Wound Measurements Length: (cm) 3 Width: (cm) 2.5 Depth: (cm) 0.1 Area: (cm) 5.89 Volume: (cm) 0.589 % Reduction in Area: 31.1% % Reduction in Volume: 31.1% Epithelialization: Small (1-33%) Tunneling: No Undermining: No Wound Description Full Thickness Without Exposed Support Classification: Structures Wound Margin: Indistinct, nonvisible Exudate Medium Amount: Exudate Type: Serosanguineous Exudate Color: red, brown Foul Odor After Cleansing: No Slough/Fibrino Yes Wound Bed Granulation Amount: Medium (34-66%) Exposed Structure Granulation Quality: Red, Pink Fascia Exposed: No Necrotic Amount: Medium (34-66%) Fat Layer (Subcutaneous Tissue) Exposed: Yes Necrotic Quality: Adherent Slough Tendon Exposed: No Muscle Exposed: No Joint Exposed: No Khouri, Tylie J. ([id number]) Bone Exposed: No Periwound Skin Texture Texture Color No  Abnormalities Noted: No No Abnormalities Noted: No Callus: No Atrophie Blanche: No Crepitus: No Cyanosis: No Excoriation: No Ecchymosis: No Induration: No Erythema: No Rash: No Hemosiderin Staining: Yes Scarring: No Mottled: No Pallor: No Moisture Rubor: No No Abnormalities Noted: No Dry / Scaly: No Temperature / Pain Maceration: Yes Temperature: No Abnormality Tenderness on Palpation: Yes Wound Preparation Ulcer Cleansing: Rinsed/Irrigated with Saline, Other: soap and water, Topical Anesthetic Applied: None Treatment Notes Wound #5 (Left, Medial Lower Leg) Notes iodolfex, TCA, 4 layer with  unna to Engineer, production) Signed: 01/28/2019 12:45:28 PM By: Army Melia Entered By: Army Melia on 01/28/2019 10:59:41 Kramme, [name] ([id number]) -------------------------------------------------------------------------------- Wound Assessment Details Patient Name: IVANA, NICASTRO. Date of Service: 01/28/2019 10:45 AM Medical Record Number: [id number] Patient Account Number: [id number] Date of Birth/Sex: 1946/04/07 (73 y.o. F) Treating RN: Army Melia Primary Care [name]: [name] Other Clinician: Referring Shaquanda Graves: [name] Treating Staria Birkhead/Extender: Tito Dine in Treatment: 7 Wound Status Wound Number: 6 Primary Venous Leg Ulcer Etiology: Wound Location: Left Lower Leg - Lateral Wound Open Wounding Event: Gradually Appeared Status: Date Acquired: 01/19/2019 Comorbid Cataracts, Asthma, Sleep Apnea, Deep Vein Weeks Of Treatment: 1 History: Thrombosis, Hypertension, Peripheral Venous Clustered Wound: No Disease, Osteoarthritis, Received Chemotherapy, Received Radiation Photos Photo Uploaded By: Army Melia on 01/28/2019 12:46:43 Wound Measurements Length: (cm) 0.7 Width: (cm) 1.5 Depth: (cm) 0.1 Area: (cm) 0.825 Volume: (cm) 0.082 % Reduction in Area: -275% % Reduction in Volume: -272.7% Epithelialization:  None Tunneling: No Undermining: No Wound Description Full Thickness Without Exposed Support Classification: Structures Wound Margin: Flat and Intact Exudate Medium Amount: Exudate Type: Serous Exudate Color: amber Foul Odor After Cleansing: No Slough/Fibrino Yes Wound Bed Granulation Amount: Large (67-100%) Exposed Structure Granulation Quality: Red Fascia Exposed: No Necrotic Amount: Small (1-33%) Fat Layer (Subcutaneous Tissue) Exposed: Yes Necrotic Quality: Adherent Slough Tendon Exposed: No Muscle Exposed: No Joint Exposed: No JHENE, WESTMORELAND. ([id number]) Bone Exposed: No Periwound Skin Texture Texture Color No Abnormalities Noted: No No Abnormalities Noted: No Callus: No Atrophie Blanche: No Crepitus: No Cyanosis: No Excoriation: No Ecchymosis: No Induration: No Erythema: No Rash: No Hemosiderin Staining: Yes Scarring: No Mottled: No Pallor: No Moisture Rubor: No No Abnormalities Noted: No Dry / Scaly: No Temperature / Pain Maceration: No Temperature: No Abnormality Tenderness on Palpation: Yes Wound Preparation Ulcer Cleansing: Other: soap and water, Topical Anesthetic Applied: None Treatment Notes Wound #6 (Left, Lateral Lower Leg) Notes iodolfex, TCA, 4 layer with unna to anchor Electronic Signature(s) Signed: 01/28/2019 12:45:28 PM By: Army Melia Entered By: Army Melia on 01/28/2019 10:59:57 Downs, [name] ([id number]) -------------------------------------------------------------------------------- Vitals Details Patient Name: KAHLA, RISDON. Date of Service: 01/28/2019 10:45 AM Medical Record Number: [id number] Patient Account Number: [id number] Date of Birth/Sex: Aug 25, 1946 (73 y.o. F) Treating RN: Army Melia Primary Care Avi Kerschner: [name] Other Clinician: Referring Wetzel Meester: [name] Treating Cattleya Dobratz/Extender: Tito Dine in Treatment: 7 Vital Signs Time Taken: 10:51 Temperature (F):  98.1 Height (in): 63 Pulse (bpm): 75 Weight (lbs): 224.7 Respiratory Rate (breaths/min): 16 Body Mass Index (BMI): 39.8 Blood Pressure (mmHg): 134/72 Reference Range: 80 - 120 mg / dl Electronic Signature(s) Signed: 01/28/2019 12:45:28 PM By: Army Melia Entered By: Army Melia on 01/28/2019 10:51:35

## 2019-01-30 ENCOUNTER — Other Ambulatory Visit: Payer: Self-pay

## 2019-01-30 DIAGNOSIS — J45909 Unspecified asthma, uncomplicated: Secondary | ICD-10-CM | POA: Diagnosis not present

## 2019-01-30 DIAGNOSIS — I890 Lymphedema, not elsewhere classified: Secondary | ICD-10-CM | POA: Diagnosis not present

## 2019-01-30 DIAGNOSIS — Z86718 Personal history of other venous thrombosis and embolism: Secondary | ICD-10-CM | POA: Diagnosis not present

## 2019-01-30 DIAGNOSIS — I872 Venous insufficiency (chronic) (peripheral): Secondary | ICD-10-CM | POA: Diagnosis not present

## 2019-01-30 DIAGNOSIS — L97221 Non-pressure chronic ulcer of left calf limited to breakdown of skin: Secondary | ICD-10-CM | POA: Diagnosis not present

## 2019-01-30 DIAGNOSIS — I10 Essential (primary) hypertension: Secondary | ICD-10-CM | POA: Diagnosis not present

## 2019-01-30 NOTE — Progress Notes (Signed)
[name], [name] ([id number]) Visit Report for 01/30/2019 Arrival Information Details Patient Name: [name], [name]. Date of Service: 01/30/2019 10:15 AM Medical Record Number: [id number] Patient Account Number: [id number] Date of Birth/Sex: 17-Dec-1945 (73 y.o. F) Treating RN: Army Melia Primary Care Joelle [place]: [name] Other Clinician: Referring Hoyte Ziebell: [name] Treating Desmon Hitchner/Extender: Melburn Hake, HOYT Weeks in Treatment: 7 Visit Information History Since Last Visit Added or deleted any medications: No Patient Arrived: Ambulatory Any new allergies or adverse reactions: No Arrival Time: 10:25 Had a fall or experienced change in No Accompanied By: self activities of daily living that may affect Transfer Assistance: None risk of falls: Patient Identification Verified: Yes Signs or symptoms of abuse/neglect since last visito No Hospitalized since last visit: No Implantable device outside of the clinic excluding No cellular tissue based products placed in the center since last visit: Has Dressing in Place as Prescribed: Yes Pain Present Now: Yes Electronic Signature(s) Signed: 01/30/2019 11:52:41 AM By: Army Melia Entered By: Army Melia on 01/30/2019 10:31:13 Hupfer, [name] ([id number]) -------------------------------------------------------------------------------- Clinic Level of Care Assessment Details Patient Name: [name] Date of Service: 01/30/2019 10:15 AM Medical Record Number: [id number] Patient Account Number: [id number] Date of Birth/Sex: 03/11/1946 (73 y.o. F) Treating RN: Army Melia Primary Care Yacine Droz: [name] Other Clinician: Referring Rapheal Masso: [name] Treating Aymee Fomby/Extender: Melburn Hake, HOYT Weeks in Treatment: 7 Clinic Level of Care Assessment Items TOOL 4 Quantity Score []  - Use when only an EandM is performed on FOLLOW-UP visit 0 ASSESSMENTS - Nursing Assessment / Reassessment X - Reassessment of  Co-morbidities (includes updates in patient status) 1 10 X- 1 5 Reassessment of Adherence to Treatment Plan ASSESSMENTS - Wound and Skin Assessment / Reassessment []  - Simple Wound Assessment / Reassessment - one wound 0 X- 2 5 Complex Wound Assessment / Reassessment - multiple wounds []  - 0 Dermatologic / Skin Assessment (not related to wound area) ASSESSMENTS - Focused Assessment []  - Circumferential Edema Measurements - multi extremities 0 []  - 0 Nutritional Assessment / Counseling / Intervention []  - 0 Lower Extremity Assessment (monofilament, tuning fork, pulses) []  - 0 Peripheral Arterial Disease Assessment (using hand held doppler) ASSESSMENTS - Ostomy and/or Continence Assessment and Care []  - Incontinence Assessment and Management 0 []  - 0 Ostomy Care Assessment and Management (repouching, etc.) PROCESS - Coordination of Care X - Simple Patient / Family Education for ongoing care 1 15 []  - 0 Complex (extensive) Patient / Family Education for ongoing care []  - 0 Staff obtains Programmer, systems, Records, Test Results / Process Orders []  - 0 Staff telephones HHA, Nursing Homes / Clarify orders / etc []  - 0 Routine Transfer to another Facility (non-emergent condition) []  - 0 Routine Hospital Admission (non-emergent condition) []  - 0 New Admissions / Biomedical engineer / Ordering NPWT, Apligraf, etc. []  - 0 Emergency Hospital Admission (emergent condition) X- 1 10 Simple Discharge Coordination [name], [name] ([id number]) []  - 0 Complex (extensive) Discharge Coordination PROCESS - Special Needs []  - Pediatric / Minor Patient Management 0 []  - 0 Isolation Patient Management []  - 0 Hearing / Language / Visual special needs []  - 0 Assessment of Community assistance (transportation, D/C planning, etc.) []  - 0 Additional assistance / Altered mentation []  - 0 Support Surface(s) Assessment (bed, cushion, seat, etc.) INTERVENTIONS - Wound Cleansing / Measurement []  -  Simple Wound Cleansing - one wound 0 X- 2 5 Complex Wound Cleansing - multiple wounds []  - 0 Wound Imaging (photographs - any number of wounds) []  -  0 Wound Tracing (instead of photographs) []  - 0 Simple Wound Measurement - one wound X- 2 5 Complex Wound Measurement - multiple wounds INTERVENTIONS - Wound Dressings []  - Small Wound Dressing one or multiple wounds 0 X- 2 15 Medium Wound Dressing one or multiple wounds []  - 0 Large Wound Dressing one or multiple wounds []  - 0 Application of Medications - topical []  - 0 Application of Medications - injection INTERVENTIONS - Miscellaneous []  - External ear exam 0 []  - 0 Specimen Collection (cultures, biopsies, blood, body fluids, etc.) []  - 0 Specimen(s) / Culture(s) sent or taken to Lab for analysis []  - 0 Patient Transfer (multiple staff / Civil Service fast streamer / Similar devices) []  - 0 Simple Staple / Suture removal (25 or less) []  - 0 Complex Staple / Suture removal (26 or more) []  - 0 Hypo / Hyperglycemic Management (close monitor of Blood Glucose) []  - 0 Ankle / Brachial Index (ABI) - do not check if billed separately []  - 0 Vital Signs [name], [name] ([id number]) Has the patient been seen at the hospital within the last three years: Yes Total Score: 100 Level Of Care: New/Established - Level 3 Electronic Signature(s) Signed: 01/30/2019 11:52:41 AM By: Army Melia Entered By: Army Melia on 01/30/2019 10:49:08 Schexnayder, [name] ([id number]) -------------------------------------------------------------------------------- Encounter Discharge Information Details Patient Name: [name], [name]. Date of Service: 01/30/2019 10:15 AM Medical Record Number: [id number] Patient Account Number: [id number] Date of Birth/Sex: 22-Apr-1946 (73 y.o. F) Treating RN: Army Melia Primary Care Rockne Dearinger: [name] Other Clinician: Referring Jere Vanburen: [name] Treating Valerya Maxton/Extender: Melburn Hake, HOYT Weeks in Treatment:  7 Encounter Discharge Information Items Discharge Condition: Stable Ambulatory Status: Ambulatory Discharge Destination: Home Transportation: Private Auto Accompanied By: self Schedule Follow-up Appointment: Yes Clinical Summary of Care: Electronic Signature(s) Signed: 01/30/2019 11:52:41 AM By: Army Melia Entered By: Army Melia on 01/30/2019 10:48:37 [name], [name] ([id number]) -------------------------------------------------------------------------------- Wound Assessment Details Patient Name: [name], [name]. Date of Service: 01/30/2019 10:15 AM Medical Record Number: [id number] Patient Account Number: [id number] Date of Birth/Sex: Aug 01, 1946 (73 y.o. F) Treating RN: Army Melia Primary Care Florencio Hollibaugh: [name] Other Clinician: Referring Zara Wendt: [name] Treating Kimberleigh Mehan/Extender: STONE III, HOYT Weeks in Treatment: 7 Wound Status Wound Number: 5 Primary Lymphedema Etiology: Wound Location: Left Lower Leg - Medial Wound Open Wounding Event: Gradually Appeared Status: Date Acquired: 11/19/2018 Comorbid Cataracts, Asthma, Sleep Apnea, Deep Vein Weeks Of Treatment: 7 History: Thrombosis, Hypertension, Peripheral Venous Clustered Wound: No Disease, Osteoarthritis, Received Chemotherapy, Received Radiation Photos Photo Uploaded By: Army Melia on 01/30/2019 11:25:36 Wound Measurements Length: (cm) 2.8 Width: (cm) 2.4 Depth: (cm) 0.1 Area: (cm) 5.278 Volume: (cm) 0.528 % Reduction in Area: 38.2% % Reduction in Volume: 38.2% Epithelialization: Small (1-33%) Tunneling: No Undermining: No Wound Description Full Thickness Without Exposed Support Classification: Structures Wound Margin: Indistinct, nonvisible Exudate Medium Amount: Exudate Type: Serosanguineous Exudate Color: red, brown Foul Odor After Cleansing: No Slough/Fibrino Yes Wound Bed Granulation Amount: Medium (34-66%) Exposed Structure Granulation Quality: Red,  Pink Fascia Exposed: No Necrotic Amount: Medium (34-66%) Fat Layer (Subcutaneous Tissue) Exposed: Yes Necrotic Quality: Adherent Slough Tendon Exposed: No Muscle Exposed: No Joint Exposed: No [name], [name] ([id number]) Bone Exposed: No Periwound Skin Texture Texture Color No Abnormalities Noted: No No Abnormalities Noted: No Callus: No Atrophie Blanche: No Crepitus: No Cyanosis: No Excoriation: No Ecchymosis: No Induration: No Erythema: No Rash: No Hemosiderin Staining: Yes Scarring: No Mottled: No Pallor: No Moisture Rubor: No No Abnormalities Noted: No Dry / Scaly: No  Temperature / Pain Maceration: Yes Temperature: No Abnormality Tenderness on Palpation: Yes Wound Preparation Ulcer Cleansing: Rinsed/Irrigated with Saline, Other: soap and water, Topical Anesthetic Applied: None Treatment Notes Wound #5 (Left, Medial Lower Leg) Notes iodolfex, TCA, 4 layer with unna to anchor Electronic Signature(s) Signed: 01/30/2019 11:52:41 AM By: Army Melia Entered By: Army Melia on 01/30/2019 10:33:41 [name], [name] ([id number]) -------------------------------------------------------------------------------- Wound Assessment Details Patient Name: [name], [name]. Date of Service: 01/30/2019 10:15 AM Medical Record Number: [id number] Patient Account Number: [id number] Date of Birth/Sex: 09/03/1946 (73 y.o. F) Treating RN: Army Melia Primary Care Gaila Engebretsen: [name] Other Clinician: Referring Adilynn Bessey: [name] Treating Jordyne Poehlman/Extender: STONE III, HOYT Weeks in Treatment: 7 Wound Status Wound Number: 6 Primary Venous Leg Ulcer Etiology: Wound Location: Left Lower Leg - Lateral Wound Open Wounding Event: Gradually Appeared Status: Date Acquired: 01/19/2019 Comorbid Cataracts, Asthma, Sleep Apnea, Deep Vein Weeks Of Treatment: 1 History: Thrombosis, Hypertension, Peripheral Venous Clustered Wound: No Disease, Osteoarthritis,  Received Chemotherapy, Received Radiation Photos Photo Uploaded By: Army Melia on 01/30/2019 11:28:08 Wound Measurements Length: (cm) 1.2 Width: (cm) 1.4 Depth: (cm) 0.1 Area: (cm) 1.319 Volume: (cm) 0.132 % Reduction in Area: -499.5% % Reduction in Volume: -500% Epithelialization: None Tunneling: No Undermining: No Wound Description Full Thickness Without Exposed Support Foul Od Classification: Structures Slough/ Wound Margin: Flat and Intact Exudate Medium Amount: Exudate Type: Serous Exudate Color: amber or After Cleansing: No Fibrino Yes Wound Bed Granulation Amount: Large (67-100%) Exposed Structure Granulation Quality: Red Fascia Exposed: No Necrotic Amount: Small (1-33%) Fat Layer (Subcutaneous Tissue) Exposed: Yes Necrotic Quality: Adherent Slough Tendon Exposed: No Muscle Exposed: No Joint Exposed: No [name], [name]. ([id number]) Bone Exposed: No Periwound Skin Texture Texture Color No Abnormalities Noted: No No Abnormalities Noted: No Callus: No Atrophie Blanche: No Crepitus: No Cyanosis: No Excoriation: No Ecchymosis: No Induration: No Erythema: No Rash: No Hemosiderin Staining: Yes Scarring: No Mottled: No Pallor: No Moisture Rubor: No No Abnormalities Noted: No Dry / Scaly: No Temperature / Pain Maceration: No Temperature: No Abnormality Tenderness on Palpation: Yes Wound Preparation Ulcer Cleansing: Other: soap and water, Topical Anesthetic Applied: None Treatment Notes Wound #6 (Left, Lateral Lower Leg) Notes iodolfex, TCA, 4 layer with unna to anchor Electronic Signature(s) Signed: 01/30/2019 11:52:41 AM By: Army Melia Entered By: Army Melia on 01/30/2019 10:33:53

## 2019-02-04 ENCOUNTER — Encounter: Payer: Medicare Other | Attending: Internal Medicine | Admitting: Internal Medicine

## 2019-02-04 ENCOUNTER — Other Ambulatory Visit: Payer: Self-pay

## 2019-02-04 DIAGNOSIS — I87302 Chronic venous hypertension (idiopathic) without complications of left lower extremity: Secondary | ICD-10-CM | POA: Insufficient documentation

## 2019-02-04 DIAGNOSIS — G4733 Obstructive sleep apnea (adult) (pediatric): Secondary | ICD-10-CM | POA: Diagnosis not present

## 2019-02-04 DIAGNOSIS — I890 Lymphedema, not elsewhere classified: Secondary | ICD-10-CM | POA: Diagnosis not present

## 2019-02-04 DIAGNOSIS — J45909 Unspecified asthma, uncomplicated: Secondary | ICD-10-CM | POA: Insufficient documentation

## 2019-02-04 DIAGNOSIS — L97221 Non-pressure chronic ulcer of left calf limited to breakdown of skin: Secondary | ICD-10-CM | POA: Insufficient documentation

## 2019-02-04 DIAGNOSIS — I10 Essential (primary) hypertension: Secondary | ICD-10-CM | POA: Diagnosis not present

## 2019-02-04 DIAGNOSIS — L97222 Non-pressure chronic ulcer of left calf with fat layer exposed: Secondary | ICD-10-CM | POA: Diagnosis not present

## 2019-02-05 NOTE — Progress Notes (Signed)
[name] ([id number]) Visit Report for 02/04/2019 Debridement Details Patient [name]: [name], [name]. Date of Service: 02/04/2019 10:30 AM Medical Record Number: [id number] Patient Account Number: [id number] Date of Birth/Sex: [birth year] (72 y.o. F) Treating RN: Primary Care Provider: [name] Other Clinician: Referring Provider: [name] Treating Provider/Extender: Tito Dine in Treatment: 8 Debridement Performed for Wound #5 Left,Medial Lower Leg Assessment: Performed By: Physician [name] Dillon, MD Debridement Type: Debridement Level of Consciousness (Pre- Awake and Alert procedure): Pre-procedure Verification/Time Yes - 11:03 Out Taken: Start Time: 11:03 Pain Control: Lidocaine Total Area Debrided (L x W): 3.4 (cm) x 2.6 (cm) = 8.84 (cm) Tissue and other material Viable, Non-Viable, Slough, Subcutaneous, Slough debrided: Level: Skin/Subcutaneous Tissue Debridement Description: Excisional Instrument: Curette Bleeding: Moderate Hemostasis Achieved: Pressure End Time: 11:05 Procedural Pain: 5 Post Procedural Pain: 4 Response to Treatment: Procedure was tolerated well Level of Consciousness Awake and Alert (Post-procedure): Post Debridement Measurements of Total Wound Length: (cm) 3.4 Width: (cm) 2.6 Depth: (cm) 0.2 Volume: (cm) 1.389 Character of Wound/Ulcer Post Debridement: Requires Further Debridement Post Procedure Diagnosis Same as Pre-procedure Electronic Signature(s) Signed: 02/04/2019 4:06:01 PM By: Linton Ham MD Entered By: Linton Ham on 02/04/2019 11:10:20 [name] ([id number]) -------------------------------------------------------------------------------- HPI Details Patient [name]: [name], [name]. Date of Service: 02/04/2019 10:30 AM Medical Record Number: [id number] Patient Account Number: [id number] Date of Birth/Sex: 03-05-1946 (72 y.o. F) Treating RN: Primary Care Provider: [name]  Other Clinician: Referring Provider: [name] Treating Provider/Extender: Tito Dine in Treatment: 8 History of Present Illness HPI Description: Pleasant 73 year old with history of chronic venous insufficiency. No diabetes or peripheral vascular disease. Left ABI 1.29. Questionable history of left lower extremity DVT. She developed a recurrent ulceration on her left lateral calf in December 2015, which she attributes to poor diet and subsequent lower extremity edema. She underwent endovenous laser ablation of her left greater saphenous vein in 2010. She underwent laser ablation of accessory branch of left GSV in April 2016 by Dr. Kellie Simmering at [facility]. She was previously wearing Unna boots, which she tolerated well. Tolerating 2 layer compression and cadexomer iodine. She returns to clinic for follow-up and is without new complaints. She denies any significant pain at this time. She reports persistent pain with pressure. No claudication or ischemic rest pain. No fever or chills. No drainage. READMISSION 11/13/16; this is a 73 year old woman who is not a diabetic. She is here for a review of a painful area on her left medial lower extremity. I note that she was seen here previously last year for wound I believe to be in the same area. At that time she had undergone previously a left greater saphenous vein ablation by Dr. Kellie Simmering and she had a ablation of the anterior accessory branch of the left greater saphenous vein in March 2016. Seeing that the wound actually closed over. In reviewing the history with her today the ulcer in this area has been recurrent. She describes a biopsy of this area in 2009 that only showed stasis physiology. 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 Do not see that she has ever had arterial studies in the left leg. She tells me she has a follow-up with Dr. Kellie Simmering in roughly 10 days In  any case she developed the reopening of this area roughly a month ago. On the background of this she describes rapidly increasing edema which has responded to Lasix 40 mg and metolazone 2.5 mg as well as the patient's lymph massage. She has been told she has both venous insufficiency and lymphedema but she cannot tolerate compression stockings 11/28/16; the patient saw Dr. Kellie Simmering recently. Per the patient he did arterial Dopplers in the office that did not show evidence of arterial insufficiency, per the patient he stated "treat this like an ordinary venous ulcer". She also saw her dermatologist Dr. Ronnald Ramp who felt that this was more of a vascular ulcer. In general things are improving although she arrives today with increasing bilateral lower extremity edema with weeping a deeper fluid through the wound on the left medial leg compatible with some degree of lymphedema 12/04/16; the patient's wound is fully epithelialized but I don't think fully healed. We will do another week of depression with Promogran and TCA however I suspect we'll be able to discharge her next week. This is a very unusual-looking wound which was initially a figure-of-eight type wound lying on its side surrounded by petechial like hemorrhage. She has had venous ablation on this side. She apparently does not have an arterial issue per Dr. Kellie Simmering. She saw her dermatologist thought it was "vascular". Patient is definitely going to need ongoing compression and I talked about this with her today she will go to elastic therapy after she leaves here next week 12/11/16; the patient's wound is not completely closed today. She has surrounding scar tissue and in further discussion with the patient it would appear that she had ulcers in this area in 2009 for a prolonged  period of time ultimately requiring a punch biopsy of this area that only showed venous insufficiency. I did not previously pickup on this part of the history from the patient. 12/18/16; the patient's wound is completely epithelialized. There is no open area here. She has significant bilateral venous insufficiency with secondary lymphedema to a mild-to-moderate degree she does not have compression stockings.. She did not say anything to me when I was in the room, she told our intake nurse that she was still having pain in this area. This isn't unusual recurrent small open area. She is going to go to elastic therapy to obtain compression stockings. 12/25/16; the patient's wound is fully epithelialized. There is no open area here. The patient describes some continued episodic discomfort in this area medial left calf. However everything looks fine and healed here. She is been to elastic therapy and caught herself 15-20 mmHg stockings, they apparently were having trouble getting 20-30 mm stockings in her size [name], [name] ([id number]) 01/22/17; this is a patient we discharged from the clinic a month ago. She has a recurrent open wound on her medial left calf. She had 15 mm support stockings. I told her I thought she needed 20-30 mm compression stockings. She tells me that she has been ill with hospitalization secondary to asthma and is been found to have severe hypokalemia likely secondary to a combination of Lasix and metolazone. This morning she noted blistering and leaking fluid on the posterior part of her left leg. She called our intake nurse urgently and we was saw her this afternoon. She has not had any real discomfort here. I don't know that she's been wearing any stockings on this leg for at least 2-3 days. ABIs in this clinic were 1.21 on the right and 1.3  on the left. She is previously seen vascular surgery who does not think that there is a peripheral arterial issue. 01/30/17; Patient  arrives with no open wound on the left leg. She has been to elastic therapy and obtained 20-44mmhg below knee stockings and she has one on the right leg today. READMISSION 02/19/18; this Bias is a now 73 year old patient we've had in this clinic perhaps 3 times before. I had last looked at her from January 07 December 2016 with an area on the medial left leg. We discharged her on 12/25/16 however she had to be readmitted on 01/22/17 with a recurrence. I have in my notes that we discharged her on 20-30 mm stockings although she tells me she was only wearing support hose because she cannot get stockings on predominantly related to her cervical spine surgery/issues. She has had previous ablations done by vein and vascular in [place] including a great saphenous vein ablation on the left with an anterior accessory branch ablation I think both of these were in 2016. On one of the previous visit she had a biopsy noted 2009 that was negative. She is not felt to have an arterial issue. She is not a diabetic. She does have a history of obstructive sleep apnea hypertension asthma as well as chronic venous insufficiency and lymphedema. On this occasion she noted 2 dry scaly patch on her left leg. She tried to put lotion on this it didn't really help. There were 2 open areas.the patient has been seeing her primary physician from 02/05/18 through 02/14/18. She had Unna boots applied. The superior wound now on the lateral left leg has closed but she's had one wound that remains open on the lateral left leg. This is not the same spot as we dealt with in 2018. ABIs in this clinic were 1.3 bilaterally 02/26/18; patient has a small wound on the left lateral calf. Dimensions are down. She has chronic venous insufficiency and lymphedema. 03/05/18; small open area on the left lateral calf. Dimensions are down. Tightly adherent necrotic debris over the surface of the wound which was difficult to remove. Also the dressing  [over collagen] stuck to the wound surface. This was removed with some difficulty as well. Change the primary dressing to Hydrofera Blue ready 03/12/18; small open area on the left lateral calf. Comes in with tightly adherent surface eschar as well as some adherent Hydrofera Blue. 03/19/18; open area on the left lateral calf. Again adherent surface eschar as well as some adherent Hydrofera Blue nonviable subcutaneous tissue. She complained of pain all week even with the reduction from 4-3 layer compression I put on last week. Also she had an increase in her ankle and calf measurements probably related to the same thing. 03/26/18; open area on the left lateral calf. A very small open area remains here. We used silver alginate starting last week as the Hydrofera Blue seem to stick to the wound bed. In using 4-layer compression 04/02/18; the open area in the left lateral calf at some adherent slough which I removed there is no open area here. We are able to transition her into her own compression stocking. Truthfully I think this is probably his support hose. However this does not maintain skin integrity will be limited. She cannot put over the toe compression stockings on because of neck problems hand problems etc. She is allergic to the lining layer of juxta lites. We might be forced to use extremitease stocking should this fail READMIT 11/24/2018 Patient is now  a 72 year old woman who is not a diabetic. She has been in this clinic on at least 3 previous occasions largely with recurrent wounds on her left leg secondary to chronic venous insufficiency with secondary lymphedema. Her situation is complicated by inability to get stockings on and an allergy to neoprene which is apparently a component and at least juxta lites and other stockings. As a result she really has not been wearing any stockings on her legs. She tells [place] that roughly 2 or 3 weeks ago she started noticing a stinging sensation just  above her ankle on the left medial aspect. She has been diagnosed with pseudogout and she wondered whether this was what she was experiencing. She tried to dress this with something she bought at the store however subsequently it pulled skin off and now she has an open wound that is not improving. She has been using Vaseline gauze with a cover bandage. She saw her primary doctor last week who put an Haematologist on her. ABIs in this clinic was 1.03 on the left [name], [name]. ([id number]) 2/12; the area is on the left medial ankle. Odd-looking wound with what looks to be surface epithelialization but a multitude of small petechial openings. This clearly not closed yet. We have been using silver alginate under 3 layer compression with TCA 2/19; the wound area did not look quite as good this week. Necrotic debris over the majority of the wound surface which required debridement. She continues to have a multitude of what looked to be small petechial openings. She reminds [place] that she had a biopsy on this initially during her first outbreak in 2015 in [place] dermat[facility]. She expresses concern about this being a possible melanoma. 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 I assured her that this does not look anything like melanoma. Except for the petechial reaction it does look like a venous insufficiency area and she certainly has evidence of this on both sides 2/26; a difficult area on the left medial ankle. The patient clearly has chronic venous hypertension with some degree of lymphedema. The odd thing about the area is the small petechial hemorrhages. I am not really sure how to explain this. This was present last time and this is not a compression injury. We have been using Hydrofera Blue which I changed to last week 3/4; still using Hydrofera Blue. Aggressive debridement today. She does not have known  arterial issues. She has seen Dr. Kellie Simmering at [facility] and and has an ablation on the left. [Anterior accessory branch of the greater saphenous]. From what I remember they did not feel she had an arterial issue. The patient has had this area biopsied in 2009 at [facility] and by her recollection they said this was "stasis". She is also follow-up with dermatology locally who thought that this was more of a vascular issue 3/11; using Hydrofera Blue. Aggressive debridement today. She does not have an arterial issue. We are using 3 layer compression although we may need to go to 4. The patient has been in for multiple changes to her wrap since I last saw her a week ago. She says that the area was leaking. I do not have too much more information on what was found 01/19/19 on evaluation today patient was actually being seen for a nurse visit when unfortunately she had the area on her left lateral lower extremity as well  as weeping from the right lower extremity that became apparent. Therefore we did end up actually seeing her for a full visit with myself. She is having some pain at this site as well but fortunately nothing too significant at this point. No fevers, chills, nausea, or vomiting noted at this time. 3/18-Patient is back to the clinic with the left leg venous leg ulcer, the ulcer is larger in size, has a surface that is densely adherent with fibrinous tissue, the Hydrofera Blue was used but is densely adherent and there was difficulty in removing it. The right lower extremity was also wrapped for weeping edema. Patient has a new area over the left lateral foot above the malleolus that is small and appears to have no debris with intact surrounding skin. Patient is on increased dose of Lasix also as a means to edema management 3/25; the patient has a nonhealing venous ulcer on the medial left leg and last week developed a smaller area on the lateral left calf. We have  been using Hydrofera Blue with a contact layer. 4/1; no major change in these wounds areas. Left medial and more recently left lateral calf. I tried Iodoflex last week to aid in debridement she did not tolerate this. She stated her pain was terrible all week. She took the top layer of the 4 layer compression off. Electronic Signature(s) Signed: 02/04/2019 4:06:01 PM By: Linton Ham MD Entered By: Linton Ham on 02/04/2019 11:11:29 [name] ([id number]) -------------------------------------------------------------------------------- Physical Exam Details Patient [name]: [name], [name]. Date of Service: 02/04/2019 10:30 AM Medical Record Number: [id number] Patient Account Number: [id number] Date of Birth/Sex: [birth year] (72 y.o. F) Treating RN: Primary Care Provider: [name] Other Clinician: Referring Provider: [name] Treating Provider/Extender: Tito Dine in Treatment: 8 Constitutional Patient is hypertensive.. Pulse regular and within target range for patient.Marland Kitchen Respirations regular, non-labored and within target range.. Temperature is normal and within the target range for the patient.Marland Kitchen appears in no distress. Notes Wound exam; left medial calf. Tightly adherent fibrinous debris. Using a #5 curette I attempted debridement however she tolerated this very poorly. The left medial requires further debridement. We I am not getting down to a healthy surface here. The area on the lateral is about the same condition just a smaller wound. Not overtly infected Electronic Signature(s) Signed: 02/04/2019 4:06:01 PM By: Linton Ham MD Entered By: Linton Ham on 02/04/2019 11:17:00 [name], [name] ([id number]) -------------------------------------------------------------------------------- Physician Orders Details Patient [name]: [name], [name]. Date of Service: 02/04/2019 10:30 AM Medical Record Number: [id number] Patient Account Number: [id number] Date  of Birth/Sex: 1946-04-17 (72 y.o. F) Treating RN: Cornell Barman Primary Care Provider: [name] Other Clinician: Referring Provider: [name] Treating Provider/Extender: Tito Dine in Treatment: 8 Verbal / Phone Orders: No Diagnosis Coding Wound Cleansing Wound #5 Left,Medial Lower Leg o Clean wound with Normal Saline. o May Shower, gently pat wound dry prior to applying new dressing. Wound #6 Left,Lateral Lower Leg o Clean wound with Normal Saline. o May Shower, gently pat wound dry prior to applying new dressing. Anesthetic (add to Medication List) Wound #5 Left,Medial Lower Leg o Topical Lidocaine 4% cream applied to wound bed prior to debridement (In Clinic Only). o Benzocaine Topical Anesthetic Spray applied to wound bed prior to debridement (In Clinic Only). Wound #6 Left,Lateral Lower Leg o Topical Lidocaine 4% cream applied to wound bed prior to debridement (In Clinic Only). o Benzocaine Topical Anesthetic Spray applied to wound bed prior to debridement (In  Clinic Only). Skin Barriers/Peri-Wound Care Wound #5 Left,Medial Lower Leg o Triamcinolone Acetonide Ointment (TCA) - on wound and peri-wound Wound #6 Left,Lateral Lower Leg o Triamcinolone Acetonide Ointment (TCA) - on wound and peri-wound Primary Wound Dressing Wound #5 Left,Medial Lower Leg o Other: - Sorbact Wound #6 Left,Lateral Lower Leg o Other: - Sorbact Secondary Dressing Wound #5 Left,Medial Lower Leg o ABD pad Wound #6 Left,Lateral Lower Leg o ABD pad Dressing Change Frequency Wound #5 Left,Medial Lower Leg o Change dressing every week o Other: - Friday [name], [name] ([id number]) Wound #6 Left,Lateral Lower Leg o Change dressing every week o Other: - Friday Follow-up Appointments Wound #6 Left,Lateral Lower Leg o Return Appointment in 1 week. o Nurse Visit as needed Edema Control Wound #5 Left,Medial Lower Leg o Unna Boot to  Left Lower Extremity - ABD up shin for comfort Off-Loading Wound #5 Left,Medial Lower Leg o Other: - Elevate legs as needed Wound #6 Left,Lateral Lower Leg o Other: - Elevate legs as needed Electronic Signature(s) Signed: 02/04/2019 4:06:01 PM By: Linton Ham MD Signed: 02/05/2019 4:09:12 PM By: [name], [name], RN, CWS, [name], [name] Entered By: [name], [name], RN, CWS, Kim on 02/04/2019 11:05:52 NIQUITA, DIGIOIA ([id number]) -------------------------------------------------------------------------------- Problem List Details Patient [name]: [name], [name]. Date of Service: 02/04/2019 10:30 AM Medical Record Number: [id number] Patient Account Number: [id number] Date of Birth/Sex: 07-Sep-1946 (72 y.o. F) Treating RN: Primary Care Provider: [name] Other Clinician: Referring Provider: [name] Treating Provider/Extender: Tito Dine in Treatment: 8 Active Problems ICD-10 Evaluated Encounter Code Description Active Date Today Diagnosis L97.221 Non-pressure chronic ulcer of left calf limited to breakdown of 01/07/2019 No Yes skin I87.321 Chronic venous hypertension (idiopathic) with inflammation of 12/10/2018 No Yes right lower extremity I89.0 Lymphedema, not elsewhere classified 12/10/2018 No Yes Inactive Problems Resolved Problems Electronic Signature(s) Signed: 02/04/2019 4:06:01 PM By: Linton Ham MD Entered By: Linton Ham on 02/04/2019 11:09:57 Cadet, [name] ([id number]) -------------------------------------------------------------------------------- Progress Note Details Patient [name]: [name], [name]. Date of Service: 02/04/2019 10:30 AM Medical Record Number: [id number] Patient Account Number: [id number] Date of Birth/Sex: Jul 11, 1946 (72 y.o. F) Treating RN: Primary Care Provider: [name] Other Clinician: Referring Provider: [name] Treating Provider/Extender: Tito Dine in Treatment: 8 Subjective History of Present  Illness (HPI) Pleasant 73 year old with history of chronic venous insufficiency. No diabetes or peripheral vascular disease. Left ABI 1.29. Questionable history of left lower extremity DVT. She developed a recurrent ulceration on her left lateral calf in December 2015, which she attributes to poor diet and subsequent lower extremity edema. She underwent endovenous laser ablation of her left greater saphenous vein in 2010. She underwent laser ablation of accessory branch of left GSV in April 2016 by Dr. Kellie Simmering at [facility]. She was previously wearing Unna boots, which she tolerated well. Tolerating 2 layer compression and cadexomer iodine. She returns to clinic for follow-up and is without new complaints. She denies any significant pain at this time. She reports persistent pain with pressure. No claudication or ischemic rest pain. No fever or chills. No drainage. READMISSION 11/13/16; this is a 73 year old woman who is not a diabetic. She is here for a review of a painful area on her left medial lower extremity. I note that she was seen here previously last year for wound I believe to be in the same area. At that time she had undergone previously a left greater saphenous vein ablation by Dr. Kellie Simmering and she had a ablation of the anterior accessory  branch of the left greater saphenous vein in March 2016. Seeing that the wound actually closed over. In reviewing the history with her today the ulcer in this area has been recurrent. She describes a biopsy of this area in 2009 that only showed stasis physiology. 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 Do not see that she has ever had arterial studies in the left leg. She tells me she has a follow-up with Dr. Kellie Simmering in roughly 10  days In any case she developed the reopening of this area roughly a month ago. On the background of this she describes rapidly increasing edema which has responded to Lasix 40 mg and metolazone 2.5 mg as well as the patient's lymph massage. She has been told she has both venous insufficiency and lymphedema but she cannot tolerate compression stockings 11/28/16; the patient saw Dr. Kellie Simmering recently. Per the patient he did arterial Dopplers in the office that did not show evidence of arterial insufficiency, per the patient he stated "treat this like an ordinary venous ulcer". She also saw her dermatologist Dr. Ronnald Ramp who felt that this was more of a vascular ulcer. In general things are improving although she arrives today with increasing bilateral lower extremity edema with weeping a deeper fluid through the wound on the left medial leg compatible with some degree of lymphedema 12/04/16; the patient's wound is fully epithelialized but I don't think fully healed. We will do another week of depression with Promogran and TCA however I suspect we'll be able to discharge her next week. This is a very unusual-looking wound which was initially a figure-of-eight type wound lying on its side surrounded by petechial like hemorrhage. She has had venous ablation on this side. She apparently does not have an arterial issue per Dr. Kellie Simmering. She saw her dermatologist thought it was "vascular". Patient is definitely going to need ongoing compression and I talked about this with her today she will go to elastic therapy after she leaves here next week 12/11/16; the patient's wound is not completely closed today. She has surrounding scar tissue and in further discussion with the patient it would appear that she had ulcers in this area in 2009 for a prolonged period of time ultimately requiring a punch biopsy of this area that only showed venous insufficiency. I did not previously pickup on this part of the history from  the patient. 12/18/16; the patient's wound is completely epithelialized. There is no open area here. She has significant bilateral venous insufficiency with secondary lymphedema to a mild-to-moderate degree she does not have compression stockings.. She did not say anything to me when I was in the room, she told our intake nurse that she was still having pain in this area. This isn't unusual recurrent small open area. She is going to go to elastic therapy to obtain compression stockings. 12/25/16; the patient's wound is fully epithelialized. There is no open area here. The patient describes some continued episodic discomfort in this area medial left calf. However everything looks fine and healed here. She is been to elastic therapy and KARINNE, SCHMADER ([id number]) caught herself 15-20 mmHg stockings, they apparently were having trouble getting 20-30 mm stockings in her size 01/22/17; this is a patient we discharged from the clinic a month ago.  She has a recurrent open wound on her medial left calf. She had 15 mm support stockings. I told her I thought she needed 20-30 mm compression stockings. She tells me that she has been ill with hospitalization secondary to asthma and is been found to have severe hypokalemia likely secondary to a combination of Lasix and metolazone. This morning she noted blistering and leaking fluid on the posterior part of her left leg. She called our intake nurse urgently and we was saw her this afternoon. She has not had any real discomfort here. I don't know that she's been wearing any stockings on this leg for at least 2-3 days. ABIs in this clinic were 1.21 on the right and 1.3 on the left. She is previously seen vascular surgery who does not think that there is a peripheral arterial issue. 01/30/17; Patient arrives with no open wound on the left leg. She has been to elastic therapy and obtained 20-16mmhg below knee stockings and she has one on the right leg  today. READMISSION 02/19/18; this Hoglund is a now 73 year old patient we've had in this clinic perhaps 3 times before. I had last looked at her from January 07 December 2016 with an area on the medial left leg. We discharged her on 12/25/16 however she had to be readmitted on 01/22/17 with a recurrence. I have in my notes that we discharged her on 20-30 mm stockings although she tells me she was only wearing support hose because she cannot get stockings on predominantly related to her cervical spine surgery/issues. She has had previous ablations done by vein and vascular in [place] including a great saphenous vein ablation on the left with an anterior accessory branch ablation I think both of these were in 2016. On one of the previous visit she had a biopsy noted 2009 that was negative. She is not felt to have an arterial issue. She is not a diabetic. She does have a history of obstructive sleep apnea hypertension asthma as well as chronic venous insufficiency and lymphedema. On this occasion she noted 2 dry scaly patch on her left leg. She tried to put lotion on this it didn't really help. There were 2 open areas.the patient has been seeing her primary physician from 02/05/18 through 02/14/18. She had Unna boots applied. The superior wound now on the lateral left leg has closed but she's had one wound that remains open on the lateral left leg. This is not the same spot as we dealt with in 2018. ABIs in this clinic were 1.3 bilaterally 02/26/18; patient has a small wound on the left lateral calf. Dimensions are down. She has chronic venous insufficiency and lymphedema. 03/05/18; small open area on the left lateral calf. Dimensions are down. Tightly adherent necrotic debris over the surface of the wound which was difficult to remove. Also the dressing [over collagen] stuck to the wound surface. This was removed with some difficulty as well. Change the primary dressing to Hydrofera Blue ready 03/12/18;  small open area on the left lateral calf. Comes in with tightly adherent surface eschar as well as some adherent Hydrofera Blue. 03/19/18; open area on the left lateral calf. Again adherent surface eschar as well as some adherent Hydrofera Blue nonviable subcutaneous tissue. She complained of pain all week even with the reduction from 4-3 layer compression I put on last week. Also she had an increase in her ankle and calf measurements probably related to the same thing. 03/26/18; open area on the left lateral calf.  A very small open area remains here. We used silver alginate starting last week as the Hydrofera Blue seem to stick to the wound bed. In using 4-layer compression 04/02/18; the open area in the left lateral calf at some adherent slough which I removed there is no open area here. We are able to transition her into her own compression stocking. Truthfully I think this is probably his support hose. However this does not maintain skin integrity will be limited. She cannot put over the toe compression stockings on because of neck problems hand problems etc. She is allergic to the lining layer of juxta lites. We might be forced to use extremitease stocking should this fail READMIT 11/24/2018 Patient is now a 73 year old woman who is not a diabetic. She has been in this clinic on at least 3 previous occasions largely with recurrent wounds on her left leg secondary to chronic venous insufficiency with secondary lymphedema. Her situation is complicated by inability to get stockings on and an allergy to neoprene which is apparently a component and at least juxta lites and other stockings. As a result she really has not been wearing any stockings on her legs. She tells [place] that roughly 2 or 3 weeks ago she started noticing a stinging sensation just above her ankle on the left medial aspect. She has been diagnosed with pseudogout and she wondered whether this was what she was experiencing. She tried to  dress this with something she bought at the store however subsequently it pulled skin off and now she has an open wound that is not improving. She has been using Vaseline gauze with a cover bandage. She saw her primary doctor last week who put an Haematologist on her. ABIs in this clinic was 1.03 on the left [name], [name]. ([id number]) 2/12; the area is on the left medial ankle. Odd-looking wound with what looks to be surface epithelialization but a multitude of small petechial openings. This clearly not closed yet. We have been using silver alginate under 3 layer compression with TCA 2/19; the wound area did not look quite as good this week. Necrotic debris over the majority of the wound surface which required debridement. She continues to have a multitude of what looked to be small petechial openings. She reminds [place] that she had a biopsy on this initially during her first outbreak in 2015 in [place] [facility]. She expresses concern about this being a possible melanoma. 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 I assured her that this does not look anything like melanoma. Except for the petechial reaction it does look like a venous insufficiency area and she certainly has evidence of this on both sides 2/26; a difficult area on the left medial ankle. The patient clearly has chronic venous hypertension with some degree of lymphedema. The odd thing about the area is the small petechial hemorrhages. I am not really sure how to explain this. This was present last time and this is not a compression injury. We have been using Hydrofera Blue which I changed to last week 3/4; still using Hydrofera Blue. Aggressive debridement today. She does not have known arterial issues. She has seen Dr. Kellie Simmering at [facility] and and has an ablation on the left. [Anterior accessory branch of the  greater saphenous]. From what I remember they did not feel she had an arterial issue. The patient has had this area biopsied in  2009 at [facility] dermatology and by her recollection they said this was "stasis". She is also follow-up with dermatology locally who thought that this was more of a vascular issue 3/11; using Hydrofera Blue. Aggressive debridement today. She does not have an arterial issue. We are using 3 layer compression although we may need to go to 4. The patient has been in for multiple changes to her wrap since I last saw her a week ago. She says that the area was leaking. I do not have too much more information on what was found 01/19/19 on evaluation today patient was actually being seen for a nurse visit when unfortunately she had the area on her left lateral lower extremity as well as weeping from the right lower extremity that became apparent. Therefore we did end up actually seeing her for a full visit with myself. She is having some pain at this site as well but fortunately nothing too significant at this point. No fevers, chills, nausea, or vomiting noted at this time. 3/18-Patient is back to the clinic with the left leg venous leg ulcer, the ulcer is larger in size, has a surface that is densely adherent with fibrinous tissue, the Hydrofera Blue was used but is densely adherent and there was difficulty in removing it. The right lower extremity was also wrapped for weeping edema. Patient has a new area over the left lateral foot above the malleolus that is small and appears to have no debris with intact surrounding skin. Patient is on increased dose of Lasix also as a means to edema management 3/25; the patient has a nonhealing venous ulcer on the medial left leg and last week developed a smaller area on the lateral left calf. We have been using Hydrofera Blue with a contact layer. 4/1; no major change in these wounds areas. Left medial and more recently left lateral calf. I  tried Iodoflex last week to aid in debridement she did not tolerate this. She stated her pain was terrible all week. She took the top layer of the 4 layer compression off. Objective Constitutional Patient is hypertensive.. Pulse regular and within target range for patient.Marland Kitchen Respirations regular, non-labored and within target range.. Temperature is normal and within the target range for the patient.Marland Kitchen appears in no distress. Vitals Time Taken: 10:30 AM, Height: 63 in, Weight: 224.7 lbs, BMI: 39.8, Temperature: 98.0 F, Pulse: 71 bpm, Respiratory Rate: 18 breaths/min, Blood Pressure: 148/77 mmHg. General Notes: Wound exam; left medial calf. Tightly adherent fibrinous debris. Using a #5 curette I attempted debridement however she tolerated this very poorly. The left medial requires further debridement. We I am not getting down to a healthy surface here. The area on the lateral is about the same condition just a smaller wound. Not overtly infected [name], [name] ([id number]) Integumentary (Hair, Skin) Wound #5 status is Open. Original cause of wound was Gradually Appeared. The wound is located on the Left,Medial Lower Leg. The wound measures 3.4cm length x 2.6cm width x 0.2cm depth; 6.943cm^2 area and 1.389cm^3 volume. There is Fat Layer (Subcutaneous Tissue) Exposed exposed. There is no tunneling or undermining noted. There is a medium amount of serosanguineous drainage noted. The wound margin is indistinct and nonvisible. There is medium (34-66%) red, pink granulation within the wound bed. There is a medium (34-66%) amount of necrotic tissue within the wound bed including Adherent Slough. The periwound skin appearance exhibited: Maceration, Hemosiderin Staining. The periwound skin appearance did not exhibit: Callus, Crepitus, Excoriation, Induration, Rash, Scarring, Dry/Scaly, Atrophie  Blanche, Cyanosis, Ecchymosis, Mottled, Pallor, Rubor, Erythema. Periwound temperature was noted as No  Abnormality. The periwound has tenderness on palpation. Wound #6 status is Open. Original cause of wound was Gradually Appeared. The wound is located on the Left,Lateral Lower Leg. The wound measures 1.3cm length x 1.8cm width x 0.1cm depth; 1.838cm^2 area and 0.184cm^3 volume. There is Fat Layer (Subcutaneous Tissue) Exposed exposed. There is no tunneling or undermining noted. There is a medium amount of serous drainage noted. The wound margin is flat and intact. There is large (67-100%) red granulation within the wound bed. There is a small (1-33%) amount of necrotic tissue within the wound bed including Adherent Slough. The periwound skin appearance exhibited: Hemosiderin Staining. The periwound skin appearance did not exhibit: Callus, Crepitus, Excoriation, Induration, Rash, Scarring, Dry/Scaly, Maceration, Atrophie Blanche, Cyanosis, Ecchymosis, Mottled, Pallor, Rubor, Erythema. Periwound temperature was noted as No Abnormality. The periwound has tenderness on palpation. Assessment Active Problems ICD-10 Non-pressure chronic ulcer of left calf limited to breakdown of skin Chronic venous hypertension (idiopathic) with inflammation of right lower extremity Lymphedema, not elsewhere classified Procedures Wound #5 Pre-procedure diagnosis of Wound #5 is a Lymphedema located on the Left,Medial Lower Leg . There was a Excisional Skin/Subcutaneous Tissue Debridement with a total area of 8.84 sq cm performed by [name] Dillon, MD. With the following instrument(s): Curette to remove Viable and Non-Viable tissue/material. Material removed includes Subcutaneous Tissue and Slough and after achieving pain control using Lidocaine. No specimens were taken. A time out was conducted at 11:03, prior to the start of the procedure. A Moderate amount of bleeding was controlled with Pressure. The procedure was tolerated well with a pain level of 5 throughout and a pain level of 4 following the procedure.  Post Debridement Measurements: 3.4cm length x 2.6cm width x 0.2cm depth; 1.389cm^3 volume. Character of Wound/Ulcer Post Debridement requires further debridement. Post procedure Diagnosis Wound #5: Same as Pre-Procedure Plan Wound Cleansing: [name], KINNICK. ([id number]) Wound #5 Left,Medial Lower Leg: Clean wound with Normal Saline. May Shower, gently pat wound dry prior to applying new dressing. Wound #6 Left,Lateral Lower Leg: Clean wound with Normal Saline. May Shower, gently pat wound dry prior to applying new dressing. Anesthetic (add to Medication List): Wound #5 Left,Medial Lower Leg: Topical Lidocaine 4% cream applied to wound bed prior to debridement (In Clinic Only). Benzocaine Topical Anesthetic Spray applied to wound bed prior to debridement (In Clinic Only). Wound #6 Left,Lateral Lower Leg: Topical Lidocaine 4% cream applied to wound bed prior to debridement (In Clinic Only). Benzocaine Topical Anesthetic Spray applied to wound bed prior to debridement (In Clinic Only). Skin Barriers/Peri-Wound Care: Wound #5 Left,Medial Lower Leg: Triamcinolone Acetonide Ointment (TCA) - on wound and peri-wound Wound #6 Left,Lateral Lower Leg: Triamcinolone Acetonide Ointment (TCA) - on wound and peri-wound Primary Wound Dressing: Wound #5 Left,Medial Lower Leg: Other: - Sorbact Wound #6 Left,Lateral Lower Leg: Other: - Sorbact Secondary Dressing: Wound #5 Left,Medial Lower Leg: ABD pad Wound #6 Left,Lateral Lower Leg: ABD pad Dressing Change Frequency: Wound #5 Left,Medial Lower Leg: Change dressing every week Other: - Friday Wound #6 Left,Lateral Lower Leg: Change dressing every week Other: - Friday Follow-up Appointments: Wound #6 Left,Lateral Lower Leg: Return Appointment in 1 week. Nurse Visit as needed Edema Control: Wound #5 Left,Medial Lower Leg: Unna Boot to Left Lower Extremity - ABD up shin for comfort Off-Loading: Wound #5 Left,Medial Lower Leg: Other: -  Elevate legs as needed Wound #6 Left,Lateral Lower Leg: Other: - Elevate legs as  needed 1. I am having difficulty finding wound products and wraps that the patient can tolerate. I moved the Sorbact under an Haematologist today to both wound areas 2. Continue TCA 3. Unfortunately she will require further mechanical debridement. She is not tolerating this well Electronic Signature(s) Signed: 02/04/2019 4:06:01 PM By: Linton Ham MD EMMERSYN, KRATZKE ([id number]) Entered By: Linton Ham on 02/04/2019 11:18:06 [name], [name] ([id number]) -------------------------------------------------------------------------------- SuperBill Details Patient [name]: [name], [name]. Date of Service: 02/04/2019 Medical Record Number: [id number] Patient Account Number: [id number] Date of Birth/Sex: 1945/12/11 (73 y.o. F) Treating RN: Primary Care Provider: [name] Other Clinician: Referring Provider: [name] Treating Provider/Extender: Tito Dine in Treatment: 8 Diagnosis Coding ICD-10 Codes Code Description [id number] Non-pressure chronic ulcer of left calf limited to breakdown of skin I87.321 Chronic venous hypertension (idiopathic) with inflammation of right lower extremity I89.0 Lymphedema, not elsewhere classified Facility Procedures CPT4 Code: [id number] Description: 07121 - DEB SUBQ TISSUE 20 SQ CM/< ICD-10 Diagnosis Description L97.221 Non-pressure chronic ulcer of left calf limited to breakdown Modifier: of skin Quantity: 1 Physician Procedures CPT4 Code: [id number] Description: 54982 - WC PHYS SUBQ TISS 20 SQ CM ICD-10 Diagnosis Description L97.221 Non-pressure chronic ulcer of left calf limited to breakdown Modifier: of skin Quantity: 1 Electronic Signature(s) Signed: 02/04/2019 4:06:01 PM By: Linton Ham MD Entered By: Linton Ham on 02/04/2019 11:18:49

## 2019-02-06 NOTE — Progress Notes (Signed)
[name], [name] ([id number]) Visit Report for 02/04/2019 Arrival Information Details Patient Name: [name], [name]. Date of Service: 02/04/2019 10:30 AM Medical Record Number: [id number] Patient Account Number: [id number] Date of Birth/Sex: May 31, 1946 (73 y.o. F) Treating RN: [name] Primary Care [name]: [name] Other Clinician: Referring [name]: [name] Treating [name]/Extender: [name] in Treatment: 8 Visit Information History Since Last Visit Added or deleted any medications: No Patient Arrived: Ambulatory Any new allergies or adverse reactions: No Arrival Time: 10:31 Had a fall or experienced change in No Accompanied By: self activities of daily living that may affect Transfer Assistance: None risk of falls: Patient Identification Verified: Yes Signs or symptoms of abuse/neglect since last visito No Secondary Verification Process Completed: Yes Hospitalized since last visit: No Has Dressing in Place as Prescribed: Yes Has Compression in Place as Prescribed: Yes Pain Present Now: Yes Notes Patient removed top layer of wrap due to tightness. Electronic Signature(s) Signed: 02/06/2019 8:16:30 AM By: [name] Entered By: [name] on 02/04/2019 10:32:42 [name] ([id number]) -------------------------------------------------------------------------------- Encounter Discharge Information Details Patient Name: [name], [name]. Date of Service: 02/04/2019 10:30 AM Medical Record Number: [id number] Patient Account Number: [id number] Date of Birth/Sex: 1946-01-06 (73 y.o. F) Treating RN: [name] Primary Care Annagrace Carr: [name] Other Clinician: Referring Reeanna Acri: [name] Treating [name]/Extender: [name] in Treatment: 8 Encounter Discharge Information Items Post Procedure Vitals Discharge Condition: Stable Temperature (F): 98.0 Ambulatory Status: Ambulatory Pulse (bpm):  71 Discharge Destination: Home Respiratory Rate (breaths/min): 16 Transportation: Private Auto Blood Pressure (mmHg): 148/77 Accompanied By: self Schedule Follow-up Appointment: Yes Clinical Summary of Care: Electronic Signature(s) Signed: 02/04/2019 3:51:25 PM By: [name] Entered By: [name] on 02/04/2019 11:24:02 [name] ([id number]) -------------------------------------------------------------------------------- Lower Extremity Assessment Details Patient Name: [name], [name]. Date of Service: 02/04/2019 10:30 AM Medical Record Number: [id number] Patient Account Number: [id number] Date of Birth/Sex: [birth year] (73 y.o. F) Treating RN: [name] Primary Care Keyasha Miah: [name] Other Clinician: Referring [name]: [name] Treating Finlay Godbee/Extender: [name] in Treatment: 8 Edema Assessment Assessed: [Left: No] [Right: No] [Left: Edema] [Right: :] Calf Left: Right: Point of Measurement: 32 cm From Medial Instep 41.2 cm cm Ankle Left: Right: Point of Measurement: 12 cm From Medial Instep 22.3 cm cm Vascular Assessment Pulses: Dorsalis Pedis Palpable: [Left:Yes] Posterior Tibial Palpable: [Left:Yes] Extremity colors, hair growth, and conditions: Extremity Color: [Left:Hyperpigmented] Hair Growth on Extremity: [Left:No] Temperature of Extremity: [Left:Warm < 3 seconds] Toe Nail Assessment Left: Right: Thick: Yes Discolored: Yes Deformed: Yes Improper Length and Hygiene: No Electronic Signature(s) Signed: 02/06/2019 8:16:30 AM By: [name] Entered By: [name] on 02/04/2019 10:53:08 [name] ([id number]) -------------------------------------------------------------------------------- Multi Wound Chart Details Patient Name: [name], [name]. Date of Service: 02/04/2019 10:30 AM Medical Record Number: [id number] Patient Account Number: [id number] Date of Birth/Sex: 1946-10-13 (73 y.o. F) Treating RN:  [name] Primary Care Seeley Southgate: [name] Other Clinician: Referring [name]: [name] Treating Jaquelynn Wanamaker/Extender: [name] in Treatment: 8 Vital Signs Height(in): 63 Pulse(bpm): 86 Weight(lbs): 224.7 Blood Pressure(mmHg): 148/77 Body Mass Index(BMI): 40 Temperature(F): 98.0 Respiratory Rate 18 (breaths/min): Photos: [N/A:N/A] Wound Location: Left Lower Leg - Medial Left Lower Leg - Lateral N/A Wounding Event: Gradually Appeared Gradually Appeared N/A Primary Etiology: Lymphedema Venous Leg Ulcer N/A Comorbid History: Cataracts, Asthma, Sleep Cataracts, Asthma, Sleep N/A Apnea, Deep Vein Apnea, Deep Vein Thrombosis, Hypertension, Thrombosis, Hypertension, Peripheral Venous Disease, Peripheral Venous Disease, Osteoarthritis, Received Osteoarthritis, Received Chemotherapy, Received  Chemotherapy, Received Radiation Radiation Date Acquired: 11/19/2018 01/19/2019 N/A Weeks of Treatment: 8 2 N/A Wound Status: Open Open N/A Measurements L x W x D 3.4x2.6x0.2 1.3x1.8x0.1 N/A (cm) Area (cm) : 6.943 1.838 N/A Volume (cm) : 1.389 0.184 N/A % Reduction in Area: 18.70% -735.50% N/A % Reduction in Volume: -62.50% -736.40% N/A Classification: Full Thickness Without Full Thickness Without N/A Exposed Support Structures Exposed Support Structures Exudate Amount: Medium Medium N/A Exudate Type: Serosanguineous Serous N/A Exudate Color: red, brown amber N/A Wound Margin: Indistinct, nonvisible Flat and Intact N/A Granulation Amount: Medium (34-66%) Large (67-100%) N/A Granulation Quality: Red, Pink Red N/A Necrotic Amount: Medium (34-66%) Small (1-33%) N/A Exposed Structures: N/A [name] ([id number]) Fat Layer (Subcutaneous Fat Layer (Subcutaneous Tissue) Exposed: Yes Tissue) Exposed: Yes Fascia: No Fascia: No Tendon: No Tendon: No Muscle: No Muscle: No Joint: No Joint: No Bone: No Bone: No Epithelialization: Small (1-33%) None  N/A Debridement: Debridement - Excisional N/A N/A Pre-procedure 11:03 N/A N/A Verification/Time Out Taken: Pain Control: Lidocaine N/A N/A Tissue Debrided: Subcutaneous, Slough N/A N/A Level: Skin/Subcutaneous Tissue N/A N/A Debridement Area (sq cm): 8.84 N/A N/A Instrument: Curette N/A N/A Bleeding: Moderate N/A N/A Hemostasis Achieved: Pressure N/A N/A Procedural Pain: 5 N/A N/A Post Procedural Pain: 4 N/A N/A Debridement Treatment Procedure was tolerated well N/A N/A Response: Post Debridement 3.4x2.6x0.2 N/A N/A Measurements L x W x D (cm) Post Debridement Volume: 1.389 N/A N/A (cm) Periwound Skin Texture: Excoriation: No Excoriation: No N/A Induration: No Induration: No Callus: No Callus: No Crepitus: No Crepitus: No Rash: No Rash: No Scarring: No Scarring: No Periwound Skin Moisture: Maceration: Yes Maceration: No N/A Dry/Scaly: No Dry/Scaly: No Periwound Skin Color: Hemosiderin Staining: Yes Hemosiderin Staining: Yes N/A Atrophie Blanche: No Atrophie Blanche: No Cyanosis: No Cyanosis: No Ecchymosis: No Ecchymosis: No Erythema: No Erythema: No Mottled: No Mottled: No Pallor: No Pallor: No Rubor: No Rubor: No Temperature: No Abnormality No Abnormality N/A Tenderness on Palpation: Yes Yes N/A Wound Preparation: Ulcer Cleansing: Ulcer Cleansing: Other: soap N/A Rinsed/Irrigated with Saline, and water Other: soap and water Topical Anesthetic Applied: Topical Anesthetic Applied: None, Other: lidocaine 4% None, Other: lidocaine 4% Procedures Performed: Debridement N/A N/A Treatment Notes Electronic Signature(s) Signed: 02/04/2019 4:06:01 PM By: Linton Ham MD KALENE, CUTLER ([id number]) Entered By: Linton Ham on 02/04/2019 11:10:07 [name] ([id number]) -------------------------------------------------------------------------------- Multi-Disciplinary Care Plan Details Patient Name: [name], [name]. Date of Service: 02/04/2019  10:30 AM Medical Record Number: [id number] Patient Account Number: [id number] Date of Birth/Sex: 15-May-1946 (73 y.o. F) Treating RN: [name] Primary Care Reisha Wos: [name] Other Clinician: Referring Iyanah Demont: [name] Treating Braxden Lovering/Extender: [name] in Treatment: 8 Active Inactive Orientation to the Wound Care Program Nursing Diagnoses: Knowledge deficit related to the wound healing center program Goals: Patient/caregiver will verbalize understanding of the [facility] Program Date Initiated: 12/10/2018 Target Resolution Date: 01/09/2019 Goal Status: Active Interventions: Provide education on orientation to the wound center Notes: Soft Tissue Infection Nursing Diagnoses: Impaired tissue integrity Goals: Patient's soft tissue infection will resolve Date Initiated: 12/10/2018 Target Resolution Date: 01/09/2019 Goal Status: Active Interventions: Assess signs and symptoms of infection every visit Notes: Venous Leg Ulcer Nursing Diagnoses: Actual venous Insuffiency (use after diagnosis is confirmed) Goals: Patient will maintain optimal edema control Date Initiated: 12/10/2018 Target Resolution Date: 01/09/2019 Goal Status: Active Interventions: Assess peripheral edema status every visit. FRANCILE, WOOLFORD ([id number]) Treatment Activities: Therapeutic compression applied : 12/10/2018 Notes: Wound/Skin Impairment Nursing Diagnoses: Impaired tissue integrity  Goals: Patient/caregiver will verbalize understanding of skin care regimen Date Initiated: 12/10/2018 Target Resolution Date: 01/09/2019 Goal Status: Active Interventions: Assess ulceration(s) every visit Treatment Activities: Topical wound management initiated : 12/10/2018 Notes: Electronic Signature(s) Signed: 02/05/2019 4:09:12 PM By: [name], [name], RN, CWS, [name], [name] Entered By: [name], [name], RN, CWS, Kim on 02/04/2019 10:59:22 ATHANASIA, STANWOOD  ([id number]) -------------------------------------------------------------------------------- Pain Assessment Details Patient Name: [name], [name]. Date of Service: 02/04/2019 10:30 AM Medical Record Number: [id number] Patient Account Number: [id number] Date of Birth/Sex: [name] 17, 1946 (73 y.o. F) Treating RN: [name] Primary Care Johngabriel Verde: [name] Other Clinician: Referring Dabria Wadas: [name] Treating Jaeden Westbay/Extender: [name] in Treatment: 8 Active Problems Location of Pain Severity and Description of Pain Patient Has Paino Yes Site Locations Rate the pain. Current Pain Level: 3 Pain Management and Medication Current Pain Management: Electronic Signature(s) Signed: 02/06/2019 8:16:30 AM By: [name] Entered By: [name] on 02/04/2019 10:32:53 Fang, [name] ([id number]) -------------------------------------------------------------------------------- Patient/Caregiver Education Details Patient Name: [name], [name]. Date of Service: 02/04/2019 10:30 AM Medical Record Number: [id number] Patient Account Number: [id number] Date of Birth/Gender: Jun 14, 1946 (73 y.o. F) Treating RN: [name] Primary Care Physician: [name] Other Clinician: Referring Physician: Ria [name] Treating Physician/Extender: [name] in Treatment: 8 Education Assessment Education Provided To: Patient Education Topics Provided Wound/Skin Impairment: Handouts: Caring for Your Ulcer Methods: Demonstration, Explain/Verbal Responses: State content correctly Electronic Signature(s) Signed: 02/05/2019 4:09:12 PM By: [name], [name], RN, CWS, [name], [name] Entered By: [name], [name], RN, CWS, Kim on 02/04/2019 11:06:23 [name] ([id number]) -------------------------------------------------------------------------------- Wound Assessment Details Patient Name: [name], [name]. Date of Service: 02/04/2019 10:30 AM Medical Record Number:  [id number] Patient Account Number: [id number] Date of Birth/Sex: 07-Apr-1946 (73 y.o. F) Treating RN: [name] Primary Care Kaleo Condrey: [name] Other Clinician: Referring Caliph Borowiak: [name] Treating Jashira Cotugno/Extender: [name] in Treatment: 8 Wound Status Wound Number: 5 Primary Lymphedema Etiology: Wound Location: Left Lower Leg - Medial Wound Open Wounding Event: Gradually Appeared Status: Date Acquired: 11/19/2018 Comorbid Cataracts, Asthma, Sleep Apnea, Deep Vein Weeks Of Treatment: 8 History: Thrombosis, Hypertension, Peripheral Venous Clustered Wound: No Disease, Osteoarthritis, Received Chemotherapy, Received Radiation Photos Wound Measurements Length: (cm) 3.4 Width: (cm) 2.6 Depth: (cm) 0.2 Area: (cm) 6.943 Volume: (cm) 1.389 % Reduction in Area: 18.7% % Reduction in Volume: -62.5% Epithelialization: Small (1-33%) Tunneling: No Undermining: No Wound Description Full Thickness Without Exposed Support Classification: Structures Wound Margin: Indistinct, nonvisible Exudate Medium Amount: Exudate Type: Serosanguineous Exudate Color: red, brown Foul Odor After Cleansing: No Slough/Fibrino Yes Wound Bed Granulation Amount: Medium (34-66%) Exposed Structure Granulation Quality: Red, Pink Fascia Exposed: No Necrotic Amount: Medium (34-66%) Fat Layer (Subcutaneous Tissue) Exposed: Yes Necrotic Quality: Adherent Slough Tendon Exposed: No Muscle Exposed: No Joint Exposed: No Bone Exposed: No [name], Gagandeep J. ([id number]) Periwound Skin Texture Texture Color No Abnormalities Noted: No No Abnormalities Noted: No Callus: No Atrophie Blanche: No Crepitus: No Cyanosis: No Excoriation: No Ecchymosis: No Induration: No Erythema: No Rash: No Hemosiderin Staining: Yes Scarring: No Mottled: No Pallor: No Moisture Rubor: No No Abnormalities Noted: No Dry / Scaly: No Temperature / Pain Maceration: Yes Temperature:  No Abnormality Tenderness on Palpation: Yes Wound Preparation Ulcer Cleansing: Rinsed/Irrigated with Saline, Other: soap and water, Topical Anesthetic Applied: None, Other: lidocaine 4%, Treatment Notes Wound #5 (Left, Medial Lower Leg) Notes sorbact, ABD, unna boot, kerlix, coban Electronic Signature(s) Signed: 02/06/2019 8:16:30 AM By: [name] Entered By: [name] on 02/04/2019 10:51:00  [name], [name] ([id number]) -------------------------------------------------------------------------------- Wound Assessment Details Patient Name: [name], [name]. Date of Service: 02/04/2019 10:30 AM Medical Record Number: [id number] Patient Account Number: [id number] Date of Birth/Sex: 01/30/1946 (73 y.o. F) Treating RN: [name] Primary Care Corliss Lamartina: [name] Other Clinician: Referring Helia Haese: [name] Treating Parisa Pinela/Extender: [name] in Treatment: 8 Wound Status Wound Number: 6 Primary Venous Leg Ulcer Etiology: Wound Location: Left Lower Leg - Lateral Wound Open Wounding Event: Gradually Appeared Status: Date Acquired: 01/19/2019 Comorbid Cataracts, Asthma, Sleep Apnea, Deep Vein Weeks Of Treatment: 2 History: Thrombosis, Hypertension, Peripheral Venous Clustered Wound: No Disease, Osteoarthritis, Received Chemotherapy, Received Radiation Photos Wound Measurements Length: (cm) 1.3 Width: (cm) 1.8 Depth: (cm) 0.1 Area: (cm) 1.838 Volume: (cm) 0.184 % Reduction in Area: -735.5% % Reduction in Volume: -736.4% Epithelialization: None Tunneling: No Undermining: No Wound Description Full Thickness Without Exposed Support Classification: Structures Wound Margin: Flat and Intact Exudate Medium Amount: Exudate Type: Serous Exudate Color: amber Foul Odor After Cleansing: No Slough/Fibrino Yes Wound Bed Granulation Amount: Large (67-100%) Exposed Structure Granulation Quality: Red Fascia Exposed: No Necrotic  Amount: Small (1-33%) Fat Layer (Subcutaneous Tissue) Exposed: Yes Necrotic Quality: Adherent Slough Tendon Exposed: No Muscle Exposed: No Joint Exposed: No Bone Exposed: No Salemi, Venecia J. ([id number]) Periwound Skin Texture Texture Color No Abnormalities Noted: No No Abnormalities Noted: No Callus: No Atrophie Blanche: No Crepitus: No Cyanosis: No Excoriation: No Ecchymosis: No Induration: No Erythema: No Rash: No Hemosiderin Staining: Yes Scarring: No Mottled: No Pallor: No Moisture Rubor: No No Abnormalities Noted: No Dry / Scaly: No Temperature / Pain Maceration: No Temperature: No Abnormality Tenderness on Palpation: Yes Wound Preparation Ulcer Cleansing: Other: soap and water, Topical Anesthetic Applied: None, Other: lidocaine 4%, Treatment Notes Wound #6 (Left, Lateral Lower Leg) Notes sorbact, ABD, unna boot, kerlix, coban Electronic Signature(s) Signed: 02/06/2019 8:16:30 AM By: [name] Entered By: [name] on 02/04/2019 10:50:45 Boyde, [name] ([id number]) -------------------------------------------------------------------------------- Vitals Details Patient Name: TIPHANY, FAYSON. Date of Service: 02/04/2019 10:30 AM Medical Record Number: [id number] Patient Account Number: [id number] Date of Birth/Sex: 02/18/1946 (73 y.o. F) Treating RN: [name] Primary Care Lorrin Bodner: [name] Other Clinician: Referring Keyvin Rison: [name] Treating Arabia Nylund/Extender: [name] in Treatment: 8 Vital Signs Time Taken: 10:30 Temperature (F): 98.0 Height (in): 63 Pulse (bpm): 71 Weight (lbs): 224.7 Respiratory Rate (breaths/min): 18 Body Mass Index (BMI): 39.8 Blood Pressure (mmHg): 148/77 Reference Range: 80 - 120 mg / dl Electronic Signature(s) Signed: 02/06/2019 8:16:30 AM By: [name] Entered By: [name] on 02/04/2019 10:35:34

## 2019-02-10 DIAGNOSIS — G5603 Carpal tunnel syndrome, bilateral upper limbs: Secondary | ICD-10-CM | POA: Diagnosis not present

## 2019-02-11 ENCOUNTER — Other Ambulatory Visit: Payer: Self-pay

## 2019-02-11 ENCOUNTER — Encounter: Payer: Medicare Other | Admitting: Internal Medicine

## 2019-02-11 DIAGNOSIS — G4733 Obstructive sleep apnea (adult) (pediatric): Secondary | ICD-10-CM | POA: Diagnosis not present

## 2019-02-11 DIAGNOSIS — L97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d: Secondary | ICD-10-CM | POA: Diagnosis not present

## 2019-02-11 DIAGNOSIS — L97222 Non-pressure chronic ulcer of left calf with fat layer exposed: Secondary | ICD-10-CM | POA: Diagnosis not present

## 2019-02-11 DIAGNOSIS — I890 Lymphedema, not elsewhere classified: Secondary | ICD-10-CM | POA: Diagnosis not present

## 2019-02-11 DIAGNOSIS — I10 Essential (primary) hypertension: Secondary | ICD-10-CM | POA: Diagnosis not present

## 2019-02-11 DIAGNOSIS — I87302 Chronic venous hypertension (idiopathic) without complications of left lower extremity: Secondary | ICD-10-CM | POA: Diagnosis not present

## 2019-02-11 DIAGNOSIS — L97221 Non-pressure chronic ulcer of left calf limited to breakdown of skin: Secondary | ICD-10-CM | POA: Diagnosis not present

## 2019-02-11 DIAGNOSIS — J45909 Unspecified asthma, uncomplicated: Secondary | ICD-10-CM | POA: Diagnosis not present

## 2019-02-11 NOTE — Progress Notes (Signed)
Amber, [name] ([id number]) Visit Report for 02/11/2019 Arrival Information Details Patient Name: [name], [name]. Date of Service: 02/11/2019 9:45 AM Medical Record Number: [id number] Patient Account Number: [id number] Date of Birth/Sex: 1946/02/25 (73 y.o. F) Treating RN: Army Melia Primary Care Anira Senegal: [name] Other Clinician: Referring Haliyah Fryman: [name] Treating Tammy Ericsson/Extender: Tito Dine in Treatment: 9 Visit Information History Since Last Visit Added or deleted any medications: No Patient Arrived: Ambulatory Any new allergies or adverse reactions: No Arrival Time: 09:52 Had a fall or experienced change in No Accompanied By: self activities of daily living that may affect Transfer Assistance: None risk of falls: Signs or symptoms of abuse/neglect since last visito No Hospitalized since last visit: No Has Dressing in Place as Prescribed: Yes Pain Present Now: Yes Electronic Signature(s) Signed: 02/11/2019 12:01:06 PM By: Army Melia Entered By: Army Melia on 02/11/2019 09:52:46 Grothe, Kemari Lenna Sciara ([id number]) -------------------------------------------------------------------------------- Compression Therapy Details Patient Name: Amber, [name]. Date of Service: 02/11/2019 9:45 AM Medical Record Number: [id number] Patient Account Number: [id number] Date of Birth/Sex: Nov 18, 1945 (73 y.o. F) Treating RN: Cornell Barman Primary Care Jazlin Tapscott: [name] Other Clinician: Referring Marypat Kimmet: [name] Treating Detrell Umscheid/Extender: Tito Dine in Treatment: 9 Compression Therapy Performed for Wound Assessment: Wound #5 Left,Medial Lower Leg Performed By: Clinician Cornell Barman, RN Compression Type: Three Layer Pre Treatment ABI: 1 Post Procedure Diagnosis Same as Pre-procedure Electronic Signature(s) Signed: 02/11/2019 4:47:28 PM By: [name], [name], RN, CWS, [name], [name] Entered By: [name], [name], RN, CWS, Kim on 02/11/2019  10:18:53 [name] ([id number]) -------------------------------------------------------------------------------- Encounter Discharge Information Details Patient Name: Amber, [name]. Date of Service: 02/11/2019 9:45 AM Medical Record Number: [id number] Patient Account Number: [id number] Date of Birth/Sex: 09/09/1946 (73 y.o. F) Treating RN: Army Melia Primary Care Rishab Stoudt: [name] Other Clinician: Referring Coralee Edberg: [name] Treating Jordyn Doane/Extender: Tito Dine in Treatment: 9 Encounter Discharge Information Items Discharge Condition: Stable Ambulatory Status: Ambulatory Discharge Destination: Home Transportation: Private Auto Accompanied By: self Schedule Follow-up Appointment: Yes Clinical Summary of Care: Electronic Signature(s) Signed: 02/11/2019 12:01:06 PM By: Army Melia Entered By: Army Melia on 02/11/2019 10:49:45 Osterloh, [name] ([id number]) -------------------------------------------------------------------------------- Lower Extremity Assessment Details Patient Name: Amber, [name]. Date of Service: 02/11/2019 9:45 AM Medical Record Number: [id number] Patient Account Number: [id number] Date of Birth/Sex: Mar 30, 1946 (73 y.o. F) Treating RN: Army Melia Primary Care [name] [name]: [name] Other Clinician: Referring Maple Odaniel: [name] Treating [name] Soberanis/Extender: [name] Dillon Weeks in Treatment: 9 Edema Assessment Assessed: [Left: No] [Right: No] Edema: [Left: N] [Right: o] Vascular Assessment Pulses: Dorsalis Pedis Palpable: [Left:Yes] Electronic Signature(s) Signed: 02/11/2019 12:01:06 PM By: Army Melia Entered By: Army Melia on 02/11/2019 10:01:05 [name] ([id number]) -------------------------------------------------------------------------------- Multi Wound Chart Details Patient Name: Amber, [name]. Date of Service: 02/11/2019 9:45 AM Medical Record Number: [id number] Patient Account  Number: [id number] Date of Birth/Sex: 05/24/1946 (73 y.o. F) Treating RN: Cornell Barman Primary Care Biff Rutigliano: [name] Other Clinician: Referring Roczen Waymire: [name] Treating Anniemae Haberkorn/Extender: Tito Dine in Treatment: 9 Vital Signs Height(in): 63 Pulse(bpm): 48 Weight(lbs): 224.7 Blood Pressure(mmHg): 154/74 Body Mass Index(BMI): 40 Temperature(F): 98.0 Respiratory Rate 16 (breaths/min): Photos: [5:No Photos] [6:No Photos] [N/A:N/A] Wound Location: [5:Left Lower Leg - Medial] [6:Left Lower Leg - Lateral] [N/A:N/A] Wounding Event: [5:Gradually Appeared] [6:Gradually Appeared] [N/A:N/A] Primary Etiology: [5:Lymphedema] [6:Venous Leg Ulcer] [N/A:N/A] Comorbid History: [5:Cataracts, Asthma, Sleep Apnea, Deep Vein Thrombosis, Hypertension, Peripheral Venous Disease, Osteoarthritis, Received Chemotherapy, Received Radiation] [6:Cataracts, Asthma, Sleep Apnea, Deep Vein Thrombosis,  Hypertension,  Peripheral Venous Disease, Osteoarthritis, Received Chemotherapy, Received Radiation] [N/A:N/A] Date Acquired: [5:11/19/2018] [6:01/19/2019] [N/A:N/A] Weeks of Treatment: [5:9] [6:3] [N/A:N/A] Wound Status: [5:Open] [6:Open] [N/A:N/A] Measurements L x W x D [5:3.1x2.6x0.2] [6:1.5x2x0.1] [N/A:N/A] (cm) Area (cm) : [5:6.33] [6:2.356] [N/A:N/A] Volume (cm) : [5:1.266] [6:0.236] [N/A:N/A] % Reduction in Area: [5:25.90%] [6:-970.90%] [N/A:N/A] % Reduction in Volume: [5:-48.10%] [6:-972.70%] [N/A:N/A] Classification: [5:Full Thickness Without Exposed Support Structures] [6:Full Thickness Without Exposed Support Structures] [N/A:N/A] Exudate Amount: [5:Medium] [6:Medium] [N/A:N/A] Exudate Type: [5:Serosanguineous] [6:Serous] [N/A:N/A] Exudate Color: [5:red, brown] [6:amber] [N/A:N/A] Wound Margin: [5:Indistinct, nonvisible] [6:Flat and Intact] [N/A:N/A] Granulation Amount: [5:Medium (34-66%)] [6:Medium (34-66%)] [N/A:N/A] Granulation Quality: [5:Red, Pink] [6:Red]  [N/A:N/A] Necrotic Amount: [5:Medium (34-66%)] [6:Medium (34-66%)] [N/A:N/A] Exposed Structures: [5:Fat Layer (Subcutaneous Tissue) Exposed: Yes Fascia: No Tendon: No Muscle: No Joint: No Bone: No] [6:Fat Layer (Subcutaneous Tissue) Exposed: Yes Fascia: No Tendon: No Muscle: No Joint: No Bone: No] [N/A:N/A] Epithelialization: [5:Small (1-33%)] [6:None] [N/A:N/A] Periwound Skin Texture: Excoriation: No Excoriation: No N/A Induration: No Induration: No Callus: No Callus: No Crepitus: No Crepitus: No Rash: No Rash: No Scarring: No Scarring: No Periwound Skin Moisture: Maceration: Yes Maceration: No N/A Dry/Scaly: No Dry/Scaly: No Periwound Skin Color: Hemosiderin Staining: Yes Hemosiderin Staining: Yes N/A Atrophie Blanche: No Atrophie Blanche: No Cyanosis: No Cyanosis: No Ecchymosis: No Ecchymosis: No Erythema: No Erythema: No Mottled: No Mottled: No Pallor: No Pallor: No Rubor: No Rubor: No Temperature: No Abnormality No Abnormality N/A Tenderness on Palpation: Yes Yes N/A Procedures Performed: Compression Therapy N/A N/A Treatment Notes Wound #5 (Left, Medial Lower Leg) Notes sorbact, abd, unna boot Wound #6 (Left, Lateral Lower Leg) Notes sorbact, abd, unna boot Electronic Signature(s) Signed: 02/11/2019 3:44:34 PM By: Linton Ham MD Entered By: Linton Ham on 02/11/2019 11:50:09 Bradshaw, [name] ([id number]) -------------------------------------------------------------------------------- Multi-Disciplinary Care Plan Details Patient Name: ALAIZA, YAU. Date of Service: 02/11/2019 9:45 AM Medical Record Number: [id number] Patient Account Number: [id number] Date of Birth/Sex: February 06, 1946 (73 y.o. F) Treating RN: Cornell Barman Primary Care Ranika Mcniel: [name] Other Clinician: Referring Twylla Arceneaux: [name] Treating Larrie Lucia/Extender: Tito Dine in Treatment: 9 Active Inactive Orientation to the Wound Care Program Nursing  Diagnoses: Knowledge deficit related to the wound healing center program Goals: Patient/caregiver will verbalize understanding of the [facility] Date Initiated: 12/10/2018 Target Resolution Date: 01/09/2019 Goal Status: Active Interventions: Provide education on orientation to the wound center Notes: Soft Tissue Infection Nursing Diagnoses: Impaired tissue integrity Goals: Patient's soft tissue infection will resolve Date Initiated: 12/10/2018 Target Resolution Date: 01/09/2019 Goal Status: Active Interventions: Assess signs and symptoms of infection every visit Notes: Venous Leg Ulcer Nursing Diagnoses: Actual venous Insuffiency (use after diagnosis is confirmed) Goals: Patient will maintain optimal edema control Date Initiated: 12/10/2018 Target Resolution Date: 01/09/2019 Goal Status: Active Interventions: Assess peripheral edema status every visit. MUSKAAN, SMET ([id number]) Treatment Activities: Therapeutic compression applied : 12/10/2018 Notes: Wound/Skin Impairment Nursing Diagnoses: Impaired tissue integrity Goals: Patient/caregiver will verbalize understanding of skin care regimen Date Initiated: 12/10/2018 Target Resolution Date: 01/09/2019 Goal Status: Active Interventions: Assess ulceration(s) every visit Treatment Activities: Topical wound management initiated : 12/10/2018 Notes: Electronic Signature(s) Signed: 02/11/2019 4:47:28 PM By: [name], [name], RN, CWS, [name], [name] Entered By: [name], [name], RN, CWS, Kim on 02/11/2019 10:15:17 [name] ([id number]) -------------------------------------------------------------------------------- Pain Assessment Details Patient Name: JAILYN, LEESON. Date of Service: 02/11/2019 9:45 AM Medical Record Number: [id number] Patient Account Number: [id number] Date of Birth/Sex: [birth year] (73 y.o. F) Treating RN: Army Melia Primary Care Oriyah Lamphear:  [name] Other Clinician: Referring Bryssa Tones: [name] Treating Corsica Franson/Extender: Tito Dine in Treatment: 9 Active Problems Location of Pain Severity and Description of Pain Patient Has Paino Yes Site Locations Pain Location: Pain in Ulcers With Dressing Change: Yes Duration of the Pain. Constant / Intermittento Constant Rate the pain. Current Pain Level: 4 Character of Pain Describe the Pain: Burning Pain Management and Medication Current Pain Management: Electronic Signature(s) Signed: 02/11/2019 12:01:06 PM By: Army Melia Entered By: Army Melia on 02/11/2019 09:53:08 Hecht, [name] ([id number]) -------------------------------------------------------------------------------- Patient/Caregiver Education Details Patient Name: ALISSE, TUITE. Date of Service: 02/11/2019 9:45 AM Medical Record Number: [id number] Patient Account Number: [id number] Date of Birth/Gender: 1946/05/24 (73 y.o. F) Treating RN: Cornell Barman Primary Care Physician: [name] Other Clinician: Referring Physician: [name] Treating Physician/Extender: Tito Dine in Treatment: 9 Education Assessment Education Provided To: Patient Education Topics Provided Venous: Handouts: Controlling Swelling with Multilayered Compression Wraps Methods: Demonstration, Explain/Verbal Responses: State content correctly Electronic Signature(s) Signed: 02/11/2019 4:47:28 PM By: [name], [name], RN, CWS, [name], [name] Entered By: [name], [name], RN, CWS, Kim on 02/11/2019 10:20:47 [name] ([id number]) -------------------------------------------------------------------------------- Wound Assessment Details Patient Name: BRENTLEE, SCIARA. Date of Service: 02/11/2019 9:45 AM Medical Record Number: [id number] Patient Account Number: [id number] Date of Birth/Sex: 13-Sep-1946 (73 y.o. F) Treating RN: Army Melia Primary Care Alany Borman: [name] Other Clinician: Referring Lindie Roberson: [name] Treating Kimarion Chery/Extender:  Tito Dine in Treatment: 9 Wound Status Wound Number: 5 Primary Lymphedema Etiology: Wound Location: Left Lower Leg - Medial Wound Open Wounding Event: Gradually Appeared Status: Date Acquired: 11/19/2018 Comorbid Cataracts, Asthma, Sleep Apnea, Deep Vein Weeks Of Treatment: 9 History: Thrombosis, Hypertension, Peripheral Venous Clustered Wound: No Disease, Osteoarthritis, Received Chemotherapy, Received Radiation Photos Photo Uploaded By: Army Melia on 02/11/2019 12:47:05 Wound Measurements Length: (cm) 3.1 Width: (cm) 2.6 Depth: (cm) 0.2 Area: (cm) 6.33 Volume: (cm) 1.266 % Reduction in Area: 25.9% % Reduction in Volume: -48.1% Epithelialization: Small (1-33%) Tunneling: No Undermining: No Wound Description Full Thickness Without Exposed Support Classification: Structures Wound Margin: Indistinct, nonvisible Exudate Medium Amount: Exudate Type: Serosanguineous Exudate Color: red, brown Foul Odor After Cleansing: No Slough/Fibrino Yes Wound Bed Granulation Amount: Medium (34-66%) Exposed Structure Granulation Quality: Red, Pink Fascia Exposed: No Necrotic Amount: Medium (34-66%) Fat Layer (Subcutaneous Tissue) Exposed: Yes Necrotic Quality: Adherent Slough Tendon Exposed: No Muscle Exposed: No Joint Exposed: No YANELLY, CANTRELLE. ([id number]) Bone Exposed: No Periwound Skin Texture Texture Color No Abnormalities Noted: No No Abnormalities Noted: No Callus: No Atrophie Blanche: No Crepitus: No Cyanosis: No Excoriation: No Ecchymosis: No Induration: No Erythema: No Rash: No Hemosiderin Staining: Yes Scarring: No Mottled: No Pallor: No Moisture Rubor: No No Abnormalities Noted: No Dry / Scaly: No Temperature / Pain Maceration: Yes Temperature: No Abnormality Tenderness on Palpation: Yes Treatment Notes Wound #5 (Left, Medial Lower Leg) Notes sorbact, abd, unna boot Electronic Signature(s) Signed: 02/11/2019 12:01:06 PM By:  Army Melia Entered By: Army Melia on 02/11/2019 10:00:37 [name] ([id number]) -------------------------------------------------------------------------------- Wound Assessment Details Patient Name: MELINNA, LINAREZ. Date of Service: 02/11/2019 9:45 AM Medical Record Number: [id number] Patient Account Number: [id number] Date of Birth/Sex: 02/05/1946 (73 y.o. F) Treating RN: Army Melia Primary Care Cleaven Demario: [name] Other Clinician: Referring Deshun Sedivy: [name] Treating Zamariyah Furukawa/Extender: [name] Dillon Weeks in Treatment: 9 Wound Status Wound Number: 6 Primary Venous Leg Ulcer Etiology: Wound Location: Left Lower Leg - Lateral Wound Open Wounding Event: Gradually Appeared Status: Date Acquired: 01/19/2019  Comorbid Cataracts, Asthma, Sleep Apnea, Deep Vein Weeks Of Treatment: 3 History: Thrombosis, Hypertension, Peripheral Venous Clustered Wound: No Disease, Osteoarthritis, Received Chemotherapy, Received Radiation Photos Photo Uploaded By: Army Melia on 02/11/2019 12:47:05 Wound Measurements Length: (cm) 1.5 Width: (cm) 2 Depth: (cm) 0.1 Area: (cm) 2.356 Volume: (cm) 0.236 % Reduction in Area: -970.9% % Reduction in Volume: -972.7% Epithelialization: None Tunneling: No Undermining: No Wound Description Full Thickness Without Exposed Support Classification: Structures Wound Margin: Flat and Intact Exudate Medium Amount: Exudate Type: Serous Exudate Color: amber Foul Odor After Cleansing: No Slough/Fibrino Yes Wound Bed Granulation Amount: Medium (34-66%) Exposed Structure Granulation Quality: Red Fascia Exposed: No Necrotic Amount: Medium (34-66%) Fat Layer (Subcutaneous Tissue) Exposed: Yes Necrotic Quality: Adherent Slough Tendon Exposed: No Muscle Exposed: No Joint Exposed: No ROXANN, VIERRA. ([id number]) Bone Exposed: No Periwound Skin Texture Texture Color No Abnormalities Noted: No No Abnormalities Noted:  No Callus: No Atrophie Blanche: No Crepitus: No Cyanosis: No Excoriation: No Ecchymosis: No Induration: No Erythema: No Rash: No Hemosiderin Staining: Yes Scarring: No Mottled: No Pallor: No Moisture Rubor: No No Abnormalities Noted: No Dry / Scaly: No Temperature / Pain Maceration: No Temperature: No Abnormality Tenderness on Palpation: Yes Treatment Notes Wound #6 (Left, Lateral Lower Leg) Notes sorbact, abd, unna boot Electronic Signature(s) Signed: 02/11/2019 12:01:06 PM By: Army Melia Entered By: Army Melia on 02/11/2019 10:00:55 [name] ([id number]) -------------------------------------------------------------------------------- Vitals Details Patient Name: CHIANA, WAMSER. Date of Service: 02/11/2019 9:45 AM Medical Record Number: [id number] Patient Account Number: [id number] Date of Birth/Sex: 1945-12-10 (73 y.o. F) Treating RN: Army Melia Primary Care Fredna Stricker: [name] Other Clinician: Referring Darryle Dennie: [name] Treating Shakai Dolley/Extender: Tito Dine in Treatment: 9 Vital Signs Time Taken: 09:53 Temperature (F): 98.0 Height (in): 63 Pulse (bpm): 79 Weight (lbs): 224.7 Respiratory Rate (breaths/min): 16 Body Mass Index (BMI): 39.8 Blood Pressure (mmHg): 154/74 Reference Range: 80 - 120 mg / dl Electronic Signature(s) Signed: 02/11/2019 12:01:06 PM By: Army Melia Entered By: Army Melia on 02/11/2019 09:53:20

## 2019-02-11 NOTE — Progress Notes (Signed)
HAN, LYSNE ([id number]) Visit Report for 02/11/2019 HPI Details Patient Name: [name], [name]. Date of Service: 02/11/2019 9:45 AM Medical Record Number: [id number] Patient Account Number: [id number] Date of Birth/Sex: Jun 25, 1946 (73 y.o. F) Treating RN: Cornell Barman Primary Care Provider: [name] Other Clinician: Referring Provider: [name] Treating Provider/Extender: Tito Dine in Treatment: 9 History of Present Illness HPI Description: Pleasant 73 year old with history of chronic venous insufficiency. No diabetes or peripheral vascular disease. Left ABI 1.29. Questionable history of left lower extremity DVT. She developed a recurrent ulceration on her left lateral calf in December 2015, which she attributes to poor diet and subsequent lower extremity edema. She underwent endovenous laser ablation of her left greater saphenous vein in 2010. She underwent laser ablation of accessory branch of left GSV in April 2016 by Dr. Kellie Simmering at [facility]. She was previously wearing Unna boots, which she tolerated well. Tolerating 2 layer compression and cadexomer iodine. She returns to clinic for follow-up and is without new complaints. She denies any significant pain at this time. She reports persistent pain with pressure. No claudication or ischemic rest pain. No fever or chills. No drainage. READMISSION 11/13/16; this is a 73 year old woman who is not a diabetic. She is here for a review of a painful area on her left medial lower extremity. I note that she was seen here previously last year for wound I believe to be in the same area. At that time she had undergone previously a left greater saphenous vein ablation by Dr. Kellie Simmering and she had a ablation of the anterior accessory branch of the left greater saphenous vein in March 2016. Seeing that the wound actually closed over. In reviewing the history with her today the ulcer in this area has been recurrent. She describes a  biopsy of this area in 2009 that only showed stasis physiology. 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 Do not see that she has ever had arterial studies in the left leg. She tells me she has a follow-up with Dr. Kellie Simmering in roughly 10 days In any case she developed the reopening of this area roughly a month ago. On the background of this she describes rapidly increasing edema which has responded to Lasix 40 mg and metolazone 2.5 mg as well as the patient's lymph massage. She has been told she has both venous insufficiency and lymphedema but she cannot tolerate compression stockings 11/28/16; the patient saw Dr. Kellie Simmering recently. Per the patient he did arterial Dopplers in the office that did not show evidence of arterial insufficiency, per the patient he stated "treat this like an ordinary venous ulcer". She also saw her dermatologist Dr. Ronnald Ramp who felt that this was more of a vascular ulcer. In general things are improving although she arrives today with increasing bilateral lower extremity edema with weeping a deeper fluid through the wound on the left medial leg compatible with some degree of lymphedema 12/04/16; the patient's wound is fully epithelialized but I don't think fully healed. We will do another week of depression with Promogran and TCA however I suspect we'll be able to discharge her next week. This is a very unusual-looking wound which was initially a figure-of-eight type wound lying on its side surrounded by petechial like hemorrhage. She  has had venous ablation on this side. She apparently does not have an arterial issue per Dr. Kellie Simmering. She saw her dermatologist thought it was "vascular". Patient is definitely going to need ongoing compression and I talked about  this with her today she will go to elastic therapy after she leaves here next week 12/11/16; the patient's wound is not completely closed today. She has surrounding scar tissue and in further discussion with the patient it would appear that she had ulcers in this area in 2009 for a prolonged period of time ultimately requiring a punch biopsy of this area that only showed venous insufficiency. I did not previously pickup on this part of the history from the patient. 12/18/16; the patient's wound is completely epithelialized. There is no open area here. She has significant bilateral venous insufficiency with secondary lymphedema to a mild-to-moderate degree she does not have compression stockings.. She did not say anything to me when I was in the room, she told our intake nurse that she was still having pain in this area. This isn't KITA, NEACE ([id number]) unusual recurrent small open area. She is going to go to elastic therapy to obtain compression stockings. 12/25/16; the patient's wound is fully epithelialized. There is no open area here. The patient describes some continued episodic discomfort in this area medial left calf. However everything looks fine and healed here. She is been to elastic therapy and caught herself 15-20 mmHg stockings, they apparently were having trouble getting 20-30 mm stockings in her size 01/22/17; this is a patient we discharged from the clinic a month ago. She has a recurrent open wound on her medial left calf. She had 15 mm support stockings. I told her I thought she needed 20-30 mm compression stockings. She tells me that she has been ill with hospitalization secondary to asthma and is been found to have severe hypokalemia likely secondary to a combination of Lasix and metolazone. This morning she noted blistering and leaking fluid on the posterior part of her left leg. She called our intake nurse urgently and we was saw her this afternoon. She has not had any real  discomfort here. I don't know that she's been wearing any stockings on this leg for at least 2-3 days. ABIs in this clinic were 1.21 on the right and 1.3 on the left. She is previously seen vascular surgery who does not think that there is a peripheral arterial issue. 01/30/17; Patient arrives with no open wound on the left leg. She has been to elastic therapy and obtained 20-17mmhg below knee stockings and she has one on the right leg today. READMISSION 02/19/18; this Kelsay is a now 74 year old patient we've had in this clinic perhaps 3 times before. I had last looked at her from January 07 December 2016 with an area on the medial left leg. We discharged her on 12/25/16 however she had to be readmitted on 01/22/17 with a recurrence. I have in my notes that we discharged her on 20-30 mm stockings although she tells me she was only wearing support hose because she cannot get stockings on predominantly related to her cervical spine surgery/issues. She has had previous ablations done by vein and vascular in [place] including a great saphenous vein ablation on the left with an anterior accessory branch ablation I think both of these were in 2016. On one of the previous visit she had a biopsy noted 2009 that was negative. She is not felt to have an arterial issue. She is not  a diabetic. She does have a history of obstructive sleep apnea hypertension asthma as well as chronic venous insufficiency and lymphedema. On this occasion she noted 2 dry scaly patch on her left leg. She tried to put lotion on this it didn't really help. There were 2 open areas.the patient has been seeing her primary physician from 02/05/18 through 02/14/18. She had Unna boots applied. The superior wound now on the lateral left leg has closed but she's had one wound that remains open on the lateral left leg. This is not the same spot as we dealt with in 2018. ABIs in this clinic were 1.3 bilaterally 02/26/18; patient has a small wound  on the left lateral calf. Dimensions are down. She has chronic venous insufficiency and lymphedema. 03/05/18; small open area on the left lateral calf. Dimensions are down. Tightly adherent necrotic debris over the surface of the wound which was difficult to remove. Also the dressing [over collagen] stuck to the wound surface. This was removed with some difficulty as well. Change the primary dressing to Hydrofera Blue ready 03/12/18; small open area on the left lateral calf. Comes in with tightly adherent surface eschar as well as some adherent Hydrofera Blue. 03/19/18; open area on the left lateral calf. Again adherent surface eschar as well as some adherent Hydrofera Blue nonviable subcutaneous tissue. She complained of pain all week even with the reduction from 4-3 layer compression I put on last week. Also she had an increase in her ankle and calf measurements probably related to the same thing. 03/26/18; open area on the left lateral calf. A very small open area remains here. We used silver alginate starting last week as the Hydrofera Blue seem to stick to the wound bed. In using 4-layer compression 04/02/18; the open area in the left lateral calf at some adherent slough which I removed there is no open area here. We are able to transition her into her own compression stocking. Truthfully I think this is probably his support hose. However this does not maintain skin integrity will be limited. She cannot put over the toe compression stockings on because of neck problems hand problems etc. She is allergic to the lining layer of juxta lites. We might be forced to use extremitease stocking should this fail READMIT 11/24/2018 Patient is now a 73 year old woman who is not a diabetic. She has been in this clinic on at least 3 previous occasions largely with recurrent wounds on her left leg secondary to chronic venous insufficiency with secondary lymphedema. Her situation is complicated by inability to get  stockings on and an allergy to neoprene which is apparently a component and at least juxta lites and other stockings. As a result she really has not been wearing any stockings on her legs. She tells [place] that roughly 2 or 3 weeks ago she started noticing a stinging sensation just above her ankle on the left medial aspect. She has been diagnosed with pseudogout and she wondered whether this was what she was experiencing. She tried to dress this with something she bought at the store however subsequently it pulled skin off and now she has an open wound that is not improving. She has been using Vaseline gauze with a cover bandage. She saw her primary doctor last week who WYLODEAN, SHIMMEL ([id number]) put an Unna boot on her. ABIs in this clinic was 1.03 on the left 2/12; the area is on the left medial ankle. Odd-looking wound with what looks to be surface epithelialization  but a multitude of small petechial openings. This clearly not closed yet. We have been using silver alginate under 3 layer compression with TCA 2/19; the wound area did not look quite as good this week. Necrotic debris over the majority of the wound surface which required debridement. She continues to have a multitude of what looked to be small petechial openings. She reminds [place] that she had a biopsy on this initially during her first outbreak in 2015 in [place] [facility]. She expresses concern about this being a possible melanoma. 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 I assured her that this does not look anything like melanoma. Except for the petechial reaction it does look like a venous insufficiency area and she certainly has evidence of this on both sides 2/26; a difficult area on the left medial ankle. The patient clearly has chronic venous hypertension with some degree of lymphedema. The odd thing about the area is the small  petechial hemorrhages. I am not really sure how to explain this. This was present last time and this is not a compression injury. We have been using Hydrofera Blue which I changed to last week 3/4; still using Hydrofera Blue. Aggressive debridement today. She does not have known arterial issues. She has seen Dr. Kellie Simmering at [facility] and and has an ablation on the left. [Anterior accessory branch of the greater saphenous]. From what I remember they did not feel she had an arterial issue. The patient has had this area biopsied in 2009 at [facility] dermatology and by her recollection they said this was "stasis". She is also follow-up with dermatology locally who thought that this was more of a vascular issue 3/11; using Hydrofera Blue. Aggressive debridement today. She does not have an arterial issue. We are using 3 layer compression although we may need to go to 4. The patient has been in for multiple changes to her wrap since I last saw her a week ago. She says that the area was leaking. I do not have too much more information on what was found 01/19/19 on evaluation today patient was actually being seen for a nurse visit when unfortunately she had the area on her left lateral lower extremity as well as weeping from the right lower extremity that became apparent. Therefore we did end up actually seeing her for a full visit with myself. She is having some pain at this site as well but fortunately nothing too significant at this point. No fevers, chills, nausea, or vomiting noted at this time. 3/18-Patient is back to the clinic with the left leg venous leg ulcer, the ulcer is larger in size, has a surface that is densely adherent with fibrinous tissue, the Hydrofera Blue was used but is densely adherent and there was difficulty in removing it. The right lower extremity was also wrapped for weeping edema. Patient has a new area over the left lateral foot above the malleolus that is small  and appears to have no debris with intact surrounding skin. Patient is on increased dose of Lasix also as a means to edema management 3/25; the patient has a nonhealing venous ulcer on the medial left leg and last week developed a smaller area on the lateral left calf. We have been using Hydrofera Blue with a contact layer. 4/1; no major change in these wounds areas. Left medial and more recently left lateral calf. I tried Iodoflex last week  to aid in debridement she did not tolerate this. She stated her pain was terrible all week. She took the top layer of the 4 layer compression off. 4/8; the patient actually looks somewhat better in terms of her more prominent left lateral calf wound. There is some healthy looking tissue here. She is still complaining of a lot of discomfort. Electronic Signature(s) Signed: 02/11/2019 3:44:34 PM By: Linton Ham MD Entered By: Linton Ham on 02/11/2019 11:50:59 Renegar, [name] ([id number]) -------------------------------------------------------------------------------- Physical Exam Details Patient Name: [name], [name]. Date of Service: 02/11/2019 9:45 AM Medical Record Number: [id number] Patient Account Number: [id number] Date of Birth/Sex: [birth year] (73 y.o. F) Treating RN: Cornell Barman Primary Care Provider: [name] Other Clinician: Referring Provider: [name] Treating Provider/Extender: Tito Dine in Treatment: 9 Constitutional Patient is hypertensive.. Pulse regular and within target range for patient.Marland Kitchen Respirations regular, non-labored and within target range.. Temperature is normal and within the target range for the patient.Marland Kitchen appears in no distress. Eyes Conjunctivae clear. No discharge. Respiratory Respiratory effort is easy and symmetric bilaterally. Rate is normal at rest and on room air.. Cardiovascular We have excellent edema control. Lymphatic None palpable in the left popliteal or inguinal  area. Psychiatric No evidence of depression, anxiety, or agitation. Calm, cooperative, and communicative. Appropriate interactions and affect.. Notes Wound exam; left medial calf. There is some improvement in the surface here with some better looking granulation. Still greater than 50% of the central wound areas covered by tightly adherent debris. I did not debride this today. oThe area on the lateral calf is a smaller wound still with adherent debris. I did not debride this either. Electronic Signature(s) Signed: 02/11/2019 3:44:34 PM By: Linton Ham MD Entered By: Linton Ham on 02/11/2019 11:52:25 [name] ([id number]) -------------------------------------------------------------------------------- Physician Orders Details Patient Name: CHAUNTAE, HULTS. Date of Service: 02/11/2019 9:45 AM Medical Record Number: [id number] Patient Account Number: [id number] Date of Birth/Sex: [birth year] (73 y.o. F) Treating RN: Cornell Barman Primary Care Provider: [name] Other Clinician: Referring Provider: [name] Treating Provider/Extender: Tito Dine in Treatment: 9 Verbal / Phone Orders: No Diagnosis Coding Wound Cleansing Wound #5 Left,Medial Lower Leg o Clean wound with Normal Saline. o May Shower, gently pat wound dry prior to applying new dressing. Wound #6 Left,Lateral Lower Leg o Clean wound with Normal Saline. o May Shower, gently pat wound dry prior to applying new dressing. Anesthetic (add to Medication List) Wound #5 Left,Medial Lower Leg o Topical Lidocaine 4% cream applied to wound bed prior to debridement (In Clinic Only). Wound #6 Left,Lateral Lower Leg o Topical Lidocaine 4% cream applied to wound bed prior to debridement (In Clinic Only). Skin Barriers/Peri-Wound Care Wound #5 Left,Medial Lower Leg o Triamcinolone Acetonide Ointment (TCA) - on wound and peri-wound Wound #6 Left,Lateral Lower Leg o Triamcinolone  Acetonide Ointment (TCA) - on wound and peri-wound Primary Wound Dressing Wound #5 Left,Medial Lower Leg o Other: - Sorbact Wound #6 Left,Lateral Lower Leg o Other: - Sorbact Secondary Dressing Wound #5 Left,Medial Lower Leg o ABD pad Wound #6 Left,Lateral Lower Leg o ABD pad Dressing Change Frequency Wound #5 Left,Medial Lower Leg o Change dressing every week Wound #6 Left,Lateral Lower Leg o Change dressing every week SAKIRA, DAHMER ([id number]) Follow-up Appointments Wound #5 Left,Medial Lower Leg o Return Appointment in 1 week. o Nurse Visit as needed Wound #6 Left,Lateral Lower Leg o Return Appointment in 1 week. o Nurse Visit as needed Edema Control Wound #5 Left,Medial Lower Leg   o Unna Boot to Left Lower Extremity - ABD up shin for comfort Wound #6 Left,Lateral Lower Leg o Unna Boot to Left Lower Extremity - ABD up shin for comfort Off-Loading Wound #5 Left,Medial Lower Leg o Other: - Elevate legs as needed Wound #6 Left,Lateral Lower Leg o Other: - Elevate legs as needed Medications-please add to medication list. Wound #5 Left,Medial Lower Leg o Other: - Tramadol Wound #6 Left,Lateral Lower Leg o Other: - Tramadol Electronic Signature(s) Signed: 02/11/2019 3:44:34 PM By: Linton Ham MD Signed: 02/11/2019 4:47:28 PM By: [name], [name], RN, CWS, [name], [name] Entered By: [name], [name], RN, CWS, Kim on 02/11/2019 10:20:02 [name] ([id number]) -------------------------------------------------------------------------------- Problem List Details Patient Name: [name], [name]. Date of Service: 02/11/2019 9:45 AM Medical Record Number: [id number] Patient Account Number: [id number] Date of Birth/Sex: Sep 10, 1946 (73 y.o. F) Treating RN: Cornell Barman Primary Care Provider: [name] Other Clinician: Referring Provider: [name] Treating Provider/Extender: Tito Dine in Treatment: 9 Active  Problems ICD-10 Evaluated Encounter Code Description Active Date Today Diagnosis L97.221 Non-pressure chronic ulcer of left calf limited to breakdown of 01/07/2019 No Yes skin I87.321 Chronic venous hypertension (idiopathic) with inflammation of 12/10/2018 No Yes right lower extremity I89.0 Lymphedema, not elsewhere classified 12/10/2018 No Yes Inactive Problems Resolved Problems Electronic Signature(s) Signed: 02/11/2019 3:44:34 PM By: Linton Ham MD Entered By: Linton Ham on 02/11/2019 11:49:56 Toman, [name] ([id number]) -------------------------------------------------------------------------------- Progress Note Details Patient Name: [name], [name]. Date of Service: 02/11/2019 9:45 AM Medical Record Number: [id number] Patient Account Number: [id number] Date of Birth/Sex: [birth year] (73 y.o. F) Treating RN: Cornell Barman Primary Care Provider: [name] Other Clinician: Referring Provider: [name] Treating Provider/Extender: Tito Dine in Treatment: 9 Subjective History of Present Illness (HPI) Pleasant 73 year old with history of chronic venous insufficiency. No diabetes or peripheral vascular disease. Left ABI 1.29. Questionable history of left lower extremity DVT. She developed a recurrent ulceration on her left lateral calf in December 2015, which she attributes to poor diet and subsequent lower extremity edema. She underwent endovenous laser ablation of her left greater saphenous vein in 2010. She underwent laser ablation of accessory branch of left GSV in April 2016 by Dr. Kellie Simmering at [facility]. She was previously wearing Unna boots, which she tolerated well. Tolerating 2 layer compression and cadexomer iodine. She returns to clinic for follow-up and is without new complaints. She denies any significant pain at this time. She reports persistent pain with pressure. No claudication or ischemic rest pain. No fever or chills. No  drainage. READMISSION 11/13/16; this is a 73 year old woman who is not a diabetic. She is here for a review of a painful area on her left medial lower extremity. I note that she was seen here previously last year for wound I believe to be in the same area. At that time she had undergone previously a left greater saphenous vein ablation by Dr. Kellie Simmering and she had a ablation of the anterior accessory branch of the left greater saphenous vein in March 2016. Seeing that the wound actually closed over. In reviewing the history with her today the ulcer in this area has been recurrent. She describes a biopsy of this area in 2009 that only showed stasis physiology. 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 Lutricia Feil of oncology and in August of this year she had surgery for cervical spinal stenosis which left her with an improving Horner's syndrome  on the left eye. Do not see that she has ever had arterial studies in the left leg. She tells me she has a follow-up with Dr. Kellie Simmering in roughly 10 days In any case she developed the reopening of this area roughly a month ago. On the background of this she describes rapidly increasing edema which has responded to Lasix 40 mg and metolazone 2.5 mg as well as the patient's lymph massage. She has been told she has both venous insufficiency and lymphedema but she cannot tolerate compression stockings 11/28/16; the patient saw Dr. Kellie Simmering recently. Per the patient he did arterial Dopplers in the office that did not show evidence of arterial insufficiency, per the patient he stated "treat this like an ordinary venous ulcer". She also saw her dermatologist Dr. Ronnald Ramp who felt that this was more of a vascular ulcer. In general things are improving although she arrives today with increasing bilateral lower extremity edema with weeping a deeper fluid through the wound on the left medial leg compatible with some degree  of lymphedema 12/04/16; the patient's wound is fully epithelialized but I don't think fully healed. We will do another week of depression with Promogran and TCA however I suspect we'll be able to discharge her next week. This is a very unusual-looking wound which was initially a figure-of-eight type wound lying on its side surrounded by petechial like hemorrhage. She has had venous ablation on this side. She apparently does not have an arterial issue per Dr. Kellie Simmering. She saw her dermatologist thought it was "vascular". Patient is definitely going to need ongoing compression and I talked about this with her today she will go to elastic therapy after she leaves here next week 12/11/16; the patient's wound is not completely closed today. She has surrounding scar tissue and in further discussion with the patient it would appear that she had ulcers in this area in 2009 for a prolonged period of time ultimately requiring a punch biopsy of this area that only showed venous insufficiency. I did not previously pickup on this part of the history from the patient. 12/18/16; the patient's wound is completely epithelialized. There is no open area here. She has significant bilateral venous insufficiency with secondary lymphedema to a mild-to-moderate degree she does not have compression stockings.. She did not say anything to me when I was in the room, she told our intake nurse that she was still having pain in this area. This isn't unusual recurrent small open area. She is going to go to elastic therapy to obtain compression stockings. 12/25/16; the patient's wound is fully epithelialized. There is no open area here. The patient describes some continued episodic discomfort in this area medial left calf. However everything looks fine and healed here. She is been to elastic therapy and JAHAIRA, EARNHART ([id number]) caught herself 15-20 mmHg stockings, they apparently were having trouble getting 20-30 mm stockings in her  size 01/22/17; this is a patient we discharged from the clinic a month ago. She has a recurrent open wound on her medial left calf. She had 15 mm support stockings. I told her I thought she needed 20-30 mm compression stockings. She tells me that she has been ill with hospitalization secondary to asthma and is been found to have severe hypokalemia likely secondary to a combination of Lasix and metolazone. This morning she noted blistering and leaking fluid on the posterior part of her left leg. She called our intake nurse urgently and we was saw her this afternoon. She has  not had any real discomfort here. I don't know that she's been wearing any stockings on this leg for at least 2-3 days. ABIs in this clinic were 1.21 on the right and 1.3 on the left. She is previously seen vascular surgery who does not think that there is a peripheral arterial issue. 01/30/17; Patient arrives with no open wound on the left leg. She has been to elastic therapy and obtained 20-36mmhg below knee stockings and she has one on the right leg today. READMISSION 02/19/18; this [name] is a now 73 year old patient we've had in this clinic perhaps 3 times before. I had last looked at her from January 07 December 2016 with an area on the medial left leg. We discharged her on 12/25/16 however she had to be readmitted on 01/22/17 with a recurrence. I have in my notes that we discharged her on 20-30 mm stockings although she tells me she was only wearing support hose because she cannot get stockings on predominantly related to her cervical spine surgery/issues. She has had previous ablations done by vein and vascular in [place] including a great saphenous vein ablation on the left with an anterior accessory branch ablation I think both of these were in 2016. On one of the previous visit she had a biopsy noted 2009 that was negative. She is not felt to have an arterial issue. She is not a diabetic. She does have a history of  obstructive sleep apnea hypertension asthma as well as chronic venous insufficiency and lymphedema. On this occasion she noted 2 dry scaly patch on her left leg. She tried to put lotion on this it didn't really help. There were 2 open areas.the patient has been seeing her primary physician from 02/05/18 through 02/14/18. She had Unna boots applied. The superior wound now on the lateral left leg has closed but she's had one wound that remains open on the lateral left leg. This is not the same spot as we dealt with in 2018. ABIs in this clinic were 1.3 bilaterally 02/26/18; patient has a small wound on the left lateral calf. Dimensions are down. She has chronic venous insufficiency and lymphedema. 03/05/18; small open area on the left lateral calf. Dimensions are down. Tightly adherent necrotic debris over the surface of the wound which was difficult to remove. Also the dressing [over collagen] stuck to the wound surface. This was removed with some difficulty as well. Change the primary dressing to Hydrofera Blue ready 03/12/18; small open area on the left lateral calf. Comes in with tightly adherent surface eschar as well as some adherent Hydrofera Blue. 03/19/18; open area on the left lateral calf. Again adherent surface eschar as well as some adherent Hydrofera Blue nonviable subcutaneous tissue. She complained of pain all week even with the reduction from 4-3 layer compression I put on last week. Also she had an increase in her ankle and calf measurements probably related to the same thing. 03/26/18; open area on the left lateral calf. A very small open area remains here. We used silver alginate starting last week as the Hydrofera Blue seem to stick to the wound bed. In using 4-layer compression 04/02/18; the open area in the left lateral calf at some adherent slough which I removed there is no open area here. We are able to transition her into her own compression stocking. Truthfully I think this is  probably his support hose. However this does not maintain skin integrity will be limited. She cannot put over the toe compression stockings  on because of neck problems hand problems etc. She is allergic to the lining layer of juxta lites. We might be forced to use extremitease stocking should this fail READMIT 11/24/2018 Patient is now a 73 year old woman who is not a diabetic. She has been in this clinic on at least 3 previous occasions largely with recurrent wounds on her left leg secondary to chronic venous insufficiency with secondary lymphedema. Her situation is complicated by inability to get stockings on and an allergy to neoprene which is apparently a component and at least juxta lites and other stockings. As a result she really has not been wearing any stockings on her legs. She tells [place] that roughly 2 or 3 weeks ago she started noticing a stinging sensation just above her ankle on the left medial aspect. She has been diagnosed with pseudogout and she wondered whether this was what she was experiencing. She tried to dress this with something she bought at the store however subsequently it pulled skin off and now she has an open wound that is not improving. She has been using Vaseline gauze with a cover bandage. She saw her primary doctor last week who put an Haematologist on her. ABIs in this clinic was 1.03 on the left BULAH, LURIE. ([id number]) 2/12; the area is on the left medial ankle. Odd-looking wound with what looks to be surface epithelialization but a multitude of small petechial openings. This clearly not closed yet. We have been using silver alginate under 3 layer compression with TCA 2/19; the wound area did not look quite as good this week. Necrotic debris over the majority of the wound surface which required debridement. She continues to have a multitude of what looked to be small petechial openings. She reminds [place] that she had a biopsy on this initially during her first  outbreak in 2015 in [place] dermat[facility]. She expresses concern about this being a possible melanoma. 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 I assured her that this does not look anything like melanoma. Except for the petechial reaction it does look like a venous insufficiency area and she certainly has evidence of this on both sides 2/26; a difficult area on the left medial ankle. The patient clearly has chronic venous hypertension with some degree of lymphedema. The odd thing about the area is the small petechial hemorrhages. I am not really sure how to explain this. This was present last time and this is not a compression injury. We have been using Hydrofera Blue which I changed to last week 3/4; still using Hydrofera Blue. Aggressive debridement today. She does not have known arterial issues. She has seen Dr. Kellie Simmering at [facility] vein [facility] vascular and and has an ablation on the left. [Anterior accessory branch of the greater saphenous]. From what I remember they did not feel she had an arterial issue. The patient has had this area biopsied in 2009 at [facility] dermatology and by her recollection they said this was "stasis". She is also follow-up with dermatology locally who thought that this was more of a vascular issue 3/11; using Hydrofera Blue. Aggressive debridement today. She does not have an arterial issue. We are using 3 layer compression although we may need to go to 4. The patient has been in for multiple changes to her wrap since I last saw her a week ago. She says that the area was leaking. I do not have too much  more information on what was found 01/19/19 on evaluation today patient was actually being seen for a nurse visit when unfortunately she had the area on her left lateral lower extremity as well as weeping from the right lower extremity that became apparent. Therefore we did end  up actually seeing her for a full visit with myself. She is having some pain at this site as well but fortunately nothing too significant at this point. No fevers, chills, nausea, or vomiting noted at this time. 3/18-Patient is back to the clinic with the left leg venous leg ulcer, the ulcer is larger in size, has a surface that is densely adherent with fibrinous tissue, the Hydrofera Blue was used but is densely adherent and there was difficulty in removing it. The right lower extremity was also wrapped for weeping edema. Patient has a new area over the left lateral foot above the malleolus that is small and appears to have no debris with intact surrounding skin. Patient is on increased dose of Lasix also as a means to edema management 3/25; the patient has a nonhealing venous ulcer on the medial left leg and last week developed a smaller area on the lateral left calf. We have been using Hydrofera Blue with a contact layer. 4/1; no major change in these wounds areas. Left medial and more recently left lateral calf. I tried Iodoflex last week to aid in debridement she did not tolerate this. She stated her pain was terrible all week. She took the top layer of the 4 layer compression off. 4/8; the patient actually looks somewhat better in terms of her more prominent left lateral calf wound. There is some healthy looking tissue here. She is still complaining of a lot of discomfort. Objective Constitutional Patient is hypertensive.. Pulse regular and within target range for patient.Marland Kitchen Respirations regular, non-labored and within target range.. Temperature is normal and within the target range for the patient.Marland Kitchen appears in no distress. Vitals Time Taken: 9:53 AM, Height: 63 in, Weight: 224.7 lbs, BMI: 39.8, Temperature: 98.0 F, Pulse: 79 bpm, Respiratory Rate: 16 breaths/min, Blood Pressure: 154/74 mmHg. Eyes Conjunctivae clear. No discharge. [name], [name] ([id number]) Respiratory Respiratory  effort is easy and symmetric bilaterally. Rate is normal at rest and on room air.. Cardiovascular We have excellent edema control. Lymphatic None palpable in the left popliteal or inguinal area. Psychiatric No evidence of depression, anxiety, or agitation. Calm, cooperative, and communicative. Appropriate interactions and affect.. General Notes: Wound exam; left medial calf. There is some improvement in the surface here with some better looking granulation. Still greater than 50% of the central wound areas covered by tightly adherent debris. I did not debride this today. The area on the lateral calf is a smaller wound still with adherent debris. I did not debride this either. Integumentary (Hair, Skin) Wound #5 status is Open. Original cause of wound was Gradually Appeared. The wound is located on the Left,Medial Lower Leg. The wound measures 3.1cm length x 2.6cm width x 0.2cm depth; 6.33cm^2 area and 1.266cm^3 volume. There is Fat Layer (Subcutaneous Tissue) Exposed exposed. There is no tunneling or undermining noted. There is a medium amount of serosanguineous drainage noted. The wound margin is indistinct and nonvisible. There is medium (34-66%) red, pink granulation within the wound bed. There is a medium (34-66%) amount of necrotic tissue within the wound bed including Adherent Slough. The periwound skin appearance exhibited: Maceration, Hemosiderin Staining. The periwound skin appearance did not exhibit: Callus, Crepitus, Excoriation, Induration, Rash, Scarring, Dry/Scaly, Atrophie  Blanche, Cyanosis, Ecchymosis, Mottled, Pallor, Rubor, Erythema. Periwound temperature was noted as No Abnormality. The periwound has tenderness on palpation. Wound #6 status is Open. Original cause of wound was Gradually Appeared. The wound is located on the Left,Lateral Lower Leg. The wound measures 1.5cm length x 2cm width x 0.1cm depth; 2.356cm^2 area and 0.236cm^3 volume. There is Fat Layer (Subcutaneous  Tissue) Exposed exposed. There is no tunneling or undermining noted. There is a medium amount of serous drainage noted. The wound margin is flat and intact. There is medium (34-66%) red granulation within the wound bed. There is a medium (34-66%) amount of necrotic tissue within the wound bed including Adherent Slough. The periwound skin appearance exhibited: Hemosiderin Staining. The periwound skin appearance did not exhibit: Callus, Crepitus, Excoriation, Induration, Rash, Scarring, Dry/Scaly, Maceration, Atrophie Blanche, Cyanosis, Ecchymosis, Mottled, Pallor, Rubor, Erythema. Periwound temperature was noted as No Abnormality. The periwound has tenderness on palpation. Assessment Active Problems ICD-10 Non-pressure chronic ulcer of left calf limited to breakdown of skin Chronic venous hypertension (idiopathic) with inflammation of right lower extremity Lymphedema, not elsewhere classified Diagnoses ICD-10 L97.221: Non-pressure chronic ulcer of left calf limited to breakdown of skin I87.321: Chronic venous hypertension (idiopathic) with inflammation of right lower extremity I89.0: Lymphedema, not elsewhere classified KINDA, [name] ([id number]) Procedures Wound #5 Pre-procedure diagnosis of Wound #5 is a Lymphedema located on the Left,Medial Lower Leg . There was a Three Layer Compression Therapy Procedure with a pre-treatment ABI of 1 by Cornell Barman, RN. Post procedure Diagnosis Wound #5: Same as Pre-Procedure Plan Wound Cleansing: Wound #5 Left,Medial Lower Leg: Clean wound with Normal Saline. May Shower, gently pat wound dry prior to applying new dressing. Wound #6 Left,Lateral Lower Leg: Clean wound with Normal Saline. May Shower, gently pat wound dry prior to applying new dressing. Anesthetic (add to Medication List): Wound #5 Left,Medial Lower Leg: Topical Lidocaine 4% cream applied to wound bed prior to debridement (In Clinic Only). Wound #6 Left,Lateral Lower Leg: Topical  Lidocaine 4% cream applied to wound bed prior to debridement (In Clinic Only). Skin Barriers/Peri-Wound Care: Wound #5 Left,Medial Lower Leg: Triamcinolone Acetonide Ointment (TCA) - on wound and peri-wound Wound #6 Left,Lateral Lower Leg: Triamcinolone Acetonide Ointment (TCA) - on wound and peri-wound Primary Wound Dressing: Wound #5 Left,Medial Lower Leg: Other: - Sorbact Wound #6 Left,Lateral Lower Leg: Other: - Sorbact Secondary Dressing: Wound #5 Left,Medial Lower Leg: ABD pad Wound #6 Left,Lateral Lower Leg: ABD pad Dressing Change Frequency: Wound #5 Left,Medial Lower Leg: Change dressing every week Wound #6 Left,Lateral Lower Leg: Change dressing every week Follow-up Appointments: Wound #5 Left,Medial Lower Leg: Return Appointment in 1 week. Nurse Visit as needed Wound #6 Left,Lateral Lower Leg: Return Appointment in 1 week. Nurse Visit as needed Edema Control: Wound #5 Left,Medial Lower Leg: Unna Boot to Left Lower Extremity - ABD up shin for comfort Wound #6 Left,Lateral Lower Leg: ELODIE, PANAMENO ([id number]) Unna Boot to Left Lower Extremity - ABD up shin for comfort Off-Loading: Wound #5 Left,Medial Lower Leg: Other: - Elevate legs as needed Wound #6 Left,Lateral Lower Leg: Other: - Elevate legs as needed Medications-please add to medication list.: Wound #5 Left,Medial Lower Leg: Other: - Tramadol Wound #6 Left,Lateral Lower Leg: Other: - Tramadol 1. I am going to continue with Sorbact. It seems to have done a nice job. She does require mechanical debridement I will try to get her through this next week. 2. Continue with ABDs and Unna boots 3. She is going to require debridement  next week but I was cautiously optimistic that there is some healthy looking granulation tissue especially on the lateral aspect Electronic Signature(s) Signed: 02/11/2019 3:44:34 PM By: Linton Ham MD Entered By: Linton Ham on 02/11/2019 11:54:42 Spratling, [name]  ([id number]) -------------------------------------------------------------------------------- SuperBill Details Patient Name: KHYLA, MCCUMBERS. Date of Service: 02/11/2019 Medical Record Number: [id number] Patient Account Number: [id number] Date of Birth/Sex: 1946/10/12 (73 y.o. F) Treating RN: Cornell Barman Primary Care Provider: [name] Other Clinician: Referring Provider: [name] Treating Provider/Extender: Tito Dine in Treatment: 9 Diagnosis Coding ICD-10 Codes Code Description [phone] Non-pressure chronic ulcer of left calf limited to breakdown of skin I87.321 Chronic venous hypertension (idiopathic) with inflammation of right lower extremity I89.0 Lymphedema, not elsewhere classified Facility Procedures CPT4 Code: [id number] Description: (Facility Use Only) [phone] - [facility] Modifier: Quantity: 1 Physician Procedures CPT4 Code Description: [id number] - WC PHYS LEVEL 3 - EST PT ICD-10 Diagnosis Description L97.221 Non-pressure chronic ulcer of left calf limited to breakdown I87.321 Chronic venous hypertension (idiopathic) with inflammation of Modifier: of skin right lower extr Quantity: 1 emity Electronic Signature(s) Signed: 02/11/2019 3:44:34 PM By: Linton Ham MD Entered By: Linton Ham on 02/11/2019 11:55:09

## 2019-02-13 ENCOUNTER — Other Ambulatory Visit: Payer: Medicare Other

## 2019-02-13 ENCOUNTER — Ambulatory Visit: Payer: Medicare Other | Admitting: Family

## 2019-02-18 ENCOUNTER — Encounter: Payer: Medicare Other | Admitting: Internal Medicine

## 2019-02-18 ENCOUNTER — Other Ambulatory Visit: Payer: Self-pay

## 2019-02-18 DIAGNOSIS — I10 Essential (primary) hypertension: Secondary | ICD-10-CM | POA: Diagnosis not present

## 2019-02-18 DIAGNOSIS — L97221 Non-pressure chronic ulcer of left calf limited to breakdown of skin: Secondary | ICD-10-CM | POA: Diagnosis not present

## 2019-02-18 DIAGNOSIS — I890 Lymphedema, not elsewhere classified: Secondary | ICD-10-CM | POA: Diagnosis not present

## 2019-02-18 DIAGNOSIS — I87302 Chronic venous hypertension (idiopathic) without complications of left lower extremity: Secondary | ICD-10-CM | POA: Diagnosis not present

## 2019-02-18 DIAGNOSIS — I87312 Chronic venous hypertension (idiopathic) with ulcer of left lower extremity: Secondary | ICD-10-CM | POA: Diagnosis not present

## 2019-02-18 DIAGNOSIS — S81802A Unspecified open wound, left lower leg, initial encounter: Secondary | ICD-10-CM | POA: Diagnosis not present

## 2019-02-18 DIAGNOSIS — G4733 Obstructive sleep apnea (adult) (pediatric): Secondary | ICD-10-CM | POA: Diagnosis not present

## 2019-02-18 DIAGNOSIS — J45909 Unspecified asthma, uncomplicated: Secondary | ICD-10-CM | POA: Diagnosis not present

## 2019-02-18 DIAGNOSIS — L97222 Non-pressure chronic ulcer of left calf with fat layer exposed: Secondary | ICD-10-CM | POA: Diagnosis not present

## 2019-02-18 NOTE — Progress Notes (Signed)
[name] ([id number]) Visit Report for 02/18/2019 HPI Details Patient Name: [name], [name]. Date of Service: 02/18/2019 9:45 AM Medical Record Number: [id number] Patient Account Number: [id number] Date of Birth/Sex: 06-30-1946 (73 y.o. F) Treating RN: Cornell Barman Primary Care Provider: [name] Other Clinician: Referring Provider: [name] Treating Provider/Extender: Tito Dine in Treatment: 10 History of Present Illness HPI Description: Pleasant 73 year old with history of chronic venous insufficiency. No diabetes or peripheral vascular disease. Left ABI 1.29. Questionable history of left lower extremity DVT. She developed a recurrent ulceration on her left lateral calf in December 2015, which she attributes to poor diet and subsequent lower extremity edema. She underwent endovenous laser ablation of her left greater saphenous vein in 2010. She underwent laser ablation of accessory branch of left GSV in April 2016 by Dr. Kellie Simmering at [facility]. She was previously wearing Unna boots, which she tolerated well. Tolerating 2 layer compression and cadexomer iodine. She returns to clinic for follow-up and is without new complaints. She denies any significant pain at this time. She reports persistent pain with pressure. No claudication or ischemic rest pain. No fever or chills. No drainage. READMISSION 11/13/16; this is a 73 year old woman who is not a diabetic. She is here for a review of a painful area on her left medial lower extremity. I note that she was seen here previously last year for wound I believe to be in the same area. At that time she had undergone previously a left greater saphenous vein ablation by Dr. Kellie Simmering and she had a ablation of the anterior accessory branch of the left greater saphenous vein in March 2016. Seeing that the wound actually closed over. In reviewing the history with her today the ulcer in this area has been recurrent. She  describes a biopsy of this area in 2009 that only showed stasis physiology. 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 Do not see that she has ever had arterial studies in the left leg. She tells me she has a follow-up with Dr. Kellie Simmering in roughly 10 days In any case she developed the reopening of this area roughly a month ago. On the background of this she describes rapidly increasing edema which has responded to Lasix 40 mg and metolazone 2.5 mg as well as the patient's lymph massage. She has been told she has both venous insufficiency and lymphedema but she cannot tolerate compression stockings 11/28/16; the patient saw Dr. Kellie Simmering recently. Per the patient he did arterial Dopplers in the office that did not show evidence of arterial insufficiency, per the patient he stated "treat this like an ordinary venous ulcer". She also saw her dermatologist Dr. Ronnald Ramp who felt that this was more of a vascular ulcer. In general things are improving although she arrives today with increasing bilateral lower extremity edema with weeping a deeper fluid through the wound on the left medial leg compatible with some degree of lymphedema 12/04/16; the patient's wound is fully epithelialized but I don't think fully healed. We will do another week of depression with Promogran and TCA however I suspect we'll be able to discharge her next week. This is a very unusual-looking wound which was initially a figure-of-eight type wound lying on its side surrounded by petechial like hemorrhage. She  has had venous ablation on this side. She apparently does not have an arterial issue per Dr. Kellie Simmering. She saw her dermatologist thought it was "vascular". Patient is definitely going to need ongoing compression and I  talked about this with her today she will go to elastic therapy after she leaves here next week 12/11/16; the patient's wound is not completely closed today. She has surrounding scar tissue and in further discussion with the patient it would appear that she had ulcers in this area in 2009 for a prolonged period of time ultimately requiring a punch biopsy of this area that only showed venous insufficiency. I did not previously pickup on this part of the history from the patient. 12/18/16; the patient's wound is completely epithelialized. There is no open area here. She has significant bilateral venous insufficiency with secondary lymphedema to a mild-to-moderate degree she does not have compression stockings.. She did not say anything to me when I was in the room, she told our intake nurse that she was still having pain in this area. This isn't [name], [name] ([id number]) unusual recurrent small open area. She is going to go to elastic therapy to obtain compression stockings. 12/25/16; the patient's wound is fully epithelialized. There is no open area here. The patient describes some continued episodic discomfort in this area medial left calf. However everything looks fine and healed here. She is been to elastic therapy and caught herself 15-20 mmHg stockings, they apparently were having trouble getting 20-30 mm stockings in her size 01/22/17; this is a patient we discharged from the clinic a month ago. She has a recurrent open wound on her medial left calf. She had 15 mm support stockings. I told her I thought she needed 20-30 mm compression stockings. She tells me that she has been ill with hospitalization secondary to asthma and is been found to have severe hypokalemia likely secondary to a combination of Lasix and metolazone. This morning she noted blistering and leaking fluid on the posterior part of her left leg. She called our intake nurse urgently and we was saw her this afternoon. She has not  had any real discomfort here. I don't know that she's been wearing any stockings on this leg for at least 2-3 days. ABIs in this clinic were 1.21 on the right and 1.3 on the left. She is previously seen vascular surgery who does not think that there is a peripheral arterial issue. 01/30/17; Patient arrives with no open wound on the left leg. She has been to elastic therapy and obtained 20-71mmhg below knee stockings and she has one on the right leg today. READMISSION 02/19/18; this Kronk is a now 73 year old patient we've had in this clinic perhaps 3 times before. I had last looked at her from January 07 December 2016 with an area on the medial left leg. We discharged her on 12/25/16 however she had to be readmitted on 01/22/17 with a recurrence. I have in my notes that we discharged her on 20-30 mm stockings although she tells me she was only wearing support hose because she cannot get stockings on predominantly related to her cervical spine surgery/issues. She has had previous ablations done by vein and vascular in [place] including a great saphenous vein ablation on the left with an anterior accessory branch ablation I think both of these were in 2016. On one of the previous visit she had a biopsy noted 2009 that was negative. She is not felt to have an arterial issue. She is not  a diabetic. She does have a history of obstructive sleep apnea hypertension asthma as well as chronic venous insufficiency and lymphedema. On this occasion she noted 2 dry scaly patch on her left leg. She tried to put lotion on this it didn't really help. There were 2 open areas.the patient has been seeing her primary physician from 02/05/18 through 02/14/18. She had Unna boots applied. The superior wound now on the lateral left leg has closed but she's had one wound that remains open on the lateral left leg. This is not the same spot as we dealt with in 2018. ABIs in this clinic were 1.3 bilaterally 02/26/18; patient has a  small wound on the left lateral calf. Dimensions are down. She has chronic venous insufficiency and lymphedema. 03/05/18; small open area on the left lateral calf. Dimensions are down. Tightly adherent necrotic debris over the surface of the wound which was difficult to remove. Also the dressing [over collagen] stuck to the wound surface. This was removed with some difficulty as well. Change the primary dressing to Hydrofera Blue ready 03/12/18; small open area on the left lateral calf. Comes in with tightly adherent surface eschar as well as some adherent Hydrofera Blue. 03/19/18; open area on the left lateral calf. Again adherent surface eschar as well as some adherent Hydrofera Blue nonviable subcutaneous tissue. She complained of pain all week even with the reduction from 4-3 layer compression I put on last week. Also she had an increase in her ankle and calf measurements probably related to the same thing. 03/26/18; open area on the left lateral calf. A very small open area remains here. We used silver alginate starting last week as the Hydrofera Blue seem to stick to the wound bed. In using 4-layer compression 04/02/18; the open area in the left lateral calf at some adherent slough which I removed there is no open area here. We are able to transition her into her own compression stocking. Truthfully I think this is probably his support hose. However this does not maintain skin integrity will be limited. She cannot put over the toe compression stockings on because of neck problems hand problems etc. She is allergic to the lining layer of juxta lites. We might be forced to use extremitease stocking should this fail READMIT 11/24/2018 Patient is now a 73 year old woman who is not a diabetic. She has been in this clinic on at least 3 previous occasions largely with recurrent wounds on her left leg secondary to chronic venous insufficiency with secondary lymphedema. Her situation is complicated by  inability to get stockings on and an allergy to neoprene which is apparently a component and at least juxta lites and other stockings. As a result she really has not been wearing any stockings on her legs. She tells [place] that roughly 2 or 3 weeks ago she started noticing a stinging sensation just above her ankle on the left medial aspect. She has been diagnosed with pseudogout and she wondered whether this was what she was experiencing. She tried to dress this with something she bought at the store however subsequently it pulled skin off and now she has an open wound that is not improving. She has been using Vaseline gauze with a cover bandage. She saw her primary doctor last week who [name], [name] ([id number]) put an Unna boot on her. ABIs in this clinic was 1.03 on the left 2/12; the area is on the left medial ankle. Odd-looking wound with what looks to be surface epithelialization  but a multitude of small petechial openings. This clearly not closed yet. We have been using silver alginate under 3 layer compression with TCA 2/19; the wound area did not look quite as good this week. Necrotic debris over the majority of the wound surface which required debridement. She continues to have a multitude of what looked to be small petechial openings. She reminds [place] that she had a biopsy on this initially during her first outbreak in 2015 in [place] [facility]. She expresses concern about this being a possible melanoma. 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 I assured her that this does not look anything like melanoma. Except for the petechial reaction it does look like a venous insufficiency area and she certainly has evidence of this on both sides 2/26; a difficult area on the left medial ankle. The patient clearly has chronic venous hypertension with some degree of lymphedema. The odd thing about the area is  the small petechial hemorrhages. I am not really sure how to explain this. This was present last time and this is not a compression injury. We have been using Hydrofera Blue which I changed to last week 3/4; still using Hydrofera Blue. Aggressive debridement today. She does not have known arterial issues. She has seen Dr. Kellie Simmering at [facility] vein [facility] vascular and and has an ablation on the left. [Anterior accessory branch of the greater saphenous]. From what I remember they did not feel she had an arterial issue. The patient has had this area biopsied in 2009 at [facility] dermatology and by her recollection they said this was "stasis". She is also follow-up with dermatology locally who thought that this was more of a vascular issue 3/11; using Hydrofera Blue. Aggressive debridement today. She does not have an arterial issue. We are using 3 layer compression although we may need to go to 4. The patient has been in for multiple changes to her wrap since I last saw her a week ago. She says that the area was leaking. I do not have too much more information on what was found 01/19/19 on evaluation today patient was actually being seen for a nurse visit when unfortunately she had the area on her left lateral lower extremity as well as weeping from the right lower extremity that became apparent. Therefore we did end up actually seeing her for a full visit with myself. She is having some pain at this site as well but fortunately nothing too significant at this point. No fevers, chills, nausea, or vomiting noted at this time. 3/18-Patient is back to the clinic with the left leg venous leg ulcer, the ulcer is larger in size, has a surface that is densely adherent with fibrinous tissue, the Hydrofera Blue was used but is densely adherent and there was difficulty in removing it. The right lower extremity was also wrapped for weeping edema. Patient has a new area over the left lateral foot above the malleolus  that is small and appears to have no debris with intact surrounding skin. Patient is on increased dose of Lasix also as a means to edema management 3/25; the patient has a nonhealing venous ulcer on the medial left leg and last week developed a smaller area on the lateral left calf. We have been using Hydrofera Blue with a contact layer. 4/1; no major change in these wounds areas. Left medial and more recently left lateral calf. I tried Iodoflex last week  to aid in debridement she did not tolerate this. She stated her pain was terrible all week. She took the top layer of the 4 layer compression off. 4/8; the patient actually looks somewhat better in terms of her more prominent left lateral calf wound. There is some healthy looking tissue here. She is still complaining of a lot of discomfort. 4/15; patient in a lot of pain secondary to sciatica. She is on a prednisone taper prescribed by her primary physician. She has the 2 areas one on the left medial and more recently a smaller area on the left lateral calf. Both of these just above the malleoli Electronic Signature(s) Signed: 02/18/2019 2:01:47 PM By: Linton Ham MD Entered By: Linton Ham on 02/18/2019 10:46:37 Chasteen, [name] ([id number]) -------------------------------------------------------------------------------- Physical Exam Details Patient Name: GLINDA, NATZKE. Date of Service: 02/18/2019 9:45 AM Medical Record Number: [id number] Patient Account Number: [id number] Date of Birth/Sex: [birth year] (73 y.o. F) Treating RN: Cornell Barman Primary Care Provider: [name] Other Clinician: Referring Provider: [name] Treating Provider/Extender: Tito Dine in Treatment: 10 Constitutional Patient is hypertensive.. Pulse regular and within target range for patient.Marland Kitchen Respirations regular, non-labored and within target range.. Temperature is normal and within the target range for the patient.Marland Kitchen appears in no  distress. Eyes Conjunctivae clear. No discharge. Respiratory Respiratory effort is easy and symmetric bilaterally. Rate is normal at rest and on room air.. Cardiovascular Significant evidence of chronic venous hypertension with dilated small veins in her medial and and lateral foot. Lymphatic None palpable in the left popliteal or inguinal area. Integumentary (Hair, Skin) No evidence of surrounding infection. Psychiatric No evidence of depression, anxiety, or agitation. Calm, cooperative, and communicative. Appropriate interactions and affect.. Notes Wound exam; left medial calf. There is really no improvement here this is covered with tightly adherent fibrinous debris. The area laterally is in a very similar condition although the wound area smaller. There is no evidence of surrounding infection. Her peripheral pulses are palpable. Edema control is good Electronic Signature(s) Signed: 02/18/2019 2:01:47 PM By: Linton Ham MD Entered By: Linton Ham on 02/18/2019 10:49:32 Whaling, [name] ([id number]) -------------------------------------------------------------------------------- Physician Orders Details Patient Name: [name], CHARLIE. Date of Service: 02/18/2019 9:45 AM Medical Record Number: [id number] Patient Account Number: [id number] Date of Birth/Sex: 1946/02/11 (74 y.o. F) Treating RN: Cornell Barman Primary Care Provider: [name] Other Clinician: Referring Provider: [name] Treating Provider/Extender: Tito Dine in Treatment: 10 Verbal / Phone Orders: No Diagnosis Coding Wound Cleansing Wound #5 Left,Medial Lower Leg o Clean wound with Normal Saline. o May Shower, gently pat wound dry prior to applying new dressing. Wound #6 Left,Lateral Lower Leg o Clean wound with Normal Saline. o May Shower, gently pat wound dry prior to applying new dressing. Anesthetic (add to Medication List) Wound #5 Left,Medial Lower Leg o Topical  Lidocaine 4% cream applied to wound bed prior to debridement (In Clinic Only). Wound #6 Left,Lateral Lower Leg o Topical Lidocaine 4% cream applied to wound bed prior to debridement (In Clinic Only). Skin Barriers/Peri-Wound Care Wound #5 Left,Medial Lower Leg o Triamcinolone Acetonide Ointment (TCA) - on wound and peri-wound Wound #6 Left,Lateral Lower Leg o Triamcinolone Acetonide Ointment (TCA) - on wound and peri-wound Primary Wound Dressing Wound #5 Left,Medial Lower Leg o Other: - Sorbact Wound #6 Left,Lateral Lower Leg o Other: - Sorbact Secondary Dressing Wound #5 Left,Medial Lower Leg o ABD pad Wound #6 Left,Lateral Lower Leg o ABD pad Dressing Change Frequency Wound #5 Left,Medial Lower Leg   o Change dressing every week Wound #6 Left,Lateral Lower Leg o Change dressing every week DYLLAN, KATS ([id number]) Follow-up Appointments Wound #5 Left,Medial Lower Leg o Return Appointment in 1 week. o Nurse Visit as needed Wound #6 Left,Lateral Lower Leg o Return Appointment in 1 week. o Nurse Visit as needed Edema Control Wound #5 Left,Medial Lower Leg o Unna Boot to Left Lower Extremity - ABD up shin for comfort Wound #6 Left,Lateral Lower Leg o Unna Boot to Left Lower Extremity - ABD up shin for comfort Off-Loading Wound #5 Left,Medial Lower Leg o Other: - Elevate legs as needed Wound #6 Left,Lateral Lower Leg o Other: - Elevate legs as needed Medications-please add to medication list. Wound #5 Left,Medial Lower Leg o Other: - Tramadol Wound #6 Left,Lateral Lower Leg o Other: - Tramadol Electronic Signature(s) Signed: 02/18/2019 2:01:47 PM By: Linton Ham MD Signed: 02/18/2019 3:17:52 PM By: [name], [name], RN, CWS, [name], [name] Entered By: [name], [name], RN, CWS, Kim on 02/18/2019 10:38:13 [name], [name] ([id number]) -------------------------------------------------------------------------------- Problem List Details Patient  Name: SHATERRA, SANZONE. Date of Service: 02/18/2019 9:45 AM Medical Record Number: [id number] Patient Account Number: [id number] Date of Birth/Sex: 1946/06/21 (73 y.o. F) Treating RN: Cornell Barman Primary Care Provider: [name] Other Clinician: Referring Provider: [name] Treating Provider/Extender: Tito Dine in Treatment: 10 Active Problems ICD-10 Evaluated Encounter Code Description Active Date Today Diagnosis L97.221 Non-pressure chronic ulcer of left calf limited to breakdown of 01/07/2019 No Yes skin I87.321 Chronic venous hypertension (idiopathic) with inflammation of 12/10/2018 No Yes right lower extremity I89.0 Lymphedema, not elsewhere classified 12/10/2018 No Yes Inactive Problems Resolved Problems Electronic Signature(s) Signed: 02/18/2019 2:01:47 PM By: Linton Ham MD Entered By: Linton Ham on 02/18/2019 10:45:48 Liou, [name] ([id number]) -------------------------------------------------------------------------------- Progress Note Details Patient Name: TAMIRRA, SIENKIEWICZ. Date of Service: 02/18/2019 9:45 AM Medical Record Number: [id number] Patient Account Number: [id number] Date of Birth/Sex: [birth year] (73 y.o. F) Treating RN: Cornell Barman Primary Care Provider: [name] Other Clinician: Referring Provider: [name] Treating Provider/Extender: Tito Dine in Treatment: 10 Subjective History of Present Illness (HPI) Pleasant 73 year old with history of chronic venous insufficiency. No diabetes or peripheral vascular disease. Left ABI 1.29. Questionable history of left lower extremity DVT. She developed a recurrent ulceration on her left lateral calf in December 2015, which she attributes to poor diet and subsequent lower extremity edema. She underwent endovenous laser ablation of her left greater saphenous vein in 2010. She underwent laser ablation of accessory branch of left GSV in April 2016 by Dr. Kellie Simmering  at [facility]. She was previously wearing Unna boots, which she tolerated well. Tolerating 2 layer compression and cadexomer iodine. She returns to clinic for follow-up and is without new complaints. She denies any significant pain at this time. She reports persistent pain with pressure. No claudication or ischemic rest pain. No fever or chills. No drainage. READMISSION 11/13/16; this is a 74 year old woman who is not a diabetic. She is here for a review of a painful area on her left medial lower extremity. I note that she was seen here previously last year for wound I believe to be in the same area. At that time she had undergone previously a left greater saphenous vein ablation by Dr. Kellie Simmering and she had a ablation of the anterior accessory branch of the left greater saphenous vein in March 2016. Seeing that the wound actually closed over. In reviewing the history with her today the ulcer in this area  has been recurrent. She describes a biopsy of this area in 2009 that only showed stasis physiology. 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 Do not see that she has ever had arterial studies in the left leg. She tells me she has a follow-up with Dr. Kellie Simmering in roughly 10 days In any case she developed the reopening of this area roughly a month ago. On the background of this she describes rapidly increasing edema which has responded to Lasix 40 mg and metolazone 2.5 mg as well as the patient's lymph massage. She has been told she has both venous insufficiency and lymphedema but she cannot tolerate compression stockings 11/28/16; the patient saw Dr. Kellie Simmering recently. Per the patient he did arterial Dopplers in the office that did not show evidence of arterial insufficiency, per the  patient he stated "treat this like an ordinary venous ulcer". She also saw her dermatologist Dr. Ronnald Ramp who felt that this was more of a vascular ulcer. In general things are improving although she arrives today with increasing bilateral lower extremity edema with weeping a deeper fluid through the wound on the left medial leg compatible with some degree of lymphedema 12/04/16; the patient's wound is fully epithelialized but I don't think fully healed. We will do another week of depression with Promogran and TCA however I suspect we'll be able to discharge her next week. This is a very unusual-looking wound which was initially a figure-of-eight type wound lying on its side surrounded by petechial like hemorrhage. She has had venous ablation on this side. She apparently does not have an arterial issue per Dr. Kellie Simmering. She saw her dermatologist thought it was "vascular". Patient is definitely going to need ongoing compression and I talked about this with her today she will go to elastic therapy after she leaves here next week 12/11/16; the patient's wound is not completely closed today. She has surrounding scar tissue and in further discussion with the patient it would appear that she had ulcers in this area in 2009 for a prolonged period of time ultimately requiring a punch biopsy of this area that only showed venous insufficiency. I did not previously pickup on this part of the history from the patient. 12/18/16; the patient's wound is completely epithelialized. There is no open area here. She has significant bilateral venous insufficiency with secondary lymphedema to a mild-to-moderate degree she does not have compression stockings.. She did not say anything to me when I was in the room, she told our intake nurse that she was still having pain in this area. This isn't unusual recurrent small open area. She is going to go to elastic therapy to obtain compression stockings. 12/25/16; the patient's wound is  fully epithelialized. There is no open area here. The patient describes some continued episodic discomfort in this area medial left calf. However everything looks fine and healed here. She is been to elastic therapy and [name], [name] ([id number]) caught herself 15-20 mmHg stockings, they apparently were having trouble getting 20-30 mm stockings in her size 01/22/17; this is a patient we discharged from the clinic a month ago. She has a recurrent open wound on her medial left calf. She had 15 mm support stockings. I told her I thought she needed 20-30 mm compression stockings. She  tells me that she has been ill with hospitalization secondary to asthma and is been found to have severe hypokalemia likely secondary to a combination of Lasix and metolazone. This morning she noted blistering and leaking fluid on the posterior part of her left leg. She called our intake nurse urgently and we was saw her this afternoon. She has not had any real discomfort here. I don't know that she's been wearing any stockings on this leg for at least 2-3 days. ABIs in this clinic were 1.21 on the right and 1.3 on the left. She is previously seen vascular surgery who does not think that there is a peripheral arterial issue. 01/30/17; Patient arrives with no open wound on the left leg. She has been to elastic therapy and obtained 20-5mmhg below knee stockings and she has one on the right leg today. READMISSION 02/19/18; this Honda is a now 73 year old patient we've had in this clinic perhaps 3 times before. I had last looked at her from January 07 December 2016 with an area on the medial left leg. We discharged her on 12/25/16 however she had to be readmitted on 01/22/17 with a recurrence. I have in my notes that we discharged her on 20-30 mm stockings although she tells me she was only wearing support hose because she cannot get stockings on predominantly related to her cervical spine surgery/issues. She has had previous  ablations done by vein and vascular in [place] including a great saphenous vein ablation on the left with an anterior accessory branch ablation I think both of these were in 2016. On one of the previous visit she had a biopsy noted 2009 that was negative. She is not felt to have an arterial issue. She is not a diabetic. She does have a history of obstructive sleep apnea hypertension asthma as well as chronic venous insufficiency and lymphedema. On this occasion she noted 2 dry scaly patch on her left leg. She tried to put lotion on this it didn't really help. There were 2 open areas.the patient has been seeing her primary physician from 02/05/18 through 02/14/18. She had Unna boots applied. The superior wound now on the lateral left leg has closed but she's had one wound that remains open on the lateral left leg. This is not the same spot as we dealt with in 2018. ABIs in this clinic were 1.3 bilaterally 02/26/18; patient has a small wound on the left lateral calf. Dimensions are down. She has chronic venous insufficiency and lymphedema. 03/05/18; small open area on the left lateral calf. Dimensions are down. Tightly adherent necrotic debris over the surface of the wound which was difficult to remove. Also the dressing [over collagen] stuck to the wound surface. This was removed with some difficulty as well. Change the primary dressing to Hydrofera Blue ready 03/12/18; small open area on the left lateral calf. Comes in with tightly adherent surface eschar as well as some adherent Hydrofera Blue. 03/19/18; open area on the left lateral calf. Again adherent surface eschar as well as some adherent Hydrofera Blue nonviable subcutaneous tissue. She complained of pain all week even with the reduction from 4-3 layer compression I put on last week. Also she had an increase in her ankle and calf measurements probably related to the same thing. 03/26/18; open area on the left lateral calf. A very small open area  remains here. We used silver alginate starting last week as the Hydrofera Blue seem to stick to the wound bed. In using 4-layer compression  04/02/18; the open area in the left lateral calf at some adherent slough which I removed there is no open area here. We are able to transition her into her own compression stocking. Truthfully I think this is probably his support hose. However this does not maintain skin integrity will be limited. She cannot put over the toe compression stockings on because of neck problems hand problems etc. She is allergic to the lining layer of juxta lites. We might be forced to use extremitease stocking should this fail READMIT 11/24/2018 Patient is now a 74 year old woman who is not a diabetic. She has been in this clinic on at least 3 previous occasions largely with recurrent wounds on her left leg secondary to chronic venous insufficiency with secondary lymphedema. Her situation is complicated by inability to get stockings on and an allergy to neoprene which is apparently a component and at least juxta lites and other stockings. As a result she really has not been wearing any stockings on her legs. She tells [place] that roughly 2 or 3 weeks ago she started noticing a stinging sensation just above her ankle on the left medial aspect. She has been diagnosed with pseudogout and she wondered whether this was what she was experiencing. She tried to dress this with something she bought at the store however subsequently it pulled skin off and now she has an open wound that is not improving. She has been using Vaseline gauze with a cover bandage. She saw her primary doctor last week who put an Haematologist on her. ABIs in this clinic was 1.03 on the left [name], [name]. ([id number]) 2/12; the area is on the left medial ankle. Odd-looking wound with what looks to be surface epithelialization but a multitude of small petechial openings. This clearly not closed yet. We have been using  silver alginate under 3 layer compression with TCA 2/19; the wound area did not look quite as good this week. Necrotic debris over the majority of the wound surface which required debridement. She continues to have a multitude of what looked to be small petechial openings. She reminds [place] that she had a biopsy on this initially during her first outbreak in 2015 in [place] dermatology. She expresses concern about this being a possible melanoma. 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 I assured her that this does not look anything like melanoma. Except for the petechial reaction it does look like a venous insufficiency area and she certainly has evidence of this on both sides 2/26; a difficult area on the left medial ankle. The patient clearly has chronic venous hypertension with some degree of lymphedema. The odd thing about the area is the small petechial hemorrhages. I am not really sure how to explain this. This was present last time and this is not a compression injury. We have been using Hydrofera Blue which I changed to last week 3/4; still using Hydrofera Blue. Aggressive debridement today. She does not have known arterial issues. She has seen Dr. Kellie Simmering at [facility] vein and vascular and and has an ablation on the left. [Anterior accessory branch of the greater saphenous]. From what I remember they did not feel she had an arterial issue. The patient has had this area biopsied in 2009 at [facility] dermatology and by her recollection they said this was "stasis". She is also follow-up with dermatology locally who thought that this was more of a vascular issue  3/11; using Hydrofera Blue. Aggressive debridement today. She does not have an arterial issue. We are using 3 layer compression although we may need to go to 4. The patient has been in for multiple changes to her wrap since I last saw her a week  ago. She says that the area was leaking. I do not have too much more information on what was found 01/19/19 on evaluation today patient was actually being seen for a nurse visit when unfortunately she had the area on her left lateral lower extremity as well as weeping from the right lower extremity that became apparent. Therefore we did end up actually seeing her for a full visit with myself. She is having some pain at this site as well but fortunately nothing too significant at this point. No fevers, chills, nausea, or vomiting noted at this time. 3/18-Patient is back to the clinic with the left leg venous leg ulcer, the ulcer is larger in size, has a surface that is densely adherent with fibrinous tissue, the Hydrofera Blue was used but is densely adherent and there was difficulty in removing it. The right lower extremity was also wrapped for weeping edema. Patient has a new area over the left lateral foot above the malleolus that is small and appears to have no debris with intact surrounding skin. Patient is on increased dose of Lasix also as a means to edema management 3/25; the patient has a nonhealing venous ulcer on the medial left leg and last week developed a smaller area on the lateral left calf. We have been using Hydrofera Blue with a contact layer. 4/1; no major change in these wounds areas. Left medial and more recently left lateral calf. I tried Iodoflex last week to aid in debridement she did not tolerate this. She stated her pain was terrible all week. She took the top layer of the 4 layer compression off. 4/8; the patient actually looks somewhat better in terms of her more prominent left lateral calf wound. There is some healthy looking tissue here. She is still complaining of a lot of discomfort. 4/15; patient in a lot of pain secondary to sciatica. She is on a prednisone taper prescribed by her primary physician. She has the 2 areas one on the left medial and more recently a  smaller area on the left lateral calf. Both of these just above the malleoli Objective Constitutional Patient is hypertensive.. Pulse regular and within target range for patient.Marland Kitchen Respirations regular, non-labored and within target range.. Temperature is normal and within the target range for the patient.Marland Kitchen appears in no distress. Vitals Time Taken: 10:10 AM, Height: 63 in, Weight: 224.7 lbs, BMI: 39.8, Temperature: 98.5 F, Pulse: 79 bpm, Respiratory Rate: 18 breaths/min, Blood Pressure: 145/71 mmHg. [name], [name] ([id number]) Eyes Conjunctivae clear. No discharge. Respiratory Respiratory effort is easy and symmetric bilaterally. Rate is normal at rest and on room air.. Cardiovascular Significant evidence of chronic venous hypertension with dilated small veins in her medial and and lateral foot. Lymphatic None palpable in the left popliteal or inguinal area. Psychiatric No evidence of depression, anxiety, or agitation. Calm, cooperative, and communicative. Appropriate interactions and affect.. General Notes: Wound exam; left medial calf. There is really no improvement here this is covered with tightly adherent fibrinous debris. The area laterally is in a very similar condition although the wound area smaller. There is no evidence of surrounding infection. Her peripheral pulses are palpable. Edema control is good Integumentary (Hair, Skin) No evidence of surrounding infection. Wound #  5 status is Open. Original cause of wound was Gradually Appeared. The wound is located on the Left,Medial Lower Leg. The wound measures 4cm length x 1.5cm width x 0.2cm depth; 4.712cm^2 area and 0.942cm^3 volume. There is Fat Layer (Subcutaneous Tissue) Exposed exposed. There is no tunneling or undermining noted. There is a medium amount of serosanguineous drainage noted. The wound margin is indistinct and nonvisible. There is medium (34-66%) red, pink granulation within the wound bed. There is a medium  (34-66%) amount of necrotic tissue within the wound bed including Adherent Slough. The periwound skin appearance exhibited: Maceration, Hemosiderin Staining. The periwound skin appearance did not exhibit: Callus, Crepitus, Excoriation, Induration, Rash, Scarring, Dry/Scaly, Atrophie Blanche, Cyanosis, Ecchymosis, Mottled, Pallor, Rubor, Erythema. Periwound temperature was noted as No Abnormality. The periwound has tenderness on palpation. Wound #6 status is Open. Original cause of wound was Gradually Appeared. The wound is located on the Left,Lateral Lower Leg. The wound measures 1.5cm length x 2cm width x 0.1cm depth; 2.356cm^2 area and 0.236cm^3 volume. There is Fat Layer (Subcutaneous Tissue) Exposed exposed. There is no tunneling or undermining noted. There is a medium amount of serous drainage noted. The wound margin is flat and intact. There is medium (34-66%) red granulation within the wound bed. There is a medium (34-66%) amount of necrotic tissue within the wound bed including Adherent Slough. The periwound skin appearance exhibited: Hemosiderin Staining. The periwound skin appearance did not exhibit: Callus, Crepitus, Excoriation, Induration, Rash, Scarring, Dry/Scaly, Maceration, Atrophie Blanche, Cyanosis, Ecchymosis, Mottled, Pallor, Rubor, Erythema. Periwound temperature was noted as No Abnormality. The periwound has tenderness on palpation. Assessment Active Problems ICD-10 Non-pressure chronic ulcer of left calf limited to breakdown of skin Chronic venous hypertension (idiopathic) with inflammation of right lower extremity Lymphedema, not elsewhere classified Diagnoses ICD-10 L97.221: Non-pressure chronic ulcer of left calf limited to breakdown of skin I87.321: Chronic venous hypertension (idiopathic) with inflammation of right lower extremity [name], [name]. ([id number]) I89.0: Lymphedema, not elsewhere classified Plan Wound Cleansing: Wound #5 Left,Medial Lower  Leg: Clean wound with Normal Saline. May Shower, gently pat wound dry prior to applying new dressing. Wound #6 Left,Lateral Lower Leg: Clean wound with Normal Saline. May Shower, gently pat wound dry prior to applying new dressing. Anesthetic (add to Medication List): Wound #5 Left,Medial Lower Leg: Topical Lidocaine 4% cream applied to wound bed prior to debridement (In Clinic Only). Wound #6 Left,Lateral Lower Leg: Topical Lidocaine 4% cream applied to wound bed prior to debridement (In Clinic Only). Skin Barriers/Peri-Wound Care: Wound #5 Left,Medial Lower Leg: Triamcinolone Acetonide Ointment (TCA) - on wound and peri-wound Wound #6 Left,Lateral Lower Leg: Triamcinolone Acetonide Ointment (TCA) - on wound and peri-wound Primary Wound Dressing: Wound #5 Left,Medial Lower Leg: Other: - Sorbact Wound #6 Left,Lateral Lower Leg: Other: - Sorbact Secondary Dressing: Wound #5 Left,Medial Lower Leg: ABD pad Wound #6 Left,Lateral Lower Leg: ABD pad Dressing Change Frequency: Wound #5 Left,Medial Lower Leg: Change dressing every week Wound #6 Left,Lateral Lower Leg: Change dressing every week Follow-up Appointments: Wound #5 Left,Medial Lower Leg: Return Appointment in 1 week. Nurse Visit as needed Wound #6 Left,Lateral Lower Leg: Return Appointment in 1 week. Nurse Visit as needed Edema Control: Wound #5 Left,Medial Lower Leg: Unna Boot to Left Lower Extremity - ABD up shin for comfort Wound #6 Left,Lateral Lower Leg: Unna Boot to Left Lower Extremity - ABD up shin for comfort Off-Loading: Wound #5 Left,Medial Lower Leg: Other: - Elevate legs as needed Wound #6 Left,Lateral Lower Leg: Other: - Elevate  legs as needed Medications-please add to medication list.: Wound #5 Left,Medial Lower Leg: [name], [name] ([id number]) Other: - Tramadol Wound #6 Left,Lateral Lower Leg: Other: - Tramadol 1. I am going to continue with Sorbact to attempt to debride both wound areas 2.  I told her I felt that this would require debridement mechanically. However today she barely tolerated me washing this off with saline and gauze 3. I will reattempt the debridement thought next week after her back settles down 4. She seems to be tolerating an Haematologist well and has not had to come into the clinic for re-wraps Electronic Signature(s) Signed: 02/18/2019 2:01:47 PM By: Linton Ham MD Entered By: Linton Ham on 02/18/2019 10:51:01 [name], [name] ([id number]) -------------------------------------------------------------------------------- SuperBill Details Patient Name: [name], [name]. Date of Service: 02/18/2019 Medical Record Number: [id number] Patient Account Number: [id number] Date of Birth/Sex: [birth year] (73 y.o. F) Treating RN: Cornell Barman Primary Care Provider: [name] Other Clinician: Referring Provider: [name] Treating Provider/Extender: Tito Dine in Treatment: 10 Diagnosis Coding ICD-10 Codes Code Description [phone] Non-pressure chronic ulcer of left calf limited to breakdown of skin I87.321 Chronic venous hypertension (idiopathic) with inflammation of right lower extremity I89.0 Lymphedema, not elsewhere classified Facility Procedures CPT4 Code: [id number] Description: (Facility Use Only) [phone] - [facility] Modifier: Quantity: 1 Physician Procedures CPT4 Code Description: [id number] - WC PHYS LEVEL 3 - EST PT ICD-10 Diagnosis Description L97.221 Non-pressure chronic ulcer of left calf limited to breakdown I87.321 Chronic venous hypertension (idiopathic) with inflammation of I89.0 Lymphedema, not  elsewhere classified Modifier: of skin right lower extr Quantity: 1 emity Electronic Signature(s) Signed: 02/18/2019 2:01:47 PM By: Linton Ham MD Entered By: Linton Ham on 02/18/2019 10:53:51

## 2019-02-18 NOTE — Progress Notes (Signed)
ASHA, GRUMBINE ([id number]) Visit Report for 02/18/2019 Arrival Information Details Patient Name: [name], [name]. Date of Service: 02/18/2019 9:45 AM Medical Record Number: [id number] Patient Account Number: [id number] Date of Birth/Sex: [birth year] (73 y.o. F) Treating RN: Harold Barban Primary Care Siennah Barrasso: [name] Other Clinician: Referring Khalila Buechner: [name] Treating Jasan Doughtie/Extender: Tito Dine in Treatment: 10 Visit Information History Since Last Visit Added or deleted any medications: No Patient Arrived: Wheel Chair Any new allergies or adverse reactions: No Arrival Time: 10:07 Had a fall or experienced change in No Accompanied By: self activities of daily living that may affect Transfer Assistance: None risk of falls: Patient Identification Verified: Yes Signs or symptoms of abuse/neglect since last visito No Secondary Verification Process Completed: Yes Hospitalized since last visit: No Has Dressing in Place as Prescribed: Yes Has Compression in Place as Prescribed: Yes Pain Present Now: Yes Electronic Signature(s) Signed: 02/18/2019 1:06:36 PM By: Harold Barban Entered By: Harold Barban on 02/18/2019 10:10:56 [name] ([id number]) -------------------------------------------------------------------------------- Encounter Discharge Information Details Patient Name: [name]. Date of Service: 02/18/2019 9:45 AM Medical Record Number: [id number] Patient Account Number: [id number] Date of Birth/Sex: 1946-08-30 (73 y.o. F) Treating RN: Montey Hora Primary Care Jolyne Laye: [name] Other Clinician: Referring Anvith Mauriello: [name] Treating Ovide Dusek/Extender: Tito Dine in Treatment: 10 Encounter Discharge Information Items Discharge Condition: Stable Ambulatory Status: Wheelchair Discharge Destination: Home Transportation: Private Auto Accompanied By: self Schedule Follow-up Appointment:  Yes Clinical Summary of Care: Electronic Signature(s) Signed: 02/18/2019 1:17:32 PM By: Montey Hora Entered By: Montey Hora on 02/18/2019 11:00:19 [name] ([id number]) -------------------------------------------------------------------------------- Lower Extremity Assessment Details Patient Name: [name], [name]. Date of Service: 02/18/2019 9:45 AM Medical Record Number: [id number] Patient Account Number: [id number] Date of Birth/Sex: 1946/09/08 (73 y.o. F) Treating RN: Harold Barban Primary Care Asta Corbridge: [name] Other Clinician: Referring Orin Eberwein: [name] Treating Shellie Rogoff/Extender: Tito Dine in Treatment: 10 Edema Assessment Assessed: [Left: No] [Right: No] [Left: Edema] [Right: :] Calf Left: Right: Point of Measurement: 31 cm From Medial Instep 41 cm cm Ankle Left: Right: Point of Measurement: 13 cm From Medial Instep 23.5 cm cm Vascular Assessment Pulses: Dorsalis Pedis Palpable: [Left:Yes] [Right:Yes] Posterior Tibial Palpable: [Left:Yes] [Right:Yes] Electronic Signature(s) Signed: 02/18/2019 1:06:36 PM By: Harold Barban Entered By: Harold Barban on 02/18/2019 10:26:14 [name] ([id number]) -------------------------------------------------------------------------------- Multi Wound Chart Details Patient Name: [name], [name]. Date of Service: 02/18/2019 9:45 AM Medical Record Number: [id number] Patient Account Number: [id number] Date of Birth/Sex: 23-Oct-1946 (73 y.o. F) Treating RN: Cornell Barman Primary Care Mckinzey Entwistle: [name] Other Clinician: Referring Kennice Finnie: [name] Treating Kevaughn Ewing/Extender: Tito Dine in Treatment: 10 Vital Signs Height(in): 63 Pulse(bpm): 6 Weight(lbs): 224.7 Blood Pressure(mmHg): 145/71 Body Mass Index(BMI): 40 Temperature(F): 98.5 Respiratory Rate 18 (breaths/min): Photos: [N/A:N/A] Wound Location: Left Lower Leg - Medial Left Lower Leg -  Lateral N/A Wounding Event: Gradually Appeared Gradually Appeared N/A Primary Etiology: Lymphedema Venous Leg Ulcer N/A Comorbid History: Cataracts, Asthma, Sleep Cataracts, Asthma, Sleep N/A Apnea, Deep Vein Apnea, Deep Vein Thrombosis, Hypertension, Thrombosis, Hypertension, Peripheral Venous Disease, Peripheral Venous Disease, Osteoarthritis, Received Osteoarthritis, Received Chemotherapy, Received Chemotherapy, Received Radiation Radiation Date Acquired: 11/19/2018 01/19/2019 N/A Weeks of Treatment: 10 4 N/A Wound Status: Open Open N/A Measurements L x W x D 4x1.5x0.2 1.5x2x0.1 N/A (cm) Area (cm) : 4.712 2.356 N/A Volume (cm) : 0.942 0.236 N/A % Reduction in Area: 44.90% -970.90% N/A % Reduction in Volume: -10.20% -972.70% N/A Classification: Full Thickness Without Full  Thickness Without N/A Exposed Support Structures Exposed Support Structures Exudate Amount: Medium Medium N/A Exudate Type: Serosanguineous Serous N/A Exudate Color: red, brown amber N/A Wound Margin: Indistinct, nonvisible Flat and Intact N/A Granulation Amount: Medium (34-66%) Medium (34-66%) N/A Granulation Quality: Red, Pink Red N/A Necrotic Amount: Medium (34-66%) Medium (34-66%) N/A Exposed Structures: N/A CLEMA, SKOUSEN ([id number]) Fat Layer (Subcutaneous Fat Layer (Subcutaneous Tissue) Exposed: Yes Tissue) Exposed: Yes Fascia: No Fascia: No Tendon: No Tendon: No Muscle: No Muscle: No Joint: No Joint: No Bone: No Bone: No Epithelialization: Small (1-33%) None N/A Periwound Skin Texture: Excoriation: No Excoriation: No N/A Induration: No Induration: No Callus: No Callus: No Crepitus: No Crepitus: No Rash: No Rash: No Scarring: No Scarring: No Periwound Skin Moisture: Maceration: Yes Maceration: No N/A Dry/Scaly: No Dry/Scaly: No Periwound Skin Color: Hemosiderin Staining: Yes Hemosiderin Staining: Yes N/A Atrophie Blanche: No Atrophie Blanche: No Cyanosis: No Cyanosis:  No Ecchymosis: No Ecchymosis: No Erythema: No Erythema: No Mottled: No Mottled: No Pallor: No Pallor: No Rubor: No Rubor: No Temperature: No Abnormality No Abnormality N/A Tenderness on Palpation: Yes Yes N/A Treatment Notes Electronic Signature(s) Signed: 02/18/2019 2:01:47 PM By: Linton Ham MD Entered By: Linton Ham on 02/18/2019 10:45:58 [place], [name] ([id number]) -------------------------------------------------------------------------------- Multi-Disciplinary Care Plan Details Patient Name: CRISLYN, WILLBANKS. Date of Service: 02/18/2019 9:45 AM Medical Record Number: [id number] Patient Account Number: [id number] Date of Birth/Sex: [birth year] (73 y.o. F) Treating RN: Cornell Barman Primary Care Alen Matheson: [name] Other Clinician: Referring Shatina Streets: [name] Treating Shrihan Putt/Extender: Tito Dine in Treatment: 10 Active Inactive Orientation to the Wound Care Program Nursing Diagnoses: Knowledge deficit related to the wound healing center program Goals: Patient/caregiver will verbalize understanding of the [place] [facility] Date Initiated: 12/10/2018 Target Resolution Date: 01/09/2019 Goal Status: Active Interventions: Provide education on orientation to the wound center Notes: Soft Tissue Infection Nursing Diagnoses: Impaired tissue integrity Goals: Patient's soft tissue infection will resolve Date Initiated: 12/10/2018 Target Resolution Date: 01/09/2019 Goal Status: Active Interventions: Assess signs and symptoms of infection every visit Notes: Venous Leg Ulcer Nursing Diagnoses: Actual venous Insuffiency (use after diagnosis is confirmed) Goals: Patient will maintain optimal edema control Date Initiated: 12/10/2018 Target Resolution Date: 01/09/2019 Goal Status: Active Interventions: Assess peripheral edema status every visit. SHERICKA, JOHNSTONE ([id number]) Treatment Activities: Therapeutic compression applied :  12/10/2018 Notes: Wound/Skin Impairment Nursing Diagnoses: Impaired tissue integrity Goals: Patient/caregiver will verbalize understanding of skin care regimen Date Initiated: 12/10/2018 Target Resolution Date: 01/09/2019 Goal Status: Active Interventions: Assess ulceration(s) every visit Treatment Activities: Topical wound management initiated : 12/10/2018 Notes: Electronic Signature(s) Signed: 02/18/2019 3:17:52 PM By: [name], [name], RN, CWS, [name], [name] Entered By: [name], [name], RN, CWS, Kim on 02/18/2019 10:35:12 TIFFINI, BLACKSHER ([id number]) -------------------------------------------------------------------------------- Pain Assessment Details Patient Name: UNDREA, ARCHBOLD. Date of Service: 02/18/2019 9:45 AM Medical Record Number: [id number] Patient Account Number: [id number] Date of Birth/Sex: 11/11/1945 (73 y.o. F) Treating RN: Harold Barban Primary Care Kevork Joyce: [name] Other Clinician: Referring Raveen Wieseler: [name] Treating Kawon Willcutt/Extender: Tito Dine in Treatment: 10 Active Problems Location of Pain Severity and Description of Pain Patient Has Paino No Site Locations Pain Management and Medication Current Pain Management: Electronic Signature(s) Signed: 02/18/2019 1:06:36 PM By: Harold Barban Entered By: Harold Barban on 02/18/2019 10:11:47 [name] ([id number]) -------------------------------------------------------------------------------- Patient/Caregiver Education Details Patient Name: ELYCE, ZOLLINGER. Date of Service: 02/18/2019 9:45 AM Medical Record Number: [id number] Patient Account Number: [id number] Date of Birth/Gender: [birth year] (73 y.o. F) Treating RN:  Cornell Barman Primary Care Physician: [name] Other Clinician: Referring Physician: [name] Treating Physician/Extender: Tito Dine in Treatment: 10 Education Assessment Education Provided To: Patient Education Topics  Provided Wound/Skin Impairment: Handouts: Caring for Your Ulcer Methods: Demonstration, Explain/Verbal Responses: State content correctly Electronic Signature(s) Signed: 02/18/2019 3:17:52 PM By: [name], [name], RN, CWS, [name], [name] Entered By: [name], [name], RN, CWS, Kim on 02/18/2019 10:38:54 [name] ([id number]) -------------------------------------------------------------------------------- Wound Assessment Details Patient Name: VANSHIKA, JASTRZEBSKI. Date of Service: 02/18/2019 9:45 AM Medical Record Number: [id number] Patient Account Number: [id number] Date of Birth/Sex: 1946-03-16 (73 y.o. F) Treating RN: Harold Barban Primary Care Nikolaj Geraghty: [name] Other Clinician: Referring Roxanne Panek: [name] Treating Norabelle Kondo/Extender: Tito Dine in Treatment: 10 Wound Status Wound Number: 5 Primary Lymphedema Etiology: Wound Location: Left Lower Leg - Medial Wound Open Wounding Event: Gradually Appeared Status: Date Acquired: 11/19/2018 Comorbid Cataracts, Asthma, Sleep Apnea, Deep Vein Weeks Of Treatment: 10 History: Thrombosis, Hypertension, Peripheral Venous Clustered Wound: No Disease, Osteoarthritis, Received Chemotherapy, Received Radiation Photos Wound Measurements Length: (cm) 4 Width: (cm) 1.5 Depth: (cm) 0.2 Area: (cm) 4.712 Volume: (cm) 0.942 % Reduction in Area: 44.9% % Reduction in Volume: -10.2% Epithelialization: Small (1-33%) Tunneling: No Undermining: No Wound Description Full Thickness Without Exposed Support Classification: Structures Wound Margin: Indistinct, nonvisible Exudate Medium Amount: Exudate Type: Serosanguineous Exudate Color: red, brown Foul Odor After Cleansing: No Slough/Fibrino Yes Wound Bed Granulation Amount: Medium (34-66%) Exposed Structure Granulation Quality: Red, Pink Fascia Exposed: No Necrotic Amount: Medium (34-66%) Fat Layer (Subcutaneous Tissue) Exposed: Yes Necrotic Quality: Adherent  Slough Tendon Exposed: No Muscle Exposed: No Joint Exposed: No Bone Exposed: No Rutt, Quana J. ([id number]) Periwound Skin Texture Texture Color No Abnormalities Noted: No No Abnormalities Noted: No Callus: No Atrophie Blanche: No Crepitus: No Cyanosis: No Excoriation: No Ecchymosis: No Induration: No Erythema: No Rash: No Hemosiderin Staining: Yes Scarring: No Mottled: No Pallor: No Moisture Rubor: No No Abnormalities Noted: No Dry / Scaly: No Temperature / Pain Maceration: Yes Temperature: No Abnormality Tenderness on Palpation: Yes Treatment Notes Wound #5 (Left, Medial Lower Leg) Notes TCA, sorbact, abd, unna boot Electronic Signature(s) Signed: 02/18/2019 1:06:36 PM By: Harold Barban Entered By: Harold Barban on 02/18/2019 10:23:11 [name] ([id number]) -------------------------------------------------------------------------------- Wound Assessment Details Patient Name: JONASIA, COINER. Date of Service: 02/18/2019 9:45 AM Medical Record Number: [id number] Patient Account Number: [id number] Date of Birth/Sex: 1946-08-11 (73 y.o. F) Treating RN: Harold Barban Primary Care Ylianna Almanzar: [name] Other Clinician: Referring Audrielle Vankuren: [name] Treating Qais [name]/Extender: Tito Dine in Treatment: 10 Wound Status Wound Number: 6 Primary Venous Leg Ulcer Etiology: Wound Location: Left Lower Leg - Lateral Wound Open Wounding Event: Gradually Appeared Status: Date Acquired: 01/19/2019 Comorbid Cataracts, Asthma, Sleep Apnea, Deep Vein Weeks Of Treatment: 4 History: Thrombosis, Hypertension, Peripheral Venous Clustered Wound: No Disease, Osteoarthritis, Received Chemotherapy, Received Radiation Photos Wound Measurements Length: (cm) 1.5 Width: (cm) 2 Depth: (cm) 0.1 Area: (cm) 2.356 Volume: (cm) 0.236 % Reduction in Area: -970.9% % Reduction in Volume: -972.7% Epithelialization: None Tunneling: No Undermining:  No Wound Description Full Thickness Without Exposed Support Classification: Structures Wound Margin: Flat and Intact Exudate Medium Amount: Exudate Type: Serous Exudate Color: amber Foul Odor After Cleansing: No Slough/Fibrino Yes Wound Bed Granulation Amount: Medium (34-66%) Exposed Structure Granulation Quality: Red Fascia Exposed: No Necrotic Amount: Medium (34-66%) Fat Layer (Subcutaneous Tissue) Exposed: Yes Necrotic Quality: Adherent Slough Tendon Exposed: No Muscle Exposed: No Joint Exposed: No Bone Exposed: No Muldoon, Oleta  J. ([id number]) Periwound Skin Texture Texture Color No Abnormalities Noted: No No Abnormalities Noted: No Callus: No Atrophie Blanche: No Crepitus: No Cyanosis: No Excoriation: No Ecchymosis: No Induration: No Erythema: No Rash: No Hemosiderin Staining: Yes Scarring: No Mottled: No Pallor: No Moisture Rubor: No No Abnormalities Noted: No Dry / Scaly: No Temperature / Pain Maceration: No Temperature: No Abnormality Tenderness on Palpation: Yes Treatment Notes Wound #6 (Left, Lateral Lower Leg) Notes TCA, sorbact, abd, unna boot Electronic Signature(s) Signed: 02/18/2019 1:06:36 PM By: Harold Barban Entered By: Harold Barban on 02/18/2019 10:23:43 Mistry, [name] ([id number]) -------------------------------------------------------------------------------- Vitals Details Patient Name: DREMA, EDDINGTON. Date of Service: 02/18/2019 9:45 AM Medical Record Number: [id number] Patient Account Number: [id number] Date of Birth/Sex: Apr 28, 1946 (73 y.o. F) Treating RN: Harold Barban Primary Care Leonce Bale: [name] Other Clinician: Referring Tramya Schoenfelder: [name] Treating Watson Robarge/Extender: Tito Dine in Treatment: 10 Vital Signs Time Taken: 10:10 Temperature (F): 98.5 Height (in): 63 Pulse (bpm): 79 Weight (lbs): 224.7 Respiratory Rate (breaths/min): 18 Body Mass Index (BMI): 39.8 Blood Pressure  (mmHg): 145/71 Reference Range: 80 - 120 mg / dl Electronic Signature(s) Signed: 02/18/2019 1:06:36 PM By: Harold Barban Entered By: Harold Barban on 02/18/2019 10:12:03

## 2019-02-25 ENCOUNTER — Encounter: Payer: Medicare Other | Admitting: Internal Medicine

## 2019-02-25 ENCOUNTER — Other Ambulatory Visit: Payer: Self-pay

## 2019-02-25 DIAGNOSIS — I87312 Chronic venous hypertension (idiopathic) with ulcer of left lower extremity: Secondary | ICD-10-CM | POA: Diagnosis not present

## 2019-02-25 DIAGNOSIS — J45909 Unspecified asthma, uncomplicated: Secondary | ICD-10-CM | POA: Diagnosis not present

## 2019-02-25 DIAGNOSIS — L97222 Non-pressure chronic ulcer of left calf with fat layer exposed: Secondary | ICD-10-CM | POA: Diagnosis not present

## 2019-02-25 DIAGNOSIS — I87302 Chronic venous hypertension (idiopathic) without complications of left lower extremity: Secondary | ICD-10-CM | POA: Diagnosis not present

## 2019-02-25 DIAGNOSIS — G4733 Obstructive sleep apnea (adult) (pediatric): Secondary | ICD-10-CM | POA: Diagnosis not present

## 2019-02-25 DIAGNOSIS — I890 Lymphedema, not elsewhere classified: Secondary | ICD-10-CM | POA: Diagnosis not present

## 2019-02-25 DIAGNOSIS — I10 Essential (primary) hypertension: Secondary | ICD-10-CM | POA: Diagnosis not present

## 2019-02-25 DIAGNOSIS — L97221 Non-pressure chronic ulcer of left calf limited to breakdown of skin: Secondary | ICD-10-CM | POA: Diagnosis not present

## 2019-02-25 NOTE — Progress Notes (Signed)
BRANIYA, FARRUGIA ([id number]) Visit Report for 02/25/2019 HPI Details Patient Name: [name], [name]. Date of Service: 02/25/2019 8:30 AM Medical Record Number: [id number] Patient Account Number: [id number] Date of Birth/Sex: [birth year] (73 y.o. F) Treating RN: Cornell Barman Primary Care Provider: [name] Other Clinician: Referring Provider: [name] Treating Provider/Extender: Tito Dine in Treatment: 11 History of Present Illness HPI Description: Pleasant 73 year old with history of chronic venous insufficiency. No diabetes or peripheral vascular disease. Left ABI 1.29. Questionable history of left lower extremity DVT. She developed a recurrent ulceration on her left lateral calf in December 2015, which she attributes to poor diet and subsequent lower extremity edema. She underwent endovenous laser ablation of her left greater saphenous vein in 2010. She underwent laser ablation of accessory branch of left GSV in April 2016 by Dr. Kellie Simmering at [facility]. She was previously wearing Unna boots, which she tolerated well. Tolerating 2 layer compression and cadexomer iodine. She returns to clinic for follow-up and is without new complaints. She denies any significant pain at this time. She reports persistent pain with pressure. No claudication or ischemic rest pain. No fever or chills. No drainage. READMISSION 11/13/16; this is a 73 year old woman who is not a diabetic. She is here for a review of a painful area on her left medial lower extremity. I note that she was seen here previously last year for wound I believe to be in the same area. At that time she had undergone previously a left greater saphenous vein ablation by Dr. Kellie Simmering and she had a ablation of the anterior accessory branch of the left greater saphenous vein in March 2016. Seeing that the wound actually closed over. In reviewing the history with her today the ulcer in this area has been recurrent. She  describes a biopsy of this area in 2009 that only showed stasis physiology. 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 Do not see that she has ever had arterial studies in the left leg. She tells me she has a follow-up with Dr. Kellie Simmering in roughly 10 days In any case she developed the reopening of this area roughly a month ago. On the background of this she describes rapidly increasing edema which has responded to Lasix 40 mg and metolazone 2.5 mg as well as the patient's lymph massage. She has been told she has both venous insufficiency and lymphedema but she cannot tolerate compression stockings 11/28/16; the patient saw Dr. Kellie Simmering recently. Per the patient he did arterial Dopplers in the office that did not show evidence of arterial insufficiency, per the patient he stated "treat this like an ordinary venous ulcer". She also saw her dermatologist Dr. Ronnald Ramp who felt that this was more of a vascular ulcer. In general things are improving although she arrives today with increasing bilateral lower extremity edema with weeping a deeper fluid through the wound on the left medial leg compatible with some degree of lymphedema 12/04/16; the patient's wound is fully epithelialized but I don't think fully healed. We will do another week of depression with Promogran and TCA however I suspect we'll be able to discharge her next week. This is a very unusual-looking wound which was initially a figure-of-eight type wound lying on its side surrounded by petechial like hemorrhage. She  has had venous ablation on this side. She apparently does not have an arterial issue per Dr. Kellie Simmering. She saw her dermatologist thought it was "vascular". Patient is definitely going to need ongoing compression and I  talked about this with her today she will go to elastic therapy after she leaves here next week 12/11/16; the patient's wound is not completely closed today. She has surrounding scar tissue and in further discussion with the patient it would appear that she had ulcers in this area in 2009 for a prolonged period of time ultimately requiring a punch biopsy of this area that only showed venous insufficiency. I did not previously pickup on this part of the history from the patient. 12/18/16; the patient's wound is completely epithelialized. There is no open area here. She has significant bilateral venous insufficiency with secondary lymphedema to a mild-to-moderate degree she does not have compression stockings.. She did not say anything to me when I was in the room, she told our intake nurse that she was still having pain in this area. This isn't [name], [name] ([id number]) unusual recurrent small open area. She is going to go to elastic therapy to obtain compression stockings. 12/25/16; the patient's wound is fully epithelialized. There is no open area here. The patient describes some continued episodic discomfort in this area medial left calf. However everything looks fine and healed here. She is been to elastic therapy and caught herself 15-20 mmHg stockings, they apparently were having trouble getting 20-30 mm stockings in her size 01/22/17; this is a patient we discharged from the clinic a month ago. She has a recurrent open wound on her medial left calf. She had 15 mm support stockings. I told her I thought she needed 20-30 mm compression stockings. She tells me that she has been ill with hospitalization secondary to asthma and is been found to have severe hypokalemia likely secondary to a combination of Lasix and metolazone. This morning she noted blistering and leaking fluid on the posterior part of her left leg. She called our intake nurse urgently and we was saw her this afternoon. She has not  had any real discomfort here. I don't know that she's been wearing any stockings on this leg for at least 2-3 days. ABIs in this clinic were 1.21 on the right and 1.3 on the left. She is previously seen vascular surgery who does not think that there is a peripheral arterial issue. 01/30/17; Patient arrives with no open wound on the left leg. She has been to elastic therapy and obtained 20-42mmhg below knee stockings and she has one on the right leg today. READMISSION 02/19/18; this Moffa is a now 73 year old patient we've had in this clinic perhaps 3 times before. I had last looked at her from January 07 December 2016 with an area on the medial left leg. We discharged her on 12/25/16 however she had to be readmitted on 01/22/17 with a recurrence. I have in my notes that we discharged her on 20-30 mm stockings although she tells me she was only wearing support hose because she cannot get stockings on predominantly related to her cervical spine surgery/issues. She has had previous ablations done by vein and vascular in [place] including a great saphenous vein ablation on the left with an anterior accessory branch ablation I think both of these were in 2016. On one of the previous visit she had a biopsy noted 2009 that was negative. She is not felt to have an arterial issue. She is not  a diabetic. She does have a history of obstructive sleep apnea hypertension asthma as well as chronic venous insufficiency and lymphedema. On this occasion she noted 2 dry scaly patch on her left leg. She tried to put lotion on this it didn't really help. There were 2 open areas.the patient has been seeing her primary physician from 02/05/18 through 02/14/18. She had Unna boots applied. The superior wound now on the lateral left leg has closed but she's had one wound that remains open on the lateral left leg. This is not the same spot as we dealt with in 2018. ABIs in this clinic were 1.3 bilaterally 02/26/18; patient has a  small wound on the left lateral calf. Dimensions are down. She has chronic venous insufficiency and lymphedema. 03/05/18; small open area on the left lateral calf. Dimensions are down. Tightly adherent necrotic debris over the surface of the wound which was difficult to remove. Also the dressing [over collagen] stuck to the wound surface. This was removed with some difficulty as well. Change the primary dressing to Hydrofera Blue ready 03/12/18; small open area on the left lateral calf. Comes in with tightly adherent surface eschar as well as some adherent Hydrofera Blue. 03/19/18; open area on the left lateral calf. Again adherent surface eschar as well as some adherent Hydrofera Blue nonviable subcutaneous tissue. She complained of pain all week even with the reduction from 4-3 layer compression I put on last week. Also she had an increase in her ankle and calf measurements probably related to the same thing. 03/26/18; open area on the left lateral calf. A very small open area remains here. We used silver alginate starting last week as the Hydrofera Blue seem to stick to the wound bed. In using 4-layer compression 04/02/18; the open area in the left lateral calf at some adherent slough which I removed there is no open area here. We are able to transition her into her own compression stocking. Truthfully I think this is probably his support hose. However this does not maintain skin integrity will be limited. She cannot put over the toe compression stockings on because of neck problems hand problems etc. She is allergic to the lining layer of juxta lites. We might be forced to use extremitease stocking should this fail READMIT 11/24/2018 Patient is now a 73 year old woman who is not a diabetic. She has been in this clinic on at least 3 previous occasions largely with recurrent wounds on her left leg secondary to chronic venous insufficiency with secondary lymphedema. Her situation is complicated by  inability to get stockings on and an allergy to neoprene which is apparently a component and at least juxta lites and other stockings. As a result she really has not been wearing any stockings on her legs. She tells [place] that roughly 2 or 3 weeks ago she started noticing a stinging sensation just above her ankle on the left medial aspect. She has been diagnosed with pseudogout and she wondered whether this was what she was experiencing. She tried to dress this with something she bought at the store however subsequently it pulled skin off and now she has an open wound that is not improving. She has been using Vaseline gauze with a cover bandage. She saw her primary doctor last week who RASHAWN, ROLON ([id number]) put an Unna boot on her. ABIs in this clinic was 1.03 on the left 2/12; the area is on the left medial ankle. Odd-looking wound with what looks to be surface epithelialization  but a multitude of small petechial openings. This clearly not closed yet. We have been using silver alginate under 3 layer compression with TCA 2/19; the wound area did not look quite as good this week. Necrotic debris over the majority of the wound surface which required debridement. She continues to have a multitude of what looked to be small petechial openings. She reminds [place] that she had a biopsy on this initially during her first outbreak in 2015 in [place] dermatology. She expresses concern about this being a possible melanoma. 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 I assured her that this does not look anything like melanoma. Except for the petechial reaction it does look like a venous insufficiency area and she certainly has evidence of this on both sides 2/26; a difficult area on the left medial ankle. The patient clearly has chronic venous hypertension with some degree of lymphedema. The odd thing about the area is  the small petechial hemorrhages. I am not really sure how to explain this. This was present last time and this is not a compression injury. We have been using Hydrofera Blue which I changed to last week 3/4; still using Hydrofera Blue. Aggressive debridement today. She does not have known arterial issues. She has seen Dr. Kellie Simmering at [facility] vein and vascular and and has an ablation on the left. [Anterior accessory branch of the greater saphenous]. From what I remember they did not feel she had an arterial issue. The patient has had this area biopsied in 2009 at [facility] dermatology and by her recollection they said this was "stasis". She is also follow-up with dermatology locally who thought that this was more of a vascular issue 3/11; using Hydrofera Blue. Aggressive debridement today. She does not have an arterial issue. We are using 3 layer compression although we may need to go to 4. The patient has been in for multiple changes to her wrap since I last saw her a week ago. She says that the area was leaking. I do not have too much more information on what was found 01/19/19 on evaluation today patient was actually being seen for a nurse visit when unfortunately she had the area on her left lateral lower extremity as well as weeping from the right lower extremity that became apparent. Therefore we did end up actually seeing her for a full visit with myself. She is having some pain at this site as well but fortunately nothing too significant at this point. No fevers, chills, nausea, or vomiting noted at this time. 3/18-Patient is back to the clinic with the left leg venous leg ulcer, the ulcer is larger in size, has a surface that is densely adherent with fibrinous tissue, the Hydrofera Blue was used but is densely adherent and there was difficulty in removing it. The right lower extremity was also wrapped for weeping edema. Patient has a new area over the left lateral foot above the malleolus  that is small and appears to have no debris with intact surrounding skin. Patient is on increased dose of Lasix also as a means to edema management 3/25; the patient has a nonhealing venous ulcer on the medial left leg and last week developed a smaller area on the lateral left calf. We have been using Hydrofera Blue with a contact layer. 4/1; no major change in these wounds areas. Left medial and more recently left lateral calf. I tried Iodoflex last week  to aid in debridement she did not tolerate this. She stated her pain was terrible all week. She took the top layer of the 4 layer compression off. 4/8; the patient actually looks somewhat better in terms of her more prominent left lateral calf wound. There is some healthy looking tissue here. She is still complaining of a lot of discomfort. 4/15; patient in a lot of pain secondary to sciatica. She is on a prednisone taper prescribed by her primary physician. She has the 2 areas one on the left medial and more recently a smaller area on the left lateral calf. Both of these just above the malleoli 4/22; her back pain is better but she still states she is very uncomfortable and now feels she is intolerant to the [employer]. No real change in the wounds we have been using Sorbact. She has been previously intolerant to Iodoflex. There is not a lot of option about what we can use to debride this wound under compression that she no doubt needs. 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 Date of Service: 02/25/2019 8:30 AM Medical Record Number: [id number] Patient Account Number: [id number] Date of Birth/Sex: Jul 04, 1946 (73 y.o. F) Treating RN: Cornell Barman Primary  Care Provider: [name] Other Clinician: Referring Provider: [name] Treating Provider/Extender: Tito Dine in Treatment: 11 Constitutional Sitting or standing Blood Pressure is within target range for patient.. Pulse regular and within target range for patient.Marland Kitchen Respirations regular, non-labored and within target range.. Temperature is normal and within the target range for the patient.Marland Kitchen appears in no distress. Eyes Conjunctivae clear. No discharge. Cardiovascular Pedal pulses palpable and strong bilaterally.. Her edema control is quite good. Lymphatic Not palpable in the popliteal area. Integumentary (Hair, Skin) Chronic venous insufficiency and lymphedema. There is no erythema. No evidence of a significant contact or allergic phenomenon from the Unna wrap. Psychiatric No evidence of depression, anxiety, or agitation. Calm, cooperative, and communicative. Appropriate interactions and affect.. Notes Wound exam; left medial calf and left lateral calf. No debridement in either area. The vast majority of both wounds is slough covered. There is no surrounding erythema no evidence of infection however we have not had much success in the nonmechanical debridement Electronic Signature(s) Signed: 02/25/2019 3:44:07 PM By: Linton Ham MD Entered By: Linton Ham on 02/25/2019 Seymour, [name] ([id number]) -------------------------------------------------------------------------------- Physician Orders Details Patient Name: [name], [name]. Date of Service: 02/25/2019 8:30 AM Medical Record Number: [id number] Patient Account Number: [id number] Date of Birth/Sex: 10/29/1946 (73 y.o. F) Treating RN: Cornell Barman Primary Care Provider: [name] Other Clinician: Referring Provider: [name] Treating Provider/Extender: Tito Dine in Treatment: 11 Verbal / Phone Orders: No Diagnosis Coding Wound Cleansing Wound #5  Left,Medial Lower Leg o Clean wound with Normal Saline. o May Shower, gently pat wound dry prior to applying new dressing. Wound #6 Left,Lateral Lower Leg o Clean wound with Normal Saline. o May Shower, gently pat wound dry prior to applying new dressing. Anesthetic (add to Medication List) Wound #5 Left,Medial Lower Leg o Topical Lidocaine 4% cream applied to wound bed prior to debridement (In Clinic Only). Wound #6 Left,Lateral Lower Leg o Topical Lidocaine 4% cream applied to wound bed prior to debridement (In Clinic Only). Skin Barriers/Peri-Wound Care Wound #5 Left,Medial Lower Leg o Triamcinolone Acetonide Ointment (  TCA) - on wound and peri-wound Wound #6 Left,Lateral Lower Leg o Triamcinolone Acetonide Ointment (TCA) - on wound and peri-wound Primary Wound Dressing Wound #5 Left,Medial Lower Leg o Other: - Sorbact Wound #6 Left,Lateral Lower Leg o Other: - Sorbact Secondary Dressing Wound #5 Left,Medial Lower Leg o ABD pad Wound #6 Left,Lateral Lower Leg o ABD pad Dressing Change Frequency Wound #5 Left,Medial Lower Leg o Change dressing every week Wound #6 Left,Lateral Lower Leg o Change dressing every week PARMINDER, TRAPANI ([id number]) Follow-up Appointments Wound #5 Left,Medial Lower Leg o Return Appointment in 1 week. o Nurse Visit as needed Wound #6 Left,Lateral Lower Leg o Return Appointment in 1 week. o Nurse Visit as needed Edema Control Wound #5 Left,Medial Lower Leg o 4-Layer Compression System - Left Lower Extremity. Wound #6 Left,Lateral Lower Leg o 4-Layer Compression System - Left Lower Extremity. Off-Loading Wound #5 Left,Medial Lower Leg o Other: - Elevate legs as needed o Other: - Elevate legs as needed Wound #6 Left,Lateral Lower Leg o Other: - Elevate legs as needed o Other: - Elevate legs as needed Medications-please add to medication list. Wound #5 Left,Medial Lower Leg o Other: -  Tramadol Wound #6 Left,Lateral Lower Leg o Other: - Tramadol Electronic Signature(s) Signed: 02/25/2019 3:44:07 PM By: Linton Ham MD Signed: 02/25/2019 4:31:15 PM By: [name], [name], RN, CWS, [name], [name] Entered By: [name], [name], RN, CWS, Kim on 02/25/2019 08:58:23 PEGGIE, HORNAK ([id number]) -------------------------------------------------------------------------------- Problem List Details Patient Name: CAASI, GIGLIA. Date of Service: 02/25/2019 8:30 AM Medical Record Number: [id number] Patient Account Number: [id number] Date of Birth/Sex: Apr 10, 1946 (73 y.o. 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 01/07/2019 No Yes skin I87.321 Chronic venous hypertension (idiopathic) with inflammation of 12/10/2018 No Yes right lower extremity I89.0 Lymphedema, not elsewhere classified 12/10/2018 No Yes Inactive Problems Resolved Problems Electronic Signature(s) Signed: 02/25/2019 3:44:07 PM By: Linton Ham MD Entered By: Linton Ham on 02/25/2019 09:14:14 Oliveira, [name] ([id number]) -------------------------------------------------------------------------------- Progress Note Details Patient Name: DONNICE, NIELSEN. Date of Service: 02/25/2019 8:30 AM Medical Record Number: [id number] Patient Account Number: [id number] Date of Birth/Sex: 1946/06/04 (73 y.o. F) Treating RN: Cornell Barman Primary Care Provider: [name] Other Clinician: Referring Provider: [name] Treating Provider/Extender: Tito Dine in Treatment: 11 Subjective History of Present Illness (HPI) Pleasant 73 year old with history of chronic venous insufficiency. No diabetes or peripheral  vascular disease. Left ABI 1.29. Questionable history of left lower extremity DVT. She developed a recurrent ulceration on her left lateral calf in December 2015, which she attributes to poor diet and subsequent lower extremity edema. She underwent endovenous laser ablation of her left greater saphenous vein in 2010. She underwent laser ablation of accessory branch of left GSV in April 2016 by Dr. Kellie Simmering at [facility]. She was previously wearing Unna boots, which she tolerated well. Tolerating 2 layer compression and cadexomer iodine. She returns to clinic for follow-up and is without new complaints. She denies any significant pain at this time. She reports persistent pain with pressure. No claudication or ischemic rest pain. No fever or chills. No drainage. READMISSION 11/13/16; this is a 73 year old woman who is not a diabetic. She is here for a review of a painful area on her left medial lower extremity. I note  that she was seen here previously last year for wound I believe to be in the same area. At that time she had undergone previously a left greater saphenous vein ablation by Dr. Kellie Simmering and she had a ablation of the anterior accessory branch of the left greater saphenous vein in March 2016. Seeing that the wound actually closed over. In reviewing the history with her today the ulcer in this area has been recurrent. She describes a biopsy of this area in 2009 that only showed stasis physiology. 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 Do not see that she has ever had arterial studies in the left leg. She tells me she has a follow-up with Dr. Kellie Simmering in roughly 10 days In any case she developed the reopening of this area roughly a month ago. On the background of this she  describes rapidly increasing edema which has responded to Lasix 40 mg and metolazone 2.5 mg as well as the patient's lymph massage. She has been told she has both venous insufficiency and lymphedema but she cannot tolerate compression stockings 11/28/16; the patient saw Dr. Kellie Simmering recently. Per the patient he did arterial Dopplers in the office that did not show evidence of arterial insufficiency, per the patient he stated "treat this like an ordinary venous ulcer". She also saw her dermatologist Dr. Ronnald Ramp who felt that this was more of a vascular ulcer. In general things are improving although she arrives today with increasing bilateral lower extremity edema with weeping a deeper fluid through the wound on the left medial leg compatible with some degree of lymphedema 12/04/16; the patient's wound is fully epithelialized but I don't think fully healed. We will do another week of depression with Promogran and TCA however I suspect we'll be able to discharge her next week. This is a very unusual-looking wound which was initially a figure-of-eight type wound lying on its side surrounded by petechial like hemorrhage. She has had venous ablation on this side. She apparently does not have an arterial issue per Dr. Kellie Simmering. She saw her dermatologist thought it was "vascular". Patient is definitely going to need ongoing compression and I talked about this with her today she will go to elastic therapy after she leaves here next week 12/11/16; the patient's wound is not completely closed today. She has surrounding scar tissue and in further discussion with the patient it would appear that she had ulcers in this area in 2009 for a prolonged period of time ultimately requiring a punch biopsy of this area that only showed venous insufficiency. I did not previously pickup on this part of the history from the patient. 12/18/16; the patient's wound is completely epithelialized. There is no open area here. She has  significant bilateral venous insufficiency with secondary lymphedema to a mild-to-moderate degree she does not have compression stockings.. She did not say anything to me when I was in the room, she told our intake nurse that she was still having pain in this area. This isn't unusual recurrent small open area. She is going to go to elastic therapy to obtain compression stockings. 12/25/16; the patient's wound is fully epithelialized. There is no open area here. The patient describes some continued episodic discomfort in this area medial left calf. However everything looks fine and  healed here. She is been to elastic therapy and GERLEAN, CID ([id number]) caught herself 15-20 mmHg stockings, they apparently were having trouble getting 20-30 mm stockings in her size 01/22/17; this is a patient we discharged from the clinic a month ago. She has a recurrent open wound on her medial left calf. She had 15 mm support stockings. I told her I thought she needed 20-30 mm compression stockings. She tells me that she has been ill with hospitalization secondary to asthma and is been found to have severe hypokalemia likely secondary to a combination of Lasix and metolazone. This morning she noted blistering and leaking fluid on the posterior part of her left leg. She called our intake nurse urgently and we was saw her this afternoon. She has not had any real discomfort here. I don't know that she's been wearing any stockings on this leg for at least 2-3 days. ABIs in this clinic were 1.21 on the right and 1.3 on the left. She is previously seen vascular surgery who does not think that there is a peripheral arterial issue. 01/30/17; Patient arrives with no open wound on the left leg. She has been to elastic therapy and obtained 20-26mmhg below knee stockings and she has one on the right leg today. READMISSION 02/19/18; this Muecke is a now 73 year old patient we've had in this clinic perhaps 3 times before. I had  last looked at her from January 07 December 2016 with an area on the medial left leg. We discharged her on 12/25/16 however she had to be readmitted on 01/22/17 with a recurrence. I have in my notes that we discharged her on 20-30 mm stockings although she tells me she was only wearing support hose because she cannot get stockings on predominantly related to her cervical spine surgery/issues. She has had previous ablations done by vein and vascular in [place] including a great saphenous vein ablation on the left with an anterior accessory branch ablation I think both of these were in 2016. On one of the previous visit she had a biopsy noted 2009 that was negative. She is not felt to have an arterial issue. She is not a diabetic. She does have a history of obstructive sleep apnea hypertension asthma as well as chronic venous insufficiency and lymphedema. On this occasion she noted 2 dry scaly patch on her left leg. She tried to put lotion on this it didn't really help. There were 2 open areas.the patient has been seeing her primary physician from 02/05/18 through 02/14/18. She had Unna boots applied. The superior wound now on the lateral left leg has closed but she's had one wound that remains open on the lateral left leg. This is not the same spot as we dealt with in 2018. ABIs in this clinic were 1.3 bilaterally 02/26/18; patient has a small wound on the left lateral calf. Dimensions are down. She has chronic venous insufficiency and lymphedema. 03/05/18; small open area on the left lateral calf. Dimensions are down. Tightly adherent necrotic debris over the surface of the wound which was difficult to remove. Also the dressing [over collagen] stuck to the wound surface. This was removed with some difficulty as well. Change the primary dressing to Hydrofera Blue ready 03/12/18; small open area on the left lateral calf. Comes in with tightly adherent surface eschar as well as some adherent Hydrofera  Blue. 03/19/18; open area on the left lateral calf. Again adherent surface eschar as well as some adherent Hydrofera Blue nonviable subcutaneous tissue. She  complained of pain all week even with the reduction from 4-3 layer compression I put on last week. Also she had an increase in her ankle and calf measurements probably related to the same thing. 03/26/18; open area on the left lateral calf. A very small open area remains here. We used silver alginate starting last week as the Hydrofera Blue seem to stick to the wound bed. In using 4-layer compression 04/02/18; the open area in the left lateral calf at some adherent slough which I removed there is no open area here. We are able to transition her into her own compression stocking. Truthfully I think this is probably his support hose. However this does not maintain skin integrity will be limited. She cannot put over the toe compression stockings on because of neck problems hand problems etc. She is allergic to the lining layer of juxta lites. We might be forced to use extremitease stocking should this fail READMIT 11/24/2018 Patient is now a 73 year old woman who is not a diabetic. She has been in this clinic on at least 3 previous occasions largely with recurrent wounds on her left leg secondary to chronic venous insufficiency with secondary lymphedema. Her situation is complicated by inability to get stockings on and an allergy to neoprene which is apparently a component and at least juxta lites and other stockings. As a result she really has not been wearing any stockings on her legs. She tells [place] that roughly 2 or 3 weeks ago she started noticing a stinging sensation just above her ankle on the left medial aspect. She has been diagnosed with pseudogout and she wondered whether this was what she was experiencing. She tried to dress this with something she bought at the store however subsequently it pulled skin off and now she has an open  wound that is not improving. She has been using Vaseline gauze with a cover bandage. She saw her primary doctor last week who put an Haematologist on her. ABIs in this clinic was 1.03 on the left AMIAH, FROHLICH. ([id number]) 2/12; the area is on the left medial ankle. Odd-looking wound with what looks to be surface epithelialization but a multitude of small petechial openings. This clearly not closed yet. We have been using silver alginate under 3 layer compression with TCA 2/19; the wound area did not look quite as good this week. Necrotic debris over the majority of the wound surface which required debridement. She continues to have a multitude of what looked to be small petechial openings. She reminds [place] that she had a biopsy on this initially during her first outbreak in 2015 in [place] [facility]. She expresses concern about this being a possible melanoma. 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 I assured her that this does not look anything like melanoma. Except for the petechial reaction it does look like a venous insufficiency area and she certainly has evidence of this on both sides 2/26; a difficult area on the left medial ankle. The patient clearly has chronic venous hypertension with some degree of lymphedema. The odd thing about the area is the small petechial hemorrhages. I am not really sure how to explain this. This was present last time and this is not a compression injury. We have been using Hydrofera Blue which I changed to last week 3/4; still using Hydrofera Blue. Aggressive debridement today. She does not have known arterial issues. She has seen Dr.  Kellie Simmering at [facility] vein and vascular and and has an ablation on the left. [Anterior accessory branch of the greater saphenous]. From what I remember they did not feel she had an arterial issue. The patient has had this area biopsied in  2009 at [facility] dermatology and by her recollection they said this was "stasis". She is also follow-up with dermatology locally who thought that this was more of a vascular issue 3/11; using Hydrofera Blue. Aggressive debridement today. She does not have an arterial issue. We are using 3 layer compression although we may need to go to 4. The patient has been in for multiple changes to her wrap since I last saw her a week ago. She says that the area was leaking. I do not have too much more information on what was found 01/19/19 on evaluation today patient was actually being seen for a nurse visit when unfortunately she had the area on her left lateral lower extremity as well as weeping from the right lower extremity that became apparent. Therefore we did end up actually seeing her for a full visit with myself. She is having some pain at this site as well but fortunately nothing too significant at this point. No fevers, chills, nausea, or vomiting noted at this time. 3/18-Patient is back to the clinic with the left leg venous leg ulcer, the ulcer is larger in size, has a surface that is densely adherent with fibrinous tissue, the Hydrofera Blue was used but is densely adherent and there was difficulty in removing it. The right lower extremity was also wrapped for weeping edema. Patient has a new area over the left lateral foot above the malleolus that is small and appears to have no debris with intact surrounding skin. Patient is on increased dose of Lasix also as a means to edema management 3/25; the patient has a nonhealing venous ulcer on the medial left leg and last week developed a smaller area on the lateral left calf. We have been using Hydrofera Blue with a contact layer. 4/1; no major change in these wounds areas. Left medial and more recently left lateral calf. I tried Iodoflex last week to aid in debridement she did not tolerate this. She stated her pain was terrible all week. She took the  top layer of the 4 layer compression off. 4/8; the patient actually looks somewhat better in terms of her more prominent left lateral calf wound. There is some healthy looking tissue here. She is still complaining of a lot of discomfort. 4/15; patient in a lot of pain secondary to sciatica. She is on a prednisone taper prescribed by her primary physician. She has the 2 areas one on the left medial and more recently a smaller area on the left lateral calf. Both of these just above the malleoli 4/22; her back pain is better but she still states she is very uncomfortable and now feels she is intolerant to the [employer]. No real change in the wounds we have been using Sorbact. She has been previously intolerant to Iodoflex. There is not a lot of option about what we can use to debride this wound under compression that she no doubt needs. He states Ultram no longer works for her pain Objective Constitutional Sitting or standing Blood Pressure is within target range for patient.. Pulse regular and within target range for patient.Marland Kitchen Respirations regular, non-labored and within target range.. Temperature is normal and within the target range for the patient.Marland Kitchen appears in no distress. Buonocore, Ksenia J. (  [id number]) Vitals Time Taken: 8:29 AM, Height: 63 in, Weight: 224.7 lbs, BMI: 39.8, Temperature: 97.6 F, Pulse: 65 bpm, Respiratory Rate: 16 breaths/min, Blood Pressure: 142/72 mmHg. Eyes Conjunctivae clear. No discharge. Cardiovascular Pedal pulses palpable and strong bilaterally.. Her edema control is quite good. Lymphatic Not palpable in the popliteal area. Psychiatric No evidence of depression, anxiety, or agitation. Calm, cooperative, and communicative. Appropriate interactions and affect.. General Notes: Wound exam; left medial calf and left lateral calf. No debridement in either area. The vast majority of both wounds is slough covered. There is no surrounding erythema no evidence of infection  however we have not had much success in the nonmechanical debridement Integumentary (Hair, Skin) Chronic venous insufficiency and lymphedema. There is no erythema. No evidence of a significant contact or allergic phenomenon from the Unna wrap. Wound #5 status is Open. Original cause of wound was Gradually Appeared. The wound is located on the Left,Medial Lower Leg. The wound measures 3.6cm length x 2.2cm width x 0.2cm depth; 6.22cm^2 area and 1.244cm^3 volume. There is Fat Layer (Subcutaneous Tissue) Exposed exposed. There is no tunneling or undermining noted. There is a medium amount of serosanguineous drainage noted. The wound margin is indistinct and nonvisible. There is small (1-33%) red, pink granulation within the wound bed. There is a large (67-100%) amount of necrotic tissue within the wound bed including Adherent Slough. The periwound skin appearance exhibited: Maceration, Hemosiderin Staining. The periwound skin appearance did not exhibit: Callus, Crepitus, Excoriation, Induration, Rash, Scarring, Dry/Scaly, Atrophie Blanche, Cyanosis, Ecchymosis, Mottled, Pallor, Rubor, Erythema. Periwound temperature was noted as No Abnormality. The periwound has tenderness on palpation. Wound #6 status is Open. Original cause of wound was Gradually Appeared. The wound is located on the Left,Lateral Lower Leg. The wound measures 1.7cm length x 2.1cm width x 0.2cm depth; 2.804cm^2 area and 0.561cm^3 volume. There is Fat Layer (Subcutaneous Tissue) Exposed exposed. There is no tunneling or undermining noted. There is a medium amount of serous drainage noted. The wound margin is flat and intact. There is no granulation within the wound bed. There is a large (67-100%) amount of necrotic tissue within the wound bed including Adherent Slough. The periwound skin appearance exhibited: Hemosiderin Staining. The periwound skin appearance did not exhibit: Callus, Crepitus, Excoriation, Induration, Rash, Scarring,  Dry/Scaly, Maceration, Atrophie Blanche, Cyanosis, Ecchymosis, Mottled, Pallor, Rubor, Erythema. Periwound temperature was noted as No Abnormality. The periwound has tenderness on palpation. Assessment Active Problems ICD-10 Non-pressure chronic ulcer of left calf limited to breakdown of skin Chronic venous hypertension (idiopathic) with inflammation of right lower extremity Lymphedema, not elsewhere classified Diagnoses ICD-10 SHAILEY, BUTTERBAUGH ([id number]) L97.221: Non-pressure chronic ulcer of left calf limited to breakdown of skin I87.321: Chronic venous hypertension (idiopathic) with inflammation of right lower extremity I89.0: Lymphedema, not elsewhere classified Procedures Wound #5 Pre-procedure diagnosis of Wound #5 is a Lymphedema located on the Left,Medial Lower Leg . There was a Four Layer Compression Therapy Procedure with a pre-treatment ABI of 1 by Cornell Barman, RN. Post procedure Diagnosis Wound #5: Same as Pre-Procedure Plan Wound Cleansing: Wound #5 Left,Medial Lower Leg: Clean wound with Normal Saline. May Shower, gently pat wound dry prior to applying new dressing. Wound #6 Left,Lateral Lower Leg: Clean wound with Normal Saline. May Shower, gently pat wound dry prior to applying new dressing. Anesthetic (add to Medication List): Wound #5 Left,Medial Lower Leg: Topical Lidocaine 4% cream applied to wound bed prior to debridement (In Clinic Only). Wound #6 Left,Lateral Lower Leg: Topical Lidocaine 4% cream applied  to wound bed prior to debridement (In Clinic Only). Skin Barriers/Peri-Wound Care: Wound #5 Left,Medial Lower Leg: Triamcinolone Acetonide Ointment (TCA) - on wound and peri-wound Wound #6 Left,Lateral Lower Leg: Triamcinolone Acetonide Ointment (TCA) - on wound and peri-wound Primary Wound Dressing: Wound #5 Left,Medial Lower Leg: Other: - Sorbact Wound #6 Left,Lateral Lower Leg: Other: - Sorbact Secondary Dressing: Wound #5 Left,Medial Lower  Leg: ABD pad Wound #6 Left,Lateral Lower Leg: ABD pad Dressing Change Frequency: Wound #5 Left,Medial Lower Leg: Change dressing every week Wound #6 Left,Lateral Lower Leg: Change dressing every week Follow-up Appointments: Wound #5 Left,Medial Lower Leg: Return Appointment in 1 week. Nurse Visit as needed CHARLEE, SQUIBB ([id number]) Wound #6 Left,Lateral Lower Leg: Return Appointment in 1 week. Nurse Visit as needed Edema Control: Wound #5 Left,Medial Lower Leg: 4-Layer Compression System - Left Lower Extremity. Wound #6 Left,Lateral Lower Leg: 4-Layer Compression System - Left Lower Extremity. Off-Loading: Wound #5 Left,Medial Lower Leg: Other: - Elevate legs as needed Other: - Elevate legs as needed Wound #6 Left,Lateral Lower Leg: Other: - Elevate legs as needed Other: - Elevate legs as needed Medications-please add to medication list.: Wound #5 Left,Medial Lower Leg: Other: - Tramadol Wound #6 Left,Lateral Lower Leg: Other: - Tramadol 1. I would continue with the Sorbact. At this point I do not have a good option for something that will help with debridement and can be left intact all week. If the Sorbact is ineffective consider Hydrofera Blue or going back to her own stockings if she has them with Santyl 2. I have gone back to 4 layer compression in lieu of the Unna boots 3. We will review what we know about her venous status, reflux studies Electronic Signature(s) Signed: 02/25/2019 3:44:07 PM By: Linton Ham MD Entered By: Linton Ham on 02/25/2019 09:19:29 ANAROSA, KUBISIAK ([id number]) -------------------------------------------------------------------------------- SuperBill Details Patient Name: MERILYN, PAGAN. Date of Service: 02/25/2019 Medical Record Number: [id number] Patient Account Number: [id number] Date of Birth/Sex: 1946-08-09 (73 y.o. F) Treating RN: Cornell Barman Primary Care Provider: [name] Other Clinician: Referring Provider:  [name] Treating Provider/Extender: Tito Dine in Treatment: 11 Diagnosis Coding ICD-10 Codes Code Description [phone] Non-pressure chronic ulcer of left calf limited to breakdown of skin I87.321 Chronic venous hypertension (idiopathic) with inflammation of right lower extremity I89.0 Lymphedema, not elsewhere classified Facility Procedures CPT4 Code: [id number] Description: (Facility Use Only) [phone] - [place] [facility] Modifier: Quantity: 1 Physician Procedures CPT4 Code Description: [id number] - WC PHYS LEVEL 3 - EST PT ICD-10 Diagnosis Description L97.221 Non-pressure chronic ulcer of left calf limited to breakdown I87.321 Chronic venous hypertension (idiopathic) with inflammation of I89.0 Lymphedema, not  elsewhere classified Modifier: of skin right lower extr Quantity: 1 emity Electronic Signature(s) Signed: 02/25/2019 3:44:07 PM By: Linton Ham MD Entered By: Linton Ham on 02/25/2019 09:20:01

## 2019-03-02 NOTE — Progress Notes (Signed)
[name], [name] ([id number]) Visit Report for 02/25/2019 Arrival Information Details Patient Name: [name], [name]. Date of Service: 02/25/2019 8:30 AM Medical Record Number: [id number] Patient Account Number: [id number] Date of Birth/Sex: 01/21/1946 (73 y.o. F) Treating RN: [name] Primary Care [name]: [name] Other Clinician: Referring [name]: [name] Treating [name]/Extender: [name] in Treatment: 11 Visit Information History Since Last Visit Added or deleted any medications: No Patient Arrived: Ambulatory Any new allergies or adverse reactions: No Arrival Time: 08:28 Had a fall or experienced change in No Accompanied By: self activities of daily living that may affect Transfer Assistance: None risk of falls: Patient Identification Verified: Yes Signs or symptoms of abuse/neglect since last visito No Secondary Verification Process Completed: Yes Hospitalized since last visit: No Implantable device outside of the clinic excluding No cellular tissue based products placed in the center since last visit: Has Dressing in Place as Prescribed: Yes Pain Present Now: Yes Electronic Signature(s) Signed: 03/02/2019 3:31:41 PM By: [name] [name], [name], [name] Entered By: [name] on 02/25/2019 08:28:48 [name], [name] ([id number]) -------------------------------------------------------------------------------- Compression Therapy Details Patient Name: [name], [name]. Date of Service: 02/25/2019 8:30 AM Medical Record Number: [id number] Patient Account Number: [id number] Date of Birth/Sex: 12-Oct-1946 (72 y.o. F) Treating RN: [name] Primary Care [name]: [name] Other Clinician: Referring [name]: [name] Treating [name]/Extender: [name] in Treatment: 11 Compression Therapy Performed for Wound Assessment: Wound #5 Left,Medial Lower Leg Performed By: Clinician Amber Barman, RN Compression Type: Four Layer Pre Treatment ABI: 1 Post Procedure Diagnosis Same as Pre-procedure Electronic Signature(s) Signed: 02/25/2019 4:31:15 PM By: [name], [name], RN, [name], [name], [name] Entered By: [name], [name], RN, [name], [name] on 02/25/2019 08:54:14 [name] Franklin ([id number]) -------------------------------------------------------------------------------- Encounter Discharge Information Details Patient Name: [name], [name]. Date of Service: 02/25/2019 8:30 AM Medical Record Number: [id number] Patient Account Number: [id number] Date of Birth/Sex: 1946-06-25 (73 y.o. F) Treating RN: [name] Primary Care [name]: [name] Other Clinician: Referring Niharika Savino: [name] Treating [name]/Extender: [name] in Treatment: 11 Encounter Discharge Information Items Discharge Condition: Stable Ambulatory Status: Ambulatory Discharge Destination: Home Transportation: Private Auto Accompanied By: self Schedule Follow-up Appointment: Yes Clinical Summary of Care: Electronic Signature(s) Signed: 03/02/2019 4:10:42 PM By: [name] Entered By: [name] on 02/25/2019 09:11:34 [name], [name] ([id number]) -------------------------------------------------------------------------------- Lower Extremity Assessment Details Patient Name: [name], [name]. Date of Service: 02/25/2019 8:30 AM Medical Record Number: [id number] Patient Account Number: [id number] Date of Birth/Sex: [birth year] (73 y.o. F) Treating RN: [name] Primary Care Revella Shelton: [name] Other Clinician: Referring Jozef Eisenbeis: [name] Treating [name]/Extender: [name] in Treatment: 11 Edema Assessment Assessed: [Left: No] [Right: No] Edema: [Left: Ye] [Right: s] Calf Left: Right: Point of Measurement: 31 cm From Medial Instep 41 cm cm Ankle Left: Right: Point of Measurement: 13 cm From Medial Instep 23.5 cm cm Vascular  Assessment Pulses: Dorsalis Pedis Palpable: [Left:Yes] Electronic Signature(s) Signed: 02/25/2019 2:28:39 PM By: [name] Entered By: [name] on 02/25/2019 08:38:38 [name], [name] ([id number]) -------------------------------------------------------------------------------- Multi Wound Chart Details Patient Name: [name], [name]. Date of Service: 02/25/2019 8:30 AM Medical Record Number: [id number] Patient Account Number: [id number] Date of Birth/Sex: 02-28-1946 (73 y.o. F) Treating RN: [name] Primary Care Ifeanyi Mickelson: [name] Other Clinician: Referring Lexia Vandevender: [name] Treating Aizik Reh/Extender: [name] in Treatment: 11 Vital Signs Height(in): 63 Pulse(bpm): 65 Weight(lbs): 224.7 Blood Pressure(mmHg): 142/72 Body Mass Index(BMI): 40 Temperature(F): 97.6 Respiratory  Rate 16 (breaths/min): Photos: [N/A:N/A] Wound Location: Left Lower Leg - Medial Left Lower Leg - Lateral N/A Wounding Event: Gradually Appeared Gradually Appeared N/A Primary Etiology: Lymphedema Venous Leg Ulcer N/A Comorbid History: Cataracts, Asthma, Sleep Cataracts, Asthma, Sleep N/A Apnea, Deep Vein Apnea, Deep Vein Thrombosis, Hypertension, Thrombosis, Hypertension, Peripheral Venous Disease, Peripheral Venous Disease, Osteoarthritis, Received Osteoarthritis, Received Chemotherapy, Received Chemotherapy, Received Radiation Radiation Date Acquired: 11/19/2018 01/19/2019 N/A Weeks of Treatment: 11 5 N/A Wound Status: Open Open N/A Measurements L x W x D 3.6x2.2x0.2 1.7x2.1x0.2 N/A (cm) Area (cm) : 6.22 2.804 N/A Volume (cm) : 1.244 0.561 N/A % Reduction in Area: 27.20% -1174.50% N/A % Reduction in Volume: -45.50% -2450.00% N/A Classification: Full Thickness Without Full Thickness Without N/A Exposed Support Structures Exposed Support Structures Exudate Amount: Medium Medium N/A Exudate Type: Serosanguineous Serous N/A Exudate Color: red, brown  amber N/A Wound Margin: Indistinct, nonvisible Flat and Intact N/A Granulation Amount: Small (1-33%) None Present (0%) N/A Granulation Quality: Red, Pink N/A N/A Necrotic Amount: Large (67-100%) Large (67-100%) N/A Exposed Structures: N/A [name], [name] ([id number]) Fat Layer (Subcutaneous Fat Layer (Subcutaneous Tissue) Exposed: Yes Tissue) Exposed: Yes Fascia: No Fascia: No Tendon: No Tendon: No Muscle: No Muscle: No Joint: No Joint: No Bone: No Bone: No Epithelialization: Small (1-33%) None N/A Periwound Skin Texture: Excoriation: No Excoriation: No N/A Induration: No Induration: No Callus: No Callus: No Crepitus: No Crepitus: No Rash: No Rash: No Scarring: No Scarring: No Periwound Skin Moisture: Maceration: Yes Maceration: No N/A Dry/Scaly: No Dry/Scaly: No Periwound Skin Color: Hemosiderin Staining: Yes Hemosiderin Staining: Yes N/A Atrophie Blanche: No Atrophie Blanche: No Cyanosis: No Cyanosis: No Ecchymosis: No Ecchymosis: No Erythema: No Erythema: No Mottled: No Mottled: No Pallor: No Pallor: No Rubor: No Rubor: No Temperature: No Abnormality No Abnormality N/A Tenderness on Palpation: Yes Yes N/A Procedures Performed: Compression Therapy N/A N/A Treatment Notes Wound #5 (Left, Medial Lower Leg) Notes sorbact, ABD, 4-Layer Left leg Wound #6 (Left, Lateral Lower Leg) Notes sorbact, ABD, 4-Layer Left leg Electronic Signature(s) Signed: 02/25/2019 3:44:07 PM By: Linton Ham MD Entered By: Linton Ham on 02/25/2019 09:14:27 [name] Franklin ([id number]) -------------------------------------------------------------------------------- Multi-Disciplinary Care Plan Details Patient Name: [name], [name]. Date of Service: 02/25/2019 8:30 AM Medical Record Number: [id number] Patient Account Number: [id number] Date of Birth/Sex: 04-[name]-1946 (73 y.o. F) Treating RN: [name] Primary Care Kashis Penley: [name] Other  Clinician: Referring Devaney Segers: [name] Treating Tarvis Blossom/Extender: [name] in Treatment: 11 Active Inactive Orientation to the Wound Care Program Nursing Diagnoses: Knowledge deficit related to the wound healing center program Goals: Patient/caregiver will verbalize understanding of the [facility] Program Date Initiated: 12/10/2018 Target Resolution Date: 01/09/2019 Goal Status: Active Interventions: Provide education on orientation to the wound center Notes: Soft Tissue Infection Nursing Diagnoses: Impaired tissue integrity Goals: Patient's soft tissue infection will resolve Date Initiated: 12/10/2018 Target Resolution Date: 01/09/2019 Goal Status: Active Interventions: Assess signs and symptoms of infection every visit Notes: Venous Leg Ulcer Nursing Diagnoses: Actual venous Insuffiency (use after diagnosis is confirmed) Goals: Patient will maintain optimal edema control Date Initiated: 12/10/2018 Target Resolution Date: 01/09/2019 Goal Status: Active Interventions: Assess peripheral edema status every visit. RAYLYNNE, CUBBAGE ([id number]) Treatment Activities: Therapeutic compression applied : 12/10/2018 Notes: Wound/Skin Impairment Nursing Diagnoses: Impaired tissue integrity Goals: Patient/caregiver will verbalize understanding of skin care regimen Date Initiated: 12/10/2018 Target Resolution Date: 01/09/2019 Goal Status: Active Interventions: Assess ulceration(s) every visit Treatment Activities: Topical wound management initiated : 12/10/2018 Notes: Electronic Signature(s)  Signed: 02/25/2019 4:31:15 PM By: [name], [name], RN, [name], [name], [name] Entered By: [name], [name], RN, [name], [name] on 02/25/2019 08:51:26 JHAYLA, PODGORSKI ([id number]) -------------------------------------------------------------------------------- Pain Assessment Details Patient Name: [name], [name]. Date of Service: 02/25/2019 8:30 AM Medical Record Number: [id number] Patient  Account Number: [id number] Date of Birth/Sex: [birth year] (73 y.o. F) Treating RN: [name] Primary Care Serenna Deroy: [name] Other Clinician: Referring Dodger Sinning: [name] Treating Arwa Yero/Extender: [name] in Treatment: 11 Active Problems Location of Pain Severity and Description of Pain Patient Has Paino Yes Site Locations Rate the pain. Current Pain Level: 0 Worst Pain Level: 8 Least Pain Level: 0 Pain Management and Medication Current Pain Management: Electronic Signature(s) Signed: 02/25/2019 4:31:15 PM By: [name], [name], RN, [name], [name], [name] Signed: 03/02/2019 3:31:41 PM By: [name] [name], [name], [name] Entered By: [name] on 02/25/2019 08:29:32 [name], [name] ([id number]) -------------------------------------------------------------------------------- Patient/Caregiver Education Details Patient Name: [name], [name]. Date of Service: 02/25/2019 8:30 AM Medical Record Number: [id number] Patient Account Number: [id number] Date of Birth/Gender: 1946-01-27 (73 y.o. F) Treating RN: [name] Primary Care Physician: [name] Other Clinician: Referring Physician: Ria [name] Treating Physician/Extender: [name] in Treatment: 11 Education Assessment Education Provided To: Patient Education Topics Provided Venous: Handouts: Controlling Swelling with Multilayered Compression Wraps Methods: Demonstration, Explain/Verbal Responses: State content correctly Wound/Skin Impairment: Handouts: Caring for Your Ulcer, Other: Wound care as prescribed Methods: Demonstration, Explain/Verbal Electronic Signature(s) Signed: 02/25/2019 4:31:15 PM By: [name], [name], RN, [name], [name], [name] Entered By: [name], [name], RN, [name], [name] on 02/25/2019 08:59:18 [name] [place] ([id number]) -------------------------------------------------------------------------------- Wound Assessment Details Patient Name: [name], [name]. Date of Service: 02/25/2019 8:30 AM Medical Record Number: [id number] Patient Account Number: [id number] Date of Birth/Sex: July 14, 1946 (72 y.o. F) Treating RN: [name] Primary Care Candido Flott: [name] Other Clinician: Referring Meila Berke: [name] Treating Dezerae Freiberger/Extender: [name] in Treatment: 11 Wound Status Wound Number: 5 Primary Lymphedema Etiology: Wound Location: Left Lower Leg - Medial Wound Open Wounding Event: Gradually Appeared Status: Date Acquired: 11/19/2018 Comorbid Cataracts, Asthma, Sleep Apnea, Deep Vein Weeks Of Treatment: 11 History: Thrombosis, Hypertension, Peripheral Venous Clustered Wound: No Disease, Osteoarthritis, Received Chemotherapy, Received Radiation Photos Wound Measurements Length: (cm) 3.6 Width: (cm) 2.2 Depth: (cm) 0.2 Area: (cm) 6.22 Volume: (cm) 1.244 % Reduction in Area: 27.2% % Reduction in Volume: -45.5% Epithelialization: Small (1-33%) Tunneling: No Undermining: No Wound Description Full Thickness Without Exposed Support Classification: Structures Wound Margin: Indistinct, nonvisible Exudate Medium Amount: Exudate Type: Serosanguineous Exudate Color: red, brown Foul Odor After Cleansing: No Slough/Fibrino Yes Wound Bed Granulation Amount: Small (1-33%) Exposed Structure Granulation Quality: Red, Pink Fascia Exposed: No Necrotic Amount: Large (67-100%) Fat Layer (Subcutaneous Tissue) Exposed: Yes Necrotic Quality: Adherent Slough Tendon Exposed: No Muscle Exposed: No Joint Exposed: No Bone Exposed: No Loewenstein, Nuha J. ([id number]) Periwound Skin Texture Texture Color No Abnormalities Noted: No No Abnormalities Noted: No Callus: No Atrophie Blanche: No Crepitus: No Cyanosis: No Excoriation: No Ecchymosis: No Induration: No Erythema: No Rash: No Hemosiderin Staining: Yes Scarring: No Mottled: No Pallor: No Moisture Rubor: No No Abnormalities Noted:  No Dry / Scaly: No Temperature / Pain Maceration: Yes Temperature: No Abnormality Tenderness on Palpation: Yes Treatment Notes Wound #5 (Left, Medial Lower Leg) Notes sorbact, ABD, 4-Layer Left leg Electronic Signature(s) Signed: 02/25/2019 2:28:39 PM By: [name] Entered By: [name] on 02/25/2019 08:45:59 Bufkin, [name] ([id number]) -------------------------------------------------------------------------------- Wound Assessment Details Patient Name: [name], [name].  Date of Service: 02/25/2019 8:30 AM Medical Record Number: [id number] Patient Account Number: [id number] Date of Birth/Sex: 07-08-1946 (73 y.o. F) Treating RN: [name] Primary Care Jaxin Fulfer: [name] Other Clinician: Referring Yara Tomkinson: [name] Treating Chong January/Extender: [name] in Treatment: 11 Wound Status Wound Number: 6 Primary Venous Leg Ulcer Etiology: Wound Location: Left Lower Leg - Lateral Wound Open Wounding Event: Gradually Appeared Status: Date Acquired: 01/19/2019 Comorbid Cataracts, Asthma, Sleep Apnea, Deep Vein Weeks Of Treatment: 5 History: Thrombosis, Hypertension, Peripheral Venous Clustered Wound: No Disease, Osteoarthritis, Received Chemotherapy, Received Radiation Photos Wound Measurements Length: (cm) 1.7 Width: (cm) 2.1 Depth: (cm) 0.2 Area: (cm) 2.804 Volume: (cm) 0.561 % Reduction in Area: -1174.5% % Reduction in Volume: -2450% Epithelialization: None Tunneling: No Undermining: No Wound Description Full Thickness Without Exposed Support Foul O Classification: Structures Slough Wound Margin: Flat and Intact Exudate Medium Amount: Exudate Type: Serous Exudate Color: amber dor After Cleansing: No /Fibrino Yes Wound Bed Granulation Amount: None Present (0%) Exposed Structure Necrotic Amount: Large (67-100%) Fascia Exposed: No Necrotic Quality: Adherent Slough Fat Layer (Subcutaneous Tissue) Exposed: Yes Tendon  Exposed: No Muscle Exposed: No Joint Exposed: No Bone Exposed: No Fulginiti, Jeaninne J. ([id number]) Periwound Skin Texture Texture Color No Abnormalities Noted: No No Abnormalities Noted: No Callus: No Atrophie Blanche: No Crepitus: No Cyanosis: No Excoriation: No Ecchymosis: No Induration: No Erythema: No Rash: No Hemosiderin Staining: Yes Scarring: No Mottled: No Pallor: No Moisture Rubor: No No Abnormalities Noted: No Dry / Scaly: No Temperature / Pain Maceration: No Temperature: No Abnormality Tenderness on Palpation: Yes Treatment Notes Wound #6 (Left, Lateral Lower Leg) Notes sorbact, ABD, 4-Layer Left leg Electronic Signature(s) Signed: 02/25/2019 2:28:39 PM By: [name] Entered By: [name] on 02/25/2019 08:46:32 Honse, [name] ([id number]) -------------------------------------------------------------------------------- Vitals Details Patient Name: LASHEKA, KEMPNER. Date of Service: 02/25/2019 8:30 AM Medical Record Number: [id number] Patient Account Number: [id number] Date of Birth/Sex: December 18, 1945 (73 y.o. F) Treating RN: [name] Primary Care Bridgett Hattabaugh: [name] Other Clinician: Referring Manali Mcelmurry: [name] Treating Refugio Vandevoorde/Extender: [name] in Treatment: 11 Vital Signs Time Taken: 08:29 Temperature (F): 97.6 Height (in): 63 Pulse (bpm): 65 Weight (lbs): 224.7 Respiratory Rate (breaths/min): 16 Body Mass Index (BMI): 39.8 Blood Pressure (mmHg): 142/72 Reference Range: 80 - 120 mg / dl Electronic Signature(s) Signed: 03/02/2019 3:31:41 PM By: [name] [name], [name], [name] Entered By: [name] on 02/25/2019 08:32:04

## 2019-03-04 ENCOUNTER — Encounter: Payer: Medicare Other | Admitting: Internal Medicine

## 2019-03-04 ENCOUNTER — Other Ambulatory Visit: Payer: Self-pay

## 2019-03-04 DIAGNOSIS — L97221 Non-pressure chronic ulcer of left calf limited to breakdown of skin: Secondary | ICD-10-CM | POA: Diagnosis not present

## 2019-03-04 DIAGNOSIS — L97222 Non-pressure chronic ulcer of left calf with fat layer exposed: Secondary | ICD-10-CM | POA: Diagnosis not present

## 2019-03-04 DIAGNOSIS — I87312 Chronic venous hypertension (idiopathic) with ulcer of left lower extremity: Secondary | ICD-10-CM | POA: Diagnosis not present

## 2019-03-04 DIAGNOSIS — J45909 Unspecified asthma, uncomplicated: Secondary | ICD-10-CM | POA: Diagnosis not present

## 2019-03-04 DIAGNOSIS — G4733 Obstructive sleep apnea (adult) (pediatric): Secondary | ICD-10-CM | POA: Diagnosis not present

## 2019-03-04 DIAGNOSIS — I10 Essential (primary) hypertension: Secondary | ICD-10-CM | POA: Diagnosis not present

## 2019-03-04 DIAGNOSIS — I890 Lymphedema, not elsewhere classified: Secondary | ICD-10-CM | POA: Diagnosis not present

## 2019-03-04 DIAGNOSIS — I87302 Chronic venous hypertension (idiopathic) without complications of left lower extremity: Secondary | ICD-10-CM | POA: Diagnosis not present

## 2019-03-05 NOTE — Progress Notes (Signed)
[name], [name] ([id number]) Visit Report for 03/04/2019 HPI Details Patient Name: [name], [name]. Date of Service: 03/04/2019 11:15 AM Medical Record Number: [id number] Patient Account Number: [id number] Date of Birth/Sex: 21-Feb-1946 (73 y.o. F) Treating RN: Harold Barban Primary Care Provider: [name] Other Clinician: Referring Provider: [name] Treating Provider/Extender: Tito Dine in Treatment: 12 History of Present Illness HPI Description: Pleasant 73 year old with history of chronic venous insufficiency. No diabetes or peripheral vascular disease. Left ABI 1.29. Questionable history of left lower extremity DVT. She developed a recurrent ulceration on her left lateral calf in December 2015, which she attributes to poor diet and subsequent lower extremity edema. She underwent endovenous laser ablation of her left greater saphenous vein in 2010. She underwent laser ablation of accessory branch of left GSV in April 2016 by Dr. Kellie Simmering at [facility]. She was previously wearing Unna boots, which she tolerated well. Tolerating 2 layer compression and cadexomer iodine. She returns to clinic for follow-up and is without new complaints. She denies any significant pain at this time. She reports persistent pain with pressure. No claudication or ischemic rest pain. No fever or chills. No drainage. READMISSION 11/13/16; this is a 73 year old woman who is not a diabetic. She is here for a review of a painful area on her left medial lower extremity. I note that she was seen here previously last year for wound I believe to be in the same area. At that time she had undergone previously a left greater saphenous vein ablation by Dr. Kellie Simmering and she had a ablation of the anterior accessory branch of the left greater saphenous vein in March 2016. Seeing that the wound actually closed over. In reviewing the history with her today the ulcer in this area has been recurrent. She  describes a biopsy of this area in 2009 that only showed stasis physiology. 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 Do not see that she has ever had arterial studies in the left leg. She tells me she has a follow-up with Dr. Kellie Simmering in roughly 10 days In any case she developed the reopening of this area roughly a month ago. On the background of this she describes rapidly increasing edema which has responded to Lasix 40 mg and metolazone 2.5 mg as well as the patient's lymph massage. She has been told she has both venous insufficiency and lymphedema but she cannot tolerate compression stockings 11/28/16; the patient saw Dr. Kellie Simmering recently. Per the patient he did arterial Dopplers in the office that did not show evidence of arterial insufficiency, per the patient he stated "treat this like an ordinary venous ulcer". She also saw her dermatologist Dr. Ronnald Ramp who felt that this was more of a vascular ulcer. In general things are improving although she arrives today with increasing bilateral lower extremity edema with weeping a deeper fluid through the wound on the left medial leg compatible with some degree of lymphedema 12/04/16; the patient's wound is fully epithelialized but I don't think fully healed. We will do another week of depression with Promogran and TCA however I suspect we'll be able to discharge her next week. This is a very unusual-looking wound which was initially a figure-of-eight type wound lying on its side surrounded by petechial like hemorrhage. She  has had venous ablation on this side. She apparently does not have an arterial issue per Dr. Kellie Simmering. She saw her dermatologist thought it was "vascular". Patient is definitely going to need ongoing compression and I  talked about this with her today she will go to elastic therapy after she leaves here next week 12/11/16; the patient's wound is not completely closed today. She has surrounding scar tissue and in further discussion with the patient it would appear that she had ulcers in this area in 2009 for a prolonged period of time ultimately requiring a punch biopsy of this area that only showed venous insufficiency. I did not previously pickup on this part of the history from the patient. 12/18/16; the patient's wound is completely epithelialized. There is no open area here. She has significant bilateral venous insufficiency with secondary lymphedema to a mild-to-moderate degree she does not have compression stockings.. She did not say anything to me when I was in the room, she told our intake nurse that she was still having pain in this area. This isn't [name], [name] ([id number]) unusual recurrent small open area. She is going to go to elastic therapy to obtain compression stockings. 12/25/16; the patient's wound is fully epithelialized. There is no open area here. The patient describes some continued episodic discomfort in this area medial left calf. However everything looks fine and healed here. She is been to elastic therapy and caught herself 15-20 mmHg stockings, they apparently were having trouble getting 20-30 mm stockings in her size 01/22/17; this is a patient we discharged from the clinic a month ago. She has a recurrent open wound on her medial left calf. She had 15 mm support stockings. I told her I thought she needed 20-30 mm compression stockings. She tells me that she has been ill with hospitalization secondary to asthma and is been found to have severe hypokalemia likely secondary to a combination of Lasix and metolazone. This morning she noted blistering and leaking fluid on the posterior part of her left leg. She called our intake nurse urgently and we was saw her this afternoon. She has not  had any real discomfort here. I don't know that she's been wearing any stockings on this leg for at least 2-3 days. ABIs in this clinic were 1.21 on the right and 1.3 on the left. She is previously seen vascular surgery who does not think that there is a peripheral arterial issue. 01/30/17; Patient arrives with no open wound on the left leg. She has been to elastic therapy and obtained 20-24mmhg below knee stockings and she has one on the right leg today. READMISSION 02/19/18; this Ruud is a now 73 year old patient we've had in this clinic perhaps 3 times before. I had last looked at her from January 07 December 2016 with an area on the medial left leg. We discharged her on 12/25/16 however she had to be readmitted on 01/22/17 with a recurrence. I have in my notes that we discharged her on 20-30 mm stockings although she tells me she was only wearing support hose because she cannot get stockings on predominantly related to her cervical spine surgery/issues. She has had previous ablations done by vein and vascular in [place] including a great saphenous vein ablation on the left with an anterior accessory branch ablation I think both of these were in 2016. On one of the previous visit she had a biopsy noted 2009 that was negative. She is not felt to have an arterial issue. She is not  a diabetic. She does have a history of obstructive sleep apnea hypertension asthma as well as chronic venous insufficiency and lymphedema. On this occasion she noted 2 dry scaly patch on her left leg. She tried to put lotion on this it didn't really help. There were 2 open areas.the patient has been seeing her primary physician from 02/05/18 through 02/14/18. She had Unna boots applied. The superior wound now on the lateral left leg has closed but she's had one wound that remains open on the lateral left leg. This is not the same spot as we dealt with in 2018. ABIs in this clinic were 1.3 bilaterally 02/26/18; patient has a  small wound on the left lateral calf. Dimensions are down. She has chronic venous insufficiency and lymphedema. 03/05/18; small open area on the left lateral calf. Dimensions are down. Tightly adherent necrotic debris over the surface of the wound which was difficult to remove. Also the dressing [over collagen] stuck to the wound surface. This was removed with some difficulty as well. Change the primary dressing to Hydrofera Blue ready 03/12/18; small open area on the left lateral calf. Comes in with tightly adherent surface eschar as well as some adherent Hydrofera Blue. 03/19/18; open area on the left lateral calf. Again adherent surface eschar as well as some adherent Hydrofera Blue nonviable subcutaneous tissue. She complained of pain all week even with the reduction from 4-3 layer compression I put on last week. Also she had an increase in her ankle and calf measurements probably related to the same thing. 03/26/18; open area on the left lateral calf. A very small open area remains here. We used silver alginate starting last week as the Hydrofera Blue seem to stick to the wound bed. In using 4-layer compression 04/02/18; the open area in the left lateral calf at some adherent slough which I removed there is no open area here. We are able to transition her into her own compression stocking. Truthfully I think this is probably his support hose. However this does not maintain skin integrity will be limited. She cannot put over the toe compression stockings on because of neck problems hand problems etc. She is allergic to the lining layer of juxta lites. We might be forced to use extremitease stocking should this fail READMIT 11/24/2018 Patient is now a 73 year old woman who is not a diabetic. She has been in this clinic on at least 3 previous occasions largely with recurrent wounds on her left leg secondary to chronic venous insufficiency with secondary lymphedema. Her situation is complicated by  inability to get stockings on and an allergy to neoprene which is apparently a component and at least juxta lites and other stockings. As a result she really has not been wearing any stockings on her legs. She tells [place] that roughly 2 or 3 weeks ago she started noticing a stinging sensation just above her ankle on the left medial aspect. She has been diagnosed with pseudogout and she wondered whether this was what she was experiencing. She tried to dress this with something she bought at the store however subsequently it pulled skin off and now she has an open wound that is not improving. She has been using Vaseline gauze with a cover bandage. She saw her primary doctor last week who [name], [name] ([id number]) put an Unna boot on her. ABIs in this clinic was 1.03 on the left 2/12; the area is on the left medial ankle. Odd-looking wound with what looks to be surface epithelialization  but a multitude of small petechial openings. This clearly not closed yet. We have been using silver alginate under 3 layer compression with TCA 2/19; the wound area did not look quite as good this week. Necrotic debris over the majority of the wound surface which required debridement. She continues to have a multitude of what looked to be small petechial openings. She reminds [place] that she had a biopsy on this initially during her first outbreak in 2015 in [place] [facility]. She expresses concern about this being a possible melanoma. 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 I assured her that this does not look anything like melanoma. Except for the petechial reaction it does look like a venous insufficiency area and she certainly has evidence of this on both sides 2/26; a difficult area on the left medial ankle. The patient clearly has chronic venous hypertension with some degree of lymphedema. The odd thing about the area is  the small petechial hemorrhages. I am not really sure how to explain this. This was present last time and this is not a compression injury. We have been using Hydrofera Blue which I changed to last week 3/4; still using Hydrofera Blue. Aggressive debridement today. She does not have known arterial issues. She has seen Dr. Kellie Simmering at [facility] vein [facility] vascular and and has an ablation on the left. [Anterior accessory branch of the greater saphenous]. From what I remember they did not feel she had an arterial issue. The patient has had this area biopsied in 2009 at [facility] dermatology and by her recollection they said this was "stasis". She is also follow-up with dermatology locally who thought that this was more of a vascular issue 3/11; using Hydrofera Blue. Aggressive debridement today. She does not have an arterial issue. We are using 3 layer compression although we may need to go to 4. The patient has been in for multiple changes to her wrap since I last saw her a week ago. She says that the area was leaking. I do not have too much more information on what was found 01/19/19 on evaluation today patient was actually being seen for a nurse visit when unfortunately she had the area on her left lateral lower extremity as well as weeping from the right lower extremity that became apparent. Therefore we did end up actually seeing her for a full visit with myself. She is having some pain at this site as well but fortunately nothing too significant at this point. No fevers, chills, nausea, or vomiting noted at this time. 3/18-Patient is back to the clinic with the left leg venous leg ulcer, the ulcer is larger in size, has a surface that is densely adherent with fibrinous tissue, the Hydrofera Blue was used but is densely adherent and there was difficulty in removing it. The right lower extremity was also wrapped for weeping edema. Patient has a new area over the left lateral foot above the malleolus  that is small and appears to have no debris with intact surrounding skin. Patient is on increased dose of Lasix also as a means to edema management 3/25; the patient has a nonhealing venous ulcer on the medial left leg and last week developed a smaller area on the lateral left calf. We have been using Hydrofera Blue with a contact layer. 4/1; no major change in these wounds areas. Left medial and more recently left lateral calf. I tried Iodoflex last week  to aid in debridement she did not tolerate this. She stated her pain was terrible all week. She took the top layer of the 4 layer compression off. 4/8; the patient actually looks somewhat better in terms of her more prominent left lateral calf wound. There is some healthy looking tissue here. She is still complaining of a lot of discomfort. 4/15; patient in a lot of pain secondary to sciatica. She is on a prednisone taper prescribed by her primary physician. She has the 2 areas one on the left medial and more recently a smaller area on the left lateral calf. Both of these just above the malleoli 4/22; her back pain is better but she still states she is very uncomfortable and now feels she is intolerant to the [employer]. No real change in the wounds we have been using Sorbact. She has been previously intolerant to Iodoflex. There is not a lot of option about what we can use to debride this wound under compression that she no doubt needs. sHe states Ultram no longer works for her pain 4/29; no major change in the wounds slightly increased depth. Surface on the original medial wound perhaps somewhat improved however the more recent area on the lateral left ankle is 100% covered in very adherent debris we have been using Sorbact. She tolerates 4 layer compression well and her edema control is a lot better. She has not had to come in for a nurse check Electronic Signature(s) Signed: 03/05/2019 7:17:18 AM By: Linton Ham MD Entered By: Linton Ham on 03/04/2019 11:38:02 [name], [name] ([id number]) -------------------------------------------------------------------------------- Physical Exam Details Patient Name: [name], [name]. Date of Service: 03/04/2019 11:15 AM Medical Record Number: [id number] Patient Account Number: [id number] Date of Birth/Sex: December 23, 1945 (73 y.o. F) Treating RN: Harold Barban Primary Care Provider: [name] Other Clinician: Referring Provider: [name] Treating Provider/Extender: Tito Dine in Treatment: 12 Constitutional Sitting or standing Blood Pressure is within target range for patient.. Pulse regular and within target range for patient.Marland Kitchen Respirations regular, non-labored and within target range.. Temperature is normal and within the target range for the patient.Marland Kitchen appears in no distress. Cardiovascular Pedal pulses are palpable on the left. Notes Wound exam; left medial calf and left lateral calf. No debridement was allowed by the patient in either area although both require it. In fact we can barely get her to tolerate washing the wound surface. The medial area has some depth with tightly adherent debris at the bottom. The lateral wound which was more recent is 100% covered and tightly adherent debris. There is no surrounding erythema no evidence of infection. Electronic Signature(s) Signed: 03/05/2019 7:17:18 AM By: Linton Ham MD Entered By: Linton Ham on 03/04/2019 11:43:45 Massing, [name] ([id number]) -------------------------------------------------------------------------------- Physician Orders Details Patient Name: ANESHIA, JACQUET. Date of Service: 03/04/2019 11:15 AM Medical Record Number: [id number] Patient Account Number: [id number] Date of Birth/Sex: May 02, 1946 (73 y.o. F) Treating RN: Harold Barban Primary Care Provider: [name] Other Clinician: Referring Provider: [name] Treating Provider/Extender: Tito Dine in Treatment: 12 Verbal / Phone Orders: No Diagnosis Coding Wound Cleansing Wound #5 Left,Medial Lower Leg o Clean wound with Normal Saline. o May Shower, gently pat wound dry prior to applying new dressing. Wound #6 Left,Lateral Lower Leg o Clean wound with Normal Saline. o May Shower, gently pat wound dry prior to applying new dressing. Anesthetic (add to Medication List) Wound #5 Left,Medial Lower Leg o Topical Lidocaine 4% cream applied to wound bed prior  to debridement (In Clinic Only). Wound #6 Left,Lateral Lower Leg o Topical Lidocaine 4% cream applied to wound bed prior to debridement (In Clinic Only). Skin Barriers/Peri-Wound Care Wound #5 Left,Medial Lower Leg o Triamcinolone Acetonide Ointment (TCA) - on wound and peri-wound Wound #6 Left,Lateral Lower Leg o Triamcinolone Acetonide Ointment (TCA) - on wound and peri-wound Primary Wound Dressing Wound #5 Left,Medial Lower Leg o Other: - Sorbact Wound #6 Left,Lateral Lower Leg o Other: - Sorbact Secondary Dressing Wound #5 Left,Medial Lower Leg o ABD pad Wound #6 Left,Lateral Lower Leg o ABD pad Dressing Change Frequency Wound #5 Left,Medial Lower Leg o Change dressing every week Wound #6 Left,Lateral Lower Leg o Change dressing every week KARLYN, GLASCO ([id number]) Follow-up Appointments Wound #5 Left,Medial Lower Leg o Return Appointment in 1 week. o Nurse Visit as needed Wound #6 Left,Lateral Lower Leg o Return Appointment in 1 week. o Nurse Visit as needed Edema Control Wound #5 Left,Medial Lower Leg o 4-Layer Compression System - Left Lower Extremity. Wound #6 Left,Lateral Lower Leg o 4-Layer Compression System - Left Lower Extremity. Off-Loading Wound #5 Left,Medial Lower Leg o Other: - Elevate legs as needed o Other: - Elevate legs as needed Wound #6 Left,Lateral Lower Leg o Other: - Elevate legs as needed o Other: - Elevate legs as  needed Medications-please add to medication list. Wound #5 Left,Medial Lower Leg o Other: - Tramadol Wound #6 Left,Lateral Lower Leg o Other: - Tramadol Electronic Signature(s) Signed: 03/04/2019 4:45:14 PM By: Harold Barban Signed: 03/05/2019 7:17:18 AM By: Linton Ham MD Entered By: Harold Barban on 03/04/2019 11:27:57 Levett, [name] ([id number]) -------------------------------------------------------------------------------- Problem List Details Patient Name: SHARY, LAMOS. Date of Service: 03/04/2019 11:15 AM Medical Record Number: [id number] Patient Account Number: [id number] Date of Birth/Sex: [birth year] (73 y.o. F) Treating RN: Harold Barban Primary Care Provider: [name] Other Clinician: Referring Provider: [name] Treating Provider/Extender: Tito Dine in Treatment: 12 Active Problems ICD-10 Evaluated Encounter Code Description Active Date Today Diagnosis L97.221 Non-pressure chronic ulcer of left calf limited to breakdown of 01/07/2019 No Yes skin I87.321 Chronic venous hypertension (idiopathic) with inflammation of 12/10/2018 No Yes right lower extremity I89.0 Lymphedema, not elsewhere classified 12/10/2018 No Yes Inactive Problems Resolved Problems Electronic Signature(s) Signed: 03/05/2019 7:17:18 AM By: Linton Ham MD Entered By: Linton Ham on 03/04/2019 11:34:18 [name], [name] ([id number]) -------------------------------------------------------------------------------- Progress Note Details Patient Name: ZENA, VITELLI. Date of Service: 03/04/2019 11:15 AM Medical Record Number: [id number] Patient Account Number: [id number] Date of Birth/Sex: 1946/05/01 (73 y.o. F) Treating RN: Harold Barban Primary Care Provider: [name] Other Clinician: Referring Provider: [name] Treating Provider/Extender: Tito Dine in Treatment: 12 Subjective History of Present Illness (HPI) Pleasant  73 year old with history of chronic venous insufficiency. No diabetes or peripheral vascular disease. Left ABI 1.29. Questionable history of left lower extremity DVT. She developed a recurrent ulceration on her left lateral calf in December 2015, which she attributes to poor diet and subsequent lower extremity edema. She underwent endovenous laser ablation of her left greater saphenous vein in 2010. She underwent laser ablation of accessory branch of left GSV in April 2016 by Dr. Kellie Simmering at [facility]. She was previously wearing Unna boots, which she tolerated well. Tolerating 2 layer compression and cadexomer iodine. She returns to clinic for follow-up and is without new complaints. She denies any significant pain at this time. She reports persistent pain with pressure. No claudication or ischemic rest pain. No fever or chills. No drainage. READMISSION  11/13/16; this is a 73 year old woman who is not a diabetic. She is here for a review of a painful area on her left medial lower extremity. I note that she was seen here previously last year for wound I believe to be in the same area. At that time she had undergone previously a left greater saphenous vein ablation by Dr. Kellie Simmering and she had a ablation of the anterior accessory branch of the left greater saphenous vein in March 2016. Seeing that the wound actually closed over. In reviewing the history with her today the ulcer in this area has been recurrent. She describes a biopsy of this area in 2009 that only showed stasis physiology. 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 Do not see that she has ever had arterial studies in the left leg. She tells me she has a follow-up with Dr. Kellie Simmering in roughly 10 days In any case she developed  the reopening of this area roughly a month ago. On the background of this she describes rapidly increasing edema which has responded to Lasix 40 mg and metolazone 2.5 mg as well as the patient's lymph massage. She has been told she has both venous insufficiency and lymphedema but she cannot tolerate compression stockings 11/28/16; the patient saw Dr. Kellie Simmering recently. Per the patient he did arterial Dopplers in the office that did not show evidence of arterial insufficiency, per the patient he stated "treat this like an ordinary venous ulcer". She also saw her dermatologist Dr. Ronnald Ramp who felt that this was more of a vascular ulcer. In general things are improving although she arrives today with increasing bilateral lower extremity edema with weeping a deeper fluid through the wound on the left medial leg compatible with some degree of lymphedema 12/04/16; the patient's wound is fully epithelialized but I don't think fully healed. We will do another week of depression with Promogran and TCA however I suspect we'll be able to discharge her next week. This is a very unusual-looking wound which was initially a figure-of-eight type wound lying on its side surrounded by petechial like hemorrhage. She has had venous ablation on this side. She apparently does not have an arterial issue per Dr. Kellie Simmering. She saw her dermatologist thought it was "vascular". Patient is definitely going to need ongoing compression and I talked about this with her today she will go to elastic therapy after she leaves here next week 12/11/16; the patient's wound is not completely closed today. She has surrounding scar tissue and in further discussion with the patient it would appear that she had ulcers in this area in 2009 for a prolonged period of time ultimately requiring a punch biopsy of this area that only showed venous insufficiency. I did not previously pickup on this part of the history from the patient. 12/18/16; the patient's  wound is completely epithelialized. There is no open area here. She has significant bilateral venous insufficiency with secondary lymphedema to a mild-to-moderate degree she does not have compression stockings.. She did not say anything to me when I was in the room, she told our intake nurse that she was still having pain in this area. This isn't unusual recurrent small open area. She is going to go to elastic therapy to obtain compression stockings. 12/25/16; the  patient's wound is fully epithelialized. There is no open area here. The patient describes some continued episodic discomfort in this area medial left calf. However everything looks fine and healed here. She is been to elastic therapy and [name], [name] ([id number]) caught herself 15-20 mmHg stockings, they apparently were having trouble getting 20-30 mm stockings in her size 01/22/17; this is a patient we discharged from the clinic a month ago. She has a recurrent open wound on her medial left calf. She had 15 mm support stockings. I told her I thought she needed 20-30 mm compression stockings. She tells me that she has been ill with hospitalization secondary to asthma and is been found to have severe hypokalemia likely secondary to a combination of Lasix and metolazone. This morning she noted blistering and leaking fluid on the posterior part of her left leg. She called our intake nurse urgently and we was saw her this afternoon. She has not had any real discomfort here. I don't know that she's been wearing any stockings on this leg for at least 2-3 days. ABIs in this clinic were 1.21 on the right and 1.3 on the left. She is previously seen vascular surgery who does not think that there is a peripheral arterial issue. 01/30/17; Patient arrives with no open wound on the left leg. She has been to elastic therapy and obtained 20-12mmhg below knee stockings and she has one on the right leg today. READMISSION 02/19/18; this Fadely is a now  73 year old patient we've had in this clinic perhaps 3 times before. I had last looked at her from January 07 December 2016 with an area on the medial left leg. We discharged her on 12/25/16 however she had to be readmitted on 01/22/17 with a recurrence. I have in my notes that we discharged her on 20-30 mm stockings although she tells me she was only wearing support hose because she cannot get stockings on predominantly related to her cervical spine surgery/issues. She has had previous ablations done by vein and vascular in [place] including a great saphenous vein ablation on the left with an anterior accessory branch ablation I think both of these were in 2016. On one of the previous visit she had a biopsy noted 2009 that was negative. She is not felt to have an arterial issue. She is not a diabetic. She does have a history of obstructive sleep apnea hypertension asthma as well as chronic venous insufficiency and lymphedema. On this occasion she noted 2 dry scaly patch on her left leg. She tried to put lotion on this it didn't really help. There were 2 open areas.the patient has been seeing her primary physician from 02/05/18 through 02/14/18. She had Unna boots applied. The superior wound now on the lateral left leg has closed but she's had one wound that remains open on the lateral left leg. This is not the same spot as we dealt with in 2018. ABIs in this clinic were 1.3 bilaterally 02/26/18; patient has a small wound on the left lateral calf. Dimensions are down. She has chronic venous insufficiency and lymphedema. 03/05/18; small open area on the left lateral calf. Dimensions are down. Tightly adherent necrotic debris over the surface of the wound which was difficult to remove. Also the dressing [over collagen] stuck to the wound surface. This was removed with some difficulty as well. Change the primary dressing to Hydrofera Blue ready 03/12/18; small open area on the left lateral calf. Comes in with  tightly adherent surface eschar as  well as some adherent Hydrofera Blue. 03/19/18; open area on the left lateral calf. Again adherent surface eschar as well as some adherent Hydrofera Blue nonviable subcutaneous tissue. She complained of pain all week even with the reduction from 4-3 layer compression I put on last week. Also she had an increase in her ankle and calf measurements probably related to the same thing. 03/26/18; open area on the left lateral calf. A very small open area remains here. We used silver alginate starting last week as the Hydrofera Blue seem to stick to the wound bed. In using 4-layer compression 04/02/18; the open area in the left lateral calf at some adherent slough which I removed there is no open area here. We are able to transition her into her own compression stocking. Truthfully I think this is probably his support hose. However this does not maintain skin integrity will be limited. She cannot put over the toe compression stockings on because of neck problems hand problems etc. She is allergic to the lining layer of juxta lites. We might be forced to use extremitease stocking should this fail READMIT 11/24/2018 Patient is now a 73 year old woman who is not a diabetic. She has been in this clinic on at least 3 previous occasions largely with recurrent wounds on her left leg secondary to chronic venous insufficiency with secondary lymphedema. Her situation is complicated by inability to get stockings on and an allergy to neoprene which is apparently a component and at least juxta lites and other stockings. As a result she really has not been wearing any stockings on her legs. She tells [place] that roughly 2 or 3 weeks ago she started noticing a stinging sensation just above her ankle on the left medial aspect. She has been diagnosed with pseudogout and she wondered whether this was what she was experiencing. She tried to dress this with something she bought at the store  however subsequently it pulled skin off and now she has an open wound that is not improving. She has been using Vaseline gauze with a cover bandage. She saw her primary doctor last week who put an Haematologist on her. ABIs in this clinic was 1.03 on the left [name], [name]. ([id number]) 2/12; the area is on the left medial ankle. Odd-looking wound with what looks to be surface epithelialization but a multitude of small petechial openings. This clearly not closed yet. We have been using silver alginate under 3 layer compression with TCA 2/19; the wound area did not look quite as good this week. Necrotic debris over the majority of the wound surface which required debridement. She continues to have a multitude of what looked to be small petechial openings. She reminds [place] that she had a biopsy on this initially during her first outbreak in 2015 in [facility]. She expresses concern about this being a possible melanoma. 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 I assured her that this does not look anything like melanoma. Except for the petechial reaction it does look like a venous insufficiency area and she certainly has evidence of this on both sides 2/26; a difficult area on the left medial ankle. The patient clearly has chronic venous hypertension with some degree of lymphedema. The odd thing about the area is the small petechial hemorrhages. I am not really sure how to explain this. This was present last time and this is not a compression injury. We have  been using Hydrofera Blue which I changed to last week 3/4; still using Hydrofera Blue. Aggressive debridement today. She does not have known arterial issues. She has seen Dr. Kellie Simmering at [facility] and and has an ablation on the left. [Anterior accessory branch of the greater saphenous]. From what I remember they did not feel she had  an arterial issue. The patient has had this area biopsied in 2009 at [facility] dermatology and by her recollection they said this was "stasis". She is also follow-up with dermatology locally who thought that this was more of a vascular issue 3/11; using Hydrofera Blue. Aggressive debridement today. She does not have an arterial issue. We are using 3 layer compression although we may need to go to 4. The patient has been in for multiple changes to her wrap since I last saw her a week ago. She says that the area was leaking. I do not have too much more information on what was found 01/19/19 on evaluation today patient was actually being seen for a nurse visit when unfortunately she had the area on her left lateral lower extremity as well as weeping from the right lower extremity that became apparent. Therefore we did end up actually seeing her for a full visit with myself. She is having some pain at this site as well but fortunately nothing too significant at this point. No fevers, chills, nausea, or vomiting noted at this time. 3/18-Patient is back to the clinic with the left leg venous leg ulcer, the ulcer is larger in size, has a surface that is densely adherent with fibrinous tissue, the Hydrofera Blue was used but is densely adherent and there was difficulty in removing it. The right lower extremity was also wrapped for weeping edema. Patient has a new area over the left lateral foot above the malleolus that is small and appears to have no debris with intact surrounding skin. Patient is on increased dose of Lasix also as a means to edema management 3/25; the patient has a nonhealing venous ulcer on the medial left leg and last week developed a smaller area on the lateral left calf. We have been using Hydrofera Blue with a contact layer. 4/1; no major change in these wounds areas. Left medial and more recently left lateral calf. I tried Iodoflex last week to aid in debridement she did not tolerate  this. She stated her pain was terrible all week. She took the top layer of the 4 layer compression off. 4/8; the patient actually looks somewhat better in terms of her more prominent left lateral calf wound. There is some healthy looking tissue here. She is still complaining of a lot of discomfort. 4/15; patient in a lot of pain secondary to sciatica. She is on a prednisone taper prescribed by her primary physician. She has the 2 areas one on the left medial and more recently a smaller area on the left lateral calf. Both of these just above the malleoli 4/22; her back pain is better but she still states she is very uncomfortable and now feels she is intolerant to the [employer]. No real change in the wounds we have been using Sorbact. She has been previously intolerant to Iodoflex. There is not a lot of option about what we can use to debride this wound under compression that she no doubt needs. sHe states Ultram no longer works for her pain 4/29; no major change in the wounds slightly increased depth. Surface on the original medial wound perhaps  somewhat improved however the more recent area on the lateral left ankle is 100% covered in very adherent debris we have been using Sorbact. She tolerates 4 layer compression well and her edema control is a lot better. She has not had to come in for a nurse check Objective [name], [name] ([id number]) Constitutional Sitting or standing Blood Pressure is within target range for patient.. Pulse regular and within target range for patient.Marland Kitchen Respirations regular, non-labored and within target range.. Temperature is normal and within the target range for the patient.Marland Kitchen appears in no distress. Vitals Time Taken: 11:00 AM, Height: 63 in, Weight: 224.7 lbs, BMI: 39.8, Temperature: 98.4 F, Pulse: 73 bpm, Respiratory Rate: 16 breaths/min, Blood Pressure: 136/75 mmHg. Cardiovascular Pedal pulses are palpable on the left. General Notes: Wound exam; left medial  calf and left lateral calf. No debridement was allowed by the patient in either area although both require it. In fact we can barely get her to tolerate washing the wound surface. The medial area has some depth with tightly adherent debris at the bottom. The lateral wound which was more recent is 100% covered and tightly adherent debris. There is no surrounding erythema no evidence of infection. Integumentary (Hair, Skin) Wound #5 status is Open. Original cause of wound was Gradually Appeared. The wound is located on the Left,Medial Lower Leg. The wound measures 2.6cm length x 2.5cm width x 0.4cm depth; 5.105cm^2 area and 2.042cm^3 volume. There is Fat Layer (Subcutaneous Tissue) Exposed exposed. There is no tunneling or undermining noted. There is a medium amount of serosanguineous drainage noted. The wound margin is indistinct and nonvisible. There is small (1-33%) red, pink granulation within the wound bed. There is a large (67-100%) amount of necrotic tissue within the wound bed including Adherent Slough. The periwound skin appearance exhibited: Maceration, Hemosiderin Staining. The periwound skin appearance did not exhibit: Callus, Crepitus, Excoriation, Induration, Rash, Scarring, Dry/Scaly, Atrophie Blanche, Cyanosis, Ecchymosis, Mottled, Pallor, Rubor, Erythema. Periwound temperature was noted as No Abnormality. The periwound has tenderness on palpation. Wound #6 status is Open. Original cause of wound was Gradually Appeared. The wound is located on the Left,Lateral Lower Leg. The wound measures 1.7cm length x 2.1cm width x 0.3cm depth; 2.804cm^2 area and 0.841cm^3 volume. There is Fat Layer (Subcutaneous Tissue) Exposed exposed. There is no tunneling or undermining noted. There is a medium amount of serous drainage noted. The wound margin is flat and intact. There is no granulation within the wound bed. There is a large (67-100%) amount of necrotic tissue within the wound bed including  Adherent Slough. The periwound skin appearance exhibited: Hemosiderin Staining. The periwound skin appearance did not exhibit: Callus, Crepitus, Excoriation, Induration, Rash, Scarring, Dry/Scaly, Maceration, Atrophie Blanche, Cyanosis, Ecchymosis, Mottled, Pallor, Rubor, Erythema. Periwound temperature was noted as No Abnormality. The periwound has tenderness on palpation. Assessment Active Problems ICD-10 Non-pressure chronic ulcer of left calf limited to breakdown of skin Chronic venous hypertension (idiopathic) with inflammation of right lower extremity Lymphedema, not elsewhere classified Plan Wound Cleansing: [name], [name]. ([id number]) Wound #5 Left,Medial Lower Leg: Clean wound with Normal Saline. May Shower, gently pat wound dry prior to applying new dressing. Wound #6 Left,Lateral Lower Leg: Clean wound with Normal Saline. May Shower, gently pat wound dry prior to applying new dressing. Anesthetic (add to Medication List): Wound #5 Left,Medial Lower Leg: Topical Lidocaine 4% cream applied to wound bed prior to debridement (In Clinic Only). Wound #6 Left,Lateral Lower Leg: Topical Lidocaine 4% cream applied to wound bed prior to  debridement (In Clinic Only). Skin Barriers/Peri-Wound Care: Wound #5 Left,Medial Lower Leg: Triamcinolone Acetonide Ointment (TCA) - on wound and peri-wound Wound #6 Left,Lateral Lower Leg: Triamcinolone Acetonide Ointment (TCA) - on wound and peri-wound Primary Wound Dressing: Wound #5 Left,Medial Lower Leg: Other: - Sorbact Wound #6 Left,Lateral Lower Leg: Other: - Sorbact Secondary Dressing: Wound #5 Left,Medial Lower Leg: ABD pad Wound #6 Left,Lateral Lower Leg: ABD pad Dressing Change Frequency: Wound #5 Left,Medial Lower Leg: Change dressing every week Wound #6 Left,Lateral Lower Leg: Change dressing every week Follow-up Appointments: Wound #5 Left,Medial Lower Leg: Return Appointment in 1 week. Nurse Visit as needed Wound #6  Left,Lateral Lower Leg: Return Appointment in 1 week. Nurse Visit as needed Edema Control: Wound #5 Left,Medial Lower Leg: 4-Layer Compression System - Left Lower Extremity. Wound #6 Left,Lateral Lower Leg: 4-Layer Compression System - Left Lower Extremity. Off-Loading: Wound #5 Left,Medial Lower Leg: Other: - Elevate legs as needed Other: - Elevate legs as needed Wound #6 Left,Lateral Lower Leg: Other: - Elevate legs as needed Other: - Elevate legs as needed Medications-please add to medication list.: Wound #5 Left,Medial Lower Leg: Other: - Tramadol Wound #6 Left,Lateral Lower Leg: Other: - Tramadol [name], [name] ([id number]) #1 the patient has limitations on what we can do. I did bring up the idea of using Santyl and allowing her to change compression stockings although she tells me between pain in her hip and her back she would not be able to get these on. 2. She would not allow debridement today's in fact we could barely even wash the wound surfaces off. I may need to try to inject these next time i.e. injectable lidocaine 3. She did not tolerate Iodoflex Electronic Signature(s) Signed: 03/05/2019 7:17:18 AM By: Linton Ham MD Entered By: Linton Ham on 03/04/2019 11:45:22 Guimaraes, [name] ([id number]) -------------------------------------------------------------------------------- SuperBill Details Patient Name: ROYA, GIESELMAN. Date of Service: 03/04/2019 Medical Record Number: [id number] Patient Account Number: [id number] Date of Birth/Sex: [birth year] (73 y.o. F) Treating RN: Harold Barban Primary Care Provider: [name] Other Clinician: Referring Provider: [name] Treating Provider/Extender: Tito Dine in Treatment: 12 Diagnosis Coding ICD-10 Codes Code Description [phone] Non-pressure chronic ulcer of left calf limited to breakdown of skin I87.321 Chronic venous hypertension (idiopathic) with inflammation of right lower  extremity I89.0 Lymphedema, not elsewhere classified Facility Procedures CPT4 Code: [id number] Description: 99213 - WOUND CARE VISIT-LEV 3 EST PT Modifier: Quantity: 1 Physician Procedures CPT4 Code Description: [id number] 99213 - WC PHYS LEVEL 3 - EST PT ICD-10 Diagnosis Description L97.221 Non-pressure chronic ulcer of left calf limited to breakdown I87.321 Chronic venous hypertension (idiopathic) with inflammation of I89.0 Lymphedema, not  elsewhere classified Modifier: of skin right lower extr Quantity: 1 emity Electronic Signature(s) Signed: 03/05/2019 7:17:18 AM By: Linton Ham MD Entered By: Linton Ham on 03/04/2019 11:45:47

## 2019-03-05 NOTE — Progress Notes (Signed)
LETA, BUCKLIN ([id number]) Visit Report for 03/04/2019 Arrival Information Details Patient Name: [name], [name]. Date of Service: 03/04/2019 11:15 AM Medical Record Number: [id number] Patient Account Number: [id number] Date of Birth/Sex: 1946-01-18 (72 y.o. F) Treating RN: Harold Barban Primary Care Robbye Dede: [name] Other Clinician: Referring Beautiful Pensyl: [name] Treating Karron Alvizo/Extender: Tito Dine in Treatment: 12 Visit Information History Since Last Visit Added or deleted any medications: No Patient Arrived: Ambulatory Any new allergies or adverse reactions: No Arrival Time: 10:58 Had a fall or experienced change in No Accompanied By: self activities of daily living that may affect Transfer Assistance: None risk of falls: Patient Identification Verified: Yes Signs or symptoms of abuse/neglect since last visito No Secondary Verification Process Completed: Yes Hospitalized since last visit: No Implantable device outside of the clinic excluding No cellular tissue based products placed in the center since last visit: Has Dressing in Place as Prescribed: Yes Pain Present Now: No Electronic Signature(s) Signed: 03/04/2019 2:30:22 PM By: Lorine Bears RCP, RRT, CHT Entered By: Lorine Bears on 03/04/2019 10:59:47 Stream, [name] ([id number]) -------------------------------------------------------------------------------- Clinic Level of Care Assessment Details Patient Name: [name], [name]. Date of Service: 03/04/2019 11:15 AM Medical Record Number: [id number] Patient Account Number: [id number] Date of Birth/Sex: July 23, 1946 (72 y.o. F) Treating RN: Harold Barban Primary Care Jossilyn Benda: [name] Other Clinician: Referring Eleni Frank: [name] Treating Baldemar Dady/Extender: Tito Dine in Treatment: 12 Clinic Level of Care Assessment Items TOOL 4 Quantity Score []  - Use when only an EandM is  performed on FOLLOW-UP visit 0 ASSESSMENTS - Nursing Assessment / Reassessment X - Reassessment of Co-morbidities (includes updates in patient status) 1 10 X- 1 5 Reassessment of Adherence to Treatment Plan ASSESSMENTS - Wound and Skin Assessment / Reassessment []  - Simple Wound Assessment / Reassessment - one wound 0 X- 2 5 Complex Wound Assessment / Reassessment - multiple wounds []  - 0 Dermatologic / Skin Assessment (not related to wound area) ASSESSMENTS - Focused Assessment []  - Circumferential Edema Measurements - multi extremities 0 []  - 0 Nutritional Assessment / Counseling / Intervention []  - 0 Lower Extremity Assessment (monofilament, tuning fork, pulses) []  - 0 Peripheral Arterial Disease Assessment (using hand held doppler) ASSESSMENTS - Ostomy and/or Continence Assessment and Care []  - Incontinence Assessment and Management 0 []  - 0 Ostomy Care Assessment and Management (repouching, etc.) PROCESS - Coordination of Care X - Simple Patient / Family Education for ongoing care 1 15 []  - 0 Complex (extensive) Patient / Family Education for ongoing care []  - 0 Staff obtains Programmer, systems, Records, Test Results / Process Orders []  - 0 Staff telephones HHA, Nursing Homes / Clarify orders / etc []  - 0 Routine Transfer to another Facility (non-emergent condition) []  - 0 Routine Hospital Admission (non-emergent condition) []  - 0 New Admissions / Biomedical engineer / Ordering NPWT, Apligraf, etc. []  - 0 Emergency Hospital Admission (emergent condition) X- 1 10 Simple Discharge Coordination [name], [name] ([id number]) []  - 0 Complex (extensive) Discharge Coordination PROCESS - Special Needs []  - Pediatric / Minor Patient Management 0 []  - 0 Isolation Patient Management []  - 0 Hearing / Language / Visual special needs []  - 0 Assessment of Community assistance (transportation, D/C planning, etc.) []  - 0 Additional assistance / Altered mentation []  - 0 Support  Surface(s) Assessment (bed, cushion, seat, etc.) INTERVENTIONS - Wound Cleansing / Measurement []  - Simple Wound Cleansing - one wound 0 X- 2 5 Complex Wound Cleansing - multiple wounds X- 1  5 Wound Imaging (photographs - any number of wounds) []  - 0 Wound Tracing (instead of photographs) []  - 0 Simple Wound Measurement - one wound X- 2 5 Complex Wound Measurement - multiple wounds INTERVENTIONS - Wound Dressings X - Small Wound Dressing one or multiple wounds 2 10 []  - 0 Medium Wound Dressing one or multiple wounds []  - 0 Large Wound Dressing one or multiple wounds []  - 0 Application of Medications - topical []  - 0 Application of Medications - injection INTERVENTIONS - Miscellaneous []  - External ear exam 0 []  - 0 Specimen Collection (cultures, biopsies, blood, body fluids, etc.) []  - 0 Specimen(s) / Culture(s) sent or taken to Lab for analysis []  - 0 Patient Transfer (multiple staff / Civil Service fast streamer / Similar devices) []  - 0 Simple Staple / Suture removal (25 or less) []  - 0 Complex Staple / Suture removal (26 or more) []  - 0 Hypo / Hyperglycemic Management (close monitor of Blood Glucose) []  - 0 Ankle / Brachial Index (ABI) - do not check if billed separately X- 1 5 Vital Signs [name], [name] ([id number]) Has the patient been seen at the hospital within the last three years: Yes Total Score: 100 Level Of Care: New/Established - Level 3 Electronic Signature(s) Signed: 03/04/2019 4:45:14 PM By: Harold Barban Entered By: Harold Barban on 03/04/2019 11:26:45 Hapke, [name] ([id number]) -------------------------------------------------------------------------------- Encounter Discharge Information Details Patient Name: [name], [name]. Date of Service: 03/04/2019 11:15 AM Medical Record Number: [id number] Patient Account Number: [id number] Date of Birth/Sex: 09/27/1946 (72 y.o. F) Treating RN: Harold Barban Primary Care Laekyn Rayos: [name] Other  Clinician: Referring Mintie Witherington: [name] Treating Vyron Fronczak/Extender: Tito Dine in Treatment: 12 Encounter Discharge Information Items Discharge Condition: Stable Ambulatory Status: Ambulatory Discharge Destination: Home Transportation: Private Auto Accompanied By: self Schedule Follow-up Appointment: Yes Clinical Summary of Care: Electronic Signature(s) Signed: 03/04/2019 4:45:14 PM By: Harold Barban Entered By: Harold Barban on 03/04/2019 11:36:17 Pettway, [name] ([id number]) -------------------------------------------------------------------------------- Lower Extremity Assessment Details Patient Name: [name], [name]. Date of Service: 03/04/2019 11:15 AM Medical Record Number: [id number] Patient Account Number: [id number] Date of Birth/Sex: 1946-10-12 (72 y.o. F) Treating RN: Montey Hora Primary Care Cheyne Boulden: [name] Other Clinician: Referring Ellyanna [name]: [name] Treating Victoire Deans/Extender: Tito Dine in Treatment: 12 Edema Assessment Assessed: [Left: No] [Right: No] Edema: [Left: Ye] [Right: s] Calf Left: Right: Point of Measurement: 31 cm From Medial Instep 39 cm cm Ankle Left: Right: Point of Measurement: 13 cm From Medial Instep 22.5 cm cm Vascular Assessment Pulses: Dorsalis Pedis Palpable: [Left:Yes] Electronic Signature(s) Signed: 03/04/2019 4:42:28 PM By: Montey Hora Entered By: Montey Hora on 03/04/2019 11:05:48 Powless, [name] ([id number]) -------------------------------------------------------------------------------- Multi Wound Chart Details Patient Name: [name], [name]. Date of Service: 03/04/2019 11:15 AM Medical Record Number: [id number] Patient Account Number: [id number] Date of Birth/Sex: May 30, 1946 (72 y.o. F) Treating RN: Harold Barban Primary Care Elizabelle Fite: [name] Other Clinician: Referring Tauren Delbuono: [name] Treating Haleema Vanderheyden/Extender: Tito Dine  in Treatment: 12 Vital Signs Height(in): 63 Pulse(bpm): 17 Weight(lbs): 224.7 Blood Pressure(mmHg): 136/75 Body Mass Index(BMI): 40 Temperature(F): 98.4 Respiratory Rate 16 (breaths/min): Photos: [N/A:N/A] Wound Location: Left Lower Leg - Medial Left Lower Leg - Lateral N/A Wounding Event: Gradually Appeared Gradually Appeared N/A Primary Etiology: Lymphedema Venous Leg Ulcer N/A Comorbid History: Cataracts, Asthma, Sleep Cataracts, Asthma, Sleep N/A Apnea, Deep Vein Apnea, Deep Vein Thrombosis, Hypertension, Thrombosis, Hypertension, Peripheral Venous Disease, Peripheral Venous Disease, Osteoarthritis, Received Osteoarthritis, Received Chemotherapy, Received Chemotherapy, Received Radiation Radiation  Date Acquired: 11/19/2018 01/19/2019 N/A Weeks of Treatment: 12 6 N/A Wound Status: Open Open N/A Measurements L x W x D 2.6x2.5x0.4 1.7x2.1x0.3 N/A (cm) Area (cm) : 5.105 2.804 N/A Volume (cm) : 2.042 0.841 N/A % Reduction in Area: 40.30% -1174.50% N/A % Reduction in Volume: -138.80% -3722.70% N/A Classification: Full Thickness Without Full Thickness Without N/A Exposed Support Structures Exposed Support Structures Exudate Amount: Medium Medium N/A Exudate Type: Serosanguineous Serous N/A Exudate Color: red, brown amber N/A Wound Margin: Indistinct, nonvisible Flat and Intact N/A Granulation Amount: Small (1-33%) None Present (0%) N/A Granulation Quality: Red, Pink N/A N/A Necrotic Amount: Large (67-100%) Large (67-100%) N/A Exposed Structures: N/A SHAKEERA, RIGHTMYER ([id number]) Fat Layer (Subcutaneous Fat Layer (Subcutaneous Tissue) Exposed: Yes Tissue) Exposed: Yes Fascia: No Fascia: No Tendon: No Tendon: No Muscle: No Muscle: No Joint: No Joint: No Bone: No Bone: No Epithelialization: None None N/A Periwound Skin Texture: Excoriation: No Excoriation: No N/A Induration: No Induration: No Callus: No Callus: No Crepitus: No Crepitus: No Rash: No Rash:  No Scarring: No Scarring: No Periwound Skin Moisture: Maceration: Yes Maceration: No N/A Dry/Scaly: No Dry/Scaly: No Periwound Skin Color: Hemosiderin Staining: Yes Hemosiderin Staining: Yes N/A Atrophie Blanche: No Atrophie Blanche: No Cyanosis: No Cyanosis: No Ecchymosis: No Ecchymosis: No Erythema: No Erythema: No Mottled: No Mottled: No Pallor: No Pallor: No Rubor: No Rubor: No Temperature: No Abnormality No Abnormality N/A Tenderness on Palpation: Yes Yes N/A Treatment Notes Electronic Signature(s) Signed: 03/05/2019 7:17:18 AM By: Linton Ham MD Entered By: Linton Ham on 03/04/2019 11:34:29 Alred, [name] ([id number]) -------------------------------------------------------------------------------- Multi-Disciplinary Care Plan Details Patient Name: [name], [name]. Date of Service: 03/04/2019 11:15 AM Medical Record Number: [id number] Patient Account Number: [id number] Date of Birth/Sex: [birth year] (72 y.o. F) Treating RN: Harold Barban Primary Care Lanaiya Lantry: [name] Other Clinician: Referring Palak Tercero: [name] Treating Taquita Demby/Extender: Tito Dine in Treatment: 12 Active Inactive Orientation to the Wound Care Program Nursing Diagnoses: Knowledge deficit related to the wound healing center program Goals: Patient/caregiver will verbalize understanding of the [facility] Program Date Initiated: 12/10/2018 Target Resolution Date: 01/09/2019 Goal Status: Active Interventions: Provide education on orientation to the wound center Notes: Soft Tissue Infection Nursing Diagnoses: Impaired tissue integrity Goals: Patient's soft tissue infection will resolve Date Initiated: 12/10/2018 Target Resolution Date: 01/09/2019 Goal Status: Active Interventions: Assess signs and symptoms of infection every visit Notes: Venous Leg Ulcer Nursing Diagnoses: Actual venous Insuffiency (use after diagnosis is  confirmed) Goals: Patient will maintain optimal edema control Date Initiated: 12/10/2018 Target Resolution Date: 01/09/2019 Goal Status: Active Interventions: Assess peripheral edema status every visit. [name], [name] ([id number]) Treatment Activities: Therapeutic compression applied : 12/10/2018 Notes: Wound/Skin Impairment Nursing Diagnoses: Impaired tissue integrity Goals: Patient/caregiver will verbalize understanding of skin care regimen Date Initiated: 12/10/2018 Target Resolution Date: 01/09/2019 Goal Status: Active Interventions: Assess ulceration(s) every visit Treatment Activities: Topical wound management initiated : 12/10/2018 Notes: Electronic Signature(s) Signed: 03/04/2019 4:45:14 PM By: Harold Barban Entered By: Harold Barban on 03/04/2019 11:22:19 Vanhorn, [name] ([id number]) -------------------------------------------------------------------------------- Pain Assessment Details Patient Name: [name], [name]. Date of Service: 03/04/2019 11:15 AM Medical Record Number: [id number] Patient Account Number: [id number] Date of Birth/Sex: 25-Dec-1945 (72 y.o. F) Treating RN: Harold Barban Primary Care Leala Bryand: [name] Other Clinician: Referring Nyssa Sayegh: [name] Treating Michelene Keniston/Extender: Tito Dine in Treatment: 12 Active Problems Location of Pain Severity and Description of Pain Patient Has Paino No Site Locations Pain Management and Medication Current Pain Management: Electronic Signature(s) Signed:  03/04/2019 2:30:22 PM By: Paulla Fore, RRT, CHT Signed: 03/04/2019 4:45:14 PM By: Harold Barban Entered By: Lorine Bears on 03/04/2019 10:59:56 [name] ([id number]) -------------------------------------------------------------------------------- Patient/Caregiver Education Details Patient Name: [name], [name]. Date of Service: 03/04/2019 11:15 AM Medical Record Number:  [id number] Patient Account Number: [id number] Date of Birth/Gender: 06/13/1946 (73 y.o. F) Treating RN: Harold Barban Primary Care Physician: [name] Other Clinician: Referring Physician: [name] Treating Physician/Extender: Tito Dine in Treatment: 12 Education Assessment Education Provided To: Patient Education Topics Provided Wound/Skin Impairment: Handouts: Caring for Your Ulcer Methods: Demonstration, Explain/Verbal Responses: State content correctly Electronic Signature(s) Signed: 03/04/2019 4:45:14 PM By: Harold Barban Entered By: Harold Barban on 03/04/2019 11:22:45 Berman, [name] ([id number]) -------------------------------------------------------------------------------- Wound Assessment Details Patient Name: SURIYAH, VERGARA. Date of Service: 03/04/2019 11:15 AM Medical Record Number: [id number] Patient Account Number: [id number] Date of Birth/Sex: 11/14/1945 (72 y.o. F) Treating RN: Montey Hora Primary Care Emelda Kohlbeck: [name] Other Clinician: Referring Rose Hippler: [name] Treating Emmalise Huard/Extender: Tito Dine in Treatment: 12 Wound Status Wound Number: 5 Primary Lymphedema Etiology: Wound Location: Left Lower Leg - Medial Wound Open Wounding Event: Gradually Appeared Status: Date Acquired: 11/19/2018 Comorbid Cataracts, Asthma, Sleep Apnea, Deep Vein Weeks Of Treatment: 12 History: Thrombosis, Hypertension, Peripheral Venous Clustered Wound: No Disease, Osteoarthritis, Received Chemotherapy, Received Radiation Photos Wound Measurements Length: (cm) 2.6 Width: (cm) 2.5 Depth: (cm) 0.4 Area: (cm) 5.105 Volume: (cm) 2.042 % Reduction in Area: 40.3% % Reduction in Volume: -138.8% Epithelialization: None Tunneling: No Undermining: No Wound Description Full Thickness Without Exposed Support Classification: Structures Wound Margin: Indistinct,  nonvisible Exudate Medium Amount: Exudate Type: Serosanguineous Exudate Color: red, brown Foul Odor After Cleansing: No Slough/Fibrino Yes Wound Bed Granulation Amount: Small (1-33%) Exposed Structure Granulation Quality: Red, Pink Fascia Exposed: No Necrotic Amount: Large (67-100%) Fat Layer (Subcutaneous Tissue) Exposed: Yes Necrotic Quality: Adherent Slough Tendon Exposed: No Muscle Exposed: No Joint Exposed: No Bone Exposed: No Dolin, Lamica J. ([id number]) Periwound Skin Texture Texture Color No Abnormalities Noted: No No Abnormalities Noted: No Callus: No Atrophie Blanche: No Crepitus: No Cyanosis: No Excoriation: No Ecchymosis: No Induration: No Erythema: No Rash: No Hemosiderin Staining: Yes Scarring: No Mottled: No Pallor: No Moisture Rubor: No No Abnormalities Noted: No Dry / Scaly: No Temperature / Pain Maceration: Yes Temperature: No Abnormality Tenderness on Palpation: Yes Treatment Notes Wound #5 (Left, Medial Lower Leg) Notes TCA, Sorbact, ABD, 4-Layer wrap left Electronic Signature(s) Signed: 03/04/2019 4:42:28 PM By: Montey Hora Entered By: Montey Hora on 03/04/2019 11:11:07 Pierre, [name] ([id number]) -------------------------------------------------------------------------------- Wound Assessment Details Patient Name: ROGUE, PAUTLER. Date of Service: 03/04/2019 11:15 AM Medical Record Number: [id number] Patient Account Number: [id number] Date of Birth/Sex: Mar 30, 1946 (72 y.o. F) Treating RN: Montey Hora Primary Care Leone Mobley: [name] Other Clinician: Referring Marico Buckle: [name] Treating Markevious Ehmke/Extender: Tito Dine in Treatment: 12 Wound Status Wound Number: 6 Primary Venous Leg Ulcer Etiology: Wound Location: Left Lower Leg - Lateral Wound Open Wounding Event: Gradually Appeared Status: Date Acquired: 01/19/2019 Comorbid Cataracts, Asthma, Sleep Apnea, Deep Vein Weeks Of Treatment:  6 History: Thrombosis, Hypertension, Peripheral Venous Clustered Wound: No Disease, Osteoarthritis, Received Chemotherapy, Received Radiation Photos Wound Measurements Length: (cm) 1.7 Width: (cm) 2.1 Depth: (cm) 0.3 Area: (cm) 2.804 Volume: (cm) 0.841 % Reduction in Area: -1174.5% % Reduction in Volume: -3722.7% Epithelialization: None Tunneling: No Undermining: No Wound Description Full Thickness Without Exposed Support Foul O Classification: Structures Slough Wound Margin: Flat and Intact Exudate Medium  Amount: Exudate Type: Serous Exudate Color: amber dor After Cleansing: No /Fibrino Yes Wound Bed Granulation Amount: None Present (0%) Exposed Structure Necrotic Amount: Large (67-100%) Fascia Exposed: No Necrotic Quality: Adherent Slough Fat Layer (Subcutaneous Tissue) Exposed: Yes Tendon Exposed: No Muscle Exposed: No Joint Exposed: No Bone Exposed: No [place], Kristalynn J. ([id number]) Periwound Skin Texture Texture Color No Abnormalities Noted: No No Abnormalities Noted: No Callus: No Atrophie Blanche: No Crepitus: No Cyanosis: No Excoriation: No Ecchymosis: No Induration: No Erythema: No Rash: No Hemosiderin Staining: Yes Scarring: No Mottled: No Pallor: No Moisture Rubor: No No Abnormalities Noted: No Dry / Scaly: No Temperature / Pain Maceration: No Temperature: No Abnormality Tenderness on Palpation: Yes Treatment Notes Wound #6 (Left, Lateral Lower Leg) Notes TCA, Sorbact, ABD, 4-Layer wrap left Electronic Signature(s) Signed: 03/04/2019 4:42:28 PM By: Montey Hora Entered By: Montey Hora on 03/04/2019 11:11:35 Schrupp, [name] ([id number]) -------------------------------------------------------------------------------- Vitals Details Patient Name: ROSMERY, DUGGIN. Date of Service: 03/04/2019 11:15 AM Medical Record Number: [id number] Patient Account Number: [id number] Date of Birth/Sex: 03-17-1946 (72 y.o. F) Treating RN: Harold Barban Primary Care Girtha Kilgore: [name] Other Clinician: Referring Javonnie Illescas: [name] Treating Gerard Cantara/Extender: Tito Dine in Treatment: 12 Vital Signs Time Taken: 11:00 Temperature (F): 98.4 Height (in): 63 Pulse (bpm): 73 Weight (lbs): 224.7 Respiratory Rate (breaths/min): 16 Body Mass Index (BMI): 39.8 Blood Pressure (mmHg): 136/75 Reference Range: 80 - 120 mg / dl Electronic Signature(s) Signed: 03/04/2019 2:30:22 PM By: Lorine Bears RCP, RRT, CHT Entered By: Lorine Bears on 03/04/2019 11:03:04

## 2019-03-06 ENCOUNTER — Other Ambulatory Visit: Payer: Self-pay

## 2019-03-06 ENCOUNTER — Encounter: Payer: Medicare Other | Attending: Physician Assistant

## 2019-03-06 DIAGNOSIS — I87332 Chronic venous hypertension (idiopathic) with ulcer and inflammation of left lower extremity: Secondary | ICD-10-CM | POA: Insufficient documentation

## 2019-03-06 DIAGNOSIS — L97221 Non-pressure chronic ulcer of left calf limited to breakdown of skin: Secondary | ICD-10-CM | POA: Diagnosis not present

## 2019-03-06 DIAGNOSIS — I890 Lymphedema, not elsewhere classified: Secondary | ICD-10-CM | POA: Diagnosis not present

## 2019-03-11 ENCOUNTER — Other Ambulatory Visit: Payer: Self-pay

## 2019-03-11 ENCOUNTER — Encounter: Payer: Medicare Other | Admitting: Internal Medicine

## 2019-03-11 DIAGNOSIS — L97222 Non-pressure chronic ulcer of left calf with fat layer exposed: Secondary | ICD-10-CM | POA: Diagnosis not present

## 2019-03-11 DIAGNOSIS — I87332 Chronic venous hypertension (idiopathic) with ulcer and inflammation of left lower extremity: Secondary | ICD-10-CM | POA: Diagnosis not present

## 2019-03-11 DIAGNOSIS — I890 Lymphedema, not elsewhere classified: Secondary | ICD-10-CM | POA: Diagnosis not present

## 2019-03-11 DIAGNOSIS — I87312 Chronic venous hypertension (idiopathic) with ulcer of left lower extremity: Secondary | ICD-10-CM | POA: Diagnosis not present

## 2019-03-11 DIAGNOSIS — L97221 Non-pressure chronic ulcer of left calf limited to breakdown of skin: Secondary | ICD-10-CM | POA: Diagnosis not present

## 2019-03-11 DIAGNOSIS — G5603 Carpal tunnel syndrome, bilateral upper limbs: Secondary | ICD-10-CM | POA: Diagnosis not present

## 2019-03-12 NOTE — Progress Notes (Signed)
VANNIA, POLA ([id number]) Visit Report for 03/06/2019 Arrival Information Details Patient Name: [name], [name]. Date of Service: 03/06/2019 12:30 PM Medical Record Number: [id number] Patient Account Number: [id number] Date of Birth/Sex: [birth year] (73 y.o. F) Treating RN: [name] Primary Care [name]: [name] Other Clinician: Referring [name]: [name] Treating [name]/Extender: [name], [name] in Treatment: 12 Visit Information History Since Last Visit Added or deleted any medications: No Patient Arrived: Ambulatory Any new allergies or adverse reactions: No Arrival Time: 12:37 Had a fall or experienced change in No Accompanied By: self activities of daily living that may affect Transfer Assistance: None risk of falls: Patient Identification Verified: Yes Signs or symptoms of abuse/neglect since last visito No Secondary Verification Process Completed: Yes Hospitalized since last visit: No Has Dressing in Place as Prescribed: Yes Has Compression in Place as Prescribed: Yes Pain Present Now: Yes Electronic Signature(s) Signed: 03/12/2019 8:05:03 AM By: [name] Entered By: [name] on 03/06/2019 12:39:03 Wawrzyniak, [name] ([id number]) -------------------------------------------------------------------------------- Clinic Level of Care Assessment Details Patient Name: [name], [name]. Date of Service: 03/06/2019 12:30 PM Medical Record Number: [id number] Patient Account Number: [id number] Date of Birth/Sex: [birth year] (73 y.o. F) Treating RN: [name] Primary Care [name]: [name] Other Clinician: Referring [name]: [name] Treating Jeydi Klingel/Extender: [name], [name] in Treatment: 12 Clinic Level of Care Assessment Items TOOL 4 Quantity Score []  - Use when only an EandM is performed on FOLLOW-UP visit 0 ASSESSMENTS - Nursing Assessment / Reassessment X - Reassessment of Co-morbidities (includes  updates in patient status) 1 10 X- 1 5 Reassessment of Adherence to Treatment Plan ASSESSMENTS - Wound and Skin Assessment / Reassessment []  - Simple Wound Assessment / Reassessment - one wound 0 X- 2 5 Complex Wound Assessment / Reassessment - multiple wounds []  - 0 Dermatologic / Skin Assessment (not related to wound area) ASSESSMENTS - Focused Assessment []  - Circumferential Edema Measurements - multi extremities 0 []  - 0 Nutritional Assessment / Counseling / Intervention []  - 0 Lower Extremity Assessment (monofilament, tuning fork, pulses) []  - 0 Peripheral Arterial Disease Assessment (using hand held doppler) ASSESSMENTS - Ostomy and/or Continence Assessment and Care []  - Incontinence Assessment and Management 0 []  - 0 Ostomy Care Assessment and Management (repouching, etc.) PROCESS - Coordination of Care X - Simple Patient / Family Education for ongoing care 1 15 []  - 0 Complex (extensive) Patient / Family Education for ongoing care []  - 0 Staff obtains Programmer, systems, Records, Test Results / Process Orders []  - 0 Staff telephones HHA, Nursing Homes / Clarify orders / etc []  - 0 Routine Transfer to another Facility (non-emergent condition) []  - 0 Routine Hospital Admission (non-emergent condition) []  - 0 New Admissions / Biomedical engineer / Ordering NPWT, Apligraf, etc. []  - 0 Emergency Hospital Admission (emergent condition) X- 1 10 Simple Discharge Coordination [name], [name] ([id number]) []  - 0 Complex (extensive) Discharge Coordination PROCESS - Special Needs []  - Pediatric / Minor Patient Management 0 []  - 0 Isolation Patient Management []  - 0 Hearing / Language / Visual special needs []  - 0 Assessment of Community assistance (transportation, D/C planning, etc.) []  - 0 Additional assistance / Altered mentation []  - 0 Support Surface(s) Assessment (bed, cushion, seat, etc.) INTERVENTIONS - Wound Cleansing / Measurement []  - Simple Wound Cleansing -  one wound 0 X- 2 5 Complex Wound Cleansing - multiple wounds []  - 0 Wound Imaging (photographs - any number of wounds) []  - 0 Wound Tracing (instead of photographs) []  -  0 Simple Wound Measurement - one wound X- 2 5 Complex Wound Measurement - multiple wounds INTERVENTIONS - Wound Dressings []  - Small Wound Dressing one or multiple wounds 0 X- 2 15 Medium Wound Dressing one or multiple wounds []  - 0 Large Wound Dressing one or multiple wounds []  - 0 Application of Medications - topical []  - 0 Application of Medications - injection INTERVENTIONS - Miscellaneous []  - External ear exam 0 []  - 0 Specimen Collection (cultures, biopsies, blood, body fluids, etc.) []  - 0 Specimen(s) / Culture(s) sent or taken to Lab for analysis []  - 0 Patient Transfer (multiple staff / Civil Service fast streamer / Similar devices) []  - 0 Simple Staple / Suture removal (25 or less) []  - 0 Complex Staple / Suture removal (26 or more) []  - 0 Hypo / Hyperglycemic Management (close monitor of Blood Glucose) []  - 0 Ankle / Brachial Index (ABI) - do not check if billed separately X- 1 5 Vital Signs [name], [name] ([id number]) Has the patient been seen at the hospital within the last three years: Yes Total Score: 105 Level Of Care: New/Established - Level 3 Electronic Signature(s) Signed: 03/12/2019 8:05:03 AM By: [name] Entered By: [name] on 03/06/2019 13:04:11 [name], [name] ([id number]) -------------------------------------------------------------------------------- Compression Therapy Details Patient Name: [name], [name]. Date of Service: 03/06/2019 12:30 PM Medical Record Number: [id number] Patient Account Number: [id number] Date of Birth/Sex: September 01, 1946 (73 y.o. F) Treating RN: [name] Primary Care Staysha Truby: [name] Other Clinician: Referring Andalyn Heckstall: [name] Treating Brecken Walth/Extender: STONE III, [name] in Treatment: 12 Compression Therapy Performed  for Wound Assessment: Wound #5 Left,Medial Lower Leg Performed By: Clinician Amber Barban, RN Compression Type: Four Layer Electronic Signature(s) Signed: 03/06/2019 2:10:30 PM By: [name] Entered By: [name] on 03/06/2019 14:10:30 [name] ([id number]) -------------------------------------------------------------------------------- Encounter Discharge Information Details Patient Name: [name], [name]. Date of Service: 03/06/2019 12:30 PM Medical Record Number: [id number] Patient Account Number: [id number] Date of Birth/Sex: Feb 07, 1946 (73 y.o. F) Treating RN: [name] Primary Care Sindy Mccune: [name] Other Clinician: Referring Ramey Ketcherside: [name] Treating Alfredo Collymore/Extender: [name], [name] in Treatment: 12 Encounter Discharge Information Items Discharge Condition: Stable Ambulatory Status: Ambulatory Discharge Destination: Home Transportation: Private Auto Accompanied By: self Schedule Follow-up Appointment: Yes Clinical Summary of Care: Electronic Signature(s) Signed: 03/12/2019 8:05:03 AM By: [name] Entered By: [name] on 03/06/2019 13:03:11 Morten, [name] ([id number]) -------------------------------------------------------------------------------- Wound Assessment Details Patient Name: [name], [name]. Date of Service: 03/06/2019 12:30 PM Medical Record Number: [id number] Patient Account Number: [id number] Date of Birth/Sex: 1946-08-29 (73 y.o. F) Treating RN: [name] Primary Care Analina Filla: [name] Other Clinician: Referring Melvin Whiteford: [name] Treating Mariha Sleeper/Extender: STONE III, [name] in Treatment: 12 Wound Status Wound Number: 5 Primary Lymphedema Etiology: Wound Location: Left Lower Leg - Medial Wound Open Wounding Event: Gradually Appeared Status: Date Acquired: 11/19/2018 Comorbid Cataracts, Asthma, Sleep Apnea, Deep Vein [name] Of Treatment: 12 History: Thrombosis,  Hypertension, Peripheral Venous Clustered Wound: No Disease, Osteoarthritis, Received Chemotherapy, Received Radiation Wound Measurements Length: (cm) 2.6 Width: (cm) 2.5 Depth: (cm) 0.4 Area: (cm) 5.105 Volume: (cm) 2.042 % Reduction in Area: 40.3% % Reduction in Volume: -138.8% Epithelialization: None Tunneling: No Undermining: No Wound Description Full Thickness Without Exposed Support Foul Od Classification: Structures Slough/ Wound Margin: Indistinct, nonvisible Exudate Medium Amount: Exudate Type: Serosanguineous Exudate Color: red, brown or After Cleansing: No Fibrino Yes Wound Bed Granulation Amount: Small (1-33%) Exposed Structure Granulation Quality: Red, Pink Fascia Exposed: No Necrotic Amount: Large (  67-100%) Fat Layer (Subcutaneous Tissue) Exposed: Yes Necrotic Quality: Adherent Slough Tendon Exposed: No Muscle Exposed: No Joint Exposed: No Bone Exposed: No Periwound Skin Texture Texture Color No Abnormalities Noted: No No Abnormalities Noted: No Callus: No Atrophie Blanche: No Crepitus: No Cyanosis: No Excoriation: No Ecchymosis: No Induration: No Erythema: No Rash: No Hemosiderin Staining: Yes Scarring: No Mottled: No Pallor: No Moisture Rubor: No No Abnormalities Noted: No [name], DANN. ([id number]) Dry / Scaly: No Temperature / Pain Maceration: Yes Temperature: No Abnormality Tenderness on Palpation: Yes Electronic Signature(s) Signed: 03/12/2019 8:05:03 AM By: [name] Entered By: [name] on 03/06/2019 13:01:17 Garverick, [name] ([id number]) -------------------------------------------------------------------------------- Wound Assessment Details Patient Name: [name], [name]. Date of Service: 03/06/2019 12:30 PM Medical Record Number: [id number] Patient Account Number: [id number] Date of Birth/Sex: [birth year] (73 y.o. F) Treating RN: [name] Primary Care Manuel Dall: [name] Other  Clinician: Referring Paz Winsett: [name] Treating Jamiere Gulas/Extender: STONE III, [name] in Treatment: 12 Wound Status Wound Number: 6 Primary Venous Leg Ulcer Etiology: Wound Location: Left Lower Leg - Lateral Wound Open Wounding Event: Gradually Appeared Status: Date Acquired: 01/19/2019 Comorbid Cataracts, Asthma, Sleep Apnea, Deep Vein [name] Of Treatment: 6 History: Thrombosis, Hypertension, Peripheral Venous Clustered Wound: No Disease, Osteoarthritis, Received Chemotherapy, Received Radiation Wound Measurements Length: (cm) 1.7 Width: (cm) 2.1 Depth: (cm) 0.3 Area: (cm) 2.804 Volume: (cm) 0.841 % Reduction in Area: -1174.5% % Reduction in Volume: -3722.7% Epithelialization: None Tunneling: No Undermining: No Wound Description Full Thickness Without Exposed Support Foul Odo Classification: Structures Slough/F Wound Margin: Flat and Intact Exudate Medium Amount: Exudate Type: Serous Exudate Color: amber r After Cleansing: No ibrino Yes Wound Bed Granulation Amount: None Present (0%) Exposed Structure Necrotic Amount: Large (67-100%) Fascia Exposed: No Necrotic Quality: Adherent Slough Fat Layer (Subcutaneous Tissue) Exposed: Yes Tendon Exposed: No Muscle Exposed: No Joint Exposed: No Bone Exposed: No Periwound Skin Texture Texture Color No Abnormalities Noted: No No Abnormalities Noted: No Callus: No Atrophie Blanche: No Crepitus: No Cyanosis: No Excoriation: No Ecchymosis: No Induration: No Erythema: No Rash: No Hemosiderin Staining: Yes Scarring: No Mottled: No Pallor: No Moisture Rubor: No No Abnormalities Noted: No [name], [name]. ([id number]) Dry / Scaly: No Temperature / Pain Maceration: No Temperature: No Abnormality Tenderness on Palpation: Yes Electronic Signature(s) Signed: 03/12/2019 8:05:03 AM By: [name] Entered By: [name] on 03/06/2019 13:01:24

## 2019-03-12 NOTE — Progress Notes (Signed)
NILSA, MACHT ([id number]) Visit Report for 03/11/2019 Debridement Details Patient Name: [name], [name]. Date of Service: 03/11/2019 10:15 AM Medical Record Number: [id number] Patient Account Number: [id number] Date of Birth/Sex: 1946/05/20 (73 y.o. F) Treating RN: Cornell Barman Primary Care Provider: [name] Other Clinician: Referring Provider: [name] Treating Provider/Extender: Tito Dine in Treatment: 13 Debridement Performed for Wound #5 Left,Medial Lower Leg Assessment: Performed By: Physician [name] Dillon, MD Debridement Type: Debridement Level of Consciousness (Pre- Awake and Alert procedure): Pre-procedure Verification/Time Yes - 10:38 Out Taken: Start Time: 10:38 Pain Control: Lidocaine Total Area Debrided (L x W): 2.6 (cm) x 2.2 (cm) = 5.72 (cm) Tissue and other material Viable, Non-Viable, Slough, Subcutaneous, Slough debrided: Level: Skin/Subcutaneous Tissue Debridement Description: Excisional Instrument: Curette Bleeding: Minimum Hemostasis Achieved: Pressure End Time: 10:40 Response to Treatment: Procedure was tolerated well Level of Consciousness Awake and Alert (Post-procedure): Post Debridement Measurements of Total Wound Length: (cm) 2.6 Width: (cm) 2.2 Depth: (cm) 0.6 Volume: (cm) 2.695 Character of Wound/Ulcer Post Debridement: Requires Further Debridement Post Procedure Diagnosis Same as Pre-procedure Electronic Signature(s) Signed: 03/11/2019 4:51:59 PM By: Linton Ham MD Signed: 03/11/2019 5:13:19 PM By: [name], [name], RN, CWS, [name], [name] Entered By: Linton Ham on 03/11/2019 11:07:27 [name] ([id number]) -------------------------------------------------------------------------------- HPI Details Patient Name: [name], [name]. Date of Service: 03/11/2019 10:15 AM Medical Record Number: [id number] Patient Account Number: [id number] Date of Birth/Sex: [birth year] (73 y.o. F) Treating RN: Cornell Barman Primary Care Provider: [name] Other Clinician: Referring Provider: [name] Treating Provider/Extender: Tito Dine in Treatment: 13 History of Present Illness HPI Description: Pleasant 73 year old with history of chronic venous insufficiency. No diabetes or peripheral vascular disease. Left ABI 1.29. Questionable history of left lower extremity DVT. She developed a recurrent ulceration on her left lateral calf in December 2015, which she attributes to poor diet and subsequent lower extremity edema. She underwent endovenous laser ablation of her left greater saphenous vein in 2010. She underwent laser ablation of accessory branch of left GSV in April 2016 by Dr. Kellie Simmering at [facility]. She was previously wearing Unna boots, which she tolerated well. Tolerating 2 layer compression and cadexomer iodine. She returns to clinic for follow-up and is without new complaints. She denies any significant pain at this time. She reports persistent pain with pressure. No claudication or ischemic rest pain. No fever or chills. No drainage. READMISSION 11/13/16; this is a 73 year old woman who is not a diabetic. She is here for a review of a painful area on her left medial lower extremity. I note that she was seen here previously last year for wound I believe to be in the same area. At that time she had undergone previously a left greater saphenous vein ablation by Dr. Kellie Simmering and she had a ablation of the anterior accessory branch of the left greater saphenous vein in March 2016. Seeing that the wound actually closed over. In reviewing the history with her today the ulcer in this area has been recurrent. She describes a biopsy of this area in 2009 that only showed stasis physiology. 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 Do not see that she has ever had arterial studies in the left leg. She tells me she has a  follow-up with Dr. Kellie Simmering in roughly 10 days In any case she developed the reopening of this area roughly a month ago. On the background of this she describes rapidly increasing edema which has responded to Lasix 40 mg and metolazone 2.5 mg as well as the patient's lymph massage. She has been told she has both venous insufficiency and lymphedema but she cannot tolerate compression stockings 11/28/16; the patient saw Dr. Kellie Simmering recently. Per the patient he did arterial Dopplers in the office that did not show evidence of arterial insufficiency, per the patient he stated "treat this like an ordinary venous ulcer". She also saw her dermatologist Dr. Ronnald Ramp who felt that this was more of a vascular ulcer. In general things are improving although she arrives today with increasing bilateral lower extremity edema with weeping a deeper fluid through the wound on the left medial leg compatible with some degree of lymphedema 12/04/16; the patient's wound is fully epithelialized but I don't think fully healed. We will do another week of depression with Promogran and TCA however I suspect we'll be able to discharge her next week. This is a very unusual-looking wound which was initially a figure-of-eight type wound lying on its side surrounded by petechial like hemorrhage. She has had venous ablation on this side. She apparently does not have an arterial issue per Dr. Kellie Simmering. She saw her dermatologist thought it was "vascular". Patient is definitely going to need ongoing compression and I talked about this with her today she will go to elastic therapy after she leaves here next week 12/11/16; the patient's wound is not completely closed today. She has surrounding scar tissue and in further discussion with the patient it would appear that she had ulcers in this  area in 2009 for a prolonged period of time ultimately requiring a punch biopsy of this area that only showed venous insufficiency. I did not previously pickup on this part of the history from the patient. 12/18/16; the patient's wound is completely epithelialized. There is no open area here. She has significant bilateral venous insufficiency with secondary lymphedema to a mild-to-moderate degree she does not have compression stockings.. She did not say anything to me when I was in the room, she told our intake nurse that she was still having pain in this area. This isn't unusual recurrent small open area. She is going to go to elastic therapy to obtain compression stockings. 12/25/16; the patient's wound is fully epithelialized. There is no open area here. The patient describes some continued episodic discomfort in this area medial left calf. However everything looks fine and healed here. She is been to elastic therapy and caught herself 15-20 mmHg stockings, they apparently were having trouble getting 20-30 mm stockings in her size [name], [name] ([id number]) 01/22/17; this is a patient we discharged from the clinic a month ago. She has a recurrent open wound on her medial left calf. She had 15 mm support stockings. I told her I thought she needed 20-30 mm compression stockings. She tells me that she has been ill with hospitalization secondary to asthma and is been found to have severe hypokalemia likely secondary to a combination of Lasix and metolazone. This morning she noted blistering and leaking fluid on the posterior part of her left leg. She called our intake nurse urgently and we was saw her this afternoon. She has not had any real discomfort here. I don't know that she's been wearing any stockings on this leg for at least 2-3 days. ABIs in  this clinic were 1.21 on the right and 1.3 on the left. She is previously seen vascular surgery who does not think that there is a peripheral arterial  issue. 01/30/17; Patient arrives with no open wound on the left leg. She has been to elastic therapy and obtained 20-19mmhg below knee stockings and she has one on the right leg today. READMISSION 02/19/18; this Bamberg is a now 73 year old patient we've had in this clinic perhaps 3 times before. I had last looked at her from January 07 December 2016 with an area on the medial left leg. We discharged her on 12/25/16 however she had to be readmitted on 01/22/17 with a recurrence. I have in my notes that we discharged her on 20-30 mm stockings although she tells me she was only wearing support hose because she cannot get stockings on predominantly related to her cervical spine surgery/issues. She has had previous ablations done by vein and vascular in [place] including a great saphenous vein ablation on the left with an anterior accessory branch ablation I think both of these were in 2016. On one of the previous visit she had a biopsy noted 2009 that was negative. She is not felt to have an arterial issue. She is not a diabetic. She does have a history of obstructive sleep apnea hypertension asthma as well as chronic venous insufficiency and lymphedema. On this occasion she noted 2 dry scaly patch on her left leg. She tried to put lotion on this it didn't really help. There were 2 open areas.the patient has been seeing her primary physician from 02/05/18 through 02/14/18. She had Unna boots applied. The superior wound now on the lateral left leg has closed but she's had one wound that remains open on the lateral left leg. This is not the same spot as we dealt with in 2018. ABIs in this clinic were 1.3 bilaterally 02/26/18; patient has a small wound on the left lateral calf. Dimensions are down. She has chronic venous insufficiency and lymphedema. 03/05/18; small open area on the left lateral calf. Dimensions are down. Tightly adherent necrotic debris over the surface of the wound which was difficult to  remove. Also the dressing [over collagen] stuck to the wound surface. This was removed with some difficulty as well. Change the primary dressing to Hydrofera Blue ready 03/12/18; small open area on the left lateral calf. Comes in with tightly adherent surface eschar as well as some adherent Hydrofera Blue. 03/19/18; open area on the left lateral calf. Again adherent surface eschar as well as some adherent Hydrofera Blue nonviable subcutaneous tissue. She complained of pain all week even with the reduction from 4-3 layer compression I put on last week. Also she had an increase in her ankle and calf measurements probably related to the same thing. 03/26/18; open area on the left lateral calf. A very small open area remains here. We used silver alginate starting last week as the Hydrofera Blue seem to stick to the wound bed. In using 4-layer compression 04/02/18; the open area in the left lateral calf at some adherent slough which I removed there is no open area here. We are able to transition her into her own compression stocking. Truthfully I think this is probably his support hose. However this does not maintain skin integrity will be limited. She cannot put over the toe compression stockings on because of neck problems hand problems etc. She is allergic to the lining layer of juxta lites. We might be forced to use extremitease  stocking should this fail READMIT 11/24/2018 Patient is now a 73 year old woman who is not a diabetic. She has been in this clinic on at least 3 previous occasions largely with recurrent wounds on her left leg secondary to chronic venous insufficiency with secondary lymphedema. Her situation is complicated by inability to get stockings on and an allergy to neoprene which is apparently a component and at least juxta lites and other stockings. As a result she really has not been wearing any stockings on her legs. She tells [place] that roughly 2 or 3 weeks ago she started noticing a  stinging sensation just above her ankle on the left medial aspect. She has been diagnosed with pseudogout and she wondered whether this was what she was experiencing. She tried to dress this with something she bought at the store however subsequently it pulled skin off and now she has an open wound that is not improving. She has been using Vaseline gauze with a cover bandage. She saw her primary doctor last week who put an Haematologist on her. ABIs in this clinic was 1.03 on the left [name], [name]. ([id number]) 2/12; the area is on the left medial ankle. Odd-looking wound with what looks to be surface epithelialization but a multitude of small petechial openings. This clearly not closed yet. We have been using silver alginate under 3 layer compression with TCA 2/19; the wound area did not look quite as good this week. Necrotic debris over the majority of the wound surface which required debridement. She continues to have a multitude of what looked to be small petechial openings. She reminds [place] that she had a biopsy on this initially during her first outbreak in 2015 in [place] [facility]. She expresses concern about this being a possible melanoma. 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 I assured her that this does not look anything like melanoma. Except for the petechial reaction it does look like a venous insufficiency area and she certainly has evidence of this on both sides 2/26; a difficult area on the left medial ankle. The patient clearly has chronic venous hypertension with some degree of lymphedema. The odd thing about the area is the small petechial hemorrhages. I am not really sure how to explain this. This was present last time and this is not a compression injury. We have been using Hydrofera Blue which I changed to last week 3/4; still using Hydrofera Blue. Aggressive debridement today. She  does not have known arterial issues. She has seen Dr. Kellie Simmering at [facility] and and has an ablation on the left. [Anterior accessory branch of the greater saphenous]. From what I remember they did not feel she had an arterial issue. The patient has had this area biopsied in 2009 at [facility] and by her recollection they said this was "stasis". She is also follow-up with dermatology locally who thought that this was more of a vascular issue 3/11; using Hydrofera Blue. Aggressive debridement today. She does not have an arterial issue. We are using 3 layer compression although we may need to go to 4. The patient has been in for multiple changes to her wrap since I last saw her a week ago. She says that the area was leaking. I do not have too much more information on what was found 01/19/19 on evaluation today patient was actually being seen for a nurse visit when unfortunately she had the  area on her left lateral lower extremity as well as weeping from the right lower extremity that became apparent. Therefore we did end up actually seeing her for a full visit with myself. She is having some pain at this site as well but fortunately nothing too significant at this point. No fevers, chills, nausea, or vomiting noted at this time. 3/18-Patient is back to the clinic with the left leg venous leg ulcer, the ulcer is larger in size, has a surface that is densely adherent with fibrinous tissue, the Hydrofera Blue was used but is densely adherent and there was difficulty in removing it. The right lower extremity was also wrapped for weeping edema. Patient has a new area over the left lateral foot above the malleolus that is small and appears to have no debris with intact surrounding skin. Patient is on increased dose of Lasix also as a means to edema management 3/25; the patient has a nonhealing venous ulcer on the medial left leg and last week developed a smaller area on the  lateral left calf. We have been using Hydrofera Blue with a contact layer. 4/1; no major change in these wounds areas. Left medial and more recently left lateral calf. I tried Iodoflex last week to aid in debridement she did not tolerate this. She stated her pain was terrible all week. She took the top layer of the 4 layer compression off. 4/8; the patient actually looks somewhat better in terms of her more prominent left lateral calf wound. There is some healthy looking tissue here. She is still complaining of a lot of discomfort. 4/15; patient in a lot of pain secondary to sciatica. She is on a prednisone taper prescribed by her primary physician. She has the 2 areas one on the left medial and more recently a smaller area on the left lateral calf. Both of these just above the malleoli 4/22; her back pain is better but she still states she is very uncomfortable and now feels she is intolerant to the [employer]. No real change in the wounds we have been using Sorbact. She has been previously intolerant to Iodoflex. There is not a lot of option about what we can use to debride this wound under compression that she no doubt needs. sHe states Ultram no longer works for her pain 4/29; no major change in the wounds slightly increased depth. Surface on the original medial wound perhaps somewhat improved however the more recent area on the lateral left ankle is 100% covered in very adherent debris we have been using Sorbact. She tolerates 4 layer compression well and her edema control is a lot better. She has not had to come in for a nurse check 5/6; no major change in the condition of the wounds. She did consent to debridement today which was done with some difficulty. Continuing Sorbact. She did not tolerate Iodoflex. She was in for a check of her compression the day after we wrapped her last week this was adjusted but nothing much was found Electronic Signature(s) Signed: 03/11/2019 4:51:59 PM By:  Linton Ham MD Entered By: Linton Ham on 03/11/2019 11:08:20 [name] ([id number]) -------------------------------------------------------------------------------- Physical Exam Details Patient Name: [name], [name]. Date of Service: 03/11/2019 10:15 AM Medical Record Number: [id number] Patient Account Number: [id number] Date of Birth/Sex: 1946/07/04 (73 y.o. F) Treating RN: Cornell Barman Primary Care Provider: [name] Other Clinician: Referring Provider: [name] Treating Provider/Extender: Tito Dine in Treatment: 13 Constitutional Sitting or standing Blood Pressure is  within target range for patient.. Pulse regular and within target range for patient.Marland Kitchen Respirations regular, non-labored and within target range.. Temperature is normal and within the target range for the patient.Marland Kitchen appears in no distress. Notes Wound exam; left medial calf and left lateral calf. Both areas were anesthetized with topical lidocaine and copious amounts of benzocaine. I was able to get some debridement done here but with considerable difficulty. Removed very tightly adherent necrotic debris the original wound medially I was able to get more done than the lateral wound. Hemostasis with direct pressure. Electronic Signature(s) Signed: 03/11/2019 4:51:59 PM By: Linton Ham MD Entered By: Linton Ham on 03/11/2019 11:12:29 [name] ([id number]) -------------------------------------------------------------------------------- Physician Orders Details Patient Name: [name], [name]. Date of Service: 03/11/2019 10:15 AM Medical Record Number: [id number] Patient Account Number: [id number] Date of Birth/Sex: [birth year] (73 y.o. F) Treating RN: Cornell Barman Primary Care Provider: [name] Other Clinician: Referring Provider: [name] Treating Provider/Extender: Tito Dine in Treatment: 13 Verbal / Phone Orders: No Diagnosis  Coding Wound Cleansing Wound #5 Left,Medial Lower Leg o Clean wound with Normal Saline. o May Shower, gently pat wound dry prior to applying new dressing. Wound #6 Left,Lateral Lower Leg o Clean wound with Normal Saline. o May Shower, gently pat wound dry prior to applying new dressing. Anesthetic (add to Medication List) Wound #5 Left,Medial Lower Leg o Topical Lidocaine 4% cream applied to wound bed prior to debridement (In Clinic Only). Wound #6 Left,Lateral Lower Leg o Topical Lidocaine 4% cream applied to wound bed prior to debridement (In Clinic Only). Primary Wound Dressing Wound #5 Left,Medial Lower Leg o Other: - Sorbact Wound #6 Left,Lateral Lower Leg o Other: - Sorbact Secondary Dressing Wound #5 Left,Medial Lower Leg o ABD pad Wound #6 Left,Lateral Lower Leg o ABD pad Dressing Change Frequency Wound #5 Left,Medial Lower Leg o Change dressing every week Wound #6 Left,Lateral Lower Leg o Change dressing every week Follow-up Appointments Wound #5 Left,Medial Lower Leg o Return Appointment in 1 week. o Nurse Visit as needed Wound #6 Left,Lateral Lower Leg [name], [name] ([id number]) o Return Appointment in 1 week. o Nurse Visit as needed Edema Control Wound #5 Left,Medial Lower Leg o 4-Layer Compression System - Left Lower Extremity. Wound #6 Left,Lateral Lower Leg o 4-Layer Compression System - Left Lower Extremity. Off-Loading Wound #5 Left,Medial Lower Leg o Other: - Elevate legs as needed Wound #6 Left,Lateral Lower Leg o Other: - Elevate legs as needed Electronic Signature(s) Signed: 03/11/2019 4:51:59 PM By: Linton Ham MD Signed: 03/11/2019 5:13:19 PM By: [name], [name], RN, CWS, [name], [name] Entered By: [name], [name], RN, CWS, Kim on 03/11/2019 10:41:47 [name], [name] ([id number]) -------------------------------------------------------------------------------- Problem List Details Patient Name: [name], [name]. Date of Service: 03/11/2019 10:15 AM Medical Record Number: [id number] Patient Account Number: [id number] Date of Birth/Sex: [birth year] (73 y.o. F) Treating RN: Cornell Barman Primary Care Provider: [name] Other Clinician: Referring Provider: [name] Treating Provider/Extender: Tito Dine in Treatment: 13 Active Problems ICD-10 Evaluated Encounter Code Description Active Date Today Diagnosis L97.221 Non-pressure chronic ulcer of left calf limited to breakdown of 01/07/2019 No Yes skin I87.321 Chronic venous hypertension (idiopathic) with inflammation of 12/10/2018 No Yes right lower extremity I89.0 Lymphedema, not elsewhere classified 12/10/2018 No Yes Inactive Problems Resolved Problems Electronic Signature(s) Signed: 03/11/2019 4:51:59 PM By: Linton Ham MD Entered By: Linton Ham on 03/11/2019 11:07:06 Rothman, [name] ([id number]) -------------------------------------------------------------------------------- Progress Note Details Patient Name: [name], [name]. Date of Service:  03/11/2019 10:15 AM Medical Record Number: [id number] Patient Account Number: [id number] Date of Birth/Sex: 01-30-1946 (72 y.o. F) Treating RN: Cornell Barman Primary Care Provider: [name] Other Clinician: Referring Provider: [name] Treating Provider/Extender: Tito Dine in Treatment: 13 Subjective History of Present Illness (HPI) Pleasant 73 year old with history of chronic venous insufficiency. No diabetes or peripheral vascular disease. Left ABI 1.29. Questionable history of left lower extremity DVT. She developed a recurrent ulceration on her left lateral calf in December 2015, which she attributes to poor diet and subsequent lower extremity edema. She underwent endovenous laser ablation of her left greater saphenous vein in 2010. She underwent laser ablation of accessory branch of left GSV in April 2016 by Dr. Kellie Simmering at [facility]. She  was previously wearing Unna boots, which she tolerated well. Tolerating 2 layer compression and cadexomer iodine. She returns to clinic for follow-up and is without new complaints. She denies any significant pain at this time. She reports persistent pain with pressure. No claudication or ischemic rest pain. No fever or chills. No drainage. READMISSION 11/13/16; this is a 74 year old woman who is not a diabetic. She is here for a review of a painful area on her left medial lower extremity. I note that she was seen here previously last year for wound I believe to be in the same area. At that time she had undergone previously a left greater saphenous vein ablation by Dr. Kellie Simmering and she had a ablation of the anterior accessory branch of the left greater saphenous vein in March 2016. Seeing that the wound actually closed over. In reviewing the history with her today the ulcer in this area has been recurrent. She describes a biopsy of this area in 2009 that only showed stasis physiology. 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 Do not see that she has ever had arterial studies in the left leg. She tells me she has a follow-up with Dr. Kellie Simmering in roughly 10 days In any case she developed the reopening of this area roughly a month ago. On the background of this she describes rapidly increasing edema which has responded to Lasix 40 mg and metolazone 2.5 mg as well as the patient's lymph massage. She has been told she has both venous insufficiency and lymphedema but she cannot tolerate compression stockings 11/28/16; the patient saw Dr. Kellie Simmering recently. Per the patient he did arterial Dopplers in the office that did not show evidence of arterial insufficiency, per the patient he stated "treat  this like an ordinary venous ulcer". She also saw her dermatologist Dr. Ronnald Ramp who felt that this was more of a vascular ulcer. In general things are improving although she arrives today with increasing bilateral lower extremity edema with weeping a deeper fluid through the wound on the left medial leg compatible with some degree of lymphedema 12/04/16; the patient's wound is fully epithelialized but I don't think fully healed. We will do another week of depression with Promogran and TCA however I suspect we'll be able to discharge her next week. This is a very unusual-looking wound which was initially a figure-of-eight type wound lying on its side surrounded by petechial like hemorrhage. She has had venous ablation on this side. She apparently does not have an arterial issue per Dr.  Kellie Simmering. She saw her dermatologist thought it was "vascular". Patient is definitely going to need ongoing compression and I talked about this with her today she will go to elastic therapy after she leaves here next week 12/11/16; the patient's wound is not completely closed today. She has surrounding scar tissue and in further discussion with the patient it would appear that she had ulcers in this area in 2009 for a prolonged period of time ultimately requiring a punch biopsy of this area that only showed venous insufficiency. I did not previously pickup on this part of the history from the patient. 12/18/16; the patient's wound is completely epithelialized. There is no open area here. She has significant bilateral venous insufficiency with secondary lymphedema to a mild-to-moderate degree she does not have compression stockings.. She did not say anything to me when I was in the room, she told our intake nurse that she was still having pain in this area. This isn't unusual recurrent small open area. She is going to go to elastic therapy to obtain compression stockings. 12/25/16; the patient's wound is fully epithelialized.  There is no open area here. The patient describes some continued episodic discomfort in this area medial left calf. However everything looks fine and healed here. She is been to elastic therapy and TACARA, HADLOCK ([id number]) caught herself 15-20 mmHg stockings, they apparently were having trouble getting 20-30 mm stockings in her size 01/22/17; this is a patient we discharged from the clinic a month ago. She has a recurrent open wound on her medial left calf. She had 15 mm support stockings. I told her I thought she needed 20-30 mm compression stockings. She tells me that she has been ill with hospitalization secondary to asthma and is been found to have severe hypokalemia likely secondary to a combination of Lasix and metolazone. This morning she noted blistering and leaking fluid on the posterior part of her left leg. She called our intake nurse urgently and we was saw her this afternoon. She has not had any real discomfort here. I don't know that she's been wearing any stockings on this leg for at least 2-3 days. ABIs in this clinic were 1.21 on the right and 1.3 on the left. She is previously seen vascular surgery who does not think that there is a peripheral arterial issue. 01/30/17; Patient arrives with no open wound on the left leg. She has been to elastic therapy and obtained 20-70mmhg below knee stockings and she has one on the right leg today. READMISSION 02/19/18; this Rudden is a now 73 year old patient we've had in this clinic perhaps 3 times before. I had last looked at her from January 07 December 2016 with an area on the medial left leg. We discharged her on 12/25/16 however she had to be readmitted on 01/22/17 with a recurrence. I have in my notes that we discharged her on 20-30 mm stockings although she tells me she was only wearing support hose because she cannot get stockings on predominantly related to her cervical spine surgery/issues. She has had previous ablations done by vein  and vascular in [place] including a great saphenous vein ablation on the left with an anterior accessory branch ablation I think both of these were in 2016. On one of the previous visit she had a biopsy noted 2009 that was negative. She is not felt to have an arterial issue. She is not a diabetic. She does have a history of obstructive sleep apnea hypertension asthma as well as chronic  venous insufficiency and lymphedema. On this occasion she noted 2 dry scaly patch on her left leg. She tried to put lotion on this it didn't really help. There were 2 open areas.the patient has been seeing her primary physician from 02/05/18 through 02/14/18. She had Unna boots applied. The superior wound now on the lateral left leg has closed but she's had one wound that remains open on the lateral left leg. This is not the same spot as we dealt with in 2018. ABIs in this clinic were 1.3 bilaterally 02/26/18; patient has a small wound on the left lateral calf. Dimensions are down. She has chronic venous insufficiency and lymphedema. 03/05/18; small open area on the left lateral calf. Dimensions are down. Tightly adherent necrotic debris over the surface of the wound which was difficult to remove. Also the dressing [over collagen] stuck to the wound surface. This was removed with some difficulty as well. Change the primary dressing to Hydrofera Blue ready 03/12/18; small open area on the left lateral calf. Comes in with tightly adherent surface eschar as well as some adherent Hydrofera Blue. 03/19/18; open area on the left lateral calf. Again adherent surface eschar as well as some adherent Hydrofera Blue nonviable subcutaneous tissue. She complained of pain all week even with the reduction from 4-3 layer compression I put on last week. Also she had an increase in her ankle and calf measurements probably related to the same thing. 03/26/18; open area on the left lateral calf. A very small open area remains here. We used  silver alginate starting last week as the Hydrofera Blue seem to stick to the wound bed. In using 4-layer compression 04/02/18; the open area in the left lateral calf at some adherent slough which I removed there is no open area here. We are able to transition her into her own compression stocking. Truthfully I think this is probably his support hose. However this does not maintain skin integrity will be limited. She cannot put over the toe compression stockings on because of neck problems hand problems etc. She is allergic to the lining layer of juxta lites. We might be forced to use extremitease stocking should this fail READMIT 11/24/2018 Patient is now a 73 year old woman who is not a diabetic. She has been in this clinic on at least 3 previous occasions largely with recurrent wounds on her left leg secondary to chronic venous insufficiency with secondary lymphedema. Her situation is complicated by inability to get stockings on and an allergy to neoprene which is apparently a component and at least juxta lites and other stockings. As a result she really has not been wearing any stockings on her legs. She tells [place] that roughly 2 or 3 weeks ago she started noticing a stinging sensation just above her ankle on the left medial aspect. She has been diagnosed with pseudogout and she wondered whether this was what she was experiencing. She tried to dress this with something she bought at the store however subsequently it pulled skin off and now she has an open wound that is not improving. She has been using Vaseline gauze with a cover bandage. She saw her primary doctor last week who put an Haematologist on her. ABIs in this clinic was 1.03 on the left [name], [name]. ([id number]) 2/12; the area is on the left medial ankle. Odd-looking wound with what looks to be surface epithelialization but a multitude of small petechial openings. This clearly not closed yet. We have been using silver alginate  under 3  layer compression with TCA 2/19; the wound area did not look quite as good this week. Necrotic debris over the majority of the wound surface which required debridement. She continues to have a multitude of what looked to be small petechial openings. She reminds [place] that she had a biopsy on this initially during her first outbreak in 2015 in [place] dermatology. She expresses concern about this being a possible melanoma. 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 I assured her that this does not look anything like melanoma. Except for the petechial reaction it does look like a venous insufficiency area and she certainly has evidence of this on both sides 2/26; a difficult area on the left medial ankle. The patient clearly has chronic venous hypertension with some degree of lymphedema. The odd thing about the area is the small petechial hemorrhages. I am not really sure how to explain this. This was present last time and this is not a compression injury. We have been using Hydrofera Blue which I changed to last week 3/4; still using Hydrofera Blue. Aggressive debridement today. She does not have known arterial issues. She has seen Dr. Kellie Simmering at [facility] vein and vascular and and has an ablation on the left. [Anterior accessory branch of the greater saphenous]. From what I remember they did not feel she had an arterial issue. The patient has had this area biopsied in 2009 at [facility] dermatology and by her recollection they said this was "stasis". She is also follow-up with dermatology locally who thought that this was more of a vascular issue 3/11; using Hydrofera Blue. Aggressive debridement today. She does not have an arterial issue. We are using 3 layer compression although we may need to go to 4. The patient has been in for multiple changes to her wrap since I last saw her a week ago. She says that the  area was leaking. I do not have too much more information on what was found 01/19/19 on evaluation today patient was actually being seen for a nurse visit when unfortunately she had the area on her left lateral lower extremity as well as weeping from the right lower extremity that became apparent. Therefore we did end up actually seeing her for a full visit with myself. She is having some pain at this site as well but fortunately nothing too significant at this point. No fevers, chills, nausea, or vomiting noted at this time. 3/18-Patient is back to the clinic with the left leg venous leg ulcer, the ulcer is larger in size, has a surface that is densely adherent with fibrinous tissue, the Hydrofera Blue was used but is densely adherent and there was difficulty in removing it. The right lower extremity was also wrapped for weeping edema. Patient has a new area over the left lateral foot above the malleolus that is small and appears to have no debris with intact surrounding skin. Patient is on increased dose of Lasix also as a means to edema management 3/25; the patient has a nonhealing venous ulcer on the medial left leg and last week developed a smaller area on the lateral left calf. We have been using Hydrofera Blue with a contact layer. 4/1; no major change in these wounds areas. Left medial and more recently left lateral calf. I tried Iodoflex last week to aid in debridement she did not tolerate this. She stated her pain was terrible all week. She  took the top layer of the 4 layer compression off. 4/8; the patient actually looks somewhat better in terms of her more prominent left lateral calf wound. There is some healthy looking tissue here. She is still complaining of a lot of discomfort. 4/15; patient in a lot of pain secondary to sciatica. She is on a prednisone taper prescribed by her primary physician. She has the 2 areas one on the left medial and more recently a smaller area on the left  lateral calf. Both of these just above the malleoli 4/22; her back pain is better but she still states she is very uncomfortable and now feels she is intolerant to the [employer]. No real change in the wounds we have been using Sorbact. She has been previously intolerant to Iodoflex. There is not a lot of option about what we can use to debride this wound under compression that she no doubt needs. sHe states Ultram no longer works for her pain 4/29; no major change in the wounds slightly increased depth. Surface on the original medial wound perhaps somewhat improved however the more recent area on the lateral left ankle is 100% covered in very adherent debris we have been using Sorbact. She tolerates 4 layer compression well and her edema control is a lot better. She has not had to come in for a nurse check 5/6; no major change in the condition of the wounds. She did consent to debridement today which was done with some difficulty. Continuing Sorbact. She did not tolerate Iodoflex. She was in for a check of her compression the day after we wrapped her last week this was adjusted but nothing much was found [name], [name]. ([id number]) Objective Constitutional Sitting or standing Blood Pressure is within target range for patient.. Pulse regular and within target range for patient.Marland Kitchen Respirations regular, non-labored and within target range.. Temperature is normal and within the target range for the patient.Marland Kitchen appears in no distress. Vitals Time Taken: 10:18 AM, Height: 63 in, Weight: 224.7 lbs, BMI: 39.8, Temperature: 98.0 F, Pulse: 91 bpm, Respiratory Rate: 18 breaths/min, Blood Pressure: 133/76 mmHg. General Notes: Wound exam; left medial calf and left lateral calf. Both areas were anesthetized with topical lidocaine and copious amounts of benzocaine. I was able to get some debridement done here but with considerable difficulty. Removed very tightly adherent necrotic debris the original wound  medially I was able to get more done than the lateral wound. Hemostasis with direct pressure. Integumentary (Hair, Skin) Wound #5 status is Open. Original cause of wound was Gradually Appeared. The wound is located on the Left,Medial Lower Leg. The wound measures 2.6cm length x 2.2cm width x 0.4cm depth; 4.492cm^2 area and 1.797cm^3 volume. There is Fat Layer (Subcutaneous Tissue) Exposed exposed. There is a medium amount of serosanguineous drainage noted. The wound margin is indistinct and nonvisible. There is small (1-33%) red, pink granulation within the wound bed. There is a large (67- 100%) amount of necrotic tissue within the wound bed including Adherent Slough. The periwound skin appearance exhibited: Maceration, Hemosiderin Staining. The periwound skin appearance did not exhibit: Callus, Crepitus, Excoriation, Induration, Rash, Scarring, Dry/Scaly, Atrophie Blanche, Cyanosis, Ecchymosis, Mottled, Pallor, Rubor, Erythema. Periwound temperature was noted as No Abnormality. The periwound has tenderness on palpation. Wound #6 status is Open. Original cause of wound was Gradually Appeared. The wound is located on the Left,Lateral Lower Leg. The wound measures 1.7cm length x 2.1cm width x 0.3cm depth; 2.804cm^2 area and 0.841cm^3 volume. There is Fat Layer (Subcutaneous  Tissue) Exposed exposed. There is no tunneling or undermining noted. There is a medium amount of serous drainage noted. The wound margin is flat and intact. There is no granulation within the wound bed. There is a large (67-100%) amount of necrotic tissue within the wound bed including Adherent Slough. The periwound skin appearance exhibited: Hemosiderin Staining. The periwound skin appearance did not exhibit: Callus, Crepitus, Excoriation, Induration, Rash, Scarring, Dry/Scaly, Maceration, Atrophie Blanche, Cyanosis, Ecchymosis, Mottled, Pallor, Rubor, Erythema. Periwound temperature was noted as No Abnormality. The periwound has  tenderness on palpation. Assessment Active Problems ICD-10 Non-pressure chronic ulcer of left calf limited to breakdown of skin Chronic venous hypertension (idiopathic) with inflammation of right lower extremity Lymphedema, not elsewhere classified Procedures Wound #5 Pre-procedure diagnosis of Wound #5 is a Lymphedema located on the Left,Medial Lower Leg . There was a Excisional [name], VERVILLE. ([id number]) Skin/Subcutaneous Tissue Debridement with a total area of 5.72 sq cm performed by [name] Dillon, MD. With the following instrument(s): Curette to remove Viable and Non-Viable tissue/material. Material removed includes Subcutaneous Tissue and Slough and after achieving pain control using Lidocaine. No specimens were taken. A time out was conducted at 10:38, prior to the start of the procedure. A Minimum amount of bleeding was controlled with Pressure. The procedure was tolerated well. Post Debridement Measurements: 2.6cm length x 2.2cm width x 0.6cm depth; 2.695cm^3 volume. Character of Wound/Ulcer Post Debridement requires further debridement. Post procedure Diagnosis Wound #5: Same as Pre-Procedure Plan Wound Cleansing: Wound #5 Left,Medial Lower Leg: Clean wound with Normal Saline. May Shower, gently pat wound dry prior to applying new dressing. Wound #6 Left,Lateral Lower Leg: Clean wound with Normal Saline. May Shower, gently pat wound dry prior to applying new dressing. Anesthetic (add to Medication List): Wound #5 Left,Medial Lower Leg: Topical Lidocaine 4% cream applied to wound bed prior to debridement (In Clinic Only). Wound #6 Left,Lateral Lower Leg: Topical Lidocaine 4% cream applied to wound bed prior to debridement (In Clinic Only). Primary Wound Dressing: Wound #5 Left,Medial Lower Leg: Other: - Sorbact Wound #6 Left,Lateral Lower Leg: Other: - Sorbact Secondary Dressing: Wound #5 Left,Medial Lower Leg: ABD pad Wound #6 Left,Lateral Lower Leg: ABD  pad Dressing Change Frequency: Wound #5 Left,Medial Lower Leg: Change dressing every week Wound #6 Left,Lateral Lower Leg: Change dressing every week Follow-up Appointments: Wound #5 Left,Medial Lower Leg: Return Appointment in 1 week. Nurse Visit as needed Wound #6 Left,Lateral Lower Leg: Return Appointment in 1 week. Nurse Visit as needed Edema Control: Wound #5 Left,Medial Lower Leg: 4-Layer Compression System - Left Lower Extremity. Wound #6 Left,Lateral Lower Leg: 4-Layer Compression System - Left Lower Extremity. Off-Loading: Wound #5 Left,Medial Lower Leg: Other: - Elevate legs as needed Wound #6 Left,Lateral Lower Leg: Other: - Elevate legs as needed MARQUITA, LIAS. ([id number]) 1. I am continuing with the Sorbact, there is only a limited number of options here 2. Continuing in 4 layer compression which she appears to be tolerating fairly well 3. Once we get the surface of both of these areas to something that will support healing an advanced treatment option may be necessary Electronic Signature(s) Signed: 03/11/2019 4:51:59 PM By: Linton Ham MD Entered By: Linton Ham on 03/11/2019 11:13:26 Pociask, [name] ([id number]) -------------------------------------------------------------------------------- SuperBill Details Patient Name: KAMBREE, KRAUSS. Date of Service: 03/11/2019 Medical Record Number: [id number] Patient Account Number: [id number] Date of Birth/Sex: 1946-06-02 (73 y.o. F) Treating RN: Cornell Barman Primary Care Provider: [name] Other Clinician: Referring Provider: [name] Treating Provider/Extender: Dellia Nims  Kordae Buonocore G Weeks in Treatment: 13 Diagnosis Coding ICD-10 Codes Code Description [phone] Non-pressure chronic ulcer of left calf limited to breakdown of skin I87.321 Chronic venous hypertension (idiopathic) with inflammation of right lower extremity I89.0 Lymphedema, not elsewhere classified Facility Procedures CPT4 Code  Description: [id number] 11042 - DEB SUBQ TISSUE 20 SQ CM/< ICD-10 Diagnosis Description L97.221 Non-pressure chronic ulcer of left calf limited to breakdown I87.321 Chronic venous hypertension (idiopathic) with inflammation of I89.0 Lymphedema,  not elsewhere classified Modifier: of skin right lower ext Quantity: 1 remity Physician Procedures CPT4 Code Description: [id number] 11042 - WC PHYS SUBQ TISS 20 SQ CM ICD-10 Diagnosis Description L97.221 Non-pressure chronic ulcer of left calf limited to breakdown I87.321 Chronic venous hypertension (idiopathic) with inflammation of I89.0 Lymphedema,  not elsewhere classified Modifier: of skin right lower extr Quantity: 1 emity Electronic Signature(s) Signed: 03/11/2019 4:51:59 PM By: Linton Ham MD Entered By: Linton Ham on 03/11/2019 11:13:42

## 2019-03-12 NOTE — Progress Notes (Signed)
[name] ([id number]) Visit Report for 03/11/2019 Arrival Information Details Patient Name: [name], [name]. Date of Service: 03/11/2019 10:15 AM Medical Record Number: [id number] Patient Account Number: [id number] Date of Birth/Sex: 1946/10/28 (73 y.o. F) Treating RN: [name] Primary Care [name]: [name] Other Clinician: Referring [name]: [name] Treating [name]/Extender: [name] in Treatment: 13 Visit Information History Since Last Visit Added or deleted any medications: No Patient Arrived: Ambulatory Any new allergies or adverse reactions: No Arrival Time: 10:14 Had a fall or experienced change in No Accompanied By: self activities of daily living that may affect Transfer Assistance: None risk of falls: Patient Identification Verified: Yes Signs or symptoms of abuse/neglect since last visito No Secondary Verification Process Completed: Yes Hospitalized since last visit: No Has Dressing in Place as Prescribed: Yes Has Compression in Place as Prescribed: Yes Pain Present Now: Yes Electronic Signature(s) Signed: 03/12/2019 8:05:03 AM By: [name] Entered By: [name] on 03/11/2019 10:16:02 [name] ([id number]) -------------------------------------------------------------------------------- Encounter Discharge Information Details Patient Name: [name], [name]. Date of Service: 03/11/2019 10:15 AM Medical Record Number: [id number] Patient Account Number: [id number] Date of Birth/Sex: 1946-07-01 (73 y.o. F) Treating RN: [name] Primary Care [name]: [name] Other Clinician: Referring Bravlio Luca: [name] Treating Mayetta Castleman/Extender: [name] in Treatment: 13 Encounter Discharge Information Items Post Procedure Vitals Discharge Condition: Stable Temperature (F): 98.0 Ambulatory Status: Ambulatory Pulse (bpm): 91 Discharge Destination: Home Respiratory Rate  (breaths/min): 18 Transportation: Private Auto Blood Pressure (mmHg): 133/76 Accompanied By: self Schedule Follow-up Appointment: Yes Clinical Summary of Care: Electronic Signature(s) Signed: 03/12/2019 8:05:03 AM By: [name] Entered By: [name] on 03/11/2019 11:02:57 [name], [name] ([id number]) -------------------------------------------------------------------------------- Lower Extremity Assessment Details Patient Name: [name], [name]. Date of Service: 03/11/2019 10:15 AM Medical Record Number: [id number] Patient Account Number: [id number] Date of Birth/Sex: 15-Aug-1946 (73 y.o. F) Treating RN: [name] Primary Care [name]: [name] Other Clinician: Referring [name]: [name] Treating Ariyan Brisendine/Extender: [name] in Treatment: 13 Edema Assessment Assessed: [Left: No] [Right: No] [Left: Edema] [Right: :] Calf Left: Right: Point of Measurement: 31 cm From Medial Instep 39.5 cm cm Ankle Left: Right: Point of Measurement: 13 cm From Medial Instep 24.2 cm cm Vascular Assessment Pulses: Dorsalis Pedis Palpable: [Left:Yes] Posterior Tibial Palpable: [Left:Yes] Electronic Signature(s) Signed: 03/12/2019 8:05:03 AM By: [name] Entered By: [name] on 03/11/2019 10:26:23 [name] ([id number]) -------------------------------------------------------------------------------- Multi Wound Chart Details Patient Name: [name], [name]. Date of Service: 03/11/2019 10:15 AM Medical Record Number: [id number] Patient Account Number: [id number] Date of Birth/Sex: Dec 31, 1945 (73 y.o. F) Treating RN: [name] Primary Care Nayquan Evinger: [name] Other Clinician: Referring [name]: [name] Treating [name]/Extender: [name] in Treatment: 13 Vital Signs Height(in): 63 Pulse(bpm): 72 Weight(lbs): 224.7 Blood Pressure(mmHg): 133/76 Body Mass Index(BMI): 40 Temperature(F):  98.0 Respiratory Rate 18 (breaths/min): Photos: [N/A:N/A] Wound Location: Left Lower Leg - Medial Left Lower Leg - Lateral N/A Wounding Event: Gradually Appeared Gradually Appeared N/A Primary Etiology: Lymphedema Venous Leg Ulcer N/A Comorbid History: Cataracts, Asthma, Sleep Cataracts, Asthma, Sleep N/A Apnea, Deep Vein Apnea, Deep Vein Thrombosis, Hypertension, Thrombosis, Hypertension, Peripheral Venous Disease, Peripheral Venous Disease, Osteoarthritis, Received Osteoarthritis, Received Chemotherapy, Received Chemotherapy, Received Radiation Radiation Date Acquired: 11/19/2018 01/19/2019 N/A Weeks of Treatment: 13 7 N/A Wound Status: Open Open N/A Measurements L x W x D 2.6x2.2x0.4 1.7x2.1x0.3 N/A (cm) Area (cm) : 4.492 2.804 N/A Volume (cm) : 1.797 0.841 N/A % Reduction in Area: 47.40% -  1174.50% N/A % Reduction in Volume: -110.20% -3722.70% N/A Classification: Full Thickness Without Full Thickness Without N/A Exposed Support Structures Exposed Support Structures Exudate Amount: Medium Medium N/A Exudate Type: Serosanguineous Serous N/A Exudate Color: red, brown amber N/A Wound Margin: Indistinct, nonvisible Flat and Intact N/A Granulation Amount: Small (1-33%) None Present (0%) N/A Granulation Quality: Red, Pink N/A N/A Necrotic Amount: Large (67-100%) Large (67-100%) N/A Exposed Structures: N/A [name] ([id number]) Fat Layer (Subcutaneous Fat Layer (Subcutaneous Tissue) Exposed: Yes Tissue) Exposed: Yes Fascia: No Fascia: No Tendon: No Tendon: No Muscle: No Muscle: No Joint: No Joint: No Bone: No Bone: No Epithelialization: None None N/A Debridement: Debridement - Excisional N/A N/A Pre-procedure 10:38 N/A N/A Verification/Time Out Taken: Pain Control: Lidocaine N/A N/A Tissue Debrided: Subcutaneous, Slough N/A N/A Level: Skin/Subcutaneous Tissue N/A N/A Debridement Area (sq cm): 5.72 N/A N/A Instrument: Curette N/A N/A Bleeding: Minimum N/A  N/A Hemostasis Achieved: Pressure N/A N/A Debridement Treatment Procedure was tolerated well N/A N/A Response: Post Debridement 2.6x2.2x0.6 N/A N/A Measurements L x W x D (cm) Post Debridement Volume: 2.695 N/A N/A (cm) Periwound Skin Texture: Excoriation: No Excoriation: No N/A Induration: No Induration: No Callus: No Callus: No Crepitus: No Crepitus: No Rash: No Rash: No Scarring: No Scarring: No Periwound Skin Moisture: Maceration: Yes Maceration: No N/A Dry/Scaly: No Dry/Scaly: No Periwound Skin Color: Hemosiderin Staining: Yes Hemosiderin Staining: Yes N/A Atrophie Blanche: No Atrophie Blanche: No Cyanosis: No Cyanosis: No Ecchymosis: No Ecchymosis: No Erythema: No Erythema: No Mottled: No Mottled: No Pallor: No Pallor: No Rubor: No Rubor: No Temperature: No Abnormality No Abnormality N/A Tenderness on Palpation: Yes Yes N/A Procedures Performed: Debridement N/A N/A Treatment Notes Wound #5 (Left, Medial Lower Leg) Notes Sorbact, ABD, 4-Layer Wound #6 (Left, Lateral Lower Leg) Notes Sorbact, ABD, 4-Layer [name], [name] ([id number]) Electronic Signature(s) Signed: 03/11/2019 4:51:59 PM By: Linton Ham MD Entered By: Linton Ham on 03/11/2019 11:07:18 [name] ([id number]) -------------------------------------------------------------------------------- Multi-Disciplinary Care Plan Details Patient Name: STEVEN, VEAZIE. Date of Service: 03/11/2019 10:15 AM Medical Record Number: [id number] Patient Account Number: [id number] Date of Birth/Sex: April 14, 1946 (73 y.o. F) Treating RN: [name] Primary Care Dellis Voght: [name] Other Clinician: Referring Tausha Milhoan: [name] Treating Zaylei Mullane/Extender: [name] in Treatment: 13 Active Inactive Orientation to the Wound Care Program Nursing Diagnoses: Knowledge deficit related to the wound healing center program Goals: Patient/caregiver will verbalize  understanding of the [facility] Date Initiated: 12/10/2018 Target Resolution Date: 01/09/2019 Goal Status: Active Interventions: Provide education on orientation to the wound center Notes: Soft Tissue Infection Nursing Diagnoses: Impaired tissue integrity Goals: Patient's soft tissue infection will resolve Date Initiated: 12/10/2018 Target Resolution Date: 01/09/2019 Goal Status: Active Interventions: Assess signs and symptoms of infection every visit Notes: Venous Leg Ulcer Nursing Diagnoses: Actual venous Insuffiency (use after diagnosis is confirmed) Goals: Patient will maintain optimal edema control Date Initiated: 12/10/2018 Target Resolution Date: 01/09/2019 Goal Status: Active Interventions: Assess peripheral edema status every visit. MIONNA, ADVINCULA ([id number]) Treatment Activities: Therapeutic compression applied : 12/10/2018 Notes: Wound/Skin Impairment Nursing Diagnoses: Impaired tissue integrity Goals: Patient/caregiver will verbalize understanding of skin care regimen Date Initiated: 12/10/2018 Target Resolution Date: 01/09/2019 Goal Status: Active Interventions: Assess ulceration(s) every visit Treatment Activities: Topical wound management initiated : 12/10/2018 Notes: Electronic Signature(s) Signed: 03/11/2019 5:13:19 PM By: [name], [name], RN, CWS, [name], [name] Entered By: [name], [name], RN, CWS, Kim on 03/11/2019 10:37:46 Schlarb, [name] ([id number]) -------------------------------------------------------------------------------- Pain Assessment Details Patient Name: [name], [name].  Date of Service: 03/11/2019 10:15 AM Medical Record Number: [id number] Patient Account Number: [id number] Date of Birth/Sex: October 22, 1946 (73 y.o. F) Treating RN: [name] Primary Care Jadesola Poynter: [name] Other Clinician: Referring Terrisa Curfman: [name] Treating Shaunita Seney/Extender: [name] in Treatment: 13 Active Problems Location of Pain  Severity and Description of Pain Patient Has Paino Yes Site Locations Rate the pain. Current Pain Level: 1 Pain Management and Medication Current Pain Management: Electronic Signature(s) Signed: 03/12/2019 8:05:03 AM By: [name] Entered By: [name] on 03/11/2019 10:17:59 [name], [name] ([id number]) -------------------------------------------------------------------------------- Patient/Caregiver Education Details Patient Name: [name], [name]. Date of Service: 03/11/2019 10:15 AM Medical Record Number: [id number] Patient Account Number: [id number] Date of Birth/Gender: [birth year] (73 y.o. F) Treating RN: [name] Primary Care Physician: [name] Other Clinician: Referring Physician: Ria [name] Treating Physician/Extender: [name] in Treatment: 13 Education Assessment Education Provided To: Patient Education Topics Provided Wound Debridement: Handouts: Wound Debridement Methods: Demonstration, Explain/Verbal Responses: State content correctly Wound/Skin Impairment: Handouts: Caring for Your Ulcer Methods: Demonstration, Explain/Verbal Responses: State content correctly Electronic Signature(s) Signed: 03/11/2019 5:13:19 PM By: [name], [name], RN, CWS, [name], [name] Entered By: [name], [name], RN, CWS, Kim on 03/11/2019 10:42:33 [name] ([id number]) -------------------------------------------------------------------------------- Wound Assessment Details Patient Name: [name], [name]. Date of Service: 03/11/2019 10:15 AM Medical Record Number: [id number] Patient Account Number: [id number] Date of Birth/Sex: 01-06-1946 (73 y.o. F) Treating RN: [name] Primary Care Lavina Resor: [name] Other Clinician: Referring Berish Bohman: [name] Treating Lydia Meng/Extender: [name] in Treatment: 13 Wound Status Wound Number: 5 Primary Lymphedema Etiology: Wound Location: Left Lower Leg - Medial Wound  Open Wounding Event: Gradually Appeared Status: Date Acquired: 11/19/2018 Comorbid Cataracts, Asthma, Sleep Apnea, Deep Vein Weeks Of Treatment: 13 History: Thrombosis, Hypertension, Peripheral Venous Clustered Wound: No Disease, Osteoarthritis, Received Chemotherapy, Received Radiation Photos Wound Measurements Length: (cm) 2.6 Width: (cm) 2.2 Depth: (cm) 0.4 Area: (cm) 4.492 Volume: (cm) 1.797 % Reduction in Area: 47.4% % Reduction in Volume: -110.2% Epithelialization: None Wound Description Full Thickness Without Exposed Support Classification: Structures Wound Margin: Indistinct, nonvisible Exudate Medium Amount: Exudate Type: Serosanguineous Exudate Color: red, brown Foul Odor After Cleansing: No Slough/Fibrino Yes Wound Bed Granulation Amount: Small (1-33%) Exposed Structure Granulation Quality: Red, Pink Fascia Exposed: No Necrotic Amount: Large (67-100%) Fat Layer (Subcutaneous Tissue) Exposed: Yes Necrotic Quality: Adherent Slough Tendon Exposed: No Muscle Exposed: No Joint Exposed: No Bone Exposed: No [name], [name] ([id number]) Periwound Skin Texture Texture Color No Abnormalities Noted: No No Abnormalities Noted: No Callus: No Atrophie Blanche: No Crepitus: No Cyanosis: No Excoriation: No Ecchymosis: No Induration: No Erythema: No Rash: No Hemosiderin Staining: Yes Scarring: No Mottled: No Pallor: No Moisture Rubor: No No Abnormalities Noted: No Dry / Scaly: No Temperature / Pain Maceration: Yes Temperature: No Abnormality Tenderness on Palpation: Yes Treatment Notes Wound #5 (Left, Medial Lower Leg) Notes Sorbact, ABD, 4-Layer Electronic Signature(s) Signed: 03/12/2019 8:05:03 AM By: [name] Entered By: [name] on 03/11/2019 10:31:21 [name] ([id number]) -------------------------------------------------------------------------------- Wound Assessment Details Patient Name: [name], [name]. Date of  Service: 03/11/2019 10:15 AM Medical Record Number: [id number] Patient Account Number: [id number] Date of Birth/Sex: [birth year] (73 y.o. F) Treating RN: [name] Primary Care Alleen Kehm: [name] Other Clinician: Referring Shaiden Aldous: [name] Treating Rita Vialpando/Extender: [name] in Treatment: 13 Wound Status Wound Number: 6 Primary Venous Leg Ulcer Etiology: Wound Location: Left Lower Leg - Lateral Wound Open Wounding Event: Gradually Appeared Status: Date Acquired:  01/19/2019 Comorbid Cataracts, Asthma, Sleep Apnea, Deep Vein Weeks Of Treatment: 7 History: Thrombosis, Hypertension, Peripheral Venous Clustered Wound: No Disease, Osteoarthritis, Received Chemotherapy, Received Radiation Photos Wound Measurements Length: (cm) 1.7 Width: (cm) 2.1 Depth: (cm) 0.3 Area: (cm) 2.804 Volume: (cm) 0.841 % Reduction in Area: -1174.5% % Reduction in Volume: -3722.7% Epithelialization: None Tunneling: No Undermining: No Wound Description Full Thickness Without Exposed Support Foul O Classification: Structures Slough Wound Margin: Flat and Intact Exudate Medium Amount: Exudate Type: Serous Exudate Color: amber dor After Cleansing: No /Fibrino Yes Wound Bed Granulation Amount: None Present (0%) Exposed Structure Necrotic Amount: Large (67-100%) Fascia Exposed: No Necrotic Quality: Adherent Slough Fat Layer (Subcutaneous Tissue) Exposed: Yes Tendon Exposed: No Muscle Exposed: No Joint Exposed: No Bone Exposed: No Pocock, Haizley J. ([id number]) Periwound Skin Texture Texture Color No Abnormalities Noted: No No Abnormalities Noted: No Callus: No Atrophie Blanche: No Crepitus: No Cyanosis: No Excoriation: No Ecchymosis: No Induration: No Erythema: No Rash: No Hemosiderin Staining: Yes Scarring: No Mottled: No Pallor: No Moisture Rubor: No No Abnormalities Noted: No Dry / Scaly: No Temperature / Pain Maceration:  No Temperature: No Abnormality Tenderness on Palpation: Yes Treatment Notes Wound #6 (Left, Lateral Lower Leg) Notes Sorbact, ABD, 4-Layer Electronic Signature(s) Signed: 03/12/2019 8:05:03 AM By: [name] Entered By: [name] on 03/11/2019 10:31:44 Weissman, [name] ([id number]) -------------------------------------------------------------------------------- Vitals Details Patient Name: [name], [name]. Date of Service: 03/11/2019 10:15 AM Medical Record Number: [id number] Patient Account Number: [id number] Date of Birth/Sex: April 18, 1946 (73 y.o. F) Treating RN: [name] Primary Care Kayston Jodoin: [name] Other Clinician: Referring Imer Foxworth: [name] Treating Bevin Mayall/Extender: [name] in Treatment: 13 Vital Signs Time Taken: 10:18 Temperature (F): 98.0 Height (in): 63 Pulse (bpm): 91 Weight (lbs): 224.7 Respiratory Rate (breaths/min): 18 Body Mass Index (BMI): 39.8 Blood Pressure (mmHg): 133/76 Reference Range: 80 - 120 mg / dl Electronic Signature(s) Signed: 03/12/2019 8:05:03 AM By: [name] Entered By: [name] on 03/11/2019 10:18:33

## 2019-03-13 ENCOUNTER — Other Ambulatory Visit: Payer: Self-pay

## 2019-03-13 DIAGNOSIS — L97221 Non-pressure chronic ulcer of left calf limited to breakdown of skin: Secondary | ICD-10-CM | POA: Diagnosis not present

## 2019-03-13 DIAGNOSIS — I87332 Chronic venous hypertension (idiopathic) with ulcer and inflammation of left lower extremity: Secondary | ICD-10-CM | POA: Diagnosis not present

## 2019-03-13 DIAGNOSIS — I890 Lymphedema, not elsewhere classified: Secondary | ICD-10-CM | POA: Diagnosis not present

## 2019-03-13 NOTE — Progress Notes (Signed)
[name], [name] ([id number]) Visit Report for 03/13/2019 Arrival Information Details Patient Name: [name], [name]. Date of Service: 03/13/2019 2:45 PM Medical Record Number: [id number] Patient Account Number: [id number] Date of Birth/Sex: 26-Aug-1946 (73 y.o. F) Treating RN: Harold Barban Primary Care Jalen Daluz: [name] Other Clinician: Referring Lucciana Head: [name] Treating Ahana Najera/Extender: Melburn Hake, HOYT Weeks in Treatment: 11 Visit Information History Since Last Visit Added or deleted any medications: No Patient Arrived: Ambulatory Any new allergies or adverse reactions: No Arrival Time: 15:05 Had a fall or experienced change in No Accompanied By: self activities of daily living that may affect Transfer Assistance: None risk of falls: Patient Identification Verified: Yes Signs or symptoms of abuse/neglect since last visito No Secondary Verification Process Completed: Yes Hospitalized since last visit: No Has Dressing in Place as Prescribed: Yes Has Compression in Place as Prescribed: Yes Pain Present Now: Yes Electronic Signature(s) Signed: 03/13/2019 3:56:24 PM By: Harold Barban Entered By: Harold Barban on 03/13/2019 15:05:20 [name], [name] ([id number]) -------------------------------------------------------------------------------- Compression Therapy Details Patient Name: [name], [name]. Date of Service: 03/13/2019 2:45 PM Medical Record Number: [id number] Patient Account Number: [id number] Date of Birth/Sex: 12-02-1945 (74 y.o. F) Treating RN: Harold Barban Primary Care Izella Ybanez: [name] Other Clinician: Referring Tiarra Anastacio: [name] Treating Shaquala Broeker/Extender: STONE III, HOYT Weeks in Treatment: 13 Compression Therapy Performed for Wound Assessment: Wound #5 Left,Medial Lower Leg Performed By: Clinician Harold Barban, RN Compression Type: Four Layer Electronic Signature(s) Signed: 03/13/2019 3:56:24 PM By: Harold Barban Entered By: Harold Barban on 03/13/2019 15:32:55 [name], [name] ([id number]) -------------------------------------------------------------------------------- Encounter Discharge Information Details Patient Name: [name], [name]. Date of Service: 03/13/2019 2:45 PM Medical Record Number: [id number] Patient Account Number: [id number] Date of Birth/Sex: [birth year] (73 y.o. F) Treating RN: Harold Barban Primary Care Tambria Pfannenstiel: [name] Other Clinician: Referring Colston Pyle: [name] Treating Natalio Salois/Extender: Melburn Hake, HOYT Weeks in Treatment: 13 Encounter Discharge Information Items Discharge Condition: Stable Ambulatory Status: Ambulatory Discharge Destination: Home Transportation: Private Auto Accompanied By: self Schedule Follow-up Appointment: Yes Clinical Summary of Care: Electronic Signature(s) Signed: 03/13/2019 3:56:24 PM By: Harold Barban Entered By: Harold Barban on 03/13/2019 15:33:55 [name], [name] ([id number]) -------------------------------------------------------------------------------- Wound Assessment Details Patient Name: [name], [name]. Date of Service: 03/13/2019 2:45 PM Medical Record Number: [id number] Patient Account Number: [id number] Date of Birth/Sex: 08-Apr-1946 (73 y.o. F) Treating RN: Harold Barban Primary Care Nakeysha Pasqual: [name] Other Clinician: Referring Deylan Canterbury: [name] Treating Ellyson Rarick/Extender: STONE III, HOYT Weeks in Treatment: 13 Wound Status Wound Number: 5 Primary Lymphedema Etiology: Wound Location: Left Lower Leg - Medial Wound Open Wounding Event: Gradually Appeared Status: Date Acquired: 11/19/2018 Comorbid Cataracts, Asthma, Sleep Apnea, Deep Vein Weeks Of Treatment: 13 History: Thrombosis, Hypertension, Peripheral Venous Clustered Wound: No Disease, Osteoarthritis, Received Chemotherapy, Received Radiation Wound Measurements Length: (cm) 2.6 Width: (cm) 2.2 Depth: (cm)  0.4 Area: (cm) 4.492 Volume: (cm) 1.797 % Reduction in Area: 47.4% % Reduction in Volume: -110.2% Epithelialization: None Tunneling: No Undermining: No Wound Description Full Thickness Without Exposed Support Foul Od Classification: Structures Slough/ Wound Margin: Indistinct, nonvisible Exudate Medium Amount: Exudate Type: Serosanguineous Exudate Color: red, brown or After Cleansing: No Fibrino Yes Wound Bed Granulation Amount: Small (1-33%) Exposed Structure Granulation Quality: Red, Pink Fascia Exposed: No Necrotic Amount: Large (67-100%) Fat Layer (Subcutaneous Tissue) Exposed: Yes Necrotic Quality: Adherent Slough Tendon Exposed: No Muscle Exposed: No Joint Exposed: No Bone Exposed: No Periwound Skin Texture Texture Color No Abnormalities Noted: No No Abnormalities Noted: No Callus: No Atrophie Blanche: No Crepitus: No  Cyanosis: No Excoriation: No Ecchymosis: No Induration: No Erythema: No Rash: No Hemosiderin Staining: Yes Scarring: No Mottled: No Pallor: No Moisture Rubor: No No Abnormalities Noted: No [name], [name] ([id number]) Dry / Scaly: No Temperature / Pain Maceration: Yes Temperature: No Abnormality Tenderness on Palpation: Yes Treatment Notes Wound #5 (Left, Medial Lower Leg) Notes TCA, Sorbact, ABD, Unna to anchor, 4-Layer left Electronic Signature(s) Signed: 03/13/2019 3:56:24 PM By: Harold Barban Entered By: Harold Barban on 03/13/2019 15:30:19 [name], [name] ([id number]) -------------------------------------------------------------------------------- Wound Assessment Details Patient Name: [name], [name]. Date of Service: 03/13/2019 2:45 PM Medical Record Number: [id number] Patient Account Number: [id number] Date of Birth/Sex: [birth year] (73 y.o. F) Treating RN: Harold Barban Primary Care Genene Kilman: [name] Other Clinician: Referring Jishnu Jenniges: [name] Treating Corin Tilly/Extender: STONE III, HOYT Weeks  in Treatment: 13 Wound Status Wound Number: 6 Primary Venous Leg Ulcer Etiology: Wound Location: Left Lower Leg - Lateral Wound Open Wounding Event: Gradually Appeared Status: Date Acquired: 01/19/2019 Comorbid Cataracts, Asthma, Sleep Apnea, Deep Vein Weeks Of Treatment: 7 History: Thrombosis, Hypertension, Peripheral Venous Clustered Wound: No Disease, Osteoarthritis, Received Chemotherapy, Received Radiation Wound Measurements Length: (cm) 1.7 Width: (cm) 2.1 Depth: (cm) 0.3 Area: (cm) 2.804 Volume: (cm) 0.841 % Reduction in Area: -1174.5% % Reduction in Volume: -3722.7% Epithelialization: None Tunneling: No Undermining: No Wound Description Full Thickness Without Exposed Support Foul Odo Classification: Structures Slough/F Wound Margin: Flat and Intact Exudate Medium Amount: Exudate Type: Serous Exudate Color: amber r After Cleansing: No ibrino Yes Wound Bed Granulation Amount: None Present (0%) Exposed Structure Necrotic Amount: Large (67-100%) Fascia Exposed: No Necrotic Quality: Adherent Slough Fat Layer (Subcutaneous Tissue) Exposed: Yes Tendon Exposed: No Muscle Exposed: No Joint Exposed: No Bone Exposed: No Periwound Skin Texture Texture Color No Abnormalities Noted: No No Abnormalities Noted: No Callus: No Atrophie Blanche: No Crepitus: No Cyanosis: No Excoriation: No Ecchymosis: No Induration: No Erythema: No Rash: No Hemosiderin Staining: Yes Scarring: No Mottled: No Pallor: No Moisture Rubor: No No Abnormalities Noted: No LADEIDRA, BORYS ([id number]) Dry / Scaly: No Temperature / Pain Maceration: No Temperature: No Abnormality Tenderness on Palpation: Yes Treatment Notes Wound #6 (Left, Lateral Lower Leg) Notes TCA, Sorbact, ABD, Unna to anchor, 4-Layer left Electronic Signature(s) Signed: 03/13/2019 3:56:24 PM By: Harold Barban Entered By: Harold Barban on 03/13/2019 15:32:32

## 2019-03-18 ENCOUNTER — Other Ambulatory Visit: Payer: Self-pay

## 2019-03-18 ENCOUNTER — Encounter: Payer: Medicare Other | Admitting: Internal Medicine

## 2019-03-18 DIAGNOSIS — L97222 Non-pressure chronic ulcer of left calf with fat layer exposed: Secondary | ICD-10-CM | POA: Diagnosis not present

## 2019-03-18 DIAGNOSIS — I87332 Chronic venous hypertension (idiopathic) with ulcer and inflammation of left lower extremity: Secondary | ICD-10-CM | POA: Diagnosis not present

## 2019-03-18 DIAGNOSIS — L97221 Non-pressure chronic ulcer of left calf limited to breakdown of skin: Secondary | ICD-10-CM | POA: Diagnosis not present

## 2019-03-18 DIAGNOSIS — I87312 Chronic venous hypertension (idiopathic) with ulcer of left lower extremity: Secondary | ICD-10-CM | POA: Diagnosis not present

## 2019-03-18 DIAGNOSIS — I890 Lymphedema, not elsewhere classified: Secondary | ICD-10-CM | POA: Diagnosis not present

## 2019-03-19 ENCOUNTER — Ambulatory Visit: Payer: Medicare Other | Admitting: Allergy and Immunology

## 2019-03-19 NOTE — Progress Notes (Signed)
CHIRSTINE, DEFRAIN ([id number]) Visit Report for 03/18/2019 Arrival Information Details Patient Name: [name], [name]. Date of Service: 03/18/2019 12:30 PM Medical Record Number: [id number] Patient Account Number: [id number] Date of Birth/Sex: 1946/04/20 (73 y.o. F) Treating RN: Harold Barban Primary Care Daryon Remmert: [name] Other Clinician: Referring Sehar Sedano: [name] Treating Zakariya Knickerbocker/Extender: Tito Dine in Treatment: 14 Visit Information History Since Last Visit Added or deleted any medications: No Patient Arrived: Ambulatory Any new allergies or adverse reactions: No Arrival Time: 12:42 Had a fall or experienced change in No Accompanied By: self activities of daily living that may affect Transfer Assistance: None risk of falls: Patient Identification Verified: Yes Signs or symptoms of abuse/neglect since last visito No Secondary Verification Process Completed: Yes Hospitalized since last visit: No Has Dressing in Place as Prescribed: Yes Has Compression in Place as Prescribed: Yes Pain Present Now: Yes Electronic Signature(s) Signed: 03/18/2019 4:23:05 PM By: Harold Barban Entered By: Harold Barban on 03/18/2019 12:43:18 Melott, [name] ([id number]) -------------------------------------------------------------------------------- Encounter Discharge Information Details Patient Name: [name], [name]. Date of Service: 03/18/2019 12:30 PM Medical Record Number: [id number] Patient Account Number: [id number] Date of Birth/Sex: [birth year] (73 y.o. F) Treating RN: Harold Barban Primary Care Jasneet Schobert: [name] Other Clinician: Referring Jerris Fleer: [name] Treating Veryl Abril/Extender: Tito Dine in Treatment: 14 Encounter Discharge Information Items Post Procedure Vitals Discharge Condition: Stable Temperature (F): 97.6 Ambulatory Status: Ambulatory Pulse (bpm): 79 Discharge Destination: Home Respiratory Rate  (breaths/min): 18 Transportation: Private Auto Blood Pressure (mmHg): 161/80 Accompanied By: self Schedule Follow-up Appointment: Yes Clinical Summary of Care: Electronic Signature(s) Signed: 03/18/2019 2:10:10 PM By: Harold Barban Entered By: Harold Barban on 03/18/2019 14:10:10 Wedel, [name] ([id number]) -------------------------------------------------------------------------------- Lower Extremity Assessment Details Patient Name: [name], [name]. Date of Service: 03/18/2019 12:30 PM Medical Record Number: [id number] Patient Account Number: [id number] Date of Birth/Sex: 12/04/1945 (73 y.o. F) Treating RN: Harold Barban Primary Care Jaun Galluzzo: [name] Other Clinician: Referring Karcyn Menn: [name] Treating Kue Fox/Extender: Tito Dine in Treatment: 14 Edema Assessment Assessed: [Left: No] [Right: No] Edema: [Left: Ye] [Right: s] Calf Left: Right: Point of Measurement: 31 cm From Medial Instep 44 cm cm Ankle Left: Right: Point of Measurement: 13 cm From Medial Instep 24 cm cm Vascular Assessment Pulses: Dorsalis Pedis Palpable: [Left:Yes] Posterior Tibial Palpable: [Left:Yes] Electronic Signature(s) Signed: 03/18/2019 4:23:05 PM By: Harold Barban Entered By: Harold Barban on 03/18/2019 13:02:01 Fouts, [name] ([id number]) -------------------------------------------------------------------------------- Multi Wound Chart Details Patient Name: [name], [name]. Date of Service: 03/18/2019 12:30 PM Medical Record Number: [id number] Patient Account Number: [id number] Date of Birth/Sex: 07-12-1946 (73 y.o. F) Treating RN: Cornell Barman Primary Care Rashawna Scoles: [name] Other Clinician: Referring Ceyda Peterka: [name] Treating Coralie Stanke/Extender: Tito Dine in Treatment: 14 Vital Signs Height(in): 63 Pulse(bpm): 86 Weight(lbs): 224.7 Blood Pressure(mmHg): 161/80 Body Mass Index(BMI): 40 Temperature(F):  97.6 Respiratory Rate 18 (breaths/min): Photos: [N/A:N/A] Wound Location: Left Lower Leg - Medial Left Lower Leg - Lateral N/A Wounding Event: Gradually Appeared Gradually Appeared N/A Primary Etiology: Lymphedema Venous Leg Ulcer N/A Comorbid History: Cataracts, Asthma, Sleep Cataracts, Asthma, Sleep N/A Apnea, Deep Vein Apnea, Deep Vein Thrombosis, Hypertension, Thrombosis, Hypertension, Peripheral Venous Disease, Peripheral Venous Disease, Osteoarthritis, Received Osteoarthritis, Received Chemotherapy, Received Chemotherapy, Received Radiation Radiation Date Acquired: 11/19/2018 01/19/2019 N/A Weeks of Treatment: 14 8 N/A Wound Status: Open Open N/A Measurements L x W x D 2.5x2.2x0.4 1.5x2.6x0.3 N/A (cm) Area (cm) : 4.32 3.063 N/A Volume (cm) : 1.728 0.919 N/A % Reduction in Area:  49.40% -1292.30% N/A % Reduction in Volume: -102.10% -4077.30% N/A Classification: Full Thickness Without Full Thickness Without N/A Exposed Support Structures Exposed Support Structures Exudate Amount: Medium Medium N/A Exudate Type: Serosanguineous Serous N/A Exudate Color: red, brown amber N/A Wound Margin: Indistinct, nonvisible Flat and Intact N/A Granulation Amount: Small (1-33%) None Present (0%) N/A Granulation Quality: Red, Pink N/A N/A Necrotic Amount: Large (67-100%) Large (67-100%) N/A Exposed Structures: N/A [name], [name] ([id number]) Fat Layer (Subcutaneous Fat Layer (Subcutaneous Tissue) Exposed: Yes Tissue) Exposed: Yes Fascia: No Fascia: No Tendon: No Tendon: No Muscle: No Muscle: No Joint: No Joint: No Bone: No Bone: No Epithelialization: None None N/A Debridement: N/A Debridement - Excisional N/A Pre-procedure N/A 13:09 N/A Verification/Time Out Taken: Pain Control: N/A Lidocaine N/A Tissue Debrided: N/A Subcutaneous, Slough N/A Level: N/A Skin/Subcutaneous Tissue N/A Debridement Area (sq cm): N/A 3.9 N/A Instrument: N/A Curette N/A Bleeding: N/A Minimum  N/A Hemostasis Achieved: N/A Pressure N/A Debridement Treatment N/A Procedure was tolerated well N/A Response: Post Debridement N/A 1.5x2.6x0.4 N/A Measurements L x W x D (cm) Post Debridement Volume: N/A 1.225 N/A (cm) Periwound Skin Texture: Excoriation: No Excoriation: No N/A Induration: No Induration: No Callus: No Callus: No Crepitus: No Crepitus: No Rash: No Rash: No Scarring: No Scarring: No Periwound Skin Moisture: Maceration: Yes Maceration: No N/A Dry/Scaly: No Dry/Scaly: No Periwound Skin Color: Hemosiderin Staining: Yes Hemosiderin Staining: Yes N/A Atrophie Blanche: No Atrophie Blanche: No Cyanosis: No Cyanosis: No Ecchymosis: No Ecchymosis: No Erythema: No Erythema: No Mottled: No Mottled: No Pallor: No Pallor: No Rubor: No Rubor: No Temperature: No Abnormality No Abnormality N/A Tenderness on Palpation: Yes Yes N/A Procedures Performed: N/A Debridement N/A Treatment Notes Electronic Signature(s) Signed: 03/18/2019 4:30:12 PM By: Linton Ham MD Entered By: Linton Ham on 03/18/2019 13:18:55 Galgano, [name] ([id number]) -------------------------------------------------------------------------------- Multi-Disciplinary Care Plan Details Patient Name: [name], [name]. Date of Service: 03/18/2019 12:30 PM Medical Record Number: [id number] Patient Account Number: [id number] Date of Birth/Sex: 04-Sep-1946 (73 y.o. F) Treating RN: Cornell Barman Primary Care Edrik Rundle: [name] Other Clinician: Referring Brei Pociask: [name] Treating Rishit Burkhalter/Extender: Tito Dine in Treatment: 14 Active Inactive Orientation to the Wound Care Program Nursing Diagnoses: Knowledge deficit related to the wound healing center program Goals: Patient/caregiver will verbalize understanding of the [place] Program Date Initiated: 12/10/2018 Target Resolution Date: 01/09/2019 Goal Status: Active Interventions: Provide education  on orientation to the wound center Notes: Soft Tissue Infection Nursing Diagnoses: Impaired tissue integrity Goals: Patient's soft tissue infection will resolve Date Initiated: 12/10/2018 Target Resolution Date: 01/09/2019 Goal Status: Active Interventions: Assess signs and symptoms of infection every visit Notes: Venous Leg Ulcer Nursing Diagnoses: Actual venous Insuffiency (use after diagnosis is confirmed) Goals: Patient will maintain optimal edema control Date Initiated: 12/10/2018 Target Resolution Date: 01/09/2019 Goal Status: Active Interventions: Assess peripheral edema status every visit. [name], [name] ([id number]) Treatment Activities: Therapeutic compression applied : 12/10/2018 Notes: Wound/Skin Impairment Nursing Diagnoses: Impaired tissue integrity Goals: Patient/caregiver will verbalize understanding of skin care regimen Date Initiated: 12/10/2018 Target Resolution Date: 01/09/2019 Goal Status: Active Interventions: Assess ulceration(s) every visit Treatment Activities: Topical wound management initiated : 12/10/2018 Notes: Electronic Signature(s) Signed: 03/19/2019 10:03:11 AM By: [name], [name], RN, CWS, [name], [name] Entered By: [name], [name], RN, CWS, Kim on 03/18/2019 13:08:43 [name] ([id number]) -------------------------------------------------------------------------------- Pain Assessment Details Patient Name: [name], [name]. Date of Service: 03/18/2019 12:30 PM Medical Record Number: [id number] Patient Account Number: [id number] Date of Birth/Sex: [birth year] (73 y.o. F) Treating RN:  Harold Barban Primary Care Madelynn Malson: [name] Other Clinician: Referring Nacole Fluhr: [name] Treating Antonietta Lansdowne/Extender: Tito Dine in Treatment: 14 Active Problems Location of Pain Severity and Description of Pain Patient Has Paino Yes Site Locations Rate the pain. Current Pain Level: 6 Pain Management and Medication Current Pain  Management: Electronic Signature(s) Signed: 03/18/2019 4:23:05 PM By: Harold Barban Entered By: Harold Barban on 03/18/2019 12:43:27 Lamb, [name] ([id number]) -------------------------------------------------------------------------------- Patient/Caregiver Education Details Patient Name: [name], [name]. Date of Service: 03/18/2019 12:30 PM Medical Record Number: [id number] Patient Account Number: [id number] Date of Birth/Gender: May 18, 1946 (73 y.o. F) Treating RN: Cornell Barman Primary Care Physician: [name] Other Clinician: Referring Physician: [name] Treating Physician/Extender: Tito Dine in Treatment: 14 Education Assessment Education Provided To: Patient Education Topics Provided Wound Debridement: Handouts: Wound Debridement Methods: Demonstration, Explain/Verbal Responses: State content correctly Wound/Skin Impairment: Handouts: Caring for Your Ulcer Methods: Demonstration, Explain/Verbal Responses: State content correctly Electronic Signature(s) Signed: 03/19/2019 10:03:11 AM By: [name], [name], RN, CWS, [name], [name] Entered By: [name], [name], RN, CWS, Kim on 03/18/2019 13:14:16 [name] ([id number]) -------------------------------------------------------------------------------- Wound Assessment Details Patient Name: [name], [name]. Date of Service: 03/18/2019 12:30 PM Medical Record Number: [id number] Patient Account Number: [id number] Date of Birth/Sex: 1946-01-27 (73 y.o. F) Treating RN: Harold Barban Primary Care Tarun Patchell: [name] Other Clinician: Referring Omeed Osuna: [name] Treating Lilliemae Fruge/Extender: Tito Dine in Treatment: 14 Wound Status Wound Number: 5 Primary Lymphedema Etiology: Wound Location: Left Lower Leg - Medial Wound Open Wounding Event: Gradually Appeared Status: Date Acquired: 11/19/2018 Comorbid Cataracts, Asthma, Sleep Apnea, Deep Vein Weeks Of Treatment: 14 History:  Thrombosis, Hypertension, Peripheral Venous Clustered Wound: No Disease, Osteoarthritis, Received Chemotherapy, Received Radiation Photos Wound Measurements Length: (cm) 2.5 Width: (cm) 2.2 Depth: (cm) 0.4 Area: (cm) 4.32 Volume: (cm) 1.728 % Reduction in Area: 49.4% % Reduction in Volume: -102.1% Epithelialization: None Tunneling: No Undermining: No Wound Description Full Thickness Without Exposed Support Classification: Structures Wound Margin: Indistinct, nonvisible Exudate Medium Amount: Exudate Type: Serosanguineous Exudate Color: red, brown Foul Odor After Cleansing: No Slough/Fibrino Yes Wound Bed Granulation Amount: Small (1-33%) Exposed Structure Granulation Quality: Red, Pink Fascia Exposed: No Necrotic Amount: Large (67-100%) Fat Layer (Subcutaneous Tissue) Exposed: Yes Necrotic Quality: Adherent Slough Tendon Exposed: No Muscle Exposed: No Joint Exposed: No Bone Exposed: No Pettengill, Jeannifer J. ([id number]) Periwound Skin Texture Texture Color No Abnormalities Noted: No No Abnormalities Noted: No Callus: No Atrophie Blanche: No Crepitus: No Cyanosis: No Excoriation: No Ecchymosis: No Induration: No Erythema: No Rash: No Hemosiderin Staining: Yes Scarring: No Mottled: No Pallor: No Moisture Rubor: No No Abnormalities Noted: No Dry / Scaly: No Temperature / Pain Maceration: Yes Temperature: No Abnormality Tenderness on Palpation: Yes Treatment Notes Wound #5 (Left, Medial Lower Leg) Notes Sorbact, TCA peri wounds, ABX ointment to ingrown hair follicle (anterior lower leg), unna to anchor, ABD, 3-Layer Left Electronic Signature(s) Signed: 03/18/2019 4:23:05 PM By: Harold Barban Entered By: Harold Barban on 03/18/2019 12:59:32 Scearce, [name] ([id number]) -------------------------------------------------------------------------------- Wound Assessment Details Patient Name: [name], [name]. Date of Service: 03/18/2019 12:30  PM Medical Record Number: [id number] Patient Account Number: [id number] Date of Birth/Sex: [birth year] (73 y.o. F) Treating RN: Harold Barban Primary Care Kweku Stankey: [name] Other Clinician: Referring Jay Kempe: [name] Treating [name] Broaden/Extender: Tito Dine in Treatment: 14 Wound Status Wound Number: 6 Primary Venous Leg Ulcer Etiology: Wound Location: Left Lower Leg - Lateral Wound Open Wounding Event: Gradually Appeared Status: Date Acquired: 01/19/2019 Comorbid Cataracts,  Asthma, Sleep Apnea, Deep Vein Weeks Of Treatment: 8 History: Thrombosis, Hypertension, Peripheral Venous Clustered Wound: No Disease, Osteoarthritis, Received Chemotherapy, Received Radiation Photos Wound Measurements Length: (cm) 1.5 Width: (cm) 2.6 Depth: (cm) 0.3 Area: (cm) 3.063 Volume: (cm) 0.919 % Reduction in Area: -1292.3% % Reduction in Volume: -4077.3% Epithelialization: None Tunneling: No Undermining: No Wound Description Full Thickness Without Exposed Support Foul O Classification: Structures Slough Wound Margin: Flat and Intact Exudate Medium Amount: Exudate Type: Serous Exudate Color: amber dor After Cleansing: No /Fibrino Yes Wound Bed Granulation Amount: None Present (0%) Exposed Structure Necrotic Amount: Large (67-100%) Fascia Exposed: No Necrotic Quality: Adherent Slough Fat Layer (Subcutaneous Tissue) Exposed: Yes Tendon Exposed: No Muscle Exposed: No Joint Exposed: No Bone Exposed: No Sabol, Steffi J. ([id number]) Periwound Skin Texture Texture Color No Abnormalities Noted: No No Abnormalities Noted: No Callus: No Atrophie Blanche: No Crepitus: No Cyanosis: No Excoriation: No Ecchymosis: No Induration: No Erythema: No Rash: No Hemosiderin Staining: Yes Scarring: No Mottled: No Pallor: No Moisture Rubor: No No Abnormalities Noted: No Dry / Scaly: No Temperature / Pain Maceration: No Temperature: No  Abnormality Tenderness on Palpation: Yes Treatment Notes Wound #6 (Left, Lateral Lower Leg) Notes Sorbact, TCA peri wounds, ABX ointment to ingrown hair follicle (anterior lower leg), unna to anchor, ABD, 3-Layer Left Electronic Signature(s) Signed: 03/18/2019 4:23:05 PM By: Harold Barban Entered By: Harold Barban on 03/18/2019 12:59:55 Ciaravino, [name] ([id number]) -------------------------------------------------------------------------------- Vitals Details Patient Name: QUIARA, KILLIAN. Date of Service: 03/18/2019 12:30 PM Medical Record Number: [id number] Patient Account Number: [id number] Date of Birth/Sex: [birth year] (73 y.o. F) Treating RN: Harold Barban Primary Care Zarai Orsborn: [name] Other Clinician: Referring Blossom Crume: [name] Treating Mychael Smock/Extender: Tito Dine in Treatment: 14 Vital Signs Time Taken: 12:40 Temperature (F): 97.6 Height (in): 63 Pulse (bpm): 79 Weight (lbs): 224.7 Respiratory Rate (breaths/min): 18 Body Mass Index (BMI): 39.8 Blood Pressure (mmHg): 161/80 Reference Range: 80 - 120 mg / dl Electronic Signature(s) Signed: 03/18/2019 4:23:05 PM By: Harold Barban Entered By: Harold Barban on 03/18/2019 12:47:01

## 2019-03-19 NOTE — Progress Notes (Signed)
[name], [name] ([id number]) Visit Report for 03/18/2019 Debridement Details Patient Name: [name], [name]. Date of Service: 03/18/2019 12:30 PM Medical Record Number: [id number] Patient Account Number: [id number] Date of Birth/Sex: Oct 07, 1946 (73 y.o. F) Treating RN: Cornell Barman Primary Care Provider: [name] Other Clinician: Referring Provider: [name] Treating Provider/Extender: Tito Dine in Treatment: 14 Debridement Performed for Wound #6 Left,Lateral Lower Leg Assessment: Performed By: Physician [name] Dillon, MD Debridement Type: Debridement Severity of Tissue Pre Fat layer exposed Debridement: Level of Consciousness (Pre- Awake and Alert procedure): Pre-procedure Verification/Time Yes - 13:09 Out Taken: Start Time: 13:09 Pain Control: Lidocaine Total Area Debrided (L x W): 1.5 (cm) x 2.6 (cm) = 3.9 (cm) Tissue and other material Viable, Non-Viable, Slough, Subcutaneous, Slough debrided: Level: Skin/Subcutaneous Tissue Debridement Description: Excisional Instrument: Curette Bleeding: Minimum Hemostasis Achieved: Pressure End Time: 13:09 Response to Treatment: Procedure was tolerated well Level of Consciousness Awake and Alert (Post-procedure): Post Debridement Measurements of Total Wound Length: (cm) 1.5 Width: (cm) 2.6 Depth: (cm) 0.4 Volume: (cm) 1.225 Character of Wound/Ulcer Post Debridement: Requires Further Debridement Severity of Tissue Post Debridement: Fat layer exposed Post Procedure Diagnosis Same as Pre-procedure Electronic Signature(s) Signed: 03/18/2019 4:30:12 PM By: Linton Ham MD Signed: 03/19/2019 10:03:11 AM By: [name], [name], RN, CWS, [name], [name] Entered By: Linton Ham on 03/18/2019 13:19:06 Wilczak, [name] ([id number]) -------------------------------------------------------------------------------- HPI Details Patient Name: [name], [name]. Date of Service: 03/18/2019 12:30 PM Medical Record  Number: [id number] Patient Account Number: [id number] Date of Birth/Sex: [birth year] (73 y.o. F) Treating RN: Cornell Barman Primary Care Provider: [name] Other Clinician: Referring Provider: [name] Treating Provider/Extender: Tito Dine in Treatment: 14 History of Present Illness HPI Description: Pleasant 73 year old with history of chronic venous insufficiency. No diabetes or peripheral vascular disease. Left ABI 1.29. Questionable history of left lower extremity DVT. She developed a recurrent ulceration on her left lateral calf in December 2015, which she attributes to poor diet and subsequent lower extremity edema. She underwent endovenous laser ablation of her left greater saphenous vein in 2010. She underwent laser ablation of accessory branch of left GSV in April 2016 by Dr. Kellie Simmering at [facility]. She was previously wearing Unna boots, which she tolerated well. Tolerating 2 layer compression and cadexomer iodine. She returns to clinic for follow-up and is without new complaints. She denies any significant pain at this time. She reports persistent pain with pressure. No claudication or ischemic rest pain. No fever or chills. No drainage. READMISSION 11/13/16; this is a 73 year old woman who is not a diabetic. She is here for a review of a painful area on her left medial lower extremity. I note that she was seen here previously last year for wound I believe to be in the same area. At that time she had undergone previously a left greater saphenous vein ablation by Dr. Kellie Simmering and she had a ablation of the anterior accessory branch of the left greater saphenous vein in March 2016. Seeing that the wound actually closed over. In reviewing the history with her today the ulcer in this area has been recurrent. She describes a biopsy of this area in 2009 that only showed stasis physiology. She also has a history of today malignant melanoma in the right shoulder for which  she follows with Dr. 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 Do not see that  she has ever had arterial studies in the left leg. She tells me she has a follow-up with Dr. Kellie Simmering in roughly 10 days In any case she developed the reopening of this area roughly a month ago. On the background of this she describes rapidly increasing edema which has responded to Lasix 40 mg and metolazone 2.5 mg as well as the patient's lymph massage. She has been told she has both venous insufficiency and lymphedema but she cannot tolerate compression stockings 11/28/16; the patient saw Dr. Kellie Simmering recently. Per the patient he did arterial Dopplers in the office that did not show evidence of arterial insufficiency, per the patient he stated "treat this like an ordinary venous ulcer". She also saw her dermatologist Dr. Ronnald Ramp who felt that this was more of a vascular ulcer. In general things are improving although she arrives today with increasing bilateral lower extremity edema with weeping a deeper fluid through the wound on the left medial leg compatible with some degree of lymphedema 12/04/16; the patient's wound is fully epithelialized but I don't think fully healed. We will do another week of depression with Promogran and TCA however I suspect we'll be able to discharge her next week. This is a very unusual-looking wound which was initially a figure-of-eight type wound lying on its side surrounded by petechial like hemorrhage. She has had venous ablation on this side. She apparently does not have an arterial issue per Dr. Kellie Simmering. She saw her dermatologist thought it was "vascular". Patient is definitely going to need ongoing compression and I talked about this with her today she will go to elastic therapy after she leaves here next week 12/11/16; the patient's wound is not completely closed today. She  has surrounding scar tissue and in further discussion with the patient it would appear that she had ulcers in this area in 2009 for a prolonged period of time ultimately requiring a punch biopsy of this area that only showed venous insufficiency. I did not previously pickup on this part of the history from the patient. 12/18/16; the patient's wound is completely epithelialized. There is no open area here. She has significant bilateral venous insufficiency with secondary lymphedema to a mild-to-moderate degree she does not have compression stockings.. She did not say anything to me when I was in the room, she told our intake nurse that she was still having pain in this area. This isn't unusual recurrent small open area. She is going to go to elastic therapy to obtain compression stockings. 12/25/16; the patient's wound is fully epithelialized. There is no open area here. The patient describes some continued episodic discomfort in this area medial left calf. However everything looks fine and healed here. She is been to elastic therapy and caught herself 15-20 mmHg stockings, they apparently were having trouble getting 20-30 mm stockings in her size CANDIS, [name] ([id number]) 01/22/17; this is a patient we discharged from the clinic a month ago. She has a recurrent open wound on her medial left calf. She had 15 mm support stockings. I told her I thought she needed 20-30 mm compression stockings. She tells me that she has been ill with hospitalization secondary to asthma and is been found to have severe hypokalemia likely secondary to a combination of Lasix and metolazone. This morning she noted blistering and leaking fluid on the posterior part of her left leg. She called our intake nurse urgently and we was saw her this afternoon. She has not had any real discomfort here. I don't know  that she's been wearing any stockings on this leg for at least 2-3 days. ABIs in this clinic were 1.21 on the right and  1.3 on the left. She is previously seen vascular surgery who does not think that there is a peripheral arterial issue. 01/30/17; Patient arrives with no open wound on the left leg. She has been to elastic therapy and obtained 20-78mmhg below knee stockings and she has one on the right leg today. READMISSION 02/19/18; this Polasek is a now 73 year old patient we've had in this clinic perhaps 3 times before. I had last looked at her from January 07 December 2016 with an area on the medial left leg. We discharged her on 12/25/16 however she had to be readmitted on 01/22/17 with a recurrence. I have in my notes that we discharged her on 20-30 mm stockings although she tells me she was only wearing support hose because she cannot get stockings on predominantly related to her cervical spine surgery/issues. She has had previous ablations done by vein and vascular in [place] including a great saphenous vein ablation on the left with an anterior accessory branch ablation I think both of these were in 2016. On one of the previous visit she had a biopsy noted 2009 that was negative. She is not felt to have an arterial issue. She is not a diabetic. She does have a history of obstructive sleep apnea hypertension asthma as well as chronic venous insufficiency and lymphedema. On this occasion she noted 2 dry scaly patch on her left leg. She tried to put lotion on this it didn't really help. There were 2 open areas.the patient has been seeing her primary physician from 02/05/18 through 02/14/18. She had Unna boots applied. The superior wound now on the lateral left leg has closed but she's had one wound that remains open on the lateral left leg. This is not the same spot as we dealt with in 2018. ABIs in this clinic were 1.3 bilaterally 02/26/18; patient has a small wound on the left lateral calf. Dimensions are down. She has chronic venous insufficiency and lymphedema. 03/05/18; small open area on the left lateral calf.  Dimensions are down. Tightly adherent necrotic debris over the surface of the wound which was difficult to remove. Also the dressing [over collagen] stuck to the wound surface. This was removed with some difficulty as well. Change the primary dressing to Hydrofera Blue ready 03/12/18; small open area on the left lateral calf. Comes in with tightly adherent surface eschar as well as some adherent Hydrofera Blue. 03/19/18; open area on the left lateral calf. Again adherent surface eschar as well as some adherent Hydrofera Blue nonviable subcutaneous tissue. She complained of pain all week even with the reduction from 4-3 layer compression I put on last week. Also she had an increase in her ankle and calf measurements probably related to the same thing. 03/26/18; open area on the left lateral calf. A very small open area remains here. We used silver alginate starting last week as the Hydrofera Blue seem to stick to the wound bed. In using 4-layer compression 04/02/18; the open area in the left lateral calf at some adherent slough which I removed there is no open area here. We are able to transition her into her own compression stocking. Truthfully I think this is probably his support hose. However this does not maintain skin integrity will be limited. She cannot put over the toe compression stockings on because of neck problems hand problems etc. She  is allergic to the lining layer of juxta lites. We might be forced to use extremitease stocking should this fail READMIT 11/24/2018 Patient is now a 73 year old woman who is not a diabetic. She has been in this clinic on at least 3 previous occasions largely with recurrent wounds on her left leg secondary to chronic venous insufficiency with secondary lymphedema. Her situation is complicated by inability to get stockings on and an allergy to neoprene which is apparently a component and at least juxta lites and other stockings. As a result she really has not  been wearing any stockings on her legs. She tells [place] that roughly 2 or 3 weeks ago she started noticing a stinging sensation just above her ankle on the left medial aspect. She has been diagnosed with pseudogout and she wondered whether this was what she was experiencing. She tried to dress this with something she bought at the store however subsequently it pulled skin off and now she has an open wound that is not improving. She has been using Vaseline gauze with a cover bandage. She saw her primary doctor last week who put an Haematologist on her. ABIs in this clinic was 1.03 on the left MORELIA, CASSELLS. ([id number]) 2/12; the area is on the left medial ankle. Odd-looking wound with what looks to be surface epithelialization but a multitude of small petechial openings. This clearly not closed yet. We have been using silver alginate under 3 layer compression with TCA 2/19; the wound area did not look quite as good this week. Necrotic debris over the majority of the wound surface which required debridement. She continues to have a multitude of what looked to be small petechial openings. She reminds [place] that she had a biopsy on this initially during her first outbreak in 2015 in [place] [facility]. She expresses concern about this being a possible melanoma. 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 I assured her that this does not look anything like melanoma. Except for the petechial reaction it does look like a venous insufficiency area and she certainly has evidence of this on both sides 2/26; a difficult area on the left medial ankle. The patient clearly has chronic venous hypertension with some degree of lymphedema. The odd thing about the area is the small petechial hemorrhages. I am not really sure how to explain this. This was present last time and this is not a compression injury. We have been using  Hydrofera Blue which I changed to last week 3/4; still using Hydrofera Blue. Aggressive debridement today. She does not have known arterial issues. She has seen Dr. Kellie Simmering at [facility] vein [facility] vascular and and has an ablation on the left. [Anterior accessory branch of the greater saphenous]. From what I remember they did not feel she had an arterial issue. The patient has had this area biopsied in 2009 at [facility] dermatology and by her recollection they said this was "stasis". She is also follow-up with dermatology locally who thought that this was more of a vascular issue 3/11; using Hydrofera Blue. Aggressive debridement today. She does not have an arterial issue. We are using 3 layer compression although we may need to go to 4. The patient has been in for multiple changes to her wrap since I last saw her a week ago. She says that the area was leaking. I do not have too much more information on what was found 01/19/19 on  evaluation today patient was actually being seen for a nurse visit when unfortunately she had the area on her left lateral lower extremity as well as weeping from the right lower extremity that became apparent. Therefore we did end up actually seeing her for a full visit with myself. She is having some pain at this site as well but fortunately nothing too significant at this point. No fevers, chills, nausea, or vomiting noted at this time. 3/18-Patient is back to the clinic with the left leg venous leg ulcer, the ulcer is larger in size, has a surface that is densely adherent with fibrinous tissue, the Hydrofera Blue was used but is densely adherent and there was difficulty in removing it. The right lower extremity was also wrapped for weeping edema. Patient has a new area over the left lateral foot above the malleolus that is small and appears to have no debris with intact surrounding skin. Patient is on increased dose of Lasix also as a means to edema management 3/25; the  patient has a nonhealing venous ulcer on the medial left leg and last week developed a smaller area on the lateral left calf. We have been using Hydrofera Blue with a contact layer. 4/1; no major change in these wounds areas. Left medial and more recently left lateral calf. I tried Iodoflex last week to aid in debridement she did not tolerate this. She stated her pain was terrible all week. She took the top layer of the 4 layer compression off. 4/8; the patient actually looks somewhat better in terms of her more prominent left lateral calf wound. There is some healthy looking tissue here. She is still complaining of a lot of discomfort. 4/15; patient in a lot of pain secondary to sciatica. She is on a prednisone taper prescribed by her primary physician. She has the 2 areas one on the left medial and more recently a smaller area on the left lateral calf. Both of these just above the malleoli 4/22; her back pain is better but she still states she is very uncomfortable and now feels she is intolerant to the [employer]. No real change in the wounds we have been using Sorbact. She has been previously intolerant to Iodoflex. There is not a lot of option about what we can use to debride this wound under compression that she no doubt needs. sHe states Ultram no longer works for her pain 4/29; no major change in the wounds slightly increased depth. Surface on the original medial wound perhaps somewhat improved however the more recent area on the lateral left ankle is 100% covered in very adherent debris we have been using Sorbact. She tolerates 4 layer compression well and her edema control is a lot better. She has not had to come in for a nurse check 5/6; no major change in the condition of the wounds. She did consent to debridement today which was done with some difficulty. Continuing Sorbact. She did not tolerate Iodoflex. She was in for a check of her compression the day after we wrapped her last week  this was adjusted but nothing much was found 5/13; no major change in the condition or area of the wounds. I was able to get a fairly aggressive debridement done on the lateral left leg wound. Even using Sorbact under compression. She came back in on Friday to have the wrap changed. She says she felt uncomfortable on the lateral aspect of her ankle. She has a long history of chronic venous insufficiency including  previous ablation surgery on this side. Electronic Signature(s) Signed: 03/18/2019 4:30:12 PM By: Linton Ham MD Entered By: Linton Ham on 03/18/2019 13:20:56 NZINGA, FERRAN ([id number]) CARIE, KAPUSCINSKI ([id number]) -------------------------------------------------------------------------------- Physical Exam Details Patient Name: PERLITA, FORBUSH. Date of Service: 03/18/2019 12:30 PM Medical Record Number: [id number] Patient Account Number: [id number] Date of Birth/Sex: October 04, 1946 (73 y.o. F) Treating RN: Cornell Barman Primary Care Provider: [name] Other Clinician: Referring Provider: [name] Treating Provider/Extender: Tito Dine in Treatment: 14 Constitutional Patient is hypertensive.. Pulse regular and within target range for patient.Marland Kitchen Respirations regular, non-labored and within target range.. Temperature is normal and within the target range for the patient.Marland Kitchen appears in no distress. Notes Wound exam; left medial calf and left lateral calf. We are beginning to see more viable tissue on the medial wound. I was able to do a debridement of the lateral wound but there is still no viable surface on either side. Electronic Signature(s) Signed: 03/18/2019 4:30:12 PM By: Linton Ham MD Entered By: Linton Ham on 03/18/2019 13:21:51 Scovill, [name] ([id number]) -------------------------------------------------------------------------------- Physician Orders Details Patient Name: JENEAL, VOGL. Date of Service: 03/18/2019 12:30  PM Medical Record Number: [id number] Patient Account Number: [id number] Date of Birth/Sex: 09-12-1946 (72 y.o. F) Treating RN: Cornell Barman Primary Care Provider: [name] Other Clinician: Referring Provider: [name] Treating Provider/Extender: Tito Dine in Treatment: 14 Verbal / Phone Orders: No Diagnosis Coding Wound Cleansing Wound #5 Left,Medial Lower Leg o Clean wound with Normal Saline. o May Shower, gently pat wound dry prior to applying new dressing. Wound #6 Left,Lateral Lower Leg o Clean wound with Normal Saline. o May Shower, gently pat wound dry prior to applying new dressing. Anesthetic (add to Medication List) Wound #5 Left,Medial Lower Leg o Topical Lidocaine 4% cream applied to wound bed prior to debridement (In Clinic Only). Wound #6 Left,Lateral Lower Leg o Topical Lidocaine 4% cream applied to wound bed prior to debridement (In Clinic Only). Skin Barriers/Peri-Wound Care Wound #6 Left,Lateral Lower Leg o Other: - Antibiotic ointment on irritated areas Primary Wound Dressing Wound #5 Left,Medial Lower Leg o Other: - Sorbact Wound #6 Left,Lateral Lower Leg o Other: - Sorbact Secondary Dressing Wound #5 Left,Medial Lower Leg o ABD pad Wound #6 Left,Lateral Lower Leg o ABD pad Dressing Change Frequency Wound #5 Left,Medial Lower Leg o Change dressing every week Wound #6 Left,Lateral Lower Leg o Change dressing every week Follow-up Appointments ALDORA, PERMAN ([id number]) Wound #5 Left,Medial Lower Leg o Return Appointment in 1 week. o Nurse Visit as needed Wound #6 Left,Lateral Lower Leg o Return Appointment in 1 week. o Nurse Visit as needed Edema Control Wound #5 Left,Medial Lower Leg o 3 Layer Compression System - Left Lower Extremity Wound #6 Left,Lateral Lower Leg o 3 Layer Compression System - Left Lower Extremity Off-Loading Wound #5 Left,Medial Lower Leg o Other: -  Elevate legs as needed Wound #6 Left,Lateral Lower Leg o Other: - Elevate legs as needed Electronic Signature(s) Signed: 03/18/2019 4:30:12 PM By: Linton Ham MD Signed: 03/19/2019 10:03:11 AM By: [name], [name], RN, CWS, [name], [name] Entered By: [name], [name], RN, CWS, Kim on 03/18/2019 13:13:20 VEIDA, SPIRA ([id number]) -------------------------------------------------------------------------------- Problem List Details Patient Name: JINNA, WEINMAN. Date of Service: 03/18/2019 12:30 PM Medical Record Number: [id number] Patient Account Number: [id number] Date of Birth/Sex: Feb 16, 1946 (73 y.o. F) Treating RN: Cornell Barman Primary Care Provider: [name] Other Clinician: Referring Provider: [name] Treating Provider/Extender: [name] Dillon Weeks in Treatment: 14  Active Problems ICD-10 Evaluated Encounter Code Description Active Date Today Diagnosis L97.221 Non-pressure chronic ulcer of left calf limited to breakdown of 01/07/2019 No Yes skin I87.321 Chronic venous hypertension (idiopathic) with inflammation of 12/10/2018 No Yes right lower extremity I89.0 Lymphedema, not elsewhere classified 12/10/2018 No Yes Inactive Problems Resolved Problems Electronic Signature(s) Signed: 03/18/2019 4:30:12 PM By: Linton Ham MD Entered By: Linton Ham on 03/18/2019 13:18:45 Schimming, [name] ([id number]) -------------------------------------------------------------------------------- Progress Note Details Patient Name: AFIFA, TRUAX. Date of Service: 03/18/2019 12:30 PM Medical Record Number: [id number] Patient Account Number: [id number] Date of Birth/Sex: 09-18-1946 (73 y.o. F) Treating RN: Cornell Barman Primary Care Provider: [name] Other Clinician: Referring Provider: [name] Treating Provider/Extender: Tito Dine in Treatment: 14 Subjective History of Present Illness (HPI) Pleasant 73 year old with history of chronic venous  insufficiency. No diabetes or peripheral vascular disease. Left ABI 1.29. Questionable history of left lower extremity DVT. She developed a recurrent ulceration on her left lateral calf in December 2015, which she attributes to poor diet and subsequent lower extremity edema. She underwent endovenous laser ablation of her left greater saphenous vein in 2010. She underwent laser ablation of accessory branch of left GSV in April 2016 by Dr. Kellie Simmering at [facility]. She was previously wearing Unna boots, which she tolerated well. Tolerating 2 layer compression and cadexomer iodine. She returns to clinic for follow-up and is without new complaints. She denies any significant pain at this time. She reports persistent pain with pressure. No claudication or ischemic rest pain. No fever or chills. No drainage. READMISSION 11/13/16; this is a 73 year old woman who is not a diabetic. She is here for a review of a painful area on her left medial lower extremity. I note that she was seen here previously last year for wound I believe to be in the same area. At that time she had undergone previously a left greater saphenous vein ablation by Dr. Kellie Simmering and she had a ablation of the anterior accessory branch of the left greater saphenous vein in March 2016. Seeing that the wound actually closed over. In reviewing the history with her today the ulcer in this area has been recurrent. She describes a biopsy of this area in 2009 that only showed stasis physiology. 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 Do not see that she has ever had arterial studies in the left leg. She tells me she has a follow-up with Dr. Kellie Simmering in roughly 10 days In any case she developed the reopening of this area roughly a month  ago. On the background of this she describes rapidly increasing edema which has responded to Lasix 40 mg and metolazone 2.5 mg as well as the patient's lymph massage. She has been told she has both venous insufficiency and lymphedema but she cannot tolerate compression stockings 11/28/16; the patient saw Dr. Kellie Simmering recently. Per the patient he did arterial Dopplers in the office that did not show evidence of arterial insufficiency, per the patient he stated "treat this like an ordinary venous ulcer". She also saw her dermatologist Dr. Ronnald Ramp who felt that this was more of a vascular ulcer. In general things are improving although she arrives today with increasing bilateral lower extremity edema with weeping a deeper fluid through the wound on  the left medial leg compatible with some degree of lymphedema 12/04/16; the patient's wound is fully epithelialized but I don't think fully healed. We will do another week of depression with Promogran and TCA however I suspect we'll be able to discharge her next week. This is a very unusual-looking wound which was initially a figure-of-eight type wound lying on its side surrounded by petechial like hemorrhage. She has had venous ablation on this side. She apparently does not have an arterial issue per Dr. Kellie Simmering. She saw her dermatologist thought it was "vascular". Patient is definitely going to need ongoing compression and I talked about this with her today she will go to elastic therapy after she leaves here next week 12/11/16; the patient's wound is not completely closed today. She has surrounding scar tissue and in further discussion with the patient it would appear that she had ulcers in this area in 2009 for a prolonged period of time ultimately requiring a punch biopsy of this area that only showed venous insufficiency. I did not previously pickup on this part of the history from the patient. 12/18/16; the patient's wound is completely epithelialized. There  is no open area here. She has significant bilateral venous insufficiency with secondary lymphedema to a mild-to-moderate degree she does not have compression stockings.. She did not say anything to me when I was in the room, she told our intake nurse that she was still having pain in this area. This isn't unusual recurrent small open area. She is going to go to elastic therapy to obtain compression stockings. 12/25/16; the patient's wound is fully epithelialized. There is no open area here. The patient describes some continued episodic discomfort in this area medial left calf. However everything looks fine and healed here. She is been to elastic therapy and MARISAL, SWAREY ([id number]) caught herself 15-20 mmHg stockings, they apparently were having trouble getting 20-30 mm stockings in her size 01/22/17; this is a patient we discharged from the clinic a month ago. She has a recurrent open wound on her medial left calf. She had 15 mm support stockings. I told her I thought she needed 20-30 mm compression stockings. She tells me that she has been ill with hospitalization secondary to asthma and is been found to have severe hypokalemia likely secondary to a combination of Lasix and metolazone. This morning she noted blistering and leaking fluid on the posterior part of her left leg. She called our intake nurse urgently and we was saw her this afternoon. She has not had any real discomfort here. I don't know that she's been wearing any stockings on this leg for at least 2-3 days. ABIs in this clinic were 1.21 on the right and 1.3 on the left. She is previously seen vascular surgery who does not think that there is a peripheral arterial issue. 01/30/17; Patient arrives with no open wound on the left leg. She has been to elastic therapy and obtained 20-59mmhg below knee stockings and she has one on the right leg today. READMISSION 02/19/18; this Varone is a now 73 year old patient we've had in this clinic  perhaps 3 times before. I had last looked at her from January 07 December 2016 with an area on the medial left leg. We discharged her on 12/25/16 however she had to be readmitted on 01/22/17 with a recurrence. I have in my notes that we discharged her on 20-30 mm stockings although she tells me she was only wearing support hose because she cannot get stockings on predominantly  related to her cervical spine surgery/issues. She has had previous ablations done by vein and vascular in [place] including a great saphenous vein ablation on the left with an anterior accessory branch ablation I think both of these were in 2016. On one of the previous visit she had a biopsy noted 2009 that was negative. She is not felt to have an arterial issue. She is not a diabetic. She does have a history of obstructive sleep apnea hypertension asthma as well as chronic venous insufficiency and lymphedema. On this occasion she noted 2 dry scaly patch on her left leg. She tried to put lotion on this it didn't really help. There were 2 open areas.the patient has been seeing her primary physician from 02/05/18 through 02/14/18. She had Unna boots applied. The superior wound now on the lateral left leg has closed but she's had one wound that remains open on the lateral left leg. This is not the same spot as we dealt with in 2018. ABIs in this clinic were 1.3 bilaterally 02/26/18; patient has a small wound on the left lateral calf. Dimensions are down. She has chronic venous insufficiency and lymphedema. 03/05/18; small open area on the left lateral calf. Dimensions are down. Tightly adherent necrotic debris over the surface of the wound which was difficult to remove. Also the dressing [over collagen] stuck to the wound surface. This was removed with some difficulty as well. Change the primary dressing to Hydrofera Blue ready 03/12/18; small open area on the left lateral calf. Comes in with tightly adherent surface eschar as well as  some adherent Hydrofera Blue. 03/19/18; open area on the left lateral calf. Again adherent surface eschar as well as some adherent Hydrofera Blue nonviable subcutaneous tissue. She complained of pain all week even with the reduction from 4-3 layer compression I put on last week. Also she had an increase in her ankle and calf measurements probably related to the same thing. 03/26/18; open area on the left lateral calf. A very small open area remains here. We used silver alginate starting last week as the Hydrofera Blue seem to stick to the wound bed. In using 4-layer compression 04/02/18; the open area in the left lateral calf at some adherent slough which I removed there is no open area here. We are able to transition her into her own compression stocking. Truthfully I think this is probably his support hose. However this does not maintain skin integrity will be limited. She cannot put over the toe compression stockings on because of neck problems hand problems etc. She is allergic to the lining layer of juxta lites. We might be forced to use extremitease stocking should this fail READMIT 11/24/2018 Patient is now a 73 year old woman who is not a diabetic. She has been in this clinic on at least 3 previous occasions largely with recurrent wounds on her left leg secondary to chronic venous insufficiency with secondary lymphedema. Her situation is complicated by inability to get stockings on and an allergy to neoprene which is apparently a component and at least juxta lites and other stockings. As a result she really has not been wearing any stockings on her legs. She tells [place] that roughly 2 or 3 weeks ago she started noticing a stinging sensation just above her ankle on the left medial aspect. She has been diagnosed with pseudogout and she wondered whether this was what she was experiencing. She tried to dress this with something she bought at the store however subsequently it pulled skin  off and now  she has an open wound that is not improving. She has been using Vaseline gauze with a cover bandage. She saw her primary doctor last week who put an Haematologist on her. ABIs in this clinic was 1.03 on the left WEALTHY, DANIELSKI. ([id number]) 2/12; the area is on the left medial ankle. Odd-looking wound with what looks to be surface epithelialization but a multitude of small petechial openings. This clearly not closed yet. We have been using silver alginate under 3 layer compression with TCA 2/19; the wound area did not look quite as good this week. Necrotic debris over the majority of the wound surface which required debridement. She continues to have a multitude of what looked to be small petechial openings. She reminds [place] that she had a biopsy on this initially during her first outbreak in 2015 in [place] [facility]. She expresses concern about this being a possible melanoma. 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 I assured her that this does not look anything like melanoma. Except for the petechial reaction it does look like a venous insufficiency area and she certainly has evidence of this on both sides 2/26; a difficult area on the left medial ankle. The patient clearly has chronic venous hypertension with some degree of lymphedema. The odd thing about the area is the small petechial hemorrhages. I am not really sure how to explain this. This was present last time and this is not a compression injury. We have been using Hydrofera Blue which I changed to last week 3/4; still using Hydrofera Blue. Aggressive debridement today. She does not have known arterial issues. She has seen Dr. Kellie Simmering at [facility] vein [facility] vascular and and has an ablation on the left. [Anterior accessory branch of the greater saphenous]. From what I remember they did not feel she had an arterial issue. The patient has had this area  biopsied in 2009 at [facility] dermatology and by her recollection they said this was "stasis". She is also follow-up with dermatology locally who thought that this was more of a vascular issue 3/11; using Hydrofera Blue. Aggressive debridement today. She does not have an arterial issue. We are using 3 layer compression although we may need to go to 4. The patient has been in for multiple changes to her wrap since I last saw her a week ago. She says that the area was leaking. I do not have too much more information on what was found 01/19/19 on evaluation today patient was actually being seen for a nurse visit when unfortunately she had the area on her left lateral lower extremity as well as weeping from the right lower extremity that became apparent. Therefore we did end up actually seeing her for a full visit with myself. She is having some pain at this site as well but fortunately nothing too significant at this point. No fevers, chills, nausea, or vomiting noted at this time. 3/18-Patient is back to the clinic with the left leg venous leg ulcer, the ulcer is larger in size, has a surface that is densely adherent with fibrinous tissue, the Hydrofera Blue was used but is densely adherent and there was difficulty in removing it. The right lower extremity was also wrapped for weeping edema. Patient has a new area over the left lateral foot above the malleolus that is small and appears to have no debris with intact surrounding skin. Patient is on  increased dose of Lasix also as a means to edema management 3/25; the patient has a nonhealing venous ulcer on the medial left leg and last week developed a smaller area on the lateral left calf. We have been using Hydrofera Blue with a contact layer. 4/1; no major change in these wounds areas. Left medial and more recently left lateral calf. I tried Iodoflex last week to aid in debridement she did not tolerate this. She stated her pain was terrible all week.  She took the top layer of the 4 layer compression off. 4/8; the patient actually looks somewhat better in terms of her more prominent left lateral calf wound. There is some healthy looking tissue here. She is still complaining of a lot of discomfort. 4/15; patient in a lot of pain secondary to sciatica. She is on a prednisone taper prescribed by her primary physician. She has the 2 areas one on the left medial and more recently a smaller area on the left lateral calf. Both of these just above the malleoli 4/22; her back pain is better but she still states she is very uncomfortable and now feels she is intolerant to the [employer]. No real change in the wounds we have been using Sorbact. She has been previously intolerant to Iodoflex. There is not a lot of option about what we can use to debride this wound under compression that she no doubt needs. sHe states Ultram no longer works for her pain 4/29; no major change in the wounds slightly increased depth. Surface on the original medial wound perhaps somewhat improved however the more recent area on the lateral left ankle is 100% covered in very adherent debris we have been using Sorbact. She tolerates 4 layer compression well and her edema control is a lot better. She has not had to come in for a nurse check 5/6; no major change in the condition of the wounds. She did consent to debridement today which was done with some difficulty. Continuing Sorbact. She did not tolerate Iodoflex. She was in for a check of her compression the day after we wrapped her last week this was adjusted but nothing much was found 5/13; no major change in the condition or area of the wounds. I was able to get a fairly aggressive debridement done on the lateral left leg wound. Even using Sorbact under compression. She came back in on Friday to have the wrap changed. She says she felt uncomfortable on the lateral aspect of her ankle. She has a long history of chronic venous  insufficiency including previous ablation surgery on this side. SHARNA, GABRYS ([id number]) Objective Constitutional Patient is hypertensive.. Pulse regular and within target range for patient.Marland Kitchen Respirations regular, non-labored and within target range.. Temperature is normal and within the target range for the patient.Marland Kitchen appears in no distress. Vitals Time Taken: 12:40 PM, Height: 63 in, Weight: 224.7 lbs, BMI: 39.8, Temperature: 97.6 F, Pulse: 79 bpm, Respiratory Rate: 18 breaths/min, Blood Pressure: 161/80 mmHg. General Notes: Wound exam; left medial calf and left lateral calf. We are beginning to see more viable tissue on the medial wound. I was able to do a debridement of the lateral wound but there is still no viable surface on either side. Integumentary (Hair, Skin) Wound #5 status is Open. Original cause of wound was Gradually Appeared. The wound is located on the Left,Medial Lower Leg. The wound measures 2.5cm length x 2.2cm width x 0.4cm depth; 4.32cm^2 area and 1.728cm^3 volume. There is Fat Layer (Subcutaneous  Tissue) Exposed exposed. There is no tunneling or undermining noted. There is a medium amount of serosanguineous drainage noted. The wound margin is indistinct and nonvisible. There is small (1-33%) red, pink granulation within the wound bed. There is a large (67-100%) amount of necrotic tissue within the wound bed including Adherent Slough. The periwound skin appearance exhibited: Maceration, Hemosiderin Staining. The periwound skin appearance did not exhibit: Callus, Crepitus, Excoriation, Induration, Rash, Scarring, Dry/Scaly, Atrophie Blanche, Cyanosis, Ecchymosis, Mottled, Pallor, Rubor, Erythema. Periwound temperature was noted as No Abnormality. The periwound has tenderness on palpation. Wound #6 status is Open. Original cause of wound was Gradually Appeared. The wound is located on the Left,Lateral Lower Leg. The wound measures 1.5cm length x 2.6cm width x 0.3cm  depth; 3.063cm^2 area and 0.919cm^3 volume. There is Fat Layer (Subcutaneous Tissue) Exposed exposed. There is no tunneling or undermining noted. There is a medium amount of serous drainage noted. The wound margin is flat and intact. There is no granulation within the wound bed. There is a large (67-100%) amount of necrotic tissue within the wound bed including Adherent Slough. The periwound skin appearance exhibited: Hemosiderin Staining. The periwound skin appearance did not exhibit: Callus, Crepitus, Excoriation, Induration, Rash, Scarring, Dry/Scaly, Maceration, Atrophie Blanche, Cyanosis, Ecchymosis, Mottled, Pallor, Rubor, Erythema. Periwound temperature was noted as No Abnormality. The periwound has tenderness on palpation. Assessment Active Problems ICD-10 Non-pressure chronic ulcer of left calf limited to breakdown of skin Chronic venous hypertension (idiopathic) with inflammation of right lower extremity Lymphedema, not elsewhere classified Procedures Wound #6 Pre-procedure diagnosis of Wound #6 is a Venous Leg Ulcer located on the Left,Lateral Lower Leg .Severity of Tissue Pre Debridement is: Fat layer exposed. There was a Excisional Skin/Subcutaneous Tissue Debridement with a total area of 3.9 sq HENRYETTA, CORRIVEAU. ([id number]) cm performed by [name] Dillon, MD. With the following instrument(s): Curette to remove Viable and Non-Viable tissue/material. Material removed includes Subcutaneous Tissue and Slough and after achieving pain control using Lidocaine. No specimens were taken. A time out was conducted at 13:09, prior to the start of the procedure. A Minimum amount of bleeding was controlled with Pressure. The procedure was tolerated well. Post Debridement Measurements: 1.5cm length x 2.6cm width x 0.4cm depth; 1.225cm^3 volume. Character of Wound/Ulcer Post Debridement requires further debridement. Severity of Tissue Post Debridement is: Fat layer exposed. Post procedure  Diagnosis Wound #6: Same as Pre-Procedure Plan Wound Cleansing: Wound #5 Left,Medial Lower Leg: Clean wound with Normal Saline. May Shower, gently pat wound dry prior to applying new dressing. Wound #6 Left,Lateral Lower Leg: Clean wound with Normal Saline. May Shower, gently pat wound dry prior to applying new dressing. Anesthetic (add to Medication List): Wound #5 Left,Medial Lower Leg: Topical Lidocaine 4% cream applied to wound bed prior to debridement (In Clinic Only). Wound #6 Left,Lateral Lower Leg: Topical Lidocaine 4% cream applied to wound bed prior to debridement (In Clinic Only). Skin Barriers/Peri-Wound Care: Wound #6 Left,Lateral Lower Leg: Other: - Antibiotic ointment on irritated areas Primary Wound Dressing: Wound #5 Left,Medial Lower Leg: Other: - Sorbact Wound #6 Left,Lateral Lower Leg: Other: - Sorbact Secondary Dressing: Wound #5 Left,Medial Lower Leg: ABD pad Wound #6 Left,Lateral Lower Leg: ABD pad Dressing Change Frequency: Wound #5 Left,Medial Lower Leg: Change dressing every week Wound #6 Left,Lateral Lower Leg: Change dressing every week Follow-up Appointments: Wound #5 Left,Medial Lower Leg: Return Appointment in 1 week. Nurse Visit as needed Wound #6 Left,Lateral Lower Leg: Return Appointment in 1 week. Nurse Visit as needed Edema  Control: Wound #5 Left,Medial Lower Leg: 3 Layer Compression System - Left Lower Extremity Wound #6 Left,Lateral Lower Leg: 3 Layer Compression System - Left Lower Extremity Off-Loading: [place], SARINANA ([id number]) Wound #5 Left,Medial Lower Leg: Other: - Elevate legs as needed Wound #6 Left,Lateral Lower Leg: Other: - Elevate legs as needed 1. Continue Sorbact not a lot of viable alternatives that would help with the debridement process in these wounds under compression. If we change to [facility] we have to bring her back in for a nurse change routinely 2. Bulge put her down to a 3 layer compression from a fork  to see if this helps with her discomfort Electronic Signature(s) Signed: 03/18/2019 4:30:12 PM By: Linton Ham MD Entered By: Linton Ham on 03/18/2019 13:22:52 Pore, [name] ([id number]) -------------------------------------------------------------------------------- SuperBill Details Patient Name: JAMALA, KOHEN. Date of Service: 03/18/2019 Medical Record Number: [id number] Patient Account Number: [id number] Date of Birth/Sex: 1946/10/07 (73 y.o. F) Treating RN: Cornell Barman Primary Care Provider: [name] Other Clinician: Referring Provider: [name] Treating Provider/Extender: Tito Dine in Treatment: 14 Diagnosis Coding ICD-10 Codes Code Description [phone] Non-pressure chronic ulcer of left calf limited to breakdown of skin I87.321 Chronic venous hypertension (idiopathic) with inflammation of right lower extremity I89.0 Lymphedema, not elsewhere classified Facility Procedures CPT4 Code Description: [id number] 11042 - DEB SUBQ TISSUE 20 SQ CM/< ICD-10 Diagnosis Description L97.221 Non-pressure chronic ulcer of left calf limited to breakdown I87.321 Chronic venous hypertension (idiopathic) with inflammation of Modifier: of skin right lower ext Quantity: 1 remity Physician Procedures CPT4 Code Description: [id number] 11042 - WC PHYS SUBQ TISS 20 SQ CM ICD-10 Diagnosis Description L97.221 Non-pressure chronic ulcer of left calf limited to breakdown I87.321 Chronic venous hypertension (idiopathic) with inflammation of Modifier: of skin right lower extr Quantity: 1 emity Electronic Signature(s) Signed: 03/18/2019 4:30:12 PM By: Linton Ham MD Entered By: Linton Ham on 03/18/2019 13:23:12

## 2019-03-24 DIAGNOSIS — M25562 Pain in left knee: Secondary | ICD-10-CM | POA: Diagnosis not present

## 2019-03-25 ENCOUNTER — Encounter: Payer: Medicare Other | Admitting: Internal Medicine

## 2019-03-25 ENCOUNTER — Other Ambulatory Visit: Payer: Self-pay

## 2019-03-25 DIAGNOSIS — I890 Lymphedema, not elsewhere classified: Secondary | ICD-10-CM | POA: Diagnosis not present

## 2019-03-25 DIAGNOSIS — S81802A Unspecified open wound, left lower leg, initial encounter: Secondary | ICD-10-CM | POA: Diagnosis not present

## 2019-03-25 DIAGNOSIS — L97221 Non-pressure chronic ulcer of left calf limited to breakdown of skin: Secondary | ICD-10-CM | POA: Diagnosis not present

## 2019-03-25 DIAGNOSIS — I87332 Chronic venous hypertension (idiopathic) with ulcer and inflammation of left lower extremity: Secondary | ICD-10-CM | POA: Diagnosis not present

## 2019-03-26 NOTE — Progress Notes (Signed)
HUMNA, MOOREHOUSE ([id number]) Visit Report for 03/25/2019 HPI Details Patient Name: [name], [name]. Date of Service: 03/25/2019 12:45 PM Medical Record Number: [id number] Patient Account Number: [id number] Date of Birth/Sex: Mar 28, 1946 (73 y.o. F) Treating RN: Cornell Barman Primary Care Provider: [name] Other Clinician: Referring Provider: [name] Treating Provider/Extender: Beverly Gust in Treatment: 15 History of Present Illness HPI Description: Pleasant 73 year old with history of chronic venous insufficiency. No diabetes or peripheral vascular disease. Left ABI 1.29. Questionable history of left lower extremity DVT. She developed a recurrent ulceration on her left lateral calf in December 2015, which she attributes to poor diet and subsequent lower extremity edema. She underwent endovenous laser ablation of her left greater saphenous vein in 2010. She underwent laser ablation of accessory branch of left GSV in April 2016 by Dr. Kellie Simmering at [facility]. She was previously wearing Unna boots, which she tolerated well. Tolerating 2 layer compression and cadexomer iodine. She returns to clinic for follow-up and is without new complaints. She denies any significant pain at this time. She reports persistent pain with pressure. No claudication or ischemic rest pain. No fever or chills. No drainage. READMISSION 11/13/16; this is a 74 year old woman who is not a diabetic. She is here for a review of a painful area on her left medial lower extremity. I note that she was seen here previously last year for wound I believe to be in the same area. At that time she had undergone previously a left greater saphenous vein ablation by Dr. Kellie Simmering and she had a ablation of the anterior accessory branch of the left greater saphenous vein in March 2016. Seeing that the wound actually closed over. In reviewing the history with her today the ulcer in this area has been recurrent. She describes  a biopsy of this area in 2009 that only showed stasis physiology. 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 Do not see that she has ever had arterial studies in the left leg. She tells me she has a follow-up with Dr. Kellie Simmering in roughly 10 days In any case she developed the reopening of this area roughly a month ago. On the background of this she describes rapidly increasing edema which has responded to Lasix 40 mg and metolazone 2.5 mg as well as the patient's lymph massage. She has been told she has both venous insufficiency and lymphedema but she cannot tolerate compression stockings 11/28/16; the patient saw Dr. Kellie Simmering recently. Per the patient he did arterial Dopplers in the office that did not show evidence of arterial insufficiency, per the patient he stated "treat this like an ordinary venous ulcer". She also saw her dermatologist Dr. Ronnald Ramp who felt that this was more of a vascular ulcer. In general things are improving although she arrives today with increasing bilateral lower extremity edema with weeping a deeper fluid through the wound on the left medial leg compatible with some degree of lymphedema 12/04/16; the patient's wound is fully epithelialized but I don't think fully healed. We will do another week of depression with Promogran and TCA however I suspect we'll be able to discharge her next week. This is a very unusual-looking wound which was initially a figure-of-eight type wound lying on its side surrounded by petechial like hemorrhage. She has  had venous ablation on this side. She apparently does not have an arterial issue per Dr. Kellie Simmering. She saw her dermatologist thought it was "vascular". Patient is definitely going to need ongoing compression and I talked about  this with her today she will go to elastic therapy after she leaves here next week 12/11/16; the patient's wound is not completely closed today. She has surrounding scar tissue and in further discussion with the patient it would appear that she had ulcers in this area in 2009 for a prolonged period of time ultimately requiring a punch biopsy of this area that only showed venous insufficiency. I did not previously pickup on this part of the history from the patient. 12/18/16; the patient's wound is completely epithelialized. There is no open area here. She has significant bilateral venous insufficiency with secondary lymphedema to a mild-to-moderate degree she does not have compression stockings.. She did not say anything to me when I was in the room, she told our intake nurse that she was still having pain in this area. This isn't unusual recurrent small open area. She is going to go to elastic therapy to obtain compression stockings. 12/25/16; the patient's wound is fully epithelialized. There is no open area here. The patient describes some continued episodic discomfort in this area medial left calf. However everything looks fine and healed here. She is been to elastic therapy and caught herself 15-20 mmHg stockings, they apparently were having trouble getting 20-30 mm stockings in her size 01/22/17; this is a patient we discharged from the clinic a month ago. She has a recurrent open wound on her medial left calf. She had 15 mm support stockings. I told her I thought she needed 20-30 mm compression stockings. She tells me that she has been ill with hospitalization secondary to asthma and is been found to have severe hypokalemia likely secondary to a combination of Lasix and metolazone. This morning she noted blistering and leaking fluid on the posterior part of her left leg. [name], [name] ([id number]) She called our intake nurse urgently and we was saw her this afternoon. She has not had any real  discomfort here. I don't know that she's been wearing any stockings on this leg for at least 2-3 days. ABIs in this clinic were 1.21 on the right and 1.3 on the left. She is previously seen vascular surgery who does not think that there is a peripheral arterial issue. 01/30/17; Patient arrives with no open wound on the left leg. She has been to elastic therapy and obtained 20-62mmhg below knee stockings and she has one on the right leg today. READMISSION 02/19/18; this Kohli is a now 73 year old patient we've had in this clinic perhaps 3 times before. I had last looked at her from January 07 December 2016 with an area on the medial left leg. We discharged her on 12/25/16 however she had to be readmitted on 01/22/17 with a recurrence. I have in my notes that we discharged her on 20-30 mm stockings although she tells me she was only wearing support hose because she cannot get stockings on predominantly related to her cervical spine surgery/issues. She has had previous ablations done by vein and vascular in [place] including a great saphenous vein ablation on the left with an anterior accessory branch ablation I think both of these were in 2016. On one of the previous visit she had a biopsy noted 2009 that was negative. She is not felt to have an arterial issue. She is not a  diabetic. She does have a history of obstructive sleep apnea hypertension asthma as well as chronic venous insufficiency and lymphedema. On this occasion she noted 2 dry scaly patch on her left leg. She tried to put lotion on this it didn't really help. There were 2 open areas.the patient has been seeing her primary physician from 02/05/18 through 02/14/18. She had Unna boots applied. The superior wound now on the lateral left leg has closed but she's had one wound that remains open on the lateral left leg. This is not the same spot as we dealt with in 2018. ABIs in this clinic were 1.3 bilaterally 02/26/18; patient has a small wound  on the left lateral calf. Dimensions are down. She has chronic venous insufficiency and lymphedema. 03/05/18; small open area on the left lateral calf. Dimensions are down. Tightly adherent necrotic debris over the surface of the wound which was difficult to remove. Also the dressing [over collagen] stuck to the wound surface. This was removed with some difficulty as well. Change the primary dressing to Hydrofera Blue ready 03/12/18; small open area on the left lateral calf. Comes in with tightly adherent surface eschar as well as some adherent Hydrofera Blue. 03/19/18; open area on the left lateral calf. Again adherent surface eschar as well as some adherent Hydrofera Blue nonviable subcutaneous tissue. She complained of pain all week even with the reduction from 4-3 layer compression I put on last week. Also she had an increase in her ankle and calf measurements probably related to the same thing. 03/26/18; open area on the left lateral calf. A very small open area remains here. We used silver alginate starting last week as the Hydrofera Blue seem to stick to the wound bed. In using 4-layer compression 04/02/18; the open area in the left lateral calf at some adherent slough which I removed there is no open area here. We are able to transition her into her own compression stocking. Truthfully I think this is probably his support hose. However this does not maintain skin integrity will be limited. She cannot put over the toe compression stockings on because of neck problems hand problems etc. She is allergic to the lining layer of juxta lites. We might be forced to use extremitease stocking should this fail READMIT 11/24/2018 Patient is now a 73 year old woman who is not a diabetic. She has been in this clinic on at least 3 previous occasions largely with recurrent wounds on her left leg secondary to chronic venous insufficiency with secondary lymphedema. Her situation is complicated by inability to get  stockings on and an allergy to neoprene which is apparently a component and at least juxta lites and other stockings. As a result she really has not been wearing any stockings on her legs. She tells [place] that roughly 2 or 3 weeks ago she started noticing a stinging sensation just above her ankle on the left medial aspect. She has been diagnosed with pseudogout and she wondered whether this was what she was experiencing. She tried to dress this with something she bought at the store however subsequently it pulled skin off and now she has an open wound that is not improving. She has been using Vaseline gauze with a cover bandage. She saw her primary doctor last week who put an Haematologist on her. ABIs in this clinic was 1.03 on the left 2/12; the area is on the left medial ankle. Odd-looking wound with what looks to be surface epithelialization but a multitude of small  petechial openings. This clearly not closed yet. We have been using silver alginate under 3 layer compression with TCA 2/19; the wound area did not look quite as good this week. Necrotic debris over the majority of the wound surface which required debridement. She continues to have a multitude of what looked to be small petechial openings. She reminds [place] that she had a biopsy on this initially during her first outbreak in 2015 in [facility]. She expresses concern about this being a possible melanoma. 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 I assured her that this does not look anything like melanoma. Except for the petechial reaction it does look like a venous insufficiency area and she certainly has evidence of this on both sides 2/26; a difficult area on the left medial ankle. The patient clearly has chronic venous hypertension with some degree of lymphedema. The odd thing about the area is the small petechial hemorrhages. I am not  really sure how to explain this. This was present last time and this is not a compression injury. We have been using Hydrofera Blue which I changed to last week 3/4; still using Hydrofera Blue. Aggressive debridement today. She does not have known arterial issues. She has seen Dr. Ferdinand Lango, [name] ([id number]) Kellie Simmering at [facility] vein and vascular and and has an ablation on the left. [Anterior accessory branch of the greater saphenous]. From what I remember they did not feel she had an arterial issue. The patient has had this area biopsied in 2009 at [facility] dermatology and by her recollection they said this was "stasis". She is also follow-up with dermatology locally who thought that this was more of a vascular issue 3/11; using Hydrofera Blue. Aggressive debridement today. She does not have an arterial issue. We are using 3 layer compression although we may need to go to 4. The patient has been in for multiple changes to her wrap since I last saw her a week ago. She says that the area was leaking. I do not have too much more information on what was found 01/19/19 on evaluation today patient was actually being seen for a nurse visit when unfortunately she had the area on her left lateral lower extremity as well as weeping from the right lower extremity that became apparent. Therefore we did end up actually seeing her for a full visit with myself. She is having some pain at this site as well but fortunately nothing too significant at this point. No fevers, chills, nausea, or vomiting noted at this time. 3/18-Patient is back to the clinic with the left leg venous leg ulcer, the ulcer is larger in size, has a surface that is densely adherent with fibrinous tissue, the Hydrofera Blue was used but is densely adherent and there was difficulty in removing it. The right lower extremity was also wrapped for weeping edema. Patient has a new area over the left lateral foot above the malleolus that is small  and appears to have no debris with intact surrounding skin. Patient is on increased dose of Lasix also as a means to edema management 3/25; the patient has a nonhealing venous ulcer on the medial left leg and last week developed a smaller area on the lateral left calf. We have been using Hydrofera Blue with a contact layer. 4/1; no major change in these wounds areas. Left medial and more recently left lateral calf. I tried Iodoflex last week to  aid in debridement she did not tolerate this. She stated her pain was terrible all week. She took the top layer of the 4 layer compression off. 4/8; the patient actually looks somewhat better in terms of her more prominent left lateral calf wound. There is some healthy looking tissue here. She is still complaining of a lot of discomfort. 4/15; patient in a lot of pain secondary to sciatica. She is on a prednisone taper prescribed by her primary physician. She has the 2 areas one on the left medial and more recently a smaller area on the left lateral calf. Both of these just above the malleoli 4/22; her back pain is better but she still states she is very uncomfortable and now feels she is intolerant to the [employer]. No real change in the wounds we have been using Sorbact. She has been previously intolerant to Iodoflex. There is not a lot of option about what we can use to debride this wound under compression that she no doubt needs. sHe states Ultram no longer works for her pain 4/29; no major change in the wounds slightly increased depth. Surface on the original medial wound perhaps somewhat improved however the more recent area on the lateral left ankle is 100% covered in very adherent debris we have been using Sorbact. She tolerates 4 layer compression well and her edema control is a lot better. She has not had to come in for a nurse check 5/6; no major change in the condition of the wounds. She did consent to debridement today which was done with  some difficulty. Continuing Sorbact. She did not tolerate Iodoflex. She was in for a check of her compression the day after we wrapped her last week this was adjusted but nothing much was found 5/13; no major change in the condition or area of the wounds. I was able to get a fairly aggressive debridement done on the lateral left leg wound. Even using Sorbact under compression. She came back in on Friday to have the wrap changed. She says she felt uncomfortable on the lateral aspect of her ankle. She has a long history of chronic venous insufficiency including previous ablation surgery on this side. 5/20-Patient returns for wounds on left leg with both wounds covered in slough, with the lateral leg wound larger in size, she has been in 3 layer compression and felt more comfortable, she describes pain in ankle, in leg and pins and needles in foot, and is about to try Pamelor for this Electronic Signature(s) Signed: 03/25/2019 1:20:55 PM By: Tobi Bastos Entered By: Tobi Bastos on 03/25/2019 13:20:55 [name], [name] ([id number]) -------------------------------------------------------------------------------- Physical Exam Details Patient Name: [name], [name]. Date of Service: 03/25/2019 12:45 PM Medical Record Number: [id number] Patient Account Number: [id number] Date of Birth/Sex: Mar 03, 1946 (73 y.o. F) Treating RN: Cornell Barman Primary Care Provider: [name] Other Clinician: Referring Provider: [name] Treating Provider/Extender: Beverly Gust in Treatment: 15 Constitutional alert and oriented x 3. sitting or standing blood pressure is within target range for patient.. supine blood pressure is within target range for patient.. pulse regular and within target range for patient.Marland Kitchen respirations regular, non-labored and within target range for patient.Marland Kitchen temperature within target range for patient.. . . [name] and well-hydrated in no acute  distress. 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 Date of Service: 03/25/2019 12:45 PM Medical Record Number: [id number] Patient Account Number: [id number] Date of Birth/Sex: October 05, 1946 (73 y.o. F) Treating RN: Cornell Barman Primary Care Provider: [name] Other Clinician: Referring Provider: [name] Treating Provider/Extender: Beverly Gust in Treatment: 15 Verbal / Phone Orders: No Diagnosis Coding Wound Cleansing Wound #5 Left,Medial Lower Leg o Clean wound with Normal Saline. o May Shower, gently pat wound dry prior to applying new dressing. Wound #6 Left,Lateral Lower Leg o Clean wound with Normal Saline. o May Shower, gently pat wound dry prior to applying new dressing. Anesthetic (add to Medication List) Wound #5 Left,Medial Lower Leg o Topical Lidocaine 4% cream applied to wound bed prior to debridement (In Clinic Only). Wound #6 Left,Lateral Lower Leg o Topical Lidocaine 4% cream applied to wound bed prior to debridement (In Clinic Only). Skin Barriers/Peri-Wound Care Wound #5 Left,Medial Lower Leg o Other: - Antibiotic ointment on irritated areas Wound #6 Left,Lateral Lower Leg o Other: - Antibiotic ointment on irritated areas Primary Wound Dressing Wound #5 Left,Medial Lower Leg o Other: - Sorbact Wound #6 Left,Lateral Lower Leg o Other: - Sorbact Secondary  Dressing Wound #5 Left,Medial Lower Leg o ABD pad Wound #6 Left,Lateral Lower Leg o ABD pad Dressing Change Frequency Wound #5 Left,Medial Lower Leg o Change dressing every week Wound #6 Left,Lateral Lower Leg o Change dressing every week Follow-up Appointments Wound #5 Left,Medial Lower Leg o Return Appointment in 1 week. o Nurse Visit as needed Wound #6 Left,Lateral Lower Leg o Return Appointment in 1 week. [name], [name] ([id number]) o Nurse Visit as needed Edema Control Wound #5 Left,Medial Lower Leg o 4 Layer Compression System - Bilateral Wound #6 Left,Lateral Lower Leg o 4 Layer Compression System - Bilateral Off-Loading Wound #5 Left,Medial Lower Leg o Other: - Elevate legs as needed Wound #6 Left,Lateral Lower Leg o Other: - Elevate legs as needed Electronic Signature(s) Signed: 03/25/2019 4:16:13 PM By: Tobi Bastos Signed: 03/25/2019 5:05:53 PM By: [name], [name], RN, CWS, [name], [name] Entered By: [name], [name], RN, CWS, Kim on 03/25/2019 13:14:55 [name], [name] ([id number]) -------------------------------------------------------------------------------- Progress Note Details Patient Name: [name], [name]. Date of Service: 03/25/2019 12:45 PM Medical Record Number: [id number] Patient Account Number: [id number] Date of Birth/Sex: [birth year] (73 y.o. F) Treating RN: Cornell Barman Primary Care Provider: [name] Other Clinician: Referring Provider: [name] Treating Provider/Extender: Beverly Gust in Treatment: 15 Subjective History of Present Illness (HPI) Pleasant 73 year old with history of chronic venous insufficiency. No diabetes or peripheral vascular disease. Left ABI 1.29. Questionable history of left lower extremity DVT. She developed a recurrent ulceration on her left lateral calf in December 2015, which she attributes to poor diet and subsequent lower extremity edema. She underwent endovenous laser ablation of  her left greater saphenous vein in 2010. She underwent laser ablation of accessory branch of left GSV in April 2016 by Dr. Kellie Simmering at [facility]. She was previously wearing Unna boots, which she tolerated well. Tolerating 2 layer compression and cadexomer iodine. She returns to clinic for follow-up and is without new complaints. She denies any significant pain at this time. She reports persistent pain with pressure. No claudication or ischemic rest pain. No fever or chills. No drainage.  READMISSION 11/13/16; this is a 73 year old woman who is not a diabetic. She is here for a review of a painful area on her left medial lower extremity. I note that she was seen here previously last year for wound I believe to be in the same area. At that time she had undergone previously a left greater saphenous vein ablation by Dr. Kellie Simmering and she had a ablation of the anterior accessory branch of the left greater saphenous vein in March 2016. Seeing that the wound actually closed over. In reviewing the history with her today the ulcer in this area has been recurrent. She describes a biopsy of this area in 2009 that only showed stasis physiology. 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 Do not see that she has ever had arterial studies in the left leg. She tells me she has a follow-up with Dr. Kellie Simmering in roughly 10 days In any case she developed the reopening of this area roughly a month ago. On the background of this she describes rapidly increasing edema which has responded to Lasix 40 mg and metolazone 2.5 mg as well as the patient's lymph massage. She has been told she has both venous insufficiency and lymphedema but she cannot tolerate compression stockings 11/28/16; the patient saw Dr. Kellie Simmering  recently. Per the patient he did arterial Dopplers in the office that did not show evidence of arterial insufficiency, per the patient he stated "treat this like an ordinary venous ulcer". She also saw her dermatologist Dr. Ronnald Ramp who felt that this was more of a vascular ulcer. In general things are improving although she arrives today with increasing bilateral lower extremity edema with weeping a deeper fluid through the wound on the left medial leg compatible with some degree of lymphedema 12/04/16; the patient's wound is fully epithelialized but I don't think fully healed. We will do another week of depression with Promogran and TCA however I suspect we'll be able to discharge her next week. This is a very unusual-looking wound which was initially a figure-of-eight type wound lying on its side surrounded by petechial like hemorrhage. She has had venous ablation on this side. She apparently does not have an arterial issue per Dr. Kellie Simmering. She saw her dermatologist thought it was "vascular". Patient is definitely going to need ongoing compression and I talked about this with her today she will go to elastic therapy after she leaves here next week 12/11/16; the patient's wound is not completely closed today. She has surrounding scar tissue and in further discussion with the patient it would appear that she had ulcers in this area in 2009 for a prolonged period of time ultimately requiring a punch biopsy of this area that only showed venous insufficiency. I did not previously pickup on this part of the history from the patient. 12/18/16; the patient's wound is completely epithelialized. There is no open area here. She has significant bilateral venous insufficiency with secondary lymphedema to a mild-to-moderate degree she does not have compression stockings.. She did not say anything to me when I was in the room, she told our intake nurse that she was still having pain in this area. This isn't unusual  recurrent small open area. She is going to go to elastic therapy to obtain compression stockings. 12/25/16;  the patient's wound is fully epithelialized. There is no open area here. The patient describes some continued episodic discomfort in this area medial left calf. However everything looks fine and healed here. She is been to elastic therapy and caught herself 15-20 mmHg stockings, they apparently were having trouble getting 20-30 mm stockings in her size 01/22/17; this is a patient we discharged from the clinic a month ago. She has a recurrent open wound on her medial left calf. She had 15 mm support stockings. I told her I thought she needed 20-30 mm compression stockings. She tells me that she has been ill with hospitalization secondary to asthma and is been found to have severe hypokalemia likely secondary to a combination of Lasix and metolazone. This morning she noted blistering and leaking fluid on the posterior part of her left leg. She called our intake nurse urgently and we was saw her this afternoon. She has not had any real discomfort here. I don't know that she's been wearing any stockings on this leg for at least 2-3 days. ABIs in this clinic were 1.21 on the right and 1.3 on the left. She is previously seen vascular surgery who does not think that there is a peripheral arterial issue. 01/30/17; Patient arrives with no open wound on the left leg. She has been to elastic therapy and obtained 20-33mmhg below [name], [name]. ([id number]) knee stockings and she has one on the right leg today. READMISSION 02/19/18; this Gerbino is a now 73 year old patient we've had in this clinic perhaps 3 times before. I had last looked at her from January 07 December 2016 with an area on the medial left leg. We discharged her on 12/25/16 however she had to be readmitted on 01/22/17 with a recurrence. I have in my notes that we discharged her on 20-30 mm stockings although she tells me she was only wearing  support hose because she cannot get stockings on predominantly related to her cervical spine surgery/issues. She has had previous ablations done by vein and vascular in [place] including a great saphenous vein ablation on the left with an anterior accessory branch ablation I think both of these were in 2016. On one of the previous visit she had a biopsy noted 2009 that was negative. She is not felt to have an arterial issue. She is not a diabetic. She does have a history of obstructive sleep apnea hypertension asthma as well as chronic venous insufficiency and lymphedema. On this occasion she noted 2 dry scaly patch on her left leg. She tried to put lotion on this it didn't really help. There were 2 open areas.the patient has been seeing her primary physician from 02/05/18 through 02/14/18. She had Unna boots applied. The superior wound now on the lateral left leg has closed but she's had one wound that remains open on the lateral left leg. This is not the same spot as we dealt with in 2018. ABIs in this clinic were 1.3 bilaterally 02/26/18; patient has a small wound on the left lateral calf. Dimensions are down. She has chronic venous insufficiency and lymphedema. 03/05/18; small open area on the left lateral calf. Dimensions are down. Tightly adherent necrotic debris over the surface of the wound which was difficult to remove. Also the dressing [over collagen] stuck to the wound surface. This was removed with some difficulty as well. Change the primary dressing to Hydrofera Blue ready 03/12/18; small open area on the left lateral calf. Comes in with tightly adherent surface eschar as  well as some adherent Hydrofera Blue. 03/19/18; open area on the left lateral calf. Again adherent surface eschar as well as some adherent Hydrofera Blue nonviable subcutaneous tissue. She complained of pain all week even with the reduction from 4-3 layer compression I put on last week. Also she had an increase in her  ankle and calf measurements probably related to the same thing. 03/26/18; open area on the left lateral calf. A very small open area remains here. We used silver alginate starting last week as the Hydrofera Blue seem to stick to the wound bed. In using 4-layer compression 04/02/18; the open area in the left lateral calf at some adherent slough which I removed there is no open area here. We are able to transition her into her own compression stocking. Truthfully I think this is probably his support hose. However this does not maintain skin integrity will be limited. She cannot put over the toe compression stockings on because of neck problems hand problems etc. She is allergic to the lining layer of juxta lites. We might be forced to use extremitease stocking should this fail READMIT 11/24/2018 Patient is now a 73 year old woman who is not a diabetic. She has been in this clinic on at least 3 previous occasions largely with recurrent wounds on her left leg secondary to chronic venous insufficiency with secondary lymphedema. Her situation is complicated by inability to get stockings on and an allergy to neoprene which is apparently a component and at least juxta lites and other stockings. As a result she really has not been wearing any stockings on her legs. She tells [place] that roughly 2 or 3 weeks ago she started noticing a stinging sensation just above her ankle on the left medial aspect. She has been diagnosed with pseudogout and she wondered whether this was what she was experiencing. She tried to dress this with something she bought at the store however subsequently it pulled skin off and now she has an open wound that is not improving. She has been using Vaseline gauze with a cover bandage. She saw her primary doctor last week who put an Haematologist on her. ABIs in this clinic was 1.03 on the left 2/12; the area is on the left medial ankle. Odd-looking wound with what looks to be surface  epithelialization but a multitude of small petechial openings. This clearly not closed yet. We have been using silver alginate under 3 layer compression with TCA 2/19; the wound area did not look quite as good this week. Necrotic debris over the majority of the wound surface which required debridement. She continues to have a multitude of what looked to be small petechial openings. She reminds [place] that she had a biopsy on this initially during her first outbreak in 2015 in [facility]. She expresses concern about this being a possible melanoma. 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 I assured her that this does not look anything like melanoma. Except for the petechial reaction it does look like a venous insufficiency area and she certainly has evidence of this on both sides 2/26; a difficult area on the left medial ankle. The patient clearly has chronic venous hypertension with some degree of lymphedema. The odd thing about the area is the small petechial hemorrhages. I am not really sure how to explain this. This was present last time and this is not a compression injury. We have been using Hydrofera  Blue which I changed to last week 3/4; still using Hydrofera Blue. Aggressive debridement today. She does not have known arterial issues. She has seen Dr. Kellie Simmering at [facility] vein [facility] vascular and and has an ablation on the left. [Anterior accessory branch of the greater saphenous]. From what I remember they did not feel she had an arterial issue. The patient has had this area biopsied in 2009 at [facility] dermatology and by her recollection they said this was "stasis". She is also follow-up with dermatology locally who thought that this was more of a vascular issue [name], [name] ([id number]) 3/11; using Hydrofera Blue. Aggressive debridement today. She does not have an arterial issue. We are using  3 layer compression although we may need to go to 4. The patient has been in for multiple changes to her wrap since I last saw her a week ago. She says that the area was leaking. I do not have too much more information on what was found 01/19/19 on evaluation today patient was actually being seen for a nurse visit when unfortunately she had the area on her left lateral lower extremity as well as weeping from the right lower extremity that became apparent. Therefore we did end up actually seeing her for a full visit with myself. She is having some pain at this site as well but fortunately nothing too significant at this point. No fevers, chills, nausea, or vomiting noted at this time. 3/18-Patient is back to the clinic with the left leg venous leg ulcer, the ulcer is larger in size, has a surface that is densely adherent with fibrinous tissue, the Hydrofera Blue was used but is densely adherent and there was difficulty in removing it. The right lower extremity was also wrapped for weeping edema. Patient has a new area over the left lateral foot above the malleolus that is small and appears to have no debris with intact surrounding skin. Patient is on increased dose of Lasix also as a means to edema management 3/25; the patient has a nonhealing venous ulcer on the medial left leg and last week developed a smaller area on the lateral left calf. We have been using Hydrofera Blue with a contact layer. 4/1; no major change in these wounds areas. Left medial and more recently left lateral calf. I tried Iodoflex last week to aid in debridement she did not tolerate this. She stated her pain was terrible all week. She took the top layer of the 4 layer compression off. 4/8; the patient actually looks somewhat better in terms of her more prominent left lateral calf wound. There is some healthy looking tissue here. She is still complaining of a lot of discomfort. 4/15; patient in a lot of pain secondary to  sciatica. She is on a prednisone taper prescribed by her primary physician. She has the 2 areas one on the left medial and more recently a smaller area on the left lateral calf. Both of these just above the malleoli 4/22; her back pain is better but she still states she is very uncomfortable and now feels she is intolerant to the [employer]. No real change in the wounds we have been using Sorbact. She has been previously intolerant to Iodoflex. There is not a lot of option about what we can use to debride this wound under compression that she no doubt needs. sHe states Ultram no longer works for her pain 4/29; no major change in the wounds slightly increased depth. Surface on the original medial wound  perhaps somewhat improved however the more recent area on the lateral left ankle is 100% covered in very adherent debris we have been using Sorbact. She tolerates 4 layer compression well and her edema control is a lot better. She has not had to come in for a nurse check 5/6; no major change in the condition of the wounds. She did consent to debridement today which was done with some difficulty. Continuing Sorbact. She did not tolerate Iodoflex. She was in for a check of her compression the day after we wrapped her last week this was adjusted but nothing much was found 5/13; no major change in the condition or area of the wounds. I was able to get a fairly aggressive debridement done on the lateral left leg wound. Even using Sorbact under compression. She came back in on Friday to have the wrap changed. She says she felt uncomfortable on the lateral aspect of her ankle. She has a long history of chronic venous insufficiency including previous ablation surgery on this side. 5/20-Patient returns for wounds on left leg with both wounds covered in slough, with the lateral leg wound larger in size, she has been in 3 layer compression and felt more comfortable, she describes pain in ankle, in leg and pins  and needles in foot, and is about to try Pamelor for this Objective Constitutional alert and oriented x 3. sitting or standing blood pressure is within target range for patient.. supine blood pressure is within target range for patient.. pulse regular and within target range for patient.Marland Kitchen respirations regular, non-labored and within target range for patient.Marland Kitchen temperature within target range for patient.. [name] and well-hydrated in no acute distress. Vitals Time Taken: 12:50 PM, Height: 63 in, Weight: 224.7 lbs, BMI: 39.8, Temperature: 98.1 F, Pulse: 76 bpm, Respiratory Rate: 18 breaths/min, Blood Pressure: 154/78 mmHg. General Notes: Left lateral and medial distal leg wounds with significant areas of debris, patient declined debridement owing to severity of pain, i recommended that this should be done each visit that this degree of slough is noticed and she has agreed to be in 4 Layer compression and have debridement the next time she is here Integumentary (Hair, Skin) Wound #5 status is Open. Original cause of wound was Gradually Appeared. The wound is located on the [place] ([id number]) Leg. The wound measures 2.5cm length x 2.5cm width x 0.4cm depth; 4.909cm^2 area and 1.963cm^3 volume. There is Fat Layer (Subcutaneous Tissue) Exposed exposed. There is no tunneling or undermining noted. There is a medium amount of serosanguineous drainage noted. The wound margin is indistinct and nonvisible. There is small (1-33%) red, pink granulation within the wound bed. There is a large (67-100%) amount of necrotic tissue within the wound bed including Adherent Slough. Wound #6 status is Open. Original cause of wound was Gradually Appeared. The wound is located on the Left,Lateral Lower Leg. The wound measures 2cm length x 3.5cm width x 0.5cm depth; 5.498cm^2 area and 2.749cm^3 volume. There is Fat Layer (Subcutaneous Tissue) Exposed exposed. There is no tunneling or  undermining noted. There is a medium amount of serous drainage noted. The wound margin is flat and intact. There is no granulation within the wound bed. There is a large (67-100%) amount of necrotic tissue within the wound bed including Adherent Slough. Procedures Wound #5 Pre-procedure diagnosis of Wound #5 is a Lymphedema located on the Left,Medial Lower Leg . There was a Four Layer Compression Therapy Procedure with a pre-treatment ABI of 1 by [name],  Maudie Mercury, RN. Post procedure Diagnosis Wound #5: Same as Pre-Procedure Plan Wound Cleansing: Wound #5 Left,Medial Lower Leg: Clean wound with Normal Saline. May Shower, gently pat wound dry prior to applying new dressing. Wound #6 Left,Lateral Lower Leg: Clean wound with Normal Saline. May Shower, gently pat wound dry prior to applying new dressing. Anesthetic (add to Medication List): Wound #5 Left,Medial Lower Leg: Topical Lidocaine 4% cream applied to wound bed prior to debridement (In Clinic Only). Wound #6 Left,Lateral Lower Leg: Topical Lidocaine 4% cream applied to wound bed prior to debridement (In Clinic Only). Skin Barriers/Peri-Wound Care: Wound #5 Left,Medial Lower Leg: Other: - Antibiotic ointment on irritated areas Wound #6 Left,Lateral Lower Leg: Other: - Antibiotic ointment on irritated areas Primary Wound Dressing: Wound #5 Left,Medial Lower Leg: Other: - Sorbact Wound #6 Left,Lateral Lower Leg: Other: - Sorbact Secondary Dressing: Wound #5 Left,Medial Lower Leg: ABD pad Wound #6 Left,Lateral Lower Leg: ABD pad Dressing Change Frequency: Wound #5 Left,Medial Lower Leg: Change dressing every week Wound #6 Left,Lateral Lower Leg: Change dressing every week Follow-up Appointments: Wound #5 Left,Medial Lower Leg: Return Appointment in 1 week. Nurse Visit as needed Wound #6 Left,Lateral Lower Leg: Return Appointment in 1 week. [name], [name] ([id number]) Nurse Visit as needed Edema Control: Wound #5  Left,Medial Lower Leg: 4 Layer Compression System - Bilateral Wound #6 Left,Lateral Lower Leg: 4 Layer Compression System - Bilateral Off-Loading: Wound #5 Left,Medial Lower Leg: Other: - Elevate legs as needed Wound #6 Left,Lateral Lower Leg: Other: - Elevate legs as needed 1.Continue using sorbact and now 4 layer compression 2. Return to clinic next week Electronic Signature(s) Signed: 03/25/2019 1:24:16 PM By: Tobi Bastos Entered By: Tobi Bastos on 03/25/2019 13:24:16 [name], [name] ([id number]) -------------------------------------------------------------------------------- SuperBill Details Patient Name: [name], MARENGO. Date of Service: 03/25/2019 Medical Record Number: [id number] Patient Account Number: [id number] Date of Birth/Sex: December 17, 1945 (73 y.o. F) Treating RN: Cornell Barman Primary Care Provider: [name] Other Clinician: Referring Provider: [name] Treating Provider/Extender: Beverly Gust in Treatment: 15 Diagnosis Coding ICD-10 Codes Code Description [phone] Non-pressure chronic ulcer of left calf limited to breakdown of skin I87.321 Chronic venous hypertension (idiopathic) with inflammation of right lower extremity I89.0 Lymphedema, not elsewhere classified Facility Procedures CPT4 Code: [id number] Description: (Facility Use Only) [phone] - [place] LWR LT LEG Modifier: Quantity: 1 Physician Procedures CPT4 Code: [id number] Description: 07867 - WC PHYS LEVEL 3 - EST PT ICD-10 Diagnosis Description L97.221 Non-pressure chronic ulcer of left calf limited to breakdown Modifier: of skin Quantity: 1 Electronic Signature(s) Signed: 03/25/2019 1:24:30 PM By: Tobi Bastos Entered By: Tobi Bastos on 03/25/2019 13:24:30

## 2019-03-26 NOTE — Progress Notes (Signed)
SHANIQUE, ASLINGER ([id number]) Visit Report for 03/25/2019 Arrival Information Details Patient Name: [name], [name]. Date of Service: 03/25/2019 12:45 PM Medical Record Number: [id number] Patient Account Number: [id number] Date of Birth/Sex: 04-Feb-1946 (73 y.o. F) Treating RN: Harold Barban Primary Care Avenly Roberge: [name] Other Clinician: Referring Lazer Wollard: [name] Treating Lorayne Getchell/Extender: Beverly Gust in Treatment: 15 Visit Information History Since Last Visit Added or deleted any medications: No Patient Arrived: Ambulatory Any new allergies or adverse reactions: No Arrival Time: 12:47 Had a fall or experienced change in No Accompanied By: self activities of daily living that may affect Transfer Assistance: None risk of falls: Patient Identification Verified: Yes Signs or symptoms of abuse/neglect since last visito No Secondary Verification Process Completed: Yes Hospitalized since last visit: No Has Dressing in Place as Prescribed: Yes Has Compression in Place as Prescribed: Yes Pain Present Now: Yes Electronic Signature(s) Signed: 03/25/2019 4:44:13 PM By: Harold Barban Entered By: Harold Barban on 03/25/2019 12:48:23 Lenzen, [name] ([id number]) -------------------------------------------------------------------------------- Compression Therapy Details Patient Name: JACYLN, CARMER. Date of Service: 03/25/2019 12:45 PM Medical Record Number: [id number] Patient Account Number: [id number] Date of Birth/Sex: 10-20-1946 (73 y.o. F) Treating RN: Cornell Barman Primary Care Yusif Gnau: [name] Other Clinician: Referring Idalie Canto: [name] Treating Chavon Lucarelli/Extender: Beverly Gust in Treatment: 15 Compression Therapy Performed for Wound Assessment: Wound #5 Left,Medial Lower Leg Performed By: Clinician Cornell Barman, RN Compression Type: Four Layer Pre Treatment ABI: 1 Post Procedure Diagnosis Same as Pre-procedure Electronic  Signature(s) Signed: 03/25/2019 5:05:53 PM By: [name], [name], RN, CWS, [name], [name] Entered By: [name], [name], RN, CWS, Kim on 03/25/2019 13:15:46 [name] ([id number]) -------------------------------------------------------------------------------- Lower Extremity Assessment Details Patient Name: [name], [name]. Date of Service: 03/25/2019 12:45 PM Medical Record Number: [id number] Patient Account Number: [id number] Date of Birth/Sex: May 05, 1946 (73 y.o. F) Treating RN: Harold Barban Primary Care Tymier Lindholm: [name] Other Clinician: Referring Breona Cherubin: [name] Treating Candie Gintz/Extender: Beverly Gust in Treatment: 15 Edema Assessment Assessed: [Left: No] [Right: No] Edema: [Left: Ye] [Right: s] Calf Left: Right: Point of Measurement: 31 cm From Medial Instep 42.7 cm cm Ankle Left: Right: Point of Measurement: 13 cm From Medial Instep 24.8 cm cm Vascular Assessment Pulses: Dorsalis Pedis Palpable: [Left:Yes] Electronic Signature(s) Signed: 03/25/2019 4:44:13 PM By: Harold Barban Entered By: Harold Barban on 03/25/2019 12:56:15 Disano, [name] ([id number]) -------------------------------------------------------------------------------- Multi Wound Chart Details Patient Name: [name], [name]. Date of Service: 03/25/2019 12:45 PM Medical Record Number: [id number] Patient Account Number: [id number] Date of Birth/Sex: [birth year] (73 y.o. F) Treating RN: Cornell Barman Primary Care Kelina Beauchamp: [name] Other Clinician: Referring Melesio Madara: [name] Treating Archer Vise/Extender: Beverly Gust in Treatment: 15 Vital Signs Height(in): 63 Pulse(bpm): 72 Weight(lbs): 224.7 Blood Pressure(mmHg): 154/78 Body Mass Index(BMI): 40 Temperature(F): 98.1 Respiratory Rate 18 (breaths/min): Photos: [N/A:N/A] Wound Location: Left Lower Leg - Medial Left Lower Leg - Lateral N/A Wounding Event: Gradually Appeared Gradually Appeared  N/A Primary Etiology: Lymphedema Venous Leg Ulcer N/A Comorbid History: Cataracts, Asthma, Sleep Cataracts, Asthma, Sleep N/A Apnea, Deep Vein Apnea, Deep Vein Thrombosis, Hypertension, Thrombosis, Hypertension, Peripheral Venous Disease, Peripheral Venous Disease, Osteoarthritis, Received Osteoarthritis, Received Chemotherapy, Received Chemotherapy, Received Radiation Radiation Date Acquired: 11/19/2018 01/19/2019 N/A Weeks of Treatment: 15 9 N/A Wound Status: Open Open N/A Measurements L x W x D 2.5x2.5x0.4 2x3.5x0.5 N/A (cm) Area (cm) : 4.909 5.498 N/A Volume (cm) : 1.963 2.749 N/A % Reduction in Area: 42.60% -2399.10% N/A % Reduction in Volume: -129.60% -12395.50% N/A Classification: Full Thickness Without  Full Thickness Without N/A Exposed Support Structures Exposed Support Structures Exudate Amount: Medium Medium N/A Exudate Type: Serosanguineous Serous N/A Exudate Color: red, brown amber N/A Wound Margin: Indistinct, nonvisible Flat and Intact N/A Granulation Amount: Small (1-33%) None Present (0%) N/A Granulation Quality: Red, Pink N/A N/A Necrotic Amount: Large (67-100%) Large (67-100%) N/A Exposed Structures: Fat Layer (Subcutaneous Fat Layer (Subcutaneous N/A Tissue) Exposed: Yes Tissue) Exposed: Yes Fascia: No Fascia: No Tendon: No Tendon: No Muscle: No Muscle: No Joint: No Joint: No Bone: No Bone: No Epithelialization: None None N/A MARIETTA, SIKKEMA ([id number]) Treatment Notes Electronic Signature(s) Signed: 03/25/2019 5:05:53 PM By: [name], [name], RN, CWS, [name], [name] Entered By: [name], [name], RN, CWS, Kim on 03/25/2019 13:06:20 [name] ([id number]) -------------------------------------------------------------------------------- Multi-Disciplinary Care Plan Details Patient Name: [name], [name]. Date of Service: 03/25/2019 12:45 PM Medical Record Number: [id number] Patient Account Number: [id number] Date of Birth/Sex: [birth year] (73 y.o. F) Treating  RN: Cornell Barman Primary Care Davionne Mastrangelo: [name] Other Clinician: Referring Edsel Shives: [name] Treating Tonda Wiederhold/Extender: Beverly Gust in Treatment: 15 Active Inactive Orientation to the Wound Care Program Nursing Diagnoses: Knowledge deficit related to the wound healing center program Goals: Patient/caregiver will verbalize understanding of the [facility] Program Date Initiated: 12/10/2018 Target Resolution Date: 01/09/2019 Goal Status: Active Interventions: Provide education on orientation to the wound center Notes: Soft Tissue Infection Nursing Diagnoses: Impaired tissue integrity Goals: Patient's soft tissue infection will resolve Date Initiated: 12/10/2018 Target Resolution Date: 01/09/2019 Goal Status: Active Interventions: Assess signs and symptoms of infection every visit Notes: Venous Leg Ulcer Nursing Diagnoses: Actual venous Insuffiency (use after diagnosis is confirmed) Goals: Patient will maintain optimal edema control Date Initiated: 12/10/2018 Target Resolution Date: 01/09/2019 Goal Status: Active Interventions: Assess peripheral edema status every visit. Treatment Activities: Therapeutic compression applied : 12/10/2018 Notes: Wound/Skin Impairment DAVANNA, HE ([id number]) Nursing Diagnoses: Impaired tissue integrity Goals: Patient/caregiver will verbalize understanding of skin care regimen Date Initiated: 12/10/2018 Target Resolution Date: 01/09/2019 Goal Status: Active Interventions: Assess ulceration(s) every visit Treatment Activities: Topical wound management initiated : 12/10/2018 Notes: Electronic Signature(s) Signed: 03/25/2019 5:05:53 PM By: [name], [name], RN, CWS, [name], [name] Entered By: [name], [name], RN, CWS, Kim on 03/25/2019 13:06:01 ROSHAUNDA, STARKEY ([id number]) -------------------------------------------------------------------------------- Pain Assessment Details Patient Name: DEBRALEE, BRAAKSMA. Date of Service:  03/25/2019 12:45 PM Medical Record Number: [id number] Patient Account Number: [id number] Date of Birth/Sex: 1946/03/25 (73 y.o. F) Treating RN: Harold Barban Primary Care Dorris Vangorder: [name] Other Clinician: Referring Ibrohim Simmers: [name] Treating Asmaa Tirpak/Extender: Beverly Gust in Treatment: 15 Active Problems Location of Pain Severity and Description of Pain Patient Has Paino Yes Site Locations Rate the pain. Current Pain Level: 5 Pain Management and Medication Current Pain Management: Electronic Signature(s) Signed: 03/25/2019 4:44:13 PM By: Harold Barban Entered By: Harold Barban on 03/25/2019 12:48:32 Whetsell, [name] ([id number]) -------------------------------------------------------------------------------- Patient/Caregiver Education Details Patient Name: IVELIS, NORGARD. Date of Service: 03/25/2019 12:45 PM Medical Record Number: [id number] Patient Account Number: [id number] Date of Birth/Gender: 1946-07-18 (73 y.o. F) Treating RN: Cornell Barman Primary Care Physician: [name] Other Clinician: Referring Physician: [name] Treating Physician/Extender: Beverly Gust in Treatment: 15 Education Assessment Education Provided To: Patient Education Topics Provided Wound/Skin Impairment: Handouts: Caring for Your Ulcer Methods: Demonstration, Explain/Verbal Responses: State content correctly Electronic Signature(s) Signed: 03/25/2019 5:05:53 PM By: [name], [name], RN, CWS, [name], [name] Entered By: [name], [name], RN, CWS, Kim on 03/25/2019 13:16:30 [name] ([id number]) -------------------------------------------------------------------------------- Wound Assessment Details Patient Name: SKOLNICK,  [name] Date of Service: 03/25/2019 12:45 PM Medical Record Number: [id number] Patient Account Number: [id number] Date of Birth/Sex: [birth year] (73 y.o. F) Treating RN: Harold Barban Primary Care Corynne Scibilia: [name] Other  Clinician: Referring Brettany Sydney: [name] Treating Sharisa Toves/Extender: Beverly Gust in Treatment: 15 Wound Status Wound Number: 5 Primary Lymphedema Etiology: Wound Location: Left Lower Leg - Medial Wound Open Wounding Event: Gradually Appeared Status: Date Acquired: 11/19/2018 Comorbid Cataracts, Asthma, Sleep Apnea, Deep Vein Weeks Of Treatment: 15 History: Thrombosis, Hypertension, Peripheral Venous Clustered Wound: No Disease, Osteoarthritis, Received Chemotherapy, Received Radiation Photos Wound Measurements Length: (cm) 2.5 Width: (cm) 2.5 Depth: (cm) 0.4 Area: (cm) 4.909 Volume: (cm) 1.963 % Reduction in Area: 42.6% % Reduction in Volume: -129.6% Epithelialization: None Tunneling: No Undermining: No Wound Description Full Thickness Without Exposed Support Classification: Structures Wound Margin: Indistinct, nonvisible Exudate Medium Amount: Exudate Type: Serosanguineous Exudate Color: red, brown Foul Odor After Cleansing: No Slough/Fibrino Yes Wound Bed Granulation Amount: Small (1-33%) Exposed Structure Granulation Quality: Red, Pink Fascia Exposed: No Necrotic Amount: Large (67-100%) Fat Layer (Subcutaneous Tissue) Exposed: Yes Necrotic Quality: Adherent Slough Tendon Exposed: No Muscle Exposed: No Joint Exposed: No Bone Exposed: No Electronic Signature(s) Signed: 03/25/2019 4:44:13 PM By: Harold Barban Entered By: Harold Barban on 03/25/2019 12:56:56 Wendt, [name] ([id number]) -------------------------------------------------------------------------------- Wound Assessment Details Patient Name: [name], [name]. Date of Service: 03/25/2019 12:45 PM Medical Record Number: [id number] Patient Account Number: [id number] Date of Birth/Sex: Mar 31, 1946 (73 y.o. F) Treating RN: Harold Barban Primary Care Kairee Kozma: [name] Other Clinician: Referring Kelina Beauchamp: [name] Treating Lavayah Vita/Extender: Beverly Gust in Treatment: 15 Wound Status Wound Number: 6 Primary Venous Leg Ulcer Etiology: Wound Location: Left Lower Leg - Lateral Wound Open Wounding Event: Gradually Appeared Status: Date Acquired: 01/19/2019 Comorbid Cataracts, Asthma, Sleep Apnea, Deep Vein Weeks Of Treatment: 9 History: Thrombosis, Hypertension, Peripheral Venous Clustered Wound: No Disease, Osteoarthritis, Received Chemotherapy, Received Radiation Photos Wound Measurements Length: (cm) 2 Width: (cm) 3.5 Depth: (cm) 0.5 Area: (cm) 5.498 Volume: (cm) 2.749 % Reduction in Area: -2399.1% % Reduction in Volume: -12395.5% Epithelialization: None Tunneling: No Undermining: No Wound Description Full Thickness Without Exposed Support Classification: Structures Wound Margin: Flat and Intact Exudate Medium Amount: Exudate Type: Serous Exudate Color: amber Foul Odor After Cleansing: No Slough/Fibrino Yes Wound Bed Granulation Amount: None Present (0%) Exposed Structure Necrotic Amount: Large (67-100%) Fascia Exposed: No Necrotic Quality: Adherent Slough Fat Layer (Subcutaneous Tissue) Exposed: Yes Tendon Exposed: No Muscle Exposed: No Joint Exposed: No Bone Exposed: No Electronic Signature(s) Signed: 03/25/2019 4:44:13 PM By: Harold Barban Entered By: Harold Barban on 03/25/2019 12:57:23 Brouhard, [name] ([id number]) -------------------------------------------------------------------------------- [place] Details Patient Name: ANDRIAN, [name]. Date of Service: 03/25/2019 12:45 PM Medical Record Number: [id number] Patient Account Number: [id number] Date of Birth/Sex: February 28, 1946 (73 y.o. F) Treating RN: Harold Barban Primary Care Mackenzey Crownover: [name] Other Clinician: Referring Jeanette Rauth: [name] Treating Eliberto Sole/Extender: Beverly Gust in Treatment: 15 Vital Signs Time Taken: 12:50 Temperature (F): 98.1 Height (in): 63 Pulse (bpm): 76 Weight (lbs):  224.7 Respiratory Rate (breaths/min): 18 Body Mass Index (BMI): 39.8 Blood Pressure (mmHg): 154/78 Reference Range: 80 - 120 mg / dl Electronic Signature(s) Signed: 03/25/2019 4:44:13 PM By: Harold Barban Entered By: Harold Barban on 03/25/2019 12:51:34

## 2019-03-29 ENCOUNTER — Encounter: Payer: Self-pay | Admitting: Family Medicine

## 2019-03-29 NOTE — Progress Notes (Signed)
Received call from team health. Stated patient called in with worsening of her chronic venous stasis. She tried calling wound care but they were not available. Currently has wrappings in place. No reported fevers, chills, chest pain, or shortness of breath.   I recommend that she take an extra dose of lasix and continue her conservative management with leg elevation and salt avoidance. Advised close follow up with PCP or wound car e and discussed ER precautions.

## 2019-03-31 ENCOUNTER — Telehealth: Payer: Self-pay

## 2019-03-31 NOTE — Telephone Encounter (Signed)
[facility] Patient Name: [name] Gender: Female DOB: 04/12/1946 Age: 73 Y 90 M 21 D Return Phone Number: [phone] (Secondary) Address: City/State/Zip: [name] [address] Client [name] Primary Care [name] Night - Client Client Site [name] Physician [name] - MD Contact Type Call Who Is Calling Patient / Member / Family / Caregiver Call Type Triage / Clinical Relationship To Patient Self Return Phone Number [phone] (Secondary) Chief Complaint Leg Swelling And Edema Reason for Call Symptomatic / Request for [name] states she is under the care at the wound center for venous ulcers on her left leg . This past Wednesday they wrapped it but today her leg is swollen above the wrap and her knee is giving out when she try's to walk . Translation No Nurse Assessment Nurse: Amber Pepper, RN, [name] Date/Time [name] Time): 03/29/2019 9:46:13 PM Confirm and document reason for call. If symptomatic, describe symptoms. ---Caller states she is under the care at the wound center for venous ulcers on her left leg . This past Wednesday they wrapped it but today her leg is swollen above the wrap and her knee is giving out when she try's to walk. States this evening it got very tight, elevated and swelling didn't go down. States her leg is just bulging. Called wound center, but are closed til Wednesday. States in past had problems and was told to unwrap the top wrap. Has the patient had close contact with a person known or suspected to have the novel coronavirus illness OR traveled / lives in area with major community spread (including international travel) in the last 14 days from the onset of symptoms? * If Asymptomatic, screen for exposure and travel within the last 14 days. ---No Does the patient have  any new or worsening symptoms? ---Yes Will a triage be completed? ---Yes Related visit to physician within the last 2 weeks? ---No Does the PT have any chronic conditions? (i.e. diabetes, asthma, this includes High risk factors for pregnancy, etc.) ---Yes List chronic conditions. ---Asthma, HTN, venous ulcers, sleep apnea, neuropathy Is this a behavioral health or substance abuse call? ---No PLEASE NOTE: All timestamps contained within this report are represented as [place] Standard Time. CONFIDENTIALTY NOTICE: This fax transmission is intended only for the addressee. It contains information that is legally privileged, confidential or otherwise protected from use or disclosure. If you are not the intended recipient, you are strictly prohibited from reviewing, disclosing, copying using or disseminating any of this information or taking any action in reliance on or regarding this information. If you have received this fax in error, please notify us immediately by telephone so that we can arrange for its return to [place]. Phone: [phone], Toll-Free: [phone], Fax: [phone] Page: 2 of 2 Call Id: [id number] Guidelines Guideline Title Affirmed Question Affirmed Notes Nurse Date/Time [name] Time) Leg Swelling and Edema Patient sounds very sick or weak to the triager [name] 03/29/2019 9:48:38 PM Disp. Time [name] Time) Disposition Final User 03/29/2019 9:53:56 PM Called On-Call Provider Amber Pepper, RN, [name] 03/29/2019 9:50:45 PM Go to ED Now (or PCP triage) Yes Amber Pepper, RN, [name] Disagree/Comply Comply Caller Understands Yes PreDisposition Did not know what to do Care Advice Given Per Guideline GO TO ED NOW (OR PCP TRIAGE): * IF PCP SECOND-LEVEL TRIAGE REQUIRED: You may need to be seen. Your doctor (or NP/PA) will want to talk  with you to decide what's best. I'll page the provider on-call now. If you haven't heard from the provider (or me) within 30 minutes, go  directly to the [facility] at _____________ [place] given per Leg Swelling and Edema (Adult) guideline. Referrals GO TO FACILITY UNDECIDED Paging DoctorName Phone DateTime Result/Outcome Message Type Notes [name]- MD [phone] 03/29/2019 9:53:56 PM Called On Call Provider - Reached Doctor Paged [name]- MD 03/29/2019 9:55:52 PM Spoke with On Call - Bison states take an extra Lasix 40mg  pill, elevate it, avoid sodium.

## 2019-03-31 NOTE — Telephone Encounter (Signed)
Patient had communication with "epic on hand" provider (Dr. Jerline Pain) who consulted and gave instructions on 03/29/19.   Spoke with patient for an update on her condition.  She states swelling has much improved and she is following up with wound care center tomorrow for her weekly dressing changes.   She thanks [place] so much for the call and felt very supported by our on call service.

## 2019-03-31 NOTE — Telephone Encounter (Signed)
Noted! Thank you

## 2019-04-01 ENCOUNTER — Encounter: Payer: Medicare Other | Admitting: Internal Medicine

## 2019-04-02 ENCOUNTER — Other Ambulatory Visit: Payer: Self-pay | Admitting: Orthopedic Surgery

## 2019-04-02 ENCOUNTER — Other Ambulatory Visit: Payer: Self-pay

## 2019-04-02 DIAGNOSIS — L97229 Non-pressure chronic ulcer of left calf with unspecified severity: Secondary | ICD-10-CM | POA: Diagnosis not present

## 2019-04-02 DIAGNOSIS — L97221 Non-pressure chronic ulcer of left calf limited to breakdown of skin: Secondary | ICD-10-CM | POA: Diagnosis not present

## 2019-04-02 DIAGNOSIS — I890 Lymphedema, not elsewhere classified: Secondary | ICD-10-CM | POA: Diagnosis not present

## 2019-04-02 DIAGNOSIS — I87332 Chronic venous hypertension (idiopathic) with ulcer and inflammation of left lower extremity: Secondary | ICD-10-CM | POA: Diagnosis not present

## 2019-04-02 DIAGNOSIS — M869 Osteomyelitis, unspecified: Secondary | ICD-10-CM

## 2019-04-02 DIAGNOSIS — M5136 Other intervertebral disc degeneration, lumbar region: Secondary | ICD-10-CM | POA: Diagnosis not present

## 2019-04-02 NOTE — Progress Notes (Signed)
[name], SHAVE ([id number]) Visit Report for 04/02/2019 Arrival Information Details Patient Name: [name], [name]. Date of Service: 04/02/2019 3:00 PM Medical Record Number: [id number] Patient Account Number: [id number] Date of Birth/Sex: [birth year] (73 y.o. F) Treating RN: Harold Barban Primary Care Braheem Tomasik: [name] Other Clinician: Referring Ashyra Cantin: [name] Treating Kaleesi Guyton/Extender: Melburn Hake, HOYT Weeks in Treatment: 16 Visit Information History Since Last Visit Added or deleted any medications: No Patient Arrived: Ambulatory Any new allergies or adverse reactions: No Arrival Time: 14:22 Had a fall or experienced change in No Accompanied By: self activities of daily living that may affect Transfer Assistance: None risk of falls: Patient Identification Verified: Yes Signs or symptoms of abuse/neglect since last visito No Secondary Verification Process Completed: Yes Hospitalized since last visit: No Has Dressing in Place as Prescribed: Yes Pain Present Now: No Electronic Signature(s) Signed: 04/02/2019 2:56:11 PM By: Harold Barban Entered By: Harold Barban on 04/02/2019 14:35:52 Deveny, [name] ([id number]) -------------------------------------------------------------------------------- Compression Therapy Details Patient Name: [name], [name]. Date of Service: 04/02/2019 3:00 PM Medical Record Number: [id number] Patient Account Number: [id number] Date of Birth/Sex: [birth year] (73 y.o. F) Treating RN: Harold Barban Primary Care Windell Musson: [name] Other Clinician: Referring Oluwatimileyin Vivier: [name] Treating Ambry Dix/Extender: STONE III, HOYT Weeks in Treatment: 16 Compression Therapy Performed for Wound Assessment: Wound #5 Left,Medial Lower Leg Performed By: Clinician Harold Barban, RN Compression Type: Four Layer Electronic Signature(s) Signed: 04/02/2019 2:56:11 PM By: Harold Barban Entered By: Harold Barban on 04/02/2019  14:37:51 Tait, [name] ([id number]) -------------------------------------------------------------------------------- Encounter Discharge Information Details Patient Name: [name], [name]. Date of Service: 04/02/2019 3:00 PM Medical Record Number: [id number] Patient Account Number: [id number] Date of Birth/Sex: [birth year] (73 y.o. F) Treating RN: Harold Barban Primary Care Aubree Doody: [name] Other Clinician: Referring Rondia Higginbotham: [name] Treating Isai Gottlieb/Extender: Melburn Hake, HOYT Weeks in Treatment: 16 Encounter Discharge Information Items Discharge Condition: Stable Ambulatory Status: Ambulatory Discharge Destination: Home Transportation: Private Auto Accompanied By: self Schedule Follow-up Appointment: Yes Clinical Summary of Care: Electronic Signature(s) Signed: 04/02/2019 2:56:11 PM By: Harold Barban Entered By: Harold Barban on 04/02/2019 14:39:12 Pomeroy, [name] ([id number]) -------------------------------------------------------------------------------- Wound Assessment Details Patient Name: [name], [name]. Date of Service: 04/02/2019 3:00 PM Medical Record Number: [id number] Patient Account Number: [id number] Date of Birth/Sex: [birth year] (73 y.o. F) Treating RN: Harold Barban Primary Care Salimata Christenson: [name] Other Clinician: Referring Catrinia Racicot: [name] Treating Mertha Clyatt/Extender: STONE III, HOYT Weeks in Treatment: 16 Wound Status Wound Number: 5 Primary Lymphedema Etiology: Wound Location: Left Lower Leg - Medial Wound Open Wounding Event: Gradually Appeared Status: Date Acquired: 11/19/2018 Comorbid Cataracts, Asthma, Sleep Apnea, Deep Vein Weeks Of Treatment: 16 History: Thrombosis, Hypertension, Peripheral Venous Clustered Wound: No Disease, Osteoarthritis, Received Chemotherapy, Received Radiation Photos Wound Measurements Length: (cm) 2.5 Width: (cm) 2.5 Depth: (cm) 0.4 Area: (cm) 4.909 Volume: (cm)  1.963 % Reduction in Area: 42.6% % Reduction in Volume: -129.6% Epithelialization: None Tunneling: No Undermining: No Wound Description Full Thickness Without Exposed Support Foul Od Classification: Structures Slough/ Wound Margin: Indistinct, nonvisible Exudate Medium Amount: Exudate Type: Serosanguineous Exudate Color: red, brown or After Cleansing: No Fibrino Yes Wound Bed Granulation Amount: Small (1-33%) Exposed Structure Granulation Quality: Red, Pink Fascia Exposed: No Necrotic Amount: Large (67-100%) Fat Layer (Subcutaneous Tissue) Exposed: Yes Necrotic Quality: Adherent Slough Tendon Exposed: No Muscle Exposed: No Joint Exposed: No Bone Exposed: No President, [name] ([id number]) Treatment Notes Wound #5 (Left, Medial Lower Leg) Notes sorbact, Drawatex, ABD, Unna to anchor, 4-Layer Left Electronic Signature(s) Signed: 04/02/2019 2:56:11  PM By: Harold Barban Entered By: Harold Barban on 04/02/2019 14:36:54 Fergeson, [name] ([id number]) -------------------------------------------------------------------------------- Wound Assessment Details Patient Name: [name], [name]. Date of Service: 04/02/2019 3:00 PM Medical Record Number: [id number] Patient Account Number: [id number] Date of Birth/Sex: 12/11/1945 (73 y.o. F) Treating RN: Harold Barban Primary Care Raju Coppolino: [name] Other Clinician: Referring Jasmyn Picha: [name] Treating Garry Nicolini/Extender: STONE III, HOYT Weeks in Treatment: 16 Wound Status Wound Number: 6 Primary Venous Leg Ulcer Etiology: Wound Location: Left Lower Leg - Lateral Wound Open Wounding Event: Gradually Appeared Status: Date Acquired: 01/19/2019 Comorbid Cataracts, Asthma, Sleep Apnea, Deep Vein Weeks Of Treatment: 10 History: Thrombosis, Hypertension, Peripheral Venous Clustered Wound: No Disease, Osteoarthritis, Received Chemotherapy, Received Radiation Photos Wound Measurements Length: (cm) 2 Width: (cm)  3.5 Depth: (cm) 0.5 Area: (cm) 5.498 Volume: (cm) 2.749 % Reduction in Area: -2399.1% % Reduction in Volume: -12395.5% Epithelialization: None Tunneling: No Undermining: No Wound Description Full Thickness Without Exposed Support Foul Odo Classification: Structures Slough/F Wound Margin: Flat and Intact Exudate Medium Amount: Exudate Type: Serous Exudate Color: amber r After Cleansing: No ibrino Yes Wound Bed Granulation Amount: None Present (0%) Exposed Structure Necrotic Amount: Large (67-100%) Fascia Exposed: No Necrotic Quality: Adherent Slough Fat Layer (Subcutaneous Tissue) Exposed: Yes Tendon Exposed: No Muscle Exposed: No Joint Exposed: No Bone Exposed: No Cruzen, Joana J. ([id number]) Treatment Notes Wound #6 (Left, Lateral Lower Leg) Notes sorbact, Drawatex, ABD, Unna to anchor, 4-Layer Left Electronic Signature(s) Signed: 04/02/2019 2:56:11 PM By: Harold Barban Entered By: Harold Barban on 04/02/2019 14:37:16

## 2019-04-08 ENCOUNTER — Encounter: Payer: Medicare Other | Attending: Internal Medicine | Admitting: Internal Medicine

## 2019-04-08 ENCOUNTER — Other Ambulatory Visit: Payer: Self-pay

## 2019-04-08 DIAGNOSIS — I890 Lymphedema, not elsewhere classified: Secondary | ICD-10-CM | POA: Insufficient documentation

## 2019-04-08 DIAGNOSIS — L97221 Non-pressure chronic ulcer of left calf limited to breakdown of skin: Secondary | ICD-10-CM | POA: Diagnosis not present

## 2019-04-08 DIAGNOSIS — L97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d: Secondary | ICD-10-CM | POA: Diagnosis not present

## 2019-04-08 DIAGNOSIS — I87312 Chronic venous hypertension (idiopathic) with ulcer of left lower extremity: Secondary | ICD-10-CM | POA: Diagnosis not present

## 2019-04-08 DIAGNOSIS — L97222 Non-pressure chronic ulcer of left calf with fat layer exposed: Secondary | ICD-10-CM | POA: Diagnosis not present

## 2019-04-08 DIAGNOSIS — I87332 Chronic venous hypertension (idiopathic) with ulcer and inflammation of left lower extremity: Secondary | ICD-10-CM | POA: Insufficient documentation

## 2019-04-08 NOTE — Progress Notes (Signed)
DECIE, VERNE ([id number]) Visit Report for 04/08/2019 Debridement Details Patient Name: [name], [name]. Date of Service: 04/08/2019 12:45 PM Medical Record Number: [id number] Patient Account Number: [id number] Date of Birth/Sex: 1946-03-04 (73 y.o. F) Treating RN: Cornell Barman Primary Care Provider: [name] Other Clinician: Referring Provider: [name] Treating Provider/Extender: Tito Dine in Treatment: 17 Debridement Performed for Wound #5 Left,Medial Lower Leg Assessment: Performed By: Physician [name] Dillon, MD Debridement Type: Debridement Level of Consciousness (Pre- Awake and Alert procedure): Pre-procedure Verification/Time Yes - 13:02 Out Taken: Start Time: 13:02 Pain Control: Lidocaine Total Area Debrided (L x W): 3.2 (cm) x 2.5 (cm) = 8 (cm) Tissue and other material Viable, Non-Viable, Slough, Subcutaneous, Slough debrided: Level: Skin/Subcutaneous Tissue Debridement Description: Excisional Instrument: Curette Bleeding: Moderate Hemostasis Achieved: Pressure End Time: 13:03 Response to Treatment: Procedure was tolerated well Level of Consciousness Awake and Alert (Post-procedure): Post Debridement Measurements of Total Wound Length: (cm) 3.2 Width: (cm) 2.5 Depth: (cm) 0.5 Volume: (cm) 3.142 Character of Wound/Ulcer Post Debridement: Stable Post Procedure Diagnosis Same as Pre-procedure Electronic Signature(s) Signed: 04/08/2019 4:12:25 PM By: Linton Ham MD Signed: 04/08/2019 4:50:33 PM By: [name], [name], RN, CWS, [name], [name] Entered By: Linton Ham on 04/08/2019 13:18:01 [place], [name] ([id number]) -------------------------------------------------------------------------------- Debridement Details Patient Name: [name], [name]. Date of Service: 04/08/2019 12:45 PM Medical Record Number: [id number] Patient Account Number: [id number] Date of Birth/Sex: [birth year] (73 y.o. F) Treating RN: Cornell Barman Primary Care  Provider: [name] Other Clinician: Referring Provider: [name] Treating Provider/Extender: Tito Dine in Treatment: 17 Debridement Performed for Wound #6 Left,Lateral Lower Leg Assessment: Performed By: Physician [name] Dillon, MD Debridement Type: Debridement Severity of Tissue Pre Fat layer exposed Debridement: Level of Consciousness (Pre- Awake and Alert procedure): Pre-procedure Verification/Time Yes - 13:02 Out Taken: Start Time: 13:02 Pain Control: Lidocaine Total Area Debrided (L x W): 2.5 (cm) x 3.5 (cm) = 8.75 (cm) Tissue and other material Viable, Non-Viable, Slough, Subcutaneous, Slough debrided: Level: Skin/Subcutaneous Tissue Debridement Description: Excisional Instrument: Curette Specimen: Tissue Culture Number of Specimens Taken: 1 Bleeding: Moderate Hemostasis Achieved: Pressure End Time: 13:03 Response to Treatment: Procedure was tolerated well Level of Consciousness Awake and Alert (Post-procedure): Post Debridement Measurements of Total Wound Length: (cm) 2.5 Width: (cm) 3.5 Depth: (cm) 0.6 Volume: (cm) 4.123 Character of Wound/Ulcer Post Debridement: Stable Severity of Tissue Post Debridement: Fat layer exposed Post Procedure Diagnosis Same as Pre-procedure Electronic Signature(s) Signed: 04/08/2019 4:12:25 PM By: Linton Ham MD Signed: 04/08/2019 4:50:33 PM By: [name], [name], RN, CWS, [name], [name] Entered By: Linton Ham on 04/08/2019 13:18:16 [place], [name] ([id number]) -------------------------------------------------------------------------------- HPI Details Patient Name: [name], [name]. Date of Service: 04/08/2019 12:45 PM Medical Record Number: [id number] Patient Account Number: [id number] Date of Birth/Sex: 04-22-1946 (73 y.o. F) Treating RN: Cornell Barman Primary Care Provider: [name] Other Clinician: Referring Provider: [name] Treating Provider/Extender: Tito Dine in Treatment: 17 History of Present Illness HPI Description: Pleasant 73 year old with history of chronic venous insufficiency. No diabetes or peripheral vascular disease. Left ABI 1.29. Questionable history of left lower extremity DVT. She developed a recurrent ulceration on her left lateral calf in December 2015, which she attributes to poor diet and subsequent lower extremity edema. She underwent endovenous laser ablation of her left greater saphenous vein in 2010. She underwent laser ablation of accessory branch of left GSV in April 2016 by Dr. Kellie Simmering at [facility]. She was previously wearing Unna boots, which she  tolerated well. Tolerating 2 layer compression and cadexomer iodine. She returns to clinic for follow-up and is without new complaints. She denies any significant pain at this time. She reports persistent pain with pressure. No claudication or ischemic rest pain. No fever or chills. No drainage. READMISSION 11/13/16; this is a 74 year old woman who is not a diabetic. She is here for a review of a painful area on her left medial lower extremity. I note that she was seen here previously last year for wound I believe to be in the same area. At that time she had undergone previously a left greater saphenous vein ablation by Dr. Kellie Simmering and she had a ablation of the anterior accessory branch of the left greater saphenous vein in March 2016. Seeing that the wound actually closed over. In reviewing the history with her today the ulcer in this area has been recurrent. She describes a biopsy of this area in 2009 that only showed stasis physiology. 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 Do not see that she has ever had arterial studies in the left leg. She tells me  she has a follow-up with Dr. Kellie Simmering in roughly 10 days In any case she developed the reopening of this area roughly a month ago. On the background of this she describes rapidly increasing edema which has responded to Lasix 40 mg and metolazone 2.5 mg as well as the patient's lymph massage. She has been told she has both venous insufficiency and lymphedema but she cannot tolerate compression stockings 11/28/16; the patient saw Dr. Kellie Simmering recently. Per the patient he did arterial Dopplers in the office that did not show evidence of arterial insufficiency, per the patient he stated "treat this like an ordinary venous ulcer". She also saw her dermatologist Dr. Ronnald Ramp who felt that this was more of a vascular ulcer. In general things are improving although she arrives today with increasing bilateral lower extremity edema with weeping a deeper fluid through the wound on the left medial leg compatible with some degree of lymphedema 12/04/16; the patient's wound is fully epithelialized but I don't think fully healed. We will do another week of depression with Promogran and TCA however I suspect we'll be able to discharge her next week. This is a very unusual-looking wound which was initially a figure-of-eight type wound lying on its side surrounded by petechial like hemorrhage. She has had venous ablation on this side. She apparently does not have an arterial issue per Dr. Kellie Simmering. She saw her dermatologist thought it was "vascular". Patient is definitely going to need ongoing compression and I talked about this with her today she will go to elastic therapy after she leaves here next week 12/11/16; the patient's wound is not completely closed today. She has surrounding scar tissue and in further discussion with the patient it would appear that she had ulcers in this area in 2009 for a prolonged period of time ultimately requiring a punch biopsy of this area that only showed venous insufficiency. I did not  previously pickup on this part of the history from the patient. 12/18/16; the patient's wound is completely epithelialized. There is no open area here. She has significant bilateral venous insufficiency with secondary lymphedema to a mild-to-moderate degree she does not have compression stockings.. She  did not say anything to me when I was in the room, she told our intake nurse that she was still having pain in this area. This isn't unusual recurrent small open area. She is going to go to elastic therapy to obtain compression stockings. 12/25/16; the patient's wound is fully epithelialized. There is no open area here. The patient describes some continued episodic discomfort in this area medial left calf. However everything looks fine and healed here. She is been to elastic therapy and caught herself 15-20 mmHg stockings, they apparently were having trouble getting 20-30 mm stockings in her size NAMRATA, DANGLER ([id number]) 01/22/17; this is a patient we discharged from the clinic a month ago. She has a recurrent open wound on her medial left calf. She had 15 mm support stockings. I told her I thought she needed 20-30 mm compression stockings. She tells me that she has been ill with hospitalization secondary to asthma and is been found to have severe hypokalemia likely secondary to a combination of Lasix and metolazone. This morning she noted blistering and leaking fluid on the posterior part of her left leg. She called our intake nurse urgently and we was saw her this afternoon. She has not had any real discomfort here. I don't know that she's been wearing any stockings on this leg for at least 2-3 days. ABIs in this clinic were 1.21 on the right and 1.3 on the left. She is previously seen vascular surgery who does not think that there is a peripheral arterial issue. 01/30/17; Patient arrives with no open wound on the left leg. She has been to elastic therapy and obtained 20-63mmhg below knee stockings  and she has one on the right leg today. READMISSION 02/19/18; this Rathel is a now 73 year old patient we've had in this clinic perhaps 3 times before. I had last looked at her from January 07 December 2016 with an area on the medial left leg. We discharged her on 12/25/16 however she had to be readmitted on 01/22/17 with a recurrence. I have in my notes that we discharged her on 20-30 mm stockings although she tells me she was only wearing support hose because she cannot get stockings on predominantly related to her cervical spine surgery/issues. She has had previous ablations done by vein and vascular in [place] including a great saphenous vein ablation on the left with an anterior accessory branch ablation I think both of these were in 2016. On one of the previous visit she had a biopsy noted 2009 that was negative. She is not felt to have an arterial issue. She is not a diabetic. She does have a history of obstructive sleep apnea hypertension asthma as well as chronic venous insufficiency and lymphedema. On this occasion she noted 2 dry scaly patch on her left leg. She tried to put lotion on this it didn't really help. There were 2 open areas.the patient has been seeing her primary physician from 02/05/18 through 02/14/18. She had Unna boots applied. The superior wound now on the lateral left leg has closed but she's had one wound that remains open on the lateral left leg. This is not the same spot as we dealt with in 2018. ABIs in this clinic were 1.3 bilaterally 02/26/18; patient has a small wound on the left lateral calf. Dimensions are down. She has chronic venous insufficiency and lymphedema. 03/05/18; small open area on the left lateral calf. Dimensions are down. Tightly adherent necrotic debris over the surface of the wound which was  difficult to remove. Also the dressing [over collagen] stuck to the wound surface. This was removed with some difficulty as well. Change the primary dressing to  Hydrofera Blue ready 03/12/18; small open area on the left lateral calf. Comes in with tightly adherent surface eschar as well as some adherent Hydrofera Blue. 03/19/18; open area on the left lateral calf. Again adherent surface eschar as well as some adherent Hydrofera Blue nonviable subcutaneous tissue. She complained of pain all week even with the reduction from 4-3 layer compression I put on last week. Also she had an increase in her ankle and calf measurements probably related to the same thing. 03/26/18; open area on the left lateral calf. A very small open area remains here. We used silver alginate starting last week as the Hydrofera Blue seem to stick to the wound bed. In using 4-layer compression 04/02/18; the open area in the left lateral calf at some adherent slough which I removed there is no open area here. We are able to transition her into her own compression stocking. Truthfully I think this is probably his support hose. However this does not maintain skin integrity will be limited. She cannot put over the toe compression stockings on because of neck problems hand problems etc. She is allergic to the lining layer of juxta lites. We might be forced to use extremitease stocking should this fail READMIT 11/24/2018 Patient is now a 73 year old woman who is not a diabetic. She has been in this clinic on at least 3 previous occasions largely with recurrent wounds on her left leg secondary to chronic venous insufficiency with secondary lymphedema. Her situation is complicated by inability to get stockings on and an allergy to neoprene which is apparently a component and at least juxta lites and other stockings. As a result she really has not been wearing any stockings on her legs. She tells [place] that roughly 2 or 3 weeks ago she started noticing a stinging sensation just above her ankle on the left medial aspect. She has been diagnosed with pseudogout and she wondered whether this was what she  was experiencing. She tried to dress this with something she bought at the store however subsequently it pulled skin off and now she has an open wound that is not improving. She has been using Vaseline gauze with a cover bandage. She saw her primary doctor last week who put an Haematologist on her. ABIs in this clinic was 1.03 on the left HIAWATHA, DRESSEL. ([id number]) 2/12; the area is on the left medial ankle. Odd-looking wound with what looks to be surface epithelialization but a multitude of small petechial openings. This clearly not closed yet. We have been using silver alginate under 3 layer compression with TCA 2/19; the wound area did not look quite as good this week. Necrotic debris over the majority of the wound surface which required debridement. She continues to have a multitude of what looked to be small petechial openings. She reminds [place] that she had a biopsy on this initially during her first outbreak in 2015 in [place] dermatology. She expresses concern about this being a possible melanoma. 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 I assured her that this does not look anything like melanoma. Except for the petechial reaction it does look like a venous insufficiency area and she certainly has evidence of this on both sides 2/26; a difficult area on the left medial  ankle. The patient clearly has chronic venous hypertension with some degree of lymphedema. The odd thing about the area is the small petechial hemorrhages. I am not really sure how to explain this. This was present last time and this is not a compression injury. We have been using Hydrofera Blue which I changed to last week 3/4; still using Hydrofera Blue. Aggressive debridement today. She does not have known arterial issues. She has seen Dr. Kellie Simmering at [facility] vein [facility] vascular and and has an ablation on the left. [Anterior accessory  branch of the greater saphenous]. From what I remember they did not feel she had an arterial issue. The patient has had this area biopsied in 2009 at [facility] dermatology and by her recollection they said this was "stasis". She is also follow-up with dermatology locally who thought that this was more of a vascular issue 3/11; using Hydrofera Blue. Aggressive debridement today. She does not have an arterial issue. We are using 3 layer compression although we may need to go to 4. The patient has been in for multiple changes to her wrap since I last saw her a week ago. She says that the area was leaking. I do not have too much more information on what was found 01/19/19 on evaluation today patient was actually being seen for a nurse visit when unfortunately she had the area on her left lateral lower extremity as well as weeping from the right lower extremity that became apparent. Therefore we did end up actually seeing her for a full visit with myself. She is having some pain at this site as well but fortunately nothing too significant at this point. No fevers, chills, nausea, or vomiting noted at this time. 3/18-Patient is back to the clinic with the left leg venous leg ulcer, the ulcer is larger in size, has a surface that is densely adherent with fibrinous tissue, the Hydrofera Blue was used but is densely adherent and there was difficulty in removing it. The right lower extremity was also wrapped for weeping edema. Patient has a new area over the left lateral foot above the malleolus that is small and appears to have no debris with intact surrounding skin. Patient is on increased dose of Lasix also as a means to edema management 3/25; the patient has a nonhealing venous ulcer on the medial left leg and last week developed a smaller area on the lateral left calf. We have been using Hydrofera Blue with a contact layer. 4/1; no major change in these wounds areas. Left medial and more recently left  lateral calf. I tried Iodoflex last week to aid in debridement she did not tolerate this. She stated her pain was terrible all week. She took the top layer of the 4 layer compression off. 4/8; the patient actually looks somewhat better in terms of her more prominent left lateral calf wound. There is some healthy looking tissue here. She is still complaining of a lot of discomfort. 4/15; patient in a lot of pain secondary to sciatica. She is on a prednisone taper prescribed by her primary physician. She has the 2 areas one on the left medial and more recently a smaller area on the left lateral calf. Both of these just above the malleoli 4/22; her back pain is better but she still states she is very uncomfortable and now feels she is intolerant to the [employer]. No real change in the wounds we have been using Sorbact. She has been previously intolerant to Iodoflex. There is  not a lot of option about what we can use to debride this wound under compression that she no doubt needs. sHe states Ultram no longer works for her pain 4/29; no major change in the wounds slightly increased depth. Surface on the original medial wound perhaps somewhat improved however the more recent area on the lateral left ankle is 100% covered in very adherent debris we have been using Sorbact. She tolerates 4 layer compression well and her edema control is a lot better. She has not had to come in for a nurse check 5/6; no major change in the condition of the wounds. She did consent to debridement today which was done with some difficulty. Continuing Sorbact. She did not tolerate Iodoflex. She was in for a check of her compression the day after we wrapped her last week this was adjusted but nothing much was found 5/13; no major change in the condition or area of the wounds. I was able to get a fairly aggressive debridement done on the lateral left leg wound. Even using Sorbact under compression. She came back in on Friday to  have the wrap changed. She says she felt uncomfortable on the lateral aspect of her ankle. She has a long history of chronic venous insufficiency including previous ablation surgery on this side. 5/20-Patient returns for wounds on left leg with both wounds covered in slough, with the lateral leg wound larger in size, she has been in 3 layer compression and felt more comfortable, she describes pain in ankle, in leg and pins and needles in foot, and is about to try Pamelor for this 6/3; wounds on the left lateral and left medial leg. The area medially which is the most recent of the 2 seems to have had the largest increase in dimensions. We have been using Sorbac to try and debride the surface. She has been to see orthopedics they apparently did a plain x-ray that was indeterminant. Diagnosed her with neuropathy and they have ordered an MRI to [name], [name]. ([id number]) determine if there is underlying osteomyelitis. This was not high on my thought list but I suppose it is prudent. We have advised her to make an appointment with vein and vascular in [place]. She has a history of a left greater saphenous and accessory vein ablations I wonder if there is anything else that can be done from a surgical point of view to help in these difficult refractory wounds. We have previously healed this wound on one occasion but it keeps on reopening [medial side] Electronic Signature(s) Signed: 04/08/2019 4:12:25 PM By: Linton Ham MD Entered By: Linton Ham on 04/08/2019 13:20:13 LIOR, CARTELLI ([id number]) -------------------------------------------------------------------------------- Physical Exam Details Patient Name: [name], [name]. Date of Service: 04/08/2019 12:45 PM Medical Record Number: [id number] Patient Account Number: [id number] Date of Birth/Sex: 1946/04/14 (73 y.o. F) Treating RN: Cornell Barman Primary Care Provider: [name] Other Clinician: Referring Provider: [name] Treating Provider/Extender: Tito Dine in Treatment: 63 Constitutional Patient is hypertensive.. Pulse regular and within target range for patient.Marland Kitchen Respirations regular, non-labored and within target range.. Temperature is normal and within the target range for the patient.Marland Kitchen appears in no distress. Notes Wound exam; left lateral and medial distal lower legs. Copious amounts of adherent debris removed with a #5 curette this is not easy and very difficult as the patient tolerates that poorly. There is no overt cellulitis around the wounds nevertheless the area laterally appears to be increasing in size. I used a #3 curette  to obtain a tissue culture. Looking at her feet she has palpable pulses however a lot of dilated superficial veins indicative of venous hypertension Electronic Signature(s) Signed: 04/08/2019 4:12:25 PM By: Linton Ham MD Entered By: Linton Ham on 04/08/2019 13:24:52 Hisle, [name] ([id number]) -------------------------------------------------------------------------------- Physician Orders Details Patient Name: [name], [name]. Date of Service: 04/08/2019 12:45 PM Medical Record Number: [id number] Patient Account Number: [id number] Date of Birth/Sex: 10/09/1946 (73 y.o. F) Treating RN: Cornell Barman Primary Care Provider: [name] Other Clinician: Referring Provider: [name] Treating Provider/Extender: Tito Dine in Treatment: 74 Verbal / Phone Orders: No Diagnosis Coding Wound Cleansing Wound #5 Left,Medial Lower Leg o Clean wound with Normal Saline. o May Shower, gently pat wound dry prior to applying new dressing. Wound #6 Left,Lateral Lower Leg o Clean wound with Normal Saline. o May Shower, gently pat wound dry prior to applying new dressing. Anesthetic (add to Medication List) Wound #5 Left,Medial Lower Leg o Topical Lidocaine 4% cream applied to wound bed prior to debridement (In Clinic  Only). Wound #6 Left,Lateral Lower Leg o Topical Lidocaine 4% cream applied to wound bed prior to debridement (In Clinic Only). Skin Barriers/Peri-Wound Care Wound #5 Left,Medial Lower Leg o Other: - Antibiotic ointment on irritated areas Primary Wound Dressing Wound #5 Left,Medial Lower Leg o Other: - Sorbact Wound #6 Left,Lateral Lower Leg o Other: - Sorbact Secondary Dressing Wound #5 Left,Medial Lower Leg o ABD pad Wound #6 Left,Lateral Lower Leg o ABD pad Dressing Change Frequency Wound #5 Left,Medial Lower Leg o Change dressing every week Wound #6 Left,Lateral Lower Leg o Change dressing every week Follow-up Appointments [name], [name] ([id number]) Wound #5 Left,Medial Lower Leg o Return Appointment in 1 week. o Nurse Visit as needed Wound #6 Left,Lateral Lower Leg o Return Appointment in 1 week. o Nurse Visit as needed Edema Control Wound #5 Left,Medial Lower Leg o 4 Layer Compression System - Bilateral Wound #6 Left,Lateral Lower Leg o 4 Layer Compression System - Bilateral Off-Loading Wound #5 Left,Medial Lower Leg o Other: - Elevate legs as needed Wound #6 Left,Lateral Lower Leg o Other: - Elevate legs as needed Laboratory o Bacteria identified in Wound by Culture (MICRO) - Left lateral leg oooo LOINC Code: 8546-2 oooo Convenience Name: Wound culture routine Electronic Signature(s) Signed: 04/08/2019 4:12:25 PM By: Linton Ham MD Signed: 04/08/2019 4:50:33 PM By: [name], [name], RN, CWS, [name], [name] Entered By: [name], [name], RN, CWS, Kim on 04/08/2019 13:09:56 [name] ([id number]) -------------------------------------------------------------------------------- Problem List Details Patient Name: [name], [name]. Date of Service: 04/08/2019 12:45 PM Medical Record Number: [id number] Patient Account Number: [id number] Date of Birth/Sex: Dec 09, 1945 (73 y.o. F) Treating RN: Cornell Barman Primary Care Provider: [name]  Other Clinician: Referring Provider: [name] Treating Provider/Extender: Tito Dine in Treatment: 17 Active Problems ICD-10 Evaluated Encounter Code Description Active Date Today Diagnosis L97.221 Non-pressure chronic ulcer of left calf limited to breakdown of 01/07/2019 No Yes skin I87.321 Chronic venous hypertension (idiopathic) with inflammation of 12/10/2018 No Yes right lower extremity I89.0 Lymphedema, not elsewhere classified 12/10/2018 No Yes Inactive Problems Resolved Problems Electronic Signature(s) Signed: 04/08/2019 4:12:25 PM By: Linton Ham MD Entered By: Linton Ham on 04/08/2019 13:17:25 Salak, [name] ([id number]) -------------------------------------------------------------------------------- Progress Note Details Patient Name: [name], [name]. Date of Service: 04/08/2019 12:45 PM Medical Record Number: [id number] Patient Account Number: [id number] Date of Birth/Sex: [birth year] (73 y.o. F) Treating RN: Cornell Barman Primary Care Provider: [name] Other Clinician: Referring Provider: [name] Treating  Provider/Extender: [name] Dillon Weeks in Treatment: 17 Subjective History of Present Illness (HPI) Pleasant 73 year old with history of chronic venous insufficiency. No diabetes or peripheral vascular disease. Left ABI 1.29. Questionable history of left lower extremity DVT. She developed a recurrent ulceration on her left lateral calf in December 2015, which she attributes to poor diet and subsequent lower extremity edema. She underwent endovenous laser ablation of her left greater saphenous vein in 2010. She underwent laser ablation of accessory branch of left GSV in April 2016 by Dr. Kellie Simmering at [facility]. She was previously wearing Unna boots, which she tolerated well. Tolerating 2 layer compression and cadexomer iodine. She returns to clinic for follow-up and is without new complaints. She denies any significant pain at  this time. She reports persistent pain with pressure. No claudication or ischemic rest pain. No fever or chills. No drainage. READMISSION 11/13/16; this is a 73 year old woman who is not a diabetic. She is here for a review of a painful area on her left medial lower extremity. I note that she was seen here previously last year for wound I believe to be in the same area. At that time she had undergone previously a left greater saphenous vein ablation by Dr. Kellie Simmering and she had a ablation of the anterior accessory branch of the left greater saphenous vein in March 2016. Seeing that the wound actually closed over. In reviewing the history with her today the ulcer in this area has been recurrent. She describes a biopsy of this area in 2009 that only showed stasis physiology. 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 Do not see that she has ever had arterial studies in the left leg. She tells me she has a follow-up with Dr. Kellie Simmering in roughly 10 days In any case she developed the reopening of this area roughly a month ago. On the background of this she describes rapidly increasing edema which has responded to Lasix 40 mg and metolazone 2.5 mg as well as the patient's lymph massage. She has been told she has both venous insufficiency and lymphedema but she cannot tolerate compression stockings 11/28/16; the patient saw Dr. Kellie Simmering recently. Per the patient he did arterial Dopplers in the office that did not show evidence of arterial insufficiency, per the patient he stated "treat this like an ordinary venous ulcer". She also saw her dermatologist Dr. Ronnald Ramp who felt that this was more of a vascular ulcer. In general things are improving although she arrives today with increasing bilateral  lower extremity edema with weeping a deeper fluid through the wound on the left medial leg compatible with some degree of lymphedema 12/04/16; the patient's wound is fully epithelialized but I don't think fully healed. We will do another week of depression with Promogran and TCA however I suspect we'll be able to discharge her next week. This is a very unusual-looking wound which was initially a figure-of-eight type wound lying on its side surrounded by petechial like hemorrhage. She has had venous ablation on this side. She apparently does not have an arterial issue per Dr. Kellie Simmering. She saw her dermatologist thought it was "vascular". Patient is definitely going to need ongoing compression and I talked about this with her today she will go to elastic therapy after she leaves  here next week 12/11/16; the patient's wound is not completely closed today. She has surrounding scar tissue and in further discussion with the patient it would appear that she had ulcers in this area in 2009 for a prolonged period of time ultimately requiring a punch biopsy of this area that only showed venous insufficiency. I did not previously pickup on this part of the history from the patient. 12/18/16; the patient's wound is completely epithelialized. There is no open area here. She has significant bilateral venous insufficiency with secondary lymphedema to a mild-to-moderate degree she does not have compression stockings.. She did not say anything to me when I was in the room, she told our intake nurse that she was still having pain in this area. This isn't unusual recurrent small open area. She is going to go to elastic therapy to obtain compression stockings. 12/25/16; the patient's wound is fully epithelialized. There is no open area here. The patient describes some continued episodic discomfort in this area medial left calf. However everything looks fine and healed here. She is been to elastic therapy and [name], [name]  ([id number]) caught herself 15-20 mmHg stockings, they apparently were having trouble getting 20-30 mm stockings in her size 01/22/17; this is a patient we discharged from the clinic a month ago. She has a recurrent open wound on her medial left calf. She had 15 mm support stockings. I told her I thought she needed 20-30 mm compression stockings. She tells me that she has been ill with hospitalization secondary to asthma and is been found to have severe hypokalemia likely secondary to a combination of Lasix and metolazone. This morning she noted blistering and leaking fluid on the posterior part of her left leg. She called our intake nurse urgently and we was saw her this afternoon. She has not had any real discomfort here. I don't know that she's been wearing any stockings on this leg for at least 2-3 days. ABIs in this clinic were 1.21 on the right and 1.3 on the left. She is previously seen vascular surgery who does not think that there is a peripheral arterial issue. 01/30/17; Patient arrives with no open wound on the left leg. She has been to elastic therapy and obtained 20-30mmhg below knee stockings and she has one on the right leg today. READMISSION 02/19/18; this Bahner is a now 73 year old patient we've had in this clinic perhaps 3 times before. I had last looked at her from January 07 December 2016 with an area on the medial left leg. We discharged her on 12/25/16 however she had to be readmitted on 01/22/17 with a recurrence. I have in my notes that we discharged her on 20-30 mm stockings although she tells me she was only wearing support hose because she cannot get stockings on predominantly related to her cervical spine surgery/issues. She has had previous ablations done by vein and vascular in [place] including a great saphenous vein ablation on the left with an anterior accessory branch ablation I think both of these were in 2016. On one of the previous visit she had a biopsy noted 2009  that was negative. She is not felt to have an arterial issue. She is not a diabetic. She does have a history of obstructive sleep apnea hypertension asthma as well as chronic venous insufficiency and lymphedema. On this occasion she noted 2 dry scaly patch on her left leg. She tried to put lotion on this it didn't really help. There were 2 open areas.the patient  has been seeing her primary physician from 02/05/18 through 02/14/18. She had Unna boots applied. The superior wound now on the lateral left leg has closed but she's had one wound that remains open on the lateral left leg. This is not the same spot as we dealt with in 2018. ABIs in this clinic were 1.3 bilaterally 02/26/18; patient has a small wound on the left lateral calf. Dimensions are down. She has chronic venous insufficiency and lymphedema. 03/05/18; small open area on the left lateral calf. Dimensions are down. Tightly adherent necrotic debris over the surface of the wound which was difficult to remove. Also the dressing [over collagen] stuck to the wound surface. This was removed with some difficulty as well. Change the primary dressing to Hydrofera Blue ready 03/12/18; small open area on the left lateral calf. Comes in with tightly adherent surface eschar as well as some adherent Hydrofera Blue. 03/19/18; open area on the left lateral calf. Again adherent surface eschar as well as some adherent Hydrofera Blue nonviable subcutaneous tissue. She complained of pain all week even with the reduction from 4-3 layer compression I put on last week. Also she had an increase in her ankle and calf measurements probably related to the same thing. 03/26/18; open area on the left lateral calf. A very small open area remains here. We used silver alginate starting last week as the Hydrofera Blue seem to stick to the wound bed. In using 4-layer compression 04/02/18; the open area in the left lateral calf at some adherent slough which I removed there is no  open area here. We are able to transition her into her own compression stocking. Truthfully I think this is probably his support hose. However this does not maintain skin integrity will be limited. She cannot put over the toe compression stockings on because of neck problems hand problems etc. She is allergic to the lining layer of juxta lites. We might be forced to use extremitease stocking should this fail READMIT 11/24/2018 Patient is now a 73 year old woman who is not a diabetic. She has been in this clinic on at least 3 previous occasions largely with recurrent wounds on her left leg secondary to chronic venous insufficiency with secondary lymphedema. Her situation is complicated by inability to get stockings on and an allergy to neoprene which is apparently a component and at least juxta lites and other stockings. As a result she really has not been wearing any stockings on her legs. She tells [place] that roughly 2 or 3 weeks ago she started noticing a stinging sensation just above her ankle on the left medial aspect. She has been diagnosed with pseudogout and she wondered whether this was what she was experiencing. She tried to dress this with something she bought at the store however subsequently it pulled skin off and now she has an open wound that is not improving. She has been using Vaseline gauze with a cover bandage. She saw her primary doctor last week who put an Haematologist on her. ABIs in this clinic was 1.03 on the left [name], [name]. ([id number]) 2/12; the area is on the left medial ankle. Odd-looking wound with what looks to be surface epithelialization but a multitude of small petechial openings. This clearly not closed yet. We have been using silver alginate under 3 layer compression with TCA 2/19; the wound area did not look quite as good this week. Necrotic debris over the majority of the wound surface which required debridement. She continues to have  a multitude of what looked  to be small petechial openings. She reminds [place] that she had a biopsy on this initially during her first outbreak in 2015 in [facility]. She expresses concern about this being a possible melanoma. 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 I assured her that this does not look anything like melanoma. Except for the petechial reaction it does look like a venous insufficiency area and she certainly has evidence of this on both sides 2/26; a difficult area on the left medial ankle. The patient clearly has chronic venous hypertension with some degree of lymphedema. The odd thing about the area is the small petechial hemorrhages. I am not really sure how to explain this. This was present last time and this is not a compression injury. We have been using Hydrofera Blue which I changed to last week 3/4; still using Hydrofera Blue. Aggressive debridement today. She does not have known arterial issues. She has seen Dr. Kellie Simmering at [facility] vascular and and has an ablation on the left. [Anterior accessory branch of the greater saphenous]. From what I remember they did not feel she had an arterial issue. The patient has had this area biopsied in 2009 at [facility] dermatology and by her recollection they said this was "stasis". She is also follow-up with dermatology locally who thought that this was more of a vascular issue 3/11; using Hydrofera Blue. Aggressive debridement today. She does not have an arterial issue. We are using 3 layer compression although we may need to go to 4. The patient has been in for multiple changes to her wrap since I last saw her a week ago. She says that the area was leaking. I do not have too much more information on what was found 01/19/19 on evaluation today patient was actually being seen for a nurse visit when unfortunately she had the area on her left lateral lower  extremity as well as weeping from the right lower extremity that became apparent. Therefore we did end up actually seeing her for a full visit with myself. She is having some pain at this site as well but fortunately nothing too significant at this point. No fevers, chills, nausea, or vomiting noted at this time. 3/18-Patient is back to the clinic with the left leg venous leg ulcer, the ulcer is larger in size, has a surface that is densely adherent with fibrinous tissue, the Hydrofera Blue was used but is densely adherent and there was difficulty in removing it. The right lower extremity was also wrapped for weeping edema. Patient has a new area over the left lateral foot above the malleolus that is small and appears to have no debris with intact surrounding skin. Patient is on increased dose of Lasix also as a means to edema management 3/25; the patient has a nonhealing venous ulcer on the medial left leg and last week developed a smaller area on the lateral left calf. We have been using Hydrofera Blue with a contact layer. 4/1; no major change in these wounds areas. Left medial and more recently left lateral calf. I tried Iodoflex last week to aid in debridement she did not tolerate this. She stated her pain was terrible all week. She took the top layer of the 4 layer compression off. 4/8; the patient actually looks somewhat better in terms of her more prominent left lateral calf wound. There is some healthy looking tissue here.  She is still complaining of a lot of discomfort. 4/15; patient in a lot of pain secondary to sciatica. She is on a prednisone taper prescribed by her primary physician. She has the 2 areas one on the left medial and more recently a smaller area on the left lateral calf. Both of these just above the malleoli 4/22; her back pain is better but she still states she is very uncomfortable and now feels she is intolerant to the [employer]. No real change in the wounds we have  been using Sorbact. She has been previously intolerant to Iodoflex. There is not a lot of option about what we can use to debride this wound under compression that she no doubt needs. sHe states Ultram no longer works for her pain 4/29; no major change in the wounds slightly increased depth. Surface on the original medial wound perhaps somewhat improved however the more recent area on the lateral left ankle is 100% covered in very adherent debris we have been using Sorbact. She tolerates 4 layer compression well and her edema control is a lot better. She has not had to come in for a nurse check 5/6; no major change in the condition of the wounds. She did consent to debridement today which was done with some difficulty. Continuing Sorbact. She did not tolerate Iodoflex. She was in for a check of her compression the day after we wrapped her last week this was adjusted but nothing much was found 5/13; no major change in the condition or area of the wounds. I was able to get a fairly aggressive debridement done on the lateral left leg wound. Even using Sorbact under compression. She came back in on Friday to have the wrap changed. She says she felt uncomfortable on the lateral aspect of her ankle. She has a long history of chronic venous insufficiency including previous ablation surgery on this side. 5/20-Patient returns for wounds on left leg with both wounds covered in slough, with the lateral leg wound larger in size, she has been in 3 layer compression and felt more comfortable, she describes pain in ankle, in leg and pins and needles in foot, and is about to try Pamelor for this 6/3; wounds on the left lateral and left medial leg. The area medially which is the most recent of the 2 seems to have had the largest increase in dimensions. We have been using Sorbac to try and debride the surface. She has been to see orthopedics [name], RUSCITTI ([id number]) they apparently did a plain x-ray that was  indeterminant. Diagnosed her with neuropathy and they have ordered an MRI to determine if there is underlying osteomyelitis. This was not high on my thought list but I suppose it is prudent. We have advised her to make an appointment with vein and vascular in [place]. She has a history of a left greater saphenous and accessory vein ablations I wonder if there is anything else that can be done from a surgical point of view to help in these difficult refractory wounds. We have previously healed this wound on one occasion but it keeps on reopening [medial side] Objective Constitutional Patient is hypertensive.. Pulse regular and within target range for patient.Marland Kitchen Respirations regular, non-labored and within target range.. Temperature is normal and within the target range for the patient.Marland Kitchen appears in no distress. Vitals Time Taken: 12:41 PM, Height: 63 in, Weight: 224.7 lbs, BMI: 39.8, Temperature: 98.3 F, Pulse: 87 bpm, Respiratory Rate: 16 breaths/min, Blood Pressure: 145/83 mmHg. General  Notes: Wound exam; left lateral and medial distal lower legs. Copious amounts of adherent debris removed with a #5 curette this is not easy and very difficult as the patient tolerates that poorly. There is no overt cellulitis around the wounds nevertheless the area laterally appears to be increasing in size. I used a #3 curette to obtain a tissue culture. Looking at her feet she has palpable pulses however a lot of dilated superficial veins indicative of venous hypertension Integumentary (Hair, Skin) Wound #5 status is Open. Original cause of wound was Gradually Appeared. The wound is located on the Left,Medial Lower Leg. The wound measures 3.2cm length x 2.5cm width x 0.5cm depth; 6.283cm^2 area and 3.142cm^3 volume. Wound #6 status is Open. Original cause of wound was Gradually Appeared. The wound is located on the Left,Lateral Lower Leg. The wound measures 2.5cm length x 3.5cm width x 0.5cm depth; 6.872cm^2  area and 3.436cm^3 volume. Assessment Active Problems ICD-10 Non-pressure chronic ulcer of left calf limited to breakdown of skin Chronic venous hypertension (idiopathic) with inflammation of right lower extremity Lymphedema, not elsewhere classified Procedures Wound #5 Pre-procedure diagnosis of Wound #5 is a Lymphedema located on the Left,Medial Lower Leg . There was a Excisional Skin/Subcutaneous Tissue Debridement with a total area of 8 sq cm performed by [name] Dillon, MD. With the [name] ([id number]) following instrument(s): Curette to remove Viable and Non-Viable tissue/material. Material removed includes Subcutaneous Tissue and Slough and after achieving pain control using Lidocaine. No specimens were taken. A time out was conducted at 13:02, prior to the start of the procedure. A Moderate amount of bleeding was controlled with Pressure. The procedure was tolerated well. Post Debridement Measurements: 3.2cm length x 2.5cm width x 0.5cm depth; 3.142cm^3 volume. Character of Wound/Ulcer Post Debridement is stable. Post procedure Diagnosis Wound #5: Same as Pre-Procedure Wound #6 Pre-procedure diagnosis of Wound #6 is a Venous Leg Ulcer located on the Left,Lateral Lower Leg .Severity of Tissue Pre Debridement is: Fat layer exposed. There was a Excisional Skin/Subcutaneous Tissue Debridement with a total area of 8.75 sq cm performed by [name] Dillon, MD. With the following instrument(s): Curette to remove Viable and Non-Viable tissue/material. Material removed includes Subcutaneous Tissue and Slough and after achieving pain control using Lidocaine. 1 specimen was taken by a Tissue Culture and sent to the lab per facility protocol. A time out was conducted at 13:02, prior to the start of the procedure. A Moderate amount of bleeding was controlled with Pressure. The procedure was tolerated well. Post Debridement Measurements: 2.5cm length x 3.5cm width x 0.6cm depth;  4.123cm^3 volume. Character of Wound/Ulcer Post Debridement is stable. Severity of Tissue Post Debridement is: Fat layer exposed. Post procedure Diagnosis Wound #6: Same as Pre-Procedure Plan Wound Cleansing: Wound #5 Left,Medial Lower Leg: Clean wound with Normal Saline. May Shower, gently pat wound dry prior to applying new dressing. Wound #6 Left,Lateral Lower Leg: Clean wound with Normal Saline. May Shower, gently pat wound dry prior to applying new dressing. Anesthetic (add to Medication List): Wound #5 Left,Medial Lower Leg: Topical Lidocaine 4% cream applied to wound bed prior to debridement (In Clinic Only). Wound #6 Left,Lateral Lower Leg: Topical Lidocaine 4% cream applied to wound bed prior to debridement (In Clinic Only). Skin Barriers/Peri-Wound Care: Wound #5 Left,Medial Lower Leg: Other: - Antibiotic ointment on irritated areas Primary Wound Dressing: Wound #5 Left,Medial Lower Leg: Other: - Sorbact Wound #6 Left,Lateral Lower Leg: Other: - Sorbact Secondary Dressing: Wound #5 Left,Medial Lower Leg: ABD  pad Wound #6 Left,Lateral Lower Leg: ABD pad Dressing Change Frequency: Wound #5 Left,Medial Lower Leg: Change dressing every week Wound #6 Left,Lateral Lower Leg: Change dressing every week Follow-up Appointments: Wound #5 Left,Medial Lower Leg: Return Appointment in 1 week. KIAH, VANALSTINE ([id number]) Nurse Visit as needed Wound #6 Left,Lateral Lower Leg: Return Appointment in 1 week. Nurse Visit as needed Edema Control: Wound #5 Left,Medial Lower Leg: 4 Layer Compression System - Bilateral Wound #6 Left,Lateral Lower Leg: 4 Layer Compression System - Bilateral Off-Loading: Wound #5 Left,Medial Lower Leg: Other: - Elevate legs as needed Wound #6 Left,Lateral Lower Leg: Other: - Elevate legs as needed Laboratory ordered were: Wound culture routine - Left lateral leg 1. I am continuing the Sorbact this I continue to think the debridement of these  wounds surfaces gets better and at least after debridement the surfaces are certainly better than what I was able to obtain a few weeks ago. 2. I have asked her to make an appointment with vein and vascular in follow-up for her known venous hypertension to see if anything else can be done from a surgical point of view 3I have done a deep tissue culture but on the lateral aspect for CandS but no empiric antibiotics Electronic Signature(s) Signed: 04/08/2019 1:25:41 PM By: Linton Ham MD Entered By: Linton Ham on 04/08/2019 13:25:40 Stall, [name] ([id number]) -------------------------------------------------------------------------------- SuperBill Details Patient Name: NAJMA, BOZARTH. Date of Service: 04/08/2019 Medical Record Number: [id number] Patient Account Number: [id number] Date of Birth/Sex: [birth year] (73 y.o. F) Treating RN: Cornell Barman Primary Care Provider: [name] Other Clinician: Referring Provider: [name] Treating Provider/Extender: Tito Dine in Treatment: 17 Diagnosis Coding ICD-10 Codes Code Description [phone] Non-pressure chronic ulcer of left calf limited to breakdown of skin I87.321 Chronic venous hypertension (idiopathic) with inflammation of right lower extremity I89.0 Lymphedema, not elsewhere classified Facility Procedures CPT4 Code: [id number] Description: 42706 - DEB SUBQ TISSUE 20 SQ CM/< ICD-10 Diagnosis Description L97.221 Non-pressure chronic ulcer of left calf limited to breakdown Modifier: of skin Quantity: 1 Physician Procedures CPT4 Code: [id number] Description: 15176 - WC PHYS SUBQ TISS 20 SQ CM ICD-10 Diagnosis Description L97.221 Non-pressure chronic ulcer of left calf limited to breakdown Modifier: of skin Quantity: 1 Electronic Signature(s) Signed: 04/08/2019 4:12:25 PM By: Linton Ham MD Entered By: Linton Ham on 04/08/2019 13:49:42

## 2019-04-08 NOTE — Progress Notes (Addendum)
BUNNIE, REHBERG ([id number]) Visit Report for 04/08/2019 Arrival Information Details Patient Name: [name], [name]. Date of Service: 04/08/2019 12:45 PM Medical Record Number: [id number] Patient Account Number: [id number] Date of Birth/Sex: July 24, 1946 (73 y.o. F) Treating RN: Cornell Barman Primary Care Briceyda Abdullah: [name] Other Clinician: Referring Amonte Brookover: [name] Treating Darwin Rothlisberger/Extender: Tito Dine in Treatment: 35 Visit Information History Since Last Visit Added or deleted any medications: No Patient Arrived: Ambulatory Any new allergies or adverse reactions: No Arrival Time: 12:40 Had a fall or experienced change in No Accompanied By: self activities of daily living that may affect Transfer Assistance: None risk of falls: Patient Identification Verified: Yes Signs or symptoms of abuse/neglect since last visito No Secondary Verification Process Completed: Yes Hospitalized since last visit: No Implantable device outside of the clinic excluding No cellular tissue based products placed in the center since last visit: Has Dressing in Place as Prescribed: Yes Pain Present Now: No Electronic Signature(s) Signed: 04/08/2019 4:50:33 PM By: [name], [name], RN, CWS, [name], [name] Entered By: [name], [name], RN, CWS, Kim on 04/08/2019 12:41:13 [name] ([id number]) -------------------------------------------------------------------------------- Encounter Discharge Information Details Patient Name: [name], [name]. Date of Service: 04/08/2019 12:45 PM Medical Record Number: [id number] Patient Account Number: [id number] Date of Birth/Sex: November 16, 1945 (73 y.o. F) Treating RN: Cornell Barman Primary Care Martika Egler: [name] Other Clinician: Referring Domonique Brouillard: [name] Treating Trey Bebee/Extender: Tito Dine in Treatment: 17 Encounter Discharge Information Items Post Procedure Vitals Discharge Condition: Stable Temperature (F):  98.5 Ambulatory Status: Ambulatory Pulse (bpm): 78 Discharge Destination: Home Respiratory Rate (breaths/min): 16 Transportation: Private Auto Blood Pressure (mmHg): 145/83 Accompanied By: self Schedule Follow-up Appointment: Yes Clinical Summary of Care: Electronic Signature(s) Signed: 04/08/2019 4:50:33 PM By: [name], [name], RN, CWS, [name], [name] Entered By: [name], [name], RN, CWS, Kim on 04/08/2019 13:12:10 [name] ([id number]) -------------------------------------------------------------------------------- Lower Extremity Assessment Details Patient Name: [name], [name]. Date of Service: 04/08/2019 12:45 PM Medical Record Number: [id number] Patient Account Number: [id number] Date of Birth/Sex: [birth year] (73 y.o. F) Treating RN: Cornell Barman Primary Care Jodie Cavey: [name] Other Clinician: Referring Zadin Lange: [name] Treating Maddex Garlitz/Extender: Tito Dine in Treatment: 17 Edema Assessment Assessed: [Left: No] [Right: No] [Left: Edema] [Right: :] Calf Left: Right: Point of Measurement: 31 cm From Medial Instep 39 cm cm Ankle Left: Right: Point of Measurement: 13 cm From Medial Instep 22.6 cm cm Vascular Assessment Pulses: Dorsalis Pedis Palpable: [Left:Yes] Posterior Tibial Palpable: [Left:Yes] Electronic Signature(s) Signed: 04/08/2019 4:50:33 PM By: [name], [name], RN, CWS, [name], [name] Entered By: [name], [name], RN, CWS, Kim on 04/08/2019 12:52:32 [name] ([id number]) -------------------------------------------------------------------------------- Multi Wound Chart Details Patient Name: [name], [name]. Date of Service: 04/08/2019 12:45 PM Medical Record Number: [id number] Patient Account Number: [id number] Date of Birth/Sex: [birth year] (73 y.o. F) Treating RN: Cornell Barman Primary Care Noami Bove: [name] Other Clinician: Referring Gavina Dildine: [name] Treating Odalis Jordan/Extender: Tito Dine in Treatment: 17 Vital  Signs Height(in): 63 Pulse(bpm): 87 Weight(lbs): 224.7 Blood Pressure(mmHg): 145/83 Body Mass Index(BMI): 40 Temperature(F): 98.3 Respiratory Rate 16 (breaths/min): Photos: [5:No Photos] [6:No Photos] [N/A:N/A] Wound Location: [5:Left, Medial Lower Leg] [6:Left, Lateral Lower Leg] [N/A:N/A] Wounding Event: [5:Gradually Appeared] [6:Gradually Appeared] [N/A:N/A] Primary Etiology: [5:Lymphedema] [6:Venous Leg Ulcer] [N/A:N/A] Date Acquired: [5:11/19/2018] [6:01/19/2019] [N/A:N/A] Weeks of Treatment: [5:17] [6:11] [N/A:N/A] Wound Status: [5:Open] [6:Open] [N/A:N/A] Measurements L x W x D [5:3.2x2.5x0.5] [6:2.5x3.5x0.5] [N/A:N/A] (cm) Area (cm) : [5:6.283] [6:6.872] [N/A:N/A] Volume (cm) : [5:3.142] [6:3.436] [N/A:N/A] % Reduction  in Area: [5:26.50%] [6:-3023.60%] [N/A:N/A] % Reduction in Volume: [5:-267.50%] [6:-15518.20%] [N/A:N/A] Classification: [5:Full Thickness Without Exposed Support Structures] [6:Full Thickness Without Exposed Support Structures] [N/A:N/A] Debridement: [5:Debridement - Excisional] [6:Debridement - Excisional] [N/A:N/A] Pre-procedure [5:13:02] [6:13:02] [N/A:N/A] Verification/Time Out Taken: Pain Control: [5:Lidocaine] [6:Lidocaine] [N/A:N/A] Tissue Debrided: [5:Subcutaneous, Slough] [6:Subcutaneous, Slough] [N/A:N/A] Level: [5:Skin/Subcutaneous Tissue] [6:Skin/Subcutaneous Tissue] [N/A:N/A] Debridement Area (sq cm): [5:8] [6:8.75] [N/A:N/A] Instrument: [5:Curette] [6:Curette] [N/A:N/A] Bleeding: [5:Moderate] [6:Moderate] [N/A:N/A] Hemostasis Achieved: [5:Pressure] [6:Pressure] [N/A:N/A] Debridement Treatment [5:Procedure was tolerated well] [6:Procedure was tolerated well] [N/A:N/A] Response: Post Debridement [5:3.2x2.5x0.5] [6:2.5x3.5x0.6] [N/A:N/A] Measurements L x W x D (cm) Post Debridement Volume: [5:3.142] [2:6.378] [N/A:N/A] (cm) Procedures Performed: [5:Debridement] [6:Debridement] [N/A:N/A] Treatment Notes Wound #5 (Left, Medial Lower  Leg) Quinto, Aundraya J. ([id number]) Notes sorbact, Drawatex, ABD, Unna to anchor, 4-Layer Left Wound #6 (Left, Lateral Lower Leg) Notes sorbact, Drawatex, ABD, Unna to anchor, 4-Layer Left Electronic Signature(s) Signed: 04/08/2019 4:12:25 PM By: Linton Ham MD Entered By: Linton Ham on 04/08/2019 13:17:40 Peedin, [name] ([id number]) -------------------------------------------------------------------------------- Multi-Disciplinary Care Plan Details Patient Name: [name], [name]. Date of Service: 04/08/2019 12:45 PM Medical Record Number: [id number] Patient Account Number: [id number] Date of Birth/Sex: 1946/06/03 (73 y.o. F) Treating RN: Cornell Barman Primary Care Kota Ciancio: [name] Other Clinician: Referring Iolanda Folson: [name] Treating Nhu Glasby/Extender: Tito Dine in Treatment: 17 Active Inactive Orientation to the Wound Care Program Nursing Diagnoses: Knowledge deficit related to the wound healing center program Goals: Patient/caregiver will verbalize understanding of the [facility] Program Date Initiated: 12/10/2018 Target Resolution Date: 01/09/2019 Goal Status: Active Interventions: Provide education on orientation to the wound center Notes: Soft Tissue Infection Nursing Diagnoses: Impaired tissue integrity Goals: Patient's soft tissue infection will resolve Date Initiated: 12/10/2018 Target Resolution Date: 01/09/2019 Goal Status: Active Interventions: Assess signs and symptoms of infection every visit Notes: Venous Leg Ulcer Nursing Diagnoses: Actual venous Insuffiency (use after diagnosis is confirmed) Goals: Patient will maintain optimal edema control Date Initiated: 12/10/2018 Target Resolution Date: 01/09/2019 Goal Status: Active Interventions: Assess peripheral edema status every visit. [name], [name] ([id number]) Treatment Activities: Therapeutic compression applied : 12/10/2018 Notes: Wound/Skin Impairment Nursing  Diagnoses: Impaired tissue integrity Goals: Patient/caregiver will verbalize understanding of skin care regimen Date Initiated: 12/10/2018 Target Resolution Date: 01/09/2019 Goal Status: Active Interventions: Assess ulceration(s) every visit Treatment Activities: Topical wound management initiated : 12/10/2018 Notes: Electronic Signature(s) Signed: 04/20/2019 4:30:53 PM By: [name], [name], RN, CWS, [name], [name] Previous Signature: 04/08/2019 4:50:33 PM Version By: [name], [name], RN, CWS, [name], [name] Entered By: [name], [name], RN, CWS, Kim on 04/10/2019 14:11:31 [name], [name] ([id number]) -------------------------------------------------------------------------------- Pain Assessment Details Patient Name: [name], [name]. Date of Service: 04/08/2019 12:45 PM Medical Record Number: [id number] Patient Account Number: [id number] Date of Birth/Sex: 1946-05-15 (73 y.o. F) Treating RN: Cornell Barman Primary Care Cleola Perryman: [name] Other Clinician: Referring Sriya Kroeze: [name] Treating Jaicob Dia/Extender: Tito Dine in Treatment: 17 Active Problems Location of Pain Severity and Description of Pain Patient Has Paino No Site Locations Pain Management and Medication Current Pain Management: Electronic Signature(s) Signed: 04/08/2019 4:50:33 PM By: [name], [name], RN, CWS, [name], [name] Entered By: [name], [name], RN, CWS, Kim on 04/08/2019 12:41:25 [name] ([id number]) -------------------------------------------------------------------------------- Patient/Caregiver Education Details Patient Name: [name], [name]. Date of Service: 04/08/2019 12:45 PM Medical Record Number: [id number] Patient Account Number: [id number] Date of Birth/Gender: April 25, 1946 (73 y.o. F) Treating RN: Cornell Barman Primary Care Physician: [name] Other Clinician: Referring Physician: [name] Treating Physician/Extender: [name] Dillon  Weeks in Treatment: 17 Education Assessment Education  Provided To: Patient Education Topics Provided Wound/Skin Impairment: Handouts: Caring for Your Ulcer Methods: Demonstration, Explain/Verbal Responses: State content correctly Electronic Signature(s) Signed: 04/08/2019 4:50:33 PM By: [name], [name], RN, CWS, [name], [name] Entered By: [name], [name], RN, CWS, Kim on 04/08/2019 13:10:14 [name] ([id number]) -------------------------------------------------------------------------------- Wound Assessment Details Patient Name: [name], [name]. Date of Service: 04/08/2019 12:45 PM Medical Record Number: [id number] Patient Account Number: [id number] Date of Birth/Sex: 06-30-1946 (73 y.o. F) Treating RN: Cornell Barman Primary Care Waqas Bruhl: [name] Other Clinician: Referring Shuntavia Yerby: [name] Treating Redding Cloe/Extender: Tito Dine in Treatment: 17 Wound Status Wound Number: 5 Primary Etiology: Lymphedema Wound Location: Left, Medial Lower Leg Wound Status: Open Wounding Event: Gradually Appeared Date Acquired: 11/19/2018 Weeks Of Treatment: 17 Clustered Wound: No Photos Photo Uploaded By: [name], [name], RN, CWS, Kim on 04/08/2019 13:37:13 Wound Measurements Length: (cm) 3.2 Width: (cm) 2.5 Depth: (cm) 0.5 Area: (cm) 6.283 Volume: (cm) 3.142 % Reduction in Area: 26.5% % Reduction in Volume: -267.5% Wound Description Full Thickness Without Exposed Support Classification: Structures Electronic Signature(s) Signed: 04/08/2019 4:50:33 PM By: [name], [name], RN, CWS, [name], [name] Entered By: [name], [name], RN, CWS, Kim on 04/08/2019 12:50:18 [name] ([id number]) -------------------------------------------------------------------------------- Wound Assessment Details Patient Name: [name], [name]. Date of Service: 04/08/2019 12:45 PM Medical Record Number: [id number] Patient Account Number: [id number] Date of Birth/Sex: Sep 23, 1946 (73 y.o. F) Treating RN: Cornell Barman Primary Care Lilyana Lippman: [name]  Other Clinician: Referring Chasmine Lender: [name] Treating Jadd Gasior/Extender: Tito Dine in Treatment: 17 Wound Status Wound Number: 6 Primary Etiology: Venous Leg Ulcer Wound Location: Left, Lateral Lower Leg Wound Status: Open Wounding Event: Gradually Appeared Date Acquired: 01/19/2019 Weeks Of Treatment: 11 Clustered Wound: No Photos Photo Uploaded By: [name], [name], RN, CWS, Kim on 04/08/2019 13:37:14 Wound Measurements Length: (cm) 2.5 Width: (cm) 3.5 Depth: (cm) 0.5 Area: (cm) 6.872 Volume: (cm) 3.436 % Reduction in Area: -3023.6% % Reduction in Volume: -15518.2% Wound Description Full Thickness Without Exposed Support Classification: Structures Electronic Signature(s) Signed: 04/08/2019 4:50:33 PM By: [name], [name], RN, CWS, [name], [name] Entered By: [name], [name], RN, CWS, Kim on 04/08/2019 12:50:18 [name] ([id number]) -------------------------------------------------------------------------------- Vitals Details Patient Name: [name], [name]. Date of Service: 04/08/2019 12:45 PM Medical Record Number: [id number] Patient Account Number: [id number] Date of Birth/Sex: Nov 13, 1945 (73 y.o. F) Treating RN: Cornell Barman Primary Care Felicie Kocher: [name] Other Clinician: Referring Abriella Filkins: [name] Treating Melyssa Signor/Extender: Tito Dine in Treatment: 17 Vital Signs Time Taken: 12:41 Temperature (F): 98.3 Height (in): 63 Pulse (bpm): 87 Weight (lbs): 224.7 Respiratory Rate (breaths/min): 16 Body Mass Index (BMI): 39.8 Blood Pressure (mmHg): 145/83 Reference Range: 80 - 120 mg / dl Electronic Signature(s) Signed: 04/08/2019 4:50:33 PM By: [name], [name], RN, CWS, [name], [name] Entered By: [name], [name], RN, CWS, Kim on 04/08/2019 12:43:35

## 2019-04-09 ENCOUNTER — Other Ambulatory Visit
Admission: RE | Admit: 2019-04-09 | Discharge: 2019-04-09 | Disposition: A | Discharge status: Home / Self Care | Payer: Medicare Other | Source: Ambulatory Visit | Attending: Internal Medicine | Admitting: Internal Medicine

## 2019-04-09 DIAGNOSIS — L03116 Cellulitis of left lower limb: Secondary | ICD-10-CM | POA: Diagnosis not present

## 2019-04-10 ENCOUNTER — Other Ambulatory Visit: Payer: Self-pay

## 2019-04-10 ENCOUNTER — Encounter: Payer: Medicare Other | Attending: Physician Assistant

## 2019-04-10 DIAGNOSIS — I890 Lymphedema, not elsewhere classified: Secondary | ICD-10-CM | POA: Insufficient documentation

## 2019-04-10 DIAGNOSIS — G5603 Carpal tunnel syndrome, bilateral upper limbs: Secondary | ICD-10-CM | POA: Diagnosis not present

## 2019-04-10 DIAGNOSIS — L97829 Non-pressure chronic ulcer of other part of left lower leg with unspecified severity: Secondary | ICD-10-CM | POA: Insufficient documentation

## 2019-04-10 NOTE — Progress Notes (Signed)
SOMER, [name] ([id number]) Visit Report for 04/10/2019 Arrival Information Details Patient Name: DAYTON, SHERR. Date of Service: 04/10/2019 2:00 PM Medical Record Number: [id number] Patient Account Number: [id number] Date of Birth/Sex: 1946-11-02 (73 y.o. F) Treating RN: Cornell Barman Primary Care Takai Chiaramonte: [name] Other Clinician: Referring Sameul Tagle: [name] Treating Aliece Honold/Extender: Melburn Hake, HOYT Weeks in Treatment: 70 Visit Information History Since Last Visit Added or deleted any medications: No Patient Arrived: Ambulatory Any new allergies or adverse reactions: No Arrival Time: 13:48 Had a fall or experienced change in No Accompanied By: self activities of daily living that may affect Transfer Assistance: None risk of falls: Patient Identification Verified: Yes Signs or symptoms of abuse/neglect since last visito No Secondary Verification Process Completed: Yes Hospitalized since last visit: No Implantable device outside of the clinic excluding No cellular tissue based products placed in the center since last visit: Has Dressing in Place as Prescribed: Yes Has Compression in Place as Prescribed: Yes Pain Present Now: No Notes Patient has discomfort not pain Electronic Signature(s) Signed: 04/10/2019 4:49:57 PM By: [name], [name], RN, CWS, [name], [name] Entered By: [name], [name], RN, CWS, Kim on 04/10/2019 13:49:08 [name] ([id number]) -------------------------------------------------------------------------------- Compression Therapy Details Patient Name: FADUMA, CHO. Date of Service: 04/10/2019 2:00 PM Medical Record Number: [id number] Patient Account Number: [id number] Date of Birth/Sex: Mar 01, 1946 (73 y.o. F) Treating RN: Cornell Barman Primary Care Maigan Bittinger: [name] Other Clinician: Referring Oddie Kuhlmann: [name] Treating Arcelia Pals/Extender: Melburn Hake, HOYT Weeks in Treatment: 17 Compression Therapy Performed for Wound Assessment:  Wound #5 Left,Medial Lower Leg Performed By: Clinician Cornell Barman, RN Compression Type: Three Layer Pre Treatment ABI: 1 Electronic Signature(s) Signed: 04/10/2019 4:49:57 PM By: [name], [name], RN, CWS, [name], [name] Entered By: [name], [name], RN, CWS, Kim on 04/10/2019 14:12:31 [name] ([id number]) -------------------------------------------------------------------------------- Compression Therapy Details Patient Name: AYLISSA, HEINEMANN. Date of Service: 04/10/2019 2:00 PM Medical Record Number: [id number] Patient Account Number: [id number] Date of Birth/Sex: 08/21/1946 (73 y.o. F) Treating RN: Cornell Barman Primary Care Michaela Shankel: [name] Other Clinician: Referring Mckynlee Luse: [name] Treating Mercedes Valeriano/Extender: Melburn Hake, HOYT Weeks in Treatment: 17 Compression Therapy Performed for Wound Assessment: Wound #6 Left,Lateral Lower Leg Performed By: Clinician Cornell Barman, RN Compression Type: Three Layer Pre Treatment ABI: 1 Electronic Signature(s) Signed: 04/10/2019 4:49:57 PM By: [name], [name], RN, CWS, [name], [name] Entered By: [name], [name], RN, CWS, Kim on 04/10/2019 14:12:32 PAXTON, KANAAN ([id number]) -------------------------------------------------------------------------------- Wound Assessment Details Patient Name: STEPHAINE, BRESHEARS. Date of Service: 04/10/2019 2:00 PM Medical Record Number: [id number] Patient Account Number: [id number] Date of Birth/Sex: [birth year] (73 y.o. F) Treating RN: Cornell Barman Primary Care Mason Burleigh: [name] Other Clinician: Referring Garielle Mroz: [name] Treating Loretto Belinsky/Extender: Melburn Hake, HOYT Weeks in Treatment: 17 Wound Status Wound Number: 5 Primary Etiology: Lymphedema Wound Location: Left, Medial Lower Leg Wound Status: Open Wounding Event: Gradually Appeared Date Acquired: 11/19/2018 Weeks Of Treatment: 17 Clustered Wound: No Wound Measurements Length: (cm) 3.2 Width: (cm) 2.3 Depth: (cm) 0.5 Area: (cm)  5.781 Volume: (cm) 2.89 % Reduction in Area: 32.3% % Reduction in Volume: -238% Wound Description Full Thickness Without Exposed Support Classification: Structures Treatment Notes Wound #5 (Left, Medial Lower Leg) Notes sorbact, drawtex, 3-layer left Electronic Signature(s) Signed: 04/10/2019 4:49:57 PM By: [name], [name], RN, CWS, [name], [name] Entered By: [name], [name], RN, CWS, Kim on 04/10/2019 14:12:08 [name] ([id number]) -------------------------------------------------------------------------------- Wound Assessment Details Patient Name: LATAYSHA, VOHRA. Date of Service: 04/10/2019 2:00 PM Medical Record Number:  [id number] Patient Account Number: [id number] Date of Birth/Sex: 07/26/1946 (73 y.o. F) Treating RN: Cornell Barman Primary Care Calaya Gildner: [name] Other Clinician: Referring Miette Molenda: [name] Treating Jeannia Tatro/Extender: Melburn Hake, HOYT Weeks in Treatment: 17 Wound Status Wound Number: 6 Primary Etiology: Venous Leg Ulcer Wound Location: Left, Lateral Lower Leg Wound Status: Open Wounding Event: Gradually Appeared Date Acquired: 01/19/2019 Weeks Of Treatment: 11 Clustered Wound: No Wound Measurements Length: (cm) 2.5 Width: (cm) 3.7 Depth: (cm) 0.5 Area: (cm) 7.265 Volume: (cm) 3.632 % Reduction in Area: -3202.3% % Reduction in Volume: -16409.1% Wound Description Full Thickness Without Exposed Support Classification: Structures Treatment Notes Wound #6 (Left, Lateral Lower Leg) Notes sorbact, drawtex, 3-layer left Electronic Signature(s) Signed: 04/10/2019 4:49:57 PM By: [name], [name], RN, CWS, [name], [name] Entered By: [name], [name], RN, CWS, Kim on 04/10/2019 14:12:08

## 2019-04-12 LAB — AEROBIC CULTURE  (SUPERFICIAL SPECIMEN)

## 2019-04-12 LAB — AEROBIC CULTURE W GRAM STAIN (SUPERFICIAL SPECIMEN): Gram Stain: NONE SEEN

## 2019-04-13 ENCOUNTER — Ambulatory Visit: Payer: Medicare Other | Admitting: Family Medicine

## 2019-04-15 ENCOUNTER — Encounter: Payer: Medicare Other | Admitting: Internal Medicine

## 2019-04-15 ENCOUNTER — Other Ambulatory Visit: Payer: Self-pay

## 2019-04-15 DIAGNOSIS — I87332 Chronic venous hypertension (idiopathic) with ulcer and inflammation of left lower extremity: Secondary | ICD-10-CM | POA: Diagnosis not present

## 2019-04-15 DIAGNOSIS — L97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d: Secondary | ICD-10-CM | POA: Diagnosis not present

## 2019-04-15 DIAGNOSIS — I87312 Chronic venous hypertension (idiopathic) with ulcer of left lower extremity: Secondary | ICD-10-CM | POA: Diagnosis not present

## 2019-04-15 DIAGNOSIS — L97221 Non-pressure chronic ulcer of left calf limited to breakdown of skin: Secondary | ICD-10-CM | POA: Diagnosis not present

## 2019-04-15 DIAGNOSIS — I890 Lymphedema, not elsewhere classified: Secondary | ICD-10-CM | POA: Diagnosis not present

## 2019-04-15 DIAGNOSIS — S81802A Unspecified open wound, left lower leg, initial encounter: Secondary | ICD-10-CM | POA: Diagnosis not present

## 2019-04-15 NOTE — Progress Notes (Signed)
[name], [name] ([id number]) Visit Report for 04/15/2019 Debridement Details Patient Name: [name], [name]. Date of Service: 04/15/2019 12:45 PM Medical Record Number: [id number] Patient Account Number: [id number] Date of Birth/Sex: 1946-01-03 (73 y.o. F) Treating RN: Harold Barban Primary Care Provider: [name] Other Clinician: Referring Provider: [name] Treating Provider/Extender: Tito Dine in Treatment: 18 Debridement Performed for Wound #5 Left,Medial Lower Leg Assessment: Performed By: Physician [name] Dillon, MD Debridement Type: Debridement Level of Consciousness (Pre- Awake and Alert procedure): Pre-procedure Verification/Time Yes - 13:06 Out Taken: Start Time: 13:06 Pain Control: Lidocaine Total Area Debrided (L x W): 2.7 (cm) x 2.6 (cm) = 7.02 (cm) Tissue and other material Viable, Non-Viable, Slough, Subcutaneous, Slough debrided: Level: Skin/Subcutaneous Tissue Debridement Description: Excisional Instrument: Curette Bleeding: Minimum Hemostasis Achieved: Pressure End Time: 13:08 Procedural Pain: 6 Post Procedural Pain: 6 Response to Treatment: Procedure was tolerated well Level of Consciousness Awake and Alert (Post-procedure): Post Debridement Measurements of Total Wound Length: (cm) 2.7 Width: (cm) 2.6 Depth: (cm) 0.5 Volume: (cm) 2.757 Character of Wound/Ulcer Post Debridement: Improved Post Procedure Diagnosis Same as Pre-procedure Electronic Signature(s) Signed: 04/15/2019 4:01:30 PM By: Linton Ham MD Signed: 04/15/2019 4:18:59 PM By: Harold Barban Entered By: Linton Ham on 04/15/2019 13:11:56 Didonato, [name] ([id number]) -------------------------------------------------------------------------------- Debridement Details Patient Name: [name], [name]. Date of Service: 04/15/2019 12:45 PM Medical Record Number: [id number] Patient Account Number: [id number] Date of Birth/Sex: 07-10-1946 (73 y.o.  F) Treating RN: Harold Barban Primary Care Provider: [name] Other Clinician: Referring Provider: [name] Treating Provider/Extender: Tito Dine in Treatment: 18 Debridement Performed for Wound #6 Left,Lateral Lower Leg Assessment: Performed By: Physician [name] Dillon, MD Debridement Type: Debridement Severity of Tissue Pre Fat layer exposed Debridement: Level of Consciousness (Pre- Awake and Alert procedure): Pre-procedure Verification/Time Yes - 13:06 Out Taken: Start Time: 13:06 Pain Control: Lidocaine Total Area Debrided (L x W): 2.8 (cm) x 4.9 (cm) = 13.72 (cm) Tissue and other material Viable, Non-Viable, Slough, Subcutaneous, Slough debrided: Level: Skin/Subcutaneous Tissue Debridement Description: Excisional Instrument: Curette Bleeding: Minimum Hemostasis Achieved: Pressure End Time: 13:08 Procedural Pain: 6 Post Procedural Pain: 6 Response to Treatment: Procedure was tolerated well Level of Consciousness Awake and Alert (Post-procedure): Post Debridement Measurements of Total Wound Length: (cm) 2.8 Width: (cm) 4.9 Depth: (cm) 0.5 Volume: (cm) 5.388 Character of Wound/Ulcer Post Debridement: Improved Severity of Tissue Post Debridement: Fat layer exposed Post Procedure Diagnosis Same as Pre-procedure Electronic Signature(s) Signed: 04/15/2019 4:01:30 PM By: Linton Ham MD Signed: 04/15/2019 4:18:59 PM By: Harold Barban Entered By: Linton Ham on 04/15/2019 13:12:07 [name] ([id number]) -------------------------------------------------------------------------------- HPI Details Patient Name: [name]. Date of Service: 04/15/2019 12:45 PM Medical Record Number: [id number] Patient Account Number: [id number] Date of Birth/Sex: 12/28/1945 (73 y.o. F) Treating RN: Harold Barban Primary Care Provider: [name] Other Clinician: Referring Provider: [name] Treating  Provider/Extender: Tito Dine in Treatment: 18 History of Present Illness HPI Description: Pleasant 73 year old with history of chronic venous insufficiency. No diabetes or peripheral vascular disease. Left ABI 1.29. Questionable history of left lower extremity DVT. She developed a recurrent ulceration on her left lateral calf in December 2015, which she attributes to poor diet and subsequent lower extremity edema. She underwent endovenous laser ablation of her left greater saphenous vein in 2010. She underwent laser ablation of accessory branch of left GSV in April 2016 by Dr. Kellie Simmering at [facility]. She was previously wearing Unna boots, which she tolerated well. Tolerating 2  layer compression and cadexomer iodine. She returns to clinic for follow-up and is without new complaints. She denies any significant pain at this time. She reports persistent pain with pressure. No claudication or ischemic rest pain. No fever or chills. No drainage. READMISSION 11/13/16; this is a 73 year old woman who is not a diabetic. She is here for a review of a painful area on her left medial lower extremity. I note that she was seen here previously last year for wound I believe to be in the same area. At that time she had undergone previously a left greater saphenous vein ablation by Dr. Kellie Simmering and she had a ablation of the anterior accessory branch of the left greater saphenous vein in March 2016. Seeing that the wound actually closed over. In reviewing the history with her today the ulcer in this area has been recurrent. She describes a biopsy of this area in 2009 that only showed stasis physiology. 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 Do not see that she has ever had arterial  studies in the left leg. She tells me she has a follow-up with Dr. Kellie Simmering in roughly 10 days In any case she developed the reopening of this area roughly a month ago. On the background of this she describes rapidly increasing edema which has responded to Lasix 40 mg and metolazone 2.5 mg as well as the patient's lymph massage. She has been told she has both venous insufficiency and lymphedema but she cannot tolerate compression stockings 11/28/16; the patient saw Dr. Kellie Simmering recently. Per the patient he did arterial Dopplers in the office that did not show evidence of arterial insufficiency, per the patient he stated "treat this like an ordinary venous ulcer". She also saw her dermatologist Dr. Ronnald Ramp who felt that this was more of a vascular ulcer. In general things are improving although she arrives today with increasing bilateral lower extremity edema with weeping a deeper fluid through the wound on the left medial leg compatible with some degree of lymphedema 12/04/16; the patient's wound is fully epithelialized but I don't think fully healed. We will do another week of depression with Promogran and TCA however I suspect we'll be able to discharge her next week. This is a very unusual-looking wound which was initially a figure-of-eight type wound lying on its side surrounded by petechial like hemorrhage. She has had venous ablation on this side. She apparently does not have an arterial issue per Dr. Kellie Simmering. She saw her dermatologist thought it was "vascular". Patient is definitely going to need ongoing compression and I talked about this with her today she will go to elastic therapy after she leaves here next week 12/11/16; the patient's wound is not completely closed today. She has surrounding scar tissue and in further discussion with the patient it would appear that she had ulcers in this area in 2009 for a prolonged period of time ultimately requiring a punch biopsy of this area that only showed  venous insufficiency. I did not previously pickup on this part of the history from the patient. 12/18/16; the patient's wound is completely epithelialized. There is no open area here. She has significant bilateral venous insufficiency with secondary lymphedema to a mild-to-moderate degree she does not have compression stockings.. She did not say anything  to me when I was in the room, she told our intake nurse that she was still having pain in this area. This isn't unusual recurrent small open area. She is going to go to elastic therapy to obtain compression stockings. 12/25/16; the patient's wound is fully epithelialized. There is no open area here. The patient describes some continued episodic discomfort in this area medial left calf. However everything looks fine and healed here. She is been to elastic therapy and caught herself 15-20 mmHg stockings, they apparently were having trouble getting 20-30 mm stockings in her size LYNLEIGH, [name] ([id number]) 01/22/17; this is a patient we discharged from the clinic a month ago. She has a recurrent open wound on her medial left calf. She had 15 mm support stockings. I told her I thought she needed 20-30 mm compression stockings. She tells me that she has been ill with hospitalization secondary to asthma and is been found to have severe hypokalemia likely secondary to a combination of Lasix and metolazone. This morning she noted blistering and leaking fluid on the posterior part of her left leg. She called our intake nurse urgently and we was saw her this afternoon. She has not had any real discomfort here. I don't know that she's been wearing any stockings on this leg for at least 2-3 days. ABIs in this clinic were 1.21 on the right and 1.3 on the left. She is previously seen vascular surgery who does not think that there is a peripheral arterial issue. 01/30/17; Patient arrives with no open wound on the left leg. She has been to elastic therapy and obtained  20-38mmhg below knee stockings and she has one on the right leg today. READMISSION 02/19/18; this Benedick is a now 73 year old patient we've had in this clinic perhaps 3 times before. I had last looked at her from January 07 December 2016 with an area on the medial left leg. We discharged her on 12/25/16 however she had to be readmitted on 01/22/17 with a recurrence. I have in my notes that we discharged her on 20-30 mm stockings although she tells me she was only wearing support hose because she cannot get stockings on predominantly related to her cervical spine surgery/issues. She has had previous ablations done by vein and vascular in [place] including a great saphenous vein ablation on the left with an anterior accessory branch ablation I think both of these were in 2016. On one of the previous visit she had a biopsy noted 2009 that was negative. She is not felt to have an arterial issue. She is not a diabetic. She does have a history of obstructive sleep apnea hypertension asthma as well as chronic venous insufficiency and lymphedema. On this occasion she noted 2 dry scaly patch on her left leg. She tried to put lotion on this it didn't really help. There were 2 open areas.the patient has been seeing her primary physician from 02/05/18 through 02/14/18. She had Unna boots applied. The superior wound now on the lateral left leg has closed but she's had one wound that remains open on the lateral left leg. This is not the same spot as we dealt with in 2018. ABIs in this clinic were 1.3 bilaterally 02/26/18; patient has a small wound on the left lateral calf. Dimensions are down. She has chronic venous insufficiency and lymphedema. 03/05/18; small open area on the left lateral calf. Dimensions are down. Tightly adherent necrotic debris over the surface of the wound which was difficult to remove. Also  the dressing [over collagen] stuck to the wound surface. This was removed with some difficulty as well.  Change the primary dressing to Hydrofera Blue ready 03/12/18; small open area on the left lateral calf. Comes in with tightly adherent surface eschar as well as some adherent Hydrofera Blue. 03/19/18; open area on the left lateral calf. Again adherent surface eschar as well as some adherent Hydrofera Blue nonviable subcutaneous tissue. She complained of pain all week even with the reduction from 4-3 layer compression I put on last week. Also she had an increase in her ankle and calf measurements probably related to the same thing. 03/26/18; open area on the left lateral calf. A very small open area remains here. We used silver alginate starting last week as the Hydrofera Blue seem to stick to the wound bed. In using 4-layer compression 04/02/18; the open area in the left lateral calf at some adherent slough which I removed there is no open area here. We are able to transition her into her own compression stocking. Truthfully I think this is probably his support hose. However this does not maintain skin integrity will be limited. She cannot put over the toe compression stockings on because of neck problems hand problems etc. She is allergic to the lining layer of juxta lites. We might be forced to use extremitease stocking should this fail READMIT 11/24/2018 Patient is now a 73 year old woman who is not a diabetic. She has been in this clinic on at least 3 previous occasions largely with recurrent wounds on her left leg secondary to chronic venous insufficiency with secondary lymphedema. Her situation is complicated by inability to get stockings on and an allergy to neoprene which is apparently a component and at least juxta lites and other stockings. As a result she really has not been wearing any stockings on her legs. She tells [place] that roughly 2 or 3 weeks ago she started noticing a stinging sensation just above her ankle on the left medial aspect. She has been diagnosed with pseudogout and she  wondered whether this was what she was experiencing. She tried to dress this with something she bought at the store however subsequently it pulled skin off and now she has an open wound that is not improving. She has been using Vaseline gauze with a cover bandage. She saw her primary doctor last week who put an Haematologist on her. ABIs in this clinic was 1.03 on the left DONYETTA, OGLETREE. ([id number]) 2/12; the area is on the left medial ankle. Odd-looking wound with what looks to be surface epithelialization but a multitude of small petechial openings. This clearly not closed yet. We have been using silver alginate under 3 layer compression with TCA 2/19; the wound area did not look quite as good this week. Necrotic debris over the majority of the wound surface which required debridement. She continues to have a multitude of what looked to be small petechial openings. She reminds [place] that she had a biopsy on this initially during her first outbreak in 2015 in [facility]. She expresses concern about this being a possible melanoma. 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 I assured her that this does not look anything like melanoma. Except for the petechial reaction it does look like a venous insufficiency area and she certainly has evidence of this on both sides 2/26; a difficult area on the left medial ankle. The patient clearly  has chronic venous hypertension with some degree of lymphedema. The odd thing about the area is the small petechial hemorrhages. I am not really sure how to explain this. This was present last time and this is not a compression injury. We have been using Hydrofera Blue which I changed to last week 3/4; still using Hydrofera Blue. Aggressive debridement today. She does not have known arterial issues. She has seen Dr. Kellie Simmering at [facility] vein and vascular and and has an  ablation on the left. [Anterior accessory branch of the greater saphenous]. From what I remember they did not feel she had an arterial issue. The patient has had this area biopsied in 2009 at [facility] dermatology and by her recollection they said this was "stasis". She is also follow-up with dermatology locally who thought that this was more of a vascular issue 3/11; using Hydrofera Blue. Aggressive debridement today. She does not have an arterial issue. We are using 3 layer compression although we may need to go to 4. The patient has been in for multiple changes to her wrap since I last saw her a week ago. She says that the area was leaking. I do not have too much more information on what was found 01/19/19 on evaluation today patient was actually being seen for a nurse visit when unfortunately she had the area on her left lateral lower extremity as well as weeping from the right lower extremity that became apparent. Therefore we did end up actually seeing her for a full visit with myself. She is having some pain at this site as well but fortunately nothing too significant at this point. No fevers, chills, nausea, or vomiting noted at this time. 3/18-Patient is back to the clinic with the left leg venous leg ulcer, the ulcer is larger in size, has a surface that is densely adherent with fibrinous tissue, the Hydrofera Blue was used but is densely adherent and there was difficulty in removing it. The right lower extremity was also wrapped for weeping edema. Patient has a new area over the left lateral foot above the malleolus that is small and appears to have no debris with intact surrounding skin. Patient is on increased dose of Lasix also as a means to edema management 3/25; the patient has a nonhealing venous ulcer on the medial left leg and last week developed a smaller area on the lateral left calf. We have been using Hydrofera Blue with a contact layer. 4/1; no major change in these wounds  areas. Left medial and more recently left lateral calf. I tried Iodoflex last week to aid in debridement she did not tolerate this. She stated her pain was terrible all week. She took the top layer of the 4 layer compression off. 4/8; the patient actually looks somewhat better in terms of her more prominent left lateral calf wound. There is some healthy looking tissue here. She is still complaining of a lot of discomfort. 4/15; patient in a lot of pain secondary to sciatica. She is on a prednisone taper prescribed by her primary physician. She has the 2 areas one on the left medial and more recently a smaller area on the left lateral calf. Both of these just above the malleoli 4/22; her back pain is better but she still states she is very uncomfortable and now feels she is intolerant to the [employer]. No real change in the wounds we have been using Sorbact. She has been previously intolerant to Iodoflex. There is not a lot of  option about what we can use to debride this wound under compression that she no doubt needs. sHe states Ultram no longer works for her pain 4/29; no major change in the wounds slightly increased depth. Surface on the original medial wound perhaps somewhat improved however the more recent area on the lateral left ankle is 100% covered in very adherent debris we have been using Sorbact. She tolerates 4 layer compression well and her edema control is a lot better. She has not had to come in for a nurse check 5/6; no major change in the condition of the wounds. She did consent to debridement today which was done with some difficulty. Continuing Sorbact. She did not tolerate Iodoflex. She was in for a check of her compression the day after we wrapped her last week this was adjusted but nothing much was found 5/13; no major change in the condition or area of the wounds. I was able to get a fairly aggressive debridement done on the lateral left leg wound. Even using Sorbact under  compression. She came back in on Friday to have the wrap changed. She says she felt uncomfortable on the lateral aspect of her ankle. She has a long history of chronic venous insufficiency including previous ablation surgery on this side. 5/20-Patient returns for wounds on left leg with both wounds covered in slough, with the lateral leg wound larger in size, she has been in 3 layer compression and felt more comfortable, she describes pain in ankle, in leg and pins and needles in foot, and is about to try Pamelor for this 6/3; wounds on the left lateral and left medial leg. The area medially which is the most recent of the 2 seems to have had the largest increase in dimensions. We have been using Sorbac to try and debride the surface. She has been to see orthopedics they apparently did a plain x-ray that was indeterminant. Diagnosed her with neuropathy and they have ordered an MRI to JERITA, [name]. ([id number]) determine if there is underlying osteomyelitis. This was not high on my thought list but I suppose it is prudent. We have advised her to make an appointment with vein and vascular in [place]. She has a history of a left greater saphenous and accessory vein ablations I wonder if there is anything else that can be done from a surgical point of view to help in these difficult refractory wounds. We have previously healed this wound on one occasion but it keeps on reopening [medial side] 6/10; deep tissue culture I did last week I think on the left medial wound showed both moderate E. coli and moderate staph aureus [MSSA]. She is going to require antibiotics and I have chosen Augmentin. We have been using Sorbact and we have made better looking wound surface on both sides but certainly no improvement in wound area. She was back in last Friday apparently for a dressing changes the wrap was hurting her outer left ankle. She has not managed to get a hold of vein and vascular in [place]. We  are going to have to make her that appointment Electronic Signature(s) Signed: 04/15/2019 4:01:30 PM By: Linton Ham MD Entered By: Linton Ham on 04/15/2019 13:13:31 Mewborn, [name] ([id number]) -------------------------------------------------------------------------------- Physical Exam Details Patient Name: CAI, [name]. Date of Service: 04/15/2019 12:45 PM Medical Record Number: [id number] Patient Account Number: [id number] Date of Birth/Sex: June 05, 1946 (73 y.o. F) Treating RN: Harold Barban Primary Care Provider: [name] Other Clinician: Referring Provider: [name] Treating  Provider/Extender: [name] Dillon Weeks in Treatment: 18 Constitutional Sitting or standing Blood Pressure is within target range for patient.. Pulse regular and within target range for patient.Marland Kitchen Respirations regular, non-labored and within target range.. Temperature is normal and within the target range for the patient.Marland Kitchen appears in no distress. Notes Wound exam; left lateral and medial lower legs. Still a moderate amount of adherent debris which I debrided with an open curette. Again she does not tolerate debridement debridement well. The wound surfaces are still not 100% where I would like them but they are much improved. There is irritation around the wound which I think was the wrap issue she complained about last week. There does not look to be infection nevertheless I am concerned about the area around the left lateral calf wound Electronic Signature(s) Signed: 04/15/2019 4:01:30 PM By: Linton Ham MD Entered By: Linton Ham on 04/15/2019 13:14:45 Mcneeley, [name] ([id number]) -------------------------------------------------------------------------------- Physician Orders Details Patient Name: DOAA, KENDZIERSKI. Date of Service: 04/15/2019 12:45 PM Medical Record Number: [id number] Patient Account Number: [id number] Date of Birth/Sex: November 09, 1945 (73 y.o. F) Treating  RN: Harold Barban Primary Care Provider: [name] Other Clinician: Referring Provider: [name] Treating Provider/Extender: Tito Dine in Treatment: 18 Verbal / Phone Orders: No Diagnosis Coding Wound Cleansing Wound #5 Left,Medial Lower Leg o Clean wound with Normal Saline. o May Shower, gently pat wound dry prior to applying new dressing. Wound #6 Left,Lateral Lower Leg o Clean wound with Normal Saline. o May Shower, gently pat wound dry prior to applying new dressing. Anesthetic (add to Medication List) Wound #5 Left,Medial Lower Leg o Topical Lidocaine 4% cream applied to wound bed prior to debridement (In Clinic Only). Wound #6 Left,Lateral Lower Leg o Topical Lidocaine 4% cream applied to wound bed prior to debridement (In Clinic Only). Skin Barriers/Peri-Wound Care Wound #5 Left,Medial Lower Leg o Other: - Antibiotic ointment on irritated areas Primary Wound Dressing Wound #5 Left,Medial Lower Leg o Other: - Sorbact Wound #6 Left,Lateral Lower Leg o Other: - Sorbact Secondary Dressing Wound #5 Left,Medial Lower Leg o ABD pad Wound #6 Left,Lateral Lower Leg o ABD pad Dressing Change Frequency Wound #5 Left,Medial Lower Leg o Change dressing every week Wound #6 Left,Lateral Lower Leg o Change dressing every week Follow-up Appointments LAILANA, SHIRA ([id number]) Wound #5 Left,Medial Lower Leg o Return Appointment in 1 week. o Nurse Visit as needed Wound #6 Left,Lateral Lower Leg o Return Appointment in 1 week. o Nurse Visit as needed Edema Control Wound #5 Left,Medial Lower Leg o 3 Layer Compression System - Left Lower Extremity Wound #6 Left,Lateral Lower Leg o 3 Layer Compression System - Left Lower Extremity Off-Loading Wound #5 Left,Medial Lower Leg o Other: - Elevate legs as needed Wound #6 Left,Lateral Lower Leg o Other: - Elevate legs as needed Consults o Vascular -  [facility] Patient Medications Allergies: Sulfa (Sulfonamide Antibiotics), latex, Neoprene Notifications Medication Indication Start End Augmentin wound nfection 04/15/2019 DOSE oral 875 mg-125 mg tablet - 1 tablet oral bid for 10 days Electronic Signature(s) Signed: 04/15/2019 1:18:37 PM By: Linton Ham MD Entered By: Linton Ham on 04/15/2019 13:18:36 Every, [name] ([id number]) -------------------------------------------------------------------------------- Problem List Details Patient Name: JEMINA, [name]. Date of Service: 04/15/2019 12:45 PM Medical Record Number: [id number] Patient Account Number: [id number] Date of Birth/Sex: Mar 12, 1946 (73 y.o. F) Treating RN: Harold Barban Primary Care Provider: [name] Other Clinician: Referring Provider: [name] Treating Provider/Extender: Tito Dine in Treatment: 18 Active Problems ICD-10 Evaluated Encounter  Code Description Active Date Today Diagnosis L97.221 Non-pressure chronic ulcer of left calf limited to breakdown of 01/07/2019 No Yes skin I87.321 Chronic venous hypertension (idiopathic) with inflammation of 12/10/2018 No Yes right lower extremity I89.0 Lymphedema, not elsewhere classified 12/10/2018 No Yes Inactive Problems Resolved Problems Electronic Signature(s) Signed: 04/15/2019 4:01:30 PM By: Linton Ham MD Entered By: Linton Ham on 04/15/2019 13:11:37 Sherry, [name] ([id number]) -------------------------------------------------------------------------------- Progress Note Details Patient Name: TYMBER, STALLINGS. Date of Service: 04/15/2019 12:45 PM Medical Record Number: [id number] Patient Account Number: [id number] Date of Birth/Sex: 07-16-1946 (73 y.o. F) Treating RN: Harold Barban Primary Care Provider: [name] Other Clinician: Referring Provider: [name] Treating Provider/Extender: Tito Dine in Treatment: 18 Subjective History of Present  Illness (HPI) Pleasant 73 year old with history of chronic venous insufficiency. No diabetes or peripheral vascular disease. Left ABI 1.29. Questionable history of left lower extremity DVT. She developed a recurrent ulceration on her left lateral calf in December 2015, which she attributes to poor diet and subsequent lower extremity edema. She underwent endovenous laser ablation of her left greater saphenous vein in 2010. She underwent laser ablation of accessory branch of left GSV in April 2016 by Dr. Kellie Simmering at [facility]. She was previously wearing Unna boots, which she tolerated well. Tolerating 2 layer compression and cadexomer iodine. She returns to clinic for follow-up and is without new complaints. She denies any significant pain at this time. She reports persistent pain with pressure. No claudication or ischemic rest pain. No fever or chills. No drainage. READMISSION 11/13/16; this is a 73 year old woman who is not a diabetic. She is here for a review of a painful area on her left medial lower extremity. I note that she was seen here previously last year for wound I believe to be in the same area. At that time she had undergone previously a left greater saphenous vein ablation by Dr. Kellie Simmering and she had a ablation of the anterior accessory branch of the left greater saphenous vein in March 2016. Seeing that the wound actually closed over. In reviewing the history with her today the ulcer in this area has been recurrent. She describes a biopsy of this area in 2009 that only showed stasis physiology. 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 Do not see that she has ever had arterial studies in the left leg. She tells me she has a follow-up with Dr. Kellie Simmering in roughly 10  days In any case she developed the reopening of this area roughly a month ago. On the background of this she describes rapidly increasing edema which has responded to Lasix 40 mg and metolazone 2.5 mg as well as the patient's lymph massage. She has been told she has both venous insufficiency and lymphedema but she cannot tolerate compression stockings 11/28/16; the patient saw Dr. Kellie Simmering recently. Per the patient he did arterial Dopplers in the office that did not show evidence of arterial insufficiency, per the patient he stated "treat this like an ordinary venous ulcer". She also saw her dermatologist Dr. Ronnald Ramp who felt that this was more of a vascular ulcer. In general things are improving although she arrives today with increasing bilateral lower extremity edema with weeping a deeper fluid through the wound on the left medial leg compatible  with some degree of lymphedema 12/04/16; the patient's wound is fully epithelialized but I don't think fully healed. We will do another week of depression with Promogran and TCA however I suspect we'll be able to discharge her next week. This is a very unusual-looking wound which was initially a figure-of-eight type wound lying on its side surrounded by petechial like hemorrhage. She has had venous ablation on this side. She apparently does not have an arterial issue per Dr. Kellie Simmering. She saw her dermatologist thought it was "vascular". Patient is definitely going to need ongoing compression and I talked about this with her today she will go to elastic therapy after she leaves here next week 12/11/16; the patient's wound is not completely closed today. She has surrounding scar tissue and in further discussion with the patient it would appear that she had ulcers in this area in 2009 for a prolonged period of time ultimately requiring a punch biopsy of this area that only showed venous insufficiency. I did not previously pickup on this part of the history from  the patient. 12/18/16; the patient's wound is completely epithelialized. There is no open area here. She has significant bilateral venous insufficiency with secondary lymphedema to a mild-to-moderate degree she does not have compression stockings.. She did not say anything to me when I was in the room, she told our intake nurse that she was still having pain in this area. This isn't unusual recurrent small open area. She is going to go to elastic therapy to obtain compression stockings. 12/25/16; the patient's wound is fully epithelialized. There is no open area here. The patient describes some continued episodic discomfort in this area medial left calf. However everything looks fine and healed here. She is been to elastic therapy and [name], [name] ([id number]) caught herself 15-20 mmHg stockings, they apparently were having trouble getting 20-30 mm stockings in her size 01/22/17; this is a patient we discharged from the clinic a month ago. She has a recurrent open wound on her medial left calf. She had 15 mm support stockings. I told her I thought she needed 20-30 mm compression stockings. She tells me that she has been ill with hospitalization secondary to asthma and is been found to have severe hypokalemia likely secondary to a combination of Lasix and metolazone. This morning she noted blistering and leaking fluid on the posterior part of her left leg. She called our intake nurse urgently and we was saw her this afternoon. She has not had any real discomfort here. I don't know that she's been wearing any stockings on this leg for at least 2-3 days. ABIs in this clinic were 1.21 on the right and 1.3 on the left. She is previously seen vascular surgery who does not think that there is a peripheral arterial issue. 01/30/17; Patient arrives with no open wound on the left leg. She has been to elastic therapy and obtained 20-33mmhg below knee stockings and she has one on the right leg  today. READMISSION 02/19/18; this Elgersma is a now 73 year old patient we've had in this clinic perhaps 3 times before. I had last looked at her from January 07 December 2016 with an area on the medial left leg. We discharged her on 12/25/16 however she had to be readmitted on 01/22/17 with a recurrence. I have in my notes that we discharged her on 20-30 mm stockings although she tells me she was only wearing support hose because she cannot get stockings on predominantly related to her cervical spine  surgery/issues. She has had previous ablations done by vein and vascular in [place] including a great saphenous vein ablation on the left with an anterior accessory branch ablation I think both of these were in 2016. On one of the previous visit she had a biopsy noted 2009 that was negative. She is not felt to have an arterial issue. She is not a diabetic. She does have a history of obstructive sleep apnea hypertension asthma as well as chronic venous insufficiency and lymphedema. On this occasion she noted 2 dry scaly patch on her left leg. She tried to put lotion on this it didn't really help. There were 2 open areas.the patient has been seeing her primary physician from 02/05/18 through 02/14/18. She had Unna boots applied. The superior wound now on the lateral left leg has closed but she's had one wound that remains open on the lateral left leg. This is not the same spot as we dealt with in 2018. ABIs in this clinic were 1.3 bilaterally 02/26/18; patient has a small wound on the left lateral calf. Dimensions are down. She has chronic venous insufficiency and lymphedema. 03/05/18; small open area on the left lateral calf. Dimensions are down. Tightly adherent necrotic debris over the surface of the wound which was difficult to remove. Also the dressing [over collagen] stuck to the wound surface. This was removed with some difficulty as well. Change the primary dressing to Hydrofera Blue ready 03/12/18;  small open area on the left lateral calf. Comes in with tightly adherent surface eschar as well as some adherent Hydrofera Blue. 03/19/18; open area on the left lateral calf. Again adherent surface eschar as well as some adherent Hydrofera Blue nonviable subcutaneous tissue. She complained of pain all week even with the reduction from 4-3 layer compression I put on last week. Also she had an increase in her ankle and calf measurements probably related to the same thing. 03/26/18; open area on the left lateral calf. A very small open area remains here. We used silver alginate starting last week as the Hydrofera Blue seem to stick to the wound bed. In using 4-layer compression 04/02/18; the open area in the left lateral calf at some adherent slough which I removed there is no open area here. We are able to transition her into her own compression stocking. Truthfully I think this is probably his support hose. However this does not maintain skin integrity will be limited. She cannot put over the toe compression stockings on because of neck problems hand problems etc. She is allergic to the lining layer of juxta lites. We might be forced to use extremitease stocking should this fail READMIT 11/24/2018 Patient is now a 73 year old woman who is not a diabetic. She has been in this clinic on at least 3 previous occasions largely with recurrent wounds on her left leg secondary to chronic venous insufficiency with secondary lymphedema. Her situation is complicated by inability to get stockings on and an allergy to neoprene which is apparently a component and at least juxta lites and other stockings. As a result she really has not been wearing any stockings on her legs. She tells [place] that roughly 2 or 3 weeks ago she started noticing a stinging sensation just above her ankle on the left medial aspect. She has been diagnosed with pseudogout and she wondered whether this was what she was experiencing. She tried to  dress this with something she bought at the store however subsequently it pulled skin off and now she  has an open wound that is not improving. She has been using Vaseline gauze with a cover bandage. She saw her primary doctor last week who put an Haematologist on her. ABIs in this clinic was 1.03 on the left FREIDA, [name]. ([id number]) 2/12; the area is on the left medial ankle. Odd-looking wound with what looks to be surface epithelialization but a multitude of small petechial openings. This clearly not closed yet. We have been using silver alginate under 3 layer compression with TCA 2/19; the wound area did not look quite as good this week. Necrotic debris over the majority of the wound surface which required debridement. She continues to have a multitude of what looked to be small petechial openings. She reminds [place] that she had a biopsy on this initially during her first outbreak in 2015 in [place] dermat[facility]. She expresses concern about this being a possible melanoma. 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 I assured her that this does not look anything like melanoma. Except for the petechial reaction it does look like a venous insufficiency area and she certainly has evidence of this on both sides 2/26; a difficult area on the left medial ankle. The patient clearly has chronic venous hypertension with some degree of lymphedema. The odd thing about the area is the small petechial hemorrhages. I am not really sure how to explain this. This was present last time and this is not a compression injury. We have been using Hydrofera Blue which I changed to last week 3/4; still using Hydrofera Blue. Aggressive debridement today. She does not have known arterial issues. She has seen Dr. Kellie Simmering at [facility] vein and vascular and and has an ablation on the left. [Anterior accessory branch of the  greater saphenous]. From what I remember they did not feel she had an arterial issue. The patient has had this area biopsied in 2009 at [facility] dermatology and by her recollection they said this was "stasis". She is also follow-up with dermatology locally who thought that this was more of a vascular issue 3/11; using Hydrofera Blue. Aggressive debridement today. She does not have an arterial issue. We are using 3 layer compression although we may need to go to 4. The patient has been in for multiple changes to her wrap since I last saw her a week ago. She says that the area was leaking. I do not have too much more information on what was found 01/19/19 on evaluation today patient was actually being seen for a nurse visit when unfortunately she had the area on her left lateral lower extremity as well as weeping from the right lower extremity that became apparent. Therefore we did end up actually seeing her for a full visit with myself. She is having some pain at this site as well but fortunately nothing too significant at this point. No fevers, chills, nausea, or vomiting noted at this time. 3/18-Patient is back to the clinic with the left leg venous leg ulcer, the ulcer is larger in size, has a surface that is densely adherent with fibrinous tissue, the Hydrofera Blue was used but is densely adherent and there was difficulty in removing it. The right lower extremity was also wrapped for weeping edema. Patient has a new area over the left lateral foot above the malleolus that is small and appears to have no debris with intact surrounding skin. Patient is on increased dose of Lasix also  as a means to edema management 3/25; the patient has a nonhealing venous ulcer on the medial left leg and last week developed a smaller area on the lateral left calf. We have been using Hydrofera Blue with a contact layer. 4/1; no major change in these wounds areas. Left medial and more recently left lateral calf. I  tried Iodoflex last week to aid in debridement she did not tolerate this. She stated her pain was terrible all week. She took the top layer of the 4 layer compression off. 4/8; the patient actually looks somewhat better in terms of her more prominent left lateral calf wound. There is some healthy looking tissue here. She is still complaining of a lot of discomfort. 4/15; patient in a lot of pain secondary to sciatica. She is on a prednisone taper prescribed by her primary physician. She has the 2 areas one on the left medial and more recently a smaller area on the left lateral calf. Both of these just above the malleoli 4/22; her back pain is better but she still states she is very uncomfortable and now feels she is intolerant to the [employer]. No real change in the wounds we have been using Sorbact. She has been previously intolerant to Iodoflex. There is not a lot of option about what we can use to debride this wound under compression that she no doubt needs. sHe states Ultram no longer works for her pain 4/29; no major change in the wounds slightly increased depth. Surface on the original medial wound perhaps somewhat improved however the more recent area on the lateral left ankle is 100% covered in very adherent debris we have been using Sorbact. She tolerates 4 layer compression well and her edema control is a lot better. She has not had to come in for a nurse check 5/6; no major change in the condition of the wounds. She did consent to debridement today which was done with some difficulty. Continuing Sorbact. She did not tolerate Iodoflex. She was in for a check of her compression the day after we wrapped her last week this was adjusted but nothing much was found 5/13; no major change in the condition or area of the wounds. I was able to get a fairly aggressive debridement done on the lateral left leg wound. Even using Sorbact under compression. She came back in on Friday to have the wrap  changed. She says she felt uncomfortable on the lateral aspect of her ankle. She has a long history of chronic venous insufficiency including previous ablation surgery on this side. 5/20-Patient returns for wounds on left leg with both wounds covered in slough, with the lateral leg wound larger in size, she has been in 3 layer compression and felt more comfortable, she describes pain in ankle, in leg and pins and needles in foot, and is about to try Pamelor for this 6/3; wounds on the left lateral and left medial leg. The area medially which is the most recent of the 2 seems to have had the largest increase in dimensions. We have been using Sorbac to try and debride the surface. She has been to see orthopedics CODEE, BLOODWORTH ([id number]) they apparently did a plain x-ray that was indeterminant. Diagnosed her with neuropathy and they have ordered an MRI to determine if there is underlying osteomyelitis. This was not high on my thought list but I suppose it is prudent. We have advised her to make an appointment with vein and vascular in [place]. She has  a history of a left greater saphenous and accessory vein ablations I wonder if there is anything else that can be done from a surgical point of view to help in these difficult refractory wounds. We have previously healed this wound on one occasion but it keeps on reopening [medial side] 6/10; deep tissue culture I did last week I think on the left medial wound showed both moderate E. coli and moderate staph aureus [MSSA]. She is going to require antibiotics and I have chosen Augmentin. We have been using Sorbact and we have made better looking wound surface on both sides but certainly no improvement in wound area. She was back in last Friday apparently for a dressing changes the wrap was hurting her outer left ankle. She has not managed to get a hold of vein and vascular in [place]. We are going to have to make her that  appointment Objective Constitutional Sitting or standing Blood Pressure is within target range for patient.. Pulse regular and within target range for patient.Marland Kitchen Respirations regular, non-labored and within target range.. Temperature is normal and within the target range for the patient.Marland Kitchen appears in no distress. Vitals Time Taken: 12:43 PM, Height: 63 in, Weight: 224.7 lbs, BMI: 39.8, Temperature: 99.6 F, Pulse: 84 bpm, Respiratory Rate: 16 breaths/min, Blood Pressure: 139/77 mmHg. General Notes: Wound exam; left lateral and medial lower legs. Still a moderate amount of adherent debris which I debrided with an open curette. Again she does not tolerate debridement debridement well. The wound surfaces are still not 100% where I would like them but they are much improved. There is irritation around the wound which I think was the wrap issue she complained about last week. There does not look to be infection nevertheless I am concerned about the area around the left lateral calf wound Integumentary (Hair, Skin) Wound #5 status is Open. Original cause of wound was Gradually Appeared. The wound is located on the Left,Medial Lower Leg. The wound measures 2.7cm length x 2.6cm width x 0.5cm depth; 5.513cm^2 area and 2.757cm^3 volume. There is Fat Layer (Subcutaneous Tissue) Exposed exposed. There is no tunneling or undermining noted. There is a medium amount of purulent drainage noted. The wound margin is indistinct and nonvisible. There is small (1-33%) pink granulation within the wound bed. There is a large (67-100%) amount of necrotic tissue within the wound bed including Adherent Slough. Wound #6 status is Open. Original cause of wound was Gradually Appeared. The wound is located on the Left,Lateral Lower Leg. The wound measures 2.8cm length x 4.9cm width x 0.5cm depth; 10.776cm^2 area and 5.388cm^3 volume. There is Fat Layer (Subcutaneous Tissue) Exposed exposed. There is no tunneling or  undermining noted. There is a medium amount of purulent drainage noted. The wound margin is indistinct and nonvisible. There is small (1-33%) pink granulation within the wound bed. There is a large (67-100%) amount of necrotic tissue within the wound bed including Adherent Slough. Assessment Active Problems ICD-10 Non-pressure chronic ulcer of left calf limited to breakdown of skin DELYNDA, SEPULVEDA. ([id number]) Chronic venous hypertension (idiopathic) with inflammation of right lower extremity Lymphedema, not elsewhere classified Diagnoses ICD-10 L97.221: Non-pressure chronic ulcer of left calf limited to breakdown of skin I87.321: Chronic venous hypertension (idiopathic) with inflammation of right lower extremity I89.0: Lymphedema, not elsewhere classified Procedures Wound #5 Pre-procedure diagnosis of Wound #5 is a Lymphedema located on the Left,Medial Lower Leg . There was a Excisional Skin/Subcutaneous Tissue Debridement with a total area of 7.02 sq cm performed  by [name] Dillon, MD. With the following instrument(s): Curette to remove Viable and Non-Viable tissue/material. Material removed includes Subcutaneous Tissue and Slough and after achieving pain control using Lidocaine. No specimens were taken. A time out was conducted at 13:06, prior to the start of the procedure. A Minimum amount of bleeding was controlled with Pressure. The procedure was tolerated well with a pain level of 6 throughout and a pain level of 6 following the procedure. Post Debridement Measurements: 2.7cm length x 2.6cm width x 0.5cm depth; 2.757cm^3 volume. Character of Wound/Ulcer Post Debridement is improved. Post procedure Diagnosis Wound #5: Same as Pre-Procedure Wound #6 Pre-procedure diagnosis of Wound #6 is a Venous Leg Ulcer located on the Left,Lateral Lower Leg .Severity of Tissue Pre Debridement is: Fat layer exposed. There was a Excisional Skin/Subcutaneous Tissue Debridement with a total area of  13.72 sq cm performed by [name] Dillon, MD. With the following instrument(s): Curette to remove Viable and Non-Viable tissue/material. Material removed includes Subcutaneous Tissue and Slough and after achieving pain control using Lidocaine. No specimens were taken. A time out was conducted at 13:06, prior to the start of the procedure. A Minimum amount of bleeding was controlled with Pressure. The procedure was tolerated well with a pain level of 6 throughout and a pain level of 6 following the procedure. Post Debridement Measurements: 2.8cm length x 4.9cm width x 0.5cm depth; 5.388cm^3 volume. Character of Wound/Ulcer Post Debridement is improved. Severity of Tissue Post Debridement is: Fat layer exposed. Post procedure Diagnosis Wound #6: Same as Pre-Procedure Plan Wound Cleansing: Wound #5 Left,Medial Lower Leg: Clean wound with Normal Saline. May Shower, gently pat wound dry prior to applying new dressing. Wound #6 Left,Lateral Lower Leg: Clean wound with Normal Saline. May Shower, gently pat wound dry prior to applying new dressing. Anesthetic (add to Medication List): Wound #5 Left,Medial Lower Leg: Topical Lidocaine 4% cream applied to wound bed prior to debridement (In Clinic Only). Wound #6 Left,Lateral Lower Leg: Topical Lidocaine 4% cream applied to wound bed prior to debridement (In Clinic Only). BLAYKE, PINERA ([id number]) Skin Barriers/Peri-Wound Care: Wound #5 Left,Medial Lower Leg: Other: - Antibiotic ointment on irritated areas Primary Wound Dressing: Wound #5 Left,Medial Lower Leg: Other: - Sorbact Wound #6 Left,Lateral Lower Leg: Other: - Sorbact Secondary Dressing: Wound #5 Left,Medial Lower Leg: ABD pad Wound #6 Left,Lateral Lower Leg: ABD pad Dressing Change Frequency: Wound #5 Left,Medial Lower Leg: Change dressing every week Wound #6 Left,Lateral Lower Leg: Change dressing every week Follow-up Appointments: Wound #5 Left,Medial Lower Leg: Return  Appointment in 1 week. Nurse Visit as needed Wound #6 Left,Lateral Lower Leg: Return Appointment in 1 week. Nurse Visit as needed Edema Control: Wound #5 Left,Medial Lower Leg: 3 Layer Compression System - Left Lower Extremity Wound #6 Left,Lateral Lower Leg: 3 Layer Compression System - Left Lower Extremity Off-Loading: Wound #5 Left,Medial Lower Leg: Other: - Elevate legs as needed Wound #6 Left,Lateral Lower Leg: Other: - Elevate legs as needed Consults ordered were: Vascular - [facility] The following medication(s) was prescribed: Augmentin oral 875 mg-125 mg tablet 1 tablet oral bid for 10 days for wound nfection starting 04/15/2019 #1 Augmentin 875 twice daily for 10 days 2. We will attempt to make her the appointment with vein and vascular in [place]. She has nonhealing wounds with a history of chronic venous insufficiency and ablations. 3. Continue Sorbact for at least another week. The wound surfaces are looking better 4. Careful attention to the left lateral leg wound I am  a little concerned about the appearance of this area although it looks like a wrap abrasion Electronic Signature(s) Signed: 04/15/2019 1:19:02 PM By: Linton Ham MD Entered By: Linton Ham on 04/15/2019 13:19:02 MYLEI, BRACKEEN ([id number]) SOLYMAR, GRACE ([id number]) -------------------------------------------------------------------------------- SuperBill Details Patient Name: YVONE, SLAPE. Date of Service: 04/15/2019 Medical Record Number: [id number] Patient Account Number: [id number] Date of Birth/Sex: [birth year] (73 y.o. F) Treating RN: Harold Barban Primary Care Provider: [name] Other Clinician: Referring Provider: [name] Treating Provider/Extender: Tito Dine in Treatment: 18 Diagnosis Coding ICD-10 Codes Code Description [phone] Non-pressure chronic ulcer of left calf limited to breakdown of skin I87.321 Chronic venous hypertension (idiopathic)  with inflammation of right lower extremity I89.0 Lymphedema, not elsewhere classified Facility Procedures CPT4 Code: [id number] Description: 98921 - DEB SUBQ TISSUE 20 SQ CM/< ICD-10 Diagnosis Description L97.221 Non-pressure chronic ulcer of left calf limited to breakdown Modifier: of skin Quantity: 1 CPT4 Code: [id number] Description: 14481 - DEB SUBQ TISS EA ADDL 20CM ICD-10 Diagnosis Description L97.221 Non-pressure chronic ulcer of left calf limited to breakdown Modifier: of skin Quantity: 1 Physician Procedures CPT4 Code: [id number] Description: 11042 - WC PHYS SUBQ TISS 20 SQ CM ICD-10 Diagnosis Description L97.221 Non-pressure chronic ulcer of left calf limited to breakdown Modifier: of skin Quantity: 1 CPT4 Code: [id number] Description: 11045 - WC PHYS SUBQ TISS EA ADDL 20 CM ICD-10 Diagnosis Description L97.221 Non-pressure chronic ulcer of left calf limited to breakdown Modifier: of skin Quantity: 1 Electronic Signature(s) Signed: 04/15/2019 4:01:30 PM By: Linton Ham MD Entered By: Linton Ham on 04/15/2019 13:16:52

## 2019-04-17 ENCOUNTER — Ambulatory Visit: Payer: Medicare Other | Admitting: Family

## 2019-04-17 ENCOUNTER — Other Ambulatory Visit: Payer: Medicare Other

## 2019-04-17 NOTE — Progress Notes (Signed)
[name], [name] ([id number]) Visit Report for 04/15/2019 Arrival Information Details Patient Name: [name], [name]. Date of Service: 04/15/2019 12:45 PM Medical Record Number: [id number] Patient Account Number: [id number] Date of Birth/Sex: 02-11-1946 (73 y.o. F) Treating RN: [name] Primary Care [name]: [name] Other Clinician: Referring [name]: [name] Treating [name]/Extender: [name] in Treatment: 18 Visit Information History Since Last Visit Added or deleted any medications: No Patient Arrived: Ambulatory Any new allergies or adverse reactions: No Arrival Time: 12:41 Had a fall or experienced change in No Accompanied By: self activities of daily living that may affect Transfer Assistance: None risk of falls: Patient Identification Verified: Yes Signs or symptoms of abuse/neglect since last visito No Secondary Verification Process Completed: Yes Hospitalized since last visit: No Implantable device outside of the clinic excluding No cellular tissue based products placed in the center since last visit: Has Dressing in Place as Prescribed: Yes Has Compression in Place as Prescribed: Yes Pain Present Now: No Electronic Signature(s) Signed: 04/15/2019 3:08:26 PM By: [name] Entered By: [name] on 04/15/2019 12:42:54 Buys, [name] ([id number]) -------------------------------------------------------------------------------- Encounter Discharge Information Details Patient Name: [name], [name]. Date of Service: 04/15/2019 12:45 PM Medical Record Number: [id number] Patient Account Number: [id number] Date of Birth/Sex: [birth year] (73 y.o. F) Treating RN: [name] Primary Care Tenlee Wollin: [name] Other Clinician: Referring Arda [name]: [name] Treating [name]/Extender: [name] in Treatment: 18 Encounter Discharge Information Items Post Procedure Vitals Discharge Condition:  Stable Temperature (F): 99.6 Ambulatory Status: Ambulatory Pulse (bpm): 84 Discharge Destination: Home Respiratory Rate (breaths/min): 16 Transportation: Private Auto Blood Pressure (mmHg): 139/77 Accompanied By: self Schedule Follow-up Appointment: Yes Clinical Summary of Care: Electronic Signature(s) Signed: 04/17/2019 11:05:47 AM By: [name] Entered By: [name] on 04/15/2019 13:30:36 Azzaro, [name] ([id number]) -------------------------------------------------------------------------------- Lower Extremity Assessment Details Patient Name: [name], [name]. Date of Service: 04/15/2019 12:45 PM Medical Record Number: [id number] Patient Account Number: [id number] Date of Birth/Sex: 03/11/1946 (73 y.o. F) Treating RN: [name] Primary Care [name]: [name] Other Clinician: Referring [name]: [name] Treating [name]/Extender: [name] in Treatment: 18 Edema Assessment Assessed: [Left: No] [Right: No] Edema: [Left: N] [Right: o] Calf Left: Right: Point of Measurement: 31 cm From Medial Instep 39.4 cm cm Ankle Left: Right: Point of Measurement: 13 cm From Medial Instep 22.5 cm cm Vascular Assessment Pulses: Dorsalis Pedis Palpable: [Left:Yes] Electronic Signature(s) Signed: 04/15/2019 3:08:26 PM By: [name] Entered By: [name] on 04/15/2019 12:48:29 Banwart, [name] ([id number]) -------------------------------------------------------------------------------- Multi Wound Chart Details Patient Name: [name], [name]. Date of Service: 04/15/2019 12:45 PM Medical Record Number: [id number] Patient Account Number: [id number] Date of Birth/Sex: [birth year] (73 y.o. F) Treating RN: [name] Primary Care [name]: [name] Other Clinician: Referring [name]: [name] Treating [name]/Extender: [name] in Treatment: 18 Vital Signs Height(in): 63 Pulse(bpm): 24 Weight(lbs):  224.7 Blood Pressure(mmHg): 139/77 Body Mass Index(BMI): 40 Temperature(F): 99.6 Respiratory Rate 16 (breaths/min): Photos: [N/A:N/A] Wound Location: Left Lower Leg - Medial Left Lower Leg - Lateral N/A Wounding Event: Gradually Appeared Gradually Appeared N/A Primary Etiology: Lymphedema Venous Leg Ulcer N/A Comorbid History: Cataracts, Asthma, Sleep Cataracts, Asthma, Sleep N/A Apnea, Deep Vein Apnea, Deep Vein Thrombosis, Hypertension, Thrombosis, Hypertension, Peripheral Venous Disease, Peripheral Venous Disease, Osteoarthritis, Received Osteoarthritis, Received Chemotherapy, Received Chemotherapy, Received Radiation Radiation Date Acquired: 11/19/2018 01/19/2019 N/A Weeks of Treatment: 18 12 N/A Wound Status: Open Open N/A Measurements L x W x D 2.7x2.6x0.5 2.8x4.9x0.5 N/A (cm) Area (  cm) : 5.513 10.776 N/A Volume (cm) : 2.757 5.388 N/A % Reduction in Area: 35.50% -4798.20% N/A % Reduction in Volume: -222.50% -24390.90% N/A Classification: Full Thickness Without Full Thickness Without N/A Exposed Support Structures Exposed Support Structures Exudate Amount: Medium Medium N/A Exudate Type: Purulent Purulent N/A Exudate Color: yellow, brown, green yellow, brown, green N/A Wound Margin: Indistinct, nonvisible Indistinct, nonvisible N/A Granulation Amount: Small (1-33%) Small (1-33%) N/A Granulation Quality: Pink Pink N/A Necrotic Amount: Large (67-100%) Large (67-100%) N/A Exposed Structures: N/A Amber, [name] ([id number]) Fat Layer (Subcutaneous Fat Layer (Subcutaneous Tissue) Exposed: Yes Tissue) Exposed: Yes Fascia: No Fascia: No Tendon: No Tendon: No Muscle: No Muscle: No Joint: No Joint: No Bone: No Bone: No Epithelialization: None None N/A Debridement: Debridement - Excisional Debridement - Excisional N/A Pre-procedure 13:06 13:06 N/A Verification/Time Out Taken: Pain Control: Lidocaine Lidocaine N/A Tissue Debrided: Subcutaneous, Slough  Subcutaneous, Slough N/A Level: Skin/Subcutaneous Tissue Skin/Subcutaneous Tissue N/A Debridement Area (sq cm): 7.02 13.72 N/A Instrument: Curette Curette N/A Bleeding: Minimum Minimum N/A Hemostasis Achieved: Pressure Pressure N/A Procedural Pain: 6 6 N/A Post Procedural Pain: 6 6 N/A Debridement Treatment Procedure was tolerated well Procedure was tolerated well N/A Response: Post Debridement 2.7x2.6x0.5 2.8x4.9x0.5 N/A Measurements L x W x D (cm) Post Debridement Volume: 2.757 5.388 N/A (cm) Procedures Performed: Debridement Debridement N/A Treatment Notes Electronic Signature(s) Signed: 04/15/2019 4:01:30 PM By: Linton Ham MD Entered By: Linton Ham on 04/15/2019 13:11:44 Tacey, [name] ([id number]) -------------------------------------------------------------------------------- Multi-Disciplinary Care Plan Details Patient Name: ANALIYA, PORCO. Date of Service: 04/15/2019 12:45 PM Medical Record Number: [id number] Patient Account Number: [id number] Date of Birth/Sex: 01-29-1946 (73 y.o. F) Treating RN: [name] Primary Care Hayly Litsey: [name] Other Clinician: Referring Orest Dygert: [name] Treating Dashay Giesler/Extender: [name] in Treatment: 18 Active Inactive Orientation to the Wound Care Program Nursing Diagnoses: Knowledge deficit related to the wound healing center program Goals: Patient/caregiver will verbalize understanding of the [facility] Program Date Initiated: 12/10/2018 Target Resolution Date: 01/09/2019 Goal Status: Active Interventions: Provide education on orientation to the wound center Notes: Soft Tissue Infection Nursing Diagnoses: Impaired tissue integrity Goals: Patient's soft tissue infection will resolve Date Initiated: 12/10/2018 Target Resolution Date: 01/09/2019 Goal Status: Active Interventions: Assess signs and symptoms of infection every visit Notes: Venous Leg Ulcer Nursing  Diagnoses: Actual venous Insuffiency (use after diagnosis is confirmed) Goals: Patient will maintain optimal edema control Date Initiated: 12/10/2018 Target Resolution Date: 01/09/2019 Goal Status: Active Interventions: Assess peripheral edema status every visit. STEFANA, LODICO ([id number]) Treatment Activities: Therapeutic compression applied : 12/10/2018 Notes: Wound/Skin Impairment Nursing Diagnoses: Impaired tissue integrity Goals: Patient/caregiver will verbalize understanding of skin care regimen Date Initiated: 12/10/2018 Target Resolution Date: 01/09/2019 Goal Status: Active Interventions: Assess ulceration(s) every visit Treatment Activities: Topical wound management initiated : 12/10/2018 Notes: Electronic Signature(s) Signed: 04/15/2019 4:18:59 PM By: [name] Entered By: [name] on 04/15/2019 13:06:37 Subramaniam, [name] ([id number]) -------------------------------------------------------------------------------- Pain Assessment Details Patient Name: [name], [name]. Date of Service: 04/15/2019 12:45 PM Medical Record Number: [id number] Patient Account Number: [id number] Date of Birth/Sex: May 20, 1946 (73 y.o. F) Treating RN: [name] Primary Care Paytyn Mesta: [name] Other Clinician: Referring Layli Capshaw: [name] Treating Mitesh Rosendahl/Extender: [name] in Treatment: 18 Active Problems Location of Pain Severity and Description of Pain Patient Has Paino No Site Locations Pain Management and Medication Current Pain Management: Electronic Signature(s) Signed: 04/15/2019 3:08:26 PM By: [name] Entered By: [name] on 04/15/2019 12:43:05 Vandevoort, [name] ([id number]) -------------------------------------------------------------------------------- Patient/Caregiver  Education Details Patient Name: [name], [name]. Date of Service: 04/15/2019 12:45 PM Medical Record Number: [id number] Patient Account Number:  [id number] Date of Birth/Gender: September 17, 1946 (73 y.o. F) Treating RN: [name] Primary Care Physician: [name] Other Clinician: Referring Physician: Ria [name] Treating Physician/Extender: [name] in Treatment: 18 Education Assessment Education Provided To: Patient Education Topics Provided Wound/Skin Impairment: Handouts: Caring for Your Ulcer Methods: Demonstration, Explain/Verbal Responses: State content correctly Electronic Signature(s) Signed: 04/15/2019 4:18:59 PM By: [name] Entered By: [name] on 04/15/2019 13:03:49 Bozzi, [name] ([id number]) -------------------------------------------------------------------------------- Wound Assessment Details Patient Name: [name], [name]. Date of Service: 04/15/2019 12:45 PM Medical Record Number: [id number] Patient Account Number: [id number] Date of Birth/Sex: Mar 31, 1946 (73 y.o. F) Treating RN: [name] Primary Care Kazuma Elena: [name] Other Clinician: Referring Shaniece Bussa: [name] Treating Halee Glynn/Extender: [name] in Treatment: 18 Wound Status Wound Number: 5 Primary Lymphedema Etiology: Wound Location: Left Lower Leg - Medial Wound Open Wounding Event: Gradually Appeared Status: Date Acquired: 11/19/2018 Comorbid Cataracts, Asthma, Sleep Apnea, Deep Vein Weeks Of Treatment: 18 History: Thrombosis, Hypertension, Peripheral Venous Clustered Wound: No Disease, Osteoarthritis, Received Chemotherapy, Received Radiation Photos Wound Measurements Length: (cm) 2.7 Width: (cm) 2.6 Depth: (cm) 0.5 Area: (cm) 5.513 Volume: (cm) 2.757 % Reduction in Area: 35.5% % Reduction in Volume: -222.5% Epithelialization: None Tunneling: No Undermining: No Wound Description Full Thickness Without Exposed Support Classification: Structures Wound Margin: Indistinct, nonvisible Exudate Medium Amount: Exudate Type: Purulent Exudate Color:  yellow, brown, green Foul Odor After Cleansing: No Slough/Fibrino Yes Wound Bed Granulation Amount: Small (1-33%) Exposed Structure Granulation Quality: Pink Fascia Exposed: No Necrotic Amount: Large (67-100%) Fat Layer (Subcutaneous Tissue) Exposed: Yes Necrotic Quality: Adherent Slough Tendon Exposed: No Muscle Exposed: No Joint Exposed: No Bone Exposed: No [name], [name] ([id number]) Treatment Notes Wound #5 (Left, Medial Lower Leg) Notes sorbac, foam pad, abd, 3 layer, unna to [employer]) Signed: 04/15/2019 3:08:26 PM By: [name] Entered By: [name] on 04/15/2019 12:57:28 Stawicki, [name] ([id number]) -------------------------------------------------------------------------------- Wound Assessment Details Patient Name: [name], [name]. Date of Service: 04/15/2019 12:45 PM Medical Record Number: [id number] Patient Account Number: [id number] Date of Birth/Sex: [birth year] (73 y.o. F) Treating RN: [name] Primary Care Norell Brisbin: [name] Other Clinician: Referring Marquis Down: [name] Treating Haiden Rawlinson/Extender: [name] in Treatment: 18 Wound Status Wound Number: 6 Primary Venous Leg Ulcer Etiology: Wound Location: Left Lower Leg - Lateral Wound Open Wounding Event: Gradually Appeared Status: Date Acquired: 01/19/2019 Comorbid Cataracts, Asthma, Sleep Apnea, Deep Vein Weeks Of Treatment: 12 History: Thrombosis, Hypertension, Peripheral Venous Clustered Wound: No Disease, Osteoarthritis, Received Chemotherapy, Received Radiation Photos Wound Measurements Length: (cm) 2.8 Width: (cm) 4.9 Depth: (cm) 0.5 Area: (cm) 10.776 Volume: (cm) 5.388 % Reduction in Area: -4798.2% % Reduction in Volume: -24390.9% Epithelialization: None Tunneling: No Undermining: No Wound Description Full Thickness Without Exposed Support Classification: Structures Wound Margin: Indistinct,  nonvisible Exudate Medium Amount: Exudate Type: Purulent Exudate Color: yellow, brown, green Foul Odor After Cleansing: No Slough/Fibrino Yes Wound Bed Granulation Amount: Small (1-33%) Exposed Structure Granulation Quality: Pink Fascia Exposed: No Necrotic Amount: Large (67-100%) Fat Layer (Subcutaneous Tissue) Exposed: Yes Necrotic Quality: Adherent Slough Tendon Exposed: No Muscle Exposed: No Joint Exposed: No Bone Exposed: No [name], [name] ([id number]) Treatment Notes Wound #6 (Left, Lateral Lower Leg) Notes sorbac, foam pad, abd, 3 layer, unna to [employer]) Signed: 04/15/2019 3:08:26 PM By: [name] Entered By: [name] on 04/15/2019 12:58:06 [place], Petrea  Lenna Sciara ([id number]) -------------------------------------------------------------------------------- Vitals Details Patient Name: SHAYRA, ANTON. Date of Service: 04/15/2019 12:45 PM Medical Record Number: [id number] Patient Account Number: [id number] Date of Birth/Sex: 1945/11/16 (73 y.o. F) Treating RN: [name] Primary Care Raevin Wierenga: [name] Other Clinician: Referring Kelten Enochs: [name] Treating Chevelle Durr/Extender: [name] in Treatment: 18 Vital Signs Time Taken: 12:43 Temperature (F): 99.6 Height (in): 63 Pulse (bpm): 84 Weight (lbs): 224.7 Respiratory Rate (breaths/min): 16 Body Mass Index (BMI): 39.8 Blood Pressure (mmHg): 139/77 Reference Range: 80 - 120 mg / dl Electronic Signature(s) Signed: 04/15/2019 3:08:26 PM By: [name] Entered By: [name] on 04/15/2019 12:45:46

## 2019-04-21 ENCOUNTER — Ambulatory Visit
Admission: RE | Admit: 2019-04-21 | Discharge: 2019-04-21 | Disposition: A | Discharge status: Home / Self Care | Payer: Medicare Other | Source: Ambulatory Visit | Attending: Orthopedic Surgery | Admitting: Orthopedic Surgery

## 2019-04-21 ENCOUNTER — Other Ambulatory Visit: Payer: Self-pay

## 2019-04-21 ENCOUNTER — Other Ambulatory Visit: Payer: Self-pay | Admitting: Family Medicine

## 2019-04-21 DIAGNOSIS — M869 Osteomyelitis, unspecified: Secondary | ICD-10-CM

## 2019-04-21 DIAGNOSIS — R600 Localized edema: Secondary | ICD-10-CM | POA: Diagnosis not present

## 2019-04-22 ENCOUNTER — Encounter: Payer: Medicare Other | Admitting: Internal Medicine

## 2019-04-22 ENCOUNTER — Other Ambulatory Visit: Payer: Self-pay

## 2019-04-22 DIAGNOSIS — L97221 Non-pressure chronic ulcer of left calf limited to breakdown of skin: Secondary | ICD-10-CM | POA: Diagnosis not present

## 2019-04-22 DIAGNOSIS — I87312 Chronic venous hypertension (idiopathic) with ulcer of left lower extremity: Secondary | ICD-10-CM | POA: Diagnosis not present

## 2019-04-22 DIAGNOSIS — L97222 Non-pressure chronic ulcer of left calf with fat layer exposed: Secondary | ICD-10-CM | POA: Diagnosis not present

## 2019-04-22 DIAGNOSIS — I87332 Chronic venous hypertension (idiopathic) with ulcer and inflammation of left lower extremity: Secondary | ICD-10-CM | POA: Diagnosis not present

## 2019-04-22 DIAGNOSIS — L97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d: Secondary | ICD-10-CM | POA: Diagnosis not present

## 2019-04-22 DIAGNOSIS — I890 Lymphedema, not elsewhere classified: Secondary | ICD-10-CM | POA: Diagnosis not present

## 2019-04-23 NOTE — Progress Notes (Signed)
Amber, [name] ([id number]) Visit Report for 04/22/2019 Arrival Information Details Patient Name: [name], [name]. Date of Service: 04/22/2019 12:45 PM Medical Record Number: [id number] Patient Account Number: [id number] Date of Birth/Sex: Apr 12, 1946 (73 y.o. F) Treating RN: Montey Hora Primary Care Iolanda Folson: [name] Other Clinician: Referring Alfonzia Woolum: [name] Treating Vienne Corcoran/Extender: Tito Dine in Treatment: 61 Visit Information History Since Last Visit Added or deleted any medications: No Patient Arrived: Ambulatory Any new allergies or adverse reactions: No Arrival Time: 12:46 Had a fall or experienced change in No Accompanied By: self activities of daily living that may affect Transfer Assistance: None risk of falls: Patient Identification Verified: Yes Signs or symptoms of abuse/neglect since last visito No Secondary Verification Process Completed: Yes Hospitalized since last visit: No Implantable device outside of the clinic excluding No cellular tissue based products placed in the center since last visit: Has Dressing in Place as Prescribed: Yes Has Compression in Place as Prescribed: Yes Pain Present Now: No Electronic Signature(s) Signed: 04/22/2019 4:48:03 PM By: Montey Hora Entered By: Montey Hora on 04/22/2019 12:46:30 Ravelo, [name] ([id number]) -------------------------------------------------------------------------------- Encounter Discharge Information Details Patient Name: Amber, [name]. Date of Service: 04/22/2019 12:45 PM Medical Record Number: [id number] Patient Account Number: [id number] Date of Birth/Sex: 09/12/1946 (73 y.o. F) Treating RN: Montey Hora Primary Care Delois Tolbert: [name] Other Clinician: Referring Broghan Pannone: [name] Treating Jamieson Hetland/Extender: Tito Dine in Treatment: 19 Encounter Discharge Information Items Post Procedure Vitals Discharge Condition:  Stable Temperature (F): 98.5 Ambulatory Status: Ambulatory Pulse (bpm): 92 Discharge Destination: Home Respiratory Rate (breaths/min): 16 Transportation: Private Auto Blood Pressure (mmHg): 140/75 Accompanied By: self Schedule Follow-up Appointment: Yes Clinical Summary of Care: Electronic Signature(s) Signed: 04/22/2019 3:47:09 PM By: Montey Hora Entered By: Montey Hora on 04/22/2019 15:47:09 Loyal, [name] ([id number]) -------------------------------------------------------------------------------- Lower Extremity Assessment Details Patient Name: Amber, [name]. Date of Service: 04/22/2019 12:45 PM Medical Record Number: [id number] Patient Account Number: [id number] Date of Birth/Sex: 27-Jul-1946 (73 y.o. F) Treating RN: Montey Hora Primary Care Shakila Mak: [name] Other Clinician: Referring Anushka Hartinger: [name] Treating Katrena Stehlin/Extender: Tito Dine in Treatment: 19 Edema Assessment Assessed: [Left: No] [Right: No] Edema: [Left: Ye] [Right: s] Calf Left: Right: Point of Measurement: 31 cm From Medial Instep 39 cm cm Ankle Left: Right: Point of Measurement: 13 cm From Medial Instep 22.5 cm cm Vascular Assessment Pulses: Dorsalis Pedis Palpable: [Left:Yes] Electronic Signature(s) Signed: 04/22/2019 4:48:03 PM By: Montey Hora Entered By: Montey Hora on 04/22/2019 12:52:33 Falck, [name] ([id number]) -------------------------------------------------------------------------------- Multi Wound Chart Details Patient Name: Amber, [name]. Date of Service: 04/22/2019 12:45 PM Medical Record Number: [id number] Patient Account Number: [id number] Date of Birth/Sex: [birth year] (73 y.o. F) Treating RN: Cornell Barman Primary Care Tamirah George: [name] Other Clinician: Referring Caprice Wasko: [name] Treating Baylen Buckner/Extender: Tito Dine in Treatment: 19 Vital Signs Height(in): 63 Pulse(bpm): 68 Weight(lbs):  224.7 Blood Pressure(mmHg): 140/75 Body Mass Index(BMI): 40 Temperature(F): 98.5 Respiratory Rate 16 (breaths/min): Photos: [N/A:N/A] Wound Location: Left Lower Leg - Medial Left Lower Leg - Lateral N/A Wounding Event: Gradually Appeared Gradually Appeared N/A Primary Etiology: Lymphedema Venous Leg Ulcer N/A Comorbid History: Cataracts, Asthma, Sleep Cataracts, Asthma, Sleep N/A Apnea, Deep Vein Apnea, Deep Vein Thrombosis, Hypertension, Thrombosis, Hypertension, Peripheral Venous Disease, Peripheral Venous Disease, Osteoarthritis, Received Osteoarthritis, Received Chemotherapy, Received Chemotherapy, Received Radiation Radiation Date Acquired: 11/19/2018 01/19/2019 N/A Weeks of Treatment: 19 13 N/A Wound Status: Open Open N/A Measurements L x W x D 2.7x2.5x0.4 3.2x4.3x0.5 N/A (cm) Area (  cm) : 5.301 10.807 N/A Volume (cm) : 2.121 5.404 N/A % Reduction in Area: 38.00% -4812.30% N/A % Reduction in Volume: -148.10% -24463.60% N/A Classification: Full Thickness Without Full Thickness Without N/A Exposed Support Structures Exposed Support Structures Exudate Amount: Medium Medium N/A Exudate Type: Purulent Purulent N/A Exudate Color: yellow, brown, green yellow, brown, green N/A Wound Margin: Indistinct, nonvisible Indistinct, nonvisible N/A Granulation Amount: Small (1-33%) Small (1-33%) N/A Granulation Quality: Pink Pink N/A Necrotic Amount: Large (67-100%) Large (67-100%) N/A Exposed Structures: N/A MAURISA, TESMER ([id number]) Fat Layer (Subcutaneous Fat Layer (Subcutaneous Tissue) Exposed: Yes Tissue) Exposed: Yes Fascia: No Fascia: No Tendon: No Tendon: No Muscle: No Muscle: No Joint: No Joint: No Bone: No Bone: No Epithelialization: None None N/A Debridement: Debridement - Excisional Debridement - Excisional N/A Pre-procedure 13:14 13:14 N/A Verification/Time Out Taken: Pain Control: Lidocaine Lidocaine N/A Tissue Debrided: Subcutaneous, Slough  Subcutaneous, Slough N/A Level: Skin/Subcutaneous Tissue Skin/Subcutaneous Tissue N/A Debridement Area (sq cm): 6.75 13.76 N/A Instrument: Curette Curette N/A Bleeding: Minimum Minimum N/A Hemostasis Achieved: Pressure Pressure N/A Debridement Treatment Procedure was tolerated well Procedure was tolerated well N/A Response: Post Debridement 2.7x2.5x0.4 3.2x4.3x0.5 N/A Measurements L x W x D (cm) Post Debridement Volume: 2.121 5.404 N/A (cm) Procedures Performed: Debridement Debridement N/A Treatment Notes Electronic Signature(s) Signed: 04/22/2019 6:03:55 PM By: Linton Ham MD Entered By: Linton Ham on 04/22/2019 13:55:01 Beasley, [name] ([id number]) -------------------------------------------------------------------------------- Multi-Disciplinary Care Plan Details Patient Name: CARROLL, RANNEY. Date of Service: 04/22/2019 12:45 PM Medical Record Number: [id number] Patient Account Number: [id number] Date of Birth/Sex: Apr 16, 1946 (73 y.o. F) Treating RN: Cornell Barman Primary Care Sephira Zellman: [name] Other Clinician: Referring Ravyn Nikkel: [name] Treating Nami Strawder/Extender: Tito Dine in Treatment: 66 Active Inactive Orientation to the Wound Care Program Nursing Diagnoses: Knowledge deficit related to the wound healing center program Goals: Patient/caregiver will verbalize understanding of the [place] Program Date Initiated: 12/10/2018 Target Resolution Date: 01/09/2019 Goal Status: Active Interventions: Provide education on orientation to the wound center Notes: Soft Tissue Infection Nursing Diagnoses: Impaired tissue integrity Goals: Patient's soft tissue infection will resolve Date Initiated: 12/10/2018 Target Resolution Date: 01/09/2019 Goal Status: Active Interventions: Assess signs and symptoms of infection every visit Notes: Venous Leg Ulcer Nursing Diagnoses: Actual venous Insuffiency (use after diagnosis is  confirmed) Goals: Patient will maintain optimal edema control Date Initiated: 12/10/2018 Target Resolution Date: 01/09/2019 Goal Status: Active Interventions: Assess peripheral edema status every visit. DUNIA, PRINGLE ([id number]) Treatment Activities: Therapeutic compression applied : 12/10/2018 Notes: Wound/Skin Impairment Nursing Diagnoses: Impaired tissue integrity Goals: Patient/caregiver will verbalize understanding of skin care regimen Date Initiated: 12/10/2018 Target Resolution Date: 01/09/2019 Goal Status: Active Interventions: Assess ulceration(s) every visit Treatment Activities: Topical wound management initiated : 12/10/2018 Notes: Electronic Signature(s) Signed: 04/23/2019 4:58:07 PM By: [name], [name], RN, CWS, [name], [name] Entered By: [name], [name], RN, CWS, Kim on 04/22/2019 13:13:18 [name] ([id number]) -------------------------------------------------------------------------------- Pain Assessment Details Patient Name: ALAILAH, SAFLEY. Date of Service: 04/22/2019 12:45 PM Medical Record Number: [id number] Patient Account Number: [id number] Date of Birth/Sex: [birth year] (73 y.o. F) Treating RN: Montey Hora Primary Care Deboraha Goar: [name] Other Clinician: Referring Jeralyn Nolden: [name] Treating Azuri Bozard/Extender: Tito Dine in Treatment: 19 Active Problems Location of Pain Severity and Description of Pain Patient Has Paino Yes Site Locations Pain Location: Pain in Ulcers With Dressing Change: Yes Duration of the Pain. Constant / Intermittento Intermittent Pain Management and Medication Current Pain Management: Electronic Signature(s) Signed: 04/22/2019 4:48:03 PM By: Marjory Lies,  Di Kindle Entered By: Montey Hora on 04/22/2019 [place], [place] ([id number]) -------------------------------------------------------------------------------- Patient/Caregiver Education Details Patient Name: KRISTIANNE, ALBIN. Date of Service:  04/22/2019 12:45 PM Medical Record Number: [id number] Patient Account Number: [id number] Date of Birth/Gender: 1946/10/05 (73 y.o. F) Treating RN: Cornell Barman Primary Care Physician: [name] Other Clinician: Referring Physician: [name] Treating Physician/Extender: Tito Dine in Treatment: 100 Education Assessment Education Provided To: Patient Education Topics Provided Wound/Skin Impairment: Handouts: Caring for Your Ulcer Methods: Demonstration, Explain/Verbal Responses: State content correctly Electronic Signature(s) Signed: 04/23/2019 4:58:07 PM By: [name], [name], RN, CWS, [name], [name] Entered By: [name], [name], RN, CWS, Kim on 04/22/2019 13:17:48 [name] ([id number]) -------------------------------------------------------------------------------- Wound Assessment Details Patient Name: NATAKI, MCCRUMB. Date of Service: 04/22/2019 12:45 PM Medical Record Number: [id number] Patient Account Number: [id number] Date of Birth/Sex: June 21, 1946 (73 y.o. F) Treating RN: Montey Hora Primary Care Keandria Berrocal: [name] Other Clinician: Referring Veronica Guerrant: [name] Treating Annali Lybrand/Extender: Tito Dine in Treatment: 19 Wound Status Wound Number: 5 Primary Lymphedema Etiology: Wound Location: Left Lower Leg - Medial Wound Open Wounding Event: Gradually Appeared Status: Date Acquired: 11/19/2018 Comorbid Cataracts, Asthma, Sleep Apnea, Deep Vein Weeks Of Treatment: 19 History: Thrombosis, Hypertension, Peripheral Venous Clustered Wound: No Disease, Osteoarthritis, Received Chemotherapy, Received Radiation Photos Wound Measurements Length: (cm) 2.7 Width: (cm) 2.5 Depth: (cm) 0.4 Area: (cm) 5.301 Volume: (cm) 2.121 % Reduction in Area: 38% % Reduction in Volume: -148.1% Epithelialization: None Tunneling: No Undermining: No Wound Description Full Thickness Without Exposed Support Classification: Structures Wound  Margin: Indistinct, nonvisible Exudate Medium Amount: Exudate Type: Purulent Exudate Color: yellow, brown, green Foul Odor After Cleansing: No Slough/Fibrino Yes Wound Bed Granulation Amount: Small (1-33%) Exposed Structure Granulation Quality: Pink Fascia Exposed: No Necrotic Amount: Large (67-100%) Fat Layer (Subcutaneous Tissue) Exposed: Yes Necrotic Quality: Adherent Slough Tendon Exposed: No Muscle Exposed: No Joint Exposed: No Bone Exposed: No Esh, Berania J. ([id number]) Treatment Notes Wound #5 (Left, Medial Lower Leg) Notes sorbact, foam pad, abd, 3 layer, unna to [employer]) Signed: 04/22/2019 4:48:03 PM By: Montey Hora Entered By: Montey Hora on 04/22/2019 12:59:52 Speros, [name] ([id number]) -------------------------------------------------------------------------------- Wound Assessment Details Patient Name: [name], [name]. Date of Service: 04/22/2019 12:45 PM Medical Record Number: [id number] Patient Account Number: [id number] Date of Birth/Sex: [birth year] (73 y.o. F) Treating RN: Montey Hora Primary Care Domonique Brouillard: [name] Other Clinician: Referring Kaylia Winborne: [name] Treating Coumba Kellison/Extender: Tito Dine in Treatment: 19 Wound Status Wound Number: 6 Primary Venous Leg Ulcer Etiology: Wound Location: Left Lower Leg - Lateral Wound Open Wounding Event: Gradually Appeared Status: Date Acquired: 01/19/2019 Comorbid Cataracts, Asthma, Sleep Apnea, Deep Vein Weeks Of Treatment: 13 History: Thrombosis, Hypertension, Peripheral Venous Clustered Wound: No Disease, Osteoarthritis, Received Chemotherapy, Received Radiation Photos Wound Measurements Length: (cm) 3.2 Width: (cm) 4.3 Depth: (cm) 0.5 Area: (cm) 10.807 Volume: (cm) 5.404 % Reduction in Area: -4812.3% % Reduction in Volume: -24463.6% Epithelialization: None Tunneling: No Undermining: No Wound Description Full Thickness Without  Exposed Support Classification: Structures Wound Margin: Indistinct, nonvisible Exudate Medium Amount: Exudate Type: Purulent Exudate Color: yellow, brown, green Foul Odor After Cleansing: No Slough/Fibrino Yes Wound Bed Granulation Amount: Small (1-33%) Exposed Structure Granulation Quality: Pink Fascia Exposed: No Necrotic Amount: Large (67-100%) Fat Layer (Subcutaneous Tissue) Exposed: Yes Necrotic Quality: Adherent Slough Tendon Exposed: No Muscle Exposed: No Joint Exposed: No Bone Exposed: No Urda, Gissel J. ([id number]) Treatment Notes Wound #6 (Left, Lateral Lower Leg) Notes sorbact, foam pad, abd, 3  layer, unna to [employer]) Signed: 04/22/2019 4:48:03 PM By: Montey Hora Entered By: Montey Hora on 04/22/2019 13:00:18 [name] ([id number]) -------------------------------------------------------------------------------- Vitals Details Patient Name: CHANE, MAGNER. Date of Service: 04/22/2019 12:45 PM Medical Record Number: [id number] Patient Account Number: [id number] Date of Birth/Sex: [birth year] (73 y.o. F) Treating RN: Montey Hora Primary Care Eliazer Hemphill: [name] Other Clinician: Referring [name] Lyness: [name] Treating Keylen Eckenrode/Extender: Tito Dine in Treatment: 19 Vital Signs Time Taken: 12:50 Temperature (F): 98.5 Height (in): 63 Pulse (bpm): 92 Weight (lbs): 224.7 Respiratory Rate (breaths/min): 16 Body Mass Index (BMI): 39.8 Blood Pressure (mmHg): 140/75 Reference Range: 80 - 120 mg / dl Electronic Signature(s) Signed: 04/22/2019 4:48:03 PM By: Montey Hora Entered By: Montey Hora on 04/22/2019 12:52:48

## 2019-04-23 NOTE — Progress Notes (Signed)
KARNA, ABED ([id number]) Visit Report for 04/22/2019 Debridement Details Patient Name: [name], [name]. Date of Service: 04/22/2019 12:45 PM Medical Record Number: [id number] Patient Account Number: [id number] Date of Birth/Sex: [birth year] (73 y.o. F) Treating RN: Cornell Barman Primary Care Provider: [name] Other Clinician: Referring Provider: [name] Treating Provider/Extender: Tito Dine in Treatment: 19 Debridement Performed for Wound #5 Left,Medial Lower Leg Assessment: Performed By: Physician [name] Dillon, MD Debridement Type: Debridement Level of Consciousness (Pre- Awake and Alert procedure): Pre-procedure Verification/Time Yes - 13:14 Out Taken: Start Time: 13:14 Pain Control: Lidocaine Total Area Debrided (L x W): 2.7 (cm) x 2.5 (cm) = 6.75 (cm) Tissue and other material Viable, Non-Viable, Slough, Subcutaneous, Slough debrided: Level: Skin/Subcutaneous Tissue Debridement Description: Excisional Instrument: Curette Bleeding: Minimum Hemostasis Achieved: Pressure End Time: 13:16 Response to Treatment: Procedure was tolerated well Level of Consciousness Awake and Alert (Post-procedure): Post Debridement Measurements of Total Wound Length: (cm) 2.7 Width: (cm) 2.5 Depth: (cm) 0.4 Volume: (cm) 2.121 Character of Wound/Ulcer Post Debridement: Stable Post Procedure Diagnosis Same as Pre-procedure Electronic Signature(s) Signed: 04/22/2019 6:03:55 PM By: Linton Ham MD Signed: 04/23/2019 4:58:07 PM By: [name], [name], RN, CWS, [name], [name] Entered By: Linton Ham on 04/22/2019 13:55:11 Voight, [name] ([id number]) -------------------------------------------------------------------------------- Debridement Details Patient Name: [name], [name]. Date of Service: 04/22/2019 12:45 PM Medical Record Number: [id number] Patient Account Number: [id number] Date of Birth/Sex: Mar 01, 1946 (73 y.o. F) Treating RN: Cornell Barman Primary  Care Provider: [name] Other Clinician: Referring Provider: [name] Treating Provider/Extender: Tito Dine in Treatment: 19 Debridement Performed for Wound #6 Left,Lateral Lower Leg Assessment: Performed By: Physician [name] Dillon, MD Debridement Type: Debridement Severity of Tissue Pre Fat layer exposed Debridement: Level of Consciousness (Pre- Awake and Alert procedure): Pre-procedure Verification/Time Yes - 13:14 Out Taken: Start Time: 13:14 Pain Control: Lidocaine Total Area Debrided (L x W): 3.2 (cm) x 4.3 (cm) = 13.76 (cm) Tissue and other material Viable, Non-Viable, Slough, Subcutaneous, Slough debrided: Level: Skin/Subcutaneous Tissue Debridement Description: Excisional Instrument: Curette Bleeding: Minimum Hemostasis Achieved: Pressure End Time: 13:16 Response to Treatment: Procedure was tolerated well Level of Consciousness Awake and Alert (Post-procedure): Post Debridement Measurements of Total Wound Length: (cm) 3.2 Width: (cm) 4.3 Depth: (cm) 0.5 Volume: (cm) 5.404 Character of Wound/Ulcer Post Debridement: Stable Severity of Tissue Post Debridement: Fat layer exposed Post Procedure Diagnosis Same as Pre-procedure Electronic Signature(s) Signed: 04/22/2019 6:03:55 PM By: Linton Ham MD Signed: 04/23/2019 4:58:07 PM By: [name], [name], RN, CWS, [name], [name] Entered By: Linton Ham on 04/22/2019 13:55:23 [name], [name] ([id number]) -------------------------------------------------------------------------------- HPI Details Patient Name: [name], [name]. Date of Service: 04/22/2019 12:45 PM Medical Record Number: [id number] Patient Account Number: [id number] Date of Birth/Sex: 1946/08/22 (73 y.o. F) Treating RN: Cornell Barman Primary Care Provider: [name] Other Clinician: Referring Provider: [name] Treating Provider/Extender: Tito Dine in Treatment: 19 History of Present  Illness HPI Description: Pleasant 73 year old with history of chronic venous insufficiency. No diabetes or peripheral vascular disease. Left ABI 1.29. Questionable history of left lower extremity DVT. She developed a recurrent ulceration on her left lateral calf in December 2015, which she attributes to poor diet and subsequent lower extremity edema. She underwent endovenous laser ablation of her left greater saphenous vein in 2010. She underwent laser ablation of accessory branch of left GSV in April 2016 by Dr. Kellie Simmering at [facility]. She was previously wearing Unna boots, which she tolerated well. Tolerating 2 layer compression and cadexomer  iodine. She returns to clinic for follow-up and is without new complaints. She denies any significant pain at this time. She reports persistent pain with pressure. No claudication or ischemic rest pain. No fever or chills. No drainage. READMISSION 11/13/16; this is a 73 year old woman who is not a diabetic. She is here for a review of a painful area on her left medial lower extremity. I note that she was seen here previously last year for wound I believe to be in the same area. At that time she had undergone previously a left greater saphenous vein ablation by Dr. Kellie Simmering and she had a ablation of the anterior accessory branch of the left greater saphenous vein in March 2016. Seeing that the wound actually closed over. In reviewing the history with her today the ulcer in this area has been recurrent. She describes a biopsy of this area in 2009 that only showed stasis physiology. 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 Do not see that she has ever had arterial studies in the left leg. She tells me she has a follow-up with Dr. Kellie Simmering in roughly  10 days In any case she developed the reopening of this area roughly a month ago. On the background of this she describes rapidly increasing edema which has responded to Lasix 40 mg and metolazone 2.5 mg as well as the patient's lymph massage. She has been told she has both venous insufficiency and lymphedema but she cannot tolerate compression stockings 11/28/16; the patient saw Dr. Kellie Simmering recently. Per the patient he did arterial Dopplers in the office that did not show evidence of arterial insufficiency, per the patient he stated "treat this like an ordinary venous ulcer". She also saw her dermatologist Dr. Ronnald Ramp who felt that this was more of a vascular ulcer. In general things are improving although she arrives today with increasing bilateral lower extremity edema with weeping a deeper fluid through the wound on the left medial leg compatible with some degree of lymphedema 12/04/16; the patient's wound is fully epithelialized but I don't think fully healed. We will do another week of depression with Promogran and TCA however I suspect we'll be able to discharge her next week. This is a very unusual-looking wound which was initially a figure-of-eight type wound lying on its side surrounded by petechial like hemorrhage. She has had venous ablation on this side. She apparently does not have an arterial issue per Dr. Kellie Simmering. She saw her dermatologist thought it was "vascular". Patient is definitely going to need ongoing compression and I talked about this with her today she will go to elastic therapy after she leaves here next week 12/11/16; the patient's wound is not completely closed today. She has surrounding scar tissue and in further discussion with the patient it would appear that she had ulcers in this area in 2009 for a prolonged period of time ultimately requiring a punch biopsy of this area that only showed venous insufficiency. I did not previously pickup on this part of the history from  the patient. 12/18/16; the patient's wound is completely epithelialized. There is no open area here. She has significant bilateral venous insufficiency with secondary lymphedema to a mild-to-moderate degree she does not have compression stockings.. She did not say anything to me when I  was in the room, she told our intake nurse that she was still having pain in this area. This isn't unusual recurrent small open area. She is going to go to elastic therapy to obtain compression stockings. 12/25/16; the patient's wound is fully epithelialized. There is no open area here. The patient describes some continued episodic discomfort in this area medial left calf. However everything looks fine and healed here. She is been to elastic therapy and caught herself 15-20 mmHg stockings, they apparently were having trouble getting 20-30 mm stockings in her size [name], [name] ([id number]) 01/22/17; this is a patient we discharged from the clinic a month ago. She has a recurrent open wound on her medial left calf. She had 15 mm support stockings. I told her I thought she needed 20-30 mm compression stockings. She tells me that she has been ill with hospitalization secondary to asthma and is been found to have severe hypokalemia likely secondary to a combination of Lasix and metolazone. This morning she noted blistering and leaking fluid on the posterior part of her left leg. She called our intake nurse urgently and we was saw her this afternoon. She has not had any real discomfort here. I don't know that she's been wearing any stockings on this leg for at least 2-3 days. ABIs in this clinic were 1.21 on the right and 1.3 on the left. She is previously seen vascular surgery who does not think that there is a peripheral arterial issue. 01/30/17; Patient arrives with no open wound on the left leg. She has been to elastic therapy and obtained 20-68mmhg below knee stockings and she has one on the right leg  today. READMISSION 02/19/18; this Follmer is a now 73 year old patient we've had in this clinic perhaps 3 times before. I had last looked at her from January 07 December 2016 with an area on the medial left leg. We discharged her on 12/25/16 however she had to be readmitted on 01/22/17 with a recurrence. I have in my notes that we discharged her on 20-30 mm stockings although she tells me she was only wearing support hose because she cannot get stockings on predominantly related to her cervical spine surgery/issues. She has had previous ablations done by vein and vascular in [place] including a great saphenous vein ablation on the left with an anterior accessory branch ablation I think both of these were in 2016. On one of the previous visit she had a biopsy noted 2009 that was negative. She is not felt to have an arterial issue. She is not a diabetic. She does have a history of obstructive sleep apnea hypertension asthma as well as chronic venous insufficiency and lymphedema. On this occasion she noted 2 dry scaly patch on her left leg. She tried to put lotion on this it didn't really help. There were 2 open areas.the patient has been seeing her primary physician from 02/05/18 through 02/14/18. She had Unna boots applied. The superior wound now on the lateral left leg has closed but she's had one wound that remains open on the lateral left leg. This is not the same spot as we dealt with in 2018. ABIs in this clinic were 1.3 bilaterally 02/26/18; patient has a small wound on the left lateral calf. Dimensions are down. She has chronic venous insufficiency and lymphedema. 03/05/18; small open area on the left lateral calf. Dimensions are down. Tightly adherent necrotic debris over the surface of the wound which was difficult to remove. Also the dressing [over collagen]  stuck to the wound surface. This was removed with some difficulty as well. Change the primary dressing to Hydrofera Blue ready 03/12/18;  small open area on the left lateral calf. Comes in with tightly adherent surface eschar as well as some adherent Hydrofera Blue. 03/19/18; open area on the left lateral calf. Again adherent surface eschar as well as some adherent Hydrofera Blue nonviable subcutaneous tissue. She complained of pain all week even with the reduction from 4-3 layer compression I put on last week. Also she had an increase in her ankle and calf measurements probably related to the same thing. 03/26/18; open area on the left lateral calf. A very small open area remains here. We used silver alginate starting last week as the Hydrofera Blue seem to stick to the wound bed. In using 4-layer compression 04/02/18; the open area in the left lateral calf at some adherent slough which I removed there is no open area here. We are able to transition her into her own compression stocking. Truthfully I think this is probably his support hose. However this does not maintain skin integrity will be limited. She cannot put over the toe compression stockings on because of neck problems hand problems etc. She is allergic to the lining layer of juxta lites. We might be forced to use extremitease stocking should this fail READMIT 11/24/2018 Patient is now a 73 year old woman who is not a diabetic. She has been in this clinic on at least 3 previous occasions largely with recurrent wounds on her left leg secondary to chronic venous insufficiency with secondary lymphedema. Her situation is complicated by inability to get stockings on and an allergy to neoprene which is apparently a component and at least juxta lites and other stockings. As a result she really has not been wearing any stockings on her legs. She tells [place] that roughly 2 or 3 weeks ago she started noticing a stinging sensation just above her ankle on the left medial aspect. She has been diagnosed with pseudogout and she wondered whether this was what she was experiencing. She tried to  dress this with something she bought at the store however subsequently it pulled skin off and now she has an open wound that is not improving. She has been using Vaseline gauze with a cover bandage. She saw her primary doctor last week who put an Haematologist on her. ABIs in this clinic was 1.03 on the left ADITRI, LOUISCHARLES. ([id number]) 2/12; the area is on the left medial ankle. Odd-looking wound with what looks to be surface epithelialization but a multitude of small petechial openings. This clearly not closed yet. We have been using silver alginate under 3 layer compression with TCA 2/19; the wound area did not look quite as good this week. Necrotic debris over the majority of the wound surface which required debridement. She continues to have a multitude of what looked to be small petechial openings. She reminds [place] that she had a biopsy on this initially during her first outbreak in 2015 in [place] dermatology. She expresses concern about this being a possible melanoma. 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 I assured her that this does not look anything like melanoma. Except for the petechial reaction it does look like a venous insufficiency area and she certainly has evidence of this on both sides 2/26; a difficult area on the left medial ankle. The patient clearly has chronic venous hypertension  with some degree of lymphedema. The odd thing about the area is the small petechial hemorrhages. I am not really sure how to explain this. This was present last time and this is not a compression injury. We have been using Hydrofera Blue which I changed to last week 3/4; still using Hydrofera Blue. Aggressive debridement today. She does not have known arterial issues. She has seen Dr. Kellie Simmering at [facility] vein and vascular and and has an ablation on the left. [Anterior accessory branch of the  greater saphenous]. From what I remember they did not feel she had an arterial issue. The patient has had this area biopsied in 2009 at [facility] dermatology and by her recollection they said this was "stasis". She is also follow-up with dermatology locally who thought that this was more of a vascular issue 3/11; using Hydrofera Blue. Aggressive debridement today. She does not have an arterial issue. We are using 3 layer compression although we may need to go to 4. The patient has been in for multiple changes to her wrap since I last saw her a week ago. She says that the area was leaking. I do not have too much more information on what was found 01/19/19 on evaluation today patient was actually being seen for a nurse visit when unfortunately she had the area on her left lateral lower extremity as well as weeping from the right lower extremity that became apparent. Therefore we did end up actually seeing her for a full visit with myself. She is having some pain at this site as well but fortunately nothing too significant at this point. No fevers, chills, nausea, or vomiting noted at this time. 3/18-Patient is back to the clinic with the left leg venous leg ulcer, the ulcer is larger in size, has a surface that is densely adherent with fibrinous tissue, the Hydrofera Blue was used but is densely adherent and there was difficulty in removing it. The right lower extremity was also wrapped for weeping edema. Patient has a new area over the left lateral foot above the malleolus that is small and appears to have no debris with intact surrounding skin. Patient is on increased dose of Lasix also as a means to edema management 3/25; the patient has a nonhealing venous ulcer on the medial left leg and last week developed a smaller area on the lateral left calf. We have been using Hydrofera Blue with a contact layer. 4/1; no major change in these wounds areas. Left medial and more recently left lateral calf. I  tried Iodoflex last week to aid in debridement she did not tolerate this. She stated her pain was terrible all week. She took the top layer of the 4 layer compression off. 4/8; the patient actually looks somewhat better in terms of her more prominent left lateral calf wound. There is some healthy looking tissue here. She is still complaining of a lot of discomfort. 4/15; patient in a lot of pain secondary to sciatica. She is on a prednisone taper prescribed by her primary physician. She has the 2 areas one on the left medial and more recently a smaller area on the left lateral calf. Both of these just above the malleoli 4/22; her back pain is better but she still states she is very uncomfortable and now feels she is intolerant to the [employer]. No real change in the wounds we have been using Sorbact. She has been previously intolerant to Iodoflex. There is not a lot of option about what we  can use to debride this wound under compression that she no doubt needs. sHe states Ultram no longer works for her pain 4/29; no major change in the wounds slightly increased depth. Surface on the original medial wound perhaps somewhat improved however the more recent area on the lateral left ankle is 100% covered in very adherent debris we have been using Sorbact. She tolerates 4 layer compression well and her edema control is a lot better. She has not had to come in for a nurse check 5/6; no major change in the condition of the wounds. She did consent to debridement today which was done with some difficulty. Continuing Sorbact. She did not tolerate Iodoflex. She was in for a check of her compression the day after we wrapped her last week this was adjusted but nothing much was found 5/13; no major change in the condition or area of the wounds. I was able to get a fairly aggressive debridement done on the lateral left leg wound. Even using Sorbact under compression. She came back in on Friday to have the wrap  changed. She says she felt uncomfortable on the lateral aspect of her ankle. She has a long history of chronic venous insufficiency including previous ablation surgery on this side. 5/20-Patient returns for wounds on left leg with both wounds covered in slough, with the lateral leg wound larger in size, she has been in 3 layer compression and felt more comfortable, she describes pain in ankle, in leg and pins and needles in foot, and is about to try Pamelor for this 6/3; wounds on the left lateral and left medial leg. The area medially which is the most recent of the 2 seems to have had the largest increase in dimensions. We have been using Sorbac to try and debride the surface. She has been to see orthopedics they apparently did a plain x-ray that was indeterminant. Diagnosed her with neuropathy and they have ordered an MRI to [name], [name]. ([id number]) determine if there is underlying osteomyelitis. This was not high on my thought list but I suppose it is prudent. We have advised her to make an appointment with vein and vascular in [place]. She has a history of a left greater saphenous and accessory vein ablations I wonder if there is anything else that can be done from a surgical point of view to help in these difficult refractory wounds. We have previously healed this wound on one occasion but it keeps on reopening [medial side] 6/10; deep tissue culture I did last week I think on the left medial wound showed both moderate E. coli and moderate staph aureus [MSSA]. She is going to require antibiotics and I have chosen Augmentin. We have been using Sorbact and we have made better looking wound surface on both sides but certainly no improvement in wound area. She was back in last Friday apparently for a dressing changes the wrap was hurting her outer left ankle. She has not managed to get a hold of vein and vascular in [place]. We are going to have to make her that appointment 6/17;  patient is tolerating the Augmentin. She had an MRI that I think was ordered by orthopedic surgeon this did not show osteomyelitis or an abscess did suggest cellulitis. We have been using Sorbact to the lateral and medial ankles. We have been trying to arrange a follow-up appointment with vein and vascular in [place] or did her original ablations. We apparently an area sent the request to vein and vascular  in [employer]) Signed: 04/22/2019 6:03:55 PM By: Linton Ham MD Entered By: Linton Ham on 04/22/2019 13:56:27 [name], [name] ([id number]) -------------------------------------------------------------------------------- Physical Exam Details Patient Name: [name], [name]. Date of Service: 04/22/2019 12:45 PM Medical Record Number: [id number] Patient Account Number: [id number] Date of Birth/Sex: January 03, 1946 (73 y.o. F) Treating RN: Cornell Barman Primary Care Provider: [name] Other Clinician: Referring Provider: [name] Treating Provider/Extender: Tito Dine in Treatment: 19 Constitutional Sitting or standing Blood Pressure is within target range for patient.. Pulse regular and within target range for patient.Marland Kitchen Respirations regular, non-labored and within target range.. Temperature is normal and within the target range for the patient.Marland Kitchen appears in no distress. Cardiovascular Pedal pulses are palpable. Changes of chronic venous insufficiency but edema is well controlled. Notes Wound exam oThe original wound on the medial leg looks better. Still very adherent necrotic material. Using an open curette this was debrided to healthy looking granulation for the first time. She tolerates this very poorly. Hemostasis with direct pressure oThe subsequent wound is larger on the left lateral calf. Nevertheless the surface is better and I am able to debride some of this off as well although it is adherent. Electronic Signature(s) Signed:  04/22/2019 6:03:55 PM By: Linton Ham MD Entered By: Linton Ham on 04/22/2019 13:58:31 Friede, [name] ([id number]) -------------------------------------------------------------------------------- Physician Orders Details Patient Name: [name], [name]. Date of Service: 04/22/2019 12:45 PM Medical Record Number: [id number] Patient Account Number: [id number] Date of Birth/Sex: May 19, 1946 (73 y.o. F) Treating RN: Cornell Barman Primary Care Provider: [name] Other Clinician: Referring Provider: [name] Treating Provider/Extender: Tito Dine in Treatment: 29 Verbal / Phone Orders: No Diagnosis Coding Wound Cleansing Wound #5 Left,Medial Lower Leg o Clean wound with Normal Saline. o May Shower, gently pat wound dry prior to applying new dressing. Wound #6 Left,Lateral Lower Leg o Clean wound with Normal Saline. o May Shower, gently pat wound dry prior to applying new dressing. Anesthetic (add to Medication List) Wound #5 Left,Medial Lower Leg o Topical Lidocaine 4% cream applied to wound bed prior to debridement (In Clinic Only). Wound #6 Left,Lateral Lower Leg o Topical Lidocaine 4% cream applied to wound bed prior to debridement (In Clinic Only). Skin Barriers/Peri-Wound Care Wound #5 Left,Medial Lower Leg o Other: - Antibiotic ointment on irritated areas Wound #6 Left,Lateral Lower Leg o Other: - Antibiotic ointment on irritated areas Primary Wound Dressing Wound #5 Left,Medial Lower Leg o Other: - Sorbact Wound #6 Left,Lateral Lower Leg o Other: - Sorbact Secondary Dressing Wound #5 Left,Medial Lower Leg o ABD pad Wound #6 Left,Lateral Lower Leg o ABD pad Dressing Change Frequency Wound #5 Left,Medial Lower Leg o Change dressing every week Wound #6 Left,Lateral Lower Leg o Change dressing every week ANELI, ZARA ([id number]) Follow-up Appointments Wound #5 Left,Medial Lower Leg o Return Appointment in  1 week. o Nurse Visit as needed Wound #6 Left,Lateral Lower Leg o Return Appointment in 1 week. o Nurse Visit as needed Edema Control Wound #5 Left,Medial Lower Leg o 3 Layer Compression System - Left Lower Extremity Wound #6 Left,Lateral Lower Leg o 3 Layer Compression System - Left Lower Extremity Off-Loading Wound #5 Left,Medial Lower Leg o Other: - Elevate legs as needed Wound #6 Left,Lateral Lower Leg o Other: - Elevate legs as needed Medications-please add to medication list. Wound #5 Left,Medial Lower Leg o P.O. Antibiotics - complete antibiotics Wound #6 Left,Lateral Lower Leg o P.O. Antibiotics - complete antibiotics Notes VOB for Apligraf Electronic Signature(s)  Signed: 04/22/2019 6:03:55 PM By: Linton Ham MD Signed: 04/23/2019 4:58:07 PM By: [name], [name], RN, CWS, [name], [name] Entered By: [name], [name], RN, CWS, Kim on 04/22/2019 13:20:09 [name], [name] ([id number]) -------------------------------------------------------------------------------- Problem List Details Patient Name: [name], [name]. Date of Service: 04/22/2019 12:45 PM Medical Record Number: [id number] Patient Account Number: [id number] Date of Birth/Sex: 1946/07/23 (73 y.o. F) Treating RN: Cornell Barman Primary Care Provider: [name] Other Clinician: Referring Provider: [name] Treating Provider/Extender: Tito Dine in Treatment: 19 Active Problems ICD-10 Evaluated Encounter Code Description Active Date Today Diagnosis L97.221 Non-pressure chronic ulcer of left calf limited to breakdown of 01/07/2019 No Yes skin I87.321 Chronic venous hypertension (idiopathic) with inflammation of 12/10/2018 No Yes right lower extremity I89.0 Lymphedema, not elsewhere classified 12/10/2018 No Yes Inactive Problems Resolved Problems Electronic Signature(s) Signed: 04/22/2019 6:03:55 PM By: Linton Ham MD Entered By: Linton Ham on 04/22/2019 13:54:51 Brownlow, [name] ([id number]) -------------------------------------------------------------------------------- Progress Note Details Patient Name: [name], [name]. Date of Service: 04/22/2019 12:45 PM Medical Record Number: [id number] Patient Account Number: [id number] Date of Birth/Sex: 1946-04-19 (73 y.o. F) Treating RN: Cornell Barman Primary Care Provider: [name] Other Clinician: Referring Provider: [name] Treating Provider/Extender: Tito Dine in Treatment: 19 Subjective History of Present Illness (HPI) Pleasant 73 year old with history of chronic venous insufficiency. No diabetes or peripheral vascular disease. Left ABI 1.29. Questionable history of left lower extremity DVT. She developed a recurrent ulceration on her left lateral calf in December 2015, which she attributes to poor diet and subsequent lower extremity edema. She underwent endovenous laser ablation of her left greater saphenous vein in 2010. She underwent laser ablation of accessory branch of left GSV in April 2016 by Dr. Kellie Simmering at [facility]. She was previously wearing Unna boots, which she tolerated well. Tolerating 2 layer compression and cadexomer iodine. She returns to clinic for follow-up and is without new complaints. She denies any significant pain at this time. She reports persistent pain with pressure. No claudication or ischemic rest pain. No fever or chills. No drainage. READMISSION 11/13/16; this is a 73 year old woman who is not a diabetic. She is here for a review of a painful area on her left medial lower extremity. I note that she was seen here previously last year for wound I believe to be in the same area. At that time she had undergone previously a left greater saphenous vein ablation by Dr. Kellie Simmering and she had a ablation of the anterior accessory branch of the left greater saphenous vein in March 2016. Seeing that the wound actually closed over. In reviewing the history with her today the  ulcer in this area has been recurrent. She describes a biopsy of this area in 2009 that only showed stasis physiology. 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 Do not see that she has ever had arterial studies in the left leg. She tells me she has a follow-up with Dr. Kellie Simmering in roughly 10 days In any case she developed the reopening of this area roughly a month ago. On the background of this she describes rapidly increasing edema which has responded to Lasix 40 mg and metolazone 2.5 mg as well as the patient's lymph massage. She has been told she has both  venous insufficiency and lymphedema but she cannot tolerate compression stockings 11/28/16; the patient saw Dr. Kellie Simmering recently. Per the patient he did arterial Dopplers in the office that did not show evidence of arterial insufficiency, per the patient he stated "treat this like an ordinary venous ulcer". She also saw her dermatologist Dr. Ronnald Ramp who felt that this was more of a vascular ulcer. In general things are improving although she arrives today with increasing bilateral lower extremity edema with weeping a deeper fluid through the wound on the left medial leg compatible with some degree of lymphedema 12/04/16; the patient's wound is fully epithelialized but I don't think fully healed. We will do another week of depression with Promogran and TCA however I suspect we'll be able to discharge her next week. This is a very unusual-looking wound which was initially a figure-of-eight type wound lying on its side surrounded by petechial like hemorrhage. She has had venous ablation on this side. She apparently does not have an arterial issue per Dr. Kellie Simmering. She saw her dermatologist thought it was "vascular". Patient is definitely  going to need ongoing compression and I talked about this with her today she will go to elastic therapy after she leaves here next week 12/11/16; the patient's wound is not completely closed today. She has surrounding scar tissue and in further discussion with the patient it would appear that she had ulcers in this area in 2009 for a prolonged period of time ultimately requiring a punch biopsy of this area that only showed venous insufficiency. I did not previously pickup on this part of the history from the patient. 12/18/16; the patient's wound is completely epithelialized. There is no open area here. She has significant bilateral venous insufficiency with secondary lymphedema to a mild-to-moderate degree she does not have compression stockings.. She did not say anything to me when I was in the room, she told our intake nurse that she was still having pain in this area. This isn't unusual recurrent small open area. She is going to go to elastic therapy to obtain compression stockings. 12/25/16; the patient's wound is fully epithelialized. There is no open area here. The patient describes some continued episodic discomfort in this area medial left calf. However everything looks fine and healed here. She is been to elastic therapy and [name], [name] ([id number]) caught herself 15-20 mmHg stockings, they apparently were having trouble getting 20-30 mm stockings in her size 01/22/17; this is a patient we discharged from the clinic a month ago. She has a recurrent open wound on her medial left calf. She had 15 mm support stockings. I told her I thought she needed 20-30 mm compression stockings. She tells me that she has been ill with hospitalization secondary to asthma and is been found to have severe hypokalemia likely secondary to a combination of Lasix and metolazone. This morning she noted blistering and leaking fluid on the posterior part of her left leg. She called our intake nurse urgently and we  was saw her this afternoon. She has not had any real discomfort here. I don't know that she's been wearing any stockings on this leg for at least 2-3 days. ABIs in this clinic were 1.21 on the right and 1.3 on the left. She is previously seen vascular surgery who does not think that there is a peripheral arterial issue. 01/30/17; Patient arrives with no open wound on the left leg. She has been to elastic therapy and obtained 20-50mmhg below knee stockings and she has one  on the right leg today. READMISSION 02/19/18; this Anglada is a now 73 year old patient we've had in this clinic perhaps 3 times before. I had last looked at her from January 07 December 2016 with an area on the medial left leg. We discharged her on 12/25/16 however she had to be readmitted on 01/22/17 with a recurrence. I have in my notes that we discharged her on 20-30 mm stockings although she tells me she was only wearing support hose because she cannot get stockings on predominantly related to her cervical spine surgery/issues. She has had previous ablations done by vein and vascular in [place] including a great saphenous vein ablation on the left with an anterior accessory branch ablation I think both of these were in 2016. On one of the previous visit she had a biopsy noted 2009 that was negative. She is not felt to have an arterial issue. She is not a diabetic. She does have a history of obstructive sleep apnea hypertension asthma as well as chronic venous insufficiency and lymphedema. On this occasion she noted 2 dry scaly patch on her left leg. She tried to put lotion on this it didn't really help. There were 2 open areas.the patient has been seeing her primary physician from 02/05/18 through 02/14/18. She had Unna boots applied. The superior wound now on the lateral left leg has closed but she's had one wound that remains open on the lateral left leg. This is not the same spot as we dealt with in 2018. ABIs in this clinic were  1.3 bilaterally 02/26/18; patient has a small wound on the left lateral calf. Dimensions are down. She has chronic venous insufficiency and lymphedema. 03/05/18; small open area on the left lateral calf. Dimensions are down. Tightly adherent necrotic debris over the surface of the wound which was difficult to remove. Also the dressing [over collagen] stuck to the wound surface. This was removed with some difficulty as well. Change the primary dressing to Hydrofera Blue ready 03/12/18; small open area on the left lateral calf. Comes in with tightly adherent surface eschar as well as some adherent Hydrofera Blue. 03/19/18; open area on the left lateral calf. Again adherent surface eschar as well as some adherent Hydrofera Blue nonviable subcutaneous tissue. She complained of pain all week even with the reduction from 4-3 layer compression I put on last week. Also she had an increase in her ankle and calf measurements probably related to the same thing. 03/26/18; open area on the left lateral calf. A very small open area remains here. We used silver alginate starting last week as the Hydrofera Blue seem to stick to the wound bed. In using 4-layer compression 04/02/18; the open area in the left lateral calf at some adherent slough which I removed there is no open area here. We are able to transition her into her own compression stocking. Truthfully I think this is probably his support hose. However this does not maintain skin integrity will be limited. She cannot put over the toe compression stockings on because of neck problems hand problems etc. She is allergic to the lining layer of juxta lites. We might be forced to use extremitease stocking should this fail READMIT 11/24/2018 Patient is now a 73 year old woman who is not a diabetic. She has been in this clinic on at least 3 previous occasions largely with recurrent wounds on her left leg secondary to chronic venous insufficiency with secondary  lymphedema. Her situation is complicated by inability to get stockings on and  an allergy to neoprene which is apparently a component and at least juxta lites and other stockings. As a result she really has not been wearing any stockings on her legs. She tells [place] that roughly 2 or 3 weeks ago she started noticing a stinging sensation just above her ankle on the left medial aspect. She has been diagnosed with pseudogout and she wondered whether this was what she was experiencing. She tried to dress this with something she bought at the store however subsequently it pulled skin off and now she has an open wound that is not improving. She has been using Vaseline gauze with a cover bandage. She saw her primary doctor last week who put an Haematologist on her. ABIs in this clinic was 1.03 on the left KASHLYNN, KUNDERT. ([id number]) 2/12; the area is on the left medial ankle. Odd-looking wound with what looks to be surface epithelialization but a multitude of small petechial openings. This clearly not closed yet. We have been using silver alginate under 3 layer compression with TCA 2/19; the wound area did not look quite as good this week. Necrotic debris over the majority of the wound surface which required debridement. She continues to have a multitude of what looked to be small petechial openings. She reminds [place] that she had a biopsy on this initially during her first outbreak in 2015 in [facility]. She expresses concern about this being a possible melanoma. 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 I assured her that this does not look anything like melanoma. Except for the petechial reaction it does look like a venous insufficiency area and she certainly has evidence of this on both sides 2/26; a difficult area on the left medial ankle. The patient clearly has chronic venous hypertension with some degree  of lymphedema. The odd thing about the area is the small petechial hemorrhages. I am not really sure how to explain this. This was present last time and this is not a compression injury. We have been using Hydrofera Blue which I changed to last week 3/4; still using Hydrofera Blue. Aggressive debridement today. She does not have known arterial issues. She has seen Dr. Kellie Simmering at [facility] vein and vascular and and has an ablation on the left. [Anterior accessory branch of the greater saphenous]. From what I remember they did not feel she had an arterial issue. The patient has had this area biopsied in 2009 at [facility] dermatology and by her recollection they said this was "stasis". She is also follow-up with dermatology locally who thought that this was more of a vascular issue 3/11; using Hydrofera Blue. Aggressive debridement today. She does not have an arterial issue. We are using 3 layer compression although we may need to go to 4. The patient has been in for multiple changes to her wrap since I last saw her a week ago. She says that the area was leaking. I do not have too much more information on what was found 01/19/19 on evaluation today patient was actually being seen for a nurse visit when unfortunately she had the area on her left lateral lower extremity as well as weeping from the right lower extremity that became apparent. Therefore we did end up actually seeing her for a full visit with myself. She is having some pain at this site as well but fortunately nothing too significant at this point. No fevers, chills, nausea, or vomiting  noted at this time. 3/18-Patient is back to the clinic with the left leg venous leg ulcer, the ulcer is larger in size, has a surface that is densely adherent with fibrinous tissue, the Hydrofera Blue was used but is densely adherent and there was difficulty in removing it. The right lower extremity was also wrapped for weeping edema. Patient has a new area  over the left lateral foot above the malleolus that is small and appears to have no debris with intact surrounding skin. Patient is on increased dose of Lasix also as a means to edema management 3/25; the patient has a nonhealing venous ulcer on the medial left leg and last week developed a smaller area on the lateral left calf. We have been using Hydrofera Blue with a contact layer. 4/1; no major change in these wounds areas. Left medial and more recently left lateral calf. I tried Iodoflex last week to aid in debridement she did not tolerate this. She stated her pain was terrible all week. She took the top layer of the 4 layer compression off. 4/8; the patient actually looks somewhat better in terms of her more prominent left lateral calf wound. There is some healthy looking tissue here. She is still complaining of a lot of discomfort. 4/15; patient in a lot of pain secondary to sciatica. She is on a prednisone taper prescribed by her primary physician. She has the 2 areas one on the left medial and more recently a smaller area on the left lateral calf. Both of these just above the malleoli 4/22; her back pain is better but she still states she is very uncomfortable and now feels she is intolerant to the [employer]. No real change in the wounds we have been using Sorbact. She has been previously intolerant to Iodoflex. There is not a lot of option about what we can use to debride this wound under compression that she no doubt needs. sHe states Ultram no longer works for her pain 4/29; no major change in the wounds slightly increased depth. Surface on the original medial wound perhaps somewhat improved however the more recent area on the lateral left ankle is 100% covered in very adherent debris we have been using Sorbact. She tolerates 4 layer compression well and her edema control is a lot better. She has not had to come in for a nurse check 5/6; no major change in the condition of the wounds.  She did consent to debridement today which was done with some difficulty. Continuing Sorbact. She did not tolerate Iodoflex. She was in for a check of her compression the day after we wrapped her last week this was adjusted but nothing much was found 5/13; no major change in the condition or area of the wounds. I was able to get a fairly aggressive debridement done on the lateral left leg wound. Even using Sorbact under compression. She came back in on Friday to have the wrap changed. She says she felt uncomfortable on the lateral aspect of her ankle. She has a long history of chronic venous insufficiency including previous ablation surgery on this side. 5/20-Patient returns for wounds on left leg with both wounds covered in slough, with the lateral leg wound larger in size, she has been in 3 layer compression and felt more comfortable, she describes pain in ankle, in leg and pins and needles in foot, and is about to try Pamelor for this 6/3; wounds on the left lateral and left medial leg. The area medially which  is the most recent of the 2 seems to have had the largest increase in dimensions. We have been using Sorbac to try and debride the surface. She has been to see orthopedics QUANEISHA, HANISCH ([id number]) they apparently did a plain x-ray that was indeterminant. Diagnosed her with neuropathy and they have ordered an MRI to determine if there is underlying osteomyelitis. This was not high on my thought list but I suppose it is prudent. We have advised her to make an appointment with vein and vascular in [place]. She has a history of a left greater saphenous and accessory vein ablations I wonder if there is anything else that can be done from a surgical point of view to help in these difficult refractory wounds. We have previously healed this wound on one occasion but it keeps on reopening [medial side] 6/10; deep tissue culture I did last week I think on the left medial wound showed both  moderate E. coli and moderate staph aureus [MSSA]. She is going to require antibiotics and I have chosen Augmentin. We have been using Sorbact and we have made better looking wound surface on both sides but certainly no improvement in wound area. She was back in last Friday apparently for a dressing changes the wrap was hurting her outer left ankle. She has not managed to get a hold of vein and vascular in [place]. We are going to have to make her that appointment 6/17; patient is tolerating the Augmentin. She had an MRI that I think was ordered by orthopedic surgeon this did not show osteomyelitis or an abscess did suggest cellulitis. We have been using Sorbact to the lateral and medial ankles. We have been trying to arrange a follow-up appointment with vein and vascular in [place] or did her original ablations. We apparently an area sent the request to vein and vascular in [place] Objective Constitutional Sitting or standing Blood Pressure is within target range for patient.. Pulse regular and within target range for patient.Marland Kitchen Respirations regular, non-labored and within target range.. Temperature is normal and within the target range for the patient.Marland Kitchen appears in no distress. Vitals Time Taken: 12:50 PM, Height: 63 in, Weight: 224.7 lbs, BMI: 39.8, Temperature: 98.5 F, Pulse: 92 bpm, Respiratory Rate: 16 breaths/min, Blood Pressure: 140/75 mmHg. Cardiovascular Pedal pulses are palpable. Changes of chronic venous insufficiency but edema is well controlled. General Notes: Wound exam The original wound on the medial leg looks better. Still very adherent necrotic material. Using an open curette this was debrided to healthy looking granulation for the first time. She tolerates this very poorly. Hemostasis with direct pressure The subsequent wound is larger on the left lateral calf. Nevertheless the surface is better and I am able to debride some of this off as well although it is  adherent. Integumentary (Hair, Skin) Wound #5 status is Open. Original cause of wound was Gradually Appeared. The wound is located on the Left,Medial Lower Leg. The wound measures 2.7cm length x 2.5cm width x 0.4cm depth; 5.301cm^2 area and 2.121cm^3 volume. There is Fat Layer (Subcutaneous Tissue) Exposed exposed. There is no tunneling or undermining noted. There is a medium amount of purulent drainage noted. The wound margin is indistinct and nonvisible. There is small (1-33%) pink granulation within the wound bed. There is a large (67-100%) amount of necrotic tissue within the wound bed including Adherent Slough. Wound #6 status is Open. Original cause of wound was Gradually Appeared. The wound is located on the Left,Lateral Lower Leg. The wound measures  3.2cm length x 4.3cm width x 0.5cm depth; 10.807cm^2 area and 5.404cm^3 volume. There is Fat Layer (Subcutaneous Tissue) Exposed exposed. There is no tunneling or undermining noted. There is a medium amount of purulent drainage noted. The wound margin is indistinct and nonvisible. There is small (1-33%) pink granulation within the wound bed. There is a large (67-100%) amount of necrotic tissue within the wound bed including Adherent Slough. LIVIAH, CAKE ([id number]) Assessment Active Problems ICD-10 Non-pressure chronic ulcer of left calf limited to breakdown of skin Chronic venous hypertension (idiopathic) with inflammation of right lower extremity Lymphedema, not elsewhere classified Procedures Wound #5 Pre-procedure diagnosis of Wound #5 is a Lymphedema located on the Left,Medial Lower Leg . There was a Excisional Skin/Subcutaneous Tissue Debridement with a total area of 6.75 sq cm performed by [name] Dillon, MD. With the following instrument(s): Curette to remove Viable and Non-Viable tissue/material. Material removed includes Subcutaneous Tissue and Slough and after achieving pain control using Lidocaine. No specimens were  taken. A time out was conducted at 13:14, prior to the start of the procedure. A Minimum amount of bleeding was controlled with Pressure. The procedure was tolerated well. Post Debridement Measurements: 2.7cm length x 2.5cm width x 0.4cm depth; 2.121cm^3 volume. Character of Wound/Ulcer Post Debridement is stable. Post procedure Diagnosis Wound #5: Same as Pre-Procedure Wound #6 Pre-procedure diagnosis of Wound #6 is a Venous Leg Ulcer located on the Left,Lateral Lower Leg .Severity of Tissue Pre Debridement is: Fat layer exposed. There was a Excisional Skin/Subcutaneous Tissue Debridement with a total area of 13.76 sq cm performed by [name] Dillon, MD. With the following instrument(s): Curette to remove Viable and Non-Viable tissue/material. Material removed includes Subcutaneous Tissue and Slough and after achieving pain control using Lidocaine. No specimens were taken. A time out was conducted at 13:14, prior to the start of the procedure. A Minimum amount of bleeding was controlled with Pressure. The procedure was tolerated well. Post Debridement Measurements: 3.2cm length x 4.3cm width x 0.5cm depth; 5.404cm^3 volume. Character of Wound/Ulcer Post Debridement is stable. Severity of Tissue Post Debridement is: Fat layer exposed. Post procedure Diagnosis Wound #6: Same as Pre-Procedure Plan Wound Cleansing: Wound #5 Left,Medial Lower Leg: Clean wound with Normal Saline. May Shower, gently pat wound dry prior to applying new dressing. Wound #6 Left,Lateral Lower Leg: Clean wound with Normal Saline. May Shower, gently pat wound dry prior to applying new dressing. Anesthetic (add to Medication List): Wound #5 Left,Medial Lower Leg: Topical Lidocaine 4% cream applied to wound bed prior to debridement (In Clinic Only). Wound #6 Left,Lateral Lower Leg: IRAIDA, CRAGIN ([id number]) Topical Lidocaine 4% cream applied to wound bed prior to debridement (In Clinic Only). Skin  Barriers/Peri-Wound Care: Wound #5 Left,Medial Lower Leg: Other: - Antibiotic ointment on irritated areas Wound #6 Left,Lateral Lower Leg: Other: - Antibiotic ointment on irritated areas Primary Wound Dressing: Wound #5 Left,Medial Lower Leg: Other: - Sorbact Wound #6 Left,Lateral Lower Leg: Other: - Sorbact Secondary Dressing: Wound #5 Left,Medial Lower Leg: ABD pad Wound #6 Left,Lateral Lower Leg: ABD pad Dressing Change Frequency: Wound #5 Left,Medial Lower Leg: Change dressing every week Wound #6 Left,Lateral Lower Leg: Change dressing every week Follow-up Appointments: Wound #5 Left,Medial Lower Leg: Return Appointment in 1 week. Nurse Visit as needed Wound #6 Left,Lateral Lower Leg: Return Appointment in 1 week. Nurse Visit as needed Edema Control: Wound #5 Left,Medial Lower Leg: 3 Layer Compression System - Left Lower Extremity Wound #6 Left,Lateral Lower Leg: 3 Layer Compression System -  Left Lower Extremity Off-Loading: Wound #5 Left,Medial Lower Leg: Other: - Elevate legs as needed Wound #6 Left,Lateral Lower Leg: Other: - Elevate legs as needed Medications-please add to medication list.: Wound #5 Left,Medial Lower Leg: P.O. Antibiotics - complete antibiotics Wound #6 Left,Lateral Lower Leg: P.O. Antibiotics - complete antibiotics General Notes: VOB for Apligraf #1 I am continuing with Sorbact 2. She will finish the Augmentin 3. I put Apligraf through her insurance however not ready for this yet 4. We will attempt to get her in with vein and vascular in [place]. The patient is to call or to call us if she is not able to get through TARIKA, MCKETHAN ([id number]) Electronic Signature(s) Signed: 04/22/2019 6:03:55 PM By: Linton Ham MD Entered By: Linton Ham on 04/22/2019 13:59:43 Mcsweeney, [name] ([id number]) -------------------------------------------------------------------------------- SuperBill Details Patient Name: SERAPHINA, MITCHNER. Date of  Service: 04/22/2019 Medical Record Number: [id number] Patient Account Number: [id number] Date of Birth/Sex: 15-May-1946 (73 y.o. F) Treating RN: Cornell Barman Primary Care Provider: [name] Other Clinician: Referring Provider: [name] Treating Provider/Extender: Tito Dine in Treatment: 19 Diagnosis Coding ICD-10 Codes Code Description [phone] Non-pressure chronic ulcer of left calf limited to breakdown of skin I87.321 Chronic venous hypertension (idiopathic) with inflammation of right lower extremity I89.0 Lymphedema, not elsewhere classified Facility Procedures CPT4 Code: [id number] Description: 01655 - DEB SUBQ TISSUE 20 SQ CM/< ICD-10 Diagnosis Description L97.221 Non-pressure chronic ulcer of left calf limited to breakdown Modifier: of skin Quantity: 1 CPT4 Code: [id number] Description: 86754 - DEB SUBQ TISS EA ADDL 20CM ICD-10 Diagnosis Description L97.221 Non-pressure chronic ulcer of left calf limited to breakdown Modifier: of skin Quantity: 1 Physician Procedures CPT4 Code: [id number] Description: 71219 - WC PHYS SUBQ TISS 20 SQ CM ICD-10 Diagnosis Description L97.221 Non-pressure chronic ulcer of left calf limited to breakdown Modifier: of skin Quantity: 1 CPT4 Code: [id number] Description: 11045 - WC PHYS SUBQ TISS EA ADDL 20 CM ICD-10 Diagnosis Description L97.221 Non-pressure chronic ulcer of left calf limited to breakdown Modifier: of skin Quantity: 1 Electronic Signature(s) Signed: 04/22/2019 6:03:55 PM By: Linton Ham MD Entered By: Linton Ham on 04/22/2019 14:00:06

## 2019-04-27 ENCOUNTER — Ambulatory Visit: Payer: 59 | Admitting: Podiatry

## 2019-04-29 ENCOUNTER — Other Ambulatory Visit: Payer: Self-pay

## 2019-04-29 ENCOUNTER — Encounter: Payer: Medicare Other | Admitting: Internal Medicine

## 2019-04-29 DIAGNOSIS — L97222 Non-pressure chronic ulcer of left calf with fat layer exposed: Secondary | ICD-10-CM | POA: Diagnosis not present

## 2019-04-29 DIAGNOSIS — L97221 Non-pressure chronic ulcer of left calf limited to breakdown of skin: Secondary | ICD-10-CM | POA: Diagnosis not present

## 2019-04-29 DIAGNOSIS — I87332 Chronic venous hypertension (idiopathic) with ulcer and inflammation of left lower extremity: Secondary | ICD-10-CM | POA: Diagnosis not present

## 2019-04-29 DIAGNOSIS — L97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d: Secondary | ICD-10-CM | POA: Diagnosis not present

## 2019-04-29 DIAGNOSIS — I87312 Chronic venous hypertension (idiopathic) with ulcer of left lower extremity: Secondary | ICD-10-CM | POA: Diagnosis not present

## 2019-04-29 DIAGNOSIS — I890 Lymphedema, not elsewhere classified: Secondary | ICD-10-CM | POA: Diagnosis not present

## 2019-05-02 NOTE — Progress Notes (Signed)
VERNICE, MANNINA ([id number]) Visit Report for 04/29/2019 Debridement Details Patient Name: [name], [name]. Date of Service: 04/29/2019 12:45 PM Medical Record Number: [id number] Patient Account Number: [id number] Date of Birth/Sex: 22-Dec-1945 (73 y.o. F) Treating RN: Cornell Barman Primary Care Provider: [name] Other Clinician: Referring Provider: [name] Treating Provider/Extender: Tito Dine in Treatment: 20 Debridement Performed for Wound #5 Left,Medial Lower Leg Assessment: Performed By: Physician [name] Dillon, MD Debridement Type: Debridement Level of Consciousness (Pre- Awake and Alert procedure): Pre-procedure Verification/Time Yes - 13:02 Out Taken: Start Time: 13:02 Pain Control: Lidocaine Total Area Debrided (L x W): 2.7 (cm) x 2.4 (cm) = 6.48 (cm) Tissue and other material Slough, Subcutaneous, Slough debrided: Level: Skin/Subcutaneous Tissue Debridement Description: Excisional Instrument: Curette Bleeding: Moderate Hemostasis Achieved: Pressure End Time: 13:05 Response to Treatment: Procedure was tolerated well Level of Consciousness Awake and Alert (Post-procedure): Post Debridement Measurements of Total Wound Length: (cm) 2.7 Width: (cm) 2.4 Depth: (cm) 0.2 Volume: (cm) 1.018 Character of Wound/Ulcer Post Debridement: Stable Post Procedure Diagnosis Same as Pre-procedure Electronic Signature(s) Signed: 04/29/2019 4:13:17 PM By: Linton Ham MD Signed: 05/01/2019 4:38:11 PM By: [name], [name], RN, CWS, [name], [name] Entered By: Linton Ham on 04/29/2019 13:09:45 Lodico, [name] ([id number]) -------------------------------------------------------------------------------- Debridement Details Patient Name: [name], [name]. Date of Service: 04/29/2019 12:45 PM Medical Record Number: [id number] Patient Account Number: [id number] Date of Birth/Sex: 09/11/1946 (73 y.o. F) Treating RN: Cornell Barman Primary Care Provider:  [name] Other Clinician: Referring Provider: [name] Treating Provider/Extender: Tito Dine in Treatment: 20 Debridement Performed for Wound #6 Left,Lateral Lower Leg Assessment: Performed By: Physician [name] Dillon, MD Debridement Type: Debridement Severity of Tissue Pre Fat layer exposed Debridement: Level of Consciousness (Pre- Awake and Alert procedure): Pre-procedure Verification/Time Yes - 13:02 Out Taken: Start Time: 13:02 Pain Control: Lidocaine Total Area Debrided (L x W): 3.1 (cm) x 3.7 (cm) = 11.47 (cm) Tissue and other material Viable, Non-Viable, Slough, Subcutaneous, Slough debrided: Level: Skin/Subcutaneous Tissue Debridement Description: Excisional Instrument: Curette Bleeding: Moderate Hemostasis Achieved: Pressure End Time: 13:05 Response to Treatment: Procedure was tolerated well Level of Consciousness Awake and Alert (Post-procedure): Post Debridement Measurements of Total Wound Length: (cm) 3.1 Width: (cm) 3.7 Depth: (cm) 0.4 Volume: (cm) 3.603 Character of Wound/Ulcer Post Debridement: Stable Severity of Tissue Post Debridement: Fat layer exposed Post Procedure Diagnosis Same as Pre-procedure Electronic Signature(s) Signed: 04/29/2019 4:13:17 PM By: Linton Ham MD Signed: 05/01/2019 4:38:11 PM By: [name], [name], RN, CWS, [name], [name] Entered By: Linton Ham on 04/29/2019 13:10:00 SHIYA, FOGELMAN ([id number]) -------------------------------------------------------------------------------- HPI Details Patient Name: [name], [name]. Date of Service: 04/29/2019 12:45 PM Medical Record Number: [id number] Patient Account Number: [id number] Date of Birth/Sex: 01/22/1946 (73 y.o. F) Treating RN: Cornell Barman Primary Care Provider: [name] Other Clinician: Referring Provider: [name] Treating Provider/Extender: Tito Dine in Treatment: 20 History of Present Illness HPI  Description: Pleasant 73 year old with history of chronic venous insufficiency. No diabetes or peripheral vascular disease. Left ABI 1.29. Questionable history of left lower extremity DVT. She developed a recurrent ulceration on her left lateral calf in December 2015, which she attributes to poor diet and subsequent lower extremity edema. She underwent endovenous laser ablation of her left greater saphenous vein in 2010. She underwent laser ablation of accessory branch of left GSV in April 2016 by Dr. Kellie Simmering at [facility]. She was previously wearing Unna boots, which she tolerated well. Tolerating 2 layer compression and cadexomer iodine. She  returns to clinic for follow-up and is without new complaints. She denies any significant pain at this time. She reports persistent pain with pressure. No claudication or ischemic rest pain. No fever or chills. No drainage. READMISSION 11/13/16; this is a 73 year old woman who is not a diabetic. She is here for a review of a painful area on her left medial lower extremity. I note that she was seen here previously last year for wound I believe to be in the same area. At that time she had undergone previously a left greater saphenous vein ablation by Dr. Kellie Simmering and she had a ablation of the anterior accessory branch of the left greater saphenous vein in March 2016. Seeing that the wound actually closed over. In reviewing the history with her today the ulcer in this area has been recurrent. She describes a biopsy of this area in 2009 that only showed stasis physiology. 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 Do not see that she has ever had arterial studies in the left leg. She tells me she has a follow-up with Dr. Kellie Simmering in roughly 10 days In  any case she developed the reopening of this area roughly a month ago. On the background of this she describes rapidly increasing edema which has responded to Lasix 40 mg and metolazone 2.5 mg as well as the patient's lymph massage. She has been told she has both venous insufficiency and lymphedema but she cannot tolerate compression stockings 11/28/16; the patient saw Dr. Kellie Simmering recently. Per the patient he did arterial Dopplers in the office that did not show evidence of arterial insufficiency, per the patient he stated "treat this like an ordinary venous ulcer". She also saw her dermatologist Dr. Ronnald Ramp who felt that this was more of a vascular ulcer. In general things are improving although she arrives today with increasing bilateral lower extremity edema with weeping a deeper fluid through the wound on the left medial leg compatible with some degree of lymphedema 12/04/16; the patient's wound is fully epithelialized but I don't think fully healed. We will do another week of depression with Promogran and TCA however I suspect we'll be able to discharge her next week. This is a very unusual-looking wound which was initially a figure-of-eight type wound lying on its side surrounded by petechial like hemorrhage. She has had venous ablation on this side. She apparently does not have an arterial issue per Dr. Kellie Simmering. She saw her dermatologist thought it was "vascular". Patient is definitely going to need ongoing compression and I talked about this with her today she will go to elastic therapy after she leaves here next week 12/11/16; the patient's wound is not completely closed today. She has surrounding scar tissue and in further discussion with the patient it would appear that she had ulcers in this area in 2009 for a prolonged period of time ultimately requiring a punch biopsy of this area that only showed venous insufficiency. I did not previously pickup on this part of the history from  the patient. 12/18/16; the patient's wound is completely epithelialized. There is no open area here. She has significant bilateral venous insufficiency with secondary lymphedema to a mild-to-moderate degree she does not have compression stockings.. She did not say anything to me when I was in  the room, she told our intake nurse that she was still having pain in this area. This isn't unusual recurrent small open area. She is going to go to elastic therapy to obtain compression stockings. 12/25/16; the patient's wound is fully epithelialized. There is no open area here. The patient describes some continued episodic discomfort in this area medial left calf. However everything looks fine and healed here. She is been to elastic therapy and caught herself 15-20 mmHg stockings, they apparently were having trouble getting 20-30 mm stockings in her size ZEPHANIAH, ENYEART ([id number]) 01/22/17; this is a patient we discharged from the clinic a month ago. She has a recurrent open wound on her medial left calf. She had 15 mm support stockings. I told her I thought she needed 20-30 mm compression stockings. She tells me that she has been ill with hospitalization secondary to asthma and is been found to have severe hypokalemia likely secondary to a combination of Lasix and metolazone. This morning she noted blistering and leaking fluid on the posterior part of her left leg. She called our intake nurse urgently and we was saw her this afternoon. She has not had any real discomfort here. I don't know that she's been wearing any stockings on this leg for at least 2-3 days. ABIs in this clinic were 1.21 on the right and 1.3 on the left. She is previously seen vascular surgery who does not think that there is a peripheral arterial issue. 01/30/17; Patient arrives with no open wound on the left leg. She has been to elastic therapy and obtained 20-56mmhg below knee stockings and she has one on the right leg  today. READMISSION 02/19/18; this Goetzinger is a now 73 year old patient we've had in this clinic perhaps 3 times before. I had last looked at her from January 07 December 2016 with an area on the medial left leg. We discharged her on 12/25/16 however she had to be readmitted on 01/22/17 with a recurrence. I have in my notes that we discharged her on 20-30 mm stockings although she tells me she was only wearing support hose because she cannot get stockings on predominantly related to her cervical spine surgery/issues. She has had previous ablations done by vein and vascular in [place] including a great saphenous vein ablation on the left with an anterior accessory branch ablation I think both of these were in 2016. On one of the previous visit she had a biopsy noted 2009 that was negative. She is not felt to have an arterial issue. She is not a diabetic. She does have a history of obstructive sleep apnea hypertension asthma as well as chronic venous insufficiency and lymphedema. On this occasion she noted 2 dry scaly patch on her left leg. She tried to put lotion on this it didn't really help. There were 2 open areas.the patient has been seeing her primary physician from 02/05/18 through 02/14/18. She had Unna boots applied. The superior wound now on the lateral left leg has closed but she's had one wound that remains open on the lateral left leg. This is not the same spot as we dealt with in 2018. ABIs in this clinic were 1.3 bilaterally 02/26/18; patient has a small wound on the left lateral calf. Dimensions are down. She has chronic venous insufficiency and lymphedema. 03/05/18; small open area on the left lateral calf. Dimensions are down. Tightly adherent necrotic debris over the surface of the wound which was difficult to remove. Also the dressing [over collagen] stuck to  the wound surface. This was removed with some difficulty as well. Change the primary dressing to Hydrofera Blue ready 03/12/18;  small open area on the left lateral calf. Comes in with tightly adherent surface eschar as well as some adherent Hydrofera Blue. 03/19/18; open area on the left lateral calf. Again adherent surface eschar as well as some adherent Hydrofera Blue nonviable subcutaneous tissue. She complained of pain all week even with the reduction from 4-3 layer compression I put on last week. Also she had an increase in her ankle and calf measurements probably related to the same thing. 03/26/18; open area on the left lateral calf. A very small open area remains here. We used silver alginate starting last week as the Hydrofera Blue seem to stick to the wound bed. In using 4-layer compression 04/02/18; the open area in the left lateral calf at some adherent slough which I removed there is no open area here. We are able to transition her into her own compression stocking. Truthfully I think this is probably his support hose. However this does not maintain skin integrity will be limited. She cannot put over the toe compression stockings on because of neck problems hand problems etc. She is allergic to the lining layer of juxta lites. We might be forced to use extremitease stocking should this fail READMIT 11/24/2018 Patient is now a 73 year old woman who is not a diabetic. She has been in this clinic on at least 3 previous occasions largely with recurrent wounds on her left leg secondary to chronic venous insufficiency with secondary lymphedema. Her situation is complicated by inability to get stockings on and an allergy to neoprene which is apparently a component and at least juxta lites and other stockings. As a result she really has not been wearing any stockings on her legs. She tells [place] that roughly 2 or 3 weeks ago she started noticing a stinging sensation just above her ankle on the left medial aspect. She has been diagnosed with pseudogout and she wondered whether this was what she was experiencing. She tried to  dress this with something she bought at the store however subsequently it pulled skin off and now she has an open wound that is not improving. She has been using Vaseline gauze with a cover bandage. She saw her primary doctor last week who put an Haematologist on her. ABIs in this clinic was 1.03 on the left [name], SPEGAL. ([id number]) 2/12; the area is on the left medial ankle. Odd-looking wound with what looks to be surface epithelialization but a multitude of small petechial openings. This clearly not closed yet. We have been using silver alginate under 3 layer compression with TCA 2/19; the wound area did not look quite as good this week. Necrotic debris over the majority of the wound surface which required debridement. She continues to have a multitude of what looked to be small petechial openings. She reminds [place] that she had a biopsy on this initially during her first outbreak in 2015 in [place] [facility]. She expresses concern about this being a possible melanoma. 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 I assured her that this does not look anything like melanoma. Except for the petechial reaction it does look like a venous insufficiency area and she certainly has evidence of this on both sides 2/26; a difficult area on the left medial ankle. The patient clearly has chronic venous hypertension with some  degree of lymphedema. The odd thing about the area is the small petechial hemorrhages. I am not really sure how to explain this. This was present last time and this is not a compression injury. We have been using Hydrofera Blue which I changed to last week 3/4; still using Hydrofera Blue. Aggressive debridement today. She does not have known arterial issues. She has seen Dr. Kellie Simmering at [facility] vein and vascular and and has an ablation on the left. [Anterior accessory branch of the  greater saphenous]. From what I remember they did not feel she had an arterial issue. The patient has had this area biopsied in 2009 at [facility] dermatology and by her recollection they said this was "stasis". She is also follow-up with dermatology locally who thought that this was more of a vascular issue 3/11; using Hydrofera Blue. Aggressive debridement today. She does not have an arterial issue. We are using 3 layer compression although we may need to go to 4. The patient has been in for multiple changes to her wrap since I last saw her a week ago. She says that the area was leaking. I do not have too much more information on what was found 01/19/19 on evaluation today patient was actually being seen for a nurse visit when unfortunately she had the area on her left lateral lower extremity as well as weeping from the right lower extremity that became apparent. Therefore we did end up actually seeing her for a full visit with myself. She is having some pain at this site as well but fortunately nothing too significant at this point. No fevers, chills, nausea, or vomiting noted at this time. 3/18-Patient is back to the clinic with the left leg venous leg ulcer, the ulcer is larger in size, has a surface that is densely adherent with fibrinous tissue, the Hydrofera Blue was used but is densely adherent and there was difficulty in removing it. The right lower extremity was also wrapped for weeping edema. Patient has a new area over the left lateral foot above the malleolus that is small and appears to have no debris with intact surrounding skin. Patient is on increased dose of Lasix also as a means to edema management 3/25; the patient has a nonhealing venous ulcer on the medial left leg and last week developed a smaller area on the lateral left calf. We have been using Hydrofera Blue with a contact layer. 4/1; no major change in these wounds areas. Left medial and more recently left lateral calf. I  tried Iodoflex last week to aid in debridement she did not tolerate this. She stated her pain was terrible all week. She took the top layer of the 4 layer compression off. 4/8; the patient actually looks somewhat better in terms of her more prominent left lateral calf wound. There is some healthy looking tissue here. She is still complaining of a lot of discomfort. 4/15; patient in a lot of pain secondary to sciatica. She is on a prednisone taper prescribed by her primary physician. She has the 2 areas one on the left medial and more recently a smaller area on the left lateral calf. Both of these just above the malleoli 4/22; her back pain is better but she still states she is very uncomfortable and now feels she is intolerant to the [employer]. No real change in the wounds we have been using Sorbact. She has been previously intolerant to Iodoflex. There is not a lot of option about what we can use  to debride this wound under compression that she no doubt needs. sHe states Ultram no longer works for her pain 4/29; no major change in the wounds slightly increased depth. Surface on the original medial wound perhaps somewhat improved however the more recent area on the lateral left ankle is 100% covered in very adherent debris we have been using Sorbact. She tolerates 4 layer compression well and her edema control is a lot better. She has not had to come in for a nurse check 5/6; no major change in the condition of the wounds. She did consent to debridement today which was done with some difficulty. Continuing Sorbact. She did not tolerate Iodoflex. She was in for a check of her compression the day after we wrapped her last week this was adjusted but nothing much was found 5/13; no major change in the condition or area of the wounds. I was able to get a fairly aggressive debridement done on the lateral left leg wound. Even using Sorbact under compression. She came back in on Friday to have the wrap  changed. She says she felt uncomfortable on the lateral aspect of her ankle. She has a long history of chronic venous insufficiency including previous ablation surgery on this side. 5/20-Patient returns for wounds on left leg with both wounds covered in slough, with the lateral leg wound larger in size, she has been in 3 layer compression and felt more comfortable, she describes pain in ankle, in leg and pins and needles in foot, and is about to try Pamelor for this 6/3; wounds on the left lateral and left medial leg. The area medially which is the most recent of the 2 seems to have had the largest increase in dimensions. We have been using Sorbac to try and debride the surface. She has been to see orthopedics they apparently did a plain x-ray that was indeterminant. Diagnosed her with neuropathy and they have ordered an MRI to VAN, [name]. ([id number]) determine if there is underlying osteomyelitis. This was not high on my thought list but I suppose it is prudent. We have advised her to make an appointment with vein and vascular in [place]. She has a history of a left greater saphenous and accessory vein ablations I wonder if there is anything else that can be done from a surgical point of view to help in these difficult refractory wounds. We have previously healed this wound on one occasion but it keeps on reopening [medial side] 6/10; deep tissue culture I did last week I think on the left medial wound showed both moderate E. coli and moderate staph aureus [MSSA]. She is going to require antibiotics and I have chosen Augmentin. We have been using Sorbact and we have made better looking wound surface on both sides but certainly no improvement in wound area. She was back in last Friday apparently for a dressing changes the wrap was hurting her outer left ankle. She has not managed to get a hold of vein and vascular in [place]. We are going to have to make her that appointment 6/17;  patient is tolerating the Augmentin. She had an MRI that I think was ordered by orthopedic surgeon this did not show osteomyelitis or an abscess did suggest cellulitis. We have been using Sorbact to the lateral and medial ankles. We have been trying to arrange a follow-up appointment with vein and vascular in [place] or did her original ablations. We apparently an area sent the request to vein and vascular in [facility]  6/24; patient has completed the Augmentin. We do not yet have a vein and vascular appointment in [place]. I am not sure what the issue is here we have asked her to call tomorrow. We are using Sorbact. Making some improvements and especially the medial wound. Both surfaces however look better medial and lateral. Electronic Signature(s) Signed: 04/29/2019 4:13:17 PM By: Linton Ham MD Entered By: Linton Ham on 04/29/2019 13:12:56 Roser, [name] ([id number]) -------------------------------------------------------------------------------- Physical Exam Details Patient Name: [name], [name]. Date of Service: 04/29/2019 12:45 PM Medical Record Number: [id number] Patient Account Number: [id number] Date of Birth/Sex: October 21, 1946 (73 y.o. F) Treating RN: Cornell Barman Primary Care Provider: [name] Other Clinician: Referring Provider: [name] Treating Provider/Extender: Tito Dine in Treatment: 20 Constitutional Sitting or standing Blood Pressure is within target range for patient.. Pulse regular and within target range for patient.Marland Kitchen Respirations regular, non-labored and within target range.. Temperature is normal and within the target range for the patient.Marland Kitchen appears in no distress. Notes Wound exam oThe original wound on the medial leg continues to look better and contracting. Still a large amount of adherent surface necrotic material however. Same condition for the larger wound on the lateral lower leg. Both wounds debrided with an  open curette. She tolerates this marginally. Electronic Signature(s) Signed: 04/29/2019 4:13:17 PM By: Linton Ham MD Entered By: Linton Ham on 04/29/2019 13:13:58 Fitterer, [name] ([id number]) -------------------------------------------------------------------------------- Physician Orders Details Patient Name: COLENE, MINES. Date of Service: 04/29/2019 12:45 PM Medical Record Number: [id number] Patient Account Number: [id number] Date of Birth/Sex: April 07, 1946 (73 y.o. F) Treating RN: Cornell Barman Primary Care Provider: [name] Other Clinician: Referring Provider: [name] Treating Provider/Extender: Tito Dine in Treatment: 20 Verbal / Phone Orders: No Diagnosis Coding Wound Cleansing Wound #5 Left,Medial Lower Leg o Clean wound with Normal Saline. o May Shower, gently pat wound dry prior to applying new dressing. Wound #6 Left,Lateral Lower Leg o Clean wound with Normal Saline. o May Shower, gently pat wound dry prior to applying new dressing. Anesthetic (add to Medication List) Wound #5 Left,Medial Lower Leg o Topical Lidocaine 4% cream applied to wound bed prior to debridement (In Clinic Only). o Benzocaine Topical Anesthetic Spray applied to wound bed prior to debridement (In Clinic Only). Wound #6 Left,Lateral Lower Leg o Topical Lidocaine 4% cream applied to wound bed prior to debridement (In Clinic Only). o Benzocaine Topical Anesthetic Spray applied to wound bed prior to debridement (In Clinic Only). Skin Barriers/Peri-Wound Care Wound #5 Left,Medial Lower Leg o Other: - Antibiotic ointment on irritated areas Wound #6 Left,Lateral Lower Leg o Other: - Antibiotic ointment on irritated areas Primary Wound Dressing Wound #5 Left,Medial Lower Leg o Other: - Sorbact Wound #6 Left,Lateral Lower Leg o Other: - Sorbact Secondary Dressing Wound #5 Left,Medial Lower Leg o ABD pad Wound #6 Left,Lateral Lower  Leg o ABD pad Dressing Change Frequency Wound #5 Left,Medial Lower Leg o Change dressing every week DEMEKA, SUTTER ([id number]) Wound #6 Left,Lateral Lower Leg o Change dressing every week Follow-up Appointments Wound #5 Left,Medial Lower Leg o Return Appointment in 1 week. o Nurse Visit as needed Wound #6 Left,Lateral Lower Leg o Return Appointment in 1 week. o Nurse Visit as needed Edema Control Wound #5 Left,Medial Lower Leg o 3 Layer Compression System - Left Lower Extremity Wound #6 Left,Lateral Lower Leg o 3 Layer Compression System - Left Lower Extremity Off-Loading Wound #5 Left,Medial Lower Leg o Other: - Elevate legs as needed Wound #6  Left,Lateral Lower Leg o Other: - Elevate legs as needed Electronic Signature(s) Signed: 04/29/2019 4:13:17 PM By: Linton Ham MD Signed: 05/01/2019 4:38:11 PM By: [name], [name], RN, CWS, [name], [name] Entered By: [name], [name], RN, CWS, Kim on 04/29/2019 13:04:54 NYLAH, BUTKUS ([id number]) -------------------------------------------------------------------------------- Problem List Details Patient Name: RASHANNA, CHRISTIANA. Date of Service: 04/29/2019 12:45 PM Medical Record Number: [id number] Patient Account Number: [id number] Date of Birth/Sex: December 26, 1945 (73 y.o. F) Treating RN: Cornell Barman Primary Care Provider: [name] Other Clinician: Referring Provider: [name] Treating Provider/Extender: Tito Dine in Treatment: 20 Active Problems ICD-10 Evaluated Encounter Code Description Active Date Today Diagnosis L97.221 Non-pressure chronic ulcer of left calf limited to breakdown of 01/07/2019 No Yes skin I87.321 Chronic venous hypertension (idiopathic) with inflammation of 12/10/2018 No Yes right lower extremity I89.0 Lymphedema, not elsewhere classified 12/10/2018 No Yes Inactive Problems Resolved Problems Electronic Signature(s) Signed: 04/29/2019 4:13:17 PM By: Linton Ham  MD Entered By: Linton Ham on 04/29/2019 13:09:10 Spindler, [name] ([id number]) -------------------------------------------------------------------------------- Progress Note Details Patient Name: [name], [name]. Date of Service: 04/29/2019 12:45 PM Medical Record Number: [id number] Patient Account Number: [id number] Date of Birth/Sex: 1946/08/29 (73 y.o. F) Treating RN: Cornell Barman Primary Care Provider: [name] Other Clinician: Referring Provider: [name] Treating Provider/Extender: Tito Dine in Treatment: 20 Subjective History of Present Illness (HPI) Pleasant 73 year old with history of chronic venous insufficiency. No diabetes or peripheral vascular disease. Left ABI 1.29. Questionable history of left lower extremity DVT. She developed a recurrent ulceration on her left lateral calf in December 2015, which she attributes to poor diet and subsequent lower extremity edema. She underwent endovenous laser ablation of her left greater saphenous vein in 2010. She underwent laser ablation of accessory branch of left GSV in April 2016 by Dr. Kellie Simmering at [facility]. She was previously wearing Unna boots, which she tolerated well. Tolerating 2 layer compression and cadexomer iodine. She returns to clinic for follow-up and is without new complaints. She denies any significant pain at this time. She reports persistent pain with pressure. No claudication or ischemic rest pain. No fever or chills. No drainage. READMISSION 11/13/16; this is a 73 year old woman who is not a diabetic. She is here for a review of a painful area on her left medial lower extremity. I note that she was seen here previously last year for wound I believe to be in the same area. At that time she had undergone previously a left greater saphenous vein ablation by Dr. Kellie Simmering and she had a ablation of the anterior accessory branch of the left greater saphenous vein in March 2016. Seeing that the  wound actually closed over. In reviewing the history with her today the ulcer in this area has been recurrent. She describes a biopsy of this area in 2009 that only showed stasis physiology. 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 Do not see that she has ever had arterial studies in the left leg. She tells me she has a follow-up with Dr. Kellie Simmering in roughly 10 days In any case she developed the reopening of this area roughly a month ago. On the background of this she describes rapidly increasing edema which has responded to Lasix 40 mg and metolazone 2.5 mg as well  as the patient's lymph massage. She has been told she has both venous insufficiency and lymphedema but she cannot tolerate compression stockings 11/28/16; the patient saw Dr. Kellie Simmering recently. Per the patient he did arterial Dopplers in the office that did not show evidence of arterial insufficiency, per the patient he stated "treat this like an ordinary venous ulcer". She also saw her dermatologist Dr. Ronnald Ramp who felt that this was more of a vascular ulcer. In general things are improving although she arrives today with increasing bilateral lower extremity edema with weeping a deeper fluid through the wound on the left medial leg compatible with some degree of lymphedema 12/04/16; the patient's wound is fully epithelialized but I don't think fully healed. We will do another week of depression with Promogran and TCA however I suspect we'll be able to discharge her next week. This is a very unusual-looking wound which was initially a figure-of-eight type wound lying on its side surrounded by petechial like hemorrhage. She has had venous ablation on this side. She apparently does not have an arterial issue per Dr. Kellie Simmering. She  saw her dermatologist thought it was "vascular". Patient is definitely going to need ongoing compression and I talked about this with her today she will go to elastic therapy after she leaves here next week 12/11/16; the patient's wound is not completely closed today. She has surrounding scar tissue and in further discussion with the patient it would appear that she had ulcers in this area in 2009 for a prolonged period of time ultimately requiring a punch biopsy of this area that only showed venous insufficiency. I did not previously pickup on this part of the history from the patient. 12/18/16; the patient's wound is completely epithelialized. There is no open area here. She has significant bilateral venous insufficiency with secondary lymphedema to a mild-to-moderate degree she does not have compression stockings.. She did not say anything to me when I was in the room, she told our intake nurse that she was still having pain in this area. This isn't unusual recurrent small open area. She is going to go to elastic therapy to obtain compression stockings. 12/25/16; the patient's wound is fully epithelialized. There is no open area here. The patient describes some continued episodic discomfort in this area medial left calf. However everything looks fine and healed here. She is been to elastic therapy and GIULIANNA, ROCHA ([id number]) caught herself 15-20 mmHg stockings, they apparently were having trouble getting 20-30 mm stockings in her size 01/22/17; this is a patient we discharged from the clinic a month ago. She has a recurrent open wound on her medial left calf. She had 15 mm support stockings. I told her I thought she needed 20-30 mm compression stockings. She tells me that she has been ill with hospitalization secondary to asthma and is been found to have severe hypokalemia likely secondary to a combination of Lasix and metolazone. This morning she noted blistering and leaking fluid on the  posterior part of her left leg. She called our intake nurse urgently and we was saw her this afternoon. She has not had any real discomfort here. I don't know that she's been wearing any stockings on this leg for at least 2-3 days. ABIs in this clinic were 1.21 on the right and 1.3 on the left. She is previously seen vascular surgery who does not think that there is a peripheral arterial issue. 01/30/17; Patient arrives with no open wound on the left leg. She has been to  elastic therapy and obtained 20-30mmhg below knee stockings and she has one on the right leg today. READMISSION 02/19/18; this Suder is a now 73 year old patient we've had in this clinic perhaps 3 times before. I had last looked at her from January 07 December 2016 with an area on the medial left leg. We discharged her on 12/25/16 however she had to be readmitted on 01/22/17 with a recurrence. I have in my notes that we discharged her on 20-30 mm stockings although she tells me she was only wearing support hose because she cannot get stockings on predominantly related to her cervical spine surgery/issues. She has had previous ablations done by vein and vascular in [place] including a great saphenous vein ablation on the left with an anterior accessory branch ablation I think both of these were in 2016. On one of the previous visit she had a biopsy noted 2009 that was negative. She is not felt to have an arterial issue. She is not a diabetic. She does have a history of obstructive sleep apnea hypertension asthma as well as chronic venous insufficiency and lymphedema. On this occasion she noted 2 dry scaly patch on her left leg. She tried to put lotion on this it didn't really help. There were 2 open areas.the patient has been seeing her primary physician from 02/05/18 through 02/14/18. She had Unna boots applied. The superior wound now on the lateral left leg has closed but she's had one wound that remains open on the lateral left leg.  This is not the same spot as we dealt with in 2018. ABIs in this clinic were 1.3 bilaterally 02/26/18; patient has a small wound on the left lateral calf. Dimensions are down. She has chronic venous insufficiency and lymphedema. 03/05/18; small open area on the left lateral calf. Dimensions are down. Tightly adherent necrotic debris over the surface of the wound which was difficult to remove. Also the dressing [over collagen] stuck to the wound surface. This was removed with some difficulty as well. Change the primary dressing to Hydrofera Blue ready 03/12/18; small open area on the left lateral calf. Comes in with tightly adherent surface eschar as well as some adherent Hydrofera Blue. 03/19/18; open area on the left lateral calf. Again adherent surface eschar as well as some adherent Hydrofera Blue nonviable subcutaneous tissue. She complained of pain all week even with the reduction from 4-3 layer compression I put on last week. Also she had an increase in her ankle and calf measurements probably related to the same thing. 03/26/18; open area on the left lateral calf. A very small open area remains here. We used silver alginate starting last week as the Hydrofera Blue seem to stick to the wound bed. In using 4-layer compression 04/02/18; the open area in the left lateral calf at some adherent slough which I removed there is no open area here. We are able to transition her into her own compression stocking. Truthfully I think this is probably his support hose. However this does not maintain skin integrity will be limited. She cannot put over the toe compression stockings on because of neck problems hand problems etc. She is allergic to the lining layer of juxta lites. We might be forced to use extremitease stocking should this fail READMIT 11/24/2018 Patient is now a 73 year old woman who is not a diabetic. She has been in this clinic on at least 3 previous occasions largely with recurrent wounds on  her left leg secondary to chronic venous insufficiency with secondary  lymphedema. Her situation is complicated by inability to get stockings on and an allergy to neoprene which is apparently a component and at least juxta lites and other stockings. As a result she really has not been wearing any stockings on her legs. She tells [place] that roughly 2 or 3 weeks ago she started noticing a stinging sensation just above her ankle on the left medial aspect. She has been diagnosed with pseudogout and she wondered whether this was what she was experiencing. She tried to dress this with something she bought at the store however subsequently it pulled skin off and now she has an open wound that is not improving. She has been using Vaseline gauze with a cover bandage. She saw her primary doctor last week who put an Haematologist on her. ABIs in this clinic was 1.03 on the left [name], [name]. ([id number]) 2/12; the area is on the left medial ankle. Odd-looking wound with what looks to be surface epithelialization but a multitude of small petechial openings. This clearly not closed yet. We have been using silver alginate under 3 layer compression with TCA 2/19; the wound area did not look quite as good this week. Necrotic debris over the majority of the wound surface which required debridement. She continues to have a multitude of what looked to be small petechial openings. She reminds [place] that she had a biopsy on this initially during her first outbreak in 2015 in [place] [facility]. She expresses concern about this being a possible melanoma. 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 I assured her that this does not look anything like melanoma. Except for the petechial reaction it does look like a venous insufficiency area and she certainly has evidence of this on both sides 2/26; a difficult area on the left medial ankle.  The patient clearly has chronic venous hypertension with some degree of lymphedema. The odd thing about the area is the small petechial hemorrhages. I am not really sure how to explain this. This was present last time and this is not a compression injury. We have been using Hydrofera Blue which I changed to last week 3/4; still using Hydrofera Blue. Aggressive debridement today. She does not have known arterial issues. She has seen Dr. Kellie Simmering at [facility] vascular and and has an ablation on the left. [Anterior accessory branch of the greater saphenous]. From what I remember they did not feel she had an arterial issue. The patient has had this area biopsied in 2009 at [facility] dermatology and by her recollection they said this was "stasis". She is also follow-up with dermatology locally who thought that this was more of a vascular issue 3/11; using Hydrofera Blue. Aggressive debridement today. She does not have an arterial issue. We are using 3 layer compression although we may need to go to 4. The patient has been in for multiple changes to her wrap since I last saw her a week ago. She says that the area was leaking. I do not have too much more information on what was found 01/19/19 on evaluation today patient was actually being seen for a nurse visit when unfortunately she had the area on her left lateral lower extremity as well as weeping from the right lower extremity that became apparent. Therefore we did end up actually seeing her for a full visit with myself. She is having some pain at this site as well but fortunately  nothing too significant at this point. No fevers, chills, nausea, or vomiting noted at this time. 3/18-Patient is back to the clinic with the left leg venous leg ulcer, the ulcer is larger in size, has a surface that is densely adherent with fibrinous tissue, the Hydrofera Blue was used but is densely adherent and there was difficulty in removing it. The right lower  extremity was also wrapped for weeping edema. Patient has a new area over the left lateral foot above the malleolus that is small and appears to have no debris with intact surrounding skin. Patient is on increased dose of Lasix also as a means to edema management 3/25; the patient has a nonhealing venous ulcer on the medial left leg and last week developed a smaller area on the lateral left calf. We have been using Hydrofera Blue with a contact layer. 4/1; no major change in these wounds areas. Left medial and more recently left lateral calf. I tried Iodoflex last week to aid in debridement she did not tolerate this. She stated her pain was terrible all week. She took the top layer of the 4 layer compression off. 4/8; the patient actually looks somewhat better in terms of her more prominent left lateral calf wound. There is some healthy looking tissue here. She is still complaining of a lot of discomfort. 4/15; patient in a lot of pain secondary to sciatica. She is on a prednisone taper prescribed by her primary physician. She has the 2 areas one on the left medial and more recently a smaller area on the left lateral calf. Both of these just above the malleoli 4/22; her back pain is better but she still states she is very uncomfortable and now feels she is intolerant to the [employer]. No real change in the wounds we have been using Sorbact. She has been previously intolerant to Iodoflex. There is not a lot of option about what we can use to debride this wound under compression that she no doubt needs. sHe states Ultram no longer works for her pain 4/29; no major change in the wounds slightly increased depth. Surface on the original medial wound perhaps somewhat improved however the more recent area on the lateral left ankle is 100% covered in very adherent debris we have been using Sorbact. She tolerates 4 layer compression well and her edema control is a lot better. She has not had to come in for  a nurse check 5/6; no major change in the condition of the wounds. She did consent to debridement today which was done with some difficulty. Continuing Sorbact. She did not tolerate Iodoflex. She was in for a check of her compression the day after we wrapped her last week this was adjusted but nothing much was found 5/13; no major change in the condition or area of the wounds. I was able to get a fairly aggressive debridement done on the lateral left leg wound. Even using Sorbact under compression. She came back in on Friday to have the wrap changed. She says she felt uncomfortable on the lateral aspect of her ankle. She has a long history of chronic venous insufficiency including previous ablation surgery on this side. 5/20-Patient returns for wounds on left leg with both wounds covered in slough, with the lateral leg wound larger in size, she has been in 3 layer compression and felt more comfortable, she describes pain in ankle, in leg and pins and needles in foot, and is about to try Pamelor for this 6/3; wounds  on the left lateral and left medial leg. The area medially which is the most recent of the 2 seems to have had the largest increase in dimensions. We have been using Sorbac to try and debride the surface. She has been to see orthopedics OLUWATOBI, RUPPE ([id number]) they apparently did a plain x-ray that was indeterminant. Diagnosed her with neuropathy and they have ordered an MRI to determine if there is underlying osteomyelitis. This was not high on my thought list but I suppose it is prudent. We have advised her to make an appointment with vein and vascular in [place]. She has a history of a left greater saphenous and accessory vein ablations I wonder if there is anything else that can be done from a surgical point of view to help in these difficult refractory wounds. We have previously healed this wound on one occasion but it keeps on reopening [medial side] 6/10; deep tissue  culture I did last week I think on the left medial wound showed both moderate E. coli and moderate staph aureus [MSSA]. She is going to require antibiotics and I have chosen Augmentin. We have been using Sorbact and we have made better looking wound surface on both sides but certainly no improvement in wound area. She was back in last Friday apparently for a dressing changes the wrap was hurting her outer left ankle. She has not managed to get a hold of vein and vascular in [place]. We are going to have to make her that appointment 6/17; patient is tolerating the Augmentin. She had an MRI that I think was ordered by orthopedic surgeon this did not show osteomyelitis or an abscess did suggest cellulitis. We have been using Sorbact to the lateral and medial ankles. We have been trying to arrange a follow-up appointment with vein and vascular in [place] or did her original ablations. We apparently an area sent the request to vein and vascular in [facility] 6/24; patient has completed the Augmentin. We do not yet have a vein and vascular appointment in [place]. I am not sure what the issue is here we have asked her to call tomorrow. We are using Sorbact. Making some improvements and especially the medial wound. Both surfaces however look better medial and lateral. Objective Constitutional Sitting or standing Blood Pressure is within target range for patient.. Pulse regular and within target range for patient.Marland Kitchen Respirations regular, non-labored and within target range.. Temperature is normal and within the target range for the patient.Marland Kitchen appears in no distress. Vitals Time Taken: 12:42 PM, Height: 63 in, Weight: 224.7 lbs, BMI: 39.8, Temperature: 99.3 F, Pulse: 89 bpm, Respiratory Rate: 18 breaths/min, Blood Pressure: 142/65 mmHg. General Notes: Wound exam The original wound on the medial leg continues to look better and contracting. Still a large amount of adherent surface necrotic material  however. Same condition for the larger wound on the lateral lower leg. Both wounds debrided with an open curette. She tolerates this marginally. Integumentary (Hair, Skin) Wound #5 status is Open. Original cause of wound was Gradually Appeared. The wound is located on the Left,Medial Lower Leg. The wound measures 2.7cm length x 2.4cm width x 0.3cm depth; 5.089cm^2 area and 1.527cm^3 volume. There is Fat Layer (Subcutaneous Tissue) Exposed exposed. There is no tunneling or undermining noted. There is a medium amount of purulent drainage noted. The wound margin is indistinct and nonvisible. There is small (1-33%) pink granulation within the wound bed. There is a large (67-100%) amount of necrotic tissue within the wound bed  including Beatty. Wound #6 status is Open. Original cause of wound was Gradually Appeared. The wound is located on the Left,Lateral Lower Leg. The wound measures 3.1cm length x 3.7cm width x 0.4cm depth; 9.009cm^2 area and 3.603cm^3 volume. There is Fat Layer (Subcutaneous Tissue) Exposed exposed. There is no tunneling or undermining noted. There is a medium amount of purulent drainage noted. The wound margin is indistinct and nonvisible. There is small (1-33%) pink granulation within the wound bed. There is a large (67-100%) amount of necrotic tissue within the wound bed including Adherent Slough. NGOC, DETJEN ([id number]) Assessment Active Problems ICD-10 Non-pressure chronic ulcer of left calf limited to breakdown of skin Chronic venous hypertension (idiopathic) with inflammation of right lower extremity Lymphedema, not elsewhere classified Procedures Wound #5 Pre-procedure diagnosis of Wound #5 is a Lymphedema located on the Left,Medial Lower Leg . There was a Excisional Skin/Subcutaneous Tissue Debridement with a total area of 6.48 sq cm performed by [name] Dillon, MD. With the following instrument(s): Curette Material removed includes Subcutaneous  Tissue and Slough and after achieving pain control using Lidocaine. No specimens were taken. A time out was conducted at 13:02, prior to the start of the procedure. A Moderate amount of bleeding was controlled with Pressure. The procedure was tolerated well. Post Debridement Measurements: 2.7cm length x 2.4cm width x 0.2cm depth; 1.018cm^3 volume. Character of Wound/Ulcer Post Debridement is stable. Post procedure Diagnosis Wound #5: Same as Pre-Procedure Wound #6 Pre-procedure diagnosis of Wound #6 is a Venous Leg Ulcer located on the Left,Lateral Lower Leg .Severity of Tissue Pre Debridement is: Fat layer exposed. There was a Excisional Skin/Subcutaneous Tissue Debridement with a total area of 11.47 sq cm performed by [name] Dillon, MD. With the following instrument(s): Curette to remove Viable and Non-Viable tissue/material. Material removed includes Subcutaneous Tissue and Slough and after achieving pain control using Lidocaine. No specimens were taken. A time out was conducted at 13:02, prior to the start of the procedure. A Moderate amount of bleeding was controlled with Pressure. The procedure was tolerated well. Post Debridement Measurements: 3.1cm length x 3.7cm width x 0.4cm depth; 3.603cm^3 volume. Character of Wound/Ulcer Post Debridement is stable. Severity of Tissue Post Debridement is: Fat layer exposed. Post procedure Diagnosis Wound #6: Same as Pre-Procedure Plan Wound Cleansing: Wound #5 Left,Medial Lower Leg: Clean wound with Normal Saline. May Shower, gently pat wound dry prior to applying new dressing. Wound #6 Left,Lateral Lower Leg: Clean wound with Normal Saline. May Shower, gently pat wound dry prior to applying new dressing. Anesthetic (add to Medication List): Wound #5 Left,Medial Lower Leg: Topical Lidocaine 4% cream applied to wound bed prior to debridement (In Clinic Only). Benzocaine Topical Anesthetic Spray applied to wound bed prior to debridement (In  Clinic Only). Wound #6 Left,Lateral Lower Leg: Topical Lidocaine 4% cream applied to wound bed prior to debridement (In Clinic Only). EMILENE, ROMA ([id number]) Benzocaine Topical Anesthetic Spray applied to wound bed prior to debridement (In Clinic Only). Skin Barriers/Peri-Wound Care: Wound #5 Left,Medial Lower Leg: Other: - Antibiotic ointment on irritated areas Wound #6 Left,Lateral Lower Leg: Other: - Antibiotic ointment on irritated areas Primary Wound Dressing: Wound #5 Left,Medial Lower Leg: Other: - Sorbact Wound #6 Left,Lateral Lower Leg: Other: - Sorbact Secondary Dressing: Wound #5 Left,Medial Lower Leg: ABD pad Wound #6 Left,Lateral Lower Leg: ABD pad Dressing Change Frequency: Wound #5 Left,Medial Lower Leg: Change dressing every week Wound #6 Left,Lateral Lower Leg: Change dressing every week Follow-up Appointments: Wound #5 Left,Medial  Lower Leg: Return Appointment in 1 week. Nurse Visit as needed Wound #6 Left,Lateral Lower Leg: Return Appointment in 1 week. Nurse Visit as needed Edema Control: Wound #5 Left,Medial Lower Leg: 3 Layer Compression System - Left Lower Extremity Wound #6 Left,Lateral Lower Leg: 3 Layer Compression System - Left Lower Extremity Off-Loading: Wound #5 Left,Medial Lower Leg: Other: - Elevate legs as needed Wound #6 Left,Lateral Lower Leg: Other: - Elevate legs as needed 1. Continue the Sorbact under the same compression 2. We have asked her to get appointment with vein and vascular in [facility] 3. I do not believe she requires any further antibiotics 4. She has been approved for Apligraf with no co-pay. I do not believe she is quite ready for this yet. Still too much surface problems Electronic Signature(s) Signed: 04/29/2019 4:13:17 PM By: Linton Ham MD Entered By: Linton Ham on 04/29/2019 13:15:04 Urwin, [name]  ([id number]) -------------------------------------------------------------------------------- SuperBill Details Patient Name: [name], [name]. Date of Service: 04/29/2019 Medical Record Number: [id number] Patient Account Number: [id number] Date of Birth/Sex: 1946-09-15 (73 y.o. F) Treating RN: Cornell Barman Primary Care Provider: [name] Other Clinician: Referring Provider: [name] Treating Provider/Extender: Tito Dine in Treatment: 20 Diagnosis Coding ICD-10 Codes Code Description [phone] Non-pressure chronic ulcer of left calf limited to breakdown of skin I87.321 Chronic venous hypertension (idiopathic) with inflammation of right lower extremity I89.0 Lymphedema, not elsewhere classified Facility Procedures CPT4 Code: 92330076 Description: 22633 - DEB SUBQ TISSUE 20 SQ CM/< ICD-10 Diagnosis Description L97.221 Non-pressure chronic ulcer of left calf limited to breakdown Modifier: of skin Quantity: 1 Physician Procedures CPT4 Code: 3545625 Description: 63893 - WC PHYS SUBQ TISS 20 SQ CM ICD-10 Diagnosis Description L97.221 Non-pressure chronic ulcer of left calf limited to breakdown Modifier: of skin Quantity: 1 Electronic Signature(s) Signed: 04/29/2019 4:13:17 PM By: Linton Ham MD Entered By: Linton Ham on 04/29/2019 13:15:32

## 2019-05-02 NOTE — Progress Notes (Signed)
TANGA, GLOOR ([id number]) Visit Report for 04/29/2019 Arrival Information Details Patient Name: [name], [name]. Date of Service: 04/29/2019 12:45 PM Medical Record Number: [id number] Patient Account Number: [id number] Date of Birth/Sex: October 27, 1946 (73 y.o. F) Treating RN: Montey Hora Primary Care Jaylie Neaves: [name] Other Clinician: Referring Avan Gullett: [name] Treating Ned Kakar/Extender: Tito Dine in Treatment: 83 Visit Information History Since Last Visit Added or deleted any medications: No Patient Arrived: Ambulatory Any new allergies or adverse reactions: No Arrival Time: 12:41 Had a fall or experienced change in No Accompanied By: self activities of daily living that may affect Transfer Assistance: None risk of falls: Patient Identification Verified: Yes Signs or symptoms of abuse/neglect since last visito No Secondary Verification Process Completed: Yes Hospitalized since last visit: No Implantable device outside of the clinic excluding No cellular tissue based products placed in the center since last visit: Has Dressing in Place as Prescribed: Yes Has Compression in Place as Prescribed: Yes Pain Present Now: Yes Electronic Signature(s) Signed: 04/30/2019 5:10:09 PM By: Montey Hora Entered By: Montey Hora on 04/29/2019 12:41:49 Drennan, [name] ([id number]) -------------------------------------------------------------------------------- Encounter Discharge Information Details Patient Name: [name]. Date of Service: 04/29/2019 12:45 PM Medical Record Number: [id number] Patient Account Number: [id number] Date of Birth/Sex: Feb 05, 1946 (73 y.o. F) Treating RN: Army Melia Primary Care Anglia Blakley: [name] Other Clinician: Referring Imanii Gosdin: [name] Treating Breeona Waid/Extender: Tito Dine in Treatment: 20 Encounter Discharge Information Items Post Procedure Vitals Discharge Condition:  Stable Temperature (F): 99.3 Ambulatory Status: Ambulatory Pulse (bpm): 85 Discharge Destination: Home Respiratory Rate (breaths/min): 16 Transportation: Private Auto Blood Pressure (mmHg): 142/65 Accompanied By: self Schedule Follow-up Appointment: Yes Clinical Summary of Care: Electronic Signature(s) Signed: 04/29/2019 1:24:53 PM By: Army Melia Entered By: Army Melia on 04/29/2019 13:24:53 Hey, [name] ([id number]) -------------------------------------------------------------------------------- Lower Extremity Assessment Details Patient Name: [name]. Date of Service: 04/29/2019 12:45 PM Medical Record Number: [id number] Patient Account Number: [id number] Date of Birth/Sex: 1946-04-28 (73 y.o. F) Treating RN: Montey Hora Primary Care Blain Hunsucker: [name] Other Clinician: Referring Sheldon Sem: [name] Treating Lucciana Head/Extender: Tito Dine in Treatment: 20 Edema Assessment Assessed: [Left: No] [Right: No] Edema: [Left: Ye] [Right: s] Calf Left: Right: Point of Measurement: 31 cm From Medial Instep 39.5 cm cm Ankle Left: Right: Point of Measurement: 13 cm From Medial Instep 23 cm cm Vascular Assessment Pulses: Dorsalis Pedis Palpable: [Left:Yes] Electronic Signature(s) Signed: 04/30/2019 5:10:09 PM By: Montey Hora Entered By: Montey Hora on 04/29/2019 12:47:51 Lirette, [name] ([id number]) -------------------------------------------------------------------------------- Multi Wound Chart Details Patient Name: [name]. Date of Service: 04/29/2019 12:45 PM Medical Record Number: [id number] Patient Account Number: [id number] Date of Birth/Sex: 10/15/1946 (73 y.o. F) Treating RN: Cornell Barman Primary Care Jaquari Reckner: [name] Other Clinician: Referring Moni Rothrock: [name] Treating Kortney Schoenfelder/Extender: Tito Dine in Treatment: 20 Vital Signs Height(in): 63 Pulse(bpm): 45 Weight(lbs):  224.7 Blood Pressure(mmHg): 142/65 Body Mass Index(BMI): 40 Temperature(F): 99.3 Respiratory Rate 18 (breaths/min): Photos: [N/A:N/A] Wound Location: Left Lower Leg - Medial Left Lower Leg - Lateral N/A Wounding Event: Gradually Appeared Gradually Appeared N/A Primary Etiology: Lymphedema Venous Leg Ulcer N/A Comorbid History: Cataracts, Asthma, Sleep Cataracts, Asthma, Sleep N/A Apnea, Deep Vein Apnea, Deep Vein Thrombosis, Hypertension, Thrombosis, Hypertension, Peripheral Venous Disease, Peripheral Venous Disease, Osteoarthritis, Received Osteoarthritis, Received Chemotherapy, Received Chemotherapy, Received Radiation Radiation Date Acquired: 11/19/2018 01/19/2019 N/A Weeks of Treatment: 20 14 N/A Wound Status: Open Open N/A Measurements L x W x D 2.7x2.4x0.3 3.1x3.7x0.4 N/A (cm) Area (  cm) : 5.089 9.009 N/A Volume (cm) : 1.527 3.603 N/A % Reduction in Area: 40.40% -3995.00% N/A % Reduction in Volume: -78.60% -16277.30% N/A Classification: Full Thickness Without Full Thickness Without N/A Exposed Support Structures Exposed Support Structures Exudate Amount: Medium Medium N/A Exudate Type: Purulent Purulent N/A Exudate Color: yellow, brown, green yellow, brown, green N/A Wound Margin: Indistinct, nonvisible Indistinct, nonvisible N/A Granulation Amount: Small (1-33%) Small (1-33%) N/A Granulation Quality: Pink Pink N/A Necrotic Amount: Large (67-100%) Large (67-100%) N/A Exposed Structures: N/A MESHELL, ABDULAZIZ ([id number]) Fat Layer (Subcutaneous Fat Layer (Subcutaneous Tissue) Exposed: Yes Tissue) Exposed: Yes Fascia: No Fascia: No Tendon: No Tendon: No Muscle: No Muscle: No Joint: No Joint: No Bone: No Bone: No Epithelialization: None None N/A Debridement: Debridement - Excisional Debridement - Excisional N/A Pre-procedure 13:02 13:02 N/A Verification/Time Out Taken: Pain Control: Lidocaine Lidocaine N/A Tissue Debrided: Subcutaneous, Slough  Subcutaneous, Slough N/A Level: Skin/Subcutaneous Tissue Skin/Subcutaneous Tissue N/A Debridement Area (sq cm): 6.48 11.47 N/A Instrument: Curette Curette N/A Bleeding: Moderate Moderate N/A Hemostasis Achieved: Pressure Pressure N/A Debridement Treatment Procedure was tolerated well Procedure was tolerated well N/A Response: Post Debridement 2.7x2.4x0.2 3.1x3.7x0.4 N/A Measurements L x W x D (cm) Post Debridement Volume: 1.018 3.603 N/A (cm) Procedures Performed: Debridement Debridement N/A Treatment Notes Electronic Signature(s) Signed: 04/29/2019 4:13:17 PM By: Linton Ham MD Entered By: Linton Ham on 04/29/2019 13:09:17 [name], [name] ([id number]) -------------------------------------------------------------------------------- Multi-Disciplinary Care Plan Details Patient Name: FARIA, CASELLA. Date of Service: 04/29/2019 12:45 PM Medical Record Number: [id number] Patient Account Number: [id number] Date of Birth/Sex: [birth year] (73 y.o. F) Treating RN: Cornell Barman Primary Care Brittanie Dosanjh: [name] Other Clinician: Referring Macaria Bias: [name] Treating Codi Kertz/Extender: Tito Dine in Treatment: 20 Active Inactive Orientation to the Wound Care Program Nursing Diagnoses: Knowledge deficit related to the wound healing center program Goals: Patient/caregiver will verbalize understanding of the [place] Program Date Initiated: 12/10/2018 Target Resolution Date: 01/09/2019 Goal Status: Active Interventions: Provide education on orientation to the wound center Notes: Soft Tissue Infection Nursing Diagnoses: Impaired tissue integrity Goals: Patient's soft tissue infection will resolve Date Initiated: 12/10/2018 Target Resolution Date: 01/09/2019 Goal Status: Active Interventions: Assess signs and symptoms of infection every visit Notes: Venous Leg Ulcer Nursing Diagnoses: Actual venous Insuffiency (use after diagnosis is  confirmed) Goals: Patient will maintain optimal edema control Date Initiated: 12/10/2018 Target Resolution Date: 01/09/2019 Goal Status: Active Interventions: Assess peripheral edema status every visit. ARRABELLA, WESTERMAN ([id number]) Treatment Activities: Therapeutic compression applied : 12/10/2018 Notes: Wound/Skin Impairment Nursing Diagnoses: Impaired tissue integrity Goals: Patient/caregiver will verbalize understanding of skin care regimen Date Initiated: 12/10/2018 Target Resolution Date: 01/09/2019 Goal Status: Active Interventions: Assess ulceration(s) every visit Treatment Activities: Topical wound management initiated : 12/10/2018 Notes: Electronic Signature(s) Signed: 05/01/2019 4:38:11 PM By: [name], [name], RN, CWS, [name], [name] Entered By: [name], [name], RN, CWS, Kim on 04/29/2019 13:01:54 [name] ([id number]) -------------------------------------------------------------------------------- Pain Assessment Details Patient Name: MAYLYNN, ORZECHOWSKI. Date of Service: 04/29/2019 12:45 PM Medical Record Number: [id number] Patient Account Number: [id number] Date of Birth/Sex: 29-[name]-1946 (73 y.o. F) Treating RN: Montey Hora Primary Care Payden Docter: [name] Other Clinician: Referring Cadyn Rodger: [name] Treating Havish Petties/Extender: Tito Dine in Treatment: 20 Active Problems Location of Pain Severity and Description of Pain Patient Has Paino Yes Site Locations Pain Location: Pain in Ulcers With Dressing Change: Yes Duration of the Pain. Constant / Intermittento Constant Pain Management and Medication Current Pain Management: Electronic Signature(s) Signed: 04/30/2019 5:10:09 PM By: Marjory Lies,  Di Kindle Entered By: Montey Hora on 04/29/2019 12:42:02 [name] ([id number]) -------------------------------------------------------------------------------- Patient/Caregiver Education Details Patient Name: LAPORSHA, GREALISH. Date of Service: 04/29/2019  12:45 PM Medical Record Number: [id number] Patient Account Number: [id number] Date of Birth/Gender: 1946/07/16 (73 y.o. F) Treating RN: Cornell Barman Primary Care Physician: [name] Other Clinician: Referring Physician: [name] Treating Physician/Extender: Tito Dine in Treatment: 20 Education Assessment Education Provided To: Patient Education Topics Provided Wound Debridement: Handouts: Wound Debridement Methods: Demonstration, Explain/Verbal Responses: State content correctly Wound/Skin Impairment: Handouts: Caring for Your Ulcer Methods: Demonstration, Explain/Verbal Responses: State content correctly Electronic Signature(s) Signed: 05/01/2019 4:38:11 PM By: [name], [name], RN, CWS, [name], [name] Entered By: [name], [name], RN, CWS, Kim on 04/29/2019 13:05:53 [name] ([id number]) -------------------------------------------------------------------------------- Wound Assessment Details Patient Name: SHAREL, BEHNE. Date of Service: 04/29/2019 12:45 PM Medical Record Number: [id number] Patient Account Number: [id number] Date of Birth/Sex: 1946-09-05 (73 y.o. F) Treating RN: Montey Hora Primary Care Briseyda Fehr: [name] Other Clinician: Referring Zahrah Sutherlin: [name] Treating Abigayl Hor/Extender: Tito Dine in Treatment: 20 Wound Status Wound Number: 5 Primary Lymphedema Etiology: Wound Location: Left Lower Leg - Medial Wound Open Wounding Event: Gradually Appeared Status: Date Acquired: 11/19/2018 Comorbid Cataracts, Asthma, Sleep Apnea, Deep Vein Weeks Of Treatment: 20 History: Thrombosis, Hypertension, Peripheral Venous Clustered Wound: No Disease, Osteoarthritis, Received Chemotherapy, Received Radiation Photos Wound Measurements Length: (cm) 2.7 Width: (cm) 2.4 Depth: (cm) 0.3 Area: (cm) 5.089 Volume: (cm) 1.527 % Reduction in Area: 40.4% % Reduction in Volume: -78.6% Epithelialization: None Tunneling:  No Undermining: No Wound Description Full Thickness Without Exposed Support Classification: Structures Wound Margin: Indistinct, nonvisible Exudate Medium Amount: Exudate Type: Purulent Exudate Color: yellow, brown, green Foul Odor After Cleansing: No Slough/Fibrino Yes Wound Bed Granulation Amount: Small (1-33%) Exposed Structure Granulation Quality: Pink Fascia Exposed: No Necrotic Amount: Large (67-100%) Fat Layer (Subcutaneous Tissue) Exposed: Yes Necrotic Quality: Adherent Slough Tendon Exposed: No Muscle Exposed: No Joint Exposed: No Bone Exposed: No [place], Leiloni J. ([id number]) Treatment Notes Wound #5 (Left, Medial Lower Leg) Notes sorbact, ABD, 3 layer Electronic Signature(s) Signed: 04/30/2019 5:10:09 PM By: Montey Hora Entered By: Montey Hora on 04/29/2019 12:55:05 Karen, [name] ([id number]) -------------------------------------------------------------------------------- Wound Assessment Details Patient Name: GAYNELL, EGGLETON. Date of Service: 04/29/2019 12:45 PM Medical Record Number: [id number] Patient Account Number: [id number] Date of Birth/Sex: April 27, 1946 (73 y.o. F) Treating RN: Montey Hora Primary Care Calisa Luckenbaugh: [name] Other Clinician: Referring Rishikesh Khachatryan: [name] Treating Gillermo Poch/Extender: Tito Dine in Treatment: 20 Wound Status Wound Number: 6 Primary Venous Leg Ulcer Etiology: Wound Location: Left Lower Leg - Lateral Wound Open Wounding Event: Gradually Appeared Status: Date Acquired: 01/19/2019 Comorbid Cataracts, Asthma, Sleep Apnea, Deep Vein Weeks Of Treatment: 14 History: Thrombosis, Hypertension, Peripheral Venous Clustered Wound: No Disease, Osteoarthritis, Received Chemotherapy, Received Radiation Photos Wound Measurements Length: (cm) 3.1 Width: (cm) 3.7 Depth: (cm) 0.4 Area: (cm) 9.009 Volume: (cm) 3.603 % Reduction in Area: -3995% % Reduction in Volume:  -16277.3% Epithelialization: None Tunneling: No Undermining: No Wound Description Full Thickness Without Exposed Support Classification: Structures Wound Margin: Indistinct, nonvisible Exudate Medium Amount: Exudate Type: Purulent Exudate Color: yellow, brown, green Foul Odor After Cleansing: No Slough/Fibrino Yes Wound Bed Granulation Amount: Small (1-33%) Exposed Structure Granulation Quality: Pink Fascia Exposed: No Necrotic Amount: Large (67-100%) Fat Layer (Subcutaneous Tissue) Exposed: Yes Necrotic Quality: Adherent Slough Tendon Exposed: No Muscle Exposed: No Joint Exposed: No Bone Exposed: No Awad[place]ah, Damarys J. ([id number]) Treatment Notes Wound #6 (Left, Lateral Lower  Leg) Notes sorbact, ABD, 3 layer Electronic Signature(s) Signed: 04/30/2019 5:10:09 PM By: Montey Hora Entered By: Montey Hora on 04/29/2019 12:55:33 Oyervides, [name] ([id number]) -------------------------------------------------------------------------------- Vitals Details Patient Name: NYLA, CREASON. Date of Service: 04/29/2019 12:45 PM Medical Record Number: [id number] Patient Account Number: [id number] Date of Birth/Sex: [birth year] (73 y.o. F) Treating RN: Montey Hora Primary Care Alwilda Gilland: [name] Other Clinician: Referring Lynx Goodrich: [name] Treating Cera Rorke/Extender: Tito Dine in Treatment: 20 Vital Signs Time Taken: 12:42 Temperature (F): 99.3 Height (in): 63 Pulse (bpm): 89 Weight (lbs): 224.7 Respiratory Rate (breaths/min): 18 Body Mass Index (BMI): 39.8 Blood Pressure (mmHg): 142/65 Reference Range: 80 - 120 mg / dl Electronic Signature(s) Signed: 04/30/2019 5:10:09 PM By: Montey Hora Entered By: Montey Hora on 04/29/2019 12:42:51

## 2019-05-04 ENCOUNTER — Encounter: Payer: Self-pay | Admitting: Podiatry

## 2019-05-04 ENCOUNTER — Other Ambulatory Visit: Payer: Self-pay

## 2019-05-04 ENCOUNTER — Ambulatory Visit (INDEPENDENT_AMBULATORY_CARE_PROVIDER_SITE_OTHER): Payer: Medicare Other | Admitting: Podiatry

## 2019-05-04 DIAGNOSIS — M79675 Pain in left toe(s): Secondary | ICD-10-CM

## 2019-05-04 DIAGNOSIS — M79674 Pain in right toe(s): Secondary | ICD-10-CM

## 2019-05-04 DIAGNOSIS — B351 Tinea unguium: Secondary | ICD-10-CM

## 2019-05-04 NOTE — Progress Notes (Signed)

## 2019-05-06 ENCOUNTER — Other Ambulatory Visit: Payer: Self-pay

## 2019-05-06 ENCOUNTER — Encounter: Payer: Medicare Other | Attending: Internal Medicine | Admitting: Internal Medicine

## 2019-05-06 DIAGNOSIS — I890 Lymphedema, not elsewhere classified: Secondary | ICD-10-CM | POA: Diagnosis not present

## 2019-05-06 DIAGNOSIS — L97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d: Secondary | ICD-10-CM | POA: Diagnosis not present

## 2019-05-06 DIAGNOSIS — I87312 Chronic venous hypertension (idiopathic) with ulcer of left lower extremity: Secondary | ICD-10-CM | POA: Diagnosis not present

## 2019-05-06 DIAGNOSIS — I87332 Chronic venous hypertension (idiopathic) with ulcer and inflammation of left lower extremity: Secondary | ICD-10-CM | POA: Diagnosis not present

## 2019-05-06 DIAGNOSIS — L97221 Non-pressure chronic ulcer of left calf limited to breakdown of skin: Secondary | ICD-10-CM | POA: Insufficient documentation

## 2019-05-06 DIAGNOSIS — L97222 Non-pressure chronic ulcer of left calf with fat layer exposed: Secondary | ICD-10-CM | POA: Diagnosis not present

## 2019-05-11 DIAGNOSIS — M25569 Pain in unspecified knee: Secondary | ICD-10-CM | POA: Diagnosis not present

## 2019-05-11 DIAGNOSIS — I878 Other specified disorders of veins: Secondary | ICD-10-CM | POA: Diagnosis not present

## 2019-05-11 DIAGNOSIS — S81802A Unspecified open wound, left lower leg, initial encounter: Secondary | ICD-10-CM | POA: Diagnosis not present

## 2019-05-11 DIAGNOSIS — M1120 Other chondrocalcinosis, unspecified site: Secondary | ICD-10-CM | POA: Diagnosis not present

## 2019-05-11 DIAGNOSIS — R768 Other specified abnormal immunological findings in serum: Secondary | ICD-10-CM | POA: Diagnosis not present

## 2019-05-11 DIAGNOSIS — M1990 Unspecified osteoarthritis, unspecified site: Secondary | ICD-10-CM | POA: Diagnosis not present

## 2019-05-12 NOTE — Progress Notes (Signed)
KARUNA, BALDUCCI ([id number]) Visit Report for 05/06/2019 Arrival Information Details Patient Name: [name], [name]. Date of Service: 05/06/2019 12:45 PM Medical Record Number: [id number] Patient Account Number: [id number] Date of Birth/Sex: [birth year] (73 y.o. F) Treating RN: Harold Barban Primary Care Tashon Capp: [name] Other Clinician: Referring Nikolina Simerson: [name] Treating Ilze Roselli/Extender: Tito Dine in Treatment: 21 Visit Information History Since Last Visit Added or deleted any medications: No Patient Arrived: Ambulatory Any new allergies or adverse reactions: No Arrival Time: 12:41 Had a fall or experienced change in No Accompanied By: self activities of daily living that may affect Transfer Assistance: None risk of falls: Patient Identification Verified: Yes Signs or symptoms of abuse/neglect since last visito No Secondary Verification Process Completed: Yes Hospitalized since last visit: No Has Dressing in Place as Prescribed: Yes Has Compression in Place as Prescribed: Yes Pain Present Now: No Electronic Signature(s) Signed: 05/07/2019 9:59:53 AM By: Harold Barban Entered By: Harold Barban on 05/06/2019 12:42:23 Tworek, [name] ([id number]) -------------------------------------------------------------------------------- Encounter Discharge Information Details Patient Name: [name]. Date of Service: 05/06/2019 12:45 PM Medical Record Number: [id number] Patient Account Number: [id number] Date of Birth/Sex: [birth year] (73 y.o. F) Treating RN: Army Melia Primary Care Keevan Wolz: [name] Other Clinician: Referring Romain Erion: [name] Treating Andra Matsuo/Extender: Tito Dine in Treatment: 21 Encounter Discharge Information Items Post Procedure Vitals Discharge Condition: Stable Temperature (F): 98.2 Ambulatory Status: Ambulatory Pulse (bpm): 90 Discharge Destination: Home Respiratory Rate  (breaths/min): 18 Transportation: Private Auto Blood Pressure (mmHg): 133/81 Accompanied By: self Schedule Follow-up Appointment: Yes Clinical Summary of Care: Electronic Signature(s) Signed: 05/06/2019 2:18:36 PM By: Army Melia Entered By: Army Melia on 05/06/2019 13:46:54 Sciarra, [name] ([id number]) -------------------------------------------------------------------------------- Lower Extremity Assessment Details Patient Name: [name]. Date of Service: 05/06/2019 12:45 PM Medical Record Number: [id number] Patient Account Number: [id number] Date of Birth/Sex: 1946-08-23 (73 y.o. F) Treating RN: Harold Barban Primary Care Florie Carico: [name] Other Clinician: Referring Damian Hofstra: [name] Treating Keionna Kinnaird/Extender: Tito Dine in Treatment: 21 Edema Assessment Assessed: [Left: No] [Right: No] [Left: Edema] [Right: :] Calf Left: Right: Point of Measurement: 31 cm From Medial Instep 43.5 cm cm Ankle Left: Right: Point of Measurement: 13 cm From Medial Instep 23 cm cm Vascular Assessment Pulses: Dorsalis Pedis Palpable: [Left:Yes] Posterior Tibial Palpable: [Left:Yes] Electronic Signature(s) Signed: 05/07/2019 9:59:53 AM By: Harold Barban Entered By: Harold Barban on 05/06/2019 12:45:58 Diniz, [name] ([id number]) -------------------------------------------------------------------------------- Multi Wound Chart Details Patient Name: [name]. Date of Service: 05/06/2019 12:45 PM Medical Record Number: [id number] Patient Account Number: [id number] Date of Birth/Sex: 1946/07/22 (73 y.o. F) Treating RN: Cornell Barman Primary Care Kemaria Dedic: [name] Other Clinician: Referring Samar Dass: [name] Treating Cristoval Teall/Extender: Tito Dine in Treatment: 21 Vital Signs Height(in): 63 Pulse(bpm): 90 Weight(lbs): 224.7 Blood Pressure(mmHg): 133/81 Body Mass Index(BMI): 40 Temperature(F): 98.2 Respiratory  Rate 18 (breaths/min): Photos: [N/A:N/A] Wound Location: Left Lower Leg - Medial Left Lower Leg - Lateral N/A Wounding Event: Gradually Appeared Gradually Appeared N/A Primary Etiology: Lymphedema Venous Leg Ulcer N/A Comorbid History: Cataracts, Asthma, Sleep Cataracts, Asthma, Sleep N/A Apnea, Deep Vein Apnea, Deep Vein Thrombosis, Hypertension, Thrombosis, Hypertension, Peripheral Venous Disease, Peripheral Venous Disease, Osteoarthritis, Received Osteoarthritis, Received Chemotherapy, Received Chemotherapy, Received Radiation Radiation Date Acquired: 11/19/2018 01/19/2019 N/A Weeks of Treatment: 21 15 N/A Wound Status: Open Open N/A Measurements L x W x D 2.5x2.5x0.3 3.1x3.5x0.4 N/A (cm) Area (cm) : 4.909 8.522 N/A Volume (cm) : 1.473 3.409 N/A % Reduction in Area: 42.60% -  3773.60% N/A % Reduction in Volume: -72.30% -15395.50% N/A Classification: Full Thickness Without Full Thickness Without N/A Exposed Support Structures Exposed Support Structures Exudate Amount: Medium Medium N/A Exudate Type: Purulent Purulent N/A Exudate Color: yellow, brown, green yellow, brown, green N/A Wound Margin: Indistinct, nonvisible Indistinct, nonvisible N/A Granulation Amount: Small (1-33%) Small (1-33%) N/A Granulation Quality: Pink Pink N/A Necrotic Amount: Large (67-100%) Large (67-100%) N/A Exposed Structures: N/A [name] ([id number]) Fat Layer (Subcutaneous Fat Layer (Subcutaneous Tissue) Exposed: Yes Tissue) Exposed: Yes Fascia: No Fascia: No Tendon: No Tendon: No Muscle: No Muscle: No Joint: No Joint: No Bone: No Bone: No Epithelialization: None None N/A Debridement: Debridement - Excisional Debridement - Excisional N/A Pre-procedure 12:57 12:57 N/A Verification/Time Out Taken: Pain Control: Lidocaine Lidocaine N/A Tissue Debrided: Subcutaneous, Slough Subcutaneous, Slough N/A Level: Skin/Subcutaneous Tissue Skin/Subcutaneous Tissue N/A Debridement Area (sq  cm): 6.25 10.85 N/A Instrument: Curette Curette N/A Bleeding: Minimum Minimum N/A Hemostasis Achieved: Pressure Pressure N/A Debridement Treatment Procedure was tolerated well Procedure was tolerated well N/A Response: Post Debridement 2.5x2.5x0.4 3.1x3.4x0.4 N/A Measurements L x W x D (cm) Post Debridement Volume: 1.963 3.311 N/A (cm) Procedures Performed: Debridement Debridement N/A Treatment Notes Electronic Signature(s) Signed: 05/06/2019 6:06:19 PM By: Linton Ham MD Entered By: Linton Ham on 05/06/2019 13:21:39 Castagna, [name] ([id number]) -------------------------------------------------------------------------------- Multi-Disciplinary Care Plan Details Patient Name: ATHIRA, JANOWICZ. Date of Service: 05/06/2019 12:45 PM Medical Record Number: [id number] Patient Account Number: [id number] Date of Birth/Sex: 01/25/1946 (73 y.o. F) Treating RN: Cornell Barman Primary Care Sury Wentworth: [name] Other Clinician: Referring Oluwatoyin Banales: [name] Treating Melane Windholz/Extender: Tito Dine in Treatment: 21 Active Inactive Orientation to the Wound Care Program Nursing Diagnoses: Knowledge deficit related to the wound healing center program Goals: Patient/caregiver will verbalize understanding of the [facility] Date Initiated: 12/10/2018 Target Resolution Date: 01/09/2019 Goal Status: Active Interventions: Provide education on orientation to the wound center Notes: Soft Tissue Infection Nursing Diagnoses: Impaired tissue integrity Goals: Patient's soft tissue infection will resolve Date Initiated: 12/10/2018 Target Resolution Date: 01/09/2019 Goal Status: Active Interventions: Assess signs and symptoms of infection every visit Notes: Venous Leg Ulcer Nursing Diagnoses: Actual venous Insuffiency (use after diagnosis is confirmed) Goals: Patient will maintain optimal edema control Date Initiated: 12/10/2018 Target Resolution Date:  01/09/2019 Goal Status: Active Interventions: Assess peripheral edema status every visit. SHELIE, LANSING ([id number]) Treatment Activities: Therapeutic compression applied : 12/10/2018 Notes: Wound/Skin Impairment Nursing Diagnoses: Impaired tissue integrity Goals: Patient/caregiver will verbalize understanding of skin care regimen Date Initiated: 12/10/2018 Target Resolution Date: 01/09/2019 Goal Status: Active Interventions: Assess ulceration(s) every visit Treatment Activities: Topical wound management initiated : 12/10/2018 Notes: Electronic Signature(s) Signed: 05/11/2019 5:43:16 PM By: [name], [name], RN, CWS, [name], [name] Entered By: [name], [name], RN, CWS, Kim on 05/06/2019 12:58:09 [name] ([id number]) -------------------------------------------------------------------------------- Pain Assessment Details Patient Name: KRESHA, ABELSON. Date of Service: 05/06/2019 12:45 PM Medical Record Number: [id number] Patient Account Number: [id number] Date of Birth/Sex: February 18, 1946 (73 y.o. F) Treating RN: Harold Barban Primary Care Jaylee Freeze: [name] Other Clinician: Referring Kyzer Blowe: [name] Treating Chaitra Mast/Extender: Tito Dine in Treatment: 21 Active Problems Location of Pain Severity and Description of Pain Patient Has Paino No Site Locations Pain Management and Medication Current Pain Management: Electronic Signature(s) Signed: 05/07/2019 9:59:53 AM By: Harold Barban Entered By: Harold Barban on 05/06/2019 12:42:42 [name] ([id number]) -------------------------------------------------------------------------------- Patient/Caregiver Education Details Patient Name: MAZZIE, BRODRICK. Date of Service: 05/06/2019 12:45 PM Medical Record Number: [id number] Patient Account  Number: [id number] Date of Birth/Gender: 1945-11-09 (73 y.o. F) Treating RN: Cornell Barman Primary Care Physician: [name] Other Clinician: Referring Physician:  [name] Treating Physician/Extender: Tito Dine in Treatment: 21 Education Assessment Education Provided To: Patient Education Topics Provided Wound/Skin Impairment: Handouts: Caring for Your Ulcer, Other: continue wound care as prescribed. Methods: Demonstration, Explain/Verbal Responses: State content correctly Electronic Signature(s) Signed: 05/11/2019 5:43:16 PM By: [name], [name], RN, CWS, [name], [name] Entered By: [name], [name], RN, CWS, Kim on 05/06/2019 13:03:15 AYLEAH, HOFMEISTER ([id number]) -------------------------------------------------------------------------------- Wound Assessment Details Patient Name: DONELLE, HISE. Date of Service: 05/06/2019 12:45 PM Medical Record Number: [id number] Patient Account Number: [id number] Date of Birth/Sex: 01/05/1946 (73 y.o. F) Treating RN: Harold Barban Primary Care Brylee Berk: [name] Other Clinician: Referring Kamoni Gentles: [name] Treating Raylan Hanton/Extender: Tito Dine in Treatment: 21 Wound Status Wound Number: 5 Primary Lymphedema Etiology: Wound Location: Left Lower Leg - Medial Wound Open Wounding Event: Gradually Appeared Status: Date Acquired: 11/19/2018 Comorbid Cataracts, Asthma, Sleep Apnea, Deep Vein Weeks Of Treatment: 21 History: Thrombosis, Hypertension, Peripheral Venous Clustered Wound: No Disease, Osteoarthritis, Received Chemotherapy, Received Radiation Photos Wound Measurements Length: (cm) 2.5 Width: (cm) 2.5 Depth: (cm) 0.3 Area: (cm) 4.909 Volume: (cm) 1.473 % Reduction in Area: 42.6% % Reduction in Volume: -72.3% Epithelialization: None Tunneling: No Undermining: No Wound Description Full Thickness Without Exposed Support Classification: Structures Wound Margin: Indistinct, nonvisible Exudate Medium Amount: Exudate Type: Purulent Exudate Color: yellow, brown, green Foul Odor After Cleansing: No Slough/Fibrino Yes Wound Bed Granulation  Amount: Small (1-33%) Exposed Structure Granulation Quality: Pink Fascia Exposed: No Necrotic Amount: Large (67-100%) Fat Layer (Subcutaneous Tissue) Exposed: Yes Necrotic Quality: Adherent Slough Tendon Exposed: No Muscle Exposed: No Joint Exposed: No Bone Exposed: No Terhaar, Ailyne J. ([id number]) Treatment Notes Wound #5 (Left, Medial Lower Leg) Notes sorbact, ABD, 3 layer Electronic Signature(s) Signed: 05/07/2019 9:59:53 AM By: Harold Barban Entered By: Harold Barban on 05/06/2019 12:51:32 Koenig, [name] ([id number]) -------------------------------------------------------------------------------- Wound Assessment Details Patient Name: VALINDA, [name] Date of Service: 05/06/2019 12:45 PM Medical Record Number: [id number] Patient Account Number: [id number] Date of Birth/Sex: 11/04/1946 (73 y.o. F) Treating RN: Harold Barban Primary Care Kenner Lewan: [name] Other Clinician: Referring Wilna Pennie: [name] Treating Eduin Friedel/Extender: Tito Dine in Treatment: 21 Wound Status Wound Number: 6 Primary Venous Leg Ulcer Etiology: Wound Location: Left Lower Leg - Lateral Wound Open Wounding Event: Gradually Appeared Status: Date Acquired: 01/19/2019 Comorbid Cataracts, Asthma, Sleep Apnea, Deep Vein Weeks Of Treatment: 15 History: Thrombosis, Hypertension, Peripheral Venous Clustered Wound: No Disease, Osteoarthritis, Received Chemotherapy, Received Radiation Photos Wound Measurements Length: (cm) 3.1 Width: (cm) 3.5 Depth: (cm) 0.4 Area: (cm) 8.522 Volume: (cm) 3.409 % Reduction in Area: -3773.6% % Reduction in Volume: -15395.5% Epithelialization: None Tunneling: No Undermining: No Wound Description Full Thickness Without Exposed Support Classification: Structures Wound Margin: Indistinct, nonvisible Exudate Medium Amount: Exudate Type: Purulent Exudate Color: yellow, brown, green Foul Odor After Cleansing: No Slough/Fibrino  Yes Wound Bed Granulation Amount: Small (1-33%) Exposed Structure Granulation Quality: Pink Fascia Exposed: No Necrotic Amount: Large (67-100%) Fat Layer (Subcutaneous Tissue) Exposed: Yes Necrotic Quality: Adherent Slough Tendon Exposed: No Muscle Exposed: No Joint Exposed: No Bone Exposed: No Mariner, Kadesha J. ([id number]) Treatment Notes Wound #6 (Left, Lateral Lower Leg) Notes sorbact, ABD, 3 layer Electronic Signature(s) Signed: 05/06/2019 12:56:09 PM By: Montey Hora Signed: 05/07/2019 9:59:53 AM By: Harold Barban Entered By: Montey Hora on 05/06/2019 12:56:09 Aguallo, [name] ([id number]) -------------------------------------------------------------------------------- Vitals Details Patient Name: DABNEY,  Margherita J. Date of Service: 05/06/2019 12:45 PM Medical Record Number: [id number] Patient Account Number: [id number] Date of Birth/Sex: [birth year] (73 y.o. F) Treating RN: Harold Barban Primary Care Telma Pyeatt: [name] Other Clinician: Referring Enaya Howze: [name] Treating Guenther Dunshee/Extender: Tito Dine in Treatment: 21 Vital Signs Time Taken: 12:40 Temperature (F): 98.2 Height (in): 63 Pulse (bpm): 90 Weight (lbs): 224.7 Respiratory Rate (breaths/min): 18 Body Mass Index (BMI): 39.8 Blood Pressure (mmHg): 133/81 Reference Range: 80 - 120 mg / dl Electronic Signature(s) Signed: 05/07/2019 9:59:53 AM By: Harold Barban Entered By: Harold Barban on 05/06/2019 12:43:23

## 2019-05-12 NOTE — Progress Notes (Signed)
ENGLISH, TOMER ([id number]) Visit Report for 05/06/2019 Debridement Details Patient Name: [name], [name]. Date of Service: 05/06/2019 12:45 PM Medical Record Number: [id number] Patient Account Number: [id number] Date of Birth/Sex: 17-Oct-1946 (73 y.o. F) Treating RN: Cornell Barman Primary Care Provider: [name] Other Clinician: Referring Provider: [name] Treating Provider/Extender: Tito Dine in Treatment: 21 Debridement Performed for Wound #5 Left,Medial Lower Leg Assessment: Performed By: Physician [name] Dillon, MD Debridement Type: Debridement Level of Consciousness (Pre- Awake and Alert procedure): Pre-procedure Verification/Time Yes - 12:57 Out Taken: Start Time: 12:57 Pain Control: Lidocaine Total Area Debrided (L x W): 2.5 (cm) x 2.5 (cm) = 6.25 (cm) Tissue and other material Viable, Non-Viable, Slough, Subcutaneous, Slough debrided: Level: Skin/Subcutaneous Tissue Debridement Description: Excisional Instrument: Curette Bleeding: Minimum Hemostasis Achieved: Pressure End Time: 13:02 Response to Treatment: Procedure was tolerated well Level of Consciousness Awake and Alert (Post-procedure): Post Debridement Measurements of Total Wound Length: (cm) 2.5 Width: (cm) 2.5 Depth: (cm) 0.4 Volume: (cm) 1.963 Character of Wound/Ulcer Post Debridement: Stable Post Procedure Diagnosis Same as Pre-procedure Electronic Signature(s) Signed: 05/06/2019 6:06:19 PM By: Linton Ham MD Signed: 05/11/2019 5:43:16 PM By: [name], [name], RN, CWS, [name], [name] Entered By: Linton Ham on 05/06/2019 13:24:49 Mendenhall, [name] ([id number]) -------------------------------------------------------------------------------- Debridement Details Patient Name: [name], [name]. Date of Service: 05/06/2019 12:45 PM Medical Record Number: [id number] Patient Account Number: [id number] Date of Birth/Sex: 1946-09-10 (72 y.o. F) Treating RN: Cornell Barman Primary Care  Provider: [name] Other Clinician: Referring Provider: [name] Treating Provider/Extender: Tito Dine in Treatment: 21 Debridement Performed for Wound #6 Left,Lateral Lower Leg Assessment: Performed By: Physician [name] Dillon, MD Debridement Type: Debridement Severity of Tissue Pre Fat layer exposed Debridement: Level of Consciousness (Pre- Awake and Alert procedure): Pre-procedure Verification/Time Yes - 12:57 Out Taken: Start Time: 12:57 Pain Control: Lidocaine Total Area Debrided (L x W): 3.1 (cm) x 3.5 (cm) = 10.85 (cm) Tissue and other material Viable, Non-Viable, Slough, Subcutaneous, Slough debrided: Level: Skin/Subcutaneous Tissue Debridement Description: Excisional Instrument: Curette Bleeding: Minimum Hemostasis Achieved: Pressure End Time: 13:02 Response to Treatment: Procedure was tolerated well Level of Consciousness Awake and Alert (Post-procedure): Post Debridement Measurements of Total Wound Length: (cm) 3.1 Width: (cm) 3.4 Depth: (cm) 0.4 Volume: (cm) 3.311 Character of Wound/Ulcer Post Debridement: Stable Severity of Tissue Post Debridement: Fat layer exposed Post Procedure Diagnosis Same as Pre-procedure Electronic Signature(s) Signed: 05/06/2019 6:06:19 PM By: Linton Ham MD Signed: 05/11/2019 5:43:16 PM By: [name], [name], RN, CWS, [name], [name] Entered By: Linton Ham on 05/06/2019 13:25:03 [name], [name] ([id number]) -------------------------------------------------------------------------------- HPI Details Patient Name: [name], [name]. Date of Service: 05/06/2019 12:45 PM Medical Record Number: [id number] Patient Account Number: [id number] Date of Birth/Sex: 08-29-1946 (73 y.o. F) Treating RN: Cornell Barman Primary Care Provider: [name] Other Clinician: Referring Provider: [name] Treating Provider/Extender: Tito Dine in Treatment: 21 History of Present Illness HPI  Description: Pleasant 73 year old with history of chronic venous insufficiency. No diabetes or peripheral vascular disease. Left ABI 1.29. Questionable history of left lower extremity DVT. She developed a recurrent ulceration on her left lateral calf in December 2015, which she attributes to poor diet and subsequent lower extremity edema. She underwent endovenous laser ablation of her left greater saphenous vein in 2010. She underwent laser ablation of accessory branch of left GSV in April 2016 by Dr. Kellie Simmering at [facility]. She was previously wearing Unna boots, which she tolerated well. Tolerating 2 layer compression and cadexomer  iodine. She returns to clinic for follow-up and is without new complaints. She denies any significant pain at this time. She reports persistent pain with pressure. No claudication or ischemic rest pain. No fever or chills. No drainage. READMISSION 11/13/16; this is a 73 year old woman who is not a diabetic. She is here for a review of a painful area on her left medial lower extremity. I note that she was seen here previously last year for wound I believe to be in the same area. At that time she had undergone previously a left greater saphenous vein ablation by Dr. Kellie Simmering and she had a ablation of the anterior accessory branch of the left greater saphenous vein in March 2016. Seeing that the wound actually closed over. In reviewing the history with her today the ulcer in this area has been recurrent. She describes a biopsy of this area in 2009 that only showed stasis physiology. 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 Do not see that she has ever had arterial studies in the left leg. She tells me she has a follow-up with Dr. Kellie Simmering in roughly 10 days In  any case she developed the reopening of this area roughly a month ago. On the background of this she describes rapidly increasing edema which has responded to Lasix 40 mg and metolazone 2.5 mg as well as the patient's lymph massage. She has been told she has both venous insufficiency and lymphedema but she cannot tolerate compression stockings 11/28/16; the patient saw Dr. Kellie Simmering recently. Per the patient he did arterial Dopplers in the office that did not show evidence of arterial insufficiency, per the patient he stated "treat this like an ordinary venous ulcer". She also saw her dermatologist Dr. Ronnald Ramp who felt that this was more of a vascular ulcer. In general things are improving although she arrives today with increasing bilateral lower extremity edema with weeping a deeper fluid through the wound on the left medial leg compatible with some degree of lymphedema 12/04/16; the patient's wound is fully epithelialized but I don't think fully healed. We will do another week of depression with Promogran and TCA however I suspect we'll be able to discharge her next week. This is a very unusual-looking wound which was initially a figure-of-eight type wound lying on its side surrounded by petechial like hemorrhage. She has had venous ablation on this side. She apparently does not have an arterial issue per Dr. Kellie Simmering. She saw her dermatologist thought it was "vascular". Patient is definitely going to need ongoing compression and I talked about this with her today she will go to elastic therapy after she leaves here next week 12/11/16; the patient's wound is not completely closed today. She has surrounding scar tissue and in further discussion with the patient it would appear that she had ulcers in this area in 2009 for a prolonged period of time ultimately requiring a punch biopsy of this area that only showed venous insufficiency. I did not previously pickup on this part of the history from  the patient. 12/18/16; the patient's wound is completely epithelialized. There is no open area here. She has significant bilateral venous insufficiency with secondary lymphedema to a mild-to-moderate degree she does not have compression stockings.. She did not say anything to me when I  was in the room, she told our intake nurse that she was still having pain in this area. This isn't unusual recurrent small open area. She is going to go to elastic therapy to obtain compression stockings. 12/25/16; the patient's wound is fully epithelialized. There is no open area here. The patient describes some continued episodic discomfort in this area medial left calf. However everything looks fine and healed here. She is been to elastic therapy and caught herself 15-20 mmHg stockings, they apparently were having trouble getting 20-30 mm stockings in her size [name], [name] ([id number]) 01/22/17; this is a patient we discharged from the clinic a month ago. She has a recurrent open wound on her medial left calf. She had 15 mm support stockings. I told her I thought she needed 20-30 mm compression stockings. She tells me that she has been ill with hospitalization secondary to asthma and is been found to have severe hypokalemia likely secondary to a combination of Lasix and metolazone. This morning she noted blistering and leaking fluid on the posterior part of her left leg. She called our intake nurse urgently and we was saw her this afternoon. She has not had any real discomfort here. I don't know that she's been wearing any stockings on this leg for at least 2-3 days. ABIs in this clinic were 1.21 on the right and 1.3 on the left. She is previously seen vascular surgery who does not think that there is a peripheral arterial issue. 01/30/17; Patient arrives with no open wound on the left leg. She has been to elastic therapy and obtained 20-20mmhg below knee stockings and she has one on the right leg  today. READMISSION 02/19/18; this Bachand is a now 73 year old patient we've had in this clinic perhaps 3 times before. I had last looked at her from January 07 December 2016 with an area on the medial left leg. We discharged her on 12/25/16 however she had to be readmitted on 01/22/17 with a recurrence. I have in my notes that we discharged her on 20-30 mm stockings although she tells me she was only wearing support hose because she cannot get stockings on predominantly related to her cervical spine surgery/issues. She has had previous ablations done by vein and vascular in [place] including a great saphenous vein ablation on the left with an anterior accessory branch ablation I think both of these were in 2016. On one of the previous visit she had a biopsy noted 2009 that was negative. She is not felt to have an arterial issue. She is not a diabetic. She does have a history of obstructive sleep apnea hypertension asthma as well as chronic venous insufficiency and lymphedema. On this occasion she noted 2 dry scaly patch on her left leg. She tried to put lotion on this it didn't really help. There were 2 open areas.the patient has been seeing her primary physician from 02/05/18 through 02/14/18. She had Unna boots applied. The superior wound now on the lateral left leg has closed but she's had one wound that remains open on the lateral left leg. This is not the same spot as we dealt with in 2018. ABIs in this clinic were 1.3 bilaterally 02/26/18; patient has a small wound on the left lateral calf. Dimensions are down. She has chronic venous insufficiency and lymphedema. 03/05/18; small open area on the left lateral calf. Dimensions are down. Tightly adherent necrotic debris over the surface of the wound which was difficult to remove. Also the dressing [over collagen]  stuck to the wound surface. This was removed with some difficulty as well. Change the primary dressing to Hydrofera Blue ready 03/12/18;  small open area on the left lateral calf. Comes in with tightly adherent surface eschar as well as some adherent Hydrofera Blue. 03/19/18; open area on the left lateral calf. Again adherent surface eschar as well as some adherent Hydrofera Blue nonviable subcutaneous tissue. She complained of pain all week even with the reduction from 4-3 layer compression I put on last week. Also she had an increase in her ankle and calf measurements probably related to the same thing. 03/26/18; open area on the left lateral calf. A very small open area remains here. We used silver alginate starting last week as the Hydrofera Blue seem to stick to the wound bed. In using 4-layer compression 04/02/18; the open area in the left lateral calf at some adherent slough which I removed there is no open area here. We are able to transition her into her own compression stocking. Truthfully I think this is probably his support hose. However this does not maintain skin integrity will be limited. She cannot put over the toe compression stockings on because of neck problems hand problems etc. She is allergic to the lining layer of juxta lites. We might be forced to use extremitease stocking should this fail READMIT 11/24/2018 Patient is now a 73 year old woman who is not a diabetic. She has been in this clinic on at least 3 previous occasions largely with recurrent wounds on her left leg secondary to chronic venous insufficiency with secondary lymphedema. Her situation is complicated by inability to get stockings on and an allergy to neoprene which is apparently a component and at least juxta lites and other stockings. As a result she really has not been wearing any stockings on her legs. She tells [place] that roughly 2 or 3 weeks ago she started noticing a stinging sensation just above her ankle on the left medial aspect. She has been diagnosed with pseudogout and she wondered whether this was what she was experiencing. She tried to  dress this with something she bought at the store however subsequently it pulled skin off and now she has an open wound that is not improving. She has been using Vaseline gauze with a cover bandage. She saw her primary doctor last week who put an Haematologist on her. ABIs in this clinic was 1.03 on the left TAKASHA, VETERE. ([id number]) 2/12; the area is on the left medial ankle. Odd-looking wound with what looks to be surface epithelialization but a multitude of small petechial openings. This clearly not closed yet. We have been using silver alginate under 3 layer compression with TCA 2/19; the wound area did not look quite as good this week. Necrotic debris over the majority of the wound surface which required debridement. She continues to have a multitude of what looked to be small petechial openings. She reminds [place] that she had a biopsy on this initially during her first outbreak in 2015 in [place] [facility]. She expresses concern about this being a possible melanoma. 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 I assured her that this does not look anything like melanoma. Except for the petechial reaction it does look like a venous insufficiency area and she certainly has evidence of this on both sides 2/26; a difficult area on the left medial ankle. The patient clearly has chronic venous hypertension  with some degree of lymphedema. The odd thing about the area is the small petechial hemorrhages. I am not really sure how to explain this. This was present last time and this is not a compression injury. We have been using Hydrofera Blue which I changed to last week 3/4; still using Hydrofera Blue. Aggressive debridement today. She does not have known arterial issues. She has seen Dr. Kellie Simmering at [facility] vein and vascular and and has an ablation on the left. [Anterior accessory branch of the  greater saphenous]. From what I remember they did not feel she had an arterial issue. The patient has had this area biopsied in 2009 at [facility] and by her recollection they said this was "stasis". She is also follow-up with dermatology locally who thought that this was more of a vascular issue 3/11; using Hydrofera Blue. Aggressive debridement today. She does not have an arterial issue. We are using 3 layer compression although we may need to go to 4. The patient has been in for multiple changes to her wrap since I last saw her a week ago. She says that the area was leaking. I do not have too much more information on what was found 01/19/19 on evaluation today patient was actually being seen for a nurse visit when unfortunately she had the area on her left lateral lower extremity as well as weeping from the right lower extremity that became apparent. Therefore we did end up actually seeing her for a full visit with myself. She is having some pain at this site as well but fortunately nothing too significant at this point. No fevers, chills, nausea, or vomiting noted at this time. 3/18-Patient is back to the clinic with the left leg venous leg ulcer, the ulcer is larger in size, has a surface that is densely adherent with fibrinous tissue, the Hydrofera Blue was used but is densely adherent and there was difficulty in removing it. The right lower extremity was also wrapped for weeping edema. Patient has a new area over the left lateral foot above the malleolus that is small and appears to have no debris with intact surrounding skin. Patient is on increased dose of Lasix also as a means to edema management 3/25; the patient has a nonhealing venous ulcer on the medial left leg and last week developed a smaller area on the lateral left calf. We have been using Hydrofera Blue with a contact layer. 4/1; no major change in these wounds areas. Left medial and more recently left lateral calf. I  tried Iodoflex last week to aid in debridement she did not tolerate this. She stated her pain was terrible all week. She took the top layer of the 4 layer compression off. 4/8; the patient actually looks somewhat better in terms of her more prominent left lateral calf wound. There is some healthy looking tissue here. She is still complaining of a lot of discomfort. 4/15; patient in a lot of pain secondary to sciatica. She is on a prednisone taper prescribed by her primary physician. She has the 2 areas one on the left medial and more recently a smaller area on the left lateral calf. Both of these just above the malleoli 4/22; her back pain is better but she still states she is very uncomfortable and now feels she is intolerant to the [employer]. No real change in the wounds we have been using Sorbact. She has been previously intolerant to Iodoflex. There is not a lot of option about what we  can use to debride this wound under compression that she no doubt needs. sHe states Ultram no longer works for her pain 4/29; no major change in the wounds slightly increased depth. Surface on the original medial wound perhaps somewhat improved however the more recent area on the lateral left ankle is 100% covered in very adherent debris we have been using Sorbact. She tolerates 4 layer compression well and her edema control is a lot better. She has not had to come in for a nurse check 5/6; no major change in the condition of the wounds. She did consent to debridement today which was done with some difficulty. Continuing Sorbact. She did not tolerate Iodoflex. She was in for a check of her compression the day after we wrapped her last week this was adjusted but nothing much was found 5/13; no major change in the condition or area of the wounds. I was able to get a fairly aggressive debridement done on the lateral left leg wound. Even using Sorbact under compression. She came back in on Friday to have the wrap  changed. She says she felt uncomfortable on the lateral aspect of her ankle. She has a long history of chronic venous insufficiency including previous ablation surgery on this side. 5/20-Patient returns for wounds on left leg with both wounds covered in slough, with the lateral leg wound larger in size, she has been in 3 layer compression and felt more comfortable, she describes pain in ankle, in leg and pins and needles in foot, and is about to try Pamelor for this 6/3; wounds on the left lateral and left medial leg. The area medially which is the most recent of the 2 seems to have had the largest increase in dimensions. We have been using Sorbac to try and debride the surface. She has been to see orthopedics they apparently did a plain x-ray that was indeterminant. Diagnosed her with neuropathy and they have ordered an MRI to LAKETA, SANDOZ. ([id number]) determine if there is underlying osteomyelitis. This was not high on my thought list but I suppose it is prudent. We have advised her to make an appointment with vein and vascular in [place]. She has a history of a left greater saphenous and accessory vein ablations I wonder if there is anything else that can be done from a surgical point of view to help in these difficult refractory wounds. We have previously healed this wound on one occasion but it keeps on reopening [medial side] 6/10; deep tissue culture I did last week I think on the left medial wound showed both moderate E. coli and moderate staph aureus [MSSA]. She is going to require antibiotics and I have chosen Augmentin. We have been using Sorbact and we have made better looking wound surface on both sides but certainly no improvement in wound area. She was back in last Friday apparently for a dressing changes the wrap was hurting her outer left ankle. She has not managed to get a hold of vein and vascular in [place]. We are going to have to make her that appointment 6/17;  patient is tolerating the Augmentin. She had an MRI that I think was ordered by orthopedic surgeon this did not show osteomyelitis or an abscess did suggest cellulitis. We have been using Sorbact to the lateral and medial ankles. We have been trying to arrange a follow-up appointment with vein and vascular in [place] or did her original ablations. We apparently an area sent the request to vein and vascular  in [facility] 6/24; patient has completed the Augmentin. We do not yet have a vein and vascular appointment in [place]. I am not sure what the issue is here we have asked her to call tomorrow. We are using Sorbact. Making some improvements and especially the medial wound. Both surfaces however look better medial and lateral. 7/1; the patient has been in contact with vein and vascular in [place] but has not yet received an appointment. Using Sorbact we have gradually improve the wound surface with no improvement in surface area. She is approved for Apligraf but the wound surface still is not completely viable. She has not had to come in for a dressing change Electronic Signature(s) Signed: 05/06/2019 6:06:19 PM By: Linton Ham MD Entered By: Linton Ham on 05/06/2019 13:26:35 [name], [name] ([id number]) -------------------------------------------------------------------------------- Physical Exam Details Patient Name: VERNEDA, [name]. Date of Service: 05/06/2019 12:45 PM Medical Record Number: [id number] Patient Account Number: [id number] Date of Birth/Sex: 08-22-1946 (73 y.o. F) Treating RN: Cornell Barman Primary Care Provider: [name] Other Clinician: Referring Provider: [name] Treating Provider/Extender: Tito Dine in Treatment: 21 Constitutional Sitting or standing Blood Pressure is within target range for patient.. Pulse regular and within target range for patient.Marland Kitchen Respirations regular, non-labored and within target range.. Temperature is  normal and within the target range for the patient.Marland Kitchen appears in no distress. Cardiovascular She has palpable posterior tibial and dorsalis pedis pulses but certainly not vibrant. Edema control is good. Changes of chronic venous insufficiency. Notes Wound exam; the original wound on the medial leg and the more recent area on the lateral leg both require debridement with a #5 curette she tolerates this better. Removing adherent necrotic debris. I am able to get to a viable surface. 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 Date of Service: 05/06/2019 12:45 PM Medical Record Number: [id number] Patient Account Number: [id number] Date of Birth/Sex: 02-18-1946 (73 y.o. F) Treating RN: Cornell Barman Primary Care Provider: [name] Other Clinician: Referring Provider: [name] Treating Provider/Extender: Tito Dine in Treatment: 21 Verbal / Phone Orders: No Diagnosis Coding Wound Cleansing Wound #5 Left,Medial Lower Leg o Clean wound with Normal Saline. o May Shower, gently pat wound dry prior to applying new dressing. Wound #6 Left,Lateral Lower Leg o Clean wound with Normal Saline. o May Shower, gently pat wound dry prior to applying new dressing. Anesthetic (add to Medication List) Wound #5 Left,Medial Lower Leg o Topical Lidocaine 4% cream applied to wound bed prior to debridement (In Clinic Only). o Benzocaine Topical Anesthetic Spray applied to wound bed prior to debridement (In Clinic Only). Wound #6 Left,Lateral Lower Leg o Topical Lidocaine 4% cream applied to wound bed prior to debridement (In Clinic Only). o Benzocaine Topical Anesthetic Spray applied to wound bed  prior to debridement (In Clinic Only). Skin Barriers/Peri-Wound Care Wound #5 Left,Medial Lower Leg o Other: - Antibiotic ointment on irritated areas Wound #6 Left,Lateral Lower Leg o Other: - Antibiotic ointment on irritated areas Primary Wound Dressing Wound #5 Left,Medial Lower Leg o Other: - Sorbact Wound #6 Left,Lateral Lower Leg o Other: - Sorbact Secondary Dressing Wound #5 Left,Medial Lower Leg o ABD pad Wound #6 Left,Lateral Lower Leg o ABD pad Dressing Change Frequency Wound #5 Left,Medial Lower Leg o Change dressing every week TIYAH, ZELENAK ([id number]) Wound #6 Left,Lateral Lower  Leg o Change dressing every week Follow-up Appointments Wound #5 Left,Medial Lower Leg o Return Appointment in 1 week. o Nurse Visit as needed Wound #6 Left,Lateral Lower Leg o Return Appointment in 1 week. o Nurse Visit as needed Edema Control Wound #5 Left,Medial Lower Leg o 3 Layer Compression System - Left Lower Extremity Wound #6 Left,Lateral Lower Leg o 3 Layer Compression System - Left Lower Extremity Off-Loading Wound #5 Left,Medial Lower Leg o Other: - Elevate legs as needed Wound #6 Left,Lateral Lower Leg o Other: - Elevate legs as needed Electronic Signature(s) Signed: 05/06/2019 6:06:19 PM By: Linton Ham MD Signed: 05/11/2019 5:43:16 PM By: [name], [name], RN, CWS, [name], [name] Entered By: [name], [name], RN, CWS, Kim on 05/06/2019 13:02:26 JASIE, MELESKI ([id number]) -------------------------------------------------------------------------------- Problem List Details Patient Name: CITLALIC, NORLANDER. Date of Service: 05/06/2019 12:45 PM Medical Record Number: [id number] Patient Account Number: [id number] Date of Birth/Sex: 08-Aug-1946 (73 y.o. F) Treating RN: Cornell Barman Primary Care Provider: [name] Other Clinician: Referring Provider: [name] Treating Provider/Extender: Tito Dine in Treatment: 21 Active  Problems ICD-10 Evaluated Encounter Code Description Active Date Today Diagnosis L97.221 Non-pressure chronic ulcer of left calf limited to breakdown of 01/07/2019 No Yes skin I87.321 Chronic venous hypertension (idiopathic) with inflammation of 12/10/2018 No Yes right lower extremity I89.0 Lymphedema, not elsewhere classified 12/10/2018 No Yes Inactive Problems Resolved Problems Electronic Signature(s) Signed: 05/06/2019 6:06:19 PM By: Linton Ham MD Entered By: Linton Ham on 05/06/2019 13:21:25 [name] ([id number]) -------------------------------------------------------------------------------- Progress Note Details Patient Name: [name], [name]. Date of Service: 05/06/2019 12:45 PM Medical Record Number: [id number] Patient Account Number: [id number] Date of Birth/Sex: 1946/01/16 (73 y.o. F) Treating RN: Cornell Barman Primary Care Provider: [name] Other Clinician: Referring Provider: [name] Treating Provider/Extender: Tito Dine in Treatment: 21 Subjective History of Present Illness (HPI) Pleasant 73 year old with history of chronic venous insufficiency. No diabetes or peripheral vascular disease. Left ABI 1.29. Questionable history of left lower extremity DVT. She developed a recurrent ulceration on her left lateral calf in December 2015, which she attributes to poor diet and subsequent lower extremity edema. She underwent endovenous laser ablation of her left greater saphenous vein in 2010. She underwent laser ablation of accessory branch of left GSV in April 2016 by Dr. Kellie Simmering at [facility]. She was previously wearing Unna boots, which she tolerated well. Tolerating 2 layer compression and cadexomer iodine. She returns to clinic for follow-up and is without new complaints. She denies any significant pain at this time. She reports persistent pain with pressure. No claudication or ischemic rest pain. No fever or chills. No  drainage. READMISSION 11/13/16; this is a 73 year old woman who is not a diabetic. She is here for a review of a painful area on her left medial lower extremity. I note that she was seen here previously last year for wound I believe to be in the same area. At that time she had undergone previously a left greater saphenous vein ablation by Dr. Kellie Simmering and she had a ablation of the anterior accessory branch of the left greater saphenous vein in March 2016. Seeing that the wound actually closed over. In reviewing the history with her today the ulcer in this area has been recurrent. She describes a biopsy of this area in 2009 that only showed stasis physiology. 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 Do not see that she has ever had arterial studies in the left leg. She tells me she has a follow-up with Dr. Kellie Simmering in roughly 10 days In any case she developed the reopening of this area roughly a month ago. On the background of this she describes rapidly increasing edema which has responded to Lasix 40 mg and metolazone 2.5 mg as well as the patient's lymph massage. She has been told she has both venous insufficiency and lymphedema but she cannot tolerate compression stockings 11/28/16; the patient saw Dr. Kellie Simmering recently. Per the patient he did arterial Dopplers in the office that did not show evidence of arterial insufficiency, per the patient he stated "treat this like an ordinary venous ulcer". She also saw her dermatologist Dr. Ronnald Ramp who felt that this was more of a vascular ulcer. In general things are improving although she arrives today with increasing bilateral lower extremity edema with weeping a deeper fluid through the wound on the left medial leg compatible with some degree  of lymphedema 12/04/16; the patient's wound is fully epithelialized but I don't think fully healed. We will do another week of depression with Promogran and TCA however I suspect we'll be able to discharge her next week. This is a very unusual-looking wound which was initially a figure-of-eight type wound lying on its side surrounded by petechial like hemorrhage. She has had venous ablation on this side. She apparently does not have an arterial issue per Dr. Kellie Simmering. She saw her dermatologist thought it was "vascular". Patient is definitely going to need ongoing compression and I talked about this with her today she will go to elastic therapy after she leaves here next week 12/11/16; the patient's wound is not completely closed today. She has surrounding scar tissue and in further discussion with the patient it would appear that she had ulcers in this area in 2009 for a prolonged period of time ultimately requiring a punch biopsy of this area that only showed venous insufficiency. I did not previously pickup on this part of the history from the patient. 12/18/16; the patient's wound is completely epithelialized. There is no open area here. She has significant bilateral venous insufficiency with secondary lymphedema to a mild-to-moderate degree she does not have compression stockings.. She did not say anything to me when I was in the room, she told our intake nurse that she was still having pain in this area. This isn't unusual recurrent small open area. She is going to go to elastic therapy to obtain compression stockings. 12/25/16; the patient's wound is fully epithelialized. There is no open area here. The patient describes some continued episodic discomfort in this area medial left calf. However everything looks fine and healed here. She is been to elastic therapy and PEARLA, MCKINNY ([id number]) caught herself 15-20 mmHg stockings, they apparently were having trouble getting 20-30 mm stockings in her  size 01/22/17; this is a patient we discharged from the clinic a month ago. She has a recurrent open wound on her medial left calf. She had 15 mm support stockings. I told her I thought she needed 20-30 mm compression stockings. She tells me that she has been ill with hospitalization secondary to asthma and is been found to have severe hypokalemia likely secondary to a combination of Lasix and metolazone. This morning she noted blistering and leaking fluid on the posterior part of her left  leg. She called our intake nurse urgently and we was saw her this afternoon. She has not had any real discomfort here. I don't know that she's been wearing any stockings on this leg for at least 2-3 days. ABIs in this clinic were 1.21 on the right and 1.3 on the left. She is previously seen vascular surgery who does not think that there is a peripheral arterial issue. 01/30/17; Patient arrives with no open wound on the left leg. She has been to elastic therapy and obtained 20-13mmhg below knee stockings and she has one on the right leg today. READMISSION 02/19/18; this Maina is a now 73 year old patient we've had in this clinic perhaps 3 times before. I had last looked at her from January 07 December 2016 with an area on the medial left leg. We discharged her on 12/25/16 however she had to be readmitted on 01/22/17 with a recurrence. I have in my notes that we discharged her on 20-30 mm stockings although she tells me she was only wearing support hose because she cannot get stockings on predominantly related to her cervical spine surgery/issues. She has had previous ablations done by vein and vascular in [place] including a great saphenous vein ablation on the left with an anterior accessory branch ablation I think both of these were in 2016. On one of the previous visit she had a biopsy noted 2009 that was negative. She is not felt to have an arterial issue. She is not a diabetic. She does have a history of  obstructive sleep apnea hypertension asthma as well as chronic venous insufficiency and lymphedema. On this occasion she noted 2 dry scaly patch on her left leg. She tried to put lotion on this it didn't really help. There were 2 open areas.the patient has been seeing her primary physician from 02/05/18 through 02/14/18. She had Unna boots applied. The superior wound now on the lateral left leg has closed but she's had one wound that remains open on the lateral left leg. This is not the same spot as we dealt with in 2018. ABIs in this clinic were 1.3 bilaterally 02/26/18; patient has a small wound on the left lateral calf. Dimensions are down. She has chronic venous insufficiency and lymphedema. 03/05/18; small open area on the left lateral calf. Dimensions are down. Tightly adherent necrotic debris over the surface of the wound which was difficult to remove. Also the dressing [over collagen] stuck to the wound surface. This was removed with some difficulty as well. Change the primary dressing to Hydrofera Blue ready 03/12/18; small open area on the left lateral calf. Comes in with tightly adherent surface eschar as well as some adherent Hydrofera Blue. 03/19/18; open area on the left lateral calf. Again adherent surface eschar as well as some adherent Hydrofera Blue nonviable subcutaneous tissue. She complained of pain all week even with the reduction from 4-3 layer compression I put on last week. Also she had an increase in her ankle and calf measurements probably related to the same thing. 03/26/18; open area on the left lateral calf. A very small open area remains here. We used silver alginate starting last week as the Hydrofera Blue seem to stick to the wound bed. In using 4-layer compression 04/02/18; the open area in the left lateral calf at some adherent slough which I removed there is no open area here. We are able to transition her into her own compression stocking. Truthfully I think this is  probably his support hose. However this  does not maintain skin integrity will be limited. She cannot put over the toe compression stockings on because of neck problems hand problems etc. She is allergic to the lining layer of juxta lites. We might be forced to use extremitease stocking should this fail READMIT 11/24/2018 Patient is now a 73 year old woman who is not a diabetic. She has been in this clinic on at least 3 previous occasions largely with recurrent wounds on her left leg secondary to chronic venous insufficiency with secondary lymphedema. Her situation is complicated by inability to get stockings on and an allergy to neoprene which is apparently a component and at least juxta lites and other stockings. As a result she really has not been wearing any stockings on her legs. She tells [place] that roughly 2 or 3 weeks ago she started noticing a stinging sensation just above her ankle on the left medial aspect. She has been diagnosed with pseudogout and she wondered whether this was what she was experiencing. She tried to dress this with something she bought at the store however subsequently it pulled skin off and now she has an open wound that is not improving. She has been using Vaseline gauze with a cover bandage. She saw her primary doctor last week who put an Haematologist on her. ABIs in this clinic was 1.03 on the left DONNAJEAN, CHESNUT. ([id number]) 2/12; the area is on the left medial ankle. Odd-looking wound with what looks to be surface epithelialization but a multitude of small petechial openings. This clearly not closed yet. We have been using silver alginate under 3 layer compression with TCA 2/19; the wound area did not look quite as good this week. Necrotic debris over the majority of the wound surface which required debridement. She continues to have a multitude of what looked to be small petechial openings. She reminds [place] that she had a biopsy on this initially during her first  outbreak in 2015 in [place] dermatology. She expresses concern about this being a possible melanoma. 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 I assured her that this does not look anything like melanoma. Except for the petechial reaction it does look like a venous insufficiency area and she certainly has evidence of this on both sides 2/26; a difficult area on the left medial ankle. The patient clearly has chronic venous hypertension with some degree of lymphedema. The odd thing about the area is the small petechial hemorrhages. I am not really sure how to explain this. This was present last time and this is not a compression injury. We have been using Hydrofera Blue which I changed to last week 3/4; still using Hydrofera Blue. Aggressive debridement today. She does not have known arterial issues. She has seen Dr. Kellie Simmering at [facility] vein and vascular and and has an ablation on the left. [Anterior accessory branch of the greater saphenous]. From what I remember they did not feel she had an arterial issue. The patient has had this area biopsied in 2009 at [facility] dermatology and by her recollection they said this was "stasis". She is also follow-up with dermatology locally who thought that this was more of a vascular issue 3/11; using Hydrofera Blue. Aggressive debridement today. She does not have an arterial issue. We are using 3 layer compression although we may need to go to 4. The patient has been in for multiple changes to her wrap since I last saw her  a week ago. She says that the area was leaking. I do not have too much more information on what was found 01/19/19 on evaluation today patient was actually being seen for a nurse visit when unfortunately she had the area on her left lateral lower extremity as well as weeping from the right lower extremity that became apparent. Therefore we did end  up actually seeing her for a full visit with myself. She is having some pain at this site as well but fortunately nothing too significant at this point. No fevers, chills, nausea, or vomiting noted at this time. 3/18-Patient is back to the clinic with the left leg venous leg ulcer, the ulcer is larger in size, has a surface that is densely adherent with fibrinous tissue, the Hydrofera Blue was used but is densely adherent and there was difficulty in removing it. The right lower extremity was also wrapped for weeping edema. Patient has a new area over the left lateral foot above the malleolus that is small and appears to have no debris with intact surrounding skin. Patient is on increased dose of Lasix also as a means to edema management 3/25; the patient has a nonhealing venous ulcer on the medial left leg and last week developed a smaller area on the lateral left calf. We have been using Hydrofera Blue with a contact layer. 4/1; no major change in these wounds areas. Left medial and more recently left lateral calf. I tried Iodoflex last week to aid in debridement she did not tolerate this. She stated her pain was terrible all week. She took the top layer of the 4 layer compression off. 4/8; the patient actually looks somewhat better in terms of her more prominent left lateral calf wound. There is some healthy looking tissue here. She is still complaining of a lot of discomfort. 4/15; patient in a lot of pain secondary to sciatica. She is on a prednisone taper prescribed by her primary physician. She has the 2 areas one on the left medial and more recently a smaller area on the left lateral calf. Both of these just above the malleoli 4/22; her back pain is better but she still states she is very uncomfortable and now feels she is intolerant to the [employer]. No real change in the wounds we have been using Sorbact. She has been previously intolerant to Iodoflex. There is not a lot of option about  what we can use to debride this wound under compression that she no doubt needs. sHe states Ultram no longer works for her pain 4/29; no major change in the wounds slightly increased depth. Surface on the original medial wound perhaps somewhat improved however the more recent area on the lateral left ankle is 100% covered in very adherent debris we have been using Sorbact. She tolerates 4 layer compression well and her edema control is a lot better. She has not had to come in for a nurse check 5/6; no major change in the condition of the wounds. She did consent to debridement today which was done with some difficulty. Continuing Sorbact. She did not tolerate Iodoflex. She was in for a check of her compression the day after we wrapped her last week this was adjusted but nothing much was found 5/13; no major change in the condition or area of the wounds. I was able to get a fairly aggressive debridement done on the lateral left leg wound. Even using Sorbact under compression. She came back in on Friday to have the  wrap changed. She says she felt uncomfortable on the lateral aspect of her ankle. She has a long history of chronic venous insufficiency including previous ablation surgery on this side. 5/20-Patient returns for wounds on left leg with both wounds covered in slough, with the lateral leg wound larger in size, she has been in 3 layer compression and felt more comfortable, she describes pain in ankle, in leg and pins and needles in foot, and is about to try Pamelor for this 6/3; wounds on the left lateral and left medial leg. The area medially which is the most recent of the 2 seems to have had the largest increase in dimensions. We have been using Sorbac to try and debride the surface. She has been to see orthopedics [name], [name] ([id number]) they apparently did a plain x-ray that was indeterminant. Diagnosed her with neuropathy and they have ordered an MRI to determine if there is  underlying osteomyelitis. This was not high on my thought list but I suppose it is prudent. We have advised her to make an appointment with vein and vascular in [place]. She has a history of a left greater saphenous and accessory vein ablations I wonder if there is anything else that can be done from a surgical point of view to help in these difficult refractory wounds. We have previously healed this wound on one occasion but it keeps on reopening [medial side] 6/10; deep tissue culture I did last week I think on the left medial wound showed both moderate E. coli and moderate staph aureus [MSSA]. She is going to require antibiotics and I have chosen Augmentin. We have been using Sorbact and we have made better looking wound surface on both sides but certainly no improvement in wound area. She was back in last Friday apparently for a dressing changes the wrap was hurting her outer left ankle. She has not managed to get a hold of vein and vascular in [place]. We are going to have to make her that appointment 6/17; patient is tolerating the Augmentin. She had an MRI that I think was ordered by orthopedic surgeon this did not show osteomyelitis or an abscess did suggest cellulitis. We have been using Sorbact to the lateral and medial ankles. We have been trying to arrange a follow-up appointment with vein and vascular in [place] or did her original ablations. We apparently an area sent the request to vein and vascular in [facility] 6/24; patient has completed the Augmentin. We do not yet have a vein and vascular appointment in [place]. I am not sure what the issue is here we have asked her to call tomorrow. We are using Sorbact. Making some improvements and especially the medial wound. Both surfaces however look better medial and lateral. 7/1; the patient has been in contact with vein and vascular in [place] but has not yet received an appointment. Using Sorbact we have gradually improve  the wound surface with no improvement in surface area. She is approved for Apligraf but the wound surface still is not completely viable. She has not had to come in for a dressing change Objective Constitutional Sitting or standing Blood Pressure is within target range for patient.. Pulse regular and within target range for patient.Marland Kitchen Respirations regular, non-labored and within target range.. Temperature is normal and within the target range for the patient.Marland Kitchen appears in no distress. Vitals Time Taken: 12:40 PM, Height: 63 in, Weight: 224.7 lbs, BMI: 39.8, Temperature: 98.2 F, Pulse: 90 bpm, Respiratory Rate: 18 breaths/min, Blood Pressure:  133/81 mmHg. Cardiovascular She has palpable posterior tibial and dorsalis pedis pulses but certainly not vibrant. Edema control is good. Changes of chronic venous insufficiency. General Notes: Wound exam; the original wound on the medial leg and the more recent area on the lateral leg both require debridement with a #5 curette she tolerates this better. Removing adherent necrotic debris. I am able to get to a viable surface. Her edema control is good Integumentary (Hair, Skin) Wound #5 status is Open. Original cause of wound was Gradually Appeared. The wound is located on the Left,Medial Lower Leg. The wound measures 2.5cm length x 2.5cm width x 0.3cm depth; 4.909cm^2 area and 1.473cm^3 volume. There is Fat Layer (Subcutaneous Tissue) Exposed exposed. There is no tunneling or undermining noted. There is a medium amount of purulent drainage noted. The wound margin is indistinct and nonvisible. There is small (1-33%) pink granulation within the wound bed. There is a large (67-100%) amount of necrotic tissue within the wound bed including Adherent Slough. Wound #6 status is Open. Original cause of wound was Gradually Appeared. The wound is located on the Left,Lateral Lower Leg. The wound measures 3.1cm length x 3.5cm width x 0.4cm depth; 8.522cm^2 area and  3.409cm^3 volume. There is Fat [name], [name]. ([id number]) Layer (Subcutaneous Tissue) Exposed exposed. There is no tunneling or undermining noted. There is a medium amount of purulent drainage noted. The wound margin is indistinct and nonvisible. There is small (1-33%) pink granulation within the wound bed. There is a large (67-100%) amount of necrotic tissue within the wound bed including Adherent Slough. Assessment Active Problems ICD-10 Non-pressure chronic ulcer of left calf limited to breakdown of skin Chronic venous hypertension (idiopathic) with inflammation of right lower extremity Lymphedema, not elsewhere classified Procedures Wound #5 Pre-procedure diagnosis of Wound #5 is a Lymphedema located on the Left,Medial Lower Leg . There was a Excisional Skin/Subcutaneous Tissue Debridement with a total area of 6.25 sq cm performed by [name] Dillon, MD. With the following instrument(s): Curette to remove Viable and Non-Viable tissue/material. Material removed includes Subcutaneous Tissue and Slough and after achieving pain control using Lidocaine. No specimens were taken. A time out was conducted at 12:57, prior to the start of the procedure. A Minimum amount of bleeding was controlled with Pressure. The procedure was tolerated well. Post Debridement Measurements: 2.5cm length x 2.5cm width x 0.4cm depth; 1.963cm^3 volume. Character of Wound/Ulcer Post Debridement is stable. Post procedure Diagnosis Wound #5: Same as Pre-Procedure Wound #6 Pre-procedure diagnosis of Wound #6 is a Venous Leg Ulcer located on the Left,Lateral Lower Leg .Severity of Tissue Pre Debridement is: Fat layer exposed. There was a Excisional Skin/Subcutaneous Tissue Debridement with a total area of 10.85 sq cm performed by [name] Dillon, MD. With the following instrument(s): Curette to remove Viable and Non-Viable tissue/material. Material removed includes Subcutaneous Tissue and Slough and after achieving  pain control using Lidocaine. No specimens were taken. A time out was conducted at 12:57, prior to the start of the procedure. A Minimum amount of bleeding was controlled with Pressure. The procedure was tolerated well. Post Debridement Measurements: 3.1cm length x 3.4cm width x 0.4cm depth; 3.311cm^3 volume. Character of Wound/Ulcer Post Debridement is stable. Severity of Tissue Post Debridement is: Fat layer exposed. Post procedure Diagnosis Wound #6: Same as Pre-Procedure Plan Wound Cleansing: Wound #5 Left,Medial Lower Leg: Clean wound with Normal Saline. May Shower, gently pat wound dry prior to applying new dressing. Wound #6 Left,Lateral Lower Leg: Clean wound with Normal Saline.  TALAYIA, HJORT ([id number]) May Shower, gently pat wound dry prior to applying new dressing. Anesthetic (add to Medication List): Wound #5 Left,Medial Lower Leg: Topical Lidocaine 4% cream applied to wound bed prior to debridement (In Clinic Only). Benzocaine Topical Anesthetic Spray applied to wound bed prior to debridement (In Clinic Only). Wound #6 Left,Lateral Lower Leg: Topical Lidocaine 4% cream applied to wound bed prior to debridement (In Clinic Only). Benzocaine Topical Anesthetic Spray applied to wound bed prior to debridement (In Clinic Only). Skin Barriers/Peri-Wound Care: Wound #5 Left,Medial Lower Leg: Other: - Antibiotic ointment on irritated areas Wound #6 Left,Lateral Lower Leg: Other: - Antibiotic ointment on irritated areas Primary Wound Dressing: Wound #5 Left,Medial Lower Leg: Other: - Sorbact Wound #6 Left,Lateral Lower Leg: Other: - Sorbact Secondary Dressing: Wound #5 Left,Medial Lower Leg: ABD pad Wound #6 Left,Lateral Lower Leg: ABD pad Dressing Change Frequency: Wound #5 Left,Medial Lower Leg: Change dressing every week Wound #6 Left,Lateral Lower Leg: Change dressing every week Follow-up Appointments: Wound #5 Left,Medial Lower Leg: Return Appointment in 1  week. Nurse Visit as needed Wound #6 Left,Lateral Lower Leg: Return Appointment in 1 week. Nurse Visit as needed Edema Control: Wound #5 Left,Medial Lower Leg: 3 Layer Compression System - Left Lower Extremity Wound #6 Left,Lateral Lower Leg: 3 Layer Compression System - Left Lower Extremity Off-Loading: Wound #5 Left,Medial Lower Leg: Other: - Elevate legs as needed Wound #6 Left,Lateral Lower Leg: Other: - Elevate legs as needed 1. I am continuing with the Sorbact. 2. I would like her to have an appointment with vein and vascular. She has recurrent wounds especially on the right leg in the setting of chronic venous insufficiency and previous procedures done by vein and vascular. I was hoping they could review this and determine whether anything else could be done to help prevent recurrent wounds in this patient 3. She is close to a point that I think I could successfully use [employer] Electronic Signature(s) Signed: 05/06/2019 6:06:19 PM By: Linton Ham MD JAQUILA, [name] ([id number]) Entered By: Linton Ham on 05/06/2019 13:29:48 [name], [name] ([id number]) -------------------------------------------------------------------------------- SuperBill Details Patient Name: KATHYJO, BRIERE. Date of Service: 05/06/2019 Medical Record Number: [id number] Patient Account Number: [id number] Date of Birth/Sex: 11-23-1945 (73 y.o. F) Treating RN: Cornell Barman Primary Care Provider: [name] Other Clinician: Referring Provider: [name] Treating Provider/Extender: Tito Dine in Treatment: 21 Diagnosis Coding ICD-10 Codes Code Description [phone] Non-pressure chronic ulcer of left calf limited to breakdown of skin I87.321 Chronic venous hypertension (idiopathic) with inflammation of right lower extremity I89.0 Lymphedema, not elsewhere classified Facility Procedures CPT4 Code: [id number] Description: 72094 - DEB SUBQ TISSUE 20 SQ CM/< ICD-10 Diagnosis  Description L97.221 Non-pressure chronic ulcer of left calf limited to breakdown Modifier: of skin Quantity: 1 Physician Procedures CPT4 Code: 7096283 Description: 66294 - WC PHYS SUBQ TISS 20 SQ CM ICD-10 Diagnosis Description L97.221 Non-pressure chronic ulcer of left calf limited to breakdown Modifier: of skin Quantity: 1 Electronic Signature(s) Signed: 05/06/2019 6:06:19 PM By: Linton Ham MD Entered By: Linton Ham on 05/06/2019 13:30:09

## 2019-05-13 ENCOUNTER — Other Ambulatory Visit: Payer: Self-pay

## 2019-05-13 ENCOUNTER — Encounter: Payer: Medicare Other | Admitting: Internal Medicine

## 2019-05-13 DIAGNOSIS — I87312 Chronic venous hypertension (idiopathic) with ulcer of left lower extremity: Secondary | ICD-10-CM | POA: Diagnosis not present

## 2019-05-13 DIAGNOSIS — L97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d: Secondary | ICD-10-CM | POA: Diagnosis not present

## 2019-05-13 DIAGNOSIS — L97222 Non-pressure chronic ulcer of left calf with fat layer exposed: Secondary | ICD-10-CM | POA: Diagnosis not present

## 2019-05-13 DIAGNOSIS — L97221 Non-pressure chronic ulcer of left calf limited to breakdown of skin: Secondary | ICD-10-CM | POA: Diagnosis not present

## 2019-05-13 DIAGNOSIS — I890 Lymphedema, not elsewhere classified: Secondary | ICD-10-CM | POA: Diagnosis not present

## 2019-05-13 DIAGNOSIS — I87332 Chronic venous hypertension (idiopathic) with ulcer and inflammation of left lower extremity: Secondary | ICD-10-CM | POA: Diagnosis not present

## 2019-05-14 NOTE — Progress Notes (Signed)
Amber, [name] ([id number]) Visit Report for 05/13/2019 Arrival Information Details Patient Name: [name], [name]. Date of Service: 05/13/2019 12:45 PM Medical Record Number: [id number] Patient Account Number: [id number] Date of Birth/Sex: 1946/11/05 (73 y.o. F) Treating RN: Harold Barban Primary Care Mirna Sutcliffe: [name] Other Clinician: Referring Iness Pangilinan: [name] Treating Calel Pisarski/Extender: Tito Dine in Treatment: 20 Visit Information History Since Last Visit Added or deleted any medications: No Patient Arrived: Ambulatory Any new allergies or adverse reactions: No Arrival Time: 12:38 Had a fall or experienced change in No Accompanied By: self activities of daily living that may affect Transfer Assistance: None risk of falls: Patient Identification Verified: Yes Signs or symptoms of abuse/neglect since last visito No Secondary Verification Process Completed: Yes Hospitalized since last visit: No Has Dressing in Place as Prescribed: Yes Has Compression in Place as Prescribed: Yes Pain Present Now: Yes Electronic Signature(s) Signed: 05/13/2019 4:22:05 PM By: Harold Barban Entered By: Harold Barban on 05/13/2019 12:38:47 Knight, [name] ([id number]) -------------------------------------------------------------------------------- Encounter Discharge Information Details Patient Name: Amber, [name]. Date of Service: 05/13/2019 12:45 PM Medical Record Number: [id number] Patient Account Number: [id number] Date of Birth/Sex: 1946/08/31 (73 y.o. F) Treating RN: Army Melia Primary Care Amaiyah Nordhoff: [name] Other Clinician: Referring Nakaya Mishkin: [name] Treating Ajah Vanhoose/Extender: Tito Dine in Treatment: 22 Encounter Discharge Information Items Post Procedure Vitals Discharge Condition: Stable Temperature (F): 98.3 Ambulatory Status: Ambulatory Pulse (bpm): 90 Discharge Destination: Home Respiratory Rate  (breaths/min): 16 Transportation: Other Blood Pressure (mmHg): 140/66 Accompanied By: self Schedule Follow-up Appointment: Yes Clinical Summary of Care: Electronic Signature(s) Signed: 05/14/2019 2:48:20 PM By: Army Melia Entered By: Army Melia on 05/13/2019 13:26:23 Helfman, [name] ([id number]) -------------------------------------------------------------------------------- Lower Extremity Assessment Details Patient Name: Amber, [name]. Date of Service: 05/13/2019 12:45 PM Medical Record Number: [id number] Patient Account Number: [id number] Date of Birth/Sex: 06-18-1946 (73 y.o. F) Treating RN: Harold Barban Primary Care Marriana Hibberd: [name] Other Clinician: Referring Jasmane Brockway: [name] Treating Graycie Halley/Extender: Tito Dine in Treatment: 22 Edema Assessment Assessed: [Left: No] [Right: No] [Left: Edema] [Right: :] Calf Left: Right: Point of Measurement: 31 cm From Medial Instep 42.5 cm cm Ankle Left: Right: Point of Measurement: 12 cm From Medial Instep 23 cm cm Vascular Assessment Pulses: Dorsalis Pedis Palpable: [Left:Yes] Posterior Tibial Palpable: [Left:Yes] Electronic Signature(s) Signed: 05/13/2019 4:22:05 PM By: Harold Barban Entered By: Harold Barban on 05/13/2019 12:52:16 Tickner, [name] ([id number]) -------------------------------------------------------------------------------- Multi Wound Chart Details Patient Name: Amber, [name]. Date of Service: 05/13/2019 12:45 PM Medical Record Number: [id number] Patient Account Number: [id number] Date of Birth/Sex: 1946-06-12 (73 y.o. F) Treating RN: Cornell Barman Primary Care Alontae Chaloux: [name] Other Clinician: Referring [place] Searles: [name] Treating Zylen Wenig/Extender: Tito Dine in Treatment: 22 Vital Signs Height(in): 63 Pulse(bpm): 90 Weight(lbs): 224.7 Blood Pressure(mmHg): 140/66 Body Mass Index(BMI): 40 Temperature(F): 98.3 Respiratory  Rate 18 (breaths/min): Photos: [N/A:N/A] Wound Location: Left, Medial Lower Leg Left Lower Leg - Lateral N/A Wounding Event: Gradually Appeared Gradually Appeared N/A Primary Etiology: Lymphedema Venous Leg Ulcer N/A Comorbid History: Cataracts, Asthma, Sleep Cataracts, Asthma, Sleep N/A Apnea, Deep Vein Apnea, Deep Vein Thrombosis, Hypertension, Thrombosis, Hypertension, Peripheral Venous Disease, Peripheral Venous Disease, Osteoarthritis, Received Osteoarthritis, Received Chemotherapy, Received Chemotherapy, Received Radiation Radiation Date Acquired: 11/19/2018 01/19/2019 N/A Weeks of Treatment: 22 16 N/A Wound Status: Open Open N/A Measurements L x W x D 3.3x3.5x0.2 3x3.5x0.4 N/A (cm) Area (cm) : 9.071 8.247 N/A Volume (cm) : 1.814 3.299 N/A % Reduction in Area: -6.20% -3648.60% N/A %  Reduction in Volume: -112.20% -14895.50% N/A Classification: Full Thickness Without Full Thickness Without N/A Exposed Support Structures Exposed Support Structures Exudate Amount: Medium Medium N/A Exudate Type: Purulent Purulent N/A Exudate Color: yellow, brown, green yellow, brown, green N/A Wound Margin: Indistinct, nonvisible Indistinct, nonvisible N/A Granulation Amount: Small (1-33%) Small (1-33%) N/A Granulation Quality: Pink Pink N/A Necrotic Amount: Large (67-100%) Large (67-100%) N/A Exposed Structures: N/A TASHIMA, SCARPULLA ([id number]) Fat Layer (Subcutaneous Fat Layer (Subcutaneous Tissue) Exposed: Yes Tissue) Exposed: Yes Fascia: No Fascia: No Tendon: No Tendon: No Muscle: No Muscle: No Joint: No Joint: No Bone: No Bone: No Epithelialization: None None N/A Debridement: Debridement - Excisional Debridement - Excisional N/A Pre-procedure 13:03 13:03 N/A Verification/Time Out Taken: Pain Control: Lidocaine Lidocaine N/A Tissue Debrided: Subcutaneous, Slough Subcutaneous, Slough N/A Level: Skin/Subcutaneous Tissue Skin/Subcutaneous Tissue N/A Debridement Area (sq cm):  11.55 10.5 N/A Instrument: Curette Curette N/A Bleeding: Moderate Moderate N/A Hemostasis Achieved: Pressure Pressure N/A Debridement Treatment Procedure was tolerated well Procedure was tolerated well N/A Response: Post Debridement 3.3x3.5x0.3 3x3.5x0.4 N/A Measurements L x W x D (cm) Post Debridement Volume: 2.721 3.299 N/A (cm) Procedures Performed: Debridement Debridement N/A Treatment Notes Electronic Signature(s) Signed: 05/13/2019 5:45:20 PM By: Linton Ham MD Entered By: Linton Ham on 05/13/2019 13:11:50 Lanius, [name] ([id number]) -------------------------------------------------------------------------------- Multi-Disciplinary Care Plan Details Patient Name: SONDOS, WOLFMAN. Date of Service: 05/13/2019 12:45 PM Medical Record Number: [id number] Patient Account Number: [id number] Date of Birth/Sex: 03-May-1946 (73 y.o. F) Treating RN: Cornell Barman Primary Care Neale Marzette: [name] Other Clinician: Referring Mikal Blasdell: [name] Treating Emaan Gary/Extender: Tito Dine in Treatment: 22 Active Inactive Orientation to the Wound Care Program Nursing Diagnoses: Knowledge deficit related to the wound healing center program Goals: Patient/caregiver will verbalize understanding of the [facility] Date Initiated: 12/10/2018 Target Resolution Date: 01/09/2019 Goal Status: Active Interventions: Provide education on orientation to the wound center Notes: Soft Tissue Infection Nursing Diagnoses: Impaired tissue integrity Goals: Patient's soft tissue infection will resolve Date Initiated: 12/10/2018 Target Resolution Date: 01/09/2019 Goal Status: Active Interventions: Assess signs and symptoms of infection every visit Notes: Venous Leg Ulcer Nursing Diagnoses: Actual venous Insuffiency (use after diagnosis is confirmed) Goals: Patient will maintain optimal edema control Date Initiated: 12/10/2018 Target Resolution Date:  01/09/2019 Goal Status: Active Interventions: Assess peripheral edema status every visit. FLOREEN, TEEGARDEN ([id number]) Treatment Activities: Therapeutic compression applied : 12/10/2018 Notes: Wound/Skin Impairment Nursing Diagnoses: Impaired tissue integrity Goals: Patient/caregiver will verbalize understanding of skin care regimen Date Initiated: 12/10/2018 Target Resolution Date: 01/09/2019 Goal Status: Active Interventions: Assess ulceration(s) every visit Treatment Activities: Topical wound management initiated : 12/10/2018 Notes: Electronic Signature(s) Signed: 05/13/2019 5:54:22 PM By: [name], [name], RN, CWS, [name], [name] Entered By: [name], [name], RN, CWS, Kim on 05/13/2019 13:05:55 [name] ([id number]) -------------------------------------------------------------------------------- Pain Assessment Details Patient Name: MYKELTI, GOLDENSTEIN. Date of Service: 05/13/2019 12:45 PM Medical Record Number: [id number] Patient Account Number: [id number] Date of Birth/Sex: 01-[name]-1946 (73 y.o. F) Treating RN: Harold Barban Primary Care Lamarr Feenstra: [name] Other Clinician: Referring Juni Glaab: [name] Treating Marlana Mckowen/Extender: Tito Dine in Treatment: 22 Active Problems Location of Pain Severity and Description of Pain Patient Has Paino No Site Locations Pain Management and Medication Current Pain Management: Electronic Signature(s) Signed: 05/13/2019 4:22:05 PM By: Harold Barban Entered By: Harold Barban on 05/13/2019 12:39:17 Chipman, [name] ([id number]) -------------------------------------------------------------------------------- Patient/Caregiver Education Details Patient Name: SYRIA, KESTNER. Date of Service: 05/13/2019 12:45 PM Medical Record Number: [id number] Patient Account Number: [id number] Date  of Birth/Gender: Oct 22, 1946 (73 y.o. F) Treating RN: Cornell Barman Primary Care Physician: [name] Other Clinician: Referring Physician:  [name] Treating Physician/Extender: Tito Dine in Treatment: 22 Education Assessment Education Provided To: Patient Education Topics Provided Wound/Skin Impairment: Handouts: Caring for Your Ulcer, Other: continue wound care as prescribed Methods: Demonstration, Explain/Verbal Responses: State content correctly Electronic Signature(s) Signed: 05/13/2019 5:54:22 PM By: [name], [name], RN, CWS, [name], [name] Entered By: [name], [name], RN, CWS, Kim on 05/13/2019 13:10:43 [name] ([id number]) -------------------------------------------------------------------------------- Wound Assessment Details Patient Name: ISLA, SABREE. Date of Service: 05/13/2019 12:45 PM Medical Record Number: [id number] Patient Account Number: [id number] Date of Birth/Sex: [birth year] (73 y.o. F) Treating RN: Cornell Barman Primary Care Armonee Bojanowski: [name] Other Clinician: Referring Michaeline Eckersley: [name] Treating Arianny Pun/Extender: Tito Dine in Treatment: 22 Wound Status Wound Number: 5 Primary Lymphedema Etiology: Wound Location: Left, Medial Lower Leg Wound Open Wounding Event: Gradually Appeared Status: Date Acquired: 11/19/2018 Comorbid Cataracts, Asthma, Sleep Apnea, Deep Vein Weeks Of Treatment: 22 History: Thrombosis, Hypertension, Peripheral Venous Clustered Wound: No Disease, Osteoarthritis, Received Chemotherapy, Received Radiation Photos Wound Measurements Length: (cm) 3.3 Width: (cm) 3.5 Depth: (cm) 0.2 Area: (cm) 9.071 Volume: (cm) 1.814 % Reduction in Area: -6.2% % Reduction in Volume: -112.2% Epithelialization: None Tunneling: No Undermining: No Wound Description Full Thickness Without Exposed Support Classification: Structures Wound Margin: Indistinct, nonvisible Exudate Medium Amount: Exudate Type: Purulent Exudate Color: yellow, brown, green Foul Odor After Cleansing: No Slough/Fibrino Yes Wound Bed Granulation Amount:  Small (1-33%) Exposed Structure Granulation Quality: Pink Fascia Exposed: No Necrotic Amount: Large (67-100%) Fat Layer (Subcutaneous Tissue) Exposed: Yes Necrotic Quality: Adherent Slough Tendon Exposed: No Muscle Exposed: No Joint Exposed: No Bone Exposed: No CAMAY, PEDIGO ([id number]) Treatment Notes Wound #5 (Left, Medial Lower Leg) Notes sorbact, xtrasorb, 3 layer Electronic Signature(s) Signed: 05/13/2019 5:54:22 PM By: [name], [name], RN, CWS, [name], [name] Entered By: [name], [name], RN, CWS, Kim on 05/13/2019 13:05:13 [name] ([id number]) -------------------------------------------------------------------------------- Wound Assessment Details Patient Name: CARISSA, MUSICK. Date of Service: 05/13/2019 12:45 PM Medical Record Number: [id number] Patient Account Number: [id number] Date of Birth/Sex: 12/15/1945 (73 y.o. F) Treating RN: Harold Barban Primary Care Trinna Kunst: [name] Other Clinician: Referring Nili Honda: [name] Treating Bobby Ragan/Extender: Tito Dine in Treatment: 22 Wound Status Wound Number: 6 Primary Venous Leg Ulcer Etiology: Wound Location: Left Lower Leg - Lateral Wound Open Wounding Event: Gradually Appeared Status: Date Acquired: 01/19/2019 Comorbid Cataracts, Asthma, Sleep Apnea, Deep Vein Weeks Of Treatment: 16 History: Thrombosis, Hypertension, Peripheral Venous Clustered Wound: No Disease, Osteoarthritis, Received Chemotherapy, Received Radiation Photos Wound Measurements Length: (cm) 3 Width: (cm) 3.5 Depth: (cm) 0.4 Area: (cm) 8.247 Volume: (cm) 3.299 % Reduction in Area: -3648.6% % Reduction in Volume: -14895.5% Epithelialization: None Tunneling: No Undermining: No Wound Description Full Thickness Without Exposed Support Classification: Structures Wound Margin: Indistinct, nonvisible Exudate Medium Amount: Exudate Type: Purulent Exudate Color: yellow, brown, green Foul Odor After Cleansing:  No Slough/Fibrino Yes Wound Bed Granulation Amount: Small (1-33%) Exposed Structure Granulation Quality: Pink Fascia Exposed: No Necrotic Amount: Large (67-100%) Fat Layer (Subcutaneous Tissue) Exposed: Yes Necrotic Quality: Adherent Slough Tendon Exposed: No Muscle Exposed: No Joint Exposed: No Bone Exposed: No Render, Sydny J. ([id number]) Treatment Notes Wound #6 (Left, Lateral Lower Leg) Notes sorbact, xtrasorb, 3 layer Electronic Signature(s) Signed: 05/13/2019 4:22:05 PM By: Harold Barban Entered By: Harold Barban on 05/13/2019 12:56:18 Yarberry, [name] ([id number]) -------------------------------------------------------------------------------- Vitals Details Patient Name: SONG, MYRE. Date  of Service: 05/13/2019 12:45 PM Medical Record Number: [id number] Patient Account Number: [id number] Date of Birth/Sex: Mar 05, 1946 (72 y.o. F) Treating RN: Harold Barban Primary Care Hernan Turnage: [name] Other Clinician: Referring Romaine Maciolek: [name] Treating Selicia Windom/Extender: Tito Dine in Treatment: 22 Vital Signs Time Taken: 12:38 Temperature (F): 98.3 Height (in): 63 Pulse (bpm): 90 Weight (lbs): 224.7 Respiratory Rate (breaths/min): 18 Body Mass Index (BMI): 39.8 Blood Pressure (mmHg): 140/66 Reference Range: 80 - 120 mg / dl Electronic Signature(s) Signed: 05/13/2019 4:22:05 PM By: Harold Barban Entered By: Harold Barban on 05/13/2019 12:39:46

## 2019-05-14 NOTE — Progress Notes (Signed)
HARTLEIGH, EDMONSTON ([id number]) Visit Report for 05/13/2019 Debridement Details Patient Name: [name], [name]. Date of Service: 05/13/2019 12:45 PM Medical Record Number: [id number] Patient Account Number: [id number] Date of Birth/Sex: 1945-12-09 (73 y.o. F) Treating RN: Cornell Barman Primary Care Provider: [name] Other Clinician: Referring Provider: [name] Treating Provider/Extender: Tito Dine in Treatment: 22 Debridement Performed for Wound #5 Left,Medial Lower Leg Assessment: Performed By: Physician [name] Dillon, MD Debridement Type: Debridement Level of Consciousness (Pre- Awake and Alert procedure): Pre-procedure Verification/Time Yes - 13:03 Out Taken: Start Time: 13:03 Pain Control: Lidocaine Total Area Debrided (L x W): 3.3 (cm) x 3.5 (cm) = 11.55 (cm) Tissue and other material Viable, Non-Viable, Slough, Subcutaneous, Slough debrided: Level: Skin/Subcutaneous Tissue Debridement Description: Excisional Instrument: Curette Bleeding: Moderate Hemostasis Achieved: Pressure End Time: 13:06 Response to Treatment: Procedure was tolerated well Level of Consciousness Awake and Alert (Post-procedure): Post Debridement Measurements of Total Wound Length: (cm) 3.3 Width: (cm) 3.5 Depth: (cm) 0.3 Volume: (cm) 2.721 Character of Wound/Ulcer Post Debridement: Stable Post Procedure Diagnosis Same as Pre-procedure Electronic Signature(s) Signed: 05/13/2019 5:45:20 PM By: Linton Ham MD Signed: 05/13/2019 5:54:22 PM By: [name], [name], RN, CWS, [name], [name] Entered By: Linton Ham on 05/13/2019 13:12:41 Getty, [name] ([id number]) -------------------------------------------------------------------------------- Debridement Details Patient Name: [name], [name]. Date of Service: 05/13/2019 12:45 PM Medical Record Number: [id number] Patient Account Number: [id number] Date of Birth/Sex: [birth year] (73 y.o. F) Treating RN: Cornell Barman Primary  Care Provider: [name] Other Clinician: Referring Provider: [name] Treating Provider/Extender: Tito Dine in Treatment: 22 Debridement Performed for Wound #6 Left,Lateral Lower Leg Assessment: Performed By: Physician [name] Dillon, MD Debridement Type: Debridement Severity of Tissue Pre Fat layer exposed Debridement: Level of Consciousness (Pre- Awake and Alert procedure): Pre-procedure Verification/Time Yes - 13:03 Out Taken: Start Time: 13:03 Pain Control: Lidocaine Total Area Debrided (L x W): 3 (cm) x 3.5 (cm) = 10.5 (cm) Tissue and other material Viable, Non-Viable, Slough, Subcutaneous, Slough debrided: Level: Skin/Subcutaneous Tissue Debridement Description: Excisional Instrument: Curette Bleeding: Moderate Hemostasis Achieved: Pressure End Time: 13:06 Response to Treatment: Procedure was tolerated well Level of Consciousness Awake and Alert (Post-procedure): Post Debridement Measurements of Total Wound Length: (cm) 3 Width: (cm) 3.5 Depth: (cm) 0.4 Volume: (cm) 3.299 Character of Wound/Ulcer Post Debridement: Stable Severity of Tissue Post Debridement: Fat layer exposed Post Procedure Diagnosis Same as Pre-procedure Electronic Signature(s) Signed: 05/13/2019 5:45:20 PM By: Linton Ham MD Signed: 05/13/2019 5:54:22 PM By: [name], [name], RN, CWS, [name], [name] Entered By: Linton Ham on 05/13/2019 13:12:56 [place], [name] ([id number]) -------------------------------------------------------------------------------- HPI Details Patient Name: [name], [name]. Date of Service: 05/13/2019 12:45 PM Medical Record Number: [id number] Patient Account Number: [id number] Date of Birth/Sex: [birth year] (73 y.o. F) Treating RN: Cornell Barman Primary Care Provider: [name] Other Clinician: Referring Provider: [name] Treating Provider/Extender: Tito Dine in Treatment: 22 History of Present  Illness HPI Description: Pleasant 73 year old with history of chronic venous insufficiency. No diabetes or peripheral vascular disease. Left ABI 1.29. Questionable history of left lower extremity DVT. She developed a recurrent ulceration on her left lateral calf in December 2015, which she attributes to poor diet and subsequent lower extremity edema. She underwent endovenous laser ablation of her left greater saphenous vein in 2010. She underwent laser ablation of accessory branch of left GSV in April 2016 by Dr. Kellie Simmering at [facility]. She was previously wearing Unna boots, which she tolerated well. Tolerating 2 layer compression and cadexomer  iodine. She returns to clinic for follow-up and is without new complaints. She denies any significant pain at this time. She reports persistent pain with pressure. No claudication or ischemic rest pain. No fever or chills. No drainage. READMISSION 11/13/16; this is a 73 year old woman who is not a diabetic. She is here for a review of a painful area on her left medial lower extremity. I note that she was seen here previously last year for wound I believe to be in the same area. At that time she had undergone previously a left greater saphenous vein ablation by Dr. Kellie Simmering and she had a ablation of the anterior accessory branch of the left greater saphenous vein in March 2016. Seeing that the wound actually closed over. In reviewing the history with her today the ulcer in this area has been recurrent. She describes a biopsy of this area in 2009 that only showed stasis physiology. 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 Do not see that she has ever had arterial studies in the left leg. She tells me she has a follow-up with Dr. Kellie Simmering in roughly  10 days In any case she developed the reopening of this area roughly a month ago. On the background of this she describes rapidly increasing edema which has responded to Lasix 40 mg and metolazone 2.5 mg as well as the patient's lymph massage. She has been told she has both venous insufficiency and lymphedema but she cannot tolerate compression stockings 11/28/16; the patient saw Dr. Kellie Simmering recently. Per the patient he did arterial Dopplers in the office that did not show evidence of arterial insufficiency, per the patient he stated "treat this like an ordinary venous ulcer". She also saw her dermatologist Dr. Ronnald Ramp who felt that this was more of a vascular ulcer. In general things are improving although she arrives today with increasing bilateral lower extremity edema with weeping a deeper fluid through the wound on the left medial leg compatible with some degree of lymphedema 12/04/16; the patient's wound is fully epithelialized but I don't think fully healed. We will do another week of depression with Promogran and TCA however I suspect we'll be able to discharge her next week. This is a very unusual-looking wound which was initially a figure-of-eight type wound lying on its side surrounded by petechial like hemorrhage. She has had venous ablation on this side. She apparently does not have an arterial issue per Dr. Kellie Simmering. She saw her dermatologist thought it was "vascular". Patient is definitely going to need ongoing compression and I talked about this with her today she will go to elastic therapy after she leaves here next week 12/11/16; the patient's wound is not completely closed today. She has surrounding scar tissue and in further discussion with the patient it would appear that she had ulcers in this area in 2009 for a prolonged period of time ultimately requiring a punch biopsy of this area that only showed venous insufficiency. I did not previously pickup on this part of the history from  the patient. 12/18/16; the patient's wound is completely epithelialized. There is no open area here. She has significant bilateral venous insufficiency with secondary lymphedema to a mild-to-moderate degree she does not have compression stockings.. She did not say anything to me when I  was in the room, she told our intake nurse that she was still having pain in this area. This isn't unusual recurrent small open area. She is going to go to elastic therapy to obtain compression stockings. 12/25/16; the patient's wound is fully epithelialized. There is no open area here. The patient describes some continued episodic discomfort in this area medial left calf. However everything looks fine and healed here. She is been to elastic therapy and caught herself 15-20 mmHg stockings, they apparently were having trouble getting 20-30 mm stockings in her size LUX, SKILTON ([id number]) 01/22/17; this is a patient we discharged from the clinic a month ago. She has a recurrent open wound on her medial left calf. She had 15 mm support stockings. I told her I thought she needed 20-30 mm compression stockings. She tells me that she has been ill with hospitalization secondary to asthma and is been found to have severe hypokalemia likely secondary to a combination of Lasix and metolazone. This morning she noted blistering and leaking fluid on the posterior part of her left leg. She called our intake nurse urgently and we was saw her this afternoon. She has not had any real discomfort here. I don't know that she's been wearing any stockings on this leg for at least 2-3 days. ABIs in this clinic were 1.21 on the right and 1.3 on the left. She is previously seen vascular surgery who does not think that there is a peripheral arterial issue. 01/30/17; Patient arrives with no open wound on the left leg. She has been to elastic therapy and obtained 20-35mmhg below knee stockings and she has one on the right leg  today. READMISSION 02/19/18; this Westra is a now 73 year old patient we've had in this clinic perhaps 3 times before. I had last looked at her from January 07 December 2016 with an area on the medial left leg. We discharged her on 12/25/16 however she had to be readmitted on 01/22/17 with a recurrence. I have in my notes that we discharged her on 20-30 mm stockings although she tells me she was only wearing support hose because she cannot get stockings on predominantly related to her cervical spine surgery/issues. She has had previous ablations done by vein and vascular in [place] including a great saphenous vein ablation on the left with an anterior accessory branch ablation I think both of these were in 2016. On one of the previous visit she had a biopsy noted 2009 that was negative. She is not felt to have an arterial issue. She is not a diabetic. She does have a history of obstructive sleep apnea hypertension asthma as well as chronic venous insufficiency and lymphedema. On this occasion she noted 2 dry scaly patch on her left leg. She tried to put lotion on this it didn't really help. There were 2 open areas.the patient has been seeing her primary physician from 02/05/18 through 02/14/18. She had Unna boots applied. The superior wound now on the lateral left leg has closed but she's had one wound that remains open on the lateral left leg. This is not the same spot as we dealt with in 2018. ABIs in this clinic were 1.3 bilaterally 02/26/18; patient has a small wound on the left lateral calf. Dimensions are down. She has chronic venous insufficiency and lymphedema. 03/05/18; small open area on the left lateral calf. Dimensions are down. Tightly adherent necrotic debris over the surface of the wound which was difficult to remove. Also the dressing [over collagen]  stuck to the wound surface. This was removed with some difficulty as well. Change the primary dressing to Hydrofera Blue ready 03/12/18;  small open area on the left lateral calf. Comes in with tightly adherent surface eschar as well as some adherent Hydrofera Blue. 03/19/18; open area on the left lateral calf. Again adherent surface eschar as well as some adherent Hydrofera Blue nonviable subcutaneous tissue. She complained of pain all week even with the reduction from 4-3 layer compression I put on last week. Also she had an increase in her ankle and calf measurements probably related to the same thing. 03/26/18; open area on the left lateral calf. A very small open area remains here. We used silver alginate starting last week as the Hydrofera Blue seem to stick to the wound bed. In using 4-layer compression 04/02/18; the open area in the left lateral calf at some adherent slough which I removed there is no open area here. We are able to transition her into her own compression stocking. Truthfully I think this is probably his support hose. However this does not maintain skin integrity will be limited. She cannot put over the toe compression stockings on because of neck problems hand problems etc. She is allergic to the lining layer of juxta lites. We might be forced to use extremitease stocking should this fail READMIT 11/24/2018 Patient is now a 73 year old woman who is not a diabetic. She has been in this clinic on at least 3 previous occasions largely with recurrent wounds on her left leg secondary to chronic venous insufficiency with secondary lymphedema. Her situation is complicated by inability to get stockings on and an allergy to neoprene which is apparently a component and at least juxta lites and other stockings. As a result she really has not been wearing any stockings on her legs. She tells [place] that roughly 2 or 3 weeks ago she started noticing a stinging sensation just above her ankle on the left medial aspect. She has been diagnosed with pseudogout and she wondered whether this was what she was experiencing. She tried to  dress this with something she bought at the store however subsequently it pulled skin off and now she has an open wound that is not improving. She has been using Vaseline gauze with a cover bandage. She saw her primary doctor last week who put an Haematologist on her. ABIs in this clinic was 1.03 on the left IVELIS, NORGARD. ([id number]) 2/12; the area is on the left medial ankle. Odd-looking wound with what looks to be surface epithelialization but a multitude of small petechial openings. This clearly not closed yet. We have been using silver alginate under 3 layer compression with TCA 2/19; the wound area did not look quite as good this week. Necrotic debris over the majority of the wound surface which required debridement. She continues to have a multitude of what looked to be small petechial openings. She reminds [place] that she had a biopsy on this initially during her first outbreak in 2015 in [place] [facility]. She expresses concern about this being a possible melanoma. 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 I assured her that this does not look anything like melanoma. Except for the petechial reaction it does look like a venous insufficiency area and she certainly has evidence of this on both sides 2/26; a difficult area on the left medial ankle. The patient clearly has chronic venous hypertension  with some degree of lymphedema. The odd thing about the area is the small petechial hemorrhages. I am not really sure how to explain this. This was present last time and this is not a compression injury. We have been using Hydrofera Blue which I changed to last week 3/4; still using Hydrofera Blue. Aggressive debridement today. She does not have known arterial issues. She has seen Dr. Kellie Simmering at [facility] vein and vascular and and has an ablation on the left. [Anterior accessory branch of the  greater saphenous]. From what I remember they did not feel she had an arterial issue. The patient has had this area biopsied in 2009 at [facility] dermatology and by her recollection they said this was "stasis". She is also follow-up with dermatology locally who thought that this was more of a vascular issue 3/11; using Hydrofera Blue. Aggressive debridement today. She does not have an arterial issue. We are using 3 layer compression although we may need to go to 4. The patient has been in for multiple changes to her wrap since I last saw her a week ago. She says that the area was leaking. I do not have too much more information on what was found 01/19/19 on evaluation today patient was actually being seen for a nurse visit when unfortunately she had the area on her left lateral lower extremity as well as weeping from the right lower extremity that became apparent. Therefore we did end up actually seeing her for a full visit with myself. She is having some pain at this site as well but fortunately nothing too significant at this point. No fevers, chills, nausea, or vomiting noted at this time. 3/18-Patient is back to the clinic with the left leg venous leg ulcer, the ulcer is larger in size, has a surface that is densely adherent with fibrinous tissue, the Hydrofera Blue was used but is densely adherent and there was difficulty in removing it. The right lower extremity was also wrapped for weeping edema. Patient has a new area over the left lateral foot above the malleolus that is small and appears to have no debris with intact surrounding skin. Patient is on increased dose of Lasix also as a means to edema management 3/25; the patient has a nonhealing venous ulcer on the medial left leg and last week developed a smaller area on the lateral left calf. We have been using Hydrofera Blue with a contact layer. 4/1; no major change in these wounds areas. Left medial and more recently left lateral calf. I  tried Iodoflex last week to aid in debridement she did not tolerate this. She stated her pain was terrible all week. She took the top layer of the 4 layer compression off. 4/8; the patient actually looks somewhat better in terms of her more prominent left lateral calf wound. There is some healthy looking tissue here. She is still complaining of a lot of discomfort. 4/15; patient in a lot of pain secondary to sciatica. She is on a prednisone taper prescribed by her primary physician. She has the 2 areas one on the left medial and more recently a smaller area on the left lateral calf. Both of these just above the malleoli 4/22; her back pain is better but she still states she is very uncomfortable and now feels she is intolerant to the [employer]. No real change in the wounds we have been using Sorbact. She has been previously intolerant to Iodoflex. There is not a lot of option about what we  can use to debride this wound under compression that she no doubt needs. sHe states Ultram no longer works for her pain 4/29; no major change in the wounds slightly increased depth. Surface on the original medial wound perhaps somewhat improved however the more recent area on the lateral left ankle is 100% covered in very adherent debris we have been using Sorbact. She tolerates 4 layer compression well and her edema control is a lot better. She has not had to come in for a nurse check 5/6; no major change in the condition of the wounds. She did consent to debridement today which was done with some difficulty. Continuing Sorbact. She did not tolerate Iodoflex. She was in for a check of her compression the day after we wrapped her last week this was adjusted but nothing much was found 5/13; no major change in the condition or area of the wounds. I was able to get a fairly aggressive debridement done on the lateral left leg wound. Even using Sorbact under compression. She came back in on Friday to have the wrap  changed. She says she felt uncomfortable on the lateral aspect of her ankle. She has a long history of chronic venous insufficiency including previous ablation surgery on this side. 5/20-Patient returns for wounds on left leg with both wounds covered in slough, with the lateral leg wound larger in size, she has been in 3 layer compression and felt more comfortable, she describes pain in ankle, in leg and pins and needles in foot, and is about to try Pamelor for this 6/3; wounds on the left lateral and left medial leg. The area medially which is the most recent of the 2 seems to have had the largest increase in dimensions. We have been using Sorbac to try and debride the surface. She has been to see orthopedics they apparently did a plain x-ray that was indeterminant. Diagnosed her with neuropathy and they have ordered an MRI to LATANGA, NEDROW. ([id number]) determine if there is underlying osteomyelitis. This was not high on my thought list but I suppose it is prudent. We have advised her to make an appointment with vein and vascular in [place]. She has a history of a left greater saphenous and accessory vein ablations I wonder if there is anything else that can be done from a surgical point of view to help in these difficult refractory wounds. We have previously healed this wound on one occasion but it keeps on reopening [medial side] 6/10; deep tissue culture I did last week I think on the left medial wound showed both moderate E. coli and moderate staph aureus [MSSA]. She is going to require antibiotics and I have chosen Augmentin. We have been using Sorbact and we have made better looking wound surface on both sides but certainly no improvement in wound area. She was back in last Friday apparently for a dressing changes the wrap was hurting her outer left ankle. She has not managed to get a hold of vein and vascular in [place]. We are going to have to make her that appointment 6/17;  patient is tolerating the Augmentin. She had an MRI that I think was ordered by orthopedic surgeon this did not show osteomyelitis or an abscess did suggest cellulitis. We have been using Sorbact to the lateral and medial ankles. We have been trying to arrange a follow-up appointment with vein and vascular in [place] or did her original ablations. We apparently an area sent the request to vein and vascular  in [facility] 6/24; patient has completed the Augmentin. We do not yet have a vein and vascular appointment in [place]. I am not sure what the issue is here we have asked her to call tomorrow. We are using Sorbact. Making some improvements and especially the medial wound. Both surfaces however look better medial and lateral. 7/1; the patient has been in contact with vein and vascular in [place] but has not yet received an appointment. Using Sorbact we have gradually improve the wound surface with no improvement in surface area. She is approved for Apligraf but the wound surface still is not completely viable. She has not had to come in for a dressing change 7/8; the patient has an appointment with vein and vascular on 7/31 which is a Friday afternoon. She is concerned about getting back here for [place] to dress her wounds. I think it is important to have them goal for her venous reflux/history of ablations etc. to see if anything else can be done. She apparently tested positive for 1 of the blood tests with regards to lupus and saw a rheumatologist. He has raised the issue of vasculitis again. I have had this thought in the past however the evidence seems overwhelming that this is a venous reflux etiology. If the rheumatologist tells me there is clinical and laboratory investigation is positive for lupus I will rethink this. Electronic Signature(s) Signed: 05/13/2019 5:45:20 PM By: Linton Ham MD Entered By: Linton Ham on 05/13/2019 13:14:57 Brousseau, [name]  ([id number]) -------------------------------------------------------------------------------- Physical Exam Details Patient Name: [name], [name]. Date of Service: 05/13/2019 12:45 PM Medical Record Number: [id number] Patient Account Number: [id number] Date of Birth/Sex: Nov 22, 1945 (73 y.o. F) Treating RN: Cornell Barman Primary Care Provider: [name] Other Clinician: Referring Provider: [name] Treating Provider/Extender: Tito Dine in Treatment: 22 Constitutional Sitting or standing Blood Pressure is within target range for patient.. Pulse regular and within target range for patient.Marland Kitchen Respirations regular, non-labored and within target range.. Temperature is normal and within the target range for the patient.Marland Kitchen appears in no distress. Notes Wound exam; the original wound on the right medial leg and the more recent area on the lateral leg again get debrided of surface slough/necrotic tissue. She tolerates this much better and the results are generally healthy looking tissue. The area medially seems to be slightly larger. We have good edema control. Electronic Signature(s) Signed: 05/13/2019 5:45:20 PM By: Linton Ham MD Entered By: Linton Ham on 05/13/2019 13:15:59 [name], [name] ([id number]) -------------------------------------------------------------------------------- Physician Orders Details Patient Name: [name], [name]. Date of Service: 05/13/2019 12:45 PM Medical Record Number: [id number] Patient Account Number: [id number] Date of Birth/Sex: [birth year] (73 y.o. F) Treating RN: Cornell Barman Primary Care Provider: [name] Other Clinician: Referring Provider: [name] Treating Provider/Extender: Tito Dine in Treatment: 22 Verbal / Phone Orders: No Diagnosis Coding Wound Cleansing Wound #5 Left,Medial Lower Leg o Clean wound with Normal Saline. o May Shower, gently pat wound dry prior to applying new  dressing. Wound #6 Left,Lateral Lower Leg o Clean wound with Normal Saline. o May Shower, gently pat wound dry prior to applying new dressing. Anesthetic (add to Medication List) Wound #5 Left,Medial Lower Leg o Topical Lidocaine 4% cream applied to wound bed prior to debridement (In Clinic Only). o Benzocaine Topical Anesthetic Spray applied to wound bed prior to debridement (In Clinic Only). Wound #6 Left,Lateral Lower Leg o Topical Lidocaine 4% cream applied to wound bed prior to debridement (In Clinic Only). o Benzocaine Topical  Anesthetic Spray applied to wound bed prior to debridement (In Clinic Only). Skin Barriers/Peri-Wound Care Wound #5 Left,Medial Lower Leg o Other: - Antibiotic ointment on irritated areas Wound #6 Left,Lateral Lower Leg o Other: - Antibiotic ointment on irritated areas Primary Wound Dressing Wound #5 Left,Medial Lower Leg o Other: - Sorbact Wound #6 Left,Lateral Lower Leg o Other: - Sorbact Secondary Dressing Wound #5 Left,Medial Lower Leg o XtraSorb Wound #6 Left,Lateral Lower Leg o XtraSorb Dressing Change Frequency Wound #5 Left,Medial Lower Leg o Change dressing every week KENDRIA, HALBERG ([id number]) Wound #6 Left,Lateral Lower Leg o Change dressing every week Follow-up Appointments Wound #5 Left,Medial Lower Leg o Return Appointment in 1 week. o Nurse Visit as needed Wound #6 Left,Lateral Lower Leg o Return Appointment in 1 week. o Nurse Visit as needed Edema Control Wound #5 Left,Medial Lower Leg o 3 Layer Compression System - Left Lower Extremity Wound #6 Left,Lateral Lower Leg o 3 Layer Compression System - Left Lower Extremity Off-Loading Wound #5 Left,Medial Lower Leg o Other: - Elevate legs as needed Wound #6 Left,Lateral Lower Leg o Other: - Elevate legs as needed Electronic Signature(s) Signed: 05/13/2019 5:45:20 PM By: Linton Ham MD Signed: 05/13/2019 5:54:22 PM By: [name], [name],  RN, CWS, [name], [name] Entered By: [name], [name], RN, CWS, Kim on 05/13/2019 13:09:49 [name] ([id number]) -------------------------------------------------------------------------------- Problem List Details Patient Name: TEESHA, OHM. Date of Service: 05/13/2019 12:45 PM Medical Record Number: [id number] Patient Account Number: [id number] Date of Birth/Sex: 20-May-1946 (73 y.o. F) Treating RN: Cornell Barman Primary Care Provider: [name] Other Clinician: Referring Provider: [name] Treating Provider/Extender: Tito Dine in Treatment: 22 Active Problems ICD-10 Evaluated Encounter Code Description Active Date Today Diagnosis L97.221 Non-pressure chronic ulcer of left calf limited to breakdown of 01/07/2019 No Yes skin I87.321 Chronic venous hypertension (idiopathic) with inflammation of 12/10/2018 No Yes right lower extremity I89.0 Lymphedema, not elsewhere classified 12/10/2018 No Yes Inactive Problems Resolved Problems Electronic Signature(s) Signed: 05/13/2019 5:45:20 PM By: Linton Ham MD Entered By: Linton Ham on 05/13/2019 13:11:42 Basden, [name] ([id number]) -------------------------------------------------------------------------------- Progress Note Details Patient Name: [name], [name]. Date of Service: 05/13/2019 12:45 PM Medical Record Number: [id number] Patient Account Number: [id number] Date of Birth/Sex: January 31, 1946 (73 y.o. F) Treating RN: Cornell Barman Primary Care Provider: [name] Other Clinician: Referring Provider: [name] Treating Provider/Extender: Tito Dine in Treatment: 22 Subjective History of Present Illness (HPI) Pleasant 73 year old with history of chronic venous insufficiency. No diabetes or peripheral vascular disease. Left ABI 1.29. Questionable history of left lower extremity DVT. She developed a recurrent ulceration on her left lateral calf in December 2015, which she attributes to  poor diet and subsequent lower extremity edema. She underwent endovenous laser ablation of her left greater saphenous vein in 2010. She underwent laser ablation of accessory branch of left GSV in April 2016 by Dr. Kellie Simmering at [facility]. She was previously wearing Unna boots, which she tolerated well. Tolerating 2 layer compression and cadexomer iodine. She returns to clinic for follow-up and is without new complaints. She denies any significant pain at this time. She reports persistent pain with pressure. No claudication or ischemic rest pain. No fever or chills. No drainage. READMISSION 11/13/16; this is a 73 year old woman who is not a diabetic. She is here for a review of a painful area on her left medial lower extremity. I note that she was seen here previously last year for wound I believe to be in the same  area. At that time she had undergone previously a left greater saphenous vein ablation by Dr. Kellie Simmering and she had a ablation of the anterior accessory branch of the left greater saphenous vein in March 2016. Seeing that the wound actually closed over. In reviewing the history with her today the ulcer in this area has been recurrent. She describes a biopsy of this area in 2009 that only showed stasis physiology. 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 Do not see that she has ever had arterial studies in the left leg. She tells me she has a follow-up with Dr. Kellie Simmering in roughly 10 days In any case she developed the reopening of this area roughly a month ago. On the background of this she describes rapidly increasing edema which has responded to Lasix 40 mg and metolazone 2.5 mg as well as the patient's lymph massage. She has been told she has both venous insufficiency and  lymphedema but she cannot tolerate compression stockings 11/28/16; the patient saw Dr. Kellie Simmering recently. Per the patient he did arterial Dopplers in the office that did not show evidence of arterial insufficiency, per the patient he stated "treat this like an ordinary venous ulcer". She also saw her dermatologist Dr. Ronnald Ramp who felt that this was more of a vascular ulcer. In general things are improving although she arrives today with increasing bilateral lower extremity edema with weeping a deeper fluid through the wound on the left medial leg compatible with some degree of lymphedema 12/04/16; the patient's wound is fully epithelialized but I don't think fully healed. We will do another week of depression with Promogran and TCA however I suspect we'll be able to discharge her next week. This is a very unusual-looking wound which was initially a figure-of-eight type wound lying on its side surrounded by petechial like hemorrhage. She has had venous ablation on this side. She apparently does not have an arterial issue per Dr. Kellie Simmering. She saw her dermatologist thought it was "vascular". Patient is definitely going to need ongoing compression and I talked about this with her today she will go to elastic therapy after she leaves here next week 12/11/16; the patient's wound is not completely closed today. She has surrounding scar tissue and in further discussion with the patient it would appear that she had ulcers in this area in 2009 for a prolonged period of time ultimately requiring a punch biopsy of this area that only showed venous insufficiency. I did not previously pickup on this part of the history from the patient. 12/18/16; the patient's wound is completely epithelialized. There is no open area here. She has significant bilateral venous insufficiency with secondary lymphedema to a mild-to-moderate degree she does not have compression stockings.. She did not say anything to me when I was in the room,  she told our intake nurse that she was still having pain in this area. This isn't unusual recurrent small open area. She is going to go to elastic therapy to obtain compression stockings. 12/25/16; the patient's wound is fully epithelialized. There is no open area here. The patient describes some continued episodic discomfort in this area medial left calf. However everything looks fine and healed here. She is been to elastic therapy and MARIEL, LUKINS ([id number]) caught herself 15-20 mmHg  stockings, they apparently were having trouble getting 20-30 mm stockings in her size 01/22/17; this is a patient we discharged from the clinic a month ago. She has a recurrent open wound on her medial left calf. She had 15 mm support stockings. I told her I thought she needed 20-30 mm compression stockings. She tells me that she has been ill with hospitalization secondary to asthma and is been found to have severe hypokalemia likely secondary to a combination of Lasix and metolazone. This morning she noted blistering and leaking fluid on the posterior part of her left leg. She called our intake nurse urgently and we was saw her this afternoon. She has not had any real discomfort here. I don't know that she's been wearing any stockings on this leg for at least 2-3 days. ABIs in this clinic were 1.21 on the right and 1.3 on the left. She is previously seen vascular surgery who does not think that there is a peripheral arterial issue. 01/30/17; Patient arrives with no open wound on the left leg. She has been to elastic therapy and obtained 20-86mmhg below knee stockings and she has one on the right leg today. READMISSION 02/19/18; this Placide is a now 73 year old patient we've had in this clinic perhaps 3 times before. I had last looked at her from January 07 December 2016 with an area on the medial left leg. We discharged her on 12/25/16 however she had to be readmitted on 01/22/17 with a recurrence. I have in my notes  that we discharged her on 20-30 mm stockings although she tells me she was only wearing support hose because she cannot get stockings on predominantly related to her cervical spine surgery/issues. She has had previous ablations done by vein and vascular in [place] including a great saphenous vein ablation on the left with an anterior accessory branch ablation I think both of these were in 2016. On one of the previous visit she had a biopsy noted 2009 that was negative. She is not felt to have an arterial issue. She is not a diabetic. She does have a history of obstructive sleep apnea hypertension asthma as well as chronic venous insufficiency and lymphedema. On this occasion she noted 2 dry scaly patch on her left leg. She tried to put lotion on this it didn't really help. There were 2 open areas.the patient has been seeing her primary physician from 02/05/18 through 02/14/18. She had Unna boots applied. The superior wound now on the lateral left leg has closed but she's had one wound that remains open on the lateral left leg. This is not the same spot as we dealt with in 2018. ABIs in this clinic were 1.3 bilaterally 02/26/18; patient has a small wound on the left lateral calf. Dimensions are down. She has chronic venous insufficiency and lymphedema. 03/05/18; small open area on the left lateral calf. Dimensions are down. Tightly adherent necrotic debris over the surface of the wound which was difficult to remove. Also the dressing [over collagen] stuck to the wound surface. This was removed with some difficulty as well. Change the primary dressing to Hydrofera Blue ready 03/12/18; small open area on the left lateral calf. Comes in with tightly adherent surface eschar as well as some adherent Hydrofera Blue. 03/19/18; open area on the left lateral calf. Again adherent surface eschar as well as some adherent Hydrofera Blue nonviable subcutaneous tissue. She complained of pain all week even with the  reduction from 4-3 layer compression I put on last  week. Also she had an increase in her ankle and calf measurements probably related to the same thing. 03/26/18; open area on the left lateral calf. A very small open area remains here. We used silver alginate starting last week as the Hydrofera Blue seem to stick to the wound bed. In using 4-layer compression 04/02/18; the open area in the left lateral calf at some adherent slough which I removed there is no open area here. We are able to transition her into her own compression stocking. Truthfully I think this is probably his support hose. However this does not maintain skin integrity will be limited. She cannot put over the toe compression stockings on because of neck problems hand problems etc. She is allergic to the lining layer of juxta lites. We might be forced to use extremitease stocking should this fail READMIT 11/24/2018 Patient is now a 73 year old woman who is not a diabetic. She has been in this clinic on at least 3 previous occasions largely with recurrent wounds on her left leg secondary to chronic venous insufficiency with secondary lymphedema. Her situation is complicated by inability to get stockings on and an allergy to neoprene which is apparently a component and at least juxta lites and other stockings. As a result she really has not been wearing any stockings on her legs. She tells [place] that roughly 2 or 3 weeks ago she started noticing a stinging sensation just above her ankle on the left medial aspect. She has been diagnosed with pseudogout and she wondered whether this was what she was experiencing. She tried to dress this with something she bought at the store however subsequently it pulled skin off and now she has an open wound that is not improving. She has been using Vaseline gauze with a cover bandage. She saw her primary doctor last week who put an Haematologist on her. ABIs in this clinic was 1.03 on the left [name], MINISH. ([id number]) 2/12; the area is on the left medial ankle. Odd-looking wound with what looks to be surface epithelialization but a multitude of small petechial openings. This clearly not closed yet. We have been using silver alginate under 3 layer compression with TCA 2/19; the wound area did not look quite as good this week. Necrotic debris over the majority of the wound surface which required debridement. She continues to have a multitude of what looked to be small petechial openings. She reminds [place] that she had a biopsy on this initially during her first outbreak in 2015 in [place] dermatology. She expresses concern about this being a possible melanoma. 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 I assured her that this does not look anything like melanoma. Except for the petechial reaction it does look like a venous insufficiency area and she certainly has evidence of this on both sides 2/26; a difficult area on the left medial ankle. The patient clearly has chronic venous hypertension with some degree of lymphedema. The odd thing about the area is the small petechial hemorrhages. I am not really sure how to explain this. This was present last time and this is not a compression injury. We have been using Hydrofera Blue which I changed to last week 3/4; still using Hydrofera Blue. Aggressive debridement today. She does not have known arterial issues. She has seen Dr. Kellie Simmering at [facility] vein and vascular and and has an ablation on the left. [Anterior accessory branch  of the greater saphenous]. From what I remember they did not feel she had an arterial issue. The patient has had this area biopsied in 2009 at [facility] dermatology and by her recollection they said this was "stasis". She is also follow-up with dermatology locally who thought that this was more of a vascular issue 3/11; using Hydrofera Blue.  Aggressive debridement today. She does not have an arterial issue. We are using 3 layer compression although we may need to go to 4. The patient has been in for multiple changes to her wrap since I last saw her a week ago. She says that the area was leaking. I do not have too much more information on what was found 01/19/19 on evaluation today patient was actually being seen for a nurse visit when unfortunately she had the area on her left lateral lower extremity as well as weeping from the right lower extremity that became apparent. Therefore we did end up actually seeing her for a full visit with myself. She is having some pain at this site as well but fortunately nothing too significant at this point. No fevers, chills, nausea, or vomiting noted at this time. 3/18-Patient is back to the clinic with the left leg venous leg ulcer, the ulcer is larger in size, has a surface that is densely adherent with fibrinous tissue, the Hydrofera Blue was used but is densely adherent and there was difficulty in removing it. The right lower extremity was also wrapped for weeping edema. Patient has a new area over the left lateral foot above the malleolus that is small and appears to have no debris with intact surrounding skin. Patient is on increased dose of Lasix also as a means to edema management 3/25; the patient has a nonhealing venous ulcer on the medial left leg and last week developed a smaller area on the lateral left calf. We have been using Hydrofera Blue with a contact layer. 4/1; no major change in these wounds areas. Left medial and more recently left lateral calf. I tried Iodoflex last week to aid in debridement she did not tolerate this. She stated her pain was terrible all week. She took the top layer of the 4 layer compression off. 4/8; the patient actually looks somewhat better in terms of her more prominent left lateral calf wound. There is some healthy looking tissue here. She is still  complaining of a lot of discomfort. 4/15; patient in a lot of pain secondary to sciatica. She is on a prednisone taper prescribed by her primary physician. She has the 2 areas one on the left medial and more recently a smaller area on the left lateral calf. Both of these just above the malleoli 4/22; her back pain is better but she still states she is very uncomfortable and now feels she is intolerant to the [employer]. No real change in the wounds we have been using Sorbact. She has been previously intolerant to Iodoflex. There is not a lot of option about what we can use to debride this wound under compression that she no doubt needs. sHe states Ultram no longer works for her pain 4/29; no major change in the wounds slightly increased depth. Surface on the original medial wound perhaps somewhat improved however the more recent area on the lateral left ankle is 100% covered in very adherent debris we have been using Sorbact. She tolerates 4 layer compression well and her edema control is a lot better. She has not had to come in for  a nurse check 5/6; no major change in the condition of the wounds. She did consent to debridement today which was done with some difficulty. Continuing Sorbact. She did not tolerate Iodoflex. She was in for a check of her compression the day after we wrapped her last week this was adjusted but nothing much was found 5/13; no major change in the condition or area of the wounds. I was able to get a fairly aggressive debridement done on the lateral left leg wound. Even using Sorbact under compression. She came back in on Friday to have the wrap changed. She says she felt uncomfortable on the lateral aspect of her ankle. She has a long history of chronic venous insufficiency including previous ablation surgery on this side. 5/20-Patient returns for wounds on left leg with both wounds covered in slough, with the lateral leg wound larger in size, she has been in 3 layer  compression and felt more comfortable, she describes pain in ankle, in leg and pins and needles in foot, and is about to try Pamelor for this 6/3; wounds on the left lateral and left medial leg. The area medially which is the most recent of the 2 seems to have had the largest increase in dimensions. We have been using Sorbac to try and debride the surface. She has been to see orthopedics CHANTERIA, HAGGARD ([id number]) they apparently did a plain x-ray that was indeterminant. Diagnosed her with neuropathy and they have ordered an MRI to determine if there is underlying osteomyelitis. This was not high on my thought list but I suppose it is prudent. We have advised her to make an appointment with vein and vascular in [place]. She has a history of a left greater saphenous and accessory vein ablations I wonder if there is anything else that can be done from a surgical point of view to help in these difficult refractory wounds. We have previously healed this wound on one occasion but it keeps on reopening [medial side] 6/10; deep tissue culture I did last week I think on the left medial wound showed both moderate E. coli and moderate staph aureus [MSSA]. She is going to require antibiotics and I have chosen Augmentin. We have been using Sorbact and we have made better looking wound surface on both sides but certainly no improvement in wound area. She was back in last Friday apparently for a dressing changes the wrap was hurting her outer left ankle. She has not managed to get a hold of vein and vascular in [place]. We are going to have to make her that appointment 6/17; patient is tolerating the Augmentin. She had an MRI that I think was ordered by orthopedic surgeon this did not show osteomyelitis or an abscess did suggest cellulitis. We have been using Sorbact to the lateral and medial ankles. We have been trying to arrange a follow-up appointment with vein and vascular in [place] or did her  original ablations. We apparently an area sent the request to vein and vascular in [facility] 6/24; patient has completed the Augmentin. We do not yet have a vein and vascular appointment in [name]. I am not sure what the issue is here we have asked her to call tomorrow. We are using Sorbact. Making some improvements and especially the medial wound. Both surfaces however look better medial and lateral. 7/1; the patient has been in contact with vein and vascular in [place] but has not yet received an appointment. Using Sorbact we have gradually improve the wound surface  with no improvement in surface area. She is approved for Apligraf but the wound surface still is not completely viable. She has not had to come in for a dressing change 7/8; the patient has an appointment with vein and vascular on 7/31 which is a Friday afternoon. She is concerned about getting back here for [place] to dress her wounds. I think it is important to have them goal for her venous reflux/history of ablations etc. to see if anything else can be done. She apparently tested positive for 1 of the blood tests with regards to lupus and saw a rheumatologist. He has raised the issue of vasculitis again. I have had this thought in the past however the evidence seems overwhelming that this is a venous reflux etiology. If the rheumatologist tells me there is clinical and laboratory investigation is positive for lupus I will rethink this. Objective Constitutional Sitting or standing Blood Pressure is within target range for patient.. Pulse regular and within target range for patient.Marland Kitchen Respirations regular, non-labored and within target range.. Temperature is normal and within the target range for the patient.Marland Kitchen appears in no distress. Vitals Time Taken: 12:38 PM, Height: 63 in, Weight: 224.7 lbs, BMI: 39.8, Temperature: 98.3 F, Pulse: 90 bpm, Respiratory Rate: 18 breaths/min, Blood Pressure: 140/66 mmHg. General Notes: Wound  exam; the original wound on the right medial leg and the more recent area on the lateral leg again get debrided of surface slough/necrotic tissue. She tolerates this much better and the results are generally healthy looking tissue. The area medially seems to be slightly larger. We have good edema control. Integumentary (Hair, Skin) Wound #5 status is Open. Original cause of wound was Gradually Appeared. The wound is located on the Left,Medial Lower Leg. The wound measures 3.3cm length x 3.5cm width x 0.2cm depth; 9.071cm^2 area and 1.814cm^3 volume. There is Fat Layer (Subcutaneous Tissue) Exposed exposed. There is no tunneling or undermining noted. There is a medium amount of purulent drainage noted. The wound margin is indistinct and nonvisible. There is small (1-33%) pink granulation within the wound bed. There is a large (67-100%) amount of necrotic tissue within the wound bed including Adherent Slough. TASHANTI, DALPORTO ([id number]) Wound #6 status is Open. Original cause of wound was Gradually Appeared. The wound is located on the Left,Lateral Lower Leg. The wound measures 3cm length x 3.5cm width x 0.4cm depth; 8.247cm^2 area and 3.299cm^3 volume. There is Fat Layer (Subcutaneous Tissue) Exposed exposed. There is no tunneling or undermining noted. There is a medium amount of purulent drainage noted. The wound margin is indistinct and nonvisible. There is small (1-33%) pink granulation within the wound bed. There is a large (67-100%) amount of necrotic tissue within the wound bed including Adherent Slough. Assessment Active Problems ICD-10 Non-pressure chronic ulcer of left calf limited to breakdown of skin Chronic venous hypertension (idiopathic) with inflammation of right lower extremity Lymphedema, not elsewhere classified Procedures Wound #5 Pre-procedure diagnosis of Wound #5 is a Lymphedema located on the Left,Medial Lower Leg . There was a Excisional Skin/Subcutaneous Tissue  Debridement with a total area of 11.55 sq cm performed by [name] Dillon, MD. With the following instrument(s): Curette to remove Viable and Non-Viable tissue/material. Material removed includes Subcutaneous Tissue and Slough and after achieving pain control using Lidocaine. No specimens were taken. A time out was conducted at 13:03, prior to the start of the procedure. A Moderate amount of bleeding was controlled with Pressure. The procedure was tolerated well. Post Debridement Measurements: 3.3cm  length x 3.5cm width x 0.3cm depth; 2.721cm^3 volume. Character of Wound/Ulcer Post Debridement is stable. Post procedure Diagnosis Wound #5: Same as Pre-Procedure Wound #6 Pre-procedure diagnosis of Wound #6 is a Venous Leg Ulcer located on the Left,Lateral Lower Leg .Severity of Tissue Pre Debridement is: Fat layer exposed. There was a Excisional Skin/Subcutaneous Tissue Debridement with a total area of 10.5 sq cm performed by [name] Dillon, MD. With the following instrument(s): Curette to remove Viable and Non-Viable tissue/material. Material removed includes Subcutaneous Tissue and Slough and after achieving pain control using Lidocaine. No specimens were taken. A time out was conducted at 13:03, prior to the start of the procedure. A Moderate amount of bleeding was controlled with Pressure. The procedure was tolerated well. Post Debridement Measurements: 3cm length x 3.5cm width x 0.4cm depth; 3.299cm^3 volume. Character of Wound/Ulcer Post Debridement is stable. Severity of Tissue Post Debridement is: Fat layer exposed. Post procedure Diagnosis Wound #6: Same as Pre-Procedure Plan Wound Cleansing: Wound #5 Left,Medial Lower Leg: Clean wound with Normal Saline. KRESHA, ABELSON ([id number]) May Shower, gently pat wound dry prior to applying new dressing. Wound #6 Left,Lateral Lower Leg: Clean wound with Normal Saline. May Shower, gently pat wound dry prior to applying new  dressing. Anesthetic (add to Medication List): Wound #5 Left,Medial Lower Leg: Topical Lidocaine 4% cream applied to wound bed prior to debridement (In Clinic Only). Benzocaine Topical Anesthetic Spray applied to wound bed prior to debridement (In Clinic Only). Wound #6 Left,Lateral Lower Leg: Topical Lidocaine 4% cream applied to wound bed prior to debridement (In Clinic Only). Benzocaine Topical Anesthetic Spray applied to wound bed prior to debridement (In Clinic Only). Skin Barriers/Peri-Wound Care: Wound #5 Left,Medial Lower Leg: Other: - Antibiotic ointment on irritated areas Wound #6 Left,Lateral Lower Leg: Other: - Antibiotic ointment on irritated areas Primary Wound Dressing: Wound #5 Left,Medial Lower Leg: Other: - Sorbact Wound #6 Left,Lateral Lower Leg: Other: - Sorbact Secondary Dressing: Wound #5 Left,Medial Lower Leg: XtraSorb Wound #6 Left,Lateral Lower Leg: XtraSorb Dressing Change Frequency: Wound #5 Left,Medial Lower Leg: Change dressing every week Wound #6 Left,Lateral Lower Leg: Change dressing every week Follow-up Appointments: Wound #5 Left,Medial Lower Leg: Return Appointment in 1 week. Nurse Visit as needed Wound #6 Left,Lateral Lower Leg: Return Appointment in 1 week. Nurse Visit as needed Edema Control: Wound #5 Left,Medial Lower Leg: 3 Layer Compression System - Left Lower Extremity Wound #6 Left,Lateral Lower Leg: 3 Layer Compression System - Left Lower Extremity Off-Loading: Wound #5 Left,Medial Lower Leg: Other: - Elevate legs as needed Wound #6 Left,Lateral Lower Leg: Other: - Elevate legs as needed 1. I am continuing the Sorbact. 2. Surface is still not ready for an Apligraf which is disappointing. 3. I have asked her to see if she can get a better appointment with vein and vascular preferably sooner although they are apparently very behind 4. With regards to the lupus, I in my mind it would be difficult to justify a biopsy unless her  clinical and serologic markers are diagnostic for lupus. IZADORA, ROEHR ([id number]) Electronic Signature(s) Signed: 05/13/2019 5:45:20 PM By: Linton Ham MD Entered By: Linton Ham on 05/13/2019 13:17:41 Alvi, [name] ([id number]) -------------------------------------------------------------------------------- SuperBill Details Patient Name: ELIANAH, [name]. Date of Service: 05/13/2019 Medical Record Number: [id number] Patient Account Number: [id number] Date of Birth/Sex: Oct 21, 1946 (73 y.o. F) Treating RN: Cornell Barman Primary Care Provider: [name] Other Clinician: Referring Provider: [name] Treating Provider/Extender: [name] Dillon Weeks in Treatment: 22 Diagnosis Coding  ICD-10 Codes Code Description [phone] Non-pressure chronic ulcer of left calf limited to breakdown of skin I87.321 Chronic venous hypertension (idiopathic) with inflammation of right lower extremity I89.0 Lymphedema, not elsewhere classified Facility Procedures CPT4 Code: 19166060 Description: 04599 - DEB SUBQ TISSUE 20 SQ CM/< ICD-10 Diagnosis Description L97.221 Non-pressure chronic ulcer of left calf limited to breakdown Modifier: of skin Quantity: 1 CPT4 Code: [id number] Description: 53202 - DEB SUBQ TISS EA ADDL 20CM ICD-10 Diagnosis Description L97.221 Non-pressure chronic ulcer of left calf limited to breakdown Modifier: of skin Quantity: 1 Physician Procedures CPT4 Code: [id number] Description: 61683 - WC PHYS SUBQ TISS 20 SQ CM ICD-10 Diagnosis Description L97.221 Non-pressure chronic ulcer of left calf limited to breakdown Modifier: of skin Quantity: 1 CPT4 Code: [id number] Description: 15520 - WC PHYS SUBQ TISS EA ADDL 20 CM ICD-10 Diagnosis Description L97.221 Non-pressure chronic ulcer of left calf limited to breakdown Modifier: of skin Quantity: 1 Electronic Signature(s) Signed: 05/13/2019 5:45:20 PM By: Linton Ham MD Entered By: Linton Ham on 05/13/2019  13:17:59

## 2019-05-16 DIAGNOSIS — M25531 Pain in right wrist: Secondary | ICD-10-CM | POA: Diagnosis not present

## 2019-05-20 ENCOUNTER — Other Ambulatory Visit: Payer: Self-pay

## 2019-05-20 ENCOUNTER — Encounter: Payer: Medicare Other | Admitting: Internal Medicine

## 2019-05-20 ENCOUNTER — Other Ambulatory Visit: Payer: Self-pay | Admitting: Family Medicine

## 2019-05-20 DIAGNOSIS — I87332 Chronic venous hypertension (idiopathic) with ulcer and inflammation of left lower extremity: Secondary | ICD-10-CM | POA: Diagnosis not present

## 2019-05-20 DIAGNOSIS — I890 Lymphedema, not elsewhere classified: Secondary | ICD-10-CM | POA: Diagnosis not present

## 2019-05-20 DIAGNOSIS — I87312 Chronic venous hypertension (idiopathic) with ulcer of left lower extremity: Secondary | ICD-10-CM | POA: Diagnosis not present

## 2019-05-20 DIAGNOSIS — L97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d: Secondary | ICD-10-CM | POA: Diagnosis not present

## 2019-05-20 DIAGNOSIS — L97221 Non-pressure chronic ulcer of left calf limited to breakdown of skin: Secondary | ICD-10-CM | POA: Diagnosis not present

## 2019-05-20 DIAGNOSIS — L97222 Non-pressure chronic ulcer of left calf with fat layer exposed: Secondary | ICD-10-CM | POA: Diagnosis not present

## 2019-05-21 NOTE — Progress Notes (Signed)
Amber, [name] ([id number]) Visit Report for 05/20/2019 HPI Details Patient Name: Amber, [name]. Date of Service: 05/20/2019 12:45 PM Medical Record Number: [id number] Patient Account Number: [id number] Date of Birth/Sex: [birth year] (73 y.o. F) Treating RN: Cornell Barman Primary Care Provider: [name] Other Clinician: Referring Provider: [name] Treating Provider/Extender: Tito Dine in Treatment: 23 History of Present Illness HPI Description: Pleasant 73 year old with history of chronic venous insufficiency. No diabetes or peripheral vascular disease. Left ABI 1.29. Questionable history of left lower extremity DVT. She developed a recurrent ulceration on her left lateral calf in December 2015, which she attributes to poor diet and subsequent lower extremity edema. She underwent endovenous laser ablation of her left greater saphenous vein in 2010. She underwent laser ablation of accessory branch of left GSV in April 2016 by Dr. Kellie Simmering at [facility]. She was previously wearing Unna boots, which she tolerated well. Tolerating 2 layer compression and cadexomer iodine. She returns to clinic for follow-up and is without new complaints. She denies any significant pain at this time. She reports persistent pain with pressure. No claudication or ischemic rest pain. No fever or chills. No drainage. READMISSION 11/13/16; this is a 73 year old woman who is not a diabetic. She is here for a review of a painful area on her left medial lower extremity. I note that she was seen here previously last year for wound I believe to be in the same area. At that time she had undergone previously a left greater saphenous vein ablation by Dr. Kellie Simmering and she had a ablation of the anterior accessory branch of the left greater saphenous vein in March 2016. Seeing that the wound actually closed over. In reviewing the history with her today the ulcer in this area has been recurrent. She  describes a biopsy of this area in 2009 that only showed stasis physiology. 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 Do not see that she has ever had arterial studies in the left leg. She tells me she has a follow-up with Dr. Kellie Simmering in roughly 10 days In any case she developed the reopening of this area roughly a month ago. On the background of this she describes rapidly increasing edema which has responded to Lasix 40 mg and metolazone 2.5 mg as well as the patient's lymph massage. She has been told she has both venous insufficiency and lymphedema but she cannot tolerate compression stockings 11/28/16; the patient saw Dr. Kellie Simmering recently. Per the patient he did arterial Dopplers in the office that did not show evidence of arterial insufficiency, per the patient he stated "treat this like an ordinary venous ulcer". She also saw her dermatologist Dr. Ronnald Ramp who felt that this was more of a vascular ulcer. In general things are improving although she arrives today with increasing bilateral lower extremity edema with weeping a deeper fluid through the wound on the left medial leg compatible with some degree of lymphedema 12/04/16; the patient's wound is fully epithelialized but I don't think fully healed. We will do another week of depression with Promogran and TCA however I suspect we'll be able to discharge her next week. This is a very unusual-looking wound which was initially a figure-of-eight type wound lying on its side surrounded by petechial like hemorrhage. She  has had venous ablation on this side. She apparently does not have an arterial issue per Dr. Kellie Simmering. She saw her dermatologist thought it was "vascular". Patient is definitely going to need ongoing compression and I  talked about this with her today she will go to elastic therapy after she leaves here next week 12/11/16; the patient's wound is not completely closed today. She has surrounding scar tissue and in further discussion with the patient it would appear that she had ulcers in this area in 2009 for a prolonged period of time ultimately requiring a punch biopsy of this area that only showed venous insufficiency. I did not previously pickup on this part of the history from the patient. 12/18/16; the patient's wound is completely epithelialized. There is no open area here. She has significant bilateral venous insufficiency with secondary lymphedema to a mild-to-moderate degree she does not have compression stockings.. She did not say anything to me when I was in the room, she told our intake nurse that she was still having pain in this area. This isn't Amber, [name] ([id number]) unusual recurrent small open area. She is going to go to elastic therapy to obtain compression stockings. 12/25/16; the patient's wound is fully epithelialized. There is no open area here. The patient describes some continued episodic discomfort in this area medial left calf. However everything looks fine and healed here. She is been to elastic therapy and caught herself 15-20 mmHg stockings, they apparently were having trouble getting 20-30 mm stockings in her size 01/22/17; this is a patient we discharged from the clinic a month ago. She has a recurrent open wound on her medial left calf. She had 15 mm support stockings. I told her I thought she needed 20-30 mm compression stockings. She tells me that she has been ill with hospitalization secondary to asthma and is been found to have severe hypokalemia likely secondary to a combination of Lasix and metolazone. This morning she noted blistering and leaking fluid on the posterior part of her left leg. She called our intake nurse urgently and we was saw her this afternoon. She has not  had any real discomfort here. I don't know that she's been wearing any stockings on this leg for at least 2-3 days. ABIs in this clinic were 1.21 on the right and 1.3 on the left. She is previously seen vascular surgery who does not think that there is a peripheral arterial issue. 01/30/17; Patient arrives with no open wound on the left leg. She has been to elastic therapy and obtained 20-38mmhg below knee stockings and she has one on the right leg today. READMISSION 02/19/18; this Rinck is a now 73 year old patient we've had in this clinic perhaps 3 times before. I had last looked at her from January 07 December 2016 with an area on the medial left leg. We discharged her on 12/25/16 however she had to be readmitted on 01/22/17 with a recurrence. I have in my notes that we discharged her on 20-30 mm stockings although she tells me she was only wearing support hose because she cannot get stockings on predominantly related to her cervical spine surgery/issues. She has had previous ablations done by vein and vascular in [place] including a great saphenous vein ablation on the left with an anterior accessory branch ablation I think both of these were in 2016. On one of the previous visit she had a biopsy noted 2009 that was negative. She is not felt to have an arterial issue. She is not  a diabetic. She does have a history of obstructive sleep apnea hypertension asthma as well as chronic venous insufficiency and lymphedema. On this occasion she noted 2 dry scaly patch on her left leg. She tried to put lotion on this it didn't really help. There were 2 open areas.the patient has been seeing her primary physician from 02/05/18 through 02/14/18. She had Unna boots applied. The superior wound now on the lateral left leg has closed but she's had one wound that remains open on the lateral left leg. This is not the same spot as we dealt with in 2018. ABIs in this clinic were 1.3 bilaterally 02/26/18; patient has a  small wound on the left lateral calf. Dimensions are down. She has chronic venous insufficiency and lymphedema. 03/05/18; small open area on the left lateral calf. Dimensions are down. Tightly adherent necrotic debris over the surface of the wound which was difficult to remove. Also the dressing [over collagen] stuck to the wound surface. This was removed with some difficulty as well. Change the primary dressing to Hydrofera Blue ready 03/12/18; small open area on the left lateral calf. Comes in with tightly adherent surface eschar as well as some adherent Hydrofera Blue. 03/19/18; open area on the left lateral calf. Again adherent surface eschar as well as some adherent Hydrofera Blue nonviable subcutaneous tissue. She complained of pain all week even with the reduction from 4-3 layer compression I put on last week. Also she had an increase in her ankle and calf measurements probably related to the same thing. 03/26/18; open area on the left lateral calf. A very small open area remains here. We used silver alginate starting last week as the Hydrofera Blue seem to stick to the wound bed. In using 4-layer compression 04/02/18; the open area in the left lateral calf at some adherent slough which I removed there is no open area here. We are able to transition her into her own compression stocking. Truthfully I think this is probably his support hose. However this does not maintain skin integrity will be limited. She cannot put over the toe compression stockings on because of neck problems hand problems etc. She is allergic to the lining layer of juxta lites. We might be forced to use extremitease stocking should this fail READMIT 11/24/2018 Patient is now a 73 year old woman who is not a diabetic. She has been in this clinic on at least 3 previous occasions largely with recurrent wounds on her left leg secondary to chronic venous insufficiency with secondary lymphedema. Her situation is complicated by  inability to get stockings on and an allergy to neoprene which is apparently a component and at least juxta lites and other stockings. As a result she really has not been wearing any stockings on her legs. She tells [place] that roughly 2 or 3 weeks ago she started noticing a stinging sensation just above her ankle on the left medial aspect. She has been diagnosed with pseudogout and she wondered whether this was what she was experiencing. She tried to dress this with something she bought at the store however subsequently it pulled skin off and now she has an open wound that is not improving. She has been using Vaseline gauze with a cover bandage. She saw her primary doctor last week who Amber, HEWINS ([id number]) put an Unna boot on her. ABIs in this clinic was 1.03 on the left 2/12; the area is on the left medial ankle. Odd-looking wound with what looks to be surface epithelialization  but a multitude of small petechial openings. This clearly not closed yet. We have been using silver alginate under 3 layer compression with TCA 2/19; the wound area did not look quite as good this week. Necrotic debris over the majority of the wound surface which required debridement. She continues to have a multitude of what looked to be small petechial openings. She reminds [place] that she had a biopsy on this initially during her first outbreak in 2015 in [place] dermatology. She expresses concern about this being a possible melanoma. 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 I assured her that this does not look anything like melanoma. Except for the petechial reaction it does look like a venous insufficiency area and she certainly has evidence of this on both sides 2/26; a difficult area on the left medial ankle. The patient clearly has chronic venous hypertension with some degree of lymphedema. The odd thing about the area is  the small petechial hemorrhages. I am not really sure how to explain this. This was present last time and this is not a compression injury. We have been using Hydrofera Blue which I changed to last week 3/4; still using Hydrofera Blue. Aggressive debridement today. She does not have known arterial issues. She has seen Dr. Kellie Simmering at [facility] vein and vascular and and has an ablation on the left. [Anterior accessory branch of the greater saphenous]. From what I remember they did not feel she had an arterial issue. The patient has had this area biopsied in 2009 at [facility] dermatology and by her recollection they said this was "stasis". She is also follow-up with dermatology locally who thought that this was more of a vascular issue 3/11; using Hydrofera Blue. Aggressive debridement today. She does not have an arterial issue. We are using 3 layer compression although we may need to go to 4. The patient has been in for multiple changes to her wrap since I last saw her a week ago. She says that the area was leaking. I do not have too much more information on what was found 01/19/19 on evaluation today patient was actually being seen for a nurse visit when unfortunately she had the area on her left lateral lower extremity as well as weeping from the right lower extremity that became apparent. Therefore we did end up actually seeing her for a full visit with myself. She is having some pain at this site as well but fortunately nothing too significant at this point. No fevers, chills, nausea, or vomiting noted at this time. 3/18-Patient is back to the clinic with the left leg venous leg ulcer, the ulcer is larger in size, has a surface that is densely adherent with fibrinous tissue, the Hydrofera Blue was used but is densely adherent and there was difficulty in removing it. The right lower extremity was also wrapped for weeping edema. Patient has a new area over the left lateral foot above the malleolus  that is small and appears to have no debris with intact surrounding skin. Patient is on increased dose of Lasix also as a means to edema management 3/25; the patient has a nonhealing venous ulcer on the medial left leg and last week developed a smaller area on the lateral left calf. We have been using Hydrofera Blue with a contact layer. 4/1; no major change in these wounds areas. Left medial and more recently left lateral calf. I tried Iodoflex last week  to aid in debridement she did not tolerate this. She stated her pain was terrible all week. She took the top layer of the 4 layer compression off. 4/8; the patient actually looks somewhat better in terms of her more prominent left lateral calf wound. There is some healthy looking tissue here. She is still complaining of a lot of discomfort. 4/15; patient in a lot of pain secondary to sciatica. She is on a prednisone taper prescribed by her primary physician. She has the 2 areas one on the left medial and more recently a smaller area on the left lateral calf. Both of these just above the malleoli 4/22; her back pain is better but she still states she is very uncomfortable and now feels she is intolerant to the [employer]. No real change in the wounds we have been using Sorbact. She has been previously intolerant to Iodoflex. There is not a lot of option about what we can use to debride this wound under compression that she no doubt needs. sHe states Ultram no longer works for her pain 4/29; no major change in the wounds slightly increased depth. Surface on the original medial wound perhaps somewhat improved however the more recent area on the lateral left ankle is 100% covered in very adherent debris we have been using Sorbact. She tolerates 4 layer compression well and her edema control is a lot better. She has not had to come in for a nurse check 5/6; no major change in the condition of the wounds. She did consent to debridement today which was  done with some difficulty. Continuing Sorbact. She did not tolerate Iodoflex. She was in for a check of her compression the day after we wrapped her last week this was adjusted but nothing much was found 5/13; no major change in the condition or area of the wounds. I was able to get a fairly aggressive debridement done on the lateral left leg wound. Even using Sorbact under compression. She came back in on Friday to have the wrap changed. She says she felt uncomfortable on the lateral aspect of her ankle. She has a long history of chronic venous insufficiency including previous ablation surgery on this side. 5/20-Patient returns for wounds on left leg with both wounds covered in slough, with the lateral leg wound larger in size, she has been in 3 layer compression and felt more comfortable, she describes pain in ankle, in leg and pins and needles in foot, [name], [name] ([id number]) and is about to try Pamelor for this 6/3; wounds on the left lateral and left medial leg. The area medially which is the most recent of the 2 seems to have had the largest increase in dimensions. We have been using Sorbac to try and debride the surface. She has been to see orthopedics they apparently did a plain x-ray that was indeterminant. Diagnosed her with neuropathy and they have ordered an MRI to determine if there is underlying osteomyelitis. This was not high on my thought list but I suppose it is prudent. We have advised her to make an appointment with vein and vascular in [place]. She has a history of a left greater saphenous and accessory vein ablations I wonder if there is anything else that can be done from a surgical point of view to help in these difficult refractory wounds. We have previously healed this wound on one occasion but it keeps on reopening [medial side] 6/10; deep tissue culture I did last week I think on the  left medial wound showed both moderate E. coli and moderate staph aureus  [MSSA]. She is going to require antibiotics and I have chosen Augmentin. We have been using Sorbact and we have made better looking wound surface on both sides but certainly no improvement in wound area. She was back in last Friday apparently for a dressing changes the wrap was hurting her outer left ankle. She has not managed to get a hold of vein and vascular in [place]. We are going to have to make her that appointment 6/17; patient is tolerating the Augmentin. She had an MRI that I think was ordered by orthopedic surgeon this did not show osteomyelitis or an abscess did suggest cellulitis. We have been using Sorbact to the lateral and medial ankles. We have been trying to arrange a follow-up appointment with vein and vascular in [place] or did her original ablations. We apparently an area sent the request to vein and vascular in [facility] 6/24; patient has completed the Augmentin. We do not yet have a vein and vascular appointment in [place]. I am not sure what the issue is here we have asked her to call tomorrow. We are using Sorbact. Making some improvements and especially the medial wound. Both surfaces however look better medial and lateral. 7/1; the patient has been in contact with vein and vascular in [place] but has not yet received an appointment. Using Sorbact we have gradually improve the wound surface with no improvement in surface area. She is approved for Apligraf but the wound surface still is not completely viable. She has not had to come in for a dressing change 7/8; the patient has an appointment with vein and vascular on 7/31 which is a Friday afternoon. She is concerned about getting back here for [place] to dress her wounds. I think it is important to have them goal for her venous reflux/history of ablations etc. to see if anything else can be done. She apparently tested positive for 1 of the blood tests with regards to lupus and saw a rheumatologist. He has raised  the issue of vasculitis again. I have had this thought in the past however the evidence seems overwhelming that this is a venous reflux etiology. If the rheumatologist tells me there is clinical and laboratory investigation is positive for lupus I will rethink this. 7/15; the patient's wound surfaces are quite a bit better. The medial area which was her original wound now has no depth although the lateral wound which was the more recent area actually appears larger. Both with viable surfaces which is indeed better. Using Sorbact. I wanted to use Apligraf on her however there is the issue of the vein and vascular appointment on 7/31 at 2:00 in the afternoon which would not allow her to get back to be rewrapped and they would no doubt remove the graft Electronic Signature(s) Signed: 05/20/2019 4:31:45 PM By: Linton Ham MD Entered By: Linton Ham on 05/20/2019 15:43:10 Bolz, [name] ([id number]) -------------------------------------------------------------------------------- Physical Exam Details Patient Name: Amber, [name]. Date of Service: 05/20/2019 12:45 PM Medical Record Number: [id number] Patient Account Number: [id number] Date of Birth/Sex: Aug 10, 1946 (73 y.o. F) Treating RN: Cornell Barman Primary Care Provider: [name] Other Clinician: Referring Provider: [name] Treating Provider/Extender: Tito Dine in Treatment: 23 Constitutional Sitting or standing Blood Pressure is within target range for patient.. Pulse regular and within target range for patient.Marland Kitchen Respirations regular, non-labored and within target range.. Temperature is normal and within the target range for the patient.Marland Kitchen  appears in no distress. Cardiovascular Pedal pulses palpable on the right. Changes of severe venous hypertension. Lymphatic None palpable in the popliteal area bilaterally. Integumentary (Hair, Skin) Changes chronic venous hypertension dilated small veins in her  feet.Marland Kitchen Psychiatric No evidence of depression, anxiety, or agitation. Calm, cooperative, and communicative. Appropriate interactions and affect.. Notes Wound exam; the original wound on the right medial leg is now superficial much better looking surface. The lateral leg wound is larger but does not require debridement and the surface area looks better as well. Electronic Signature(s) Signed: 05/20/2019 4:31:45 PM By: Linton Ham MD Entered By: Linton Ham on 05/20/2019 15:46:56 Coles, [name] ([id number]) -------------------------------------------------------------------------------- Physician Orders Details Patient Name: Amber, [name]. Date of Service: 05/20/2019 12:45 PM Medical Record Number: [id number] Patient Account Number: [id number] Date of Birth/Sex: 1946-07-31 (73 y.o. F) Treating RN: Cornell Barman Primary Care Provider: [name] Other Clinician: Referring Provider: [name] Treating Provider/Extender: Tito Dine in Treatment: 64 Verbal / Phone Orders: No Diagnosis Coding Wound Cleansing Wound #5 Left,Medial Lower Leg o Clean wound with Normal Saline. o May Shower, gently pat wound dry prior to applying new dressing. Wound #6 Left,Lateral Lower Leg o Clean wound with Normal Saline. o May Shower, gently pat wound dry prior to applying new dressing. Anesthetic (add to Medication List) Wound #5 Left,Medial Lower Leg o Topical Lidocaine 4% cream applied to wound bed prior to debridement (In Clinic Only). o Benzocaine Topical Anesthetic Spray applied to wound bed prior to debridement (In Clinic Only). Wound #6 Left,Lateral Lower Leg o Topical Lidocaine 4% cream applied to wound bed prior to debridement (In Clinic Only). o Benzocaine Topical Anesthetic Spray applied to wound bed prior to debridement (In Clinic Only). Skin Barriers/Peri-Wound Care Wound #5 Left,Medial Lower Leg o Other: - Antibiotic ointment on irritated  areas Wound #6 Left,Lateral Lower Leg o Other: - Antibiotic ointment on irritated areas Primary Wound Dressing Wound #5 Left,Medial Lower Leg o Other: - Sorbact Wound #6 Left,Lateral Lower Leg o Other: - Sorbact Secondary Dressing Wound #5 Left,Medial Lower Leg o XtraSorb Wound #6 Left,Lateral Lower Leg o XtraSorb Dressing Change Frequency Wound #5 Left,Medial Lower Leg o Change dressing every week Amber, [name] ([id number]) Wound #6 Left,Lateral Lower Leg o Change dressing every week Follow-up Appointments Wound #5 Left,Medial Lower Leg o Return Appointment in 1 week. o Nurse Visit as needed Wound #6 Left,Lateral Lower Leg o Return Appointment in 1 week. o Nurse Visit as needed Edema Control Wound #5 Left,Medial Lower Leg o 3 Layer Compression System - Left Lower Extremity Wound #6 Left,Lateral Lower Leg o 3 Layer Compression System - Left Lower Extremity Off-Loading Wound #5 Left,Medial Lower Leg o Other: - Elevate legs as needed Wound #6 Left,Lateral Lower Leg o Other: - Elevate legs as needed Electronic Signature(s) Signed: 05/20/2019 4:20:22 PM By: [name], [name], RN, CWS, [name], [name] Signed: 05/20/2019 4:31:45 PM By: Linton Ham MD Entered By: [name], [name], RN, CWS, Kim on 05/20/2019 13:20:09 Amber, [name] ([id number]) -------------------------------------------------------------------------------- Problem List Details Patient Name: JESLIN, BAZINET. Date of Service: 05/20/2019 12:45 PM Medical Record Number: [id number] Patient Account Number: [id number] Date of Birth/Sex: 01/28/1946 (73 y.o. F) Treating RN: Cornell Barman Primary Care Provider: [name] Other Clinician: Referring Provider: [name] Treating Provider/Extender: Tito Dine in Treatment: 23 Active Problems ICD-10 Evaluated Encounter Code Description Active Date Today Diagnosis L97.221 Non-pressure chronic ulcer of left calf limited to  breakdown of 01/07/2019 No Yes skin I87.321 Chronic venous hypertension (  idiopathic) with inflammation of 12/10/2018 No Yes right lower extremity I89.0 Lymphedema, not elsewhere classified 12/10/2018 No Yes Inactive Problems Resolved Problems Electronic Signature(s) Signed: 05/20/2019 4:31:45 PM By: Linton Ham MD Entered By: Linton Ham on 05/20/2019 15:40:50 Tooker, [name] ([id number]) -------------------------------------------------------------------------------- Progress Note Details Patient Name: BRONWEN, PENDERGRAFT. Date of Service: 05/20/2019 12:45 PM Medical Record Number: [id number] Patient Account Number: [id number] Date of Birth/Sex: [birth year] (73 y.o. F) Treating RN: Cornell Barman Primary Care Provider: [name] Other Clinician: Referring Provider: [name] Treating Provider/Extender: Tito Dine in Treatment: 23 Subjective History of Present Illness (HPI) Pleasant 73 year old with history of chronic venous insufficiency. No diabetes or peripheral vascular disease. Left ABI 1.29. Questionable history of left lower extremity DVT. She developed a recurrent ulceration on her left lateral calf in December 2015, which she attributes to poor diet and subsequent lower extremity edema. She underwent endovenous laser ablation of her left greater saphenous vein in 2010. She underwent laser ablation of accessory branch of left GSV in April 2016 by Dr. Kellie Simmering at [facility]. She was previously wearing Unna boots, which she tolerated well. Tolerating 2 layer compression and cadexomer iodine. She returns to clinic for follow-up and is without new complaints. She denies any significant pain at this time. She reports persistent pain with pressure. No claudication or ischemic rest pain. No fever or chills. No drainage. READMISSION 11/13/16; this is a 73 year old woman who is not a diabetic. She is here for a review of a painful area on her left medial  lower extremity. I note that she was seen here previously last year for wound I believe to be in the same area. At that time she had undergone previously a left greater saphenous vein ablation by Dr. Kellie Simmering and she had a ablation of the anterior accessory branch of the left greater saphenous vein in March 2016. Seeing that the wound actually closed over. In reviewing the history with her today the ulcer in this area has been recurrent. She describes a biopsy of this area in 2009 that only showed stasis physiology. 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 Do not see that she has ever had arterial studies in the left leg. She tells me she has a follow-up with Dr. Kellie Simmering in roughly 10 days In any case she developed the reopening of this area roughly a month ago. On the background of this she describes rapidly increasing edema which has responded to Lasix 40 mg and metolazone 2.5 mg as well as the patient's lymph massage. She has been told she has both venous insufficiency and lymphedema but she cannot tolerate compression stockings 11/28/16; the patient saw Dr. Kellie Simmering recently. Per the patient he did arterial Dopplers in the office that did not show evidence of arterial insufficiency, per the patient he stated "treat this like an ordinary venous ulcer". She also saw her dermatologist Dr. Ronnald Ramp who felt that this was more of a vascular ulcer. In general things are improving although she arrives today with increasing bilateral lower extremity edema with weeping a deeper fluid through the wound on the left medial leg compatible with some degree of lymphedema 12/04/16; the patient's wound is fully epithelialized but I don't think fully healed. We will do another week of depression  with Promogran and TCA however I suspect we'll be able to discharge her next week. This is a very unusual-looking wound which was initially a figure-of-eight type wound lying on its side surrounded by petechial like hemorrhage. She has had venous ablation on this side. She apparently does not have an arterial issue per Dr. Kellie Simmering. She saw her dermatologist thought it was "vascular". Patient is definitely going to need ongoing compression and I talked about this with her today she will go to elastic therapy after she leaves here next week 12/11/16; the patient's wound is not completely closed today. She has surrounding scar tissue and in further discussion with the patient it would appear that she had ulcers in this area in 2009 for a prolonged period of time ultimately requiring a punch biopsy of this area that only showed venous insufficiency. I did not previously pickup on this part of the history from the patient. 12/18/16; the patient's wound is completely epithelialized. There is no open area here. She has significant bilateral venous insufficiency with secondary lymphedema to a mild-to-moderate degree she does not have compression stockings.. She did not say anything to me when I was in the room, she told our intake nurse that she was still having pain in this area. This isn't unusual recurrent small open area. She is going to go to elastic therapy to obtain compression stockings. 12/25/16; the patient's wound is fully epithelialized. There is no open area here. The patient describes some continued episodic discomfort in this area medial left calf. However everything looks fine and healed here. She is been to elastic therapy and SHINEKA, [name] ([id number]) caught herself 15-20 mmHg stockings, they apparently were having trouble getting 20-30 mm stockings in her size 01/22/17; this is a patient we discharged from the clinic a month ago. She has a recurrent open wound on her medial left calf. She  had 15 mm support stockings. I told her I thought she needed 20-30 mm compression stockings. She tells me that she has been ill with hospitalization secondary to asthma and is been found to have severe hypokalemia likely secondary to a combination of Lasix and metolazone. This morning she noted blistering and leaking fluid on the posterior part of her left leg. She called our intake nurse urgently and we was saw her this afternoon. She has not had any real discomfort here. I don't know that she's been wearing any stockings on this leg for at least 2-3 days. ABIs in this clinic were 1.21 on the right and 1.3 on the left. She is previously seen vascular surgery who does not think that there is a peripheral arterial issue. 01/30/17; Patient arrives with no open wound on the left leg. She has been to elastic therapy and obtained 20-46mmhg below knee stockings and she has one on the right leg today. READMISSION 02/19/18; this Wernert is a now 73 year old patient we've had in this clinic perhaps 3 times before. I had last looked at her from January 07 December 2016 with an area on the medial left leg. We discharged her on 12/25/16 however she had to be readmitted on 01/22/17 with a recurrence. I have in my notes that we discharged her on 20-30 mm stockings although she tells me she was only wearing support hose because she cannot get stockings on predominantly related to her cervical spine surgery/issues. She has had previous ablations done by vein and vascular in [place] including a great saphenous vein ablation on the left with an anterior accessory  branch ablation I think both of these were in 2016. On one of the previous visit she had a biopsy noted 2009 that was negative. She is not felt to have an arterial issue. She is not a diabetic. She does have a history of obstructive sleep apnea hypertension asthma as well as chronic venous insufficiency and lymphedema. On this occasion she noted 2 dry scaly  patch on her left leg. She tried to put lotion on this it didn't really help. There were 2 open areas.the patient has been seeing her primary physician from 02/05/18 through 02/14/18. She had Unna boots applied. The superior wound now on the lateral left leg has closed but she's had one wound that remains open on the lateral left leg. This is not the same spot as we dealt with in 2018. ABIs in this clinic were 1.3 bilaterally 02/26/18; patient has a small wound on the left lateral calf. Dimensions are down. She has chronic venous insufficiency and lymphedema. 03/05/18; small open area on the left lateral calf. Dimensions are down. Tightly adherent necrotic debris over the surface of the wound which was difficult to remove. Also the dressing [over collagen] stuck to the wound surface. This was removed with some difficulty as well. Change the primary dressing to Hydrofera Blue ready 03/12/18; small open area on the left lateral calf. Comes in with tightly adherent surface eschar as well as some adherent Hydrofera Blue. 03/19/18; open area on the left lateral calf. Again adherent surface eschar as well as some adherent Hydrofera Blue nonviable subcutaneous tissue. She complained of pain all week even with the reduction from 4-3 layer compression I put on last week. Also she had an increase in her ankle and calf measurements probably related to the same thing. 03/26/18; open area on the left lateral calf. A very small open area remains here. We used silver alginate starting last week as the Hydrofera Blue seem to stick to the wound bed. In using 4-layer compression 04/02/18; the open area in the left lateral calf at some adherent slough which I removed there is no open area here. We are able to transition her into her own compression stocking. Truthfully I think this is probably his support hose. However this does not maintain skin integrity will be limited. She cannot put over the toe compression stockings on  because of neck problems hand problems etc. She is allergic to the lining layer of juxta lites. We might be forced to use extremitease stocking should this fail READMIT 11/24/2018 Patient is now a 73 year old woman who is not a diabetic. She has been in this clinic on at least 3 previous occasions largely with recurrent wounds on her left leg secondary to chronic venous insufficiency with secondary lymphedema. Her situation is complicated by inability to get stockings on and an allergy to neoprene which is apparently a component and at least juxta lites and other stockings. As a result she really has not been wearing any stockings on her legs. She tells [place] that roughly 2 or 3 weeks ago she started noticing a stinging sensation just above her ankle on the left medial aspect. She has been diagnosed with pseudogout and she wondered whether this was what she was experiencing. She tried to dress this with something she bought at the store however subsequently it pulled skin off and now she has an open wound that is not improving. She has been using Vaseline gauze with a cover bandage. She saw her primary doctor last week who  put an Haematologist on her. ABIs in this clinic was 1.03 on the left Amber, [name]. ([id number]) 2/12; the area is on the left medial ankle. Odd-looking wound with what looks to be surface epithelialization but a multitude of small petechial openings. This clearly not closed yet. We have been using silver alginate under 3 layer compression with TCA 2/19; the wound area did not look quite as good this week. Necrotic debris over the majority of the wound surface which required debridement. She continues to have a multitude of what looked to be small petechial openings. She reminds [place] that she had a biopsy on this initially during her first outbreak in 2015 in [place] dermatology. She expresses concern about this being a possible melanoma. 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 I assured her that this does not look anything like melanoma. Except for the petechial reaction it does look like a venous insufficiency area and she certainly has evidence of this on both sides 2/26; a difficult area on the left medial ankle. The patient clearly has chronic venous hypertension with some degree of lymphedema. The odd thing about the area is the small petechial hemorrhages. I am not really sure how to explain this. This was present last time and this is not a compression injury. We have been using Hydrofera Blue which I changed to last week 3/4; still using Hydrofera Blue. Aggressive debridement today. She does not have known arterial issues. She has seen Dr. Kellie Simmering at [facility] vein and vascular and and has an ablation on the left. [Anterior accessory branch of the greater saphenous]. From what I remember they did not feel she had an arterial issue. The patient has had this area biopsied in 2009 at [facility] dermatology and by her recollection they said this was "stasis". She is also follow-up with dermatology locally who thought that this was more of a vascular issue 3/11; using Hydrofera Blue. Aggressive debridement today. She does not have an arterial issue. We are using 3 layer compression although we may need to go to 4. The patient has been in for multiple changes to her wrap since I last saw her a week ago. She says that the area was leaking. I do not have too much more information on what was found 01/19/19 on evaluation today patient was actually being seen for a nurse visit when unfortunately she had the area on her left lateral lower extremity as well as weeping from the right lower extremity that became apparent. Therefore we did end up actually seeing her for a full visit with myself. She is having some pain at this site as well but fortunately nothing too significant at this point.  No fevers, chills, nausea, or vomiting noted at this time. 3/18-Patient is back to the clinic with the left leg venous leg ulcer, the ulcer is larger in size, has a surface that is densely adherent with fibrinous tissue, the Hydrofera Blue was used but is densely adherent and there was difficulty in removing it. The right lower extremity was also wrapped for weeping edema. Patient has a new area over the left lateral foot above the malleolus that is small and appears to have no debris with intact surrounding skin. Patient is on increased dose of Lasix also as a means to edema management 3/25; the patient has a nonhealing venous ulcer on the medial left leg and last week developed a smaller  area on the lateral left calf. We have been using Hydrofera Blue with a contact layer. 4/1; no major change in these wounds areas. Left medial and more recently left lateral calf. I tried Iodoflex last week to aid in debridement she did not tolerate this. She stated her pain was terrible all week. She took the top layer of the 4 layer compression off. 4/8; the patient actually looks somewhat better in terms of her more prominent left lateral calf wound. There is some healthy looking tissue here. She is still complaining of a lot of discomfort. 4/15; patient in a lot of pain secondary to sciatica. She is on a prednisone taper prescribed by her primary physician. She has the 2 areas one on the left medial and more recently a smaller area on the left lateral calf. Both of these just above the malleoli 4/22; her back pain is better but she still states she is very uncomfortable and now feels she is intolerant to the [employer]. No real change in the wounds we have been using Sorbact. She has been previously intolerant to Iodoflex. There is not a lot of option about what we can use to debride this wound under compression that she no doubt needs. sHe states Ultram no longer works for her pain 4/29; no major change in  the wounds slightly increased depth. Surface on the original medial wound perhaps somewhat improved however the more recent area on the lateral left ankle is 100% covered in very adherent debris we have been using Sorbact. She tolerates 4 layer compression well and her edema control is a lot better. She has not had to come in for a nurse check 5/6; no major change in the condition of the wounds. She did consent to debridement today which was done with some difficulty. Continuing Sorbact. She did not tolerate Iodoflex. She was in for a check of her compression the day after we wrapped her last week this was adjusted but nothing much was found 5/13; no major change in the condition or area of the wounds. I was able to get a fairly aggressive debridement done on the lateral left leg wound. Even using Sorbact under compression. She came back in on Friday to have the wrap changed. She says she felt uncomfortable on the lateral aspect of her ankle. She has a long history of chronic venous insufficiency including previous ablation surgery on this side. 5/20-Patient returns for wounds on left leg with both wounds covered in slough, with the lateral leg wound larger in size, she has been in 3 layer compression and felt more comfortable, she describes pain in ankle, in leg and pins and needles in foot, and is about to try Pamelor for this 6/3; wounds on the left lateral and left medial leg. The area medially which is the most recent of the 2 seems to have had the largest increase in dimensions. We have been using Sorbac to try and debride the surface. She has been to see orthopedics Amber, [name] ([id number]) they apparently did a plain x-ray that was indeterminant. Diagnosed her with neuropathy and they have ordered an MRI to determine if there is underlying osteomyelitis. This was not high on my thought list but I suppose it is prudent. We have advised her to make an appointment with vein and vascular  in [place]. She has a history of a left greater saphenous and accessory vein ablations I wonder if there is anything else that can be done from a surgical  point of view to help in these difficult refractory wounds. We have previously healed this wound on one occasion but it keeps on reopening [medial side] 6/10; deep tissue culture I did last week I think on the left medial wound showed both moderate E. coli and moderate staph aureus [MSSA]. She is going to require antibiotics and I have chosen Augmentin. We have been using Sorbact and we have made better looking wound surface on both sides but certainly no improvement in wound area. She was back in last Friday apparently for a dressing changes the wrap was hurting her outer left ankle. She has not managed to get a hold of vein and vascular in [place]. We are going to have to make her that appointment 6/17; patient is tolerating the Augmentin. She had an MRI that I think was ordered by orthopedic surgeon this did not show osteomyelitis or an abscess did suggest cellulitis. We have been using Sorbact to the lateral and medial ankles. We have been trying to arrange a follow-up appointment with vein and vascular in [place] or did her original ablations. We apparently an area sent the request to vein and vascular in [facility] 6/24; patient has completed the Augmentin. We do not yet have a vein and vascular appointment in [place]. I am not sure what the issue is here we have asked her to call tomorrow. We are using Sorbact. Making some improvements and especially the medial wound. Both surfaces however look better medial and lateral. 7/1; the patient has been in contact with vein and vascular in [place] but has not yet received an appointment. Using Sorbact we have gradually improve the wound surface with no improvement in surface area. She is approved for Apligraf but the wound surface still is not completely viable. She has not had to  come in for a dressing change 7/8; the patient has an appointment with vein and vascular on 7/31 which is a Friday afternoon. She is concerned about getting back here for [place] to dress her wounds. I think it is important to have them goal for her venous reflux/history of ablations etc. to see if anything else can be done. She apparently tested positive for 1 of the blood tests with regards to lupus and saw a rheumatologist. He has raised the issue of vasculitis again. I have had this thought in the past however the evidence seems overwhelming that this is a venous reflux etiology. If the rheumatologist tells me there is clinical and laboratory investigation is positive for lupus I will rethink this. 7/15; the patient's wound surfaces are quite a bit better. The medial area which was her original wound now has no depth although the lateral wound which was the more recent area actually appears larger. Both with viable surfaces which is indeed better. Using Sorbact. I wanted to use Apligraf on her however there is the issue of the vein and vascular appointment on 7/31 at 2:00 in the afternoon which would not allow her to get back to be rewrapped and they would no doubt remove the graft Objective Constitutional Sitting or standing Blood Pressure is within target range for patient.. Pulse regular and within target range for patient.Marland Kitchen Respirations regular, non-labored and within target range.. Temperature is normal and within the target range for the patient.Marland Kitchen appears in no distress. Vitals Time Taken: 12:48 PM, Height: 63 in, Weight: 224.7 lbs, BMI: 39.8, Temperature: 98.3 F, Pulse: 75 bpm, Respiratory Rate: 16 breaths/min, Blood Pressure: 145/73 mmHg. Cardiovascular Pedal pulses palpable on the  right. Changes of severe venous hypertension. Lymphatic None palpable in the popliteal area bilaterally. Psychiatric Amber, [name] ([id number]) No evidence of depression, anxiety, or agitation. Calm,  cooperative, and communicative. Appropriate interactions and affect.. General Notes: Wound exam; the original wound on the right medial leg is now superficial much better looking surface. The lateral leg wound is larger but does not require debridement and the surface area looks better as well. Integumentary (Hair, Skin) Changes chronic venous hypertension dilated small veins in her feet.. Wound #5 status is Open. Original cause of wound was Gradually Appeared. The wound is located on the Left,Medial Lower Leg. The wound measures 2.8cm length x 2.7cm width x 0.2cm depth; 5.938cm^2 area and 1.188cm^3 volume. There is Fat Layer (Subcutaneous Tissue) Exposed exposed. There is no tunneling or undermining noted. There is a medium amount of purulent drainage noted. The wound margin is indistinct and nonvisible. There is large (67-100%) pink granulation within the wound bed. There is a small (1-33%) amount of necrotic tissue within the wound bed including Adherent Slough. Wound #6 status is Open. Original cause of wound was Gradually Appeared. The wound is located on the Left,Lateral Lower Leg. The wound measures 5cm length x 3.7cm width x 0.5cm depth; 14.53cm^2 area and 7.265cm^3 volume. There is Fat Layer (Subcutaneous Tissue) Exposed exposed. There is no tunneling or undermining noted. There is a medium amount of purulent drainage noted. The wound margin is indistinct and nonvisible. There is medium (34-66%) pink granulation within the wound bed. There is a medium (34-66%) amount of necrotic tissue within the wound bed including Adherent Slough. Assessment Active Problems ICD-10 Non-pressure chronic ulcer of left calf limited to breakdown of skin Chronic venous hypertension (idiopathic) with inflammation of right lower extremity Lymphedema, not elsewhere classified Plan Wound Cleansing: Wound #5 Left,Medial Lower Leg: Clean wound with Normal Saline. May Shower, gently pat wound dry prior to  applying new dressing. Wound #6 Left,Lateral Lower Leg: Clean wound with Normal Saline. May Shower, gently pat wound dry prior to applying new dressing. Anesthetic (add to Medication List): Wound #5 Left,Medial Lower Leg: Topical Lidocaine 4% cream applied to wound bed prior to debridement (In Clinic Only). Benzocaine Topical Anesthetic Spray applied to wound bed prior to debridement (In Clinic Only). Wound #6 Left,Lateral Lower Leg: Topical Lidocaine 4% cream applied to wound bed prior to debridement (In Clinic Only). Benzocaine Topical Anesthetic Spray applied to wound bed prior to debridement (In Clinic Only). Skin Barriers/Peri-Wound Care: Wound #5 Left,Medial Lower Leg: Other: - Antibiotic ointment on irritated areas Wound #6 Left,Lateral Lower Leg: Amber, [name] ([id number]) Other: - Antibiotic ointment on irritated areas Primary Wound Dressing: Wound #5 Left,Medial Lower Leg: Other: - Sorbact Wound #6 Left,Lateral Lower Leg: Other: - Sorbact Secondary Dressing: Wound #5 Left,Medial Lower Leg: XtraSorb Wound #6 Left,Lateral Lower Leg: XtraSorb Dressing Change Frequency: Wound #5 Left,Medial Lower Leg: Change dressing every week Wound #6 Left,Lateral Lower Leg: Change dressing every week Follow-up Appointments: Wound #5 Left,Medial Lower Leg: Return Appointment in 1 week. Nurse Visit as needed Wound #6 Left,Lateral Lower Leg: Return Appointment in 1 week. Nurse Visit as needed Edema Control: Wound #5 Left,Medial Lower Leg: 3 Layer Compression System - Left Lower Extremity Wound #6 Left,Lateral Lower Leg: 3 Layer Compression System - Left Lower Extremity Off-Loading: Wound #5 Left,Medial Lower Leg: Other: - Elevate legs as needed Wound #6 Left,Lateral Lower Leg: Other: - Elevate legs as needed 1. We have good healthy looking surface is finally on both of these wounds  unfortunately largely because of the vein and vascular appointment on 31 July late in the day  we do not have an option for advanced treatment. 2. The patient has a history of chronic venous insufficiency with previous ablations but obvious continued venous hypertension. The question for vascular surgery is there anything further in terms of surgical ablation that would benefit this patient was refractory and recurrent wounds in the left lower leg region. 3. So for now we will continue with Sorbact as the primary dressing she is tolerating this well Electronic Signature(s) Signed: 05/20/2019 4:31:45 PM By: Linton Ham MD Entered By: Linton Ham on 05/20/2019 15:52:04 Hearne, [name] ([id number]) -------------------------------------------------------------------------------- SuperBill Details Patient Name: [name]-ANN, LINDFORS. Date of Service: 05/20/2019 Medical Record Number: [id number] Patient Account Number: [id number] Date of Birth/Sex: 1946-09-23 (73 y.o. F) Treating RN: Cornell Barman Primary Care Provider: [name] Other Clinician: Referring Provider: [name] Treating Provider/Extender: Tito Dine in Treatment: 23 Diagnosis Coding ICD-10 Codes Code Description [phone] Non-pressure chronic ulcer of left calf limited to breakdown of skin I87.321 Chronic venous hypertension (idiopathic) with inflammation of right lower extremity I89.0 Lymphedema, not elsewhere classified Facility Procedures CPT4 Code: [id number] Description: (Facility Use Only) [phone] - [place] LT LEG Modifier: Quantity: 1 Physician Procedures CPT4 Code Description: [id number] - WC PHYS LEVEL 3 - EST PT ICD-10 Diagnosis Description L97.221 Non-pressure chronic ulcer of left calf limited to breakdown I87.321 Chronic venous hypertension (idiopathic) with inflammation of I89.0 Lymphedema, not  elsewhere classified Modifier: of skin right lower extr Quantity: 1 emity Electronic Signature(s) Signed: 05/20/2019 4:31:45 PM By: Linton Ham MD Entered By:  Linton Ham on 05/20/2019 15:54:01

## 2019-05-21 NOTE — Progress Notes (Signed)
JAELIN, DEVINCENTIS ([id number]) Visit Report for 05/20/2019 Arrival Information Details Patient Name: [name], [name]. Date of Service: 05/20/2019 12:45 PM Medical Record Number: [id number] Patient Account Number: [id number] Date of Birth/Sex: [name] 27, 1946 (73 y.o. F) Treating RN: Montey Hora Primary Care Delmos Velaquez: [name] Other Clinician: Referring Nunzio Banet: [name] Treating Ota Ebersole/Extender: Tito Dine in Treatment: 23 Visit Information History Since Last Visit Added or deleted any medications: No Patient Arrived: Ambulatory Any new allergies or adverse reactions: No Arrival Time: 12:40 Had a fall or experienced change in No Accompanied By: self activities of daily living that may affect Transfer Assistance: None risk of falls: Patient Identification Verified: Yes Signs or symptoms of abuse/neglect since last visito No Secondary Verification Process Completed: Yes Hospitalized since last visit: No Implantable device outside of the clinic excluding No cellular tissue based products placed in the center since last visit: Has Dressing in Place as Prescribed: Yes Has Compression in Place as Prescribed: Yes Pain Present Now: No Electronic Signature(s) Signed: 05/20/2019 4:19:48 PM By: Montey Hora Entered By: Montey Hora on 05/20/2019 12:42:01 [name] ([id number]) -------------------------------------------------------------------------------- Lower Extremity Assessment Details Patient Name: [name], [name]. Date of Service: 05/20/2019 12:45 PM Medical Record Number: [id number] Patient Account Number: [id number] Date of Birth/Sex: 04-22-1946 (73 y.o. F) Treating RN: Montey Hora Primary Care Nohelia Valenza: [name] Other Clinician: Referring Sopheap Boehle: [name] Treating Najeeb Uptain/Extender: Tito Dine in Treatment: 23 Edema Assessment Assessed: [Left: No] [Right: No] Edema: [Left: Ye] [Right: s] Calf Left:  Right: Point of Measurement: 31 cm From Medial Instep 41 cm cm Ankle Left: Right: Point of Measurement: 12 cm From Medial Instep 23 cm cm Vascular Assessment Pulses: Dorsalis Pedis Palpable: [Left:Yes] Electronic Signature(s) Signed: 05/20/2019 4:19:48 PM By: Montey Hora Entered By: Montey Hora on 05/20/2019 12:49:49 [name], [name] ([id number]) -------------------------------------------------------------------------------- Multi Wound Chart Details Patient Name: [name], [name]. Date of Service: 05/20/2019 12:45 PM Medical Record Number: [id number] Patient Account Number: [id number] Date of Birth/Sex: 09-Jun-1946 (73 y.o. F) Treating RN: Cornell Barman Primary Care Gerhardt Gleed: [name] Other Clinician: Referring Draven Laine: [name] Treating Chris Narasimhan/Extender: Tito Dine in Treatment: 23 Vital Signs Height(in): 54 Pulse(bpm): 13 Weight(lbs): 224.7 Blood Pressure(mmHg): 145/73 Body Mass Index(BMI): 40 Temperature(F): 98.3 Respiratory Rate 16 (breaths/min): Photos: [N/A:N/A] Wound Location: Left Lower Leg - Medial Left Lower Leg - Lateral N/A Wounding Event: Gradually Appeared Gradually Appeared N/A Primary Etiology: Lymphedema Venous Leg Ulcer N/A Comorbid History: Cataracts, Asthma, Sleep Cataracts, Asthma, Sleep N/A Apnea, Deep Vein Apnea, Deep Vein Thrombosis, Hypertension, Thrombosis, Hypertension, Peripheral Venous Disease, Peripheral Venous Disease, Osteoarthritis, Received Osteoarthritis, Received Chemotherapy, Received Chemotherapy, Received Radiation Radiation Date Acquired: 11/19/2018 01/19/2019 N/A Weeks of Treatment: 23 17 N/A Wound Status: Open Open N/A Measurements L x W x D 2.8x2.7x0.2 5x3.7x0.5 N/A (cm) Area (cm) : 5.938 14.53 N/A Volume (cm) : 1.188 7.265 N/A % Reduction in Area: 30.50% -6504.50% N/A % Reduction in Volume: -38.90% -32922.70% N/A Classification: Full Thickness Without Full Thickness Without N/A Exposed  Support Structures Exposed Support Structures Exudate Amount: Medium Medium N/A Exudate Type: Purulent Purulent N/A Exudate Color: yellow, brown, green yellow, brown, green N/A Wound Margin: Indistinct, nonvisible Indistinct, nonvisible N/A Granulation Amount: Large (67-100%) Medium (34-66%) N/A Granulation Quality: Pink Pink N/A Necrotic Amount: Small (1-33%) Medium (34-66%) N/A Exposed Structures: N/A [name], [name] ([id number]) Fat Layer (Subcutaneous Fat Layer (Subcutaneous Tissue) Exposed: Yes Tissue) Exposed: Yes Fascia: No Fascia: No Tendon: No Tendon: No Muscle: No Muscle: No Joint: No Joint: No  Bone: No Bone: No Epithelialization: None None N/A Treatment Notes Electronic Signature(s) Signed: 05/20/2019 4:31:45 PM By: Linton Ham MD Entered By: Linton Ham on 05/20/2019 15:40:59 [name], [name] ([id number]) -------------------------------------------------------------------------------- Multi-Disciplinary Care Plan Details Patient Name: [name], [name]. Date of Service: 05/20/2019 12:45 PM Medical Record Number: [id number] Patient Account Number: [id number] Date of Birth/Sex: 30-Dec-1945 (73 y.o. F) Treating RN: Cornell Barman Primary Care Tonyia Marschall: [name] Other Clinician: Referring Nik Gorrell: [name] Treating Debria Broecker/Extender: Tito Dine in Treatment: 23 Active Inactive Orientation to the Wound Care Program Nursing Diagnoses: Knowledge deficit related to the wound healing center program Goals: Patient/caregiver will verbalize understanding of the [facility] Date Initiated: 12/10/2018 Target Resolution Date: 01/09/2019 Goal Status: Active Interventions: Provide education on orientation to the wound center Notes: Soft Tissue Infection Nursing Diagnoses: Impaired tissue integrity Goals: Patient's soft tissue infection will resolve Date Initiated: 12/10/2018 Target Resolution Date: 01/09/2019 Goal Status:  Active Interventions: Assess signs and symptoms of infection every visit Notes: Venous Leg Ulcer Nursing Diagnoses: Actual venous Insuffiency (use after diagnosis is confirmed) Goals: Patient will maintain optimal edema control Date Initiated: 12/10/2018 Target Resolution Date: 01/09/2019 Goal Status: Active Interventions: Assess peripheral edema status every visit. [name], [name] ([id number]) Treatment Activities: Therapeutic compression applied : 12/10/2018 Notes: Wound/Skin Impairment Nursing Diagnoses: Impaired tissue integrity Goals: Patient/caregiver will verbalize understanding of skin care regimen Date Initiated: 12/10/2018 Target Resolution Date: 01/09/2019 Goal Status: Active Interventions: Assess ulceration(s) every visit Treatment Activities: Topical wound management initiated : 12/10/2018 Notes: Electronic Signature(s) Signed: 05/20/2019 4:20:22 PM By: [name], [name], RN, CWS, [name], [name] Entered By: [name], [name], RN, CWS, Kim on 05/20/2019 13:19:21 [name], [name] ([id number]) -------------------------------------------------------------------------------- Pain Assessment Details Patient Name: [name], [name]. Date of Service: 05/20/2019 12:45 PM Medical Record Number: [id number] Patient Account Number: [id number] Date of Birth/Sex: 15-Aug-1946 (73 y.o. F) Treating RN: Montey Hora Primary Care Yaretzy Olazabal: [name] Other Clinician: Referring Cerra Eisenhower: [name] Treating Kashlynn Kundert/Extender: Tito Dine in Treatment: 23 Active Problems Location of Pain Severity and Description of Pain Patient Has Paino Yes Site Locations Pain Location: Pain in Ulcers With Dressing Change: Yes Duration of the Pain. Constant / Intermittento Intermittent Rate the pain. Current Pain Level: 5 Pain Management and Medication Current Pain Management: Electronic Signature(s) Signed: 05/20/2019 4:19:48 PM By: Montey Hora Entered By: Montey Hora on 05/20/2019  12:42:19 Huhn, [name] ([id number]) -------------------------------------------------------------------------------- Patient/Caregiver Education Details Patient Name: JARELY, JUNCAJ. Date of Service: 05/20/2019 12:45 PM Medical Record Number: [id number] Patient Account Number: [id number] Date of Birth/Gender: [birth year] (73 y.o. F) Treating RN: Cornell Barman Primary Care Physician: [name] Other Clinician: Referring Physician: [name] Treating Physician/Extender: Tito Dine in Treatment: 39 Education Assessment Education Provided To: Patient Education Topics Provided Venous: Handouts: Controlling Swelling with Multilayered Compression Wraps Methods: Demonstration, Explain/Verbal Responses: State content correctly Electronic Signature(s) Signed: 05/20/2019 4:20:22 PM By: [name], [name], RN, CWS, [name], [name] Entered By: [name], [name], RN, CWS, Kim on 05/20/2019 13:21:01 [name], [name] ([id number]) -------------------------------------------------------------------------------- Wound Assessment Details Patient Name: CHARLESTON, VIERLING. Date of Service: 05/20/2019 12:45 PM Medical Record Number: [id number] Patient Account Number: [id number] Date of Birth/Sex: 1946/07/04 (73 y.o. F) Treating RN: Montey Hora Primary Care Crystalynn Mcinerney: [name] Other Clinician: Referring Cionna Collantes: [name] Treating Coreon Simkins/Extender: Tito Dine in Treatment: 23 Wound Status Wound Number: 5 Primary Lymphedema Etiology: Wound Location: Left Lower Leg - Medial Wound Open Wounding Event: Gradually Appeared Status: Date Acquired: 11/19/2018 Comorbid Cataracts, Asthma, Sleep  Apnea, Deep Vein Weeks Of Treatment: 23 History: Thrombosis, Hypertension, Peripheral Venous Clustered Wound: No Disease, Osteoarthritis, Received Chemotherapy, Received Radiation Photos Wound Measurements Length: (cm) 2.8 Width: (cm) 2.7 Depth: (cm) 0.2 Area: (cm)  5.938 Volume: (cm) 1.188 % Reduction in Area: 30.5% % Reduction in Volume: -38.9% Epithelialization: None Tunneling: No Undermining: No Wound Description Full Thickness Without Exposed Support Classification: Structures Wound Margin: Indistinct, nonvisible Exudate Medium Amount: Exudate Type: Purulent Exudate Color: yellow, brown, green Foul Odor After Cleansing: No Slough/Fibrino Yes Wound Bed Granulation Amount: Large (67-100%) Exposed Structure Granulation Quality: Pink Fascia Exposed: No Necrotic Amount: Small (1-33%) Fat Layer (Subcutaneous Tissue) Exposed: Yes Necrotic Quality: Adherent Slough Tendon Exposed: No Muscle Exposed: No Joint Exposed: No Bone Exposed: No KYSHA, MURALLES ([id number]) Electronic Signature(s) Signed: 05/20/2019 4:19:48 PM By: Montey Hora Entered By: Montey Hora on 05/20/2019 12:56:22 Tennant, [name] ([id number]) -------------------------------------------------------------------------------- Wound Assessment Details Patient Name: CHERYLYNN, LISZEWSKI. Date of Service: 05/20/2019 12:45 PM Medical Record Number: [id number] Patient Account Number: [id number] Date of Birth/Sex: [birth year] (73 y.o. F) Treating RN: Montey Hora Primary Care Suzannah Bettes: [name] Other Clinician: Referring Bensyn Bornemann: [name] Treating Reagann Dolce/Extender: Tito Dine in Treatment: 23 Wound Status Wound Number: 6 Primary Venous Leg Ulcer Etiology: Wound Location: Left Lower Leg - Lateral Wound Open Wounding Event: Gradually Appeared Status: Date Acquired: 01/19/2019 Comorbid Cataracts, Asthma, Sleep Apnea, Deep Vein Weeks Of Treatment: 17 History: Thrombosis, Hypertension, Peripheral Venous Clustered Wound: No Disease, Osteoarthritis, Received Chemotherapy, Received Radiation Photos Wound Measurements Length: (cm) 5 Width: (cm) 3.7 Depth: (cm) 0.5 Area: (cm) 14.53 Volume: (cm) 7.265 % Reduction in Area: -6504.5% %  Reduction in Volume: -32922.7% Epithelialization: None Tunneling: No Undermining: No Wound Description Full Thickness Without Exposed Support Classification: Structures Wound Margin: Indistinct, nonvisible Exudate Medium Amount: Exudate Type: Purulent Exudate Color: yellow, brown, green Foul Odor After Cleansing: No Slough/Fibrino Yes Wound Bed Granulation Amount: Medium (34-66%) Exposed Structure Granulation Quality: Pink Fascia Exposed: No Necrotic Amount: Medium (34-66%) Fat Layer (Subcutaneous Tissue) Exposed: Yes Necrotic Quality: Adherent Slough Tendon Exposed: No Muscle Exposed: No Joint Exposed: No Bone Exposed: No ARIYONNA, TWICHELL ([id number]) Electronic Signature(s) Signed: 05/20/2019 4:19:48 PM By: Montey Hora Entered By: Montey Hora on 05/20/2019 13:00:59 Woodhead, [name] ([id number]) -------------------------------------------------------------------------------- Vitals Details Patient Name: ELISAMA, THISSEN. Date of Service: 05/20/2019 12:45 PM Medical Record Number: [id number] Patient Account Number: [id number] Date of Birth/Sex: 1945-12-09 (73 y.o. F) Treating RN: Montey Hora Primary Care Jamaurie Bernier: [name] Other Clinician: Referring Ahmya Bernick: [name] Treating Jaskirat Schwieger/Extender: Tito Dine in Treatment: 23 Vital Signs Time Taken: 12:48 Temperature (F): 98.3 Height (in): 63 Pulse (bpm): 75 Weight (lbs): 224.7 Respiratory Rate (breaths/min): 16 Body Mass Index (BMI): 39.8 Blood Pressure (mmHg): 145/73 Reference Range: 80 - 120 mg / dl Electronic Signature(s) Signed: 05/20/2019 4:19:48 PM By: Montey Hora Entered By: Montey Hora on 05/20/2019 12:48:54

## 2019-05-22 DIAGNOSIS — G5603 Carpal tunnel syndrome, bilateral upper limbs: Secondary | ICD-10-CM | POA: Diagnosis not present

## 2019-05-27 ENCOUNTER — Encounter: Payer: Medicare Other | Admitting: Internal Medicine

## 2019-05-27 ENCOUNTER — Other Ambulatory Visit: Payer: Self-pay

## 2019-05-27 DIAGNOSIS — I87312 Chronic venous hypertension (idiopathic) with ulcer of left lower extremity: Secondary | ICD-10-CM | POA: Diagnosis not present

## 2019-05-27 DIAGNOSIS — L97222 Non-pressure chronic ulcer of left calf with fat layer exposed: Secondary | ICD-10-CM | POA: Diagnosis not present

## 2019-05-27 DIAGNOSIS — L97221 Non-pressure chronic ulcer of left calf limited to breakdown of skin: Secondary | ICD-10-CM | POA: Diagnosis not present

## 2019-05-27 DIAGNOSIS — S81802A Unspecified open wound, left lower leg, initial encounter: Secondary | ICD-10-CM | POA: Diagnosis not present

## 2019-05-27 DIAGNOSIS — I87332 Chronic venous hypertension (idiopathic) with ulcer and inflammation of left lower extremity: Secondary | ICD-10-CM | POA: Diagnosis not present

## 2019-05-27 DIAGNOSIS — I890 Lymphedema, not elsewhere classified: Secondary | ICD-10-CM | POA: Diagnosis not present

## 2019-05-27 NOTE — Progress Notes (Signed)
[name] ([id number]) Visit Report for 05/27/2019 Debridement Details Patient Name: [name], [name]. Date of Service: 05/27/2019 12:45 PM Medical Record Number: [id number] Patient Account Number: [id number] Date of Birth/Sex: 1945-12-18 (73 y.o. F) Treating RN: Cornell Barman Primary Care Provider: [name] Other Clinician: Referring Provider: [name] Treating Provider/Extender: Tito Dine in Treatment: 24 Debridement Performed for Wound #5 Left,Medial Lower Leg Assessment: Performed By: Physician [name] Dillon, MD Debridement Type: Debridement Level of Consciousness (Pre- Awake and Alert procedure): Pre-procedure Verification/Time Yes - 13:10 Out Taken: Start Time: 13:10 Pain Control: Lidocaine Total Area Debrided (L x W): 3.2 (cm) x 3.2 (cm) = 10.24 (cm) Tissue and other material Viable, Non-Viable, Slough, Subcutaneous, Slough debrided: Level: Skin/Subcutaneous Tissue Debridement Description: Excisional Instrument: Curette Bleeding: Moderate Hemostasis Achieved: Pressure End Time: 13:13 Response to Treatment: Procedure was tolerated well Level of Consciousness Awake and Alert (Post-procedure): Post Debridement Measurements of Total Wound Length: (cm) 3.2 Width: (cm) 3.2 Depth: (cm) 0.2 Volume: (cm) 1.608 Character of Wound/Ulcer Post Debridement: Requires Further Debridement Post Procedure Diagnosis Same as Pre-procedure Electronic Signature(s) Signed: 05/27/2019 4:35:36 PM By: Linton Ham MD Signed: 05/27/2019 4:45:53 PM By: [name], [name], RN, CWS, [name], [name] Entered By: Linton Ham on 05/27/2019 13:44:05 [name], [name] ([id number]) -------------------------------------------------------------------------------- Debridement Details Patient Name: [name], [name]. Date of Service: 05/27/2019 12:45 PM Medical Record Number: [id number] Patient Account Number: [id number] Date of Birth/Sex: 20-Mar-1946 (73 y.o. F) Treating  RN: Cornell Barman Primary Care Provider: [name] Other Clinician: Referring Provider: [name] Treating Provider/Extender: Tito Dine in Treatment: 24 Debridement Performed for Wound #6 Left,Lateral Lower Leg Assessment: Performed By: Physician [name] Dillon, MD Debridement Type: Debridement Severity of Tissue Pre Fat layer exposed Debridement: Level of Consciousness (Pre- Awake and Alert procedure): Pre-procedure Verification/Time Yes - 13:10 Out Taken: Start Time: 13:10 Pain Control: Lidocaine Total Area Debrided (L x W): 5 (cm) x 5.5 (cm) = 27.5 (cm) Tissue and other material Viable, Non-Viable, Slough, Subcutaneous, Slough debrided: Level: Skin/Subcutaneous Tissue Debridement Description: Excisional Instrument: Curette Bleeding: Moderate Hemostasis Achieved: Pressure End Time: 13:13 Response to Treatment: Procedure was tolerated well Level of Consciousness Awake and Alert (Post-procedure): Post Debridement Measurements of Total Wound Length: (cm) 5 Width: (cm) 5.5 Depth: (cm) 0.3 Volume: (cm) 6.48 Character of Wound/Ulcer Post Debridement: Stable Severity of Tissue Post Debridement: Limited to breakdown of skin Post Procedure Diagnosis Same as Pre-procedure Electronic Signature(s) Signed: 05/27/2019 4:35:36 PM By: Linton Ham MD Signed: 05/27/2019 4:45:53 PM By: [name], [name], RN, CWS, [name], [name] Entered By: Linton Ham on 05/27/2019 13:44:15 [name] ([id number]) -------------------------------------------------------------------------------- HPI Details Patient Name: [name], [name]. Date of Service: 05/27/2019 12:45 PM Medical Record Number: [id number] Patient Account Number: [id number] Date of Birth/Sex: August 12, 1946 (73 y.o. F) Treating RN: Cornell Barman Primary Care Provider: [name] Other Clinician: Referring Provider: [name] Treating Provider/Extender: Tito Dine in  Treatment: 24 History of Present Illness HPI Description: Pleasant 73 year old with history of chronic venous insufficiency. No diabetes or peripheral vascular disease. Left ABI 1.29. Questionable history of left lower extremity DVT. She developed a recurrent ulceration on her left lateral calf in December 2015, which she attributes to poor diet and subsequent lower extremity edema. She underwent endovenous laser ablation of her left greater saphenous vein in 2010. She underwent laser ablation of accessory branch of left GSV in April 2016 by Dr. Kellie Simmering at [facility]. She was previously wearing Unna boots, which she tolerated well. Tolerating 2  layer compression and cadexomer iodine. She returns to clinic for follow-up and is without new complaints. She denies any significant pain at this time. She reports persistent pain with pressure. No claudication or ischemic rest pain. No fever or chills. No drainage. READMISSION 11/13/16; this is a 73 year old woman who is not a diabetic. She is here for a review of a painful area on her left medial lower extremity. I note that she was seen here previously last year for wound I believe to be in the same area. At that time she had undergone previously a left greater saphenous vein ablation by Dr. Kellie Simmering and she had a ablation of the anterior accessory branch of the left greater saphenous vein in March 2016. Seeing that the wound actually closed over. In reviewing the history with her today the ulcer in this area has been recurrent. She describes a biopsy of this area in 2009 that only showed stasis physiology. 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 Do not see that she has ever had arterial studies in the left leg. She tells me she has a  follow-up with Dr. Kellie Simmering in roughly 10 days In any case she developed the reopening of this area roughly a month ago. On the background of this she describes rapidly increasing edema which has responded to Lasix 40 mg and metolazone 2.5 mg as well as the patient's lymph massage. She has been told she has both venous insufficiency and lymphedema but she cannot tolerate compression stockings 11/28/16; the patient saw Dr. Kellie Simmering recently. Per the patient he did arterial Dopplers in the office that did not show evidence of arterial insufficiency, per the patient he stated "treat this like an ordinary venous ulcer". She also saw her dermatologist Dr. Ronnald Ramp who felt that this was more of a vascular ulcer. In general things are improving although she arrives today with increasing bilateral lower extremity edema with weeping a deeper fluid through the wound on the left medial leg compatible with some degree of lymphedema 12/04/16; the patient's wound is fully epithelialized but I don't think fully healed. We will do another week of depression with Promogran and TCA however I suspect we'll be able to discharge her next week. This is a very unusual-looking wound which was initially a figure-of-eight type wound lying on its side surrounded by petechial like hemorrhage. She has had venous ablation on this side. She apparently does not have an arterial issue per Dr. Kellie Simmering. She saw her dermatologist thought it was "vascular". Patient is definitely going to need ongoing compression and I talked about this with her today she will go to elastic therapy after she leaves here next week 12/11/16; the patient's wound is not completely closed today. She has surrounding scar tissue and in further discussion with the patient it would appear that she had ulcers in this area in 2009 for a prolonged period of time ultimately requiring a punch biopsy of this area that only showed venous insufficiency. I did not previously  pickup on this part of the history from the patient. 12/18/16; the patient's wound is completely epithelialized. There is no open area here. She has significant bilateral venous insufficiency with secondary lymphedema to a mild-to-moderate degree she does not have compression stockings.. She did not say anything  to me when I was in the room, she told our intake nurse that she was still having pain in this area. This isn't unusual recurrent small open area. She is going to go to elastic therapy to obtain compression stockings. 12/25/16; the patient's wound is fully epithelialized. There is no open area here. The patient describes some continued episodic discomfort in this area medial left calf. However everything looks fine and healed here. She is been to elastic therapy and caught herself 15-20 mmHg stockings, they apparently were having trouble getting 20-30 mm stockings in her size [name], [name] ([id number]) 01/22/17; this is a patient we discharged from the clinic a month ago. She has a recurrent open wound on her medial left calf. She had 15 mm support stockings. I told her I thought she needed 20-30 mm compression stockings. She tells me that she has been ill with hospitalization secondary to asthma and is been found to have severe hypokalemia likely secondary to a combination of Lasix and metolazone. This morning she noted blistering and leaking fluid on the posterior part of her left leg. She called our intake nurse urgently and we was saw her this afternoon. She has not had any real discomfort here. I don't know that she's been wearing any stockings on this leg for at least 2-3 days. ABIs in this clinic were 1.21 on the right and 1.3 on the left. She is previously seen vascular surgery who does not think that there is a peripheral arterial issue. 01/30/17; Patient arrives with no open wound on the left leg. She has been to elastic therapy and obtained 20-56mmhg below knee stockings and she  has one on the right leg today. READMISSION 02/19/18; this Casalino is a now 73 year old patient we've had in this clinic perhaps 3 times before. I had last looked at her from January 07 December 2016 with an area on the medial left leg. We discharged her on 12/25/16 however she had to be readmitted on 01/22/17 with a recurrence. I have in my notes that we discharged her on 20-30 mm stockings although she tells me she was only wearing support hose because she cannot get stockings on predominantly related to her cervical spine surgery/issues. She has had previous ablations done by vein and vascular in [place] including a great saphenous vein ablation on the left with an anterior accessory branch ablation I think both of these were in 2016. On one of the previous visit she had a biopsy noted 2009 that was negative. She is not felt to have an arterial issue. She is not a diabetic. She does have a history of obstructive sleep apnea hypertension asthma as well as chronic venous insufficiency and lymphedema. On this occasion she noted 2 dry scaly patch on her left leg. She tried to put lotion on this it didn't really help. There were 2 open areas.the patient has been seeing her primary physician from 02/05/18 through 02/14/18. She had Unna boots applied. The superior wound now on the lateral left leg has closed but she's had one wound that remains open on the lateral left leg. This is not the same spot as we dealt with in 2018. ABIs in this clinic were 1.3 bilaterally 02/26/18; patient has a small wound on the left lateral calf. Dimensions are down. She has chronic venous insufficiency and lymphedema. 03/05/18; small open area on the left lateral calf. Dimensions are down. Tightly adherent necrotic debris over the surface of the wound which was difficult to remove. Also  the dressing [over collagen] stuck to the wound surface. This was removed with some difficulty as well. Change the primary dressing to Hydrofera  Blue ready 03/12/18; small open area on the left lateral calf. Comes in with tightly adherent surface eschar as well as some adherent Hydrofera Blue. 03/19/18; open area on the left lateral calf. Again adherent surface eschar as well as some adherent Hydrofera Blue nonviable subcutaneous tissue. She complained of pain all week even with the reduction from 4-3 layer compression I put on last week. Also she had an increase in her ankle and calf measurements probably related to the same thing. 03/26/18; open area on the left lateral calf. A very small open area remains here. We used silver alginate starting last week as the Hydrofera Blue seem to stick to the wound bed. In using 4-layer compression 04/02/18; the open area in the left lateral calf at some adherent slough which I removed there is no open area here. We are able to transition her into her own compression stocking. Truthfully I think this is probably his support hose. However this does not maintain skin integrity will be limited. She cannot put over the toe compression stockings on because of neck problems hand problems etc. She is allergic to the lining layer of juxta lites. We might be forced to use extremitease stocking should this fail READMIT 11/24/2018 Patient is now a 73 year old woman who is not a diabetic. She has been in this clinic on at least 3 previous occasions largely with recurrent wounds on her left leg secondary to chronic venous insufficiency with secondary lymphedema. Her situation is complicated by inability to get stockings on and an allergy to neoprene which is apparently a component and at least juxta lites and other stockings. As a result she really has not been wearing any stockings on her legs. She tells [place] that roughly 2 or 3 weeks ago she started noticing a stinging sensation just above her ankle on the left medial aspect. She has been diagnosed with pseudogout and she wondered whether this was what she was  experiencing. She tried to dress this with something she bought at the store however subsequently it pulled skin off and now she has an open wound that is not improving. She has been using Vaseline gauze with a cover bandage. She saw her primary doctor last week who put an Haematologist on her. ABIs in this clinic was 1.03 on the left [name], [name]. ([id number]) 2/12; the area is on the left medial ankle. Odd-looking wound with what looks to be surface epithelialization but a multitude of small petechial openings. This clearly not closed yet. We have been using silver alginate under 3 layer compression with TCA 2/19; the wound area did not look quite as good this week. Necrotic debris over the majority of the wound surface which required debridement. She continues to have a multitude of what looked to be small petechial openings. She reminds [place] that she had a biopsy on this initially during her first outbreak in 2015 in [place] [facility]. She expresses concern about this being a possible melanoma. 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 I assured her that this does not look anything like melanoma. Except for the petechial reaction it does look like a venous insufficiency area and she certainly has evidence of this on both sides 2/26; a difficult area on the left medial ankle. The patient clearly  has chronic venous hypertension with some degree of lymphedema. The odd thing about the area is the small petechial hemorrhages. I am not really sure how to explain this. This was present last time and this is not a compression injury. We have been using Hydrofera Blue which I changed to last week 3/4; still using Hydrofera Blue. Aggressive debridement today. She does not have known arterial issues. She has seen Dr. Kellie Simmering at [facility] vein and vascular and and has an ablation on the left. [Anterior accessory  branch of the greater saphenous]. From what I remember they did not feel she had an arterial issue. The patient has had this area biopsied in 2009 at [facility] dermatology and by her recollection they said this was "stasis". She is also follow-up with dermatology locally who thought that this was more of a vascular issue 3/11; using Hydrofera Blue. Aggressive debridement today. She does not have an arterial issue. We are using 3 layer compression although we may need to go to 4. The patient has been in for multiple changes to her wrap since I last saw her a week ago. She says that the area was leaking. I do not have too much more information on what was found 01/19/19 on evaluation today patient was actually being seen for a nurse visit when unfortunately she had the area on her left lateral lower extremity as well as weeping from the right lower extremity that became apparent. Therefore we did end up actually seeing her for a full visit with myself. She is having some pain at this site as well but fortunately nothing too significant at this point. No fevers, chills, nausea, or vomiting noted at this time. 3/18-Patient is back to the clinic with the left leg venous leg ulcer, the ulcer is larger in size, has a surface that is densely adherent with fibrinous tissue, the Hydrofera Blue was used but is densely adherent and there was difficulty in removing it. The right lower extremity was also wrapped for weeping edema. Patient has a new area over the left lateral foot above the malleolus that is small and appears to have no debris with intact surrounding skin. Patient is on increased dose of Lasix also as a means to edema management 3/25; the patient has a nonhealing venous ulcer on the medial left leg and last week developed a smaller area on the lateral left calf. We have been using Hydrofera Blue with a contact layer. 4/1; no major change in these wounds areas. Left medial and more recently left  lateral calf. I tried Iodoflex last week to aid in debridement she did not tolerate this. She stated her pain was terrible all week. She took the top layer of the 4 layer compression off. 4/8; the patient actually looks somewhat better in terms of her more prominent left lateral calf wound. There is some healthy looking tissue here. She is still complaining of a lot of discomfort. 4/15; patient in a lot of pain secondary to sciatica. She is on a prednisone taper prescribed by her primary physician. She has the 2 areas one on the left medial and more recently a smaller area on the left lateral calf. Both of these just above the malleoli 4/22; her back pain is better but she still states she is very uncomfortable and now feels she is intolerant to the [employer]. No real change in the wounds we have been using Sorbact. She has been previously intolerant to Iodoflex. There is not a lot of  option about what we can use to debride this wound under compression that she no doubt needs. sHe states Ultram no longer works for her pain 4/29; no major change in the wounds slightly increased depth. Surface on the original medial wound perhaps somewhat improved however the more recent area on the lateral left ankle is 100% covered in very adherent debris we have been using Sorbact. She tolerates 4 layer compression well and her edema control is a lot better. She has not had to come in for a nurse check 5/6; no major change in the condition of the wounds. She did consent to debridement today which was done with some difficulty. Continuing Sorbact. She did not tolerate Iodoflex. She was in for a check of her compression the day after we wrapped her last week this was adjusted but nothing much was found 5/13; no major change in the condition or area of the wounds. I was able to get a fairly aggressive debridement done on the lateral left leg wound. Even using Sorbact under compression. She came back in on Friday to  have the wrap changed. She says she felt uncomfortable on the lateral aspect of her ankle. She has a long history of chronic venous insufficiency including previous ablation surgery on this side. 5/20-Patient returns for wounds on left leg with both wounds covered in slough, with the lateral leg wound larger in size, she has been in 3 layer compression and felt more comfortable, she describes pain in ankle, in leg and pins and needles in foot, and is about to try Pamelor for this 6/3; wounds on the left lateral and left medial leg. The area medially which is the most recent of the 2 seems to have had the largest increase in dimensions. We have been using Sorbac to try and debride the surface. She has been to see orthopedics they apparently did a plain x-ray that was indeterminant. Diagnosed her with neuropathy and they have ordered an MRI to MEGGIN, OLA. ([id number]) determine if there is underlying osteomyelitis. This was not high on my thought list but I suppose it is prudent. We have advised her to make an appointment with vein and vascular in [place]. She has a history of a left greater saphenous and accessory vein ablations I wonder if there is anything else that can be done from a surgical point of view to help in these difficult refractory wounds. We have previously healed this wound on one occasion but it keeps on reopening [medial side] 6/10; deep tissue culture I did last week I think on the left medial wound showed both moderate E. coli and moderate staph aureus [MSSA]. She is going to require antibiotics and I have chosen Augmentin. We have been using Sorbact and we have made better looking wound surface on both sides but certainly no improvement in wound area. She was back in last Friday apparently for a dressing changes the wrap was hurting her outer left ankle. She has not managed to get a hold of vein and vascular in [place]. We are going to have to make her that  appointment 6/17; patient is tolerating the Augmentin. She had an MRI that I think was ordered by orthopedic surgeon this did not show osteomyelitis or an abscess did suggest cellulitis. We have been using Sorbact to the lateral and medial ankles. We have been trying to arrange a follow-up appointment with vein and vascular in [place] or did her original ablations. We apparently an area sent the request  to vein and vascular in [facility] 6/24; patient has completed the Augmentin. We do not yet have a vein and vascular appointment in [place]. I am not sure what the issue is here we have asked her to call tomorrow. We are using Sorbact. Making some improvements and especially the medial wound. Both surfaces however look better medial and lateral. 7/1; the patient has been in contact with vein and vascular in [place] but has not yet received an appointment. Using Sorbact we have gradually improve the wound surface with no improvement in surface area. She is approved for Apligraf but the wound surface still is not completely viable. She has not had to come in for a dressing change 7/8; the patient has an appointment with vein and vascular on 7/31 which is a Friday afternoon. She is concerned about getting back here for [place] to dress her wounds. I think it is important to have them goal for her venous reflux/history of ablations etc. to see if anything else can be done. She apparently tested positive for 1 of the blood tests with regards to lupus and saw a rheumatologist. He has raised the issue of vasculitis again. I have had this thought in the past however the evidence seems overwhelming that this is a venous reflux etiology. If the rheumatologist tells me there is clinical and laboratory investigation is positive for lupus I will rethink this. 7/15; the patient's wound surfaces are quite a bit better. The medial area which was her original wound now has no depth although the lateral wound  which was the more recent area actually appears larger. Both with viable surfaces which is indeed better. Using Sorbact. I wanted to use Apligraf on her however there is the issue of the vein and vascular appointment on 7/31 at 2:00 in the afternoon which would not allow her to get back to be rewrapped and they would no doubt remove the graft 7/22; the patient's wound surfaces have moderate amount of debris although generally look better. The lateral one is larger with 2 small satellite areas superiorly. We are waiting for her vein and vascular appointment on 7/31. She has been approved for Apligraf which I would like to use after that Electronic Signature(s) Signed: 05/27/2019 4:35:36 PM By: Linton Ham MD Entered By: Linton Ham on 05/27/2019 [name], [name] ([id number]) -------------------------------------------------------------------------------- Physical Exam Details Patient Name: [name], [name]. Date of Service: 05/27/2019 12:45 PM Medical Record Number: [id number] Patient Account Number: [id number] Date of Birth/Sex: July 29, 1946 (73 y.o. F) Treating RN: Cornell Barman Primary Care Provider: [name] Other Clinician: Referring Provider: [name] Treating Provider/Extender: Tito Dine in Treatment: 24 Constitutional Sitting or standing Blood Pressure is within target range for patient.. Pulse regular and within target range for patient.Marland Kitchen Respirations regular, non-labored and within target range.. Temperature is normal and within the target range for the patient.Marland Kitchen appears in no distress. Eyes Conjunctivae clear. No discharge. Respiratory Respiratory effort is easy and symmetric bilaterally. Rate is normal at rest and on room air.. Cardiovascular Pedal pulses palpable on the right. Integumentary (Hair, Skin) No evidence of infection. Psychiatric No evidence of depression, anxiety, or agitation. Calm, cooperative, and communicative.  Appropriate interactions and affect.. Notes Wound exam; the original wound on the right medial leg is now superficial. Covered in adherent debris debrided with a #5 curette to a reasonably healthy looking surface. The area laterally is larger with 2 small satellite lesions. Signs of severe venous hypertension dilated small venules in her foot and  ankle Electronic Signature(s) Signed: 05/27/2019 4:35:36 PM By: Linton Ham MD Entered By: Linton Ham on 05/27/2019 13:48:39 Villard, [name] ([id number]) -------------------------------------------------------------------------------- Physician Orders Details Patient Name: [name], [name]. Date of Service: 05/27/2019 12:45 PM Medical Record Number: [id number] Patient Account Number: [id number] Date of Birth/Sex: 08-26-1946 (72 y.o. F) Treating RN: Cornell Barman Primary Care Provider: [name] Other Clinician: Referring Provider: [name] Treating Provider/Extender: Tito Dine in Treatment: 24 Verbal / Phone Orders: No Diagnosis Coding Wound Cleansing Wound #5 Left,Medial Lower Leg o Clean wound with Normal Saline. o May Shower, gently pat wound dry prior to applying new dressing. Wound #6 Left,Lateral Lower Leg o Clean wound with Normal Saline. o May Shower, gently pat wound dry prior to applying new dressing. Anesthetic (add to Medication List) Wound #5 Left,Medial Lower Leg o Topical Lidocaine 4% cream applied to wound bed prior to debridement (In Clinic Only). o Benzocaine Topical Anesthetic Spray applied to wound bed prior to debridement (In Clinic Only). Wound #6 Left,Lateral Lower Leg o Topical Lidocaine 4% cream applied to wound bed prior to debridement (In Clinic Only). o Benzocaine Topical Anesthetic Spray applied to wound bed prior to debridement (In Clinic Only). Skin Barriers/Peri-Wound Care Wound #5 Left,Medial Lower Leg o Other: - Antibiotic ointment on irritated  areas Wound #6 Left,Lateral Lower Leg o Other: - Antibiotic ointment on irritated areas Primary Wound Dressing Wound #5 Left,Medial Lower Leg o Other: - Sorbact Wound #6 Left,Lateral Lower Leg o Other: - Sorbact Secondary Dressing Wound #5 Left,Medial Lower Leg o XtraSorb Wound #6 Left,Lateral Lower Leg o XtraSorb Dressing Change Frequency Wound #5 Left,Medial Lower Leg o Change dressing every week [name], [name] ([id number]) Wound #6 Left,Lateral Lower Leg o Change dressing every week Follow-up Appointments Wound #5 Left,Medial Lower Leg o Return Appointment in 1 week. o Nurse Visit as needed Wound #6 Left,Lateral Lower Leg o Return Appointment in 1 week. o Nurse Visit as needed Edema Control Wound #5 Left,Medial Lower Leg o 3 Layer Compression System - Left Lower Extremity Wound #6 Left,Lateral Lower Leg o 3 Layer Compression System - Left Lower Extremity Off-Loading Wound #5 Left,Medial Lower Leg o Other: - Elevate legs as needed Wound #6 Left,Lateral Lower Leg o Other: - Elevate legs as needed Electronic Signature(s) Signed: 05/27/2019 4:35:36 PM By: Linton Ham MD Signed: 05/27/2019 4:45:53 PM By: [name], [name], RN, CWS, [name], [name] Entered By: [name], [name], RN, CWS, Kim on 05/27/2019 13:13:06 WANDRA, BABIN ([id number]) -------------------------------------------------------------------------------- Problem List Details Patient Name: MISSY, BAKSH. Date of Service: 05/27/2019 12:45 PM Medical Record Number: [id number] Patient Account Number: [id number] Date of Birth/Sex: [birth year] (73 y.o. F) Treating RN: Cornell Barman Primary Care Provider: [name] Other Clinician: Referring Provider: [name] Treating Provider/Extender: Tito Dine in Treatment: 24 Active Problems ICD-10 Evaluated Encounter Code Description Active Date Today Diagnosis L97.221 Non-pressure chronic ulcer of left calf limited to  breakdown of 01/07/2019 No Yes skin I87.321 Chronic venous hypertension (idiopathic) with inflammation of 12/10/2018 No Yes right lower extremity I89.0 Lymphedema, not elsewhere classified 12/10/2018 No Yes Inactive Problems Resolved Problems Electronic Signature(s) Signed: 05/27/2019 4:35:36 PM By: Linton Ham MD Entered By: Linton Ham on 05/27/2019 13:42:26 [name], [name] ([id number]) -------------------------------------------------------------------------------- Progress Note Details Patient Name: [name], [name]. Date of Service: 05/27/2019 12:45 PM Medical Record Number: [id number] Patient Account Number: [id number] Date of Birth/Sex: Jun 05, 1946 (73 y.o. F) Treating RN: Cornell Barman Primary Care Provider: [name] Other Clinician: Referring Provider: [name] Treating Provider/Extender:  Lea Baine G Weeks in Treatment: 24 Subjective History of Present Illness (HPI) Pleasant 73 year old with history of chronic venous insufficiency. No diabetes or peripheral vascular disease. Left ABI 1.29. Questionable history of left lower extremity DVT. She developed a recurrent ulceration on her left lateral calf in December 2015, which she attributes to poor diet and subsequent lower extremity edema. She underwent endovenous laser ablation of her left greater saphenous vein in 2010. She underwent laser ablation of accessory branch of left GSV in April 2016 by Dr. Kellie Simmering at [facility]. She was previously wearing Unna boots, which she tolerated well. Tolerating 2 layer compression and cadexomer iodine. She returns to clinic for follow-up and is without new complaints. She denies any significant pain at this time. She reports persistent pain with pressure. No claudication or ischemic rest pain. No fever or chills. No drainage. READMISSION 11/13/16; this is a 73 year old woman who is not a diabetic. She is here for a review of a painful area on her left medial  lower extremity. I note that she was seen here previously last year for wound I believe to be in the same area. At that time she had undergone previously a left greater saphenous vein ablation by Dr. Kellie Simmering and she had a ablation of the anterior accessory branch of the left greater saphenous vein in March 2016. Seeing that the wound actually closed over. In reviewing the history with her today the ulcer in this area has been recurrent. She describes a biopsy of this area in 2009 that only showed stasis physiology. 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 Do not see that she has ever had arterial studies in the left leg. She tells me she has a follow-up with Dr. Kellie Simmering in roughly 10 days In any case she developed the reopening of this area roughly a month ago. On the background of this she describes rapidly increasing edema which has responded to Lasix 40 mg and metolazone 2.5 mg as well as the patient's lymph massage. She has been told she has both venous insufficiency and lymphedema but she cannot tolerate compression stockings 11/28/16; the patient saw Dr. Kellie Simmering recently. Per the patient he did arterial Dopplers in the office that did not show evidence of arterial insufficiency, per the patient he stated "treat this like an ordinary venous ulcer". She also saw her dermatologist Dr. Ronnald Ramp who felt that this was more of a vascular ulcer. In general things are improving although she arrives today with increasing bilateral lower extremity edema with weeping a deeper fluid through the wound on the left medial leg compatible with some degree of lymphedema 12/04/16; the patient's wound is fully epithelialized but I don't think fully healed. We will do another week of depression  with Promogran and TCA however I suspect we'll be able to discharge her next week. This is a very unusual-looking wound which was initially a figure-of-eight type wound lying on its side surrounded by petechial like hemorrhage. She has had venous ablation on this side. She apparently does not have an arterial issue per Dr. Kellie Simmering. She saw her dermatologist thought it was "vascular". Patient is definitely going to need ongoing compression and I talked about this with her today she will go to elastic therapy after she leaves here  next week 12/11/16; the patient's wound is not completely closed today. She has surrounding scar tissue and in further discussion with the patient it would appear that she had ulcers in this area in 2009 for a prolonged period of time ultimately requiring a punch biopsy of this area that only showed venous insufficiency. I did not previously pickup on this part of the history from the patient. 12/18/16; the patient's wound is completely epithelialized. There is no open area here. She has significant bilateral venous insufficiency with secondary lymphedema to a mild-to-moderate degree she does not have compression stockings.. She did not say anything to me when I was in the room, she told our intake nurse that she was still having pain in this area. This isn't unusual recurrent small open area. She is going to go to elastic therapy to obtain compression stockings. 12/25/16; the patient's wound is fully epithelialized. There is no open area here. The patient describes some continued episodic discomfort in this area medial left calf. However everything looks fine and healed here. She is been to elastic therapy and JAIYAH, BEINING ([id number]) caught herself 15-20 mmHg stockings, they apparently were having trouble getting 20-30 mm stockings in her size 01/22/17; this is a patient we discharged from the clinic a month ago. She has a recurrent open wound on her medial left calf. She  had 15 mm support stockings. I told her I thought she needed 20-30 mm compression stockings. She tells me that she has been ill with hospitalization secondary to asthma and is been found to have severe hypokalemia likely secondary to a combination of Lasix and metolazone. This morning she noted blistering and leaking fluid on the posterior part of her left leg. She called our intake nurse urgently and we was saw her this afternoon. She has not had any real discomfort here. I don't know that she's been wearing any stockings on this leg for at least 2-3 days. ABIs in this clinic were 1.21 on the right and 1.3 on the left. She is previously seen vascular surgery who does not think that there is a peripheral arterial issue. 01/30/17; Patient arrives with no open wound on the left leg. She has been to elastic therapy and obtained 20-68mmhg below knee stockings and she has one on the right leg today. READMISSION 02/19/18; this Robel is a now 73 year old patient we've had in this clinic perhaps 3 times before. I had last looked at her from January 07 December 2016 with an area on the medial left leg. We discharged her on 12/25/16 however she had to be readmitted on 01/22/17 with a recurrence. I have in my notes that we discharged her on 20-30 mm stockings although she tells me she was only wearing support hose because she cannot get stockings on predominantly related to her cervical spine surgery/issues. She has had previous ablations done by vein and vascular in [place] including a great saphenous vein ablation on the left with an anterior accessory branch ablation I think both of these were in 2016. On one of the previous visit she had a biopsy noted 2009 that was negative. She is not felt to have an arterial issue. She is not a diabetic. She does have a history of obstructive sleep apnea hypertension asthma as well as chronic venous insufficiency and lymphedema. On this occasion she noted 2 dry scaly  patch on her left leg. She tried to put lotion on this it didn't really help. There were 2 open areas.the patient has  been seeing her primary physician from 02/05/18 through 02/14/18. She had Unna boots applied. The superior wound now on the lateral left leg has closed but she's had one wound that remains open on the lateral left leg. This is not the same spot as we dealt with in 2018. ABIs in this clinic were 1.3 bilaterally 02/26/18; patient has a small wound on the left lateral calf. Dimensions are down. She has chronic venous insufficiency and lymphedema. 03/05/18; small open area on the left lateral calf. Dimensions are down. Tightly adherent necrotic debris over the surface of the wound which was difficult to remove. Also the dressing [over collagen] stuck to the wound surface. This was removed with some difficulty as well. Change the primary dressing to Hydrofera Blue ready 03/12/18; small open area on the left lateral calf. Comes in with tightly adherent surface eschar as well as some adherent Hydrofera Blue. 03/19/18; open area on the left lateral calf. Again adherent surface eschar as well as some adherent Hydrofera Blue nonviable subcutaneous tissue. She complained of pain all week even with the reduction from 4-3 layer compression I put on last week. Also she had an increase in her ankle and calf measurements probably related to the same thing. 03/26/18; open area on the left lateral calf. A very small open area remains here. We used silver alginate starting last week as the Hydrofera Blue seem to stick to the wound bed. In using 4-layer compression 04/02/18; the open area in the left lateral calf at some adherent slough which I removed there is no open area here. We are able to transition her into her own compression stocking. Truthfully I think this is probably his support hose. However this does not maintain skin integrity will be limited. She cannot put over the toe compression stockings on  because of neck problems hand problems etc. She is allergic to the lining layer of juxta lites. We might be forced to use extremitease stocking should this fail READMIT 11/24/2018 Patient is now a 73 year old woman who is not a diabetic. She has been in this clinic on at least 3 previous occasions largely with recurrent wounds on her left leg secondary to chronic venous insufficiency with secondary lymphedema. Her situation is complicated by inability to get stockings on and an allergy to neoprene which is apparently a component and at least juxta lites and other stockings. As a result she really has not been wearing any stockings on her legs. She tells [place] that roughly 2 or 3 weeks ago she started noticing a stinging sensation just above her ankle on the left medial aspect. She has been diagnosed with pseudogout and she wondered whether this was what she was experiencing. She tried to dress this with something she bought at the store however subsequently it pulled skin off and now she has an open wound that is not improving. She has been using Vaseline gauze with a cover bandage. She saw her primary doctor last week who put an Haematologist on her. ABIs in this clinic was 1.03 on the left ZORAYA, FIORENZA. ([id number]) 2/12; the area is on the left medial ankle. Odd-looking wound with what looks to be surface epithelialization but a multitude of small petechial openings. This clearly not closed yet. We have been using silver alginate under 3 layer compression with TCA 2/19; the wound area did not look quite as good this week. Necrotic debris over the majority of the wound surface which required debridement. She continues to have a  multitude of what looked to be small petechial openings. She reminds [place] that she had a biopsy on this initially during her first outbreak in 2015 in [place] dermatology. She expresses concern about this being a possible melanoma. 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 I assured her that this does not look anything like melanoma. Except for the petechial reaction it does look like a venous insufficiency area and she certainly has evidence of this on both sides 2/26; a difficult area on the left medial ankle. The patient clearly has chronic venous hypertension with some degree of lymphedema. The odd thing about the area is the small petechial hemorrhages. I am not really sure how to explain this. This was present last time and this is not a compression injury. We have been using Hydrofera Blue which I changed to last week 3/4; still using Hydrofera Blue. Aggressive debridement today. She does not have known arterial issues. She has seen Dr. Kellie Simmering at [facility] vein [facility] vascular and and has an ablation on the left. [Anterior accessory branch of the greater saphenous]. From what I remember they did not feel she had an arterial issue. The patient has had this area biopsied in 2009 at [facility] dermatology and by her recollection they said this was "stasis". She is also follow-up with dermatology locally who thought that this was more of a vascular issue 3/11; using Hydrofera Blue. Aggressive debridement today. She does not have an arterial issue. We are using 3 layer compression although we may need to go to 4. The patient has been in for multiple changes to her wrap since I last saw her a week ago. She says that the area was leaking. I do not have too much more information on what was found 01/19/19 on evaluation today patient was actually being seen for a nurse visit when unfortunately she had the area on her left lateral lower extremity as well as weeping from the right lower extremity that became apparent. Therefore we did end up actually seeing her for a full visit with myself. She is having some pain at this site as well but fortunately nothing too significant at this point.  No fevers, chills, nausea, or vomiting noted at this time. 3/18-Patient is back to the clinic with the left leg venous leg ulcer, the ulcer is larger in size, has a surface that is densely adherent with fibrinous tissue, the Hydrofera Blue was used but is densely adherent and there was difficulty in removing it. The right lower extremity was also wrapped for weeping edema. Patient has a new area over the left lateral foot above the malleolus that is small and appears to have no debris with intact surrounding skin. Patient is on increased dose of Lasix also as a means to edema management 3/25; the patient has a nonhealing venous ulcer on the medial left leg and last week developed a smaller area on the lateral left calf. We have been using Hydrofera Blue with a contact layer. 4/1; no major change in these wounds areas. Left medial and more recently left lateral calf. I tried Iodoflex last week to aid in debridement she did not tolerate this. She stated her pain was terrible all week. She took the top layer of the 4 layer compression off. 4/8; the patient actually looks somewhat better in terms of her more prominent left lateral calf wound. There is some healthy looking tissue here. She  is still complaining of a lot of discomfort. 4/15; patient in a lot of pain secondary to sciatica. She is on a prednisone taper prescribed by her primary physician. She has the 2 areas one on the left medial and more recently a smaller area on the left lateral calf. Both of these just above the malleoli 4/22; her back pain is better but she still states she is very uncomfortable and now feels she is intolerant to the [employer]. No real change in the wounds we have been using Sorbact. She has been previously intolerant to Iodoflex. There is not a lot of option about what we can use to debride this wound under compression that she no doubt needs. sHe states Ultram no longer works for her pain 4/29; no major change in  the wounds slightly increased depth. Surface on the original medial wound perhaps somewhat improved however the more recent area on the lateral left ankle is 100% covered in very adherent debris we have been using Sorbact. She tolerates 4 layer compression well and her edema control is a lot better. She has not had to come in for a nurse check 5/6; no major change in the condition of the wounds. She did consent to debridement today which was done with some difficulty. Continuing Sorbact. She did not tolerate Iodoflex. She was in for a check of her compression the day after we wrapped her last week this was adjusted but nothing much was found 5/13; no major change in the condition or area of the wounds. I was able to get a fairly aggressive debridement done on the lateral left leg wound. Even using Sorbact under compression. She came back in on Friday to have the wrap changed. She says she felt uncomfortable on the lateral aspect of her ankle. She has a long history of chronic venous insufficiency including previous ablation surgery on this side. 5/20-Patient returns for wounds on left leg with both wounds covered in slough, with the lateral leg wound larger in size, she has been in 3 layer compression and felt more comfortable, she describes pain in ankle, in leg and pins and needles in foot, and is about to try Pamelor for this 6/3; wounds on the left lateral and left medial leg. The area medially which is the most recent of the 2 seems to have had the largest increase in dimensions. We have been using Sorbac to try and debride the surface. She has been to see orthopedics NEITA, LANDRIGAN ([id number]) they apparently did a plain x-ray that was indeterminant. Diagnosed her with neuropathy and they have ordered an MRI to determine if there is underlying osteomyelitis. This was not high on my thought list but I suppose it is prudent. We have advised her to make an appointment with vein and vascular  in [place]. She has a history of a left greater saphenous and accessory vein ablations I wonder if there is anything else that can be done from a surgical point of view to help in these difficult refractory wounds. We have previously healed this wound on one occasion but it keeps on reopening [medial side] 6/10; deep tissue culture I did last week I think on the left medial wound showed both moderate E. coli and moderate staph aureus [MSSA]. She is going to require antibiotics and I have chosen Augmentin. We have been using Sorbact and we have made better looking wound surface on both sides but certainly no improvement in wound area. She was back in last  Friday apparently for a dressing changes the wrap was hurting her outer left ankle. She has not managed to get a hold of vein and vascular in [place]. We are going to have to make her that appointment 6/17; patient is tolerating the Augmentin. She had an MRI that I think was ordered by orthopedic surgeon this did not show osteomyelitis or an abscess did suggest cellulitis. We have been using Sorbact to the lateral and medial ankles. We have been trying to arrange a follow-up appointment with vein and vascular in [place] or did her original ablations. We apparently an area sent the request to vein and vascular in [facility] 6/24; patient has completed the Augmentin. We do not yet have a vein and vascular appointment in [place]. I am not sure what the issue is here we have asked her to call tomorrow. We are using Sorbact. Making some improvements and especially the medial wound. Both surfaces however look better medial and lateral. 7/1; the patient has been in contact with vein and vascular in [place] but has not yet received an appointment. Using Sorbact we have gradually improve the wound surface with no improvement in surface area. She is approved for Apligraf but the wound surface still is not completely viable. She has not had to  come in for a dressing change 7/8; the patient has an appointment with vein and vascular on 7/31 which is a Friday afternoon. She is concerned about getting back here for [place] to dress her wounds. I think it is important to have them goal for her venous reflux/history of ablations etc. to see if anything else can be done. She apparently tested positive for 1 of the blood tests with regards to lupus and saw a rheumatologist. He has raised the issue of vasculitis again. I have had this thought in the past however the evidence seems overwhelming that this is a venous reflux etiology. If the rheumatologist tells me there is clinical and laboratory investigation is positive for lupus I will rethink this. 7/15; the patient's wound surfaces are quite a bit better. The medial area which was her original wound now has no depth although the lateral wound which was the more recent area actually appears larger. Both with viable surfaces which is indeed better. Using Sorbact. I wanted to use Apligraf on her however there is the issue of the vein and vascular appointment on 7/31 at 2:00 in the afternoon which would not allow her to get back to be rewrapped and they would no doubt remove the graft 7/22; the patient's wound surfaces have moderate amount of debris although generally look better. The lateral one is larger with 2 small satellite areas superiorly. We are waiting for her vein and vascular appointment on 7/31. She has been approved for Apligraf which I would like to use after that Objective Constitutional Sitting or standing Blood Pressure is within target range for patient.. Pulse regular and within target range for patient.Marland Kitchen Respirations regular, non-labored and within target range.. Temperature is normal and within the target range for the patient.Marland Kitchen appears in no distress. Vitals Time Taken: 12:45 PM, Height: 63 in, Weight: 224.7 lbs, BMI: 39.8, Temperature: 98.3 F, Pulse: 88 bpm, Respiratory Rate:  18 breaths/min, Blood Pressure: 140/73 mmHg. Eyes Conjunctivae clear. No discharge. Respiratory RAVINA, MILNER ([id number]) Respiratory effort is easy and symmetric bilaterally. Rate is normal at rest and on room air.. Cardiovascular Pedal pulses palpable on the right. Psychiatric No evidence of depression, anxiety, or agitation. Calm, cooperative, and communicative.  Appropriate interactions and affect.. General Notes: Wound exam; the original wound on the right medial leg is now superficial. Covered in adherent debris debrided with a #5 curette to a reasonably healthy looking surface. The area laterally is larger with 2 small satellite lesions. Signs of severe venous hypertension dilated small venules in her foot and ankle Integumentary (Hair, Skin) No evidence of infection. Wound #5 status is Open. Original cause of wound was Gradually Appeared. The wound is located on the Left,Medial Lower Leg. The wound measures 3.2cm length x 3.2cm width x 0.2cm depth; 8.042cm^2 area and 1.608cm^3 volume. There is Fat Layer (Subcutaneous Tissue) Exposed exposed. There is no tunneling or undermining noted. There is a medium amount of purulent drainage noted. The wound margin is indistinct and nonvisible. There is large (67-100%) pink granulation within the wound bed. There is a small (1-33%) amount of necrotic tissue within the wound bed including Adherent Slough. Wound #6 status is Open. Original cause of wound was Gradually Appeared. The wound is located on the Left,Lateral Lower Leg. The wound measures 5cm length x 5.5cm width x 0.2cm depth; 21.598cm^2 area and 4.32cm^3 volume. There is Fat Layer (Subcutaneous Tissue) Exposed exposed. There is no tunneling or undermining noted. There is a medium amount of purulent drainage noted. The wound margin is indistinct and nonvisible. There is medium (34-66%) pink granulation within the wound bed. There is a medium (34-66%) amount of necrotic tissue within the  wound bed including Adherent Slough. Assessment Active Problems ICD-10 Non-pressure chronic ulcer of left calf limited to breakdown of skin Chronic venous hypertension (idiopathic) with inflammation of right lower extremity Lymphedema, not elsewhere classified Procedures Wound #5 Pre-procedure diagnosis of Wound #5 is a Lymphedema located on the Left,Medial Lower Leg . There was a Excisional Skin/Subcutaneous Tissue Debridement with a total area of 10.24 sq cm performed by [name] Dillon, MD. With the following instrument(s): Curette to remove Viable and Non-Viable tissue/material. Material removed includes Subcutaneous Tissue and Slough and after achieving pain control using Lidocaine. No specimens were taken. A time out was conducted at 13:10, prior to the start of the procedure. A Moderate amount of bleeding was controlled with Pressure. The procedure was tolerated well. Post Debridement Measurements: 3.2cm length x 3.2cm width x 0.2cm depth; 1.608cm^3 volume. Character of Wound/Ulcer Post Debridement requires further debridement. Post procedure Diagnosis Wound #5: Same as Pre-Procedure UYEN, EICHHOLZ. ([id number]) Wound #6 Pre-procedure diagnosis of Wound #6 is a Venous Leg Ulcer located on the Left,Lateral Lower Leg .Severity of Tissue Pre Debridement is: Fat layer exposed. There was a Excisional Skin/Subcutaneous Tissue Debridement with a total area of 27.5 sq cm performed by [name] Dillon, MD. With the following instrument(s): Curette to remove Viable and Non-Viable tissue/material. Material removed includes Subcutaneous Tissue and Slough and after achieving pain control using Lidocaine. No specimens were taken. A time out was conducted at 13:10, prior to the start of the procedure. A Moderate amount of bleeding was controlled with Pressure. The procedure was tolerated well. Post Debridement Measurements: 5cm length x 5.5cm width x 0.3cm depth; 6.48cm^3 volume. Character of  Wound/Ulcer Post Debridement is stable. Severity of Tissue Post Debridement is: Limited to breakdown of skin. Post procedure Diagnosis Wound #6: Same as Pre-Procedure Plan Wound Cleansing: Wound #5 Left,Medial Lower Leg: Clean wound with Normal Saline. May Shower, gently pat wound dry prior to applying new dressing. Wound #6 Left,Lateral Lower Leg: Clean wound with Normal Saline. May Shower, gently pat wound dry prior to applying new  dressing. Anesthetic (add to Medication List): Wound #5 Left,Medial Lower Leg: Topical Lidocaine 4% cream applied to wound bed prior to debridement (In Clinic Only). Benzocaine Topical Anesthetic Spray applied to wound bed prior to debridement (In Clinic Only). Wound #6 Left,Lateral Lower Leg: Topical Lidocaine 4% cream applied to wound bed prior to debridement (In Clinic Only). Benzocaine Topical Anesthetic Spray applied to wound bed prior to debridement (In Clinic Only). Skin Barriers/Peri-Wound Care: Wound #5 Left,Medial Lower Leg: Other: - Antibiotic ointment on irritated areas Wound #6 Left,Lateral Lower Leg: Other: - Antibiotic ointment on irritated areas Primary Wound Dressing: Wound #5 Left,Medial Lower Leg: Other: - Sorbact Wound #6 Left,Lateral Lower Leg: Other: - Sorbact Secondary Dressing: Wound #5 Left,Medial Lower Leg: XtraSorb Wound #6 Left,Lateral Lower Leg: XtraSorb Dressing Change Frequency: Wound #5 Left,Medial Lower Leg: Change dressing every week Wound #6 Left,Lateral Lower Leg: Change dressing every week Follow-up Appointments: Wound #5 Left,Medial Lower Leg: Return Appointment in 1 week. Nurse Visit as needed Wound #6 Left,Lateral Lower Leg: Return Appointment in 1 week. Nurse Visit as needed NEENA, BEECHAM ([id number]) Edema Control: Wound #5 Left,Medial Lower Leg: 3 Layer Compression System - Left Lower Extremity Wound #6 Left,Lateral Lower Leg: 3 Layer Compression System - Left Lower  Extremity Off-Loading: Wound #5 Left,Medial Lower Leg: Other: - Elevate legs as needed Wound #6 Left,Lateral Lower Leg: Other: - Elevate legs as needed 1. I am continuing with Sorbact which really seems to have helped with the wound surfaces. 2. I would like to move onto Apligraf after the vein and vascular review July 31 Electronic Signature(s) Signed: 05/27/2019 4:35:36 PM By: Linton Ham MD Entered By: Linton Ham on 05/27/2019 13:49:43 [name], [name] ([id number]) -------------------------------------------------------------------------------- SuperBill Details Patient Name: ESTEFANIE, CORNFORTH. Date of Service: 05/27/2019 Medical Record Number: [id number] Patient Account Number: [id number] Date of Birth/Sex: 1946/04/16 (73 y.o. F) Treating RN: Cornell Barman Primary Care Provider: [name] Other Clinician: Referring Provider: [name] Treating Provider/Extender: Tito Dine in Treatment: 24 Diagnosis Coding ICD-10 Codes Code Description [phone] Non-pressure chronic ulcer of left calf limited to breakdown of skin I87.321 Chronic venous hypertension (idiopathic) with inflammation of right lower extremity I89.0 Lymphedema, not elsewhere classified Facility Procedures CPT4 Code: [id number] Description: 49753 - DEB SUBQ TISSUE 20 SQ CM/< ICD-10 Diagnosis Description L97.221 Non-pressure chronic ulcer of left calf limited to breakdown Modifier: of skin Quantity: 1 CPT4 Code: [id number] Description: 11735 - DEB SUBQ TISS EA ADDL 20CM ICD-10 Diagnosis Description L97.221 Non-pressure chronic ulcer of left calf limited to breakdown Modifier: of skin Quantity: 1 Physician Procedures CPT4 Code: [id number] Description: 30131 - WC PHYS SUBQ TISS 20 SQ CM ICD-10 Diagnosis Description L97.221 Non-pressure chronic ulcer of left calf limited to breakdown Modifier: of skin Quantity: 1 CPT4 Code: [id number] Description: 11045 - WC PHYS SUBQ TISS EA ADDL 20 CM ICD-10  Diagnosis Description L97.221 Non-pressure chronic ulcer of left calf limited to breakdown Modifier: of skin Quantity: 1 Electronic Signature(s) Signed: 05/27/2019 4:35:36 PM By: Linton Ham MD Entered By: Linton Ham on 05/27/2019 13:50:02

## 2019-05-28 NOTE — Progress Notes (Signed)
[name] ([id number]) Visit Report for 05/27/2019 Arrival Information Details Patient Name: [name], [name]. Date of Service: 05/27/2019 12:45 PM Medical Record Number: [id number] Patient Account Number: [id number] Date of Birth/Sex: 1946/04/07 (73 y.o. F) Treating RN: Harold Barban Primary Care Sampson Self: [name] Other Clinician: Referring Kyler Lerette: [name] Treating Donnetta Gillin/Extender: Tito Dine in Treatment: 24 Visit Information History Since Last Visit Added or deleted any medications: No Patient Arrived: Ambulatory Any new allergies or adverse reactions: No Arrival Time: 12:49 Had a fall or experienced change in No Accompanied By: self activities of daily living that may affect Transfer Assistance: None risk of falls: Patient Identification Verified: Yes Signs or symptoms of abuse/neglect since last visito No Secondary Verification Process Completed: Yes Hospitalized since last visit: No Has Dressing in Place as Prescribed: Yes Has Compression in Place as Prescribed: Yes Pain Present Now: No Electronic Signature(s) Signed: 05/28/2019 3:44:22 PM By: Harold Barban Entered By: Harold Barban on 05/27/2019 12:49:23 Gerlich, [name] ([id number]) -------------------------------------------------------------------------------- Encounter Discharge Information Details Patient Name: [name]. Date of Service: 05/27/2019 12:45 PM Medical Record Number: [id number] Patient Account Number: [id number] Date of Birth/Sex: 07-15-1946 (73 y.o. F) Treating RN: Cornell Barman Primary Care Tasha Diaz: [name] Other Clinician: Referring Sania Noy: [name] Treating Dusty Raczkowski/Extender: Tito Dine in Treatment: 24 Encounter Discharge Information Items Post Procedure Vitals Discharge Condition: Stable Temperature (F): 98.3 Ambulatory Status: Ambulatory Pulse (bpm): 88 Discharge Destination: Home Respiratory Rate  (breaths/min): 18 Transportation: Private Auto Blood Pressure (mmHg): 140/73 Accompanied By: self Schedule Follow-up Appointment: Yes Clinical Summary of Care: Electronic Signature(s) Signed: 05/27/2019 4:45:53 PM By: [name], [name], RN, CWS, [name], [name] Entered By: [name], [name], RN, CWS, Kim on 05/27/2019 13:15:06 [name] ([id number]) -------------------------------------------------------------------------------- Lower Extremity Assessment Details Patient Name: [name]. Date of Service: 05/27/2019 12:45 PM Medical Record Number: [id number] Patient Account Number: [id number] Date of Birth/Sex: 1946/05/19 (73 y.o. F) Treating RN: Harold Barban Primary Care Ardell Aaronson: [name] Other Clinician: Referring Jai Bear: [name] Treating Johnmark [name]/Extender: Tito Dine in Treatment: 24 Edema Assessment Assessed: [Left: No] [Right: No] [Left: Edema] [Right: :] Calf Left: Right: Point of Measurement: 31 cm From Medial Instep 41 cm cm Ankle Left: Right: Point of Measurement: 12 cm From Medial Instep 22 cm cm Vascular Assessment Pulses: Dorsalis Pedis Palpable: [Left:Yes] Posterior Tibial Palpable: [Left:Yes] Electronic Signature(s) Signed: 05/28/2019 3:44:22 PM By: Harold Barban Entered By: Harold Barban on 05/27/2019 13:03:03 Sprung, [name] ([id number]) -------------------------------------------------------------------------------- Multi Wound Chart Details Patient Name: [name]. Date of Service: 05/27/2019 12:45 PM Medical Record Number: [id number] Patient Account Number: [id number] Date of Birth/Sex: August 17, 1946 (73 y.o. F) Treating RN: Cornell Barman Primary Care Brendia Dampier: [name] Other Clinician: Referring Gilliam Hawkes: [name] Treating Robie Mcniel/Extender: Tito Dine in Treatment: 24 Vital Signs Height(in): 63 Pulse(bpm): 16 Weight(lbs): 224.7 Blood Pressure(mmHg): 140/73 Body Mass Index(BMI):  40 Temperature(F): 98.3 Respiratory Rate 18 (breaths/min): Photos: [N/A:N/A] Wound Location: Left Lower Leg - Medial Left Lower Leg - Lateral N/A Wounding Event: Gradually Appeared Gradually Appeared N/A Primary Etiology: Lymphedema Venous Leg Ulcer N/A Comorbid History: Cataracts, Asthma, Sleep Cataracts, Asthma, Sleep N/A Apnea, Deep Vein Apnea, Deep Vein Thrombosis, Hypertension, Thrombosis, Hypertension, Peripheral Venous Disease, Peripheral Venous Disease, Osteoarthritis, Received Osteoarthritis, Received Chemotherapy, Received Chemotherapy, Received Radiation Radiation Date Acquired: 11/19/2018 01/19/2019 N/A Weeks of Treatment: 24 18 N/A Wound Status: Open Open N/A Measurements L x W x D 3.2x3.2x0.2 5x5.5x0.2 N/A (cm) Area (cm) : 8.042 21.598 N/A Volume (cm) :  1.608 4.32 N/A % Reduction in Area: 5.90% -9717.30% N/A % Reduction in Volume: -88.10% -19536.40% N/A Classification: Full Thickness Without Full Thickness Without N/A Exposed Support Structures Exposed Support Structures Exudate Amount: Medium Medium N/A Exudate Type: Purulent Purulent N/A Exudate Color: yellow, brown, green yellow, brown, green N/A Wound Margin: Indistinct, nonvisible Indistinct, nonvisible N/A Granulation Amount: Large (67-100%) Medium (34-66%) N/A Granulation Quality: Pink Pink N/A Necrotic Amount: Small (1-33%) Medium (34-66%) N/A Exposed Structures: N/A [name], [name] ([id number]) Fat Layer (Subcutaneous Fat Layer (Subcutaneous Tissue) Exposed: Yes Tissue) Exposed: Yes Fascia: No Fascia: No Tendon: No Tendon: No Muscle: No Muscle: No Joint: No Joint: No Bone: No Bone: No Epithelialization: None None N/A Debridement: Debridement - Excisional Debridement - Excisional N/A Pre-procedure 13:10 13:10 N/A Verification/Time Out Taken: Pain Control: Lidocaine Lidocaine N/A Tissue Debrided: Subcutaneous, Slough Subcutaneous, Slough N/A Level: Skin/Subcutaneous Tissue  Skin/Subcutaneous Tissue N/A Debridement Area (sq cm): 10.24 27.5 N/A Instrument: Curette Curette N/A Bleeding: Moderate Moderate N/A Hemostasis Achieved: Pressure Pressure N/A Debridement Treatment Procedure was tolerated well Procedure was tolerated well N/A Response: Post Debridement 3.2x3.2x0.2 5x5.5x0.3 N/A Measurements L x W x D (cm) Post Debridement Volume: 1.608 6.48 N/A (cm) Procedures Performed: Debridement Debridement N/A Treatment Notes Wound #5 (Left, Medial Lower Leg) Notes sorbact, xtrasorb, 3 layer Wound #6 (Left, Lateral Lower Leg) Notes sorbact, xtrasorb, 3 layer Electronic Signature(s) Signed: 05/27/2019 4:35:36 PM By: Linton Ham MD Entered By: Linton Ham on 05/27/2019 13:42:35 Derrington, [name] ([id number]) -------------------------------------------------------------------------------- Multi-Disciplinary Care Plan Details Patient Name: REGANNE, MESSERSCHMIDT. Date of Service: 05/27/2019 12:45 PM Medical Record Number: [id number] Patient Account Number: [id number] Date of Birth/Sex: Dec 09, 1945 (73 y.o. F) Treating RN: Cornell Barman Primary Care Shawntell Dixson: [name] Other Clinician: Referring Sammi Stolarz: [name] Treating Ellyce Lafevers/Extender: Tito Dine in Treatment: 24 Active Inactive Orientation to the Wound Care Program Nursing Diagnoses: Knowledge deficit related to the wound healing center program Goals: Patient/caregiver will verbalize understanding of the [place] Program Date Initiated: 12/10/2018 Target Resolution Date: 01/09/2019 Goal Status: Active Interventions: Provide education on orientation to the wound center Notes: Soft Tissue Infection Nursing Diagnoses: Impaired tissue integrity Goals: Patient's soft tissue infection will resolve Date Initiated: 12/10/2018 Target Resolution Date: 01/09/2019 Goal Status: Active Interventions: Assess signs and symptoms of infection every visit Notes: Venous Leg  Ulcer Nursing Diagnoses: Actual venous Insuffiency (use after diagnosis is confirmed) Goals: Patient will maintain optimal edema control Date Initiated: 12/10/2018 Target Resolution Date: 01/09/2019 Goal Status: Active Interventions: Assess peripheral edema status every visit. KORTNEY, POTVIN ([id number]) Treatment Activities: Therapeutic compression applied : 12/10/2018 Notes: Wound/Skin Impairment Nursing Diagnoses: Impaired tissue integrity Goals: Patient/caregiver will verbalize understanding of skin care regimen Date Initiated: 12/10/2018 Target Resolution Date: 01/09/2019 Goal Status: Active Interventions: Assess ulceration(s) every visit Treatment Activities: Topical wound management initiated : 12/10/2018 Notes: Electronic Signature(s) Signed: 05/27/2019 4:45:53 PM By: [name], [name], RN, CWS, [name], [name] Entered By: [name], [name], RN, CWS, Kim on 05/27/2019 13:08:07 SHANTICE, MENGER ([id number]) -------------------------------------------------------------------------------- Pain Assessment Details Patient Name: WILBERT, [name]. Date of Service: 05/27/2019 12:45 PM Medical Record Number: [id number] Patient Account Number: [id number] Date of Birth/Sex: 1946/04/07 (73 y.o. F) Treating RN: Harold Barban Primary Care Makila Colombe: [name] Other Clinician: Referring Desarea Ohagan: [name] Treating Adante Courington/Extender: Tito Dine in Treatment: 24 Active Problems Location of Pain Severity and Description of Pain Patient Has Paino No Site Locations Pain Management and Medication Current Pain Management: Electronic Signature(s) Signed: 05/28/2019 3:44:22 PM By: Harold Barban Entered By:  Harold Barban on 05/27/2019 12:51:28 THOMASINA, HOUSLEY ([id number]) -------------------------------------------------------------------------------- Patient/Caregiver Education Details Patient Name: EZMA, REHM. Date of Service: 05/27/2019 12:45 PM Medical Record Number:  [id number] Patient Account Number: [id number] Date of Birth/Gender: [birth year] (73 y.o. F) Treating RN: Cornell Barman Primary Care Physician: [name] Other Clinician: Referring Physician: [name] Treating Physician/Extender: Tito Dine in Treatment: 24 Education Assessment Education Provided To: Patient Education Topics Provided Wound/Skin Impairment: Handouts: Caring for Your Ulcer Methods: Demonstration, Explain/Verbal Responses: State content correctly Electronic Signature(s) Signed: 05/27/2019 4:45:53 PM By: [name], [name], RN, CWS, [name], [name] Entered By: [name], [name], RN, CWS, Kim on 05/27/2019 13:13:32 [name] ([id number]) -------------------------------------------------------------------------------- Wound Assessment Details Patient Name: SUMMAR, MCGLOTHLIN. Date of Service: 05/27/2019 12:45 PM Medical Record Number: [id number] Patient Account Number: [id number] Date of Birth/Sex: 09-03-1946 (73 y.o. F) Treating RN: Harold Barban Primary Care Ostin Mathey: [name] Other Clinician: Referring Robin Pafford: [name] Treating Christabel Camire/Extender: Tito Dine in Treatment: 24 Wound Status Wound Number: 5 Primary Lymphedema Etiology: Wound Location: Left Lower Leg - Medial Wound Open Wounding Event: Gradually Appeared Status: Date Acquired: 11/19/2018 Comorbid Cataracts, Asthma, Sleep Apnea, Deep Vein Weeks Of Treatment: 24 History: Thrombosis, Hypertension, Peripheral Venous Clustered Wound: No Disease, Osteoarthritis, Received Chemotherapy, Received Radiation Photos Wound Measurements Length: (cm) 3.2 Width: (cm) 3.2 Depth: (cm) 0.2 Area: (cm) 8.042 Volume: (cm) 1.608 % Reduction in Area: 5.9% % Reduction in Volume: -88.1% Epithelialization: None Tunneling: No Undermining: No Wound Description Full Thickness Without Exposed Support Classification: Structures Wound Margin: Indistinct,  nonvisible Exudate Medium Amount: Exudate Type: Purulent Exudate Color: yellow, brown, green Foul Odor After Cleansing: No Slough/Fibrino Yes Wound Bed Granulation Amount: Large (67-100%) Exposed Structure Granulation Quality: Pink Fascia Exposed: No Necrotic Amount: Small (1-33%) Fat Layer (Subcutaneous Tissue) Exposed: Yes Necrotic Quality: Adherent Slough Tendon Exposed: No Muscle Exposed: No Joint Exposed: No Bone Exposed: No Whittingham, Avianna J. ([id number]) Treatment Notes Wound #5 (Left, Medial Lower Leg) Notes sorbact, xtrasorb, 3 layer Electronic Signature(s) Signed: 05/28/2019 3:44:22 PM By: Harold Barban Entered By: Harold Barban on 05/27/2019 13:03:43 Foti, [name] ([id number]) -------------------------------------------------------------------------------- Wound Assessment Details Patient Name: ANGALA, HILGERS. Date of Service: 05/27/2019 12:45 PM Medical Record Number: [id number] Patient Account Number: [id number] Date of Birth/Sex: December 31, 1945 (73 y.o. F) Treating RN: Harold Barban Primary Care Alayja Armas: [name] Other Clinician: Referring Estephania Licciardi: [name] Treating Kennidee Heyne/Extender: Tito Dine in Treatment: 24 Wound Status Wound Number: 6 Primary Venous Leg Ulcer Etiology: Wound Location: Left Lower Leg - Lateral Wound Open Wounding Event: Gradually Appeared Status: Date Acquired: 01/19/2019 Comorbid Cataracts, Asthma, Sleep Apnea, Deep Vein Weeks Of Treatment: 18 History: Thrombosis, Hypertension, Peripheral Venous Clustered Wound: No Disease, Osteoarthritis, Received Chemotherapy, Received Radiation Photos Wound Measurements Length: (cm) 5 Width: (cm) 5.5 Depth: (cm) 0.2 Area: (cm) 21.598 Volume: (cm) 4.32 % Reduction in Area: -9717.3% % Reduction in Volume: -19536.4% Epithelialization: None Tunneling: No Undermining: No Wound Description Full Thickness Without Exposed  Support Classification: Structures Wound Margin: Indistinct, nonvisible Exudate Medium Amount: Exudate Type: Purulent Exudate Color: yellow, brown, green Foul Odor After Cleansing: No Slough/Fibrino Yes Wound Bed Granulation Amount: Medium (34-66%) Exposed Structure Granulation Quality: Pink Fascia Exposed: No Necrotic Amount: Medium (34-66%) Fat Layer (Subcutaneous Tissue) Exposed: Yes Necrotic Quality: Adherent Slough Tendon Exposed: No Muscle Exposed: No Joint Exposed: No Bone Exposed: No Thibodaux, Syleena J. ([id number]) Treatment Notes Wound #6 (Left, Lateral Lower Leg) Notes sorbact, xtrasorb, 3 layer Electronic Signature(s) Signed: 05/28/2019 3:44:22 PM By: Harold Barban  Entered By: Harold Barban on 05/27/2019 13:04:12 Gasparyan, [name] ([id number]) -------------------------------------------------------------------------------- Vitals Details Patient Name: LEXII, WALSH. Date of Service: 05/27/2019 12:45 PM Medical Record Number: [id number] Patient Account Number: [id number] Date of Birth/Sex: Oct 07, 1946 (73 y.o. F) Treating RN: Harold Barban Primary Care Elma Shands: [name] Other Clinician: Referring Mayce Noyes: [name] Treating Davis [name]/Extender: Tito Dine in Treatment: 24 Vital Signs Time Taken: 12:45 Temperature (F): 98.3 Height (in): 63 Pulse (bpm): 88 Weight (lbs): 224.7 Respiratory Rate (breaths/min): 18 Body Mass Index (BMI): 39.8 Blood Pressure (mmHg): 140/73 Reference Range: 80 - 120 mg / dl Electronic Signature(s) Signed: 05/28/2019 3:44:22 PM By: Harold Barban Entered By: Harold Barban on 05/27/2019 12:51:50

## 2019-05-29 ENCOUNTER — Other Ambulatory Visit: Payer: Self-pay

## 2019-05-29 DIAGNOSIS — I872 Venous insufficiency (chronic) (peripheral): Secondary | ICD-10-CM

## 2019-06-01 DIAGNOSIS — D2262 Melanocytic nevi of left upper limb, including shoulder: Secondary | ICD-10-CM | POA: Diagnosis not present

## 2019-06-01 DIAGNOSIS — Z85820 Personal history of malignant melanoma of skin: Secondary | ICD-10-CM | POA: Diagnosis not present

## 2019-06-01 DIAGNOSIS — D485 Neoplasm of uncertain behavior of skin: Secondary | ICD-10-CM | POA: Diagnosis not present

## 2019-06-01 DIAGNOSIS — D2261 Melanocytic nevi of right upper limb, including shoulder: Secondary | ICD-10-CM | POA: Diagnosis not present

## 2019-06-01 DIAGNOSIS — L218 Other seborrheic dermatitis: Secondary | ICD-10-CM | POA: Diagnosis not present

## 2019-06-01 DIAGNOSIS — Z85828 Personal history of other malignant neoplasm of skin: Secondary | ICD-10-CM | POA: Diagnosis not present

## 2019-06-03 ENCOUNTER — Encounter: Payer: Medicare Other | Admitting: Internal Medicine

## 2019-06-03 ENCOUNTER — Other Ambulatory Visit: Payer: Self-pay

## 2019-06-03 DIAGNOSIS — I87332 Chronic venous hypertension (idiopathic) with ulcer and inflammation of left lower extremity: Secondary | ICD-10-CM | POA: Diagnosis not present

## 2019-06-03 DIAGNOSIS — L97221 Non-pressure chronic ulcer of left calf limited to breakdown of skin: Secondary | ICD-10-CM | POA: Diagnosis not present

## 2019-06-03 DIAGNOSIS — I87312 Chronic venous hypertension (idiopathic) with ulcer of left lower extremity: Secondary | ICD-10-CM | POA: Diagnosis not present

## 2019-06-03 DIAGNOSIS — L97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d: Secondary | ICD-10-CM | POA: Diagnosis not present

## 2019-06-03 DIAGNOSIS — I890 Lymphedema, not elsewhere classified: Secondary | ICD-10-CM | POA: Diagnosis not present

## 2019-06-03 DIAGNOSIS — L97222 Non-pressure chronic ulcer of left calf with fat layer exposed: Secondary | ICD-10-CM | POA: Diagnosis not present

## 2019-06-04 ENCOUNTER — Telehealth (HOSPITAL_COMMUNITY): Payer: Self-pay | Admitting: *Deleted

## 2019-06-04 NOTE — Telephone Encounter (Signed)
The above patient or their representative was contacted and gave the following answers to these questions:         Do you have any of the following symptoms?n  Fever                    Cough                   Shortness of breath  Do  you have any of the following other symptoms? n   muscle pain         vomiting,        diarrhea        rash         weakness        red eye        abdominal pain         bruising          bruising or bleeding              joint pain           severe headache    Have you been in contact with someone who was or has been sick in the past 2 weeks?n  Yes                 Unsure                         Unable to assess   Does the person that you were in contact with have any of the following symptoms?   Cough         shortness of breath           muscle pain         vomiting,            diarrhea            rash            weakness           fever            red eye           abdominal pain           bruising  or  bleeding                joint pain                severe headache               Have you  or someone you have been in contact with traveled internationally in th last month?         If yes, which countries?   Have you  or someone you have been in contact with traveled outside [place] in th last month?         If yes, which state and city?   COMMENTS OR ACTION PLAN FOR THIS PATIENT:         

## 2019-06-04 NOTE — Progress Notes (Addendum)
NEVADA, KIRCHNER ([id number]) Visit Report for 06/03/2019 HPI Details Patient Name: [name], [name]. Date of Service: 06/03/2019 12:45 PM Medical Record Number: [id number] Patient Account Number: [id number] Date of Birth/Sex: 12-17-1945 (73 y.o. F) Treating RN: Harold Barban Primary Care Provider: [name] Other Clinician: Referring Provider: [name] Treating Provider/Extender: Tito Dine in Treatment: 25 History of Present Illness HPI Description: Pleasant 73 year old with history of chronic venous insufficiency. No diabetes or peripheral vascular disease. Left ABI 1.29. Questionable history of left lower extremity DVT. She developed a recurrent ulceration on her left lateral calf in December 2015, which she attributes to poor diet and subsequent lower extremity edema. She underwent endovenous laser ablation of her left greater saphenous vein in 2010. She underwent laser ablation of accessory branch of left GSV in April 2016 by Dr. Kellie Simmering at [facility]. She was previously wearing Unna boots, which she tolerated well. Tolerating 2 layer compression and cadexomer iodine. She returns to clinic for follow-up and is without new complaints. She denies any significant pain at this time. She reports persistent pain with pressure. No claudication or ischemic rest pain. No fever or chills. No drainage. READMISSION 11/13/16; this is a 73 year old woman who is not a diabetic. She is here for a review of a painful area on her left medial lower extremity. I note that she was seen here previously last year for wound I believe to be in the same area. At that time she had undergone previously a left greater saphenous vein ablation by Dr. Kellie Simmering and she had a ablation of the anterior accessory branch of the left greater saphenous vein in March 2016. Seeing that the wound actually closed over. In reviewing the history with her today the ulcer in this area has been recurrent. She  describes a biopsy of this area in 2009 that only showed stasis physiology. 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 Do not see that she has ever had arterial studies in the left leg. She tells me she has a follow-up with Dr. Kellie Simmering in roughly 10 days In any case she developed the reopening of this area roughly a month ago. On the background of this she describes rapidly increasing edema which has responded to Lasix 40 mg and metolazone 2.5 mg as well as the patient's lymph massage. She has been told she has both venous insufficiency and lymphedema but she cannot tolerate compression stockings 11/28/16; the patient saw Dr. Kellie Simmering recently. Per the patient he did arterial Dopplers in the office that did not show evidence of arterial insufficiency, per the patient he stated "treat this like an ordinary venous ulcer". She also saw her dermatologist Dr. Ronnald Ramp who felt that this was more of a vascular ulcer. In general things are improving although she arrives today with increasing bilateral lower extremity edema with weeping a deeper fluid through the wound on the left medial leg compatible with some degree of lymphedema 12/04/16; the patient's wound is fully epithelialized but I don't think fully healed. We will do another week of depression with Promogran and TCA however I suspect we'll be able to discharge her next week. This is a very unusual-looking wound which was initially a figure-of-eight type wound lying on its side surrounded by petechial like hemorrhage. She  has had venous ablation on this side. She apparently does not have an arterial issue per Dr. Kellie Simmering. She saw her dermatologist thought it was "vascular". Patient is definitely going to need ongoing compression and I  talked about this with her today she will go to elastic therapy after she leaves here next week 12/11/16; the patient's wound is not completely closed today. She has surrounding scar tissue and in further discussion with the patient it would appear that she had ulcers in this area in 2009 for a prolonged period of time ultimately requiring a punch biopsy of this area that only showed venous insufficiency. I did not previously pickup on this part of the history from the patient. 12/18/16; the patient's wound is completely epithelialized. There is no open area here. She has significant bilateral venous insufficiency with secondary lymphedema to a mild-to-moderate degree she does not have compression stockings.. She did not say anything to me when I was in the room, she told our intake nurse that she was still having pain in this area. This isn't [name], [name] ([id number]) unusual recurrent small open area. She is going to go to elastic therapy to obtain compression stockings. 12/25/16; the patient's wound is fully epithelialized. There is no open area here. The patient describes some continued episodic discomfort in this area medial left calf. However everything looks fine and healed here. She is been to elastic therapy and caught herself 15-20 mmHg stockings, they apparently were having trouble getting 20-30 mm stockings in her size 01/22/17; this is a patient we discharged from the clinic a month ago. She has a recurrent open wound on her medial left calf. She had 15 mm support stockings. I told her I thought she needed 20-30 mm compression stockings. She tells me that she has been ill with hospitalization secondary to asthma and is been found to have severe hypokalemia likely secondary to a combination of Lasix and metolazone. This morning she noted blistering and leaking fluid on the posterior part of her left leg. She called our intake nurse urgently and we was saw her this afternoon. She has not  had any real discomfort here. I don't know that she's been wearing any stockings on this leg for at least 2-3 days. ABIs in this clinic were 1.21 on the right and 1.3 on the left. She is previously seen vascular surgery who does not think that there is a peripheral arterial issue. 01/30/17; Patient arrives with no open wound on the left leg. She has been to elastic therapy and obtained 20-26mmhg below knee stockings and she has one on the right leg today. READMISSION 02/19/18; this Billingham is a now 73 year old patient we've had in this clinic perhaps 3 times before. I had last looked at her from January 07 December 2016 with an area on the medial left leg. We discharged her on 12/25/16 however she had to be readmitted on 01/22/17 with a recurrence. I have in my notes that we discharged her on 20-30 mm stockings although she tells me she was only wearing support hose because she cannot get stockings on predominantly related to her cervical spine surgery/issues. She has had previous ablations done by vein and vascular in [place] including a great saphenous vein ablation on the left with an anterior accessory branch ablation I think both of these were in 2016. On one of the previous visit she had a biopsy noted 2009 that was negative. She is not felt to have an arterial issue. She is not  a diabetic. She does have a history of obstructive sleep apnea hypertension asthma as well as chronic venous insufficiency and lymphedema. On this occasion she noted 2 dry scaly patch on her left leg. She tried to put lotion on this it didn't really help. There were 2 open areas.the patient has been seeing her primary physician from 02/05/18 through 02/14/18. She had Unna boots applied. The superior wound now on the lateral left leg has closed but she's had one wound that remains open on the lateral left leg. This is not the same spot as we dealt with in 2018. ABIs in this clinic were 1.3 bilaterally 02/26/18; patient has a  small wound on the left lateral calf. Dimensions are down. She has chronic venous insufficiency and lymphedema. 03/05/18; small open area on the left lateral calf. Dimensions are down. Tightly adherent necrotic debris over the surface of the wound which was difficult to remove. Also the dressing [over collagen] stuck to the wound surface. This was removed with some difficulty as well. Change the primary dressing to Hydrofera Blue ready 03/12/18; small open area on the left lateral calf. Comes in with tightly adherent surface eschar as well as some adherent Hydrofera Blue. 03/19/18; open area on the left lateral calf. Again adherent surface eschar as well as some adherent Hydrofera Blue nonviable subcutaneous tissue. She complained of pain all week even with the reduction from 4-3 layer compression I put on last week. Also she had an increase in her ankle and calf measurements probably related to the same thing. 03/26/18; open area on the left lateral calf. A very small open area remains here. We used silver alginate starting last week as the Hydrofera Blue seem to stick to the wound bed. In using 4-layer compression 04/02/18; the open area in the left lateral calf at some adherent slough which I removed there is no open area here. We are able to transition her into her own compression stocking. Truthfully I think this is probably his support hose. However this does not maintain skin integrity will be limited. She cannot put over the toe compression stockings on because of neck problems hand problems etc. She is allergic to the lining layer of juxta lites. We might be forced to use extremitease stocking should this fail READMIT 11/24/2018 Patient is now a 73 year old woman who is not a diabetic. She has been in this clinic on at least 3 previous occasions largely with recurrent wounds on her left leg secondary to chronic venous insufficiency with secondary lymphedema. Her situation is complicated by  inability to get stockings on and an allergy to neoprene which is apparently a component and at least juxta lites and other stockings. As a result she really has not been wearing any stockings on her legs. She tells [place] that roughly 2 or 3 weeks ago she started noticing a stinging sensation just above her ankle on the left medial aspect. She has been diagnosed with pseudogout and she wondered whether this was what she was experiencing. She tried to dress this with something she bought at the store however subsequently it pulled skin off and now she has an open wound that is not improving. She has been using Vaseline gauze with a cover bandage. She saw her primary doctor last week who [name], [name] ([id number]) put an Unna boot on her. ABIs in this clinic was 1.03 on the left 2/12; the area is on the left medial ankle. Odd-looking wound with what looks to be surface epithelialization  but a multitude of small petechial openings. This clearly not closed yet. We have been using silver alginate under 3 layer compression with TCA 2/19; the wound area did not look quite as good this week. Necrotic debris over the majority of the wound surface which required debridement. She continues to have a multitude of what looked to be small petechial openings. She reminds [place] that she had a biopsy on this initially during her first outbreak in 2015 in [place] dermatology. She expresses concern about this being a possible melanoma. 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 I assured her that this does not look anything like melanoma. Except for the petechial reaction it does look like a venous insufficiency area and she certainly has evidence of this on both sides 2/26; a difficult area on the left medial ankle. The patient clearly has chronic venous hypertension with some degree of lymphedema. The odd thing about the area is  the small petechial hemorrhages. I am not really sure how to explain this. This was present last time and this is not a compression injury. We have been using Hydrofera Blue which I changed to last week 3/4; still using Hydrofera Blue. Aggressive debridement today. She does not have known arterial issues. She has seen Dr. Kellie Simmering at [facility] vascular and and has an ablation on the left. [Anterior accessory branch of the greater saphenous]. From what I remember they did not feel she had an arterial issue. The patient has had this area biopsied in 2009 at [facility] dermatology and by her recollection they said this was "stasis". She is also follow-up with dermatology locally who thought that this was more of a vascular issue 3/11; using Hydrofera Blue. Aggressive debridement today. She does not have an arterial issue. We are using 3 layer compression although we may need to go to 4. The patient has been in for multiple changes to her wrap since I last saw her a week ago. She says that the area was leaking. I do not have too much more information on what was found 01/19/19 on evaluation today patient was actually being seen for a nurse visit when unfortunately she had the area on her left lateral lower extremity as well as weeping from the right lower extremity that became apparent. Therefore we did end up actually seeing her for a full visit with myself. She is having some pain at this site as well but fortunately nothing too significant at this point. No fevers, chills, nausea, or vomiting noted at this time. 3/18-Patient is back to the clinic with the left leg venous leg ulcer, the ulcer is larger in size, has a surface that is densely adherent with fibrinous tissue, the Hydrofera Blue was used but is densely adherent and there was difficulty in removing it. The right lower extremity was also wrapped for weeping edema. Patient has a new area over the left lateral foot above the malleolus  that is small and appears to have no debris with intact surrounding skin. Patient is on increased dose of Lasix also as a means to edema management 3/25; the patient has a nonhealing venous ulcer on the medial left leg and last week developed a smaller area on the lateral left calf. We have been using Hydrofera Blue with a contact layer. 4/1; no major change in these wounds areas. Left medial and more recently left lateral calf. I tried Iodoflex last week  to aid in debridement she did not tolerate this. She stated her pain was terrible all week. She took the top layer of the 4 layer compression off. 4/8; the patient actually looks somewhat better in terms of her more prominent left lateral calf wound. There is some healthy looking tissue here. She is still complaining of a lot of discomfort. 4/15; patient in a lot of pain secondary to sciatica. She is on a prednisone taper prescribed by her primary physician. She has the 2 areas one on the left medial and more recently a smaller area on the left lateral calf. Both of these just above the malleoli 4/22; her back pain is better but she still states she is very uncomfortable and now feels she is intolerant to the [employer]. No real change in the wounds we have been using Sorbact. She has been previously intolerant to Iodoflex. There is not a lot of option about what we can use to debride this wound under compression that she no doubt needs. sHe states Ultram no longer works for her pain 4/29; no major change in the wounds slightly increased depth. Surface on the original medial wound perhaps somewhat improved however the more recent area on the lateral left ankle is 100% covered in very adherent debris we have been using Sorbact. She tolerates 4 layer compression well and her edema control is a lot better. She has not had to come in for a nurse check 5/6; no major change in the condition of the wounds. She did consent to debridement today which was  done with some difficulty. Continuing Sorbact. She did not tolerate Iodoflex. She was in for a check of her compression the day after we wrapped her last week this was adjusted but nothing much was found 5/13; no major change in the condition or area of the wounds. I was able to get a fairly aggressive debridement done on the lateral left leg wound. Even using Sorbact under compression. She came back in on Friday to have the wrap changed. She says she felt uncomfortable on the lateral aspect of her ankle. She has a long history of chronic venous insufficiency including previous ablation surgery on this side. 5/20-Patient returns for wounds on left leg with both wounds covered in slough, with the lateral leg wound larger in size, she has been in 3 layer compression and felt more comfortable, she describes pain in ankle, in leg and pins and needles in foot, [name], [name] ([id number]) and is about to try Pamelor for this 6/3; wounds on the left lateral and left medial leg. The area medially which is the most recent of the 2 seems to have had the largest increase in dimensions. We have been using Sorbac to try and debride the surface. She has been to see orthopedics they apparently did a plain x-ray that was indeterminant. Diagnosed her with neuropathy and they have ordered an MRI to determine if there is underlying osteomyelitis. This was not high on my thought list but I suppose it is prudent. We have advised her to make an appointment with vein and vascular in [place]. She has a history of a left greater saphenous and accessory vein ablations I wonder if there is anything else that can be done from a surgical point of view to help in these difficult refractory wounds. We have previously healed this wound on one occasion but it keeps on reopening [medial side] 6/10; deep tissue culture I did last week I think on the  left medial wound showed both moderate E. coli and moderate staph aureus  [MSSA]. She is going to require antibiotics and I have chosen Augmentin. We have been using Sorbact and we have made better looking wound surface on both sides but certainly no improvement in wound area. She was back in last Friday apparently for a dressing changes the wrap was hurting her outer left ankle. She has not managed to get a hold of vein and vascular in [name] We are going to have to make her that appointment 6/17; patient is tolerating the Augmentin. She had an MRI that I think was ordered by orthopedic surgeon this did not show osteomyelitis or an abscess did suggest cellulitis. We have been using Sorbact to the lateral and medial ankles. We have been trying to arrange a follow-up appointment with vein and vascular in [place] or did her original ablations. We apparently an area sent the request to vein and vascular in [facility] 6/24; patient has completed the Augmentin. We do not yet have a vein and vascular appointment in [place]. I am not sure what the issue is here we have asked her to call tomorrow. We are using Sorbact. Making some improvements and especially the medial wound. Both surfaces however look better medial and lateral. 7/1; the patient has been in contact with vein and vascular in [place] but has not yet received an appointment. Using Sorbact we have gradually improve the wound surface with no improvement in surface area. She is approved for Apligraf but the wound surface still is not completely viable. She has not had to come in for a dressing change 7/8; the patient has an appointment with vein and vascular on 7/31 which is a Friday afternoon. She is concerned about getting back here for [place] to dress her wounds. I think it is important to have them goal for her venous reflux/history of ablations etc. to see if anything else can be done. She apparently tested positive for 1 of the blood tests with regards to lupus and saw a rheumatologist. He has raised  the issue of vasculitis again. I have had this thought in the past however the evidence seems overwhelming that this is a venous reflux etiology. If the rheumatologist tells me there is clinical and laboratory investigation is positive for lupus I will rethink this. 7/15; the patient's wound surfaces are quite a bit better. The medial area which was her original wound now has no depth although the lateral wound which was the more recent area actually appears larger. Both with viable surfaces which is indeed better. Using Sorbact. I wanted to use Apligraf on her however there is the issue of the vein and vascular appointment on 7/31 at 2:00 in the afternoon which would not allow her to get back to be rewrapped and they would no doubt remove the graft 7/22; the patient's wound surfaces have moderate amount of debris although generally look better. The lateral one is larger with 2 small satellite areas superiorly. We are waiting for her vein and vascular appointment on 7/31. She has been approved for Apligraf which I would like to use after that 7/29; wound surfaces have improved no debridement is required we have been using Sorbact. She sees vein and vascular on Friday with this so question of whether anything can be done to lessen the likelihood of recurrence and/or speed the healing of these areas. She is already had previous ablations. She no doubt has severe venous hypertension Electronic Signature(s) Signed: 06/03/2019 5:12:30 PM  By: Linton Ham MD Entered By: Linton Ham on 06/03/2019 13:32:42 [name] ([id number]) -------------------------------------------------------------------------------- Physical Exam Details Patient Name: [name], [name]. Date of Service: 06/03/2019 12:45 PM Medical Record Number: [id number] Patient Account Number: [id number] Date of Birth/Sex: Sep 05, 1946 (73 y.o. F) Treating RN: Harold Barban Primary Care Provider: [name] Other  Clinician: Referring Provider: [name] Treating Provider/Extender: Tito Dine in Treatment: 25 Respiratory Respiratory effort is easy and symmetric bilaterally. Rate is normal at rest and on room air.. Cardiovascular . Pedal pulses are palpable. Edema control is excellent. Integumentary (Hair, Skin) No infection around the wound. Psychiatric No evidence of depression, anxiety, or agitation. Calm, cooperative, and communicative. Appropriate interactions and affect.. Notes Wound exam; the original wound on the right medial leg is now superficial as is the subsequent wound on the right lateral leg. Both of these have reasonably healthy looking tissues. There is no evidence of surrounding infection Electronic Signature(s) Signed: 06/03/2019 5:12:30 PM By: Linton Ham MD Entered By: Linton Ham on 06/03/2019 13:34:26 [name], [name] ([id number]) -------------------------------------------------------------------------------- Physician Orders Details Patient Name: [name], [name]. Date of Service: 06/03/2019 12:45 PM Medical Record Number: [id number] Patient Account Number: [id number] Date of Birth/Sex: 03-29-1946 (73 y.o. F) Treating RN: Harold Barban Primary Care Provider: [name] Other Clinician: Referring Provider: [name] Treating Provider/Extender: Tito Dine in Treatment: 25 Verbal / Phone Orders: No Diagnosis Coding Wound Cleansing Wound #5 Left,Medial Lower Leg o Clean wound with Normal Saline. o May Shower, gently pat wound dry prior to applying new dressing. Wound #6 Left,Lateral Lower Leg o Clean wound with Normal Saline. o May Shower, gently pat wound dry prior to applying new dressing. Anesthetic (add to Medication List) Wound #5 Left,Medial Lower Leg o Topical Lidocaine 4% cream applied to wound bed prior to debridement (In Clinic Only). o Benzocaine Topical Anesthetic Spray applied to wound  bed prior to debridement (In Clinic Only). Wound #6 Left,Lateral Lower Leg o Topical Lidocaine 4% cream applied to wound bed prior to debridement (In Clinic Only). o Benzocaine Topical Anesthetic Spray applied to wound bed prior to debridement (In Clinic Only). Skin Barriers/Peri-Wound Care Wound #5 Left,Medial Lower Leg o Other: - Antibiotic ointment on irritated areas Wound #6 Left,Lateral Lower Leg o Other: - Antibiotic ointment on irritated areas Primary Wound Dressing Wound #5 Left,Medial Lower Leg o Other: - Sorbact Wound #6 Left,Lateral Lower Leg o Other: - Sorbact Secondary Dressing Wound #5 Left,Medial Lower Leg o XtraSorb Wound #6 Left,Lateral Lower Leg o XtraSorb Dressing Change Frequency Wound #5 Left,Medial Lower Leg o Change dressing every week [name], [name] ([id number]) Wound #6 Left,Lateral Lower Leg o Change dressing every week Follow-up Appointments Wound #5 Left,Medial Lower Leg o Return Appointment in 1 week. o Nurse Visit as needed - Return Monday, August 3rd for a re-wrap Wound #6 Left,Lateral Lower Leg o Return Appointment in 1 week. o Nurse Visit as needed Edema Control Wound #5 Left,Medial Lower Leg o 3 Layer Compression System - Left Lower Extremity Wound #6 Left,Lateral Lower Leg o 3 Layer Compression System - Left Lower Extremity Off-Loading Wound #5 Left,Medial Lower Leg o Other: - Elevate legs as needed Wound #6 Left,Lateral Lower Leg o Other: - Elevate legs as needed Electronic Signature(s) Signed: 06/03/2019 4:53:39 PM By: Harold Barban Signed: 06/03/2019 5:12:30 PM By: Linton Ham MD Entered By: Harold Barban on 06/03/2019 13:17:42 Lange, [name] ([id number]) -------------------------------------------------------------------------------- Problem List Details Patient Name: AILANIE, RUTTAN. Date of Service: 06/03/2019 12:45 PM Medical  Record Number: [id number] Patient Account Number:  [id number] Date of Birth/Sex: [birth year] (73 y.o. F) Treating RN: Harold Barban Primary Care Provider: [name] Other Clinician: Referring Provider: [name] Treating Provider/Extender: Tito Dine in Treatment: 25 Active Problems ICD-10 Evaluated Encounter Code Description Active Date Today Diagnosis L97.221 Non-pressure chronic ulcer of left calf limited to breakdown of 01/07/2019 No Yes skin I87.321 Chronic venous hypertension (idiopathic) with inflammation of 12/10/2018 No Yes right lower extremity I89.0 Lymphedema, not elsewhere classified 12/10/2018 No Yes Inactive Problems Resolved Problems Electronic Signature(s) Signed: 06/03/2019 5:12:30 PM By: Linton Ham MD Entered By: Linton Ham on 06/03/2019 13:31:01 [place], [name] ([id number]) -------------------------------------------------------------------------------- Progress Note Details Patient Name: [name], [name]. Date of Service: 06/03/2019 12:45 PM Medical Record Number: [id number] Patient Account Number: [id number] Date of Birth/Sex: [birth year] (73 y.o. F) Treating RN: Harold Barban Primary Care Provider: [name] Other Clinician: Referring Provider: [name] Treating Provider/Extender: Tito Dine in Treatment: 25 Subjective History of Present Illness (HPI) Pleasant 73 year old with history of chronic venous insufficiency. No diabetes or peripheral vascular disease. Left ABI 1.29. Questionable history of left lower extremity DVT. She developed a recurrent ulceration on her left lateral calf in December 2015, which she attributes to poor diet and subsequent lower extremity edema. She underwent endovenous laser ablation of her left greater saphenous vein in 2010. She underwent laser ablation of accessory branch of left GSV in April 2016 by Dr. Kellie Simmering at [facility]. She was previously wearing Unna boots, which she tolerated well. Tolerating 2 layer  compression and cadexomer iodine. She returns to clinic for follow-up and is without new complaints. She denies any significant pain at this time. She reports persistent pain with pressure. No claudication or ischemic rest pain. No fever or chills. No drainage. READMISSION 11/13/16; this is a 73 year old woman who is not a diabetic. She is here for a review of a painful area on her left medial lower extremity. I note that she was seen here previously last year for wound I believe to be in the same area. At that time she had undergone previously a left greater saphenous vein ablation by Dr. Kellie Simmering and she had a ablation of the anterior accessory branch of the left greater saphenous vein in March 2016. Seeing that the wound actually closed over. In reviewing the history with her today the ulcer in this area has been recurrent. She describes a biopsy of this area in 2009 that only showed stasis physiology. 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 Do not see that she has ever had arterial studies in the left leg. She tells me she has a follow-up with Dr. Kellie Simmering in roughly 10 days In any case she developed the reopening of this area roughly a month ago. On the background of this she describes rapidly increasing edema which has responded to Lasix 40 mg and metolazone 2.5 mg as well as the patient's lymph massage. She has been told she has both venous insufficiency and lymphedema but she cannot tolerate compression stockings 11/28/16; the patient saw Dr. Kellie Simmering recently. Per the patient he did arterial Dopplers in the office that did not show evidence of arterial insufficiency, per the patient he stated "treat this like an ordinary venous ulcer". She also saw  her dermatologist Dr. Ronnald Ramp  who felt that this was more of a vascular ulcer. In general things are improving although she arrives today with increasing bilateral lower extremity edema with weeping a deeper fluid through the wound on the left medial leg compatible with some degree of lymphedema 12/04/16; the patient's wound is fully epithelialized but I don't think fully healed. We will do another week of depression with Promogran and TCA however I suspect we'll be able to discharge her next week. This is a very unusual-looking wound which was initially a figure-of-eight type wound lying on its side surrounded by petechial like hemorrhage. She has had venous ablation on this side. She apparently does not have an arterial issue per Dr. Kellie Simmering. She saw her dermatologist thought it was "vascular". Patient is definitely going to need ongoing compression and I talked about this with her today she will go to elastic therapy after she leaves here next week 12/11/16; the patient's wound is not completely closed today. She has surrounding scar tissue and in further discussion with the patient it would appear that she had ulcers in this area in 2009 for a prolonged period of time ultimately requiring a punch biopsy of this area that only showed venous insufficiency. I did not previously pickup on this part of the history from the patient. 12/18/16; the patient's wound is completely epithelialized. There is no open area here. She has significant bilateral venous insufficiency with secondary lymphedema to a mild-to-moderate degree she does not have compression stockings.. She did not say anything to me when I was in the room, she told our intake nurse that she was still having pain in this area. This isn't unusual recurrent small open area. She is going to go to elastic therapy to obtain compression stockings. 12/25/16; the patient's wound is fully epithelialized. There is no open area here. The patient describes some continued  episodic discomfort in this area medial left calf. However everything looks fine and healed here. She is been to elastic therapy and ELWANDA, MOGER ([id number]) caught herself 15-20 mmHg stockings, they apparently were having trouble getting 20-30 mm stockings in her size 01/22/17; this is a patient we discharged from the clinic a month ago. She has a recurrent open wound on her medial left calf. She had 15 mm support stockings. I told her I thought she needed 20-30 mm compression stockings. She tells me that she has been ill with hospitalization secondary to asthma and is been found to have severe hypokalemia likely secondary to a combination of Lasix and metolazone. This morning she noted blistering and leaking fluid on the posterior part of her left leg. She called our intake nurse urgently and we was saw her this afternoon. She has not had any real discomfort here. I don't know that she's been wearing any stockings on this leg for at least 2-3 days. ABIs in this clinic were 1.21 on the right and 1.3 on the left. She is previously seen vascular surgery who does not think that there is a peripheral arterial issue. 01/30/17; Patient arrives with no open wound on the left leg. She has been to elastic therapy and obtained 20-42mmhg below knee stockings and she has one on the right leg today. READMISSION 02/19/18; this [place] is a now 73 year old patient we've had in this clinic perhaps 3 times before. I had last looked at her from January 07 December 2016 with an area on the medial left leg. We discharged her on 12/25/16 however she had  to be readmitted on 01/22/17 with a recurrence. I have in my notes that we discharged her on 20-30 mm stockings although she tells me she was only wearing support hose because she cannot get stockings on predominantly related to her cervical spine surgery/issues. She has had previous ablations done by vein and vascular in [place] including a great saphenous  vein ablation on the left with an anterior accessory branch ablation I think both of these were in 2016. On one of the previous visit she had a biopsy noted 2009 that was negative. She is not felt to have an arterial issue. She is not a diabetic. She does have a history of obstructive sleep apnea hypertension asthma as well as chronic venous insufficiency and lymphedema. On this occasion she noted 2 dry scaly patch on her left leg. She tried to put lotion on this it didn't really help. There were 2 open areas.the patient has been seeing her primary physician from 02/05/18 through 02/14/18. She had Unna boots applied. The superior wound now on the lateral left leg has closed but she's had one wound that remains open on the lateral left leg. This is not the same spot as we dealt with in 2018. ABIs in this clinic were 1.3 bilaterally 02/26/18; patient has a small wound on the left lateral calf. Dimensions are down. She has chronic venous insufficiency and lymphedema. 03/05/18; small open area on the left lateral calf. Dimensions are down. Tightly adherent necrotic debris over the surface of the wound which was difficult to remove. Also the dressing [over collagen] stuck to the wound surface. This was removed with some difficulty as well. Change the primary dressing to Hydrofera Blue ready 03/12/18; small open area on the left lateral calf. Comes in with tightly adherent surface eschar as well as some adherent Hydrofera Blue. 03/19/18; open area on the left lateral calf. Again adherent surface eschar as well as some adherent Hydrofera Blue nonviable subcutaneous tissue. She complained of pain all week even with the reduction from 4-3 layer compression I put on last week. Also she had an increase in her ankle and calf measurements probably related to the same thing. 03/26/18; open area on the left lateral calf. A very small open area remains here. We used silver alginate starting last week as the Hydrofera Blue  seem to stick to the wound bed. In using 4-layer compression 04/02/18; the open area in the left lateral calf at some adherent slough which I removed there is no open area here. We are able to transition her into her own compression stocking. Truthfully I think this is probably his support hose. However this does not maintain skin integrity will be limited. She cannot put over the toe compression stockings on because of neck problems hand problems etc. She is allergic to the lining layer of juxta lites. We might be forced to use extremitease stocking should this fail READMIT 11/24/2018 Patient is now a 73 year old woman who is not a diabetic. She has been in this clinic on at least 3 previous occasions largely with recurrent wounds on her left leg secondary to chronic venous insufficiency with secondary lymphedema. Her situation is complicated by inability to get stockings on and an allergy to neoprene which is apparently a component and at least juxta lites and other stockings. As a result she really has not been wearing any stockings on her legs. She tells [place] that roughly 2 or 3 weeks ago she started noticing a stinging sensation just above her  ankle on the left medial aspect. She has been diagnosed with pseudogout and she wondered whether this was what she was experiencing. She tried to dress this with something she bought at the store however subsequently it pulled skin off and now she has an open wound that is not improving. She has been using Vaseline gauze with a cover bandage. She saw her primary doctor last week who put an Haematologist on her. ABIs in this clinic was 1.03 on the left [name], [name]. ([id number]) 2/12; the area is on the left medial ankle. Odd-looking wound with what looks to be surface epithelialization but a multitude of small petechial openings. This clearly not closed yet. We have been using silver alginate under 3 layer compression with TCA 2/19; the wound area did not  look quite as good this week. Necrotic debris over the majority of the wound surface which required debridement. She continues to have a multitude of what looked to be small petechial openings. She reminds [place] that she had a biopsy on this initially during her first outbreak in 2015 in [place] dermatology. She expresses concern about this being a possible melanoma. 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 I assured her that this does not look anything like melanoma. Except for the petechial reaction it does look like a venous insufficiency area and she certainly has evidence of this on both sides 2/26; a difficult area on the left medial ankle. The patient clearly has chronic venous hypertension with some degree of lymphedema. The odd thing about the area is the small petechial hemorrhages. I am not really sure how to explain this. This was present last time and this is not a compression injury. We have been using Hydrofera Blue which I changed to last week 3/4; still using Hydrofera Blue. Aggressive debridement today. She does not have known arterial issues. She has seen Dr. Kellie Simmering at [facility] vein and vascular and and has an ablation on the left. [Anterior accessory branch of the greater saphenous]. From what I remember they did not feel she had an arterial issue. The patient has had this area biopsied in 2009 at [facility] dermatology and by her recollection they said this was "stasis". She is also follow-up with dermatology locally who thought that this was more of a vascular issue 3/11; using Hydrofera Blue. Aggressive debridement today. She does not have an arterial issue. We are using 3 layer compression although we may need to go to 4. The patient has been in for multiple changes to her wrap since I last saw her a week ago. She says that the area was leaking. I do not have too much more information on  what was found 01/19/19 on evaluation today patient was actually being seen for a nurse visit when unfortunately she had the area on her left lateral lower extremity as well as weeping from the right lower extremity that became apparent. Therefore we did end up actually seeing her for a full visit with myself. She is having some pain at this site as well but fortunately nothing too significant at this point. No fevers, chills, nausea, or vomiting noted at this time. 3/18-Patient is back to the clinic with the left leg venous leg ulcer, the ulcer is larger in size, has a surface that is densely adherent with fibrinous tissue, the Hydrofera Blue was used but is densely adherent and there was difficulty in removing it.  The right lower extremity was also wrapped for weeping edema. Patient has a new area over the left lateral foot above the malleolus that is small and appears to have no debris with intact surrounding skin. Patient is on increased dose of Lasix also as a means to edema management 3/25; the patient has a nonhealing venous ulcer on the medial left leg and last week developed a smaller area on the lateral left calf. We have been using Hydrofera Blue with a contact layer. 4/1; no major change in these wounds areas. Left medial and more recently left lateral calf. I tried Iodoflex last week to aid in debridement she did not tolerate this. She stated her pain was terrible all week. She took the top layer of the 4 layer compression off. 4/8; the patient actually looks somewhat better in terms of her more prominent left lateral calf wound. There is some healthy looking tissue here. She is still complaining of a lot of discomfort. 4/15; patient in a lot of pain secondary to sciatica. She is on a prednisone taper prescribed by her primary physician. She has the 2 areas one on the left medial and more recently a smaller area on the left lateral calf. Both of these just above the malleoli 4/22; her  back pain is better but she still states she is very uncomfortable and now feels she is intolerant to the [employer]. No real change in the wounds we have been using Sorbact. She has been previously intolerant to Iodoflex. There is not a lot of option about what we can use to debride this wound under compression that she no doubt needs. sHe states Ultram no longer works for her pain 4/29; no major change in the wounds slightly increased depth. Surface on the original medial wound perhaps somewhat improved however the more recent area on the lateral left ankle is 100% covered in very adherent debris we have been using Sorbact. She tolerates 4 layer compression well and her edema control is a lot better. She has not had to come in for a nurse check 5/6; no major change in the condition of the wounds. She did consent to debridement today which was done with some difficulty. Continuing Sorbact. She did not tolerate Iodoflex. She was in for a check of her compression the day after we wrapped her last week this was adjusted but nothing much was found 5/13; no major change in the condition or area of the wounds. I was able to get a fairly aggressive debridement done on the lateral left leg wound. Even using Sorbact under compression. She came back in on Friday to have the wrap changed. She says she felt uncomfortable on the lateral aspect of her ankle. She has a long history of chronic venous insufficiency including previous ablation surgery on this side. 5/20-Patient returns for wounds on left leg with both wounds covered in slough, with the lateral leg wound larger in size, she has been in 3 layer compression and felt more comfortable, she describes pain in ankle, in leg and pins and needles in foot, and is about to try Pamelor for this 6/3; wounds on the left lateral and left medial leg. The area medially which is the most recent of the 2 seems to have had the largest increase in dimensions. We have  been using Sorbac to try and debride the surface. She has been to see orthopedics [name], [name] ([id number]) they apparently did a plain x-ray that was indeterminant. Diagnosed her with  neuropathy and they have ordered an MRI to determine if there is underlying osteomyelitis. This was not high on my thought list but I suppose it is prudent. We have advised her to make an appointment with vein and vascular in [place]. She has a history of a left greater saphenous and accessory vein ablations I wonder if there is anything else that can be done from a surgical point of view to help in these difficult refractory wounds. We have previously healed this wound on one occasion but it keeps on reopening [medial side] 6/10; deep tissue culture I did last week I think on the left medial wound showed both moderate E. coli and moderate staph aureus [MSSA]. She is going to require antibiotics and I have chosen Augmentin. We have been using Sorbact and we have made better looking wound surface on both sides but certainly no improvement in wound area. She was back in last Friday apparently for a dressing changes the wrap was hurting her outer left ankle. She has not managed to get a hold of vein and vascular in [place]. We are going to have to make her that appointment 6/17; patient is tolerating the Augmentin. She had an MRI that I think was ordered by orthopedic surgeon this did not show osteomyelitis or an abscess did suggest cellulitis. We have been using Sorbact to the lateral and medial ankles. We have been trying to arrange a follow-up appointment with vein and vascular in [place] or did her original ablations. We apparently an area sent the request to vein and vascular in [facility] 6/24; patient has completed the Augmentin. We do not yet have a vein and vascular appointment in [place]. I am not sure what the issue is here we have asked her to call tomorrow. We are using Sorbact. Making some  improvements and especially the medial wound. Both surfaces however look better medial and lateral. 7/1; the patient has been in contact with vein and vascular in [place] but has not yet received an appointment. Using Sorbact we have gradually improve the wound surface with no improvement in surface area. She is approved for Apligraf but the wound surface still is not completely viable. She has not had to come in for a dressing change 7/8; the patient has an appointment with vein and vascular on 7/31 which is a Friday afternoon. She is concerned about getting back here for [place] to dress her wounds. I think it is important to have them goal for her venous reflux/history of ablations etc. to see if anything else can be done. She apparently tested positive for 1 of the blood tests with regards to lupus and saw a rheumatologist. He has raised the issue of vasculitis again. I have had this thought in the past however the evidence seems overwhelming that this is a venous reflux etiology. If the rheumatologist tells me there is clinical and laboratory investigation is positive for lupus I will rethink this. 7/15; the patient's wound surfaces are quite a bit better. The medial area which was her original wound now has no depth although the lateral wound which was the more recent area actually appears larger. Both with viable surfaces which is indeed better. Using Sorbact. I wanted to use Apligraf on her however there is the issue of the vein and vascular appointment on 7/31 at 2:00 in the afternoon which would not allow her to get back to be rewrapped and they would no doubt remove the graft 7/22; the patient's wound surfaces have moderate  amount of debris although generally look better. The lateral one is larger with 2 small satellite areas superiorly. We are waiting for her vein and vascular appointment on 7/31. She has been approved for Apligraf which I would like to use after that 7/29; wound surfaces  have improved no debridement is required we have been using Sorbact. She sees vein and vascular on Friday with this so question of whether anything can be done to lessen the likelihood of recurrence and/or speed the healing of these areas. She is already had previous ablations. She no doubt has severe venous hypertension Objective Constitutional Vitals Time Taken: 12:47 PM, Height: 63 in, Weight: 224.7 lbs, BMI: 39.8, Temperature: 98.7 F, Pulse: 74 bpm, Respiratory Rate: 18 breaths/min, Blood Pressure: 127/68 mmHg. Respiratory Respiratory effort is easy and symmetric bilaterally. Rate is normal at rest and on room air.. Cardiovascular Pedal pulses are palpable. Edema control is excellent. CRYSTALANN, KORF ([id number]) Psychiatric No evidence of depression, anxiety, or agitation. Calm, cooperative, and communicative. Appropriate interactions and affect.. General Notes: Wound exam; the original wound on the left medial leg is now superficial as is the subsequent wound on the left lateral leg. Both of these have reasonably healthy looking tissues. There is no evidence of surrounding infection Integumentary (Hair, Skin) No infection around the wound. Wound #5 status is Open. Original cause of wound was Gradually Appeared. The wound is located on the Left,Medial Lower Leg. The wound measures 3.6cm length x 2.8cm width x 0.2cm depth; 7.917cm^2 area and 1.583cm^3 volume. There is Fat Layer (Subcutaneous Tissue) Exposed exposed. There is no tunneling or undermining noted. There is a large amount of purulent drainage noted. The wound margin is indistinct and nonvisible. There is large (67-100%) pink granulation within the wound bed. There is a small (1-33%) amount of necrotic tissue within the wound bed including Adherent Slough. Wound #6 status is Open. Original cause of wound was Gradually Appeared. The wound is located on the Left,Lateral Lower Leg. The wound measures 4cm length x 5cm width x  0.2cm depth; 15.708cm^2 area and 3.142cm^3 volume. There is Fat Layer (Subcutaneous Tissue) Exposed exposed. There is no tunneling or undermining noted. There is a large amount of purulent drainage noted. The wound margin is indistinct and nonvisible. There is medium (34-66%) pink granulation within the wound bed. There is a medium (34-66%) amount of necrotic tissue within the wound bed including Adherent Slough. Assessment Active Problems ICD-10 Non-pressure chronic ulcer of left calf limited to breakdown of skin Chronic venous hypertension (idiopathic) with inflammation of right lower extremity Lymphedema, not elsewhere classified Plan Wound Cleansing: Wound #5 Left,Medial Lower Leg: Clean wound with Normal Saline. May Shower, gently pat wound dry prior to applying new dressing. Wound #6 Left,Lateral Lower Leg: Clean wound with Normal Saline. May Shower, gently pat wound dry prior to applying new dressing. Anesthetic (add to Medication List): Wound #5 Left,Medial Lower Leg: Topical Lidocaine 4% cream applied to wound bed prior to debridement (In Clinic Only). Benzocaine Topical Anesthetic Spray applied to wound bed prior to debridement (In Clinic Only). Wound #6 Left,Lateral Lower Leg: Topical Lidocaine 4% cream applied to wound bed prior to debridement (In Clinic Only). Benzocaine Topical Anesthetic Spray applied to wound bed prior to debridement (In Clinic Only). Skin Barriers/Peri-Wound Care: Wound #5 Left,Medial Lower Leg: Other: - Antibiotic ointment on irritated areas CAMARYN, LUMBERT. ([id number]) Wound #6 Left,Lateral Lower Leg: Other: - Antibiotic ointment on irritated areas Primary Wound Dressing: Wound #5 Left,Medial Lower Leg: Other: - Sorbact  Wound #6 Left,Lateral Lower Leg: Other: - Sorbact Secondary Dressing: Wound #5 Left,Medial Lower Leg: XtraSorb Wound #6 Left,Lateral Lower Leg: XtraSorb Dressing Change Frequency: Wound #5 Left,Medial Lower Leg: Change  dressing every week Wound #6 Left,Lateral Lower Leg: Change dressing every week Follow-up Appointments: Wound #5 Left,Medial Lower Leg: Return Appointment in 1 week. Nurse Visit as needed - Return Monday, August 3rd for a re-wrap Wound #6 Left,Lateral Lower Leg: Return Appointment in 1 week. Nurse Visit as needed Edema Control: Wound #5 Left,Medial Lower Leg: 3 Layer Compression System - Left Lower Extremity Wound #6 Left,Lateral Lower Leg: 3 Layer Compression System - Left Lower Extremity Off-Loading: Wound #5 Left,Medial Lower Leg: Other: - Elevate legs as needed Wound #6 Left,Lateral Lower Leg: Other: - Elevate legs as needed 1. Continue Sorbact. 2. She sees vein and vascular on Friday 3. She may need to come back for a nurse change on Monday since no doubt vein and vascular will want to take the dressing off as they should. Perhaps they might put her in an Haematologist. 4. I think the plan would be to see her next week check the wound surfaces see if it debridement is necessary and look towards placing an Apligraf for 2 weeks from today Electronic Signature(s) Signed: 06/05/2019 4:29:25 PM By: [name], [name], RN, CWS, [name], [name] Signed: 06/07/2019 5:44:31 PM By: Linton Ham MD Previous Signature: 06/03/2019 5:12:30 PM Version By: Linton Ham MD Entered By: [name], [name], RN, CWS, Kim on 06/05/2019 16:29:25 NAKEITHA, MILLIGAN ([id number]) -------------------------------------------------------------------------------- SuperBill Details Patient Name: MARLENA, BARBATO. Date of Service: 06/03/2019 Medical Record Number: [id number] Patient Account Number: [id number] Date of Birth/Sex: Aug 27, 1946 (73 y.o. F) Treating RN: Harold Barban Primary Care Provider: [name] Other Clinician: Referring Provider: [name] Treating Provider/Extender: Tito Dine in Treatment: 25 Diagnosis Coding ICD-10 Codes Code Description [id number] Non-pressure chronic ulcer of  left calf limited to breakdown of skin I87.321 Chronic venous hypertension (idiopathic) with inflammation of right lower extremity I89.0 Lymphedema, not elsewhere classified Facility Procedures CPT4 Code: 06301601 Description: 99213 - WOUND CARE VISIT-LEV 3 EST PT Modifier: Quantity: 1 Physician Procedures CPT4 Code Description: 0932355 Bell - WC PHYS LEVEL 3 - EST PT ICD-10 Diagnosis Description L97.221 Non-pressure chronic ulcer of left calf limited to breakdown I87.321 Chronic venous hypertension (idiopathic) with inflammation of I89.0 Lymphedema, not  elsewhere classified Modifier: of skin right lower extr Quantity: 1 emity Electronic Signature(s) Signed: 06/03/2019 5:12:30 PM By: Linton Ham MD Entered By: Linton Ham on 06/03/2019 14:32:30

## 2019-06-04 NOTE — Progress Notes (Signed)
[name] ([id number]) Visit Report for 06/03/2019 Arrival Information Details Patient Name: [name], LOUDERBACK. Date of Service: 06/03/2019 12:45 PM Medical Record Number: [id number] Patient Account Number: [id number] Date of Birth/Sex: [birth year] (73 y.o. F) Treating RN: Harold Barban Primary Care Monet North: [name] Other Clinician: Referring Aryaan Persichetti: [name] Treating Issaih Kaus/Extender: Tito Dine in Treatment: 25 Visit Information History Since Last Visit Added or deleted any medications: No Patient Arrived: Ambulatory Any new allergies or adverse reactions: No Arrival Time: 12:48 Had a fall or experienced change in No Accompanied By: self activities of daily living that may affect Transfer Assistance: None risk of falls: Patient Identification Verified: Yes Signs or symptoms of abuse/neglect since last visito No Secondary Verification Process Completed: Yes Hospitalized since last visit: No Implantable device outside of the clinic excluding No cellular tissue based products placed in the center since last visit: Has Dressing in Place as Prescribed: Yes Has Compression in Place as Prescribed: Yes Pain Present Now: No Electronic Signature(s) Signed: 06/03/2019 4:24:39 PM By: Lorine Bears RCP, RRT, CHT Entered By: Lorine Bears on 06/03/2019 12:49:19 Stille, [name] ([id number]) -------------------------------------------------------------------------------- Clinic Level of Care Assessment Details Patient Name: [name]. Date of Service: 06/03/2019 12:45 PM Medical Record Number: [id number] Patient Account Number: [id number] Date of Birth/Sex: 08/06/1946 (74 y.o. F) Treating RN: Harold Barban Primary Care Danahi Reddish: [name] Other Clinician: Referring Leonardo Makris: [name] Treating Damaso Laday/Extender: Tito Dine in Treatment: 25 Clinic Level of Care Assessment Items TOOL 4  Quantity Score []  - Use when only an EandM is performed on FOLLOW-UP visit 0 ASSESSMENTS - Nursing Assessment / Reassessment X - Reassessment of Co-morbidities (includes updates in patient status) 1 10 X- 1 5 Reassessment of Adherence to Treatment Plan ASSESSMENTS - Wound and Skin Assessment / Reassessment []  - Simple Wound Assessment / Reassessment - one wound 0 X- 2 5 Complex Wound Assessment / Reassessment - multiple wounds []  - 0 Dermatologic / Skin Assessment (not related to wound area) ASSESSMENTS - Focused Assessment []  - Circumferential Edema Measurements - multi extremities 0 []  - 0 Nutritional Assessment / Counseling / Intervention []  - 0 Lower Extremity Assessment (monofilament, tuning fork, pulses) []  - 0 Peripheral Arterial Disease Assessment (using hand held doppler) ASSESSMENTS - Ostomy and/or Continence Assessment and Care []  - Incontinence Assessment and Management 0 []  - 0 Ostomy Care Assessment and Management (repouching, etc.) PROCESS - Coordination of Care X - Simple Patient / Family Education for ongoing care 1 15 []  - 0 Complex (extensive) Patient / Family Education for ongoing care []  - 0 Staff obtains Programmer, systems, Records, Test Results / Process Orders []  - 0 Staff telephones HHA, Nursing Homes / Clarify orders / etc []  - 0 Routine Transfer to another Facility (non-emergent condition) []  - 0 Routine Hospital Admission (non-emergent condition) []  - 0 New Admissions / Biomedical engineer / Ordering NPWT, Apligraf, etc. []  - 0 Emergency Hospital Admission (emergent condition) X- 1 10 Simple Discharge Coordination MATRICE, HERRO ([id number]) []  - 0 Complex (extensive) Discharge Coordination PROCESS - Special Needs []  - Pediatric / Minor Patient Management 0 []  - 0 Isolation Patient Management []  - 0 Hearing / Language / Visual special needs []  - 0 Assessment of Community assistance (transportation, D/C planning, etc.) []  - 0 Additional  assistance / Altered mentation []  - 0 Support Surface(s) Assessment (bed, cushion, seat, etc.) INTERVENTIONS - Wound Cleansing / Measurement []  - Simple Wound Cleansing - one wound 0 X- 2 5 Complex  Wound Cleansing - multiple wounds X- 1 5 Wound Imaging (photographs - any number of wounds) []  - 0 Wound Tracing (instead of photographs) []  - 0 Simple Wound Measurement - one wound X- 2 5 Complex Wound Measurement - multiple wounds INTERVENTIONS - Wound Dressings X - Small Wound Dressing one or multiple wounds 1 10 []  - 0 Medium Wound Dressing one or multiple wounds []  - 0 Large Wound Dressing one or multiple wounds []  - 0 Application of Medications - topical []  - 0 Application of Medications - injection INTERVENTIONS - Miscellaneous []  - External ear exam 0 []  - 0 Specimen Collection (cultures, biopsies, blood, body fluids, etc.) []  - 0 Specimen(s) / Culture(s) sent or taken to Lab for analysis []  - 0 Patient Transfer (multiple staff / Civil Service fast streamer / Similar devices) []  - 0 Simple Staple / Suture removal (25 or less) []  - 0 Complex Staple / Suture removal (26 or more) []  - 0 Hypo / Hyperglycemic Management (close monitor of Blood Glucose) []  - 0 Ankle / Brachial Index (ABI) - do not check if billed separately X- 1 5 Vital Signs [name], Tennie J. ([id number]) Has the patient been seen at the hospital within the last three years: Yes Total Score: 90 Level Of Care: New/Established - Level 3 Electronic Signature(s) Signed: 06/03/2019 4:53:39 PM By: Harold Barban Entered By: Harold Barban on 06/03/2019 13:13:17 Heckart, [name] ([id number]) -------------------------------------------------------------------------------- Encounter Discharge Information Details Patient Name: [name]. Date of Service: 06/03/2019 12:45 PM Medical Record Number: [id number] Patient Account Number: [id number] Date of Birth/Sex: [birth year] (73 y.o. F) Treating RN: Army Melia Primary Care  Kamareon Sciandra: [name] Other Clinician: Referring Mylisa Brunson: [name] Treating Emary Zalar/Extender: Tito Dine in Treatment: 25 Encounter Discharge Information Items Discharge Condition: Stable Ambulatory Status: Ambulatory Discharge Destination: Home Transportation: Private Auto Accompanied By: self Schedule Follow-up Appointment: Yes Clinical Summary of Care: Electronic Signature(s) Signed: 06/03/2019 4:31:27 PM By: Army Melia Entered By: Army Melia on 06/03/2019 13:51:48 Eble, [name] ([id number]) -------------------------------------------------------------------------------- Lower Extremity Assessment Details Patient Name: LAVORA, BRISBON. Date of Service: 06/03/2019 12:45 PM Medical Record Number: [id number] Patient Account Number: [id number] Date of Birth/Sex: 1946/06/27 (73 y.o. F) Treating RN: Army Melia Primary Care Kingston Shawgo: [name] Other Clinician: Referring Shaquoia Miers: [name] Treating Avner Stroder/Extender: [name] Dillon Weeks in Treatment: 25 Edema Assessment Assessed: [Left: No] [Right: No] Edema: [Left: N] [Right: o] Vascular Assessment Pulses: Dorsalis Pedis Palpable: [Left:Yes] Electronic Signature(s) Signed: 06/03/2019 4:31:27 PM By: Army Melia Entered By: Army Melia on 06/03/2019 12:59:31 Mourer, [name] ([id number]) -------------------------------------------------------------------------------- Multi Wound Chart Details Patient Name: MIKAELAH, TROSTLE. Date of Service: 06/03/2019 12:45 PM Medical Record Number: [id number] Patient Account Number: [id number] Date of Birth/Sex: Oct 27, 1946 (73 y.o. F) Treating RN: Harold Barban Primary Care Farha Dano: [name] Other Clinician: Referring Nicanor Mendolia: [name] Treating Stefanee Mckell/Extender: Tito Dine in Treatment: 25 Vital Signs Height(in): 63 Pulse(bpm): 22 Weight(lbs): 224.7 Blood Pressure(mmHg): 127/68 Body Mass Index(BMI):  40 Temperature(F): 98.7 Respiratory Rate 18 (breaths/min): Photos: [N/A:N/A] Wound Location: Left Lower Leg - Medial Left Lower Leg - Lateral N/A Wounding Event: Gradually Appeared Gradually Appeared N/A Primary Etiology: Lymphedema Venous Leg Ulcer N/A Comorbid History: Cataracts, Asthma, Sleep Cataracts, Asthma, Sleep N/A Apnea, Deep Vein Apnea, Deep Vein Thrombosis, Hypertension, Thrombosis, Hypertension, Peripheral Venous Disease, Peripheral Venous Disease, Osteoarthritis, Received Osteoarthritis, Received Chemotherapy, Received Chemotherapy, Received Radiation Radiation Date Acquired: 11/19/2018 01/19/2019 N/A Weeks of Treatment: 25 19 N/A Wound Status: Open Open N/A Measurements L x W x  D 3.6x2.8x0.2 4x5x0.2 N/A (cm) Area (cm) : 7.917 15.708 N/A Volume (cm) : 1.583 3.142 N/A % Reduction in Area: 7.30% -7040.00% N/A % Reduction in Volume: -85.10% -14181.80% N/A Classification: Full Thickness Without Full Thickness Without N/A Exposed Support Structures Exposed Support Structures Exudate Amount: Large Large N/A Exudate Type: Purulent Purulent N/A Exudate Color: yellow, brown, green yellow, brown, green N/A Wound Margin: Indistinct, nonvisible Indistinct, nonvisible N/A Granulation Amount: Large (67-100%) Medium (34-66%) N/A Granulation Quality: Pink Pink N/A Necrotic Amount: Small (1-33%) Medium (34-66%) N/A Exposed Structures: N/A SHYLO, [name] ([id number]) Fat Layer (Subcutaneous Fat Layer (Subcutaneous Tissue) Exposed: Yes Tissue) Exposed: Yes Fascia: No Fascia: No Tendon: No Tendon: No Muscle: No Muscle: No Joint: No Joint: No Bone: No Bone: No Epithelialization: None None N/A Treatment Notes Electronic Signature(s) Signed: 06/03/2019 5:12:30 PM By: Linton Ham MD Entered By: Linton Ham on 06/03/2019 13:31:10 [name] ([id number]) -------------------------------------------------------------------------------- Multi-Disciplinary  Care Plan Details Patient Name: LAQUANA, VILLARI. Date of Service: 06/03/2019 12:45 PM Medical Record Number: [id number] Patient Account Number: [id number] Date of Birth/Sex: Mar 17, 1946 (73 y.o. F) Treating RN: Harold Barban Primary Care Neils [place]: [name] Other Clinician: Referring Cam Dauphin: [name] Treating Exavier Lina/Extender: Tito Dine in Treatment: 25 Active Inactive Orientation to the Wound Care Program Nursing Diagnoses: Knowledge deficit related to the wound healing center program Goals: Patient/caregiver will verbalize understanding of the [facility] Program Date Initiated: 12/10/2018 Target Resolution Date: 01/09/2019 Goal Status: Active Interventions: Provide education on orientation to the wound center Notes: Soft Tissue Infection Nursing Diagnoses: Impaired tissue integrity Goals: Patient's soft tissue infection will resolve Date Initiated: 12/10/2018 Target Resolution Date: 01/09/2019 Goal Status: Active Interventions: Assess signs and symptoms of infection every visit Notes: Venous Leg Ulcer Nursing Diagnoses: Actual venous Insuffiency (use after diagnosis is confirmed) Goals: Patient will maintain optimal edema control Date Initiated: 12/10/2018 Target Resolution Date: 01/09/2019 Goal Status: Active Interventions: Assess peripheral edema status every visit. ARADHYA, SHELLENBARGER ([id number]) Treatment Activities: Therapeutic compression applied : 12/10/2018 Notes: Wound/Skin Impairment Nursing Diagnoses: Impaired tissue integrity Goals: Patient/caregiver will verbalize understanding of skin care regimen Date Initiated: 12/10/2018 Target Resolution Date: 01/09/2019 Goal Status: Active Interventions: Assess ulceration(s) every visit Treatment Activities: Topical wound management initiated : 12/10/2018 Notes: Electronic Signature(s) Signed: 06/03/2019 4:53:39 PM By: Harold Barban Entered By: Harold Barban on 06/03/2019  13:11:09 Lafrance, [name] ([id number]) -------------------------------------------------------------------------------- Pain Assessment Details Patient Name: LAWREN, SEXSON. Date of Service: 06/03/2019 12:45 PM Medical Record Number: [id number] Patient Account Number: [id number] Date of Birth/Sex: 07-27-1946 (73 y.o. F) Treating RN: Harold Barban Primary Care Britanny Marksberry: [name] Other Clinician: Referring Manjot Beumer: [name] Treating Vikram Tillett/Extender: Tito Dine in Treatment: 25 Active Problems Location of Pain Severity and Description of Pain Patient Has Paino No Site Locations Pain Management and Medication Current Pain Management: Electronic Signature(s) Signed: 06/03/2019 4:24:39 PM By: Paulla Fore, RRT, CHT Signed: 06/03/2019 4:53:39 PM By: Harold Barban Entered By: Lorine Bears on 06/03/2019 12:49:27 Scholze, [name] ([id number]) -------------------------------------------------------------------------------- Patient/Caregiver Education Details Patient Name: SWETHA, RAYLE. Date of Service: 06/03/2019 12:45 PM Medical Record Number: [id number] Patient Account Number: [id number] Date of Birth/Gender: 1946/02/13 (73 y.o. F) Treating RN: Harold Barban Primary Care Physician: [name] Other Clinician: Referring Physician: [name] Treating Physician/Extender: Tito Dine in Treatment: 25 Education Assessment Education Provided To: Patient Education Topics Provided Wound/Skin Impairment: Handouts: Caring for Your Ulcer Methods: Demonstration, Explain/Verbal Responses: State content correctly Electronic Signature(s) Signed: 06/03/2019 4:53:39 PM By:  Harold Barban Entered By: Harold Barban on 06/03/2019 13:12:31 [name] ([id number]) -------------------------------------------------------------------------------- Wound Assessment Details Patient Name: EMALEE, KNIES. Date of Service: 06/03/2019 12:45 PM Medical Record Number: [id number] Patient Account Number: [id number] Date of Birth/Sex: 1946-09-11 (73 y.o. F) Treating RN: Army Melia Primary Care Demitra Danley: [name] Other Clinician: Referring Ryian Lynde: [name] Treating Dexton Zwilling/Extender: Tito Dine in Treatment: 25 Wound Status Wound Number: 5 Primary Lymphedema Etiology: Wound Location: Left Lower Leg - Medial Wound Open Wounding Event: Gradually Appeared Status: Date Acquired: 11/19/2018 Comorbid Cataracts, Asthma, Sleep Apnea, Deep Vein Weeks Of Treatment: 25 History: Thrombosis, Hypertension, Peripheral Venous Clustered Wound: No Disease, Osteoarthritis, Received Chemotherapy, Received Radiation Photos Wound Measurements Length: (cm) 3.6 Width: (cm) 2.8 Depth: (cm) 0.2 Area: (cm) 7.917 Volume: (cm) 1.583 % Reduction in Area: 7.3% % Reduction in Volume: -85.1% Epithelialization: None Tunneling: No Undermining: No Wound Description Full Thickness Without Exposed Support Classification: Structures Wound Margin: Indistinct, nonvisible Exudate Large Amount: Exudate Type: Purulent Exudate Color: yellow, brown, green Foul Odor After Cleansing: No Slough/Fibrino Yes Wound Bed Granulation Amount: Large (67-100%) Exposed Structure Granulation Quality: Pink Fascia Exposed: No Necrotic Amount: Small (1-33%) Fat Layer (Subcutaneous Tissue) Exposed: Yes Necrotic Quality: Adherent Slough Tendon Exposed: No Muscle Exposed: No Joint Exposed: No Bone Exposed: No Brocker, Estefanny J. ([id number]) Treatment Notes Wound #5 (Left, Medial Lower Leg) Notes sorbact, xtra sorb, 3 layer Electronic Signature(s) Signed: 06/03/2019 4:31:27 PM By: Army Melia Entered By: Army Melia on 06/03/2019 12:58:27 Peyser, [name] ([id number]) -------------------------------------------------------------------------------- Wound Assessment Details Patient Name:  RYLIN, SAEZ. Date of Service: 06/03/2019 12:45 PM Medical Record Number: [id number] Patient Account Number: [id number] Date of Birth/Sex: 12-Aug-1946 (73 y.o. F) Treating RN: Army Melia Primary Care Kimbella Heisler: [name] Other Clinician: Referring Darral Rishel: [name] Treating Anner Baity/Extender: Tito Dine in Treatment: 25 Wound Status Wound Number: 6 Primary Venous Leg Ulcer Etiology: Wound Location: Left Lower Leg - Lateral Wound Open Wounding Event: Gradually Appeared Status: Date Acquired: 01/19/2019 Comorbid Cataracts, Asthma, Sleep Apnea, Deep Vein Weeks Of Treatment: 19 History: Thrombosis, Hypertension, Peripheral Venous Clustered Wound: No Disease, Osteoarthritis, Received Chemotherapy, Received Radiation Photos Wound Measurements Length: (cm) 4 Width: (cm) 5 Depth: (cm) 0.2 Area: (cm) 15.708 Volume: (cm) 3.142 % Reduction in Area: -7040% % Reduction in Volume: -14181.8% Epithelialization: None Tunneling: No Undermining: No Wound Description Full Thickness Without Exposed Support Classification: Structures Wound Margin: Indistinct, nonvisible Exudate Large Amount: Exudate Type: Purulent Exudate Color: yellow, brown, green Foul Odor After Cleansing: No Slough/Fibrino Yes Wound Bed Granulation Amount: Medium (34-66%) Exposed Structure Granulation Quality: Pink Fascia Exposed: No Necrotic Amount: Medium (34-66%) Fat Layer (Subcutaneous Tissue) Exposed: Yes Necrotic Quality: Adherent Slough Tendon Exposed: No Muscle Exposed: No Joint Exposed: No Bone Exposed: No Leppla, Emmilee J. ([id number]) Treatment Notes Wound #6 (Left, Lateral Lower Leg) Notes sorbact, xtra sorb, 3 layer Electronic Signature(s) Signed: 06/03/2019 4:31:27 PM By: Army Melia Entered By: Army Melia on 06/03/2019 12:59:07 Plemons, [name] ([id number]) -------------------------------------------------------------------------------- Vitals  Details Patient Name: HILLIARY, JOCK. Date of Service: 06/03/2019 12:45 PM Medical Record Number: [id number] Patient Account Number: [id number] Date of Birth/Sex: [birth year] (73 y.o. F) Treating RN: Harold Barban Primary Care Janeese Mcgloin: [name] Other Clinician: Referring Wray Goehring: [name] Treating Sayer Masini/Extender: Tito Dine in Treatment: 25 Vital Signs Time Taken: 12:47 Temperature (F): 98.7 Height (in): 63 Pulse (bpm): 74 Weight (lbs): 224.7 Respiratory Rate (breaths/min): 18 Body Mass Index (BMI): 39.8 Blood Pressure (mmHg): 127/68 Reference Range: 80 - 120  mg / dl Electronic Signature(s) Signed: 06/03/2019 4:24:39 PM By: Lorine Bears RCP, RRT, CHT Entered By: Lorine Bears on 06/03/2019 12:50:38

## 2019-06-05 ENCOUNTER — Ambulatory Visit (HOSPITAL_COMMUNITY)
Admission: RE | Admit: 2019-06-05 | Discharge: 2019-06-05 | Disposition: A | Discharge status: Home / Self Care | Payer: Medicare Other | Source: Ambulatory Visit | Attending: Vascular Surgery | Admitting: Vascular Surgery

## 2019-06-05 ENCOUNTER — Other Ambulatory Visit: Payer: Self-pay

## 2019-06-05 ENCOUNTER — Ambulatory Visit (INDEPENDENT_AMBULATORY_CARE_PROVIDER_SITE_OTHER): Payer: Medicare Other | Admitting: Vascular Surgery

## 2019-06-05 ENCOUNTER — Encounter: Payer: Self-pay | Admitting: Vascular Surgery

## 2019-06-05 VITALS — BP 130/81 | HR 88 | Temp 97.4°F | Resp 18 | Ht 63.0 in | Wt 223.0 lb

## 2019-06-05 DIAGNOSIS — I872 Venous insufficiency (chronic) (peripheral): Secondary | ICD-10-CM | POA: Diagnosis not present

## 2019-06-05 NOTE — Progress Notes (Signed)
Patient ID: [name], female   DOB: 13-May-1946, 73 y.o.   MRN: [id number]  Reason for Consult: Follow-up   Referred by [name], MD  Subjective:     HPI:  DONDA FRIEDLI is a 73 y.o. female is undergone saphenous vein ablation Dr. Kellie Simmering 2016.  Now has recurrent ulcer.  Wound had previously healed but opened up again approximately 1 month ago.  She has had significantly increased edema recently has been placed on Lasix and metolazone.  Patient cannot tolerate compression stockings but is undergoing compression therapy with Dr. Dellia Nims at the wound care center.  Has a left medial ulceration which is much improved with care.  Left 1 also healing but is the newest one.  Does have varicosities in her thigh tracing on the left lateral aspect of her leg was offered staff lobectomy with Dr. Kellie Simmering few years ago given that this is mostly cosmetic she does not want to pursue this.  Does not have issues in the right lower extremity.  States that she has been treated for pseudogout as well as neuropathy and now her only pain is associated with the ulcerations themselves.  She is also planning to undergo right sided carpal tunnel release in the near future.  Past Medical History:  Diagnosis Date  . Allergy   . Anesthesia complication    trouble waking up 2/2 CPAP  . Arthritis   . Asthma in adult   . Cataract 2019   left eye resolved with surgery  . Cervical spondylosis 2012   Jacelyn Grip, [place])  . Choroidal nevus of left eye 03/22/2016  . Chronic venous insufficiency 2016   severe reflux with painful varicosities sees [facility]  . Clotting disorder ([place])    due to vein ablation   . Complication of anesthesia    difficulty coming out due to sleep apnea per pt  . Difficult intubation    PATIENT DENIES   . Diverticulosis    per ct scans 09-2016 and 01-2017  . DVT (deep venous thrombosis) ([place]) 2011   small, developed after venous ablation  . DVT (deep venous thrombosis) ([place])   . Family  history of adverse reaction to anesthesia    O2 levels drop upon waking   . Hearing loss sensory, bilateral 2013   mod-severe high freq sensorineural ([facility])  . Hiatal hernia     09-2016 ct scan [place] at cancer center   . History of kidney stones 1980s  . History of radiation therapy 07/19/11-08/25/2011   RIGHT AXILLARY REGION/METASTATIC  . Hypertension   . Melanoma ([place]) 06/29/10   MALIGNANT MELANOMA R SHOULDER/SUPRASCAPULAR BACK s/p interferon chemo and XRT  . Neuromuscular disorder ([facility])    muscle spasms  . OSA (obstructive sleep apnea)    on CPAP  . Pneumonia    2001  . RLS (restless legs syndrome)   . Seasonal allergies   . Sleep apnea    wears cpap  . Tinnitus   . Unspecified vitamin D deficiency 03/18/2013  . Varicose veins of left lower extremity    Family History  Problem Relation Age of Onset  . Cancer Mother 51       uterine  . CAD Father 60       MI  . Hypertension Father 24  . Alzheimer's disease Father 52  . Cancer Cousin        x2, breast  . Diabetes Sister   . Cancer Paternal Grandmother  melanoma, possibly  . Colon cancer Neg Hx   . Colon polyps Neg Hx   . Rectal cancer Neg Hx   . Stomach cancer Neg Hx    Past Surgical History:  Procedure Laterality Date  . ABI  2016   WNL  . ANTERIOR CERVICAL DECOMP/DISCECTOMY FUSION N/A 08/22/2015   ANTERIOR CERVICAL DECOMPRESSION FUSION C3/4 - interbody graft and anterior plate; Kristeen Miss, MD  . ANTERIOR CERVICAL DECOMP/DISCECTOMY FUSION N/A 07/02/2016   Procedure: Cervical four- five Cervical five- six Cervical six- seven Anterior cervical decompression/diskectomy/fusion;  Surgeon: Kristeen Miss, MD;  Location: [facility];  Service: Neurosurgery;  Laterality: N/A;  C4-5 C5-6 C6-7 Anterior cervical decompression/diskectomy/fusion  . BREAST BIOPSY Left 2013   BENIGN  . BREAST SURGERY Right 1990   BX   . CARDIOVASCULAR STRESS TEST  2010   normal stress test, EF 66%  . CATARACT EXTRACTION W/  INTRAOCULAR LENS IMPLANT Left 2019   Dr. Kathrin Penner  . COLONOSCOPY  03/2005   benign polyp, int hem, rpt 5 yrs ([place], [place])  . COLONOSCOPY WITH PROPOFOL N/A 01/06/2018   diverticulosis, decreased sphincter tone, no f/u needed [name], [place]ke Corin, MD)  . DEEP AXILLARY SENTINEL NODE BIOPSY / EXCISION  2012   RIGHT  . DEXA  06/2016   WNL  . DILATION AND CURETTAGE OF UTERUS  2010  . ENDOVENOUS ABLATION SAPHENOUS VEIN W/ LASER Left 2010   [facility]  . ENDOVENOUS ABLATION SAPHENOUS VEIN W/ LASER Left 2016   accessory branch [facility] Kellie Simmering)  . ESOPHAGOGASTRODUODENOSCOPY  2006   mod chronic carditis, no H pylori ([place], [place])  . [place]   left  . LUMBAR LAMINECTOMY  2001   L4/5  . MELANOMA EXCISION  2011   Right shoulder, with sentinel lymph node biopsy  . PORT-A-CATH REMOVAL  12/05/2011   Procedure: REMOVAL PORT-A-CATH;  Surgeon: Rolm Bookbinder, MD;  Location: [facility];  Service: General;  Laterality: N/A;  left port removal  . PORTACATH PLACEMENT     left subclavian  . UPPER GASTROINTESTINAL ENDOSCOPY      Short Social History:  Social History   Tobacco Use  . Smoking status: Former Smoker    Types: Cigarettes    Quit date: 11/05/1965    Years since quitting: 53.6  . Smokeless tobacco: Never Used  . Tobacco comment: socially as a teen  Substance Use Topics  . Alcohol use: No    Alcohol/week: 0.0 standard drinks    Allergies  Allergen Reactions  . Sulfa Antibiotics Itching, Swelling and Shortness Of Breath    Facial swelling  . Voltaren [Diclofenac Sodium] Shortness Of Breath  . Latex Other (See Comments)    Blisters ONLY HAD REACTION TO TAPE  (??? ONLY ADHESIVE ALLERGY)  . Tape Other (See Comments)    Caused blisters - must use paper tape - same reaction from NeoPrene  . Other     Neoprene    Current Outpatient Medications  Medication Sig Dispense Refill  . acetaminophen (TYLENOL ARTHRITIS PAIN) 650 MG CR tablet Take 1,300 mg by  mouth every 8 (eight) hours as needed for pain.     Marland Kitchen albuterol (PROVENTIL HFA;VENTOLIN HFA) 108 (90 Base) MCG/ACT inhaler INHALE 2 PUFFS INTO LUNGS EVERY 6 HOURS AS NEEDED FOR WHEEZING OR SHORTNESS OF BREATH 8.5 Inhaler 3  . albuterol (PROVENTIL) (2.5 MG/3ML) 0.083% nebulizer solution Take 3 mLs (2.5 mg total) by nebulization every 6 (six) hours as needed for wheezing  or shortness of breath. 150 mL 1  . aspirin EC 81 MG tablet Take 81 mg by mouth at bedtime. Melanoma prevention    . azelastine (ASTELIN) 0.1 % nasal spray Place 1 spray into both nostrils 2 (two) times daily. Use in each nostril as directed 30 mL 3  . famotidine (PEPCID) 10 MG tablet Take 10 mg by mouth daily.    . furosemide (LASIX) 40 MG tablet Take 1 tablet (40 mg total) by mouth daily as needed for edema (take extra lasix 40mg  in afternoons as needed). 30 tablet 0  . losartan (COZAAR) 25 MG tablet TAKE 2 TABLETS BY MOUTH EVERY DAY 60 tablet 5  . meloxicam (MOBIC) 15 MG tablet Take 15 mg by mouth daily. As needed  3  . montelukast (SINGULAIR) 10 MG tablet TAKE 1 TABLET BY MOUTH EVERY DAY 30 tablet 2  . Multiple Vitamin (MULTIVITAMIN WITH MINERALS) TABS tablet Take 1 tablet by mouth daily.    . Polyvinyl Alcohol-Povidone (TEARS PLUS OP) Place 1 drop into both eyes daily as needed (dry eyes/ redness/ burning).    . potassium chloride SA (K-DUR,KLOR-CON) 20 MEQ tablet Take 1 tablet (20 mEq total) by mouth daily as needed (take one extra potassium with lasix). 30 tablet 0  . PRESCRIPTION MEDICATION CPAP    . Probiotic Product (PROBIOTIC DAILY PO) Take 1 tablet by mouth daily.     Marland Kitchen triamcinolone (NASACORT ALLERGY 24HR) 55 MCG/ACT AERO nasal inhaler Place 2 sprays into the nose daily. In each nostril    . UNABLE TO FIND Take by mouth.    . Vitamin D, Ergocalciferol, (DRISDOL) 1.25 MG (50000 UT) CAPS capsule TAKE 1 CAPSULE (50,000 UNITS TOTAL) BY MOUTH EVERY 7 (SEVEN) DAYS. 12 capsule 3   No current facility-administered medications  for this visit.     Review of Systems  Constitutional:  Constitutional negative. HENT: HENT negative.  Eyes: Eyes negative.  Respiratory: Respiratory negative.  Cardiovascular: Positive for leg swelling.  GI: Gastrointestinal negative.  Musculoskeletal: Positive for joint pain.  Skin: Positive for wound.  Neurological: Positive for numbness.  Hematologic: Hematologic/lymphatic negative.  Psychiatric: Psychiatric negative.        Objective:   Vitals:   06/05/19 1453  BP: 130/81  Pulse: 88  Resp: 18  Temp: (!) 97.4 F (36.3 C)  SpO2: 98%    Physical Exam HENT:     Head: Normocephalic.     Nose: Nose normal.  Eyes:     Pupils: Pupils are equal, round, and reactive to light.  Cardiovascular:     Rate and Rhythm: Normal rate.     Pulses:          Radial pulses are 2+ on the right side and 2+ on the left side.       Popliteal pulses are 2+ on the right side and 2+ on the left side.       Posterior tibial pulses are 2+ on the right side and 2+ on the left side.  Pulmonary:     Effort: Pulmonary effort is normal.  Abdominal:     General: Abdomen is flat.     Palpations: Abdomen is soft.  Musculoskeletal:        General: Swelling present.     Right lower leg: No edema.     Left lower leg: Edema present.  Skin:    Capillary Refill: Capillary refill takes less than 2 seconds.     Comments: Ulceration on the left medial and lateral  leg.  Skin changes consistent with venous insufficiency  Neurological:     General: No focal deficit present.     Mental Status: She is alert.  Psychiatric:        Mood and Affect: Mood normal.        Behavior: Behavior normal.        Thought Content: Thought content normal.        Judgment: Judgment normal.     Data: I independently interpreted her left lower extremity reflux study.  Demonstrates reflux in the common femoral vein and femoral vein up to 6500 ms.  Greater saphenous vein has reflux in the saphenofemoral junction proximal  thigh up to 3700 ms.  Vein diameters 1.1 cm saphenofemoral junction proximal thigh 0.78 in the proximal calf 0.4 cm.     Assessment/Plan:     73 year old female with left lower extremity ulceration appears venous in nature.  There are palpable posterior tibial pulses bilaterally to suggest that there is not an arterial component.  From a superficial venous standpoint it appears we have really treated her greater saphenous vein adequately small saphenous vein does not appear to be involved significantly with greater size 0.37 cm.  She does have significant deep venous reflux which unfortunately is not treatable.  I do worry that possibly she has an obstructive component to this leading to the reflux.  I have offered her central venography from a prone left popliteal approach given that this is the left lower extremity and certainly the risk of having a compression with a may Thurner syndrome could be certainly contributing to her issues.  Previous CT scan did not really seem to demonstrate any compression issues this was a few years ago.  At this time she does not want to schedule venography but will call if she wants to in the future.     Waynetta Sandy MD [facility]

## 2019-06-08 ENCOUNTER — Encounter: Payer: Medicare Other | Attending: Physician Assistant

## 2019-06-08 ENCOUNTER — Other Ambulatory Visit: Payer: Self-pay

## 2019-06-08 DIAGNOSIS — Z86718 Personal history of other venous thrombosis and embolism: Secondary | ICD-10-CM | POA: Insufficient documentation

## 2019-06-08 DIAGNOSIS — J45909 Unspecified asthma, uncomplicated: Secondary | ICD-10-CM | POA: Diagnosis not present

## 2019-06-08 DIAGNOSIS — E876 Hypokalemia: Secondary | ICD-10-CM | POA: Diagnosis not present

## 2019-06-08 DIAGNOSIS — I10 Essential (primary) hypertension: Secondary | ICD-10-CM | POA: Insufficient documentation

## 2019-06-08 DIAGNOSIS — I872 Venous insufficiency (chronic) (peripheral): Secondary | ICD-10-CM | POA: Insufficient documentation

## 2019-06-08 DIAGNOSIS — G4733 Obstructive sleep apnea (adult) (pediatric): Secondary | ICD-10-CM | POA: Insufficient documentation

## 2019-06-08 DIAGNOSIS — M1990 Unspecified osteoarthritis, unspecified site: Secondary | ICD-10-CM | POA: Insufficient documentation

## 2019-06-08 DIAGNOSIS — I890 Lymphedema, not elsewhere classified: Secondary | ICD-10-CM | POA: Diagnosis not present

## 2019-06-08 DIAGNOSIS — L97221 Non-pressure chronic ulcer of left calf limited to breakdown of skin: Secondary | ICD-10-CM | POA: Diagnosis not present

## 2019-06-08 DIAGNOSIS — G629 Polyneuropathy, unspecified: Secondary | ICD-10-CM | POA: Insufficient documentation

## 2019-06-08 DIAGNOSIS — M329 Systemic lupus erythematosus, unspecified: Secondary | ICD-10-CM | POA: Insufficient documentation

## 2019-06-08 NOTE — Progress Notes (Signed)
[name], [name] ([id number]) Visit Report for 06/08/2019 Arrival Information Details Patient Name: [name], [name]. Date of Service: 06/08/2019 8:30 AM Medical Record Number: [id number] Patient Account Number: [id number] Date of Birth/Sex: 16-May-1946 (73 y.o. F) Treating RN: Montey Hora Primary Care Lela Gell: [name] Other Clinician: Referring Shareka Casale: [name] Treating Jelisha Weed/Extender: Melburn Hake, HOYT Weeks in Treatment: 25 Visit Information History Since Last Visit Added or deleted any medications: No Patient Arrived: Ambulatory Any new allergies or adverse reactions: No Arrival Time: 08:40 Had a fall or experienced change in No Accompanied By: self activities of daily living that may affect Transfer Assistance: None risk of falls: Patient Identification Verified: Yes Signs or symptoms of abuse/neglect since last visito No Secondary Verification Process Completed: Yes Hospitalized since last visit: No Implantable device outside of the clinic excluding No cellular tissue based products placed in the center since last visit: Has Dressing in Place as Prescribed: Yes Has Compression in Place as Prescribed: Yes Pain Present Now: No Electronic Signature(s) Signed: 06/08/2019 3:29:41 PM By: Montey Hora Entered By: Montey Hora on 06/08/2019 08:40:51 [place], [name] ([id number]) -------------------------------------------------------------------------------- Compression Therapy Details Patient Name: [name], [name]. Date of Service: 06/08/2019 8:30 AM Medical Record Number: [id number] Patient Account Number: [id number] Date of Birth/Sex: Feb 20, 1946 (73 y.o. F) Treating RN: Montey Hora Primary Care Keirsten Matuska: [name] Other Clinician: Referring Alivea Gladson: [name] Treating Haset [place]/Extender: STONE III, HOYT Weeks in Treatment: 25 Compression Therapy Performed for Wound Assessment: Wound #5 Left,Medial Lower Leg Performed By: Clinician  Montey Hora, RN Compression Type: Three Layer Pre Treatment ABI: 1 Electronic Signature(s) Signed: 06/08/2019 3:29:41 PM By: Montey Hora Entered By: Montey Hora on 06/08/2019 08:49:36 [name], [name] ([id number]) -------------------------------------------------------------------------------- Compression Therapy Details Patient Name: [name], [name]. Date of Service: 06/08/2019 8:30 AM Medical Record Number: [id number] Patient Account Number: [id number] Date of Birth/Sex: 09-01-1946 (73 y.o. F) Treating RN: Montey Hora Primary Care Mylin Hirano: [name] Other Clinician: Referring Rakeya Glab: [name] Treating Jaxie Racanelli/Extender: STONE III, HOYT Weeks in Treatment: 25 Compression Therapy Performed for Wound Assessment: Wound #6 Left,Lateral Lower Leg Performed By: Clinician Montey Hora, RN Compression Type: Three Layer Pre Treatment ABI: 1 Electronic Signature(s) Signed: 06/08/2019 3:29:41 PM By: Montey Hora Entered By: Montey Hora on 06/08/2019 08:49:36 [name], [name] ([id number]) -------------------------------------------------------------------------------- Encounter Discharge Information Details Patient Name: [name], [name]. Date of Service: 06/08/2019 8:30 AM Medical Record Number: [id number] Patient Account Number: [id number] Date of Birth/Sex: [birth year] (72 y.o. F) Treating RN: Montey Hora Primary Care Jackie Russman: [name] Other Clinician: Referring Jonel Weldon: [name] Treating Elaf Clauson/Extender: Melburn Hake, HOYT Weeks in Treatment: 25 Encounter Discharge Information Items Discharge Condition: Stable Ambulatory Status: Ambulatory Discharge Destination: Home Transportation: Private Auto Accompanied By: self Schedule Follow-up Appointment: Yes Clinical Summary of Care: Electronic Signature(s) Signed: 06/08/2019 3:29:41 PM By: Montey Hora Entered By: Montey Hora on 06/08/2019 08:50:20 [name], [name]  ([id number]) -------------------------------------------------------------------------------- Wound Assessment Details Patient Name: [name], [name]. Date of Service: 06/08/2019 8:30 AM Medical Record Number: [id number] Patient Account Number: [id number] Date of Birth/Sex: 1946-03-15 (73 y.o. F) Treating RN: Montey Hora Primary Care Anyla Israelson: [name] Other Clinician: Referring Starleen Trussell: [name] Treating Kalise Fickett/Extender: STONE III, HOYT Weeks in Treatment: 25 Wound Status Wound Number: 5 Primary Lymphedema Etiology: Wound Location: Left Lower Leg - Medial Wound Open Wounding Event: Gradually Appeared Status: Date Acquired: 11/19/2018 Comorbid Cataracts, Asthma, Sleep Apnea, Deep Vein Weeks Of Treatment: 25 History: Thrombosis, Hypertension, Peripheral Venous Clustered Wound: No Disease, Osteoarthritis, Received Chemotherapy, Received Radiation Photos Wound Measurements Length: (  cm) 2.5 Width: (cm) 2.8 Depth: (cm) 0.2 Area: (cm) 5.498 Volume: (cm) 1.1 % Reduction in Area: 35.7% % Reduction in Volume: -28.7% Epithelialization: None Tunneling: No Undermining: No Wound Description Full Thickness Without Exposed Support Foul Odo Classification: Structures Slough/F Wound Margin: Indistinct, nonvisible Exudate Large Amount: Exudate Type: Purulent Exudate Color: yellow, brown, green r After Cleansing: No ibrino Yes Wound Bed Granulation Amount: Large (67-100%) Exposed Structure Granulation Quality: Pink Fascia Exposed: No Necrotic Amount: Small (1-33%) Fat Layer (Subcutaneous Tissue) Exposed: Yes Necrotic Quality: Adherent Slough Tendon Exposed: No Muscle Exposed: No Joint Exposed: No Bone Exposed: No [name], [name] ([id number]) Treatment Notes Wound #5 (Left, Medial Lower Leg) Notes sorbact, abd, 3 layer wrap Electronic Signature(s) Signed: 06/08/2019 3:29:41 PM By: Montey Hora Entered By: Montey Hora on 06/08/2019  08:48:45 [name], [name] ([id number]) -------------------------------------------------------------------------------- Wound Assessment Details Patient Name: [name], [name]. Date of Service: 06/08/2019 8:30 AM Medical Record Number: [id number] Patient Account Number: [id number] Date of Birth/Sex: 04/08/1946 (73 y.o. F) Treating RN: Montey Hora Primary Care Shemicka Cohrs: [name] Other Clinician: Referring Zohar Maroney: [name] Treating Greysyn Vanderberg/Extender: STONE III, HOYT Weeks in Treatment: 25 Wound Status Wound Number: 6 Primary Venous Leg Ulcer Etiology: Wound Location: Left Lower Leg - Lateral Wound Open Wounding Event: Gradually Appeared Status: Date Acquired: 01/19/2019 Comorbid Cataracts, Asthma, Sleep Apnea, Deep Vein Weeks Of Treatment: 20 History: Thrombosis, Hypertension, Peripheral Venous Clustered Wound: No Disease, Osteoarthritis, Received Chemotherapy, Received Radiation Photos Wound Measurements Length: (cm) 3.2 Width: (cm) 3.5 Depth: (cm) 0.2 Area: (cm) 8.796 Volume: (cm) 1.759 % Reduction in Area: -3898.2% % Reduction in Volume: -7895.5% Epithelialization: None Tunneling: No Undermining: No Wound Description Full Thickness Without Exposed Support Foul Odo Classification: Structures Slough/F Wound Margin: Indistinct, nonvisible Exudate Large Amount: Exudate Type: Purulent Exudate Color: yellow, brown, green r After Cleansing: No ibrino Yes Wound Bed Granulation Amount: Medium (34-66%) Exposed Structure Granulation Quality: Pink Fascia Exposed: No Necrotic Amount: Medium (34-66%) Fat Layer (Subcutaneous Tissue) Exposed: Yes Necrotic Quality: Adherent Slough Tendon Exposed: No Muscle Exposed: No Joint Exposed: No Bone Exposed: No Schiavo, Shauntel J. ([id number]) Treatment Notes Wound #6 (Left, Lateral Lower Leg) Notes sorbact, abd, 3 layer wrap Electronic Signature(s) Signed: 06/08/2019 3:29:41 PM By: Montey Hora Entered By:  Montey Hora on 06/08/2019 08:49:12

## 2019-06-10 ENCOUNTER — Other Ambulatory Visit: Payer: Self-pay

## 2019-06-10 ENCOUNTER — Encounter: Payer: Medicare Other | Admitting: Internal Medicine

## 2019-06-10 DIAGNOSIS — L97221 Non-pressure chronic ulcer of left calf limited to breakdown of skin: Secondary | ICD-10-CM | POA: Diagnosis not present

## 2019-06-10 DIAGNOSIS — L97322 Non-pressure chronic ulcer of left ankle with fat layer exposed: Secondary | ICD-10-CM | POA: Diagnosis not present

## 2019-06-10 DIAGNOSIS — I10 Essential (primary) hypertension: Secondary | ICD-10-CM | POA: Diagnosis not present

## 2019-06-10 DIAGNOSIS — M79662 Pain in left lower leg: Secondary | ICD-10-CM | POA: Diagnosis not present

## 2019-06-10 DIAGNOSIS — I890 Lymphedema, not elsewhere classified: Secondary | ICD-10-CM | POA: Diagnosis not present

## 2019-06-10 DIAGNOSIS — L97222 Non-pressure chronic ulcer of left calf with fat layer exposed: Secondary | ICD-10-CM | POA: Diagnosis not present

## 2019-06-10 DIAGNOSIS — E876 Hypokalemia: Secondary | ICD-10-CM | POA: Diagnosis not present

## 2019-06-10 DIAGNOSIS — G4733 Obstructive sleep apnea (adult) (pediatric): Secondary | ICD-10-CM | POA: Diagnosis not present

## 2019-06-10 DIAGNOSIS — L539 Erythematous condition, unspecified: Secondary | ICD-10-CM | POA: Diagnosis not present

## 2019-06-10 DIAGNOSIS — G629 Polyneuropathy, unspecified: Secondary | ICD-10-CM | POA: Diagnosis not present

## 2019-06-10 NOTE — Progress Notes (Signed)
[name] ([id number]) Visit Report for 06/10/2019 Arrival Information Details Patient Name: [name], [name]. Date of Service: 06/10/2019 12:45 PM Medical Record Number: [id number] Patient Account Number: [id number] Date of Birth/Sex: 1946/10/06 (73 y.o. F) Treating RN: Army Melia Primary Care Blen Ransome: [name] Other Clinician: Referring Elaura Calix: [name] Treating Makenzey Nanni/Extender: Beverly Gust in Treatment: 26 Visit Information History Since Last Visit Added or deleted any medications: No Patient Arrived: Ambulatory Any new allergies or adverse reactions: No Arrival Time: 12:34 Had a fall or experienced change in No Accompanied By: self activities of daily living that may affect Transfer Assistance: None risk of falls: Signs or symptoms of abuse/neglect since last visito No Hospitalized since last visit: No Has Dressing in Place as Prescribed: Yes Pain Present Now: No Electronic Signature(s) Signed: 06/10/2019 4:04:06 PM By: Army Melia Entered By: Army Melia on 06/10/2019 12:35:14 [name] ([id number]) -------------------------------------------------------------------------------- Clinic Level of Care Assessment Details Patient Name: [name], [name]. Date of Service: 06/10/2019 12:45 PM Medical Record Number: [id number] Patient Account Number: [id number] Date of Birth/Sex: 1946-02-20 (73 y.o. F) Treating RN: Harold Barban Primary Care Xylina Rhoads: [name] Other Clinician: Referring Leandro Berkowitz: [name] Treating Darlen Gledhill/Extender: Beverly Gust in Treatment: 26 Clinic Level of Care Assessment Items TOOL 4 Quantity Score []  - Use when only an EandM is performed on FOLLOW-UP visit 0 ASSESSMENTS - Nursing Assessment / Reassessment X - Reassessment of Co-morbidities (includes updates in patient status) 1 10 X- 1 5 Reassessment of Adherence to Treatment Plan ASSESSMENTS - Wound and Skin Assessment /  Reassessment []  - Simple Wound Assessment / Reassessment - one wound 0 X- 2 5 Complex Wound Assessment / Reassessment - multiple wounds []  - 0 Dermatologic / Skin Assessment (not related to wound area) ASSESSMENTS - Focused Assessment []  - Circumferential Edema Measurements - multi extremities 0 []  - 0 Nutritional Assessment / Counseling / Intervention []  - 0 Lower Extremity Assessment (monofilament, tuning fork, pulses) []  - 0 Peripheral Arterial Disease Assessment (using hand held doppler) ASSESSMENTS - Ostomy and/or Continence Assessment and Care []  - Incontinence Assessment and Management 0 []  - 0 Ostomy Care Assessment and Management (repouching, etc.) PROCESS - Coordination of Care X - Simple Patient / Family Education for ongoing care 1 15 []  - 0 Complex (extensive) Patient / Family Education for ongoing care []  - 0 Staff obtains Programmer, systems, Records, Test Results / Process Orders []  - 0 Staff telephones HHA, Nursing Homes / Clarify orders / etc []  - 0 Routine Transfer to another Facility (non-emergent condition) []  - 0 Routine Hospital Admission (non-emergent condition) []  - 0 New Admissions / Biomedical engineer / Ordering NPWT, Apligraf, etc. []  - 0 Emergency Hospital Admission (emergent condition) X- 1 10 Simple Discharge Coordination [name], [name] ([id number]) []  - 0 Complex (extensive) Discharge Coordination PROCESS - Special Needs []  - Pediatric / Minor Patient Management 0 []  - 0 Isolation Patient Management []  - 0 Hearing / Language / Visual special needs []  - 0 Assessment of Community assistance (transportation, D/C planning, etc.) []  - 0 Additional assistance / Altered mentation []  - 0 Support Surface(s) Assessment (bed, cushion, seat, etc.) INTERVENTIONS - Wound Cleansing / Measurement []  - Simple Wound Cleansing - one wound 0 X- 2 5 Complex Wound Cleansing - multiple wounds []  - 0 Wound Imaging (photographs - any number of wounds) X- 1  5 Wound Tracing (instead of photographs) []  - 0 Simple Wound Measurement - one wound X- 2 5 Complex Wound Measurement - multiple wounds  INTERVENTIONS - Wound Dressings []  - Small Wound Dressing one or multiple wounds 0 X- 2 15 Medium Wound Dressing one or multiple wounds []  - 0 Large Wound Dressing one or multiple wounds X- 1 5 Application of Medications - topical []  - 0 Application of Medications - injection INTERVENTIONS - Miscellaneous []  - External ear exam 0 []  - 0 Specimen Collection (cultures, biopsies, blood, body fluids, etc.) []  - 0 Specimen(s) / Culture(s) sent or taken to Lab for analysis []  - 0 Patient Transfer (multiple staff / Civil Service fast streamer / Similar devices) []  - 0 Simple Staple / Suture removal (25 or less) []  - 0 Complex Staple / Suture removal (26 or more) []  - 0 Hypo / Hyperglycemic Management (close monitor of Blood Glucose) []  - 0 Ankle / Brachial Index (ABI) - do not check if billed separately X- 1 5 Vital Signs [name], [name] ([id number]) Has the patient been seen at the hospital within the last three years: Yes Total Score: 115 Level Of Care: New/Established - Level 3 Electronic Signature(s) Signed: 06/10/2019 3:45:57 PM By: Harold Barban Entered By: Harold Barban on 06/10/2019 12:54:53 [name] ([id number]) -------------------------------------------------------------------------------- Encounter Discharge Information Details Patient Name: [name], [name]. Date of Service: 06/10/2019 12:45 PM Medical Record Number: [id number] Patient Account Number: [id number] Date of Birth/Sex: 23-May-1946 (73 y.o. F) Treating RN: Army Melia Primary Care Taci Sterling: [name] Other Clinician: Referring Kamden Reber: [name] Treating Archana Eckman/Extender: Beverly Gust in Treatment: 26 Encounter Discharge Information Items Discharge Condition: Stable Ambulatory Status: Ambulatory Discharge Destination: Home Transportation: Private  Auto Accompanied By: self Schedule Follow-up Appointment: Yes Clinical Summary of Care: Electronic Signature(s) Signed: 06/10/2019 4:04:06 PM By: Army Melia Entered By: Army Melia on 06/10/2019 13:14:16 [name] ([id number]) -------------------------------------------------------------------------------- Lower Extremity Assessment Details Patient Name: [name], [name]. Date of Service: 06/10/2019 12:45 PM Medical Record Number: [id number] Patient Account Number: [id number] Date of Birth/Sex: Mar 08, 1946 (73 y.o. F) Treating RN: Army Melia Primary Care Kalisha Keadle: [name] Other Clinician: Referring Doloris Servantes: [name] Treating [name] [name]/Extender: Beverly Gust in Treatment: 26 Edema Assessment Assessed: [Left: No] [Right: No] Edema: [Left: N] [Right: o] Vascular Assessment Pulses: Dorsalis Pedis Palpable: [Left:Yes] Electronic Signature(s) Signed: 06/10/2019 4:04:06 PM By: Army Melia Entered By: Army Melia on 06/10/2019 12:46:10 Hayton, Tenna [name] ([id number]) -------------------------------------------------------------------------------- Multi Wound Chart Details Patient Name: [name], [name]. Date of Service: 06/10/2019 12:45 PM Medical Record Number: [id number] Patient Account Number: [id number] Date of Birth/Sex: December 03, 1945 (74 y.o. F) Treating RN: Harold Barban Primary Care Nazir Hacker: [name] Other Clinician: Referring Carmeline Kowal: [name] Treating Kathrynn Backstrom/Extender: Beverly Gust in Treatment: 26 Vital Signs Height(in): 63 Pulse(bpm): 71 Weight(lbs): 224.7 Blood Pressure(mmHg): 149/79 Body Mass Index(BMI): 40 Temperature(F): 99.2 Respiratory Rate 16 (breaths/min): Photos: [N/A:N/A] Wound Location: Left Lower Leg - Medial Left Lower Leg - Lateral N/A Wounding Event: Gradually Appeared Gradually Appeared N/A Primary Etiology: Lymphedema Venous Leg Ulcer N/A Comorbid History: Cataracts, Asthma, Sleep  Cataracts, Asthma, Sleep N/A Apnea, Deep Vein Apnea, Deep Vein Thrombosis, Hypertension, Thrombosis, Hypertension, Peripheral Venous Disease, Peripheral Venous Disease, Osteoarthritis, Received Osteoarthritis, Received Chemotherapy, Received Chemotherapy, Received Radiation Radiation Date Acquired: 11/19/2018 01/19/2019 N/A Weeks of Treatment: 26 20 N/A Wound Status: Open Open N/A Measurements L x W x D 4.2x4.5x0.2 2x2.3x0.2 N/A (cm) Area (cm) : 14.844 3.613 N/A Volume (cm) : 2.969 0.723 N/A % Reduction in Area: -73.70% -1542.30% N/A % Reduction in Volume: -247.30% -3186.40% N/A Classification: Full Thickness Without Full Thickness Without N/A Exposed Support Structures Exposed Support Structures Exudate  Amount: Large Large N/A Exudate Type: Purulent Purulent N/A Exudate Color: yellow, brown, green yellow, brown, green N/A Wound Margin: Indistinct, nonvisible Indistinct, nonvisible N/A Granulation Amount: Medium (34-66%) Medium (34-66%) N/A Granulation Quality: Pink Pink N/A Necrotic Amount: Medium (34-66%) Medium (34-66%) N/A Exposed Structures: N/A BRYONA, [name] ([id number]) Fat Layer (Subcutaneous Fat Layer (Subcutaneous Tissue) Exposed: Yes Tissue) Exposed: Yes Fascia: No Fascia: No Tendon: No Tendon: No Muscle: No Muscle: No Joint: No Joint: No Bone: No Bone: No Epithelialization: None None N/A Treatment Notes Electronic Signature(s) Signed: 06/10/2019 3:45:57 PM By: Harold Barban Entered By: Harold Barban on 06/10/2019 12:52:28 [name] ([id number]) -------------------------------------------------------------------------------- Multi-Disciplinary Care Plan Details Patient Name: [name], [name]. Date of Service: 06/10/2019 12:45 PM Medical Record Number: [id number] Patient Account Number: [id number] Date of Birth/Sex: 1946-09-12 (73 y.o. F) Treating RN: Harold Barban Primary Care Izael Bessinger: [name] Other Clinician: Referring Luigi Stuckey:  [name] Treating Marnell Mcdaniel/Extender: Beverly Gust in Treatment: 26 Active Inactive Orientation to the Wound Care Program Nursing Diagnoses: Knowledge deficit related to the wound healing center program Goals: Patient/caregiver will verbalize understanding of the [facility] Program Date Initiated: 12/10/2018 Target Resolution Date: 01/09/2019 Goal Status: Active Interventions: Provide education on orientation to the wound center Notes: Soft Tissue Infection Nursing Diagnoses: Impaired tissue integrity Goals: Patient's soft tissue infection will resolve Date Initiated: 12/10/2018 Target Resolution Date: 01/09/2019 Goal Status: Active Interventions: Assess signs and symptoms of infection every visit Notes: Venous Leg Ulcer Nursing Diagnoses: Actual venous Insuffiency (use after diagnosis is confirmed) Goals: Patient will maintain optimal edema control Date Initiated: 12/10/2018 Target Resolution Date: 01/09/2019 Goal Status: Active Interventions: Assess peripheral edema status every visit. [name], [name] ([id number]) Treatment Activities: Therapeutic compression applied : 12/10/2018 Notes: Wound/Skin Impairment Nursing Diagnoses: Impaired tissue integrity Goals: Patient/caregiver will verbalize understanding of skin care regimen Date Initiated: 12/10/2018 Target Resolution Date: 01/09/2019 Goal Status: Active Interventions: Assess ulceration(s) every visit Treatment Activities: Topical wound management initiated : 12/10/2018 Notes: Electronic Signature(s) Signed: 06/10/2019 3:45:57 PM By: Harold Barban Entered By: Harold Barban on 06/10/2019 12:52:14 Ojeda, Tenna [name] ([id number]) -------------------------------------------------------------------------------- Pain Assessment Details Patient Name: [name], [name]. Date of Service: 06/10/2019 12:45 PM Medical Record Number: [id number] Patient Account Number: [id number] Date of Birth/Sex: 1946/09/26 (73  y.o. F) Treating RN: Army Melia Primary Care Ridhaan Dreibelbis: [name] Other Clinician: Referring Lavonn Maxcy: [name] Treating Catalea Labrecque/Extender: Beverly Gust in Treatment: 26 Active Problems Location of Pain Severity and Description of Pain Patient Has Paino No Site Locations Pain Management and Medication Current Pain Management: Electronic Signature(s) Signed: 06/10/2019 4:04:06 PM By: Army Melia Entered By: Army Melia on 06/10/2019 12:35:21 Steinhoff, Tenna [name] ([id number]) -------------------------------------------------------------------------------- Patient/Caregiver Education Details Patient Name: [name], [name]. Date of Service: 06/10/2019 12:45 PM Medical Record Number: [id number] Patient Account Number: [id number] Date of Birth/Gender: June 27, 1946 (73 y.o. F) Treating RN: Harold Barban Primary Care Physician: [name] Other Clinician: Referring Physician: [name] Treating Physician/Extender: Beverly Gust in Treatment: 40 Education Assessment Education Provided To: Patient Education Topics Provided Wound/Skin Impairment: Handouts: Caring for Your Ulcer Methods: Demonstration, Explain/Verbal Responses: State content correctly Electronic Signature(s) Signed: 06/10/2019 3:45:57 PM By: Harold Barban Entered By: Harold Barban on 06/10/2019 12:52:52 Gubser, Tenna [name] ([id number]) -------------------------------------------------------------------------------- Wound Assessment Details Patient Name: [name], [name]. Date of Service: 06/10/2019 12:45 PM Medical Record Number: [id number] Patient Account Number: [id number] Date of Birth/Sex: [birth year] (73 y.o. F) Treating RN: Army Melia Primary Care Jossiah Smoak: [name] Other Clinician: Referring Wyoma Genson: [name] Treating Henretter [name]/Extender: Tobi Bastos  Weeks in Treatment: 26 Wound Status Wound Number: 5 Primary Lymphedema Etiology: Wound Location: Left  Lower Leg - Medial Wound Open Wounding Event: Gradually Appeared Status: Date Acquired: 11/19/2018 Comorbid Cataracts, Asthma, Sleep Apnea, Deep Vein Weeks Of Treatment: 26 History: Thrombosis, Hypertension, Peripheral Venous Clustered Wound: No Disease, Osteoarthritis, Received Chemotherapy, Received Radiation Photos Wound Measurements Length: (cm) 4.2 Width: (cm) 4.5 Depth: (cm) 0.2 Area: (cm) 14.844 Volume: (cm) 2.969 % Reduction in Area: -73.7% % Reduction in Volume: -247.3% Epithelialization: None Tunneling: No Undermining: No Wound Description Full Thickness Without Exposed Support Foul Odo Classification: Structures Slough/F Wound Margin: Indistinct, nonvisible Exudate Large Amount: Exudate Type: Purulent Exudate Color: yellow, brown, green r After Cleansing: No ibrino Yes Wound Bed Granulation Amount: Medium (34-66%) Exposed Structure Granulation Quality: Pink Fascia Exposed: No Necrotic Amount: Medium (34-66%) Fat Layer (Subcutaneous Tissue) Exposed: Yes Necrotic Quality: Adherent Slough Tendon Exposed: No Muscle Exposed: No Joint Exposed: No Bone Exposed: No [place], Esmae J. ([id number]) Treatment Notes Wound #5 (Left, Medial Lower Leg) Notes sorbact, xtra sorb, zinc periwound, 3 payer Electronic Signature(s) Signed: 06/10/2019 4:04:06 PM By: Army Melia Entered By: Army Melia on 06/10/2019 12:43:48 Kierstead, Tenna [name] ([id number]) -------------------------------------------------------------------------------- Wound Assessment Details Patient Name: [name], [name]. Date of Service: 06/10/2019 12:45 PM Medical Record Number: [id number] Patient Account Number: [id number] Date of Birth/Sex: 09/24/1946 (73 y.o. F) Treating RN: Army Melia Primary Care Jordon Kristiansen: [name] Other Clinician: Referring Ferrell Claiborne: [name] Treating Braxtyn Dorff/Extender: Beverly Gust in Treatment: 26 Wound Status Wound Number: 6 Primary Venous Leg  Ulcer Etiology: Wound Location: Left Lower Leg - Lateral Wound Open Wounding Event: Gradually Appeared Status: Date Acquired: 01/19/2019 Comorbid Cataracts, Asthma, Sleep Apnea, Deep Vein Weeks Of Treatment: 20 History: Thrombosis, Hypertension, Peripheral Venous Clustered Wound: No Disease, Osteoarthritis, Received Chemotherapy, Received Radiation Photos Wound Measurements Length: (cm) 2 Width: (cm) 2.3 Depth: (cm) 0.2 Area: (cm) 3.613 Volume: (cm) 0.723 % Reduction in Area: -1542.3% % Reduction in Volume: -3186.4% Epithelialization: None Tunneling: No Undermining: No Wound Description Full Thickness Without Exposed Support Foul Odo Classification: Structures Slough/F Wound Margin: Indistinct, nonvisible Exudate Large Amount: Exudate Type: Purulent Exudate Color: yellow, brown, green r After Cleansing: No ibrino Yes Wound Bed Granulation Amount: Medium (34-66%) Exposed Structure Granulation Quality: Pink Fascia Exposed: No Necrotic Amount: Medium (34-66%) Fat Layer (Subcutaneous Tissue) Exposed: Yes Necrotic Quality: Adherent Slough Tendon Exposed: No Muscle Exposed: No Joint Exposed: No Bone Exposed: No Bixler, [name] J. ([id number]) Treatment Notes Wound #6 (Left, Lateral Lower Leg) Notes sorbact, xtra sorb, zinc periwound, 3 payer Electronic Signature(s) Signed: 06/10/2019 4:04:06 PM By: Army Melia Entered By: Army Melia on 06/10/2019 12:44:20 Seifert, Tenna [name] ([id number]) -------------------------------------------------------------------------------- Vitals Details Patient Name: [name], LAGAN. Date of Service: 06/10/2019 12:45 PM Medical Record Number: [id number] Patient Account Number: [id number] Date of Birth/Sex: 21-Mar-1946 (73 y.o. F) Treating RN: Army Melia Primary Care Nissa Stannard: [name] Other Clinician: Referring Laniqua Torrens: [name] Treating Napolean Sia/Extender: Beverly Gust in Treatment: 26 Vital Signs Time  Taken: 12:35 Temperature (F): 99.2 Height (in): 63 Pulse (bpm): 86 Weight (lbs): 224.7 Respiratory Rate (breaths/min): 16 Body Mass Index (BMI): 39.8 Blood Pressure (mmHg): 149/79 Reference Range: 80 - 120 mg / dl Electronic Signature(s) Signed: 06/10/2019 4:04:06 PM By: Army Melia Entered By: Army Melia on 06/10/2019 12:37:01

## 2019-06-10 NOTE — Progress Notes (Addendum)
[name], [name] ([id number]) Visit Report for 06/10/2019 HPI Details Patient Name: [name], [name]. Date of Service: 06/10/2019 12:45 PM Medical Record Number: [id number] Patient Account Number: [id number] Date of Birth/Sex: [birth year] (73 y.o. F) Treating RN: Cornell Barman Primary Care Provider: [name] Other Clinician: Referring Provider: [name] Treating Provider/Extender: Beverly Gust in Treatment: 26 History of Present Illness HPI Description: Pleasant 73 year old with history of chronic venous insufficiency. No diabetes or peripheral vascular disease. Left ABI 1.29. Questionable history of left lower extremity DVT. She developed a recurrent ulceration on her left lateral calf in December 2015, which she attributes to poor diet Amber subsequent lower extremity edema. She underwent endovenous laser ablation of her left greater saphenous vein in 2010. She underwent laser ablation of accessory branch of left GSV in April 2016 by Dr. Kellie Simmering at [facility]. She was previously wearing Unna boots, which she tolerated well. Tolerating 2 layer compression Amber cadexomer iodine. She returns to clinic for follow-up Amber is without new complaints. She denies any significant pain at this time. She reports persistent pain with pressure. No claudication or ischemic rest pain. No fever or chills. No drainage. READMISSION 11/13/16; this is a 73 year old woman who is not a diabetic. She is here for a review of a painful area on her left medial lower extremity. I note that she was seen here previously last year for wound I believe to be in the same area. At that time she had undergone previously a left greater saphenous vein ablation by Dr. Kellie Simmering Amber she had a ablation of the anterior accessory branch of the left greater saphenous vein in March 2016. Seeing that the wound actually closed over. In reviewing the history with her today the ulcer in this area has been recurrent. She describes a  biopsy of this area in 2009 that only showed stasis physiology. 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 Do not see that she has ever had arterial studies in the left leg. She tells me she has a follow-up with Dr. Kellie Simmering in roughly 10 days In any case she developed the reopening of this area roughly a month ago. On the background of this she describes rapidly increasing edema which has responded to Lasix 40 mg Amber metolazone 2.5 mg as well as the patient's lymph massage. She has been told she has both venous insufficiency Amber lymphedema but she cannot tolerate compression stockings 11/28/16; the patient saw Dr. Kellie Simmering recently. Per the patient he did arterial Dopplers in the office that did not show evidence of arterial insufficiency, per the patient he stated "treat this like an ordinary venous ulcer". She also saw her dermatologist Dr. Ronnald Ramp who felt that this was more of a vascular ulcer. In general things are improving although she arrives today with increasing bilateral lower extremity edema with weeping a deeper fluid through the wound on the left medial leg compatible with some degree of lymphedema 12/04/16; the patient's wound is fully epithelialized but I don't think fully healed. We will do another week of depression with Promogran Amber TCA however I suspect we'll be able to discharge her next week. This is a very unusual-looking wound which was initially a figure-of-eight type wound lying on its side surrounded by petechial like hemorrhage. She has  had venous ablation on this side. She apparently does not have an arterial issue per Dr. Kellie Simmering. She saw her dermatologist thought it was "vascular". Patient is definitely going to need ongoing compression Amber I talked about  this with her today she will go to elastic therapy after she leaves here next week 12/11/16; the patient's wound is not completely closed today. She has surrounding scar tissue Amber in further discussion with the patient it would appear that she had ulcers in this area in 2009 for a prolonged period of time ultimately requiring a punch biopsy of this area that only showed venous insufficiency. I did not previously pickup on this part of the history from the patient. 12/18/16; the patient's wound is completely epithelialized. There is no open area here. She has significant bilateral venous insufficiency with secondary lymphedema to a mild-to-moderate degree she does not have compression stockings.. She did not say anything to me when I was in the room, she told our intake nurse that she was still having pain in this area. This isn't [name], [name] ([id number]) unusual recurrent small open area. She is going to go to elastic therapy to obtain compression stockings. 12/25/16; the patient's wound is fully epithelialized. There is no open area here. The patient describes some continued episodic discomfort in this area medial left calf. However everything looks fine Amber healed here. She is been to elastic therapy Amber caught herself 15-20 mmHg stockings, they apparently were having trouble getting 20-30 mm stockings in her size 01/22/17; this is a patient we discharged from the clinic a month ago. She has a recurrent open wound on her medial left calf. She had 15 mm support stockings. I told her I thought she needed 20-30 mm compression stockings. She tells me that she has been ill with hospitalization secondary to asthma Amber is been found to have severe hypokalemia likely secondary to a combination of Lasix Amber metolazone. This morning she noted blistering Amber leaking fluid on the posterior part of her left leg. She called our intake nurse urgently Amber we was saw her this afternoon. She has not had any real  discomfort here. I don't know that she's been wearing any stockings on this leg for at least 2-3 days. ABIs in this clinic were 1.21 on the right Amber 1.3 on the left. She is previously seen vascular surgery who does not think that there is a peripheral arterial issue. 01/30/17; Patient arrives with no open wound on the left leg. She has been to elastic therapy Amber obtained 20-28mmhg below knee stockings Amber she has one on the right leg today. READMISSION 02/19/18; this [place] is a now 73 year old patient we've had in this clinic perhaps 3 times before. I had last looked at her from January 07 December 2016 with an area on the medial left leg. We discharged her on 12/25/16 however she had to be readmitted on 01/22/17 with a recurrence. I have in my notes that we discharged her on 20-30 mm stockings although she tells me she was only wearing support hose because she cannot get stockings on predominantly related to her cervical spine surgery/issues. She has had previous ablations done by [facility] in [place] including a great saphenous vein ablation on the left with an anterior accessory branch ablation I think both of these were in 2016. On one of the previous visit she had a biopsy noted 2009 that was negative. She is not felt to have an arterial issue. She is not a  diabetic. She does have a history of obstructive sleep apnea hypertension asthma as well as chronic venous insufficiency Amber lymphedema. On this occasion she noted 2 dry scaly patch on her left leg. She tried to put lotion on this it didn't really help. There were 2 open areas.the patient has been seeing her primary physician from 02/05/18 through 02/14/18. She had Unna boots applied. The superior wound now on the lateral left leg has closed but she's had one wound that remains open on the lateral left leg. This is not the same spot as we dealt with in 2018. ABIs in this clinic were 1.3 bilaterally 02/26/18; patient has a small wound  on the left lateral calf. Dimensions are down. She has chronic venous insufficiency Amber lymphedema. 03/05/18; small open area on the left lateral calf. Dimensions are down. Tightly adherent necrotic debris over the surface of the wound which was difficult to remove. Also the dressing [over collagen] stuck to the wound surface. This was removed with some difficulty as well. Change the primary dressing to Hydrofera Blue ready 03/12/18; small open area on the left lateral calf. Comes in with tightly adherent surface eschar as well as some adherent Hydrofera Blue. 03/19/18; open area on the left lateral calf. Again adherent surface eschar as well as some adherent Hydrofera Blue nonviable subcutaneous tissue. She complained of pain all week even with the reduction from 4-3 layer compression I put on last week. Also she had an increase in her ankle Amber calf measurements probably related to the same thing. 03/26/18; open area on the left lateral calf. A very small open area remains here. We used silver alginate starting last week as the Hydrofera Blue seem to stick to the wound bed. In using 4-layer compression 04/02/18; the open area in the left lateral calf at some adherent slough which I removed there is no open area here. We are able to transition her into her own compression stocking. Truthfully I think this is probably his support hose. However this does not maintain skin integrity will be limited. She cannot put over the toe compression stockings on because of neck problems hand problems etc. She is allergic to the lining layer of juxta lites. We might be forced to use extremitease stocking should this fail READMIT 11/24/2018 Patient is now a 73 year old woman who is not a diabetic. She has been in this clinic on at least 3 previous occasions largely with recurrent wounds on her left leg secondary to chronic venous insufficiency with secondary lymphedema. Her situation is complicated by inability to get  stockings on Amber an allergy to neoprene which is apparently a component Amber at least juxta lites Amber other stockings. As a result she really has not been wearing any stockings on her legs. She tells [place] that roughly 2 or 3 weeks ago she started noticing a stinging sensation just above her ankle on the left medial aspect. She has been diagnosed with pseudogout Amber she wondered whether this was what she was experiencing. She tried to dress this with something she bought at the store however subsequently it pulled skin off Amber now she has an open wound that is not improving. She has been using Vaseline gauze with a cover bandage. She saw her primary doctor last week who [name], [name] ([id number]) put an Unna boot on her. ABIs in this clinic was 1.03 on the left 2/12; the area is on the left medial ankle. Odd-looking wound with what looks to be surface epithelialization but  a multitude of small petechial openings. This clearly not closed yet. We have been using silver alginate under 3 layer compression with TCA 2/19; the wound area did not look quite as good this week. Necrotic debris over the majority of the wound surface which required debridement. She continues to have a multitude of what looked to be small petechial openings. She reminds [place] that she had a biopsy on this initially during her first outbreak in 2015 in [place] [facility]. She expresses concern about this being a possible melanoma. She apparently had a nodular melanoma up on her shoulder that was treated with excision, lymph node removal Amber ultimately radiation. I assured her that this does not look anything like melanoma. Except for the petechial reaction it does look like a venous insufficiency area Amber she certainly has evidence of this on both sides 2/26; a difficult area on the left medial ankle. The patient clearly has chronic venous hypertension with some degree of lymphedema. The odd thing about the area is the small  petechial hemorrhages. I am not really sure how to explain this. This was present last time Amber this is not a compression injury. We have been using Hydrofera Blue which I changed to last week 3/4; still using Hydrofera Blue. Aggressive debridement today. She does not have known arterial issues. She has seen Dr. Kellie Simmering at [facility] vascular Amber Amber has an ablation on the left. [Anterior accessory branch of the greater saphenous]. From what I remember they did not feel she had an arterial issue. The patient has had this area biopsied in 2009 at [facility] dermatology Amber by her recollection they said this was "stasis". She is also follow-up with dermatology locally who thought that this was more of a vascular issue 3/11; using Hydrofera Blue. Aggressive debridement today. She does not have an arterial issue. We are using 3 layer compression although we may need to go to 4. The patient has been in for multiple changes to her wrap since I last saw her a week ago. She says that the area was leaking. I do not have too much more information on what was found 01/19/19 on evaluation today patient was actually being seen for a nurse visit when unfortunately she had the area on her left lateral lower extremity as well as weeping from the right lower extremity that became apparent. Therefore we did end up actually seeing her for a full visit with myself. She is having some pain at this site as well but fortunately nothing too significant at this point. No fevers, chills, nausea, or vomiting noted at this time. 3/18-Patient is back to the clinic with the left leg venous leg ulcer, the ulcer is larger in size, has a surface that is densely adherent with fibrinous tissue, the Hydrofera Blue was used but is densely adherent Amber there was difficulty in removing it. The right lower extremity was also wrapped for weeping edema. Patient has a new area over the left lateral foot above the malleolus that is small  Amber appears to have no debris with intact surrounding skin. Patient is on increased dose of Lasix also as a means to edema management 3/25; the patient has a nonhealing venous ulcer on the medial left leg Amber last week developed a smaller area on the lateral left calf. We have been using Hydrofera Blue with a contact layer. 4/1; no major change in these wounds areas. Left medial Amber more recently left lateral calf. I tried Iodoflex last week to  aid in debridement she did not tolerate this. She stated her pain was terrible all week. She took the top layer of the 4 layer compression off. 4/8; the patient actually looks somewhat better in terms of her more prominent left lateral calf wound. There is some healthy looking tissue here. She is still complaining of a lot of discomfort. 4/15; patient in a lot of pain secondary to sciatica. She is on a prednisone taper prescribed by her primary physician. She has the 2 areas one on the left medial Amber more recently a smaller area on the left lateral calf. Both of these just above the malleoli 4/22; her back pain is better but she still states she is very uncomfortable Amber now feels she is intolerant to the [employer]. No real change in the wounds we have been using Sorbact. She has been previously intolerant to Iodoflex. There is not a lot of option about what we can use to debride this wound under compression that she no doubt needs. sHe states Ultram no longer works for her pain 4/29; no major change in the wounds slightly increased depth. Surface on the original medial wound perhaps somewhat improved however the more recent area on the lateral left ankle is 100% covered in very adherent debris we have been using Sorbact. She tolerates 4 layer compression well Amber her edema control is a lot better. She has not had to come in for a nurse check 5/6; no major change in the condition of the wounds. She did consent to debridement today which was done with  some difficulty. Continuing Sorbact. She did not tolerate Iodoflex. She was in for a check of her compression the day after we wrapped her last week this was adjusted but nothing much was found 5/13; no major change in the condition or area of the wounds. I was able to get a fairly aggressive debridement done on the lateral left leg wound. Even using Sorbact under compression. She came back in on Friday to have the wrap changed. She says she felt uncomfortable on the lateral aspect of her ankle. She has a long history of chronic venous insufficiency including previous ablation surgery on this side. 5/20-Patient returns for wounds on left leg with both wounds covered in slough, with the lateral leg wound larger in size, she has been in 3 layer compression Amber felt more comfortable, she describes pain in ankle, in leg Amber pins Amber needles in foot, Christo, Mattea J. ([id number]) Amber is about to try Pamelor for this 6/3; wounds on the left lateral Amber left medial leg. The area medially which is the most recent of the 2 seems to have had the largest increase in dimensions. We have been using Sorbac to try Amber debride the surface. She has been to see orthopedics they apparently did a plain x-ray that was indeterminant. Diagnosed her with neuropathy Amber they have ordered an MRI to determine if there is underlying osteomyelitis. This was not high on my thought list but I suppose it is prudent. We have advised her to make an appointment with [facility] vascular in [place]. She has a history of a left greater saphenous Amber accessory vein ablations I wonder if there is anything else that can be done from a surgical point of view to help in these difficult refractory wounds. We have previously healed this wound on one occasion but it keeps on reopening [medial side] 6/10; deep tissue culture I did last week I think on the left  medial wound showed both moderate E. coli Amber moderate staph aureus [MSSA]. She is  going to require antibiotics Amber I have chosen Augmentin. We have been using Sorbact Amber we have made better looking wound surface on both sides but certainly no improvement in wound area. She was back in last Friday apparently for a dressing changes the wrap was hurting her outer left ankle. She has not managed to get a hold of [facility] vascular in [place]. We are going to have to make her that appointment 6/17; patient is tolerating the Augmentin. She had an MRI that I think was ordered by orthopedic surgeon this did not show osteomyelitis or an abscess did suggest cellulitis. We have been using Sorbact to the lateral Amber medial ankles. We have been trying to arrange a follow-up appointment with [facility] in [place] or did her original ablations. We apparently an area sent the request to [facility] in [facility] 6/24; patient has completed the Augmentin. We do not yet have a [facility] vascular appointment in [place]. I am not sure what the issue is here we have asked her to call tomorrow. We are using Sorbact. Making some improvements Amber especially the medial wound. Both surfaces however look better medial Amber lateral. 7/1; the patient has been in contact with [facility] in [place] but has not yet received an appointment. Using Sorbact we have gradually improve the wound surface with no improvement in surface area. She is approved for Apligraf but the wound surface still is not completely viable. She has not had to come in for a dressing change 7/8; the patient has an appointment with [facility] on 7/31 which is a Friday afternoon. She is concerned about getting back here for [place] to dress her wounds. I think it is important to have them goal for her venous reflux/history of ablations etc. to see if anything else can be done. She apparently tested positive for 1 of the blood tests with regards to lupus Amber saw a rheumatologist. He has raised the issue of  vasculitis again. I have had this thought in the past however the evidence seems overwhelming that this is a venous reflux etiology. If the rheumatologist tells me there is clinical Amber laboratory investigation is positive for lupus I will rethink this. 7/15; the patient's wound surfaces are quite a bit better. The medial area which was her original wound now has no depth although the lateral wound which was the more recent area actually appears larger. Both with viable surfaces which is indeed better. Using Sorbact. I wanted to use Apligraf on her however there is the issue of the [facility] appointment on 7/31 at 2:00 in the afternoon which would not allow her to get back to be rewrapped Amber they would no doubt remove the graft 7/22; the patient's wound surfaces have moderate amount of debris although generally look better. The lateral one is larger with 2 small satellite areas superiorly. We are waiting for her [facility] appointment on 7/31. She has been approved for Apligraf which I would like to use after th 7/29; wound surfaces have improved no debridement is required we have been using Sorbact. She sees [facility] vascular on Friday with this so question of whether anything can be done to lessen the likelihood of recurrence Amber/or speed the healing of these areas. She is already had previous ablations. She no doubt has severe venous hypertension 8/5-Patient returns at 1 week, she was  in [place] for 3 days by her podiatrist, we have been using so backed to the wound, she has increased pain in both the wounds on the left lower leg especially the more distal one on the lateral aspect Electronic Signature(s) Signed: 06/10/2019 12:56:37 PM By: Tobi Bastos Entered By: Tobi Bastos on 06/10/2019 12:56:37 Males, [name] ([id number]) -------------------------------------------------------------------------------- Physical Exam Details Patient Name: ANGELLEE, COHILL. Date of  Service: 06/10/2019 12:45 PM Medical Record Number: [id number] Patient Account Number: [id number] Date of Birth/Sex: 1945-11-20 (73 y.o. F) Treating RN: Cornell Barman Primary Care Provider: [name] Other Clinician: Referring Provider: [name] Treating Provider/Extender: Beverly Gust in Treatment: 26 Constitutional alert Amber oriented x 3. sitting or standing blood pressure is within target range for patient.. supine blood pressure is within target range for patient.. pulse regular Amber within target range for patient.Marland Kitchen respirations regular, non-labored Amber within target range for patient.Marland Kitchen temperature within target range for patient.. . . [name] Amber well-hydrated in no acute distress. Notes Both lower extremity wounds on the left the left lateral Amber more the left medial appear to be the same if not slightly worse, there is also nonviable tissue adherent at the base, considering the degree of pain that she is having Amber tenderness even to touch at the edges I have deferred any debridement today There is also some periwound redness which apparently was there before Electronic Signature(s) Signed: 06/10/2019 12:57:36 PM By: Tobi Bastos Entered By: Tobi Bastos on 06/10/2019 12:57:36 Kowalke, [name] ([id number]) -------------------------------------------------------------------------------- Physician Orders Details Patient Name: RANIYAH, CURENTON. Date of Service: 06/10/2019 12:45 PM Medical Record Number: [id number] Patient Account Number: [id number] Date of Birth/Sex: 06/21/1946 (73 y.o. F) Treating RN: Harold Barban Primary Care Provider: [name] Other Clinician: Referring Provider: [name] Treating Provider/Extender: Beverly Gust in Treatment: 79 Verbal / Phone Orders: No Diagnosis Coding Wound Cleansing Wound #5 Left,Medial Lower Leg o Clean wound with Normal Saline. o May Shower, gently pat wound dry prior to  applying new dressing. Wound #6 Left,Lateral Lower Leg o Clean wound with Normal Saline. o May Shower, gently pat wound dry prior to applying new dressing. Anesthetic (add to Medication List) Wound #5 Left,Medial Lower Leg o Topical Lidocaine 4% cream applied to wound bed prior to debridement (In Clinic Only). o Benzocaine Topical Anesthetic Spray applied to wound bed prior to debridement (In Clinic Only). Wound #6 Left,Lateral Lower Leg o Topical Lidocaine 4% cream applied to wound bed prior to debridement (In Clinic Only). o Benzocaine Topical Anesthetic Spray applied to wound bed prior to debridement (In Clinic Only). Skin Barriers/Peri-Wound Care Wound #5 Left,Medial Lower Leg o Barrier cream - Zinc oxide o Other: - Antibiotic ointment on irritated areas Wound #6 Left,Lateral Lower Leg o Other: - Antibiotic ointment on irritated areas Primary Wound Dressing Wound #5 Left,Medial Lower Leg o Other: - Sorbact Wound #6 Left,Lateral Lower Leg o Other: - Sorbact Secondary Dressing Wound #5 Left,Medial Lower Leg o XtraSorb Wound #6 Left,Lateral Lower Leg o XtraSorb Dressing Change Frequency Wound #5 Left,Medial Lower Leg o Change dressing every week [name], [name] ([id number]) Wound #6 Left,Lateral Lower Leg o Change dressing every week Follow-up Appointments Wound #5 Left,Medial Lower Leg o Return Appointment in 1 week. o Nurse Visit as needed - Return Monday, August 3rd for a re-wrap Wound #6 Left,Lateral Lower Leg o Return Appointment in 1 week. o Nurse Visit as needed Edema Control Wound #5 Left,Medial Lower Leg o 3 Layer Compression System - Left Lower  Extremity Wound #6 Left,Lateral Lower Leg o 3 Layer Compression System - Left Lower Extremity Off-Loading Wound #5 Left,Medial Lower Leg o Other: - Elevate legs as needed Wound #6 Left,Lateral Lower Leg o Other: - Elevate legs as needed Electronic Signature(s) Signed:  06/10/2019 3:45:57 PM By: Harold Barban Signed: 06/10/2019 4:03:26 PM By: Tobi Bastos Entered By: Harold Barban on 06/10/2019 12:56:51 Wubben, [name] ([id number]) -------------------------------------------------------------------------------- Progress Note Details Patient Name: SHADAVIA, DAMPIER. Date of Service: 06/10/2019 12:45 PM Medical Record Number: [id number] Patient Account Number: [id number] Date of Birth/Sex: 06-08-1946 (73 y.o. F) Treating RN: Cornell Barman Primary Care Provider: [name] Other Clinician: Referring Provider: [name] Treating Provider/Extender: Beverly Gust in Treatment: 26 Subjective History of Present Illness (HPI) Pleasant 73 year old with history of chronic venous insufficiency. No diabetes or peripheral vascular disease. Left ABI 1.29. Questionable history of left lower extremity DVT. She developed a recurrent ulceration on her left lateral calf in December 2015, which she attributes to poor diet Amber subsequent lower extremity edema. She underwent endovenous laser ablation of her left greater saphenous vein in 2010. She underwent laser ablation of accessory branch of left GSV in April 2016 by Dr. Kellie Simmering at [facility]. She was previously wearing Unna boots, which she tolerated well. Tolerating 2 layer compression Amber cadexomer iodine. She returns to clinic for follow-up Amber is without new complaints. She denies any significant pain at this time. She reports persistent pain with pressure. No claudication or ischemic rest pain. No fever or chills. No drainage. READMISSION 11/13/16; this is a 73 year old woman who is not a diabetic. She is here for a review of a painful area on her left medial lower extremity. I note that she was seen here previously last year for wound I believe to be in the same area. At that time she had undergone previously a left greater saphenous vein ablation by Dr. Kellie Simmering Amber she had a ablation of the anterior  accessory branch of the left greater saphenous vein in March 2016. Seeing that the wound actually closed over. In reviewing the history with her today the ulcer in this area has been recurrent. She describes a biopsy of this area in 2009 that only showed stasis physiology. 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 Do not see that she has ever had arterial studies in the left leg. She tells me she has a follow-up with Dr. Kellie Simmering in roughly 10 days In any case she developed the reopening of this area roughly a month ago. On the background of this she describes rapidly increasing edema which has responded to Lasix 40 mg Amber metolazone 2.5 mg as well as the patient's lymph massage. She has been told she has both venous insufficiency Amber lymphedema but she cannot tolerate compression stockings 11/28/16; the patient saw Dr. Kellie Simmering recently. Per the patient he did arterial Dopplers in the office that did not show evidence of arterial insufficiency, per the patient he stated "treat this like an ordinary venous ulcer". She also saw her dermatologist Dr. Ronnald Ramp who felt that this was more of a vascular ulcer. In general things are improving although she arrives today with increasing bilateral lower extremity edema with weeping a deeper fluid through the wound on the left medial leg compatible with some  degree of lymphedema 12/04/16; the patient's wound is fully epithelialized but I don't think fully healed. We will do another week of depression with Promogran Amber TCA however I suspect we'll be able to discharge her next week. This is a very unusual-looking wound which was initially a figure-of-eight type wound lying on its side surrounded by petechial like hemorrhage. She has had  venous ablation on this side. She apparently does not have an arterial issue per Dr. Kellie Simmering. She saw her dermatologist thought it was "vascular". Patient is definitely going to need ongoing compression Amber I talked about this with her today she will go to elastic therapy after she leaves here next week 12/11/16; the patient's wound is not completely closed today. She has surrounding scar tissue Amber in further discussion with the patient it would appear that she had ulcers in this area in 2009 for a prolonged period of time ultimately requiring a punch biopsy of this area that only showed venous insufficiency. I did not previously pickup on this part of the history from the patient. 12/18/16; the patient's wound is completely epithelialized. There is no open area here. She has significant bilateral venous insufficiency with secondary lymphedema to a mild-to-moderate degree she does not have compression stockings.. She did not say anything to me when I was in the room, she told our intake nurse that she was still having pain in this area. This isn't unusual recurrent small open area. She is going to go to elastic therapy to obtain compression stockings. 12/25/16; the patient's wound is fully epithelialized. There is no open area here. The patient describes some continued episodic discomfort in this area medial left calf. However everything looks fine Amber healed here. She is been to elastic therapy Amber [name], [name] ([id number]) caught herself 15-20 mmHg stockings, they apparently were having trouble getting 20-30 mm stockings in her size 01/22/17; this is a patient we discharged from the clinic a month ago. She has a recurrent open wound on her medial left calf. She had 15 mm support stockings. I told her I thought she needed 20-30 mm compression stockings. She tells me that she has been ill with hospitalization secondary to asthma Amber is been found to have severe hypokalemia likely secondary to  a combination of Lasix Amber metolazone. This morning she noted blistering Amber leaking fluid on the posterior part of her left leg. She called our intake nurse urgently Amber we was saw her this afternoon. She has not had any real discomfort here. I don't know that she's been wearing any stockings on this leg for at least 2-3 days. ABIs in this clinic were 1.21 on the right Amber 1.3 on the left. She is previously seen vascular surgery who does not think that there is a peripheral arterial issue. 01/30/17; Patient arrives with no open wound on the left leg. She has been to elastic therapy Amber obtained 20-70mmhg below knee stockings Amber she has one on the right leg today. READMISSION 02/19/18; this Hamidi is a now 73 year old patient we've had in this clinic perhaps 3 times before. I had last looked at her from January 07 December 2016 with an area on the medial left leg. We discharged her on 12/25/16 however she had to be readmitted on 01/22/17 with a recurrence. I have in my notes that we discharged her on 20-30 mm stockings although she tells me she was only wearing support hose because she cannot get stockings on predominantly related to her cervical spine surgery/issues. She  has had previous ablations done by [facility] in [place] including a great saphenous vein ablation on the left with an anterior accessory branch ablation I think both of these were in 2016. On one of the previous visit she had a biopsy noted 2009 that was negative. She is not felt to have an arterial issue. She is not a diabetic. She does have a history of obstructive sleep apnea hypertension asthma as well as chronic venous insufficiency Amber lymphedema. On this occasion she noted 2 dry scaly patch on her left leg. She tried to put lotion on this it didn't really help. There were 2 open areas.the patient has been seeing her primary physician from 02/05/18 through 02/14/18. She had Unna boots applied. The superior wound now on  the lateral left leg has closed but she's had one wound that remains open on the lateral left leg. This is not the same spot as we dealt with in 2018. ABIs in this clinic were 1.3 bilaterally 02/26/18; patient has a small wound on the left lateral calf. Dimensions are down. She has chronic venous insufficiency Amber lymphedema. 03/05/18; small open area on the left lateral calf. Dimensions are down. Tightly adherent necrotic debris over the surface of the wound which was difficult to remove. Also the dressing [over collagen] stuck to the wound surface. This was removed with some difficulty as well. Change the primary dressing to Hydrofera Blue ready 03/12/18; small open area on the left lateral calf. Comes in with tightly adherent surface eschar as well as some adherent Hydrofera Blue. 03/19/18; open area on the left lateral calf. Again adherent surface eschar as well as some adherent Hydrofera Blue nonviable subcutaneous tissue. She complained of pain all week even with the reduction from 4-3 layer compression I put on last week. Also she had an increase in her ankle Amber calf measurements probably related to the same thing. 03/26/18; open area on the left lateral calf. A very small open area remains here. We used silver alginate starting last week as the Hydrofera Blue seem to stick to the wound bed. In using 4-layer compression 04/02/18; the open area in the left lateral calf at some adherent slough which I removed there is no open area here. We are able to transition her into her own compression stocking. Truthfully I think this is probably his support hose. However this does not maintain skin integrity will be limited. She cannot put over the toe compression stockings on because of neck problems hand problems etc. She is allergic to the lining layer of juxta lites. We might be forced to use extremitease stocking should this fail READMIT 11/24/2018 Patient is now a 73 year old woman who is not a  diabetic. She has been in this clinic on at least 3 previous occasions largely with recurrent wounds on her left leg secondary to chronic venous insufficiency with secondary lymphedema. Her situation is complicated by inability to get stockings on Amber an allergy to neoprene which is apparently a component Amber at least juxta lites Amber other stockings. As a result she really has not been wearing any stockings on her legs. She tells [place] that roughly 2 or 3 weeks ago she started noticing a stinging sensation just above her ankle on the left medial aspect. She has been diagnosed with pseudogout Amber she wondered whether this was what she was experiencing. She tried to dress this with something she bought at the store however subsequently it pulled skin off Amber now she has an  open wound that is not improving. She has been using Vaseline gauze with a cover bandage. She saw her primary doctor last week who put an Haematologist on her. ABIs in this clinic was 1.03 on the left JAELY, SILMAN. ([id number]) 2/12; the area is on the left medial ankle. Odd-looking wound with what looks to be surface epithelialization but a multitude of small petechial openings. This clearly not closed yet. We have been using silver alginate under 3 layer compression with TCA 2/19; the wound area did not look quite as good this week. Necrotic debris over the majority of the wound surface which required debridement. She continues to have a multitude of what looked to be small petechial openings. She reminds [place] that she had a biopsy on this initially during her first outbreak in 2015 in [place] [facility]. She expresses concern about this being a possible melanoma. She apparently had a nodular melanoma up on her shoulder that was treated with excision, lymph node removal Amber ultimately radiation. I assured her that this does not look anything like melanoma. Except for the petechial reaction it does look like a venous insufficiency area  Amber she certainly has evidence of this on both sides 2/26; a difficult area on the left medial ankle. The patient clearly has chronic venous hypertension with some degree of lymphedema. The odd thing about the area is the small petechial hemorrhages. I am not really sure how to explain this. This was present last time Amber this is not a compression injury. We have been using Hydrofera Blue which I changed to last week 3/4; still using Hydrofera Blue. Aggressive debridement today. She does not have known arterial issues. She has seen Dr. Kellie Simmering at [facility] Amber has an ablation on the left. [Anterior accessory branch of the greater saphenous]. From what I remember they did not feel she had an arterial issue. The patient has had this area biopsied in 2009 at [facility] dermatology Amber by her recollection they said this was "stasis". She is also follow-up with dermatology locally who thought that this was more of a vascular issue 3/11; using Hydrofera Blue. Aggressive debridement today. She does not have an arterial issue. We are using 3 layer compression although we may need to go to 4. The patient has been in for multiple changes to her wrap since I last saw her a week ago. She says that the area was leaking. I do not have too much more information on what was found 01/19/19 on evaluation today patient was actually being seen for a nurse visit when unfortunately she had the area on her left lateral lower extremity as well as weeping from the right lower extremity that became apparent. Therefore we did end up actually seeing her for a full visit with myself. She is having some pain at this site as well but fortunately nothing too significant at this point. No fevers, chills, nausea, or vomiting noted at this time. 3/18-Patient is back to the clinic with the left leg venous leg ulcer, the ulcer is larger in size, has a surface that is densely adherent with fibrinous tissue, the  Hydrofera Blue was used but is densely adherent Amber there was difficulty in removing it. The right lower extremity was also wrapped for weeping edema. Patient has a new area over the left lateral foot above the malleolus that is small Amber appears to have no debris with intact surrounding skin. Patient is on increased dose of Lasix also as  a means to edema management 3/25; the patient has a nonhealing venous ulcer on the medial left leg Amber last week developed a smaller area on the lateral left calf. We have been using Hydrofera Blue with a contact layer. 4/1; no major change in these wounds areas. Left medial Amber more recently left lateral calf. I tried Iodoflex last week to aid in debridement she did not tolerate this. She stated her pain was terrible all week. She took the top layer of the 4 layer compression off. 4/8; the patient actually looks somewhat better in terms of her more prominent left lateral calf wound. There is some healthy looking tissue here. She is still complaining of a lot of discomfort. 4/15; patient in a lot of pain secondary to sciatica. She is on a prednisone taper prescribed by her primary physician. She has the 2 areas one on the left medial Amber more recently a smaller area on the left lateral calf. Both of these just above the malleoli 4/22; her back pain is better but she still states she is very uncomfortable Amber now feels she is intolerant to the [employer]. No real change in the wounds we have been using Sorbact. She has been previously intolerant to Iodoflex. There is not a lot of option about what we can use to debride this wound under compression that she no doubt needs. sHe states Ultram no longer works for her pain 4/29; no major change in the wounds slightly increased depth. Surface on the original medial wound perhaps somewhat improved however the more recent area on the lateral left ankle is 100% covered in very adherent debris we have been using Sorbact.  She tolerates 4 layer compression well Amber her edema control is a lot better. She has not had to come in for a nurse check 5/6; no major change in the condition of the wounds. She did consent to debridement today which was done with some difficulty. Continuing Sorbact. She did not tolerate Iodoflex. She was in for a check of her compression the day after we wrapped her last week this was adjusted but nothing much was found 5/13; no major change in the condition or area of the wounds. I was able to get a fairly aggressive debridement done on the lateral left leg wound. Even using Sorbact under compression. She came back in on Friday to have the wrap changed. She says she felt uncomfortable on the lateral aspect of her ankle. She has a long history of chronic venous insufficiency including previous ablation surgery on this side. 5/20-Patient returns for wounds on left leg with both wounds covered in slough, with the lateral leg wound larger in size, she has been in 3 layer compression Amber felt more comfortable, she describes pain in ankle, in leg Amber pins Amber needles in foot, Amber is about to try Pamelor for this 6/3; wounds on the left lateral Amber left medial leg. The area medially which is the most recent of the 2 seems to have had the largest increase in dimensions. We have been using Sorbac to try Amber debride the surface. She has been to see orthopedics [name], [name] ([id number]) they apparently did a plain x-ray that was indeterminant. Diagnosed her with neuropathy Amber they have ordered an MRI to determine if there is underlying osteomyelitis. This was not high on my thought list but I suppose it is prudent. We have advised her to make an appointment with [facility] in [place]. She has a history  of a left greater saphenous Amber accessory vein ablations I wonder if there is anything else that can be done from a surgical point of view to help in these difficult refractory wounds. We  have previously healed this wound on one occasion but it keeps on reopening [medial side] 6/10; deep tissue culture I did last week I think on the left medial wound showed both moderate E. coli Amber moderate staph aureus [MSSA]. She is going to require antibiotics Amber I have chosen Augmentin. We have been using Sorbact Amber we have made better looking wound surface on both sides but certainly no improvement in wound area. She was back in last Friday apparently for a dressing changes the wrap was hurting her outer left ankle. She has not managed to get a hold of [facility] in [place]. We are going to have to make her that appointment 6/17; patient is tolerating the Augmentin. She had an MRI that I think was ordered by orthopedic surgeon this did not show osteomyelitis or an abscess did suggest cellulitis. We have been using Sorbact to the lateral Amber medial ankles. We have been trying to arrange a follow-up appointment with [facility] in [place] or did her original ablations. We apparently an area sent the request to [facility] in [facility] 6/24; patient has completed the Augmentin. We do not yet have a [facility] appointment in [place]. I am not sure what the issue is here we have asked her to call tomorrow. We are using Sorbact. Making some improvements Amber especially the medial wound. Both surfaces however look better medial Amber lateral. 7/1; the patient has been in contact with [facility] in [place] but has not yet received an appointment. Using Sorbact we have gradually improve the wound surface with no improvement in surface area. She is approved for Apligraf but the wound surface still is not completely viable. She has not had to come in for a dressing change 7/8; the patient has an appointment with [facility] on 7/31 which is a Friday afternoon. She is concerned about getting back here for [place] to dress her wounds. I think it is important to  have them goal for her venous reflux/history of ablations etc. to see if anything else can be done. She apparently tested positive for 1 of the blood tests with regards to lupus Amber saw a rheumatologist. He has raised the issue of vasculitis again. I have had this thought in the past however the evidence seems overwhelming that this is a venous reflux etiology. If the rheumatologist tells me there is clinical Amber laboratory investigation is positive for lupus I will rethink this. 7/15; the patient's wound surfaces are quite a bit better. The medial area which was her original wound now has no depth although the lateral wound which was the more recent area actually appears larger. Both with viable surfaces which is indeed better. Using Sorbact. I wanted to use Apligraf on her however there is the issue of the [facility] appointment on 7/31 at 2:00 in the afternoon which would not allow her to get back to be rewrapped Amber they would no doubt remove the graft 7/22; the patient's wound surfaces have moderate amount of debris although generally look better. The lateral one is larger with 2 small satellite areas superiorly. We are waiting for her [facility] appointment on 7/31. She has been approved for Apligraf which I would like to use after th 7/29; wound  surfaces have improved no debridement is required we have been using Sorbact. She sees [facility] on Friday with this so question of whether anything can be done to lessen the likelihood of recurrence Amber/or speed the healing of these areas. She is already had previous ablations. She no doubt has severe venous hypertension 8/5-Patient returns at 1 week, she was in [place] for 3 days by her podiatrist, we have been using so backed to the wound, she has increased pain in both the wounds on the left lower leg especially the more distal one on the lateral aspect Objective Constitutional alert Amber oriented x 3. sitting or standing  blood pressure is within target range for patient.. supine blood pressure is within target range for patient.. pulse regular Amber within target range for patient.Marland Kitchen respirations regular, non-labored Amber within target range for patient.Marland Kitchen temperature within target range for patient.. [name] Amber well-hydrated in no acute distress. Vitals Time Taken: 12:35 PM, Height: 63 in, Weight: 224.7 lbs, BMI: 39.8, Temperature: 99.2 F, Pulse: 86 bpm, Respiratory Rate: 16 breaths/min, Blood Pressure: 149/79 mmHg. EMBERLEY, KRAL ([id number]) General Notes: Both lower extremity wounds on the left the left lateral Amber more the left medial appear to be the same if not slightly worse, there is also nonviable tissue adherent at the base, considering the degree of pain that she is having Amber tenderness even to touch at the edges I have deferred any debridement today There is also some periwound redness which apparently was there before Integumentary (Hair, Skin) Wound #5 status is Open. Original cause of wound was Gradually Appeared. The wound is located on the Left,Medial Lower Leg. The wound measures 4.2cm length x 4.5cm width x 0.2cm depth; 14.844cm^2 area Amber 2.969cm^3 volume. There is Fat Layer (Subcutaneous Tissue) Exposed exposed. There is no tunneling or undermining noted. There is a large amount of purulent drainage noted. The wound margin is indistinct Amber nonvisible. There is medium (34-66%) pink granulation within the wound bed. There is a medium (34-66%) amount of necrotic tissue within the wound bed including Adherent Slough. Wound #6 status is Open. Original cause of wound was Gradually Appeared. The wound is located on the Left,Lateral Lower Leg. The wound measures 2cm length x 2.3cm width x 0.2cm depth; 3.613cm^2 area Amber 0.723cm^3 volume. There is Fat Layer (Subcutaneous Tissue) Exposed exposed. There is no tunneling or undermining noted. There is a large amount of purulent drainage noted. The  wound margin is indistinct Amber nonvisible. There is medium (34-66%) pink granulation within the wound bed. There is a medium (34-66%) amount of necrotic tissue within the wound bed including Adherent Slough. Plan Wound Cleansing: Wound #5 Left,Medial Lower Leg: Clean wound with Normal Saline. May Shower, gently pat wound dry prior to applying new dressing. Wound #6 Left,Lateral Lower Leg: Clean wound with Normal Saline. May Shower, gently pat wound dry prior to applying new dressing. Anesthetic (add to Medication List): Wound #5 Left,Medial Lower Leg: Topical Lidocaine 4% cream applied to wound bed prior to debridement (In Clinic Only). Benzocaine Topical Anesthetic Spray applied to wound bed prior to debridement (In Clinic Only). Wound #6 Left,Lateral Lower Leg: Topical Lidocaine 4% cream applied to wound bed prior to debridement (In Clinic Only). Benzocaine Topical Anesthetic Spray applied to wound bed prior to debridement (In Clinic Only). Skin Barriers/Peri-Wound Care: Wound #5 Left,Medial Lower Leg: Barrier cream - Zinc oxide Other: - Antibiotic ointment on irritated areas Wound #6 Left,Lateral Lower Leg: Other: - Antibiotic ointment on irritated areas  Primary Wound Dressing: Wound #5 Left,Medial Lower Leg: Other: - Sorbact Wound #6 Left,Lateral Lower Leg: Other: - Sorbact Secondary Dressing: Wound #5 Left,Medial Lower Leg: XtraSorb Wound #6 Left,Lateral Lower Leg: XtraSorb Dressing Change Frequency: Wound #5 Left,Medial Lower Leg: Change dressing every week SHYKERIA, SAKAMOTO ([id number]) Wound #6 Left,Lateral Lower Leg: Change dressing every week Follow-up Appointments: Wound #5 Left,Medial Lower Leg: Return Appointment in 1 week. Nurse Visit as needed - Return Monday, August 3rd for a re-wrap Wound #6 Left,Lateral Lower Leg: Return Appointment in 1 week. Nurse Visit as needed Edema Control: Wound #5 Left,Medial Lower Leg: 3 Layer Compression System - Left Lower  Extremity Wound #6 Left,Lateral Lower Leg: 3 Layer Compression System - Left Lower Extremity Off-Loading: Wound #5 Left,Medial Lower Leg: Other: - Elevate legs as needed Wound #6 Left,Lateral Lower Leg: Other: - Elevate legs as needed 1. Continue using so backed to the wounds, every week dressing changes 2. Return to clinic next week, looked at the vein studies documenting venous reflux in the left at the [facility], [facility], her podiatrist to talk about a venogram which she would agree to at some point in the future Electronic Signature(s) Signed: 06/10/2019 12:59:10 PM By: Tobi Bastos Entered By: Tobi Bastos on 06/10/2019 12:59:10 Dragos, [name] ([id number]) -------------------------------------------------------------------------------- SuperBill Details Patient Name: KAILEENA, OBI. Date of Service: 06/10/2019 Medical Record Number: [id number] Patient Account Number: [id number] Date of Birth/Sex: 1946/05/11 (73 y.o. F) Treating RN: Cornell Barman Primary Care Provider: [name] Other Clinician: Referring Provider: [name] Treating Provider/Extender: Beverly Gust in Treatment: 26 Diagnosis Coding ICD-10 Codes Code Description [phone] Non-pressure chronic ulcer of left calf limited to breakdown of skin I87.321 Chronic venous hypertension (idiopathic) with inflammation of right lower extremity I89.0 Lymphedema, not elsewhere classified Facility Procedures CPT4 Code: [id number] Description: (Facility Use Only) [phone] - [place] LWR LT LEG Modifier: Quantity: 1 Physician Procedures CPT4 Code: [id number] Description: 27517 - WC PHYS LEVEL 3 - EST PT ICD-10 Diagnosis Description L97.221 Non-pressure chronic ulcer of left calf limited to breakdown Modifier: of skin Quantity: 1 Electronic Signature(s) Signed: 06/10/2019 4:53:21 PM By: [name], [name], RN, CWS, [name], [name] Previous Signature: 06/10/2019 1:04:21 PM Version By: Tobi Bastos Entered By: [name],  [name], RN, CWS, Kim on 06/10/2019 16:53:21

## 2019-06-16 ENCOUNTER — Ambulatory Visit: Payer: Medicare Other | Admitting: Adult Health

## 2019-06-17 ENCOUNTER — Other Ambulatory Visit: Payer: Self-pay

## 2019-06-17 ENCOUNTER — Encounter: Payer: Medicare Other | Admitting: Internal Medicine

## 2019-06-17 DIAGNOSIS — I87312 Chronic venous hypertension (idiopathic) with ulcer of left lower extremity: Secondary | ICD-10-CM | POA: Diagnosis not present

## 2019-06-17 DIAGNOSIS — E876 Hypokalemia: Secondary | ICD-10-CM | POA: Diagnosis not present

## 2019-06-17 DIAGNOSIS — G629 Polyneuropathy, unspecified: Secondary | ICD-10-CM | POA: Diagnosis not present

## 2019-06-17 DIAGNOSIS — L97222 Non-pressure chronic ulcer of left calf with fat layer exposed: Secondary | ICD-10-CM | POA: Diagnosis not present

## 2019-06-17 DIAGNOSIS — L97322 Non-pressure chronic ulcer of left ankle with fat layer exposed: Secondary | ICD-10-CM | POA: Diagnosis not present

## 2019-06-17 DIAGNOSIS — I10 Essential (primary) hypertension: Secondary | ICD-10-CM | POA: Diagnosis not present

## 2019-06-17 DIAGNOSIS — G4733 Obstructive sleep apnea (adult) (pediatric): Secondary | ICD-10-CM | POA: Diagnosis not present

## 2019-06-17 DIAGNOSIS — I890 Lymphedema, not elsewhere classified: Secondary | ICD-10-CM | POA: Diagnosis not present

## 2019-06-17 DIAGNOSIS — L97221 Non-pressure chronic ulcer of left calf limited to breakdown of skin: Secondary | ICD-10-CM | POA: Diagnosis not present

## 2019-06-18 NOTE — Progress Notes (Signed)
Amber, [name] ([id number]) Visit Report for 06/17/2019 Arrival Information Details Patient Name: Amber, [name]. Date of Service: 06/17/2019 12:45 PM Medical Record Number: [id number] Patient Account Number: [id number] Date of Birth/Sex: 1946-09-24 (73 y.o. F) Treating RN: [name] Primary Care [name]: [name] Other Clinician: Referring [name]: [name] Treating [name]/Extender: [name] in Treatment: 27 Visit Information History Since Last Visit Added or deleted any medications: No Patient Arrived: Ambulatory Any new allergies or adverse reactions: No Arrival Time: 12:49 Had a fall or experienced change in No Accompanied By: self activities of daily living that may affect Transfer Assistance: None risk of falls: Patient Identification Verified: Yes Signs or symptoms of abuse/neglect since last visito No Secondary Verification Process Completed: Yes Hospitalized since last visit: No Implantable device outside of the clinic excluding No cellular tissue based products placed in the center since last visit: Has Dressing in Place as Prescribed: Yes Pain Present Now: No Electronic Signature(s) Signed: 06/17/2019 3:56:16 PM By: [name] RCP, RRT, CHT Entered By: [name] on 06/17/2019 12:51:35 [name], Amber Child ([id number]) -------------------------------------------------------------------------------- Encounter Discharge Information Details Patient Name: Amber, [name]. Date of Service: 06/17/2019 12:45 PM Medical Record Number: [id number] Patient Account Number: [id number] Date of Birth/Sex: [birth year] (73 y.o. F) Treating RN: [name] Primary Care [name]: [name] Other Clinician: Referring Maxim Bedel: [name] Treating [name]/Extender: [name] in Treatment: 27 Encounter Discharge Information Items Post Procedure Vitals Discharge Condition: Stable Temperature  (F): 99.0 Ambulatory Status: Ambulatory Pulse (bpm): 96 Discharge Destination: Home Respiratory Rate (breaths/min): 16 Transportation: Private Auto Blood Pressure (mmHg): 149/77 Accompanied By: self Schedule Follow-up Appointment: Yes Clinical Summary of Care: Electronic Signature(s) Signed: 06/17/2019 4:29:12 PM By: [name] Entered By: [name] on 06/17/2019 13:52:22 Piscopo, Amber Child ([id number]) -------------------------------------------------------------------------------- Lower Extremity Assessment Details Patient Name: Amber, [name]. Date of Service: 06/17/2019 12:45 PM Medical Record Number: [id number] Patient Account Number: [id number] Date of Birth/Sex: [birth year] (73 y.o. F) Treating RN: [name] Primary Care [name]: [name] Other Clinician: Referring [name]: [name] Treating [name]/Extender: [name] in Treatment: 27 Edema Assessment Assessed: [Left: No] [Right: No] Edema: [Left: Ye] [Right: s] Calf Left: Right: Point of Measurement: 31 cm From Medial Instep 45.6 cm cm Ankle Left: Right: Point of Measurement: 12 cm From Medial Instep 23.8 cm cm Vascular Assessment Pulses: Dorsalis Pedis Palpable: [Left:Yes] Electronic Signature(s) Signed: 06/17/2019 3:05:31 PM By: [name] Entered By: [name] on 06/17/2019 12:58:16 Wolden, Amber Child ([id number]) -------------------------------------------------------------------------------- Multi Wound Chart Details Patient Name: Amber, [name]. Date of Service: 06/17/2019 12:45 PM Medical Record Number: [id number] Patient Account Number: [id number] Date of Birth/Sex: June 24, 1946 (73 y.o. F) Treating RN: [name] Primary Care Rhegan Trunnell: [name] Other Clinician: Referring [name]: [name] Treating [name]/Extender: [name] in Treatment: 27 Vital Signs Height(in): 63 Pulse(bpm): 96 Weight(lbs): 224.7 Blood Pressure(mmHg):  149/77 Body Mass Index(BMI): 40 Temperature(F): 99.0 Respiratory Rate 16 (breaths/min): Photos: [N/A:N/A] Wound Location: Left Lower Leg - Medial Left Lower Leg - Lateral N/A Wounding Event: Gradually Appeared Gradually Appeared N/A Primary Etiology: Lymphedema Venous Leg Ulcer N/A Comorbid History: Cataracts, Asthma, Sleep Cataracts, Asthma, Sleep N/A Apnea, Deep Vein Apnea, Deep Vein Thrombosis, Hypertension, Thrombosis, Hypertension, Peripheral Venous Disease, Peripheral Venous Disease, Osteoarthritis, Received Osteoarthritis, Received Chemotherapy, Received Chemotherapy, Received Radiation Radiation Date Acquired: 11/19/2018 01/19/2019 N/A Weeks of Treatment: 27 21 N/A Wound Status: Open Open N/A Measurements L x W x D 2.1x2.7x0.2 3.5x3.9x0.2 N/A (cm) Area (cm) : 4.453 10.721 N/A Volume (  cm) : 0.891 2.144 N/A % Reduction in Area: 47.90% -4773.20% N/A % Reduction in Volume: -4.20% -9645.50% N/A Classification: Full Thickness Without Full Thickness Without N/A Exposed Support Structures Exposed Support Structures Exudate Amount: Large Large N/A Exudate Type: Purulent Purulent N/A Exudate Color: yellow, brown, green yellow, brown, green N/A Wound Margin: Indistinct, nonvisible Indistinct, nonvisible N/A Granulation Amount: Medium (34-66%) Medium (34-66%) N/A Granulation Quality: Pink Pink N/A Necrotic Amount: Medium (34-66%) Medium (34-66%) N/A Exposed Structures: N/A [name] ([id number]) Fat Layer (Subcutaneous Fat Layer (Subcutaneous Tissue) Exposed: Yes Tissue) Exposed: Yes Fascia: No Fascia: No Tendon: No Tendon: No Muscle: No Muscle: No Joint: No Joint: No Bone: No Bone: No Epithelialization: None None N/A Treatment Notes Electronic Signature(s) Signed: 06/17/2019 5:00:13 PM By: [name], [name], RN, CWS, [name], [name] Entered By: [name], [name], RN, CWS, [name] on 06/17/2019 13:30:26 Roca, Amber Child  ([id number]) -------------------------------------------------------------------------------- Multi-Disciplinary Care Plan Details Patient Name: [name]. Date of Service: 06/17/2019 12:45 PM Medical Record Number: [id number] Patient Account Number: [id number] Date of Birth/Sex: [birth year] (73 y.o. F) Treating RN: [name] Primary Care Alfonso Carden: [name] Other Clinician: Referring Arslan Kier: [name] Treating Taneia Mealor/Extender: [name] in Treatment: 27 Active Inactive Orientation to the Wound Care Program Nursing Diagnoses: Knowledge deficit related to the wound healing center program Goals: Patient/caregiver will verbalize understanding of the [place] Program Date Initiated: 12/10/2018 Target Resolution Date: 01/09/2019 Goal Status: Active Interventions: Provide education on orientation to the wound center Notes: Soft Tissue Infection Nursing Diagnoses: Impaired tissue integrity Goals: Patient's soft tissue infection will resolve Date Initiated: 12/10/2018 Target Resolution Date: 01/09/2019 Goal Status: Active Interventions: Assess signs and symptoms of infection every visit Notes: Venous Leg Ulcer Nursing Diagnoses: Actual venous Insuffiency (use after diagnosis is confirmed) Goals: Patient will maintain optimal edema control Date Initiated: 12/10/2018 Target Resolution Date: 01/09/2019 Goal Status: Active Interventions: Assess peripheral edema status every visit. Amber, [name] ([id number]) Treatment Activities: Therapeutic compression applied : 12/10/2018 Notes: Wound/Skin Impairment Nursing Diagnoses: Impaired tissue integrity Goals: Patient/caregiver will verbalize understanding of skin care regimen Date Initiated: 12/10/2018 Target Resolution Date: 01/09/2019 Goal Status: Active Interventions: Assess ulceration(s) every visit Treatment Activities: Topical wound management initiated : 12/10/2018 Notes: Electronic  Signature(s) Signed: 06/17/2019 5:00:13 PM By: [name], [name], RN, CWS, [name], [name] Entered By: [name], [name], RN, CWS, [name] on 06/17/2019 13:29:36 Skelton, Amber Child ([id number]) -------------------------------------------------------------------------------- Pain Assessment Details Patient Name: Amber, [name]. Date of Service: 06/17/2019 12:45 PM Medical Record Number: [id number] Patient Account Number: [id number] Date of Birth/Sex: 06-04-1946 (73 y.o. F) Treating RN: [name] Primary Care Sandro Burgo: [name] Other Clinician: Referring Shanay Woolman: [name] Treating Yazid Pop/Extender: [name] in Treatment: 27 Active Problems Location of Pain Severity and Description of Pain Patient Has Paino No Site Locations Pain Management and Medication Current Pain Management: Electronic Signature(s) Signed: 06/17/2019 3:56:16 PM By: Paulla Fore, RRT, CHT Signed: 06/17/2019 5:00:13 PM By: [name], [name], RN, CWS, [name], [name] Entered By: [name] on 06/17/2019 12:51:48 Amber, [name] ([id number]) -------------------------------------------------------------------------------- Patient/Caregiver Education Details Patient Name: Amber, [name]. Date of Service: 06/17/2019 12:45 PM Medical Record Number: [id number] Patient Account Number: [id number] Date of Birth/Gender: [birth year] (73 y.o. F) Treating RN: [name] Primary Care Physician: [name] Other Clinician: Referring Physician: Ria [name] Treating Physician/Extender: [name] in Treatment: 37 Education Assessment Education Provided To: Patient Education Topics Provided Venous: Handouts: Controlling Swelling with Multilayered Compression Wraps Methods: Demonstration, Explain/Verbal Responses: State content correctly Wound/Skin  Impairment: Handouts: Caring for Your Ulcer Methods: Demonstration, Explain/Verbal Responses: State content  correctly Electronic Signature(s) Signed: 06/17/2019 5:00:13 PM By: [name], [name], RN, CWS, [name], [name] Entered By: [name], [name], RN, CWS, [name] on 06/17/2019 13:38:03 Amber, [name] ([id number]) -------------------------------------------------------------------------------- Wound Assessment Details Patient Name: Amber, [name]. Date of Service: 06/17/2019 12:45 PM Medical Record Number: [id number] Patient Account Number: [id number] Date of Birth/Sex: 11-14-1945 (73 y.o. F) Treating RN: [name] Primary Care Kamoni Gentles: [name] Other Clinician: Referring Shakinah Navis: [name] Treating Angeliah Wisdom/Extender: [name] in Treatment: 27 Wound Status Wound Number: 5 Primary Lymphedema Etiology: Wound Location: Left Lower Leg - Medial Wound Open Wounding Event: Gradually Appeared Status: Date Acquired: 11/19/2018 Comorbid Cataracts, Asthma, Sleep Apnea, Deep Vein Weeks Of Treatment: 27 History: Thrombosis, Hypertension, Peripheral Venous Clustered Wound: No Disease, Osteoarthritis, Received Chemotherapy, Received Radiation Photos Wound Measurements Length: (cm) 2.1 Width: (cm) 2.7 Depth: (cm) 0.2 Area: (cm) 4.453 Volume: (cm) 0.891 % Reduction in Area: 47.9% % Reduction in Volume: -4.2% Epithelialization: None Tunneling: No Undermining: No Wound Description Full Thickness Without Exposed Support Classification: Structures Wound Margin: Indistinct, nonvisible Exudate Large Amount: Exudate Type: Purulent Exudate Color: yellow, brown, green Foul Odor After Cleansing: No Slough/Fibrino Yes Wound Bed Granulation Amount: Medium (34-66%) Exposed Structure Granulation Quality: Pink Fascia Exposed: No Necrotic Amount: Medium (34-66%) Fat Layer (Subcutaneous Tissue) Exposed: Yes Necrotic Quality: Adherent Slough Tendon Exposed: No Muscle Exposed: No Joint Exposed: No Bone Exposed: No Mix, Jenie J. ([id number]) Treatment Notes Wound #5 (Left,  Medial Lower Leg) Notes zinc periwound, sorbact, xtrasorb, 3 layer Electronic Signature(s) Signed: 06/17/2019 3:05:31 PM By: [name] Entered By: [name] on 06/17/2019 13:06:27 [name] ([id number]) -------------------------------------------------------------------------------- Wound Assessment Details Patient Name: Amber, [name]. Date of Service: 06/17/2019 12:45 PM Medical Record Number: [id number] Patient Account Number: [id number] Date of Birth/Sex: 09/23/1946 (73 y.o. F) Treating RN: [name] Primary Care Joesph Marcy: [name] Other Clinician: Referring Soua Lenk: [name] Treating Sharalee Witman/Extender: [name] in Treatment: 27 Wound Status Wound Number: 6 Primary Venous Leg Ulcer Etiology: Wound Location: Left Lower Leg - Lateral Wound Open Wounding Event: Gradually Appeared Status: Date Acquired: 01/19/2019 Comorbid Cataracts, Asthma, Sleep Apnea, Deep Vein Weeks Of Treatment: 21 History: Thrombosis, Hypertension, Peripheral Venous Clustered Wound: No Disease, Osteoarthritis, Received Chemotherapy, Received Radiation Photos Wound Measurements Length: (cm) 3.5 Width: (cm) 3.9 Depth: (cm) 0.2 Area: (cm) 10.721 Volume: (cm) 2.144 % Reduction in Area: -4773.2% % Reduction in Volume: -9645.5% Epithelialization: None Tunneling: No Undermining: No Wound Description Full Thickness Without Exposed Support Foul Od Classification: Structures Slough/ Wound Margin: Indistinct, nonvisible Exudate Large Amount: Exudate Type: Purulent Exudate Color: yellow, brown, green or After Cleansing: No Fibrino Yes Wound Bed Granulation Amount: Medium (34-66%) Exposed Structure Granulation Quality: Pink Fascia Exposed: No Necrotic Amount: Medium (34-66%) Fat Layer (Subcutaneous Tissue) Exposed: Yes Necrotic Quality: Adherent Slough Tendon Exposed: No Muscle Exposed: No Joint Exposed: No Bone Exposed: No Ogan, Masa  J. ([id number]) Treatment Notes Wound #6 (Left, Lateral Lower Leg) Notes zinc periwound, sorbact, xtrasorb, 3 layer Electronic Signature(s) Signed: 06/17/2019 3:05:31 PM By: [name] Entered By: [name] on 06/17/2019 13:06:55 Remillard, Amber Child ([id number]) -------------------------------------------------------------------------------- Vitals Details Patient Name: Amber, [name]. Date of Service: 06/17/2019 12:45 PM Medical Record Number: [id number] Patient Account Number: [id number] Date of Birth/Sex: 03-13-1946 (73 y.o. F) Treating RN: [name] Primary Care Janijah Symons: [name] Other Clinician: Referring Alwilda Gilland: [name] Treating Tollie Canada/Extender: [name] in Treatment: 27 Vital Signs Time Taken: 12:50 Temperature (  F): 99.0 Height (in): 63 Pulse (bpm): 96 Weight (lbs): 224.7 Respiratory Rate (breaths/min): 16 Body Mass Index (BMI): 39.8 Blood Pressure (mmHg): 149/77 Reference Range: 80 - 120 mg / dl Electronic Signature(s) Signed: 06/17/2019 3:56:16 PM By: [name] RCP, RRT, CHT Entered By: [name] on 06/17/2019 12:52:27

## 2019-06-18 NOTE — Progress Notes (Signed)
Amber, [name] ([id number]) Visit Report for 06/17/2019 Debridement Details Patient Name: Amber, [name]. Date of Service: 06/17/2019 12:45 PM Medical Record Number: [id number] Patient Account Number: [id number] Date of Birth/Sex: 1945/12/11 (73 y.o. F) Treating RN: [name] Primary Care [name]: [name] Other Clinician: Referring [name]: [name] Treating [name]/Extender: [name] in Treatment: 27 Debridement Performed for Wound #5 Left,Medial Lower Leg Assessment: Performed By: Physician Amber Bastos, MD Debridement Type: Debridement Level of Consciousness (Pre- Awake and Alert procedure): Pre-procedure Verification/Time Yes - 13:30 Out Taken: Start Time: 13:30 Pain Control: Lidocaine Total Area Debrided (L x W): 2.1 (cm) x 2.7 (cm) = 5.67 (cm) Tissue and other material Viable, Non-Viable, Subcutaneous, Biofilm debrided: Level: Skin/Subcutaneous Tissue Debridement Description: Excisional Instrument: Curette Bleeding: Moderate Hemostasis Achieved: Pressure End Time: 13:35 Response to Treatment: Procedure was tolerated well Level of Consciousness Awake and Alert (Post-procedure): Post Debridement Measurements of Total Wound Length: (cm) 2.1 Width: (cm) 2.7 Depth: (cm) 0.3 Volume: (cm) 1.336 Character of Wound/Ulcer Post Debridement: Improved Post Procedure Diagnosis Same as Pre-procedure Electronic Signature(s) Signed: 06/17/2019 4:28:38 PM By: [name] Signed: 06/17/2019 5:00:13 PM By: [name], [name], RN, CWS, [name], [name] Entered By: [name], [name], RN, CWS, [name] on 06/17/2019 13:32:06 Amber, [name] ([id number]) -------------------------------------------------------------------------------- Debridement Details Patient Name: Amber, [name]. Date of Service: 06/17/2019 12:45 PM Medical Record Number: [id number] Patient Account Number: [id number] Date of Birth/Sex: 08/31/1946 (73 y.o. F) Treating RN: [name] Primary  Care [name]: [name] Other Clinician: Referring [name]: [name] Treating [name]/Extender: [name] in Treatment: 27 Debridement Performed for Wound #6 Left,Lateral Lower Leg Assessment: Performed By: Physician Amber Bastos, MD Debridement Type: Debridement Severity of Tissue Pre Fat layer exposed Debridement: Level of Consciousness (Pre- Awake and Alert procedure): Pre-procedure Verification/Time Yes - 13:30 Out Taken: Start Time: 13:30 Pain Control: Lidocaine Total Area Debrided (L x W): 3.5 (cm) x 3.9 (cm) = 13.65 (cm) Tissue and other material Viable, Non-Viable, Subcutaneous, Biofilm debrided: Level: Skin/Subcutaneous Tissue Debridement Description: Excisional Instrument: Curette Bleeding: Moderate Hemostasis Achieved: Pressure End Time: 13:35 Response to Treatment: Procedure was tolerated well Level of Consciousness Awake and Alert (Post-procedure): Post Debridement Measurements of Total Wound Length: (cm) 3.5 Width: (cm) 3.9 Depth: (cm) 0.3 Volume: (cm) 3.216 Character of Wound/Ulcer Post Debridement: Stable Severity of Tissue Post Debridement: Fat layer exposed Post Procedure Diagnosis Same as Pre-procedure Electronic Signature(s) Signed: 06/17/2019 4:28:38 PM By: [name] Signed: 06/17/2019 5:00:13 PM By: [name], [name], RN, CWS, [name], [name] Entered By: [name], [name], RN, CWS, [name] on 06/17/2019 13:33:06 [name], [name] ([id number]) -------------------------------------------------------------------------------- HPI Details Patient Name: Amber, [name]. Date of Service: 06/17/2019 12:45 PM Medical Record Number: [id number] Patient Account Number: [id number] Date of Birth/Sex: 09-15-1946 (73 y.o. F) Treating RN: [name] Primary Care [name]: [name] Other Clinician: Referring [name]: [name] Treating [name]/Extender: [name] in Treatment: 27 History of Present  Illness HPI Description: Pleasant 73 year old with history of chronic venous insufficiency. No diabetes or peripheral vascular disease. Left ABI 1.29. Questionable history of left lower extremity DVT. She developed a recurrent ulceration on her left lateral calf in December 2015, which she attributes to poor diet and subsequent lower extremity edema. She underwent endovenous laser ablation of her left greater saphenous vein in 2010. She underwent laser ablation of accessory branch of left GSV in April 2016 by Dr. Kellie Simmering at [facility]. She was previously wearing Unna boots, which she tolerated well. Tolerating 2 layer compression and cadexomer iodine. She returns  to clinic for follow-up and is without new complaints. She denies any significant pain at this time. She reports persistent pain with pressure. No claudication or ischemic rest pain. No fever or chills. No drainage. READMISSION 11/13/16; this is a 73 year old woman who is not a diabetic. She is here for a review of a painful area on her left medial lower extremity. I note that she was seen here previously last year for wound I believe to be in the same area. At that time she had undergone previously a left greater saphenous vein ablation by Dr. Kellie Simmering and she had a ablation of the anterior accessory branch of the left greater saphenous vein in March 2016. Seeing that the wound actually closed over. In reviewing the history with her today the ulcer in this area has been recurrent. She describes a biopsy of this area in 2009 that only showed stasis physiology. 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 Do not see that she has ever had arterial studies in the left leg. She tells me she has a follow-up with Dr. Kellie Simmering in roughly  10 days In any case she developed the reopening of this area roughly a month ago. On the background of this she describes rapidly increasing edema which has responded to Lasix 40 mg and metolazone 2.5 mg as well as the patient's lymph massage. She has been told she has both venous insufficiency and lymphedema but she cannot tolerate compression stockings 11/28/16; the patient saw Dr. Kellie Simmering recently. Per the patient he did arterial Dopplers in the office that did not show evidence of arterial insufficiency, per the patient he stated "treat this like an ordinary venous ulcer". She also saw her dermatologist Dr. Ronnald Ramp who felt that this was more of a vascular ulcer. In general things are improving although she arrives today with increasing bilateral lower extremity edema with weeping a deeper fluid through the wound on the left medial leg compatible with some degree of lymphedema 12/04/16; the patient's wound is fully epithelialized but I don't think fully healed. We will do another week of depression with Promogran and TCA however I suspect we'll be able to discharge her next week. This is a very unusual-looking wound which was initially a figure-of-eight type wound lying on its side surrounded by petechial like hemorrhage. She has had venous ablation on this side. She apparently does not have an arterial issue per Dr. Kellie Simmering. She saw her dermatologist thought it was "vascular". Patient is definitely going to need ongoing compression and I talked about this with her today she will go to elastic therapy after she leaves here next week 12/11/16; the patient's wound is not completely closed today. She has surrounding scar tissue and in further discussion with the patient it would appear that she had ulcers in this area in 2009 for a prolonged period of time ultimately requiring a punch biopsy of this area that only showed venous insufficiency. I did not previously pickup on this part of the history from  the patient. 12/18/16; the patient's wound is completely epithelialized. There is no open area here. She has significant bilateral venous insufficiency with secondary lymphedema to a mild-to-moderate degree she does not have compression stockings.. She did not say anything to me when I was in the  room, she told our intake nurse that she was still having pain in this area. This isn't unusual recurrent small open area. She is going to go to elastic therapy to obtain compression stockings. 12/25/16; the patient's wound is fully epithelialized. There is no open area here. The patient describes some continued episodic discomfort in this area medial left calf. However everything looks fine and healed here. She is been to elastic therapy and caught herself 15-20 mmHg stockings, they apparently were having trouble getting 20-30 mm stockings in her size Amber, [name] ([id number]) 01/22/17; this is a patient we discharged from the clinic a month ago. She has a recurrent open wound on her medial left calf. She had 15 mm support stockings. I told her I thought she needed 20-30 mm compression stockings. She tells me that she has been ill with hospitalization secondary to asthma and is been found to have severe hypokalemia likely secondary to a combination of Lasix and metolazone. This morning she noted blistering and leaking fluid on the posterior part of her left leg. She called our intake nurse urgently and we was saw her this afternoon. She has not had any real discomfort here. I don't know that she's been wearing any stockings on this leg for at least 2-3 days. ABIs in this clinic were 1.21 on the right and 1.3 on the left. She is previously seen vascular surgery who does not think that there is a peripheral arterial issue. 01/30/17; Patient arrives with no open wound on the left leg. She has been to elastic therapy and obtained 20-12mmhg below knee stockings and she has one on the right leg  today. READMISSION 02/19/18; this Bowley is a now 73 year old patient we've had in this clinic perhaps 3 times before. I had last looked at her from January 07 December 2016 with an area on the medial left leg. We discharged her on 12/25/16 however she had to be readmitted on 01/22/17 with a recurrence. I have in my notes that we discharged her on 20-30 mm stockings although she tells me she was only wearing support hose because she cannot get stockings on predominantly related to her cervical spine surgery/issues. She has had previous ablations done by vein and vascular in [place] including a great saphenous vein ablation on the left with an anterior accessory branch ablation I think both of these were in 2016. On one of the previous visit she had a biopsy noted 2009 that was negative. She is not felt to have an arterial issue. She is not a diabetic. She does have a history of obstructive sleep apnea hypertension asthma as well as chronic venous insufficiency and lymphedema. On this occasion she noted 2 dry scaly patch on her left leg. She tried to put lotion on this it didn't really help. There were 2 open areas.the patient has been seeing her primary physician from 02/05/18 through 02/14/18. She had Unna boots applied. The superior wound now on the lateral left leg has closed but she's had one wound that remains open on the lateral left leg. This is not the same spot as we dealt with in 2018. ABIs in this clinic were 1.3 bilaterally 02/26/18; patient has a small wound on the left lateral calf. Dimensions are down. She has chronic venous insufficiency and lymphedema. 03/05/18; small open area on the left lateral calf. Dimensions are down. Tightly adherent necrotic debris over the surface of the wound which was difficult to remove. Also the dressing [over collagen] stuck to the  wound surface. This was removed with some difficulty as well. Change the primary dressing to Hydrofera Blue ready 03/12/18;  small open area on the left lateral calf. Comes in with tightly adherent surface eschar as well as some adherent Hydrofera Blue. 03/19/18; open area on the left lateral calf. Again adherent surface eschar as well as some adherent Hydrofera Blue nonviable subcutaneous tissue. She complained of pain all week even with the reduction from 4-3 layer compression I put on last week. Also she had an increase in her ankle and calf measurements probably related to the same thing. 03/26/18; open area on the left lateral calf. A very small open area remains here. We used silver alginate starting last week as the Hydrofera Blue seem to stick to the wound bed. In using 4-layer compression 04/02/18; the open area in the left lateral calf at some adherent slough which I removed there is no open area here. We are able to transition her into her own compression stocking. Truthfully I think this is probably his support hose. However this does not maintain skin integrity will be limited. She cannot put over the toe compression stockings on because of neck problems hand problems etc. She is allergic to the lining layer of juxta lites. We might be forced to use extremitease stocking should this fail READMIT 11/24/2018 Patient is now a 73 year old woman who is not a diabetic. She has been in this clinic on at least 3 previous occasions largely with recurrent wounds on her left leg secondary to chronic venous insufficiency with secondary lymphedema. Her situation is complicated by inability to get stockings on and an allergy to neoprene which is apparently a component and at least juxta lites and other stockings. As a result she really has not been wearing any stockings on her legs. She tells [place] that roughly 2 or 3 weeks ago she started noticing a stinging sensation just above her ankle on the left medial aspect. She has been diagnosed with pseudogout and she wondered whether this was what she was experiencing. She tried to  dress this with something she bought at the store however subsequently it pulled skin off and now she has an open wound that is not improving. She has been using Vaseline gauze with a cover bandage. She saw her primary doctor last week who put an Haematologist on her. ABIs in this clinic was 1.03 on the left Amber, [name]. ([id number]) 2/12; the area is on the left medial ankle. Odd-looking wound with what looks to be surface epithelialization but a multitude of small petechial openings. This clearly not closed yet. We have been using silver alginate under 3 layer compression with TCA 2/19; the wound area did not look quite as good this week. Necrotic debris over the majority of the wound surface which required debridement. She continues to have a multitude of what looked to be small petechial openings. She reminds [place] that she had a biopsy on this initially during her first outbreak in 2015 in [place] dermatology. She expresses concern about this being a possible melanoma. 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 I assured her that this does not look anything like melanoma. Except for the petechial reaction it does look like a venous insufficiency area and she certainly has evidence of this on both sides 2/26; a difficult area on the left medial ankle. The patient clearly has chronic venous hypertension with some degree  of lymphedema. The odd thing about the area is the small petechial hemorrhages. I am not really sure how to explain this. This was present last time and this is not a compression injury. We have been using Hydrofera Blue which I changed to last week 3/4; still using Hydrofera Blue. Aggressive debridement today. She does not have known arterial issues. She has seen Dr. Kellie Simmering at [facility] vein [facility] vascular and and has an ablation on the left. [Anterior accessory branch of the  greater saphenous]. From what I remember they did not feel she had an arterial issue. The patient has had this area biopsied in 2009 at [facility] dermatology and by her recollection they said this was "stasis". She is also follow-up with dermatology locally who thought that this was more of a vascular issue 3/11; using Hydrofera Blue. Aggressive debridement today. She does not have an arterial issue. We are using 3 layer compression although we may need to go to 4. The patient has been in for multiple changes to her wrap since I last saw her a week ago. She says that the area was leaking. I do not have too much more information on what was found 01/19/19 on evaluation today patient was actually being seen for a nurse visit when unfortunately she had the area on her left lateral lower extremity as well as weeping from the right lower extremity that became apparent. Therefore we did end up actually seeing her for a full visit with myself. She is having some pain at this site as well but fortunately nothing too significant at this point. No fevers, chills, nausea, or vomiting noted at this time. 3/18-Patient is back to the clinic with the left leg venous leg ulcer, the ulcer is larger in size, has a surface that is densely adherent with fibrinous tissue, the Hydrofera Blue was used but is densely adherent and there was difficulty in removing it. The right lower extremity was also wrapped for weeping edema. Patient has a new area over the left lateral foot above the malleolus that is small and appears to have no debris with intact surrounding skin. Patient is on increased dose of Lasix also as a means to edema management 3/25; the patient has a nonhealing venous ulcer on the medial left leg and last week developed a smaller area on the lateral left calf. We have been using Hydrofera Blue with a contact layer. 4/1; no major change in these wounds areas. Left medial and more recently left lateral calf. I  tried Iodoflex last week to aid in debridement she did not tolerate this. She stated her pain was terrible all week. She took the top layer of the 4 layer compression off. 4/8; the patient actually looks somewhat better in terms of her more prominent left lateral calf wound. There is some healthy looking tissue here. She is still complaining of a lot of discomfort. 4/15; patient in a lot of pain secondary to sciatica. She is on a prednisone taper prescribed by her primary physician. She has the 2 areas one on the left medial and more recently a smaller area on the left lateral calf. Both of these just above the malleoli 4/22; her back pain is better but she still states she is very uncomfortable and now feels she is intolerant to the [employer]. No real change in the wounds we have been using Sorbact. She has been previously intolerant to Iodoflex. There is not a lot of option about what we can use to  debride this wound under compression that she no doubt needs. sHe states Ultram no longer works for her pain 4/29; no major change in the wounds slightly increased depth. Surface on the original medial wound perhaps somewhat improved however the more recent area on the lateral left ankle is 100% covered in very adherent debris we have been using Sorbact. She tolerates 4 layer compression well and her edema control is a lot better. She has not had to come in for a nurse check 5/6; no major change in the condition of the wounds. She did consent to debridement today which was done with some difficulty. Continuing Sorbact. She did not tolerate Iodoflex. She was in for a check of her compression the day after we wrapped her last week this was adjusted but nothing much was found 5/13; no major change in the condition or area of the wounds. I was able to get a fairly aggressive debridement done on the lateral left leg wound. Even using Sorbact under compression. She came back in on Friday to have the wrap  changed. She says she felt uncomfortable on the lateral aspect of her ankle. She has a long history of chronic venous insufficiency including previous ablation surgery on this side. 5/20-Patient returns for wounds on left leg with both wounds covered in slough, with the lateral leg wound larger in size, she has been in 3 layer compression and felt more comfortable, she describes pain in ankle, in leg and pins and needles in foot, and is about to try Pamelor for this 6/3; wounds on the left lateral and left medial leg. The area medially which is the most recent of the 2 seems to have had the largest increase in dimensions. We have been using Sorbac to try and debride the surface. She has been to see orthopedics they apparently did a plain x-ray that was indeterminant. Diagnosed her with neuropathy and they have ordered an MRI to Amber, [name]. ([id number]) determine if there is underlying osteomyelitis. This was not high on my thought list but I suppose it is prudent. We have advised her to make an appointment with vein and vascular in [place]. She has a history of a left greater saphenous and accessory vein ablations I wonder if there is anything else that can be done from a surgical point of view to help in these difficult refractory wounds. We have previously healed this wound on one occasion but it keeps on reopening [medial side] 6/10; deep tissue culture I did last week I think on the left medial wound showed both moderate E. coli and moderate staph aureus [MSSA]. She is going to require antibiotics and I have chosen Augmentin. We have been using Sorbact and we have made better looking wound surface on both sides but certainly no improvement in wound area. She was back in last Friday apparently for a dressing changes the wrap was hurting her outer left ankle. She has not managed to get a hold of vein and vascular in [place]. We are going to have to make her that appointment 6/17;  patient is tolerating the Augmentin. She had an MRI that I think was ordered by orthopedic surgeon this did not show osteomyelitis or an abscess did suggest cellulitis. We have been using Sorbact to the lateral and medial ankles. We have been trying to arrange a follow-up appointment with vein and vascular in [place] or did her original ablations. We apparently an area sent the request to vein and vascular in [facility] 6/24;  patient has completed the Augmentin. We do not yet have a vein and vascular appointment in [place]. I am not sure what the issue is here we have asked her to call tomorrow. We are using Sorbact. Making some improvements and especially the medial wound. Both surfaces however look better medial and lateral. 7/1; the patient has been in contact with vein and vascular in [place] but has not yet received an appointment. Using Sorbact we have gradually improve the wound surface with no improvement in surface area. She is approved for Apligraf but the wound surface still is not completely viable. She has not had to come in for a dressing change 7/8; the patient has an appointment with vein and vascular on 7/31 which is a Friday afternoon. She is concerned about getting back here for [place] to dress her wounds. I think it is important to have them goal for her venous reflux/history of ablations etc. to see if anything else can be done. She apparently tested positive for 1 of the blood tests with regards to lupus and saw a rheumatologist. He has raised the issue of vasculitis again. I have had this thought in the past however the evidence seems overwhelming that this is a venous reflux etiology. If the rheumatologist tells me there is clinical and laboratory investigation is positive for lupus I will rethink this. 7/15; the patient's wound surfaces are quite a bit better. The medial area which was her original wound now has no depth although the lateral wound which was the more  recent area actually appears larger. Both with viable surfaces which is indeed better. Using Sorbact. I wanted to use Apligraf on her however there is the issue of the vein and vascular appointment on 7/31 at 2:00 in the afternoon which would not allow her to get back to be rewrapped and they would no doubt remove the graft 7/22; the patient's wound surfaces have moderate amount of debris although generally look better. The lateral one is larger with 2 small satellite areas superiorly. We are waiting for her vein and vascular appointment on 7/31. She has been approved for Apligraf which I would like to use after th 7/29; wound surfaces have improved no debridement is required we have been using Sorbact. She sees vein and vascular on Friday with this so question of whether anything can be done to lessen the likelihood of recurrence and/or speed the healing of these areas. She is already had previous ablations. She no doubt has severe venous hypertension 8/5-Patient returns at 1 week, she was in [place] for 3 days by her podiatrist, we have been using so backed to the wound, she has increased pain in both the wounds on the left lower leg especially the more distal one on the lateral aspect 8/12-Patient returns at 1 week and she is agreeable to having debridement in both wounds on her left leg today. We have been using Sorbact, and vascular studies were reviewed at last visit Electronic Signature(s) Signed: 06/17/2019 1:37:51 PM By: [name] Entered By: [name] on 06/17/2019 13:37:51 [name], [name] ([id number]) -------------------------------------------------------------------------------- Physical Exam Details Patient Name: SEANNE, CHIRICO. Date of Service: 06/17/2019 12:45 PM Medical Record Number: [id number] Patient Account Number: [id number] Date of Birth/Sex: 1946/01/22 (73 y.o. F) Treating RN: [name] Primary Care [name]: [name] Other Clinician: Referring  [name]: [name] Treating [name]/Extender: [name] in Treatment: 27 Constitutional alert and oriented x 3. sitting or standing blood pressure is within target range for patient.Marland Kitchen  supine blood pressure is within target range for patient.. pulse regular and within target range for patient.Marland Kitchen respirations regular, non-labored and within target range for patient.Marland Kitchen temperature within target range for patient.. . . [name] and well-hydrated in no acute distress. Notes Both wounds appear slightly bigger, covered with slough, I debrided with #5 curette and removed a fair amount of slough from both wounds which were layered almost 100% Surrounding skin otherwise appears intact there is small areas of excoriations noted but periwound skin itself is not inflamed Electronic Signature(s) Signed: 06/17/2019 1:38:58 PM By: [name] Entered By: [name] on 06/17/2019 13:38:58 Liebman, [name] ([id number]) -------------------------------------------------------------------------------- Physician Orders Details Patient Name: CORBIN, HOTT. Date of Service: 06/17/2019 12:45 PM Medical Record Number: [id number] Patient Account Number: [id number] Date of Birth/Sex: [birth year] (73 y.o. F) Treating RN: [name] Primary Care [name]: [name] Other Clinician: Referring [name]: [name] Treating [name]/Extender: [name] in Treatment: 35 Verbal / Phone Orders: No Diagnosis Coding Wound Cleansing Wound #5 Left,Medial Lower Leg o Clean wound with Normal Saline. o May Shower, gently pat wound dry prior to applying new dressing. Wound #6 Left,Lateral Lower Leg o Clean wound with Normal Saline. o May Shower, gently pat wound dry prior to applying new dressing. Anesthetic (add to Medication List) Wound #5 Left,Medial Lower Leg o Topical Lidocaine 4% cream applied to wound bed prior to debridement (In Clinic Only). o  Benzocaine Topical Anesthetic Spray applied to wound bed prior to debridement (In Clinic Only). Wound #6 Left,Lateral Lower Leg o Topical Lidocaine 4% cream applied to wound bed prior to debridement (In Clinic Only). o Benzocaine Topical Anesthetic Spray applied to wound bed prior to debridement (In Clinic Only). Skin Barriers/Peri-Wound Care Wound #5 Left,Medial Lower Leg o Barrier cream - Zinc oxide o Other: - Antibiotic ointment on irritated areas Wound #6 Left,Lateral Lower Leg o Barrier cream - Zinc oxide o Other: - Antibiotic ointment on irritated areas Primary Wound Dressing Wound #5 Left,Medial Lower Leg o Other: - Sorbact Wound #6 Left,Lateral Lower Leg o Other: - Sorbact Secondary Dressing Wound #5 Left,Medial Lower Leg o XtraSorb Wound #6 Left,Lateral Lower Leg o XtraSorb Dressing Change Frequency Wound #5 Left,Medial Lower Leg Amber, [name]. ([id number]) o Change dressing every week Wound #6 Left,Lateral Lower Leg o Change dressing every week Follow-up Appointments Wound #5 Left,Medial Lower Leg o Return Appointment in 1 week. o Nurse Visit as needed Wound #6 Left,Lateral Lower Leg o Return Appointment in 1 week. o Nurse Visit as needed Edema Control Wound #5 Left,Medial Lower Leg o 3 Layer Compression System - Left Lower Extremity Wound #6 Left,Lateral Lower Leg o 3 Layer Compression System - Left Lower Extremity Off-Loading Wound #5 Left,Medial Lower Leg o Other: - Elevate legs as needed Wound #6 Left,Lateral Lower Leg o Other: - Elevate legs as needed Electronic Signature(s) Signed: 06/17/2019 4:28:38 PM By: [name] Signed: 06/17/2019 5:00:13 PM By: [name], [name], RN, CWS, [name], [name] Entered By: [name], [name], RN, CWS, [name] on 06/17/2019 13:36:05 Hartmann, [name] ([id number]) -------------------------------------------------------------------------------- Progress Note Details Patient Name: Amber, [name]. Date of Service: 06/17/2019 12:45 PM Medical Record Number: [id number] Patient Account Number: [id number] Date of Birth/Sex: 25-[name]-1946 (73 y.o. F) Treating RN: [name] Primary Care [name]: [name] Other Clinician: Referring [name]: [name] Treating [name]/Extender: [name] in Treatment: 27 Subjective History of Present Illness (HPI) Pleasant 73 year old with history of chronic venous insufficiency. No diabetes or peripheral vascular disease. Left ABI 1.29. Questionable  history of left lower extremity DVT. She developed a recurrent ulceration on her left lateral calf in December 2015, which she attributes to poor diet and subsequent lower extremity edema. She underwent endovenous laser ablation of her left greater saphenous vein in 2010. She underwent laser ablation of accessory branch of left GSV in April 2016 by Dr. Kellie Simmering at [facility]. She was previously wearing Unna boots, which she tolerated well. Tolerating 2 layer compression and cadexomer iodine. She returns to clinic for follow-up and is without new complaints. She denies any significant pain at this time. She reports persistent pain with pressure. No claudication or ischemic rest pain. No fever or chills. No drainage. READMISSION 11/13/16; this is a 73 year old woman who is not a diabetic. She is here for a review of a painful area on her left medial lower extremity. I note that she was seen here previously last year for wound I believe to be in the same area. At that time she had undergone previously a left greater saphenous vein ablation by Dr. Kellie Simmering and she had a ablation of the anterior accessory branch of the left greater saphenous vein in March 2016. Seeing that the wound actually closed over. In reviewing the history with her today the ulcer in this area has been recurrent. She describes a biopsy of this area in 2009 that only showed stasis physiology. She also has a history of  today malignant melanoma in the right shoulder for which she follows with Dr. 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 Do not see that she has ever had arterial studies in the left leg. She tells me she has a follow-up with Dr. Kellie Simmering in roughly 10 days In any case she developed the reopening of this area roughly a month ago. On the background of this she describes rapidly increasing edema which has responded to Lasix 40 mg and metolazone 2.5 mg as well as the patient's lymph massage. She has been told she has both venous insufficiency and lymphedema but she cannot tolerate compression stockings 11/28/16; the patient saw Dr. Kellie Simmering recently. Per the patient he did arterial Dopplers in the office that did not show evidence of arterial insufficiency, per the patient he stated "treat this like an ordinary venous ulcer". She also saw her dermatologist Dr. Ronnald Ramp who felt that this was more of a vascular ulcer. In general things are improving although she arrives today with increasing bilateral lower extremity edema with weeping a deeper fluid through the wound on the left medial leg compatible with some degree of lymphedema 12/04/16; the patient's wound is fully epithelialized but I don't think fully healed. We will do another week of depression with Promogran and TCA however I suspect we'll be able to discharge her next week. This is a very unusual-looking wound which was initially a figure-of-eight type wound lying on its side surrounded by petechial like hemorrhage. She has had venous ablation on this side. She apparently does not have an arterial issue per Dr. Kellie Simmering. She saw her dermatologist thought it was "vascular". Patient is definitely going to need ongoing compression and I talked about this with her today she will go to elastic therapy after she leaves here next week 12/11/16;  the patient's wound is not completely closed today. She has surrounding scar tissue and in further discussion with the patient it would appear that she had ulcers in  this area in 2009 for a prolonged period of time ultimately requiring a punch biopsy of this area that only showed venous insufficiency. I did not previously pickup on this part of the history from the patient. 12/18/16; the patient's wound is completely epithelialized. There is no open area here. She has significant bilateral venous insufficiency with secondary lymphedema to a mild-to-moderate degree she does not have compression stockings.. She did not say anything to me when I was in the room, she told our intake nurse that she was still having pain in this area. This isn't unusual recurrent small open area. She is going to go to elastic therapy to obtain compression stockings. 12/25/16; the patient's wound is fully epithelialized. There is no open area here. The patient describes some continued episodic discomfort in this area medial left calf. However everything looks fine and healed here. She is been to elastic therapy and YARENIS, CERINO ([id number]) caught herself 15-20 mmHg stockings, they apparently were having trouble getting 20-30 mm stockings in her size 01/22/17; this is a patient we discharged from the clinic a month ago. She has a recurrent open wound on her medial left calf. She had 15 mm support stockings. I told her I thought she needed 20-30 mm compression stockings. She tells me that she has been ill with hospitalization secondary to asthma and is been found to have severe hypokalemia likely secondary to a combination of Lasix and metolazone. This morning she noted blistering and leaking fluid on the posterior part of her left leg. She called our intake nurse urgently and we was saw her this afternoon. She has not had any real discomfort here. I don't know that she's been wearing any stockings on this leg for at least  2-3 days. ABIs in this clinic were 1.21 on the right and 1.3 on the left. She is previously seen vascular surgery who does not think that there is a peripheral arterial issue. 01/30/17; Patient arrives with no open wound on the left leg. She has been to elastic therapy and obtained 20-84mmhg below knee stockings and she has one on the right leg today. READMISSION 02/19/18; this Brownley is a now 73 year old patient we've had in this clinic perhaps 3 times before. I had last looked at her from January 07 December 2016 with an area on the medial left leg. We discharged her on 12/25/16 however she had to be readmitted on 01/22/17 with a recurrence. I have in my notes that we discharged her on 20-30 mm stockings although she tells me she was only wearing support hose because she cannot get stockings on predominantly related to her cervical spine surgery/issues. She has had previous ablations done by vein and vascular in [place] including a great saphenous vein ablation on the left with an anterior accessory branch ablation I think both of these were in 2016. On one of the previous visit she had a biopsy noted 2009 that was negative. She is not felt to have an arterial issue. She is not a diabetic. She does have a history of obstructive sleep apnea hypertension asthma as well as chronic venous insufficiency and lymphedema. On this occasion she noted 2 dry scaly patch on her left leg. She tried to put lotion on this it didn't really help. There were 2 open areas.the patient has been seeing her primary physician from 02/05/18 through 02/14/18. She had Unna boots applied. The superior wound now on the lateral left leg has closed but she's had one wound that remains  open on the lateral left leg. This is not the same spot as we dealt with in 2018. ABIs in this clinic were 1.3 bilaterally 02/26/18; patient has a small wound on the left lateral calf. Dimensions are down. She has chronic venous insufficiency  and lymphedema. 03/05/18; small open area on the left lateral calf. Dimensions are down. Tightly adherent necrotic debris over the surface of the wound which was difficult to remove. Also the dressing [over collagen] stuck to the wound surface. This was removed with some difficulty as well. Change the primary dressing to Hydrofera Blue ready 03/12/18; small open area on the left lateral calf. Comes in with tightly adherent surface eschar as well as some adherent Hydrofera Blue. 03/19/18; open area on the left lateral calf. Again adherent surface eschar as well as some adherent Hydrofera Blue nonviable subcutaneous tissue. She complained of pain all week even with the reduction from 4-3 layer compression I put on last week. Also she had an increase in her ankle and calf measurements probably related to the same thing. 03/26/18; open area on the left lateral calf. A very small open area remains here. We used silver alginate starting last week as the Hydrofera Blue seem to stick to the wound bed. In using 4-layer compression 04/02/18; the open area in the left lateral calf at some adherent slough which I removed there is no open area here. We are able to transition her into her own compression stocking. Truthfully I think this is probably his support hose. However this does not maintain skin integrity will be limited. She cannot put over the toe compression stockings on because of neck problems hand problems etc. She is allergic to the lining layer of juxta lites. We might be forced to use extremitease stocking should this fail READMIT 11/24/2018 Patient is now a 73 year old woman who is not a diabetic. She has been in this clinic on at least 3 previous occasions largely with recurrent wounds on her left leg secondary to chronic venous insufficiency with secondary lymphedema. Her situation is complicated by inability to get stockings on and an allergy to neoprene which is apparently a component and at  least juxta lites and other stockings. As a result she really has not been wearing any stockings on her legs. She tells [place] that roughly 2 or 3 weeks ago she started noticing a stinging sensation just above her ankle on the left medial aspect. She has been diagnosed with pseudogout and she wondered whether this was what she was experiencing. She tried to dress this with something she bought at the store however subsequently it pulled skin off and now she has an open wound that is not improving. She has been using Vaseline gauze with a cover bandage. She saw her primary doctor last week who put an Haematologist on her. ABIs in this clinic was 1.03 on the left Amber, [name]. ([id number]) 2/12; the area is on the left medial ankle. Odd-looking wound with what looks to be surface epithelialization but a multitude of small petechial openings. This clearly not closed yet. We have been using silver alginate under 3 layer compression with TCA 2/19; the wound area did not look quite as good this week. Necrotic debris over the majority of the wound surface which required debridement. She continues to have a multitude of what looked to be small petechial openings. She reminds [place] that she had a biopsy on this initially during her first outbreak in 2015 in [place] dermatology. She expresses  concern about this being a possible melanoma. 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 I assured her that this does not look anything like melanoma. Except for the petechial reaction it does look like a venous insufficiency area and she certainly has evidence of this on both sides 2/26; a difficult area on the left medial ankle. The patient clearly has chronic venous hypertension with some degree of lymphedema. The odd thing about the area is the small petechial hemorrhages. I am not really sure how to explain this. This was present  last time and this is not a compression injury. We have been using Hydrofera Blue which I changed to last week 3/4; still using Hydrofera Blue. Aggressive debridement today. She does not have known arterial issues. She has seen Dr. Kellie Simmering at [facility] vein and vascular and and has an ablation on the left. [Anterior accessory branch of the greater saphenous]. From what I remember they did not feel she had an arterial issue. The patient has had this area biopsied in 2009 at [facility] dermatology and by her recollection they said this was "stasis". She is also follow-up with dermatology locally who thought that this was more of a vascular issue 3/11; using Hydrofera Blue. Aggressive debridement today. She does not have an arterial issue. We are using 3 layer compression although we may need to go to 4. The patient has been in for multiple changes to her wrap since I last saw her a week ago. She says that the area was leaking. I do not have too much more information on what was found 01/19/19 on evaluation today patient was actually being seen for a nurse visit when unfortunately she had the area on her left lateral lower extremity as well as weeping from the right lower extremity that became apparent. Therefore we did end up actually seeing her for a full visit with myself. She is having some pain at this site as well but fortunately nothing too significant at this point. No fevers, chills, nausea, or vomiting noted at this time. 3/18-Patient is back to the clinic with the left leg venous leg ulcer, the ulcer is larger in size, has a surface that is densely adherent with fibrinous tissue, the Hydrofera Blue was used but is densely adherent and there was difficulty in removing it. The right lower extremity was also wrapped for weeping edema. Patient has a new area over the left lateral foot above the malleolus that is small and appears to have no debris with intact surrounding skin. Patient is on  increased dose of Lasix also as a means to edema management 3/25; the patient has a nonhealing venous ulcer on the medial left leg and last week developed a smaller area on the lateral left calf. We have been using Hydrofera Blue with a contact layer. 4/1; no major change in these wounds areas. Left medial and more recently left lateral calf. I tried Iodoflex last week to aid in debridement she did not tolerate this. She stated her pain was terrible all week. She took the top layer of the 4 layer compression off. 4/8; the patient actually looks somewhat better in terms of her more prominent left lateral calf wound. There is some healthy looking tissue here. She is still complaining of a lot of discomfort. 4/15; patient in a lot of pain secondary to sciatica. She is on a prednisone taper prescribed by her primary physician. She has  the 2 areas one on the left medial and more recently a smaller area on the left lateral calf. Both of these just above the malleoli 4/22; her back pain is better but she still states she is very uncomfortable and now feels she is intolerant to the [employer]. No real change in the wounds we have been using Sorbact. She has been previously intolerant to Iodoflex. There is not a lot of option about what we can use to debride this wound under compression that she no doubt needs. sHe states Ultram no longer works for her pain 4/29; no major change in the wounds slightly increased depth. Surface on the original medial wound perhaps somewhat improved however the more recent area on the lateral left ankle is 100% covered in very adherent debris we have been using Sorbact. She tolerates 4 layer compression well and her edema control is a lot better. She has not had to come in for a nurse check 5/6; no major change in the condition of the wounds. She did consent to debridement today which was done with some difficulty. Continuing Sorbact. She did not tolerate Iodoflex. She was  in for a check of her compression the day after we wrapped her last week this was adjusted but nothing much was found 5/13; no major change in the condition or area of the wounds. I was able to get a fairly aggressive debridement done on the lateral left leg wound. Even using Sorbact under compression. She came back in on Friday to have the wrap changed. She says she felt uncomfortable on the lateral aspect of her ankle. She has a long history of chronic venous insufficiency including previous ablation surgery on this side. 5/20-Patient returns for wounds on left leg with both wounds covered in slough, with the lateral leg wound larger in size, she has been in 3 layer compression and felt more comfortable, she describes pain in ankle, in leg and pins and needles in foot, and is about to try Pamelor for this 6/3; wounds on the left lateral and left medial leg. The area medially which is the most recent of the 2 seems to have had the largest increase in dimensions. We have been using Sorbac to try and debride the surface. She has been to see orthopedics Amber, [name] ([id number]) they apparently did a plain x-ray that was indeterminant. Diagnosed her with neuropathy and they have ordered an MRI to determine if there is underlying osteomyelitis. This was not high on my thought list but I suppose it is prudent. We have advised her to make an appointment with vein and vascular in [place]. She has a history of a left greater saphenous and accessory vein ablations I wonder if there is anything else that can be done from a surgical point of view to help in these difficult refractory wounds. We have previously healed this wound on one occasion but it keeps on reopening [medial side] 6/10; deep tissue culture I did last week I think on the left medial wound showed both moderate E. coli and moderate staph aureus [MSSA]. She is going to require antibiotics and I have chosen Augmentin. We have been using  Sorbact and we have made better looking wound surface on both sides but certainly no improvement in wound area. She was back in last Friday apparently for a dressing changes the wrap was hurting her outer left ankle. She has not managed to get a hold of vein and vascular in [place]. We are going  to have to make her that appointment 6/17; patient is tolerating the Augmentin. She had an MRI that I think was ordered by orthopedic surgeon this did not show osteomyelitis or an abscess did suggest cellulitis. We have been using Sorbact to the lateral and medial ankles. We have been trying to arrange a follow-up appointment with vein and vascular in [place] or did her original ablations. We apparently an area sent the request to vein and vascular in [facility] 6/24; patient has completed the Augmentin. We do not yet have a vein and vascular appointment in [place]. I am not sure what the issue is here we have asked her to call tomorrow. We are using Sorbact. Making some improvements and especially the medial wound. Both surfaces however look better medial and lateral. 7/1; the patient has been in contact with vein and vascular in [place] but has not yet received an appointment. Using Sorbact we have gradually improve the wound surface with no improvement in surface area. She is approved for Apligraf but the wound surface still is not completely viable. She has not had to come in for a dressing change 7/8; the patient has an appointment with vein and vascular on 7/31 which is a Friday afternoon. She is concerned about getting back here for [place] to dress her wounds. I think it is important to have them goal for her venous reflux/history of ablations etc. to see if anything else can be done. She apparently tested positive for 1 of the blood tests with regards to lupus and saw a rheumatologist. He has raised the issue of vasculitis again. I have had this thought in the past however the evidence seems  overwhelming that this is a venous reflux etiology. If the rheumatologist tells me there is clinical and laboratory investigation is positive for lupus I will rethink this. 7/15; the patient's wound surfaces are quite a bit better. The medial area which was her original wound now has no depth although the lateral wound which was the more recent area actually appears larger. Both with viable surfaces which is indeed better. Using Sorbact. I wanted to use Apligraf on her however there is the issue of the vein and vascular appointment on 7/31 at 2:00 in the afternoon which would not allow her to get back to be rewrapped and they would no doubt remove the graft 7/22; the patient's wound surfaces have moderate amount of debris although generally look better. The lateral one is larger with 2 small satellite areas superiorly. We are waiting for her vein and vascular appointment on 7/31. She has been approved for Apligraf which I would like to use after th 7/29; wound surfaces have improved no debridement is required we have been using Sorbact. She sees vein and vascular on Friday with this so question of whether anything can be done to lessen the likelihood of recurrence and/or speed the healing of these areas. She is already had previous ablations. She no doubt has severe venous hypertension 8/5-Patient returns at 1 week, she was in [place] for 3 days by her podiatrist, we have been using so backed to the wound, she has increased pain in both the wounds on the left lower leg especially the more distal one on the lateral aspect 8/12-Patient returns at 1 week and she is agreeable to having debridement in both wounds on her left leg today. We have been using Sorbact, and vascular studies were reviewed at last visit Objective Constitutional alert and oriented x 3. sitting or standing  blood pressure is within target range for patient.. supine blood pressure is within target range for patient.. pulse  regular and within target range for patient.Marland Kitchen respirations regular, non-labored and within target range for patient.Marland Kitchen temperature within target range for patient.. [name] and well-hydrated in no acute distress. Vitals Time Taken: 12:50 PM, Height: 63 in, Weight: 224.7 lbs, BMI: 39.8, Temperature: 99.0 F, Pulse: 96 bpm, Respiratory [name], Ezella J. ([id number]) Rate: 16 breaths/min, Blood Pressure: 149/77 mmHg. General Notes: Both wounds appear slightly bigger, covered with slough, I debrided with #5 curette and removed a fair amount of slough from both wounds which were layered almost 100% Surrounding skin otherwise appears intact there is small areas of excoriations noted but periwound skin itself is not inflamed Integumentary (Hair, Skin) Wound #5 status is Open. Original cause of wound was Gradually Appeared. The wound is located on the Left,Medial Lower Leg. The wound measures 2.1cm length x 2.7cm width x 0.2cm depth; 4.453cm^2 area and 0.891cm^3 volume. There is Fat Layer (Subcutaneous Tissue) Exposed exposed. There is no tunneling or undermining noted. There is a large amount of purulent drainage noted. The wound margin is indistinct and nonvisible. There is medium (34-66%) pink granulation within the wound bed. There is a medium (34-66%) amount of necrotic tissue within the wound bed including Adherent Slough. Wound #6 status is Open. Original cause of wound was Gradually Appeared. The wound is located on the Left,Lateral Lower Leg. The wound measures 3.5cm length x 3.9cm width x 0.2cm depth; 10.721cm^2 area and 2.144cm^3 volume. There is Fat Layer (Subcutaneous Tissue) Exposed exposed. There is no tunneling or undermining noted. There is a large amount of purulent drainage noted. The wound margin is indistinct and nonvisible. There is medium (34-66%) pink granulation within the wound bed. There is a medium (34-66%) amount of necrotic tissue within the wound bed including Adherent  Slough. Procedures Wound #5 Pre-procedure diagnosis of Wound #5 is a Lymphedema located on the Left,Medial Lower Leg . There was a Excisional Skin/Subcutaneous Tissue Debridement with a total area of 5.67 sq cm performed by Amber Bastos, MD. With the following instrument(s): Curette to remove Viable and Non-Viable tissue/material. Material removed includes Subcutaneous Tissue and Biofilm and after achieving pain control using Lidocaine. No specimens were taken. A time out was conducted at 13:30, prior to the start of the procedure. A Moderate amount of bleeding was controlled with Pressure. The procedure was tolerated well. Post Debridement Measurements: 2.1cm length x 2.7cm width x 0.3cm depth; 1.336cm^3 volume. Character of Wound/Ulcer Post Debridement is improved. Post procedure Diagnosis Wound #5: Same as Pre-Procedure Wound #6 Pre-procedure diagnosis of Wound #6 is a Venous Leg Ulcer located on the Left,Lateral Lower Leg .Severity of Tissue Pre Debridement is: Fat layer exposed. There was a Excisional Skin/Subcutaneous Tissue Debridement with a total area of 13.65 sq cm performed by Amber Bastos, MD. With the following instrument(s): Curette to remove Viable and Non-Viable tissue/material. Material removed includes Subcutaneous Tissue and Biofilm and after achieving pain control using Lidocaine. No specimens were taken. A time out was conducted at 13:30, prior to the start of the procedure. A Moderate amount of bleeding was controlled with Pressure. The procedure was tolerated well. Post Debridement Measurements: 3.5cm length x 3.9cm width x 0.3cm depth; 3.216cm^3 volume. Character of Wound/Ulcer Post Debridement is stable. Severity of Tissue Post Debridement is: Fat layer exposed. Post procedure Diagnosis Wound #6: Same as Pre-Procedure Plan Wound Cleansing: Wound #5 Left,Medial Lower Leg: AURY, SCOLLARD. ([id number]) Clean wound with Normal  Saline. May Shower, gently pat wound  dry prior to applying new dressing. Wound #6 Left,Lateral Lower Leg: Clean wound with Normal Saline. May Shower, gently pat wound dry prior to applying new dressing. Anesthetic (add to Medication List): Wound #5 Left,Medial Lower Leg: Topical Lidocaine 4% cream applied to wound bed prior to debridement (In Clinic Only). Benzocaine Topical Anesthetic Spray applied to wound bed prior to debridement (In Clinic Only). Wound #6 Left,Lateral Lower Leg: Topical Lidocaine 4% cream applied to wound bed prior to debridement (In Clinic Only). Benzocaine Topical Anesthetic Spray applied to wound bed prior to debridement (In Clinic Only). Skin Barriers/Peri-Wound Care: Wound #5 Left,Medial Lower Leg: Barrier cream - Zinc oxide Other: - Antibiotic ointment on irritated areas Wound #6 Left,Lateral Lower Leg: Barrier cream - Zinc oxide Other: - Antibiotic ointment on irritated areas Primary Wound Dressing: Wound #5 Left,Medial Lower Leg: Other: - Sorbact Wound #6 Left,Lateral Lower Leg: Other: - Sorbact Secondary Dressing: Wound #5 Left,Medial Lower Leg: XtraSorb Wound #6 Left,Lateral Lower Leg: XtraSorb Dressing Change Frequency: Wound #5 Left,Medial Lower Leg: Change dressing every week Wound #6 Left,Lateral Lower Leg: Change dressing every week Follow-up Appointments: Wound #5 Left,Medial Lower Leg: Return Appointment in 1 week. Nurse Visit as needed Wound #6 Left,Lateral Lower Leg: Return Appointment in 1 week. Nurse Visit as needed Edema Control: Wound #5 Left,Medial Lower Leg: 3 Layer Compression System - Left Lower Extremity Wound #6 Left,Lateral Lower Leg: 3 Layer Compression System - Left Lower Extremity Off-Loading: Wound #5 Left,Medial Lower Leg: Other: - Elevate legs as needed Wound #6 Left,Lateral Lower Leg: Other: - Elevate legs as needed 1. Continue using Sorbact to wounds 2. Continue barrier cream 3. Continue 3 layer compression left lower extremity 4. Return to  clinic next week for discussion about skin products GENORA, ARP ([id number]) Electronic Signature(s) Signed: 06/17/2019 1:40:46 PM By: [name] Entered By: [name] on 06/17/2019 13:40:46 Darrington, [name] ([id number]) -------------------------------------------------------------------------------- SuperBill Details Patient Name: SYRENA, BURGES. Date of Service: 06/17/2019 Medical Record Number: [id number] Patient Account Number: [id number] Date of Birth/Sex: 22-May-1946 (73 y.o. F) Treating RN: [name] Primary Care [name]: [name] Other Clinician: Referring [name]: [name] Treating [name]/Extender: [name] in Treatment: 27 Diagnosis Coding ICD-10 Codes Code Description [phone] Non-pressure chronic ulcer of left calf limited to breakdown of skin I87.321 Chronic venous hypertension (idiopathic) with inflammation of right lower extremity I89.0 Lymphedema, not elsewhere classified Facility Procedures CPT4 Code: 86761950 Description: 93267 - DEB SUBQ TISSUE 20 SQ CM/< ICD-10 Diagnosis Description L97.221 Non-pressure chronic ulcer of left calf limited to breakdown Modifier: of skin Quantity: 1 Physician Procedures CPT4 Code: 1245809 Description: 98338 - WC PHYS SUBQ TISS 20 SQ CM ICD-10 Diagnosis Description L97.221 Non-pressure chronic ulcer of left calf limited to breakdown Modifier: of skin Quantity: 1 Electronic Signature(s) Signed: 06/17/2019 1:40:56 PM By: [name] Entered By: [name] on 06/17/2019 13:40:56

## 2019-06-24 ENCOUNTER — Other Ambulatory Visit: Payer: Self-pay

## 2019-06-24 ENCOUNTER — Encounter: Payer: Medicare Other | Admitting: Internal Medicine

## 2019-06-24 DIAGNOSIS — L97322 Non-pressure chronic ulcer of left ankle with fat layer exposed: Secondary | ICD-10-CM | POA: Diagnosis not present

## 2019-06-24 DIAGNOSIS — I87312 Chronic venous hypertension (idiopathic) with ulcer of left lower extremity: Secondary | ICD-10-CM | POA: Diagnosis not present

## 2019-06-24 DIAGNOSIS — I890 Lymphedema, not elsewhere classified: Secondary | ICD-10-CM | POA: Diagnosis not present

## 2019-06-24 DIAGNOSIS — I10 Essential (primary) hypertension: Secondary | ICD-10-CM | POA: Diagnosis not present

## 2019-06-24 DIAGNOSIS — L97221 Non-pressure chronic ulcer of left calf limited to breakdown of skin: Secondary | ICD-10-CM | POA: Diagnosis not present

## 2019-06-24 DIAGNOSIS — E876 Hypokalemia: Secondary | ICD-10-CM | POA: Diagnosis not present

## 2019-06-24 DIAGNOSIS — G629 Polyneuropathy, unspecified: Secondary | ICD-10-CM | POA: Diagnosis not present

## 2019-06-24 DIAGNOSIS — G4733 Obstructive sleep apnea (adult) (pediatric): Secondary | ICD-10-CM | POA: Diagnosis not present

## 2019-06-25 NOTE — Progress Notes (Addendum)
[name], [name] ([id number]) Visit Report for 06/24/2019 Arrival Information Details Patient Name: [name], [name]. Date of Service: 06/24/2019 12:45 PM Medical Record Number: [id number] Patient Account Number: [id number] Date of Birth/Sex: November 19, 1945 (73 y.o. F) Treating RN: Cornell Barman Primary Care Elya Diloreto: [name] Other Clinician: Referring Javon Hupfer: [name] Treating Eily Louvier/Extender: Tito Dine in Treatment: 28 Visit Information History Since Last Visit Added or deleted any medications: No Patient Arrived: Ambulatory Any new allergies or adverse reactions: No Arrival Time: 12:47 Had a fall or experienced change in No Accompanied By: self activities of daily living that may affect Transfer Assistance: None risk of falls: Signs or symptoms of abuse/neglect since last visito No Hospitalized since last visit: No Implantable device outside of the clinic excluding No cellular tissue based products placed in the center since last visit: Has Dressing in Place as Prescribed: Yes Has Compression in Place as Prescribed: Yes Pain Present Now: No Electronic Signature(s) Signed: 06/24/2019 4:09:28 PM By: Lorine Bears RCP, RRT, CHT Entered By: Lorine Bears on 06/24/2019 12:47:33 [name], [name] ([id number]) -------------------------------------------------------------------------------- Complex / Palliative Patient Assessment Details Patient Name: [name], [name]. Date of Service: 06/24/2019 12:45 PM Medical Record Number: [id number] Patient Account Number: [id number] Date of Birth/Sex: Mar 14, 1946 (73 y.o. F) Treating RN: Cornell Barman Primary Care Lajoy Vanamburg: [name] Other Clinician: Referring Tayjon Halladay: [name] Treating Rafay Dahan/Extender: Tito Dine in Treatment: 28 Palliative Management Criteria Complex Wound Management Criteria Patient requires a surgical procedure in order to achieve wound  healing: see below However, the patient does not wish to undergo the recommended surgical procedure. Care Approach Wound Care Plan: Complex Wound Management Notes Patient does have significant deep venous reflux which unfortunately is not treatable. I do worry that possibly she has an obstructive component to this leading to the reflux. I have offered her central venography from a prone left popliteal approach given that this is the left lower extremity and certainly the risk of having a compression with a may Thurner syndrome could be certainly contributing to her issues. Previous CT scan did not really seem to demonstrate any compression issues this was a few years ago. At this time she does not want to schedule venography but will call if she wants to in the future. Electronic Signature(s) Signed: 06/25/2019 2:23:11 PM By: [name], [name], RN, CWS, [name], [name] Signed: 06/25/2019 5:49:14 PM By: Linton Ham MD Entered By: [name], [name], RN, CWS, Kim on 06/25/2019 14:23:11 [name], [name] ([id number]) -------------------------------------------------------------------------------- Encounter Discharge Information Details Patient Name: [name], [name]. Date of Service: 06/24/2019 12:45 PM Medical Record Number: [id number] Patient Account Number: [id number] Date of Birth/Sex: 11/22/1945 (73 y.o. F) Treating RN: Cornell Barman Primary Care Juwaun Inskeep: [name] Other Clinician: Referring Abygayle Deltoro: [name] Treating Kellyanne Ellwanger/Extender: Tito Dine in Treatment: 28 Encounter Discharge Information Items Post Procedure Vitals Discharge Condition: Stable Temperature (F): 98.7 Ambulatory Status: Ambulatory Pulse (bpm): 89 Discharge Destination: Home Respiratory Rate (breaths/min): 16 Transportation: Private Auto Blood Pressure (mmHg): 139/62 Accompanied By: self Schedule Follow-up Appointment: Yes Clinical Summary of Care: Electronic Signature(s) Signed: 06/24/2019 5:55:20  PM By: [name], [name], RN, CWS, [name], [name] Entered By: [name], [name], RN, CWS, Kim on 06/24/2019 13:31:59 [name] ([id number]) -------------------------------------------------------------------------------- Lower Extremity Assessment Details Patient Name: [name], [name]. Date of Service: 06/24/2019 12:45 PM Medical Record Number: [id number] Patient Account Number: [id number] Date of Birth/Sex: 10/02/1946 (73 y.o. F) Treating RN: Montey Hora Primary Care Othello Sgroi: [name] Other Clinician: Referring Karissa Meenan: Danise Mina  JAVIER Treating Mikie Misner/Extender: [name] Dillon Weeks in Treatment: 28 Edema Assessment Assessed: [Left: No] [Right: No] Edema: [Left: Ye] [Right: s] Calf Left: Right: Point of Measurement: 31 cm From Medial Instep 42.7 cm cm Ankle Left: Right: Point of Measurement: 12 cm From Medial Instep 23.2 cm cm Vascular Assessment Pulses: Dorsalis Pedis Palpable: [Left:Yes] Electronic Signature(s) Signed: 06/24/2019 4:23:45 PM By: Montey Hora Entered By: Montey Hora on 06/24/2019 12:51:48 [name], [name] -------------------------------------------------------------------------------- Multi Wound Chart Details Patient Name: [name], [name]. Date of Service: 06/24/2019 12:45 PM Medical Record Number: [id number] Patient Account Number: [id number] Date of Birth/Sex: 02-May-1946 (73 y.o. F) Treating RN: Cornell Barman Primary Care Ciera Beckum: [name] Other Clinician: Referring Terrie Grajales: [name] Treating Kassie Keng/Extender: Tito Dine in Treatment: 28 Vital Signs Height(in): 63 Pulse(bpm): 53 Weight(lbs): 224.7 Blood Pressure(mmHg): 139/62 Body Mass Index(BMI): 40 Temperature(F): 98.7 Respiratory Rate 16 (breaths/min): Photos: [N/A:N/A] Wound Location: Left Lower Leg - Medial Left Lower Leg - Lateral N/A Wounding Event: Gradually Appeared Gradually Appeared N/A Primary Etiology: Lymphedema Venous Leg Ulcer  N/A Comorbid History: Cataracts, Asthma, Sleep Cataracts, Asthma, Sleep N/A Apnea, Deep Vein Apnea, Deep Vein Thrombosis, Hypertension, Thrombosis, Hypertension, Peripheral Venous Disease, Peripheral Venous Disease, Osteoarthritis, Received Osteoarthritis, Received Chemotherapy, Received Chemotherapy, Received Radiation Radiation Date Acquired: 11/19/2018 01/19/2019 N/A Weeks of Treatment: 28 22 N/A Wound Status: Open Open N/A Measurements L x W x D 2.7x2.6x0.2 3.4x3.7x0.3 N/A (cm) Area (cm) : 5.513 9.88 N/A Volume (cm) : 1.103 2.964 N/A % Reduction in Area: 35.50% -4390.90% N/A % Reduction in Volume: -29.00% -13372.70% N/A Classification: Full Thickness Without Full Thickness Without N/A Exposed Support Structures Exposed Support Structures Exudate Amount: Large Large N/A Exudate Type: Purulent Purulent N/A Exudate Color: yellow, brown, green yellow, brown, green N/A Wound Margin: Indistinct, nonvisible Indistinct, nonvisible N/A Granulation Amount: Large (67-100%) Medium (34-66%) N/A Granulation Quality: Pink Pink N/A Necrotic Amount: Small (1-33%) Medium (34-66%) N/A Exposed Structures: N/A RALONDA, SMEDLEY ([id number]) Fat Layer (Subcutaneous Fat Layer (Subcutaneous Tissue) Exposed: Yes Tissue) Exposed: Yes Fascia: No Fascia: No Tendon: No Tendon: No Muscle: No Muscle: No Joint: No Joint: No Bone: No Bone: No Epithelialization: None None N/A Procedures Performed: Cellular or Tissue Based N/A N/A Product Treatment Notes Wound #5 (Left, Medial Lower Leg) Notes Apligraf, abd, 3 layer left unna to anchor Wound #6 (Left, Lateral Lower Leg) Notes Apligraf, abd, 3 layer left unna to anchor Electronic Signature(s) Signed: 06/24/2019 5:33:48 PM By: Linton Ham MD Entered By: Linton Ham on 06/24/2019 16:52:27 Hayton, [name] -------------------------------------------------------------------------------- Multi-Disciplinary Care Plan  Details Patient Name: [name], [name]. Date of Service: 06/24/2019 12:45 PM Medical Record Number: [id number] Patient Account Number: [id number] Date of Birth/Sex: 24-Jul-1946 (73 y.o. F) Treating RN: Cornell Barman Primary Care [name] Cornforth: [name] Other Clinician: Referring Brecklyn Galvis: [name] Treating Amarys Sliwinski/Extender: Tito Dine in Treatment: 28 Active Inactive Orientation to the Wound Care Program Nursing Diagnoses: Knowledge deficit related to the wound healing center program Goals: Patient/caregiver will verbalize understanding of the [facility] Date Initiated: 12/10/2018 Target Resolution Date: 01/09/2019 Goal Status: Active Interventions: Provide education on orientation to the wound center Notes: Soft Tissue Infection Nursing Diagnoses: Impaired tissue integrity Goals: Patient's soft tissue infection will resolve Date Initiated: 12/10/2018 Target Resolution Date: 01/09/2019 Goal Status: Active Interventions: Assess signs and symptoms of infection every visit Notes: Venous Leg Ulcer Nursing Diagnoses: Actual venous Insuffiency (use after diagnosis is confirmed) Goals: Patient will maintain optimal edema control Date Initiated: 12/10/2018 Target Resolution  Date: 01/09/2019 Goal Status: Active Interventions: Assess peripheral edema status every visit. SYMMONE, ELIZARRARAZ ([id number]) Treatment Activities: Therapeutic compression applied : 12/10/2018 Notes: Wound/Skin Impairment Nursing Diagnoses: Impaired tissue integrity Goals: Patient/caregiver will verbalize understanding of skin care regimen Date Initiated: 12/10/2018 Target Resolution Date: 01/09/2019 Goal Status: Active Interventions: Assess ulceration(s) every visit Treatment Activities: Topical wound management initiated : 12/10/2018 Notes: Electronic Signature(s) Signed: 06/24/2019 5:55:20 PM By: [name], [name], RN, CWS, [name], [name] Entered By: [name], [name], RN, CWS, Kim on  06/24/2019 13:16:54 Sagrero, [name] ([id number]) -------------------------------------------------------------------------------- Pain Assessment Details Patient Name: [name], [name]. Date of Service: 06/24/2019 12:45 PM Medical Record Number: [id number] Patient Account Number: [id number] Date of Birth/Sex: 27-Jul-1946 (74 y.o. F) Treating RN: Cornell Barman Primary Care Chaitra Mast: [name] Other Clinician: Referring Haden Cavenaugh: [name] Treating Lenny Fiumara/Extender: Tito Dine in Treatment: 28 Active Problems Location of Pain Severity and Description of Pain Patient Has Paino No Site Locations Pain Management and Medication Current Pain Management: Electronic Signature(s) Signed: 06/24/2019 4:09:28 PM By: Lorine Bears RCP, RRT, CHT Signed: 06/24/2019 5:55:20 PM By: [name], [name], RN, CWS, [name], [name] Entered By: Lorine Bears on 06/24/2019 12:48:48 [name] ([id number]) -------------------------------------------------------------------------------- Patient/Caregiver Education Details Patient Name: [name], [name]. Date of Service: 06/24/2019 12:45 PM Medical Record Number: [id number] Patient Account Number: [id number] Date of Birth/Gender: April 16, 1946 (73 y.o. F) Treating RN: Cornell Barman Primary Care Physician: [name] Other Clinician: Referring Physician: [name] Treating Physician/Extender: Tito Dine in Treatment: 28 Education Assessment Education Provided To: Patient Education Topics Provided Venous: Handouts: Controlling Swelling with Multilayered Compression Wraps Methods: Demonstration, Explain/Verbal Responses: State content correctly Wound/Skin Impairment: Handouts: Caring for Your Ulcer Methods: Demonstration, Explain/Verbal Responses: State content correctly Electronic Signature(s) Signed: 06/24/2019 5:55:20 PM By: [name], [name], RN, CWS, [name], [name] Entered By: [name], [name], RN, CWS,  Kim on 06/24/2019 13:30:30 [name], [name] -------------------------------------------------------------------------------- Wound Assessment Details Patient Name: [name], [name]. Date of Service: 06/24/2019 12:45 PM Medical Record Number: [id number] Patient Account Number: [id number] Date of Birth/Sex: Jun 21, 1946 (73 y.o. F) Treating RN: Montey Hora Primary Care Camille Dragan: [name] Other Clinician: Referring Myrene Bougher: [name] Treating Daphna Lafuente/Extender: Tito Dine in Treatment: 28 Wound Status Wound Number: 5 Primary Lymphedema Etiology: Wound Location: Left Lower Leg - Medial Wound Open Wounding Event: Gradually Appeared Status: Date Acquired: 11/19/2018 Comorbid Cataracts, Asthma, Sleep Apnea, Deep Vein Weeks Of Treatment: 28 History: Thrombosis, Hypertension, Peripheral Venous Clustered Wound: No Disease, Osteoarthritis, Received Chemotherapy, Received Radiation Photos Wound Measurements Length: (cm) 2.7 Width: (cm) 2.6 Depth: (cm) 0.2 Area: (cm) 5.513 Volume: (cm) 1.103 % Reduction in Area: 35.5% % Reduction in Volume: -29% Epithelialization: None Tunneling: No Undermining: No Wound Description Full Thickness Without Exposed Support Classification: Structures Wound Margin: Indistinct, nonvisible Exudate Large Amount: Exudate Type: Purulent Exudate Color: yellow, brown, green Foul Odor After Cleansing: No Slough/Fibrino Yes Wound Bed Granulation Amount: Large (67-100%) Exposed Structure Granulation Quality: Pink Fascia Exposed: No Necrotic Amount: Small (1-33%) Fat Layer (Subcutaneous Tissue) Exposed: Yes Necrotic Quality: Adherent Slough Tendon Exposed: No Muscle Exposed: No Joint Exposed: No Bone Exposed: No Risk, Quantisha J. ([id number]) Treatment Notes Wound #5 (Left, Medial Lower Leg) Notes Apligraf, abd, 3 layer left unna to anchor Electronic Signature(s) Signed: 06/24/2019 4:23:45 PM By: Montey Hora Entered By: Montey Hora on 06/24/2019 12:56:36 Magistro, [name] ([id number]) -------------------------------------------------------------------------------- Wound Assessment Details Patient Name: [name], [name]. Date of Service: 06/24/2019 12:45 PM Medical Record Number: [id number] Patient Account  Number: [id number] Date of Birth/Sex: January 02, 1946 (73 y.o. F) Treating RN: Montey Hora Primary Care Chenelle Benning: [name] Other Clinician: Referring Ilissa Rosner: [name] Treating Jim Lundin/Extender: Tito Dine in Treatment: 28 Wound Status Wound Number: 6 Primary Venous Leg Ulcer Etiology: Wound Location: Left Lower Leg - Lateral Wound Open Wounding Event: Gradually Appeared Status: Date Acquired: 01/19/2019 Comorbid Cataracts, Asthma, Sleep Apnea, Deep Vein Weeks Of Treatment: 22 History: Thrombosis, Hypertension, Peripheral Venous Clustered Wound: No Disease, Osteoarthritis, Received Chemotherapy, Received Radiation Photos Wound Measurements Length: (cm) 3.4 Width: (cm) 3.7 Depth: (cm) 0.3 Area: (cm) 9.88 Volume: (cm) 2.964 % Reduction in Area: -4390.9% % Reduction in Volume: -13372.7% Epithelialization: None Tunneling: No Undermining: No Wound Description Full Thickness Without Exposed Support Classification: Structures Wound Margin: Indistinct, nonvisible Exudate Large Amount: Exudate Type: Purulent Exudate Color: yellow, brown, green Foul Odor After Cleansing: No Slough/Fibrino Yes Wound Bed Granulation Amount: Medium (34-66%) Exposed Structure Granulation Quality: Pink Fascia Exposed: No Necrotic Amount: Medium (34-66%) Fat Layer (Subcutaneous Tissue) Exposed: Yes Necrotic Quality: Adherent Slough Tendon Exposed: No Muscle Exposed: No Joint Exposed: No Bone Exposed: No Noguez, Swan J. ([id number]) Treatment Notes Wound #6 (Left, Lateral Lower Leg) Notes Apligraf, abd, 3 layer left unna to anchor Electronic  Signature(s) Signed: 06/24/2019 4:23:45 PM By: Montey Hora Entered By: Montey Hora on 06/24/2019 12:57:07 Wedemeyer, [name] ([id number]) -------------------------------------------------------------------------------- Vitals Details Patient Name: GUDIYA, VARDEMAN. Date of Service: 06/24/2019 12:45 PM Medical Record Number: [id number] Patient Account Number: [id number] Date of Birth/Sex: 08-28-1946 (73 y.o. F) Treating RN: Cornell Barman Primary Care Eyvonne Burchfield: [name] Other Clinician: Referring Cerise Lieber: [name] Treating Kaila Devries/Extender: Tito Dine in Treatment: 28 Vital Signs Time Taken: 12:45 Temperature (F): 98.7 Height (in): 63 Pulse (bpm): 87 Weight (lbs): 224.7 Respiratory Rate (breaths/min): 16 Body Mass Index (BMI): 39.8 Blood Pressure (mmHg): 139/62 Reference Range: 80 - 120 mg / dl Electronic Signature(s) Signed: 06/24/2019 4:09:28 PM By: Lorine Bears RCP, RRT, CHT Entered By: Lorine Bears on 06/24/2019 12:49:15

## 2019-06-25 NOTE — Progress Notes (Signed)
KHAMIYA, [name] ([id number]) Visit Report for 06/24/2019 Cellular or Tissue Based Product Details Patient Name: [name], [name]. Date of Service: 06/24/2019 12:45 PM Medical Record Number: [id number] Patient Account Number: [id number] Date of Birth/Sex: [birth year] (73 y.o. F) Treating RN: Cornell Barman Primary Care Provider: [name] Other Clinician: Referring Provider: [name] Treating Provider/Extender: Tito Dine in Treatment: 28 Cellular or Tissue Based Wound #5 Left,Medial Lower Leg Product Type Applied to: Performed By: Physician [name] Dillon, MD Cellular or Tissue Based Apligraf Product Type: Level of Consciousness (Pre- Awake and Alert procedure): Pre-procedure Verification/Time Yes - 13:21 Out Taken: Location: trunk / arms / legs Wound Size (sq cm): 7.02 Product Size (sq cm): 44 Waste Size (sq cm): 0 Amount of Product Applied (sq cm): 44 Instrument Used: Blade, Forceps Lot #: [id number] Expiration Date: 06/30/2019 Fenestrated: Yes Instrument: Blade Reconstituted: No Secured: Yes Secured With: Steri-Strips Dressing Applied: Yes Primary Dressing: mepitel one Response to Treatment: Procedure was tolerated well Level of Consciousness (Post- Awake and Alert procedure): Post Procedure Diagnosis Same as Pre-procedure Electronic Signature(s) Signed: 06/24/2019 5:33:48 PM By: Linton Ham MD Entered By: Linton Ham on 06/24/2019 16:52:39 [name], [name] ([id number]) -------------------------------------------------------------------------------- HPI Details Patient Name: [name], [name]. Date of Service: 06/24/2019 12:45 PM Medical Record Number: [id number] Patient Account Number: [id number] Date of Birth/Sex: Jul 11, 1946 (73 y.o. F) Treating RN: Cornell Barman Primary Care Provider: [name] Other Clinician: Referring Provider: [name] Treating Provider/Extender: Tito Dine in Treatment: 28 History  of Present Illness HPI Description: Pleasant 73 year old with history of chronic venous insufficiency. No diabetes or peripheral vascular disease. Left ABI 1.29. Questionable history of left lower extremity DVT. She developed a recurrent ulceration on her left lateral calf in December 2015, which she attributes to poor diet and subsequent lower extremity edema. She underwent endovenous laser ablation of her left greater saphenous vein in 2010. She underwent laser ablation of accessory branch of left GSV in April 2016 by Dr. Kellie Simmering at [facility]. She was previously wearing Unna boots, which she tolerated well. Tolerating 2 layer compression and cadexomer iodine. She returns to clinic for follow-up and is without new complaints. She denies any significant pain at this time. She reports persistent pain with pressure. No claudication or ischemic rest pain. No fever or chills. No drainage. READMISSION 11/13/16; this is a 73 year old woman who is not a diabetic. She is here for a review of a painful area on her left medial lower extremity. I note that she was seen here previously last year for wound I believe to be in the same area. At that time she had undergone previously a left greater saphenous vein ablation by Dr. Kellie Simmering and she had a ablation of the anterior accessory branch of the left greater saphenous vein in March 2016. Seeing that the wound actually closed over. In reviewing the history with her today the ulcer in this area has been recurrent. She describes a biopsy of this area in 2009 that only showed stasis physiology. 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 Do not see that she has ever had arterial studies in the left leg. She tells me she has a follow-up with Dr. Kellie Simmering  in roughly 10 days In any case she developed the reopening  of this area roughly a month ago. On the background of this she describes rapidly increasing edema which has responded to Lasix 40 mg and metolazone 2.5 mg as well as the patient's lymph massage. She has been told she has both venous insufficiency and lymphedema but she cannot tolerate compression stockings 11/28/16; the patient saw Dr. Kellie Simmering recently. Per the patient he did arterial Dopplers in the office that did not show evidence of arterial insufficiency, per the patient he stated "treat this like an ordinary venous ulcer". She also saw her dermatologist Dr. Ronnald Ramp who felt that this was more of a vascular ulcer. In general things are improving although she arrives today with increasing bilateral lower extremity edema with weeping a deeper fluid through the wound on the left medial leg compatible with some degree of lymphedema 12/04/16; the patient's wound is fully epithelialized but I don't think fully healed. We will do another week of depression with Promogran and TCA however I suspect we'll be able to discharge her next week. This is a very unusual-looking wound which was initially a figure-of-eight type wound lying on its side surrounded by petechial like hemorrhage. She has had venous ablation on this side. She apparently does not have an arterial issue per Dr. Kellie Simmering. She saw her dermatologist thought it was "vascular". Patient is definitely going to need ongoing compression and I talked about this with her today she will go to elastic therapy after she leaves here next week 12/11/16; the patient's wound is not completely closed today. She has surrounding scar tissue and in further discussion with the patient it would appear that she had ulcers in this area in 2009 for a prolonged period of time ultimately requiring a punch biopsy of this area that only showed venous insufficiency. I did not previously pickup on this part of the  history from the patient. 12/18/16; the patient's wound is completely epithelialized. There is no open area here. She has significant bilateral venous insufficiency with secondary lymphedema to a mild-to-moderate degree she does not have compression stockings.. She did not say anything to me when I was in the room, she told our intake nurse that she was still having pain in this area. This isn't unusual recurrent small open area. She is going to go to elastic therapy to obtain compression stockings. 12/25/16; the patient's wound is fully epithelialized. There is no open area here. The patient describes some continued episodic discomfort in this area medial left calf. However everything looks fine and healed here. She is been to elastic therapy and caught herself 15-20 mmHg stockings, they apparently were having trouble getting 20-30 mm stockings in her size [name], [name] ([id number]) 01/22/17; this is a patient we discharged from the clinic a month ago. She has a recurrent open wound on her medial left calf. She had 15 mm support stockings. I told her I thought she needed 20-30 mm compression stockings. She tells me that she has been ill with hospitalization secondary to asthma and is been found to have severe hypokalemia likely secondary to a combination of Lasix and metolazone. This morning she noted blistering and leaking fluid on the posterior part of her left leg. She called our intake nurse urgently and we was saw her this afternoon. She has not had any real discomfort here. I don't know that she's been wearing any stockings on this leg for at least 2-3 days. ABIs in this clinic were 1.21 on the right and 1.3 on the left. She is previously  seen vascular surgery who does not think that there is a peripheral arterial issue. 01/30/17; Patient arrives with no open wound on the left leg. She has been to elastic therapy and obtained 20-26mmhg below knee stockings and she has one on the right leg  today. READMISSION 02/19/18; this [place] is a now 73 year old patient we've had in this clinic perhaps 3 times before. I had last looked at her from January 07 December 2016 with an area on the medial left leg. We discharged her on 12/25/16 however she had to be readmitted on 01/22/17 with a recurrence. I have in my notes that we discharged her on 20-30 mm stockings although she tells me she was only wearing support hose because she cannot get stockings on predominantly related to her cervical spine surgery/issues. She has had previous ablations done by vein and vascular in [place] including a great saphenous vein ablation on the left with an anterior accessory branch ablation I think both of these were in 2016. On one of the previous visit she had a biopsy noted 2009 that was negative. She is not felt to have an arterial issue. She is not a diabetic. She does have a history of obstructive sleep apnea hypertension asthma as well as chronic venous insufficiency and lymphedema. On this occasion she noted 2 dry scaly patch on her left leg. She tried to put lotion on this it didn't really help. There were 2 open areas.the patient has been seeing her primary physician from 02/05/18 through 02/14/18. She had Unna boots applied. The superior wound now on the lateral left leg has closed but she's had one wound that remains open on the lateral left leg. This is not the same spot as we dealt with in 2018. ABIs in this clinic were 1.3 bilaterally 02/26/18; patient has a small wound on the left lateral calf. Dimensions are down. She has chronic venous insufficiency and lymphedema. 03/05/18; small open area on the left lateral calf. Dimensions are down. Tightly adherent necrotic debris over the surface of the wound which was difficult to remove. Also the dressing [over collagen] stuck to the wound surface. This was removed with some difficulty as well. Change the primary dressing to Hydrofera Blue ready 03/12/18;  small open area on the left lateral calf. Comes in with tightly adherent surface eschar as well as some adherent Hydrofera Blue. 03/19/18; open area on the left lateral calf. Again adherent surface eschar as well as some adherent Hydrofera Blue nonviable subcutaneous tissue. She complained of pain all week even with the reduction from 4-3 layer compression I put on last week. Also she had an increase in her ankle and calf measurements probably related to the same thing. 03/26/18; open area on the left lateral calf. A very small open area remains here. We used silver alginate starting last week as the Hydrofera Blue seem to stick to the wound bed. In using 4-layer compression 04/02/18; the open area in the left lateral calf at some adherent slough which I removed there is no open area here. We are able to transition her into her own compression stocking. Truthfully I think this is probably his support hose. However this does not maintain skin integrity will be limited. She cannot put over the toe compression stockings on because of neck problems hand problems etc. She is allergic to the lining layer of juxta lites. We might be forced to use extremitease stocking should this fail READMIT 11/24/2018 Patient is now a 73 year old woman who is not  a diabetic. She has been in this clinic on at least 3 previous occasions largely with recurrent wounds on her left leg secondary to chronic venous insufficiency with secondary lymphedema. Her situation is complicated by inability to get stockings on and an allergy to neoprene which is apparently a component and at least juxta lites and other stockings. As a result she really has not been wearing any stockings on her legs. She tells [place] that roughly 2 or 3 weeks ago she started noticing a stinging sensation just above her ankle on the left medial aspect. She has been diagnosed with pseudogout and she wondered whether this was what she was experiencing. She tried to  dress this with something she bought at the store however subsequently it pulled skin off and now she has an open wound that is not improving. She has been using Vaseline gauze with a cover bandage. She saw her primary doctor last week who put an Haematologist on her. ABIs in this clinic was 1.03 on the left [name], [name]. ([id number]) 2/12; the area is on the left medial ankle. Odd-looking wound with what looks to be surface epithelialization but a multitude of small petechial openings. This clearly not closed yet. We have been using silver alginate under 3 layer compression with TCA 2/19; the wound area did not look quite as good this week. Necrotic debris over the majority of the wound surface which required debridement. She continues to have a multitude of what looked to be small petechial openings. She reminds [place] that she had a biopsy on this initially during her first outbreak in 2015 in [place] dermatology. She expresses concern about this being a possible melanoma. 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 I assured her that this does not look anything like melanoma. Except for the petechial reaction it does look like a venous insufficiency area and she certainly has evidence of this on both sides 2/26; a difficult area on the left medial ankle. The patient clearly has chronic venous hypertension with some degree of lymphedema. The odd thing about the area is the small petechial hemorrhages. I am not really sure how to explain this. This was present last time and this is not a compression injury. We have been using Hydrofera Blue which I changed to last week 3/4; still using Hydrofera Blue. Aggressive debridement today. She does not have known arterial issues. She has seen Dr. Kellie Simmering at [facility] vein [facility] vascular and and has an ablation on the left. [Anterior accessory branch of the  greater saphenous]. From what I remember they did not feel she had an arterial issue. The patient has had this area biopsied in 2009 at [facility] dermatology and by her recollection they said this was "stasis". She is also follow-up with dermatology locally who thought that this was more of a vascular issue 3/11; using Hydrofera Blue. Aggressive debridement today. She does not have an arterial issue. We are using 3 layer compression although we may need to go to 4. The patient has been in for multiple changes to her wrap since I last saw her a week ago. She says that the area was leaking. I do not have too much more information on what was found 01/19/19 on evaluation today patient was actually being seen for a nurse visit when unfortunately she had the area on her left lateral lower extremity as well as weeping from the right lower  extremity that became apparent. Therefore we did end up actually seeing her for a full visit with myself. She is having some pain at this site as well but fortunately nothing too significant at this point. No fevers, chills, nausea, or vomiting noted at this time. 3/18-Patient is back to the clinic with the left leg venous leg ulcer, the ulcer is larger in size, has a surface that is densely adherent with fibrinous tissue, the Hydrofera Blue was used but is densely adherent and there was difficulty in removing it. The right lower extremity was also wrapped for weeping edema. Patient has a new area over the left lateral foot above the malleolus that is small and appears to have no debris with intact surrounding skin. Patient is on increased dose of Lasix also as a means to edema management 3/25; the patient has a nonhealing venous ulcer on the medial left leg and last week developed a smaller area on the lateral left calf. We have been using Hydrofera Blue with a contact layer. 4/1; no major change in these wounds areas. Left medial and more recently left lateral calf. I  tried Iodoflex last week to aid in debridement she did not tolerate this. She stated her pain was terrible all week. She took the top layer of the 4 layer compression off. 4/8; the patient actually looks somewhat better in terms of her more prominent left lateral calf wound. There is some healthy looking tissue here. She is still complaining of a lot of discomfort. 4/15; patient in a lot of pain secondary to sciatica. She is on a prednisone taper prescribed by her primary physician. She has the 2 areas one on the left medial and more recently a smaller area on the left lateral calf. Both of these just above the malleoli 4/22; her back pain is better but she still states she is very uncomfortable and now feels she is intolerant to the [employer]. No real change in the wounds we have been using Sorbact. She has been previously intolerant to Iodoflex. There is not a lot of option about what we can use to debride this wound under compression that she no doubt needs. sHe states Ultram no longer works for her pain 4/29; no major change in the wounds slightly increased depth. Surface on the original medial wound perhaps somewhat improved however the more recent area on the lateral left ankle is 100% covered in very adherent debris we have been using Sorbact. She tolerates 4 layer compression well and her edema control is a lot better. She has not had to come in for a nurse check 5/6; no major change in the condition of the wounds. She did consent to debridement today which was done with some difficulty. Continuing Sorbact. She did not tolerate Iodoflex. She was in for a check of her compression the day after we wrapped her last week this was adjusted but nothing much was found 5/13; no major change in the condition or area of the wounds. I was able to get a fairly aggressive debridement done on the lateral left leg wound. Even using Sorbact under compression. She came back in on Friday to have the wrap  changed. She says she felt uncomfortable on the lateral aspect of her ankle. She has a long history of chronic venous insufficiency including previous ablation surgery on this side. 5/20-Patient returns for wounds on left leg with both wounds covered in slough, with the lateral leg wound larger in size, she has been in  3 layer compression and felt more comfortable, she describes pain in ankle, in leg and pins and needles in foot, and is about to try Pamelor for this 6/3; wounds on the left lateral and left medial leg. The area medially which is the most recent of the 2 seems to have had the largest increase in dimensions. We have been using Sorbac to try and debride the surface. She has been to see orthopedics they apparently did a plain x-ray that was indeterminant. Diagnosed her with neuropathy and they have ordered an MRI to [name], [name]. ([id number]) determine if there is underlying osteomyelitis. This was not high on my thought list but I suppose it is prudent. We have advised her to make an appointment with vein and vascular in [place]. She has a history of a left greater saphenous and accessory vein ablations I wonder if there is anything else that can be done from a surgical point of view to help in these difficult refractory wounds. We have previously healed this wound on one occasion but it keeps on reopening [medial side] 6/10; deep tissue culture I did last week I think on the left medial wound showed both moderate E. coli and moderate staph aureus [MSSA]. She is going to require antibiotics and I have chosen Augmentin. We have been using Sorbact and we have made better looking wound surface on both sides but certainly no improvement in wound area. She was back in last Friday apparently for a dressing changes the wrap was hurting her outer left ankle. She has not managed to get a hold of vein and vascular in [place]. We are going to have to make her that appointment 6/17;  patient is tolerating the Augmentin. She had an MRI that I think was ordered by orthopedic surgeon this did not show osteomyelitis or an abscess did suggest cellulitis. We have been using Sorbact to the lateral and medial ankles. We have been trying to arrange a follow-up appointment with vein and vascular in [place] or did her original ablations. We apparently an area sent the request to vein and vascular in [facility] 6/24; patient has completed the Augmentin. We do not yet have a vein and vascular appointment in [place]. I am not sure what the issue is here we have asked her to call tomorrow. We are using Sorbact. Making some improvements and especially the medial wound. Both surfaces however look better medial and lateral. 7/1; the patient has been in contact with vein and vascular in [place] but has not yet received an appointment. Using Sorbact we have gradually improve the wound surface with no improvement in surface area. She is approved for Apligraf but the wound surface still is not completely viable. She has not had to come in for a dressing change 7/8; the patient has an appointment with vein and vascular on 7/31 which is a Friday afternoon. She is concerned about getting back here for [place] to dress her wounds. I think it is important to have them goal for her venous reflux/history of ablations etc. to see if anything else can be done. She apparently tested positive for 1 of the blood tests with regards to lupus and saw a rheumatologist. He has raised the issue of vasculitis again. I have had this thought in the past however the evidence seems overwhelming that this is a venous reflux etiology. If the rheumatologist tells me there is clinical and laboratory investigation is positive for lupus I will rethink this. 7/15; the patient's wound surfaces  are quite a bit better. The medial area which was her original wound now has no depth although the lateral wound which was the more  recent area actually appears larger. Both with viable surfaces which is indeed better. Using Sorbact. I wanted to use Apligraf on her however there is the issue of the vein and vascular appointment on 7/31 at 2:00 in the afternoon which would not allow her to get back to be rewrapped and they would no doubt remove the graft 7/22; the patient's wound surfaces have moderate amount of debris although generally look better. The lateral one is larger with 2 small satellite areas superiorly. We are waiting for her vein and vascular appointment on 7/31. She has been approved for Apligraf which I would like to use after th 7/29; wound surfaces have improved no debridement is required we have been using Sorbact. She sees vein and vascular on Friday with this so question of whether anything can be done to lessen the likelihood of recurrence and/or speed the healing of these areas. She is already had previous ablations. She no doubt has severe venous hypertension 8/5-Patient returns at 1 week, she was in [place] for 3 days by her podiatrist, we have been using so backed to the wound, she has increased pain in both the wounds on the left lower leg especially the more distal one on the lateral aspect 8/12-Patient returns at 1 week and she is agreeable to having debridement in both wounds on her left leg today. We have been using Sorbact, and vascular studies were reviewed at last visit 8/19; the patient arrives with her wounds fairly clean and no debridement is required. We have used Sorbact which is really done a nice job in cleaning up these very difficult wound surfaces. The patient saw Dr. Donzetta Matters of vascular surgery on 7/31. He did not feel that there was an arterial component. He felt that her treated greater saphenous vein is adequately addressed and that the small saphenous vein did not appear to be involved significantly. She was also noted to have deep venous reflux which is not treatable. Dr. Donzetta Matters  mentioned the possibility of a central obstructive component leading to reflux and he offered her central venography. She wanted to discuss this or think about it. I have urged her to go ahead with this. She has had recurrent difficult wounds in these areas which do heal but after months in the clinic. If there is anything that can be done to reduce the likelihood of this I think it is worth it. Electronic Signature(s) Signed: 06/24/2019 5:33:48 PM By: Linton Ham MD Entered By: Linton Ham on 06/24/2019 16:55:30 Houchins, [name] ([id number]) -------------------------------------------------------------------------------- Physical Exam Details Patient Name: MIKAYAH, JOY. Date of Service: 06/24/2019 12:45 PM Medical Record Number: [id number] Patient Account Number: [id number] Date of Birth/Sex: 1946-08-26 (73 y.o. F) Treating RN: Cornell Barman Primary Care Provider: [name] Other Clinician: Referring Provider: [name] Treating Provider/Extender: Tito Dine in Treatment: 28 Notes Wound exams; both wounds are with a lot better surface than when I last saw these. No mechanical debridement was required. I did wash off some white coating with saline and gauze. Apligraf #1 applied in the standard fashion Electronic Signature(s) Signed: 06/24/2019 5:33:48 PM By: Linton Ham MD Entered By: Linton Ham on 06/24/2019 17:02:30 Diss, [name] ([id number]) -------------------------------------------------------------------------------- Physician Orders Details Patient Name: KHAYA, THEISSEN. Date of Service: 06/24/2019 12:45 PM Medical Record Number: [id number] Patient Account Number: [id number] Date of Birth/Sex:  Apr 18, 1946 (73 y.o. F) Treating RN: Cornell Barman Primary Care Provider: [name] Other Clinician: Referring Provider: [name] Treating Provider/Extender: Tito Dine in Treatment: 28 Verbal / Phone Orders:  No Diagnosis Coding Wound Cleansing Wound #5 Left,Medial Lower Leg o Clean wound with Normal Saline. o May Shower, gently pat wound dry prior to applying new dressing. Wound #6 Left,Lateral Lower Leg o Clean wound with Normal Saline. o May Shower, gently pat wound dry prior to applying new dressing. Anesthetic (add to Medication List) Wound #5 Left,Medial Lower Leg o Topical Lidocaine 4% cream applied to wound bed prior to debridement (In Clinic Only). Wound #6 Left,Lateral Lower Leg o Topical Lidocaine 4% cream applied to wound bed prior to debridement (In Clinic Only). Skin Barriers/Peri-Wound Care Wound #5 Left,Medial Lower Leg o Barrier cream - Zinc oxide o Other: - Antibiotic ointment on irritated areas Wound #6 Left,Lateral Lower Leg o Barrier cream - Zinc oxide o Other: - Antibiotic ointment on irritated areas Dressing Change Frequency Wound #5 Left,Medial Lower Leg o Change dressing every week Wound #6 Left,Lateral Lower Leg o Change dressing every week Follow-up Appointments Wound #5 Left,Medial Lower Leg o Return Appointment in 1 week. o Nurse Visit as needed Wound #6 Left,Lateral Lower Leg o Return Appointment in 1 week. o Nurse Visit as needed Edema Control Wound #5 Left,Medial Lower Leg [name], [name] ([id number]) o 3 Layer Compression System - Left Lower Extremity Wound #6 Left,Lateral Lower Leg o 3 Layer Compression System - Left Lower Extremity Off-Loading Wound #5 Left,Medial Lower Leg o Other: - Elevate legs as needed Wound #6 Left,Lateral Lower Leg o Other: - Elevate legs as needed Advanced Therapies Wound #5 Left,Medial Lower Leg o Apligraf application in clinic; including contact layer, fixation with steri strips, dry gauze and cover dressing. Wound #6 Left,Lateral Lower Leg o Apligraf application in clinic; including contact layer, fixation with steri strips, dry gauze and cover dressing. Patient  Medications Allergies: Sulfa (Sulfonamide Antibiotics), latex, Neoprene Notifications Medication Indication Start End lidocaine DOSE topical 4 % cream - cream topical Electronic Signature(s) Signed: 06/24/2019 5:33:48 PM By: Linton Ham MD Signed: 06/24/2019 5:55:20 PM By: [name], [name], RN, CWS, [name], [name] Entered By: [name], [name], RN, CWS, Kim on 06/24/2019 13:27:00 [name], BENNETTS ([id number]) -------------------------------------------------------------------------------- Prescription 06/24/2019 Patient Name: [name] Provider: [name] Dillon MD Date of Birth: September 12, 1946 NPI#: [id number] Sex: F DEA#: [id number] Phone #: [phone] License #: [id number] Patient Address: [facility] [place], [place] [address] [address] [place], [place] [address] [phone] Allergies Sulfa (Sulfonamide Antibiotics) Reaction: facial swelling latex Neoprene Medication Medication: Route: Strength: Form: lidocaine topical 4% cream Class: TOPICAL LOCAL ANESTHETICS Dose: Frequency / Time: Indication: cream topical Number of Refills: Number of Units: 0 Generic Substitution: Start Date: End Date: Administered at Substitution Permitted Facility: Yes Time Administered: Time Discontinued: Note to Pharmacy: Signature(s): Date(s): LAMARIA, HILDEBRANDT ([id number]) Electronic Signature(s) Signed: 06/24/2019 5:33:48 PM By: Linton Ham MD Signed: 06/24/2019 5:55:20 PM By: [name], [name], RN, CWS, [name], [name] Entered By: [name], [name], RN, CWS, Kim on 06/24/2019 13:27:01 FARRAH, SKODA ([id number]) --------------------------------------------------------------------------------  Problem List Details Patient Name: ANAPAULA, SEVERT. Date of Service: 06/24/2019 12:45 PM Medical Record Number: [id number] Patient Account Number: [id number] Date of Birth/Sex: 20-[name]-1946 (73 y.o. F) Treating RN: Cornell Barman Primary Care Provider: [name] Other Clinician: Referring Provider: [name] Treating Provider/Extender: Tito Dine in  Treatment: 28 Active Problems ICD-10 Evaluated Encounter Code Description Active Date Today Diagnosis L97.221 Non-pressure chronic ulcer of left calf limited to breakdown of 01/07/2019 No Yes skin I87.321 Chronic venous hypertension (idiopathic) with inflammation of 12/10/2018 No Yes right lower extremity I89.0 Lymphedema, not elsewhere classified 12/10/2018 No Yes Inactive Problems Resolved Problems Electronic Signature(s) Signed: 06/24/2019 5:33:48 PM By: Linton Ham MD Entered By: Linton Ham on 06/24/2019 16:52:16 Philbert, [name] ([id number]) -------------------------------------------------------------------------------- Progress Note Details Patient Name: [name], [name]. Date of Service: 06/24/2019 12:45 PM Medical Record Number: [id number] Patient Account Number: [id number] Date of Birth/Sex: [birth year] (73 y.o. F) Treating RN: Cornell Barman Primary Care Provider: [name] Other Clinician: Referring Provider: [name] Treating Provider/Extender: Tito Dine in Treatment: 28 Subjective History of Present Illness (HPI) Pleasant 73 year old with history of chronic venous insufficiency. No diabetes or peripheral vascular disease. Left ABI 1.29. Questionable history of left lower extremity DVT. She developed a recurrent ulceration on her left lateral calf in December 2015, which she attributes to poor diet and subsequent lower extremity edema. She underwent endovenous laser ablation of her left greater saphenous vein in 2010. She underwent laser ablation of accessory branch of left GSV in April 2016 by Dr. Kellie Simmering at [facility]. She was previously wearing Unna boots, which she tolerated well. Tolerating 2 layer compression and cadexomer iodine. She returns to clinic for follow-up and is without new  complaints. She denies any significant pain at this time. She reports persistent pain with pressure. No claudication or ischemic rest pain. No fever or chills. No drainage. READMISSION 11/13/16; this is a 73 year old woman who is not a diabetic. She is here for a review of a painful area on her left medial lower extremity. I note that she was seen here previously last year for wound I believe to be in the same area. At that time she had undergone previously a left greater saphenous vein ablation by Dr. Kellie Simmering and she had a ablation of the anterior accessory branch of the left greater saphenous vein in March 2016. Seeing that the wound actually closed over. In reviewing the history with her today the ulcer in this area has been recurrent. She describes a biopsy of this area in 2009 that only showed stasis physiology. 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 Do not see that she has ever had arterial studies in the left leg. She tells me she has a follow-up with Dr. Kellie Simmering in roughly 10 days In any case she developed the reopening of this area roughly a month ago. On the background of this she describes rapidly increasing edema which has responded to Lasix 40 mg and metolazone 2.5 mg as well as the patient's lymph massage. She has been told she has both venous insufficiency and lymphedema but she cannot tolerate compression stockings 11/28/16; the patient saw Dr. Kellie Simmering recently. Per the patient he did arterial Dopplers in the office that did not show evidence of arterial insufficiency, per the patient he stated "treat this like an ordinary venous ulcer". She also saw her dermatologist Dr. Ronnald Ramp who felt that this was more of a vascular ulcer. In general things are improving although  she arrives today with increasing bilateral lower extremity edema with weeping a deeper fluid through the  wound on the left medial leg compatible with some degree of lymphedema 12/04/16; the patient's wound is fully epithelialized but I don't think fully healed. We will do another week of depression with Promogran and TCA however I suspect we'll be able to discharge her next week. This is a very unusual-looking wound which was initially a figure-of-eight type wound lying on its side surrounded by petechial like hemorrhage. She has had venous ablation on this side. She apparently does not have an arterial issue per Dr. Kellie Simmering. She saw her dermatologist thought it was "vascular". Patient is definitely going to need ongoing compression and I talked about this with her today she will go to elastic therapy after she leaves here next week 12/11/16; the patient's wound is not completely closed today. She has surrounding scar tissue and in further discussion with the patient it would appear that she had ulcers in this area in 2009 for a prolonged period of time ultimately requiring a punch biopsy of this area that only showed venous insufficiency. I did not previously pickup on this part of the history from the patient. 12/18/16; the patient's wound is completely epithelialized. There is no open area here. She has significant bilateral venous insufficiency with secondary lymphedema to a mild-to-moderate degree she does not have compression stockings.. She did not say anything to me when I was in the room, she told our intake nurse that she was still having pain in this area. This isn't unusual recurrent small open area. She is going to go to elastic therapy to obtain compression stockings. 12/25/16; the patient's wound is fully epithelialized. There is no open area here. The patient describes some continued episodic discomfort in this area medial left calf. However everything looks fine and healed here. She is  been to elastic therapy and BRAELYN, JENSON ([id number]) caught herself 15-20 mmHg stockings, they apparently were having trouble getting 20-30 mm stockings in her size 01/22/17; this is a patient we discharged from the clinic a month ago. She has a recurrent open wound on her medial left calf. She had 15 mm support stockings. I told her I thought she needed 20-30 mm compression stockings. She tells me that she has been ill with hospitalization secondary to asthma and is been found to have severe hypokalemia likely secondary to a combination of Lasix and metolazone. This morning she noted blistering and leaking fluid on the posterior part of her left leg. She called our intake nurse urgently and we was saw her this afternoon. She has not had any real discomfort here. I don't know that she's been wearing any stockings on this leg for at least 2-3 days. ABIs in this clinic were 1.21 on the right and 1.3 on the left. She is previously seen vascular surgery who does not think that there is a peripheral arterial issue. 01/30/17; Patient arrives with no open wound on the left leg. She has been to elastic therapy and obtained 20-67mmhg below knee stockings and she has one on the right leg today. READMISSION 02/19/18; this Spurgin is a now 73 year old patient we've had in this clinic perhaps 3 times before. I had last looked at her from January 07 December 2016 with an area on the medial left leg. We discharged her on 12/25/16 however she had to be readmitted on 01/22/17 with a recurrence. I have in my notes that we discharged her on 20-30 mm stockings although she tells me she was only wearing support hose because she cannot get stockings on  predominantly related to her cervical spine surgery/issues. She has had previous ablations done by vein and vascular in [place] including a great saphenous vein ablation on the left with an anterior accessory branch ablation I think both of these were in 2016. On one of  the previous visit she had a biopsy noted 2009 that was negative. She is not felt to have an arterial issue. She is not a diabetic. She does have a history of obstructive sleep apnea hypertension asthma as well as chronic venous insufficiency and lymphedema. On this occasion she noted 2 dry scaly patch on her left leg. She tried to put lotion on this it didn't really help. There were 2 open areas.the patient has been seeing her primary physician from 02/05/18 through 02/14/18. She had Unna boots applied. The superior wound now on the lateral left leg has closed but she's had one wound that remains open on the lateral left leg. This is not the same spot as we dealt with in 2018. ABIs in this clinic were 1.3 bilaterally 02/26/18; patient has a small wound on the left lateral calf. Dimensions are down. She has chronic venous insufficiency and lymphedema. 03/05/18; small open area on the left lateral calf. Dimensions are down. Tightly adherent necrotic debris over the surface of the wound which was difficult to remove. Also the dressing [over collagen] stuck to the wound surface. This was removed with some difficulty as well. Change the primary dressing to Hydrofera Blue ready 03/12/18; small open area on the left lateral calf. Comes in with tightly adherent surface eschar as well as some adherent Hydrofera Blue. 03/19/18; open area on the left lateral calf. Again adherent surface eschar as well as some adherent Hydrofera Blue nonviable subcutaneous tissue. She complained of pain all week even with the reduction from 4-3 layer compression I put on last week. Also she had an increase in her ankle and calf measurements probably related to the same thing. 03/26/18; open area on the left lateral calf. A very small open area remains here. We used silver alginate starting last week as the Hydrofera Blue seem to stick to the wound bed. In using 4-layer compression 04/02/18; the open area in the left lateral calf at  some adherent slough which I removed there is no open area here. We are able to transition her into her own compression stocking. Truthfully I think this is probably his support hose. However this does not maintain skin integrity will be limited. She cannot put over the toe compression stockings on because of neck problems hand problems etc. She is allergic to the lining layer of juxta lites. We might be forced to use extremitease stocking should this fail READMIT 11/24/2018 Patient is now a 73 year old woman who is not a diabetic. She has been in this clinic on at least 3 previous occasions largely with recurrent wounds on her left leg secondary to chronic venous insufficiency with secondary lymphedema. Her situation is complicated by inability to get stockings on and an allergy to neoprene which is apparently a component and at least juxta lites and other stockings. As a result she really has not been wearing any stockings on her legs. She tells [place] that roughly 2 or 3 weeks ago she started noticing a stinging sensation just above her ankle on the left medial aspect. She has been diagnosed with pseudogout and she wondered whether this was what she was experiencing. She tried to dress this with something she bought at the store however subsequently it  pulled skin off and now she has an open wound that is not improving. She has been using Vaseline gauze with a cover bandage. She saw her primary doctor last week who put an Haematologist on her. ABIs in this clinic was 1.03 on the left BRYNLIE, DAZA. ([id number]) 2/12; the area is on the left medial ankle. Odd-looking wound with what looks to be surface epithelialization but a multitude of small petechial openings. This clearly not closed yet. We have been using silver alginate under 3 layer compression with TCA 2/19; the wound area did not look quite as good this week. Necrotic debris over the majority of the wound surface which required debridement.  She continues to have a multitude of what looked to be small petechial openings. She reminds [place] that she had a biopsy on this initially during her first outbreak in 2015 in [place] dermatology. She expresses concern about this being a possible melanoma. 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 I assured her that this does not look anything like melanoma. Except for the petechial reaction it does look like a venous insufficiency area and she certainly has evidence of this on both sides 2/26; a difficult area on the left medial ankle. The patient clearly has chronic venous hypertension with some degree of lymphedema. The odd thing about the area is the small petechial hemorrhages. I am not really sure how to explain this. This was present last time and this is not a compression injury. We have been using Hydrofera Blue which I changed to last week 3/4; still using Hydrofera Blue. Aggressive debridement today. She does not have known arterial issues. She has seen Dr. Kellie Simmering at [facility] vein [facility] vascular and and has an ablation on the left. [Anterior accessory branch of the greater saphenous]. From what I remember they did not feel she had an arterial issue. The patient has had this area biopsied in 2009 at [facility] dermatology and by her recollection they said this was "stasis". She is also follow-up with dermatology locally who thought that this was more of a vascular issue 3/11; using Hydrofera Blue. Aggressive debridement today. She does not have an arterial issue. We are using 3 layer compression although we may need to go to 4. The patient has been in for multiple changes to her wrap since I last saw her a week ago. She says that the area was leaking. I do not have too much more information on what was found 01/19/19 on evaluation today patient was actually being seen for a nurse visit when unfortunately  she had the area on her left lateral lower extremity as well as weeping from the right lower extremity that became apparent. Therefore we did end up actually seeing her for a full visit with myself. She is having some pain at this site as well but fortunately nothing too significant at this point. No fevers, chills, nausea, or vomiting noted at this time. 3/18-Patient is back to the clinic with the left leg venous leg ulcer, the ulcer is larger in size, has a surface that is densely adherent with fibrinous tissue, the Hydrofera Blue was used but is densely adherent and there was difficulty in removing it. The right lower extremity was also wrapped for weeping edema. Patient has a new area over the left lateral foot above the malleolus that is small and appears to have no debris with intact surrounding skin. Patient  is on increased dose of Lasix also as a means to edema management 3/25; the patient has a nonhealing venous ulcer on the medial left leg and last week developed a smaller area on the lateral left calf. We have been using Hydrofera Blue with a contact layer. 4/1; no major change in these wounds areas. Left medial and more recently left lateral calf. I tried Iodoflex last week to aid in debridement she did not tolerate this. She stated her pain was terrible all week. She took the top layer of the 4 layer compression off. 4/8; the patient actually looks somewhat better in terms of her more prominent left lateral calf wound. There is some healthy looking tissue here. She is still complaining of a lot of discomfort. 4/15; patient in a lot of pain secondary to sciatica. She is on a prednisone taper prescribed by her primary physician. She has the 2 areas one on the left medial and more recently a smaller area on the left lateral calf. Both of these just above the malleoli 4/22; her back pain is better but she still states she is very uncomfortable and now feels she is intolerant to the [employer]. No real change in the wounds we have been using Sorbact. She has been previously intolerant to Iodoflex. There is not a lot of option about what we can use to debride this wound under compression that she no doubt needs. sHe states Ultram no longer works for her pain 4/29; no major change in the wounds slightly increased depth. Surface on the original medial wound perhaps somewhat improved however the more recent area on the lateral left ankle is 100% covered in very adherent debris we have been using Sorbact. She tolerates 4 layer compression well and her edema control is a lot better. She has not had to come in for a nurse check 5/6; no major change in the condition of the wounds. She did consent to debridement today which was done with some difficulty. Continuing Sorbact. She did not tolerate Iodoflex. She was in for a check of her compression the day after we wrapped her last week this was adjusted but nothing much was found 5/13; no major change in the condition or area of the wounds. I was able to get a fairly aggressive debridement done on the lateral left leg wound. Even using Sorbact under compression. She came back in on Friday to have the wrap changed. She says she felt uncomfortable on the lateral aspect of her ankle. She has a long history of chronic venous insufficiency including previous ablation surgery on this side. 5/20-Patient returns for wounds on left leg with both wounds covered in slough, with the lateral leg wound larger in size, she has been in 3 layer compression and felt more comfortable, she describes pain in ankle, in leg and pins and needles in foot, and is about to try Pamelor for this 6/3; wounds on the left lateral and left medial leg. The area medially which is the most recent of the 2 seems to have had the largest increase in dimensions. We have been using Sorbac to try and debride the surface. She has been to see orthopedics [name], [name]  ([id number]) they apparently did a plain x-ray that was indeterminant. Diagnosed her with neuropathy and they have ordered an MRI to determine if there is underlying osteomyelitis. This was not high on my thought list but I suppose it is prudent. We have advised her to make an appointment with  vein and vascular in [place]. She has a history of a left greater saphenous and accessory vein ablations I wonder if there is anything else that can be done from a surgical point of view to help in these difficult refractory wounds. We have previously healed this wound on one occasion but it keeps on reopening [medial side] 6/10; deep tissue culture I did last week I think on the left medial wound showed both moderate E. coli and moderate staph aureus [MSSA]. She is going to require antibiotics and I have chosen Augmentin. We have been using Sorbact and we have made better looking wound surface on both sides but certainly no improvement in wound area. She was back in last Friday apparently for a dressing changes the wrap was hurting her outer left ankle. She has not managed to get a hold of vein and vascular in [place]. We are going to have to make her that appointment 6/17; patient is tolerating the Augmentin. She had an MRI that I think was ordered by orthopedic surgeon this did not show osteomyelitis or an abscess did suggest cellulitis. We have been using Sorbact to the lateral and medial ankles. We have been trying to arrange a follow-up appointment with vein and vascular in [place] or did her original ablations. We apparently an area sent the request to vein and vascular in [facility] 6/24; patient has completed the Augmentin. We do not yet have a vein and vascular appointment in [place]. I am not sure what the issue is here we have asked her to call tomorrow. We are using Sorbact. Making some improvements and especially the medial wound. Both surfaces however look better medial and  lateral. 7/1; the patient has been in contact with vein and vascular in [place] but has not yet received an appointment. Using Sorbact we have gradually improve the wound surface with no improvement in surface area. She is approved for Apligraf but the wound surface still is not completely viable. She has not had to come in for a dressing change 7/8; the patient has an appointment with vein and vascular on 7/31 which is a Friday afternoon. She is concerned about getting back here for [place] to dress her wounds. I think it is important to have them goal for her venous reflux/history of ablations etc. to see if anything else can be done. She apparently tested positive for 1 of the blood tests with regards to lupus and saw a rheumatologist. He has raised the issue of vasculitis again. I have had this thought in the past however the evidence seems overwhelming that this is a venous reflux etiology. If the rheumatologist tells me there is clinical and laboratory investigation is positive for lupus I will rethink this. 7/15; the patient's wound surfaces are quite a bit better. The medial area which was her original wound now has no depth although the lateral wound which was the more recent area actually appears larger. Both with viable surfaces which is indeed better. Using Sorbact. I wanted to use Apligraf on her however there is the issue of the vein and vascular appointment on 7/31 at 2:00 in the afternoon which would not allow her to get back to be rewrapped and they would no doubt remove the graft 7/22; the patient's wound surfaces have moderate amount of debris although generally look better. The lateral one is larger with 2 small satellite areas superiorly. We are waiting for her vein and vascular appointment on 7/31. She has been approved for Apligraf which I  would like to use after th 7/29; wound surfaces have improved no debridement is required we have been using Sorbact. She sees vein and  vascular on Friday with this so question of whether anything can be done to lessen the likelihood of recurrence and/or speed the healing of these areas. She is already had previous ablations. She no doubt has severe venous hypertension 8/5-Patient returns at 1 week, she was in [place] for 3 days by her podiatrist, we have been using so backed to the wound, she has increased pain in both the wounds on the left lower leg especially the more distal one on the lateral aspect 8/12-Patient returns at 1 week and she is agreeable to having debridement in both wounds on her left leg today. We have been using Sorbact, and vascular studies were reviewed at last visit 8/19; the patient arrives with her wounds fairly clean and no debridement is required. We have used Sorbact which is really done a nice job in cleaning up these very difficult wound surfaces. The patient saw Dr. Donzetta Matters of vascular surgery on 7/31. He did not feel that there was an arterial component. He felt that her treated greater saphenous vein is adequately addressed and that the small saphenous vein did not appear to be involved significantly. She was also noted to have deep venous reflux which is not treatable. Dr. Donzetta Matters mentioned the possibility of a central obstructive component leading to reflux and he offered her central venography. She wanted to discuss this or think about it. I have urged her to go ahead with this. She has had recurrent difficult wounds in these areas which do heal but after months in the clinic. If there is anything that can be done to reduce the likelihood of this I think it is worth it. Objective [name], [name] ([id number]) Constitutional Vitals Time Taken: 12:45 PM, Height: 63 in, Weight: 224.7 lbs, BMI: 39.8, Temperature: 98.7 F, Pulse: 87 bpm, Respiratory Rate: 16 breaths/min, Blood Pressure: 139/62 mmHg. Integumentary (Hair, Skin) Wound #5 status is Open. Original cause of wound was Gradually Appeared. The  wound is located on the Left,Medial Lower Leg. The wound measures 2.7cm length x 2.6cm width x 0.2cm depth; 5.513cm^2 area and 1.103cm^3 volume. There is Fat Layer (Subcutaneous Tissue) Exposed exposed. There is no tunneling or undermining noted. There is a large amount of purulent drainage noted. The wound margin is indistinct and nonvisible. There is large (67-100%) pink granulation within the wound bed. There is a small (1-33%) amount of necrotic tissue within the wound bed including Adherent Slough. Wound #6 status is Open. Original cause of wound was Gradually Appeared. The wound is located on the Left,Lateral Lower Leg. The wound measures 3.4cm length x 3.7cm width x 0.3cm depth; 9.88cm^2 area and 2.964cm^3 volume. There is Fat Layer (Subcutaneous Tissue) Exposed exposed. There is no tunneling or undermining noted. There is a large amount of purulent drainage noted. The wound margin is indistinct and nonvisible. There is medium (34-66%) pink granulation within the wound bed. There is a medium (34-66%) amount of necrotic tissue within the wound bed including Adherent Slough. Assessment Active Problems ICD-10 Non-pressure chronic ulcer of left calf limited to breakdown of skin Chronic venous hypertension (idiopathic) with inflammation of right lower extremity Lymphedema, not elsewhere classified Procedures Wound #5 Pre-procedure diagnosis of Wound #5 is a Lymphedema located on the Left,Medial Lower Leg. A skin graft procedure using a bioengineered skin substitute/cellular or tissue based product was performed by [name] Dillon, MD  with the following instrument(s): Blade and Forceps. Apligraf was applied and secured with Steri-Strips. 44 sq cm of product was utilized and 0 sq cm was wasted. Post Application, mepitel one was applied. A Time Out was conducted at 13:21, prior to the start of the procedure. The procedure was tolerated well. Post procedure Diagnosis Wound #5: Same as  Pre-Procedure . Plan Wound Cleansing: Wound #5 Left,Medial Lower Leg: Clean wound with Normal Saline. May Shower, gently pat wound dry prior to applying new dressing. Wound #6 Left,Lateral Lower Leg: [name], [name] ([id number]) Clean wound with Normal Saline. May Shower, gently pat wound dry prior to applying new dressing. Anesthetic (add to Medication List): Wound #5 Left,Medial Lower Leg: Topical Lidocaine 4% cream applied to wound bed prior to debridement (In Clinic Only). Wound #6 Left,Lateral Lower Leg: Topical Lidocaine 4% cream applied to wound bed prior to debridement (In Clinic Only). Skin Barriers/Peri-Wound Care: Wound #5 Left,Medial Lower Leg: Barrier cream - Zinc oxide Other: - Antibiotic ointment on irritated areas Wound #6 Left,Lateral Lower Leg: Barrier cream - Zinc oxide Other: - Antibiotic ointment on irritated areas Dressing Change Frequency: Wound #5 Left,Medial Lower Leg: Change dressing every week Wound #6 Left,Lateral Lower Leg: Change dressing every week Follow-up Appointments: Wound #5 Left,Medial Lower Leg: Return Appointment in 1 week. Nurse Visit as needed Wound #6 Left,Lateral Lower Leg: Return Appointment in 1 week. Nurse Visit as needed Edema Control: Wound #5 Left,Medial Lower Leg: 3 Layer Compression System - Left Lower Extremity Wound #6 Left,Lateral Lower Leg: 3 Layer Compression System - Left Lower Extremity Off-Loading: Wound #5 Left,Medial Lower Leg: Other: - Elevate legs as needed Wound #6 Left,Lateral Lower Leg: Other: - Elevate legs as needed Advanced Therapies: Wound #5 Left,Medial Lower Leg: Apligraf application in clinic; including contact layer, fixation with steri strips, dry gauze and cover dressing. Wound #6 Left,Lateral Lower Leg: Apligraf application in clinic; including contact layer, fixation with steri strips, dry gauze and cover dressing. The following medication(s) was prescribed: lidocaine topical 4 % cream  cream topical was prescribed at facility 1. I think the Sorbact did a good job in debriding these wounds surfaces 2. We applied Apligraf #1 today 3. I have encouraged her to go ahead with the venogram with Dr. Donzetta Matters suggested this. That way if these recur at least we know that everything has been done that can be done Electronic Signature(s) Signed: 06/24/2019 5:03:38 PM By: Linton Ham MD Entered By: Linton Ham on 06/24/2019 17:03:37 Schellenberg, [name] ([id number]) -------------------------------------------------------------------------------- SuperBill Details Patient Name: [name], [name]. Date of Service: 06/24/2019 Medical Record Number: [id number] Patient Account Number: [id number] Date of Birth/Sex: [birth year] (73 y.o. F) Treating RN: Cornell Barman Primary Care Provider: [name] Other Clinician: Referring Provider: [name] Treating Provider/Extender: Tito Dine in Treatment: 28 Diagnosis Coding ICD-10 Codes Code Description [phone] Non-pressure chronic ulcer of left calf limited to breakdown of skin I87.321 Chronic venous hypertension (idiopathic) with inflammation of right lower extremity I89.0 Lymphedema, not elsewhere classified Facility Procedures CPT4 Code Description: [id number] (Facility Use Only) Apligraf 44 SQ CM Modifier: Quantity: 7 CPT4 Code Description: [id number] 15271 - SKIN SUB GRAFT TRNK/ARM/LEG ICD-10 Diagnosis Description L97.221 Non-pressure chronic ulcer of left calf limited to breakdown o I87.321 Chronic venous hypertension (idiopathic) with inflammation of Modifier: f skin right lower ext Quantity: 1 remity Physician Procedures CPT4 Code Description: [id number] - WC PHYS SKIN SUB GRAFT TRNK/ARM/LEG ICD-10 Diagnosis Description L97.221 Non-pressure chronic ulcer of left calf limited to breakdown of  F12.197 Chronic venous hypertension (idiopathic) with inflammation of r Modifier: skin ight lower extr Quantity: 1  emity Electronic Signature(s) Signed: 06/24/2019 5:33:48 PM By: Linton Ham MD Entered By: Linton Ham on 06/24/2019 17:01:24

## 2019-07-01 ENCOUNTER — Other Ambulatory Visit: Payer: Self-pay

## 2019-07-01 DIAGNOSIS — E876 Hypokalemia: Secondary | ICD-10-CM | POA: Diagnosis not present

## 2019-07-01 DIAGNOSIS — I890 Lymphedema, not elsewhere classified: Secondary | ICD-10-CM | POA: Diagnosis not present

## 2019-07-01 DIAGNOSIS — G4733 Obstructive sleep apnea (adult) (pediatric): Secondary | ICD-10-CM | POA: Diagnosis not present

## 2019-07-01 DIAGNOSIS — G629 Polyneuropathy, unspecified: Secondary | ICD-10-CM | POA: Diagnosis not present

## 2019-07-01 DIAGNOSIS — I10 Essential (primary) hypertension: Secondary | ICD-10-CM | POA: Diagnosis not present

## 2019-07-01 DIAGNOSIS — L97221 Non-pressure chronic ulcer of left calf limited to breakdown of skin: Secondary | ICD-10-CM | POA: Diagnosis not present

## 2019-07-01 NOTE — Progress Notes (Signed)
[name] Visit Report for 07/01/2019 Arrival Information Details Patient Name: [name], [name]. Date of Service: 07/01/2019 12:45 PM Medical Record Number: [id number] Patient Account Number: [id number] Date of Birth/Sex: 07-08-1946 (73 y.o. F) Treating RN: Montey Hora Primary Care Ema Hebner: [name] Other Clinician: Referring Socorro Kanitz: [name] Treating Lillian Tigges/Extender: Tito Dine in Treatment: 54 Visit Information History Since Last Visit Added or deleted any medications: No Patient Arrived: Ambulatory Any new allergies or adverse reactions: No Arrival Time: 12:42 Had a fall or experienced change in No Accompanied By: self activities of daily living that may affect Transfer Assistance: None risk of falls: Patient Identification Verified: Yes Signs or symptoms of abuse/neglect since last visito No Secondary Verification Process Completed: Yes Hospitalized since last visit: No Implantable device outside of the clinic excluding No cellular tissue based products placed in the center since last visit: Has Dressing in Place as Prescribed: Yes Has Compression in Place as Prescribed: Yes Pain Present Now: No Electronic Signature(s) Signed: 07/01/2019 4:31:31 PM By: Montey Hora Entered By: Montey Hora on 07/01/2019 12:43:31 [name], [name] -------------------------------------------------------------------------------- Compression Therapy Details Patient Name: [name], [name]. Date of Service: 07/01/2019 12:45 PM Medical Record Number: [id number] Patient Account Number: [id number] Date of Birth/Sex: [birth year] (73 y.o. F) Treating RN: Montey Hora Primary Care Symphony Demuro: [name] Other Clinician: Referring Cadey Bazile: [name] Treating Dvante Hands/Extender: Tito Dine in Treatment: 29 Compression Therapy Performed for Wound Assessment: Wound #5 Left,Medial Lower Leg Performed By:  Clinician Montey Hora, RN Compression Type: Three Layer Electronic Signature(s) Signed: 07/01/2019 4:31:31 PM By: Montey Hora Entered By: Montey Hora on 07/01/2019 12:52:55 [name], [name] -------------------------------------------------------------------------------- Compression Therapy Details Patient Name: [name], [name]. Date of Service: 07/01/2019 12:45 PM Medical Record Number: [id number] Patient Account Number: [id number] Date of Birth/Sex: 28-Jul-1946 (73 y.o. F) Treating RN: Montey Hora Primary Care Xyla Leisner: [name] Other Clinician: Referring Ryleah Miramontes: [name] Treating Dariusz Brase/Extender: Tito Dine in Treatment: 29 Compression Therapy Performed for Wound Assessment: Wound #6 Left,Lateral Lower Leg Performed By: Clinician Montey Hora, RN Compression Type: Three Layer Electronic Signature(s) Signed: 07/01/2019 4:31:31 PM By: Montey Hora Entered By: Montey Hora on 07/01/2019 12:52:55 [name], [name] -------------------------------------------------------------------------------- Encounter Discharge Information Details Patient Name: [name], [name]. Date of Service: 07/01/2019 12:45 PM Medical Record Number: [id number] Patient Account Number: [id number] Date of Birth/Sex: 10/23/1946 (73 y.o. F) Treating RN: Montey Hora Primary Care Nyima Vanacker: [name] Other Clinician: Referring Azaylia Fong: [name] Treating Reise Hietala/Extender: Tito Dine in Treatment: 35 Encounter Discharge Information Items Discharge Condition: Stable Ambulatory Status: Ambulatory Discharge Destination: Home Transportation: Private Auto Accompanied By: self Schedule Follow-up Appointment: Yes Clinical Summary of Care: Electronic Signature(s) Signed: 07/01/2019 4:31:31 PM By: Montey Hora Entered By: Montey Hora on 07/01/2019 12:53:40 [name], [name]  ([id number]) -------------------------------------------------------------------------------- Wound Assessment Details Patient Name: [name], [name]. Date of Service: 07/01/2019 12:45 PM Medical Record Number: [id number] Patient Account Number: [id number] Date of Birth/Sex: 1945/12/23 (73 y.o. F) Treating RN: Montey Hora Primary Care Camilo Mander: [name] Other Clinician: Referring Jahrel Borthwick: [name] Treating Tequia Wolman/Extender: Tito Dine in Treatment: 29 Wound Status Wound Number: 5 Primary Lymphedema Etiology: Wound Location: Left Lower Leg - Medial Wound Open Wounding Event: Gradually Appeared Status: Date Acquired: 11/19/2018 Comorbid Cataracts, Asthma, Sleep Apnea, Deep Vein Weeks Of Treatment: 29 History: Thrombosis, Hypertension, Peripheral Venous Clustered Wound: No Disease, Osteoarthritis, Received Chemotherapy, Received Radiation Photos Wound Measurements Length: (cm) 2.7 Width: (cm) 2.6 Depth: (cm) 0.2  Area: (cm) 5.513 Volume: (cm) 1.103 % Reduction in Area: 35.5% % Reduction in Volume: -29% Epithelialization: None Tunneling: No Undermining: No Wound Description Full Thickness Without Exposed Support Classification: Structures Wound Margin: Indistinct, nonvisible Exudate Large Amount: Exudate Type: Purulent Exudate Color: yellow, brown, green Foul Odor After Cleansing: No Slough/Fibrino Yes Wound Bed Granulation Amount: Large (67-100%) Exposed Structure Granulation Quality: Pink Fascia Exposed: No Necrotic Amount: Small (1-33%) Fat Layer (Subcutaneous Tissue) Exposed: Yes Necrotic Quality: Adherent Slough Tendon Exposed: No Muscle Exposed: No Joint Exposed: No Bone Exposed: No [name], [name] ([id number]) Treatment Notes Wound #5 (Left, Medial Lower Leg) Notes nurse visit, dry gauze, 3 layer Electronic Signature(s) Signed: 07/01/2019 4:31:31 PM By: Montey Hora Entered By: Montey Hora on 07/01/2019  12:51:07 [name], [name] -------------------------------------------------------------------------------- Wound Assessment Details Patient Name: [name], [name]. Date of Service: 07/01/2019 12:45 PM Medical Record Number: [id number] Patient Account Number: [id number] Date of Birth/Sex: 20-Sep-1946 (73 y.o. F) Treating RN: Montey Hora Primary Care Caydon Feasel: [name] Other Clinician: Referring Christell Steinmiller: [name] Treating [name]/Extender: Tito Dine in Treatment: 29 Wound Status Wound Number: 6 Primary Venous Leg Ulcer Etiology: Wound Location: Left Lower Leg - Lateral Wound Open Wounding Event: Gradually Appeared Status: Date Acquired: 01/19/2019 Comorbid Cataracts, Asthma, Sleep Apnea, Deep Vein Weeks Of Treatment: 23 History: Thrombosis, Hypertension, Peripheral Venous Clustered Wound: No Disease, Osteoarthritis, Received Chemotherapy, Received Radiation Photos Wound Measurements Length: (cm) 3.4 Width: (cm) 3.7 Depth: (cm) 0.3 Area: (cm) 9.88 Volume: (cm) 2.964 % Reduction in Area: -4390.9% % Reduction in Volume: -13372.7% Epithelialization: None Wound Description Full Thickness Without Exposed Support Classification: Structures Wound Margin: Indistinct, nonvisible Exudate Large Amount: Exudate Type: Purulent Exudate Color: yellow, brown, green Foul Odor After Cleansing: No Slough/Fibrino Yes Wound Bed Granulation Amount: Medium (34-66%) Exposed Structure Granulation Quality: Pink Fascia Exposed: No Necrotic Amount: Medium (34-66%) Fat Layer (Subcutaneous Tissue) Exposed: Yes Necrotic Quality: Adherent Slough Tendon Exposed: No Muscle Exposed: No Joint Exposed: No Bone Exposed: No Mabey, [name] ([id number]) Treatment Notes Wound #6 (Left, Lateral Lower Leg) Notes nurse visit, dry gauze, 3 layer Electronic Signature(s) Signed: 07/01/2019 4:31:31 PM By: Montey Hora Entered By: Montey Hora on  07/01/2019 12:51:33

## 2019-07-03 DIAGNOSIS — G5603 Carpal tunnel syndrome, bilateral upper limbs: Secondary | ICD-10-CM | POA: Diagnosis not present

## 2019-07-06 ENCOUNTER — Encounter: Payer: Self-pay | Admitting: Podiatry

## 2019-07-06 ENCOUNTER — Ambulatory Visit (INDEPENDENT_AMBULATORY_CARE_PROVIDER_SITE_OTHER): Payer: Medicare Other | Admitting: Podiatry

## 2019-07-06 ENCOUNTER — Other Ambulatory Visit: Payer: Self-pay

## 2019-07-06 DIAGNOSIS — B351 Tinea unguium: Secondary | ICD-10-CM | POA: Diagnosis not present

## 2019-07-06 DIAGNOSIS — M79674 Pain in right toe(s): Secondary | ICD-10-CM

## 2019-07-06 DIAGNOSIS — I872 Venous insufficiency (chronic) (peripheral): Secondary | ICD-10-CM

## 2019-07-06 DIAGNOSIS — M79675 Pain in left toe(s): Secondary | ICD-10-CM | POA: Diagnosis not present

## 2019-07-06 NOTE — Progress Notes (Signed)
Complaint:  Visit Type: Patient returns to my office for continued preventative foot care services. Complaint: Patient states" my nails have grown long and thick and become painful to walk and wear shoes"  The patient presents for preventative foot care services. No changes to ROS.  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  Patient says she lost her fifth toenail left foot since last visit.  Podiatric Exam: Vascular: dorsalis pedis and posterior tibial pulses are palpable bilateral. Capillary return is immediate. Temperature gradient is WNL. Skin turgor WNL Varicosities  Feet  B/L  Swelling legs/ankle  B/L. Sensorium: Normal Semmes Weinstein monofilament test. Normal tactile sensation bilaterally. Nail Exam: Pt has thick disfigured discolored nails with subungual debris noted bilateral entire nail hallux through fifth toenails Ulcer Exam: There is no evidence of ulcer or pre-ulcerative changes or infection. Orthopedic Exam: Muscle tone and strength are WNL. No limitations in general ROM. No crepitus or effusions noted. Foot type and digits show no abnormalities. DJD  [name]  B/L Swelling ankles  B/L Skin: No Porokeratosis. No infection or ulcers.  Asymptomatic clavi.  Diagnosis:  Onychomycosis, , Pain in right toe, pain in left toes  Treatment & Plan Procedures and Treatment: Consent by patient was obtained for treatment procedures. The patient understood the discussion of treatment and procedures well. All questions were answered thoroughly reviewed. Debridement of mycotic and hypertrophic toenails, 1 through 5 bilateral and clearing of subungual debris. No ulceration, no infection noted.  Return Visit-Office Procedure: Patient instructed to return to the office for a follow up visit 9 weeks  for continued evaluation and treatment.    Gardiner Barefoot DPM

## 2019-07-08 ENCOUNTER — Encounter: Payer: Medicare Other | Attending: Internal Medicine | Admitting: Internal Medicine

## 2019-07-08 ENCOUNTER — Other Ambulatory Visit: Payer: Self-pay

## 2019-07-08 DIAGNOSIS — L97222 Non-pressure chronic ulcer of left calf with fat layer exposed: Secondary | ICD-10-CM | POA: Diagnosis not present

## 2019-07-08 DIAGNOSIS — J45909 Unspecified asthma, uncomplicated: Secondary | ICD-10-CM | POA: Diagnosis not present

## 2019-07-08 DIAGNOSIS — S81802A Unspecified open wound, left lower leg, initial encounter: Secondary | ICD-10-CM | POA: Diagnosis not present

## 2019-07-08 DIAGNOSIS — I890 Lymphedema, not elsewhere classified: Secondary | ICD-10-CM | POA: Insufficient documentation

## 2019-07-08 DIAGNOSIS — I10 Essential (primary) hypertension: Secondary | ICD-10-CM | POA: Insufficient documentation

## 2019-07-08 DIAGNOSIS — L97221 Non-pressure chronic ulcer of left calf limited to breakdown of skin: Secondary | ICD-10-CM | POA: Diagnosis not present

## 2019-07-08 DIAGNOSIS — I87312 Chronic venous hypertension (idiopathic) with ulcer of left lower extremity: Secondary | ICD-10-CM | POA: Diagnosis not present

## 2019-07-08 DIAGNOSIS — M1990 Unspecified osteoarthritis, unspecified site: Secondary | ICD-10-CM | POA: Insufficient documentation

## 2019-07-08 DIAGNOSIS — G4730 Sleep apnea, unspecified: Secondary | ICD-10-CM | POA: Insufficient documentation

## 2019-07-10 NOTE — Progress Notes (Signed)
[name], [name] Visit Report for 07/08/2019 Cellular or Tissue Based Product Details Patient Name: [name], [name]. Date of Service: 07/08/2019 12:45 PM Medical Record Number: [id number] Patient Account Number: [id number] Date of Birth/Sex: 07/08/1946 (73 y.o. F) Treating RN: Cornell Barman Primary Care Provider: [name] Other Clinician: Referring Provider: [name] Treating Provider/Extender: Tito Dine in Treatment: 30 Cellular or Tissue Based Wound #6 Left,Distal,Lateral Lower Leg Product Type Applied to: Performed By: Physician [name] Dillon, MD Cellular or Tissue Based Apligraf Product Type: Level of Consciousness (Pre- Awake and Alert procedure): Pre-procedure Verification/Time Yes - 13:40 Out Taken: Location: trunk / arms / legs Wound Size (sq cm): 13.26 Product Size (sq cm): 44 Waste Size (sq cm): 0 Amount of Product Applied (sq cm): 44 Instrument Used: Forceps Lot #: [id number] Expiration Date: 07/18/2019 Fenestrated: Yes Instrument: Blade Reconstituted: Yes Solution Type: normal saline Solution Amount: 5ML Lot #: [id number] Solution Expiration Date: 02/03/2021 Secured: Yes Secured With: Steri-Strips Dressing Applied: Yes Primary Dressing: mepitel one Procedural Pain: 0 Post Procedural Pain: 0 Response to Treatment: Procedure was tolerated well Level of Consciousness (Post- Awake and Alert procedure): Post Procedure Diagnosis Same as Pre-procedure Electronic Signature(s) Signed: 07/08/2019 5:28:46 PM By: Linton Ham MD Entered By: Linton Ham on 07/08/2019 14:00:04 [name], [name] [name], [name] -------------------------------------------------------------------------------- Debridement Details Patient Name: [name], [name]. Date of Service: 07/08/2019 12:45 PM Medical Record Number: [id number] Patient Account Number: [id number] Date of Birth/Sex: [birth year] (73 y.o. F) Treating RN:  Cornell Barman Primary Care Provider: [name] Other Clinician: Referring Provider: [name] Treating Provider/Extender: Tito Dine in Treatment: 30 Debridement Performed for Wound #5 Left,Medial Lower Leg Assessment: Performed By: Physician [name] Dillon, MD Debridement Type: Debridement Level of Consciousness (Pre- Awake and Alert procedure): Pre-procedure Verification/Time Yes - 13:33 Out Taken: Start Time: 13:33 Pain Control: Lidocaine Total Area Debrided (L x W): 3 (cm) x 3.1 (cm) = 9.3 (cm) Tissue and other material Viable, Non-Viable, Subcutaneous, Biofilm debrided: Level: Skin/Subcutaneous Tissue Debridement Description: Excisional Instrument: Curette Bleeding: Moderate Hemostasis Achieved: Pressure End Time: 13:34 Response to Treatment: Procedure was tolerated well Level of Consciousness Awake and Alert (Post-procedure): Post Debridement Measurements of Total Wound Length: (cm) 3 Width: (cm) 3.1 Depth: (cm) 0.1 Volume: (cm) 0.73 Character of Wound/Ulcer Post Debridement: Stable Post Procedure Diagnosis Same as Pre-procedure Electronic Signature(s) Signed: 07/08/2019 5:28:46 PM By: Linton Ham MD Signed: 07/10/2019 10:04:45 AM By: [name], [name], RN, CWS, [name], [name] Entered By: Linton Ham on 07/08/2019 13:59:40 Delahunty, [name] -------------------------------------------------------------------------------- HPI Details Patient Name: [name], [name]. Date of Service: 07/08/2019 12:45 PM Medical Record Number: [id number] Patient Account Number: [id number] Date of Birth/Sex: [birth year] (73 y.o. F) Treating RN: Cornell Barman Primary Care Provider: [name] Other Clinician: Referring Provider: [name] Treating Provider/Extender: Tito Dine in Treatment: 30 History of Present Illness HPI Description: Pleasant 73 year old with history of chronic venous insufficiency. No diabetes or  peripheral vascular disease. Left ABI 1.29. Questionable history of left lower extremity DVT. She developed a recurrent ulceration on her left lateral calf in December 2015, which she attributes to poor diet and subsequent lower extremity edema. She underwent endovenous laser ablation of her left greater saphenous vein in 2010. She underwent laser ablation of accessory branch of left GSV in April 2016 by Dr. Kellie Simmering at [facility]. She was previously wearing Unna boots, which she tolerated well. Tolerating 2 layer compression and cadexomer iodine. She returns to clinic for  follow-up and is without new complaints. She denies any significant pain at this time. She reports persistent pain with pressure. No claudication or ischemic rest pain. No fever or chills. No drainage. READMISSION 11/13/16; this is a 73 year old woman who is not a diabetic. She is here for a review of a painful area on her left medial lower extremity. I note that she was seen here previously last year for wound I believe to be in the same area. At that time she had undergone previously a left greater saphenous vein ablation by Dr. Kellie Simmering and she had a ablation of the anterior accessory branch of the left greater saphenous vein in March 2016. Seeing that the wound actually closed over. In reviewing the history with her today the ulcer in this area has been recurrent. She describes a biopsy of this area in 2009 that only showed stasis physiology. 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 Do not see that she has ever had arterial studies in the left leg. She tells me she has a follow-up with Dr. Kellie Simmering in roughly 10 days In any case she developed the reopening of this area roughly a month ago. On the background of  this she describes rapidly increasing edema which has responded to Lasix 40 mg and metolazone 2.5 mg as well as the patient's lymph massage. She has been told she has both venous insufficiency and lymphedema but she cannot tolerate compression stockings 11/28/16; the patient saw Dr. Kellie Simmering recently. Per the patient he did arterial Dopplers in the office that did not show evidence of arterial insufficiency, per the patient he stated "treat this like an ordinary venous ulcer". She also saw her dermatologist Dr. Ronnald Ramp who felt that this was more of a vascular ulcer. In general things are improving although she arrives today with increasing bilateral lower extremity edema with weeping a deeper fluid through the wound on the left medial leg compatible with some degree of lymphedema 12/04/16; the patient's wound is fully epithelialized but I don't think fully healed. We will do another week of depression with Promogran and TCA however I suspect we'll be able to discharge her next week. This is a very unusual-looking wound which was initially a figure-of-eight type wound lying on its side surrounded by petechial like hemorrhage. She has had venous ablation on this side. She apparently does not have an arterial issue per Dr. Kellie Simmering. She saw her dermatologist thought it was "vascular". Patient is definitely going to need ongoing compression and I talked about this with her today she will go to elastic therapy after she leaves here next week 12/11/16; the patient's wound is not completely closed today. She has surrounding scar tissue and in further discussion with the patient it would appear that she had ulcers in this area in 2009 for a prolonged period of time ultimately requiring a punch biopsy of this area that only showed venous insufficiency. I did not previously pickup on this part of the history from the patient. 12/18/16; the patient's wound is completely epithelialized. There is no open area here. She  has significant bilateral venous insufficiency with secondary lymphedema to a mild-to-moderate degree she does not have compression stockings.. She did not say anything to me when I was in the room, she told  our intake nurse that she was still having pain in this area. This isn't unusual recurrent small open area. She is going to go to elastic therapy to obtain compression stockings. 12/25/16; the patient's wound is fully epithelialized. There is no open area here. The patient describes some continued episodic discomfort in this area medial left calf. However everything looks fine and healed here. She is been to elastic therapy and caught herself 15-20 mmHg stockings, they apparently were having trouble getting 20-30 mm stockings in her size [name], [name] 01/22/17; this is a patient we discharged from the clinic a month ago. She has a recurrent open wound on her medial left calf. She had 15 mm support stockings. I told her I thought she needed 20-30 mm compression stockings. She tells me that she has been ill with hospitalization secondary to asthma and is been found to have severe hypokalemia likely secondary to a combination of Lasix and metolazone. This morning she noted blistering and leaking fluid on the posterior part of her left leg. She called our intake nurse urgently and we was saw her this afternoon. She has not had any real discomfort here. I don't know that she's been wearing any stockings on this leg for at least 2-3 days. ABIs in this clinic were 1.21 on the right and 1.3 on the left. She is previously seen vascular surgery who does not think that there is a peripheral arterial issue. 01/30/17; Patient arrives with no open wound on the left leg. She has been to elastic therapy and obtained 20-79mmhg below knee stockings and she has one on the right leg today. READMISSION 02/19/18; this Quadri is a now 73 year old patient we've had in this clinic perhaps 3 times before. I  had last looked at her from January 07 December 2016 with an area on the medial left leg. We discharged her on 12/25/16 however she had to be readmitted on 01/22/17 with a recurrence. I have in my notes that we discharged her on 20-30 mm stockings although she tells me she was only wearing support hose because she cannot get stockings on predominantly related to her cervical spine surgery/issues. She has had previous ablations done by vein and vascular in [place] including a great saphenous vein ablation on the left with an anterior accessory branch ablation I think both of these were in 2016. On one of the previous visit she had a biopsy noted 2009 that was negative. She is not felt to have an arterial issue. She is not a diabetic. She does have a history of obstructive sleep apnea hypertension asthma as well as chronic venous insufficiency and lymphedema. On this occasion she noted 2 dry scaly patch on her left leg. She tried to put lotion on this it didn't really help. There were 2 open areas.the patient has been seeing her primary physician from 02/05/18 through 02/14/18. She had Unna boots applied. The superior wound now on the lateral left leg has closed but she's had one wound that remains open on the lateral left leg. This is not the same spot as we dealt with in 2018. ABIs in this clinic were 1.3 bilaterally 02/26/18; patient has a small wound on the left lateral calf. Dimensions are down. She has chronic venous insufficiency and lymphedema. 03/05/18; small open area on the left lateral calf. Dimensions are down. Tightly adherent necrotic debris over the surface of the wound which was difficult to remove. Also the dressing [over collagen] stuck to the wound surface. This  was removed with some difficulty as well. Change the primary dressing to Hydrofera Blue ready 03/12/18; small open area on the left lateral calf. Comes in with tightly adherent surface eschar as well as some adherent Hydrofera  Blue. 03/19/18; open area on the left lateral calf. Again adherent surface eschar as well as some adherent Hydrofera Blue nonviable subcutaneous tissue. She complained of pain all week even with the reduction from 4-3 layer compression I put on last week. Also she had an increase in her ankle and calf measurements probably related to the same thing. 03/26/18; open area on the left lateral calf. A very small open area remains here. We used silver alginate starting last week as the Hydrofera Blue seem to stick to the wound bed. In using 4-layer compression 04/02/18; the open area in the left lateral calf at some adherent slough which I removed there is no open area here. We are able to transition her into her own compression stocking. Truthfully I think this is probably his support hose. However this does not maintain skin integrity will be limited. She cannot put over the toe compression stockings on because of neck problems hand problems etc. She is allergic to the lining layer of juxta lites. We might be forced to use extremitease stocking should this fail READMIT 11/24/2018 Patient is now a 73 year old woman who is not a diabetic. She has been in this clinic on at least 3 previous occasions largely with recurrent wounds on her left leg secondary to chronic venous insufficiency with secondary lymphedema. Her situation is complicated by inability to get stockings on and an allergy to neoprene which is apparently a component and at least juxta lites and other stockings. As a result she really has not been wearing any stockings on her legs. She tells [place] that roughly 2 or 3 weeks ago she started noticing a stinging sensation just above her ankle on the left medial aspect. She has been diagnosed with pseudogout and she wondered whether this was what she was experiencing. She tried to dress this with something she bought at the store however subsequently it pulled skin off and now she has an open  wound that is not improving. She has been using Vaseline gauze with a cover bandage. She saw her primary doctor last week who put an Haematologist on her. ABIs in this clinic was 1.03 on the left [name], [name]. ([id number]) 2/12; the area is on the left medial ankle. Odd-looking wound with what looks to be surface epithelialization but a multitude of small petechial openings. This clearly not closed yet. We have been using silver alginate under 3 layer compression with TCA 2/19; the wound area did not look quite as good this week. Necrotic debris over the majority of the wound surface which required debridement. She continues to have a multitude of what looked to be small petechial openings. She reminds [place] that she had a biopsy on this initially during her first outbreak in 2015 in [place] [facility]. She expresses concern about this being a possible melanoma. 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 I assured her that this does not look anything like melanoma. Except for the petechial reaction it does look like a venous insufficiency area and she certainly has evidence of this on both sides 2/26; a difficult area on the left medial ankle. The patient clearly has chronic venous hypertension with some degree of lymphedema. The  odd thing about the area is the small petechial hemorrhages. I am not really sure how to explain this. This was present last time and this is not a compression injury. We have been using Hydrofera Blue which I changed to last week 3/4; still using Hydrofera Blue. Aggressive debridement today. She does not have known arterial issues. She has seen Dr. Kellie Simmering at [name] vein and vascular and and has an ablation on the left. [Anterior accessory branch of the greater saphenous]. From what I remember they did not feel she had an arterial issue. The patient has had this area biopsied in  2009 at [facility] dermatology and by her recollection they said this was "stasis". She is also follow-up with dermatology locally who thought that this was more of a vascular issue 3/11; using Hydrofera Blue. Aggressive debridement today. She does not have an arterial issue. We are using 3 layer compression although we may need to go to 4. The patient has been in for multiple changes to her wrap since I last saw her a week ago. She says that the area was leaking. I do not have too much more information on what was found 01/19/19 on evaluation today patient was actually being seen for a nurse visit when unfortunately she had the area on her left lateral lower extremity as well as weeping from the right lower extremity that became apparent. Therefore we did end up actually seeing her for a full visit with myself. She is having some pain at this site as well but fortunately nothing too significant at this point. No fevers, chills, nausea, or vomiting noted at this time. 3/18-Patient is back to the clinic with the left leg venous leg ulcer, the ulcer is larger in size, has a surface that is densely adherent with fibrinous tissue, the Hydrofera Blue was used but is densely adherent and there was difficulty in removing it. The right lower extremity was also wrapped for weeping edema. Patient has a new area over the left lateral foot above the malleolus that is small and appears to have no debris with intact surrounding skin. Patient is on increased dose of Lasix also as a means to edema management 3/25; the patient has a nonhealing venous ulcer on the medial left leg and last week developed a smaller area on the lateral left calf. We have been using Hydrofera Blue with a contact layer. 4/1; no major change in these wounds areas. Left medial and more recently left lateral calf. I tried Iodoflex last week to aid in debridement she did not tolerate this. She stated her pain was terrible all week. She took the  top layer of the 4 layer compression off. 4/8; the patient actually looks somewhat better in terms of her more prominent left lateral calf wound. There is some healthy looking tissue here. She is still complaining of a lot of discomfort. 4/15; patient in a lot of pain secondary to sciatica. She is on a prednisone taper prescribed by her primary physician. She has the 2 areas one on the left medial and more recently a smaller area on the left lateral calf. Both of these just above the malleoli 4/22; her back pain is better but she still states she is very uncomfortable and now feels she is intolerant to the [employer]. No real change in the wounds we have been using Sorbact. She has been previously intolerant to Iodoflex. There is not a lot of option about what we can use to debride this wound  under compression that she no doubt needs. sHe states Ultram no longer works for her pain 4/29; no major change in the wounds slightly increased depth. Surface on the original medial wound perhaps somewhat improved however the more recent area on the lateral left ankle is 100% covered in very adherent debris we have been using Sorbact. She tolerates 4 layer compression well and her edema control is a lot better. She has not had to come in for a nurse check 5/6; no major change in the condition of the wounds. She did consent to debridement today which was done with some difficulty. Continuing Sorbact. She did not tolerate Iodoflex. She was in for a check of her compression the day after we wrapped her last week this was adjusted but nothing much was found 5/13; no major change in the condition or area of the wounds. I was able to get a fairly aggressive debridement done on the lateral left leg wound. Even using Sorbact under compression. She came back in on Friday to have the wrap changed. She says she felt uncomfortable on the lateral aspect of her ankle. She has a long history of chronic venous insufficiency  including previous ablation surgery on this side. 5/20-Patient returns for wounds on left leg with both wounds covered in slough, with the lateral leg wound larger in size, she has been in 3 layer compression and felt more comfortable, she describes pain in ankle, in leg and pins and needles in foot, and is about to try Pamelor for this 6/3; wounds on the left lateral and left medial leg. The area medially which is the most recent of the 2 seems to have had the largest increase in dimensions. We have been using Sorbac to try and debride the surface. She has been to see orthopedics they apparently did a plain x-ray that was indeterminant. Diagnosed her with neuropathy and they have ordered an MRI to [name], [name]. ([id number]) determine if there is underlying osteomyelitis. This was not high on my thought list but I suppose it is prudent. We have advised her to make an appointment with vein and vascular in [place]. She has a history of a left greater saphenous and accessory vein ablations I wonder if there is anything else that can be done from a surgical point of view to help in these difficult refractory wounds. We have previously healed this wound on one occasion but it keeps on reopening [medial side] 6/10; deep tissue culture I did last week I think on the left medial wound showed both moderate E. coli and moderate staph aureus [MSSA]. She is going to require antibiotics and I have chosen Augmentin. We have been using Sorbact and we have made better looking wound surface on both sides but certainly no improvement in wound area. She was back in last Friday apparently for a dressing changes the wrap was hurting her outer left ankle. She has not managed to get a hold of vein and vascular in [place]. We are going to have to make her that appointment 6/17; patient is tolerating the Augmentin. She had an MRI that I think was ordered by orthopedic surgeon this did not show osteomyelitis or an  abscess did suggest cellulitis. We have been using Sorbact to the lateral and medial ankles. We have been trying to arrange a follow-up appointment with vein and vascular in [place] or did her original ablations. We apparently an area sent the request to vein and vascular in [facility] 6/24; patient has completed  the Augmentin. We do not yet have a vein and vascular appointment in [place]. I am not sure what the issue is here we have asked her to call tomorrow. We are using Sorbact. Making some improvements and especially the medial wound. Both surfaces however look better medial and lateral. 7/1; the patient has been in contact with vein and vascular in [place] but has not yet received an appointment. Using Sorbact we have gradually improve the wound surface with no improvement in surface area. She is approved for Apligraf but the wound surface still is not completely viable. She has not had to come in for a dressing change 7/8; the patient has an appointment with vein and vascular on 7/31 which is a Friday afternoon. She is concerned about getting back here for [place] to dress her wounds. I think it is important to have them goal for her venous reflux/history of ablations etc. to see if anything else can be done. She apparently tested positive for 1 of the blood tests with regards to lupus and saw a rheumatologist. He has raised the issue of vasculitis again. I have had this thought in the past however the evidence seems overwhelming that this is a venous reflux etiology. If the rheumatologist tells me there is clinical and laboratory investigation is positive for lupus I will rethink this. 7/15; the patient's wound surfaces are quite a bit better. The medial area which was her original wound now has no depth although the lateral wound which was the more recent area actually appears larger. Both with viable surfaces which is indeed better. Using Sorbact. I wanted to use Apligraf on her  however there is the issue of the vein and vascular appointment on 7/31 at 2:00 in the afternoon which would not allow her to get back to be rewrapped and they would no doubt remove the graft 7/22; the patient's wound surfaces have moderate amount of debris although generally look better. The lateral one is larger with 2 small satellite areas superiorly. We are waiting for her vein and vascular appointment on 7/31. She has been approved for Apligraf which I would like to use after th 7/29; wound surfaces have improved no debridement is required we have been using Sorbact. She sees vein and vascular on Friday with this so question of whether anything can be done to lessen the likelihood of recurrence and/or speed the healing of these areas. She is already had previous ablations. She no doubt has severe venous hypertension 8/5-Patient returns at 1 week, she was in [place] for 3 days by her podiatrist, we have been using so backed to the wound, she has increased pain in both the wounds on the left lower leg especially the more distal one on the lateral aspect 8/12-Patient returns at 1 week and she is agreeable to having debridement in both wounds on her left leg today. We have been using Sorbact, and vascular studies were reviewed at last visit 8/19; the patient arrives with her wounds fairly clean and no debridement is required. We have used Sorbact which is really done a nice job in cleaning up these very difficult wound surfaces. The patient saw Dr. Donzetta Matters of vascular surgery on 7/31. He did not feel that there was an arterial component. He felt that her treated greater saphenous vein is adequately addressed and that the small saphenous vein did not appear to be involved significantly. She was also noted to have deep venous reflux which is not treatable. Dr. Donzetta Matters mentioned the  possibility of a central obstructive component leading to reflux and he offered her central venography. She wanted to  discuss this or think about it. I have urged her to go ahead with this. She has had recurrent difficult wounds in these areas which do heal but after months in the clinic. If there is anything that can be done to reduce the likelihood of this I think it is worth it. 9/2 she is still working towards getting follow-up with Dr. Donzetta Matters to schedule her central venography. I put Apligraf on 2 weeks ago on both wounds on the medial and lateral part of her left lower leg. She arrives in clinic today with 3 superficial additional wounds above the area laterally and one below the wound medially. She describes a lot of discomfort. I think these are probably wrapped injuries. Does not look like she has cellulitis. Electronic Signature(s) Signed: 07/08/2019 5:28:46 PM By: Linton Ham MD Entered By: Linton Ham on 07/08/2019 14:01:54 KATRENA, KALINOWSKI ([id number], ARCEGA ([id number]) -------------------------------------------------------------------------------- Physical Exam Details Patient Name: MAJESTY, THIND. Date of Service: 07/08/2019 12:45 PM Medical Record Number: [id number] Patient Account Number: [id number] Date of Birth/Sex: 1946-07-14 (73 y.o. F) Treating RN: Cornell Barman Primary Care Provider: [name] Other Clinician: Referring Provider: [name] Treating Provider/Extender: Tito Dine in Treatment: 30 Constitutional Sitting or standing Blood Pressure is within target range for patient.. Pulse regular and within target range for patient.Marland Kitchen Respirations regular, non-labored and within target range.. Temperature is normal and within the target range for the patient.Marland Kitchen appears in no distress. Notes Wound exam; both wounds are the same certainly no better. Debrided with an open curette adherent debris. We applied Apligraf #2. She has additional wounds superiorly laterally and inferiorly medially. These look like wrapped injuries there is no evidence of  infection. Her peripheral pulses are palpable Electronic Signature(s) Signed: 07/08/2019 5:28:46 PM By: Linton Ham MD Entered By: Linton Ham on 07/08/2019 14:05:48 ANALLELY, PRIVETT ([id number]) -------------------------------------------------------------------------------- Physician Orders Details Patient Name: ILYA, VALLECILLO. Date of Service: 07/08/2019 12:45 PM Medical Record Number: [id number] Patient Account Number: [id number] Date of Birth/Sex: Jun 03, 1946 (73 y.o. F) Treating RN: Cornell Barman Primary Care Provider: [name] Other Clinician: Referring Provider: [name] Treating Provider/Extender: Tito Dine in Treatment: 30 Verbal / Phone Orders: No Diagnosis Coding Wound Cleansing Wound #5 Left,Medial Lower Leg o Clean wound with Normal Saline. o May Shower, gently pat wound dry prior to applying new dressing. Wound #6 Left,Distal,Lateral Lower Leg o Clean wound with Normal Saline. o May Shower, gently pat wound dry prior to applying new dressing. Wound #7 Left,Proximal,Lateral Lower Leg o Clean wound with Normal Saline. o May Shower, gently pat wound dry prior to applying new dressing. Wound #8 Left,Lateral Lower Leg o Clean wound with Normal Saline. o May Shower, gently pat wound dry prior to applying new dressing. Wound #9 Left,Lateral,Posterior Lower Leg o Clean wound with Normal Saline. o May Shower, gently pat wound dry prior to applying new dressing. Anesthetic (add to Medication List) Wound #5 Left,Medial Lower Leg o Topical Lidocaine 4% cream applied to wound bed prior to debridement (In Clinic Only). Wound #6 Left,Distal,Lateral Lower Leg o Topical Lidocaine 4% cream applied to wound bed prior to debridement (In Clinic Only). Wound #7 Left,Proximal,Lateral Lower Leg o Topical Lidocaine 4% cream applied to wound bed prior to debridement (In Clinic Only). Wound #8 Left,Lateral Lower Leg o Topical  Lidocaine 4% cream applied to wound bed prior to debridement (  In Clinic Only). Wound #9 Left,Lateral,Posterior Lower Leg o Topical Lidocaine 4% cream applied to wound bed prior to debridement (In Clinic Only). Skin Barriers/Peri-Wound Care Wound #5 Left,Medial Lower Leg o Barrier cream - Zinc oxide o Other: - Antibiotic ointment on irritated areas Wound #6 Left,Distal,Lateral Lower Leg o Barrier cream - Zinc oxide o Other: - Antibiotic ointment on irritated areas [name], [name] Wound #7 Left,Proximal,Lateral Lower Leg o Barrier cream - Zinc oxide o Other: - Antibiotic ointment on irritated areas Wound #8 Left,Lateral Lower Leg o Barrier cream - Zinc oxide o Other: - Antibiotic ointment on irritated areas Wound #9 Left,Lateral,Posterior Lower Leg o Barrier cream - Zinc oxide o Other: - Antibiotic ointment on irritated areas Primary Wound Dressing Wound #7 Left,Proximal,Lateral Lower Leg o Silver Alginate Wound #8 Left,Lateral Lower Leg o Silver Alginate Wound #9 Left,Lateral,Posterior Lower Leg o Silver Alginate Secondary Dressing Wound #5 Left,Medial Lower Leg o ABD pad Wound #6 Left,Distal,Lateral Lower Leg o ABD pad Wound #7 Left,Proximal,Lateral Lower Leg o ABD pad Wound #8 Left,Lateral Lower Leg o ABD pad Wound #9 Left,Lateral,Posterior Lower Leg o ABD pad Dressing Change Frequency Wound #5 Left,Medial Lower Leg o Change dressing every week Wound #6 Left,Distal,Lateral Lower Leg o Change dressing every week Wound #7 Left,Proximal,Lateral Lower Leg o Change dressing every week Wound #8 Left,Lateral Lower Leg o Change dressing every week Wound #9 Left,Lateral,Posterior Lower Leg o Change dressing every week Follow-up Appointments [name], [name] Wound #5 Left,Medial Lower Leg o Return Appointment in 1 week. o Nurse Visit as needed Wound #6 Left,Distal,Lateral Lower Leg o Return  Appointment in 1 week. o Nurse Visit as needed Wound #7 Left,Proximal,Lateral Lower Leg o Return Appointment in 1 week. o Nurse Visit as needed Wound #8 Left,Lateral Lower Leg o Return Appointment in 1 week. o Nurse Visit as needed Wound #9 Left,Lateral,Posterior Lower Leg o Return Appointment in 1 week. o Nurse Visit as needed Edema Control Wound #5 Left,Medial Lower Leg o 3 Layer Compression System - Left Lower Extremity Wound #6 Left,Distal,Lateral Lower Leg o 3 Layer Compression System - Left Lower Extremity Wound #7 Left,Proximal,Lateral Lower Leg o 3 Layer Compression System - Left Lower Extremity Wound #8 Left,Lateral Lower Leg o 3 Layer Compression System - Left Lower Extremity Wound #9 Left,Lateral,Posterior Lower Leg o 3 Layer Compression System - Left Lower Extremity Off-Loading Wound #5 Left,Medial Lower Leg o Other: - Elevate legs as needed Wound #6 Left,Distal,Lateral Lower Leg o Other: - Elevate legs as needed Wound #7 Left,Proximal,Lateral Lower Leg o Other: - Elevate legs as needed Wound #8 Left,Lateral Lower Leg o Other: - Elevate legs as needed Wound #9 Left,Lateral,Posterior Lower Leg o Other: - Elevate legs as needed Advanced Therapies Wound #5 Left,Medial Lower Leg o Apligraf application in clinic; including contact layer, fixation with steri strips, dry gauze and cover dressing. LARENE, ANDREA ([id number]) Wound #6 Left,Distal,Lateral Lower Leg o Apligraf application in clinic; including contact layer, fixation with steri strips, dry gauze and cover dressing. Electronic Signature(s) Signed: 07/08/2019 5:28:46 PM By: Linton Ham MD Signed: 07/10/2019 10:04:45 AM By: [name], [name], RN, CWS, [name], [name] Entered By: [name], [name], RN, CWS, Kim on 07/08/2019 13:39:37 ALEEAH, POLIDORO ([id number]) -------------------------------------------------------------------------------- Problem List Details Patient Name: JONICA, PHILLIP. Date of Service: 07/08/2019 12:45 PM Medical Record Number: [id number] Patient Account Number: [id number] Date of Birth/Sex: 1946/04/02 (73 y.o. F) Treating RN: Cornell Barman Primary Care Provider: [name] Other Clinician: Referring Provider: [name] Treating  Provider/Extender: Tito Dine in Treatment: 30 Active Problems ICD-10 Evaluated Encounter Code Description Active Date Today Diagnosis L97.221 Non-pressure chronic ulcer of left calf limited to breakdown of 01/07/2019 No Yes skin I87.321 Chronic venous hypertension (idiopathic) with inflammation of 12/10/2018 No Yes right lower extremity I89.0 Lymphedema, not elsewhere classified 12/10/2018 No Yes Inactive Problems Resolved Problems Electronic Signature(s) Signed: 07/08/2019 5:28:46 PM By: Linton Ham MD Entered By: Linton Ham on 07/08/2019 13:59:09 Kolker, [name] -------------------------------------------------------------------------------- Progress Note Details Patient Name: KELICIA, PULS. Date of Service: 07/08/2019 12:45 PM Medical Record Number: [id number] Patient Account Number: [id number] Date of Birth/Sex: 11-02-1946 (73 y.o. F) Treating RN: Cornell Barman Primary Care Provider: [name] Other Clinician: Referring Provider: [name] Treating Provider/Extender: Tito Dine in Treatment: 30 Subjective History of Present Illness (HPI) Pleasant 73 year old with history of chronic venous insufficiency. No diabetes or peripheral vascular disease. Left ABI 1.29. Questionable history of left lower extremity DVT. She developed a recurrent ulceration on her left lateral calf in December 2015, which she attributes to poor diet and subsequent lower extremity edema. She underwent endovenous laser ablation of her left greater saphenous vein in 2010. She underwent laser ablation of accessory branch of left GSV in April 2016 by Dr. Kellie Simmering at [facility].  She was previously wearing Unna boots, which she tolerated well. Tolerating 2 layer compression and cadexomer iodine. She returns to clinic for follow-up and is without new complaints. She denies any significant pain at this time. She reports persistent pain with pressure. No claudication or ischemic rest pain. No fever or chills. No drainage. READMISSION 11/13/16; this is a 73 year old woman who is not a diabetic. She is here for a review of a painful area on her left medial lower extremity. I note that she was seen here previously last year for wound I believe to be in the same area. At that time she had undergone previously a left greater saphenous vein ablation by Dr. Kellie Simmering and she had a ablation of the anterior accessory branch of the left greater saphenous vein in March 2016. Seeing that the wound actually closed over. In reviewing the history with her today the ulcer in this area has been recurrent. She describes a biopsy of this area in 2009 that only showed stasis physiology. 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 Do not see that she has ever had arterial studies in the left leg. She tells me she has a follow-up with Dr. Kellie Simmering in roughly 10 days In any case she developed the reopening of this area roughly a month ago. On the background of this she describes rapidly increasing edema which has responded to Lasix 40 mg and metolazone 2.5 mg as well as the patient's lymph massage. She has been told she has both venous insufficiency and lymphedema but she cannot tolerate compression stockings 11/28/16; the patient saw Dr. Kellie Simmering recently. Per the patient he did arterial Dopplers in the office that did not show evidence of arterial insufficiency, per the patient he stated  "treat this like an ordinary venous ulcer". She also saw her dermatologist Dr. Ronnald Ramp who felt that this was more of a vascular ulcer. In general things are improving although she arrives today with increasing bilateral lower extremity edema with  weeping a deeper fluid through the wound on the left medial leg compatible with some degree of lymphedema 12/04/16; the patient's wound is fully epithelialized but I don't think fully healed. We will do another week of depression with Promogran and TCA however I suspect we'll be able to discharge her next week. This is a very unusual-looking wound which was initially a figure-of-eight type wound lying on its side surrounded by petechial like hemorrhage. She has had venous ablation on this side. She apparently does not have an arterial issue per Dr. Kellie Simmering. She saw her dermatologist thought it was "vascular". Patient is definitely going to need ongoing compression and I talked about this with her today she will go to elastic therapy after she leaves here next week 12/11/16; the patient's wound is not completely closed today. She has surrounding scar tissue and in further discussion with the patient it would appear that she had ulcers in this area in 2009 for a prolonged period of time ultimately requiring a punch biopsy of this area that only showed venous insufficiency. I did not previously pickup on this part of the history from the patient. 12/18/16; the patient's wound is completely epithelialized. There is no open area here. She has significant bilateral venous insufficiency with secondary lymphedema to a mild-to-moderate degree she does not have compression stockings.. She did not say anything to me when I was in the room, she told our intake nurse that she was still having pain in this area. This isn't unusual recurrent small open area. She is going to go to elastic therapy to obtain compression stockings. 12/25/16; the patient's wound is fully  epithelialized. There is no open area here. The patient describes some continued episodic discomfort in this area medial left calf. However everything looks fine and healed here. She is been to elastic therapy and [name], [name] caught herself 15-20 mmHg stockings, they apparently were having trouble getting 20-30 mm stockings in her size 01/22/17; this is a patient we discharged from the clinic a month ago. She has a recurrent open wound on her medial left calf. She had 15 mm support stockings. I told her I thought she needed 20-30 mm compression stockings. She tells me that she has been ill with hospitalization secondary to asthma and is been found to have severe hypokalemia likely secondary to a combination of Lasix and metolazone. This morning she noted blistering and leaking fluid on the posterior part of her left leg. She called our intake nurse urgently and we was saw her this afternoon. She has not had any real discomfort here. I don't know that she's been wearing any stockings on this leg for at least 2-3 days. ABIs in this clinic were 1.21 on the right and 1.3 on the left. She is previously seen vascular surgery who does not think that there is a peripheral arterial issue. 01/30/17; Patient arrives with no open wound on the left leg. She has been to elastic therapy and obtained 20-40mmhg below knee stockings and she has one on the right leg today. READMISSION 02/19/18; this Jost is a now 73 year old patient we've had in this clinic perhaps 3 times before. I had last looked at her from January 07 December 2016 with an area on the medial left leg. We discharged her on 12/25/16 however she had to be readmitted on 01/22/17 with a recurrence. I have in my notes that we discharged her on 20-30 mm stockings although she tells me she was only wearing support hose  because she cannot get stockings on predominantly related to her cervical spine surgery/issues. She has had previous  ablations done by vein and vascular in [place] including a great saphenous vein ablation on the left with an anterior accessory branch ablation I think both of these were in 2016. On one of the previous visit she had a biopsy noted 2009 that was negative. She is not felt to have an arterial issue. She is not a diabetic. She does have a history of obstructive sleep apnea hypertension asthma as well as chronic venous insufficiency and lymphedema. On this occasion she noted 2 dry scaly patch on her left leg. She tried to put lotion on this it didn't really help. There were 2 open areas.the patient has been seeing her primary physician from 02/05/18 through 02/14/18. She had Unna boots applied. The superior wound now on the lateral left leg has closed but she's had one wound that remains open on the lateral left leg. This is not the same spot as we dealt with in 2018. ABIs in this clinic were 1.3 bilaterally 02/26/18; patient has a small wound on the left lateral calf. Dimensions are down. She has chronic venous insufficiency and lymphedema. 03/05/18; small open area on the left lateral calf. Dimensions are down. Tightly adherent necrotic debris over the surface of the wound which was difficult to remove. Also the dressing [over collagen] stuck to the wound surface. This was removed with some difficulty as well. Change the primary dressing to Hydrofera Blue ready 03/12/18; small open area on the left lateral calf. Comes in with tightly adherent surface eschar as well as some adherent Hydrofera Blue. 03/19/18; open area on the left lateral calf. Again adherent surface eschar as well as some adherent Hydrofera Blue nonviable subcutaneous tissue. She complained of pain all week even with the reduction from 4-3 layer compression I put on last week. Also she had an increase in her ankle and calf measurements probably related to the same thing. 03/26/18; open area on the left lateral calf. A very small open area  remains here. We used silver alginate starting last week as the Hydrofera Blue seem to stick to the wound bed. In using 4-layer compression 04/02/18; the open area in the left lateral calf at some adherent slough which I removed there is no open area here. We are able to transition her into her own compression stocking. Truthfully I think this is probably his support hose. However this does not maintain skin integrity will be limited. She cannot put over the toe compression stockings on because of neck problems hand problems etc. She is allergic to the lining layer of juxta lites. We might be forced to use extremitease stocking should this fail READMIT 11/24/2018 Patient is now a 73 year old woman who is not a diabetic. She has been in this clinic on at least 3 previous occasions largely with recurrent wounds on her left leg secondary to chronic venous insufficiency with secondary lymphedema. Her situation is complicated by inability to get stockings on and an allergy to neoprene which is apparently a component and at least juxta lites and other stockings. As a result she really has not been wearing any stockings on her legs. She tells [place] that roughly 2 or 3 weeks ago she started noticing a stinging sensation just above her ankle on the left medial aspect. She has been diagnosed with pseudogout and she wondered whether this was what she was experiencing. She tried to dress this with something she bought  at the store however subsequently it pulled skin off and now she has an open wound that is not improving. She has been using Vaseline gauze with a cover bandage. She saw her primary doctor last week who put an Haematologist on her. ABIs in this clinic was 1.03 on the left MCKAY, NOTZ. ([id number]) 2/12; the area is on the left medial ankle. Odd-looking wound with what looks to be surface epithelialization but a multitude of small petechial openings. This clearly not closed yet. We have been using  silver alginate under 3 layer compression with TCA 2/19; the wound area did not look quite as good this week. Necrotic debris over the majority of the wound surface which required debridement. She continues to have a multitude of what looked to be small petechial openings. She reminds [place] that she had a biopsy on this initially during her first outbreak in 2015 in [place] [facility]. She expresses concern about this being a possible melanoma. 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 I assured her that this does not look anything like melanoma. Except for the petechial reaction it does look like a venous insufficiency area and she certainly has evidence of this on both sides 2/26; a difficult area on the left medial ankle. The patient clearly has chronic venous hypertension with some degree of lymphedema. The odd thing about the area is the small petechial hemorrhages. I am not really sure how to explain this. This was present last time and this is not a compression injury. We have been using Hydrofera Blue which I changed to last week 3/4; still using Hydrofera Blue. Aggressive debridement today. She does not have known arterial issues. She has seen Dr. Kellie Simmering at [facility] vein [facility] vascular and and has an ablation on the left. [Anterior accessory branch of the greater saphenous]. From what I remember they did not feel she had an arterial issue. The patient has had this area biopsied in 2009 at [facility] and by her recollection they said this was "stasis". She is also follow-up with dermatology locally who thought that this was more of a vascular issue 3/11; using Hydrofera Blue. Aggressive debridement today. She does not have an arterial issue. We are using 3 layer compression although we may need to go to 4. The patient has been in for multiple changes to her wrap since I last saw her a week  ago. She says that the area was leaking. I do not have too much more information on what was found 01/19/19 on evaluation today patient was actually being seen for a nurse visit when unfortunately she had the area on her left lateral lower extremity as well as weeping from the right lower extremity that became apparent. Therefore we did end up actually seeing her for a full visit with myself. She is having some pain at this site as well but fortunately nothing too significant at this point. No fevers, chills, nausea, or vomiting noted at this time. 3/18-Patient is back to the clinic with the left leg venous leg ulcer, the ulcer is larger in size, has a surface that is densely adherent with fibrinous tissue, the Hydrofera Blue was used but is densely adherent and there was difficulty in removing it. The right lower extremity was also wrapped for weeping edema. Patient has a new area over the left lateral foot above the malleolus that is small and appears to have no  debris with intact surrounding skin. Patient is on increased dose of Lasix also as a means to edema management 3/25; the patient has a nonhealing venous ulcer on the medial left leg and last week developed a smaller area on the lateral left calf. We have been using Hydrofera Blue with a contact layer. 4/1; no major change in these wounds areas. Left medial and more recently left lateral calf. I tried Iodoflex last week to aid in debridement she did not tolerate this. She stated her pain was terrible all week. She took the top layer of the 4 layer compression off. 4/8; the patient actually looks somewhat better in terms of her more prominent left lateral calf wound. There is some healthy looking tissue here. She is still complaining of a lot of discomfort. 4/15; patient in a lot of pain secondary to sciatica. She is on a prednisone taper prescribed by her primary physician. She has the 2 areas one on the left medial and more recently a  smaller area on the left lateral calf. Both of these just above the malleoli 4/22; her back pain is better but she still states she is very uncomfortable and now feels she is intolerant to the [employer]. No real change in the wounds we have been using Sorbact. She has been previously intolerant to Iodoflex. There is not a lot of option about what we can use to debride this wound under compression that she no doubt needs. sHe states Ultram no longer works for her pain 4/29; no major change in the wounds slightly increased depth. Surface on the original medial wound perhaps somewhat improved however the more recent area on the lateral left ankle is 100% covered in very adherent debris we have been using Sorbact. She tolerates 4 layer compression well and her edema control is a lot better. She has not had to come in for a nurse check 5/6; no major change in the condition of the wounds. She did consent to debridement today which was done with some difficulty. Continuing Sorbact. She did not tolerate Iodoflex. She was in for a check of her compression the day after we wrapped her last week this was adjusted but nothing much was found 5/13; no major change in the condition or area of the wounds. I was able to get a fairly aggressive debridement done on the lateral left leg wound. Even using Sorbact under compression. She came back in on Friday to have the wrap changed. She says she felt uncomfortable on the lateral aspect of her ankle. She has a long history of chronic venous insufficiency including previous ablation surgery on this side. 5/20-Patient returns for wounds on left leg with both wounds covered in slough, with the lateral leg wound larger in size, she has been in 3 layer compression and felt more comfortable, she describes pain in ankle, in leg and pins and needles in foot, and is about to try Pamelor for this 6/3; wounds on the left lateral and left medial leg. The area medially which is  the most recent of the 2 seems to have had the largest increase in dimensions. We have been using Sorbac to try and debride the surface. She has been to see orthopedics LARRI, MAQUEDA ([id number]) they apparently did a plain x-ray that was indeterminant. Diagnosed her with neuropathy and they have ordered an MRI to determine if there is underlying osteomyelitis. This was not high on my thought list but I suppose it is prudent. We have advised  her to make an appointment with vein and vascular in [place]. She has a history of a left greater saphenous and accessory vein ablations I wonder if there is anything else that can be done from a surgical point of view to help in these difficult refractory wounds. We have previously healed this wound on one occasion but it keeps on reopening [medial side] 6/10; deep tissue culture I did last week I think on the left medial wound showed both moderate E. coli and moderate staph aureus [MSSA]. She is going to require antibiotics and I have chosen Augmentin. We have been using Sorbact and we have made better looking wound surface on both sides but certainly no improvement in wound area. She was back in last Friday apparently for a dressing changes the wrap was hurting her outer left ankle. She has not managed to get a hold of vein and vascular in [place]. We are going to have to make her that appointment 6/17; patient is tolerating the Augmentin. She had an MRI that I think was ordered by orthopedic surgeon this did not show osteomyelitis or an abscess did suggest cellulitis. We have been using Sorbact to the lateral and medial ankles. We have been trying to arrange a follow-up appointment with vein and vascular in [place] or did her original ablations. We apparently an area sent the request to vein and vascular in [facility] 6/24; patient has completed the Augmentin. We do not yet have a vein and vascular appointment in [place]. I am not sure what  the issue is here we have asked her to call tomorrow. We are using Sorbact. Making some improvements and especially the medial wound. Both surfaces however look better medial and lateral. 7/1; the patient has been in contact with vein and vascular in [place] but has not yet received an appointment. Using Sorbact we have gradually improve the wound surface with no improvement in surface area. She is approved for Apligraf but the wound surface still is not completely viable. She has not had to come in for a dressing change 7/8; the patient has an appointment with vein and vascular on 7/31 which is a Friday afternoon. She is concerned about getting back here for [place] to dress her wounds. I think it is important to have them goal for her venous reflux/history of ablations etc. to see if anything else can be done. She apparently tested positive for 1 of the blood tests with regards to lupus and saw a rheumatologist. He has raised the issue of vasculitis again. I have had this thought in the past however the evidence seems overwhelming that this is a venous reflux etiology. If the rheumatologist tells me there is clinical and laboratory investigation is positive for lupus I will rethink this. 7/15; the patient's wound surfaces are quite a bit better. The medial area which was her original wound now has no depth although the lateral wound which was the more recent area actually appears larger. Both with viable surfaces which is indeed better. Using Sorbact. I wanted to use Apligraf on her however there is the issue of the vein and vascular appointment on 7/31 at 2:00 in the afternoon which would not allow her to get back to be rewrapped and they would no doubt remove the graft 7/22; the patient's wound surfaces have moderate amount of debris although generally look better. The lateral one is larger with 2 small satellite areas superiorly. We are waiting for her vein and vascular appointment on 7/31. She  has been approved for Apligraf which I would like to use after th 7/29; wound surfaces have improved no debridement is required we have been using Sorbact. She sees vein and vascular on Friday with this so question of whether anything can be done to lessen the likelihood of recurrence and/or speed the healing of these areas. She is already had previous ablations. She no doubt has severe venous hypertension 8/5-Patient returns at 1 week, she was in [place] for 3 days by her podiatrist, we have been using so backed to the wound, she has increased pain in both the wounds on the left lower leg especially the more distal one on the lateral aspect 8/12-Patient returns at 1 week and she is agreeable to having debridement in both wounds on her left leg today. We have been using Sorbact, and vascular studies were reviewed at last visit 8/19; the patient arrives with her wounds fairly clean and no debridement is required. We have used Sorbact which is really done a nice job in cleaning up these very difficult wound surfaces. The patient saw Dr. Donzetta Matters of vascular surgery on 7/31. He did not feel that there was an arterial component. He felt that her treated greater saphenous vein is adequately addressed and that the small saphenous vein did not appear to be involved significantly. She was also noted to have deep venous reflux which is not treatable. Dr. Donzetta Matters mentioned the possibility of a central obstructive component leading to reflux and he offered her central venography. She wanted to discuss this or think about it. I have urged her to go ahead with this. She has had recurrent difficult wounds in these areas which do heal but after months in the clinic. If there is anything that can be done to reduce the likelihood of this I think it is worth it. 9/2 she is still working towards getting follow-up with Dr. Donzetta Matters to schedule her central venography. I put Apligraf on 2 weeks ago on both wounds on the medial  and lateral part of her left lower leg. She arrives in clinic today with 3 superficial additional wounds above the area laterally and one below the wound medially. She describes a lot of discomfort. I think these are probably wrapped injuries. Does not look like she has cellulitis. [name], [name] Objective Constitutional Sitting or standing Blood Pressure is within target range for patient.. Pulse regular and within target range for patient.Marland Kitchen Respirations regular, non-labored and within target range.. Temperature is normal and within the target range for the patient.Marland Kitchen appears in no distress. Vitals Time Taken: 12:55 PM, Height: 63 in, Weight: 224.7 lbs, BMI: 39.8, Temperature: 98.4 F, Pulse: 88 bpm, Respiratory Rate: 16 breaths/min, Blood Pressure: 131/68 mmHg. General Notes: Wound exam; both wounds are the same certainly no better. Debrided with an open curette adherent debris. We applied Apligraf #2. She has additional wounds superiorly laterally and inferiorly medially. These look like wrapped injuries there is no evidence of infection. Her peripheral pulses are palpable Integumentary (Hair, Skin) Wound #5 status is Open. Original cause of wound was Gradually Appeared. The wound is located on the Left,Medial Lower Leg. The wound measures 3cm length x 3.1cm width x 0.1cm depth; 7.304cm^2 area and 0.73cm^3 volume. There is Fat Layer (Subcutaneous Tissue) Exposed exposed. There is no tunneling or undermining noted. There is a large amount of purulent drainage noted. The wound margin is indistinct and nonvisible. There is large (67-100%) pink, hyper - granulation within the wound bed. There is  a small (1-33%) amount of necrotic tissue within the wound bed including Adherent Slough. Wound #6 status is Open. Original cause of wound was Gradually Appeared. The wound is located on the Left,Distal,Lateral Lower Leg. The wound measures 3.4cm length x 3.9cm width x 0.3cm depth;  10.414cm^2 area and 3.124cm^3 volume. There is Fat Layer (Subcutaneous Tissue) Exposed exposed. There is no tunneling or undermining noted. There is a large amount of purulent drainage noted. The wound margin is indistinct and nonvisible. There is small (1-33%) pink granulation within the wound bed. There is a large (67-100%) amount of necrotic tissue within the wound bed including Adherent Slough. Wound #7 status is Open. Original cause of wound was Gradually Appeared. The wound is located on the Left,Proximal,Lateral Lower Leg. The wound measures 2.3cm length x 2.1cm width x 0.1cm depth; 3.793cm^2 area and 0.379cm^3 volume. There is Fat Layer (Subcutaneous Tissue) Exposed exposed. There is no tunneling or undermining noted. There is a large amount of purulent drainage noted. The wound margin is indistinct and nonvisible. There is medium (34-66%) pink granulation within the wound bed. There is a medium (34-66%) amount of necrotic tissue within the wound bed including Adherent Slough. Wound #8 status is Open. Original cause of wound was Gradually Appeared. The wound is located on the Left,Lateral Lower Leg. The wound measures 1.6cm length x 1.9cm width x 0.1cm depth; 2.388cm^2 area and 0.239cm^3 volume. There is Fat Layer (Subcutaneous Tissue) Exposed exposed. There is no tunneling or undermining noted. There is a large amount of serous drainage noted. The wound margin is indistinct and nonvisible. There is large (67-100%) pink granulation within the wound bed. There is a small (1-33%) amount of necrotic tissue within the wound bed including Adherent Slough. Wound #9 status is Open. Original cause of wound was Gradually Appeared. The wound is located on the Left,Lateral,Posterior Lower Leg. The wound measures 0.9cm length x 0.8cm width x 0.1cm depth; 0.565cm^2 area and 0.057cm^3 volume. There is Fat Layer (Subcutaneous Tissue) Exposed exposed. There is no tunneling or undermining noted. There is a  large amount of serous drainage noted. The wound margin is indistinct and nonvisible. There is large (67- 100%) pink granulation within the wound bed. There is no necrotic tissue within the wound bed. Assessment Active Problems TAIYLOR, KAYAL ([id number]) ICD-10 Non-pressure chronic ulcer of left calf limited to breakdown of skin Chronic venous hypertension (idiopathic) with inflammation of right lower extremity Lymphedema, not elsewhere classified Procedures Wound #5 Pre-procedure diagnosis of Wound #5 is a Lymphedema located on the Left,Medial Lower Leg . There was a Excisional Skin/Subcutaneous Tissue Debridement with a total area of 9.3 sq cm performed by [name] Dillon, MD. With the following instrument(s): Curette to remove Viable and Non-Viable tissue/material. Material removed includes Subcutaneous Tissue and Biofilm and after achieving pain control using Lidocaine. No specimens were taken. A time out was conducted at 13:33, prior to the start of the procedure. A Moderate amount of bleeding was controlled with Pressure. The procedure was tolerated well. Post Debridement Measurements: 3cm length x 3.1cm width x 0.1cm depth; 0.73cm^3 volume. Character of Wound/Ulcer Post Debridement is stable. Post procedure Diagnosis Wound #5: Same as Pre-Procedure Wound #6 Pre-procedure diagnosis of Wound #6 is a Venous Leg Ulcer located on the Left,Distal,Lateral Lower Leg. A skin graft procedure using a bioengineered skin substitute/cellular or tissue based product was performed by [name] Dillon, MD with the following instrument(s): Forceps. Apligraf was applied and secured with Steri-Strips. 44 sq cm of product was utilized  and 0 sq cm was wasted. Post Application, mepitel one was applied. A Time Out was conducted at 13:40, prior to the start of the procedure. The procedure was tolerated well with a pain level of 0 throughout and a pain level of 0 following the procedure. Post procedure  Diagnosis Wound #6: Same as Pre-Procedure . Plan Wound Cleansing: Wound #5 Left,Medial Lower Leg: Clean wound with Normal Saline. May Shower, gently pat wound dry prior to applying new dressing. Wound #6 Left,Distal,Lateral Lower Leg: Clean wound with Normal Saline. May Shower, gently pat wound dry prior to applying new dressing. Wound #7 Left,Proximal,Lateral Lower Leg: Clean wound with Normal Saline. May Shower, gently pat wound dry prior to applying new dressing. Wound #8 Left,Lateral Lower Leg: Clean wound with Normal Saline. May Shower, gently pat wound dry prior to applying new dressing. Wound #9 Left,Lateral,Posterior Lower Leg: Clean wound with Normal Saline. May Shower, gently pat wound dry prior to applying new dressing. Anesthetic (add to Medication List): Wound #5 Left,Medial Lower Leg: Topical Lidocaine 4% cream applied to wound bed prior to debridement (In Clinic Only). BRANTLEE, ZEIER ([id number]) Wound #6 Left,Distal,Lateral Lower Leg: Topical Lidocaine 4% cream applied to wound bed prior to debridement (In Clinic Only). Wound #7 Left,Proximal,Lateral Lower Leg: Topical Lidocaine 4% cream applied to wound bed prior to debridement (In Clinic Only). Wound #8 Left,Lateral Lower Leg: Topical Lidocaine 4% cream applied to wound bed prior to debridement (In Clinic Only). Wound #9 Left,Lateral,Posterior Lower Leg: Topical Lidocaine 4% cream applied to wound bed prior to debridement (In Clinic Only). Skin Barriers/Peri-Wound Care: Wound #5 Left,Medial Lower Leg: Barrier cream - Zinc oxide Other: - Antibiotic ointment on irritated areas Wound #6 Left,Distal,Lateral Lower Leg: Barrier cream - Zinc oxide Other: - Antibiotic ointment on irritated areas Wound #7 Left,Proximal,Lateral Lower Leg: Barrier cream - Zinc oxide Other: - Antibiotic ointment on irritated areas Wound #8 Left,Lateral Lower Leg: Barrier cream - Zinc oxide Other: - Antibiotic ointment on irritated  areas Wound #9 Left,Lateral,Posterior Lower Leg: Barrier cream - Zinc oxide Other: - Antibiotic ointment on irritated areas Primary Wound Dressing: Wound #7 Left,Proximal,Lateral Lower Leg: Silver Alginate Wound #8 Left,Lateral Lower Leg: Silver Alginate Wound #9 Left,Lateral,Posterior Lower Leg: Silver Alginate Secondary Dressing: Wound #5 Left,Medial Lower Leg: ABD pad Wound #6 Left,Distal,Lateral Lower Leg: ABD pad Wound #7 Left,Proximal,Lateral Lower Leg: ABD pad Wound #8 Left,Lateral Lower Leg: ABD pad Wound #9 Left,Lateral,Posterior Lower Leg: ABD pad Dressing Change Frequency: Wound #5 Left,Medial Lower Leg: Change dressing every week Wound #6 Left,Distal,Lateral Lower Leg: Change dressing every week Wound #7 Left,Proximal,Lateral Lower Leg: Change dressing every week Wound #8 Left,Lateral Lower Leg: Change dressing every week Wound #9 Left,Lateral,Posterior Lower Leg: Change dressing every week Follow-up Appointments: Wound #5 Left,Medial Lower Leg: Return Appointment in 1 week. Nurse Visit as needed Wound #6 Left,Distal,Lateral Lower Leg: Return Appointment in 1 week. Nurse Visit as needed TAMIYAH, DUSEL ([id number]) Wound #7 Left,Proximal,Lateral Lower Leg: Return Appointment in 1 week. Nurse Visit as needed Wound #8 Left,Lateral Lower Leg: Return Appointment in 1 week. Nurse Visit as needed Wound #9 Left,Lateral,Posterior Lower Leg: Return Appointment in 1 week. Nurse Visit as needed Edema Control: Wound #5 Left,Medial Lower Leg: 3 Layer Compression System - Left Lower Extremity Wound #6 Left,Distal,Lateral Lower Leg: 3 Layer Compression System - Left Lower Extremity Wound #7 Left,Proximal,Lateral Lower Leg: 3 Layer Compression System - Left Lower Extremity Wound #8 Left,Lateral Lower Leg: 3 Layer Compression System - Left Lower Extremity Wound #9 Left,Lateral,Posterior  Lower Leg: 3 Layer Compression System - Left Lower  Extremity Off-Loading: Wound #5 Left,Medial Lower Leg: Other: - Elevate legs as needed Wound #6 Left,Distal,Lateral Lower Leg: Other: - Elevate legs as needed Wound #7 Left,Proximal,Lateral Lower Leg: Other: - Elevate legs as needed Wound #8 Left,Lateral Lower Leg: Other: - Elevate legs as needed Wound #9 Left,Lateral,Posterior Lower Leg: Other: - Elevate legs as needed Advanced Therapies: Wound #5 Left,Medial Lower Leg: Apligraf application in clinic; including contact layer, fixation with steri strips, dry gauze and cover dressing. Wound #6 Left,Distal,Lateral Lower Leg: Apligraf application in clinic; including contact layer, fixation with steri strips, dry gauze and cover dressing. 1. I applied Apligraf #2 2. Silver alginate to the presumed wrap injury areas. 3. Absolutely no improvement in this 4. I have asked her to follow-up with vein and vascular Electronic Signature(s) Signed: 07/08/2019 5:28:46 PM By: Linton Ham MD Entered By: Linton Ham on 07/08/2019 14:07:07 Langlinais, [name] -------------------------------------------------------------------------------- SuperBill Details Patient Name: ALESHIA, HILDENBRANDT. Date of Service: 07/08/2019 Medical Record Number: [id number] Patient Account Number: [id number] Date of Birth/Sex: Apr 02, 1946 (73 y.o. F) Treating RN: Cornell Barman Primary Care Provider: [name] Other Clinician: Referring Provider: [name] Treating Provider/Extender: Tito Dine in Treatment: 30 Diagnosis Coding ICD-10 Codes Code Description [phone] Non-pressure chronic ulcer of left calf limited to breakdown of skin I87.321 Chronic venous hypertension (idiopathic) with inflammation of right lower extremity I89.0 Lymphedema, not elsewhere classified Facility Procedures CPT4 Code: [id number] Description: [phone] (Facility Use Only) Apligraf 44 SQ CM Modifier: Quantity: [place] Code: [id number] Description: [id number] - SKIN  SUB GRAFT TRNK/ARM/LEG ICD-10 Diagnosis Description L97.221 Non-pressure chronic ulcer of left calf limited to breakdown Modifier: of skin Quantity: 1 CPT4 Code: [id number] Description: [id number] - DEB SUBQ TISSUE 20 SQ CM/< ICD-10 Diagnosis Description L97.221 Non-pressure chronic ulcer of left calf limited to breakdown Modifier: of skin Quantity: 1 Physician Procedures CPT4 Code: [id number] Description: [id number] - WC PHYS SKIN SUB GRAFT TRNK/ARM/LEG ICD-10 Diagnosis Description L97.221 Non-pressure chronic ulcer of left calf limited to breakdown o Modifier: f skin Quantity: 1 CPT4 Code: [id number] Description: [id number] - WC PHYS SUBQ TISS 20 SQ CM ICD-10 Diagnosis Description L97.221 Non-pressure chronic ulcer of left calf limited to breakdown o Modifier: f skin Quantity: 1 Electronic Signature(s) Signed: 07/08/2019 5:28:46 PM By: Linton Ham MD Entered By: Linton Ham on 07/08/2019 14:07:32

## 2019-07-10 NOTE — Progress Notes (Signed)
TARAOLUWA, ENTZ ([id number]) Visit Report for 07/08/2019 Arrival Information Details Patient Name: [name], [name]. Date of Service: 07/08/2019 12:45 PM Medical Record Number: [id number] Patient Account Number: [id number] Date of Birth/Sex: November 29, 1945 (73 y.o. F) Treating RN: Cornell Barman Primary Care Cressie Betzler: [name] Other Clinician: Referring Burleigh Brockmann: [name] Treating Judy Pollman/Extender: Tito Dine in Treatment: 63 Visit Information History Since Last Visit Added or deleted any medications: No Patient Arrived: Ambulatory Any new allergies or adverse reactions: No Arrival Time: 12:53 Had a fall or experienced change in No Accompanied By: self activities of daily living that may affect Transfer Assistance: None risk of falls: Patient Identification Verified: Yes Signs or symptoms of abuse/neglect since last visito No Secondary Verification Process Completed: Yes Hospitalized since last visit: No Implantable device outside of the clinic excluding No cellular tissue based products placed in the center since last visit: Has Dressing in Place as Prescribed: Yes Has Compression in Place as Prescribed: Yes Pain Present Now: No Electronic Signature(s) Signed: 07/08/2019 4:27:13 PM By: Lorine Bears RCP, RRT, CHT Entered By: Lorine Bears on 07/08/2019 12:54:11 Broaddus, [name] ([id number]) -------------------------------------------------------------------------------- Encounter Discharge Information Details Patient Name: Amber, [name]. Date of Service: 07/08/2019 12:45 PM Medical Record Number: [id number] Patient Account Number: [id number] Date of Birth/Sex: 1945-11-22 (73 y.o. F) Treating RN: Harold Barban Primary Care Adriane Guglielmo: [name] Other Clinician: Referring Lexie Morini: [name] Treating Dacoda Spallone/Extender: Tito Dine in Treatment: 30 Encounter Discharge Information Items Post Procedure  Vitals Discharge Condition: Stable Temperature (F): 98.4 Ambulatory Status: Ambulatory Pulse (bpm): 88 Discharge Destination: Home Respiratory Rate (breaths/min): 16 Transportation: Private Auto Blood Pressure (mmHg): 131/68 Accompanied By: self Schedule Follow-up Appointment: Yes Clinical Summary of Care: Electronic Signature(s) Signed: 07/08/2019 4:31:02 PM By: Harold Barban Entered By: Harold Barban on 07/08/2019 14:03:33 Gracey, [name] ([id number]) -------------------------------------------------------------------------------- Lower Extremity Assessment Details Patient Name: Amber, [name]. Date of Service: 07/08/2019 12:45 PM Medical Record Number: [id number] Patient Account Number: [id number] Date of Birth/Sex: 1945/11/23 (73 y.o. F) Treating RN: Montey Hora Primary Care Eulalah Rupert: [name] Other Clinician: Referring Eilan Mcinerny: [name] Treating Kinnick Maus/Extender: Tito Dine in Treatment: 30 Edema Assessment Assessed: [Left: No] [Right: No] Edema: [Left: Ye] [Right: s] Calf Left: Right: Point of Measurement: 31 cm From Medial Instep 43 cm cm Ankle Left: Right: Point of Measurement: 12 cm From Medial Instep 23.8 cm cm Vascular Assessment Pulses: Dorsalis Pedis Palpable: [Left:Yes] Electronic Signature(s) Signed: 07/08/2019 4:15:15 PM By: Montey Hora Entered By: Montey Hora on 07/08/2019 12:59:34 Littlepage, [name] ([id number]) -------------------------------------------------------------------------------- Multi Wound Chart Details Patient Name: Amber, [name]. Date of Service: 07/08/2019 12:45 PM Medical Record Number: [id number] Patient Account Number: [id number] Date of Birth/Sex: 07/08/1946 (73 y.o. F) Treating RN: Cornell Barman Primary Care Tuesday Terlecki: [name] Other Clinician: Referring Adira Limburg: [name] Treating Shanikqua Zarzycki/Extender: Tito Dine in Treatment: 30 Vital Signs Height(in):  63 Pulse(bpm): 61 Weight(lbs): 224.7 Blood Pressure(mmHg): 131/68 Body Mass Index(BMI): 40 Temperature(F): 98.4 Respiratory Rate 16 (breaths/min): Photos: Wound Location: Left Lower Leg - Medial Left Lower Leg - Lateral, Distal Left Lower Leg - Lateral, Proximal Wounding Event: Gradually Appeared Gradually Appeared Gradually Appeared Primary Etiology: Lymphedema Venous Leg Ulcer Venous Leg Ulcer Comorbid History: Cataracts, Asthma, Sleep Cataracts, Asthma, Sleep Cataracts, Asthma, Sleep Apnea, Deep Vein Apnea, Deep Vein Apnea, Deep Vein Thrombosis, Hypertension, Thrombosis, Hypertension, Thrombosis, Hypertension, Peripheral Venous Disease, Peripheral Venous Disease, Peripheral Venous Disease, Osteoarthritis, Received Osteoarthritis, Received Osteoarthritis, Received Chemotherapy, Received Chemotherapy, Received Chemotherapy, Received Radiation Radiation  Radiation Date Acquired: 11/19/2018 01/19/2019 07/08/2019 Weeks of Treatment: 30 24 0 Wound Status: Open Open Open Measurements L x W x D 3x3.1x0.1 3.4x3.9x0.3 2.3x2.1x0.1 (cm) Area (cm) : 7.304 10.414 3.793 Volume (cm) : 0.73 3.124 0.379 % Reduction in Area: 14.50% -4633.60% N/A % Reduction in Volume: 14.60% -14100.00% N/A Classification: Full Thickness Without Full Thickness Without Full Thickness Without Exposed Support Structures Exposed Support Structures Exposed Support Structures Exudate Amount: Large Large Large Exudate Type: Purulent Purulent Purulent Exudate Color: yellow, brown, green yellow, brown, green yellow, brown, green Wound Margin: Indistinct, nonvisible Indistinct, nonvisible Indistinct, nonvisible Granulation Amount: Large (67-100%) Small (1-33%) Medium (34-66%) Granulation Quality: Pink, Hyper-granulation Pink Pink Necrotic Amount: Small (1-33%) Large (67-100%) Medium (34-66%) [name], [name] ([id number]) Exposed Structures: Fat Layer (Subcutaneous Fat Layer (Subcutaneous Fat Layer (Subcutaneous Tissue)  Exposed: Yes Tissue) Exposed: Yes Tissue) Exposed: Yes Fascia: No Fascia: No Fascia: No Tendon: No Tendon: No Tendon: No Muscle: No Muscle: No Muscle: No Joint: No Joint: No Joint: No Bone: No Bone: No Bone: No Epithelialization: None None None Debridement: Debridement - Excisional N/A N/A Pre-procedure 13:33 N/A N/A Verification/Time Out Taken: Pain Control: Lidocaine N/A N/A Tissue Debrided: Subcutaneous N/A N/A Level: Skin/Subcutaneous Tissue N/A N/A Debridement Area (sq cm): 9.3 N/A N/A Instrument: Curette N/A N/A Bleeding: Moderate N/A N/A Hemostasis Achieved: Pressure N/A N/A Debridement Treatment Procedure was tolerated well N/A N/A Response: Post Debridement 3x3.1x0.1 N/A N/A Measurements L x W x D (cm) Post Debridement Volume: 0.73 N/A N/A (cm) Procedures Performed: Debridement Cellular or Tissue Based N/A Product Wound Number: [id number] N/A Photos: N/A Wound Location: Left Lower Leg - Lateral Left Lower Leg - Lateral, N/A Posterior Wounding Event: Gradually Appeared Gradually Appeared N/A Primary Etiology: Venous Leg Ulcer Venous Leg Ulcer N/A Comorbid History: Cataracts, Asthma, Sleep Cataracts, Asthma, Sleep N/A Apnea, Deep Vein Apnea, Deep Vein Thrombosis, Hypertension, Thrombosis, Hypertension, Peripheral Venous Disease, Peripheral Venous Disease, Osteoarthritis, Received Osteoarthritis, Received Chemotherapy, Received Chemotherapy, Received Radiation Radiation Date Acquired: 07/08/2019 07/08/2019 N/A Weeks of Treatment: 0 0 N/A Wound Status: Open Open N/A Measurements L x W x D 1.6x1.9x0.1 0.9x0.8x0.1 N/A (cm) Area (cm) : 2.388 0.565 N/A Volume (cm) : 0.239 0.057 N/A % Reduction in Area: N/A N/A N/A % Reduction in Volume: N/A N/A N/A CONESHA, ANTONIOU ([id number]) Classification: Full Thickness Without Full Thickness Without N/A Exposed Support Structures Exposed Support Structures Exudate Amount: Large Large N/A Exudate Type: Serous Serous  N/A Exudate Color: amber amber N/A Wound Margin: Indistinct, nonvisible Indistinct, nonvisible N/A Granulation Amount: Large (67-100%) Large (67-100%) N/A Granulation Quality: Pink Pink N/A Necrotic Amount: Small (1-33%) None Present (0%) N/A Exposed Structures: Fat Layer (Subcutaneous Fat Layer (Subcutaneous N/A Tissue) Exposed: Yes Tissue) Exposed: Yes Fascia: No Fascia: No Tendon: No Tendon: No Muscle: No Muscle: No Joint: No Joint: No Bone: No Bone: No Epithelialization: Small (1-33%) None N/A Debridement: N/A N/A N/A Pain Control: N/A N/A N/A Tissue Debrided: N/A N/A N/A Level: N/A N/A N/A Debridement Area (sq cm): N/A N/A N/A Instrument: N/A N/A N/A Bleeding: N/A N/A N/A Hemostasis Achieved: N/A N/A N/A Debridement Treatment N/A N/A N/A Response: Post Debridement N/A N/A N/A Measurements L x W x D (cm) Post Debridement Volume: N/A N/A N/A (cm) Procedures Performed: N/A N/A N/A Treatment Notes Electronic Signature(s) Signed: 07/08/2019 5:28:46 PM By: Linton Ham MD Entered By: Linton Ham on 07/08/2019 13:59:21 Rolston, [name] -------------------------------------------------------------------------------- Multi-Disciplinary Care Plan Details Patient Name: RYCHELLE, MUTCHLER. Date of Service: 07/08/2019 12:45 PM Medical  Record Number: [id number] Patient Account Number: [id number] Date of Birth/Sex: 09/18/1946 (73 y.o. F) Treating RN: Cornell Barman Primary Care Tamyia Minich: [name] Other Clinician: Referring Brandace Cargle: [name] Treating Vash Quezada/Extender: Tito Dine in Treatment: 30 Active Inactive Orientation to the Wound Care Program Nursing Diagnoses: Knowledge deficit related to the wound healing center program Goals: Patient/caregiver will verbalize understanding of the [facility] Date Initiated: 12/10/2018 Target Resolution Date: 01/09/2019 Goal Status: Active Interventions: Provide education on  orientation to the wound center Notes: Soft Tissue Infection Nursing Diagnoses: Impaired tissue integrity Goals: Patient's soft tissue infection will resolve Date Initiated: 12/10/2018 Target Resolution Date: 01/09/2019 Goal Status: Active Interventions: Assess signs and symptoms of infection every visit Notes: Venous Leg Ulcer Nursing Diagnoses: Actual venous Insuffiency (use after diagnosis is confirmed) Goals: Patient will maintain optimal edema control Date Initiated: 12/10/2018 Target Resolution Date: 01/09/2019 Goal Status: Active Interventions: Assess peripheral edema status every visit. ALLIENE, ASHWORTH ([id number]) Treatment Activities: Therapeutic compression applied : 12/10/2018 Notes: Wound/Skin Impairment Nursing Diagnoses: Impaired tissue integrity Goals: Patient/caregiver will verbalize understanding of skin care regimen Date Initiated: 12/10/2018 Target Resolution Date: 01/09/2019 Goal Status: Active Interventions: Assess ulceration(s) every visit Treatment Activities: Topical wound management initiated : 12/10/2018 Notes: Electronic Signature(s) Signed: 07/10/2019 10:04:45 AM By: [name], [name], RN, CWS, [name], [name] Entered By: [name], [name], RN, CWS, Kim on 07/08/2019 13:32:47 Messina, [name] -------------------------------------------------------------------------------- Pain Assessment Details Patient Name: CARMELLIA, MERLIN. Date of Service: 07/08/2019 12:45 PM Medical Record Number: [id number] Patient Account Number: [id number] Date of Birth/Sex: Feb 12, 1946 (73 y.o. F) Treating RN: Cornell Barman Primary Care Mai Longnecker: [name] Other Clinician: Referring Denica Web: [name] Treating Sanaiyah Kirchhoff/Extender: Tito Dine in Treatment: 30 Active Problems Location of Pain Severity and Description of Pain Patient Has Paino No Site Locations Pain Management and Medication Current Pain Management: Electronic Signature(s) Signed: 07/08/2019  4:27:13 PM By: Lorine Bears RCP, RRT, CHT Signed: 07/10/2019 10:04:45 AM By: [name], [name], RN, CWS, [name], [name] Entered By: Lorine Bears on 07/08/2019 12:54:18 KERSHA, HAINER ([id number]) -------------------------------------------------------------------------------- Patient/Caregiver Education Details Patient Name: NAIYA, MORAIS. Date of Service: 07/08/2019 12:45 PM Medical Record Number: [id number] Patient Account Number: [id number] Date of Birth/Gender: 05-05-1946 (73 y.o. F) Treating RN: Cornell Barman Primary Care Physician: [name] Other Clinician: Referring Physician: [name] Treating Physician/Extender: Tito Dine in Treatment: 30 Education Assessment Education Provided To: Patient Education Topics Provided Wound Debridement: Handouts: Wound Debridement Methods: Demonstration, Explain/Verbal Responses: State content correctly Wound/Skin Impairment: Handouts: Caring for Your Ulcer Methods: Demonstration, Explain/Verbal Responses: State content correctly Electronic Signature(s) Signed: 07/10/2019 10:04:45 AM By: [name], [name], RN, CWS, [name], [name] Entered By: [name], [name], RN, CWS, Kim on 07/08/2019 13:45:39 Pohlman, [name] -------------------------------------------------------------------------------- Wound Assessment Details Patient Name: DAEJHA, HEMMING. Date of Service: 07/08/2019 12:45 PM Medical Record Number: [id number] Patient Account Number: [id number] Date of Birth/Sex: October 09, 1946 (73 y.o. F) Treating RN: Montey Hora Primary Care Haylee Mcanany: [name] Other Clinician: Referring Hendel Gatliff: [name] Treating Sheriden Archibeque/Extender: Tito Dine in Treatment: 30 Wound Status Wound Number: 5 Primary Lymphedema Etiology: Wound Location: Left Lower Leg - Medial Wound Open Wounding Event: Gradually Appeared Status: Date Acquired: 11/19/2018 Comorbid Cataracts, Asthma, Sleep  Apnea, Deep Vein Weeks Of Treatment: 30 History: Thrombosis, Hypertension, Peripheral Venous Clustered Wound: No Disease, Osteoarthritis, Received Chemotherapy, Received Radiation Photos Wound Measurements Length: (cm) 3 Width: (cm) 3.1 Depth: (cm) 0.1 Area: (cm) 7.304 Volume: (cm) 0.73 % Reduction in Area:  14.5% % Reduction in Volume: 14.6% Epithelialization: None Tunneling: No Undermining: No Wound Description Full Thickness Without Exposed Support Classification: Structures Wound Margin: Indistinct, nonvisible Exudate Large Amount: Exudate Type: Purulent Exudate Color: yellow, brown, green Foul Odor After Cleansing: No Slough/Fibrino Yes Wound Bed Granulation Amount: Large (67-100%) Exposed Structure Granulation Quality: Pink, Hyper-granulation Fascia Exposed: No Necrotic Amount: Small (1-33%) Fat Layer (Subcutaneous Tissue) Exposed: Yes Necrotic Quality: Adherent Slough Tendon Exposed: No Muscle Exposed: No Joint Exposed: No Bone Exposed: No Mcmath, Hagar J. ([id number]) Treatment Notes Wound #5 (Left, Medial Lower Leg) Notes Apilgraft, mepitel, steri strips, Scell, ABD, 3Layer-Left Electronic Signature(s) Signed: 07/08/2019 4:15:15 PM By: Montey Hora Entered By: Montey Hora on 07/08/2019 13:07:57 Vandevender, [name] -------------------------------------------------------------------------------- Wound Assessment Details Patient Name: MAKAHLA, JOBIN. Date of Service: 07/08/2019 12:45 PM Medical Record Number: [id number] Patient Account Number: [id number] Date of Birth/Sex: [birth year] (73 y.o. F) Treating RN: Montey Hora Primary Care Wilmon Conover: [name] Other Clinician: Referring Sunday Klos: [name] Treating Kallie Depolo/Extender: Tito Dine in Treatment: 30 Wound Status Wound Number: 6 Primary Venous Leg Ulcer Etiology: Wound Location: Left Lower Leg - Lateral, Distal Wound Open Wounding Event: Gradually  Appeared Status: Date Acquired: 01/19/2019 Comorbid Cataracts, Asthma, Sleep Apnea, Deep Vein Weeks Of Treatment: 24 History: Thrombosis, Hypertension, Peripheral Venous Clustered Wound: No Disease, Osteoarthritis, Received Chemotherapy, Received Radiation Photos Wound Measurements Length: (cm) 3.4 Width: (cm) 3.9 Depth: (cm) 0.3 Area: (cm) 10.414 Volume: (cm) 3.124 % Reduction in Area: -4633.6% % Reduction in Volume: -14100% Epithelialization: None Tunneling: No Undermining: No Wound Description Full Thickness Without Exposed Support Foul O Classification: Structures Slough Wound Margin: Indistinct, nonvisible Exudate Large Amount: Exudate Type: Purulent Exudate Color: yellow, brown, green dor After Cleansing: No /Fibrino Yes Wound Bed Granulation Amount: Small (1-33%) Exposed Structure Granulation Quality: Pink Fascia Exposed: No Necrotic Amount: Large (67-100%) Fat Layer (Subcutaneous Tissue) Exposed: Yes Necrotic Quality: Adherent Slough Tendon Exposed: No Muscle Exposed: No Joint Exposed: No Bone Exposed: No Guaman, Robyne J. ([id number]) Treatment Notes Wound #6 (Left, Distal, Lateral Lower Leg) Notes Apilgraft, mepitel, steri strips, Scell, ABD, 3Layer-Left Electronic Signature(s) Signed: 07/08/2019 4:15:15 PM By: Montey Hora Entered By: Montey Hora on 07/08/2019 13:08:30 Eckard, [name] -------------------------------------------------------------------------------- Wound Assessment Details Patient Name: DOREENE, NAGEOTTE. Date of Service: 07/08/2019 12:45 PM Medical Record Number: [id number] Patient Account Number: [id number] Date of Birth/Sex: [birth year] (73 y.o. F) Treating RN: Montey Hora Primary Care Todrick Siedschlag: [name] Other Clinician: Referring Shonia Skilling: [name] Treating Dempsey Knotek/Extender: Tito Dine in Treatment: 30 Wound Status Wound Number: 7 Primary Venous Leg Ulcer Etiology: Wound  Location: Left Lower Leg - Lateral, Proximal Wound Open Wounding Event: Gradually Appeared Status: Date Acquired: 07/08/2019 Comorbid Cataracts, Asthma, Sleep Apnea, Deep Vein Weeks Of Treatment: 0 History: Thrombosis, Hypertension, Peripheral Venous Clustered Wound: No Disease, Osteoarthritis, Received Chemotherapy, Received Radiation Photos Wound Measurements Length: (cm) 2.3 Width: (cm) 2.1 Depth: (cm) 0.1 Area: (cm) 3.793 Volume: (cm) 0.379 % Reduction in Area: % Reduction in Volume: Epithelialization: None Tunneling: No Undermining: No Wound Description Full Thickness Without Exposed Support Classification: Structures Wound Margin: Indistinct, nonvisible Exudate Large Amount: Exudate Type: Purulent Exudate Color: yellow, brown, green Foul Odor After Cleansing: No Slough/Fibrino Yes Wound Bed Granulation Amount: Medium (34-66%) Exposed Structure Granulation Quality: Pink Fascia Exposed: No Necrotic Amount: Medium (34-66%) Fat Layer (Subcutaneous Tissue) Exposed: Yes Necrotic Quality: Adherent Slough Tendon Exposed: No Muscle Exposed: No Joint Exposed: No Bone Exposed: No Flaming, Lauran J. ([id number]) Treatment Notes Wound #7 (Left,  Proximal, Lateral Lower Leg) Notes Apilgraft, mepitel, steri strips, Scell, ABD, 3Layer-Left Electronic Signature(s) Signed: 07/08/2019 4:15:15 PM By: Montey Hora Entered By: Montey Hora on 07/08/2019 13:11:59 Dubey, Brendi Lenna Sciara ([id number]) -------------------------------------------------------------------------------- Wound Assessment Details Patient Name: JOMAYRA, PISARCZYK. Date of Service: 07/08/2019 12:45 PM Medical Record Number: [id number] Patient Account Number: [id number] Date of Birth/Sex: 1946/01/14 (72 y.o. F) Treating RN: Montey Hora Primary Care Caydee Talkington: [name] Other Clinician: Referring Clemence Lengyel: [name] Treating Evea Sheek/Extender: Tito Dine in Treatment: 30 Wound  Status Wound Number: 8 Primary Venous Leg Ulcer Etiology: Wound Location: Left Lower Leg - Lateral Wound Open Wounding Event: Gradually Appeared Status: Date Acquired: 07/08/2019 Comorbid Cataracts, Asthma, Sleep Apnea, Deep Vein Weeks Of Treatment: 0 History: Thrombosis, Hypertension, Peripheral Venous Clustered Wound: No Disease, Osteoarthritis, Received Chemotherapy, Received Radiation Photos Wound Measurements Length: (cm) 1.6 Width: (cm) 1.9 Depth: (cm) 0.1 Area: (cm) 2.388 Volume: (cm) 0.239 % Reduction in Area: % Reduction in Volume: Epithelialization: Small (1-33%) Tunneling: No Undermining: No Wound Description Full Thickness Without Exposed Support Classification: Structures Wound Margin: Indistinct, nonvisible Exudate Large Amount: Exudate Type: Serous Exudate Color: amber Foul Odor After Cleansing: No Slough/Fibrino Yes Wound Bed Granulation Amount: Large (67-100%) Exposed Structure Granulation Quality: Pink Fascia Exposed: No Necrotic Amount: Small (1-33%) Fat Layer (Subcutaneous Tissue) Exposed: Yes Necrotic Quality: Adherent Slough Tendon Exposed: No Muscle Exposed: No Joint Exposed: No Bone Exposed: No Spillman, Julena J. ([id number]) Treatment Notes Wound #8 (Left, Lateral Lower Leg) Notes Apilgraft, mepitel, steri strips, Scell, ABD, 3Layer-Left Electronic Signature(s) Signed: 07/08/2019 4:15:15 PM By: Montey Hora Entered By: Montey Hora on 07/08/2019 13:13:27 Goltz, [name] ([id number]) -------------------------------------------------------------------------------- Wound Assessment Details Patient Name: TEELA, EGERT. Date of Service: 07/08/2019 12:45 PM Medical Record Number: [id number] Patient Account Number: [id number] Date of Birth/Sex: 1946-07-18 (73 y.o. F) Treating RN: Montey Hora Primary Care Sydna Brodowski: [name] Other Clinician: Referring Kesleigh Morson: [name] Treating Goldy Calandra/Extender: Tito Dine in Treatment: 30 Wound Status Wound Number: 9 Primary Venous Leg Ulcer Etiology: Wound Location: Left Lower Leg - Lateral, Posterior Wound Open Wounding Event: Gradually Appeared Status: Date Acquired: 07/08/2019 Comorbid Cataracts, Asthma, Sleep Apnea, Deep Vein Weeks Of Treatment: 0 History: Thrombosis, Hypertension, Peripheral Venous Clustered Wound: No Disease, Osteoarthritis, Received Chemotherapy, Received Radiation Photos Wound Measurements Length: (cm) 0.9 Width: (cm) 0.8 Depth: (cm) 0.1 Area: (cm) 0.565 Volume: (cm) 0.057 % Reduction in Area: % Reduction in Volume: Epithelialization: None Tunneling: No Undermining: No Wound Description Full Thickness Without Exposed Support Classification: Structures Wound Margin: Indistinct, nonvisible Exudate Large Amount: Exudate Type: Serous Exudate Color: amber Foul Odor After Cleansing: No Slough/Fibrino No Wound Bed Granulation Amount: Large (67-100%) Exposed Structure Granulation Quality: Pink Fascia Exposed: No Necrotic Amount: None Present (0%) Fat Layer (Subcutaneous Tissue) Exposed: Yes Tendon Exposed: No Muscle Exposed: No Joint Exposed: No Bone Exposed: No Slotnick, Keyani J. ([id number]) Treatment Notes Wound #9 (Left, Lateral, Posterior Lower Leg) Notes Apilgraft, mepitel, steri strips, Scell, ABD, 3Layer-Left Electronic Signature(s) Signed: 07/08/2019 4:15:15 PM By: Montey Hora Entered By: Montey Hora on 07/08/2019 13:15:00 Diamant, [name] ([id number]) -------------------------------------------------------------------------------- Vitals Details Patient Name: KARRA, ASCHOFF. Date of Service: 07/08/2019 12:45 PM Medical Record Number: [id number] Patient Account Number: [id number] Date of Birth/Sex: 29-Mar-1946 (73 y.o. F) Treating RN: Cornell Barman Primary Care Trinika Cortese: [name] Other Clinician: Referring Tayshon Winker: [name] Treating Sylvanus Telford/Extender: Tito Dine in Treatment: 30 Vital Signs Time Taken: 12:55 Temperature (F): 98.4 Height (in): 63 Pulse (bpm): 88 Weight (lbs): 224.7 Respiratory  Rate (breaths/min): 16 Body Mass Index (BMI): 39.8 Blood Pressure (mmHg): 131/68 Reference Range: 80 - 120 mg / dl Electronic Signature(s) Signed: 07/08/2019 4:27:13 PM By: Lorine Bears RCP, RRT, CHT Entered By: Lorine Bears on 07/08/2019 12:56:57

## 2019-07-15 ENCOUNTER — Other Ambulatory Visit: Payer: Self-pay

## 2019-07-15 DIAGNOSIS — I10 Essential (primary) hypertension: Secondary | ICD-10-CM | POA: Diagnosis not present

## 2019-07-15 DIAGNOSIS — L97221 Non-pressure chronic ulcer of left calf limited to breakdown of skin: Secondary | ICD-10-CM | POA: Diagnosis not present

## 2019-07-15 DIAGNOSIS — I890 Lymphedema, not elsewhere classified: Secondary | ICD-10-CM | POA: Diagnosis not present

## 2019-07-15 DIAGNOSIS — M1990 Unspecified osteoarthritis, unspecified site: Secondary | ICD-10-CM | POA: Diagnosis not present

## 2019-07-15 DIAGNOSIS — J45909 Unspecified asthma, uncomplicated: Secondary | ICD-10-CM | POA: Diagnosis not present

## 2019-07-15 DIAGNOSIS — G4730 Sleep apnea, unspecified: Secondary | ICD-10-CM | POA: Diagnosis not present

## 2019-07-15 NOTE — Progress Notes (Signed)
ALEYIA, LINEBERRY ([id number]) Visit Report for 07/15/2019 Arrival Information Details Patient Name: [name], [name]. Date of Service: 07/15/2019 12:45 PM Medical Record Number: [id number] Patient Account Number: [id number] Date of Birth/Sex: September 07, 1946 (73 y.o. F) Treating RN: Army Melia Primary Care Maricia Scotti: [name] Other Clinician: Referring Vermell Madrid: [name] Treating Asyria Kolander/Extender: Tito Dine in Treatment: 75 Visit Information History Since Last Visit Added or deleted any medications: No Patient Arrived: Ambulatory Any new allergies or adverse reactions: No Arrival Time: 12:54 Had a fall or experienced change in No Accompanied By: self activities of daily living that may affect Transfer Assistance: None risk of falls: Signs or symptoms of abuse/neglect since last visito No Hospitalized since last visit: No Has Dressing in Place as Prescribed: Yes Pain Present Now: No Electronic Signature(s) Signed: 07/15/2019 1:37:17 PM By: Army Melia Entered By: Army Melia on 07/15/2019 12:54:47 Monda, [name] -------------------------------------------------------------------------------- Compression Therapy Details Patient Name: [name], [name]. Date of Service: 07/15/2019 12:45 PM Medical Record Number: [id number] Patient Account Number: [id number] Date of Birth/Sex: [birth year] (73 y.o. F) Treating RN: Army Melia Primary Care Siennah Barrasso: [name] Other Clinician: Referring Tatelyn Vanhecke: [name] Treating Basilia Stuckert/Extender: Tito Dine in Treatment: 31 Compression Therapy Performed for Wound Assessment: Wound #5 Left,Medial Lower Leg Performed By: Clinician Army Melia, RN Compression Type: Three Layer Pre Treatment ABI: 1 Electronic Signature(s) Signed: 07/15/2019 1:37:17 PM By: Army Melia Entered By: Army Melia on 07/15/2019 13:17:58 Eichholz, [name]  ([id number]) -------------------------------------------------------------------------------- Compression Therapy Details Patient Name: [name], [name]. Date of Service: 07/15/2019 12:45 PM Medical Record Number: [id number] Patient Account Number: [id number] Date of Birth/Sex: 1946/02/24 (73 y.o. F) Treating RN: Army Melia Primary Care Darionna Banke: [name] Other Clinician: Referring Liron Eissler: [name] Treating Nusaiba Guallpa/Extender: Tito Dine in Treatment: 31 Compression Therapy Performed for Wound Assessment: Wound #8 Left,Lateral Lower Leg Performed By: Clinician Army Melia, RN Compression Type: Three Layer Pre Treatment ABI: 1 Electronic Signature(s) Signed: 07/15/2019 1:37:17 PM By: Army Melia Entered By: Army Melia on 07/15/2019 13:17:58 Vanrossum, [name] -------------------------------------------------------------------------------- Compression Therapy Details Patient Name: [name], [name]. Date of Service: 07/15/2019 12:45 PM Medical Record Number: [id number] Patient Account Number: [id number] Date of Birth/Sex: 1946/01/24 (73 y.o. F) Treating RN: Army Melia Primary Care [place] Dube: [name] Other Clinician: Referring Kejuan Bekker: [name] Treating Shaquanna Lycan/Extender: Tito Dine in Treatment: 31 Compression Therapy Performed for Wound Assessment: Wound #6 Left,Distal,Lateral Lower Leg Performed By: Clinician Army Melia, RN Compression Type: Three Layer Pre Treatment ABI: 1 Electronic Signature(s) Signed: 07/15/2019 1:37:17 PM By: Army Melia Entered By: Army Melia on 07/15/2019 13:17:58 Cullinane, [name] -------------------------------------------------------------------------------- Compression Therapy Details Patient Name: [name], [name]. Date of Service: 07/15/2019 12:45 PM Medical Record Number: [id number] Patient Account Number: [id number] Date of Birth/Sex: January 02, 1946 (73 y.o. F) Treating  RN: Army Melia Primary Care Delmer Kowalski: [name] Other Clinician: Referring Fanny Agan: [name] Treating Edward Trevino/Extender: Tito Dine in Treatment: 31 Compression Therapy Performed for Wound Assessment: Wound #7 Left,Proximal,Lateral Lower Leg Performed By: Clinician Army Melia, RN Compression Type: Three Layer Pre Treatment ABI: 1 Electronic Signature(s) Signed: 07/15/2019 1:37:17 PM By: Army Melia Entered By: Army Melia on 07/15/2019 13:17:58 Rew, [name] -------------------------------------------------------------------------------- Compression Therapy Details Patient Name: [name], [name]. Date of Service: 07/15/2019 12:45 PM Medical Record Number: [id number] Patient Account Number: [id number] Date of Birth/Sex: 07/17/1946 (73 y.o. F) Treating RN: Army Melia Primary Care Makhi Muzquiz: [name] Other Clinician: Referring Trevelle Mcgurn: [name] Treating Marrietta Thunder/Extender:  ROBSON, MICHAEL G Weeks in Treatment: 31 Compression Therapy Performed for Wound Assessment: Wound #9 Left,Lateral,Posterior Lower Leg Performed By: Clinician Army Melia, RN Compression Type: Three Layer Pre Treatment ABI: 1 Electronic Signature(s) Signed: 07/15/2019 1:37:17 PM By: Army Melia Entered By: Army Melia on 07/15/2019 13:17:59 Hart, [name] -------------------------------------------------------------------------------- Encounter Discharge Information Details Patient Name: [name], [name]. Date of Service: 07/15/2019 12:45 PM Medical Record Number: [id number] Patient Account Number: [id number] Date of Birth/Sex: 09-19-1946 (73 y.o. F) Treating RN: Army Melia Primary Care Jeweline Reif: [name] Other Clinician: Referring Dequann Vandervelden: [name] Treating Sevanna Ballengee/Extender: Tito Dine in Treatment: 10 Encounter Discharge Information Items Discharge Condition: Stable Ambulatory Status:  Ambulatory Discharge Destination: Home Transportation: Private Auto Accompanied By: self Schedule Follow-up Appointment: Yes Clinical Summary of Care: Electronic Signature(s) Signed: 07/15/2019 1:37:17 PM By: Army Melia Entered By: Army Melia on 07/15/2019 13:18:58 Arico, [name] -------------------------------------------------------------------------------- Wound Assessment Details Patient Name: [name], [name]. Date of Service: 07/15/2019 12:45 PM Medical Record Number: [id number] Patient Account Number: [id number] Date of Birth/Sex: 1946/10/28 (73 y.o. F) Treating RN: Army Melia Primary Care Virdia Ziesmer: [name] Other Clinician: Referring Macey Wurtz: [name] Treating Clarissa Laird/Extender: Tito Dine in Treatment: 31 Wound Status Wound Number: 5 Primary Lymphedema Etiology: Wound Location: Left Lower Leg - Medial Wound Open Wounding Event: Gradually Appeared Status: Date Acquired: 11/19/2018 Comorbid Cataracts, Asthma, Sleep Apnea, Deep Vein Weeks Of Treatment: 31 History: Thrombosis, Hypertension, Peripheral Venous Clustered Wound: No Disease, Osteoarthritis, Received Chemotherapy, Received Radiation Photos Wound Measurements Length: (cm) 5.3 Width: (cm) 3 Depth: (cm) 0.1 Area: (cm) 12.488 Volume: (cm) 1.249 % Reduction in Area: -46.1% % Reduction in Volume: -46.1% Epithelialization: None Tunneling: No Undermining: No Wound Description Full Thickness Without Exposed Support Classification: Structures Wound Margin: Indistinct, nonvisible Exudate Large Amount: Exudate Type: Purulent Exudate Color: yellow, brown, green Foul Odor After Cleansing: No Slough/Fibrino Yes Wound Bed Granulation Amount: Large (67-100%) Exposed Structure Granulation Quality: Pink, Hyper-granulation Fascia Exposed: No Necrotic Amount: Small (1-33%) Fat Layer (Subcutaneous Tissue) Exposed: Yes Necrotic Quality: Adherent Slough Tendon  Exposed: No Muscle Exposed: No Joint Exposed: No Bone Exposed: No Kasa, Sidonia J. ([id number]) Treatment Notes Wound #5 (Left, Medial Lower Leg) Notes sorbact, drawtex, ABD, 3 layer Electronic Signature(s) Signed: 07/15/2019 1:37:17 PM By: Army Melia Entered By: Army Melia on 07/15/2019 13:09:11 Koska, [name] -------------------------------------------------------------------------------- Wound Assessment Details Patient Name: [name], [name]. Date of Service: 07/15/2019 12:45 PM Medical Record Number: [id number] Patient Account Number: [id number] Date of Birth/Sex: 02-13-1946 (73 y.o. F) Treating RN: Army Melia Primary Care Elgie Landino: [name] Other Clinician: Referring D'Arcy Abraha: [name] Treating Heinz Eckert/Extender: Tito Dine in Treatment: 31 Wound Status Wound Number: 6 Primary Venous Leg Ulcer Etiology: Wound Location: Left Lower Leg - Lateral, Distal Wound Open Wounding Event: Gradually Appeared Status: Date Acquired: 01/19/2019 Comorbid Cataracts, Asthma, Sleep Apnea, Deep Vein Weeks Of Treatment: 25 History: Thrombosis, Hypertension, Peripheral Venous Clustered Wound: No Disease, Osteoarthritis, Received Chemotherapy, Received Radiation Photos Wound Measurements Length: (cm) 3.5 Width: (cm) 4 Depth: (cm) 0.2 Area: (cm) 10.996 Volume: (cm) 2.199 % Reduction in Area: -4898.2% % Reduction in Volume: -9895.5% Epithelialization: None Wound Description Full Thickness Without Exposed Support Foul O Classification: Structures Slough Wound Margin: Indistinct, nonvisible Exudate Large Amount: Exudate Type: Purulent Exudate Color: yellow, brown, green dor After Cleansing: No /Fibrino Yes Wound Bed Granulation Amount: Small (1-33%) Exposed Structure Granulation Quality: Pink Fascia Exposed: No Necrotic Amount: Large (67-100%) Fat Layer (Subcutaneous Tissue) Exposed: Yes Necrotic Quality:  Adherent Slough Tendon  Exposed: No Muscle Exposed: No Joint Exposed: No Bone Exposed: No Patteson, Verda J. ([id number]) Treatment Notes Wound #6 (Left, Distal, Lateral Lower Leg) Notes sorbact, drawtex, ABD, 3 layer Electronic Signature(s) Signed: 07/15/2019 1:37:17 PM By: Army Melia Entered By: Army Melia on 07/15/2019 13:09:55 Hymon, [name] ([id number]) -------------------------------------------------------------------------------- Wound Assessment Details Patient Name: [name], [name]. Date of Service: 07/15/2019 12:45 PM Medical Record Number: [id number] Patient Account Number: [id number] Date of Birth/Sex: 1946-02-20 (73 y.o. F) Treating RN: Army Melia Primary Care Nakul Avino: [name] Other Clinician: Referring Somaly Marteney: [name] Treating Payson Crumby/Extender: Tito Dine in Treatment: 31 Wound Status Wound Number: 7 Primary Venous Leg Ulcer Etiology: Wound Location: Left Lower Leg - Lateral, Proximal Wound Open Wounding Event: Gradually Appeared Status: Date Acquired: 07/08/2019 Comorbid Cataracts, Asthma, Sleep Apnea, Deep Vein Weeks Of Treatment: 1 History: Thrombosis, Hypertension, Peripheral Venous Clustered Wound: No Disease, Osteoarthritis, Received Chemotherapy, Received Radiation Photos Wound Measurements Length: (cm) 3 Width: (cm) 2 Depth: (cm) 0.1 Area: (cm) 4.712 Volume: (cm) 0.471 % Reduction in Area: -24.2% % Reduction in Volume: -24.3% Epithelialization: None Tunneling: No Undermining: No Wound Description Full Thickness Without Exposed Support Classification: Structures Wound Margin: Indistinct, nonvisible Exudate Large Amount: Exudate Type: Purulent Exudate Color: yellow, brown, green Foul Odor After Cleansing: No Slough/Fibrino Yes Wound Bed Granulation Amount: Medium (34-66%) Exposed Structure Granulation Quality: Pink Fascia Exposed: No Necrotic Amount: Medium (34-66%) Fat Layer (Subcutaneous Tissue) Exposed: Yes Necrotic  Quality: Adherent Slough Tendon Exposed: No Muscle Exposed: No Joint Exposed: No Bone Exposed: No Roderick, Savahna J. ([id number]) Treatment Notes Wound #7 (Left, Proximal, Lateral Lower Leg) Notes sorbact, drawtex, ABD, 3 layer Electronic Signature(s) Signed: 07/15/2019 1:37:17 PM By: Army Melia Entered By: Army Melia on 07/15/2019 13:10:20 Chovan, [name] ([id number]) -------------------------------------------------------------------------------- Wound Assessment Details Patient Name: [name], [name]. Date of Service: 07/15/2019 12:45 PM Medical Record Number: [id number] Patient Account Number: [id number] Date of Birth/Sex: 19-Jun-1946 (72 y.o. F) Treating RN: Army Melia Primary Care Romie Keeble: [name] Other Clinician: Referring Isaly Fasching: [name] Treating Jen Eppinger/Extender: Tito Dine in Treatment: 31 Wound Status Wound Number: 8 Primary Venous Leg Ulcer Etiology: Wound Location: Left Lower Leg - Lateral Wound Open Wounding Event: Gradually Appeared Status: Date Acquired: 07/08/2019 Comorbid Cataracts, Asthma, Sleep Apnea, Deep Vein Weeks Of Treatment: 1 History: Thrombosis, Hypertension, Peripheral Venous Clustered Wound: No Disease, Osteoarthritis, Received Chemotherapy, Received Radiation Photos Wound Measurements Length: (cm) 2 Width: (cm) 2 Depth: (cm) 0.1 Area: (cm) 3.142 Volume: (cm) 0.314 % Reduction in Area: -31.6% % Reduction in Volume: -31.4% Epithelialization: Small (1-33%) Wound Description Full Thickness Without Exposed Support Classification: Structures Wound Margin: Indistinct, nonvisible Exudate Large Amount: Exudate Type: Serous Exudate Color: amber Foul Odor After Cleansing: No Slough/Fibrino Yes Wound Bed Granulation Amount: Large (67-100%) Exposed Structure Granulation Quality: Pink Fascia Exposed: No Necrotic Amount: Small (1-33%) Fat Layer (Subcutaneous Tissue) Exposed: Yes Necrotic Quality:  Adherent Slough Tendon Exposed: No Muscle Exposed: No Joint Exposed: No Bone Exposed: No Obey, Deztiny J. ([id number]) Treatment Notes Wound #8 (Left, Lateral Lower Leg) Notes sorbact, drawtex, ABD, 3 layer Electronic Signature(s) Signed: 07/15/2019 1:37:17 PM By: Army Melia Entered By: Army Melia on 07/15/2019 13:10:42 Timmins, [name] ([id number]) -------------------------------------------------------------------------------- Wound Assessment Details Patient Name: [name], [name]. Date of Service: 07/15/2019 12:45 PM Medical Record Number: [id number] Patient Account Number: [id number] Date of Birth/Sex: 02-Aug-1946 (73 y.o. F) Treating RN: Army Melia Primary Care Rheba Diamond: [name] Other Clinician: Referring Deylan Canterbury: [name] Treating Enrigue Hashimi/Extender: Linton Ham  G Weeks in Treatment: 31 Wound Status Wound Number: 9 Primary Venous Leg Ulcer Etiology: Wound Location: Left Lower Leg - Lateral, Posterior Wound Open Wounding Event: Gradually Appeared Status: Date Acquired: 07/08/2019 Comorbid Cataracts, Asthma, Sleep Apnea, Deep Vein Weeks Of Treatment: 1 History: Thrombosis, Hypertension, Peripheral Venous Clustered Wound: No Disease, Osteoarthritis, Received Chemotherapy, Received Radiation Photos Wound Measurements Length: (cm) 4 Width: (cm) 2 Depth: (cm) 0.1 Area: (cm) 6.283 Volume: (cm) 0.628 % Reduction in Area: -1012% % Reduction in Volume: -1001.8% Epithelialization: None Wound Description Full Thickness Without Exposed Support Classification: Structures Wound Margin: Indistinct, nonvisible Exudate Large Amount: Exudate Type: Serous Exudate Color: amber Foul Odor After Cleansing: No Slough/Fibrino No Wound Bed Granulation Amount: Large (67-100%) Exposed Structure Granulation Quality: Pink Fascia Exposed: No Necrotic Amount: None Present (0%) Fat Layer (Subcutaneous Tissue) Exposed: Yes Tendon Exposed: No Muscle Exposed:  No Joint Exposed: No Bone Exposed: No Ng, Atziry J. ([id number]) Treatment Notes Wound #9 (Left, Lateral, Posterior Lower Leg) Notes sorbact, drawtex, ABD, 3 layer Electronic Signature(s) Signed: 07/15/2019 1:37:17 PM By: Army Melia Entered By: Army Melia on 07/15/2019 13:11:05

## 2019-07-19 NOTE — Progress Notes (Signed)
JENNIFERANN, BOOMSMA ([id number]) Visit Report for 07/15/2019 Physician Orders Details Patient Name: [name], [name]. Date of Service: 07/15/2019 12:45 PM Medical Record Number: [id number] Patient Account Number: [id number] Date of Birth/Sex: [birth year] (73 y.o. F) Treating RN: Cornell Barman Primary Care Provider: [name] Other Clinician: Referring Provider: [name] Treating Provider/Extender: Tito Dine in Treatment: 31 Verbal / Phone Orders: Yes Clinician: Cornell Barman Read Back and Verified: No Diagnosis Coding Wound Cleansing Wound #5 Left,Medial Lower Leg o Clean wound with Normal Saline. o May Shower, gently pat wound dry prior to applying new dressing. Wound #6 Left,Distal,Lateral Lower Leg o Clean wound with Normal Saline. o May Shower, gently pat wound dry prior to applying new dressing. Anesthetic (add to Medication List) Wound #5 Left,Medial Lower Leg o Topical Lidocaine 4% cream applied to wound bed prior to debridement (In Clinic Only). o Benzocaine Topical Anesthetic Spray applied to wound bed prior to debridement (In Clinic Only). Wound #6 Left,Distal,Lateral Lower Leg o Topical Lidocaine 4% cream applied to wound bed prior to debridement (In Clinic Only). o Benzocaine Topical Anesthetic Spray applied to wound bed prior to debridement (In Clinic Only). Skin Barriers/Peri-Wound Care Wound #5 Left,Medial Lower Leg o Barrier cream - Zinc oxide o Other: - Antibiotic ointment on irritated areas Wound #6 Left,Distal,Lateral Lower Leg o Other: - Antibiotic ointment on irritated areas Primary Wound Dressing Wound #5 Left,Medial Lower Leg o Other: - Sorbact Wound #6 Left,Distal,Lateral Lower Leg o Other: - Sorbact Secondary Dressing Wound #5 Left,Medial Lower Leg o XtraSorb Wound #6 Left,Distal,Lateral Lower Leg o KAMILE, CICCARELLO. ([id number]) Dressing Change Frequency Wound #5 Left,Medial Lower Leg o  Change dressing every week Wound #6 Left,Distal,Lateral Lower Leg o Change dressing every week Follow-up Appointments Wound #5 Left,Medial Lower Leg o Return Appointment in 1 week. o Nurse Visit as needed - Return Monday, August 3rd for a re-wrap Wound #6 Left,Distal,Lateral Lower Leg o Return Appointment in 1 week. o Nurse Visit as needed Edema Control Wound #5 Left,Medial Lower Leg o 3 Layer Compression System - Left Lower Extremity Wound #6 Left,Distal,Lateral Lower Leg o 3 Layer Compression System - Left Lower Extremity Off-Loading Wound #5 Left,Medial Lower Leg o Other: - Elevate legs as needed Wound #6 Left,Distal,Lateral Lower Leg o Other: - Elevate legs as needed Electronic Signature(s) Signed: 07/16/2019 11:18:32 AM By: [name], [name], RN, CWS, [name], [name] Signed: 07/19/2019 7:07:38 AM By: Linton Ham MD Entered By: [name], [name], RN, CWS, Kim on 07/16/2019 11:18:31 JANETE, RECHNER ([id number]) -------------------------------------------------------------------------------- SuperBill Details Patient Name: PAYZLEE, CARBON. Date of Service: 07/15/2019 Medical Record Number: [id number] Patient Account Number: [id number] Date of Birth/Sex: 1946/09/10 (73 y.o. F) Treating RN: Army Melia Primary Care Provider: [name] Other Clinician: Referring Provider: [name] Treating Provider/Extender: Tito Dine in Treatment: 31 Diagnosis Coding ICD-10 Codes Code Description [id number] Non-pressure chronic ulcer of left calf limited to breakdown of skin I87.321 Chronic venous hypertension (idiopathic) with inflammation of right lower extremity I89.0 Lymphedema, not elsewhere classified Facility Procedures CPT4 Code: [id number] Description: (Facility Use Only) [place] LT LEG Modifier: Quantity: 1 Electronic Signature(s) Signed: 07/15/2019 1:37:17 PM By: Army Melia Signed: 07/19/2019 7:07:38 AM By: Linton Ham  MD Entered By: Army Melia on 07/15/2019 13:19:10

## 2019-07-20 ENCOUNTER — Other Ambulatory Visit: Payer: Self-pay

## 2019-07-20 ENCOUNTER — Encounter: Payer: Medicare Other | Admitting: Physician Assistant

## 2019-07-20 ENCOUNTER — Other Ambulatory Visit: Payer: Self-pay | Admitting: *Deleted

## 2019-07-20 ENCOUNTER — Other Ambulatory Visit
Admission: RE | Admit: 2019-07-20 | Discharge: 2019-07-20 | Disposition: A | Discharge status: Home / Self Care | Payer: Medicare Other | Source: Ambulatory Visit | Attending: Physician Assistant | Admitting: Physician Assistant

## 2019-07-20 DIAGNOSIS — B999 Unspecified infectious disease: Secondary | ICD-10-CM | POA: Insufficient documentation

## 2019-07-20 DIAGNOSIS — G4730 Sleep apnea, unspecified: Secondary | ICD-10-CM | POA: Diagnosis not present

## 2019-07-20 DIAGNOSIS — I890 Lymphedema, not elsewhere classified: Secondary | ICD-10-CM | POA: Diagnosis not present

## 2019-07-20 DIAGNOSIS — L97221 Non-pressure chronic ulcer of left calf limited to breakdown of skin: Secondary | ICD-10-CM | POA: Diagnosis not present

## 2019-07-20 DIAGNOSIS — I10 Essential (primary) hypertension: Secondary | ICD-10-CM | POA: Diagnosis not present

## 2019-07-20 DIAGNOSIS — M1990 Unspecified osteoarthritis, unspecified site: Secondary | ICD-10-CM | POA: Diagnosis not present

## 2019-07-20 DIAGNOSIS — J45909 Unspecified asthma, uncomplicated: Secondary | ICD-10-CM | POA: Diagnosis not present

## 2019-07-20 DIAGNOSIS — L539 Erythematous condition, unspecified: Secondary | ICD-10-CM | POA: Diagnosis not present

## 2019-07-20 DIAGNOSIS — L97229 Non-pressure chronic ulcer of left calf with unspecified severity: Secondary | ICD-10-CM | POA: Diagnosis not present

## 2019-07-20 DIAGNOSIS — L97829 Non-pressure chronic ulcer of other part of left lower leg with unspecified severity: Secondary | ICD-10-CM | POA: Diagnosis not present

## 2019-07-20 DIAGNOSIS — I872 Venous insufficiency (chronic) (peripheral): Secondary | ICD-10-CM | POA: Diagnosis not present

## 2019-07-20 NOTE — Progress Notes (Addendum)
[name], [name] Visit Report for 07/20/2019 Chief Complaint Document Details Patient Name: [name], [name]. Date of Service: 07/20/2019 1:30 PM Medical Record Number: [id number] Patient Account Number: [id number] Date of Birth/Sex: 1946-09-15 (73 y.o. F) Treating RN: Harold Barban Primary Care Provider: [name] Other Clinician: Referring Provider: [name] Treating Provider/Extender: STONE III, HOYT Weeks in Treatment: 31 Information Obtained from: Patient Chief Complaint Left calf venous stasis ulceration. 11/13/16; the patient is here for a left calf recurrent ulceration which is painful and has been present for the last month 01/22/17; the patient re-presents here for recurrent difficulties with blistering and leaking fluid on her left posterior calf 12/10/18; the patient returns to clinic for a wound on the left medial calf Electronic Signature(s) Signed: 07/20/2019 2:19:17 PM By: Worthy Keeler PA-C Entered By: Worthy Keeler on 07/20/2019 14:19:17 Losier, [name] -------------------------------------------------------------------------------- HPI Details Patient Name: [name], [name]. Date of Service: 07/20/2019 1:30 PM Medical Record Number: [id number] Patient Account Number: [id number] Date of Birth/Sex: [birth year] (73 y.o. F) Treating RN: Harold Barban Primary Care Provider: [name] Other Clinician: Referring Provider: [name] Treating Provider/Extender: STONE III, HOYT Weeks in Treatment: 28 History of Present Illness HPI Description: Pleasant 73 year old with history of chronic venous insufficiency. No diabetes or peripheral vascular disease. Left ABI 1.29. Questionable history of left lower extremity DVT. She developed a recurrent ulceration on her left lateral calf in December 2015, which she attributes to poor diet and subsequent lower extremity edema. She underwent endovenous laser ablation of her left greater  saphenous vein in 2010. She underwent laser ablation of accessory branch of left GSV in April 2016 by Dr. Kellie Simmering at [facility]. She was previously wearing Unna boots, which she tolerated well. Tolerating 2 layer compression and cadexomer iodine. She returns to clinic for follow-up and is without new complaints. She denies any significant pain at this time. She reports persistent pain with pressure. No claudication or ischemic rest pain. No fever or chills. No drainage. READMISSION 11/13/16; this is a 73 year old woman who is not a diabetic. She is here for a review of a painful area on her left medial lower extremity. I note that she was seen here previously last year for wound I believe to be in the same area. At that time she had undergone previously a left greater saphenous vein ablation by Dr. Kellie Simmering and she had a ablation of the anterior accessory branch of the left greater saphenous vein in March 2016. Seeing that the wound actually closed over. In reviewing the history with her today the ulcer in this area has been recurrent. She describes a biopsy of this area in 2009 that only showed stasis physiology. 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 Do not see that she has ever had arterial studies in the left leg. She tells me she has a follow-up with Dr. Kellie Simmering in roughly 10 days In any case she developed the reopening of this area roughly a month ago. On the background of this she describes rapidly increasing edema which has responded to Lasix 40 mg and metolazone 2.5 mg as well as the patient's lymph massage. She has been told she has both venous insufficiency and lymphedema but she cannot tolerate compression stockings 11/28/16; the patient  saw Dr. Kellie Simmering recently. Per the  patient he did arterial Dopplers in the office that did not show evidence of arterial insufficiency, per the patient he stated "treat this like an ordinary venous ulcer". She also saw her dermatologist Dr. Ronnald Ramp who felt that this was more of a vascular ulcer. In general things are improving although she arrives today with increasing bilateral lower extremity edema with weeping a deeper fluid through the wound on the left medial leg compatible with some degree of lymphedema 12/04/16; the patient's wound is fully epithelialized but I don't think fully healed. We will do another week of depression with Promogran and TCA however I suspect we'll be able to discharge her next week. This is a very unusual-looking wound which was initially a figure-of-eight type wound lying on its side surrounded by petechial like hemorrhage. She has had venous ablation on this side. She apparently does not have an arterial issue per Dr. Kellie Simmering. She saw her dermatologist thought it was "vascular". Patient is definitely going to need ongoing compression and I talked about this with her today she will go to elastic therapy after she leaves here next week 12/11/16; the patient's wound is not completely closed today. She has surrounding scar tissue and in further discussion with the patient it would appear that she had ulcers in this area in 2009 for a prolonged period of time ultimately requiring a punch biopsy of this area that only showed venous insufficiency. I did not previously pickup on this part of the history from the patient. 12/18/16; the patient's wound is completely epithelialized. There is no open area here. She has significant bilateral venous insufficiency with secondary lymphedema to a mild-to-moderate degree she does not have compression stockings.. She did not say anything to me when I was in the room, she told our intake nurse that she was still having pain in this area. This isn't unusual recurrent small open  area. She is going to go to elastic therapy to obtain compression stockings. 12/25/16; the patient's wound is fully epithelialized. There is no open area here. The patient describes some continued episodic discomfort in this area medial left calf. However everything looks fine and healed here. She is been to elastic therapy and caught herself 15-20 mmHg stockings, they apparently were having trouble getting 20-30 mm stockings in her size [name], [name] 01/22/17; this is a patient we discharged from the clinic a month ago. She has a recurrent open wound on her medial left calf. She had 15 mm support stockings. I told her I thought she needed 20-30 mm compression stockings. She tells me that she has been ill with hospitalization secondary to asthma and is been found to have severe hypokalemia likely secondary to a combination of Lasix and metolazone. This morning she noted blistering and leaking fluid on the posterior part of her left leg. She called our intake nurse urgently and we was saw her this afternoon. She has not had any real discomfort here. I don't know that she's been wearing any stockings on this leg for at least 2-3 days. ABIs in this clinic were 1.21 on the right and 1.3 on the left. She is previously seen vascular surgery who does not think that there is a peripheral arterial issue. 01/30/17; Patient arrives with no open wound on the left leg. She has been to elastic therapy and obtained 20-7mmhg below knee stockings and she has one on the right leg today. READMISSION 02/19/18; this Patschke is a now 73 year old  patient we've had in this clinic perhaps 3 times before. I had last looked at her from January 07 December 2016 with an area on the medial left leg. We discharged her on 12/25/16 however she had to be readmitted on 01/22/17 with a recurrence. I have in my notes that we discharged her on 20-30 mm stockings although she tells me she was only wearing support hose because  she cannot get stockings on predominantly related to her cervical spine surgery/issues. She has had previous ablations done by vein and vascular in [place] including a great saphenous vein ablation on the left with an anterior accessory branch ablation I think both of these were in 2016. On one of the previous visit she had a biopsy noted 2009 that was negative. She is not felt to have an arterial issue. She is not a diabetic. She does have a history of obstructive sleep apnea hypertension asthma as well as chronic venous insufficiency and lymphedema. On this occasion she noted 2 dry scaly patch on her left leg. She tried to put lotion on this it didn't really help. There were 2 open areas.the patient has been seeing her primary physician from 02/05/18 through 02/14/18. She had Unna boots applied. The superior wound now on the lateral left leg has closed but she's had one wound that remains open on the lateral left leg. This is not the same spot as we dealt with in 2018. ABIs in this clinic were 1.3 bilaterally 02/26/18; patient has a small wound on the left lateral calf. Dimensions are down. She has chronic venous insufficiency and lymphedema. 03/05/18; small open area on the left lateral calf. Dimensions are down. Tightly adherent necrotic debris over the surface of the wound which was difficult to remove. Also the dressing [over collagen] stuck to the wound surface. This was removed with some difficulty as well. Change the primary dressing to Hydrofera Blue ready 03/12/18; small open area on the left lateral calf. Comes in with tightly adherent surface eschar as well as some adherent Hydrofera Blue. 03/19/18; open area on the left lateral calf. Again adherent surface eschar as well as some adherent Hydrofera Blue nonviable subcutaneous tissue. She complained of pain all week even with the reduction from 4-3 layer compression I put on last week. Also she had an increase in her ankle and calf  measurements probably related to the same thing. 03/26/18; open area on the left lateral calf. A very small open area remains here. We used silver alginate starting last week as the Hydrofera Blue seem to stick to the wound bed. In using 4-layer compression 04/02/18; the open area in the left lateral calf at some adherent slough which I removed there is no open area here. We are able to transition her into her own compression stocking. Truthfully I think this is probably his support hose. However this does not maintain skin integrity will be limited. She cannot put over the toe compression stockings on because of neck problems hand problems etc. She is allergic to the lining layer of juxta lites. We might be forced to use extremitease stocking should this fail READMIT 11/24/2018 Patient is now a 73 year old woman who is not a diabetic. She has been in this clinic on at least 3 previous occasions largely with recurrent wounds on her left leg secondary to chronic venous insufficiency with secondary lymphedema. Her situation is complicated by inability to get stockings on and an allergy to neoprene which is apparently a component and at least juxta  lites and other stockings. As a result she really has not been wearing any stockings on her legs. She tells [place] that roughly 2 or 3 weeks ago she started noticing a stinging sensation just above her ankle on the left medial aspect. She has been diagnosed with pseudogout and she wondered whether this was what she was experiencing. She tried to dress this with something she bought at the store however subsequently it pulled skin off and now she has an open wound that is not improving. She has been using Vaseline gauze with a cover bandage. She saw her primary doctor last week who put an Haematologist on her. ABIs in this clinic was 1.03 on the left [name], [name]. ([id number]) 2/12; the area is on the left medial ankle. Odd-looking wound with what looks to be  surface epithelialization but a multitude of small petechial openings. This clearly not closed yet. We have been using silver alginate under 3 layer compression with TCA 2/19; the wound area did not look quite as good this week. Necrotic debris over the majority of the wound surface which required debridement. She continues to have a multitude of what looked to be small petechial openings. She reminds [place] that she had a biopsy on this initially during her first outbreak in 2015 in [place] dermatology. She expresses concern about this being a possible melanoma. 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 I assured her that this does not look anything like melanoma. Except for the petechial reaction it does look like a venous insufficiency area and she certainly has evidence of this on both sides 2/26; a difficult area on the left medial ankle. The patient clearly has chronic venous hypertension with some degree of lymphedema. The odd thing about the area is the small petechial hemorrhages. I am not really sure how to explain this. This was present last time and this is not a compression injury. We have been using Hydrofera Blue which I changed to last week 3/4; still using Hydrofera Blue. Aggressive debridement today. She does not have known arterial issues. She has seen Dr. Kellie Simmering at [facility] vein and vascular and and has an ablation on the left. [Anterior accessory branch of the greater saphenous]. From what I remember they did not feel she had an arterial issue. The patient has had this area biopsied in 2009 at [facility] dermatology and by her recollection they said this was "stasis". She is also follow-up with dermatology locally who thought that this was more of a vascular issue 3/11; using Hydrofera Blue. Aggressive debridement today. She does not have an arterial issue. We are using 3 layer compression  although we may need to go to 4. The patient has been in for multiple changes to her wrap since I last saw her a week ago. She says that the area was leaking. I do not have too much more information on what was found 01/19/19 on evaluation today patient was actually being seen for a nurse visit when unfortunately she had the area on her left lateral lower extremity as well as weeping from the right lower extremity that became apparent. Therefore we did end up actually seeing her for a full visit with myself. She is having some pain at this site as well but fortunately nothing too significant at this point. No fevers, chills, nausea, or vomiting noted at this time. 3/18-Patient is back to the clinic with the  left leg venous leg ulcer, the ulcer is larger in size, has a surface that is densely adherent with fibrinous tissue, the Hydrofera Blue was used but is densely adherent and there was difficulty in removing it. The right lower extremity was also wrapped for weeping edema. Patient has a new area over the left lateral foot above the malleolus that is small and appears to have no debris with intact surrounding skin. Patient is on increased dose of Lasix also as a means to edema management 3/25; the patient has a nonhealing venous ulcer on the medial left leg and last week developed a smaller area on the lateral left calf. We have been using Hydrofera Blue with a contact layer. 4/1; no major change in these wounds areas. Left medial and more recently left lateral calf. I tried Iodoflex last week to aid in debridement she did not tolerate this. She stated her pain was terrible all week. She took the top layer of the 4 layer compression off. 4/8; the patient actually looks somewhat better in terms of her more prominent left lateral calf wound. There is some healthy looking tissue here. She is still complaining of a lot of discomfort. 4/15; patient in a lot of pain secondary to sciatica. She is on a  prednisone taper prescribed by her primary physician. She has the 2 areas one on the left medial and more recently a smaller area on the left lateral calf. Both of these just above the malleoli 4/22; her back pain is better but she still states she is very uncomfortable and now feels she is intolerant to the [employer]. No real change in the wounds we have been using Sorbact. She has been previously intolerant to Iodoflex. There is not a lot of option about what we can use to debride this wound under compression that she no doubt needs. sHe states Ultram no longer works for her pain 4/29; no major change in the wounds slightly increased depth. Surface on the original medial wound perhaps somewhat improved however the more recent area on the lateral left ankle is 100% covered in very adherent debris we have been using Sorbact. She tolerates 4 layer compression well and her edema control is a lot better. She has not had to come in for a nurse check 5/6; no major change in the condition of the wounds. She did consent to debridement today which was done with some difficulty. Continuing Sorbact. She did not tolerate Iodoflex. She was in for a check of her compression the day after we wrapped her last week this was adjusted but nothing much was found 5/13; no major change in the condition or area of the wounds. I was able to get a fairly aggressive debridement done on the lateral left leg wound. Even using Sorbact under compression. She came back in on Friday to have the wrap changed. She says she felt uncomfortable on the lateral aspect of her ankle. She has a long history of chronic venous insufficiency including previous ablation surgery on this side. 5/20-Patient returns for wounds on left leg with both wounds covered in slough, with the lateral leg wound larger in size, she has been in 3 layer compression and felt more comfortable, she describes pain in ankle, in leg and pins and needles in  foot, and is about to try Pamelor for this 6/3; wounds on the left lateral and left medial leg. The area medially which is the most recent of the 2 seems to have had the  largest increase in dimensions. We have been using Sorbac to try and debride the surface. She has been to see orthopedics they apparently did a plain x-ray that was indeterminant. Diagnosed her with neuropathy and they have ordered an MRI to [name], [name]. ([id number]) determine if there is underlying osteomyelitis. This was not high on my thought list but I suppose it is prudent. We have advised her to make an appointment with vein and vascular in [place]. She has a history of a left greater saphenous and accessory vein ablations I wonder if there is anything else that can be done from a surgical point of view to help in these difficult refractory wounds. We have previously healed this wound on one occasion but it keeps on reopening [medial side] 6/10; deep tissue culture I did last week I think on the left medial wound showed both moderate E. coli and moderate staph aureus [MSSA]. She is going to require antibiotics and I have chosen Augmentin. We have been using Sorbact and we have made better looking wound surface on both sides but certainly no improvement in wound area. She was back in last Friday apparently for a dressing changes the wrap was hurting her outer left ankle. She has not managed to get a hold of vein and vascular in [place]. We are going to have to make her that appointment 6/17; patient is tolerating the Augmentin. She had an MRI that I think was ordered by orthopedic surgeon this did not show osteomyelitis or an abscess did suggest cellulitis. We have been using Sorbact to the lateral and medial ankles. We have been trying to arrange a follow-up appointment with vein and vascular in [place] or did her original ablations. We apparently an area sent the request to vein and vascular in [facility] 6/24;  patient has completed the Augmentin. We do not yet have a vein and vascular appointment in [place]. I am not sure what the issue is here we have asked her to call tomorrow. We are using Sorbact. Making some improvements and especially the medial wound. Both surfaces however look better medial and lateral. 7/1; the patient has been in contact with vein and vascular in [place] but has not yet received an appointment. Using Sorbact we have gradually improve the wound surface with no improvement in surface area. She is approved for Apligraf but the wound surface still is not completely viable. She has not had to come in for a dressing change 7/8; the patient has an appointment with vein and vascular on 7/31 which is a Friday afternoon. She is concerned about getting back here for [place] to dress her wounds. I think it is important to have them goal for her venous reflux/history of ablations etc. to see if anything else can be done. She apparently tested positive for 1 of the blood tests with regards to lupus and saw a rheumatologist. He has raised the issue of vasculitis again. I have had this thought in the past however the evidence seems overwhelming that this is a venous reflux etiology. If the rheumatologist tells me there is clinical and laboratory investigation is positive for lupus I will rethink this. 7/15; the patient's wound surfaces are quite a bit better. The medial area which was her original wound now has no depth although the lateral wound which was the more recent area actually appears larger. Both with viable surfaces which is indeed better. Using Sorbact. I wanted to use Apligraf on her however there is the issue of the  vein and vascular appointment on 7/31 at 2:00 in the afternoon which would not allow her to get back to be rewrapped and they would no doubt remove the graft 7/22; the patient's wound surfaces have moderate amount of debris although generally look better. The lateral  one is larger with 2 small satellite areas superiorly. We are waiting for her vein and vascular appointment on 7/31. She has been approved for Apligraf which I would like to use after th 7/29; wound surfaces have improved no debridement is required we have been using Sorbact. She sees vein and vascular on Friday with this so question of whether anything can be done to lessen the likelihood of recurrence and/or speed the healing of these areas. She is already had previous ablations. She no doubt has severe venous hypertension 8/5-Patient returns at 1 week, she was in [place] for 3 days by her podiatrist, we have been using so backed to the wound, she has increased pain in both the wounds on the left lower leg especially the more distal one on the lateral aspect 8/12-Patient returns at 1 week and she is agreeable to having debridement in both wounds on her left leg today. We have been using Sorbact, and vascular studies were reviewed at last visit 8/19; the patient arrives with her wounds fairly clean and no debridement is required. We have used Sorbact which is really done a nice job in cleaning up these very difficult wound surfaces. The patient saw Dr. Donzetta Matters of vascular surgery on 7/31. He did not feel that there was an arterial component. He felt that her treated greater saphenous vein is adequately addressed and that the small saphenous vein did not appear to be involved significantly. She was also noted to have deep venous reflux which is not treatable. Dr. Donzetta Matters mentioned the possibility of a central obstructive component leading to reflux and he offered her central venography. She wanted to discuss this or think about it. I have urged her to go ahead with this. She has had recurrent difficult wounds in these areas which do heal but after months in the clinic. If there is anything that can be done to reduce the likelihood of this I think it is worth it. 9/2 she is still working towards getting  follow-up with Dr. Donzetta Matters to schedule her central venography. I put Apligraf on 2 weeks ago on both wounds on the medial and lateral part of her left lower leg. She arrives in clinic today with 3 superficial additional wounds above the area laterally and one below the wound medially. She describes a lot of discomfort. I think these are probably wrapped injuries. Does not look like she has cellulitis. 07/20/2019 on evaluation today patient appears to be doing somewhat poorly in regard to her lower extremity ulcers. She in fact showed signs of erythema in fact we may even be dealing with an infection at this time. Unfortunately I am unsure if this is just infection or if indeed there may be some allergic reaction that occurred as a result of the Apligraf application. With that being said that would be unusual but nonetheless not impossible in this patient is one who is unfortunately allergic to quite a bit. Currently we have been using the Sorbact which seems to do as well as anything for her. I do think we may want to obtain a culture today to see if there is anything showing up there that may need to be addressed. [name], [name] Electronic Signature(s)  Signed: 07/20/2019 5:22:54 PM By: Worthy Keeler PA-C Entered By: Worthy Keeler on 07/20/2019 17:22:53 [name] ([id number]) -------------------------------------------------------------------------------- Physical Exam Details Patient Name: BRENNYN, GUARINO. Date of Service: 07/20/2019 1:30 PM Medical Record Number: [id number] Patient Account Number: [id number] Date of Birth/Sex: December 27, 1945 (73 y.o. F) Treating RN: Harold Barban Primary Care Provider: [name] Other Clinician: Referring Provider: [name] Treating Provider/Extender: STONE III, HOYT Weeks in Treatment: 23 Constitutional [name] and well-hydrated in no acute distress. Respiratory normal breathing without difficulty. clear to  auscultation bilaterally. Cardiovascular regular rate and rhythm with normal S1, S2. Psychiatric this patient is able to make decisions and demonstrates good insight into disease process. Alert and Oriented x 3. pleasant and cooperative. Notes Patient unfortunately continues to have exquisite tenderness in regard to her wound at this time. That is very unfortunate and in fact does have me concerned about the possibility of infection although it could also be allergic reaction that she is experiencing. Nonetheless she has done okay with the Sorbact in the past. I did obtain a wound culture today as well and we of course will see what that shows and make any adjustments as necessary in the meantime I am going to go ahead and prescribe Augmentin for her. Electronic Signature(s) Signed: 07/20/2019 5:23:31 PM By: Worthy Keeler PA-C Entered By: Worthy Keeler on 07/20/2019 17:23:30 Cropper, [name] ([id number]) -------------------------------------------------------------------------------- Physician Orders Details Patient Name: PHILADELPHIA, RANSFORD. Date of Service: 07/20/2019 1:30 PM Medical Record Number: [id number] Patient Account Number: [id number] Date of Birth/Sex: [birth year] (73 y.o. F) Treating RN: Harold Barban Primary Care Provider: [name] Other Clinician: Referring Provider: [name] Treating Provider/Extender: Melburn Hake, HOYT Weeks in Treatment: 15 Verbal / Phone Orders: No Diagnosis Coding ICD-10 Coding Code Description L97.221 Non-pressure chronic ulcer of left calf limited to breakdown of skin I87.321 Chronic venous hypertension (idiopathic) with inflammation of right lower extremity I89.0 Lymphedema, not elsewhere classified Wound Cleansing Wound #5 Left,Medial Lower Leg o Clean wound with Normal Saline. o May Shower, gently pat wound dry prior to applying new dressing. Wound #6 Left,Distal,Lateral Lower Leg o Clean wound with Normal Saline. o  May Shower, gently pat wound dry prior to applying new dressing. Wound #7 Left,Proximal,Lateral Lower Leg o Clean wound with Normal Saline. o May Shower, gently pat wound dry prior to applying new dressing. Wound #8 Left,Lateral Lower Leg o Clean wound with Normal Saline. o May Shower, gently pat wound dry prior to applying new dressing. Wound #9 Left,Lateral,Posterior Lower Leg o Clean wound with Normal Saline. o May Shower, gently pat wound dry prior to applying new dressing. Anesthetic (add to Medication List) Wound #5 Left,Medial Lower Leg o Topical Lidocaine 4% cream applied to wound bed prior to debridement (In Clinic Only). o Benzocaine Topical Anesthetic Spray applied to wound bed prior to debridement (In Clinic Only). Primary Wound Dressing Wound #5 Left,Medial Lower Leg o Other: - Sorbact Wound #6 Left,Distal,Lateral Lower Leg o Other: - Sorbact Wound #7 Left,Proximal,Lateral Lower Leg o Other: - Sorbact Wound #8 Left,Lateral Lower Leg o Other: - Sorbact [name], Cathline J. ([id number]) Wound #9 Left,Lateral,Posterior Lower Leg o Other: - Sorbact Secondary Dressing Wound #5 Left,Medial Lower Leg o Conform/Kerlix Wound #6 Left,Distal,Lateral Lower Leg o Conform/Kerlix Wound #7 Left,Proximal,Lateral Lower Leg o Conform/Kerlix Wound #8 Left,Lateral Lower Leg o Conform/Kerlix Wound #9 Left,Lateral,Posterior Lower Leg o Conform/Kerlix Follow-up Appointments Wound #5 Left,Medial Lower Leg o Return Appointment in 1 week. - Wednesday September 16th o Nurse Visit  as needed Wound #6 Left,Distal,Lateral Lower Leg o Return Appointment in 1 week. - Wednesday September 16th o Nurse Visit as needed Wound #7 Left,Proximal,Lateral Lower Leg o Return Appointment in 1 week. - Wednesday September 16th o Nurse Visit as needed Wound #8 Left,Lateral Lower Leg o Return Appointment in 1 week. - Wednesday September 16th o Nurse Visit as  needed Wound #9 Left,Lateral,Posterior Lower Leg o Return Appointment in 1 week. - Wednesday September 16th o Nurse Visit as needed Edema Control Wound #5 Left,Medial Lower Leg o Other: - Tubi Grip "F" Off-Loading Wound #5 Left,Medial Lower Leg o Other: - Elevate legs as needed Wound #6 Left,Distal,Lateral Lower Leg o Other: - Elevate legs as needed Wound #7 Left,Proximal,Lateral Lower Leg o Other: - Elevate legs as needed Wound #8 Left,Lateral Lower Leg ALAYSA, DIGILIO ([id number]) o Other: - Elevate legs as needed Wound #9 Left,Lateral,Posterior Lower Leg o Other: - Elevate legs as needed Laboratory o Bacteria identified in Wound by Culture (MICRO) - Left lateral Lower Leg, Infection oooo LOINC Code: [id number] oooo Convenience Name: Wound culture routine Patient Medications Allergies: Sulfa (Sulfonamide Antibiotics), latex, Neoprene Notifications Medication Indication Start End Augmentin 07/20/2019 DOSE 1 - oral 875 mg-125 mg tablet - 1 tablet oral taken 2 times a day for 10 days Electronic Signature(s) Signed: 07/20/2019 2:54:11 PM By: Worthy Keeler PA-C Entered By: Worthy Keeler on 07/20/2019 14:54:09 Villamor, [name] ([id number]) -------------------------------------------------------------------------------- Problem List Details Patient Name: TERRELL, WOBIG. Date of Service: 07/20/2019 1:30 PM Medical Record Number: [id number] Patient Account Number: [id number] Date of Birth/Sex: 1945-12-08 (73 y.o. F) Treating RN: Harold Barban Primary Care Provider: [name] Other Clinician: Referring Provider: [name] Treating Provider/Extender: Melburn Hake, HOYT Weeks in Treatment: 31 Active Problems ICD-10 Evaluated Encounter Code Description Active Date Today Diagnosis L97.221 Non-pressure chronic ulcer of left calf limited to breakdown of 01/07/2019 No Yes skin I87.321 Chronic venous hypertension (idiopathic) with inflammation of 12/10/2018 No  Yes right lower extremity I89.0 Lymphedema, not elsewhere classified 12/10/2018 No Yes Inactive Problems Resolved Problems Electronic Signature(s) Signed: 07/20/2019 2:19:04 PM By: Worthy Keeler PA-C Entered By: Worthy Keeler on 07/20/2019 14:19:03 Beumer, [name] ([id number]) -------------------------------------------------------------------------------- Progress Note/History and Physical Details Patient Name: ZAELYNN, BEVILLE. Date of Service: 07/20/2019 1:30 PM Medical Record Number: [id number] Patient Account Number: [id number] Date of Birth/Sex: 1946-02-18 (73 y.o. F) Treating RN: Harold Barban Primary Care Provider: [name] Other Clinician: Referring Provider: [name] Treating Provider/Extender: Melburn Hake, HOYT Weeks in Treatment: 31 Subjective Chief Complaint Information obtained from Patient Left calf venous stasis ulceration. 11/13/16; the patient is here for a left calf recurrent ulceration which is painful and has been present for the last month 01/22/17; the patient re-presents here for recurrent difficulties with blistering and leaking fluid on her left posterior calf 12/10/18; the patient returns to clinic for a wound on the left medial calf History of Present Illness (HPI) Pleasant 73 year old with history of chronic venous insufficiency. No diabetes or peripheral vascular disease. Left ABI 1.29. Questionable history of left lower extremity DVT. She developed a recurrent ulceration on her left lateral calf in December 2015, which she attributes to poor diet and subsequent lower extremity edema. She underwent endovenous laser ablation of her left greater saphenous vein in 2010. She underwent laser ablation of accessory branch of left GSV in April 2016 by Dr. Kellie Simmering at [name]. She was previously wearing Unna boots, which she tolerated well. Tolerating 2 layer compression and cadexomer iodine. She returns  to clinic for follow-up and is without new  complaints. She denies any significant pain at this time. She reports persistent pain with pressure. No claudication or ischemic rest pain. No fever or chills. No drainage. READMISSION 11/13/16; this is a 73 year old woman who is not a diabetic. She is here for a review of a painful area on her left medial lower extremity. I note that she was seen here previously last year for wound I believe to be in the same area. At that time she had undergone previously a left greater saphenous vein ablation by Dr. Kellie Simmering and she had a ablation of the anterior accessory branch of the left greater saphenous vein in March 2016. Seeing that the wound actually closed over. In reviewing the history with her today the ulcer in this area has been recurrent. She describes a biopsy of this area in 2009 that only showed stasis physiology. 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 Do not see that she has ever had arterial studies in the left leg. She tells me she has a follow-up with Dr. Kellie Simmering in roughly 10 days In any case she developed the reopening of this area roughly a month ago. On the background of this she describes rapidly increasing edema which has responded to Lasix 40 mg and metolazone 2.5 mg as well as the patient's lymph massage. She has been told she has both venous insufficiency and lymphedema but she cannot tolerate compression stockings 11/28/16; the patient saw Dr. Kellie Simmering recently. Per the patient he did arterial Dopplers in the office that did not show evidence of arterial insufficiency, per the patient he stated "treat this like an ordinary venous ulcer". She also saw her dermatologist Dr. Ronnald Ramp who felt that this was more of a vascular ulcer. In general things are improving although  she arrives today with increasing bilateral lower extremity edema with weeping a deeper fluid through the wound on the left medial leg compatible with some degree of lymphedema 12/04/16; the patient's wound is fully epithelialized but I don't think fully healed. We will do another week of depression with Promogran and TCA however I suspect we'll be able to discharge her next week. This is a very unusual-looking wound which was initially a figure-of-eight type wound lying on its side surrounded by petechial like hemorrhage. She has had venous ablation on this side. She apparently does not have an arterial issue per Dr. Kellie Simmering. She saw her dermatologist thought it was "vascular". Patient is definitely going to need ongoing compression and I talked about this with her today she will go to elastic therapy after she leaves here next week 12/11/16; the patient's wound is not completely closed today. She has surrounding scar tissue and in further discussion with the patient it would appear that she had ulcers in this area in 2009 for a prolonged period of time ultimately requiring a punch biopsy of this area that only showed venous insufficiency. I did not previously pickup on this part of the history from the [place]. ([id number]) patient. 12/18/16; the patient's wound is completely epithelialized. There is no open area here. She has significant bilateral venous insufficiency with secondary lymphedema to a mild-to-moderate degree she does not have compression stockings.. She did not say anything to me when  I was in the room, she told our intake nurse that she was still having pain in this area. This isn't unusual recurrent small open area. She is going to go to elastic therapy to obtain compression stockings. 12/25/16; the patient's wound is fully epithelialized. There is no open area here. The patient describes some continued episodic discomfort in this area medial left calf. However everything looks  fine and healed here. She is been to elastic therapy and caught herself 15-20 mmHg stockings, they apparently were having trouble getting 20-30 mm stockings in her size 01/22/17; this is a patient we discharged from the clinic a month ago. She has a recurrent open wound on her medial left calf. She had 15 mm support stockings. I told her I thought she needed 20-30 mm compression stockings. She tells me that she has been ill with hospitalization secondary to asthma and is been found to have severe hypokalemia likely secondary to a combination of Lasix and metolazone. This morning she noted blistering and leaking fluid on the posterior part of her left leg. She called our intake nurse urgently and we was saw her this afternoon. She has not had any real discomfort here. I don't know that she's been wearing any stockings on this leg for at least 2-3 days. ABIs in this clinic were 1.21 on the right and 1.3 on the left. She is previously seen vascular surgery who does not think that there is a peripheral arterial issue. 01/30/17; Patient arrives with no open wound on the left leg. She has been to elastic therapy and obtained 20-40mmhg below knee stockings and she has one on the right leg today. READMISSION 02/19/18; this Keesee is a now 73 year old patient we've had in this clinic perhaps 3 times before. I had last looked at her from January 07 December 2016 with an area on the medial left leg. We discharged her on 12/25/16 however she had to be readmitted on 01/22/17 with a recurrence. I have in my notes that we discharged her on 20-30 mm stockings although she tells me she was only wearing support hose because she cannot get stockings on predominantly related to her cervical spine surgery/issues. She has had previous ablations done by vein and vascular in [place] including a great saphenous vein ablation on the left with an anterior accessory branch ablation I think both of these were in 2016. On one of  the previous visit she had a biopsy noted 2009 that was negative. She is not felt to have an arterial issue. She is not a diabetic. She does have a history of obstructive sleep apnea hypertension asthma as well as chronic venous insufficiency and lymphedema. On this occasion she noted 2 dry scaly patch on her left leg. She tried to put lotion on this it didn't really help. There were 2 open areas.the patient has been seeing her primary physician from 02/05/18 through 02/14/18. She had Unna boots applied. The superior wound now on the lateral left leg has closed but she's had one wound that remains open on the lateral left leg. This is not the same spot as we dealt with in 2018. ABIs in this clinic were 1.3 bilaterally 02/26/18; patient has a small wound on the left lateral calf. Dimensions are down. She has chronic venous insufficiency and lymphedema. 03/05/18; small open area on the left lateral calf. Dimensions are down. Tightly adherent necrotic debris over the surface of the wound which was difficult to remove. Also the dressing [over collagen] stuck to  the wound surface. This was removed with some difficulty as well. Change the primary dressing to Hydrofera Blue ready 03/12/18; small open area on the left lateral calf. Comes in with tightly adherent surface eschar as well as some adherent Hydrofera Blue. 03/19/18; open area on the left lateral calf. Again adherent surface eschar as well as some adherent Hydrofera Blue nonviable subcutaneous tissue. She complained of pain all week even with the reduction from 4-3 layer compression I put on last week. Also she had an increase in her ankle and calf measurements probably related to the same thing. 03/26/18; open area on the left lateral calf. A very small open area remains here. We used silver alginate starting last week as the Hydrofera Blue seem to stick to the wound bed. In using 4-layer compression 04/02/18; the open area in the left lateral calf at  some adherent slough which I removed there is no open area here. We are able to transition her into her own compression stocking. Truthfully I think this is probably his support hose. However this does not maintain skin integrity will be limited. She cannot put over the toe compression stockings on because of neck problems hand problems etc. She is allergic to the lining layer of juxta lites. We might be forced to use extremitease stocking should this fail READMIT 11/24/2018 Patient is now a 73 year old woman who is not a diabetic. She has been in this clinic on at least 3 previous occasions largely with recurrent wounds on her left leg secondary to chronic venous insufficiency with secondary lymphedema. Her situation is complicated by inability to get stockings on and an allergy to neoprene which is apparently a component and at least juxta lites and other stockings. As a result she really has not been wearing any stockings on her legs. VERIDIANA, AZURE ([id number]) She tells [place] that roughly 2 or 3 weeks ago she started noticing a stinging sensation just above her ankle on the left medial aspect. She has been diagnosed with pseudogout and she wondered whether this was what she was experiencing. She tried to dress this with something she bought at the store however subsequently it pulled skin off and now she has an open wound that is not improving. She has been using Vaseline gauze with a cover bandage. She saw her primary doctor last week who put an Haematologist on her. ABIs in this clinic was 1.03 on the left 2/12; the area is on the left medial ankle. Odd-looking wound with what looks to be surface epithelialization but a multitude of small petechial openings. This clearly not closed yet. We have been using silver alginate under 3 layer compression with TCA 2/19; the wound area did not look quite as good this week. Necrotic debris over the majority of the wound surface which required debridement.  She continues to have a multitude of what looked to be small petechial openings. She reminds [place] that she had a biopsy on this initially during her first outbreak in 2015 in [place] [facility]. She expresses concern about this being a possible melanoma. 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 I assured her that this does not look anything like melanoma. Except for the petechial reaction it does look like a venous insufficiency area and she certainly has evidence of this on both sides 2/26; a difficult area on the left medial ankle. The patient clearly has chronic venous hypertension with some  degree of lymphedema. The odd thing about the area is the small petechial hemorrhages. I am not really sure how to explain this. This was present last time and this is not a compression injury. We have been using Hydrofera Blue which I changed to last week 3/4; still using Hydrofera Blue. Aggressive debridement today. She does not have known arterial issues. She has seen Dr. Kellie Simmering at [facility] vein [facility] vascular and and has an ablation on the left. [Anterior accessory branch of the greater saphenous]. From what I remember they did not feel she had an arterial issue. The patient has had this area biopsied in 2009 at [facility] dermatology and by her recollection they said this was "stasis". She is also follow-up with dermatology locally who thought that this was more of a vascular issue 3/11; using Hydrofera Blue. Aggressive debridement today. She does not have an arterial issue. We are using 3 layer compression although we may need to go to 4. The patient has been in for multiple changes to her wrap since I last saw her a week ago. She says that the area was leaking. I do not have too much more information on what was found 01/19/19 on evaluation today patient was actually being seen for a nurse visit when unfortunately  she had the area on her left lateral lower extremity as well as weeping from the right lower extremity that became apparent. Therefore we did end up actually seeing her for a full visit with myself. She is having some pain at this site as well but fortunately nothing too significant at this point. No fevers, chills, nausea, or vomiting noted at this time. 3/18-Patient is back to the clinic with the left leg venous leg ulcer, the ulcer is larger in size, has a surface that is densely adherent with fibrinous tissue, the Hydrofera Blue was used but is densely adherent and there was difficulty in removing it. The right lower extremity was also wrapped for weeping edema. Patient has a new area over the left lateral foot above the malleolus that is small and appears to have no debris with intact surrounding skin. Patient is on increased dose of Lasix also as a means to edema management 3/25; the patient has a nonhealing venous ulcer on the medial left leg and last week developed a smaller area on the lateral left calf. We have been using Hydrofera Blue with a contact layer. 4/1; no major change in these wounds areas. Left medial and more recently left lateral calf. I tried Iodoflex last week to aid in debridement she did not tolerate this. She stated her pain was terrible all week. She took the top layer of the 4 layer compression off. 4/8; the patient actually looks somewhat better in terms of her more prominent left lateral calf wound. There is some healthy looking tissue here. She is still complaining of a lot of discomfort. 4/15; patient in a lot of pain secondary to sciatica. She is on a prednisone taper prescribed by her primary physician. She has the 2 areas one on the left medial and more recently a smaller area on the left lateral calf. Both of these just above the malleoli 4/22; her back pain is better but she still states she is very uncomfortable and now feels she is intolerant to the [employer]. No real change in the wounds we have been using Sorbact. She has been previously intolerant to Iodoflex. There is not a lot of option about what we can use  to debride this wound under compression that she no doubt needs. sHe states Ultram no longer works for her pain 4/29; no major change in the wounds slightly increased depth. Surface on the original medial wound perhaps somewhat improved however the more recent area on the lateral left ankle is 100% covered in very adherent debris we have been using Sorbact. She tolerates 4 layer compression well and her edema control is a lot better. She has not had to come in for a nurse check 5/6; no major change in the condition of the wounds. She did consent to debridement today which was done with some difficulty. Continuing Sorbact. She did not tolerate Iodoflex. She was in for a check of her compression the day after we wrapped her last week this was adjusted but nothing much was found 5/13; no major change in the condition or area of the wounds. I was able to get a fairly aggressive debridement done on the lateral left leg wound. Even using Sorbact under compression. She came back in on Friday to have the wrap changed. She [name], [name] says she felt uncomfortable on the lateral aspect of her ankle. She has a long history of chronic venous insufficiency including previous ablation surgery on this side. 5/20-Patient returns for wounds on left leg with both wounds covered in slough, with the lateral leg wound larger in size, she has been in 3 layer compression and felt more comfortable, she describes pain in ankle, in leg and pins and needles in foot, and is about to try Pamelor for this 6/3; wounds on the left lateral and left medial leg. The area medially which is the most recent of the 2 seems to have had the largest increase in dimensions. We have been using Sorbac to try and debride the surface. She has been to see  orthopedics they apparently did a plain x-ray that was indeterminant. Diagnosed her with neuropathy and they have ordered an MRI to determine if there is underlying osteomyelitis. This was not high on my thought list but I suppose it is prudent. We have advised her to make an appointment with vein and vascular in [place]. She has a history of a left greater saphenous and accessory vein ablations I wonder if there is anything else that can be done from a surgical point of view to help in these difficult refractory wounds. We have previously healed this wound on one occasion but it keeps on reopening [medial side] 6/10; deep tissue culture I did last week I think on the left medial wound showed both moderate E. coli and moderate staph aureus [MSSA]. She is going to require antibiotics and I have chosen Augmentin. We have been using Sorbact and we have made better looking wound surface on both sides but certainly no improvement in wound area. She was back in last Friday apparently for a dressing changes the wrap was hurting her outer left ankle. She has not managed to get a hold of vein and vascular in [place]. We are going to have to make her that appointment 6/17; patient is tolerating the Augmentin. She had an MRI that I think was ordered by orthopedic surgeon this did not show osteomyelitis or an abscess did suggest cellulitis. We have been using Sorbact to the lateral and medial ankles. We have been trying to arrange a follow-up appointment with vein and vascular in [place] or did her original ablations. We apparently an area sent the request to vein and vascular in [facility] 6/24;  patient has completed the Augmentin. We do not yet have a vein and vascular appointment in [place]. I am not sure what the issue is here we have asked her to call tomorrow. We are using Sorbact. Making some improvements and especially the medial wound. Both surfaces however look better medial and  lateral. 7/1; the patient has been in contact with vein and vascular in [place] but has not yet received an appointment. Using Sorbact we have gradually improve the wound surface with no improvement in surface area. She is approved for Apligraf but the wound surface still is not completely viable. She has not had to come in for a dressing change 7/8; the patient has an appointment with vein and vascular on 7/31 which is a Friday afternoon. She is concerned about getting back here for [place] to dress her wounds. I think it is important to have them goal for her venous reflux/history of ablations etc. to see if anything else can be done. She apparently tested positive for 1 of the blood tests with regards to lupus and saw a rheumatologist. He has raised the issue of vasculitis again. I have had this thought in the past however the evidence seems overwhelming that this is a venous reflux etiology. If the rheumatologist tells me there is clinical and laboratory investigation is positive for lupus I will rethink this. 7/15; the patient's wound surfaces are quite a bit better. The medial area which was her original wound now has no depth although the lateral wound which was the more recent area actually appears larger. Both with viable surfaces which is indeed better. Using Sorbact. I wanted to use Apligraf on her however there is the issue of the vein and vascular appointment on 7/31 at 2:00 in the afternoon which would not allow her to get back to be rewrapped and they would no doubt remove the graft 7/22; the patient's wound surfaces have moderate amount of debris although generally look better. The lateral one is larger with 2 small satellite areas superiorly. We are waiting for her vein and vascular appointment on 7/31. She has been approved for Apligraf which I would like to use after th 7/29; wound surfaces have improved no debridement is required we have been using Sorbact. She sees vein and  vascular on Friday with this so question of whether anything can be done to lessen the likelihood of recurrence and/or speed the healing of these areas. She is already had previous ablations. She no doubt has severe venous hypertension 8/5-Patient returns at 1 week, she was in [place] for 3 days by her podiatrist, we have been using so backed to the wound, she has increased pain in both the wounds on the left lower leg especially the more distal one on the lateral aspect 8/12-Patient returns at 1 week and she is agreeable to having debridement in both wounds on her left leg today. We have been using Sorbact, and vascular studies were reviewed at last visit 8/19; the patient arrives with her wounds fairly clean and no debridement is required. We have used Sorbact which is really done a nice job in cleaning up these very difficult wound surfaces. The patient saw Dr. Donzetta Matters of vascular surgery on 7/31. He did not feel that there was an arterial component. He felt that her treated greater saphenous vein is adequately addressed and that the small saphenous vein did not appear to be involved significantly. She was also noted to have deep venous reflux which is not treatable.  Dr. Donzetta Matters mentioned the possibility of a central obstructive component leading to reflux and he offered her central venography. She wanted to discuss this or think about it. I have urged her to go ahead with this. She has had recurrent difficult wounds in these areas which do heal but after months in the clinic. If there is anything that can be done to reduce the likelihood of this I think it is worth it. 9/2 she is still working towards getting follow-up with Dr. Donzetta Matters to schedule her central venography. I put Apligraf on 2 weeks ago on both wounds on the medial and lateral part of her left lower leg. She arrives in clinic today with 3 superficial additional wounds above the area laterally and one below the wound medially. She  describes a lot of discomfort. I think these are ROSEBELLE, [name] probably wrapped injuries. Does not look like she has cellulitis. 07/20/2019 on evaluation today patient appears to be doing somewhat poorly in regard to her lower extremity ulcers. She in fact showed signs of erythema in fact we may even be dealing with an infection at this time. Unfortunately I am unsure if this is just infection or if indeed there may be some allergic reaction that occurred as a result of the Apligraf application. With that being said that would be unusual but nonetheless not impossible in this patient is one who is unfortunately allergic to quite a bit. Currently we have been using the Sorbact which seems to do as well as anything for her. I do think we may want to obtain a culture today to see if there is anything showing up there that may need to be addressed. Patient History Information obtained from Patient. Family History Cancer - Mother,Paternal Grandparents, Hypertension - Mother,Father, Kidney Disease - Paternal Grandparents, Lung Disease - Paternal Grandparents, No family history of Diabetes, Heart Disease, Hereditary Spherocytosis, Seizures, Stroke, Thyroid Problems, Tuberculosis. Social History Former smoker - quit at age 65, Marital Status - Divorced, Alcohol Use - Never, Drug Use - No History, Caffeine Use - Daily. Medical History Eyes Patient has history of Cataracts Denies history of Glaucoma, Optic Neuritis Ear/Nose/Mouth/Throat Denies history of Chronic sinus problems/congestion, Middle ear problems Hematologic/Lymphatic Denies history of Anemia, Hemophilia, Human Immunodeficiency Virus, Lymphedema, Sickle Cell Disease Respiratory Patient has history of Asthma, Sleep Apnea Denies history of Aspiration, Chronic Obstructive Pulmonary Disease (COPD), Pneumothorax, Tuberculosis Cardiovascular Patient has history of Deep Vein Thrombosis - LLE 2010, Hypertension, Peripheral Venous  Disease Denies history of Angina, Arrhythmia, Congestive Heart Failure, Coronary Artery Disease, Hypotension, Myocardial Infarction, Peripheral Arterial Disease, Phlebitis, Vasculitis Gastrointestinal Denies history of Cirrhosis , Colitis, Crohn s, Hepatitis A, Hepatitis B, Hepatitis C Endocrine Denies history of Type I Diabetes, Type II Diabetes Genitourinary Denies history of End Stage Renal Disease Immunological Denies history of Lupus Erythematosus, Raynaud s, Scleroderma Integumentary (Skin) Denies history of History of Burn, History of pressure wounds Musculoskeletal Patient has history of Osteoarthritis - neck Denies history of Gout, Rheumatoid Arthritis, Osteomyelitis Neurologic Denies history of Dementia, Neuropathy, Quadriplegia, Paraplegia, Seizure Disorder Oncologic Patient has history of Received Chemotherapy - interfeon immunotherapy, Received Radiation Psychiatric Denies history of Anorexia/bulimia, Confinement Anxiety Hospitalization/Surgery History - Melanoma sx. Medical And Surgical History Notes Constitutional Symptoms (General Health) [name], [name] Vein ablation LLE 2010 Vascular Sx ( vein ablationo) LLE 02/2015 Eyes Horners syndrome Genitourinary Kidney stones Neurologic ct scan showed swollen lymph nodes Oncologic Melanoma (R) shoulder 07/2010; (R) axillary lymphnode removal Review of Systems (ROS) Constitutional  Symptoms (General Health) Denies complaints or symptoms of Fatigue, Fever, Chills, Marked Weight Change. Respiratory Denies complaints or symptoms of Chronic or frequent coughs, Shortness of Breath. Cardiovascular Complains or has symptoms of LE edema. Denies complaints or symptoms of Chest pain. Psychiatric Denies complaints or symptoms of Anxiety, Claustrophobia. Objective Constitutional [name] and well-hydrated in no acute distress. Vitals Time Taken: 1:58 PM, Height: 63 in, Weight: 224.7 lbs, BMI: 39.8,  Temperature: 98.5 F, Pulse: 90 bpm, Respiratory Rate: 16 breaths/min, Blood Pressure: 148/76 mmHg. Respiratory normal breathing without difficulty. clear to auscultation bilaterally. Cardiovascular regular rate and rhythm with normal S1, S2. Psychiatric this patient is able to make decisions and demonstrates good insight into disease process. Alert and Oriented x 3. pleasant and cooperative. General Notes: Patient unfortunately continues to have exquisite tenderness in regard to her wound at this time. That is very unfortunate and in fact does have me concerned about the possibility of infection although it could also be allergic reaction that she is experiencing. Nonetheless she has done okay with the Sorbact in the past. I did obtain a wound culture today as well and we of course will see what that shows and make any adjustments as necessary in the meantime I am going to go ahead and prescribe Augmentin for her. Integumentary (Hair, Skin) Wound #5 status is Open. Original cause of wound was Gradually Appeared. The wound is located on the Left,Medial Lower Leg. The wound measures 6cm length x 3cm width x 0.2cm depth; 14.137cm^2 area and 2.827cm^3 volume. [name], [name] Wound #6 status is Open. Original cause of wound was Gradually Appeared. The wound is located on the Left,Distal,Lateral Lower Leg. The wound measures 3.5cm length x 4.5cm width x 0.2cm depth; 12.37cm^2 area and 2.474cm^3 volume. Wound #7 status is Open. Original cause of wound was Gradually Appeared. The wound is located on the Left,Proximal,Lateral Lower Leg. The wound measures 3cm length x 4cm width x 0.1cm depth; 9.425cm^2 area and 0.942cm^3 volume. Wound #8 status is Open. Original cause of wound was Gradually Appeared. The wound is located on the Left,Lateral Lower Leg. The wound measures 4cm length x 3cm width x 0.1cm depth; 9.425cm^2 area and 0.942cm^3 volume. Wound #9 status is Open. Original cause of  wound was Gradually Appeared. The wound is located on the Left,Lateral,Posterior Lower Leg. The wound measures 4cm length x 4cm width x 0.1cm depth; 12.566cm^2 area and 1.257cm^3 volume. Assessment Active Problems ICD-10 Non-pressure chronic ulcer of left calf limited to breakdown of skin Chronic venous hypertension (idiopathic) with inflammation of right lower extremity Lymphedema, not elsewhere classified Plan Wound Cleansing: Wound #5 Left,Medial Lower Leg: Clean wound with Normal Saline. May Shower, gently pat wound dry prior to applying new dressing. Wound #6 Left,Distal,Lateral Lower Leg: Clean wound with Normal Saline. May Shower, gently pat wound dry prior to applying new dressing. Wound #7 Left,Proximal,Lateral Lower Leg: Clean wound with Normal Saline. May Shower, gently pat wound dry prior to applying new dressing. Wound #8 Left,Lateral Lower Leg: Clean wound with Normal Saline. May Shower, gently pat wound dry prior to applying new dressing. Wound #9 Left,Lateral,Posterior Lower Leg: Clean wound with Normal Saline. May Shower, gently pat wound dry prior to applying new dressing. Anesthetic (add to Medication List): Wound #5 Left,Medial Lower Leg: Topical Lidocaine 4% cream applied to wound bed prior to debridement (In Clinic Only). Benzocaine Topical Anesthetic Spray applied to wound bed prior to debridement (In Clinic Only). Primary Wound Dressing: Wound #5 Left,Medial Lower Leg: Other: - Sorbact  Wound #6 Left,Distal,Lateral Lower Leg: Other: - Sorbact Davie, Jizelle J. ([id number]) Wound #7 Left,Proximal,Lateral Lower Leg: Other: - Sorbact Wound #8 Left,Lateral Lower Leg: Other: - Sorbact Wound #9 Left,Lateral,Posterior Lower Leg: Other: - Sorbact Secondary Dressing: Wound #5 Left,Medial Lower Leg: Conform/Kerlix Wound #6 Left,Distal,Lateral Lower Leg: Conform/Kerlix Wound #7 Left,Proximal,Lateral Lower Leg: Conform/Kerlix Wound #8 Left,Lateral Lower  Leg: Conform/Kerlix Wound #9 Left,Lateral,Posterior Lower Leg: Conform/Kerlix Follow-up Appointments: Wound #5 Left,Medial Lower Leg: Return Appointment in 1 week. - Wednesday September 16th Nurse Visit as needed Wound #6 Left,Distal,Lateral Lower Leg: Return Appointment in 1 week. - Wednesday September 16th Nurse Visit as needed Wound #7 Left,Proximal,Lateral Lower Leg: Return Appointment in 1 week. - Wednesday September 16th Nurse Visit as needed Wound #8 Left,Lateral Lower Leg: Return Appointment in 1 week. - Wednesday September 16th Nurse Visit as needed Wound #9 Left,Lateral,Posterior Lower Leg: Return Appointment in 1 week. - Wednesday September 16th Nurse Visit as needed Edema Control: Wound #5 Left,Medial Lower Leg: Other: - Tubi Grip "F" Off-Loading: Wound #5 Left,Medial Lower Leg: Other: - Elevate legs as needed Wound #6 Left,Distal,Lateral Lower Leg: Other: - Elevate legs as needed Wound #7 Left,Proximal,Lateral Lower Leg: Other: - Elevate legs as needed Wound #8 Left,Lateral Lower Leg: Other: - Elevate legs as needed Wound #9 Left,Lateral,Posterior Lower Leg: Other: - Elevate legs as needed Laboratory ordered were: Wound culture routine - Left lateral Lower Leg, Infection The following medication(s) was prescribed: Augmentin oral 875 mg-125 mg tablet 1 1 tablet oral taken 2 times a day for 10 days starting 07/20/2019 1. I did send in a prescription for Augmentin due to the possibility of infection based on what I am seeing today. 2. I did also suggest that we avoid any new dressings to the wound bed for the time being we will continue with the Sorbact at this time. [name], AMENDT ([id number]) 3. Were also going to use Kerlix to secure the dressings and Tubigrip for the time being as she seems to be tolerating that. 4. I do recommend the patient elevate her legs as much as possible to help with edema control as well. We will see patient back for reevaluation in  2 days here in the clinic. If anything worsens or changes patient will contact our office for additional recommendations. Electronic Signature(s) Signed: 07/20/2019 5:24:31 PM By: Worthy [place] PA-C Entered By: Worthy [place] on 07/20/2019 17:24:31 Weins, [name] ([id number]) -------------------------------------------------------------------------------- ROS/PFSH Details Patient Name: SARIBEL, DEWAR. Date of Service: 07/20/2019 1:30 PM Medical Record Number: [id number] Patient Account Number: [id number] Date of Birth/Sex: 05-Oct-1946 (73 y.o. F) Treating RN: Harold Barban Primary Care Provider: [name] Other Clinician: Referring Provider: [name] Treating Provider/Extender: STONE III, HOYT Weeks in Treatment: 31 Label Progress Note Print Version as History and Physical for this encounter Information Obtained From Patient Constitutional Symptoms (General Health) Complaints and Symptoms: Negative for: Fatigue; Fever; Chills; Marked Weight Change Medical History: Past Medical History Notes: Vein ablation LLE 2010 Vascular Sx ( vein ablationo) LLE 02/2015 Respiratory Complaints and Symptoms: Negative for: Chronic or frequent coughs; Shortness of Breath Medical History: Positive for: Asthma; Sleep Apnea Negative for: Aspiration; Chronic Obstructive Pulmonary Disease (COPD); Pneumothorax; Tuberculosis Cardiovascular Complaints and Symptoms: Positive for: LE edema Negative for: Chest pain Medical History: Positive for: Deep Vein Thrombosis - LLE 2010; Hypertension; Peripheral Venous Disease Negative for: Angina; Arrhythmia; Congestive Heart Failure; Coronary Artery Disease; Hypotension; Myocardial Infarction; Peripheral Arterial Disease; Phlebitis; Vasculitis Psychiatric Complaints and Symptoms: Negative for: Anxiety; Claustrophobia Medical History:  Negative for: Anorexia/bulimia; Confinement Anxiety Eyes Medical History: Positive for: Cataracts Negative  for: Glaucoma; Optic Neuritis Past Medical History Notes: Horners syndrome LEE-ANNE, LEIGHT ([id number]) Ear/Nose/Mouth/Throat Medical History: Negative for: Chronic sinus problems/congestion; Middle ear problems Hematologic/Lymphatic Medical History: Negative for: Anemia; Hemophilia; Human Immunodeficiency Virus; Lymphedema; Sickle Cell Disease Gastrointestinal Medical History: Negative for: Cirrhosis ; Colitis; Crohnos; Hepatitis A; Hepatitis B; Hepatitis C Endocrine Medical History: Negative for: Type I Diabetes; Type II Diabetes Genitourinary Medical History: Negative for: End Stage Renal Disease Past Medical History Notes: Kidney stones Immunological Medical History: Negative for: Lupus Erythematosus; Raynaudos; Scleroderma Integumentary (Skin) Medical History: Negative for: History of Burn; History of pressure wounds Musculoskeletal Medical History: Positive for: Osteoarthritis - neck Negative for: Gout; Rheumatoid Arthritis; Osteomyelitis Neurologic Medical History: Negative for: Dementia; Neuropathy; Quadriplegia; Paraplegia; Seizure Disorder Past Medical History Notes: ct scan showed swollen lymph nodes Oncologic Medical History: Positive for: Received Chemotherapy - interfeon immunotherapy; Received Radiation Past Medical History Notes: Melanoma (R) shoulder 07/2010; (R) axillary lymphnode removal HBO Extended History Items Eyes: Cataracts CHARLIANN, MEHRENS ([id number]) Immunizations Pneumococcal Vaccine: Received Pneumococcal Vaccination: Yes Immunization Notes: tetanus shot w/in the last 5 years per pt Implantable Devices No devices added Hospitalization / Surgery History Type of Hospitalization/Surgery Melanoma sx Family and Social History Cancer: Yes - Mother,Paternal Grandparents; Diabetes: No; Heart Disease: No; Hereditary Spherocytosis: No; Hypertension: Yes - Mother,Father; Kidney Disease: Yes - Paternal Grandparents; Lung Disease: Yes -  Paternal Grandparents; Seizures: No; Stroke: No; Thyroid Problems: No; Tuberculosis: No; Former smoker - quit at age 42; Marital Status - Divorced; Alcohol Use: Never; Drug Use: No History; Caffeine Use: Daily; Financial Concerns: No; Food, Clothing or Shelter Needs: No; Support System Lacking: No; Transportation Concerns: No Physician Affirmation I have reviewed and agree with the above information. Electronic Signature(s) Signed: 07/20/2019 5:45:18 PM By: Worthy [place] PA-C Signed: 07/21/2019 3:44:11 PM By: Harold Barban Entered By: Worthy [place] on 07/20/2019 17:23:14 Gater, [name] -------------------------------------------------------------------------------- SuperBill Details Patient Name: NAVEAH, NEVEU. Date of Service: 07/20/2019 Medical Record Number: [id number] Patient Account Number: [id number] Date of Birth/Sex: 25-May-1946 (73 y.o. F) Treating RN: Harold Barban Primary Care Provider: [name] Other Clinician: Referring Provider: [name] Treating Provider/Extender: Melburn Hake, HOYT Weeks in Treatment: 31 Diagnosis Coding ICD-10 Codes Code Description L97.221 Non-pressure chronic ulcer of left calf limited to breakdown of skin I87.321 Chronic venous hypertension (idiopathic) with inflammation of right lower extremity I89.0 Lymphedema, not elsewhere classified Facility Procedures CPT4 Code: [id number] Description: [id number] - WOUND CARE VISIT-LEV 5 EST PT Modifier: Quantity: 1 Physician Procedures CPT4 Code Description: [id number] - WC PHYS LEVEL 4 - EST PT ICD-10 Diagnosis Description L97.221 Non-pressure chronic ulcer of left calf limited to breakdown I87.321 Chronic venous hypertension (idiopathic) with inflammation of I89.0 Lymphedema, not  elsewhere classified Modifier: of skin right lower extr Quantity: 1 emity Electronic Signature(s) Signed: 07/21/2019 3:14:30 PM By: Sharon Mt Previous Signature: 07/20/2019 5:24:59 PM Version  By: Worthy [place] PA-C Entered By: Sharon Mt on 07/21/2019 15:14:30

## 2019-07-21 NOTE — Progress Notes (Signed)
[name] Visit Report for 07/20/2019 Arrival Information Details Patient Name: [name], [name]. Date of Service: 07/20/2019 1:30 PM Medical Record Number: [id number] Patient Account Number: [id number] Date of Birth/Sex: 07-Feb-1946 (73 y.o. F) Treating RN: [name] Primary Care [name]: [name] Other Clinician: Referring [name]: [name] Treating [name]/Extender: [name], [name] in Treatment: 44 Visit Information History Since Last Visit Added or deleted any medications: No Patient Arrived: Ambulatory Any new allergies or adverse reactions: No Arrival Time: 13:50 Had a fall or experienced change in No Accompanied By: self activities of daily living that may affect Transfer Assistance: None risk of falls: Patient Identification Verified: Yes Signs or symptoms of abuse/neglect since last visito No Secondary Verification Process Yes Hospitalized since last visit: No Completed: Implantable device outside of the clinic excluding No Patient Requires Transmission-Based No cellular tissue based products placed in the center Precautions: since last visit: Patient Has Alerts: Yes Has Dressing in Place as Prescribed: Yes Patient Alerts: Patient on Blood Pain Present Now: Yes Thinner aspirin 81 Electronic Signature(s) Signed: 07/21/2019 1:29:00 PM By: [name], [name], RN, CWS, [name], [name] Entered By: [name], [name], RN, CWS, [name] on 07/20/2019 13:51:32 [name] -------------------------------------------------------------------------------- Clinic Level of Care Assessment Details Patient Name: [name], [name] Date of Service: 07/20/2019 1:30 PM Medical Record Number: [id number] Patient Account Number: [id number] Date of Birth/Sex: 1946/07/27 (73 y.o. F) Treating RN: [name] Primary Care [name]: [name] Other Clinician: Referring [name]: [name] Treating [name]/Extender: [name], [name]  in Treatment: 31 Clinic Level of Care Assessment Items TOOL 4 Quantity Score []  - Use when only an EandM is performed on FOLLOW-UP visit 0 ASSESSMENTS - Nursing Assessment / Reassessment X - Reassessment of Co-morbidities (includes updates in patient status) 1 10 X- 1 5 Reassessment of Adherence to Treatment Plan ASSESSMENTS - Wound and Skin Assessment / Reassessment []  - Simple Wound Assessment / Reassessment - one wound 0 X- 5 5 Complex Wound Assessment / Reassessment - multiple wounds []  - 0 Dermatologic / Skin Assessment (not related to wound area) ASSESSMENTS - Focused Assessment []  - Circumferential Edema Measurements - multi extremities 0 []  - 0 Nutritional Assessment / Counseling / Intervention []  - 0 Lower Extremity Assessment (monofilament, tuning fork, pulses) []  - 0 Peripheral Arterial Disease Assessment (using hand held doppler) ASSESSMENTS - Ostomy and/or Continence Assessment and Care []  - Incontinence Assessment and Management 0 []  - 0 Ostomy Care Assessment and Management (repouching, etc.) PROCESS - Coordination of Care X - Simple Patient / Family Education for ongoing care 1 15 []  - 0 Complex (extensive) Patient / Family Education for ongoing care []  - 0 Staff obtains Programmer, systems, Records, Test Results / Process Orders []  - 0 Staff telephones HHA, Nursing Homes / Clarify orders / etc []  - 0 Routine Transfer to another Facility (non-emergent condition) []  - 0 Routine Hospital Admission (non-emergent condition) []  - 0 New Admissions / Biomedical engineer / Ordering NPWT, Apligraf, etc. []  - 0 Emergency Hospital Admission (emergent condition) X- 1 10 Simple Discharge Coordination [name], [name] []  - 0 Complex (extensive) Discharge Coordination PROCESS - Special Needs []  - Pediatric / Minor Patient Management 0 []  - 0 Isolation Patient Management []  - 0 Hearing / Language / Visual special needs []  - 0 Assessment of Community  assistance (transportation, D/C planning, etc.) []  - 0 Additional assistance / Altered mentation []  - 0 Support Surface(s) Assessment (bed, cushion, seat, etc.) INTERVENTIONS - Wound Cleansing / Measurement []  -  Simple Wound Cleansing - one wound 0 X- 5 5 Complex Wound Cleansing - multiple wounds X- 1 5 Wound Imaging (photographs - any number of wounds) []  - 0 Wound Tracing (instead of photographs) []  - 0 Simple Wound Measurement - one wound X- 5 5 Complex Wound Measurement - multiple wounds INTERVENTIONS - Wound Dressings []  - Small Wound Dressing one or multiple wounds 0 X- 5 15 Medium Wound Dressing one or multiple wounds []  - 0 Large Wound Dressing one or multiple wounds []  - 0 Application of Medications - topical []  - 0 Application of Medications - injection INTERVENTIONS - Miscellaneous []  - External ear exam 0 []  - 0 Specimen Collection (cultures, biopsies, blood, body fluids, etc.) X- 1 5 Specimen(s) / Culture(s) sent or taken to Lab for analysis []  - 0 Patient Transfer (multiple staff / Civil Service fast streamer / Similar devices) []  - 0 Simple Staple / Suture removal (25 or less) []  - 0 Complex Staple / Suture removal (26 or more) []  - 0 Hypo / Hyperglycemic Management (close monitor of Blood Glucose) []  - 0 Ankle / Brachial Index (ABI) - do not check if billed separately X- 1 5 Vital Signs [name], [name] ([id number]) Has the patient been seen at the hospital within the last three years: Yes Total Score: 205 Level Of Care: New/Established - Level 5 Electronic Signature(s) Signed: 07/20/2019 4:31:37 PM By: [name] Entered By: [name] on 07/20/2019 14:40:51 Barbee, Tenna [name] ([id number]) -------------------------------------------------------------------------------- Encounter Discharge Information Details Patient Name: [name], [name] Date of Service: 07/20/2019 1:30 PM Medical Record Number: [id number] Patient Account Number: [id number] Date of  Birth/Sex: [birth year] (73 y.o. F) Treating RN: [name] Primary Care Hortencia Martire: [name] Other Clinician: Referring Tramel Westbrook: [name] Treating Norie Latendresse/Extender: [name], [name] in Treatment: 69 Encounter Discharge Information Items Discharge Condition: Stable Ambulatory Status: Ambulatory Discharge Destination: Home Transportation: Private Auto Accompanied By: self Schedule Follow-up Appointment: Yes Clinical Summary of Care: Electronic Signature(s) Signed: 07/20/2019 4:31:37 PM By: [name] Entered By: [name] on 07/20/2019 14:45:57 Vea, Tenna [name] ([id number]) -------------------------------------------------------------------------------- Lower Extremity Assessment Details Patient Name: [name], BOYE. Date of Service: 07/20/2019 1:30 PM Medical Record Number: [id number] Patient Account Number: [id number] Date of Birth/Sex: [name] 03, 1946 (73 y.o. F) Treating RN: [name] Primary Care Darriana Deboy: [name] Other Clinician: Referring Lyanne Kates: [name] Treating Domique Reardon/Extender: [name], [name] in Treatment: 31 Edema Assessment Assessed: [Left: Yes] [Right: No] Edema: [Left: Ye] [Right: s] Calf Left: Right: Point of Measurement: 31 cm From Medial Instep 44 cm cm Ankle Left: Right: Point of Measurement: 12 cm From Medial Instep 23.4 cm cm Vascular Assessment Pulses: Dorsalis Pedis Palpable: [Left:Yes] Electronic Signature(s) Signed: 07/21/2019 1:29:00 PM By: [name], [name], RN, CWS, [name], [name] Entered By: [name], [name], RN, CWS, [name] on 07/20/2019 14:08:27 ARIEAL, LEMIN ([id number]) -------------------------------------------------------------------------------- Multi Wound Chart Details Patient Name: [name], TURNBAUGH. Date of Service: 07/20/2019 1:30 PM Medical Record Number: [id number] Patient Account Number: [id number] Date of Birth/Sex: 1946-09-24 (73 y.o. F) Treating RN: [name] Primary Care Skylynne Schlechter:  [name] Other Clinician: Referring Sullivan Jacuinde: [name] Treating Kalen Neidert/Extender: STONE III, [name] in Treatment: 31 Vital Signs Height(in): 63 Pulse(bpm): 90 Weight(lbs): 224.7 Blood Pressure(mmHg): 148/76 Body Mass Index(BMI): 40 Temperature(F): 98.5 Respiratory Rate 16 (breaths/min): Photos: [5:No Photos] [6:No Photos] [7:No Photos] Wound Location: [5:Left, Medial Lower Leg] [6:Left, Distal, Lateral Lower Leg Left, Proximal, Lateral Lower] [7:Leg] Wounding Event: [5:Gradually Appeared] [6:Gradually Appeared] [7:Gradually Appeared] Primary Etiology: [5:Lymphedema] [6:Venous Leg Ulcer] [7:Venous Leg  Ulcer] Date Acquired: [5:11/19/2018] [6:01/19/2019] [7:07/08/2019] [name] of Treatment: [5:31] [6:26] [7:1] Wound Status: [5:Open] [6:Open] [7:Open] Measurements L x W x D [5:6x3x0.2] [6:3.5x4.5x0.2] [7:3x4x0.1] (cm) Area (cm) : [5:14.137] [6:12.37] [7:9.425] Volume (cm) : [5:2.827] [6:2.474] [7:0.942] % Reduction in Area: [5:-65.40%] [6:-5522.70%] [7:-148.50%] % Reduction in Volume: [5:-230.60%] [6:-11145.50%] [7:-148.50%] Classification: [5:Full Thickness Without Exposed Support Structures] [6:Full Thickness Without Exposed Support Structures] [7:Full Thickness Without Exposed Support Structures] Wound Number: 8 9 N/A Photos: No Photos No Photos N/A Wound Location: Left, Lateral Lower Leg Left, Lateral, Posterior Lower N/A Leg Wounding Event: Gradually Appeared Gradually Appeared N/A Primary Etiology: Venous Leg Ulcer Venous Leg Ulcer N/A Date Acquired: 07/08/2019 07/08/2019 N/A [name] of Treatment: 1 1 N/A Wound Status: Open Open N/A Measurements L x W x D 4x3x0.1 4x4x0.1 N/A (cm) Area (cm) : 9.425 12.566 N/A Volume (cm) : 0.942 1.257 N/A % Reduction in Area: -294.70% -2124.10% N/A % Reduction in Volume: -294.10% -2105.30% N/A Classification: Full Thickness Without Full Thickness Without N/A Exposed Support Structures Exposed Support Structures Treatment  Notes [name], [name] Electronic Signature(s) Signed: 07/20/2019 4:31:37 PM By: [name] Entered By: [name] on 07/20/2019 14:32:33 Swoyer, Tenna [name] ([id number]) -------------------------------------------------------------------------------- [place] Details Patient Name: SHAUNETTA, BAGGE. Date of Service: 07/20/2019 1:30 PM Medical Record Number: [id number] Patient Account Number: [id number] Date of Birth/Sex: 08-May-1946 (73 y.o. F) Treating RN: [name] Primary Care Blondina Coderre: [name] Other Clinician: Referring Inger Wiest: [name] Treating Hamed Debella/Extender: [name], [name] in Treatment: 70 Active Inactive Orientation to the Wound Care Program Nursing Diagnoses: Knowledge deficit related to the wound healing center program Goals: Patient/caregiver will verbalize understanding of the [facility] Date Initiated: 12/10/2018 Target Resolution Date: 01/09/2019 Goal Status: Active Interventions: Provide education on orientation to the wound center Notes: Soft Tissue Infection Nursing Diagnoses: Impaired tissue integrity Goals: Patient's soft tissue infection will resolve Date Initiated: 12/10/2018 Target Resolution Date: 01/09/2019 Goal Status: Active Interventions: Assess signs and symptoms of infection every visit Notes: Venous Leg Ulcer Nursing Diagnoses: Actual venous Insuffiency (use after diagnosis is confirmed) Goals: Patient will maintain optimal edema control Date Initiated: 12/10/2018 Target Resolution Date: 01/09/2019 Goal Status: Active Interventions: Assess peripheral edema status every visit. [name], [name] Treatment Activities: Therapeutic compression applied : 12/10/2018 Notes: Wound/Skin Impairment Nursing Diagnoses: Impaired tissue integrity Goals: Patient/caregiver will verbalize understanding of skin care regimen Date Initiated: 12/10/2018 Target  Resolution Date: 01/09/2019 Goal Status: Active Interventions: Assess ulceration(s) every visit Treatment Activities: Topical wound management initiated : 12/10/2018 Notes: Electronic Signature(s) Signed: 07/20/2019 4:31:37 PM By: [name] Entered By: [name] on 07/20/2019 14:32:21 Colasanti, Tenna [name] ([id number]) -------------------------------------------------------------------------------- Pain Assessment Details Patient Name: [name], OBERHAUSEN. Date of Service: 07/20/2019 1:30 PM Medical Record Number: [id number] Patient Account Number: [id number] Date of Birth/Sex: 14-May-1946 (73 y.o. F) Treating RN: [name] Primary Care Mammie Meras: [name] Other Clinician: Referring Jossiah Smoak: [name] Treating Tamorah Hada/Extender: [name], [name] in Treatment: 31 Active Problems Location of Pain Severity and Description of Pain Patient Has Paino Yes Site Locations With Dressing Change: Yes Duration of the Pain. Constant / Intermittento Constant Rate the pain. Current Pain Level: 5 Character of Pain Describe the Pain: Burning Pain Management and Medication Current Pain Management: Electronic Signature(s) Signed: 07/21/2019 1:29:00 PM By: [name], [name], RN, CWS, [name], [name] Entered By: [name], [name], RN, CWS, [name] on 07/20/2019 13:51:54 [name] -------------------------------------------------------------------------------- Patient/Caregiver Education Details Patient Name: [name], KORNHAUSER. Date of Service: 07/20/2019 1:30 PM  Medical Record Number: [id number] Patient Account Number: [id number] Date of Birth/Gender: Mar 12, 1946 (73 y.o. F) Treating RN: [name] Primary Care Physician: [name] Other Clinician: Referring Physician: Ria [name] Treating Physician/Extender: Sharalyn Ink in Treatment: 31 Education Assessment Education Provided To: Patient Education Topics Provided Wound/Skin Impairment: Handouts:  Caring for Your Ulcer Methods: Demonstration, Explain/Verbal Responses: State content correctly Electronic Signature(s) Signed: 07/20/2019 4:31:37 PM By: [name] Entered By: [name] on 07/20/2019 14:32:56 Doeden, Tenna [name] ([id number]) -------------------------------------------------------------------------------- Wound Assessment Details Patient Name: OLIVEAH, WOLFGRAM. Date of Service: 07/20/2019 1:30 PM Medical Record Number: [id number] Patient Account Number: [id number] Date of Birth/Sex: 1946-04-15 (73 y.o. F) Treating RN: [name] Primary Care Ashely Goosby: [name] Other Clinician: Referring Jadyn Brasher: [name] Treating Malachai Schalk/Extender: STONE III, [name] in Treatment: 31 Wound Status Wound Number: 5 Primary Etiology: Lymphedema Wound Location: Left, Medial Lower Leg Wound Status: Open Wounding Event: Gradually Appeared Date Acquired: 11/19/2018 [name] Of Treatment: 31 Clustered Wound: No Photos Photo Uploaded By: [name], [name], RN, CWS, [name] on 07/20/2019 15:24:35 Wound Measurements Length: (cm) 6 Width: (cm) 3 Depth: (cm) 0.2 Area: (cm) 14.137 Volume: (cm) 2.827 % Reduction in Area: -65.4% % Reduction in Volume: -230.6% Wound Description Full Thickness Without Exposed Support Classification: Structures Treatment Notes Wound #5 (Left, Medial Lower Leg) Notes Sorbact, Xtrasorb, Conform, Tubi Grip "F" Electronic Signature(s) Signed: 07/21/2019 1:29:00 PM By: [name], [name], RN, CWS, [name], [name] Entered By: [name], [name], RN, CWS, [name] on 07/20/2019 14:04:53 Mesick, Tenna [name] ([id number]) -------------------------------------------------------------------------------- Wound Assessment Details Patient Name: MADDEN, PELZEL. Date of Service: 07/20/2019 1:30 PM Medical Record Number: [id number] Patient Account Number: [id number] Date of Birth/Sex: [birth year] (73 y.o. F) Treating RN: [name] Primary Care Chevi Lim: [name] Other  Clinician: Referring Lorra Freeman: [name] Treating Kallee Nam/Extender: STONE III, [name] in Treatment: 31 Wound Status Wound Number: 6 Primary Etiology: Venous Leg Ulcer Wound Location: Left, Distal, Lateral Lower Leg Wound Status: Open Wounding Event: Gradually Appeared Date Acquired: 01/19/2019 [name] Of Treatment: 26 Clustered Wound: No Photos Photo Uploaded By: [name], [name], RN, CWS, [name] on 07/20/2019 15:24:35 Wound Measurements Length: (cm) 3.5 Width: (cm) 4.5 Depth: (cm) 0.2 Area: (cm) 12.37 Volume: (cm) 2.474 % Reduction in Area: -5522.7% % Reduction in Volume: -11145.5% Wound Description Full Thickness Without Exposed Support Classification: Structures Treatment Notes Wound #6 (Left, Distal, Lateral Lower Leg) Notes Sorbact, Xtrasorb, Conform, Tubi Grip "F" Electronic Signature(s) Signed: 07/21/2019 1:29:00 PM By: [name], [name], RN, CWS, [name], [name] Entered By: [name], [name], RN, CWS, [name] on 07/20/2019 14:04:54 Yeagley, Tenna [name] ([id number]) -------------------------------------------------------------------------------- Wound Assessment Details Patient Name: KRYSTELLE, FOSKETT. Date of Service: 07/20/2019 1:30 PM Medical Record Number: [id number] Patient Account Number: [id number] Date of Birth/Sex: 13-Oct-1946 (73 y.o. F) Treating RN: [name] Primary Care Jaymian Bogart: [name] Other Clinician: Referring Sunny Gains: [name] Treating Brookelynn Hamor/Extender: STONE III, [name] in Treatment: 31 Wound Status Wound Number: 7 Primary Etiology: Venous Leg Ulcer Wound Location: Left, Proximal, Lateral Lower Leg Wound Status: Open Wounding Event: Gradually Appeared Date Acquired: 07/08/2019 [name] Of Treatment: 1 Clustered Wound: No Photos Photo Uploaded By: [name], [name], RN, CWS, [name] on 07/20/2019 15:25:36 Wound Measurements Length: (cm) 3 Width: (cm) 4 Depth: (cm) 0.1 Area: (cm) 9.425 Volume: (cm) 0.942 % Reduction in Area: -148.5% % Reduction in  Volume: -148.5% Wound Description Full Thickness Without Exposed Support Classification: Structures Treatment Notes Wound #7 (Left, Proximal, Lateral Lower Leg) Notes Sorbact, Xtrasorb, Conform, Tubi Grip "F" Electronic Signature(s) Signed: 07/21/2019 1:29:00 PM By:  [name], [name], RN, CWS, Leisure centre manager, [name] Entered By: [name], [name], RN, CWS, [name] on 07/20/2019 14:04:54 [name] -------------------------------------------------------------------------------- Wound Assessment Details Patient Name: NOELIE, NUNNERY. Date of Service: 07/20/2019 1:30 PM Medical Record Number: [id number] Patient Account Number: [id number] Date of Birth/Sex: 1946/02/04 (73 y.o. F) Treating RN: [name] Primary Care Laquitha Heslin: [name] Other Clinician: Referring Burnice Oestreicher: [name] Treating Twisha Vanpelt/Extender: STONE III, [name] in Treatment: 31 Wound Status Wound Number: 8 Primary Etiology: Venous Leg Ulcer Wound Location: Left, Lateral Lower Leg Wound Status: Open Wounding Event: Gradually Appeared Date Acquired: 07/08/2019 [name] Of Treatment: 1 Clustered Wound: No Photos Photo Uploaded By: [name], [name], RN, CWS, [name] on 07/20/2019 15:25:37 Wound Measurements Length: (cm) 4 Width: (cm) 3 Depth: (cm) 0.1 Area: (cm) 9.425 Volume: (cm) 0.942 % Reduction in Area: -294.7% % Reduction in Volume: -294.1% Wound Description Full Thickness Without Exposed Support Classification: Structures Treatment Notes Wound #8 (Left, Lateral Lower Leg) Notes Sorbact, Xtrasorb, Conform, Tubi Grip "F" Electronic Signature(s) Signed: 07/21/2019 1:29:00 PM By: [name], [name], RN, CWS, [name], [name] Entered By: [name], [name], RN, CWS, [name] on 07/20/2019 14:04:54 Halloran, Tenna [name] ([id number]) -------------------------------------------------------------------------------- Wound Assessment Details Patient Name: AKEILA, YAROSH. Date of Service: 07/20/2019 1:30 PM Medical Record Number: [id number] Patient  Account Number: [id number] Date of Birth/Sex: 06-09-1946 (73 y.o. F) Treating RN: [name] Primary Care Federico Maiorino: [name] Other Clinician: Referring Syaire Saber: [name] Treating Ronnisha Felber/Extender: STONE III, [name] in Treatment: 31 Wound Status Wound Number: 9 Primary Etiology: Venous Leg Ulcer Wound Location: Left, Lateral, Posterior Lower Leg Wound Status: Open Wounding Event: Gradually Appeared Date Acquired: 07/08/2019 [name] Of Treatment: 1 Clustered Wound: No Photos Photo Uploaded By: [name], [name], RN, CWS, [name] on 07/20/2019 15:28:11 Wound Measurements Length: (cm) 4 Width: (cm) 4 Depth: (cm) 0.1 Area: (cm) 12.566 Volume: (cm) 1.257 % Reduction in Area: -2124.1% % Reduction in Volume: -2105.3% Wound Description Full Thickness Without Exposed Support Classification: Structures Treatment Notes Wound #9 (Left, Lateral, Posterior Lower Leg) Notes Sorbact, Xtrasorb, Conform, Tubi Grip "F" Electronic Signature(s) Signed: 07/21/2019 1:29:00 PM By: [name], [name], RN, CWS, [name], [name] Entered By: [name], [name], RN, CWS, [name] on 07/20/2019 14:04:55 Gutierrez, Tenna [name] ([id number]) -------------------------------------------------------------------------------- Vitals Details Patient Name: ROSSE, KNIPE. Date of Service: 07/20/2019 1:30 PM Medical Record Number: [id number] Patient Account Number: [id number] Date of Birth/Sex: 1945/11/12 (73 y.o. F) Treating RN: [name] Primary Care Dearion Huot: [name] Other Clinician: Referring Treesa Mccully: [name] Treating Negin Hegg/Extender: STONE III, [name] in Treatment: 31 Vital Signs Time Taken: 13:58 Temperature (F): 98.5 Height (in): 63 Pulse (bpm): 90 Weight (lbs): 224.7 Respiratory Rate (breaths/min): 16 Body Mass Index (BMI): 39.8 Blood Pressure (mmHg): 148/76 Reference Range: 80 - 120 mg / dl Electronic Signature(s) Signed: 07/20/2019 3:43:02 PM By: Lorine Bears RCP,  RRT, CHT Entered By: Lorine Bears on 07/20/2019 14:01:09

## 2019-07-22 ENCOUNTER — Encounter: Payer: Medicare Other | Admitting: Internal Medicine

## 2019-07-22 ENCOUNTER — Other Ambulatory Visit: Payer: Self-pay

## 2019-07-22 DIAGNOSIS — L97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d: Secondary | ICD-10-CM | POA: Diagnosis not present

## 2019-07-22 DIAGNOSIS — M1990 Unspecified osteoarthritis, unspecified site: Secondary | ICD-10-CM | POA: Diagnosis not present

## 2019-07-22 DIAGNOSIS — I890 Lymphedema, not elsewhere classified: Secondary | ICD-10-CM | POA: Diagnosis not present

## 2019-07-22 DIAGNOSIS — G4730 Sleep apnea, unspecified: Secondary | ICD-10-CM | POA: Diagnosis not present

## 2019-07-22 DIAGNOSIS — I87312 Chronic venous hypertension (idiopathic) with ulcer of left lower extremity: Secondary | ICD-10-CM | POA: Diagnosis not present

## 2019-07-22 DIAGNOSIS — L97322 Non-pressure chronic ulcer of left ankle with fat layer exposed: Secondary | ICD-10-CM | POA: Diagnosis not present

## 2019-07-22 DIAGNOSIS — I10 Essential (primary) hypertension: Secondary | ICD-10-CM | POA: Diagnosis not present

## 2019-07-22 DIAGNOSIS — L97222 Non-pressure chronic ulcer of left calf with fat layer exposed: Secondary | ICD-10-CM | POA: Diagnosis not present

## 2019-07-22 DIAGNOSIS — J45909 Unspecified asthma, uncomplicated: Secondary | ICD-10-CM | POA: Diagnosis not present

## 2019-07-22 DIAGNOSIS — L97221 Non-pressure chronic ulcer of left calf limited to breakdown of skin: Secondary | ICD-10-CM | POA: Diagnosis not present

## 2019-07-22 NOTE — Progress Notes (Signed)
[name], [name] ([id number]) Visit Report for 07/22/2019 HPI Details Patient Name: [name], [name]. Date of Service: 07/22/2019 12:45 PM Medical Record Number: [id number] Patient Account Number: [id number] Date of Birth/Sex: [birth year] (73 y.o. F) Treating RN: Cornell Barman Primary Care Provider: [name] Other Clinician: Referring Provider: [name] Treating Provider/Extender: Tito Dine in Treatment: 63 History of Present Illness HPI Description: Pleasant 73 year old with history of chronic venous insufficiency. No diabetes or peripheral vascular disease. Left ABI 1.29. Questionable history of left lower extremity DVT. She developed a recurrent ulceration on her left lateral calf in December 2015, which she attributes to poor diet and subsequent lower extremity edema. She underwent endovenous laser ablation of her left greater saphenous vein in 2010. She underwent laser ablation of accessory branch of left GSV in April 2016 by Dr. Kellie Simmering at [facility]. She was previously wearing Unna boots, which she tolerated well. Tolerating 2 layer compression and cadexomer iodine. She returns to clinic for follow-up and is without new complaints. She denies any significant pain at this time. She reports persistent pain with pressure. No claudication or ischemic rest pain. No fever or chills. No drainage. READMISSION 11/13/16; this is a 73 year old woman who is not a diabetic. She is here for a review of a painful area on her left medial lower extremity. I note that she was seen here previously last year for wound I believe to be in the same area. At that time she had undergone previously a left greater saphenous vein ablation by Dr. Kellie Simmering and she had a ablation of the anterior accessory branch of the left greater saphenous vein in March 2016. Seeing that the wound actually closed over. In reviewing the history with her today the ulcer in this area has been recurrent. She  describes a biopsy of this area in 2009 that only showed stasis physiology. 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 Do not see that she has ever had arterial studies in the left leg. She tells me she has a follow-up with Dr. Kellie Simmering in roughly 10 days In any case she developed the reopening of this area roughly a month ago. On the background of this she describes rapidly increasing edema which has responded to Lasix 40 mg and metolazone 2.5 mg as well as the patient's lymph massage. She has been told she has both venous insufficiency and lymphedema but she cannot tolerate compression stockings 11/28/16; the patient saw Dr. Kellie Simmering recently. Per the patient he did arterial Dopplers in the office that did not show evidence of arterial insufficiency, per the patient he stated "treat this like an ordinary venous ulcer". She also saw her dermatologist Dr. Ronnald Ramp who felt that this was more of a vascular ulcer. In general things are improving although she arrives today with increasing bilateral lower extremity edema with weeping a deeper fluid through the wound on the left medial leg compatible with some degree of lymphedema 12/04/16; the patient's wound is fully epithelialized but I don't think fully healed. We will do another week of depression with Promogran and TCA however I suspect we'll be able to discharge her next week. This is a very unusual-looking wound which was initially a figure-of-eight type wound lying on its side surrounded by petechial like hemorrhage. She  has had venous ablation on this side. She apparently does not have an arterial issue per Dr. Kellie Simmering. She saw her dermatologist thought it was "vascular". Patient is definitely going to need ongoing compression and I  talked about this with her today she will go to elastic therapy after she leaves here next week 12/11/16; the patient's wound is not completely closed today. She has surrounding scar tissue and in further discussion with the patient it would appear that she had ulcers in this area in 2009 for a prolonged period of time ultimately requiring a punch biopsy of this area that only showed venous insufficiency. I did not previously pickup on this part of the history from the patient. 12/18/16; the patient's wound is completely epithelialized. There is no open area here. She has significant bilateral venous insufficiency with secondary lymphedema to a mild-to-moderate degree she does not have compression stockings.. She did not say anything to me when I was in the room, she told our intake nurse that she was still having pain in this area. This isn't [name], [name] ([id number]) unusual recurrent small open area. She is going to go to elastic therapy to obtain compression stockings. 12/25/16; the patient's wound is fully epithelialized. There is no open area here. The patient describes some continued episodic discomfort in this area medial left calf. However everything looks fine and healed here. She is been to elastic therapy and caught herself 15-20 mmHg stockings, they apparently were having trouble getting 20-30 mm stockings in her size 01/22/17; this is a patient we discharged from the clinic a month ago. She has a recurrent open wound on her medial left calf. She had 15 mm support stockings. I told her I thought she needed 20-30 mm compression stockings. She tells me that she has been ill with hospitalization secondary to asthma and is been found to have severe hypokalemia likely secondary to a combination of Lasix and metolazone. This morning she noted blistering and leaking fluid on the posterior part of her left leg. She called our intake nurse urgently and we was saw her this afternoon. She has not  had any real discomfort here. I don't know that she's been wearing any stockings on this leg for at least 2-3 days. ABIs in this clinic were 1.21 on the right and 1.3 on the left. She is previously seen vascular surgery who does not think that there is a peripheral arterial issue. 01/30/17; Patient arrives with no open wound on the left leg. She has been to elastic therapy and obtained 20-75mmhg below knee stockings and she has one on the right leg today. READMISSION 02/19/18; this Forgacs is a now 73 year old patient we've had in this clinic perhaps 3 times before. I had last looked at her from January 07 December 2016 with an area on the medial left leg. We discharged her on 12/25/16 however she had to be readmitted on 01/22/17 with a recurrence. I have in my notes that we discharged her on 20-30 mm stockings although she tells me she was only wearing support hose because she cannot get stockings on predominantly related to her cervical spine surgery/issues. She has had previous ablations done by vein and vascular in [place] including a great saphenous vein ablation on the left with an anterior accessory branch ablation I think both of these were in 2016. On one of the previous visit she had a biopsy noted 2009 that was negative. She is not felt to have an arterial issue. She is not  a diabetic. She does have a history of obstructive sleep apnea hypertension asthma as well as chronic venous insufficiency and lymphedema. On this occasion she noted 2 dry scaly patch on her left leg. She tried to put lotion on this it didn't really help. There were 2 open areas.the patient has been seeing her primary physician from 02/05/18 through 02/14/18. She had Unna boots applied. The superior wound now on the lateral left leg has closed but she's had one wound that remains open on the lateral left leg. This is not the same spot as we dealt with in 2018. ABIs in this clinic were 1.3 bilaterally 02/26/18; patient has a  small wound on the left lateral calf. Dimensions are down. She has chronic venous insufficiency and lymphedema. 03/05/18; small open area on the left lateral calf. Dimensions are down. Tightly adherent necrotic debris over the surface of the wound which was difficult to remove. Also the dressing [over collagen] stuck to the wound surface. This was removed with some difficulty as well. Change the primary dressing to Hydrofera Blue ready 03/12/18; small open area on the left lateral calf. Comes in with tightly adherent surface eschar as well as some adherent Hydrofera Blue. 03/19/18; open area on the left lateral calf. Again adherent surface eschar as well as some adherent Hydrofera Blue nonviable subcutaneous tissue. She complained of pain all week even with the reduction from 4-3 layer compression I put on last week. Also she had an increase in her ankle and calf measurements probably related to the same thing. 03/26/18; open area on the left lateral calf. A very small open area remains here. We used silver alginate starting last week as the Hydrofera Blue seem to stick to the wound bed. In using 4-layer compression 04/02/18; the open area in the left lateral calf at some adherent slough which I removed there is no open area here. We are able to transition her into her own compression stocking. Truthfully I think this is probably his support hose. However this does not maintain skin integrity will be limited. She cannot put over the toe compression stockings on because of neck problems hand problems etc. She is allergic to the lining layer of juxta lites. We might be forced to use extremitease stocking should this fail READMIT 11/24/2018 Patient is now a 73 year old woman who is not a diabetic. She has been in this clinic on at least 3 previous occasions largely with recurrent wounds on her left leg secondary to chronic venous insufficiency with secondary lymphedema. Her situation is complicated by  inability to get stockings on and an allergy to neoprene which is apparently a component and at least juxta lites and other stockings. As a result she really has not been wearing any stockings on her legs. She tells [place] that roughly 2 or 3 weeks ago she started noticing a stinging sensation just above her ankle on the left medial aspect. She has been diagnosed with pseudogout and she wondered whether this was what she was experiencing. She tried to dress this with something she bought at the store however subsequently it pulled skin off and now she has an open wound that is not improving. She has been using Vaseline gauze with a cover bandage. She saw her primary doctor last week who KARYNNA, HAWKINS ([id number]) put an Unna boot on her. ABIs in this clinic was 1.03 on the left 2/12; the area is on the left medial ankle. Odd-looking wound with what looks to be surface epithelialization  but a multitude of small petechial openings. This clearly not closed yet. We have been using silver alginate under 3 layer compression with TCA 2/19; the wound area did not look quite as good this week. Necrotic debris over the majority of the wound surface which required debridement. She continues to have a multitude of what looked to be small petechial openings. She reminds [place] that she had a biopsy on this initially during her first outbreak in 2015 in [place] [facility]. She expresses concern about this being a possible melanoma. 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 I assured her that this does not look anything like melanoma. Except for the petechial reaction it does look like a venous insufficiency area and she certainly has evidence of this on both sides 2/26; a difficult area on the left medial ankle. The patient clearly has chronic venous hypertension with some degree of lymphedema. The odd thing about the area is  the small petechial hemorrhages. I am not really sure how to explain this. This was present last time and this is not a compression injury. We have been using Hydrofera Blue which I changed to last week 3/4; still using Hydrofera Blue. Aggressive debridement today. She does not have known arterial issues. She has seen Dr. Kellie Simmering at [facility] vein and vascular and and has an ablation on the left. [Anterior accessory branch of the greater saphenous]. From what I remember they did not feel she had an arterial issue. The patient has had this area biopsied in 2009 at [facility] dermatology and by her recollection they said this was "stasis". She is also follow-up with dermatology locally who thought that this was more of a vascular issue 3/11; using Hydrofera Blue. Aggressive debridement today. She does not have an arterial issue. We are using 3 layer compression although we may need to go to 4. The patient has been in for multiple changes to her wrap since I last saw her a week ago. She says that the area was leaking. I do not have too much more information on what was found 01/19/19 on evaluation today patient was actually being seen for a nurse visit when unfortunately she had the area on her left lateral lower extremity as well as weeping from the right lower extremity that became apparent. Therefore we did end up actually seeing her for a full visit with myself. She is having some pain at this site as well but fortunately nothing too significant at this point. No fevers, chills, nausea, or vomiting noted at this time. 3/18-Patient is back to the clinic with the left leg venous leg ulcer, the ulcer is larger in size, has a surface that is densely adherent with fibrinous tissue, the Hydrofera Blue was used but is densely adherent and there was difficulty in removing it. The right lower extremity was also wrapped for weeping edema. Patient has a new area over the left lateral foot above the malleolus  that is small and appears to have no debris with intact surrounding skin. Patient is on increased dose of Lasix also as a means to edema management 3/25; the patient has a nonhealing venous ulcer on the medial left leg and last week developed a smaller area on the lateral left calf. We have been using Hydrofera Blue with a contact layer. 4/1; no major change in these wounds areas. Left medial and more recently left lateral calf. I tried Iodoflex last week  to aid in debridement she did not tolerate this. She stated her pain was terrible all week. She took the top layer of the 4 layer compression off. 4/8; the patient actually looks somewhat better in terms of her more prominent left lateral calf wound. There is some healthy looking tissue here. She is still complaining of a lot of discomfort. 4/15; patient in a lot of pain secondary to sciatica. She is on a prednisone taper prescribed by her primary physician. She has the 2 areas one on the left medial and more recently a smaller area on the left lateral calf. Both of these just above the malleoli 4/22; her back pain is better but she still states she is very uncomfortable and now feels she is intolerant to the [employer]. No real change in the wounds we have been using Sorbact. She has been previously intolerant to Iodoflex. There is not a lot of option about what we can use to debride this wound under compression that she no doubt needs. sHe states Ultram no longer works for her pain 4/29; no major change in the wounds slightly increased depth. Surface on the original medial wound perhaps somewhat improved however the more recent area on the lateral left ankle is 100% covered in very adherent debris we have been using Sorbact. She tolerates 4 layer compression well and her edema control is a lot better. She has not had to come in for a nurse check 5/6; no major change in the condition of the wounds. She did consent to debridement today which was  done with some difficulty. Continuing Sorbact. She did not tolerate Iodoflex. She was in for a check of her compression the day after we wrapped her last week this was adjusted but nothing much was found 5/13; no major change in the condition or area of the wounds. I was able to get a fairly aggressive debridement done on the lateral left leg wound. Even using Sorbact under compression. She came back in on Friday to have the wrap changed. She says she felt uncomfortable on the lateral aspect of her ankle. She has a long history of chronic venous insufficiency including previous ablation surgery on this side. 5/20-Patient returns for wounds on left leg with both wounds covered in slough, with the lateral leg wound larger in size, she has been in 3 layer compression and felt more comfortable, she describes pain in ankle, in leg and pins and needles in foot, Cutillo, Nayely J. ([id number]) and is about to try Pamelor for this 6/3; wounds on the left lateral and left medial leg. The area medially which is the most recent of the 2 seems to have had the largest increase in dimensions. We have been using Sorbac to try and debride the surface. She has been to see orthopedics they apparently did a plain x-ray that was indeterminant. Diagnosed her with neuropathy and they have ordered an MRI to determine if there is underlying osteomyelitis. This was not high on my thought list but I suppose it is prudent. We have advised her to make an appointment with vein and vascular in [place]. She has a history of a left greater saphenous and accessory vein ablations I wonder if there is anything else that can be done from a surgical point of view to help in these difficult refractory wounds. We have previously healed this wound on one occasion but it keeps on reopening [medial side] 6/10; deep tissue culture I did last week I think on the  left medial wound showed both moderate E. coli and moderate staph aureus  [MSSA]. She is going to require antibiotics and I have chosen Augmentin. We have been using Sorbact and we have made better looking wound surface on both sides but certainly no improvement in wound area. She was back in last Friday apparently for a dressing changes the wrap was hurting her outer left ankle. She has not managed to get a hold of vein and vascular in [place]. We are going to have to make her that appointment 6/17; patient is tolerating the Augmentin. She had an MRI that I think was ordered by orthopedic surgeon this did not show osteomyelitis or an abscess did suggest cellulitis. We have been using Sorbact to the lateral and medial ankles. We have been trying to arrange a follow-up appointment with vein and vascular in [place] or did her original ablations. We apparently an area sent the request to vein and vascular in [facility] 6/24; patient has completed the Augmentin. We do not yet have a vein and vascular appointment in [place]. I am not sure what the issue is here we have asked her to call tomorrow. We are using Sorbact. Making some improvements and especially the medial wound. Both surfaces however look better medial and lateral. 7/1; the patient has been in contact with vein and vascular in [place] but has not yet received an appointment. Using Sorbact we have gradually improve the wound surface with no improvement in surface area. She is approved for Apligraf but the wound surface still is not completely viable. She has not had to come in for a dressing change 7/8; the patient has an appointment with vein and vascular on 7/31 which is a Friday afternoon. She is concerned about getting back here for [place] to dress her wounds. I think it is important to have them goal for her venous reflux/history of ablations etc. to see if anything else can be done. She apparently tested positive for 1 of the blood tests with regards to lupus and saw a rheumatologist. He has raised  the issue of vasculitis again. I have had this thought in the past however the evidence seems overwhelming that this is a venous reflux etiology. If the rheumatologist tells me there is clinical and laboratory investigation is positive for lupus I will rethink this. 7/15; the patient's wound surfaces are quite a bit better. The medial area which was her original wound now has no depth although the lateral wound which was the more recent area actually appears larger. Both with viable surfaces which is indeed better. Using Sorbact. I wanted to use Apligraf on her however there is the issue of the vein and vascular appointment on 7/31 at 2:00 in the afternoon which would not allow her to get back to be rewrapped and they would no doubt remove the graft 7/22; the patient's wound surfaces have moderate amount of debris although generally look better. The lateral one is larger with 2 small satellite areas superiorly. We are waiting for her vein and vascular appointment on 7/31. She has been approved for Apligraf which I would like to use after th 7/29; wound surfaces have improved no debridement is required we have been using Sorbact. She sees vein and vascular on Friday with this so question of whether anything can be done to lessen the likelihood of recurrence and/or speed the healing of these areas. She is already had previous ablations. She no doubt has severe venous hypertension 8/5-Patient returns at 1 week, she  was in [employer] for 3 days by her podiatrist, we have been using so backed to the wound, she has increased pain in both the wounds on the left lower leg especially the more distal one on the lateral aspect 8/12-Patient returns at 1 week and she is agreeable to having debridement in both wounds on her left leg today. We have been using Sorbact, and vascular studies were reviewed at last visit 8/19; the patient arrives with her wounds fairly clean and no debridement is required. We have used  Sorbact which is really done a nice job in cleaning up these very difficult wound surfaces. The patient saw Dr. Donzetta Matters of vascular surgery on 7/31. He did not feel that there was an arterial component. He felt that her treated greater saphenous vein is adequately addressed and that the small saphenous vein did not appear to be involved significantly. She was also noted to have deep venous reflux which is not treatable. Dr. Donzetta Matters mentioned the possibility of a central obstructive component leading to reflux and he offered her central venography. She wanted to discuss this or think about it. I have urged her to go ahead with this. She has had recurrent difficult wounds in these areas which do heal but after months in the clinic. If there is anything that can be done to reduce the likelihood of this I think it is worth it. 9/2 she is still working towards getting follow-up with Dr. Donzetta Matters to schedule her CT. Things are quite a bit worse venography. I put Apligraf on 2 weeks ago on both wounds on the medial and lateral part of her left lower leg. She arrives in clinic today with 3 superficial additional wounds above the area laterally and one below the wound medially. She describes a lot of discomfort. I think these are probably wrapped injuries. Does not look like she has cellulitis. 07/20/2019 on evaluation today patient appears to be doing somewhat poorly in regard to her lower extremity ulcers. She in fact showed signs of erythema in fact we may even be dealing with an infection at this time. Unfortunately I am unsure if this CHAELI, VILLAROSA ([id number]) is just infection or if indeed there may be some allergic reaction that occurred as a result of the Apligraf application. With that being said that would be unusual but nonetheless not impossible in this patient is one who is unfortunately allergic to quite a bit. Currently we have been using the Sorbact which seems to do as well as anything for her. I do  think we may want to obtain a culture today to see if there is anything showing up there that may need to be addressed. 9/16; noted that last week the wounds look worse in 1 week follow-up of the Apligraf. Using Sorbact as of 2 days ago. She arrives with copious amounts of drainage and new skin breakdown on the back of the left calf. The wounds arm more substantial bilaterally. There is a fair amount of swelling in the left calf no overt DVT there is edema present I think in the left greater than right thigh. She is supposed to go on 9/28 for CT venography. The wounds on the medial and lateral calf are worse and she has new skin breakdown posteriorly at least new for me. This is almost developing into a circumferential wound area The Apligraf was taken off last week which I agree with things are not going in the right direction a culture was done we  do not have that back yet. She is on Augmentin that she started 2 days ago Electronic Signature(s) Signed: 07/22/2019 5:11:23 PM By: Linton Ham MD Entered By: Linton Ham on 07/22/2019 13:41:06 Haver, [name] ([id number]) -------------------------------------------------------------------------------- Physical Exam Details Patient Name: KINSLEY, LEARNED. Date of Service: 07/22/2019 12:45 PM Medical Record Number: [id number] Patient Account Number: [id number] Date of Birth/Sex: 06-Nov-1945 (73 y.o. F) Treating RN: Cornell Barman Primary Care Provider: [name] Other Clinician: Referring Provider: [name] Treating Provider/Extender: Tito Dine in Treatment: 32 Notes Wound exam; things are not going well. The 2 original wounds on the left medial and left lateral calf are eschared covered but I did not attempt debridement. There is skin breakdown in a large area around these wounds which is extended into the posterior part of her calf. Nothing looks obviously infected here although I do not disagree with the  antibiotics. There is definitely edema in the left leg extending into the left thigh Electronic Signature(s) Signed: 07/22/2019 5:11:23 PM By: Linton Ham MD Entered By: Linton Ham on 07/22/2019 14:04:38 Freeman, [name] ([id number]) -------------------------------------------------------------------------------- Physician Orders Details Patient Name: LAREINE, AUGUSTIN. Date of Service: 07/22/2019 12:45 PM Medical Record Number: [id number] Patient Account Number: [id number] Date of Birth/Sex: 09/13/1946 (73 y.o. F) Treating RN: Harold Barban Primary Care Provider: [name] Other Clinician: Referring Provider: [name] Treating Provider/Extender: Tito Dine in Treatment: 72 Verbal / Phone Orders: No Diagnosis Coding Wound Cleansing Wound #5 Left,Medial Lower Leg o Clean wound with Normal Saline. o May Shower, gently pat wound dry prior to applying new dressing. Wound #6 Left,Distal,Lateral Lower Leg o Clean wound with Normal Saline. o May Shower, gently pat wound dry prior to applying new dressing. Wound #7 Left,Proximal,Lateral Lower Leg o Clean wound with Normal Saline. o May Shower, gently pat wound dry prior to applying new dressing. Wound #8 Left,Lateral Lower Leg o Clean wound with Normal Saline. o May Shower, gently pat wound dry prior to applying new dressing. Wound #9 Left,Lateral,Posterior Lower Leg o Clean wound with Normal Saline. o May Shower, gently pat wound dry prior to applying new dressing. Anesthetic (add to Medication List) Wound #5 Left,Medial Lower Leg o Topical Lidocaine 4% cream applied to wound bed prior to debridement (In Clinic Only). o Benzocaine Topical Anesthetic Spray applied to wound bed prior to debridement (In Clinic Only). Skin Barriers/Peri-Wound Care Wound #5 Left,Medial Lower Leg o Barrier cream - Zinc oxide to periwound area Wound #6 Left,Distal,Lateral Lower Leg o Barrier  cream - Zinc oxide to periwound area Wound #7 Left,Proximal,Lateral Lower Leg o Barrier cream - Zinc oxide to periwound area Wound #8 Left,Lateral Lower Leg o Barrier cream - Zinc oxide to periwound area Wound #9 Left,Lateral,Posterior Lower Leg o Barrier cream - Zinc oxide to periwound area Primary Wound Dressing Wound #5 Left,Medial Lower Leg ZEANNA, ANTELL ([id number]) o Silver Alginate Wound #6 Left,Distal,Lateral Lower Leg o Silver Alginate Wound #7 Left,Proximal,Lateral Lower Leg o Silver Alginate Wound #8 Left,Lateral Lower Leg o Silver Alginate Wound #9 Left,Lateral,Posterior Lower Leg o Silver Alginate Secondary Dressing Wound #5 Left,Medial Lower Leg o ABD pad o Drawtex Wound #6 Left,Distal,Lateral Lower Leg o ABD pad o Drawtex Wound #7 Left,Proximal,Lateral Lower Leg o ABD pad o Drawtex Wound #8 Left,Lateral Lower Leg o ABD pad o Drawtex Wound #9 Left,Lateral,Posterior Lower Leg o ABD pad o Drawtex Follow-up Appointments Wound #5 Left,Medial Lower Leg o Return Appointment in 1 week. o Nurse Visit as needed - Monday,  September 21st Wound #6 Left,Distal,Lateral Lower Leg o Return Appointment in 1 week. o Nurse Visit as needed - Monday, September 21st Wound #7 Left,Proximal,Lateral Lower Leg o Return Appointment in 1 week. o Nurse Visit as needed - Monday, September 21st Wound #8 Left,Lateral Lower Leg o Return Appointment in 1 week. o Nurse Visit as needed - Monday, September 21st Wound #9 Left,Lateral,Posterior Lower Leg o Return Appointment in 1 week. o Nurse Visit as needed - Monday, September 21st CEYLIN, STIEGLITZ ([id number]) Edema Control Wound #5 Left,Medial Lower Leg o 3 Layer Compression System - Left Lower Extremity Wound #6 Left,Distal,Lateral Lower Leg o 3 Layer Compression System - Left Lower Extremity Wound #7 Left,Proximal,Lateral Lower Leg o 3 Layer Compression System -  Left Lower Extremity Wound #8 Left,Lateral Lower Leg o 3 Layer Compression System - Left Lower Extremity Wound #9 Left,Lateral,Posterior Lower Leg o 3 Layer Compression System - Left Lower Extremity Off-Loading Wound #5 Left,Medial Lower Leg o Other: - Elevate legs as needed Wound #6 Left,Distal,Lateral Lower Leg o Other: - Elevate legs as needed Wound #7 Left,Proximal,Lateral Lower Leg o Other: - Elevate legs as needed Wound #8 Left,Lateral Lower Leg o Other: - Elevate legs as needed Wound #9 Left,Lateral,Posterior Lower Leg o Other: - Elevate legs as needed Electronic Signature(s) Signed: 07/22/2019 4:25:56 PM By: Harold Barban Signed: 07/22/2019 5:11:23 PM By: Linton Ham MD Entered By: Harold Barban on 07/22/2019 13:38:18 Kutzer, [name] -------------------------------------------------------------------------------- Problem List Details Patient Name: CARAL, JOHNSON. Date of Service: 07/22/2019 12:45 PM Medical Record Number: [id number] Patient Account Number: [id number] Date of Birth/Sex: [birth year] (73 y.o. F) Treating RN: Cornell Barman Primary Care Provider: [name] Other Clinician: Referring Provider: [name] Treating Provider/Extender: Tito Dine in Treatment: 32 Active Problems ICD-10 Evaluated Encounter Code Description Active Date Today Diagnosis L97.221 Non-pressure chronic ulcer of left calf limited to breakdown of 01/07/2019 No Yes skin I87.321 Chronic venous hypertension (idiopathic) with inflammation of 12/10/2018 No Yes right lower extremity I89.0 Lymphedema, not elsewhere classified 12/10/2018 No Yes Inactive Problems Resolved Problems Electronic Signature(s) Signed: 07/22/2019 5:11:23 PM By: Linton Ham MD Entered By: Linton Ham on 07/22/2019 13:37:28 Mantey, [name] -------------------------------------------------------------------------------- Progress Note  Details Patient Name: SHANELE, BELONGIA. Date of Service: 07/22/2019 12:45 PM Medical Record Number: [id number] Patient Account Number: [id number] Date of Birth/Sex: 08-24-1946 (73 y.o. F) Treating RN: Cornell Barman Primary Care Provider: [name] Other Clinician: Referring Provider: [name] Treating Provider/Extender: Tito Dine in Treatment: 32 Subjective History of Present Illness (HPI) Pleasant 73 year old with history of chronic venous insufficiency. No diabetes or peripheral vascular disease. Left ABI 1.29. Questionable history of left lower extremity DVT. She developed a recurrent ulceration on her left lateral calf in December 2015, which she attributes to poor diet and subsequent lower extremity edema. She underwent endovenous laser ablation of her left greater saphenous vein in 2010. She underwent laser ablation of accessory branch of left GSV in April 2016 by Dr. Kellie Simmering at [facility]. She was previously wearing Unna boots, which she tolerated well. Tolerating 2 layer compression and cadexomer iodine. She returns to clinic for follow-up and is without new complaints. She denies any significant pain at this time. She reports persistent pain with pressure. No claudication or ischemic rest pain. No fever or chills. No drainage. READMISSION 11/13/16; this is a 73 year old woman who is not a diabetic. She is here for a review of a painful area on her left medial lower extremity. I note  that she was seen here previously last year for wound I believe to be in the same area. At that time she had undergone previously a left greater saphenous vein ablation by Dr. Kellie Simmering and she had a ablation of the anterior accessory branch of the left greater saphenous vein in March 2016. Seeing that the wound actually closed over. In reviewing the history with her today the ulcer in this area has been recurrent. She describes a biopsy of this area in 2009 that only showed  stasis physiology. 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 Do not see that she has ever had arterial studies in the left leg. She tells me she has a follow-up with Dr. Kellie Simmering in roughly 10 days In any case she developed the reopening of this area roughly a month ago. On the background of this she describes rapidly increasing edema which has responded to Lasix 40 mg and metolazone 2.5 mg as well as the patient's lymph massage. She has been told she has both venous insufficiency and lymphedema but she cannot tolerate compression stockings 11/28/16; the patient saw Dr. Kellie Simmering recently. Per the patient he did arterial Dopplers in the office that did not show evidence of arterial insufficiency, per the patient he stated "treat this like an ordinary venous ulcer". She also saw her dermatologist Dr. Ronnald Ramp who felt that this was more of a vascular ulcer. In general things are improving although she arrives today with increasing bilateral lower extremity edema with weeping a deeper fluid through the wound on the left medial leg compatible with some degree of lymphedema 12/04/16; the patient's wound is fully epithelialized but I don't think fully healed. We will do another week of depression with Promogran and TCA however I suspect we'll be able to discharge her next week. This is a very unusual-looking wound which was initially a figure-of-eight type wound lying on its side surrounded by petechial like hemorrhage. She has had venous ablation on this side. She apparently does not have an arterial issue per Dr. Kellie Simmering. She saw her dermatologist thought it was "vascular". Patient is definitely going to need ongoing compression and I talked about this with her today she will go to elastic  therapy after she leaves here next week 12/11/16; the patient's wound is not completely closed today. She has surrounding scar tissue and in further discussion with the patient it would appear that she had ulcers in this area in 2009 for a prolonged period of time ultimately requiring a punch biopsy of this area that only showed venous insufficiency. I did not previously pickup on this part of the history from the patient. 12/18/16; the patient's wound is completely epithelialized. There is no open area here. She has significant bilateral venous insufficiency with secondary lymphedema to a mild-to-moderate degree she does not have compression stockings.. She did not say anything to me when I was in the room, she told our intake nurse that she was still having pain in this area. This isn't unusual recurrent small open area. She is going to go to elastic therapy to obtain compression stockings. 12/25/16; the patient's wound is fully epithelialized. There is no open area here. The patient describes some continued episodic discomfort in this area medial left calf. However everything looks fine and  healed here. She is been to elastic therapy and AUDRY, SPREEN ([id number]) caught herself 15-20 mmHg stockings, they apparently were having trouble getting 20-30 mm stockings in her size 01/22/17; this is a patient we discharged from the clinic a month ago. She has a recurrent open wound on her medial left calf. She had 15 mm support stockings. I told her I thought she needed 20-30 mm compression stockings. She tells me that she has been ill with hospitalization secondary to asthma and is been found to have severe hypokalemia likely secondary to a combination of Lasix and metolazone. This morning she noted blistering and leaking fluid on the posterior part of her left leg. She called our intake nurse urgently and we was saw her this afternoon. She has not had any real discomfort here. I don't know that she's  been wearing any stockings on this leg for at least 2-3 days. ABIs in this clinic were 1.21 on the right and 1.3 on the left. She is previously seen vascular surgery who does not think that there is a peripheral arterial issue. 01/30/17; Patient arrives with no open wound on the left leg. She has been to elastic therapy and obtained 20-110mmhg below knee stockings and she has one on the right leg today. READMISSION 02/19/18; this Giel is a now 73 year old patient we've had in this clinic perhaps 3 times before. I had last looked at her from January 07 December 2016 with an area on the medial left leg. We discharged her on 12/25/16 however she had to be readmitted on 01/22/17 with a recurrence. I have in my notes that we discharged her on 20-30 mm stockings although she tells me she was only wearing support hose because she cannot get stockings on predominantly related to her cervical spine surgery/issues. She has had previous ablations done by vein and vascular in [place] including a great saphenous vein ablation on the left with an anterior accessory branch ablation I think both of these were in 2016. On one of the previous visit she had a biopsy noted 2009 that was negative. She is not felt to have an arterial issue. She is not a diabetic. She does have a history of obstructive sleep apnea hypertension asthma as well as chronic venous insufficiency and lymphedema. On this occasion she noted 2 dry scaly patch on her left leg. She tried to put lotion on this it didn't really help. There were 2 open areas.the patient has been seeing her primary physician from 02/05/18 through 02/14/18. She had Unna boots applied. The superior wound now on the lateral left leg has closed but she's had one wound that remains open on the lateral left leg. This is not the same spot as we dealt with in 2018. ABIs in this clinic were 1.3 bilaterally 02/26/18; patient has a small wound on the left lateral calf. Dimensions are  down. She has chronic venous insufficiency and lymphedema. 03/05/18; small open area on the left lateral calf. Dimensions are down. Tightly adherent necrotic debris over the surface of the wound which was difficult to remove. Also the dressing [over collagen] stuck to the wound surface. This was removed with some difficulty as well. Change the primary dressing to Hydrofera Blue ready 03/12/18; small open area on the left lateral calf. Comes in with tightly adherent surface eschar as well as some adherent Hydrofera Blue. 03/19/18; open area on the left lateral calf. Again adherent surface eschar as well as some adherent Hydrofera Blue nonviable subcutaneous tissue. She  complained of pain all week even with the reduction from 4-3 layer compression I put on last week. Also she had an increase in her ankle and calf measurements probably related to the same thing. 03/26/18; open area on the left lateral calf. A very small open area remains here. We used silver alginate starting last week as the Hydrofera Blue seem to stick to the wound bed. In using 4-layer compression 04/02/18; the open area in the left lateral calf at some adherent slough which I removed there is no open area here. We are able to transition her into her own compression stocking. Truthfully I think this is probably his support hose. However this does not maintain skin integrity will be limited. She cannot put over the toe compression stockings on because of neck problems hand problems etc. She is allergic to the lining layer of juxta lites. We might be forced to use extremitease stocking should this fail READMIT 11/24/2018 Patient is now a 73 year old woman who is not a diabetic. She has been in this clinic on at least 3 previous occasions largely with recurrent wounds on her left leg secondary to chronic venous insufficiency with secondary lymphedema. Her situation is complicated by inability to get stockings on and an allergy to neoprene  which is apparently a component and at least juxta lites and other stockings. As a result she really has not been wearing any stockings on her legs. She tells [place] that roughly 2 or 3 weeks ago she started noticing a stinging sensation just above her ankle on the left medial aspect. She has been diagnosed with pseudogout and she wondered whether this was what she was experiencing. She tried to dress this with something she bought at the store however subsequently it pulled skin off and now she has an open wound that is not improving. She has been using Vaseline gauze with a cover bandage. She saw her primary doctor last week who put an Haematologist on her. ABIs in this clinic was 1.03 on the left RYIAH, DEPAS. ([id number]) 2/12; the area is on the left medial ankle. Odd-looking wound with what looks to be surface epithelialization but a multitude of small petechial openings. This clearly not closed yet. We have been using silver alginate under 3 layer compression with TCA 2/19; the wound area did not look quite as good this week. Necrotic debris over the majority of the wound surface which required debridement. She continues to have a multitude of what looked to be small petechial openings. She reminds [place] that she had a biopsy on this initially during her first outbreak in 2015 in [place] [facility]. She expresses concern about this being a possible melanoma. 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 I assured her that this does not look anything like melanoma. Except for the petechial reaction it does look like a venous insufficiency area and she certainly has evidence of this on both sides 2/26; a difficult area on the left medial ankle. The patient clearly has chronic venous hypertension with some degree of lymphedema. The odd thing about the area is the small petechial hemorrhages. I am not really sure  how to explain this. This was present last time and this is not a compression injury. We have been using Hydrofera Blue which I changed to last week 3/4; still using Hydrofera Blue. Aggressive debridement today. She does not have known arterial issues. She has seen  Dr. Kellie Simmering at [facility] vein [facility] vascular and and has an ablation on the left. [Anterior accessory branch of the greater saphenous]. From what I remember they did not feel she had an arterial issue. The patient has had this area biopsied in 2009 at [facility] dermatology and by her recollection they said this was "stasis". She is also follow-up with dermatology locally who thought that this was more of a vascular issue 3/11; using Hydrofera Blue. Aggressive debridement today. She does not have an arterial issue. We are using 3 layer compression although we may need to go to 4. The patient has been in for multiple changes to her wrap since I last saw her a week ago. She says that the area was leaking. I do not have too much more information on what was found 01/19/19 on evaluation today patient was actually being seen for a nurse visit when unfortunately she had the area on her left lateral lower extremity as well as weeping from the right lower extremity that became apparent. Therefore we did end up actually seeing her for a full visit with myself. She is having some pain at this site as well but fortunately nothing too significant at this point. No fevers, chills, nausea, or vomiting noted at this time. 3/18-Patient is back to the clinic with the left leg venous leg ulcer, the ulcer is larger in size, has a surface that is densely adherent with fibrinous tissue, the Hydrofera Blue was used but is densely adherent and there was difficulty in removing it. The right lower extremity was also wrapped for weeping edema. Patient has a new area over the left lateral foot above the malleolus that is small and appears to have no debris with intact  surrounding skin. Patient is on increased dose of Lasix also as a means to edema management 3/25; the patient has a nonhealing venous ulcer on the medial left leg and last week developed a smaller area on the lateral left calf. We have been using Hydrofera Blue with a contact layer. 4/1; no major change in these wounds areas. Left medial and more recently left lateral calf. I tried Iodoflex last week to aid in debridement she did not tolerate this. She stated her pain was terrible all week. She took the top layer of the 4 layer compression off. 4/8; the patient actually looks somewhat better in terms of her more prominent left lateral calf wound. There is some healthy looking tissue here. She is still complaining of a lot of discomfort. 4/15; patient in a lot of pain secondary to sciatica. She is on a prednisone taper prescribed by her primary physician. She has the 2 areas one on the left medial and more recently a smaller area on the left lateral calf. Both of these just above the malleoli 4/22; her back pain is better but she still states she is very uncomfortable and now feels she is intolerant to the [employer]. No real change in the wounds we have been using Sorbact. She has been previously intolerant to Iodoflex. There is not a lot of option about what we can use to debride this wound under compression that she no doubt needs. sHe states Ultram no longer works for her pain 4/29; no major change in the wounds slightly increased depth. Surface on the original medial wound perhaps somewhat improved however the more recent area on the lateral left ankle is 100% covered in very adherent debris we have been using Sorbact. She tolerates 4 layer compression  well and her edema control is a lot better. She has not had to come in for a nurse check 5/6; no major change in the condition of the wounds. She did consent to debridement today which was done with some difficulty. Continuing Sorbact. She did  not tolerate Iodoflex. She was in for a check of her compression the day after we wrapped her last week this was adjusted but nothing much was found 5/13; no major change in the condition or area of the wounds. I was able to get a fairly aggressive debridement done on the lateral left leg wound. Even using Sorbact under compression. She came back in on Friday to have the wrap changed. She says she felt uncomfortable on the lateral aspect of her ankle. She has a long history of chronic venous insufficiency including previous ablation surgery on this side. 5/20-Patient returns for wounds on left leg with both wounds covered in slough, with the lateral leg wound larger in size, she has been in 3 layer compression and felt more comfortable, she describes pain in ankle, in leg and pins and needles in foot, and is about to try Pamelor for this 6/3; wounds on the left lateral and left medial leg. The area medially which is the most recent of the 2 seems to have had the largest increase in dimensions. We have been using Sorbac to try and debride the surface. She has been to see orthopedics ELIORA, SVEEN ([id number]) they apparently did a plain x-ray that was indeterminant. Diagnosed her with neuropathy and they have ordered an MRI to determine if there is underlying osteomyelitis. This was not high on my thought list but I suppose it is prudent. We have advised her to make an appointment with vein and vascular in [place]. She has a history of a left greater saphenous and accessory vein ablations I wonder if there is anything else that can be done from a surgical point of view to help in these difficult refractory wounds. We have previously healed this wound on one occasion but it keeps on reopening [medial side] 6/10; deep tissue culture I did last week I think on the left medial wound showed both moderate E. coli and moderate staph aureus [MSSA]. She is going to require antibiotics and I have chosen  Augmentin. We have been using Sorbact and we have made better looking wound surface on both sides but certainly no improvement in wound area. She was back in last Friday apparently for a dressing changes the wrap was hurting her outer left ankle. She has not managed to get a hold of vein and vascular in [place]. We are going to have to make her that appointment 6/17; patient is tolerating the Augmentin. She had an MRI that I think was ordered by orthopedic surgeon this did not show osteomyelitis or an abscess did suggest cellulitis. We have been using Sorbact to the lateral and medial ankles. We have been trying to arrange a follow-up appointment with vein and vascular in [place] or did her original ablations. We apparently an area sent the request to vein and vascular in [facility] 6/24; patient has completed the Augmentin. We do not yet have a vein and vascular appointment in [place]. I am not sure what the issue is here we have asked her to call tomorrow. We are using Sorbact. Making some improvements and especially the medial wound. Both surfaces however look better medial and lateral. 7/1; the patient has been in contact with vein and vascular in  [place] but has not yet received an appointment. Using Sorbact we have gradually improve the wound surface with no improvement in surface area. She is approved for Apligraf but the wound surface still is not completely viable. She has not had to come in for a dressing change 7/8; the patient has an appointment with vein and vascular on 7/31 which is a Friday afternoon. She is concerned about getting back here for [place] to dress her wounds. I think it is important to have them goal for her venous reflux/history of ablations etc. to see if anything else can be done. She apparently tested positive for 1 of the blood tests with regards to lupus and saw a rheumatologist. He has raised the issue of vasculitis again. I have had this thought in the  past however the evidence seems overwhelming that this is a venous reflux etiology. If the rheumatologist tells me there is clinical and laboratory investigation is positive for lupus I will rethink this. 7/15; the patient's wound surfaces are quite a bit better. The medial area which was her original wound now has no depth although the lateral wound which was the more recent area actually appears larger. Both with viable surfaces which is indeed better. Using Sorbact. I wanted to use Apligraf on her however there is the issue of the vein and vascular appointment on 7/31 at 2:00 in the afternoon which would not allow her to get back to be rewrapped and they would no doubt remove the graft 7/22; the patient's wound surfaces have moderate amount of debris although generally look better. The lateral one is larger with 2 small satellite areas superiorly. We are waiting for her vein and vascular appointment on 7/31. She has been approved for Apligraf which I would like to use after th 7/29; wound surfaces have improved no debridement is required we have been using Sorbact. She sees vein and vascular on Friday with this so question of whether anything can be done to lessen the likelihood of recurrence and/or speed the healing of these areas. She is already had previous ablations. She no doubt has severe venous hypertension 8/5-Patient returns at 1 week, she was in [place] for 3 days by her podiatrist, we have been using so backed to the wound, she has increased pain in both the wounds on the left lower leg especially the more distal one on the lateral aspect 8/12-Patient returns at 1 week and she is agreeable to having debridement in both wounds on her left leg today. We have been using Sorbact, and vascular studies were reviewed at last visit 8/19; the patient arrives with her wounds fairly clean and no debridement is required. We have used Sorbact which is really done a nice job in cleaning up these  very difficult wound surfaces. The patient saw Dr. Donzetta Matters of vascular surgery on 7/31. He did not feel that there was an arterial component. He felt that her treated greater saphenous vein is adequately addressed and that the small saphenous vein did not appear to be involved significantly. She was also noted to have deep venous reflux which is not treatable. Dr. Donzetta Matters mentioned the possibility of a central obstructive component leading to reflux and he offered her central venography. She wanted to discuss this or think about it. I have urged her to go ahead with this. She has had recurrent difficult wounds in these areas which do heal but after months in the clinic. If there is anything that can be done to reduce  the likelihood of this I think it is worth it. 9/2 she is still working towards getting follow-up with Dr. Donzetta Matters to schedule her CT. Things are quite a bit worse venography. I put Apligraf on 2 weeks ago on both wounds on the medial and lateral part of her left lower leg. She arrives in clinic today with 3 superficial additional wounds above the area laterally and one below the wound medially. She describes a lot of discomfort. I think these are probably wrapped injuries. Does not look like she has cellulitis. 07/20/2019 on evaluation today patient appears to be doing somewhat poorly in regard to her lower extremity ulcers. She in fact showed signs of erythema in fact we may even be dealing with an infection at this time. Unfortunately I am unsure if this is just infection or if indeed there may be some allergic reaction that occurred as a result of the Apligraf application. With that being said that would be unusual but nonetheless not impossible in this patient is one who is unfortunately allergic to quite a bit. Currently we have been using the Sorbact which seems to do as well as anything for her. I do think we may want to obtain JENNIS, THORNBERG ([id number]) a culture today to see if there  is anything showing up there that may need to be addressed. 9/16; noted that last week the wounds look worse in 1 week follow-up of the Apligraf. Using Sorbact as of 2 days ago. She arrives with copious amounts of drainage and new skin breakdown on the back of the left calf. The wounds arm more substantial bilaterally. There is a fair amount of swelling in the left calf no overt DVT there is edema present I think in the left greater than right thigh. She is supposed to go on 9/28 for CT venography. The wounds on the medial and lateral calf are worse and she has new skin breakdown posteriorly at least new for me. This is almost developing into a circumferential wound area The Apligraf was taken off last week which I agree with things are not going in the right direction a culture was done we do not have that back yet. She is on Augmentin that she started 2 days ago Objective Constitutional Vitals Time Taken: 12:50 PM, Height: 63 in, Weight: 224.7 lbs, BMI: 39.8, Temperature: 99.0 F, Pulse: 89 bpm, Respiratory Rate: 16 breaths/min, Blood Pressure: 150/80 mmHg. Integumentary (Hair, Skin) Wound #5 status is Open. Original cause of wound was Gradually Appeared. The wound is located on the Left,Medial Lower Leg. The wound measures 6cm length x 7cm width x 0.1cm depth; 32.987cm^2 area and 3.299cm^3 volume. There is Fat Layer (Subcutaneous Tissue) Exposed exposed. There is no tunneling or undermining noted. There is a medium amount of serosanguineous drainage noted. There is medium (34-66%) red granulation within the wound bed. There is a medium (34- 66%) amount of necrotic tissue within the wound bed including Adherent Slough. Wound #6 status is Open. Original cause of wound was Gradually Appeared. The wound is located on the Left,Distal,Lateral Lower Leg. The wound measures 4cm length x 4.2cm width x 0.1cm depth; 13.195cm^2 area and 1.319cm^3 volume. There is Fat Layer (Subcutaneous Tissue) Exposed  exposed. There is no tunneling or undermining noted. There is a large amount of serosanguineous drainage noted. There is medium (34-66%) red granulation within the wound bed. There is a medium (34- 66%) amount of necrotic tissue within the wound bed including Adherent Slough. Wound #7 status is Open.  Original cause of wound was Gradually Appeared. The wound is located on the Left,Proximal,Lateral Lower Leg. The wound measures 4cm length x 4.5cm width x 0.1cm depth; 14.137cm^2 area and 1.414cm^3 volume. There is Fat Layer (Subcutaneous Tissue) Exposed exposed. There is no tunneling or undermining noted. There is a medium amount of serosanguineous drainage noted. There is medium (34-66%) red granulation within the wound bed. There is a medium (34-66%) amount of necrotic tissue within the wound bed including Adherent Slough. Wound #8 status is Open. Original cause of wound was Gradually Appeared. The wound is located on the Left,Lateral Lower Leg. The wound measures 4.8cm length x 4cm width x 0.1cm depth; 15.08cm^2 area and 1.508cm^3 volume. There is Fat Layer (Subcutaneous Tissue) Exposed exposed. There is no tunneling or undermining noted. There is a medium amount of serosanguineous drainage noted. There is medium (34-66%) red granulation within the wound bed. There is a medium (34- 66%) amount of necrotic tissue within the wound bed including Adherent Slough. Wound #9 status is Open. Original cause of wound was Gradually Appeared. The wound is located on the Left,Lateral,Posterior Lower Leg. The wound measures 4.5cm length x 4cm width x 0.1cm depth; 14.137cm^2 area and 1.414cm^3 volume. There is Fat Layer (Subcutaneous Tissue) Exposed exposed. There is no tunneling or undermining noted. There is a medium amount of serosanguineous drainage noted. There is medium (34-66%) red granulation within the wound bed. There is a medium (34-66%) amount of necrotic tissue within the wound bed including Adherent  Slough. AUBRIANA, SARAH ([id number]) Assessment Active Problems ICD-10 Non-pressure chronic ulcer of left calf limited to breakdown of skin Chronic venous hypertension (idiopathic) with inflammation of right lower extremity Lymphedema, not elsewhere classified Plan Wound Cleansing: Wound #5 Left,Medial Lower Leg: Clean wound with Normal Saline. May Shower, gently pat wound dry prior to applying new dressing. Wound #6 Left,Distal,Lateral Lower Leg: Clean wound with Normal Saline. May Shower, gently pat wound dry prior to applying new dressing. Wound #7 Left,Proximal,Lateral Lower Leg: Clean wound with Normal Saline. May Shower, gently pat wound dry prior to applying new dressing. Wound #8 Left,Lateral Lower Leg: Clean wound with Normal Saline. May Shower, gently pat wound dry prior to applying new dressing. Wound #9 Left,Lateral,Posterior Lower Leg: Clean wound with Normal Saline. May Shower, gently pat wound dry prior to applying new dressing. Anesthetic (add to Medication List): Wound #5 Left,Medial Lower Leg: Topical Lidocaine 4% cream applied to wound bed prior to debridement (In Clinic Only). Benzocaine Topical Anesthetic Spray applied to wound bed prior to debridement (In Clinic Only). Skin Barriers/Peri-Wound Care: Wound #5 Left,Medial Lower Leg: Barrier cream - Zinc oxide to periwound area Wound #6 Left,Distal,Lateral Lower Leg: Barrier cream - Zinc oxide to periwound area Wound #7 Left,Proximal,Lateral Lower Leg: Barrier cream - Zinc oxide to periwound area Wound #8 Left,Lateral Lower Leg: Barrier cream - Zinc oxide to periwound area Wound #9 Left,Lateral,Posterior Lower Leg: Barrier cream - Zinc oxide to periwound area Primary Wound Dressing: Wound #5 Left,Medial Lower Leg: Silver Alginate Wound #6 Left,Distal,Lateral Lower Leg: Silver Alginate Wound #7 Left,Proximal,Lateral Lower Leg: Silver Alginate Wound #8 Left,Lateral Lower Leg: Silver Alginate VALLA, SUI ([id number]) Wound #9 Left,Lateral,Posterior Lower Leg: Silver Alginate Secondary Dressing: Wound #5 Left,Medial Lower Leg: ABD pad Drawtex Wound #6 Left,Distal,Lateral Lower Leg: ABD pad Drawtex Wound #7 Left,Proximal,Lateral Lower Leg: ABD pad Drawtex Wound #8 Left,Lateral Lower Leg: ABD pad Drawtex Wound #9 Left,Lateral,Posterior Lower Leg: ABD pad Drawtex Follow-up Appointments: Wound #5 Left,Medial Lower Leg: Return Appointment in  1 week. Nurse Visit as needed - Monday, September 21st Wound #6 Left,Distal,Lateral Lower Leg: Return Appointment in 1 week. Nurse Visit as needed - Monday, September 21st Wound #7 Left,Proximal,Lateral Lower Leg: Return Appointment in 1 week. Nurse Visit as needed - Monday, September 21st Wound #8 Left,Lateral Lower Leg: Return Appointment in 1 week. Nurse Visit as needed - Monday, September 21st Wound #9 Left,Lateral,Posterior Lower Leg: Return Appointment in 1 week. Nurse Visit as needed - Monday, September 21st Edema Control: Wound #5 Left,Medial Lower Leg: 3 Layer Compression System - Left Lower Extremity Wound #6 Left,Distal,Lateral Lower Leg: 3 Layer Compression System - Left Lower Extremity Wound #7 Left,Proximal,Lateral Lower Leg: 3 Layer Compression System - Left Lower Extremity Wound #8 Left,Lateral Lower Leg: 3 Layer Compression System - Left Lower Extremity Wound #9 Left,Lateral,Posterior Lower Leg: 3 Layer Compression System - Left Lower Extremity Off-Loading: Wound #5 Left,Medial Lower Leg: Other: - Elevate legs as needed Wound #6 Left,Distal,Lateral Lower Leg: Other: - Elevate legs as needed Wound #7 Left,Proximal,Lateral Lower Leg: Other: - Elevate legs as needed Wound #8 Left,Lateral Lower Leg: Other: - Elevate legs as needed Wound #9 Left,Lateral,Posterior Lower Leg: Other: - Elevate legs as needed Hogston, Gabriellia J. ([id number]) 1. Waiting for the CT venogram on 9/28. 2. I change the primary dressing  to silver alginate drawtex ABDs under 3 layer compression. There is a lot of drainage in these wound areas and I wonder if this has something to do with the deterioration. 3. Continue Augmentin, all wait to see the results of the culture Electronic Signature(s) Signed: 07/22/2019 5:11:23 PM By: Linton Ham MD Entered By: Linton Ham on 07/22/2019 14:05:52 Garber, [name] -------------------------------------------------------------------------------- SuperBill Details Patient Name: JANNENE, ZELAZNY. Date of Service: 07/22/2019 Medical Record Number: [id number] Patient Account Number: [id number] Date of Birth/Sex: September 27, 1946 (73 y.o. F) Treating RN: Cornell Barman Primary Care Provider: [name] Other Clinician: Referring Provider: [name] Treating Provider/Extender: Tito Dine in Treatment: 32 Diagnosis Coding ICD-10 Codes Code Description [phone] Non-pressure chronic ulcer of left calf limited to breakdown of skin I87.321 Chronic venous hypertension (idiopathic) with inflammation of right lower extremity I89.0 Lymphedema, not elsewhere classified Facility Procedures CPT4 Code: [id number] Description: [id number] - WOUND CARE VISIT-LEV 5 EST PT Modifier: Quantity: 1 Physician Procedures CPT4 Code Description: [id number] - WC PHYS LEVEL 3 - EST PT ICD-10 Diagnosis Description L97.221 Non-pressure chronic ulcer of left calf limited to breakdown I87.321 Chronic venous hypertension (idiopathic) with inflammation of I89.0 Lymphedema, not  elsewhere classified Modifier: of skin right lower extr Quantity: 1 emity Electronic Signature(s) Signed: 07/22/2019 5:20:41 PM By: [name], [name], RN, CWS, [name], [name] Previous Signature: 07/22/2019 5:11:23 PM Version By: Linton Ham MD Entered By: [name], [name], RN, CWS, Kim on 07/22/2019 17:20:40

## 2019-07-22 NOTE — Progress Notes (Signed)
Amber, [name] ([id number]) Visit Report for 07/22/2019 Arrival Information Details Patient Name: [name], [name]. Date of Service: 07/22/2019 12:45 PM Medical Record Number: [id number] Patient Account Number: [id number] Date of Birth/Sex: [birth year] (73 y.o. F) Treating RN: Cornell Barman Primary Care June Vacha: [name] Other Clinician: Referring Braysen Cloward: [name] Treating Ronnell Makarewicz/Extender: Tito Dine in Treatment: 39 Visit Information History Since Last Visit Added or deleted any medications: No Patient Arrived: Ambulatory Any new allergies or adverse reactions: No Arrival Time: 12:51 Had a fall or experienced change in No Accompanied By: self activities of daily living that may affect Transfer Assistance: None risk of falls: Patient Identification Verified: Yes Signs or symptoms of abuse/neglect since last visito No Secondary Verification Process Yes Hospitalized since last visit: No Completed: Implantable device outside of the clinic excluding No Patient Requires Transmission-Based No cellular tissue based products placed in the center Precautions: since last visit: Patient Has Alerts: Yes Has Dressing in Place as Prescribed: Yes Patient Alerts: Patient on Blood Has Compression in Place as Prescribed: Yes Thinner Pain Present Now: Yes aspirin 81 Electronic Signature(s) Signed: 07/22/2019 2:23:38 PM By: Lorine Bears RCP, RRT, CHT Entered By: Lorine Bears on 07/22/2019 12:51:49 Alamillo, [name] -------------------------------------------------------------------------------- Clinic Level of Care Assessment Details Patient Name: Amber, [name]. Date of Service: 07/22/2019 12:45 PM Medical Record Number: [id number] Patient Account Number: [id number] Date of Birth/Sex: Mar 25, 1946 (73 y.o. F) Treating RN: Harold Barban Primary Care Cleona Doubleday: [name] Other Clinician: Referring Kaydn Kumpf:  [name] Treating Kylen Schliep/Extender: Tito Dine in Treatment: 32 Clinic Level of Care Assessment Items TOOL 4 Quantity Score []  - Use when only an EandM is performed on FOLLOW-UP visit 0 ASSESSMENTS - Nursing Assessment / Reassessment X - Reassessment of Co-morbidities (includes updates in patient status) 1 10 X- 1 5 Reassessment of Adherence to Treatment Plan ASSESSMENTS - Wound and Skin Assessment / Reassessment []  - Simple Wound Assessment / Reassessment - one wound 0 X- 6 5 Complex Wound Assessment / Reassessment - multiple wounds []  - 0 Dermatologic / Skin Assessment (not related to wound area) ASSESSMENTS - Focused Assessment []  - Circumferential Edema Measurements - multi extremities 0 []  - 0 Nutritional Assessment / Counseling / Intervention []  - 0 Lower Extremity Assessment (monofilament, tuning fork, pulses) []  - 0 Peripheral Arterial Disease Assessment (using hand held doppler) ASSESSMENTS - Ostomy and/or Continence Assessment and Care []  - Incontinence Assessment and Management 0 []  - 0 Ostomy Care Assessment and Management (repouching, etc.) PROCESS - Coordination of Care []  - Simple Patient / Family Education for ongoing care 0 X- 1 20 Complex (extensive) Patient / Family Education for ongoing care []  - 0 Staff obtains Programmer, systems, Records, Test Results / Process Orders []  - 0 Staff telephones HHA, Nursing Homes / Clarify orders / etc []  - 0 Routine Transfer to another Facility (non-emergent condition) []  - 0 Routine Hospital Admission (non-emergent condition) []  - 0 New Admissions / Biomedical engineer / Ordering NPWT, Apligraf, etc. []  - 0 Emergency Hospital Admission (emergent condition) X- 1 10 Simple Discharge Coordination KESHONA, PULIAFICO ([id number]) []  - 0 Complex (extensive) Discharge Coordination PROCESS - Special Needs []  - Pediatric / Minor Patient Management 0 []  - 0 Isolation Patient Management []  - 0 Hearing /  Language / Visual special needs []  - 0 Assessment of Community assistance (transportation, D/C planning, etc.) []  - 0 Additional assistance / Altered mentation []  - 0 Support Surface(s) Assessment (bed, cushion, seat, etc.) INTERVENTIONS - Wound  Cleansing / Measurement []  - Simple Wound Cleansing - one wound 0 X- 6 5 Complex Wound Cleansing - multiple wounds X- 1 5 Wound Imaging (photographs - any number of wounds) []  - 0 Wound Tracing (instead of photographs) []  - 0 Simple Wound Measurement - one wound X- 6 5 Complex Wound Measurement - multiple wounds INTERVENTIONS - Wound Dressings []  - Small Wound Dressing one or multiple wounds 0 X- 6 15 Medium Wound Dressing one or multiple wounds []  - 0 Large Wound Dressing one or multiple wounds X- 1 5 Application of Medications - topical []  - 0 Application of Medications - injection INTERVENTIONS - Miscellaneous []  - External ear exam 0 []  - 0 Specimen Collection (cultures, biopsies, blood, body fluids, etc.) []  - 0 Specimen(s) / Culture(s) sent or taken to Lab for analysis []  - 0 Patient Transfer (multiple staff / Civil Service fast streamer / Similar devices) []  - 0 Simple Staple / Suture removal (25 or less) []  - 0 Complex Staple / Suture removal (26 or more) []  - 0 Hypo / Hyperglycemic Management (close monitor of Blood Glucose) []  - 0 Ankle / Brachial Index (ABI) - do not check if billed separately X- 1 5 Vital Signs Everard, Jemeka J. ([id number]) Has the patient been seen at the hospital within the last three years: Yes Total Score: 240 Level Of Care: New/Established - Level 5 Electronic Signature(s) Signed: 07/22/2019 4:25:56 PM By: Harold Barban Entered By: Harold Barban on 07/22/2019 13:35:25 Zappia, [name] ([id number]) -------------------------------------------------------------------------------- Encounter Discharge Information Details Patient Name: Amber, [name]. Date of Service: 07/22/2019 12:45 PM Medical Record  Number: [id number] Patient Account Number: [id number] Date of Birth/Sex: [birth year] (73 y.o. F) Treating RN: Army Melia Primary Care Jarrick Fjeld: [name] Other Clinician: Referring Ziv Welchel: [name] Treating Antonya Leeder/Extender: Tito Dine in Treatment: 39 Encounter Discharge Information Items Discharge Condition: Stable Ambulatory Status: Ambulatory Discharge Destination: Home Transportation: Private Auto Accompanied By: self Schedule Follow-up Appointment: Yes Clinical Summary of Care: Electronic Signature(s) Signed: 07/22/2019 2:09:59 PM By: Army Melia Entered By: Army Melia on 07/22/2019 14:09:59 Mamone, [name] ([id number]) -------------------------------------------------------------------------------- Lower Extremity Assessment Details Patient Name: MARLOE, TRUDO. Date of Service: 07/22/2019 12:45 PM Medical Record Number: [id number] Patient Account Number: [id number] Date of Birth/Sex: [birth year] (73 y.o. F) Treating RN: Army Melia Primary Care Yuuki Skeens: [name] Other Clinician: Referring Samhita Kretsch: [name] Treating Nahima Ales/Extender: [name] Dillon Weeks in Treatment: 32 Edema Assessment Assessed: [Left: No] [Right: No] Edema: [Left: N] [Right: o] Vascular Assessment Pulses: Dorsalis Pedis Palpable: [Left:Yes] Electronic Signature(s) Signed: 07/22/2019 2:16:26 PM By: Army Melia Entered By: Army Melia on 07/22/2019 13:04:48 Claflin, [name] ([id number]) -------------------------------------------------------------------------------- Multi Wound Chart Details Patient Name: SERAH, CIFARELLI. Date of Service: 07/22/2019 12:45 PM Medical Record Number: [id number] Patient Account Number: [id number] Date of Birth/Sex: 03-Nov-1946 (73 y.o. F) Treating RN: Harold Barban Primary Care Maurio Baize: [name] Other Clinician: Referring Lyrical Sowle: [name] Treating Lluvia Gwynne/Extender: Tito Dine  in Treatment: 32 Vital Signs Height(in): 63 Pulse(bpm): 58 Weight(lbs): 224.7 Blood Pressure(mmHg): 150/80 Body Mass Index(BMI): 40 Temperature(F): 99.0 Respiratory Rate 16 (breaths/min): Photos: Wound Location: Left Lower Leg - Medial Left Lower Leg - Lateral, Distal Left Lower Leg - Lateral, Proximal Wounding Event: Gradually Appeared Gradually Appeared Gradually Appeared Primary Etiology: Lymphedema Venous Leg Ulcer Venous Leg Ulcer Comorbid History: Cataracts, Asthma, Sleep Cataracts, Asthma, Sleep Cataracts, Asthma, Sleep Apnea, Deep Vein Apnea, Deep Vein Apnea, Deep Vein Thrombosis, Hypertension, Thrombosis, Hypertension, Thrombosis, Hypertension, Peripheral Venous Disease, Peripheral Venous Disease, Peripheral  Venous Disease, Osteoarthritis, Received Osteoarthritis, Received Osteoarthritis, Received Chemotherapy, Received Chemotherapy, Received Chemotherapy, Received Radiation Radiation Radiation Date Acquired: 11/19/2018 01/19/2019 07/08/2019 Weeks of Treatment: 32 26 2 Wound Status: Open Open Open Measurements L x W x D 6x7x0.1 4x4.2x0.1 4x4.5x0.1 (cm) Area (cm) : 32.987 13.195 14.137 Volume (cm) : 3.299 1.319 1.414 % Reduction in Area: -286.00% -5897.70% -272.70% % Reduction in Volume: -285.80% -5895.50% -273.10% Classification: Full Thickness Without Full Thickness Without Full Thickness Without Exposed Support Structures Exposed Support Structures Exposed Support Structures Exudate Amount: Medium Large Medium Exudate Type: Serosanguineous Serosanguineous Serosanguineous Exudate Color: red, brown red, brown red, brown Granulation Amount: Medium (34-66%) Medium (34-66%) Medium (34-66%) Granulation Quality: Red Red Red Necrotic Amount: Medium (34-66%) Medium (34-66%) Medium (34-66%) Exposed Structures: VIANNE, CEASAR ([id number]) Fat Layer (Subcutaneous Fat Layer (Subcutaneous Fat Layer (Subcutaneous Tissue) Exposed: Yes Tissue) Exposed: Yes Tissue) Exposed:  Yes Fascia: No Fascia: No Fascia: No Tendon: No Tendon: No Tendon: No Muscle: No Muscle: No Muscle: No Joint: No Joint: No Joint: No Bone: No Bone: No Bone: No Wound Number: 8 9 N/A Photos: N/A Wound Location: Left Lower Leg - Lateral Left Lower Leg - Lateral, N/A Posterior Wounding Event: Gradually Appeared Gradually Appeared N/A Primary Etiology: Venous Leg Ulcer Venous Leg Ulcer N/A Comorbid History: Cataracts, Asthma, Sleep Cataracts, Asthma, Sleep N/A Apnea, Deep Vein Apnea, Deep Vein Thrombosis, Hypertension, Thrombosis, Hypertension, Peripheral Venous Disease, Peripheral Venous Disease, Osteoarthritis, Received Osteoarthritis, Received Chemotherapy, Received Chemotherapy, Received Radiation Radiation Date Acquired: 07/08/2019 07/08/2019 N/A Weeks of Treatment: 2 2 N/A Wound Status: Open Open N/A Measurements L x W x D 4.8x4x0.1 4.5x4x0.1 N/A (cm) Area (cm) : 15.08 14.137 N/A Volume (cm) : 1.508 1.414 N/A % Reduction in Area: -531.50% -2402.10% N/A % Reduction in Volume: -531.00% -2380.70% N/A Classification: Full Thickness Without Full Thickness Without N/A Exposed Support Structures Exposed Support Structures Exudate Amount: Medium Medium N/A Exudate Type: Serosanguineous Serosanguineous N/A Exudate Color: red, brown red, brown N/A Granulation Amount: Medium (34-66%) Medium (34-66%) N/A Granulation Quality: Red Red N/A Necrotic Amount: Medium (34-66%) Medium (34-66%) N/A Exposed Structures: Fat Layer (Subcutaneous Fat Layer (Subcutaneous N/A Tissue) Exposed: Yes Tissue) Exposed: Yes Fascia: No Fascia: No Tendon: No Tendon: No Muscle: No Muscle: No Joint: No Joint: No Bone: No Bone: No Treatment Notes Electronic Signature(s) SAVINA, BARRIOS ([id number]) Signed: 07/22/2019 5:11:23 PM By: Linton Ham MD Entered By: Linton Ham on 07/22/2019 13:37:47 Vu, [name]  ([id number]) -------------------------------------------------------------------------------- Multi-Disciplinary Care Plan Details Patient Name: KAHLIYAH, ALIKHAN. Date of Service: 07/22/2019 12:45 PM Medical Record Number: [id number] Patient Account Number: [id number] Date of Birth/Sex: 04-07-1946 (73 y.o. F) Treating RN: Harold Barban Primary Care Franchesca Veneziano: [name] Other Clinician: Referring Zykera Abella: [name] Treating Rastus Borton/Extender: Tito Dine in Treatment: 60 Active Inactive Orientation to the Wound Care Program Nursing Diagnoses: Knowledge deficit related to the wound healing center program Goals: Patient/caregiver will verbalize understanding of the [facility] Program Date Initiated: 12/10/2018 Target Resolution Date: 01/09/2019 Goal Status: Active Interventions: Provide education on orientation to the wound center Notes: Soft Tissue Infection Nursing Diagnoses: Impaired tissue integrity Goals: Patient's soft tissue infection will resolve Date Initiated: 12/10/2018 Target Resolution Date: 01/09/2019 Goal Status: Active Interventions: Assess signs and symptoms of infection every visit Notes: Venous Leg Ulcer Nursing Diagnoses: Actual venous Insuffiency (use after diagnosis is confirmed) Goals: Patient will maintain optimal edema control Date Initiated: 12/10/2018 Target Resolution Date: 01/09/2019 Goal Status: Active Interventions: Assess peripheral edema status every visit. KATEY, ARCH ([id number])  Treatment Activities: Therapeutic compression applied : 12/10/2018 Notes: Wound/Skin Impairment Nursing Diagnoses: Impaired tissue integrity Goals: Patient/caregiver will verbalize understanding of skin care regimen Date Initiated: 12/10/2018 Target Resolution Date: 01/09/2019 Goal Status: Active Interventions: Assess ulceration(s) every visit Treatment Activities: Topical wound management initiated : 12/10/2018 Notes: Electronic  Signature(s) Signed: 07/22/2019 4:25:56 PM By: Harold Barban Entered By: Harold Barban on 07/22/2019 13:28:44 Xu, [name] -------------------------------------------------------------------------------- Pain Assessment Details Patient Name: IZZAH, ASANTE. Date of Service: 07/22/2019 12:45 PM Medical Record Number: [id number] Patient Account Number: [id number] Date of Birth/Sex: Nov 21, 1945 (73 y.o. F) Treating RN: Cornell Barman Primary Care Caspian Deleonardis: [name] Other Clinician: Referring Oslo Huntsman: [name] Treating Kristina Mcnorton/Extender: Tito Dine in Treatment: 32 Active Problems Location of Pain Severity and Description of Pain Patient Has Paino Yes Site Locations Rate the pain. Current Pain Level: 5 Pain Management and Medication Current Pain Management: Electronic Signature(s) Signed: 07/22/2019 2:23:38 PM By: Lorine Bears RCP, RRT, CHT Signed: 07/22/2019 5:22:27 PM By: [name], [name], RN, CWS, [name], [name] Entered By: Lorine Bears on 07/22/2019 12:52:01 SANDRINE, OSBERG ([id number]) -------------------------------------------------------------------------------- Patient/Caregiver Education Details Patient Name: MOLLYANN, BARROZO. Date of Service: 07/22/2019 12:45 PM Medical Record Number: [id number] Patient Account Number: [id number] Date of Birth/Gender: 1946-05-24 (73 y.o. F) Treating RN: Harold Barban Primary Care Physician: [name] Other Clinician: Referring Physician: [name] Treating Physician/Extender: Tito Dine in Treatment: 79 Education Assessment Education Provided To: Patient Education Topics Provided Wound/Skin Impairment: Handouts: Caring for Your Ulcer Methods: Demonstration, Explain/Verbal Responses: State content correctly Electronic Signature(s) Signed: 07/22/2019 4:25:56 PM By: Harold Barban Entered By: Harold Barban on 07/22/2019 13:29:39 Parodi,  [name] -------------------------------------------------------------------------------- Wound Assessment Details Patient Name: JAJAIRA, BOHLMAN. Date of Service: 07/22/2019 12:45 PM Medical Record Number: [id number] Patient Account Number: [id number] Date of Birth/Sex: 01-19-1946 (73 y.o. F) Treating RN: Army Melia Primary Care Esthela Brandner: [name] Other Clinician: Referring Miciah Covelli: [name] Treating Tanaisha Pittman/Extender: Tito Dine in Treatment: 32 Wound Status Wound Number: 5 Primary Lymphedema Etiology: Wound Location: Left Lower Leg - Medial Wound Open Wounding Event: Gradually Appeared Status: Date Acquired: 11/19/2018 Comorbid Cataracts, Asthma, Sleep Apnea, Deep Vein Weeks Of Treatment: 32 History: Thrombosis, Hypertension, Peripheral Venous Clustered Wound: No Disease, Osteoarthritis, Received Chemotherapy, Received Radiation Photos Wound Measurements Length: (cm) 6 Width: (cm) 7 Depth: (cm) 0.1 Area: (cm) 32.987 Volume: (cm) 3.299 % Reduction in Area: -286% % Reduction in Volume: -285.8% Tunneling: No Undermining: No Wound Description Full Thickness Without Exposed Support Fo Classification: Structures Sl Exudate Medium Amount: Exudate Type: Serosanguineous Exudate Color: red, brown ul Odor After Cleansing: No ough/Fibrino Yes Wound Bed Granulation Amount: Medium (34-66%) Exposed Structure Granulation Quality: Red Fascia Exposed: No Necrotic Amount: Medium (34-66%) Fat Layer (Subcutaneous Tissue) Exposed: Yes Necrotic Quality: Adherent Slough Tendon Exposed: No Muscle Exposed: No Joint Exposed: No Bone Exposed: No Luppino, Cadie J. ([id number]) Treatment Notes Wound #5 (Left, Medial Lower Leg) Notes zinc periwounds, silver ag, drawtex, ABD, 3 layer Electronic Signature(s) Signed: 07/22/2019 2:16:26 PM By: Army Melia Entered By: Army Melia on 07/22/2019 13:02:05 [name]  ([id number]) -------------------------------------------------------------------------------- Wound Assessment Details Patient Name: HEATER, OSUCH. Date of Service: 07/22/2019 12:45 PM Medical Record Number: [id number] Patient Account Number: [id number] Date of Birth/Sex: 01/09/1946 (73 y.o. F) Treating RN: Army Melia Primary Care Lani Mendiola: [name] Other Clinician: Referring Berwyn Bigley: [name] Treating Amairany Schumpert/Extender: Tito Dine in Treatment: 32 Wound Status Wound Number: 6 Primary Venous Leg Ulcer Etiology: Wound Location:  Left Lower Leg - Lateral, Distal Wound Open Wounding Event: Gradually Appeared Status: Date Acquired: 01/19/2019 Comorbid Cataracts, Asthma, Sleep Apnea, Deep Vein Weeks Of Treatment: 26 History: Thrombosis, Hypertension, Peripheral Venous Clustered Wound: No Disease, Osteoarthritis, Received Chemotherapy, Received Radiation Photos Wound Measurements Length: (cm) 4 Width: (cm) 4.2 Depth: (cm) 0.1 Area: (cm) 13.195 Volume: (cm) 1.319 % Reduction in Area: -5897.7% % Reduction in Volume: -5895.5% Tunneling: No Undermining: No Wound Description Full Thickness Without Exposed Support Classification: Structures Exudate Large Amount: Exudate Type: Serosanguineous Exudate Color: red, brown Foul Odor After Cleansing: No Slough/Fibrino Yes Wound Bed Granulation Amount: Medium (34-66%) Exposed Structure Granulation Quality: Red Fascia Exposed: No Necrotic Amount: Medium (34-66%) Fat Layer (Subcutaneous Tissue) Exposed: Yes Necrotic Quality: Adherent Slough Tendon Exposed: No Muscle Exposed: No Joint Exposed: No Bone Exposed: No Butrum, Cerenity J. ([id number]) Treatment Notes Wound #6 (Left, Distal, Lateral Lower Leg) Notes zinc periwounds, silver ag, drawtex, ABD, 3 layer Electronic Signature(s) Signed: 07/22/2019 2:16:26 PM By: Army Melia Entered By: Army Melia on 07/22/2019 13:01:43 Hendrickson, [name]  ([id number]) -------------------------------------------------------------------------------- Wound Assessment Details Patient Name: HEATHERLY, WEES. Date of Service: 07/22/2019 12:45 PM Medical Record Number: [id number] Patient Account Number: [id number] Date of Birth/Sex: 09/15/1946 (73 y.o. F) Treating RN: Army Melia Primary Care Bonnetta Allbee: [name] Other Clinician: Referring Renly Roots: [name] Treating Donnie Gedeon/Extender: Tito Dine in Treatment: 32 Wound Status Wound Number: 7 Primary Venous Leg Ulcer Etiology: Wound Location: Left Lower Leg - Lateral, Proximal Wound Open Wounding Event: Gradually Appeared Status: Date Acquired: 07/08/2019 Comorbid Cataracts, Asthma, Sleep Apnea, Deep Vein Weeks Of Treatment: 2 History: Thrombosis, Hypertension, Peripheral Venous Clustered Wound: No Disease, Osteoarthritis, Received Chemotherapy, Received Radiation Photos Wound Measurements Length: (cm) 4 Width: (cm) 4.5 Depth: (cm) 0.1 Area: (cm) 14.137 Volume: (cm) 1.414 % Reduction in Area: -272.7% % Reduction in Volume: -273.1% Tunneling: No Undermining: No Wound Description Full Thickness Without Exposed Support Fo Classification: Structures Sl Exudate Medium Amount: Exudate Type: Serosanguineous Exudate Color: red, brown ul Odor After Cleansing: No ough/Fibrino Yes Wound Bed Granulation Amount: Medium (34-66%) Exposed Structure Granulation Quality: Red Fascia Exposed: No Necrotic Amount: Medium (34-66%) Fat Layer (Subcutaneous Tissue) Exposed: Yes Necrotic Quality: Adherent Slough Tendon Exposed: No Muscle Exposed: No Joint Exposed: No Bone Exposed: No Requejo, Haddy J. ([id number]) Treatment Notes Wound #7 (Left, Proximal, Lateral Lower Leg) Notes zinc periwounds, silver ag, drawtex, ABD, 3 layer Electronic Signature(s) Signed: 07/22/2019 2:16:26 PM By: Army Melia Entered By: Army Melia on 07/22/2019 13:02:42 Barbone, [name]  ([id number]) -------------------------------------------------------------------------------- Wound Assessment Details Patient Name: LISI, CARVELLI. Date of Service: 07/22/2019 12:45 PM Medical Record Number: [id number] Patient Account Number: [id number] Date of Birth/Sex: Oct 04, 1946 (73 y.o. F) Treating RN: Army Melia Primary Care Carman Auxier: [name] Other Clinician: Referring Evanna Washinton: [name] Treating Beulah Capobianco/Extender: Tito Dine in Treatment: 32 Wound Status Wound Number: 8 Primary Venous Leg Ulcer Etiology: Wound Location: Left Lower Leg - Lateral Wound Open Wounding Event: Gradually Appeared Status: Date Acquired: 07/08/2019 Comorbid Cataracts, Asthma, Sleep Apnea, Deep Vein Weeks Of Treatment: 2 History: Thrombosis, Hypertension, Peripheral Venous Clustered Wound: No Disease, Osteoarthritis, Received Chemotherapy, Received Radiation Photos Wound Measurements Length: (cm) 4.8 Width: (cm) 4 Depth: (cm) 0.1 Area: (cm) 15.08 Volume: (cm) 1.508 % Reduction in Area: -531.5% % Reduction in Volume: -531% Tunneling: No Undermining: No Wound Description Full Thickness Without Exposed Support Fo Classification: Structures Sl Exudate Medium Amount: Exudate Type: Serosanguineous Exudate Color: red, brown ul Odor After Cleansing: No ough/Fibrino Yes Wound Bed  Granulation Amount: Medium (34-66%) Exposed Structure Granulation Quality: Red Fascia Exposed: No Necrotic Amount: Medium (34-66%) Fat Layer (Subcutaneous Tissue) Exposed: Yes Necrotic Quality: Adherent Slough Tendon Exposed: No Muscle Exposed: No Joint Exposed: No Bone Exposed: No Lizotte, Celesta J. ([id number]) Treatment Notes Wound #8 (Left, Lateral Lower Leg) Notes zinc periwounds, silver ag, drawtex, ABD, 3 layer Electronic Signature(s) Signed: 07/22/2019 2:16:26 PM By: Army Melia Entered By: Army Melia on 07/22/2019 13:03:16 Redner, [name]  ([id number]) -------------------------------------------------------------------------------- Wound Assessment Details Patient Name: TANEYAH, ABDELAZIZ. Date of Service: 07/22/2019 12:45 PM Medical Record Number: [id number] Patient Account Number: [id number] Date of Birth/Sex: 03-04-1946 (73 y.o. F) Treating RN: Army Melia Primary Care Erby Sanderson: [name] Other Clinician: Referring Javontae Marlette: [name] Treating Paxon Propes/Extender: Tito Dine in Treatment: 32 Wound Status Wound Number: 9 Primary Venous Leg Ulcer Etiology: Wound Location: Left Lower Leg - Lateral, Posterior Wound Open Wounding Event: Gradually Appeared Status: Date Acquired: 07/08/2019 Comorbid Cataracts, Asthma, Sleep Apnea, Deep Vein Weeks Of Treatment: 2 History: Thrombosis, Hypertension, Peripheral Venous Clustered Wound: No Disease, Osteoarthritis, Received Chemotherapy, Received Radiation Photos Wound Measurements Length: (cm) 4.5 Width: (cm) 4 Depth: (cm) 0.1 Area: (cm) 14.137 Volume: (cm) 1.414 % Reduction in Area: -2402.1% % Reduction in Volume: -2380.7% Tunneling: No Undermining: No Wound Description Full Thickness Without Exposed Support Classification: Structures Exudate Medium Amount: Exudate Type: Serosanguineous Exudate Color: red, brown Foul Odor After Cleansing: No Slough/Fibrino Yes Wound Bed Granulation Amount: Medium (34-66%) Exposed Structure Granulation Quality: Red Fascia Exposed: No Necrotic Amount: Medium (34-66%) Fat Layer (Subcutaneous Tissue) Exposed: Yes Necrotic Quality: Adherent Slough Tendon Exposed: No Muscle Exposed: No Joint Exposed: No Bone Exposed: No Rakestraw, Pete J. ([id number]) Treatment Notes Wound #9 (Left, Lateral, Posterior Lower Leg) Notes zinc periwounds, silver ag, drawtex, ABD, 3 layer Electronic Signature(s) Signed: 07/22/2019 2:16:26 PM By: Army Melia Entered By: Army Melia on 07/22/2019 13:04:29 Iden, [name]  ([id number]) -------------------------------------------------------------------------------- Vitals Details Patient Name: SHAWONA, KOMPERDA. Date of Service: 07/22/2019 12:45 PM Medical Record Number: [id number] Patient Account Number: [id number] Date of Birth/Sex: 11-May-1946 (73 y.o. F) Treating RN: Cornell Barman Primary Care Yared Susan: [name] Other Clinician: Referring Kewanda Poland: [name] Treating Agam Tuohy/Extender: Tito Dine in Treatment: 32 Vital Signs Time Taken: 12:50 Temperature (F): 99.0 Height (in): 63 Pulse (bpm): 89 Weight (lbs): 224.7 Respiratory Rate (breaths/min): 16 Body Mass Index (BMI): 39.8 Blood Pressure (mmHg): 150/80 Reference Range: 80 - 120 mg / dl Electronic Signature(s) Signed: 07/22/2019 2:23:38 PM By: Lorine Bears RCP, RRT, CHT Entered By: Lorine Bears on 07/22/2019 12:54:15

## 2019-07-23 LAB — AEROBIC CULTURE W GRAM STAIN (SUPERFICIAL SPECIMEN)

## 2019-07-24 ENCOUNTER — Other Ambulatory Visit: Payer: Self-pay | Admitting: Family Medicine

## 2019-07-27 ENCOUNTER — Other Ambulatory Visit: Payer: Self-pay

## 2019-07-27 DIAGNOSIS — M1990 Unspecified osteoarthritis, unspecified site: Secondary | ICD-10-CM | POA: Diagnosis not present

## 2019-07-27 DIAGNOSIS — G4730 Sleep apnea, unspecified: Secondary | ICD-10-CM | POA: Diagnosis not present

## 2019-07-27 DIAGNOSIS — J45909 Unspecified asthma, uncomplicated: Secondary | ICD-10-CM | POA: Diagnosis not present

## 2019-07-27 DIAGNOSIS — L97221 Non-pressure chronic ulcer of left calf limited to breakdown of skin: Secondary | ICD-10-CM | POA: Diagnosis not present

## 2019-07-27 DIAGNOSIS — I10 Essential (primary) hypertension: Secondary | ICD-10-CM | POA: Diagnosis not present

## 2019-07-27 DIAGNOSIS — I890 Lymphedema, not elsewhere classified: Secondary | ICD-10-CM | POA: Diagnosis not present

## 2019-07-27 NOTE — Progress Notes (Signed)
[name] Visit Report for 07/27/2019 Arrival Information Details Patient Name: [name], [name]. Date of Service: 07/27/2019 3:15 PM Medical Record Number: [id number] Patient Account Number: [id number] Date of Birth/Sex: 1946/10/08 (73 y.o. F) Treating RN: Cornell Barman Primary Care Haly Feher: [name] Other Clinician: Referring Brennden [name]: [name] Treating Terek Bee/Extender: Melburn Hake, HOYT Weeks in Treatment: 77 Visit Information History Since Last Visit Added or deleted any medications: No Patient Arrived: Ambulatory Any new allergies or adverse reactions: No Arrival Time: 15:29 Had a fall or experienced change in No Accompanied By: self activities of daily living that may affect Transfer Assistance: None risk of falls: Patient Identification Verified: Yes Signs or symptoms of abuse/neglect since last visito No Secondary Verification Process Yes Hospitalized since last visit: No Completed: Implantable device outside of the clinic excluding No Patient Requires Transmission-Based No cellular tissue based products placed in the center Precautions: since last visit: Patient Has Alerts: Yes Has Dressing in Place as Prescribed: Yes Patient Alerts: Patient on Blood Pain Present Now: No Thinner aspirin 81 Electronic Signature(s) Signed: 07/27/2019 4:50:52 PM By: [name], [name], RN, CWS, [name], [name] Entered By: [name], [name], RN, CWS, Kim on 07/27/2019 15:30:15 [name] -------------------------------------------------------------------------------- Compression Therapy Details Patient Name: [name]. Date of Service: 07/27/2019 3:15 PM Medical Record Number: [id number] Patient Account Number: [id number] Date of Birth/Sex: 09-23-1946 (73 y.o. F) Treating RN: Cornell Barman Primary Care Mertis Mosher: [name] Other Clinician: Referring Alyanna Stoermer: [name] Treating Toan Mort/Extender: Melburn Hake, HOYT Weeks in Treatment:  32 Compression Therapy Performed for Wound Assessment: Wound #5 Left,Medial Lower Leg Performed By: Clinician Cornell Barman, RN Compression Type: Three Layer Pre Treatment ABI: 1 Electronic Signature(s) Signed: 07/27/2019 4:50:52 PM By: [name], [name], RN, CWS, [name], [name] Entered By: [name], [name], RN, CWS, Kim on 07/27/2019 15:42:25 [name] -------------------------------------------------------------------------------- Encounter Discharge Information Details Patient Name: [name]. Date of Service: 07/27/2019 3:15 PM Medical Record Number: [id number] Patient Account Number: [id number] Date of Birth/Sex: [name] 14, 1946 (73 y.o. F) Treating RN: Cornell Barman Primary Care Edenilson Austad: [name] Other Clinician: Referring Jorah Hua: [name] Treating Saron Vanorman/Extender: Melburn Hake, HOYT Weeks in Treatment: 89 Encounter Discharge Information Items Discharge Condition: Stable Ambulatory Status: Ambulatory Discharge Destination: Home Transportation: Private Auto Accompanied By: self Schedule Follow-up Appointment: Yes Clinical Summary of Care: Electronic Signature(s) Signed: 07/27/2019 4:50:52 PM By: [name], [name], RN, CWS, [name], [name] Entered By: [name], [name], RN, CWS, Kim on 07/27/2019 [place], [place]. ([id number]) -------------------------------------------------------------------------------- Wound Assessment Details Patient Name: [name]. Date of Service: 07/27/2019 3:15 PM Medical Record Number: [id number] Patient Account Number: [id number] Date of Birth/Sex: 1946/04/05 (73 y.o. F) Treating RN: Cornell Barman Primary Care Drew Lips: [name] Other Clinician: Referring Chen Saadeh: [name] Treating Erlene Devita/Extender: Melburn Hake, HOYT Weeks in Treatment: 32 Wound Status Wound Number: 5 Primary Etiology: Lymphedema Wound Location: Left, Medial Lower Leg Wound Status: Open Wounding Event: Gradually Appeared Date Acquired:  11/19/2018 Weeks Of Treatment: 32 Clustered Wound: No Wound Measurements Length: (cm) 6 Width: (cm) 7 Depth: (cm) 0.1 Area: (cm) 32.987 Volume: (cm) 3.299 % Reduction in Area: -286% % Reduction in Volume: -285.8% Wound Description Full Thickness Without Exposed Support Classification: Structures Treatment Notes Wound #5 (Left, Medial Lower Leg) Notes Zinc, Silver Cell, absorbent dressing, 3L (L) Electronic Signature(s) Signed: 07/27/2019 4:50:52 PM By: [name], [name], RN, CWS, [name], [name] Entered By: [name], [name], RN, CWS, Kim on 07/27/2019 15:42:02 [name], [name] -------------------------------------------------------------------------------- Wound Assessment Details Patient Name: [name], [name] Date of Service: 07/27/2019 3:15 PM Medical Record Number: [id number] Patient Account Number: [id number] Date of Birth/Sex: 1946-05-16 (73 y.o. F) Treating RN: Cornell Barman Primary Care Enyla Lisbon: [name] Other Clinician: Referring Vertis Bauder: [name] Treating Larico Dimock/Extender: Melburn Hake, HOYT Weeks in Treatment: 32 Wound Status Wound Number: 6 Primary Etiology: Venous Leg Ulcer Wound Location: Left, Distal, Lateral Lower Leg Wound Status: Open Wounding Event: Gradually Appeared Date Acquired: 01/19/2019 Weeks Of Treatment: 27 Clustered Wound: No Wound Measurements Length: (cm) 4 Width: (cm) 4 Depth: (cm) 0.1 Area: (cm) 12.566 Volume: (cm) 1.257 % Reduction in Area: -5611.8% % Reduction in Volume: -5613.6% Wound Description Full Thickness Without Exposed Support Classification: Structures Treatment Notes Wound #6 (Left, Distal, Lateral Lower Leg) Notes Zinc, Silver Cell, absorbent dressing, 3L (L) Electronic Signature(s) Signed: 07/27/2019 4:50:52 PM By: [name], [name], RN, CWS, [name], [name] Entered By: [name], [name], RN, CWS, Kim on 07/27/2019 15:42:02 [name], [name]  ([id number]) -------------------------------------------------------------------------------- Wound Assessment Details Patient Name: [name]. Date of Service: 07/27/2019 3:15 PM Medical Record Number: [id number] Patient Account Number: [id number] Date of Birth/Sex: 03-30-1946 (73 y.o. F) Treating RN: Cornell Barman Primary Care Madalina Rosman: [name] Other Clinician: Referring Maddux Vanscyoc: [name] Treating Chetara Kropp/Extender: Melburn Hake, HOYT Weeks in Treatment: 32 Wound Status Wound Number: 7 Primary Etiology: Venous Leg Ulcer Wound Location: Left, Proximal, Lateral Lower Leg Wound Status: Open Wounding Event: Gradually Appeared Date Acquired: 07/08/2019 Weeks Of Treatment: 2 Clustered Wound: No Wound Measurements Length: (cm) 4 Width: (cm) 4.2 Depth: (cm) 0.1 Area: (cm) 13.195 Volume: (cm) 1.319 % Reduction in Area: -247.9% % Reduction in Volume: -248% Wound Description Full Thickness Without Exposed Support Classification: Structures Treatment Notes Wound #7 (Left, Proximal, Lateral Lower Leg) Notes Zinc, Silver Cell, absorbent dressing, 3L (L) Electronic Signature(s) Signed: 07/27/2019 4:50:52 PM By: [name], [name], RN, CWS, [name], [name] Entered By: [name], [name], RN, CWS, Kim on 07/27/2019 15:42:02 [name], [name] -------------------------------------------------------------------------------- Wound Assessment Details Patient Name: FALENA, [place]. Date of Service: 07/27/2019 3:15 PM Medical Record Number: [id number] Patient Account Number: [id number] Date of Birth/Sex: [birth year] (73 y.o. F) Treating RN: Cornell Barman Primary Care Nichola Warren: [name] Other Clinician: Referring Keeara Frees: [name] Treating Kano Heckmann/Extender: Melburn Hake, HOYT Weeks in Treatment: 32 Wound Status Wound Number: 8 Primary Etiology: Venous Leg Ulcer Wound Location: Left, Lateral Lower Leg Wound Status: Open Wounding Event: Gradually Appeared Date Acquired:  07/08/2019 Weeks Of Treatment: 2 Clustered Wound: No Wound Measurements Length: (cm) 4.8 Width: (cm) 4 Depth: (cm) 0.1 Area: (cm) 15.08 Volume: (cm) 1.508 % Reduction in Area: -531.5% % Reduction in Volume: -531% Wound Description Full Thickness Without Exposed Support Classification: Structures Treatment Notes Wound #8 (Left, Lateral Lower Leg) Notes Zinc, Silver Cell, absorbent dressing, 3L (L) Electronic Signature(s) Signed: 07/27/2019 4:50:52 PM By: [name], [name], RN, CWS, [name], [name] Entered By: [name], [name], RN, CWS, Kim on 07/27/2019 15:42:02 [name], [name] -------------------------------------------------------------------------------- Wound Assessment Details Patient Name: [name], [name]. Date of Service: 07/27/2019 3:15 PM Medical Record Number: [id number] Patient Account Number: [id number] Date of Birth/Sex: 1946/05/28 (73 y.o. F) Treating RN: Cornell Barman Primary Care Domnick Chervenak: [name] Other Clinician: Referring Montrelle Eddings: [name] Treating Tawnya Pujol/Extender: STONE III, HOYT Weeks in Treatment: 32 Wound Status Wound Number: 9 Primary Etiology: Venous Leg Ulcer Wound Location: Left, Lateral, Posterior Lower Leg Wound Status: Open Wounding Event: Gradually Appeared Date Acquired: 07/08/2019 Weeks Of Treatment: 2 Clustered Wound: No Wound Measurements Length: (cm) 4.5 Width: (cm) 4 Depth: (cm) 0.1 Area: (cm)  14.137 Volume: (cm) 1.414 % Reduction in Area: -2402.1% % Reduction in Volume: -2380.7% Wound Description Full Thickness Without Exposed Support Classification: Structures Treatment Notes Wound #9 (Left, Lateral, Posterior Lower Leg) Notes Zinc, Silver Cell, absorbent dressing, 3L (L) Electronic Signature(s) Signed: 07/27/2019 4:50:52 PM By: [name], [name], RN, CWS, [name], [name] Entered By: [name], [name], RN, CWS, Kim on 07/27/2019 15:42:02

## 2019-07-28 ENCOUNTER — Other Ambulatory Visit: Payer: Self-pay | Admitting: Family Medicine

## 2019-07-28 MED ORDER — NORTRIPTYLINE HCL 25 MG PO CAPS: 25.0000 mg | ORAL_CAPSULE | Freq: Two times a day (BID) | ORAL | 3 refills | Status: DC

## 2019-07-28 NOTE — Telephone Encounter (Signed)
Last rx:  05/24/19 Last OV:  12/05/18, acute Next OV:  10/14/19, CPE prt 2

## 2019-07-28 NOTE — Telephone Encounter (Signed)
Best number [phone]  Pt called wanting to see if dr g will refill rx on nortiptyline  The pharmacy sent request to  [facility] to have this refilled .  Dr Lynann Bologna retired and they will not  Refill this for her  [facility]  Pt only has enough pills to last till Thursday  Please advise

## 2019-07-29 ENCOUNTER — Other Ambulatory Visit: Payer: Self-pay

## 2019-07-29 ENCOUNTER — Encounter: Payer: Medicare Other | Admitting: Internal Medicine

## 2019-07-29 DIAGNOSIS — L97221 Non-pressure chronic ulcer of left calf limited to breakdown of skin: Secondary | ICD-10-CM | POA: Diagnosis not present

## 2019-07-29 DIAGNOSIS — G4730 Sleep apnea, unspecified: Secondary | ICD-10-CM | POA: Diagnosis not present

## 2019-07-29 DIAGNOSIS — I890 Lymphedema, not elsewhere classified: Secondary | ICD-10-CM | POA: Diagnosis not present

## 2019-07-29 DIAGNOSIS — L97829 Non-pressure chronic ulcer of other part of left lower leg with unspecified severity: Secondary | ICD-10-CM | POA: Diagnosis not present

## 2019-07-29 DIAGNOSIS — L97222 Non-pressure chronic ulcer of left calf with fat layer exposed: Secondary | ICD-10-CM | POA: Diagnosis not present

## 2019-07-29 DIAGNOSIS — I10 Essential (primary) hypertension: Secondary | ICD-10-CM | POA: Diagnosis not present

## 2019-07-29 DIAGNOSIS — M1990 Unspecified osteoarthritis, unspecified site: Secondary | ICD-10-CM | POA: Diagnosis not present

## 2019-07-29 DIAGNOSIS — I87312 Chronic venous hypertension (idiopathic) with ulcer of left lower extremity: Secondary | ICD-10-CM | POA: Diagnosis not present

## 2019-07-29 DIAGNOSIS — J45909 Unspecified asthma, uncomplicated: Secondary | ICD-10-CM | POA: Diagnosis not present

## 2019-07-30 ENCOUNTER — Other Ambulatory Visit
Admission: RE | Admit: 2019-07-30 | Discharge: 2019-07-30 | Disposition: A | Discharge status: Home / Self Care | Payer: Medicare Other | Source: Ambulatory Visit | Attending: Vascular Surgery | Admitting: Vascular Surgery

## 2019-07-30 DIAGNOSIS — Z20828 Contact with and (suspected) exposure to other viral communicable diseases: Secondary | ICD-10-CM | POA: Diagnosis not present

## 2019-07-30 DIAGNOSIS — Z01812 Encounter for preprocedural laboratory examination: Secondary | ICD-10-CM | POA: Insufficient documentation

## 2019-07-30 NOTE — Progress Notes (Signed)
[name], [name] Visit Report for 07/29/2019 HPI Details Patient Name: [name], JOU. Date of Service: 07/29/2019 12:45 PM Medical Record Number: [id number] Patient Account Number: [id number] Date of Birth/Sex: February 16, 1946 (73 y.o. F) Treating RN: Cornell Barman Primary Care Provider: [name] Other Clinician: Referring Provider: [name] Treating Provider/Extender: Tito Dine in Treatment: 53 History of Present Illness HPI Description: Pleasant 73 year old with history of chronic venous insufficiency. No diabetes or peripheral vascular disease. Left ABI 1.29. Questionable history of left lower extremity DVT. She developed a recurrent ulceration on her left lateral calf in December 2015, which she attributes to poor diet and subsequent lower extremity edema. She underwent endovenous laser ablation of her left greater saphenous vein in 2010. She underwent laser ablation of accessory branch of left GSV in April 2016 by Dr. Kellie Simmering at [facility]. She was previously wearing Unna boots, which she tolerated well. Tolerating 2 layer compression and cadexomer iodine. She returns to clinic for follow-up and is without new complaints. She denies any significant pain at this time. She reports persistent pain with pressure. No claudication or ischemic rest pain. No fever or chills. No drainage. READMISSION 11/13/16; this is a 73 year old woman who is not a diabetic. She is here for a review of a painful area on her left medial lower extremity. I note that she was seen here previously last year for wound I believe to be in the same area. At that time she had undergone previously a left greater saphenous vein ablation by Dr. Kellie Simmering and she had a ablation of the anterior accessory branch of the left greater saphenous vein in March 2016. Seeing that the wound actually closed over. In reviewing the history with her today the ulcer in this area has been recurrent. She  describes a biopsy of this area in 2009 that only showed stasis physiology. 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 Do not see that she has ever had arterial studies in the left leg. She tells me she has a follow-up with Dr. Kellie Simmering in roughly 10 days In any case she developed the reopening of this area roughly a month ago. On the background of this she describes rapidly increasing edema which has responded to Lasix 40 mg and metolazone 2.5 mg as well as the patient's lymph massage. She has been told she has both venous insufficiency and lymphedema but she cannot tolerate compression stockings 11/28/16; the patient saw Dr. Kellie Simmering recently. Per the patient he did arterial Dopplers in the office that did not show evidence of arterial insufficiency, per the patient he stated "treat this like an ordinary venous ulcer". She also saw her dermatologist Dr. Ronnald Ramp who felt that this was more of a vascular ulcer. In general things are improving although she arrives today with increasing bilateral lower extremity edema with weeping a deeper fluid through the wound on the left medial leg compatible with some degree of lymphedema 12/04/16; the patient's wound is fully epithelialized but I don't think fully healed. We will do another week of depression with Promogran and TCA however I suspect we'll be able to discharge her next week. This is a very unusual-looking wound which was initially a figure-of-eight type wound lying on its side surrounded by petechial like hemorrhage. She  has had venous ablation on this side. She apparently does not have an arterial issue per Dr. Kellie Simmering. She saw her dermatologist thought it was "vascular". Patient is definitely going to need ongoing compression and I  talked about this with her today she will go to elastic therapy after she leaves here next week 12/11/16; the patient's wound is not completely closed today. She has surrounding scar tissue and in further discussion with the patient it would appear that she had ulcers in this area in 2009 for a prolonged period of time ultimately requiring a punch biopsy of this area that only showed venous insufficiency. I did not previously pickup on this part of the history from the patient. 12/18/16; the patient's wound is completely epithelialized. There is no open area here. She has significant bilateral venous insufficiency with secondary lymphedema to a mild-to-moderate degree she does not have compression stockings.. She did not say anything to me when I was in the room, she told our intake nurse that she was still having pain in this area. This isn't [name], [name] unusual recurrent small open area. She is going to go to elastic therapy to obtain compression stockings. 12/25/16; the patient's wound is fully epithelialized. There is no open area here. The patient describes some continued episodic discomfort in this area medial left calf. However everything looks fine and healed here. She is been to elastic therapy and caught herself 15-20 mmHg stockings, they apparently were having trouble getting 20-30 mm stockings in her size 01/22/17; this is a patient we discharged from the clinic a month ago. She has a recurrent open wound on her medial left calf. She had 73 mm support stockings. I told her I thought she needed 20-30 mm compression stockings. She tells me that she has been ill with hospitalization secondary to asthma and is been found to have severe hypokalemia likely secondary to a combination of Lasix and metolazone. This morning she noted blistering and leaking fluid on the posterior part of her left leg. She called our intake nurse urgently and we was saw her this afternoon. She has not  had any real discomfort here. I don't know that she's been wearing any stockings on this leg for at least 2-3 days. ABIs in this clinic were 1.21 on the right and 1.3 on the left. She is previously seen vascular surgery who does not think that there is a peripheral arterial issue. 01/30/17; Patient arrives with no open wound on the left leg. She has been to elastic therapy and obtained 20-75mmhg below knee stockings and she has one on the right leg today. READMISSION 02/19/18; this Forgacs is a now 73 year old patient we've had in this clinic perhaps 3 times before. I had last looked at her from January 07 December 2016 with an area on the medial left leg. We discharged her on 12/25/16 however she had to be readmitted on 01/22/17 with a recurrence. I have in my notes that we discharged her on 20-30 mm stockings although she tells me she was only wearing support hose because she cannot get stockings on predominantly related to her cervical spine surgery/issues. She has had previous ablations done by vein and vascular in [place] including a great saphenous vein ablation on the left with an anterior accessory branch ablation I think both of these were in 2016. On one of the previous visit she had a biopsy noted 2009 that was negative. She is not felt to have an arterial issue. She is not  a diabetic. She does have a history of obstructive sleep apnea hypertension asthma as well as chronic venous insufficiency and lymphedema. On this occasion she noted 2 dry scaly patch on her left leg. She tried to put lotion on this it didn't really help. There were 2 open areas.the patient has been seeing her primary physician from 02/05/18 through 02/14/18. She had Unna boots applied. The superior wound now on the lateral left leg has closed but she's had one wound that remains open on the lateral left leg. This is not the same spot as we dealt with in 2018. ABIs in this clinic were 1.3 bilaterally 02/26/18; patient has a  small wound on the left lateral calf. Dimensions are down. She has chronic venous insufficiency and lymphedema. 03/05/18; small open area on the left lateral calf. Dimensions are down. Tightly adherent necrotic debris over the surface of the wound which was difficult to remove. Also the dressing [over collagen] stuck to the wound surface. This was removed with some difficulty as well. Change the primary dressing to Hydrofera Blue ready 03/12/18; small open area on the left lateral calf. Comes in with tightly adherent surface eschar as well as some adherent Hydrofera Blue. 03/19/18; open area on the left lateral calf. Again adherent surface eschar as well as some adherent Hydrofera Blue nonviable subcutaneous tissue. She complained of pain all week even with the reduction from 4-3 layer compression I put on last week. Also she had an increase in her ankle and calf measurements probably related to the same thing. 03/26/18; open area on the left lateral calf. A very small open area remains here. We used silver alginate starting last week as the Hydrofera Blue seem to stick to the wound bed. In using 4-layer compression 04/02/18; the open area in the left lateral calf at some adherent slough which I removed there is no open area here. We are able to transition her into her own compression stocking. Truthfully I think this is probably his support hose. However this does not maintain skin integrity will be limited. She cannot put over the toe compression stockings on because of neck problems hand problems etc. She is allergic to the lining layer of juxta lites. We might be forced to use extremitease stocking should this fail READMIT 11/24/2018 Patient is now a 73 year old woman who is not a diabetic. She has been in this clinic on at least 3 previous occasions largely with recurrent wounds on her left leg secondary to chronic venous insufficiency with secondary lymphedema. Her situation is complicated by  inability to get stockings on and an allergy to neoprene which is apparently a component and at least juxta lites and other stockings. As a result she really has not been wearing any stockings on her legs. She tells [place] that roughly 2 or 3 weeks ago she started noticing a stinging sensation just above her ankle on the left medial aspect. She has been diagnosed with pseudogout and she wondered whether this was what she was experiencing. She tried to dress this with something she bought at the store however subsequently it pulled skin off and now she has an open wound that is not improving. She has been using Vaseline gauze with a cover bandage. She saw her primary doctor last week who [name], [name] put an Unna boot on her. ABIs in this clinic was 1.03 on the left 2/12; the area is on the left medial ankle. Odd-looking wound with what looks to be surface epithelialization  but a multitude of small petechial openings. This clearly not closed yet. We have been using silver alginate under 3 layer compression with TCA 2/19; the wound area did not look quite as good this week. Necrotic debris over the majority of the wound surface which required debridement. She continues to have a multitude of what looked to be small petechial openings. She reminds [place] that she had a biopsy on this initially during her first outbreak in 2015 in [place] [facility]. She expresses concern about this being a possible melanoma. 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 I assured her that this does not look anything like melanoma. Except for the petechial reaction it does look like a venous insufficiency area and she certainly has evidence of this on both sides 2/26; a difficult area on the left medial ankle. The patient clearly has chronic venous hypertension with some degree of lymphedema. The odd thing about the area is  the small petechial hemorrhages. I am not really sure how to explain this. This was present last time and this is not a compression injury. We have been using Hydrofera Blue which I changed to last week 3/4; still using Hydrofera Blue. Aggressive debridement today. She does not have known arterial issues. She has seen Dr. Kellie Simmering at [facility] vein and vascular and and has an ablation on the left. [Anterior accessory branch of the greater saphenous]. From what I remember they did not feel she had an arterial issue. The patient has had this area biopsied in 2009 at [facility] dermatology and by her recollection they said this was "stasis". She is also follow-up with dermatology locally who thought that this was more of a vascular issue 3/11; using Hydrofera Blue. Aggressive debridement today. She does not have an arterial issue. We are using 3 layer compression although we may need to go to 4. The patient has been in for multiple changes to her wrap since I last saw her a week ago. She says that the area was leaking. I do not have too much more information on what was found 01/19/19 on evaluation today patient was actually being seen for a nurse visit when unfortunately she had the area on her left lateral lower extremity as well as weeping from the right lower extremity that became apparent. Therefore we did end up actually seeing her for a full visit with myself. She is having some pain at this site as well but fortunately nothing too significant at this point. No fevers, chills, nausea, or vomiting noted at this time. 3/18-Patient is back to the clinic with the left leg venous leg ulcer, the ulcer is larger in size, has a surface that is densely adherent with fibrinous tissue, the Hydrofera Blue was used but is densely adherent and there was difficulty in removing it. The right lower extremity was also wrapped for weeping edema. Patient has a new area over the left lateral foot above the malleolus  that is small and appears to have no debris with intact surrounding skin. Patient is on increased dose of Lasix also as a means to edema management 3/25; the patient has a nonhealing venous ulcer on the medial left leg and last week developed a smaller area on the lateral left calf. We have been using Hydrofera Blue with a contact layer. 4/1; no major change in these wounds areas. Left medial and more recently left lateral calf. I tried Iodoflex last week  to aid in debridement she did not tolerate this. She stated her pain was terrible all week. She took the top layer of the 4 layer compression off. 4/8; the patient actually looks somewhat better in terms of her more prominent left lateral calf wound. There is some healthy looking tissue here. She is still complaining of a lot of discomfort. 4/15; patient in a lot of pain secondary to sciatica. She is on a prednisone taper prescribed by her primary physician. She has the 2 areas one on the left medial and more recently a smaller area on the left lateral calf. Both of these just above the malleoli 4/22; her back pain is better but she still states she is very uncomfortable and now feels she is intolerant to the [employer]. No real change in the wounds we have been using Sorbact. She has been previously intolerant to Iodoflex. There is not a lot of option about what we can use to debride this wound under compression that she no doubt needs. sHe states Ultram no longer works for her pain 4/29; no major change in the wounds slightly increased depth. Surface on the original medial wound perhaps somewhat improved however the more recent area on the lateral left ankle is 100% covered in very adherent debris we have been using Sorbact. She tolerates 4 layer compression well and her edema control is a lot better. She has not had to come in for a nurse check 5/6; no major change in the condition of the wounds. She did consent to debridement today which was  done with some difficulty. Continuing Sorbact. She did not tolerate Iodoflex. She was in for a check of her compression the day after we wrapped her last week this was adjusted but nothing much was found 5/13; no major change in the condition or area of the wounds. I was able to get a fairly aggressive debridement done on the lateral left leg wound. Even using Sorbact under compression. She came back in on Friday to have the wrap changed. She says she felt uncomfortable on the lateral aspect of her ankle. She has a long history of chronic venous insufficiency including previous ablation surgery on this side. 5/20-Patient returns for wounds on left leg with both wounds covered in slough, with the lateral leg wound larger in size, she has been in 3 layer compression and felt more comfortable, she describes pain in ankle, in leg and pins and needles in foot, [name], [name] ([id number]) and is about to try Pamelor for this 6/3; wounds on the left lateral and left medial leg. The area medially which is the most recent of the 2 seems to have had the largest increase in dimensions. We have been using Sorbac to try and debride the surface. She has been to see orthopedics they apparently did a plain x-ray that was indeterminant. Diagnosed her with neuropathy and they have ordered an MRI to determine if there is underlying osteomyelitis. This was not high on my thought list but I suppose it is prudent. We have advised her to make an appointment with vein and vascular in [place]. She has a history of a left greater saphenous and accessory vein ablations I wonder if there is anything else that can be done from a surgical point of view to help in these difficult refractory wounds. We have previously healed this wound on one occasion but it keeps on reopening [medial side] 6/10; deep tissue culture I did last week I think on the  left medial wound showed both moderate E. coli and moderate staph aureus  [MSSA]. She is going to require antibiotics and I have chosen Augmentin. We have been using Sorbact and we have made better looking wound surface on both sides but certainly no improvement in wound area. She was back in last Friday apparently for a dressing changes the wrap was hurting her outer left ankle. She has not managed to get a hold of vein and vascular in [place]. We are going to have to make her that appointment 6/17; patient is tolerating the Augmentin. She had an MRI that I think was ordered by orthopedic surgeon this did not show osteomyelitis or an abscess did suggest cellulitis. We have been using Sorbact to the lateral and medial ankles. We have been trying to arrange a follow-up appointment with vein and vascular in [place] or did her original ablations. We apparently an area sent the request to vein and vascular in [facility] 6/24; patient has completed the Augmentin. We do not yet have a vein and vascular appointment in [place]. I am not sure what the issue is here we have asked her to call tomorrow. We are using Sorbact. Making some improvements and especially the medial wound. Both surfaces however look better medial and lateral. 7/1; the patient has been in contact with vein and vascular in [place] but has not yet received an appointment. Using Sorbact we have gradually improve the wound surface with no improvement in surface area. She is approved for Apligraf but the wound surface still is not completely viable. She has not had to come in for a dressing change 7/8; the patient has an appointment with vein and vascular on 7/31 which is a Friday afternoon. She is concerned about getting back here for [place] to dress her wounds. I think it is important to have them goal for her venous reflux/history of ablations etc. to see if anything else can be done. She apparently tested positive for 1 of the blood tests with regards to lupus and saw a rheumatologist. He has raised  the issue of vasculitis again. I have had this thought in the past however the evidence seems overwhelming that this is a venous reflux etiology. If the rheumatologist tells me there is clinical and laboratory investigation is positive for lupus I will rethink this. 7/15; the patient's wound surfaces are quite a bit better. The medial area which was her original wound now has no depth although the lateral wound which was the more recent area actually appears larger. Both with viable surfaces which is indeed better. Using Sorbact. I wanted to use Apligraf on her however there is the issue of the vein and vascular appointment on 7/31 at 2:00 in the afternoon which would not allow her to get back to be rewrapped and they would no doubt remove the graft 7/22; the patient's wound surfaces have moderate amount of debris although generally look better. The lateral one is larger with 2 small satellite areas superiorly. We are waiting for her vein and vascular appointment on 7/31. She has been approved for Apligraf which I would like to use after th 7/29; wound surfaces have improved no debridement is required we have been using Sorbact. She sees vein and vascular on Friday with this so question of whether anything can be done to lessen the likelihood of recurrence and/or speed the healing of these areas. She is already had previous ablations. She no doubt has severe venous hypertension 8/5-Patient returns at 1 week, she  was in [employer] for 3 days by her podiatrist, we have been using so backed to the wound, she has increased pain in both the wounds on the left lower leg especially the more distal one on the lateral aspect 8/12-Patient returns at 1 week and she is agreeable to having debridement in both wounds on her left leg today. We have been using Sorbact, and vascular studies were reviewed at last visit 8/19; the patient arrives with her wounds fairly clean and no debridement is required. We have used  Sorbact which is really done a nice job in cleaning up these very difficult wound surfaces. The patient saw Dr. Donzetta Matters of vascular surgery on 7/31. He did not feel that there was an arterial component. He felt that her treated greater saphenous vein is adequately addressed and that the small saphenous vein did not appear to be involved significantly. She was also noted to have deep venous reflux which is not treatable. Dr. Donzetta Matters mentioned the possibility of a central obstructive component leading to reflux and he offered her central venography. She wanted to discuss this or think about it. I have urged her to go ahead with this. She has had recurrent difficult wounds in these areas which do heal but after months in the clinic. If there is anything that can be done to reduce the likelihood of this I think it is worth it. 9/2 she is still working towards getting follow-up with Dr. Donzetta Matters to schedule her CT. Things are quite a bit worse venography. I put Apligraf on 2 weeks ago on both wounds on the medial and lateral part of her left lower leg. She arrives in clinic today with 3 superficial additional wounds above the area laterally and one below the wound medially. She describes a lot of discomfort. I think these are probably wrapped injuries. Does not look like she has cellulitis. 07/20/2019 on evaluation today patient appears to be doing somewhat poorly in regard to her lower extremity ulcers. She in fact showed signs of erythema in fact we may even be dealing with an infection at this time. Unfortunately I am unsure if this CHAELI, [name] is just infection or if indeed there may be some allergic reaction that occurred as a result of the Apligraf application. With that being said that would be unusual but nonetheless not impossible in this patient is one who is unfortunately allergic to quite a bit. Currently we have been using the Sorbact which seems to do as well as anything for her. I do  think we may want to obtain a culture today to see if there is anything showing up there that may need to be addressed. 9/16; noted that last week the wounds look worse in 1 week follow-up of the Apligraf. Using Sorbact as of 2 days ago. She arrives with copious amounts of drainage and new skin breakdown on the back of the left calf. The wounds arm more substantial bilaterally. There is a fair amount of swelling in the left calf no overt DVT there is edema present I think in the left greater than right thigh. She is supposed to go on 9/28 for CT venography. The wounds on the medial and lateral calf are worse and she has new skin breakdown posteriorly at least new for me. This is almost developing into a circumferential wound area The Apligraf was taken off last week which I agree with things are not going in the right direction a culture was done we  do not have that back yet. She is on Augmentin that she started 2 days ago 9/23; dressing was changed by her nurses on Monday. In general there is no improvement in the wound areas although the area looks less angry than last week. She did get Augmentin for MSSA cultured on the 14th. She still appears to have too much swelling in the left leg even with 3 layer compression Electronic Signature(s) Signed: 07/29/2019 5:27:47 PM By: Linton Ham MD Entered By: Linton Ham on 07/29/2019 13:19:00 Headlee, [name] -------------------------------------------------------------------------------- Physical Exam Details Patient Name: KRISLEY, HEWGLEY. Date of Service: 07/29/2019 12:45 PM Medical Record Number: [id number] Patient Account Number: [id number] Date of Birth/Sex: 1946/07/19 (73 y.o. F) Treating RN: Cornell Barman Primary Care Provider: [name] Other Clinician: Referring Provider: [name] Treating Provider/Extender: Tito Dine in Treatment: 6 Constitutional Patient is hypertensive.. Pulse regular and  within target range for patient.Marland Kitchen Respirations regular, non-labored and within target range.. Temperature is normal and within the target range for the patient.Marland Kitchen appears in no distress. Eyes Conjunctivae clear. No discharge. Respiratory Respiratory effort is easy and symmetric bilaterally. Rate is normal at rest and on room air.. Cardiovascular Pedal pulses palpable on the left. Integumentary (Hair, Skin) There is no erythema around the wound. Psychiatric No evidence of depression, anxiety, or agitation. Calm, cooperative, and communicative. Appropriate interactions and affect.. Notes Wound exam; there is 2 original wounds one on the left medial one on the left lateral. There is still more extensive skin breakdown above the area on the left lateral and also posteriorly. I think any evidence of cellulitis is better on the current antibiotic. She has edema in the left mid calf extending into the thigh. I do not believe there is a DVT Electronic Signature(s) Signed: 07/29/2019 5:27:47 PM By: Linton Ham MD Entered By: Linton Ham on 07/29/2019 13:20:22 OLAJUMOKE, LOBERG ([id number]) -------------------------------------------------------------------------------- Physician Orders Details Patient Name: ANIQUE, HICKEL. Date of Service: 07/29/2019 12:45 PM Medical Record Number: [id number] Patient Account Number: [id number] Date of Birth/Sex: 1946/04/06 (73 y.o. F) Treating RN: Cornell Barman Primary Care Provider: [name] Other Clinician: Referring Provider: [name] Treating Provider/Extender: Tito Dine in Treatment: 72 Verbal / Phone Orders: No Diagnosis Coding Wound Cleansing Wound #5 Left,Medial Lower Leg o Clean wound with Normal Saline. o May Shower, gently pat wound dry prior to applying new dressing. Wound #6 Left,Lateral Lower Leg o Clean wound with Normal Saline. o May Shower, gently pat wound dry prior to applying new dressing. Wound  #9 Left,Lateral,Posterior Lower Leg o Clean wound with Normal Saline. o May Shower, gently pat wound dry prior to applying new dressing. Anesthetic (add to Medication List) Wound #5 Left,Medial Lower Leg o Topical Lidocaine 4% cream applied to wound bed prior to debridement (In Clinic Only). o Benzocaine Topical Anesthetic Spray applied to wound bed prior to debridement (In Clinic Only). Wound #6 Left,Lateral Lower Leg o Topical Lidocaine 4% cream applied to wound bed prior to debridement (In Clinic Only). o Benzocaine Topical Anesthetic Spray applied to wound bed prior to debridement (In Clinic Only). Wound #9 Left,Lateral,Posterior Lower Leg o Topical Lidocaine 4% cream applied to wound bed prior to debridement (In Clinic Only). o Benzocaine Topical Anesthetic Spray applied to wound bed prior to debridement (In Clinic Only). Skin Barriers/Peri-Wound Care Wound #5 Left,Medial Lower Leg o Barrier cream - Zinc oxide to periwound area Wound #6 Left,Lateral Lower Leg o Barrier cream - Zinc oxide to periwound area Wound #9  Left,Lateral,Posterior Lower Leg o Barrier cream - Zinc oxide to periwound area Primary Wound Dressing Wound #5 Left,Medial Lower Leg o Silver Alginate Wound #6 Left,Lateral Lower Leg o Silver Alginate Wound #9 Left,Lateral,Posterior Lower Leg ALYSANDRA, LOSI ([id number]) o Silver Alginate Secondary Dressing Wound #5 Left,Medial Lower Leg o ABD pad o Drawtex Wound #6 Left,Lateral Lower Leg o ABD pad o Drawtex Wound #9 Left,Lateral,Posterior Lower Leg o ABD pad o Drawtex Dressing Change Frequency Wound #5 Left,Medial Lower Leg o Change dressing every week Wound #6 Left,Lateral Lower Leg o Change dressing every week Wound #9 Left,Lateral,Posterior Lower Leg o Change dressing every week Follow-up Appointments Wound #5 Left,Medial Lower Leg o Return Appointment in 1 week. o Nurse Visit as needed - Tuesday for  re-wrap Wound #6 Left,Lateral Lower Leg o Return Appointment in 1 week. o Nurse Visit as needed - Tuesday for re-wrap Wound #9 Left,Lateral,Posterior Lower Leg o Return Appointment in 1 week. o Nurse Visit as needed - Tuesday for re-wrap Edema Control Wound #5 Left,Medial Lower Leg o 4-Layer Compression System - Left Lower Extremity. Wound #6 Left,Lateral Lower Leg o 4-Layer Compression System - Left Lower Extremity. Wound #9 Left,Lateral,Posterior Lower Leg o 4-Layer Compression System - Left Lower Extremity. Off-Loading Wound #5 Left,Medial Lower Leg o Other: - Elevate legs as needed Wound #6 Left,Lateral Lower Leg o Other: - Elevate legs as needed ROTEM, LIJEWSKI ([id number]) Wound #9 Left,Lateral,Posterior Lower Leg o Other: - Elevate legs as needed Electronic Signature(s) Signed: 07/29/2019 5:27:47 PM By: Linton Ham MD Signed: 07/29/2019 5:53:54 PM By: [name], [name], RN, CWS, [name], [name] Entered By: [name], [name], RN, CWS, Kim on 07/29/2019 13:14:52 Mclees, [name] -------------------------------------------------------------------------------- Problem List Details Patient Name: MURJANI, ELMAN. Date of Service: 07/29/2019 12:45 PM Medical Record Number: [id number] Patient Account Number: [id number] Date of Birth/Sex: May 18, 1946 (73 y.o. F) Treating RN: Cornell Barman Primary Care Provider: [name] Other Clinician: Referring Provider: [name] Treating Provider/Extender: Tito Dine in Treatment: 33 Active Problems ICD-10 Evaluated Encounter Code Description Active Date Today Diagnosis L97.221 Non-pressure chronic ulcer of left calf limited to breakdown of 01/07/2019 No Yes skin I87.321 Chronic venous hypertension (idiopathic) with inflammation of 12/10/2018 No Yes right lower extremity I89.0 Lymphedema, not elsewhere classified 12/10/2018 No Yes Inactive Problems Resolved Problems Electronic Signature(s) Signed:  07/29/2019 5:27:47 PM By: Linton Ham MD Entered By: Linton Ham on 07/29/2019 13:16:27 Zimny, [name] -------------------------------------------------------------------------------- Progress Note Details Patient Name: CAMYAH, DERDEN. Date of Service: 07/29/2019 12:45 PM Medical Record Number: [id number] Patient Account Number: [id number] Date of Birth/Sex: [birth year] (73 y.o. F) Treating RN: Cornell Barman Primary Care Provider: [name] Other Clinician: Referring Provider: [name] Treating Provider/Extender: Tito Dine in Treatment: 33 Subjective History of Present Illness (HPI) Pleasant 73 year old with history of chronic venous insufficiency. No diabetes or peripheral vascular disease. Left ABI 1.29. Questionable history of left lower extremity DVT. She developed a recurrent ulceration on her left lateral calf in December 2015, which she attributes to poor diet and subsequent lower extremity edema. She underwent endovenous laser ablation of her left greater saphenous vein in 2010. She underwent laser ablation of accessory branch of left GSV in April 2016 by Dr. Kellie Simmering at [facility]. She was previously wearing Unna boots, which she tolerated well. Tolerating 2 layer compression and cadexomer iodine. She returns to clinic for follow-up and is without new complaints. She denies any significant pain at this time. She reports persistent pain with pressure. No  claudication or ischemic rest pain. No fever or chills. No drainage. READMISSION 11/13/16; this is a 73 year old woman who is not a diabetic. She is here for a review of a painful area on her left medial lower extremity. I note that she was seen here previously last year for wound I believe to be in the same area. At that time she had undergone previously a left greater saphenous vein ablation by Dr. Kellie Simmering and she had a ablation of the anterior accessory branch of the left greater  saphenous vein in March 2016. Seeing that the wound actually closed over. In reviewing the history with her today the ulcer in this area has been recurrent. She describes a biopsy of this area in 2009 that only showed stasis physiology. 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 Do not see that she has ever had arterial studies in the left leg. She tells me she has a follow-up with Dr. Kellie Simmering in roughly 10 days In any case she developed the reopening of this area roughly a month ago. On the background of this she describes rapidly increasing edema which has responded to Lasix 40 mg and metolazone 2.5 mg as well as the patient's lymph massage. She has been told she has both venous insufficiency and lymphedema but she cannot tolerate compression stockings 11/28/16; the patient saw Dr. Kellie Simmering recently. Per the patient he did arterial Dopplers in the office that did not show evidence of arterial insufficiency, per the patient he stated "treat this like an ordinary venous ulcer". She also saw her dermatologist Dr. Ronnald Ramp who felt that this was more of a vascular ulcer. In general things are improving although she arrives today with increasing bilateral lower extremity edema with weeping a deeper fluid through the wound on the left medial leg compatible with some degree of lymphedema 12/04/16; the patient's wound is fully epithelialized but I don't think fully healed. We will do another week of depression with Promogran and TCA however I suspect we'll be able to discharge her next week. This is a very unusual-looking wound which was initially a figure-of-eight type wound lying on its side surrounded by petechial like hemorrhage. She has had venous ablation on this side. She apparently does  not have an arterial issue per Dr. Kellie Simmering. She saw her dermatologist thought it was "vascular". Patient is definitely going to need ongoing compression and I talked about this with her today she will go to elastic therapy after she leaves here next week 12/11/16; the patient's wound is not completely closed today. She has surrounding scar tissue and in further discussion with the patient it would appear that she had ulcers in this area in 2009 for a prolonged period of time ultimately requiring a punch biopsy of this area that only showed venous insufficiency. I did not previously pickup on this part of the history from the patient. 12/18/16; the patient's wound is completely epithelialized. There is no open area here. She has significant bilateral venous insufficiency with secondary lymphedema to a mild-to-moderate degree she does not have compression stockings.. She did not say anything to me when I was in the room, she told our intake nurse that she was still having pain in this area. This isn't unusual recurrent small open area. She is  going to go to elastic therapy to obtain compression stockings. 12/25/16; the patient's wound is fully epithelialized. There is no open area here. The patient describes some continued episodic discomfort in this area medial left calf. However everything looks fine and healed here. She is been to elastic therapy and AZILE, ANTONOVICH ([id number]) caught herself 15-20 mmHg stockings, they apparently were having trouble getting 20-30 mm stockings in her size 01/22/17; this is a patient we discharged from the clinic a month ago. She has a recurrent open wound on her medial left calf. She had 73 mm support stockings. I told her I thought she needed 20-30 mm compression stockings. She tells me that she has been ill with hospitalization secondary to asthma and is been found to have severe hypokalemia likely secondary to a combination of Lasix and metolazone. This morning she  noted blistering and leaking fluid on the posterior part of her left leg. She called our intake nurse urgently and we was saw her this afternoon. She has not had any real discomfort here. I don't know that she's been wearing any stockings on this leg for at least 2-3 days. ABIs in this clinic were 1.21 on the right and 1.3 on the left. She is previously seen vascular surgery who does not think that there is a peripheral arterial issue. 01/30/17; Patient arrives with no open wound on the left leg. She has been to elastic therapy and obtained 20-22mmhg below knee stockings and she has one on the right leg today. READMISSION 02/19/18; this Prada is a now 73 year old patient we've had in this clinic perhaps 3 times before. I had last looked at her from January 07 December 2016 with an area on the medial left leg. We discharged her on 12/25/16 however she had to be readmitted on 01/22/17 with a recurrence. I have in my notes that we discharged her on 20-30 mm stockings although she tells me she was only wearing support hose because she cannot get stockings on predominantly related to her cervical spine surgery/issues. She has had previous ablations done by vein and vascular in [place] including a great saphenous vein ablation on the left with an anterior accessory branch ablation I think both of these were in 2016. On one of the previous visit she had a biopsy noted 2009 that was negative. She is not felt to have an arterial issue. She is not a diabetic. She does have a history of obstructive sleep apnea hypertension asthma as well as chronic venous insufficiency and lymphedema. On this occasion she noted 2 dry scaly patch on her left leg. She tried to put lotion on this it didn't really help. There were 2 open areas.the patient has been seeing her primary physician from 02/05/18 through 02/14/18. She had Unna boots applied. The superior wound now on the lateral left leg has closed but she's had one wound  that remains open on the lateral left leg. This is not the same spot as we dealt with in 2018. ABIs in this clinic were 1.3 bilaterally 02/26/18; patient has a small wound on the left lateral calf. Dimensions are down. She has chronic venous insufficiency and lymphedema. 03/05/18; small open area on the left lateral calf. Dimensions are down. Tightly adherent necrotic debris over the surface of the wound which was difficult to remove. Also the dressing [over collagen] stuck to the wound surface. This was removed with some difficulty as well. Change the primary dressing to Hydrofera Blue ready 03/12/18; small open area on  the left lateral calf. Comes in with tightly adherent surface eschar as well as some adherent Hydrofera Blue. 03/19/18; open area on the left lateral calf. Again adherent surface eschar as well as some adherent Hydrofera Blue nonviable subcutaneous tissue. She complained of pain all week even with the reduction from 4-3 layer compression I put on last week. Also she had an increase in her ankle and calf measurements probably related to the same thing. 03/26/18; open area on the left lateral calf. A very small open area remains here. We used silver alginate starting last week as the Hydrofera Blue seem to stick to the wound bed. In using 4-layer compression 04/02/18; the open area in the left lateral calf at some adherent slough which I removed there is no open area here. We are able to transition her into her own compression stocking. Truthfully I think this is probably his support hose. However this does not maintain skin integrity will be limited. She cannot put over the toe compression stockings on because of neck problems hand problems etc. She is allergic to the lining layer of juxta lites. We might be forced to use extremitease stocking should this fail READMIT 11/24/2018 Patient is now a 73 year old woman who is not a diabetic. She has been in this clinic on at least 3 previous  occasions largely with recurrent wounds on her left leg secondary to chronic venous insufficiency with secondary lymphedema. Her situation is complicated by inability to get stockings on and an allergy to neoprene which is apparently a component and at least juxta lites and other stockings. As a result she really has not been wearing any stockings on her legs. She tells [place] that roughly 2 or 3 weeks ago she started noticing a stinging sensation just above her ankle on the left medial aspect. She has been diagnosed with pseudogout and she wondered whether this was what she was experiencing. She tried to dress this with something she bought at the store however subsequently it pulled skin off and now she has an open wound that is not improving. She has been using Vaseline gauze with a cover bandage. She saw her primary doctor last week who put an Haematologist on her. ABIs in this clinic was 1.03 on the left [name], [name]. ([id number]) 2/12; the area is on the left medial ankle. Odd-looking wound with what looks to be surface epithelialization but a multitude of small petechial openings. This clearly not closed yet. We have been using silver alginate under 3 layer compression with TCA 2/19; the wound area did not look quite as good this week. Necrotic debris over the majority of the wound surface which required debridement. She continues to have a multitude of what looked to be small petechial openings. She reminds [place] that she had a biopsy on this initially during her first outbreak in 2015 in [place] dermatology. She expresses concern about this being a possible melanoma. 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 I assured her that this does not look anything like melanoma. Except for the petechial reaction it does look like a venous insufficiency area and she certainly has evidence of this on both sides 2/26;  a difficult area on the left medial ankle. The patient clearly has chronic venous hypertension with some degree of lymphedema. The odd thing about the area is the small petechial hemorrhages. I am not really sure how to explain this. This  was present last time and this is not a compression injury. We have been using Hydrofera Blue which I changed to last week 3/4; still using Hydrofera Blue. Aggressive debridement today. She does not have known arterial issues. She has seen Dr. Kellie Simmering at [facility] vein [facility] vascular and and has an ablation on the left. [Anterior accessory branch of the greater saphenous]. From what I remember they did not feel she had an arterial issue. The patient has had this area biopsied in 2009 at [facility] and by her recollection they said this was "stasis". She is also follow-up with dermatology locally who thought that this was more of a vascular issue 3/11; using Hydrofera Blue. Aggressive debridement today. She does not have an arterial issue. We are using 3 layer compression although we may need to go to 4. The patient has been in for multiple changes to her wrap since I last saw her a week ago. She says that the area was leaking. I do not have too much more information on what was found 01/19/19 on evaluation today patient was actually being seen for a nurse visit when unfortunately she had the area on her left lateral lower extremity as well as weeping from the right lower extremity that became apparent. Therefore we did end up actually seeing her for a full visit with myself. She is having some pain at this site as well but fortunately nothing too significant at this point. No fevers, chills, nausea, or vomiting noted at this time. 3/18-Patient is back to the clinic with the left leg venous leg ulcer, the ulcer is larger in size, has a surface that is densely adherent with fibrinous tissue, the Hydrofera Blue was used but is densely adherent and there was  difficulty in removing it. The right lower extremity was also wrapped for weeping edema. Patient has a new area over the left lateral foot above the malleolus that is small and appears to have no debris with intact surrounding skin. Patient is on increased dose of Lasix also as a means to edema management 3/25; the patient has a nonhealing venous ulcer on the medial left leg and last week developed a smaller area on the lateral left calf. We have been using Hydrofera Blue with a contact layer. 4/1; no major change in these wounds areas. Left medial and more recently left lateral calf. I tried Iodoflex last week to aid in debridement she did not tolerate this. She stated her pain was terrible all week. She took the top layer of the 4 layer compression off. 4/8; the patient actually looks somewhat better in terms of her more prominent left lateral calf wound. There is some healthy looking tissue here. She is still complaining of a lot of discomfort. 4/15; patient in a lot of pain secondary to sciatica. She is on a prednisone taper prescribed by her primary physician. She has the 2 areas one on the left medial and more recently a smaller area on the left lateral calf. Both of these just above the malleoli 4/22; her back pain is better but she still states she is very uncomfortable and now feels she is intolerant to the [employer]. No real change in the wounds we have been using Sorbact. She has been previously intolerant to Iodoflex. There is not a lot of option about what we can use to debride this wound under compression that she no doubt needs. sHe states Ultram no longer works for her pain 4/29; no major change in  the wounds slightly increased depth. Surface on the original medial wound perhaps somewhat improved however the more recent area on the lateral left ankle is 100% covered in very adherent debris we have been using Sorbact. She tolerates 4 layer compression well and her edema control is  a lot better. She has not had to come in for a nurse check 5/6; no major change in the condition of the wounds. She did consent to debridement today which was done with some difficulty. Continuing Sorbact. She did not tolerate Iodoflex. She was in for a check of her compression the day after we wrapped her last week this was adjusted but nothing much was found 5/13; no major change in the condition or area of the wounds. I was able to get a fairly aggressive debridement done on the lateral left leg wound. Even using Sorbact under compression. She came back in on Friday to have the wrap changed. She says she felt uncomfortable on the lateral aspect of her ankle. She has a long history of chronic venous insufficiency including previous ablation surgery on this side. 5/20-Patient returns for wounds on left leg with both wounds covered in slough, with the lateral leg wound larger in size, she has been in 3 layer compression and felt more comfortable, she describes pain in ankle, in leg and pins and needles in foot, and is about to try Pamelor for this 6/3; wounds on the left lateral and left medial leg. The area medially which is the most recent of the 2 seems to have had the largest increase in dimensions. We have been using Sorbac to try and debride the surface. She has been to see orthopedics [name], [name] they apparently did a plain x-ray that was indeterminant. Diagnosed her with neuropathy and they have ordered an MRI to determine if there is underlying osteomyelitis. This was not high on my thought list but I suppose it is prudent. We have advised her to make an appointment with vein and vascular in [place]. She has a history of a left greater saphenous and accessory vein ablations I wonder if there is anything else that can be done from a surgical point of view to help in these difficult refractory wounds. We have previously healed this wound on one occasion but it keeps on  reopening [medial side] 6/10; deep tissue culture I did last week I think on the left medial wound showed both moderate E. coli and moderate staph aureus [MSSA]. She is going to require antibiotics and I have chosen Augmentin. We have been using Sorbact and we have made better looking wound surface on both sides but certainly no improvement in wound area. She was back in last Friday apparently for a dressing changes the wrap was hurting her outer left ankle. She has not managed to get a hold of vein and vascular in [place]. We are going to have to make her that appointment 6/17; patient is tolerating the Augmentin. She had an MRI that I think was ordered by orthopedic surgeon this did not show osteomyelitis or an abscess did suggest cellulitis. We have been using Sorbact to the lateral and medial ankles. We have been trying to arrange a follow-up appointment with vein and vascular in [place] or did her original ablations. We apparently an area sent the request to vein and vascular in [facility] 6/24; patient has completed the Augmentin. We do not yet have a vein and vascular appointment in [place]. I am not sure what the issue  is here we have asked her to call tomorrow. We are using Sorbact. Making some improvements and especially the medial wound. Both surfaces however look better medial and lateral. 7/1; the patient has been in contact with vein and vascular in [place] but has not yet received an appointment. Using Sorbact we have gradually improve the wound surface with no improvement in surface area. She is approved for Apligraf but the wound surface still is not completely viable. She has not had to come in for a dressing change 7/8; the patient has an appointment with vein and vascular on 7/31 which is a Friday afternoon. She is concerned about getting back here for [place] to dress her wounds. I think it is important to have them goal for her venous reflux/history of ablations etc. to  see if anything else can be done. She apparently tested positive for 1 of the blood tests with regards to lupus and saw a rheumatologist. He has raised the issue of vasculitis again. I have had this thought in the past however the evidence seems overwhelming that this is a venous reflux etiology. If the rheumatologist tells me there is clinical and laboratory investigation is positive for lupus I will rethink this. 7/15; the patient's wound surfaces are quite a bit better. The medial area which was her original wound now has no depth although the lateral wound which was the more recent area actually appears larger. Both with viable surfaces which is indeed better. Using Sorbact. I wanted to use Apligraf on her however there is the issue of the vein and vascular appointment on 7/31 at 2:00 in the afternoon which would not allow her to get back to be rewrapped and they would no doubt remove the graft 7/22; the patient's wound surfaces have moderate amount of debris although generally look better. The lateral one is larger with 2 small satellite areas superiorly. We are waiting for her vein and vascular appointment on 7/31. She has been approved for Apligraf which I would like to use after th 7/29; wound surfaces have improved no debridement is required we have been using Sorbact. She sees vein and vascular on Friday with this so question of whether anything can be done to lessen the likelihood of recurrence and/or speed the healing of these areas. She is already had previous ablations. She no doubt has severe venous hypertension 8/5-Patient returns at 1 week, she was in [place] for 3 days by her podiatrist, we have been using so backed to the wound, she has increased pain in both the wounds on the left lower leg especially the more distal one on the lateral aspect 8/12-Patient returns at 1 week and she is agreeable to having debridement in both wounds on her left leg today. We have been using  Sorbact, and vascular studies were reviewed at last visit 8/19; the patient arrives with her wounds fairly clean and no debridement is required. We have used Sorbact which is really done a nice job in cleaning up these very difficult wound surfaces. The patient saw Dr. Donzetta Matters of vascular surgery on 7/31. He did not feel that there was an arterial component. He felt that her treated greater saphenous vein is adequately addressed and that the small saphenous vein did not appear to be involved significantly. She was also noted to have deep venous reflux which is not treatable. Dr. Donzetta Matters mentioned the possibility of a central obstructive component leading to reflux and he offered her central venography. She wanted to discuss this  or think about it. I have urged her to go ahead with this. She has had recurrent difficult wounds in these areas which do heal but after months in the clinic. If there is anything that can be done to reduce the likelihood of this I think it is worth it. 9/2 she is still working towards getting follow-up with Dr. Donzetta Matters to schedule her CT. Things are quite a bit worse venography. I put Apligraf on 2 weeks ago on both wounds on the medial and lateral part of her left lower leg. She arrives in clinic today with 3 superficial additional wounds above the area laterally and one below the wound medially. She describes a lot of discomfort. I think these are probably wrapped injuries. Does not look like she has cellulitis. 07/20/2019 on evaluation today patient appears to be doing somewhat poorly in regard to her lower extremity ulcers. She in fact showed signs of erythema in fact we may even be dealing with an infection at this time. Unfortunately I am unsure if this is just infection or if indeed there may be some allergic reaction that occurred as a result of the Apligraf application. With that being said that would be unusual but nonetheless not impossible in this patient is one who is  unfortunately allergic to quite a bit. Currently we have been using the Sorbact which seems to do as well as anything for her. I do think we may want to obtain [name], [name] a culture today to see if there is anything showing up there that may need to be addressed. 9/16; noted that last week the wounds look worse in 1 week follow-up of the Apligraf. Using Sorbact as of 2 days ago. She arrives with copious amounts of drainage and new skin breakdown on the back of the left calf. The wounds arm more substantial bilaterally. There is a fair amount of swelling in the left calf no overt DVT there is edema present I think in the left greater than right thigh. She is supposed to go on 9/28 for CT venography. The wounds on the medial and lateral calf are worse and she has new skin breakdown posteriorly at least new for me. This is almost developing into a circumferential wound area The Apligraf was taken off last week which I agree with things are not going in the right direction a culture was done we do not have that back yet. She is on Augmentin that she started 2 days ago 9/23; dressing was changed by her nurses on Monday. In general there is no improvement in the wound areas although the area looks less angry than last week. She did get Augmentin for MSSA cultured on the 14th. She still appears to have too much swelling in the left leg even with 3 layer compression Objective Constitutional Patient is hypertensive.. Pulse regular and within target range for patient.Marland Kitchen Respirations regular, non-labored and within target range.. Temperature is normal and within the target range for the patient.Marland Kitchen appears in no distress. Vitals Time Taken: 12:52 PM, Height: 63 in, Source: Stated, Weight: 224.7 lbs, Source: Stated, BMI: 39.8, Temperature: 98.6 F, Pulse: 79 bpm, Respiratory Rate: 18 breaths/min, Blood Pressure: 156/76 mmHg. Eyes Conjunctivae clear. No discharge. Respiratory Respiratory  effort is easy and symmetric bilaterally. Rate is normal at rest and on room air.. Cardiovascular Pedal pulses palpable on the left. Psychiatric No evidence of depression, anxiety, or agitation. Calm, cooperative, and communicative. Appropriate interactions and affect.. General Notes: Wound exam; there is  2 original wounds one on the left medial one on the left lateral. There is still more extensive skin breakdown above the area on the left lateral and also posteriorly. I think any evidence of cellulitis is better on the current antibiotic. She has edema in the left mid calf extending into the thigh. I do not believe there is a DVT Integumentary (Hair, Skin) There is no erythema around the wound. Wound #5 status is Open. Original cause of wound was Gradually Appeared. The wound is located on the Left,Medial Lower Leg. The wound measures 5.5cm length x 3cm width x 0.1cm depth; 12.959cm^2 area and 1.296cm^3 volume. There is Fat Layer (Subcutaneous Tissue) Exposed exposed. There is no tunneling or undermining noted. There is a large amount of serosanguineous drainage noted. There is medium (34-66%) red granulation within the wound bed. There is a medium (34- 66%) amount of necrotic tissue within the wound bed including Adherent Slough. [name], [name] Wound #6 status is Open. Original cause of wound was Gradually Appeared. The wound is located on the Left,Lateral Lower Leg. The wound measures 11cm length x 8.5cm width x 0.1cm depth; 73.435cm^2 area and 7.343cm^3 volume. Wound #7 status is Converted. Original cause of wound was Gradually Appeared. The wound is located on the Left,Proximal,Lateral Lower Leg. Wound #8 status is Converted. Original cause of wound was Gradually Appeared. The wound is located on the Left,Lateral Lower Leg. Wound #9 status is Open. Original cause of wound was Gradually Appeared. The wound is located on the Left,Lateral,Posterior Lower Leg. The wound  measures 4.5cm length x 4cm width x 0.1cm depth; 14.137cm^2 area and 1.414cm^3 volume. There is Fat Layer (Subcutaneous Tissue) Exposed exposed. There is no tunneling or undermining noted. There is a large amount of serosanguineous drainage noted. There is medium (34-66%) red granulation within the wound bed. There is a medium (34-66%) amount of necrotic tissue within the wound bed including Adherent Slough. Assessment Active Problems ICD-10 Non-pressure chronic ulcer of left calf limited to breakdown of skin Chronic venous hypertension (idiopathic) with inflammation of right lower extremity Lymphedema, not elsewhere classified Procedures Wound #5 Pre-procedure diagnosis of Wound #5 is a Lymphedema located on the Left,Medial Lower Leg . There was a Four Layer Compression Therapy Procedure with a pre-treatment ABI of 1 by Cornell Barman, RN. Post procedure Diagnosis Wound #5: Same as Pre-Procedure Plan Wound Cleansing: Wound #5 Left,Medial Lower Leg: Clean wound with Normal Saline. May Shower, gently pat wound dry prior to applying new dressing. Wound #6 Left,Lateral Lower Leg: Clean wound with Normal Saline. May Shower, gently pat wound dry prior to applying new dressing. Wound #9 Left,Lateral,Posterior Lower Leg: Clean wound with Normal Saline. May Shower, gently pat wound dry prior to applying new dressing. Anesthetic (add to Medication List): Wound #5 Left,Medial Lower Leg: [name], [name] Topical Lidocaine 4% cream applied to wound bed prior to debridement (In Clinic Only). Benzocaine Topical Anesthetic Spray applied to wound bed prior to debridement (In Clinic Only). Wound #6 Left,Lateral Lower Leg: Topical Lidocaine 4% cream applied to wound bed prior to debridement (In Clinic Only). Benzocaine Topical Anesthetic Spray applied to wound bed prior to debridement (In Clinic Only). Wound #9 Left,Lateral,Posterior Lower Leg: Topical Lidocaine 4% cream applied to wound bed  prior to debridement (In Clinic Only). Benzocaine Topical Anesthetic Spray applied to wound bed prior to debridement (In Clinic Only). Skin Barriers/Peri-Wound Care: Wound #5 Left,Medial Lower Leg: Barrier cream - Zinc oxide to periwound area Wound #6 Left,Lateral Lower Leg:  Barrier cream - Zinc oxide to periwound area Wound #9 Left,Lateral,Posterior Lower Leg: Barrier cream - Zinc oxide to periwound area Primary Wound Dressing: Wound #5 Left,Medial Lower Leg: Silver Alginate Wound #6 Left,Lateral Lower Leg: Silver Alginate Wound #9 Left,Lateral,Posterior Lower Leg: Silver Alginate Secondary Dressing: Wound #5 Left,Medial Lower Leg: ABD pad Drawtex Wound #6 Left,Lateral Lower Leg: ABD pad Drawtex Wound #9 Left,Lateral,Posterior Lower Leg: ABD pad Drawtex Dressing Change Frequency: Wound #5 Left,Medial Lower Leg: Change dressing every week Wound #6 Left,Lateral Lower Leg: Change dressing every week Wound #9 Left,Lateral,Posterior Lower Leg: Change dressing every week Follow-up Appointments: Wound #5 Left,Medial Lower Leg: Return Appointment in 1 week. Nurse Visit as needed - Tuesday for re-wrap Wound #6 Left,Lateral Lower Leg: Return Appointment in 1 week. Nurse Visit as needed - Tuesday for re-wrap Wound #9 Left,Lateral,Posterior Lower Leg: Return Appointment in 1 week. Nurse Visit as needed - Tuesday for re-wrap Edema Control: Wound #5 Left,Medial Lower Leg: 4-Layer Compression System - Left Lower Extremity. Wound #6 Left,Lateral Lower Leg: 4-Layer Compression System - Left Lower Extremity. Wound #9 Left,Lateral,Posterior Lower Leg: 4-Layer Compression System - Left Lower Extremity. Off-Loading: Wound #5 Left,Medial Lower Leg: Other: - Elevate legs as needed Wound #6 Left,Lateral Lower Leg: [name], [name]. ([id number]) Other: - Elevate legs as needed Wound #9 Left,Lateral,Posterior Lower Leg: Other: - Elevate legs as needed 1. I am continue with silver  alginate secondary absorptive/ABDs and I have increased her from 3-4 layer compression. 2. CT venogram on 9/28 I think this is important. Her left thigh is larger than the right but no pitting edema. 3. She has 2 more days of antibiotics I think that should suffice Electronic Signature(s) Signed: 07/29/2019 5:27:47 PM By: Linton Ham MD Entered By: Linton Ham on 07/29/2019 13:21:30 Martelle, [name] -------------------------------------------------------------------------------- SuperBill Details Patient Name: LAURIEANNE, CAVALCANTE. Date of Service: 07/29/2019 Medical Record Number: [id number] Patient Account Number: [id number] Date of Birth/Sex: 02-14-1946 (73 y.o. F) Treating RN: Cornell Barman Primary Care Provider: [name] Other Clinician: Referring Provider: [name] Treating Provider/Extender: Tito Dine in Treatment: 33 Diagnosis Coding ICD-10 Codes Code Description [id number] Non-pressure chronic ulcer of left calf limited to breakdown of skin I87.321 Chronic venous hypertension (idiopathic) with inflammation of right lower extremity I89.0 Lymphedema, not elsewhere classified Facility Procedures CPT4 Code: [id number] Description: (Facility Use Only) [phone] - [facility] LT LEG Modifier: Quantity: 1 Physician Procedures CPT4 Code Description: [id number] - WC PHYS LEVEL 3 - EST PT ICD-10 Diagnosis Description L97.221 Non-pressure chronic ulcer of left calf limited to breakdown I87.321 Chronic venous hypertension (idiopathic) with inflammation of I89.0 Lymphedema, not  elsewhere classified Modifier: of skin right lower extr Quantity: 1 emity Electronic Signature(s) Signed: 07/29/2019 5:27:47 PM By: Linton Ham MD Entered By: Linton Ham on 07/29/2019 13:21:56

## 2019-07-30 NOTE — Progress Notes (Signed)
SHONICE, CURLING ([id number]) Visit Report for 07/29/2019 Arrival Information Details Patient Name: [name], [name]. Date of Service: 07/29/2019 12:45 PM Medical Record Number: [id number] Patient Account Number: [id number] Date of Birth/Sex: Mar 12, 1946 (73 y.o. F) Treating RN: Harold Barban Primary Care Doreen Garretson: [name] Other Clinician: Referring Tamarcus Condie: [name] Treating Maleeah Crossman/Extender: Tito Dine in Treatment: 60 Visit Information History Since Last Visit Added or deleted any medications: No Patient Arrived: Ambulatory Any new allergies or adverse reactions: No Arrival Time: 12:50 Had a fall or experienced change in No Accompanied By: self activities of daily living that may affect Transfer Assistance: None risk of falls: Patient Identification Verified: Yes Signs or symptoms of abuse/neglect since last visito No Secondary Verification Process Yes Hospitalized since last visit: No Completed: Has Dressing in Place as Prescribed: Yes Patient Requires Transmission-Based No Has Compression in Place as Prescribed: Yes Precautions: Pain Present Now: No Patient Has Alerts: Yes Patient Alerts: Patient on Blood Thinner aspirin 81 Electronic Signature(s) Signed: 07/29/2019 4:54:02 PM By: Harold Barban Entered By: Harold Barban on 07/29/2019 12:50:41 Siddique, [name] -------------------------------------------------------------------------------- Compression Therapy Details Patient Name: Amber, [name]. Date of Service: 07/29/2019 12:45 PM Medical Record Number: [id number] Patient Account Number: [id number] Date of Birth/Sex: 22-Nov-1945 (73 y.o. F) Treating RN: Cornell Barman Primary Care Mearl Olver: [name] Other Clinician: Referring Dymond Spreen: [name] Treating Demarie Uhlig/Extender: Tito Dine in Treatment: 33 Compression Therapy Performed for Wound Assessment: Wound #5 Left,Medial Lower Leg Performed By:  Clinician Cornell Barman, RN Compression Type: Four Layer Pre Treatment ABI: 1 Post Procedure Diagnosis Same as Pre-procedure Electronic Signature(s) Signed: 07/29/2019 5:53:54 PM By: [name], [name], RN, CWS, [name], [name] Entered By: [name], [name], RN, CWS, Kim on 07/29/2019 13:15:33 Barreira, [name] -------------------------------------------------------------------------------- Encounter Discharge Information Details Patient Name: Amber, [name]. Date of Service: 07/29/2019 12:45 PM Medical Record Number: [id number] Patient Account Number: [id number] Date of Birth/Sex: [birth year] (73 y.o. F) Treating RN: Cornell Barman Primary Care Langston Tuberville: [name] Other Clinician: Referring Drake Landing: [name] Treating Naeemah Jasmer/Extender: Tito Dine in Treatment: 53 Encounter Discharge Information Items Discharge Condition: Stable Ambulatory Status: Ambulatory Discharge Destination: Home Transportation: Private Auto Accompanied By: self Schedule Follow-up Appointment: Yes Clinical Summary of Care: Electronic Signature(s) Signed: 07/29/2019 5:53:54 PM By: [name], [name], RN, CWS, [name], [name] Entered By: [name], [name], RN, CWS, Kim on 07/29/2019 13:17:15 [name] -------------------------------------------------------------------------------- Lower Extremity Assessment Details Patient Name: Amber, [name]. Date of Service: 07/29/2019 12:45 PM Medical Record Number: [id number] Patient Account Number: [id number] Date of Birth/Sex: October 29, 1946 (73 y.o. F) Treating RN: Harold Barban Primary Care Laketha Leopard: [name] Other Clinician: Referring Almedia Cordell: [name] Treating Mcadoo Muzquiz/Extender: [name] Dillon Weeks in Treatment: 33 Edema Assessment Assessed: [Left: No] [Right: No] Edema: [Left: N] [Right: o] Vascular Assessment Pulses: Dorsalis Pedis Palpable: [Left:Yes] Electronic Signature(s) Signed: 07/29/2019 4:54:02 PM By: Harold Barban Entered By: Harold Barban on 07/29/2019 13:05:08 Laroche, [name] -------------------------------------------------------------------------------- Multi Wound Chart Details Patient Name: Amber, [name]. Date of Service: 07/29/2019 12:45 PM Medical Record Number: [id number] Patient Account Number: [id number] Date of Birth/Sex: [birth year] (73 y.o. F) Treating RN: Cornell Barman Primary Care Jesaiah Fabiano: [name] Other Clinician: Referring Sharyl Panchal: [name] Treating Mayjor Ager/Extender: Tito Dine in Treatment: 33 Vital Signs Height(in): 63 Pulse(bpm): 79 Weight(lbs): 224.7 Blood Pressure(mmHg): 156/76 Body Mass Index(BMI): 40 Temperature(F): 98.6 Respiratory Rate 18 (breaths/min): Photos: [6:No Photos] [7:No Photos] Wound Location: Left Lower Leg - Medial Left, Distal, Lateral Lower Leg  Left, Proximal, Lateral Lower Leg Wounding Event: Gradually Appeared Gradually Appeared Gradually Appeared Primary Etiology: Lymphedema Venous Leg Ulcer Venous Leg Ulcer Comorbid History: Cataracts, Asthma, Sleep N/A N/A Apnea, Deep Vein Thrombosis, Hypertension, Peripheral Venous Disease, Osteoarthritis, Received Chemotherapy, Received Radiation Date Acquired: 11/19/2018 01/19/2019 07/08/2019 Weeks of Treatment: 33 27 3 Wound Status: Open Open Converted Measurements L x W x D 5.5x3x0.1 11x8.5x0.1 N/A (cm) Area (cm) : 12.959 73.435 N/A Volume (cm) : 1.296 7.343 N/A % Reduction in Area: -51.70% -33279.50% N/A % Reduction in Volume: -51.60% -33277.30% N/A Classification: Full Thickness Without Full Thickness Without Full Thickness Without Exposed Support Structures Exposed Support Structures Exposed Support Structures Exudate Amount: Large N/A N/A Exudate Type: Serosanguineous N/A N/A Exudate Color: red, brown N/A N/A Granulation Amount: Medium (34-66%) N/A N/A Granulation Quality: Red N/A N/A Necrotic Amount: Medium (34-66%) N/A N/A Exposed  Structures: N/A N/A FINESSE, DUROCHER ([id number]) Fat Layer (Subcutaneous Tissue) Exposed: Yes Fascia: No Tendon: No Muscle: No Joint: No Bone: No Procedures Performed: Compression Therapy N/A N/A Wound Number: 8 9 N/A Photos: No Photos N/A Wound Location: Left, Lateral Lower Leg Left Lower Leg - Lateral, N/A Posterior Wounding Event: Gradually Appeared Gradually Appeared N/A Primary Etiology: Venous Leg Ulcer Venous Leg Ulcer N/A Comorbid History: N/A Cataracts, Asthma, Sleep N/A Apnea, Deep Vein Thrombosis, Hypertension, Peripheral Venous Disease, Osteoarthritis, Received Chemotherapy, Received Radiation Date Acquired: 07/08/2019 07/08/2019 N/A Weeks of Treatment: 3 3 N/A Wound Status: Converted Open N/A Measurements L x W x D N/A 4.5x4x0.1 N/A (cm) Area (cm) : N/A 14.137 N/A Volume (cm) : N/A 1.414 N/A % Reduction in Area: N/A -2402.10% N/A % Reduction in Volume: N/A -2380.70% N/A Classification: Full Thickness Without Full Thickness Without N/A Exposed Support Structures Exposed Support Structures Exudate Amount: N/A Large N/A Exudate Type: N/A Serosanguineous N/A Exudate Color: N/A red, brown N/A Granulation Amount: N/A Medium (34-66%) N/A Granulation Quality: N/A Red N/A Necrotic Amount: N/A Medium (34-66%) N/A Exposed Structures: N/A Fat Layer (Subcutaneous N/A Tissue) Exposed: Yes Fascia: No Tendon: No Muscle: No Joint: No Bone: No Procedures Performed: N/A N/A N/A Treatment Notes KELLEA, POTTHOFF ([id number]) Electronic Signature(s) Signed: 07/29/2019 5:27:47 PM By: Linton Ham MD Entered By: Linton Ham on 07/29/2019 13:16:42 Probasco, [name] -------------------------------------------------------------------------------- Multi-Disciplinary Care Plan Details Patient Name: MARESHA, MORRICE. Date of Service: 07/29/2019 12:45 PM Medical Record Number: [id number] Patient Account Number: [id number] Date of Birth/Sex: 1946/03/26 (73 y.o.  F) Treating RN: Cornell Barman Primary Care Maija Biggers: [name] Other Clinician: Referring Suleika Donavan: [name] Treating Chike Farrington/Extender: Tito Dine in Treatment: 10 Active Inactive Orientation to the Wound Care Program Nursing Diagnoses: Knowledge deficit related to the wound healing center program Goals: Patient/caregiver will verbalize understanding of the [facility] Date Initiated: 12/10/2018 Target Resolution Date: 01/09/2019 Goal Status: Active Interventions: Provide education on orientation to the wound center Notes: Soft Tissue Infection Nursing Diagnoses: Impaired tissue integrity Goals: Patient's soft tissue infection will resolve Date Initiated: 12/10/2018 Target Resolution Date: 01/09/2019 Goal Status: Active Interventions: Assess signs and symptoms of infection every visit Notes: Venous Leg Ulcer Nursing Diagnoses: Actual venous Insuffiency (use after diagnosis is confirmed) Goals: Patient will maintain optimal edema control Date Initiated: 12/10/2018 Target Resolution Date: 01/09/2019 Goal Status: Active Interventions: Assess peripheral edema status every visit. ROWAN, LUEBBE ([id number]) Treatment Activities: Therapeutic compression applied : 12/10/2018 Notes: Wound/Skin Impairment Nursing Diagnoses: Impaired tissue integrity Goals: Patient/caregiver will verbalize understanding of skin care regimen Date Initiated: 12/10/2018 Target Resolution Date: 01/09/2019 Goal  Status: Active Interventions: Assess ulceration(s) every visit Treatment Activities: Topical wound management initiated : 12/10/2018 Notes: Electronic Signature(s) Signed: 07/29/2019 5:53:54 PM By: [name], [name], RN, CWS, [name], [name] Entered By: [name], [name], RN, CWS, Kim on 07/29/2019 13:12:36 KATRISHA, AMUSO ([id number]) -------------------------------------------------------------------------------- Pain Assessment Details Patient Name: GAVRIELLE, NEVES. Date of Service: 07/29/2019 12:45 PM Medical Record Number: [id number] Patient Account Number: [id number] Date of Birth/Sex: May 13, 1946 (73 y.o. F) Treating RN: Harold Barban Primary Care Lomax Poehler: [name] Other Clinician: Referring Sharlot Sturkey: [name] Treating Lexee Brashears/Extender: Tito Dine in Treatment: 33 Active Problems Location of Pain Severity and Description of Pain Patient Has Paino No Site Locations Pain Management and Medication Current Pain Management: Electronic Signature(s) Signed: 07/29/2019 4:54:02 PM By: Harold Barban Entered By: Harold Barban on 07/29/2019 12:50:50 [name] -------------------------------------------------------------------------------- Patient/Caregiver Education Details Patient Name: WILMINA, BRABENDER. Date of Service: 07/29/2019 12:45 PM Medical Record Number: [id number] Patient Account Number: [id number] Date of Birth/Gender: 03-01-1946 (73 y.o. F) Treating RN: Cornell Barman Primary Care Physician: [name] Other Clinician: Referring Physician: [name] Treating Physician/Extender: Tito Dine in Treatment: 61 Education Assessment Education Provided To: Patient Education Topics Provided Venous: Handouts: Controlling Swelling with Multilayered Compression Wraps Methods: Demonstration, Explain/Verbal Responses: State content correctly Wound/Skin Impairment: Handouts: Caring for Your Ulcer Methods: Demonstration, Explain/Verbal Responses: State content correctly Electronic Signature(s) Signed: 07/29/2019 5:53:54 PM By: [name], [name], RN, CWS, [name], [name] Entered By: [name], [name], RN, CWS, Kim on 07/29/2019 [place], [place]. ([id number]) -------------------------------------------------------------------------------- Wound Assessment Details Patient Name: ELVITA, ROBIE. Date of Service: 07/29/2019 12:45 PM Medical Record Number: [id number] Patient Account  Number: [id number] Date of Birth/Sex: July 11, 1946 (73 y.o. F) Treating RN: Harold Barban Primary Care Celestina Gironda: [name] Other Clinician: Referring Tonetta Napoles: [name] Treating Olman Yono/Extender: Tito Dine in Treatment: 33 Wound Status Wound Number: 5 Primary Lymphedema Etiology: Wound Location: Left Lower Leg - Medial Wound Open Wounding Event: Gradually Appeared Status: Date Acquired: 11/19/2018 Comorbid Cataracts, Asthma, Sleep Apnea, Deep Vein Weeks Of Treatment: 33 History: Thrombosis, Hypertension, Peripheral Venous Clustered Wound: No Disease, Osteoarthritis, Received Chemotherapy, Received Radiation Photos Wound Measurements Length: (cm) 5.5 Width: (cm) 3 Depth: (cm) 0.1 Area: (cm) 12.959 Volume: (cm) 1.296 % Reduction in Area: -51.7% % Reduction in Volume: -51.6% Tunneling: No Undermining: No Wound Description Full Thickness Without Exposed Support Fo Classification: Structures Sl Exudate Large Amount: Exudate Type: Serosanguineous Exudate Color: red, brown ul Odor After Cleansing: No ough/Fibrino Yes Wound Bed Granulation Amount: Medium (34-66%) Exposed Structure Granulation Quality: Red Fascia Exposed: No Necrotic Amount: Medium (34-66%) Fat Layer (Subcutaneous Tissue) Exposed: Yes Necrotic Quality: Adherent Slough Tendon Exposed: No Muscle Exposed: No Joint Exposed: No Bone Exposed: No Schlotzhauer, Kalijah J. ([id number]) Treatment Notes Wound #5 (Left, Medial Lower Leg) Notes Zinc, Silver Cell, absorbent dressing, 4L (L) Electronic Signature(s) Signed: 07/29/2019 4:54:02 PM By: Harold Barban Entered By: Harold Barban on 07/29/2019 13:04:13 Seguin, [name] -------------------------------------------------------------------------------- Wound Assessment Details Patient Name: UMAIZA, WISHON. Date of Service: 07/29/2019 12:45 PM Medical Record Number: [id number] Patient Account Number: [id number] Date of  Birth/Sex: 26-Mar-1946 (73 y.o. F) Treating RN: Cornell Barman Primary Care Felder Lebeda: [name] Other Clinician: Referring Recardo Linn: [name] Treating Jacquelynn Friend/Extender: Tito Dine in Treatment: 33 Wound Status Wound Number: 6 Primary Etiology: Venous Leg Ulcer Wound Location: Left, Distal, Lateral Lower Leg Wound Status: Open Wounding Event: Gradually Appeared Date Acquired: 01/19/2019 Weeks Of Treatment: 27 Clustered Wound: No Wound Measurements Length: (cm)  11 Width: (cm) 8.5 Depth: (cm) 0.1 Area: (cm) 73.435 Volume: (cm) 7.343 % Reduction in Area: -33279.5% % Reduction in Volume: -33277.3% Wound Description Full Thickness Without Exposed Support Classification: Structures Electronic Signature(s) Signed: 07/29/2019 5:53:54 PM By: [name], [name], RN, CWS, [name], [name] Entered By: [name], [name], RN, CWS, Kim on 07/29/2019 13:11:59 [name] -------------------------------------------------------------------------------- Wound Assessment Details Patient Name: KAPRIA, COXWELL. Date of Service: 07/29/2019 12:45 PM Medical Record Number: [id number] Patient Account Number: [id number] Date of Birth/Sex: October 02, 1946 (73 y.o. F) Treating RN: Cornell Barman Primary Care Prabhav Faulkenberry: [name] Other Clinician: Referring Lakisha Peyser: [name] Treating Dyanara Cozza/Extender: Tito Dine in Treatment: 33 Wound Status Wound Number: 7 Primary Etiology: Venous Leg Ulcer Wound Location: Left, Proximal, Lateral Lower Leg Wound Status: Converted Wounding Event: Gradually Appeared Date Acquired: 07/08/2019 Weeks Of Treatment: 3 Clustered Wound: No Wound Description Full Thickness Without Exposed Support Classification: Structures Electronic Signature(s) Signed: 07/29/2019 5:53:54 PM By: [name], [name], RN, CWS, [name], [name] Entered By: [name], [name], RN, CWS, Kim on 07/29/2019 13:09:24 [name]  ([id number]) -------------------------------------------------------------------------------- Wound Assessment Details Patient Name: KERSTIE, SONNER. Date of Service: 07/29/2019 12:45 PM Medical Record Number: [id number] Patient Account Number: [id number] Date of Birth/Sex: 04/25/1946 (73 y.o. F) Treating RN: Cornell Barman Primary Care Alawna Graybeal: [name] Other Clinician: Referring Glenard Keesling: [name] Treating Sareen Randon/Extender: Tito Dine in Treatment: 33 Wound Status Wound Number: 8 Primary Etiology: Venous Leg Ulcer Wound Location: Left, Lateral Lower Leg Wound Status: Converted Wounding Event: Gradually Appeared Date Acquired: 07/08/2019 Weeks Of Treatment: 3 Clustered Wound: No Wound Description Full Thickness Without Exposed Support Classification: Structures Electronic Signature(s) Signed: 07/29/2019 5:53:54 PM By: [name], [name], RN, CWS, [name], [name] Entered By: [name], [name], RN, CWS, Kim on 07/29/2019 13:09:25 [name] -------------------------------------------------------------------------------- Wound Assessment Details Patient Name: KHALIDAH, SEGRETI. Date of Service: 07/29/2019 12:45 PM Medical Record Number: [id number] Patient Account Number: [id number] Date of Birth/Sex: 1946-07-29 (73 y.o. F) Treating RN: Harold Barban Primary Care Deran Barro: [name] Other Clinician: Referring Antawn Sison: [name] Treating Orvis Stann/Extender: Tito Dine in Treatment: 33 Wound Status Wound Number: 9 Primary Venous Leg Ulcer Etiology: Wound Location: Left Lower Leg - Lateral, Posterior Wound Open Wounding Event: Gradually Appeared Status: Date Acquired: 07/08/2019 Comorbid Cataracts, Asthma, Sleep Apnea, Deep Vein Weeks Of Treatment: 3 History: Thrombosis, Hypertension, Peripheral Venous Clustered Wound: No Disease, Osteoarthritis, Received Chemotherapy, Received Radiation Photos Wound Measurements Length:  (cm) 4.5 Width: (cm) 4 Depth: (cm) 0.1 Area: (cm) 14.137 Volume: (cm) 1.414 % Reduction in Area: -2402.1% % Reduction in Volume: -2380.7% Tunneling: No Undermining: No Wound Description Full Thickness Without Exposed Support Classification: Structures Exudate Large Amount: Exudate Type: Serosanguineous Exudate Color: red, brown Foul Odor After Cleansing: No Slough/Fibrino Yes Wound Bed Granulation Amount: Medium (34-66%) Exposed Structure Granulation Quality: Red Fascia Exposed: No Necrotic Amount: Medium (34-66%) Fat Layer (Subcutaneous Tissue) Exposed: Yes Necrotic Quality: Adherent Slough Tendon Exposed: No Muscle Exposed: No Joint Exposed: No Bone Exposed: No Presley, Rosell J. ([id number]) Treatment Notes Wound #9 (Left, Lateral, Posterior Lower Leg) Notes Zinc, Silver Cell, absorbent dressing, 4L (L) Electronic Signature(s) Signed: 07/29/2019 4:54:02 PM By: Harold Barban Entered By: Harold Barban on 07/29/2019 13:04:44 Urda, [name] -------------------------------------------------------------------------------- Vitals Details Patient Name: MERCEDES, WORTHY. Date of Service: 07/29/2019 12:45 PM Medical Record Number: [id number] Patient Account Number: [id number] Date of Birth/Sex: May 19, 1946 (73 y.o. F) Treating RN: Harold Barban Primary Care Drake Landing: [name] Other Clinician: Referring Atlee Villers: Danise Mina  JAVIER Treating Leaann Nevils/Extender: [name] Dillon Weeks in Treatment: 33 Vital Signs Time Taken: 12:52 Temperature (F): 98.6 Height (in): 63 Pulse (bpm): 79 Source: Stated Respiratory Rate (breaths/min): 18 Weight (lbs): 224.7 Blood Pressure (mmHg): 156/76 Source: Stated Reference Range: 80 - 120 mg / dl Body Mass Index (BMI): 39.8 Electronic Signature(s) Signed: 07/29/2019 4:54:02 PM By: Harold Barban Entered By: Harold Barban on 07/29/2019 12:53:28

## 2019-07-31 LAB — SARS CORONAVIRUS 2 (TAT 6-24 HRS): SARS Coronavirus 2: NEGATIVE

## 2019-08-03 ENCOUNTER — Other Ambulatory Visit: Payer: Self-pay

## 2019-08-03 ENCOUNTER — Encounter (HOSPITAL_COMMUNITY)
Admission: RE | Disposition: A | Discharge status: Home / Self Care | Payer: Self-pay | Source: Home / Self Care | Attending: Vascular Surgery

## 2019-08-03 ENCOUNTER — Ambulatory Visit (HOSPITAL_COMMUNITY)
Admission: RE | Admit: 2019-08-03 | Discharge: 2019-08-03 | Disposition: A | Discharge status: Home / Self Care | Payer: Medicare Other | Attending: Vascular Surgery | Admitting: Vascular Surgery

## 2019-08-03 DIAGNOSIS — Z8249 Family history of ischemic heart disease and other diseases of the circulatory system: Secondary | ICD-10-CM | POA: Diagnosis not present

## 2019-08-03 DIAGNOSIS — Z888 Allergy status to other drugs, medicaments and biological substances status: Secondary | ICD-10-CM | POA: Diagnosis not present

## 2019-08-03 DIAGNOSIS — I872 Venous insufficiency (chronic) (peripheral): Secondary | ICD-10-CM | POA: Insufficient documentation

## 2019-08-03 DIAGNOSIS — Z79899 Other long term (current) drug therapy: Secondary | ICD-10-CM | POA: Diagnosis not present

## 2019-08-03 DIAGNOSIS — R2242 Localized swelling, mass and lump, left lower limb: Secondary | ICD-10-CM | POA: Diagnosis not present

## 2019-08-03 DIAGNOSIS — G4733 Obstructive sleep apnea (adult) (pediatric): Secondary | ICD-10-CM | POA: Insufficient documentation

## 2019-08-03 DIAGNOSIS — Z882 Allergy status to sulfonamides status: Secondary | ICD-10-CM | POA: Insufficient documentation

## 2019-08-03 DIAGNOSIS — G2581 Restless legs syndrome: Secondary | ICD-10-CM | POA: Diagnosis not present

## 2019-08-03 DIAGNOSIS — Z87891 Personal history of nicotine dependence: Secondary | ICD-10-CM | POA: Diagnosis not present

## 2019-08-03 DIAGNOSIS — Z86718 Personal history of other venous thrombosis and embolism: Secondary | ICD-10-CM | POA: Diagnosis not present

## 2019-08-03 DIAGNOSIS — J45909 Unspecified asthma, uncomplicated: Secondary | ICD-10-CM | POA: Diagnosis not present

## 2019-08-03 DIAGNOSIS — I10 Essential (primary) hypertension: Secondary | ICD-10-CM | POA: Insufficient documentation

## 2019-08-03 DIAGNOSIS — G709 Myoneural disorder, unspecified: Secondary | ICD-10-CM | POA: Insufficient documentation

## 2019-08-03 DIAGNOSIS — L97221 Non-pressure chronic ulcer of left calf limited to breakdown of skin: Secondary | ICD-10-CM | POA: Diagnosis not present

## 2019-08-03 HISTORY — PX: CORONARY ULTRASOUND/IVUS: CATH118244

## 2019-08-03 HISTORY — PX: PERIPHERAL VASCULAR INTERVENTION: CATH118257

## 2019-08-03 HISTORY — PX: LOWER EXTREMITY VENOGRAPHY: CATH118253

## 2019-08-03 LAB — POCT I-STAT, CHEM 8
BUN: 13 mg/dL (ref 8–23)
Calcium, Ion: 1.19 mmol/L (ref 1.15–1.40)
Chloride: 103 mmol/L (ref 98–111)
Creatinine, Ser: 0.6 mg/dL (ref 0.44–1.00)
Glucose, Bld: 99 mg/dL (ref 70–99)
HCT: 38 % (ref 36.0–46.0)
Hemoglobin: 12.9 g/dL (ref 12.0–15.0)
Potassium: 3.8 mmol/L (ref 3.5–5.1)
Sodium: 141 mmol/L (ref 135–145)
TCO2: 26 mmol/L (ref 22–32)

## 2019-08-03 SURGERY — LOWER EXTREMITY VENOGRAPHY
Anesthesia: LOCAL

## 2019-08-03 MED ORDER — HEPARIN (PORCINE) IN NACL 1000-0.9 UT/500ML-% IV SOLN
INTRAVENOUS | Status: DC | PRN
  Administered 2019-08-03: 500 mL

## 2019-08-03 MED ORDER — LIDOCAINE HCL (PF) 1 % IJ SOLN
INTRAMUSCULAR | Status: DC | PRN
  Administered 2019-08-03: 15 mL via INTRADERMAL

## 2019-08-03 MED ORDER — CLOPIDOGREL BISULFATE 300 MG PO TABS
ORAL_TABLET | ORAL | Status: DC | PRN
  Administered 2019-08-03: 300 mg via ORAL

## 2019-08-03 MED ORDER — MIDAZOLAM HCL 2 MG/2ML IJ SOLN
INTRAMUSCULAR | Status: DC | PRN
  Administered 2019-08-03: 1 mg via INTRAVENOUS

## 2019-08-03 MED ORDER — HEPARIN (PORCINE) IN NACL 1000-0.9 UT/500ML-% IV SOLN
INTRAVENOUS | Status: AC
  Filled 2019-08-03: qty 1000

## 2019-08-03 MED ORDER — HEPARIN SODIUM (PORCINE) 1000 UNIT/ML IJ SOLN
INTRAMUSCULAR | Status: DC | PRN
  Administered 2019-08-03: 5000 [IU] via INTRAVENOUS

## 2019-08-03 MED ORDER — SODIUM CHLORIDE 0.9 % IV SOLN: INTRAVENOUS | Status: DC

## 2019-08-03 MED ORDER — CLOPIDOGREL BISULFATE 300 MG PO TABS
ORAL_TABLET | ORAL | Status: AC
  Filled 2019-08-03: qty 1

## 2019-08-03 MED ORDER — HEPARIN SODIUM (PORCINE) 1000 UNIT/ML IJ SOLN
INTRAMUSCULAR | Status: AC
  Filled 2019-08-03: qty 1

## 2019-08-03 MED ORDER — FENTANYL CITRATE (PF) 100 MCG/2ML IJ SOLN
INTRAMUSCULAR | Status: AC
  Filled 2019-08-03: qty 2

## 2019-08-03 MED ORDER — IODIXANOL 320 MG/ML IV SOLN
INTRAVENOUS | Status: DC | PRN
  Administered 2019-08-03: 30 mL via INTRAVENOUS

## 2019-08-03 MED ORDER — MIDAZOLAM HCL 2 MG/2ML IJ SOLN
INTRAMUSCULAR | Status: AC
  Filled 2019-08-03: qty 2

## 2019-08-03 MED ORDER — CLOPIDOGREL BISULFATE 75 MG PO TABS: 75.0000 mg | ORAL_TABLET | Freq: Every day | ORAL | 3 refills | Status: DC

## 2019-08-03 MED ORDER — FENTANYL CITRATE (PF) 100 MCG/2ML IJ SOLN
INTRAMUSCULAR | Status: DC | PRN
  Administered 2019-08-03: 25 ug via INTRAVENOUS

## 2019-08-03 SURGICAL SUPPLY — 14 items
BALLN MUSTANG 12X60X75 (BALLOONS) ×3
BALLOON MUSTANG 12X60X75 (BALLOONS) ×2 IMPLANT
CATH TEMPO AQUA 5F 100CM (CATHETERS) ×3 IMPLANT
CATH VISIONS PV .035 IVUS (CATHETERS) ×3 IMPLANT
COVER DOME SNAP 22 D (MISCELLANEOUS) ×3 IMPLANT
KIT ENCORE 26 ADVANTAGE (KITS) ×3 IMPLANT
KIT MICROPUNCTURE NIT STIFF (SHEATH) ×3 IMPLANT
SHEATH PINNACLE 8F 10CM (SHEATH) ×3 IMPLANT
SHEATH PINNACLE 9F 10CM (SHEATH) ×3 IMPLANT
SHEATH PROBE COVER 6X72 (BAG) ×3 IMPLANT
STENT VICI VENOUS 14X60 (Permanent Stent) ×3 IMPLANT
TRAY PV CATH (CUSTOM PROCEDURE TRAY) ×3 IMPLANT
WIRE AMPLATZ SS-J .035X260CM (WIRE) ×3 IMPLANT
WIRE BENTSON .035X145CM (WIRE) ×3 IMPLANT

## 2019-08-03 NOTE — Progress Notes (Signed)
No bleeding noted to left popliteal dressing

## 2019-08-03 NOTE — Op Note (Signed)
    Patient name: [name] [name] DOB: 1946/01/28 Sex: female  08/03/2019 Pre-operative Diagnosis: Left lower extremity swelling with ulceration Post-operative diagnosis:  Same Surgeon:  Erlene Quan C. Donzetta Matters, MD Procedure Performed: 1.  Ultrasound-guided cannulation left popliteal vein 2.  Left lower extremity venogram 3.  Intravascular ultrasound of left femoral, left common femoral, left external and common iliac veins and IVC 4.  Stent of left common iliac vein with 14 x 60 mm Vici 5.  Moderate sedation with fentanyl and Versed for 34 minutes  Indications: 73 year old female has undergone greater saphenous vein ablation.  She has wounds on her left lower extremity does not have any significant return of her saphenous reflux.  She does have reflux in her common femoral and femoral veins and is indicated for venogram possible intervention.  Findings: Femoral vein and common femoral vein appear to have rouleaux flow prior to any intervention.  The common iliac vein peripherally measured 12.2 mm and at the level of L4-L5 measured 3 mm.  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  After stenting measured greater than 10 mm.  Rouleaux flow in the common femoral and femoral vein was much improved.   Procedure:  The patient was identified in the holding area and taken to room 8.  The patient was then placed prone on the operating table prepped and draped in bilateral popliteal fossae and timeout was called.  Ultrasound was used to identify the popliteal vein on the left this was cannulated micropuncture needle followed by wire sheath.  An image was saved the permanent record.  We placed a Bentson wire followed by 8 [place] sheath.  We perform left lower extremity venography which all appeared patent.  We then performed intravascular ultrasound there demonstrated rouleaux flow at the common femoral femoral vein and what appeared to be subtotal occlusion at the level of the crossing right common  iliac artery.  We had placed an Amplatz wire into the right subclavian vein this was marked on the table.  Patient was heparinized 5000 use of heparin.  We elected to stent after performing intravascular ultrasound.  We stented from healthy vein just at the IVC confluence down to healthy common iliac vein.  This was postdilated with a 12 mm balloon.  Completion demonstrated a lumen that was much improved up to greater than 10 mm.  We performed repeat IVIS and venography.  The wire was removed sheath was pulled and pressure held till hemostasis obtained.  She tolerated procedure without immediate complication.  Contrast: 30 cc   Nanako Stopher C. Donzetta Matters, MD [facility] Office: [phone] Pager: [phone]

## 2019-08-03 NOTE — H&P (Signed)
HPI [name] is a 73 y.o. female is undergone saphenous vein ablation Dr. Kellie Simmering 2016. Now has recurrent ulcer. Wound had previously healed but opened up again approximately 1 month ago. She has had significantly increased edema recently has been placed on Lasix and metolazone. Patient cannot tolerate compression stockings but is undergoing compression therapy with Dr. Dellia Nims at the wound care center. Has a left medial ulceration which is much improved with care. Left 1 also healing but is the newest one. Does have varicosities in her thigh tracing on the left lateral aspect of her leg was offered staff lobectomy with Dr. Kellie Simmering few years ago given that this is mostly cosmetic she does not want to pursue this. Does not have issues in the right lower extremity. States that she has been treated for pseudogout as well as neuropathy and now her only pain is associated with the ulcerations themselves. She is also planning to undergo right sided carpal tunnel release in the near future.      Past Medical History:  Diagnosis Date  . Allergy   . Anesthesia complication    trouble waking up 2/2 CPAP  . Arthritis   . Asthma in adult   . Cataract 2019   left eye resolved with surgery  . Cervical spondylosis 2012   Jacelyn Grip, [place])  . Choroidal nevus of left eye 03/22/2016  . Chronic venous insufficiency 2016   severe reflux with painful varicosities sees [facility]  . Clotting disorder ([place])    due to vein ablation   . Complication of anesthesia    difficulty coming out due to sleep apnea per pt  . Difficult intubation    PATIENT DENIES   . Diverticulosis    per ct scans 09-2016 and 01-2017  . DVT (deep venous thrombosis) ([place]) 2011   small, developed after venous ablation  . DVT (deep venous thrombosis) ([place])   . Family history of adverse reaction to anesthesia    O2 levels drop upon waking   . Hearing loss sensory, bilateral 2013   mod-severe high freq sensorineural ([facility])  .  Hiatal hernia    09-2016 ct scan HP at cancer center   . History of kidney stones 1980s  . History of radiation therapy 07/19/11-08/25/2011   RIGHT AXILLARY REGION/METASTATIC  . Hypertension   . Melanoma ([place]) 06/29/10   MALIGNANT MELANOMA R SHOULDER/SUPRASCAPULAR BACK s/p interferon chemo and XRT  . Neuromuscular disorder ([facility])    muscle spasms  . OSA (obstructive sleep apnea)    on CPAP  . Pneumonia    2001  . RLS (restless legs syndrome)   . Seasonal allergies   . Sleep apnea    wears cpap  . Tinnitus   . Unspecified vitamin D deficiency 03/18/2013  . Varicose veins of left lower extremity         Family History  Problem Relation Age of Onset  . Cancer Mother 64   uterine  . CAD Father 83   MI  . Hypertension Father 52  . Alzheimer's disease Father 57  . Cancer Cousin    x2, breast  . Diabetes Sister   . Cancer Paternal Grandmother    melanoma, possibly  . Colon cancer Neg Hx   . Colon polyps Neg Hx   . Rectal cancer Neg Hx   . Stomach cancer Neg Hx         Past Surgical History:  Procedure Laterality Date  . ABI  2016   WNL  .  ANTERIOR CERVICAL DECOMP/DISCECTOMY FUSION N/A 08/22/2015   ANTERIOR CERVICAL DECOMPRESSION FUSION C3/4 - interbody graft and anterior plate; Kristeen Miss, MD  . ANTERIOR CERVICAL DECOMP/DISCECTOMY FUSION N/A 07/02/2016   Procedure: Cervical four- five Cervical five- six Cervical six- seven Anterior cervical decompression/diskectomy/fusion; Surgeon: Kristeen Miss, MD; Location: [facility]; Service: Neurosurgery; Laterality: N/A; C4-5 C5-6 C6-7 Anterior cervical decompression/diskectomy/fusion  . BREAST BIOPSY Left 2013   BENIGN  . BREAST SURGERY Right 1990   BX   . CARDIOVASCULAR STRESS TEST  2010   normal stress test, EF 66%  . CATARACT EXTRACTION W/ INTRAOCULAR LENS IMPLANT Left 2019   Dr. Kathrin Penner  . COLONOSCOPY  03/2005   benign polyp, int hem, rpt 5 yrs ([place], [place])  . COLONOSCOPY WITH PROPOFOL N/A 01/06/2018    diverticulosis, decreased sphincter tone, no f/u needed [name], Kirke Corin, MD)  . DEEP AXILLARY SENTINEL NODE BIOPSY / EXCISION  2012   RIGHT  . DEXA  06/2016   WNL  . DILATION AND CURETTAGE OF UTERUS  2010  . ENDOVENOUS ABLATION SAPHENOUS VEIN W/ LASER Left 2010   [facility]  . ENDOVENOUS ABLATION SAPHENOUS VEIN W/ LASER Left 2016   accessory branch [facility] Kellie Simmering)  . ESOPHAGOGASTRODUODENOSCOPY  2006   mod chronic carditis, no H pylori ([place], [place])  . [place]   left  . LUMBAR LAMINECTOMY  2001   L4/5  . MELANOMA EXCISION  2011   Right shoulder, with sentinel lymph node biopsy  . PORT-A-CATH REMOVAL  12/05/2011   Procedure: REMOVAL PORT-A-CATH; Surgeon: Rolm Bookbinder, MD; Location: [facility]; Service: General; Laterality: N/A; left port removal  . PORTACATH PLACEMENT     left subclavian  . UPPER GASTROINTESTINAL ENDOSCOPY    Short Social History:  Social History        Tobacco Use  . Smoking status: Former Smoker    Types: Cigarettes    Quit date: 11/05/1965    Years since quitting: 53.6  . Smokeless tobacco: Never Used  . Tobacco comment: socially as a teen  Substance Use Topics  . Alcohol use: No    Alcohol/week: 0.0 standard drinks        Allergies  Allergen Reactions  . Sulfa Antibiotics Itching, Swelling and Shortness Of Breath    Facial swelling  . Voltaren [Diclofenac Sodium] Shortness Of Breath  . Latex Other (See Comments)    Blisters  ONLY HAD REACTION TO TAPE (??? ONLY ADHESIVE ALLERGY)  . Tape Other (See Comments)    Caused blisters - must use paper tape - same reaction from NeoPrene  . Other     Neoprene         Current Outpatient Medications  Medication Sig Dispense Refill  . acetaminophen (TYLENOL ARTHRITIS PAIN) 650 MG CR tablet Take 1,300 mg by mouth every 8 (eight) hours as needed for pain.     Marland Kitchen albuterol (PROVENTIL HFA;VENTOLIN HFA) 108 (90 Base) MCG/ACT inhaler INHALE 2 PUFFS INTO LUNGS EVERY 6 HOURS AS NEEDED  FOR WHEEZING OR SHORTNESS OF BREATH 8.5 Inhaler 3  . albuterol (PROVENTIL) (2.5 MG/3ML) 0.083% nebulizer solution Take 3 mLs (2.5 mg total) by nebulization every 6 (six) hours as needed for wheezing or shortness of breath. 150 mL 1  . aspirin EC 81 MG tablet Take 81 mg by mouth at bedtime. Melanoma prevention    . azelastine (ASTELIN) 0.1 % nasal spray Place 1 spray into both nostrils 2 (two) times daily. Use in each nostril  as directed 30 mL 3  . famotidine (PEPCID) 10 MG tablet Take 10 mg by mouth daily.    . furosemide (LASIX) 40 MG tablet Take 1 tablet (40 mg total) by mouth daily as needed for edema (take extra lasix 40mg  in afternoons as needed). 30 tablet 0  . losartan (COZAAR) 25 MG tablet TAKE 2 TABLETS BY MOUTH EVERY DAY 60 tablet 5  . meloxicam (MOBIC) 15 MG tablet Take 15 mg by mouth daily. As needed  3  . montelukast (SINGULAIR) 10 MG tablet TAKE 1 TABLET BY MOUTH EVERY DAY 30 tablet 2  . Multiple Vitamin (MULTIVITAMIN WITH MINERALS) TABS tablet Take 1 tablet by mouth daily.    . Polyvinyl Alcohol-Povidone (TEARS PLUS OP) Place 1 drop into both eyes daily as needed (dry eyes/ redness/ burning).    . potassium chloride SA (K-DUR,KLOR-CON) 20 MEQ tablet Take 1 tablet (20 mEq total) by mouth daily as needed (take one extra potassium with lasix). 30 tablet 0  . PRESCRIPTION MEDICATION CPAP    . Probiotic Product (PROBIOTIC DAILY PO) Take 1 tablet by mouth daily.     Marland Kitchen triamcinolone (NASACORT ALLERGY 24HR) 55 MCG/ACT AERO nasal inhaler Place 2 sprays into the nose daily. In each nostril    . UNABLE TO FIND Take by mouth.    . Vitamin D, Ergocalciferol, (DRISDOL) 1.25 MG (50000 UT) CAPS capsule TAKE 1 CAPSULE (50,000 UNITS TOTAL) BY MOUTH EVERY 7 (SEVEN) DAYS. 12 capsule 3   No current facility-administered medications for this visit.   Review of Systems  Constitutional: Constitutional negative.  HENT: HENT negative.  Eyes: Eyes negative.  Respiratory: Respiratory negative.   Cardiovascular: Positive for leg swelling.  GI: Gastrointestinal negative.  Musculoskeletal: Positive for joint pain.  Skin: Positive for wound.  Neurological: Positive for numbness.  Hematologic: Hematologic/lymphatic negative.  Psychiatric: Psychiatric negative.   Objective:      Vitals:   06/05/19 1453  BP: 130/81  Pulse: 88  Resp: 18  Temp: (!) 97.4 F (36.3 C)  SpO2: 98%  Physical Exam  HENT:  Head: Normocephalic.  Nose: Nose normal.  Eyes:  Pupils: Pupils are equal, round, and reactive to light.  Cardiovascular:  Rate and Rhythm: Normal rate.  Pulses:  Radial pulses are 2+ on the right side and 2+ on the left side.  Popliteal pulses are 2+ on the right side and 2+ on the left side.  Posterior tibial pulses are 2+ on the right side and 2+ on the left side.  Pulmonary:  Effort: Pulmonary effort is normal.  Abdominal:  General: Abdomen is flat.  Palpations: Abdomen is soft.  Musculoskeletal:  General: Swelling present.  Right lower leg: No edema.  Left lower leg: Edema present.  Skin:  Capillary Refill: Capillary refill takes less than 2 seconds.  Comments: Ulceration on the left medial and lateral leg. Skin changes consistent with venous insufficiency  Neurological:  General: No focal deficit present.  Mental Status: She is alert.  Psychiatric:  Mood and Affect: Mood normal.  Behavior: Behavior normal.  Thought Content: Thought content normal.  Judgment: Judgment normal.   Data:  I independently interpreted her left lower extremity reflux study. Demonstrates reflux in the common femoral vein and femoral vein up to 6500 ms. Greater saphenous vein has reflux in the saphenofemoral junction proximal thigh up to 3700 ms. Vein diameters 1.1 cm saphenofemoral junction proximal thigh 0.78 in the proximal calf 0.4 cm.    Assessment/Plan:   73 year old female with left lower  extremity ulceration appears venous in nature. There are palpable posterior tibial pulses  bilaterally to suggest that there is not an arterial component. From a superficial venous standpoint it appears we have really treated her greater saphenous vein adequately small saphenous vein does not appear to be involved significantly with greater size 0.37 cm. She does have significant deep venous reflux which unfortunately is not treatable.  Concern for deep venous obstruction.  We will plan for prone left popliteal approach venography.  Possibly will need right popliteal approach as well.  Possibly will require stenting.  We have discussed the possible outcomes and will proceed today.  Averleigh Savary C. Donzetta Matters, MD [facility] Office: [phone] Pager: [phone]

## 2019-08-03 NOTE — Discharge Instructions (Signed)
Venogram, Care After °This sheet gives you information about how to care for yourself after your procedure. Your health care provider may also give you more specific instructions. If you have problems or questions, contact your health care provider. °What can I expect after the procedure? °After the procedure, it is common to have: °· Bruising or mild discomfort in the area where the IV was inserted (insertion site). °Follow these instructions at home: °Eating and drinking ° °· Follow instructions from your health care provider about eating or drinking restrictions. °· Drink a lot of fluids for the first several days after the procedure, as directed by your health care provider. This helps to wash (flush) the contrast out of your body. Examples of healthy fluids include water or low-calorie drinks. °General instructions °· Check your IV insertion area every day for signs of infection. Check for: °? Redness, swelling, or pain. °? Fluid or blood. °? Warmth. °? Pus or a bad smell. °· Take over-the-counter and prescription medicines only as told by your health care provider. °· Rest and return to your normal activities as told by your health care provider. Ask your health care provider what activities are safe for you. °· Do not drive for 24 hours if you were given a medicine to help you relax (sedative), or until your health care provider approves. °· Keep all follow-up visits as told by your health care provider. This is important. °Contact a health care provider if: °· Your skin becomes itchy or you develop a rash or hives. °· You have a fever that does not get better with medicine. °· You feel nauseous. °· You vomit. °· You have redness, swelling, or pain around the insertion site. °· You have fluid or blood coming from the insertion site. °· Your insertion area feels warm to the touch. °· You have pus or a bad smell coming from the insertion site. °Get help right away if: °· You have difficulty breathing or  shortness of breath. °· You develop chest pain. °· You faint. °· You feel very dizzy. °These symptoms may represent a serious problem that is an emergency. Do not wait to see if the symptoms will go away. Get medical help right away. Call your local emergency services (911 in the [place] Do not drive yourself to the hospital. °Summary °· After your procedure, it is common to have bruising or mild discomfort in the area where the IV was inserted. °· You should check your IV insertion area every day for signs of infection. °· Take over-the-counter and prescription medicines only as told by your health care provider. °· You should drink a lot of fluids for the first several days after the procedure to help flush the contrast from your body. °This information is not intended to replace advice given to you by your health care provider. Make sure you discuss any questions you have with your health care provider. °Document Released: 08/12/2013 Document Revised: 10/04/2017 Document Reviewed: 09/15/2016 °Elsevier Patient Education © 2020 [employer] ° °

## 2019-08-04 ENCOUNTER — Encounter (HOSPITAL_COMMUNITY): Payer: Self-pay | Admitting: Vascular Surgery

## 2019-08-04 ENCOUNTER — Other Ambulatory Visit: Payer: Self-pay

## 2019-08-04 DIAGNOSIS — I890 Lymphedema, not elsewhere classified: Secondary | ICD-10-CM | POA: Diagnosis not present

## 2019-08-04 DIAGNOSIS — J45909 Unspecified asthma, uncomplicated: Secondary | ICD-10-CM | POA: Diagnosis not present

## 2019-08-04 DIAGNOSIS — I10 Essential (primary) hypertension: Secondary | ICD-10-CM | POA: Diagnosis not present

## 2019-08-04 DIAGNOSIS — G4730 Sleep apnea, unspecified: Secondary | ICD-10-CM | POA: Diagnosis not present

## 2019-08-04 DIAGNOSIS — L97221 Non-pressure chronic ulcer of left calf limited to breakdown of skin: Secondary | ICD-10-CM | POA: Diagnosis not present

## 2019-08-04 DIAGNOSIS — M1990 Unspecified osteoarthritis, unspecified site: Secondary | ICD-10-CM | POA: Diagnosis not present

## 2019-08-04 NOTE — Progress Notes (Signed)
[name] Visit Report for 08/04/2019 Arrival Information Details Patient Name: [name], [name]. Date of Service: 08/04/2019 1:30 PM Medical Record Number: [id number] Patient Account Number: [id number] Date of Birth/Sex: 03-Dec-1945 (73 y.o. F) Treating RN: Army Melia Primary Care Jurline Folger: [name] Other Clinician: Referring Anothony Bursch: [name] Treating Kearstin Learn/Extender: Melburn Hake, HOYT Weeks in Treatment: 70 Visit Information History Since Last Visit Added or deleted any medications: No Patient Arrived: C[place] Any new allergies or adverse reactions: No Arrival Time: 13:53 Had a fall or experienced change in No Accompanied By: self activities of daily living that may affect Transfer Assistance: None risk of falls: Patient Identification Verified: Yes Signs or symptoms of abuse/neglect since last visito No Patient Requires Transmission-Based No Hospitalized since last visit: No Precautions: Has Dressing in Place as Prescribed: Yes Patient Has Alerts: Yes Pain Present Now: No Patient Alerts: Patient on Blood Thinner aspirin 81 Electronic Signature(s) Signed: 08/04/2019 3:51:55 PM By: Army Melia Entered By: Army Melia on 08/04/2019 13:53:37 Curlin, [name] ([id number]) -------------------------------------------------------------------------------- Compression Therapy Details Patient Name: [name]. Date of Service: 08/04/2019 1:30 PM Medical Record Number: [id number] Patient Account Number: [id number] Date of Birth/Sex: [birth year] (73 y.o. F) Treating RN: Army Melia Primary Care Chancellor Vanderloop: [name] Other Clinician: Referring Cydni Reddoch: [name] Treating Eilidh Marcano/Extender: STONE III, HOYT Weeks in Treatment: 33 Compression Therapy Performed for Wound Assessment: Wound #5 Left,Medial Lower Leg Performed By: Clinician Army Melia, RN Compression Type: Four Layer Pre Treatment ABI: 1 Electronic  Signature(s) Signed: 08/04/2019 3:51:55 PM By: Army Melia Entered By: Army Melia on 08/04/2019 14:14:42 Mexicano, [name] ([id number]) -------------------------------------------------------------------------------- Compression Therapy Details Patient Name: [name]. Date of Service: 08/04/2019 1:30 PM Medical Record Number: [id number] Patient Account Number: [id number] Date of Birth/Sex: [birth year] (73 y.o. F) Treating RN: Army Melia Primary Care Rishabh Rinkenberger: [name] Other Clinician: Referring Brandin Stetzer: [name] Treating Janal Haak/Extender: STONE III, HOYT Weeks in Treatment: 33 Compression Therapy Performed for Wound Assessment: Wound #6 Left,Lateral Lower Leg Performed By: Clinician Army Melia, RN Compression Type: Four Layer Pre Treatment ABI: 1 Electronic Signature(s) Signed: 08/04/2019 3:51:55 PM By: Army Melia Entered By: Army Melia on 08/04/2019 14:14:42 Reiland, [name] ([id number]) -------------------------------------------------------------------------------- Compression Therapy Details Patient Name: [name]. Date of Service: 08/04/2019 1:30 PM Medical Record Number: [id number] Patient Account Number: [id number] Date of Birth/Sex: 1946-09-21 (73 y.o. F) Treating RN: Army Melia Primary Care Tavaris Eudy: [name] Other Clinician: Referring Anysha Frappier: [name] Treating Ladale Sherburn/Extender: STONE III, HOYT Weeks in Treatment: 33 Compression Therapy Performed for Wound Assessment: Wound #9 Left,Lateral,Posterior Lower Leg Performed By: Clinician Army Melia, RN Compression Type: Four Layer Pre Treatment ABI: 1 Electronic Signature(s) Signed: 08/04/2019 3:51:55 PM By: Army Melia Entered By: Army Melia on 08/04/2019 14:14:42 Pickup, [name] ([id number]) -------------------------------------------------------------------------------- Wound Assessment Details Patient Name: [name]. Date of Service: 08/04/2019 1:30  PM Medical Record Number: [id number] Patient Account Number: [id number] Date of Birth/Sex: [birth year] (73 y.o. F) Treating RN: Army Melia Primary Care Savalas Monje: [name] Other Clinician: Referring Mkayla Steele: [name] Treating Jatavis Malek/Extender: STONE III, HOYT Weeks in Treatment: 33 Wound Status Wound Number: 5 Primary Etiology: Lymphedema Wound Location: Left, Medial Lower Leg Wound Status: Open Wounding Event: Gradually Appeared Date Acquired: 11/19/2018 Weeks Of Treatment: 33 Clustered Wound: No Wound Measurements Length: (cm) 5.5 Width: (cm) 3.7 Depth: (cm) 0.1 Area: (cm) 15.983 Volume: (cm) 1.598 % Reduction in Area: -87% % Reduction in Volume: -86.9% Wound Description Full Thickness Without Exposed Support Classification: Structures Treatment Notes Wound #5 (  Left, Medial Lower Leg) Notes ABD 4 layer Electronic Signature(s) Signed: 08/04/2019 3:51:55 PM By: Army Melia Entered By: Army Melia on 08/04/2019 14:14:23 Bevis, [name] -------------------------------------------------------------------------------- Wound Assessment Details Patient Name: [name]. Date of Service: 08/04/2019 1:30 PM Medical Record Number: [id number] Patient Account Number: [id number] Date of Birth/Sex: [birth year] (73 y.o. F) Treating RN: Army Melia Primary Care Jarian Longoria: [name] Other Clinician: Referring Devanie Galanti: [name] Treating Mystic Labo/Extender: STONE III, HOYT Weeks in Treatment: 33 Wound Status Wound Number: 6 Primary Etiology: Venous Leg Ulcer Wound Location: Left, Lateral Lower Leg Wound Status: Open Wounding Event: Gradually Appeared Date Acquired: 01/19/2019 Weeks Of Treatment: 28 Clustered Wound: No Wound Measurements Length: (cm) 11 Width: (cm) 8.5 Depth: (cm) 0.1 Area: (cm) 73.435 Volume: (cm) 7.343 % Reduction in Area: -33279.5% % Reduction in Volume: -33277.3% Wound Description Full Thickness  Without Exposed Support Classification: Structures Treatment Notes Wound #6 (Left, Lateral Lower Leg) Notes ABD 4 layer Electronic Signature(s) Signed: 08/04/2019 3:51:55 PM By: Army Melia Entered By: Army Melia on 08/04/2019 14:14:23 Coad, [name] -------------------------------------------------------------------------------- Wound Assessment Details Patient Name: ASAKO, ALLEGRO. Date of Service: 08/04/2019 1:30 PM Medical Record Number: [id number] Patient Account Number: [id number] Date of Birth/Sex: 20-Oct-1946 (73 y.o. F) Treating RN: Army Melia Primary Care Annsleigh Dragoo: [name] Other Clinician: Referring Yeng Frankie: [name] Treating Tomie Spizzirri/Extender: STONE III, HOYT Weeks in Treatment: 33 Wound Status Wound Number: 9 Primary Etiology: Venous Leg Ulcer Wound Location: Left, Lateral, Posterior Lower Leg Wound Status: Open Wounding Event: Gradually Appeared Date Acquired: 07/08/2019 Weeks Of Treatment: 3 Clustered Wound: No Wound Measurements Length: (cm) 4.5 Width: (cm) 4 Depth: (cm) 0.1 Area: (cm) 14.137 Volume: (cm) 1.414 % Reduction in Area: -2402.1% % Reduction in Volume: -2380.7% Wound Description Full Thickness Without Exposed Support Classification: Structures Treatment Notes Wound #9 (Left, Lateral, Posterior Lower Leg) Notes ABD 4 layer Electronic Signature(s) Signed: 08/04/2019 3:51:55 PM By: Army Melia Entered By: Army Melia on 08/04/2019 14:14:23

## 2019-08-05 ENCOUNTER — Encounter: Payer: Medicare Other | Admitting: Internal Medicine

## 2019-08-05 DIAGNOSIS — L97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d: Secondary | ICD-10-CM | POA: Diagnosis not present

## 2019-08-05 DIAGNOSIS — I890 Lymphedema, not elsewhere classified: Secondary | ICD-10-CM | POA: Diagnosis not present

## 2019-08-05 DIAGNOSIS — L97222 Non-pressure chronic ulcer of left calf with fat layer exposed: Secondary | ICD-10-CM | POA: Diagnosis not present

## 2019-08-05 DIAGNOSIS — I10 Essential (primary) hypertension: Secondary | ICD-10-CM | POA: Diagnosis not present

## 2019-08-05 DIAGNOSIS — J45909 Unspecified asthma, uncomplicated: Secondary | ICD-10-CM | POA: Diagnosis not present

## 2019-08-05 DIAGNOSIS — G4730 Sleep apnea, unspecified: Secondary | ICD-10-CM | POA: Diagnosis not present

## 2019-08-05 DIAGNOSIS — M1990 Unspecified osteoarthritis, unspecified site: Secondary | ICD-10-CM | POA: Diagnosis not present

## 2019-08-05 DIAGNOSIS — L97221 Non-pressure chronic ulcer of left calf limited to breakdown of skin: Secondary | ICD-10-CM | POA: Diagnosis not present

## 2019-08-05 NOTE — Progress Notes (Signed)
[name], [name] ([id number]) Visit Report for 08/05/2019 Debridement Details Patient Name: [name], [name]. Date of Service: 08/05/2019 12:45 PM Medical Record Number: [id number] Patient Account Number: [id number] Date of Birth/Sex: 05/21/1946 (73 y.o. F) Treating RN: Cornell Barman Primary Care Provider: [name] Other Clinician: Referring Provider: [name] Treating Provider/Extender: Tito Dine in Treatment: 34 Debridement Performed for Wound #5 Left,Medial Lower Leg Assessment: Performed By: Physician [name] Dillon, MD Debridement Type: Debridement Level of Consciousness (Pre- Awake and Alert procedure): Pre-procedure Verification/Time Yes - 13:20 Out Taken: Start Time: 13:21 Pain Control: Lidocaine Total Area Debrided (L x W): 3 (cm) x 3.1 (cm) = 9.3 (cm) Tissue and other material Viable, Non-Viable, Slough, Subcutaneous, Slough debrided: Level: Skin/Subcutaneous Tissue Debridement Description: Excisional Instrument: Curette Bleeding: Minimum Hemostasis Achieved: Pressure End Time: 13:30 Response to Treatment: Procedure was tolerated well Level of Consciousness Awake and Alert (Post-procedure): Post Debridement Measurements of Total Wound Length: (cm) 3 Width: (cm) 3.1 Depth: (cm) 0.1 Volume: (cm) 0.73 Character of Wound/Ulcer Post Debridement: Requires Further Debridement Post Procedure Diagnosis Same as Pre-procedure Electronic Signature(s) Signed: 08/05/2019 4:51:07 PM By: Linton Ham MD Signed: 08/05/2019 5:01:16 PM By: [name], [name], RN, CWS, [name], [name] Entered By: Linton Ham on 08/05/2019 [place], [name] -------------------------------------------------------------------------------- Debridement Details Patient Name: [name], [name]. Date of Service: 08/05/2019 12:45 PM Medical Record Number: [id number] Patient Account Number: [id number] Date of Birth/Sex: 04-Aug-1946 (73 y.o. F) Treating RN: Cornell Barman Primary Care Provider: [name] Other Clinician: Referring Provider: [name] Treating Provider/Extender: Tito Dine in Treatment: 34 Debridement Performed for Wound #6 Left,Lateral Lower Leg Assessment: Performed By: Physician [name] Dillon, MD Debridement Type: Debridement Severity of Tissue Pre Fat layer exposed Debridement: Level of Consciousness (Pre- Awake and Alert procedure): Pre-procedure Verification/Time Yes - 13:20 Out Taken: Start Time: 13:21 Pain Control: Lidocaine Total Area Debrided (L x W): 4 (cm) x 4 (cm) = 16 (cm) Tissue and other material Viable, Non-Viable, Slough, Subcutaneous, Slough debrided: Level: Skin/Subcutaneous Tissue Debridement Description: Excisional Instrument: Curette Bleeding: Minimum Hemostasis Achieved: Pressure End Time: 13:30 Response to Treatment: Procedure was tolerated well Level of Consciousness Awake and Alert (Post-procedure): Post Debridement Measurements of Total Wound Length: (cm) 4 Width: (cm) 4 Depth: (cm) 0.1 Volume: (cm) 1.257 Character of Wound/Ulcer Post Debridement: Requires Further Debridement Severity of Tissue Post Debridement: Fat layer exposed Post Procedure Diagnosis Same as Pre-procedure Electronic Signature(s) Signed: 08/05/2019 4:51:07 PM By: Linton Ham MD Signed: 08/05/2019 5:01:16 PM By: [name], [name], RN, CWS, [name], [name] Entered By: Linton Ham on 08/05/2019 13:41:03 [place], [name] -------------------------------------------------------------------------------- Debridement Details Patient Name: [name], [name]. Date of Service: 08/05/2019 12:45 PM Medical Record Number: [id number] Patient Account Number: [id number] Date of Birth/Sex: 03-May-1946 (73 y.o. F) Treating RN: Cornell Barman Primary Care Provider: [name] Other Clinician: Referring Provider: [name] Treating Provider/Extender: Tito Dine in  Treatment: 34 Debridement Performed for Wound #9 Left,Lateral,Posterior Lower Leg Assessment: Performed By: Physician [name] Dillon, MD Debridement Type: Debridement Severity of Tissue Pre Fat layer exposed Debridement: Level of Consciousness (Pre- Awake and Alert procedure): Pre-procedure Verification/Time Yes - 13:20 Out Taken: Start Time: 13:21 Pain Control: Lidocaine Total Area Debrided (L x W): 4.5 (cm) x 4 (cm) = 18 (cm) Tissue and other material Viable, Non-Viable, Slough, Subcutaneous, Slough debrided: Level: Skin/Subcutaneous Tissue Debridement Description: Excisional Instrument: Curette Bleeding: Minimum Hemostasis Achieved: Pressure End Time: 13:30 Response to Treatment: Procedure was tolerated well Level of Consciousness  Awake and Alert (Post-procedure): Post Debridement Measurements of Total Wound Length: (cm) 4 Width: (cm) 4.5 Depth: (cm) 0.1 Volume: (cm) 1.414 Character of Wound/Ulcer Post Debridement: Requires Further Debridement Severity of Tissue Post Debridement: Fat layer exposed Post Procedure Diagnosis Same as Pre-procedure Electronic Signature(s) Signed: 08/05/2019 4:51:07 PM By: Linton Ham MD Signed: 08/05/2019 5:01:16 PM By: [name], [name], RN, CWS, [name], [name] Entered By: Linton Ham on 08/05/2019 13:41:29 [place], [name] -------------------------------------------------------------------------------- HPI Details Patient Name: [name], [name]. Date of Service: 08/05/2019 12:45 PM Medical Record Number: [id number] Patient Account Number: [id number] Date of Birth/Sex: 1946/01/07 (73 y.o. F) Treating RN: Cornell Barman Primary Care Provider: [name] Other Clinician: Referring Provider: [name] Treating Provider/Extender: Tito Dine in Treatment: 18 History of Present Illness HPI Description: Pleasant 73 year old with history of chronic venous insufficiency. No diabetes or peripheral  vascular disease. Left ABI 1.29. Questionable history of left lower extremity DVT. She developed a recurrent ulceration on her left lateral calf in December 2015, which she attributes to poor diet and subsequent lower extremity edema. She underwent endovenous laser ablation of her left greater saphenous vein in 2010. She underwent laser ablation of accessory branch of left GSV in April 2016 by Dr. Kellie Simmering at [facility]. She was previously wearing Unna boots, which she tolerated well. Tolerating 2 layer compression and cadexomer iodine. She returns to clinic for follow-up and is without new complaints. She denies any significant pain at this time. She reports persistent pain with pressure. No claudication or ischemic rest pain. No fever or chills. No drainage. READMISSION 11/13/16; this is a 73 year old woman who is not a diabetic. She is here for a review of a painful area on her left medial lower extremity. I note that she was seen here previously last year for wound I believe to be in the same area. At that time she had undergone previously a left greater saphenous vein ablation by Dr. Kellie Simmering and she had a ablation of the anterior accessory branch of the left greater saphenous vein in March 2016. Seeing that the wound actually closed over. In reviewing the history with her today the ulcer in this area has been recurrent. She describes a biopsy of this area in 2009 that only showed stasis physiology. 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 Do not see that she has ever had arterial studies in the left leg. She tells me she has a follow-up with Dr. Kellie Simmering in roughly 10 days In any case she developed the reopening of this area roughly a month ago. On the background of this she  describes rapidly increasing edema which has responded to Lasix 40 mg and metolazone 2.5 mg as well as the patient's lymph massage. She has been told she has both venous insufficiency and lymphedema but she cannot tolerate compression stockings 11/28/16; the patient saw Dr. Kellie Simmering recently. Per the patient he did arterial Dopplers in the office that did not show evidence of arterial insufficiency, per the patient he stated "treat this like an ordinary venous ulcer". She also saw her dermatologist Dr. Ronnald Ramp who felt that this was more of a vascular ulcer. In general things are improving although she arrives today with increasing bilateral lower extremity edema with weeping a deeper fluid through  the wound on the left medial leg compatible with some degree of lymphedema 12/04/16; the patient's wound is fully epithelialized but I don't think fully healed. We will do another week of depression with Promogran and TCA however I suspect we'll be able to discharge her next week. This is a very unusual-looking wound which was initially a figure-of-eight type wound lying on its side surrounded by petechial like hemorrhage. She has had venous ablation on this side. She apparently does not have an arterial issue per Dr. Kellie Simmering. She saw her dermatologist thought it was "vascular". Patient is definitely going to need ongoing compression and I talked about this with her today she will go to elastic therapy after she leaves here next week 12/11/16; the patient's wound is not completely closed today. She has surrounding scar tissue and in further discussion with the patient it would appear that she had ulcers in this area in 2009 for a prolonged period of time ultimately requiring a punch biopsy of this area that only showed venous insufficiency. I did not previously pickup on this part of the history from the patient. 12/18/16; the patient's wound is completely epithelialized. There is no open area here. She has  significant bilateral venous insufficiency with secondary lymphedema to a mild-to-moderate degree she does not have compression stockings.. She did not say anything to me when I was in the room, she told our intake nurse that she was still having pain in this area. This isn't unusual recurrent small open area. She is going to go to elastic therapy to obtain compression stockings. 12/25/16; the patient's wound is fully epithelialized. There is no open area here. The patient describes some continued episodic discomfort in this area medial left calf. However everything looks fine and healed here. She is been to elastic therapy and caught herself 15-20 mmHg stockings, they apparently were having trouble getting 20-30 mm stockings in her size ANALAURA, BESTER ([id number]) 01/22/17; this is a patient we discharged from the clinic a month ago. She has a recurrent open wound on her medial left calf. She had 15 mm support stockings. I told her I thought she needed 20-30 mm compression stockings. She tells me that she has been ill with hospitalization secondary to asthma and is been found to have severe hypokalemia likely secondary to a combination of Lasix and metolazone. This morning she noted blistering and leaking fluid on the posterior part of her left leg. She called our intake nurse urgently and we was saw her this afternoon. She has not had any real discomfort here. I don't know that she's been wearing any stockings on this leg for at least 2-3 days. ABIs in this clinic were 1.21 on the right and 1.3 on the left. She is previously seen vascular surgery who does not think that there is a peripheral arterial issue. 01/30/17; Patient arrives with no open wound on the left leg. She has been to elastic therapy and obtained 20-58mmhg below knee stockings and she has one on the right leg today. READMISSION 02/19/18; this Widdison is a now 73 year old patient we've had in this clinic perhaps 3 times before. I had  last looked at her from January 07 December 2016 with an area on the medial left leg. We discharged her on 12/25/16 however she had to be readmitted on 01/22/17 with a recurrence. I have in my notes that we discharged her on 20-30 mm stockings although she tells me she was only wearing support hose because she cannot get  stockings on predominantly related to her cervical spine surgery/issues. She has had previous ablations done by vein and vascular in [place] including a great saphenous vein ablation on the left with an anterior accessory branch ablation I think both of these were in 2016. On one of the previous visit she had a biopsy noted 2009 that was negative. She is not felt to have an arterial issue. She is not a diabetic. She does have a history of obstructive sleep apnea hypertension asthma as well as chronic venous insufficiency and lymphedema. On this occasion she noted 2 dry scaly patch on her left leg. She tried to put lotion on this it didn't really help. There were 2 open areas.the patient has been seeing her primary physician from 02/05/18 through 02/14/18. She had Unna boots applied. The superior wound now on the lateral left leg has closed but she's had one wound that remains open on the lateral left leg. This is not the same spot as we dealt with in 2018. ABIs in this clinic were 1.3 bilaterally 02/26/18; patient has a small wound on the left lateral calf. Dimensions are down. She has chronic venous insufficiency and lymphedema. 03/05/18; small open area on the left lateral calf. Dimensions are down. Tightly adherent necrotic debris over the surface of the wound which was difficult to remove. Also the dressing [over collagen] stuck to the wound surface. This was removed with some difficulty as well. Change the primary dressing to Hydrofera Blue ready 03/12/18; small open area on the left lateral calf. Comes in with tightly adherent surface eschar as well as some adherent Hydrofera  Blue. 03/19/18; open area on the left lateral calf. Again adherent surface eschar as well as some adherent Hydrofera Blue nonviable subcutaneous tissue. She complained of pain all week even with the reduction from 4-3 layer compression I put on last week. Also she had an increase in her ankle and calf measurements probably related to the same thing. 03/26/18; open area on the left lateral calf. A very small open area remains here. We used silver alginate starting last week as the Hydrofera Blue seem to stick to the wound bed. In using 4-layer compression 04/02/18; the open area in the left lateral calf at some adherent slough which I removed there is no open area here. We are able to transition her into her own compression stocking. Truthfully I think this is probably his support hose. However this does not maintain skin integrity will be limited. She cannot put over the toe compression stockings on because of neck problems hand problems etc. She is allergic to the lining layer of juxta lites. We might be forced to use extremitease stocking should this fail READMIT 11/24/2018 Patient is now a 73 year old woman who is not a diabetic. She has been in this clinic on at least 3 previous occasions largely with recurrent wounds on her left leg secondary to chronic venous insufficiency with secondary lymphedema. Her situation is complicated by inability to get stockings on and an allergy to neoprene which is apparently a component and at least juxta lites and other stockings. As a result she really has not been wearing any stockings on her legs. She tells [place] that roughly 2 or 3 weeks ago she started noticing a stinging sensation just above her ankle on the left medial aspect. She has been diagnosed with pseudogout and she wondered whether this was what she was experiencing. She tried to dress this with something she bought at the store however subsequently  it pulled skin off and now she has an open  wound that is not improving. She has been using Vaseline gauze with a cover bandage. She saw her primary doctor last week who put an Haematologist on her. ABIs in this clinic was 1.03 on the left [name], [name]. ([id number]) 2/12; the area is on the left medial ankle. Odd-looking wound with what looks to be surface epithelialization but a multitude of small petechial openings. This clearly not closed yet. We have been using silver alginate under 3 layer compression with TCA 2/19; the wound area did not look quite as good this week. Necrotic debris over the majority of the wound surface which required debridement. She continues to have a multitude of what looked to be small petechial openings. She reminds [place] that she had a biopsy on this initially during her first outbreak in 2015 in [place] [facility]. She expresses concern about this being a possible melanoma. 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 I assured her that this does not look anything like melanoma. Except for the petechial reaction it does look like a venous insufficiency area and she certainly has evidence of this on both sides 2/26; a difficult area on the left medial ankle. The patient clearly has chronic venous hypertension with some degree of lymphedema. The odd thing about the area is the small petechial hemorrhages. I am not really sure how to explain this. This was present last time and this is not a compression injury. We have been using Hydrofera Blue which I changed to last week 3/4; still using Hydrofera Blue. Aggressive debridement today. She does not have known arterial issues. She has seen Dr. Kellie Simmering at [facility] vein and vascular and and has an ablation on the left. [Anterior accessory branch of the greater saphenous]. From what I remember they did not feel she had an arterial issue. The patient has had this area biopsied in  2009 at [facility] dermatology and by her recollection they said this was "stasis". She is also follow-up with dermatology locally who thought that this was more of a vascular issue 3/11; using Hydrofera Blue. Aggressive debridement today. She does not have an arterial issue. We are using 3 layer compression although we may need to go to 4. The patient has been in for multiple changes to her wrap since I last saw her a week ago. She says that the area was leaking. I do not have too much more information on what was found 01/19/19 on evaluation today patient was actually being seen for a nurse visit when unfortunately she had the area on her left lateral lower extremity as well as weeping from the right lower extremity that became apparent. Therefore we did end up actually seeing her for a full visit with myself. She is having some pain at this site as well but fortunately nothing too significant at this point. No fevers, chills, nausea, or vomiting noted at this time. 3/18-Patient is back to the clinic with the left leg venous leg ulcer, the ulcer is larger in size, has a surface that is densely adherent with fibrinous tissue, the Hydrofera Blue was used but is densely adherent and there was difficulty in removing it. The right lower extremity was also wrapped for weeping edema. Patient has a new area over the left lateral foot above the malleolus that is small and appears to have no debris with intact surrounding skin.  Patient is on increased dose of Lasix also as a means to edema management 3/25; the patient has a nonhealing venous ulcer on the medial left leg and last week developed a smaller area on the lateral left calf. We have been using Hydrofera Blue with a contact layer. 4/1; no major change in these wounds areas. Left medial and more recently left lateral calf. I tried Iodoflex last week to aid in debridement she did not tolerate this. She stated her pain was terrible all week. She took the  top layer of the 4 layer compression off. 4/8; the patient actually looks somewhat better in terms of her more prominent left lateral calf wound. There is some healthy looking tissue here. She is still complaining of a lot of discomfort. 4/15; patient in a lot of pain secondary to sciatica. She is on a prednisone taper prescribed by her primary physician. She has the 2 areas one on the left medial and more recently a smaller area on the left lateral calf. Both of these just above the malleoli 4/22; her back pain is better but she still states she is very uncomfortable and now feels she is intolerant to the [employer]. No real change in the wounds we have been using Sorbact. She has been previously intolerant to Iodoflex. There is not a lot of option about what we can use to debride this wound under compression that she no doubt needs. sHe states Ultram no longer works for her pain 4/29; no major change in the wounds slightly increased depth. Surface on the original medial wound perhaps somewhat improved however the more recent area on the lateral left ankle is 100% covered in very adherent debris we have been using Sorbact. She tolerates 4 layer compression well and her edema control is a lot better. She has not had to come in for a nurse check 5/6; no major change in the condition of the wounds. She did consent to debridement today which was done with some difficulty. Continuing Sorbact. She did not tolerate Iodoflex. She was in for a check of her compression the day after we wrapped her last week this was adjusted but nothing much was found 5/13; no major change in the condition or area of the wounds. I was able to get a fairly aggressive debridement done on the lateral left leg wound. Even using Sorbact under compression. She came back in on Friday to have the wrap changed. She says she felt uncomfortable on the lateral aspect of her ankle. She has a long history of chronic venous insufficiency  including previous ablation surgery on this side. 5/20-Patient returns for wounds on left leg with both wounds covered in slough, with the lateral leg wound larger in size, she has been in 3 layer compression and felt more comfortable, she describes pain in ankle, in leg and pins and needles in foot, and is about to try Pamelor for this 6/3; wounds on the left lateral and left medial leg. The area medially which is the most recent of the 2 seems to have had the largest increase in dimensions. We have been using Sorbac to try and debride the surface. She has been to see orthopedics they apparently did a plain x-ray that was indeterminant. Diagnosed her with neuropathy and they have ordered an MRI to [name], [name]. ([id number]) determine if there is underlying osteomyelitis. This was not high on my thought list but I suppose it is prudent. We have advised her to make an appointment  with vein and vascular in [place]. She has a history of a left greater saphenous and accessory vein ablations I wonder if there is anything else that can be done from a surgical point of view to help in these difficult refractory wounds. We have previously healed this wound on one occasion but it keeps on reopening [medial side] 6/10; deep tissue culture I did last week I think on the left medial wound showed both moderate E. coli and moderate staph aureus [MSSA]. She is going to require antibiotics and I have chosen Augmentin. We have been using Sorbact and we have made better looking wound surface on both sides but certainly no improvement in wound area. She was back in last Friday apparently for a dressing changes the wrap was hurting her outer left ankle. She has not managed to get a hold of vein and vascular in [place]. We are going to have to make her that appointment 6/17; patient is tolerating the Augmentin. She had an MRI that I think was ordered by orthopedic surgeon this did not show osteomyelitis or an  abscess did suggest cellulitis. We have been using Sorbact to the lateral and medial ankles. We have been trying to arrange a follow-up appointment with vein and vascular in [place] or did her original ablations. We apparently an area sent the request to vein and vascular in [facility] 6/24; patient has completed the Augmentin. We do not yet have a vein and vascular appointment in [place]. I am not sure what the issue is here we have asked her to call tomorrow. We are using Sorbact. Making some improvements and especially the medial wound. Both surfaces however look better medial and lateral. 7/1; the patient has been in contact with vein and vascular in [place] but has not yet received an appointment. Using Sorbact we have gradually improve the wound surface with no improvement in surface area. She is approved for Apligraf but the wound surface still is not completely viable. She has not had to come in for a dressing change 7/8; the patient has an appointment with vein and vascular on 7/31 which is a Friday afternoon. She is concerned about getting back here for [place] to dress her wounds. I think it is important to have them goal for her venous reflux/history of ablations etc. to see if anything else can be done. She apparently tested positive for 1 of the blood tests with regards to lupus and saw a rheumatologist. He has raised the issue of vasculitis again. I have had this thought in the past however the evidence seems overwhelming that this is a venous reflux etiology. If the rheumatologist tells me there is clinical and laboratory investigation is positive for lupus I will rethink this. 7/15; the patient's wound surfaces are quite a bit better. The medial area which was her original wound now has no depth although the lateral wound which was the more recent area actually appears larger. Both with viable surfaces which is indeed better. Using Sorbact. I wanted to use Apligraf on her  however there is the issue of the vein and vascular appointment on 7/31 at 2:00 in the afternoon which would not allow her to get back to be rewrapped and they would no doubt remove the graft 7/22; the patient's wound surfaces have moderate amount of debris although generally look better. The lateral one is larger with 2 small satellite areas superiorly. We are waiting for her vein and vascular appointment on 7/31. She has been approved for Apligraf  which I would like to use after th 7/29; wound surfaces have improved no debridement is required we have been using Sorbact. She sees vein and vascular on Friday with this so question of whether anything can be done to lessen the likelihood of recurrence and/or speed the healing of these areas. She is already had previous ablations. She no doubt has severe venous hypertension 8/5-Patient returns at 1 week, she was in [place] for 3 days by her podiatrist, we have been using so backed to the wound, she has increased pain in both the wounds on the left lower leg especially the more distal one on the lateral aspect 8/12-Patient returns at 1 week and she is agreeable to having debridement in both wounds on her left leg today. We have been using Sorbact, and vascular studies were reviewed at last visit 8/19; the patient arrives with her wounds fairly clean and no debridement is required. We have used Sorbact which is really done a nice job in cleaning up these very difficult wound surfaces. The patient saw Dr. Donzetta Matters of vascular surgery on 7/31. He did not feel that there was an arterial component. He felt that her treated greater saphenous vein is adequately addressed and that the small saphenous vein did not appear to be involved significantly. She was also noted to have deep venous reflux which is not treatable. Dr. Donzetta Matters mentioned the possibility of a central obstructive component leading to reflux and he offered her central venography. She wanted to  discuss this or think about it. I have urged her to go ahead with this. She has had recurrent difficult wounds in these areas which do heal but after months in the clinic. If there is anything that can be done to reduce the likelihood of this I think it is worth it. 9/2 she is still working towards getting follow-up with Dr. Donzetta Matters to schedule her CT. Things are quite a bit worse venography. I put Apligraf on 2 weeks ago on both wounds on the medial and lateral part of her left lower leg. She arrives in clinic today with 3 superficial additional wounds above the area laterally and one below the wound medially. She describes a lot of discomfort. I think these are probably wrapped injuries. Does not look like she has cellulitis. 07/20/2019 on evaluation today patient appears to be doing somewhat poorly in regard to her lower extremity ulcers. She in fact showed signs of erythema in fact we may even be dealing with an infection at this time. Unfortunately I am unsure if this is just infection or if indeed there may be some allergic reaction that occurred as a result of the Apligraf application. With that being said that would be unusual but nonetheless not impossible in this patient is one who is unfortunately allergic to quite a bit. Currently we have been using the Sorbact which seems to do as well as anything for her. I do think we may want to obtain a culture today to see if there is anything showing up there that may need to be addressed. [name], [name] 9/16; noted that last week the wounds look worse in 1 week follow-up of the Apligraf. Using Sorbact as of 2 days ago. She arrives with copious amounts of drainage and new skin breakdown on the back of the left calf. The wounds arm more substantial bilaterally. There is a fair amount of swelling in the left calf no overt DVT there is edema present I think in the  left greater than right thigh. She is supposed to go on 9/28 for CT  venography. The wounds on the medial and lateral calf are worse and she has new skin breakdown posteriorly at least new for me. This is almost developing into a circumferential wound area The Apligraf was taken off last week which I agree with things are not going in the right direction a culture was done we do not have that back yet. She is on Augmentin that she started 2 days ago 9/23; dressing was changed by her nurses on Monday. In general there is no improvement in the wound areas although the area looks less angry than last week. She did get Augmentin for MSSA cultured on the 14th. She still appears to have too much swelling in the left leg even with 3 layer compression 9/30; the patient underwent her procedure on 9/28 by Dr. Donzetta Matters at [facility]. She was discovered to have the common iliac vein measuring 12.2 mm but at the level of L4-L5 measured 3 mm. After stenting it measured 10 mm. 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 Rouleaux flow in the common femoral and femoral vein was observed much improved after stenting. We are using silver alginate to the wounds on the medial and lateral ankle on the left. 4 layer compression Electronic Signature(s) Signed: 08/05/2019 4:51:07 PM By: Linton Ham MD Entered By: Linton Ham on 08/05/2019 13:43:38 [place], [name] -------------------------------------------------------------------------------- Physical Exam Details Patient Name: [name], [name]. Date of Service: 08/05/2019 12:45 PM Medical Record Number: [id number] Patient Account Number: [id number] Date of Birth/Sex: 1946/08/07 (73 y.o. F) Treating RN: Cornell Barman Primary Care Provider: [name] Other Clinician: Referring Provider: [name] Treating Provider/Extender: Tito Dine in Treatment: 42 Constitutional Patient is hypertensive.. Pulse regular and within target range for patient.Marland Kitchen Respirations  regular, non-labored and within target range.. Temperature is normal and within the target range for the patient.Marland Kitchen appears in no distress. Eyes Conjunctivae clear. No discharge. Respiratory Respiratory effort is easy and symmetric bilaterally. Rate is normal at rest and on room air.. Cardiovascular Pedal pulses palpable. Excellent edema control. Integumentary (Hair, Skin) No erythema around the wound. The degree of stasis dermatitis/venous inflammation in the area is a lot better.Marland Kitchen Psychiatric No evidence of depression, anxiety, or agitation. Calm, cooperative, and communicative. Appropriate interactions and affect.. Notes Wound exam; the 2 original wounds are the left medial and left lateral ankle area. More extensive skin breakdown extends into the lower calf. The 2 major wounds were debrided to remove tightly adherent surface necrotic material also the new areas on the left lateral. She tolerates this marginally well Electronic Signature(s) Signed: 08/05/2019 4:51:07 PM By: Linton Ham MD Entered By: Linton Ham on 08/05/2019 13:45:31 Akram, [name] -------------------------------------------------------------------------------- Physician Orders Details Patient Name: [name], NATALIE. Date of Service: 08/05/2019 12:45 PM Medical Record Number: [id number] Patient Account Number: [id number] Date of Birth/Sex: [birth year] (73 y.o. F) Treating RN: Cornell Barman Primary Care Provider: [name] Other Clinician: Referring Provider: [name] Treating Provider/Extender: Tito Dine in Treatment: 31 Verbal / Phone Orders: No Diagnosis Coding ICD-10 Coding Code Description L97.221 Non-pressure chronic ulcer of left calf limited to breakdown of skin I87.321 Chronic venous hypertension (idiopathic) with inflammation of right lower extremity I89.0 Lymphedema, not elsewhere classified Wound Cleansing Wound #5 Left,Medial Lower Leg o Clean wound  with Normal Saline. o May Shower, gently pat wound dry prior to applying new dressing. Wound #6 Left,Lateral  Lower Leg o Clean wound with Normal Saline. o May Shower, gently pat wound dry prior to applying new dressing. Wound #9 Left,Lateral,Posterior Lower Leg o Clean wound with Normal Saline. o May Shower, gently pat wound dry prior to applying new dressing. Anesthetic (add to Medication List) Wound #5 Left,Medial Lower Leg o Topical Lidocaine 4% cream applied to wound bed prior to debridement (In Clinic Only). o Benzocaine Topical Anesthetic Spray applied to wound bed prior to debridement (In Clinic Only). Wound #6 Left,Lateral Lower Leg o Topical Lidocaine 4% cream applied to wound bed prior to debridement (In Clinic Only). o Benzocaine Topical Anesthetic Spray applied to wound bed prior to debridement (In Clinic Only). Wound #9 Left,Lateral,Posterior Lower Leg o Topical Lidocaine 4% cream applied to wound bed prior to debridement (In Clinic Only). o Benzocaine Topical Anesthetic Spray applied to wound bed prior to debridement (In Clinic Only). Skin Barriers/Peri-Wound Care Wound #5 Left,Medial Lower Leg o Barrier cream - Zinc oxide to periwound area Wound #6 Left,Lateral Lower Leg o Barrier cream - Zinc oxide to periwound area Wound #9 Left,Lateral,Posterior Lower Leg o Barrier cream - Zinc oxide to periwound area Primary Wound Dressing DELOISE, BROSIUS ([id number]) Wound #5 Left,Medial Lower Leg o Silver Alginate Wound #6 Left,Lateral Lower Leg o Silver Alginate Wound #9 Left,Lateral,Posterior Lower Leg o Silver Alginate Secondary Dressing Wound #5 Left,Medial Lower Leg o ABD pad o Drawtex Wound #6 Left,Lateral Lower Leg o ABD pad o Drawtex Wound #9 Left,Lateral,Posterior Lower Leg o ABD pad o Drawtex Dressing Change Frequency Wound #5 Left,Medial Lower Leg o Change dressing every week Wound #6 Left,Lateral Lower  Leg o Change dressing every week Wound #9 Left,Lateral,Posterior Lower Leg o Change dressing every week Follow-up Appointments Wound #5 Left,Medial Lower Leg o Return Appointment in 1 week. o Nurse Visit as needed Wound #6 Left,Lateral Lower Leg o Return Appointment in 1 week. o Nurse Visit as needed Wound #9 Left,Lateral,Posterior Lower Leg o Return Appointment in 1 week. o Nurse Visit as needed Edema Control Wound #5 Left,Medial Lower Leg o 4-Layer Compression System - Left Lower Extremity. Wound #6 Left,Lateral Lower Leg o 4-Layer Compression System - Left Lower Extremity. Wound #9 Left,Lateral,Posterior Lower Leg o 4-Layer Compression System - Left Lower Extremity. CORALINA, SACKETT ([id number]) Off-Loading Wound #5 Left,Medial Lower Leg o Other: - Elevate legs as needed Wound #6 Left,Lateral Lower Leg o Other: - Elevate legs as needed Wound #9 Left,Lateral,Posterior Lower Leg o Other: - Elevate legs as needed Electronic Signature(s) Signed: 08/05/2019 4:51:07 PM By: Linton Ham MD Signed: 08/05/2019 5:01:16 PM By: [name], [name], RN, CWS, [name], [name] Entered By: [name], [name], RN, CWS, Kim on 08/05/2019 13:30:26 JESSILYN, LORENSON ([id number]) -------------------------------------------------------------------------------- Problem List Details Patient Name: JAMI, BORBON. Date of Service: 08/05/2019 12:45 PM Medical Record Number: [id number] Patient Account Number: [id number] Date of Birth/Sex: Sep 16, 1946 (73 y.o. F) Treating RN: Cornell Barman Primary Care Provider: [name] Other Clinician: Referring Provider: [name] Treating Provider/Extender: Tito Dine in Treatment: 34 Active Problems ICD-10 Evaluated Encounter Code Description Active Date Today Diagnosis L97.221 Non-pressure chronic ulcer of left calf limited to breakdown of 01/07/2019 No Yes skin I87.321 Chronic venous hypertension (idiopathic) with  inflammation of 12/10/2018 No Yes right lower extremity I89.0 Lymphedema, not elsewhere classified 12/10/2018 No Yes Inactive Problems Resolved Problems Electronic Signature(s) Signed: 08/05/2019 4:51:07 PM By: Linton Ham MD Entered By: Linton Ham on 08/05/2019 13:40:28 Schueller, [name] -------------------------------------------------------------------------------- Progress Note Details Patient Name: TAJA, WANTUCH.  Date of Service: 08/05/2019 12:45 PM Medical Record Number: [id number] Patient Account Number: [id number] Date of Birth/Sex: Mar 30, 1946 (73 y.o. F) Treating RN: Cornell Barman Primary Care Provider: [name] Other Clinician: Referring Provider: [name] Treating Provider/Extender: Tito Dine in Treatment: 34 Subjective History of Present Illness (HPI) Pleasant 73 year old with history of chronic venous insufficiency. No diabetes or peripheral vascular disease. Left ABI 1.29. Questionable history of left lower extremity DVT. She developed a recurrent ulceration on her left lateral calf in December 2015, which she attributes to poor diet and subsequent lower extremity edema. She underwent endovenous laser ablation of her left greater saphenous vein in 2010. She underwent laser ablation of accessory branch of left GSV in April 2016 by Dr. Kellie Simmering at [facility]. She was previously wearing Unna boots, which she tolerated well. Tolerating 2 layer compression and cadexomer iodine. She returns to clinic for follow-up and is without new complaints. She denies any significant pain at this time. She reports persistent pain with pressure. No claudication or ischemic rest pain. No fever or chills. No drainage. READMISSION 11/13/16; this is a 73 year old woman who is not a diabetic. She is here for a review of a painful area on her left medial lower extremity. I note that she was seen here previously last year for wound I believe to be in the same  area. At that time she had undergone previously a left greater saphenous vein ablation by Dr. Kellie Simmering and she had a ablation of the anterior accessory branch of the left greater saphenous vein in March 2016. Seeing that the wound actually closed over. In reviewing the history with her today the ulcer in this area has been recurrent. She describes a biopsy of this area in 2009 that only showed stasis physiology. 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 Do not see that she has ever had arterial studies in the left leg. She tells me she has a follow-up with Dr. Kellie Simmering in roughly 10 days In any case she developed the reopening of this area roughly a month ago. On the background of this she describes rapidly increasing edema which has responded to Lasix 40 mg and metolazone 2.5 mg as well as the patient's lymph massage. She has been told she has both venous insufficiency and lymphedema but she cannot tolerate compression stockings 11/28/16; the patient saw Dr. Kellie Simmering recently. Per the patient he did arterial Dopplers in the office that did not show evidence of arterial insufficiency, per the patient he stated "treat this like an ordinary venous ulcer". She also saw her dermatologist Dr. Ronnald Ramp who felt that this was more of a vascular ulcer. In general things are improving although she arrives today with increasing bilateral lower extremity edema with weeping a deeper fluid through the wound on the left medial leg compatible with some degree of lymphedema 12/04/16; the patient's wound is fully epithelialized but I don't think fully healed. We will do another week of depression with Promogran and TCA however I suspect we'll be able to discharge her next week. This is a very unusual-looking  wound which was initially a figure-of-eight type wound lying on its side surrounded by petechial like hemorrhage. She has had venous ablation on this side. She apparently does not have an arterial  issue per Dr. Kellie Simmering. She saw her dermatologist thought it was "vascular". Patient is definitely going to need ongoing compression and I talked about this with her today she will go to elastic therapy after she leaves here next week 12/11/16; the patient's wound is not completely closed today. She has surrounding scar tissue and in further discussion with the patient it would appear that she had ulcers in this area in 2009 for a prolonged period of time ultimately requiring a punch biopsy of this area that only showed venous insufficiency. I did not previously pickup on this part of the history from the patient. 12/18/16; the patient's wound is completely epithelialized. There is no open area here. She has significant bilateral venous insufficiency with secondary lymphedema to a mild-to-moderate degree she does not have compression stockings.. She did not say anything to me when I was in the room, she told our intake nurse that she was still having pain in this area. This isn't unusual recurrent small open area. She is going to go to elastic therapy to obtain compression stockings. 12/25/16; the patient's wound is fully epithelialized. There is no open area here. The patient describes some continued episodic discomfort in this area medial left calf. However everything looks fine and healed here. She is been to elastic therapy and [name], [name] caught herself 15-20 mmHg stockings, they apparently were having trouble getting 20-30 mm stockings in her size 01/22/17; this is a patient we discharged from the clinic a month ago. She has a recurrent open wound on her medial left calf. She had 15 mm support stockings. I told her I thought she needed 20-30 mm compression stockings. She tells me that  she has been ill with hospitalization secondary to asthma and is been found to have severe hypokalemia likely secondary to a combination of Lasix and metolazone. This morning she noted blistering and leaking fluid on the posterior part of her left leg. She called our intake nurse urgently and we was saw her this afternoon. She has not had any real discomfort here. I don't know that she's been wearing any stockings on this leg for at least 2-3 days. ABIs in this clinic were 1.21 on the right and 1.3 on the left. She is previously seen vascular surgery who does not think that there is a peripheral arterial issue. 01/30/17; Patient arrives with no open wound on the left leg. She has been to elastic therapy and obtained 20-94mmhg below knee stockings and she has one on the right leg today. READMISSION 02/19/18; this Wadhams is a now 73 year old patient we've had in this clinic perhaps 3 times before. I had last looked at her from January 07 December 2016 with an area on the medial left leg. We discharged her on 12/25/16 however she had to be readmitted on 01/22/17 with a recurrence. I have in my notes that we discharged her on 20-30 mm stockings although she tells me she was only wearing support hose because she cannot get stockings on predominantly related to her cervical spine surgery/issues. She has had previous ablations done by vein and vascular in [place] including a great saphenous vein ablation on the left with an anterior accessory branch ablation I think both of these were in 2016. On one of the previous visit she had a biopsy noted 2009 that was negative. She is not felt to have an arterial issue. She is not a diabetic. She does have a history of obstructive sleep apnea hypertension asthma as well  as chronic venous insufficiency and lymphedema. On this occasion she noted 2 dry scaly patch on her left leg. She tried to put lotion on this it didn't really help. There were 2 open areas.the patient  has been seeing her primary physician from 02/05/18 through 02/14/18. She had Unna boots applied. The superior wound now on the lateral left leg has closed but she's had one wound that remains open on the lateral left leg. This is not the same spot as we dealt with in 2018. ABIs in this clinic were 1.3 bilaterally 02/26/18; patient has a small wound on the left lateral calf. Dimensions are down. She has chronic venous insufficiency and lymphedema. 03/05/18; small open area on the left lateral calf. Dimensions are down. Tightly adherent necrotic debris over the surface of the wound which was difficult to remove. Also the dressing [over collagen] stuck to the wound surface. This was removed with some difficulty as well. Change the primary dressing to Hydrofera Blue ready 03/12/18; small open area on the left lateral calf. Comes in with tightly adherent surface eschar as well as some adherent Hydrofera Blue. 03/19/18; open area on the left lateral calf. Again adherent surface eschar as well as some adherent Hydrofera Blue nonviable subcutaneous tissue. She complained of pain all week even with the reduction from 4-3 layer compression I put on last week. Also she had an increase in her ankle and calf measurements probably related to the same thing. 03/26/18; open area on the left lateral calf. A very small open area remains here. We used silver alginate starting last week as the Hydrofera Blue seem to stick to the wound bed. In using 4-layer compression 04/02/18; the open area in the left lateral calf at some adherent slough which I removed there is no open area here. We are able to transition her into her own compression stocking. Truthfully I think this is probably his support hose. However this does not maintain skin integrity will be limited. She cannot put over the toe compression stockings on because of neck problems hand problems etc. She is allergic to the lining layer of juxta lites. We might be forced  to use extremitease stocking should this fail READMIT 11/24/2018 Patient is now a 73 year old woman who is not a diabetic. She has been in this clinic on at least 3 previous occasions largely with recurrent wounds on her left leg secondary to chronic venous insufficiency with secondary lymphedema. Her situation is complicated by inability to get stockings on and an allergy to neoprene which is apparently a component and at least juxta lites and other stockings. As a result she really has not been wearing any stockings on her legs. She tells [place] that roughly 2 or 3 weeks ago she started noticing a stinging sensation just above her ankle on the left medial aspect. She has been diagnosed with pseudogout and she wondered whether this was what she was experiencing. She tried to dress this with something she bought at the store however subsequently it pulled skin off and now she has an open wound that is not improving. She has been using Vaseline gauze with a cover bandage. She saw her primary doctor last week who put an Haematologist on her. ABIs in this clinic was 1.03 on the left [name], [name]. ([id number]) 2/12; the area is on the left medial ankle. Odd-looking wound with what looks to be surface epithelialization but a multitude of small petechial openings. This clearly not closed yet. We have been  using silver alginate under 3 layer compression with TCA 2/19; the wound area did not look quite as good this week. Necrotic debris over the majority of the wound surface which required debridement. She continues to have a multitude of what looked to be small petechial openings. She reminds [place] that she had a biopsy on this initially during her first outbreak in 2015 in [place] [facility]. She expresses concern about this being a possible melanoma. 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 I assured her that this  does not look anything like melanoma. Except for the petechial reaction it does look like a venous insufficiency area and she certainly has evidence of this on both sides 2/26; a difficult area on the left medial ankle. The patient clearly has chronic venous hypertension with some degree of lymphedema. The odd thing about the area is the small petechial hemorrhages. I am not really sure how to explain this. This was present last time and this is not a compression injury. We have been using Hydrofera Blue which I changed to last week 3/4; still using Hydrofera Blue. Aggressive debridement today. She does not have known arterial issues. She has seen Dr. Kellie Simmering at [facility] vein [facility] vascular and and has an ablation on the left. [Anterior accessory branch of the greater saphenous]. From what I remember they did not feel she had an arterial issue. The patient has had this area biopsied in 2009 at [facility] dermatology and by her recollection they said this was "stasis". She is also follow-up with dermatology locally who thought that this was more of a vascular issue 3/11; using Hydrofera Blue. Aggressive debridement today. She does not have an arterial issue. We are using 3 layer compression although we may need to go to 4. The patient has been in for multiple changes to her wrap since I last saw her a week ago. She says that the area was leaking. I do not have too much more information on what was found 01/19/19 on evaluation today patient was actually being seen for a nurse visit when unfortunately she had the area on her left lateral lower extremity as well as weeping from the right lower extremity that became apparent. Therefore we did end up actually seeing her for a full visit with myself. She is having some pain at this site as well but fortunately nothing too significant at this point. No fevers, chills, nausea, or vomiting noted at this time. 3/18-Patient is back to the clinic with the left leg  venous leg ulcer, the ulcer is larger in size, has a surface that is densely adherent with fibrinous tissue, the Hydrofera Blue was used but is densely adherent and there was difficulty in removing it. The right lower extremity was also wrapped for weeping edema. Patient has a new area over the left lateral foot above the malleolus that is small and appears to have no debris with intact surrounding skin. Patient is on increased dose of Lasix also as a means to edema management 3/25; the patient has a nonhealing venous ulcer on the medial left leg and last week developed a smaller area on the lateral left calf. We have been using Hydrofera Blue with a contact layer. 4/1; no major change in these wounds areas. Left medial and more recently left lateral calf. I tried Iodoflex last week to aid in debridement she did not tolerate this. She stated her pain was terrible  all week. She took the top layer of the 4 layer compression off. 4/8; the patient actually looks somewhat better in terms of her more prominent left lateral calf wound. There is some healthy looking tissue here. She is still complaining of a lot of discomfort. 4/15; patient in a lot of pain secondary to sciatica. She is on a prednisone taper prescribed by her primary physician. She has the 2 areas one on the left medial and more recently a smaller area on the left lateral calf. Both of these just above the malleoli 4/22; her back pain is better but she still states she is very uncomfortable and now feels she is intolerant to the [employer]. No real change in the wounds we have been using Sorbact. She has been previously intolerant to Iodoflex. There is not a lot of option about what we can use to debride this wound under compression that she no doubt needs. sHe states Ultram no longer works for her pain 4/29; no major change in the wounds slightly increased depth. Surface on the original medial wound perhaps somewhat improved however the  more recent area on the lateral left ankle is 100% covered in very adherent debris we have been using Sorbact. She tolerates 4 layer compression well and her edema control is a lot better. She has not had to come in for a nurse check 5/6; no major change in the condition of the wounds. She did consent to debridement today which was done with some difficulty. Continuing Sorbact. She did not tolerate Iodoflex. She was in for a check of her compression the day after we wrapped her last week this was adjusted but nothing much was found 5/13; no major change in the condition or area of the wounds. I was able to get a fairly aggressive debridement done on the lateral left leg wound. Even using Sorbact under compression. She came back in on Friday to have the wrap changed. She says she felt uncomfortable on the lateral aspect of her ankle. She has a long history of chronic venous insufficiency including previous ablation surgery on this side. 5/20-Patient returns for wounds on left leg with both wounds covered in slough, with the lateral leg wound larger in size, she has been in 3 layer compression and felt more comfortable, she describes pain in ankle, in leg and pins and needles in foot, and is about to try Pamelor for this 6/3; wounds on the left lateral and left medial leg. The area medially which is the most recent of the 2 seems to have had the largest increase in dimensions. We have been using Sorbac to try and debride the surface. She has been to see orthopedics [name], [name] they apparently did a plain x-ray that was indeterminant. Diagnosed her with neuropathy and they have ordered an MRI to determine if there is underlying osteomyelitis. This was not high on my thought list but I suppose it is prudent. We have advised her to make an appointment with vein and vascular in [place]. She has a history of a left greater saphenous and accessory vein ablations I wonder if there is  anything else that can be done from a surgical point of view to help in these difficult refractory wounds. We have previously healed this wound on one occasion but it keeps on reopening [medial side] 6/10; deep tissue culture I did last week I think on the left medial wound showed both moderate E. coli and moderate staph aureus [MSSA]. She  is going to require antibiotics and I have chosen Augmentin. We have been using Sorbact and we have made better looking wound surface on both sides but certainly no improvement in wound area. She was back in last Friday apparently for a dressing changes the wrap was hurting her outer left ankle. She has not managed to get a hold of vein and vascular in [place]. We are going to have to make her that appointment 6/17; patient is tolerating the Augmentin. She had an MRI that I think was ordered by orthopedic surgeon this did not show osteomyelitis or an abscess did suggest cellulitis. We have been using Sorbact to the lateral and medial ankles. We have been trying to arrange a follow-up appointment with vein and vascular in [place] or did her original ablations. We apparently an area sent the request to vein and vascular in [facility] 6/24; patient has completed the Augmentin. We do not yet have a vein and vascular appointment in [place]. I am not sure what the issue is here we have asked her to call tomorrow. We are using Sorbact. Making some improvements and especially the medial wound. Both surfaces however look better medial and lateral. 7/1; the patient has been in contact with vein and vascular in [place] but has not yet received an appointment. Using Sorbact we have gradually improve the wound surface with no improvement in surface area. She is approved for Apligraf but the wound surface still is not completely viable. She has not had to come in for a dressing change 7/8; the patient has an appointment with vein and vascular on 7/31 which is a  Friday afternoon. She is concerned about getting back here for [place] to dress her wounds. I think it is important to have them goal for her venous reflux/history of ablations etc. to see if anything else can be done. She apparently tested positive for 1 of the blood tests with regards to lupus and saw a rheumatologist. He has raised the issue of vasculitis again. I have had this thought in the past however the evidence seems overwhelming that this is a venous reflux etiology. If the rheumatologist tells me there is clinical and laboratory investigation is positive for lupus I will rethink this. 7/15; the patient's wound surfaces are quite a bit better. The medial area which was her original wound now has no depth although the lateral wound which was the more recent area actually appears larger. Both with viable surfaces which is indeed better. Using Sorbact. I wanted to use Apligraf on her however there is the issue of the vein and vascular appointment on 7/31 at 2:00 in the afternoon which would not allow her to get back to be rewrapped and they would no doubt remove the graft 7/22; the patient's wound surfaces have moderate amount of debris although generally look better. The lateral one is larger with 2 small satellite areas superiorly. We are waiting for her vein and vascular appointment on 7/31. She has been approved for Apligraf which I would like to use after th 7/29; wound surfaces have improved no debridement is required we have been using Sorbact. She sees vein and vascular on Friday with this so question of whether anything can be done to lessen the likelihood of recurrence and/or speed the healing of these areas. She is already had previous ablations. She no doubt has severe venous hypertension 8/5-Patient returns at 1 week, she was in [employer] for 3 days by her podiatrist, we have been using so  backed to the wound, she has increased pain in both the wounds on the left lower leg especially  the more distal one on the lateral aspect 8/12-Patient returns at 1 week and she is agreeable to having debridement in both wounds on her left leg today. We have been using Sorbact, and vascular studies were reviewed at last visit 8/19; the patient arrives with her wounds fairly clean and no debridement is required. We have used Sorbact which is really done a nice job in cleaning up these very difficult wound surfaces. The patient saw Dr. Donzetta Matters of vascular surgery on 7/31. He did not feel that there was an arterial component. He felt that her treated greater saphenous vein is adequately addressed and that the small saphenous vein did not appear to be involved significantly. She was also noted to have deep venous reflux which is not treatable. Dr. Donzetta Matters mentioned the possibility of a central obstructive component leading to reflux and he offered her central venography. She wanted to discuss this or think about it. I have urged her to go ahead with this. She has had recurrent difficult wounds in these areas which do heal but after months in the clinic. If there is anything that can be done to reduce the likelihood of this I think it is worth it. 9/2 she is still working towards getting follow-up with Dr. Donzetta Matters to schedule her CT. Things are quite a bit worse venography. I put Apligraf on 2 weeks ago on both wounds on the medial and lateral part of her left lower leg. She arrives in clinic today with 3 superficial additional wounds above the area laterally and one below the wound medially. She describes a lot of discomfort. I think these are probably wrapped injuries. Does not look like she has cellulitis. 07/20/2019 on evaluation today patient appears to be doing somewhat poorly in regard to her lower extremity ulcers. She in fact showed signs of erythema in fact we may even be dealing with an infection at this time. Unfortunately I am unsure if this is just infection or if indeed there may be some  allergic reaction that occurred as a result of the Apligraf application. With that being said that would be unusual but nonetheless not impossible in this patient is one who is unfortunately allergic to quite a bit. Currently we have been using the Sorbact which seems to do as well as anything for her. I do think we may want to obtain [name], [name] a culture today to see if there is anything showing up there that may need to be addressed. 9/16; noted that last week the wounds look worse in 1 week follow-up of the Apligraf. Using Sorbact as of 2 days ago. She arrives with copious amounts of drainage and new skin breakdown on the back of the left calf. The wounds arm more substantial bilaterally. There is a fair amount of swelling in the left calf no overt DVT there is edema present I think in the left greater than right thigh. She is supposed to go on 9/28 for CT venography. The wounds on the medial and lateral calf are worse and she has new skin breakdown posteriorly at least new for me. This is almost developing into a circumferential wound area The Apligraf was taken off last week which I agree with things are not going in the right direction a culture was done we do not have that back yet. She is on Augmentin that she started 2 days  ago 9/23; dressing was changed by her nurses on Monday. In general there is no improvement in the wound areas although the area looks less angry than last week. She did get Augmentin for MSSA cultured on the 14th. She still appears to have too much swelling in the left leg even with 3 layer compression 9/30; the patient underwent her procedure on 9/28 by Dr. Donzetta Matters at vascular and vein specialist. She was discovered to have the common iliac vein measuring 12.2 mm but at the level of L4-L5 measured 3 mm. After stenting it measured 10 mm. 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 Rouleaux flow in the common femoral and femoral vein was  observed much improved after stenting. We are using silver alginate to the wounds on the medial and lateral ankle on the left. 4 layer compression Objective Constitutional Patient is hypertensive.. Pulse regular and within target range for patient.Marland Kitchen Respirations regular, non-labored and within target range.. Temperature is normal and within the target range for the patient.Marland Kitchen appears in no distress. Vitals Time Taken: 12:58 PM, Height: 63 in, Weight: 224.7 lbs, BMI: 39.8, Temperature: 98.8 F, Pulse: 94 bpm, Respiratory Rate: 16 breaths/min, Blood Pressure: 148/73 mmHg. Eyes Conjunctivae clear. No discharge. Respiratory Respiratory effort is easy and symmetric bilaterally. Rate is normal at rest and on room air.. Cardiovascular Pedal pulses palpable. Excellent edema control. Psychiatric No evidence of depression, anxiety, or agitation. Calm, cooperative, and communicative. Appropriate interactions and affect.. General Notes: Wound exam; the 2 original wounds are the left medial and left lateral ankle area. More extensive skin breakdown extends into the lower calf. The 2 major wounds were debrided to remove tightly adherent surface necrotic material also the new areas on the left lateral. She tolerates this marginally well Integumentary (Hair, Skin) No erythema around the wound. The degree of stasis dermatitis/venous inflammation in the area is a lot better.. Wound #5 status is Open. Original cause of wound was Gradually Appeared. The wound is located on the [place] ([id number]) Leg. The wound measures 3cm length x 3.1cm width x 0.1cm depth; 7.304cm^2 area and 0.73cm^3 volume. There is Fat Layer (Subcutaneous Tissue) Exposed exposed. There is no tunneling or undermining noted. There is a medium amount of serous drainage noted. The wound margin is indistinct and nonvisible. There is small (1-33%) pink granulation within the wound bed. There is a large (67-100%)  amount of necrotic tissue within the wound bed including Adherent Slough. Wound #6 status is Open. Original cause of wound was Gradually Appeared. The wound is located on the Left,Lateral Lower Leg. The wound measures 4cm length x 4cm width x 0.1cm depth; 12.566cm^2 area and 1.257cm^3 volume. There is Fat Layer (Subcutaneous Tissue) Exposed exposed. There is no tunneling or undermining noted. There is a large amount of serous drainage noted. The wound margin is indistinct and nonvisible. There is small (1-33%) pink granulation within the wound bed. There is a large (67-100%) amount of necrotic tissue within the wound bed including Adherent Slough. Wound #9 status is Open. Original cause of wound was Gradually Appeared. The wound is located on the Left,Lateral,Posterior Lower Leg. The wound measures 4.5cm length x 4cm width x 0.1cm depth; 14.137cm^2 area and 1.414cm^3 volume. There is Fat Layer (Subcutaneous Tissue) Exposed exposed. There is no tunneling or undermining noted. There is a large amount of serous drainage noted. The wound margin is indistinct and nonvisible. There is medium (34-66%) pink granulation within the wound bed. There is a medium (  34-66%) amount of necrotic tissue within the wound bed including Adherent Slough. Assessment Active Problems ICD-10 Non-pressure chronic ulcer of left calf limited to breakdown of skin Chronic venous hypertension (idiopathic) with inflammation of right lower extremity Lymphedema, not elsewhere classified Procedures Wound #5 Pre-procedure diagnosis of Wound #5 is a Lymphedema located on the Left,Medial Lower Leg . There was a Excisional Skin/Subcutaneous Tissue Debridement with a total area of 9.3 sq cm performed by [name] Dillon, MD. With the following instrument(s): Curette to remove Viable and Non-Viable tissue/material. Material removed includes Subcutaneous Tissue and Slough and after achieving pain control using Lidocaine. No specimens  were taken. A time out was conducted at 13:20, prior to the start of the procedure. A Minimum amount of bleeding was controlled with Pressure. The procedure was tolerated well. Post Debridement Measurements: 3cm length x 3.1cm width x 0.1cm depth; 0.73cm^3 volume. Character of Wound/Ulcer Post Debridement requires further debridement. Post procedure Diagnosis Wound #5: Same as Pre-Procedure Wound #6 Pre-procedure diagnosis of Wound #6 is a Venous Leg Ulcer located on the Left,Lateral Lower Leg .Severity of Tissue Pre Debridement is: Fat layer exposed. There was a Excisional Skin/Subcutaneous Tissue Debridement with a total area of 16 sq cm performed by [name] Dillon, MD. With the following instrument(s): Curette to remove Viable and Non-Viable tissue/material. Material removed includes Subcutaneous Tissue and Slough and after achieving pain control using Lidocaine. No specimens were taken. A time out was conducted at 13:20, prior to the start of the procedure. A Minimum amount of bleeding was controlled with Pressure. The procedure was tolerated well. Post Debridement Measurements: 4cm length x 4cm width x 0.1cm depth; 1.257cm^3 volume. Character of Wound/Ulcer Post Debridement requires further debridement. Severity of Tissue Post Debridement is: Fat layer exposed. DEVANA, DANYLUK ([id number]) Post procedure Diagnosis Wound #6: Same as Pre-Procedure Wound #9 Pre-procedure diagnosis of Wound #9 is a Venous Leg Ulcer located on the Left,Lateral,Posterior Lower Leg .Severity of Tissue Pre Debridement is: Fat layer exposed. There was a Excisional Skin/Subcutaneous Tissue Debridement with a total area of 18 sq cm performed by [name] Dillon, MD. With the following instrument(s): Curette to remove Viable and Non-Viable tissue/material. Material removed includes Subcutaneous Tissue and Slough and after achieving pain control using Lidocaine. No specimens were taken. A time out was conducted at  13:20, prior to the start of the procedure. A Minimum amount of bleeding was controlled with Pressure. The procedure was tolerated well. Post Debridement Measurements: 4cm length x 4.5cm width x 0.1cm depth; 1.414cm^3 volume. Character of Wound/Ulcer Post Debridement requires further debridement. Severity of Tissue Post Debridement is: Fat layer exposed. Post procedure Diagnosis Wound #9: Same as Pre-Procedure Plan Wound Cleansing: Wound #5 Left,Medial Lower Leg: Clean wound with Normal Saline. May Shower, gently pat wound dry prior to applying new dressing. Wound #6 Left,Lateral Lower Leg: Clean wound with Normal Saline. May Shower, gently pat wound dry prior to applying new dressing. Wound #9 Left,Lateral,Posterior Lower Leg: Clean wound with Normal Saline. May Shower, gently pat wound dry prior to applying new dressing. Anesthetic (add to Medication List): Wound #5 Left,Medial Lower Leg: Topical Lidocaine 4% cream applied to wound bed prior to debridement (In Clinic Only). Benzocaine Topical Anesthetic Spray applied to wound bed prior to debridement (In Clinic Only). Wound #6 Left,Lateral Lower Leg: Topical Lidocaine 4% cream applied to wound bed prior to debridement (In Clinic Only). Benzocaine Topical Anesthetic Spray applied to wound bed prior to debridement (In Clinic Only). Wound #9 Left,Lateral,Posterior Lower Leg:  Topical Lidocaine 4% cream applied to wound bed prior to debridement (In Clinic Only). Benzocaine Topical Anesthetic Spray applied to wound bed prior to debridement (In Clinic Only). Skin Barriers/Peri-Wound Care: Wound #5 Left,Medial Lower Leg: Barrier cream - Zinc oxide to periwound area Wound #6 Left,Lateral Lower Leg: Barrier cream - Zinc oxide to periwound area Wound #9 Left,Lateral,Posterior Lower Leg: Barrier cream - Zinc oxide to periwound area Primary Wound Dressing: Wound #5 Left,Medial Lower Leg: Silver Alginate Wound #6 Left,Lateral Lower  Leg: Silver Alginate Wound #9 Left,Lateral,Posterior Lower Leg: Silver Alginate Secondary Dressing: Wound #5 Left,Medial Lower Leg: ABD pad MINNIE, PRAY ([id number]) Drawtex Wound #6 Left,Lateral Lower Leg: ABD pad Drawtex Wound #9 Left,Lateral,Posterior Lower Leg: ABD pad Drawtex Dressing Change Frequency: Wound #5 Left,Medial Lower Leg: Change dressing every week Wound #6 Left,Lateral Lower Leg: Change dressing every week Wound #9 Left,Lateral,Posterior Lower Leg: Change dressing every week Follow-up Appointments: Wound #5 Left,Medial Lower Leg: Return Appointment in 1 week. Nurse Visit as needed Wound #6 Left,Lateral Lower Leg: Return Appointment in 1 week. Nurse Visit as needed Wound #9 Left,Lateral,Posterior Lower Leg: Return Appointment in 1 week. Nurse Visit as needed Edema Control: Wound #5 Left,Medial Lower Leg: 4-Layer Compression System - Left Lower Extremity. Wound #6 Left,Lateral Lower Leg: 4-Layer Compression System - Left Lower Extremity. Wound #9 Left,Lateral,Posterior Lower Leg: 4-Layer Compression System - Left Lower Extremity. Off-Loading: Wound #5 Left,Medial Lower Leg: Other: - Elevate legs as needed Wound #6 Left,Lateral Lower Leg: Other: - Elevate legs as needed Wound #9 Left,Lateral,Posterior Lower Leg: Other: - Elevate legs as needed 1. Hopefully post stenting the left common iliac vein there will be less venous hypertension in the area about the ankle. 2. I continued with silver alginate under 4-layer compression. 3. The patient feels she needs to have this change she can call the office on either Friday or Monday 4. She is advised to call the vein and vascular people with regards to checking of her thigh wound. Electronic Signature(s) Signed: 08/05/2019 4:51:07 PM By: Linton Ham MD Entered By: Linton Ham on 08/05/2019 13:47:39 Radziewicz, [name]  ([id number]) -------------------------------------------------------------------------------- SuperBill Details Patient Name: LOGHAN, MARICONDA. Date of Service: 08/05/2019 Medical Record Number: [id number] Patient Account Number: [id number] Date of Birth/Sex: 07/19/1946 (73 y.o. 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 Modifier: of skin Quantity: 1 CPT4 Code: [id number] Description: [id number] - DEB SUBQ TISS EA ADDL 20CM ICD-10 Diagnosis Description 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

## 2019-08-05 NOTE — Progress Notes (Signed)
IMALAY, BEMBENEK ([id number]) Visit Report for 08/05/2019 Arrival Information Details Patient Name: CELLIA, REIK. Date of Service: 08/05/2019 12:45 PM Medical Record Number: [id number] Patient Account Number: [id number] Date of Birth/Sex: 1946-08-18 (73 y.o. F) Treating RN: Army Melia Primary Care Andrena Margerum: [name] Other Clinician: Referring Candid Bovey: [name] Treating Devansh Riese/Extender: Tito Dine in Treatment: 37 Visit Information History Since Last Visit Added or deleted any medications: No Patient Arrived: Cane Any new allergies or adverse reactions: No Arrival Time: 12:57 Had a fall or experienced change in No Accompanied By: self activities of daily living that may affect Transfer Assistance: None risk of falls: Patient Identification Verified: Yes Signs or symptoms of abuse/neglect since last visito No Patient Requires Transmission-Based No Hospitalized since last visit: No Precautions: Has Dressing in Place as Prescribed: Yes Patient Has Alerts: Yes Pain Present Now: No Patient Alerts: Patient on Blood Thinner aspirin 81 Electronic Signature(s) Signed: 08/05/2019 4:21:29 PM By: Army Melia Entered By: Army Melia on 08/05/2019 12:58:11 Smyers, [name] ([id number]) -------------------------------------------------------------------------------- Encounter Discharge Information Details Patient Name: KAEDANCE, ERPS. Date of Service: 08/05/2019 12:45 PM Medical Record Number: [id number] Patient Account Number: [id number] Date of Birth/Sex: 08-Feb-1946 (73 y.o. F) Treating RN: Cornell Barman Primary Care Sya Nestler: [name] Other Clinician: Referring Khi Mcmillen: [name] Treating Wynelle Dreier/Extender: Tito Dine in Treatment: 34 Encounter Discharge Information Items Post Procedure Vitals Discharge Condition: Stable Temperature (F): 98.8 Ambulatory Status: Ambulatory Pulse (bpm): 94 Discharge Destination:  Home Respiratory Rate (breaths/min): 16 Transportation: Private Auto Blood Pressure (mmHg): 148/73 Accompanied By: self Schedule Follow-up Appointment: Yes Clinical Summary of Care: Electronic Signature(s) Signed: 08/05/2019 5:01:16 PM By: [name], [name], RN, CWS, [name], [name] Entered By: [name], [name], RN, CWS, Kim on 08/05/2019 13:32:31 [name] ([id number]) -------------------------------------------------------------------------------- Lower Extremity Assessment Details Patient Name: EMONE, ONDER. Date of Service: 08/05/2019 12:45 PM Medical Record Number: [id number] Patient Account Number: [id number] Date of Birth/Sex: October 24, 1946 (73 y.o. F) Treating RN: Army Melia Primary Care Sahalie Beth: [name] Other Clinician: Referring Chekesha Behlke: [name] Treating Dorota Heinrichs/Extender: Tito Dine in Treatment: 34 Edema Assessment Assessed: [Left: No] [Right: No] Edema: [Left: Ye] [Right: s] Calf Left: Right: Point of Measurement: 31 cm From Medial Instep 39.4 cm cm Ankle Left: Right: Point of Measurement: 12 cm From Medial Instep 23 cm cm Vascular Assessment Pulses: Dorsalis Pedis Palpable: [Left:Yes] Electronic Signature(s) Signed: 08/05/2019 4:21:29 PM By: Army Melia Entered By: Army Melia on 08/05/2019 13:16:56 Dolle, Soraiya Lenna Sciara ([id number]) -------------------------------------------------------------------------------- Multi Wound Chart Details Patient Name: [name], [name]. Date of Service: 08/05/2019 12:45 PM Medical Record Number: [id number] Patient Account Number: [id number] Date of Birth/Sex: 01/05/1946 (73 y.o. F) Treating RN: Cornell Barman Primary Care Skilynn Durney: [name] Other Clinician: Referring Lillyanne Bradburn: [name] Treating Aizlynn Digilio/Extender: Tito Dine in Treatment: 34 Vital Signs Height(in): 63 Pulse(bpm): 16 Weight(lbs): 224.7 Blood Pressure(mmHg): 148/73 Body Mass Index(BMI): 40 Temperature(F):  98.8 Respiratory Rate 16 (breaths/min): Photos: Wound Location: Left Lower Leg - Medial Left, Lateral Lower Leg Left Lower Leg - Lateral, Posterior Wounding Event: Gradually Appeared Gradually Appeared Gradually Appeared Primary Etiology: Lymphedema Venous Leg Ulcer Venous Leg Ulcer Comorbid History: Cataracts, Asthma, Sleep Cataracts, Asthma, Sleep Cataracts, Asthma, Sleep Apnea, Deep Vein Apnea, Deep Vein Apnea, Deep Vein Thrombosis, Hypertension, Thrombosis, Hypertension, Thrombosis, Hypertension, Peripheral Venous Disease, Peripheral Venous Disease, Peripheral Venous Disease, Osteoarthritis, Received Osteoarthritis, Received Osteoarthritis, Received Chemotherapy, Received Chemotherapy, Received Chemotherapy, Received Radiation Radiation Radiation Date Acquired: 11/19/2018 01/19/2019 07/08/2019 Weeks of Treatment: 34 28 4 Wound  Status: Open Open Open Measurements L x W x D 3x3.1x0.1 4x4x0.1 4.5x4x0.1 (cm) Area (cm) : 7.304 12.566 14.137 Volume (cm) : 0.73 1.257 1.414 % Reduction in Area: 14.50% -5611.80% -2402.10% % Reduction in Volume: 14.60% -5613.60% -2380.70% Classification: Full Thickness Without Full Thickness Without Full Thickness Without Exposed Support Structures Exposed Support Structures Exposed Support Structures Exudate Amount: Medium Large Large Exudate Type: Serous Serous Serous Exudate Color: Geophysical data processor Wound Margin: Indistinct, nonvisible Indistinct, nonvisible Indistinct, nonvisible Granulation Amount: Small (1-33%) Small (1-33%) Medium (34-66%) Granulation Quality: Pink Pink Pink Necrotic Amount: Large (67-100%) Large (67-100%) Medium (34-66%) Bang, Malynda J. ([id number]) Exposed Structures: Fat Layer (Subcutaneous Fat Layer (Subcutaneous Fat Layer (Subcutaneous Tissue) Exposed: Yes Tissue) Exposed: Yes Tissue) Exposed: Yes Fascia: No Fascia: No Fascia: No Tendon: No Tendon: No Tendon: No Muscle: No Muscle: No Muscle: No Joint: No Joint:  No Joint: No Bone: No Bone: No Bone: No Epithelialization: None None Medium (34-66%) Debridement: Debridement - Excisional Debridement - Excisional Debridement - Excisional Pre-procedure 13:20 13:20 13:20 Verification/Time Out Taken: Pain Control: Lidocaine Lidocaine Lidocaine Tissue Debrided: Subcutaneous, Slough Subcutaneous, Slough Subcutaneous, Slough Level: Skin/Subcutaneous Tissue Skin/Subcutaneous Tissue Skin/Subcutaneous Tissue Debridement Area (sq cm): 9.3 16 18  Instrument: Curette Curette Curette Bleeding: Minimum Minimum Minimum Hemostasis Achieved: Pressure Pressure Pressure Debridement Treatment Procedure was tolerated well Procedure was tolerated well Procedure was tolerated well Response: Post Debridement 3x3.1x0.1 4x4x0.1 4x4.5x0.1 Measurements L x W x D (cm) Post Debridement Volume: 0.73 1.257 1.414 (cm) Procedures Performed: Debridement Debridement Debridement Treatment Notes Wound #5 (Left, Medial Lower Leg) Notes Zinc, silver cell, drawtex, ABD 4 layer Wound #6 (Left, Lateral Lower Leg) Notes Zinc, silver cell, drawtex, ABD 4 layer Wound #9 (Left, Lateral, Posterior Lower Leg) Notes Zinc, silver cell, drawtex, ABD 4 layer Electronic Signature(s) Signed: 08/05/2019 4:51:07 PM By: Linton Ham MD Entered By: Linton Ham on 08/05/2019 13:40:37 Shrewsberry, [name] -------------------------------------------------------------------------------- Multi-Disciplinary Care Plan Details Patient Name: BROOKELLE, CULLUM. Date of Service: 08/05/2019 12:45 PM Medical Record Number: [id number] Patient Account Number: [id number] Date of Birth/Sex: 10-16-1946 (73 y.o. F) Treating RN: Cornell Barman Primary Care Ellean Firman: [name] Other Clinician: Referring Willis Kuipers: [name] Treating Koralyn Prestage/Extender: Tito Dine in Treatment: 65 Active Inactive Orientation to the Wound Care Program Nursing Diagnoses: Knowledge deficit related  to the wound healing center program Goals: Patient/caregiver will verbalize understanding of the [employer] Program Date Initiated: 12/10/2018 Target Resolution Date: 01/09/2019 Goal Status: Active Interventions: Provide education on orientation to the wound center Notes: Soft Tissue Infection Nursing Diagnoses: Impaired tissue integrity Goals: Patient's soft tissue infection will resolve Date Initiated: 12/10/2018 Target Resolution Date: 01/09/2019 Goal Status: Active Interventions: Assess signs and symptoms of infection every visit Notes: Venous Leg Ulcer Nursing Diagnoses: Actual venous Insuffiency (use after diagnosis is confirmed) Goals: Patient will maintain optimal edema control Date Initiated: 12/10/2018 Target Resolution Date: 01/09/2019 Goal Status: Active Interventions: Assess peripheral edema status every visit. CAMREE, CABA ([id number]) Treatment Activities: Therapeutic compression applied : 12/10/2018 Notes: Wound/Skin Impairment Nursing Diagnoses: Impaired tissue integrity Goals: Patient/caregiver will verbalize understanding of skin care regimen Date Initiated: 12/10/2018 Target Resolution Date: 01/09/2019 Goal Status: Active Interventions: Assess ulceration(s) every visit Treatment Activities: Topical wound management initiated : 12/10/2018 Notes: Electronic Signature(s) Signed: 08/05/2019 5:01:16 PM By: [name], [name], RN, CWS, [name], [name] Entered By: [name], [name], RN, CWS, Kim on 08/05/2019 13:26:40 [place], [name] -------------------------------------------------------------------------------- Pain Assessment Details Patient Name: SHARLOT, FREEDLAND. Date of Service: 08/05/2019 12:45 PM Medical Record  Number: [id number] Patient Account Number: [id number] Date of Birth/Sex: [name] 06, 1946 (73 y.o. F) Treating RN: Army Melia Primary Care Jersi Mcmaster: [name] Other Clinician: Referring Kyce Ging: [name] Treating Amando Ishikawa/Extender:  Tito Dine in Treatment: 34 Active Problems Location of Pain Severity and Description of Pain Patient Has Paino No Site Locations Pain Management and Medication Current Pain Management: Electronic Signature(s) Signed: 08/05/2019 4:21:29 PM By: Army Melia Entered By: Army Melia on 08/05/2019 12:58:16 Lorey, [name] -------------------------------------------------------------------------------- Patient/Caregiver Education Details Patient Name: TIMMY, DELAHOUSSAYE. Date of Service: 08/05/2019 12:45 PM Medical Record Number: [id number] Patient Account Number: [id number] Date of Birth/Gender: 02-15-1946 (73 y.o. F) Treating RN: Cornell Barman Primary Care Physician: [name] Other Clinician: Referring Physician: [name] Treating Physician/Extender: Tito Dine in Treatment: 69 Education Assessment Education Provided To: Patient Education Topics Provided Wound/Skin Impairment: Handouts: Caring for Your Ulcer Methods: Demonstration, Explain/Verbal Responses: State content correctly Electronic Signature(s) Signed: 08/05/2019 5:01:16 PM By: [name], [name], RN, CWS, [name], [name] Entered By: [name], [name], RN, CWS, Kim on 08/05/2019 13:30:58 [name] -------------------------------------------------------------------------------- Wound Assessment Details Patient Name: CALYX, SERAFINI. Date of Service: 08/05/2019 12:45 PM Medical Record Number: [id number] Patient Account Number: [id number] Date of Birth/Sex: 1946-06-24 (73 y.o. F) Treating RN: Army Melia Primary Care Aryan Bello: [name] Other Clinician: Referring Charlotte Brafford: [name] Treating Saraiyah Hemminger/Extender: Tito Dine in Treatment: 34 Wound Status Wound Number: 5 Primary Lymphedema Etiology: Wound Location: Left Lower Leg - Medial Wound Open Wounding Event: Gradually Appeared Status: Date Acquired: 11/19/2018 Comorbid Cataracts, Asthma,  Sleep Apnea, Deep Vein Weeks Of Treatment: 34 History: Thrombosis, Hypertension, Peripheral Venous Clustered Wound: No Disease, Osteoarthritis, Received Chemotherapy, Received Radiation Photos Wound Measurements Length: (cm) 3 Width: (cm) 3.1 Depth: (cm) 0.1 Area: (cm) 7.304 Volume: (cm) 0.73 % Reduction in Area: 14.5% % Reduction in Volume: 14.6% Epithelialization: None Tunneling: No Undermining: No Wound Description Full Thickness Without Exposed Support Classification: Structures Wound Margin: Indistinct, nonvisible Exudate Medium Amount: Exudate Type: Serous Exudate Color: amber Foul Odor After Cleansing: No Slough/Fibrino Yes Wound Bed Granulation Amount: Small (1-33%) Exposed Structure Granulation Quality: Pink Fascia Exposed: No Necrotic Amount: Large (67-100%) Fat Layer (Subcutaneous Tissue) Exposed: Yes Necrotic Quality: Adherent Slough Tendon Exposed: No Muscle Exposed: No Joint Exposed: No Bone Exposed: No Brien, Anna-Marie J. ([id number]) Treatment Notes Wound #5 (Left, Medial Lower Leg) Notes Zinc, silver cell, drawtex, ABD 4 layer Electronic Signature(s) Signed: 08/05/2019 4:21:29 PM By: Army Melia Entered By: Army Melia on 08/05/2019 13:13:58 Rodd, [name] -------------------------------------------------------------------------------- Wound Assessment Details Patient Name: MARCHETTA, SOLES. Date of Service: 08/05/2019 12:45 PM Medical Record Number: [id number] Patient Account Number: [id number] Date of Birth/Sex: 04/25/1946 (73 y.o. F) Treating RN: Cornell Barman Primary Care Arnice Vanepps: [name] Other Clinician: Referring Ayanna Gheen: [name] Treating Dannica Bickham/Extender: Tito Dine in Treatment: 34 Wound Status Wound Number: 6 Primary Venous Leg Ulcer Etiology: Wound Location: Left, Lateral Lower Leg Wound Open Wounding Event: Gradually Appeared Status: Date Acquired: 01/19/2019 Comorbid Cataracts,  Asthma, Sleep Apnea, Deep Vein Weeks Of Treatment: 28 History: Thrombosis, Hypertension, Peripheral Venous Clustered Wound: No Disease, Osteoarthritis, Received Chemotherapy, Received Radiation Photos Wound Measurements Length: (cm) 4 Width: (cm) 4 Depth: (cm) 0.1 Area: (cm) 12.566 Volume: (cm) 1.257 % Reduction in Area: -5611.8% % Reduction in Volume: -5613.6% Epithelialization: None Tunneling: No Undermining: No Wound Description Full Thickness Without Exposed Support Foul O Classification: Structures Slough Wound Margin: Indistinct, nonvisible Exudate Large Amount: Exudate Type: Serous Exudate Color: amber dor  After Cleansing: No /Fibrino Yes Wound Bed Granulation Amount: Small (1-33%) Exposed Structure Granulation Quality: Pink Fascia Exposed: No Necrotic Amount: Large (67-100%) Fat Layer (Subcutaneous Tissue) Exposed: Yes Necrotic Quality: Adherent Slough Tendon Exposed: No Muscle Exposed: No Joint Exposed: No Bone Exposed: No ALYIAH, OIEN ([id number]) Treatment Notes Wound #6 (Left, Lateral Lower Leg) Notes Zinc, silver cell, drawtex, ABD 4 layer Electronic Signature(s) Signed: 08/05/2019 5:01:16 PM By: [name], [name], RN, CWS, [name], [name] Entered By: [name], [name], RN, CWS, Kim on 08/05/2019 13:26:28 [name] ([id number]) -------------------------------------------------------------------------------- Wound Assessment Details Patient Name: TOBITHA, OVERTON. Date of Service: 08/05/2019 12:45 PM Medical Record Number: [id number] Patient Account Number: [id number] Date of Birth/Sex: Mar 25, 1946 (73 y.o. F) Treating RN: Army Melia Primary Care Chekesha Behlke: [name] Other Clinician: Referring Jagar Lua: [name] Treating Elvin Banker/Extender: Tito Dine in Treatment: 34 Wound Status Wound Number: 9 Primary Venous Leg Ulcer Etiology: Wound Location: Left Lower Leg - Lateral, Posterior Wound Open Wounding Event: Gradually  Appeared Status: Date Acquired: 07/08/2019 Comorbid Cataracts, Asthma, Sleep Apnea, Deep Vein Weeks Of Treatment: 4 History: Thrombosis, Hypertension, Peripheral Venous Clustered Wound: No Disease, Osteoarthritis, Received Chemotherapy, Received Radiation Photos Wound Measurements Length: (cm) 4.5 Width: (cm) 4 Depth: (cm) 0.1 Area: (cm) 14.137 Volume: (cm) 1.414 % Reduction in Area: -2402.1% % Reduction in Volume: -2380.7% Epithelialization: Medium (34-66%) Tunneling: No Undermining: No Wound Description Full Thickness Without Exposed Support Classification: Structures Wound Margin: Indistinct, nonvisible Exudate Large Amount: Exudate Type: Serous Exudate Color: amber Foul Odor After Cleansing: No Slough/Fibrino Yes Wound Bed Granulation Amount: Medium (34-66%) Exposed Structure Granulation Quality: Pink Fascia Exposed: No Necrotic Amount: Medium (34-66%) Fat Layer (Subcutaneous Tissue) Exposed: Yes Necrotic Quality: Adherent Slough Tendon Exposed: No Muscle Exposed: No Joint Exposed: No Bone Exposed: No Lollar, Epifania J. ([id number]) Treatment Notes Wound #9 (Left, Lateral, Posterior Lower Leg) Notes Zinc, silver cell, drawtex, ABD 4 layer Electronic Signature(s) Signed: 08/05/2019 4:21:29 PM By: Army Melia Entered By: Army Melia on 08/05/2019 13:15:09 Mcgillicuddy, [name] ([id number]) -------------------------------------------------------------------------------- Vitals Details Patient Name: ROYLENE, MAHI. Date of Service: 08/05/2019 12:45 PM Medical Record Number: [id number] Patient Account Number: [id number] Date of Birth/Sex: 01-28-1946 (73 y.o. F) Treating RN: Army Melia Primary Care Lexxus Underhill: [name] Other Clinician: Referring Jehad Bisono: [name] Treating Amilyah Nack/Extender: Tito Dine in Treatment: 34 Vital Signs Time Taken: 12:58 Temperature (F): 98.8 Height (in): 63 Pulse (bpm): 94 Weight (lbs):  224.7 Respiratory Rate (breaths/min): 16 Body Mass Index (BMI): 39.8 Blood Pressure (mmHg): 148/73 Reference Range: 80 - 120 mg / dl Electronic Signature(s) Signed: 08/05/2019 4:21:29 PM By: Army Melia Entered By: Army Melia on 08/05/2019 13:04:31

## 2019-08-07 ENCOUNTER — Encounter: Payer: Medicare Other | Attending: Physician Assistant

## 2019-08-07 ENCOUNTER — Other Ambulatory Visit: Payer: Self-pay

## 2019-08-07 DIAGNOSIS — I890 Lymphedema, not elsewhere classified: Secondary | ICD-10-CM | POA: Diagnosis not present

## 2019-08-07 DIAGNOSIS — L97221 Non-pressure chronic ulcer of left calf limited to breakdown of skin: Secondary | ICD-10-CM | POA: Insufficient documentation

## 2019-08-07 NOTE — Progress Notes (Signed)
[name] Visit Report for 08/07/2019 Arrival Information Details Patient Name: [name], [name]. Date of Service: 08/07/2019 12:30 PM Medical Record Number: [id number] Patient Account Number: [id number] Date of Birth/Sex: 04-21-1946 (73 y.o. F) Treating RN: Montey Hora Primary Care Cydni Reddoch: [name] Other Clinician: Referring Javana Schey: [name] Treating Tavi Hoogendoorn/Extender: Melburn Hake, HOYT Weeks in Treatment: 61 Visit Information History Since Last Visit Added or deleted any medications: No Patient Arrived: Ambulatory Any new allergies or adverse reactions: No Arrival Time: 12:40 Had a fall or experienced change in No Accompanied By: self activities of daily living that may affect Transfer Assistance: None risk of falls: Patient Identification Verified: Yes Signs or symptoms of abuse/neglect since last visito No Secondary Verification Process Yes Hospitalized since last visit: No Completed: Implantable device outside of the clinic excluding No Patient Requires Transmission-Based No cellular tissue based products placed in the center Precautions: since last visit: Patient Has Alerts: Yes Has Dressing in Place as Prescribed: Yes Patient Alerts: Patient on Blood Has Compression in Place as Prescribed: Yes Thinner Pain Present Now: No aspirin 81 Electronic Signature(s) Signed: 08/07/2019 1:24:16 PM By: Army Melia Entered By: Army Melia on 08/07/2019 12:51:49 [name] -------------------------------------------------------------------------------- Compression Therapy Details Patient Name: [name], [name]. Date of Service: 08/07/2019 12:30 PM Medical Record Number: [id number] Patient Account Number: [id number] Date of Birth/Sex: 17-Jul-1946 (73 y.o. F) Treating RN: Army Melia Primary Care Tayen Narang: [name] Other Clinician: Referring Zerina Hallinan: [name] Treating Adden Strout/Extender: STONE III,  HOYT Weeks in Treatment: 34 Compression Therapy Performed for Wound Assessment: Wound #5 Left,Medial Lower Leg Performed By: Clinician Army Melia, RN Compression Type: Three Layer Pre Treatment ABI: 1 Electronic Signature(s) Signed: 08/07/2019 1:24:16 PM By: Army Melia Entered By: Army Melia on 08/07/2019 12:55:09 [name] -------------------------------------------------------------------------------- Compression Therapy Details Patient Name: [name], [name]. Date of Service: 08/07/2019 12:30 PM Medical Record Number: [id number] Patient Account Number: [id number] Date of Birth/Sex: 11/07/1945 (73 y.o. F) Treating RN: Army Melia Primary Care Mckayla Mulcahey: [name] Other Clinician: Referring Shelton Soler: [name] Treating Jeslie Lowe/Extender: STONE III, HOYT Weeks in Treatment: 34 Compression Therapy Performed for Wound Assessment: Wound #6 Left,Lateral Lower Leg Performed By: Clinician Army Melia, RN Compression Type: Three Layer Pre Treatment ABI: 1 Electronic Signature(s) Signed: 08/07/2019 1:24:16 PM By: Army Melia Entered By: Army Melia on 08/07/2019 12:55:09 [name] -------------------------------------------------------------------------------- Compression Therapy Details Patient Name: [name], [name]. Date of Service: 08/07/2019 12:30 PM Medical Record Number: [id number] Patient Account Number: [id number] Date of Birth/Sex: 1945/12/18 (73 y.o. F) Treating RN: Army Melia Primary Care Alpheus Stiff: [name] Other Clinician: Referring Lyell Clugston: [name] Treating Gearline Spilman/Extender: STONE III, HOYT Weeks in Treatment: 34 Compression Therapy Performed for Wound Assessment: Wound #9 Left,Lateral,Posterior Lower Leg Performed By: Clinician Army Melia, RN Compression Type: Three Layer Pre Treatment ABI: 1 Electronic Signature(s) Signed: 08/07/2019 1:24:16 PM By: Army Melia Entered By: Army Melia on  08/07/2019 12:55:09 [name] -------------------------------------------------------------------------------- Encounter Discharge Information Details Patient Name: [name], [name]. Date of Service: 08/07/2019 12:30 PM Medical Record Number: [id number] Patient Account Number: [id number] Date of Birth/Sex: Mar 02, 1946 (73 y.o. F) Treating RN: Army Melia Primary Care Praneel Haisley: [name] Other Clinician: Referring Kersten Salmons: [name] Treating Azra Abrell/Extender: Melburn Hake, HOYT Weeks in Treatment: 84 Encounter Discharge Information Items Discharge Condition: Stable Ambulatory Status: Cane Discharge Destination: Home Transportation: Private Auto Accompanied By: self Schedule Follow-up Appointment: Yes Clinical Summary of Care: Electronic Signature(s) Signed: 08/07/2019 1:24:16 PM By: Army Melia Entered By: Nicki Reaper,  Dajea on 08/07/2019 12:56:35 [name] -------------------------------------------------------------------------------- Wound Assessment Details Patient Name: [name], [name]. Date of Service: 08/07/2019 12:30 PM Medical Record Number: [id number] Patient Account Number: [id number] Date of Birth/Sex: [birth year] (72 y.o. F) Treating RN: Army Melia Primary Care Jenell Dobransky: [name] Other Clinician: Referring Jamielee Mchale: [name] Treating Aftyn Nott/Extender: STONE III, HOYT Weeks in Treatment: 34 Wound Status Wound Number: 5 Primary Lymphedema Etiology: Wound Location: Left Lower Leg - Medial Wound Open Wounding Event: Gradually Appeared Status: Date Acquired: 11/19/2018 Comorbid Cataracts, Asthma, Sleep Apnea, Deep Vein Weeks Of Treatment: 34 History: Thrombosis, Hypertension, Peripheral Venous Clustered Wound: No Disease, Osteoarthritis, Received Chemotherapy, Received Radiation Wound Measurements Length: (cm) 3 Width: (cm) 3.1 Depth: (cm) 0.1 Area: (cm) 7.304 Volume: (cm) 0.73 % Reduction in  Area: 14.5% % Reduction in Volume: 14.6% Epithelialization: None Tunneling: No Undermining: No Wound Description Full Thickness Without Exposed Support Classification: Structures Wound Margin: Indistinct, nonvisible Exudate Medium Amount: Exudate Type: Serous Exudate Color: amber Foul Odor After Cleansing: No Slough/Fibrino Yes Wound Bed Granulation Amount: Small (1-33%) Exposed Structure Granulation Quality: Pink Fascia Exposed: No Necrotic Amount: Large (67-100%) Fat Layer (Subcutaneous Tissue) Exposed: Yes Necrotic Quality: Adherent Slough Tendon Exposed: No Muscle Exposed: No Joint Exposed: No Bone Exposed: No Treatment Notes Wound #5 (Left, Medial Lower Leg) Notes Zinc, silver cell, drawtex, ABD 3 layer Electronic Signature(s) Signed: 08/07/2019 1:24:16 PM By: Army Melia Entered By: Army Melia on 08/07/2019 12:54:13 Buford, Tenna [name] ([id number]) Musa, Tenna [name] ([id number]) -------------------------------------------------------------------------------- Wound Assessment Details Patient Name: [name], [name]. Date of Service: 08/07/2019 12:30 PM Medical Record Number: [id number] Patient Account Number: [id number] Date of Birth/Sex: [birth year] (73 y.o. F) Treating RN: Army Melia Primary Care Particia Strahm: [name] Other Clinician: Referring Lilinoe Acklin: [name] Treating Greysen Swanton/Extender: STONE III, HOYT Weeks in Treatment: 34 Wound Status Wound Number: 6 Primary Venous Leg Ulcer Etiology: Wound Location: Left Lower Leg - Lateral Wound Open Wounding Event: Gradually Appeared Status: Date Acquired: 01/19/2019 Comorbid Cataracts, Asthma, Sleep Apnea, Deep Vein Weeks Of Treatment: 28 History: Thrombosis, Hypertension, Peripheral Venous Clustered Wound: No Disease, Osteoarthritis, Received Chemotherapy, Received Radiation Wound Measurements Length: (cm) 4 Width: (cm) 4 Depth: (cm) 0.1 Area: (cm) 12.566 Volume: (cm) 1.257 % Reduction in  Area: -5611.8% % Reduction in Volume: -5613.6% Epithelialization: None Tunneling: No Undermining: No Wound Description Full Thickness Without Exposed Support Classification: Structures Wound Margin: Indistinct, nonvisible Exudate Large Amount: Exudate Type: Serous Exudate Color: amber Foul Odor After Cleansing: No Slough/Fibrino Yes Wound Bed Granulation Amount: Small (1-33%) Exposed Structure Granulation Quality: Pink Fascia Exposed: No Necrotic Amount: Large (67-100%) Fat Layer (Subcutaneous Tissue) Exposed: Yes Necrotic Quality: Adherent Slough Tendon Exposed: No Muscle Exposed: No Joint Exposed: No Bone Exposed: No Treatment Notes Wound #6 (Left, Lateral Lower Leg) Notes Zinc, silver cell, drawtex, ABD 3 layer Electronic Signature(s) Signed: 08/07/2019 1:24:16 PM By: Army Melia Entered By: Army Melia on 08/07/2019 12:54:27 Nomura, Tenna [name] ([id number]) Yearick, Tenna [name] ([id number]) -------------------------------------------------------------------------------- Wound Assessment Details Patient Name: [name], [name]. Date of Service: 08/07/2019 12:30 PM Medical Record Number: [id number] Patient Account Number: [id number] Date of Birth/Sex: 07/16/1946 (73 y.o. F) Treating RN: Army Melia Primary Care Trenyce Loera: [name] Other Clinician: Referring Alleya Demeter: [name] Treating Amamda Curbow/Extender: STONE III, HOYT Weeks in Treatment: 34 Wound Status Wound Number: 9 Primary Venous Leg Ulcer Etiology: Wound Location: Left Lower Leg - Lateral, Posterior Wound Open Wounding Event: Gradually Appeared Status: Date Acquired: 07/08/2019 Comorbid Cataracts, Asthma, Sleep Apnea, Deep Vein Weeks Of Treatment: 4 History: Thrombosis, Hypertension,  Peripheral Venous Clustered Wound: No Disease, Osteoarthritis, Received Chemotherapy, Received Radiation Wound Measurements Length: (cm) 4.5 Width: (cm) 4 Depth: (cm) 0.1 Area: (cm) 14.137 Volume: (cm)  1.414 % Reduction in Area: -2402.1% % Reduction in Volume: -2380.7% Epithelialization: Medium (34-66%) Tunneling: No Undermining: No Wound Description Full Thickness Without Exposed Support Classification: Structures Wound Margin: Indistinct, nonvisible Exudate Large Amount: Exudate Type: Serous Exudate Color: amber Foul Odor After Cleansing: No Slough/Fibrino Yes Wound Bed Granulation Amount: Medium (34-66%) Exposed Structure Granulation Quality: Pink Fascia Exposed: No Necrotic Amount: Medium (34-66%) Fat Layer (Subcutaneous Tissue) Exposed: Yes Necrotic Quality: Adherent Slough Tendon Exposed: No Muscle Exposed: No Joint Exposed: No Bone Exposed: No Treatment Notes Wound #9 (Left, Lateral, Posterior Lower Leg) Notes Zinc, silver cell, drawtex, ABD 3 layer Electronic Signature(s) Signed: 08/07/2019 1:24:16 PM By: Army Melia Entered By: Army Melia on 08/07/2019 12:54:46

## 2019-08-12 ENCOUNTER — Encounter: Payer: Medicare Other | Admitting: Internal Medicine

## 2019-08-12 ENCOUNTER — Other Ambulatory Visit: Payer: Self-pay

## 2019-08-12 DIAGNOSIS — L97222 Non-pressure chronic ulcer of left calf with fat layer exposed: Secondary | ICD-10-CM | POA: Diagnosis not present

## 2019-08-12 DIAGNOSIS — L97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d: Secondary | ICD-10-CM | POA: Diagnosis not present

## 2019-08-12 DIAGNOSIS — I890 Lymphedema, not elsewhere classified: Secondary | ICD-10-CM | POA: Diagnosis not present

## 2019-08-12 DIAGNOSIS — I87312 Chronic venous hypertension (idiopathic) with ulcer of left lower extremity: Secondary | ICD-10-CM | POA: Diagnosis not present

## 2019-08-12 DIAGNOSIS — L97221 Non-pressure chronic ulcer of left calf limited to breakdown of skin: Secondary | ICD-10-CM | POA: Diagnosis not present

## 2019-08-12 NOTE — Progress Notes (Signed)
TRENITY, KURIAN ([id number]) Visit Report for 08/12/2019 Debridement Details Patient Name: NOELY, NORMANDIN. Date of Service: 08/12/2019 12:45 PM Medical Record Number: [id number] Patient Account Number: [id number] Date of Birth/Sex: August 18, 1946 (73 y.o. F) Treating RN: Cornell Barman Primary Care Provider: [name] Other Clinician: Referring Provider: [name] Treating Provider/Extender: Tito Dine in Treatment: 35 Debridement Performed for Wound #5 Left,Medial Lower Leg Assessment: Performed By: Physician [name] Dillon, MD Debridement Type: Debridement Level of Consciousness (Pre- Awake and Alert procedure): Pre-procedure Verification/Time Yes - 13:09 Out Taken: Start Time: 13:09 Pain Control: Lidocaine Total Area Debrided (L x W): 2.9 (cm) x 2.9 (cm) = 8.41 (cm) Tissue and other material Viable, Slough, Subcutaneous, Slough debrided: Level: Skin/Subcutaneous Tissue Debridement Description: Excisional Instrument: Curette Bleeding: Moderate Hemostasis Achieved: Pressure End Time: 13:13 Response to Treatment: Procedure was tolerated well Level of Consciousness Awake and Alert (Post-procedure): Post Debridement Measurements of Total Wound Length: (cm) 2.9 Width: (cm) 2.9 Depth: (cm) 0.2 Volume: (cm) 1.321 Character of Wound/Ulcer Post Debridement: Stable Post Procedure Diagnosis Same as Pre-procedure Electronic Signature(s) Signed: 08/12/2019 5:14:46 PM By: Linton Ham MD Signed: 08/12/2019 5:19:46 PM By: [name], [name], RN, CWS, [name], [name] Entered By: Linton Ham on 08/12/2019 13:24:40 [name], [name] -------------------------------------------------------------------------------- Debridement Details Patient Name: HAJA, FLORA. Date of Service: 08/12/2019 12:45 PM Medical Record Number: [id number] Patient Account Number: [id number] Date of Birth/Sex: [name] 08, 1946 (73 y.o. F) Treating RN: Cornell Barman Primary Care  Provider: [name] Other Clinician: Referring Provider: [name] Treating Provider/Extender: Tito Dine in Treatment: 35 Debridement Performed for Wound #9 Left,Lateral,Posterior Lower Leg Assessment: Performed By: Physician [name] Dillon, MD Debridement Type: Debridement Severity of Tissue Pre Fat layer exposed Debridement: Level of Consciousness (Pre- Awake and Alert procedure): Pre-procedure Verification/Time Yes - 13:09 Out Taken: Start Time: 13:09 Pain Control: Lidocaine Total Area Debrided (L x W): 4 (cm) x 3.5 (cm) = 14 (cm) Tissue and other material Viable, Slough, Subcutaneous, Slough debrided: Level: Skin/Subcutaneous Tissue Debridement Description: Excisional Instrument: Curette Bleeding: Moderate Hemostasis Achieved: Pressure End Time: 13:13 Response to Treatment: Procedure was tolerated well Level of Consciousness Awake and Alert (Post-procedure): Post Debridement Measurements of Total Wound Length: (cm) 4 Width: (cm) 3.5 Depth: (cm) 0.2 Volume: (cm) 2.199 Character of Wound/Ulcer Post Debridement: Stable Severity of Tissue Post Debridement: Fat layer exposed Post Procedure Diagnosis Same as Pre-procedure Electronic Signature(s) Signed: 08/12/2019 5:14:46 PM By: Linton Ham MD Signed: 08/12/2019 5:19:46 PM By: [name], [name], RN, CWS, [name], [name] Entered By: Linton Ham on 08/12/2019 13:24:50 Petree, [name] -------------------------------------------------------------------------------- HPI Details Patient Name: BRAILEY, LUPOLI. Date of Service: 08/12/2019 12:45 PM Medical Record Number: [id number] Patient Account Number: [id number] Date of Birth/Sex: 10-05-1946 (73 y.o. F) Treating RN: Cornell Barman Primary Care Provider: [name] Other Clinician: Referring Provider: [name] Treating Provider/Extender: Tito Dine in Treatment: 71 History of Present Illness HPI  Description: Pleasant 73 year old with history of chronic venous insufficiency. No diabetes or peripheral vascular disease. Left ABI 1.29. Questionable history of left lower extremity DVT. She developed a recurrent ulceration on her left lateral calf in December 2015, which she attributes to poor diet and subsequent lower extremity edema. She underwent endovenous laser ablation of her left greater saphenous vein in 2010. She underwent laser ablation of accessory branch of left GSV in April 2016 by Dr. Kellie Simmering at [facility]. She was previously wearing Unna boots, which she tolerated well. Tolerating 2 layer compression and cadexomer iodine. She  returns to clinic for follow-up and is without new complaints. She denies any significant pain at this time. She reports persistent pain with pressure. No claudication or ischemic rest pain. No fever or chills. No drainage. READMISSION 11/13/16; this is a 73 year old woman who is not a diabetic. She is here for a review of a painful area on her left medial lower extremity. I note that she was seen here previously last year for wound I believe to be in the same area. At that time she had undergone previously a left greater saphenous vein ablation by Dr. Kellie Simmering and she had a ablation of the anterior accessory branch of the left greater saphenous vein in March 2016. Seeing that the wound actually closed over. In reviewing the history with her today the ulcer in this area has been recurrent. She describes a biopsy of this area in 2009 that only showed stasis physiology. 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 Do not see that she has ever had arterial studies in the left leg. She tells me she has a follow-up with Dr. Kellie Simmering in roughly 10 days In  any case she developed the reopening of this area roughly a month ago. On the background of this she describes rapidly increasing edema which has responded to Lasix 40 mg and metolazone 2.5 mg as well as the patient's lymph massage. She has been told she has both venous insufficiency and lymphedema but she cannot tolerate compression stockings 11/28/16; the patient saw Dr. Kellie Simmering recently. Per the patient he did arterial Dopplers in the office that did not show evidence of arterial insufficiency, per the patient he stated "treat this like an ordinary venous ulcer". She also saw her dermatologist Dr. Ronnald Ramp who felt that this was more of a vascular ulcer. In general things are improving although she arrives today with increasing bilateral lower extremity edema with weeping a deeper fluid through the wound on the left medial leg compatible with some degree of lymphedema 12/04/16; the patient's wound is fully epithelialized but I don't think fully healed. We will do another week of depression with Promogran and TCA however I suspect we'll be able to discharge her next week. This is a very unusual-looking wound which was initially a figure-of-eight type wound lying on its side surrounded by petechial like hemorrhage. She has had venous ablation on this side. She apparently does not have an arterial issue per Dr. Kellie Simmering. She saw her dermatologist thought it was "vascular". Patient is definitely going to need ongoing compression and I talked about this with her today she will go to elastic therapy after she leaves here next week 12/11/16; the patient's wound is not completely closed today. She has surrounding scar tissue and in further discussion with the patient it would appear that she had ulcers in this area in 2009 for a prolonged period of time ultimately requiring a punch biopsy of this area that only showed venous insufficiency. I did not previously pickup on this part of the history from  the patient. 12/18/16; the patient's wound is completely epithelialized. There is no open area here. She has significant bilateral venous insufficiency with secondary lymphedema to a mild-to-moderate degree she does not have compression stockings.. She did not say anything to me when I was in  the room, she told our intake nurse that she was still having pain in this area. This isn't unusual recurrent small open area. She is going to go to elastic therapy to obtain compression stockings. 12/25/16; the patient's wound is fully epithelialized. There is no open area here. The patient describes some continued episodic discomfort in this area medial left calf. However everything looks fine and healed here. She is been to elastic therapy and caught herself 15-20 mmHg stockings, they apparently were having trouble getting 20-30 mm stockings in her size ZEPHANIAH, ENYEART ([id number]) 01/22/17; this is a patient we discharged from the clinic a month ago. She has a recurrent open wound on her medial left calf. She had 15 mm support stockings. I told her I thought she needed 20-30 mm compression stockings. She tells me that she has been ill with hospitalization secondary to asthma and is been found to have severe hypokalemia likely secondary to a combination of Lasix and metolazone. This morning she noted blistering and leaking fluid on the posterior part of her left leg. She called our intake nurse urgently and we was saw her this afternoon. She has not had any real discomfort here. I don't know that she's been wearing any stockings on this leg for at least 2-3 days. ABIs in this clinic were 1.21 on the right and 1.3 on the left. She is previously seen vascular surgery who does not think that there is a peripheral arterial issue. 01/30/17; Patient arrives with no open wound on the left leg. She has been to elastic therapy and obtained 20-56mmhg below knee stockings and she has one on the right leg  today. READMISSION 02/19/18; this Goetzinger is a now 73 year old patient we've had in this clinic perhaps 3 times before. I had last looked at her from January 07 December 2016 with an area on the medial left leg. We discharged her on 12/25/16 however she had to be readmitted on 01/22/17 with a recurrence. I have in my notes that we discharged her on 20-30 mm stockings although she tells me she was only wearing support hose because she cannot get stockings on predominantly related to her cervical spine surgery/issues. She has had previous ablations done by vein and vascular in [place] including a great saphenous vein ablation on the left with an anterior accessory branch ablation I think both of these were in 2016. On one of the previous visit she had a biopsy noted 2009 that was negative. She is not felt to have an arterial issue. She is not a diabetic. She does have a history of obstructive sleep apnea hypertension asthma as well as chronic venous insufficiency and lymphedema. On this occasion she noted 2 dry scaly patch on her left leg. She tried to put lotion on this it didn't really help. There were 2 open areas.the patient has been seeing her primary physician from 02/05/18 through 02/14/18. She had Unna boots applied. The superior wound now on the lateral left leg has closed but she's had one wound that remains open on the lateral left leg. This is not the same spot as we dealt with in 2018. ABIs in this clinic were 1.3 bilaterally 02/26/18; patient has a small wound on the left lateral calf. Dimensions are down. She has chronic venous insufficiency and lymphedema. 03/05/18; small open area on the left lateral calf. Dimensions are down. Tightly adherent necrotic debris over the surface of the wound which was difficult to remove. Also the dressing [over collagen] stuck to  the wound surface. This was removed with some difficulty as well. Change the primary dressing to Hydrofera Blue ready 03/12/18;  small open area on the left lateral calf. Comes in with tightly adherent surface eschar as well as some adherent Hydrofera Blue. 03/19/18; open area on the left lateral calf. Again adherent surface eschar as well as some adherent Hydrofera Blue nonviable subcutaneous tissue. She complained of pain all week even with the reduction from 4-3 layer compression I put on last week. Also she had an increase in her ankle and calf measurements probably related to the same thing. 03/26/18; open area on the left lateral calf. A very small open area remains here. We used silver alginate starting last week as the Hydrofera Blue seem to stick to the wound bed. In using 4-layer compression 04/02/18; the open area in the left lateral calf at some adherent slough which I removed there is no open area here. We are able to transition her into her own compression stocking. Truthfully I think this is probably his support hose. However this does not maintain skin integrity will be limited. She cannot put over the toe compression stockings on because of neck problems hand problems etc. She is allergic to the lining layer of juxta lites. We might be forced to use extremitease stocking should this fail READMIT 11/24/2018 Patient is now a 73 year old woman who is not a diabetic. She has been in this clinic on at least 3 previous occasions largely with recurrent wounds on her left leg secondary to chronic venous insufficiency with secondary lymphedema. Her situation is complicated by inability to get stockings on and an allergy to neoprene which is apparently a component and at least juxta lites and other stockings. As a result she really has not been wearing any stockings on her legs. She tells [place] that roughly 2 or 3 weeks ago she started noticing a stinging sensation just above her ankle on the left medial aspect. She has been diagnosed with pseudogout and she wondered whether this was what she was experiencing. She tried to  dress this with something she bought at the store however subsequently it pulled skin off and now she has an open wound that is not improving. She has been using Vaseline gauze with a cover bandage. She saw her primary doctor last week who put an Haematologist on her. ABIs in this clinic was 1.03 on the left [name], SPEGAL. ([id number]) 2/12; the area is on the left medial ankle. Odd-looking wound with what looks to be surface epithelialization but a multitude of small petechial openings. This clearly not closed yet. We have been using silver alginate under 3 layer compression with TCA 2/19; the wound area did not look quite as good this week. Necrotic debris over the majority of the wound surface which required debridement. She continues to have a multitude of what looked to be small petechial openings. She reminds [place] that she had a biopsy on this initially during her first outbreak in 2015 in [place] [facility]. She expresses concern about this being a possible melanoma. 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 I assured her that this does not look anything like melanoma. Except for the petechial reaction it does look like a venous insufficiency area and she certainly has evidence of this on both sides 2/26; a difficult area on the left medial ankle. The patient clearly has chronic venous hypertension with some  degree of lymphedema. The odd thing about the area is the small petechial hemorrhages. I am not really sure how to explain this. This was present last time and this is not a compression injury. We have been using Hydrofera Blue which I changed to last week 3/4; still using Hydrofera Blue. Aggressive debridement today. She does not have known arterial issues. She has seen Dr. Kellie Simmering at [facility] vein and vascular and and has an ablation on the left. [Anterior accessory branch of the  greater saphenous]. From what I remember they did not feel she had an arterial issue. The patient has had this area biopsied in 2009 at [facility] dermatology and by her recollection they said this was "stasis". She is also follow-up with dermatology locally who thought that this was more of a vascular issue 3/11; using Hydrofera Blue. Aggressive debridement today. She does not have an arterial issue. We are using 3 layer compression although we may need to go to 4. The patient has been in for multiple changes to her wrap since I last saw her a week ago. She says that the area was leaking. I do not have too much more information on what was found 01/19/19 on evaluation today patient was actually being seen for a nurse visit when unfortunately she had the area on her left lateral lower extremity as well as weeping from the right lower extremity that became apparent. Therefore we did end up actually seeing her for a full visit with myself. She is having some pain at this site as well but fortunately nothing too significant at this point. No fevers, chills, nausea, or vomiting noted at this time. 3/18-Patient is back to the clinic with the left leg venous leg ulcer, the ulcer is larger in size, has a surface that is densely adherent with fibrinous tissue, the Hydrofera Blue was used but is densely adherent and there was difficulty in removing it. The right lower extremity was also wrapped for weeping edema. Patient has a new area over the left lateral foot above the malleolus that is small and appears to have no debris with intact surrounding skin. Patient is on increased dose of Lasix also as a means to edema management 3/25; the patient has a nonhealing venous ulcer on the medial left leg and last week developed a smaller area on the lateral left calf. We have been using Hydrofera Blue with a contact layer. 4/1; no major change in these wounds areas. Left medial and more recently left lateral calf. I  tried Iodoflex last week to aid in debridement she did not tolerate this. She stated her pain was terrible all week. She took the top layer of the 4 layer compression off. 4/8; the patient actually looks somewhat better in terms of her more prominent left lateral calf wound. There is some healthy looking tissue here. She is still complaining of a lot of discomfort. 4/15; patient in a lot of pain secondary to sciatica. She is on a prednisone taper prescribed by her primary physician. She has the 2 areas one on the left medial and more recently a smaller area on the left lateral calf. Both of these just above the malleoli 4/22; her back pain is better but she still states she is very uncomfortable and now feels she is intolerant to the [employer]. No real change in the wounds we have been using Sorbact. She has been previously intolerant to Iodoflex. There is not a lot of option about what we can use  to debride this wound under compression that she no doubt needs. sHe states Ultram no longer works for her pain 4/29; no major change in the wounds slightly increased depth. Surface on the original medial wound perhaps somewhat improved however the more recent area on the lateral left ankle is 100% covered in very adherent debris we have been using Sorbact. She tolerates 4 layer compression well and her edema control is a lot better. She has not had to come in for a nurse check 5/6; no major change in the condition of the wounds. She did consent to debridement today which was done with some difficulty. Continuing Sorbact. She did not tolerate Iodoflex. She was in for a check of her compression the day after we wrapped her last week this was adjusted but nothing much was found 5/13; no major change in the condition or area of the wounds. I was able to get a fairly aggressive debridement done on the lateral left leg wound. Even using Sorbact under compression. She came back in on Friday to have the wrap  changed. She says she felt uncomfortable on the lateral aspect of her ankle. She has a long history of chronic venous insufficiency including previous ablation surgery on this side. 5/20-Patient returns for wounds on left leg with both wounds covered in slough, with the lateral leg wound larger in size, she has been in 3 layer compression and felt more comfortable, she describes pain in ankle, in leg and pins and needles in foot, and is about to try Pamelor for this 6/3; wounds on the left lateral and left medial leg. The area medially which is the most recent of the 2 seems to have had the largest increase in dimensions. We have been using Sorbac to try and debride the surface. She has been to see orthopedics they apparently did a plain x-ray that was indeterminant. Diagnosed her with neuropathy and they have ordered an MRI to VAN, [name]. ([id number]) determine if there is underlying osteomyelitis. This was not high on my thought list but I suppose it is prudent. We have advised her to make an appointment with vein and vascular in [place]. She has a history of a left greater saphenous and accessory vein ablations I wonder if there is anything else that can be done from a surgical point of view to help in these difficult refractory wounds. We have previously healed this wound on one occasion but it keeps on reopening [medial side] 6/10; deep tissue culture I did last week I think on the left medial wound showed both moderate E. coli and moderate staph aureus [MSSA]. She is going to require antibiotics and I have chosen Augmentin. We have been using Sorbact and we have made better looking wound surface on both sides but certainly no improvement in wound area. She was back in last Friday apparently for a dressing changes the wrap was hurting her outer left ankle. She has not managed to get a hold of vein and vascular in [place]. We are going to have to make her that appointment 6/17;  patient is tolerating the Augmentin. She had an MRI that I think was ordered by orthopedic surgeon this did not show osteomyelitis or an abscess did suggest cellulitis. We have been using Sorbact to the lateral and medial ankles. We have been trying to arrange a follow-up appointment with vein and vascular in [place] or did her original ablations. We apparently an area sent the request to vein and vascular in [facility]  6/24; patient has completed the Augmentin. We do not yet have a vein and vascular appointment in [place]. I am not sure what the issue is here we have asked her to call tomorrow. We are using Sorbact. Making some improvements and especially the medial wound. Both surfaces however look better medial and lateral. 7/1; the patient has been in contact with vein and vascular in [place] but has not yet received an appointment. Using Sorbact we have gradually improve the wound surface with no improvement in surface area. She is approved for Apligraf but the wound surface still is not completely viable. She has not had to come in for a dressing change 7/8; the patient has an appointment with vein and vascular on 7/31 which is a Friday afternoon. She is concerned about getting back here for [place] to dress her wounds. I think it is important to have them goal for her venous reflux/history of ablations etc. to see if anything else can be done. She apparently tested positive for 1 of the blood tests with regards to lupus and saw a rheumatologist. He has raised the issue of vasculitis again. I have had this thought in the past however the evidence seems overwhelming that this is a venous reflux etiology. If the rheumatologist tells me there is clinical and laboratory investigation is positive for lupus I will rethink this. 7/15; the patient's wound surfaces are quite a bit better. The medial area which was her original wound now has no depth although the lateral wound which was the more  recent area actually appears larger. Both with viable surfaces which is indeed better. Using Sorbact. I wanted to use Apligraf on her however there is the issue of the vein and vascular appointment on 7/31 at 2:00 in the afternoon which would not allow her to get back to be rewrapped and they would no doubt remove the graft 7/22; the patient's wound surfaces have moderate amount of debris although generally look better. The lateral one is larger with 2 small satellite areas superiorly. We are waiting for her vein and vascular appointment on 7/31. She has been approved for Apligraf which I would like to use after th 7/29; wound surfaces have improved no debridement is required we have been using Sorbact. She sees vein and vascular on Friday with this so question of whether anything can be done to lessen the likelihood of recurrence and/or speed the healing of these areas. She is already had previous ablations. She no doubt has severe venous hypertension 8/5-Patient returns at 1 week, she was in [place] for 3 days by her podiatrist, we have been using so backed to the wound, she has increased pain in both the wounds on the left lower leg especially the more distal one on the lateral aspect 8/12-Patient returns at 1 week and she is agreeable to having debridement in both wounds on her left leg today. We have been using Sorbact, and vascular studies were reviewed at last visit 8/19; the patient arrives with her wounds fairly clean and no debridement is required. We have used Sorbact which is really done a nice job in cleaning up these very difficult wound surfaces. The patient saw Dr. Donzetta Matters of vascular surgery on 7/31. He did not feel that there was an arterial component. He felt that her treated greater saphenous vein is adequately addressed and that the small saphenous vein did not appear to be involved significantly. She was also noted to have deep venous reflux which is not treatable.  Dr. Donzetta Matters  mentioned the possibility of a central obstructive component leading to reflux and he offered her central venography. She wanted to discuss this or think about it. I have urged her to go ahead with this. She has had recurrent difficult wounds in these areas which do heal but after months in the clinic. If there is anything that can be done to reduce the likelihood of this I think it is worth it. 9/2 she is still working towards getting follow-up with Dr. Donzetta Matters to schedule her CT. Things are quite a bit worse venography. I put Apligraf on 2 weeks ago on both wounds on the medial and lateral part of her left lower leg. She arrives in clinic today with 3 superficial additional wounds above the area laterally and one below the wound medially. She describes a lot of discomfort. I think these are probably wrapped injuries. Does not look like she has cellulitis. 07/20/2019 on evaluation today patient appears to be doing somewhat poorly in regard to her lower extremity ulcers. She in fact showed signs of erythema in fact we may even be dealing with an infection at this time. Unfortunately I am unsure if this is just infection or if indeed there may be some allergic reaction that occurred as a result of the Apligraf application. With that being said that would be unusual but nonetheless not impossible in this patient is one who is unfortunately allergic to quite a bit. Currently we have been using the Sorbact which seems to do as well as anything for her. I do think we may want to obtain a culture today to see if there is anything showing up there that may need to be addressed. MONTINIQUE, CZARNY ([id number]) 9/16; noted that last week the wounds look worse in 1 week follow-up of the Apligraf. Using Sorbact as of 2 days ago. She arrives with copious amounts of drainage and new skin breakdown on the back of the left calf. The wounds arm more substantial bilaterally. There is a fair amount of swelling in the left  calf no overt DVT there is edema present I think in the left greater than right thigh. She is supposed to go on 9/28 for CT venography. The wounds on the medial and lateral calf are worse and she has new skin breakdown posteriorly at least new for me. This is almost developing into a circumferential wound area The Apligraf was taken off last week which I agree with things are not going in the right direction a culture was done we do not have that back yet. She is on Augmentin that she started 2 days ago 9/23; dressing was changed by her nurses on Monday. In general there is no improvement in the wound areas although the area looks less angry than last week. She did get Augmentin for MSSA cultured on the 14th. She still appears to have too much swelling in the left leg even with 3 layer compression 9/30; the patient underwent her procedure on 9/28 by Dr. [facility]. She was discovered to have the common iliac vein measuring 12.2 mm but at the level of L4-L5 measured 3 mm. After stenting it measured 10 mm. 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 Rouleaux flow in the common femoral and femoral vein was observed much improved after stenting. We are using silver alginate to the wounds on the medial and lateral ankle on the left. 4 layer compression 10/7; the patient had fluid  swelling around her knee and 4 layer compression. At the advice of vein and vascular this was reduced to 3 layer which she is tolerating better. We have been using silver alginate under 3 layer compression since last Friday Electronic Signature(s) Signed: 08/12/2019 5:14:46 PM By: Linton Ham MD Entered By: Linton Ham on 08/12/2019 13:25:46 Schwering, [name] -------------------------------------------------------------------------------- Physical Exam Details Patient Name: ARLOWE, MARTINIE. Date of Service: 08/12/2019 12:45 PM Medical Record Number:  [id number] Patient Account Number: [id number] Date of Birth/Sex: 1946/08/11 (73 y.o. F) Treating RN: Cornell Barman Primary Care Provider: [name] Other Clinician: Referring Provider: [name] Treating Provider/Extender: Tito Dine in Treatment: 64 Constitutional Patient is hypertensive.. Pulse regular and within target range for patient.Marland Kitchen Respirations regular, non-labored and within target range.. Temperature is normal and within the target range for the patient.Marland Kitchen appears in no distress. Notes Wound exam; the 2 original wounds on the left medial and subsequently the left lateral ankle area both require debridement with an open curette hemostasis with direct pressure. Around the lateral wound the superficial areas seem better to me there is still erythema in this area that I think is venous stasis. I did not see any evidence of cellulitis Electronic Signature(s) Signed: 08/12/2019 5:14:46 PM By: Linton Ham MD Entered By: Linton Ham on 08/12/2019 13:26:45 MAYKAYLA, RAWN ([id number]) -------------------------------------------------------------------------------- Physician Orders Details Patient Name: MARYE, DICOSTANZO. Date of Service: 08/12/2019 12:45 PM Medical Record Number: [id number] Patient Account Number: [id number] Date of Birth/Sex: Jun 04, 1946 (73 y.o. F) Treating RN: Cornell Barman Primary Care Provider: [name] Other Clinician: Referring Provider: [name] Treating Provider/Extender: Tito Dine in Treatment: 12 Verbal / Phone Orders: No Diagnosis Coding Wound Cleansing Wound #5 Left,Medial Lower Leg o Cleanse wound with mild soap and water Wound #6 Left,Lateral Lower Leg o Cleanse wound with mild soap and water Wound #9 Left,Lateral,Posterior Lower Leg o Cleanse wound with mild soap and water Anesthetic (add to Medication List) Wound #5 Left,Medial Lower Leg o Topical Lidocaine 4% cream applied to wound  bed prior to debridement (In Clinic Only). Wound #6 Left,Lateral Lower Leg o Topical Lidocaine 4% cream applied to wound bed prior to debridement (In Clinic Only). Wound #9 Left,Lateral,Posterior Lower Leg o Topical Lidocaine 4% cream applied to wound bed prior to debridement (In Clinic Only). Skin Barriers/Peri-Wound Care Wound #5 Left,Medial Lower Leg o Triamcinolone Acetonide Ointment (TCA) Wound #6 Left,Lateral Lower Leg o Triamcinolone Acetonide Ointment (TCA) Wound #9 Left,Lateral,Posterior Lower Leg o Triamcinolone Acetonide Ointment (TCA) Primary Wound Dressing Wound #5 Left,Medial Lower Leg o Silver Alginate Wound #6 Left,Lateral Lower Leg o Silver Alginate Wound #9 Left,Lateral,Posterior Lower Leg o Silver Alginate Secondary Dressing Wound #5 Left,Medial Lower Leg o Drawtex ARACELYS, LEUCK. ([id number]) Wound #6 Left,Lateral Lower Leg o Drawtex Wound #9 Left,Lateral,Posterior Lower Leg o Drawtex Dressing Change Frequency Wound #5 Left,Medial Lower Leg o Change dressing every week o Other: - as needed. Wound #6 Left,Lateral Lower Leg o Change dressing every week o Other: - as needed. Wound #9 Left,Lateral,Posterior Lower Leg o Change dressing every week o Other: - as needed. Follow-up Appointments Wound #5 Left,Medial Lower Leg o Return Appointment in 1 week. o Nurse Visit as needed - Call of needed Wound #6 Left,Lateral Lower Leg o Return Appointment in 1 week. o Nurse Visit as needed - Call of needed Wound #9 Left,Lateral,Posterior Lower Leg o Return Appointment in 1 week. o Nurse Visit as needed - Call of needed Edema Control Wound #5  Left,Medial Lower Leg o 3 Layer Compression System - Left Lower Extremity - continue all other wound care orders Wound #6 Left,Lateral Lower Leg o 3 Layer Compression System - Left Lower Extremity - continue all other wound care orders Wound #9 Left,Lateral,Posterior Lower  Leg o 3 Layer Compression System - Left Lower Extremity - continue all other wound care orders Electronic Signature(s) Signed: 08/12/2019 5:14:46 PM By: Linton Ham MD Signed: 08/12/2019 5:19:46 PM By: [name], [name], RN, CWS, [name], [name] Entered By: [name], [name], RN, CWS, Kim on 08/12/2019 13:18:34 [name] -------------------------------------------------------------------------------- Problem List Details Patient Name: SHEYANNE, GRILLOT. Date of Service: 08/12/2019 12:45 PM Medical Record Number: [id number] Patient Account Number: [id number] Date of Birth/Sex: 04-27-1946 (73 y.o. F) Treating RN: Cornell Barman Primary Care Provider: [name] Other Clinician: Referring Provider: [name] Treating Provider/Extender: Tito Dine in Treatment: 35 Active Problems ICD-10 Evaluated Encounter Code Description Active Date Today Diagnosis L97.221 Non-pressure chronic ulcer of left calf limited to breakdown of 01/07/2019 No Yes skin I87.321 Chronic venous hypertension (idiopathic) with inflammation of 12/10/2018 No Yes right lower extremity I89.0 Lymphedema, not elsewhere classified 12/10/2018 No Yes Inactive Problems Resolved Problems Electronic Signature(s) Signed: 08/12/2019 5:14:46 PM By: Linton Ham MD Entered By: Linton Ham on 08/12/2019 13:24:09 [name] -------------------------------------------------------------------------------- Progress Note Details Patient Name: HAZELL, COMELLA. Date of Service: 08/12/2019 12:45 PM Medical Record Number: [id number] Patient Account Number: [id number] Date of Birth/Sex: [birth year] (73 y.o. F) Treating RN: Cornell Barman Primary Care Provider: [name] Other Clinician: Referring Provider: [name] Treating Provider/Extender: Tito Dine in Treatment: 35 Subjective History of Present Illness (HPI) Pleasant 73 year old with history of chronic venous  insufficiency. No diabetes or peripheral vascular disease. Left ABI 1.29. Questionable history of left lower extremity DVT. She developed a recurrent ulceration on her left lateral calf in December 2015, which she attributes to poor diet and subsequent lower extremity edema. She underwent endovenous laser ablation of her left greater saphenous vein in 2010. She underwent laser ablation of accessory branch of left GSV in April 2016 by Dr. Kellie Simmering at [facility]. She was previously wearing Unna boots, which she tolerated well. Tolerating 2 layer compression and cadexomer iodine. She returns to clinic for follow-up and is without new complaints. She denies any significant pain at this time. She reports persistent pain with pressure. No claudication or ischemic rest pain. No fever or chills. No drainage. READMISSION 11/13/16; this is a 73 year old woman who is not a diabetic. She is here for a review of a painful area on her left medial lower extremity. I note that she was seen here previously last year for wound I believe to be in the same area. At that time she had undergone previously a left greater saphenous vein ablation by Dr. Kellie Simmering and she had a ablation of the anterior accessory branch of the left greater saphenous vein in March 2016. Seeing that the wound actually closed over. In reviewing the history with her today the ulcer in this area has been recurrent. She describes a biopsy of this area in 2009 that only showed stasis physiology. 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 Do not see that she has ever had arterial studies in the left leg.  She tells me she has a follow-up with Dr. Kellie Simmering in roughly 10 days In any case she developed the reopening of this area roughly a month  ago. On the background of this she describes rapidly increasing edema which has responded to Lasix 40 mg and metolazone 2.5 mg as well as the patient's lymph massage. She has been told she has both venous insufficiency and lymphedema but she cannot tolerate compression stockings 11/28/16; the patient saw Dr. Kellie Simmering recently. Per the patient he did arterial Dopplers in the office that did not show evidence of arterial insufficiency, per the patient he stated "treat this like an ordinary venous ulcer". She also saw her dermatologist Dr. Ronnald Ramp who felt that this was more of a vascular ulcer. In general things are improving although she arrives today with increasing bilateral lower extremity edema with weeping a deeper fluid through the wound on the left medial leg compatible with some degree of lymphedema 12/04/16; the patient's wound is fully epithelialized but I don't think fully healed. We will do another week of depression with Promogran and TCA however I suspect we'll be able to discharge her next week. This is a very unusual-looking wound which was initially a figure-of-eight type wound lying on its side surrounded by petechial like hemorrhage. She has had venous ablation on this side. She apparently does not have an arterial issue per Dr. Kellie Simmering. She saw her dermatologist thought it was "vascular". Patient is definitely going to need ongoing compression and I talked about this with her today she will go to elastic therapy after she leaves here next week 12/11/16; the patient's wound is not completely closed today. She has surrounding scar tissue and in further discussion with the patient it would appear that she had ulcers in this area in 2009 for a prolonged period of time ultimately requiring a punch biopsy of this area that only showed venous insufficiency. I did not previously pickup on this part of the history from the patient. 12/18/16; the patient's wound is completely epithelialized. There  is no open area here. She has significant bilateral venous insufficiency with secondary lymphedema to a mild-to-moderate degree she does not have compression stockings.. She did not say anything to me when I was in the room, she told our intake nurse that she was still having pain in this area. This isn't unusual recurrent small open area. She is going to go to elastic therapy to obtain compression stockings. 12/25/16; the patient's wound is fully epithelialized. There is no open area here. The patient describes some continued episodic discomfort in this area medial left calf. However everything looks fine and healed here. She is been to elastic therapy and LENELLE, BLAKESLEE ([id number]) caught herself 15-20 mmHg stockings, they apparently were having trouble getting 20-30 mm stockings in her size 01/22/17; this is a patient we discharged from the clinic a month ago. She has a recurrent open wound on her medial left calf. She had 15 mm support stockings. I told her I thought she needed 20-30 mm compression stockings. She tells me that she has been ill with hospitalization secondary to asthma and is been found to have severe hypokalemia likely secondary to a combination of Lasix and metolazone. This morning she noted blistering and leaking fluid on the posterior part of her left leg. She called our intake nurse urgently and we was saw her this afternoon. She has not had any real discomfort here. I don't know that she's been wearing any stockings on this leg  for at least 2-3 days. ABIs in this clinic were 1.21 on the right and 1.3 on the left. She is previously seen vascular surgery who does not think that there is a peripheral arterial issue. 01/30/17; Patient arrives with no open wound on the left leg. She has been to elastic therapy and obtained 20-38mmhg below knee stockings and she has one on the right leg today. READMISSION 02/19/18; this Prentis is a now 73 year old patient we've had in this clinic  perhaps 3 times before. I had last looked at her from January 07 December 2016 with an area on the medial left leg. We discharged her on 12/25/16 however she had to be readmitted on 01/22/17 with a recurrence. I have in my notes that we discharged her on 20-30 mm stockings although she tells me she was only wearing support hose because she cannot get stockings on predominantly related to her cervical spine surgery/issues. She has had previous ablations done by vein and vascular in [place] including a great saphenous vein ablation on the left with an anterior accessory branch ablation I think both of these were in 2016. On one of the previous visit she had a biopsy noted 2009 that was negative. She is not felt to have an arterial issue. She is not a diabetic. She does have a history of obstructive sleep apnea hypertension asthma as well as chronic venous insufficiency and lymphedema. On this occasion she noted 2 dry scaly patch on her left leg. She tried to put lotion on this it didn't really help. There were 2 open areas.the patient has been seeing her primary physician from 02/05/18 through 02/14/18. She had Unna boots applied. The superior wound now on the lateral left leg has closed but she's had one wound that remains open on the lateral left leg. This is not the same spot as we dealt with in 2018. ABIs in this clinic were 1.3 bilaterally 02/26/18; patient has a small wound on the left lateral calf. Dimensions are down. She has chronic venous insufficiency and lymphedema. 03/05/18; small open area on the left lateral calf. Dimensions are down. Tightly adherent necrotic debris over the surface of the wound which was difficult to remove. Also the dressing [over collagen] stuck to the wound surface. This was removed with some difficulty as well. Change the primary dressing to Hydrofera Blue ready 03/12/18; small open area on the left lateral calf. Comes in with tightly adherent surface eschar as well as  some adherent Hydrofera Blue. 03/19/18; open area on the left lateral calf. Again adherent surface eschar as well as some adherent Hydrofera Blue nonviable subcutaneous tissue. She complained of pain all week even with the reduction from 4-3 layer compression I put on last week. Also she had an increase in her ankle and calf measurements probably related to the same thing. 03/26/18; open area on the left lateral calf. A very small open area remains here. We used silver alginate starting last week as the Hydrofera Blue seem to stick to the wound bed. In using 4-layer compression 04/02/18; the open area in the left lateral calf at some adherent slough which I removed there is no open area here. We are able to transition her into her own compression stocking. Truthfully I think this is probably his support hose. However this does not maintain skin integrity will be limited. She cannot put over the toe compression stockings on because of neck problems hand problems etc. She is allergic to the lining layer of juxta lites.  We might be forced to use extremitease stocking should this fail READMIT 11/24/2018 Patient is now a 73 year old woman who is not a diabetic. She has been in this clinic on at least 3 previous occasions largely with recurrent wounds on her left leg secondary to chronic venous insufficiency with secondary lymphedema. Her situation is complicated by inability to get stockings on and an allergy to neoprene which is apparently a component and at least juxta lites and other stockings. As a result she really has not been wearing any stockings on her legs. She tells [place] that roughly 2 or 3 weeks ago she started noticing a stinging sensation just above her ankle on the left medial aspect. She has been diagnosed with pseudogout and she wondered whether this was what she was experiencing. She tried to dress this with something she bought at the store however subsequently it pulled skin off and now  she has an open wound that is not improving. She has been using Vaseline gauze with a cover bandage. She saw her primary doctor last week who put an Haematologist on her. ABIs in this clinic was 1.03 on the left PANAYIOTA, BRANDNER. ([id number]) 2/12; the area is on the left medial ankle. Odd-looking wound with what looks to be surface epithelialization but a multitude of small petechial openings. This clearly not closed yet. We have been using silver alginate under 3 layer compression with TCA 2/19; the wound area did not look quite as good this week. Necrotic debris over the majority of the wound surface which required debridement. She continues to have a multitude of what looked to be small petechial openings. She reminds [place] that she had a biopsy on this initially during her first outbreak in 2015 in [place] dermatology. She expresses concern about this being a possible melanoma. 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 I assured her that this does not look anything like melanoma. Except for the petechial reaction it does look like a venous insufficiency area and she certainly has evidence of this on both sides 2/26; a difficult area on the left medial ankle. The patient clearly has chronic venous hypertension with some degree of lymphedema. The odd thing about the area is the small petechial hemorrhages. I am not really sure how to explain this. This was present last time and this is not a compression injury. We have been using Hydrofera Blue which I changed to last week 3/4; still using Hydrofera Blue. Aggressive debridement today. She does not have known arterial issues. She has seen Dr. Kellie Simmering at [facility] vein and vascular and and has an ablation on the left. [Anterior accessory branch of the greater saphenous]. From what I remember they did not feel she had an arterial issue. The patient has had this area  biopsied in 2009 at [facility] dermatology and by her recollection they said this was "stasis". She is also follow-up with dermatology locally who thought that this was more of a vascular issue 3/11; using Hydrofera Blue. Aggressive debridement today. She does not have an arterial issue. We are using 3 layer compression although we may need to go to 4. The patient has been in for multiple changes to her wrap since I last saw her a week ago. She says that the area was leaking. I do not have too much more information on what was found 01/19/19 on evaluation today patient was actually being seen for a  nurse visit when unfortunately she had the area on her left lateral lower extremity as well as weeping from the right lower extremity that became apparent. Therefore we did end up actually seeing her for a full visit with myself. She is having some pain at this site as well but fortunately nothing too significant at this point. No fevers, chills, nausea, or vomiting noted at this time. 3/18-Patient is back to the clinic with the left leg venous leg ulcer, the ulcer is larger in size, has a surface that is densely adherent with fibrinous tissue, the Hydrofera Blue was used but is densely adherent and there was difficulty in removing it. The right lower extremity was also wrapped for weeping edema. Patient has a new area over the left lateral foot above the malleolus that is small and appears to have no debris with intact surrounding skin. Patient is on increased dose of Lasix also as a means to edema management 3/25; the patient has a nonhealing venous ulcer on the medial left leg and last week developed a smaller area on the lateral left calf. We have been using Hydrofera Blue with a contact layer. 4/1; no major change in these wounds areas. Left medial and more recently left lateral calf. I tried Iodoflex last week to aid in debridement she did not tolerate this. She stated her pain was terrible all week.  She took the top layer of the 4 layer compression off. 4/8; the patient actually looks somewhat better in terms of her more prominent left lateral calf wound. There is some healthy looking tissue here. She is still complaining of a lot of discomfort. 4/15; patient in a lot of pain secondary to sciatica. She is on a prednisone taper prescribed by her primary physician. She has the 2 areas one on the left medial and more recently a smaller area on the left lateral calf. Both of these just above the malleoli 4/22; her back pain is better but she still states she is very uncomfortable and now feels she is intolerant to the [employer]. No real change in the wounds we have been using Sorbact. She has been previously intolerant to Iodoflex. There is not a lot of option about what we can use to debride this wound under compression that she no doubt needs. sHe states Ultram no longer works for her pain 4/29; no major change in the wounds slightly increased depth. Surface on the original medial wound perhaps somewhat improved however the more recent area on the lateral left ankle is 100% covered in very adherent debris we have been using Sorbact. She tolerates 4 layer compression well and her edema control is a lot better. She has not had to come in for a nurse check 5/6; no major change in the condition of the wounds. She did consent to debridement today which was done with some difficulty. Continuing Sorbact. She did not tolerate Iodoflex. She was in for a check of her compression the day after we wrapped her last week this was adjusted but nothing much was found 5/13; no major change in the condition or area of the wounds. I was able to get a fairly aggressive debridement done on the lateral left leg wound. Even using Sorbact under compression. She came back in on Friday to have the wrap changed. She says she felt uncomfortable on the lateral aspect of her ankle. She has a long history of chronic venous  insufficiency including previous ablation surgery on this side. 5/20-Patient returns for  wounds on left leg with both wounds covered in slough, with the lateral leg wound larger in size, she has been in 3 layer compression and felt more comfortable, she describes pain in ankle, in leg and pins and needles in foot, and is about to try Pamelor for this 6/3; wounds on the left lateral and left medial leg. The area medially which is the most recent of the 2 seems to have had the largest increase in dimensions. We have been using Sorbac to try and debride the surface. She has been to see orthopedics LISE, KOVALCHIK ([id number]) they apparently did a plain x-ray that was indeterminant. Diagnosed her with neuropathy and they have ordered an MRI to determine if there is underlying osteomyelitis. This was not high on my thought list but I suppose it is prudent. We have advised her to make an appointment with vein and vascular in [place]. She has a history of a left greater saphenous and accessory vein ablations I wonder if there is anything else that can be done from a surgical point of view to help in these difficult refractory wounds. We have previously healed this wound on one occasion but it keeps on reopening [medial side] 6/10; deep tissue culture I did last week I think on the left medial wound showed both moderate E. coli and moderate staph aureus [MSSA]. She is going to require antibiotics and I have chosen Augmentin. We have been using Sorbact and we have made better looking wound surface on both sides but certainly no improvement in wound area. She was back in last Friday apparently for a dressing changes the wrap was hurting her outer left ankle. She has not managed to get a hold of vein and vascular in [place]. We are going to have to make her that appointment 6/17; patient is tolerating the Augmentin. She had an MRI that I think was ordered by orthopedic surgeon this did not  show osteomyelitis or an abscess did suggest cellulitis. We have been using Sorbact to the lateral and medial ankles. We have been trying to arrange a follow-up appointment with vein and vascular in [place] or did her original ablations. We apparently an area sent the request to vein and vascular in [facility] 6/24; patient has completed the Augmentin. We do not yet have a vein and vascular appointment in [place]. I am not sure what the issue is here we have asked her to call tomorrow. We are using Sorbact. Making some improvements and especially the medial wound. Both surfaces however look better medial and lateral. 7/1; the patient has been in contact with vein and vascular in [employer] but has not yet received an appointment. Using Sorbact we have gradually improve the wound surface with no improvement in surface area. She is approved for Apligraf but the wound surface still is not completely viable. She has not had to come in for a dressing change 7/8; the patient has an appointment with vein and vascular on 7/31 which is a Friday afternoon. She is concerned about getting back here for [place] to dress her wounds. I think it is important to have them goal for her venous reflux/history of ablations etc. to see if anything else can be done. She apparently tested positive for 1 of the blood tests with regards to lupus and saw a rheumatologist. He has raised the issue of vasculitis again. I have had this thought in the past however the evidence seems overwhelming that this is a venous reflux etiology. If the  rheumatologist tells me there is clinical and laboratory investigation is positive for lupus I will rethink this. 7/15; the patient's wound surfaces are quite a bit better. The medial area which was her original wound now has no depth although the lateral wound which was the more recent area actually appears larger. Both with viable surfaces which is indeed better. Using Sorbact. I wanted to  use Apligraf on her however there is the issue of the vein and vascular appointment on 7/31 at 2:00 in the afternoon which would not allow her to get back to be rewrapped and they would no doubt remove the graft 7/22; the patient's wound surfaces have moderate amount of debris although generally look better. The lateral one is larger with 2 small satellite areas superiorly. We are waiting for her vein and vascular appointment on 7/31. She has been approved for Apligraf which I would like to use after th 7/29; wound surfaces have improved no debridement is required we have been using Sorbact. She sees vein and vascular on Friday with this so question of whether anything can be done to lessen the likelihood of recurrence and/or speed the healing of these areas. She is already had previous ablations. She no doubt has severe venous hypertension 8/5-Patient returns at 1 week, she was in [place] for 3 days by her podiatrist, we have been using so backed to the wound, she has increased pain in both the wounds on the left lower leg especially the more distal one on the lateral aspect 8/12-Patient returns at 1 week and she is agreeable to having debridement in both wounds on her left leg today. We have been using Sorbact, and vascular studies were reviewed at last visit 8/19; the patient arrives with her wounds fairly clean and no debridement is required. We have used Sorbact which is really done a nice job in cleaning up these very difficult wound surfaces. The patient saw Dr. Donzetta Matters of vascular surgery on 7/31. He did not feel that there was an arterial component. He felt that her treated greater saphenous vein is adequately addressed and that the small saphenous vein did not appear to be involved significantly. She was also noted to have deep venous reflux which is not treatable. Dr. Donzetta Matters mentioned the possibility of a central obstructive component leading to reflux and he offered her central venography.  She wanted to discuss this or think about it. I have urged her to go ahead with this. She has had recurrent difficult wounds in these areas which do heal but after months in the clinic. If there is anything that can be done to reduce the likelihood of this I think it is worth it. 9/2 she is still working towards getting follow-up with Dr. Donzetta Matters to schedule her CT. Things are quite a bit worse venography. I put Apligraf on 2 weeks ago on both wounds on the medial and lateral part of her left lower leg. She arrives in clinic today with 3 superficial additional wounds above the area laterally and one below the wound medially. She describes a lot of discomfort. I think these are probably wrapped injuries. Does not look like she has cellulitis. 07/20/2019 on evaluation today patient appears to be doing somewhat poorly in regard to her lower extremity ulcers. She in fact showed signs of erythema in fact we may even be dealing with an infection at this time. Unfortunately I am unsure if this is just infection or if indeed there may be some allergic reaction that  occurred as a result of the Apligraf application. With that being said that would be unusual but nonetheless not impossible in this patient is one who is unfortunately allergic to quite a bit. Currently we have been using the Sorbact which seems to do as well as anything for her. I do think we may want to obtain FUMIYE, BASCO ([id number]) a culture today to see if there is anything showing up there that may need to be addressed. 9/16; noted that last week the wounds look worse in 1 week follow-up of the Apligraf. Using Sorbact as of 2 days ago. She arrives with copious amounts of drainage and new skin breakdown on the back of the left calf. The wounds arm more substantial bilaterally. There is a fair amount of swelling in the left calf no overt DVT there is edema present I think in the left greater than right thigh. She is supposed to go on 9/28  for CT venography. The wounds on the medial and lateral calf are worse and she has new skin breakdown posteriorly at least new for me. This is almost developing into a circumferential wound area The Apligraf was taken off last week which I agree with things are not going in the right direction a culture was done we do not have that back yet. She is on Augmentin that she started 2 days ago 9/23; dressing was changed by her nurses on Monday. In general there is no improvement in the wound areas although the area looks less angry than last week. She did get Augmentin for MSSA cultured on the 14th. She still appears to have too much swelling in the left leg even with 3 layer compression 9/30; the patient underwent her procedure on 9/28 by Dr. Donzetta Matters at vascular and vein specialist. She was discovered to have the common iliac vein measuring 12.2 mm but at the level of L4-L5 measured 3 mm. After stenting it measured 10 mm. 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 Rouleaux flow in the common femoral and femoral vein was observed much improved after stenting. We are using silver alginate to the wounds on the medial and lateral ankle on the left. 4 layer compression 10/7; the patient had fluid swelling around her knee and 4 layer compression. At the advice of vein and vascular this was reduced to 3 layer which she is tolerating better. We have been using silver alginate under 3 layer compression since last Friday Objective Constitutional Patient is hypertensive.. Pulse regular and within target range for patient.Marland Kitchen Respirations regular, non-labored and within target range.. Temperature is normal and within the target range for the patient.Marland Kitchen appears in no distress. Vitals Time Taken: 12:48 PM, Height: 63 in, Weight: 224.7 lbs, BMI: 39.8, Temperature: 99.2 F, Pulse: 86 bpm, Respiratory Rate: 16 breaths/min, Blood Pressure: 154/74 mmHg. General Notes: Wound exam; the 2 original wounds on  the left medial and subsequently the left lateral ankle area both require debridement with an open curette hemostasis with direct pressure. Around the lateral wound the superficial areas seem better to me there is still erythema in this area that I think is venous stasis. I did not see any evidence of cellulitis Integumentary (Hair, Skin) Wound #5 status is Open. Original cause of wound was Gradually Appeared. The wound is located on the Left,Medial Lower Leg. The wound measures 2.9cm length x 2.9cm width x 0.1cm depth; 6.605cm^2 area and 0.661cm^3 volume. There is Fat Layer (Subcutaneous Tissue) Exposed exposed. There is no tunneling  or undermining noted. There is a medium amount of serous drainage noted. The wound margin is indistinct and nonvisible. There is small (1-33%) pink, friable granulation within the wound bed. There is a large (67-100%) amount of necrotic tissue within the wound bed including Adherent Slough. Wound #6 status is Open. Original cause of wound was Gradually Appeared. The wound is located on the Left,Lateral Lower Leg. The wound measures 10.2cm length x 7cm width x 0.2cm depth; 56.077cm^2 area and 11.215cm^3 volume. There is Fat Layer (Subcutaneous Tissue) Exposed exposed. There is no tunneling or undermining noted. There is a large amount of serous drainage noted. The wound margin is indistinct and nonvisible. There is small (1-33%) pink granulation within the wound bed. There is a large (67-100%) amount of necrotic tissue within the wound bed including Adherent Slough. SHEALIN, ELIJAH ([id number]) Wound #9 status is Open. Original cause of wound was Gradually Appeared. The wound is located on the Left,Lateral,Posterior Lower Leg. The wound measures 4cm length x 3.5cm width x 0.1cm depth; 10.996cm^2 area and 1.1cm^3 volume. There is Fat Layer (Subcutaneous Tissue) Exposed exposed. There is no tunneling or undermining noted. There is a large amount of serous drainage noted.  The wound margin is indistinct and nonvisible. There is medium (34-66%) pink granulation within the wound bed. There is a medium (34-66%) amount of necrotic tissue within the wound bed including Adherent Slough. Assessment Active Problems ICD-10 Non-pressure chronic ulcer of left calf limited to breakdown of skin Chronic venous hypertension (idiopathic) with inflammation of right lower extremity Lymphedema, not elsewhere classified Procedures Wound #5 Pre-procedure diagnosis of Wound #5 is a Lymphedema located on the Left,Medial Lower Leg . There was a Excisional Skin/Subcutaneous Tissue Debridement with a total area of 8.41 sq cm performed by [name] Dillon, MD. With the following instrument(s): Curette to remove Viable tissue/material. Material removed includes Subcutaneous Tissue and Slough and after achieving pain control using Lidocaine. No specimens were taken. A time out was conducted at 13:09, prior to the start of the procedure. A Moderate amount of bleeding was controlled with Pressure. The procedure was tolerated well. Post Debridement Measurements: 2.9cm length x 2.9cm width x 0.2cm depth; 1.321cm^3 volume. Character of Wound/Ulcer Post Debridement is stable. Post procedure Diagnosis Wound #5: Same as Pre-Procedure Wound #9 Pre-procedure diagnosis of Wound #9 is a Venous Leg Ulcer located on the Left,Lateral,Posterior Lower Leg .Severity of Tissue Pre Debridement is: Fat layer exposed. There was a Excisional Skin/Subcutaneous Tissue Debridement with a total area of 14 sq cm performed by [name] Dillon, MD. With the following instrument(s): Curette to remove Viable tissue/material. Material removed includes Subcutaneous Tissue and Slough and after achieving pain control using Lidocaine. No specimens were taken. A time out was conducted at 13:09, prior to the start of the procedure. A Moderate amount of bleeding was controlled with Pressure. The procedure was tolerated well.  Post Debridement Measurements: 4cm length x 3.5cm width x 0.2cm depth; 2.199cm^3 volume. Character of Wound/Ulcer Post Debridement is stable. Severity of Tissue Post Debridement is: Fat layer exposed. Post procedure Diagnosis Wound #9: Same as Pre-Procedure Plan Wound Cleansing: Wound #5 Left,Medial Lower Leg: Cleanse wound with mild soap and water DEJAHNAE, HOLSTER ([id number]) Wound #6 Left,Lateral Lower Leg: Cleanse wound with mild soap and water Wound #9 Left,Lateral,Posterior Lower Leg: Cleanse wound with mild soap and water Anesthetic (add to Medication List): Wound #5 Left,Medial Lower Leg: Topical Lidocaine 4% cream applied to wound bed prior to debridement (In Clinic Only). Wound #6  Left,Lateral Lower Leg: Topical Lidocaine 4% cream applied to wound bed prior to debridement (In Clinic Only). Wound #9 Left,Lateral,Posterior Lower Leg: Topical Lidocaine 4% cream applied to wound bed prior to debridement (In Clinic Only). Skin Barriers/Peri-Wound Care: Wound #5 Left,Medial Lower Leg: Triamcinolone Acetonide Ointment (TCA) Wound #6 Left,Lateral Lower Leg: Triamcinolone Acetonide Ointment (TCA) Wound #9 Left,Lateral,Posterior Lower Leg: Triamcinolone Acetonide Ointment (TCA) Primary Wound Dressing: Wound #5 Left,Medial Lower Leg: Silver Alginate Wound #6 Left,Lateral Lower Leg: Silver Alginate Wound #9 Left,Lateral,Posterior Lower Leg: Silver Alginate Secondary Dressing: Wound #5 Left,Medial Lower Leg: Drawtex Wound #6 Left,Lateral Lower Leg: Drawtex Wound #9 Left,Lateral,Posterior Lower Leg: Drawtex Dressing Change Frequency: Wound #5 Left,Medial Lower Leg: Change dressing every week Other: - as needed. Wound #6 Left,Lateral Lower Leg: Change dressing every week Other: - as needed. Wound #9 Left,Lateral,Posterior Lower Leg: Change dressing every week Other: - as needed. Follow-up Appointments: Wound #5 Left,Medial Lower Leg: Return Appointment in 1 week. Nurse  Visit as needed - Call of needed Wound #6 Left,Lateral Lower Leg: Return Appointment in 1 week. Nurse Visit as needed - Call of needed Wound #9 Left,Lateral,Posterior Lower Leg: Return Appointment in 1 week. Nurse Visit as needed - Call of needed Edema Control: Wound #5 Left,Medial Lower Leg: 3 Layer Compression System - Left Lower Extremity - continue all other wound care orders Wound #6 Left,Lateral Lower Leg: 3 Layer Compression System - Left Lower Extremity - continue all other wound care orders Wound #9 Left,Lateral,Posterior Lower Leg: 3 Layer Compression System - Left Lower Extremity - continue all other wound care orders PNINA, ARMAO. ([id number]) 1. Continue silver alginate for another week. 2. Liberal TCA especially in the left lateral. 3. The surface of her wounds are not that healthy although I did do a reasonably aggressive debridement of both areas. The only debriding agent she is tolerated or agent that we could use as a debriding agent is Sorbact and I may go back to that. She did not tolerate Hydrofera Blue Electronic Signature(s) Signed: 08/12/2019 5:14:46 PM By: Linton Ham MD Entered By: Linton Ham on 08/12/2019 13:27:58 Gomillion, [name] ([id number]) -------------------------------------------------------------------------------- SuperBill Details Patient Name: DARBI, WOHLERS. Date of Service: 08/12/2019 Medical Record Number: [id number] Patient Account Number: [id number] Date of Birth/Sex: 1945-12-03 (73 y.o. F) Treating RN: Cornell Barman Primary Care Provider: [name] Other Clinician: Referring Provider: [name] Treating Provider/Extender: Tito Dine in Treatment: 35 Diagnosis Coding ICD-10 Codes Code Description [phone] Non-pressure chronic ulcer of left calf limited to breakdown of skin I87.321 Chronic venous hypertension (idiopathic) with inflammation of right lower extremity I89.0 Lymphedema, not elsewhere  classified Facility Procedures CPT4 Code: [id number] Description: [id number] - DEB SUBQ TISSUE 20 SQ CM/< ICD-10 Diagnosis Description L97.221 Non-pressure chronic ulcer of left calf limited to breakdown Modifier: of skin Quantity: 1 CPT4 Code: [id number] Description: [id number] - DEB SUBQ TISS EA ADDL 20CM ICD-10 Diagnosis Description L97.221 Non-pressure chronic ulcer of left calf limited to breakdown Modifier: of skin Quantity: 1 Physician Procedures CPT4 Code: [id number] Description: [id number] - WC PHYS SUBQ TISS 20 SQ CM ICD-10 Diagnosis Description L97.221 Non-pressure chronic ulcer of left calf limited to breakdown Modifier: of skin Quantity: 1 CPT4 Code: [id number] Description: [id number] - WC PHYS SUBQ TISS EA ADDL 20 CM ICD-10 Diagnosis Description L97.221 Non-pressure chronic ulcer of left calf limited to breakdown Modifier: of skin Quantity: 1 Electronic Signature(s) Signed: 08/12/2019 5:14:46 PM By: Linton Ham MD Entered By: Linton Ham on 08/12/2019 13:31:35

## 2019-08-12 NOTE — Progress Notes (Signed)
AIRANNA, HAINLINE ([id number]) Visit Report for 08/12/2019 Arrival Information Details Patient Name: [name], [name]. Date of Service: 08/12/2019 12:45 PM Medical Record Number: [id number] Patient Account Number: [id number] Date of Birth/Sex: May 19, 1946 (73 y.o. F) Treating RN: Montey Hora Primary Care Javoris Star: [name] Other Clinician: Referring Jamont Mellin: [name] Treating Mikhaela Zaugg/Extender: Tito Dine in Treatment: 63 Visit Information History Since Last Visit Added or deleted any medications: No Patient Arrived: Ambulatory Any new allergies or adverse reactions: No Arrival Time: 12:42 Had a fall or experienced change in No Accompanied By: self activities of daily living that may affect Transfer Assistance: None risk of falls: Patient Identification Verified: Yes Signs or symptoms of abuse/neglect since last visito No Secondary Verification Process Yes Hospitalized since last visit: No Completed: Implantable device outside of the clinic excluding No Patient Requires Transmission-Based No cellular tissue based products placed in the center Precautions: since last visit: Patient Has Alerts: Yes Has Dressing in Place as Prescribed: Yes Patient Alerts: Patient on Blood Has Compression in Place as Prescribed: Yes Thinner Pain Present Now: No aspirin 81 Electronic Signature(s) Signed: 08/12/2019 4:42:21 PM By: Montey Hora Entered By: Montey Hora on 08/12/2019 12:47:40 Stejskal, [name] -------------------------------------------------------------------------------- Encounter Discharge Information Details Patient Name: [name]. Date of Service: 08/12/2019 12:45 PM Medical Record Number: [id number] Patient Account Number: [id number] Date of Birth/Sex: 1946-02-25 (73 y.o. F) Treating RN: Cornell Barman Primary Care Lakaisha [name]: [name] Other Clinician: Referring Liliauna Santoni: [name] Treating Burnis Kaser/Extender:  Tito Dine in Treatment: 35 Encounter Discharge Information Items Post Procedure Vitals Discharge Condition: Stable Temperature (F): 99.2 Ambulatory Status: Ambulatory Pulse (bpm): 86 Discharge Destination: Home Respiratory Rate (breaths/min): 16 Transportation: Private Auto Blood Pressure (mmHg): 154/74 Accompanied By: self Schedule Follow-up Appointment: Yes Clinical Summary of Care: Electronic Signature(s) Signed: 08/12/2019 5:19:46 PM By: [name], [name], RN, CWS, [name], [name] Entered By: [name], [name], RN, CWS, Kim on 08/12/2019 13:20:01 [name] -------------------------------------------------------------------------------- Lower Extremity Assessment Details Patient Name: [name]. Date of Service: 08/12/2019 12:45 PM Medical Record Number: [id number] Patient Account Number: [id number] Date of Birth/Sex: Dec 03, 1945 (73 y.o. F) Treating RN: Montey Hora Primary Care Tekeya Geffert: [name] Other Clinician: Referring Sander Speckman: [name] Treating Julicia Krieger/Extender: Tito Dine in Treatment: 35 Edema Assessment Assessed: [Left: No] [Right: No] Edema: [Left: Ye] [Right: s] Calf Left: Right: Point of Measurement: 31 cm From Medial Instep 43.5 cm cm Ankle Left: Right: Point of Measurement: 12 cm From Medial Instep 23.5 cm cm Vascular Assessment Pulses: Dorsalis Pedis Palpable: [Left:Yes] Electronic Signature(s) Signed: 08/12/2019 4:42:21 PM By: Montey Hora Entered By: Montey Hora on 08/12/2019 12:50:18 Chain, [name] -------------------------------------------------------------------------------- Multi Wound Chart Details Patient Name: [name]. Date of Service: 08/12/2019 12:45 PM Medical Record Number: [id number] Patient Account Number: [id number] Date of Birth/Sex: [birth year] (73 y.o. F) Treating RN: Cornell Barman Primary Care Kamarah Bilotta: [name] Other Clinician: Referring  Novia Lansberry: [name] Treating Loreda Silverio/Extender: Tito Dine in Treatment: 35 Vital Signs Height(in): 63 Pulse(bpm): 72 Weight(lbs): 224.7 Blood Pressure(mmHg): 154/74 Body Mass Index(BMI): 40 Temperature(F): 99.2 Respiratory Rate 16 (breaths/min): Photos: Wound Location: Left Lower Leg - Medial Left Lower Leg - Lateral Left Lower Leg - Lateral, Posterior Wounding Event: Gradually Appeared Gradually Appeared Gradually Appeared Primary Etiology: Lymphedema Venous Leg Ulcer Venous Leg Ulcer Comorbid History: Cataracts, Asthma, Sleep Cataracts, Asthma, Sleep Cataracts, Asthma, Sleep Apnea, Deep Vein Apnea, Deep Vein Apnea, Deep Vein Thrombosis, Hypertension, Thrombosis, Hypertension, Thrombosis, Hypertension, Peripheral Venous Disease, Peripheral  Venous Disease, Peripheral Venous Disease, Osteoarthritis, Received Osteoarthritis, Received Osteoarthritis, Received Chemotherapy, Received Chemotherapy, Received Chemotherapy, Received Radiation Radiation Radiation Date Acquired: 11/19/2018 01/19/2019 07/08/2019 Weeks of Treatment: 35 29 5 Wound Status: Open Open Open Measurements L x W x D 2.9x2.9x0.1 10.2x7x0.2 4x3.5x0.1 (cm) Area (cm) : 6.605 56.077 10.996 Volume (cm) : 0.661 11.215 1.1 % Reduction in Area: 22.70% -25389.50% -1846.20% % Reduction in Volume: 22.70% -50877.30% -1829.80% Classification: Full Thickness Without Full Thickness Without Full Thickness Without Exposed Support Structures Exposed Support Structures Exposed Support Structures Exudate Amount: Medium Large Large Exudate Type: Serous Serous Serous Exudate Color: Geophysical data processor Wound Margin: Indistinct, nonvisible Indistinct, nonvisible Indistinct, nonvisible Granulation Amount: Small (1-33%) Small (1-33%) Medium (34-66%) Granulation Quality: Pink, Friable Pink Pink Necrotic Amount: Large (67-100%) Large (67-100%) Medium (34-66%) [name], [name] ([id number]) Exposed Structures: Fat Layer  (Subcutaneous Fat Layer (Subcutaneous Fat Layer (Subcutaneous Tissue) Exposed: Yes Tissue) Exposed: Yes Tissue) Exposed: Yes Fascia: No Fascia: No Fascia: No Tendon: No Tendon: No Tendon: No Muscle: No Muscle: No Muscle: No Joint: No Joint: No Joint: No Bone: No Bone: No Bone: No Epithelialization: None Small (1-33%) Medium (34-66%) Debridement: Debridement - Excisional N/A Debridement - Excisional Pre-procedure 13:09 N/A 13:09 Verification/Time Out Taken: Pain Control: Lidocaine N/A Lidocaine Tissue Debrided: Subcutaneous, Slough N/A Subcutaneous, Slough Level: Skin/Subcutaneous Tissue N/A Skin/Subcutaneous Tissue Debridement Area (sq cm): 8.41 N/A 14 Instrument: Curette N/A Curette Bleeding: Moderate N/A Moderate Hemostasis Achieved: Pressure N/A Pressure Debridement Treatment Procedure was tolerated well N/A Procedure was tolerated well Response: Post Debridement 2.9x2.9x0.2 N/A 4x3.5x0.2 Measurements L x W x D (cm) Post Debridement Volume: 1.321 N/A 2.199 (cm) Procedures Performed: Debridement N/A Debridement Treatment Notes Wound #5 (Left, Medial Lower Leg) Notes Zinc, silver cell, drawtex, ABD 3 layer Wound #6 (Left, Lateral Lower Leg) Notes Zinc, silver cell, drawtex, ABD 3 layer Wound #9 (Left, Lateral, Posterior Lower Leg) Notes Zinc, silver cell, drawtex, ABD 3 layer Electronic Signature(s) Signed: 08/12/2019 5:14:46 PM By: Linton Ham MD Entered By: Linton Ham on 08/12/2019 13:24:20 [name] -------------------------------------------------------------------------------- Multi-Disciplinary Care Plan Details Patient Name: ANABELLA, YEATS. Date of Service: 08/12/2019 12:45 PM Medical Record Number: [id number] Patient Account Number: [id number] Date of Birth/Sex: 12-01-1945 (73 y.o. F) Treating RN: Cornell Barman Primary Care Windsor Zirkelbach: [name] Other Clinician: Referring Shahram Alexopoulos: [name] Treating  Dearia Wilmouth/Extender: Tito Dine in Treatment: 49 Active Inactive Orientation to the Wound Care Program Nursing Diagnoses: Knowledge deficit related to the wound healing center program Goals: Patient/caregiver will verbalize understanding of the [facility] Date Initiated: 12/10/2018 Target Resolution Date: 01/09/2019 Goal Status: Active Interventions: Provide education on orientation to the wound center Notes: Soft Tissue Infection Nursing Diagnoses: Impaired tissue integrity Goals: Patient's soft tissue infection will resolve Date Initiated: 12/10/2018 Target Resolution Date: 01/09/2019 Goal Status: Active Interventions: Assess signs and symptoms of infection every visit Notes: Venous Leg Ulcer Nursing Diagnoses: Actual venous Insuffiency (use after diagnosis is confirmed) Goals: Patient will maintain optimal edema control Date Initiated: 12/10/2018 Target Resolution Date: 01/09/2019 Goal Status: Active Interventions: Assess peripheral edema status every visit. JAELYN, SIEDLECKI ([id number]) Treatment Activities: Therapeutic compression applied : 12/10/2018 Notes: Wound/Skin Impairment Nursing Diagnoses: Impaired tissue integrity Goals: Patient/caregiver will verbalize understanding of skin care regimen Date Initiated: 12/10/2018 Target Resolution Date: 01/09/2019 Goal Status: Active Interventions: Assess ulceration(s) every visit Treatment Activities: Topical wound management initiated : 12/10/2018 Notes: Electronic Signature(s) Signed: 08/12/2019 5:19:46 PM By: [name], [name], RN, CWS, [name], [name] Entered By: [name], [name],  RN, CWS, Kim on 08/12/2019 13:10:22 SHERLE, NIQUETTE ([id number]) -------------------------------------------------------------------------------- Pain Assessment Details Patient Name: CARSYN, SALM. Date of Service: 08/12/2019 12:45 PM Medical Record Number: [id number] Patient Account Number: [id number] Date of Birth/Sex: [birth year] (73  y.o. F) Treating RN: Montey Hora Primary Care Lannie Yusuf: [name] Other Clinician: Referring Eleonora Peeler: [name] Treating Kandis Henry/Extender: Tito Dine in Treatment: 35 Active Problems Location of Pain Severity and Description of Pain Patient Has Paino Yes Site Locations Pain Location: Pain in Ulcers With Dressing Change: Yes Duration of the Pain. Constant / Intermittento Intermittent Pain Management and Medication Current Pain Management: Electronic Signature(s) Signed: 08/12/2019 4:42:21 PM By: Montey Hora Entered By: Montey Hora on 08/12/2019 12:48:03 Jasmin, [name] -------------------------------------------------------------------------------- Patient/Caregiver Education Details Patient Name: YARITZI, HAMMER. Date of Service: 08/12/2019 12:45 PM Medical Record Number: [id number] Patient Account Number: [id number] Date of Birth/Gender: Dec 15, 1945 (73 y.o. F) Treating RN: Cornell Barman Primary Care Physician: [name] Other Clinician: Referring Physician: [name] Treating Physician/Extender: Tito Dine in Treatment: 86 Education Assessment Education Provided To: Patient Education Topics Provided Wound/Skin Impairment: Handouts: Caring for Your Ulcer Methods: Demonstration, Explain/Verbal Responses: State content correctly Electronic Signature(s) Signed: 08/12/2019 5:19:46 PM By: [name], [name], RN, CWS, [name], [name] Entered By: [name], [name], RN, CWS, Kim on 08/12/2019 13:16:29 [name] -------------------------------------------------------------------------------- Wound Assessment Details Patient Name: JAKAIYA, RIGGLES. Date of Service: 08/12/2019 12:45 PM Medical Record Number: [id number] Patient Account Number: [id number] Date of Birth/Sex: 10-[name]-1946 (73 y.o. F) Treating RN: Montey Hora Primary Care Sharbel Sahagun: [name] Other Clinician: Referring Kiandra Sanguinetti: [name] Treating Kirsi Hugh/Extender: Tito Dine in Treatment: 35 Wound Status Wound Number: 5 Primary Lymphedema Etiology: Wound Location: Left Lower Leg - Medial Wound Open Wounding Event: Gradually Appeared Status: Date Acquired: 11/19/2018 Comorbid Cataracts, Asthma, Sleep Apnea, Deep Vein Weeks Of Treatment: 35 History: Thrombosis, Hypertension, Peripheral Venous Clustered Wound: No Disease, Osteoarthritis, Received Chemotherapy, Received Radiation Photos Wound Measurements Length: (cm) 2.9 Width: (cm) 2.9 Depth: (cm) 0.1 Area: (cm) 6.605 Volume: (cm) 0.661 % Reduction in Area: 22.7% % Reduction in Volume: 22.7% Epithelialization: None Tunneling: No Undermining: No Wound Description Full Thickness Without Exposed Support Classification: Structures Wound Margin: Indistinct, nonvisible Exudate Medium Amount: Exudate Type: Serous Exudate Color: amber Foul Odor After Cleansing: No Slough/Fibrino Yes Wound Bed Granulation Amount: Small (1-33%) Exposed Structure Granulation Quality: Pink, Friable Fascia Exposed: No Necrotic Amount: Large (67-100%) Fat Layer (Subcutaneous Tissue) Exposed: Yes Necrotic Quality: Adherent Slough Tendon Exposed: No Muscle Exposed: No Joint Exposed: No Bone Exposed: No Eaglin, Collette J. ([id number]) Treatment Notes Wound #5 (Left, Medial Lower Leg) Notes Zinc, silver cell, drawtex, ABD 3 layer Electronic Signature(s) Signed: 08/12/2019 4:42:21 PM By: Montey Hora Entered By: Montey Hora on 08/12/2019 12:55:15 Lantzy, [name] -------------------------------------------------------------------------------- Wound Assessment Details Patient Name: CIENA, ERB. Date of Service: 08/12/2019 12:45 PM Medical Record Number: [id number] Patient Account Number: [id number] Date of Birth/Sex: [birth year] (73 y.o. F) Treating RN: Montey Hora Primary Care Bohden Dung: [name] Other Clinician: Referring  Rashanna Christiana: [name] Treating Tahje Borawski/Extender: Tito Dine in Treatment: 35 Wound Status Wound Number: 6 Primary Venous Leg Ulcer Etiology: Wound Location: Left Lower Leg - Lateral Wound Open Wounding Event: Gradually Appeared Status: Date Acquired: 01/19/2019 Comorbid Cataracts, Asthma, Sleep Apnea, Deep Vein Weeks Of Treatment: 29 History: Thrombosis, Hypertension, Peripheral Venous Clustered Wound: No Disease, Osteoarthritis, Received Chemotherapy, Received Radiation Photos Wound Measurements Length: (cm) 10.2 Width: (cm) 7 Depth: (cm) 0.2 Area: (cm)  56.077 Volume: (cm) 11.215 % Reduction in Area: -25389.5% % Reduction in Volume: -50877.3% Epithelialization: Small (1-33%) Tunneling: No Undermining: No Wound Description Full Thickness Without Exposed Support Classification: Structures Wound Margin: Indistinct, nonvisible Exudate Large Amount: Exudate Type: Serous Exudate Color: amber Foul Odor After Cleansing: No Slough/Fibrino Yes Wound Bed Granulation Amount: Small (1-33%) Exposed Structure Granulation Quality: Pink Fascia Exposed: No Necrotic Amount: Large (67-100%) Fat Layer (Subcutaneous Tissue) Exposed: Yes Necrotic Quality: Adherent Slough Tendon Exposed: No Muscle Exposed: No Joint Exposed: No Bone Exposed: No Broadfoot, Krystyn J. ([id number]) Treatment Notes Wound #6 (Left, Lateral Lower Leg) Notes Zinc, silver cell, drawtex, ABD 3 layer Electronic Signature(s) Signed: 08/12/2019 4:42:21 PM By: Montey Hora Entered By: Montey Hora on 08/12/2019 12:55:43 Lozito, [name] -------------------------------------------------------------------------------- Wound Assessment Details Patient Name: LAILA, JARZABEK. Date of Service: 08/12/2019 12:45 PM Medical Record Number: [id number] Patient Account Number: [id number] Date of Birth/Sex: 01-03-1946 (73 y.o. F) Treating RN: Montey Hora Primary Care Rukiya Hodgkins: [name] Other Clinician: Referring Aarionna Germer: [name] Treating Brilee Port/Extender: Tito Dine in Treatment: 35 Wound Status Wound Number: 9 Primary Venous Leg Ulcer Etiology: Wound Location: Left Lower Leg - Lateral, Posterior Wound Open Wounding Event: Gradually Appeared Status: Date Acquired: 07/08/2019 Comorbid Cataracts, Asthma, Sleep Apnea, Deep Vein Weeks Of Treatment: 5 History: Thrombosis, Hypertension, Peripheral Venous Clustered Wound: No Disease, Osteoarthritis, Received Chemotherapy, Received Radiation Photos Wound Measurements Length: (cm) 4 Width: (cm) 3.5 Depth: (cm) 0.1 Area: (cm) 10.996 Volume: (cm) 1.1 % Reduction in Area: -1846.2% % Reduction in Volume: -1829.8% Epithelialization: Medium (34-66%) Tunneling: No Undermining: No Wound Description Full Thickness Without Exposed Support Classification: Structures Wound Margin: Indistinct, nonvisible Exudate Large Amount: Exudate Type: Serous Exudate Color: amber Foul Odor After Cleansing: No Slough/Fibrino Yes Wound Bed Granulation Amount: Medium (34-66%) Exposed Structure Granulation Quality: Pink Fascia Exposed: No Necrotic Amount: Medium (34-66%) Fat Layer (Subcutaneous Tissue) Exposed: Yes Necrotic Quality: Adherent Slough Tendon Exposed: No Muscle Exposed: No Joint Exposed: No Bone Exposed: No Spalla, Lavette J. ([id number]) Treatment Notes Wound #9 (Left, Lateral, Posterior Lower Leg) Notes Zinc, silver cell, drawtex, ABD 3 layer Electronic Signature(s) Signed: 08/12/2019 4:42:21 PM By: Montey Hora Entered By: Montey Hora on 08/12/2019 12:56:10 Horger, [name] -------------------------------------------------------------------------------- Vitals Details Patient Name: XIANNA, LUPE. Date of Service: 08/12/2019 12:45 PM Medical Record Number: [id number] Patient Account Number: [id number] Date of Birth/Sex: December 18, 1945 (73 y.o. F) Treating RN:  Montey Hora Primary Care Khambrel Amsden: [name] Other Clinician: Referring Loreto Loescher: [name] Treating Kareem Cathey/Extender: Tito Dine in Treatment: 35 Vital Signs Time Taken: 12:48 Temperature (F): 99.2 Height (in): 63 Pulse (bpm): 86 Weight (lbs): 224.7 Respiratory Rate (breaths/min): 16 Body Mass Index (BMI): 39.8 Blood Pressure (mmHg): 154/74 Reference Range: 80 - 120 mg / dl Electronic Signature(s) Signed: 08/12/2019 4:42:21 PM By: Montey Hora Entered By: Montey Hora on 08/12/2019 12:48:26

## 2019-08-14 ENCOUNTER — Other Ambulatory Visit: Payer: Self-pay

## 2019-08-14 DIAGNOSIS — L97221 Non-pressure chronic ulcer of left calf limited to breakdown of skin: Secondary | ICD-10-CM | POA: Diagnosis not present

## 2019-08-14 DIAGNOSIS — I890 Lymphedema, not elsewhere classified: Secondary | ICD-10-CM | POA: Diagnosis not present

## 2019-08-19 ENCOUNTER — Other Ambulatory Visit: Payer: Self-pay

## 2019-08-19 ENCOUNTER — Encounter: Payer: Medicare Other | Admitting: Internal Medicine

## 2019-08-19 DIAGNOSIS — I87312 Chronic venous hypertension (idiopathic) with ulcer of left lower extremity: Secondary | ICD-10-CM | POA: Diagnosis not present

## 2019-08-19 DIAGNOSIS — L97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d: Secondary | ICD-10-CM | POA: Diagnosis not present

## 2019-08-19 DIAGNOSIS — I890 Lymphedema, not elsewhere classified: Secondary | ICD-10-CM | POA: Diagnosis not present

## 2019-08-19 DIAGNOSIS — L97221 Non-pressure chronic ulcer of left calf limited to breakdown of skin: Secondary | ICD-10-CM | POA: Diagnosis not present

## 2019-08-19 DIAGNOSIS — L97222 Non-pressure chronic ulcer of left calf with fat layer exposed: Secondary | ICD-10-CM | POA: Diagnosis not present

## 2019-08-20 NOTE — Progress Notes (Signed)
JAMEKIA, CISSE ([id number]) Visit Report for 08/19/2019 Arrival Information Details Patient Name: [name], [name]. Date of Service: 08/19/2019 12:45 PM Medical Record Number: [id number] Patient Account Number: [id number] Date of Birth/Sex: 11/15/1945 (73 y.o. F) Treating RN: Cornell Barman Primary Care Faatimah Spielberg: [name] Other Clinician: Referring Lexey Fletes: [name] Treating Talon Witting/Extender: Tito Dine in Treatment: 76 Visit Information History Since Last Visit Added or deleted any medications: No Patient Arrived: Ambulatory Any new allergies or adverse reactions: No Arrival Time: 12:40 Had a fall or experienced change in No Accompanied By: self activities of daily living that may affect Transfer Assistance: None risk of falls: Patient Identification Verified: Yes Signs or symptoms of abuse/neglect since last visito No Secondary Verification Process Yes Hospitalized since last visit: No Completed: Implantable device outside of the clinic excluding No Patient Requires Transmission-Based No cellular tissue based products placed in the center Precautions: since last visit: Patient Has Alerts: Yes Has Dressing in Place as Prescribed: Yes Patient Alerts: Patient on Blood Has Compression in Place as Prescribed: Yes Thinner Pain Present Now: No aspirin 81 Electronic Signature(s) Signed: 08/19/2019 4:52:13 PM By: Lorine Bears RCP, RRT, CHT Entered By: Lorine Bears on 08/19/2019 12:41:07 [name] ([id number]) -------------------------------------------------------------------------------- Encounter Discharge Information Details Patient Name: [name], [name]. Date of Service: 08/19/2019 12:45 PM Medical Record Number: [id number] Patient Account Number: [id number] Date of Birth/Sex: [birth year] (73 y.o. F) Treating RN: Montey Hora Primary Care Stefan Markarian: [name] Other Clinician: Referring Nazareth Kirk:  [name] Treating Arnetta Odeh/Extender: Tito Dine in Treatment: 32 Encounter Discharge Information Items Discharge Condition: Stable Ambulatory Status: Ambulatory Discharge Destination: Home Transportation: Private Auto Accompanied By: self Schedule Follow-up Appointment: Yes Clinical Summary of Care: Electronic Signature(s) Signed: 08/19/2019 2:25:07 PM By: Montey Hora Entered By: Montey Hora on 08/19/2019 14:25:06 Dapper, [name] ([id number]) -------------------------------------------------------------------------------- Lower Extremity Assessment Details Patient Name: [name], WINNS. Date of Service: 08/19/2019 12:45 PM Medical Record Number: [id number] Patient Account Number: [id number] Date of Birth/Sex: 1946/08/09 (73 y.o. F) Treating RN: Montey Hora Primary Care Edda Orea: [name] Other Clinician: Referring Karlissa Aron: [name] Treating Damar Petit/Extender: Tito Dine in Treatment: 36 Edema Assessment Assessed: [Left: No] [Right: No] Edema: [Left: Yes] [Right: Yes] Calf Left: Right: Point of Measurement: 31 cm From Medial Instep 44 cm cm Ankle Left: Right: Point of Measurement: 12 cm From Medial Instep 22.5 cm cm Vascular Assessment Pulses: Dorsalis Pedis Palpable: [Left:Yes] [Right:Yes] Electronic Signature(s) Signed: 08/19/2019 4:44:26 PM By: Montey Hora Entered By: Montey Hora on 08/19/2019 12:52:27 Bogart, [name] ([id number]) -------------------------------------------------------------------------------- Multi Wound Chart Details Patient Name: [name], [name]. Date of Service: 08/19/2019 12:45 PM Medical Record Number: [id number] Patient Account Number: [id number] Date of Birth/Sex: 04/22/1946 (73 y.o. F) Treating RN: Montey Hora Primary Care Jameir Ake: [name] Other Clinician: Referring Henny Strauch: [name] Treating Redell Bhandari/Extender: Tito Dine in Treatment:  36 Vital Signs Height(in): 63 Pulse(bpm): 70 Weight(lbs): 224.7 Blood Pressure(mmHg): 148/70 Body Mass Index(BMI): 40 Temperature(F): 98.5 Respiratory Rate 16 (breaths/min): Photos: Wound Location: Left Lower Leg - Medial Left Lower Leg - Lateral Left Lower Leg - Lateral, Posterior Wounding Event: Gradually Appeared Gradually Appeared Gradually Appeared Primary Etiology: Lymphedema Venous Leg Ulcer Venous Leg Ulcer Comorbid History: Cataracts, Asthma, Sleep Cataracts, Asthma, Sleep Cataracts, Asthma, Sleep Apnea, Deep Vein Apnea, Deep Vein Apnea, Deep Vein Thrombosis, Hypertension, Thrombosis, Hypertension, Thrombosis, Hypertension, Peripheral Venous Disease, Peripheral Venous Disease, Peripheral Venous Disease, Osteoarthritis, Received Osteoarthritis, Received Osteoarthritis, Received Chemotherapy, Received Chemotherapy, Received Chemotherapy, Received Radiation Radiation  Radiation Date Acquired: 11/19/2018 01/19/2019 07/08/2019 Weeks of Treatment: 36 30 6 Wound Status: Open Open Open Measurements L x W x D 2.7x2.7x0.1 3x3.2x0.1 5.5x4x0.1 (cm) Area (cm) : 5.726 7.54 17.279 Volume (cm) : 0.573 0.754 1.728 % Reduction in Area: 33.00% -3327.30% -2958.20% % Reduction in Volume: 33.00% -3327.30% -2931.60% Classification: Full Thickness Without Full Thickness Without Full Thickness Without Exposed Support Structures Exposed Support Structures Exposed Support Structures Exudate Amount: Large Large Large Exudate Type: Serosanguineous Serosanguineous Serosanguineous Exudate Color: red, brown red, brown red, brown Wound Margin: Flat and Intact Flat and Intact Flat and Intact Granulation Amount: Small (1-33%) Small (1-33%) Small (1-33%) Granulation Quality: Pale Pink Pink Necrotic Amount: Large (67-100%) Large (67-100%) Large (67-100%) Pensinger, Lean J. ([id number]) Exposed Structures: Fat Layer (Subcutaneous Fat Layer (Subcutaneous Fat Layer (Subcutaneous Tissue) Exposed: Yes Tissue)  Exposed: Yes Tissue) Exposed: Yes Fascia: No Fascia: No Fascia: No Tendon: No Tendon: No Tendon: No Muscle: No Muscle: No Muscle: No Joint: No Joint: No Joint: No Bone: No Bone: No Bone: No Epithelialization: None None None Treatment Notes Electronic Signature(s) Signed: 08/19/2019 5:10:21 PM By: Linton Ham MD Entered By: Linton Ham on 08/19/2019 13:31:22 [name] -------------------------------------------------------------------------------- Multi-Disciplinary Care Plan Details Patient Name: [name], [name]. Date of Service: 08/19/2019 12:45 PM Medical Record Number: [id number] Patient Account Number: [id number] Date of Birth/Sex: 1946/03/02 (73 y.o. F) Treating RN: Montey Hora Primary Care Bekki Tavenner: [name] Other Clinician: Referring Aurorah Schlachter: [name] Treating Gabriellia Rempel/Extender: Tito Dine in Treatment: 31 Active Inactive Orientation to the Wound Care Program Nursing Diagnoses: Knowledge deficit related to the wound healing center program Goals: Patient/caregiver will verbalize understanding of the [facility] Date Initiated: 12/10/2018 Target Resolution Date: 01/09/2019 Goal Status: Active Interventions: Provide education on orientation to the wound center Notes: Soft Tissue Infection Nursing Diagnoses: Impaired tissue integrity Goals: Patient's soft tissue infection will resolve Date Initiated: 12/10/2018 Target Resolution Date: 01/09/2019 Goal Status: Active Interventions: Assess signs and symptoms of infection every visit Notes: Venous Leg Ulcer Nursing Diagnoses: Actual venous Insuffiency (use after diagnosis is confirmed) Goals: Patient will maintain optimal edema control Date Initiated: 12/10/2018 Target Resolution Date: 01/09/2019 Goal Status: Active Interventions: Assess peripheral edema status every visit. EVERLEA, BODDICKER ([id number]) Treatment Activities: Therapeutic  compression applied : 12/10/2018 Notes: Wound/Skin Impairment Nursing Diagnoses: Impaired tissue integrity Goals: Patient/caregiver will verbalize understanding of skin care regimen Date Initiated: 12/10/2018 Target Resolution Date: 01/09/2019 Goal Status: Active Interventions: Assess ulceration(s) every visit Treatment Activities: Topical wound management initiated : 12/10/2018 Notes: Electronic Signature(s) Signed: 08/19/2019 4:44:26 PM By: Montey Hora Entered By: Montey Hora on 08/19/2019 13:10:07 [name] -------------------------------------------------------------------------------- Pain Assessment Details Patient Name: FREDONIA, DESHAIES. Date of Service: 08/19/2019 12:45 PM Medical Record Number: [id number] Patient Account Number: [id number] Date of Birth/Sex: 08/26/1946 (73 y.o. F) Treating RN: Cornell Barman Primary Care Hobert Poplaski: [name] Other Clinician: Referring Ishmel Acevedo: [name] Treating Jovonni Borquez/Extender: Tito Dine in Treatment: 36 Active Problems Location of Pain Severity and Description of Pain Patient Has Paino No Site Locations Pain Management and Medication Current Pain Management: Electronic Signature(s) Signed: 08/19/2019 1:48:26 PM By: [name], [name], RN, CWS, [name], [name] Signed: 08/19/2019 4:52:13 PM By: Lorine Bears RCP, RRT, CHT Entered By: Lorine Bears on 08/19/2019 12:41:14 [name] -------------------------------------------------------------------------------- Patient/Caregiver Education Details Patient Name: SHELLIA, KUBINA. Date of Service: 08/19/2019 12:45 PM Medical Record Number: [id number] Patient Account Number: [id number] Date of Birth/Gender: 1946/08/12 (73 y.o. F) Treating RN:  Montey Hora Primary Care Physician: [name] Other Clinician: Referring Physician: [name] Treating Physician/Extender: Tito Dine  in Treatment: 69 Education Assessment Education Provided To: Patient Education Topics Provided Venous: Handouts: Controlling Swelling with Multilayered Compression Wraps Methods: Demonstration, Explain/Verbal Responses: State content correctly Electronic Signature(s) Signed: 08/19/2019 4:44:26 PM By: Montey Hora Entered By: Montey Hora on 08/19/2019 13:13:19 Peart, [name] -------------------------------------------------------------------------------- Wound Assessment Details Patient Name: [name], [name]. Date of Service: 08/19/2019 12:45 PM Medical Record Number: [id number] Patient Account Number: [id number] Date of Birth/Sex: May 21, 1946 (73 y.o. F) Treating RN: Montey Hora Primary Care Marcelis Wissner: [name] Other Clinician: Referring Azula Zappia: [name] Treating Julieanna Geraci/Extender: Tito Dine in Treatment: 36 Wound Status Wound Number: 5 Primary Lymphedema Etiology: Wound Location: Left Lower Leg - Medial Wound Open Wounding Event: Gradually Appeared Status: Date Acquired: 11/19/2018 Comorbid Cataracts, Asthma, Sleep Apnea, Deep Vein Weeks Of Treatment: 36 History: Thrombosis, Hypertension, Peripheral Venous Clustered Wound: No Disease, Osteoarthritis, Received Chemotherapy, Received Radiation Photos Wound Measurements Length: (cm) 2.7 Width: (cm) 2.7 Depth: (cm) 0.1 Area: (cm) 5.726 Volume: (cm) 0.573 % Reduction in Area: 33% % Reduction in Volume: 33% Epithelialization: None Tunneling: No Undermining: No Wound Description Full Thickness Without Exposed Support Classification: Structures Wound Margin: Flat and Intact Exudate Large Amount: Exudate Type: Serosanguineous Exudate Color: red, brown Foul Odor After Cleansing: No Slough/Fibrino Yes Wound Bed Granulation Amount: Small (1-33%) Exposed Structure Granulation Quality: Pale Fascia Exposed: No Necrotic Amount: Large (67-100%) Fat Layer  (Subcutaneous Tissue) Exposed: Yes Necrotic Quality: Adherent Slough Tendon Exposed: No Muscle Exposed: No Joint Exposed: No Bone Exposed: No Sunderland, Anjolie J. ([id number]) Treatment Notes Wound #5 (Left, Medial Lower Leg) Notes TCA, silvercel, drawtex, abd, 3 layer wrap with unna to anchor Electronic Signature(s) Signed: 08/19/2019 4:44:26 PM By: Montey Hora Entered By: Montey Hora on 08/19/2019 12:56:25 Mcquilkin, [name] -------------------------------------------------------------------------------- Wound Assessment Details Patient Name: CYTLALI, [name]. Date of Service: 08/19/2019 12:45 PM Medical Record Number: [id number] Patient Account Number: [id number] Date of Birth/Sex: Nov 09, 1945 (73 y.o. F) Treating RN: Montey Hora Primary Care Brace Welte: [name] Other Clinician: Referring Rudell Ortman: [name] Treating Cleofas Hudgins/Extender: Tito Dine in Treatment: 36 Wound Status Wound Number: 6 Primary Venous Leg Ulcer Etiology: Wound Location: Left Lower Leg - Lateral Wound Open Wounding Event: Gradually Appeared Status: Date Acquired: 01/19/2019 Comorbid Cataracts, Asthma, Sleep Apnea, Deep Vein Weeks Of Treatment: 30 History: Thrombosis, Hypertension, Peripheral Venous Clustered Wound: No Disease, Osteoarthritis, Received Chemotherapy, Received Radiation Photos Wound Measurements Length: (cm) 3 Width: (cm) 3.2 Depth: (cm) 0.1 Area: (cm) 7.54 Volume: (cm) 0.754 % Reduction in Area: -3327.3% % Reduction in Volume: -3327.3% Epithelialization: None Tunneling: No Undermining: No Wound Description Full Thickness Without Exposed Support Classification: Structures Wound Margin: Flat and Intact Exudate Large Amount: Exudate Type: Serosanguineous Exudate Color: red, brown Foul Odor After Cleansing: No Slough/Fibrino Yes Wound Bed Granulation Amount: Small (1-33%) Exposed Structure Granulation Quality: Pink Fascia  Exposed: No Necrotic Amount: Large (67-100%) Fat Layer (Subcutaneous Tissue) Exposed: Yes Necrotic Quality: Adherent Slough Tendon Exposed: No Muscle Exposed: No Joint Exposed: No Bone Exposed: No [name], [name] ([id number]) Treatment Notes Wound #6 (Left, Lateral Lower Leg) Notes TCA, silvercel, drawtex, abd, 3 layer wrap with unna to anchor Electronic Signature(s) Signed: 08/19/2019 4:44:26 PM By: Montey Hora Entered By: Montey Hora on 08/19/2019 12:57:06 Sangalang, [name] -------------------------------------------------------------------------------- Wound Assessment Details Patient Name: [name], [name]. Date of Service: 08/19/2019 12:45 PM Medical Record Number: [id number] Patient Account Number: [id number]  Date of Birth/Sex: 08/23/1946 (73 y.o. F) Treating RN: Montey Hora Primary Care Breyana Follansbee: [name] Other Clinician: Referring Merelyn Klump: [name] Treating Gadiel John/Extender: Tito Dine in Treatment: 36 Wound Status Wound Number: 9 Primary Venous Leg Ulcer Etiology: Wound Location: Left Lower Leg - Lateral, Posterior Wound Open Wounding Event: Gradually Appeared Status: Date Acquired: 07/08/2019 Comorbid Cataracts, Asthma, Sleep Apnea, Deep Vein Weeks Of Treatment: 6 History: Thrombosis, Hypertension, Peripheral Venous Clustered Wound: No Disease, Osteoarthritis, Received Chemotherapy, Received Radiation Photos Wound Measurements Length: (cm) 5.5 Width: (cm) 4 Depth: (cm) 0.1 Area: (cm) 17.279 Volume: (cm) 1.728 % Reduction in Area: -2958.2% % Reduction in Volume: -2931.6% Epithelialization: None Tunneling: No Undermining: No Wound Description Full Thickness Without Exposed Support Classification: Structures Wound Margin: Flat and Intact Exudate Large Amount: Exudate Type: Serosanguineous Exudate Color: red, brown Foul Odor After Cleansing: No Slough/Fibrino Yes Wound Bed Granulation Amount:  Small (1-33%) Exposed Structure Granulation Quality: Pink Fascia Exposed: No Necrotic Amount: Large (67-100%) Fat Layer (Subcutaneous Tissue) Exposed: Yes Necrotic Quality: Adherent Slough Tendon Exposed: No Muscle Exposed: No Joint Exposed: No Bone Exposed: No Hailu, [name] ([id number]) Treatment Notes Wound #9 (Left, Lateral, Posterior Lower Leg) Notes TCA, silvercel, drawtex, abd, 3 layer wrap with unna to anchor Electronic Signature(s) Signed: 08/19/2019 4:44:26 PM By: Montey Hora Entered By: Montey Hora on 08/19/2019 12:58:04 [name] -------------------------------------------------------------------------------- Vitals Details Patient Name: [name], [name]. Date of Service: 08/19/2019 12:45 PM Medical Record Number: [id number] Patient Account Number: [id number] Date of Birth/Sex: 15-Oct-1946 (73 y.o. F) Treating RN: Cornell Barman Primary Care Cruise Baumgardner: [name] Other Clinician: Referring Larkyn Greenberger: [name] Treating Ismael Karge/Extender: Tito Dine in Treatment: 36 Vital Signs Time Taken: 12:41 Temperature (F): 98.5 Height (in): 63 Pulse (bpm): 81 Weight (lbs): 224.7 Respiratory Rate (breaths/min): 16 Body Mass Index (BMI): 39.8 Blood Pressure (mmHg): 148/70 Reference Range: 80 - 120 mg / dl Electronic Signature(s) Signed: 08/19/2019 4:52:13 PM By: Lorine Bears RCP, RRT, CHT Entered By: Lorine Bears on 08/19/2019 12:44:10

## 2019-08-20 NOTE — Progress Notes (Signed)
MATTHEW, DAGGETT ([id number]) Visit Report for 08/19/2019 HPI Details Patient Name: [name], [name]. Date of Service: 08/19/2019 12:45 PM Medical Record Number: [id number] Patient Account Number: [id number] Date of Birth/Sex: 1946/10/09 (73 y.o. F) Treating RN: Cornell Barman Primary Care Provider: [name] Other Clinician: Referring Provider: [name] Treating Provider/Extender: Tito Dine in Treatment: 19 History of Present Illness HPI Description: Pleasant 73 year old with history of chronic venous insufficiency. No diabetes or peripheral vascular disease. Left ABI 1.29. Questionable history of left lower extremity DVT. She developed a recurrent ulceration on her left lateral calf in December 2015, which she attributes to poor diet and subsequent lower extremity edema. She underwent endovenous laser ablation of her left greater saphenous vein in 2010. She underwent laser ablation of accessory branch of left GSV in April 2016 by Dr. Kellie Simmering at [facility]. She was previously wearing Unna boots, which she tolerated well. Tolerating 2 layer compression and cadexomer iodine. She returns to clinic for follow-up and is without new complaints. She denies any significant pain at this time. She reports persistent pain with pressure. No claudication or ischemic rest pain. No fever or chills. No drainage. READMISSION 11/13/16; this is a 73 year old woman who is not a diabetic. She is here for a review of a painful area on her left medial lower extremity. I note that she was seen here previously last year for wound I believe to be in the same area. At that time she had undergone previously a left greater saphenous vein ablation by Dr. Kellie Simmering and she had a ablation of the anterior accessory branch of the left greater saphenous vein in March 2016. Seeing that the wound actually closed over. In reviewing the history with her today the ulcer in this area has been recurrent. She  describes a biopsy of this area in 2009 that only showed stasis physiology. 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 Do not see that she has ever had arterial studies in the left leg. She tells me she has a follow-up with Dr. Kellie Simmering in roughly 10 days In any case she developed the reopening of this area roughly a month ago. On the background of this she describes rapidly increasing edema which has responded to Lasix 40 mg and metolazone 2.5 mg as well as the patient's lymph massage. She has been told she has both venous insufficiency and lymphedema but she cannot tolerate compression stockings 11/28/16; the patient saw Dr. Kellie Simmering recently. Per the patient he did arterial Dopplers in the office that did not show evidence of arterial insufficiency, per the patient he stated "treat this like an ordinary venous ulcer". She also saw her dermatologist Dr. Ronnald Ramp who felt that this was more of a vascular ulcer. In general things are improving although she arrives today with increasing bilateral lower extremity edema with weeping a deeper fluid through the wound on the left medial leg compatible with some degree of lymphedema 12/04/16; the patient's wound is fully epithelialized but I don't think fully healed. We will do another week of depression with Promogran and TCA however I suspect we'll be able to discharge her next week. This is a very unusual-looking wound which was initially a figure-of-eight type wound lying on its side surrounded by petechial like hemorrhage. She  has had venous ablation on this side. She apparently does not have an arterial issue per Dr. Kellie Simmering. She saw her dermatologist thought it was "vascular". Patient is definitely going to need ongoing compression and I  talked about this with her today she will go to elastic therapy after she leaves here next week 12/11/16; the patient's wound is not completely closed today. She has surrounding scar tissue and in further discussion with the patient it would appear that she had ulcers in this area in 2009 for a prolonged period of time ultimately requiring a punch biopsy of this area that only showed venous insufficiency. I did not previously pickup on this part of the history from the patient. 12/18/16; the patient's wound is completely epithelialized. There is no open area here. She has significant bilateral venous insufficiency with secondary lymphedema to a mild-to-moderate degree she does not have compression stockings.. She did not say anything to me when I was in the room, she told our intake nurse that she was still having pain in this area. This isn't [name], [name] ([id number]) unusual recurrent small open area. She is going to go to elastic therapy to obtain compression stockings. 12/25/16; the patient's wound is fully epithelialized. There is no open area here. The patient describes some continued episodic discomfort in this area medial left calf. However everything looks fine and healed here. She is been to elastic therapy and caught herself 15-20 mmHg stockings, they apparently were having trouble getting 20-30 mm stockings in her size 01/22/17; this is a patient we discharged from the clinic a month ago. She has a recurrent open wound on her medial left calf. She had 15 mm support stockings. I told her I thought she needed 20-30 mm compression stockings. She tells me that she has been ill with hospitalization secondary to asthma and is been found to have severe hypokalemia likely secondary to a combination of Lasix and metolazone. This morning she noted blistering and leaking fluid on the posterior part of her left leg. She called our intake nurse urgently and we was saw her this afternoon. She has not  had any real discomfort here. I don't know that she's been wearing any stockings on this leg for at least 2-3 days. ABIs in this clinic were 1.21 on the right and 1.3 on the left. She is previously seen vascular surgery who does not think that there is a peripheral arterial issue. 01/30/17; Patient arrives with no open wound on the left leg. She has been to elastic therapy and obtained 20-75mmhg below knee stockings and she has one on the right leg today. READMISSION 02/19/18; this Forgacs is a now 73 year old patient we've had in this clinic perhaps 3 times before. I had last looked at her from January 07 December 2016 with an area on the medial left leg. We discharged her on 12/25/16 however she had to be readmitted on 01/22/17 with a recurrence. I have in my notes that we discharged her on 20-30 mm stockings although she tells me she was only wearing support hose because she cannot get stockings on predominantly related to her cervical spine surgery/issues. She has had previous ablations done by vein and vascular in [place] including a great saphenous vein ablation on the left with an anterior accessory branch ablation I think both of these were in 2016. On one of the previous visit she had a biopsy noted 2009 that was negative. She is not felt to have an arterial issue. She is not  a diabetic. She does have a history of obstructive sleep apnea hypertension asthma as well as chronic venous insufficiency and lymphedema. On this occasion she noted 2 dry scaly patch on her left leg. She tried to put lotion on this it didn't really help. There were 2 open areas.the patient has been seeing her primary physician from 02/05/18 through 02/14/18. She had Unna boots applied. The superior wound now on the lateral left leg has closed but she's had one wound that remains open on the lateral left leg. This is not the same spot as we dealt with in 2018. ABIs in this clinic were 1.3 bilaterally 02/26/18; patient has a  small wound on the left lateral calf. Dimensions are down. She has chronic venous insufficiency and lymphedema. 03/05/18; small open area on the left lateral calf. Dimensions are down. Tightly adherent necrotic debris over the surface of the wound which was difficult to remove. Also the dressing [over collagen] stuck to the wound surface. This was removed with some difficulty as well. Change the primary dressing to Hydrofera Blue ready 03/12/18; small open area on the left lateral calf. Comes in with tightly adherent surface eschar as well as some adherent Hydrofera Blue. 03/19/18; open area on the left lateral calf. Again adherent surface eschar as well as some adherent Hydrofera Blue nonviable subcutaneous tissue. She complained of pain all week even with the reduction from 4-3 layer compression I put on last week. Also she had an increase in her ankle and calf measurements probably related to the same thing. 03/26/18; open area on the left lateral calf. A very small open area remains here. We used silver alginate starting last week as the Hydrofera Blue seem to stick to the wound bed. In using 4-layer compression 04/02/18; the open area in the left lateral calf at some adherent slough which I removed there is no open area here. We are able to transition her into her own compression stocking. Truthfully I think this is probably his support hose. However this does not maintain skin integrity will be limited. She cannot put over the toe compression stockings on because of neck problems hand problems etc. She is allergic to the lining layer of juxta lites. We might be forced to use extremitease stocking should this fail READMIT 11/24/2018 Patient is now a 73 year old woman who is not a diabetic. She has been in this clinic on at least 3 previous occasions largely with recurrent wounds on her left leg secondary to chronic venous insufficiency with secondary lymphedema. Her situation is complicated by  inability to get stockings on and an allergy to neoprene which is apparently a component and at least juxta lites and other stockings. As a result she really has not been wearing any stockings on her legs. She tells [place] that roughly 2 or 3 weeks ago she started noticing a stinging sensation just above her ankle on the left medial aspect. She has been diagnosed with pseudogout and she wondered whether this was what she was experiencing. She tried to dress this with something she bought at the store however subsequently it pulled skin off and now she has an open wound that is not improving. She has been using Vaseline gauze with a cover bandage. She saw her primary doctor last week who KARYNNA, HAWKINS ([id number]) put an Unna boot on her. ABIs in this clinic was 1.03 on the left 2/12; the area is on the left medial ankle. Odd-looking wound with what looks to be surface epithelialization  but a multitude of small petechial openings. This clearly not closed yet. We have been using silver alginate under 3 layer compression with TCA 2/19; the wound area did not look quite as good this week. Necrotic debris over the majority of the wound surface which required debridement. She continues to have a multitude of what looked to be small petechial openings. She reminds [place] that she had a biopsy on this initially during her first outbreak in 2015 in [place] [facility]. She expresses concern about this being a possible melanoma. 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 I assured her that this does not look anything like melanoma. Except for the petechial reaction it does look like a venous insufficiency area and she certainly has evidence of this on both sides 2/26; a difficult area on the left medial ankle. The patient clearly has chronic venous hypertension with some degree of lymphedema. The odd thing about the area is  the small petechial hemorrhages. I am not really sure how to explain this. This was present last time and this is not a compression injury. We have been using Hydrofera Blue which I changed to last week 3/4; still using Hydrofera Blue. Aggressive debridement today. She does not have known arterial issues. She has seen Dr. Kellie Simmering at [facility] vein and vascular and and has an ablation on the left. [Anterior accessory branch of the greater saphenous]. From what I remember they did not feel she had an arterial issue. The patient has had this area biopsied in 2009 at [facility] dermatology and by her recollection they said this was "stasis". She is also follow-up with dermatology locally who thought that this was more of a vascular issue 3/11; using Hydrofera Blue. Aggressive debridement today. She does not have an arterial issue. We are using 3 layer compression although we may need to go to 4. The patient has been in for multiple changes to her wrap since I last saw her a week ago. She says that the area was leaking. I do not have too much more information on what was found 01/19/19 on evaluation today patient was actually being seen for a nurse visit when unfortunately she had the area on her left lateral lower extremity as well as weeping from the right lower extremity that became apparent. Therefore we did end up actually seeing her for a full visit with myself. She is having some pain at this site as well but fortunately nothing too significant at this point. No fevers, chills, nausea, or vomiting noted at this time. 3/18-Patient is back to the clinic with the left leg venous leg ulcer, the ulcer is larger in size, has a surface that is densely adherent with fibrinous tissue, the Hydrofera Blue was used but is densely adherent and there was difficulty in removing it. The right lower extremity was also wrapped for weeping edema. Patient has a new area over the left lateral foot above the malleolus  that is small and appears to have no debris with intact surrounding skin. Patient is on increased dose of Lasix also as a means to edema management 3/25; the patient has a nonhealing venous ulcer on the medial left leg and last week developed a smaller area on the lateral left calf. We have been using Hydrofera Blue with a contact layer. 4/1; no major change in these wounds areas. Left medial and more recently left lateral calf. I tried Iodoflex last week  to aid in debridement she did not tolerate this. She stated her pain was terrible all week. She took the top layer of the 4 layer compression off. 4/8; the patient actually looks somewhat better in terms of her more prominent left lateral calf wound. There is some healthy looking tissue here. She is still complaining of a lot of discomfort. 4/15; patient in a lot of pain secondary to sciatica. She is on a prednisone taper prescribed by her primary physician. She has the 2 areas one on the left medial and more recently a smaller area on the left lateral calf. Both of these just above the malleoli 4/22; her back pain is better but she still states she is very uncomfortable and now feels she is intolerant to the [employer]. No real change in the wounds we have been using Sorbact. She has been previously intolerant to Iodoflex. There is not a lot of option about what we can use to debride this wound under compression that she no doubt needs. sHe states Ultram no longer works for her pain 4/29; no major change in the wounds slightly increased depth. Surface on the original medial wound perhaps somewhat improved however the more recent area on the lateral left ankle is 100% covered in very adherent debris we have been using Sorbact. She tolerates 4 layer compression well and her edema control is a lot better. She has not had to come in for a nurse check 5/6; no major change in the condition of the wounds. She did consent to debridement today which was  done with some difficulty. Continuing Sorbact. She did not tolerate Iodoflex. She was in for a check of her compression the day after we wrapped her last week this was adjusted but nothing much was found 5/13; no major change in the condition or area of the wounds. I was able to get a fairly aggressive debridement done on the lateral left leg wound. Even using Sorbact under compression. She came back in on Friday to have the wrap changed. She says she felt uncomfortable on the lateral aspect of her ankle. She has a long history of chronic venous insufficiency including previous ablation surgery on this side. 5/20-Patient returns for wounds on left leg with both wounds covered in slough, with the lateral leg wound larger in size, she has been in 3 layer compression and felt more comfortable, she describes pain in ankle, in leg and pins and needles in foot, Gallardo, Mykael J. ([id number]) and is about to try Pamelor for this 6/3; wounds on the left lateral and left medial leg. The area medially which is the most recent of the 2 seems to have had the largest increase in dimensions. We have been using Sorbac to try and debride the surface. She has been to see orthopedics they apparently did a plain x-ray that was indeterminant. Diagnosed her with neuropathy and they have ordered an MRI to determine if there is underlying osteomyelitis. This was not high on my thought list but I suppose it is prudent. We have advised her to make an appointment with vein and vascular in [place]. She has a history of a left greater saphenous and accessory vein ablations I wonder if there is anything else that can be done from a surgical point of view to help in these difficult refractory wounds. We have previously healed this wound on one occasion but it keeps on reopening [medial side] 6/10; deep tissue culture I did last week I think on the  left medial wound showed both moderate E. coli and moderate staph aureus  [MSSA]. She is going to require antibiotics and I have chosen Augmentin. We have been using Sorbact and we have made better looking wound surface on both sides but certainly no improvement in wound area. She was back in last Friday apparently for a dressing changes the wrap was hurting her outer left ankle. She has not managed to get a hold of vein and vascular in [place]. We are going to have to make her that appointment 6/17; patient is tolerating the Augmentin. She had an MRI that I think was ordered by orthopedic surgeon this did not show osteomyelitis or an abscess did suggest cellulitis. We have been using Sorbact to the lateral and medial ankles. We have been trying to arrange a follow-up appointment with vein and vascular in [place] or did her original ablations. We apparently an area sent the request to vein and vascular in [facility] 6/24; patient has completed the Augmentin. We do not yet have a vein and vascular appointment in [place]. I am not sure what the issue is here we have asked her to call tomorrow. We are using Sorbact. Making some improvements and especially the medial wound. Both surfaces however look better medial and lateral. 7/1; the patient has been in contact with vein and vascular in [place] but has not yet received an appointment. Using Sorbact we have gradually improve the wound surface with no improvement in surface area. She is approved for Apligraf but the wound surface still is not completely viable. She has not had to come in for a dressing change 7/8; the patient has an appointment with vein and vascular on 7/31 which is a Friday afternoon. She is concerned about getting back here for [place] to dress her wounds. I think it is important to have them goal for her venous reflux/history of ablations etc. to see if anything else can be done. She apparently tested positive for 1 of the blood tests with regards to lupus and saw a rheumatologist. He has raised  the issue of vasculitis again. I have had this thought in the past however the evidence seems overwhelming that this is a venous reflux etiology. If the rheumatologist tells me there is clinical and laboratory investigation is positive for lupus I will rethink this. 7/15; the patient's wound surfaces are quite a bit better. The medial area which was her original wound now has no depth although the lateral wound which was the more recent area actually appears larger. Both with viable surfaces which is indeed better. Using Sorbact. I wanted to use Apligraf on her however there is the issue of the vein and vascular appointment on 7/31 at 2:00 in the afternoon which would not allow her to get back to be rewrapped and they would no doubt remove the graft 7/22; the patient's wound surfaces have moderate amount of debris although generally look better. The lateral one is larger with 2 small satellite areas superiorly. We are waiting for her vein and vascular appointment on 7/31. She has been approved for Apligraf which I would like to use after th 7/29; wound surfaces have improved no debridement is required we have been using Sorbact. She sees vein and vascular on Friday with this so question of whether anything can be done to lessen the likelihood of recurrence and/or speed the healing of these areas. She is already had previous ablations. She no doubt has severe venous hypertension 8/5-Patient returns at 1 week, she  was in [employer] for 3 days by her podiatrist, we have been using so backed to the wound, she has increased pain in both the wounds on the left lower leg especially the more distal one on the lateral aspect 8/12-Patient returns at 1 week and she is agreeable to having debridement in both wounds on her left leg today. We have been using Sorbact, and vascular studies were reviewed at last visit 8/19; the patient arrives with her wounds fairly clean and no debridement is required. We have used  Sorbact which is really done a nice job in cleaning up these very difficult wound surfaces. The patient saw Dr. Donzetta Matters of vascular surgery on 7/31. He did not feel that there was an arterial component. He felt that her treated greater saphenous vein is adequately addressed and that the small saphenous vein did not appear to be involved significantly. She was also noted to have deep venous reflux which is not treatable. Dr. Donzetta Matters mentioned the possibility of a central obstructive component leading to reflux and he offered her central venography. She wanted to discuss this or think about it. I have urged her to go ahead with this. She has had recurrent difficult wounds in these areas which do heal but after months in the clinic. If there is anything that can be done to reduce the likelihood of this I think it is worth it. 9/2 she is still working towards getting follow-up with Dr. Donzetta Matters to schedule her CT. Things are quite a bit worse venography. I put Apligraf on 2 weeks ago on both wounds on the medial and lateral part of her left lower leg. She arrives in clinic today with 3 superficial additional wounds above the area laterally and one below the wound medially. She describes a lot of discomfort. I think these are probably wrapped injuries. Does not look like she has cellulitis. 07/20/2019 on evaluation today patient appears to be doing somewhat poorly in regard to her lower extremity ulcers. She in fact showed signs of erythema in fact we may even be dealing with an infection at this time. Unfortunately I am unsure if this CHAELI, VILLAROSA ([id number]) is just infection or if indeed there may be some allergic reaction that occurred as a result of the Apligraf application. With that being said that would be unusual but nonetheless not impossible in this patient is one who is unfortunately allergic to quite a bit. Currently we have been using the Sorbact which seems to do as well as anything for her. I do  think we may want to obtain a culture today to see if there is anything showing up there that may need to be addressed. 9/16; noted that last week the wounds look worse in 1 week follow-up of the Apligraf. Using Sorbact as of 2 days ago. She arrives with copious amounts of drainage and new skin breakdown on the back of the left calf. The wounds arm more substantial bilaterally. There is a fair amount of swelling in the left calf no overt DVT there is edema present I think in the left greater than right thigh. She is supposed to go on 9/28 for CT venography. The wounds on the medial and lateral calf are worse and she has new skin breakdown posteriorly at least new for me. This is almost developing into a circumferential wound area The Apligraf was taken off last week which I agree with things are not going in the right direction a culture was done we  do not have that back yet. She is on Augmentin that she started 2 days ago 9/23; dressing was changed by her nurses on Monday. In general there is no improvement in the wound areas although the area looks less angry than last week. She did get Augmentin for MSSA cultured on the 14th. She still appears to have too much swelling in the left leg even with 3 layer compression 9/30; the patient underwent her procedure on 9/28 by Dr. Donzetta [facility]. She was discovered to have the common iliac vein measuring 12.2 mm but at the level of L4-L5 measured 3 mm. After stenting it measured 10 mm. 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 Rouleaux flow in the common femoral and femoral vein was observed much improved after stenting. We are using silver alginate to the wounds on the medial and lateral ankle on the left. 4 layer compression 10/7; the patient had fluid swelling around her knee and 4 layer compression. At the advice of vein and vascular this was reduced to 3 layer which she is tolerating better. We have been using  silver alginate under 3 layer compression since last Friday 10/14; arrives with the areas on the left ankle looking a lot better. Inflammation in the area also a lot better. She came in for a nurse check on 10/9 Electronic Signature(s) Signed: 08/19/2019 5:10:21 PM By: Linton Ham MD Entered By: Linton Ham on 08/19/2019 13:33:23 KYNSEY, SUSKO ([id number]) -------------------------------------------------------------------------------- Physical Exam Details Patient Name: RUDENE, ANSELL. Date of Service: 08/19/2019 12:45 PM Medical Record Number: [id number] Patient Account Number: [id number] Date of Birth/Sex: Feb 28, 1946 (73 y.o. F) Treating RN: Cornell Barman Primary Care Provider: [name] Other Clinician: Referring Provider: [name] Treating Provider/Extender: Tito Dine in Treatment: 23 Constitutional Patient is hypertensive.. Pulse regular and within target range for patient.Marland Kitchen Respirations regular, non-labored and within target range.. Temperature is normal and within the target range for the patient.Marland Kitchen appears in no distress. Eyes Conjunctivae clear. No discharge. Respiratory Respiratory effort is easy and symmetric bilaterally. Rate is normal at rest and on room air.. Cardiovascular Pedal pulses palpable. Edema is well controlled. Integumentary (Hair, Skin) Changes of chronic venous hypertension in her feet especially laterally. Psychiatric No evidence of depression, anxiety, or agitation. Calm, cooperative, and communicative. Appropriate interactions and affect.. Notes Wound exam; the 2 original areas on the left medial and left lateral ankle have a much better looking surface today no debridement was necessary. On the left there is a small satellite lesion anteriorly and distally to the area on the left lateral. No mechanical debridement was required. Inflammation is a lot better Electronic Signature(s) Signed: 08/19/2019 5:10:21 PM By:  Linton Ham MD Entered By: Linton Ham on 08/19/2019 13:34:50 Conigliaro, [name] -------------------------------------------------------------------------------- Physician Orders Details Patient Name: ANBER, KARPENKO. Date of Service: 08/19/2019 12:45 PM Medical Record Number: [id number] Patient Account Number: [id number] Date of Birth/Sex: 22-[name]-1946 (73 y.o. F) Treating RN: Montey Hora Primary Care Provider: [name] Other Clinician: Referring Provider: [name] Treating Provider/Extender: Tito Dine in Treatment: 22 Verbal / Phone Orders: No Diagnosis Coding Wound Cleansing Wound #5 Left,Medial Lower Leg o Cleanse wound with mild soap and water Wound #6 Left,Lateral Lower Leg o Cleanse wound with mild soap and water Wound #9 Left,Lateral,Posterior Lower Leg o Cleanse wound with mild soap and water Anesthetic (add to Medication List) Wound #5 Left,Medial Lower Leg o Topical Lidocaine 4% cream applied to wound bed  prior to debridement (In Clinic Only). Wound #6 Left,Lateral Lower Leg o Topical Lidocaine 4% cream applied to wound bed prior to debridement (In Clinic Only). Wound #9 Left,Lateral,Posterior Lower Leg o Topical Lidocaine 4% cream applied to wound bed prior to debridement (In Clinic Only). Skin Barriers/Peri-Wound Care Wound #5 Left,Medial Lower Leg o Triamcinolone Acetonide Ointment (TCA) Wound #6 Left,Lateral Lower Leg o Triamcinolone Acetonide Ointment (TCA) Wound #9 Left,Lateral,Posterior Lower Leg o Triamcinolone Acetonide Ointment (TCA) Primary Wound Dressing Wound #5 Left,Medial Lower Leg o Silver Alginate Wound #6 Left,Lateral Lower Leg o Silver Alginate Wound #9 Left,Lateral,Posterior Lower Leg o Silver Alginate Secondary Dressing Wound #5 Left,Medial Lower Leg o Drawtex JYNESIS, MALTESE. ([id number]) Wound #6 Left,Lateral Lower Leg o Drawtex Wound #9 Left,Lateral,Posterior  Lower Leg o Drawtex Dressing Change Frequency Wound #5 Left,Medial Lower Leg o Change dressing every week o Other: - as needed. Wound #6 Left,Lateral Lower Leg o Change dressing every week o Other: - as needed. Wound #9 Left,Lateral,Posterior Lower Leg o Change dressing every week o Other: - as needed. Follow-up Appointments Wound #5 Left,Medial Lower Leg o Return Appointment in 1 week. o Nurse Visit as needed - Call of needed Wound #6 Left,Lateral Lower Leg o Return Appointment in 1 week. o Nurse Visit as needed - Call of needed Wound #9 Left,Lateral,Posterior Lower Leg o Return Appointment in 1 week. o Nurse Visit as needed - Call of needed Edema Control Wound #5 Left,Medial Lower Leg o 3 Layer Compression System - Left Lower Extremity Wound #6 Left,Lateral Lower Leg o 3 Layer Compression System - Left Lower Extremity Wound #9 Left,Lateral,Posterior Lower Leg o 3 Layer Compression System - Left Lower Extremity Electronic Signature(s) Signed: 08/19/2019 4:44:26 PM By: Montey Hora Signed: 08/19/2019 5:10:21 PM By: Linton Ham MD Entered By: Montey Hora on 08/19/2019 13:12:11 TAMIRA, CULHANE ([id number]) -------------------------------------------------------------------------------- Problem List Details Patient Name: KIAHRA, SEYBERT. Date of Service: 08/19/2019 12:45 PM Medical Record Number: [id number] Patient Account Number: [id number] Date of Birth/Sex: [name] 27, 1946 (73 y.o. F) Treating RN: Cornell Barman Primary Care Provider: [name] Other Clinician: Referring Provider: [name] Treating Provider/Extender: Tito Dine in Treatment: 36 Active Problems ICD-10 Evaluated Encounter Code Description Active Date Today Diagnosis L97.221 Non-pressure chronic ulcer of left calf limited to breakdown of 01/07/2019 No Yes skin I87.321 Chronic venous hypertension (idiopathic) with inflammation of 12/10/2018 No  Yes right lower extremity I89.0 Lymphedema, not elsewhere classified 12/10/2018 No Yes Inactive Problems Resolved Problems Electronic Signature(s) Signed: 08/19/2019 5:10:21 PM By: Linton Ham MD Entered By: Linton Ham on 08/19/2019 13:31:16 Laird, [name] -------------------------------------------------------------------------------- Progress Note Details Patient Name: DANYA, PIERUCCI. Date of Service: 08/19/2019 12:45 PM Medical Record Number: [id number] Patient Account Number: [id number] Date of Birth/Sex: [birth year] (73 y.o. F) Treating RN: Cornell Barman Primary Care Provider: [name] Other Clinician: Referring Provider: [name] Treating Provider/Extender: Tito Dine in Treatment: 36 Subjective History of Present Illness (HPI) Pleasant 73 year old with history of chronic venous insufficiency. No diabetes or peripheral vascular disease. Left ABI 1.29. Questionable history of left lower extremity DVT. She developed a recurrent ulceration on her left lateral calf in December 2015, which she attributes to poor diet and subsequent lower extremity edema. She underwent endovenous laser ablation of her left greater saphenous vein in 2010. She underwent laser ablation of accessory branch of left GSV in April 2016 by Dr. Kellie Simmering at [facility]. She was previously wearing Unna boots, which she tolerated well. Tolerating 2 layer compression and cadexomer  iodine. She returns to clinic for follow-up and is without new complaints. She denies any significant pain at this time. She reports persistent pain with pressure. No claudication or ischemic rest pain. No fever or chills. No drainage. READMISSION 11/13/16; this is a 73 year old woman who is not a diabetic. She is here for a review of a painful area on her left medial lower extremity. I note that she was seen here previously last year for wound I believe to be in the same area. At that time she  had undergone previously a left greater saphenous vein ablation by Dr. Kellie Simmering and she had a ablation of the anterior accessory branch of the left greater saphenous vein in March 2016. Seeing that the wound actually closed over. In reviewing the history with her today the ulcer in this area has been recurrent. She describes a biopsy of this area in 2009 that only showed stasis physiology. 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 Do not see that she has ever had arterial studies in the left leg. She tells me she has a follow-up with Dr. Kellie Simmering in roughly 10 days In any case she developed the reopening of this area roughly a month ago. On the background of this she describes rapidly increasing edema which has responded to Lasix 40 mg and metolazone 2.5 mg as well as the patient's lymph massage. She has been told she has both venous insufficiency and lymphedema but she cannot tolerate compression stockings 11/28/16; the patient saw Dr. Kellie Simmering recently. Per the patient he did arterial Dopplers in the office that did not show evidence of arterial insufficiency, per the patient he stated "treat this like an ordinary venous ulcer". She also saw her dermatologist Dr. Ronnald Ramp who felt that this was more of a vascular ulcer. In general things are improving although she arrives today with increasing bilateral lower extremity edema with weeping a deeper fluid through the wound on the left medial leg compatible with some degree of lymphedema 12/04/16; the patient's wound is fully epithelialized but I don't think fully healed. We will do another week of depression with Promogran and TCA however I suspect we'll be able to discharge her next week. This is a very unusual-looking wound which was  initially a figure-of-eight type wound lying on its side surrounded by petechial like hemorrhage. She has had venous ablation on this side. She apparently does not have an arterial issue per Dr. Kellie Simmering. She saw her dermatologist thought it was "vascular". Patient is definitely going to need ongoing compression and I talked about this with her today she will go to elastic therapy after she leaves here next week 12/11/16; the patient's wound is not completely closed today. She has surrounding scar tissue and in further discussion with the patient it would appear that she had ulcers in this area in 2009 for a prolonged period of time ultimately requiring a punch biopsy of this area that only showed venous insufficiency. I did not previously pickup on this part of the history from the patient. 12/18/16; the patient's wound is completely epithelialized. There is no open area here. She has significant bilateral venous insufficiency with secondary lymphedema to a mild-to-moderate degree she does not have compression stockings.. She did not say anything to me when I  was in the room, she told our intake nurse that she was still having pain in this area. This isn't unusual recurrent small open area. She is going to go to elastic therapy to obtain compression stockings. 12/25/16; the patient's wound is fully epithelialized. There is no open area here. The patient describes some continued episodic discomfort in this area medial left calf. However everything looks fine and healed here. She is been to elastic therapy and CLARECE, SZEKERES ([id number]) caught herself 15-20 mmHg stockings, they apparently were having trouble getting 20-30 mm stockings in her size 01/22/17; this is a patient we discharged from the clinic a month ago. She has a recurrent open wound on her medial left calf. She had 15 mm support stockings. I told her I thought she needed 20-30 mm compression stockings. She tells me that she has been ill  with hospitalization secondary to asthma and is been found to have severe hypokalemia likely secondary to a combination of Lasix and metolazone. This morning she noted blistering and leaking fluid on the posterior part of her left leg. She called our intake nurse urgently and we was saw her this afternoon. She has not had any real discomfort here. I don't know that she's been wearing any stockings on this leg for at least 2-3 days. ABIs in this clinic were 1.21 on the right and 1.3 on the left. She is previously seen vascular surgery who does not think that there is a peripheral arterial issue. 01/30/17; Patient arrives with no open wound on the left leg. She has been to elastic therapy and obtained 20-77mmhg below knee stockings and she has one on the right leg today. READMISSION 02/19/18; this Burgueno is a now 73 year old patient we've had in this clinic perhaps 3 times before. I had last looked at her from January 07 December 2016 with an area on the medial left leg. We discharged her on 12/25/16 however she had to be readmitted on 01/22/17 with a recurrence. I have in my notes that we discharged her on 20-30 mm stockings although she tells me she was only wearing support hose because she cannot get stockings on predominantly related to her cervical spine surgery/issues. She has had previous ablations done by vein and vascular in [place] including a great saphenous vein ablation on the left with an anterior accessory branch ablation I think both of these were in 2016. On one of the previous visit she had a biopsy noted 2009 that was negative. She is not felt to have an arterial issue. She is not a diabetic. She does have a history of obstructive sleep apnea hypertension asthma as well as chronic venous insufficiency and lymphedema. On this occasion she noted 2 dry scaly patch on her left leg. She tried to put lotion on this it didn't really help. There were 2 open areas.the patient has been seeing  her primary physician from 02/05/18 through 02/14/18. She had Unna boots applied. The superior wound now on the lateral left leg has closed but she's had one wound that remains open on the lateral left leg. This is not the same spot as we dealt with in 2018. ABIs in this clinic were 1.3 bilaterally 02/26/18; patient has a small wound on the left lateral calf. Dimensions are down. She has chronic venous insufficiency and lymphedema. 03/05/18; small open area on the left lateral calf. Dimensions are down. Tightly adherent necrotic debris over the surface of the wound which was difficult to remove. Also the dressing [over  collagen] stuck to the wound surface. This was removed with some difficulty as well. Change the primary dressing to Hydrofera Blue ready 03/12/18; small open area on the left lateral calf. Comes in with tightly adherent surface eschar as well as some adherent Hydrofera Blue. 03/19/18; open area on the left lateral calf. Again adherent surface eschar as well as some adherent Hydrofera Blue nonviable subcutaneous tissue. She complained of pain all week even with the reduction from 4-3 layer compression I put on last week. Also she had an increase in her ankle and calf measurements probably related to the same thing. 03/26/18; open area on the left lateral calf. A very small open area remains here. We used silver alginate starting last week as the Hydrofera Blue seem to stick to the wound bed. In using 4-layer compression 04/02/18; the open area in the left lateral calf at some adherent slough which I removed there is no open area here. We are able to transition her into her own compression stocking. Truthfully I think this is probably his support hose. However this does not maintain skin integrity will be limited. She cannot put over the toe compression stockings on because of neck problems hand problems etc. She is allergic to the lining layer of juxta lites. We might be forced to use  extremitease stocking should this fail READMIT 11/24/2018 Patient is now a 73 year old woman who is not a diabetic. She has been in this clinic on at least 3 previous occasions largely with recurrent wounds on her left leg secondary to chronic venous insufficiency with secondary lymphedema. Her situation is complicated by inability to get stockings on and an allergy to neoprene which is apparently a component and at least juxta lites and other stockings. As a result she really has not been wearing any stockings on her legs. She tells [place] that roughly 2 or 3 weeks ago she started noticing a stinging sensation just above her ankle on the left medial aspect. She has been diagnosed with pseudogout and she wondered whether this was what she was experiencing. She tried to dress this with something she bought at the store however subsequently it pulled skin off and now she has an open wound that is not improving. She has been using Vaseline gauze with a cover bandage. She saw her primary doctor last week who put an Haematologist on her. ABIs in this clinic was 1.03 on the left CARELI, HOHENSTEIN. ([id number]) 2/12; the area is on the left medial ankle. Odd-looking wound with what looks to be surface epithelialization but a multitude of small petechial openings. This clearly not closed yet. We have been using silver alginate under 3 layer compression with TCA 2/19; the wound area did not look quite as good this week. Necrotic debris over the majority of the wound surface which required debridement. She continues to have a multitude of what looked to be small petechial openings. She reminds [place] that she had a biopsy on this initially during her first outbreak in 2015 in [place] dermatology. She expresses concern about this being a possible melanoma. 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 I assured her that this does not  look anything like melanoma. Except for the petechial reaction it does look like a venous insufficiency area and she certainly has evidence of this on both sides 2/26; a difficult area on the left medial ankle. The patient clearly has chronic venous  hypertension with some degree of lymphedema. The odd thing about the area is the small petechial hemorrhages. I am not really sure how to explain this. This was present last time and this is not a compression injury. We have been using Hydrofera Blue which I changed to last week 3/4; still using Hydrofera Blue. Aggressive debridement today. She does not have known arterial issues. She has seen Dr. Kellie Simmering at [facility] vein and vascular and and has an ablation on the left. [Anterior accessory branch of the greater saphenous]. From what I remember they did not feel she had an arterial issue. The patient has had this area biopsied in 2009 at [facility] dermatology and by her recollection they said this was "stasis". She is also follow-up with dermatology locally who thought that this was more of a vascular issue 3/11; using Hydrofera Blue. Aggressive debridement today. She does not have an arterial issue. We are using 3 layer compression although we may need to go to 4. The patient has been in for multiple changes to her wrap since I last saw her a week ago. She says that the area was leaking. I do not have too much more information on what was found 01/19/19 on evaluation today patient was actually being seen for a nurse visit when unfortunately she had the area on her left lateral lower extremity as well as weeping from the right lower extremity that became apparent. Therefore we did end up actually seeing her for a full visit with myself. She is having some pain at this site as well but fortunately nothing too significant at this point. No fevers, chills, nausea, or vomiting noted at this time. 3/18-Patient is back to the clinic with the left leg venous leg  ulcer, the ulcer is larger in size, has a surface that is densely adherent with fibrinous tissue, the Hydrofera Blue was used but is densely adherent and there was difficulty in removing it. The right lower extremity was also wrapped for weeping edema. Patient has a new area over the left lateral foot above the malleolus that is small and appears to have no debris with intact surrounding skin. Patient is on increased dose of Lasix also as a means to edema management 3/25; the patient has a nonhealing venous ulcer on the medial left leg and last week developed a smaller area on the lateral left calf. We have been using Hydrofera Blue with a contact layer. 4/1; no major change in these wounds areas. Left medial and more recently left lateral calf. I tried Iodoflex last week to aid in debridement she did not tolerate this. She stated her pain was terrible all week. She took the top layer of the 4 layer compression off. 4/8; the patient actually looks somewhat better in terms of her more prominent left lateral calf wound. There is some healthy looking tissue here. She is still complaining of a lot of discomfort. 4/15; patient in a lot of pain secondary to sciatica. She is on a prednisone taper prescribed by her primary physician. She has the 2 areas one on the left medial and more recently a smaller area on the left lateral calf. Both of these just above the malleoli 4/22; her back pain is better but she still states she is very uncomfortable and now feels she is intolerant to the [employer]. No real change in the wounds we have been using Sorbact. She has been previously intolerant to Iodoflex. There is not a lot of option about what we  can use to debride this wound under compression that she no doubt needs. sHe states Ultram no longer works for her pain 4/29; no major change in the wounds slightly increased depth. Surface on the original medial wound perhaps somewhat improved however the more recent  area on the lateral left ankle is 100% covered in very adherent debris we have been using Sorbact. She tolerates 4 layer compression well and her edema control is a lot better. She has not had to come in for a nurse check 5/6; no major change in the condition of the wounds. She did consent to debridement today which was done with some difficulty. Continuing Sorbact. She did not tolerate Iodoflex. She was in for a check of her compression the day after we wrapped her last week this was adjusted but nothing much was found 5/13; no major change in the condition or area of the wounds. I was able to get a fairly aggressive debridement done on the lateral left leg wound. Even using Sorbact under compression. She came back in on Friday to have the wrap changed. She says she felt uncomfortable on the lateral aspect of her ankle. She has a long history of chronic venous insufficiency including previous ablation surgery on this side. 5/20-Patient returns for wounds on left leg with both wounds covered in slough, with the lateral leg wound larger in size, she has been in 3 layer compression and felt more comfortable, she describes pain in ankle, in leg and pins and needles in foot, and is about to try Pamelor for this 6/3; wounds on the left lateral and left medial leg. The area medially which is the most recent of the 2 seems to have had the largest increase in dimensions. We have been using Sorbac to try and debride the surface. She has been to see orthopedics RHYANNA, LAUREL ([id number]) they apparently did a plain x-ray that was indeterminant. Diagnosed her with neuropathy and they have ordered an MRI to determine if there is underlying osteomyelitis. This was not high on my thought list but I suppose it is prudent. We have advised her to make an appointment with vein and vascular in [place]. She has a history of a left greater saphenous and accessory vein ablations I wonder if there is anything else  that can be done from a surgical point of view to help in these difficult refractory wounds. We have previously healed this wound on one occasion but it keeps on reopening [medial side] 6/10; deep tissue culture I did last week I think on the left medial wound showed both moderate E. coli and moderate staph aureus [MSSA]. She is going to require antibiotics and I have chosen Augmentin. We have been using Sorbact and we have made better looking wound surface on both sides but certainly no improvement in wound area. She was back in last Friday apparently for a dressing changes the wrap was hurting her outer left ankle. She has not managed to get a hold of vein and vascular in [place]. We are going to have to make her that appointment 6/17; patient is tolerating the Augmentin. She had an MRI that I think was ordered by orthopedic surgeon this did not show osteomyelitis or an abscess did suggest cellulitis. We have been using Sorbact to the lateral and medial ankles. We have been trying to arrange a follow-up appointment with vein and vascular in [place] or did her original ablations. We apparently an area sent the request to vein and vascular  in [facility] 6/24; patient has completed the Augmentin. We do not yet have a vein and vascular appointment in [place]. I am not sure what the issue is here we have asked her to call tomorrow. We are using Sorbact. Making some improvements and especially the medial wound. Both surfaces however look better medial and lateral. 7/1; the patient has been in contact with vein and vascular in [place] but has not yet received an appointment. Using Sorbact we have gradually improve the wound surface with no improvement in surface area. She is approved for Apligraf but the wound surface still is not completely viable. She has not had to come in for a dressing change 7/8; the patient has an appointment with vein and vascular on 7/31 which is a Friday afternoon.  She is concerned about getting back here for [place] to dress her wounds. I think it is important to have them goal for her venous reflux/history of ablations etc. to see if anything else can be done. She apparently tested positive for 1 of the blood tests with regards to lupus and saw a rheumatologist. He has raised the issue of vasculitis again. I have had this thought in the past however the evidence seems overwhelming that this is a venous reflux etiology. If the rheumatologist tells me there is clinical and laboratory investigation is positive for lupus I will rethink this. 7/15; the patient's wound surfaces are quite a bit better. The medial area which was her original wound now has no depth although the lateral wound which was the more recent area actually appears larger. Both with viable surfaces which is indeed better. Using Sorbact. I wanted to use Apligraf on her however there is the issue of the vein and vascular appointment on 7/31 at 2:00 in the afternoon which would not allow her to get back to be rewrapped and they would no doubt remove the graft 7/22; the patient's wound surfaces have moderate amount of debris although generally look better. The lateral one is larger with 2 small satellite areas superiorly. We are waiting for her vein and vascular appointment on 7/31. She has been approved for Apligraf which I would like to use after th 7/29; wound surfaces have improved no debridement is required we have been using Sorbact. She sees vein and vascular on Friday with this so question of whether anything can be done to lessen the likelihood of recurrence and/or speed the healing of these areas. She is already had previous ablations. She no doubt has severe venous hypertension 8/5-Patient returns at 1 week, she was in [place] for 3 days by her podiatrist, we have been using so backed to the wound, she has increased pain in both the wounds on the left lower leg especially the more distal  one on the lateral aspect 8/12-Patient returns at 1 week and she is agreeable to having debridement in both wounds on her left leg today. We have been using Sorbact, and vascular studies were reviewed at last visit 8/19; the patient arrives with her wounds fairly clean and no debridement is required. We have used Sorbact which is really done a nice job in cleaning up these very difficult wound surfaces. The patient saw Dr. Donzetta Matters of vascular surgery on 7/31. He did not feel that there was an arterial component. He felt that her treated greater saphenous vein is adequately addressed and that the small saphenous vein did not appear to be involved significantly. She was also noted to have deep venous reflux which  is not treatable. Dr. Donzetta Matters mentioned the possibility of a central obstructive component leading to reflux and he offered her central venography. She wanted to discuss this or think about it. I have urged her to go ahead with this. She has had recurrent difficult wounds in these areas which do heal but after months in the clinic. If there is anything that can be done to reduce the likelihood of this I think it is worth it. 9/2 she is still working towards getting follow-up with Dr. Donzetta Matters to schedule her CT. Things are quite a bit worse venography. I put Apligraf on 2 weeks ago on both wounds on the medial and lateral part of her left lower leg. She arrives in clinic today with 3 superficial additional wounds above the area laterally and one below the wound medially. She describes a lot of discomfort. I think these are probably wrapped injuries. Does not look like she has cellulitis. 07/20/2019 on evaluation today patient appears to be doing somewhat poorly in regard to her lower extremity ulcers. She in fact showed signs of erythema in fact we may even be dealing with an infection at this time. Unfortunately I am unsure if this is just infection or if indeed there may be some allergic reaction that  occurred as a result of the Apligraf application. With that being said that would be unusual but nonetheless not impossible in this patient is one who is unfortunately allergic to quite a bit. Currently we have been using the Sorbact which seems to do as well as anything for her. I do think we may want to obtain DRUCELLA, [facility] ([id number]) a culture today to see if there is anything showing up there that may need to be addressed. 9/16; noted that last week the wounds look worse in 1 week follow-up of the Apligraf. Using Sorbact as of 2 days ago. She arrives with copious amounts of drainage and new skin breakdown on the back of the left calf. The wounds arm more substantial bilaterally. There is a fair amount of swelling in the left calf no overt DVT there is edema present I think in the left greater than right thigh. She is supposed to go on 9/28 for CT venography. The wounds on the medial and lateral calf are worse and she has new skin breakdown posteriorly at least new for me. This is almost developing into a circumferential wound area The Apligraf was taken off last week which I agree with things are not going in the right direction a culture was done we do not have that back yet. She is on Augmentin that she started 2 days ago 9/23; dressing was changed by her nurses on Monday. In general there is no improvement in the wound areas although the area looks less angry than last week. She did get Augmentin for MSSA cultured on the 14th. She still appears to have too much swelling in the left leg even with 3 layer compression 9/30; the patient underwent her procedure on 9/28 by Dr. Donzetta Matters at vascular and vein specialist. She was discovered to have the common iliac vein measuring 12.2 mm but at the level of L4-L5 measured 3 mm. After stenting it measured 10 mm. 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 Rouleaux flow in the common femoral and femoral vein was observed much improved  after stenting. We are using silver alginate to the wounds on the medial and lateral ankle on the left. 4 layer compression 10/7; the  patient had fluid swelling around her knee and 4 layer compression. At the advice of vein and vascular this was reduced to 3 layer which she is tolerating better. We have been using silver alginate under 3 layer compression since last Friday 10/14; arrives with the areas on the left ankle looking a lot better. Inflammation in the area also a lot better. She came in for a nurse check on 10/9 Objective Constitutional Patient is hypertensive.. Pulse regular and within target range for patient.Marland Kitchen Respirations regular, non-labored and within target range.. Temperature is normal and within the target range for the patient.Marland Kitchen appears in no distress. Vitals Time Taken: 12:41 PM, Height: 63 in, Weight: 224.7 lbs, BMI: 39.8, Temperature: 98.5 F, Pulse: 81 bpm, Respiratory Rate: 16 breaths/min, Blood Pressure: 148/70 mmHg. Eyes Conjunctivae clear. No discharge. Respiratory Respiratory effort is easy and symmetric bilaterally. Rate is normal at rest and on room air.. Cardiovascular Pedal pulses palpable. Edema is well controlled. Psychiatric No evidence of depression, anxiety, or agitation. Calm, cooperative, and communicative. Appropriate interactions and affect.. General Notes: Wound exam; the 2 original areas on the left medial and left lateral ankle have a much better looking surface today no debridement was necessary. On the left there is a small satellite lesion anteriorly and distally to the area on the left lateral. No mechanical debridement was required. Inflammation is a lot better LINETTE, OSTERMEYER ([id number]) Integumentary (Hair, Skin) Changes of chronic venous hypertension in her feet especially laterally. Wound #5 status is Open. Original cause of wound was Gradually Appeared. The wound is located on the Left,Medial Lower Leg. The wound measures 2.7cm  length x 2.7cm width x 0.1cm depth; 5.726cm^2 area and 0.573cm^3 volume. There is Fat Layer (Subcutaneous Tissue) Exposed exposed. There is no tunneling or undermining noted. There is a large amount of serosanguineous drainage noted. The wound margin is flat and intact. There is small (1-33%) pale granulation within the wound bed. There is a large (67-100%) amount of necrotic tissue within the wound bed including Adherent Slough. Wound #6 status is Open. Original cause of wound was Gradually Appeared. The wound is located on the Left,Lateral Lower Leg. The wound measures 3cm length x 3.2cm width x 0.1cm depth; 7.54cm^2 area and 0.754cm^3 volume. There is Fat Layer (Subcutaneous Tissue) Exposed exposed. There is no tunneling or undermining noted. There is a large amount of serosanguineous drainage noted. The wound margin is flat and intact. There is small (1-33%) pink granulation within the wound bed. There is a large (67-100%) amount of necrotic tissue within the wound bed including Adherent Slough. Wound #9 status is Open. Original cause of wound was Gradually Appeared. The wound is located on the Left,Lateral,Posterior Lower Leg. The wound measures 5.5cm length x 4cm width x 0.1cm depth; 17.279cm^2 area and 1.728cm^3 volume. There is Fat Layer (Subcutaneous Tissue) Exposed exposed. There is no tunneling or undermining noted. There is a large amount of serosanguineous drainage noted. The wound margin is flat and intact. There is small (1-33%) pink granulation within the wound bed. There is a large (67-100%) amount of necrotic tissue within the wound bed including Adherent Slough. Assessment Active Problems ICD-10 Non-pressure chronic ulcer of left calf limited to breakdown of skin Chronic venous hypertension (idiopathic) with inflammation of right lower extremity Lymphedema, not elsewhere classified Plan Wound Cleansing: Wound #5 Left,Medial Lower Leg: Cleanse wound with mild soap and  water Wound #6 Left,Lateral Lower Leg: Cleanse wound with mild soap and water Wound #9 Left,Lateral,Posterior Lower Leg: Cleanse wound  with mild soap and water Anesthetic (add to Medication List): Wound #5 Left,Medial Lower Leg: Topical Lidocaine 4% cream applied to wound bed prior to debridement (In Clinic Only). Wound #6 Left,Lateral Lower Leg: Topical Lidocaine 4% cream applied to wound bed prior to debridement (In Clinic Only). Wound #9 Left,Lateral,Posterior Lower Leg: Topical Lidocaine 4% cream applied to wound bed prior to debridement (In Clinic Only). Skin Barriers/Peri-Wound Care: Wound #5 Left,Medial Lower Leg: LAVAUGHN, COURTADE ([id number]) Triamcinolone Acetonide Ointment (TCA) Wound #6 Left,Lateral Lower Leg: Triamcinolone Acetonide Ointment (TCA) Wound #9 Left,Lateral,Posterior Lower Leg: Triamcinolone Acetonide Ointment (TCA) Primary Wound Dressing: Wound #5 Left,Medial Lower Leg: Silver Alginate Wound #6 Left,Lateral Lower Leg: Silver Alginate Wound #9 Left,Lateral,Posterior Lower Leg: Silver Alginate Secondary Dressing: Wound #5 Left,Medial Lower Leg: Drawtex Wound #6 Left,Lateral Lower Leg: Drawtex Wound #9 Left,Lateral,Posterior Lower Leg: Drawtex Dressing Change Frequency: Wound #5 Left,Medial Lower Leg: Change dressing every week Other: - as needed. Wound #6 Left,Lateral Lower Leg: Change dressing every week Other: - as needed. Wound #9 Left,Lateral,Posterior Lower Leg: Change dressing every week Other: - as needed. Follow-up Appointments: Wound #5 Left,Medial Lower Leg: Return Appointment in 1 week. Nurse Visit as needed - Call of needed Wound #6 Left,Lateral Lower Leg: Return Appointment in 1 week. Nurse Visit as needed - Call of needed Wound #9 Left,Lateral,Posterior Lower Leg: Return Appointment in 1 week. Nurse Visit as needed - Call of needed Edema Control: Wound #5 Left,Medial Lower Leg: 3 Layer Compression System - Left Lower  Extremity Wound #6 Left,Lateral Lower Leg: 3 Layer Compression System - Left Lower Extremity Wound #9 Left,Lateral,Posterior Lower Leg: 3 Layer Compression System - Left Lower Extremity 1. I am continuing with the silver alginate TCA/drawtex/3 layer compression Electronic Signature(s) Signed: 08/19/2019 5:10:21 PM By: Linton Ham MD Entered By: Linton Ham on 08/19/2019 13:35:48 Mollett, [name] -------------------------------------------------------------------------------- SuperBill Details Patient Name: MELEENA, HERLONG. Date of Service: 08/19/2019 Medical Record Number: [id number] Patient Account Number: [id number] Date of Birth/Sex: 12/15/1945 (73 y.o. F) Treating RN: Montey Hora Primary Care Provider: [name] Other Clinician: Referring Provider: [name] Treating Provider/Extender: Tito Dine in Treatment: 36 Diagnosis Coding ICD-10 Codes Code Description [phone] Non-pressure chronic ulcer of left calf limited to breakdown of skin I87.321 Chronic venous hypertension (idiopathic) with inflammation of right lower extremity I89.0 Lymphedema, not elsewhere classified Facility Procedures CPT4 Code: [id number] Description: (Facility Use Only) [phone] - [facility] LEG Modifier: Quantity: 1 Physician Procedures CPT4 Code Description: [id number] - WC PHYS LEVEL 3 - EST PT ICD-10 Diagnosis Description L97.221 Non-pressure chronic ulcer of left calf limited to breakdown I87.321 Chronic venous hypertension (idiopathic) with inflammation of Modifier: of skin right lower extr Quantity: 1 emity Electronic Signature(s) Signed: 08/19/2019 5:10:21 PM By: Linton Ham MD Entered By: Linton Ham on 08/19/2019 13:36:50

## 2019-08-24 NOTE — Progress Notes (Signed)
[name] Visit Report for 08/14/2019 Arrival Information Details Patient Name: [name], [name]. Date of Service: 08/14/2019 1:15 PM Medical Record Number: [id number] Patient Account Number: [id number] Date of Birth/Sex: November 30, 1945 (73 y.o. F) Treating RN: Army Melia Primary Care Donaldson Richter: [name] Other Clinician: Referring Bernestine Holsapple: [name] Treating Domonic Kimball/Extender: Melburn Hake, HOYT Weeks in Treatment: 59 Visit Information History Since Last Visit Added or deleted any medications: No Patient Arrived: Ambulatory Any new allergies or adverse reactions: No Arrival Time: 13:07 Had a fall or experienced change in No Accompanied By: self activities of daily living that may affect Transfer Assistance: None risk of falls: Patient Identification Verified: Yes Signs or symptoms of abuse/neglect since last visito No Patient Requires Transmission-Based No Hospitalized since last visit: No Precautions: Has Dressing in Place as Prescribed: Yes Patient Has Alerts: Yes Pain Present Now: Yes Patient Alerts: Patient on Blood Thinner aspirin 81 Electronic Signature(s) Signed: 08/24/2019 8:11:06 AM By: Army Melia Entered By: Army Melia on 08/14/2019 13:07:27 Borboa, [name] -------------------------------------------------------------------------------- Compression Therapy Details Patient Name: [name]. Date of Service: 08/14/2019 1:15 PM Medical Record Number: [id number] Patient Account Number: [id number] Date of Birth/Sex: [birth year] (73 y.o. F) Treating RN: Army Melia Primary Care Evangelyne Loja: [name] Other Clinician: Referring Elishah Ashmore: [name] Treating Lilana Blasko/Extender: STONE III, HOYT Weeks in Treatment: 35 Compression Therapy Performed for Wound Assessment: Wound #6 Left,Lateral Lower Leg Performed By: Clinician Army Melia, RN Electronic Signature(s) Signed: 08/24/2019 8:11:06 AM By: Army Melia Entered By: Army Melia on 08/14/2019 13:23:09 Milos, [name] -------------------------------------------------------------------------------- Compression Therapy Details Patient Name: [name]. Date of Service: 08/14/2019 1:15 PM Medical Record Number: [id number] Patient Account Number: [id number] Date of Birth/Sex: 1946/08/31 (73 y.o. F) Treating RN: Army Melia Primary Care Charlis Harner: [name] Other Clinician: Referring Ellinore Merced: [name] Treating Siomara Burkel/Extender: STONE III, HOYT Weeks in Treatment: 35 Compression Therapy Performed for Wound Assessment: Wound #5 Left,Medial Lower Leg Performed By: Clinician Army Melia, RN Electronic Signature(s) Signed: 08/24/2019 8:11:06 AM By: Army Melia Entered By: Army Melia on 08/14/2019 13:23:09 Macgregor, [name] -------------------------------------------------------------------------------- Compression Therapy Details Patient Name: [name]. Date of Service: 08/14/2019 1:15 PM Medical Record Number: [id number] Patient Account Number: [id number] Date of Birth/Sex: April 20, 1946 (73 y.o. F) Treating RN: Army Melia Primary Care Domanic Matusek: [name] Other Clinician: Referring Samson Ralph: [name] Treating Josia Cueva/Extender: Melburn Hake, HOYT Weeks in Treatment: 35 Compression Therapy Performed for Wound Assessment: Wound #9 Left,Lateral,Posterior Lower Leg Performed By: Clinician Army Melia, RN Electronic Signature(s) Signed: 08/24/2019 8:11:06 AM By: Army Melia Entered By: Army Melia on 08/14/2019 13:23:09 PETERSTenna Child ([id number]) -------------------------------------------------------------------------------- Encounter Discharge Information Details Patient Name: [name]. Date of Service: 08/14/2019 1:15 PM Medical Record Number: [id number] Patient Account Number: [id number] Date of Birth/Sex: Mar 12, 1946 (73 y.o. F) Treating RN: Army Melia Primary Care Shanessa Hodak: [name] Other Clinician: Referring Cynia Abruzzo: [name] Treating Tyresha Fede/Extender: Melburn Hake, HOYT Weeks in Treatment: 68 Encounter Discharge Information Items Discharge Condition: Stable Ambulatory Status: Ambulatory Discharge Destination: Home Transportation: Private Auto Accompanied By: self Schedule Follow-up Appointment: Yes Clinical Summary of Care: Electronic Signature(s) Signed: 08/24/2019 8:11:06 AM By: Army Melia Entered By: Army Melia on 08/14/2019 13:23:32 Filip, [name] -------------------------------------------------------------------------------- Wound Assessment Details Patient Name: [name]. Date of Service: 08/14/2019 1:15 PM Medical Record Number: [id number] Patient Account Number: [id number] Date of Birth/Sex: 1945-12-06 (73 y.o. F) Treating RN: Army Melia Primary Care Antwone Capozzoli: [name] Other Clinician: Referring Brentin Shin: [name] Treating Haydyn Liddell/Extender:  STONE III, HOYT Weeks in Treatment: 35 Wound Status Wound Number: 5 Primary Etiology: Lymphedema Wound Location: Left, Medial Lower Leg Wound Status: Open Wounding Event: Gradually Appeared Date Acquired: 11/19/2018 Weeks Of Treatment: 35 Clustered Wound: No Wound Measurements Length: (cm) 2.9 Width: (cm) 2.9 Depth: (cm) 0.1 Area: (cm) 6.605 Volume: (cm) 0.661 % Reduction in Area: 22.7% % Reduction in Volume: 22.7% Wound Description Full Thickness Without Exposed Support Classification: Structures Electronic Signature(s) Signed: 08/24/2019 8:11:06 AM By: Army Melia Entered By: Army Melia on 08/14/2019 13:22:52 Grenda, [name] -------------------------------------------------------------------------------- Wound Assessment Details Patient Name: [name]. Date of Service: 08/14/2019 1:15 PM Medical Record Number: [id number] Patient Account Number: [id number] Date of  Birth/Sex: 1946/07/05 (73 y.o. F) Treating RN: Army Melia Primary Care Ashlyne Olenick: [name] Other Clinician: Referring Malak Duchesneau: [name] Treating Cher Egnor/Extender: STONE III, HOYT Weeks in Treatment: 35 Wound Status Wound Number: 6 Primary Etiology: Venous Leg Ulcer Wound Location: Left, Lateral Lower Leg Wound Status: Open Wounding Event: Gradually Appeared Date Acquired: 01/19/2019 Weeks Of Treatment: 29 Clustered Wound: No Wound Measurements Length: (cm) 10.2 Width: (cm) 7 Depth: (cm) 0.2 Area: (cm) 56.077 Volume: (cm) 11.215 % Reduction in Area: -25389.5% % Reduction in Volume: -50877.3% Wound Description Full Thickness Without Exposed Support Classification: Structures Electronic Signature(s) Signed: 08/24/2019 8:11:06 AM By: Army Melia Entered By: Army Melia on 08/14/2019 13:22:52 [name], [name] -------------------------------------------------------------------------------- Wound Assessment Details Patient Name: IGNACIO, SHULAR. Date of Service: 08/14/2019 1:15 PM Medical Record Number: [id number] Patient Account Number: [id number] Date of Birth/Sex: 1945-12-10 (73 y.o. F) Treating RN: Army Melia Primary Care Nicolis Boody: [name] Other Clinician: Referring Ruari Duggan: [name] Treating Madyn Ivins/Extender: STONE III, HOYT Weeks in Treatment: 35 Wound Status Wound Number: 9 Primary Etiology: Venous Leg Ulcer Wound Location: Left, Lateral, Posterior Lower Leg Wound Status: Open Wounding Event: Gradually Appeared Date Acquired: 07/08/2019 Weeks Of Treatment: 5 Clustered Wound: No Wound Measurements Length: (cm) 7 Width: (cm) 3.5 Depth: (cm) 0.1 Area: (cm) 19.242 Volume: (cm) 1.924 % Reduction in Area: -3305.7% % Reduction in Volume: -3275.4% Wound Description Full Thickness Without Exposed Support Classification: Structures Electronic Signature(s) Signed: 08/24/2019 8:11:06 AM By: Army Melia Entered  By: Army Melia on 08/14/2019 13:22:53

## 2019-08-26 ENCOUNTER — Encounter: Payer: Medicare Other | Admitting: Internal Medicine

## 2019-08-26 ENCOUNTER — Other Ambulatory Visit: Payer: Self-pay

## 2019-08-26 DIAGNOSIS — L97221 Non-pressure chronic ulcer of left calf limited to breakdown of skin: Secondary | ICD-10-CM | POA: Diagnosis not present

## 2019-08-26 DIAGNOSIS — L97222 Non-pressure chronic ulcer of left calf with fat layer exposed: Secondary | ICD-10-CM | POA: Diagnosis not present

## 2019-08-26 DIAGNOSIS — I890 Lymphedema, not elsewhere classified: Secondary | ICD-10-CM | POA: Diagnosis not present

## 2019-08-27 NOTE — Progress Notes (Signed)
FLORIDALMA, LIAO ([id number]) Visit Report for 08/26/2019 Arrival Information Details Patient Name: [name], [name]. Date of Service: 08/26/2019 12:45 PM Medical Record Number: [id number] Patient Account Number: [id number] Date of Birth/Sex: [birth year] (73 y.o. F) Treating RN: [name] Primary Care [name]: [name] Other Clinician: Referring [name]: [name] Treating [name]/Extender: [name] in Treatment: 75 Visit Information History Since Last Visit Added or deleted any medications: No Patient Arrived: Ambulatory Any new allergies or adverse reactions: No Arrival Time: 12:49 Had a fall or experienced change in No Accompanied By: self activities of daily living that may affect Transfer Assistance: Harrel Lemon Lift risk of falls: Patient Identification Verified: Yes Signs or symptoms of abuse/neglect since last visito No Secondary Verification Process Yes Hospitalized since last visit: No Completed: Has Dressing in Place as Prescribed: Yes Patient Requires Transmission-Based No Has Compression in Place as Prescribed: Yes Precautions: Pain Present Now: Yes Patient Has Alerts: Yes Patient Alerts: Patient on Blood Thinner aspirin 81 Electronic Signature(s) Signed: 08/26/2019 4:19:58 PM By: [name] Entered By: [name] on 08/26/2019 12:49:37 Streetman, [name] -------------------------------------------------------------------------------- Encounter Discharge Information Details Patient Name: [name], [name]. Date of Service: 08/26/2019 12:45 PM Medical Record Number: [id number] Patient Account Number: [id number] Date of Birth/Sex: 06/08/1946 (73 y.o. F) Treating RN: [name] Primary Care Tonnie Friedel: [name] Other Clinician: Referring Kahmya [name]: [name] Treating [name]/Extender: [name] in Treatment: 37 Encounter Discharge Information Items Post Procedure Vitals Discharge  Condition: Stable Temperature (F): 98.5 Ambulatory Status: Ambulatory Pulse (bpm): 91 Discharge Destination: Home Respiratory Rate (breaths/min): 18 Transportation: Private Auto Blood Pressure (mmHg): 143/77 Accompanied By: self Schedule Follow-up Appointment: Yes Clinical Summary of Care: Electronic Signature(s) Signed: 08/26/2019 4:31:46 PM By: [name], [name], RN, CWS, [name], [name] Entered By: [name], [name], RN, CWS, [name] on 08/26/2019 13:11:46 [name] -------------------------------------------------------------------------------- Lower Extremity Assessment Details Patient Name: [name], [name]. Date of Service: 08/26/2019 12:45 PM Medical Record Number: [id number] Patient Account Number: [id number] Date of Birth/Sex: 1946/02/23 (73 y.o. F) Treating RN: [name] Primary Care [name]: [name] Other Clinician: Referring [name]: [name] Treating [name]/Extender: [name] in Treatment: 37 Edema Assessment Assessed: [Left: No] [Right: No] [Left: Edema] [Right: :] Calf Left: Right: Point of Measurement: 31 cm From Medial Instep 46 cm cm Ankle Left: Right: Point of Measurement: 12 cm From Medial Instep 22.5 cm cm Vascular Assessment Pulses: Dorsalis Pedis Palpable: [Left:Yes] Posterior Tibial Palpable: [Left:Yes] Electronic Signature(s) Signed: 08/26/2019 4:19:58 PM By: [name] Entered By: [name] on 08/26/2019 12:51:22 Espinola, [name] -------------------------------------------------------------------------------- Multi Wound Chart Details Patient Name: [name], [name]. Date of Service: 08/26/2019 12:45 PM Medical Record Number: [id number] Patient Account Number: [id number] Date of Birth/Sex: 07/22/1946 (73 y.o. F) Treating RN: [name] Primary Care Debar Plate: [name] Other Clinician: Referring [name]: [name] Treating [name]/Extender: [name]  in Treatment: 37 Vital Signs Height(in): 63 Pulse(bpm): 64 Weight(lbs): 224.7 Blood Pressure(mmHg): 143/77 Body Mass Index(BMI): 40 Temperature(F): 98.5 Respiratory Rate 18 (breaths/min): Photos: Wound Location: Left Lower Leg - Medial Left Lower Leg - Lateral Left Lower Leg - Lateral, Posterior Wounding Event: Gradually Appeared Gradually Appeared Gradually Appeared Primary Etiology: Lymphedema Venous Leg Ulcer Venous Leg Ulcer Comorbid History: Cataracts, Asthma, Sleep Cataracts, Asthma, Sleep Cataracts, Asthma, Sleep Apnea, Deep Vein Apnea, Deep Vein Apnea, Deep Vein Thrombosis, Hypertension, Thrombosis, Hypertension, Thrombosis, Hypertension, Peripheral Venous Disease, Peripheral Venous Disease, Peripheral Venous Disease, Osteoarthritis, Received Osteoarthritis, Received Osteoarthritis, Received Chemotherapy, Received Chemotherapy, Received  Chemotherapy, Received Radiation Radiation Radiation Date Acquired: 11/19/2018 01/19/2019 07/08/2019 Weeks of Treatment: 37 31 7 Wound Status: Open Open Open Measurements L x W x D 2.5x2.4x0.2 2.9x3.1x0.2 0.7x2x0.1 (cm) Area (cm) : 4.712 7.061 1.1 Volume (cm) : 0.942 1.412 0.11 % Reduction in Area: 44.90% -3109.50% -94.70% % Reduction in Volume: -10.20% -6318.20% -93.00% Classification: Full Thickness Without Full Thickness Without Full Thickness Without Exposed Support Structures Exposed Support Structures Exposed Support Structures Exudate Amount: Large Large Large Exudate Type: Serosanguineous Serosanguineous Serosanguineous Exudate Color: red, brown red, brown red, brown Wound Margin: Flat and Intact Flat and Intact Flat and Intact Granulation Amount: Small (1-33%) Small (1-33%) Large (67-100%) Granulation Quality: Pale Pink Pink Necrotic Amount: Large (67-100%) Large (67-100%) Small (1-33%) [name], [name] ([id number]) Exposed Structures: Fat Layer (Subcutaneous Fat Layer (Subcutaneous Fat Layer (Subcutaneous Tissue) Exposed:  Yes Tissue) Exposed: Yes Tissue) Exposed: Yes Fascia: No Fascia: No Fascia: No Tendon: No Tendon: No Tendon: No Muscle: No Muscle: No Muscle: No Joint: No Joint: No Joint: No Bone: No Bone: No Bone: No Epithelialization: None None None Debridement: Debridement - Excisional N/A N/A Pre-procedure 13:05 N/A N/A Verification/Time Out Taken: Pain Control: Lidocaine N/A N/A Tissue Debrided: Subcutaneous, Slough N/A N/A Level: Skin/Subcutaneous Tissue N/A N/A Debridement Area (sq cm): 6 N/A N/A Instrument: Curette N/A N/A Bleeding: Moderate N/A N/A Hemostasis Achieved: Pressure N/A N/A Debridement Treatment Procedure was tolerated well N/A N/A Response: Post Debridement 2.5x2.4x0.3 N/A N/A Measurements L x W x D (cm) Post Debridement Volume: 1.414 N/A N/A (cm) Procedures Performed: Debridement N/A N/A Treatment Notes Wound #5 (Left, Medial Lower Leg) Notes TCA, silvercel, drawtex, abd, 3 layer wrap with unna to anchor Wound #6 (Left, Lateral Lower Leg) Notes TCA, silvercel, drawtex, abd, 3 layer wrap with unna to anchor Wound #9 (Left, Lateral, Posterior Lower Leg) Notes TCA, silvercel, drawtex, abd, 3 layer wrap with unna to anchor Electronic Signature(s) Signed: 08/26/2019 5:06:38 PM By: Linton Ham MD Entered By: Linton Ham on 08/26/2019 13:16:51 Schuneman, [name] -------------------------------------------------------------------------------- Multi-Disciplinary Care Plan Details Patient Name: [name], [name]. Date of Service: 08/26/2019 12:45 PM Medical Record Number: [id number] Patient Account Number: [id number] Date of Birth/Sex: 23-Jul-1946 (73 y.o. F) Treating RN: [name] Primary Care Sanaia Jasso: [name] Other Clinician: Referring Miangel Flom: [name] Treating Thecla Forgione/Extender: [name] in Treatment: 67 Active Inactive Orientation to the Wound Care Program Nursing Diagnoses: Knowledge deficit related  to the wound healing center program Goals: Patient/caregiver will verbalize understanding of the [place] Program Date Initiated: 12/10/2018 Target Resolution Date: 01/09/2019 Goal Status: Active Interventions: Provide education on orientation to the wound center Notes: Soft Tissue Infection Nursing Diagnoses: Impaired tissue integrity Goals: Patient's soft tissue infection will resolve Date Initiated: 12/10/2018 Target Resolution Date: 01/09/2019 Goal Status: Active Interventions: Assess signs and symptoms of infection every visit Notes: Venous Leg Ulcer Nursing Diagnoses: Actual venous Insuffiency (use after diagnosis is confirmed) Goals: Patient will maintain optimal edema control Date Initiated: 12/10/2018 Target Resolution Date: 01/09/2019 Goal Status: Active Interventions: Assess peripheral edema status every visit. AZALEYA, STROUSS ([id number]) Treatment Activities: Therapeutic compression applied : 12/10/2018 Notes: Wound/Skin Impairment Nursing Diagnoses: Impaired tissue integrity Goals: Patient/caregiver will verbalize understanding of skin care regimen Date Initiated: 12/10/2018 Target Resolution Date: 01/09/2019 Goal Status: Active Interventions: Assess ulceration(s) every visit Treatment Activities: Topical wound management initiated : 12/10/2018 Notes: Electronic Signature(s) Signed: 08/26/2019 4:31:46 PM By: [name], [name], RN, CWS, [name], [name] Entered By: [name], [name], RN, CWS, [name] on 08/26/2019 13:04:15 Casso,  [name] -------------------------------------------------------------------------------- Pain Assessment Details Patient Name: [name], [name]. Date of Service: 08/26/2019 12:45 PM Medical Record Number: [id number] Patient Account Number: [id number] Date of Birth/Sex: 09-27-1946 (73 y.o. F) Treating RN: [name] Primary Care Aristidis [name]: [name] Other Clinician: Referring Tanyiah Laurich: [name] Treating  Aleyah Balik/Extender: [name] in Treatment: 37 Active Problems Location of Pain Severity and Description of Pain Patient Has Paino Yes Site Locations Rate the pain. Current Pain Level: 4 Pain Management and Medication Current Pain Management: Electronic Signature(s) Signed: 08/26/2019 4:19:58 PM By: [name] Entered By: [name] on 08/26/2019 12:50:01 [name] -------------------------------------------------------------------------------- Patient/Caregiver Education Details Patient Name: AVIANNAH, BOY. Date of Service: 08/26/2019 12:45 PM Medical Record Number: [id number] Patient Account Number: [id number] Date of Birth/Gender: 1946/03/09 (73 y.o. F) Treating RN: [name] Primary Care Physician: [name] Other Clinician: Referring Physician: Ria [name] Treating Physician/Extender: [name] in Treatment: 35 Education Assessment Education Provided To: Patient Education Topics Provided Welcome To [facility]: Wound/Skin Impairment: Handouts: Caring for Your Ulcer Methods: Demonstration, Explain/Verbal Responses: Return demonstration correctly, State content correctly Electronic Signature(s) Signed: 08/26/2019 4:31:46 PM By: [name], [name], RN, CWS, [name], [name] Entered By: [name], [name], RN, CWS, [name] on 08/26/2019 13:10:41 [name] -------------------------------------------------------------------------------- Wound Assessment Details Patient Name: [name], [name]. Date of Service: 08/26/2019 12:45 PM Medical Record Number: [id number] Patient Account Number: [id number] Date of Birth/Sex: [birth year] (73 y.o. F) Treating RN: [name] Primary Care Alia Parsley: [name] Other Clinician: Referring Tenille Morrill: [name] Treating Dorean Daniello/Extender: [name] in Treatment: 37 Wound Status Wound Number: 5 Primary Lymphedema Etiology: Wound  Location: Left Lower Leg - Medial Wound Open Wounding Event: Gradually Appeared Status: Date Acquired: 11/19/2018 Comorbid Cataracts, Asthma, Sleep Apnea, Deep Vein Weeks Of Treatment: 37 History: Thrombosis, Hypertension, Peripheral Venous Clustered Wound: No Disease, Osteoarthritis, Received Chemotherapy, Received Radiation Photos Wound Measurements Length: (cm) 2.5 Width: (cm) 2.4 Depth: (cm) 0.2 Area: (cm) 4.712 Volume: (cm) 0.942 % Reduction in Area: 44.9% % Reduction in Volume: -10.2% Epithelialization: None Tunneling: No Undermining: No Wound Description Full Thickness Without Exposed Support Classification: Structures Wound Margin: Flat and Intact Exudate Large Amount: Exudate Type: Serosanguineous Exudate Color: red, brown Foul Odor After Cleansing: No Slough/Fibrino Yes Wound Bed Granulation Amount: Small (1-33%) Exposed Structure Granulation Quality: Pale Fascia Exposed: No Necrotic Amount: Large (67-100%) Fat Layer (Subcutaneous Tissue) Exposed: Yes Necrotic Quality: Adherent Slough Tendon Exposed: No Muscle Exposed: No Joint Exposed: No Bone Exposed: No Ghanem, Imara J. ([id number]) Treatment Notes Wound #5 (Left, Medial Lower Leg) Notes TCA, silvercel, drawtex, abd, 3 layer wrap with unna to anchor Electronic Signature(s) Signed: 08/26/2019 4:19:58 PM By: [name] Entered By: [name] on 08/26/2019 12:57:35 Colden, [name] -------------------------------------------------------------------------------- Wound Assessment Details Patient Name: [name], [name]. Date of Service: 08/26/2019 12:45 PM Medical Record Number: [id number] Patient Account Number: [id number] Date of Birth/Sex: Aug 03, 1946 (73 y.o. F) Treating RN: [name] Primary Care Keondre Markson: [name] Other Clinician: Referring Evellyn Tuff: [name] Treating Delany Steury/Extender: [name] in Treatment: 37 Wound Status Wound  Number: 6 Primary Venous Leg Ulcer Etiology: Wound Location: Left Lower Leg - Lateral Wound Open Wounding Event: Gradually Appeared Status: Date Acquired: 01/19/2019 Comorbid Cataracts, Asthma, Sleep Apnea, Deep Vein Weeks Of Treatment: 31 History: Thrombosis, Hypertension, Peripheral Venous Clustered Wound: No Disease, Osteoarthritis, Received Chemotherapy, Received Radiation Photos Wound Measurements Length: (cm) 2.9 Width: (cm) 3.1 Depth: (cm) 0.2 Area: (cm) 7.061 Volume: (cm)  1.412 % Reduction in Area: -3109.5% % Reduction in Volume: -6318.2% Epithelialization: None Tunneling: No Undermining: No Wound Description Full Thickness Without Exposed Support Classification: Structures Wound Margin: Flat and Intact Exudate Large Amount: Exudate Type: Serosanguineous Exudate Color: red, brown Foul Odor After Cleansing: No Slough/Fibrino Yes Wound Bed Granulation Amount: Small (1-33%) Exposed Structure Granulation Quality: Pink Fascia Exposed: No Necrotic Amount: Large (67-100%) Fat Layer (Subcutaneous Tissue) Exposed: Yes Necrotic Quality: Adherent Slough Tendon Exposed: No Muscle Exposed: No Joint Exposed: No Bone Exposed: No Garate, Andrea J. ([id number]) Treatment Notes Wound #6 (Left, Lateral Lower Leg) Notes TCA, silvercel, drawtex, abd, 3 layer wrap with unna to anchor Electronic Signature(s) Signed: 08/26/2019 4:19:58 PM By: [name] Entered By: [name] on 08/26/2019 12:58:01 Waltner, [name] -------------------------------------------------------------------------------- Wound Assessment Details Patient Name: [name], [name]. Date of Service: 08/26/2019 12:45 PM Medical Record Number: [id number] Patient Account Number: [id number] Date of Birth/Sex: April 23, 1946 (73 y.o. F) Treating RN: [name] Primary Care Cyndy Braver: [name] Other Clinician: Referring Argyle Gustafson: [name] Treating Azadeh Hyder/Extender:  [name] in Treatment: 37 Wound Status Wound Number: 9 Primary Venous Leg Ulcer Etiology: Wound Location: Left Lower Leg - Lateral, Posterior Wound Open Wounding Event: Gradually Appeared Status: Date Acquired: 07/08/2019 Comorbid Cataracts, Asthma, Sleep Apnea, Deep Vein Weeks Of Treatment: 7 History: Thrombosis, Hypertension, Peripheral Venous Clustered Wound: No Disease, Osteoarthritis, Received Chemotherapy, Received Radiation Photos Wound Measurements Length: (cm) 0.7 Width: (cm) 2 Depth: (cm) 0.1 Area: (cm) 1.1 Volume: (cm) 0.11 % Reduction in Area: -94.7% % Reduction in Volume: -93% Epithelialization: None Tunneling: No Undermining: No Wound Description Full Thickness Without Exposed Support Classification: Structures Wound Margin: Flat and Intact Exudate Large Amount: Exudate Type: Serosanguineous Exudate Color: red, brown Foul Odor After Cleansing: No Slough/Fibrino Yes Wound Bed Granulation Amount: Large (67-100%) Exposed Structure Granulation Quality: Pink Fascia Exposed: No Necrotic Amount: Small (1-33%) Fat Layer (Subcutaneous Tissue) Exposed: Yes Necrotic Quality: Adherent Slough Tendon Exposed: No Muscle Exposed: No Joint Exposed: No Bone Exposed: No Cannan, Marykathleen J. ([id number]) Treatment Notes Wound #9 (Left, Lateral, Posterior Lower Leg) Notes TCA, silvercel, drawtex, abd, 3 layer wrap with unna to anchor Electronic Signature(s) Signed: 08/26/2019 4:19:58 PM By: [name] Entered By: [name] on 08/26/2019 12:58:34 Bethards, [name] -------------------------------------------------------------------------------- Vitals Details Patient Name: [name], ARK. Date of Service: 08/26/2019 12:45 PM Medical Record Number: [id number] Patient Account Number: [id number] Date of Birth/Sex: 04-Jul-1946 (73 y.o. F) Treating RN: [name] Primary Care Maiana Hennigan: [name] Other Clinician: Referring  Tyrann Donaho: [name] Treating Emiliana Blaize/Extender: [name] in Treatment: 37 Vital Signs Time Taken: 12:50 Temperature (F): 98.5 Height (in): 63 Pulse (bpm): 91 Weight (lbs): 224.7 Respiratory Rate (breaths/min): 18 Body Mass Index (BMI): 39.8 Blood Pressure (mmHg): 143/77 Reference Range: 80 - 120 mg / dl Electronic Signature(s) Signed: 08/26/2019 4:19:58 PM By: [name] Entered By: [name] on 08/26/2019 12:50:46

## 2019-08-27 NOTE — Progress Notes (Signed)
BARBETTA, FERONE ([id number]) Visit Report for 08/26/2019 Debridement Details Patient Name: [name], BALA. Date of Service: 08/26/2019 12:45 PM Medical Record Number: [id number] Patient Account Number: [id number] Date of Birth/Sex: September 27, 1946 (73 y.o. F) Treating RN: Cornell Barman Primary Care Provider: [name] Other Clinician: Referring Provider: [name] Treating Provider/Extender: Tito Dine in Treatment: 37 Debridement Performed for Wound #5 Left,Medial Lower Leg Assessment: Performed By: Physician [name] Dillon, MD Debridement Type: Debridement Level of Consciousness (Pre- Awake and Alert procedure): Pre-procedure Verification/Time Yes - 13:05 Out Taken: Start Time: 13:05 Pain Control: Lidocaine Total Area Debrided (L x W): 2.5 (cm) x 2.4 (cm) = 6 (cm) Tissue and other material Viable, Slough, Subcutaneous, Slough debrided: Level: Skin/Subcutaneous Tissue Debridement Description: Excisional Instrument: Curette Bleeding: Moderate Hemostasis Achieved: Pressure End Time: 13:08 Response to Treatment: Procedure was tolerated well Level of Consciousness Awake and Alert (Post-procedure): Post Debridement Measurements of Total Wound Length: (cm) 2.5 Width: (cm) 2.4 Depth: (cm) 0.3 Volume: (cm) 1.414 Character of Wound/Ulcer Post Debridement: Stable Post Procedure Diagnosis Same as Pre-procedure Electronic Signature(s) Signed: 08/26/2019 4:31:46 PM By: [name], [name], RN, CWS, [name], [name] Signed: 08/26/2019 5:06:38 PM By: Linton Ham MD Entered By: Linton Ham on 08/26/2019 13:17:03 Emrich, [name] -------------------------------------------------------------------------------- HPI Details Patient Name: [name], BURCHARDT. Date of Service: 08/26/2019 12:45 PM Medical Record Number: [id number] Patient Account Number: [id number] Date of Birth/Sex: [birth year] (73 y.o. F) Treating RN: Cornell Barman Primary Care Provider:  [name] Other Clinician: Referring Provider: [name] Treating Provider/Extender: Tito Dine in Treatment: 38 History of Present Illness HPI Description: Pleasant 73 year old with history of chronic venous insufficiency. No diabetes or peripheral vascular disease. Left ABI 1.29. Questionable history of left lower extremity DVT. She developed a recurrent ulceration on her left lateral calf in December 2015, which she attributes to poor diet and subsequent lower extremity edema. She underwent endovenous laser ablation of her left greater saphenous vein in 2010. She underwent laser ablation of accessory branch of left GSV in April 2016 by Dr. Kellie Simmering at [facility]. She was previously wearing Unna boots, which she tolerated well. Tolerating 2 layer compression and cadexomer iodine. She returns to clinic for follow-up and is without new complaints. She denies any significant pain at this time. She reports persistent pain with pressure. No claudication or ischemic rest pain. No fever or chills. No drainage. READMISSION 11/13/16; this is a 73 year old woman who is not a diabetic. She is here for a review of a painful area on her left medial lower extremity. I note that she was seen here previously last year for wound I believe to be in the same area. At that time she had undergone previously a left greater saphenous vein ablation by Dr. Kellie Simmering and she had a ablation of the anterior accessory branch of the left greater saphenous vein in March 2016. Seeing that the wound actually closed over. In reviewing the history with her today the ulcer in this area has been recurrent. She describes a biopsy of this area in 2009 that only showed stasis physiology. 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 Do not see that she has ever had arterial studies in the left leg. She tells me she has a follow-up with Dr.  Kellie Simmering in roughly 10 days In any case she developed the reopening of this area roughly a month ago. On the background of this she describes rapidly increasing edema which has responded to Lasix 40 mg and metolazone 2.5 mg as well as the patient's lymph massage. She has been told she has both venous insufficiency and lymphedema but she cannot tolerate compression stockings 11/28/16; the patient saw Dr. Kellie Simmering recently. Per the patient he did arterial Dopplers in the office that did not show evidence of arterial insufficiency, per the patient he stated "treat this like an ordinary venous ulcer". She also saw her dermatologist Dr. Ronnald Ramp who felt that this was more of a vascular ulcer. In general things are improving although she arrives today with increasing bilateral lower extremity edema with weeping a deeper fluid through the wound on the left medial leg compatible with some degree of lymphedema 12/04/16; the patient's wound is fully epithelialized but I don't think fully healed. We will do another week of depression with Promogran and TCA however I suspect we'll be able to discharge her next week. This is a very unusual-looking wound which was initially a figure-of-eight type wound lying on its side surrounded by petechial like hemorrhage. She has had venous ablation on this side. She apparently does not have an arterial issue per Dr. Kellie Simmering. She saw her dermatologist thought it was "vascular". Patient is definitely going to need ongoing compression and I talked about this with her today she will go to elastic therapy after she leaves here next week 12/11/16; the patient's wound is not completely closed today. She has surrounding scar tissue and in further discussion with the patient it would appear that she had ulcers in this area in 2009 for a  prolonged period of time ultimately requiring a punch biopsy of this area that only showed venous insufficiency. I did not previously pickup on this part of the history from the patient. 12/18/16; the patient's wound is completely epithelialized. There is no open area here. She has significant bilateral venous insufficiency with secondary lymphedema to a mild-to-moderate degree she does not have compression stockings.. She did not say anything to me when I was in the room, she told our intake nurse that she was still having pain in this area. This isn't unusual recurrent small open area. She is going to go to elastic therapy to obtain compression stockings. 12/25/16; the patient's wound is fully epithelialized. There is no open area here. The patient describes some continued episodic discomfort in this area medial left calf. However everything looks fine and healed here. She is been to elastic therapy and caught herself 15-20 mmHg stockings, they apparently were having trouble getting 20-30 mm stockings in her size AVALENA, GUSTUS ([id number]) 01/22/17; this is a patient we discharged from the clinic a month ago. She has a recurrent open wound on her medial left calf. She had 15 mm support stockings. I told her I thought she needed 20-30 mm compression stockings. She tells me that she has been ill with hospitalization secondary to asthma and is been found to have severe hypokalemia likely secondary to a combination of Lasix and metolazone. This morning she noted blistering and leaking fluid on the posterior part of her left leg. She called our intake nurse urgently and we was saw her this afternoon. She has not had any real discomfort here. I don't know that she's been wearing any stockings on this leg for at least 2-3 days. ABIs in this clinic were  1.21 on the right and 1.3 on the left. She is previously seen vascular surgery who does not think that there is a peripheral arterial issue. 01/30/17;  Patient arrives with no open wound on the left leg. She has been to elastic therapy and obtained 20-10mmhg below knee stockings and she has one on the right leg today. READMISSION 02/19/18; this Alamin is a now 73 year old patient we've had in this clinic perhaps 3 times before. I had last looked at her from January 07 December 2016 with an area on the medial left leg. We discharged her on 12/25/16 however she had to be readmitted on 01/22/17 with a recurrence. I have in my notes that we discharged her on 20-30 mm stockings although she tells me she was only wearing support hose because she cannot get stockings on predominantly related to her cervical spine surgery/issues. She has had previous ablations done by vein and vascular in [place] including a great saphenous vein ablation on the left with an anterior accessory branch ablation I think both of these were in 2016. On one of the previous visit she had a biopsy noted 2009 that was negative. She is not felt to have an arterial issue. She is not a diabetic. She does have a history of obstructive sleep apnea hypertension asthma as well as chronic venous insufficiency and lymphedema. On this occasion she noted 2 dry scaly patch on her left leg. She tried to put lotion on this it didn't really help. There were 2 open areas.the patient has been seeing her primary physician from 02/05/18 through 02/14/18. She had Unna boots applied. The superior wound now on the lateral left leg has closed but she's had one wound that remains open on the lateral left leg. This is not the same spot as we dealt with in 2018. ABIs in this clinic were 1.3 bilaterally 02/26/18; patient has a small wound on the left lateral calf. Dimensions are down. She has chronic venous insufficiency and lymphedema. 03/05/18; small open area on the left lateral calf. Dimensions are down. Tightly adherent necrotic debris over the surface of the wound which was difficult to remove. Also the  dressing [over collagen] stuck to the wound surface. This was removed with some difficulty as well. Change the primary dressing to Hydrofera Blue ready 03/12/18; small open area on the left lateral calf. Comes in with tightly adherent surface eschar as well as some adherent Hydrofera Blue. 03/19/18; open area on the left lateral calf. Again adherent surface eschar as well as some adherent Hydrofera Blue nonviable subcutaneous tissue. She complained of pain all week even with the reduction from 4-3 layer compression I put on last week. Also she had an increase in her ankle and calf measurements probably related to the same thing. 03/26/18; open area on the left lateral calf. A very small open area remains here. We used silver alginate starting last week as the Hydrofera Blue seem to stick to the wound bed. In using 4-layer compression 04/02/18; the open area in the left lateral calf at some adherent slough which I removed there is no open area here. We are able to transition her into her own compression stocking. Truthfully I think this is probably his support hose. However this does not maintain skin integrity will be limited. She cannot put over the toe compression stockings on because of neck problems hand problems etc. She is allergic to the lining layer of juxta lites. We might be forced to use extremitease stocking should this  fail READMIT 11/24/2018 Patient is now a 73 year old woman who is not a diabetic. She has been in this clinic on at least 3 previous occasions largely with recurrent wounds on her left leg secondary to chronic venous insufficiency with secondary lymphedema. Her situation is complicated by inability to get stockings on and an allergy to neoprene which is apparently a component and at least juxta lites and other stockings. As a result she really has not been wearing any stockings on her legs. She tells [place] that roughly 2 or 3 weeks ago she started noticing a stinging sensation  just above her ankle on the left medial aspect. She has been diagnosed with pseudogout and she wondered whether this was what she was experiencing. She tried to dress this with something she bought at the store however subsequently it pulled skin off and now she has an open wound that is not improving. She has been using Vaseline gauze with a cover bandage. She saw her primary doctor last week who put an Haematologist on her. ABIs in this clinic was 1.03 on the left TORILYN, BRAD. ([id number]) 2/12; the area is on the left medial ankle. Odd-looking wound with what looks to be surface epithelialization but a multitude of small petechial openings. This clearly not closed yet. We have been using silver alginate under 3 layer compression with TCA 2/19; the wound area did not look quite as good this week. Necrotic debris over the majority of the wound surface which required debridement. She continues to have a multitude of what looked to be small petechial openings. She reminds [place] that she had a biopsy on this initially during her first outbreak in 2015 in [place] dermatology. She expresses concern about this being a possible melanoma. 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 I assured her that this does not look anything like melanoma. Except for the petechial reaction it does look like a venous insufficiency area and she certainly has evidence of this on both sides 2/26; a difficult area on the left medial ankle. The patient clearly has chronic venous hypertension with some degree of lymphedema. The odd thing about the area is the small petechial hemorrhages. I am not really sure how to explain this. This was present last time and this is not a compression injury. We have been using Hydrofera Blue which I changed to last week 3/4; still using Hydrofera Blue. Aggressive debridement today. She does not have known  arterial issues. She has seen Dr. Kellie Simmering at [facility] vein and vascular and and has an ablation on the left. [Anterior accessory branch of the greater saphenous]. From what I remember they did not feel she had an arterial issue. The patient has had this area biopsied in 2009 at [facility] dermatology and by her recollection they said this was "stasis". She is also follow-up with dermatology locally who thought that this was more of a vascular issue 3/11; using Hydrofera Blue. Aggressive debridement today. She does not have an arterial issue. We are using 3 layer compression although we may need to go to 4. The patient has been in for multiple changes to her wrap since I last saw her a week ago. She says that the area was leaking. I do not have too much more information on what was found 01/19/19 on evaluation today patient was actually being seen for a nurse visit when unfortunately she had the area on her  left lateral lower extremity as well as weeping from the right lower extremity that became apparent. Therefore we did end up actually seeing her for a full visit with myself. She is having some pain at this site as well but fortunately nothing too significant at this point. No fevers, chills, nausea, or vomiting noted at this time. 3/18-Patient is back to the clinic with the left leg venous leg ulcer, the ulcer is larger in size, has a surface that is densely adherent with fibrinous tissue, the Hydrofera Blue was used but is densely adherent and there was difficulty in removing it. The right lower extremity was also wrapped for weeping edema. Patient has a new area over the left lateral foot above the malleolus that is small and appears to have no debris with intact surrounding skin. Patient is on increased dose of Lasix also as a means to edema management 3/25; the patient has a nonhealing venous ulcer on the medial left leg and last week developed a smaller area on the lateral left calf. We have  been using Hydrofera Blue with a contact layer. 4/1; no major change in these wounds areas. Left medial and more recently left lateral calf. I tried Iodoflex last week to aid in debridement she did not tolerate this. She stated her pain was terrible all week. She took the top layer of the 4 layer compression off. 4/8; the patient actually looks somewhat better in terms of her more prominent left lateral calf wound. There is some healthy looking tissue here. She is still complaining of a lot of discomfort. 4/15; patient in a lot of pain secondary to sciatica. She is on a prednisone taper prescribed by her primary physician. She has the 2 areas one on the left medial and more recently a smaller area on the left lateral calf. Both of these just above the malleoli 4/22; her back pain is better but she still states she is very uncomfortable and now feels she is intolerant to the [employer]. No real change in the wounds we have been using Sorbact. She has been previously intolerant to Iodoflex. There is not a lot of option about what we can use to debride this wound under compression that she no doubt needs. sHe states Ultram no longer works for her pain 4/29; no major change in the wounds slightly increased depth. Surface on the original medial wound perhaps somewhat improved however the more recent area on the lateral left ankle is 100% covered in very adherent debris we have been using Sorbact. She tolerates 4 layer compression well and her edema control is a lot better. She has not had to come in for a nurse check 5/6; no major change in the condition of the wounds. She did consent to debridement today which was done with some difficulty. Continuing Sorbact. She did not tolerate Iodoflex. She was in for a check of her compression the day after we wrapped her last week this was adjusted but nothing much was found 5/13; no major change in the condition or area of the wounds. I was able to get a fairly  aggressive debridement done on the lateral left leg wound. Even using Sorbact under compression. She came back in on Friday to have the wrap changed. She says she felt uncomfortable on the lateral aspect of her ankle. She has a long history of chronic venous insufficiency including previous ablation surgery on this side. 5/20-Patient returns for wounds on left leg with both wounds covered in slough,  with the lateral leg wound larger in size, she has been in 3 layer compression and felt more comfortable, she describes pain in ankle, in leg and pins and needles in foot, and is about to try Pamelor for this 6/3; wounds on the left lateral and left medial leg. The area medially which is the most recent of the 2 seems to have had the largest increase in dimensions. We have been using Sorbac to try and debride the surface. She has been to see orthopedics they apparently did a plain x-ray that was indeterminant. Diagnosed her with neuropathy and they have ordered an MRI to ANJANETTE, RIPPEY. ([id number]) determine if there is underlying osteomyelitis. This was not high on my thought list but I suppose it is prudent. We have advised her to make an appointment with vein and vascular in [place]. She has a history of a left greater saphenous and accessory vein ablations I wonder if there is anything else that can be done from a surgical point of view to help in these difficult refractory wounds. We have previously healed this wound on one occasion but it keeps on reopening [medial side] 6/10; deep tissue culture I did last week I think on the left medial wound showed both moderate E. coli and moderate staph aureus [MSSA]. She is going to require antibiotics and I have chosen Augmentin. We have been using Sorbact and we have made better looking wound surface on both sides but certainly no improvement in wound area. She was back in last Friday apparently for a dressing changes the wrap was hurting her outer  left ankle. She has not managed to get a hold of vein and vascular in [place]. We are going to have to make her that appointment 6/17; patient is tolerating the Augmentin. She had an MRI that I think was ordered by orthopedic surgeon this did not show osteomyelitis or an abscess did suggest cellulitis. We have been using Sorbact to the lateral and medial ankles. We have been trying to arrange a follow-up appointment with vein and vascular in [place] or did her original ablations. We apparently an area sent the request to vein and vascular in [facility] 6/24; patient has completed the Augmentin. We do not yet have a vein and vascular appointment in [place]. I am not sure what the issue is here we have asked her to call tomorrow. We are using Sorbact. Making some improvements and especially the medial wound. Both surfaces however look better medial and lateral. 7/1; the patient has been in contact with vein and vascular in [place] but has not yet received an appointment. Using Sorbact we have gradually improve the wound surface with no improvement in surface area. She is approved for Apligraf but the wound surface still is not completely viable. She has not had to come in for a dressing change 7/8; the patient has an appointment with vein and vascular on 7/31 which is a Friday afternoon. She is concerned about getting back here for [place] to dress her wounds. I think it is important to have them goal for her venous reflux/history of ablations etc. to see if anything else can be done. She apparently tested positive for 1 of the blood tests with regards to lupus and saw a rheumatologist. He has raised the issue of vasculitis again. I have had this thought in the past however the evidence seems overwhelming that this is a venous reflux etiology. If the rheumatologist tells me there is clinical and laboratory investigation is  positive for lupus I will rethink this. 7/15; the patient's wound  surfaces are quite a bit better. The medial area which was her original wound now has no depth although the lateral wound which was the more recent area actually appears larger. Both with viable surfaces which is indeed better. Using Sorbact. I wanted to use Apligraf on her however there is the issue of the vein and vascular appointment on 7/31 at 2:00 in the afternoon which would not allow her to get back to be rewrapped and they would no doubt remove the graft 7/22; the patient's wound surfaces have moderate amount of debris although generally look better. The lateral one is larger with 2 small satellite areas superiorly. We are waiting for her vein and vascular appointment on 7/31. She has been approved for Apligraf which I would like to use after th 7/29; wound surfaces have improved no debridement is required we have been using Sorbact. She sees vein and vascular on Friday with this so question of whether anything can be done to lessen the likelihood of recurrence and/or speed the healing of these areas. She is already had previous ablations. She no doubt has severe venous hypertension 8/5-Patient returns at 1 week, she was in [place] for 3 days by her podiatrist, we have been using so backed to the wound, she has increased pain in both the wounds on the left lower leg especially the more distal one on the lateral aspect 8/12-Patient returns at 1 week and she is agreeable to having debridement in both wounds on her left leg today. We have been using Sorbact, and vascular studies were reviewed at last visit 8/19; the patient arrives with her wounds fairly clean and no debridement is required. We have used Sorbact which is really done a nice job in cleaning up these very difficult wound surfaces. The patient saw Dr. Donzetta Matters of vascular surgery on 7/31. He did not feel that there was an arterial component. He felt that her treated greater saphenous vein is adequately addressed and that the small  saphenous vein did not appear to be involved significantly. She was also noted to have deep venous reflux which is not treatable. Dr. Donzetta Matters mentioned the possibility of a central obstructive component leading to reflux and he offered her central venography. She wanted to discuss this or think about it. I have urged her to go ahead with this. She has had recurrent difficult wounds in these areas which do heal but after months in the clinic. If there is anything that can be done to reduce the likelihood of this I think it is worth it. 9/2 she is still working towards getting follow-up with Dr. Donzetta Matters to schedule her CT. Things are quite a bit worse venography. I put Apligraf on 2 weeks ago on both wounds on the medial and lateral part of her left lower leg. She arrives in clinic today with 3 superficial additional wounds above the area laterally and one below the wound medially. She describes a lot of discomfort. I think these are probably wrapped injuries. Does not look like she has cellulitis. 07/20/2019 on evaluation today patient appears to be doing somewhat poorly in regard to her lower extremity ulcers. She in fact showed signs of erythema in fact we may even be dealing with an infection at this time. Unfortunately I am unsure if this is just infection or if indeed there may be some allergic reaction that occurred as a result of the Apligraf application. With that  being said that would be unusual but nonetheless not impossible in this patient is one who is unfortunately allergic to quite a bit. Currently we have been using the Sorbact which seems to do as well as anything for her. I do think we may want to obtain a culture today to see if there is anything showing up there that may need to be addressed. AARALYN, [name] 9/16; noted that last week the wounds look worse in 1 week follow-up of the Apligraf. Using Sorbact as of 2 days ago. She arrives with copious amounts of drainage and new  skin breakdown on the back of the left calf. The wounds arm more substantial bilaterally. There is a fair amount of swelling in the left calf no overt DVT there is edema present I think in the left greater than right thigh. She is supposed to go on 9/28 for CT venography. The wounds on the medial and lateral calf are worse and she has new skin breakdown posteriorly at least new for me. This is almost developing into a circumferential wound area The Apligraf was taken off last week which I agree with things are not going in the right direction a culture was done we do not have that back yet. She is on Augmentin that she started 2 days ago 9/23; dressing was changed by her nurses on Monday. In general there is no improvement in the wound areas although the area looks less angry than last week. She did get Augmentin for MSSA cultured on the 14th. She still appears to have too much swelling in the left leg even with 3 layer compression 9/30; the patient underwent her procedure on 9/28 by Dr. Donzetta [facility]. She was discovered to have the common iliac vein measuring 12.2 mm but at the level of L4-L5 measured 3 mm. After stenting it measured 10 mm. 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 Rouleaux flow in the common femoral and femoral vein was observed much improved after stenting. We are using silver alginate to the wounds on the medial and lateral ankle on the left. 4 layer compression 10/7; the patient had fluid swelling around her knee and 4 layer compression. At the advice of vein and vascular this was reduced to 3 layer which she is tolerating better. We have been using silver alginate under 3 layer compression since last Friday 10/14; arrives with the areas on the left ankle looking a lot better. Inflammation in the area also a lot better. She came in for a nurse check on 10/9 10/21; continued nice improvement. Slight improvements in surface area of both  the medial and lateral wounds on the left. A lot of the satellite lesions in the weeping erythema around these from stasis dermatitis is resolved. We have been using silver alginate Electronic Signature(s) Signed: 08/26/2019 5:06:38 PM By: Linton Ham MD Entered By: Linton Ham on 08/26/2019 13:17:55 Lawyer, [name] -------------------------------------------------------------------------------- Physical Exam Details Patient Name: [name], [name]. Date of Service: 08/26/2019 12:45 PM Medical Record Number: [id number] Patient Account Number: [id number] Date of Birth/Sex: Sep 11, 1946 (73 y.o. F) Treating RN: Cornell Barman Primary Care Provider: [name] Other Clinician: Referring Provider: [name] Treating Provider/Extender: Tito Dine in Treatment: 37 Constitutional Sitting or standing Blood Pressure is within target range for patient.. Pulse regular and within target range for patient.Marland Kitchen Respirations regular, non-labored and within target range.. Temperature is normal and within the target range for the patient.Marland Kitchen appears  in no distress. Notes Wound exam; the 2 original areas on the left medial and left lateral ankle continue to have a better looking surface. Still requiring aggressive debridement with an open curette to remove tightly adherent fibrinous debris however these continue to look better and slightly smaller. The degree of erythema here from stasis dermatitis is a lot better. Most of the satellite lesions are closed Electronic Signature(s) Signed: 08/26/2019 5:06:38 PM By: Linton Ham MD Entered By: Linton Ham on 08/26/2019 13:19:35 Emory, [name] ([id number]) -------------------------------------------------------------------------------- Physician Orders Details Patient Name: JLYN, [name]. Date of Service: 08/26/2019 12:45 PM Medical Record Number: [id number] Patient Account Number: [id number] Date of Birth/Sex:  1946/05/18 (73 y.o. F) Treating RN: Cornell Barman Primary Care Provider: [name] Other Clinician: Referring Provider: [name] Treating Provider/Extender: Tito Dine in Treatment: 81 Verbal / Phone Orders: No Diagnosis Coding Wound Cleansing Wound #5 Left,Medial Lower Leg o Cleanse wound with mild soap and water Wound #6 Left,Lateral Lower Leg o Cleanse wound with mild soap and water Wound #9 Left,Lateral,Posterior Lower Leg o Cleanse wound with mild soap and water Anesthetic (add to Medication List) Wound #5 Left,Medial Lower Leg o Topical Lidocaine 4% cream applied to wound bed prior to debridement (In Clinic Only). Wound #6 Left,Lateral Lower Leg o Topical Lidocaine 4% cream applied to wound bed prior to debridement (In Clinic Only). Wound #9 Left,Lateral,Posterior Lower Leg o Topical Lidocaine 4% cream applied to wound bed prior to debridement (In Clinic Only). Skin Barriers/Peri-Wound Care Wound #5 Left,Medial Lower Leg o Triamcinolone Acetonide Ointment (TCA) Wound #6 Left,Lateral Lower Leg o Triamcinolone Acetonide Ointment (TCA) Wound #9 Left,Lateral,Posterior Lower Leg o Triamcinolone Acetonide Ointment (TCA) Primary Wound Dressing Wound #5 Left,Medial Lower Leg o Silver Alginate Wound #6 Left,Lateral Lower Leg o Silver Alginate Wound #9 Left,Lateral,Posterior Lower Leg o Silver Alginate Secondary Dressing Wound #5 Left,Medial Lower Leg o Drawtex SEBASTIANA, SHAFRAN. ([id number]) Wound #6 Left,Lateral Lower Leg o Drawtex Wound #9 Left,Lateral,Posterior Lower Leg o Drawtex Dressing Change Frequency Wound #5 Left,Medial Lower Leg o Change dressing every week o Other: - as needed. Wound #6 Left,Lateral Lower Leg o Change dressing every week o Other: - as needed. Wound #9 Left,Lateral,Posterior Lower Leg o Change dressing every week o Other: - as needed. Follow-up Appointments Wound #5  Left,Medial Lower Leg o Return Appointment in 1 week. o Nurse Visit as needed - Call of needed Wound #6 Left,Lateral Lower Leg o Return Appointment in 1 week. o Nurse Visit as needed - Call of needed Wound #9 Left,Lateral,Posterior Lower Leg o Return Appointment in 1 week. o Nurse Visit as needed - Call of needed Edema Control Wound #5 Left,Medial Lower Leg o 3 Layer Compression System - Left Lower Extremity Wound #6 Left,Lateral Lower Leg o 3 Layer Compression System - Left Lower Extremity Wound #9 Left,Lateral,Posterior Lower Leg o 3 Layer Compression System - Left Lower Extremity Electronic Signature(s) Signed: 08/26/2019 4:31:46 PM By: [name], [name], RN, CWS, [name], [name] Signed: 08/26/2019 5:06:38 PM By: Linton Ham MD Entered By: [name], [name], RN, CWS, Kim on 08/26/2019 13:10:23 BATYA, DECHRISTOPHER ([id number]) -------------------------------------------------------------------------------- Problem List Details Patient Name: JAHNEE, KUBIAK. Date of Service: 08/26/2019 12:45 PM Medical Record Number: [id number] Patient Account Number: [id number] Date of Birth/Sex: 1946/07/12 (73 y.o. F) Treating RN: Cornell Barman Primary Care Provider: [name] Other Clinician: Referring Provider: [name] Treating Provider/Extender: Tito Dine in Treatment: 68 Active Problems ICD-10 Evaluated Encounter Code Description Active Date Today Diagnosis L97.221  Non-pressure chronic ulcer of left calf limited to breakdown of 01/07/2019 No Yes skin I87.321 Chronic venous hypertension (idiopathic) with inflammation of 12/10/2018 No Yes right lower extremity I89.0 Lymphedema, not elsewhere classified 12/10/2018 No Yes Inactive Problems Resolved Problems Electronic Signature(s) Signed: 08/26/2019 5:06:38 PM By: Linton Ham MD Entered By: Linton Ham on 08/26/2019 13:16:42 Backs, [name]  ([id number]) -------------------------------------------------------------------------------- Progress Note Details Patient Name: [name], [name]. Date of Service: 08/26/2019 12:45 PM Medical Record Number: [id number] Patient Account Number: [id number] Date of Birth/Sex: 19-Apr-1946 (73 y.o. F) Treating RN: Cornell Barman Primary Care Provider: [name] Other Clinician: Referring Provider: [name] Treating Provider/Extender: Tito Dine in Treatment: 37 Subjective History of Present Illness (HPI) Pleasant 73 year old with history of chronic venous insufficiency. No diabetes or peripheral vascular disease. Left ABI 1.29. Questionable history of left lower extremity DVT. She developed a recurrent ulceration on her left lateral calf in December 2015, which she attributes to poor diet and subsequent lower extremity edema. She underwent endovenous laser ablation of her left greater saphenous vein in 2010. She underwent laser ablation of accessory branch of left GSV in April 2016 by Dr. Kellie Simmering at [facility]. She was previously wearing Unna boots, which she tolerated well. Tolerating 2 layer compression and cadexomer iodine. She returns to clinic for follow-up and is without new complaints. She denies any significant pain at this time. She reports persistent pain with pressure. No claudication or ischemic rest pain. No fever or chills. No drainage. READMISSION 11/13/16; this is a 73 year old woman who is not a diabetic. She is here for a review of a painful area on her left medial lower extremity. I note that she was seen here previously last year for wound I believe to be in the same area. At that time she had undergone previously a left greater saphenous vein ablation by Dr. Kellie Simmering and she had a ablation of the anterior accessory branch of the left greater saphenous vein in March 2016. Seeing that the wound actually closed over. In reviewing the history with her today the  ulcer in this area has been recurrent. She describes a biopsy of this area in 2009 that only showed stasis physiology. 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 Do not see that she has ever had arterial studies in the left leg. She tells me she has a follow-up with Dr. Kellie Simmering in roughly 10 days In any case she developed the reopening of this area roughly a month ago. On the background of this she describes rapidly increasing edema which has responded to Lasix 40 mg and metolazone 2.5 mg as well as the patient's lymph massage. She has been told she has both venous insufficiency and lymphedema but she cannot tolerate compression stockings 11/28/16; the patient saw Dr. Kellie Simmering recently. Per the patient he did arterial Dopplers in the office that did not show evidence of arterial insufficiency, per the patient he stated "treat this like an ordinary venous ulcer". She also saw her dermatologist Dr. Ronnald Ramp who felt that this was more of a vascular ulcer. In general things are improving although she arrives today with increasing bilateral lower extremity edema with weeping a deeper fluid through the wound on the left medial leg compatible with some degree of lymphedema 12/04/16; the  patient's wound is fully epithelialized but I don't think fully healed. We will do another week of depression with Promogran and TCA however I suspect we'll be able to discharge her next week. This is a very unusual-looking wound which was initially a figure-of-eight type wound lying on its side surrounded by petechial like hemorrhage. She has had venous ablation on this side. She apparently does not have an arterial issue per Dr. Kellie Simmering. She saw her dermatologist thought it was "vascular". Patient is definitely  going to need ongoing compression and I talked about this with her today she will go to elastic therapy after she leaves here next week 12/11/16; the patient's wound is not completely closed today. She has surrounding scar tissue and in further discussion with the patient it would appear that she had ulcers in this area in 2009 for a prolonged period of time ultimately requiring a punch biopsy of this area that only showed venous insufficiency. I did not previously pickup on this part of the history from the patient. 12/18/16; the patient's wound is completely epithelialized. There is no open area here. She has significant bilateral venous insufficiency with secondary lymphedema to a mild-to-moderate degree she does not have compression stockings.. She did not say anything to me when I was in the room, she told our intake nurse that she was still having pain in this area. This isn't unusual recurrent small open area. She is going to go to elastic therapy to obtain compression stockings. 12/25/16; the patient's wound is fully epithelialized. There is no open area here. The patient describes some continued episodic discomfort in this area medial left calf. However everything looks fine and healed here. She is been to elastic therapy and ILIYANA, SWEENY ([id number]) caught herself 15-20 mmHg stockings, they apparently were having trouble getting 20-30 mm stockings in her size 01/22/17; this is a patient we discharged from the clinic a month ago. She has a recurrent open wound on her medial left calf. She had 15 mm support stockings. I told her I thought she needed 20-30 mm compression stockings. She tells me that she has been ill with hospitalization secondary to asthma and is been found to have severe hypokalemia likely secondary to a combination of Lasix and metolazone. This morning she noted blistering and leaking fluid on the posterior part of her left leg. She called our intake nurse urgently and we  was saw her this afternoon. She has not had any real discomfort here. I don't know that she's been wearing any stockings on this leg for at least 2-3 days. ABIs in this clinic were 1.21 on the right and 1.3 on the left. She is previously seen vascular surgery who does not think that there is a peripheral arterial issue. 01/30/17; Patient arrives with no open wound on the left leg. She has been to elastic therapy and obtained 20-60mmhg below knee stockings and she has one on the right leg today. READMISSION 02/19/18; this Bowerman is a now 73 year old patient we've had in this clinic perhaps 3 times before. I had last looked at her from January 07 December 2016 with an area on the medial left leg. We discharged her on 12/25/16 however she had to be readmitted on 01/22/17 with a recurrence. I have in my notes that we discharged her on 20-30 mm stockings although she tells me she was only wearing support hose because she cannot get stockings on predominantly related to her cervical spine surgery/issues. She has had previous ablations done  by vein and vascular in [place] including a great saphenous vein ablation on the left with an anterior accessory branch ablation I think both of these were in 2016. On one of the previous visit she had a biopsy noted 2009 that was negative. She is not felt to have an arterial issue. She is not a diabetic. She does have a history of obstructive sleep apnea hypertension asthma as well as chronic venous insufficiency and lymphedema. On this occasion she noted 2 dry scaly patch on her left leg. She tried to put lotion on this it didn't really help. There were 2 open areas.the patient has been seeing her primary physician from 02/05/18 through 02/14/18. She had Unna boots applied. The superior wound now on the lateral left leg has closed but she's had one wound that remains open on the lateral left leg. This is not the same spot as we dealt with in 2018. ABIs in this clinic were  1.3 bilaterally 02/26/18; patient has a small wound on the left lateral calf. Dimensions are down. She has chronic venous insufficiency and lymphedema. 03/05/18; small open area on the left lateral calf. Dimensions are down. Tightly adherent necrotic debris over the surface of the wound which was difficult to remove. Also the dressing [over collagen] stuck to the wound surface. This was removed with some difficulty as well. Change the primary dressing to Hydrofera Blue ready 03/12/18; small open area on the left lateral calf. Comes in with tightly adherent surface eschar as well as some adherent Hydrofera Blue. 03/19/18; open area on the left lateral calf. Again adherent surface eschar as well as some adherent Hydrofera Blue nonviable subcutaneous tissue. She complained of pain all week even with the reduction from 4-3 layer compression I put on last week. Also she had an increase in her ankle and calf measurements probably related to the same thing. 03/26/18; open area on the left lateral calf. A very small open area remains here. We used silver alginate starting last week as the Hydrofera Blue seem to stick to the wound bed. In using 4-layer compression 04/02/18; the open area in the left lateral calf at some adherent slough which I removed there is no open area here. We are able to transition her into her own compression stocking. Truthfully I think this is probably his support hose. However this does not maintain skin integrity will be limited. She cannot put over the toe compression stockings on because of neck problems hand problems etc. She is allergic to the lining layer of juxta lites. We might be forced to use extremitease stocking should this fail READMIT 11/24/2018 Patient is now a 73 year old woman who is not a diabetic. She has been in this clinic on at least 3 previous occasions largely with recurrent wounds on her left leg secondary to chronic venous insufficiency with secondary  lymphedema. Her situation is complicated by inability to get stockings on and an allergy to neoprene which is apparently a component and at least juxta lites and other stockings. As a result she really has not been wearing any stockings on her legs. She tells [place] that roughly 2 or 3 weeks ago she started noticing a stinging sensation just above her ankle on the left medial aspect. She has been diagnosed with pseudogout and she wondered whether this was what she was experiencing. She tried to dress this with something she bought at the store however subsequently it pulled skin off and now she has an open wound that is not  improving. She has been using Vaseline gauze with a cover bandage. She saw her primary doctor last week who put an Haematologist on her. ABIs in this clinic was 1.03 on the left [name], [name]. ([id number]) 2/12; the area is on the left medial ankle. Odd-looking wound with what looks to be surface epithelialization but a multitude of small petechial openings. This clearly not closed yet. We have been using silver alginate under 3 layer compression with TCA 2/19; the wound area did not look quite as good this week. Necrotic debris over the majority of the wound surface which required debridement. She continues to have a multitude of what looked to be small petechial openings. She reminds [place] that she had a biopsy on this initially during her first outbreak in 2015 in [place] [facility]. She expresses concern about this being a possible melanoma. 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 I assured her that this does not look anything like melanoma. Except for the petechial reaction it does look like a venous insufficiency area and she certainly has evidence of this on both sides 2/26; a difficult area on the left medial ankle. The patient clearly has chronic venous hypertension with some degree  of lymphedema. The odd thing about the area is the small petechial hemorrhages. I am not really sure how to explain this. This was present last time and this is not a compression injury. We have been using Hydrofera Blue which I changed to last week 3/4; still using Hydrofera Blue. Aggressive debridement today. She does not have known arterial issues. She has seen Dr. Kellie Simmering at [facility] vein [facility] vascular and and has an ablation on the left. [Anterior accessory branch of the greater saphenous]. From what I remember they did not feel she had an arterial issue. The patient has had this area biopsied in 2009 at [facility] dermatology and by her recollection they said this was "stasis". She is also follow-up with dermatology locally who thought that this was more of a vascular issue 3/11; using Hydrofera Blue. Aggressive debridement today. She does not have an arterial issue. We are using 3 layer compression although we may need to go to 4. The patient has been in for multiple changes to her wrap since I last saw her a week ago. She says that the area was leaking. I do not have too much more information on what was found 01/19/19 on evaluation today patient was actually being seen for a nurse visit when unfortunately she had the area on her left lateral lower extremity as well as weeping from the right lower extremity that became apparent. Therefore we did end up actually seeing her for a full visit with myself. She is having some pain at this site as well but fortunately nothing too significant at this point. No fevers, chills, nausea, or vomiting noted at this time. 3/18-Patient is back to the clinic with the left leg venous leg ulcer, the ulcer is larger in size, has a surface that is densely adherent with fibrinous tissue, the Hydrofera Blue was used but is densely adherent and there was difficulty in removing it. The right lower extremity was also wrapped for weeping edema. Patient has a new area  over the left lateral foot above the malleolus that is small and appears to have no debris with intact surrounding skin. Patient is on increased dose of Lasix also as a means to edema management  3/25; the patient has a nonhealing venous ulcer on the medial left leg and last week developed a smaller area on the lateral left calf. We have been using Hydrofera Blue with a contact layer. 4/1; no major change in these wounds areas. Left medial and more recently left lateral calf. I tried Iodoflex last week to aid in debridement she did not tolerate this. She stated her pain was terrible all week. She took the top layer of the 4 layer compression off. 4/8; the patient actually looks somewhat better in terms of her more prominent left lateral calf wound. There is some healthy looking tissue here. She is still complaining of a lot of discomfort. 4/15; patient in a lot of pain secondary to sciatica. She is on a prednisone taper prescribed by her primary physician. She has the 2 areas one on the left medial and more recently a smaller area on the left lateral calf. Both of these just above the malleoli 4/22; her back pain is better but she still states she is very uncomfortable and now feels she is intolerant to the [employer]. No real change in the wounds we have been using Sorbact. She has been previously intolerant to Iodoflex. There is not a lot of option about what we can use to debride this wound under compression that she no doubt needs. sHe states Ultram no longer works for her pain 4/29; no major change in the wounds slightly increased depth. Surface on the original medial wound perhaps somewhat improved however the more recent area on the lateral left ankle is 100% covered in very adherent debris we have been using Sorbact. She tolerates 4 layer compression well and her edema control is a lot better. She has not had to come in for a nurse check 5/6; no major change in the condition of the wounds.  She did consent to debridement today which was done with some difficulty. Continuing Sorbact. She did not tolerate Iodoflex. She was in for a check of her compression the day after we wrapped her last week this was adjusted but nothing much was found 5/13; no major change in the condition or area of the wounds. I was able to get a fairly aggressive debridement done on the lateral left leg wound. Even using Sorbact under compression. She came back in on Friday to have the wrap changed. She says she felt uncomfortable on the lateral aspect of her ankle. She has a long history of chronic venous insufficiency including previous ablation surgery on this side. 5/20-Patient returns for wounds on left leg with both wounds covered in slough, with the lateral leg wound larger in size, she has been in 3 layer compression and felt more comfortable, she describes pain in ankle, in leg and pins and needles in foot, and is about to try Pamelor for this 6/3; wounds on the left lateral and left medial leg. The area medially which is the most recent of the 2 seems to have had the largest increase in dimensions. We have been using Sorbac to try and debride the surface. She has been to see orthopedics BRIGETTA, HOLLENBACH ([id number]) they apparently did a plain x-ray that was indeterminant. Diagnosed her with neuropathy and they have ordered an MRI to determine if there is underlying osteomyelitis. This was not high on my thought list but I suppose it is prudent. We have advised her to make an appointment with vein and vascular in [place]. She has a history of a left greater saphenous  and accessory vein ablations I wonder if there is anything else that can be done from a surgical point of view to help in these difficult refractory wounds. We have previously healed this wound on one occasion but it keeps on reopening [medial side] 6/10; deep tissue culture I did last week I think on the left medial wound showed both  moderate E. coli and moderate staph aureus [MSSA]. She is going to require antibiotics and I have chosen Augmentin. We have been using Sorbact and we have made better looking wound surface on both sides but certainly no improvement in wound area. She was back in last Friday apparently for a dressing changes the wrap was hurting her outer left ankle. She has not managed to get a hold of vein and vascular in [place]. We are going to have to make her that appointment 6/17; patient is tolerating the Augmentin. She had an MRI that I think was ordered by orthopedic surgeon this did not show osteomyelitis or an abscess did suggest cellulitis. We have been using Sorbact to the lateral and medial ankles. We have been trying to arrange a follow-up appointment with vein and vascular in [place] or did her original ablations. We apparently an area sent the request to vein and vascular in [facility] 6/24; patient has completed the Augmentin. We do not yet have a vein and vascular appointment in [place]. I am not sure what the issue is here we have asked her to call tomorrow. We are using Sorbact. Making some improvements and especially the medial wound. Both surfaces however look better medial and lateral. 7/1; the patient has been in contact with vein and vascular in [place] but has not yet received an appointment. Using Sorbact we have gradually improve the wound surface with no improvement in surface area. She is approved for Apligraf but the wound surface still is not completely viable. She has not had to come in for a dressing change 7/8; the patient has an appointment with vein and vascular on 7/31 which is a Friday afternoon. She is concerned about getting back here for [place] to dress her wounds. I think it is important to have them goal for her venous reflux/history of ablations etc. to see if anything else can be done. She apparently tested positive for 1 of the blood tests with regards to  lupus and saw a rheumatologist. He has raised the issue of vasculitis again. I have had this thought in the past however the evidence seems overwhelming that this is a venous reflux etiology. If the rheumatologist tells me there is clinical and laboratory investigation is positive for lupus I will rethink this. 7/15; the patient's wound surfaces are quite a bit better. The medial area which was her original wound now has no depth although the lateral wound which was the more recent area actually appears larger. Both with viable surfaces which is indeed better. Using Sorbact. I wanted to use Apligraf on her however there is the issue of the vein and vascular appointment on 7/31 at 2:00 in the afternoon which would not allow her to get back to be rewrapped and they would no doubt remove the graft 7/22; the patient's wound surfaces have moderate amount of debris although generally look better. The lateral one is larger with 2 small satellite areas superiorly. We are waiting for her vein and vascular appointment on 7/31. She has been approved for Apligraf which I would like to use after th 7/29; wound surfaces have improved no debridement  is required we have been using Sorbact. She sees vein and vascular on Friday with this so question of whether anything can be done to lessen the likelihood of recurrence and/or speed the healing of these areas. She is already had previous ablations. She no doubt has severe venous hypertension 8/5-Patient returns at 1 week, she was in [place] for 3 days by her podiatrist, we have been using so backed to the wound, she has increased pain in both the wounds on the left lower leg especially the more distal one on the lateral aspect 8/12-Patient returns at 1 week and she is agreeable to having debridement in both wounds on her left leg today. We have been using Sorbact, and vascular studies were reviewed at last visit 8/19; the patient arrives with her wounds fairly clean  and no debridement is required. We have used Sorbact which is really done a nice job in cleaning up these very difficult wound surfaces. The patient saw Dr. Donzetta Matters of vascular surgery on 7/31. He did not feel that there was an arterial component. He felt that her treated greater saphenous vein is adequately addressed and that the small saphenous vein did not appear to be involved significantly. She was also noted to have deep venous reflux which is not treatable. Dr. Donzetta Matters mentioned the possibility of a central obstructive component leading to reflux and he offered her central venography. She wanted to discuss this or think about it. I have urged her to go ahead with this. She has had recurrent difficult wounds in these areas which do heal but after months in the clinic. If there is anything that can be done to reduce the likelihood of this I think it is worth it. 9/2 she is still working towards getting follow-up with Dr. Donzetta Matters to schedule her CT. Things are quite a bit worse venography. I put Apligraf on 2 weeks ago on both wounds on the medial and lateral part of her left lower leg. She arrives in clinic today with 3 superficial additional wounds above the area laterally and one below the wound medially. She describes a lot of discomfort. I think these are probably wrapped injuries. Does not look like she has cellulitis. 07/20/2019 on evaluation today patient appears to be doing somewhat poorly in regard to her lower extremity ulcers. She in fact showed signs of erythema in fact we may even be dealing with an infection at this time. Unfortunately I am unsure if this is just infection or if indeed there may be some allergic reaction that occurred as a result of the Apligraf application. With that being said that would be unusual but nonetheless not impossible in this patient is one who is unfortunately allergic to quite a bit. Currently we have been using the Sorbact which seems to do as well as  anything for her. I do think we may want to obtain RAUL, BROADUS ([id number]) a culture today to see if there is anything showing up there that may need to be addressed. 9/16; noted that last week the wounds look worse in 1 week follow-up of the Apligraf. Using Sorbact as of 2 days ago. She arrives with copious amounts of drainage and new skin breakdown on the back of the left calf. The wounds arm more substantial bilaterally. There is a fair amount of swelling in the left calf no overt DVT there is edema present I think in the left greater than right thigh. She is supposed to go on 9/28 for CT  venography. The wounds on the medial and lateral calf are worse and she has new skin breakdown posteriorly at least new for me. This is almost developing into a circumferential wound area The Apligraf was taken off last week which I agree with things are not going in the right direction a culture was done we do not have that back yet. She is on Augmentin that she started 2 days ago 9/23; dressing was changed by her nurses on Monday. In general there is no improvement in the wound areas although the area looks less angry than last week. She did get Augmentin for MSSA cultured on the 14th. She still appears to have too much swelling in the left leg even with 3 layer compression 9/30; the patient underwent her procedure on 9/28 by Dr. Donzetta Matters at [facility] vein specialist. She was discovered to have the common iliac vein measuring 12.2 mm but at the level of L4-L5 measured 3 mm. After stenting it measured 10 mm. 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 Rouleaux flow in the common femoral and femoral vein was observed much improved after stenting. We are using silver alginate to the wounds on the medial and lateral ankle on the left. 4 layer compression 10/7; the patient had fluid swelling around her knee and 4 layer compression. At the advice of vein and vascular this was reduced to 3 layer  which she is tolerating better. We have been using silver alginate under 3 layer compression since last Friday 10/14; arrives with the areas on the left ankle looking a lot better. Inflammation in the area also a lot better. She came in for a nurse check on 10/9 10/21; continued nice improvement. Slight improvements in surface area of both the medial and lateral wounds on the left. A lot of the satellite lesions in the weeping erythema around these from stasis dermatitis is resolved. We have been using silver alginate Objective Constitutional Sitting or standing Blood Pressure is within target range for patient.. Pulse regular and within target range for patient.Marland Kitchen Respirations regular, non-labored and within target range.. Temperature is normal and within the target range for the patient.Marland Kitchen appears in no distress. Vitals Time Taken: 12:50 PM, Height: 63 in, Weight: 224.7 lbs, BMI: 39.8, Temperature: 98.5 F, Pulse: 91 bpm, Respiratory Rate: 18 breaths/min, Blood Pressure: 143/77 mmHg. General Notes: Wound exam; the 2 original areas on the left medial and left lateral ankle continue to have a better looking surface. Still requiring aggressive debridement with an open curette to remove tightly adherent fibrinous debris however these continue to look better and slightly smaller. The degree of erythema here from stasis dermatitis is a lot better. Most of the satellite lesions are closed Integumentary (Hair, Skin) Wound #5 status is Open. Original cause of wound was Gradually Appeared. The wound is located on the Left,Medial Lower Leg. The wound measures 2.5cm length x 2.4cm width x 0.2cm depth; 4.712cm^2 area and 0.942cm^3 volume. There is Fat Layer (Subcutaneous Tissue) Exposed exposed. There is no tunneling or undermining noted. There is a large amount of serosanguineous drainage noted. The wound margin is flat and intact. There is small (1-33%) pale granulation within the [place]  ([id number]) wound bed. There is a large (67-100%) amount of necrotic tissue within the wound bed including Adherent Slough. Wound #6 status is Open. Original cause of wound was Gradually Appeared. The wound is located on the Left,Lateral Lower Leg. The wound measures 2.9cm length x 3.1cm width x 0.2cm  depth; 7.061cm^2 area and 1.412cm^3 volume. There is Fat Layer (Subcutaneous Tissue) Exposed exposed. There is no tunneling or undermining noted. There is a large amount of serosanguineous drainage noted. The wound margin is flat and intact. There is small (1-33%) pink granulation within the wound bed. There is a large (67-100%) amount of necrotic tissue within the wound bed including Adherent Slough. Wound #9 status is Open. Original cause of wound was Gradually Appeared. The wound is located on the Left,Lateral,Posterior Lower Leg. The wound measures 0.7cm length x 2cm width x 0.1cm depth; 1.1cm^2 area and 0.11cm^3 volume. There is Fat Layer (Subcutaneous Tissue) Exposed exposed. There is no tunneling or undermining noted. There is a large amount of serosanguineous drainage noted. The wound margin is flat and intact. There is large (67-100%) pink granulation within the wound bed. There is a small (1-33%) amount of necrotic tissue within the wound bed including Adherent Slough. Assessment Active Problems ICD-10 Non-pressure chronic ulcer of left calf limited to breakdown of skin Chronic venous hypertension (idiopathic) with inflammation of right lower extremity Lymphedema, not elsewhere classified Procedures Wound #5 Pre-procedure diagnosis of Wound #5 is a Lymphedema located on the Left,Medial Lower Leg . There was a Excisional Skin/Subcutaneous Tissue Debridement with a total area of 6 sq cm performed by [name] Dillon, MD. With the following instrument(s): Curette to remove Viable tissue/material. Material removed includes Subcutaneous Tissue and Slough and after achieving pain control  using Lidocaine. No specimens were taken. A time out was conducted at 13:05, prior to the start of the procedure. A Moderate amount of bleeding was controlled with Pressure. The procedure was tolerated well. Post Debridement Measurements: 2.5cm length x 2.4cm width x 0.3cm depth; 1.414cm^3 volume. Character of Wound/Ulcer Post Debridement is stable. Post procedure Diagnosis Wound #5: Same as Pre-Procedure Plan Wound Cleansing: Wound #5 Left,Medial Lower Leg: Cleanse wound with mild soap and water Wound #6 Left,Lateral Lower Leg: Cleanse wound with mild soap and water Wound #9 Left,Lateral,Posterior Lower Leg: Cleanse wound with mild soap and water [name], [name] Anesthetic (add to Medication List): Wound #5 Left,Medial Lower Leg: Topical Lidocaine 4% cream applied to wound bed prior to debridement (In Clinic Only). Wound #6 Left,Lateral Lower Leg: Topical Lidocaine 4% cream applied to wound bed prior to debridement (In Clinic Only). Wound #9 Left,Lateral,Posterior Lower Leg: Topical Lidocaine 4% cream applied to wound bed prior to debridement (In Clinic Only). Skin Barriers/Peri-Wound Care: Wound #5 Left,Medial Lower Leg: Triamcinolone Acetonide Ointment (TCA) Wound #6 Left,Lateral Lower Leg: Triamcinolone Acetonide Ointment (TCA) Wound #9 Left,Lateral,Posterior Lower Leg: Triamcinolone Acetonide Ointment (TCA) Primary Wound Dressing: Wound #5 Left,Medial Lower Leg: Silver Alginate Wound #6 Left,Lateral Lower Leg: Silver Alginate Wound #9 Left,Lateral,Posterior Lower Leg: Silver Alginate Secondary Dressing: Wound #5 Left,Medial Lower Leg: Drawtex Wound #6 Left,Lateral Lower Leg: Drawtex Wound #9 Left,Lateral,Posterior Lower Leg: Drawtex Dressing Change Frequency: Wound #5 Left,Medial Lower Leg: Change dressing every week Other: - as needed. Wound #6 Left,Lateral Lower Leg: Change dressing every week Other: - as needed. Wound #9 Left,Lateral,Posterior  Lower Leg: Change dressing every week Other: - as needed. Follow-up Appointments: Wound #5 Left,Medial Lower Leg: Return Appointment in 1 week. Nurse Visit as needed - Call of needed Wound #6 Left,Lateral Lower Leg: Return Appointment in 1 week. Nurse Visit as needed - Call of needed Wound #9 Left,Lateral,Posterior Lower Leg: Return Appointment in 1 week. Nurse Visit as needed - Call of needed Edema Control: Wound #5 Left,Medial Lower Leg: 3 Layer Compression System - Left  Lower Extremity Wound #6 Left,Lateral Lower Leg: 3 Layer Compression System - Left Lower Extremity Wound #9 Left,Lateral,Posterior Lower Leg: 3 Layer Compression System - Left Lower Extremity 1. Continue with TCA, silver alginate/drawtex under 3 layer compression. ALICJA, [name] Electronic Signature(s) Signed: 08/26/2019 5:06:38 PM By: Linton Ham MD Entered By: Linton Ham on 08/26/2019 13:20:33 [name], [name] -------------------------------------------------------------------------------- SuperBill Details Patient Name: ALAYSSA, SALVATORI. Date of Service: 08/26/2019 Medical Record Number: [id number] Patient Account Number: [id number] Date of Birth/Sex: [birth year] (73 y.o. F) Treating RN: Cornell Barman Primary Care Provider: [name] Other Clinician: Referring Provider: [name] Treating Provider/Extender: Tito Dine in Treatment: 37 Diagnosis Coding ICD-10 Codes Code Description [phone] Non-pressure chronic ulcer of left calf limited to breakdown of skin I87.321 Chronic venous hypertension (idiopathic) with inflammation of right lower extremity I89.0 Lymphedema, not elsewhere classified Facility Procedures CPT4 Code: [id number] Description: [id number] - DEB SUBQ TISSUE 20 SQ CM/< ICD-10 Diagnosis Description L97.221 Non-pressure chronic ulcer of left calf limited to breakdown Modifier: of skin Quantity: 1 Physician Procedures CPT4 Code:  [id number] Description: [id number] - WC PHYS SUBQ TISS 20 SQ CM ICD-10 Diagnosis Description L97.221 Non-pressure chronic ulcer of left calf limited to breakdown Modifier: of skin Quantity: 1 Electronic Signature(s) Signed: 08/26/2019 5:06:38 PM By: Linton Ham MD Entered By: Linton Ham on 08/26/2019 13:20:49

## 2019-08-28 ENCOUNTER — Other Ambulatory Visit: Payer: Self-pay | Admitting: Family Medicine

## 2019-09-02 ENCOUNTER — Encounter: Payer: Medicare Other | Admitting: Internal Medicine

## 2019-09-02 DIAGNOSIS — I872 Venous insufficiency (chronic) (peripheral): Secondary | ICD-10-CM | POA: Diagnosis not present

## 2019-09-02 DIAGNOSIS — L97221 Non-pressure chronic ulcer of left calf limited to breakdown of skin: Secondary | ICD-10-CM | POA: Diagnosis not present

## 2019-09-02 DIAGNOSIS — L97322 Non-pressure chronic ulcer of left ankle with fat layer exposed: Secondary | ICD-10-CM | POA: Diagnosis not present

## 2019-09-02 DIAGNOSIS — L97222 Non-pressure chronic ulcer of left calf with fat layer exposed: Secondary | ICD-10-CM | POA: Diagnosis not present

## 2019-09-02 DIAGNOSIS — I890 Lymphedema, not elsewhere classified: Secondary | ICD-10-CM | POA: Diagnosis not present

## 2019-09-03 NOTE — Progress Notes (Signed)
Amber, [name] ([id number]) Visit Report for 09/02/2019 Arrival Information Details Patient Name: [name], [name]. Date of Service: 09/02/2019 12:45 PM Medical Record Number: [id number] Patient Account Number: [id number] Date of Birth/Sex: [birth year] (73 y.o. F) Treating RN: Cornell Barman Primary Care Jamyria Ozanich: [name] Other Clinician: Referring Luismanuel Corman: [name] Treating Auna Mikkelsen/Extender: Tito Dine in Treatment: 57 Visit Information History Since Last Visit Added or deleted any medications: No Patient Arrived: Ambulatory Any new allergies or adverse reactions: No Arrival Time: 12:44 Had a fall or experienced change in No Accompanied By: self activities of daily living that may affect Transfer Assistance: None risk of falls: Patient Identification Verified: Yes Signs or symptoms of abuse/neglect since last visito No Secondary Verification Process Yes Hospitalized since last visit: No Completed: Implantable device outside of the clinic excluding No Patient Requires Transmission-Based No cellular tissue based products placed in the center Precautions: since last visit: Patient Has Alerts: Yes Has Dressing in Place as Prescribed: Yes Patient Alerts: Patient on Blood Has Compression in Place as Prescribed: Yes Thinner Pain Present Now: No aspirin 81 Electronic Signature(s) Signed: 09/02/2019 4:16:31 PM By: Lorine Bears RCP, RRT, CHT Entered By: Lorine Bears on 09/02/2019 12:45:04 Woehrle, [name] -------------------------------------------------------------------------------- Encounter Discharge Information Details Patient Name: Amber, [name]. Date of Service: 09/02/2019 12:45 PM Medical Record Number: [id number] Patient Account Number: [id number] Date of Birth/Sex: May 02, 1946 (73 y.o. F) Treating RN: Cornell Barman Primary Care Imogean Ciampa: [name] Other Clinician: Referring Yariana Hoaglund:  [name] Treating Nery Kalisz/Extender: Tito Dine in Treatment: 38 Encounter Discharge Information Items Discharge Condition: Stable Discharge Destination: Home Transportation: Private Auto Accompanied By: self Schedule Follow-up Appointment: Yes Clinical Summary of Care: Electronic Signature(s) Signed: 09/03/2019 10:04:30 AM By: [name], [name], RN, CWS, [name], [name] Entered By: [name], [name], RN, CWS, Kim on 09/02/2019 13:20:38 [name] -------------------------------------------------------------------------------- Lower Extremity Assessment Details Patient Name: Amber, [name]. Date of Service: 09/02/2019 12:45 PM Medical Record Number: [id number] Patient Account Number: [id number] Date of Birth/Sex: Apr 30, 1946 (73 y.o. F) Treating RN: Army Melia Primary Care Vada Yellen: [name] Other Clinician: Referring Paydon Carll: [name] Treating Orlie Cundari/Extender: Tito Dine in Treatment: 38 Edema Assessment Assessed: [Left: No] [Right: No] Edema: [Left: N] [Right: o] Calf Left: Right: Point of Measurement: 31 cm From Medial Instep 44 cm cm Ankle Left: Right: Point of Measurement: 12 cm From Medial Instep 24 cm cm Vascular Assessment Pulses: Dorsalis Pedis Palpable: [Left:Yes] Electronic Signature(s) Signed: 09/02/2019 4:32:44 PM By: Army Melia Entered By: Army Melia on 09/02/2019 12:53:08 Haglund, [name] -------------------------------------------------------------------------------- Multi Wound Chart Details Patient Name: Amber, [name]. Date of Service: 09/02/2019 12:45 PM Medical Record Number: [id number] Patient Account Number: [id number] Date of Birth/Sex: [birth year] (73 y.o. F) Treating RN: Cornell Barman Primary Care Markhi Kleckner: [name] Other Clinician: Referring Danelia Snodgrass: [name] Treating Natelie Ostrosky/Extender: Tito Dine in Treatment: 69 Vital Signs Height(in):  63 Pulse(bpm): 13 Weight(lbs): 224.7 Blood Pressure(mmHg): 149/77 Body Mass Index(BMI): 40 Temperature(F): 98.5 Respiratory Rate 16 (breaths/min): Photos: Wound Location: Left Lower Leg - Medial Left Lower Leg - Lateral Left Lower Leg - Lateral, Posterior Wounding Event: Gradually Appeared Gradually Appeared Gradually Appeared Primary Etiology: Lymphedema Venous Leg Ulcer Venous Leg Ulcer Comorbid History: Cataracts, Asthma, Sleep Cataracts, Asthma, Sleep Cataracts, Asthma, Sleep Apnea, Deep Vein Apnea, Deep Vein Apnea, Deep Vein Thrombosis, Hypertension, Thrombosis, Hypertension, Thrombosis, Hypertension, Peripheral Venous Disease, Peripheral Venous Disease, Peripheral Venous Disease, Osteoarthritis, Received Osteoarthritis, Received Osteoarthritis, Received Chemotherapy, Received Chemotherapy, Received  Chemotherapy, Received Radiation Radiation Radiation Date Acquired: 11/19/2018 01/19/2019 07/08/2019 Weeks of Treatment: 38 32 8 Wound Status: Open Open Open Measurements L x W x D 4.8x2.5x0.2 6x3.7x0.2 2.8x2.5x0.1 (cm) Area (cm) : 9.425 17.436 5.498 Volume (cm) : 1.885 3.487 0.55 % Reduction in Area: -10.30% -7825.50% -873.10% % Reduction in Volume: -120.50% -15750.00% -864.90% Classification: Full Thickness Without Full Thickness Without Full Thickness Without Exposed Support Structures Exposed Support Structures Exposed Support Structures Exudate Amount: Large Large Large Exudate Type: Serosanguineous Serosanguineous Serosanguineous Exudate Color: red, brown red, brown red, brown Wound Margin: Flat and Intact Flat and Intact Flat and Intact Granulation Amount: Small (1-33%) Small (1-33%) Large (67-100%) Granulation Quality: Pale Pink Pink Necrotic Amount: Large (67-100%) Large (67-100%) Small (1-33%) [name], [name] ([id number]) Exposed Structures: Fat Layer (Subcutaneous Fat Layer (Subcutaneous Fat Layer (Subcutaneous Tissue) Exposed: Yes Tissue) Exposed: Yes Tissue)  Exposed: Yes Fascia: No Fascia: No Fascia: No Tendon: No Tendon: No Tendon: No Muscle: No Muscle: No Muscle: No Joint: No Joint: No Joint: No Bone: No Bone: No Bone: No Epithelialization: None None None Treatment Notes Wound #5 (Left, Medial Lower Leg) Notes TCA, silvercel, drawtex, abd, 3 layer wrap with unna to anchor Wound #6 (Left, Lateral Lower Leg) Notes TCA, silvercel, drawtex, abd, 3 layer wrap with unna to anchor Wound #9 (Left, Lateral, Posterior Lower Leg) Notes TCA, silvercel, drawtex, abd, 3 layer wrap with unna to anchor Electronic Signature(s) Signed: 09/02/2019 5:54:02 PM By: Linton Ham MD Entered By: Linton Ham on 09/02/2019 13:21:18 [name] -------------------------------------------------------------------------------- Multi-Disciplinary Care Plan Details Patient Name: EMILIJA, HUSTEAD. Date of Service: 09/02/2019 12:45 PM Medical Record Number: [id number] Patient Account Number: [id number] Date of Birth/Sex: [birth year] (73 y.o. F) Treating RN: Cornell Barman Primary Care Raynald Rouillard: [name] Other Clinician: Referring Tevita Gomer: [name] Treating Baila Rouse/Extender: Tito Dine in Treatment: 60 Active Inactive Orientation to the Wound Care Program Nursing Diagnoses: Knowledge deficit related to the wound healing center program Goals: Patient/caregiver will verbalize understanding of the [facility] Program Date Initiated: 12/10/2018 Target Resolution Date: 01/09/2019 Goal Status: Active Interventions: Provide education on orientation to the wound center Notes: Soft Tissue Infection Nursing Diagnoses: Impaired tissue integrity Goals: Patient's soft tissue infection will resolve Date Initiated: 12/10/2018 Target Resolution Date: 01/09/2019 Goal Status: Active Interventions: Assess signs and symptoms of infection every visit Notes: Venous Leg Ulcer Nursing Diagnoses: Actual venous  Insuffiency (use after diagnosis is confirmed) Goals: Patient will maintain optimal edema control Date Initiated: 12/10/2018 Target Resolution Date: 01/09/2019 Goal Status: Active Interventions: Assess peripheral edema status every visit. MARVINA, YRIGOYEN ([id number]) Treatment Activities: Therapeutic compression applied : 12/10/2018 Notes: Wound/Skin Impairment Nursing Diagnoses: Impaired tissue integrity Goals: Patient/caregiver will verbalize understanding of skin care regimen Date Initiated: 12/10/2018 Target Resolution Date: 01/09/2019 Goal Status: Active Interventions: Assess ulceration(s) every visit Treatment Activities: Topical wound management initiated : 12/10/2018 Notes: Electronic Signature(s) Signed: 09/03/2019 10:04:30 AM By: [name], [name], RN, CWS, [name], [name] Entered By: [name], [name], RN, CWS, Kim on 09/02/2019 13:17:25 Esquivel, [name] -------------------------------------------------------------------------------- Pain Assessment Details Patient Name: QUILLA, KEICHER. Date of Service: 09/02/2019 12:45 PM Medical Record Number: [id number] Patient Account Number: [id number] Date of Birth/Sex: 14-May-1946 (73 y.o. F) Treating RN: Cornell Barman Primary Care Lorra Freeman: [name] Other Clinician: Referring Rudene Poulsen: [name] Treating Bertin Inabinet/Extender: Tito Dine in Treatment: 38 Active Problems Location of Pain Severity and Description of Pain Patient Has Paino No Site Locations Pain Management and Medication Current Pain Management: Electronic Signature(s)  Signed: 09/02/2019 4:16:31 PM By: Paulla Fore, RRT, CHT Signed: 09/03/2019 10:04:30 AM By: [name], [name], RN, CWS, [name], [name] Entered By: Lorine Bears on 09/02/2019 12:45:46 MARGRIE, BALLARD ([id number]) -------------------------------------------------------------------------------- Patient/Caregiver Education Details Patient Name: KEARIE, PROPER. Date of Service: 09/02/2019 12:45 PM Medical Record Number: [id number] Patient Account Number: [id number] Date of Birth/Gender: Aug 22, 1946 (73 y.o. F) Treating RN: Cornell Barman Primary Care Physician: [name] Other Clinician: Referring Physician: [name] Treating Physician/Extender: Tito Dine in Treatment: 101 Education Assessment Education Provided To: Patient Education Topics Provided Venous: Handouts: Controlling Swelling with Multilayered Compression Wraps Methods: Demonstration, Explain/Verbal Responses: State content correctly Electronic Signature(s) Signed: 09/03/2019 10:04:30 AM By: [name], [name], RN, CWS, [name], [name] Entered By: [name], [name], RN, CWS, Kim on 09/02/2019 13:19:49 [name] ([id number]) -------------------------------------------------------------------------------- Wound Assessment Details Patient Name: AUGUST, LABRECK. Date of Service: 09/02/2019 12:45 PM Medical Record Number: [id number] Patient Account Number: [id number] Date of Birth/Sex: 12-05-1945 (73 y.o. F) Treating RN: Army Melia Primary Care Laine Giovanetti: [name] Other Clinician: Referring Kyran Whittier: [name] Treating Delia Slatten/Extender: Tito Dine in Treatment: 38 Wound Status Wound Number: 5 Primary Lymphedema Etiology: Wound Location: Left Lower Leg - Medial Wound Open Wounding Event: Gradually Appeared Status: Date Acquired: 11/19/2018 Comorbid Cataracts, Asthma, Sleep Apnea, Deep Vein Weeks Of Treatment: 38 History: Thrombosis, Hypertension, Peripheral Venous Clustered Wound: No Disease, Osteoarthritis, Received Chemotherapy, Received Radiation Photos Wound Measurements Length: (cm) 4.8 Width: (cm) 2.5 Depth: (cm) 0.2 Area: (cm) 9.425 Volume: (cm) 1.885 % Reduction in Area: -10.3% % Reduction in Volume: -120.5% Epithelialization: None Tunneling: No Undermining: No Wound Description Full Thickness Without Exposed  Support Classification: Structures Wound Margin: Flat and Intact Exudate Large Amount: Exudate Type: Serosanguineous Exudate Color: red, brown Foul Odor After Cleansing: No Slough/Fibrino Yes Wound Bed Granulation Amount: Small (1-33%) Exposed Structure Granulation Quality: Pale Fascia Exposed: No Necrotic Amount: Large (67-100%) Fat Layer (Subcutaneous Tissue) Exposed: Yes Necrotic Quality: Adherent Slough Tendon Exposed: No Muscle Exposed: No Joint Exposed: No Bone Exposed: No Dedman, Rhona J. ([id number]) Treatment Notes Wound #5 (Left, Medial Lower Leg) Notes TCA, silvercel, drawtex, abd, 3 layer wrap with unna to anchor Electronic Signature(s) Signed: 09/02/2019 4:32:44 PM By: Army Melia Entered By: Army Melia on 09/02/2019 12:51:22 Denes, [name] ([id number]) -------------------------------------------------------------------------------- Wound Assessment Details Patient Name: CHEALSEA, NORTON. Date of Service: 09/02/2019 12:45 PM Medical Record Number: [id number] Patient Account Number: [id number] Date of Birth/Sex: 1946-05-18 (73 y.o. F) Treating RN: Army Melia Primary Care Emrah Ariola: [name] Other Clinician: Referring Jacquelyn Shadrick: [name] Treating Yanina Knupp/Extender: Tito Dine in Treatment: 38 Wound Status Wound Number: 6 Primary Venous Leg Ulcer Etiology: Wound Location: Left Lower Leg - Lateral Wound Open Wounding Event: Gradually Appeared Status: Date Acquired: 01/19/2019 Comorbid Cataracts, Asthma, Sleep Apnea, Deep Vein Weeks Of Treatment: 32 History: Thrombosis, Hypertension, Peripheral Venous Clustered Wound: No Disease, Osteoarthritis, Received Chemotherapy, Received Radiation Photos Wound Measurements Length: (cm) 6 Width: (cm) 3.7 Depth: (cm) 0.2 Area: (cm) 17.436 Volume: (cm) 3.487 % Reduction in Area: -7825.5% % Reduction in Volume: -15750% Epithelialization: None Tunneling: No Undermining:  No Wound Description Full Thickness Without Exposed Support Classification: Structures Wound Margin: Flat and Intact Exudate Large Amount: Exudate Type: Serosanguineous Exudate Color: red, brown Foul Odor After Cleansing: No Slough/Fibrino Yes Wound Bed Granulation Amount: Small (1-33%) Exposed Structure Granulation Quality: Pink Fascia Exposed: No Necrotic Amount: Large (67-100%) Fat Layer (Subcutaneous Tissue) Exposed: Yes Necrotic Quality: Adherent Slough Tendon Exposed: No Muscle  Exposed: No Joint Exposed: No Bone Exposed: No CLAUDEAN, SIEMSEN ([id number]) Treatment Notes Wound #6 (Left, Lateral Lower Leg) Notes TCA, silvercel, drawtex, abd, 3 layer wrap with unna to anchor Electronic Signature(s) Signed: 09/02/2019 4:32:44 PM By: Army Melia Entered By: Army Melia on 09/02/2019 12:51:44 Pardon, [name] ([id number]) -------------------------------------------------------------------------------- Wound Assessment Details Patient Name: NOUF, SIPE. Date of Service: 09/02/2019 12:45 PM Medical Record Number: [id number] Patient Account Number: [id number] Date of Birth/Sex: 1946-08-10 (73 y.o. F) Treating RN: Army Melia Primary Care Zyona Pettaway: [name] Other Clinician: Referring Zareth Rippetoe: [name] Treating Tyreshia Ingman/Extender: Tito Dine in Treatment: 38 Wound Status Wound Number: 9 Primary Venous Leg Ulcer Etiology: Wound Location: Left Lower Leg - Lateral, Posterior Wound Open Wounding Event: Gradually Appeared Status: Date Acquired: 07/08/2019 Comorbid Cataracts, Asthma, Sleep Apnea, Deep Vein Weeks Of Treatment: 8 History: Thrombosis, Hypertension, Peripheral Venous Clustered Wound: No Disease, Osteoarthritis, Received Chemotherapy, Received Radiation Photos Wound Measurements Length: (cm) 2.8 Width: (cm) 2.5 Depth: (cm) 0.1 Area: (cm) 5.498 Volume: (cm) 0.55 % Reduction in Area: -873.1% % Reduction in Volume:  -864.9% Epithelialization: None Tunneling: No Undermining: No Wound Description Full Thickness Without Exposed Support Classification: Structures Wound Margin: Flat and Intact Exudate Large Amount: Exudate Type: Serosanguineous Exudate Color: red, brown Foul Odor After Cleansing: No Slough/Fibrino Yes Wound Bed Granulation Amount: Large (67-100%) Exposed Structure Granulation Quality: Pink Fascia Exposed: No Necrotic Amount: Small (1-33%) Fat Layer (Subcutaneous Tissue) Exposed: Yes Necrotic Quality: Adherent Slough Tendon Exposed: No Muscle Exposed: No Joint Exposed: No Bone Exposed: No Choma, Paislyn J. ([id number]) Treatment Notes Wound #9 (Left, Lateral, Posterior Lower Leg) Notes TCA, silvercel, drawtex, abd, 3 layer wrap with unna to anchor Electronic Signature(s) Signed: 09/02/2019 4:32:44 PM By: Army Melia Entered By: Army Melia on 09/02/2019 12:52:05 Duba, [name] ([id number]) -------------------------------------------------------------------------------- Vitals Details Patient Name: GIUSEPPINA, MOLDER. Date of Service: 09/02/2019 12:45 PM Medical Record Number: [id number] Patient Account Number: [id number] Date of Birth/Sex: 11/17/1945 (73 y.o. F) Treating RN: Cornell Barman Primary Care Marissia Blackham: [name] Other Clinician: Referring Manreet Kiernan: [name] Treating Birdie Beveridge/Extender: Tito Dine in Treatment: 38 Vital Signs Time Taken: 12:45 Temperature (F): 98.5 Height (in): 63 Pulse (bpm): 88 Weight (lbs): 224.7 Respiratory Rate (breaths/min): 16 Body Mass Index (BMI): 39.8 Blood Pressure (mmHg): 149/77 Reference Range: 80 - 120 mg / dl Electronic Signature(s) Signed: 09/02/2019 4:16:31 PM By: Lorine Bears RCP, RRT, CHT Entered By: Lorine Bears on 09/02/2019 12:46:20

## 2019-09-03 NOTE — Progress Notes (Signed)
JASSEL, MOSCHETTO ([id number]) Visit Report for 09/02/2019 HPI Details Patient Name: [name], [name]. Date of Service: 09/02/2019 12:45 PM Medical Record Number: [id number] Patient Account Number: [id number] Date of Birth/Sex: 09/26/1946 (73 y.o. F) Treating RN: Cornell Barman Primary Care Provider: [name] Other Clinician: Referring Provider: [name] Treating Provider/Extender: Tito Dine in Treatment: 105 History of Present Illness HPI Description: Pleasant 73 year old with history of chronic venous insufficiency. No diabetes or peripheral vascular disease. Left ABI 1.29. Questionable history of left lower extremity DVT. She developed a recurrent ulceration on her left lateral calf in December 2015, which she attributes to poor diet and subsequent lower extremity edema. She underwent endovenous laser ablation of her left greater saphenous vein in 2010. She underwent laser ablation of accessory branch of left GSV in April 2016 by Dr. Kellie Simmering at [facility]. She was previously wearing Unna boots, which she tolerated well. Tolerating 2 layer compression and cadexomer iodine. She returns to clinic for follow-up and is without new complaints. She denies any significant pain at this time. She reports persistent pain with pressure. No claudication or ischemic rest pain. No fever or chills. No drainage. READMISSION 11/13/16; this is a 73 year old woman who is not a diabetic. She is here for a review of a painful area on her left medial lower extremity. I note that she was seen here previously last year for wound I believe to be in the same area. At that time she had undergone previously a left greater saphenous vein ablation by Dr. Kellie Simmering and she had a ablation of the anterior accessory branch of the left greater saphenous vein in March 2016. Seeing that the wound actually closed over. In reviewing the history with her today the ulcer in this area has been recurrent. She  describes a biopsy of this area in 2009 that only showed stasis physiology. 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 Do not see that she has ever had arterial studies in the left leg. She tells me she has a follow-up with Dr. Kellie Simmering in roughly 10 days In any case she developed the reopening of this area roughly a month ago. On the background of this she describes rapidly increasing edema which has responded to Lasix 40 mg and metolazone 2.5 mg as well as the patient's lymph massage. She has been told she has both venous insufficiency and lymphedema but she cannot tolerate compression stockings 11/28/16; the patient saw Dr. Kellie Simmering recently. Per the patient he did arterial Dopplers in the office that did not show evidence of arterial insufficiency, per the patient he stated "treat this like an ordinary venous ulcer". She also saw her dermatologist Dr. Ronnald Ramp who felt that this was more of a vascular ulcer. In general things are improving although she arrives today with increasing bilateral lower extremity edema with weeping a deeper fluid through the wound on the left medial leg compatible with some degree of lymphedema 12/04/16; the patient's wound is fully epithelialized but I don't think fully healed. We will do another week of depression with Promogran and TCA however I suspect we'll be able to discharge her next week. This is a very unusual-looking wound which was initially a figure-of-eight type wound lying on its side surrounded by petechial like hemorrhage. She  has had venous ablation on this side. She apparently does not have an arterial issue per Dr. Kellie Simmering. She saw her dermatologist thought it was "vascular". Patient is definitely going to need ongoing compression and I  talked about this with her today she will go to elastic therapy after she leaves here next week 12/11/16; the patient's wound is not completely closed today. She has surrounding scar tissue and in further discussion with the patient it would appear that she had ulcers in this area in 2009 for a prolonged period of time ultimately requiring a punch biopsy of this area that only showed venous insufficiency. I did not previously pickup on this part of the history from the patient. 12/18/16; the patient's wound is completely epithelialized. There is no open area here. She has significant bilateral venous insufficiency with secondary lymphedema to a mild-to-moderate degree she does not have compression stockings.. She did not say anything to me when I was in the room, she told our intake nurse that she was still having pain in this area. This isn't [name], [name] unusual recurrent small open area. She is going to go to elastic therapy to obtain compression stockings. 12/25/16; the patient's wound is fully epithelialized. There is no open area here. The patient describes some continued episodic discomfort in this area medial left calf. However everything looks fine and healed here. She is been to elastic therapy and caught herself 15-20 mmHg stockings, they apparently were having trouble getting 20-30 mm stockings in her size 01/22/17; this is a patient we discharged from the clinic a month ago. She has a recurrent open wound on her medial left calf. She had 15 mm support stockings. I told her I thought she needed 20-30 mm compression stockings. She tells me that she has been ill with hospitalization secondary to asthma and is been found to have severe hypokalemia likely secondary to a combination of Lasix and metolazone. This morning she noted blistering and leaking fluid on the posterior part of her left leg. She called our intake nurse urgently and we was saw her this afternoon. She has not  had any real discomfort here. I don't know that she's been wearing any stockings on this leg for at least 2-3 days. ABIs in this clinic were 1.21 on the right and 1.3 on the left. She is previously seen vascular surgery who does not think that there is a peripheral arterial issue. 01/30/17; Patient arrives with no open wound on the left leg. She has been to elastic therapy and obtained 20-75mmhg below knee stockings and she has one on the right leg today. READMISSION 02/19/18; this [name] is a now 73 year old patient we've had in this clinic perhaps 3 times before. I had last looked at her from January 07 December 2016 with an area on the medial left leg. We discharged her on 12/25/16 however she had to be readmitted on 01/22/17 with a recurrence. I have in my notes that we discharged her on 20-30 mm stockings although she tells me she was only wearing support hose because she cannot get stockings on predominantly related to her cervical spine surgery/issues. She has had previous ablations done by vein and vascular in [place] including a great saphenous vein ablation on the left with an anterior accessory branch ablation I think both of these were in 2016. On one of the previous visit she had a biopsy noted 2009 that was negative. She is not felt to have an arterial issue. She is not  a diabetic. She does have a history of obstructive sleep apnea hypertension asthma as well as chronic venous insufficiency and lymphedema. On this occasion she noted 2 dry scaly patch on her left leg. She tried to put lotion on this it didn't really help. There were 2 open areas.the patient has been seeing her primary physician from 02/05/18 through 02/14/18. She had Unna boots applied. The superior wound now on the lateral left leg has closed but she's had one wound that remains open on the lateral left leg. This is not the same spot as we dealt with in 2018. ABIs in this clinic were 1.3 bilaterally 02/26/18; patient has a  small wound on the left lateral calf. Dimensions are down. She has chronic venous insufficiency and lymphedema. 03/05/18; small open area on the left lateral calf. Dimensions are down. Tightly adherent necrotic debris over the surface of the wound which was difficult to remove. Also the dressing [over collagen] stuck to the wound surface. This was removed with some difficulty as well. Change the primary dressing to Hydrofera Blue ready 03/12/18; small open area on the left lateral calf. Comes in with tightly adherent surface eschar as well as some adherent Hydrofera Blue. 03/19/18; open area on the left lateral calf. Again adherent surface eschar as well as some adherent Hydrofera Blue nonviable subcutaneous tissue. She complained of pain all week even with the reduction from 4-3 layer compression I put on last week. Also she had an increase in her ankle and calf measurements probably related to the same thing. 03/26/18; open area on the left lateral calf. A very small open area remains here. We used silver alginate starting last week as the Hydrofera Blue seem to stick to the wound bed. In using 4-layer compression 04/02/18; the open area in the left lateral calf at some adherent slough which I removed there is no open area here. We are able to transition her into her own compression stocking. Truthfully I think this is probably his support hose. However this does not maintain skin integrity will be limited. She cannot put over the toe compression stockings on because of neck problems hand problems etc. She is allergic to the lining layer of juxta lites. We might be forced to use extremitease stocking should this fail READMIT 11/24/2018 Patient is now a 73 year old woman who is not a diabetic. She has been in this clinic on at least 3 previous occasions largely with recurrent wounds on her left leg secondary to chronic venous insufficiency with secondary lymphedema. Her situation is complicated by  inability to get stockings on and an allergy to neoprene which is apparently a component and at least juxta lites and other stockings. As a result she really has not been wearing any stockings on her legs. She tells [place] that roughly 2 or 3 weeks ago she started noticing a stinging sensation just above her ankle on the left medial aspect. She has been diagnosed with pseudogout and she wondered whether this was what she was experiencing. She tried to dress this with something she bought at the store however subsequently it pulled skin off and now she has an open wound that is not improving. She has been using Vaseline gauze with a cover bandage. She saw her primary doctor last week who [name], [name] put an Unna boot on her. ABIs in this clinic was 1.03 on the left 2/12; the area is on the left medial ankle. Odd-looking wound with what looks to be surface epithelialization  but a multitude of small petechial openings. This clearly not closed yet. We have been using silver alginate under 3 layer compression with TCA 2/19; the wound area did not look quite as good this week. Necrotic debris over the majority of the wound surface which required debridement. She continues to have a multitude of what looked to be small petechial openings. She reminds [place] that she had a biopsy on this initially during her first outbreak in 2015 in [place] [facility]. She expresses concern about this being a possible melanoma. 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 I assured her that this does not look anything like melanoma. Except for the petechial reaction it does look like a venous insufficiency area and she certainly has evidence of this on both sides 2/26; a difficult area on the left medial ankle. The patient clearly has chronic venous hypertension with some degree of lymphedema. The odd thing about the area is  the small petechial hemorrhages. I am not really sure how to explain this. This was present last time and this is not a compression injury. We have been using Hydrofera Blue which I changed to last week 3/4; still using Hydrofera Blue. Aggressive debridement today. She does not have known arterial issues. She has seen Dr. Kellie Simmering at [facility] vein and vascular and and has an ablation on the left. [Anterior accessory branch of the greater saphenous]. From what I remember they did not feel she had an arterial issue. The patient has had this area biopsied in 2009 at [facility] dermatology and by her recollection they said this was "stasis". She is also follow-up with dermatology locally who thought that this was more of a vascular issue 3/11; using Hydrofera Blue. Aggressive debridement today. She does not have an arterial issue. We are using 3 layer compression although we may need to go to 4. The patient has been in for multiple changes to her wrap since I last saw her a week ago. She says that the area was leaking. I do not have too much more information on what was found 01/19/19 on evaluation today patient was actually being seen for a nurse visit when unfortunately she had the area on her left lateral lower extremity as well as weeping from the right lower extremity that became apparent. Therefore we did end up actually seeing her for a full visit with myself. She is having some pain at this site as well but fortunately nothing too significant at this point. No fevers, chills, nausea, or vomiting noted at this time. 3/18-Patient is back to the clinic with the left leg venous leg ulcer, the ulcer is larger in size, has a surface that is densely adherent with fibrinous tissue, the Hydrofera Blue was used but is densely adherent and there was difficulty in removing it. The right lower extremity was also wrapped for weeping edema. Patient has a new area over the left lateral foot above the malleolus  that is small and appears to have no debris with intact surrounding skin. Patient is on increased dose of Lasix also as a means to edema management 3/25; the patient has a nonhealing venous ulcer on the medial left leg and last week developed a smaller area on the lateral left calf. We have been using Hydrofera Blue with a contact layer. 4/1; no major change in these wounds areas. Left medial and more recently left lateral calf. I tried Iodoflex last week  to aid in debridement she did not tolerate this. She stated her pain was terrible all week. She took the top layer of the 4 layer compression off. 4/8; the patient actually looks somewhat better in terms of her more prominent left lateral calf wound. There is some healthy looking tissue here. She is still complaining of a lot of discomfort. 4/15; patient in a lot of pain secondary to sciatica. She is on a prednisone taper prescribed by her primary physician. She has the 2 areas one on the left medial and more recently a smaller area on the left lateral calf. Both of these just above the malleoli 4/22; her back pain is better but she still states she is very uncomfortable and now feels she is intolerant to the [employer]. No real change in the wounds we have been using Sorbact. She has been previously intolerant to Iodoflex. There is not a lot of option about what we can use to debride this wound under compression that she no doubt needs. sHe states Ultram no longer works for her pain 4/29; no major change in the wounds slightly increased depth. Surface on the original medial wound perhaps somewhat improved however the more recent area on the lateral left ankle is 100% covered in very adherent debris we have been using Sorbact. She tolerates 4 layer compression well and her edema control is a lot better. She has not had to come in for a nurse check 5/6; no major change in the condition of the wounds. She did consent to debridement today which was  done with some difficulty. Continuing Sorbact. She did not tolerate Iodoflex. She was in for a check of her compression the day after we wrapped her last week this was adjusted but nothing much was found 5/13; no major change in the condition or area of the wounds. I was able to get a fairly aggressive debridement done on the lateral left leg wound. Even using Sorbact under compression. She came back in on Friday to have the wrap changed. She says she felt uncomfortable on the lateral aspect of her ankle. She has a long history of chronic venous insufficiency including previous ablation surgery on this side. 5/20-Patient returns for wounds on left leg with both wounds covered in slough, with the lateral leg wound larger in size, she has been in 3 layer compression and felt more comfortable, she describes pain in ankle, in leg and pins and needles in foot, [name], [name] ([id number]) and is about to try Pamelor for this 6/3; wounds on the left lateral and left medial leg. The area medially which is the most recent of the 2 seems to have had the largest increase in dimensions. We have been using Sorbac to try and debride the surface. She has been to see orthopedics they apparently did a plain x-ray that was indeterminant. Diagnosed her with neuropathy and they have ordered an MRI to determine if there is underlying osteomyelitis. This was not high on my thought list but I suppose it is prudent. We have advised her to make an appointment with vein and vascular in [place]. She has a history of a left greater saphenous and accessory vein ablations I wonder if there is anything else that can be done from a surgical point of view to help in these difficult refractory wounds. We have previously healed this wound on one occasion but it keeps on reopening [medial side] 6/10; deep tissue culture I did last week I think on the  left medial wound showed both moderate E. coli and moderate staph aureus  [MSSA]. She is going to require antibiotics and I have chosen Augmentin. We have been using Sorbact and we have made better looking wound surface on both sides but certainly no improvement in wound area. She was back in last Friday apparently for a dressing changes the wrap was hurting her outer left ankle. She has not managed to get a hold of vein and vascular in [place]. We are going to have to make her that appointment 6/17; patient is tolerating the Augmentin. She had an MRI that I think was ordered by orthopedic surgeon this did not show osteomyelitis or an abscess did suggest cellulitis. We have been using Sorbact to the lateral and medial ankles. We have been trying to arrange a follow-up appointment with vein and vascular in [place] or did her original ablations. We apparently an area sent the request to vein and vascular in [facility] 6/24; patient has completed the Augmentin. We do not yet have a vein and vascular appointment in [place]. I am not sure what the issue is here we have asked her to call tomorrow. We are using Sorbact. Making some improvements and especially the medial wound. Both surfaces however look better medial and lateral. 7/1; the patient has been in contact with vein and vascular in [place] but has not yet received an appointment. Using Sorbact we have gradually improve the wound surface with no improvement in surface area. She is approved for Apligraf but the wound surface still is not completely viable. She has not had to come in for a dressing change 7/8; the patient has an appointment with vein and vascular on 7/31 which is a Friday afternoon. She is concerned about getting back here for [place] to dress her wounds. I think it is important to have them goal for her venous reflux/history of ablations etc. to see if anything else can be done. She apparently tested positive for 1 of the blood tests with regards to lupus and saw a rheumatologist. He has raised  the issue of vasculitis again. I have had this thought in the past however the evidence seems overwhelming that this is a venous reflux etiology. If the rheumatologist tells me there is clinical and laboratory investigation is positive for lupus I will rethink this. 7/15; the patient's wound surfaces are quite a bit better. The medial area which was her original wound now has no depth although the lateral wound which was the more recent area actually appears larger. Both with viable surfaces which is indeed better. Using Sorbact. I wanted to use Apligraf on her however there is the issue of the vein and vascular appointment on 7/31 at 2:00 in the afternoon which would not allow her to get back to be rewrapped and they would no doubt remove the graft 7/22; the patient's wound surfaces have moderate amount of debris although generally look better. The lateral one is larger with 2 small satellite areas superiorly. We are waiting for her vein and vascular appointment on 7/31. She has been approved for Apligraf which I would like to use after th 7/29; wound surfaces have improved no debridement is required we have been using Sorbact. She sees vein and vascular on Friday with this so question of whether anything can be done to lessen the likelihood of recurrence and/or speed the healing of these areas. She is already had previous ablations. She no doubt has severe venous hypertension 8/5-Patient returns at 1 week, she  was in [employer] for 3 days by her podiatrist, we have been using so backed to the wound, she has increased pain in both the wounds on the left lower leg especially the more distal one on the lateral aspect 8/12-Patient returns at 1 week and she is agreeable to having debridement in both wounds on her left leg today. We have been using Sorbact, and vascular studies were reviewed at last visit 8/19; the patient arrives with her wounds fairly clean and no debridement is required. We have used  Sorbact which is really done a nice job in cleaning up these very difficult wound surfaces. The patient saw Dr. Donzetta Matters of vascular surgery on 7/31. He did not feel that there was an arterial component. He felt that her treated greater saphenous vein is adequately addressed and that the small saphenous vein did not appear to be involved significantly. She was also noted to have deep venous reflux which is not treatable. Dr. Donzetta Matters mentioned the possibility of a central obstructive component leading to reflux and he offered her central venography. She wanted to discuss this or think about it. I have urged her to go ahead with this. She has had recurrent difficult wounds in these areas which do heal but after months in the clinic. If there is anything that can be done to reduce the likelihood of this I think it is worth it. 9/2 she is still working towards getting follow-up with Dr. Donzetta Matters to schedule her CT. Things are quite a bit worse venography. I put Apligraf on 2 weeks ago on both wounds on the medial and lateral part of her left lower leg. She arrives in clinic today with 3 superficial additional wounds above the area laterally and one below the wound medially. She describes a lot of discomfort. I think these are probably wrapped injuries. Does not look like she has cellulitis. 07/20/2019 on evaluation today patient appears to be doing somewhat poorly in regard to her lower extremity ulcers. She in fact showed signs of erythema in fact we may even be dealing with an infection at this time. Unfortunately I am unsure if this [name], [name] is just infection or if indeed there may be some allergic reaction that occurred as a result of the Apligraf application. With that being said that would be unusual but nonetheless not impossible in this patient is one who is unfortunately allergic to quite a bit. Currently we have been using the Sorbact which seems to do as well as anything for her. I do  think we may want to obtain a culture today to see if there is anything showing up there that may need to be addressed. 9/16; noted that last week the wounds look worse in 1 week follow-up of the Apligraf. Using Sorbact as of 2 days ago. She arrives with copious amounts of drainage and new skin breakdown on the back of the left calf. The wounds arm more substantial bilaterally. There is a fair amount of swelling in the left calf no overt DVT there is edema present I think in the left greater than right thigh. She is supposed to go on 9/28 for CT venography. The wounds on the medial and lateral calf are worse and she has new skin breakdown posteriorly at least new for me. This is almost developing into a circumferential wound area The Apligraf was taken off last week which I agree with things are not going in the right direction a culture was done we  do not have that back yet. She is on Augmentin that she started 2 days ago 9/23; dressing was changed by her nurses on Monday. In general there is no improvement in the wound areas although the area looks less angry than last week. She did get Augmentin for MSSA cultured on the 14th. She still appears to have too much swelling in the left leg even with 3 layer compression 9/30; the patient underwent her procedure on 9/28 by Dr. Donzetta [facility]. She was discovered to have the common iliac vein measuring 12.2 mm but at the level of L4-L5 measured 3 mm. After stenting it measured 10 mm. 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 Rouleaux flow in the common femoral and femoral vein was observed much improved after stenting. We are using silver alginate to the wounds on the medial and lateral ankle on the left. 4 layer compression 10/7; the patient had fluid swelling around her knee and 4 layer compression. At the advice of vein and vascular this was reduced to 3 layer which she is tolerating better. We have been using  silver alginate under 3 layer compression since last Friday 10/14; arrives with the areas on the left ankle looking a lot better. Inflammation in the area also a lot better. She came in for a nurse check on 10/9 10/21; continued nice improvement. Slight improvements in surface area of both the medial and lateral wounds on the left. A lot of the satellite lesions in the weeping erythema around these from stasis dermatitis is resolved. We have been using silver alginate 10/28; general improvement in the entire wound areas although not a lot of change in dimensions the wound certainly looks better. There is a lot less in terms of venous inflammation. Continue silver alginate this week however look towards Hydrofera Blue next week Electronic Signature(s) Signed: 09/02/2019 5:54:02 PM By: Linton Ham MD Entered By: Linton Ham on 09/02/2019 13:22:36 LANEYA, KRIESEL ([id number]) -------------------------------------------------------------------------------- Physical Exam Details Patient Name: BRIGET, RODRIGUE. Date of Service: 09/02/2019 12:45 PM Medical Record Number: [id number] Patient Account Number: [id number] Date of Birth/Sex: 1946-08-08 (73 y.o. F) Treating RN: Cornell Barman Primary Care Provider: [name] Other Clinician: Referring Provider: [name] Treating Provider/Extender: Tito Dine in Treatment: 57 Constitutional Patient is hypertensive.. Pulse regular and within target range for patient.Marland Kitchen Respirations regular, non-labored and within target range.. Temperature is normal and within the target range for the patient.Marland Kitchen appears in no distress. Respiratory Respiratory effort is easy and symmetric bilaterally. Rate is normal at rest and on room air.. Cardiovascular Pedal pulses are palpable. Much better edema control. Integumentary (Hair, Skin) The degree of stasis dermatitis around the wound areas considerably better. Psychiatric No evidence of  depression, anxiety, or agitation. Calm, cooperative, and communicative. Appropriate interactions and affect.. Notes Wound exam; we are back to the 2 original wounds on the left medial and left lateral ankle. The wounds themselves have not come down in size or surface area but certainly the surfaces of the wounds look better. Wound situation looks better. All the secondary satellite lesions and inflammation is better Electronic Signature(s) Signed: 09/02/2019 5:54:02 PM By: Linton Ham MD Entered By: Linton Ham on 09/02/2019 13:42:03 Frane, [name] ([id number]) -------------------------------------------------------------------------------- Physician Orders Details Patient Name: ESPYN, DYES. Date of Service: 09/02/2019 12:45 PM Medical Record Number: [id number] Patient Account Number: [id number] Date of Birth/Sex: Jun 20, 1946 (73 y.o. F) Treating RN: Cornell Barman Primary Care Provider: [name] Other Clinician:  Referring Provider: [name] Treating Provider/Extender: Tito Dine in Treatment: 105 Verbal / Phone Orders: No Diagnosis Coding Wound Cleansing Wound #5 Left,Medial Lower Leg o Cleanse wound with mild soap and water Wound #6 Left,Lateral Lower Leg o Cleanse wound with mild soap and water Wound #9 Left,Lateral,Posterior Lower Leg o Cleanse wound with mild soap and water Anesthetic (add to Medication List) Wound #5 Left,Medial Lower Leg o Topical Lidocaine 4% cream applied to wound bed prior to debridement (In Clinic Only). Wound #6 Left,Lateral Lower Leg o Topical Lidocaine 4% cream applied to wound bed prior to debridement (In Clinic Only). Wound #9 Left,Lateral,Posterior Lower Leg o Topical Lidocaine 4% cream applied to wound bed prior to debridement (In Clinic Only). Skin Barriers/Peri-Wound Care Wound #5 Left,Medial Lower Leg o Triamcinolone Acetonide Ointment (TCA) Wound #6 Left,Lateral Lower Leg o Triamcinolone  Acetonide Ointment (TCA) Wound #9 Left,Lateral,Posterior Lower Leg o Triamcinolone Acetonide Ointment (TCA) Primary Wound Dressing Wound #5 Left,Medial Lower Leg o Silver Alginate Wound #6 Left,Lateral Lower Leg o Silver Alginate Wound #9 Left,Lateral,Posterior Lower Leg o Silver Alginate Secondary Dressing Wound #5 Left,Medial Lower Leg o Drawtex KANDYCE, ROOSEVELT. ([id number]) Wound #6 Left,Lateral Lower Leg o Drawtex Wound #9 Left,Lateral,Posterior Lower Leg o Drawtex Dressing Change Frequency Wound #5 Left,Medial Lower Leg o Change dressing every week o Other: - as needed. Wound #6 Left,Lateral Lower Leg o Change dressing every week o Other: - as needed. Wound #9 Left,Lateral,Posterior Lower Leg o Change dressing every week o Other: - as needed. Follow-up Appointments Wound #5 Left,Medial Lower Leg o Return Appointment in 1 week. o Nurse Visit as needed - Call of needed Wound #6 Left,Lateral Lower Leg o Return Appointment in 1 week. o Nurse Visit as needed - Call of needed Wound #9 Left,Lateral,Posterior Lower Leg o Return Appointment in 1 week. o Nurse Visit as needed - Call of needed Edema Control Wound #5 Left,Medial Lower Leg o 3 Layer Compression System - Left Lower Extremity Wound #6 Left,Lateral Lower Leg o 3 Layer Compression System - Left Lower Extremity Wound #9 Left,Lateral,Posterior Lower Leg o 3 Layer Compression System - Left Lower Extremity Electronic Signature(s) Signed: 09/02/2019 5:54:02 PM By: Linton Ham MD Signed: 09/03/2019 10:04:30 AM By: [name], [name], RN, CWS, [name], [name] Entered By: [name], [name], RN, CWS, Kim on 09/02/2019 13:18:56 Kleinschmidt, [name] ([id number]) -------------------------------------------------------------------------------- Problem List Details Patient Name: MAYLEAH, HESSER. Date of Service: 09/02/2019 12:45 PM Medical Record Number: [id number] Patient Account Number:  [id number] Date of Birth/Sex: 1946/06/04 (73 y.o. F) Treating RN: Cornell Barman Primary Care Provider: [name] Other Clinician: Referring Provider: [name] Treating Provider/Extender: Tito Dine in Treatment: 38 Active Problems ICD-10 Evaluated Encounter Code Description Active Date Today Diagnosis L97.221 Non-pressure chronic ulcer of left calf limited to breakdown of 01/07/2019 No Yes skin I87.321 Chronic venous hypertension (idiopathic) with inflammation of 12/10/2018 No Yes right lower extremity I89.0 Lymphedema, not elsewhere classified 12/10/2018 No Yes Inactive Problems Resolved Problems Electronic Signature(s) Signed: 09/02/2019 5:54:02 PM By: Linton Ham MD Entered By: Linton Ham on 09/02/2019 13:21:06 [name] ([id number]) -------------------------------------------------------------------------------- Progress Note Details Patient Name: [name], [name]. Date of Service: 09/02/2019 12:45 PM Medical Record Number: [id number] Patient Account Number: [id number] Date of Birth/Sex: Feb 22, 1946 (73 y.o. F) Treating RN: Cornell Barman Primary Care Provider: [name] Other Clinician: Referring Provider: [name] Treating Provider/Extender: Tito Dine in Treatment: 38 Subjective History of Present Illness (HPI) Pleasant 73 year old with history of chronic venous insufficiency.  No diabetes or peripheral vascular disease. Left ABI 1.29. Questionable history of left lower extremity DVT. She developed a recurrent ulceration on her left lateral calf in December 2015, which she attributes to poor diet and subsequent lower extremity edema. She underwent endovenous laser ablation of her left greater saphenous vein in 2010. She underwent laser ablation of accessory branch of left GSV in April 2016 by Dr. Kellie Simmering at [facility], [place]. She was previously wearing Unna boots, which she tolerated well. Tolerating 2 layer compression and  cadexomer iodine. She returns to clinic for follow-up and is without new complaints. She denies any significant pain at this time. She reports persistent pain with pressure. No claudication or ischemic rest pain. No fever or chills. No drainage. READMISSION 11/13/16; this is a 73 year old woman who is not a diabetic. She is here for a review of a painful area on her left medial lower extremity. I note that she was seen here previously last year for wound I believe to be in the same area. At that time she had undergone previously a left greater saphenous vein ablation by Dr. Kellie Simmering and she had a ablation of the anterior accessory branch of the left greater saphenous vein in March 2016. Seeing that the wound actually closed over. In reviewing the history with her today the ulcer in this area has been recurrent. She describes a biopsy of this area in 2009 that only showed stasis physiology. 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 Do not see that she has ever had arterial studies in the left leg. She tells me she has a follow-up with Dr. Kellie Simmering in roughly 10 days In any case she developed the reopening of this area roughly a month ago. On the background of this she describes rapidly increasing edema which has responded to Lasix 40 mg and metolazone 2.5 mg as well as the patient's lymph massage. She has been told she has both venous insufficiency and lymphedema but she cannot tolerate compression stockings 11/28/16; the patient saw Dr. Kellie Simmering recently. Per the patient he did arterial Dopplers in the office that did not show evidence of arterial insufficiency, per the patient he stated "treat this like an ordinary venous ulcer". She also saw her dermatologist Dr. Ronnald Ramp who felt that this  was more of a vascular ulcer. In general things are improving although she arrives today with increasing bilateral lower extremity edema with weeping a deeper fluid through the wound on the left medial leg compatible with some degree of lymphedema 12/04/16; the patient's wound is fully epithelialized but I don't think fully healed. We will do another week of depression with Promogran and TCA however I suspect we'll be able to discharge her next week. This is a very unusual-looking wound which was initially a figure-of-eight type wound lying on its side surrounded by petechial like hemorrhage. She has had venous ablation on this side. She apparently does not have an arterial issue per Dr. Kellie Simmering. She saw her dermatologist thought it was "vascular". Patient is definitely going to need ongoing compression and I talked about this with her today she will go to elastic therapy after she leaves here next week 12/11/16; the patient's wound is not completely closed today. She has surrounding scar tissue and in further discussion with  the patient it would appear that she had ulcers in this area in 2009 for a prolonged period of time ultimately requiring a punch biopsy of this area that only showed venous insufficiency. I did not previously pickup on this part of the history from the patient. 12/18/16; the patient's wound is completely epithelialized. There is no open area here. She has significant bilateral venous insufficiency with secondary lymphedema to a mild-to-moderate degree she does not have compression stockings.. She did not say anything to me when I was in the room, she told our intake nurse that she was still having pain in this area. This isn't unusual recurrent small open area. She is going to go to elastic therapy to obtain compression stockings. 12/25/16; the patient's wound is fully epithelialized. There is no open area here. The patient describes some continued episodic discomfort in this area  medial left calf. However everything looks fine and healed here. She is been to elastic therapy and [name], [name] caught herself 15-20 mmHg stockings, they apparently were having trouble getting 20-30 mm stockings in her size 01/22/17; this is a patient we discharged from the clinic a month ago. She has a recurrent open wound on her medial left calf. She had 15 mm support stockings. I told her I thought she needed 20-30 mm compression stockings. She tells me that she has been ill with hospitalization secondary to asthma and is been found to have severe hypokalemia likely secondary to a combination of Lasix and metolazone. This morning she noted blistering and leaking fluid on the posterior part of her left leg. She called our intake nurse urgently and we was saw her this afternoon. She has not had any real discomfort here. I don't know that she's been wearing any stockings on this leg for at least 2-3 days. ABIs in this clinic were 1.21 on the right and 1.3 on the left. She is previously seen vascular surgery who does not think that there is a peripheral arterial issue. 01/30/17; Patient arrives with no open wound on the left leg. She has been to elastic therapy and obtained 20-26mmhg below knee stockings and she has one on the right leg today. READMISSION 02/19/18; this [place] is a now 72 year old patient we've had in this clinic perhaps 3 times before. I had last looked at her from January 07 December 2016 with an area on the medial left leg. We discharged her on 12/25/16 however she had to be readmitted on 01/22/17 with a recurrence. I have in my notes that we discharged her on 20-30 mm stockings although she tells me she was only wearing support hose because she cannot get stockings on predominantly related to her cervical spine surgery/issues. She has had previous ablations done by vein and vascular in [place] including a great saphenous vein ablation on the left with an anterior  accessory branch ablation I think both of these were in 2016. On one of the previous visit she had a biopsy noted 2009 that was negative. She is not felt to have an arterial issue. She is not a diabetic. She does have a history of obstructive sleep apnea hypertension asthma as well as chronic venous insufficiency and lymphedema. On this occasion she noted 2 dry scaly patch on her left leg. She tried to put lotion on this it didn't really help. There were 2 open areas.the patient has been seeing her primary physician from 02/05/18 through 02/14/18. She had Unna boots applied. The superior wound now on the lateral  left leg has closed but she's had one wound that remains open on the lateral left leg. This is not the same spot as we dealt with in 2018. ABIs in this clinic were 1.3 bilaterally 02/26/18; patient has a small wound on the left lateral calf. Dimensions are down. She has chronic venous insufficiency and lymphedema. 03/05/18; small open area on the left lateral calf. Dimensions are down. Tightly adherent necrotic debris over the surface of the wound which was difficult to remove. Also the dressing [over collagen] stuck to the wound surface. This was removed with some difficulty as well. Change the primary dressing to Hydrofera Blue ready 03/12/18; small open area on the left lateral calf. Comes in with tightly adherent surface eschar as well as some adherent Hydrofera Blue. 03/19/18; open area on the left lateral calf. Again adherent surface eschar as well as some adherent Hydrofera Blue nonviable subcutaneous tissue. She complained of pain all week even with the reduction from 4-3 layer compression I put on last week. Also she had an increase in her ankle and calf measurements probably related to the same thing. 03/26/18; open area on the left lateral calf. A very small open area remains here. We used silver alginate starting last week as the Hydrofera Blue seem to stick to the wound bed. In using  4-layer compression 04/02/18; the open area in the left lateral calf at some adherent slough which I removed there is no open area here. We are able to transition her into her own compression stocking. Truthfully I think this is probably his support hose. However this does not maintain skin integrity will be limited. She cannot put over the toe compression stockings on because of neck problems hand problems etc. She is allergic to the lining layer of juxta lites. We might be forced to use extremitease stocking should this fail READMIT 11/24/2018 Patient is now a 73 year old woman who is not a diabetic. She has been in this clinic on at least 3 previous occasions largely with recurrent wounds on her left leg secondary to chronic venous insufficiency with secondary lymphedema. Her situation is complicated by inability to get stockings on and an allergy to neoprene which is apparently a component and at least juxta lites and other stockings. As a result she really has not been wearing any stockings on her legs. She tells [place] that roughly 2 or 3 weeks ago she started noticing a stinging sensation just above her ankle on the left medial aspect. She has been diagnosed with pseudogout and she wondered whether this was what she was experiencing. She tried to dress this with something she bought at the store however subsequently it pulled skin off and now she has an open wound that is not improving. She has been using Vaseline gauze with a cover bandage. She saw her primary doctor last week who put an Haematologist on her. ABIs in this clinic was 1.03 on the left RANYIA, BOTH. ([id number]) 2/12; the area is on the left medial ankle. Odd-looking wound with what looks to be surface epithelialization but a multitude of small petechial openings. This clearly not closed yet. We have been using silver alginate under 3 layer compression with TCA 2/19; the wound area did not look quite as good this week. Necrotic  debris over the majority of the wound surface which required debridement. She continues to have a multitude of what looked to be small petechial openings. She reminds [place] that she had a biopsy on this initially  during her first outbreak in 2015 in [place] [facility]. She expresses concern about this being a possible melanoma. 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 I assured her that this does not look anything like melanoma. Except for the petechial reaction it does look like a venous insufficiency area and she certainly has evidence of this on both sides 2/26; a difficult area on the left medial ankle. The patient clearly has chronic venous hypertension with some degree of lymphedema. The odd thing about the area is the small petechial hemorrhages. I am not really sure how to explain this. This was present last time and this is not a compression injury. We have been using Hydrofera Blue which I changed to last week 3/4; still using Hydrofera Blue. Aggressive debridement today. She does not have known arterial issues. She has seen Dr. Kellie Simmering at [facility] vein [facility] vascular and and has an ablation on the left. [Anterior accessory branch of the greater saphenous]. From what I remember they did not feel she had an arterial issue. The patient has had this area biopsied in 2009 at [facility] dermatology and by her recollection they said this was "stasis". She is also follow-up with dermatology locally who thought that this was more of a vascular issue 3/11; using Hydrofera Blue. Aggressive debridement today. She does not have an arterial issue. We are using 3 layer compression although we may need to go to 4. The patient has been in for multiple changes to her wrap since I last saw her a week ago. She says that the area was leaking. I do not have too much more information on what was found 01/19/19 on evaluation  today patient was actually being seen for a nurse visit when unfortunately she had the area on her left lateral lower extremity as well as weeping from the right lower extremity that became apparent. Therefore we did end up actually seeing her for a full visit with myself. She is having some pain at this site as well but fortunately nothing too significant at this point. No fevers, chills, nausea, or vomiting noted at this time. 3/18-Patient is back to the clinic with the left leg venous leg ulcer, the ulcer is larger in size, has a surface that is densely adherent with fibrinous tissue, the Hydrofera Blue was used but is densely adherent and there was difficulty in removing it. The right lower extremity was also wrapped for weeping edema. Patient has a new area over the left lateral foot above the malleolus that is small and appears to have no debris with intact surrounding skin. Patient is on increased dose of Lasix also as a means to edema management 3/25; the patient has a nonhealing venous ulcer on the medial left leg and last week developed a smaller area on the lateral left calf. We have been using Hydrofera Blue with a contact layer. 4/1; no major change in these wounds areas. Left medial and more recently left lateral calf. I tried Iodoflex last week to aid in debridement she did not tolerate this. She stated her pain was terrible all week. She took the top layer of the 4 layer compression off. 4/8; the patient actually looks somewhat better in terms of her more prominent left lateral calf wound. There is some healthy looking tissue here. She is still complaining of a lot of discomfort. 4/15; patient in a lot of pain secondary to sciatica. She is on  a prednisone taper prescribed by her primary physician. She has the 2 areas one on the left medial and more recently a smaller area on the left lateral calf. Both of these just above the malleoli 4/22; her back pain is better but she still  states she is very uncomfortable and now feels she is intolerant to the [employer]. No real change in the wounds we have been using Sorbact. She has been previously intolerant to Iodoflex. There is not a lot of option about what we can use to debride this wound under compression that she no doubt needs. sHe states Ultram no longer works for her pain 4/29; no major change in the wounds slightly increased depth. Surface on the original medial wound perhaps somewhat improved however the more recent area on the lateral left ankle is 100% covered in very adherent debris we have been using Sorbact. She tolerates 4 layer compression well and her edema control is a lot better. She has not had to come in for a nurse check 5/6; no major change in the condition of the wounds. She did consent to debridement today which was done with some difficulty. Continuing Sorbact. She did not tolerate Iodoflex. She was in for a check of her compression the day after we wrapped her last week this was adjusted but nothing much was found 5/13; no major change in the condition or area of the wounds. I was able to get a fairly aggressive debridement done on the lateral left leg wound. Even using Sorbact under compression. She came back in on Friday to have the wrap changed. She says she felt uncomfortable on the lateral aspect of her ankle. She has a long history of chronic venous insufficiency including previous ablation surgery on this side. 5/20-Patient returns for wounds on left leg with both wounds covered in slough, with the lateral leg wound larger in size, she has been in 3 layer compression and felt more comfortable, she describes pain in ankle, in leg and pins and needles in foot, and is about to try Pamelor for this 6/3; wounds on the left lateral and left medial leg. The area medially which is the most recent of the 2 seems to have had the largest increase in dimensions. We have been using Sorbac to try and debride  the surface. She has been to see orthopedics [name], [name] they apparently did a plain x-ray that was indeterminant. Diagnosed her with neuropathy and they have ordered an MRI to determine if there is underlying osteomyelitis. This was not high on my thought list but I suppose it is prudent. We have advised her to make an appointment with vein and vascular in [place]. She has a history of a left greater saphenous and accessory vein ablations I wonder if there is anything else that can be done from a surgical point of view to help in these difficult refractory wounds. We have previously healed this wound on one occasion but it keeps on reopening [medial side] 6/10; deep tissue culture I did last week I think on the left medial wound showed both moderate E. coli and moderate staph aureus [MSSA]. She is going to require antibiotics and I have chosen Augmentin. We have been using Sorbact and we have made better looking wound surface on both sides but certainly no improvement in wound area. She was back in last Friday apparently for a dressing changes the wrap was hurting her outer left ankle. She has not managed to get a  hold of vein and vascular in [place]. We are going to have to make her that appointment 6/17; patient is tolerating the Augmentin. She had an MRI that I think was ordered by orthopedic surgeon this did not show osteomyelitis or an abscess did suggest cellulitis. We have been using Sorbact to the lateral and medial ankles. We have been trying to arrange a follow-up appointment with vein and vascular in [place] or did her original ablations. We apparently an area sent the request to vein and vascular in [facility] 6/24; patient has completed the Augmentin. We do not yet have a vein and vascular appointment in [place]. I am not sure what the issue is here we have asked her to call tomorrow. We are using Sorbact. Making some improvements and especially the medial  wound. Both surfaces however look better medial and lateral. 7/1; the patient has been in contact with vein and vascular in [place] but has not yet received an appointment. Using Sorbact we have gradually improve the wound surface with no improvement in surface area. She is approved for Apligraf but the wound surface still is not completely viable. She has not had to come in for a dressing change 7/8; the patient has an appointment with vein and vascular on 7/31 which is a Friday afternoon. She is concerned about getting back here for [place] to dress her wounds. I think it is important to have them goal for her venous reflux/history of ablations etc. to see if anything else can be done. She apparently tested positive for 1 of the blood tests with regards to lupus and saw a rheumatologist. He has raised the issue of vasculitis again. I have had this thought in the past however the evidence seems overwhelming that this is a venous reflux etiology. If the rheumatologist tells me there is clinical and laboratory investigation is positive for lupus I will rethink this. 7/15; the patient's wound surfaces are quite a bit better. The medial area which was her original wound now has no depth although the lateral wound which was the more recent area actually appears larger. Both with viable surfaces which is indeed better. Using Sorbact. I wanted to use Apligraf on her however there is the issue of the vein and vascular appointment on 7/31 at 2:00 in the afternoon which would not allow her to get back to be rewrapped and they would no doubt remove the graft 7/22; the patient's wound surfaces have moderate amount of debris although generally look better. The lateral one is larger with 2 small satellite areas superiorly. We are waiting for her vein and vascular appointment on 7/31. She has been approved for Apligraf which I would like to use after th 7/29; wound surfaces have improved no debridement is required  we have been using Sorbact. She sees vein and vascular on Friday with this so question of whether anything can be done to lessen the likelihood of recurrence and/or speed the healing of these areas. She is already had previous ablations. She no doubt has severe venous hypertension 8/5-Patient returns at 1 week, she was in [place] for 3 days by her podiatrist, we have been using so backed to the wound, she has increased pain in both the wounds on the left lower leg especially the more distal one on the lateral aspect 8/12-Patient returns at 1 week and she is agreeable to having debridement in both wounds on her left leg today. We have been using Sorbact, and vascular studies were reviewed at last  visit 8/19; the patient arrives with her wounds fairly clean and no debridement is required. We have used Sorbact which is really done a nice job in cleaning up these very difficult wound surfaces. The patient saw Dr. Donzetta Matters of vascular surgery on 7/31. He did not feel that there was an arterial component. He felt that her treated greater saphenous vein is adequately addressed and that the small saphenous vein did not appear to be involved significantly. She was also noted to have deep venous reflux which is not treatable. Dr. Donzetta Matters mentioned the possibility of a central obstructive component leading to reflux and he offered her central venography. She wanted to discuss this or think about it. I have urged her to go ahead with this. She has had recurrent difficult wounds in these areas which do heal but after months in the clinic. If there is anything that can be done to reduce the likelihood of this I think it is worth it. 9/2 she is still working towards getting follow-up with Dr. Donzetta Matters to schedule her CT. Things are quite a bit worse venography. I put Apligraf on 2 weeks ago on both wounds on the medial and lateral part of her left lower leg. She arrives in clinic today with 3 superficial additional wounds  above the area laterally and one below the wound medially. She describes a lot of discomfort. I think these are probably wrapped injuries. Does not look like she has cellulitis. 07/20/2019 on evaluation today patient appears to be doing somewhat poorly in regard to her lower extremity ulcers. She in fact showed signs of erythema in fact we may even be dealing with an infection at this time. Unfortunately I am unsure if this is just infection or if indeed there may be some allergic reaction that occurred as a result of the Apligraf application. With that being said that would be unusual but nonetheless not impossible in this patient is one who is unfortunately allergic to quite a bit. Currently we have been using the Sorbact which seems to do as well as anything for her. I do think we may want to obtain [name], [name] a culture today to see if there is anything showing up there that may need to be addressed. 9/16; noted that last week the wounds look worse in 1 week follow-up of the Apligraf. Using Sorbact as of 2 days ago. She arrives with copious amounts of drainage and new skin breakdown on the back of the left calf. The wounds arm more substantial bilaterally. There is a fair amount of swelling in the left calf no overt DVT there is edema present I think in the left greater than right thigh. She is supposed to go on 9/28 for CT venography. The wounds on the medial and lateral calf are worse and she has new skin breakdown posteriorly at least new for me. This is almost developing into a circumferential wound area The Apligraf was taken off last week which I agree with things are not going in the right direction a culture was done we do not have that back yet. She is on Augmentin that she started 2 days ago 9/23; dressing was changed by her nurses on Monday. In general there is no improvement in the wound areas although the area looks less angry than last week. She did get Augmentin  for MSSA cultured on the 14th. She still appears to have too much swelling in the left leg even with 3 layer compression 9/30; the  patient underwent her procedure on 9/28 by Dr. Donzetta Matters at vascular and vein specialist. She was discovered to have the common iliac vein measuring 12.2 mm but at the level of L4-L5 measured 3 mm. After stenting it measured 10 mm. 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 Rouleaux flow in the common femoral and femoral vein was observed much improved after stenting. We are using silver alginate to the wounds on the medial and lateral ankle on the left. 4 layer compression 10/7; the patient had fluid swelling around her knee and 4 layer compression. At the advice of vein and vascular this was reduced to 3 layer which she is tolerating better. We have been using silver alginate under 3 layer compression since last Friday 10/14; arrives with the areas on the left ankle looking a lot better. Inflammation in the area also a lot better. She came in for a nurse check on 10/9 10/21; continued nice improvement. Slight improvements in surface area of both the medial and lateral wounds on the left. A lot of the satellite lesions in the weeping erythema around these from stasis dermatitis is resolved. We have been using silver alginate 10/28; general improvement in the entire wound areas although not a lot of change in dimensions the wound certainly looks better. There is a lot less in terms of venous inflammation. Continue silver alginate this week however look towards Hydrofera Blue next week Objective Constitutional Patient is hypertensive.. Pulse regular and within target range for patient.Marland Kitchen Respirations regular, non-labored and within target range.. Temperature is normal and within the target range for the patient.Marland Kitchen appears in no distress. Vitals Time Taken: 12:45 PM, Height: 63 in, Weight: 224.7 lbs, BMI: 39.8, Temperature: 98.5 F, Pulse: 88 bpm,  Respiratory Rate: 16 breaths/min, Blood Pressure: 149/77 mmHg. Respiratory Respiratory effort is easy and symmetric bilaterally. Rate is normal at rest and on room air.. Cardiovascular Pedal pulses are palpable. Much better edema control. Psychiatric No evidence of depression, anxiety, or agitation. Calm, cooperative, and communicative. Appropriate interactions and affect.Marland Kitchen [name], [name] General Notes: Wound exam; we are back to the 2 original wounds on the left medial and left lateral ankle. The wounds themselves have not come down in size or surface area but certainly the surfaces of the wounds look better. Wound situation looks better. All the secondary satellite lesions and inflammation is better Integumentary (Hair, Skin) The degree of stasis dermatitis around the wound areas considerably better. Wound #5 status is Open. Original cause of wound was Gradually Appeared. The wound is located on the Left,Medial Lower Leg. The wound measures 4.8cm length x 2.5cm width x 0.2cm depth; 9.425cm^2 area and 1.885cm^3 volume. There is Fat Layer (Subcutaneous Tissue) Exposed exposed. There is no tunneling or undermining noted. There is a large amount of serosanguineous drainage noted. The wound margin is flat and intact. There is small (1-33%) pale granulation within the wound bed. There is a large (67-100%) amount of necrotic tissue within the wound bed including Adherent Slough. Wound #6 status is Open. Original cause of wound was Gradually Appeared. The wound is located on the Left,Lateral Lower Leg. The wound measures 6cm length x 3.7cm width x 0.2cm depth; 17.436cm^2 area and 3.487cm^3 volume. There is Fat Layer (Subcutaneous Tissue) Exposed exposed. There is no tunneling or undermining noted. There is a large amount of serosanguineous drainage noted. The wound margin is flat and intact. There is small (1-33%) pink granulation within the wound bed. There is a large (67-100%) amount  of necrotic tissue within the wound bed including Adherent Slough. Wound #9 status is Open. Original cause of wound was Gradually Appeared. The wound is located on the Left,Lateral,Posterior Lower Leg. The wound measures 2.8cm length x 2.5cm width x 0.1cm depth; 5.498cm^2 area and 0.55cm^3 volume. There is Fat Layer (Subcutaneous Tissue) Exposed exposed. There is no tunneling or undermining noted. There is a large amount of serosanguineous drainage noted. The wound margin is flat and intact. There is large (67-100%) pink granulation within the wound bed. There is a small (1-33%) amount of necrotic tissue within the wound bed including Adherent Slough. Assessment Active Problems ICD-10 Non-pressure chronic ulcer of left calf limited to breakdown of skin Chronic venous hypertension (idiopathic) with inflammation of right lower extremity Lymphedema, not elsewhere classified Plan Wound Cleansing: Wound #5 Left,Medial Lower Leg: Cleanse wound with mild soap and water Wound #6 Left,Lateral Lower Leg: Cleanse wound with mild soap and water Wound #9 Left,Lateral,Posterior Lower Leg: Cleanse wound with mild soap and water Anesthetic (add to Medication List): Wound #5 Left,Medial Lower Leg: Topical Lidocaine 4% cream applied to wound bed prior to debridement (In Clinic Only). Wound #6 Left,Lateral Lower Leg: Topical Lidocaine 4% cream applied to wound bed prior to debridement (In Clinic Only). [name], [name] Wound #9 Left,Lateral,Posterior Lower Leg: Topical Lidocaine 4% cream applied to wound bed prior to debridement (In Clinic Only). Skin Barriers/Peri-Wound Care: Wound #5 Left,Medial Lower Leg: Triamcinolone Acetonide Ointment (TCA) Wound #6 Left,Lateral Lower Leg: Triamcinolone Acetonide Ointment (TCA) Wound #9 Left,Lateral,Posterior Lower Leg: Triamcinolone Acetonide Ointment (TCA) Primary Wound Dressing: Wound #5 Left,Medial Lower Leg: Silver Alginate Wound #6  Left,Lateral Lower Leg: Silver Alginate Wound #9 Left,Lateral,Posterior Lower Leg: Silver Alginate Secondary Dressing: Wound #5 Left,Medial Lower Leg: Drawtex Wound #6 Left,Lateral Lower Leg: Drawtex Wound #9 Left,Lateral,Posterior Lower Leg: Drawtex Dressing Change Frequency: Wound #5 Left,Medial Lower Leg: Change dressing every week Other: - as needed. Wound #6 Left,Lateral Lower Leg: Change dressing every week Other: - as needed. Wound #9 Left,Lateral,Posterior Lower Leg: Change dressing every week Other: - as needed. Follow-up Appointments: Wound #5 Left,Medial Lower Leg: Return Appointment in 1 week. Nurse Visit as needed - Call of needed Wound #6 Left,Lateral Lower Leg: Return Appointment in 1 week. Nurse Visit as needed - Call of needed Wound #9 Left,Lateral,Posterior Lower Leg: Return Appointment in 1 week. Nurse Visit as needed - Call of needed Edema Control: Wound #5 Left,Medial Lower Leg: 3 Layer Compression System - Left Lower Extremity Wound #6 Left,Lateral Lower Leg: 3 Layer Compression System - Left Lower Extremity Wound #9 Left,Lateral,Posterior Lower Leg: 3 Layer Compression System - Left Lower Extremity 1. Continue with TCA 2. Could consider pentoxifylline 3. Continue with silver alginate as the primary dressing but looking at [facility] next week ANAYA, ARNS ([id number]) Electronic Signature(s) Signed: 09/02/2019 5:54:02 PM By: Linton Ham MD Entered By: Linton Ham on 09/02/2019 13:43:01 Belles, [name] ([id number]) -------------------------------------------------------------------------------- SuperBill Details Patient Name: MELLINDA, PILLION. Date of Service: 09/02/2019 Medical Record Number: [id number] Patient Account Number: [id number] Date of Birth/Sex: Dec 07, 1945 (73 y.o. F) Treating RN: Cornell Barman Primary Care Provider: [name] Other Clinician: Referring Provider: [name] Treating Provider/Extender:  Tito Dine in Treatment: 38 Diagnosis Coding ICD-10 Codes Code Description [phone] Non-pressure chronic ulcer of left calf limited to breakdown of skin I87.321 Chronic venous hypertension (idiopathic) with inflammation of right lower extremity I89.0 Lymphedema, not elsewhere classified Facility Procedures CPT4 Code: [id number] Description: (Facility Use Only) [facility]  COMPR[facility] LWR LT LEG Modifier: Quantity: 1 Physician Procedures CPT4 Code Description: [id number] - WC PHYS LEVEL 3 - EST PT ICD-10 Diagnosis Description L97.221 Non-pressure chronic ulcer of left calf limited to breakdown I87.321 Chronic venous hypertension (idiopathic) with inflammation of I89.0 Lymphedema, not  elsewhere classified Modifier: of skin right lower extr Quantity: 1 emity Electronic Signature(s) Signed: 09/02/2019 5:54:02 PM By: Linton Ham MD Entered By: Linton Ham on 09/02/2019 13:43:39

## 2019-09-04 ENCOUNTER — Other Ambulatory Visit: Payer: Self-pay

## 2019-09-04 DIAGNOSIS — I872 Venous insufficiency (chronic) (peripheral): Secondary | ICD-10-CM

## 2019-09-07 ENCOUNTER — Other Ambulatory Visit: Payer: Self-pay

## 2019-09-07 ENCOUNTER — Encounter: Payer: Self-pay | Admitting: Family Medicine

## 2019-09-07 ENCOUNTER — Ambulatory Visit (INDEPENDENT_AMBULATORY_CARE_PROVIDER_SITE_OTHER): Payer: Medicare Other | Admitting: Podiatry

## 2019-09-07 DIAGNOSIS — M79674 Pain in right toe(s): Secondary | ICD-10-CM

## 2019-09-07 DIAGNOSIS — B351 Tinea unguium: Secondary | ICD-10-CM

## 2019-09-07 DIAGNOSIS — M79675 Pain in left toe(s): Secondary | ICD-10-CM

## 2019-09-07 DIAGNOSIS — I872 Venous insufficiency (chronic) (peripheral): Secondary | ICD-10-CM | POA: Diagnosis not present

## 2019-09-07 NOTE — Progress Notes (Signed)
Complaint:  Visit Type: Patient returns to my office for continued preventative foot care services. Complaint: Patient states" my nails have grown long and thick and become painful to walk and wear shoes"  The patient presents for preventative foot care services. No changes to ROS.  Patient says she has been wearing unna boots for venous ulcers on her left leg. Patient does want her big toenails cut short.  Podiatric Exam: Vascular: dorsalis pedis and posterior tibial pulses are palpable bilateral. Capillary return is immediate. Temperature gradient is WNL. Skin turgor WNL Varicosities  Feet  B/L  Swelling legs/ankle  B/L. Sensorium: Normal Semmes Weinstein monofilament test. Normal tactile sensation bilaterally. Nail Exam: Pt has thick disfigured discolored nails with subungual debris noted bilateral entire nail hallux through fifth toenails Ulcer Exam: There is no evidence of ulcer or pre-ulcerative changes or infection. Orthopedic Exam: Muscle tone and strength are WNL. No limitations in general ROM. No crepitus or effusions noted. Foot type and digits show no abnormalities. DJD  [name]  B/L Swelling ankles  B/L Skin: No Porokeratosis. No infection or ulcers.  Asymptomatic clavi.  Dried blood on base lateral border right hallux.  Blackened second toenail right foot.  Diagnosis:  Onychomycosis, , Pain in right toe, pain in left toes  Treatment & Plan Procedures and Treatment: Consent by patient was obtained for treatment procedures. The patient understood the discussion of treatment and procedures well. All questions were answered thoroughly reviewed. Debridement of mycotic and hypertrophic toenails, 1 through 5 bilateral and clearing of subungual debris. No ulceration, no infection noted.  Return Visit-Office Procedure: Patient instructed to return to the office for a follow up visit 9 weeks  for continued evaluation and treatment.    Gardiner Barefoot DPM

## 2019-09-09 ENCOUNTER — Other Ambulatory Visit: Payer: Self-pay

## 2019-09-09 ENCOUNTER — Encounter: Payer: Medicare Other | Attending: Internal Medicine | Admitting: Internal Medicine

## 2019-09-09 DIAGNOSIS — I890 Lymphedema, not elsewhere classified: Secondary | ICD-10-CM | POA: Insufficient documentation

## 2019-09-09 DIAGNOSIS — Z923 Personal history of irradiation: Secondary | ICD-10-CM | POA: Insufficient documentation

## 2019-09-09 DIAGNOSIS — I10 Essential (primary) hypertension: Secondary | ICD-10-CM | POA: Diagnosis not present

## 2019-09-09 DIAGNOSIS — Z85820 Personal history of malignant melanoma of skin: Secondary | ICD-10-CM | POA: Diagnosis not present

## 2019-09-09 DIAGNOSIS — L97222 Non-pressure chronic ulcer of left calf with fat layer exposed: Secondary | ICD-10-CM | POA: Insufficient documentation

## 2019-09-09 DIAGNOSIS — L97322 Non-pressure chronic ulcer of left ankle with fat layer exposed: Secondary | ICD-10-CM | POA: Diagnosis not present

## 2019-09-09 DIAGNOSIS — G4730 Sleep apnea, unspecified: Secondary | ICD-10-CM | POA: Diagnosis not present

## 2019-09-09 DIAGNOSIS — L97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d: Secondary | ICD-10-CM | POA: Insufficient documentation

## 2019-09-09 DIAGNOSIS — Z86718 Personal history of other venous thrombosis and embolism: Secondary | ICD-10-CM | POA: Diagnosis not present

## 2019-09-09 DIAGNOSIS — Z9221 Personal history of antineoplastic chemotherapy: Secondary | ICD-10-CM | POA: Insufficient documentation

## 2019-09-10 NOTE — Telephone Encounter (Signed)
The above patient or their representative was contacted and gave the following answers to these questions:         Do you have any of the following symptoms? No  Fever                    Cough                   Shortness of breath  Do  you have any of the following other symptoms?    muscle pain         vomiting,        diarrhea        rash         weakness        red eye        abdominal pain         bruising          bruising or bleeding              joint pain           severe headache    Have you been in contact with someone who was or has been sick in the past 2 weeks? No  Yes                 Unsure                         Unable to assess   Does the person that you were in contact with have any of the following symptoms?   Cough         shortness of breath           muscle pain         vomiting,            diarrhea            rash            weakness           fever            red eye           abdominal pain           bruising  or  bleeding                joint pain                severe headache                 COMMENTS OR ACTION PLAN FOR THIS PATIENT:   

## 2019-09-11 ENCOUNTER — Other Ambulatory Visit: Payer: Self-pay

## 2019-09-11 ENCOUNTER — Ambulatory Visit (INDEPENDENT_AMBULATORY_CARE_PROVIDER_SITE_OTHER): Payer: Medicare Other | Admitting: Physician Assistant

## 2019-09-11 ENCOUNTER — Ambulatory Visit (HOSPITAL_COMMUNITY)
Admission: RE | Admit: 2019-09-11 | Discharge: 2019-09-11 | Disposition: A | Discharge status: Home / Self Care | Payer: Medicare Other | Source: Ambulatory Visit | Attending: Vascular Surgery | Admitting: Vascular Surgery

## 2019-09-11 VITALS — BP 144/81 | HR 78 | Temp 97.8°F | Resp 12 | Ht 63.0 in | Wt 218.4 lb

## 2019-09-11 DIAGNOSIS — I872 Venous insufficiency (chronic) (peripheral): Secondary | ICD-10-CM | POA: Diagnosis not present

## 2019-09-11 DIAGNOSIS — I871 Compression of vein: Secondary | ICD-10-CM

## 2019-09-11 NOTE — Progress Notes (Signed)
[name], [name] Visit Report for 09/09/2019 Debridement Details Patient Name: [name], [name]. Date of Service: 09/09/2019 12:45 PM Medical Record Number: [id number] Patient Account Number: [id number] Date of Birth/Sex: 08/29/1946 (73 y.o. F) Treating RN: Cornell Barman Primary Care Provider: [name] Other Clinician: Referring Provider: [name] Treating Provider/Extender: Tito Dine in Treatment: 39 Debridement Performed for Wound #5 Left,Medial Lower Leg Assessment: Performed By: Physician [name] Dillon, MD Debridement Type: Debridement Level of Consciousness (Pre- Awake and Alert procedure): Pre-procedure Verification/Time Yes - 13:15 Out Taken: Start Time: 13:16 Pain Control: Lidocaine Total Area Debrided (L x W): 4.5 (cm) x 2.5 (cm) = 11.25 (cm) Tissue and other material Viable, Non-Viable, Slough, Subcutaneous, Slough debrided: Level: Skin/Subcutaneous Tissue Debridement Description: Excisional Instrument: Curette Bleeding: Minimum Hemostasis Achieved: Pressure Response to Treatment: Procedure was tolerated well Level of Consciousness Awake and Alert (Post-procedure): Post Debridement Measurements of Total Wound Length: (cm) 4.5 Width: (cm) 2.5 Depth: (cm) 0.3 Volume: (cm) 2.651 Character of Wound/Ulcer Post Debridement: Stable Post Procedure Diagnosis Same as Pre-procedure Electronic Signature(s) Signed: 09/10/2019 5:19:38 PM By: [name], [name], RN, CWS, [name], [name] Signed: 09/11/2019 7:47:21 AM By: Linton Ham MD Entered By: Linton Ham on 09/09/2019 13:36:30 Mckeever, [name] -------------------------------------------------------------------------------- HPI Details Patient Name: [name], [name]. Date of Service: 09/09/2019 12:45 PM Medical Record Number: [id number] Patient Account Number: [id number] Date of Birth/Sex: 1946-06-04 (73 y.o. F) Treating RN: Cornell Barman Primary Care Provider: [name] Other Clinician: Referring Provider: [name] Treating Provider/Extender: Tito Dine in Treatment: 62 History of Present Illness HPI Description: Pleasant 73 year old with history of chronic venous insufficiency. No diabetes or peripheral vascular disease. Left ABI 1.29. Questionable history of left lower extremity DVT. She developed a recurrent ulceration on her left lateral calf in December 2015, which she attributes to poor diet and subsequent lower extremity edema. She underwent endovenous laser ablation of her left greater saphenous vein in 2010. She underwent laser ablation of accessory branch of left GSV in April 2016 by Dr. Kellie Simmering at [facility]. She was previously wearing Unna boots, which she tolerated well. Tolerating 2 layer compression and cadexomer iodine. She returns to clinic for follow-up and is without new complaints. She denies any significant pain at this time. She reports persistent pain with pressure. No claudication or ischemic rest pain. No fever or chills. No drainage. READMISSION 11/13/16; this is a 73 year old woman who is not a diabetic. She is here for a review of a painful area on her left medial lower extremity. I note that she was seen here previously last year for wound I believe to be in the same area. At that time she had undergone previously a left greater saphenous vein ablation by Dr. Kellie Simmering and she had a ablation of the anterior accessory branch of the left greater saphenous vein in March 2016. Seeing that the wound actually closed over. In reviewing the history with her today the ulcer in this area has been recurrent. She describes a biopsy of this area in 2009 that only showed stasis physiology. 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 Do not see that she has ever had arterial studies in the left leg. She tells me she has a follow-up with Dr. Kellie Simmering in  roughly 10 days In any case she developed the reopening of this area roughly a month ago. On the background of this she describes rapidly increasing edema which has responded to Lasix 40 mg and metolazone 2.5 mg as well as the patient's lymph massage. She has been told she has both venous insufficiency and lymphedema but she cannot tolerate compression stockings 11/28/16; the patient saw Dr. Kellie Simmering recently. Per the patient he did arterial Dopplers in the office that did not show evidence of arterial insufficiency, per the patient he stated "treat this like an ordinary venous ulcer". She also saw her dermatologist Dr. Ronnald Ramp who felt that this was more of a vascular ulcer. In general things are improving although she arrives today with increasing bilateral lower extremity edema with weeping a deeper fluid through the wound on the left medial leg compatible with some degree of lymphedema 12/04/16; the patient's wound is fully epithelialized but I don't think fully healed. We will do another week of depression with Promogran and TCA however I suspect we'll be able to discharge her next week. This is a very unusual-looking wound which was initially a figure-of-eight type wound lying on its side surrounded by petechial like hemorrhage. She has had venous ablation on this side. She apparently does not have an arterial issue per Dr. Kellie Simmering. She saw her dermatologist thought it was "vascular". Patient is definitely going to need ongoing compression and I talked about this with her today she will go to elastic therapy after she leaves here next week 12/11/16; the patient's wound is not completely closed today. She has surrounding scar tissue and in further discussion with the patient it would appear that she had ulcers in this area in 2009 for a prolonged  period of time ultimately requiring a punch biopsy of this area that only showed venous insufficiency. I did not previously pickup on this part of the history from the patient. 12/18/16; the patient's wound is completely epithelialized. There is no open area here. She has significant bilateral venous insufficiency with secondary lymphedema to a mild-to-moderate degree she does not have compression stockings.. She did not say anything to me when I was in the room, she told our intake nurse that she was still having pain in this area. This isn't unusual recurrent small open area. She is going to go to elastic therapy to obtain compression stockings. 12/25/16; the patient's wound is fully epithelialized. There is no open area here. The patient describes some continued episodic discomfort in this area medial left calf. However everything looks fine and healed here. She is been to elastic therapy and caught herself 15-20 mmHg stockings, they apparently were having trouble getting 20-30 mm stockings in her size NEYRA, HAJDU ([id number]) 01/22/17; this is a patient we discharged from the clinic a month ago. She has a recurrent open wound on her medial left calf. She had 15 mm support stockings. I told her I thought she needed 20-30 mm compression stockings. She tells me that she has been ill with hospitalization secondary to asthma and is been found to have severe hypokalemia likely secondary to a combination of Lasix and metolazone. This morning she noted blistering and leaking fluid on the posterior part of her left leg. She called our intake nurse urgently and we was saw her this afternoon. She has not had any real discomfort here. I don't know that she's been wearing any stockings on this leg for at least 2-3 days. ABIs in this clinic were 1.21 on  the right and 1.3 on the left. She is previously seen vascular surgery who does not think that there is a peripheral arterial issue. 01/30/17; Patient  arrives with no open wound on the left leg. She has been to elastic therapy and obtained 20-45mmhg below knee stockings and she has one on the right leg today. READMISSION 02/19/18; this Filardi is a now 73 year old patient we've had in this clinic perhaps 3 times before. I had last looked at her from January 07 December 2016 with an area on the medial left leg. We discharged her on 12/25/16 however she had to be readmitted on 01/22/17 with a recurrence. I have in my notes that we discharged her on 20-30 mm stockings although she tells me she was only wearing support hose because she cannot get stockings on predominantly related to her cervical spine surgery/issues. She has had previous ablations done by vein and vascular in [place] including a great saphenous vein ablation on the left with an anterior accessory branch ablation I think both of these were in 2016. On one of the previous visit she had a biopsy noted 2009 that was negative. She is not felt to have an arterial issue. She is not a diabetic. She does have a history of obstructive sleep apnea hypertension asthma as well as chronic venous insufficiency and lymphedema. On this occasion she noted 2 dry scaly patch on her left leg. She tried to put lotion on this it didn't really help. There were 2 open areas.the patient has been seeing her primary physician from 02/05/18 through 02/14/18. She had Unna boots applied. The superior wound now on the lateral left leg has closed but she's had one wound that remains open on the lateral left leg. This is not the same spot as we dealt with in 2018. ABIs in this clinic were 1.3 bilaterally 02/26/18; patient has a small wound on the left lateral calf. Dimensions are down. She has chronic venous insufficiency and lymphedema. 03/05/18; small open area on the left lateral calf. Dimensions are down. Tightly adherent necrotic debris over the surface of the wound which was difficult to remove. Also the dressing  [over collagen] stuck to the wound surface. This was removed with some difficulty as well. Change the primary dressing to Hydrofera Blue ready 03/12/18; small open area on the left lateral calf. Comes in with tightly adherent surface eschar as well as some adherent Hydrofera Blue. 03/19/18; open area on the left lateral calf. Again adherent surface eschar as well as some adherent Hydrofera Blue nonviable subcutaneous tissue. She complained of pain all week even with the reduction from 4-3 layer compression I put on last week. Also she had an increase in her ankle and calf measurements probably related to the same thing. 03/26/18; open area on the left lateral calf. A very small open area remains here. We used silver alginate starting last week as the Hydrofera Blue seem to stick to the wound bed. In using 4-layer compression 04/02/18; the open area in the left lateral calf at some adherent slough which I removed there is no open area here. We are able to transition her into her own compression stocking. Truthfully I think this is probably his support hose. However this does not maintain skin integrity will be limited. She cannot put over the toe compression stockings on because of neck problems hand problems etc. She is allergic to the lining layer of juxta lites. We might be forced to use extremitease stocking should this fail READMIT  11/24/2018 Patient is now a 73 year old woman who is not a diabetic. She has been in this clinic on at least 3 previous occasions largely with recurrent wounds on her left leg secondary to chronic venous insufficiency with secondary lymphedema. Her situation is complicated by inability to get stockings on and an allergy to neoprene which is apparently a component and at least juxta lites and other stockings. As a result she really has not been wearing any stockings on her legs. She tells [place] that roughly 2 or 3 weeks ago she started noticing a stinging sensation just  above her ankle on the left medial aspect. She has been diagnosed with pseudogout and she wondered whether this was what she was experiencing. She tried to dress this with something she bought at the store however subsequently it pulled skin off and now she has an open wound that is not improving. She has been using Vaseline gauze with a cover bandage. She saw her primary doctor last week who put an Haematologist on her. ABIs in this clinic was 1.03 on the left [name], [name]. ([id number]) 2/12; the area is on the left medial ankle. Odd-looking wound with what looks to be surface epithelialization but a multitude of small petechial openings. This clearly not closed yet. We have been using silver alginate under 3 layer compression with TCA 2/19; the wound area did not look quite as good this week. Necrotic debris over the majority of the wound surface which required debridement. She continues to have a multitude of what looked to be small petechial openings. She reminds [place] that she had a biopsy on this initially during her first outbreak in 2015 in [place] dermatology. She expresses concern about this being a possible melanoma. 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 I assured her that this does not look anything like melanoma. Except for the petechial reaction it does look like a venous insufficiency area and she certainly has evidence of this on both sides 2/26; a difficult area on the left medial ankle. The patient clearly has chronic venous hypertension with some degree of lymphedema. The odd thing about the area is the small petechial hemorrhages. I am not really sure how to explain this. This was present last time and this is not a compression injury. We have been using Hydrofera Blue which I changed to last week 3/4; still using Hydrofera Blue. Aggressive debridement today. She does not have known  arterial issues. She has seen Dr. Kellie Simmering at [facility] vein and vascular and and has an ablation on the left. [Anterior accessory branch of the greater saphenous]. From what I remember they did not feel she had an arterial issue. The patient has had this area biopsied in 2009 at [facility] dermatology and by her recollection they said this was "stasis". She is also follow-up with dermatology locally who thought that this was more of a vascular issue 3/11; using Hydrofera Blue. Aggressive debridement today. She does not have an arterial issue. We are using 3 layer compression although we may need to go to 4. The patient has been in for multiple changes to her wrap since I last saw her a week ago. She says that the area was leaking. I do not have too much more information on what was found 01/19/19 on evaluation today patient was actually being seen for a nurse visit when unfortunately she had the area on her left lateral  lower extremity as well as weeping from the right lower extremity that became apparent. Therefore we did end up actually seeing her for a full visit with myself. She is having some pain at this site as well but fortunately nothing too significant at this point. No fevers, chills, nausea, or vomiting noted at this time. 3/18-Patient is back to the clinic with the left leg venous leg ulcer, the ulcer is larger in size, has a surface that is densely adherent with fibrinous tissue, the Hydrofera Blue was used but is densely adherent and there was difficulty in removing it. The right lower extremity was also wrapped for weeping edema. Patient has a new area over the left lateral foot above the malleolus that is small and appears to have no debris with intact surrounding skin. Patient is on increased dose of Lasix also as a means to edema management 3/25; the patient has a nonhealing venous ulcer on the medial left leg and last week developed a smaller area on the lateral left calf. We have  been using Hydrofera Blue with a contact layer. 4/1; no major change in these wounds areas. Left medial and more recently left lateral calf. I tried Iodoflex last week to aid in debridement she did not tolerate this. She stated her pain was terrible all week. She took the top layer of the 4 layer compression off. 4/8; the patient actually looks somewhat better in terms of her more prominent left lateral calf wound. There is some healthy looking tissue here. She is still complaining of a lot of discomfort. 4/15; patient in a lot of pain secondary to sciatica. She is on a prednisone taper prescribed by her primary physician. She has the 2 areas one on the left medial and more recently a smaller area on the left lateral calf. Both of these just above the malleoli 4/22; her back pain is better but she still states she is very uncomfortable and now feels she is intolerant to the [employer]. No real change in the wounds we have been using Sorbact. She has been previously intolerant to Iodoflex. There is not a lot of option about what we can use to debride this wound under compression that she no doubt needs. sHe states Ultram no longer works for her pain 4/29; no major change in the wounds slightly increased depth. Surface on the original medial wound perhaps somewhat improved however the more recent area on the lateral left ankle is 100% covered in very adherent debris we have been using Sorbact. She tolerates 4 layer compression well and her edema control is a lot better. She has not had to come in for a nurse check 5/6; no major change in the condition of the wounds. She did consent to debridement today which was done with some difficulty. Continuing Sorbact. She did not tolerate Iodoflex. She was in for a check of her compression the day after we wrapped her last week this was adjusted but nothing much was found 5/13; no major change in the condition or area of the wounds. I was able to get a fairly  aggressive debridement done on the lateral left leg wound. Even using Sorbact under compression. She came back in on Friday to have the wrap changed. She says she felt uncomfortable on the lateral aspect of her ankle. She has a long history of chronic venous insufficiency including previous ablation surgery on this side. 5/20-Patient returns for wounds on left leg with both wounds covered in slough, with the  lateral leg wound larger in size, she has been in 3 layer compression and felt more comfortable, she describes pain in ankle, in leg and pins and needles in foot, and is about to try Pamelor for this 6/3; wounds on the left lateral and left medial leg. The area medially which is the most recent of the 2 seems to have had the largest increase in dimensions. We have been using Sorbac to try and debride the surface. She has been to see orthopedics they apparently did a plain x-ray that was indeterminant. Diagnosed her with neuropathy and they have ordered an MRI to [name], [name]. ([id number]) determine if there is underlying osteomyelitis. This was not high on my thought list but I suppose it is prudent. We have advised her to make an appointment with vein and vascular in [place]. She has a history of a left greater saphenous and accessory vein ablations I wonder if there is anything else that can be done from a surgical point of view to help in these difficult refractory wounds. We have previously healed this wound on one occasion but it keeps on reopening [medial side] 6/10; deep tissue culture I did last week I think on the left medial wound showed both moderate E. coli and moderate staph aureus [MSSA]. She is going to require antibiotics and I have chosen Augmentin. We have been using Sorbact and we have made better looking wound surface on both sides but certainly no improvement in wound area. She was back in last Friday apparently for a dressing changes the wrap was hurting her outer  left ankle. She has not managed to get a hold of vein and vascular in [place]. We are going to have to make her that appointment 6/17; patient is tolerating the Augmentin. She had an MRI that I think was ordered by orthopedic surgeon this did not show osteomyelitis or an abscess did suggest cellulitis. We have been using Sorbact to the lateral and medial ankles. We have been trying to arrange a follow-up appointment with vein and vascular in [place] or did her original ablations. We apparently an area sent the request to vein and vascular in [facility] 6/24; patient has completed the Augmentin. We do not yet have a vein and vascular appointment in [place]. I am not sure what the issue is here we have asked her to call tomorrow. We are using Sorbact. Making some improvements and especially the medial wound. Both surfaces however look better medial and lateral. 7/1; the patient has been in contact with vein and vascular in [place] but has not yet received an appointment. Using Sorbact we have gradually improve the wound surface with no improvement in surface area. She is approved for Apligraf but the wound surface still is not completely viable. She has not had to come in for a dressing change 7/8; the patient has an appointment with vein and vascular on 7/31 which is a Friday afternoon. She is concerned about getting back here for [place] to dress her wounds. I think it is important to have them goal for her venous reflux/history of ablations etc. to see if anything else can be done. She apparently tested positive for 1 of the blood tests with regards to lupus and saw a rheumatologist. He has raised the issue of vasculitis again. I have had this thought in the past however the evidence seems overwhelming that this is a venous reflux etiology. If the rheumatologist tells me there is clinical and laboratory investigation is positive for  lupus I will rethink this. 7/15; the patient's wound  surfaces are quite a bit better. The medial area which was her original wound now has no depth although the lateral wound which was the more recent area actually appears larger. Both with viable surfaces which is indeed better. Using Sorbact. I wanted to use Apligraf on her however there is the issue of the vein and vascular appointment on 7/31 at 2:00 in the afternoon which would not allow her to get back to be rewrapped and they would no doubt remove the graft 7/22; the patient's wound surfaces have moderate amount of debris although generally look better. The lateral one is larger with 2 small satellite areas superiorly. We are waiting for her vein and vascular appointment on 7/31. She has been approved for Apligraf which I would like to use after th 7/29; wound surfaces have improved no debridement is required we have been using Sorbact. She sees vein and vascular on Friday with this so question of whether anything can be done to lessen the likelihood of recurrence and/or speed the healing of these areas. She is already had previous ablations. She no doubt has severe venous hypertension 8/5-Patient returns at 1 week, she was in [place] for 3 days by her podiatrist, we have been using so backed to the wound, she has increased pain in both the wounds on the left lower leg especially the more distal one on the lateral aspect 8/12-Patient returns at 1 week and she is agreeable to having debridement in both wounds on her left leg today. We have been using Sorbact, and vascular studies were reviewed at last visit 8/19; the patient arrives with her wounds fairly clean and no debridement is required. We have used Sorbact which is really done a nice job in cleaning up these very difficult wound surfaces. The patient saw Dr. Donzetta Matters of vascular surgery on 7/31. He did not feel that there was an arterial component. He felt that her treated greater saphenous vein is adequately addressed and that the small  saphenous vein did not appear to be involved significantly. She was also noted to have deep venous reflux which is not treatable. Dr. Donzetta Matters mentioned the possibility of a central obstructive component leading to reflux and he offered her central venography. She wanted to discuss this or think about it. I have urged her to go ahead with this. She has had recurrent difficult wounds in these areas which do heal but after months in the clinic. If there is anything that can be done to reduce the likelihood of this I think it is worth it. 9/2 she is still working towards getting follow-up with Dr. Donzetta Matters to schedule her CT. Things are quite a bit worse venography. I put Apligraf on 2 weeks ago on both wounds on the medial and lateral part of her left lower leg. She arrives in clinic today with 3 superficial additional wounds above the area laterally and one below the wound medially. She describes a lot of discomfort. I think these are probably wrapped injuries. Does not look like she has cellulitis. 07/20/2019 on evaluation today patient appears to be doing somewhat poorly in regard to her lower extremity ulcers. She in fact showed signs of erythema in fact we may even be dealing with an infection at this time. Unfortunately I am unsure if this is just infection or if indeed there may be some allergic reaction that occurred as a result of the Apligraf application. With that being said  that would be unusual but nonetheless not impossible in this patient is one who is unfortunately allergic to quite a bit. Currently we have been using the Sorbact which seems to do as well as anything for her. I do think we may want to obtain a culture today to see if there is anything showing up there that may need to be addressed. [name], [name] 9/16; noted that last week the wounds look worse in 1 week follow-up of the Apligraf. Using Sorbact as of 2 days ago. She arrives with copious amounts of drainage and new  skin breakdown on the back of the left calf. The wounds arm more substantial bilaterally. There is a fair amount of swelling in the left calf no overt DVT there is edema present I think in the left greater than right thigh. She is supposed to go on 9/28 for CT venography. The wounds on the medial and lateral calf are worse and she has new skin breakdown posteriorly at least new for me. This is almost developing into a circumferential wound area The Apligraf was taken off last week which I agree with things are not going in the right direction a culture was done we do not have that back yet. She is on Augmentin that she started 2 days ago 9/23; dressing was changed by her nurses on Monday. In general there is no improvement in the wound areas although the area looks less angry than last week. She did get Augmentin for MSSA cultured on the 14th. She still appears to have too much swelling in the left leg even with 3 layer compression 9/30; the patient underwent her procedure on 9/28 by Dr. Donzetta [facility]. She was discovered to have the common iliac vein measuring 12.2 mm but at the level of L4-L5 measured 3 mm. After stenting it measured 10 mm. 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 Rouleaux flow in the common femoral and femoral vein was observed much improved after stenting. We are using silver alginate to the wounds on the medial and lateral ankle on the left. 4 layer compression 10/7; the patient had fluid swelling around her knee and 4 layer compression. At the advice of vein and vascular this was reduced to 3 layer which she is tolerating better. We have been using silver alginate under 3 layer compression since last Friday 10/14; arrives with the areas on the left ankle looking a lot better. Inflammation in the area also a lot better. She came in for a nurse check on 10/9 10/21; continued nice improvement. Slight improvements in surface area of both  the medial and lateral wounds on the left. A lot of the satellite lesions in the weeping erythema around these from stasis dermatitis is resolved. We have been using silver alginate 10/28; general improvement in the entire wound areas although not a lot of change in dimensions the wound certainly looks better. There is a lot less in terms of venous inflammation. Continue silver alginate this week however look towards Hydrofera Blue next week 11/4; very adherent debris on the medial wound left wound is not as bad. We have been using silver alginate. Change to [facility] today Electronic Signature(s) Signed: 09/11/2019 7:47:21 AM By: Linton Ham MD Entered By: Linton Ham on 09/09/2019 13:36:57 Vig, [name] -------------------------------------------------------------------------------- Physical Exam Details Patient Name: LACYE, WEICHMAN. Date of Service: 09/09/2019 12:45 PM Medical Record Number: [id number] Patient Account Number: [id number] Date of Birth/Sex: 10-07-1946 (  73 y.o. F) Treating RN: Cornell Barman Primary Care Provider: [name] Other Clinician: Referring Provider: [name] Treating Provider/Extender: Tito Dine in Treatment: 39 Constitutional Sitting or standing Blood Pressure is within target range for patient.. Pulse regular and within target range for patient.Marland Kitchen Respirations regular, non-labored and within target range.. Temperature is normal and within the target range for the patient.Marland Kitchen appears in no distress. Respiratory Respiratory effort is easy and symmetric bilaterally. Rate is normal at rest and on room air.. Cardiovascular Pedal pulses palpable at the left dorsalis pedis and posterior tibial. Notes Wound exam; we are back to the 2 original wounds on the left medial and left lateral. Left medial did not have a viable surface. Debrided with a #5 curette to healthy granulation tissue the left lateral required no  debridement. Electronic Signature(s) Signed: 09/11/2019 7:47:21 AM By: Linton Ham MD Entered By: Linton Ham on 09/09/2019 13:38:57 Stratton, [name] ([id number]) -------------------------------------------------------------------------------- Physician Orders Details Patient Name: YUDIT, DEALBA. Date of Service: 09/09/2019 12:45 PM Medical Record Number: [id number] Patient Account Number: [id number] Date of Birth/Sex: [birth year] (73 y.o. F) Treating RN: Cornell Barman Primary Care Provider: [name] Other Clinician: Referring Provider: [name] Treating Provider/Extender: Tito Dine in Treatment: 69 Verbal / Phone Orders: No Diagnosis Coding Wound Cleansing Wound #5 Left,Medial Lower Leg o Cleanse wound with mild soap and water Wound #6 Left,Lateral Lower Leg o Cleanse wound with mild soap and water Wound #9 Left,Lateral,Posterior Lower Leg o Cleanse wound with mild soap and water Anesthetic (add to Medication List) Wound #5 Left,Medial Lower Leg o Topical Lidocaine 4% cream applied to wound bed prior to debridement (In Clinic Only). Wound #6 Left,Lateral Lower Leg o Topical Lidocaine 4% cream applied to wound bed prior to debridement (In Clinic Only). Wound #9 Left,Lateral,Posterior Lower Leg o Topical Lidocaine 4% cream applied to wound bed prior to debridement (In Clinic Only). Skin Barriers/Peri-Wound Care Wound #5 Left,Medial Lower Leg o Triamcinolone Acetonide Ointment (TCA) Wound #6 Left,Lateral Lower Leg o Triamcinolone Acetonide Ointment (TCA) Wound #9 Left,Lateral,Posterior Lower Leg o Triamcinolone Acetonide Ointment (TCA) Primary Wound Dressing Wound #5 Left,Medial Lower Leg o Hydrafera Blue Ready Transfer Wound #6 Left,Lateral Lower Leg o Hydrafera Blue Ready Transfer Wound #9 Left,Lateral,Posterior Lower Leg o Hydrafera Blue Ready Transfer Secondary Dressing Wound #5 Left,Medial Lower Leg o  Drawtex AAMARI, KRABBENHOFT. ([id number]) Wound #6 Left,Lateral Lower Leg o Drawtex Wound #9 Left,Lateral,Posterior Lower Leg o Drawtex Dressing Change Frequency Wound #5 Left,Medial Lower Leg o Change dressing every week o Other: - as needed. Wound #6 Left,Lateral Lower Leg o Change dressing every week o Other: - as needed. Wound #9 Left,Lateral,Posterior Lower Leg o Change dressing every week o Other: - as needed. Follow-up Appointments Wound #5 Left,Medial Lower Leg o Return Appointment in 1 week. o Nurse Visit as needed - Call of needed Wound #6 Left,Lateral Lower Leg o Return Appointment in 1 week. o Nurse Visit as needed - Call of needed Wound #9 Left,Lateral,Posterior Lower Leg o Return Appointment in 1 week. o Nurse Visit as needed - Call of needed Edema Control Wound #5 Left,Medial Lower Leg o 3 Layer Compression System - Left Lower Extremity - unna to anchor Wound #6 Left,Lateral Lower Leg o 3 Layer Compression System - Left Lower Extremity - unna to anchor Wound #9 Left,Lateral,Posterior Lower Leg o 3 Layer Compression System - Left Lower Extremity - unna to anchor Electronic Signature(s) Signed: 09/10/2019 5:19:38 PM By: [name], [name], RN, CWS, Kim  RN, [name] Signed: 09/11/2019 7:47:21 AM By: Linton Ham MD Entered By: [name], [name], RN, CWS, Kim on 09/09/2019 13:22:00 EMERI, SCHIMANSKI ([id number]) -------------------------------------------------------------------------------- Problem List Details Patient Name: SHAQUAY, PERLSTEIN. Date of Service: 09/09/2019 12:45 PM Medical Record Number: [id number] Patient Account Number: [id number] Date of Birth/Sex: 1945/12/19 (73 y.o. F) Treating RN: Cornell Barman Primary Care Provider: [name] Other Clinician: Referring Provider: [name] Treating Provider/Extender: Tito Dine in Treatment: 39 Active Problems ICD-10 Evaluated Encounter Code Description Active Date Today  Diagnosis L97.221 Non-pressure chronic ulcer of left calf limited to breakdown of 01/07/2019 No Yes skin I87.321 Chronic venous hypertension (idiopathic) with inflammation of 12/10/2018 No Yes right lower extremity I89.0 Lymphedema, not elsewhere classified 12/10/2018 No Yes Inactive Problems Resolved Problems Electronic Signature(s) Signed: 09/11/2019 7:47:21 AM By: Linton Ham MD Entered By: Linton Ham on 09/09/2019 13:36:11 Devers, [name] ([id number]) -------------------------------------------------------------------------------- Progress Note Details Patient Name: GOLIE, SIMONI. Date of Service: 09/09/2019 12:45 PM Medical Record Number: [id number] Patient Account Number: [id number] Date of Birth/Sex: Jul 07, 1946 (73 y.o. F) Treating RN: Cornell Barman Primary Care Provider: [name] Other Clinician: Referring Provider: [name] Treating Provider/Extender: Tito Dine in Treatment: 39 Subjective History of Present Illness (HPI) Pleasant 73 year old with history of chronic venous insufficiency. No diabetes or peripheral vascular disease. Left ABI 1.29. Questionable history of left lower extremity DVT. She developed a recurrent ulceration on her left lateral calf in December 2015, which she attributes to poor diet and subsequent lower extremity edema. She underwent endovenous laser ablation of her left greater saphenous vein in 2010. She underwent laser ablation of accessory branch of left GSV in April 2016 by Dr. Kellie Simmering at [facility]. She was previously wearing Unna boots, which she tolerated well. Tolerating 2 layer compression and cadexomer iodine. She returns to clinic for follow-up and is without new complaints. She denies any significant pain at this time. She reports persistent pain with pressure. No claudication or ischemic rest pain. No fever or chills. No drainage. READMISSION 11/13/16; this is a 73 year old woman who is not a diabetic. She is  here for a review of a painful area on her left medial lower extremity. I note that she was seen here previously last year for wound I believe to be in the same area. At that time she had undergone previously a left greater saphenous vein ablation by Dr. Kellie Simmering and she had a ablation of the anterior accessory branch of the left greater saphenous vein in March 2016. Seeing that the wound actually closed over. In reviewing the history with her today the ulcer in this area has been recurrent. She describes a biopsy of this area in 2009 that only showed stasis physiology. 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 Do not see that she has ever had arterial studies in the left leg. She tells me she has a follow-up with Dr. Kellie Simmering in roughly 10 days In any case she developed the reopening of this area roughly a month ago. On the background of this she describes rapidly increasing edema which has responded to Lasix 40 mg and metolazone 2.5 mg as well as the patient's lymph massage. She has been told she has both venous insufficiency and lymphedema but she cannot tolerate compression stockings  11/28/16; the patient saw Dr. Kellie Simmering recently. Per the patient he did arterial Dopplers in the office that did not show evidence of arterial insufficiency, per the patient he stated "treat this like an ordinary venous ulcer". She also saw her dermatologist Dr. Ronnald Ramp who felt that this was more of a vascular ulcer. In general things are improving although she arrives today with increasing bilateral lower extremity edema with weeping a deeper fluid through the wound on the left medial leg compatible with some degree of lymphedema 12/04/16; the patient's wound is fully epithelialized but I don't think fully  healed. We will do another week of depression with Promogran and TCA however I suspect we'll be able to discharge her next week. This is a very unusual-looking wound which was initially a figure-of-eight type wound lying on its side surrounded by petechial like hemorrhage. She has had venous ablation on this side. She apparently does not have an arterial issue per Dr. Kellie Simmering. She saw her dermatologist thought it was "vascular". Patient is definitely going to need ongoing compression and I talked about this with her today she will go to elastic therapy after she leaves here next week 12/11/16; the patient's wound is not completely closed today. She has surrounding scar tissue and in further discussion with the patient it would appear that she had ulcers in this area in 2009 for a prolonged period of time ultimately requiring a punch biopsy of this area that only showed venous insufficiency. I did not previously pickup on this part of the history from the patient. 12/18/16; the patient's wound is completely epithelialized. There is no open area here. She has significant bilateral venous insufficiency with secondary lymphedema to a mild-to-moderate degree she does not have compression stockings.. She did not say anything to me when I was in the room, she told our intake nurse that she was still having pain in this area. This isn't unusual recurrent small open area. She is going to go to elastic therapy to obtain compression stockings. 12/25/16; the patient's wound is fully epithelialized. There is no open area here. The patient describes some continued episodic discomfort in this area medial left calf. However everything looks fine and healed here. She is been to elastic therapy and ANSHU, STAAL ([id number]) caught herself 15-20 mmHg stockings, they apparently were having trouble getting 20-30 mm stockings in her size 01/22/17; this is a patient we discharged from the clinic a month ago. She has a  recurrent open wound on her medial left calf. She had 15 mm support stockings. I told her I thought she needed 20-30 mm compression stockings. She tells me that she has been ill with hospitalization secondary to asthma and is been found to have severe hypokalemia likely secondary to a combination of Lasix and metolazone. This morning she noted blistering and leaking fluid on the posterior part of her left leg. She called our intake nurse urgently and we was saw her this afternoon. She has not had any real discomfort here. I don't know that she's been wearing any stockings on this leg for at least 2-3 days. ABIs in this clinic were 1.21 on the right and 1.3 on the left. She is previously seen vascular surgery who does not think that there is a peripheral arterial issue. 01/30/17; Patient arrives with no open wound on the left leg. She has been to elastic therapy and obtained 20-32mmhg below knee stockings and she has one on the right leg today. READMISSION 02/19/18; this Sindoni is  a now 73 year old patient we've had in this clinic perhaps 3 times before. I had last looked at her from January 07 December 2016 with an area on the medial left leg. We discharged her on 12/25/16 however she had to be readmitted on 01/22/17 with a recurrence. I have in my notes that we discharged her on 20-30 mm stockings although she tells me she was only wearing support hose because she cannot get stockings on predominantly related to her cervical spine surgery/issues. She has had previous ablations done by vein and vascular in [place] including a great saphenous vein ablation on the left with an anterior accessory branch ablation I think both of these were in 2016. On one of the previous visit she had a biopsy noted 2009 that was negative. She is not felt to have an arterial issue. She is not a diabetic. She does have a history of obstructive sleep apnea hypertension asthma as well as chronic venous insufficiency and  lymphedema. On this occasion she noted 2 dry scaly patch on her left leg. She tried to put lotion on this it didn't really help. There were 2 open areas.the patient has been seeing her primary physician from 02/05/18 through 02/14/18. She had Unna boots applied. The superior wound now on the lateral left leg has closed but she's had one wound that remains open on the lateral left leg. This is not the same spot as we dealt with in 2018. ABIs in this clinic were 1.3 bilaterally 02/26/18; patient has a small wound on the left lateral calf. Dimensions are down. She has chronic venous insufficiency and lymphedema. 03/05/18; small open area on the left lateral calf. Dimensions are down. Tightly adherent necrotic debris over the surface of the wound which was difficult to remove. Also the dressing [over collagen] stuck to the wound surface. This was removed with some difficulty as well. Change the primary dressing to Hydrofera Blue ready 03/12/18; small open area on the left lateral calf. Comes in with tightly adherent surface eschar as well as some adherent Hydrofera Blue. 03/19/18; open area on the left lateral calf. Again adherent surface eschar as well as some adherent Hydrofera Blue nonviable subcutaneous tissue. She complained of pain all week even with the reduction from 4-3 layer compression I put on last week. Also she had an increase in her ankle and calf measurements probably related to the same thing. 03/26/18; open area on the left lateral calf. A very small open area remains here. We used silver alginate starting last week as the Hydrofera Blue seem to stick to the wound bed. In using 4-layer compression 04/02/18; the open area in the left lateral calf at some adherent slough which I removed there is no open area here. We are able to transition her into her own compression stocking. Truthfully I think this is probably his support hose. However this does not maintain skin integrity will be limited.  She cannot put over the toe compression stockings on because of neck problems hand problems etc. She is allergic to the lining layer of juxta lites. We might be forced to use extremitease stocking should this fail READMIT 11/24/2018 Patient is now a 73 year old woman who is not a diabetic. She has been in this clinic on at least 3 previous occasions largely with recurrent wounds on her left leg secondary to chronic venous insufficiency with secondary lymphedema. Her situation is complicated by inability to get stockings on and an allergy to neoprene which is apparently a component and  at least juxta lites and other stockings. As a result she really has not been wearing any stockings on her legs. She tells [place] that roughly 2 or 3 weeks ago she started noticing a stinging sensation just above her ankle on the left medial aspect. She has been diagnosed with pseudogout and she wondered whether this was what she was experiencing. She tried to dress this with something she bought at the store however subsequently it pulled skin off and now she has an open wound that is not improving. She has been using Vaseline gauze with a cover bandage. She saw her primary doctor last week who put an Haematologist on her. ABIs in this clinic was 1.03 on the left [name], [name]. ([id number]) 2/12; the area is on the left medial ankle. Odd-looking wound with what looks to be surface epithelialization but a multitude of small petechial openings. This clearly not closed yet. We have been using silver alginate under 3 layer compression with TCA 2/19; the wound area did not look quite as good this week. Necrotic debris over the majority of the wound surface which required debridement. She continues to have a multitude of what looked to be small petechial openings. She reminds [place] that she had a biopsy on this initially during her first outbreak in 2015 in [place] [facility]. She expresses concern about this being a possible  melanoma. 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 I assured her that this does not look anything like melanoma. Except for the petechial reaction it does look like a venous insufficiency area and she certainly has evidence of this on both sides 2/26; a difficult area on the left medial ankle. The patient clearly has chronic venous hypertension with some degree of lymphedema. The odd thing about the area is the small petechial hemorrhages. I am not really sure how to explain this. This was present last time and this is not a compression injury. We have been using Hydrofera Blue which I changed to last week 3/4; still using Hydrofera Blue. Aggressive debridement today. She does not have known arterial issues. She has seen Dr. Kellie Simmering at [facility] vein [facility] vascular and and has an ablation on the left. [Anterior accessory branch of the greater saphenous]. From what I remember they did not feel she had an arterial issue. The patient has had this area biopsied in 2009 at [facility]ology and by her recollection they said this was "stasis". She is also follow-up with dermatology locally who thought that this was more of a vascular issue 3/11; using Hydrofera Blue. Aggressive debridement today. She does not have an arterial issue. We are using 3 layer compression although we may need to go to 4. The patient has been in for multiple changes to her wrap since I last saw her a week ago. She says that the area was leaking. I do not have too much more information on what was found 01/19/19 on evaluation today patient was actually being seen for a nurse visit when unfortunately she had the area on her left lateral lower extremity as well as weeping from the right lower extremity that became apparent. Therefore we did end up actually seeing her for a full visit with myself. She is having some pain at this site as well  but fortunately nothing too significant at this point. No fevers, chills, nausea, or vomiting noted at this time. 3/18-Patient is back to the  clinic with the left leg venous leg ulcer, the ulcer is larger in size, has a surface that is densely adherent with fibrinous tissue, the Hydrofera Blue was used but is densely adherent and there was difficulty in removing it. The right lower extremity was also wrapped for weeping edema. Patient has a new area over the left lateral foot above the malleolus that is small and appears to have no debris with intact surrounding skin. Patient is on increased dose of Lasix also as a means to edema management 3/25; the patient has a nonhealing venous ulcer on the medial left leg and last week developed a smaller area on the lateral left calf. We have been using Hydrofera Blue with a contact layer. 4/1; no major change in these wounds areas. Left medial and more recently left lateral calf. I tried Iodoflex last week to aid in debridement she did not tolerate this. She stated her pain was terrible all week. She took the top layer of the 4 layer compression off. 4/8; the patient actually looks somewhat better in terms of her more prominent left lateral calf wound. There is some healthy looking tissue here. She is still complaining of a lot of discomfort. 4/15; patient in a lot of pain secondary to sciatica. She is on a prednisone taper prescribed by her primary physician. She has the 2 areas one on the left medial and more recently a smaller area on the left lateral calf. Both of these just above the malleoli 4/22; her back pain is better but she still states she is very uncomfortable and now feels she is intolerant to the [employer]. No real change in the wounds we have been using Sorbact. She has been previously intolerant to Iodoflex. There is not a lot of option about what we can use to debride this wound under compression that she no doubt needs. sHe states Ultram  no longer works for her pain 4/29; no major change in the wounds slightly increased depth. Surface on the original medial wound perhaps somewhat improved however the more recent area on the lateral left ankle is 100% covered in very adherent debris we have been using Sorbact. She tolerates 4 layer compression well and her edema control is a lot better. She has not had to come in for a nurse check 5/6; no major change in the condition of the wounds. She did consent to debridement today which was done with some difficulty. Continuing Sorbact. She did not tolerate Iodoflex. She was in for a check of her compression the day after we wrapped her last week this was adjusted but nothing much was found 5/13; no major change in the condition or area of the wounds. I was able to get a fairly aggressive debridement done on the lateral left leg wound. Even using Sorbact under compression. She came back in on Friday to have the wrap changed. She says she felt uncomfortable on the lateral aspect of her ankle. She has a long history of chronic venous insufficiency including previous ablation surgery on this side. 5/20-Patient returns for wounds on left leg with both wounds covered in slough, with the lateral leg wound larger in size, she has been in 3 layer compression and felt more comfortable, she describes pain in ankle, in leg and pins and needles in foot, and is about to try Pamelor for this 6/3; wounds on the left lateral and left medial leg. The area medially which is the most recent of the 2 seems to have  had the largest increase in dimensions. We have been using Sorbac to try and debride the surface. She has been to see orthopedics [name], [name] they apparently did a plain x-ray that was indeterminant. Diagnosed her with neuropathy and they have ordered an MRI to determine if there is underlying osteomyelitis. This was not high on my thought list but I suppose it is prudent. We  have advised her to make an appointment with vein and vascular in [place]. She has a history of a left greater saphenous and accessory vein ablations I wonder if there is anything else that can be done from a surgical point of view to help in these difficult refractory wounds. We have previously healed this wound on one occasion but it keeps on reopening [medial side] 6/10; deep tissue culture I did last week I think on the left medial wound showed both moderate E. coli and moderate staph aureus [MSSA]. She is going to require antibiotics and I have chosen Augmentin. We have been using Sorbact and we have made better looking wound surface on both sides but certainly no improvement in wound area. She was back in last Friday apparently for a dressing changes the wrap was hurting her outer left ankle. She has not managed to get a hold of vein and vascular in [place]. We are going to have to make her that appointment 6/17; patient is tolerating the Augmentin. She had an MRI that I think was ordered by orthopedic surgeon this did not show osteomyelitis or an abscess did suggest cellulitis. We have been using Sorbact to the lateral and medial ankles. We have been trying to arrange a follow-up appointment with vein and vascular in [place] or did her original ablations. We apparently an area sent the request to vein and vascular in [facility] 6/24; patient has completed the Augmentin. We do not yet have a vein and vascular appointment in [place]. I am not sure what the issue is here we have asked her to call tomorrow. We are using Sorbact. Making some improvements and especially the medial wound. Both surfaces however look better medial and lateral. 7/1; the patient has been in contact with vein and vascular in [place] but has not yet received an appointment. Using Sorbact we have gradually improve the wound surface with no improvement in surface area. She is approved for Apligraf but the  wound surface still is not completely viable. She has not had to come in for a dressing change 7/8; the patient has an appointment with vein and vascular on 7/31 which is a Friday afternoon. She is concerned about getting back here for [place] to dress her wounds. I think it is important to have them goal for her venous reflux/history of ablations etc. to see if anything else can be done. She apparently tested positive for 1 of the blood tests with regards to lupus and saw a rheumatologist. He has raised the issue of vasculitis again. I have had this thought in the past however the evidence seems overwhelming that this is a venous reflux etiology. If the rheumatologist tells me there is clinical and laboratory investigation is positive for lupus I will rethink this. 7/15; the patient's wound surfaces are quite a bit better. The medial area which was her original wound now has no depth although the lateral wound which was the more recent area actually appears larger. Both with viable surfaces which is indeed better. Using Sorbact. I wanted to use Apligraf on her however there is the  issue of the vein and vascular appointment on 7/31 at 2:00 in the afternoon which would not allow her to get back to be rewrapped and they would no doubt remove the graft 7/22; the patient's wound surfaces have moderate amount of debris although generally look better. The lateral one is larger with 2 small satellite areas superiorly. We are waiting for her vein and vascular appointment on 7/31. She has been approved for Apligraf which I would like to use after th 7/29; wound surfaces have improved no debridement is required we have been using Sorbact. She sees vein and vascular on Friday with this so question of whether anything can be done to lessen the likelihood of recurrence and/or speed the healing of these areas. She is already had previous ablations. She no doubt has severe venous hypertension 8/5-Patient returns at 1  week, she was in [place] for 3 days by her podiatrist, we have been using so backed to the wound, she has increased pain in both the wounds on the left lower leg especially the more distal one on the lateral aspect 8/12-Patient returns at 1 week and she is agreeable to having debridement in both wounds on her left leg today. We have been using Sorbact, and vascular studies were reviewed at last visit 8/19; the patient arrives with her wounds fairly clean and no debridement is required. We have used Sorbact which is really done a nice job in cleaning up these very difficult wound surfaces. The patient saw Dr. Donzetta Matters of vascular surgery on 7/31. He did not feel that there was an arterial component. He felt that her treated greater saphenous vein is adequately addressed and that the small saphenous vein did not appear to be involved significantly. She was also noted to have deep venous reflux which is not treatable. Dr. Donzetta Matters mentioned the possibility of a central obstructive component leading to reflux and he offered her central venography. She wanted to discuss this or think about it. I have urged her to go ahead with this. She has had recurrent difficult wounds in these areas which do heal but after months in the clinic. If there is anything that can be done to reduce the likelihood of this I think it is worth it. 9/2 she is still working towards getting follow-up with Dr. Donzetta Matters to schedule her CT. Things are quite a bit worse venography. I put Apligraf on 2 weeks ago on both wounds on the medial and lateral part of her left lower leg. She arrives in clinic today with 3 superficial additional wounds above the area laterally and one below the wound medially. She describes a lot of discomfort. I think these are probably wrapped injuries. Does not look like she has cellulitis. 07/20/2019 on evaluation today patient appears to be doing somewhat poorly in regard to her lower extremity ulcers. She in fact  showed signs of erythema in fact we may even be dealing with an infection at this time. Unfortunately I am unsure if this is just infection or if indeed there may be some allergic reaction that occurred as a result of the Apligraf application. With that being said that would be unusual but nonetheless not impossible in this patient is one who is unfortunately allergic to quite a bit. Currently we have been using the Sorbact which seems to do as well as anything for her. I do think we may want to obtain [name], [name] a culture today to see if there is anything showing up  there that may need to be addressed. 9/16; noted that last week the wounds look worse in 1 week follow-up of the Apligraf. Using Sorbact as of 2 days ago. She arrives with copious amounts of drainage and new skin breakdown on the back of the left calf. The wounds arm more substantial bilaterally. There is a fair amount of swelling in the left calf no overt DVT there is edema present I think in the left greater than right thigh. She is supposed to go on 9/28 for CT venography. The wounds on the medial and lateral calf are worse and she has new skin breakdown posteriorly at least new for me. This is almost developing into a circumferential wound area The Apligraf was taken off last week which I agree with things are not going in the right direction a culture was done we do not have that back yet. She is on Augmentin that she started 2 days ago 9/23; dressing was changed by her nurses on Monday. In general there is no improvement in the wound areas although the area looks less angry than last week. She did get Augmentin for MSSA cultured on the 14th. She still appears to have too much swelling in the left leg even with 3 layer compression 9/30; the patient underwent her procedure on 9/28 by Dr. Donzetta Matters at vascular and vein specialist. She was discovered to have the common iliac vein measuring 12.2 mm but at the level of  L4-L5 measured 3 mm. After stenting it measured 10 mm. 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 Rouleaux flow in the common femoral and femoral vein was observed much improved after stenting. We are using silver alginate to the wounds on the medial and lateral ankle on the left. 4 layer compression 10/7; the patient had fluid swelling around her knee and 4 layer compression. At the advice of vein and vascular this was reduced to 3 layer which she is tolerating better. We have been using silver alginate under 3 layer compression since last Friday 10/14; arrives with the areas on the left ankle looking a lot better. Inflammation in the area also a lot better. She came in for a nurse check on 10/9 10/21; continued nice improvement. Slight improvements in surface area of both the medial and lateral wounds on the left. A lot of the satellite lesions in the weeping erythema around these from stasis dermatitis is resolved. We have been using silver alginate 10/28; general improvement in the entire wound areas although not a lot of change in dimensions the wound certainly looks better. There is a lot less in terms of venous inflammation. Continue silver alginate this week however look towards Hydrofera Blue next week 11/4; very adherent debris on the medial wound left wound is not as bad. We have been using silver alginate. Change to Hydrofera Blue today Objective Constitutional Sitting or standing Blood Pressure is within target range for patient.. Pulse regular and within target range for patient.Marland Kitchen Respirations regular, non-labored and within target range.. Temperature is normal and within the target range for the patient.Marland Kitchen appears in no distress. Vitals Time Taken: 12:50 PM, Height: 63 in, Weight: 224.7 lbs, BMI: 39.8, Temperature: 98.2 F, Pulse: 87 bpm, Respiratory Rate: 16 breaths/min, Blood Pressure: 141/71 mmHg. Respiratory Respiratory effort is easy and symmetric  bilaterally. Rate is normal at rest and on room air.. Cardiovascular Pedal pulses palpable at the left dorsalis pedis and posterior tibial. [name], [name]. ([id number]) General Notes: Wound exam; we are  back to the 2 original wounds on the left medial and left lateral. Left medial did not have a viable surface. Debrided with a #5 curette to healthy granulation tissue the left lateral required no debridement. Integumentary (Hair, Skin) Wound #5 status is Open. Original cause of wound was Gradually Appeared. The wound is located on the Left,Medial Lower Leg. The wound measures 4.5cm length x 2.5cm width x 0.2cm depth; 8.836cm^2 area and 1.767cm^3 volume. There is Fat Layer (Subcutaneous Tissue) Exposed exposed. There is no tunneling or undermining noted. There is a large amount of serosanguineous drainage noted. The wound margin is flat and intact. There is medium (34-66%) pale granulation within the wound bed. There is a medium (34-66%) amount of necrotic tissue within the wound bed including Adherent Slough. Wound #6 status is Open. Original cause of wound was Gradually Appeared. The wound is located on the Left,Lateral Lower Leg. The wound measures 5.5cm length x 2.2cm width x 0.2cm depth; 9.503cm^2 area and 1.901cm^3 volume. There is Fat Layer (Subcutaneous Tissue) Exposed exposed. There is no tunneling or undermining noted. There is a large amount of serosanguineous drainage noted. The wound margin is flat and intact. There is small (1-33%) pink granulation within the wound bed. There is a large (67-100%) amount of necrotic tissue within the wound bed including Adherent Slough. Wound #9 status is Open. Original cause of wound was Gradually Appeared. The wound is located on the Left,Lateral,Posterior Lower Leg. The wound measures 1.4cm length x 2.4cm width x 0.1cm depth; 2.639cm^2 area and 0.264cm^3 volume. There is Fat Layer (Subcutaneous Tissue) Exposed exposed. There is no tunneling or  undermining noted. There is a large amount of serosanguineous drainage noted. The wound margin is flat and intact. There is large (67- 100%) pink granulation within the wound bed. There is a small (1-33%) amount of necrotic tissue within the wound bed including Adherent Slough. Assessment Active Problems ICD-10 Non-pressure chronic ulcer of left calf limited to breakdown of skin Chronic venous hypertension (idiopathic) with inflammation of right lower extremity Lymphedema, not elsewhere classified Procedures Wound #5 Pre-procedure diagnosis of Wound #5 is a Lymphedema located on the Left,Medial Lower Leg . There was a Excisional Skin/Subcutaneous Tissue Debridement with a total area of 11.25 sq cm performed by [name] Dillon, MD. With the following instrument(s): Curette to remove Viable and Non-Viable tissue/material. Material removed includes Subcutaneous Tissue and Slough and after achieving pain control using Lidocaine. No specimens were taken. A time out was conducted at 13:15, prior to the start of the procedure. A Minimum amount of bleeding was controlled with Pressure. The procedure was tolerated well. Post Debridement Measurements: 4.5cm length x 2.5cm width x 0.3cm depth; 2.651cm^3 volume. Character of Wound/Ulcer Post Debridement is stable. Post procedure Diagnosis Wound #5: Same as Pre-Procedure [name], [name] Plan Wound Cleansing: Wound #5 Left,Medial Lower Leg: Cleanse wound with mild soap and water Wound #6 Left,Lateral Lower Leg: Cleanse wound with mild soap and water Wound #9 Left,Lateral,Posterior Lower Leg: Cleanse wound with mild soap and water Anesthetic (add to Medication List): Wound #5 Left,Medial Lower Leg: Topical Lidocaine 4% cream applied to wound bed prior to debridement (In Clinic Only). Wound #6 Left,Lateral Lower Leg: Topical Lidocaine 4% cream applied to wound bed prior to debridement (In Clinic Only). Wound #9 Left,Lateral,Posterior  Lower Leg: Topical Lidocaine 4% cream applied to wound bed prior to debridement (In Clinic Only). Skin Barriers/Peri-Wound Care: Wound #5 Left,Medial Lower Leg: Triamcinolone Acetonide Ointment (TCA) Wound #6 Left,Lateral Lower Leg: Triamcinolone  Acetonide Ointment (TCA) Wound #9 Left,Lateral,Posterior Lower Leg: Triamcinolone Acetonide Ointment (TCA) Primary Wound Dressing: Wound #5 Left,Medial Lower Leg: Hydrafera Blue Ready Transfer Wound #6 Left,Lateral Lower Leg: Hydrafera Blue Ready Transfer Wound #9 Left,Lateral,Posterior Lower Leg: Hydrafera Blue Ready Transfer Secondary Dressing: Wound #5 Left,Medial Lower Leg: Drawtex Wound #6 Left,Lateral Lower Leg: Drawtex Wound #9 Left,Lateral,Posterior Lower Leg: Drawtex Dressing Change Frequency: Wound #5 Left,Medial Lower Leg: Change dressing every week Other: - as needed. Wound #6 Left,Lateral Lower Leg: Change dressing every week Other: - as needed. Wound #9 Left,Lateral,Posterior Lower Leg: Change dressing every week Other: - as needed. Follow-up Appointments: Wound #5 Left,Medial Lower Leg: Return Appointment in 1 week. Nurse Visit as needed - Call of needed Wound #6 Left,Lateral Lower Leg: Return Appointment in 1 week. Nurse Visit as needed - Call of needed Wound #9 Left,Lateral,Posterior Lower Leg: Return Appointment in 1 week. Nurse Visit as needed - Call of needed Edema Control: [name], [name] Wound #5 Left,Medial Lower Leg: 3 Layer Compression System - Left Lower Extremity - unna to anchor Wound #6 Left,Lateral Lower Leg: 3 Layer Compression System - Left Lower Extremity - unna to anchor Wound #9 Left,Lateral,Posterior Lower Leg: 3 Layer Compression System - Left Lower Extremity - unna to anchor 1. Change the primary dressing to both wounds to [facility] Blue hopefully to help with some ongoing debridement and finally some improvement in surface area Electronic Signature(s) Signed: 09/11/2019  7:47:21 AM By: Linton Ham MD Entered By: Linton Ham on 09/09/2019 13:39:42 Blacklock, [name] ([id number]) -------------------------------------------------------------------------------- SuperBill Details Patient Name: SAMYIA, TARAN. Date of Service: 09/09/2019 Medical Record Number: [id number] Patient Account Number: [id number] Date of Birth/Sex: 1946/07/14 (73 y.o. F) Treating RN: Cornell Barman Primary Care Provider: [name] Other Clinician: Referring Provider: [name] Treating Provider/Extender: Tito Dine in Treatment: 39 Diagnosis Coding ICD-10 Codes Code Description [phone] Non-pressure chronic ulcer of left calf limited to breakdown of skin I87.321 Chronic venous hypertension (idiopathic) with inflammation of right lower extremity I89.0 Lymphedema, not elsewhere classified Facility Procedures CPT4 Code: [id number] Description: [id number] - DEB SUBQ TISSUE 20 SQ CM/< ICD-10 Diagnosis Description L97.221 Non-pressure chronic ulcer of left calf limited to breakdown Modifier: of skin Quantity: 1 Physician Procedures CPT4 Code: [id number] Description: [id number] - WC PHYS SUBQ TISS 20 SQ CM ICD-10 Diagnosis Description L97.221 Non-pressure chronic ulcer of left calf limited to breakdown Modifier: of skin Quantity: 1 Electronic Signature(s) Signed: 09/11/2019 7:47:21 AM By: Linton Ham MD Entered By: Linton Ham on 09/09/2019 13:40:54

## 2019-09-11 NOTE — Progress Notes (Signed)
[name], [name] Visit Report for 09/09/2019 Arrival Information Details Patient Name: [name], [name]. Date of Service: 09/09/2019 12:45 PM Medical Record Number: [id number] Patient Account Number: [id number] Date of Birth/Sex: 1946-09-01 (73 y.o. F) Treating RN: Cornell Barman Primary Care Dariah Mcsorley: [name] Other Clinician: Referring Raj Landress: [name] Treating Adahlia Stembridge/Extender: Tito Dine in Treatment: 24 Visit Information History Since Last Visit Added or deleted any medications: No Patient Arrived: Ambulatory Any new allergies or adverse reactions: No Arrival Time: 12:50 Had a fall or experienced change in No Accompanied By: self activities of daily living that may affect Transfer Assistance: None risk of falls: Patient Identification Verified: Yes Signs or symptoms of abuse/neglect since last visito No Secondary Verification Process Yes Hospitalized since last visit: No Completed: Implantable device outside of the clinic excluding No Patient Requires Transmission-Based No cellular tissue based products placed in the center Precautions: since last visit: Patient Has Alerts: Yes Has Dressing in Place as Prescribed: Yes Patient Alerts: Patient on Blood Pain Present Now: No Thinner aspirin 81 Electronic Signature(s) Signed: 09/10/2019 10:34:41 AM By: Lorine Bears RCP, RRT, CHT Entered By: Lorine Bears on 09/09/2019 12:50:47 Dehner, [name] [name] -------------------------------------------------------------------------------- Encounter Discharge Information Details Patient Name: [name], [name]. Date of Service: 09/09/2019 12:45 PM Medical Record Number: [id number] Patient Account Number: [id number] Date of Birth/Sex: January 30, 1946 (73 y.o. F) Treating RN: Cornell Barman Primary Care Brylen Wagar: [name] Other Clinician: Referring Ahmar Pickrell: [name] Treating Tirrell Buchberger/Extender: Tito Dine in Treatment: 71 Encounter Discharge Information Items Post Procedure Vitals Ambulatory Status: Ambulatory Temperature (F): 98.2 Discharge Destination: Home Pulse (bpm): 87 Transportation: Private Auto Respiratory Rate (breaths/min): 16 Accompanied By: self Blood Pressure (mmHg): 141/71 Schedule Follow-up Appointment: No Clinical Summary of Care: Electronic Signature(s) Signed: 09/10/2019 5:19:38 PM By: [name], [name], RN, CWS, [name], [name] Entered By: [name], [name], RN, CWS, Kim on 09/09/2019 13:23:56 [name] -------------------------------------------------------------------------------- Lower Extremity Assessment Details Patient Name: [name], [name]. Date of Service: 09/09/2019 12:45 PM Medical Record Number: [id number] Patient Account Number: [id number] Date of Birth/Sex: 05-Mar-1946 (73 y.o. F) Treating RN: Army Melia Primary Care Brandan Robicheaux: [name] Other Clinician: Referring Mazi Brailsford: [name] Treating Cedricka Sackrider/Extender: Tito Dine in Treatment: 39 Edema Assessment Assessed: [Left: No] [Right: No] Edema: [Left: N] [Right: o] Calf Left: Right: Point of Measurement: 31 cm From Medial Instep 44 cm cm Ankle Left: Right: Point of Measurement: 12 cm From Medial Instep 24 cm cm Vascular Assessment Pulses: Dorsalis Pedis Palpable: [Left:Yes] Electronic Signature(s) Signed: 09/09/2019 4:18:06 PM By: Army Melia Entered By: Army Melia on 09/09/2019 12:59:20 Saric, [name] [name] -------------------------------------------------------------------------------- Multi Wound Chart Details Patient Name: [name], [name]. Date of Service: 09/09/2019 12:45 PM Medical Record Number: [id number] Patient Account Number: [id number] Date of Birth/Sex: 06-19-1946 (73 y.o. F) Treating RN: Cornell Barman Primary Care Binyamin Nelis: [name] Other Clinician: Referring Maximiliano Cromartie: [name] Treating Stasia Somero/Extender:  Tito Dine in Treatment: 78 Vital Signs Height(in): 63 Pulse(bpm): 73 Weight(lbs): 224.7 Blood Pressure(mmHg): 141/71 Body Mass Index(BMI): 40 Temperature(F): 98.2 Respiratory Rate 16 (breaths/min): Photos: Wound Location: Left Lower Leg - Medial Left Lower Leg - Lateral Left Lower Leg - Lateral, Posterior Wounding Event: Gradually Appeared Gradually Appeared Gradually Appeared Primary Etiology: Lymphedema Venous Leg Ulcer Venous Leg Ulcer Comorbid History: Cataracts, Asthma, Sleep Cataracts, Asthma, Sleep Cataracts, Asthma, Sleep Apnea, Deep Vein Apnea, Deep Vein Apnea, Deep Vein Thrombosis, Hypertension, Thrombosis, Hypertension, Thrombosis, Hypertension, Peripheral Venous Disease, Peripheral Venous Disease, Peripheral Venous Disease,  Osteoarthritis, Received Osteoarthritis, Received Osteoarthritis, Received Chemotherapy, Received Chemotherapy, Received Chemotherapy, Received Radiation Radiation Radiation Date Acquired: 11/19/2018 01/19/2019 07/08/2019 Weeks of Treatment: 39 33 9 Wound Status: Open Open Open Measurements L x W x D 4.5x2.5x0.2 5.5x2.2x0.2 1.4x2.4x0.1 (cm) Area (cm) : 8.836 9.503 2.639 Volume (cm) : 1.767 1.901 0.264 % Reduction in Area: -3.40% -4219.50% -367.10% % Reduction in Volume: -106.70% -8540.90% -363.20% Classification: Full Thickness Without Full Thickness Without Full Thickness Without Exposed Support Structures Exposed Support Structures Exposed Support Structures Exudate Amount: Large Large Large Exudate Type: Serosanguineous Serosanguineous Serosanguineous Exudate Color: red, brown red, brown red, brown Wound Margin: Flat and Intact Flat and Intact Flat and Intact Granulation Amount: Medium (34-66%) Small (1-33%) Large (67-100%) Granulation Quality: Pale Pink Pink Necrotic Amount: Medium (34-66%) Large (67-100%) Small (1-33%) Grosch, Winry J. ([id number]) Exposed Structures: Fat Layer (Subcutaneous Fat Layer (Subcutaneous Fat Layer  (Subcutaneous Tissue) Exposed: Yes Tissue) Exposed: Yes Tissue) Exposed: Yes Fascia: No Fascia: No Fascia: No Tendon: No Tendon: No Tendon: No Muscle: No Muscle: No Muscle: No Joint: No Joint: No Joint: No Bone: No Bone: No Bone: No Epithelialization: None None None Debridement: Debridement - Excisional N/A N/A Pre-procedure 13:15 N/A N/A Verification/Time Out Taken: Pain Control: Lidocaine N/A N/A Tissue Debrided: Subcutaneous, Slough N/A N/A Level: Skin/Subcutaneous Tissue N/A N/A Debridement Area (sq cm): 11.25 N/A N/A Instrument: Curette N/A N/A Bleeding: Minimum N/A N/A Hemostasis Achieved: Pressure N/A N/A Debridement Treatment Procedure was tolerated well N/A N/A Response: Post Debridement 4.5x2.5x0.3 N/A N/A Measurements L x W x D (cm) Post Debridement Volume: 2.651 N/A N/A (cm) Procedures Performed: Debridement N/A N/A Treatment Notes Wound #5 (Left, Medial Lower Leg) Notes TCA, hyrafera blue, drawtex, abd, 3 layer wrap with unna to anchor Wound #6 (Left, Lateral Lower Leg) Notes TCA, hyrafera blue, drawtex, abd, 3 layer wrap with unna to anchor Wound #9 (Left, Lateral, Posterior Lower Leg) Notes TCA, hyrafera blue, drawtex, abd, 3 layer wrap with unna to anchor Electronic Signature(s) Signed: 09/11/2019 7:47:21 AM By: Linton Ham MD Entered By: Linton Ham on 09/09/2019 13:36:19 Demas, [name] [name] -------------------------------------------------------------------------------- Multi-Disciplinary Care Plan Details Patient Name: [name], [name]. Date of Service: 09/09/2019 12:45 PM Medical Record Number: [id number] Patient Account Number: [id number] Date of Birth/Sex: [birth year] (73 y.o. F) Treating RN: Cornell Barman Primary Care Sasha Rogel: [name] Other Clinician: Referring Krisha Beegle: [name] Treating Tyshauna Finkbiner/Extender: Tito Dine in Treatment: 72 Active Inactive Orientation to the Wound Care  Program Nursing Diagnoses: Knowledge deficit related to the wound healing center program Goals: Patient/caregiver will verbalize understanding of the [place] Program Date Initiated: 12/10/2018 Target Resolution Date: 01/09/2019 Goal Status: Active Interventions: Provide education on orientation to the wound center Notes: Soft Tissue Infection Nursing Diagnoses: Impaired tissue integrity Goals: Patient's soft tissue infection will resolve Date Initiated: 12/10/2018 Target Resolution Date: 01/09/2019 Goal Status: Active Interventions: Assess signs and symptoms of infection every visit Notes: Venous Leg Ulcer Nursing Diagnoses: Actual venous Insuffiency (use after diagnosis is confirmed) Goals: Patient will maintain optimal edema control Date Initiated: 12/10/2018 Target Resolution Date: 01/09/2019 Goal Status: Active Interventions: Assess peripheral edema status every visit. [name], [name] Treatment Activities: Therapeutic compression applied : 12/10/2018 Notes: Wound/Skin Impairment Nursing Diagnoses: Impaired tissue integrity Goals: Patient/caregiver will verbalize understanding of skin care regimen Date Initiated: 12/10/2018 Target Resolution Date: 01/09/2019 Goal Status: Active Interventions: Assess ulceration(s) every visit Treatment Activities: Topical wound management initiated : 12/10/2018 Notes: Electronic Signature(s) Signed: 09/10/2019 5:19:38 PM By: [name], [name], RN, CWS, Kim  RN, [name] Entered By: [name], [name], RN, CWS, Kim on 09/09/2019 13:17:01 [name], [name] -------------------------------------------------------------------------------- Pain Assessment Details Patient Name: [name], [name]. Date of Service: 09/09/2019 12:45 PM Medical Record Number: [id number] Patient Account Number: [id number] Date of Birth/Sex: 06/10/1946 (73 y.o. F) Treating RN: Cornell Barman Primary Care [name] Dec: [name] Other Clinician: Referring  Alvey Brockel: [name] Treating Ana Liaw/Extender: Tito Dine in Treatment: 39 Active Problems Location of Pain Severity and Description of Pain Patient Has Paino No Site Locations Pain Management and Medication Current Pain Management: Electronic Signature(s) Signed: 09/10/2019 10:34:41 AM By: Lorine Bears RCP, RRT, CHT Signed: 09/10/2019 5:19:38 PM By: [name], [name], RN, CWS, [name], [name] Entered By: Lorine Bears on 09/09/2019 12:51:08 [name], [name] -------------------------------------------------------------------------------- Patient/Caregiver Education Details Patient Name: [name], [name]. Date of Service: 09/09/2019 12:45 PM Medical Record Number: [id number] Patient Account Number: [id number] Date of Birth/Gender: 1946/03/25 (73 y.o. F) Treating RN: Cornell Barman Primary Care Physician: [name] Other Clinician: Referring Physician: [name] Treating Physician/Extender: Tito Dine in Treatment: 52 Education Assessment Education Provided To: Patient Education Topics Provided Wound/Skin Impairment: Handouts: Caring for Your Ulcer Methods: Demonstration, Explain/Verbal Responses: State content correctly Electronic Signature(s) Signed: 09/10/2019 5:19:38 PM By: [name], [name], RN, CWS, [name], [name] Entered By: [name], [name], RN, CWS, Kim on 09/09/2019 13:22:34 [name] ([id number]) -------------------------------------------------------------------------------- Wound Assessment Details Patient Name: [name], [name]. Date of Service: 09/09/2019 12:45 PM Medical Record Number: [id number] Patient Account Number: [id number] Date of Birth/Sex: 07-02-1946 (73 y.o. F) Treating RN: Army Melia Primary Care Jerome Otter: [name] Other Clinician: Referring Hazen Brumett: [name] Treating Margherita Collyer/Extender: Tito Dine in Treatment: 39 Wound Status Wound Number: 5 Primary  Lymphedema Etiology: Wound Location: Left Lower Leg - Medial Wound Open Wounding Event: Gradually Appeared Status: Date Acquired: 11/19/2018 Comorbid Cataracts, Asthma, Sleep Apnea, Deep Vein Weeks Of Treatment: 39 History: Thrombosis, Hypertension, Peripheral Venous Clustered Wound: No Disease, Osteoarthritis, Received Chemotherapy, Received Radiation Photos Wound Measurements Length: (cm) 4.5 Width: (cm) 2.5 Depth: (cm) 0.2 Area: (cm) 8.836 Volume: (cm) 1.767 % Reduction in Area: -3.4% % Reduction in Volume: -106.7% Epithelialization: None Tunneling: No Undermining: No Wound Description Full Thickness Without Exposed Support Classification: Structures Wound Margin: Flat and Intact Exudate Large Amount: Exudate Type: Serosanguineous Exudate Color: red, brown Foul Odor After Cleansing: No Slough/Fibrino Yes Wound Bed Granulation Amount: Medium (34-66%) Exposed Structure Granulation Quality: Pale Fascia Exposed: No Necrotic Amount: Medium (34-66%) Fat Layer (Subcutaneous Tissue) Exposed: Yes Necrotic Quality: Adherent Slough Tendon Exposed: No Muscle Exposed: No Joint Exposed: No Bone Exposed: No Stmarie, Wendelyn J. ([id number]) Treatment Notes Wound #5 (Left, Medial Lower Leg) Notes TCA, hyrafera blue, drawtex, abd, 3 layer wrap with unna to anchor Electronic Signature(s) Signed: 09/09/2019 4:18:06 PM By: Army Melia Entered By: Army Melia on 09/09/2019 12:57:31 Lagrand, [name] [name] -------------------------------------------------------------------------------- Wound Assessment Details Patient Name: MYIA, KEGEL. Date of Service: 09/09/2019 12:45 PM Medical Record Number: [id number] Patient Account Number: [id number] Date of Birth/Sex: 1946-01-16 (73 y.o. F) Treating RN: Army Melia Primary Care Holdan Stucke: [name] Other Clinician: Referring Venice Marcucci: [name] Treating Tacey Dimaggio/Extender: Tito Dine in Treatment:  39 Wound Status Wound Number: 6 Primary Venous Leg Ulcer Etiology: Wound Location: Left Lower Leg - Lateral Wound Open Wounding Event: Gradually Appeared Status: Date Acquired: 01/19/2019 Comorbid Cataracts, Asthma, Sleep Apnea, Deep Vein Weeks Of Treatment: 33 History: Thrombosis, Hypertension, Peripheral Venous Clustered Wound: No Disease, Osteoarthritis, Received Chemotherapy, Received Radiation Photos Wound  Measurements Length: (cm) 5.5 Width: (cm) 2.2 Depth: (cm) 0.2 Area: (cm) 9.503 Volume: (cm) 1.901 % Reduction in Area: -4219.5% % Reduction in Volume: -8540.9% Epithelialization: None Tunneling: No Undermining: No Wound Description Full Thickness Without Exposed Support Classification: Structures Wound Margin: Flat and Intact Exudate Large Amount: Exudate Type: Serosanguineous Exudate Color: red, brown Foul Odor After Cleansing: No Slough/Fibrino Yes Wound Bed Granulation Amount: Small (1-33%) Exposed Structure Granulation Quality: Pink Fascia Exposed: No Necrotic Amount: Large (67-100%) Fat Layer (Subcutaneous Tissue) Exposed: Yes Necrotic Quality: Adherent Slough Tendon Exposed: No Muscle Exposed: No Joint Exposed: No Bone Exposed: No Bumgardner, Pamlea J. ([id number]) Treatment Notes Wound #6 (Left, Lateral Lower Leg) Notes TCA, hyrafera blue, drawtex, abd, 3 layer wrap with unna to anchor Electronic Signature(s) Signed: 09/09/2019 4:18:06 PM By: Army Melia Entered By: Army Melia on 09/09/2019 12:58:00 Redel, [name] [name] -------------------------------------------------------------------------------- Wound Assessment Details Patient Name: TENISE, ESTEPP. Date of Service: 09/09/2019 12:45 PM Medical Record Number: [id number] Patient Account Number: [id number] Date of Birth/Sex: [birth year] (73 y.o. F) Treating RN: Army Melia Primary Care Boy Delamater: [name] Other Clinician: Referring Urvi Imes: [name] Treating  Palmer Fahrner/Extender: Tito Dine in Treatment: 39 Wound Status Wound Number: 9 Primary Venous Leg Ulcer Etiology: Wound Location: Left Lower Leg - Lateral, Posterior Wound Open Wounding Event: Gradually Appeared Status: Date Acquired: 07/08/2019 Comorbid Cataracts, Asthma, Sleep Apnea, Deep Vein Weeks Of Treatment: 9 History: Thrombosis, Hypertension, Peripheral Venous Clustered Wound: No Disease, Osteoarthritis, Received Chemotherapy, Received Radiation Photos Wound Measurements Length: (cm) 1.4 Width: (cm) 2.4 Depth: (cm) 0.1 Area: (cm) 2.639 Volume: (cm) 0.264 % Reduction in Area: -367.1% % Reduction in Volume: -363.2% Epithelialization: None Tunneling: No Undermining: No Wound Description Full Thickness Without Exposed Support Classification: Structures Wound Margin: Flat and Intact Exudate Large Amount: Exudate Type: Serosanguineous Exudate Color: red, brown Foul Odor After Cleansing: No Slough/Fibrino Yes Wound Bed Granulation Amount: Large (67-100%) Exposed Structure Granulation Quality: Pink Fascia Exposed: No Necrotic Amount: Small (1-33%) Fat Layer (Subcutaneous Tissue) Exposed: Yes Necrotic Quality: Adherent Slough Tendon Exposed: No Muscle Exposed: No Joint Exposed: No Bone Exposed: No Tomeo, Vonne J. ([id number]) Treatment Notes Wound #9 (Left, Lateral, Posterior Lower Leg) Notes TCA, hyrafera blue, drawtex, abd, 3 layer wrap with unna to anchor Electronic Signature(s) Signed: 09/09/2019 4:18:06 PM By: Army Melia Entered By: Army Melia on 09/09/2019 12:58:54 Mcclarty, [name] [name] -------------------------------------------------------------------------------- Vitals Details Patient Name: LAMEISHA, DUY. Date of Service: 09/09/2019 12:45 PM Medical Record Number: [id number] Patient Account Number: [id number] Date of Birth/Sex: 01-01-1946 (73 y.o. F) Treating RN: Cornell Barman Primary Care Anaja Monts: [name] Other  Clinician: Referring [name] Kliebert: [name] Treating Megahn Killings/Extender: Tito Dine in Treatment: 39 Vital Signs Time Taken: 12:50 Temperature (F): 98.2 Height (in): 63 Pulse (bpm): 87 Weight (lbs): 224.7 Respiratory Rate (breaths/min): 16 Body Mass Index (BMI): 39.8 Blood Pressure (mmHg): 141/71 Reference Range: 80 - 120 mg / dl Electronic Signature(s) Signed: 09/10/2019 10:34:41 AM By: Lorine Bears RCP, RRT, CHT Entered By: Lorine Bears on 09/09/2019 12:52:02

## 2019-09-11 NOTE — Progress Notes (Signed)
[facility] [place] HISTORY AND PHYSICAL   History of Present Illness:  [name] is a 73 y.o. female is undergone saphenous vein ablation Dr. Kellie Simmering 2016. Now has recurrent ulcer. Wound had previously healed but opened up again approximately 1 month ago. She has had significantly increased edema recently has been placed on Lasix and metolazone. Patient cannot tolerate compression stockings but is undergoing compression therapy with Dr. Dellia Nims at the wound care center. Has a left medial ulceration which is much improved with care. Left 1 also healing but is the newest one.  She was scheduled for Venogram and possible intervention.  On 08/03/19 had a Stent placed in the left common iliac vein with 14 x 60 mm Vici.   She states she has seen improvement in the wound healing since her procedure.  She denise new wound develop.  She denise fever and chills.  Past Medical History:  Diagnosis Date  . Allergy   . Anesthesia complication    trouble waking up 2/2 CPAP  . Arthritis   . Asthma in adult   . Cataract 2019   left eye resolved with surgery  . Cervical spondylosis 2012   Jacelyn Grip, [place])  . Choroidal nevus of left eye 03/22/2016  . Chronic venous insufficiency 2016   severe reflux with painful varicosities sees [facility]  . Clotting disorder ([place])    due to vein ablation   . Complication of anesthesia    difficulty coming out due to sleep apnea per pt  . Difficult intubation    PATIENT DENIES   . Diverticulosis    per ct scans 09-2016 and 01-2017  . DVT (deep venous thrombosis) ([place]) 2011   small, developed after venous ablation  . DVT (deep venous thrombosis) ([place])   . Family history of adverse reaction to anesthesia    O2 levels drop upon waking   . Hearing loss sensory, bilateral 2013   mod-severe high freq sensorineural ([facility])  . Hiatal hernia     09-2016 ct scan [facility] at cancer center   . History of kidney stones 1980s  . History of radiation therapy  07/19/11-08/25/2011   RIGHT AXILLARY REGION/METASTATIC  . Hypertension   . Melanoma ([place]) 06/29/10   MALIGNANT MELANOMA R SHOULDER/SUPRASCAPULAR BACK s/p interferon chemo and XRT  . Neuromuscular disorder ([facility])    muscle spasms  . OSA (obstructive sleep apnea)    on CPAP  . Pneumonia    2001  . RLS (restless legs syndrome)   . Seasonal allergies   . Sleep apnea    wears cpap  . Tinnitus   . Unspecified vitamin D deficiency 03/18/2013  . Varicose veins of left lower extremity     Past Surgical History:  Procedure Laterality Date  . ABI  2016   WNL  . ANTERIOR CERVICAL DECOMP/DISCECTOMY FUSION N/A 08/22/2015   ANTERIOR CERVICAL DECOMPRESSION FUSION C3/4 - interbody graft and anterior plate; Kristeen Miss, MD  . ANTERIOR CERVICAL DECOMP/DISCECTOMY FUSION N/A 07/02/2016   Procedure: Cervical four- five Cervical five- six Cervical six- seven Anterior cervical decompression/diskectomy/fusion;  Surgeon: Kristeen Miss, MD;  Location: [facility];  Service: Neurosurgery;  Laterality: N/A;  C4-5 C5-6 C6-7 Anterior cervical decompression/diskectomy/fusion  . BREAST BIOPSY Left 2013   BENIGN  . BREAST SURGERY Right 1990   BX   . CARDIOVASCULAR STRESS TEST  2010   normal stress test, EF 66%  . CATARACT EXTRACTION W/ INTRAOCULAR LENS IMPLANT Left 2019   Dr. Kathrin Penner  .  COLONOSCOPY  03/2005   benign polyp, int hem, rpt 5 yrs ([place], [place])  . COLONOSCOPY WITH PROPOFOL N/A 01/06/2018   diverticulosis, decreased sphincter tone, no f/u needed [name], [place]ke Corin, MD)  . DEEP AXILLARY SENTINEL NODE BIOPSY / EXCISION  2012   RIGHT  . DEXA  06/2016   WNL  . DILATION AND CURETTAGE OF UTERUS  2010  . ENDOVENOUS ABLATION SAPHENOUS VEIN W/ LASER Left 2010   [facility]  . ENDOVENOUS ABLATION SAPHENOUS VEIN W/ LASER Left 2016   accessory branch [facility] Kellie Simmering)  . ESOPHAGOGASTRODUODENOSCOPY  2006   mod chronic carditis, no H pylori ([place], [employer])  . INTRAVASCULAR ULTRASOUND/IVUS N/A 08/03/2019    Waynetta Sandy, [place]   left  . LOWER EXTREMITY VENOGRAPHY Left 08/03/2019   Waynetta Sandy, MD  . LUMBAR LAMINECTOMY  2001   L4/5  . MELANOMA EXCISION  2011   Right shoulder, with sentinel lymph node biopsy  . PERIPHERAL VASCULAR INTERVENTION Left 08/03/2019   Stent of left common iliac vein with 14 x 60 mm Vici Donzetta Matters, [place] Dom, MD)  . PORT-A-CATH REMOVAL  12/05/2011   Procedure: REMOVAL PORT-A-CATH;  Surgeon: Rolm Bookbinder, MD;  Location: [facility];  Service: General;  Laterality: N/A;  left port removal  . PORTACATH PLACEMENT     left subclavian  . UPPER GASTROINTESTINAL ENDOSCOPY      ROS:   General:  No weight loss, Fever, chills  HEENT: No recent headaches, no nasal bleeding, no visual changes, no sore throat  Neurologic: No dizziness, blackouts, seizures. No recent symptoms of stroke or mini- stroke. No recent episodes of slurred speech, or temporary blindness.  Cardiac: No recent episodes of chest pain/pressure, no shortness of breath at rest.  No shortness of breath with exertion.  Denies history of atrial fibrillation or irregular heartbeat  Vascular: No history of rest pain in feet.  No history of claudication.  No history of non-healing ulcer, No history of DVT   Pulmonary: No home oxygen, no productive cough, no hemoptysis,  No asthma or wheezing  Musculoskeletal:  [ ]  Arthritis, [ ]  Low back pain,  [ ]  Joint pain  Hematologic:No history of hypercoagulable state.  No history of easy bleeding.  No history of anemia  Gastrointestinal: No hematochezia or melena,  No gastroesophageal reflux, no trouble swallowing  Urinary: [ ]  chronic Kidney disease, [ ]  on HD - [ ]  MWF or [ ]  TTHS, [ ]  Burning with urination, [ ]  Frequent urination, [ ]  Difficulty urinating;   Skin: No rashes  Psychological: No history of anxiety,  No history of depression  Social History Social History   Tobacco Use  .  Smoking status: Former Smoker    Types: Cigarettes    Quit date: 11/05/1965    Years since quitting: 53.8  . Smokeless tobacco: Never Used  . Tobacco comment: socially as a teen  Substance Use Topics  . Alcohol use: No    Alcohol/week: 0.0 standard drinks  . Drug use: No    Family History Family History  Problem Relation Age of Onset  . Cancer Mother 37       uterine  . CAD Father 74       MI  . Hypertension Father 22  . Alzheimer's disease Father 30  . Cancer Cousin        x2, breast  . Diabetes Sister   . Cancer Paternal Grandmother  melanoma, possibly  . Colon cancer Neg Hx   . Colon polyps Neg Hx   . Rectal cancer Neg Hx   . Stomach cancer Neg Hx     Allergies  Allergies  Allergen Reactions  . Sulfa Antibiotics Itching, Swelling and Shortness Of Breath    Facial swelling  . Voltaren [Diclofenac Sodium] Shortness Of Breath  . Latex Other (See Comments)    Blisters ONLY HAD REACTION TO TAPE  (??? ONLY ADHESIVE ALLERGY)  . Tape Other (See Comments)    Caused blisters - must use paper tape - same reaction from NeoPrene  . Other     Neoprene     Current Outpatient Medications  Medication Sig Dispense Refill  . acetaminophen (TYLENOL ARTHRITIS PAIN) 650 MG CR tablet Take 1,300 mg by mouth every 8 (eight) hours as needed for pain.     Marland Kitchen albuterol (PROVENTIL HFA;VENTOLIN HFA) 108 (90 Base) MCG/ACT inhaler INHALE 2 PUFFS INTO LUNGS EVERY 6 HOURS AS NEEDED FOR WHEEZING OR SHORTNESS OF BREATH 8.5 Inhaler 3  . albuterol (PROVENTIL) (2.5 MG/3ML) 0.083% nebulizer solution Take 3 mLs (2.5 mg total) by nebulization every 6 (six) hours as needed for wheezing or shortness of breath. 150 mL 1  . aspirin EC 81 MG tablet Take 81 mg by mouth at bedtime. Melanoma prevention    . azelastine (ASTELIN) 0.1 % nasal spray Place 1 spray into both nostrils 2 (two) times daily. Use in each nostril as directed 30 mL 3  . clopidogrel (PLAVIX) 75 MG tablet Take 1 tablet (75 mg total) by  mouth daily. 30 tablet 3  . famotidine (PEPCID) 10 MG tablet Take 10 mg by mouth daily.    . furosemide (LASIX) 40 MG tablet Take 1 tablet (40 mg total) by mouth daily as needed for edema (take extra lasix 40mg  in afternoons as needed). 30 tablet 0  . loratadine (CLARITIN) 10 MG tablet Take 10 mg by mouth daily.    Marland Kitchen losartan (COZAAR) 25 MG tablet TAKE 2 TABLETS BY MOUTH EVERY DAY 60 tablet 5  . meloxicam (MOBIC) 15 MG tablet Take 15 mg by mouth daily. As needed  3  . montelukast (SINGULAIR) 10 MG tablet TAKE 1 TABLET BY MOUTH EVERY DAY 30 tablet 3  . Multiple Vitamin (MULTIVITAMIN WITH MINERALS) TABS tablet Take 1 tablet by mouth daily.    . nortriptyline (PAMELOR) 25 MG capsule Take 1 capsule (25 mg total) by mouth 2 (two) times daily. 60 capsule 3  . Polyvinyl Alcohol-Povidone (TEARS PLUS OP) Place 1 drop into both eyes daily as needed (dry eyes/ redness/ burning).    . potassium chloride SA (K-DUR,KLOR-CON) 20 MEQ tablet Take 1 tablet (20 mEq total) by mouth daily as needed (take one extra potassium with lasix). 30 tablet 0  . PRESCRIPTION MEDICATION CPAP    . Probiotic Product (PROBIOTIC DAILY PO) Take 1 tablet by mouth daily.     Marland Kitchen triamcinolone (NASACORT ALLERGY 24HR) 55 MCG/ACT AERO nasal inhaler Place 2 sprays into the nose daily. In each nostril    . UNABLE TO FIND Take by mouth.    . Vitamin D, Ergocalciferol, (DRISDOL) 1.25 MG (50000 UT) CAPS capsule TAKE 1 CAPSULE (50,000 UNITS TOTAL) BY MOUTH EVERY 7 (SEVEN) DAYS. 12 capsule 3   No current facility-administered medications for this visit.     Physical Examination  Vitals:   09/11/19 0945  BP: (!) 144/81  Pulse: 78  Resp: 12  Temp: 97.8 F (36.6 C)  TempSrc:  Temporal  SpO2: 98%  Weight: 218 lb 6.4 oz (99.1 kg)  Height: 5\' 3"  (1.6 m)    Body mass index is 38.69 kg/m.  General:  Alert and oriented, no acute distress HEENT: Normal Neck: No bruit or JVD Pulmonary: Clear to auscultation bilaterally Cardiac: Regular  Rate and Rhythm without murmur Abdomen: Soft, non-tender, non-distended, no mass, no scars Skin: No rash       Neurologic: Upper and lower extremity motor 5/5 and symmetric  DATA:  IVC/Iliac Findings: +----------+------+--------+--------+    IVC    PatentThrombusComments +----------+------+--------+--------+ IVC Mid   patent                 +----------+------+--------+--------+ IVC Distalpatent                 +----------+------+--------+--------+    +-------------------+---------+-----------+---------+-----------+--------+         CIV        RT-PatentRT-ThrombusLT-PatentLT-ThrombusComments +-------------------+---------+-----------+---------+-----------+--------+ Common Iliac Prox   patent              patent                      +-------------------+---------+-----------+---------+-----------+--------+ Common Iliac Mid                        patent                      +-------------------+---------+-----------+---------+-----------+--------+ Common Iliac Distal                     patent                      +-------------------+---------+-----------+---------+-----------+--------+           Summary: IVC/Iliac: No evidence of thrombus in IVC and Iliac veins. There is no evidence of thrombus involving the right common iliac vein and left common iliac vein. There is no evidence of thrombus involving the left external iliac vein. Patent left common  iliac stent.  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  After stenting measured greater than 10 mm.  Her wounds appear to be improving and she has less edema in the left LE.   PLAN:  She was placed in an [place] boot today.  She will cont. To have wound care with triple compression wrap on the left LE.  She will f/u in 4 weeks for wound check.  She will be scheduled in the future for re-evaluation of her iliac stent.     Ruta Hinds, MD [facility]  [place] Office: [phone] Pager: [phone]

## 2019-09-14 ENCOUNTER — Other Ambulatory Visit: Payer: Self-pay

## 2019-09-14 DIAGNOSIS — I890 Lymphedema, not elsewhere classified: Secondary | ICD-10-CM | POA: Diagnosis not present

## 2019-09-14 DIAGNOSIS — L97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d: Secondary | ICD-10-CM | POA: Diagnosis not present

## 2019-09-14 DIAGNOSIS — Z86718 Personal history of other venous thrombosis and embolism: Secondary | ICD-10-CM | POA: Diagnosis not present

## 2019-09-14 DIAGNOSIS — L97222 Non-pressure chronic ulcer of left calf with fat layer exposed: Secondary | ICD-10-CM | POA: Diagnosis not present

## 2019-09-14 DIAGNOSIS — G4730 Sleep apnea, unspecified: Secondary | ICD-10-CM | POA: Diagnosis not present

## 2019-09-14 DIAGNOSIS — I10 Essential (primary) hypertension: Secondary | ICD-10-CM | POA: Diagnosis not present

## 2019-09-16 ENCOUNTER — Other Ambulatory Visit: Payer: Self-pay

## 2019-09-16 ENCOUNTER — Encounter: Payer: Medicare Other | Admitting: Internal Medicine

## 2019-09-16 DIAGNOSIS — I890 Lymphedema, not elsewhere classified: Secondary | ICD-10-CM | POA: Diagnosis not present

## 2019-09-16 DIAGNOSIS — L97222 Non-pressure chronic ulcer of left calf with fat layer exposed: Secondary | ICD-10-CM | POA: Diagnosis not present

## 2019-09-16 DIAGNOSIS — L97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d: Secondary | ICD-10-CM | POA: Diagnosis not present

## 2019-09-16 DIAGNOSIS — G4730 Sleep apnea, unspecified: Secondary | ICD-10-CM | POA: Diagnosis not present

## 2019-09-16 DIAGNOSIS — I10 Essential (primary) hypertension: Secondary | ICD-10-CM | POA: Diagnosis not present

## 2019-09-16 DIAGNOSIS — Z86718 Personal history of other venous thrombosis and embolism: Secondary | ICD-10-CM | POA: Diagnosis not present

## 2019-09-16 DIAGNOSIS — I87312 Chronic venous hypertension (idiopathic) with ulcer of left lower extremity: Secondary | ICD-10-CM | POA: Diagnosis not present

## 2019-09-16 DIAGNOSIS — L97322 Non-pressure chronic ulcer of left ankle with fat layer exposed: Secondary | ICD-10-CM | POA: Diagnosis not present

## 2019-09-17 NOTE — Progress Notes (Signed)
[name] Visit Report for 09/16/2019 Arrival Information Details Patient Name: [name], [name]. Date of Service: 09/16/2019 2:00 PM Medical Record Number: [id number] Patient Account Number: [id number] Date of Birth/Sex: [birth year] (73 y.o. F) Treating RN: Cornell Barman Primary Care Wilfred Siverson: [name] Other Clinician: Referring Shameka Aggarwal: [name] Treating Wyoma Genson/Extender: Tito Dine in Treatment: 18 Visit Information History Since Last Visit Added or deleted any medications: No Patient Arrived: Ambulatory Any new allergies or adverse reactions: No Arrival Time: 14:13 Had a fall or experienced change in No Accompanied By: self activities of daily living that may affect Transfer Assistance: None risk of falls: Patient Identification Verified: Yes Signs or symptoms of abuse/neglect since last visito No Secondary Verification Process Yes Hospitalized since last visit: No Completed: Implantable device outside of the clinic excluding No Patient Requires Transmission-Based No cellular tissue based products placed in the center Precautions: since last visit: Patient Has Alerts: Yes Has Dressing in Place as Prescribed: Yes Patient Alerts: Patient on Blood Pain Present Now: Yes Thinner aspirin 81 Electronic Signature(s) Signed: 09/16/2019 2:55:56 PM By: Lorine Bears RCP, RRT, CHT Entered By: Lorine Bears on 09/16/2019 14:14:10 Borg, [name] ([id number]) -------------------------------------------------------------------------------- Encounter Discharge Information Details Patient Name: [name]. Date of Service: 09/16/2019 2:00 PM Medical Record Number: [id number] Patient Account Number: [id number] Date of Birth/Sex: July 02, 1946 (73 y.o. F) Treating RN: Harold Barban Primary Care Xzavier Swinger: [name] Other Clinician: Referring Yonatan Guitron: [name] Treating Jondavid Schreier/Extender:  Tito Dine in Treatment: 40 Encounter Discharge Information Items Post Procedure Vitals Discharge Condition: Stable Temperature (F): 99.0 Ambulatory Status: Ambulatory Pulse (bpm): 89 Discharge Destination: Home Respiratory Rate (breaths/min): 16 Transportation: Private Auto Blood Pressure (mmHg): 138/71 Accompanied By: self Schedule Follow-up Appointment: Yes Clinical Summary of Care: Electronic Signature(s) Signed: 09/16/2019 4:36:31 PM By: Harold Barban Entered By: Harold Barban on 09/16/2019 15:49:05 Alkema, [name] ([id number]) -------------------------------------------------------------------------------- Lower Extremity Assessment Details Patient Name: [name], [name]. Date of Service: 09/16/2019 2:00 PM Medical Record Number: [id number] Patient Account Number: [id number] Date of Birth/Sex: [birth year] (73 y.o. F) Treating RN: Army Melia Primary Care Jayni Prescher: [name] Other Clinician: Referring Evelette Hollern: [name] Treating Deagen Krass/Extender: Tito Dine in Treatment: 40 Edema Assessment Assessed: [Left: No] [Right: No] Edema: [Left: N] [Right: o] Calf Left: Right: Point of Measurement: 31 cm From Medial Instep 44 cm cm Ankle Left: Right: Point of Measurement: 12 cm From Medial Instep 24 cm cm Vascular Assessment Pulses: Dorsalis Pedis Palpable: [Left:Yes] Electronic Signature(s) Signed: 09/17/2019 3:26:34 PM By: Army Melia Entered By: Army Melia on 09/16/2019 14:29:42 Hyslop, [name] ([id number]) -------------------------------------------------------------------------------- Multi Wound Chart Details Patient Name: [name]. Date of Service: 09/16/2019 2:00 PM Medical Record Number: [id number] Patient Account Number: [id number] Date of Birth/Sex: 1946-09-13 (73 y.o. F) Treating RN: Cornell Barman Primary Care Taeya Theall: [name] Other Clinician: Referring Jolly Bleicher: [name] Treating  Jurnee Nakayama/Extender: Tito Dine in Treatment: 40 Vital Signs Height(in): 63 Pulse(bpm): 77 Weight(lbs): 224.7 Blood Pressure(mmHg): 138/71 Body Mass Index(BMI): 40 Temperature(F): 99.0 Respiratory Rate 16 (breaths/min): Photos: Wound Location: Left Lower Leg - Medial Left Lower Leg - Lateral Left Lower Leg - Lateral, Posterior Wounding Event: Gradually Appeared Gradually Appeared Gradually Appeared Primary Etiology: Lymphedema Venous Leg Ulcer Venous Leg Ulcer Comorbid History: Cataracts, Asthma, Sleep Cataracts, Asthma, Sleep Cataracts, Asthma, Sleep Apnea, Deep Vein Apnea, Deep Vein Apnea, Deep Vein Thrombosis, Hypertension, Thrombosis, Hypertension, Thrombosis, Hypertension, Peripheral Venous Disease, Peripheral Venous Disease, Peripheral Venous Disease, Osteoarthritis, Received Osteoarthritis, Received Osteoarthritis,  Received Chemotherapy, Received Chemotherapy, Received Chemotherapy, Received Radiation Radiation Radiation Date Acquired: 11/19/2018 01/19/2019 07/08/2019 Weeks of Treatment: 40 34 10 Wound Status: Open Open Open Measurements L x W x D 2.6x2.4x0.2 4.5x3x0.2 1.5x2.5x0.1 (cm) Area (cm) : 4.901 10.603 2.945 Volume (cm) : 0.98 2.121 0.295 % Reduction in Area: 42.60% -4719.50% -421.20% % Reduction in Volume: -14.60% -9540.90% -417.50% Classification: Full Thickness Without Full Thickness Without Full Thickness Without Exposed Support Structures Exposed Support Structures Exposed Support Structures Exudate Amount: Large Large Large Exudate Type: Serosanguineous Serosanguineous Serosanguineous Exudate Color: red, brown red, brown red, brown Wound Margin: Flat and Intact Flat and Intact Flat and Intact Granulation Amount: Medium (34-66%) Small (1-33%) Large (67-100%) Granulation Quality: Pale Pink Pink Necrotic Amount: Medium (34-66%) Large (67-100%) Small (1-33%) Brierley, Loza J. ([id number]) Exposed Structures: Fat Layer (Subcutaneous Fat Layer  (Subcutaneous Fat Layer (Subcutaneous Tissue) Exposed: Yes Tissue) Exposed: Yes Tissue) Exposed: Yes Fascia: No Fascia: No Fascia: No Tendon: No Tendon: No Tendon: No Muscle: No Muscle: No Muscle: No Joint: No Joint: No Joint: No Bone: No Bone: No Bone: No Epithelialization: None None None Treatment Notes Electronic Signature(s) Signed: 09/16/2019 5:21:25 PM By: [name], [name], RN, CWS, [name], [name] Entered By: [name], [name], RN, CWS, Kim on 09/16/2019 15:13:04 [name] -------------------------------------------------------------------------------- Multi-Disciplinary Care Plan Details Patient Name: REAGAN, CHAPPA. Date of Service: 09/16/2019 2:00 PM Medical Record Number: [id number] Patient Account Number: [id number] Date of Birth/Sex: 1946-06-23 (73 y.o. F) Treating RN: Cornell Barman Primary Care Kriston Mckinnie: [name] Other Clinician: Referring Melesio Madara: [name] Treating Hilery Wintle/Extender: Tito Dine in Treatment: 44 Active Inactive Orientation to the Wound Care Program Nursing Diagnoses: Knowledge deficit related to the wound healing center program Goals: Patient/caregiver will verbalize understanding of the [facility] Program Date Initiated: 12/10/2018 Target Resolution Date: 01/09/2019 Goal Status: Active Interventions: Provide education on orientation to the wound center Notes: Soft Tissue Infection Nursing Diagnoses: Impaired tissue integrity Goals: Patient's soft tissue infection will resolve Date Initiated: 12/10/2018 Target Resolution Date: 01/09/2019 Goal Status: Active Interventions: Assess signs and symptoms of infection every visit Notes: Venous Leg Ulcer Nursing Diagnoses: Actual venous Insuffiency (use after diagnosis is confirmed) Goals: Patient will maintain optimal edema control Date Initiated: 12/10/2018 Target Resolution Date: 01/09/2019 Goal Status: Active Interventions: Assess peripheral edema  status every visit. ALYSSHA, GUYETT ([id number]) Treatment Activities: Therapeutic compression applied : 12/10/2018 Notes: Wound/Skin Impairment Nursing Diagnoses: Impaired tissue integrity Goals: Patient/caregiver will verbalize understanding of skin care regimen Date Initiated: 12/10/2018 Target Resolution Date: 01/09/2019 Goal Status: Active Interventions: Assess ulceration(s) every visit Treatment Activities: Topical wound management initiated : 12/10/2018 Notes: Electronic Signature(s) Signed: 09/16/2019 5:21:25 PM By: [name], [name], RN, CWS, [name], [name] Entered By: [name], [name], RN, CWS, Kim on 09/16/2019 15:12:53 Bloodsworth, [name] -------------------------------------------------------------------------------- Pain Assessment Details Patient Name: VICKEE, BIGNELL. Date of Service: 09/16/2019 2:00 PM Medical Record Number: [id number] Patient Account Number: [id number] Date of Birth/Sex: 1946/06/29 (73 y.o. F) Treating RN: Cornell Barman Primary Care Taniaya Rudder: [name] Other Clinician: Referring Aavya [name]: [name] Treating Valina Maes/Extender: Tito Dine in Treatment: 40 Active Problems Location of Pain Severity and Description of Pain Patient Has Paino Yes Site Locations Rate the pain. Current Pain Level: 2 Pain Management and Medication Current Pain Management: Electronic Signature(s) Signed: 09/16/2019 2:55:56 PM By: Lorine Bears RCP, RRT, CHT Signed: 09/16/2019 5:21:25 PM By: [name], [name], RN, CWS, [name], [name] Entered By: Lorine Bears on 09/16/2019 14:14:22 [name] --------------------------------------------------------------------------------  Wound Assessment Details Patient Name: ASHLLEY, HILGART. Date of Service: 09/16/2019 2:00 PM Medical Record Number: [id number] Patient Account Number: [id number] Date of Birth/Sex: Oct 02, 1946 (73 y.o. F) Treating RN: Army Melia Primary Care  Ahmyah Gidley: [name] Other Clinician: Referring Scotti Motter: [name] Treating Sheniah Supak/Extender: Tito Dine in Treatment: 40 Wound Status Wound Number: 5 Primary Lymphedema Etiology: Wound Location: Left Lower Leg - Medial Wound Open Wounding Event: Gradually Appeared Status: Date Acquired: 11/19/2018 Comorbid Cataracts, Asthma, Sleep Apnea, Deep Vein Weeks Of Treatment: 40 History: Thrombosis, Hypertension, Peripheral Venous Clustered Wound: No Disease, Osteoarthritis, Received Chemotherapy, Received Radiation Photos Wound Measurements Length: (cm) 2.6 Width: (cm) 2.4 Depth: (cm) 0.2 Area: (cm) 4.901 Volume: (cm) 0.98 % Reduction in Area: 42.6% % Reduction in Volume: -14.6% Epithelialization: None Tunneling: No Undermining: No Wound Description Full Thickness Without Exposed Support Classification: Structures Wound Margin: Flat and Intact Exudate Large Amount: Exudate Type: Serosanguineous Exudate Color: red, brown Foul Odor After Cleansing: No Slough/Fibrino Yes Wound Bed Granulation Amount: Medium (34-66%) Exposed Structure Granulation Quality: Pale Fascia Exposed: No Necrotic Amount: Medium (34-66%) Fat Layer (Subcutaneous Tissue) Exposed: Yes Necrotic Quality: Adherent Slough Tendon Exposed: No Muscle Exposed: No Joint Exposed: No Bone Exposed: No Ayotte, Dajane J. ([id number]) Treatment Notes Wound #5 (Left, Medial Lower Leg) Notes TCA, Mepitel, Hblue, Drawtes, ABD, 3-Layer, unna to anchor Electronic Signature(s) Signed: 09/17/2019 3:26:34 PM By: Army Melia Entered By: Army Melia on 09/16/2019 14:26:33 Vangilder, [name] ([id number]) -------------------------------------------------------------------------------- Wound Assessment Details Patient Name: NGAIRE, GAMBLE. Date of Service: 09/16/2019 2:00 PM Medical Record Number: [id number] Patient Account Number: [id number] Date of Birth/Sex: 09/17/1946 (73 y.o.  F) Treating RN: Army Melia Primary Care Hussain Maimone: [name] Other Clinician: Referring Keala Drum: [name] Treating Parminder Trapani/Extender: Tito Dine in Treatment: 40 Wound Status Wound Number: 6 Primary Venous Leg Ulcer Etiology: Wound Location: Left Lower Leg - Lateral Wound Open Wounding Event: Gradually Appeared Status: Date Acquired: 01/19/2019 Comorbid Cataracts, Asthma, Sleep Apnea, Deep Vein Weeks Of Treatment: 34 History: Thrombosis, Hypertension, Peripheral Venous Clustered Wound: No Disease, Osteoarthritis, Received Chemotherapy, Received Radiation Photos Wound Measurements Length: (cm) 4.5 Width: (cm) 3 Depth: (cm) 0.2 Area: (cm) 10.603 Volume: (cm) 2.121 % Reduction in Area: -4719.5% % Reduction in Volume: -9540.9% Epithelialization: None Tunneling: No Undermining: No Wound Description Full Thickness Without Exposed Support Classification: Structures Wound Margin: Flat and Intact Exudate Large Amount: Exudate Type: Serosanguineous Exudate Color: red, brown Foul Odor After Cleansing: No Slough/Fibrino Yes Wound Bed Granulation Amount: Small (1-33%) Exposed Structure Granulation Quality: Pink Fascia Exposed: No Necrotic Amount: Large (67-100%) Fat Layer (Subcutaneous Tissue) Exposed: Yes Necrotic Quality: Adherent Slough Tendon Exposed: No Muscle Exposed: No Joint Exposed: No Bone Exposed: No Nicole, Naylea J. ([id number]) Treatment Notes Wound #6 (Left, Lateral Lower Leg) Notes TCA, Mepitel, Hblue, Drawtes, ABD, 3-Layer, unna to anchor Electronic Signature(s) Signed: 09/17/2019 3:26:34 PM By: Army Melia Entered By: Army Melia on 09/16/2019 14:28:55 Sutch, [name] ([id number]) -------------------------------------------------------------------------------- Wound Assessment Details Patient Name: OLENA, ROLLER. Date of Service: 09/16/2019 2:00 PM Medical Record Number: [id number] Patient Account Number:  [id number] Date of Birth/Sex: 03/27/1946 (74 y.o. F) Treating RN: Army Melia Primary Care Jeison Delpilar: [name] Other Clinician: Referring Naydeline Morace: [name] Treating Renae Mottley/Extender: Tito Dine in Treatment: 40 Wound Status Wound Number: 9 Primary Venous Leg Ulcer Etiology: Wound Location: Left Lower Leg - Lateral, Posterior Wound Open Wounding Event: Gradually Appeared Status: Date Acquired: 07/08/2019 Comorbid Cataracts, Asthma, Sleep Apnea, Deep Vein Weeks Of Treatment: 10 History:  Thrombosis, Hypertension, Peripheral Venous Clustered Wound: No Disease, Osteoarthritis, Received Chemotherapy, Received Radiation Photos Wound Measurements Length: (cm) 1.5 Width: (cm) 2.5 Depth: (cm) 0.1 Area: (cm) 2.945 Volume: (cm) 0.295 % Reduction in Area: -421.2% % Reduction in Volume: -417.5% Epithelialization: None Tunneling: No Undermining: No Wound Description Full Thickness Without Exposed Support Classification: Structures Wound Margin: Flat and Intact Exudate Large Amount: Exudate Type: Serosanguineous Exudate Color: red, brown Foul Odor After Cleansing: No Slough/Fibrino Yes Wound Bed Granulation Amount: Large (67-100%) Exposed Structure Granulation Quality: Pink Fascia Exposed: No Necrotic Amount: Small (1-33%) Fat Layer (Subcutaneous Tissue) Exposed: Yes Necrotic Quality: Adherent Slough Tendon Exposed: No Muscle Exposed: No Joint Exposed: No Bone Exposed: No Forand, Shailyn J. ([id number]) Treatment Notes Wound #9 (Left, Lateral, Posterior Lower Leg) Notes TCA, Mepitel, Hblue, Drawtes, ABD, 3-Layer, unna to anchor Electronic Signature(s) Signed: 09/17/2019 3:26:34 PM By: Army Melia Entered By: Army Melia on 09/16/2019 14:29:22 Guzzi, [name] -------------------------------------------------------------------------------- Vitals Details Patient Name: RAGINE, BALDER. Date of Service: 09/16/2019 2:00  PM Medical Record Number: [id number] Patient Account Number: [id number] Date of Birth/Sex: [birth year] (73 y.o. F) Treating RN: Cornell Barman Primary Care Gabino Hagin: [name] Other Clinician: Referring Achsah Mcquade: [name] Treating Andrzej Scully/Extender: Tito Dine in Treatment: 40 Vital Signs Time Taken: 14:12 Temperature (F): 99.0 Height (in): 63 Pulse (bpm): 89 Weight (lbs): 224.7 Respiratory Rate (breaths/min): 16 Body Mass Index (BMI): 39.8 Blood Pressure (mmHg): 138/71 Reference Range: 80 - 120 mg / dl Electronic Signature(s) Signed: 09/16/2019 2:55:56 PM By: Lorine Bears RCP, RRT, CHT Entered By: Lorine Bears on 09/16/2019 14:14:48

## 2019-09-17 NOTE — Progress Notes (Signed)
OCCIE, DELUCIA ([id number]) Visit Report for 09/14/2019 Arrival Information Details Patient Name: [name], [name]. Date of Service: 09/14/2019 11:00 AM Medical Record Number: [id number] Patient Account Number: [id number] Date of Birth/Sex: 1946-07-01 (73 y.o. F) Treating RN: Army Melia Primary Care Sylar Voong: [name] Other Clinician: Referring Jannette Cotham: [name] Treating Pelham Hennick/Extender: Melburn Hake, HOYT Weeks in Treatment: 19 Visit Information History Since Last Visit Added or deleted any medications: No Patient Arrived: Ambulatory Any new allergies or adverse reactions: No Arrival Time: 11:09 Had a fall or experienced change in No Accompanied By: self activities of daily living that may affect Transfer Assistance: None risk of falls: Patient Identification Verified: Yes Signs or symptoms of abuse/neglect since last visito No Patient Requires Transmission-Based No Hospitalized since last visit: No Precautions: Has Dressing in Place as Prescribed: Yes Patient Has Alerts: Yes Pain Present Now: No Patient Alerts: Patient on Blood Thinner aspirin 81 Electronic Signature(s) Signed: 09/17/2019 3:26:34 PM By: Army Melia Entered By: Army Melia on 09/14/2019 11:12:41 Barrantes, [name] -------------------------------------------------------------------------------- Compression Therapy Details Patient Name: [name]. Date of Service: 09/14/2019 11:00 AM Medical Record Number: [id number] Patient Account Number: [id number] Date of Birth/Sex: 09-12-1946 (73 y.o. F) Treating RN: Army Melia Primary Care Alina Gilkey: [name] Other Clinician: Referring Mayra Brahm: [name] Treating Zyier Dykema/Extender: STONE III, HOYT Weeks in Treatment: 39 Compression Therapy Performed for Wound Assessment: Wound #5 Left,Medial Lower Leg Performed By: Clinician Army Melia, RN Compression Type: Three Layer Pre Treatment ABI: 1 Electronic  Signature(s) Signed: 09/17/2019 3:26:34 PM By: Army Melia Entered By: Army Melia on 09/14/2019 11:27:36 Gardin, [name] -------------------------------------------------------------------------------- Compression Therapy Details Patient Name: [name]. Date of Service: 09/14/2019 11:00 AM Medical Record Number: [id number] Patient Account Number: [id number] Date of Birth/Sex: February 24, 1946 (73 y.o. F) Treating RN: Army Melia Primary Care Icy Fuhrmann: [name] Other Clinician: Referring Sharvi Mooneyhan: [name] Treating Rabia Argote/Extender: STONE III, HOYT Weeks in Treatment: 39 Compression Therapy Performed for Wound Assessment: Wound #6 Left,Lateral Lower Leg Performed By: Clinician Army Melia, RN Compression Type: Three Layer Pre Treatment ABI: 1 Electronic Signature(s) Signed: 09/17/2019 3:26:34 PM By: Army Melia Entered By: Army Melia on 09/14/2019 11:27:37 Berghuis, [name] -------------------------------------------------------------------------------- Compression Therapy Details Patient Name: [name]. Date of Service: 09/14/2019 11:00 AM Medical Record Number: [id number] Patient Account Number: [id number] Date of Birth/Sex: [birth year] (73 y.o. F) Treating RN: Army Melia Primary Care Feras Gardella: [name] Other Clinician: Referring Isaiah Cianci: [name] Treating Aodhan Scheidt/Extender: STONE III, HOYT Weeks in Treatment: 39 Compression Therapy Performed for Wound Assessment: Wound #9 Left,Lateral,Posterior Lower Leg Performed By: Clinician Army Melia, RN Compression Type: Three Layer Pre Treatment ABI: 1 Electronic Signature(s) Signed: 09/17/2019 3:26:34 PM By: Army Melia Entered By: Army Melia on 09/14/2019 11:27:37 SHARALYN, MIKELS ([id number]) -------------------------------------------------------------------------------- Encounter Discharge Information Details Patient Name: [name]. Date of  Service: 09/14/2019 11:00 AM Medical Record Number: [id number] Patient Account Number: [id number] Date of Birth/Sex: Mar 08, 1946 (73 y.o. F) Treating RN: Army Melia Primary Care Yahmir Sokolov: [name] Other Clinician: Referring Bren Borys: [name] Treating Barbee Mamula/Extender: Melburn Hake, HOYT Weeks in Treatment: 53 Encounter Discharge Information Items Discharge Condition: Stable Ambulatory Status: Ambulatory Discharge Destination: Home Transportation: Private Auto Accompanied By: self Schedule Follow-up Appointment: Yes Clinical Summary of Care: Electronic Signature(s) Signed: 09/17/2019 3:26:34 PM By: Army Melia Entered By: Army Melia on 09/14/2019 11:28:48 Iglehart, [name] -------------------------------------------------------------------------------- Wound Assessment Details Patient Name: [name]. Date of Service: 09/14/2019 11:00 AM Medical Record Number: [id number] Patient Account Number: [id number]  Date of Birth/Sex: 1946/09/23 (73 y.o. F) Treating RN: Army Melia Primary Care Jalik Gellatly: [name] Other Clinician: Referring Auguste Tebbetts: [name] Treating Airyanna Dipalma/Extender: STONE III, HOYT Weeks in Treatment: 39 Wound Status Wound Number: 5 Primary Lymphedema Etiology: Wound Location: Left Lower Leg - Medial Wound Open Wounding Event: Gradually Appeared Status: Date Acquired: 11/19/2018 Comorbid Cataracts, Asthma, Sleep Apnea, Deep Vein Weeks Of Treatment: 39 History: Thrombosis, Hypertension, Peripheral Venous Clustered Wound: No Disease, Osteoarthritis, Received Chemotherapy, Received Radiation Photos Wound Measurements Length: (cm) 2.8 Width: (cm) 2.4 Depth: (cm) 0.2 Area: (cm) 5.278 Volume: (cm) 1.056 % Reduction in Area: 38.2% % Reduction in Volume: -23.5% Epithelialization: None Tunneling: No Undermining: No Wound Description Full Thickness Without Exposed Support Foul Od Classification: Structures  Slough/ Wound Margin: Flat and Intact Exudate Large Amount: Exudate Type: Serosanguineous Exudate Color: red, brown or After Cleansing: No Fibrino Yes Wound Bed Granulation Amount: Medium (34-66%) Exposed Structure Granulation Quality: Pale Fascia Exposed: No Necrotic Amount: Medium (34-66%) Fat Layer (Subcutaneous Tissue) Exposed: Yes Necrotic Quality: Adherent Slough Tendon Exposed: No Muscle Exposed: No Joint Exposed: No Bone Exposed: No KHYIA, AFIFI ([id number]) Electronic Signature(s) Signed: 09/17/2019 3:26:34 PM By: Army Melia Entered By: Army Melia on 09/14/2019 11:26:04 [name] -------------------------------------------------------------------------------- Wound Assessment Details Patient Name: ARANTZA, KELLERMANN. Date of Service: 09/14/2019 11:00 AM Medical Record Number: [id number] Patient Account Number: [id number] Date of Birth/Sex: 09-17-1946 (73 y.o. F) Treating RN: Army Melia Primary Care Taiylor Virden: [name] Other Clinician: Referring Tyriq Moragne: [name] Treating Colsen Modi/Extender: STONE III, HOYT Weeks in Treatment: 39 Wound Status Wound Number: 6 Primary Venous Leg Ulcer Etiology: Wound Location: Left Lower Leg - Lateral Wound Open Wounding Event: Gradually Appeared Status: Date Acquired: 01/19/2019 Comorbid Cataracts, Asthma, Sleep Apnea, Deep Vein Weeks Of Treatment: 34 History: Thrombosis, Hypertension, Peripheral Venous Clustered Wound: No Disease, Osteoarthritis, Received Chemotherapy, Received Radiation Photos Wound Measurements Length: (cm) 4.5 Width: (cm) 3 Depth: (cm) 0.2 Area: (cm) 10.603 Volume: (cm) 2.121 % Reduction in Area: -4719.5% % Reduction in Volume: -9540.9% Epithelialization: None Tunneling: No Undermining: No Wound Description Full Thickness Without Exposed Support Foul Od Classification: Structures Slough/ Wound Margin: Flat and Intact Exudate Large Amount: Exudate  Type: Serosanguineous Exudate Color: red, brown or After Cleansing: No Fibrino Yes Wound Bed Granulation Amount: Small (1-33%) Exposed Structure Granulation Quality: Pink Fascia Exposed: No Necrotic Amount: Large (67-100%) Fat Layer (Subcutaneous Tissue) Exposed: Yes Necrotic Quality: Adherent Slough Tendon Exposed: No Muscle Exposed: No Joint Exposed: No Bone Exposed: No AZMARIAH, SUPINGER ([id number]) Electronic Signature(s) Signed: 09/17/2019 3:26:34 PM By: Army Melia Entered By: Army Melia on 09/14/2019 11:26:29 Kalka, [name] -------------------------------------------------------------------------------- Wound Assessment Details Patient Name: AYAAT, MOCCIA. Date of Service: 09/14/2019 11:00 AM Medical Record Number: [id number] Patient Account Number: [id number] Date of Birth/Sex: [birth year] (73 y.o. F) Treating RN: Army Melia Primary Care Akya Fiorello: [name] Other Clinician: Referring Arrin Pintor: [name] Treating Elainah Rhyne/Extender: STONE III, HOYT Weeks in Treatment: 39 Wound Status Wound Number: 9 Primary Venous Leg Ulcer Etiology: Wound Location: Left Lower Leg - Lateral, Posterior Wound Open Wounding Event: Gradually Appeared Status: Date Acquired: 07/08/2019 Comorbid Cataracts, Asthma, Sleep Apnea, Deep Vein Weeks Of Treatment: 9 History: Thrombosis, Hypertension, Peripheral Venous Clustered Wound: No Disease, Osteoarthritis, Received Chemotherapy, Received Radiation Photos Wound Measurements Length: (cm) 1.3 Width: (cm) 0.8 Depth: (cm) 0.1 Area: (cm) 0.817 Volume: (cm) 0.082 % Reduction in Area: -44.6% % Reduction in Volume: -43.9% Epithelialization: None Tunneling: No Undermining: No Wound Description Full Thickness Without Exposed Support Classification:  Structures Wound Margin: Flat and Intact Exudate Large Amount: Exudate Type: Serosanguineous Exudate Color: red, brown Foul Odor After Cleansing:  No Slough/Fibrino Yes Wound Bed Granulation Amount: Large (67-100%) Exposed Structure Granulation Quality: Pink Fascia Exposed: No Necrotic Amount: Small (1-33%) Fat Layer (Subcutaneous Tissue) Exposed: Yes Necrotic Quality: Adherent Slough Tendon Exposed: No Muscle Exposed: No Joint Exposed: No Bone Exposed: No CHEA, WOOLWORTH ([id number]) Electronic Signature(s) Signed: 09/17/2019 3:26:34 PM By: Army Melia Entered By: Army Melia on 09/14/2019 11:27:08

## 2019-09-18 NOTE — Progress Notes (Signed)
KALANI, SCHWITTERS ([id number]) Visit Report for 09/16/2019 Debridement Details Patient Name: [name], [name]. Date of Service: 09/16/2019 2:00 PM Medical Record Number: [id number] Patient Account Number: [id number] Date of Birth/Sex: 06/07/1946 (73 y.o. F) Treating RN: Cornell Barman Primary Care Provider: [name] Other Clinician: Referring Provider: [name] Treating Provider/Extender: Tito Dine in Treatment: 40 Debridement Performed for Wound #5 Left,Medial Lower Leg Assessment: Performed By: Physician [name] Dillon, MD Debridement Type: Debridement Level of Consciousness (Pre- Awake and Alert procedure): Pre-procedure Verification/Time Yes - 15:14 Out Taken: Start Time: 15:14 Pain Control: Lidocaine Total Area Debrided (L x W): 2.6 (cm) x 2.4 (cm) = 6.24 (cm) Tissue and other material Viable, Non-Viable, Slough, Subcutaneous, Slough debrided: Level: Skin/Subcutaneous Tissue Debridement Description: Excisional Instrument: Curette Bleeding: Minimum Hemostasis Achieved: Pressure End Time: 15:15 Response to Treatment: Procedure was tolerated well Level of Consciousness Awake and Alert (Post-procedure): Post Debridement Measurements of Total Wound Length: (cm) 2.6 Width: (cm) 2.4 Depth: (cm) 0.3 Volume: (cm) 1.47 Character of Wound/Ulcer Post Debridement: Stable Post Procedure Diagnosis Same as Pre-procedure Electronic Signature(s) Signed: 09/16/2019 5:41:49 PM By: Linton Ham MD Signed: 09/18/2019 3:15:39 PM By: [name], [name], RN, CWS, [name], [name] Previous Signature: 09/16/2019 5:21:25 PM Version By: [name], [name], RN, CWS, [name], [name] Entered By: Linton Ham on 09/16/2019 17:34:47 [name], [name] -------------------------------------------------------------------------------- Debridement Details Patient Name: [name], [name]. Date of Service: 09/16/2019 2:00 PM Medical Record Number: [id number] Patient Account  Number: [id number] Date of Birth/Sex: June 27, 1946 (73 y.o. F) Treating RN: Cornell Barman Primary Care Provider: [name] Other Clinician: Referring Provider: [name] Treating Provider/Extender: Tito Dine in Treatment: 40 Debridement Performed for Wound #6 Left,Lateral Lower Leg Assessment: Performed By: Physician [name] Dillon, MD Debridement Type: Debridement Severity of Tissue Pre Fat layer exposed Debridement: Level of Consciousness (Pre- Awake and Alert procedure): Pre-procedure Verification/Time Yes - 15:14 Out Taken: Start Time: 15:14 Pain Control: Lidocaine Total Area Debrided (L x W): 4.5 (cm) x 3 (cm) = 13.5 (cm) Tissue and other material Viable, Non-Viable, Slough, Subcutaneous, Slough debrided: Level: Skin/Subcutaneous Tissue Debridement Description: Excisional Instrument: Curette Bleeding: Minimum Hemostasis Achieved: Pressure End Time: 15:15 Response to Treatment: Procedure was tolerated well Level of Consciousness Awake and Alert (Post-procedure): Post Debridement Measurements of Total Wound Length: (cm) 4.5 Width: (cm) 3 Depth: (cm) 0.3 Volume: (cm) 3.181 Character of Wound/Ulcer Post Debridement: Stable Severity of Tissue Post Debridement: Fat layer exposed Post Procedure Diagnosis Same as Pre-procedure Electronic Signature(s) Signed: 09/16/2019 5:41:49 PM By: Linton Ham MD Signed: 09/18/2019 3:15:39 PM By: [name], [name], RN, CWS, [name], [name] Previous Signature: 09/16/2019 5:21:25 PM Version By: [name], [name], RN, CWS, [name], [name] Entered By: Linton Ham on 09/16/2019 17:34:59 [name], [name] -------------------------------------------------------------------------------- HPI Details Patient Name: [name], [name]. Date of Service: 09/16/2019 2:00 PM Medical Record Number: [id number] Patient Account Number: [id number] Date of Birth/Sex: Apr 30, 1946 (73 y.o. F) Treating RN: Cornell Barman Primary Care  Provider: [name] Other Clinician: Referring Provider: [name] Treating Provider/Extender: Tito Dine in Treatment: 45 History of Present Illness HPI Description: Pleasant 73 year old with history of chronic venous insufficiency. No diabetes or peripheral vascular disease. Left ABI 1.29. Questionable history of left lower extremity DVT. She developed a recurrent ulceration on her left lateral calf in December 2015, which she attributes to poor diet and subsequent lower extremity edema. She underwent endovenous laser ablation of her left greater saphenous vein in 2010. She underwent laser ablation of accessory branch  of left GSV in April 2016 by Dr. Kellie Simmering at [name]. She was previously wearing Unna boots, which she tolerated well. Tolerating 2 layer compression and cadexomer iodine. She returns to clinic for follow-up and is without new complaints. She denies any significant pain at this time. She reports persistent pain with pressure. No claudication or ischemic rest pain. No fever or chills. No drainage. READMISSION 11/13/16; this is a 73 year old woman who is not a diabetic. She is here for a review of a painful area on her left medial lower extremity. I note that she was seen here previously last year for wound I believe to be in the same area. At that time she had undergone previously a left greater saphenous vein ablation by Dr. Kellie Simmering and she had a ablation of the anterior accessory branch of the left greater saphenous vein in March 2016. Seeing that the wound actually closed over. In reviewing the history with her today the ulcer in this area has been recurrent. She describes a biopsy of this area in 2009 that only showed stasis physiology. 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 Do not see that she has ever had arterial studies in the left leg. She tells me she has a follow-up with Dr. Kellie Simmering in roughly 10 days In any case she developed the reopening of this area roughly a month ago. On the background of this she describes rapidly increasing edema which has responded to Lasix 40 mg and metolazone 2.5 mg as well as the patient's lymph massage. She has been told she has both venous insufficiency and lymphedema but she cannot tolerate compression stockings 11/28/16; the patient saw Dr. Kellie Simmering recently. Per the patient he did arterial Dopplers in the office that did not show evidence of arterial insufficiency, per the patient he stated "treat this like an ordinary venous ulcer". She also saw her dermatologist Dr. Ronnald Ramp who felt that this was more of a vascular ulcer. In general things are improving although she arrives today with increasing bilateral lower extremity edema with weeping a deeper fluid through the wound on the left medial leg compatible with some degree of lymphedema 12/04/16; the patient's wound is fully epithelialized but I don't think fully healed. We will do another week of depression with Promogran and TCA however I suspect we'll be able to discharge her next week. This is a very unusual-looking wound which was initially a figure-of-eight type wound lying on its side surrounded by petechial like hemorrhage. She has had venous ablation on this side. She apparently does not have an arterial issue per Dr. Kellie Simmering. She saw her dermatologist thought it was "vascular". Patient is definitely going to need ongoing compression and I talked about this with her today she will go to elastic therapy after she leaves here next week 12/11/16; the patient's wound is not completely closed today. She has surrounding scar tissue and in further discussion with the patient it would appear that she had ulcers in this area in 2009 for a  prolonged period of time ultimately requiring a punch biopsy of this area that only showed venous insufficiency. I did not previously pickup on this part of the history from the patient. 12/18/16; the patient's wound is completely epithelialized. There is no open area here.  She has significant bilateral venous insufficiency with secondary lymphedema to a mild-to-moderate degree she does not have compression stockings.. She did not say anything to me when I was in the room, she told our intake nurse that she was still having pain in this area. This isn't unusual recurrent small open area. She is going to go to elastic therapy to obtain compression stockings. 12/25/16; the patient's wound is fully epithelialized. There is no open area here. The patient describes some continued episodic discomfort in this area medial left calf. However everything looks fine and healed here. She is been to elastic therapy and caught herself 15-20 mmHg stockings, they apparently were having trouble getting 20-30 mm stockings in her size [name], [name] 01/22/17; this is a patient we discharged from the clinic a month ago. She has a recurrent open wound on her medial left calf. She had 15 mm support stockings. I told her I thought she needed 20-30 mm compression stockings. She tells me that she has been ill with hospitalization secondary to asthma and is been found to have severe hypokalemia likely secondary to a combination of Lasix and metolazone. This morning she noted blistering and leaking fluid on the posterior part of her left leg. She called our intake nurse urgently and we was saw her this afternoon. She has not had any real discomfort here. I don't know that she's been wearing any stockings on this leg for at least 2-3 days. ABIs in this clinic were 1.21 on the right and 1.3 on the left. She is previously seen vascular surgery who does not think that there is a peripheral arterial issue. 01/30/17;  Patient arrives with no open wound on the left leg. She has been to elastic therapy and obtained 20-33mmhg below knee stockings and she has one on the right leg today. READMISSION 02/19/18; this Chagoya is a now 73 year old patient we've had in this clinic perhaps 3 times before. I had last looked at her from January 07 December 2016 with an area on the medial left leg. We discharged her on 12/25/16 however she had to be readmitted on 01/22/17 with a recurrence. I have in my notes that we discharged her on 20-30 mm stockings although she tells me she was only wearing support hose because she cannot get stockings on predominantly related to her cervical spine surgery/issues. She has had previous ablations done by vein and vascular in [place] including a great saphenous vein ablation on the left with an anterior accessory branch ablation I think both of these were in 2016. On one of the previous visit she had a biopsy noted 2009 that was negative. She is not felt to have an arterial issue. She is not a diabetic. She does have a history of obstructive sleep apnea hypertension asthma as well as chronic venous insufficiency and lymphedema. On this occasion she noted 2 dry scaly patch on her left leg. She tried to put lotion on this it didn't really help. There were 2 open areas.the patient has been seeing her primary physician from 02/05/18 through 02/14/18. She had Unna boots applied. The superior wound now on the lateral left leg has closed but she's had one wound that remains open on the lateral left leg. This is not the same spot as we dealt with in 2018. ABIs in this clinic were 1.3 bilaterally 02/26/18; patient has a small wound on the left lateral calf. Dimensions are down. She has chronic venous insufficiency and lymphedema. 03/05/18; small open area  on the left lateral calf. Dimensions are down. Tightly adherent necrotic debris over the surface of the wound which was difficult to remove. Also the  dressing [over collagen] stuck to the wound surface. This was removed with some difficulty as well. Change the primary dressing to Hydrofera Blue ready 03/12/18; small open area on the left lateral calf. Comes in with tightly adherent surface eschar as well as some adherent Hydrofera Blue. 03/19/18; open area on the left lateral calf. Again adherent surface eschar as well as some adherent Hydrofera Blue nonviable subcutaneous tissue. She complained of pain all week even with the reduction from 4-3 layer compression I put on last week. Also she had an increase in her ankle and calf measurements probably related to the same thing. 03/26/18; open area on the left lateral calf. A very small open area remains here. We used silver alginate starting last week as the Hydrofera Blue seem to stick to the wound bed. In using 4-layer compression 04/02/18; the open area in the left lateral calf at some adherent slough which I removed there is no open area here. We are able to transition her into her own compression stocking. Truthfully I think this is probably his support hose. However this does not maintain skin integrity will be limited. She cannot put over the toe compression stockings on because of neck problems hand problems etc. She is allergic to the lining layer of juxta lites. We might be forced to use extremitease stocking should this fail READMIT 11/24/2018 Patient is now a 73 year old woman who is not a diabetic. She has been in this clinic on at least 3 previous occasions largely with recurrent wounds on her left leg secondary to chronic venous insufficiency with secondary lymphedema. Her situation is complicated by inability to get stockings on and an allergy to neoprene which is apparently a component and at least juxta lites and other stockings. As a result she really has not been wearing any stockings on her legs. She tells [place] that roughly 2 or 3 weeks ago she started noticing a stinging sensation  just above her ankle on the left medial aspect. She has been diagnosed with pseudogout and she wondered whether this was what she was experiencing. She tried to dress this with something she bought at the store however subsequently it pulled skin off and now she has an open wound that is not improving. She has been using Vaseline gauze with a cover bandage. She saw her primary doctor last week who put an Haematologist on her. ABIs in this clinic was 1.03 on the left [name], [name]. ([id number]) 2/12; the area is on the left medial ankle. Odd-looking wound with what looks to be surface epithelialization but a multitude of small petechial openings. This clearly not closed yet. We have been using silver alginate under 3 layer compression with TCA 2/19; the wound area did not look quite as good this week. Necrotic debris over the majority of the wound surface which required debridement. She continues to have a multitude of what looked to be small petechial openings. She reminds [place] that she had a biopsy on this initially during her first outbreak in 2015 in [place] dermatology. She expresses concern about this being a possible melanoma. 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 I assured her that this does not look anything like melanoma. Except for the petechial reaction it does look like a venous  insufficiency area and she certainly has evidence of this on both sides 2/26; a difficult area on the left medial ankle. The patient clearly has chronic venous hypertension with some degree of lymphedema. The odd thing about the area is the small petechial hemorrhages. I am not really sure how to explain this. This was present last time and this is not a compression injury. We have been using Hydrofera Blue which I changed to last week 3/4; still using Hydrofera Blue. Aggressive debridement today. She does not have known  arterial issues. She has seen Dr. Kellie Simmering at [facility] vein and vascular and and has an ablation on the left. [Anterior accessory branch of the greater saphenous]. From what I remember they did not feel she had an arterial issue. The patient has had this area biopsied in 2009 at [facility] dermatology and by her recollection they said this was "stasis". She is also follow-up with dermatology locally who thought that this was more of a vascular issue 3/11; using Hydrofera Blue. Aggressive debridement today. She does not have an arterial issue. We are using 3 layer compression although we may need to go to 4. The patient has been in for multiple changes to her wrap since I last saw her a week ago. She says that the area was leaking. I do not have too much more information on what was found 01/19/19 on evaluation today patient was actually being seen for a nurse visit when unfortunately she had the area on her left lateral lower extremity as well as weeping from the right lower extremity that became apparent. Therefore we did end up actually seeing her for a full visit with myself. She is having some pain at this site as well but fortunately nothing too significant at this point. No fevers, chills, nausea, or vomiting noted at this time. 3/18-Patient is back to the clinic with the left leg venous leg ulcer, the ulcer is larger in size, has a surface that is densely adherent with fibrinous tissue, the Hydrofera Blue was used but is densely adherent and there was difficulty in removing it. The right lower extremity was also wrapped for weeping edema. Patient has a new area over the left lateral foot above the malleolus that is small and appears to have no debris with intact surrounding skin. Patient is on increased dose of Lasix also as a means to edema management 3/25; the patient has a nonhealing venous ulcer on the medial left leg and last week developed a smaller area on the lateral left calf. We have  been using Hydrofera Blue with a contact layer. 4/1; no major change in these wounds areas. Left medial and more recently left lateral calf. I tried Iodoflex last week to aid in debridement she did not tolerate this. She stated her pain was terrible all week. She took the top layer of the 4 layer compression off. 4/8; the patient actually looks somewhat better in terms of her more prominent left lateral calf wound. There is some healthy looking tissue here. She is still complaining of a lot of discomfort. 4/15; patient in a lot of pain secondary to sciatica. She is on a prednisone taper prescribed by her primary physician. She has the 2 areas one on the left medial and more recently a smaller area on the left lateral calf. Both of these just above the malleoli 4/22; her back pain is better but she still states she is very uncomfortable and now feels she is intolerant to the [employer].  No real change in the wounds we have been using Sorbact. She has been previously intolerant to Iodoflex. There is not a lot of option about what we can use to debride this wound under compression that she no doubt needs. sHe states Ultram no longer works for her pain 4/29; no major change in the wounds slightly increased depth. Surface on the original medial wound perhaps somewhat improved however the more recent area on the lateral left ankle is 100% covered in very adherent debris we have been using Sorbact. She tolerates 4 layer compression well and her edema control is a lot better. She has not had to come in for a nurse check 5/6; no major change in the condition of the wounds. She did consent to debridement today which was done with some difficulty. Continuing Sorbact. She did not tolerate Iodoflex. She was in for a check of her compression the day after we wrapped her last week this was adjusted but nothing much was found 5/13; no major change in the condition or area of the wounds. I was able to get a fairly  aggressive debridement done on the lateral left leg wound. Even using Sorbact under compression. She came back in on Friday to have the wrap changed. She says she felt uncomfortable on the lateral aspect of her ankle. She has a long history of chronic venous insufficiency including previous ablation surgery on this side. 5/20-Patient returns for wounds on left leg with both wounds covered in slough, with the lateral leg wound larger in size, she has been in 3 layer compression and felt more comfortable, she describes pain in ankle, in leg and pins and needles in foot, and is about to try Pamelor for this 6/3; wounds on the left lateral and left medial leg. The area medially which is the most recent of the 2 seems to have had the largest increase in dimensions. We have been using Sorbac to try and debride the surface. She has been to see orthopedics they apparently did a plain x-ray that was indeterminant. Diagnosed her with neuropathy and they have ordered an MRI to [name], [name]. ([id number]) determine if there is underlying osteomyelitis. This was not high on my thought list but I suppose it is prudent. We have advised her to make an appointment with vein and vascular in [place]. She has a history of a left greater saphenous and accessory vein ablations I wonder if there is anything else that can be done from a surgical point of view to help in these difficult refractory wounds. We have previously healed this wound on one occasion but it keeps on reopening [medial side] 6/10; deep tissue culture I did last week I think on the left medial wound showed both moderate E. coli and moderate staph aureus [MSSA]. She is going to require antibiotics and I have chosen Augmentin. We have been using Sorbact and we have made better looking wound surface on both sides but certainly no improvement in wound area. She was back in last Friday apparently for a dressing changes the wrap was hurting her outer  left ankle. She has not managed to get a hold of vein and vascular in [place]. We are going to have to make her that appointment 6/17; patient is tolerating the Augmentin. She had an MRI that I think was ordered by orthopedic surgeon this did not show osteomyelitis or an abscess did suggest cellulitis. We have been using Sorbact to the lateral and medial ankles. We have been  trying to arrange a follow-up appointment with vein and vascular in [place] or did her original ablations. We apparently an area sent the request to vein and vascular in [facility] 6/24; patient has completed the Augmentin. We do not yet have a vein and vascular appointment in [place]. I am not sure what the issue is here we have asked her to call tomorrow. We are using Sorbact. Making some improvements and especially the medial wound. Both surfaces however look better medial and lateral. 7/1; the patient has been in contact with vein and vascular in [place] but has not yet received an appointment. Using Sorbact we have gradually improve the wound surface with no improvement in surface area. She is approved for Apligraf but the wound surface still is not completely viable. She has not had to come in for a dressing change 7/8; the patient has an appointment with vein and vascular on 7/31 which is a Friday afternoon. She is concerned about getting back here for [place] to dress her wounds. I think it is important to have them goal for her venous reflux/history of ablations etc. to see if anything else can be done. She apparently tested positive for 1 of the blood tests with regards to lupus and saw a rheumatologist. He has raised the issue of vasculitis again. I have had this thought in the past however the evidence seems overwhelming that this is a venous reflux etiology. If the rheumatologist tells me there is clinical and laboratory investigation is positive for lupus I will rethink this. 7/15; the patient's wound  surfaces are quite a bit better. The medial area which was her original wound now has no depth although the lateral wound which was the more recent area actually appears larger. Both with viable surfaces which is indeed better. Using Sorbact. I wanted to use Apligraf on her however there is the issue of the vein and vascular appointment on 7/31 at 2:00 in the afternoon which would not allow her to get back to be rewrapped and they would no doubt remove the graft 7/22; the patient's wound surfaces have moderate amount of debris although generally look better. The lateral one is larger with 2 small satellite areas superiorly. We are waiting for her vein and vascular appointment on 7/31. She has been approved for Apligraf which I would like to use after th 7/29; wound surfaces have improved no debridement is required we have been using Sorbact. She sees vein and vascular on Friday with this so question of whether anything can be done to lessen the likelihood of recurrence and/or speed the healing of these areas. She is already had previous ablations. She no doubt has severe venous hypertension 8/5-Patient returns at 1 week, she was in [place] for 3 days by her podiatrist, we have been using so backed to the wound, she has increased pain in both the wounds on the left lower leg especially the more distal one on the lateral aspect 8/12-Patient returns at 1 week and she is agreeable to having debridement in both wounds on her left leg today. We have been using Sorbact, and vascular studies were reviewed at last visit 8/19; the patient arrives with her wounds fairly clean and no debridement is required. We have used Sorbact which is really done a nice job in cleaning up these very difficult wound surfaces. The patient saw Dr. Donzetta Matters of vascular surgery on 7/31. He did not feel that there was an arterial component. He felt that her treated greater saphenous  vein is adequately addressed and that the small  saphenous vein did not appear to be involved significantly. She was also noted to have deep venous reflux which is not treatable. Dr. Donzetta Matters mentioned the possibility of a central obstructive component leading to reflux and he offered her central venography. She wanted to discuss this or think about it. I have urged her to go ahead with this. She has had recurrent difficult wounds in these areas which do heal but after months in the clinic. If there is anything that can be done to reduce the likelihood of this I think it is worth it. 9/2 she is still working towards getting follow-up with Dr. Donzetta Matters to schedule her CT. Things are quite a bit worse venography. I put Apligraf on 2 weeks ago on both wounds on the medial and lateral part of her left lower leg. She arrives in clinic today with 3 superficial additional wounds above the area laterally and one below the wound medially. She describes a lot of discomfort. I think these are probably wrapped injuries. Does not look like she has cellulitis. 07/20/2019 on evaluation today patient appears to be doing somewhat poorly in regard to her lower extremity ulcers. She in fact showed signs of erythema in fact we may even be dealing with an infection at this time. Unfortunately I am unsure if this is just infection or if indeed there may be some allergic reaction that occurred as a result of the Apligraf application. With that being said that would be unusual but nonetheless not impossible in this patient is one who is unfortunately allergic to quite a bit. Currently we have been using the Sorbact which seems to do as well as anything for her. I do think we may want to obtain a culture today to see if there is anything showing up there that may need to be addressed. [name], [name] 9/16; noted that last week the wounds look worse in 1 week follow-up of the Apligraf. Using Sorbact as of 2 days ago. She arrives with copious amounts of drainage and new  skin breakdown on the back of the left calf. The wounds arm more substantial bilaterally. There is a fair amount of swelling in the left calf no overt DVT there is edema present I think in the left greater than right thigh. She is supposed to go on 9/28 for CT venography. The wounds on the medial and lateral calf are worse and she has new skin breakdown posteriorly at least new for me. This is almost developing into a circumferential wound area The Apligraf was taken off last week which I agree with things are not going in the right direction a culture was done we do not have that back yet. She is on Augmentin that she started 2 days ago 9/23; dressing was changed by her nurses on Monday. In general there is no improvement in the wound areas although the area looks less angry than last week. She did get Augmentin for MSSA cultured on the 14th. She still appears to have too much swelling in the left leg even with 3 layer compression 9/30; the patient underwent her procedure on 9/28 by Dr. Donzetta Matters [facility]. She was discovered to have the common iliac vein measuring 12.2 mm but at the level of L4-L5 measured 3 mm. After stenting it measured 10 mm. 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 Rouleaux flow in the common femoral and femoral vein was  observed much improved after stenting. We are using silver alginate to the wounds on the medial and lateral ankle on the left. 4 layer compression 10/7; the patient had fluid swelling around her knee and 4 layer compression. At the advice of vein and vascular this was reduced to 3 layer which she is tolerating better. We have been using silver alginate under 3 layer compression since last Friday 10/14; arrives with the areas on the left ankle looking a lot better. Inflammation in the area also a lot better. She came in for a nurse check on 10/9 10/21; continued nice improvement. Slight improvements in surface area of both  the medial and lateral wounds on the left. A lot of the satellite lesions in the weeping erythema around these from stasis dermatitis is resolved. We have been using silver alginate 10/28; general improvement in the entire wound areas although not a lot of change in dimensions the wound certainly looks better. There is a lot less in terms of venous inflammation. Continue silver alginate this week however look towards Hydrofera Blue next week 11/4; very adherent debris on the medial wound left wound is not as bad. We have been using silver alginate. Change to [facility] today 11/11; very adherent debris on both wound areas. She went to vein and vascular last week and follow-up they put in Hamler boot on this today. He says the [facility] was adherent. Wound is definitely not as good as last week. Especially on the left there the satellite lesions look more prominent Electronic Signature(s) Signed: 09/16/2019 5:41:49 PM By: Linton Ham MD Entered By: Linton Ham on 09/16/2019 17:35:55 Bonneau, [name] -------------------------------------------------------------------------------- Physical Exam Details Patient Name: KENZLEIGH, WEISBRODT. Date of Service: 09/16/2019 2:00 PM Medical Record Number: [id number] Patient Account Number: [id number] Date of Birth/Sex: May 22, 1946 (73 y.o. F) Treating RN: Cornell Barman Primary Care Provider: [name] Other Clinician: Referring Provider: [name] Treating Provider/Extender: Tito Dine in Treatment: 40 Notes Wound exam; debridement of both the left medial and left lateral of tightly adherent fibrinous debris. Hemostasis with direct pressure. The satellite lesion superiorly to the area on the left look more prominent. The edema control looks about the same there is no evidence of infection Electronic Signature(s) Signed: 09/16/2019 5:41:49 PM By: Linton Ham MD Entered By: Linton Ham on  09/16/2019 17:36:31 Obrien, [name] -------------------------------------------------------------------------------- Physician Orders Details Patient Name: LALINDA, KHAMVONGSA. Date of Service: 09/16/2019 2:00 PM Medical Record Number: [id number] Patient Account Number: [id number] Date of Birth/Sex: [birth year] (73 y.o. F) Treating RN: Cornell Barman Primary Care Provider: [name] Other Clinician: Referring Provider: [name] Treating Provider/Extender: Tito Dine in Treatment: 28 Verbal / Phone Orders: No Diagnosis Coding Wound Cleansing Wound #5 Left,Medial Lower Leg o Cleanse wound with mild soap and water Wound #6 Left,Lateral Lower Leg o Cleanse wound with mild soap and water Wound #9 Left,Lateral,Posterior Lower Leg o Cleanse wound with mild soap and water Anesthetic (add to Medication List) Wound #5 Left,Medial Lower Leg o Topical Lidocaine 4% cream applied to wound bed prior to debridement (In Clinic Only). Wound #6 Left,Lateral Lower Leg o Topical Lidocaine 4% cream applied to wound bed prior to debridement (In Clinic Only). Wound #9 Left,Lateral,Posterior Lower Leg o Topical Lidocaine 4% cream applied to wound bed prior to debridement (In Clinic Only). Skin Barriers/Peri-Wound Care Wound #5 Left,Medial Lower Leg o Triamcinolone Acetonide Ointment (TCA) Wound #6 Left,Lateral Lower Leg o Triamcinolone Acetonide Ointment (TCA) Wound #9 Left,Lateral,Posterior  Lower Leg o Triamcinolone Acetonide Ointment (TCA) Primary Wound Dressing Wound #5 Left,Medial Lower Leg o Mepitel One Contact layer o Hydrafera Blue Ready Transfer Wound #6 Left,Lateral Lower Leg o Mepitel One Contact layer o Hydrafera Blue Ready Transfer Wound #9 Left,Lateral,Posterior Lower Leg o Mepitel One Contact layer o Hydrafera Blue Ready Transfer Secondary Dressing [name], [name] Wound #5 Left,Medial Lower Leg o  Drawtex Wound #6 Left,Lateral Lower Leg o Drawtex Wound #9 Left,Lateral,Posterior Lower Leg o Drawtex Dressing Change Frequency Wound #5 Left,Medial Lower Leg o Change dressing every week o Other: - as needed. Wound #6 Left,Lateral Lower Leg o Change dressing every week o Other: - as needed. Wound #9 Left,Lateral,Posterior Lower Leg o Change dressing every week o Other: - as needed. Follow-up Appointments Wound #5 Left,Medial Lower Leg o Return Appointment in 1 week. o Nurse Visit as needed - Call of needed Wound #6 Left,Lateral Lower Leg o Return Appointment in 1 week. o Nurse Visit as needed - Call of needed Wound #9 Left,Lateral,Posterior Lower Leg o Return Appointment in 1 week. o Nurse Visit as needed - Call of needed Edema Control Wound #5 Left,Medial Lower Leg o 3 Layer Compression System - Left Lower Extremity - unna to anchor Wound #6 Left,Lateral Lower Leg o 3 Layer Compression System - Left Lower Extremity - unna to anchor Wound #9 Left,Lateral,Posterior Lower Leg o 3 Layer Compression System - Left Lower Extremity - unna to anchor Notes [place] VOB Electronic Signature(s) Signed: 09/16/2019 5:21:25 PM By: [name], [name], RN, CWS, [name], [name] Signed: 09/16/2019 5:41:49 PM By: Linton Ham MD Entered By: [name], [name], RN, CWS, Kim on 09/16/2019 15:21:58 [name], [name] -------------------------------------------------------------------------------- Problem List Details Patient Name: YUKA, SIRAVO. Date of Service: 09/16/2019 2:00 PM Medical Record Number: [id number] Patient Account Number: [id number] Date of Birth/Sex: November 20, 1945 (73 y.o. F) Treating RN: Cornell Barman Primary Care Provider: [name] Other Clinician: Referring Provider: [name] Treating Provider/Extender: Tito Dine in Treatment: 40 Active Problems ICD-10 Evaluated Encounter Code Description Active Date Today  Diagnosis L97.221 Non-pressure chronic ulcer of left calf limited to breakdown of 01/07/2019 No Yes skin I87.321 Chronic venous hypertension (idiopathic) with inflammation of 12/10/2018 No Yes right lower extremity I89.0 Lymphedema, not elsewhere classified 12/10/2018 No Yes Inactive Problems Resolved Problems Electronic Signature(s) Signed: 09/16/2019 5:41:49 PM By: Linton Ham MD Entered By: Linton Ham on 09/16/2019 17:34:30 Freeland, [name] -------------------------------------------------------------------------------- Progress Note Details Patient Name: JOSSELYN, BURST. Date of Service: 09/16/2019 2:00 PM Medical Record Number: [id number] Patient Account Number: [id number] Date of Birth/Sex: 09/27/1946 (73 y.o. F) Treating RN: Cornell Barman Primary Care Provider: [name] Other Clinician: Referring Provider: [name] Treating Provider/Extender: Tito Dine in Treatment: 40 Subjective History of Present Illness (HPI) Pleasant 73 year old with history of chronic venous insufficiency. No diabetes or peripheral vascular disease. Left ABI 1.29. Questionable history of left lower extremity DVT. She developed a recurrent ulceration on her left lateral calf in December 2015, which she attributes to poor diet and subsequent lower extremity edema. She underwent endovenous laser ablation of her left greater saphenous vein in 2010. She underwent laser ablation of accessory branch of left GSV in April 2016 by Dr. Kellie Simmering at [facility]. She was previously wearing Unna boots, which she tolerated well. Tolerating 2 layer compression and cadexomer iodine. She returns to clinic for follow-up and is without new complaints. She denies any significant pain at this time. She reports persistent pain with pressure. No claudication  or ischemic rest pain. No fever or chills. No drainage. READMISSION 11/13/16; this is a 73 year old woman who is not a diabetic. She is  here for a review of a painful area on her left medial lower extremity. I note that she was seen here previously last year for wound I believe to be in the same area. At that time she had undergone previously a left greater saphenous vein ablation by Dr. Kellie Simmering and she had a ablation of the anterior accessory branch of the left greater saphenous vein in March 2016. Seeing that the wound actually closed over. In reviewing the history with her today the ulcer in this area has been recurrent. She describes a biopsy of this area in 2009 that only showed stasis physiology. 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 Do not see that she has ever had arterial studies in the left leg. She tells me she has a follow-up with Dr. Kellie Simmering in roughly 10 days In any case she developed the reopening of this area roughly a month ago. On the background of this she describes rapidly increasing edema which has responded to Lasix 40 mg and metolazone 2.5 mg as well as the patient's lymph massage. She has been told she has both venous insufficiency and lymphedema but she cannot tolerate compression stockings 11/28/16; the patient saw Dr. Kellie Simmering recently. Per the patient he did arterial Dopplers in the office that did not show evidence of arterial insufficiency, per the patient he stated "treat this like an ordinary venous ulcer". She also saw her dermatologist Dr. Ronnald Ramp who felt that this was more of a vascular ulcer. In general things are improving although she arrives today with increasing bilateral lower extremity edema with weeping a deeper fluid through the wound on the left medial leg compatible with some degree of lymphedema 12/04/16; the patient's wound is fully epithelialized but I don't think fully  healed. We will do another week of depression with Promogran and TCA however I suspect we'll be able to discharge her next week. This is a very unusual-looking wound which was initially a figure-of-eight type wound lying on its side surrounded by petechial like hemorrhage. She has had venous ablation on this side. She apparently does not have an arterial issue per Dr. Kellie Simmering. She saw her dermatologist thought it was "vascular". Patient is definitely going to need ongoing compression and I talked about this with her today she will go to elastic therapy after she leaves here next week 12/11/16; the patient's wound is not completely closed today. She has surrounding scar tissue and in further discussion with the patient it would appear that she had ulcers in this area in 2009 for a prolonged period of time ultimately requiring a punch biopsy of this area that only showed venous insufficiency. I did not previously pickup on this part of the history from the patient. 12/18/16; the patient's wound is completely epithelialized. There is no open area here. She has significant bilateral venous insufficiency with secondary lymphedema to a mild-to-moderate degree she does not have compression stockings.. She did not say anything to me when I was in the room, she told our intake nurse that she was still having pain in this area. This isn't unusual recurrent small open area. She is going  to go to elastic therapy to obtain compression stockings. 12/25/16; the patient's wound is fully epithelialized. There is no open area here. The patient describes some continued episodic discomfort in this area medial left calf. However everything looks fine and healed here. She is been to elastic therapy and ASTARA, MAROCCO ([id number]) caught herself 15-20 mmHg stockings, they apparently were having trouble getting 20-30 mm stockings in her size 01/22/17; this is a patient we discharged from the clinic a month ago. She has a  recurrent open wound on her medial left calf. She had 15 mm support stockings. I told her I thought she needed 20-30 mm compression stockings. She tells me that she has been ill with hospitalization secondary to asthma and is been found to have severe hypokalemia likely secondary to a combination of Lasix and metolazone. This morning she noted blistering and leaking fluid on the posterior part of her left leg. She called our intake nurse urgently and we was saw her this afternoon. She has not had any real discomfort here. I don't know that she's been wearing any stockings on this leg for at least 2-3 days. ABIs in this clinic were 1.21 on the right and 1.3 on the left. She is previously seen vascular surgery who does not think that there is a peripheral arterial issue. 01/30/17; Patient arrives with no open wound on the left leg. She has been to elastic therapy and obtained 20-41mmhg below knee stockings and she has one on the right leg today. READMISSION 02/19/18; this Ghosh is a now 73 year old patient we've had in this clinic perhaps 3 times before. I had last looked at her from January 07 December 2016 with an area on the medial left leg. We discharged her on 12/25/16 however she had to be readmitted on 01/22/17 with a recurrence. I have in my notes that we discharged her on 20-30 mm stockings although she tells me she was only wearing support hose because she cannot get stockings on predominantly related to her cervical spine surgery/issues. She has had previous ablations done by vein and vascular in [place] including a great saphenous vein ablation on the left with an anterior accessory branch ablation I think both of these were in 2016. On one of the previous visit she had a biopsy noted 2009 that was negative. She is not felt to have an arterial issue. She is not a diabetic. She does have a history of obstructive sleep apnea hypertension asthma as well as chronic venous insufficiency and  lymphedema. On this occasion she noted 2 dry scaly patch on her left leg. She tried to put lotion on this it didn't really help. There were 2 open areas.the patient has been seeing her primary physician from 02/05/18 through 02/14/18. She had Unna boots applied. The superior wound now on the lateral left leg has closed but she's had one wound that remains open on the lateral left leg. This is not the same spot as we dealt with in 2018. ABIs in this clinic were 1.3 bilaterally 02/26/18; patient has a small wound on the left lateral calf. Dimensions are down. She has chronic venous insufficiency and lymphedema. 03/05/18; small open area on the left lateral calf. Dimensions are down. Tightly adherent necrotic debris over the surface of the wound which was difficult to remove. Also the dressing [over collagen] stuck to the wound surface. This was removed with some difficulty as well. Change the primary dressing to Hydrofera Blue ready 03/12/18; small open area on the  left lateral calf. Comes in with tightly adherent surface eschar as well as some adherent Hydrofera Blue. 03/19/18; open area on the left lateral calf. Again adherent surface eschar as well as some adherent Hydrofera Blue nonviable subcutaneous tissue. She complained of pain all week even with the reduction from 4-3 layer compression I put on last week. Also she had an increase in her ankle and calf measurements probably related to the same thing. 03/26/18; open area on the left lateral calf. A very small open area remains here. We used silver alginate starting last week as the Hydrofera Blue seem to stick to the wound bed. In using 4-layer compression 04/02/18; the open area in the left lateral calf at some adherent slough which I removed there is no open area here. We are able to transition her into her own compression stocking. Truthfully I think this is probably his support hose. However this does not maintain skin integrity will be limited.  She cannot put over the toe compression stockings on because of neck problems hand problems etc. She is allergic to the lining layer of juxta lites. We might be forced to use extremitease stocking should this fail READMIT 11/24/2018 Patient is now a 73 year old woman who is not a diabetic. She has been in this clinic on at least 3 previous occasions largely with recurrent wounds on her left leg secondary to chronic venous insufficiency with secondary lymphedema. Her situation is complicated by inability to get stockings on and an allergy to neoprene which is apparently a component and at least juxta lites and other stockings. As a result she really has not been wearing any stockings on her legs. She tells [place] that roughly 2 or 3 weeks ago she started noticing a stinging sensation just above her ankle on the left medial aspect. She has been diagnosed with pseudogout and she wondered whether this was what she was experiencing. She tried to dress this with something she bought at the store however subsequently it pulled skin off and now she has an open wound that is not improving. She has been using Vaseline gauze with a cover bandage. She saw her primary doctor last week who put an Haematologist on her. ABIs in this clinic was 1.03 on the left IZARA, GRAHOVAC. ([id number]) 2/12; the area is on the left medial ankle. Odd-looking wound with what looks to be surface epithelialization but a multitude of small petechial openings. This clearly not closed yet. We have been using silver alginate under 3 layer compression with TCA 2/19; the wound area did not look quite as good this week. Necrotic debris over the majority of the wound surface which required debridement. She continues to have a multitude of what looked to be small petechial openings. She reminds [place] that she had a biopsy on this initially during her first outbreak in 2015 in [place] [facility]. She expresses concern about this being a possible  melanoma. 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 I assured her that this does not look anything like melanoma. Except for the petechial reaction it does look like a venous insufficiency area and she certainly has evidence of this on both sides 2/26; a difficult area on the left medial ankle. The patient clearly has chronic venous hypertension with some degree of lymphedema. The odd thing about the area is the small petechial hemorrhages. I am not really sure how to explain this. This was  present last time and this is not a compression injury. We have been using Hydrofera Blue which I changed to last week 3/4; still using Hydrofera Blue. Aggressive debridement today. She does not have known arterial issues. She has seen Dr. Kellie Simmering at [facility] vein and vascular and and has an ablation on the left. [Anterior accessory branch of the greater saphenous]. From what I remember they did not feel she had an arterial issue. The patient has had this area biopsied in 2009 at [facility] dermatology and by her recollection they said this was "stasis". She is also follow-up with dermatology locally who thought that this was more of a vascular issue 3/11; using Hydrofera Blue. Aggressive debridement today. She does not have an arterial issue. We are using 3 layer compression although we may need to go to 4. The patient has been in for multiple changes to her wrap since I last saw her a week ago. She says that the area was leaking. I do not have too much more information on what was found 01/19/19 on evaluation today patient was actually being seen for a nurse visit when unfortunately she had the area on her left lateral lower extremity as well as weeping from the right lower extremity that became apparent. Therefore we did end up actually seeing her for a full visit with myself. She is having some pain at this site as well  but fortunately nothing too significant at this point. No fevers, chills, nausea, or vomiting noted at this time. 3/18-Patient is back to the clinic with the left leg venous leg ulcer, the ulcer is larger in size, has a surface that is densely adherent with fibrinous tissue, the Hydrofera Blue was used but is densely adherent and there was difficulty in removing it. The right lower extremity was also wrapped for weeping edema. Patient has a new area over the left lateral foot above the malleolus that is small and appears to have no debris with intact surrounding skin. Patient is on increased dose of Lasix also as a means to edema management 3/25; the patient has a nonhealing venous ulcer on the medial left leg and last week developed a smaller area on the lateral left calf. We have been using Hydrofera Blue with a contact layer. 4/1; no major change in these wounds areas. Left medial and more recently left lateral calf. I tried Iodoflex last week to aid in debridement she did not tolerate this. She stated her pain was terrible all week. She took the top layer of the 4 layer compression off. 4/8; the patient actually looks somewhat better in terms of her more prominent left lateral calf wound. There is some healthy looking tissue here. She is still complaining of a lot of discomfort. 4/15; patient in a lot of pain secondary to sciatica. She is on a prednisone taper prescribed by her primary physician. She has the 2 areas one on the left medial and more recently a smaller area on the left lateral calf. Both of these just above the malleoli 4/22; her back pain is better but she still states she is very uncomfortable and now feels she is intolerant to the [employer]. No real change in the wounds we have been using Sorbact. She has been previously intolerant to Iodoflex. There is not a lot of option about what we can use to debride this wound under compression that she no doubt needs. sHe states Ultram  no longer works for her pain 4/29; no major change in  the wounds slightly increased depth. Surface on the original medial wound perhaps somewhat improved however the more recent area on the lateral left ankle is 100% covered in very adherent debris we have been using Sorbact. She tolerates 4 layer compression well and her edema control is a lot better. She has not had to come in for a nurse check 5/6; no major change in the condition of the wounds. She did consent to debridement today which was done with some difficulty. Continuing Sorbact. She did not tolerate Iodoflex. She was in for a check of her compression the day after we wrapped her last week this was adjusted but nothing much was found 5/13; no major change in the condition or area of the wounds. I was able to get a fairly aggressive debridement done on the lateral left leg wound. Even using Sorbact under compression. She came back in on Friday to have the wrap changed. She says she felt uncomfortable on the lateral aspect of her ankle. She has a long history of chronic venous insufficiency including previous ablation surgery on this side. 5/20-Patient returns for wounds on left leg with both wounds covered in slough, with the lateral leg wound larger in size, she has been in 3 layer compression and felt more comfortable, she describes pain in ankle, in leg and pins and needles in foot, and is about to try Pamelor for this 6/3; wounds on the left lateral and left medial leg. The area medially which is the most recent of the 2 seems to have had the largest increase in dimensions. We have been using Sorbac to try and debride the surface. She has been to see orthopedics LOMA, PFISTERER ([id number]) they apparently did a plain x-ray that was indeterminant. Diagnosed her with neuropathy and they have ordered an MRI to determine if there is underlying osteomyelitis. This was not high on my thought list but I suppose it is prudent. We  have advised her to make an appointment with vein and vascular in [place]. She has a history of a left greater saphenous and accessory vein ablations I wonder if there is anything else that can be done from a surgical point of view to help in these difficult refractory wounds. We have previously healed this wound on one occasion but it keeps on reopening [medial side] 6/10; deep tissue culture I did last week I think on the left medial wound showed both moderate E. coli and moderate staph aureus [MSSA]. She is going to require antibiotics and I have chosen Augmentin. We have been using Sorbact and we have made better looking wound surface on both sides but certainly no improvement in wound area. She was back in last Friday apparently for a dressing changes the wrap was hurting her outer left ankle. She has not managed to get a hold of vein and vascular in [place]. We are going to have to make her that appointment 6/17; patient is tolerating the Augmentin. She had an MRI that I think was ordered by orthopedic surgeon this did not show osteomyelitis or an abscess did suggest cellulitis. We have been using Sorbact to the lateral and medial ankles. We have been trying to arrange a follow-up appointment with vein and vascular in [place] or did her original ablations. We apparently an area sent the request to vein and vascular in [facility] 6/24; patient has completed the Augmentin. We do not yet have a vein and vascular appointment in [place]. I am not sure what the issue is  here we have asked her to call tomorrow. We are using Sorbact. Making some improvements and especially the medial wound. Both surfaces however look better medial and lateral. 7/1; the patient has been in contact with vein and vascular in [place] but has not yet received an appointment. Using Sorbact we have gradually improve the wound surface with no improvement in surface area. She is approved for Apligraf but the  wound surface still is not completely viable. She has not had to come in for a dressing change 7/8; the patient has an appointment with vein and vascular on 7/31 which is a Friday afternoon. She is concerned about getting back here for [place] to dress her wounds. I think it is important to have them goal for her venous reflux/history of ablations etc. to see if anything else can be done. She apparently tested positive for 1 of the blood tests with regards to lupus and saw a rheumatologist. He has raised the issue of vasculitis again. I have had this thought in the past however the evidence seems overwhelming that this is a venous reflux etiology. If the rheumatologist tells me there is clinical and laboratory investigation is positive for lupus I will rethink this. 7/15; the patient's wound surfaces are quite a bit better. The medial area which was her original wound now has no depth although the lateral wound which was the more recent area actually appears larger. Both with viable surfaces which is indeed better. Using Sorbact. I wanted to use Apligraf on her however there is the issue of the vein and vascular appointment on 7/31 at 2:00 in the afternoon which would not allow her to get back to be rewrapped and they would no doubt remove the graft 7/22; the patient's wound surfaces have moderate amount of debris although generally look better. The lateral one is larger with 2 small satellite areas superiorly. We are waiting for her vein and vascular appointment on 7/31. She has been approved for Apligraf which I would like to use after th 7/29; wound surfaces have improved no debridement is required we have been using Sorbact. She sees vein and vascular on Friday with this so question of whether anything can be done to lessen the likelihood of recurrence and/or speed the healing of these areas. She is already had previous ablations. She no doubt has severe venous hypertension 8/5-Patient returns at 1  week, she was in [place] for 3 days by her podiatrist, we have been using so backed to the wound, she has increased pain in both the wounds on the left lower leg especially the more distal one on the lateral aspect 8/12-Patient returns at 1 week and she is agreeable to having debridement in both wounds on her left leg today. We have been using Sorbact, and vascular studies were reviewed at last visit 8/19; the patient arrives with her wounds fairly clean and no debridement is required. We have used Sorbact which is really done a nice job in cleaning up these very difficult wound surfaces. The patient saw Dr. Donzetta Matters of vascular surgery on 7/31. He did not feel that there was an arterial component. He felt that her treated greater saphenous vein is adequately addressed and that the small saphenous vein did not appear to be involved significantly. She was also noted to have deep venous reflux which is not treatable. Dr. Donzetta Matters mentioned the possibility of a central obstructive component leading to reflux and he offered her central venography. She wanted to discuss this or  think about it. I have urged her to go ahead with this. She has had recurrent difficult wounds in these areas which do heal but after months in the clinic. If there is anything that can be done to reduce the likelihood of this I think it is worth it. 9/2 she is still working towards getting follow-up with Dr. Donzetta Matters to schedule her CT. Things are quite a bit worse venography. I put Apligraf on 2 weeks ago on both wounds on the medial and lateral part of her left lower leg. She arrives in clinic today with 3 superficial additional wounds above the area laterally and one below the wound medially. She describes a lot of discomfort. I think these are probably wrapped injuries. Does not look like she has cellulitis. 07/20/2019 on evaluation today patient appears to be doing somewhat poorly in regard to her lower extremity ulcers. She in fact  showed signs of erythema in fact we may even be dealing with an infection at this time. Unfortunately I am unsure if this is just infection or if indeed there may be some allergic reaction that occurred as a result of the Apligraf application. With that being said that would be unusual but nonetheless not impossible in this patient is one who is unfortunately allergic to quite a bit. Currently we have been using the Sorbact which seems to do as well as anything for her. I do think we may want to obtain ROLAYNE, SCHAIBLE ([id number]) a culture today to see if there is anything showing up there that may need to be addressed. 9/16; noted that last week the wounds look worse in 1 week follow-up of the Apligraf. Using Sorbact as of 2 days ago. She arrives with copious amounts of drainage and new skin breakdown on the back of the left calf. The wounds arm more substantial bilaterally. There is a fair amount of swelling in the left calf no overt DVT there is edema present I think in the left greater than right thigh. She is supposed to go on 9/28 for CT venography. The wounds on the medial and lateral calf are worse and she has new skin breakdown posteriorly at least new for me. This is almost developing into a circumferential wound area The Apligraf was taken off last week which I agree with things are not going in the right direction a culture was done we do not have that back yet. She is on Augmentin that she started 2 days ago 9/23; dressing was changed by her nurses on Monday. In general there is no improvement in the wound areas although the area looks less angry than last week. She did get Augmentin for MSSA cultured on the 14th. She still appears to have too much swelling in the left leg even with 3 layer compression 9/30; the patient underwent her procedure on 9/28 by Dr. Donzetta Matters at vascular and vein specialist. She was discovered to have the common iliac vein measuring 12.2 mm but at the level of  L4-L5 measured 3 mm. After stenting it measured 10 mm. 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 Rouleaux flow in the common femoral and femoral vein was observed much improved after stenting. We are using silver alginate to the wounds on the medial and lateral ankle on the left. 4 layer compression 10/7; the patient had fluid swelling around her knee and 4 layer compression. At the advice of vein and vascular this was reduced to 3 layer which she is tolerating  better. We have been using silver alginate under 3 layer compression since last Friday 10/14; arrives with the areas on the left ankle looking a lot better. Inflammation in the area also a lot better. She came in for a nurse check on 10/9 10/21; continued nice improvement. Slight improvements in surface area of both the medial and lateral wounds on the left. A lot of the satellite lesions in the weeping erythema around these from stasis dermatitis is resolved. We have been using silver alginate 10/28; general improvement in the entire wound areas although not a lot of change in dimensions the wound certainly looks better. There is a lot less in terms of venous inflammation. Continue silver alginate this week however look towards Hydrofera Blue next week 11/4; very adherent debris on the medial wound left wound is not as bad. We have been using silver alginate. Change to [facility] today 11/11; very adherent debris on both wound areas. She went to vein and vascular last week and follow-up they put in [place] boot on this today. He says the [facility] was adherent. Wound is definitely not as good as last week. Especially on the left there the satellite lesions look more prominent Objective Constitutional Vitals Time Taken: 2:12 PM, Height: 63 in, Weight: 224.7 lbs, BMI: 39.8, Temperature: 99.0 F, Pulse: 89 bpm, Respiratory Rate: 16 breaths/min, Blood Pressure: 138/71 mmHg. Integumentary (Hair, Skin) Wound #5  status is Open. Original cause of wound was Gradually Appeared. The wound is located on the Left,Medial Lower Leg. The wound measures 2.6cm length x 2.4cm width x 0.2cm depth; 4.901cm^2 area and 0.98cm^3 volume. There is Fat Layer (Subcutaneous Tissue) Exposed exposed. There is no tunneling or undermining noted. There is a large amount of serosanguineous drainage noted. The wound margin is flat and intact. There is medium (34-66%) pale granulation within the wound bed. There is a medium (34-66%) amount of necrotic tissue within the wound bed including Adherent Slough. GERARDA, BUHMAN ([id number]) Wound #6 status is Open. Original cause of wound was Gradually Appeared. The wound is located on the Left,Lateral Lower Leg. The wound measures 4.5cm length x 3cm width x 0.2cm depth; 10.603cm^2 area and 2.121cm^3 volume. There is Fat Layer (Subcutaneous Tissue) Exposed exposed. There is no tunneling or undermining noted. There is a large amount of serosanguineous drainage noted. The wound margin is flat and intact. There is small (1-33%) pink granulation within the wound bed. There is a large (67-100%) amount of necrotic tissue within the wound bed including Adherent Slough. Wound #9 status is Open. Original cause of wound was Gradually Appeared. The wound is located on the Left,Lateral,Posterior Lower Leg. The wound measures 1.5cm length x 2.5cm width x 0.1cm depth; 2.945cm^2 area and 0.295cm^3 volume. There is Fat Layer (Subcutaneous Tissue) Exposed exposed. There is no tunneling or undermining noted. There is a large amount of serosanguineous drainage noted. The wound margin is flat and intact. There is large (67- 100%) pink granulation within the wound bed. There is a small (1-33%) amount of necrotic tissue within the wound bed including Adherent Slough. Assessment Active Problems ICD-10 Non-pressure chronic ulcer of left calf limited to breakdown of skin Chronic venous hypertension (idiopathic)  with inflammation of right lower extremity Lymphedema, not elsewhere classified Procedures Wound #5 Pre-procedure diagnosis of Wound #5 is a Lymphedema located on the Left,Medial Lower Leg . There was a Excisional Skin/Subcutaneous Tissue Debridement with a total area of 6.24 sq cm performed by [name] Dillon, MD. With the following instrument(s):  Curette to remove Viable and Non-Viable tissue/material. Material removed includes Subcutaneous Tissue and Slough and after achieving pain control using Lidocaine. A time out was conducted at 15:14, prior to the start of the procedure. A Minimum amount of bleeding was controlled with Pressure. The procedure was tolerated well. Post Debridement Measurements: 2.6cm length x 2.4cm width x 0.3cm depth; 1.47cm^3 volume. Character of Wound/Ulcer Post Debridement is stable. Post procedure Diagnosis Wound #5: Same as Pre-Procedure Wound #6 Pre-procedure diagnosis of Wound #6 is a Venous Leg Ulcer located on the Left,Lateral Lower Leg .Severity of Tissue Pre Debridement is: Fat layer exposed. There was a Excisional Skin/Subcutaneous Tissue Debridement with a total area of 13.5 sq cm performed by [name] Dillon, MD. With the following instrument(s): Curette to remove Viable and Non-Viable tissue/material. Material removed includes Subcutaneous Tissue and Slough and after achieving pain control using Lidocaine. A time out was conducted at 15:14, prior to the start of the procedure. A Minimum amount of bleeding was controlled with Pressure. The procedure was tolerated well. Post Debridement Measurements: 4.5cm length x 3cm width x 0.3cm depth; 3.181cm^3 volume. Character of Wound/Ulcer Post Debridement is stable. Severity of Tissue Post Debridement is: Fat layer exposed. Post procedure Diagnosis Wound #6: Same as Pre-Procedure [name], [name] Plan Wound Cleansing: Wound #5 Left,Medial Lower Leg: Cleanse wound with mild soap and water Wound  #6 Left,Lateral Lower Leg: Cleanse wound with mild soap and water Wound #9 Left,Lateral,Posterior Lower Leg: Cleanse wound with mild soap and water Anesthetic (add to Medication List): Wound #5 Left,Medial Lower Leg: Topical Lidocaine 4% cream applied to wound bed prior to debridement (In Clinic Only). Wound #6 Left,Lateral Lower Leg: Topical Lidocaine 4% cream applied to wound bed prior to debridement (In Clinic Only). Wound #9 Left,Lateral,Posterior Lower Leg: Topical Lidocaine 4% cream applied to wound bed prior to debridement (In Clinic Only). Skin Barriers/Peri-Wound Care: Wound #5 Left,Medial Lower Leg: Triamcinolone Acetonide Ointment (TCA) Wound #6 Left,Lateral Lower Leg: Triamcinolone Acetonide Ointment (TCA) Wound #9 Left,Lateral,Posterior Lower Leg: Triamcinolone Acetonide Ointment (TCA) Primary Wound Dressing: Wound #5 Left,Medial Lower Leg: Mepitel One Contact layer Hydrafera Blue Ready Transfer Wound #6 Left,Lateral Lower Leg: Mepitel One Contact layer Hydrafera Blue Ready Transfer Wound #9 Left,Lateral,Posterior Lower Leg: Mepitel One Contact layer Hydrafera Blue Ready Transfer Secondary Dressing: Wound #5 Left,Medial Lower Leg: Drawtex Wound #6 Left,Lateral Lower Leg: Drawtex Wound #9 Left,Lateral,Posterior Lower Leg: Drawtex Dressing Change Frequency: Wound #5 Left,Medial Lower Leg: Change dressing every week Other: - as needed. Wound #6 Left,Lateral Lower Leg: Change dressing every week Other: - as needed. Wound #9 Left,Lateral,Posterior Lower Leg: Change dressing every week Other: - as needed. Follow-up Appointments: Wound #5 Left,Medial Lower Leg: Return Appointment in 1 week. Nurse Visit as needed - Call of needed Wound #6 Left,Lateral Lower Leg: Return Appointment in 1 week. Nurse Visit as needed - Call of needed Wound #9 Left,Lateral,Posterior Lower Leg: Return Appointment in 1 week. FARNAZ, DERMODY ([id number]) Nurse Visit as needed -  Call of needed Edema Control: Wound #5 Left,Medial Lower Leg: 3 Layer Compression System - Left Lower Extremity - unna to anchor Wound #6 Left,Lateral Lower Leg: 3 Layer Compression System - Left Lower Extremity - unna to anchor Wound #9 Left,Lateral,Posterior Lower Leg: 3 Layer Compression System - Left Lower Extremity - unna to anchor General Notes: [place] VOB 1. We put a contact layer of Mepitel under the Hydrofera Blue otherwise back in 3 layer compression Electronic Signature(s) Signed: 09/16/2019 5:41:49 PM By: Linton Ham  MD Entered By: Linton Ham on 09/16/2019 [place], [name] -------------------------------------------------------------------------------- SuperBill Details Patient Name: GISSEL, FERNHOLZ. Date of Service: 09/16/2019 Medical Record Number: [id number] Patient Account Number: [id number] Date of Birth/Sex: [birth year] (73 y.o. F) Treating RN: Cornell Barman Primary Care Provider: [name] Other Clinician: Referring Provider: [name] Treating Provider/Extender: Tito Dine in Treatment: 40 Diagnosis Coding ICD-10 Codes Code Description [phone] Non-pressure chronic ulcer of left calf limited to breakdown of skin I87.321 Chronic venous hypertension (idiopathic) with inflammation of right lower extremity I89.0 Lymphedema, not elsewhere classified Facility Procedures CPT4 Code Description: [id number] 11042 - DEB SUBQ TISSUE 20 SQ CM/< ICD-10 Diagnosis Description L97.221 Non-pressure chronic ulcer of left calf limited to breakdown I87.321 Chronic venous hypertension (idiopathic) with inflammation of I89.0 Lymphedema,  not elsewhere classified Modifier: of skin right lower ext Quantity: 1 remity Physician Procedures CPT4 Code Description: [id number] 11042 - WC PHYS SUBQ TISS 20 SQ CM ICD-10 Diagnosis Description L97.221 Non-pressure chronic ulcer of left calf limited to breakdown I87.321 Chronic venous hypertension  (idiopathic) with inflammation of I89.0 Lymphedema,  not elsewhere classified Modifier: of skin right lower extr Quantity: 1 emity Electronic Signature(s) Signed: 09/16/2019 5:41:49 PM By: Linton Ham MD Entered By: Linton Ham on 09/16/2019 17:37:32

## 2019-09-23 ENCOUNTER — Encounter: Payer: Medicare Other | Admitting: Internal Medicine

## 2019-09-23 ENCOUNTER — Other Ambulatory Visit: Payer: Self-pay

## 2019-09-23 DIAGNOSIS — L97222 Non-pressure chronic ulcer of left calf with fat layer exposed: Secondary | ICD-10-CM | POA: Diagnosis not present

## 2019-09-23 DIAGNOSIS — L03116 Cellulitis of left lower limb: Secondary | ICD-10-CM | POA: Diagnosis not present

## 2019-09-23 DIAGNOSIS — I890 Lymphedema, not elsewhere classified: Secondary | ICD-10-CM | POA: Diagnosis not present

## 2019-09-23 DIAGNOSIS — G4730 Sleep apnea, unspecified: Secondary | ICD-10-CM | POA: Diagnosis not present

## 2019-09-23 DIAGNOSIS — I739 Peripheral vascular disease, unspecified: Secondary | ICD-10-CM | POA: Diagnosis not present

## 2019-09-23 DIAGNOSIS — I10 Essential (primary) hypertension: Secondary | ICD-10-CM | POA: Diagnosis not present

## 2019-09-23 DIAGNOSIS — L97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d: Secondary | ICD-10-CM | POA: Diagnosis not present

## 2019-09-23 DIAGNOSIS — Z86718 Personal history of other venous thrombosis and embolism: Secondary | ICD-10-CM | POA: Diagnosis not present

## 2019-09-24 NOTE — Progress Notes (Signed)
[name], [name] ([id number]) Visit Report for 09/23/2019 Arrival Information Details Patient Name: [name], [name]. Date of Service: 09/23/2019 2:00 PM Medical Record Number: [id number] Patient Account Number: [id number] Date of Birth/Sex: 06-11-1946 (73 y.o. F) Treating RN: [name] Primary Care [name]: [name] Other Clinician: Referring [name]: [name] Treating [name]/Extender: [name] in Treatment: 58 Visit Information History Since Last Visit Added or deleted any medications: No Patient Arrived: Ambulatory Any new allergies or adverse reactions: No Arrival Time: 14:11 Had a fall or experienced change in No Accompanied By: self activities of daily living that may affect Transfer Assistance: None risk of falls: Patient Identification Verified: Yes Signs or symptoms of abuse/neglect since last visito No Secondary Verification Process Yes Hospitalized since last visit: No Completed: Implantable device outside of the clinic excluding No Patient Requires Transmission-Based No cellular tissue based products placed in the center Precautions: since last visit: Patient Has Alerts: Yes Has Dressing in Place as Prescribed: Yes Patient Alerts: Patient on Blood Pain Present Now: No Thinner aspirin 81 Electronic Signature(s) Signed: 09/23/2019 4:28:59 PM By: [name] RCP, RRT, CHT Entered By: [name] on 09/23/2019 14:11:56 [name], [name] -------------------------------------------------------------------------------- Encounter Discharge Information Details Patient Name: [name], [name]. Date of Service: 09/23/2019 2:00 PM Medical Record Number: [id number] Patient Account Number: [id number] Date of Birth/Sex: January 12, 1946 (73 y.o. F) Treating RN: [name] Primary Care Faiz Weber: [name] Other Clinician: Referring Sharisse Rantz: [name] Treating Sholonda Jobst/Extender: [name] in Treatment: 84 Encounter Discharge Information Items Discharge Condition: Stable Ambulatory Status: Ambulatory Discharge Destination: Home Transportation: Private Auto Accompanied By: self Schedule Follow-up Appointment: Yes Clinical Summary of Care: Electronic Signature(s) Signed: 09/24/2019 1:15:06 PM By: [name], [name], RN, CWS, [name], [name] Entered By: [name], [name], RN, CWS, [name] on 09/23/2019 14:54:34 [name] -------------------------------------------------------------------------------- Lower Extremity Assessment Details Patient Name: [name], [name]. Date of Service: 09/23/2019 2:00 PM Medical Record Number: [id number] Patient Account Number: [id number] Date of Birth/Sex: [birth year] (73 y.o. F) Treating RN: Army Melia Primary Care Berley Gambrell: [name] Other Clinician: Referring Lasaundra Riche: [name] Treating Jazzlin Clements/Extender: [name] in Treatment: 41 Edema Assessment Assessed: [Left: No] [Right: No] Edema: [Left: N] [Right: o] Calf Left: Right: Point of Measurement: 31 cm From Medial Instep 44 cm cm Ankle Left: Right: Point of Measurement: 12 cm From Medial Instep 24 cm cm Vascular Assessment Pulses: Dorsalis Pedis Palpable: [Left:Yes] Electronic Signature(s) Signed: 09/23/2019 4:09:40 PM By: Army Melia Entered By: Army Melia on 09/23/2019 14:35:00 [name], [name] -------------------------------------------------------------------------------- Multi Wound Chart Details Patient Name: [name], [name]. Date of Service: 09/23/2019 2:00 PM Medical Record Number: [id number] Patient Account Number: [id number] Date of Birth/Sex: 01-21-1946 (73 y.o. F) Treating RN: [name] Primary Care [name] [name]: [name] Other Clinician: Referring Erisa Mehlman: [name] Treating Randon Somera/Extender: [name] in Treatment: 41 Vital Signs Height(in): 63 Pulse(bpm):  64 Weight(lbs): 224.7 Blood Pressure(mmHg): 127/67 Body Mass Index(BMI): 40 Temperature(F): 98.9 Respiratory Rate 16 (breaths/min): Photos: Wound Location: Left Lower Leg - Medial Left Lower Leg - Lateral Left Lower Leg - Lateral, Posterior Wounding Event: Gradually Appeared Gradually Appeared Gradually Appeared Primary Etiology: Lymphedema Venous Leg Ulcer Venous Leg Ulcer Comorbid History: Cataracts, Asthma, Sleep Cataracts, Asthma, Sleep Cataracts, Asthma, Sleep Apnea, Deep Vein Apnea, Deep Vein Apnea, Deep Vein Thrombosis, Hypertension, Thrombosis, Hypertension, Thrombosis, Hypertension, Peripheral Venous Disease, Peripheral Venous Disease, Peripheral Venous Disease, Osteoarthritis, Received Osteoarthritis, Received Osteoarthritis, Received Chemotherapy, Received Chemotherapy, Received Chemotherapy, Received Radiation Radiation  Radiation Date Acquired: 11/19/2018 01/19/2019 07/08/2019 Weeks of Treatment: 41 35 11 Wound Status: Open Open Open Measurements L x W x D 2.8x3.5x0.2 5.5x3.1x0.2 1.5x1.5x0.1 (cm) Area (cm) : 7.697 13.391 1.767 Volume (cm) : 1.539 2.678 0.177 % Reduction in Area: 9.90% -5986.80% -212.70% % Reduction in Volume: -80.00% -12072.70% -210.50% Classification: Full Thickness Without Full Thickness Without Full Thickness Without Exposed Support Structures Exposed Support Structures Exposed Support Structures Exudate Amount: Large Large Large Exudate Type: Serosanguineous Serosanguineous Serosanguineous Exudate Color: red, brown red, brown red, brown Wound Margin: Flat and Intact Flat and Intact Flat and Intact Granulation Amount: Medium (34-66%) Small (1-33%) Small (1-33%) Granulation Quality: Pale Pink Pink Necrotic Amount: Medium (34-66%) Large (67-100%) Large (67-100%) [name], [name] ([id number]) Exposed Structures: Fat Layer (Subcutaneous Fat Layer (Subcutaneous Fat Layer (Subcutaneous Tissue) Exposed: Yes Tissue) Exposed: Yes Tissue) Exposed:  Yes Fascia: No Fascia: No Fascia: No Tendon: No Tendon: No Tendon: No Muscle: No Muscle: No Muscle: No Joint: No Joint: No Joint: No Bone: No Bone: No Bone: No Epithelialization: None None None Treatment Notes Wound #5 (Left, Medial Lower Leg) Notes TCA, SIlver Alginate, Drawtex, ABD, 3-Layer, unna to anchor Wound #6 (Left, Lateral Lower Leg) Notes TCA, SIlver Alginate, Drawtex, ABD, 3-Layer, unna to anchor Wound #9 (Left, Lateral, Posterior Lower Leg) Notes TCA, SIlver Alginate, Drawtex, ABD, 3-Layer, unna to anchor Electronic Signature(s) Signed: 09/23/2019 5:09:09 PM By: Linton Ham MD Entered By: Linton Ham on 09/23/2019 16:36:14 Shorten, [name] -------------------------------------------------------------------------------- Multi-Disciplinary Care Plan Details Patient Name: [name], [name]. Date of Service: 09/23/2019 2:00 PM Medical Record Number: [id number] Patient Account Number: [id number] Date of Birth/Sex: Dec 14, 1945 (73 y.o. F) Treating RN: [name] Primary Care Dasani Crear: [name] Other Clinician: Referring Richy Spradley: [name] Treating Ellysia Char/Extender: [name] in Treatment: 27 Active Inactive Orientation to the Wound Care Program Nursing Diagnoses: Knowledge deficit related to the wound healing center program Goals: Patient/caregiver will verbalize understanding of the [facility] Date Initiated: 12/10/2018 Target Resolution Date: 01/09/2019 Goal Status: Active Interventions: Provide education on orientation to the wound center Notes: Soft Tissue Infection Nursing Diagnoses: Impaired tissue integrity Goals: Patient's soft tissue infection will resolve Date Initiated: 12/10/2018 Target Resolution Date: 01/09/2019 Goal Status: Active Interventions: Assess signs and symptoms of infection every visit Notes: Venous Leg Ulcer Nursing Diagnoses: Actual venous Insuffiency (use after  diagnosis is confirmed) Goals: Patient will maintain optimal edema control Date Initiated: 12/10/2018 Target Resolution Date: 01/09/2019 Goal Status: Active Interventions: Assess peripheral edema status every visit. [name], [name] Treatment Activities: Therapeutic compression applied : 12/10/2018 Notes: Wound/Skin Impairment Nursing Diagnoses: Impaired tissue integrity Goals: Patient/caregiver will verbalize understanding of skin care regimen Date Initiated: 12/10/2018 Target Resolution Date: 01/09/2019 Goal Status: Active Interventions: Assess ulceration(s) every visit Treatment Activities: Topical wound management initiated : 12/10/2018 Notes: Electronic Signature(s) Signed: 09/24/2019 1:15:06 PM By: [name], [name], RN, CWS, [name], [name] Entered By: [name], [name], RN, CWS, [name] on 09/23/2019 14:48:28 Bhagat, [name] -------------------------------------------------------------------------------- Pain Assessment Details Patient Name: [name], [name]. Date of Service: 09/23/2019 2:00 PM Medical Record Number: [id number] Patient Account Number: [id number] Date of Birth/Sex: Mar 30, 1946 (73 y.o. F) Treating RN: [name] Primary Care Florentine Diekman: [name] Other Clinician: Referring Lanier Millon: [name] Treating Brenlynn Fake/Extender: [name] in Treatment: 41 Active Problems Location of Pain Severity and Description of Pain Patient Has Paino No Site Locations Pain Management and Medication Current Pain Management: Electronic Signature(s) Signed: 09/23/2019 4:28:59 PM By: [name] RCP, RRT,  CHT Signed: 09/24/2019 1:15:06 PM By: [name], [name], RN, CWS, [name], [name] Entered By: [name] on 09/23/2019 14:12:03 [name], [name] -------------------------------------------------------------------------------- Patient/Caregiver Education Details Patient Name: [name], [name]. Date of Service:  09/23/2019 2:00 PM Medical Record Number: [id number] Patient Account Number: [id number] Date of Birth/Gender: [birth year] (73 y.o. F) Treating RN: [name] Primary Care Physician: [name] Other Clinician: Referring Physician: Ria [name] Treating Physician/Extender: [name] in Treatment: 6 Education Assessment Education Provided To: Patient Education Topics Provided Wound/Skin Impairment: Handouts: Caring for Your Ulcer Methods: Demonstration, Explain/Verbal Responses: State content correctly Electronic Signature(s) Signed: 09/24/2019 1:15:06 PM By: [name], [name], RN, CWS, [name], [name] Entered By: [name], [name], RN, CWS, [name] on 09/23/2019 14:53:04 [name] -------------------------------------------------------------------------------- Wound Assessment Details Patient Name: [name], [name]. Date of Service: 09/23/2019 2:00 PM Medical Record Number: [id number] Patient Account Number: [id number] Date of Birth/Sex: 12-14-1945 (73 y.o. F) Treating RN: Army Melia Primary Care Drue Camera: [name] Other Clinician: Referring Aryan Bello: [name] Treating [name] [name]/Extender: [name] in Treatment: 41 Wound Status Wound Number: 5 Primary Lymphedema Etiology: Wound Location: Left Lower Leg - Medial Wound Open Wounding Event: Gradually Appeared Status: Date Acquired: 11/19/2018 Comorbid Cataracts, Asthma, Sleep Apnea, Deep Vein Weeks Of Treatment: 41 History: Thrombosis, Hypertension, Peripheral Venous Clustered Wound: No Disease, Osteoarthritis, Received Chemotherapy, Received Radiation Photos Wound Measurements Length: (cm) 2.8 Width: (cm) 3.5 Depth: (cm) 0.2 Area: (cm) 7.697 Volume: (cm) 1.539 % Reduction in Area: 9.9% % Reduction in Volume: -80% Epithelialization: None Tunneling: No Undermining: No Wound Description Full Thickness Without Exposed Support Classification: Structures Wound  Margin: Flat and Intact Exudate Large Amount: Exudate Type: Serosanguineous Exudate Color: red, brown Foul Odor After Cleansing: No Slough/Fibrino Yes Wound Bed Granulation Amount: Medium (34-66%) Exposed Structure Granulation Quality: Pale Fascia Exposed: No Necrotic Amount: Medium (34-66%) Fat Layer (Subcutaneous Tissue) Exposed: Yes Necrotic Quality: Adherent Slough Tendon Exposed: No Muscle Exposed: No Joint Exposed: No Bone Exposed: No Mcfarland, Luzelena J. ([id number]) Treatment Notes Wound #5 (Left, Medial Lower Leg) Notes TCA, SIlver Alginate, Drawtex, ABD, 3-Layer, unna to anchor Electronic Signature(s) Signed: 09/23/2019 4:09:40 PM By: Army Melia Entered By: Army Melia on 09/23/2019 14:33:10 Rouillard, [name] -------------------------------------------------------------------------------- Wound Assessment Details Patient Name: [name], [name]. Date of Service: 09/23/2019 2:00 PM Medical Record Number: [id number] Patient Account Number: [id number] Date of Birth/Sex: Mar 11, 1946 (73 y.o. F) Treating RN: Army Melia Primary Care Massey Ruhland: [name] Other Clinician: Referring Edna Rede: [name] Treating Benjimin Hadden/Extender: [name] in Treatment: 41 Wound Status Wound Number: 6 Primary Venous Leg Ulcer Etiology: Wound Location: Left Lower Leg - Lateral Wound Open Wounding Event: Gradually Appeared Status: Date Acquired: 01/19/2019 Comorbid Cataracts, Asthma, Sleep Apnea, Deep Vein Weeks Of Treatment: 35 History: Thrombosis, Hypertension, Peripheral Venous Clustered Wound: No Disease, Osteoarthritis, Received Chemotherapy, Received Radiation Photos Wound Measurements Length: (cm) 5.5 Width: (cm) 3.1 Depth: (cm) 0.2 Area: (cm) 13.391 Volume: (cm) 2.678 % Reduction in Area: -5986.8% % Reduction in Volume: -12072.7% Epithelialization: None Tunneling: No Undermining: No Wound Description Full Thickness Without  Exposed Support Classification: Structures Wound Margin: Flat and Intact Exudate Large Amount: Exudate Type: Serosanguineous Exudate Color: red, brown Foul Odor After Cleansing: No Slough/Fibrino Yes Wound Bed Granulation Amount: Small (1-33%) Exposed Structure Granulation Quality: Pink Fascia Exposed: No Necrotic Amount: Large (67-100%) Fat Layer (Subcutaneous Tissue) Exposed: Yes Necrotic Quality: Adherent Slough Tendon Exposed: No Muscle Exposed: No Joint Exposed: No Bone Exposed: No Vohs, Marillyn J. ([id number]) Treatment  Notes Wound #6 (Left, Lateral Lower Leg) Notes TCA, SIlver Alginate, Drawtex, ABD, 3-Layer, unna to anchor Electronic Signature(s) Signed: 09/23/2019 4:09:40 PM By: Army Melia Entered By: Army Melia on 09/23/2019 14:33:50 Mannina, [name] ([id number]) -------------------------------------------------------------------------------- Wound Assessment Details Patient Name: [name], [name]. Date of Service: 09/23/2019 2:00 PM Medical Record Number: [id number] Patient Account Number: [id number] Date of Birth/Sex: [birth year] (73 y.o. F) Treating RN: Army Melia Primary Care Audry Kauzlarich: [name] Other Clinician: Referring Alisson Rozell: [name] Treating Denell Cothern/Extender: [name] in Treatment: 41 Wound Status Wound Number: 9 Primary Venous Leg Ulcer Etiology: Wound Location: Left Lower Leg - Lateral, Posterior Wound Open Wounding Event: Gradually Appeared Status: Date Acquired: 07/08/2019 Comorbid Cataracts, Asthma, Sleep Apnea, Deep Vein Weeks Of Treatment: 11 History: Thrombosis, Hypertension, Peripheral Venous Clustered Wound: No Disease, Osteoarthritis, Received Chemotherapy, Received Radiation Photos Wound Measurements Length: (cm) 1.5 Width: (cm) 1.5 Depth: (cm) 0.1 Area: (cm) 1.767 Volume: (cm) 0.177 % Reduction in Area: -212.7% % Reduction in Volume: -210.5% Epithelialization: None Tunneling:  No Undermining: No Wound Description Full Thickness Without Exposed Support Classification: Structures Wound Margin: Flat and Intact Exudate Large Amount: Exudate Type: Serosanguineous Exudate Color: red, brown Foul Odor After Cleansing: No Slough/Fibrino Yes Wound Bed Granulation Amount: Small (1-33%) Exposed Structure Granulation Quality: Pink Fascia Exposed: No Necrotic Amount: Large (67-100%) Fat Layer (Subcutaneous Tissue) Exposed: Yes Necrotic Quality: Adherent Slough Tendon Exposed: No Muscle Exposed: No Joint Exposed: No Bone Exposed: No Shreeve, Anna-Marie J. ([id number]) Treatment Notes Wound #9 (Left, Lateral, Posterior Lower Leg) Notes TCA, SIlver Alginate, Drawtex, ABD, 3-Layer, unna to anchor Electronic Signature(s) Signed: 09/23/2019 4:09:40 PM By: Army Melia Entered By: Army Melia on 09/23/2019 14:34:37 Demarest, [name] ([id number]) -------------------------------------------------------------------------------- Vitals Details Patient Name: TIKISHA, BENEDUM. Date of Service: 09/23/2019 2:00 PM Medical Record Number: [id number] Patient Account Number: [id number] Date of Birth/Sex: 04/02/1946 (73 y.o. F) Treating RN: [name] Primary Care Suzi Hernan: [name] Other Clinician: Referring Mackinley Kiehn: [name] Treating Neyland Pettengill/Extender: [name] in Treatment: 41 Vital Signs Time Taken: 14:12 Temperature (F): 98.9 Height (in): 63 Pulse (bpm): 88 Weight (lbs): 224.7 Respiratory Rate (breaths/min): 16 Body Mass Index (BMI): 39.8 Blood Pressure (mmHg): 127/67 Reference Range: 80 - 120 mg / dl Electronic Signature(s) Signed: 09/23/2019 4:28:59 PM By: [name] RCP, RRT, CHT Entered By: [name] on 09/23/2019 14:15:23

## 2019-09-24 NOTE — Progress Notes (Signed)
[name], [name] Visit Report for 09/23/2019 HPI Details Patient Name: [name], [name]. Date of Service: 09/23/2019 2:00 PM Medical Record Number: [id number] Patient Account Number: [id number] Date of Birth/Sex: [birth year] (73 y.o. F) Treating RN: Cornell Barman Primary Care Provider: [name] Other Clinician: Referring Provider: [name] Treating Provider/Extender: Tito Dine in Treatment: 58 History of Present Illness HPI Description: Pleasant 73 year old with history of chronic venous insufficiency. No diabetes or peripheral vascular disease. Left ABI 1.29. Questionable history of left lower extremity DVT. She developed a recurrent ulceration on her left lateral calf in December 2015, which she attributes to poor diet and subsequent lower extremity edema. She underwent endovenous laser ablation of her left greater saphenous vein in 2010. She underwent laser ablation of accessory branch of left GSV in April 2016 by Dr. Kellie Simmering at [facility]. She was previously wearing Unna boots, which she tolerated well. Tolerating 2 layer compression and cadexomer iodine. She returns to clinic for follow-up and is without new complaints. She denies any significant pain at this time. She reports persistent pain with pressure. No claudication or ischemic rest pain. No fever or chills. No drainage. READMISSION 11/13/16; this is a 73 year old woman who is not a diabetic. She is here for a review of a painful area on her left medial lower extremity. I note that she was seen here previously last year for wound I believe to be in the same area. At that time she had undergone previously a left greater saphenous vein ablation by Dr. Kellie Simmering and she had a ablation of the anterior accessory branch of the left greater saphenous vein in March 2016. Seeing that the wound actually closed over. In reviewing the history with her today the ulcer in this area has been recurrent. She  describes a biopsy of this area in 2009 that only showed stasis physiology. 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 Do not see that she has ever had arterial studies in the left leg. She tells me she has a follow-up with Dr. Kellie Simmering in roughly 10 days In any case she developed the reopening of this area roughly a month ago. On the background of this she describes rapidly increasing edema which has responded to Lasix 40 mg and metolazone 2.5 mg as well as the patient's lymph massage. She has been told she has both venous insufficiency and lymphedema but she cannot tolerate compression stockings 11/28/16; the patient saw Dr. Kellie Simmering recently. Per the patient he did arterial Dopplers in the office that did not show evidence of arterial insufficiency, per the patient he stated "treat this like an ordinary venous ulcer". She also saw her dermatologist Dr. Ronnald Ramp who felt that this was more of a vascular ulcer. In general things are improving although she arrives today with increasing bilateral lower extremity edema with weeping a deeper fluid through the wound on the left medial leg compatible with some degree of lymphedema 12/04/16; the patient's wound is fully epithelialized but I don't think fully healed. We will do another week of depression with Promogran and TCA however I suspect we'll be able to discharge her next week. This is a very unusual-looking wound which was initially a figure-of-eight type wound lying on its side surrounded by petechial like hemorrhage. She  has had venous ablation on this side. She apparently does not have an arterial issue per Dr. Kellie Simmering. She saw her dermatologist thought it was "vascular". Patient is definitely going to need ongoing compression and I  talked about this with her today she will go to elastic therapy after she leaves here next week 12/11/16; the patient's wound is not completely closed today. She has surrounding scar tissue and in further discussion with the patient it would appear that she had ulcers in this area in 2009 for a prolonged period of time ultimately requiring a punch biopsy of this area that only showed venous insufficiency. I did not previously pickup on this part of the history from the patient. 12/18/16; the patient's wound is completely epithelialized. There is no open area here. She has significant bilateral venous insufficiency with secondary lymphedema to a mild-to-moderate degree she does not have compression stockings.. She did not say anything to me when I was in the room, she told our intake nurse that she was still having pain in this area. This isn't [name], [name] unusual recurrent small open area. She is going to go to elastic therapy to obtain compression stockings. 12/25/16; the patient's wound is fully epithelialized. There is no open area here. The patient describes some continued episodic discomfort in this area medial left calf. However everything looks fine and healed here. She is been to elastic therapy and caught herself 15-20 mmHg stockings, they apparently were having trouble getting 20-30 mm stockings in her size 01/22/17; this is a patient we discharged from the clinic a month ago. She has a recurrent open wound on her medial left calf. She had 15 mm support stockings. I told her I thought she needed 20-30 mm compression stockings. She tells me that she has been ill with hospitalization secondary to asthma and is been found to have severe hypokalemia likely secondary to a combination of Lasix and metolazone. This morning she noted blistering and leaking fluid on the posterior part of her left leg. She called our intake nurse urgently and we was saw her this afternoon. She has not  had any real discomfort here. I don't know that she's been wearing any stockings on this leg for at least 2-3 days. ABIs in this clinic were 1.21 on the right and 1.3 on the left. She is previously seen vascular surgery who does not think that there is a peripheral arterial issue. 01/30/17; Patient arrives with no open wound on the left leg. She has been to elastic therapy and obtained 20-75mmhg below knee stockings and she has one on the right leg today. READMISSION 02/19/18; this Forgacs is a now 73 year old patient we've had in this clinic perhaps 3 times before. I had last looked at her from January 07 December 2016 with an area on the medial left leg. We discharged her on 12/25/16 however she had to be readmitted on 01/22/17 with a recurrence. I have in my notes that we discharged her on 20-30 mm stockings although she tells me she was only wearing support hose because she cannot get stockings on predominantly related to her cervical spine surgery/issues. She has had previous ablations done by vein and vascular in [place] including a great saphenous vein ablation on the left with an anterior accessory branch ablation I think both of these were in 2016. On one of the previous visit she had a biopsy noted 2009 that was negative. She is not felt to have an arterial issue. She is not  a diabetic. She does have a history of obstructive sleep apnea hypertension asthma as well as chronic venous insufficiency and lymphedema. On this occasion she noted 2 dry scaly patch on her left leg. She tried to put lotion on this it didn't really help. There were 2 open areas.the patient has been seeing her primary physician from 02/05/18 through 02/14/18. She had Unna boots applied. The superior wound now on the lateral left leg has closed but she's had one wound that remains open on the lateral left leg. This is not the same spot as we dealt with in 2018. ABIs in this clinic were 1.3 bilaterally 02/26/18; patient has a  small wound on the left lateral calf. Dimensions are down. She has chronic venous insufficiency and lymphedema. 03/05/18; small open area on the left lateral calf. Dimensions are down. Tightly adherent necrotic debris over the surface of the wound which was difficult to remove. Also the dressing [over collagen] stuck to the wound surface. This was removed with some difficulty as well. Change the primary dressing to Hydrofera Blue ready 03/12/18; small open area on the left lateral calf. Comes in with tightly adherent surface eschar as well as some adherent Hydrofera Blue. 03/19/18; open area on the left lateral calf. Again adherent surface eschar as well as some adherent Hydrofera Blue nonviable subcutaneous tissue. She complained of pain all week even with the reduction from 4-3 layer compression I put on last week. Also she had an increase in her ankle and calf measurements probably related to the same thing. 03/26/18; open area on the left lateral calf. A very small open area remains here. We used silver alginate starting last week as the Hydrofera Blue seem to stick to the wound bed. In using 4-layer compression 04/02/18; the open area in the left lateral calf at some adherent slough which I removed there is no open area here. We are able to transition her into her own compression stocking. Truthfully I think this is probably his support hose. However this does not maintain skin integrity will be limited. She cannot put over the toe compression stockings on because of neck problems hand problems etc. She is allergic to the lining layer of juxta lites. We might be forced to use extremitease stocking should this fail READMIT 11/24/2018 Patient is now a 73 year old woman who is not a diabetic. She has been in this clinic on at least 3 previous occasions largely with recurrent wounds on her left leg secondary to chronic venous insufficiency with secondary lymphedema. Her situation is complicated by  inability to get stockings on and an allergy to neoprene which is apparently a component and at least juxta lites and other stockings. As a result she really has not been wearing any stockings on her legs. She tells [place] that roughly 2 or 3 weeks ago she started noticing a stinging sensation just above her ankle on the left medial aspect. She has been diagnosed with pseudogout and she wondered whether this was what she was experiencing. She tried to dress this with something she bought at the store however subsequently it pulled skin off and now she has an open wound that is not improving. She has been using Vaseline gauze with a cover bandage. She saw her primary doctor last week who [name], [name] put an Unna boot on her. ABIs in this clinic was 1.03 on the left 2/12; the area is on the left medial ankle. Odd-looking wound with what looks to be surface epithelialization  but a multitude of small petechial openings. This clearly not closed yet. We have been using silver alginate under 3 layer compression with TCA 2/19; the wound area did not look quite as good this week. Necrotic debris over the majority of the wound surface which required debridement. She continues to have a multitude of what looked to be small petechial openings. She reminds [place] that she had a biopsy on this initially during her first outbreak in 2015 in [place] [facility]. She expresses concern about this being a possible melanoma. 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 I assured her that this does not look anything like melanoma. Except for the petechial reaction it does look like a venous insufficiency area and she certainly has evidence of this on both sides 2/26; a difficult area on the left medial ankle. The patient clearly has chronic venous hypertension with some degree of lymphedema. The odd thing about the area is  the small petechial hemorrhages. I am not really sure how to explain this. This was present last time and this is not a compression injury. We have been using Hydrofera Blue which I changed to last week 3/4; still using Hydrofera Blue. Aggressive debridement today. She does not have known arterial issues. She has seen Dr. Kellie Simmering at [facility] vein and vascular and and has an ablation on the left. [Anterior accessory branch of the greater saphenous]. From what I remember they did not feel she had an arterial issue. The patient has had this area biopsied in 2009 at [facility] dermatology and by her recollection they said this was "stasis". She is also follow-up with dermatology locally who thought that this was more of a vascular issue 3/11; using Hydrofera Blue. Aggressive debridement today. She does not have an arterial issue. We are using 3 layer compression although we may need to go to 4. The patient has been in for multiple changes to her wrap since I last saw her a week ago. She says that the area was leaking. I do not have too much more information on what was found 01/19/19 on evaluation today patient was actually being seen for a nurse visit when unfortunately she had the area on her left lateral lower extremity as well as weeping from the right lower extremity that became apparent. Therefore we did end up actually seeing her for a full visit with myself. She is having some pain at this site as well but fortunately nothing too significant at this point. No fevers, chills, nausea, or vomiting noted at this time. 3/18-Patient is back to the clinic with the left leg venous leg ulcer, the ulcer is larger in size, has a surface that is densely adherent with fibrinous tissue, the Hydrofera Blue was used but is densely adherent and there was difficulty in removing it. The right lower extremity was also wrapped for weeping edema. Patient has a new area over the left lateral foot above the malleolus  that is small and appears to have no debris with intact surrounding skin. Patient is on increased dose of Lasix also as a means to edema management 3/25; the patient has a nonhealing venous ulcer on the medial left leg and last week developed a smaller area on the lateral left calf. We have been using Hydrofera Blue with a contact layer. 4/1; no major change in these wounds areas. Left medial and more recently left lateral calf. I tried Iodoflex last week  to aid in debridement she did not tolerate this. She stated her pain was terrible all week. She took the top layer of the 4 layer compression off. 4/8; the patient actually looks somewhat better in terms of her more prominent left lateral calf wound. There is some healthy looking tissue here. She is still complaining of a lot of discomfort. 4/15; patient in a lot of pain secondary to sciatica. She is on a prednisone taper prescribed by her primary physician. She has the 2 areas one on the left medial and more recently a smaller area on the left lateral calf. Both of these just above the malleoli 4/22; her back pain is better but she still states she is very uncomfortable and now feels she is intolerant to the [employer]. No real change in the wounds we have been using Sorbact. She has been previously intolerant to Iodoflex. There is not a lot of option about what we can use to debride this wound under compression that she no doubt needs. sHe states Ultram no longer works for her pain 4/29; no major change in the wounds slightly increased depth. Surface on the original medial wound perhaps somewhat improved however the more recent area on the lateral left ankle is 100% covered in very adherent debris we have been using Sorbact. She tolerates 4 layer compression well and her edema control is a lot better. She has not had to come in for a nurse check 5/6; no major change in the condition of the wounds. She did consent to debridement today which was  done with some difficulty. Continuing Sorbact. She did not tolerate Iodoflex. She was in for a check of her compression the day after we wrapped her last week this was adjusted but nothing much was found 5/13; no major change in the condition or area of the wounds. I was able to get a fairly aggressive debridement done on the lateral left leg wound. Even using Sorbact under compression. She came back in on Friday to have the wrap changed. She says she felt uncomfortable on the lateral aspect of her ankle. She has a long history of chronic venous insufficiency including previous ablation surgery on this side. 5/20-Patient returns for wounds on left leg with both wounds covered in slough, with the lateral leg wound larger in size, she has been in 3 layer compression and felt more comfortable, she describes pain in ankle, in leg and pins and needles in foot, Pereida, Saarah J. ([id number]) and is about to try Pamelor for this 6/3; wounds on the left lateral and left medial leg. The area medially which is the most recent of the 2 seems to have had the largest increase in dimensions. We have been using Sorbac to try and debride the surface. She has been to see orthopedics they apparently did a plain x-ray that was indeterminant. Diagnosed her with neuropathy and they have ordered an MRI to determine if there is underlying osteomyelitis. This was not high on my thought list but I suppose it is prudent. We have advised her to make an appointment with vein and vascular in [place]. She has a history of a left greater saphenous and accessory vein ablations I wonder if there is anything else that can be done from a surgical point of view to help in these difficult refractory wounds. We have previously healed this wound on one occasion but it keeps on reopening [medial side] 6/10; deep tissue culture I did last week I think on the  left medial wound showed both moderate E. coli and moderate staph aureus  [MSSA]. She is going to require antibiotics and I have chosen Augmentin. We have been using Sorbact and we have made better looking wound surface on both sides but certainly no improvement in wound area. She was back in last Friday apparently for a dressing changes the wrap was hurting her outer left ankle. She has not managed to get a hold of vein and vascular in [place]. We are going to have to make her that appointment 6/17; patient is tolerating the Augmentin. She had an MRI that I think was ordered by orthopedic surgeon this did not show osteomyelitis or an abscess did suggest cellulitis. We have been using Sorbact to the lateral and medial ankles. We have been trying to arrange a follow-up appointment with vein and vascular in [place] or did her original ablations. We apparently an area sent the request to vein and vascular in [facility] 6/24; patient has completed the Augmentin. We do not yet have a vein and vascular appointment in [place]. I am not sure what the issue is here we have asked her to call tomorrow. We are using Sorbact. Making some improvements and especially the medial wound. Both surfaces however look better medial and lateral. 7/1; the patient has been in contact with vein and vascular in [place] but has not yet received an appointment. Using Sorbact we have gradually improve the wound surface with no improvement in surface area. She is approved for Apligraf but the wound surface still is not completely viable. She has not had to come in for a dressing change 7/8; the patient has an appointment with vein and vascular on 7/31 which is a Friday afternoon. She is concerned about getting back here for [place] to dress her wounds. I think it is important to have them goal for her venous reflux/history of ablations etc. to see if anything else can be done. She apparently tested positive for 1 of the blood tests with regards to lupus and saw a rheumatologist. He has raised  the issue of vasculitis again. I have had this thought in the past however the evidence seems overwhelming that this is a venous reflux etiology. If the rheumatologist tells me there is clinical and laboratory investigation is positive for lupus I will rethink this. 7/15; the patient's wound surfaces are quite a bit better. The medial area which was her original wound now has no depth although the lateral wound which was the more recent area actually appears larger. Both with viable surfaces which is indeed better. Using Sorbact. I wanted to use Apligraf on her however there is the issue of the vein and vascular appointment on 7/31 at 2:00 in the afternoon which would not allow her to get back to be rewrapped and they would no doubt remove the graft 7/22; the patient's wound surfaces have moderate amount of debris although generally look better. The lateral one is larger with 2 small satellite areas superiorly. We are waiting for her vein and vascular appointment on 7/31. She has been approved for Apligraf which I would like to use after th 7/29; wound surfaces have improved no debridement is required we have been using Sorbact. She sees vein and vascular on Friday with this so question of whether anything can be done to lessen the likelihood of recurrence and/or speed the healing of these areas. She is already had previous ablations. She no doubt has severe venous hypertension 8/5-Patient returns at 1 week, she  was in [employer] for 3 days by her podiatrist, we have been using so backed to the wound, she has increased pain in both the wounds on the left lower leg especially the more distal one on the lateral aspect 8/12-Patient returns at 1 week and she is agreeable to having debridement in both wounds on her left leg today. We have been using Sorbact, and vascular studies were reviewed at last visit 8/19; the patient arrives with her wounds fairly clean and no debridement is required. We have used  Sorbact which is really done a nice job in cleaning up these very difficult wound surfaces. The patient saw Dr. Donzetta Matters of vascular surgery on 7/31. He did not feel that there was an arterial component. He felt that her treated greater saphenous vein is adequately addressed and that the small saphenous vein did not appear to be involved significantly. She was also noted to have deep venous reflux which is not treatable. Dr. Donzetta Matters mentioned the possibility of a central obstructive component leading to reflux and he offered her central venography. She wanted to discuss this or think about it. I have urged her to go ahead with this. She has had recurrent difficult wounds in these areas which do heal but after months in the clinic. If there is anything that can be done to reduce the likelihood of this I think it is worth it. 9/2 she is still working towards getting follow-up with Dr. Donzetta Matters to schedule her CT. Things are quite a bit worse venography. I put Apligraf on 2 weeks ago on both wounds on the medial and lateral part of her left lower leg. She arrives in clinic today with 3 superficial additional wounds above the area laterally and one below the wound medially. She describes a lot of discomfort. I think these are probably wrapped injuries. Does not look like she has cellulitis. 07/20/2019 on evaluation today patient appears to be doing somewhat poorly in regard to her lower extremity ulcers. She in fact showed signs of erythema in fact we may even be dealing with an infection at this time. Unfortunately I am unsure if this [name], VILLAROSA ([id number]) is just infection or if indeed there may be some allergic reaction that occurred as a result of the Apligraf application. With that being said that would be unusual but nonetheless not impossible in this patient is one who is unfortunately allergic to quite a bit. Currently we have been using the Sorbact which seems to do as well as anything for her. I do  think we may want to obtain a culture today to see if there is anything showing up there that may need to be addressed. 9/16; noted that last week the wounds look worse in 1 week follow-up of the Apligraf. Using Sorbact as of 2 days ago. She arrives with copious amounts of drainage and new skin breakdown on the back of the left calf. The wounds arm more substantial bilaterally. There is a fair amount of swelling in the left calf no overt DVT there is edema present I think in the left greater than right thigh. She is supposed to go on 9/28 for CT venography. The wounds on the medial and lateral calf are worse and she has new skin breakdown posteriorly at least new for me. This is almost developing into a circumferential wound area The Apligraf was taken off last week which I agree with things are not going in the right direction a culture was done we  do not have that back yet. She is on Augmentin that she started 2 days ago 9/23; dressing was changed by her nurses on Monday. In general there is no improvement in the wound areas although the area looks less angry than last week. She did get Augmentin for MSSA cultured on the 14th. She still appears to have too much swelling in the left leg even with 3 layer compression 9/30; the patient underwent her procedure on 9/28 by Dr. Donzetta [facility]. She was discovered to have the common iliac vein measuring 12.2 mm but at the level of L4-L5 measured 3 mm. After stenting it measured 10 mm. 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 Rouleaux flow in the common femoral and femoral vein was observed much improved after stenting. We are using silver alginate to the wounds on the medial and lateral ankle on the left. 4 layer compression 10/7; the patient had fluid swelling around her knee and 4 layer compression. At the advice of vein and vascular this was reduced to 3 layer which she is tolerating better. We have been using  silver alginate under 3 layer compression since last Friday 10/14; arrives with the areas on the left ankle looking a lot better. Inflammation in the area also a lot better. She came in for a nurse check on 10/9 10/21; continued nice improvement. Slight improvements in surface area of both the medial and lateral wounds on the left. A lot of the satellite lesions in the weeping erythema around these from stasis dermatitis is resolved. We have been using silver alginate 10/28; general improvement in the entire wound areas although not a lot of change in dimensions the wound certainly looks better. There is a lot less in terms of venous inflammation. Continue silver alginate this week however look towards Hydrofera Blue next week 11/4; very adherent debris on the medial wound left wound is not as bad. We have been using silver alginate. Change to [facility] today 11/11; very adherent debris on both wound areas. She went to vein and vascular last week and follow-up they put in [place] boot on this today. He says the [facility] was adherent. Wound is definitely not as good as last week. Especially on the left there the satellite lesions look more prominent 11/18; absolutely no better. erythema on lateral aspect with tenderness. Electronic Signature(s) Signed: 09/23/2019 5:09:09 PM By: Linton Ham MD Entered By: Linton Ham on 09/23/2019 16:40:28 Frayre, [name] -------------------------------------------------------------------------------- Physical Exam Details Patient Name: LAQUAISHA, PORTUONDO. Date of Service: 09/23/2019 2:00 PM Medical Record Number: [id number] Patient Account Number: [id number] Date of Birth/Sex: 1946-06-25 (73 y.o. F) Treating RN: Cornell Barman Primary Care Provider: [name] Other Clinician: Referring Provider: [name] Treating Provider/Extender: Tito Dine in Treatment: 41 Constitutional Sitting or standing Blood  Pressure is within target range for patient.. Pulse regular and within target range for patient.Marland Kitchen Respirations regular, non-labored and within target range.. Temperature is normal and within the target range for the patient.Marland Kitchen appears in no distress. Respiratory Respiratory effort is easy and symmetric bilaterally. Rate is normal at rest and on room air.. Cardiovascular Pedal pulses palpable and strong bilaterally.. Edema control is quite good. Integumentary (Hair, Skin) I am concerned about the erythema on the left lateral calf. Psychiatric No evidence of depression, anxiety, or agitation. Calm, cooperative, and communicative. Appropriate interactions and affect.. Notes wound exam; no debridement. erythema on the left lateral wound area with tenderness. Still satellite  lesions. Electronic Signature(s) Signed: 09/23/2019 5:09:09 PM By: Linton Ham MD Entered By: Linton Ham on 09/23/2019 16:44:23 [name], [name] -------------------------------------------------------------------------------- Physician Orders Details Patient Name: ROSALIND, KOLLIN. Date of Service: 09/23/2019 2:00 PM Medical Record Number: [id number] Patient Account Number: [id number] Date of Birth/Sex: Jun 12, 1946 (73 y.o. F) Treating RN: Cornell Barman Primary Care Provider: [name] Other Clinician: Referring Provider: [name] Treating Provider/Extender: Tito Dine in Treatment: 36 Verbal / Phone Orders: No Diagnosis Coding Wound Cleansing Wound #5 Left,Medial Lower Leg o Cleanse wound with mild soap and water Wound #6 Left,Lateral Lower Leg o Cleanse wound with mild soap and water Wound #9 Left,Lateral,Posterior Lower Leg o Cleanse wound with mild soap and water Anesthetic (add to Medication List) Wound #5 Left,Medial Lower Leg o Topical Lidocaine 4% cream applied to wound bed prior to debridement (In Clinic Only). Wound #6 Left,Lateral Lower Leg o  Topical Lidocaine 4% cream applied to wound bed prior to debridement (In Clinic Only). Wound #9 Left,Lateral,Posterior Lower Leg o Topical Lidocaine 4% cream applied to wound bed prior to debridement (In Clinic Only). Skin Barriers/Peri-Wound Care Wound #5 Left,Medial Lower Leg o Triamcinolone Acetonide Ointment (TCA) Wound #6 Left,Lateral Lower Leg o Triamcinolone Acetonide Ointment (TCA) Wound #9 Left,Lateral,Posterior Lower Leg o Triamcinolone Acetonide Ointment (TCA) Primary Wound Dressing Wound #5 Left,Medial Lower Leg o Silver Alginate Wound #6 Left,Lateral Lower Leg o Silver Alginate Wound #9 Left,Lateral,Posterior Lower Leg o Silver Alginate Secondary Dressing Wound #5 Left,Medial Lower Leg o Drawtex JD, WALTZ. ([id number]) Wound #6 Left,Lateral Lower Leg o Drawtex Wound #9 Left,Lateral,Posterior Lower Leg o Drawtex Dressing Change Frequency Wound #5 Left,Medial Lower Leg o Change dressing every week o Other: - as needed. Wound #6 Left,Lateral Lower Leg o Change dressing every week o Other: - as needed. Wound #9 Left,Lateral,Posterior Lower Leg o Change dressing every week o Other: - as needed. Follow-up Appointments Wound #5 Left,Medial Lower Leg o Return Appointment in 1 week. o Nurse Visit as needed - Call of needed Wound #6 Left,Lateral Lower Leg o Return Appointment in 1 week. o Nurse Visit as needed - Call of needed Wound #9 Left,Lateral,Posterior Lower Leg o Return Appointment in 1 week. o Nurse Visit as needed - Call of needed Edema Control Wound #5 Left,Medial Lower Leg o 3 Layer Compression System - Left Lower Extremity - unna to anchor Wound #6 Left,Lateral Lower Leg o 3 Layer Compression System - Left Lower Extremity - unna to anchor Wound #9 Left,Lateral,Posterior Lower Leg o 3 Layer Compression System - Left Lower Extremity - unna to anchor Medications-please add to medication list. Wound  #5 Left,Medial Lower Leg o P.O. Antibiotics Wound #6 Left,Lateral Lower Leg o P.O. Antibiotics Wound #9 Left,Lateral,Posterior Lower Leg o P.O. Antibiotics Patient Medications NYJAE, CULLIVER ([id number]) Allergies: Sulfa (Sulfonamide Antibiotics), latex, Neoprene Notifications Medication Indication Start End doxycycline monohydrate celluitis left leg 09/23/2019 DOSE oral 100 mg capsule - 1 capsule oral bid for 7 days Electronic Signature(s) Signed: 09/23/2019 4:46:39 PM By: Linton Ham MD Entered By: Linton Ham on 09/23/2019 16:46:37 Mierzejewski, [name] -------------------------------------------------------------------------------- Problem List Details Patient Name: ALLECIA, KOESTERS. Date of Service: 09/23/2019 2:00 PM Medical Record Number: [id number] Patient Account Number: [id number] Date of Birth/Sex: [birth year] (73 y.o. F) Treating RN: Cornell Barman Primary Care Provider: [name] Other Clinician: Referring Provider: [name] Treating Provider/Extender: Tito Dine in Treatment: 1 Active Problems ICD-10 Evaluated Encounter Code Description Active Date Today Diagnosis L97.221 Non-pressure chronic ulcer of  left calf limited to breakdown of 01/07/2019 No Yes skin I87.321 Chronic venous hypertension (idiopathic) with inflammation of 12/10/2018 No Yes right lower extremity I89.0 Lymphedema, not elsewhere classified 12/10/2018 No Yes Inactive Problems Resolved Problems Electronic Signature(s) Signed: 09/23/2019 5:09:09 PM By: Linton Ham MD Entered By: Linton Ham on 09/23/2019 16:36:03 [name], [name] -------------------------------------------------------------------------------- Progress Note Details Patient Name: [name], [name]. Date of Service: 09/23/2019 2:00 PM Medical Record Number: [id number] Patient Account Number: [id number] Date of Birth/Sex: 03-20-1946 (73 y.o. F) Treating RN: Cornell Barman Primary Care Provider: [name] Other Clinician: Referring Provider: [name] Treating Provider/Extender: Tito Dine in Treatment: 41 Subjective History of Present Illness (HPI) Pleasant 73 year old with history of chronic venous insufficiency. No diabetes or peripheral vascular disease. Left ABI 1.29. Questionable history of left lower extremity DVT. She developed a recurrent ulceration on her left lateral calf in December 2015, which she attributes to poor diet and subsequent lower extremity edema. She underwent endovenous laser ablation of her left greater saphenous vein in 2010. She underwent laser ablation of accessory branch of left GSV in April 2016 by Dr. Kellie Simmering at [facility]. She was previously wearing Unna boots, which she tolerated well. Tolerating 2 layer compression and cadexomer iodine. She returns to clinic for follow-up and is without new complaints. She denies any significant pain at this time. She reports persistent pain with pressure. No claudication or ischemic rest pain. No fever or chills. No drainage. READMISSION 11/13/16; this is a 73 year old woman who is not a diabetic. She is here for a review of a painful area on her left medial lower extremity. I note that she was seen here previously last year for wound I believe to be in the same area. At that time she had undergone previously a left greater saphenous vein ablation by Dr. Kellie Simmering and she had a ablation of the anterior accessory branch of the left greater saphenous vein in March 2016. Seeing that the wound actually closed over. In reviewing the history with her today the ulcer in this area has been recurrent. She describes a biopsy of this area in 2009 that only showed stasis physiology. 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 Do not see that she has ever had arterial studies in the left leg. She tells me she has a follow-up with Dr. Kellie Simmering in roughly 10 days In any case she developed the reopening of this area roughly a month ago. On the background of this she describes rapidly increasing edema which has responded to Lasix 40 mg and metolazone 2.5 mg as well as the patient's lymph massage. She has been told she has both venous insufficiency and lymphedema but she cannot tolerate compression stockings 11/28/16; the patient saw Dr. Kellie Simmering recently. Per the patient he did arterial Dopplers in the office that did not show evidence of arterial insufficiency, per the patient he stated "treat this like an ordinary venous ulcer". She also saw her dermatologist Dr. Ronnald Ramp who felt that this was more of a vascular ulcer. In general things are improving although she arrives today with increasing bilateral lower extremity edema with weeping a deeper fluid through the wound on the left medial leg compatible with some degree of lymphedema 12/04/16; the patient's wound is fully  epithelialized but I don't think fully healed. We will do another week of depression with Promogran and TCA however I suspect we'll be able to discharge her next week. This is a very unusual-looking wound which was initially a figure-of-eight type wound lying on its side surrounded by petechial like hemorrhage. She has had venous ablation on this side. She apparently does not have an arterial issue per Dr. Kellie Simmering. She saw her dermatologist thought it was "vascular". Patient is definitely going to need ongoing compression and I talked about this with her today she will go to elastic therapy after she leaves here next week 12/11/16; the patient's wound is not completely closed today. She has surrounding scar tissue and in further discussion with the patient it would appear that she had ulcers in this  area in 2009 for a prolonged period of time ultimately requiring a punch biopsy of this area that only showed venous insufficiency. I did not previously pickup on this part of the history from the patient. 12/18/16; the patient's wound is completely epithelialized. There is no open area here. She has significant bilateral venous insufficiency with secondary lymphedema to a mild-to-moderate degree she does not have compression stockings.. She did not say anything to me when I was in the room, she told our intake nurse that she was still having pain in this area. This isn't unusual recurrent small open area. She is going to go to elastic therapy to obtain compression stockings. 12/25/16; the patient's wound is fully epithelialized. There is no open area here. The patient describes some continued episodic discomfort in this area medial left calf. However everything looks fine and healed here. She is been to elastic therapy and RHYTHM, RICOTTA ([id number]) caught herself 15-20 mmHg stockings, they apparently were having trouble getting 20-30 mm stockings in her size 01/22/17; this is a patient we discharged from the clinic a month ago. She has a recurrent open wound on her medial left calf. She had 15 mm support stockings. I told her I thought she needed 20-30 mm compression stockings. She tells me that she has been ill with hospitalization secondary to asthma and is been found to have severe hypokalemia likely secondary to a combination of Lasix and metolazone. This morning she noted blistering and leaking fluid on the posterior part of her left leg. She called our intake nurse urgently and we was saw her this afternoon. She has not had any real discomfort here. I don't know that she's been wearing any stockings on this leg for at least 2-3 days. ABIs in this clinic were 1.21 on the right and 1.3 on the left. She is previously seen vascular surgery who does not think that there is a peripheral arterial  issue. 01/30/17; Patient arrives with no open wound on the left leg. She has been to elastic therapy and obtained 20-32mmhg below knee stockings and she has one on the right leg today. READMISSION 02/19/18; this Machia is a now 73 year old patient we've had in this clinic perhaps 3 times before. I had last looked at her from January 07 December 2016 with an area on the medial left leg. We discharged her on 12/25/16 however she had to be readmitted on 01/22/17 with a recurrence. I have in my notes that we discharged her on 20-30 mm stockings although she tells me she was only wearing support hose because she cannot get stockings on predominantly related to her cervical spine surgery/issues. She has had previous ablations done by vein and vascular  in [place] including a great saphenous vein ablation on the left with an anterior accessory branch ablation I think both of these were in 2016. On one of the previous visit she had a biopsy noted 2009 that was negative. She is not felt to have an arterial issue. She is not a diabetic. She does have a history of obstructive sleep apnea hypertension asthma as well as chronic venous insufficiency and lymphedema. On this occasion she noted 2 dry scaly patch on her left leg. She tried to put lotion on this it didn't really help. There were 2 open areas.the patient has been seeing her primary physician from 02/05/18 through 02/14/18. She had Unna boots applied. The superior wound now on the lateral left leg has closed but she's had one wound that remains open on the lateral left leg. This is not the same spot as we dealt with in 2018. ABIs in this clinic were 1.3 bilaterally 02/26/18; patient has a small wound on the left lateral calf. Dimensions are down. She has chronic venous insufficiency and lymphedema. 03/05/18; small open area on the left lateral calf. Dimensions are down. Tightly adherent necrotic debris over the surface of the wound which was difficult to  remove. Also the dressing [over collagen] stuck to the wound surface. This was removed with some difficulty as well. Change the primary dressing to Hydrofera Blue ready 03/12/18; small open area on the left lateral calf. Comes in with tightly adherent surface eschar as well as some adherent Hydrofera Blue. 03/19/18; open area on the left lateral calf. Again adherent surface eschar as well as some adherent Hydrofera Blue nonviable subcutaneous tissue. She complained of pain all week even with the reduction from 4-3 layer compression I put on last week. Also she had an increase in her ankle and calf measurements probably related to the same thing. 03/26/18; open area on the left lateral calf. A very small open area remains here. We used silver alginate starting last week as the Hydrofera Blue seem to stick to the wound bed. In using 4-layer compression 04/02/18; the open area in the left lateral calf at some adherent slough which I removed there is no open area here. We are able to transition her into her own compression stocking. Truthfully I think this is probably his support hose. However this does not maintain skin integrity will be limited. She cannot put over the toe compression stockings on because of neck problems hand problems etc. She is allergic to the lining layer of juxta lites. We might be forced to use extremitease stocking should this fail READMIT 11/24/2018 Patient is now a 73 year old woman who is not a diabetic. She has been in this clinic on at least 3 previous occasions largely with recurrent wounds on her left leg secondary to chronic venous insufficiency with secondary lymphedema. Her situation is complicated by inability to get stockings on and an allergy to neoprene which is apparently a component and at least juxta lites and other stockings. As a result she really has not been wearing any stockings on her legs. She tells [place] that roughly 2 or 3 weeks ago she started noticing a  stinging sensation just above her ankle on the left medial aspect. She has been diagnosed with pseudogout and she wondered whether this was what she was experiencing. She tried to dress this with something she bought at the store however subsequently it pulled skin off and now she has an open wound that is not improving. She has been  using Vaseline gauze with a cover bandage. She saw her primary doctor last week who put an Haematologist on her. ABIs in this clinic was 1.03 on the left [name], [name]. ([id number]) 2/12; the area is on the left medial ankle. Odd-looking wound with what looks to be surface epithelialization but a multitude of small petechial openings. This clearly not closed yet. We have been using silver alginate under 3 layer compression with TCA 2/19; the wound area did not look quite as good this week. Necrotic debris over the majority of the wound surface which required debridement. She continues to have a multitude of what looked to be small petechial openings. She reminds [place] that she had a biopsy on this initially during her first outbreak in 2015 in [place] [facility]. She expresses concern about this being a possible melanoma. 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 I assured her that this does not look anything like melanoma. Except for the petechial reaction it does look like a venous insufficiency area and she certainly has evidence of this on both sides 2/26; a difficult area on the left medial ankle. The patient clearly has chronic venous hypertension with some degree of lymphedema. The odd thing about the area is the small petechial hemorrhages. I am not really sure how to explain this. This was present last time and this is not a compression injury. We have been using Hydrofera Blue which I changed to last week 3/4; still using Hydrofera Blue. Aggressive debridement today. She  does not have known arterial issues. She has seen Dr. Kellie Simmering at [facility] vein and vascular and and has an ablation on the left. [Anterior accessory branch of the greater saphenous]. From what I remember they did not feel she had an arterial issue. The patient has had this area biopsied in 2009 at [facility] dermatology and by her recollection they said this was "stasis". She is also follow-up with dermatology locally who thought that this was more of a vascular issue 3/11; using Hydrofera Blue. Aggressive debridement today. She does not have an arterial issue. We are using 3 layer compression although we may need to go to 4. The patient has been in for multiple changes to her wrap since I last saw her a week ago. She says that the area was leaking. I do not have too much more information on what was found 01/19/19 on evaluation today patient was actually being seen for a nurse visit when unfortunately she had the area on her left lateral lower extremity as well as weeping from the right lower extremity that became apparent. Therefore we did end up actually seeing her for a full visit with myself. She is having some pain at this site as well but fortunately nothing too significant at this point. No fevers, chills, nausea, or vomiting noted at this time. 3/18-Patient is back to the clinic with the left leg venous leg ulcer, the ulcer is larger in size, has a surface that is densely adherent with fibrinous tissue, the Hydrofera Blue was used but is densely adherent and there was difficulty in removing it. The right lower extremity was also wrapped for weeping edema. Patient has a new area over the left lateral foot above the malleolus that is small and appears to have no debris with intact surrounding skin. Patient is on increased dose of Lasix also as a means to edema management 3/25; the patient has a  nonhealing venous ulcer on the medial left leg and last week developed a smaller area on the  lateral left calf. We have been using Hydrofera Blue with a contact layer. 4/1; no major change in these wounds areas. Left medial and more recently left lateral calf. I tried Iodoflex last week to aid in debridement she did not tolerate this. She stated her pain was terrible all week. She took the top layer of the 4 layer compression off. 4/8; the patient actually looks somewhat better in terms of her more prominent left lateral calf wound. There is some healthy looking tissue here. She is still complaining of a lot of discomfort. 4/15; patient in a lot of pain secondary to sciatica. She is on a prednisone taper prescribed by her primary physician. She has the 2 areas one on the left medial and more recently a smaller area on the left lateral calf. Both of these just above the malleoli 4/22; her back pain is better but she still states she is very uncomfortable and now feels she is intolerant to the [employer]. No real change in the wounds we have been using Sorbact. She has been previously intolerant to Iodoflex. There is not a lot of option about what we can use to debride this wound under compression that she no doubt needs. sHe states Ultram no longer works for her pain 4/29; no major change in the wounds slightly increased depth. Surface on the original medial wound perhaps somewhat improved however the more recent area on the lateral left ankle is 100% covered in very adherent debris we have been using Sorbact. She tolerates 4 layer compression well and her edema control is a lot better. She has not had to come in for a nurse check 5/6; no major change in the condition of the wounds. She did consent to debridement today which was done with some difficulty. Continuing Sorbact. She did not tolerate Iodoflex. She was in for a check of her compression the day after we wrapped her last week this was adjusted but nothing much was found 5/13; no major change in the condition or area of the  wounds. I was able to get a fairly aggressive debridement done on the lateral left leg wound. Even using Sorbact under compression. She came back in on Friday to have the wrap changed. She says she felt uncomfortable on the lateral aspect of her ankle. She has a long history of chronic venous insufficiency including previous ablation surgery on this side. 5/20-Patient returns for wounds on left leg with both wounds covered in slough, with the lateral leg wound larger in size, she has been in 3 layer compression and felt more comfortable, she describes pain in ankle, in leg and pins and needles in foot, and is about to try Pamelor for this 6/3; wounds on the left lateral and left medial leg. The area medially which is the most recent of the 2 seems to have had the largest increase in dimensions. We have been using Sorbac to try and debride the surface. She has been to see orthopedics [name], [name] they apparently did a plain x-ray that was indeterminant. Diagnosed her with neuropathy and they have ordered an MRI to determine if there is underlying osteomyelitis. This was not high on my thought list but I suppose it is prudent. We have advised her to make an appointment with vein and vascular in [place]. She has a history of a left greater saphenous and accessory vein ablations  I wonder if there is anything else that can be done from a surgical point of view to help in these difficult refractory wounds. We have previously healed this wound on one occasion but it keeps on reopening [medial side] 6/10; deep tissue culture I did last week I think on the left medial wound showed both moderate E. coli and moderate staph aureus [MSSA]. She is going to require antibiotics and I have chosen Augmentin. We have been using Sorbact and we have made better looking wound surface on both sides but certainly no improvement in wound area. She was back in last Friday apparently for a dressing changes  the wrap was hurting her outer left ankle. She has not managed to get a hold of vein and vascular in [place]. We are going to have to make her that appointment 6/17; patient is tolerating the Augmentin. She had an MRI that I think was ordered by orthopedic surgeon this did not show osteomyelitis or an abscess did suggest cellulitis. We have been using Sorbact to the lateral and medial ankles. We have been trying to arrange a follow-up appointment with vein and vascular in [place] or did her original ablations. We apparently an area sent the request to vein and vascular in [facility] 6/24; patient has completed the Augmentin. We do not yet have a vein and vascular appointment in [place]. I am not sure what the issue is here we have asked her to call tomorrow. We are using Sorbact. Making some improvements and especially the medial wound. Both surfaces however look better medial and lateral. 7/1; the patient has been in contact with vein and vascular in [place] but has not yet received an appointment. Using Sorbact we have gradually improve the wound surface with no improvement in surface area. She is approved for Apligraf but the wound surface still is not completely viable. She has not had to come in for a dressing change 7/8; the patient has an appointment with vein and vascular on 7/31 which is a Friday afternoon. She is concerned about getting back here for [place] to dress her wounds. I think it is important to have them goal for her venous reflux/history of ablations etc. to see if anything else can be done. She apparently tested positive for 1 of the blood tests with regards to lupus and saw a rheumatologist. He has raised the issue of vasculitis again. I have had this thought in the past however the evidence seems overwhelming that this is a venous reflux etiology. If the rheumatologist tells me there is clinical and laboratory investigation is positive for lupus I will rethink  this. 7/15; the patient's wound surfaces are quite a bit better. The medial area which was her original wound now has no depth although the lateral wound which was the more recent area actually appears larger. Both with viable surfaces which is indeed better. Using Sorbact. I wanted to use Apligraf on her however there is the issue of the vein and vascular appointment on 7/31 at 2:00 in the afternoon which would not allow her to get back to be rewrapped and they would no doubt remove the graft 7/22; the patient's wound surfaces have moderate amount of debris although generally look better. The lateral one is larger with 2 small satellite areas superiorly. We are waiting for her vein and vascular appointment on 7/31. She has been approved for Apligraf which I would like to use after th 7/29; wound surfaces have improved no debridement is required we have  been using Sorbact. She sees vein and vascular on Friday with this so question of whether anything can be done to lessen the likelihood of recurrence and/or speed the healing of these areas. She is already had previous ablations. She no doubt has severe venous hypertension 8/5-Patient returns at 1 week, she was in [place] for 3 days by her podiatrist, we have been using so backed to the wound, she has increased pain in both the wounds on the left lower leg especially the more distal one on the lateral aspect 8/12-Patient returns at 1 week and she is agreeable to having debridement in both wounds on her left leg today. We have been using Sorbact, and vascular studies were reviewed at last visit 8/19; the patient arrives with her wounds fairly clean and no debridement is required. We have used Sorbact which is really done a nice job in cleaning up these very difficult wound surfaces. The patient saw Dr. Donzetta Matters of vascular surgery on 7/31. He did not feel that there was an arterial component. He felt that her treated greater saphenous vein is adequately  addressed and that the small saphenous vein did not appear to be involved significantly. She was also noted to have deep venous reflux which is not treatable. Dr. Donzetta Matters mentioned the possibility of a central obstructive component leading to reflux and he offered her central venography. She wanted to discuss this or think about it. I have urged her to go ahead with this. She has had recurrent difficult wounds in these areas which do heal but after months in the clinic. If there is anything that can be done to reduce the likelihood of this I think it is worth it. 9/2 she is still working towards getting follow-up with Dr. Donzetta Matters to schedule her CT. Things are quite a bit worse venography. I put Apligraf on 2 weeks ago on both wounds on the medial and lateral part of her left lower leg. She arrives in clinic today with 3 superficial additional wounds above the area laterally and one below the wound medially. She describes a lot of discomfort. I think these are probably wrapped injuries. Does not look like she has cellulitis. 07/20/2019 on evaluation today patient appears to be doing somewhat poorly in regard to her lower extremity ulcers. She in fact showed signs of erythema in fact we may even be dealing with an infection at this time. Unfortunately I am unsure if this is just infection or if indeed there may be some allergic reaction that occurred as a result of the Apligraf application. With that being said that would be unusual but nonetheless not impossible in this patient is one who is unfortunately allergic to quite a bit. Currently we have been using the Sorbact which seems to do as well as anything for her. I do think we may want to obtain [name], [name] a culture today to see if there is anything showing up there that may need to be addressed. 9/16; noted that last week the wounds look worse in 1 week follow-up of the Apligraf. Using Sorbact as of 2 days ago. She arrives with  copious amounts of drainage and new skin breakdown on the back of the left calf. The wounds arm more substantial bilaterally. There is a fair amount of swelling in the left calf no overt DVT there is edema present I think in the left greater than right thigh. She is supposed to go on 9/28 for CT venography. The wounds on  the medial and lateral calf are worse and she has new skin breakdown posteriorly at least new for me. This is almost developing into a circumferential wound area The Apligraf was taken off last week which I agree with things are not going in the right direction a culture was done we do not have that back yet. She is on Augmentin that she started 2 days ago 9/23; dressing was changed by her nurses on Monday. In general there is no improvement in the wound areas although the area looks less angry than last week. She did get Augmentin for MSSA cultured on the 14th. She still appears to have too much swelling in the left leg even with 3 layer compression 9/30; the patient underwent her procedure on 9/28 by Dr. Donzetta Matters at vascular and vein specialist. She was discovered to have the common iliac vein measuring 12.2 mm but at the level of L4-L5 measured 3 mm. After stenting it measured 10 mm. 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 Rouleaux flow in the common femoral and femoral vein was observed much improved after stenting. We are using silver alginate to the wounds on the medial and lateral ankle on the left. 4 layer compression 10/7; the patient had fluid swelling around her knee and 4 layer compression. At the advice of vein and vascular this was reduced to 3 layer which she is tolerating better. We have been using silver alginate under 3 layer compression since last Friday 10/14; arrives with the areas on the left ankle looking a lot better. Inflammation in the area also a lot better. She came in for a nurse check on 10/9 10/21; continued nice improvement. Slight  improvements in surface area of both the medial and lateral wounds on the left. A lot of the satellite lesions in the weeping erythema around these from stasis dermatitis is resolved. We have been using silver alginate 10/28; general improvement in the entire wound areas although not a lot of change in dimensions the wound certainly looks better. There is a lot less in terms of venous inflammation. Continue silver alginate this week however look towards Hydrofera Blue next week 11/4; very adherent debris on the medial wound left wound is not as bad. We have been using silver alginate. Change to [facility] today 11/11; very adherent debris on both wound areas. She went to vein and vascular last week and follow-up they put in [place] boot on this today. He says the [facility] was adherent. Wound is definitely not as good as last week. Especially on the left there the satellite lesions look more prominent 11/18; absolutely no better. erythema on lateral aspect with tenderness. Objective Constitutional Sitting or standing Blood Pressure is within target range for patient.. Pulse regular and within target range for patient.Marland Kitchen Respirations regular, non-labored and within target range.. Temperature is normal and within the target range for the patient.Marland Kitchen appears in no distress. Vitals Time Taken: 2:12 PM, Height: 63 in, Weight: 224.7 lbs, BMI: 39.8, Temperature: 98.9 F, Pulse: 88 bpm, Respiratory Rate: 16 breaths/min, Blood Pressure: 127/67 mmHg. Respiratory Respiratory effort is easy and symmetric bilaterally. Rate is normal at rest and on room air.Marland Kitchen [name], [name] Cardiovascular Pedal pulses palpable and strong bilaterally.. Edema control is quite good. Psychiatric No evidence of depression, anxiety, or agitation. Calm, cooperative, and communicative. Appropriate interactions and affect.. General Notes: wound exam; no debridement. erythema on the left lateral wound area with  tenderness. Still satellite lesions. Integumentary (Hair, Skin)  I am concerned about the erythema on the left lateral calf. Wound #5 status is Open. Original cause of wound was Gradually Appeared. The wound is located on the Left,Medial Lower Leg. The wound measures 2.8cm length x 3.5cm width x 0.2cm depth; 7.697cm^2 area and 1.539cm^3 volume. There is Fat Layer (Subcutaneous Tissue) Exposed exposed. There is no tunneling or undermining noted. There is a large amount of serosanguineous drainage noted. The wound margin is flat and intact. There is medium (34-66%) pale granulation within the wound bed. There is a medium (34-66%) amount of necrotic tissue within the wound bed including Adherent Slough. Wound #6 status is Open. Original cause of wound was Gradually Appeared. The wound is located on the Left,Lateral Lower Leg. The wound measures 5.5cm length x 3.1cm width x 0.2cm depth; 13.391cm^2 area and 2.678cm^3 volume. There is Fat Layer (Subcutaneous Tissue) Exposed exposed. There is no tunneling or undermining noted. There is a large amount of serosanguineous drainage noted. The wound margin is flat and intact. There is small (1-33%) pink granulation within the wound bed. There is a large (67-100%) amount of necrotic tissue within the wound bed including Adherent Slough. Wound #9 status is Open. Original cause of wound was Gradually Appeared. The wound is located on the Left,Lateral,Posterior Lower Leg. The wound measures 1.5cm length x 1.5cm width x 0.1cm depth; 1.767cm^2 area and 0.177cm^3 volume. There is Fat Layer (Subcutaneous Tissue) Exposed exposed. There is no tunneling or undermining noted. There is a large amount of serosanguineous drainage noted. The wound margin is flat and intact. There is small (1-33%) pink granulation within the wound bed. There is a large (67-100%) amount of necrotic tissue within the wound bed including Adherent Slough. Assessment Active  Problems ICD-10 Non-pressure chronic ulcer of left calf limited to breakdown of skin Chronic venous hypertension (idiopathic) with inflammation of right lower extremity Lymphedema, not elsewhere classified Plan Wound Cleansing: Wound #5 Left,Medial Lower Leg: Cleanse wound with mild soap and water Wound #6 Left,Lateral Lower Leg: Cleanse wound with mild soap and water Wound #9 Left,Lateral,Posterior Lower Leg: Cleanse wound with mild soap and water HADIYAH, MEHUS ([id number]) Anesthetic (add to Medication List): Wound #5 Left,Medial Lower Leg: Topical Lidocaine 4% cream applied to wound bed prior to debridement (In Clinic Only). Wound #6 Left,Lateral Lower Leg: Topical Lidocaine 4% cream applied to wound bed prior to debridement (In Clinic Only). Wound #9 Left,Lateral,Posterior Lower Leg: Topical Lidocaine 4% cream applied to wound bed prior to debridement (In Clinic Only). Skin Barriers/Peri-Wound Care: Wound #5 Left,Medial Lower Leg: Triamcinolone Acetonide Ointment (TCA) Wound #6 Left,Lateral Lower Leg: Triamcinolone Acetonide Ointment (TCA) Wound #9 Left,Lateral,Posterior Lower Leg: Triamcinolone Acetonide Ointment (TCA) Primary Wound Dressing: Wound #5 Left,Medial Lower Leg: Silver Alginate Wound #6 Left,Lateral Lower Leg: Silver Alginate Wound #9 Left,Lateral,Posterior Lower Leg: Silver Alginate Secondary Dressing: Wound #5 Left,Medial Lower Leg: Drawtex Wound #6 Left,Lateral Lower Leg: Drawtex Wound #9 Left,Lateral,Posterior Lower Leg: Drawtex Dressing Change Frequency: Wound #5 Left,Medial Lower Leg: Change dressing every week Other: - as needed. Wound #6 Left,Lateral Lower Leg: Change dressing every week Other: - as needed. Wound #9 Left,Lateral,Posterior Lower Leg: Change dressing every week Other: - as needed. Follow-up Appointments: Wound #5 Left,Medial Lower Leg: Return Appointment in 1 week. Nurse Visit as needed - Call of needed Wound #6  Left,Lateral Lower Leg: Return Appointment in 1 week. Nurse Visit as needed - Call of needed Wound #9 Left,Lateral,Posterior Lower Leg: Return Appointment in 1 week. Nurse Visit as needed - Call of  needed Edema Control: Wound #5 Left,Medial Lower Leg: 3 Layer Compression System - Left Lower Extremity - unna to anchor Wound #6 Left,Lateral Lower Leg: 3 Layer Compression System - Left Lower Extremity - unna to anchor Wound #9 Left,Lateral,Posterior Lower Leg: 3 Layer Compression System - Left Lower Extremity - unna to anchor Medications-please add to medication list.: Wound #5 Left,Medial Lower Leg: P.O. Antibiotics Wound #6 Left,Lateral Lower Leg: P.O. Antibiotics ROONEY, HARRALSON ([id number]) Wound #9 Left,Lateral,Posterior Lower Leg: P.O. Antibiotics The following medication(s) was prescribed: doxycycline monohydrate oral 100 mg capsule 1 capsule oral bid for 7 days for celluitis left leg starting 09/23/2019 1. I was concerned about the erythema tenderness on the left lateral calf. I did not culture anything but I put her in for doxycycline 100 twice daily empirically 2. Change the primary dressing to both areas to silver alginate. 3. After initially improving after her venous procedure she seems to have stalled once again. Electronic Signature(s) Signed: 09/23/2019 5:09:09 PM By: Linton Ham MD Entered By: Linton Ham on 09/23/2019 16:47:41 Matarazzo, [name] -------------------------------------------------------------------------------- SuperBill Details Patient Name: ADENA, DAIGNEAULT. Date of Service: 09/23/2019 Medical Record Number: [id number] Patient Account Number: [id number] Date of Birth/Sex: 05-Aug-1946 (73 y.o. F) Treating RN: Cornell Barman Primary Care Provider: [name] Other Clinician: Referring Provider: [name] Treating Provider/Extender: Tito Dine in Treatment: 41 Diagnosis Coding ICD-10 Codes Code  Description [phone] Non-pressure chronic ulcer of left calf limited to breakdown of skin I87.321 Chronic venous hypertension (idiopathic) with inflammation of right lower extremity I89.0 Lymphedema, not elsewhere classified Facility Procedures CPT4 Code: [id number] Description: (Facility Use Only) [phone] - [facility] Modifier: Quantity: 1 Physician Procedures CPT4 Code Description: [id number] - WC PHYS LEVEL 3 - EST PT ICD-10 Diagnosis Description L97.221 Non-pressure chronic ulcer of left calf limited to breakdown I87.321 Chronic venous hypertension (idiopathic) with inflammation of I89.0 Lymphedema, not  elsewhere classified Modifier: of skin right lower extr Quantity: 1 emity Electronic Signature(s) Signed: 09/23/2019 5:09:09 PM By: Linton Ham MD Entered By: Linton Ham on 09/23/2019 16:48:09

## 2019-09-30 ENCOUNTER — Encounter: Payer: Medicare Other | Admitting: Physician Assistant

## 2019-09-30 ENCOUNTER — Other Ambulatory Visit: Payer: Self-pay

## 2019-09-30 DIAGNOSIS — G4730 Sleep apnea, unspecified: Secondary | ICD-10-CM | POA: Diagnosis not present

## 2019-09-30 DIAGNOSIS — L97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d: Secondary | ICD-10-CM | POA: Diagnosis not present

## 2019-09-30 DIAGNOSIS — I890 Lymphedema, not elsewhere classified: Secondary | ICD-10-CM | POA: Diagnosis not present

## 2019-09-30 DIAGNOSIS — L97222 Non-pressure chronic ulcer of left calf with fat layer exposed: Secondary | ICD-10-CM | POA: Diagnosis not present

## 2019-09-30 DIAGNOSIS — I10 Essential (primary) hypertension: Secondary | ICD-10-CM | POA: Diagnosis not present

## 2019-09-30 DIAGNOSIS — I87312 Chronic venous hypertension (idiopathic) with ulcer of left lower extremity: Secondary | ICD-10-CM | POA: Diagnosis not present

## 2019-09-30 DIAGNOSIS — Z86718 Personal history of other venous thrombosis and embolism: Secondary | ICD-10-CM | POA: Diagnosis not present

## 2019-10-01 NOTE — Progress Notes (Signed)
[name] Visit Report for 09/30/2019 Chief Complaint Document Details Patient Name: [name], LU. Date of Service: 09/30/2019 12:45 PM Medical Record Number: [id number] Patient Account Number: [id number] Date of Birth/Sex: 07-03-1946 (73 y.o. F) Treating RN: [name] Primary Care Provider: Ria [name] Other Clinician: Referring Provider: Ria [name] Treating Provider/Extender: [name] Weeks in Treatment: 40 Information Obtained from: Patient Chief Complaint Left calf venous stasis ulceration. 11/13/16; the patient is here for a left calf recurrent ulceration which is painful and has been present for the last month 01/22/17; the patient re-presents here for recurrent difficulties with blistering and leaking fluid on her left posterior calf 12/10/18; the patient returns to clinic for a wound on the left medial calf Electronic Signature(s) Signed: 09/30/2019 4:47:05 PM By: Amber Keeler [name] Entered By: [name] on 09/30/2019 13:03:06 [name], [name] -------------------------------------------------------------------------------- Debridement Details Patient Name: [name], [name]. Date of Service: 09/30/2019 12:45 PM Medical Record Number: [id number] Patient Account Number: [id number] Date of Birth/Sex: 01/22/1946 (73 y.o. F) Treating RN: [name] Primary Care Provider: Ria [name] Other Clinician: Referring Provider: Ria [name] Treating Provider/Extender: [name] Weeks in Treatment: 42 Debridement Performed for Wound #5 Left,Medial Lower Leg Assessment: Performed By: Physician Amber III, [name] E., [name] Debridement Type: Debridement Level of Consciousness (Pre- Awake and Alert procedure): Pre-procedure Verification/Time Yes - 13:29 Out Taken: Start Time: 13:29 Pain Control: Lidocaine Total Area Debrided (L x W): 2.7 (cm) x 2.6 (cm) = 7.02 (cm) Tissue and other material Viable, Non-Viable,  Slough, Subcutaneous, Slough debrided: Level: Skin/Subcutaneous Tissue Debridement Description: Excisional Instrument: Curette Bleeding: Minimum Hemostasis Achieved: Pressure End Time: 13:35 Response to Treatment: Procedure was tolerated well Level of Consciousness Awake and Alert (Post-procedure): Post Debridement Measurements of Total Wound Length: (cm) 2.7 Width: (cm) 2.6 Depth: (cm) 0.2 Volume: (cm) 1.103 Character of Wound/Ulcer Post Debridement: Stable Post Procedure Diagnosis Same as Pre-procedure Electronic Signature(s) Signed: 09/30/2019 4:47:05 PM By: Amber Keeler [name] Signed: 09/30/2019 5:12:33 PM By: [name], [name], RN, CWS, [name] Entered By: [name] on 09/30/2019 13:30:22 [name], [name] -------------------------------------------------------------------------------- Debridement Details Patient Name: [name], [name]. Date of Service: 09/30/2019 12:45 PM Medical Record Number: [id number] Patient Account Number: [id number] Date of Birth/Sex: 1946-04-14 (73 y.o. F) Treating RN: [name] Primary Care Provider: Ria [name] Other Clinician: Referring Provider: Ria [name] Treating Provider/Extender: [name] Weeks in Treatment: 42 Debridement Performed for Wound #6 Left,Lateral Lower Leg Assessment: Performed By: Physician Amber III, [name] E., [name] Debridement Type: Debridement Severity of Tissue Pre Fat layer exposed Debridement: Level of Consciousness (Pre- Awake and Alert procedure): Pre-procedure Verification/Time Yes - 13:29 Out Taken: Start Time: 13:29 Pain Control: Lidocaine Total Area Debrided (L x W): 5.3 (cm) x 2.5 (cm) = 13.25 (cm) Tissue and other material Viable, Non-Viable, Slough, Subcutaneous, Slough debrided: Level: Skin/Subcutaneous Tissue Debridement Description: Excisional Instrument: Curette Bleeding: Minimum Hemostasis Achieved: Pressure End Time: 13:35 Response to  Treatment: Procedure was tolerated well Level of Consciousness Awake and Alert (Post-procedure): Post Debridement Measurements of Total Wound Length: (cm) 5.3 Width: (cm) 2.5 Depth: (cm) 0.3 Volume: (cm) 3.122 Character of Wound/Ulcer Post Debridement: Stable Severity of Tissue Post Debridement: Fat layer exposed Post Procedure Diagnosis Same as Pre-procedure Electronic Signature(s) Signed: 09/30/2019 4:47:05 PM By: Amber Keeler [name] Signed: 09/30/2019 5:12:33 PM By: [name], [name], RN, CWS, [name] Entered By: [name] on 09/30/2019 13:31:15 [name], [name] -------------------------------------------------------------------------------- HPI  Details Patient Name: [name], [name]. Date of Service: 09/30/2019 12:45 PM Medical Record Number: [id number] Patient Account Number: [id number] Date of Birth/Sex: 03-07-1946 (73 y.o. F) Treating RN: [name] Primary Care Provider: Ria [name] Other Clinician: Referring Provider: Ria [name] Treating Provider/Extender: Amber III, [name] Weeks in Treatment: 52 History of Present Illness HPI Description: Pleasant 73 year old with history of chronic venous insufficiency. No diabetes or peripheral vascular disease. Left ABI 1.29. Questionable history of left lower extremity DVT. She developed a recurrent ulceration on her left lateral calf in December 2015, which she attributes to poor diet and subsequent lower extremity edema. She underwent endovenous laser ablation of her left greater saphenous vein in 2010. She underwent laser ablation of accessory branch of left GSV in April 2016 by Dr. Kellie Simmering at [facility]. She was previously wearing Unna boots, which she tolerated well. Tolerating 2 layer compression and cadexomer iodine. She returns to clinic for follow-up and is without new complaints. She denies any significant pain at this time. She reports persistent pain with pressure. No claudication or  ischemic rest pain. No fever or chills. No drainage. READMISSION 11/13/16; this is a 73 year old woman who is not a diabetic. She is here for a review of a painful area on her left medial lower extremity. I note that she was seen here previously last year for wound I believe to [name] in the same area. At that time she had undergone previously a left greater saphenous vein ablation by Dr. Kellie Simmering and she had a ablation of the anterior accessory branch of the left greater saphenous vein in March 2016. Seeing that the wound actually closed over. In reviewing the history with her today the ulcer in this area has been recurrent. She describes a biopsy of this area in 2009 that only showed stasis physiology. 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 Do not see that she has ever had arterial studies in the left leg. She tells me she has a follow-up with Dr. Kellie Simmering in roughly 10 days In any case she developed the reopening of this area roughly a month ago. On the background of this she describes rapidly increasing edema which has responded to Lasix 40 mg and metolazone 2.5 mg as well as the patient's lymph massage. She has been told she has both venous insufficiency and lymphedema but she cannot tolerate compression stockings 11/28/16; the patient saw Dr. Kellie Simmering recently. Per the patient he did arterial Dopplers in the office that did not show evidence of arterial insufficiency, per the patient he stated "treat this like an ordinary venous ulcer". She also saw her dermatologist Dr. Ronnald Ramp who felt that this was more of a vascular ulcer. In general things are improving although she arrives today with increasing bilateral lower extremity edema with weeping a deeper fluid through the wound on the  left medial leg compatible with some degree of lymphedema 12/04/16; the patient's wound is fully epithelialized but I don't think fully healed. We will do another week of depression with Promogran and TCA however I suspect we'll [name] able to discharge her next week. This is a very unusual-looking wound which was initially a figure-of-eight type wound lying on its side surrounded by petechial like hemorrhage. She has had venous ablation on this side. She  apparently does not have an arterial issue per Dr. Kellie Simmering. She saw her dermatologist thought it was "vascular". Patient is definitely going to need ongoing compression and I talked about this with her today she will go to elastic therapy after she leaves here next week 12/11/16; the patient's wound is not completely closed today. She has surrounding scar tissue and in further discussion with the patient it would appear that she had ulcers in this area in 2009 for a prolonged period of time ultimately requiring a punch biopsy of this area that only showed venous insufficiency. I did not previously pickup on this part of the history from the patient. 12/18/16; the patient's wound is completely epithelialized. There is no open area here. She has significant bilateral venous insufficiency with secondary lymphedema to a mild-to-moderate degree she does not have compression stockings.. She did not say anything to me when I was in the room, she told our intake nurse that she was still having pain in this area. This isn't unusual recurrent small open area. She is going to go to elastic therapy to obtain compression stockings. 12/25/16; the patient's wound is fully epithelialized. There is no open area here. The patient describes some continued episodic discomfort in this area medial left calf. However everything looks fine and healed here. She is been to elastic therapy and caught herself 15-20 mmHg stockings, they apparently were having trouble getting 20-30 mm  stockings in her size [name], [name] 01/22/17; this is a patient we discharged from the clinic a month ago. She has a recurrent open wound on her medial left calf. She had 15 mm support stockings. I told her I thought she needed 20-30 mm compression stockings. She tells me that she has been ill with hospitalization secondary to asthma and is been found to have severe hypokalemia likely secondary to a combination of Lasix and metolazone. This morning she noted blistering and leaking fluid on the posterior part of her left leg. She called our intake nurse urgently and we was saw her this afternoon. She has not had any real discomfort here. I don't know that she's been wearing any stockings on this leg for at least 2-3 days. ABIs in this clinic were 1.21 on the right and 1.3 on the left. She is previously seen vascular surgery who does not think that there is a peripheral arterial issue. 01/30/17; Patient arrives with no open wound on the left leg. She has been to elastic therapy and obtained 20-46mmhg below knee stockings and she has one on the right leg today. READMISSION 02/19/18; this Beecher is a now 74 year old patient we've had in this clinic perhaps 3 times before. I had last looked at her from January 07 December 2016 with an area on the medial left leg. We discharged her on 12/25/16 however she had to [name] readmitted on 01/22/17 with a recurrence. I have in my notes that we discharged her on 20-30 mm stockings although she tells me she was only wearing support hose because she cannot get stockings on predominantly related to her cervical spine surgery/issues. She has had previous ablations done by vein and vascular in [place] including a great saphenous vein ablation on the left with an anterior accessory branch ablation I think both of these were in 2016. On one of the previous visit she had a biopsy noted 2009 that was negative. She is not felt to have an arterial issue. She is not  a diabetic. She does have a history of  obstructive sleep apnea hypertension asthma as well as chronic venous insufficiency and lymphedema. On this occasion she noted 2 dry scaly patch on her left leg. She tried to put lotion on this it didn't really help. There were 2 open areas.the patient has been seeing her primary physician from 02/05/18 through 02/14/18. She had Unna boots applied. The superior wound now on the lateral left leg has closed but she's had one wound that remains open on the lateral left leg. This is not the same spot as we dealt with in 2018. ABIs in this clinic were 1.3 bilaterally 02/26/18; patient has a small wound on the left lateral calf. Dimensions are down. She has chronic venous insufficiency and lymphedema. 03/05/18; small open area on the left lateral calf. Dimensions are down. Tightly adherent necrotic debris over the surface of the wound which was difficult to remove. Also the dressing [over collagen] stuck to the wound surface. This was removed with some difficulty as well. Change the primary dressing to Hydrofera Blue ready 03/12/18; small open area on the left lateral calf. Comes in with tightly adherent surface eschar as well as some adherent Hydrofera Blue. 03/19/18; open area on the left lateral calf. Again adherent surface eschar as well as some adherent Hydrofera Blue nonviable subcutaneous tissue. She complained of pain all week even with the reduction from 4-3 layer compression I put on last week. Also she had an increase in her ankle and calf measurements probably related to the same thing. 03/26/18; open area on the left lateral calf. A very small open area remains here. We used silver alginate starting last week as the Hydrofera Blue seem to stick to the wound bed. In using 4-layer compression 04/02/18; the open area in the left lateral calf at some adherent slough which I removed there is no open area here. We are able to transition her into her own compression  stocking. Truthfully I think this is probably his support hose. However this does not maintain skin integrity will [name] limited. She cannot put over the toe compression stockings on because of neck problems hand problems etc. She is allergic to the lining layer of juxta lites. We might [name] forced to use extremitease stocking should this fail READMIT 11/24/2018 Patient is now a 73 year old woman who is not a diabetic. She has been in this clinic on at least 3 previous occasions largely with recurrent wounds on her left leg secondary to chronic venous insufficiency with secondary lymphedema. Her situation is complicated by inability to get stockings on and an allergy to neoprene which is apparently a component and at least juxta lites and other stockings. As a result she really has not been wearing any stockings on her legs. She tells [place] that roughly 2 or 3 weeks ago she started noticing a stinging sensation just above her ankle on the left medial aspect. She has been diagnosed with pseudogout and she wondered whether this was what she was experiencing. She tried to dress this with something she bought at the store however subsequently it pulled skin off and now she has an open wound that is not improving. She has been using Vaseline gauze with a cover bandage. She saw her primary doctor last week who put an Haematologist on her. ABIs in this clinic was 1.03 on the left [name], [name]. ([id number]) 2/12; the area is on the left medial ankle. Odd-looking wound with what looks to [name] surface epithelialization but a multitude of small petechial openings. This clearly  not closed yet. We have been using silver alginate under 3 layer compression with TCA 2/19; the wound area did not look quite as good this week. Necrotic debris over the majority of the wound surface which required debridement. She continues to have a multitude of what looked to [name] small petechial openings. She reminds [place] that she had a biopsy on  this initially during her first outbreak in 2015 in [place] dermatology. She expresses concern about this being a possible melanoma. 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 I assured her that this does not look anything like melanoma. Except for the petechial reaction it does look like a venous insufficiency area and she certainly has evidence of this on both sides 2/26; a difficult area on the left medial ankle. The patient clearly has chronic venous hypertension with some degree of lymphedema. The odd thing about the area is the small petechial hemorrhages. I am not really sure how to explain this. This was present last time and this is not a compression injury. We have been using Hydrofera Blue which I changed to last week 3/4; still using Hydrofera Blue. Aggressive debridement today. She does not have known arterial issues. She has seen Dr. Kellie Simmering at [facility] vein [facility] vascular and and has an ablation on the left. [Anterior accessory branch of the greater saphenous]. From what I remember they did not feel she had an arterial issue. The patient has had this area biopsied in 2009 at [facility] dermatology and by her recollection they said this was "stasis". She is also follow-up with dermatology locally who thought that this was more of a vascular issue 3/11; using Hydrofera Blue. Aggressive debridement today. She does not have an arterial issue. We are using 3 layer compression although we may need to go to 4. The patient has been in for multiple changes to her wrap since I last saw her a week ago. She says that the area was leaking. I do not have too much more information on what was found 01/19/19 on evaluation today patient was actually being seen for a nurse visit when unfortunately she had the area on her left lateral lower extremity as well as weeping from the right lower extremity that became  apparent. Therefore we did end up actually seeing her for a full visit with myself. She is having some pain at this site as well but fortunately nothing too significant at this point. No fevers, chills, nausea, or vomiting noted at this time. 3/18-Patient is back to the clinic with the left leg venous leg ulcer, the ulcer is larger in size, has a surface that is densely adherent with fibrinous tissue, the Hydrofera Blue was used but is densely adherent and there was difficulty in removing it. The right lower extremity was also wrapped for weeping edema. Patient has a new area over the left lateral foot above the malleolus that is small and appears to have no debris with intact surrounding skin. Patient is on increased dose of Lasix also as a means to edema management 3/25; the patient has a nonhealing venous ulcer on the medial left leg and last week developed a smaller area on the lateral left calf. We have been using Hydrofera Blue with a contact layer. 4/1; no major change in these wounds areas. Left medial and more recently left lateral calf. I tried Iodoflex last week to aid in debridement she did not tolerate this.  She stated her pain was terrible all week. She took the top layer of the 4 layer compression off. 4/8; the patient actually looks somewhat better in terms of her more prominent left lateral calf wound. There is some healthy looking tissue here. She is still complaining of a lot of discomfort. 4/15; patient in a lot of pain secondary to sciatica. She is on a prednisone taper prescribed by her primary physician. She has the 2 areas one on the left medial and more recently a smaller area on the left lateral calf. Both of these just above the malleoli 4/22; her back pain is better but she still states she is very uncomfortable and now feels she is intolerant to the [employer]. No real change in the wounds we have been using Sorbact. She has been previously intolerant to Iodoflex. There  is not a lot of option about what we can use to debride this wound under compression that she no doubt needs. sHe states Ultram no longer works for her pain 4/29; no major change in the wounds slightly increased depth. Surface on the original medial wound perhaps somewhat improved however the more recent area on the lateral left ankle is 100% covered in very adherent debris we have been using Sorbact. She tolerates 4 layer compression well and her edema control is a lot better. She has not had to come in for a nurse check 5/6; no major change in the condition of the wounds. She did consent to debridement today which was done with some difficulty. Continuing Sorbact. She did not tolerate Iodoflex. She was in for a check of her compression the day after we wrapped her last week this was adjusted but nothing much was found 5/13; no major change in the condition or area of the wounds. I was able to get a fairly aggressive debridement done on the lateral left leg wound. Even using Sorbact under compression. She came back in on Friday to have the wrap changed. She says she felt uncomfortable on the lateral aspect of her ankle. She has a long history of chronic venous insufficiency including previous ablation surgery on this side. 5/20-Patient returns for wounds on left leg with both wounds covered in slough, with the lateral leg wound larger in size, she has been in 3 layer compression and felt more comfortable, she describes pain in ankle, in leg and pins and needles in foot, and is about to try Pamelor for this 6/3; wounds on the left lateral and left medial leg. The area medially which is the most recent of the 2 seems to have had the largest increase in dimensions. We have been using Sorbac to try and debride the surface. She has been to see orthopedics they apparently did a plain x-ray that was indeterminant. Diagnosed her with neuropathy and they have ordered an MRI to SURAYA, RAJAGOPAL.  ([id number]) determine if there is underlying osteomyelitis. This was not high on my thought list but I suppose it is prudent. We have advised her to make an appointment with vein and vascular in [place]. She has a history of a left greater saphenous and accessory vein ablations I wonder if there is anything else that can [name] done from a surgical point of view to help in these difficult refractory wounds. We have previously healed this wound on one occasion but it keeps on reopening [medial side] 6/10; deep tissue culture I did last week I think on the left medial wound showed both moderate E. coli  and moderate staph aureus [MSSA]. She is going to require antibiotics and I have chosen Augmentin. We have been using Sorbact and we have made better looking wound surface on both sides but certainly no improvement in wound area. She was back in last Friday apparently for a dressing changes the wrap was hurting her outer left ankle. She has not managed to get a hold of vein and vascular in [place]. We are going to have to make her that appointment 6/17; patient is tolerating the Augmentin. She had an MRI that I think was ordered by orthopedic surgeon this did not show osteomyelitis or an abscess did suggest cellulitis. We have been using Sorbact to the lateral and medial ankles. We have been trying to arrange a follow-up appointment with vein and vascular in [place] or did her original ablations. We apparently an area sent the request to vein and vascular in [facility] 6/24; patient has completed the Augmentin. We do not yet have a vein and vascular appointment in [place]. I am not sure what the issue is here we have asked her to call tomorrow. We are using Sorbact. Making some improvements and especially the medial wound. Both surfaces however look better medial and lateral. 7/1; the patient has been in contact with vein and vascular in [place] but has not yet received an appointment.  Using Sorbact we have gradually improve the wound surface with no improvement in surface area. She is approved for Apligraf but the wound surface still is not completely viable. She has not had to come in for a dressing change 7/8; the patient has an appointment with vein and vascular on 7/31 which is a Friday afternoon. She is concerned about getting back here for [place] to dress her wounds. I think it is important to have them goal for her venous reflux/history of ablations etc. to see if anything else can [name] done. She apparently tested positive for 1 of the blood tests with regards to lupus and saw a rheumatologist. He has raised the issue of vasculitis again. I have had this thought in the past however the evidence seems overwhelming that this is a venous reflux etiology. If the rheumatologist tells me there is clinical and laboratory investigation is positive for lupus I will rethink this. 7/15; the patient's wound surfaces are quite a bit better. The medial area which was her original wound now has no depth although the lateral wound which was the more recent area actually appears larger. Both with viable surfaces which is indeed better. Using Sorbact. I wanted to use Apligraf on her however there is the issue of the vein and vascular appointment on 7/31 at 2:00 in the afternoon which would not allow her to get back to [name] rewrapped and they would no doubt remove the graft 7/22; the patient's wound surfaces have moderate amount of debris although generally look better. The lateral one is larger with 2 small satellite areas superiorly. We are waiting for her vein and vascular appointment on 7/31. She has been approved for Apligraf which I would like to use after th 7/29; wound surfaces have improved no debridement is required we have been using Sorbact. She sees vein and vascular on Friday with this so question of whether anything can [name] done to lessen the likelihood of recurrence and/or speed the  healing of these areas. She is already had previous ablations. She no doubt has severe venous hypertension 8/5-Patient returns at 1 week, she was in [employer] for 3 days by  her podiatrist, we have been using so backed to the wound, she has increased pain in both the wounds on the left lower leg especially the more distal one on the lateral aspect 8/12-Patient returns at 1 week and she is agreeable to having debridement in both wounds on her left leg today. We have been using Sorbact, and vascular studies were reviewed at last visit 8/19; the patient arrives with her wounds fairly clean and no debridement is required. We have used Sorbact which is really done a nice job in cleaning up these very difficult wound surfaces. The patient saw Dr. Donzetta Matters of vascular surgery on 7/31. He did not feel that there was an arterial component. He felt that her treated greater saphenous vein is adequately addressed and that the small saphenous vein did not appear to [name] involved significantly. She was also noted to have deep venous reflux which is not treatable. Dr. Donzetta Matters mentioned the possibility of a central obstructive component leading to reflux and he offered her central venography. She wanted to discuss this or think about it. I have urged her to go ahead with this. She has had recurrent difficult wounds in these areas which do heal but after months in the clinic. If there is anything that can [name] done to reduce the likelihood of this I think it is Amber it. 9/2 she is still working towards getting follow-up with Dr. Donzetta Matters to schedule her CT. Things are quite a bit worse venography. I put Apligraf on 2 weeks ago on both wounds on the medial and lateral part of her left lower leg. She arrives in clinic today with 3 superficial additional wounds above the area laterally and one below the wound medially. She describes a lot of discomfort. I think these are probably wrapped injuries. Does not look like she has  cellulitis. 07/20/2019 on evaluation today patient appears to [name] doing somewhat poorly in regard to her lower extremity ulcers. She in fact showed signs of erythema in fact we may even [name] dealing with an infection at this time. Unfortunately I am unsure if this is just infection or if indeed there may [name] some allergic reaction that occurred as a result of the Apligraf application. With that being said that would [name] unusual but nonetheless not impossible in this patient is one who is unfortunately allergic to quite a bit. Currently we have been using the Sorbact which seems to do as well as anything for her. I do think we may want to obtain a culture today to see if there is anything showing up there that may need to [name] addressed. ARNETTA, WUBBEN ([id number]) 9/16; noted that last week the wounds look worse in 1 week follow-up of the Apligraf. Using Sorbact as of 2 days ago. She arrives with copious amounts of drainage and new skin breakdown on the back of the left calf. The wounds arm more substantial bilaterally. There is a fair amount of swelling in the left calf no overt DVT there is edema present I think in the left greater than right thigh. She is supposed to go on 9/28 for CT venography. The wounds on the medial and lateral calf are worse and she has new skin breakdown posteriorly at least new for me. This is almost developing into a circumferential wound area The Apligraf was taken off last week which I agree with things are not going in the right direction a culture was done we do not have that back yet. She is on  Augmentin that she started 2 days ago 9/23; dressing was changed by her nurses on Monday. In general there is no improvement in the wound areas although the area looks less angry than last week. She did get Augmentin for MSSA cultured on the 14th. She still appears to have too much swelling in the left leg even with 3 layer compression 9/30; the patient underwent her procedure on  9/28 by Dr. Donzetta Matters at [facility] and vein specialist. She was discovered to have the common iliac vein measuring 12.2 mm but at the level of L4-L5 measured 3 mm. After stenting it measured 10 mm. 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 Rouleaux flow in the common femoral and femoral vein was observed much improved after stenting. We are using silver alginate to the wounds on the medial and lateral ankle on the left. 4 layer compression 10/7; the patient had fluid swelling around her knee and 4 layer compression. At the advice of vein and vascular this was reduced to 3 layer which she is tolerating better. We have been using silver alginate under 3 layer compression since last Friday 10/14; arrives with the areas on the left ankle looking a lot better. Inflammation in the area also a lot better. She came in for a nurse check on 10/9 10/21; continued nice improvement. Slight improvements in surface area of both the medial and lateral wounds on the left. A lot of the satellite lesions in the weeping erythema around these from stasis dermatitis is resolved. We have been using silver alginate 10/28; general improvement in the entire wound areas although not a lot of change in dimensions the wound certainly looks better. There is a lot less in terms of venous inflammation. Continue silver alginate this week however look towards Hydrofera Blue next week 11/4; very adherent debris on the medial wound left wound is not as bad. We have been using silver alginate. Change to [facility] today 11/11; very adherent debris on both wound areas. She went to vein and vascular last week and follow-up they put in Galva boot on this today. He says the [facility] was adherent. Wound is definitely not as good as last week. Especially on the left there the satellite lesions look more prominent 11/18; absolutely no better. erythema on lateral aspect with tenderness. 09/30/2019 on evaluation today  patient appears to actually [name] doing better. Dr. Dellia Nims did put her on doxycycline last week which I do believe has helped her at this point. Fortunately there is no signs of active infection at this time. No fevers, chills, nausea, vomiting, or diarrhea. I do believe he may want extend the doxycycline for 7 additional days just to ensure everything does completely cleared up the patient is in agreement with that plan. Otherwise she is going require some sharp debridement today Electronic Signature(s) Signed: 09/30/2019 4:47:05 PM By: Amber Keeler [name] Entered By: [name] on 09/30/2019 13:34:06 Scherr, [name] -------------------------------------------------------------------------------- Physical Exam Details Patient Name: [name], [name]. Date of Service: 09/30/2019 12:45 PM Medical Record Number: [id number] Patient Account Number: [id number] Date of Birth/Sex: 10-[name]-1946 (73 y.o. F) Treating RN: [name] Primary Care Provider: Ria [name] Other Clinician: Referring Provider: Ria [name] Treating Provider/Extender: Amber III, [name] Weeks in Treatment: 46 Constitutional [name] and well-hydrated in no acute distress. Respiratory normal breathing without difficulty. clear to auscultation bilaterally. Cardiovascular regular rate and rhythm with normal S1, S2. Psychiatric this patient is able to make decisions and demonstrates good insight  into disease process. Alert and Oriented x 3. pleasant and cooperative. Notes I did actually perform sharp debridement over the patient's wounds today at all locations in order to clear away slough and necrotic tissue as well as biofilm down to good subcutaneous tissue which she tolerated without complication and post debridement the wound bed appears to [name] doing significantly better which is great news. Overall I am very pleased with how things seem to [name] progressing. Electronic Signature(s) Signed:  09/30/2019 4:47:05 PM By: Amber Keeler [name] Entered By: [name] on 09/30/2019 13:34:59 [name], [name] -------------------------------------------------------------------------------- Physician Orders Details Patient Name: [name], [name]. Date of Service: 09/30/2019 12:45 PM Medical Record Number: [id number] Patient Account Number: [id number] Date of Birth/Sex: Sep 22, 1946 (73 y.o. F) Treating RN: [name] Primary Care Provider: Ria [name] Other Clinician: Referring Provider: Ria [name] Treating Provider/Extender: [name] Weeks in Treatment: 21 Verbal / Phone Orders: No Diagnosis Coding ICD-10 Coding Code Description L97.221 Non-pressure chronic ulcer of left calf limited to breakdown of skin I87.321 Chronic venous hypertension (idiopathic) with inflammation of right lower extremity I89.0 Lymphedema, not elsewhere classified Wound Cleansing Wound #5 Left,Medial Lower Leg o Cleanse wound with mild soap and water Wound #6 Left,Lateral Lower Leg o Cleanse wound with mild soap and water Wound #9 Left,Lateral,Posterior Lower Leg o Cleanse wound with mild soap and water Anesthetic (add to Medication List) Wound #5 Left,Medial Lower Leg o Topical Lidocaine 4% cream applied to wound bed prior to debridement (In Clinic Only). Wound #6 Left,Lateral Lower Leg o Topical Lidocaine 4% cream applied to wound bed prior to debridement (In Clinic Only). Wound #9 Left,Lateral,Posterior Lower Leg o Topical Lidocaine 4% cream applied to wound bed prior to debridement (In Clinic Only). Skin Barriers/Peri-Wound Care Wound #5 Left,Medial Lower Leg o Triamcinolone Acetonide Ointment (TCA) Wound #6 Left,Lateral Lower Leg o Triamcinolone Acetonide Ointment (TCA) Wound #9 Left,Lateral,Posterior Lower Leg o Triamcinolone Acetonide Ointment (TCA) Primary Wound Dressing Wound #5 Left,Medial Lower Leg o Silver Alginate - adaptic for  sticking Wound #6 Left,Lateral Lower Leg o Silver Alginate - adaptic for sticking [name], [name] Wound #9 Left,Lateral,Posterior Lower Leg o Silver Alginate - adaptic for sticking Secondary Dressing Wound #5 Left,Medial Lower Leg o Drawtex Wound #6 Left,Lateral Lower Leg o Drawtex Wound #9 Left,Lateral,Posterior Lower Leg o Drawtex Dressing Change Frequency Wound #5 Left,Medial Lower Leg o Change dressing every week o Other: - as needed. Wound #6 Left,Lateral Lower Leg o Change dressing every week o Other: - as needed. Wound #9 Left,Lateral,Posterior Lower Leg o Change dressing every week o Other: - as needed. Follow-up Appointments Wound #5 Left,Medial Lower Leg o Return Appointment in 1 week. o Nurse Visit as needed - Call of needed Wound #6 Left,Lateral Lower Leg o Return Appointment in 1 week. o Nurse Visit as needed - Call of needed Wound #9 Left,Lateral,Posterior Lower Leg o Return Appointment in 1 week. o Nurse Visit as needed - Call of needed Edema Control Wound #5 Left,Medial Lower Leg o 3 Layer Compression System - Left Lower Extremity - unna to anchor Wound #6 Left,Lateral Lower Leg o 3 Layer Compression System - Left Lower Extremity - unna to anchor Wound #9 Left,Lateral,Posterior Lower Leg o 3 Layer Compression System - Left Lower Extremity - unna to anchor Medications-please add to medication list. Wound #5 Left,Medial Lower Leg o P.O. Antibiotics - Continue antibiotics Wound #6 Left,Lateral Lower Leg [name], BEBEE. ([id number]) o P.O. Antibiotics - Continue antibiotics Wound #9 Left,Lateral,Posterior  Lower Leg o P.O. Antibiotics - Continue antibiotics Patient Medications Allergies: Sulfa (Sulfonamide Antibiotics), latex, Neoprene Notifications Medication Indication Start End doxycycline hyclate 09/30/2019 DOSE 1 - oral 100 mg capsule - 1 capsule oral taken 2 times a day for 7 days to extend  the current regimen Electronic Signature(s) Signed: 09/30/2019 1:36:13 PM By: Amber Keeler [name] Entered By: [name] on 09/30/2019 13:36:12 M[place]olejos, [name] -------------------------------------------------------------------------------- Problem List Details Patient Name: EMILYROSE, LANTEIGNE. Date of Service: 09/30/2019 12:45 PM Medical Record Number: [id number] Patient Account Number: [id number] Date of Birth/Sex: 1946-03-22 (73 y.o. F) Treating RN: [name] Primary Care Provider: Ria [name] Other Clinician: Referring Provider: Ria [name] Treating Provider/Extender: [name] Weeks in Treatment: 42 Active Problems ICD-10 Evaluated Encounter Code Description Active Date Today Diagnosis L97.221 Non-pressure chronic ulcer of left calf limited to breakdown of 01/07/2019 No Yes skin I87.321 Chronic venous hypertension (idiopathic) with inflammation of 12/10/2018 No Yes right lower extremity I89.0 Lymphedema, not elsewhere classified 12/10/2018 No Yes Inactive Problems Resolved Problems Electronic Signature(s) Signed: 09/30/2019 4:47:05 PM By: Amber Keeler [name] Entered By: [name] on 09/30/2019 13:03:01 Moncrieffe, [name] -------------------------------------------------------------------------------- Progress Note/History and Physical Details Patient Name: SAMIRA, LOMBOY. Date of Service: 09/30/2019 12:45 PM Medical Record Number: [id number] Patient Account Number: [id number] Date of Birth/Sex: 09/13/1946 (73 y.o. F) Treating RN: [name] Primary Care Provider: Ria [name] Other Clinician: Referring Provider: Ria [name] Treating Provider/Extender: [name] Weeks in Treatment: 50 Subjective Chief Complaint Information obtained from Patient Left calf venous stasis ulceration. 11/13/16; the patient is here for a left calf recurrent ulceration which is painful and has been present for the last month 01/22/17;  the patient re-presents here for recurrent difficulties with blistering and leaking fluid on her left posterior calf 12/10/18; the patient returns to clinic for a wound on the left medial calf History of Present Illness (HPI) Pleasant 73 year old with history of chronic venous insufficiency. No diabetes or peripheral vascular disease. Left ABI 1.29. Questionable history of left lower extremity DVT. She developed a recurrent ulceration on her left lateral calf in December 2015, which she attributes to poor diet and subsequent lower extremity edema. She underwent endovenous laser ablation of her left greater saphenous vein in 2010. She underwent laser ablation of accessory branch of left GSV in April 2016 by Dr. Kellie Simmering at [facility]. She was previously wearing Unna boots, which she tolerated well. Tolerating 2 layer compression and cadexomer iodine. She returns to clinic for follow-up and is without new complaints. She denies any significant pain at this time. She reports persistent pain with pressure. No claudication or ischemic rest pain. No fever or chills. No drainage. READMISSION 11/13/16; this is a 73 year old woman who is not a diabetic. She is here for a review of a painful area on her left medial lower extremity. I note that she was seen here previously last year for wound I believe to [name] in the same area. At that time she had undergone previously a left greater saphenous vein ablation by Dr. Kellie Simmering and she had a ablation of the anterior accessory branch of the left greater saphenous vein in March 2016. Seeing that the wound actually closed over. In reviewing the history with her today the ulcer in this area has been recurrent. She describes a biopsy of this area in 2009 that only showed stasis physiology. She also has a history of today malignant melanoma in the right shoulder for which she follows with  Dr. 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 Do not see that she has ever had arterial studies in the left leg. She tells me she has a follow-up with Dr. Kellie Simmering in roughly 10 days In any case she developed the reopening of this area roughly a month ago. On the background of this she describes rapidly increasing edema which has responded to Lasix 40 mg and metolazone 2.5 mg as well as the patient's lymph massage. She has been told she has both venous insufficiency and lymphedema but she cannot tolerate compression stockings 11/28/16; the patient saw Dr. Kellie Simmering recently. Per the patient he did arterial Dopplers in the office that did not show evidence of arterial insufficiency, per the patient he stated "treat this like an ordinary venous ulcer". She also saw her dermatologist Dr. Ronnald Ramp who felt that this was more of a vascular ulcer. In general things are improving although she arrives today with increasing bilateral lower extremity edema with weeping a deeper fluid through the wound on the left medial leg compatible with some degree of lymphedema 12/04/16; the patient's wound is fully epithelialized but I don't think fully healed. We will do another week of depression with Promogran and TCA however I suspect we'll [name] able to discharge her next week. This is a very unusual-looking wound which was initially a figure-of-eight type wound lying on its side surrounded by petechial like hemorrhage. She has had venous ablation on this side. She apparently does not have an arterial issue per Dr. Kellie Simmering. She saw her dermatologist thought it was "vascular". Patient is definitely going to need ongoing compression and I talked about this with her today she will go to elastic therapy after she leaves here next week 12/11/16; the patient's wound is not completely closed today. She has surrounding scar tissue and in further discussion with the patient it would appear that she had ulcers  in this area in 2009 for a prolonged period of time ultimately requiring a punch biopsy of this area that only showed venous insufficiency. I did not previously pickup on this part of the history from the [place]. ([id number]) patient. 12/18/16; the patient's wound is completely epithelialized. There is no open area here. She has significant bilateral venous insufficiency with secondary lymphedema to a mild-to-moderate degree she does not have compression stockings.. She did not say anything to me when I was in the room, she told our intake nurse that she was still having pain in this area. This isn't unusual recurrent small open area. She is going to go to elastic therapy to obtain compression stockings. 12/25/16; the patient's wound is fully epithelialized. There is no open area here. The patient describes some continued episodic discomfort in this area medial left calf. However everything looks fine and healed here. She is been to elastic therapy and caught herself 15-20 mmHg stockings, they apparently were having trouble getting 20-30 mm stockings in her size 01/22/17; this is a patient we discharged from the clinic a month ago. She has a recurrent open wound on her medial left calf. She had 15 mm support stockings. I told her I thought she needed 20-30 mm compression stockings. She tells me that she has been ill with hospitalization secondary to asthma and is been found to have severe hypokalemia likely secondary to a combination of Lasix and metolazone. This morning she noted blistering and  leaking fluid on the posterior part of her left leg. She called our intake nurse urgently and we was saw her this afternoon. She has not had any real discomfort here. I don't know that she's been wearing any stockings on this leg for at least 2-3 days. ABIs in this clinic were 1.21 on the right and 1.3 on the left. She is previously seen vascular surgery who does not think that there is a peripheral  arterial issue. 01/30/17; Patient arrives with no open wound on the left leg. She has been to elastic therapy and obtained 20-26mmhg below knee stockings and she has one on the right leg today. READMISSION 02/19/18; this Valdiviezo is a now 73 year old patient we've had in this clinic perhaps 3 times before. I had last looked at her from January 07 December 2016 with an area on the medial left leg. We discharged her on 12/25/16 however she had to [name] readmitted on 01/22/17 with a recurrence. I have in my notes that we discharged her on 20-30 mm stockings although she tells me she was only wearing support hose because she cannot get stockings on predominantly related to her cervical spine surgery/issues. She has had previous ablations done by vein and vascular in [place] including a great saphenous vein ablation on the left with an anterior accessory branch ablation I think both of these were in 2016. On one of the previous visit she had a biopsy noted 2009 that was negative. She is not felt to have an arterial issue. She is not a diabetic. She does have a history of obstructive sleep apnea hypertension asthma as well as chronic venous insufficiency and lymphedema. On this occasion she noted 2 dry scaly patch on her left leg. She tried to put lotion on this it didn't really help. There were 2 open areas.the patient has been seeing her primary physician from 02/05/18 through 02/14/18. She had Unna boots applied. The superior wound now on the lateral left leg has closed but she's had one wound that remains open on the lateral left leg. This is not the same spot as we dealt with in 2018. ABIs in this clinic were 1.3 bilaterally 02/26/18; patient has a small wound on the left lateral calf. Dimensions are down. She has chronic venous insufficiency and lymphedema. 03/05/18; small open area on the left lateral calf. Dimensions are down. Tightly adherent necrotic debris over the surface of the wound which was  difficult to remove. Also the dressing [over collagen] stuck to the wound surface. This was removed with some difficulty as well. Change the primary dressing to Hydrofera Blue ready 03/12/18; small open area on the left lateral calf. Comes in with tightly adherent surface eschar as well as some adherent Hydrofera Blue. 03/19/18; open area on the left lateral calf. Again adherent surface eschar as well as some adherent Hydrofera Blue nonviable subcutaneous tissue. She complained of pain all week even with the reduction from 4-3 layer compression I put on last week. Also she had an increase in her ankle and calf measurements probably related to the same thing. 03/26/18; open area on the left lateral calf. A very small open area remains here. We used silver alginate starting last week as the Hydrofera Blue seem to stick to the wound bed. In using 4-layer compression 04/02/18; the open area in the left lateral calf at some adherent slough which I removed there is no open area here. We are able to transition her into her own compression stocking. Truthfully I  think this is probably his support hose. However this does not maintain skin integrity will [name] limited. She cannot put over the toe compression stockings on because of neck problems hand problems etc. She is allergic to the lining layer of juxta lites. We might [name] forced to use extremitease stocking should this fail READMIT 11/24/2018 Patient is now a 73 year old woman who is not a diabetic. She has been in this clinic on at least 3 previous occasions largely with recurrent wounds on her left leg secondary to chronic venous insufficiency with secondary lymphedema. Her situation is complicated by inability to get stockings on and an allergy to neoprene which is apparently a component and at least juxta lites and other stockings. As a result she really has not been wearing any stockings on her legs. MONIYAH, CANNEY ([id number]) She tells [place] that roughly  2 or 3 weeks ago she started noticing a stinging sensation just above her ankle on the left medial aspect. She has been diagnosed with pseudogout and she wondered whether this was what she was experiencing. She tried to dress this with something she bought at the store however subsequently it pulled skin off and now she has an open wound that is not improving. She has been using Vaseline gauze with a cover bandage. She saw her primary doctor last week who put an Haematologist on her. ABIs in this clinic was 1.03 on the left 2/12; the area is on the left medial ankle. Odd-looking wound with what looks to [name] surface epithelialization but a multitude of small petechial openings. This clearly not closed yet. We have been using silver alginate under 3 layer compression with TCA 2/19; the wound area did not look quite as good this week. Necrotic debris over the majority of the wound surface which required debridement. She continues to have a multitude of what looked to [name] small petechial openings. She reminds [place] that she had a biopsy on this initially during her first outbreak in 2015 in [place] dermatology. She expresses concern about this being a possible melanoma. 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 I assured her that this does not look anything like melanoma. Except for the petechial reaction it does look like a venous insufficiency area and she certainly has evidence of this on both sides 2/26; a difficult area on the left medial ankle. The patient clearly has chronic venous hypertension with some degree of lymphedema. The odd thing about the area is the small petechial hemorrhages. I am not really sure how to explain this. This was present last time and this is not a compression injury. We have been using Hydrofera Blue which I changed to last week 3/4; still using Hydrofera Blue. Aggressive debridement  today. She does not have known arterial issues. She has seen Dr. Kellie Simmering at [facility] vein and vascular and and has an ablation on the left. [Anterior accessory branch of the greater saphenous]. From what I remember they did not feel she had an arterial issue. The patient has had this area biopsied in 2009 at [facility] dermatology and by her recollection they said this was "stasis". She is also follow-up with dermatology locally who thought that this was more of a vascular issue 3/11; using Hydrofera Blue. Aggressive debridement today. She does not have an arterial issue. We are using 3 layer compression although we may need to go to 4. The patient has been in for  multiple changes to her wrap since I last saw her a week ago. She says that the area was leaking. I do not have too much more information on what was found 01/19/19 on evaluation today patient was actually being seen for a nurse visit when unfortunately she had the area on her left lateral lower extremity as well as weeping from the right lower extremity that became apparent. Therefore we did end up actually seeing her for a full visit with myself. She is having some pain at this site as well but fortunately nothing too significant at this point. No fevers, chills, nausea, or vomiting noted at this time. 3/18-Patient is back to the clinic with the left leg venous leg ulcer, the ulcer is larger in size, has a surface that is densely adherent with fibrinous tissue, the Hydrofera Blue was used but is densely adherent and there was difficulty in removing it. The right lower extremity was also wrapped for weeping edema. Patient has a new area over the left lateral foot above the malleolus that is small and appears to have no debris with intact surrounding skin. Patient is on increased dose of Lasix also as a means to edema management 3/25; the patient has a nonhealing venous ulcer on the medial left leg and last week developed a smaller area on  the lateral left calf. We have been using Hydrofera Blue with a contact layer. 4/1; no major change in these wounds areas. Left medial and more recently left lateral calf. I tried Iodoflex last week to aid in debridement she did not tolerate this. She stated her pain was terrible all week. She took the top layer of the 4 layer compression off. 4/8; the patient actually looks somewhat better in terms of her more prominent left lateral calf wound. There is some healthy looking tissue here. She is still complaining of a lot of discomfort. 4/15; patient in a lot of pain secondary to sciatica. She is on a prednisone taper prescribed by her primary physician. She has the 2 areas one on the left medial and more recently a smaller area on the left lateral calf. Both of these just above the malleoli 4/22; her back pain is better but she still states she is very uncomfortable and now feels she is intolerant to the [employer]. No real change in the wounds we have been using Sorbact. She has been previously intolerant to Iodoflex. There is not a lot of option about what we can use to debride this wound under compression that she no doubt needs. sHe states Ultram no longer works for her pain 4/29; no major change in the wounds slightly increased depth. Surface on the original medial wound perhaps somewhat improved however the more recent area on the lateral left ankle is 100% covered in very adherent debris we have been using Sorbact. She tolerates 4 layer compression well and her edema control is a lot better. She has not had to come in for a nurse check 5/6; no major change in the condition of the wounds. She did consent to debridement today which was done with some difficulty. Continuing Sorbact. She did not tolerate Iodoflex. She was in for a check of her compression the day after we wrapped her last week this was adjusted but nothing much was found 5/13; no major change in the condition or area of the  wounds. I was able to get a fairly aggressive debridement done on the lateral left leg wound. Even using Sorbact under  compression. She came back in on Friday to have the wrap changed. She NAAVA, STRUBHAR ([id number]) says she felt uncomfortable on the lateral aspect of her ankle. She has a long history of chronic venous insufficiency including previous ablation surgery on this side. 5/20-Patient returns for wounds on left leg with both wounds covered in slough, with the lateral leg wound larger in size, she has been in 3 layer compression and felt more comfortable, she describes pain in ankle, in leg and pins and needles in foot, and is about to try Pamelor for this 6/3; wounds on the left lateral and left medial leg. The area medially which is the most recent of the 2 seems to have had the largest increase in dimensions. We have been using Sorbac to try and debride the surface. She has been to see orthopedics they apparently did a plain x-ray that was indeterminant. Diagnosed her with neuropathy and they have ordered an MRI to determine if there is underlying osteomyelitis. This was not high on my thought list but I suppose it is prudent. We have advised her to make an appointment with vein and vascular in [name]. She has a history of a left greater saphenous and accessory vein ablations I wonder if there is anything else that can [name] done from a surgical point of view to help in these difficult refractory wounds. We have previously healed this wound on one occasion but it keeps on reopening [medial side] 6/10; deep tissue culture I did last week I think on the left medial wound showed both moderate E. coli and moderate staph aureus [MSSA]. She is going to require antibiotics and I have chosen Augmentin. We have been using Sorbact and we have made better looking wound surface on both sides but certainly no improvement in wound area. She was back in last Friday apparently for a dressing changes  the wrap was hurting her outer left ankle. She has not managed to get a hold of vein and vascular in [place]. We are going to have to make her that appointment 6/17; patient is tolerating the Augmentin. She had an MRI that I think was ordered by orthopedic surgeon this did not show osteomyelitis or an abscess did suggest cellulitis. We have been using Sorbact to the lateral and medial ankles. We have been trying to arrange a follow-up appointment with vein and vascular in [place] or did her original ablations. We apparently an area sent the request to vein and vascular in [facility] 6/24; patient has completed the Augmentin. We do not yet have a vein and vascular appointment in [place]. I am not sure what the issue is here we have asked her to call tomorrow. We are using Sorbact. Making some improvements and especially the medial wound. Both surfaces however look better medial and lateral. 7/1; the patient has been in contact with vein and vascular in [place] but has not yet received an appointment. Using Sorbact we have gradually improve the wound surface with no improvement in surface area. She is approved for Apligraf but the wound surface still is not completely viable. She has not had to come in for a dressing change 7/8; the patient has an appointment with vein and vascular on 7/31 which is a Friday afternoon. She is concerned about getting back here for [place] to dress her wounds. I think it is important to have them goal for her venous reflux/history of ablations etc. to see if anything else can [name] done. She apparently tested positive for  1 of the blood tests with regards to lupus and saw a rheumatologist. He has raised the issue of vasculitis again. I have had this thought in the past however the evidence seems overwhelming that this is a venous reflux etiology. If the rheumatologist tells me there is clinical and laboratory investigation is positive for lupus I will rethink  this. 7/15; the patient's wound surfaces are quite a bit better. The medial area which was her original wound now has no depth although the lateral wound which was the more recent area actually appears larger. Both with viable surfaces which is indeed better. Using Sorbact. I wanted to use Apligraf on her however there is the issue of the vein and vascular appointment on 7/31 at 2:00 in the afternoon which would not allow her to get back to [name] rewrapped and they would no doubt remove the graft 7/22; the patient's wound surfaces have moderate amount of debris although generally look better. The lateral one is larger with 2 small satellite areas superiorly. We are waiting for her vein and vascular appointment on 7/31. She has been approved for Apligraf which I would like to use after th 7/29; wound surfaces have improved no debridement is required we have been using Sorbact. She sees vein and vascular on Friday with this so question of whether anything can [name] done to lessen the likelihood of recurrence and/or speed the healing of these areas. She is already had previous ablations. She no doubt has severe venous hypertension 8/5-Patient returns at 1 week, she was in [place] for 3 days by her podiatrist, we have been using so backed to the wound, she has increased pain in both the wounds on the left lower leg especially the more distal one on the lateral aspect 8/12-Patient returns at 1 week and she is agreeable to having debridement in both wounds on her left leg today. We have been using Sorbact, and vascular studies were reviewed at last visit 8/19; the patient arrives with her wounds fairly clean and no debridement is required. We have used Sorbact which is really done a nice job in cleaning up these very difficult wound surfaces. The patient saw Dr. Donzetta Matters of vascular surgery on 7/31. He did not feel that there was an arterial component. He felt that her treated greater saphenous vein is adequately  addressed and that the small saphenous vein did not appear to [name] involved significantly. She was also noted to have deep venous reflux which is not treatable. Dr. Donzetta Matters mentioned the possibility of a central obstructive component leading to reflux and he offered her central venography. She wanted to discuss this or think about it. I have urged her to go ahead with this. She has had recurrent difficult wounds in these areas which do heal but after months in the clinic. If there is anything that can [name] done to reduce the likelihood of this I think it is Amber it. 9/2 she is still working towards getting follow-up with Dr. Donzetta Matters to schedule her CT. Things are quite a bit worse venography. I put Apligraf on 2 weeks ago on both wounds on the medial and lateral part of her left lower leg. She arrives in clinic today with 3 superficial additional wounds above the area laterally and one below the wound medially. She describes a lot of discomfort. MURIAH, SAEFONG ([id number]) I think these are probably wrapped injuries. Does not look like she has cellulitis. 07/20/2019 on evaluation today patient appears to [name] doing somewhat  poorly in regard to her lower extremity ulcers. She in fact showed signs of erythema in fact we may even [name] dealing with an infection at this time. Unfortunately I am unsure if this is just infection or if indeed there may [name] some allergic reaction that occurred as a result of the Apligraf application. With that being said that would [name] unusual but nonetheless not impossible in this patient is one who is unfortunately allergic to quite a bit. Currently we have been using the Sorbact which seems to do as well as anything for her. I do think we may want to obtain a culture today to see if there is anything showing up there that may need to [name] addressed. 9/16; noted that last week the wounds look worse in 1 week follow-up of the Apligraf. Using Sorbact as of 2 days ago. She arrives with  copious amounts of drainage and new skin breakdown on the back of the left calf. The wounds arm more substantial bilaterally. There is a fair amount of swelling in the left calf no overt DVT there is edema present I think in the left greater than right thigh. She is supposed to go on 9/28 for CT venography. The wounds on the medial and lateral calf are worse and she has new skin breakdown posteriorly at least new for me. This is almost developing into a circumferential wound area The Apligraf was taken off last week which I agree with things are not going in the right direction a culture was done we do not have that back yet. She is on Augmentin that she started 2 days ago 9/23; dressing was changed by her nurses on Monday. In general there is no improvement in the wound areas although the area looks less angry than last week. She did get Augmentin for MSSA cultured on the 14th. She still appears to have too much swelling in the left leg even with 3 layer compression 9/30; the patient underwent her procedure on 9/28 by Dr. Donzetta Matters at vascular and vein specialist. She was discovered to have the common iliac vein measuring 12.2 mm but at the level of L4-L5 measured 3 mm. After stenting it measured 10 mm. 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 Rouleaux flow in the common femoral and femoral vein was observed much improved after stenting. We are using silver alginate to the wounds on the medial and lateral ankle on the left. 4 layer compression 10/7; the patient had fluid swelling around her knee and 4 layer compression. At the advice of vein and vascular this was reduced to 3 layer which she is tolerating better. We have been using silver alginate under 3 layer compression since last Friday 10/14; arrives with the areas on the left ankle looking a lot better. Inflammation in the area also a lot better. She came in for a nurse check on 10/9 10/21; continued nice improvement. Slight  improvements in surface area of both the medial and lateral wounds on the left. A lot of the satellite lesions in the weeping erythema around these from stasis dermatitis is resolved. We have been using silver alginate 10/28; general improvement in the entire wound areas although not a lot of change in dimensions the wound certainly looks better. There is a lot less in terms of venous inflammation. Continue silver alginate this week however look towards Hydrofera Blue next week 11/4; very adherent debris on the medial wound left wound is not as bad. We have been using  silver alginate. Change to [facility] today 11/11; very adherent debris on both wound areas. She went to vein and vascular last week and follow-up they put in [place] boot on this today. He says the [facility] was adherent. Wound is definitely not as good as last week. Especially on the left there the satellite lesions look more prominent 11/18; absolutely no better. erythema on lateral aspect with tenderness. 09/30/2019 on evaluation today patient appears to actually [name] doing better. Dr. Dellia Nims did put her on doxycycline last week which I do believe has helped her at this point. Fortunately there is no signs of active infection at this time. No fevers, chills, nausea, vomiting, or diarrhea. I do believe he may want extend the doxycycline for 7 additional days just to ensure everything does completely cleared up the patient is in agreement with that plan. Otherwise she is going require some sharp debridement today Patient History Information obtained from Patient. Family History Cancer - Mother,Paternal Grandparents, Hypertension - Mother,Father, Kidney Disease - Paternal Grandparents, Lung Disease - Paternal Grandparents, No family history of Diabetes, Heart Disease, Hereditary Spherocytosis, Seizures, Stroke, Thyroid Problems, Tuberculosis. TALITA, MATIN ([id number]) Social History Former smoker - quit at age 71,  Marital Status - Divorced, Alcohol Use - Never, Drug Use - No History, Caffeine Use - Daily. Medical History Eyes Patient has history of Cataracts Denies history of Glaucoma, Optic Neuritis Ear/Nose/Mouth/Throat Denies history of Chronic sinus problems/congestion, Middle ear problems Hematologic/Lymphatic Denies history of Anemia, Hemophilia, Human Immunodeficiency Virus, Lymphedema, Sickle Cell Disease Respiratory Patient has history of Asthma, Sleep Apnea Denies history of Aspiration, Chronic Obstructive Pulmonary Disease (COPD), Pneumothorax, Tuberculosis Cardiovascular Patient has history of Deep Vein Thrombosis - LLE 2010, Hypertension, Peripheral Venous Disease Denies history of Angina, Arrhythmia, Congestive Heart Failure, Coronary Artery Disease, Hypotension, Myocardial Infarction, Peripheral Arterial Disease, Phlebitis, Vasculitis Gastrointestinal Denies history of Cirrhosis , Colitis, Crohn s, Hepatitis A, Hepatitis B, Hepatitis C Endocrine Denies history of Type I Diabetes, Type II Diabetes Genitourinary Denies history of End Stage Renal Disease Immunological Denies history of Lupus Erythematosus, Raynaud s, Scleroderma Integumentary (Skin) Denies history of History of Burn, History of pressure wounds Musculoskeletal Patient has history of Osteoarthritis - neck Denies history of Gout, Rheumatoid Arthritis, Osteomyelitis Neurologic Denies history of Dementia, Neuropathy, Quadriplegia, Paraplegia, Seizure Disorder Oncologic Patient has history of Received Chemotherapy - interfeon immunotherapy, Received Radiation Psychiatric Denies history of Anorexia/bulimia, Confinement Anxiety Hospitalization/Surgery History - Melanoma sx. Medical And Surgical History Notes Constitutional Symptoms (General Health) Vein ablation LLE 2010 Vascular Sx ( vein ablationo) LLE 02/2015 Eyes Horners syndrome Genitourinary Kidney stones Neurologic ct scan showed swollen lymph  nodes Oncologic Melanoma (R) shoulder 07/2010; (R) axillary lymphnode removal Review of Systems (ROS) Constitutional Symptoms (General Health) Denies complaints or symptoms of Fatigue, Fever, Chills, Marked Weight Change. Respiratory Denies complaints or symptoms of Chronic or frequent coughs, Shortness of Breath. Cardiovascular Complains or has symptoms of LE edema. Denies complaints or symptoms of Chest pain. KEYASHIA, SINATRA ([id number]) Psychiatric Denies complaints or symptoms of Anxiety, Claustrophobia. Objective Constitutional [name] and well-hydrated in no acute distress. Vitals Time Taken: 1:00 PM, Height: 63 in, Weight: 224.7 lbs, BMI: 39.8, Temperature: 98.7 F, Pulse: 85 bpm, Respiratory Rate: 18 breaths/min, Blood Pressure: 155/78 mmHg. Respiratory normal breathing without difficulty. clear to auscultation bilaterally. Cardiovascular regular rate and rhythm with normal S1, S2. Psychiatric this patient is able to make decisions and demonstrates good insight into disease process. Alert and Oriented x 3. pleasant and cooperative. General Notes:  I did actually perform sharp debridement over the patient's wounds today at all locations in order to clear away slough and necrotic tissue as well as biofilm down to good subcutaneous tissue which she tolerated without complication and post debridement the wound bed appears to [name] doing significantly better which is great news. Overall I am very pleased with how things seem to [name] progressing. Integumentary (Hair, Skin) Wound #5 status is Open. Original cause of wound was Gradually Appeared. The wound is located on the Left,Medial Lower Leg. The wound measures 2.7cm length x 2.6cm width x 0.2cm depth; 5.513cm^2 area and 1.103cm^3 volume. There is Fat Layer (Subcutaneous Tissue) Exposed exposed. There is no tunneling or undermining noted. There is a large amount of serosanguineous drainage noted. The wound margin is flat and  intact. There is medium (34-66%) pale granulation within the wound bed. There is a medium (34-66%) amount of necrotic tissue within the wound bed including Adherent Slough. Wound #6 status is Open. Original cause of wound was Gradually Appeared. The wound is located on the Left,Lateral Lower Leg. The wound measures 5.3cm length x 2.5cm width x 0.2cm depth; 10.407cm^2 area and 2.081cm^3 volume. There is Fat Layer (Subcutaneous Tissue) Exposed exposed. There is no tunneling or undermining noted. There is a large amount of serosanguineous drainage noted. The wound margin is flat and intact. There is medium (34-66%) pink granulation within the wound bed. There is a medium (34-66%) amount of necrotic tissue within the wound bed including Adherent Slough. Wound #9 status is Open. Original cause of wound was Gradually Appeared. The wound is located on the Left,Lateral,Posterior Lower Leg. The wound measures 1.2cm length x 1.1cm width x 0.1cm depth; 1.037cm^2 area and 0.104cm^3 volume. There is Fat Layer (Subcutaneous Tissue) Exposed exposed. There is no tunneling or undermining noted. There is a large amount of serosanguineous drainage noted. The wound margin is flat and intact. There is medium (34-66%) pink granulation within the wound bed. There is a medium (34-66%) amount of necrotic tissue within the wound bed including Adherent Slough. HADY, MCFALLS ([id number]) Assessment Active Problems ICD-10 Non-pressure chronic ulcer of left calf limited to breakdown of skin Chronic venous hypertension (idiopathic) with inflammation of right lower extremity Lymphedema, not elsewhere classified Procedures Wound #5 Pre-procedure diagnosis of Wound #5 is a Lymphedema located on the Left,Medial Lower Leg . There was a Excisional Skin/Subcutaneous Tissue Debridement with a total area of 7.02 sq cm performed by Amber III, [name] E., [name]. With the following instrument(s): Curette to remove Viable and Non-Viable  tissue/material. Material removed includes Subcutaneous Tissue and Slough and after achieving pain control using Lidocaine. No specimens were taken. A time out was conducted at 13:29, prior to the start of the procedure. A Minimum amount of bleeding was controlled with Pressure. The procedure was tolerated well. Post Debridement Measurements: 2.7cm length x 2.6cm width x 0.2cm depth; 1.103cm^3 volume. Character of Wound/Ulcer Post Debridement is stable. Post procedure Diagnosis Wound #5: Same as Pre-Procedure Wound #6 Pre-procedure diagnosis of Wound #6 is a Venous Leg Ulcer located on the Left,Lateral Lower Leg .Severity of Tissue Pre Debridement is: Fat layer exposed. There was a Excisional Skin/Subcutaneous Tissue Debridement with a total area of 13.25 sq cm performed by Amber III, [name] E., [name]. With the following instrument(s): Curette to remove Viable and Non-Viable tissue/material. Material removed includes Subcutaneous Tissue and Slough and after achieving pain control using Lidocaine. No specimens were taken. A time out was conducted at 13:29, prior to the start of  the procedure. A Minimum amount of bleeding was controlled with Pressure. The procedure was tolerated well. Post Debridement Measurements: 5.3cm length x 2.5cm width x 0.3cm depth; 3.122cm^3 volume. Character of Wound/Ulcer Post Debridement is stable. Severity of Tissue Post Debridement is: Fat layer exposed. Post procedure Diagnosis Wound #6: Same as Pre-Procedure Plan Wound Cleansing: Wound #5 Left,Medial Lower Leg: Cleanse wound with mild soap and water Wound #6 Left,Lateral Lower Leg: Cleanse wound with mild soap and water Wound #9 Left,Lateral,Posterior Lower Leg: Cleanse wound with mild soap and water Anesthetic (add to Medication List): Wound #5 Left,Medial Lower Leg: Topical Lidocaine 4% cream applied to wound bed prior to debridement (In Clinic Only). Wound #6 Left,Lateral Lower Leg: Topical Lidocaine 4% cream  applied to wound bed prior to debridement (In Clinic Only). Wound #9 Left,Lateral,Posterior Lower Leg: KARRA, ASCHOFF ([id number]) Topical Lidocaine 4% cream applied to wound bed prior to debridement (In Clinic Only). Skin Barriers/Peri-Wound Care: Wound #5 Left,Medial Lower Leg: Triamcinolone Acetonide Ointment (TCA) Wound #6 Left,Lateral Lower Leg: Triamcinolone Acetonide Ointment (TCA) Wound #9 Left,Lateral,Posterior Lower Leg: Triamcinolone Acetonide Ointment (TCA) Primary Wound Dressing: Wound #5 Left,Medial Lower Leg: Silver Alginate - adaptic for sticking Wound #6 Left,Lateral Lower Leg: Silver Alginate - adaptic for sticking Wound #9 Left,Lateral,Posterior Lower Leg: Silver Alginate - adaptic for sticking Secondary Dressing: Wound #5 Left,Medial Lower Leg: Drawtex Wound #6 Left,Lateral Lower Leg: Drawtex Wound #9 Left,Lateral,Posterior Lower Leg: Drawtex Dressing Change Frequency: Wound #5 Left,Medial Lower Leg: Change dressing every week Other: - as needed. Wound #6 Left,Lateral Lower Leg: Change dressing every week Other: - as needed. Wound #9 Left,Lateral,Posterior Lower Leg: Change dressing every week Other: - as needed. Follow-up Appointments: Wound #5 Left,Medial Lower Leg: Return Appointment in 1 week. Nurse Visit as needed - Call of needed Wound #6 Left,Lateral Lower Leg: Return Appointment in 1 week. Nurse Visit as needed - Call of needed Wound #9 Left,Lateral,Posterior Lower Leg: Return Appointment in 1 week. Nurse Visit as needed - Call of needed Edema Control: Wound #5 Left,Medial Lower Leg: 3 Layer Compression System - Left Lower Extremity - unna to anchor Wound #6 Left,Lateral Lower Leg: 3 Layer Compression System - Left Lower Extremity - unna to anchor Wound #9 Left,Lateral,Posterior Lower Leg: 3 Layer Compression System - Left Lower Extremity - unna to anchor Medications-please add to medication list.: Wound #5 Left,Medial Lower Leg: P.O.  Antibiotics - Continue antibiotics Wound #6 Left,Lateral Lower Leg: P.O. Antibiotics - Continue antibiotics Wound #9 Left,Lateral,Posterior Lower Leg: P.O. Antibiotics - Continue antibiotics The following medication(s) was prescribed: doxycycline hyclate oral 100 mg capsule 1 1 capsule oral taken 2 times a day for 7 days to extend the current regimen starting 09/30/2019 KATLYNNE, GRANNEMAN ([id number]) 1 my suggestion currently is good to [name] that we go ahead and initiate treatment with the continuation of the silver alginate dressing that seems to have been beneficial for her. 2. I am going to extend her doxycycline for 7 additional days for a total of 14 days in order to ensure everything continues to do well in that regard overall I feel like she has done well and I am hopeful just to keep things moving in the right direction. 3. I recommend as well that we continue with her 3 layer compression wrap which seems to [name] doing well for her and the patient is in agreement with that plan. We will see patient back for reevaluation in 1 week here in the clinic. If anything worsens or changes patient will  contact our office for additional recommendations. Electronic Signature(s) Signed: 09/30/2019 4:47:05 PM By: Amber Keeler [name] Entered By: [name] on 09/30/2019 13:36:41 Gherardi, [name] -------------------------------------------------------------------------------- ROS/PFSH Details Patient Name: RAINA, LOEFFLER. Date of Service: 09/30/2019 12:45 PM Medical Record Number: [id number] Patient Account Number: [id number] Date of Birth/Sex: November 27, 1945 (73 y.o. F) Treating RN: [name] Primary Care Provider: Ria [name] Other Clinician: Referring Provider: Ria [name] Treating Provider/Extender: [name] Weeks in Treatment: 42 Label Progress Note Print Version as History and Physical for this encounter Information Obtained From Patient Constitutional  Symptoms (General Health) Complaints and Symptoms: Negative for: Fatigue; Fever; Chills; Marked Weight Change Medical History: Past Medical History Notes: Vein ablation LLE 2010 Vascular Sx ( vein ablationo) LLE 02/2015 Respiratory Complaints and Symptoms: Negative for: Chronic or frequent coughs; Shortness of Breath Medical History: Positive for: Asthma; Sleep Apnea Negative for: Aspiration; Chronic Obstructive Pulmonary Disease (COPD); Pneumothorax; Tuberculosis Cardiovascular Complaints and Symptoms: Positive for: LE edema Negative for: Chest pain Medical History: Positive for: Deep Vein Thrombosis - LLE 2010; Hypertension; Peripheral Venous Disease Negative for: Angina; Arrhythmia; Congestive Heart Failure; Coronary Artery Disease; Hypotension; Myocardial Infarction; Peripheral Arterial Disease; Phlebitis; Vasculitis Psychiatric Complaints and Symptoms: Negative for: Anxiety; Claustrophobia Medical History: Negative for: Anorexia/bulimia; Confinement Anxiety Eyes Medical History: Positive for: Cataracts Negative for: Glaucoma; Optic Neuritis Past Medical History Notes: Horners syndrome SHANAIYA, PALAZZI ([id number]) Ear/Nose/Mouth/Throat Medical History: Negative for: Chronic sinus problems/congestion; Middle ear problems Hematologic/Lymphatic Medical History: Negative for: Anemia; Hemophilia; Human Immunodeficiency Virus; Lymphedema; Sickle Cell Disease Gastrointestinal Medical History: Negative for: Cirrhosis ; Colitis; Crohnos; Hepatitis A; Hepatitis B; Hepatitis C Endocrine Medical History: Negative for: Type I Diabetes; Type II Diabetes Genitourinary Medical History: Negative for: End Stage Renal Disease Past Medical History Notes: Kidney stones Immunological Medical History: Negative for: Lupus Erythematosus; Raynaudos; Scleroderma Integumentary (Skin) Medical History: Negative for: History of Burn; History of pressure wounds Musculoskeletal Medical  History: Positive for: Osteoarthritis - neck Negative for: Gout; Rheumatoid Arthritis; Osteomyelitis Neurologic Medical History: Negative for: Dementia; Neuropathy; Quadriplegia; Paraplegia; Seizure Disorder Past Medical History Notes: ct scan showed swollen lymph nodes Oncologic Medical History: Positive for: Received Chemotherapy - interfeon immunotherapy; Received Radiation Past Medical History Notes: Melanoma (R) shoulder 07/2010; (R) axillary lymphnode removal HBO Extended History Items Eyes: Cataracts ATTALIE, SANGUINETTI ([id number]) Immunizations Pneumococcal Vaccine: Received Pneumococcal Vaccination: Yes Immunization Notes: tetanus shot w/in the last 5 years per pt Implantable Devices No devices added Hospitalization / Surgery History Type of Hospitalization/Surgery Melanoma sx Family and Social History Cancer: Yes - Mother,Paternal Grandparents; Diabetes: No; Heart Disease: No; Hereditary Spherocytosis: No; Hypertension: Yes - Mother,Father; Kidney Disease: Yes - Paternal Grandparents; Lung Disease: Yes - Paternal Grandparents; Seizures: No; Stroke: No; Thyroid Problems: No; Tuberculosis: No; Former smoker - quit at age 74; Marital Status - Divorced; Alcohol Use: Never; Drug Use: No History; Caffeine Use: Daily; Financial Concerns: No; Food, Clothing or Shelter Needs: No; Support System Lacking: No; Transportation Concerns: No Physician Affirmation I have reviewed and agree with the above information. Electronic Signature(s) Signed: 09/30/2019 4:47:05 PM By: Amber Keeler [name] Signed: 09/30/2019 5:12:33 PM By: [name], [name], RN, CWS, [name] Entered By: [name] on 09/30/2019 13:34:33 Topp, [name] -------------------------------------------------------------------------------- SuperBill Details Patient Name: KACIA, STREIFEL. Date of Service: 09/30/2019 Medical Record Number: [id number] Patient Account Number: [id number] Date of Birth/Sex:  [birth year] (73 y.o. F) Treating RN: [name] Primary Care Provider: Ria [name] Other Clinician: Referring Provider: Ria [name] Treating  Provider/Extender: [name] Weeks in Treatment: 42 Diagnosis Coding ICD-10 Codes Code Description [id number] Non-pressure chronic ulcer of left calf limited to breakdown of skin I87.321 Chronic venous hypertension (idiopathic) with inflammation of right lower extremity I89.0 Lymphedema, not elsewhere classified Facility Procedures CPT4 Code: [id number] Description: [id number] - DEB SUBQ TISSUE 20 SQ CM/< ICD-10 Diagnosis Description L97.221 Non-pressure chronic ulcer of left calf limited to breakdown Modifier: of skin Quantity: 1 CPT4 Code: [id number] Description: [id number] - DEB SUBQ TISS EA ADDL 20CM ICD-10 Diagnosis Description L97.221 Non-pressure chronic ulcer of left calf limited to breakdown Modifier: of skin Quantity: 1 Physician Procedures CPT4 Code Description: [id number] - WC PHYS LEVEL 4 - EST PT ICD-10 Diagnosis Description L97.221 Non-pressure chronic ulcer of left calf limited to breakdown of I87.321 Chronic venous hypertension (idiopathic) with inflammation of ri I89.0  Lymphedema, not elsewhere classified Modifier: 25 skin ght lower extr Quantity: 1 emity CPT4 Code Description: [id number] - WC PHYS SUBQ TISS 20 SQ CM ICD-10 Diagnosis Description L97.221 Non-pressure chronic ulcer of left calf limited to breakdown of Modifier: skin Quantity: 1 CPT4 Code Description: [id number] - WC PHYS SUBQ TISS EA ADDL 20 CM ICD-10 Diagnosis Description L97.221 Non-pressure chronic ulcer of left calf limited to breakdown of Modifier: skin Quantity: 1 Electronic Signature(s) Signed: 09/30/2019 4:47:05 PM By: Amber Keeler [name] Entered By: [name] on 09/30/2019 13:37:05

## 2019-10-01 NOTE — Progress Notes (Signed)
[name] Visit Report for 09/30/2019 Arrival Information Details Patient Name: [name], [name]. Date of Service: 09/30/2019 12:45 PM Medical Record Number: [id number] Patient Account Number: [id number] Date of Birth/Sex: 06-Apr-1946 (73 y.o. F) Treating RN: Harold Barban Primary Care Hatsuko Bizzarro: [name] Other Clinician: Referring Hope Holst: [name] Treating Frances Joynt/Extender: Melburn Hake, HOYT Weeks in Treatment: 68 Visit Information History Since Last Visit Added or deleted any medications: No Patient Arrived: Ambulatory Any new allergies or adverse reactions: No Arrival Time: 12:58 Had a fall or experienced change in No Accompanied By: self activities of daily living that may affect Transfer Assistance: None risk of falls: Patient Identification Verified: Yes Signs or symptoms of abuse/neglect since last visito No Secondary Verification Process Yes Hospitalized since last visit: No Completed: Has Dressing in Place as Prescribed: Yes Patient Requires Transmission-Based No Has Compression in Place as Prescribed: Yes Precautions: Pain Present Now: No Patient Has Alerts: Yes Patient Alerts: Patient on Blood Thinner aspirin 81 Electronic Signature(s) Signed: 09/30/2019 4:07:24 PM By: Harold Barban Entered By: Harold Barban on 09/30/2019 13:00:03 [name] -------------------------------------------------------------------------------- Encounter Discharge Information Details Patient Name: [name], [name]. Date of Service: 09/30/2019 12:45 PM Medical Record Number: [id number] Patient Account Number: [id number] Date of Birth/Sex: 1946-01-20 (73 y.o. F) Treating RN: Cornell Barman Primary Care Tobias Avitabile: [name] Other Clinician: Referring Sinead Hockman: [name] Treating [place]/Extender: Melburn Hake, HOYT Weeks in Treatment: 42 Encounter Discharge Information Items Post Procedure Vitals Discharge Condition:  Stable Temperature (F): 98.7 Ambulatory Status: Ambulatory Pulse (bpm): 85 Discharge Destination: Home Respiratory Rate (breaths/min): 16 Transportation: Private Auto Blood Pressure (mmHg): 155/78 Accompanied By: self Schedule Follow-up Appointment: Yes Clinical Summary of Care: Electronic Signature(s) Signed: 09/30/2019 5:12:33 PM By: [name], [name], RN, CWS, [name], [name] Entered By: [name], [name], RN, CWS, Kim on 09/30/2019 13:35:17 [name] -------------------------------------------------------------------------------- Lower Extremity Assessment Details Patient Name: [name], [name]. Date of Service: 09/30/2019 12:45 PM Medical Record Number: [id number] Patient Account Number: [id number] Date of Birth/Sex: 02/12/1946 (73 y.o. F) Treating RN: Harold Barban Primary Care Macil Crady: [name] Other Clinician: Referring Briton Sellman: [name] Treating Alixandra Alfieri/Extender: STONE III, HOYT Weeks in Treatment: 42 Edema Assessment Assessed: [Left: No] [Right: No] Edema: [Left: Ye] [Right: s] Calf Left: Right: Point of Measurement: 31 cm From Medial Instep 46 cm cm Ankle Left: Right: Point of Measurement: 12 cm From Medial Instep 22.5 cm cm Vascular Assessment Pulses: Dorsalis Pedis Palpable: [Left:Yes] Electronic Signature(s) Signed: 09/30/2019 4:07:24 PM By: Harold Barban Entered By: Harold Barban on 09/30/2019 13:14:23 [name] -------------------------------------------------------------------------------- Multi Wound Chart Details Patient Name: [name], [name]. Date of Service: 09/30/2019 12:45 PM Medical Record Number: [id number] Patient Account Number: [id number] Date of Birth/Sex: 08/20/1946 (73 y.o. F) Treating RN: Cornell Barman Primary Care Patches Mcdonnell: [name] Other Clinician: Referring Jacyln Carmer: [name] Treating Antaeus Karel/Extender: Melburn Hake, HOYT Weeks in Treatment: 42 Vital Signs Height(in):  63 Pulse(bpm): 22 Weight(lbs): 224.7 Blood Pressure(mmHg): 155/78 Body Mass Index(BMI): 40 Temperature(F): 98.7 Respiratory Rate 18 (breaths/min): Photos: Wound Location: Left Lower Leg - Medial Left Lower Leg - Lateral Left Lower Leg - Lateral, Posterior Wounding Event: Gradually Appeared Gradually Appeared Gradually Appeared Primary Etiology: Lymphedema Venous Leg Ulcer Venous Leg Ulcer Comorbid History: Cataracts, Asthma, Sleep Cataracts, Asthma, Sleep Cataracts, Asthma, Sleep Apnea, Deep Vein Apnea, Deep Vein Apnea, Deep Vein Thrombosis, Hypertension, Thrombosis, Hypertension, Thrombosis, Hypertension, Peripheral Venous Disease, Peripheral Venous Disease, Peripheral Venous Disease, Osteoarthritis, Received Osteoarthritis, Received Osteoarthritis, Received Chemotherapy, Received Chemotherapy, Received Chemotherapy, Received Radiation Radiation  Radiation Date Acquired: 11/19/2018 01/19/2019 07/08/2019 Weeks of Treatment: 42 36 12 Wound Status: Open Open Open Measurements L x W x D 2.7x2.6x0.2 5.3x2.5x0.2 1.2x1.1x0.1 (cm) Area (cm) : 5.513 10.407 1.037 Volume (cm) : 1.103 2.081 0.104 % Reduction in Area: 35.50% -4630.50% -83.50% % Reduction in Volume: -29.00% -9359.10% -82.50% Classification: Full Thickness Without Full Thickness Without Full Thickness Without Exposed Support Structures Exposed Support Structures Exposed Support Structures Exudate Amount: Large Large Large Exudate Type: Serosanguineous Serosanguineous Serosanguineous Exudate Color: red, brown red, brown red, brown Wound Margin: Flat and Intact Flat and Intact Flat and Intact Granulation Amount: Medium (34-66%) Medium (34-66%) Medium (34-66%) Granulation Quality: Pale Pink Pink Necrotic Amount: Medium (34-66%) Medium (34-66%) Medium (34-66%) Tarquinio, [name] ([id number]) Exposed Structures: Fat Layer (Subcutaneous Fat Layer (Subcutaneous Fat Layer (Subcutaneous Tissue) Exposed: Yes Tissue) Exposed:  Yes Tissue) Exposed: Yes Fascia: No Fascia: No Fascia: No Tendon: No Tendon: No Tendon: No Muscle: No Muscle: No Muscle: No Joint: No Joint: No Joint: No Bone: No Bone: No Bone: No Epithelialization: None None None Treatment Notes Electronic Signature(s) Signed: 09/30/2019 5:12:33 PM By: [name], [name], RN, CWS, [name], [name] Entered By: [name], [name], RN, CWS, Kim on 09/30/2019 13:26:03 [name] -------------------------------------------------------------------------------- Multi-Disciplinary Care Plan Details Patient Name: [name], [name]. Date of Service: 09/30/2019 12:45 PM Medical Record Number: [id number] Patient Account Number: [id number] Date of Birth/Sex: 04-Sep-1946 (73 y.o. F) Treating RN: Cornell Barman Primary Care Manila Rommel: [name] Other Clinician: Referring Kiran Carline: [name] Treating Feliz Lincoln/Extender: Melburn Hake, HOYT Weeks in Treatment: 50 Active Inactive Orientation to the Wound Care Program Nursing Diagnoses: Knowledge deficit related to the wound healing center program Goals: Patient/caregiver will verbalize understanding of the [facility] Program Date Initiated: 12/10/2018 Target Resolution Date: 01/09/2019 Goal Status: Active Interventions: Provide education on orientation to the wound center Notes: Soft Tissue Infection Nursing Diagnoses: Impaired tissue integrity Goals: Patient's soft tissue infection will resolve Date Initiated: 12/10/2018 Target Resolution Date: 01/09/2019 Goal Status: Active Interventions: Assess signs and symptoms of infection every visit Notes: Venous Leg Ulcer Nursing Diagnoses: Actual venous Insuffiency (use after diagnosis is confirmed) Goals: Patient will maintain optimal edema control Date Initiated: 12/10/2018 Target Resolution Date: 01/09/2019 Goal Status: Active Interventions: Assess peripheral edema status every visit. MIRRIAM, KINGSBERRY ([id number]) Treatment  Activities: Therapeutic compression applied : 12/10/2018 Notes: Wound/Skin Impairment Nursing Diagnoses: Impaired tissue integrity Goals: Patient/caregiver will verbalize understanding of skin care regimen Date Initiated: 12/10/2018 Target Resolution Date: 01/09/2019 Goal Status: Active Interventions: Assess ulceration(s) every visit Treatment Activities: Topical wound management initiated : 12/10/2018 Notes: Electronic Signature(s) Signed: 09/30/2019 5:12:33 PM By: [name], [name], RN, CWS, [name], [name] Entered By: [name], [name], RN, CWS, Kim on 09/30/2019 13:25:52 [place], [name] -------------------------------------------------------------------------------- Pain Assessment Details Patient Name: [name], [name]. Date of Service: 09/30/2019 12:45 PM Medical Record Number: [id number] Patient Account Number: [id number] Date of Birth/Sex: 03-Nov-1946 (73 y.o. F) Treating RN: Harold Barban Primary Care Keiera Strathman: [name] Other Clinician: Referring Maylie Ashton: [name] Treating Bookert Guzzi/Extender: Melburn Hake, HOYT Weeks in Treatment: 42 Active Problems Location of Pain Severity and Description of Pain Patient Has Paino Yes Site Locations Pain Management and Medication Current Pain Management: Electronic Signature(s) Signed: 09/30/2019 4:07:24 PM By: Harold Barban Entered By: Harold Barban on 09/30/2019 13:01:07 [name] -------------------------------------------------------------------------------- Patient/Caregiver Education Details Patient Name: SHADAYA, GRABLE. Date of Service: 09/30/2019 12:45 PM Medical Record Number: [id number] Patient Account Number: [id number] Date of Birth/Gender: [birth year] (73 y.o. F) Treating RN: Cornell Barman  Primary Care Physician: [name] Other Clinician: Referring Physician: [name] Treating Physician/Extender: Sharalyn Ink in Treatment: 78 Education Assessment Education Provided  To: Patient Education Topics Provided Wound/Skin Impairment: Handouts: Caring for Your Ulcer Methods: Demonstration, Explain/Verbal Responses: State content correctly Electronic Signature(s) Signed: 09/30/2019 5:12:33 PM By: [name], [name], RN, CWS, [name], [name] Entered By: [name], [name], RN, CWS, Kim on 09/30/2019 13:34:06 [name] -------------------------------------------------------------------------------- Wound Assessment Details Patient Name: [name], [name]. Date of Service: 09/30/2019 12:45 PM Medical Record Number: [id number] Patient Account Number: [id number] Date of Birth/Sex: 10-10-1946 (73 y.o. F) Treating RN: Harold Barban Primary Care Randy Castrejon: [name] Other Clinician: Referring Shanquita Ronning: [name] Treating Kaniel Kiang/Extender: STONE III, HOYT Weeks in Treatment: 42 Wound Status Wound Number: 5 Primary Lymphedema Etiology: Wound Location: Left Lower Leg - Medial Wound Open Wounding Event: Gradually Appeared Status: Date Acquired: 11/19/2018 Comorbid Cataracts, Asthma, Sleep Apnea, Deep Vein Weeks Of Treatment: 42 History: Thrombosis, Hypertension, Peripheral Venous Clustered Wound: No Disease, Osteoarthritis, Received Chemotherapy, Received Radiation Photos Wound Measurements Length: (cm) 2.7 Width: (cm) 2.6 Depth: (cm) 0.2 Area: (cm) 5.513 Volume: (cm) 1.103 % Reduction in Area: 35.5% % Reduction in Volume: -29% Epithelialization: None Tunneling: No Undermining: No Wound Description Full Thickness Without Exposed Support Classification: Structures Wound Margin: Flat and Intact Exudate Large Amount: Exudate Type: Serosanguineous Exudate Color: red, brown Foul Odor After Cleansing: No Slough/Fibrino Yes Wound Bed Granulation Amount: Medium (34-66%) Exposed Structure Granulation Quality: Pale Fascia Exposed: No Necrotic Amount: Medium (34-66%) Fat Layer (Subcutaneous Tissue) Exposed: Yes Necrotic Quality:  Adherent Slough Tendon Exposed: No Muscle Exposed: No Joint Exposed: No Bone Exposed: No Vanpatten, Iliyana J. ([id number]) Treatment Notes Wound #5 (Left, Medial Lower Leg) Notes TCA, SIlver Alginate, Drawtex, ABD, 3-Layer, unna to anchor Electronic Signature(s) Signed: 09/30/2019 4:07:24 PM By: Harold Barban Entered By: Harold Barban on 09/30/2019 13:13:41 Chambless, [name] -------------------------------------------------------------------------------- Wound Assessment Details Patient Name: [name], [name]. Date of Service: 09/30/2019 12:45 PM Medical Record Number: [id number] Patient Account Number: [id number] Date of Birth/Sex: 1946/03/14 (73 y.o. F) Treating RN: Harold Barban Primary Care Amiley Shishido: [name] Other Clinician: Referring Yeng Perz: [name] Treating Patrena Santalucia/Extender: STONE III, HOYT Weeks in Treatment: 42 Wound Status Wound Number: 6 Primary Venous Leg Ulcer Etiology: Wound Location: Left Lower Leg - Lateral Wound Open Wounding Event: Gradually Appeared Status: Date Acquired: 01/19/2019 Comorbid Cataracts, Asthma, Sleep Apnea, Deep Vein Weeks Of Treatment: 36 History: Thrombosis, Hypertension, Peripheral Venous Clustered Wound: No Disease, Osteoarthritis, Received Chemotherapy, Received Radiation Photos Wound Measurements Length: (cm) 5.3 Width: (cm) 2.5 Depth: (cm) 0.2 Area: (cm) 10.407 Volume: (cm) 2.081 % Reduction in Area: -4630.5% % Reduction in Volume: -9359.1% Epithelialization: None Tunneling: No Undermining: No Wound Description Full Thickness Without Exposed Support Classification: Structures Wound Margin: Flat and Intact Exudate Large Amount: Exudate Type: Serosanguineous Exudate Color: red, brown Foul Odor After Cleansing: No Slough/Fibrino Yes Wound Bed Granulation Amount: Medium (34-66%) Exposed Structure Granulation Quality: Pink Fascia Exposed: No Necrotic Amount: Medium (34-66%) Fat  Layer (Subcutaneous Tissue) Exposed: Yes Necrotic Quality: Adherent Slough Tendon Exposed: No Muscle Exposed: No Joint Exposed: No Bone Exposed: No Korol, Averlee J. ([id number]) Treatment Notes Wound #6 (Left, Lateral Lower Leg) Notes TCA, SIlver Alginate, Drawtex, ABD, 3-Layer, unna to anchor Electronic Signature(s) Signed: 09/30/2019 4:07:24 PM By: Harold Barban Entered By: Harold Barban on 09/30/2019 13:14:56 Banik, [name] -------------------------------------------------------------------------------- Wound Assessment Details Patient Name: [name], [name]. Date of Service: 09/30/2019 12:45 PM Medical Record Number: [id number] Patient Account Number:  [id number] Date of Birth/Sex: 08-12-1946 (73 y.o. F) Treating RN: Harold Barban Primary Care Keiron Iodice: [name] Other Clinician: Referring Chelcee Korpi: [name] Treating Hollyanne Schloesser/Extender: STONE III, HOYT Weeks in Treatment: 42 Wound Status Wound Number: 9 Primary Venous Leg Ulcer Etiology: Wound Location: Left Lower Leg - Lateral, Posterior Wound Open Wounding Event: Gradually Appeared Status: Date Acquired: 07/08/2019 Comorbid Cataracts, Asthma, Sleep Apnea, Deep Vein Weeks Of Treatment: 12 History: Thrombosis, Hypertension, Peripheral Venous Clustered Wound: No Disease, Osteoarthritis, Received Chemotherapy, Received Radiation Photos Wound Measurements Length: (cm) 1.2 Width: (cm) 1.1 Depth: (cm) 0.1 Area: (cm) 1.037 Volume: (cm) 0.104 % Reduction in Area: -83.5% % Reduction in Volume: -82.5% Epithelialization: None Tunneling: No Undermining: No Wound Description Full Thickness Without Exposed Support Classification: Structures Wound Margin: Flat and Intact Exudate Large Amount: Exudate Type: Serosanguineous Exudate Color: red, brown Foul Odor After Cleansing: No Slough/Fibrino Yes Wound Bed Granulation Amount: Medium (34-66%) Exposed Structure Granulation Quality:  Pink Fascia Exposed: No Necrotic Amount: Medium (34-66%) Fat Layer (Subcutaneous Tissue) Exposed: Yes Necrotic Quality: Adherent Slough Tendon Exposed: No Muscle Exposed: No Joint Exposed: No Bone Exposed: No Dacosta, Aniesha J. ([id number]) Treatment Notes Wound #9 (Left, Lateral, Posterior Lower Leg) Notes TCA, SIlver Alginate, Drawtex, ABD, 3-Layer, unna to anchor Electronic Signature(s) Signed: 09/30/2019 4:07:24 PM By: Harold Barban Entered By: Harold Barban on 09/30/2019 13:15:25 Cain, [name] -------------------------------------------------------------------------------- Vitals Details Patient Name: [name], [name]. Date of Service: 09/30/2019 12:45 PM Medical Record Number: [id number] Patient Account Number: [id number] Date of Birth/Sex: November 10, 1945 (73 y.o. F) Treating RN: Harold Barban Primary Care Cailan General: [name] Other Clinician: Referring Anastasya Jewell: [name] Treating Billijo Dilling/Extender: STONE III, HOYT Weeks in Treatment: 42 Vital Signs Time Taken: 13:00 Temperature (F): 98.7 Height (in): 63 Pulse (bpm): 85 Weight (lbs): 224.7 Respiratory Rate (breaths/min): 18 Body Mass Index (BMI): 39.8 Blood Pressure (mmHg): 155/78 Reference Range: 80 - 120 mg / dl Electronic Signature(s) Signed: 09/30/2019 4:07:24 PM By: Harold Barban Entered By: Harold Barban on 09/30/2019 13:01:57

## 2019-10-05 ENCOUNTER — Other Ambulatory Visit: Payer: Self-pay | Admitting: Family Medicine

## 2019-10-05 DIAGNOSIS — I10 Essential (primary) hypertension: Secondary | ICD-10-CM

## 2019-10-05 DIAGNOSIS — E559 Vitamin D deficiency, unspecified: Secondary | ICD-10-CM

## 2019-10-05 DIAGNOSIS — D509 Iron deficiency anemia, unspecified: Secondary | ICD-10-CM

## 2019-10-07 ENCOUNTER — Other Ambulatory Visit: Payer: Self-pay

## 2019-10-07 ENCOUNTER — Encounter: Payer: Medicare Other | Attending: Internal Medicine | Admitting: Internal Medicine

## 2019-10-07 ENCOUNTER — Other Ambulatory Visit (INDEPENDENT_AMBULATORY_CARE_PROVIDER_SITE_OTHER): Payer: Medicare Other

## 2019-10-07 ENCOUNTER — Ambulatory Visit: Payer: Medicare Other

## 2019-10-07 DIAGNOSIS — I890 Lymphedema, not elsewhere classified: Secondary | ICD-10-CM | POA: Insufficient documentation

## 2019-10-07 DIAGNOSIS — G4730 Sleep apnea, unspecified: Secondary | ICD-10-CM | POA: Insufficient documentation

## 2019-10-07 DIAGNOSIS — L97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d: Secondary | ICD-10-CM | POA: Diagnosis not present

## 2019-10-07 DIAGNOSIS — Z86718 Personal history of other venous thrombosis and embolism: Secondary | ICD-10-CM | POA: Diagnosis not present

## 2019-10-07 DIAGNOSIS — E559 Vitamin D deficiency, unspecified: Secondary | ICD-10-CM | POA: Diagnosis not present

## 2019-10-07 DIAGNOSIS — Z85820 Personal history of malignant melanoma of skin: Secondary | ICD-10-CM | POA: Diagnosis not present

## 2019-10-07 DIAGNOSIS — I87332 Chronic venous hypertension (idiopathic) with ulcer and inflammation of left lower extremity: Secondary | ICD-10-CM | POA: Diagnosis not present

## 2019-10-07 DIAGNOSIS — Z9221 Personal history of antineoplastic chemotherapy: Secondary | ICD-10-CM | POA: Insufficient documentation

## 2019-10-07 DIAGNOSIS — L97222 Non-pressure chronic ulcer of left calf with fat layer exposed: Secondary | ICD-10-CM | POA: Insufficient documentation

## 2019-10-07 DIAGNOSIS — Z923 Personal history of irradiation: Secondary | ICD-10-CM | POA: Insufficient documentation

## 2019-10-07 DIAGNOSIS — I10 Essential (primary) hypertension: Secondary | ICD-10-CM | POA: Insufficient documentation

## 2019-10-07 LAB — BASIC METABOLIC PANEL
BUN: 13 mg/dL (ref 6–23)
CO2: 30 mEq/L (ref 19–32)
Calcium: 9.7 mg/dL (ref 8.4–10.5)
Chloride: 104 mEq/L (ref 96–112)
Creatinine, Ser: 0.71 mg/dL (ref 0.40–1.20)
GFR: 80.64 mL/min (ref >60.00)
Glucose, Bld: 104 mg/dL — ABNORMAL HIGH (ref 70–99)
Potassium: 4.2 mEq/L (ref 3.5–5.1)
Sodium: 142 mEq/L (ref 135–145)

## 2019-10-07 LAB — TSH: TSH: 3.29 u[IU]/mL (ref 0.35–4.50)

## 2019-10-07 LAB — VITAMIN D 25 HYDROXY (VIT D DEFICIENCY, FRACTURES): VITD: 40.79 ng/mL (ref 30.00–100.00)

## 2019-10-07 NOTE — Progress Notes (Addendum)
FAYMA, LEVICK ([id number]) Visit Report for 10/07/2019 Arrival Information Details Patient Name: [name], [name]. Date of Service: 10/07/2019 2:00 PM Medical Record Number: [id number] Patient Account Number: [id number] Date of Birth/Sex: [birth year] (73 y.o. F) Treating RN: Army Melia Primary Care Tremel Setters: [name] Other Clinician: Referring Judith Campillo: [name] Treating Darian Cansler/Extender: Tito Dine in Treatment: 67 Visit Information History Since Last Visit Added or deleted any medications: No Patient Arrived: Ambulatory Any new allergies or adverse reactions: No Arrival Time: 13:52 Had a fall or experienced change in No Accompanied By: self activities of daily living that may affect Transfer Assistance: None risk of falls: Patient Identification Verified: Yes Signs or symptoms of abuse/neglect since last visito No Patient Requires Transmission-Based No Hospitalized since last visit: No Precautions: Has Dressing in Place as Prescribed: Yes Patient Has Alerts: Yes Pain Present Now: No Patient Alerts: Patient on Blood Thinner aspirin 81 Electronic Signature(s) Signed: 10/07/2019 2:06:01 PM By: Army Melia Entered By: Army Melia on 10/07/2019 13:52:51 [name], [name] -------------------------------------------------------------------------------- Encounter Discharge Information Details Patient Name: [name], [name]. Date of Service: 10/07/2019 2:00 PM Medical Record Number: [id number] Patient Account Number: [id number] Date of Birth/Sex: June 25, 1946 (73 y.o. F) Treating RN: Harold Barban Primary Care Ilea Hilton: [name] Other Clinician: Referring Kaliah Haddaway: [name] Treating Tenise Stetler/Extender: Tito Dine in Treatment: 84 Encounter Discharge Information Items Post Procedure Vitals Discharge Condition: Stable Temperature (F): 98.1 Ambulatory Status: Ambulatory Pulse (bpm): 86 Discharge Destination:  Home Respiratory Rate (breaths/min): 18 Transportation: Private Auto Blood Pressure (mmHg): 147/72 Accompanied By: self Schedule Follow-up Appointment: Yes Clinical Summary of Care: Electronic Signature(s) Signed: 10/07/2019 4:10:47 PM By: Harold Barban Entered By: Harold Barban on 10/07/2019 14:55:28 [name], [name] -------------------------------------------------------------------------------- Lower Extremity Assessment Details Patient Name: [name], [name]. Date of Service: 10/07/2019 2:00 PM Medical Record Number: [id number] Patient Account Number: [id number] Date of Birth/Sex: 09-[name]-1946 (73 y.o. F) Treating RN: Army Melia Primary Care Assad Harbeson: [name] Other Clinician: Referring Shaeleigh Graw: [name] Treating Duward Allbritton/Extender: [name] Dillon Weeks in Treatment: 43 Edema Assessment Assessed: [Left: No] [Right: No] Edema: [Left: N] [Right: o] Vascular Assessment Pulses: Dorsalis Pedis Palpable: [Left:Yes] Electronic Signature(s) Signed: 10/07/2019 2:06:01 PM By: Army Melia Entered By: Army Melia on 10/07/2019 14:01:18 [name], [name] -------------------------------------------------------------------------------- Multi Wound Chart Details Patient Name: [name], [name]. Date of Service: 10/07/2019 2:00 PM Medical Record Number: [id number] Patient Account Number: [id number] Date of Birth/Sex: 03-29-1946 (73 y.o. F) Treating RN: Harold Barban Primary Care Zaela Graley: [name] Other Clinician: Referring Kycen Spalla: [name] Treating Dameion Briles/Extender: Tito Dine in Treatment: 15 Vital Signs Height(in): 63 Pulse(bpm): 60 Weight(lbs): 224.7 Blood Pressure(mmHg): 148/72 Body Mass Index(BMI): 40 Temperature(F): 98.1 Respiratory Rate 16 (breaths/min): Photos: Wound Location: Left Lower Leg - Medial Left Lower Leg - Lateral Left Lower Leg - Lateral, Posterior Wounding Event: Gradually  Appeared Gradually Appeared Gradually Appeared Primary Etiology: Lymphedema Venous Leg Ulcer Venous Leg Ulcer Comorbid History: Cataracts, Asthma, Sleep Cataracts, Asthma, Sleep Cataracts, Asthma, Sleep Apnea, Deep Vein Apnea, Deep Vein Apnea, Deep Vein Thrombosis, Hypertension, Thrombosis, Hypertension, Thrombosis, Hypertension, Peripheral Venous Disease, Peripheral Venous Disease, Peripheral Venous Disease, Osteoarthritis, Received Osteoarthritis, Received Osteoarthritis, Received Chemotherapy, Received Chemotherapy, Received Chemotherapy, Received Radiation Radiation Radiation Date Acquired: 11/19/2018 01/19/2019 07/08/2019 Weeks of Treatment: 43 37 13 Wound Status: Open Open Open Measurements L x W x D 2.8x3.5x0.2 4x3x0.2 0.8x1x0.1 (cm) Area (cm) : 7.697 9.425 0.628 Volume (cm) : 1.539 1.885 0.063 % Reduction in Area: 9.90% -4184.10% -11.20% % Reduction  in Volume: -80.00% -8468.20% -10.50% Classification: Full Thickness Without Full Thickness Without Full Thickness Without Exposed Support Structures Exposed Support Structures Exposed Support Structures Exudate Amount: Large Large Large Exudate Type: Serosanguineous Serosanguineous Serosanguineous Exudate Color: red, brown red, brown red, brown Wound Margin: Flat and Intact Flat and Intact Flat and Intact Granulation Amount: Medium (34-66%) Medium (34-66%) Medium (34-66%) Granulation Quality: Pale Pink Pink Necrotic Amount: Medium (34-66%) Medium (34-66%) Medium (34-66%) [name], [name] ([id number]) Exposed Structures: Fat Layer (Subcutaneous Fat Layer (Subcutaneous Fat Layer (Subcutaneous Tissue) Exposed: Yes Tissue) Exposed: Yes Tissue) Exposed: Yes Fascia: No Fascia: No Fascia: No Tendon: No Tendon: No Tendon: No Muscle: No Muscle: No Muscle: No Joint: No Joint: No Joint: No Bone: No Bone: No Bone: No Epithelialization: None None None Debridement: Debridement - Excisional Debridement - Excisional Debridement -  Excisional Pre-procedure 14:37 14:37 14:37 Verification/Time Out Taken: Pain Control: Lidocaine Lidocaine Lidocaine Tissue Debrided: Subcutaneous, Slough Subcutaneous, Slough Subcutaneous, Slough Level: Skin/Subcutaneous Tissue Skin/Subcutaneous Tissue Skin/Subcutaneous Tissue Debridement Area (sq cm): 9.8 12 0.8 Instrument: Curette Curette Curette Bleeding: Minimum Minimum Minimum Hemostasis Achieved: Pressure Pressure Pressure Procedural Pain: 3 3 3  Post Procedural Pain: 0 0 0 Debridement Treatment Procedure was tolerated well Procedure was tolerated well Procedure was tolerated well Response: Post Debridement 2.8x3.5x0.2 4x3x0.2 0.8x1.4x0.1 Measurements L x W x D (cm) Post Debridement Volume: 1.539 1.885 0.088 (cm) Procedures Performed: Debridement Debridement Debridement Treatment Notes Electronic Signature(s) Signed: 10/07/2019 4:48:24 PM By: Linton Ham MD Entered By: Linton Ham on 10/07/2019 14:50:55 [name], [name] -------------------------------------------------------------------------------- Multi-Disciplinary Care Plan Details Patient Name: [name], [name]. Date of Service: 10/07/2019 2:00 PM Medical Record Number: [id number] Patient Account Number: [id number] Date of Birth/Sex: [birth year] (73 y.o. F) Treating RN: Harold Barban Primary Care Terelle Dobler: [name] Other Clinician: Referring Treyvon Blahut: [name] Treating Rosamae Rocque/Extender: Tito Dine in Treatment: 87 Active Inactive Orientation to the Wound Care Program Nursing Diagnoses: Knowledge deficit related to the wound healing center program Goals: Patient/caregiver will verbalize understanding of the [facility] Date Initiated: 12/10/2018 Target Resolution Date: 01/09/2019 Goal Status: Active Interventions: Provide education on orientation to the wound center Notes: Soft Tissue Infection Nursing Diagnoses: Impaired tissue  integrity Goals: Patient's soft tissue infection will resolve Date Initiated: 12/10/2018 Target Resolution Date: 01/09/2019 Goal Status: Active Interventions: Assess signs and symptoms of infection every visit Notes: Venous Leg Ulcer Nursing Diagnoses: Actual venous Insuffiency (use after diagnosis is confirmed) Goals: Patient will maintain optimal edema control Date Initiated: 12/10/2018 Target Resolution Date: 01/09/2019 Goal Status: Active Interventions: Assess peripheral edema status every visit. PASHENCE, SEBOLD ([id number]) Treatment Activities: Therapeutic compression applied : 12/10/2018 Notes: Wound/Skin Impairment Nursing Diagnoses: Impaired tissue integrity Goals: Patient/caregiver will verbalize understanding of skin care regimen Date Initiated: 12/10/2018 Target Resolution Date: 01/09/2019 Goal Status: Active Interventions: Assess ulceration(s) every visit Treatment Activities: Topical wound management initiated : 12/10/2018 Notes: Electronic Signature(s) Signed: 10/07/2019 4:10:47 PM By: Harold Barban Entered By: Harold Barban on 10/07/2019 14:35:12 [place] Candal, [name] -------------------------------------------------------------------------------- Pain Assessment Details Patient Name: [name], [name]. Date of Service: 10/07/2019 2:00 PM Medical Record Number: [id number] Patient Account Number: [id number] Date of Birth/Sex: 09/13/1946 (73 y.o. F) Treating RN: Army Melia Primary Care [name] [name]: [name] Other Clinician: Referring Jeanluc Wegman: [name] Treating Wretha Laris/Extender: Tito Dine in Treatment: 67 Active Problems Location of Pain Severity and Description of Pain Patient Has Paino No Site Locations Pain Management and Medication Current Pain Management: Electronic Signature(s) Signed: 10/07/2019 2:06:01 PM By: Army Melia Entered  By: Army Melia on 10/07/2019 13:52:56 [name], [name]  ([id number]) -------------------------------------------------------------------------------- Patient/Caregiver Education Details Patient Name: NAIRI, ROMINGER. Date of Service: 10/07/2019 2:00 PM Medical Record Number: [id number] Patient Account Number: [id number] Date of Birth/Gender: 13-Jul-1946 (73 y.o. F) Treating RN: Harold Barban Primary Care Physician: [name] Other Clinician: Referring Physician: [name] Treating Physician/Extender: Tito Dine in Treatment: 35 Education Assessment Education Provided To: Patient Education Topics Provided Wound/Skin Impairment: Handouts: Caring for Your Ulcer Electronic Signature(s) Signed: 10/07/2019 4:10:47 PM By: Harold Barban Entered By: Harold Barban on 10/07/2019 14:36:34 Coleman, [name] -------------------------------------------------------------------------------- Wound Assessment Details Patient Name: [name], [name]. Date of Service: 10/07/2019 2:00 PM Medical Record Number: [id number] Patient Account Number: [id number] Date of Birth/Sex: [name] 16, 1946 (73 y.o. F) Treating RN: Army Melia Primary Care Maylani Embree: [name] Other Clinician: Referring Elice Crigger: [name] Treating Lashia Niese/Extender: Tito Dine in Treatment: 43 Wound Status Wound Number: 5 Primary Lymphedema Etiology: Wound Location: Left Lower Leg - Medial Wound Open Wounding Event: Gradually Appeared Status: Date Acquired: 11/19/2018 Comorbid Cataracts, Asthma, Sleep Apnea, Deep Vein Weeks Of Treatment: 43 History: Thrombosis, Hypertension, Peripheral Venous Clustered Wound: No Disease, Osteoarthritis, Received Chemotherapy, Received Radiation Photos Wound Measurements Length: (cm) 2.8 Width: (cm) 3.5 Depth: (cm) 0.2 Area: (cm) 7.697 Volume: (cm) 1.539 % Reduction in Area: 9.9% % Reduction in Volume: -80% Epithelialization: None Tunneling: No Undermining: No Wound  Description Full Thickness Without Exposed Support Classification: Structures Wound Margin: Flat and Intact Exudate Large Amount: Exudate Type: Serosanguineous Exudate Color: red, brown Foul Odor After Cleansing: No Slough/Fibrino Yes Wound Bed Granulation Amount: Medium (34-66%) Exposed Structure Granulation Quality: Pale Fascia Exposed: No Necrotic Amount: Medium (34-66%) Fat Layer (Subcutaneous Tissue) Exposed: Yes Necrotic Quality: Adherent Slough Tendon Exposed: No Muscle Exposed: No Joint Exposed: No Bone Exposed: No Fugett, Jnyah J. ([id number]) Treatment Notes Wound #5 (Left, Medial Lower Leg) Notes TCA, adaptic, silver alginate, xtrasorb, 3layer Left unna to anchor Electronic Signature(s) Signed: 10/07/2019 2:06:01 PM By: Army Melia Entered By: Army Melia on 10/07/2019 14:00:07 Nardozzi, [name] -------------------------------------------------------------------------------- Wound Assessment Details Patient Name: [name], [name]. Date of Service: 10/07/2019 2:00 PM Medical Record Number: [id number] Patient Account Number: [id number] Date of Birth/Sex: [birth year] (73 y.o. F) Treating RN: Army Melia Primary Care Jacobe Study: [name] Other Clinician: Referring Nikki Glanzer: [name] Treating Trinette Vera/Extender: Tito Dine in Treatment: 43 Wound Status Wound Number: 6 Primary Venous Leg Ulcer Etiology: Wound Location: Left Lower Leg - Lateral Wound Open Wounding Event: Gradually Appeared Status: Date Acquired: 01/19/2019 Comorbid Cataracts, Asthma, Sleep Apnea, Deep Vein Weeks Of Treatment: 37 History: Thrombosis, Hypertension, Peripheral Venous Clustered Wound: No Disease, Osteoarthritis, Received Chemotherapy, Received Radiation Photos Wound Measurements Length: (cm) 4 Width: (cm) 3 Depth: (cm) 0.2 Area: (cm) 9.425 Volume: (cm) 1.885 % Reduction in Area: -4184.1% % Reduction in Volume:  -8468.2% Epithelialization: None Tunneling: No Undermining: No Wound Description Full Thickness Without Exposed Support Classification: Structures Wound Margin: Flat and Intact Exudate Large Amount: Exudate Type: Serosanguineous Exudate Color: red, brown Foul Odor After Cleansing: No Slough/Fibrino Yes Wound Bed Granulation Amount: Medium (34-66%) Exposed Structure Granulation Quality: Pink Fascia Exposed: No Necrotic Amount: Medium (34-66%) Fat Layer (Subcutaneous Tissue) Exposed: Yes Necrotic Quality: Adherent Slough Tendon Exposed: No Muscle Exposed: No Joint Exposed: No Bone Exposed: No Valladares, Adilene J. ([id number]) Treatment Notes Wound #6 (Left, Lateral Lower Leg) Notes TCA, adaptic, silver alginate, xtrasorb, 3layer Left unna to anchor Electronic Signature(s) Signed: 10/07/2019 2:06:01 PM By: Army Melia Entered By:  Army Melia on 10/07/2019 14:00:32 [name], [name] -------------------------------------------------------------------------------- Wound Assessment Details Patient Name: [name], [name]. Date of Service: 10/07/2019 2:00 PM Medical Record Number: [id number] Patient Account Number: [id number] Date of Birth/Sex: [name] 25, 1946 (73 y.o. F) Treating RN: Army Melia Primary Care Salomon Ganser: [name] Other Clinician: Referring [name] Strength: [name] Treating Jojuan Champney/Extender: Tito Dine in Treatment: 43 Wound Status Wound Number: 9 Primary Venous Leg Ulcer Etiology: Wound Location: Left Lower Leg - Lateral, Posterior Wound Open Wounding Event: Gradually Appeared Status: Date Acquired: 07/08/2019 Comorbid Cataracts, Asthma, Sleep Apnea, Deep Vein Weeks Of Treatment: 13 History: Thrombosis, Hypertension, Peripheral Venous Clustered Wound: No Disease, Osteoarthritis, Received Chemotherapy, Received Radiation Photos Wound Measurements Length: (cm) 0.8 Width: (cm) 1 Depth: (cm) 0.1 Area: (cm) 0.628 Volume: (cm)  0.063 % Reduction in Area: -11.2% % Reduction in Volume: -10.5% Epithelialization: None Tunneling: No Undermining: No Wound Description Full Thickness Without Exposed Support Classification: Structures Wound Margin: Flat and Intact Exudate Large Amount: Exudate Type: Serosanguineous Exudate Color: red, brown Foul Odor After Cleansing: No Slough/Fibrino Yes Wound Bed Granulation Amount: Medium (34-66%) Exposed Structure Granulation Quality: Pink Fascia Exposed: No Necrotic Amount: Medium (34-66%) Fat Layer (Subcutaneous Tissue) Exposed: Yes Necrotic Quality: Adherent Slough Tendon Exposed: No Muscle Exposed: No Joint Exposed: No Bone Exposed: No [name], [name] ([id number]) Treatment Notes Wound #9 (Left, Lateral, Posterior Lower Leg) Notes TCA, adaptic, silver alginate, xtrasorb, 3layer Left unna to anchor Electronic Signature(s) Signed: 10/07/2019 2:06:01 PM By: Army Melia Entered By: Army Melia on 10/07/2019 14:01:01 [place], [name] -------------------------------------------------------------------------------- Vitals Details Patient Name: SAVANNAH, MEIR. Date of Service: 10/07/2019 2:00 PM Medical Record Number: [id number] Patient Account Number: [id number] Date of Birth/Sex: 01-27-1946 (73 y.o. F) Treating RN: Army Melia Primary Care Lillyen Schow: [name] Other Clinician: Referring Xuan Mateus: [name] Treating Deniz Hannan/Extender: Tito Dine in Treatment: 43 Vital Signs Time Taken: 13:53 Temperature (F): 98.1 Height (in): 63 Pulse (bpm): 86 Weight (lbs): 224.7 Respiratory Rate (breaths/min): 16 Body Mass Index (BMI): 39.8 Blood Pressure (mmHg): 148/72 Reference Range: 80 - 120 mg / dl Electronic Signature(s) Signed: 10/07/2019 2:06:01 PM By: Army Melia Entered By: Army Melia on 10/07/2019 13:57:32

## 2019-10-07 NOTE — Progress Notes (Signed)
JAMERE, HALLOWELL ([id number]) Visit Report for 10/07/2019 Debridement Details Patient Name: [name], [name]. Date of Service: 10/07/2019 2:00 PM Medical Record Number: [id number] Patient Account Number: [id number] Date of Birth/Sex: [birth year] (73 y.o. F) Treating RN: Harold Barban Primary Care Provider: [name] Other Clinician: Referring Provider: [name] Treating Provider/Extender: Tito Dine in Treatment: 43 Debridement Performed for Wound #5 Left,Medial Lower Leg Assessment: Performed By: Physician [name] Dillon, MD Debridement Type: Debridement Level of Consciousness (Pre- Awake and Alert procedure): Pre-procedure Verification/Time Yes - 14:37 Out Taken: Start Time: 14:37 Pain Control: Lidocaine Total Area Debrided (L x W): 2.8 (cm) x 3.5 (cm) = 9.8 (cm) Tissue and other material Viable, Non-Viable, Slough, Subcutaneous, Slough debrided: Level: Skin/Subcutaneous Tissue Debridement Description: Excisional Instrument: Curette Bleeding: Minimum Hemostasis Achieved: Pressure End Time: 14:39 Procedural Pain: 3 Post Procedural Pain: 0 Response to Treatment: Procedure was tolerated well Level of Consciousness Awake and Alert (Post-procedure): Post Debridement Measurements of Total Wound Length: (cm) 2.8 Width: (cm) 3.5 Depth: (cm) 0.2 Volume: (cm) 1.539 Character of Wound/Ulcer Post Debridement: Improved Post Procedure Diagnosis Same as Pre-procedure Electronic Signature(s) Signed: 10/07/2019 4:10:47 PM By: Harold Barban Signed: 10/07/2019 4:48:24 PM By: Linton Ham MD Entered By: Linton Ham on 10/07/2019 14:51:15 Tait, [name] ([id number]) -------------------------------------------------------------------------------- Debridement Details Patient Name: [name], [name]. Date of Service: 10/07/2019 2:00 PM Medical Record Number: [id number] Patient Account Number: [id number] Date of Birth/Sex: [birth year] (73 y.o.  F) Treating RN: Harold Barban Primary Care Provider: [name] Other Clinician: Referring Provider: [name] Treating Provider/Extender: Tito Dine in Treatment: 42 Debridement Performed for Wound #6 Left,Lateral Lower Leg Assessment: Performed By: Physician [name] Dillon, MD Debridement Type: Debridement Severity of Tissue Pre Fat layer exposed Debridement: Level of Consciousness (Pre- Awake and Alert procedure): Pre-procedure Verification/Time Yes - 14:37 Out Taken: Start Time: 14:37 Pain Control: Lidocaine Total Area Debrided (L x W): 4 (cm) x 3 (cm) = 12 (cm) Tissue and other material Viable, Non-Viable, Slough, Subcutaneous, Slough debrided: Level: Skin/Subcutaneous Tissue Debridement Description: Excisional Instrument: Curette Bleeding: Minimum Hemostasis Achieved: Pressure End Time: 14:41 Procedural Pain: 3 Post Procedural Pain: 0 Response to Treatment: Procedure was tolerated well Level of Consciousness Awake and Alert (Post-procedure): Post Debridement Measurements of Total Wound Length: (cm) 4 Width: (cm) 3 Depth: (cm) 0.2 Volume: (cm) 1.885 Character of Wound/Ulcer Post Debridement: Improved Severity of Tissue Post Debridement: Fat layer exposed Post Procedure Diagnosis Same as Pre-procedure Electronic Signature(s) Signed: 10/07/2019 4:10:47 PM By: Harold Barban Signed: 10/07/2019 4:48:24 PM By: Linton Ham MD Entered By: Linton Ham on 10/07/2019 14:51:28 Valadez, [name] ([id number]) -------------------------------------------------------------------------------- Debridement Details Patient Name: [name], [name]. Date of Service: 10/07/2019 2:00 PM Medical Record Number: [id number] Patient Account Number: [id number] Date of Birth/Sex: [name] 10, 1946 (73 y.o. F) Treating RN: Harold Barban Primary Care Provider: [name] Other Clinician: Referring Provider: [name] Treating  Provider/Extender: Tito Dine in Treatment: 36 Debridement Performed for Wound #9 Left,Lateral,Posterior Lower Leg Assessment: Performed By: Physician [name] Dillon, MD Debridement Type: Debridement Severity of Tissue Pre Fat layer exposed Debridement: Level of Consciousness (Pre- Awake and Alert procedure): Pre-procedure Verification/Time Yes - 14:37 Out Taken: Start Time: 14:37 Pain Control: Lidocaine Total Area Debrided (L x W): 0.8 (cm) x 1 (cm) = 0.8 (cm) Tissue and other material Viable, Non-Viable, Slough, Subcutaneous, Slough debrided: Level: Skin/Subcutaneous Tissue Debridement Description: Excisional Instrument: Curette Bleeding: Minimum Hemostasis Achieved: Pressure End Time: 14:39 Procedural Pain: 3 Post Procedural Pain: 0 Response to Treatment:  Procedure was tolerated well Level of Consciousness Awake and Alert (Post-procedure): Post Debridement Measurements of Total Wound Length: (cm) 0.8 Width: (cm) 1.4 Depth: (cm) 0.1 Volume: (cm) 0.088 Character of Wound/Ulcer Post Debridement: Improved Severity of Tissue Post Debridement: Fat layer exposed Post Procedure Diagnosis Same as Pre-procedure Electronic Signature(s) Signed: 10/07/2019 4:10:47 PM By: Harold Barban Signed: 10/07/2019 4:48:24 PM By: Linton Ham MD Entered By: Linton Ham on 10/07/2019 14:51:41 [name], [name] ([id number]) -------------------------------------------------------------------------------- HPI Details Patient Name: [name], [name]. Date of Service: 10/07/2019 2:00 PM Medical Record Number: [id number] Patient Account Number: [id number] Date of Birth/Sex: 1946/01/29 (73 y.o. F) Treating RN: Harold Barban Primary Care Provider: [name] Other Clinician: Referring Provider: [name] Treating Provider/Extender: Tito Dine in Treatment: 39 History of Present Illness HPI Description: Pleasant 73 year old with history of  chronic venous insufficiency. No diabetes or peripheral vascular disease. Left ABI 1.29. Questionable history of left lower extremity DVT. She developed a recurrent ulceration on her left lateral calf in December 2015, which she attributes to poor diet and subsequent lower extremity edema. She underwent endovenous laser ablation of her left greater saphenous vein in 2010. She underwent laser ablation of accessory branch of left GSV in April 2016 by Dr. Kellie Simmering at [facility]. She was previously wearing Unna boots, which she tolerated well. Tolerating 2 layer compression and cadexomer iodine. She returns to clinic for follow-up and is without new complaints. She denies any significant pain at this time. She reports persistent pain with pressure. No claudication or ischemic rest pain. No fever or chills. No drainage. READMISSION 11/13/16; this is a 73 year old woman who is not a diabetic. She is here for a review of a painful area on her left medial lower extremity. I note that she was seen here previously last year for wound I believe to be in the same area. At that time she had undergone previously a left greater saphenous vein ablation by Dr. Kellie Simmering and she had a ablation of the anterior accessory branch of the left greater saphenous vein in March 2016. Seeing that the wound actually closed over. In reviewing the history with her today the ulcer in this area has been recurrent. She describes a biopsy of this area in 2009 that only showed stasis physiology. 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 Do not see that she has ever had arterial studies in the left leg. She tells me she has a follow-up with Dr. Kellie Simmering in roughly 10 days In any case she developed the reopening of this area  roughly a month ago. On the background of this she describes rapidly increasing edema which has responded to Lasix 40 mg and metolazone 2.5 mg as well as the patient's lymph massage. She has been told she has both venous insufficiency and lymphedema but she cannot tolerate compression stockings 11/28/16; the patient saw Dr. Kellie Simmering recently. Per the patient he did arterial Dopplers in the office that did not show evidence of arterial insufficiency, per the patient he stated "treat this like an ordinary venous ulcer". She also saw her dermatologist Dr. Ronnald Ramp who felt that this was more of a vascular ulcer. In general things are improving although she arrives today with increasing bilateral lower extremity edema with weeping a deeper fluid through  the wound on the left medial leg compatible with some degree of lymphedema 12/04/16; the patient's wound is fully epithelialized but I don't think fully healed. We will do another week of depression with Promogran and TCA however I suspect we'll be able to discharge her next week. This is a very unusual-looking wound which was initially a figure-of-eight type wound lying on its side surrounded by petechial like hemorrhage. She has had venous ablation on this side. She apparently does not have an arterial issue per Dr. Kellie Simmering. She saw her dermatologist thought it was "vascular". Patient is definitely going to need ongoing compression and I talked about this with her today she will go to elastic therapy after she leaves here next week 12/11/16; the patient's wound is not completely closed today. She has surrounding scar tissue and in further discussion with the patient it would appear that she had ulcers in this area in 2009 for a prolonged period of time ultimately requiring a punch biopsy of this area that only showed venous insufficiency. I did not previously pickup on this part of the history from the patient. 12/18/16; the patient's wound is completely  epithelialized. There is no open area here. She has significant bilateral venous insufficiency with secondary lymphedema to a mild-to-moderate degree she does not have compression stockings.. She did not say anything to me when I was in the room, she told our intake nurse that she was still having pain in this area. This isn't unusual recurrent small open area. She is going to go to elastic therapy to obtain compression stockings. 12/25/16; the patient's wound is fully epithelialized. There is no open area here. The patient describes some continued episodic discomfort in this area medial left calf. However everything looks fine and healed here. She is been to elastic therapy and caught herself 15-20 mmHg stockings, they apparently were having trouble getting 20-30 mm stockings in her size [name], [name] 01/22/17; this is a patient we discharged from the clinic a month ago. She has a recurrent open wound on her medial left calf. She had 15 mm support stockings. I told her I thought she needed 20-30 mm compression stockings. She tells me that she has been ill with hospitalization secondary to asthma and is been found to have severe hypokalemia likely secondary to a combination of Lasix and metolazone. This morning she noted blistering and leaking fluid on the posterior part of her left leg. She called our intake nurse urgently and we was saw her this afternoon. She has not had any real discomfort here. I don't know that she's been wearing any stockings on this leg for at least 2-3 days. ABIs in this clinic were 1.21 on the right and 1.3 on the left. She is previously seen vascular surgery who does not think that there is a peripheral arterial issue. 01/30/17; Patient arrives with no open wound on the left leg. She has been to elastic therapy and obtained 20-44mmhg below knee stockings and she has one on the right leg today. READMISSION 02/19/18; this Mazzara is a now 73 year old patient  we've had in this clinic perhaps 3 times before. I had last looked at her from January 07 December 2016 with an area on the medial left leg. We discharged her on 12/25/16 however she had to be readmitted on 01/22/17 with a recurrence. I have in my notes that we discharged her on 20-30 mm stockings although she tells me she was only wearing support hose because she cannot get  stockings on predominantly related to her cervical spine surgery/issues. She has had previous ablations done by vein and vascular in [name] including a great saphenous vein ablation on the left with an anterior accessory branch ablation I think both of these were in 2016. On one of the previous visit she had a biopsy noted 2009 that was negative. She is not felt to have an arterial issue. She is not a diabetic. She does have a history of obstructive sleep apnea hypertension asthma as well as chronic venous insufficiency and lymphedema. On this occasion she noted 2 dry scaly patch on her left leg. She tried to put lotion on this it didn't really help. There were 2 open areas.the patient has been seeing her primary physician from 02/05/18 through 02/14/18. She had Unna boots applied. The superior wound now on the lateral left leg has closed but she's had one wound that remains open on the lateral left leg. This is not the same spot as we dealt with in 2018. ABIs in this clinic were 1.3 bilaterally 02/26/18; patient has a small wound on the left lateral calf. Dimensions are down. She has chronic venous insufficiency and lymphedema. 03/05/18; small open area on the left lateral calf. Dimensions are down. Tightly adherent necrotic debris over the surface of the wound which was difficult to remove. Also the dressing [over collagen] stuck to the wound surface. This was removed with some difficulty as well. Change the primary dressing to Hydrofera Blue ready 03/12/18; small open area on the left lateral calf. Comes in with tightly adherent  surface eschar as well as some adherent Hydrofera Blue. 03/19/18; open area on the left lateral calf. Again adherent surface eschar as well as some adherent Hydrofera Blue nonviable subcutaneous tissue. She complained of pain all week even with the reduction from 4-3 layer compression I put on last week. Also she had an increase in her ankle and calf measurements probably related to the same thing. 03/26/18; open area on the left lateral calf. A very small open area remains here. We used silver alginate starting last week as the Hydrofera Blue seem to stick to the wound bed. In using 4-layer compression 04/02/18; the open area in the left lateral calf at some adherent slough which I removed there is no open area here. We are able to transition her into her own compression stocking. Truthfully I think this is probably his support hose. However this does not maintain skin integrity will be limited. She cannot put over the toe compression stockings on because of neck problems hand problems etc. She is allergic to the lining layer of juxta lites. We might be forced to use extremitease stocking should this fail READMIT 11/24/2018 Patient is now a 73 year old woman who is not a diabetic. She has been in this clinic on at least 3 previous occasions largely with recurrent wounds on her left leg secondary to chronic venous insufficiency with secondary lymphedema. Her situation is complicated by inability to get stockings on and an allergy to neoprene which is apparently a component and at least juxta lites and other stockings. As a result she really has not been wearing any stockings on her legs. She tells [place] that roughly 2 or 3 weeks ago she started noticing a stinging sensation just above her ankle on the left medial aspect. She has been diagnosed with pseudogout and she wondered whether this was what she was experiencing. She tried to dress this with something she bought at the store however subsequently  it pulled skin off and now she has an open wound that is not improving. She has been using Vaseline gauze with a cover bandage. She saw her primary doctor last week who put an Haematologist on her. ABIs in this clinic was 1.03 on the left FARHIYA, VANNATTER. ([id number]) 2/12; the area is on the left medial ankle. Odd-looking wound with what looks to be surface epithelialization but a multitude of small petechial openings. This clearly not closed yet. We have been using silver alginate under 3 layer compression with TCA 2/19; the wound area did not look quite as good this week. Necrotic debris over the majority of the wound surface which required debridement. She continues to have a multitude of what looked to be small petechial openings. She reminds [place] that she had a biopsy on this initially during her first outbreak in 2015 in [place] dermatology. She expresses concern about this being a possible melanoma. 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 I assured her that this does not look anything like melanoma. Except for the petechial reaction it does look like a venous insufficiency area and she certainly has evidence of this on both sides 2/26; a difficult area on the left medial ankle. The patient clearly has chronic venous hypertension with some degree of lymphedema. The odd thing about the area is the small petechial hemorrhages. I am not really sure how to explain this. This was present last time and this is not a compression injury. We have been using Hydrofera Blue which I changed to last week 3/4; still using Hydrofera Blue. Aggressive debridement today. She does not have known arterial issues. She has seen Dr. Kellie Simmering at [facility] vein and vascular and and has an ablation on the left. [Anterior accessory branch of the greater saphenous]. From what I remember they did not feel she had an arterial issue.  The patient has had this area biopsied in 2009 at [facility] dermatology and by her recollection they said this was "stasis". She is also follow-up with dermatology locally who thought that this was more of a vascular issue 3/11; using Hydrofera Blue. Aggressive debridement today. She does not have an arterial issue. We are using 3 layer compression although we may need to go to 4. The patient has been in for multiple changes to her wrap since I last saw her a week ago. She says that the area was leaking. I do not have too much more information on what was found 01/19/19 on evaluation today patient was actually being seen for a nurse visit when unfortunately she had the area on her left lateral lower extremity as well as weeping from the right lower extremity that became apparent. Therefore we did end up actually seeing her for a full visit with myself. She is having some pain at this site as well but fortunately nothing too significant at this point. No fevers, chills, nausea, or vomiting noted at this time. 3/18-Patient is back to the clinic with the left leg venous leg ulcer, the ulcer is larger in size, has a surface that is densely adherent with fibrinous tissue, the Hydrofera Blue was used but is densely adherent and there was difficulty in removing it. The right lower extremity was also wrapped for weeping edema. Patient has a new area over the left lateral foot above the malleolus that is small and appears to have no debris with intact surrounding skin. Patient  is on increased dose of Lasix also as a means to edema management 3/25; the patient has a nonhealing venous ulcer on the medial left leg and last week developed a smaller area on the lateral left calf. We have been using Hydrofera Blue with a contact layer. 4/1; no major change in these wounds areas. Left medial and more recently left lateral calf. I tried Iodoflex last week to aid in debridement she did not tolerate this. She stated  her pain was terrible all week. She took the top layer of the 4 layer compression off. 4/8; the patient actually looks somewhat better in terms of her more prominent left lateral calf wound. There is some healthy looking tissue here. She is still complaining of a lot of discomfort. 4/15; patient in a lot of pain secondary to sciatica. She is on a prednisone taper prescribed by her primary physician. She has the 2 areas one on the left medial and more recently a smaller area on the left lateral calf. Both of these just above the malleoli 4/22; her back pain is better but she still states she is very uncomfortable and now feels she is intolerant to the [employer]. No real change in the wounds we have been using Sorbact. She has been previously intolerant to Iodoflex. There is not a lot of option about what we can use to debride this wound under compression that she no doubt needs. sHe states Ultram no longer works for her pain 4/29; no major change in the wounds slightly increased depth. Surface on the original medial wound perhaps somewhat improved however the more recent area on the lateral left ankle is 100% covered in very adherent debris we have been using Sorbact. She tolerates 4 layer compression well and her edema control is a lot better. She has not had to come in for a nurse check 5/6; no major change in the condition of the wounds. She did consent to debridement today which was done with some difficulty. Continuing Sorbact. She did not tolerate Iodoflex. She was in for a check of her compression the day after we wrapped her last week this was adjusted but nothing much was found 5/13; no major change in the condition or area of the wounds. I was able to get a fairly aggressive debridement done on the lateral left leg wound. Even using Sorbact under compression. She came back in on Friday to have the wrap changed. She says she felt uncomfortable on the lateral aspect of her ankle. She has a  long history of chronic venous insufficiency including previous ablation surgery on this side. 5/20-Patient returns for wounds on left leg with both wounds covered in slough, with the lateral leg wound larger in size, she has been in 3 layer compression and felt more comfortable, she describes pain in ankle, in leg and pins and needles in foot, and is about to try Pamelor for this 6/3; wounds on the left lateral and left medial leg. The area medially which is the most recent of the 2 seems to have had the largest increase in dimensions. We have been using Sorbac to try and debride the surface. She has been to see orthopedics they apparently did a plain x-ray that was indeterminant. Diagnosed her with neuropathy and they have ordered an MRI to HALINA, [name]. ([id number]) determine if there is underlying osteomyelitis. This was not high on my thought list but I suppose it is prudent. We have advised her to make an appointment with  vein and vascular in [place]. She has a history of a left greater saphenous and accessory vein ablations I wonder if there is anything else that can be done from a surgical point of view to help in these difficult refractory wounds. We have previously healed this wound on one occasion but it keeps on reopening [medial side] 6/10; deep tissue culture I did last week I think on the left medial wound showed both moderate E. coli and moderate staph aureus [MSSA]. She is going to require antibiotics and I have chosen Augmentin. We have been using Sorbact and we have made better looking wound surface on both sides but certainly no improvement in wound area. She was back in last Friday apparently for a dressing changes the wrap was hurting her outer left ankle. She has not managed to get a hold of vein and vascular in [place]. We are going to have to make her that appointment 6/17; patient is tolerating the Augmentin. She had an MRI that I think was ordered by orthopedic  surgeon this did not show osteomyelitis or an abscess did suggest cellulitis. We have been using Sorbact to the lateral and medial ankles. We have been trying to arrange a follow-up appointment with vein and vascular in [place] or did her original ablations. We apparently an area sent the request to vein and vascular in [facility] 6/24; patient has completed the Augmentin. We do not yet have a vein and vascular appointment in [place]. I am not sure what the issue is here we have asked her to call tomorrow. We are using Sorbact. Making some improvements and especially the medial wound. Both surfaces however look better medial and lateral. 7/1; the patient has been in contact with vein and vascular in [place] but has not yet received an appointment. Using Sorbact we have gradually improve the wound surface with no improvement in surface area. She is approved for Apligraf but the wound surface still is not completely viable. She has not had to come in for a dressing change 7/8; the patient has an appointment with vein and vascular on 7/31 which is a Friday afternoon. She is concerned about getting back here for [place] to dress her wounds. I think it is important to have them goal for her venous reflux/history of ablations etc. to see if anything else can be done. She apparently tested positive for 1 of the blood tests with regards to lupus and saw a rheumatologist. He has raised the issue of vasculitis again. I have had this thought in the past however the evidence seems overwhelming that this is a venous reflux etiology. If the rheumatologist tells me there is clinical and laboratory investigation is positive for lupus I will rethink this. 7/15; the patient's wound surfaces are quite a bit better. The medial area which was her original wound now has no depth although the lateral wound which was the more recent area actually appears larger. Both with viable surfaces which is indeed better. Using  Sorbact. I wanted to use Apligraf on her however there is the issue of the vein and vascular appointment on 7/31 at 2:00 in the afternoon which would not allow her to get back to be rewrapped and they would no doubt remove the graft 7/22; the patient's wound surfaces have moderate amount of debris although generally look better. The lateral one is larger with 2 small satellite areas superiorly. We are waiting for her vein and vascular appointment on 7/31. She has been approved for Apligraf which  I would like to use after th 7/29; wound surfaces have improved no debridement is required we have been using Sorbact. She sees vein and vascular on Friday with this so question of whether anything can be done to lessen the likelihood of recurrence and/or speed the healing of these areas. She is already had previous ablations. She no doubt has severe venous hypertension 8/5-Patient returns at 1 week, she was in [place] for 3 days by her podiatrist, we have been using so backed to the wound, she has increased pain in both the wounds on the left lower leg especially the more distal one on the lateral aspect 8/12-Patient returns at 1 week and she is agreeable to having debridement in both wounds on her left leg today. We have been using Sorbact, and vascular studies were reviewed at last visit 8/19; the patient arrives with her wounds fairly clean and no debridement is required. We have used Sorbact which is really done a nice job in cleaning up these very difficult wound surfaces. The patient saw Dr. Donzetta Matters of vascular surgery on 7/31. He did not feel that there was an arterial component. He felt that her treated greater saphenous vein is adequately addressed and that the small saphenous vein did not appear to be involved significantly. She was also noted to have deep venous reflux which is not treatable. Dr. Donzetta Matters mentioned the possibility of a central obstructive component leading to reflux and he offered her  central venography. She wanted to discuss this or think about it. I have urged her to go ahead with this. She has had recurrent difficult wounds in these areas which do heal but after months in the clinic. If there is anything that can be done to reduce the likelihood of this I think it is worth it. 9/2 she is still working towards getting follow-up with Dr. Donzetta Matters to schedule her CT. Things are quite a bit worse venography. I put Apligraf on 2 weeks ago on both wounds on the medial and lateral part of her left lower leg. She arrives in clinic today with 3 superficial additional wounds above the area laterally and one below the wound medially. She describes a lot of discomfort. I think these are probably wrapped injuries. Does not look like she has cellulitis. 07/20/2019 on evaluation today patient appears to be doing somewhat poorly in regard to her lower extremity ulcers. She in fact showed signs of erythema in fact we may even be dealing with an infection at this time. Unfortunately I am unsure if this is just infection or if indeed there may be some allergic reaction that occurred as a result of the Apligraf application. With that being said that would be unusual but nonetheless not impossible in this patient is one who is unfortunately allergic to quite a bit. Currently we have been using the Sorbact which seems to do as well as anything for her. I do think we may want to obtain a culture today to see if there is anything showing up there that may need to be addressed. EMMILYNN, GARLINGER ([id number]) 9/16; noted that last week the wounds look worse in 1 week follow-up of the Apligraf. Using Sorbact as of 2 days ago. She arrives with copious amounts of drainage and new skin breakdown on the back of the left calf. The wounds arm more substantial bilaterally. There is a fair amount of swelling in the left calf no overt DVT there is edema present I think in the left  greater than right thigh. She is  supposed to go on 9/28 for CT venography. The wounds on the medial and lateral calf are worse and she has new skin breakdown posteriorly at least new for me. This is almost developing into a circumferential wound area The Apligraf was taken off last week which I agree with things are not going in the right direction a culture was done we do not have that back yet. She is on Augmentin that she started 2 days ago 9/23; dressing was changed by her nurses on Monday. In general there is no improvement in the wound areas although the area looks less angry than last week. She did get Augmentin for MSSA cultured on the 14th. She still appears to have too much swelling in the left leg even with 3 layer compression 9/30; the patient underwent her procedure on 9/28 by Dr. Donzetta Matters at [facility]. She was discovered to have the common iliac vein measuring 12.2 mm but at the level of L4-L5 measured 3 mm. After stenting it measured 10 mm. 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 Rouleaux flow in the common femoral and femoral vein was observed much improved after stenting. We are using silver alginate to the wounds on the medial and lateral ankle on the left. 4 layer compression 10/7; the patient had fluid swelling around her knee and 4 layer compression. At the advice of vein and vascular this was reduced to 3 layer which she is tolerating better. We have been using silver alginate under 3 layer compression since last Friday 10/14; arrives with the areas on the left ankle looking a lot better. Inflammation in the area also a lot better. She came in for a nurse check on 10/9 10/21; continued nice improvement. Slight improvements in surface area of both the medial and lateral wounds on the left. A lot of the satellite lesions in the weeping erythema around these from stasis dermatitis is resolved. We have been using silver alginate 10/28; general improvement in the entire wound  areas although not a lot of change in dimensions the wound certainly looks better. There is a lot less in terms of venous inflammation. Continue silver alginate this week however look towards Hydrofera Blue next week 11/4; very adherent debris on the medial wound left wound is not as bad. We have been using silver alginate. Change to [facility] today 11/11; very adherent debris on both wound areas. She went to vein and vascular last week and follow-up they put in Horace boot on this today. He says the [facility] was adherent. Wound is definitely not as good as last week. Especially on the left there the satellite lesions look more prominent 11/18; absolutely no better. erythema on lateral aspect with tenderness. 09/30/2019 on evaluation today patient appears to actually be doing better. Dr. Dellia Nims did put her on doxycycline last week which I do believe has helped her at this point. Fortunately there is no signs of active infection at this time. No fevers, chills, nausea, vomiting, or diarrhea. I do believe he may want extend the doxycycline for 7 additional days just to ensure everything does completely cleared up the patient is in agreement with that plan. Otherwise she is going require some sharp debridement today 12/2; patient is completing a 2-week course of doxycycline. I gave her this empirically for inflammation as well as infection when I last saw her 2 weeks ago. All of this seems to be better. She is  using silver alginate she has the area on the medial aspect of the larger area laterally and the 2 small satellite regions laterally above the major wound. Electronic Signature(s) Signed: 10/07/2019 4:48:24 PM By: Linton Ham MD Entered By: Linton Ham on 10/07/2019 14:52:51 Montroy, [name] ([id number]) -------------------------------------------------------------------------------- Physical Exam Details Patient Name: ALEXANA, PHANOR. Date of Service: 10/07/2019 2:00  PM Medical Record Number: [id number] Patient Account Number: [id number] Date of Birth/Sex: 1945/11/13 (73 y.o. F) Treating RN: Harold Barban Primary Care Provider: [name] Other Clinician: Referring Provider: [name] Treating Provider/Extender: Tito Dine in Treatment: 52 Constitutional Patient is hypertensive.. Pulse regular and within target range for patient.Marland Kitchen Respirations regular, non-labored and within target range.. Temperature is normal and within the target range for the patient.Marland Kitchen appears in no distress. Eyes Conjunctivae clear. No discharge. Cardiovascular Pedal pulses are palpable. Edema control is adequate. Integumentary (Hair, Skin) No erythema around any of the areas. Things look a lot less inflamed. Psychiatric No evidence of depression, anxiety, or agitation. Calm, cooperative, and communicative. Appropriate interactions and affect.. Notes Wound exam; all the wounds required debridement with a #5 curette for adherent fibrinous debris. Post debridement they seem to clean up quite nicely with healthy granulation tissue at least visually. There is no evidence of surrounding infection and as mentioned the erythema is a lot better Electronic Signature(s) Signed: 10/07/2019 4:48:24 PM By: Linton Ham MD Entered By: Linton Ham on 10/07/2019 14:54:07 Tat, [name] ([id number]) -------------------------------------------------------------------------------- Physician Orders Details Patient Name: KHIYA, WESTHOFF. Date of Service: 10/07/2019 2:00 PM Medical Record Number: [id number] Patient Account Number: [id number] Date of Birth/Sex: 16-May-1946 (73 y.o. F) Treating RN: Harold Barban Primary Care Provider: [name] Other Clinician: Referring Provider: [name] Treating Provider/Extender: Tito Dine in Treatment: 36 Verbal / Phone Orders: No Diagnosis Coding Wound Cleansing Wound #5 Left,Medial Lower  Leg o Cleanse wound with mild soap and water Wound #6 Left,Lateral Lower Leg o Cleanse wound with mild soap and water Wound #9 Left,Lateral,Posterior Lower Leg o Cleanse wound with mild soap and water Anesthetic (add to Medication List) Wound #5 Left,Medial Lower Leg o Topical Lidocaine 4% cream applied to wound bed prior to debridement (In Clinic Only). Wound #6 Left,Lateral Lower Leg o Topical Lidocaine 4% cream applied to wound bed prior to debridement (In Clinic Only). Wound #9 Left,Lateral,Posterior Lower Leg o Topical Lidocaine 4% cream applied to wound bed prior to debridement (In Clinic Only). Skin Barriers/Peri-Wound Care Wound #5 Left,Medial Lower Leg o Triamcinolone Acetonide Ointment (TCA) Wound #6 Left,Lateral Lower Leg o Triamcinolone Acetonide Ointment (TCA) Wound #9 Left,Lateral,Posterior Lower Leg o Triamcinolone Acetonide Ointment (TCA) Primary Wound Dressing Wound #5 Left,Medial Lower Leg o Silver Alginate - adaptic for sticking Wound #6 Left,Lateral Lower Leg o Silver Alginate - adaptic for sticking Wound #9 Left,Lateral,Posterior Lower Leg o Silver Alginate - adaptic for sticking Secondary Dressing Wound #5 Left,Medial Lower Leg o Drawtex AYAANA, CHO. ([id number]) Wound #6 Left,Lateral Lower Leg o Drawtex Wound #9 Left,Lateral,Posterior Lower Leg o Drawtex Dressing Change Frequency Wound #5 Left,Medial Lower Leg o Change dressing every week o Other: - as needed. Wound #6 Left,Lateral Lower Leg o Change dressing every week o Other: - as needed. Wound #9 Left,Lateral,Posterior Lower Leg o Change dressing every week o Other: - as needed. Follow-up Appointments Wound #5 Left,Medial Lower Leg o Return Appointment in 1 week. o Nurse Visit as needed - Call of needed Wound #6 Left,Lateral Lower Leg o Return Appointment in 1 week.   o Nurse Visit as needed - Call of needed Wound #9  Left,Lateral,Posterior Lower Leg o Return Appointment in 1 week. o Nurse Visit as needed - Call of needed Edema Control Wound #5 Left,Medial Lower Leg o 3 Layer Compression System - Left Lower Extremity - unna to anchor Wound #6 Left,Lateral Lower Leg o 3 Layer Compression System - Left Lower Extremity - unna to anchor Wound #9 Left,Lateral,Posterior Lower Leg o 3 Layer Compression System - Left Lower Extremity - unna to anchor Medications-please add to medication list. Wound #5 Left,Medial Lower Leg o P.O. Antibiotics - Continue antibiotics Wound #6 Left,Lateral Lower Leg o P.O. Antibiotics - Continue antibiotics Wound #9 Left,Lateral,Posterior Lower Leg o P.O. Antibiotics - Continue antibiotics Electronic Signature(s) GENEIVE, KAWAHARA ([id number]) Signed: 10/07/2019 4:10:47 PM By: Harold Barban Signed: 10/07/2019 4:48:24 PM By: Linton Ham MD Entered By: Harold Barban on 10/07/2019 14:44:03 KELTON, ACEVEDO ([id number]) -------------------------------------------------------------------------------- Problem List Details Patient Name: NEHARIKA, BARBOZA. Date of Service: 10/07/2019 2:00 PM Medical Record Number: [id number] Patient Account Number: [id number] Date of Birth/Sex: 05-Apr-1946 (73 y.o. F) Treating RN: Harold Barban Primary Care Provider: [name] Other Clinician: Referring Provider: [name] Treating Provider/Extender: Tito Dine in Treatment: 68 Active Problems ICD-10 Evaluated Encounter Code Description Active Date Today Diagnosis L97.221 Non-pressure chronic ulcer of left calf limited to breakdown of 01/07/2019 No Yes skin I87.321 Chronic venous hypertension (idiopathic) with inflammation of 12/10/2018 No Yes right lower extremity I89.0 Lymphedema, not elsewhere classified 12/10/2018 No Yes Inactive Problems Resolved Problems Electronic Signature(s) Signed: 10/07/2019 4:48:24 PM By: Linton Ham MD Entered By:  Linton Ham on 10/07/2019 14:50:48 Smolinsky, [name] -------------------------------------------------------------------------------- Progress Note Details Patient Name: AEMILIA, [name]. Date of Service: 10/07/2019 2:00 PM Medical Record Number: [id number] Patient Account Number: [id number] Date of Birth/Sex: 08-31-1946 (73 y.o. F) Treating RN: Harold Barban Primary Care Provider: [name] Other Clinician: Referring Provider: [name] Treating Provider/Extender: Tito Dine in Treatment: 43 Subjective History of Present Illness (HPI) Pleasant 73 year old with history of chronic venous insufficiency. No diabetes or peripheral vascular disease. Left ABI 1.29. Questionable history of left lower extremity DVT. She developed a recurrent ulceration on her left lateral calf in December 2015, which she attributes to poor diet and subsequent lower extremity edema. She underwent endovenous laser ablation of her left greater saphenous vein in 2010. She underwent laser ablation of accessory branch of left GSV in April 2016 by Dr. Kellie Simmering at [facility]. She was previously wearing Unna boots, which she tolerated well. Tolerating 2 layer compression and cadexomer iodine. She returns to clinic for follow-up and is without new complaints. She denies any significant pain at this time. She reports persistent pain with pressure. No claudication or ischemic rest pain. No fever or chills. No drainage. READMISSION 11/13/16; this is a 73 year old woman who is not a diabetic. She is here for a review of a painful area on her left medial lower extremity. I note that she was seen here previously last year for wound I believe to be in the same area. At that time she had undergone previously a left greater saphenous vein ablation by Dr. Kellie Simmering and she had a ablation of the anterior accessory branch of the left greater saphenous vein in March 2016. Seeing that the wound actually  closed over. In reviewing the history with her today the ulcer in this area has been recurrent. She describes a biopsy of this area in 2009 that only showed stasis physiology. She also  has a history of today malignant melanoma in the right shoulder for which she follows with Dr. 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 Do not see that she has ever had arterial studies in the left leg. She tells me she has a follow-up with Dr. Kellie Simmering in roughly 10 days In any case she developed the reopening of this area roughly a month ago. On the background of this she describes rapidly increasing edema which has responded to Lasix 40 mg and metolazone 2.5 mg as well as the patient's lymph massage. She has been told she has both venous insufficiency and lymphedema but she cannot tolerate compression stockings 11/28/16; the patient saw Dr. Kellie Simmering recently. Per the patient he did arterial Dopplers in the office that did not show evidence of arterial insufficiency, per the patient he stated "treat this like an ordinary venous ulcer". She also saw her dermatologist Dr. Ronnald Ramp who felt that this was more of a vascular ulcer. In general things are improving although she arrives today with increasing bilateral lower extremity edema with weeping a deeper fluid through the wound on the left medial leg compatible with some degree of lymphedema 12/04/16; the patient's wound is fully epithelialized but I don't think fully healed. We will do another week of depression with Promogran and TCA however I suspect we'll be able to discharge her next week. This is a very unusual-looking wound which was initially a figure-of-eight type wound lying on its side surrounded by petechial like hemorrhage. She has had venous ablation on this side. She apparently does not have an arterial issue per Dr. Kellie Simmering. She saw her  dermatologist thought it was "vascular". Patient is definitely going to need ongoing compression and I talked about this with her today she will go to elastic therapy after she leaves here next week 12/11/16; the patient's wound is not completely closed today. She has surrounding scar tissue and in further discussion with the patient it would appear that she had ulcers in this area in 2009 for a prolonged period of time ultimately requiring a punch biopsy of this area that only showed venous insufficiency. I did not previously pickup on this part of the history from the patient. 12/18/16; the patient's wound is completely epithelialized. There is no open area here. She has significant bilateral venous insufficiency with secondary lymphedema to a mild-to-moderate degree she does not have compression stockings.. She did not say anything to me when I was in the room, she told our intake nurse that she was still having pain in this area. This isn't unusual recurrent small open area. She is going to go to elastic therapy to obtain compression stockings. 12/25/16; the patient's wound is fully epithelialized. There is no open area here. The patient describes some continued episodic discomfort in this area medial left calf. However everything looks fine and healed here. She is been to elastic therapy and LATICIA, SMIEJA ([id number]) caught herself 15-20 mmHg stockings, they apparently were having trouble getting 20-30 mm stockings in her size 01/22/17; this is a patient we discharged from the clinic a month ago. She has a recurrent open wound on her medial left calf. She had 15 mm support stockings. I told her I thought she needed 20-30 mm compression stockings. She tells me that she has been ill with hospitalization secondary to asthma and is been found to have severe hypokalemia  likely secondary to a combination of Lasix and metolazone. This morning she noted blistering and leaking fluid on the posterior part  of her left leg. She called our intake nurse urgently and we was saw her this afternoon. She has not had any real discomfort here. I don't know that she's been wearing any stockings on this leg for at least 2-3 days. ABIs in this clinic were 1.21 on the right and 1.3 on the left. She is previously seen vascular surgery who does not think that there is a peripheral arterial issue. 01/30/17; Patient arrives with no open wound on the left leg. She has been to elastic therapy and obtained 20-66mmhg below knee stockings and she has one on the right leg today. READMISSION 02/19/18; this Remer is a now 73 year old patient we've had in this clinic perhaps 3 times before. I had last looked at her from January 07 December 2016 with an area on the medial left leg. We discharged her on 12/25/16 however she had to be readmitted on 01/22/17 with a recurrence. I have in my notes that we discharged her on 20-30 mm stockings although she tells me she was only wearing support hose because she cannot get stockings on predominantly related to her cervical spine surgery/issues. She has had previous ablations done by vein and vascular in [place] including a great saphenous vein ablation on the left with an anterior accessory branch ablation I think both of these were in 2016. On one of the previous visit she had a biopsy noted 2009 that was negative. She is not felt to have an arterial issue. She is not a diabetic. She does have a history of obstructive sleep apnea hypertension asthma as well as chronic venous insufficiency and lymphedema. On this occasion she noted 2 dry scaly patch on her left leg. She tried to put lotion on this it didn't really help. There were 2 open areas.the patient has been seeing her primary physician from 02/05/18 through 02/14/18. She had Unna boots applied. The superior wound now on the lateral left leg has closed but she's had one wound that remains open on the lateral left leg. This is not the  same spot as we dealt with in 2018. ABIs in this clinic were 1.3 bilaterally 02/26/18; patient has a small wound on the left lateral calf. Dimensions are down. She has chronic venous insufficiency and lymphedema. 03/05/18; small open area on the left lateral calf. Dimensions are down. Tightly adherent necrotic debris over the surface of the wound which was difficult to remove. Also the dressing [over collagen] stuck to the wound surface. This was removed with some difficulty as well. Change the primary dressing to Hydrofera Blue ready 03/12/18; small open area on the left lateral calf. Comes in with tightly adherent surface eschar as well as some adherent Hydrofera Blue. 03/19/18; open area on the left lateral calf. Again adherent surface eschar as well as some adherent Hydrofera Blue nonviable subcutaneous tissue. She complained of pain all week even with the reduction from 4-3 layer compression I put on last week. Also she had an increase in her ankle and calf measurements probably related to the same thing. 03/26/18; open area on the left lateral calf. A very small open area remains here. We used silver alginate starting last week as the Hydrofera Blue seem to stick to the wound bed. In using 4-layer compression 04/02/18; the open area in the left lateral calf at some adherent slough which I removed there is no open  area here. We are able to transition her into her own compression stocking. Truthfully I think this is probably his support hose. However this does not maintain skin integrity will be limited. She cannot put over the toe compression stockings on because of neck problems hand problems etc. She is allergic to the lining layer of juxta lites. We might be forced to use extremitease stocking should this fail READMIT 11/24/2018 Patient is now a 73 year old woman who is not a diabetic. She has been in this clinic on at least 3 previous occasions largely with recurrent wounds on her left leg  secondary to chronic venous insufficiency with secondary lymphedema. Her situation is complicated by inability to get stockings on and an allergy to neoprene which is apparently a component and at least juxta lites and other stockings. As a result she really has not been wearing any stockings on her legs. She tells [place] that roughly 2 or 3 weeks ago she started noticing a stinging sensation just above her ankle on the left medial aspect. She has been diagnosed with pseudogout and she wondered whether this was what she was experiencing. She tried to dress this with something she bought at the store however subsequently it pulled skin off and now she has an open wound that is not improving. She has been using Vaseline gauze with a cover bandage. She saw her primary doctor last week who put an Haematologist on her. ABIs in this clinic was 1.03 on the left [name], [name]. ([id number]) 2/12; the area is on the left medial ankle. Odd-looking wound with what looks to be surface epithelialization but a multitude of small petechial openings. This clearly not closed yet. We have been using silver alginate under 3 layer compression with TCA 2/19; the wound area did not look quite as good this week. Necrotic debris over the majority of the wound surface which required debridement. She continues to have a multitude of what looked to be small petechial openings. She reminds [place] that she had a biopsy on this initially during her first outbreak in 2015 in [place] dermatology. She expresses concern about this being a possible melanoma. 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 I assured her that this does not look anything like melanoma. Except for the petechial reaction it does look like a venous insufficiency area and she certainly has evidence of this on both sides 2/26; a difficult area on the left medial ankle. The patient  clearly has chronic venous hypertension with some degree of lymphedema. The odd thing about the area is the small petechial hemorrhages. I am not really sure how to explain this. This was present last time and this is not a compression injury. We have been using Hydrofera Blue which I changed to last week 3/4; still using Hydrofera Blue. Aggressive debridement today. She does not have known arterial issues. She has seen Dr. Kellie Simmering at [facility] vein and vascular and and has an ablation on the left. [Anterior accessory branch of the greater saphenous]. From what I remember they did not feel she had an arterial issue. The patient has had this area biopsied in 2009 at [facility] dermatology and by her recollection they said this was "stasis". She is also follow-up with dermatology locally who thought that this was more of a vascular issue 3/11; using Hydrofera Blue. Aggressive debridement today. She does not have an arterial issue. We are using 3 layer  compression although we may need to go to 4. The patient has been in for multiple changes to her wrap since I last saw her a week ago. She says that the area was leaking. I do not have too much more information on what was found 01/19/19 on evaluation today patient was actually being seen for a nurse visit when unfortunately she had the area on her left lateral lower extremity as well as weeping from the right lower extremity that became apparent. Therefore we did end up actually seeing her for a full visit with myself. She is having some pain at this site as well but fortunately nothing too significant at this point. No fevers, chills, nausea, or vomiting noted at this time. 3/18-Patient is back to the clinic with the left leg venous leg ulcer, the ulcer is larger in size, has a surface that is densely adherent with fibrinous tissue, the Hydrofera Blue was used but is densely adherent and there was difficulty in removing it. The right lower extremity was  also wrapped for weeping edema. Patient has a new area over the left lateral foot above the malleolus that is small and appears to have no debris with intact surrounding skin. Patient is on increased dose of Lasix also as a means to edema management 3/25; the patient has a nonhealing venous ulcer on the medial left leg and last week developed a smaller area on the lateral left calf. We have been using Hydrofera Blue with a contact layer. 4/1; no major change in these wounds areas. Left medial and more recently left lateral calf. I tried Iodoflex last week to aid in debridement she did not tolerate this. She stated her pain was terrible all week. She took the top layer of the 4 layer compression off. 4/8; the patient actually looks somewhat better in terms of her more prominent left lateral calf wound. There is some healthy looking tissue here. She is still complaining of a lot of discomfort. 4/15; patient in a lot of pain secondary to sciatica. She is on a prednisone taper prescribed by her primary physician. She has the 2 areas one on the left medial and more recently a smaller area on the left lateral calf. Both of these just above the malleoli 4/22; her back pain is better but she still states she is very uncomfortable and now feels she is intolerant to the [employer]. No real change in the wounds we have been using Sorbact. She has been previously intolerant to Iodoflex. There is not a lot of option about what we can use to debride this wound under compression that she no doubt needs. sHe states Ultram no longer works for her pain 4/29; no major change in the wounds slightly increased depth. Surface on the original medial wound perhaps somewhat improved however the more recent area on the lateral left ankle is 100% covered in very adherent debris we have been using Sorbact. She tolerates 4 layer compression well and her edema control is a lot better. She has not had to come in for a  nurse check 5/6; no major change in the condition of the wounds. She did consent to debridement today which was done with some difficulty. Continuing Sorbact. She did not tolerate Iodoflex. She was in for a check of her compression the day after we wrapped her last week this was adjusted but nothing much was found 5/13; no major change in the condition or area of the wounds. I was able to get  a fairly aggressive debridement done on the lateral left leg wound. Even using Sorbact under compression. She came back in on Friday to have the wrap changed. She says she felt uncomfortable on the lateral aspect of her ankle. She has a long history of chronic venous insufficiency including previous ablation surgery on this side. 5/20-Patient returns for wounds on left leg with both wounds covered in slough, with the lateral leg wound larger in size, she has been in 3 layer compression and felt more comfortable, she describes pain in ankle, in leg and pins and needles in foot, and is about to try Pamelor for this 6/3; wounds on the left lateral and left medial leg. The area medially which is the most recent of the 2 seems to have had the largest increase in dimensions. We have been using Sorbac to try and debride the surface. She has been to see orthopedics [place], SANDOVAL ([id number]) they apparently did a plain x-ray that was indeterminant. Diagnosed her with neuropathy and they have ordered an MRI to determine if there is underlying osteomyelitis. This was not high on my thought list but I suppose it is prudent. We have advised her to make an appointment with vein and vascular in [name]. She has a history of a left greater saphenous and accessory vein ablations I wonder if there is anything else that can be done from a surgical point of view to help in these difficult refractory wounds. We have previously healed this wound on one occasion but it keeps on reopening [medial side] 6/10; deep tissue  culture I did last week I think on the left medial wound showed both moderate E. coli and moderate staph aureus [MSSA]. She is going to require antibiotics and I have chosen Augmentin. We have been using Sorbact and we have made better looking wound surface on both sides but certainly no improvement in wound area. She was back in last Friday apparently for a dressing changes the wrap was hurting her outer left ankle. She has not managed to get a hold of vein and vascular in [place]. We are going to have to make her that appointment 6/17; patient is tolerating the Augmentin. She had an MRI that I think was ordered by orthopedic surgeon this did not show osteomyelitis or an abscess did suggest cellulitis. We have been using Sorbact to the lateral and medial ankles. We have been trying to arrange a follow-up appointment with vein and vascular in [place] or did her original ablations. We apparently an area sent the request to vein and vascular in [facility] 6/24; patient has completed the Augmentin. We do not yet have a vein and vascular appointment in [place]. I am not sure what the issue is here we have asked her to call tomorrow. We are using Sorbact. Making some improvements and especially the medial wound. Both surfaces however look better medial and lateral. 7/1; the patient has been in contact with vein and vascular in [place] but has not yet received an appointment. Using Sorbact we have gradually improve the wound surface with no improvement in surface area. She is approved for Apligraf but the wound surface still is not completely viable. She has not had to come in for a dressing change 7/8; the patient has an appointment with vein and vascular on 7/31 which is a Friday afternoon. She is concerned about getting back here for [place] to dress her wounds. I think it is important to have them goal for her venous reflux/history of  ablations etc. to see if anything else can be done. She  apparently tested positive for 1 of the blood tests with regards to lupus and saw a rheumatologist. He has raised the issue of vasculitis again. I have had this thought in the past however the evidence seems overwhelming that this is a venous reflux etiology. If the rheumatologist tells me there is clinical and laboratory investigation is positive for lupus I will rethink this. 7/15; the patient's wound surfaces are quite a bit better. The medial area which was her original wound now has no depth although the lateral wound which was the more recent area actually appears larger. Both with viable surfaces which is indeed better. Using Sorbact. I wanted to use Apligraf on her however there is the issue of the vein and vascular appointment on 7/31 at 2:00 in the afternoon which would not allow her to get back to be rewrapped and they would no doubt remove the graft 7/22; the patient's wound surfaces have moderate amount of debris although generally look better. The lateral one is larger with 2 small satellite areas superiorly. We are waiting for her vein and vascular appointment on 7/31. She has been approved for Apligraf which I would like to use after th 7/29; wound surfaces have improved no debridement is required we have been using Sorbact. She sees vein and vascular on Friday with this so question of whether anything can be done to lessen the likelihood of recurrence and/or speed the healing of these areas. She is already had previous ablations. She no doubt has severe venous hypertension 8/5-Patient returns at 1 week, she was in [place] for 3 days by her podiatrist, we have been using so backed to the wound, she has increased pain in both the wounds on the left lower leg especially the more distal one on the lateral aspect 8/12-Patient returns at 1 week and she is agreeable to having debridement in both wounds on her left leg today. We have been using Sorbact, and vascular studies were reviewed  at last visit 8/19; the patient arrives with her wounds fairly clean and no debridement is required. We have used Sorbact which is really done a nice job in cleaning up these very difficult wound surfaces. The patient saw Dr. Donzetta Matters of vascular surgery on 7/31. He did not feel that there was an arterial component. He felt that her treated greater saphenous vein is adequately addressed and that the small saphenous vein did not appear to be involved significantly. She was also noted to have deep venous reflux which is not treatable. Dr. Donzetta Matters mentioned the possibility of a central obstructive component leading to reflux and he offered her central venography. She wanted to discuss this or think about it. I have urged her to go ahead with this. She has had recurrent difficult wounds in these areas which do heal but after months in the clinic. If there is anything that can be done to reduce the likelihood of this I think it is worth it. 9/2 she is still working towards getting follow-up with Dr. Donzetta Matters to schedule her CT. Things are quite a bit worse venography. I put Apligraf on 2 weeks ago on both wounds on the medial and lateral part of her left lower leg. She arrives in clinic today with 3 superficial additional wounds above the area laterally and one below the wound medially. She describes a lot of discomfort. I think these are probably wrapped injuries. Does not look like she has  cellulitis. 07/20/2019 on evaluation today patient appears to be doing somewhat poorly in regard to her lower extremity ulcers. She in fact showed signs of erythema in fact we may even be dealing with an infection at this time. Unfortunately I am unsure if this is just infection or if indeed there may be some allergic reaction that occurred as a result of the Apligraf application. With that being said that would be unusual but nonetheless not impossible in this patient is one who is unfortunately allergic to quite a bit.  Currently we have been using the Sorbact which seems to do as well as anything for her. I do think we may want to obtain JAYE, HAUSSMANN ([id number]) a culture today to see if there is anything showing up there that may need to be addressed. 9/16; noted that last week the wounds look worse in 1 week follow-up of the Apligraf. Using Sorbact as of 2 days ago. She arrives with copious amounts of drainage and new skin breakdown on the back of the left calf. The wounds arm more substantial bilaterally. There is a fair amount of swelling in the left calf no overt DVT there is edema present I think in the left greater than right thigh. She is supposed to go on 9/28 for CT venography. The wounds on the medial and lateral calf are worse and she has new skin breakdown posteriorly at least new for me. This is almost developing into a circumferential wound area The Apligraf was taken off last week which I agree with things are not going in the right direction a culture was done we do not have that back yet. She is on Augmentin that she started 2 days ago 9/23; dressing was changed by her nurses on Monday. In general there is no improvement in the wound areas although the area looks less angry than last week. She did get Augmentin for MSSA cultured on the 14th. She still appears to have too much swelling in the left leg even with 3 layer compression 9/30; the patient underwent her procedure on 9/28 by Dr. Donzetta Matters at vascular and vein specialist. She was discovered to have the common iliac vein measuring 12.2 mm but at the level of L4-L5 measured 3 mm. After stenting it measured 10 mm. 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 Rouleaux flow in the common femoral and femoral vein was observed much improved after stenting. We are using silver alginate to the wounds on the medial and lateral ankle on the left. 4 layer compression 10/7; the patient had fluid swelling around her knee and 4 layer  compression. At the advice of vein and vascular this was reduced to 3 layer which she is tolerating better. We have been using silver alginate under 3 layer compression since last Friday 10/14; arrives with the areas on the left ankle looking a lot better. Inflammation in the area also a lot better. She came in for a nurse check on 10/9 10/21; continued nice improvement. Slight improvements in surface area of both the medial and lateral wounds on the left. A lot of the satellite lesions in the weeping erythema around these from stasis dermatitis is resolved. We have been using silver alginate 10/28; general improvement in the entire wound areas although not a lot of change in dimensions the wound certainly looks better. There is a lot less in terms of venous inflammation. Continue silver alginate this week however look towards Hydrofera Blue next week 11/4; very adherent  debris on the medial wound left wound is not as bad. We have been using silver alginate. Change to [facility] today 11/11; very adherent debris on both wound areas. She went to vein and vascular last week and follow-up they put in [place] boot on this today. He says the [facility] was adherent. Wound is definitely not as good as last week. Especially on the left there the satellite lesions look more prominent 11/18; absolutely no better. erythema on lateral aspect with tenderness. 09/30/2019 on evaluation today patient appears to actually be doing better. Dr. Dellia Nims did put her on doxycycline last week which I do believe has helped her at this point. Fortunately there is no signs of active infection at this time. No fevers, chills, nausea, vomiting, or diarrhea. I do believe he may want extend the doxycycline for 7 additional days just to ensure everything does completely cleared up the patient is in agreement with that plan. Otherwise she is going require some sharp debridement today 12/2; patient is completing a 2-week  course of doxycycline. I gave her this empirically for inflammation as well as infection when I last saw her 2 weeks ago. All of this seems to be better. She is using silver alginate she has the area on the medial aspect of the larger area laterally and the 2 small satellite regions laterally above the major wound. Objective Constitutional Patient is hypertensive.. Pulse regular and within target range for patient.Marland Kitchen Respirations regular, non-labored and within target range.. Temperature is normal and within the target range for the patient.Marland Kitchen appears in no distress. KELLEE, MCKOY ([id number]) Vitals Time Taken: 1:53 PM, Height: 63 in, Weight: 224.7 lbs, BMI: 39.8, Temperature: 98.1 F, Pulse: 86 bpm, Respiratory Rate: 16 breaths/min, Blood Pressure: 148/72 mmHg. Eyes Conjunctivae clear. No discharge. Cardiovascular Pedal pulses are palpable. Edema control is adequate. Psychiatric No evidence of depression, anxiety, or agitation. Calm, cooperative, and communicative. Appropriate interactions and affect.. General Notes: Wound exam; all the wounds required debridement with a #5 curette for adherent fibrinous debris. Post debridement they seem to clean up quite nicely with healthy granulation tissue at least visually. There is no evidence of surrounding infection and as mentioned the erythema is a lot better Integumentary (Hair, Skin) No erythema around any of the areas. Things look a lot less inflamed. Wound #5 status is Open. Original cause of wound was Gradually Appeared. The wound is located on the Left,Medial Lower Leg. The wound measures 2.8cm length x 3.5cm width x 0.2cm depth; 7.697cm^2 area and 1.539cm^3 volume. There is Fat Layer (Subcutaneous Tissue) Exposed exposed. There is no tunneling or undermining noted. There is a large amount of serosanguineous drainage noted. The wound margin is flat and intact. There is medium (34-66%) pale granulation within the wound bed. There is a  medium (34-66%) amount of necrotic tissue within the wound bed including Adherent Slough. Wound #6 status is Open. Original cause of wound was Gradually Appeared. The wound is located on the Left,Lateral Lower Leg. The wound measures 4cm length x 3cm width x 0.2cm depth; 9.425cm^2 area and 1.885cm^3 volume. There is Fat Layer (Subcutaneous Tissue) Exposed exposed. There is no tunneling or undermining noted. There is a large amount of serosanguineous drainage noted. The wound margin is flat and intact. There is medium (34-66%) pink granulation within the wound bed. There is a medium (34-66%) amount of necrotic tissue within the wound bed including Adherent Slough. Wound #9 status is Open. Original cause of wound was Gradually Appeared. The wound  is located on the Left,Lateral,Posterior Lower Leg. The wound measures 0.8cm length x 1cm width x 0.1cm depth; 0.628cm^2 area and 0.063cm^3 volume. There is Fat Layer (Subcutaneous Tissue) Exposed exposed. There is no tunneling or undermining noted. There is a large amount of serosanguineous drainage noted. The wound margin is flat and intact. There is medium (34-66%) pink granulation within the wound bed. There is a medium (34-66%) amount of necrotic tissue within the wound bed including Adherent Slough. Assessment Active Problems ICD-10 Non-pressure chronic ulcer of left calf limited to breakdown of skin Chronic venous hypertension (idiopathic) with inflammation of right lower extremity Lymphedema, not elsewhere classified BERKELEY, CARDIEL. ([id number]) Procedures Wound #5 Pre-procedure diagnosis of Wound #5 is a Lymphedema located on the Left,Medial Lower Leg . There was a Excisional Skin/Subcutaneous Tissue Debridement with a total area of 9.8 sq cm performed by [name] Dillon, MD. With the following instrument(s): Curette to remove Viable and Non-Viable tissue/material. Material removed includes Subcutaneous Tissue and Slough and after achieving  pain control using Lidocaine. No specimens were taken. A time out was conducted at 14:37, prior to the start of the procedure. A Minimum amount of bleeding was controlled with Pressure. The procedure was tolerated well with a pain level of 3 throughout and a pain level of 0 following the procedure. Post Debridement Measurements: 2.8cm length x 3.5cm width x 0.2cm depth; 1.539cm^3 volume. Character of Wound/Ulcer Post Debridement is improved. Post procedure Diagnosis Wound #5: Same as Pre-Procedure Wound #6 Pre-procedure diagnosis of Wound #6 is a Venous Leg Ulcer located on the Left,Lateral Lower Leg .Severity of Tissue Pre Debridement is: Fat layer exposed. There was a Excisional Skin/Subcutaneous Tissue Debridement with a total area of 12 sq cm performed by [name] Dillon, MD. With the following instrument(s): Curette to remove Viable and Non-Viable tissue/material. Material removed includes Subcutaneous Tissue and Slough and after achieving pain control using Lidocaine. No specimens were taken. A time out was conducted at 14:37, prior to the start of the procedure. A Minimum amount of bleeding was controlled with Pressure. The procedure was tolerated well with a pain level of 3 throughout and a pain level of 0 following the procedure. Post Debridement Measurements: 4cm length x 3cm width x 0.2cm depth; 1.885cm^3 volume. Character of Wound/Ulcer Post Debridement is improved. Severity of Tissue Post Debridement is: Fat layer exposed. Post procedure Diagnosis Wound #6: Same as Pre-Procedure Wound #9 Pre-procedure diagnosis of Wound #9 is a Venous Leg Ulcer located on the Left,Lateral,Posterior Lower Leg .Severity of Tissue Pre Debridement is: Fat layer exposed. There was a Excisional Skin/Subcutaneous Tissue Debridement with a total area of 0.8 sq cm performed by [name] Dillon, MD. With the following instrument(s): Curette to remove Viable and Non-Viable tissue/material. Material removed  includes Subcutaneous Tissue and Slough and after achieving pain control using Lidocaine. No specimens were taken. A time out was conducted at 14:37, prior to the start of the procedure. A Minimum amount of bleeding was controlled with Pressure. The procedure was tolerated well with a pain level of 3 throughout and a pain level of 0 following the procedure. Post Debridement Measurements: 0.8cm length x 1.4cm width x 0.1cm depth; 0.088cm^3 volume. Character of Wound/Ulcer Post Debridement is improved. Severity of Tissue Post Debridement is: Fat layer exposed. Post procedure Diagnosis Wound #9: Same as Pre-Procedure Plan Wound Cleansing: Wound #5 Left,Medial Lower Leg: Cleanse wound with mild soap and water Wound #6 Left,Lateral Lower Leg: Cleanse wound with mild soap and water Wound #9  Left,Lateral,Posterior Lower Leg: Cleanse wound with mild soap and water Anesthetic (add to Medication List): Wound #5 Left,Medial Lower Leg: Topical Lidocaine 4% cream applied to wound bed prior to debridement (In Clinic Only). Wound #6 Left,Lateral Lower Leg: Topical Lidocaine 4% cream applied to wound bed prior to debridement (In Clinic Only). JETAIME, MATHIEU ([id number]) Wound #9 Left,Lateral,Posterior Lower Leg: Topical Lidocaine 4% cream applied to wound bed prior to debridement (In Clinic Only). Skin Barriers/Peri-Wound Care: Wound #5 Left,Medial Lower Leg: Triamcinolone Acetonide Ointment (TCA) Wound #6 Left,Lateral Lower Leg: Triamcinolone Acetonide Ointment (TCA) Wound #9 Left,Lateral,Posterior Lower Leg: Triamcinolone Acetonide Ointment (TCA) Primary Wound Dressing: Wound #5 Left,Medial Lower Leg: Silver Alginate - adaptic for sticking Wound #6 Left,Lateral Lower Leg: Silver Alginate - adaptic for sticking Wound #9 Left,Lateral,Posterior Lower Leg: Silver Alginate - adaptic for sticking Secondary Dressing: Wound #5 Left,Medial Lower Leg: Drawtex Wound #6 Left,Lateral Lower  Leg: Drawtex Wound #9 Left,Lateral,Posterior Lower Leg: Drawtex Dressing Change Frequency: Wound #5 Left,Medial Lower Leg: Change dressing every week Other: - as needed. Wound #6 Left,Lateral Lower Leg: Change dressing every week Other: - as needed. Wound #9 Left,Lateral,Posterior Lower Leg: Change dressing every week Other: - as needed. Follow-up Appointments: Wound #5 Left,Medial Lower Leg: Return Appointment in 1 week. Nurse Visit as needed - Call of needed Wound #6 Left,Lateral Lower Leg: Return Appointment in 1 week. Nurse Visit as needed - Call of needed Wound #9 Left,Lateral,Posterior Lower Leg: Return Appointment in 1 week. Nurse Visit as needed - Call of needed Edema Control: Wound #5 Left,Medial Lower Leg: 3 Layer Compression System - Left Lower Extremity - unna to anchor Wound #6 Left,Lateral Lower Leg: 3 Layer Compression System - Left Lower Extremity - unna to anchor Wound #9 Left,Lateral,Posterior Lower Leg: 3 Layer Compression System - Left Lower Extremity - unna to anchor Medications-please add to medication list.: Wound #5 Left,Medial Lower Leg: P.O. Antibiotics - Continue antibiotics Wound #6 Left,Lateral Lower Leg: P.O. Antibiotics - Continue antibiotics Wound #9 Left,Lateral,Posterior Lower Leg: P.O. Antibiotics - Continue antibiotics MARINAH, LOVEL ([id number]) 1. TCA around the wound areas 2. Silver alginate over Adaptic she has previously had issues with wound sticking 3. She completes her antibiotics on Friday no further antibiotics for now although things look a lot better. 4. We will consider changing the dressings if the patient's wound does not change. Right now everything looks like it is on the right track although I had like to see some improvement in surface area 5. Sees Dr[facility] Donzetta [facility] Electronic Signature(s) Signed: 10/07/2019 4:48:24 PM By: Linton Ham MD Entered By: Linton Ham on 10/07/2019  Horace, [name] -------------------------------------------------------------------------------- SuperBill Details Patient Name: JULITA, [name]. Date of Service: 10/07/2019 Medical Record Number: [id number] Patient Account Number: [id number] Date of Birth/Sex: 1946/08/12 (73 y.o. F) Treating RN: Harold Barban Primary Care Provider: [name] Other Clinician: Referring Provider: [name] Treating Provider/Extender: Tito Dine in Treatment: 43 Diagnosis Coding ICD-10 Codes Code Description [phone] Non-pressure chronic ulcer of left calf limited to breakdown of skin I87.321 Chronic venous hypertension (idiopathic) with inflammation of right lower extremity I89.0 Lymphedema, not elsewhere classified Facility Procedures CPT4 Code Description: [id number] - DEB SUBQ TISSUE 20 SQ CM/< ICD-10 Diagnosis Description L97.221 Non-pressure chronic ulcer of left calf limited to breakdown I87.321 Chronic venous hypertension (idiopathic) with inflammation of Modifier: of skin right lower ext Quantity: 1 remity CPT4 Code Description: [id number] - DEB SUBQ TISS EA ADDL 20CM ICD-10  Diagnosis Description L97.221 Non-pressure chronic ulcer of left calf limited to breakdown I87.321 Chronic venous hypertension (idiopathic) with inflammation of Modifier: of skin right lower ext Quantity: 1 remity Physician Procedures CPT4 Code Description: [id number] - WC PHYS SUBQ TISS 20 SQ CM ICD-10 Diagnosis Description L97.221 Non-pressure chronic ulcer of left calf limited to breakdown o I87.321 Chronic venous hypertension (idiopathic) with inflammation of Modifier: f skin right lower extr Quantity: 1 emity CPT4 Code Description: [id number] - WC PHYS SUBQ TISS EA ADDL 20 CM ICD-10 Diagnosis Description L97.221 Non-pressure chronic ulcer of left calf limited to breakdown o I87.321 Chronic venous hypertension (idiopathic) with inflammation of Modifier: f skin  right lower extr Quantity: 1 emity Electronic Signature(s) Signed: 10/07/2019 4:48:24 PM By: Linton Ham MD Entered By: Linton Ham on 10/07/2019 14:56:33

## 2019-10-08 ENCOUNTER — Ambulatory Visit (INDEPENDENT_AMBULATORY_CARE_PROVIDER_SITE_OTHER): Payer: Medicare Other

## 2019-10-08 DIAGNOSIS — Z0000 Encounter for general adult medical examination without abnormal findings: Secondary | ICD-10-CM

## 2019-10-08 NOTE — Patient Instructions (Signed)
[name] , Thank you for taking time to come for your Medicare Wellness Visit. I appreciate your ongoing commitment to your health goals. Please review the following plan we discussed and let me know if I can assist you in the future.   Screening recommendations/referrals: Colonoscopy: Up to date, completed 01/06/2018 Mammogram: declined- will wait until pandemic is under control Bone Density: Up to date, completed 06/18/2016 Recommended yearly ophthalmology/optometry visit for glaucoma screening and checkup Recommended yearly dental visit for hygiene and checkup  Vaccinations: Influenza vaccine: will get at physical Pneumococcal vaccine: Completed series Tdap vaccine: decline Shingles vaccine: decline    Advanced directives: Advance directive discussed with you today. I have provided a copy for you to complete at home and have notarized. Once this is complete please bring a copy in to our office so we can scan it into your chart.  Conditions/risks identified: hypertension  Next appointment: 10/14/2019 @ 11:30 am    Preventive Care 65 Years and Older, Female Preventive care refers to lifestyle choices and visits with your health care provider that can promote health and wellness. What does preventive care include?  A yearly physical exam. This is also called an annual well check.  Dental exams once or twice a year.  Routine eye exams. Ask your health care provider how often you should have your eyes checked.  Personal lifestyle choices, including:  Daily care of your teeth and gums.  Regular physical activity.  Eating a healthy diet.  Avoiding tobacco and drug use.  Limiting alcohol use.  Practicing safe sex.  Taking low-dose aspirin every day.  Taking vitamin and mineral supplements as recommended by your health care provider. What happens during an annual well check? The services and screenings done by your health care provider during your annual well check will  depend on your age, overall health, lifestyle risk factors, and family history of disease. Counseling  Your health care provider may ask you questions about your:  Alcohol use.  Tobacco use.  Drug use.  Emotional well-being.  Home and relationship well-being.  Sexual activity.  Eating habits.  History of falls.  Memory and ability to understand (cognition).  Work and work Statistician.  Reproductive health. Screening  You may have the following tests or measurements:  Height, weight, and BMI.  Blood pressure.  Lipid and cholesterol levels. These may be checked every 5 years, or more frequently if you are over 63 years old.  Skin check.  Lung cancer screening. You may have this screening every year starting at age 5 if you have a 30-pack-year history of smoking and currently smoke or have quit within the past 15 years.  Fecal occult blood test (FOBT) of the stool. You may have this test every year starting at age 57.  Flexible sigmoidoscopy or colonoscopy. You may have a sigmoidoscopy every 5 years or a colonoscopy every 10 years starting at age 46.  Hepatitis C blood test.  Hepatitis B blood test.  Sexually transmitted disease (STD) testing.  Diabetes screening. This is done by checking your blood sugar (glucose) after you have not eaten for a while (fasting). You may have this done every 1-3 years.  Bone density scan. This is done to screen for osteoporosis. You may have this done starting at age 55.  Mammogram. This may be done every 1-2 years. Talk to your health care provider about how often you should have regular mammograms. Talk with your health care provider about your test results, treatment options, and  if necessary, the need for more tests. Vaccines  Your health care provider may recommend certain vaccines, such as:  Influenza vaccine. This is recommended every year.  Tetanus, diphtheria, and acellular pertussis (Tdap, Td) vaccine. You may need a  Td booster every 10 years.  Zoster vaccine. You may need this after age 77.  Pneumococcal 13-valent conjugate (PCV13) vaccine. One dose is recommended after age 44.  Pneumococcal polysaccharide (PPSV23) vaccine. One dose is recommended after age 68. Talk to your health care provider about which screenings and vaccines you need and how often you need them. This information is not intended to replace advice given to you by your health care provider. Make sure you discuss any questions you have with your health care provider. Document Released: 11/18/2015 Document Revised: 07/11/2016 Document Reviewed: 08/23/2015 Elsevier Interactive Patient Education  2017 [place] Prevention in the Home Falls can cause injuries. They can happen to people of all ages. There are many things you can do to make your home safe and to help prevent falls. What can I do on the outside of my home?  Regularly fix the edges of walkways and driveways and fix any cracks.  Remove anything that might make you trip as you walk through a door, such as a raised step or threshold.  Trim any bushes or trees on the path to your home.  Use bright outdoor lighting.  Clear any walking paths of anything that might make someone trip, such as rocks or tools.  Regularly check to see if handrails are loose or broken. Make sure that both sides of any steps have handrails.  Any raised decks and porches should have guardrails on the edges.  Have any leaves, snow, or ice cleared regularly.  Use sand or salt on walking paths during winter.  Clean up any spills in your garage right away. This includes oil or grease spills. What can I do in the bathroom?  Use night lights.  Install grab bars by the toilet and in the tub and shower. Do not use towel bars as grab bars.  Use non-skid mats or decals in the tub or shower.  If you need to sit down in the shower, use a plastic, non-slip stool.  Keep the floor dry. Clean  up any water that spills on the floor as soon as it happens.  Remove soap buildup in the tub or shower regularly.  Attach bath mats securely with double-sided non-slip rug tape.  Do not have throw rugs and other things on the floor that can make you trip. What can I do in the bedroom?  Use night lights.  Make sure that you have a light by your bed that is easy to reach.  Do not use any sheets or blankets that are too big for your bed. They should not hang down onto the floor.  Have a firm chair that has side arms. You can use this for support while you get dressed.  Do not have throw rugs and other things on the floor that can make you trip. What can I do in the kitchen?  Clean up any spills right away.  Avoid walking on wet floors.  Keep items that you use a lot in easy-to-reach places.  If you need to reach something above you, use a strong step stool that has a grab bar.  Keep electrical cords out of the way.  Do not use floor polish or wax that makes floors slippery. If you  must use wax, use non-skid floor wax.  Do not have throw rugs and other things on the floor that can make you trip. What can I do with my stairs?  Do not leave any items on the stairs.  Make sure that there are handrails on both sides of the stairs and use them. Fix handrails that are broken or loose. Make sure that handrails are as long as the stairways.  Check any carpeting to make sure that it is firmly attached to the stairs. Fix any carpet that is loose or worn.  Avoid having throw rugs at the top or bottom of the stairs. If you do have throw rugs, attach them to the floor with carpet tape.  Make sure that you have a light switch at the top of the stairs and the bottom of the stairs. If you do not have them, ask someone to add them for you. What else can I do to help prevent falls?  Wear shoes that:  Do not have high heels.  Have rubber bottoms.  Are comfortable and fit you well.  Are  closed at the toe. Do not wear sandals.  If you use a stepladder:  Make sure that it is fully opened. Do not climb a closed stepladder.  Make sure that both sides of the stepladder are locked into place.  Ask someone to hold it for you, if possible.  Clearly mark and make sure that you can see:  Any grab bars or handrails.  First and last steps.  Where the edge of each step is.  Use tools that help you move around (mobility aids) if they are needed. These include:  Canes.  Walkers.  Scooters.  Crutches.  Turn on the lights when you go into a dark area. Replace any light bulbs as soon as they burn out.  Set up your furniture so you have a clear path. Avoid moving your furniture around.  If any of your floors are uneven, fix them.  If there are any pets around you, be aware of where they are.  Review your medicines with your doctor. Some medicines can make you feel dizzy. This can increase your chance of falling. Ask your doctor what other things that you can do to help prevent falls. This information is not intended to replace advice given to you by your health care provider. Make sure you discuss any questions you have with your health care provider. Document Released: 08/18/2009 Document Revised: 03/29/2016 Document Reviewed: 11/26/2014 Elsevier Interactive Patient Education  2017 [employer].

## 2019-10-08 NOTE — Progress Notes (Signed)
PCP notes:  Health Maintenance: Needs flu vaccine Decline shingrix Decline mammogram- will wait until pandemic is under control   Abnormal Screenings: none   Patient concerns: none   Nurse concerns: none   Next PCP appt.: 10/14/2019 @ 11:30 am

## 2019-10-08 NOTE — Progress Notes (Addendum)
Subjective:   [name] is a 73 y.o. female who presents for Medicare Annual (Subsequent) preventive examination.  Review of Systems: N/A   This visit is being conducted through telemedicine via telephone at the nurse health advisor's home address due to the COVID-19 pandemic. This patient has given me verbal consent via doximity to conduct this visit, patient states they are participating from their home address. Patient and myself are on the telephone call. There is no referral for this visit. Some vital signs may be absent or patient reported.    Patient identification: identified by name, DOB, and current address   Cardiac Risk Factors include: advanced age (>11men, >92 women);hypertension     Objective:     Vitals: There were no vitals taken for this visit.  There is no height or weight on file to calculate BMI.  Advanced Directives 10/08/2019 08/03/2019 06/05/2019 10/01/2018 08/14/2018 08/01/2018 02/13/2018  Does Patient Have a Medical Advance Directive? No No No No No No No  Does patient want to make changes to medical advance directive? - - - - - - -  Copy of [place] in Chart? - - - - - - -  Would patient like information on creating a medical advance directive? Yes (MAU/Ambulatory/Procedural Areas - Information given) No - Patient declined No - Patient declined No - Patient declined - No - Patient declined -    Tobacco Social History   Tobacco Use  Smoking Status Former Smoker  . Types: Cigarettes  . Quit date: 11/05/1965  . Years since quitting: 53.9  Smokeless Tobacco Never Used  Tobacco Comment   socially as a teen     Counseling given: Not Answered Comment: socially as a teen   Clinical Intake:  Pre-visit preparation completed: Yes  Pain : No/denies pain     Nutritional Risks: None Diabetes: No  How often do you need to have someone help you when you read instructions, pamphlets, or other written materials from your doctor or  pharmacy?: 1 - Never What is the last grade level you completed in school?: some college  Interpreter Needed?: No  Information entered by :: CJohnson, LPN  Past Medical History:  Diagnosis Date  . Allergy   . Anesthesia complication    trouble waking up 2/2 CPAP  . Arthritis   . Asthma in adult   . Cataract 2019   left eye resolved with surgery  . Cervical spondylosis 2012   Jacelyn Grip, [place])  . Choroidal nevus of left eye 03/22/2016  . Chronic venous insufficiency 2016   severe reflux with painful varicosities sees [facility]  . Clotting disorder ([place])    due to vein ablation   . Complication of anesthesia    difficulty coming out due to sleep apnea per pt  . Difficult intubation    PATIENT DENIES   . Diverticulosis    per ct scans 09-2016 and 01-2017  . DVT (deep venous thrombosis) ([name]) 2011   small, developed after venous ablation  . DVT (deep venous thrombosis) ([place])   . Family history of adverse reaction to anesthesia    O2 levels drop upon waking   . Hearing loss sensory, bilateral 2013   mod-severe high freq sensorineural ([facility])  . Hiatal hernia     09-2016 ct scan [facility] at cancer center   . History of kidney stones 1980s  . History of radiation therapy 07/19/11-08/25/2011   RIGHT AXILLARY REGION/METASTATIC  . Hypertension   . Melanoma ([place])  06/29/10   MALIGNANT MELANOMA R SHOULDER/SUPRASCAPULAR BACK s/p interferon chemo and XRT  . Neuromuscular disorder ([facility])    muscle spasms  . OSA (obstructive sleep apnea)    on CPAP  . Pneumonia    2001  . RLS (restless legs syndrome)   . Seasonal allergies   . Sleep apnea    wears cpap  . Tinnitus   . Unspecified vitamin D deficiency 03/18/2013  . Varicose veins of left lower extremity    Past Surgical History:  Procedure Laterality Date  . ABI  2016   WNL  . ANTERIOR CERVICAL DECOMP/DISCECTOMY FUSION N/A 08/22/2015   ANTERIOR CERVICAL DECOMPRESSION FUSION C3/4 - interbody graft and anterior plate; Kristeen Miss,  MD  . ANTERIOR CERVICAL DECOMP/DISCECTOMY FUSION N/A 07/02/2016   Procedure: Cervical four- five Cervical five- six Cervical six- seven Anterior cervical decompression/diskectomy/fusion;  Surgeon: Kristeen Miss, MD;  Location: [facility];  Service: Neurosurgery;  Laterality: N/A;  C4-5 C5-6 C6-7 Anterior cervical decompression/diskectomy/fusion  . BREAST BIOPSY Left 2013   BENIGN  . BREAST SURGERY Right 1990   BX   . CARDIOVASCULAR STRESS TEST  2010   normal stress test, EF 66%  . CATARACT EXTRACTION W/ INTRAOCULAR LENS IMPLANT Left 2019   Dr. Kathrin Penner  . COLONOSCOPY  03/2005   benign polyp, int hem, rpt 5 yrs ([place], [place])  . COLONOSCOPY WITH PROPOFOL N/A 01/06/2018   diverticulosis, decreased sphincter tone, no f/u needed [name], Kirke Corin, MD)  . DEEP AXILLARY SENTINEL NODE BIOPSY / EXCISION  2012   RIGHT  . DEXA  06/2016   WNL  . DILATION AND CURETTAGE OF UTERUS  2010  . ENDOVENOUS ABLATION SAPHENOUS VEIN W/ LASER Left 2010   [facility]  . ENDOVENOUS ABLATION SAPHENOUS VEIN W/ LASER Left 2016   accessory branch [facility] Kellie Simmering)  . ESOPHAGOGASTRODUODENOSCOPY  2006   mod chronic carditis, no H pylori ([place], [facility])  . INTRAVASCULAR ULTRASOUND/IVUS N/A 08/03/2019   Waynetta Sandy, [place]   left  . LOWER EXTREMITY VENOGRAPHY Left 08/03/2019   Waynetta Sandy, MD  . LUMBAR LAMINECTOMY  2001   L4/5  . MELANOMA EXCISION  2011   Right shoulder, with sentinel lymph node biopsy  . PERIPHERAL VASCULAR INTERVENTION Left 08/03/2019   Stent of left common iliac vein with 14 x 60 mm Vici Donzetta Matters, [place] Dom, MD)  . PORT-A-CATH REMOVAL  12/05/2011   Procedure: REMOVAL PORT-A-CATH;  Surgeon: Rolm Bookbinder, MD;  Location: [facility];  Service: General;  Laterality: N/A;  left port removal  . PORTACATH PLACEMENT     left subclavian  . UPPER GASTROINTESTINAL ENDOSCOPY     Family History  Problem Relation Age of Onset  .  Cancer Mother 5       uterine  . CAD Father 49       MI  . Hypertension Father 16  . Alzheimer's disease Father 76  . Cancer Cousin        x2, breast  . Diabetes Sister   . Cancer Paternal Grandmother        melanoma, possibly  . Colon cancer Neg Hx   . Colon polyps Neg Hx   . Rectal cancer Neg Hx   . Stomach cancer Neg Hx    Social History   Socioeconomic History  . Marital status: Divorced    Spouse name: Not on file  . Number of children: 0  . Years of  education: Not on file  . Highest education level: Not on file  Occupational History  . Occupation: retired    Fish farm manager: RETIRED    Comment: Government social research officer   Social Needs  . Financial resource strain: Not hard at all  . Food insecurity    Worry: Never true    Inability: Never true  . Transportation needs    Medical: No    Non-medical: No  Tobacco Use  . Smoking status: Former Smoker    Types: Cigarettes    Quit date: 11/05/1965    Years since quitting: 53.9  . Smokeless tobacco: Never Used  . Tobacco comment: socially as a teen  Substance and Sexual Activity  . Alcohol use: No    Alcohol/week: 0.0 standard drinks  . Drug use: No  . Sexual activity: Never  Lifestyle  . Physical activity    Days per week: 0 days    Minutes per session: 0 min  . Stress: Not at all  Relationships  . Social Herbalist on phone: Not on file    Gets together: Not on file    Attends religious service: Not on file    Active member of club or organization: Not on file    Attends meetings of clubs or organizations: Not on file    Relationship status: Not on file  Other Topics Concern  . Not on file  Social History Narrative   Lives alone.  Sister and husband live next door   Lives in [place].   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   Edu: some college   Diet: poor - good water, rare fruits/vegetables    Outpatient Encounter Medications as of 10/08/2019  Medication Sig  . acetaminophen  (TYLENOL ARTHRITIS PAIN) 650 MG CR tablet Take 1,300 mg by mouth every 8 (eight) hours as needed for pain.   Marland Kitchen albuterol (PROVENTIL HFA;VENTOLIN HFA) 108 (90 Base) MCG/ACT inhaler INHALE 2 PUFFS INTO LUNGS EVERY 6 HOURS AS NEEDED FOR WHEEZING OR SHORTNESS OF BREATH  . albuterol (PROVENTIL) (2.5 MG/3ML) 0.083% nebulizer solution Take 3 mLs (2.5 mg total) by nebulization every 6 (six) hours as needed for wheezing or shortness of breath.  Marland Kitchen aspirin EC 81 MG tablet Take 81 mg by mouth at bedtime. Melanoma prevention  . azelastine (ASTELIN) 0.1 % nasal spray Place 1 spray into both nostrils 2 (two) times daily. Use in each nostril as directed  . clopidogrel (PLAVIX) 75 MG tablet Take 1 tablet (75 mg total) by mouth daily.  . famotidine (PEPCID) 10 MG tablet Take 10 mg by mouth daily.  . furosemide (LASIX) 40 MG tablet Take 1 tablet (40 mg total) by mouth daily as needed for edema (take extra lasix 40mg  in afternoons as needed).  . loratadine (CLARITIN) 10 MG tablet Take 10 mg by mouth daily.  Marland Kitchen losartan (COZAAR) 25 MG tablet TAKE 2 TABLETS BY MOUTH EVERY DAY  . meloxicam (MOBIC) 15 MG tablet Take 15 mg by mouth daily. As needed  . montelukast (SINGULAIR) 10 MG tablet TAKE 1 TABLET BY MOUTH EVERY DAY  . Multiple Vitamin (MULTIVITAMIN WITH MINERALS) TABS tablet Take 1 tablet by mouth daily.  . nortriptyline (PAMELOR) 25 MG capsule Take 1 capsule (25 mg total) by mouth 2 (two) times daily.  . Polyvinyl Alcohol-Povidone (TEARS PLUS OP) Place 1 drop into both eyes daily as needed (dry eyes/ redness/ burning).  . potassium chloride SA (K-DUR,KLOR-CON) 20 MEQ tablet Take 1 tablet (  20 mEq total) by mouth daily as needed (take one extra potassium with lasix).  Marland Kitchen PRESCRIPTION MEDICATION CPAP  . Probiotic Product (PROBIOTIC DAILY PO) Take 1 tablet by mouth daily.   Marland Kitchen triamcinolone (NASACORT ALLERGY 24HR) 55 MCG/ACT AERO nasal inhaler Place 2 sprays into the nose daily. In each nostril  . UNABLE TO FIND Take by  mouth.  . Vitamin D, Ergocalciferol, (DRISDOL) 1.25 MG (50000 UT) CAPS capsule TAKE 1 CAPSULE (50,000 UNITS TOTAL) BY MOUTH EVERY 7 (SEVEN) DAYS.   No facility-administered encounter medications on file as of 10/08/2019.     Activities of Daily Living In your present state of health, do you have any difficulty performing the following activities: 10/08/2019  Hearing? N  Vision? N  Difficulty concentrating or making decisions? N  Walking or climbing stairs? N  Dressing or bathing? N  Doing errands, shopping? N  Preparing Food and eating ? N  Using the Toilet? N  In the past six months, have you accidently leaked urine? N  Do you have problems with loss of bowel control? N  Managing your Medications? N  Managing your Finances? N  Housekeeping or managing your Housekeeping? N  Some recent data might be hidden    Patient Care Team: [name], MD as PCP - General (Family Medicine) Marin Olp, Rudell Cobb, MD as Consulting Physician (Oncology) Kristeen Miss, MD as Consulting Physician (Neurosurgery) Danella Sensing, MD as Consulting Physician (Dermatology) Rigoberto Noel, MD as Consulting Physician (Pulmonary Disease) Garrel Ridgel, DPM as Consulting Physician (Podiatry) Eulogio Bear, MD as Consulting Physician (Ophthalmology)    Assessment:   This is a routine wellness examination for [place].  Exercise Activities and Dietary recommendations Current Exercise Habits: The patient does not participate in regular exercise at present, Exercise limited by: None identified  Goals    . DIET - INCREASE WATER INTAKE     Starting 10/01/2018, I will continue to drink at least 5-8 glasses of water daily.     . Patient Stated     10/08/2019, I will maintain and continue medications as prescribed.        Fall Risk Fall Risk  10/08/2019 10/01/2018 09/23/2018 09/25/2017 01/09/2017  Falls in the past year? 1 0 0 No No  Comment has sores on her leg and has wrappings and a special shoe that  tripped her over - Emmi Telephone Survey: data to providers prior to load - -  Number falls in past yr: 1 - - - -  Injury with Fall? 0 - - - -  Risk for fall due to : Impaired mobility;Impaired balance/gait;Medication side effect - - - -  Follow up Falls evaluation completed;Falls prevention discussed - - - -   Is the patient's home free of loose throw rugs in walkways, pet beds, electrical cords, etc?   yes      Grab bars in the bathroom? no      Handrails on the stairs?   yes      Adequate lighting?   yes  Timed Get Up and Go performed: N/A  Depression Screen PHQ 2/9 Scores 10/08/2019 10/01/2018 09/25/2017 09/24/2016  PHQ - 2 Score 0 0 0 0  PHQ- 9 Score 0 0 0 -     Cognitive Function MMSE - Mini Mental State Exam 10/08/2019 10/01/2018 09/25/2017 09/24/2016  Orientation to time 5 5 5 5   Orientation to Place 5 5 5 5   Registration 3 3 3 3   Attention/ Calculation 5 0 0 0  Recall 3 3 3 3   Language- name 2 objects - 0 0 0  Language- repeat 1 1 1 1   Language- follow 3 step command - 3 3 3   Language- read & follow direction - 0 0 0  Write a sentence - 0 0 0  Copy design - 0 0 0  Total score - 20 20 20   Mini Cog  Mini-Cog screen was completed. Maximum score is 22. A value of 0 denotes this part of the MMSE was not completed or the patient failed this part of the Mini-Cog screening.       Immunization History  Administered Date(s) Administered  . Influenza, High Dose Seasonal PF 08/13/2016, 07/31/2017, 07/28/2018  . Influenza, Seasonal, Injecte, Preservative Fre 09/01/2009  . Influenza,inj,Quad PF,6+ Mos 08/06/2013, 09/17/2014, 08/18/2015  . Pneumococcal Conjugate-13 09/22/2014  . Pneumococcal Polysaccharide-23 05/15/2013    Qualifies for Shingles Vaccine? Yes  Screening Tests Health Maintenance  Topic Date Due  . INFLUENZA VACCINE  06/06/2019  . DTaP/Tdap/Td (1 - Tdap) 10/07/2020 (Originally 07/09/1965)  . MAMMOGRAM  10/07/2020 (Originally 08/13/2019)  . TETANUS/TDAP   10/07/2020 (Originally 11/05/2018)  . COLONOSCOPY  01/07/2028  . DEXA SCAN  Completed  . Hepatitis C Screening  Completed  . PNA vac Low Risk Adult  Completed    Cancer Screenings: Lung: Low Dose CT Chest recommended if Age 31-80 years, 30 pack-year currently smoking OR have quit w/in 15years. Patient does not qualify. Breast:  Up to date on Mammogram? No, declined- will wait until pandemic is under control   Up to date of Bone Density/Dexa? Yes, completed 06/18/2016 Colorectal: completed 01/06/2018  Additional Screenings:  Hepatitis C Screening: 09/19/2015     Plan:    Patient will maintain and continue medications as prescribed.    I have personally reviewed and noted the following in the patient's chart:   . Medical and social history . Use of alcohol, tobacco or illicit drugs  . Current medications and supplements . Functional ability and status . Nutritional status . Physical activity . Advanced directives . List of other physicians . Hospitalizations, surgeries, and ER visits in previous 12 months . Vitals . Screenings to include cognitive, depression, and falls . Referrals and appointments  In addition, I have reviewed and discussed with patient certain preventive protocols, quality metrics, and best practice recommendations. A written personalized care plan for preventive services as well as general preventive health recommendations were provided to patient.     Andrez Grime, LPN  [id number]

## 2019-10-09 ENCOUNTER — Other Ambulatory Visit: Payer: Self-pay

## 2019-10-09 ENCOUNTER — Ambulatory Visit (INDEPENDENT_AMBULATORY_CARE_PROVIDER_SITE_OTHER): Payer: Medicare Other | Admitting: Physician Assistant

## 2019-10-09 VITALS — BP 129/77 | HR 83 | Temp 98.1°F | Resp 20 | Ht 63.0 in | Wt 219.0 lb

## 2019-10-09 DIAGNOSIS — I83892 Varicose veins of left lower extremities with other complications: Secondary | ICD-10-CM

## 2019-10-09 DIAGNOSIS — R609 Edema, unspecified: Secondary | ICD-10-CM | POA: Diagnosis not present

## 2019-10-09 DIAGNOSIS — I872 Venous insufficiency (chronic) (peripheral): Secondary | ICD-10-CM | POA: Diagnosis not present

## 2019-10-09 DIAGNOSIS — L97929 Non-pressure chronic ulcer of unspecified part of left lower leg with unspecified severity: Secondary | ICD-10-CM | POA: Diagnosis not present

## 2019-10-09 DIAGNOSIS — I83029 Varicose veins of left lower extremity with ulcer of unspecified site: Secondary | ICD-10-CM

## 2019-10-09 DIAGNOSIS — L97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d: Secondary | ICD-10-CM | POA: Diagnosis not present

## 2019-10-09 NOTE — Progress Notes (Signed)
Established Venous Insufficiency   History of Present Illness   [name] is a 73 y.o. ([birth year]) female who presents for wound recheck.  She has had a left greater saphenous vein ablation by Dr. Kellie Simmering in 2016.  Most recently she underwent left common iliac vein stent by Dr. 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  Since that time she feels that the swelling in her leg has drastically improved and her venous ulcerations have also improved.  She continues to see the wound clinic every Wednesday.  She is making an attempt to elevate her legs as much as possible during the day.  She continues to have some form of compression daily.  She is taking aspirin and Plavix daily.   The patient's PMH, PSH, SH, and FamHx were reviewed and are unchanged from prior visit.  Current Outpatient Medications  Medication Sig Dispense Refill  . acetaminophen (TYLENOL ARTHRITIS PAIN) 650 MG CR tablet Take 1,300 mg by mouth every 8 (eight) hours as needed for pain.     Marland Kitchen albuterol (PROVENTIL HFA;VENTOLIN HFA) 108 (90 Base) MCG/ACT inhaler INHALE 2 PUFFS INTO LUNGS EVERY 6 HOURS AS NEEDED FOR WHEEZING OR SHORTNESS OF BREATH 8.5 Inhaler 3  . albuterol (PROVENTIL) (2.5 MG/3ML) 0.083% nebulizer solution Take 3 mLs (2.5 mg total) by nebulization every 6 (six) hours as needed for wheezing or shortness of breath. 150 mL 1  . aspirin EC 81 MG tablet Take 81 mg by mouth at bedtime. Melanoma prevention    . azelastine (ASTELIN) 0.1 % nasal spray Place 1 spray into both nostrils 2 (two) times daily. Use in each nostril as directed 30 mL 3  . clopidogrel (PLAVIX) 75 MG tablet Take 1 tablet (75 mg total) by mouth daily. 30 tablet 3  . famotidine (PEPCID) 10 MG tablet Take 10 mg by mouth daily.    . furosemide (LASIX) 40 MG tablet Take 1 tablet (40 mg total) by mouth daily as needed for edema (take extra lasix 40mg  in afternoons as needed). 30 tablet 0  . loratadine (CLARITIN) 10 MG tablet Take 10 mg by mouth daily.     Marland Kitchen losartan (COZAAR) 25 MG tablet TAKE 2 TABLETS BY MOUTH EVERY DAY 60 tablet 5  . meloxicam (MOBIC) 15 MG tablet Take 15 mg by mouth daily. As needed  3  . montelukast (SINGULAIR) 10 MG tablet TAKE 1 TABLET BY MOUTH EVERY DAY 30 tablet 3  . Multiple Vitamin (MULTIVITAMIN WITH MINERALS) TABS tablet Take 1 tablet by mouth daily.    . nortriptyline (PAMELOR) 25 MG capsule Take 1 capsule (25 mg total) by mouth 2 (two) times daily. 60 capsule 3  . Polyvinyl Alcohol-Povidone (TEARS PLUS OP) Place 1 drop into both eyes daily as needed (dry eyes/ redness/ burning).    . potassium chloride SA (K-DUR,KLOR-CON) 20 MEQ tablet Take 1 tablet (20 mEq total) by mouth daily as needed (take one extra potassium with lasix). 30 tablet 0  . PRESCRIPTION MEDICATION CPAP    . Probiotic Product (PROBIOTIC DAILY PO) Take 1 tablet by mouth daily.     Marland Kitchen triamcinolone (NASACORT ALLERGY 24HR) 55 MCG/ACT AERO nasal inhaler Place 2 sprays into the nose daily. In each nostril    . UNABLE TO FIND Take by mouth.    . Vitamin D, Ergocalciferol, (DRISDOL) 1.25 MG (50000 UT) CAPS capsule TAKE 1 CAPSULE (50,000 UNITS TOTAL) BY MOUTH EVERY 7 (SEVEN) DAYS. 12 capsule 3   No current facility-administered medications for  this visit.     Allergies  Allergen Reactions  . Sulfa Antibiotics Itching, Swelling and Shortness Of Breath    Facial swelling  . Voltaren [Diclofenac Sodium] Shortness Of Breath  . Latex Other (See Comments)    Blisters ONLY HAD REACTION TO TAPE  (??? ONLY ADHESIVE ALLERGY)  . Tape Other (See Comments)    Caused blisters - must use paper tape - same reaction from NeoPrene  . Other     Neoprene    On ROS today: 10 system ROS is negative unless otherwise noted in HPI   Physical Examination   Vitals:   10/09/19 1150  BP: 129/77  Pulse: 83  Resp: 20  Temp: 98.1 F (36.7 C)  TempSrc: Oral  SpO2: 99%  Weight: 219 lb (99.3 kg)  Height: 5\' 3"  (1.6 m)   Body mass index is 38.79 kg/m.  General  Alert, O x 3, WD, NAD  Pulmonary Sym exp, good B air movt, CTA B  Cardiac RRR, Nl S1, S2  Vascular Vessel Right Left  Radial Palpable Palpable  Femoral Palpable Palpable  Popliteal Not palpable Not palpable  PT Not palpable Not palpable ATA  DP Not palpable Faintly palpable    Musculo- skeletal M/S 5/5 throughout  , Extremities without ischemic changes posterior medial and posterior lateral ulceration L ankle with healthy wound bed, no surrounding erythema  Neurologic Cranial nerves 2-12 intact , Pain and light touch intact in extremities , Motor exam as listed above     Medical Decision Making   [name] is a 73 y.o. 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  She has an appointment today at the wound clinic so we will not place Unna boot today instead we will apply nonadherent dressing to her ulcerations followed by Ace wrap for compression  Continue aspirin and Plavix daily  Recheck IVC/iliac venous duplex 6 months postoperatively   Dagoberto Ligas PA-C [facility] Office: [facility] MD: Donzetta Matters

## 2019-10-09 NOTE — Progress Notes (Signed)
[name], [name] Visit Report for 10/09/2019 Arrival Information Details Patient Name: [name], [name]. Date of Service: 10/09/2019 2:30 PM Medical Record Number: [id number] Patient Account Number: [id number] Date of Birth/Sex: 09-07-1946 (73 y.o. F) Treating RN: Harold Barban Primary Care Miesha Bachmann: [name] Other Clinician: Referring Daphine Loch: [name] Treating Taron [name]/Extender: Melburn Hake, HOYT Weeks in Treatment: 50 Visit Information History Since Last Visit Added or deleted any medications: No Patient Arrived: Ambulatory Any new allergies or adverse reactions: No Arrival Time: 14:34 Had a fall or experienced change in No Accompanied By: self activities of daily living that may affect Transfer Assistance: None risk of falls: Patient Identification Verified: Yes Signs or symptoms of abuse/neglect since last visito No Secondary Verification Process Yes Hospitalized since last visit: No Completed: Has Dressing in Place as Prescribed: Yes Patient Requires Transmission-Based No Has Compression in Place as Prescribed: No Precautions: Pain Present Now: No Patient Has Alerts: Yes Patient Alerts: Patient on Blood Thinner aspirin 81 Electronic Signature(s) Signed: 10/09/2019 4:16:49 PM By: Harold Barban Entered By: Harold Barban on 10/09/2019 14:50:37 [name] -------------------------------------------------------------------------------- Compression Therapy Details Patient Name: [name], [name]. Date of Service: 10/09/2019 2:30 PM Medical Record Number: [id number] Patient Account Number: [id number] Date of Birth/Sex: [birth year] (73 y.o. F) Treating RN: Harold Barban Primary Care Jaime Grizzell: [name] Other Clinician: Referring Reilly Blades: [name] Treating Rosalinda Seaman/Extender: STONE III, HOYT Weeks in Treatment: 43 Compression Therapy Performed for Wound Assessment: Wound #5 Left,Medial Lower Leg Performed By:  Clinician Harold Barban, RN Compression Type: Three Layer Electronic Signature(s) Signed: 10/09/2019 4:16:49 PM By: Harold Barban Entered By: Harold Barban on 10/09/2019 14:52:35 [name] -------------------------------------------------------------------------------- Encounter Discharge Information Details Patient Name: [name], [name]. Date of Service: 10/09/2019 2:30 PM Medical Record Number: [id number] Patient Account Number: [id number] Date of Birth/Sex: 1946-03-22 (73 y.o. F) Treating RN: Harold Barban Primary Care Harlowe Dowler: [name] Other Clinician: Referring Myquan Schaumburg: [name] Treating Gemayel Mascio/Extender: Melburn Hake, HOYT Weeks in Treatment: 43 Encounter Discharge Information Items Discharge Condition: Stable Ambulatory Status: Ambulatory Discharge Destination: Home Transportation: Private Auto Accompanied By: self Schedule Follow-up Appointment: Yes Clinical Summary of Care: Electronic Signature(s) Signed: 10/09/2019 4:16:49 PM By: Harold Barban Entered By: Harold Barban on 10/09/2019 14:53:42 [name] -------------------------------------------------------------------------------- Wound Assessment Details Patient Name: [name], [name]. Date of Service: 10/09/2019 2:30 PM Medical Record Number: [id number] Patient Account Number: [id number] Date of Birth/Sex: 1946-03-06 (73 y.o. F) Treating RN: Harold Barban Primary Care Sherrian Nunnelley: [name] Other Clinician: Referring Rodarius Kichline: [name] Treating Khamila Bassinger/Extender: STONE III, HOYT Weeks in Treatment: 43 Wound Status Wound Number: 5 Primary Lymphedema Etiology: Wound Location: Left Lower Leg - Medial Wound Open Wounding Event: Gradually Appeared Status: Date Acquired: 11/19/2018 Comorbid Cataracts, Asthma, Sleep Apnea, Deep Vein Weeks Of Treatment: 43 History: Thrombosis, Hypertension, Peripheral Venous Clustered Wound: No Disease,  Osteoarthritis, Received Chemotherapy, Received Radiation Wound Measurements Length: (cm) 2.8 Width: (cm) 3.5 Depth: (cm) 0.2 Area: (cm) 7.697 Volume: (cm) 1.539 % Reduction in Area: 9.9% % Reduction in Volume: -80% Epithelialization: None Tunneling: No Undermining: No Wound Description Full Thickness Without Exposed Support Classification: Structures Wound Margin: Flat and Intact Exudate Large Amount: Exudate Type: Serosanguineous Exudate Color: red, brown Foul Odor After Cleansing: No Slough/Fibrino Yes Wound Bed Granulation Amount: Medium (34-66%) Exposed Structure Granulation Quality: Pale Fascia Exposed: No Necrotic Amount: Medium (34-66%) Fat Layer (Subcutaneous Tissue) Exposed: Yes Necrotic Quality: Adherent Slough Tendon Exposed: No Muscle Exposed: No Joint Exposed: No Bone Exposed: No Treatment Notes Wound #5 (Left,  Medial Lower Leg) Notes TCA, Adaptic, Silver alginate, drawtex, 3 layer, unna to anchor Electronic Signature(s) Signed: 10/09/2019 4:16:49 PM By: Harold Barban Entered By: Harold Barban on 10/09/2019 14:51:39 [name] [name] -------------------------------------------------------------------------------- Wound Assessment Details Patient Name: [name], [name]. Date of Service: 10/09/2019 2:30 PM Medical Record Number: [id number] Patient Account Number: [id number] Date of Birth/Sex: 1945/11/26 (73 y.o. F) Treating RN: Harold Barban Primary Care Akaysha Cobern: [name] Other Clinician: Referring Osborn Pullin: [name] Treating Pahoua Schreiner/Extender: STONE III, HOYT Weeks in Treatment: 43 Wound Status Wound Number: 6 Primary Venous Leg Ulcer Etiology: Wound Location: Left Lower Leg - Lateral Wound Open Wounding Event: Gradually Appeared Status: Date Acquired: 01/19/2019 Comorbid Cataracts, Asthma, Sleep Apnea, Deep Vein Weeks Of Treatment: 37 History: Thrombosis, Hypertension,  Peripheral Venous Clustered Wound: No Disease, Osteoarthritis, Received Chemotherapy, Received Radiation Wound Measurements Length: (cm) 4 Width: (cm) 3 Depth: (cm) 0.2 Area: (cm) 9.425 Volume: (cm) 1.885 % Reduction in Area: -4184.1% % Reduction in Volume: -8468.2% Epithelialization: None Tunneling: No Undermining: No Wound Description Full Thickness Without Exposed Support Classification: Structures Wound Margin: Flat and Intact Exudate Large Amount: Exudate Type: Serosanguineous Exudate Color: red, brown Foul Odor After Cleansing: No Slough/Fibrino Yes Wound Bed Granulation Amount: Medium (34-66%) Exposed Structure Granulation Quality: Pink Fascia Exposed: No Necrotic Amount: Medium (34-66%) Fat Layer (Subcutaneous Tissue) Exposed: Yes Necrotic Quality: Adherent Slough Tendon Exposed: No Muscle Exposed: No Joint Exposed: No Bone Exposed: No Treatment Notes Wound #6 (Left, Lateral Lower Leg) Notes TCA, Adaptic, Silver alginate, drawtex, 3 layer, unna to anchor Electronic Signature(s) Signed: 10/09/2019 4:16:49 PM By: Harold Barban Entered By: Harold Barban on 10/09/2019 14:51:55 L[place], Tenna [name] ([id number]) [place], Tenna [name] ([id number]) -------------------------------------------------------------------------------- Wound Assessment Details Patient Name: [name], [name]. Date of Service: 10/09/2019 2:30 PM Medical Record Number: [id number] Patient Account Number: [id number] Date of Birth/Sex: 1946/05/30 (73 y.o. F) Treating RN: Harold Barban Primary Care Auther Lyerly: [name] Other Clinician: Referring Alianna Wurster: [name] Treating Sven Pinheiro/Extender: STONE III, HOYT Weeks in Treatment: 43 Wound Status Wound Number: 9 Primary Venous Leg Ulcer Etiology: Wound Location: Left Lower Leg - Lateral, Posterior Wound Open Wounding Event: Gradually Appeared Status: Date Acquired: 07/08/2019 Comorbid Cataracts, Asthma, Sleep Apnea, Deep  Vein Weeks Of Treatment: 13 History: Thrombosis, Hypertension, Peripheral Venous Clustered Wound: No Disease, Osteoarthritis, Received Chemotherapy, Received Radiation Wound Measurements Length: (cm) 0.8 Width: (cm) 1 Depth: (cm) 0.1 Area: (cm) 0.628 Volume: (cm) 0.063 % Reduction in Area: -11.2% % Reduction in Volume: -10.5% Epithelialization: None Tunneling: No Undermining: No Wound Description Full Thickness Without Exposed Support Classification: Structures Wound Margin: Flat and Intact Exudate Large Amount: Exudate Type: Serosanguineous Exudate Color: red, brown Foul Odor After Cleansing: No Slough/Fibrino Yes Wound Bed Granulation Amount: Medium (34-66%) Exposed Structure Granulation Quality: Pink Fascia Exposed: No Necrotic Amount: Medium (34-66%) Fat Layer (Subcutaneous Tissue) Exposed: Yes Necrotic Quality: Adherent Slough Tendon Exposed: No Muscle Exposed: No Joint Exposed: No Bone Exposed: No Treatment Notes Wound #9 (Left, Lateral, Posterior Lower Leg) Notes TCA, Adaptic, Silver alginate, drawtex, 3 layer, unna to anchor Electronic Signature(s) Signed: 10/09/2019 4:16:49 PM By: Harold Barban Entered By: Harold Barban on 10/09/2019 14:52:14

## 2019-10-14 ENCOUNTER — Other Ambulatory Visit: Payer: Self-pay

## 2019-10-14 ENCOUNTER — Encounter: Payer: Medicare Other | Admitting: Internal Medicine

## 2019-10-14 ENCOUNTER — Ambulatory Visit (INDEPENDENT_AMBULATORY_CARE_PROVIDER_SITE_OTHER): Payer: Medicare Other | Admitting: Family Medicine

## 2019-10-14 ENCOUNTER — Encounter: Payer: Self-pay | Admitting: Family Medicine

## 2019-10-14 VITALS — BP 126/78 | HR 71 | Temp 97.9°F | Ht 62.0 in | Wt 219.3 lb

## 2019-10-14 DIAGNOSIS — I872 Venous insufficiency (chronic) (peripheral): Secondary | ICD-10-CM | POA: Diagnosis not present

## 2019-10-14 DIAGNOSIS — I871 Compression of vein: Secondary | ICD-10-CM | POA: Insufficient documentation

## 2019-10-14 DIAGNOSIS — I890 Lymphedema, not elsewhere classified: Secondary | ICD-10-CM | POA: Diagnosis not present

## 2019-10-14 DIAGNOSIS — I83893 Varicose veins of bilateral lower extremities with other complications: Secondary | ICD-10-CM | POA: Diagnosis not present

## 2019-10-14 DIAGNOSIS — R600 Localized edema: Secondary | ICD-10-CM

## 2019-10-14 DIAGNOSIS — E559 Vitamin D deficiency, unspecified: Secondary | ICD-10-CM | POA: Diagnosis not present

## 2019-10-14 DIAGNOSIS — L97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d: Secondary | ICD-10-CM | POA: Diagnosis not present

## 2019-10-14 DIAGNOSIS — C4361 Malignant melanoma of right upper limb, including shoulder: Secondary | ICD-10-CM

## 2019-10-14 DIAGNOSIS — N281 Cyst of kidney, acquired: Secondary | ICD-10-CM

## 2019-10-14 DIAGNOSIS — S61211A Laceration without foreign body of left index finger without damage to nail, initial encounter: Secondary | ICD-10-CM | POA: Insufficient documentation

## 2019-10-14 DIAGNOSIS — M1189 Other specified crystal arthropathies, multiple sites: Secondary | ICD-10-CM

## 2019-10-14 DIAGNOSIS — L97929 Non-pressure chronic ulcer of unspecified part of left lower leg with unspecified severity: Secondary | ICD-10-CM

## 2019-10-14 DIAGNOSIS — Z23 Encounter for immunization: Secondary | ICD-10-CM

## 2019-10-14 DIAGNOSIS — L97222 Non-pressure chronic ulcer of left calf with fat layer exposed: Secondary | ICD-10-CM | POA: Diagnosis not present

## 2019-10-14 DIAGNOSIS — I83029 Varicose veins of left lower extremity with ulcer of unspecified site: Secondary | ICD-10-CM | POA: Diagnosis not present

## 2019-10-14 DIAGNOSIS — I83892 Varicose veins of left lower extremities with other complications: Secondary | ICD-10-CM

## 2019-10-14 DIAGNOSIS — I10 Essential (primary) hypertension: Secondary | ICD-10-CM

## 2019-10-14 DIAGNOSIS — G629 Polyneuropathy, unspecified: Secondary | ICD-10-CM | POA: Insufficient documentation

## 2019-10-14 DIAGNOSIS — R609 Edema, unspecified: Secondary | ICD-10-CM

## 2019-10-14 MED ORDER — POTASSIUM CHLORIDE CRYS ER 20 MEQ PO TBCR: 20.0000 meq | EXTENDED_RELEASE_TABLET | Freq: Every day | ORAL | 3 refills | Status: DC

## 2019-10-14 MED ORDER — NORTRIPTYLINE HCL 25 MG PO CAPS: 25.0000 mg | ORAL_CAPSULE | Freq: Every day | ORAL | 3 refills | Status: DC

## 2019-10-14 NOTE — Assessment & Plan Note (Addendum)
Continues seeing derm and ophtho regularly.

## 2019-10-14 NOTE — Assessment & Plan Note (Signed)
Stable levels. Continue weekly supplementation.

## 2019-10-14 NOTE — Assessment & Plan Note (Signed)
Chronic, stable on lasix and losartan.

## 2019-10-14 NOTE — Assessment & Plan Note (Addendum)
Diagnosed by ortho, started on nortriptyline 25mg  bid with benefit. She also thinks L common iliac vein stent has been helpful - will try to decrease dose to 25mg  once daily.

## 2019-10-14 NOTE — Assessment & Plan Note (Signed)
Sees rheum for h/o pseudogout.

## 2019-10-14 NOTE — Assessment & Plan Note (Addendum)
S/p L common iliac vein stenting 07/2019.

## 2019-10-14 NOTE — Progress Notes (Signed)
This visit was conducted in person.  BP 126/78 (BP Location: Left Arm, Patient Position: Sitting, Cuff Size: Large)   Pulse 71   Temp 97.9 F (36.6 C) (Temporal)   Ht 5\' 2"  (1.575 m)   Wt 219 lb 5 oz (99.5 kg)   SpO2 99%   BMI 40.11 kg/m    CC: AMW f/u visit Subjective:    Patient ID: [name], female    DOB: Aug 10, 1946, 73 y.o.   [name]  HPI: [name] is a 73 y.o. female presenting on 10/14/2019 for Annual Exam (Prt 2.  Wants to discuss potassium. )   Saw health advisor last week for medicare wellness visit. Note reviewed.   No exam data present    Clinical Support from 10/08/2019 in [place] at [facility] Total Score  0      Fall Risk  10/08/2019 10/01/2018 09/23/2018 09/25/2017 01/09/2017  Falls in the past year? 1 0 0 No No  Comment has sores on her leg and has wrappings and a special shoe that tripped her over - Emmi Telephone Survey: data to providers prior to load - -  Number falls in past yr: 1 - - - -  Injury with Fall? 0 - - - -  Risk for fall due to : Impaired mobility;Impaired balance/gait;Medication side effect - - - -  Follow up Falls evaluation completed;Falls prevention discussed - - - -  4 falls this past year, all mechanical (tripping over flip flops). No premonitory symptoms.   Pseudogout sees rheum ([facility] Isaias Sakai at [facility]). Sees yearly. Takes meloxicam 15mg  daily PRN. Only taking tylenol as she's on plavix.  Ortho - Dr Lynann Bologna retired. She did start nortroiptyline 25mg  bid. Prior bad experience with gabapentin.   Has been seeing vascular surgery and wound clinic regularly for poorly healing LLE venous ulcer wound. S/p L great saphenous vein ablation 2016 Kellie Simmering) then L common iliac vein stent 07/2019 for iliac vein compression syndrome (left) Donzetta Matters) with improvement in leg swelling and venous ulcerations. Sees wound clinic on Wednesdays.   Preventative: COLONOSCOPY WITH PROPOFOL 01/06/2018 - diverticulosis, decreased sphincter  tone, no f/u needed [name], Kirke Corin, MD) Well woman exam with Abel Presto. Mammogram yearly. Per patient receives yearly and [facility] at [facility]. Recently postponed due to pandemic - planning to go 11/2019.  DEXA Date:8/2017WNL.  Flu shot - yearly Tetanus~2010 Pneumovax 05/2013 , prevnar 09/2014 shingrix - discussed, declines. H/o shingles. Advanced directives: doesn't want prolonged life support. Unsure who would be HCPOA - likely one of her sisters. Packet previously provided. Seat belt use discussed. Sunscreen use discussed. No changing moles on skin. Sees dermand oncyearly (h/o melanoma). Ex smoker.  Alcohol - none   Dentist q6 mo  Eye exam yearly, watching R cataract  Bowel - no constipation  Bladder - no incontinence   Lives alone. Sister and husband live next door  Lives in [place].  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  Edu: some college Activity: no regular exercise Diet: poor - good water, rare fruits/vegetables     Relevant past medical, surgical, family and social history reviewed and updated as indicated. Interim medical history since our last visit reviewed. Allergies and medications reviewed and updated. Outpatient Medications Prior to Visit  Medication Sig Dispense Refill  . acetaminophen (TYLENOL ARTHRITIS PAIN) 650 MG CR tablet Take 1,300 mg by mouth every 8 (eight) hours as needed for pain.     Marland Kitchen  albuterol (PROVENTIL HFA;VENTOLIN HFA) 108 (90 Base) MCG/ACT inhaler INHALE 2 PUFFS INTO LUNGS EVERY 6 HOURS AS NEEDED FOR WHEEZING OR SHORTNESS OF BREATH 8.5 Inhaler 3  . albuterol (PROVENTIL) (2.5 MG/3ML) 0.083% nebulizer solution Take 3 mLs (2.5 mg total) by nebulization every 6 (six) hours as needed for wheezing or shortness of breath. 150 mL 1  . aspirin EC 81 MG tablet Take 81 mg by mouth at bedtime. Melanoma prevention    . azelastine (ASTELIN) 0.1 % nasal spray Place 1 spray into both nostrils 2 (two) times daily.  Use in each nostril as directed 30 mL 3  . clopidogrel (PLAVIX) 75 MG tablet Take 1 tablet (75 mg total) by mouth daily. 30 tablet 3  . famotidine (PEPCID) 10 MG tablet Take 10 mg by mouth daily.    . furosemide (LASIX) 40 MG tablet Take 1 tablet (40 mg total) by mouth daily.    Marland Kitchen loratadine (CLARITIN) 10 MG tablet Take 10 mg by mouth daily.    Marland Kitchen losartan (COZAAR) 25 MG tablet TAKE 2 TABLETS BY MOUTH EVERY DAY 60 tablet 5  . montelukast (SINGULAIR) 10 MG tablet TAKE 1 TABLET BY MOUTH EVERY DAY 30 tablet 3  . Multiple Vitamin (MULTIVITAMIN WITH MINERALS) TABS tablet Take 1 tablet by mouth daily.    . Polyvinyl Alcohol-Povidone (TEARS PLUS OP) Place 1 drop into both eyes daily as needed (dry eyes/ redness/ burning).    Marland Kitchen PRESCRIPTION MEDICATION CPAP    . Probiotic Product (PROBIOTIC DAILY PO) Take 1 tablet by mouth daily.     Marland Kitchen triamcinolone (NASACORT ALLERGY 24HR) 55 MCG/ACT AERO nasal inhaler Place 2 sprays into the nose daily. In each nostril    . UNABLE TO FIND Take by mouth.    . Vitamin D, Ergocalciferol, (DRISDOL) 1.25 MG (50000 UT) CAPS capsule TAKE 1 CAPSULE (50,000 UNITS TOTAL) BY MOUTH EVERY 7 (SEVEN) DAYS. 12 capsule 3  . furosemide (LASIX) 40 MG tablet Take 1 tablet (40 mg total) by mouth daily as needed for edema (take extra lasix 40mg  in afternoons as needed). 30 tablet 0  . nortriptyline (PAMELOR) 25 MG capsule Take 1 capsule (25 mg total) by mouth 2 (two) times daily. 60 capsule 3  . potassium chloride SA (K-DUR,KLOR-CON) 20 MEQ tablet Take 1 tablet (20 mEq total) by mouth daily as needed (take one extra potassium with lasix). (Patient taking differently: Take 20 mEq by mouth 2 (two) times daily. ) 30 tablet 0  . meloxicam (MOBIC) 15 MG tablet Take 15 mg by mouth daily. As needed  3   No facility-administered medications prior to visit.      Per HPI unless specifically indicated in ROS section below Review of Systems Objective:    BP 126/78 (BP Location: Left Arm, Patient  Position: Sitting, Cuff Size: Large)   Pulse 71   Temp 97.9 F (36.6 C) (Temporal)   Ht 5\' 2"  (1.575 m)   Wt 219 lb 5 oz (99.5 kg)   SpO2 99%   BMI 40.11 kg/m   Wt Readings from Last 3 Encounters:  10/14/19 219 lb 5 oz (99.5 kg)  10/09/19 219 lb (99.3 kg)  09/11/19 218 lb 6.4 oz (99.1 kg)    Physical Exam Vitals signs and nursing note reviewed.  Constitutional:      Appearance: Normal appearance. She is obese. She is not ill-appearing.  Eyes:     Extraocular Movements: Extraocular movements intact.     Conjunctiva/sclera: Conjunctivae normal.  Pupils: Pupils are equal, round, and reactive to light.  Cardiovascular:     Rate and Rhythm: Normal rate and regular rhythm.     Pulses: Normal pulses.     Heart sounds: Murmur (2/6 systolic) present.  Pulmonary:     Effort: Pulmonary effort is normal. No respiratory distress.     Breath sounds: Normal breath sounds. No wheezing, rhonchi or rales.  Musculoskeletal:        General: Swelling (chronic lower extremity swelling, bandages present bilateral legs) present.     Comments: 2+ DP bilaterally  Skin:    General: Skin is warm and dry.     Findings: Laceration present. No rash.     Comments: Laceration to L lateral index finger 2.5cm without significant surrounding erythema  Neurological:     General: No focal deficit present.     Mental Status: She is alert.  Psychiatric:        Mood and Affect: Mood normal.        Behavior: Behavior normal.       Results for orders placed or performed in visit on 10/07/19  TSH  Result Value Ref Range   TSH 3.29 0.35 - 4.50 uIU/mL  vit d  Result Value Ref Range   VITD 40.79 30.00 - 100.00 ng/mL  Basic metabolic panel  Result Value Ref Range   Sodium 142 135 - 145 mEq/L   Potassium 4.2 3.5 - 5.1 mEq/L   Chloride 104 96 - 112 mEq/L   CO2 30 19 - 32 mEq/L   Glucose, Bld 104 (H) 70 - 99 mg/dL   BUN 13 6 - 23 mg/dL   Creatinine, Ser 0.71 0.40 - 1.20 mg/dL   GFR 80.64 >60.00 mL/min    Calcium 9.7 8.4 - 10.5 mg/dL   Lab Results  Component Value Date   CHOL 141 09/25/2017   HDL 48.30 09/25/2017   LDLCALC 76 09/25/2017   LDLDIRECT 87 10/21/2012   TRIG 84.0 09/25/2017   CHOLHDL 3 09/25/2017    Assessment & Plan:  This visit occurred during the SARS-CoV-2 public health emergency.  Safety protocols were in place, including screening questions prior to the visit, additional usage of staff PPE, and extensive cleaning of exam room while observing appropriate contact time as indicated for disinfecting solutions.   Problem List Items Addressed This Visit    Vitamin D deficiency    Stable levels. Continue weekly supplementation.       Venous stasis ulcer of left lower leg with edema of left lower leg (HCC)    Appreciate wound care - continue close f/u.       Varicose veins of both lower extremities with complications   Relevant Medications   furosemide (LASIX) 40 MG tablet   Peripheral neuropathy    Diagnosed by ortho, started on nortriptyline 25mg  bid with benefit. She also thinks L common iliac vein stent has been helpful - will try to decrease dose to 25mg  once daily.       Relevant Medications   nortriptyline (PAMELOR) 25 MG capsule   Melanoma ([place])    Continues seeing derm and ophtho regularly.       May-Thurner syndrome    S/p L common iliac vein stenting 07/2019.       Relevant Medications   furosemide (LASIX) 40 MG tablet   Laceration of left index finger    Superficial but long, cat scratch. No signs of infection.  Update Td today.       Relevant  Orders   Td vaccine greater than or equal to 7yo preservative free IM (Completed)   Kidney cysts   Hypertension    Chronic, stable on lasix and losartan.       Relevant Medications   furosemide (LASIX) 40 MG tablet   Chronic venous insufficiency   Relevant Medications   furosemide (LASIX) 40 MG tablet   Calcium pyrophosphate arthropathy of multiple sites    Sees rheum for h/o pseudogout.        Bilateral lower extremity edema - Primary    Appreciate VVS and wound clinic care.  Significant improvement since L common iliac vein stent placed 07/2019.        Other Visit Diagnoses    Need for influenza vaccination       Relevant Orders   Flu Vaccine QUAD High Dose(Fluad) (Completed)       Meds ordered this encounter  Medications  . nortriptyline (PAMELOR) 25 MG capsule    Sig: Take 1 capsule (25 mg total) by mouth at bedtime.    Dispense:  30 capsule    Refill:  3  . potassium chloride SA (KLOR-CON) 20 MEQ tablet    Sig: Take 1 tablet (20 mEq total) by mouth daily.    Dispense:  90 tablet    Refill:  3   Orders Placed This Encounter  Procedures  . Flu Vaccine QUAD High Dose(Fluad)  . Td vaccine greater than or equal to 7yo preservative free IM    Patient instructions: Flu shot today Tetanus shot today (Td) Call to schedule mammogram. Ask to send [place] report.  Call to schedule well woman exam 20201.  Decrease nortriptyline to 25mg  nightly.  Good to see you today Return as needed or in 6 months for follow up visit.   Follow up plan: Return in about 6 months (around 04/13/2020) for follow up visit.  [name], MD

## 2019-10-14 NOTE — Assessment & Plan Note (Signed)
Appreciate wound care - continue close f/u.

## 2019-10-14 NOTE — Patient Instructions (Addendum)
Flu shot today Tetanus shot today (Td) Call to schedule mammogram. Ask to send [place] report.  Call to schedule well woman exam 20201.  Decrease nortriptyline to 25mg  nightly.  Good to see you today Return as needed or in 6 months for follow up visit.   Health Maintenance After Age 73 After age 50, you are at a higher risk for certain long-term diseases and infections as well as injuries from falls. Falls are a major cause of broken bones and head injuries in people who are older than age 72. Getting regular preventive care can help to keep you healthy and well. Preventive care includes getting regular testing and making lifestyle changes as recommended by your health care provider. Talk with your health care provider about:  Which screenings and tests you should have. A screening is a test that checks for a disease when you have no symptoms.  A diet and exercise plan that is right for you. What should I know about screenings and tests to prevent falls? Screening and testing are the best ways to find a health problem early. Early diagnosis and treatment give you the best chance of managing medical conditions that are common after age 77. Certain conditions and lifestyle choices may make you more likely to have a fall. Your health care provider may recommend:  Regular vision checks. Poor vision and conditions such as cataracts can make you more likely to have a fall. If you wear glasses, make sure to get your prescription updated if your vision changes.  Medicine review. Work with your health care provider to regularly review all of the medicines you are taking, including over-the-counter medicines. Ask your health care provider about any side effects that may make you more likely to have a fall. Tell your health care provider if any medicines that you take make you feel dizzy or sleepy.  Osteoporosis screening. Osteoporosis is a condition that causes the bones to get weaker. This can make the bones  weak and cause them to break more easily.  Blood pressure screening. Blood pressure changes and medicines to control blood pressure can make you feel dizzy.  Strength and balance checks. Your health care provider may recommend certain tests to check your strength and balance while standing, walking, or changing positions.  Foot health exam. Foot pain and numbness, as well as not wearing proper footwear, can make you more likely to have a fall.  Depression screening. You may be more likely to have a fall if you have a fear of falling, feel emotionally low, or feel unable to do activities that you used to do.  Alcohol use screening. Using too much alcohol can affect your balance and may make you more likely to have a fall. What actions can I take to lower my risk of falls? General instructions  Talk with your health care provider about your risks for falling. Tell your health care provider if: ? You fall. Be sure to tell your health care provider about all falls, even ones that seem minor. ? You feel dizzy, sleepy, or off-balance.  Take over-the-counter and prescription medicines only as told by your health care provider. These include any supplements.  Eat a healthy diet and maintain a healthy weight. A healthy diet includes low-fat dairy products, low-fat (lean) meats, and fiber from whole grains, beans, and lots of fruits and vegetables. Home safety  Remove any tripping hazards, such as rugs, cords, and clutter.  Install safety equipment such as grab bars in bathrooms  and safety rails on stairs.  Keep rooms and walkways well-lit. Activity   Follow a regular exercise program to stay fit. This will help you maintain your balance. Ask your health care provider what types of exercise are appropriate for you.  If you need a cane or walker, use it as recommended by your health care provider.  Wear supportive shoes that have nonskid soles. Lifestyle  Do not drink alcohol if your  health care provider tells you not to drink.  If you drink alcohol, limit how much you have: ? 0-1 drink a day for women. ? 0-2 drinks a day for men.  Be aware of how much alcohol is in your drink. In the [place] one drink equals one typical bottle of beer (12 oz), one-half glass of wine (5 oz), or one shot of hard liquor (1 oz).  Do not use any products that contain nicotine or tobacco, such as cigarettes and e-cigarettes. If you need help quitting, ask your health care provider. Summary  Having a healthy lifestyle and getting preventive care can help to protect your health and wellness after age 73.  Screening and testing are the best way to find a health problem early and help you avoid having a fall. Early diagnosis and treatment give you the best chance for managing medical conditions that are more common for people who are older than age 9.  Falls are a major cause of broken bones and head injuries in people who are older than age 71. Take precautions to prevent a fall at home.  Work with your health care provider to learn what changes you can make to improve your health and wellness and to prevent falls. This information is not intended to replace advice given to you by your health care provider. Make sure you discuss any questions you have with your health care provider. Document Released: 09/04/2017 Document Revised: 02/12/2019 Document Reviewed: 09/04/2017 Elsevier Patient Education  2020 [employer].

## 2019-10-14 NOTE — Assessment & Plan Note (Addendum)
Superficial but long, cat scratch. No signs of infection.  Update Td today.

## 2019-10-14 NOTE — Assessment & Plan Note (Signed)
Appreciate VVS and wound clinic care.  Significant improvement since L common iliac vein stent placed 07/2019.

## 2019-10-15 NOTE — Progress Notes (Signed)
Amber, [name] ([id number]) Visit Report for 10/14/2019 Arrival Information Details Patient Name: [name], [name]. Date of Service: 10/14/2019 2:00 PM Medical Record Number: [id number] Patient Account Number: [id number] Date of Birth/Sex: 1946-03-05 (73 y.o. F) Treating RN: Army Melia Primary Care [name]: [name] Other Clinician: Referring Tashina Credit: [name] Treating Armas Mcbee/Extender: Tito Dine in Treatment: 76 Visit Information History Since Last Visit Added or deleted any medications: No Patient Arrived: Ambulatory Any new allergies or adverse reactions: No Arrival Time: 14:25 Had a fall or experienced change in No Accompanied By: self activities of daily living that may affect Transfer Assistance: None risk of falls: Patient Identification Verified: Yes Signs or symptoms of abuse/neglect since last visito No Patient Requires Transmission-Based No Hospitalized since last visit: No Precautions: Has Dressing in Place as Prescribed: Yes Patient Has Alerts: Yes Pain Present Now: No Patient Alerts: Patient on Blood Thinner aspirin 81 Electronic Signature(s) Signed: 10/14/2019 4:08:03 PM By: Army Melia Entered By: Army Melia on 10/14/2019 14:25:35 Brosh, [name] -------------------------------------------------------------------------------- Clinic Level of Care Assessment Details Patient Name: [name] Date of Service: 10/14/2019 2:00 PM Medical Record Number: [id number] Patient Account Number: [id number] Date of Birth/Sex: [birth year] (73 y.o. F) Treating RN: Cornell Barman Primary Care Quintel Mccalla: [name] Other Clinician: Referring Zandon Talton: [name] Treating Hajer Dwyer/Extender: Tito Dine in Treatment: 80 Clinic Level of Care Assessment Items TOOL 3 Quantity Score []  - Use when EandM and Procedure is performed on FOLLOW-UP visit 0 ASSESSMENTS - Nursing Assessment / Reassessment []  -  Reassessment of Co-morbidities (includes updates in patient status) 0 X- 1 5 Reassessment of Adherence to Treatment Plan ASSESSMENTS - Wound and Skin Assessment / Reassessment []  - Points for Wound Assessment can only be taken for a new wound of unknown or different 0 etiology and a procedure is NOT performed to that wound X- 1 5 Simple Wound Assessment / Reassessment - one wound []  - 0 Complex Wound Assessment / Reassessment - multiple wounds []  - 0 Dermatologic / Skin Assessment (not related to wound area) ASSESSMENTS - Focused Assessment []  - Circumferential Edema Measurements - multi extremities 0 []  - 0 Nutritional Assessment / Counseling / Intervention []  - 0 Lower Extremity Assessment (monofilament, tuning fork, pulses) []  - 0 Peripheral Arterial Disease Assessment (using hand held doppler) ASSESSMENTS - Ostomy and/or Continence Assessment and Care []  - Incontinence Assessment and Management 0 []  - 0 Ostomy Care Assessment and Management (repouching, etc.) PROCESS - Coordination of Care []  - Points for Discharge Coordination can only be taken for a new wound of unknown or different 0 etiology and a procedure is NOT performed to that wound X- 1 15 Simple Patient / Family Education for ongoing care []  - 0 Complex (extensive) Patient / Family Education for ongoing care []  - 0 Staff obtains Programmer, systems, Records, Test Results / Process Orders []  - 0 Staff telephones HHA, Nursing Homes / Clarify orders / etc []  - 0 Routine Transfer to another Facility (non-emergent condition) []  - 0 Routine Hospital Admission (non-emergent condition) CAYLEI, HAWKIN ([id number]) []  - 0 New Admissions / Biomedical engineer / Ordering NPWT, Apligraf, etc. []  - 0 Emergency Hospital Admission (emergent condition) X- 1 10 Simple Discharge Coordination []  - 0 Complex (extensive) Discharge Coordination PROCESS - Special Needs []  - Pediatric / Minor Patient Management 0 []  -  0 Isolation Patient Management []  - 0 Hearing / Language / Visual special needs []  - 0 Assessment of Community assistance (transportation, D/C planning, etc.) []  -  0 Additional assistance / Altered mentation []  - 0 Support Surface(s) Assessment (bed, cushion, seat, etc.) INTERVENTIONS - Wound Cleansing / Measurement []  - Points for Wound Cleaning / Measurement, Wound Dressing, Specimen Collection and 0 Specimen taken to lab can only be taken for a new wound of unknown or different etiology and a procedure is NOT performed to that wound X- 1 5 Simple Wound Cleansing - one wound []  - 0 Complex Wound Cleansing - multiple wounds []  - 0 Wound Imaging (photographs - any number of wounds) []  - 0 Wound Tracing (instead of photographs) []  - 0 Simple Wound Measurement - one wound []  - 0 Complex Wound Measurement - multiple wounds INTERVENTIONS - Wound Dressings []  - Small Wound Dressing one or multiple wounds 0 []  - 0 Medium Wound Dressing one or multiple wounds []  - 0 Large Wound Dressing one or multiple wounds INTERVENTIONS - Miscellaneous []  - External ear exam 0 []  - 0 Specimen Collection (cultures, biopsies, blood, body fluids, etc.) []  - 0 Specimen(s) / Culture(s) sent or taken to Lab for analysis []  - 0 Patient Transfer (multiple staff / Civil Service fast streamer / Similar devices) []  - 0 Simple Staple / Suture removal (25 or less) []  - 0 Complex Staple / Suture removal (26 or more) Botsford, Merlin J. ([id number]) []  - 0 Hypo / Hyperglycemic Management (close monitor of Blood Glucose) []  - 0 Ankle / Brachial Index (ABI) - do not check if billed separately []  - 0 Vital Signs Has the patient been seen at the hospital within the last three years: Yes Total Score: 40 Level Of Care: New/Established - Level 2 Electronic Signature(s) Signed: 10/14/2019 5:38:26 PM By: [name], [name], RN, CWS, [name], [name] Entered By: [name], [name], RN, CWS, Kim on 10/14/2019 15:22:04 [name]  ([id number]) -------------------------------------------------------------------------------- Lower Extremity Assessment Details Patient Name: Amber, [name]. Date of Service: 10/14/2019 2:00 PM Medical Record Number: [id number] Patient Account Number: [id number] Date of Birth/Sex: 1946/10/23 (73 y.o. F) Treating RN: Army Melia Primary Care Darrow Barreiro: [name] Other Clinician: Referring Kennadee Walthour: [name] Treating Edwin Cherian/Extender: Tito Dine in Treatment: 44 Edema Assessment Assessed: [Left: No] [Right: No] Edema: [Left: N] [Right: o] Calf Left: Right: Point of Measurement: 33 cm From Medial Instep 43 cm cm Ankle Left: Right: Point of Measurement: 10 cm From Medial Instep 24 cm cm Vascular Assessment Pulses: Dorsalis Pedis Palpable: [Left:Yes] Electronic Signature(s) Signed: 10/14/2019 4:08:03 PM By: Army Melia Entered By: Army Melia on 10/14/2019 14:35:35 Cothran, Mikela Lenna Sciara ([id number]) -------------------------------------------------------------------------------- Multi Wound Chart Details Patient Name: NAYZETH, RIDDER. Date of Service: 10/14/2019 2:00 PM Medical Record Number: [id number] Patient Account Number: [id number] Date of Birth/Sex: 17-Feb-1946 (73 y.o. F) Treating RN: Cornell Barman Primary Care Jordann Grime: [name] Other Clinician: Referring Shep Porter: [name] Treating Jaritza Duignan/Extender: Tito Dine in Treatment: 67 Vital Signs Height(in): 63 Pulse(bpm): 73 Weight(lbs): 224.7 Blood Pressure(mmHg): 155/77 Body Mass Index(BMI): 40 Temperature(F): 98.4 Respiratory Rate 16 (breaths/min): Photos: Wound Location: Left Lower Leg - Medial Left Lower Leg - Lateral Left Lower Leg - Lateral, Posterior Wounding Event: Gradually Appeared Gradually Appeared Gradually Appeared Primary Etiology: Lymphedema Venous Leg Ulcer Venous Leg Ulcer Comorbid History: Cataracts, Asthma, Sleep Cataracts, Asthma, Sleep  Cataracts, Asthma, Sleep Apnea, Deep Vein Apnea, Deep Vein Apnea, Deep Vein Thrombosis, Hypertension, Thrombosis, Hypertension, Thrombosis, Hypertension, Peripheral Venous Disease, Peripheral Venous Disease, Peripheral Venous Disease, Osteoarthritis, Received Osteoarthritis, Received Osteoarthritis, Received Chemotherapy, Received Chemotherapy, Received Chemotherapy, Received Radiation Radiation Radiation Date Acquired: 11/19/2018 01/19/2019 07/08/2019 Weeks of  Treatment: 44 38 14 Wound Status: Open Open Open Measurements L x W x D 2.5x2.3x0.2 3.5x3x0.2 0.7x0.5x0.1 (cm) Area (cm) : 4.516 8.247 0.275 Volume (cm) : 0.903 1.649 0.027 % Reduction in Area: 47.20% -3648.60% 51.30% % Reduction in Volume: -5.60% -7395.50% 52.60% Classification: Full Thickness Without Full Thickness Without Full Thickness Without Exposed Support Structures Exposed Support Structures Exposed Support Structures Exudate Amount: Large Large Large Exudate Type: Serosanguineous Serosanguineous Serosanguineous Exudate Color: red, brown red, brown red, brown Wound Margin: Flat and Intact Flat and Intact Flat and Intact Granulation Amount: Medium (34-66%) Medium (34-66%) Medium (34-66%) Granulation Quality: Pale Pink Pink Necrotic Amount: Medium (34-66%) Medium (34-66%) Medium (34-66%) Droz, Alyne J. ([id number]) Exposed Structures: Fat Layer (Subcutaneous Fat Layer (Subcutaneous Fat Layer (Subcutaneous Tissue) Exposed: Yes Tissue) Exposed: Yes Tissue) Exposed: Yes Fascia: No Fascia: No Fascia: No Tendon: No Tendon: No Tendon: No Muscle: No Muscle: No Muscle: No Joint: No Joint: No Joint: No Bone: No Bone: No Bone: No Epithelialization: None None None Treatment Notes Electronic Signature(s) Signed: 10/14/2019 6:09:55 PM By: Linton Ham MD Entered By: Linton Ham on 10/14/2019 16:53:32 Catano, [name]  ([id number]) -------------------------------------------------------------------------------- Multi-Disciplinary Care Plan Details Patient Name: JARETSSI, BRENNEKE. Date of Service: 10/14/2019 2:00 PM Medical Record Number: [id number] Patient Account Number: [id number] Date of Birth/Sex: [birth year] (73 y.o. F) Treating RN: Cornell Barman Primary Care Loral Campi: [name] Other Clinician: Referring Jahmiyah Dullea: [name] Treating Jaciel Diem/Extender: Tito Dine in Treatment: 69 Active Inactive Orientation to the Wound Care Program Nursing Diagnoses: Knowledge deficit related to the wound healing center program Goals: Patient/caregiver will verbalize understanding of the [place] Program Date Initiated: 12/10/2018 Target Resolution Date: 01/09/2019 Goal Status: Active Interventions: Provide education on orientation to the wound center Notes: Soft Tissue Infection Nursing Diagnoses: Impaired tissue integrity Goals: Patient's soft tissue infection will resolve Date Initiated: 12/10/2018 Target Resolution Date: 01/09/2019 Goal Status: Active Interventions: Assess signs and symptoms of infection every visit Notes: Venous Leg Ulcer Nursing Diagnoses: Actual venous Insuffiency (use after diagnosis is confirmed) Goals: Patient will maintain optimal edema control Date Initiated: 12/10/2018 Target Resolution Date: 01/09/2019 Goal Status: Active Interventions: Assess peripheral edema status every visit. MERIBETH, BIENER ([id number]) Treatment Activities: Therapeutic compression applied : 12/10/2018 Notes: Wound/Skin Impairment Nursing Diagnoses: Impaired tissue integrity Goals: Patient/caregiver will verbalize understanding of skin care regimen Date Initiated: 12/10/2018 Target Resolution Date: 01/09/2019 Goal Status: Active Interventions: Assess ulceration(s) every visit Treatment Activities: Topical wound management initiated : 12/10/2018 Notes: Electronic  Signature(s) Signed: 10/14/2019 5:38:26 PM By: [name], [name], RN, CWS, [name], [name] Entered By: [name], [name], RN, CWS, Kim on 10/14/2019 15:16:44 [name] -------------------------------------------------------------------------------- Pain Assessment Details Patient Name: TIANE, GRAVE. Date of Service: 10/14/2019 2:00 PM Medical Record Number: [id number] Patient Account Number: [id number] Date of Birth/Sex: Feb 16, 1946 (73 y.o. F) Treating RN: Army Melia Primary Care Lyly Canizales: [name] Other Clinician: Referring Baldemar Dady: [name] Treating Arneda Sappington/Extender: Tito Dine in Treatment: 44 Active Problems Location of Pain Severity and Description of Pain Patient Has Paino No Site Locations Pain Management and Medication Current Pain Management: Electronic Signature(s) Signed: 10/14/2019 4:08:03 PM By: Army Melia Entered By: Army Melia on 10/14/2019 14:25:41 Lisle, [name] ([id number]) -------------------------------------------------------------------------------- Patient/Caregiver Education Details Patient Name: DAMANI, BARDIN. Date of Service: 10/14/2019 2:00 PM Medical Record Number: [id number] Patient Account Number: [id number] Date of Birth/Gender: 1945-11-18 (73 y.o. F) Treating RN: Cornell Barman Primary Care Physician: [name] Other Clinician: Referring Physician: [name] Treating Physician/Extender: Linton Ham  G Weeks in Treatment: 1 Education Assessment Education Provided To: Patient Education Topics Provided Welcome To [facility]: Handouts: Welcome To [facility] Methods: Demonstration Responses: State content correctly Wound/Skin Impairment: Handouts: Caring for Your Ulcer Methods: Demonstration, Explain/Verbal Responses: State content correctly Electronic Signature(s) Signed: 10/14/2019 5:38:26 PM By: [name], [name], RN, CWS, [name], [name] Entered By: [name], [name], RN, CWS, Kim  on 10/14/2019 15:24:57 [name] -------------------------------------------------------------------------------- Wound Assessment Details Patient Name: MACKAY, PHIFER. Date of Service: 10/14/2019 2:00 PM Medical Record Number: [id number] Patient Account Number: [id number] Date of Birth/Sex: [birth year] (73 y.o. F) Treating RN: Army Melia Primary Care Sameul Tagle: [name] Other Clinician: Referring Yvette Loveless: [name] Treating Olamide Lahaie/Extender: Tito Dine in Treatment: 44 Wound Status Wound Number: 5 Primary Lymphedema Etiology: Wound Location: Left Lower Leg - Medial Wound Open Wounding Event: Gradually Appeared Status: Date Acquired: 11/19/2018 Comorbid Cataracts, Asthma, Sleep Apnea, Deep Vein Weeks Of Treatment: 44 History: Thrombosis, Hypertension, Peripheral Venous Clustered Wound: No Disease, Osteoarthritis, Received Chemotherapy, Received Radiation Photos Wound Measurements Length: (cm) 2.5 Width: (cm) 2.3 Depth: (cm) 0.2 Area: (cm) 4.516 Volume: (cm) 0.903 % Reduction in Area: 47.2% % Reduction in Volume: -5.6% Epithelialization: None Tunneling: No Undermining: No Wound Description Full Thickness Without Exposed Support Classification: Structures Wound Margin: Flat and Intact Exudate Large Amount: Exudate Type: Serosanguineous Exudate Color: red, brown Foul Odor After Cleansing: No Slough/Fibrino Yes Wound Bed Granulation Amount: Medium (34-66%) Exposed Structure Granulation Quality: Pale Fascia Exposed: No Necrotic Amount: Medium (34-66%) Fat Layer (Subcutaneous Tissue) Exposed: Yes Necrotic Quality: Adherent Slough Tendon Exposed: No Muscle Exposed: No Joint Exposed: No Bone Exposed: No GILIANA, NORBERG ([id number]) Electronic Signature(s) Signed: 10/14/2019 4:08:03 PM By: Army Melia Entered By: Army Melia on 10/14/2019 14:32:23 Simoni, [name]  ([id number]) -------------------------------------------------------------------------------- Wound Assessment Details Patient Name: LENIS, KOZAR. Date of Service: 10/14/2019 2:00 PM Medical Record Number: [id number] Patient Account Number: [id number] Date of Birth/Sex: 05-Jun-1946 (73 y.o. F) Treating RN: Army Melia Primary Care Saya Mccoll: [name] Other Clinician: Referring Christyna Letendre: [name] Treating Jatziry Wechter/Extender: Tito Dine in Treatment: 44 Wound Status Wound Number: 6 Primary Venous Leg Ulcer Etiology: Wound Location: Left Lower Leg - Lateral Wound Open Wounding Event: Gradually Appeared Status: Date Acquired: 01/19/2019 Comorbid Cataracts, Asthma, Sleep Apnea, Deep Vein Weeks Of Treatment: 38 History: Thrombosis, Hypertension, Peripheral Venous Clustered Wound: No Disease, Osteoarthritis, Received Chemotherapy, Received Radiation Photos Wound Measurements Length: (cm) 3.5 Width: (cm) 3 Depth: (cm) 0.2 Area: (cm) 8.247 Volume: (cm) 1.649 % Reduction in Area: -3648.6% % Reduction in Volume: -7395.5% Epithelialization: None Wound Description Full Thickness Without Exposed Support Classification: Structures Wound Margin: Flat and Intact Exudate Large Amount: Exudate Type: Serosanguineous Exudate Color: red, brown Foul Odor After Cleansing: No Slough/Fibrino Yes Wound Bed Granulation Amount: Medium (34-66%) Exposed Structure Granulation Quality: Pink Fascia Exposed: No Necrotic Amount: Medium (34-66%) Fat Layer (Subcutaneous Tissue) Exposed: Yes Necrotic Quality: Adherent Slough Tendon Exposed: No Muscle Exposed: No Joint Exposed: No Bone Exposed: No KATENIA, BROTMAN ([id number]) Electronic Signature(s) Signed: 10/14/2019 4:08:03 PM By: Army Melia Entered By: Army Melia on 10/14/2019 14:33:40 Elizarraraz, [name] ([id number]) -------------------------------------------------------------------------------- Wound  Assessment Details Patient Name: KELITA, BECHTEL. Date of Service: 10/14/2019 2:00 PM Medical Record Number: [id number] Patient Account Number: [id number] Date of Birth/Sex: 01-27-1946 (73 y.o. F) Treating RN: Army Melia Primary Care Trong Gosling: [name] Other Clinician: Referring Leander Tout: [name] Treating Chaniyah Jahr/Extender: [name] Dillon Weeks in Treatment: 82 Wound Status Wound Number:  9 Primary Venous Leg Ulcer Etiology: Wound Location: Left Lower Leg - Lateral, Posterior Wound Open Wounding Event: Gradually Appeared Status: Date Acquired: 07/08/2019 Comorbid Cataracts, Asthma, Sleep Apnea, Deep Vein Weeks Of Treatment: 14 History: Thrombosis, Hypertension, Peripheral Venous Clustered Wound: No Disease, Osteoarthritis, Received Chemotherapy, Received Radiation Photos Wound Measurements Length: (cm) 0.7 Width: (cm) 0.5 Depth: (cm) 0.1 Area: (cm) 0.275 Volume: (cm) 0.027 % Reduction in Area: 51.3% % Reduction in Volume: 52.6% Epithelialization: None Wound Description Full Thickness Without Exposed Support Classification: Structures Wound Margin: Flat and Intact Exudate Large Amount: Exudate Type: Serosanguineous Exudate Color: red, brown Foul Odor After Cleansing: No Slough/Fibrino Yes Wound Bed Granulation Amount: Medium (34-66%) Exposed Structure Granulation Quality: Pink Fascia Exposed: No Necrotic Amount: Medium (34-66%) Fat Layer (Subcutaneous Tissue) Exposed: Yes Necrotic Quality: Adherent Slough Tendon Exposed: No Muscle Exposed: No Joint Exposed: No Bone Exposed: No KIERNAN, DOTTERY ([id number]) Electronic Signature(s) Signed: 10/14/2019 4:08:03 PM By: Army Melia Entered By: Army Melia on 10/14/2019 14:34:38 Zoss, [name] ([id number]) -------------------------------------------------------------------------------- Vitals Details Patient Name: CAMRY, CASTLEMAN. Date of Service: 10/14/2019 2:00 PM Medical Record Number:  [id number] Patient Account Number: [id number] Date of Birth/Sex: September 04, 1946 (73 y.o. F) Treating RN: Army Melia Primary Care Harvie Morua: [name] Other Clinician: Referring Bruna Dills: [name] Treating Van [name]/Extender: Tito Dine in Treatment: 44 Vital Signs Time Taken: 14:25 Temperature (F): 98.4 Height (in): 63 Pulse (bpm): 87 Weight (lbs): 224.7 Respiratory Rate (breaths/min): 16 Body Mass Index (BMI): 39.8 Blood Pressure (mmHg): 155/77 Reference Range: 80 - 120 mg / dl Electronic Signature(s) Signed: 10/14/2019 4:08:03 PM By: Army Melia Entered By: Army Melia on 10/14/2019 14:27:38

## 2019-10-15 NOTE — Progress Notes (Signed)
[name] Visit Report for 10/14/2019 HPI Details Patient Name: [name], [name]. Date of Service: 10/14/2019 2:00 PM Medical Record Number: [id number] Patient Account Number: [id number] Date of Birth/Sex: [birth year] (73 y.o. F) Treating RN: Cornell Barman Primary Care Provider: [name] Other Clinician: Referring Provider: [name] Treating Provider/Extender: Tito Dine in Treatment: 64 History of Present Illness HPI Description: Pleasant 73 year old with history of chronic venous insufficiency. No diabetes or peripheral vascular disease. Left ABI 1.29. Questionable history of left lower extremity DVT. She developed a recurrent ulceration on her left lateral calf in December 2015, which she attributes to poor diet and subsequent lower extremity edema. She underwent endovenous laser ablation of her left greater saphenous vein in 2010. She underwent laser ablation of accessory branch of left GSV in April 2016 by Dr. Kellie Simmering at [facility]. She was previously wearing Unna boots, which she tolerated well. Tolerating 2 layer compression and cadexomer iodine. She returns to clinic for follow-up and is without new complaints. She denies any significant pain at this time. She reports persistent pain with pressure. No claudication or ischemic rest pain. No fever or chills. No drainage. READMISSION 11/13/16; this is a 73 year old woman who is not a diabetic. She is here for a review of a painful area on her left medial lower extremity. I note that she was seen here previously last year for wound I believe to be in the same area. At that time she had undergone previously a left greater saphenous vein ablation by Dr. Kellie Simmering and she had a ablation of the anterior accessory branch of the left greater saphenous vein in March 2016. Seeing that the wound actually closed over. In reviewing the history with her today the ulcer in this area has been recurrent. She  describes a biopsy of this area in 2009 that only showed stasis physiology. 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 Do not see that she has ever had arterial studies in the left leg. She tells me she has a follow-up with Dr. Kellie Simmering in roughly 10 days In any case she developed the reopening of this area roughly a month ago. On the background of this she describes rapidly increasing edema which has responded to Lasix 40 mg and metolazone 2.5 mg as well as the patient's lymph massage. She has been told she has both venous insufficiency and lymphedema but she cannot tolerate compression stockings 11/28/16; the patient saw Dr. Kellie Simmering recently. Per the patient he did arterial Dopplers in the office that did not show evidence of arterial insufficiency, per the patient he stated "treat this like an ordinary venous ulcer". She also saw her dermatologist Dr. Ronnald Ramp who felt that this was more of a vascular ulcer. In general things are improving although she arrives today with increasing bilateral lower extremity edema with weeping a deeper fluid through the wound on the left medial leg compatible with some degree of lymphedema 12/04/16; the patient's wound is fully epithelialized but I don't think fully healed. We will do another week of depression with Promogran and TCA however I suspect we'll be able to discharge her next week. This is a very unusual-looking wound which was initially a figure-of-eight type wound lying on its side surrounded by petechial like hemorrhage. She  has had venous ablation on this side. She apparently does not have an arterial issue per Dr. Kellie Simmering. She saw her dermatologist thought it was "vascular". Patient is definitely going to need ongoing compression and I  talked about this with her today she will go to elastic therapy after she leaves here next week 12/11/16; the patient's wound is not completely closed today. She has surrounding scar tissue and in further discussion with the patient it would appear that she had ulcers in this area in 2009 for a prolonged period of time ultimately requiring a punch biopsy of this area that only showed venous insufficiency. I did not previously pickup on this part of the history from the patient. 12/18/16; the patient's wound is completely epithelialized. There is no open area here. She has significant bilateral venous insufficiency with secondary lymphedema to a mild-to-moderate degree she does not have compression stockings.. She did not say anything to me when I was in the room, she told our intake nurse that she was still having pain in this area. This isn't SIDNEI, [name] ([id number]) unusual recurrent small open area. She is going to go to elastic therapy to obtain compression stockings. 12/25/16; the patient's wound is fully epithelialized. There is no open area here. The patient describes some continued episodic discomfort in this area medial left calf. However everything looks fine and healed here. She is been to elastic therapy and caught herself 15-20 mmHg stockings, they apparently were having trouble getting 20-30 mm stockings in her size 01/22/17; this is a patient we discharged from the clinic a month ago. She has a recurrent open wound on her medial left calf. She had 15 mm support stockings. I told her I thought she needed 20-30 mm compression stockings. She tells me that she has been ill with hospitalization secondary to asthma and is been found to have severe hypokalemia likely secondary to a combination of Lasix and metolazone. This morning she noted blistering and leaking fluid on the posterior part of her left leg. She called our intake nurse urgently and we was saw her this afternoon. She has not  had any real discomfort here. I don't know that she's been wearing any stockings on this leg for at least 2-3 days. ABIs in this clinic were 1.21 on the right and 1.3 on the left. She is previously seen vascular surgery who does not think that there is a peripheral arterial issue. 01/30/17; Patient arrives with no open wound on the left leg. She has been to elastic therapy and obtained 20-40mmhg below knee stockings and she has one on the right leg today. READMISSION 02/19/18; this Rennison is a now 73 year old patient we've had in this clinic perhaps 3 times before. I had last looked at her from January 07 December 2016 with an area on the medial left leg. We discharged her on 12/25/16 however she had to be readmitted on 01/22/17 with a recurrence. I have in my notes that we discharged her on 20-30 mm stockings although she tells me she was only wearing support hose because she cannot get stockings on predominantly related to her cervical spine surgery/issues. She has had previous ablations done by vein and vascular in [place] including a great saphenous vein ablation on the left with an anterior accessory branch ablation I think both of these were in 2016. On one of the previous visit she had a biopsy noted 2009 that was negative. She is not felt to have an arterial issue. She is not  a diabetic. She does have a history of obstructive sleep apnea hypertension asthma as well as chronic venous insufficiency and lymphedema. On this occasion she noted 2 dry scaly patch on her left leg. She tried to put lotion on this it didn't really help. There were 2 open areas.the patient has been seeing her primary physician from 02/05/18 through 02/14/18. She had Unna boots applied. The superior wound now on the lateral left leg has closed but she's had one wound that remains open on the lateral left leg. This is not the same spot as we dealt with in 2018. ABIs in this clinic were 1.3 bilaterally 02/26/18; patient has a  small wound on the left lateral calf. Dimensions are down. She has chronic venous insufficiency and lymphedema. 03/05/18; small open area on the left lateral calf. Dimensions are down. Tightly adherent necrotic debris over the surface of the wound which was difficult to remove. Also the dressing [over collagen] stuck to the wound surface. This was removed with some difficulty as well. Change the primary dressing to Hydrofera Blue ready 03/12/18; small open area on the left lateral calf. Comes in with tightly adherent surface eschar as well as some adherent Hydrofera Blue. 03/19/18; open area on the left lateral calf. Again adherent surface eschar as well as some adherent Hydrofera Blue nonviable subcutaneous tissue. She complained of pain all week even with the reduction from 4-3 layer compression I put on last week. Also she had an increase in her ankle and calf measurements probably related to the same thing. 03/26/18; open area on the left lateral calf. A very small open area remains here. We used silver alginate starting last week as the Hydrofera Blue seem to stick to the wound bed. In using 4-layer compression 04/02/18; the open area in the left lateral calf at some adherent slough which I removed there is no open area here. We are able to transition her into her own compression stocking. Truthfully I think this is probably his support hose. However this does not maintain skin integrity will be limited. She cannot put over the toe compression stockings on because of neck problems hand problems etc. She is allergic to the lining layer of juxta lites. We might be forced to use extremitease stocking should this fail READMIT 11/24/2018 Patient is now a 73 year old woman who is not a diabetic. She has been in this clinic on at least 3 previous occasions largely with recurrent wounds on her left leg secondary to chronic venous insufficiency with secondary lymphedema. Her situation is complicated by  inability to get stockings on and an allergy to neoprene which is apparently a component and at least juxta lites and other stockings. As a result she really has not been wearing any stockings on her legs. She tells [place] that roughly 2 or 3 weeks ago she started noticing a stinging sensation just above her ankle on the left medial aspect. She has been diagnosed with pseudogout and she wondered whether this was what she was experiencing. She tried to dress this with something she bought at the store however subsequently it pulled skin off and now she has an open wound that is not improving. She has been using Vaseline gauze with a cover bandage. She saw her primary doctor last week who MONTRESE, RAWLINSON ([id number]) put an Unna boot on her. ABIs in this clinic was 1.03 on the left 2/12; the area is on the left medial ankle. Odd-looking wound with what looks to be surface epithelialization  but a multitude of small petechial openings. This clearly not closed yet. We have been using silver alginate under 3 layer compression with TCA 2/19; the wound area did not look quite as good this week. Necrotic debris over the majority of the wound surface which required debridement. She continues to have a multitude of what looked to be small petechial openings. She reminds [place] that she had a biopsy on this initially during her first outbreak in 2015 in [place] [facility]. She expresses concern about this being a possible melanoma. 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 I assured her that this does not look anything like melanoma. Except for the petechial reaction it does look like a venous insufficiency area and she certainly has evidence of this on both sides 2/26; a difficult area on the left medial ankle. The patient clearly has chronic venous hypertension with some degree of lymphedema. The odd thing about the area is  the small petechial hemorrhages. I am not really sure how to explain this. This was present last time and this is not a compression injury. We have been using Hydrofera Blue which I changed to last week 3/4; still using Hydrofera Blue. Aggressive debridement today. She does not have known arterial issues. She has seen Dr. Kellie Simmering at [facility] vein and vascular and and has an ablation on the left. [Anterior accessory branch of the greater saphenous]. From what I remember they did not feel she had an arterial issue. The patient has had this area biopsied in 2009 at [facility] dermatology and by her recollection they said this was "stasis". She is also follow-up with dermatology locally who thought that this was more of a vascular issue 3/11; using Hydrofera Blue. Aggressive debridement today. She does not have an arterial issue. We are using 3 layer compression although we may need to go to 4. The patient has been in for multiple changes to her wrap since I last saw her a week ago. She says that the area was leaking. I do not have too much more information on what was found 01/19/19 on evaluation today patient was actually being seen for a nurse visit when unfortunately she had the area on her left lateral lower extremity as well as weeping from the right lower extremity that became apparent. Therefore we did end up actually seeing her for a full visit with myself. She is having some pain at this site as well but fortunately nothing too significant at this point. No fevers, chills, nausea, or vomiting noted at this time. 3/18-Patient is back to the clinic with the left leg venous leg ulcer, the ulcer is larger in size, has a surface that is densely adherent with fibrinous tissue, the Hydrofera Blue was used but is densely adherent and there was difficulty in removing it. The right lower extremity was also wrapped for weeping edema. Patient has a new area over the left lateral foot above the malleolus  that is small and appears to have no debris with intact surrounding skin. Patient is on increased dose of Lasix also as a means to edema management 3/25; the patient has a nonhealing venous ulcer on the medial left leg and last week developed a smaller area on the lateral left calf. We have been using Hydrofera Blue with a contact layer. 4/1; no major change in these wounds areas. Left medial and more recently left lateral calf. I tried Iodoflex last week  to aid in debridement she did not tolerate this. She stated her pain was terrible all week. She took the top layer of the 4 layer compression off. 4/8; the patient actually looks somewhat better in terms of her more prominent left lateral calf wound. There is some healthy looking tissue here. She is still complaining of a lot of discomfort. 4/15; patient in a lot of pain secondary to sciatica. She is on a prednisone taper prescribed by her primary physician. She has the 2 areas one on the left medial and more recently a smaller area on the left lateral calf. Both of these just above the malleoli 4/22; her back pain is better but she still states she is very uncomfortable and now feels she is intolerant to the [employer]. No real change in the wounds we have been using Sorbact. She has been previously intolerant to Iodoflex. There is not a lot of option about what we can use to debride this wound under compression that she no doubt needs. sHe states Ultram no longer works for her pain 4/29; no major change in the wounds slightly increased depth. Surface on the original medial wound perhaps somewhat improved however the more recent area on the lateral left ankle is 100% covered in very adherent debris we have been using Sorbact. She tolerates 4 layer compression well and her edema control is a lot better. She has not had to come in for a nurse check 5/6; no major change in the condition of the wounds. She did consent to debridement today which was  done with some difficulty. Continuing Sorbact. She did not tolerate Iodoflex. She was in for a check of her compression the day after we wrapped her last week this was adjusted but nothing much was found 5/13; no major change in the condition or area of the wounds. I was able to get a fairly aggressive debridement done on the lateral left leg wound. Even using Sorbact under compression. She came back in on Friday to have the wrap changed. She says she felt uncomfortable on the lateral aspect of her ankle. She has a long history of chronic venous insufficiency including previous ablation surgery on this side. 5/20-Patient returns for wounds on left leg with both wounds covered in slough, with the lateral leg wound larger in size, she has been in 3 layer compression and felt more comfortable, she describes pain in ankle, in leg and pins and needles in foot, Crossno, Charmayne J. ([id number]) and is about to try Pamelor for this 6/3; wounds on the left lateral and left medial leg. The area medially which is the most recent of the 2 seems to have had the largest increase in dimensions. We have been using Sorbac to try and debride the surface. She has been to see orthopedics they apparently did a plain x-ray that was indeterminant. Diagnosed her with neuropathy and they have ordered an MRI to determine if there is underlying osteomyelitis. This was not high on my thought list but I suppose it is prudent. We have advised her to make an appointment with vein and vascular in [place]. She has a history of a left greater saphenous and accessory vein ablations I wonder if there is anything else that can be done from a surgical point of view to help in these difficult refractory wounds. We have previously healed this wound on one occasion but it keeps on reopening [medial side] 6/10; deep tissue culture I did last week I think on the  left medial wound showed both moderate E. coli and moderate staph aureus  [MSSA]. She is going to require antibiotics and I have chosen Augmentin. We have been using Sorbact and we have made better looking wound surface on both sides but certainly no improvement in wound area. She was back in last Friday apparently for a dressing changes the wrap was hurting her outer left ankle. She has not managed to get a hold of vein and vascular in [place]. We are going to have to make her that appointment 6/17; patient is tolerating the Augmentin. She had an MRI that I think was ordered by orthopedic surgeon this did not show osteomyelitis or an abscess did suggest cellulitis. We have been using Sorbact to the lateral and medial ankles. We have been trying to arrange a follow-up appointment with vein and vascular in [place] or did her original ablations. We apparently an area sent the request to vein and vascular in [facility] 6/24; patient has completed the Augmentin. We do not yet have a vein and vascular appointment in [place]. I am not sure what the issue is here we have asked her to call tomorrow. We are using Sorbact. Making some improvements and especially the medial wound. Both surfaces however look better medial and lateral. 7/1; the patient has been in contact with vein and vascular in [place] but has not yet received an appointment. Using Sorbact we have gradually improve the wound surface with no improvement in surface area. She is approved for Apligraf but the wound surface still is not completely viable. She has not had to come in for a dressing change 7/8; the patient has an appointment with vein and vascular on 7/31 which is a Friday afternoon. She is concerned about getting back here for [place] to dress her wounds. I think it is important to have them goal for her venous reflux/history of ablations etc. to see if anything else can be done. She apparently tested positive for 1 of the blood tests with regards to lupus and saw a rheumatologist. He has raised  the issue of vasculitis again. I have had this thought in the past however the evidence seems overwhelming that this is a venous reflux etiology. If the rheumatologist tells me there is clinical and laboratory investigation is positive for lupus I will rethink this. 7/15; the patient's wound surfaces are quite a bit better. The medial area which was her original wound now has no depth although the lateral wound which was the more recent area actually appears larger. Both with viable surfaces which is indeed better. Using Sorbact. I wanted to use Apligraf on her however there is the issue of the vein and vascular appointment on 7/31 at 2:00 in the afternoon which would not allow her to get back to be rewrapped and they would no doubt remove the graft 7/22; the patient's wound surfaces have moderate amount of debris although generally look better. The lateral one is larger with 2 small satellite areas superiorly. We are waiting for her vein and vascular appointment on 7/31. She has been approved for Apligraf which I would like to use after th 7/29; wound surfaces have improved no debridement is required we have been using Sorbact. She sees vein and vascular on Friday with this so question of whether anything can be done to lessen the likelihood of recurrence and/or speed the healing of these areas. She is already had previous ablations. She no doubt has severe venous hypertension 8/5-Patient returns at 1 week, she  was in [employer] for 3 days by her podiatrist, we have been using so backed to the wound, she has increased pain in both the wounds on the left lower leg especially the more distal one on the lateral aspect 8/12-Patient returns at 1 week and she is agreeable to having debridement in both wounds on her left leg today. We have been using Sorbact, and vascular studies were reviewed at last visit 8/19; the patient arrives with her wounds fairly clean and no debridement is required. We have used  Sorbact which is really done a nice job in cleaning up these very difficult wound surfaces. The patient saw Dr. Donzetta Matters of vascular surgery on 7/31. He did not feel that there was an arterial component. He felt that her treated greater saphenous vein is adequately addressed and that the small saphenous vein did not appear to be involved significantly. She was also noted to have deep venous reflux which is not treatable. Dr. Donzetta Matters mentioned the possibility of a central obstructive component leading to reflux and he offered her central venography. She wanted to discuss this or think about it. I have urged her to go ahead with this. She has had recurrent difficult wounds in these areas which do heal but after months in the clinic. If there is anything that can be done to reduce the likelihood of this I think it is worth it. 9/2 she is still working towards getting follow-up with Dr. Donzetta Matters to schedule her CT. Things are quite a bit worse venography. I put Apligraf on 2 weeks ago on both wounds on the medial and lateral part of her left lower leg. She arrives in clinic today with 3 superficial additional wounds above the area laterally and one below the wound medially. She describes a lot of discomfort. I think these are probably wrapped injuries. Does not look like she has cellulitis. 07/20/2019 on evaluation today patient appears to be doing somewhat poorly in regard to her lower extremity ulcers. She in fact showed signs of erythema in fact we may even be dealing with an infection at this time. Unfortunately I am unsure if this CHAELI, VILLAROSA ([id number]) is just infection or if indeed there may be some allergic reaction that occurred as a result of the Apligraf application. With that being said that would be unusual but nonetheless not impossible in this patient is one who is unfortunately allergic to quite a bit. Currently we have been using the Sorbact which seems to do as well as anything for her. I do  think we may want to obtain a culture today to see if there is anything showing up there that may need to be addressed. 9/16; noted that last week the wounds look worse in 1 week follow-up of the Apligraf. Using Sorbact as of 2 days ago. She arrives with copious amounts of drainage and new skin breakdown on the back of the left calf. The wounds arm more substantial bilaterally. There is a fair amount of swelling in the left calf no overt DVT there is edema present I think in the left greater than right thigh. She is supposed to go on 9/28 for CT venography. The wounds on the medial and lateral calf are worse and she has new skin breakdown posteriorly at least new for me. This is almost developing into a circumferential wound area The Apligraf was taken off last week which I agree with things are not going in the right direction a culture was done we  do not have that back yet. She is on Augmentin that she started 2 days ago 9/23; dressing was changed by her nurses on Monday. In general there is no improvement in the wound areas although the area looks less angry than last week. She did get Augmentin for MSSA cultured on the 14th. She still appears to have too much swelling in the left leg even with 3 layer compression 9/30; the patient underwent her procedure on 9/28 by Dr. Donzetta Matters [facility]. She was discovered to have the common iliac vein measuring 12.2 mm but at the level of L4-L5 measured 3 mm. After stenting it measured 10 mm. 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 Rouleaux flow in the common femoral and femoral vein was observed much improved after stenting. We are using silver alginate to the wounds on the medial and lateral ankle on the left. 4 layer compression 10/7; the patient had fluid swelling around her knee and 4 layer compression. At the advice of vein and vascular this was reduced to 3 layer which she is tolerating better. We have been using  silver alginate under 3 layer compression since last Friday 10/14; arrives with the areas on the left ankle looking a lot better. Inflammation in the area also a lot better. She came in for a nurse check on 10/9 10/21; continued nice improvement. Slight improvements in surface area of both the medial and lateral wounds on the left. A lot of the satellite lesions in the weeping erythema around these from stasis dermatitis is resolved. We have been using silver alginate 10/28; general improvement in the entire wound areas although not a lot of change in dimensions the wound certainly looks better. There is a lot less in terms of venous inflammation. Continue silver alginate this week however look towards Hydrofera Blue next week 11/4; very adherent debris on the medial wound left wound is not as bad. We have been using silver alginate. Change to [facility] today 11/11; very adherent debris on both wound areas. She went to vein and vascular last week and follow-up they put in [place] boot on this today. He says the [facility] was adherent. Wound is definitely not as good as last week. Especially on the left there the satellite lesions look more prominent 11/18; absolutely no better. erythema on lateral aspect with tenderness. 09/30/2019 on evaluation today patient appears to actually be doing better. Dr. Dellia Nims did put her on doxycycline last week which I do believe has helped her at this point. Fortunately there is no signs of active infection at this time. No fevers, chills, nausea, vomiting, or diarrhea. I do believe he may want extend the doxycycline for 7 additional days just to ensure everything does completely cleared up the patient is in agreement with that plan. Otherwise she is going require some sharp debridement today 12/2; patient is completing a 2-week course of doxycycline. I gave her this empirically for inflammation as well as infection when I last saw her 2 weeks ago. All of  this seems to be better. She is using silver alginate she has the area on the medial aspect of the larger area laterally and the 2 small satellite regions laterally above the major wound. 12/9; the patient's wound on the left medial and left lateral calf look really quite good. We have been using silver alginate. She saw vein and vascular in follow-up on 10/09/2019. She has had a previous left greater saphenous vein ablation by  Dr. Oscar La in 2016. More recently she underwent a left common iliac vein stent by Dr. 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 The swelling is improved and certainly the wounds have improved. The patient shows [place] today area on the right medial calf there is almost no wound but leaking lymphedema. She says she start this started 3 or 4 days ago. She did not traumatize it. It is not painful. She does not wear compression on that side Electronic Signature(s) Signed: 10/14/2019 6:09:55 PM By: Linton Ham MD TANNA, BARTEK ([id number]) Entered By: Linton Ham on 10/14/2019 Yetter, Wellington ([id number]) -------------------------------------------------------------------------------- Physical Exam Details Patient Name: [name], [name]. Date of Service: 10/14/2019 2:00 PM Medical Record Number: [id number] Patient Account Number: [id number] Date of Birth/Sex: 01-31-1946 (73 y.o. F) Treating RN: Cornell Barman Primary Care Provider: [name] Other Clinician: Referring Provider: [name] Treating Provider/Extender: Tito Dine in Treatment: 85 Constitutional Patient is hypertensive.. Pulse regular and within target range for patient.Marland Kitchen Respirations regular, non-labored and within target range.Marland Kitchen appears in no distress. Eyes Conjunctivae clear. No discharge. Respiratory Respiratory effort is easy and symmetric bilaterally. Rate is normal at rest and on room air.. Cardiovascular Pedal pulses are palpable. Edema control seems  fairly adequate bilaterally she has lymphedema with lipodermatosclerosis. Lymphatic None palpable in the popliteal area bilaterally. Integumentary (Hair, Skin) Under illumination on the right lateral lower leg there is a weeping area. Even with the light it was difficult to see a wound per se. Notes Wound exam oThe wounds on her left lateral and left medial lower leg look really quite good. The area laterally is smaller. Most no debridement is necessary oOn the right medial calf she has a weeping area that does not have a clear wound but definitely will need compression. She will need stockings Electronic Signature(s) Signed: 10/14/2019 6:09:55 PM By: Linton Ham MD Entered By: Linton Ham on 10/14/2019 17:07:32 Mottley, [name] -------------------------------------------------------------------------------- Physician Orders Details Patient Name: [name], [name]. Date of Service: 10/14/2019 2:00 PM Medical Record Number: [id number] Patient Account Number: [id number] Date of Birth/Sex: [birth year] (73 y.o. F) Treating RN: Cornell Barman Primary Care Provider: [name] Other Clinician: Referring Provider: [name] Treating Provider/Extender: Tito Dine in Treatment: 65 Verbal / Phone Orders: No Diagnosis Coding Wound Cleansing Wound #5 Left,Medial Lower Leg o Cleanse wound with mild soap and water Wound #6 Left,Lateral Lower Leg o Cleanse wound with mild soap and water Wound #9 Left,Lateral,Posterior Lower Leg o Cleanse wound with mild soap and water Anesthetic (add to Medication List) Wound #5 Left,Medial Lower Leg o Topical Lidocaine 4% cream applied to wound bed prior to debridement (In Clinic Only). Wound #6 Left,Lateral Lower Leg o Topical Lidocaine 4% cream applied to wound bed prior to debridement (In Clinic Only). Skin Barriers/Peri-Wound Care Wound #5 Left,Medial Lower Leg o Triamcinolone Acetonide Ointment  (TCA) Wound #6 Left,Lateral Lower Leg o Triamcinolone Acetonide Ointment (TCA) Wound #9 Left,Lateral,Posterior Lower Leg o Triamcinolone Acetonide Ointment (TCA) Primary Wound Dressing Wound #5 Left,Medial Lower Leg o Silver Alginate - adaptic for sticking Wound #6 Left,Lateral Lower Leg o Silver Alginate - adaptic for sticking Wound #9 Left,Lateral,Posterior Lower Leg o Silver Alginate - adaptic for sticking Secondary Dressing Wound #5 Left,Medial Lower Leg o Drawtex Wound #6 Left,Lateral Lower Leg o Drawtex Leitzel, Tekila J. ([id number]) Wound #9 Left,Lateral,Posterior Lower Leg o Drawtex Dressing Change Frequency Wound #5 Left,Medial Lower Leg o Change dressing every week o Other: - as needed.  Wound #6 Left,Lateral Lower Leg o Change dressing every week o Other: - as needed. Wound #9 Left,Lateral,Posterior Lower Leg o Change dressing every week o Other: - as needed. Follow-up Appointments Wound #5 Left,Medial Lower Leg o Return Appointment in 1 week. o Nurse Visit as needed - Call of needed Wound #6 Left,Lateral Lower Leg o Return Appointment in 1 week. o Nurse Visit as needed - Call of needed Wound #9 Left,Lateral,Posterior Lower Leg o Return Appointment in 1 week. o Nurse Visit as needed - Call of needed Edema Control Wound #5 Left,Medial Lower Leg o 3 Layer Compression System - Bilateral - unna to anchor Wound #6 Left,Lateral Lower Leg o 3 Layer Compression System - Bilateral - unna to anchor Wound #9 Left,Lateral,Posterior Lower Leg o 3 Layer Compression System - Bilateral - unna to anchor, Right-Lymphedema drainage Electronic Signature(s) Signed: 10/14/2019 5:38:26 PM By: [name], [name], RN, CWS, [name], [name] Signed: 10/14/2019 6:09:55 PM By: Linton Ham MD Entered By: [name], [name], RN, CWS, Kim on 10/14/2019 15:24:31 Baris, [name]  ([id number]) -------------------------------------------------------------------------------- Problem List Details Patient Name: NATANIA, TOYNE. Date of Service: 10/14/2019 2:00 PM Medical Record Number: [id number] Patient Account Number: [id number] Date of Birth/Sex: April 23, 1946 (73 y.o. F) Treating RN: Cornell Barman Primary Care Provider: [name] Other Clinician: Referring Provider: [name] Treating Provider/Extender: Tito Dine in Treatment: 5 Active Problems ICD-10 Evaluated Encounter Code Description Active Date Today Diagnosis L97.221 Non-pressure chronic ulcer of left calf limited to breakdown of 01/07/2019 No Yes skin I87.321 Chronic venous hypertension (idiopathic) with inflammation of 12/10/2018 No Yes right lower extremity I89.0 Lymphedema, not elsewhere classified 12/10/2018 No Yes L97.818 Non-pressure chronic ulcer of other part of right lower leg 10/14/2019 No Yes with other specified severity Inactive Problems Resolved Problems Electronic Signature(s) Signed: 10/14/2019 6:09:55 PM By: Linton Ham MD Entered By: Linton Ham on 10/14/2019 16:53:15 Wrubel, [name] -------------------------------------------------------------------------------- Progress Note Details Patient Name: ELLIYAH, QUINBY. Date of Service: 10/14/2019 2:00 PM Medical Record Number: [id number] Patient Account Number: [id number] Date of Birth/Sex: 1945-11-10 (73 y.o. F) Treating RN: Cornell Barman Primary Care Provider: [name] Other Clinician: Referring Provider: [name] Treating Provider/Extender: Tito Dine in Treatment: 47 Subjective History of Present Illness (HPI) Pleasant 73 year old with history of chronic venous insufficiency. No diabetes or peripheral vascular disease. Left ABI 1.29. Questionable history of left lower extremity DVT. She developed a recurrent ulceration on her left lateral calf in December 2015, which  she attributes to poor diet and subsequent lower extremity edema. She underwent endovenous laser ablation of her left greater saphenous vein in 2010. She underwent laser ablation of accessory branch of left GSV in April 2016 by Dr. Kellie Simmering at [facility]. She was previously wearing Unna boots, which she tolerated well. Tolerating 2 layer compression and cadexomer iodine. She returns to clinic for follow-up and is without new complaints. She denies any significant pain at this time. She reports persistent pain with pressure. No claudication or ischemic rest pain. No fever or chills. No drainage. READMISSION 11/13/16; this is a 73 year old woman who is not a diabetic. She is here for a review of a painful area on her left medial lower extremity. I note that she was seen here previously last year for wound I believe to be in the same area. At that time she had undergone previously a left greater saphenous vein ablation by Dr. Kellie Simmering and she had a ablation of the anterior accessory branch of the left greater saphenous  vein in March 2016. Seeing that the wound actually closed over. In reviewing the history with her today the ulcer in this area has been recurrent. She describes a biopsy of this area in 2009 that only showed stasis physiology. 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 Do not see that she has ever had arterial studies in the left leg. She tells me she has a follow-up with Dr. Kellie Simmering in roughly 10 days In any case she developed the reopening of this area roughly a month ago. On the background of this she describes rapidly increasing edema which has responded to Lasix 40 mg and metolazone 2.5 mg as well as the patient's lymph massage. She has been told she has both venous  insufficiency and lymphedema but she cannot tolerate compression stockings 11/28/16; the patient saw Dr. Kellie Simmering recently. Per the patient he did arterial Dopplers in the office that did not show evidence of arterial insufficiency, per the patient he stated "treat this like an ordinary venous ulcer". She also saw her dermatologist Dr. Ronnald Ramp who felt that this was more of a vascular ulcer. In general things are improving although she arrives today with increasing bilateral lower extremity edema with weeping a deeper fluid through the wound on the left medial leg compatible with some degree of lymphedema 12/04/16; the patient's wound is fully epithelialized but I don't think fully healed. We will do another week of depression with Promogran and TCA however I suspect we'll be able to discharge her next week. This is a very unusual-looking wound which was initially a figure-of-eight type wound lying on its side surrounded by petechial like hemorrhage. She has had venous ablation on this side. She apparently does not have an arterial issue per Dr. Kellie Simmering. She saw her dermatologist thought it was "vascular". Patient is definitely going to need ongoing compression and I talked about this with her today she will go to elastic therapy after she leaves here next week 12/11/16; the patient's wound is not completely closed today. She has surrounding scar tissue and in further discussion with the patient it would appear that she had ulcers in this area in 2009 for a prolonged period of time ultimately requiring a punch biopsy of this area that only showed venous insufficiency. I did not previously pickup on this part of the history from the patient. 12/18/16; the patient's wound is completely epithelialized. There is no open area here. She has significant bilateral venous insufficiency with secondary lymphedema to a mild-to-moderate degree she does not have compression stockings.. She did not say anything to me when  I was in the room, she told our intake nurse that she was still having pain in this area. This isn't unusual recurrent small open area. She is going to go to elastic therapy to obtain compression stockings. 12/25/16; the patient's wound is fully epithelialized. There is no open area here. The patient describes some continued episodic discomfort in this area medial left calf. However everything looks fine and healed here. She is been to elastic therapy and KRYSLYN, BITTING ([id number]) caught herself 15-20 mmHg stockings, they apparently were having trouble getting 20-30 mm stockings in her size 01/22/17; this is a patient we discharged from the clinic a month ago. She has a recurrent open wound  on her medial left calf. She had 15 mm support stockings. I told her I thought she needed 20-30 mm compression stockings. She tells me that she has been ill with hospitalization secondary to asthma and is been found to have severe hypokalemia likely secondary to a combination of Lasix and metolazone. This morning she noted blistering and leaking fluid on the posterior part of her left leg. She called our intake nurse urgently and we was saw her this afternoon. She has not had any real discomfort here. I don't know that she's been wearing any stockings on this leg for at least 2-3 days. ABIs in this clinic were 1.21 on the right and 1.3 on the left. She is previously seen vascular surgery who does not think that there is a peripheral arterial issue. 01/30/17; Patient arrives with no open wound on the left leg. She has been to elastic therapy and obtained 20-42mmhg below knee stockings and she has one on the right leg today. READMISSION 02/19/18; this Mcguiness is a now 73 year old patient we've had in this clinic perhaps 3 times before. I had last looked at her from January 07 December 2016 with an area on the medial left leg. We discharged her on 12/25/16 however she had to be readmitted on 01/22/17 with a recurrence.  I have in my notes that we discharged her on 20-30 mm stockings although she tells me she was only wearing support hose because she cannot get stockings on predominantly related to her cervical spine surgery/issues. She has had previous ablations done by vein and vascular in [place] including a great saphenous vein ablation on the left with an anterior accessory branch ablation I think both of these were in 2016. On one of the previous visit she had a biopsy noted 2009 that was negative. She is not felt to have an arterial issue. She is not a diabetic. She does have a history of obstructive sleep apnea hypertension asthma as well as chronic venous insufficiency and lymphedema. On this occasion she noted 2 dry scaly patch on her left leg. She tried to put lotion on this it didn't really help. There were 2 open areas.the patient has been seeing her primary physician from 02/05/18 through 02/14/18. She had Unna boots applied. The superior wound now on the lateral left leg has closed but she's had one wound that remains open on the lateral left leg. This is not the same spot as we dealt with in 2018. ABIs in this clinic were 1.3 bilaterally 02/26/18; patient has a small wound on the left lateral calf. Dimensions are down. She has chronic venous insufficiency and lymphedema. 03/05/18; small open area on the left lateral calf. Dimensions are down. Tightly adherent necrotic debris over the surface of the wound which was difficult to remove. Also the dressing [over collagen] stuck to the wound surface. This was removed with some difficulty as well. Change the primary dressing to Hydrofera Blue ready 03/12/18; small open area on the left lateral calf. Comes in with tightly adherent surface eschar as well as some adherent Hydrofera Blue. 03/19/18; open area on the left lateral calf. Again adherent surface eschar as well as some adherent Hydrofera Blue nonviable subcutaneous tissue. She complained of pain all week  even with the reduction from 4-3 layer compression I put on last week. Also she had an increase in her ankle and calf measurements probably related to the same thing. 03/26/18; open area on the left lateral calf. A very small open area remains  here. We used silver alginate starting last week as the Hydrofera Blue seem to stick to the wound bed. In using 4-layer compression 04/02/18; the open area in the left lateral calf at some adherent slough which I removed there is no open area here. We are able to transition her into her own compression stocking. Truthfully I think this is probably his support hose. However this does not maintain skin integrity will be limited. She cannot put over the toe compression stockings on because of neck problems hand problems etc. She is allergic to the lining layer of juxta lites. We might be forced to use extremitease stocking should this fail READMIT 11/24/2018 Patient is now a 73 year old woman who is not a diabetic. She has been in this clinic on at least 3 previous occasions largely with recurrent wounds on her left leg secondary to chronic venous insufficiency with secondary lymphedema. Her situation is complicated by inability to get stockings on and an allergy to neoprene which is apparently a component and at least juxta lites and other stockings. As a result she really has not been wearing any stockings on her legs. She tells [place] that roughly 2 or 3 weeks ago she started noticing a stinging sensation just above her ankle on the left medial aspect. She has been diagnosed with pseudogout and she wondered whether this was what she was experiencing. She tried to dress this with something she bought at the store however subsequently it pulled skin off and now she has an open wound that is not improving. She has been using Vaseline gauze with a cover bandage. She saw her primary doctor last week who put an Haematologist on her. ABIs in this clinic was 1.03 on the  left ALYSHIA, [name]. ([id number]) 2/12; the area is on the left medial ankle. Odd-looking wound with what looks to be surface epithelialization but a multitude of small petechial openings. This clearly not closed yet. We have been using silver alginate under 3 layer compression with TCA 2/19; the wound area did not look quite as good this week. Necrotic debris over the majority of the wound surface which required debridement. She continues to have a multitude of what looked to be small petechial openings. She reminds [place] that she had a biopsy on this initially during her first outbreak in 2015 in [place] [facility]. She expresses concern about this being a possible melanoma. 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 I assured her that this does not look anything like melanoma. Except for the petechial reaction it does look like a venous insufficiency area and she certainly has evidence of this on both sides 2/26; a difficult area on the left medial ankle. The patient clearly has chronic venous hypertension with some degree of lymphedema. The odd thing about the area is the small petechial hemorrhages. I am not really sure how to explain this. This was present last time and this is not a compression injury. We have been using Hydrofera Blue which I changed to last week 3/4; still using Hydrofera Blue. Aggressive debridement today. She does not have known arterial issues. She has seen Dr. Kellie Simmering at [facility] vascular and and has an ablation on the left. [Anterior accessory branch of the greater saphenous]. From what I remember they did not feel she had an arterial issue. The patient has had this area biopsied in 2009 at [facility] dermatology and by her  recollection they said this was "stasis". She is also follow-up with dermatology locally who thought that this was more of a vascular issue 3/11;  using Hydrofera Blue. Aggressive debridement today. She does not have an arterial issue. We are using 3 layer compression although we may need to go to 4. The patient has been in for multiple changes to her wrap since I last saw her a week ago. She says that the area was leaking. I do not have too much more information on what was found 01/19/19 on evaluation today patient was actually being seen for a nurse visit when unfortunately she had the area on her left lateral lower extremity as well as weeping from the right lower extremity that became apparent. Therefore we did end up actually seeing her for a full visit with myself. She is having some pain at this site as well but fortunately nothing too significant at this point. No fevers, chills, nausea, or vomiting noted at this time. 3/18-Patient is back to the clinic with the left leg venous leg ulcer, the ulcer is larger in size, has a surface that is densely adherent with fibrinous tissue, the Hydrofera Blue was used but is densely adherent and there was difficulty in removing it. The right lower extremity was also wrapped for weeping edema. Patient has a new area over the left lateral foot above the malleolus that is small and appears to have no debris with intact surrounding skin. Patient is on increased dose of Lasix also as a means to edema management 3/25; the patient has a nonhealing venous ulcer on the medial left leg and last week developed a smaller area on the lateral left calf. We have been using Hydrofera Blue with a contact layer. 4/1; no major change in these wounds areas. Left medial and more recently left lateral calf. I tried Iodoflex last week to aid in debridement she did not tolerate this. She stated her pain was terrible all week. She took the top layer of the 4 layer compression off. 4/8; the patient actually looks somewhat better in terms of her more prominent left lateral calf wound. There is some healthy looking tissue  here. She is still complaining of a lot of discomfort. 4/15; patient in a lot of pain secondary to sciatica. She is on a prednisone taper prescribed by her primary physician. She has the 2 areas one on the left medial and more recently a smaller area on the left lateral calf. Both of these just above the malleoli 4/22; her back pain is better but she still states she is very uncomfortable and now feels she is intolerant to the [employer]. No real change in the wounds we have been using Sorbact. She has been previously intolerant to Iodoflex. There is not a lot of option about what we can use to debride this wound under compression that she no doubt needs. sHe states Ultram no longer works for her pain 4/29; no major change in the wounds slightly increased depth. Surface on the original medial wound perhaps somewhat improved however the more recent area on the lateral left ankle is 100% covered in very adherent debris we have been using Sorbact. She tolerates 4 layer compression well and her edema control is a lot better. She has not had to come in for a nurse check 5/6; no major change in the condition of the wounds. She did consent to debridement today which was done with some difficulty. Continuing Sorbact. She did not tolerate Iodoflex.  She was in for a check of her compression the day after we wrapped her last week this was adjusted but nothing much was found 5/13; no major change in the condition or area of the wounds. I was able to get a fairly aggressive debridement done on the lateral left leg wound. Even using Sorbact under compression. She came back in on Friday to have the wrap changed. She says she felt uncomfortable on the lateral aspect of her ankle. She has a long history of chronic venous insufficiency including previous ablation surgery on this side. 5/20-Patient returns for wounds on left leg with both wounds covered in slough, with the lateral leg wound larger in size, she has  been in 3 layer compression and felt more comfortable, she describes pain in ankle, in leg and pins and needles in foot, and is about to try Pamelor for this 6/3; wounds on the left lateral and left medial leg. The area medially which is the most recent of the 2 seems to have had the largest increase in dimensions. We have been using Sorbac to try and debride the surface. She has been to see orthopedics SHAWNDA, DOVEL ([id number]) they apparently did a plain x-ray that was indeterminant. Diagnosed her with neuropathy and they have ordered an MRI to determine if there is underlying osteomyelitis. This was not high on my thought list but I suppose it is prudent. We have advised her to make an appointment with vein and vascular in [place]. She has a history of a left greater saphenous and accessory vein ablations I wonder if there is anything else that can be done from a surgical point of view to help in these difficult refractory wounds. We have previously healed this wound on one occasion but it keeps on reopening [medial side] 6/10; deep tissue culture I did last week I think on the left medial wound showed both moderate E. coli and moderate staph aureus [MSSA]. She is going to require antibiotics and I have chosen Augmentin. We have been using Sorbact and we have made better looking wound surface on both sides but certainly no improvement in wound area. She was back in last Friday apparently for a dressing changes the wrap was hurting her outer left ankle. She has not managed to get a hold of vein and vascular in [place]. We are going to have to make her that appointment 6/17; patient is tolerating the Augmentin. She had an MRI that I think was ordered by orthopedic surgeon this did not show osteomyelitis or an abscess did suggest cellulitis. We have been using Sorbact to the lateral and medial ankles. We have been trying to arrange a follow-up appointment with vein and vascular in  [place] or did her original ablations. We apparently an area sent the request to vein and vascular in [facility] 6/24; patient has completed the Augmentin. We do not yet have a vein and vascular appointment in [place]. I am not sure what the issue is here we have asked her to call tomorrow. We are using Sorbact. Making some improvements and especially the medial wound. Both surfaces however look better medial and lateral. 7/1; the patient has been in contact with vein and vascular in [place] but has not yet received an appointment. Using Sorbact we have gradually improve the wound surface with no improvement in surface area. She is approved for Apligraf but the wound surface still is not completely viable. She has not had to come in for a dressing change 7/8;  the patient has an appointment with vein and vascular on 7/31 which is a Friday afternoon. She is concerned about getting back here for [place] to dress her wounds. I think it is important to have them goal for her venous reflux/history of ablations etc. to see if anything else can be done. She apparently tested positive for 1 of the blood tests with regards to lupus and saw a rheumatologist. He has raised the issue of vasculitis again. I have had this thought in the past however the evidence seems overwhelming that this is a venous reflux etiology. If the rheumatologist tells me there is clinical and laboratory investigation is positive for lupus I will rethink this. 7/15; the patient's wound surfaces are quite a bit better. The medial area which was her original wound now has no depth although the lateral wound which was the more recent area actually appears larger. Both with viable surfaces which is indeed better. Using Sorbact. I wanted to use Apligraf on her however there is the issue of the vein and vascular appointment on 7/31 at 2:00 in the afternoon which would not allow her to get back to be rewrapped and they would no doubt  remove the graft 7/22; the patient's wound surfaces have moderate amount of debris although generally look better. The lateral one is larger with 2 small satellite areas superiorly. We are waiting for her vein and vascular appointment on 7/31. She has been approved for Apligraf which I would like to use after th 7/29; wound surfaces have improved no debridement is required we have been using Sorbact. She sees vein and vascular on Friday with this so question of whether anything can be done to lessen the likelihood of recurrence and/or speed the healing of these areas. She is already had previous ablations. She no doubt has severe venous hypertension 8/5-Patient returns at 1 week, she was in [place] for 3 days by her podiatrist, we have been using so backed to the wound, she has increased pain in both the wounds on the left lower leg especially the more distal one on the lateral aspect 8/12-Patient returns at 1 week and she is agreeable to having debridement in both wounds on her left leg today. We have been using Sorbact, and vascular studies were reviewed at last visit 8/19; the patient arrives with her wounds fairly clean and no debridement is required. We have used Sorbact which is really done a nice job in cleaning up these very difficult wound surfaces. The patient saw Dr. Donzetta Matters of vascular surgery on 7/31. He did not feel that there was an arterial component. He felt that her treated greater saphenous vein is adequately addressed and that the small saphenous vein did not appear to be involved significantly. She was also noted to have deep venous reflux which is not treatable. Dr. Donzetta Matters mentioned the possibility of a central obstructive component leading to reflux and he offered her central venography. She wanted to discuss this or think about it. I have urged her to go ahead with this. She has had recurrent difficult wounds in these areas which do heal but after months in the clinic. If there  is anything that can be done to reduce the likelihood of this I think it is worth it. 9/2 she is still working towards getting follow-up with Dr. Donzetta Matters to schedule her CT. Things are quite a bit worse venography. I put Apligraf on 2 weeks ago on both wounds on the medial and lateral part of  her left lower leg. She arrives in clinic today with 3 superficial additional wounds above the area laterally and one below the wound medially. She describes a lot of discomfort. I think these are probably wrapped injuries. Does not look like she has cellulitis. 07/20/2019 on evaluation today patient appears to be doing somewhat poorly in regard to her lower extremity ulcers. She in fact showed signs of erythema in fact we may even be dealing with an infection at this time. Unfortunately I am unsure if this is just infection or if indeed there may be some allergic reaction that occurred as a result of the Apligraf application. With that being said that would be unusual but nonetheless not impossible in this patient is one who is unfortunately allergic to quite a bit. Currently we have been using the Sorbact which seems to do as well as anything for her. I do think we may want to obtain [name], [name] a culture today to see if there is anything showing up there that may need to be addressed. 9/16; noted that last week the wounds look worse in 1 week follow-up of the Apligraf. Using Sorbact as of 2 days ago. She arrives with copious amounts of drainage and new skin breakdown on the back of the left calf. The wounds arm more substantial bilaterally. There is a fair amount of swelling in the left calf no overt DVT there is edema present I think in the left greater than right thigh. She is supposed to go on 9/28 for CT venography. The wounds on the medial and lateral calf are worse and she has new skin breakdown posteriorly at least new for me. This is almost developing into a circumferential wound  area The Apligraf was taken off last week which I agree with things are not going in the right direction a culture was done we do not have that back yet. She is on Augmentin that she started 2 days ago 9/23; dressing was changed by her nurses on Monday. In general there is no improvement in the wound areas although the area looks less angry than last week. She did get Augmentin for MSSA cultured on the 14th. She still appears to have too much swelling in the left leg even with 3 layer compression 9/30; the patient underwent her procedure on 9/28 by Dr. Donzetta Matters at vascular and vein specialist. She was discovered to have the common iliac vein measuring 12.2 mm but at the level of L4-L5 measured 3 mm. After stenting it measured 10 mm. 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 Rouleaux flow in the common femoral and femoral vein was observed much improved after stenting. We are using silver alginate to the wounds on the medial and lateral ankle on the left. 4 layer compression 10/7; the patient had fluid swelling around her knee and 4 layer compression. At the advice of vein and vascular this was reduced to 3 layer which she is tolerating better. We have been using silver alginate under 3 layer compression since last Friday 10/14; arrives with the areas on the left ankle looking a lot better. Inflammation in the area also a lot better. She came in for a nurse check on 10/9 10/21; continued nice improvement. Slight improvements in surface area of both the medial and lateral wounds on the left. A lot of the satellite lesions in the weeping erythema around these from stasis dermatitis is resolved. We have been using silver alginate 10/28; general improvement in  the entire wound areas although not a lot of change in dimensions the wound certainly looks better. There is a lot less in terms of venous inflammation. Continue silver alginate this week however look towards Hydrofera Blue next  week 11/4; very adherent debris on the medial wound left wound is not as bad. We have been using silver alginate. Change to [facility] today 11/11; very adherent debris on both wound areas. She went to vein and vascular last week and follow-up they put in [place] boot on this today. He says the [facility] was adherent. Wound is definitely not as good as last week. Especially on the left there the satellite lesions look more prominent 11/18; absolutely no better. erythema on lateral aspect with tenderness. 09/30/2019 on evaluation today patient appears to actually be doing better. Dr. Dellia Nims did put her on doxycycline last week which I do believe has helped her at this point. Fortunately there is no signs of active infection at this time. No fevers, chills, nausea, vomiting, or diarrhea. I do believe he may want extend the doxycycline for 7 additional days just to ensure everything does completely cleared up the patient is in agreement with that plan. Otherwise she is going require some sharp debridement today 12/2; patient is completing a 2-week course of doxycycline. I gave her this empirically for inflammation as well as infection when I last saw her 2 weeks ago. All of this seems to be better. She is using silver alginate she has the area on the medial aspect of the larger area laterally and the 2 small satellite regions laterally above the major wound. 12/9; the patient's wound on the left medial and left lateral calf look really quite good. We have been using Sorbact. She saw vein and vascular in follow-up on 10/09/2019. She has had a previous left greater saphenous vein ablation by Dr. Oscar La in 2016. More recently she underwent a left common iliac vein stent by Dr. 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 The swelling is improved and certainly the wounds have improved. The patient shows [place] today area on the right lateral calf there is almost no wound but leaking lymphedema.  She says she start this started 3 or 4 days ago. She did not traumatize it. It is not painful. She does not wear compression on that side [name], [name] Objective Constitutional Patient is hypertensive.. Pulse regular and within target range for patient.Marland Kitchen Respirations regular, non-labored and within target range.Marland Kitchen appears in no distress. Vitals Time Taken: 2:25 PM, Height: 63 in, Weight: 224.7 lbs, BMI: 39.8, Temperature: 98.4 F, Pulse: 87 bpm, Respiratory Rate: 16 breaths/min, Blood Pressure: 155/77 mmHg. Eyes Conjunctivae clear. No discharge. Respiratory Respiratory effort is easy and symmetric bilaterally. Rate is normal at rest and on room air.. Cardiovascular Pedal pulses are palpable. Edema control seems fairly adequate bilaterally she has lymphedema with lipodermatosclerosis. Lymphatic None palpable in the popliteal area bilaterally. General Notes: Wound exam The wounds on her left lateral and left medial lower leg look really quite good. The area laterally is smaller. Most no debridement is necessary On the right medial calf she has a weeping area that does not have a clear wound but definitely will need compression. She will need stockings Integumentary (Hair, Skin) Under illumination on the right lateral lower leg there is a weeping area. Even with the light it was difficult to see a wound per se. Wound #5 status is Open. Original cause of wound was Gradually Appeared. The  wound is located on the Left,Medial Lower Leg. The wound measures 2.5cm length x 2.3cm width x 0.2cm depth; 4.516cm^2 area and 0.903cm^3 volume. There is Fat Layer (Subcutaneous Tissue) Exposed exposed. There is no tunneling or undermining noted. There is a large amount of serosanguineous drainage noted. The wound margin is flat and intact. There is medium (34-66%) pale granulation within the wound bed. There is a medium (34-66%) amount of necrotic tissue within the wound bed including Adherent  Slough. Wound #6 status is Open. Original cause of wound was Gradually Appeared. The wound is located on the Left,Lateral Lower Leg. The wound measures 3.5cm length x 3cm width x 0.2cm depth; 8.247cm^2 area and 1.649cm^3 volume. There is Fat Layer (Subcutaneous Tissue) Exposed exposed. There is a large amount of serosanguineous drainage noted. The wound margin is flat and intact. There is medium (34-66%) pink granulation within the wound bed. There is a medium (34-66%) amount of necrotic tissue within the wound bed including Adherent Slough. Wound #9 status is Open. Original cause of wound was Gradually Appeared. The wound is located on the Left,Lateral,Posterior Lower Leg. The wound measures 0.7cm length x 0.5cm width x 0.1cm depth; 0.275cm^2 area and 0.027cm^3 volume. There is Fat Layer (Subcutaneous Tissue) Exposed exposed. There is a large amount of serosanguineous drainage noted. The wound margin is flat and intact. There is medium (34-66%) pink granulation within the wound bed. There is a medium (34-66%) amount of necrotic tissue within the wound bed including Adherent Slough. Assessment [name], [name] Active Problems ICD-10 Non-pressure chronic ulcer of left calf limited to breakdown of skin Chronic venous hypertension (idiopathic) with inflammation of right lower extremity Lymphedema, not elsewhere classified Non-pressure chronic ulcer of other part of right lower leg with other specified severity Plan Wound Cleansing: Wound #5 Left,Medial Lower Leg: Cleanse wound with mild soap and water Wound #6 Left,Lateral Lower Leg: Cleanse wound with mild soap and water Wound #9 Left,Lateral,Posterior Lower Leg: Cleanse wound with mild soap and water Anesthetic (add to Medication List): Wound #5 Left,Medial Lower Leg: Topical Lidocaine 4% cream applied to wound bed prior to debridement (In Clinic Only). Wound #6 Left,Lateral Lower Leg: Topical Lidocaine 4% cream applied to  wound bed prior to debridement (In Clinic Only). Skin Barriers/Peri-Wound Care: Wound #5 Left,Medial Lower Leg: Triamcinolone Acetonide Ointment (TCA) Wound #6 Left,Lateral Lower Leg: Triamcinolone Acetonide Ointment (TCA) Wound #9 Left,Lateral,Posterior Lower Leg: Triamcinolone Acetonide Ointment (TCA) Primary Wound Dressing: Wound #5 Left,Medial Lower Leg: Silver Alginate - adaptic for sticking Wound #6 Left,Lateral Lower Leg: Silver Alginate - adaptic for sticking Wound #9 Left,Lateral,Posterior Lower Leg: Silver Alginate - adaptic for sticking Secondary Dressing: Wound #5 Left,Medial Lower Leg: Drawtex Wound #6 Left,Lateral Lower Leg: Drawtex Wound #9 Left,Lateral,Posterior Lower Leg: Drawtex Dressing Change Frequency: Wound #5 Left,Medial Lower Leg: Change dressing every week Other: - as needed. Wound #6 Left,Lateral Lower Leg: Change dressing every week Other: - as needed. Wound #9 Left,Lateral,Posterior Lower Leg: Change dressing every week Other: - as needed. Follow-up Appointments: SARIYA, RANDOL ([id number]) Wound #5 Left,Medial Lower Leg: Return Appointment in 1 week. Nurse Visit as needed - Call of needed Wound #6 Left,Lateral Lower Leg: Return Appointment in 1 week. Nurse Visit as needed - Call of needed Wound #9 Left,Lateral,Posterior Lower Leg: Return Appointment in 1 week. Nurse Visit as needed - Call of needed Edema Control: Wound #5 Left,Medial Lower Leg: 3 Layer Compression System - Bilateral - unna to anchor Wound #6 Left,Lateral Lower Leg: 3 Layer Compression System -  Bilateral - unna to anchor Wound #9 Left,Lateral,Posterior Lower Leg: 3 Layer Compression System - Bilateral - unna to anchor, Right-Lymphedema drainage 1. Triamcinolone bilaterally 2. Left lower leg we have been using silver alginate on both sides. Using a contact layer of Adaptic 3. I put plain alginate and compression on the right leg because of the weeping area. Hoping to  avoid a similar situation to the left Electronic Signature(s) Signed: 10/14/2019 6:09:55 PM By: Linton Ham MD Entered By: Linton Ham on 10/14/2019 17:08:45 SYDNEE, RIDINGER ([id number]) -------------------------------------------------------------------------------- SuperBill Details Patient Name: STORM, [name]. Date of Service: 10/14/2019 Medical Record Number: [id number] Patient Account Number: [id number] Date of Birth/Sex: 1946-02-11 (74 y.o. F) Treating RN: Cornell Barman Primary Care Provider: [name] Other Clinician: Referring Provider: [name] Treating Provider/Extender: Tito Dine in Treatment: 6 Diagnosis Coding ICD-10 Codes Code Description [phone] Non-pressure chronic ulcer of left calf limited to breakdown of skin I87.321 Chronic venous hypertension (idiopathic) with inflammation of right lower extremity I89.0 Lymphedema, not elsewhere classified Facility Procedures CPT4: Description Modifier Quantity Code [id number] - WOUND CARE VISIT-LEV 2 EST PT 1 CPT4: [id number] BILATERAL: Application of multi-layer venous compression system; leg (below 1 knee), including ankle and foot. Notes new wound on right with different etiology Electronic Signature(s) Signed: 10/14/2019 5:32:17 PM By: [name], [name], RN, CWS, [name], [name] Signed: 10/14/2019 6:09:55 PM By: Linton Ham MD Entered By: [name], [name], RN, CWS, Kim on 10/14/2019 17:32:17

## 2019-10-21 ENCOUNTER — Encounter: Payer: Medicare Other | Admitting: Internal Medicine

## 2019-10-21 ENCOUNTER — Other Ambulatory Visit: Payer: Self-pay

## 2019-10-21 DIAGNOSIS — I890 Lymphedema, not elsewhere classified: Secondary | ICD-10-CM | POA: Diagnosis not present

## 2019-10-21 DIAGNOSIS — L97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d: Secondary | ICD-10-CM | POA: Diagnosis not present

## 2019-10-22 ENCOUNTER — Other Ambulatory Visit: Payer: Self-pay | Admitting: Family Medicine

## 2019-10-22 NOTE — Progress Notes (Signed)
[name] Visit Report for 10/21/2019 Arrival Information Details Patient Name: [name], [name]. Date of Service: 10/21/2019 12:45 PM Medical Record Number: [id number] Patient Account Number: [id number] Date of Birth/Sex: Dec 30, 1945 (73 y.o. F) Treating RN: Cornell Barman Primary Care Gracilyn Gunia: [name] Other Clinician: Referring Neka Bise: [name] Treating Frank Novelo/Extender: Tito Dine in Treatment: 65 Visit Information History Since Last Visit Added or deleted any medications: No Patient Arrived: Ambulatory Any new allergies or adverse reactions: No Arrival Time: 12:52 Had a fall or experienced change in No Accompanied By: self activities of daily living that may affect Transfer Assistance: None risk of falls: Patient Identification Verified: Yes Signs or symptoms of abuse/neglect since last visito No Secondary Verification Process Yes Hospitalized since last visit: No Completed: Implantable device outside of the clinic excluding No Patient Requires Transmission-Based No cellular tissue based products placed in the center Precautions: since last visit: Patient Has Alerts: Yes Has Dressing in Place as Prescribed: Yes Patient Alerts: Patient on Blood Has Compression in Place as Prescribed: Yes Thinner Pain Present Now: No aspirin 81 Electronic Signature(s) Signed: 10/21/2019 3:29:14 PM By: Lorine Bears RCP, RRT, CHT Entered By: Lorine Bears on 10/21/2019 12:53:58 Ledwell, [name] -------------------------------------------------------------------------------- Encounter Discharge Information Details Patient Name: [name]. Date of Service: 10/21/2019 12:45 PM Medical Record Number: [id number] Patient Account Number: [id number] Date of Birth/Sex: [birth year] (74 y.o. F) Treating RN: Cornell Barman Primary Care Jaylanni Eltringham: [name] Other Clinician: Referring Nilesh Stegall:  [name] Treating Carlosdaniel Grob/Extender: Tito Dine in Treatment: 43 Encounter Discharge Information Items Post Procedure Vitals Discharge Condition: Stable Temperature (F): 98.3 Ambulatory Status: Ambulatory Pulse (bpm): 88 Discharge Destination: Home Respiratory Rate (breaths/min): 16 Transportation: Private Auto Blood Pressure (mmHg): 155/68 Accompanied By: self Schedule Follow-up Appointment: Yes Clinical Summary of Care: Electronic Signature(s) Signed: 10/22/2019 11:42:17 AM By: [name], [name], RN, CWS, [name], [name] Entered By: [name], [name], RN, CWS, Kim on 10/21/2019 13:21:12 [name] -------------------------------------------------------------------------------- Lower Extremity Assessment Details Patient Name: [name]. Date of Service: 10/21/2019 12:45 PM Medical Record Number: [id number] Patient Account Number: [id number] Date of Birth/Sex: 02/03/1946 (73 y.o. F) Treating RN: Montey Hora Primary Care Linh Hedberg: [name] Other Clinician: Referring Woodard Perrell: [name] Treating Mishayla Sliwinski/Extender: Tito Dine in Treatment: 45 Edema Assessment Assessed: [Left: No] [Right: No] Edema: [Left: Ye] [Right: s] Calf Left: Right: Point of Measurement: 33 cm From Medial Instep 40 cm cm Ankle Left: Right: Point of Measurement: 10 cm From Medial Instep 23.5 cm cm Vascular Assessment Pulses: Dorsalis Pedis Palpable: [Left:Yes] Electronic Signature(s) Signed: 10/21/2019 4:12:46 PM By: Montey Hora Entered By: Montey Hora on 10/21/2019 12:59:24 Masters, [name] -------------------------------------------------------------------------------- Multi Wound Chart Details Patient Name: [name]. Date of Service: 10/21/2019 12:45 PM Medical Record Number: [id number] Patient Account Number: [id number] Date of Birth/Sex: 03/30/1946 (73 y.o. F) Treating RN: Cornell Barman Primary Care Fishel Wamble:  [name] Other Clinician: Referring Savien Mamula: [name] Treating Murdis Flitton/Extender: Tito Dine in Treatment: 64 Vital Signs Height(in): 63 Pulse(bpm): 32 Weight(lbs): 224.7 Blood Pressure(mmHg): 155/68 Body Mass Index(BMI): 40 Temperature(F): 98.5 Respiratory Rate 16 (breaths/min): Photos: Wound Location: Left Lower Leg - Medial Left Lower Leg - Lateral Left Lower Leg - Lateral, Posterior Wounding Event: Gradually Appeared Gradually Appeared Gradually Appeared Primary Etiology: Lymphedema Venous Leg Ulcer Venous Leg Ulcer Comorbid History: Cataracts, Asthma, Sleep Cataracts, Asthma, Sleep Cataracts, Asthma, Sleep Apnea, Deep Vein Apnea, Deep Vein Apnea, Deep Vein Thrombosis, Hypertension, Thrombosis, Hypertension, Thrombosis,  Hypertension, Peripheral Venous Disease, Peripheral Venous Disease, Peripheral Venous Disease, Osteoarthritis, Received Osteoarthritis, Received Osteoarthritis, Received Chemotherapy, Received Chemotherapy, Received Chemotherapy, Received Radiation Radiation Radiation Date Acquired: 11/19/2018 01/19/2019 07/08/2019 Weeks of Treatment: 45 39 15 Wound Status: Open Open Open Measurements L x W x D 2.5x2.3x0.2 2.8x2.5x0.2 0.6x0.6x0.1 (cm) Area (cm) : 4.516 5.498 0.283 Volume (cm) : 0.903 1.1 0.028 % Reduction in Area: 47.20% -2399.10% 49.90% % Reduction in Volume: -5.60% -4900.00% 50.90% Classification: Full Thickness Without Full Thickness Without Full Thickness Without Exposed Support Structures Exposed Support Structures Exposed Support Structures Exudate Amount: Large Large Large Exudate Type: Serosanguineous Serosanguineous Serosanguineous Exudate Color: red, brown red, brown red, brown Wound Margin: Flat and Intact Flat and Intact Flat and Intact Granulation Amount: Medium (34-66%) Medium (34-66%) Large (67-100%) Granulation Quality: Pale Pink Pink Necrotic Amount: Medium (34-66%) Medium (34-66%) Small (1-33%) Morell,  Kobi J. ([id number]) Exposed Structures: Fat Layer (Subcutaneous Fat Layer (Subcutaneous Fat Layer (Subcutaneous Tissue) Exposed: Yes Tissue) Exposed: Yes Tissue) Exposed: Yes Fascia: No Fascia: No Fascia: No Tendon: No Tendon: No Tendon: No Muscle: No Muscle: No Muscle: No Joint: No Joint: No Joint: No Bone: No Bone: No Bone: No Epithelialization: None None None Debridement: Debridement - Excisional N/A N/A Pre-procedure 13:18 N/A N/A Verification/Time Out Taken: Pain Control: Lidocaine N/A N/A Tissue Debrided: Subcutaneous, Slough N/A N/A Level: Skin/Subcutaneous Tissue N/A N/A Debridement Area (sq cm): 5.75 N/A N/A Instrument: Curette N/A N/A Bleeding: Moderate N/A N/A Hemostasis Achieved: Pressure N/A N/A Debridement Treatment Procedure was tolerated well N/A N/A Response: Post Debridement 2.5x2.3x0.2 N/A N/A Measurements L x W x D (cm) Post Debridement Volume: 0.903 N/A N/A (cm) Procedures Performed: Debridement N/A N/A Treatment Notes Wound #5 (Left, Medial Lower Leg) Notes TCA, Adaptic, Silver alginate, 3 layer, unna to anchor Wound #6 (Left, Lateral Lower Leg) Notes TCA, Adaptic, Silver alginate, 3 layer, unna to anchor Wound #9 (Left, Lateral, Posterior Lower Leg) Notes TCA, Adaptic, Silver alginate, 3 layer, unna to anchor Electronic Signature(s) Signed: 10/21/2019 4:44:44 PM By: Linton Ham MD Entered By: Linton Ham on 10/21/2019 13:44:51 Steuart, [name] -------------------------------------------------------------------------------- Multi-Disciplinary Care Plan Details Patient Name: JAGGER, WOTEN. Date of Service: 10/21/2019 12:45 PM Medical Record Number: [id number] Patient Account Number: [id number] Date of Birth/Sex: 09/10/1946 (73 y.o. F) Treating RN: Cornell Barman Primary Care Biannca Scantlin: [name] Other Clinician: Referring Jahkai Yandell: [name] Treating Delmer Kowalski/Extender: Tito Dine in  Treatment: 29 Active Inactive Orientation to the Wound Care Program Nursing Diagnoses: Knowledge deficit related to the wound healing center program Goals: Patient/caregiver will verbalize understanding of the [facility] Date Initiated: 12/10/2018 Target Resolution Date: 01/09/2019 Goal Status: Active Interventions: Provide education on orientation to the wound center Notes: Soft Tissue Infection Nursing Diagnoses: Impaired tissue integrity Goals: Patient's soft tissue infection will resolve Date Initiated: 12/10/2018 Target Resolution Date: 01/09/2019 Goal Status: Active Interventions: Assess signs and symptoms of infection every visit Notes: Venous Leg Ulcer Nursing Diagnoses: Actual venous Insuffiency (use after diagnosis is confirmed) Goals: Patient will maintain optimal edema control Date Initiated: 12/10/2018 Target Resolution Date: 01/09/2019 Goal Status: Active Interventions: Assess peripheral edema status every visit. AJ, STELLWAGEN ([id number]) Treatment Activities: Therapeutic compression applied : 12/10/2018 Notes: Wound/Skin Impairment Nursing Diagnoses: Impaired tissue integrity Goals: Patient/caregiver will verbalize understanding of skin care regimen Date Initiated: 12/10/2018 Target Resolution Date: 01/09/2019 Goal Status: Active Interventions: Assess ulceration(s) every visit Treatment Activities: Topical wound management initiated : 12/10/2018 Notes: Electronic Signature(s) Signed: 10/22/2019 11:42:17 AM By: [name], [name], RN, CWS,  Maudie Mercury RN, [name] Entered By: [name], [name], RN, CWS, Kim on 10/21/2019 13:14:29 Kolbe, Hoxworth [name] -------------------------------------------------------------------------------- Pain Assessment Details Patient Name: WILHELMENIA, SORBO. Date of Service: 10/21/2019 12:45 PM Medical Record Number: [id number] Patient Account Number: [id number] Date of Birth/Sex: 27-Oct-1946 (73 y.o. F) Treating RN: Cornell Barman Primary  Care Daisean Brodhead: [name] Other Clinician: Referring Liston Thum: [name] Treating Tinsley Lomas/Extender: Tito Dine in Treatment: 45 Active Problems Location of Pain Severity and Description of Pain Patient Has Paino No Site Locations Pain Management and Medication Current Pain Management: Electronic Signature(s) Signed: 10/21/2019 3:29:14 PM By: Lorine Bears RCP, RRT, CHT Signed: 10/22/2019 11:42:17 AM By: [name], [name], RN, CWS, [name], [name] Entered By: Lorine Bears on 10/21/2019 12:54:44 [name] -------------------------------------------------------------------------------- Patient/Caregiver Education Details Patient Name: AMBERLI, SALT. Date of Service: 10/21/2019 12:45 PM Medical Record Number: [id number] Patient Account Number: [id number] Date of Birth/Gender: August 07, 1946 (73 y.o. F) Treating RN: Cornell Barman Primary Care Physician: [name] Other Clinician: Referring Physician: [name] Treating Physician/Extender: Tito Dine in Treatment: 19 Education Assessment Education Provided To: Patient Education Topics Provided Wound/Skin Impairment: Handouts: Caring for Your Ulcer Methods: Demonstration, Explain/Verbal Responses: State content correctly Electronic Signature(s) Signed: 10/22/2019 11:42:17 AM By: [name], [name], RN, CWS, [name], [name] Entered By: [name], [name], RN, CWS, Kim on 10/21/2019 13:20:00 DANYELL, KROLAK ([id number]) -------------------------------------------------------------------------------- Wound Assessment Details Patient Name: EMANUELA, TACKMAN. Date of Service: 10/21/2019 12:45 PM Medical Record Number: [id number] Patient Account Number: [id number] Date of Birth/Sex: [name] 17, 1946 (73 y.o. F) Treating RN: Montey Hora Primary Care Shundra Wirsing: [name] Other Clinician: Referring Suttyn Cryder: [name] Treating Joesph Marcy/Extender: Tito Dine in Treatment: 45 Wound Status Wound Number: 5 Primary Lymphedema Etiology: Wound Location: Left Lower Leg - Medial Wound Open Wounding Event: Gradually Appeared Status: Date Acquired: 11/19/2018 Comorbid Cataracts, Asthma, Sleep Apnea, Deep Vein Weeks Of Treatment: 45 History: Thrombosis, Hypertension, Peripheral Venous Clustered Wound: No Disease, Osteoarthritis, Received Chemotherapy, Received Radiation Photos Wound Measurements Length: (cm) 2.5 Width: (cm) 2.3 Depth: (cm) 0.2 Area: (cm) 4.516 Volume: (cm) 0.903 % Reduction in Area: 47.2% % Reduction in Volume: -5.6% Epithelialization: None Tunneling: No Undermining: No Wound Description Full Thickness Without Exposed Support Classification: Structures Wound Margin: Flat and Intact Exudate Large Amount: Exudate Type: Serosanguineous Exudate Color: red, brown Foul Odor After Cleansing: No Slough/Fibrino Yes Wound Bed Granulation Amount: Medium (34-66%) Exposed Structure Granulation Quality: Pale Fascia Exposed: No Necrotic Amount: Medium (34-66%) Fat Layer (Subcutaneous Tissue) Exposed: Yes Necrotic Quality: Adherent Slough Tendon Exposed: No Muscle Exposed: No Joint Exposed: No Bone Exposed: No Shifflett, Ileigh J. ([id number]) Treatment Notes Wound #5 (Left, Medial Lower Leg) Notes TCA, Adaptic, Silver alginate, 3 layer, unna to anchor Electronic Signature(s) Signed: 10/21/2019 4:12:46 PM By: Montey Hora Entered By: Montey Hora on 10/21/2019 13:08:31 Charon, [name] -------------------------------------------------------------------------------- Wound Assessment Details Patient Name: JULLISA, NAATZ. Date of Service: 10/21/2019 12:45 PM Medical Record Number: [id number] Patient Account Number: [id number] Date of Birth/Sex: 1946/01/08 (73 y.o. F) Treating RN: Montey Hora Primary Care Cassey Bacigalupo: [name] Other Clinician: Referring Roth Ress: [name] Treating  Dominga Mcduffie/Extender: Tito Dine in Treatment: 45 Wound Status Wound Number: 6 Primary Venous Leg Ulcer Etiology: Wound Location: Left Lower Leg - Lateral Wound Open Wounding Event: Gradually Appeared Status: Date Acquired: 01/19/2019 Comorbid Cataracts, Asthma, Sleep Apnea, Deep Vein Weeks Of Treatment: 39 History: Thrombosis, Hypertension, Peripheral Venous Clustered Wound: No Disease, Osteoarthritis, Received Chemotherapy, Received Radiation Photos Wound Measurements Length: (  cm) 2.8 Width: (cm) 2.5 Depth: (cm) 0.2 Area: (cm) 5.498 Volume: (cm) 1.1 % Reduction in Area: -2399.1% % Reduction in Volume: -4900% Epithelialization: None Tunneling: No Undermining: No Wound Description Full Thickness Without Exposed Support Classification: Structures Wound Margin: Flat and Intact Exudate Large Amount: Exudate Type: Serosanguineous Exudate Color: red, brown Foul Odor After Cleansing: No Slough/Fibrino Yes Wound Bed Granulation Amount: Medium (34-66%) Exposed Structure Granulation Quality: Pink Fascia Exposed: No Necrotic Amount: Medium (34-66%) Fat Layer (Subcutaneous Tissue) Exposed: Yes Necrotic Quality: Adherent Slough Tendon Exposed: No Muscle Exposed: No Joint Exposed: No Bone Exposed: No Holquin, Halea J. ([id number]) Treatment Notes Wound #6 (Left, Lateral Lower Leg) Notes TCA, Adaptic, Silver alginate, 3 layer, unna to anchor Electronic Signature(s) Signed: 10/21/2019 4:12:46 PM By: Montey Hora Entered By: Montey Hora on 10/21/2019 13:08:58 Charlot, [name] ([id number]) -------------------------------------------------------------------------------- Wound Assessment Details Patient Name: FARRAN, SANKER. Date of Service: 10/21/2019 12:45 PM Medical Record Number: [id number] Patient Account Number: [id number] Date of Birth/Sex: [name] 09, 1946 (73 y.o. F) Treating RN: Montey Hora Primary Care Ata Pecha: [name] Other  Clinician: Referring Ronin Crager: [name] Treating Jabes Primo/Extender: Tito Dine in Treatment: 45 Wound Status Wound Number: 9 Primary Venous Leg Ulcer Etiology: Wound Location: Left Lower Leg - Lateral, Posterior Wound Open Wounding Event: Gradually Appeared Status: Date Acquired: 07/08/2019 Comorbid Cataracts, Asthma, Sleep Apnea, Deep Vein Weeks Of Treatment: 15 History: Thrombosis, Hypertension, Peripheral Venous Clustered Wound: No Disease, Osteoarthritis, Received Chemotherapy, Received Radiation Photos Wound Measurements Length: (cm) 0.6 Width: (cm) 0.6 Depth: (cm) 0.1 Area: (cm) 0.283 Volume: (cm) 0.028 % Reduction in Area: 49.9% % Reduction in Volume: 50.9% Epithelialization: None Tunneling: No Undermining: No Wound Description Full Thickness Without Exposed Support Classification: Structures Wound Margin: Flat and Intact Exudate Large Amount: Exudate Type: Serosanguineous Exudate Color: red, brown Foul Odor After Cleansing: No Slough/Fibrino Yes Wound Bed Granulation Amount: Large (67-100%) Exposed Structure Granulation Quality: Pink Fascia Exposed: No Necrotic Amount: Small (1-33%) Fat Layer (Subcutaneous Tissue) Exposed: Yes Necrotic Quality: Adherent Slough Tendon Exposed: No Muscle Exposed: No Joint Exposed: No Bone Exposed: No Aoun, Tija J. ([id number]) Treatment Notes Wound #9 (Left, Lateral, Posterior Lower Leg) Notes TCA, Adaptic, Silver alginate, 3 layer, unna to anchor Electronic Signature(s) Signed: 10/21/2019 4:12:46 PM By: Montey Hora Entered By: Montey Hora on 10/21/2019 13:09:24 [name] ([id number]) -------------------------------------------------------------------------------- Vitals Details Patient Name: CASSEE, THORNES. Date of Service: 10/21/2019 12:45 PM Medical Record Number: [id number] Patient Account Number: [id number] Date of Birth/Sex: Nov 16, 1945 (73 y.o. F) Treating RN: Cornell Barman Primary Care Edwyn Inclan: [name] Other Clinician: Referring Gila Lauf: [name] Treating Keisuke Hollabaugh/Extender: Tito Dine in Treatment: 45 Vital Signs Time Taken: 12:53 Temperature (F): 98.5 Height (in): 63 Pulse (bpm): 88 Weight (lbs): 224.7 Respiratory Rate (breaths/min): 16 Body Mass Index (BMI): 39.8 Blood Pressure (mmHg): 155/68 Reference Range: 80 - 120 mg / dl Electronic Signature(s) Signed: 10/21/2019 3:29:14 PM By: Lorine Bears RCP, RRT, CHT Entered By: Lorine Bears on 10/21/2019 12:56:00

## 2019-10-22 NOTE — Progress Notes (Signed)
[name], [name] Visit Report for 10/21/2019 Debridement Details Patient Name: [name], [name]. Date of Service: 10/21/2019 12:45 PM Medical Record Number: [id number] Patient Account Number: [id number] Date of Birth/Sex: 09-Feb-1946 (73 y.o. F) Treating RN: Cornell Barman Primary Care Provider: [name] Other Clinician: Referring Provider: [name] Treating Provider/Extender: Tito Dine in Treatment: 45 Debridement Performed for Wound #5 Left,Medial Lower Leg Assessment: Performed By: Physician [name] Dillon, MD Debridement Type: Debridement Level of Consciousness (Pre- Awake and Alert procedure): Pre-procedure Verification/Time Yes - 13:18 Out Taken: Start Time: 13:18 Pain Control: Lidocaine Total Area Debrided (L x W): 2.5 (cm) x 2.3 (cm) = 5.75 (cm) Tissue and other material Viable, Non-Viable, Slough, Subcutaneous, Slough debrided: Level: Skin/Subcutaneous Tissue Debridement Description: Excisional Instrument: Curette Bleeding: Moderate Hemostasis Achieved: Pressure End Time: 13:20 Response to Treatment: Procedure was tolerated well Level of Consciousness Awake and Alert (Post-procedure): Post Debridement Measurements of Total Wound Length: (cm) 2.5 Width: (cm) 2.3 Depth: (cm) 0.2 Volume: (cm) 0.903 Character of Wound/Ulcer Post Debridement: Stable Post Procedure Diagnosis Same as Pre-procedure Electronic Signature(s) Signed: 10/21/2019 4:44:44 PM By: Linton Ham MD Signed: 10/22/2019 11:42:17 AM By: [name], [name], RN, CWS, [name], [name] Entered By: Linton Ham on 10/21/2019 13:45:02 [name] -------------------------------------------------------------------------------- HPI Details Patient Name: [name], [name]. Date of Service: 10/21/2019 12:45 PM Medical Record Number: [id number] Patient Account Number: [id number] Date of Birth/Sex: 10-Aug-1946 (73 y.o. F) Treating RN: Cornell Barman Primary  Care Provider: [name] Other Clinician: Referring Provider: [name] Treating Provider/Extender: Tito Dine in Treatment: 24 History of Present Illness HPI Description: Pleasant 73 year old with history of chronic venous insufficiency. No diabetes or peripheral vascular disease. Left ABI 1.29. Questionable history of left lower extremity DVT. She developed a recurrent ulceration on her left lateral calf in December 2015, which she attributes to poor diet and subsequent lower extremity edema. She underwent endovenous laser ablation of her left greater saphenous vein in 2010. She underwent laser ablation of accessory branch of left GSV in April 2016 by Dr. Kellie Simmering at [facility]. She was previously wearing Unna boots, which she tolerated well. Tolerating 2 layer compression and cadexomer iodine. She returns to clinic for follow-up and is without new complaints. She denies any significant pain at this time. She reports persistent pain with pressure. No claudication or ischemic rest pain. No fever or chills. No drainage. READMISSION 11/13/16; this is a 73 year old woman who is not a diabetic. She is here for a review of a painful area on her left medial lower extremity. I note that she was seen here previously last year for wound I believe to be in the same area. At that time she had undergone previously a left greater saphenous vein ablation by Dr. Kellie Simmering and she had a ablation of the anterior accessory branch of the left greater saphenous vein in March 2016. Seeing that the wound actually closed over. In reviewing the history with her today the ulcer in this area has been recurrent. She describes a biopsy of this area in 2009 that only showed stasis physiology. 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 Do not see that she has ever had arterial studies in the left leg. She tells me she has a follow-up with  Dr. Kellie Simmering in roughly 10 days In any case she developed the reopening of this area roughly a month ago. On the background of this she describes rapidly increasing edema which has responded to Lasix 40 mg and metolazone 2.5 mg as well as the patient's lymph massage. She has been told she has both venous insufficiency and lymphedema but she cannot tolerate compression stockings 11/28/16; the patient saw Dr. Kellie Simmering recently. Per the patient he did arterial Dopplers in the office that did not show evidence of arterial insufficiency, per the patient he stated "treat this like an ordinary venous ulcer". She also saw her dermatologist Dr. Ronnald Ramp who felt that this was more of a vascular ulcer. In general things are improving although she arrives today with increasing bilateral lower extremity edema with weeping a deeper fluid through the wound on the left medial leg compatible with some degree of lymphedema 12/04/16; the patient's wound is fully epithelialized but I don't think fully healed. We will do another week of depression with Promogran and TCA however I suspect we'll be able to discharge her next week. This is a very unusual-looking wound which was initially a figure-of-eight type wound lying on its side surrounded by petechial like hemorrhage. She has had venous ablation on this side. She apparently does not have an arterial issue per Dr. Kellie Simmering. She saw her dermatologist thought it was "vascular". Patient is definitely going to need ongoing compression and I talked about this with her today she will go to elastic therapy after she leaves here next week 12/11/16; the patient's wound is not completely closed today. She has surrounding scar tissue and in further discussion with the patient it would appear that she had ulcers in this area in 2009  for a prolonged period of time ultimately requiring a punch biopsy of this area that only showed venous insufficiency. I did not previously pickup on this part of the history from the patient. 12/18/16; the patient's wound is completely epithelialized. There is no open area here. She has significant bilateral venous insufficiency with secondary lymphedema to a mild-to-moderate degree she does not have compression stockings.. She did not say anything to me when I was in the room, she told our intake nurse that she was still having pain in this area. This isn't unusual recurrent small open area. She is going to go to elastic therapy to obtain compression stockings. 12/25/16; the patient's wound is fully epithelialized. There is no open area here. The patient describes some continued episodic discomfort in this area medial left calf. However everything looks fine and healed here. She is been to elastic therapy and caught herself 15-20 mmHg stockings, they apparently were having trouble getting 20-30 mm stockings in her size [name], [name] 01/22/17; this is a patient we discharged from the clinic a month ago. She has a recurrent open wound on her medial left calf. She had 15 mm support stockings. I told her I thought she needed 20-30 mm compression stockings. She tells me that she has been ill with hospitalization secondary to asthma and is been found to have severe hypokalemia likely secondary to a combination of Lasix and metolazone. This morning she noted blistering and leaking fluid on the posterior part of her left leg. She called our intake nurse urgently and we was saw her this afternoon. She has not had any real discomfort here. I don't know that she's been wearing any stockings on this leg for at least 2-3 days. ABIs in this clinic  were 1.21 on the right and 1.3 on the left. She is previously seen vascular surgery who does not think that there is a peripheral arterial  issue. 01/30/17; Patient arrives with no open wound on the left leg. She has been to elastic therapy and obtained 20-9mmhg below knee stockings and she has one on the right leg today. READMISSION 02/19/18; this Pigg is a now 73 year old patient we've had in this clinic perhaps 3 times before. I had last looked at her from January 07 December 2016 with an area on the medial left leg. We discharged her on 12/25/16 however she had to be readmitted on 01/22/17 with a recurrence. I have in my notes that we discharged her on 20-30 mm stockings although she tells me she was only wearing support hose because she cannot get stockings on predominantly related to her cervical spine surgery/issues. She has had previous ablations done by vein and vascular in [place] including a great saphenous vein ablation on the left with an anterior accessory branch ablation I think both of these were in 2016. On one of the previous visit she had a biopsy noted 2009 that was negative. She is not felt to have an arterial issue. She is not a diabetic. She does have a history of obstructive sleep apnea hypertension asthma as well as chronic venous insufficiency and lymphedema. On this occasion she noted 2 dry scaly patch on her left leg. She tried to put lotion on this it didn't really help. There were 2 open areas.the patient has been seeing her primary physician from 02/05/18 through 02/14/18. She had Unna boots applied. The superior wound now on the lateral left leg has closed but she's had one wound that remains open on the lateral left leg. This is not the same spot as we dealt with in 2018. ABIs in this clinic were 1.3 bilaterally 02/26/18; patient has a small wound on the left lateral calf. Dimensions are down. She has chronic venous insufficiency and lymphedema. 03/05/18; small open area on the left lateral calf. Dimensions are down. Tightly adherent necrotic debris over the surface of the wound which was difficult to  remove. Also the dressing [over collagen] stuck to the wound surface. This was removed with some difficulty as well. Change the primary dressing to Hydrofera Blue ready 03/12/18; small open area on the left lateral calf. Comes in with tightly adherent surface eschar as well as some adherent Hydrofera Blue. 03/19/18; open area on the left lateral calf. Again adherent surface eschar as well as some adherent Hydrofera Blue nonviable subcutaneous tissue. She complained of pain all week even with the reduction from 4-3 layer compression I put on last week. Also she had an increase in her ankle and calf measurements probably related to the same thing. 03/26/18; open area on the left lateral calf. A very small open area remains here. We used silver alginate starting last week as the Hydrofera Blue seem to stick to the wound bed. In using 4-layer compression 04/02/18; the open area in the left lateral calf at some adherent slough which I removed there is no open area here. We are able to transition her into her own compression stocking. Truthfully I think this is probably his support hose. However this does not maintain skin integrity will be limited. She cannot put over the toe compression stockings on because of neck problems hand problems etc. She is allergic to the lining layer of juxta lites. We might be forced to use extremitease stocking should  this fail READMIT 11/24/2018 Patient is now a 73 year old woman who is not a diabetic. She has been in this clinic on at least 3 previous occasions largely with recurrent wounds on her left leg secondary to chronic venous insufficiency with secondary lymphedema. Her situation is complicated by inability to get stockings on and an allergy to neoprene which is apparently a component and at least juxta lites and other stockings. As a result she really has not been wearing any stockings on her legs. She tells [place] that roughly 2 or 3 weeks ago she started noticing a  stinging sensation just above her ankle on the left medial aspect. She has been diagnosed with pseudogout and she wondered whether this was what she was experiencing. She tried to dress this with something she bought at the store however subsequently it pulled skin off and now she has an open wound that is not improving. She has been using Vaseline gauze with a cover bandage. She saw her primary doctor last week who put an Haematologist on her. ABIs in this clinic was 1.03 on the left [name], [name]. ([id number]) 2/12; the area is on the left medial ankle. Odd-looking wound with what looks to be surface epithelialization but a multitude of small petechial openings. This clearly not closed yet. We have been using silver alginate under 3 layer compression with TCA 2/19; the wound area did not look quite as good this week. Necrotic debris over the majority of the wound surface which required debridement. She continues to have a multitude of what looked to be small petechial openings. She reminds [place] that she had a biopsy on this initially during her first outbreak in 2015 in [place] [facility]. She expresses concern about this being a possible melanoma. 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 I assured her that this does not look anything like melanoma. Except for the petechial reaction it does look like a venous insufficiency area and she certainly has evidence of this on both sides 2/26; a difficult area on the left medial ankle. The patient clearly has chronic venous hypertension with some degree of lymphedema. The odd thing about the area is the small petechial hemorrhages. I am not really sure how to explain this. This was present last time and this is not a compression injury. We have been using Hydrofera Blue which I changed to last week 3/4; still using Hydrofera Blue. Aggressive debridement today. She  does not have known arterial issues. She has seen Dr. Kellie Simmering at [facility] vein and vascular and and has an ablation on the left. [Anterior accessory branch of the greater saphenous]. From what I remember they did not feel she had an arterial issue. The patient has had this area biopsied in 2009 at [facility] dermatology and by her recollection they said this was "stasis". She is also follow-up with dermatology locally who thought that this was more of a vascular issue 3/11; using Hydrofera Blue. Aggressive debridement today. She does not have an arterial issue. We are using 3 layer compression although we may need to go to 4. The patient has been in for multiple changes to her wrap since I last saw her a week ago. She says that the area was leaking. I do not have too much more information on what was found 01/19/19 on evaluation today patient was actually being seen for a nurse visit when unfortunately she had the area on  her left lateral lower extremity as well as weeping from the right lower extremity that became apparent. Therefore we did end up actually seeing her for a full visit with myself. She is having some pain at this site as well but fortunately nothing too significant at this point. No fevers, chills, nausea, or vomiting noted at this time. 3/18-Patient is back to the clinic with the left leg venous leg ulcer, the ulcer is larger in size, has a surface that is densely adherent with fibrinous tissue, the Hydrofera Blue was used but is densely adherent and there was difficulty in removing it. The right lower extremity was also wrapped for weeping edema. Patient has a new area over the left lateral foot above the malleolus that is small and appears to have no debris with intact surrounding skin. Patient is on increased dose of Lasix also as a means to edema management 3/25; the patient has a nonhealing venous ulcer on the medial left leg and last week developed a smaller area on the  lateral left calf. We have been using Hydrofera Blue with a contact layer. 4/1; no major change in these wounds areas. Left medial and more recently left lateral calf. I tried Iodoflex last week to aid in debridement she did not tolerate this. She stated her pain was terrible all week. She took the top layer of the 4 layer compression off. 4/8; the patient actually looks somewhat better in terms of her more prominent left lateral calf wound. There is some healthy looking tissue here. She is still complaining of a lot of discomfort. 4/15; patient in a lot of pain secondary to sciatica. She is on a prednisone taper prescribed by her primary physician. She has the 2 areas one on the left medial and more recently a smaller area on the left lateral calf. Both of these just above the malleoli 4/22; her back pain is better but she still states she is very uncomfortable and now feels she is intolerant to the [employer]. No real change in the wounds we have been using Sorbact. She has been previously intolerant to Iodoflex. There is not a lot of option about what we can use to debride this wound under compression that she no doubt needs. sHe states Ultram no longer works for her pain 4/29; no major change in the wounds slightly increased depth. Surface on the original medial wound perhaps somewhat improved however the more recent area on the lateral left ankle is 100% covered in very adherent debris we have been using Sorbact. She tolerates 4 layer compression well and her edema control is a lot better. She has not had to come in for a nurse check 5/6; no major change in the condition of the wounds. She did consent to debridement today which was done with some difficulty. Continuing Sorbact. She did not tolerate Iodoflex. She was in for a check of her compression the day after we wrapped her last week this was adjusted but nothing much was found 5/13; no major change in the condition or area of the  wounds. I was able to get a fairly aggressive debridement done on the lateral left leg wound. Even using Sorbact under compression. She came back in on Friday to have the wrap changed. She says she felt uncomfortable on the lateral aspect of her ankle. She has a long history of chronic venous insufficiency including previous ablation surgery on this side. 5/20-Patient returns for wounds on left leg with both wounds covered in  slough, with the lateral leg wound larger in size, she has been in 3 layer compression and felt more comfortable, she describes pain in ankle, in leg and pins and needles in foot, and is about to try Pamelor for this 6/3; wounds on the left lateral and left medial leg. The area medially which is the most recent of the 2 seems to have had the largest increase in dimensions. We have been using Sorbac to try and debride the surface. She has been to see orthopedics they apparently did a plain x-ray that was indeterminant. Diagnosed her with neuropathy and they have ordered an MRI to [name], [name]. ([id number]) determine if there is underlying osteomyelitis. This was not high on my thought list but I suppose it is prudent. We have advised her to make an appointment with vein and vascular in [place]. She has a history of a left greater saphenous and accessory vein ablations I wonder if there is anything else that can be done from a surgical point of view to help in these difficult refractory wounds. We have previously healed this wound on one occasion but it keeps on reopening [medial side] 6/10; deep tissue culture I did last week I think on the left medial wound showed both moderate E. coli and moderate staph aureus [MSSA]. She is going to require antibiotics and I have chosen Augmentin. We have been using Sorbact and we have made better looking wound surface on both sides but certainly no improvement in wound area. She was back in last Friday apparently for a dressing changes  the wrap was hurting her outer left ankle. She has not managed to get a hold of vein and vascular in [place]. We are going to have to make her that appointment 6/17; patient is tolerating the Augmentin. She had an MRI that I think was ordered by orthopedic surgeon this did not show osteomyelitis or an abscess did suggest cellulitis. We have been using Sorbact to the lateral and medial ankles. We have been trying to arrange a follow-up appointment with vein and vascular in [name] or did her original ablations. We apparently an area sent the request to vein and vascular in [facility] 6/24; patient has completed the Augmentin. We do not yet have a vein and vascular appointment in [place]. I am not sure what the issue is here we have asked her to call tomorrow. We are using Sorbact. Making some improvements and especially the medial wound. Both surfaces however look better medial and lateral. 7/1; the patient has been in contact with vein and vascular in [place] but has not yet received an appointment. Using Sorbact we have gradually improve the wound surface with no improvement in surface area. She is approved for Apligraf but the wound surface still is not completely viable. She has not had to come in for a dressing change 7/8; the patient has an appointment with vein and vascular on 7/31 which is a Friday afternoon. She is concerned about getting back here for [place] to dress her wounds. I think it is important to have them goal for her venous reflux/history of ablations etc. to see if anything else can be done. She apparently tested positive for 1 of the blood tests with regards to lupus and saw a rheumatologist. He has raised the issue of vasculitis again. I have had this thought in the past however the evidence seems overwhelming that this is a venous reflux etiology. If the rheumatologist tells me there is clinical and laboratory investigation  is positive for lupus I will rethink  this. 7/15; the patient's wound surfaces are quite a bit better. The medial area which was her original wound now has no depth although the lateral wound which was the more recent area actually appears larger. Both with viable surfaces which is indeed better. Using Sorbact. I wanted to use Apligraf on her however there is the issue of the vein and vascular appointment on 7/31 at 2:00 in the afternoon which would not allow her to get back to be rewrapped and they would no doubt remove the graft 7/22; the patient's wound surfaces have moderate amount of debris although generally look better. The lateral one is larger with 2 small satellite areas superiorly. We are waiting for her vein and vascular appointment on 7/31. She has been approved for Apligraf which I would like to use after th 7/29; wound surfaces have improved no debridement is required we have been using Sorbact. She sees vein and vascular on Friday with this so question of whether anything can be done to lessen the likelihood of recurrence and/or speed the healing of these areas. She is already had previous ablations. She no doubt has severe venous hypertension 8/5-Patient returns at 1 week, she was in [place] for 3 days by her podiatrist, we have been using so backed to the wound, she has increased pain in both the wounds on the left lower leg especially the more distal one on the lateral aspect 8/12-Patient returns at 1 week and she is agreeable to having debridement in both wounds on her left leg today. We have been using Sorbact, and vascular studies were reviewed at last visit 8/19; the patient arrives with her wounds fairly clean and no debridement is required. We have used Sorbact which is really done a nice job in cleaning up these very difficult wound surfaces. The patient saw Dr. Donzetta Matters of vascular surgery on 7/31. He did not feel that there was an arterial component. He felt that her treated greater saphenous vein is adequately  addressed and that the small saphenous vein did not appear to be involved significantly. She was also noted to have deep venous reflux which is not treatable. Dr. Donzetta Matters mentioned the possibility of a central obstructive component leading to reflux and he offered her central venography. She wanted to discuss this or think about it. I have urged her to go ahead with this. She has had recurrent difficult wounds in these areas which do heal but after months in the clinic. If there is anything that can be done to reduce the likelihood of this I think it is worth it. 9/2 she is still working towards getting follow-up with Dr. Donzetta Matters to schedule her CT. Things are quite a bit worse venography. I put Apligraf on 2 weeks ago on both wounds on the medial and lateral part of her left lower leg. She arrives in clinic today with 3 superficial additional wounds above the area laterally and one below the wound medially. She describes a lot of discomfort. I think these are probably wrapped injuries. Does not look like she has cellulitis. 07/20/2019 on evaluation today patient appears to be doing somewhat poorly in regard to her lower extremity ulcers. She in fact showed signs of erythema in fact we may even be dealing with an infection at this time. Unfortunately I am unsure if this is just infection or if indeed there may be some allergic reaction that occurred as a result of the Apligraf application. With  that being said that would be unusual but nonetheless not impossible in this patient is one who is unfortunately allergic to quite a bit. Currently we have been using the Sorbact which seems to do as well as anything for her. I do think we may want to obtain a culture today to see if there is anything showing up there that may need to be addressed. [name], [name] 9/16; noted that last week the wounds look worse in 1 week follow-up of the Apligraf. Using Sorbact as of 2 days ago. She arrives with  copious amounts of drainage and new skin breakdown on the back of the left calf. The wounds arm more substantial bilaterally. There is a fair amount of swelling in the left calf no overt DVT there is edema present I think in the left greater than right thigh. She is supposed to go on 9/28 for CT venography. The wounds on the medial and lateral calf are worse and she has new skin breakdown posteriorly at least new for me. This is almost developing into a circumferential wound area The Apligraf was taken off last week which I agree with things are not going in the right direction a culture was done we do not have that back yet. She is on Augmentin that she started 2 days ago 9/23; dressing was changed by her nurses on Monday. In general there is no improvement in the wound areas although the area looks less angry than last week. She did get Augmentin for MSSA cultured on the 14th. She still appears to have too much swelling in the left leg even with 3 layer compression 9/30; the patient underwent her procedure on 9/28 by Dr. Donzetta [facility]. She was discovered to have the common iliac vein measuring 12.2 mm but at the level of L4-L5 measured 3 mm. After stenting it measured 10 mm. 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 Rouleaux flow in the common femoral and femoral vein was observed much improved after stenting. We are using silver alginate to the wounds on the medial and lateral ankle on the left. 4 layer compression 10/7; the patient had fluid swelling around her knee and 4 layer compression. At the advice of vein and vascular this was reduced to 3 layer which she is tolerating better. We have been using silver alginate under 3 layer compression since last Friday 10/14; arrives with the areas on the left ankle looking a lot better. Inflammation in the area also a lot better. She came in for a nurse check on 10/9 10/21; continued nice improvement. Slight  improvements in surface area of both the medial and lateral wounds on the left. A lot of the satellite lesions in the weeping erythema around these from stasis dermatitis is resolved. We have been using silver alginate 10/28; general improvement in the entire wound areas although not a lot of change in dimensions the wound certainly looks better. There is a lot less in terms of venous inflammation. Continue silver alginate this week however look towards Hydrofera Blue next week 11/4; very adherent debris on the medial wound left wound is not as bad. We have been using silver alginate. Change to [facility] today 11/11; very adherent debris on both wound areas. She went to vein and vascular last week and follow-up they put in [place] boot on this today. He says the [facility] was adherent. Wound is definitely not as good as last week. Especially on the  left there the satellite lesions look more prominent 11/18; absolutely no better. erythema on lateral aspect with tenderness. 09/30/2019 on evaluation today patient appears to actually be doing better. Dr. Dellia Nims did put her on doxycycline last week which I do believe has helped her at this point. Fortunately there is no signs of active infection at this time. No fevers, chills, nausea, vomiting, or diarrhea. I do believe he may want extend the doxycycline for 7 additional days just to ensure everything does completely cleared up the patient is in agreement with that plan. Otherwise she is going require some sharp debridement today 12/2; patient is completing a 2-week course of doxycycline. I gave her this empirically for inflammation as well as infection when I last saw her 2 weeks ago. All of this seems to be better. She is using silver alginate she has the area on the medial aspect of the larger area laterally and the 2 small satellite regions laterally above the major wound. 12/9; the patient's wound on the left medial and left lateral calf  look really quite good. We have been using silver alginate. She saw vein and vascular in follow-up on 10/09/2019. She has had a previous left greater saphenous vein ablation by Dr. Oscar La in 2016. More recently she underwent a left common iliac vein stent by Dr. 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 The swelling is improved and certainly the wounds have improved. The patient shows [place] today area on the right medial calf there is almost no wound but leaking lymphedema. She says she start this started 3 or 4 days ago. She did not traumatize it. It is not painful. She does not wear compression on that side 12/16; the patient continues to do well laterally. Medially still requiring debridement. The area on the right calf did not materialize to anything and is not currently open. We wrapped this last time. She has support stockings for that leg although I am not sure they are going to provide adequate compression Electronic Signature(s) Signed: 10/21/2019 4:44:44 PM By: Linton Ham MD POLETH, WALSON ([id number]) Entered By: Linton Ham on 10/21/2019 13:47:03 Eichenberger, Tenna [name] ([id number]) -------------------------------------------------------------------------------- Physical Exam Details Patient Name: HILIANA, POITRA. Date of Service: 10/21/2019 12:45 PM Medical Record Number: [id number] Patient Account Number: [id number] Date of Birth/Sex: 12-18-1945 (73 y.o. F) Treating RN: Cornell Barman Primary Care Provider: [name] Other Clinician: Referring Provider: [name] Treating Provider/Extender: Tito Dine in Treatment: 10 Constitutional Patient is hypertensive.. Pulse regular and within target range for patient.Marland Kitchen Respirations regular, non-labored and within target range.. Temperature is normal and within the target range for the patient.Marland Kitchen appears in no distress. Eyes Conjunctivae clear. No discharge. Respiratory Respiratory effort is easy and  symmetric bilaterally. Rate is normal at rest and on room air.. Cardiovascular Pedal pulses palpable and strong bilaterally.Marland Kitchen edema is well controlled. Integumentary (Hair, Skin) Severe venous hypertension changes. Psychiatric No evidence of depression, anxiety, or agitation. Calm, cooperative, and communicative. Appropriate interactions and affect.. Notes Wound exam oThe area on the left lateral. lower leg continues to improve. Left medial lower leg still requiring debridement with the tightly adherent fibrinous debris under illumination. Electronic Signature(s) Signed: 10/21/2019 4:44:44 PM By: Linton Ham MD Entered By: Linton Ham on 10/21/2019 13:49:05 Clemon, Tenna [name] ([id number]) -------------------------------------------------------------------------------- Physician Orders Details Patient Name: ALOMA, ORDNER. Date of Service: 10/21/2019 12:45 PM Medical Record Number: [id number] Patient Account Number: [id number] Date of Birth/Sex: 1945-12-14 (73 y.o. F) Treating RN: Cornell Barman Primary  Care Provider: [name] Other Clinician: Referring Provider: [name] Treating Provider/Extender: Tito Dine in Treatment: 3 Verbal / Phone Orders: No Diagnosis Coding Wound Cleansing Wound #5 Left,Medial Lower Leg o Cleanse wound with mild soap and water Wound #6 Left,Lateral Lower Leg o Cleanse wound with mild soap and water Wound #9 Left,Lateral,Posterior Lower Leg o Cleanse wound with mild soap and water Anesthetic (add to Medication List) Wound #5 Left,Medial Lower Leg o Topical Lidocaine 4% cream applied to wound bed prior to debridement (In Clinic Only). Wound #6 Left,Lateral Lower Leg o Topical Lidocaine 4% cream applied to wound bed prior to debridement (In Clinic Only). Wound #9 Left,Lateral,Posterior Lower Leg o Topical Lidocaine 4% cream applied to wound bed prior to debridement (In Clinic Only). Skin Barriers/Peri-Wound  Care Wound #5 Left,Medial Lower Leg o Triamcinolone Acetonide Ointment (TCA) Wound #6 Left,Lateral Lower Leg o Triamcinolone Acetonide Ointment (TCA) Wound #9 Left,Lateral,Posterior Lower Leg o Triamcinolone Acetonide Ointment (TCA) Primary Wound Dressing Wound #5 Left,Medial Lower Leg o Silver Alginate - adaptic for sticking Wound #6 Left,Lateral Lower Leg o Silver Alginate - adaptic for sticking Wound #9 Left,Lateral,Posterior Lower Leg o Silver Alginate - adaptic for sticking Secondary Dressing Wound #5 Left,Medial Lower Leg o ABD pad ALEXSANDRIA, BILLETT. ([id number]) Wound #6 Left,Lateral Lower Leg o ABD pad Wound #9 Left,Lateral,Posterior Lower Leg o ABD pad Dressing Change Frequency Wound #5 Left,Medial Lower Leg o Change dressing every week o Other: - as needed. Wound #6 Left,Lateral Lower Leg o Change dressing every week o Other: - as needed. Wound #9 Left,Lateral,Posterior Lower Leg o Change dressing every week o Other: - as needed. Follow-up Appointments Wound #5 Left,Medial Lower Leg o Return Appointment in 1 week. o Nurse Visit as needed - Call of needed Wound #6 Left,Lateral Lower Leg o Return Appointment in 1 week. o Nurse Visit as needed - Call of needed Wound #9 Left,Lateral,Posterior Lower Leg o Return Appointment in 1 week. o Nurse Visit as needed - Call of needed Edema Control Wound #5 Left,Medial Lower Leg o 3 Layer Compression System - Left Lower Extremity - unna to anchor Wound #6 Left,Lateral Lower Leg o 3 Layer Compression System - Left Lower Extremity - unna to anchor o 3 Layer Compression System - Bilateral - unna to anchor Wound #9 Left,Lateral,Posterior Lower Leg o 3 Layer Compression System - Left Lower Extremity - unna to anchor o 3 Layer Compression System - Bilateral - unna to anchor Electronic Signature(s) Signed: 10/21/2019 4:44:44 PM By: Linton Ham MD Signed: 10/22/2019 11:42:17  AM By: [name], [name], RN, CWS, [name], [name] Entered By: [name], [name], RN, CWS, Kim on 10/21/2019 13:22:17 [name], [name] -------------------------------------------------------------------------------- Problem List Details Patient Name: MANNAT, THUMANN. Date of Service: 10/21/2019 12:45 PM Medical Record Number: [id number] Patient Account Number: [id number] Date of Birth/Sex: [birth year] (73 y.o. F) Treating RN: Cornell Barman Primary Care Provider: [name] Other Clinician: Referring Provider: [name] Treating Provider/Extender: Tito Dine in Treatment: 14 Active Problems ICD-10 Evaluated Encounter Code Description Active Date Today Diagnosis L97.221 Non-pressure chronic ulcer of left calf limited to breakdown of 01/07/2019 No Yes skin I87.321 Chronic venous hypertension (idiopathic) with inflammation of 12/10/2018 No Yes right lower extremity I89.0 Lymphedema, not elsewhere classified 12/10/2018 No Yes L97.818 Non-pressure chronic ulcer of other part of right lower leg 10/14/2019 No Yes with other specified severity Inactive Problems Resolved Problems Electronic Signature(s) Signed: 10/21/2019 4:44:44 PM By: Linton Ham MD Entered By: Linton Ham on 10/21/2019  13:43:00 CYLAH, FRIEDT ([id number]) -------------------------------------------------------------------------------- Progress Note Details Patient Name: LATRISHA, TONKINSON. Date of Service: 10/21/2019 12:45 PM Medical Record Number: [id number] Patient Account Number: [id number] Date of Birth/Sex: October 25, 1946 (73 y.o. F) Treating RN: Cornell Barman Primary Care Provider: [name] Other Clinician: Referring Provider: [name] Treating Provider/Extender: Tito Dine in Treatment: 21 Subjective History of Present Illness (HPI) Pleasant 73 year old with history of chronic venous insufficiency. No diabetes or peripheral vascular disease. Left ABI 1.29. Questionable  history of left lower extremity DVT. She developed a recurrent ulceration on her left lateral calf in December 2015, which she attributes to poor diet and subsequent lower extremity edema. She underwent endovenous laser ablation of her left greater saphenous vein in 2010. She underwent laser ablation of accessory branch of left GSV in April 2016 by Dr. Kellie Simmering at [facility]. She was previously wearing Unna boots, which she tolerated well. Tolerating 2 layer compression and cadexomer iodine. She returns to clinic for follow-up and is without new complaints. She denies any significant pain at this time. She reports persistent pain with pressure. No claudication or ischemic rest pain. No fever or chills. No drainage. READMISSION 11/13/16; this is a 73 year old woman who is not a diabetic. She is here for a review of a painful area on her left medial lower extremity. I note that she was seen here previously last year for wound I believe to be in the same area. At that time she had undergone previously a left greater saphenous vein ablation by Dr. Kellie Simmering and she had a ablation of the anterior accessory branch of the left greater saphenous vein in March 2016. Seeing that the wound actually closed over. In reviewing the history with her today the ulcer in this area has been recurrent. She describes a biopsy of this area in 2009 that only showed stasis physiology. 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 Do not see that she has ever had arterial studies in the left leg. She tells me she has a follow-up with Dr. Kellie Simmering in roughly 10 days In any case she developed the reopening of this area roughly a month ago. On the background of this she describes rapidly increasing edema which has  responded to Lasix 40 mg and metolazone 2.5 mg as well as the patient's lymph massage. She has been told she has both venous insufficiency and lymphedema but she cannot tolerate compression stockings 11/28/16; the patient saw Dr. Kellie Simmering recently. Per the patient he did arterial Dopplers in the office that did not show evidence of arterial insufficiency, per the patient he stated "treat this like an ordinary venous ulcer". She also saw her dermatologist Dr. Ronnald Ramp who felt that this was more of a vascular ulcer. In general things are improving although she arrives today with increasing bilateral lower extremity edema with weeping a deeper fluid through the wound on the left medial leg compatible with some degree of lymphedema 12/04/16; the patient's wound is fully epithelialized but I don't think fully healed. We will do another week of depression with Promogran and TCA however I suspect we'll be able to discharge her next week. This is a very unusual-looking wound which was initially a figure-of-eight type wound lying on its side surrounded by petechial like hemorrhage. She  has had venous ablation on this side. She apparently does not have an arterial issue per Dr. Kellie Simmering. She saw her dermatologist thought it was "vascular". Patient is definitely going to need ongoing compression and I talked about this with her today she will go to elastic therapy after she leaves here next week 12/11/16; the patient's wound is not completely closed today. She has surrounding scar tissue and in further discussion with the patient it would appear that she had ulcers in this area in 2009 for a prolonged period of time ultimately requiring a punch biopsy of this area that only showed venous insufficiency. I did not previously pickup on this part of the history from the patient. 12/18/16; the patient's wound is completely epithelialized. There is no open area here. She has significant bilateral venous insufficiency with  secondary lymphedema to a mild-to-moderate degree she does not have compression stockings.. She did not say anything to me when I was in the room, she told our intake nurse that she was still having pain in this area. This isn't unusual recurrent small open area. She is going to go to elastic therapy to obtain compression stockings. 12/25/16; the patient's wound is fully epithelialized. There is no open area here. The patient describes some continued episodic discomfort in this area medial left calf. However everything looks fine and healed here. She is been to elastic therapy and RADIA, FALSO ([id number]) caught herself 15-20 mmHg stockings, they apparently were having trouble getting 20-30 mm stockings in her size 01/22/17; this is a patient we discharged from the clinic a month ago. She has a recurrent open wound on her medial left calf. She had 15 mm support stockings. I told her I thought she needed 20-30 mm compression stockings. She tells me that she has been ill with hospitalization secondary to asthma and is been found to have severe hypokalemia likely secondary to a combination of Lasix and metolazone. This morning she noted blistering and leaking fluid on the posterior part of her left leg. She called our intake nurse urgently and we was saw her this afternoon. She has not had any real discomfort here. I don't know that she's been wearing any stockings on this leg for at least 2-3 days. ABIs in this clinic were 1.21 on the right and 1.3 on the left. She is previously seen vascular surgery who does not think that there is a peripheral arterial issue. 01/30/17; Patient arrives with no open wound on the left leg. She has been to elastic therapy and obtained 20-78mmhg below knee stockings and she has one on the right leg today. READMISSION 02/19/18; this Vanegas is a now 73 year old patient we've had in this clinic perhaps 3 times before. I had last looked at her from January 07 December 2016  with an area on the medial left leg. We discharged her on 12/25/16 however she had to be readmitted on 01/22/17 with a recurrence. I have in my notes that we discharged her on 20-30 mm stockings although she tells me she was only wearing support hose because she cannot get stockings on predominantly related to her cervical spine surgery/issues. She has had previous ablations done by vein and vascular in [place] including a great saphenous vein ablation on the left with an anterior accessory branch ablation I think both of these were in 2016. On one of the previous visit she had a biopsy noted 2009 that was negative. She is not felt to have an arterial issue. She is not  a diabetic. She does have a history of obstructive sleep apnea hypertension asthma as well as chronic venous insufficiency and lymphedema. On this occasion she noted 2 dry scaly patch on her left leg. She tried to put lotion on this it didn't really help. There were 2 open areas.the patient has been seeing her primary physician from 02/05/18 through 02/14/18. She had Unna boots applied. The superior wound now on the lateral left leg has closed but she's had one wound that remains open on the lateral left leg. This is not the same spot as we dealt with in 2018. ABIs in this clinic were 1.3 bilaterally 02/26/18; patient has a small wound on the left lateral calf. Dimensions are down. She has chronic venous insufficiency and lymphedema. 03/05/18; small open area on the left lateral calf. Dimensions are down. Tightly adherent necrotic debris over the surface of the wound which was difficult to remove. Also the dressing [over collagen] stuck to the wound surface. This was removed with some difficulty as well. Change the primary dressing to Hydrofera Blue ready 03/12/18; small open area on the left lateral calf. Comes in with tightly adherent surface eschar as well as some adherent Hydrofera Blue. 03/19/18; open area on the left lateral calf.  Again adherent surface eschar as well as some adherent Hydrofera Blue nonviable subcutaneous tissue. She complained of pain all week even with the reduction from 4-3 layer compression I put on last week. Also she had an increase in her ankle and calf measurements probably related to the same thing. 03/26/18; open area on the left lateral calf. A very small open area remains here. We used silver alginate starting last week as the Hydrofera Blue seem to stick to the wound bed. In using 4-layer compression 04/02/18; the open area in the left lateral calf at some adherent slough which I removed there is no open area here. We are able to transition her into her own compression stocking. Truthfully I think this is probably his support hose. However this does not maintain skin integrity will be limited. She cannot put over the toe compression stockings on because of neck problems hand problems etc. She is allergic to the lining layer of juxta lites. We might be forced to use extremitease stocking should this fail READMIT 11/24/2018 Patient is now a 73 year old woman who is not a diabetic. She has been in this clinic on at least 3 previous occasions largely with recurrent wounds on her left leg secondary to chronic venous insufficiency with secondary lymphedema. Her situation is complicated by inability to get stockings on and an allergy to neoprene which is apparently a component and at least juxta lites and other stockings. As a result she really has not been wearing any stockings on her legs. She tells [place] that roughly 2 or 3 weeks ago she started noticing a stinging sensation just above her ankle on the left medial aspect. She has been diagnosed with pseudogout and she wondered whether this was what she was experiencing. She tried to dress this with something she bought at the store however subsequently it pulled skin off and now she has an open wound that is not improving. She has been using Vaseline  gauze with a cover bandage. She saw her primary doctor last week who put an Haematologist on her. ABIs in this clinic was 1.03 on the left YOMARI, EASLER. ([id number]) 2/12; the area is on the left medial ankle. Odd-looking wound with what looks to be surface epithelialization  but a multitude of small petechial openings. This clearly not closed yet. We have been using silver alginate under 3 layer compression with TCA 2/19; the wound area did not look quite as good this week. Necrotic debris over the majority of the wound surface which required debridement. She continues to have a multitude of what looked to be small petechial openings. She reminds [place] that she had a biopsy on this initially during her first outbreak in 2015 in [place] [facility]. She expresses concern about this being a possible melanoma. 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 I assured her that this does not look anything like melanoma. Except for the petechial reaction it does look like a venous insufficiency area and she certainly has evidence of this on both sides 2/26; a difficult area on the left medial ankle. The patient clearly has chronic venous hypertension with some degree of lymphedema. The odd thing about the area is the small petechial hemorrhages. I am not really sure how to explain this. This was present last time and this is not a compression injury. We have been using Hydrofera Blue which I changed to last week 3/4; still using Hydrofera Blue. Aggressive debridement today. She does not have known arterial issues. She has seen Dr. Kellie Simmering at [facility] vein and vascular and and has an ablation on the left. [Anterior accessory branch of the greater saphenous]. From what I remember they did not feel she had an arterial issue. The patient has had this area biopsied in 2009 at [facility] dermatology and by her recollection they  said this was "stasis". She is also follow-up with dermatology locally who thought that this was more of a vascular issue 3/11; using Hydrofera Blue. Aggressive debridement today. She does not have an arterial issue. We are using 3 layer compression although we may need to go to 4. The patient has been in for multiple changes to her wrap since I last saw her a week ago. She says that the area was leaking. I do not have too much more information on what was found 01/19/19 on evaluation today patient was actually being seen for a nurse visit when unfortunately she had the area on her left lateral lower extremity as well as weeping from the right lower extremity that became apparent. Therefore we did end up actually seeing her for a full visit with myself. She is having some pain at this site as well but fortunately nothing too significant at this point. No fevers, chills, nausea, or vomiting noted at this time. 3/18-Patient is back to the clinic with the left leg venous leg ulcer, the ulcer is larger in size, has a surface that is densely adherent with fibrinous tissue, the Hydrofera Blue was used but is densely adherent and there was difficulty in removing it. The right lower extremity was also wrapped for weeping edema. Patient has a new area over the left lateral foot above the malleolus that is small and appears to have no debris with intact surrounding skin. Patient is on increased dose of Lasix also as a means to edema management 3/25; the patient has a nonhealing venous ulcer on the medial left leg and last week developed a smaller area on the lateral left calf. We have been using Hydrofera Blue with a contact layer. 4/1; no major change in these wounds areas. Left medial and more recently left lateral calf. I tried Iodoflex last week to  aid in debridement she did not tolerate this. She stated her pain was terrible all week. She took the top layer of the 4 layer compression off. 4/8; the  patient actually looks somewhat better in terms of her more prominent left lateral calf wound. There is some healthy looking tissue here. She is still complaining of a lot of discomfort. 4/15; patient in a lot of pain secondary to sciatica. She is on a prednisone taper prescribed by her primary physician. She has the 2 areas one on the left medial and more recently a smaller area on the left lateral calf. Both of these just above the malleoli 4/22; her back pain is better but she still states she is very uncomfortable and now feels she is intolerant to the [employer]. No real change in the wounds we have been using Sorbact. She has been previously intolerant to Iodoflex. There is not a lot of option about what we can use to debride this wound under compression that she no doubt needs. sHe states Ultram no longer works for her pain 4/29; no major change in the wounds slightly increased depth. Surface on the original medial wound perhaps somewhat improved however the more recent area on the lateral left ankle is 100% covered in very adherent debris we have been using Sorbact. She tolerates 4 layer compression well and her edema control is a lot better. She has not had to come in for a nurse check 5/6; no major change in the condition of the wounds. She did consent to debridement today which was done with some difficulty. Continuing Sorbact. She did not tolerate Iodoflex. She was in for a check of her compression the day after we wrapped her last week this was adjusted but nothing much was found 5/13; no major change in the condition or area of the wounds. I was able to get a fairly aggressive debridement done on the lateral left leg wound. Even using Sorbact under compression. She came back in on Friday to have the wrap changed. She says she felt uncomfortable on the lateral aspect of her ankle. She has a long history of chronic venous insufficiency including previous ablation surgery on this  side. 5/20-Patient returns for wounds on left leg with both wounds covered in slough, with the lateral leg wound larger in size, she has been in 3 layer compression and felt more comfortable, she describes pain in ankle, in leg and pins and needles in foot, and is about to try Pamelor for this 6/3; wounds on the left lateral and left medial leg. The area medially which is the most recent of the 2 seems to have had the largest increase in dimensions. We have been using Sorbac to try and debride the surface. She has been to see orthopedics [name], [name] they apparently did a plain x-ray that was indeterminant. Diagnosed her with neuropathy and they have ordered an MRI to determine if there is underlying osteomyelitis. This was not high on my thought list but I suppose it is prudent. We have advised her to make an appointment with vein and vascular in [place]. She has a history of a left greater saphenous and accessory vein ablations I wonder if there is anything else that can be done from a surgical point of view to help in these difficult refractory wounds. We have previously healed this wound on one occasion but it keeps on reopening [medial side] 6/10; deep tissue culture I did last week I think on the  left medial wound showed both moderate E. coli and moderate staph aureus [MSSA]. She is going to require antibiotics and I have chosen Augmentin. We have been using Sorbact and we have made better looking wound surface on both sides but certainly no improvement in wound area. She was back in last Friday apparently for a dressing changes the wrap was hurting her outer left ankle. She has not managed to get a hold of vein and vascular in [place]. We are going to have to make her that appointment 6/17; patient is tolerating the Augmentin. She had an MRI that I think was ordered by orthopedic surgeon this did not show osteomyelitis or an abscess did suggest cellulitis. We have been  using Sorbact to the lateral and medial ankles. We have been trying to arrange a follow-up appointment with vein and vascular in [place] or did her original ablations. We apparently an area sent the request to vein and vascular in [facility] 6/24; patient has completed the Augmentin. We do not yet have a vein and vascular appointment in [place]. I am not sure what the issue is here we have asked her to call tomorrow. We are using Sorbact. Making some improvements and especially the medial wound. Both surfaces however look better medial and lateral. 7/1; the patient has been in contact with vein and vascular in [place] but has not yet received an appointment. Using Sorbact we have gradually improve the wound surface with no improvement in surface area. She is approved for Apligraf but the wound surface still is not completely viable. She has not had to come in for a dressing change 7/8; the patient has an appointment with vein and vascular on 7/31 which is a Friday afternoon. She is concerned about getting back here for [place] to dress her wounds. I think it is important to have them goal for her venous reflux/history of ablations etc. to see if anything else can be done. She apparently tested positive for 1 of the blood tests with regards to lupus and saw a rheumatologist. He has raised the issue of vasculitis again. I have had this thought in the past however the evidence seems overwhelming that this is a venous reflux etiology. If the rheumatologist tells me there is clinical and laboratory investigation is positive for lupus I will rethink this. 7/15; the patient's wound surfaces are quite a bit better. The medial area which was her original wound now has no depth although the lateral wound which was the more recent area actually appears larger. Both with viable surfaces which is indeed better. Using Sorbact. I wanted to use Apligraf on her however there is the issue of the vein and vascular  appointment on 7/31 at 2:00 in the afternoon which would not allow her to get back to be rewrapped and they would no doubt remove the graft 7/22; the patient's wound surfaces have moderate amount of debris although generally look better. The lateral one is larger with 2 small satellite areas superiorly. We are waiting for her vein and vascular appointment on 7/31. She has been approved for Apligraf which I would like to use after th 7/29; wound surfaces have improved no debridement is required we have been using Sorbact. She sees vein and vascular on Friday with this so question of whether anything can be done to lessen the likelihood of recurrence and/or speed the healing of these areas. She is already had previous ablations. She no doubt has severe venous hypertension 8/5-Patient returns at 1 week, she  was in [employer] for 3 days by her podiatrist, we have been using so backed to the wound, she has increased pain in both the wounds on the left lower leg especially the more distal one on the lateral aspect 8/12-Patient returns at 1 week and she is agreeable to having debridement in both wounds on her left leg today. We have been using Sorbact, and vascular studies were reviewed at last visit 8/19; the patient arrives with her wounds fairly clean and no debridement is required. We have used Sorbact which is really done a nice job in cleaning up these very difficult wound surfaces. The patient saw Dr. Donzetta Matters of vascular surgery on 7/31. He did not feel that there was an arterial component. He felt that her treated greater saphenous vein is adequately addressed and that the small saphenous vein did not appear to be involved significantly. She was also noted to have deep venous reflux which is not treatable. Dr. Donzetta Matters mentioned the possibility of a central obstructive component leading to reflux and he offered her central venography. She wanted to discuss this or think about it. I have urged her to go  ahead with this. She has had recurrent difficult wounds in these areas which do heal but after months in the clinic. If there is anything that can be done to reduce the likelihood of this I think it is worth it. 9/2 she is still working towards getting follow-up with Dr. Donzetta Matters to schedule her CT. Things are quite a bit worse venography. I put Apligraf on 2 weeks ago on both wounds on the medial and lateral part of her left lower leg. She arrives in clinic today with 3 superficial additional wounds above the area laterally and one below the wound medially. She describes a lot of discomfort. I think these are probably wrapped injuries. Does not look like she has cellulitis. 07/20/2019 on evaluation today patient appears to be doing somewhat poorly in regard to her lower extremity ulcers. She in fact showed signs of erythema in fact we may even be dealing with an infection at this time. Unfortunately I am unsure if this is just infection or if indeed there may be some allergic reaction that occurred as a result of the Apligraf application. With that being said that would be unusual but nonetheless not impossible in this patient is one who is unfortunately allergic to quite a bit. Currently we have been using the Sorbact which seems to do as well as anything for her. I do think we may want to obtain [name], [name] a culture today to see if there is anything showing up there that may need to be addressed. 9/16; noted that last week the wounds look worse in 1 week follow-up of the Apligraf. Using Sorbact as of 2 days ago. She arrives with copious amounts of drainage and new skin breakdown on the back of the left calf. The wounds arm more substantial bilaterally. There is a fair amount of swelling in the left calf no overt DVT there is edema present I think in the left greater than right thigh. She is supposed to go on 9/28 for CT venography. The wounds on the medial and lateral calf are worse  and she has new skin breakdown posteriorly at least new for me. This is almost developing into a circumferential wound area The Apligraf was taken off last week which I agree with things are not going in the right direction a culture was done we  do not have that back yet. She is on Augmentin that she started 2 days ago 9/23; dressing was changed by her nurses on Monday. In general there is no improvement in the wound areas although the area looks less angry than last week. She did get Augmentin for MSSA cultured on the 14th. She still appears to have too much swelling in the left leg even with 3 layer compression 9/30; the patient underwent her procedure on 9/28 by Dr. Donzetta Matters at vascular and vein specialist. She was discovered to have the common iliac vein measuring 12.2 mm but at the level of L4-L5 measured 3 mm. After stenting it measured 10 mm. 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 Rouleaux flow in the common femoral and femoral vein was observed much improved after stenting. We are using silver alginate to the wounds on the medial and lateral ankle on the left. 4 layer compression 10/7; the patient had fluid swelling around her knee and 4 layer compression. At the advice of vein and vascular this was reduced to 3 layer which she is tolerating better. We have been using silver alginate under 3 layer compression since last Friday 10/14; arrives with the areas on the left ankle looking a lot better. Inflammation in the area also a lot better. She came in for a nurse check on 10/9 10/21; continued nice improvement. Slight improvements in surface area of both the medial and lateral wounds on the left. A lot of the satellite lesions in the weeping erythema around these from stasis dermatitis is resolved. We have been using silver alginate 10/28; general improvement in the entire wound areas although not a lot of change in dimensions the wound certainly looks better. There is a  lot less in terms of venous inflammation. Continue silver alginate this week however look towards Hydrofera Blue next week 11/4; very adherent debris on the medial wound left wound is not as bad. We have been using silver alginate. Change to [facility] today 11/11; very adherent debris on both wound areas. She went to vein and vascular last week and follow-up they put in [place] boot on this today. He says the [facility] was adherent. Wound is definitely not as good as last week. Especially on the left there the satellite lesions look more prominent 11/18; absolutely no better. erythema on lateral aspect with tenderness. 09/30/2019 on evaluation today patient appears to actually be doing better. Dr. Dellia Nims did put her on doxycycline last week which I do believe has helped her at this point. Fortunately there is no signs of active infection at this time. No fevers, chills, nausea, vomiting, or diarrhea. I do believe he may want extend the doxycycline for 7 additional days just to ensure everything does completely cleared up the patient is in agreement with that plan. Otherwise she is going require some sharp debridement today 12/2; patient is completing a 2-week course of doxycycline. I gave her this empirically for inflammation as well as infection when I last saw her 2 weeks ago. All of this seems to be better. She is using silver alginate she has the area on the medial aspect of the larger area laterally and the 2 small satellite regions laterally above the major wound. 12/9; the patient's wound on the left medial and left lateral calf look really quite good. We have been using silver alginate. She saw vein and vascular in follow-up on 10/09/2019. She has had a previous left greater saphenous vein ablation by Dr.  Lassen in 2016. More recently she underwent a left common iliac vein stent by Dr. 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 The swelling is improved and certainly the  wounds have improved. The patient shows [place] today area on the right medial calf there is almost no wound but leaking lymphedema. She says she start this started 3 or 4 days ago. She did not traumatize it. It is not painful. She does not wear compression on that side 12/16; the patient continues to do well laterally. Medially still requiring debridement. The area on the right calf did not materialize to anything and is not currently open. We wrapped this last time. She has support stockings for that leg although I am not sure they are going to provide adequate compression [name], [name]. ([id number]) Objective Constitutional Patient is hypertensive.. Pulse regular and within target range for patient.Marland Kitchen Respirations regular, non-labored and within target range.. Temperature is normal and within the target range for the patient.Marland Kitchen appears in no distress. Vitals Time Taken: 12:53 PM, Height: 63 in, Weight: 224.7 lbs, BMI: 39.8, Temperature: 98.5 F, Pulse: 88 bpm, Respiratory Rate: 16 breaths/min, Blood Pressure: 155/68 mmHg. Eyes Conjunctivae clear. No discharge. Respiratory Respiratory effort is easy and symmetric bilaterally. Rate is normal at rest and on room air.. Cardiovascular Pedal pulses palpable and strong bilaterally.Marland Kitchen edema is well controlled. Psychiatric No evidence of depression, anxiety, or agitation. Calm, cooperative, and communicative. Appropriate interactions and affect.. General Notes: Wound exam The area on the left lateral. lower leg continues to improve. Left medial lower leg still requiring debridement with the tightly adherent fibrinous debris under illumination. Integumentary (Hair, Skin) Severe venous hypertension changes. Wound #5 status is Open. Original cause of wound was Gradually Appeared. The wound is located on the Left,Medial Lower Leg. The wound measures 2.5cm length x 2.3cm width x 0.2cm depth; 4.516cm^2 area and 0.903cm^3 volume. There is Fat Layer  (Subcutaneous Tissue) Exposed exposed. There is no tunneling or undermining noted. There is a large amount of serosanguineous drainage noted. The wound margin is flat and intact. There is medium (34-66%) pale granulation within the wound bed. There is a medium (34-66%) amount of necrotic tissue within the wound bed including Adherent Slough. Wound #6 status is Open. Original cause of wound was Gradually Appeared. The wound is located on the Left,Lateral Lower Leg. The wound measures 2.8cm length x 2.5cm width x 0.2cm depth; 5.498cm^2 area and 1.1cm^3 volume. There is Fat Layer (Subcutaneous Tissue) Exposed exposed. There is no tunneling or undermining noted. There is a large amount of serosanguineous drainage noted. The wound margin is flat and intact. There is medium (34-66%) pink granulation within the wound bed. There is a medium (34-66%) amount of necrotic tissue within the wound bed including Adherent Slough. Wound #9 status is Open. Original cause of wound was Gradually Appeared. The wound is located on the Left,Lateral,Posterior Lower Leg. The wound measures 0.6cm length x 0.6cm width x 0.1cm depth; 0.283cm^2 area and 0.028cm^3 volume. There is Fat Layer (Subcutaneous Tissue) Exposed exposed. There is no tunneling or undermining noted. There is a large amount of serosanguineous drainage noted. The wound margin is flat and intact. There is large (67- 100%) pink granulation within the wound bed. There is a small (1-33%) amount of necrotic tissue within the wound bed including Adherent Slough. [name], BANGS ([id number]) Assessment Active Problems ICD-10 Non-pressure chronic ulcer of left calf limited to breakdown of skin Chronic venous hypertension (idiopathic) with inflammation of right lower extremity Lymphedema,  not elsewhere classified Non-pressure chronic ulcer of other part of right lower leg with other specified severity Procedures Wound #5 Pre-procedure diagnosis of Wound #5 is  a Lymphedema located on the Left,Medial Lower Leg . There was a Excisional Skin/Subcutaneous Tissue Debridement with a total area of 5.75 sq cm performed by [name] Dillon, MD. With the following instrument(s): Curette to remove Viable and Non-Viable tissue/material. Material removed includes Subcutaneous Tissue and Slough and after achieving pain control using Lidocaine. No specimens were taken. A time out was conducted at 13:18, prior to the start of the procedure. A Moderate amount of bleeding was controlled with Pressure. The procedure was tolerated well. Post Debridement Measurements: 2.5cm length x 2.3cm width x 0.2cm depth; 0.903cm^3 volume. Character of Wound/Ulcer Post Debridement is stable. Post procedure Diagnosis Wound #5: Same as Pre-Procedure Plan Wound Cleansing: Wound #5 Left,Medial Lower Leg: Cleanse wound with mild soap and water Wound #6 Left,Lateral Lower Leg: Cleanse wound with mild soap and water Wound #9 Left,Lateral,Posterior Lower Leg: Cleanse wound with mild soap and water Anesthetic (add to Medication List): Wound #5 Left,Medial Lower Leg: Topical Lidocaine 4% cream applied to wound bed prior to debridement (In Clinic Only). Wound #6 Left,Lateral Lower Leg: Topical Lidocaine 4% cream applied to wound bed prior to debridement (In Clinic Only). Wound #9 Left,Lateral,Posterior Lower Leg: Topical Lidocaine 4% cream applied to wound bed prior to debridement (In Clinic Only). Skin Barriers/Peri-Wound Care: Wound #5 Left,Medial Lower Leg: Triamcinolone Acetonide Ointment (TCA) Wound #6 Left,Lateral Lower Leg: Triamcinolone Acetonide Ointment (TCA) Wound #9 Left,Lateral,Posterior Lower Leg: Triamcinolone Acetonide Ointment (TCA) Primary Wound Dressing: Wound #5 Left,Medial Lower Leg: Silver Alginate - adaptic for sticking KELSYE, HULEN ([id number]) Wound #6 Left,Lateral Lower Leg: Silver Alginate - adaptic for sticking Wound #9 Left,Lateral,Posterior Lower  Leg: Silver Alginate - adaptic for sticking Secondary Dressing: Wound #5 Left,Medial Lower Leg: ABD pad Wound #6 Left,Lateral Lower Leg: ABD pad Wound #9 Left,Lateral,Posterior Lower Leg: ABD pad Dressing Change Frequency: Wound #5 Left,Medial Lower Leg: Change dressing every week Other: - as needed. Wound #6 Left,Lateral Lower Leg: Change dressing every week Other: - as needed. Wound #9 Left,Lateral,Posterior Lower Leg: Change dressing every week Other: - as needed. Follow-up Appointments: Wound #5 Left,Medial Lower Leg: Return Appointment in 1 week. Nurse Visit as needed - Call of needed Wound #6 Left,Lateral Lower Leg: Return Appointment in 1 week. Nurse Visit as needed - Call of needed Wound #9 Left,Lateral,Posterior Lower Leg: Return Appointment in 1 week. Nurse Visit as needed - Call of needed Edema Control: Wound #5 Left,Medial Lower Leg: 3 Layer Compression System - Left Lower Extremity - unna to anchor Wound #6 Left,Lateral Lower Leg: 3 Layer Compression System - Left Lower Extremity - unna to anchor 3 Layer Compression System - Bilateral - unna to anchor Wound #9 Left,Lateral,Posterior Lower Leg: 3 Layer Compression System - Left Lower Extremity - unna to anchor 3 Layer Compression System - Bilateral - unna to anchor 1. I am continuing with silver alginate with an Adaptic cover. 2. She has had very fibrinous surface on these areas especially medially on the left. This is especially visible under illumination. 3. She asked me about her right leg vis--vis the investigations she had ultimately ending up stenting her pelvic vein on the left. I will see what we can tell her based on the images if anything on the right Electronic Signature(s) Signed: 10/21/2019 4:44:44 PM By: Linton Ham MD Entered By: Linton Ham on 10/21/2019 13:52:31 Tisdel, Cassidie J. (  [id number]) -------------------------------------------------------------------------------- SuperBill  Details Patient Name: JESIAH, NANO. Date of Service: 10/21/2019 Medical Record Number: [id number] Patient Account Number: [id number] Date of Birth/Sex: November 03, 1946 (73 y.o. F) Treating RN: Cornell Barman Primary Care Provider: [name] Other Clinician: Referring Provider: [name] Treating Provider/Extender: Tito Dine in Treatment: 45 Diagnosis Coding ICD-10 Codes Code Description [phone] Non-pressure chronic ulcer of left calf limited to breakdown of skin I87.321 Chronic venous hypertension (idiopathic) with inflammation of right lower extremity I89.0 Lymphedema, not elsewhere classified L97.818 Non-pressure chronic ulcer of other part of right lower leg with other specified severity Facility Procedures CPT4 Code: [id number] Description: [id number] - DEB SUBQ TISSUE 20 SQ CM/< ICD-10 Diagnosis Description 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

## 2019-10-27 NOTE — Telephone Encounter (Signed)
Patient stated she went to the pharmacy today and the prescription was incorrect. On the instruction it states she takes 1 tablet by mouth once a day. And she only received 30 tablets.  The pharmacy is needing the updated script sent that has the patient taking 1 tablet by mouth twice a day . With 60 tablets.

## 2019-10-27 NOTE — Telephone Encounter (Signed)
Plz call pt to verify how she's taking potassium and lasix - I thought she was taking one of each daily.

## 2019-10-27 NOTE — Telephone Encounter (Signed)
I don't see in last OV that you changed pt's potassium to BID.

## 2019-10-28 ENCOUNTER — Other Ambulatory Visit: Payer: Self-pay

## 2019-10-28 ENCOUNTER — Encounter: Payer: Medicare Other | Admitting: Internal Medicine

## 2019-10-28 DIAGNOSIS — I890 Lymphedema, not elsewhere classified: Secondary | ICD-10-CM | POA: Diagnosis not present

## 2019-10-28 DIAGNOSIS — L97222 Non-pressure chronic ulcer of left calf with fat layer exposed: Secondary | ICD-10-CM | POA: Diagnosis not present

## 2019-10-28 DIAGNOSIS — L97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d: Secondary | ICD-10-CM | POA: Diagnosis not present

## 2019-10-28 DIAGNOSIS — I87312 Chronic venous hypertension (idiopathic) with ulcer of left lower extremity: Secondary | ICD-10-CM | POA: Diagnosis not present

## 2019-10-28 NOTE — Telephone Encounter (Signed)
Lvm for pt to call back.  Need to relay Dr. G's message.  

## 2019-10-29 MED ORDER — POTASSIUM CHLORIDE CRYS ER 20 MEQ PO TBCR: 20.0000 meq | EXTENDED_RELEASE_TABLET | Freq: Two times a day (BID) | ORAL | 3 refills | Status: DC

## 2019-10-29 NOTE — Telephone Encounter (Signed)
Spoke with pt asking about meds.  States she takes Lasix once daily at dinner time and Klor-Con BID.

## 2019-10-29 NOTE — Addendum Note (Signed)
Addended by: [name] on: 10/29/2019 12:28 PM   Modules accepted: Orders

## 2019-11-02 ENCOUNTER — Other Ambulatory Visit: Payer: Self-pay | Admitting: *Deleted

## 2019-11-02 DIAGNOSIS — I871 Compression of vein: Secondary | ICD-10-CM

## 2019-11-04 ENCOUNTER — Encounter: Payer: Medicare Other | Admitting: Internal Medicine

## 2019-11-04 ENCOUNTER — Other Ambulatory Visit: Payer: Self-pay

## 2019-11-04 DIAGNOSIS — L97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d: Secondary | ICD-10-CM | POA: Diagnosis not present

## 2019-11-04 DIAGNOSIS — L97222 Non-pressure chronic ulcer of left calf with fat layer exposed: Secondary | ICD-10-CM | POA: Diagnosis not present

## 2019-11-05 NOTE — Progress Notes (Signed)
[name] Visit Report for 11/04/2019 Arrival Information Details Patient Name: [name], [name]. Date of Service: 11/04/2019 12:45 PM Medical Record Number: [id number] Patient Account Number: [id number] Date of Birth/Sex: [birth year] (73 y.o. F) Treating RN: Army Melia Primary Care Evamaria Detore: [name] Other Clinician: Referring Aasia Peavler: [name] Treating [name]/Extender: Tito Dine in Treatment: 80 Visit Information History Since Last Visit Added or deleted any medications: No Patient Arrived: Ambulatory Any new allergies or adverse reactions: No Arrival Time: 12:55 Had a fall or experienced change in No Accompanied By: self activities of daily living that may affect Transfer Assistance: None risk of falls: Patient Identification Verified: Yes Signs or symptoms of abuse/neglect since last visito No Patient Requires Transmission-Based No Hospitalized since last visit: No Precautions: Has Dressing in Place as Prescribed: Yes Patient Has Alerts: Yes Pain Present Now: No Patient Alerts: Patient on Blood Thinner aspirin 81 Electronic Signature(s) Signed: 11/04/2019 2:59:41 PM By: Army Melia Entered By: Army Melia on 11/04/2019 12:55:48 [name], [name] ([id number]) -------------------------------------------------------------------------------- Clinic Level of Care Assessment Details Patient Name: [name] Date of Service: 11/04/2019 12:45 PM Medical Record Number: [id number] Patient Account Number: [id number] Date of Birth/Sex: 1946-01-28 (73 y.o. F) Treating RN: Harold Barban Primary Care Katarina Riebe: [name] Other Clinician: Referring Larinda Herter: [name] Treating Monigue Spraggins/Extender: Tito Dine in Treatment: 74 Clinic Level of Care Assessment Items TOOL 4 Quantity Score []  - Use when only an EandM is performed on FOLLOW-UP visit 0 ASSESSMENTS - Nursing Assessment / Reassessment X -  Reassessment of Co-morbidities (includes updates in patient status) 1 10 X- 1 5 Reassessment of Adherence to Treatment Plan ASSESSMENTS - Wound and Skin Assessment / Reassessment []  - Simple Wound Assessment / Reassessment - one wound 0 X- 3 5 Complex Wound Assessment / Reassessment - multiple wounds []  - 0 Dermatologic / Skin Assessment (not related to wound area) ASSESSMENTS - Focused Assessment []  - Circumferential Edema Measurements - multi extremities 0 []  - 0 Nutritional Assessment / Counseling / Intervention []  - 0 Lower Extremity Assessment (monofilament, tuning fork, pulses) []  - 0 Peripheral Arterial Disease Assessment (using hand held doppler) ASSESSMENTS - Ostomy and/or Continence Assessment and Care []  - Incontinence Assessment and Management 0 []  - 0 Ostomy Care Assessment and Management (repouching, etc.) PROCESS - Coordination of Care X - Simple Patient / Family Education for ongoing care 1 15 []  - 0 Complex (extensive) Patient / Family Education for ongoing care []  - 0 Staff obtains Programmer, systems, Records, Test Results / Process Orders []  - 0 Staff telephones HHA, Nursing Homes / Clarify orders / etc []  - 0 Routine Transfer to another Facility (non-emergent condition) []  - 0 Routine Hospital Admission (non-emergent condition) []  - 0 New Admissions / Biomedical engineer / Ordering NPWT, Apligraf, etc. []  - 0 Emergency Hospital Admission (emergent condition) X- 1 10 Simple Discharge Coordination [name], [name] []  - 0 Complex (extensive) Discharge Coordination PROCESS - Special Needs []  - Pediatric / Minor Patient Management 0 []  - 0 Isolation Patient Management []  - 0 Hearing / Language / Visual special needs []  - 0 Assessment of Community assistance (transportation, D/C planning, etc.) []  - 0 Additional assistance / Altered mentation []  - 0 Support Surface(s) Assessment (bed, cushion, seat, etc.) INTERVENTIONS - Wound Cleansing /  Measurement []  - Simple Wound Cleansing - one wound 0 X- 3 5 Complex Wound Cleansing - multiple wounds X- 1 5 Wound Imaging (photographs - any number of wounds) []  - 0  Wound Tracing (instead of photographs) []  - 0 Simple Wound Measurement - one wound X- 3 5 Complex Wound Measurement - multiple wounds INTERVENTIONS - Wound Dressings X - Small Wound Dressing one or multiple wounds 3 10 []  - 0 Medium Wound Dressing one or multiple wounds []  - 0 Large Wound Dressing one or multiple wounds X- 1 5 Application of Medications - topical []  - 0 Application of Medications - injection INTERVENTIONS - Miscellaneous []  - External ear exam 0 []  - 0 Specimen Collection (cultures, biopsies, blood, body fluids, etc.) []  - 0 Specimen(s) / Culture(s) sent or taken to Lab for analysis []  - 0 Patient Transfer (multiple staff / Civil Service fast streamer / Similar devices) []  - 0 Simple Staple / Suture removal (25 or less) []  - 0 Complex Staple / Suture removal (26 or more) []  - 0 Hypo / Hyperglycemic Management (close monitor of Blood Glucose) []  - 0 Ankle / Brachial Index (ABI) - do not check if billed separately X- 1 5 Vital Signs [name], [name] ([id number]) Has the patient been seen at the hospital within the last three years: Yes Total Score: 130 Level Of Care: New/Established - Level 4 Electronic Signature(s) Signed: 11/04/2019 3:13:29 PM By: Harold Barban Entered By: Harold Barban on 11/04/2019 13:09:27 [name], Amber Child ([id number]) -------------------------------------------------------------------------------- Encounter Discharge Information Details Patient Name: [name], [name]. Date of Service: 11/04/2019 12:45 PM Medical Record Number: [id number] Patient Account Number: [id number] Date of Birth/Sex: Nov 07, 1945 (73 y.o. F) Treating RN: Harold Barban Primary Care Jadis Mika: [name] Other Clinician: Referring Khylan Sawyer: [name] Treating Donaldson Richter/Extender: Tito Dine in Treatment: 1 Encounter Discharge Information Items Discharge Condition: Stable Ambulatory Status: Ambulatory Discharge Destination: Home Transportation: Private Auto Accompanied By: self Schedule Follow-up Appointment: Yes Clinical Summary of Care: Electronic Signature(s) Signed: 11/04/2019 3:13:29 PM By: Harold Barban Entered By: Harold Barban on 11/04/2019 13:13:53 Brickley, Amber Child ([id number]) -------------------------------------------------------------------------------- Lower Extremity Assessment Details Patient Name: [name], [name]. Date of Service: 11/04/2019 12:45 PM Medical Record Number: [id number] Patient Account Number: [id number] Date of Birth/Sex: 1946/10/11 (73 y.o. F) Treating RN: Army Melia Primary Care Breyanna Valera: [name] Other Clinician: Referring Amarius Toto: [name] Treating Cela Newcom/Extender: Tito Dine in Treatment: 47 Edema Assessment Assessed: [Left: No] [Right: No] Edema: [Left: N] [Right: o] Calf Left: Right: Point of Measurement: 33 cm From Medial Instep 40 cm cm Ankle Left: Right: Point of Measurement: 10 cm From Medial Instep 23 cm cm Vascular Assessment Pulses: Dorsalis Pedis Palpable: [Left:Yes] Electronic Signature(s) Signed: 11/04/2019 2:59:41 PM By: Army Melia Entered By: Army Melia on 11/04/2019 13:05:29 Perfect, Amber Child ([id number]) -------------------------------------------------------------------------------- Multi Wound Chart Details Patient Name: [name], [name]. Date of Service: 11/04/2019 12:45 PM Medical Record Number: [id number] Patient Account Number: [id number] Date of Birth/Sex: March 01, 1946 (73 y.o. F) Treating RN: Harold Barban Primary Care Coral Timme: [name] Other Clinician: Referring Naeem Quillin: [name] Treating Tylisha Danis/Extender: Tito Dine in Treatment: 73 Vital Signs Height(in): 63 Pulse(bpm): 46 Weight(lbs): 224.7 Blood  Pressure(mmHg): 146/84 Body Mass Index(BMI): 40 Temperature(F): 98.6 Respiratory Rate 16 (breaths/min): Photos: Wound Location: Left Lower Leg - Medial Left Lower Leg - Lateral Left Lower Leg - Lateral, Posterior Wounding Event: Gradually Appeared Gradually Appeared Gradually Appeared Primary Etiology: Lymphedema Venous Leg Ulcer Venous Leg Ulcer Comorbid History: Cataracts, Asthma, Sleep Cataracts, Asthma, Sleep Cataracts, Asthma, Sleep Apnea, Deep Vein Apnea, Deep Vein Apnea, Deep Vein Thrombosis, Hypertension, Thrombosis, Hypertension, Thrombosis, Hypertension, Peripheral Venous Disease, Peripheral Venous Disease, Peripheral Venous Disease, Osteoarthritis, Received Osteoarthritis, Received Osteoarthritis,  Received Chemotherapy, Received Chemotherapy, Received Chemotherapy, Received Radiation Radiation Radiation Date Acquired: 11/19/2018 01/19/2019 07/08/2019 Weeks of Treatment: 47 41 17 Wound Status: Open Open Open Measurements L x W x D 2.6x2.4x0.2 4.5x3.5x0.3 1.2x0.8x0.1 (cm) Area (cm) : 4.901 12.37 0.754 Volume (cm) : 0.98 3.711 0.075 % Reduction in Area: 42.60% -5522.70% -33.50% % Reduction in Volume: -14.60% -16768.20% -31.60% Classification: Full Thickness Without Full Thickness Without Full Thickness Without Exposed Support Structures Exposed Support Structures Exposed Support Structures Exudate Amount: Large Large Large Exudate Type: Serosanguineous Serosanguineous Serosanguineous Exudate Color: red, brown red, brown red, brown Wound Margin: Flat and Intact Flat and Intact Flat and Intact Granulation Amount: Medium (34-66%) Medium (34-66%) Large (67-100%) Granulation Quality: Pale Pink Pink Necrotic Amount: Medium (34-66%) Medium (34-66%) Small (1-33%) Boivin, Riann J. ([id number]) Exposed Structures: Fat Layer (Subcutaneous Fat Layer (Subcutaneous Fat Layer (Subcutaneous Tissue) Exposed: Yes Tissue) Exposed: Yes Tissue) Exposed: Yes Fascia: No Fascia: No Fascia:  No Tendon: No Tendon: No Tendon: No Muscle: No Muscle: No Muscle: No Joint: No Joint: No Joint: No Bone: No Bone: No Bone: No Epithelialization: None None None Treatment Notes Electronic Signature(s) Signed: 11/04/2019 4:48:37 PM By: Linton Ham MD Entered By: Linton Ham on 11/04/2019 13:13:02 [name] -------------------------------------------------------------------------------- Multi-Disciplinary Care Plan Details Patient Name: [name], [name]. Date of Service: 11/04/2019 12:45 PM Medical Record Number: [id number] Patient Account Number: [id number] Date of Birth/Sex: [birth year] (73 y.o. F) Treating RN: Harold Barban Primary Care Hawa Henly: [name] Other Clinician: Referring Acey [name]: [name] Treating Juelz Whittenberg/Extender: Tito Dine in Treatment: 46 Active Inactive Orientation to the Wound Care Program Nursing Diagnoses: Knowledge deficit related to the wound healing center program Goals: Patient/caregiver will verbalize understanding of the [facility] Program Date Initiated: 12/10/2018 Target Resolution Date: 01/09/2019 Goal Status: Active Interventions: Provide education on orientation to the wound center Notes: Soft Tissue Infection Nursing Diagnoses: Impaired tissue integrity Goals: Patient's soft tissue infection will resolve Date Initiated: 12/10/2018 Target Resolution Date: 01/09/2019 Goal Status: Active Interventions: Assess signs and symptoms of infection every visit Notes: Venous Leg Ulcer Nursing Diagnoses: Actual venous Insuffiency (use after diagnosis is confirmed) Goals: Patient will maintain optimal edema control Date Initiated: 12/10/2018 Target Resolution Date: 01/09/2019 Goal Status: Active Interventions: Assess peripheral edema status every visit. MILLI, [name] ([id number]) Treatment Activities: Therapeutic compression applied : 12/10/2018 Notes: Wound/Skin  Impairment Nursing Diagnoses: Impaired tissue integrity Goals: Patient/caregiver will verbalize understanding of skin care regimen Date Initiated: 12/10/2018 Target Resolution Date: 01/09/2019 Goal Status: Active Interventions: Assess ulceration(s) every visit Treatment Activities: Topical wound management initiated : 12/10/2018 Notes: Electronic Signature(s) Signed: 11/04/2019 3:13:29 PM By: Harold Barban Entered By: Harold Barban on 11/04/2019 [place], Amber Child ([id number]) -------------------------------------------------------------------------------- Pain Assessment Details Patient Name: [name], [name]. Date of Service: 11/04/2019 12:45 PM Medical Record Number: [id number] Patient Account Number: [id number] Date of Birth/Sex: [birth year] (73 y.o. F) Treating RN: Army Melia Primary Care Kapri Nero: [name] Other Clinician: Referring Metha Kolasa: [name] Treating Riyah Bardon/Extender: Tito Dine in Treatment: 66 Active Problems Location of Pain Severity and Description of Pain Patient Has Paino No Site Locations Pain Management and Medication Current Pain Management: Electronic Signature(s) Signed: 11/04/2019 2:59:41 PM By: Army Melia Entered By: Army Melia on 11/04/2019 12:55:53 [place], Amber Child ([id number]) -------------------------------------------------------------------------------- Patient/Caregiver Education Details Patient Name: [name], [name]. Date of Service: 11/04/2019 12:45 PM Medical Record Number: [id number] Patient Account Number: [id number] Date of Birth/Gender: 04-12-1946 (73 y.o. F) Treating RN: Harold Barban Primary Care Physician: [name] Other  Clinician: Referring Physician: [name] Treating Physician/Extender: Tito Dine in Treatment: 68 Education Assessment Education Provided To: Patient Education Topics Provided Wound/Skin Impairment: Handouts: Caring for Your  Ulcer Methods: Demonstration, Explain/Verbal Responses: State content correctly Electronic Signature(s) Signed: 11/04/2019 3:13:29 PM By: Harold Barban Entered By: Harold Barban on 11/04/2019 13:08:26 [place], Amber Child ([id number]) -------------------------------------------------------------------------------- Wound Assessment Details Patient Name: [name], [name]. Date of Service: 11/04/2019 12:45 PM Medical Record Number: [id number] Patient Account Number: [id number] Date of Birth/Sex: August 18, 1946 (73 y.o. F) Treating RN: Army Melia Primary Care Hollynn Garno: [name] Other Clinician: Referring Milarose Savich: [name] Treating Adenike Shidler/Extender: Tito Dine in Treatment: 47 Wound Status Wound Number: 5 Primary Lymphedema Etiology: Wound Location: Left Lower Leg - Medial Wound Open Wounding Event: Gradually Appeared Status: Date Acquired: 11/19/2018 Comorbid Cataracts, Asthma, Sleep Apnea, Deep Vein Weeks Of Treatment: 47 History: Thrombosis, Hypertension, Peripheral Venous Clustered Wound: No Disease, Osteoarthritis, Received Chemotherapy, Received Radiation Photos Wound Measurements Length: (cm) 2.6 Width: (cm) 2.4 Depth: (cm) 0.2 Area: (cm) 4.901 Volume: (cm) 0.98 % Reduction in Area: 42.6% % Reduction in Volume: -14.6% Epithelialization: None Tunneling: No Undermining: No Wound Description Full Thickness Without Exposed Support Classification: Structures Wound Margin: Flat and Intact Exudate Large Amount: Exudate Type: Serosanguineous Exudate Color: red, brown Foul Odor After Cleansing: No Slough/Fibrino Yes Wound Bed Granulation Amount: Medium (34-66%) Exposed Structure Granulation Quality: Pale Fascia Exposed: No Necrotic Amount: Medium (34-66%) Fat Layer (Subcutaneous Tissue) Exposed: Yes Necrotic Quality: Adherent Slough Tendon Exposed: No Muscle Exposed: No Joint Exposed: No Bone Exposed: No Qadir, Rayden J.  ([id number]) Treatment Notes Wound #5 (Left, Medial Lower Leg) Notes Sorbact to all wounds, drawtex, 3-Layer left, unna to anchor. TCA peri Electronic Signature(s) Signed: 11/04/2019 2:59:41 PM By: Army Melia Entered By: Army Melia on 11/04/2019 13:02:29 BRIZZA, ZENISEK ([id number]) -------------------------------------------------------------------------------- Wound Assessment Details Patient Name: [name], [name]. Date of Service: 11/04/2019 12:45 PM Medical Record Number: [id number] Patient Account Number: [id number] Date of Birth/Sex: 1946/01/05 (73 y.o. F) Treating RN: Army Melia Primary Care Makeba Delcastillo: [name] Other Clinician: Referring Maksymilian Mabey: [name] Treating Shyteria Lewis/Extender: Tito Dine in Treatment: 47 Wound Status Wound Number: 6 Primary Venous Leg Ulcer Etiology: Wound Location: Left Lower Leg - Lateral Wound Open Wounding Event: Gradually Appeared Status: Date Acquired: 01/19/2019 Comorbid Cataracts, Asthma, Sleep Apnea, Deep Vein Weeks Of Treatment: 41 History: Thrombosis, Hypertension, Peripheral Venous Clustered Wound: No Disease, Osteoarthritis, Received Chemotherapy, Received Radiation Photos Wound Measurements Length: (cm) 4.5 Width: (cm) 3.5 Depth: (cm) 0.3 Area: (cm) 12.37 Volume: (cm) 3.711 % Reduction in Area: -5522.7% % Reduction in Volume: -16768.2% Epithelialization: None Tunneling: No Undermining: No Wound Description Full Thickness Without Exposed Support Classification: Structures Wound Margin: Flat and Intact Exudate Large Amount: Exudate Type: Serosanguineous Exudate Color: red, brown Foul Odor After Cleansing: No Slough/Fibrino Yes Wound Bed Granulation Amount: Medium (34-66%) Exposed Structure Granulation Quality: Pink Fascia Exposed: No Necrotic Amount: Medium (34-66%) Fat Layer (Subcutaneous Tissue) Exposed: Yes Necrotic Quality: Adherent Slough Tendon Exposed: No Muscle  Exposed: No Joint Exposed: No Bone Exposed: No Sampedro, Honesty J. ([id number]) Treatment Notes Wound #6 (Left, Lateral Lower Leg) Notes Sorbact to all wounds, drawtex, 3-Layer left, unna to anchor. TCA peri Electronic Signature(s) Signed: 11/04/2019 2:59:41 PM By: Army Melia Entered By: Army Melia on 11/04/2019 13:03:44 Pennel, Amber Child ([id number]) -------------------------------------------------------------------------------- Wound Assessment Details Patient Name: [name], [name]. Date of Service: 11/04/2019 12:45 PM Medical Record Number: [id number] Patient Account Number: [id number] Date of Birth/Sex: [birth year] (73 y.o. F)  Treating RN: Army Melia Primary Care Maisa Bedingfield: [name] Other Clinician: Referring Anetha Slagel: [name] Treating Noorah Giammona/Extender: Tito Dine in Treatment: 47 Wound Status Wound Number: 9 Primary Venous Leg Ulcer Etiology: Wound Location: Left Lower Leg - Lateral, Posterior Wound Open Wounding Event: Gradually Appeared Status: Date Acquired: 07/08/2019 Comorbid Cataracts, Asthma, Sleep Apnea, Deep Vein Weeks Of Treatment: 17 History: Thrombosis, Hypertension, Peripheral Venous Clustered Wound: No Disease, Osteoarthritis, Received Chemotherapy, Received Radiation Photos Wound Measurements Length: (cm) 1.2 Width: (cm) 0.8 Depth: (cm) 0.1 Area: (cm) 0.754 Volume: (cm) 0.075 % Reduction in Area: -33.5% % Reduction in Volume: -31.6% Epithelialization: None Wound Description Full Thickness Without Exposed Support Classification: Structures Wound Margin: Flat and Intact Exudate Large Amount: Exudate Type: Serosanguineous Exudate Color: red, brown Foul Odor After Cleansing: No Slough/Fibrino Yes Wound Bed Granulation Amount: Large (67-100%) Exposed Structure Granulation Quality: Pink Fascia Exposed: No Necrotic Amount: Small (1-33%) Fat Layer (Subcutaneous Tissue) Exposed: Yes Necrotic Quality: Adherent  Slough Tendon Exposed: No Muscle Exposed: No Joint Exposed: No Bone Exposed: No [name], Ysabelle J. ([id number]) Treatment Notes Wound #9 (Left, Lateral, Posterior Lower Leg) Notes Sorbact to all wounds, drawtex, 3-Layer left, unna to anchor. TCA peri Electronic Signature(s) Signed: 11/04/2019 2:59:41 PM By: Army Melia Entered By: Army Melia on 11/04/2019 13:05:06 WALESCA, PLUMLEE ([id number]) -------------------------------------------------------------------------------- Vitals Details Patient Name: [name], [name]. Date of Service: 11/04/2019 12:45 PM Medical Record Number: [id number] Patient Account Number: [id number] Date of Birth/Sex: 26-Mar-1946 (73 y.o. F) Treating RN: Army Melia Primary Care Laetitia Schnepf: [name] Other Clinician: Referring Ruston Fedora: [name] Treating Deeann Servidio/Extender: Tito Dine in Treatment: 47 Vital Signs Time Taken: 12:56 Temperature (F): 98.6 Height (in): 63 Pulse (bpm): 91 Weight (lbs): 224.7 Respiratory Rate (breaths/min): 16 Body Mass Index (BMI): 39.8 Blood Pressure (mmHg): 146/84 Reference Range: 80 - 120 mg / dl Electronic Signature(s) Signed: 11/04/2019 2:59:41 PM By: Army Melia Entered By: Army Melia on 11/04/2019 12:56:33

## 2019-11-05 NOTE — Progress Notes (Signed)
Amber, [name] ([id number]) Visit Report for 11/04/2019 HPI Details Patient Name: [name], [name]. Date of Service: 11/04/2019 12:45 PM Medical Record Number: [id number] Patient Account Number: [id number] Date of Birth/Sex: 1946-03-29 (73 y.o. F) Treating RN: Primary Care Provider: [name] Other Clinician: Referring Provider: [name] Treating Provider/Extender: Tito Dine in Treatment: 77 History of Present Illness HPI Description: Pleasant 73 year old with history of chronic venous insufficiency. No diabetes or peripheral vascular disease. Left ABI 1.29. Questionable history of left lower extremity DVT. She developed a recurrent ulceration on her left lateral calf in December 2015, which she attributes to poor diet and subsequent lower extremity edema. She underwent endovenous laser ablation of her left greater saphenous vein in 2010. She underwent laser ablation of accessory branch of left GSV in April 2016 by Dr. Kellie Simmering at [facility]. She was previously wearing Unna boots, which she tolerated well. Tolerating 2 layer compression and cadexomer iodine. She returns to clinic for follow-up and is without new complaints. She denies any significant pain at this time. She reports persistent pain with pressure. No claudication or ischemic rest pain. No fever or chills. No drainage. READMISSION 11/13/16; this is a 73 year old woman who is not a diabetic. She is here for a review of a painful area on her left medial lower extremity. I note that she was seen here previously last year for wound I believe to be in the same area. At that time she had undergone previously a left greater saphenous vein ablation by Dr. Kellie Simmering and she had a ablation of the anterior accessory branch of the left greater saphenous vein in March 2016. Seeing that the wound actually closed over. In reviewing the history with her today the ulcer in this area has been recurrent. She describes a  biopsy of this area in 2009 that only showed stasis physiology. 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 Do not see that she has ever had arterial studies in the left leg. She tells me she has a follow-up with Dr. Kellie Simmering in roughly 10 days In any case she developed the reopening of this area roughly a month ago. On the background of this she describes rapidly increasing edema which has responded to Lasix 40 mg and metolazone 2.5 mg as well as the patient's lymph massage. She has been told she has both venous insufficiency and lymphedema but she cannot tolerate compression stockings 11/28/16; the patient saw Dr. Kellie Simmering recently. Per the patient he did arterial Dopplers in the office that did not show evidence of arterial insufficiency, per the patient he stated "treat this like an ordinary venous ulcer". She also saw her dermatologist Dr. Ronnald Ramp who felt that this was more of a vascular ulcer. In general things are improving although she arrives today with increasing bilateral lower extremity edema with weeping a deeper fluid through the wound on the left medial leg compatible with some degree of lymphedema 12/04/16; the patient's wound is fully epithelialized but I don't think fully healed. We will do another week of depression with Promogran and TCA however I suspect we'll be able to discharge her next week. This is a very unusual-looking wound which was initially a figure-of-eight type wound lying on its side surrounded by petechial like hemorrhage. She has had  venous ablation on this side. She apparently does not have an arterial issue per Dr. Kellie Simmering. She saw her dermatologist thought it was "vascular". Patient is definitely going to need ongoing compression and I talked about  this with her today she will go to elastic therapy after she leaves here next week 12/11/16; the patient's wound is not completely closed today. She has surrounding scar tissue and in further discussion with the patient it would appear that she had ulcers in this area in 2009 for a prolonged period of time ultimately requiring a punch biopsy of this area that only showed venous insufficiency. I did not previously pickup on this part of the history from the patient. 12/18/16; the patient's wound is completely epithelialized. There is no open area here. She has significant bilateral venous insufficiency with secondary lymphedema to a mild-to-moderate degree she does not have compression stockings.. She did not say anything to me when I was in the room, she told our intake nurse that she was still having pain in this area. This isn't Amber, [name] ([id number]) unusual recurrent small open area. She is going to go to elastic therapy to obtain compression stockings. 12/25/16; the patient's wound is fully epithelialized. There is no open area here. The patient describes some continued episodic discomfort in this area medial left calf. However everything looks fine and healed here. She is been to elastic therapy and caught herself 15-20 mmHg stockings, they apparently were having trouble getting 20-30 mm stockings in her size 01/22/17; this is a patient we discharged from the clinic a month ago. She has a recurrent open wound on her medial left calf. She had 15 mm support stockings. I told her I thought she needed 20-30 mm compression stockings. She tells me that she has been ill with hospitalization secondary to asthma and is been found to have severe hypokalemia likely secondary to a combination of Lasix and metolazone. This morning she noted blistering and leaking fluid on the posterior part of her left leg. She called our intake nurse urgently and we was saw her this afternoon. She has not had any real  discomfort here. I don't know that she's been wearing any stockings on this leg for at least 2-3 days. ABIs in this clinic were 1.21 on the right and 1.3 on the left. She is previously seen vascular surgery who does not think that there is a peripheral arterial issue. 01/30/17; Patient arrives with no open wound on the left leg. She has been to elastic therapy and obtained 20-74mmhg below knee stockings and she has one on the right leg today. READMISSION 02/19/18; this Paladino is a now 73 year old patient we've had in this clinic perhaps 3 times before. I had last looked at her from January 07 December 2016 with an area on the medial left leg. We discharged her on 12/25/16 however she had to be readmitted on 01/22/17 with a recurrence. I have in my notes that we discharged her on 20-30 mm stockings although she tells me she was only wearing support hose because she cannot get stockings on predominantly related to her cervical spine surgery/issues. She has had previous ablations done by vein and vascular in [place] including a great saphenous vein ablation on the left with an anterior accessory branch ablation I think both of these were in 2016. On one of the previous visit she had a biopsy noted 2009 that was negative. She is not felt to have an arterial issue. She is not a diabetic.  She does have a history of obstructive sleep apnea hypertension asthma as well as chronic venous insufficiency and lymphedema. On this occasion she noted 2 dry scaly patch on her left leg. She tried to put lotion on this it didn't really help. There were 2 open areas.the patient has been seeing her primary physician from 02/05/18 through 02/14/18. She had Unna boots applied. The superior wound now on the lateral left leg has closed but she's had one wound that remains open on the lateral left leg. This is not the same spot as we dealt with in 2018. ABIs in this clinic were 1.3 bilaterally 02/26/18; patient has a small wound  on the left lateral calf. Dimensions are down. She has chronic venous insufficiency and lymphedema. 03/05/18; small open area on the left lateral calf. Dimensions are down. Tightly adherent necrotic debris over the surface of the wound which was difficult to remove. Also the dressing [over collagen] stuck to the wound surface. This was removed with some difficulty as well. Change the primary dressing to Hydrofera Blue ready 03/12/18; small open area on the left lateral calf. Comes in with tightly adherent surface eschar as well as some adherent Hydrofera Blue. 03/19/18; open area on the left lateral calf. Again adherent surface eschar as well as some adherent Hydrofera Blue nonviable subcutaneous tissue. She complained of pain all week even with the reduction from 4-3 layer compression I put on last week. Also she had an increase in her ankle and calf measurements probably related to the same thing. 03/26/18; open area on the left lateral calf. A very small open area remains here. We used silver alginate starting last week as the Hydrofera Blue seem to stick to the wound bed. In using 4-layer compression 04/02/18; the open area in the left lateral calf at some adherent slough which I removed there is no open area here. We are able to transition her into her own compression stocking. Truthfully I think this is probably his support hose. However this does not maintain skin integrity will be limited. She cannot put over the toe compression stockings on because of neck problems hand problems etc. She is allergic to the lining layer of juxta lites. We might be forced to use extremitease stocking should this fail READMIT 11/24/2018 Patient is now a 73 year old woman who is not a diabetic. She has been in this clinic on at least 3 previous occasions largely with recurrent wounds on her left leg secondary to chronic venous insufficiency with secondary lymphedema. Her situation is complicated by inability to get  stockings on and an allergy to neoprene which is apparently a component and at least juxta lites and other stockings. As a result she really has not been wearing any stockings on her legs. She tells [place] that roughly 2 or 3 weeks ago she started noticing a stinging sensation just above her ankle on the left medial aspect. She has been diagnosed with pseudogout and she wondered whether this was what she was experiencing. She tried to dress this with something she bought at the store however subsequently it pulled skin off and now she has an open wound that is not improving. She has been using Vaseline gauze with a cover bandage. She saw her primary doctor last week who DALIYAH, WIDGER ([id number]) put an Unna boot on her. ABIs in this clinic was 1.03 on the left 2/12; the area is on the left medial ankle. Odd-looking wound with what looks to be surface epithelialization but a  multitude of small petechial openings. This clearly not closed yet. We have been using silver alginate under 3 layer compression with TCA 2/19; the wound area did not look quite as good this week. Necrotic debris over the majority of the wound surface which required debridement. She continues to have a multitude of what looked to be small petechial openings. She reminds [place] that she had a biopsy on this initially during her first outbreak in 2015 in [name] [facility]. She expresses concern about this being a possible melanoma. 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 I assured her that this does not look anything like melanoma. Except for the petechial reaction it does look like a venous insufficiency area and she certainly has evidence of this on both sides 2/26; a difficult area on the left medial ankle. The patient clearly has chronic venous hypertension with some degree of lymphedema. The odd thing about the area is the small  petechial hemorrhages. I am not really sure how to explain this. This was present last time and this is not a compression injury. We have been using Hydrofera Blue which I changed to last week 3/4; still using Hydrofera Blue. Aggressive debridement today. She does not have known arterial issues. She has seen Dr. Kellie Simmering at [facility] vascular and and has an ablation on the left. [Anterior accessory branch of the greater saphenous]. From what I remember they did not feel she had an arterial issue. The patient has had this area biopsied in 2009 at [facility] dermatology and by her recollection they said this was "stasis". She is also follow-up with dermatology locally who thought that this was more of a vascular issue 3/11; using Hydrofera Blue. Aggressive debridement today. She does not have an arterial issue. We are using 3 layer compression although we may need to go to 4. The patient has been in for multiple changes to her wrap since I last saw her a week ago. She says that the area was leaking. I do not have too much more information on what was found 01/19/19 on evaluation today patient was actually being seen for a nurse visit when unfortunately she had the area on her left lateral lower extremity as well as weeping from the right lower extremity that became apparent. Therefore we did end up actually seeing her for a full visit with myself. She is having some pain at this site as well but fortunately nothing too significant at this point. No fevers, chills, nausea, or vomiting noted at this time. 3/18-Patient is back to the clinic with the left leg venous leg ulcer, the ulcer is larger in size, has a surface that is densely adherent with fibrinous tissue, the Hydrofera Blue was used but is densely adherent and there was difficulty in removing it. The right lower extremity was also wrapped for weeping edema. Patient has a new area over the left lateral foot above the malleolus that is small  and appears to have no debris with intact surrounding skin. Patient is on increased dose of Lasix also as a means to edema management 3/25; the patient has a nonhealing venous ulcer on the medial left leg and last week developed a smaller area on the lateral left calf. We have been using Hydrofera Blue with a contact layer. 4/1; no major change in these wounds areas. Left medial and more recently left lateral calf. I tried Iodoflex last week to aid  in debridement she did not tolerate this. She stated her pain was terrible all week. She took the top layer of the 4 layer compression off. 4/8; the patient actually looks somewhat better in terms of her more prominent left lateral calf wound. There is some healthy looking tissue here. She is still complaining of a lot of discomfort. 4/15; patient in a lot of pain secondary to sciatica. She is on a prednisone taper prescribed by her primary physician. She has the 2 areas one on the left medial and more recently a smaller area on the left lateral calf. Both of these just above the malleoli 4/22; her back pain is better but she still states she is very uncomfortable and now feels she is intolerant to the [employer]. No real change in the wounds we have been using Sorbact. She has been previously intolerant to Iodoflex. There is not a lot of option about what we can use to debride this wound under compression that she no doubt needs. sHe states Ultram no longer works for her pain 4/29; no major change in the wounds slightly increased depth. Surface on the original medial wound perhaps somewhat improved however the more recent area on the lateral left ankle is 100% covered in very adherent debris we have been using Sorbact. She tolerates 4 layer compression well and her edema control is a lot better. She has not had to come in for a nurse check 5/6; no major change in the condition of the wounds. She did consent to debridement today which was done with  some difficulty. Continuing Sorbact. She did not tolerate Iodoflex. She was in for a check of her compression the day after we wrapped her last week this was adjusted but nothing much was found 5/13; no major change in the condition or area of the wounds. I was able to get a fairly aggressive debridement done on the lateral left leg wound. Even using Sorbact under compression. She came back in on Friday to have the wrap changed. She says she felt uncomfortable on the lateral aspect of her ankle. She has a long history of chronic venous insufficiency including previous ablation surgery on this side. 5/20-Patient returns for wounds on left leg with both wounds covered in slough, with the lateral leg wound larger in size, she has been in 3 layer compression and felt more comfortable, she describes pain in ankle, in leg and pins and needles in foot, Seward, Taejah J. ([id number]) and is about to try Pamelor for this 6/3; wounds on the left lateral and left medial leg. The area medially which is the most recent of the 2 seems to have had the largest increase in dimensions. We have been using Sorbac to try and debride the surface. She has been to see orthopedics they apparently did a plain x-ray that was indeterminant. Diagnosed her with neuropathy and they have ordered an MRI to determine if there is underlying osteomyelitis. This was not high on my thought list but I suppose it is prudent. We have advised her to make an appointment with vein and vascular in [place]. She has a history of a left greater saphenous and accessory vein ablations I wonder if there is anything else that can be done from a surgical point of view to help in these difficult refractory wounds. We have previously healed this wound on one occasion but it keeps on reopening [medial side] 6/10; deep tissue culture I did last week I think on the left medial  wound showed both moderate E. coli and moderate staph aureus [MSSA]. She is  going to require antibiotics and I have chosen Augmentin. We have been using Sorbact and we have made better looking wound surface on both sides but certainly no improvement in wound area. She was back in last Friday apparently for a dressing changes the wrap was hurting her outer left ankle. She has not managed to get a hold of vein and vascular in [place]. We are going to have to make her that appointment 6/17; patient is tolerating the Augmentin. She had an MRI that I think was ordered by orthopedic surgeon this did not show osteomyelitis or an abscess did suggest cellulitis. We have been using Sorbact to the lateral and medial ankles. We have been trying to arrange a follow-up appointment with vein and vascular in [place] or did her original ablations. We apparently an area sent the request to vein and vascular in [facility] 6/24; patient has completed the Augmentin. We do not yet have a vein and vascular appointment in [place]. I am not sure what the issue is here we have asked her to call tomorrow. We are using Sorbact. Making some improvements and especially the medial wound. Both surfaces however look better medial and lateral. 7/1; the patient has been in contact with vein and vascular in [place] but has not yet received an appointment. Using Sorbact we have gradually improve the wound surface with no improvement in surface area. She is approved for Apligraf but the wound surface still is not completely viable. She has not had to come in for a dressing change 7/8; the patient has an appointment with vein and vascular on 7/31 which is a Friday afternoon. She is concerned about getting back here for [place] to dress her wounds. I think it is important to have them goal for her venous reflux/history of ablations etc. to see if anything else can be done. She apparently tested positive for 1 of the blood tests with regards to lupus and saw a rheumatologist. He has raised the issue of  vasculitis again. I have had this thought in the past however the evidence seems overwhelming that this is a venous reflux etiology. If the rheumatologist tells me there is clinical and laboratory investigation is positive for lupus I will rethink this. 7/15; the patient's wound surfaces are quite a bit better. The medial area which was her original wound now has no depth although the lateral wound which was the more recent area actually appears larger. Both with viable surfaces which is indeed better. Using Sorbact. I wanted to use Apligraf on her however there is the issue of the vein and vascular appointment on 7/31 at 2:00 in the afternoon which would not allow her to get back to be rewrapped and they would no doubt remove the graft 7/22; the patient's wound surfaces have moderate amount of debris although generally look better. The lateral one is larger with 2 small satellite areas superiorly. We are waiting for her vein and vascular appointment on 7/31. She has been approved for Apligraf which I would like to use after th 7/29; wound surfaces have improved no debridement is required we have been using Sorbact. She sees vein and vascular on Friday with this so question of whether anything can be done to lessen the likelihood of recurrence and/or speed the healing of these areas. She is already had previous ablations. She no doubt has severe venous hypertension 8/5-Patient returns at 1 week, she was in  Unna boot for 3 days by her podiatrist, we have been using so backed to the wound, she has increased pain in both the wounds on the left lower leg especially the more distal one on the lateral aspect 8/12-Patient returns at 1 week and she is agreeable to having debridement in both wounds on her left leg today. We have been using Sorbact, and vascular studies were reviewed at last visit 8/19; the patient arrives with her wounds fairly clean and no debridement is required. We have used Sorbact which  is really done a nice job in cleaning up these very difficult wound surfaces. The patient saw Dr. Donzetta Matters of vascular surgery on 7/31. He did not feel that there was an arterial component. He felt that her treated greater saphenous vein is adequately addressed and that the small saphenous vein did not appear to be involved significantly. She was also noted to have deep venous reflux which is not treatable. Dr. Donzetta Matters mentioned the possibility of a central obstructive component leading to reflux and he offered her central venography. She wanted to discuss this or think about it. I have urged her to go ahead with this. She has had recurrent difficult wounds in these areas which do heal but after months in the clinic. If there is anything that can be done to reduce the likelihood of this I think it is worth it. 9/2 she is still working towards getting follow-up with Dr. Donzetta Matters to schedule her CT. Things are quite a bit worse venography. I put Apligraf on 2 weeks ago on both wounds on the medial and lateral part of her left lower leg. She arrives in clinic today with 3 superficial additional wounds above the area laterally and one below the wound medially. She describes a lot of discomfort. I think these are probably wrapped injuries. Does not look like she has cellulitis. 07/20/2019 on evaluation today patient appears to be doing somewhat poorly in regard to her lower extremity ulcers. She in fact showed signs of erythema in fact we may even be dealing with an infection at this time. Unfortunately I am unsure if this EVERLEE, KEITER ([id number]) is just infection or if indeed there may be some allergic reaction that occurred as a result of the Apligraf application. With that being said that would be unusual but nonetheless not impossible in this patient is one who is unfortunately allergic to quite a bit. Currently we have been using the Sorbact which seems to do as well as anything for her. I do think we may  want to obtain a culture today to see if there is anything showing up there that may need to be addressed. 9/16; noted that last week the wounds look worse in 1 week follow-up of the Apligraf. Using Sorbact as of 2 days ago. She arrives with copious amounts of drainage and new skin breakdown on the back of the left calf. The wounds arm more substantial bilaterally. There is a fair amount of swelling in the left calf no overt DVT there is edema present I think in the left greater than right thigh. She is supposed to go on 9/28 for CT venography. The wounds on the medial and lateral calf are worse and she has new skin breakdown posteriorly at least new for me. This is almost developing into a circumferential wound area The Apligraf was taken off last week which I agree with things are not going in the right direction a culture was done we do not  have that back yet. She is on Augmentin that she started 2 days ago 9/23; dressing was changed by her nurses on Monday. In general there is no improvement in the wound areas although the area looks less angry than last week. She did get Augmentin for MSSA cultured on the 14th. She still appears to have too much swelling in the left leg even with 3 layer compression 9/30; the patient underwent her procedure on 9/28 by Dr. Donzetta [facility]ters [facility]. She was discovered to have the common iliac vein measuring 12.2 mm but at the level of L4-L5 measured 3 mm. After stenting it measured 10 mm. 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 Rouleaux flow in the common femoral and femoral vein was observed much improved after stenting. We are using silver alginate to the wounds on the medial and lateral ankle on the left. 4 layer compression 10/7; the patient had fluid swelling around her knee and 4 layer compression. At the advice of vein and vascular this was reduced to 3 layer which she is tolerating better. We have been using silver  alginate under 3 layer compression since last Friday 10/14; arrives with the areas on the left ankle looking a lot better. Inflammation in the area also a lot better. She came in for a nurse check on 10/9 10/21; continued nice improvement. Slight improvements in surface area of both the medial and lateral wounds on the left. A lot of the satellite lesions in the weeping erythema around these from stasis dermatitis is resolved. We have been using silver alginate 10/28; general improvement in the entire wound areas although not a lot of change in dimensions the wound certainly looks better. There is a lot less in terms of venous inflammation. Continue silver alginate this week however look towards Hydrofera Blue next week 11/4; very adherent debris on the medial wound left wound is not as bad. We have been using silver alginate. Change to [facility] today 11/11; very adherent debris on both wound areas. She went to vein and vascular last week and follow-up they put in [place] boot on this today. He says the [facility] was adherent. Wound is definitely not as good as last week. Especially on the left there the satellite lesions look more prominent 11/18; absolutely no better. erythema on lateral aspect with tenderness. 09/30/2019 on evaluation today patient appears to actually be doing better. Dr. Dellia Nims did put her on doxycycline last week which I do believe has helped her at this point. Fortunately there is no signs of active infection at this time. No fevers, chills, nausea, vomiting, or diarrhea. I do believe he may want extend the doxycycline for 7 additional days just to ensure everything does completely cleared up the patient is in agreement with that plan. Otherwise she is going require some sharp debridement today 12/2; patient is completing a 2-week course of doxycycline. I gave her this empirically for inflammation as well as infection when I last saw her 2 weeks ago. All of this  seems to be better. She is using silver alginate she has the area on the medial aspect of the larger area laterally and the 2 small satellite regions laterally above the major wound. 12/9; the patient's wound on the left medial and left lateral calf look really quite good. We have been using silver alginate. She saw vein and vascular in follow-up on 10/09/2019. She has had a previous left greater saphenous vein ablation by Dr. Oscar La  in 2016. More recently she underwent a left common iliac vein stent by Dr. 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 The swelling is improved and certainly the wounds have improved. The patient shows [place] today area on the right medial calf there is almost no wound but leaking lymphedema. She says she start this started 3 or 4 days ago. She did not traumatize it. It is not painful. She does not wear compression on that side 12/16; the patient continues to do well laterally. Medially still requiring debridement. The area on the right calf did not materialize to anything and is not currently open. We wrapped this last time. She has support stockings for that leg although I am not sure they are going to provide adequate compression 12/23; the lateral wound looks stable. Medially still requiring debridement for tightly adherent fibrinous debris. We've been YATZIRI, ROBENSON ([id number]) using silver alginate. Surface area not any different 12/30; neither wound is any better with regards to surface and the area on the left lateral is larger. I been using silver alginate to the left lateral which look quite good last week and Sorbact to the left medial Electronic Signature(s) Signed: 11/04/2019 4:48:37 PM By: Linton Ham MD Entered By: Linton Ham on 11/04/2019 13:13:38 Mcnew, [name] ([id number]) -------------------------------------------------------------------------------- Physical Exam Details Patient Name: DINITA, HEINEY. Date of Service: 11/04/2019  12:45 PM Medical Record Number: [id number] Patient Account Number: [id number] Date of Birth/Sex: 08/20/1946 (73 y.o. F) Treating RN: Primary Care Provider: [name] Other Clinician: Referring Provider: [name] Treating Provider/Extender: Tito Dine in Treatment: 44 Constitutional Patient is hypertensive.. Pulse regular and within target range for patient.Marland Kitchen Respirations regular, non-labored and within target range.. Temperature is normal and within the target range for the patient.Marland Kitchen appears in no distress. Respiratory Respiratory effort is easy and symmetric bilaterally. Rate is normal at rest and on room air.. Cardiovascular Pedal pulses are palpable in the left foot although reduced. Edema control is quite good. Integumentary (Hair, Skin) No erythema around the wound. Psychiatric No evidence of depression, anxiety, or agitation. Calm, cooperative, and communicative. Appropriate interactions and affect.. Electronic Signature(s) Signed: 11/04/2019 4:48:37 PM By: Linton Ham MD Entered By: Linton Ham on 11/04/2019 13:15:24 [name] ([id number]) -------------------------------------------------------------------------------- Physician Orders Details Patient Name: KORBYN, KOZLOFF. Date of Service: 11/04/2019 12:45 PM Medical Record Number: [id number] Patient Account Number: [id number] Date of Birth/Sex: November 05, 1946 (73 y.o. F) Treating RN: Harold Barban Primary Care Provider: [name] Other Clinician: Referring Provider: [name] Treating Provider/Extender: Tito Dine in Treatment: 56 Verbal / Phone Orders: No Diagnosis Coding Wound Cleansing Wound #5 Left,Medial Lower Leg o Cleanse wound with mild soap and water Wound #6 Left,Lateral Lower Leg o Cleanse wound with mild soap and water Wound #9 Left,Lateral,Posterior Lower Leg o Cleanse wound with mild soap and water Anesthetic (add to Medication  List) Wound #5 Left,Medial Lower Leg o Topical Lidocaine 4% cream applied to wound bed prior to debridement (In Clinic Only). Wound #6 Left,Lateral Lower Leg o Topical Lidocaine 4% cream applied to wound bed prior to debridement (In Clinic Only). Wound #9 Left,Lateral,Posterior Lower Leg o Topical Lidocaine 4% cream applied to wound bed prior to debridement (In Clinic Only). Skin Barriers/Peri-Wound Care Wound #5 Left,Medial Lower Leg o Triamcinolone Acetonide Ointment (TCA) Wound #6 Left,Lateral Lower Leg o Triamcinolone Acetonide Ointment (TCA) Wound #9 Left,Lateral,Posterior Lower Leg o Triamcinolone Acetonide Ointment (TCA) Primary Wound Dressing Wound #5 Left,Medial Lower Leg o Other: - sorbact  Wound #6 Left,Lateral Lower Leg o Other: - sorbact Wound #9 Left,Lateral,Posterior Lower Leg o Other: - sorbact Secondary Dressing Wound #5 Left,Medial Lower Leg o Drawtex KANG, LESKO. ([id number]) Wound #6 Left,Lateral Lower Leg o Drawtex Wound #9 Left,Lateral,Posterior Lower Leg o Drawtex Dressing Change Frequency Wound #5 Left,Medial Lower Leg o Change dressing every week o Other: - as needed. Wound #6 Left,Lateral Lower Leg o Change dressing every week o Other: - as needed. Wound #9 Left,Lateral,Posterior Lower Leg o Change dressing every week o Other: - as needed. Follow-up Appointments Wound #5 Left,Medial Lower Leg o Return Appointment in 1 week. o Nurse Visit as needed - Call of needed Wound #6 Left,Lateral Lower Leg o Return Appointment in 1 week. o Nurse Visit as needed - Call of needed Wound #9 Left,Lateral,Posterior Lower Leg o Return Appointment in 1 week. o Nurse Visit as needed - Call of needed Edema Control Wound #5 Left,Medial Lower Leg o 3 Layer Compression System - Left Lower Extremity - unna to anchor Wound #6 Left,Lateral Lower Leg o 3 Layer Compression System - Left Lower Extremity - unna to  anchor Wound #9 Left,Lateral,Posterior Lower Leg o 3 Layer Compression System - Left Lower Extremity - unna to anchor Electronic Signature(s) Signed: 11/04/2019 3:13:29 PM By: Harold Barban Signed: 11/04/2019 4:48:37 PM By: Linton Ham MD Entered By: Harold Barban on 11/04/2019 13:11:38 Madariaga, [name] -------------------------------------------------------------------------------- Problem List Details Patient Name: AKACIA, UHER. Date of Service: 11/04/2019 12:45 PM Medical Record Number: [id number] Patient Account Number: [id number] Date of Birth/Sex: [birth year] (73 y.o. F) Treating RN: Primary Care Provider: [name] Other Clinician: Referring Provider: [name] Treating Provider/Extender: Tito Dine in Treatment: 54 Active Problems ICD-10 Evaluated Encounter Code Description Active Date Today Diagnosis L97.221 Non-pressure chronic ulcer of left calf limited to breakdown of 01/07/2019 No Yes skin I87.321 Chronic venous hypertension (idiopathic) with inflammation of 12/10/2018 No Yes right lower extremity I89.0 Lymphedema, not elsewhere classified 12/10/2018 No Yes L97.818 Non-pressure chronic ulcer of other part of right lower leg 10/14/2019 No Yes with other specified severity Inactive Problems Resolved Problems Electronic Signature(s) Signed: 11/04/2019 4:48:37 PM By: Linton Ham MD Entered By: Linton Ham on 11/04/2019 13:12:53 Cogar, [name] -------------------------------------------------------------------------------- Progress Note Details Patient Name: KULSOOM, BYSTROM. Date of Service: 11/04/2019 12:45 PM Medical Record Number: [id number] Patient Account Number: [id number] Date of Birth/Sex: 09-Sep-1946 (73 y.o. F) Treating RN: Primary Care Provider: [name] Other Clinician: Referring Provider: [name] Treating Provider/Extender: Tito Dine in Treatment:  83 Subjective History of Present Illness (HPI) Pleasant 73 year old with history of chronic venous insufficiency. No diabetes or peripheral vascular disease. Left ABI 1.29. Questionable history of left lower extremity DVT. She developed a recurrent ulceration on her left lateral calf in December 2015, which she attributes to poor diet and subsequent lower extremity edema. She underwent endovenous laser ablation of her left greater saphenous vein in 2010. She underwent laser ablation of accessory branch of left GSV in April 2016 by Dr. Kellie Simmering at [facility]. She was previously wearing Unna boots, which she tolerated well. Tolerating 2 layer compression and cadexomer iodine. She returns to clinic for follow-up and is without new complaints. She denies any significant pain at this time. She reports persistent pain with pressure. No claudication or ischemic rest pain. No fever or chills. No drainage. READMISSION 11/13/16; this is a 73 year old woman who is not a diabetic. She is here for a review of a painful area on her  left medial lower extremity. I note that she was seen here previously last year for wound I believe to be in the same area. At that time she had undergone previously a left greater saphenous vein ablation by Dr. Kellie Simmering and she had a ablation of the anterior accessory branch of the left greater saphenous vein in March 2016. Seeing that the wound actually closed over. In reviewing the history with her today the ulcer in this area has been recurrent. She describes a biopsy of this area in 2009 that only showed stasis physiology. 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 Do not see that she has ever had arterial studies in the left leg. She tells me she has a  follow-up with Dr. Kellie Simmering in roughly 10 days In any case she developed the reopening of this area roughly a month ago. On the background of this she describes rapidly increasing edema which has responded to Lasix 40 mg and metolazone 2.5 mg as well as the patient's lymph massage. She has been told she has both venous insufficiency and lymphedema but she cannot tolerate compression stockings 11/28/16; the patient saw Dr. Kellie Simmering recently. Per the patient he did arterial Dopplers in the office that did not show evidence of arterial insufficiency, per the patient he stated "treat this like an ordinary venous ulcer". She also saw her dermatologist Dr. Ronnald Ramp who felt that this was more of a vascular ulcer. In general things are improving although she arrives today with increasing bilateral lower extremity edema with weeping a deeper fluid through the wound on the left medial leg compatible with some degree of lymphedema 12/04/16; the patient's wound is fully epithelialized but I don't think fully healed. We will do another week of depression with Promogran and TCA however I suspect we'll be able to discharge her next week. This is a very unusual-looking wound which was initially a figure-of-eight type wound lying on its side surrounded by petechial like hemorrhage. She has had venous ablation on this side. She apparently does not have an arterial issue per Dr. Kellie Simmering. She saw her dermatologist thought it was "vascular". Patient is definitely going to need ongoing compression and I talked about this with her today she will go to elastic therapy after she leaves here next week 12/11/16; the patient's wound is not completely closed today. She has surrounding scar tissue and in further discussion with the patient it would appear that she had ulcers in this area in 2009 for a prolonged period of time ultimately requiring a punch biopsy of this area that only showed venous insufficiency. I did not previously  pickup on this part of the history from the patient. 12/18/16; the patient's wound is completely epithelialized. There is no open area here. She has significant bilateral venous insufficiency with secondary lymphedema to a mild-to-moderate degree she does not have compression stockings.. She did not say anything to me when I was in the room, she told our intake nurse that she was still having pain in this area. This isn't unusual recurrent small open area. She is going to go to elastic therapy to obtain compression stockings. 12/25/16; the patient's wound is fully epithelialized. There is no open area here. The patient describes some continued episodic discomfort in this area medial left  calf. However everything looks fine and healed here. She is been to elastic therapy and MONALISA, GOOS ([id number]) caught herself 15-20 mmHg stockings, they apparently were having trouble getting 20-30 mm stockings in her size 01/22/17; this is a patient we discharged from the clinic a month ago. She has a recurrent open wound on her medial left calf. She had 15 mm support stockings. I told her I thought she needed 20-30 mm compression stockings. She tells me that she has been ill with hospitalization secondary to asthma and is been found to have severe hypokalemia likely secondary to a combination of Lasix and metolazone. This morning she noted blistering and leaking fluid on the posterior part of her left leg. She called our intake nurse urgently and we was saw her this afternoon. She has not had any real discomfort here. I don't know that she's been wearing any stockings on this leg for at least 2-3 days. ABIs in this clinic were 1.21 on the right and 1.3 on the left. She is previously seen vascular surgery who does not think that there is a peripheral arterial issue. 01/30/17; Patient arrives with no open wound on the left leg. She has been to elastic therapy and obtained 20-51mmhg below knee stockings and she  has one on the right leg today. READMISSION 02/19/18; this Winegar is a now 73 year old patient we've had in this clinic perhaps 3 times before. I had last looked at her from January 07 December 2016 with an area on the medial left leg. We discharged her on 12/25/16 however she had to be readmitted on 01/22/17 with a recurrence. I have in my notes that we discharged her on 20-30 mm stockings although she tells me she was only wearing support hose because she cannot get stockings on predominantly related to her cervical spine surgery/issues. She has had previous ablations done by vein and vascular in [place] including a great saphenous vein ablation on the left with an anterior accessory branch ablation I think both of these were in 2016. On one of the previous visit she had a biopsy noted 2009 that was negative. She is not felt to have an arterial issue. She is not a diabetic. She does have a history of obstructive sleep apnea hypertension asthma as well as chronic venous insufficiency and lymphedema. On this occasion she noted 2 dry scaly patch on her left leg. She tried to put lotion on this it didn't really help. There were 2 open areas.the patient has been seeing her primary physician from 02/05/18 through 02/14/18. She had Unna boots applied. The superior wound now on the lateral left leg has closed but she's had one wound that remains open on the lateral left leg. This is not the same spot as we dealt with in 2018. ABIs in this clinic were 1.3 bilaterally 02/26/18; patient has a small wound on the left lateral calf. Dimensions are down. She has chronic venous insufficiency and lymphedema. 03/05/18; small open area on the left lateral calf. Dimensions are down. Tightly adherent necrotic debris over the surface of the wound which was difficult to remove. Also the dressing [over collagen] stuck to the wound surface. This was removed with some difficulty as well. Change the primary dressing to Hydrofera  Blue ready 03/12/18; small open area on the left lateral calf. Comes in with tightly adherent surface eschar as well as some adherent Hydrofera Blue. 03/19/18; open area on the left lateral calf. Again adherent surface eschar as well as some adherent  Hydrofera Blue nonviable subcutaneous tissue. She complained of pain all week even with the reduction from 4-3 layer compression I put on last week. Also she had an increase in her ankle and calf measurements probably related to the same thing. 03/26/18; open area on the left lateral calf. A very small open area remains here. We used silver alginate starting last week as the Hydrofera Blue seem to stick to the wound bed. In using 4-layer compression 04/02/18; the open area in the left lateral calf at some adherent slough which I removed there is no open area here. We are able to transition her into her own compression stocking. Truthfully I think this is probably his support hose. However this does not maintain skin integrity will be limited. She cannot put over the toe compression stockings on because of neck problems hand problems etc. She is allergic to the lining layer of juxta lites. We might be forced to use extremitease stocking should this fail READMIT 11/24/2018 Patient is now a 73 year old woman who is not a diabetic. She has been in this clinic on at least 3 previous occasions largely with recurrent wounds on her left leg secondary to chronic venous insufficiency with secondary lymphedema. Her situation is complicated by inability to get stockings on and an allergy to neoprene which is apparently a component and at least juxta lites and other stockings. As a result she really has not been wearing any stockings on her legs. She tells [place] that roughly 2 or 3 weeks ago she started noticing a stinging sensation just above her ankle on the left medial aspect. She has been diagnosed with pseudogout and she wondered whether this was what she was  experiencing. She tried to dress this with something she bought at the store however subsequently it pulled skin off and now she has an open wound that is not improving. She has been using Vaseline gauze with a cover bandage. She saw her primary doctor last week who put an Haematologist on her. ABIs in this clinic was 1.03 on the left SHAQUNA, GONZAGA. ([id number]) 2/12; the area is on the left medial ankle. Odd-looking wound with what looks to be surface epithelialization but a multitude of small petechial openings. This clearly not closed yet. We have been using silver alginate under 3 layer compression with TCA 2/19; the wound area did not look quite as good this week. Necrotic debris over the majority of the wound surface which required debridement. She continues to have a multitude of what looked to be small petechial openings. She reminds [place] that she had a biopsy on this initially during her first outbreak in 2015 in [place] [facility]. She expresses concern about this being a possible melanoma. 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 I assured her that this does not look anything like melanoma. Except for the petechial reaction it does look like a venous insufficiency area and she certainly has evidence of this on both sides 2/26; a difficult area on the left medial ankle. The patient clearly has chronic venous hypertension with some degree of lymphedema. The odd thing about the area is the small petechial hemorrhages. I am not really sure how to explain this. This was present last time and this is not a compression injury. We have been using Hydrofera Blue which I changed to last week 3/4; still using Hydrofera Blue. Aggressive debridement today. She does not have known  arterial issues. She has seen Dr. Kellie Simmering at [facility] vein and vascular and and has an ablation on the left. [Anterior accessory  branch of the greater saphenous]. From what I remember they did not feel she had an arterial issue. The patient has had this area biopsied in 2009 at [facility] dermatology and by her recollection they said this was "stasis". She is also follow-up with dermatology locally who thought that this was more of a vascular issue 3/11; using Hydrofera Blue. Aggressive debridement today. She does not have an arterial issue. We are using 3 layer compression although we may need to go to 4. The patient has been in for multiple changes to her wrap since I last saw her a week ago. She says that the area was leaking. I do not have too much more information on what was found 01/19/19 on evaluation today patient was actually being seen for a nurse visit when unfortunately she had the area on her left lateral lower extremity as well as weeping from the right lower extremity that became apparent. Therefore we did end up actually seeing her for a full visit with myself. She is having some pain at this site as well but fortunately nothing too significant at this point. No fevers, chills, nausea, or vomiting noted at this time. 3/18-Patient is back to the clinic with the left leg venous leg ulcer, the ulcer is larger in size, has a surface that is densely adherent with fibrinous tissue, the Hydrofera Blue was used but is densely adherent and there was difficulty in removing it. The right lower extremity was also wrapped for weeping edema. Patient has a new area over the left lateral foot above the malleolus that is small and appears to have no debris with intact surrounding skin. Patient is on increased dose of Lasix also as a means to edema management 3/25; the patient has a nonhealing venous ulcer on the medial left leg and last week developed a smaller area on the lateral left calf. We have been using Hydrofera Blue with a contact layer. 4/1; no major change in these wounds areas. Left medial and more recently left  lateral calf. I tried Iodoflex last week to aid in debridement she did not tolerate this. She stated her pain was terrible all week. She took the top layer of the 4 layer compression off. 4/8; the patient actually looks somewhat better in terms of her more prominent left lateral calf wound. There is some healthy looking tissue here. She is still complaining of a lot of discomfort. 4/15; patient in a lot of pain secondary to sciatica. She is on a prednisone taper prescribed by her primary physician. She has the 2 areas one on the left medial and more recently a smaller area on the left lateral calf. Both of these just above the malleoli 4/22; her back pain is better but she still states she is very uncomfortable and now feels she is intolerant to the [employer]. No real change in the wounds we have been using Sorbact. She has been previously intolerant to Iodoflex. There is not a lot of option about what we can use to debride this wound under compression that she no doubt needs. sHe states Ultram no longer works for her pain 4/29; no major change in the wounds slightly increased depth. Surface on the original medial wound perhaps somewhat improved however the more recent area on the lateral left ankle is 100% covered in very adherent debris we have been using  Sorbact. She tolerates 4 layer compression well and her edema control is a lot better. She has not had to come in for a nurse check 5/6; no major change in the condition of the wounds. She did consent to debridement today which was done with some difficulty. Continuing Sorbact. She did not tolerate Iodoflex. She was in for a check of her compression the day after we wrapped her last week this was adjusted but nothing much was found 5/13; no major change in the condition or area of the wounds. I was able to get a fairly aggressive debridement done on the lateral left leg wound. Even using Sorbact under compression. She came back in on Friday to  have the wrap changed. She says she felt uncomfortable on the lateral aspect of her ankle. She has a long history of chronic venous insufficiency including previous ablation surgery on this side. 5/20-Patient returns for wounds on left leg with both wounds covered in slough, with the lateral leg wound larger in size, she has been in 3 layer compression and felt more comfortable, she describes pain in ankle, in leg and pins and needles in foot, and is about to try Pamelor for this 6/3; wounds on the left lateral and left medial leg. The area medially which is the most recent of the 2 seems to have had the largest increase in dimensions. We have been using Sorbac to try and debride the surface. She has been to see orthopedics SHAZIA, WEHRLY ([id number]) they apparently did a plain x-ray that was indeterminant. Diagnosed her with neuropathy and they have ordered an MRI to determine if there is underlying osteomyelitis. This was not high on my thought list but I suppose it is prudent. We have advised her to make an appointment with vein and vascular in [place]. She has a history of a left greater saphenous and accessory vein ablations I wonder if there is anything else that can be done from a surgical point of view to help in these difficult refractory wounds. We have previously healed this wound on one occasion but it keeps on reopening [medial side] 6/10; deep tissue culture I did last week I think on the left medial wound showed both moderate E. coli and moderate staph aureus [MSSA]. She is going to require antibiotics and I have chosen Augmentin. We have been using Sorbact and we have made better looking wound surface on both sides but certainly no improvement in wound area. She was back in last Friday apparently for a dressing changes the wrap was hurting her outer left ankle. She has not managed to get a hold of vein and vascular in [place]. We are going to have to make her that  appointment 6/17; patient is tolerating the Augmentin. She had an MRI that I think was ordered by orthopedic surgeon this did not show osteomyelitis or an abscess did suggest cellulitis. We have been using Sorbact to the lateral and medial ankles. We have been trying to arrange a follow-up appointment with vein and vascular in [place] or did her original ablations. We apparently an area sent the request to vein and vascular in [facility] 6/24; patient has completed the Augmentin. We do not yet have a vein and vascular appointment in [place]. I am not sure what the issue is here we have asked her to call tomorrow. We are using Sorbact. Making some improvements and especially the medial wound. Both surfaces however look better medial and lateral. 7/1; the patient has been in  contact with vein and vascular in [place] but has not yet received an appointment. Using Sorbact we have gradually improve the wound surface with no improvement in surface area. She is approved for Apligraf but the wound surface still is not completely viable. She has not had to come in for a dressing change 7/8; the patient has an appointment with vein and vascular on 7/31 which is a Friday afternoon. She is concerned about getting back here for [place] to dress her wounds. I think it is important to have them goal for her venous reflux/history of ablations etc. to see if anything else can be done. She apparently tested positive for 1 of the blood tests with regards to lupus and saw a rheumatologist. He has raised the issue of vasculitis again. I have had this thought in the past however the evidence seems overwhelming that this is a venous reflux etiology. If the rheumatologist tells me there is clinical and laboratory investigation is positive for lupus I will rethink this. 7/15; the patient's wound surfaces are quite a bit better. The medial area which was her original wound now has no depth although the lateral wound  which was the more recent area actually appears larger. Both with viable surfaces which is indeed better. Using Sorbact. I wanted to use Apligraf on her however there is the issue of the vein and vascular appointment on 7/31 at 2:00 in the afternoon which would not allow her to get back to be rewrapped and they would no doubt remove the graft 7/22; the patient's wound surfaces have moderate amount of debris although generally look better. The lateral one is larger with 2 small satellite areas superiorly. We are waiting for her vein and vascular appointment on 7/31. She has been approved for Apligraf which I would like to use after th 7/29; wound surfaces have improved no debridement is required we have been using Sorbact. She sees vein and vascular on Friday with this so question of whether anything can be done to lessen the likelihood of recurrence and/or speed the healing of these areas. She is already had previous ablations. She no doubt has severe venous hypertension 8/5-Patient returns at 1 week, she was in [place] for 3 days by her podiatrist, we have been using so backed to the wound, she has increased pain in both the wounds on the left lower leg especially the more distal one on the lateral aspect 8/12-Patient returns at 1 week and she is agreeable to having debridement in both wounds on her left leg today. We have been using Sorbact, and vascular studies were reviewed at last visit 8/19; the patient arrives with her wounds fairly clean and no debridement is required. We have used Sorbact which is really done a nice job in cleaning up these very difficult wound surfaces. The patient saw Dr. Donzetta Matters of vascular surgery on 7/31. He did not feel that there was an arterial component. He felt that her treated greater saphenous vein is adequately addressed and that the small saphenous vein did not appear to be involved significantly. She was also noted to have deep venous reflux which is not  treatable. Dr. Donzetta Matters mentioned the possibility of a central obstructive component leading to reflux and he offered her central venography. She wanted to discuss this or think about it. I have urged her to go ahead with this. She has had recurrent difficult wounds in these areas which do heal but after months in the clinic. If there is anything  that can be done to reduce the likelihood of this I think it is worth it. 9/2 she is still working towards getting follow-up with Dr. Donzetta Matters to schedule her CT. Things are quite a bit worse venography. I put Apligraf on 2 weeks ago on both wounds on the medial and lateral part of her left lower leg. She arrives in clinic today with 3 superficial additional wounds above the area laterally and one below the wound medially. She describes a lot of discomfort. I think these are probably wrapped injuries. Does not look like she has cellulitis. 07/20/2019 on evaluation today patient appears to be doing somewhat poorly in regard to her lower extremity ulcers. She in fact showed signs of erythema in fact we may even be dealing with an infection at this time. Unfortunately I am unsure if this is just infection or if indeed there may be some allergic reaction that occurred as a result of the Apligraf application. With that being said that would be unusual but nonetheless not impossible in this patient is one who is unfortunately allergic to quite a bit. Currently we have been using the Sorbact which seems to do as well as anything for her. I do think we may want to obtain AARIAN, BURFEIND ([id number]) a culture today to see if there is anything showing up there that may need to be addressed. 9/16; noted that last week the wounds look worse in 1 week follow-up of the Apligraf. Using Sorbact as of 2 days ago. She arrives with copious amounts of drainage and new skin breakdown on the back of the left calf. The wounds arm more substantial bilaterally. There is a fair amount of  swelling in the left calf no overt DVT there is edema present I think in the left greater than right thigh. She is supposed to go on 9/28 for CT venography. The wounds on the medial and lateral calf are worse and she has new skin breakdown posteriorly at least new for me. This is almost developing into a circumferential wound area The Apligraf was taken off last week which I agree with things are not going in the right direction a culture was done we do not have that back yet. She is on Augmentin that she started 2 days ago 9/23; dressing was changed by her nurses on Monday. In general there is no improvement in the wound areas although the area looks less angry than last week. She did get Augmentin for MSSA cultured on the 14th. She still appears to have too much swelling in the left leg even with 3 layer compression 9/30; the patient underwent her procedure on 9/28 by Dr. Donzetta Matters at vascular and vein specialist. She was discovered to have the common iliac vein measuring 12.2 mm but at the level of L4-L5 measured 3 mm. After stenting it measured 10 mm. 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 Rouleaux flow in the common femoral and femoral vein was observed much improved after stenting. We are using silver alginate to the wounds on the medial and lateral ankle on the left. 4 layer compression 10/7; the patient had fluid swelling around her knee and 4 layer compression. At the advice of vein and vascular this was reduced to 3 layer which she is tolerating better. We have been using silver alginate under 3 layer compression since last Friday 10/14; arrives with the areas on the left ankle looking a lot better. Inflammation in the area also a lot better.  She came in for a nurse check on 10/9 10/21; continued nice improvement. Slight improvements in surface area of both the medial and lateral wounds on the left. A lot of the satellite lesions in the weeping erythema around these from  stasis dermatitis is resolved. We have been using silver alginate 10/28; general improvement in the entire wound areas although not a lot of change in dimensions the wound certainly looks better. There is a lot less in terms of venous inflammation. Continue silver alginate this week however look towards Hydrofera Blue next week 11/4; very adherent debris on the medial wound left wound is not as bad. We have been using silver alginate. Change to [facility] today 11/11; very adherent debris on both wound areas. She went to vein and vascular last week and follow-up they put in Home boot on this today. He says the [facility] was adherent. Wound is definitely not as good as last week. Especially on the left there the satellite lesions look more prominent 11/18; absolutely no better. erythema on lateral aspect with tenderness. 09/30/2019 on evaluation today patient appears to actually be doing better. Dr. Dellia Nims did put her on doxycycline last week which I do believe has helped her at this point. Fortunately there is no signs of active infection at this time. No fevers, chills, nausea, vomiting, or diarrhea. I do believe he may want extend the doxycycline for 7 additional days just to ensure everything does completely cleared up the patient is in agreement with that plan. Otherwise she is going require some sharp debridement today 12/2; patient is completing a 2-week course of doxycycline. I gave her this empirically for inflammation as well as infection when I last saw her 2 weeks ago. All of this seems to be better. She is using silver alginate she has the area on the medial aspect of the larger area laterally and the 2 small satellite regions laterally above the major wound. 12/9; the patient's wound on the left medial and left lateral calf look really quite good. We have been using silver alginate. She saw vein and vascular in follow-up on 10/09/2019. She has had a previous left greater  saphenous vein ablation by Dr. Oscar La in 2016. More recently she underwent a left common iliac vein stent by Dr. 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 The swelling is improved and certainly the wounds have improved. The patient shows [place] today area on the right medial calf there is almost no wound but leaking lymphedema. She says she start this started 3 or 4 days ago. She did not traumatize it. It is not painful. She does not wear compression on that side 12/16; the patient continues to do well laterally. Medially still requiring debridement. The area on the right calf did not materialize to anything and is not currently open. We wrapped this last time. She has support stockings for that leg although I am not sure they are going to provide adequate compression 12/23; the lateral wound looks stable. Medially still requiring debridement for tightly adherent fibrinous debris. We've been using silver alginate. Surface area not any different 12/30; neither wound is any better with regards to surface and the area on the left lateral is larger. I been using silver alginate to the left lateral which look quite good last week and Sorbact to the left medial MATTEA, BERRIGAN. ([id number]) Objective Constitutional Patient is hypertensive.. Pulse regular and within target range for patient.Marland Kitchen Respirations regular, non-labored and within target range.. Temperature  is normal and within the target range for the patient.Marland Kitchen appears in no distress. Vitals Time Taken: 12:56 PM, Height: 63 in, Weight: 224.7 lbs, BMI: 39.8, Temperature: 98.6 F, Pulse: 91 bpm, Respiratory Rate: 16 breaths/min, Blood Pressure: 146/84 mmHg. Respiratory Respiratory effort is easy and symmetric bilaterally. Rate is normal at rest and on room air.. Cardiovascular Pedal pulses are palpable in the left foot although reduced. Edema control is quite good. Psychiatric No evidence of depression, anxiety, or agitation. Calm,  cooperative, and communicative. Appropriate interactions and affect.. Integumentary (Hair, Skin) No erythema around the wound. Wound #5 status is Open. Original cause of wound was Gradually Appeared. The wound is located on the Left,Medial Lower Leg. The wound measures 2.6cm length x 2.4cm width x 0.2cm depth; 4.901cm^2 area and 0.98cm^3 volume. There is Fat Layer (Subcutaneous Tissue) Exposed exposed. There is no tunneling or undermining noted. There is a large amount of serosanguineous drainage noted. The wound margin is flat and intact. There is medium (34-66%) pale granulation within the wound bed. There is a medium (34-66%) amount of necrotic tissue within the wound bed including Adherent Slough. Wound #6 status is Open. Original cause of wound was Gradually Appeared. The wound is located on the Left,Lateral Lower Leg. The wound measures 4.5cm length x 3.5cm width x 0.3cm depth; 12.37cm^2 area and 3.711cm^3 volume. There is Fat Layer (Subcutaneous Tissue) Exposed exposed. There is no tunneling or undermining noted. There is a large amount of serosanguineous drainage noted. The wound margin is flat and intact. There is medium (34-66%) pink granulation within the wound bed. There is a medium (34-66%) amount of necrotic tissue within the wound bed including Adherent Slough. Wound #9 status is Open. Original cause of wound was Gradually Appeared. The wound is located on the Left,Lateral,Posterior Lower Leg. The wound measures 1.2cm length x 0.8cm width x 0.1cm depth; 0.754cm^2 area and 0.075cm^3 volume. There is Fat Layer (Subcutaneous Tissue) Exposed exposed. There is a large amount of serosanguineous drainage noted. The wound margin is flat and intact. There is large (67-100%) pink granulation within the wound bed. There is a small (1-33%) amount of necrotic tissue within the wound bed including Adherent Slough. Assessment Active Problems MARDA, MIR ([id number]) ICD-10 Non-pressure  chronic ulcer of left calf limited to breakdown of skin Chronic venous hypertension (idiopathic) with inflammation of right lower extremity Lymphedema, not elsewhere classified Non-pressure chronic ulcer of other part of right lower leg with other specified severity Plan Wound Cleansing: Wound #5 Left,Medial Lower Leg: Cleanse wound with mild soap and water Wound #6 Left,Lateral Lower Leg: Cleanse wound with mild soap and water Wound #9 Left,Lateral,Posterior Lower Leg: Cleanse wound with mild soap and water Anesthetic (add to Medication List): Wound #5 Left,Medial Lower Leg: Topical Lidocaine 4% cream applied to wound bed prior to debridement (In Clinic Only). Wound #6 Left,Lateral Lower Leg: Topical Lidocaine 4% cream applied to wound bed prior to debridement (In Clinic Only). Wound #9 Left,Lateral,Posterior Lower Leg: Topical Lidocaine 4% cream applied to wound bed prior to debridement (In Clinic Only). Skin Barriers/Peri-Wound Care: Wound #5 Left,Medial Lower Leg: Triamcinolone Acetonide Ointment (TCA) Wound #6 Left,Lateral Lower Leg: Triamcinolone Acetonide Ointment (TCA) Wound #9 Left,Lateral,Posterior Lower Leg: Triamcinolone Acetonide Ointment (TCA) Primary Wound Dressing: Wound #5 Left,Medial Lower Leg: Other: - sorbact Wound #6 Left,Lateral Lower Leg: Other: - sorbact Wound #9 Left,Lateral,Posterior Lower Leg: Other: - sorbact Secondary Dressing: Wound #5 Left,Medial Lower Leg: Drawtex Wound #6 Left,Lateral Lower Leg: Drawtex Wound #9 Left,Lateral,Posterior Lower Leg: Drawtex  Dressing Change Frequency: Wound #5 Left,Medial Lower Leg: Change dressing every week Other: - as needed. Wound #6 Left,Lateral Lower Leg: Change dressing every week Other: - as needed. Wound #9 Left,Lateral,Posterior Lower Leg: Change dressing every week Other: - as needed. Follow-up Appointments: LANELL, COBBLER ([id number]) Wound #5 Left,Medial Lower Leg: Return Appointment in 1  week. Nurse Visit as needed - Call of needed Wound #6 Left,Lateral Lower Leg: Return Appointment in 1 week. Nurse Visit as needed - Call of needed Wound #9 Left,Lateral,Posterior Lower Leg: Return Appointment in 1 week. Nurse Visit as needed - Call of needed Edema Control: Wound #5 Left,Medial Lower Leg: 3 Layer Compression System - Left Lower Extremity - unna to anchor Wound #6 Left,Lateral Lower Leg: 3 Layer Compression System - Left Lower Extremity - unna to anchor Wound #9 Left,Lateral,Posterior Lower Leg: 3 Layer Compression System - Left Lower Extremity - unna to anchor 1. Sorbac to both wound areas 2. She will require an aggressive debridement next week. 3. I am not sure of the cause of the deterioration in the left lateral wound. This is larger and 100% tightly adherent slough Electronic Signature(s) Signed: 11/04/2019 4:48:37 PM By: Linton Ham MD Entered By: Linton Ham on 11/04/2019 13:16:34 [place]f, [name] -------------------------------------------------------------------------------- SuperBill Details Patient Name: NOVALEIGH, MOCHEL. Date of Service: 11/04/2019 Medical Record Number: [id number] Patient Account Number: [id number] Date of Birth/Sex: [birth year] (73 y.o. F) Treating RN: Primary Care Provider: [name] Other Clinician: Referring Provider: [name] Treating Provider/Extender: Tito Dine in Treatment: 47 Diagnosis Coding ICD-10 Codes Code Description [phone] Non-pressure chronic ulcer of left calf limited to breakdown of skin I87.321 Chronic venous hypertension (idiopathic) with inflammation of right lower extremity I89.0 Lymphedema, not elsewhere classified L97.818 Non-pressure chronic ulcer of other part of right lower leg with other specified severity Facility Procedures CPT4 Code: [id number] Description: 99214 - WOUND CARE VISIT-LEV 4 EST PT Modifier: Quantity: 1 Physician Procedures CPT4 Code  Description: [id number] [place] - WC PHYS LEVEL 3 - EST PT ICD-10 Diagnosis Description L97.221 Non-pressure chronic ulcer of left calf limited to breakdown of I87.321 Chronic venous hypertension (idiopathic) with inflammation of r I89.0 Lymphedema,  not elsewhere classified Modifier: skin ight lower extr Quantity: 1 emity Electronic Signature(s) Signed: 11/04/2019 4:48:37 PM By: Linton Ham MD Entered By: Linton Ham on 11/04/2019 13:17:29

## 2019-11-09 ENCOUNTER — Ambulatory Visit: Payer: 59 | Admitting: Podiatry

## 2019-11-09 DIAGNOSIS — H25811 Combined forms of age-related cataract, right eye: Secondary | ICD-10-CM | POA: Diagnosis not present

## 2019-11-09 DIAGNOSIS — Z961 Presence of intraocular lens: Secondary | ICD-10-CM | POA: Diagnosis not present

## 2019-11-09 DIAGNOSIS — H524 Presbyopia: Secondary | ICD-10-CM | POA: Diagnosis not present

## 2019-11-09 DIAGNOSIS — D3132 Benign neoplasm of left choroid: Secondary | ICD-10-CM | POA: Diagnosis not present

## 2019-11-11 ENCOUNTER — Other Ambulatory Visit: Payer: Self-pay

## 2019-11-11 ENCOUNTER — Encounter: Payer: Medicare Other | Attending: Internal Medicine | Admitting: Internal Medicine

## 2019-11-11 DIAGNOSIS — Z923 Personal history of irradiation: Secondary | ICD-10-CM | POA: Insufficient documentation

## 2019-11-11 DIAGNOSIS — I87321 Chronic venous hypertension (idiopathic) with inflammation of right lower extremity: Secondary | ICD-10-CM | POA: Insufficient documentation

## 2019-11-11 DIAGNOSIS — J45909 Unspecified asthma, uncomplicated: Secondary | ICD-10-CM | POA: Diagnosis not present

## 2019-11-11 DIAGNOSIS — I890 Lymphedema, not elsewhere classified: Secondary | ICD-10-CM | POA: Insufficient documentation

## 2019-11-11 DIAGNOSIS — I872 Venous insufficiency (chronic) (peripheral): Secondary | ICD-10-CM | POA: Insufficient documentation

## 2019-11-11 DIAGNOSIS — Z86718 Personal history of other venous thrombosis and embolism: Secondary | ICD-10-CM | POA: Insufficient documentation

## 2019-11-11 DIAGNOSIS — Z85820 Personal history of malignant melanoma of skin: Secondary | ICD-10-CM | POA: Insufficient documentation

## 2019-11-11 DIAGNOSIS — G4733 Obstructive sleep apnea (adult) (pediatric): Secondary | ICD-10-CM | POA: Insufficient documentation

## 2019-11-11 DIAGNOSIS — L97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d: Secondary | ICD-10-CM | POA: Diagnosis not present

## 2019-11-11 DIAGNOSIS — L97818 Non-pressure chronic ulcer of other part of right lower leg with other specified severity: Secondary | ICD-10-CM | POA: Insufficient documentation

## 2019-11-11 DIAGNOSIS — L97221 Non-pressure chronic ulcer of left calf limited to breakdown of skin: Secondary | ICD-10-CM | POA: Insufficient documentation

## 2019-11-11 NOTE — Progress Notes (Addendum)
[name], [name] Visit Report for 11/11/2019 Debridement Details Patient Name: [name], [name]. Date of Service: 11/11/2019 12:45 PM Medical Record Number: [id number] Patient Account Number: [id number] Date of Birth/Sex: [birth year] (74 y.o. F) Treating RN: Cornell Barman Primary Care Provider: [name] Other Clinician: Referring Provider: [name] Treating Provider/Extender: Tito Dine in Treatment: 48 Debridement Performed for Wound #5 Left,Medial Lower Leg Assessment: Performed By: Physician [name] Dillon, MD Debridement Type: Debridement Level of Consciousness (Pre- Awake and Alert procedure): Pre-procedure Verification/Time Yes - 12:59 Out Taken: Start Time: 12:59 Pain Control: Lidocaine Total Area Debrided (L x W): 2.4 (cm) x 2.4 (cm) = 5.76 (cm) Tissue and other material Viable, Slough, Subcutaneous, Slough debrided: Level: Skin/Subcutaneous Tissue Debridement Description: Excisional Instrument: Curette Bleeding: Minimum Hemostasis Achieved: Pressure End Time: 13:02 Response to Treatment: Procedure was tolerated well Level of Consciousness Awake and Alert (Post-procedure): Post Debridement Measurements of Total Wound Length: (cm) 2.4 Width: (cm) 2.4 Depth: (cm) 0.3 Volume: (cm) 1.357 Character of Wound/Ulcer Post Debridement: Requires Further Debridement Post Procedure Diagnosis Same as Pre-procedure Electronic Signature(s) Signed: 11/11/2019 4:45:14 PM By: Linton Ham MD Signed: 11/11/2019 4:51:02 PM By: [name], [name], RN, CWS, [name], [name] Entered By: Linton Ham on 11/11/2019 13:21:01 [name], [name] -------------------------------------------------------------------------------- HPI Details Patient Name: [name], [name]. Date of Service: 11/11/2019 12:45 PM Medical Record Number: [id number] Patient Account Number: [id number] Date of Birth/Sex: [birth year] (74 y.o. F) Treating RN: Cornell Barman Primary Care  Provider: [name] Other Clinician: Referring Provider: [name] Treating Provider/Extender: Tito Dine in Treatment: 25 History of Present Illness HPI Description: Pleasant 74 year old with history of chronic venous insufficiency. No diabetes or peripheral vascular disease. Left ABI 1.29. Questionable history of left lower extremity DVT. She developed a recurrent ulceration on her left lateral calf in December 2015, which she attributes to poor diet and subsequent lower extremity edema. She underwent endovenous laser ablation of her left greater saphenous vein in 2010. She underwent laser ablation of accessory branch of left GSV in April 2016 by Dr. Kellie Simmering at [facility]. She was previously wearing Unna boots, which she tolerated well. Tolerating 2 layer compression and cadexomer iodine. She returns to clinic for follow-up and is without new complaints. She denies any significant pain at this time. She reports persistent pain with pressure. No claudication or ischemic rest pain. No fever or chills. No drainage. READMISSION 11/13/16; this is a 74 year old woman who is not a diabetic. She is here for a review of a painful area on her left medial lower extremity. I note that she was seen here previously last year for wound I believe to be in the same area. At that time she had undergone previously a left greater saphenous vein ablation by Dr. Kellie Simmering and she had a ablation of the anterior accessory branch of the left greater saphenous vein in March 2016. Seeing that the wound actually closed over. In reviewing the history with her today the ulcer in this area has been recurrent. She describes a biopsy of this area in 2009 that only showed stasis physiology. 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 Do not see that she has ever had arterial studies in the left leg. She tells me she has a follow-up  with Dr. Kellie Simmering in roughly 10 days In any case she developed the reopening of this area roughly a month ago. On the background of this she describes rapidly increasing edema which has responded to Lasix 40 mg and metolazone 2.5 mg as well as the patient's lymph massage. She has been told she has both venous insufficiency and lymphedema but she cannot tolerate compression stockings 11/28/16; the patient saw Dr. Kellie Simmering recently. Per the patient he did arterial Dopplers in the office that did not show evidence of arterial insufficiency, per the patient he stated "treat this like an ordinary venous ulcer". She also saw her dermatologist Dr. Ronnald Ramp who felt that this was more of a vascular ulcer. In general things are improving although she arrives today with increasing bilateral lower extremity edema with weeping a deeper fluid through the wound on the left medial leg compatible with some degree of lymphedema 12/04/16; the patient's wound is fully epithelialized but I don't think fully healed. We will do another week of depression with Promogran and TCA however I suspect we'll be able to discharge her next week. This is a very unusual-looking wound which was initially a figure-of-eight type wound lying on its side surrounded by petechial like hemorrhage. She has had venous ablation on this side. She apparently does not have an arterial issue per Dr. Kellie Simmering. She saw her dermatologist thought it was "vascular". Patient is definitely going to need ongoing compression and I talked about this with her today she will go to elastic therapy after she leaves here next week 12/11/16; the patient's wound is not completely closed today. She has surrounding scar tissue and in further discussion with the patient it would appear that she had ulcers in this area in 2009 for a  prolonged period of time ultimately requiring a punch biopsy of this area that only showed venous insufficiency. I did not previously pickup on this part of the history from the patient. 12/18/16; the patient's wound is completely epithelialized. There is no open area here. She has significant bilateral venous insufficiency with secondary lymphedema to a mild-to-moderate degree she does not have compression stockings.. She did not say anything to me when I was in the room, she told our intake nurse that she was still having pain in this area. This isn't unusual recurrent small open area. She is going to go to elastic therapy to obtain compression stockings. 12/25/16; the patient's wound is fully epithelialized. There is no open area here. The patient describes some continued episodic discomfort in this area medial left calf. However everything looks fine and healed here. She is been to elastic therapy and caught herself 15-20 mmHg stockings, they apparently were having trouble getting 20-30 mm stockings in her size MEGAHN, SHORTY ([id number]) 01/22/17; this is a patient we discharged from the clinic a month ago. She has a recurrent open wound on her medial left calf. She had 15 mm support stockings. I told her I thought she needed 20-30 mm compression stockings. She tells me that she has been ill with hospitalization secondary to asthma and is been found to have severe hypokalemia likely secondary to a combination of Lasix and metolazone. This morning she noted blistering and leaking fluid on the posterior part of her left leg. She called our intake nurse urgently and we was saw her this afternoon. She has not had any real discomfort here. I don't know that she's been wearing any stockings on this leg for at least 2-3 days. ABIs in this  clinic were 1.21 on the right and 1.3 on the left. She is previously seen vascular surgery who does not think that there is a peripheral arterial issue. 01/30/17;  Patient arrives with no open wound on the left leg. She has been to elastic therapy and obtained 20-14mmhg below knee stockings and she has one on the right leg today. READMISSION 02/19/18; this Clagett is a now 74 year old patient we've had in this clinic perhaps 3 times before. I had last looked at her from January 07 December 2016 with an area on the medial left leg. We discharged her on 12/25/16 however she had to be readmitted on 01/22/17 with a recurrence. I have in my notes that we discharged her on 20-30 mm stockings although she tells me she was only wearing support hose because she cannot get stockings on predominantly related to her cervical spine surgery/issues. She has had previous ablations done by vein and vascular in [place] including a great saphenous vein ablation on the left with an anterior accessory branch ablation I think both of these were in 2016. On one of the previous visit she had a biopsy noted 2009 that was negative. She is not felt to have an arterial issue. She is not a diabetic. She does have a history of obstructive sleep apnea hypertension asthma as well as chronic venous insufficiency and lymphedema. On this occasion she noted 2 dry scaly patch on her left leg. She tried to put lotion on this it didn't really help. There were 2 open areas.the patient has been seeing her primary physician from 02/05/18 through 02/14/18. She had Unna boots applied. The superior wound now on the lateral left leg has closed but she's had one wound that remains open on the lateral left leg. This is not the same spot as we dealt with in 2018. ABIs in this clinic were 1.3 bilaterally 02/26/18; patient has a small wound on the left lateral calf. Dimensions are down. She has chronic venous insufficiency and lymphedema. 03/05/18; small open area on the left lateral calf. Dimensions are down. Tightly adherent necrotic debris over the surface of the wound which was difficult to remove. Also the  dressing [over collagen] stuck to the wound surface. This was removed with some difficulty as well. Change the primary dressing to Hydrofera Blue ready 03/12/18; small open area on the left lateral calf. Comes in with tightly adherent surface eschar as well as some adherent Hydrofera Blue. 03/19/18; open area on the left lateral calf. Again adherent surface eschar as well as some adherent Hydrofera Blue nonviable subcutaneous tissue. She complained of pain all week even with the reduction from 4-3 layer compression I put on last week. Also she had an increase in her ankle and calf measurements probably related to the same thing. 03/26/18; open area on the left lateral calf. A very small open area remains here. We used silver alginate starting last week as the Hydrofera Blue seem to stick to the wound bed. In using 4-layer compression 04/02/18; the open area in the left lateral calf at some adherent slough which I removed there is no open area here. We are able to transition her into her own compression stocking. Truthfully I think this is probably his support hose. However this does not maintain skin integrity will be limited. She cannot put over the toe compression stockings on because of neck problems hand problems etc. She is allergic to the lining layer of juxta lites. We might be forced to use extremitease stocking  should this fail READMIT 11/24/2018 Patient is now a 74 year old woman who is not a diabetic. She has been in this clinic on at least 3 previous occasions largely with recurrent wounds on her left leg secondary to chronic venous insufficiency with secondary lymphedema. Her situation is complicated by inability to get stockings on and an allergy to neoprene which is apparently a component and at least juxta lites and other stockings. As a result she really has not been wearing any stockings on her legs. She tells [place] that roughly 2 or 3 weeks ago she started noticing a stinging sensation  just above her ankle on the left medial aspect. She has been diagnosed with pseudogout and she wondered whether this was what she was experiencing. She tried to dress this with something she bought at the store however subsequently it pulled skin off and now she has an open wound that is not improving. She has been using Vaseline gauze with a cover bandage. She saw her primary doctor last week who put an Haematologist on her. ABIs in this clinic was 1.03 on the left [name], [name]. ([id number]) 2/12; the area is on the left medial ankle. Odd-looking wound with what looks to be surface epithelialization but a multitude of small petechial openings. This clearly not closed yet. We have been using silver alginate under 3 layer compression with TCA 2/19; the wound area did not look quite as good this week. Necrotic debris over the majority of the wound surface which required debridement. She continues to have a multitude of what looked to be small petechial openings. She reminds [place] that she had a biopsy on this initially during her first outbreak in 2015 in [place] dermatology. She expresses concern about this being a possible melanoma. 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 I assured her that this does not look anything like melanoma. Except for the petechial reaction it does look like a venous insufficiency area and she certainly has evidence of this on both sides 2/26; a difficult area on the left medial ankle. The patient clearly has chronic venous hypertension with some degree of lymphedema. The odd thing about the area is the small petechial hemorrhages. I am not really sure how to explain this. This was present last time and this is not a compression injury. We have been using Hydrofera Blue which I changed to last week 3/4; still using Hydrofera Blue. Aggressive debridement today. She does not have known  arterial issues. She has seen Dr. Kellie Simmering at [facility] vein and vascular and and has an ablation on the left. [Anterior accessory branch of the greater saphenous]. From what I remember they did not feel she had an arterial issue. The patient has had this area biopsied in 2009 at [facility] and by her recollection they said this was "stasis". She is also follow-up with dermatology locally who thought that this was more of a vascular issue 3/11; using Hydrofera Blue. Aggressive debridement today. She does not have an arterial issue. We are using 3 layer compression although we may need to go to 4. The patient has been in for multiple changes to her wrap since I last saw her a week ago. She says that the area was leaking. I do not have too much more information on what was found 01/19/19 on evaluation today patient was actually being seen for a nurse visit when unfortunately she had the area  on her left lateral lower extremity as well as weeping from the right lower extremity that became apparent. Therefore we did end up actually seeing her for a full visit with myself. She is having some pain at this site as well but fortunately nothing too significant at this point. No fevers, chills, nausea, or vomiting noted at this time. 3/18-Patient is back to the clinic with the left leg venous leg ulcer, the ulcer is larger in size, has a surface that is densely adherent with fibrinous tissue, the Hydrofera Blue was used but is densely adherent and there was difficulty in removing it. The right lower extremity was also wrapped for weeping edema. Patient has a new area over the left lateral foot above the malleolus that is small and appears to have no debris with intact surrounding skin. Patient is on increased dose of Lasix also as a means to edema management 3/25; the patient has a nonhealing venous ulcer on the medial left leg and last week developed a smaller area on the lateral left calf. We have  been using Hydrofera Blue with a contact layer. 4/1; no major change in these wounds areas. Left medial and more recently left lateral calf. I tried Iodoflex last week to aid in debridement she did not tolerate this. She stated her pain was terrible all week. She took the top layer of the 4 layer compression off. 4/8; the patient actually looks somewhat better in terms of her more prominent left lateral calf wound. There is some healthy looking tissue here. She is still complaining of a lot of discomfort. 4/15; patient in a lot of pain secondary to sciatica. She is on a prednisone taper prescribed by her primary physician. She has the 2 areas one on the left medial and more recently a smaller area on the left lateral calf. Both of these just above the malleoli 4/22; her back pain is better but she still states she is very uncomfortable and now feels she is intolerant to the [employer]. No real change in the wounds we have been using Sorbact. She has been previously intolerant to Iodoflex. There is not a lot of option about what we can use to debride this wound under compression that she no doubt needs. sHe states Ultram no longer works for her pain 4/29; no major change in the wounds slightly increased depth. Surface on the original medial wound perhaps somewhat improved however the more recent area on the lateral left ankle is 100% covered in very adherent debris we have been using Sorbact. She tolerates 4 layer compression well and her edema control is a lot better. She has not had to come in for a nurse check 5/6; no major change in the condition of the wounds. She did consent to debridement today which was done with some difficulty. Continuing Sorbact. She did not tolerate Iodoflex. She was in for a check of her compression the day after we wrapped her last week this was adjusted but nothing much was found 5/13; no major change in the condition or area of the wounds. I was able to get a fairly  aggressive debridement done on the lateral left leg wound. Even using Sorbact under compression. She came back in on Friday to have the wrap changed. She says she felt uncomfortable on the lateral aspect of her ankle. She has a long history of chronic venous insufficiency including previous ablation surgery on this side. 5/20-Patient returns for wounds on left leg with both wounds covered  in slough, with the lateral leg wound larger in size, she has been in 3 layer compression and felt more comfortable, she describes pain in ankle, in leg and pins and needles in foot, and is about to try Pamelor for this 6/3; wounds on the left lateral and left medial leg. The area medially which is the most recent of the 2 seems to have had the largest increase in dimensions. We have been using Sorbac to try and debride the surface. She has been to see orthopedics they apparently did a plain x-ray that was indeterminant. Diagnosed her with neuropathy and they have ordered an MRI to [name], [name]. ([id number]) determine if there is underlying osteomyelitis. This was not high on my thought list but I suppose it is prudent. We have advised her to make an appointment with vein and vascular in [place]. She has a history of a left greater saphenous and accessory vein ablations I wonder if there is anything else that can be done from a surgical point of view to help in these difficult refractory wounds. We have previously healed this wound on one occasion but it keeps on reopening [medial side] 6/10; deep tissue culture I did last week I think on the left medial wound showed both moderate E. coli and moderate staph aureus [MSSA]. She is going to require antibiotics and I have chosen Augmentin. We have been using Sorbact and we have made better looking wound surface on both sides but certainly no improvement in wound area. She was back in last Friday apparently for a dressing changes the wrap was hurting her outer  left ankle. She has not managed to get a hold of vein and vascular in [place]. We are going to have to make her that appointment 6/17; patient is tolerating the Augmentin. She had an MRI that I think was ordered by orthopedic surgeon this did not show osteomyelitis or an abscess did suggest cellulitis. We have been using Sorbact to the lateral and medial ankles. We have been trying to arrange a follow-up appointment with vein and vascular in [place] or did her original ablations. We apparently an area sent the request to vein and vascular in [facility] 6/24; patient has completed the Augmentin. We do not yet have a vein and vascular appointment in [place]. I am not sure what the issue is here we have asked her to call tomorrow. We are using Sorbact. Making some improvements and especially the medial wound. Both surfaces however look better medial and lateral. 7/1; the patient has been in contact with vein and vascular in [place] but has not yet received an appointment. Using Sorbact we have gradually improve the wound surface with no improvement in surface area. She is approved for Apligraf but the wound surface still is not completely viable. She has not had to come in for a dressing change 7/8; the patient has an appointment with vein and vascular on 7/31 which is a Friday afternoon. She is concerned about getting back here for [place] to dress her wounds. I think it is important to have them goal for her venous reflux/history of ablations etc. to see if anything else can be done. She apparently tested positive for 1 of the blood tests with regards to lupus and saw a rheumatologist. He has raised the issue of vasculitis again. I have had this thought in the past however the evidence seems overwhelming that this is a venous reflux etiology. If the rheumatologist tells me there is clinical and laboratory  investigation is positive for lupus I will rethink this. 7/15; the patient's wound  surfaces are quite a bit better. The medial area which was her original wound now has no depth although the lateral wound which was the more recent area actually appears larger. Both with viable surfaces which is indeed better. Using Sorbact. I wanted to use Apligraf on her however there is the issue of the vein and vascular appointment on 7/31 at 2:00 in the afternoon which would not allow her to get back to be rewrapped and they would no doubt remove the graft 7/22; the patient's wound surfaces have moderate amount of debris although generally look better. The lateral one is larger with 2 small satellite areas superiorly. We are waiting for her vein and vascular appointment on 7/31. She has been approved for Apligraf which I would like to use after th 7/29; wound surfaces have improved no debridement is required we have been using Sorbact. She sees vein and vascular on Friday with this so question of whether anything can be done to lessen the likelihood of recurrence and/or speed the healing of these areas. She is already had previous ablations. She no doubt has severe venous hypertension 8/5-Patient returns at 1 week, she was in [place] for 3 days by her podiatrist, we have been using so backed to the wound, she has increased pain in both the wounds on the left lower leg especially the more distal one on the lateral aspect 8/12-Patient returns at 1 week and she is agreeable to having debridement in both wounds on her left leg today. We have been using Sorbact, and vascular studies were reviewed at last visit 8/19; the patient arrives with her wounds fairly clean and no debridement is required. We have used Sorbact which is really done a nice job in cleaning up these very difficult wound surfaces. The patient saw Dr. Donzetta Matters of vascular surgery on 7/31. He did not feel that there was an arterial component. He felt that her treated greater saphenous vein is adequately addressed and that the small  saphenous vein did not appear to be involved significantly. She was also noted to have deep venous reflux which is not treatable. Dr. Donzetta Matters mentioned the possibility of a central obstructive component leading to reflux and he offered her central venography. She wanted to discuss this or think about it. I have urged her to go ahead with this. She has had recurrent difficult wounds in these areas which do heal but after months in the clinic. If there is anything that can be done to reduce the likelihood of this I think it is worth it. 9/2 she is still working towards getting follow-up with Dr. Donzetta Matters to schedule her CT. Things are quite a bit worse venography. I put Apligraf on 2 weeks ago on both wounds on the medial and lateral part of her left lower leg. She arrives in clinic today with 3 superficial additional wounds above the area laterally and one below the wound medially. She describes a lot of discomfort. I think these are probably wrapped injuries. Does not look like she has cellulitis. 07/20/2019 on evaluation today patient appears to be doing somewhat poorly in regard to her lower extremity ulcers. She in fact showed signs of erythema in fact we may even be dealing with an infection at this time. Unfortunately I am unsure if this is just infection or if indeed there may be some allergic reaction that occurred as a result of the Apligraf application.  With that being said that would be unusual but nonetheless not impossible in this patient is one who is unfortunately allergic to quite a bit. Currently we have been using the Sorbact which seems to do as well as anything for her. I do think we may want to obtain a culture today to see if there is anything showing up there that may need to be addressed. [name], [name] 9/16; noted that last week the wounds look worse in 1 week follow-up of the Apligraf. Using Sorbact as of 2 days ago. She arrives with copious amounts of drainage and new  skin breakdown on the back of the left calf. The wounds arm more substantial bilaterally. There is a fair amount of swelling in the left calf no overt DVT there is edema present I think in the left greater than right thigh. She is supposed to go on 9/28 for CT venography. The wounds on the medial and lateral calf are worse and she has new skin breakdown posteriorly at least new for me. This is almost developing into a circumferential wound area The Apligraf was taken off last week which I agree with things are not going in the right direction a culture was done we do not have that back yet. She is on Augmentin that she started 2 days ago 9/23; dressing was changed by her nurses on Monday. In general there is no improvement in the wound areas although the area looks less angry than last week. She did get Augmentin for MSSA cultured on the 14th. She still appears to have too much swelling in the left leg even with 3 layer compression 9/30; the patient underwent her procedure on 9/28 by Dr. Donzetta [facility]. She was discovered to have the common iliac vein measuring 12.2 mm but at the level of L4-L5 measured 3 mm. After stenting it measured 10 mm. 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 Rouleaux flow in the common femoral and femoral vein was observed much improved after stenting. We are using silver alginate to the wounds on the medial and lateral ankle on the left. 4 layer compression 10/7; the patient had fluid swelling around her knee and 4 layer compression. At the advice of vein and vascular this was reduced to 3 layer which she is tolerating better. We have been using silver alginate under 3 layer compression since last Friday 10/14; arrives with the areas on the left ankle looking a lot better. Inflammation in the area also a lot better. She came in for a nurse check on 10/9 10/21; continued nice improvement. Slight improvements in surface area of both  the medial and lateral wounds on the left. A lot of the satellite lesions in the weeping erythema around these from stasis dermatitis is resolved. We have been using silver alginate 10/28; general improvement in the entire wound areas although not a lot of change in dimensions the wound certainly looks better. There is a lot less in terms of venous inflammation. Continue silver alginate this week however look towards Hydrofera Blue next week 11/4; very adherent debris on the medial wound left wound is not as bad. We have been using silver alginate. Change to [facility] today 11/11; very adherent debris on both wound areas. She went to vein and vascular last week and follow-up they put in [place] boot on this today. He says the [facility] was adherent. Wound is definitely not as good as last week. Especially on  the left there the satellite lesions look more prominent 11/18; absolutely no better. erythema on lateral aspect with tenderness. 09/30/2019 on evaluation today patient appears to actually be doing better. Dr. Dellia Nims did put her on doxycycline last week which I do believe has helped her at this point. Fortunately there is no signs of active infection at this time. No fevers, chills, nausea, vomiting, or diarrhea. I do believe he may want extend the doxycycline for 7 additional days just to ensure everything does completely cleared up the patient is in agreement with that plan. Otherwise she is going require some sharp debridement today 12/2; patient is completing a 2-week course of doxycycline. I gave her this empirically for inflammation as well as infection when I last saw her 2 weeks ago. All of this seems to be better. She is using silver alginate she has the area on the medial aspect of the larger area laterally and the 2 small satellite regions laterally above the major wound. 12/9; the patient's wound on the left medial and left lateral calf look really quite good. We have been  using silver alginate. She saw vein and vascular in follow-up on 10/09/2019. She has had a previous left greater saphenous vein ablation by Dr. Oscar La in 2016. More recently she underwent a left common iliac vein stent by Dr. 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 The swelling is improved and certainly the wounds have improved. The patient shows [place] today area on the right medial calf there is almost no wound but leaking lymphedema. She says she start this started 3 or 4 days ago. She did not traumatize it. It is not painful. She does not wear compression on that side 12/16; the patient continues to do well laterally. Medially still requiring debridement. The area on the right calf did not materialize to anything and is not currently open. We wrapped this last time. She has support stockings for that leg although I am not sure they are going to provide adequate compression 12/23; the lateral wound looks stable. Medially still requiring debridement for tightly adherent fibrinous debris. We've been using silver alginate. Surface area not any different 12/30; neither wound is any better with regards to surface and the area on the left lateral is larger. I been using silver alginate to the left lateral which look quite good last week and Sorbact to the left medial [name], LEISURE ([id number]) 11/11/2019. Lateral wound area actually looks better and somewhat smaller. Medial still requires a very aggressive debridement today. We have been using Sorbact on both wound areas Electronic Signature(s) Signed: 11/13/2019 1:19:14 PM By: [name], [name], RN, CWS, [name], [name] Signed: 11/18/2019 12:52:17 PM By: Linton Ham MD Previous Signature: 11/11/2019 4:45:14 PM Version By: Linton Ham MD Entered By: [name], [name], RN, CWS, Kim on 11/13/2019 13:19:14 Knya, Endris [name] ([id number]) -------------------------------------------------------------------------------- Physical Exam Details Patient Name: CORDELL, GOLUB. Date of Service: 11/11/2019 12:45 PM Medical Record Number: [id number] Patient Account Number: [id number] Date of Birth/Sex: 09-11-1946 (74 y.o. F) Treating RN: Cornell Barman Primary Care Provider: [name] Other Clinician: Referring Provider: [name] Treating Provider/Extender: Tito Dine in Treatment: 40 Constitutional Patient is hypertensive.. Pulse regular and within target range for patient.Marland Kitchen Respirations regular, non-labored and within target range.. Temperature is normal and within the target range for the patient.Marland Kitchen appears in no distress. Cardiovascular Pedal pulses palpable on the left. Notes Wound exam; the area on the lateral ankle appears to be healthier and somewhat smaller  per our intake measurements. The area on the medial ankle still has very adherent debris. This required a an aggressive debridement removing tightly adherent fibrinous debris. Hemostasis with direct pressure Electronic Signature(s) Signed: 11/11/2019 4:45:14 PM By: Linton Ham MD Entered By: Linton Ham on 11/11/2019 13:23:47 Dier, [name] -------------------------------------------------------------------------------- Physician Orders Details Patient Name: ONETTA, WUJEK. Date of Service: 11/11/2019 12:45 PM Medical Record Number: [id number] Patient Account Number: [id number] Date of Birth/Sex: 29-Sep-1946 (74 y.o. F) Treating RN: Cornell Barman Primary Care Provider: [name] Other Clinician: Referring Provider: [name] Treating Provider/Extender: Tito Dine in Treatment: 42 Verbal / Phone Orders: No Diagnosis Coding Wound Cleansing Wound #5 Left,Medial Lower Leg o Cleanse wound with mild soap and water Wound #6 Left,Lateral Lower Leg o Cleanse wound with mild soap and water Wound #9 Left,Lateral,Posterior Lower Leg o Cleanse wound with mild soap and water Anesthetic (add to Medication List) Wound #5  Left,Medial Lower Leg o Topical Lidocaine 4% cream applied to wound bed prior to debridement (In Clinic Only). Wound #6 Left,Lateral Lower Leg o Topical Lidocaine 4% cream applied to wound bed prior to debridement (In Clinic Only). Wound #9 Left,Lateral,Posterior Lower Leg o Topical Lidocaine 4% cream applied to wound bed prior to debridement (In Clinic Only). Skin Barriers/Peri-Wound Care Wound #5 Left,Medial Lower Leg o Triamcinolone Acetonide Ointment (TCA) Wound #6 Left,Lateral Lower Leg o Triamcinolone Acetonide Ointment (TCA) Wound #9 Left,Lateral,Posterior Lower Leg o Triamcinolone Acetonide Ointment (TCA) Primary Wound Dressing Wound #5 Left,Medial Lower Leg o Other: - sorbact Wound #6 Left,Lateral Lower Leg o Other: - sorbact Wound #9 Left,Lateral,Posterior Lower Leg o Other: - sorbact Secondary Dressing Wound #5 Left,Medial Lower Leg o Drawtex CATALEA, BOUVIA. ([id number]) Wound #6 Left,Lateral Lower Leg o Drawtex Wound #9 Left,Lateral,Posterior Lower Leg o Drawtex Dressing Change Frequency Wound #5 Left,Medial Lower Leg o Change dressing every week o Other: - as needed. Wound #6 Left,Lateral Lower Leg o Change dressing every week o Other: - as needed. Wound #9 Left,Lateral,Posterior Lower Leg o Change dressing every week o Other: - as needed. Follow-up Appointments Wound #5 Left,Medial Lower Leg o Return Appointment in 1 week. o Nurse Visit as needed - Call of needed Wound #6 Left,Lateral Lower Leg o Return Appointment in 1 week. o Nurse Visit as needed - Call of needed Wound #9 Left,Lateral,Posterior Lower Leg o Return Appointment in 1 week. o Nurse Visit as needed - Call of needed Edema Control Wound #5 Left,Medial Lower Leg o 3 Layer Compression System - Left Lower Extremity - unna to anchor Wound #6 Left,Lateral Lower Leg o 3 Layer Compression System - Left Lower Extremity - unna to anchor Wound #9  Left,Lateral,Posterior Lower Leg o 3 Layer Compression System - Left Lower Extremity - unna to anchor Electronic Signature(s) Signed: 11/11/2019 4:45:14 PM By: Linton Ham MD Signed: 11/11/2019 4:51:02 PM By: [name], [name], RN, CWS, [name], [name] Entered By: [name], [name], RN, CWS, Kim on 11/11/2019 13:01:05 OSHYN, METRO ([id number]) -------------------------------------------------------------------------------- Problem List Details Patient Name: TAQUANA, WACHSMAN. Date of Service: 11/11/2019 12:45 PM Medical Record Number: [id number] Patient Account Number: [id number] Date of Birth/Sex: 1946-07-31 (74 y.o. F) Treating RN: Cornell Barman Primary Care Provider: [name] Other Clinician: Referring Provider: [name] Treating Provider/Extender: Tito Dine in Treatment: 33 Active Problems ICD-10 Evaluated Encounter Code Description Active Date Today Diagnosis L97.221 Non-pressure chronic ulcer of left calf limited to breakdown of 01/07/2019 No Yes skin I87.321 Chronic venous hypertension (idiopathic) with inflammation of  12/10/2018 No Yes right lower extremity I89.0 Lymphedema, not elsewhere classified 12/10/2018 No Yes L97.818 Non-pressure chronic ulcer of other part of right lower leg 10/14/2019 No Yes with other specified severity Inactive Problems Resolved Problems Electronic Signature(s) Signed: 11/11/2019 4:45:14 PM By: Linton Ham MD Entered By: Linton Ham on 11/11/2019 13:18:29 Leising, [name] -------------------------------------------------------------------------------- Progress Note Details Patient Name: AVANTE, BRACK. Date of Service: 11/11/2019 12:45 PM Medical Record Number: [id number] Patient Account Number: [id number] Date of Birth/Sex: August 03, 1946 (74 y.o. F) Treating RN: Cornell Barman Primary Care Provider: [name] Other Clinician: Referring Provider: [name] Treating Provider/Extender: Tito Dine in  Treatment: 48 Subjective History of Present Illness (HPI) Pleasant 74 year old with history of chronic venous insufficiency. No diabetes or peripheral vascular disease. Left ABI 1.29. Questionable history of left lower extremity DVT. She developed a recurrent ulceration on her left lateral calf in December 2015, which she attributes to poor diet and subsequent lower extremity edema. She underwent endovenous laser ablation of her left greater saphenous vein in 2010. She underwent laser ablation of accessory branch of left GSV in April 2016 by Dr. Kellie Simmering at [facility]. She was previously wearing Unna boots, which she tolerated well. Tolerating 2 layer compression and cadexomer iodine. She returns to clinic for follow-up and is without new complaints. She denies any significant pain at this time. She reports persistent pain with pressure. No claudication or ischemic rest pain. No fever or chills. No drainage. READMISSION 11/13/16; this is a 74 year old woman who is not a diabetic. She is here for a review of a painful area on her left medial lower extremity. I note that she was seen here previously last year for wound I believe to be in the same area. At that time she had undergone previously a left greater saphenous vein ablation by Dr. Kellie Simmering and she had a ablation of the anterior accessory branch of the left greater saphenous vein in March 2016. Seeing that the wound actually closed over. In reviewing the history with her today the ulcer in this area has been recurrent. She describes a biopsy of this area in 2009 that only showed stasis physiology. 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 Do not see that she has ever had arterial studies in the left leg. She tells me she has a  follow-up with Dr. Kellie Simmering in roughly 10 days In any case she developed the reopening of this area roughly a month ago. On the background of this she describes rapidly increasing edema which has responded to Lasix 40 mg and metolazone 2.5 mg as well as the patient's lymph massage. She has been told she has both venous insufficiency and lymphedema but she cannot tolerate compression stockings 11/28/16; the patient saw Dr. Kellie Simmering recently. Per the patient he did arterial Dopplers in the office that did not show evidence of arterial insufficiency, per the patient he stated "treat this like an ordinary venous ulcer". She also saw her dermatologist Dr. Ronnald Ramp who felt that this was more of a vascular ulcer. In general things are improving although she arrives today with increasing bilateral lower extremity edema with weeping a deeper fluid through the wound on the left medial leg compatible with some degree of lymphedema 12/04/16; the patient's wound is fully  epithelialized but I don't think fully healed. We will do another week of depression with Promogran and TCA however I suspect we'll be able to discharge her next week. This is a very unusual-looking wound which was initially a figure-of-eight type wound lying on its side surrounded by petechial like hemorrhage. She has had venous ablation on this side. She apparently does not have an arterial issue per Dr. Kellie Simmering. She saw her dermatologist thought it was "vascular". Patient is definitely going to need ongoing compression and I talked about this with her today she will go to elastic therapy after she leaves here next week 12/11/16; the patient's wound is not completely closed today. She has surrounding scar tissue and in further discussion with the patient it would appear that she had ulcers in this area in 2009 for a prolonged period of time ultimately requiring a punch biopsy of this area that only showed venous insufficiency. I did not previously  pickup on this part of the history from the patient. 12/18/16; the patient's wound is completely epithelialized. There is no open area here. She has significant bilateral venous insufficiency with secondary lymphedema to a mild-to-moderate degree she does not have compression stockings.. She did not say anything to me when I was in the room, she told our intake nurse that she was still having pain in this area. This isn't unusual recurrent small open area. She is going to go to elastic therapy to obtain compression stockings. 12/25/16; the patient's wound is fully epithelialized. There is no open area here. The patient describes some continued episodic discomfort in this area medial left calf. However everything looks fine and healed here. She is been to elastic therapy and KEICHA, PANTALEO ([id number]) caught herself 15-20 mmHg stockings, they apparently were having trouble getting 20-30 mm stockings in her size 01/22/17; this is a patient we discharged from the clinic a month ago. She has a recurrent open wound on her medial left calf. She had 15 mm support stockings. I told her I thought she needed 20-30 mm compression stockings. She tells me that she has been ill with hospitalization secondary to asthma and is been found to have severe hypokalemia likely secondary to a combination of Lasix and metolazone. This morning she noted blistering and leaking fluid on the posterior part of her left leg. She called our intake nurse urgently and we was saw her this afternoon. She has not had any real discomfort here. I don't know that she's been wearing any stockings on this leg for at least 2-3 days. ABIs in this clinic were 1.21 on the right and 1.3 on the left. She is previously seen vascular surgery who does not think that there is a peripheral arterial issue. 01/30/17; Patient arrives with no open wound on the left leg. She has been to elastic therapy and obtained 20-66mmhg below knee stockings and she  has one on the right leg today. READMISSION 02/19/18; this Curreri is a now 74 year old patient we've had in this clinic perhaps 3 times before. I had last looked at her from January 07 December 2016 with an area on the medial left leg. We discharged her on 12/25/16 however she had to be readmitted on 01/22/17 with a recurrence. I have in my notes that we discharged her on 20-30 mm stockings although she tells me she was only wearing support hose because she cannot get stockings on predominantly related to her cervical spine surgery/issues. She has had previous ablations done by vein and vascular  in [place] including a great saphenous vein ablation on the left with an anterior accessory branch ablation I think both of these were in 2016. On one of the previous visit she had a biopsy noted 2009 that was negative. She is not felt to have an arterial issue. She is not a diabetic. She does have a history of obstructive sleep apnea hypertension asthma as well as chronic venous insufficiency and lymphedema. On this occasion she noted 2 dry scaly patch on her left leg. She tried to put lotion on this it didn't really help. There were 2 open areas.the patient has been seeing her primary physician from 02/05/18 through 02/14/18. She had Unna boots applied. The superior wound now on the lateral left leg has closed but she's had one wound that remains open on the lateral left leg. This is not the same spot as we dealt with in 2018. ABIs in this clinic were 1.3 bilaterally 02/26/18; patient has a small wound on the left lateral calf. Dimensions are down. She has chronic venous insufficiency and lymphedema. 03/05/18; small open area on the left lateral calf. Dimensions are down. Tightly adherent necrotic debris over the surface of the wound which was difficult to remove. Also the dressing [over collagen] stuck to the wound surface. This was removed with some difficulty as well. Change the primary dressing to Hydrofera  Blue ready 03/12/18; small open area on the left lateral calf. Comes in with tightly adherent surface eschar as well as some adherent Hydrofera Blue. 03/19/18; open area on the left lateral calf. Again adherent surface eschar as well as some adherent Hydrofera Blue nonviable subcutaneous tissue. She complained of pain all week even with the reduction from 4-3 layer compression I put on last week. Also she had an increase in her ankle and calf measurements probably related to the same thing. 03/26/18; open area on the left lateral calf. A very small open area remains here. We used silver alginate starting last week as the Hydrofera Blue seem to stick to the wound bed. In using 4-layer compression 04/02/18; the open area in the left lateral calf at some adherent slough which I removed there is no open area here. We are able to transition her into her own compression stocking. Truthfully I think this is probably his support hose. However this does not maintain skin integrity will be limited. She cannot put over the toe compression stockings on because of neck problems hand problems etc. She is allergic to the lining layer of juxta lites. We might be forced to use extremitease stocking should this fail READMIT 11/24/2018 Patient is now a 74 year old woman who is not a diabetic. She has been in this clinic on at least 3 previous occasions largely with recurrent wounds on her left leg secondary to chronic venous insufficiency with secondary lymphedema. Her situation is complicated by inability to get stockings on and an allergy to neoprene which is apparently a component and at least juxta lites and other stockings. As a result she really has not been wearing any stockings on her legs. She tells [place] that roughly 2 or 3 weeks ago she started noticing a stinging sensation just above her ankle on the left medial aspect. She has been diagnosed with pseudogout and she wondered whether this was what she was  experiencing. She tried to dress this with something she bought at the store however subsequently it pulled skin off and now she has an open wound that is not improving. She has been  using Vaseline gauze with a cover bandage. She saw her primary doctor last week who put an Haematologist on her. ABIs in this clinic was 1.03 on the left MIKAIA, ALMARAZ. ([id number]) 2/12; the area is on the left medial ankle. Odd-looking wound with what looks to be surface epithelialization but a multitude of small petechial openings. This clearly not closed yet. We have been using silver alginate under 3 layer compression with TCA 2/19; the wound area did not look quite as good this week. Necrotic debris over the majority of the wound surface which required debridement. She continues to have a multitude of what looked to be small petechial openings. She reminds [place] that she had a biopsy on this initially during her first outbreak in 2015 in [place] [facility]. She expresses concern about this being a possible melanoma. 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 I assured her that this does not look anything like melanoma. Except for the petechial reaction it does look like a venous insufficiency area and she certainly has evidence of this on both sides 2/26; a difficult area on the left medial ankle. The patient clearly has chronic venous hypertension with some degree of lymphedema. The odd thing about the area is the small petechial hemorrhages. I am not really sure how to explain this. This was present last time and this is not a compression injury. We have been using Hydrofera Blue which I changed to last week 3/4; still using Hydrofera Blue. Aggressive debridement today. She does not have known arterial issues. She has seen Dr. Kellie Simmering at [facility] vein and vascular and and has an ablation on the left. [Anterior accessory  branch of the greater saphenous]. From what I remember they did not feel she had an arterial issue. The patient has had this area biopsied in 2009 at [facility] dermatology and by her recollection they said this was "stasis". She is also follow-up with dermatology locally who thought that this was more of a vascular issue 3/11; using Hydrofera Blue. Aggressive debridement today. She does not have an arterial issue. We are using 3 layer compression although we may need to go to 4. The patient has been in for multiple changes to her wrap since I last saw her a week ago. She says that the area was leaking. I do not have too much more information on what was found 01/19/19 on evaluation today patient was actually being seen for a nurse visit when unfortunately she had the area on her left lateral lower extremity as well as weeping from the right lower extremity that became apparent. Therefore we did end up actually seeing her for a full visit with myself. She is having some pain at this site as well but fortunately nothing too significant at this point. No fevers, chills, nausea, or vomiting noted at this time. 3/18-Patient is back to the clinic with the left leg venous leg ulcer, the ulcer is larger in size, has a surface that is densely adherent with fibrinous tissue, the Hydrofera Blue was used but is densely adherent and there was difficulty in removing it. The right lower extremity was also wrapped for weeping edema. Patient has a new area over the left lateral foot above the malleolus that is small and appears to have no debris with intact surrounding skin. Patient is on increased dose of Lasix also as a means to edema management 3/25; the patient has a  nonhealing venous ulcer on the medial left leg and last week developed a smaller area on the lateral left calf. We have been using Hydrofera Blue with a contact layer. 4/1; no major change in these wounds areas. Left medial and more recently left  lateral calf. I tried Iodoflex last week to aid in debridement she did not tolerate this. She stated her pain was terrible all week. She took the top layer of the 4 layer compression off. 4/8; the patient actually looks somewhat better in terms of her more prominent left lateral calf wound. There is some healthy looking tissue here. She is still complaining of a lot of discomfort. 4/15; patient in a lot of pain secondary to sciatica. She is on a prednisone taper prescribed by her primary physician. She has the 2 areas one on the left medial and more recently a smaller area on the left lateral calf. Both of these just above the malleoli 4/22; her back pain is better but she still states she is very uncomfortable and now feels she is intolerant to the [employer]. No real change in the wounds we have been using Sorbact. She has been previously intolerant to Iodoflex. There is not a lot of option about what we can use to debride this wound under compression that she no doubt needs. sHe states Ultram no longer works for her pain 4/29; no major change in the wounds slightly increased depth. Surface on the original medial wound perhaps somewhat improved however the more recent area on the lateral left ankle is 100% covered in very adherent debris we have been using Sorbact. She tolerates 4 layer compression well and her edema control is a lot better. She has not had to come in for a nurse check 5/6; no major change in the condition of the wounds. She did consent to debridement today which was done with some difficulty. Continuing Sorbact. She did not tolerate Iodoflex. She was in for a check of her compression the day after we wrapped her last week this was adjusted but nothing much was found 5/13; no major change in the condition or area of the wounds. I was able to get a fairly aggressive debridement done on the lateral left leg wound. Even using Sorbact under compression. She came back in on Friday to  have the wrap changed. She says she felt uncomfortable on the lateral aspect of her ankle. She has a long history of chronic venous insufficiency including previous ablation surgery on this side. 5/20-Patient returns for wounds on left leg with both wounds covered in slough, with the lateral leg wound larger in size, she has been in 3 layer compression and felt more comfortable, she describes pain in ankle, in leg and pins and needles in foot, and is about to try Pamelor for this 6/3; wounds on the left lateral and left medial leg. The area medially which is the most recent of the 2 seems to have had the largest increase in dimensions. We have been using Sorbac to try and debride the surface. She has been to see orthopedics JALESHIA, AWADA ([id number]) they apparently did a plain x-ray that was indeterminant. Diagnosed her with neuropathy and they have ordered an MRI to determine if there is underlying osteomyelitis. This was not high on my thought list but I suppose it is prudent. We have advised her to make an appointment with vein and vascular in [place]. She has a history of a left greater saphenous and accessory vein ablations  I wonder if there is anything else that can be done from a surgical point of view to help in these difficult refractory wounds. We have previously healed this wound on one occasion but it keeps on reopening [medial side] 6/10; deep tissue culture I did last week I think on the left medial wound showed both moderate E. coli and moderate staph aureus [MSSA]. She is going to require antibiotics and I have chosen Augmentin. We have been using Sorbact and we have made better looking wound surface on both sides but certainly no improvement in wound area. She was back in last Friday apparently for a dressing changes the wrap was hurting her outer left ankle. She has not managed to get a hold of vein and vascular in [place]. We are going to have to make her that  appointment 6/17; patient is tolerating the Augmentin. She had an MRI that I think was ordered by orthopedic surgeon this did not show osteomyelitis or an abscess did suggest cellulitis. We have been using Sorbact to the lateral and medial ankles. We have been trying to arrange a follow-up appointment with vein and vascular in [employer] or did her original ablations. We apparently an area sent the request to vein and vascular in [facility] 6/24; patient has completed the Augmentin. We do not yet have a vein and vascular appointment in [place]. I am not sure what the issue is here we have asked her to call tomorrow. We are using Sorbact. Making some improvements and especially the medial wound. Both surfaces however look better medial and lateral. 7/1; the patient has been in contact with vein and vascular in [place] but has not yet received an appointment. Using Sorbact we have gradually improve the wound surface with no improvement in surface area. She is approved for Apligraf but the wound surface still is not completely viable. She has not had to come in for a dressing change 7/8; the patient has an appointment with vein and vascular on 7/31 which is a Friday afternoon. She is concerned about getting back here for [place] to dress her wounds. I think it is important to have them goal for her venous reflux/history of ablations etc. to see if anything else can be done. She apparently tested positive for 1 of the blood tests with regards to lupus and saw a rheumatologist. He has raised the issue of vasculitis again. I have had this thought in the past however the evidence seems overwhelming that this is a venous reflux etiology. If the rheumatologist tells me there is clinical and laboratory investigation is positive for lupus I will rethink this. 7/15; the patient's wound surfaces are quite a bit better. The medial area which was her original wound now has no depth although the lateral wound  which was the more recent area actually appears larger. Both with viable surfaces which is indeed better. Using Sorbact. I wanted to use Apligraf on her however there is the issue of the vein and vascular appointment on 7/31 at 2:00 in the afternoon which would not allow her to get back to be rewrapped and they would no doubt remove the graft 7/22; the patient's wound surfaces have moderate amount of debris although generally look better. The lateral one is larger with 2 small satellite areas superiorly. We are waiting for her vein and vascular appointment on 7/31. She has been approved for Apligraf which I would like to use after th 7/29; wound surfaces have improved no debridement is required we have  been using Sorbact. She sees vein and vascular on Friday with this so question of whether anything can be done to lessen the likelihood of recurrence and/or speed the healing of these areas. She is already had previous ablations. She no doubt has severe venous hypertension 8/5-Patient returns at 1 week, she was in [place] for 3 days by her podiatrist, we have been using so backed to the wound, she has increased pain in both the wounds on the left lower leg especially the more distal one on the lateral aspect 8/12-Patient returns at 1 week and she is agreeable to having debridement in both wounds on her left leg today. We have been using Sorbact, and vascular studies were reviewed at last visit 8/19; the patient arrives with her wounds fairly clean and no debridement is required. We have used Sorbact which is really done a nice job in cleaning up these very difficult wound surfaces. The patient saw Dr. Donzetta Matters of vascular surgery on 7/31. He did not feel that there was an arterial component. He felt that her treated greater saphenous vein is adequately addressed and that the small saphenous vein did not appear to be involved significantly. She was also noted to have deep venous reflux which is not  treatable. Dr. Donzetta Matters mentioned the possibility of a central obstructive component leading to reflux and he offered her central venography. She wanted to discuss this or think about it. I have urged her to go ahead with this. She has had recurrent difficult wounds in these areas which do heal but after months in the clinic. If there is anything that can be done to reduce the likelihood of this I think it is worth it. 9/2 she is still working towards getting follow-up with Dr. Donzetta Matters to schedule her CT. Things are quite a bit worse venography. I put Apligraf on 2 weeks ago on both wounds on the medial and lateral part of her left lower leg. She arrives in clinic today with 3 superficial additional wounds above the area laterally and one below the wound medially. She describes a lot of discomfort. I think these are probably wrapped injuries. Does not look like she has cellulitis. 07/20/2019 on evaluation today patient appears to be doing somewhat poorly in regard to her lower extremity ulcers. She in fact showed signs of erythema in fact we may even be dealing with an infection at this time. Unfortunately I am unsure if this is just infection or if indeed there may be some allergic reaction that occurred as a result of the Apligraf application. With that being said that would be unusual but nonetheless not impossible in this patient is one who is unfortunately allergic to quite a bit. Currently we have been using the Sorbact which seems to do as well as anything for her. I do think we may want to obtain [name], [name] a culture today to see if there is anything showing up there that may need to be addressed. 9/16; noted that last week the wounds look worse in 1 week follow-up of the Apligraf. Using Sorbact as of 2 days ago. She arrives with copious amounts of drainage and new skin breakdown on the back of the left calf. The wounds arm more substantial bilaterally. There is a fair amount of  swelling in the left calf no overt DVT there is edema present I think in the left greater than right thigh. She is supposed to go on 9/28 for CT venography. The wounds on  the medial and lateral calf are worse and she has new skin breakdown posteriorly at least new for me. This is almost developing into a circumferential wound area The Apligraf was taken off last week which I agree with things are not going in the right direction a culture was done we do not have that back yet. She is on Augmentin that she started 2 days ago 9/23; dressing was changed by her nurses on Monday. In general there is no improvement in the wound areas although the area looks less angry than last week. She did get Augmentin for MSSA cultured on the 14th. She still appears to have too much swelling in the left leg even with 3 layer compression 9/30; the patient underwent her procedure on 9/28 by Dr. Donzetta Matters at vascular and vein specialist. She was discovered to have the common iliac vein measuring 12.2 mm but at the level of L4-L5 measured 3 mm. After stenting it measured 10 mm. 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 Rouleaux flow in the common femoral and femoral vein was observed much improved after stenting. We are using silver alginate to the wounds on the medial and lateral ankle on the left. 4 layer compression 10/7; the patient had fluid swelling around her knee and 4 layer compression. At the advice of vein and vascular this was reduced to 3 layer which she is tolerating better. We have been using silver alginate under 3 layer compression since last Friday 10/14; arrives with the areas on the left ankle looking a lot better. Inflammation in the area also a lot better. She came in for a nurse check on 10/9 10/21; continued nice improvement. Slight improvements in surface area of both the medial and lateral wounds on the left. A lot of the satellite lesions in the weeping erythema around these from  stasis dermatitis is resolved. We have been using silver alginate 10/28; general improvement in the entire wound areas although not a lot of change in dimensions the wound certainly looks better. There is a lot less in terms of venous inflammation. Continue silver alginate this week however look towards Hydrofera Blue next week 11/4; very adherent debris on the medial wound left wound is not as bad. We have been using silver alginate. Change to [facility] today 11/11; very adherent debris on both wound areas. She went to vein and vascular last week and follow-up they put in [place] boot on this today. He says the [facility] was adherent. Wound is definitely not as good as last week. Especially on the left there the satellite lesions look more prominent 11/18; absolutely no better. erythema on lateral aspect with tenderness. 09/30/2019 on evaluation today patient appears to actually be doing better. Dr. Dellia Nims did put her on doxycycline last week which I do believe has helped her at this point. Fortunately there is no signs of active infection at this time. No fevers, chills, nausea, vomiting, or diarrhea. I do believe he may want extend the doxycycline for 7 additional days just to ensure everything does completely cleared up the patient is in agreement with that plan. Otherwise she is going require some sharp debridement today 12/2; patient is completing a 2-week course of doxycycline. I gave her this empirically for inflammation as well as infection when I last saw her 2 weeks ago. All of this seems to be better. She is using silver alginate she has the area on the medial aspect of the larger area laterally and the  2 small satellite regions laterally above the major wound. 12/9; the patient's wound on the left medial and left lateral calf look really quite good. We have been using silver alginate. She saw vein and vascular in follow-up on 10/09/2019. She has had a previous left greater  saphenous vein ablation by Dr. Oscar La in 2016. More recently she underwent a left common iliac vein stent by Dr. 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 The swelling is improved and certainly the wounds have improved. The patient shows [place] today area on the right medial calf there is almost no wound but leaking lymphedema. She says she start this started 3 or 4 days ago. She did not traumatize it. It is not painful. She does not wear compression on that side 12/16; the patient continues to do well laterally. Medially still requiring debridement. The area on the right calf did not materialize to anything and is not currently open. We wrapped this last time. She has support stockings for that leg although I am not sure they are going to provide adequate compression 12/23; the lateral wound looks stable. Medially still requiring debridement for tightly adherent fibrinous debris. We've been using silver alginate. Surface area not any different 12/30; neither wound is any better with regards to surface and the area on the left lateral is larger. I been using silver alginate to the left lateral which look quite good last week and Sorbact to the left medial ZALIAH, FELDHAUS ([id number]) 11/11/2019. Lateral wound area actually looks better and somewhat smaller. Medial still requires a very aggressive debridement today. We have been using Sorbact on both wound areas Objective Constitutional Patient is hypertensive.. Pulse regular and within target range for patient.Marland Kitchen Respirations regular, non-labored and within target range.. Temperature is normal and within the target range for the patient.Marland Kitchen appears in no distress. Vitals Time Taken: 12:45 PM, Height: 63 in, Weight: 224.7 lbs, BMI: 39.8, Temperature: 98.2 F, Pulse: 69 bpm, Respiratory Rate: 16 breaths/min, Blood Pressure: 141/65 mmHg. Cardiovascular Pedal pulses palpable on the left. General Notes: Wound exam; the area on the lateral ankle  appears to be healthier and somewhat smaller per our intake measurements. The area on the medial ankle still has very adherent debris. This required a an aggressive debridement removing tightly adherent fibrinous debris. Hemostasis with direct pressure Integumentary (Hair, Skin) Wound #5 status is Open. Original cause of wound was Gradually Appeared. The wound is located on the Left,Medial Lower Leg. The wound measures 2.4cm length x 2.4cm width x 0.2cm depth; 4.524cm^2 area and 0.905cm^3 volume. There is Fat Layer (Subcutaneous Tissue) Exposed exposed. There is no tunneling or undermining noted. There is a large amount of serosanguineous drainage noted. The wound margin is flat and intact. There is medium (34-66%) pale granulation within the wound bed. There is a medium (34-66%) amount of necrotic tissue within the wound bed including Adherent Slough. Wound #6 status is Open. Original cause of wound was Gradually Appeared. The wound is located on the Left,Lateral Lower Leg. The wound measures 4.2cm length x 2.9cm width x 0.3cm depth; 9.566cm^2 area and 2.87cm^3 volume. There is Fat Layer (Subcutaneous Tissue) Exposed exposed. There is a large amount of serosanguineous drainage noted. The wound margin is flat and intact. There is medium (34-66%) pink granulation within the wound bed. There is a medium (34-66%) amount of necrotic tissue within the wound bed including Adherent Slough. Wound #9 status is Open. Original cause of wound was Gradually Appeared. The wound is located on  the Left,Lateral,Posterior Lower Leg. The wound measures 0.7cm length x 0.5cm width x 0.2cm depth; 0.275cm^2 area and 0.055cm^3 volume. There is Fat Layer (Subcutaneous Tissue) Exposed exposed. There is a large amount of serosanguineous drainage noted. The wound margin is flat and intact. There is large (67-100%) pink granulation within the wound bed. There is a small (1-33%) amount of necrotic tissue within the wound bed  including Adherent Slough. Assessment Active Problems ICD-10 Non-pressure chronic ulcer of left calf limited to breakdown of skin Chronic venous hypertension (idiopathic) with inflammation of right lower extremity EVYANA, OESTREICHER. ([id number]) Lymphedema, not elsewhere classified Non-pressure chronic ulcer of other part of right lower leg with other specified severity Procedures Wound #5 Pre-procedure diagnosis of Wound #5 is a Lymphedema located on the Left,Medial Lower Leg . There was a Excisional Skin/Subcutaneous Tissue Debridement with a total area of 5.76 sq cm performed by [name] Dillon, MD. With the following instrument(s): Curette to remove Viable tissue/material. Material removed includes Subcutaneous Tissue and Slough and after achieving pain control using Lidocaine. A time out was conducted at 12:59, prior to the start of the procedure. A Minimum amount of bleeding was controlled with Pressure. The procedure was tolerated well. Post Debridement Measurements: 2.4cm length x 2.4cm width x 0.3cm depth; 1.357cm^3 volume. Character of Wound/Ulcer Post Debridement requires further debridement. Post procedure Diagnosis Wound #5: Same as Pre-Procedure Plan Wound Cleansing: Wound #5 Left,Medial Lower Leg: Cleanse wound with mild soap and water Wound #6 Left,Lateral Lower Leg: Cleanse wound with mild soap and water Wound #9 Left,Lateral,Posterior Lower Leg: Cleanse wound with mild soap and water Anesthetic (add to Medication List): Wound #5 Left,Medial Lower Leg: Topical Lidocaine 4% cream applied to wound bed prior to debridement (In Clinic Only). Wound #6 Left,Lateral Lower Leg: Topical Lidocaine 4% cream applied to wound bed prior to debridement (In Clinic Only). Wound #9 Left,Lateral,Posterior Lower Leg: Topical Lidocaine 4% cream applied to wound bed prior to debridement (In Clinic Only). Skin Barriers/Peri-Wound Care: Wound #5 Left,Medial Lower Leg: Triamcinolone  Acetonide Ointment (TCA) Wound #6 Left,Lateral Lower Leg: Triamcinolone Acetonide Ointment (TCA) Wound #9 Left,Lateral,Posterior Lower Leg: Triamcinolone Acetonide Ointment (TCA) Primary Wound Dressing: Wound #5 Left,Medial Lower Leg: Other: - sorbact Wound #6 Left,Lateral Lower Leg: Other: - sorbact Wound #9 Left,Lateral,Posterior Lower Leg: Other: - sorbact Secondary Dressing: Wound #5 Left,Medial Lower Leg: Drawtex [name], [name]. ([id number]) Wound #6 Left,Lateral Lower Leg: Drawtex Wound #9 Left,Lateral,Posterior Lower Leg: Drawtex Dressing Change Frequency: Wound #5 Left,Medial Lower Leg: Change dressing every week Other: - as needed. Wound #6 Left,Lateral Lower Leg: Change dressing every week Other: - as needed. Wound #9 Left,Lateral,Posterior Lower Leg: Change dressing every week Other: - as needed. Follow-up Appointments: Wound #5 Left,Medial Lower Leg: Return Appointment in 1 week. Nurse Visit as needed - Call of needed Wound #6 Left,Lateral Lower Leg: Return Appointment in 1 week. Nurse Visit as needed - Call of needed Wound #9 Left,Lateral,Posterior Lower Leg: Return Appointment in 1 week. Nurse Visit as needed - Call of needed Edema Control: Wound #5 Left,Medial Lower Leg: 3 Layer Compression System - Left Lower Extremity - unna to anchor Wound #6 Left,Lateral Lower Leg: 3 Layer Compression System - Left Lower Extremity - unna to anchor Wound #9 Left,Lateral,Posterior Lower Leg: 3 Layer Compression System - Left Lower Extremity - unna to anchor 1. Aggressive debridement as noted 2. Continue Sorbac to both wound areas this appears to be helping the lateral wound 3. We have never been close to getting both  of these wound areas to a healthy looking surface Electronic Signature(s) Signed: 11/13/2019 1:19:33 PM By: [name], [name], RN, CWS, [name], [name] Signed: 11/18/2019 12:52:17 PM By: Linton Ham MD Previous Signature: 11/11/2019 4:45:14 PM Version By: Linton Ham MD Entered By: [name], [name], RN, CWS, Kim on 11/13/2019 13:19:32 Olicia, Guglielmetti [name] -------------------------------------------------------------------------------- SuperBill Details Patient Name: BARNETTA, BEVIER. Date of Service: 11/11/2019 Medical Record Number: [id number] Patient Account Number: [id number] Date of Birth/Sex: 03/05/1946 (73 y.o. 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

## 2019-11-11 NOTE — Progress Notes (Signed)
SHAELIN, FREGEAU ([id number]) Visit Report for 10/28/2019 Arrival Information Details Patient Name: [name], [name]. Date of Service: 10/28/2019 12:45 PM Medical Record Number: [id number] Patient Account Number: [id number] Date of Birth/Sex: Jul 16, 1946 (74 y.o. F) Treating RN: Cornell Barman Primary Care Johnatan Baskette: [name] Other Clinician: Referring Ralphine Hinks: [name] Treating Gevork Ayyad/Extender: Tito Dine in Treatment: 75 Visit Information History Since Last Visit Added or deleted any medications: No Patient Arrived: Ambulatory Any new allergies or adverse reactions: No Arrival Time: 12:54 Had a fall or experienced change in No Accompanied By: self activities of daily living that may affect Transfer Assistance: None risk of falls: Patient Identification Verified: Yes Signs or symptoms of abuse/neglect since last visito No Secondary Verification Process Yes Hospitalized since last visit: No Completed: Implantable device outside of the clinic excluding No Patient Requires Transmission-Based No cellular tissue based products placed in the center Precautions: since last visit: Patient Has Alerts: Yes Pain Present Now: No Patient Alerts: Patient on Blood Thinner aspirin 81 Electronic Signature(s) Signed: 10/28/2019 4:03:03 PM By: Lorine Bears RCP, RRT, CHT Entered By: Lorine Bears on 10/28/2019 12:56:20 Blalock, [name] ([id number]) -------------------------------------------------------------------------------- Encounter Discharge Information Details Patient Name: [name], [name]. Date of Service: 10/28/2019 12:45 PM Medical Record Number: [id number] Patient Account Number: [id number] Date of Birth/Sex: [birth year] (74 y.o. F) Treating RN: Cornell Barman Primary Care Kha Hari: [name] Other Clinician: Referring Duncan Alejandro: [name] Treating Dianca Owensby/Extender: Tito Dine in Treatment:  74 Encounter Discharge Information Items Post Procedure Vitals Discharge Condition: Stable Temperature (F): 98.2 Ambulatory Status: Ambulatory Pulse (bpm): 89 Discharge Destination: Home Respiratory Rate (breaths/min): 16 Transportation: Private Auto Blood Pressure (mmHg): 131/72 Accompanied By: self Schedule Follow-up Appointment: Yes Clinical Summary of Care: Electronic Signature(s) Signed: 11/11/2019 4:51:46 PM By: [name], [name], RN, CWS, [name], [name] Entered By: [name], [name], RN, CWS, Kim on 10/28/2019 13:30:19 [name] ([id number]) -------------------------------------------------------------------------------- Lower Extremity Assessment Details Patient Name: [name], [name]. Date of Service: 10/28/2019 12:45 PM Medical Record Number: [id number] Patient Account Number: [id number] Date of Birth/Sex: Apr 13, 1946 (74 y.o. F) Treating RN: Army Melia Primary Care Blakleigh Straw: [name] Other Clinician: Referring Partick Musselman: [name] Treating Haywood Meinders/Extender: Tito Dine in Treatment: 46 Edema Assessment Assessed: [Left: No] [Right: No] Edema: [Left: N] [Right: o] Calf Left: Right: Point of Measurement: 33 cm From Medial Instep 40 cm cm Ankle Left: Right: Point of Measurement: 10 cm From Medial Instep 23 cm cm Vascular Assessment Pulses: Dorsalis Pedis Palpable: [Left:Yes] Electronic Signature(s) Signed: 10/28/2019 4:11:28 PM By: Army Melia Entered By: Army Melia on 10/28/2019 13:03:50 Huang, [name] ([id number]) -------------------------------------------------------------------------------- Multi Wound Chart Details Patient Name: [name], [name]. Date of Service: 10/28/2019 12:45 PM Medical Record Number: [id number] Patient Account Number: [id number] Date of Birth/Sex: [birth year] (74 y.o. F) Treating RN: Cornell Barman Primary Care Leslieann Whisman: [name] Other Clinician: Referring Wilmon Conover: [name] Treating Rosely Fernandez/Extender:  Tito Dine in Treatment: 53 Vital Signs Height(in): 63 Pulse(bpm): 41 Weight(lbs): 224.7 Blood Pressure(mmHg): 131/72 Body Mass Index(BMI): 40 Temperature(F): 98.2 Respiratory Rate 16 (breaths/min): Photos: Wound Location: Left Lower Leg - Medial Left Lower Leg - Lateral Left Lower Leg - Lateral, Posterior Wounding Event: Gradually Appeared Gradually Appeared Gradually Appeared Primary Etiology: Lymphedema Venous Leg Ulcer Venous Leg Ulcer Comorbid History: Cataracts, Asthma, Sleep Cataracts, Asthma, Sleep Cataracts, Asthma, Sleep Apnea, Deep Vein Apnea, Deep Vein Apnea, Deep Vein Thrombosis, Hypertension, Thrombosis, Hypertension, Thrombosis, Hypertension, Peripheral Venous Disease, Peripheral Venous Disease, Peripheral Venous Disease, Osteoarthritis, Received Osteoarthritis, Received  Osteoarthritis, Received Chemotherapy, Received Chemotherapy, Received Chemotherapy, Received Radiation Radiation Radiation Date Acquired: 11/19/2018 01/19/2019 07/08/2019 Weeks of Treatment: 46 40 16 Wound Status: Open Open Open Measurements L x W x D 2.6x2.4x0.2 2.8x2.5x0.2 0.8x0.5x0.1 (cm) Area (cm) : 4.901 5.498 0.314 Volume (cm) : 0.98 1.1 0.031 % Reduction in Area: 42.60% -2399.10% 44.40% % Reduction in Volume: -14.60% -4900.00% 45.60% Classification: Full Thickness Without Full Thickness Without Full Thickness Without Exposed Support Structures Exposed Support Structures Exposed Support Structures Exudate Amount: Large Large Large Exudate Type: Serosanguineous Serosanguineous Serosanguineous Exudate Color: red, brown red, brown red, brown Wound Margin: Flat and Intact Flat and Intact Flat and Intact Granulation Amount: Medium (34-66%) Medium (34-66%) Large (67-100%) Granulation Quality: Pale Pink Pink Necrotic Amount: Medium (34-66%) Medium (34-66%) Small (1-33%) Flam, Vaughn J. ([id number]) Exposed Structures: Fat Layer (Subcutaneous Fat Layer (Subcutaneous Fat Layer  (Subcutaneous Tissue) Exposed: Yes Tissue) Exposed: Yes Tissue) Exposed: Yes Fascia: No Fascia: No Fascia: No Tendon: No Tendon: No Tendon: No Muscle: No Muscle: No Muscle: No Joint: No Joint: No Joint: No Bone: No Bone: No Bone: No Epithelialization: None None None Debridement: Debridement - Excisional N/A N/A Pre-procedure 13:23 N/A N/A Verification/Time Out Taken: Pain Control: Lidocaine N/A N/A Tissue Debrided: Subcutaneous, Slough N/A N/A Level: Skin/Subcutaneous Tissue N/A N/A Debridement Area (sq cm): 6.24 N/A N/A Instrument: Curette N/A N/A Bleeding: Moderate N/A N/A Hemostasis Achieved: Pressure N/A N/A Debridement Treatment Procedure was tolerated well N/A N/A Response: Post Debridement 2.6x2.4x0.3 N/A N/A Measurements L x W x D (cm) Post Debridement Volume: 1.47 N/A N/A (cm) Procedures Performed: Debridement N/A N/A Treatment Notes Wound #5 (Left, Medial Lower Leg) Notes TCA, lateral Adaptic, Silver alginate, medial Sorbact, 3 layer, unna to anchor Wound #6 (Left, Lateral Lower Leg) Notes TCA, lateral Adaptic, Silver alginate, medial Sorbact, 3 layer, unna to anchor Wound #9 (Left, Lateral, Posterior Lower Leg) Notes TCA, lateral Adaptic, Silver alginate, medial Sorbact, 3 layer, unna to anchor Electronic Signature(s) Signed: 10/28/2019 5:48:31 PM By: Linton Ham MD Entered By: Linton Ham on 10/28/2019 13:30:44 Erekson, [name] ([id number]) -------------------------------------------------------------------------------- Multi-Disciplinary Care Plan Details Patient Name: VANESSA, CUFF. Date of Service: 10/28/2019 12:45 PM Medical Record Number: [id number] Patient Account Number: [id number] Date of Birth/Sex: [birth year] (74 y.o. F) Treating RN: Cornell Barman Primary Care [name] Furuya: [name] Other Clinician: Referring Kavan Devan: [name] Treating Lakeem Rozo/Extender: Tito Dine in Treatment: 3 Active  Inactive Orientation to the Wound Care Program Nursing Diagnoses: Knowledge deficit related to the wound healing center program Goals: Patient/caregiver will verbalize understanding of the [facility] Program Date Initiated: 12/10/2018 Target Resolution Date: 01/09/2019 Goal Status: Active Interventions: Provide education on orientation to the wound center Notes: Soft Tissue Infection Nursing Diagnoses: Impaired tissue integrity Goals: Patient's soft tissue infection will resolve Date Initiated: 12/10/2018 Target Resolution Date: 01/09/2019 Goal Status: Active Interventions: Assess signs and symptoms of infection every visit Notes: Venous Leg Ulcer Nursing Diagnoses: Actual venous Insuffiency (use after diagnosis is confirmed) Goals: Patient will maintain optimal edema control Date Initiated: 12/10/2018 Target Resolution Date: 01/09/2019 Goal Status: Active Interventions: Assess peripheral edema status every visit. NIAJA, BAIN ([id number]) Treatment Activities: Therapeutic compression applied : 12/10/2018 Notes: Wound/Skin Impairment Nursing Diagnoses: Impaired tissue integrity Goals: Patient/caregiver will verbalize understanding of skin care regimen Date Initiated: 12/10/2018 Target Resolution Date: 01/09/2019 Goal Status: Active Interventions: Assess ulceration(s) every visit Treatment Activities: Topical wound management initiated : 12/10/2018 Notes: Electronic Signature(s) Signed: 11/11/2019 4:51:46 PM By: [name], [name], RN, CWS, [name], [name] Entered By:  [name], [name], RN, CWS, Kim on 10/28/2019 13:21:16 SHAVON, GLEBA ([id number]) -------------------------------------------------------------------------------- Pain Assessment Details Patient Name: ZYNIA, BORGMAN. Date of Service: 10/28/2019 12:45 PM Medical Record Number: [id number] Patient Account Number: [id number] Date of Birth/Sex: December 25, 1945 (74 y.o. F) Treating RN: Cornell Barman Primary Care Yesmin Mutch: [name] Other Clinician: Referring Haroun Cotham: [name] Treating Jailah Willis/Extender: Tito Dine in Treatment: 46 Active Problems Location of Pain Severity and Description of Pain Patient Has Paino No Site Locations Pain Management and Medication Current Pain Management: Electronic Signature(s) Signed: 10/28/2019 4:03:03 PM By: Paulla Fore, RRT, CHT Signed: 11/11/2019 4:51:46 PM By: [name], [name], RN, CWS, [name], [name] Entered By: Lorine Bears on 10/28/2019 12:56:29 ADDELYNNE, MENG ([id number]) -------------------------------------------------------------------------------- Patient/Caregiver Education Details Patient Name: SULEMA, SVETLIK. Date of Service: 10/28/2019 12:45 PM Medical Record Number: [id number] Patient Account Number: [id number] Date of Birth/Gender: [birth year] (74 y.o. F) Treating RN: Cornell Barman Primary Care Physician: [name] Other Clinician: Referring Physician: [name] Treating Physician/Extender: Tito Dine in Treatment: 13 Education Assessment Education Provided To: Patient Education Topics Provided Wound/Skin Impairment: Handouts: Caring for Your Ulcer Methods: Demonstration, Explain/Verbal Responses: State content correctly Electronic Signature(s) Signed: 11/11/2019 4:51:46 PM By: [name], [name], RN, CWS, [name], [name] Entered By: [name], [name], RN, CWS, Kim on 10/28/2019 13:29:05 [name] ([id number]) -------------------------------------------------------------------------------- Wound Assessment Details Patient Name: [name], [name]. Date of Service: 10/28/2019 12:45 PM Medical Record Number: [id number] Patient Account Number: [id number] Date of Birth/Sex: [birth year] (74 y.o. F) Treating RN: Army Melia Primary Care Delsin Copen: [name] Other Clinician: Referring Sladen Plancarte: [name] Treating Clarice Bonaventure/Extender: Tito Dine in Treatment: 46 Wound  Status Wound Number: 5 Primary Lymphedema Etiology: Wound Location: Left Lower Leg - Medial Wound Open Wounding Event: Gradually Appeared Status: Date Acquired: 11/19/2018 Comorbid Cataracts, Asthma, Sleep Apnea, Deep Vein Weeks Of Treatment: 46 History: Thrombosis, Hypertension, Peripheral Venous Clustered Wound: No Disease, Osteoarthritis, Received Chemotherapy, Received Radiation Photos Wound Measurements Length: (cm) 2.6 Width: (cm) 2.4 Depth: (cm) 0.2 Area: (cm) 4.901 Volume: (cm) 0.98 % Reduction in Area: 42.6% % Reduction in Volume: -14.6% Epithelialization: None Wound Description Full Thickness Without Exposed Support Classification: Structures Wound Margin: Flat and Intact Exudate Large Amount: Exudate Type: Serosanguineous Exudate Color: red, brown Foul Odor After Cleansing: No Slough/Fibrino Yes Wound Bed Granulation Amount: Medium (34-66%) Exposed Structure Granulation Quality: Pale Fascia Exposed: No Necrotic Amount: Medium (34-66%) Fat Layer (Subcutaneous Tissue) Exposed: Yes Necrotic Quality: Adherent Slough Tendon Exposed: No Muscle Exposed: No Joint Exposed: No Bone Exposed: No [name], [name] Electronic Signature(s) Signed: 10/28/2019 4:11:28 PM By: Army Melia Entered By: Army Melia on 10/28/2019 13:01:21 Kroeger, [name] ([id number]) -------------------------------------------------------------------------------- Wound Assessment Details Patient Name: [name], [name]. Date of Service: 10/28/2019 12:45 PM Medical Record Number: [id number] Patient Account Number: [id number] Date of Birth/Sex: 1946-01-19 (74 y.o. F) Treating RN: Army Melia Primary Care Brenden Rudman: [name] Other Clinician: Referring Jerren Flinchbaugh: [name] Treating Absalom Aro/Extender: Tito Dine in Treatment: 46 Wound Status Wound Number: 6 Primary Venous Leg Ulcer Etiology: Wound Location: Left Lower Leg - Lateral Wound  Open Wounding Event: Gradually Appeared Status: Date Acquired: 01/19/2019 Comorbid Cataracts, Asthma, Sleep Apnea, Deep Vein Weeks Of Treatment: 40 History: Thrombosis, Hypertension, Peripheral Venous Clustered Wound: No Disease, Osteoarthritis, Received Chemotherapy, Received Radiation Photos Wound Measurements Length: (cm) 2.8 Width: (cm) 2.5 Depth: (cm) 0.2 Area: (cm) 5.498 Volume: (cm) 1.1 % Reduction in Area: -2399.1% % Reduction in Volume: -4900% Epithelialization: None Wound  Description Full Thickness Without Exposed Support Classification: Structures Wound Margin: Flat and Intact Exudate Large Amount: Exudate Type: Serosanguineous Exudate Color: red, brown Foul Odor After Cleansing: No Slough/Fibrino Yes Wound Bed Granulation Amount: Medium (34-66%) Exposed Structure Granulation Quality: Pink Fascia Exposed: No Necrotic Amount: Medium (34-66%) Fat Layer (Subcutaneous Tissue) Exposed: Yes Necrotic Quality: Adherent Slough Tendon Exposed: No Muscle Exposed: No Joint Exposed: No Bone Exposed: No [name], [name] Electronic Signature(s) Signed: 10/28/2019 4:11:28 PM By: Army Melia Entered By: Army Melia on 10/28/2019 13:02:33 Fitzpatrick, [name] ([id number]) -------------------------------------------------------------------------------- Wound Assessment Details Patient Name: [name], [name]. Date of Service: 10/28/2019 12:45 PM Medical Record Number: [id number] Patient Account Number: [id number] Date of Birth/Sex: [birth year] (74 y.o. F) Treating RN: Army Melia Primary Care Clemons Salvucci: [name] Other Clinician: Referring Florabelle Cardin: [name] Treating Cederic Mozley/Extender: Tito Dine in Treatment: 46 Wound Status Wound Number: 9 Primary Venous Leg Ulcer Etiology: Wound Location: Left Lower Leg - Lateral, Posterior Wound Open Wounding Event: Gradually Appeared Status: Date Acquired: 07/08/2019 Comorbid Cataracts,  Asthma, Sleep Apnea, Deep Vein Weeks Of Treatment: 16 History: Thrombosis, Hypertension, Peripheral Venous Clustered Wound: No Disease, Osteoarthritis, Received Chemotherapy, Received Radiation Photos Wound Measurements Length: (cm) 0.8 Width: (cm) 0.5 Depth: (cm) 0.1 Area: (cm) 0.314 Volume: (cm) 0.031 % Reduction in Area: 44.4% % Reduction in Volume: 45.6% Epithelialization: None Wound Description Full Thickness Without Exposed Support Classification: Structures Wound Margin: Flat and Intact Exudate Large Amount: Exudate Type: Serosanguineous Exudate Color: red, brown Foul Odor After Cleansing: No Slough/Fibrino Yes Wound Bed Granulation Amount: Large (67-100%) Exposed Structure Granulation Quality: Pink Fascia Exposed: No Necrotic Amount: Small (1-33%) Fat Layer (Subcutaneous Tissue) Exposed: Yes Necrotic Quality: Adherent Slough Tendon Exposed: No Muscle Exposed: No Joint Exposed: No Bone Exposed: No [name], [name] Electronic Signature(s) Signed: 10/28/2019 4:11:28 PM By: Army Melia Entered By: Army Melia on 10/28/2019 13:03:03 Massman, [name] ([id number]) -------------------------------------------------------------------------------- Vitals Details Patient Name: [name], [name]. Date of Service: 10/28/2019 12:45 PM Medical Record Number: [id number] Patient Account Number: [id number] Date of Birth/Sex: February 13, 1946 (74 y.o. F) Treating RN: Cornell Barman Primary Care Vannary Greening: [name] Other Clinician: Referring Noni Stonesifer: [name] Treating Klea Nall/Extender: Tito Dine in Treatment: 46 Vital Signs Time Taken: 12:55 Temperature (F): 98.2 Height (in): 63 Pulse (bpm): 89 Weight (lbs): 224.7 Respiratory Rate (breaths/min): 16 Body Mass Index (BMI): 39.8 Blood Pressure (mmHg): 131/72 Reference Range: 80 - 120 mg / dl Electronic Signature(s) Signed: 10/28/2019 4:03:03 PM By: Lorine Bears RCP,  RRT, CHT Entered By: Lorine Bears on 10/28/2019 12:56:54

## 2019-11-11 NOTE — Progress Notes (Signed)
SHIAH, OAKEY ([id number]) Visit Report for 10/28/2019 Debridement Details Patient Name: [name], [name]. Date of Service: 10/28/2019 12:45 PM Medical Record Number: [id number] Patient Account Number: [id number] Date of Birth/Sex: [birth year] (74 y.o. F) Treating RN: Cornell Barman Primary Care Provider: [name] Other Clinician: Referring Provider: [name] Treating Provider/Extender: Tito Dine in Treatment: 46 Debridement Performed for Wound #5 Left,Medial Lower Leg Assessment: Performed By: Physician [name] Dillon, MD Debridement Type: Debridement Level of Consciousness (Pre- Awake and Alert procedure): Pre-procedure Verification/Time Yes - 13:23 Out Taken: Start Time: 13:23 Pain Control: Lidocaine Total Area Debrided (L x W): 2.6 (cm) x 2.4 (cm) = 6.24 (cm) Tissue and other material Viable, Non-Viable, Slough, Subcutaneous, Slough debrided: Level: Skin/Subcutaneous Tissue Debridement Description: Excisional Instrument: Curette Bleeding: Moderate Hemostasis Achieved: Pressure End Time: 13:23 Response to Treatment: Procedure was tolerated well Level of Consciousness Awake and Alert (Post-procedure): Post Debridement Measurements of Total Wound Length: (cm) 2.6 Width: (cm) 2.4 Depth: (cm) 0.3 Volume: (cm) 1.47 Character of Wound/Ulcer Post Debridement: Stable Post Procedure Diagnosis Same as Pre-procedure Electronic Signature(s) Signed: 10/28/2019 5:48:31 PM By: Linton Ham MD Signed: 11/11/2019 4:51:46 PM By: [name], [name], RN, CWS, [name], [name] Entered By: Linton Ham on 10/28/2019 13:31:15 [name], [name] -------------------------------------------------------------------------------- HPI Details Patient Name: [name], [name]. Date of Service: 10/28/2019 12:45 PM Medical Record Number: [id number] Patient Account Number: [id number] Date of Birth/Sex: [birth year] (74 y.o. F) Treating RN: Cornell Barman Primary Care  Provider: [name] Other Clinician: Referring Provider: [name] Treating Provider/Extender: Tito Dine in Treatment: 38 History of Present Illness HPI Description: Pleasant 74 year old with history of chronic venous insufficiency. No diabetes or peripheral vascular disease. Left ABI 1.29. Questionable history of left lower extremity DVT. She developed a recurrent ulceration on her left lateral calf in December 2015, which she attributes to poor diet and subsequent lower extremity edema. She underwent endovenous laser ablation of her left greater saphenous vein in 2010. She underwent laser ablation of accessory branch of left GSV in April 2016 by Dr. Kellie Simmering at [facility]. She was previously wearing Unna boots, which she tolerated well. Tolerating 2 layer compression and cadexomer iodine. She returns to clinic for follow-up and is without new complaints. She denies any significant pain at this time. She reports persistent pain with pressure. No claudication or ischemic rest pain. No fever or chills. No drainage. READMISSION 11/13/16; this is a 74 year old woman who is not a diabetic. She is here for a review of a painful area on her left medial lower extremity. I note that she was seen here previously last year for wound I believe to be in the same area. At that time she had undergone previously a left greater saphenous vein ablation by Dr. Kellie Simmering and she had a ablation of the anterior accessory branch of the left greater saphenous vein in March 2016. Seeing that the wound actually closed over. In reviewing the history with her today the ulcer in this area has been recurrent. She describes a biopsy of this area in 2009 that only showed stasis physiology. 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 Do not see that she has ever had arterial studies in the left leg. She tells me she has a follow-up with  Dr. Kellie Simmering in roughly 10 days In any case she developed the reopening of this area roughly a month ago. On the background of this she describes rapidly increasing edema which has responded to Lasix 40 mg and metolazone 2.5 mg as well as the patient's lymph massage. She has been told she has both venous insufficiency and lymphedema but she cannot tolerate compression stockings 11/28/16; the patient saw Dr. Kellie Simmering recently. Per the patient he did arterial Dopplers in the office that did not show evidence of arterial insufficiency, per the patient he stated "treat this like an ordinary venous ulcer". She also saw her dermatologist Dr. Ronnald Ramp who felt that this was more of a vascular ulcer. In general things are improving although she arrives today with increasing bilateral lower extremity edema with weeping a deeper fluid through the wound on the left medial leg compatible with some degree of lymphedema 12/04/16; the patient's wound is fully epithelialized but I don't think fully healed. We will do another week of depression with Promogran and TCA however I suspect we'll be able to discharge her next week. This is a very unusual-looking wound which was initially a figure-of-eight type wound lying on its side surrounded by petechial like hemorrhage. She has had venous ablation on this side. She apparently does not have an arterial issue per Dr. Kellie Simmering. She saw her dermatologist thought it was "vascular". Patient is definitely going to need ongoing compression and I talked about this with her today she will go to elastic therapy after she leaves here next week 12/11/16; the patient's wound is not completely closed today. She has surrounding scar tissue and in further discussion with the patient it would appear that she had ulcers in this area in 2009 for a  prolonged period of time ultimately requiring a punch biopsy of this area that only showed venous insufficiency. I did not previously pickup on this part of the history from the patient. 12/18/16; the patient's wound is completely epithelialized. There is no open area here. She has significant bilateral venous insufficiency with secondary lymphedema to a mild-to-moderate degree she does not have compression stockings.. She did not say anything to me when I was in the room, she told our intake nurse that she was still having pain in this area. This isn't unusual recurrent small open area. She is going to go to elastic therapy to obtain compression stockings. 12/25/16; the patient's wound is fully epithelialized. There is no open area here. The patient describes some continued episodic discomfort in this area medial left calf. However everything looks fine and healed here. She is been to elastic therapy and caught herself 15-20 mmHg stockings, they apparently were having trouble getting 20-30 mm stockings in her size [name], [name] 01/22/17; this is a patient we discharged from the clinic a month ago. She has a recurrent open wound on her medial left calf. She had 15 mm support stockings. I told her I thought she needed 20-30 mm compression stockings. She tells me that she has been ill with hospitalization secondary to asthma and is been found to have severe hypokalemia likely secondary to a combination of Lasix and metolazone. This morning she noted blistering and leaking fluid on the posterior part of her left leg. She called our intake nurse urgently and we was saw her this afternoon. She has not had any real discomfort here. I don't know that she's been wearing any stockings on this leg for at least 2-3 days. ABIs in this clinic  were 1.21 on the right and 1.3 on the left. She is previously seen vascular surgery who does not think that there is a peripheral arterial issue. 01/30/17;  Patient arrives with no open wound on the left leg. She has been to elastic therapy and obtained 20-39mmhg below knee stockings and she has one on the right leg today. READMISSION 02/19/18; this Hulslander is a now 74 year old patient we've had in this clinic perhaps 3 times before. I had last looked at her from January 07 December 2016 with an area on the medial left leg. We discharged her on 12/25/16 however she had to be readmitted on 01/22/17 with a recurrence. I have in my notes that we discharged her on 20-30 mm stockings although she tells me she was only wearing support hose because she cannot get stockings on predominantly related to her cervical spine surgery/issues. She has had previous ablations done by vein and vascular in [place] including a great saphenous vein ablation on the left with an anterior accessory branch ablation I think both of these were in 2016. On one of the previous visit she had a biopsy noted 2009 that was negative. She is not felt to have an arterial issue. She is not a diabetic. She does have a history of obstructive sleep apnea hypertension asthma as well as chronic venous insufficiency and lymphedema. On this occasion she noted 2 dry scaly patch on her left leg. She tried to put lotion on this it didn't really help. There were 2 open areas.the patient has been seeing her primary physician from 02/05/18 through 02/14/18. She had Unna boots applied. The superior wound now on the lateral left leg has closed but she's had one wound that remains open on the lateral left leg. This is not the same spot as we dealt with in 2018. ABIs in this clinic were 1.3 bilaterally 02/26/18; patient has a small wound on the left lateral calf. Dimensions are down. She has chronic venous insufficiency and lymphedema. 03/05/18; small open area on the left lateral calf. Dimensions are down. Tightly adherent necrotic debris over the surface of the wound which was difficult to remove. Also the  dressing [over collagen] stuck to the wound surface. This was removed with some difficulty as well. Change the primary dressing to Hydrofera Blue ready 03/12/18; small open area on the left lateral calf. Comes in with tightly adherent surface eschar as well as some adherent Hydrofera Blue. 03/19/18; open area on the left lateral calf. Again adherent surface eschar as well as some adherent Hydrofera Blue nonviable subcutaneous tissue. She complained of pain all week even with the reduction from 4-3 layer compression I put on last week. Also she had an increase in her ankle and calf measurements probably related to the same thing. 03/26/18; open area on the left lateral calf. A very small open area remains here. We used silver alginate starting last week as the Hydrofera Blue seem to stick to the wound bed. In using 4-layer compression 04/02/18; the open area in the left lateral calf at some adherent slough which I removed there is no open area here. We are able to transition her into her own compression stocking. Truthfully I think this is probably his support hose. However this does not maintain skin integrity will be limited. She cannot put over the toe compression stockings on because of neck problems hand problems etc. She is allergic to the lining layer of juxta lites. We might be forced to use extremitease stocking should  this fail READMIT 11/24/2018 Patient is now a 74 year old woman who is not a diabetic. She has been in this clinic on at least 3 previous occasions largely with recurrent wounds on her left leg secondary to chronic venous insufficiency with secondary lymphedema. Her situation is complicated by inability to get stockings on and an allergy to neoprene which is apparently a component and at least juxta lites and other stockings. As a result she really has not been wearing any stockings on her legs. She tells [place] that roughly 2 or 3 weeks ago she started noticing a stinging sensation  just above her ankle on the left medial aspect. She has been diagnosed with pseudogout and she wondered whether this was what she was experiencing. She tried to dress this with something she bought at the store however subsequently it pulled skin off and now she has an open wound that is not improving. She has been using Vaseline gauze with a cover bandage. She saw her primary doctor last week who put an Haematologist on her. ABIs in this clinic was 1.03 on the left [name], [name]. ([id number]) 2/12; the area is on the left medial ankle. Odd-looking wound with what looks to be surface epithelialization but a multitude of small petechial openings. This clearly not closed yet. We have been using silver alginate under 3 layer compression with TCA 2/19; the wound area did not look quite as good this week. Necrotic debris over the majority of the wound surface which required debridement. She continues to have a multitude of what looked to be small petechial openings. She reminds [place] that she had a biopsy on this initially during her first outbreak in 2015 in [place] dermatology. She expresses concern about this being a possible melanoma. 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 I assured her that this does not look anything like melanoma. Except for the petechial reaction it does look like a venous insufficiency area and she certainly has evidence of this on both sides 2/26; a difficult area on the left medial ankle. The patient clearly has chronic venous hypertension with some degree of lymphedema. The odd thing about the area is the small petechial hemorrhages. I am not really sure how to explain this. This was present last time and this is not a compression injury. We have been using Hydrofera Blue which I changed to last week 3/4; still using Hydrofera Blue. Aggressive debridement today. She does not have known  arterial issues. She has seen Dr. Kellie Simmering at [facility] vein and vascular and and has an ablation on the left. [Anterior accessory branch of the greater saphenous]. From what I remember they did not feel she had an arterial issue. The patient has had this area biopsied in 2009 at [facility] dermatology and by her recollection they said this was "stasis". She is also follow-up with dermatology locally who thought that this was more of a vascular issue 3/11; using Hydrofera Blue. Aggressive debridement today. She does not have an arterial issue. We are using 3 layer compression although we may need to go to 4. The patient has been in for multiple changes to her wrap since I last saw her a week ago. She says that the area was leaking. I do not have too much more information on what was found 01/19/19 on evaluation today patient was actually being seen for a nurse visit when unfortunately she had the area on  her left lateral lower extremity as well as weeping from the right lower extremity that became apparent. Therefore we did end up actually seeing her for a full visit with myself. She is having some pain at this site as well but fortunately nothing too significant at this point. No fevers, chills, nausea, or vomiting noted at this time. 3/18-Patient is back to the clinic with the left leg venous leg ulcer, the ulcer is larger in size, has a surface that is densely adherent with fibrinous tissue, the Hydrofera Blue was used but is densely adherent and there was difficulty in removing it. The right lower extremity was also wrapped for weeping edema. Patient has a new area over the left lateral foot above the malleolus that is small and appears to have no debris with intact surrounding skin. Patient is on increased dose of Lasix also as a means to edema management 3/25; the patient has a nonhealing venous ulcer on the medial left leg and last week developed a smaller area on the lateral left calf. We have  been using Hydrofera Blue with a contact layer. 4/1; no major change in these wounds areas. Left medial and more recently left lateral calf. I tried Iodoflex last week to aid in debridement she did not tolerate this. She stated her pain was terrible all week. She took the top layer of the 4 layer compression off. 4/8; the patient actually looks somewhat better in terms of her more prominent left lateral calf wound. There is some healthy looking tissue here. She is still complaining of a lot of discomfort. 4/15; patient in a lot of pain secondary to sciatica. She is on a prednisone taper prescribed by her primary physician. She has the 2 areas one on the left medial and more recently a smaller area on the left lateral calf. Both of these just above the malleoli 4/22; her back pain is better but she still states she is very uncomfortable and now feels she is intolerant to the [employer]. No real change in the wounds we have been using Sorbact. She has been previously intolerant to Iodoflex. There is not a lot of option about what we can use to debride this wound under compression that she no doubt needs. sHe states Ultram no longer works for her pain 4/29; no major change in the wounds slightly increased depth. Surface on the original medial wound perhaps somewhat improved however the more recent area on the lateral left ankle is 100% covered in very adherent debris we have been using Sorbact. She tolerates 4 layer compression well and her edema control is a lot better. She has not had to come in for a nurse check 5/6; no major change in the condition of the wounds. She did consent to debridement today which was done with some difficulty. Continuing Sorbact. She did not tolerate Iodoflex. She was in for a check of her compression the day after we wrapped her last week this was adjusted but nothing much was found 5/13; no major change in the condition or area of the wounds. I was able to get a fairly  aggressive debridement done on the lateral left leg wound. Even using Sorbact under compression. She came back in on Friday to have the wrap changed. She says she felt uncomfortable on the lateral aspect of her ankle. She has a long history of chronic venous insufficiency including previous ablation surgery on this side. 5/20-Patient returns for wounds on left leg with both wounds covered in  slough, with the lateral leg wound larger in size, she has been in 3 layer compression and felt more comfortable, she describes pain in ankle, in leg and pins and needles in foot, and is about to try Pamelor for this 6/3; wounds on the left lateral and left medial leg. The area medially which is the most recent of the 2 seems to have had the largest increase in dimensions. We have been using Sorbac to try and debride the surface. She has been to see orthopedics they apparently did a plain x-ray that was indeterminant. Diagnosed her with neuropathy and they have ordered an MRI to ALAYTHIA, DICKERT. ([id number]) determine if there is underlying osteomyelitis. This was not high on my thought list but I suppose it is prudent. We have advised her to make an appointment with vein and vascular in [place]. She has a history of a left greater saphenous and accessory vein ablations I wonder if there is anything else that can be done from a surgical point of view to help in these difficult refractory wounds. We have previously healed this wound on one occasion but it keeps on reopening [medial side] 6/10; deep tissue culture I did last week I think on the left medial wound showed both moderate E. coli and moderate staph aureus [MSSA]. She is going to require antibiotics and I have chosen Augmentin. We have been using Sorbact and we have made better looking wound surface on both sides but certainly no improvement in wound area. She was back in last Friday apparently for a dressing changes the wrap was hurting her outer  left ankle. She has not managed to get a hold of vein and vascular in [place]. We are going to have to make her that appointment 6/17; patient is tolerating the Augmentin. She had an MRI that I think was ordered by orthopedic surgeon this did not show osteomyelitis or an abscess did suggest cellulitis. We have been using Sorbact to the lateral and medial ankles. We have been trying to arrange a follow-up appointment with vein and vascular in [place] or did her original ablations. We apparently an area sent the request to vein and vascular in [facility] 6/24; patient has completed the Augmentin. We do not yet have a vein and vascular appointment in [place]. I am not sure what the issue is here we have asked her to call tomorrow. We are using Sorbact. Making some improvements and especially the medial wound. Both surfaces however look better medial and lateral. 7/1; the patient has been in contact with vein and vascular in [place] but has not yet received an appointment. Using Sorbact we have gradually improve the wound surface with no improvement in surface area. She is approved for Apligraf but the wound surface still is not completely viable. She has not had to come in for a dressing change 7/8; the patient has an appointment with vein and vascular on 7/31 which is a Friday afternoon. She is concerned about getting back here for [place] to dress her wounds. I think it is important to have them goal for her venous reflux/history of ablations etc. to see if anything else can be done. She apparently tested positive for 1 of the blood tests with regards to lupus and saw a rheumatologist. He has raised the issue of vasculitis again. I have had this thought in the past however the evidence seems overwhelming that this is a venous reflux etiology. If the rheumatologist tells me there is clinical and laboratory investigation  is positive for lupus I will rethink this. 7/15; the patient's wound  surfaces are quite a bit better. The medial area which was her original wound now has no depth although the lateral wound which was the more recent area actually appears larger. Both with viable surfaces which is indeed better. Using Sorbact. I wanted to use Apligraf on her however there is the issue of the vein and vascular appointment on 7/31 at 2:00 in the afternoon which would not allow her to get back to be rewrapped and they would no doubt remove the graft 7/22; the patient's wound surfaces have moderate amount of debris although generally look better. The lateral one is larger with 2 small satellite areas superiorly. We are waiting for her vein and vascular appointment on 7/31. She has been approved for Apligraf which I would like to use after th 7/29; wound surfaces have improved no debridement is required we have been using Sorbact. She sees vein and vascular on Friday with this so question of whether anything can be done to lessen the likelihood of recurrence and/or speed the healing of these areas. She is already had previous ablations. She no doubt has severe venous hypertension 8/5-Patient returns at 1 week, she was in [place] for 3 days by her podiatrist, we have been using so backed to the wound, she has increased pain in both the wounds on the left lower leg especially the more distal one on the lateral aspect 8/12-Patient returns at 1 week and she is agreeable to having debridement in both wounds on her left leg today. We have been using Sorbact, and vascular studies were reviewed at last visit 8/19; the patient arrives with her wounds fairly clean and no debridement is required. We have used Sorbact which is really done a nice job in cleaning up these very difficult wound surfaces. The patient saw Dr. Donzetta Matters of vascular surgery on 7/31. He did not feel that there was an arterial component. He felt that her treated greater saphenous vein is adequately addressed and that the small  saphenous vein did not appear to be involved significantly. She was also noted to have deep venous reflux which is not treatable. Dr. Donzetta Matters mentioned the possibility of a central obstructive component leading to reflux and he offered her central venography. She wanted to discuss this or think about it. I have urged her to go ahead with this. She has had recurrent difficult wounds in these areas which do heal but after months in the clinic. If there is anything that can be done to reduce the likelihood of this I think it is worth it. 9/2 she is still working towards getting follow-up with Dr. Donzetta Matters to schedule her CT. Things are quite a bit worse venography. I put Apligraf on 2 weeks ago on both wounds on the medial and lateral part of her left lower leg. She arrives in clinic today with 3 superficial additional wounds above the area laterally and one below the wound medially. She describes a lot of discomfort. I think these are probably wrapped injuries. Does not look like she has cellulitis. 07/20/2019 on evaluation today patient appears to be doing somewhat poorly in regard to her lower extremity ulcers. She in fact showed signs of erythema in fact we may even be dealing with an infection at this time. Unfortunately I am unsure if this is just infection or if indeed there may be some allergic reaction that occurred as a result of the Apligraf application. With  that being said that would be unusual but nonetheless not impossible in this patient is one who is unfortunately allergic to quite a bit. Currently we have been using the Sorbact which seems to do as well as anything for her. I do think we may want to obtain a culture today to see if there is anything showing up there that may need to be addressed. [name], [name] 9/16; noted that last week the wounds look worse in 1 week follow-up of the Apligraf. Using Sorbact as of 2 days ago. She arrives with copious amounts of drainage and new  skin breakdown on the back of the left calf. The wounds arm more substantial bilaterally. There is a fair amount of swelling in the left calf no overt DVT there is edema present I think in the left greater than right thigh. She is supposed to go on 9/28 for CT venography. The wounds on the medial and lateral calf are worse and she has new skin breakdown posteriorly at least new for me. This is almost developing into a circumferential wound area The Apligraf was taken off last week which I agree with things are not going in the right direction a culture was done we do not have that back yet. She is on Augmentin that she started 2 days ago 9/23; dressing was changed by her nurses on Monday. In general there is no improvement in the wound areas although the area looks less angry than last week. She did get Augmentin for MSSA cultured on the 14th. She still appears to have too much swelling in the left leg even with 3 layer compression 9/30; the patient underwent her procedure on 9/28 by Dr. Donzetta [facility]. She was discovered to have the common iliac vein measuring 12.2 mm but at the level of L4-L5 measured 3 mm. After stenting it measured 10 mm. 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 Rouleaux flow in the common femoral and femoral vein was observed much improved after stenting. We are using silver alginate to the wounds on the medial and lateral ankle on the left. 4 layer compression 10/7; the patient had fluid swelling around her knee and 4 layer compression. At the advice of vein and vascular this was reduced to 3 layer which she is tolerating better. We have been using silver alginate under 3 layer compression since last Friday 10/14; arrives with the areas on the left ankle looking a lot better. Inflammation in the area also a lot better. She came in for a nurse check on 10/9 10/21; continued nice improvement. Slight improvements in surface area of both  the medial and lateral wounds on the left. A lot of the satellite lesions in the weeping erythema around these from stasis dermatitis is resolved. We have been using silver alginate 10/28; general improvement in the entire wound areas although not a lot of change in dimensions the wound certainly looks better. There is a lot less in terms of venous inflammation. Continue silver alginate this week however look towards Hydrofera Blue next week 11/4; very adherent debris on the medial wound left wound is not as bad. We have been using silver alginate. Change to [facility] today 11/11; very adherent debris on both wound areas. She went to vein and vascular last week and follow-up they put in [place] boot on this today. He says the [name] was adherent. Wound is definitely not as good as last week. Especially on the  left there the satellite lesions look more prominent 11/18; absolutely no better. erythema on lateral aspect with tenderness. 09/30/2019 on evaluation today patient appears to actually be doing better. Dr. Dellia Nims did put her on doxycycline last week which I do believe has helped her at this point. Fortunately there is no signs of active infection at this time. No fevers, chills, nausea, vomiting, or diarrhea. I do believe he may want extend the doxycycline for 7 additional days just to ensure everything does completely cleared up the patient is in agreement with that plan. Otherwise she is going require some sharp debridement today 12/2; patient is completing a 2-week course of doxycycline. I gave her this empirically for inflammation as well as infection when I last saw her 2 weeks ago. All of this seems to be better. She is using silver alginate she has the area on the medial aspect of the larger area laterally and the 2 small satellite regions laterally above the major wound. 12/9; the patient's wound on the left medial and left lateral calf look really quite good. We have been  using silver alginate. She saw vein and vascular in follow-up on 10/09/2019. She has had a previous left greater saphenous vein ablation by Dr. Oscar La in 2016. More recently she underwent a left common iliac vein stent by Dr. 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 The swelling is improved and certainly the wounds have improved. The patient shows [place] today area on the right medial calf there is almost no wound but leaking lymphedema. She says she start this started 3 or 4 days ago. She did not traumatize it. It is not painful. She does not wear compression on that side 12/16; the patient continues to do well laterally. Medially still requiring debridement. The area on the right calf did not materialize to anything and is not currently open. We wrapped this last time. She has support stockings for that leg although I am not sure they are going to provide adequate compression 12/23; the lateral wound looks stable. Medially still requiring debridement for tightly adherent fibrinous debris. We've been using silver alginate. Surface area not any different Electronic Signature(s) MAZE, SKLUZACEK ([id number]) Signed: 10/28/2019 5:48:31 PM By: Linton Ham MD Entered By: Linton Ham on 10/28/2019 13:32:04 [name], [name] -------------------------------------------------------------------------------- Physical Exam Details Patient Name: ZANAE, REITEN. Date of Service: 10/28/2019 12:45 PM Medical Record Number: [id number] Patient Account Number: [id number] Date of Birth/Sex: [birth year] (74 y.o. F) Treating RN: Cornell Barman Primary Care Provider: [name] Other Clinician: Referring Provider: [name] Treating Provider/Extender: Tito Dine in Treatment: 23 Constitutional Sitting or standing Blood Pressure is within target range for patient.. Pulse regular and within target range for patient.Marland Kitchen Respirations regular, non-labored and within target  range.. Temperature is normal and within the target range for the patient.Marland Kitchen appears in no distress. Notes Wound exam; the area on the left lateral lower leg continues to look healthy over the surface area is not much different. Even under the illumination the wound looks quite good. oProblem is on the right under illumination a very fibrinous adherent debris. Debrided with a #5 curette hemostasis with direct pressure Electronic Signature(s) Signed: 10/28/2019 5:48:31 PM By: Linton Ham MD Entered By: Linton Ham on 10/28/2019 13:32:49 Hays, [name] -------------------------------------------------------------------------------- Physician Orders Details Patient Name: [name], [name]. Date of Service: 10/28/2019 12:45 PM Medical Record Number: [id number] Patient Account Number: [id number] Date of Birth/Sex: [birth year] (74 y.o. F) Treating RN: Cornell Barman  Primary Care Provider: [name] Other Clinician: Referring Provider: [name] Treating Provider/Extender: Tito Dine in Treatment: 28 Verbal / Phone Orders: No Diagnosis Coding Wound Cleansing Wound #5 Left,Medial Lower Leg o Cleanse wound with mild soap and water Wound #6 Left,Lateral Lower Leg o Cleanse wound with mild soap and water Wound #9 Left,Lateral,Posterior Lower Leg o Cleanse wound with mild soap and water Anesthetic (add to Medication List) Wound #5 Left,Medial Lower Leg o Topical Lidocaine 4% cream applied to wound bed prior to debridement (In Clinic Only). Wound #6 Left,Lateral Lower Leg o Topical Lidocaine 4% cream applied to wound bed prior to debridement (In Clinic Only). Wound #9 Left,Lateral,Posterior Lower Leg o Topical Lidocaine 4% cream applied to wound bed prior to debridement (In Clinic Only). Skin Barriers/Peri-Wound Care Wound #5 Left,Medial Lower Leg o Triamcinolone Acetonide Ointment (TCA) Wound #6 Left,Lateral Lower Leg o  Triamcinolone Acetonide Ointment (TCA) Wound #9 Left,Lateral,Posterior Lower Leg o Triamcinolone Acetonide Ointment (TCA) Primary Wound Dressing Wound #6 Left,Lateral Lower Leg o Silver Alginate - adaptic for sticking Wound #9 Left,Lateral,Posterior Lower Leg o Silver Alginate - adaptic for sticking Wound #5 Left,Medial Lower Leg o Other: - sorbact Secondary Dressing Wound #5 Left,Medial Lower Leg o ABD pad ALYSON, LYKKEN. ([id number]) Wound #6 Left,Lateral Lower Leg o ABD pad Wound #9 Left,Lateral,Posterior Lower Leg o ABD pad Dressing Change Frequency Wound #5 Left,Medial Lower Leg o Change dressing every week o Other: - as needed. Wound #6 Left,Lateral Lower Leg o Change dressing every week o Other: - as needed. Wound #9 Left,Lateral,Posterior Lower Leg o Change dressing every week o Other: - as needed. Follow-up Appointments Wound #5 Left,Medial Lower Leg o Return Appointment in 1 week. o Nurse Visit as needed - Call of needed Wound #6 Left,Lateral Lower Leg o Return Appointment in 1 week. o Nurse Visit as needed - Call of needed Wound #9 Left,Lateral,Posterior Lower Leg o Return Appointment in 1 week. o Nurse Visit as needed - Call of needed Edema Control Wound #5 Left,Medial Lower Leg o 3 Layer Compression System - Left Lower Extremity - unna to anchor Wound #6 Left,Lateral Lower Leg o 3 Layer Compression System - Left Lower Extremity - unna to anchor Wound #9 Left,Lateral,Posterior Lower Leg o 3 Layer Compression System - Left Lower Extremity - unna to anchor Electronic Signature(s) Signed: 10/28/2019 5:48:31 PM By: Linton Ham MD Signed: 11/11/2019 4:51:46 PM By: [name], [name], RN, CWS, [name], [name] Entered By: [name], [name], RN, CWS, Kim on 10/28/2019 13:28:41 AMBERLYNN, STRADLING ([id number]) -------------------------------------------------------------------------------- Problem List Details Patient Name: NOELIE, NUNNERY. Date of Service: 10/28/2019 12:45 PM Medical Record Number: [id number] Patient Account Number: [id number] Date of Birth/Sex: 1946-07-31 (74 y.o. F) Treating RN: Cornell Barman Primary Care Provider: [name] Other Clinician: Referring Provider: [name] Treating Provider/Extender: Tito Dine in Treatment: 46 Active Problems ICD-10 Evaluated Encounter Code Description Active Date Today Diagnosis L97.221 Non-pressure chronic ulcer of left calf limited to breakdown of 01/07/2019 No Yes skin I87.321 Chronic venous hypertension (idiopathic) with inflammation of 12/10/2018 No Yes right lower extremity I89.0 Lymphedema, not elsewhere classified 12/10/2018 No Yes L97.818 Non-pressure chronic ulcer of other part of right lower leg 10/14/2019 No Yes with other specified severity Inactive Problems Resolved Problems Electronic Signature(s) Signed: 10/28/2019 5:48:31 PM By: Linton Ham MD Entered By: Linton Ham on 10/28/2019 13:30:34 Kinkaid, [name] -------------------------------------------------------------------------------- Progress Note Details Patient Name: JAMELA, JANZ. Date of Service: 10/28/2019 12:45 PM Medical Record Number: [id number]  Patient Account Number: [id number] Date of Birth/Sex: Feb 09, 1946 (74 y.o. F) Treating RN: Cornell Barman Primary Care Provider: [name] Other Clinician: Referring Provider: [name] Treating Provider/Extender: Tito Dine in Treatment: 56 Subjective History of Present Illness (HPI) Pleasant 74 year old with history of chronic venous insufficiency. No diabetes or peripheral vascular disease. Left ABI 1.29. Questionable history of left lower extremity DVT. She developed a recurrent ulceration on her left lateral calf in December 2015, which she attributes to poor diet and subsequent lower extremity edema. She underwent endovenous laser ablation of her left greater saphenous  vein in 2010. She underwent laser ablation of accessory branch of left GSV in April 2016 by Dr. Kellie Simmering at [facility]. She was previously wearing Unna boots, which she tolerated well. Tolerating 2 layer compression and cadexomer iodine. She returns to clinic for follow-up and is without new complaints. She denies any significant pain at this time. She reports persistent pain with pressure. No claudication or ischemic rest pain. No fever or chills. No drainage. READMISSION 11/13/16; this is a 74 year old woman who is not a diabetic. She is here for a review of a painful area on her left medial lower extremity. I note that she was seen here previously last year for wound I believe to be in the same area. At that time she had undergone previously a left greater saphenous vein ablation by Dr. Kellie Simmering and she had a ablation of the anterior accessory branch of the left greater saphenous vein in March 2016. Seeing that the wound actually closed over. In reviewing the history with her today the ulcer in this area has been recurrent. She describes a biopsy of this area in 2009 that only showed stasis physiology. 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 Do not see that she has ever had arterial studies in the left leg. She tells me she has a follow-up with Dr. Kellie Simmering in roughly 10 days In any case she developed the reopening of this area roughly a month ago. On the background of this she describes rapidly increasing edema which has responded to Lasix 40 mg and metolazone 2.5 mg as well as the patient's lymph massage. She has been told she has both venous insufficiency and lymphedema but she cannot tolerate compression stockings 11/28/16; the patient saw Dr. Kellie Simmering recently. Per the patient he  did arterial Dopplers in the office that did not show evidence of arterial insufficiency, per the patient he stated "treat this like an ordinary venous ulcer". She also saw her dermatologist Dr. Ronnald Ramp who felt that this was more of a vascular ulcer. In general things are improving although she arrives today with increasing bilateral lower extremity edema with weeping a deeper fluid through the wound on the left medial leg compatible with some degree of lymphedema 12/04/16; the patient's wound is fully epithelialized but I don't think fully healed. We will do another week of depression with Promogran and TCA however I suspect we'll be able to discharge her next week. This is a very unusual-looking wound which was initially a figure-of-eight type wound lying on its side surrounded by petechial like hemorrhage. She has had venous ablation on this side. She apparently does not have an arterial issue per Dr. Kellie Simmering. She saw her dermatologist thought it  was "vascular". Patient is definitely going to need ongoing compression and I talked about this with her today she will go to elastic therapy after she leaves here next week 12/11/16; the patient's wound is not completely closed today. She has surrounding scar tissue and in further discussion with the patient it would appear that she had ulcers in this area in 2009 for a prolonged period of time ultimately requiring a punch biopsy of this area that only showed venous insufficiency. I did not previously pickup on this part of the history from the patient. 12/18/16; the patient's wound is completely epithelialized. There is no open area here. She has significant bilateral venous insufficiency with secondary lymphedema to a mild-to-moderate degree she does not have compression stockings.. She did not say anything to me when I was in the room, she told our intake nurse that she was still having pain in this area. This isn't unusual recurrent small open area. She  is going to go to elastic therapy to obtain compression stockings. 12/25/16; the patient's wound is fully epithelialized. There is no open area here. The patient describes some continued episodic discomfort in this area medial left calf. However everything looks fine and healed here. She is been to elastic therapy and [name], [name] caught herself 15-20 mmHg stockings, they apparently were having trouble getting 20-30 mm stockings in her size 01/22/17; this is a patient we discharged from the clinic a month ago. She has a recurrent open wound on her medial left calf. She had 15 mm support stockings. I told her I thought she needed 20-30 mm compression stockings. She tells me that she has been ill with hospitalization secondary to asthma and is been found to have severe hypokalemia likely secondary to a combination of Lasix and metolazone. This morning she noted blistering and leaking fluid on the posterior part of her left leg. She called our intake nurse urgently and we was saw her this afternoon. She has not had any real discomfort here. I don't know that she's been wearing any stockings on this leg for at least 2-3 days. ABIs in this clinic were 1.21 on the right and 1.3 on the left. She is previously seen vascular surgery who does not think that there is a peripheral arterial issue. 01/30/17; Patient arrives with no open wound on the left leg. She has been to elastic therapy and obtained 20-58mmhg below knee stockings and she has one on the right leg today. READMISSION 02/19/18; this [place] is a now 74 year old patient we've had in this clinic perhaps 3 times before. I had last looked at her from January 07 December 2016 with an area on the medial left leg. We discharged her on 12/25/16 however she had to be readmitted on 01/22/17 with a recurrence. I have in my notes that we discharged her on 20-30 mm stockings although she tells me she was only wearing support hose because she cannot  get stockings on predominantly related to her cervical spine surgery/issues. She has had previous ablations done by vein and vascular in [place] including a great saphenous vein ablation on the left with an anterior accessory branch ablation I think both of these were in 2016. On one of the previous visit she had a biopsy noted 2009 that was negative. She is not felt to have an arterial issue. She is not a diabetic. She does have a history of obstructive sleep apnea hypertension asthma as well as chronic venous insufficiency and lymphedema. On this occasion  she noted 2 dry scaly patch on her left leg. She tried to put lotion on this it didn't really help. There were 2 open areas.the patient has been seeing her primary physician from 02/05/18 through 02/14/18. She had Unna boots applied. The superior wound now on the lateral left leg has closed but she's had one wound that remains open on the lateral left leg. This is not the same spot as we dealt with in 2018. ABIs in this clinic were 1.3 bilaterally 02/26/18; patient has a small wound on the left lateral calf. Dimensions are down. She has chronic venous insufficiency and lymphedema. 03/05/18; small open area on the left lateral calf. Dimensions are down. Tightly adherent necrotic debris over the surface of the wound which was difficult to remove. Also the dressing [over collagen] stuck to the wound surface. This was removed with some difficulty as well. Change the primary dressing to Hydrofera Blue ready 03/12/18; small open area on the left lateral calf. Comes in with tightly adherent surface eschar as well as some adherent Hydrofera Blue. 03/19/18; open area on the left lateral calf. Again adherent surface eschar as well as some adherent Hydrofera Blue nonviable subcutaneous tissue. She complained of pain all week even with the reduction from 4-3 layer compression I put on last week. Also she had an increase in her ankle and calf measurements  probably related to the same thing. 03/26/18; open area on the left lateral calf. A very small open area remains here. We used silver alginate starting last week as the Hydrofera Blue seem to stick to the wound bed. In using 4-layer compression 04/02/18; the open area in the left lateral calf at some adherent slough which I removed there is no open area here. We are able to transition her into her own compression stocking. Truthfully I think this is probably his support hose. However this does not maintain skin integrity will be limited. She cannot put over the toe compression stockings on because of neck problems hand problems etc. She is allergic to the lining layer of juxta lites. We might be forced to use extremitease stocking should this fail READMIT 11/24/2018 Patient is now a 74 year old woman who is not a diabetic. She has been in this clinic on at least 3 previous occasions largely with recurrent wounds on her left leg secondary to chronic venous insufficiency with secondary lymphedema. Her situation is complicated by inability to get stockings on and an allergy to neoprene which is apparently a component and at least juxta lites and other stockings. As a result she really has not been wearing any stockings on her legs. She tells [place] that roughly 2 or 3 weeks ago she started noticing a stinging sensation just above her ankle on the left medial aspect. She has been diagnosed with pseudogout and she wondered whether this was what she was experiencing. She tried to dress this with something she bought at the store however subsequently it pulled skin off and now she has an open wound that is not improving. She has been using Vaseline gauze with a cover bandage. She saw her primary doctor last week who put an Haematologist on her. ABIs in this clinic was 1.03 on the left [name], [name]. ([id number]) 2/12; the area is on the left medial ankle. Odd-looking wound with what looks to be surface  epithelialization but a multitude of small petechial openings. This clearly not closed yet. We have been using silver alginate under 3 layer compression with TCA  2/19; the wound area did not look quite as good this week. Necrotic debris over the majority of the wound surface which required debridement. She continues to have a multitude of what looked to be small petechial openings. She reminds [place] that she had a biopsy on this initially during her first outbreak in 2015 in [place] [facility]. She expresses concern about this being a possible melanoma. 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 I assured her that this does not look anything like melanoma. Except for the petechial reaction it does look like a venous insufficiency area and she certainly has evidence of this on both sides 2/26; a difficult area on the left medial ankle. The patient clearly has chronic venous hypertension with some degree of lymphedema. The odd thing about the area is the small petechial hemorrhages. I am not really sure how to explain this. This was present last time and this is not a compression injury. We have been using Hydrofera Blue which I changed to last week 3/4; still using Hydrofera Blue. Aggressive debridement today. She does not have known arterial issues. She has seen Dr. Kellie Simmering at [facility] vein and vascular and and has an ablation on the left. [Anterior accessory branch of the greater saphenous]. From what I remember they did not feel she had an arterial issue. The patient has had this area biopsied in 2009 at [facility] dermatology and by her recollection they said this was "stasis". She is also follow-up with dermatology locally who thought that this was more of a vascular issue 3/11; using Hydrofera Blue. Aggressive debridement today. She does not have an arterial issue. We are using 3 layer compression although  we may need to go to 4. The patient has been in for multiple changes to her wrap since I last saw her a week ago. She says that the area was leaking. I do not have too much more information on what was found 01/19/19 on evaluation today patient was actually being seen for a nurse visit when unfortunately she had the area on her left lateral lower extremity as well as weeping from the right lower extremity that became apparent. Therefore we did end up actually seeing her for a full visit with myself. She is having some pain at this site as well but fortunately nothing too significant at this point. No fevers, chills, nausea, or vomiting noted at this time. 3/18-Patient is back to the clinic with the left leg venous leg ulcer, the ulcer is larger in size, has a surface that is densely adherent with fibrinous tissue, the Hydrofera Blue was used but is densely adherent and there was difficulty in removing it. The right lower extremity was also wrapped for weeping edema. Patient has a new area over the left lateral foot above the malleolus that is small and appears to have no debris with intact surrounding skin. Patient is on increased dose of Lasix also as a means to edema management 3/25; the patient has a nonhealing venous ulcer on the medial left leg and last week developed a smaller area on the lateral left calf. We have been using Hydrofera Blue with a contact layer. 4/1; no major change in these wounds areas. Left medial and more recently left lateral calf. I tried Iodoflex last week to aid in debridement she did not tolerate this. She stated her pain was terrible all week. She took the top layer of the 4  layer compression off. 4/8; the patient actually looks somewhat better in terms of her more prominent left lateral calf wound. There is some healthy looking tissue here. She is still complaining of a lot of discomfort. 4/15; patient in a lot of pain secondary to sciatica. She is on a prednisone  taper prescribed by her primary physician. She has the 2 areas one on the left medial and more recently a smaller area on the left lateral calf. Both of these just above the malleoli 4/22; her back pain is better but she still states she is very uncomfortable and now feels she is intolerant to the [employer]. No real change in the wounds we have been using Sorbact. She has been previously intolerant to Iodoflex. There is not a lot of option about what we can use to debride this wound under compression that she no doubt needs. sHe states Ultram no longer works for her pain 4/29; no major change in the wounds slightly increased depth. Surface on the original medial wound perhaps somewhat improved however the more recent area on the lateral left ankle is 100% covered in very adherent debris we have been using Sorbact. She tolerates 4 layer compression well and her edema control is a lot better. She has not had to come in for a nurse check 5/6; no major change in the condition of the wounds. She did consent to debridement today which was done with some difficulty. Continuing Sorbact. She did not tolerate Iodoflex. She was in for a check of her compression the day after we wrapped her last week this was adjusted but nothing much was found 5/13; no major change in the condition or area of the wounds. I was able to get a fairly aggressive debridement done on the lateral left leg wound. Even using Sorbact under compression. She came back in on Friday to have the wrap changed. She says she felt uncomfortable on the lateral aspect of her ankle. She has a long history of chronic venous insufficiency including previous ablation surgery on this side. 5/20-Patient returns for wounds on left leg with both wounds covered in slough, with the lateral leg wound larger in size, she has been in 3 layer compression and felt more comfortable, she describes pain in ankle, in leg and pins and needles in foot, and is about  to try Pamelor for this 6/3; wounds on the left lateral and left medial leg. The area medially which is the most recent of the 2 seems to have had the largest increase in dimensions. We have been using Sorbac to try and debride the surface. She has been to see orthopedics [name], [name] they apparently did a plain x-ray that was indeterminant. Diagnosed her with neuropathy and they have ordered an MRI to determine if there is underlying osteomyelitis. This was not high on my thought list but I suppose it is prudent. We have advised her to make an appointment with vein and vascular in [name]. She has a history of a left greater saphenous and accessory vein ablations I wonder if there is anything else that can be done from a surgical point of view to help in these difficult refractory wounds. We have previously healed this wound on one occasion but it keeps on reopening [medial side] 6/10; deep tissue culture I did last week I think on the left medial wound showed both moderate E. coli and moderate staph aureus [MSSA]. She is going to require antibiotics and I have chosen Augmentin.  We have been using Sorbact and we have made better looking wound surface on both sides but certainly no improvement in wound area. She was back in last Friday apparently for a dressing changes the wrap was hurting her outer left ankle. She has not managed to get a hold of vein and vascular in [name]. We are going to have to make her that appointment 6/17; patient is tolerating the Augmentin. She had an MRI that I think was ordered by orthopedic surgeon this did not show osteomyelitis or an abscess did suggest cellulitis. We have been using Sorbact to the lateral and medial ankles. We have been trying to arrange a follow-up appointment with vein and vascular in [place] or did her original ablations. We apparently an area sent the request to vein and vascular in [facility] 6/24; patient has  completed the Augmentin. We do not yet have a vein and vascular appointment in [place]. I am not sure what the issue is here we have asked her to call tomorrow. We are using Sorbact. Making some improvements and especially the medial wound. Both surfaces however look better medial and lateral. 7/1; the patient has been in contact with vein and vascular in [place] but has not yet received an appointment. Using Sorbact we have gradually improve the wound surface with no improvement in surface area. She is approved for Apligraf but the wound surface still is not completely viable. She has not had to come in for a dressing change 7/8; the patient has an appointment with vein and vascular on 7/31 which is a Friday afternoon. She is concerned about getting back here for [place] to dress her wounds. I think it is important to have them goal for her venous reflux/history of ablations etc. to see if anything else can be done. She apparently tested positive for 1 of the blood tests with regards to lupus and saw a rheumatologist. He has raised the issue of vasculitis again. I have had this thought in the past however the evidence seems overwhelming that this is a venous reflux etiology. If the rheumatologist tells me there is clinical and laboratory investigation is positive for lupus I will rethink this. 7/15; the patient's wound surfaces are quite a bit better. The medial area which was her original wound now has no depth although the lateral wound which was the more recent area actually appears larger. Both with viable surfaces which is indeed better. Using Sorbact. I wanted to use Apligraf on her however there is the issue of the vein and vascular appointment on 7/31 at 2:00 in the afternoon which would not allow her to get back to be rewrapped and they would no doubt remove the graft 7/22; the patient's wound surfaces have moderate amount of debris although generally look better. The lateral one is  larger with 2 small satellite areas superiorly. We are waiting for her vein and vascular appointment on 7/31. She has been approved for Apligraf which I would like to use after th 7/29; wound surfaces have improved no debridement is required we have been using Sorbact. She sees vein and vascular on Friday with this so question of whether anything can be done to lessen the likelihood of recurrence and/or speed the healing of these areas. She is already had previous ablations. She no doubt has severe venous hypertension 8/5-Patient returns at 1 week, she was in [place] for 3 days by her podiatrist, we have been using so backed to the wound, she has increased pain in  both the wounds on the left lower leg especially the more distal one on the lateral aspect 8/12-Patient returns at 1 week and she is agreeable to having debridement in both wounds on her left leg today. We have been using Sorbact, and vascular studies were reviewed at last visit 8/19; the patient arrives with her wounds fairly clean and no debridement is required. We have used Sorbact which is really done a nice job in cleaning up these very difficult wound surfaces. The patient saw Dr. Donzetta Matters of vascular surgery on 7/31. He did not feel that there was an arterial component. He felt that her treated greater saphenous vein is adequately addressed and that the small saphenous vein did not appear to be involved significantly. She was also noted to have deep venous reflux which is not treatable. Dr. Donzetta Matters mentioned the possibility of a central obstructive component leading to reflux and he offered her central venography. She wanted to discuss this or think about it. I have urged her to go ahead with this. She has had recurrent difficult wounds in these areas which do heal but after months in the clinic. If there is anything that can be done to reduce the likelihood of this I think it is worth it. 9/2 she is still working towards getting  follow-up with Dr. Donzetta Matters to schedule her CT. Things are quite a bit worse venography. I put Apligraf on 2 weeks ago on both wounds on the medial and lateral part of her left lower leg. She arrives in clinic today with 3 superficial additional wounds above the area laterally and one below the wound medially. She describes a lot of discomfort. I think these are probably wrapped injuries. Does not look like she has cellulitis. 07/20/2019 on evaluation today patient appears to be doing somewhat poorly in regard to her lower extremity ulcers. She in fact showed signs of erythema in fact we may even be dealing with an infection at this time. Unfortunately I am unsure if this is just infection or if indeed there may be some allergic reaction that occurred as a result of the Apligraf application. With that being said that would be unusual but nonetheless not impossible in this patient is one who is unfortunately allergic to quite a bit. Currently we have been using the Sorbact which seems to do as well as anything for her. I do think we may want to obtain ADAIJA, [place] ([id number]) a culture today to see if there is anything showing up there that may need to be addressed. 9/16; noted that last week the wounds look worse in 1 week follow-up of the Apligraf. Using Sorbact as of 2 days ago. She arrives with copious amounts of drainage and new skin breakdown on the back of the left calf. The wounds arm more substantial bilaterally. There is a fair amount of swelling in the left calf no overt DVT there is edema present I think in the left greater than right thigh. She is supposed to go on 9/28 for CT venography. The wounds on the medial and lateral calf are worse and she has new skin breakdown posteriorly at least new for me. This is almost developing into a circumferential wound area The Apligraf was taken off last week which I agree with things are not going in the right direction a culture was done we do  not have that back yet. She is on Augmentin that she started 2 days ago 9/23; dressing was changed by her nurses on  Monday. In general there is no improvement in the wound areas although the area looks less angry than last week. She did get Augmentin for MSSA cultured on the 14th. She still appears to have too much swelling in the left leg even with 3 layer compression 9/30; the patient underwent her procedure on 9/28 by Dr. Donzetta Matters at vascular and vein specialist. She was discovered to have the common iliac vein measuring 12.2 mm but at the level of L4-L5 measured 3 mm. After stenting it measured 10 mm. 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 Rouleaux flow in the common femoral and femoral vein was observed much improved after stenting. We are using silver alginate to the wounds on the medial and lateral ankle on the left. 4 layer compression 10/7; the patient had fluid swelling around her knee and 4 layer compression. At the advice of vein and vascular this was reduced to 3 layer which she is tolerating better. We have been using silver alginate under 3 layer compression since last Friday 10/14; arrives with the areas on the left ankle looking a lot better. Inflammation in the area also a lot better. She came in for a nurse check on 10/9 10/21; continued nice improvement. Slight improvements in surface area of both the medial and lateral wounds on the left. A lot of the satellite lesions in the weeping erythema around these from stasis dermatitis is resolved. We have been using silver alginate 10/28; general improvement in the entire wound areas although not a lot of change in dimensions the wound certainly looks better. There is a lot less in terms of venous inflammation. Continue silver alginate this week however look towards Hydrofera Blue next week 11/4; very adherent debris on the medial wound left wound is not as bad. We have been using silver alginate. Change  to [facility] today 11/11; very adherent debris on both wound areas. She went to vein and vascular last week and follow-up they put in [place] boot on this today. He says the [facility] was adherent. Wound is definitely not as good as last week. Especially on the left there the satellite lesions look more prominent 11/18; absolutely no better. erythema on lateral aspect with tenderness. 09/30/2019 on evaluation today patient appears to actually be doing better. Dr. Dellia Nims did put her on doxycycline last week which I do believe has helped her at this point. Fortunately there is no signs of active infection at this time. No fevers, chills, nausea, vomiting, or diarrhea. I do believe he may want extend the doxycycline for 7 additional days just to ensure everything does completely cleared up the patient is in agreement with that plan. Otherwise she is going require some sharp debridement today 12/2; patient is completing a 2-week course of doxycycline. I gave her this empirically for inflammation as well as infection when I last saw her 2 weeks ago. All of this seems to be better. She is using silver alginate she has the area on the medial aspect of the larger area laterally and the 2 small satellite regions laterally above the major wound. 12/9; the patient's wound on the left medial and left lateral calf look really quite good. We have been using silver alginate. She saw vein and vascular in follow-up on 10/09/2019. She has had a previous left greater saphenous vein ablation by Dr. Oscar La in 2016. More recently she underwent a left common iliac vein stent by Dr. 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  The swelling is improved and certainly the wounds have improved. The patient shows [place] today area on the right medial calf there is almost no wound but leaking lymphedema. She says she start this started 3 or 4 days ago. She did not traumatize it. It is not painful. She does not wear  compression on that side 12/16; the patient continues to do well laterally. Medially still requiring debridement. The area on the right calf did not materialize to anything and is not currently open. We wrapped this last time. She has support stockings for that leg although I am not sure they are going to provide adequate compression 12/23; the lateral wound looks stable. Medially still requiring debridement for tightly adherent fibrinous debris. We've been using silver alginate. Surface area not any different [name], [name]. ([id number]) Objective Constitutional Sitting or standing Blood Pressure is within target range for patient.. Pulse regular and within target range for patient.Marland Kitchen Respirations regular, non-labored and within target range.. Temperature is normal and within the target range for the patient.Marland Kitchen appears in no distress. Vitals Time Taken: 12:55 PM, Height: 63 in, Weight: 224.7 lbs, BMI: 39.8, Temperature: 98.2 F, Pulse: 89 bpm, Respiratory Rate: 16 breaths/min, Blood Pressure: 131/72 mmHg. General Notes: Wound exam; the area on the left lateral lower leg continues to look healthy over the surface area is not much different. Even under the illumination the wound looks quite good. Problem is on the right under illumination a very fibrinous adherent debris. Debrided with a #5 curette hemostasis with direct pressure Integumentary (Hair, Skin) Wound #5 status is Open. Original cause of wound was Gradually Appeared. The wound is located on the Left,Medial Lower Leg. The wound measures 2.6cm length x 2.4cm width x 0.2cm depth; 4.901cm^2 area and 0.98cm^3 volume. There is Fat Layer (Subcutaneous Tissue) Exposed exposed. There is a large amount of serosanguineous drainage noted. The wound margin is flat and intact. There is medium (34-66%) pale granulation within the wound bed. There is a medium (34-66%) amount of necrotic tissue within the wound bed including Adherent Slough. Wound #6  status is Open. Original cause of wound was Gradually Appeared. The wound is located on the Left,Lateral Lower Leg. The wound measures 2.8cm length x 2.5cm width x 0.2cm depth; 5.498cm^2 area and 1.1cm^3 volume. There is Fat Layer (Subcutaneous Tissue) Exposed exposed. There is a large amount of serosanguineous drainage noted. The wound margin is flat and intact. There is medium (34-66%) pink granulation within the wound bed. There is a medium (34-66%) amount of necrotic tissue within the wound bed including Adherent Slough. Wound #9 status is Open. Original cause of wound was Gradually Appeared. The wound is located on the Left,Lateral,Posterior Lower Leg. The wound measures 0.8cm length x 0.5cm width x 0.1cm depth; 0.314cm^2 area and 0.031cm^3 volume. There is Fat Layer (Subcutaneous Tissue) Exposed exposed. There is a large amount of serosanguineous drainage noted. The wound margin is flat and intact. There is large (67-100%) pink granulation within the wound bed. There is a small (1-33%) amount of necrotic tissue within the wound bed including Adherent Slough. Assessment Active Problems ICD-10 Non-pressure chronic ulcer of left calf limited to breakdown of skin Chronic venous hypertension (idiopathic) with inflammation of right lower extremity Lymphedema, not elsewhere classified Non-pressure chronic ulcer of other part of right lower leg with other specified severity EVERETTA, SHARRY ([id number]) Procedures Wound #5 Pre-procedure diagnosis of Wound #5 is a Lymphedema located on the Left,Medial Lower Leg . There was a Excisional Skin/Subcutaneous Tissue Debridement  with a total area of 6.24 sq cm performed by [name] Dillon, MD. With the following instrument(s): Curette to remove Viable and Non-Viable tissue/material. Material removed includes Subcutaneous Tissue and Slough and after achieving pain control using Lidocaine. No specimens were taken. A time out was conducted at 13:23,  prior to the start of the procedure. A Moderate amount of bleeding was controlled with Pressure. The procedure was tolerated well. Post Debridement Measurements: 2.6cm length x 2.4cm width x 0.3cm depth; 1.47cm^3 volume. Character of Wound/Ulcer Post Debridement is stable. Post procedure Diagnosis Wound #5: Same as Pre-Procedure Plan Wound Cleansing: Wound #5 Left,Medial Lower Leg: Cleanse wound with mild soap and water Wound #6 Left,Lateral Lower Leg: Cleanse wound with mild soap and water Wound #9 Left,Lateral,Posterior Lower Leg: Cleanse wound with mild soap and water Anesthetic (add to Medication List): Wound #5 Left,Medial Lower Leg: Topical Lidocaine 4% cream applied to wound bed prior to debridement (In Clinic Only). Wound #6 Left,Lateral Lower Leg: Topical Lidocaine 4% cream applied to wound bed prior to debridement (In Clinic Only). Wound #9 Left,Lateral,Posterior Lower Leg: Topical Lidocaine 4% cream applied to wound bed prior to debridement (In Clinic Only). Skin Barriers/Peri-Wound Care: Wound #5 Left,Medial Lower Leg: Triamcinolone Acetonide Ointment (TCA) Wound #6 Left,Lateral Lower Leg: Triamcinolone Acetonide Ointment (TCA) Wound #9 Left,Lateral,Posterior Lower Leg: Triamcinolone Acetonide Ointment (TCA) Primary Wound Dressing: Wound #6 Left,Lateral Lower Leg: Silver Alginate - adaptic for sticking Wound #9 Left,Lateral,Posterior Lower Leg: Silver Alginate - adaptic for sticking Wound #5 Left,Medial Lower Leg: Other: - sorbact Secondary Dressing: Wound #5 Left,Medial Lower Leg: ABD pad Wound #6 Left,Lateral Lower Leg: ABD pad Wound #9 Left,Lateral,Posterior Lower Leg: ABD pad Dressing Change FrequencyLATRESHA, CINA ([id number]) Wound #5 Left,Medial Lower Leg: Change dressing every week Other: - as needed. Wound #6 Left,Lateral Lower Leg: Change dressing every week Other: - as needed. Wound #9 Left,Lateral,Posterior Lower Leg: Change dressing every  week Other: - as needed. Follow-up Appointments: Wound #5 Left,Medial Lower Leg: Return Appointment in 1 week. Nurse Visit as needed - Call of needed Wound #6 Left,Lateral Lower Leg: Return Appointment in 1 week. Nurse Visit as needed - Call of needed Wound #9 Left,Lateral,Posterior Lower Leg: Return Appointment in 1 week. Nurse Visit as needed - Call of needed Edema Control: Wound #5 Left,Medial Lower Leg: 3 Layer Compression System - Left Lower Extremity - unna to anchor Wound #6 Left,Lateral Lower Leg: 3 Layer Compression System - Left Lower Extremity - unna to anchor Wound #9 Left,Lateral,Posterior Lower Leg: 3 Layer Compression System - Left Lower Extremity - unna to anchor 1. We're continuing silver alginate to the lateral wound. Sorbact to the medial wound which in the past seems to have extensive venous debris Electronic Signature(s) Signed: 10/28/2019 5:48:31 PM By: Linton Ham MD Entered By: Linton Ham on 10/28/2019 13:33:34 Sowles, [name] -------------------------------------------------------------------------------- SuperBill Details Patient Name: YENTL, RUDESILL. Date of Service: 10/28/2019 Medical Record Number: [id number] Patient Account Number: [id number] Date of Birth/Sex: [birth year] (74 y.o. F) Treating RN: Cornell Barman Primary Care Provider: [name] Other Clinician: Referring Provider: [name] Treating Provider/Extender: Tito Dine in Treatment: 46 Diagnosis Coding ICD-10 Codes Code Description [phone] Non-pressure chronic ulcer of left calf limited to breakdown of skin I87.321 Chronic venous hypertension (idiopathic) with inflammation of right lower extremity I89.0 Lymphedema, not elsewhere classified L97.818 Non-pressure chronic ulcer of other part of right lower leg with other specified severity Facility Procedures CPT4 Code: [id number] Description: [id number] - DEB SUBQ TISSUE 20  SQ CM/< ICD-10 Diagnosis  Description L97.221 Non-pressure chronic ulcer of left calf limited to breakdown o Modifier: f skin Quantity: 1 Physician Procedures CPT4 Code: [id number] Description: [id number] - WC PHYS SUBQ TISS 20 SQ CM ICD-10 Diagnosis Description L97.221 Non-pressure chronic ulcer of left calf limited to breakdown o Modifier: f skin Quantity: 1 Electronic Signature(s) Signed: 10/28/2019 5:48:31 PM By: Linton Ham MD Entered By: Linton Ham on 10/28/2019 13:33:53

## 2019-11-12 ENCOUNTER — Ambulatory Visit (INDEPENDENT_AMBULATORY_CARE_PROVIDER_SITE_OTHER): Payer: Medicare Other | Admitting: Podiatry

## 2019-11-12 ENCOUNTER — Encounter: Payer: Self-pay | Admitting: Podiatry

## 2019-11-12 DIAGNOSIS — B351 Tinea unguium: Secondary | ICD-10-CM | POA: Diagnosis not present

## 2019-11-12 DIAGNOSIS — M79674 Pain in right toe(s): Secondary | ICD-10-CM

## 2019-11-12 DIAGNOSIS — M79675 Pain in left toe(s): Secondary | ICD-10-CM

## 2019-11-12 NOTE — Progress Notes (Signed)
Complaint:  Visit Type: Patient returns to my office for continued preventative foot care services. Complaint: Patient states" my nails have grown long and thick and become painful to walk and wear shoes"  The patient presents for preventative foot care services. No changes to ROS.  Patient says she has been wearing unna boots for venous ulcers on her left leg. Patient does want her big toenails cut short.  Podiatric Exam: Vascular: dorsalis pedis and posterior tibial pulses are palpable bilateral. Capillary return is immediate. Temperature gradient is WNL. Skin turgor WNL Varicosities  Feet  B/L  Swelling legs/ankle  B/L. Sensorium: Normal Semmes Weinstein monofilament test. Normal tactile sensation bilaterally. Nail Exam: Pt has thick disfigured discolored nails with subungual debris noted bilateral entire nail hallux through fifth toenails Ulcer Exam: There is no evidence of ulcer or pre-ulcerative changes or infection. Orthopedic Exam: Muscle tone and strength are WNL. No limitations in general ROM. No crepitus or effusions noted. Foot type and digits show no abnormalities. DJD  [name]  B/L Swelling ankles  B/L Skin: No Porokeratosis. No infection or ulcers.  Asymptomatic clavi.  Dried blood on base lateral border right hallux.  Blackened second toenail right foot.  Diagnosis:  Onychomycosis, , Pain in right toe, pain in left toes  Treatment & Plan Procedures and Treatment: Consent by patient was obtained for treatment procedures. The patient understood the discussion of treatment and procedures well. All questions were answered thoroughly reviewed. Debridement of mycotic and hypertrophic toenails, 1 through 5 bilateral and clearing of subungual debris. No ulceration, no infection noted.  Return Visit-Office Procedure: Patient instructed to return to the office for a follow up visit 9 weeks  for continued evaluation and treatment.    Gardiner Barefoot DPM

## 2019-11-12 NOTE — Progress Notes (Signed)
[name], [name] Visit Report for 11/11/2019 Arrival Information Details Patient Name: [name], [name]. Date of Service: 11/11/2019 12:45 PM Medical Record Number: [id number] Patient Account Number: [id number] Date of Birth/Sex: 09-28-1946 (74 y.o. F) Treating RN: Cornell Barman Primary Care Charnell Peplinski: [name] Other Clinician: Referring Clorene Nerio: [name] Treating Romolo Sieling/Extender: Tito Dine in Treatment: 2 Visit Information History Since Last Visit Added or deleted any medications: No Patient Arrived: Ambulatory Any new allergies or adverse reactions: No Arrival Time: 12:44 Had a fall or experienced change in No Accompanied By: self activities of daily living that may affect Transfer Assistance: None risk of falls: Patient Identification Verified: Yes Signs or symptoms of abuse/neglect since last visito No Secondary Verification Process Yes Hospitalized since last visit: No Completed: Implantable device outside of the clinic excluding No Patient Requires Transmission-Based No cellular tissue based products placed in the center Precautions: since last visit: Patient Has Alerts: Yes Has Dressing in Place as Prescribed: Yes Patient Alerts: Patient on Blood Has Compression in Place as Prescribed: Yes Thinner Pain Present Now: No aspirin 81 Electronic Signature(s) Signed: 11/11/2019 3:30:55 PM By: Lorine Bears RCP, RRT, CHT Entered By: Lorine Bears on 11/11/2019 12:46:10 [name], [name] -------------------------------------------------------------------------------- Encounter Discharge Information Details Patient Name: [name], [name]. Date of Service: 11/11/2019 12:45 PM Medical Record Number: [id number] Patient Account Number: [id number] Date of Birth/Sex: 01/24/1946 (74 y.o. F) Treating RN: Cornell Barman Primary Care Micheal Murad: [name] Other Clinician: Referring Rosemary Pentecost: [name] Treating Therese Rocco/Extender: Tito Dine in Treatment: 48 Encounter Discharge Information Items Post Procedure Vitals Discharge Condition: Stable Temperature (F): 98.2 Ambulatory Status: Ambulatory Pulse (bpm): 69 Discharge Destination: Home Respiratory Rate (breaths/min): 16 Transportation: Private Auto Blood Pressure (mmHg): 141/65 Accompanied By: self Schedule Follow-up Appointment: Yes Clinical Summary of Care: Electronic Signature(s) Signed: 11/11/2019 4:51:02 PM By: [name], [name], RN, CWS, [name], [name] Entered By: [name], [name], RN, CWS, Kim on 11/11/2019 13:03:41 [name] -------------------------------------------------------------------------------- Lower Extremity Assessment Details Patient Name: [name], [name]. Date of Service: 11/11/2019 12:45 PM Medical Record Number: [id number] Patient Account Number: [id number] Date of Birth/Sex: Jun 12, 1946 (74 y.o. F) Treating RN: Army Melia Primary Care Philmore Lepore: [name] Other Clinician: Referring Saraiyah Hemminger: [name] Treating Corliss Lamartina/Extender: Tito Dine in Treatment: 48 Edema Assessment Assessed: [Left: No] [Right: No] Edema: [Left: No] [Right: No] Calf Left: Right: Point of Measurement: 33 cm From Medial Instep 40 cm cm Ankle Left: Right: Point of Measurement: 10 cm From Medial Instep 23 cm cm Vascular Assessment Pulses: Dorsalis Pedis Palpable: [Left:Yes] Electronic Signature(s) Signed: 11/12/2019 11:26:15 AM By: Army Melia Entered By: Army Melia on 11/11/2019 12:53:31 [name], [name] -------------------------------------------------------------------------------- Multi Wound Chart Details Patient Name: [name], [name]. Date of Service: 11/11/2019 12:45 PM Medical Record Number: [id number] Patient Account Number: [id number] Date of Birth/Sex: Jun 18, 1946 (74 y.o. F) Treating RN: Cornell Barman Primary Care Khali Perella: [name] Other  Clinician: Referring Jalik Gellatly: [name] Treating Elysa Womac/Extender: Tito Dine in Treatment: 63 Vital Signs Height(in): 63 Pulse(bpm): 39 Weight(lbs): 224.7 Blood Pressure(mmHg): 141/65 Body Mass Index(BMI): 40 Temperature(F): 98.2 Respiratory Rate 16 (breaths/min): Photos: Wound Location: Left Lower Leg - Medial Left Lower Leg - Lateral Left Lower Leg - Lateral, Posterior Wounding Event: Gradually Appeared Gradually Appeared Gradually Appeared Primary Etiology: Lymphedema Venous Leg Ulcer Venous Leg Ulcer Comorbid History: Cataracts, Asthma, Sleep Cataracts, Asthma, Sleep Cataracts, Asthma, Sleep Apnea, Deep Vein Apnea, Deep Vein Apnea, Deep Vein Thrombosis, Hypertension, Thrombosis, Hypertension, Thrombosis,  Hypertension, Peripheral Venous Disease, Peripheral Venous Disease, Peripheral Venous Disease, Osteoarthritis, Received Osteoarthritis, Received Osteoarthritis, Received Chemotherapy, Received Chemotherapy, Received Chemotherapy, Received Radiation Radiation Radiation Date Acquired: 11/19/2018 01/19/2019 07/08/2019 Weeks of Treatment: 48 42 18 Wound Status: Open Open Open Measurements L x W x D 2.4x2.4x0.2 4.2x2.9x0.3 0.7x0.5x0.2 (cm) Area (cm) : 4.524 9.566 0.275 Volume (cm) : 0.905 2.87 0.055 % Reduction in Area: 47.10% -4248.20% 51.30% % Reduction in Volume: -5.80% -12945.50% 3.50% Classification: Full Thickness Without Full Thickness Without Full Thickness Without Exposed Support Structures Exposed Support Structures Exposed Support Structures Exudate Amount: Large Large Large Exudate Type: Serosanguineous Serosanguineous Serosanguineous Exudate Color: red, brown red, brown red, brown Wound Margin: Flat and Intact Flat and Intact Flat and Intact Granulation Amount: Medium (34-66%) Medium (34-66%) Large (67-100%) Granulation Quality: Pale Pink Pink Necrotic Amount: Medium (34-66%) Medium (34-66%) Small (1-33%) [name], [name]  ([id number]) Exposed Structures: Fat Layer (Subcutaneous Fat Layer (Subcutaneous Fat Layer (Subcutaneous Tissue) Exposed: Yes Tissue) Exposed: Yes Tissue) Exposed: Yes Fascia: No Fascia: No Fascia: No Tendon: No Tendon: No Tendon: No Muscle: No Muscle: No Muscle: No Joint: No Joint: No Joint: No Bone: No Bone: No Bone: No Epithelialization: None None None Debridement: Debridement - Excisional N/A N/A Pre-procedure 12:59 N/A N/A Verification/Time Out Taken: Pain Control: Lidocaine N/A N/A Tissue Debrided: Subcutaneous, Slough N/A N/A Level: Skin/Subcutaneous Tissue N/A N/A Debridement Area (sq cm): 5.76 N/A N/A Instrument: Curette N/A N/A Bleeding: Minimum N/A N/A Hemostasis Achieved: Pressure N/A N/A Debridement Treatment Procedure was tolerated well N/A N/A Response: Post Debridement 2.4x2.4x0.3 N/A N/A Measurements L x W x D (cm) Post Debridement Volume: 1.357 N/A N/A (cm) Procedures Performed: Debridement N/A N/A Treatment Notes Wound #5 (Left, Medial Lower Leg) Notes Sorbact to all wounds, drawtex, 3-Layer left, unna to anchor. TCA peri Wound #6 (Left, Lateral Lower Leg) Notes Sorbact to all wounds, drawtex, 3-Layer left, unna to anchor. TCA peri Wound #9 (Left, Lateral, Posterior Lower Leg) Notes Sorbact to all wounds, drawtex, 3-Layer left, unna to anchor. TCA peri Electronic Signature(s) Signed: 11/11/2019 4:45:14 PM By: Linton Ham MD Entered By: Linton Ham on 11/11/2019 13:18:41 [name], [name] -------------------------------------------------------------------------------- Multi-Disciplinary Care Plan Details Patient Name: EMILA, VEITH. Date of Service: 11/11/2019 12:45 PM Medical Record Number: [id number] Patient Account Number: [id number] Date of Birth/Sex: 14-Aug-1946 (74 y.o. F) Treating RN: Cornell Barman Primary Care Chessica Audia: [name] Other Clinician: Referring Sheenah Dimitroff: [name] Treating Yarissa Reining/Extender:  Tito Dine in Treatment: 6 Active Inactive Orientation to the Wound Care Program Nursing Diagnoses: Knowledge deficit related to the wound healing center program Goals: Patient/caregiver will verbalize understanding of the [facility] Date Initiated: 12/10/2018 Target Resolution Date: 01/09/2019 Goal Status: Active Interventions: Provide education on orientation to the wound center Notes: Soft Tissue Infection Nursing Diagnoses: Impaired tissue integrity Goals: Patient's soft tissue infection will resolve Date Initiated: 12/10/2018 Target Resolution Date: 01/09/2019 Goal Status: Active Interventions: Assess signs and symptoms of infection every visit Notes: Venous Leg Ulcer Nursing Diagnoses: Actual venous Insuffiency (use after diagnosis is confirmed) Goals: Patient will maintain optimal edema control Date Initiated: 12/10/2018 Target Resolution Date: 01/09/2019 Goal Status: Active Interventions: Assess peripheral edema status every visit. CHANCHAL, NIEHUES ([id number]) Treatment Activities: Therapeutic compression applied : 12/10/2018 Notes: Wound/Skin Impairment Nursing Diagnoses: Impaired tissue integrity Goals: Patient/caregiver will verbalize understanding of skin care regimen Date Initiated: 12/10/2018 Target Resolution Date: 01/09/2019 Goal Status: Active Interventions: Assess ulceration(s) every visit Treatment Activities: Topical wound management initiated : 12/10/2018 Notes: Electronic Signature(s)  Signed: 11/11/2019 4:51:02 PM By: [name], [name], RN, CWS, [name], [name] Entered By: [name], [name], RN, CWS, Kim on 11/11/2019 12:58:34 [name], [name] -------------------------------------------------------------------------------- Pain Assessment Details Patient Name: [name], [name]. Date of Service: 11/11/2019 12:45 PM Medical Record Number: [id number] Patient Account Number: [id number] Date of Birth/Sex: December 21, 1945 (74 y.o. F) Treating RN:  Cornell Barman Primary Care Havah Ammon: [name] Other Clinician: Referring Lofton Leon: [name] Treating Vannesa Abair/Extender: Tito Dine in Treatment: 48 Active Problems Location of Pain Severity and Description of Pain Patient Has Paino No Site Locations Pain Management and Medication Current Pain Management: Electronic Signature(s) Signed: 11/11/2019 3:30:55 PM By: Lorine Bears RCP, RRT, CHT Signed: 11/11/2019 4:51:02 PM By: [name], [name], RN, CWS, [name], [name] Entered By: Lorine Bears on 11/11/2019 12:46:21 [name] ([id number]) -------------------------------------------------------------------------------- Patient/Caregiver Education Details Patient Name: [name], [name]. Date of Service: 11/11/2019 12:45 PM Medical Record Number: [id number] Patient Account Number: [id number] Date of Birth/Gender: 10/12/1946 (74 y.o. F) Treating RN: Cornell Barman Primary Care Physician: [name] Other Clinician: Referring Physician: [name] Treating Physician/Extender: Tito Dine in Treatment: 55 Education Assessment Education Provided To: Patient Education Topics Provided Venous: Handouts: Controlling Swelling with Compression Stockings Methods: Demonstration, Explain/Verbal Responses: State content correctly Wound Debridement: Handouts: Wound Debridement Methods: Demonstration, Explain/Verbal Responses: State content correctly Electronic Signature(s) Signed: 11/11/2019 4:51:02 PM By: [name], [name], RN, CWS, [name], [name] Entered By: [name], [name], RN, CWS, Kim on 11/11/2019 13:01:42 [name] ([id number]) -------------------------------------------------------------------------------- Wound Assessment Details Patient Name: [name], [name]. Date of Service: 11/11/2019 12:45 PM Medical Record Number: [id number] Patient Account Number: [id number] Date of Birth/Sex: [birth year] (74 y.o. F) Treating RN: Army Melia Primary Care Kenner Lewan: [name] Other Clinician: Referring Demontez Novack: [name] Treating Klaus Casteneda/Extender: Tito Dine in Treatment: 48 Wound Status Wound Number: 5 Primary Lymphedema Etiology: Wound Location: Left Lower Leg - Medial Wound Open Wounding Event: Gradually Appeared Status: Date Acquired: 11/19/2018 Comorbid Cataracts, Asthma, Sleep Apnea, Deep Vein Weeks Of Treatment: 48 History: Thrombosis, Hypertension, Peripheral Venous Clustered Wound: No Disease, Osteoarthritis, Received Chemotherapy, Received Radiation Photos Wound Measurements Length: (cm) 2.4 Width: (cm) 2.4 Depth: (cm) 0.2 Area: (cm) 4.524 Volume: (cm) 0.905 % Reduction in Area: 47.1% % Reduction in Volume: -5.8% Epithelialization: None Tunneling: No Undermining: No Wound Description Full Thickness Without Exposed Support Classification: Structures Wound Margin: Flat and Intact Exudate Large Amount: Exudate Type: Serosanguineous Exudate Color: red, brown Foul Odor After Cleansing: No Slough/Fibrino Yes Wound Bed Granulation Amount: Medium (34-66%) Exposed Structure Granulation Quality: Pale Fascia Exposed: No Necrotic Amount: Medium (34-66%) Fat Layer (Subcutaneous Tissue) Exposed: Yes Necrotic Quality: Adherent Slough Tendon Exposed: No Muscle Exposed: No Joint Exposed: No Bone Exposed: No Kardell, Geni J. ([id number]) Treatment Notes Wound #5 (Left, Medial Lower Leg) Notes Sorbact to all wounds, drawtex, 3-Layer left, unna to anchor. TCA peri Electronic Signature(s) Signed: 11/12/2019 11:26:15 AM By: Army Melia Entered By: Army Melia on 11/11/2019 12:50:46 [name], CANGEMI ([id number]) -------------------------------------------------------------------------------- Wound Assessment Details Patient Name: ROSAMARY, KENKEL. Date of Service: 11/11/2019 12:45 PM Medical Record Number: [id number] Patient Account Number: [id number] Date of Birth/Sex:  04-21-1946 (74 y.o. F) Treating RN: Army Melia Primary Care Christyne [name]: [name] Other Clinician: Referring Quoc [name]: [name] Treating Temitope Flammer/Extender: Tito Dine in Treatment: 48 Wound Status Wound Number: 6 Primary Venous Leg Ulcer Etiology: Wound Location: Left Lower Leg - Lateral Wound Open Wounding Event: Gradually Appeared Status: Date Acquired: 01/19/2019 Comorbid Cataracts, Asthma, Sleep Apnea,  Deep Vein Weeks Of Treatment: 42 History: Thrombosis, Hypertension, Peripheral Venous Clustered Wound: No Disease, Osteoarthritis, Received Chemotherapy, Received Radiation Photos Wound Measurements Length: (cm) 4.2 Width: (cm) 2.9 Depth: (cm) 0.3 Area: (cm) 9.566 Volume: (cm) 2.87 % Reduction in Area: -4248.2% % Reduction in Volume: -12945.5% Epithelialization: None Wound Description Full Thickness Without Exposed Support Classification: Structures Wound Margin: Flat and Intact Exudate Large Amount: Exudate Type: Serosanguineous Exudate Color: red, brown Foul Odor After Cleansing: No Slough/Fibrino Yes Wound Bed Granulation Amount: Medium (34-66%) Exposed Structure Granulation Quality: Pink Fascia Exposed: No Necrotic Amount: Medium (34-66%) Fat Layer (Subcutaneous Tissue) Exposed: Yes Necrotic Quality: Adherent Slough Tendon Exposed: No Muscle Exposed: No Joint Exposed: No Bone Exposed: No Biegler, Harpreet J. ([id number]) Treatment Notes Wound #6 (Left, Lateral Lower Leg) Notes Sorbact to all wounds, drawtex, 3-Layer left, unna to anchor. TCA peri Electronic Signature(s) Signed: 11/12/2019 11:26:15 AM By: Army Melia Entered By: Army Melia on 11/11/2019 12:52:04 MATISYN, POST ([id number]) -------------------------------------------------------------------------------- Wound Assessment Details Patient Name: ANNISTEN, GILMOUR. Date of Service: 11/11/2019 12:45 PM Medical Record Number: [id number] Patient Account Number:  [id number] Date of Birth/Sex: 1946/08/04 (74 y.o. F) Treating RN: Army Melia Primary Care Angelize Ryce: [name] Other Clinician: Referring Jeptha Hinnenkamp: [name] Treating Rufus Cypert/Extender: Tito Dine in Treatment: 48 Wound Status Wound Number: 9 Primary Venous Leg Ulcer Etiology: Wound Location: Left Lower Leg - Lateral, Posterior Wound Open Wounding Event: Gradually Appeared Status: Date Acquired: 07/08/2019 Comorbid Cataracts, Asthma, Sleep Apnea, Deep Vein Weeks Of Treatment: 18 History: Thrombosis, Hypertension, Peripheral Venous Clustered Wound: No Disease, Osteoarthritis, Received Chemotherapy, Received Radiation Photos Wound Measurements Length: (cm) 0.7 Width: (cm) 0.5 Depth: (cm) 0.2 Area: (cm) 0.275 Volume: (cm) 0.055 % Reduction in Area: 51.3% % Reduction in Volume: 3.5% Epithelialization: None Wound Description Full Thickness Without Exposed Support Classification: Structures Wound Margin: Flat and Intact Exudate Large Amount: Exudate Type: Serosanguineous Exudate Color: red, brown Foul Odor After Cleansing: No Slough/Fibrino Yes Wound Bed Granulation Amount: Large (67-100%) Exposed Structure Granulation Quality: Pink Fascia Exposed: No Necrotic Amount: Small (1-33%) Fat Layer (Subcutaneous Tissue) Exposed: Yes Necrotic Quality: Adherent Slough Tendon Exposed: No Muscle Exposed: No Joint Exposed: No Bone Exposed: No Melchor, Jakaiya J. ([id number]) Treatment Notes Wound #9 (Left, Lateral, Posterior Lower Leg) Notes Sorbact to all wounds, drawtex, 3-Layer left, unna to anchor. TCA peri Electronic Signature(s) Signed: 11/12/2019 11:26:15 AM By: Army Melia Entered By: Army Melia on 11/11/2019 12:52:22 JAQUANDA, PIROLLI ([id number]) -------------------------------------------------------------------------------- Vitals Details Patient Name: [name], [name]. Date of Service: 11/11/2019 12:45 PM Medical Record Number:  [id number] Patient Account Number: [id number] Date of Birth/Sex: [birth year] (74 y.o. F) Treating RN: Cornell Barman Primary Care Gor Vestal: [name] Other Clinician: Referring Willa Brocks: [name] Treating Lynleigh [name]/Extender: Tito Dine in Treatment: 48 Vital Signs Time Taken: 12:45 Temperature (F): 98.2 Height (in): 63 Pulse (bpm): 69 Weight (lbs): 224.7 Respiratory Rate (breaths/min): 16 Body Mass Index (BMI): 39.8 Blood Pressure (mmHg): 141/65 Reference Range: 80 - 120 mg / dl Electronic Signature(s) Signed: 11/11/2019 3:30:55 PM By: Lorine Bears RCP, RRT, CHT Entered By: Lorine Bears on 11/11/2019 12:48:05

## 2019-11-15 ENCOUNTER — Other Ambulatory Visit: Payer: Self-pay | Admitting: Family Medicine

## 2019-11-18 ENCOUNTER — Encounter: Payer: Medicare Other | Admitting: Internal Medicine

## 2019-11-18 ENCOUNTER — Other Ambulatory Visit: Payer: Self-pay

## 2019-11-18 DIAGNOSIS — L97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d: Secondary | ICD-10-CM | POA: Diagnosis not present

## 2019-11-18 DIAGNOSIS — I872 Venous insufficiency (chronic) (peripheral): Secondary | ICD-10-CM | POA: Diagnosis not present

## 2019-11-18 DIAGNOSIS — L97222 Non-pressure chronic ulcer of left calf with fat layer exposed: Secondary | ICD-10-CM | POA: Diagnosis not present

## 2019-11-18 DIAGNOSIS — I890 Lymphedema, not elsewhere classified: Secondary | ICD-10-CM | POA: Diagnosis not present

## 2019-11-18 DIAGNOSIS — L97818 Non-pressure chronic ulcer of other part of right lower leg with other specified severity: Secondary | ICD-10-CM | POA: Diagnosis not present

## 2019-11-18 DIAGNOSIS — G4733 Obstructive sleep apnea (adult) (pediatric): Secondary | ICD-10-CM | POA: Diagnosis not present

## 2019-11-18 DIAGNOSIS — I87312 Chronic venous hypertension (idiopathic) with ulcer of left lower extremity: Secondary | ICD-10-CM | POA: Diagnosis not present

## 2019-11-18 DIAGNOSIS — I87321 Chronic venous hypertension (idiopathic) with inflammation of right lower extremity: Secondary | ICD-10-CM | POA: Diagnosis not present

## 2019-11-18 DIAGNOSIS — L97221 Non-pressure chronic ulcer of left calf limited to breakdown of skin: Secondary | ICD-10-CM | POA: Diagnosis not present

## 2019-11-18 NOTE — Progress Notes (Signed)
KANIA, HORBAL ([id number]) Visit Report for 11/18/2019 HPI Details Patient Name: [name], [name]. Date of Service: 11/18/2019 12:45 PM Medical Record Number: [id number] Patient Account Number: [id number] Date of Birth/Sex: 1946-08-01 (74 y.o. F) Treating RN: Cornell Barman Primary Care Provider: [name] Other Clinician: Referring Provider: [name] Treating Provider/Extender: Tito Dine in Treatment: 86 History of Present Illness HPI Description: Pleasant 74 year old with history of chronic venous insufficiency. No diabetes or peripheral vascular disease. Left ABI 1.29. Questionable history of left lower extremity DVT. She developed a recurrent ulceration on her left lateral calf in December 2015, which she attributes to poor diet and subsequent lower extremity edema. She underwent endovenous laser ablation of her left greater saphenous vein in 2010. She underwent laser ablation of accessory branch of left GSV in April 2016 by Dr. Kellie Simmering at [facility]. She was previously wearing Unna boots, which she tolerated well. Tolerating 2 layer compression and cadexomer iodine. She returns to clinic for follow-up and is without new complaints. She denies any significant pain at this time. She reports persistent pain with pressure. No claudication or ischemic rest pain. No fever or chills. No drainage. READMISSION 11/13/16; this is a 74 year old woman who is not a diabetic. She is here for a review of a painful area on her left medial lower extremity. I note that she was seen here previously last year for wound I believe to be in the same area. At that time she had undergone previously a left greater saphenous vein ablation by Dr. Kellie Simmering and she had a ablation of the anterior accessory branch of the left greater saphenous vein in March 2016. Seeing that the wound actually closed over. In reviewing the history with her today the ulcer in this area has been recurrent. She  describes a biopsy of this area in 2009 that only showed stasis physiology. 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 Do not see that she has ever had arterial studies in the left leg. She tells me she has a follow-up with Dr. Kellie Simmering in roughly 10 days In any case she developed the reopening of this area roughly a month ago. On the background of this she describes rapidly increasing edema which has responded to Lasix 40 mg and metolazone 2.5 mg as well as the patient's lymph massage. She has been told she has both venous insufficiency and lymphedema but she cannot tolerate compression stockings 11/28/16; the patient saw Dr. Kellie Simmering recently. Per the patient he did arterial Dopplers in the office that did not show evidence of arterial insufficiency, per the patient he stated "treat this like an ordinary venous ulcer". She also saw her dermatologist Dr. Ronnald Ramp who felt that this was more of a vascular ulcer. In general things are improving although she arrives today with increasing bilateral lower extremity edema with weeping a deeper fluid through the wound on the left medial leg compatible with some degree of lymphedema 12/04/16; the patient's wound is fully epithelialized but I don't think fully healed. We will do another week of depression with Promogran and TCA however I suspect we'll be able to discharge her next week. This is a very unusual-looking wound which was initially a figure-of-eight type wound lying on its side surrounded by petechial like hemorrhage. She  has had venous ablation on this side. She apparently does not have an arterial issue per Dr. Kellie Simmering. She saw her dermatologist thought it was "vascular". Patient is definitely going to need ongoing compression and I  talked about this with her today she will go to elastic therapy after she leaves here next week 12/11/16; the patient's wound is not completely closed today. She has surrounding scar tissue and in further discussion with the patient it would appear that she had ulcers in this area in 2009 for a prolonged period of time ultimately requiring a punch biopsy of this area that only showed venous insufficiency. I did not previously pickup on this part of the history from the patient. 12/18/16; the patient's wound is completely epithelialized. There is no open area here. She has significant bilateral venous insufficiency with secondary lymphedema to a mild-to-moderate degree she does not have compression stockings.. She did not say anything to me when I was in the room, she told our intake nurse that she was still having pain in this area. This isn't PATIA, BREIDINGER ([id number]) unusual recurrent small open area. She is going to go to elastic therapy to obtain compression stockings. 12/25/16; the patient's wound is fully epithelialized. There is no open area here. The patient describes some continued episodic discomfort in this area medial left calf. However everything looks fine and healed here. She is been to elastic therapy and caught herself 15-20 mmHg stockings, they apparently were having trouble getting 20-30 mm stockings in her size 01/22/17; this is a patient we discharged from the clinic a month ago. She has a recurrent open wound on her medial left calf. She had 15 mm support stockings. I told her I thought she needed 20-30 mm compression stockings. She tells me that she has been ill with hospitalization secondary to asthma and is been found to have severe hypokalemia likely secondary to a combination of Lasix and metolazone. This morning she noted blistering and leaking fluid on the posterior part of her left leg. She called our intake nurse urgently and we was saw her this afternoon. She has not  had any real discomfort here. I don't know that she's been wearing any stockings on this leg for at least 2-3 days. ABIs in this clinic were 1.21 on the right and 1.3 on the left. She is previously seen vascular surgery who does not think that there is a peripheral arterial issue. 01/30/17; Patient arrives with no open wound on the left leg. She has been to elastic therapy and obtained 20-39mmhg below knee stockings and she has one on the right leg today. READMISSION 02/19/18; this Bobier is a now 74 year old patient we've had in this clinic perhaps 3 times before. I had last looked at her from January 07 December 2016 with an area on the medial left leg. We discharged her on 12/25/16 however she had to be readmitted on 01/22/17 with a recurrence. I have in my notes that we discharged her on 20-30 mm stockings although she tells me she was only wearing support hose because she cannot get stockings on predominantly related to her cervical spine surgery/issues. She has had previous ablations done by vein and vascular in [place] including a great saphenous vein ablation on the left with an anterior accessory branch ablation I think both of these were in 2016. On one of the previous visit she had a biopsy noted 2009 that was negative. She is not felt to have an arterial issue. She is not  a diabetic. She does have a history of obstructive sleep apnea hypertension asthma as well as chronic venous insufficiency and lymphedema. On this occasion she noted 2 dry scaly patch on her left leg. She tried to put lotion on this it didn't really help. There were 2 open areas.the patient has been seeing her primary physician from 02/05/18 through 02/14/18. She had Unna boots applied. The superior wound now on the lateral left leg has closed but she's had one wound that remains open on the lateral left leg. This is not the same spot as we dealt with in 2018. ABIs in this clinic were 1.3 bilaterally 02/26/18; patient has a  small wound on the left lateral calf. Dimensions are down. She has chronic venous insufficiency and lymphedema. 03/05/18; small open area on the left lateral calf. Dimensions are down. Tightly adherent necrotic debris over the surface of the wound which was difficult to remove. Also the dressing [over collagen] stuck to the wound surface. This was removed with some difficulty as well. Change the primary dressing to Hydrofera Blue ready 03/12/18; small open area on the left lateral calf. Comes in with tightly adherent surface eschar as well as some adherent Hydrofera Blue. 03/19/18; open area on the left lateral calf. Again adherent surface eschar as well as some adherent Hydrofera Blue nonviable subcutaneous tissue. She complained of pain all week even with the reduction from 4-3 layer compression I put on last week. Also she had an increase in her ankle and calf measurements probably related to the same thing. 03/26/18; open area on the left lateral calf. A very small open area remains here. We used silver alginate starting last week as the Hydrofera Blue seem to stick to the wound bed. In using 4-layer compression 04/02/18; the open area in the left lateral calf at some adherent slough which I removed there is no open area here. We are able to transition her into her own compression stocking. Truthfully I think this is probably his support hose. However this does not maintain skin integrity will be limited. She cannot put over the toe compression stockings on because of neck problems hand problems etc. She is allergic to the lining layer of juxta lites. We might be forced to use extremitease stocking should this fail READMIT 11/24/2018 Patient is now a 74 year old woman who is not a diabetic. She has been in this clinic on at least 3 previous occasions largely with recurrent wounds on her left leg secondary to chronic venous insufficiency with secondary lymphedema. Her situation is complicated by  inability to get stockings on and an allergy to neoprene which is apparently a component and at least juxta lites and other stockings. As a result she really has not been wearing any stockings on her legs. She tells [place] that roughly 2 or 3 weeks ago she started noticing a stinging sensation just above her ankle on the left medial aspect. She has been diagnosed with pseudogout and she wondered whether this was what she was experiencing. She tried to dress this with something she bought at the store however subsequently it pulled skin off and now she has an open wound that is not improving. She has been using Vaseline gauze with a cover bandage. She saw her primary doctor last week who AFSA, BRUNKEN ([id number]) put an Unna boot on her. ABIs in this clinic was 1.03 on the left 2/12; the area is on the left medial ankle. Odd-looking wound with what looks to be surface epithelialization  but a multitude of small petechial openings. This clearly not closed yet. We have been using silver alginate under 3 layer compression with TCA 2/19; the wound area did not look quite as good this week. Necrotic debris over the majority of the wound surface which required debridement. She continues to have a multitude of what looked to be small petechial openings. She reminds [place] that she had a biopsy on this initially during her first outbreak in 2015 in [place] dermatology. She expresses concern about this being a possible melanoma. 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 I assured her that this does not look anything like melanoma. Except for the petechial reaction it does look like a venous insufficiency area and she certainly has evidence of this on both sides 2/26; a difficult area on the left medial ankle. The patient clearly has chronic venous hypertension with some degree of lymphedema. The odd thing about the area is  the small petechial hemorrhages. I am not really sure how to explain this. This was present last time and this is not a compression injury. We have been using Hydrofera Blue which I changed to last week 3/4; still using Hydrofera Blue. Aggressive debridement today. She does not have known arterial issues. She has seen Dr. Kellie Simmering at [facility] vein [facility] vascular and and has an ablation on the left. [Anterior accessory branch of the greater saphenous]. From what I remember they did not feel she had an arterial issue. The patient has had this area biopsied in 2009 at [facility] dermatology and by her recollection they said this was "stasis". She is also follow-up with dermatology locally who thought that this was more of a vascular issue 3/11; using Hydrofera Blue. Aggressive debridement today. She does not have an arterial issue. We are using 3 layer compression although we may need to go to 4. The patient has been in for multiple changes to her wrap since I last saw her a week ago. She says that the area was leaking. I do not have too much more information on what was found 01/19/19 on evaluation today patient was actually being seen for a nurse visit when unfortunately she had the area on her left lateral lower extremity as well as weeping from the right lower extremity that became apparent. Therefore we did end up actually seeing her for a full visit with myself. She is having some pain at this site as well but fortunately nothing too significant at this point. No fevers, chills, nausea, or vomiting noted at this time. 3/18-Patient is back to the clinic with the left leg venous leg ulcer, the ulcer is larger in size, has a surface that is densely adherent with fibrinous tissue, the Hydrofera Blue was used but is densely adherent and there was difficulty in removing it. The right lower extremity was also wrapped for weeping edema. Patient has a new area over the left lateral foot above the malleolus  that is small and appears to have no debris with intact surrounding skin. Patient is on increased dose of Lasix also as a means to edema management 3/25; the patient has a nonhealing venous ulcer on the medial left leg and last week developed a smaller area on the lateral left calf. We have been using Hydrofera Blue with a contact layer. 4/1; no major change in these wounds areas. Left medial and more recently left lateral calf. I tried Iodoflex last week  to aid in debridement she did not tolerate this. She stated her pain was terrible all week. She took the top layer of the 4 layer compression off. 4/8; the patient actually looks somewhat better in terms of her more prominent left lateral calf wound. There is some healthy looking tissue here. She is still complaining of a lot of discomfort. 4/15; patient in a lot of pain secondary to sciatica. She is on a prednisone taper prescribed by her primary physician. She has the 2 areas one on the left medial and more recently a smaller area on the left lateral calf. Both of these just above the malleoli 4/22; her back pain is better but she still states she is very uncomfortable and now feels she is intolerant to the [employer]. No real change in the wounds we have been using Sorbact. She has been previously intolerant to Iodoflex. There is not a lot of option about what we can use to debride this wound under compression that she no doubt needs. sHe states Ultram no longer works for her pain 4/29; no major change in the wounds slightly increased depth. Surface on the original medial wound perhaps somewhat improved however the more recent area on the lateral left ankle is 100% covered in very adherent debris we have been using Sorbact. She tolerates 4 layer compression well and her edema control is a lot better. She has not had to come in for a nurse check 5/6; no major change in the condition of the wounds. She did consent to debridement today which was  done with some difficulty. Continuing Sorbact. She did not tolerate Iodoflex. She was in for a check of her compression the day after we wrapped her last week this was adjusted but nothing much was found 5/13; no major change in the condition or area of the wounds. I was able to get a fairly aggressive debridement done on the lateral left leg wound. Even using Sorbact under compression. She came back in on Friday to have the wrap changed. She says she felt uncomfortable on the lateral aspect of her ankle. She has a long history of chronic venous insufficiency including previous ablation surgery on this side. 5/20-Patient returns for wounds on left leg with both wounds covered in slough, with the lateral leg wound larger in size, she has been in 3 layer compression and felt more comfortable, she describes pain in ankle, in leg and pins and needles in foot, Mencer, Rosalia J. ([id number]) and is about to try Pamelor for this 6/3; wounds on the left lateral and left medial leg. The area medially which is the most recent of the 2 seems to have had the largest increase in dimensions. We have been using Sorbac to try and debride the surface. She has been to see orthopedics they apparently did a plain x-ray that was indeterminant. Diagnosed her with neuropathy and they have ordered an MRI to determine if there is underlying osteomyelitis. This was not high on my thought list but I suppose it is prudent. We have advised her to make an appointment with vein and vascular in [place]. She has a history of a left greater saphenous and accessory vein ablations I wonder if there is anything else that can be done from a surgical point of view to help in these difficult refractory wounds. We have previously healed this wound on one occasion but it keeps on reopening [medial side] 6/10; deep tissue culture I did last week I think on the  left medial wound showed both moderate E. coli and moderate staph aureus  [MSSA]. She is going to require antibiotics and I have chosen Augmentin. We have been using Sorbact and we have made better looking wound surface on both sides but certainly no improvement in wound area. She was back in last Friday apparently for a dressing changes the wrap was hurting her outer left ankle. She has not managed to get a hold of vein and vascular in [place]. We are going to have to make her that appointment 6/17; patient is tolerating the Augmentin. She had an MRI that I think was ordered by orthopedic surgeon this did not show osteomyelitis or an abscess did suggest cellulitis. We have been using Sorbact to the lateral and medial ankles. We have been trying to arrange a follow-up appointment with vein and vascular in [place] or did her original ablations. We apparently an area sent the request to vein and vascular in [facility] 6/24; patient has completed the Augmentin. We do not yet have a vein and vascular appointment in [place]. I am not sure what the issue is here we have asked her to call tomorrow. We are using Sorbact. Making some improvements and especially the medial wound. Both surfaces however look better medial and lateral. 7/1; the patient has been in contact with vein and vascular in [place] but has not yet received an appointment. Using Sorbact we have gradually improve the wound surface with no improvement in surface area. She is approved for Apligraf but the wound surface still is not completely viable. She has not had to come in for a dressing change 7/8; the patient has an appointment with vein and vascular on 7/31 which is a Friday afternoon. She is concerned about getting back here for [place] to dress her wounds. I think it is important to have them goal for her venous reflux/history of ablations etc. to see if anything else can be done. She apparently tested positive for 1 of the blood tests with regards to lupus and saw a rheumatologist. He has raised  the issue of vasculitis again. I have had this thought in the past however the evidence seems overwhelming that this is a venous reflux etiology. If the rheumatologist tells me there is clinical and laboratory investigation is positive for lupus I will rethink this. 7/15; the patient's wound surfaces are quite a bit better. The medial area which was her original wound now has no depth although the lateral wound which was the more recent area actually appears larger. Both with viable surfaces which is indeed better. Using Sorbact. I wanted to use Apligraf on her however there is the issue of the vein and vascular appointment on 7/31 at 2:00 in the afternoon which would not allow her to get back to be rewrapped and they would no doubt remove the graft 7/22; the patient's wound surfaces have moderate amount of debris although generally look better. The lateral one is larger with 2 small satellite areas superiorly. We are waiting for her vein and vascular appointment on 7/31. She has been approved for Apligraf which I would like to use after th 7/29; wound surfaces have improved no debridement is required we have been using Sorbact. She sees vein and vascular on Friday with this so question of whether anything can be done to lessen the likelihood of recurrence and/or speed the healing of these areas. She is already had previous ablations. She no doubt has severe venous hypertension 8/5-Patient returns at 1 week, she  was in [employer] for 3 days by her podiatrist, we have been using so backed to the wound, she has increased pain in both the wounds on the left lower leg especially the more distal one on the lateral aspect 8/12-Patient returns at 1 week and she is agreeable to having debridement in both wounds on her left leg today. We have been using Sorbact, and vascular studies were reviewed at last visit 8/19; the patient arrives with her wounds fairly clean and no debridement is required. We have used  Sorbact which is really done a nice job in cleaning up these very difficult wound surfaces. The patient saw Dr. Donzetta Matters of vascular surgery on 7/31. He did not feel that there was an arterial component. He felt that her treated greater saphenous vein is adequately addressed and that the small saphenous vein did not appear to be involved significantly. She was also noted to have deep venous reflux which is not treatable. Dr. Donzetta Matters mentioned the possibility of a central obstructive component leading to reflux and he offered her central venography. She wanted to discuss this or think about it. I have urged her to go ahead with this. She has had recurrent difficult wounds in these areas which do heal but after months in the clinic. If there is anything that can be done to reduce the likelihood of this I think it is worth it. 9/2 she is still working towards getting follow-up with Dr. Donzetta Matters to schedule her CT. Things are quite a bit worse venography. I put Apligraf on 2 weeks ago on both wounds on the medial and lateral part of her left lower leg. She arrives in clinic today with 3 superficial additional wounds above the area laterally and one below the wound medially. She describes a lot of discomfort. I think these are probably wrapped injuries. Does not look like she has cellulitis. 07/20/2019 on evaluation today patient appears to be doing somewhat poorly in regard to her lower extremity ulcers. She in fact showed signs of erythema in fact we may even be dealing with an infection at this time. Unfortunately I am unsure if this TRUE, HINGLE ([id number]) is just infection or if indeed there may be some allergic reaction that occurred as a result of the Apligraf application. With that being said that would be unusual but nonetheless not impossible in this patient is one who is unfortunately allergic to quite a bit. Currently we have been using the Sorbact which seems to do as well as anything for her. I do  think we may want to obtain a culture today to see if there is anything showing up there that may need to be addressed. 9/16; noted that last week the wounds look worse in 1 week follow-up of the Apligraf. Using Sorbact as of 2 days ago. She arrives with copious amounts of drainage and new skin breakdown on the back of the left calf. The wounds arm more substantial bilaterally. There is a fair amount of swelling in the left calf no overt DVT there is edema present I think in the left greater than right thigh. She is supposed to go on 9/28 for CT venography. The wounds on the medial and lateral calf are worse and she has new skin breakdown posteriorly at least new for me. This is almost developing into a circumferential wound area The Apligraf was taken off last week which I agree with things are not going in the right direction a culture was done we  do not have that back yet. She is on Augmentin that she started 2 days ago 9/23; dressing was changed by her nurses on Monday. In general there is no improvement in the wound areas although the area looks less angry than last week. She did get Augmentin for MSSA cultured on the 14th. She still appears to have too much swelling in the left leg even with 3 layer compression 9/30; the patient underwent her procedure on 9/28 by Dr. Donzetta [facility]. She was discovered to have the common iliac vein measuring 12.2 mm but at the level of L4-L5 measured 3 mm. After stenting it measured 10 mm. 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 Rouleaux flow in the common femoral and femoral vein was observed much improved after stenting. We are using silver alginate to the wounds on the medial and lateral ankle on the left. 4 layer compression 10/7; the patient had fluid swelling around her knee and 4 layer compression. At the advice of vein and vascular this was reduced to 3 layer which she is tolerating better. We have been using  silver alginate under 3 layer compression since last Friday 10/14; arrives with the areas on the left ankle looking a lot better. Inflammation in the area also a lot better. She came in for a nurse check on 10/9 10/21; continued nice improvement. Slight improvements in surface area of both the medial and lateral wounds on the left. A lot of the satellite lesions in the weeping erythema around these from stasis dermatitis is resolved. We have been using silver alginate 10/28; general improvement in the entire wound areas although not a lot of change in dimensions the wound certainly looks better. There is a lot less in terms of venous inflammation. Continue silver alginate this week however look towards Hydrofera Blue next week 11/4; very adherent debris on the medial wound left wound is not as bad. We have been using silver alginate. Change to [facility] today 11/11; very adherent debris on both wound areas. She went to vein and vascular last week and follow-up they put in [place] boot on this today. He says the [facility] was adherent. Wound is definitely not as good as last week. Especially on the left there the satellite lesions look more prominent 11/18; absolutely no better. erythema on lateral aspect with tenderness. 09/30/2019 on evaluation today patient appears to actually be doing better. Dr. Dellia Nims did put her on doxycycline last week which I do believe has helped her at this point. Fortunately there is no signs of active infection at this time. No fevers, chills, nausea, vomiting, or diarrhea. I do believe he may want extend the doxycycline for 7 additional days just to ensure everything does completely cleared up the patient is in agreement with that plan. Otherwise she is going require some sharp debridement today 12/2; patient is completing a 2-week course of doxycycline. I gave her this empirically for inflammation as well as infection when I last saw her 2 weeks ago. All of  this seems to be better. She is using silver alginate she has the area on the medial aspect of the larger area laterally and the 2 small satellite regions laterally above the major wound. 12/9; the patient's wound on the left medial and left lateral calf look really quite good. We have been using silver alginate. She saw vein and vascular in follow-up on 10/09/2019. She has had a previous left greater saphenous vein ablation by  Dr. Oscar La in 2016. More recently she underwent a left common iliac vein stent by Dr. 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 The swelling is improved and certainly the wounds have improved. The patient shows [place] today area on the right medial calf there is almost no wound but leaking lymphedema. She says she start this started 3 or 4 days ago. She did not traumatize it. It is not painful. She does not wear compression on that side 12/16; the patient continues to do well laterally. Medially still requiring debridement. The area on the right calf did not materialize to anything and is not currently open. We wrapped this last time. She has support stockings for that leg although I am not sure they are going to provide adequate compression 12/23; the lateral wound looks stable. Medially still requiring debridement for tightly adherent fibrinous debris. We've been MARKEYA, BOAG ([id number]) using silver alginate. Surface area not any different 12/30; neither wound is any better with regards to surface and the area on the left lateral is larger. I been using silver alginate to the left lateral which look quite good last week and Sorbact to the left medial 11/11/2019. Lateral wound area actually looks better and somewhat smaller. Medial still requires a very aggressive debridement today. We have been using Sorbact on both wound areas 1/13; not much better still adherent debris bilaterally. I been using Sorbact. She has severe venous hypertension. Probably some degree of  dermal fibrosis distally. I wonder whether tighter compression might help and I am going to try that today. We also need to work on the bioburden [employer]) Signed: 11/18/2019 4:15:01 PM By: Linton Ham MD Entered By: Linton Ham on 11/18/2019 13:10:01 ALIE, BALDINGER ([id number]) -------------------------------------------------------------------------------- Physical Exam Details Patient Name: DAVEN, HOCHMAN. Date of Service: 11/18/2019 12:45 PM Medical Record Number: [id number] Patient Account Number: [id number] Date of Birth/Sex: 06/15/1946 (75 y.o. F) Treating RN: Cornell Barman Primary Care Provider: [name] Other Clinician: Referring Provider: [name] Treating Provider/Extender: Tito Dine in Treatment: 70 Constitutional Sitting or standing Blood Pressure is within target range for patient.. Pulse regular and within target range for patient.Marland Kitchen Respirations regular, non-labored and within target range.Marland Kitchen appears in no distress. Respiratory Respiratory effort is easy and symmetric bilaterally. Rate is normal at rest and on room air.. Cardiovascular Pedal pulses palpable. He has nonpitting edema bilaterally in the mid to upper calf and tightly fibrotic distal skin where her wounds are on the lower calf and ankle. Severe venous hypertension. Integumentary (Hair, Skin) There is no erythema around the wound. Psychiatric No evidence of depression, anxiety, or agitation. Calm, cooperative, and communicative. Appropriate interactions and affect.. Notes Wound exam oBoth areas on the lateral and medial ankle are about the same condition although the left is somewhat better. There is still tightly adherent fibrinous debris. She tolerates chemical debridement very poorly. This will not wash off Electronic Signature(s) Signed: 11/18/2019 4:15:01 PM By: Linton Ham MD Entered By: Linton Ham on 11/18/2019 13:19:04 Suttles, [name]  ([id number]) -------------------------------------------------------------------------------- Physician Orders Details Patient Name: EULINE, MCCORY. Date of Service: 11/18/2019 12:45 PM Medical Record Number: [id number] Patient Account Number: [id number] Date of Birth/Sex: 22-Nov-1945 (74 y.o. F) Treating RN: Cornell Barman Primary Care Provider: [name] Other Clinician: Referring Provider: [name] Treating Provider/Extender: Tito Dine in Treatment: 72 Verbal / Phone Orders: No Diagnosis Coding Wound Cleansing Wound #5 Left,Medial Lower Leg o Cleanse wound with mild soap and water Wound #6  Left,Lateral Lower Leg o Cleanse wound with mild soap and water Wound #9 Left,Lateral,Posterior Lower Leg o Cleanse wound with mild soap and water Anesthetic (add to Medication List) Wound #5 Left,Medial Lower Leg o Topical Lidocaine 4% cream applied to wound bed prior to debridement (In Clinic Only). Wound #6 Left,Lateral Lower Leg o Topical Lidocaine 4% cream applied to wound bed prior to debridement (In Clinic Only). Wound #9 Left,Lateral,Posterior Lower Leg o Topical Lidocaine 4% cream applied to wound bed prior to debridement (In Clinic Only). Skin Barriers/Peri-Wound Care Wound #5 Left,Medial Lower Leg o Other: - Gentamycin Wound #6 Left,Lateral Lower Leg o Other: - Gentamycin Wound #9 Left,Lateral,Posterior Lower Leg o Other: - Gentamycin Primary Wound Dressing Wound #5 Left,Medial Lower Leg o Other: - sorbact Wound #6 Left,Lateral Lower Leg o Other: - sorbact Wound #9 Left,Lateral,Posterior Lower Leg o Other: - sorbact Secondary Dressing Wound #5 Left,Medial Lower Leg o Drawtex KIRSTA, HARMAN. ([id number]) Wound #6 Left,Lateral Lower Leg o Drawtex Wound #9 Left,Lateral,Posterior Lower Leg o Drawtex Dressing Change Frequency Wound #5 Left,Medial Lower Leg o Change dressing every week o Other: - as needed. Wound  #6 Left,Lateral Lower Leg o Change dressing every week o Other: - as needed. Wound #9 Left,Lateral,Posterior Lower Leg o Change dressing every week o Other: - as needed. Follow-up Appointments Wound #5 Left,Medial Lower Leg o Return Appointment in 1 week. o Nurse Visit as needed - Call of needed Wound #6 Left,Lateral Lower Leg o Return Appointment in 1 week. o Nurse Visit as needed - Call of needed Wound #9 Left,Lateral,Posterior Lower Leg o Return Appointment in 1 week. o Nurse Visit as needed - Call of needed Edema Control Wound #5 Left,Medial Lower Leg o 4-Layer Compression System - Left Lower Extremity. Wound #6 Left,Lateral Lower Leg o 4-Layer Compression System - Left Lower Extremity. Wound #9 Left,Lateral,Posterior Lower Leg o 4-Layer Compression System - Left Lower Extremity. Electronic Signature(s) Signed: 11/18/2019 4:15:01 PM By: Linton Ham MD Signed: 11/18/2019 4:52:26 PM By: [name], [name], RN, CWS, [name], [name] Entered By: [name], [name], RN, CWS, Kim on 11/18/2019 13:05:55 RANAE, KIDWELL ([id number]) -------------------------------------------------------------------------------- Problem List Details Patient Name: KYLIE, HALBROOKS. Date of Service: 11/18/2019 12:45 PM Medical Record Number: [id number] Patient Account Number: [id number] Date of Birth/Sex: [birth year] (74 y.o. F) Treating RN: Cornell Barman Primary Care Provider: [name] Other Clinician: Referring Provider: [name] Treating Provider/Extender: Tito Dine in Treatment: 77 Active Problems ICD-10 Evaluated Encounter Code Description Active Date Today Diagnosis L97.221 Non-pressure chronic ulcer of left calf limited to breakdown of 01/07/2019 No Yes skin I87.321 Chronic venous hypertension (idiopathic) with inflammation of 12/10/2018 No Yes right lower extremity I89.0 Lymphedema, not elsewhere classified 12/10/2018 No Yes L97.818 Non-pressure chronic ulcer  of other part of right lower leg 10/14/2019 No Yes with other specified severity Inactive Problems Resolved Problems Electronic Signature(s) Signed: 11/18/2019 4:15:01 PM By: Linton Ham MD Entered By: Linton Ham on 11/18/2019 13:08:34 [name] -------------------------------------------------------------------------------- Progress Note Details Patient Name: CHANEY, OLIGER. Date of Service: 11/18/2019 12:45 PM Medical Record Number: [id number] Patient Account Number: [id number] Date of Birth/Sex: [birth year] (74 y.o. F) Treating RN: Cornell Barman Primary Care Provider: [name] Other Clinician: Referring Provider: [name] Treating Provider/Extender: Tito Dine in Treatment: 8 Subjective History of Present Illness (HPI) Pleasant 74 year old with history of chronic venous insufficiency. No diabetes or peripheral vascular disease. Left ABI 1.29. Questionable history of left lower extremity DVT. She developed a recurrent ulceration on  her left lateral calf in December 2015, which she attributes to poor diet and subsequent lower extremity edema. She underwent endovenous laser ablation of her left greater saphenous vein in 2010. She underwent laser ablation of accessory branch of left GSV in April 2016 by Dr. Kellie Simmering at [facility]. She was previously wearing Unna boots, which she tolerated well. Tolerating 2 layer compression and cadexomer iodine. She returns to clinic for follow-up and is without new complaints. She denies any significant pain at this time. She reports persistent pain with pressure. No claudication or ischemic rest pain. No fever or chills. No drainage. READMISSION 11/13/16; this is a 74 year old woman who is not a diabetic. She is here for a review of a painful area on her left medial lower extremity. I note that she was seen here previously last year for wound I believe to be in the same area. At that time she  had undergone previously a left greater saphenous vein ablation by Dr. Kellie Simmering and she had a ablation of the anterior accessory branch of the left greater saphenous vein in March 2016. Seeing that the wound actually closed over. In reviewing the history with her today the ulcer in this area has been recurrent. She describes a biopsy of this area in 2009 that only showed stasis physiology. 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 Do not see that she has ever had arterial studies in the left leg. She tells me she has a follow-up with Dr. Kellie Simmering in roughly 10 days In any case she developed the reopening of this area roughly a month ago. On the background of this she describes rapidly increasing edema which has responded to Lasix 40 mg and metolazone 2.5 mg as well as the patient's lymph massage. She has been told she has both venous insufficiency and lymphedema but she cannot tolerate compression stockings 11/28/16; the patient saw Dr. Kellie Simmering recently. Per the patient he did arterial Dopplers in the office that did not show evidence of arterial insufficiency, per the patient he stated "treat this like an ordinary venous ulcer". She also saw her dermatologist Dr. Ronnald Ramp who felt that this was more of a vascular ulcer. In general things are improving although she arrives today with increasing bilateral lower extremity edema with weeping a deeper fluid through the wound on the left medial leg compatible with some degree of lymphedema 12/04/16; the patient's wound is fully epithelialized but I don't think fully healed. We will do another week of depression with Promogran and TCA however I suspect we'll be able to discharge her next week. This is a very unusual-looking wound which was  initially a figure-of-eight type wound lying on its side surrounded by petechial like hemorrhage. She has had venous ablation on this side. She apparently does not have an arterial issue per Dr. Kellie Simmering. She saw her dermatologist thought it was "vascular". Patient is definitely going to need ongoing compression and I talked about this with her today she will go to elastic therapy after she leaves here next week 12/11/16; the patient's wound is not completely closed today. She has surrounding scar tissue and in further discussion with the patient it would appear that she had ulcers in this area in 2009 for a prolonged period of time ultimately requiring  a punch biopsy of this area that only showed venous insufficiency. I did not previously pickup on this part of the history from the patient. 12/18/16; the patient's wound is completely epithelialized. There is no open area here. She has significant bilateral venous insufficiency with secondary lymphedema to a mild-to-moderate degree she does not have compression stockings.. She did not say anything to me when I was in the room, she told our intake nurse that she was still having pain in this area. This isn't unusual recurrent small open area. She is going to go to elastic therapy to obtain compression stockings. 12/25/16; the patient's wound is fully epithelialized. There is no open area here. The patient describes some continued episodic discomfort in this area medial left calf. However everything looks fine and healed here. She is been to elastic therapy and YOANDRA, PIETILA ([id number]) caught herself 15-20 mmHg stockings, they apparently were having trouble getting 20-30 mm stockings in her size 01/22/17; this is a patient we discharged from the clinic a month ago. She has a recurrent open wound on her medial left calf. She had 15 mm support stockings. I told her I thought she needed 20-30 mm compression stockings. She tells me that she has been ill  with hospitalization secondary to asthma and is been found to have severe hypokalemia likely secondary to a combination of Lasix and metolazone. This morning she noted blistering and leaking fluid on the posterior part of her left leg. She called our intake nurse urgently and we was saw her this afternoon. She has not had any real discomfort here. I don't know that she's been wearing any stockings on this leg for at least 2-3 days. ABIs in this clinic were 1.21 on the right and 1.3 on the left. She is previously seen vascular surgery who does not think that there is a peripheral arterial issue. 01/30/17; Patient arrives with no open wound on the left leg. She has been to elastic therapy and obtained 20-69mmhg below knee stockings and she has one on the right leg today. READMISSION 02/19/18; this Molitoris is a now 74 year old patient we've had in this clinic perhaps 3 times before. I had last looked at her from January 07 December 2016 with an area on the medial left leg. We discharged her on 12/25/16 however she had to be readmitted on 01/22/17 with a recurrence. I have in my notes that we discharged her on 20-30 mm stockings although she tells me she was only wearing support hose because she cannot get stockings on predominantly related to her cervical spine surgery/issues. She has had previous ablations done by vein and vascular in [place] including a great saphenous vein ablation on the left with an anterior accessory branch ablation I think both of these were in 2016. On one of the previous visit she had a biopsy noted 2009 that was negative. She is not felt to have an arterial issue. She is not a diabetic. She does have a history of obstructive sleep apnea hypertension asthma as well as chronic venous insufficiency and lymphedema. On this occasion she noted 2 dry scaly patch on her left leg. She tried to put lotion on this it didn't really help. There were 2 open areas.the patient has been seeing  her primary physician from 02/05/18 through 02/14/18. She had Unna boots applied. The superior wound now on the lateral left leg has closed but she's had one wound that remains open on the lateral left leg. This is not the same  spot as we dealt with in 2018. ABIs in this clinic were 1.3 bilaterally 02/26/18; patient has a small wound on the left lateral calf. Dimensions are down. She has chronic venous insufficiency and lymphedema. 03/05/18; small open area on the left lateral calf. Dimensions are down. Tightly adherent necrotic debris over the surface of the wound which was difficult to remove. Also the dressing [over collagen] stuck to the wound surface. This was removed with some difficulty as well. Change the primary dressing to Hydrofera Blue ready 03/12/18; small open area on the left lateral calf. Comes in with tightly adherent surface eschar as well as some adherent Hydrofera Blue. 03/19/18; open area on the left lateral calf. Again adherent surface eschar as well as some adherent Hydrofera Blue nonviable subcutaneous tissue. She complained of pain all week even with the reduction from 4-3 layer compression I put on last week. Also she had an increase in her ankle and calf measurements probably related to the same thing. 03/26/18; open area on the left lateral calf. A very small open area remains here. We used silver alginate starting last week as the Hydrofera Blue seem to stick to the wound bed. In using 4-layer compression 04/02/18; the open area in the left lateral calf at some adherent slough which I removed there is no open area here. We are able to transition her into her own compression stocking. Truthfully I think this is probably his support hose. However this does not maintain skin integrity will be limited. She cannot put over the toe compression stockings on because of neck problems hand problems etc. She is allergic to the lining layer of juxta lites. We might be forced to use  extremitease stocking should this fail READMIT 11/24/2018 Patient is now a 74 year old woman who is not a diabetic. She has been in this clinic on at least 3 previous occasions largely with recurrent wounds on her left leg secondary to chronic venous insufficiency with secondary lymphedema. Her situation is complicated by inability to get stockings on and an allergy to neoprene which is apparently a component and at least juxta lites and other stockings. As a result she really has not been wearing any stockings on her legs. She tells [place] that roughly 2 or 3 weeks ago she started noticing a stinging sensation just above her ankle on the left medial aspect. She has been diagnosed with pseudogout and she wondered whether this was what she was experiencing. She tried to dress this with something she bought at the store however subsequently it pulled skin off and now she has an open wound that is not improving. She has been using Vaseline gauze with a cover bandage. She saw her primary doctor last week who put an Haematologist on her. ABIs in this clinic was 1.03 on the left ZYLAH, TUTT. ([id number]) 2/12; the area is on the left medial ankle. Odd-looking wound with what looks to be surface epithelialization but a multitude of small petechial openings. This clearly not closed yet. We have been using silver alginate under 3 layer compression with TCA 2/19; the wound area did not look quite as good this week. Necrotic debris over the majority of the wound surface which required debridement. She continues to have a multitude of what looked to be small petechial openings. She reminds [place] that she had a biopsy on this initially during her first outbreak in 2015 in [facility]. She expresses concern about this being a possible melanoma. She apparently had a nodular  melanoma up on her shoulder that was treated with excision, lymph node removal and ultimately radiation. I assured her that this does not  look anything like melanoma. Except for the petechial reaction it does look like a venous insufficiency area and she certainly has evidence of this on both sides 2/26; a difficult area on the left medial ankle. The patient clearly has chronic venous hypertension with some degree of lymphedema. The odd thing about the area is the small petechial hemorrhages. I am not really sure how to explain this. This was present last time and this is not a compression injury. We have been using Hydrofera Blue which I changed to last week 3/4; still using Hydrofera Blue. Aggressive debridement today. She does not have known arterial issues. She has seen Dr. Kellie Simmering at [facility] vein and vascular and and has an ablation on the left. [Anterior accessory branch of the greater saphenous]. From what I remember they did not feel she had an arterial issue. The patient has had this area biopsied in 2009 at [facility] dermatology and by her recollection they said this was "stasis". She is also follow-up with dermatology locally who thought that this was more of a vascular issue 3/11; using Hydrofera Blue. Aggressive debridement today. She does not have an arterial issue. We are using 3 layer compression although we may need to go to 4. The patient has been in for multiple changes to her wrap since I last saw her a week ago. She says that the area was leaking. I do not have too much more information on what was found 01/19/19 on evaluation today patient was actually being seen for a nurse visit when unfortunately she had the area on her left lateral lower extremity as well as weeping from the right lower extremity that became apparent. Therefore we did end up actually seeing her for a full visit with myself. She is having some pain at this site as well but fortunately nothing too significant at this point. No fevers, chills, nausea, or vomiting noted at this time. 3/18-Patient is back to the clinic with the left leg venous leg  ulcer, the ulcer is larger in size, has a surface that is densely adherent with fibrinous tissue, the Hydrofera Blue was used but is densely adherent and there was difficulty in removing it. The right lower extremity was also wrapped for weeping edema. Patient has a new area over the left lateral foot above the malleolus that is small and appears to have no debris with intact surrounding skin. Patient is on increased dose of Lasix also as a means to edema management 3/25; the patient has a nonhealing venous ulcer on the medial left leg and last week developed a smaller area on the lateral left calf. We have been using Hydrofera Blue with a contact layer. 4/1; no major change in these wounds areas. Left medial and more recently left lateral calf. I tried Iodoflex last week to aid in debridement she did not tolerate this. She stated her pain was terrible all week. She took the top layer of the 4 layer compression off. 4/8; the patient actually looks somewhat better in terms of her more prominent left lateral calf wound. There is some healthy looking tissue here. She is still complaining of a lot of discomfort. 4/15; patient in a lot of pain secondary to sciatica. She is on a prednisone taper prescribed by her primary physician. She has the 2 areas one on the left medial and more recently a  smaller area on the left lateral calf. Both of these just above the malleoli 4/22; her back pain is better but she still states she is very uncomfortable and now feels she is intolerant to the [employer]. No real change in the wounds we have been using Sorbact. She has been previously intolerant to Iodoflex. There is not a lot of option about what we can use to debride this wound under compression that she no doubt needs. sHe states Ultram no longer works for her pain 4/29; no major change in the wounds slightly increased depth. Surface on the original medial wound perhaps somewhat improved however the more recent  area on the lateral left ankle is 100% covered in very adherent debris we have been using Sorbact. She tolerates 4 layer compression well and her edema control is a lot better. She has not had to come in for a nurse check 5/6; no major change in the condition of the wounds. She did consent to debridement today which was done with some difficulty. Continuing Sorbact. She did not tolerate Iodoflex. She was in for a check of her compression the day after we wrapped her last week this was adjusted but nothing much was found 5/13; no major change in the condition or area of the wounds. I was able to get a fairly aggressive debridement done on the lateral left leg wound. Even using Sorbact under compression. She came back in on Friday to have the wrap changed. She says she felt uncomfortable on the lateral aspect of her ankle. She has a long history of chronic venous insufficiency including previous ablation surgery on this side. 5/20-Patient returns for wounds on left leg with both wounds covered in slough, with the lateral leg wound larger in size, she has been in 3 layer compression and felt more comfortable, she describes pain in ankle, in leg and pins and needles in foot, and is about to try Pamelor for this 6/3; wounds on the left lateral and left medial leg. The area medially which is the most recent of the 2 seems to have had the largest increase in dimensions. We have been using Sorbac to try and debride the surface. She has been to see orthopedics AERICA, DONICA ([id number]) they apparently did a plain x-ray that was indeterminant. Diagnosed her with neuropathy and they have ordered an MRI to determine if there is underlying osteomyelitis. This was not high on my thought list but I suppose it is prudent. We have advised her to make an appointment with vein and vascular in [place]. She has a history of a left greater saphenous and accessory vein ablations I wonder if there is anything else  that can be done from a surgical point of view to help in these difficult refractory wounds. We have previously healed this wound on one occasion but it keeps on reopening [medial side] 6/10; deep tissue culture I did last week I think on the left medial wound showed both moderate E. coli and moderate staph aureus [MSSA]. She is going to require antibiotics and I have chosen Augmentin. We have been using Sorbact and we have made better looking wound surface on both sides but certainly no improvement in wound area. She was back in last Friday apparently for a dressing changes the wrap was hurting her outer left ankle. She has not managed to get a hold of vein and vascular in [place]. We are going to have to make her that appointment 6/17; patient is tolerating the  Augmentin. She had an MRI that I think was ordered by orthopedic surgeon this did not show osteomyelitis or an abscess did suggest cellulitis. We have been using Sorbact to the lateral and medial ankles. We have been trying to arrange a follow-up appointment with vein and vascular in [place] or did her original ablations. We apparently an area sent the request to vein and vascular in [facility] 6/24; patient has completed the Augmentin. We do not yet have a vein and vascular appointment in [place]. I am not sure what the issue is here we have asked her to call tomorrow. We are using Sorbact. Making some improvements and especially the medial wound. Both surfaces however look better medial and lateral. 7/1; the patient has been in contact with vein and vascular in [place] but has not yet received an appointment. Using Sorbact we have gradually improve the wound surface with no improvement in surface area. She is approved for Apligraf but the wound surface still is not completely viable. She has not had to come in for a dressing change 7/8; the patient has an appointment with vein and vascular on 7/31 which is a Friday afternoon.  She is concerned about getting back here for [place] to dress her wounds. I think it is important to have them goal for her venous reflux/history of ablations etc. to see if anything else can be done. She apparently tested positive for 1 of the blood tests with regards to lupus and saw a rheumatologist. He has raised the issue of vasculitis again. I have had this thought in the past however the evidence seems overwhelming that this is a venous reflux etiology. If the rheumatologist tells me there is clinical and laboratory investigation is positive for lupus I will rethink this. 7/15; the patient's wound surfaces are quite a bit better. The medial area which was her original wound now has no depth although the lateral wound which was the more recent area actually appears larger. Both with viable surfaces which is indeed better. Using Sorbact. I wanted to use Apligraf on her however there is the issue of the vein and vascular appointment on 7/31 at 2:00 in the afternoon which would not allow her to get back to be rewrapped and they would no doubt remove the graft 7/22; the patient's wound surfaces have moderate amount of debris although generally look better. The lateral one is larger with 2 small satellite areas superiorly. We are waiting for her vein and vascular appointment on 7/31. She has been approved for Apligraf which I would like to use after th 7/29; wound surfaces have improved no debridement is required we have been using Sorbact. She sees vein and vascular on Friday with this so question of whether anything can be done to lessen the likelihood of recurrence and/or speed the healing of these areas. She is already had previous ablations. She no doubt has severe venous hypertension 8/5-Patient returns at 1 week, she was in [place] for 3 days by her podiatrist, we have been using so backed to the wound, she has increased pain in both the wounds on the left lower leg especially the more distal  one on the lateral aspect 8/12-Patient returns at 1 week and she is agreeable to having debridement in both wounds on her left leg today. We have been using Sorbact, and vascular studies were reviewed at last visit 8/19; the patient arrives with her wounds fairly clean and no debridement is required. We have used Sorbact which is really  done a nice job in cleaning up these very difficult wound surfaces. The patient saw Dr. Donzetta Matters of vascular surgery on 7/31. He did not feel that there was an arterial component. He felt that her treated greater saphenous vein is adequately addressed and that the small saphenous vein did not appear to be involved significantly. She was also noted to have deep venous reflux which is not treatable. Dr. Donzetta Matters mentioned the possibility of a central obstructive component leading to reflux and he offered her central venography. She wanted to discuss this or think about it. I have urged her to go ahead with this. She has had recurrent difficult wounds in these areas which do heal but after months in the clinic. If there is anything that can be done to reduce the likelihood of this I think it is worth it. 9/2 she is still working towards getting follow-up with Dr. Donzetta Matters to schedule her CT. Things are quite a bit worse venography. I put Apligraf on 2 weeks ago on both wounds on the medial and lateral part of her left lower leg. She arrives in clinic today with 3 superficial additional wounds above the area laterally and one below the wound medially. She describes a lot of discomfort. I think these are probably wrapped injuries. Does not look like she has cellulitis. 07/20/2019 on evaluation today patient appears to be doing somewhat poorly in regard to her lower extremity ulcers. She in fact showed signs of erythema in fact we may even be dealing with an infection at this time. Unfortunately I am unsure if this is just infection or if indeed there may be some allergic reaction that  occurred as a result of the Apligraf application. With that being said that would be unusual but nonetheless not impossible in this patient is one who is unfortunately allergic to quite a bit. Currently we have been using the Sorbact which seems to do as well as anything for her. I do think we may want to obtain ISAI, ATANACIO ([id number]) a culture today to see if there is anything showing up there that may need to be addressed. 9/16; noted that last week the wounds look worse in 1 week follow-up of the Apligraf. Using Sorbact as of 2 days ago. She arrives with copious amounts of drainage and new skin breakdown on the back of the left calf. The wounds arm more substantial bilaterally. There is a fair amount of swelling in the left calf no overt DVT there is edema present I think in the left greater than right thigh. She is supposed to go on 9/28 for CT venography. The wounds on the medial and lateral calf are worse and she has new skin breakdown posteriorly at least new for me. This is almost developing into a circumferential wound area The Apligraf was taken off last week which I agree with things are not going in the right direction a culture was done we do not have that back yet. She is on Augmentin that she started 2 days ago 9/23; dressing was changed by her nurses on Monday. In general there is no improvement in the wound areas although the area looks less angry than last week. She did get Augmentin for MSSA cultured on the 14th. She still appears to have too much swelling in the left leg even with 3 layer compression 9/30; the patient underwent her procedure on 9/28 by Dr. Donzetta Matters at vascular and vein specialist. She was discovered to have the common iliac vein  measuring 12.2 mm but at the level of L4-L5 measured 3 mm. After stenting it measured 10 mm. 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 Rouleaux flow in the common femoral and femoral vein was observed much improved  after stenting. We are using silver alginate to the wounds on the medial and lateral ankle on the left. 4 layer compression 10/7; the patient had fluid swelling around her knee and 4 layer compression. At the advice of vein and vascular this was reduced to 3 layer which she is tolerating better. We have been using silver alginate under 3 layer compression since last Friday 10/14; arrives with the areas on the left ankle looking a lot better. Inflammation in the area also a lot better. She came in for a nurse check on 10/9 10/21; continued nice improvement. Slight improvements in surface area of both the medial and lateral wounds on the left. A lot of the satellite lesions in the weeping erythema around these from stasis dermatitis is resolved. We have been using silver alginate 10/28; general improvement in the entire wound areas although not a lot of change in dimensions the wound certainly looks better. There is a lot less in terms of venous inflammation. Continue silver alginate this week however look towards Hydrofera Blue next week 11/4; very adherent debris on the medial wound left wound is not as bad. We have been using silver alginate. Change to [facility] today 11/11; very adherent debris on both wound areas. She went to vein and vascular last week and follow-up they put in [place] boot on this today. He says the [facility] was adherent. Wound is definitely not as good as last week. Especially on the left there the satellite lesions look more prominent 11/18; absolutely no better. erythema on lateral aspect with tenderness. 09/30/2019 on evaluation today patient appears to actually be doing better. Dr. Dellia Nims did put her on doxycycline last week which I do believe has helped her at this point. Fortunately there is no signs of active infection at this time. No fevers, chills, nausea, vomiting, or diarrhea. I do believe he may want extend the doxycycline for 7 additional days just to  ensure everything does completely cleared up the patient is in agreement with that plan. Otherwise she is going require some sharp debridement today 12/2; patient is completing a 2-week course of doxycycline. I gave her this empirically for inflammation as well as infection when I last saw her 2 weeks ago. All of this seems to be better. She is using silver alginate she has the area on the medial aspect of the larger area laterally and the 2 small satellite regions laterally above the major wound. 12/9; the patient's wound on the left medial and left lateral calf look really quite good. We have been using silver alginate. She saw vein and vascular in follow-up on 10/09/2019. She has had a previous left greater saphenous vein ablation by Dr. Oscar La in 2016. More recently she underwent a left common iliac vein stent by Dr. 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 The swelling is improved and certainly the wounds have improved. The patient shows [place] today area on the right medial calf there is almost no wound but leaking lymphedema. She says she start this started 3 or 4 days ago. She did not traumatize it. It is not painful. She does not wear compression on that side 12/16; the patient continues to do well laterally. Medially still requiring debridement. The area  on the right calf did not materialize to anything and is not currently open. We wrapped this last time. She has support stockings for that leg although I am not sure they are going to provide adequate compression 12/23; the lateral wound looks stable. Medially still requiring debridement for tightly adherent fibrinous debris. We've been using silver alginate. Surface area not any different 12/30; neither wound is any better with regards to surface and the area on the left lateral is larger. I been using silver alginate to the left lateral which look quite good last week and Sorbact to the left medial TANNA, BARTEK  ([id number]) 11/11/2019. Lateral wound area actually looks better and somewhat smaller. Medial still requires a very aggressive debridement today. We have been using Sorbact on both wound areas 1/13; not much better still adherent debris bilaterally. I been using Sorbact. She has severe venous hypertension. Probably some degree of dermal fibrosis distally. I wonder whether tighter compression might help and I am going to try that today. We also need to work on the bioburden Objective Constitutional Sitting or standing Blood Pressure is within target range for patient.. Pulse regular and within target range for patient.Marland Kitchen Respirations regular, non-labored and within target range.Marland Kitchen appears in no distress. Vitals Time Taken: 12:45 PM, Height: 63 in, Weight: 224.7 lbs, BMI: 39.8, Temperature: 98.4 F, Pulse: 72 bpm, Respiratory Rate: 16 breaths/min, Blood Pressure: 128/57 mmHg. Respiratory Respiratory effort is easy and symmetric bilaterally. Rate is normal at rest and on room air.. Cardiovascular Pedal pulses palpable. He has nonpitting edema bilaterally in the mid to upper calf and tightly fibrotic distal skin where her wounds are on the lower calf and ankle. Severe venous hypertension. Psychiatric No evidence of depression, anxiety, or agitation. Calm, cooperative, and communicative. Appropriate interactions and affect.. General Notes: Wound exam Both areas on the lateral and medial ankle are about the same condition although the left is somewhat better. There is still tightly adherent fibrinous debris. She tolerates chemical debridement very poorly. This will not wash off Integumentary (Hair, Skin) There is no erythema around the wound. Wound #5 status is Open. Original cause of wound was Gradually Appeared. The wound is located on the Left,Medial Lower Leg. The wound measures 2.7cm length x 2.7cm width x 0.2cm depth; 5.726cm^2 area and 1.145cm^3 volume. There is Fat Layer (Subcutaneous  Tissue) Exposed exposed. There is a large amount of serosanguineous drainage noted. The wound margin is flat and intact. There is medium (34-66%) pale granulation within the wound bed. There is a medium (34-66%) amount of necrotic tissue within the wound bed including Adherent Slough. Wound #6 status is Open. Original cause of wound was Gradually Appeared. The wound is located on the Left,Lateral Lower Leg. The wound measures 4cm length x 3cm width x 0.3cm depth; 9.425cm^2 area and 2.827cm^3 volume. There is Fat Layer (Subcutaneous Tissue) Exposed exposed. There is no tunneling or undermining noted. There is a large amount of serosanguineous drainage noted. The wound margin is flat and intact. There is medium (34-66%) pink granulation within the wound bed. There is a medium (34-66%) amount of necrotic tissue within the wound bed including Adherent Slough. Wound #9 status is Open. Original cause of wound was Gradually Appeared. The wound is located on the Left,Lateral,Posterior Lower Leg. The wound measures 0.9cm length x 0.4cm width x 0.2cm depth; 0.283cm^2 area and 0.057cm^3 volume. There is Fat Layer (Subcutaneous Tissue) Exposed exposed. There is no tunneling or undermining noted. There is a large amount of serosanguineous drainage  noted. The wound margin is flat and intact. There is large (67- 100%) pink granulation within the wound bed. There is a small (1-33%) amount of necrotic tissue within the wound bed ALANEY, FIEGL. ([id number]) including Adherent Slough. Assessment Active Problems ICD-10 Non-pressure chronic ulcer of left calf limited to breakdown of skin Chronic venous hypertension (idiopathic) with inflammation of right lower extremity Lymphedema, not elsewhere classified Non-pressure chronic ulcer of other part of right lower leg with other specified severity Plan Wound Cleansing: Wound #5 Left,Medial Lower Leg: Cleanse wound with mild soap and water Wound #6 Left,Lateral  Lower Leg: Cleanse wound with mild soap and water Wound #9 Left,Lateral,Posterior Lower Leg: Cleanse wound with mild soap and water Anesthetic (add to Medication List): Wound #5 Left,Medial Lower Leg: Topical Lidocaine 4% cream applied to wound bed prior to debridement (In Clinic Only). Wound #6 Left,Lateral Lower Leg: Topical Lidocaine 4% cream applied to wound bed prior to debridement (In Clinic Only). Wound #9 Left,Lateral,Posterior Lower Leg: Topical Lidocaine 4% cream applied to wound bed prior to debridement (In Clinic Only). Skin Barriers/Peri-Wound Care: Wound #5 Left,Medial Lower Leg: Other: - Gentamycin Wound #6 Left,Lateral Lower Leg: Other: - Gentamycin Wound #9 Left,Lateral,Posterior Lower Leg: Other: - Gentamycin Primary Wound Dressing: Wound #5 Left,Medial Lower Leg: Other: - sorbact Wound #6 Left,Lateral Lower Leg: Other: - sorbact Wound #9 Left,Lateral,Posterior Lower Leg: Other: - sorbact Secondary Dressing: Wound #5 Left,Medial Lower Leg: Drawtex Wound #6 Left,Lateral Lower Leg: Drawtex Wound #9 Left,Lateral,Posterior Lower Leg: Drawtex Dressing Change FrequencySECILIA, MURRILL ([id number]) Wound #5 Left,Medial Lower Leg: Change dressing every week Other: - as needed. Wound #6 Left,Lateral Lower Leg: Change dressing every week Other: - as needed. Wound #9 Left,Lateral,Posterior Lower Leg: Change dressing every week Other: - as needed. Follow-up Appointments: Wound #5 Left,Medial Lower Leg: Return Appointment in 1 week. Nurse Visit as needed - Call of needed Wound #6 Left,Lateral Lower Leg: Return Appointment in 1 week. Nurse Visit as needed - Call of needed Wound #9 Left,Lateral,Posterior Lower Leg: Return Appointment in 1 week. Nurse Visit as needed - Call of needed Edema Control: Wound #5 Left,Medial Lower Leg: 4-Layer Compression System - Left Lower Extremity. Wound #6 Left,Lateral Lower Leg: 4-Layer Compression System - Left Lower  Extremity. Wound #9 Left,Lateral,Posterior Lower Leg: 4-Layer Compression System - Left Lower Extremity. #1 I am going to use topical gentamicin suggested by our case manager. Again this is to try and get out the bioburden here. Continue Sorbact. Continue compression 2. The patient has mae thurner physiology and she has been stented we had good improvement for a while after that but it is stalled. 3. I think she has lymphedema with fibrosis of the skin distally i.e. inverted bottle sign I am going to increase to 4 layer compression to see if we can control the forces around the wound bed Electronic Signature(s) Signed: 11/18/2019 4:15:01 PM By: Linton Ham MD Entered By: Linton Ham on 11/18/2019 13:21:23 BRAYDEN, KIRKBY ([id number]) -------------------------------------------------------------------------------- SuperBill Details Patient Name: LANAIYAH, MUSGRAVES. Date of Service: 11/18/2019 Medical Record Number: [id number] Patient Account Number: [id number] Date of Birth/Sex: August 30, 1946 (74 y.o. F) Treating RN: Cornell Barman Primary Care Provider: [name] Other Clinician: Referring Provider: [name] Treating Provider/Extender: Tito Dine in Treatment: 49 Diagnosis Coding ICD-10 Codes Code Description [phone] Non-pressure chronic ulcer of left calf limited to breakdown of skin I87.321 Chronic venous hypertension (idiopathic) with inflammation of right lower extremity I89.0 Lymphedema, not elsewhere classified L97.818 Non-pressure chronic ulcer of  other part of right lower leg with other specified severity Facility Procedures CPT4 Code: [id number] Description: (Facility Use Only) [id number] - [place] LWR LT LEG Modifier: Quantity: 1 Physician Procedures CPT4 Code Description: [id number] Friona - WC PHYS LEVEL 3 - EST PT ICD-10 Diagnosis Description L97.221 Non-pressure chronic ulcer of left calf limited to breakdown of I89.0 Lymphedema, not  elsewhere classified I87.321 Chronic venous hypertension  (idiopathic) with inflammation of r Modifier: skin ight lower extr Quantity: 1 emity Electronic Signature(s) Signed: 11/18/2019 4:15:01 PM By: Linton Ham MD Entered By: Linton Ham on 11/18/2019 13:21:52

## 2019-11-18 NOTE — Progress Notes (Signed)
Amber, [name] ([id number]) Visit Report for 11/18/2019 Arrival Information Details Patient Name: [name], [name]. Date of Service: 11/18/2019 12:45 PM Medical Record Number: [id number] Patient Account Number: [id number] Date of Birth/Sex: [name] 28, 1946 (74 y.o. F) Treating RN: Cornell Barman Primary Care Darrel Gloss: [name] Other Clinician: Referring Kurt Azimi: [name] Treating Averi Cacioppo/Extender: Tito Dine in Treatment: 36 Visit Information History Since Last Visit Added or deleted any medications: No Patient Arrived: Ambulatory Any new allergies or adverse reactions: No Arrival Time: 12:45 Had a fall or experienced change in No Accompanied By: self activities of daily living that may affect Transfer Assistance: None risk of falls: Patient Identification Verified: Yes Signs or symptoms of abuse/neglect since last visito No Secondary Verification Process Yes Hospitalized since last visit: No Completed: Implantable device outside of the clinic excluding No Patient Requires Transmission-Based No cellular tissue based products placed in the center Precautions: since last visit: Patient Has Alerts: Yes Has Dressing in Place as Prescribed: Yes Patient Alerts: Patient on Blood Has Compression in Place as Prescribed: Yes Thinner Pain Present Now: No aspirin 81 Electronic Signature(s) Signed: 11/18/2019 4:11:36 PM By: Lorine Bears RCP, RRT, CHT Entered By: Lorine Bears on 11/18/2019 Mineral, [name] -------------------------------------------------------------------------------- Encounter Discharge Information Details Patient Name: Amber, [name]. Date of Service: 11/18/2019 12:45 PM Medical Record Number: [id number] Patient Account Number: [id number] Date of Birth/Sex: 1946-10-05 (74 y.o. F) Treating RN: Cornell Barman Primary Care Bradan Congrove: [name] Other Clinician: Referring Yuvia Plant: [name] Treating Maiana Hennigan/Extender: Tito Dine in Treatment: 6 Encounter Discharge Information Items Discharge Condition: Stable Ambulatory Status: Ambulatory Discharge Destination: Home Transportation: Private Auto Accompanied By: self Schedule Follow-up Appointment: Yes Clinical Summary of Care: Electronic Signature(s) Signed: 11/18/2019 4:52:26 PM By: [name], [name], RN, CWS, [name], [name] Entered By: [name], [name], RN, CWS, Kim on 11/18/2019 13:07:41 [name] -------------------------------------------------------------------------------- Lower Extremity Assessment Details Patient Name: Amber, [name]. Date of Service: 11/18/2019 12:45 PM Medical Record Number: [id number] Patient Account Number: [id number] Date of Birth/Sex: 1946-10-01 (74 y.o. F) Treating RN: Army Melia Primary Care Torrance Frech: [name] Other Clinician: Referring Jaray Boliver: [name] Treating Phallon Haydu/Extender: Tito Dine in Treatment: 49 Edema Assessment Assessed: [Left: No] [Right: No] Edema: [Left: N] [Right: o] Calf Left: Right: Point of Measurement: 33 cm From Medial Instep 42 cm cm Ankle Left: Right: Point of Measurement: 10 cm From Medial Instep 23 cm cm Vascular Assessment Pulses: Dorsalis Pedis Palpable: [Left:Yes] Electronic Signature(s) Signed: 11/18/2019 3:13:50 PM By: Army Melia Entered By: Army Melia on 11/18/2019 12:54:36 [name], [name] -------------------------------------------------------------------------------- Multi Wound Chart Details Patient Name: Amber, [name]. Date of Service: 11/18/2019 12:45 PM Medical Record Number: [id number] Patient Account Number: [id number] Date of Birth/Sex: 16-Sep-1946 (74 y.o. F) Treating RN: Cornell Barman Primary Care Bee Marchiano: [name] Other Clinician: Referring Aniket Paye: [name] Treating Maryjayne Kleven/Extender: Tito Dine in Treatment: 62 Vital  Signs Height(in): 63 Pulse(bpm): 72 Weight(lbs): 224.7 Blood Pressure(mmHg): 128/57 Body Mass Index(BMI): 40 Temperature(F): 98.4 Respiratory Rate 16 (breaths/min): Photos: Wound Location: Left Lower Leg - Medial Left Lower Leg - Lateral Left Lower Leg - Lateral, Posterior Wounding Event: Gradually Appeared Gradually Appeared Gradually Appeared Primary Etiology: Lymphedema Venous Leg Ulcer Venous Leg Ulcer Comorbid History: Cataracts, Asthma, Sleep Cataracts, Asthma, Sleep Cataracts, Asthma, Sleep Apnea, Deep Vein Apnea, Deep Vein Apnea, Deep Vein Thrombosis, Hypertension, Thrombosis, Hypertension, Thrombosis, Hypertension, Peripheral Venous Disease, Peripheral Venous Disease, Peripheral Venous Disease, Osteoarthritis, Received Osteoarthritis, Received Osteoarthritis, Received Chemotherapy,  Received Chemotherapy, Received Chemotherapy, Received Radiation Radiation Radiation Date Acquired: 11/19/2018 01/19/2019 07/08/2019 Weeks of Treatment: 49 43 19 Wound Status: Open Open Open Measurements L x W x D 2.7x2.7x0.2 4x3x0.3 0.9x0.4x0.2 (cm) Area (cm) : 5.726 9.425 0.283 Volume (cm) : 1.145 2.827 0.057 % Reduction in Area: 33.00% -4184.10% 49.90% % Reduction in Volume: -33.90% -12750.00% 0.00% Classification: Full Thickness Without Full Thickness Without Full Thickness Without Exposed Support Structures Exposed Support Structures Exposed Support Structures Exudate Amount: Large Large Large Exudate Type: Serosanguineous Serosanguineous Serosanguineous Exudate Color: red, brown red, brown red, brown Wound Margin: Flat and Intact Flat and Intact Flat and Intact Granulation Amount: Medium (34-66%) Medium (34-66%) Large (67-100%) Granulation Quality: Pale Pink Pink Necrotic Amount: Medium (34-66%) Medium (34-66%) Small (1-33%) Schliep, Yarelie J. ([id number]) Exposed Structures: Fat Layer (Subcutaneous Fat Layer (Subcutaneous Fat Layer (Subcutaneous Tissue) Exposed: Yes Tissue) Exposed:  Yes Tissue) Exposed: Yes Fascia: No Fascia: No Fascia: No Tendon: No Tendon: No Tendon: No Muscle: No Muscle: No Muscle: No Joint: No Joint: No Joint: No Bone: No Bone: No Bone: No Epithelialization: None None None Treatment Notes Wound #5 (Left, Medial Lower Leg) Notes Gentamycin, Sorbact to all wounds, drawtex, 3-Layer left, unna to anchor. TCA peri Wound #6 (Left, Lateral Lower Leg) Notes Gentamycin, Sorbact to all wounds, drawtex, 3-Layer left, unna to anchor. TCA peri Wound #9 (Left, Lateral, Posterior Lower Leg) Notes Gentamycin, Sorbact to all wounds, drawtex, 3-Layer left, unna to anchor. TCA peri Electronic Signature(s) Signed: 11/18/2019 4:15:01 PM By: Linton Ham MD Entered By: Linton Ham on 11/18/2019 13:08:47 [name] ([id number]) -------------------------------------------------------------------------------- Multi-Disciplinary Care Plan Details Patient Name: YUDELKA, LEH. Date of Service: 11/18/2019 12:45 PM Medical Record Number: [id number] Patient Account Number: [id number] Date of Birth/Sex: 09/11/1946 (74 y.o. F) Treating RN: Cornell Barman Primary Care Leshae Mcclay: [name] Other Clinician: Referring Michie Molnar: [name] Treating Mohannad Olivero/Extender: Tito Dine in Treatment: 79 Active Inactive Orientation to the Wound Care Program Nursing Diagnoses: Knowledge deficit related to the wound healing center program Goals: Patient/caregiver will verbalize understanding of the Ward Program Date Initiated: 12/10/2018 Target Resolution Date: 01/09/2019 Goal Status: Active Interventions: Provide education on orientation to the wound center Notes: Soft Tissue Infection Nursing Diagnoses: Impaired tissue integrity Goals: Patient's soft tissue infection will resolve Date Initiated: 12/10/2018 Target Resolution Date: 01/09/2019 Goal Status: Active Interventions: Assess signs and symptoms of infection every  visit Notes: Venous Leg Ulcer Nursing Diagnoses: Actual venous Insuffiency (use after diagnosis is confirmed) Goals: Patient will maintain optimal edema control Date Initiated: 12/10/2018 Target Resolution Date: 01/09/2019 Goal Status: Active Interventions: Assess peripheral edema status every visit. DELEENA, OCONNOR ([id number]) Treatment Activities: Therapeutic compression applied : 12/10/2018 Notes: Wound/Skin Impairment Nursing Diagnoses: Impaired tissue integrity Goals: Patient/caregiver will verbalize understanding of skin care regimen Date Initiated: 12/10/2018 Target Resolution Date: 01/09/2019 Goal Status: Active Interventions: Assess ulceration(s) every visit Treatment Activities: Topical wound management initiated : 12/10/2018 Notes: Electronic Signature(s) Signed: 11/18/2019 4:52:26 PM By: [name], [name], RN, CWS, [name], [name] Entered By: [name], [name], RN, CWS, Kim on 11/18/2019 13:00:18 BLAKELEE, CERTO ([id number]) -------------------------------------------------------------------------------- Pain Assessment Details Patient Name: TORRYN, HUNTING. Date of Service: 11/18/2019 12:45 PM Medical Record Number: [id number] Patient Account Number: [id number] Date of Birth/Sex: Nov 09, 1945 (74 y.o. F) Treating RN: Cornell Barman Primary Care Kadra Kohan: [name] Other Clinician: Referring Bohdan Macho: [name] Treating Aquanetta Schwarz/Extender: Tito Dine in Treatment: 62 Active Problems Location of Pain Severity and Description of Pain Patient Has Paino No Site Locations  Pain Management and Medication Current Pain Management: Electronic Signature(s) Signed: 11/18/2019 4:11:36 PM By: Lorine Bears RCP, RRT, CHT Signed: 11/18/2019 4:52:26 PM By: [name], [name], RN, CWS, [name], [name] Entered By: Lorine Bears on 11/18/2019 12:46:47 [name]  ([id number]) -------------------------------------------------------------------------------- Patient/Caregiver Education Details Patient Name: ELVEDA, WIDEMAN. Date of Service: 11/18/2019 12:45 PM Medical Record Number: [id number] Patient Account Number: [id number] Date of Birth/Gender: [birth year] (74 y.o. F) Treating RN: Cornell Barman Primary Care Physician: [name] Other Clinician: Referring Physician: [name] Treating Physician/Extender: Tito Dine in Treatment: 57 Education Assessment Education Provided To: Patient Education Topics Provided Venous: Handouts: Controlling Swelling with Multilayered Compression Wraps Methods: Demonstration, Explain/Verbal Responses: State content correctly Wound/Skin Impairment: Handouts: Caring for Your Ulcer Methods: Demonstration, Explain/Verbal Responses: State content correctly Electronic Signature(s) Signed: 11/18/2019 4:52:26 PM By: [name], [name], RN, CWS, [name], [name] Entered By: [name], [name], RN, CWS, Kim on 11/18/2019 13:06:37 [name] ([id number]) -------------------------------------------------------------------------------- Wound Assessment Details Patient Name: WENDA, ENRIQUES. Date of Service: 11/18/2019 12:45 PM Medical Record Number: [id number] Patient Account Number: [id number] Date of Birth/Sex: 11-03-1946 (74 y.o. F) Treating RN: Army Melia Primary Care Corayma Cashatt: [name] Other Clinician: Referring Lindie Roberson: [name] Treating Gwen Edler/Extender: Tito Dine in Treatment: 49 Wound Status Wound Number: 5 Primary Lymphedema Etiology: Wound Location: Left Lower Leg - Medial Wound Open Wounding Event: Gradually Appeared Status: Date Acquired: 11/19/2018 Comorbid Cataracts, Asthma, Sleep Apnea, Deep Vein Weeks Of Treatment: 49 History: Thrombosis, Hypertension, Peripheral Venous Clustered Wound: No Disease, Osteoarthritis, Received Chemotherapy, Received  Radiation Photos Wound Measurements Length: (cm) 2.7 Width: (cm) 2.7 Depth: (cm) 0.2 Area: (cm) 5.726 Volume: (cm) 1.145 % Reduction in Area: 33% % Reduction in Volume: -33.9% Epithelialization: None Wound Description Full Thickness Without Exposed Support Classification: Structures Wound Margin: Flat and Intact Exudate Large Amount: Exudate Type: Serosanguineous Exudate Color: red, brown Foul Odor After Cleansing: No Slough/Fibrino Yes Wound Bed Granulation Amount: Medium (34-66%) Exposed Structure Granulation Quality: Pale Fascia Exposed: No Necrotic Amount: Medium (34-66%) Fat Layer (Subcutaneous Tissue) Exposed: Yes Necrotic Quality: Adherent Slough Tendon Exposed: No Muscle Exposed: No Joint Exposed: No Bone Exposed: No Nation, Moriya J. ([id number]) Treatment Notes Wound #5 (Left, Medial Lower Leg) Notes Gentamycin, Sorbact to all wounds, drawtex, 3-Layer left, unna to anchor. TCA peri Electronic Signature(s) Signed: 11/18/2019 3:13:50 PM By: Army Melia Entered By: Army Melia on 11/18/2019 12:51:56 Haggar, [name] ([id number]) -------------------------------------------------------------------------------- Wound Assessment Details Patient Name: OLETA, AMSLER. Date of Service: 11/18/2019 12:45 PM Medical Record Number: [id number] Patient Account Number: [id number] Date of Birth/Sex: [birth year] (74 y.o. F) Treating RN: Army Melia Primary Care Honesty Menta: [name] Other Clinician: Referring Uriel Horkey: [name] Treating Arayla Kruschke/Extender: Tito Dine in Treatment: 49 Wound Status Wound Number: 6 Primary Venous Leg Ulcer Etiology: Wound Location: Left Lower Leg - Lateral Wound Open Wounding Event: Gradually Appeared Status: Date Acquired: 01/19/2019 Comorbid Cataracts, Asthma, Sleep Apnea, Deep Vein Weeks Of Treatment: 43 History: Thrombosis, Hypertension, Peripheral Venous Clustered Wound: No Disease, Osteoarthritis,  Received Chemotherapy, Received Radiation Photos Wound Measurements Length: (cm) 4 Width: (cm) 3 Depth: (cm) 0.3 Area: (cm) 9.425 Volume: (cm) 2.827 % Reduction in Area: -4184.1% % Reduction in Volume: -12750% Epithelialization: None Tunneling: No Undermining: No Wound Description Full Thickness Without Exposed Support Classification: Structures Wound Margin: Flat and Intact Exudate Large Amount: Exudate Type: Serosanguineous Exudate Color: red, brown Foul Odor After Cleansing: No Slough/Fibrino Yes Wound Bed Granulation Amount: Medium (34-66%) Exposed Structure Granulation Quality: Pink  Fascia Exposed: No Necrotic Amount: Medium (34-66%) Fat Layer (Subcutaneous Tissue) Exposed: Yes Necrotic Quality: Adherent Slough Tendon Exposed: No Muscle Exposed: No Joint Exposed: No Bone Exposed: No Farve, Sarajane J. ([id number]) Treatment Notes Wound #6 (Left, Lateral Lower Leg) Notes Gentamycin, Sorbact to all wounds, drawtex, 3-Layer left, unna to anchor. TCA peri Electronic Signature(s) Signed: 11/18/2019 3:13:50 PM By: Army Melia Entered By: Army Melia on 11/18/2019 12:52:43 FLORESTINE, GARRY ([id number]) -------------------------------------------------------------------------------- Wound Assessment Details Patient Name: KYLE, LAACK. Date of Service: 11/18/2019 12:45 PM Medical Record Number: [id number] Patient Account Number: [id number] Date of Birth/Sex: May 12, 1946 (74 y.o. F) Treating RN: Army Melia Primary Care Anida Deol: [name] Other Clinician: Referring Yatzari Jonsson: [name] Treating Glade Strausser/Extender: Tito Dine in Treatment: 49 Wound Status Wound Number: 9 Primary Venous Leg Ulcer Etiology: Wound Location: Left Lower Leg - Lateral, Posterior Wound Open Wounding Event: Gradually Appeared Status: Date Acquired: 07/08/2019 Comorbid Cataracts, Asthma, Sleep Apnea, Deep Vein Weeks Of Treatment: 19 History: Thrombosis,  Hypertension, Peripheral Venous Clustered Wound: No Disease, Osteoarthritis, Received Chemotherapy, Received Radiation Photos Wound Measurements Length: (cm) 0.9 Width: (cm) 0.4 Depth: (cm) 0.2 Area: (cm) 0.283 Volume: (cm) 0.057 % Reduction in Area: 49.9% % Reduction in Volume: 0% Epithelialization: None Tunneling: No Undermining: No Wound Description Full Thickness Without Exposed Support Classification: Structures Wound Margin: Flat and Intact Exudate Large Amount: Exudate Type: Serosanguineous Exudate Color: red, brown Foul Odor After Cleansing: No Slough/Fibrino Yes Wound Bed Granulation Amount: Large (67-100%) Exposed Structure Granulation Quality: Pink Fascia Exposed: No Necrotic Amount: Small (1-33%) Fat Layer (Subcutaneous Tissue) Exposed: Yes Necrotic Quality: Adherent Slough Tendon Exposed: No Muscle Exposed: No Joint Exposed: No Bone Exposed: No Clemons, Lacee J. ([id number]) Treatment Notes Wound #9 (Left, Lateral, Posterior Lower Leg) Notes Gentamycin, Sorbact to all wounds, drawtex, 3-Layer left, unna to anchor. TCA peri Electronic Signature(s) Signed: 11/18/2019 3:13:50 PM By: Army Melia Entered By: Army Melia on 11/18/2019 12:53:32 Monteforte, [name] -------------------------------------------------------------------------------- Vitals Details Patient Name: NECIE, GRIDLEY. Date of Service: 11/18/2019 12:45 PM Medical Record Number: [id number] Patient Account Number: [id number] Date of Birth/Sex: [birth year] (74 y.o. F) Treating RN: Cornell Barman Primary Care Truth Wolaver: [name] Other Clinician: Referring Shrey Boike: [name] Treating Li Fragoso/Extender: Tito Dine in Treatment: 49 Vital Signs Time Taken: 12:45 Temperature (F): 98.4 Height (in): 63 Pulse (bpm): 72 Weight (lbs): 224.7 Respiratory Rate (breaths/min): 16 Body Mass Index (BMI): 39.8 Blood Pressure (mmHg): 128/57 Reference Range: 80 -  120 mg / dl Electronic Signature(s) Signed: 11/18/2019 4:11:36 PM By: Lorine Bears RCP, RRT, CHT Entered By: Lorine Bears on 11/18/2019 12:47:58

## 2019-11-20 ENCOUNTER — Other Ambulatory Visit: Payer: Self-pay | Admitting: Family Medicine

## 2019-11-22 ENCOUNTER — Other Ambulatory Visit: Payer: Self-pay | Admitting: Family Medicine

## 2019-11-22 ENCOUNTER — Other Ambulatory Visit: Payer: Self-pay | Admitting: Vascular Surgery

## 2019-11-25 ENCOUNTER — Other Ambulatory Visit: Payer: Self-pay

## 2019-11-25 ENCOUNTER — Encounter: Payer: Medicare Other | Admitting: Internal Medicine

## 2019-11-25 DIAGNOSIS — I890 Lymphedema, not elsewhere classified: Secondary | ICD-10-CM | POA: Diagnosis not present

## 2019-11-25 DIAGNOSIS — I87321 Chronic venous hypertension (idiopathic) with inflammation of right lower extremity: Secondary | ICD-10-CM | POA: Diagnosis not present

## 2019-11-25 DIAGNOSIS — I87312 Chronic venous hypertension (idiopathic) with ulcer of left lower extremity: Secondary | ICD-10-CM | POA: Diagnosis not present

## 2019-11-25 DIAGNOSIS — G4733 Obstructive sleep apnea (adult) (pediatric): Secondary | ICD-10-CM | POA: Diagnosis not present

## 2019-11-25 DIAGNOSIS — L97221 Non-pressure chronic ulcer of left calf limited to breakdown of skin: Secondary | ICD-10-CM | POA: Diagnosis not present

## 2019-11-25 DIAGNOSIS — L97222 Non-pressure chronic ulcer of left calf with fat layer exposed: Secondary | ICD-10-CM | POA: Diagnosis not present

## 2019-11-25 DIAGNOSIS — I872 Venous insufficiency (chronic) (peripheral): Secondary | ICD-10-CM | POA: Diagnosis not present

## 2019-11-25 DIAGNOSIS — L97818 Non-pressure chronic ulcer of other part of right lower leg with other specified severity: Secondary | ICD-10-CM | POA: Diagnosis not present

## 2019-11-26 DIAGNOSIS — S81802A Unspecified open wound, left lower leg, initial encounter: Secondary | ICD-10-CM | POA: Diagnosis not present

## 2019-11-26 DIAGNOSIS — R768 Other specified abnormal immunological findings in serum: Secondary | ICD-10-CM | POA: Diagnosis not present

## 2019-11-26 DIAGNOSIS — M1120 Other chondrocalcinosis, unspecified site: Secondary | ICD-10-CM | POA: Diagnosis not present

## 2019-11-26 DIAGNOSIS — M1990 Unspecified osteoarthritis, unspecified site: Secondary | ICD-10-CM | POA: Diagnosis not present

## 2019-11-26 DIAGNOSIS — I878 Other specified disorders of veins: Secondary | ICD-10-CM | POA: Diagnosis not present

## 2019-12-01 ENCOUNTER — Encounter: Payer: Self-pay | Admitting: Family Medicine

## 2019-12-01 DIAGNOSIS — R768 Other specified abnormal immunological findings in serum: Secondary | ICD-10-CM | POA: Insufficient documentation

## 2019-12-02 ENCOUNTER — Other Ambulatory Visit: Payer: Self-pay

## 2019-12-02 ENCOUNTER — Encounter: Payer: Medicare Other | Admitting: Internal Medicine

## 2019-12-02 DIAGNOSIS — L97818 Non-pressure chronic ulcer of other part of right lower leg with other specified severity: Secondary | ICD-10-CM | POA: Diagnosis not present

## 2019-12-02 DIAGNOSIS — L97222 Non-pressure chronic ulcer of left calf with fat layer exposed: Secondary | ICD-10-CM | POA: Diagnosis not present

## 2019-12-02 DIAGNOSIS — I87312 Chronic venous hypertension (idiopathic) with ulcer of left lower extremity: Secondary | ICD-10-CM | POA: Diagnosis not present

## 2019-12-02 DIAGNOSIS — L97221 Non-pressure chronic ulcer of left calf limited to breakdown of skin: Secondary | ICD-10-CM | POA: Diagnosis not present

## 2019-12-02 DIAGNOSIS — I87321 Chronic venous hypertension (idiopathic) with inflammation of right lower extremity: Secondary | ICD-10-CM | POA: Diagnosis not present

## 2019-12-02 DIAGNOSIS — I872 Venous insufficiency (chronic) (peripheral): Secondary | ICD-10-CM | POA: Diagnosis not present

## 2019-12-02 DIAGNOSIS — G4733 Obstructive sleep apnea (adult) (pediatric): Secondary | ICD-10-CM | POA: Diagnosis not present

## 2019-12-02 DIAGNOSIS — I890 Lymphedema, not elsewhere classified: Secondary | ICD-10-CM | POA: Diagnosis not present

## 2019-12-02 DIAGNOSIS — L97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d: Secondary | ICD-10-CM | POA: Diagnosis not present

## 2019-12-02 NOTE — Progress Notes (Signed)
[name] Visit Report for 11/25/2019 Debridement Details Patient Name: [name], [name]. Date of Service: 11/25/2019 12:45 PM Medical Record Number: [id number] Patient Account Number: [id number] Date of Birth/Sex: 10/26/1946 (74 y.o. F) Treating [name]: [name] Primary Care Provider: Ria [name] Other Clinician: Referring Provider: Ria [name] Treating Provider/Extender: [name] in Treatment: 50 Debridement Performed for Wound #6 Left,Lateral Lower Leg Assessment: Performed By: Physician Amber Dillon, MD Debridement Type: Debridement Severity of Tissue Pre Fat layer exposed Debridement: Level of Consciousness (Pre- Awake and Alert procedure): Pre-procedure Verification/Time Yes - 13:20 Out Taken: Start Time: 13:20 Pain Control: Lidocaine Total Area Debrided (L x W): 4 (cm) x 2.5 (cm) = 10 (cm) Tissue and other material Viable, Non-Viable, Slough, Subcutaneous, Biofilm, Slough debrided: Level: Skin/Subcutaneous Tissue Debridement Description: Excisional Instrument: Curette Bleeding: Minimum Hemostasis Achieved: Pressure End Time: 13:25 Response to Treatment: Procedure was tolerated well Level of Consciousness Awake and Alert (Post-procedure): Post Debridement Measurements of Total Wound Length: (cm) 4 Width: (cm) 2.5 Depth: (cm) 0.3 Volume: (cm) 2.356 Character of Wound/Ulcer Post Debridement: Requires Further Debridement Severity of Tissue Post Debridement: Fat layer exposed Post Procedure Diagnosis Same as Pre-procedure Electronic Signature(s) Signed: 11/25/2019 3:29:52 PM By: [name], [name], [name], CWS, [name], [name] Signed: 12/02/2019 4:18:13 PM By: Amber Ham MD Entered By: [name] on 11/25/2019 13:32:24 [name] -------------------------------------------------------------------------------- HPI Details Patient Name: [name], [name] Date of Service: 11/25/2019 12:45 PM Medical Record  Number: [id number] Patient Account Number: [id number] Date of Birth/Sex: 1946/04/17 (74 y.o. F) Treating [name]: [name] Primary Care Provider: Ria [name] Other Clinician: Referring Provider: Ria [name] Treating Provider/Extender: [name] in Treatment: 25 History of Present Illness HPI Description: Pleasant 74 year old with history of chronic venous insufficiency. No diabetes or peripheral vascular disease. Left ABI 1.29. Questionable history of left lower extremity DVT. She developed a recurrent ulceration on her left lateral calf in December 2015, which she attributes to poor diet and subsequent lower extremity edema. She underwent endovenous laser ablation of her left greater saphenous vein in 2010. She underwent laser ablation of accessory branch of left GSV in April 2016 by [name] at [facility]. She was previously wearing Unna boots, which she tolerated well. Tolerating 2 layer compression and cadexomer iodine. She returns to clinic for follow-up and is without new complaints. She denies any significant pain at this time. She reports persistent pain with pressure. No claudication or ischemic rest pain. No fever or chills. No drainage. READMISSION 11/13/16; this is a 74 year old woman who is not a diabetic. She is here for a review of a painful area on her left medial lower extremity. I note that she was seen here previously last year for wound I believe to be in the same area. At that time she had undergone previously a left greater saphenous vein ablation by [name] and she had a ablation of the anterior accessory branch of the left greater saphenous vein in March 2016. Seeing that the wound actually closed over. In reviewing the history with her today the ulcer in this area has been recurrent. She describes a biopsy of this area in 2009 that only showed stasis physiology. 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 Do not see  that she has ever had arterial studies in the left leg. She tells me she has a follow-up with [name] in roughly 10 days In any case she developed the reopening of this area roughly a month ago. On the background of this she describes rapidly increasing edema which has responded to Lasix 40 mg and metolazone 2.5 mg as well as the patient's lymph massage. She has been told she has both venous insufficiency and lymphedema but she cannot tolerate compression stockings 11/28/16; the patient saw [name] recently. Per the patient he did arterial Dopplers in the office that did not show evidence of arterial insufficiency, per the patient he stated "treat this like an ordinary venous ulcer". She also saw her dermatologist [name] who felt that this was more of a vascular ulcer. In general things are improving although she arrives today with increasing bilateral lower extremity edema with weeping a deeper fluid through the wound on the left medial leg compatible with some degree of lymphedema 12/04/16; the patient's wound is fully epithelialized but I don't think fully healed. We will do another week of depression with Promogran and TCA however I suspect we'll be able to discharge her next week. This is a very unusual-looking wound which was initially a figure-of-eight type wound lying on its side surrounded by petechial like hemorrhage. She has had venous ablation on this side. She apparently does not have an arterial issue per [name]. She saw her dermatologist thought it was "vascular". Patient is definitely going to need ongoing compression and I talked about this with her today she will go to elastic therapy after she leaves here next week 12/11/16; the patient's wound is not completely closed today. She  has surrounding scar tissue and in further discussion with the patient it would appear that she had ulcers in this area in 2009 for a prolonged period of time ultimately requiring a punch biopsy of this area that only showed venous insufficiency. I did not previously pickup on this part of the history from the patient. 12/18/16; the patient's wound is completely epithelialized. There is no open area here. She has significant bilateral venous insufficiency with secondary lymphedema to a mild-to-moderate degree she does not have compression stockings.. She did not say anything to me when I was in the room, she told our intake nurse that she was still having pain in this area. This isn't unusual recurrent small open area. She is going to go to elastic therapy to obtain compression stockings. 12/25/16; the patient's wound is fully epithelialized. There is no open area here. The patient describes some continued episodic discomfort in this area medial left calf. However everything looks fine and healed here. She is been to elastic therapy and caught herself 15-20 mmHg stockings, they apparently were having trouble getting 20-30 mm stockings in her size NIAJAH, MAUEL ([id number]) 01/22/17; this is a patient we discharged from the clinic a month ago. She has a recurrent open wound on her medial left calf. She had 15 mm support stockings. I told her I thought she needed 20-30 mm compression stockings. She tells me that she has been ill with hospitalization secondary to asthma and is been found to have severe hypokalemia likely secondary to a combination of Lasix and metolazone. This morning she noted blistering and leaking fluid on the posterior part of her left leg. She called our intake nurse urgently and we was saw her this afternoon. She has not had any real discomfort here. I don't  know that she's been wearing any stockings on this leg for at least 2-3 days. ABIs in this clinic were 1.21 on the right and  1.3 on the left. She is previously seen vascular surgery who does not think that there is a peripheral arterial issue. 01/30/17; Patient arrives with no open wound on the left leg. She has been to elastic therapy and obtained 20-22mmhg below knee stockings and she has one on the right leg today. READMISSION 02/19/18; this [name] is a now 74 year old patient we've had in this clinic perhaps 3 times before. I had last looked at her from January 07 December 2016 with an area on the medial left leg. We discharged her on 12/25/16 however she had to be readmitted on 01/22/17 with a recurrence. I have in my notes that we discharged her on 20-30 mm stockings although she tells me she was only wearing support hose because she cannot get stockings on predominantly related to her cervical spine surgery/issues. She has had previous ablations done by vein and vascular in [place] including a great saphenous vein ablation on the left with an anterior accessory branch ablation I think both of these were in 2016. On one of the previous visit she had a biopsy noted 2009 that was negative. She is not felt to have an arterial issue. She is not a diabetic. She does have a history of obstructive sleep apnea hypertension asthma as well as chronic venous insufficiency and lymphedema. On this occasion she noted 2 dry scaly patch on her left leg. She tried to put lotion on this it didn't really help. There were 2 open areas.the patient has been seeing her primary physician from 02/05/18 through 02/14/18. She had Unna boots applied. The superior wound now on the lateral left leg has closed but she's had one wound that remains open on the lateral left leg. This is not the same spot as we dealt with in 2018. ABIs in this clinic were 1.3 bilaterally 02/26/18; patient has a small wound on the left lateral calf. Dimensions are down. She has chronic venous insufficiency and lymphedema. 03/05/18; small open area on the left lateral calf.  Dimensions are down. Tightly adherent necrotic debris over the surface of the wound which was difficult to remove. Also the dressing [over collagen] stuck to the wound surface. This was removed with some difficulty as well. Change the primary dressing to Hydrofera Blue ready 03/12/18; small open area on the left lateral calf. Comes in with tightly adherent surface eschar as well as some adherent Hydrofera Blue. 03/19/18; open area on the left lateral calf. Again adherent surface eschar as well as some adherent Hydrofera Blue nonviable subcutaneous tissue. She complained of pain all week even with the reduction from 4-3 layer compression I put on last week. Also she had an increase in her ankle and calf measurements probably related to the same thing. 03/26/18; open area on the left lateral calf. A very small open area remains here. We used silver alginate starting last week as the Hydrofera Blue seem to stick to the wound bed. In using 4-layer compression 04/02/18; the open area in the left lateral calf at some adherent slough which I removed there is no open area here. We are able to transition her into her own compression stocking. Truthfully I think this is probably his support hose. However this does not maintain skin integrity will be limited. She cannot put over the toe compression stockings on because of neck problems hand problems etc.  She is allergic to the lining layer of juxta lites. We might be forced to use extremitease stocking should this fail READMIT 11/24/2018 Patient is now a 74 year old woman who is not a diabetic. She has been in this clinic on at least 3 previous occasions largely with recurrent wounds on her left leg secondary to chronic venous insufficiency with secondary lymphedema. Her situation is complicated by inability to get stockings on and an allergy to neoprene which is apparently a component and at least juxta lites and other stockings. As a result she really has not  been wearing any stockings on her legs. She tells [place] that roughly 2 or 3 weeks ago she started noticing a stinging sensation just above her ankle on the left medial aspect. She has been diagnosed with pseudogout and she wondered whether this was what she was experiencing. She tried to dress this with something she bought at the store however subsequently it pulled skin off and now she has an open wound that is not improving. She has been using Vaseline gauze with a cover bandage. She saw her primary doctor last week who put an Haematologist on her. ABIs in this clinic was 1.03 on the left [name], [name]. ([id number]) 2/12; the area is on the left medial ankle. Odd-looking wound with what looks to be surface epithelialization but a multitude of small petechial openings. This clearly not closed yet. We have been using silver alginate under 3 layer compression with TCA 2/19; the wound area did not look quite as good this week. Necrotic debris over the majority of the wound surface which required debridement. She continues to have a multitude of what looked to be small petechial openings. She reminds [place] that she had a biopsy on this initially during her first outbreak in 2015 in [place] [facility]. She expresses concern about this being a possible melanoma. 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 I assured her that this does not look anything like melanoma. Except for the petechial reaction it does look like a venous insufficiency area and she certainly has evidence of this on both sides 2/26; a difficult area on the left medial ankle. The patient clearly has chronic venous hypertension with some degree of lymphedema. The odd thing about the area is the small petechial hemorrhages. I am not really sure how to explain this. This was present last time and this is not a compression injury. We have been using  Hydrofera Blue which I changed to last week 3/4; still using Hydrofera Blue. Aggressive debridement today. She does not have known arterial issues. She has seen [name] at [facility] vein and vascular and and has an ablation on the left. [Anterior accessory branch of the greater saphenous]. From what I remember they did not feel she had an arterial issue. The patient has had this area biopsied in 2009 at [facility] dermatology and by her recollection they said this was "stasis". She is also follow-up with dermatology locally who thought that this was more of a vascular issue 3/11; using Hydrofera Blue. Aggressive debridement today. She does not have an arterial issue. We are using 3 layer compression although we may need to go to 4. The patient has been in for multiple changes to her wrap since I last saw her a week ago. She says that the area was leaking. I do not have too much more information on what was found 01/19/19  on evaluation today patient was actually being seen for a nurse visit when unfortunately she had the area on her left lateral lower extremity as well as weeping from the right lower extremity that became apparent. Therefore we did end up actually seeing her for a full visit with myself. She is having some pain at this site as well but fortunately nothing too significant at this point. No fevers, chills, nausea, or vomiting noted at this time. 3/18-Patient is back to the clinic with the left leg venous leg ulcer, the ulcer is larger in size, has a surface that is densely adherent with fibrinous tissue, the Hydrofera Blue was used but is densely adherent and there was difficulty in removing it. The right lower extremity was also wrapped for weeping edema. Patient has a new area over the left lateral foot above the malleolus that is small and appears to have no debris with intact surrounding skin. Patient is on increased dose of Lasix also as a means to edema management 3/25; the  patient has a nonhealing venous ulcer on the medial left leg and last week developed a smaller area on the lateral left calf. We have been using Hydrofera Blue with a contact layer. 4/1; no major change in these wounds areas. Left medial and more recently left lateral calf. I tried Iodoflex last week to aid in debridement she did not tolerate this. She stated her pain was terrible all week. She took the top layer of the 4 layer compression off. 4/8; the patient actually looks somewhat better in terms of her more prominent left lateral calf wound. There is some healthy looking tissue here. She is still complaining of a lot of discomfort. 4/15; patient in a lot of pain secondary to sciatica. She is on a prednisone taper prescribed by her primary physician. She has the 2 areas one on the left medial and more recently a smaller area on the left lateral calf. Both of these just above the malleoli 4/22; her back pain is better but she still states she is very uncomfortable and now feels she is intolerant to the [employer]. No real change in the wounds we have been using Sorbact. She has been previously intolerant to Iodoflex. There is not a lot of option about what we can use to debride this wound under compression that she no doubt needs. sHe states Ultram no longer works for her pain 4/29; no major change in the wounds slightly increased depth. Surface on the original medial wound perhaps somewhat improved however the more recent area on the lateral left ankle is 100% covered in very adherent debris we have been using Sorbact. She tolerates 4 layer compression well and her edema control is a lot better. She has not had to come in for a nurse check 5/6; no major change in the condition of the wounds. She did consent to debridement today which was done with some difficulty. Continuing Sorbact. She did not tolerate Iodoflex. She was in for a check of her compression the day after we wrapped her last week  this was adjusted but nothing much was found 5/13; no major change in the condition or area of the wounds. I was able to get a fairly aggressive debridement done on the lateral left leg wound. Even using Sorbact under compression. She came back in on Friday to have the wrap changed. She says she felt uncomfortable on the lateral aspect of her ankle. She has a long history of chronic venous insufficiency  including previous ablation surgery on this side. 5/20-Patient returns for wounds on left leg with both wounds covered in slough, with the lateral leg wound larger in size, she has been in 3 layer compression and felt more comfortable, she describes pain in ankle, in leg and pins and needles in foot, and is about to try Pamelor for this 6/3; wounds on the left lateral and left medial leg. The area medially which is the most recent of the 2 seems to have had the largest increase in dimensions. We have been using Sorbac to try and debride the surface. She has been to see orthopedics they apparently did a plain x-ray that was indeterminant. Diagnosed her with neuropathy and they have ordered an MRI to [name], [name]. ([id number]) determine if there is underlying osteomyelitis. This was not high on my thought list but I suppose it is prudent. We have advised her to make an appointment with vein and vascular in [place]. She has a history of a left greater saphenous and accessory vein ablations I wonder if there is anything else that can be done from a surgical point of view to help in these difficult refractory wounds. We have previously healed this wound on one occasion but it keeps on reopening [medial side] 6/10; deep tissue culture I did last week I think on the left medial wound showed both moderate E. coli and moderate staph aureus [MSSA]. She is going to require antibiotics and I have chosen Augmentin. We have been using Sorbact and we have made better looking wound surface on both sides but  certainly no improvement in wound area. She was back in last Friday apparently for a dressing changes the wrap was hurting her outer left ankle. She has not managed to get a hold of vein and vascular in [place]. We are going to have to make her that appointment 6/17; patient is tolerating the Augmentin. She had an MRI that I think was ordered by orthopedic surgeon this did not show osteomyelitis or an abscess did suggest cellulitis. We have been using Sorbact to the lateral and medial ankles. We have been trying to arrange a follow-up appointment with vein and vascular in [place] or did her original ablations. We apparently an area sent the request to vein and vascular in [facility] 6/24; patient has completed the Augmentin. We do not yet have a vein and vascular appointment in [place]. I am not sure what the issue is here we have asked her to call tomorrow. We are using Sorbact. Making some improvements and especially the medial wound. Both surfaces however look better medial and lateral. 7/1; the patient has been in contact with vein and vascular in [place] but has not yet received an appointment. Using Sorbact we have gradually improve the wound surface with no improvement in surface area. She is approved for Apligraf but the wound surface still is not completely viable. She has not had to come in for a dressing change 7/8; the patient has an appointment with vein and vascular on 7/31 which is a Friday afternoon. She is concerned about getting back here for [place] to dress her wounds. I think it is important to have them goal for her venous reflux/history of ablations etc. to see if anything else can be done. She apparently tested positive for 1 of the blood tests with regards to lupus and saw a rheumatologist. He has raised the issue of vasculitis again. I have had this thought in the past however the evidence seems  overwhelming that this is a venous reflux etiology. If the  rheumatologist tells me there is clinical and laboratory investigation is positive for lupus I will rethink this. 7/15; the patient's wound surfaces are quite a bit better. The medial area which was her original wound now has no depth although the lateral wound which was the more recent area actually appears larger. Both with viable surfaces which is indeed better. Using Sorbact. I wanted to use Apligraf on her however there is the issue of the vein and vascular appointment on 7/31 at 2:00 in the afternoon which would not allow her to get back to be rewrapped and they would no doubt remove the graft 7/22; the patient's wound surfaces have moderate amount of debris although generally look better. The lateral one is larger with 2 small satellite areas superiorly. We are waiting for her vein and vascular appointment on 7/31. She has been approved for Apligraf which I would like to use after th 7/29; wound surfaces have improved no debridement is required we have been using Sorbact. She sees vein and vascular on Friday with this so question of whether anything can be done to lessen the likelihood of recurrence and/or speed the healing of these areas. She is already had previous ablations. She no doubt has severe venous hypertension 8/5-Patient returns at 1 week, she was in [place] for 3 days by her podiatrist, we have been using so backed to the wound, she has increased pain in both the wounds on the left lower leg especially the more distal one on the lateral aspect 8/12-Patient returns at 1 week and she is agreeable to having debridement in both wounds on her left leg today. We have been using Sorbact, and vascular studies were reviewed at last visit 8/19; the patient arrives with her wounds fairly clean and no debridement is required. We have used Sorbact which is really done a nice job in cleaning up these very difficult wound surfaces. The patient saw Dr. Donzetta Matters of vascular surgery on 7/31. He did  not feel that there was an arterial component. He felt that her treated greater saphenous vein is adequately addressed and that the small saphenous vein did not appear to be involved significantly. She was also noted to have deep venous reflux which is not treatable. Dr. Donzetta Matters mentioned the possibility of a central obstructive component leading to reflux and he offered her central venography. She wanted to discuss this or think about it. I have urged her to go ahead with this. She has had recurrent difficult wounds in these areas which do heal but after months in the clinic. If there is anything that can be done to reduce the likelihood of this I think it is worth it. 9/2 she is still working towards getting follow-up with Dr. Donzetta Matters to schedule her CT. Things are quite a bit worse venography. I put Apligraf on 2 weeks ago on both wounds on the medial and lateral part of her left lower leg. She arrives in clinic today with 3 superficial additional wounds above the area laterally and one below the wound medially. She describes a lot of discomfort. I think these are probably wrapped injuries. Does not look like she has cellulitis. 07/20/2019 on evaluation today patient appears to be doing somewhat poorly in regard to her lower extremity ulcers. She in fact showed signs of erythema in fact we may even be dealing with an infection at this time. Unfortunately I am unsure if this is just infection  or if indeed there may be some allergic reaction that occurred as a result of the Apligraf application. With that being said that would be unusual but nonetheless not impossible in this patient is one who is unfortunately allergic to quite a bit. Currently we have been using the Sorbact which seems to do as well as anything for her. I do think we may want to obtain a culture today to see if there is anything showing up there that may need to be addressed. [name], [name] 9/16; noted that last week the  wounds look worse in 1 week follow-up of the Apligraf. Using Sorbact as of 2 days ago. She arrives with copious amounts of drainage and new skin breakdown on the back of the left calf. The wounds arm more substantial bilaterally. There is a fair amount of swelling in the left calf no overt DVT there is edema present I think in the left greater than right thigh. She is supposed to go on 9/28 for CT venography. The wounds on the medial and lateral calf are worse and she has new skin breakdown posteriorly at least new for me. This is almost developing into a circumferential wound area The Apligraf was taken off last week which I agree with things are not going in the right direction a culture was done we do not have that back yet. She is on Augmentin that she started 2 days ago 9/23; dressing was changed by her nurses on Monday. In general there is no improvement in the wound areas although the area looks less angry than last week. She did get Augmentin for MSSA cultured on the 14th. She still appears to have too much swelling in the left leg even with 3 layer compression 9/30; the patient underwent her procedure on 9/28 by Dr. Donzetta Matters [facility]. She was discovered to have the common iliac vein measuring 12.2 mm but at the level of L4-L5 measured 3 mm. After stenting it measured 10 mm. 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 Rouleaux flow in the common femoral and femoral vein was observed much improved after stenting. We are using silver alginate to the wounds on the medial and lateral ankle on the left. 4 layer compression 10/7; the patient had fluid swelling around her knee and 4 layer compression. At the advice of vein and vascular this was reduced to 3 layer which she is tolerating better. We have been using silver alginate under 3 layer compression since last Friday 10/14; arrives with the areas on the left ankle looking a lot better. Inflammation in the  area also a lot better. She came in for a nurse check on 10/9 10/21; continued nice improvement. Slight improvements in surface area of both the medial and lateral wounds on the left. A lot of the satellite lesions in the weeping erythema around these from stasis dermatitis is resolved. We have been using silver alginate 10/28; general improvement in the entire wound areas although not a lot of change in dimensions the wound certainly looks better. There is a lot less in terms of venous inflammation. Continue silver alginate this week however look towards Hydrofera Blue next week 11/4; very adherent debris on the medial wound left wound is not as bad. We have been using silver alginate. Change to [facility] today 11/11; very adherent debris on both wound areas. She went to vein and vascular last week and follow-up they put in [place] boot on this today.  He says the [facility] was adherent. Wound is definitely not as good as last week. Especially on the left there the satellite lesions look more prominent 11/18; absolutely no better. erythema on lateral aspect with tenderness. 09/30/2019 on evaluation today patient appears to actually be doing better. Dr. Dellia Nims did put her on doxycycline last week which I do believe has helped her at this point. Fortunately there is no signs of active infection at this time. No fevers, chills, nausea, vomiting, or diarrhea. I do believe he may want extend the doxycycline for 7 additional days just to ensure everything does completely cleared up the patient is in agreement with that plan. Otherwise she is going require some sharp debridement today 12/2; patient is completing a 2-week course of doxycycline. I gave her this empirically for inflammation as well as infection when I last saw her 2 weeks ago. All of this seems to be better. She is using silver alginate she has the area on the medial aspect of the larger area laterally and the 2 small satellite  regions laterally above the major wound. 12/9; the patient's wound on the left medial and left lateral calf look really quite good. We have been using silver alginate. She saw vein and vascular in follow-up on 10/09/2019. She has had a previous left greater saphenous vein ablation by Dr. Oscar La in 2016. More recently she underwent a left common iliac vein stent by Dr. 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 The swelling is improved and certainly the wounds have improved. The patient shows [place] today area on the right medial calf there is almost no wound but leaking lymphedema. She says she start this started 3 or 4 days ago. She did not traumatize it. It is not painful. She does not wear compression on that side 12/16; the patient continues to do well laterally. Medially still requiring debridement. The area on the right calf did not materialize to anything and is not currently open. We wrapped this last time. She has support stockings for that leg although I am not sure they are going to provide adequate compression 12/23; the lateral wound looks stable. Medially still requiring debridement for tightly adherent fibrinous debris. We've been using silver alginate. Surface area not any different 12/30; neither wound is any better with regards to surface and the area on the left lateral is larger. I been using silver alginate to the left lateral which look quite good last week and Sorbact to the left medial 11/11/2019. Lateral wound area actually looks better and somewhat smaller. Medial still requires a very aggressive debridement [name], [name] today. We have been using Sorbact on both wound areas 1/13; not much better still adherent debris bilaterally. I been using Sorbact. She has severe venous hypertension. Probably some degree of dermal fibrosis distally. I wonder whether tighter compression might help and I am going to try that today. We also need to work on the  bioburden 1/20; using Sorbact. She has severe venous hypertension status post stent placement for pelvic vein compression. We applied gentamicin last time to see if we could reduce bioburden I had some discussion with her today about the use of pentoxifylline. This is occasionally used in this setting for wounds with refractory venous insufficiency. However this interacts with Plavix. She tells me that she was put on this after stent placement for 3 months. She will call Dr. Claretha Cooper office to discuss Electronic Signature(s) Signed: 12/02/2019 4:18:13 PM By: Amber Ham MD Entered  By: [name] on 11/25/2019 13:35:40 Stech, [name] -------------------------------------------------------------------------------- Physical Exam Details Patient Name: AMANDINE, AUGER. Date of Service: 11/25/2019 12:45 PM Medical Record Number: [id number] Patient Account Number: [id number] Date of Birth/Sex: 03-Apr-1946 (74 y.o. F) Treating [name]: [name] Primary Care Provider: Ria [name] Other Clinician: Referring Provider: Ria [name] Treating Provider/Extender: [name] in Treatment: 43 Constitutional Patient is hypertensive.Marland Kitchen Respirations regular, non-labored and within target range.. Temperature is normal and within the target range for the patient.Marland Kitchen appears in no distress. Notes Wound examoboth areas on the lateral and medial ankle on the left about the same size. The area medially has a better surface today the area laterally requires debridement with an open curette. Removing adherent necrotic debris Electronic Signature(s) Signed: 12/02/2019 4:18:13 PM By: Amber Ham MD Entered By: [name] on 11/25/2019 13:36:30 Blumenthal, [name] -------------------------------------------------------------------------------- Physician Orders Details Patient Name: KRYSTI, DEIST. Date of Service: 11/25/2019 12:45 PM Medical Record Number:  [id number] Patient Account Number: [id number] Date of Birth/Sex: 20-May-1946 (74 y.o. F) Treating [name]: [name] Primary Care Provider: Ria [name] Other Clinician: Referring Provider: Ria [name] Treating Provider/Extender: [name] in Treatment: 66 Verbal / Phone Orders: No Diagnosis Coding Wound Cleansing Wound #5 Left,Medial Lower Leg o Cleanse wound with mild soap and water Wound #6 Left,Lateral Lower Leg o Cleanse wound with mild soap and water Wound #9 Left,Lateral,Posterior Lower Leg o Cleanse wound with mild soap and water Anesthetic (add to Medication List) Wound #5 Left,Medial Lower Leg o Topical Lidocaine 4% cream applied to wound bed prior to debridement (In Clinic Only). Wound #6 Left,Lateral Lower Leg o Topical Lidocaine 4% cream applied to wound bed prior to debridement (In Clinic Only). Wound #9 Left,Lateral,Posterior Lower Leg o Topical Lidocaine 4% cream applied to wound bed prior to debridement (In Clinic Only). Skin Barriers/Peri-Wound Care Wound #5 Left,Medial Lower Leg o Other: - Gentamycin Wound #6 Left,Lateral Lower Leg o Other: - Gentamycin Wound #9 Left,Lateral,Posterior Lower Leg o Other: - Gentamycin Primary Wound Dressing Wound #5 Left,Medial Lower Leg o Other: - sorbact Wound #6 Left,Lateral Lower Leg o Other: - sorbact Wound #9 Left,Lateral,Posterior Lower Leg o Other: - sorbact Secondary Dressing Wound #5 Left,Medial Lower Leg o Drawtex [name], [name]. ([id number]) Wound #6 Left,Lateral Lower Leg o Drawtex Wound #9 Left,Lateral,Posterior Lower Leg o Drawtex Dressing Change Frequency Wound #5 Left,Medial Lower Leg o Change dressing every week o Other: - as needed. Wound #6 Left,Lateral Lower Leg o Change dressing every week o Other: - as needed. Wound #9 Left,Lateral,Posterior Lower Leg o Change dressing every week o Other: - as needed. Follow-up Appointments Wound  #5 Left,Medial Lower Leg o Return Appointment in 1 week. o Nurse Visit as needed - Call of needed Wound #6 Left,Lateral Lower Leg o Return Appointment in 1 week. o Nurse Visit as needed - Call of needed Wound #9 Left,Lateral,Posterior Lower Leg o Return Appointment in 1 week. o Nurse Visit as needed - Call of needed Edema Control Wound #5 Left,Medial Lower Leg o 4-Layer Compression System - Left Lower Extremity. Wound #6 Left,Lateral Lower Leg o 4-Layer Compression System - Left Lower Extremity. Wound #9 Left,Lateral,Posterior Lower Leg o 4-Layer Compression System - Left Lower Extremity. Electronic Signature(s) Signed: 11/25/2019 3:29:52 PM By: [name], [name], [name], CWS, [name], [name] Signed: 12/02/2019 4:18:13 PM By: Amber Ham MD Entered By: [name], [name], [name], CWS, Amber on 11/25/2019 13:23:35 [name], [name] -------------------------------------------------------------------------------- Problem List Details Patient Name: VANELOPE, EUCEDA  J. Date of Service: 11/25/2019 12:45 PM Medical Record Number: [id number] Patient Account Number: [id number] Date of Birth/Sex: 1946/05/13 (74 y.o. F) Treating [name]: [name] Primary Care Provider: Ria [name] Other Clinician: Referring Provider: Ria [name] Treating Provider/Extender: [name] in Treatment: 63 Active Problems ICD-10 Evaluated Encounter Code Description Active Date Today Diagnosis L97.221 Non-pressure chronic ulcer of left calf limited to breakdown of 01/07/2019 No Yes skin I87.321 Chronic venous hypertension (idiopathic) with inflammation of 12/10/2018 No Yes right lower extremity I89.0 Lymphedema, not elsewhere classified 12/10/2018 No Yes L97.818 Non-pressure chronic ulcer of other part of right lower leg 10/14/2019 No Yes with other specified severity Inactive Problems Resolved Problems Electronic Signature(s) Signed: 12/02/2019 4:18:13 PM By: Amber Ham MD Entered By:  [name] on 11/25/2019 13:31:54 Swint, [name] -------------------------------------------------------------------------------- Progress Note Details Patient Name: LIANNE, BAILIN. Date of Service: 11/25/2019 12:45 PM Medical Record Number: [id number] Patient Account Number: [id number] Date of Birth/Sex: [birth year] (74 y.o. F) Treating [name]: [name] Primary Care Provider: Ria [name] Other Clinician: Referring Provider: Ria [name] Treating Provider/Extender: [name] in Treatment: 50 Subjective History of Present Illness (HPI) Pleasant 74 year old with history of chronic venous insufficiency. No diabetes or peripheral vascular disease. Left ABI 1.29. Questionable history of left lower extremity DVT. She developed a recurrent ulceration on her left lateral calf in December 2015, which she attributes to poor diet and subsequent lower extremity edema. She underwent endovenous laser ablation of her left greater saphenous vein in 2010. She underwent laser ablation of accessory branch of left GSV in April 2016 by [name] at [facility]. She was previously wearing Unna boots, which she tolerated well. Tolerating 2 layer compression and cadexomer iodine. She returns to clinic for follow-up and is without new complaints. She denies any significant pain at this time. She reports persistent pain with pressure. No claudication or ischemic rest pain. No fever or chills. No drainage. READMISSION 11/13/16; this is a 74 year old woman who is not a diabetic. She is here for a review of a painful area on her left medial lower extremity. I note that she was seen here previously last year for wound I believe to be in the same area. At that time she had undergone previously a left greater saphenous vein ablation by [name] and she had a ablation of the anterior accessory branch of the left greater saphenous vein in March 2016. Seeing that the wound actually  closed over. In reviewing the history with her today the ulcer in this area has been recurrent. She describes a biopsy of this area in 2009 that only showed stasis physiology. 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 Do not see that she has ever had arterial studies in the left leg. She tells me she has a follow-up with [name] in roughly 10 days In any case she developed the reopening of this area roughly a month ago. On the background of this she describes rapidly increasing edema which has responded to Lasix 40 mg and metolazone 2.5 mg as well as the patient's lymph massage. She has been told she has both venous insufficiency and lymphedema but she cannot tolerate compression stockings 11/28/16; the patient saw [name] recently. Per the patient he did arterial Dopplers  in the office that did not show evidence of arterial insufficiency, per the patient he stated "treat this like an ordinary venous ulcer". She also saw her dermatologist [name] who felt that this was more of a vascular ulcer. In general things are improving although she arrives today with increasing bilateral lower extremity edema with weeping a deeper fluid through the wound on the left medial leg compatible with some degree of lymphedema 12/04/16; the patient's wound is fully epithelialized but I don't think fully healed. We will do another week of depression with Promogran and TCA however I suspect we'll be able to discharge her next week. This is a very unusual-looking wound which was initially a figure-of-eight type wound lying on its side surrounded by petechial like hemorrhage. She has had venous ablation on this side. She apparently does not have an arterial issue per [name]. She saw her  dermatologist thought it was "vascular". Patient is definitely going to need ongoing compression and I talked about this with her today she will go to elastic therapy after she leaves here next week 12/11/16; the patient's wound is not completely closed today. She has surrounding scar tissue and in further discussion with the patient it would appear that she had ulcers in this area in 2009 for a prolonged period of time ultimately requiring a punch biopsy of this area that only showed venous insufficiency. I did not previously pickup on this part of the history from the patient. 12/18/16; the patient's wound is completely epithelialized. There is no open area here. She has significant bilateral venous insufficiency with secondary lymphedema to a mild-to-moderate degree she does not have compression stockings.. She did not say anything to me when I was in the room, she told our intake nurse that she was still having pain in this area. This isn't unusual recurrent small open area. She is going to go to elastic therapy to obtain compression stockings. 12/25/16; the patient's wound is fully epithelialized. There is no open area here. The patient describes some continued episodic discomfort in this area medial left calf. However everything looks fine and healed here. She is been to elastic therapy and ADALEE, ABDO ([id number]) caught herself 15-20 mmHg stockings, they apparently were having trouble getting 20-30 mm stockings in her size 01/22/17; this is a patient we discharged from the clinic a month ago. She has a recurrent open wound on her medial left calf. She had 15 mm support stockings. I told her I thought she needed 20-30 mm compression stockings. She tells me that she has been ill with hospitalization secondary to asthma and is been found to have severe hypokalemia likely secondary to a combination of Lasix and metolazone. This morning she noted blistering and leaking fluid on the posterior part  of her left leg. She called our intake nurse urgently and we was saw her this afternoon. She has not had any real discomfort here. I don't know that she's been wearing any stockings on this leg for at least 2-3 days. ABIs in this clinic were 1.21 on the right and 1.3 on the left. She is previously seen vascular surgery who does not think that there is a peripheral arterial issue. 01/30/17; Patient arrives with no open wound on the left leg. She has been to elastic therapy and obtained 20-50mmhg below knee stockings and she has one on the right leg today. READMISSION 02/19/18; this Haydel is a now 74 year old patient we've had in this clinic perhaps 3 times before. I  had last looked at her from January 07 December 2016 with an area on the medial left leg. We discharged her on 12/25/16 however she had to be readmitted on 01/22/17 with a recurrence. I have in my notes that we discharged her on 20-30 mm stockings although she tells me she was only wearing support hose because she cannot get stockings on predominantly related to her cervical spine surgery/issues. She has had previous ablations done by vein and vascular in [place] including a great saphenous vein ablation on the left with an anterior accessory branch ablation I think both of these were in 2016. On one of the previous visit she had a biopsy noted 2009 that was negative. She is not felt to have an arterial issue. She is not a diabetic. She does have a history of obstructive sleep apnea hypertension asthma as well as chronic venous insufficiency and lymphedema. On this occasion she noted 2 dry scaly patch on her left leg. She tried to put lotion on this it didn't really help. There were 2 open areas.the patient has been seeing her primary physician from 02/05/18 through 02/14/18. She had Unna boots applied. The superior wound now on the lateral left leg has closed but she's had one wound that remains open on the lateral left leg. This is not the  same spot as we dealt with in 2018. ABIs in this clinic were 1.3 bilaterally 02/26/18; patient has a small wound on the left lateral calf. Dimensions are down. She has chronic venous insufficiency and lymphedema. 03/05/18; small open area on the left lateral calf. Dimensions are down. Tightly adherent necrotic debris over the surface of the wound which was difficult to remove. Also the dressing [over collagen] stuck to the wound surface. This was removed with some difficulty as well. Change the primary dressing to Hydrofera Blue ready 03/12/18; small open area on the left lateral calf. Comes in with tightly adherent surface eschar as well as some adherent Hydrofera Blue. 03/19/18; open area on the left lateral calf. Again adherent surface eschar as well as some adherent Hydrofera Blue nonviable subcutaneous tissue. She complained of pain all week even with the reduction from 4-3 layer compression I put on last week. Also she had an increase in her ankle and calf measurements probably related to the same thing. 03/26/18; open area on the left lateral calf. A very small open area remains here. We used silver alginate starting last week as the Hydrofera Blue seem to stick to the wound bed. In using 4-layer compression 04/02/18; the open area in the left lateral calf at some adherent slough which I removed there is no open area here. We are able to transition her into her own compression stocking. Truthfully I think this is probably his support hose. However this does not maintain skin integrity will be limited. She cannot put over the toe compression stockings on because of neck problems hand problems etc. She is allergic to the lining layer of juxta lites. We might be forced to use extremitease stocking should this fail READMIT 11/24/2018 Patient is now a 74 year old woman who is not a diabetic. She has been in this clinic on at least 3 previous occasions largely with recurrent wounds on her left leg  secondary to chronic venous insufficiency with secondary lymphedema. Her situation is complicated by inability to get stockings on and an allergy to neoprene which is apparently a component and at least juxta lites and other stockings. As a result she really has not  been wearing any stockings on her legs. She tells [place] that roughly 2 or 3 weeks ago she started noticing a stinging sensation just above her ankle on the left medial aspect. She has been diagnosed with pseudogout and she wondered whether this was what she was experiencing. She tried to dress this with something she bought at the store however subsequently it pulled skin off and now she has an open wound that is not improving. She has been using Vaseline gauze with a cover bandage. She saw her primary doctor last week who put an Haematologist on her. ABIs in this clinic was 1.03 on the left [name], [name]. ([id number]) 2/12; the area is on the left medial ankle. Odd-looking wound with what looks to be surface epithelialization but a multitude of small petechial openings. This clearly not closed yet. We have been using silver alginate under 3 layer compression with TCA 2/19; the wound area did not look quite as good this week. Necrotic debris over the majority of the wound surface which required debridement. She continues to have a multitude of what looked to be small petechial openings. She reminds [place] that she had a biopsy on this initially during her first outbreak in 2015 in [place] [facility]. She expresses concern about this being a possible melanoma. 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 I assured her that this does not look anything like melanoma. Except for the petechial reaction it does look like a venous insufficiency area and she certainly has evidence of this on both sides 2/26; a difficult area on the left medial ankle. The patient  clearly has chronic venous hypertension with some degree of lymphedema. The odd thing about the area is the small petechial hemorrhages. I am not really sure how to explain this. This was present last time and this is not a compression injury. We have been using Hydrofera Blue which I changed to last week 3/4; still using Hydrofera Blue. Aggressive debridement today. She does not have known arterial issues. She has seen [name] at [facility] vein and vascular and and has an ablation on the left. [Anterior accessory branch of the greater saphenous]. From what I remember they did not feel she had an arterial issue. The patient has had this area biopsied in 2009 at [facility] dermatology and by her recollection they said this was "stasis". She is also follow-up with dermatology locally who thought that this was more of a vascular issue 3/11; using Hydrofera Blue. Aggressive debridement today. She does not have an arterial issue. We are using 3 layer compression although we may need to go to 4. The patient has been in for multiple changes to her wrap since I last saw her a week ago. She says that the area was leaking. I do not have too much more information on what was found 01/19/19 on evaluation today patient was actually being seen for a nurse visit when unfortunately she had the area on her left lateral lower extremity as well as weeping from the right lower extremity that became apparent. Therefore we did end up actually seeing her for a full visit with myself. She is having some pain at this site as well but fortunately nothing too significant at this point. No fevers, chills, nausea, or vomiting noted at this time. 3/18-Patient is back to the clinic with the left leg venous leg ulcer, the ulcer is larger in size, has  a surface that is densely adherent with fibrinous tissue, the Hydrofera Blue was used but is densely adherent and there was difficulty in removing it. The right lower extremity was  also wrapped for weeping edema. Patient has a new area over the left lateral foot above the malleolus that is small and appears to have no debris with intact surrounding skin. Patient is on increased dose of Lasix also as a means to edema management 3/25; the patient has a nonhealing venous ulcer on the medial left leg and last week developed a smaller area on the lateral left calf. We have been using Hydrofera Blue with a contact layer. 4/1; no major change in these wounds areas. Left medial and more recently left lateral calf. I tried Iodoflex last week to aid in debridement she did not tolerate this. She stated her pain was terrible all week. She took the top layer of the 4 layer compression off. 4/8; the patient actually looks somewhat better in terms of her more prominent left lateral calf wound. There is some healthy looking tissue here. She is still complaining of a lot of discomfort. 4/15; patient in a lot of pain secondary to sciatica. She is on a prednisone taper prescribed by her primary physician. She has the 2 areas one on the left medial and more recently a smaller area on the left lateral calf. Both of these just above the malleoli 4/22; her back pain is better but she still states she is very uncomfortable and now feels she is intolerant to the [employer]. No real change in the wounds we have been using Sorbact. She has been previously intolerant to Iodoflex. There is not a lot of option about what we can use to debride this wound under compression that she no doubt needs. sHe states Ultram no longer works for her pain 4/29; no major change in the wounds slightly increased depth. Surface on the original medial wound perhaps somewhat improved however the more recent area on the lateral left ankle is 100% covered in very adherent debris we have been using Sorbact. She tolerates 4 layer compression well and her edema control is a lot better. She has not had to come in for a  nurse check 5/6; no major change in the condition of the wounds. She did consent to debridement today which was done with some difficulty. Continuing Sorbact. She did not tolerate Iodoflex. She was in for a check of her compression the day after we wrapped her last week this was adjusted but nothing much was found 5/13; no major change in the condition or area of the wounds. I was able to get a fairly aggressive debridement done on the lateral left leg wound. Even using Sorbact under compression. She came back in on Friday to have the wrap changed. She says she felt uncomfortable on the lateral aspect of her ankle. She has a long history of chronic venous insufficiency including previous ablation surgery on this side. 5/20-Patient returns for wounds on left leg with both wounds covered in slough, with the lateral leg wound larger in size, she has been in 3 layer compression and felt more comfortable, she describes pain in ankle, in leg and pins and needles in foot, and is about to try Pamelor for this 6/3; wounds on the left lateral and left medial leg. The area medially which is the most recent of the 2 seems to have had the largest increase in dimensions. We have been using Sorbac to try and  debride the surface. She has been to see orthopedics [name], [name] they apparently did a plain x-ray that was indeterminant. Diagnosed her with neuropathy and they have ordered an MRI to determine if there is underlying osteomyelitis. This was not high on my thought list but I suppose it is prudent. We have advised her to make an appointment with vein and vascular in [place]. She has a history of a left greater saphenous and accessory vein ablations I wonder if there is anything else that can be done from a surgical point of view to help in these difficult refractory wounds. We have previously healed this wound on one occasion but it keeps on reopening [medial side] 6/10; deep tissue  culture I did last week I think on the left medial wound showed both moderate E. coli and moderate staph aureus [MSSA]. She is going to require antibiotics and I have chosen Augmentin. We have been using Sorbact and we have made better looking wound surface on both sides but certainly no improvement in wound area. She was back in last Friday apparently for a dressing changes the wrap was hurting her outer left ankle. She has not managed to get a hold of vein and vascular in [place]. We are going to have to make her that appointment 6/17; patient is tolerating the Augmentin. She had an MRI that I think was ordered by orthopedic surgeon this did not show osteomyelitis or an abscess did suggest cellulitis. We have been using Sorbact to the lateral and medial ankles. We have been trying to arrange a follow-up appointment with vein and vascular in [place] or did her original ablations. We apparently an area sent the request to vein and vascular in [facility] 6/24; patient has completed the Augmentin. We do not yet have a vein and vascular appointment in [place]. I am not sure what the issue is here we have asked her to call tomorrow. We are using Sorbact. Making some improvements and especially the medial wound. Both surfaces however look better medial and lateral. 7/1; the patient has been in contact with vein and vascular in [place] but has not yet received an appointment. Using Sorbact we have gradually improve the wound surface with no improvement in surface area. She is approved for Apligraf but the wound surface still is not completely viable. She has not had to come in for a dressing change 7/8; the patient has an appointment with vein and vascular on 7/31 which is a Friday afternoon. She is concerned about getting back here for [place] to dress her wounds. I think it is important to have them goal for her venous reflux/history of ablations etc. to see if anything else can be done. She  apparently tested positive for 1 of the blood tests with regards to lupus and saw a rheumatologist. He has raised the issue of vasculitis again. I have had this thought in the past however the evidence seems overwhelming that this is a venous reflux etiology. If the rheumatologist tells me there is clinical and laboratory investigation is positive for lupus I will rethink this. 7/15; the patient's wound surfaces are quite a bit better. The medial area which was her original wound now has no depth although the lateral wound which was the more recent area actually appears larger. Both with viable surfaces which is indeed better. Using Sorbact. I wanted to use Apligraf on her however there is the issue of the vein and vascular appointment on 7/31 at 2:00 in the afternoon  which would not allow her to get back to be rewrapped and they would no doubt remove the graft 7/22; the patient's wound surfaces have moderate amount of debris although generally look better. The lateral one is larger with 2 small satellite areas superiorly. We are waiting for her vein and vascular appointment on 7/31. She has been approved for Apligraf which I would like to use after th 7/29; wound surfaces have improved no debridement is required we have been using Sorbact. She sees vein and vascular on Friday with this so question of whether anything can be done to lessen the likelihood of recurrence and/or speed the healing of these areas. She is already had previous ablations. She no doubt has severe venous hypertension 8/5-Patient returns at 1 week, she was in [place] for 3 days by her podiatrist, we have been using so backed to the wound, she has increased pain in both the wounds on the left lower leg especially the more distal one on the lateral aspect 8/12-Patient returns at 1 week and she is agreeable to having debridement in both wounds on her left leg today. We have been using Sorbact, and vascular studies were reviewed  at last visit 8/19; the patient arrives with her wounds fairly clean and no debridement is required. We have used Sorbact which is really done a nice job in cleaning up these very difficult wound surfaces. The patient saw Dr. Donzetta Matters of vascular surgery on 7/31. He did not feel that there was an arterial component. He felt that her treated greater saphenous vein is adequately addressed and that the small saphenous vein did not appear to be involved significantly. She was also noted to have deep venous reflux which is not treatable. Dr. Donzetta Matters mentioned the possibility of a central obstructive component leading to reflux and he offered her central venography. She wanted to discuss this or think about it. I have urged her to go ahead with this. She has had recurrent difficult wounds in these areas which do heal but after months in the clinic. If there is anything that can be done to reduce the likelihood of this I think it is worth it. 9/2 she is still working towards getting follow-up with Dr. Donzetta Matters to schedule her CT. Things are quite a bit worse venography. I put Apligraf on 2 weeks ago on both wounds on the medial and lateral part of her left lower leg. She arrives in clinic today with 3 superficial additional wounds above the area laterally and one below the wound medially. She describes a lot of discomfort. I think these are probably wrapped injuries. Does not look like she has cellulitis. 07/20/2019 on evaluation today patient appears to be doing somewhat poorly in regard to her lower extremity ulcers. She in fact showed signs of erythema in fact we may even be dealing with an infection at this time. Unfortunately I am unsure if this is just infection or if indeed there may be some allergic reaction that occurred as a result of the Apligraf application. With that being said that would be unusual but nonetheless not impossible in this patient is one who is unfortunately allergic to quite a bit.  Currently we have been using the Sorbact which seems to do as well as anything for her. I do think we may want to obtain [name], [name] a culture today to see if there is anything showing up there that may need to be addressed. 9/16; noted that last week the wounds  look worse in 1 week follow-up of the Apligraf. Using Sorbact as of 2 days ago. She arrives with copious amounts of drainage and new skin breakdown on the back of the left calf. The wounds arm more substantial bilaterally. There is a fair amount of swelling in the left calf no overt DVT there is edema present I think in the left greater than right thigh. She is supposed to go on 9/28 for CT venography. The wounds on the medial and lateral calf are worse and she has new skin breakdown posteriorly at least new for me. This is almost developing into a circumferential wound area The Apligraf was taken off last week which I agree with things are not going in the right direction a culture was done we do not have that back yet. She is on Augmentin that she started 2 days ago 9/23; dressing was changed by her nurses on Monday. In general there is no improvement in the wound areas although the area looks less angry than last week. She did get Augmentin for MSSA cultured on the 14th. She still appears to have too much swelling in the left leg even with 3 layer compression 9/30; the patient underwent her procedure on 9/28 by Dr. Donzetta Matters at vascular and vein specialist. She was discovered to have the common iliac vein measuring 12.2 mm but at the level of L4-L5 measured 3 mm. After stenting it measured 10 mm. 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 Rouleaux flow in the common femoral and femoral vein was observed much improved after stenting. We are using silver alginate to the wounds on the medial and lateral ankle on the left. 4 layer compression 10/7; the patient had fluid swelling around her knee and 4 layer  compression. At the advice of vein and vascular this was reduced to 3 layer which she is tolerating better. We have been using silver alginate under 3 layer compression since last Friday 10/14; arrives with the areas on the left ankle looking a lot better. Inflammation in the area also a lot better. She came in for a nurse check on 10/9 10/21; continued nice improvement. Slight improvements in surface area of both the medial and lateral wounds on the left. A lot of the satellite lesions in the weeping erythema around these from stasis dermatitis is resolved. We have been using silver alginate 10/28; general improvement in the entire wound areas although not a lot of change in dimensions the wound certainly looks better. There is a lot less in terms of venous inflammation. Continue silver alginate this week however look towards Hydrofera Blue next week 11/4; very adherent debris on the medial wound left wound is not as bad. We have been using silver alginate. Change to [facility] today 11/11; very adherent debris on both wound areas. She went to vein and vascular last week and follow-up they put in [place] boot on this today. He says the [facility] was adherent. Wound is definitely not as good as last week. Especially on the left there the satellite lesions look more prominent 11/18; absolutely no better. erythema on lateral aspect with tenderness. 09/30/2019 on evaluation today patient appears to actually be doing better. Dr. Dellia Nims did put her on doxycycline last week which I do believe has helped her at this point. Fortunately there is no signs of active infection at this time. No fevers, chills, nausea, vomiting, or diarrhea. I do believe he may want extend the doxycycline for 7 additional days  just to ensure everything does completely cleared up the patient is in agreement with that plan. Otherwise she is going require some sharp debridement today 12/2; patient is completing a 2-week  course of doxycycline. I gave her this empirically for inflammation as well as infection when I last saw her 2 weeks ago. All of this seems to be better. She is using silver alginate she has the area on the medial aspect of the larger area laterally and the 2 small satellite regions laterally above the major wound. 12/9; the patient's wound on the left medial and left lateral calf look really quite good. We have been using silver alginate. She saw vein and vascular in follow-up on 10/09/2019. She has had a previous left greater saphenous vein ablation by Dr. Oscar La in 2016. More recently she underwent a left common iliac vein stent by Dr. 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 The swelling is improved and certainly the wounds have improved. The patient shows [place] today area on the right medial calf there is almost no wound but leaking lymphedema. She says she start this started 3 or 4 days ago. She did not traumatize it. It is not painful. She does not wear compression on that side 12/16; the patient continues to do well laterally. Medially still requiring debridement. The area on the right calf did not materialize to anything and is not currently open. We wrapped this last time. She has support stockings for that leg although I am not sure they are going to provide adequate compression 12/23; the lateral wound looks stable. Medially still requiring debridement for tightly adherent fibrinous debris. We've been using silver alginate. Surface area not any different 12/30; neither wound is any better with regards to surface and the area on the left lateral is larger. I been using silver alginate to the left lateral which look quite good last week and Sorbact to the left medial [name], [name] ([id number]) 11/11/2019. Lateral wound area actually looks better and somewhat smaller. Medial still requires a very aggressive debridement today. We have been using Sorbact on both wound areas 1/13; not  much better still adherent debris bilaterally. I been using Sorbact. She has severe venous hypertension. Probably some degree of dermal fibrosis distally. I wonder whether tighter compression might help and I am going to try that today. We also need to work on the bioburden 1/20; using Sorbact. She has severe venous hypertension status post stent placement for pelvic vein compression. We applied gentamicin last time to see if we could reduce bioburden I had some discussion with her today about the use of pentoxifylline. This is occasionally used in this setting for wounds with refractory venous insufficiency. However this interacts with Plavix. She tells me that she was put on this after stent placement for 3 months. She will call Dr. Claretha Cooper office to discuss Objective Constitutional Patient is hypertensive.Marland Kitchen Respirations regular, non-labored and within target range.. Temperature is normal and within the target range for the patient.Marland Kitchen appears in no distress. Vitals Time Taken: 12:47 PM, Height: 63 in, Weight: 224.7 lbs, BMI: 39.8, Temperature: 98.0 F, Pulse: 79 bpm, Respiratory Rate: 16 breaths/min, Blood Pressure: 156/96 mmHg. General Notes: Wound exam both areas on the lateral and medial ankle on the left about the same size. The area medially has a better surface today the area laterally requires debridement with an open curette. Removing adherent necrotic debris Integumentary (Hair, Skin) Wound #5 status is Open. Original cause of wound was Gradually Appeared.  The wound is located on the Left,Medial Lower Leg. The wound measures 2.8cm length x 2.4cm width x 0.2cm depth; 5.278cm^2 area and 1.056cm^3 volume. There is Fat Layer (Subcutaneous Tissue) Exposed exposed. There is no undermining noted. There is a large amount of serosanguineous drainage noted. The wound margin is flat and intact. There is small (1-33%) pale granulation within the wound bed. There is a large (67-100%) amount of  necrotic tissue within the wound bed including Adherent Slough. Wound #6 status is Open. Original cause of wound was Gradually Appeared. The wound is located on the Left,Lateral Lower Leg. The wound measures 4cm length x 2.5cm width x 0.3cm depth; 7.854cm^2 area and 2.356cm^3 volume. There is Fat Layer (Subcutaneous Tissue) Exposed exposed. There is no tunneling or undermining noted. There is a large amount of serosanguineous drainage noted. The wound margin is flat and intact. There is small (1-33%) pink granulation within the wound bed. There is a large (67-100%) amount of necrotic tissue within the wound bed including Adherent Slough. Wound #9 status is Open. Original cause of wound was Gradually Appeared. The wound is located on the Left,Lateral,Posterior Lower Leg. The wound measures 1cm length x 1cm width x 0.1cm depth; 0.785cm^2 area and 0.079cm^3 volume. There is Fat Layer (Subcutaneous Tissue) Exposed exposed. There is no tunneling or undermining noted. There is a large amount of serosanguineous drainage noted. The wound margin is flat and intact. There is small (1-33%) pink granulation within the wound bed. There is a large (67-100%) amount of necrotic tissue within the wound bed including Adherent Slough. OLEVA, KIPP ([id number]) Assessment Active Problems ICD-10 Non-pressure chronic ulcer of left calf limited to breakdown of skin Chronic venous hypertension (idiopathic) with inflammation of right lower extremity Lymphedema, not elsewhere classified Non-pressure chronic ulcer of other part of right lower leg with other specified severity Procedures Wound #6 Pre-procedure diagnosis of Wound #6 is a Venous Leg Ulcer located on the Left,Lateral Lower Leg .Severity of Tissue Pre Debridement is: Fat layer exposed. There was a Excisional Skin/Subcutaneous Tissue Debridement with a total area of 10 sq cm performed by Amber Dillon, MD. With the following instrument(s): Curette to  remove Viable and Non-Viable tissue/material. Material removed includes Subcutaneous Tissue, Slough, and Biofilm after achieving pain control using Lidocaine. No specimens were taken. A time out was conducted at 13:20, prior to the start of the procedure. A Minimum amount of bleeding was controlled with Pressure. The procedure was tolerated well. Post Debridement Measurements: 4cm length x 2.5cm width x 0.3cm depth; 2.356cm^3 volume. Character of Wound/Ulcer Post Debridement requires further debridement. Severity of Tissue Post Debridement is: Fat layer exposed. Post procedure Diagnosis Wound #6: Same as Pre-Procedure Plan Wound Cleansing: Wound #5 Left,Medial Lower Leg: Cleanse wound with mild soap and water Wound #6 Left,Lateral Lower Leg: Cleanse wound with mild soap and water Wound #9 Left,Lateral,Posterior Lower Leg: Cleanse wound with mild soap and water Anesthetic (add to Medication List): Wound #5 Left,Medial Lower Leg: Topical Lidocaine 4% cream applied to wound bed prior to debridement (In Clinic Only). Wound #6 Left,Lateral Lower Leg: Topical Lidocaine 4% cream applied to wound bed prior to debridement (In Clinic Only). Wound #9 Left,Lateral,Posterior Lower Leg: Topical Lidocaine 4% cream applied to wound bed prior to debridement (In Clinic Only). Skin Barriers/Peri-Wound Care: Wound #5 Left,Medial Lower Leg: Other: - Gentamycin Wound #6 Left,Lateral Lower Leg: Other: - Gentamycin Wound #9 Left,Lateral,Posterior Lower Leg: Other: - Gentamycin Primary Wound Dressing: HODA, POSTIER ([id number]) Wound #5 Left,Medial Lower  Leg: Other: - sorbact Wound #6 Left,Lateral Lower Leg: Other: - sorbact Wound #9 Left,Lateral,Posterior Lower Leg: Other: - sorbact Secondary Dressing: Wound #5 Left,Medial Lower Leg: Drawtex Wound #6 Left,Lateral Lower Leg: Drawtex Wound #9 Left,Lateral,Posterior Lower Leg: Drawtex Dressing Change Frequency: Wound #5 Left,Medial Lower  Leg: Change dressing every week Other: - as needed. Wound #6 Left,Lateral Lower Leg: Change dressing every week Other: - as needed. Wound #9 Left,Lateral,Posterior Lower Leg: Change dressing every week Other: - as needed. Follow-up Appointments: Wound #5 Left,Medial Lower Leg: Return Appointment in 1 week. Nurse Visit as needed - Call of needed Wound #6 Left,Lateral Lower Leg: Return Appointment in 1 week. Nurse Visit as needed - Call of needed Wound #9 Left,Lateral,Posterior Lower Leg: Return Appointment in 1 week. Nurse Visit as needed - Call of needed Edema Control: Wound #5 Left,Medial Lower Leg: 4-Layer Compression System - Left Lower Extremity. Wound #6 Left,Lateral Lower Leg: 4-Layer Compression System - Left Lower Extremity. Wound #9 Left,Lateral,Posterior Lower Leg: 4-Layer Compression System - Left Lower Extremity. 1. We continued with topical gentamicin covering Sorbact and 4-layer compression 2. Her edema control was excellent today. We cannot do better than this 3. I had some discussion about pentoxifylline however there is at least a theoretical interaction risk with Plavix. I am not going to risk that here. Electronic Signature(s) Signed: 12/02/2019 4:18:13 PM By: Amber Ham MD Entered By: [name] on 11/25/2019 13:37:35 Odwyer, [name] ([id number]) -------------------------------------------------------------------------------- SuperBill Details Patient Name: LIEZL, ZELADA. Date of Service: 11/25/2019 Medical Record Number: [id number] Patient Account Number: [id number] Date of Birth/Sex: 12/25/1945 (74 y.o. F) Treating [name]: [name] Primary Care Provider: Ria [name] Other Clinician: Referring Provider: Ria [name] Treating Provider/Extender: [name] in Treatment: 50 Diagnosis Coding ICD-10 Codes Code Description [phone] Non-pressure chronic ulcer of left calf limited to breakdown of skin I87.321 Chronic venous  hypertension (idiopathic) with inflammation of right lower extremity I89.0 Lymphedema, not elsewhere classified L97.818 Non-pressure chronic ulcer of other part of right lower leg with other specified severity Facility Procedures CPT4 Code: [id number] Description: [id number] - DEB SUBQ TISSUE 20 SQ CM/< ICD-10 Diagnosis Description L97.221 Non-pressure chronic ulcer of left calf limited to breakdown o Modifier: f skin Quantity: 1 Physician Procedures CPT4 Code: [id number] Description: [id number] - WC PHYS SUBQ TISS 20 SQ CM ICD-10 Diagnosis Description L97.221 Non-pressure chronic ulcer of left calf limited to breakdown o Modifier: f skin Quantity: 1 Electronic Signature(s) Signed: 12/02/2019 4:18:13 PM By: Amber Ham MD Entered By: [name] on 11/25/2019 13:38:02

## 2019-12-02 NOTE — Progress Notes (Signed)
STACEY, STAYNER ([id number]) Visit Report for 11/25/2019 Arrival Information Details Patient Name: [name], [name]. Date of Service: 11/25/2019 12:45 PM Medical Record Number: [id number] Patient Account Number: [id number] Date of Birth/Sex: 1946/10/16 (74 y.o. F) Treating RN: [name] Primary Care [name]: [name] Other Clinician: Referring [name]: [name] Treating [name]/Extender: [name] in Treatment: 51 Visit Information History Since Last Visit Added or deleted any medications: No Patient Arrived: Ambulatory Any new allergies or adverse reactions: No Arrival Time: 12:43 Had a fall or experienced change in No Accompanied By: self activities of daily living that may affect Transfer Assistance: None risk of falls: Patient Identification Verified: Yes Signs or symptoms of abuse/neglect since last visito No Secondary Verification Process Yes Hospitalized since last visit: No Completed: Implantable device outside of the clinic excluding No Patient Requires Transmission-Based No cellular tissue based products placed in the center Precautions: since last visit: Patient Has Alerts: Yes Has Dressing in Place as Prescribed: Yes Patient Alerts: Patient on Blood Has Compression in Place as Prescribed: Yes Thinner Pain Present Now: No aspirin 81 Electronic Signature(s) Signed: 11/25/2019 4:32:44 PM By: [name] RCP, RRT, CHT Entered By: [name] on 11/25/2019 12:48:40 [name] -------------------------------------------------------------------------------- Encounter Discharge Information Details Patient Name: [name], [name]. Date of Service: 11/25/2019 12:45 PM Medical Record Number: [id number] Patient Account Number: [id number] Date of Birth/Sex: 11-26-1945 (74 y.o. F) Treating RN: [name] Primary Care Lamiyah Schlotter: [name] Other Clinician: Referring [name]: [name] Treating [name]/Extender: [name] in Treatment: 41 Encounter Discharge Information Items Post Procedure Vitals Discharge Condition: Stable Temperature (F): 98.0 Ambulatory Status: Ambulatory Pulse (bpm): 79 Discharge Destination: Home Respiratory Rate (breaths/min): 16 Transportation: Private Auto Blood Pressure (mmHg): 156/69 Accompanied By: self Schedule Follow-up Appointment: Yes Clinical Summary of Care: Electronic Signature(s) Signed: 11/25/2019 3:29:52 PM By: [name] Entered By: [name] on 11/25/2019 13:25:05 [name] -------------------------------------------------------------------------------- Lower Extremity Assessment Details Patient Name: [name], [name]. Date of Service: 11/25/2019 12:45 PM Medical Record Number: [id number] Patient Account Number: [id number] Date of Birth/Sex: 12/09/1945 (74 y.o. F) Treating RN: [name] Primary Care Mercedes Valeriano: [name] Other Clinician: Referring [name]: [name] Treating [name]/Extender: [name] in Treatment: 50 Edema Assessment Assessed: [Left: No] [Right: No] Edema: [Left: N] [Right: o] Calf Left: Right: Point of Measurement: 33 cm From Medial Instep 42 cm cm Ankle Left: Right: Point of Measurement: 10 cm From Medial Instep 23 cm cm Vascular Assessment Pulses: Dorsalis Pedis Palpable: [Left:Yes] Electronic Signature(s) Signed: 11/25/2019 1:42:42 PM By: [name] Entered By: [name] on 11/25/2019 12:58:58 [name], [name] -------------------------------------------------------------------------------- Multi Wound Chart Details Patient Name: [name], [name]. Date of Service: 11/25/2019 12:45 PM Medical Record Number: [id number] Patient Account Number: [id number] Date of Birth/Sex: 07/10/1946 (74 y.o. F) Treating RN: [name] Primary Care [name]: [name] Other  Clinician: Referring [name]: [name] Treating [name]/Extender: [name] in Treatment: 50 Vital Signs Height(in): 63 Pulse(bpm): 58 Weight(lbs): 224.7 Blood Pressure(mmHg): 156/96 Body Mass Index(BMI): 40 Temperature(F): 98.0 Respiratory Rate 16 (breaths/min): Photos: Wound Location: Left Lower Leg - Medial Left Lower Leg - Lateral Left Lower Leg - Lateral, Posterior Wounding Event: Gradually Appeared Gradually Appeared Gradually Appeared Primary Etiology: Lymphedema Venous Leg Ulcer Venous Leg Ulcer Comorbid History: Cataracts, Asthma, Sleep Cataracts, Asthma, Sleep Cataracts, Asthma, Sleep Apnea, Deep Vein Apnea, Deep Vein Apnea, Deep Vein Thrombosis, Hypertension, Thrombosis, Hypertension, Thrombosis,  Hypertension, Peripheral Venous Disease, Peripheral Venous Disease, Peripheral Venous Disease, Osteoarthritis, Received Osteoarthritis, Received Osteoarthritis, Received Chemotherapy, Received Chemotherapy, Received Chemotherapy, Received Radiation Radiation Radiation Date Acquired: 11/19/2018 01/19/2019 07/08/2019 Weeks of Treatment: 50 44 20 Wound Status: Open Open Open Measurements L x W x D 2.8x2.4x0.2 4x2.5x0.3 1x1x0.1 (cm) Area (cm) : 5.278 7.854 0.785 Volume (cm) : 1.056 2.356 0.079 % Reduction in Area: 38.20% -3470.00% -38.90% % Reduction in Volume: -23.50% -10609.10% -38.60% Classification: Full Thickness Without Full Thickness Without Full Thickness Without Exposed Support Structures Exposed Support Structures Exposed Support Structures Exudate Amount: Large Large Large Exudate Type: Serosanguineous Serosanguineous Serosanguineous Exudate Color: red, brown red, brown red, brown Wound Margin: Flat and Intact Flat and Intact Flat and Intact Granulation Amount: Small (1-33%) Small (1-33%) Small (1-33%) Granulation Quality: Pale Pink Pink Necrotic Amount: Large (67-100%) Large (67-100%) Large (67-100%) [name], [name] ([id number]) Exposed  Structures: Fat Layer (Subcutaneous Fat Layer (Subcutaneous Fat Layer (Subcutaneous Tissue) Exposed: Yes Tissue) Exposed: Yes Tissue) Exposed: Yes Fascia: No Fascia: No Fascia: No Tendon: No Tendon: No Tendon: No Muscle: No Muscle: No Muscle: No Joint: No Joint: No Joint: No Bone: No Bone: No Bone: No Epithelialization: None None None Debridement: N/A Debridement - Excisional N/A Pre-procedure N/A 13:20 N/A Verification/Time Out Taken: Pain Control: N/A Lidocaine N/A Tissue Debrided: N/A Subcutaneous, Slough N/A Level: N/A Skin/Subcutaneous Tissue N/A Debridement Area (sq cm): N/A 10 N/A Instrument: N/A Curette N/A Bleeding: N/A Minimum N/A Hemostasis Achieved: N/A Pressure N/A Debridement Treatment N/A Procedure was tolerated well N/A Response: Post Debridement N/A 4x2.5x0.3 N/A Measurements L x W x D (cm) Post Debridement Volume: N/A 2.356 N/A (cm) Procedures Performed: N/A Debridement N/A Treatment Notes Wound #5 (Left, Medial Lower Leg) Notes Gentamycin, Sorbact to all wounds, drawtex, 3-Layer left, unna to anchor. TCA peri Wound #6 (Left, Lateral Lower Leg) Notes Gentamycin, Sorbact to all wounds, drawtex, 3-Layer left, unna to anchor. TCA peri Wound #9 (Left, Lateral, Posterior Lower Leg) Notes Gentamycin, Sorbact to all wounds, drawtex, 3-Layer left, unna to anchor. TCA peri Electronic Signature(s) Signed: 12/02/2019 4:18:13 PM By: Linton Ham MD Entered By: Linton Ham on 11/25/2019 13:32:10 [name], [name] -------------------------------------------------------------------------------- Multi-Disciplinary Care Plan Details Patient Name: [name], [name]. Date of Service: 11/25/2019 12:45 PM Medical Record Number: [id number] Patient Account Number: [id number] Date of Birth/Sex: Dec 08, 1945 (74 y.o. F) Treating RN: [name] Primary Care Palma Buster: [name] Other Clinician: Referring Aneliese Beaudry: [name] Treating  Dimitria Ketchum/Extender: [name] in Treatment: 3 Active Inactive Orientation to the Wound Care Program Nursing Diagnoses: Knowledge deficit related to the wound healing center program Goals: Patient/caregiver will verbalize understanding of the [facility] Program Date Initiated: 12/10/2018 Target Resolution Date: 01/09/2019 Goal Status: Active Interventions: Provide education on orientation to the wound center Notes: Soft Tissue Infection Nursing Diagnoses: Impaired tissue integrity Goals: Patient's soft tissue infection will resolve Date Initiated: 12/10/2018 Target Resolution Date: 01/09/2019 Goal Status: Active Interventions: Assess signs and symptoms of infection every visit Notes: Venous Leg Ulcer Nursing Diagnoses: Actual venous Insuffiency (use after diagnosis is confirmed) Goals: Patient will maintain optimal edema control Date Initiated: 12/10/2018 Target Resolution Date: 01/09/2019 Goal Status: Active Interventions: Assess peripheral edema status every visit. [name], [name] Treatment Activities: Therapeutic compression applied : 12/10/2018 Notes: Wound/Skin Impairment Nursing Diagnoses: Impaired tissue integrity Goals: Patient/caregiver will verbalize understanding of skin care regimen Date Initiated: 12/10/2018 Target Resolution Date: 01/09/2019 Goal Status: Active Interventions: Assess ulceration(s) every visit Treatment Activities: Topical wound management initiated : 12/10/2018  Notes: Engineer, maintenance) Signed: 11/25/2019 3:29:52 PM By: [name] Entered By: [name] on 11/25/2019 13:11:38 [name], [name] -------------------------------------------------------------------------------- Pain Assessment Details Patient Name: ZANDAYA, FOURNET. Date of Service: 11/25/2019 12:45 PM Medical Record Number: [id number] Patient Account Number: [id number] Date of Birth/Sex: 20-Dec-1945 (74  y.o. F) Treating RN: [name] Primary Care Atalie Oros: [name] Other Clinician: Referring Theodoros Stjames: [name] Treating Shetara Launer/Extender: [name] in Treatment: 50 Active Problems Location of Pain Severity and Description of Pain Patient Has Paino No Site Locations Pain Management and Medication Current Pain Management: Electronic Signature(s) Signed: 11/25/2019 3:29:52 PM By: [name] Signed: 11/25/2019 4:32:44 PM By: [name] RCP, RRT, CHT Entered By: [name] on 11/25/2019 12:48:57 [name] -------------------------------------------------------------------------------- Patient/Caregiver Education Details Patient Name: [name], [name]. Date of Service: 11/25/2019 12:45 PM Medical Record Number: [id number] Patient Account Number: [id number] Date of Birth/Gender: [birth year] (74 y.o. F) Treating RN: [name] Primary Care Physician: [name] Other Clinician: Referring Physician: Ria [name] Treating Physician/Extender: [name] in Treatment: 33 Education Assessment Education Provided To: Patient Education Topics Provided Venous: Handouts: Controlling Swelling with Multilayered Compression Wraps Methods: Demonstration, Explain/Verbal Responses: State content correctly Wound Debridement: Handouts: Wound Debridement Methods: Demonstration, Explain/Verbal Responses: State content correctly Electronic Signature(s) Signed: 11/25/2019 3:29:52 PM By: [name] Entered By: [name] on 11/25/2019 13:24:02 [name] -------------------------------------------------------------------------------- Wound Assessment Details Patient Name: [name], [name]. Date of Service: 11/25/2019 12:45 PM Medical Record Number: [id number] Patient Account Number: [id number] Date of Birth/Sex: [birth year] (74  y.o. F) Treating RN: [name] Primary Care Maydelin Deming: [name] Other Clinician: Referring Lowen Mansouri: [name] Treating Kaulder [name]/Extender: [name] in Treatment: 50 Wound Status Wound Number: 5 Primary Lymphedema Etiology: Wound Location: Left Lower Leg - Medial Wound Open Wounding Event: Gradually Appeared Status: Date Acquired: 11/19/2018 Comorbid Cataracts, Asthma, Sleep Apnea, Deep Vein Weeks Of Treatment: 50 History: Thrombosis, Hypertension, Peripheral Venous Clustered Wound: No Disease, Osteoarthritis, Received Chemotherapy, Received Radiation Photos Wound Measurements Length: (cm) 2.8 Width: (cm) 2.4 Depth: (cm) 0.2 Area: (cm) 5.278 Volume: (cm) 1.056 % Reduction in Area: 38.2% % Reduction in Volume: -23.5% Epithelialization: None Undermining: No Wound Description Full Thickness Without Exposed Support Classification: Structures Wound Margin: Flat and Intact Exudate Large Amount: Exudate Type: Serosanguineous Exudate Color: red, brown Foul Odor After Cleansing: No Slough/Fibrino Yes Wound Bed Granulation Amount: Small (1-33%) Exposed Structure Granulation Quality: Pale Fascia Exposed: No Necrotic Amount: Large (67-100%) Fat Layer (Subcutaneous Tissue) Exposed: Yes Necrotic Quality: Adherent Slough Tendon Exposed: No Muscle Exposed: No Joint Exposed: No Bone Exposed: No [name], [name] Electronic Signature(s) Signed: 11/25/2019 1:42:42 PM By: [name] Entered By: [name] on 11/25/2019 12:56:18 Huezo, [name] -------------------------------------------------------------------------------- Wound Assessment Details Patient Name: [name], [name]. Date of Service: 11/25/2019 12:45 PM Medical Record Number: [id number] Patient Account Number: [id number] Date of Birth/Sex: 1946/11/05 (74 y.o. F) Treating RN: [name] Primary Care Gertha Lichtenberg: [name] Other  Clinician: Referring Cerria Randhawa: [name] Treating Aston Lawhorn/Extender: [name] in Treatment: 50 Wound Status Wound Number: 6 Primary Venous Leg Ulcer Etiology: Wound Location: Left Lower Leg - Lateral Wound Open Wounding Event: Gradually Appeared Status: Date Acquired: 01/19/2019 Comorbid Cataracts, Asthma, Sleep Apnea, Deep Vein Weeks Of Treatment: 44 History: Thrombosis, Hypertension, Peripheral Venous Clustered Wound: No Disease, Osteoarthritis, Received Chemotherapy, Received  Radiation Photos Wound Measurements Length: (cm) 4 Width: (cm) 2.5 Depth: (cm) 0.3 Area: (cm) 7.854 Volume: (cm) 2.356 % Reduction in Area: -3470% % Reduction in Volume: -10609.1% Epithelialization: None Tunneling: No Undermining: No Wound Description Full Thickness Without Exposed Support Classification: Structures Wound Margin: Flat and Intact Exudate Large Amount: Exudate Type: Serosanguineous Exudate Color: red, brown Foul Odor After Cleansing: No Slough/Fibrino Yes Wound Bed Granulation Amount: Small (1-33%) Exposed Structure Granulation Quality: Pink Fascia Exposed: No Necrotic Amount: Large (67-100%) Fat Layer (Subcutaneous Tissue) Exposed: Yes Necrotic Quality: Adherent Slough Tendon Exposed: No Muscle Exposed: No Joint Exposed: No Bone Exposed: No HALIMAH, BRAFFORD ([id number]) Electronic Signature(s) Signed: 11/25/2019 1:42:42 PM By: [name] Entered By: [name] on 11/25/2019 12:56:51 Diffee, [name] -------------------------------------------------------------------------------- Wound Assessment Details Patient Name: NESA, SZOSTEK. Date of Service: 11/25/2019 12:45 PM Medical Record Number: [id number] Patient Account Number: [id number] Date of Birth/Sex: 01/09/1946 (75 y.o. F) Treating RN: [name] Primary Care Linden Tagliaferro: [name] Other Clinician: Referring Lavi Sheehan: [name] Treating Asuzena Weis/Extender:  [name] in Treatment: 50 Wound Status Wound Number: 9 Primary Venous Leg Ulcer Etiology: Wound Location: Left Lower Leg - Lateral, Posterior Wound Open Wounding Event: Gradually Appeared Status: Date Acquired: 07/08/2019 Comorbid Cataracts, Asthma, Sleep Apnea, Deep Vein Weeks Of Treatment: 20 History: Thrombosis, Hypertension, Peripheral Venous Clustered Wound: No Disease, Osteoarthritis, Received Chemotherapy, Received Radiation Photos Wound Measurements Length: (cm) 1 Width: (cm) 1 Depth: (cm) 0.1 Area: (cm) 0.785 Volume: (cm) 0.079 % Reduction in Area: -38.9% % Reduction in Volume: -38.6% Epithelialization: None Tunneling: No Undermining: No Wound Description Full Thickness Without Exposed Support Classification: Structures Wound Margin: Flat and Intact Exudate Large Amount: Exudate Type: Serosanguineous Exudate Color: red, brown Foul Odor After Cleansing: No Slough/Fibrino Yes Wound Bed Granulation Amount: Small (1-33%) Exposed Structure Granulation Quality: Pink Fascia Exposed: No Necrotic Amount: Large (67-100%) Fat Layer (Subcutaneous Tissue) Exposed: Yes Necrotic Quality: Adherent Slough Tendon Exposed: No Muscle Exposed: No Joint Exposed: No Bone Exposed: No CHENG, TAVELLA ([id number]) Electronic Signature(s) Signed: 11/25/2019 1:42:42 PM By: [name] Entered By: [name] on 11/25/2019 12:58:12 Inglett, [name] -------------------------------------------------------------------------------- Vitals Details Patient Name: MAKAELYNN, HERSMAN. Date of Service: 11/25/2019 12:45 PM Medical Record Number: [id number] Patient Account Number: [id number] Date of Birth/Sex: Dec 27, 1945 (74 y.o. F) Treating RN: [name] Primary Care Io Dieujuste: [name] Other Clinician: Referring Torre Schaumburg: [name] Treating Marshayla Mitschke/Extender: [name] in Treatment: 50 Vital Signs Time Taken: 12:47 Temperature  (F): 98.0 Height (in): 63 Pulse (bpm): 79 Weight (lbs): 224.7 Respiratory Rate (breaths/min): 16 Body Mass Index (BMI): 39.8 Blood Pressure (mmHg): 156/96 Reference Range: 80 - 120 mg / dl Electronic Signature(s) Signed: 11/25/2019 4:32:44 PM By: [name] RCP, RRT, CHT Entered By: [name] on 11/25/2019 12:49:31

## 2019-12-07 ENCOUNTER — Other Ambulatory Visit: Payer: Self-pay

## 2019-12-07 ENCOUNTER — Encounter: Payer: Medicare Other | Attending: Physician Assistant

## 2019-12-07 DIAGNOSIS — J45909 Unspecified asthma, uncomplicated: Secondary | ICD-10-CM | POA: Insufficient documentation

## 2019-12-07 DIAGNOSIS — I10 Essential (primary) hypertension: Secondary | ICD-10-CM | POA: Diagnosis not present

## 2019-12-07 DIAGNOSIS — M1990 Unspecified osteoarthritis, unspecified site: Secondary | ICD-10-CM | POA: Diagnosis not present

## 2019-12-07 DIAGNOSIS — L97221 Non-pressure chronic ulcer of left calf limited to breakdown of skin: Secondary | ICD-10-CM | POA: Insufficient documentation

## 2019-12-07 DIAGNOSIS — G4733 Obstructive sleep apnea (adult) (pediatric): Secondary | ICD-10-CM | POA: Diagnosis not present

## 2019-12-07 DIAGNOSIS — G629 Polyneuropathy, unspecified: Secondary | ICD-10-CM | POA: Insufficient documentation

## 2019-12-07 DIAGNOSIS — I87332 Chronic venous hypertension (idiopathic) with ulcer and inflammation of left lower extremity: Secondary | ICD-10-CM | POA: Insufficient documentation

## 2019-12-07 DIAGNOSIS — Z7902 Long term (current) use of antithrombotics/antiplatelets: Secondary | ICD-10-CM | POA: Insufficient documentation

## 2019-12-07 DIAGNOSIS — M329 Systemic lupus erythematosus, unspecified: Secondary | ICD-10-CM | POA: Diagnosis not present

## 2019-12-07 DIAGNOSIS — I890 Lymphedema, not elsewhere classified: Secondary | ICD-10-CM | POA: Insufficient documentation

## 2019-12-07 DIAGNOSIS — Z882 Allergy status to sulfonamides status: Secondary | ICD-10-CM | POA: Diagnosis not present

## 2019-12-07 DIAGNOSIS — I872 Venous insufficiency (chronic) (peripheral): Secondary | ICD-10-CM | POA: Insufficient documentation

## 2019-12-07 NOTE — Progress Notes (Signed)
[name], [name] Visit Report for 12/07/2019 Arrival Information Details Patient Name: [name]. Date of Service: 12/07/2019 2:30 PM Medical Record Number: [id number] Patient Account Number: [id number] Date of Birth/Sex: 1945-11-28 (74 y.o. F) Treating RN: Army Melia Primary Care Abdulaziz Toman: [name] Other Clinician: Referring Janessa Mickle: [name] Treating Surafel Hilleary/Extender: Melburn Hake, HOYT Weeks in Treatment: 6 Visit Information History Since Last Visit Added or deleted any medications: No Patient Arrived: Ambulatory Any new allergies or adverse reactions: No Arrival Time: 14:34 Had a fall or experienced change in No Accompanied By: self activities of daily living that may affect Transfer Assistance: None risk of falls: Patient Identification Verified: Yes Signs or symptoms of abuse/neglect since last visito No Secondary Verification Process Yes Hospitalized since last visit: No Completed: Implantable device outside of the clinic excluding No Patient Requires Transmission-Based No cellular tissue based products placed in the center Precautions: since last visit: Patient Has Alerts: Yes Has Dressing in Place as Prescribed: Yes Patient Alerts: Patient on Blood Has Compression in Place as Prescribed: Yes Thinner Pain Present Now: No aspirin 81 Notes 98.7 Electronic Signature(s) Signed: 12/07/2019 3:53:08 PM By: Army Melia Entered By: Army Melia on 12/07/2019 14:41:44 Reppond, Karina Lenna Sciara ([id number]) -------------------------------------------------------------------------------- Compression Therapy Details Patient Name: [name]. Date of Service: 12/07/2019 2:30 PM Medical Record Number: [id number] Patient Account Number: [id number] Date of Birth/Sex: April 08, 1946 (74 y.o. F) Treating RN: Army Melia Primary Care Juanjose Mojica: [name] Other Clinician: Referring Amberly Livas: [name] Treating Myka Hitz/Extender: STONE III,  HOYT Weeks in Treatment: 51 Compression Therapy Performed for Wound Assessment: Wound #5 Left,Medial Lower Leg Performed By: Clinician Army Melia, RN Compression Type: Four Layer Pre Treatment ABI: 1 Electronic Signature(s) Signed: 12/07/2019 3:53:08 PM By: Army Melia Entered By: Army Melia on 12/07/2019 14:41:34 Takach, [name] -------------------------------------------------------------------------------- Compression Therapy Details Patient Name: [name]. Date of Service: 12/07/2019 2:30 PM Medical Record Number: [id number] Patient Account Number: [id number] Date of Birth/Sex: 04-30-1946 (74 y.o. F) Treating RN: Army Melia Primary Care Mccauley Diehl: [name] Other Clinician: Referring Wen Merced: [name] Treating Mazy Culton/Extender: STONE III, HOYT Weeks in Treatment: 51 Compression Therapy Performed for Wound Assessment: Wound #6 Left,Lateral Lower Leg Performed By: Clinician Army Melia, RN Compression Type: Four Layer Pre Treatment ABI: 1 Electronic Signature(s) Signed: 12/07/2019 3:53:08 PM By: Army Melia Entered By: Army Melia on 12/07/2019 14:41:34 Antony, [name] -------------------------------------------------------------------------------- Compression Therapy Details Patient Name: [name]. Date of Service: 12/07/2019 2:30 PM Medical Record Number: [id number] Patient Account Number: [id number] Date of Birth/Sex: Apr 09, 1946 (74 y.o. F) Treating RN: Army Melia Primary Care Darick Fetters: [name] Other Clinician: Referring Alveria Mcglaughlin: [name] Treating Annaliese Saez/Extender: STONE III, HOYT Weeks in Treatment: 51 Compression Therapy Performed for Wound Assessment: Wound #9 Left,Lateral,Posterior Lower Leg Performed By: Clinician Army Melia, RN Compression Type: Four Layer Pre Treatment ABI: 1 Electronic Signature(s) Signed: 12/07/2019 3:53:08 PM By: Army Melia Entered By: Army Melia on 12/07/2019  14:41:34 Gola, [name] -------------------------------------------------------------------------------- Encounter Discharge Information Details Patient Name: [name]. Date of Service: 12/07/2019 2:30 PM Medical Record Number: [id number] Patient Account Number: [id number] Date of Birth/Sex: January 25, 1946 (74 y.o. F) Treating RN: Army Melia Primary Care Denika [name]: [name] Other Clinician: Referring Kian Ottaviano: [name] Treating Meryl Ponder/Extender: Melburn Hake, HOYT Weeks in Treatment: 31 Encounter Discharge Information Items Discharge Condition: Stable Ambulatory Status: Ambulatory Discharge Destination: Home Transportation: Private Auto Accompanied By: self Schedule Follow-up Appointment: No Clinical Summary of Care: Electronic Signature(s) Signed: 12/07/2019 3:53:08 PM By: Army Melia Entered  By: Army Melia on 12/07/2019 14:42:44 Sarabia, [name] -------------------------------------------------------------------------------- Wound Assessment Details Patient Name: [name]. Date of Service: 12/07/2019 2:30 PM Medical Record Number: [id number] Patient Account Number: [id number] Date of Birth/Sex: 29-Mar-1946 (74 y.o. F) Treating RN: Army Melia Primary Care Carlo Lorson: [name] Other Clinician: Referring Pearlie Nies: [name] Treating Lakin Rhine/Extender: STONE III, HOYT Weeks in Treatment: 51 Wound Status Wound Number: 5 Primary Lymphedema Etiology: Wound Location: Left Lower Leg - Medial Wound Open Wounding Event: Gradually Appeared Status: Date Acquired: 11/19/2018 Comorbid Cataracts, Asthma, Sleep Apnea, Deep Vein Weeks Of Treatment: 51 History: Thrombosis, Hypertension, Peripheral Venous Clustered Wound: No Disease, Osteoarthritis, Received Chemotherapy, Received Radiation Wound Measurements Length: (cm) 2 Width: (cm) 2.2 Depth: (cm) 0.2 Area: (cm) 3.456 Volume: (cm) 0.691 % Reduction in Area:  59.6% % Reduction in Volume: 19.2% Epithelialization: None Tunneling: No Undermining: No Wound Description Full Thickness Without Exposed Support Classification: Structures Wound Margin: Flat and Intact Exudate Large Amount: Exudate Type: Serosanguineous Exudate Color: red, brown Foul Odor After Cleansing: No Slough/Fibrino Yes Wound Bed Granulation Amount: Small (1-33%) Exposed Structure Granulation Quality: Pale Fascia Exposed: No Necrotic Amount: Large (67-100%) Fat Layer (Subcutaneous Tissue) Exposed: Yes Necrotic Quality: Adherent Slough Tendon Exposed: No Muscle Exposed: No Joint Exposed: No Bone Exposed: No Treatment Notes Wound #5 (Left, Medial Lower Leg) Notes gentamicin cream, sorbact, drawtex, 4 layer wrap wiuth unna to anchor Electronic Signature(s) Signed: 12/07/2019 3:53:08 PM By: Army Melia Entered By: Army Melia on 12/07/2019 14:40:35 Cardinal, [name] Lardner, [name] -------------------------------------------------------------------------------- Wound Assessment Details Patient Name: OTELLA, HETZ. Date of Service: 12/07/2019 2:30 PM Medical Record Number: [id number] Patient Account Number: [id number] Date of Birth/Sex: 1946-09-09 (74 y.o. F) Treating RN: Army Melia Primary Care Lyn Deemer: [name] Other Clinician: Referring Hrishikesh Hoeg: [name] Treating Landy Dunnavant/Extender: STONE III, HOYT Weeks in Treatment: 51 Wound Status Wound Number: 6 Primary Venous Leg Ulcer Etiology: Wound Location: Left Lower Leg - Lateral Wound Open Wounding Event: Gradually Appeared Status: Date Acquired: 01/19/2019 Comorbid Cataracts, Asthma, Sleep Apnea, Deep Vein Weeks Of Treatment: 46 History: Thrombosis, Hypertension, Peripheral Venous Clustered Wound: No Disease, Osteoarthritis, Received Chemotherapy, Received Radiation Wound Measurements Length: (cm) 5 Width: (cm) 2.5 Depth: (cm) 0.3 Area: (cm) 9.817 Volume:  (cm) 2.945 % Reduction in Area: -4362.3% % Reduction in Volume: -13286.4% Epithelialization: None Tunneling: No Undermining: No Wound Description Full Thickness Without Exposed Support Classification: Structures Wound Margin: Flat and Intact Exudate Large Amount: Exudate Type: Serosanguineous Exudate Color: red, brown Foul Odor After Cleansing: No Slough/Fibrino Yes Wound Bed Granulation Amount: Small (1-33%) Exposed Structure Granulation Quality: Pink Fascia Exposed: No Necrotic Amount: Large (67-100%) Fat Layer (Subcutaneous Tissue) Exposed: Yes Necrotic Quality: Adherent Slough Tendon Exposed: No Muscle Exposed: No Joint Exposed: No Bone Exposed: No Treatment Notes Wound #6 (Left, Lateral Lower Leg) Notes gentamicin cream, sorbact, drawtex, 4 layer wrap wiuth unna to anchor Electronic Signature(s) Signed: 12/07/2019 3:53:08 PM By: Army Melia Entered By: Army Melia on 12/07/2019 14:40:55 Kindt, [name] Yniguez, [name] -------------------------------------------------------------------------------- Wound Assessment Details Patient Name: LOREDA, KORALEWSKI. Date of Service: 12/07/2019 2:30 PM Medical Record Number: [id number] Patient Account Number: [id number] Date of Birth/Sex: Apr 28, 1946 (74 y.o. F) Treating RN: Army Melia Primary Care Omni Dunsworth: [name] Other Clinician: Referring Armanda Forand: [name] Treating Taesha Goodell/Extender: STONE III, HOYT Weeks in Treatment: 51 Wound Status Wound Number: 9 Primary Venous Leg Ulcer Etiology: Wound Location: Left Lower Leg - Lateral, Posterior Wound Open Wounding Event: Gradually Appeared Status: Date Acquired: 07/08/2019  Comorbid Cataracts, Asthma, Sleep Apnea, Deep Vein Weeks Of Treatment: 21 History: Thrombosis, Hypertension, Peripheral Venous Clustered Wound: No Disease, Osteoarthritis, Received Chemotherapy, Received Radiation Wound Measurements Length: (cm) 1 Width:  (cm) 0.8 Depth: (cm) 0.1 Area: (cm) 0.628 Volume: (cm) 0.063 % Reduction in Area: -11.2% % Reduction in Volume: -10.5% Epithelialization: None Tunneling: No Undermining: No Wound Description Full Thickness Without Exposed Support Classification: Structures Wound Margin: Flat and Intact Exudate Large Amount: Exudate Type: Serosanguineous Exudate Color: red, brown Foul Odor After Cleansing: No Slough/Fibrino Yes Wound Bed Granulation Amount: Small (1-33%) Exposed Structure Granulation Quality: Pink Fascia Exposed: No Necrotic Amount: Large (67-100%) Fat Layer (Subcutaneous Tissue) Exposed: Yes Necrotic Quality: Adherent Slough Tendon Exposed: No Muscle Exposed: No Joint Exposed: No Bone Exposed: No Treatment Notes Wound #9 (Left, Lateral, Posterior Lower Leg) Notes gentamicin cream, sorbact, drawtex, 4 layer wrap wiuth unna to Barista Signature(s) Signed: 12/07/2019 3:53:08 PM By: Army Melia Entered By: Army Melia on 12/07/2019 14:41:10

## 2019-12-09 ENCOUNTER — Encounter: Payer: Medicare Other | Admitting: Internal Medicine

## 2019-12-09 ENCOUNTER — Other Ambulatory Visit: Payer: Self-pay

## 2019-12-09 DIAGNOSIS — I890 Lymphedema, not elsewhere classified: Secondary | ICD-10-CM | POA: Diagnosis not present

## 2019-12-09 DIAGNOSIS — I10 Essential (primary) hypertension: Secondary | ICD-10-CM | POA: Diagnosis not present

## 2019-12-09 DIAGNOSIS — I87312 Chronic venous hypertension (idiopathic) with ulcer of left lower extremity: Secondary | ICD-10-CM | POA: Diagnosis not present

## 2019-12-09 DIAGNOSIS — I872 Venous insufficiency (chronic) (peripheral): Secondary | ICD-10-CM | POA: Diagnosis not present

## 2019-12-09 DIAGNOSIS — L97222 Non-pressure chronic ulcer of left calf with fat layer exposed: Secondary | ICD-10-CM | POA: Diagnosis not present

## 2019-12-09 DIAGNOSIS — I87332 Chronic venous hypertension (idiopathic) with ulcer and inflammation of left lower extremity: Secondary | ICD-10-CM | POA: Diagnosis not present

## 2019-12-09 DIAGNOSIS — L97221 Non-pressure chronic ulcer of left calf limited to breakdown of skin: Secondary | ICD-10-CM | POA: Diagnosis not present

## 2019-12-09 DIAGNOSIS — L97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d: Secondary | ICD-10-CM | POA: Diagnosis not present

## 2019-12-09 DIAGNOSIS — J45909 Unspecified asthma, uncomplicated: Secondary | ICD-10-CM | POA: Diagnosis not present

## 2019-12-10 NOTE — Progress Notes (Signed)
NITOSHA, SCHMITZ ([id number]) Visit Report for 12/09/2019 HPI Details Patient Name: [name], [name]. Date of Service: 12/09/2019 2:00 PM Medical Record Number: [id number] Patient Account Number: [id number] Date of Birth/Sex: 1946/01/04 (74 y.o. F) Treating RN: Cornell Barman Primary Care Provider: [name] Other Clinician: Referring Provider: [name] Treating Provider/Extender: Tito Dine in Treatment: 66 History of Present Illness HPI Description: Pleasant 74 year old with history of chronic venous insufficiency. No diabetes or peripheral vascular disease. Left ABI 1.29. Questionable history of left lower extremity DVT. She developed a recurrent ulceration on her left lateral calf in December 2015, which she attributes to poor diet and subsequent lower extremity edema. She underwent endovenous laser ablation of her left greater saphenous vein in 2010. She underwent laser ablation of accessory branch of left GSV in April 2016 by Dr. Kellie Simmering at [facility]. She was previously wearing Unna boots, which she tolerated well. Tolerating 2 layer compression and cadexomer iodine. She returns to clinic for follow-up and is without new complaints. She denies any significant pain at this time. She reports persistent pain with pressure. No claudication or ischemic rest pain. No fever or chills. No drainage. READMISSION 11/13/16; this is a 74 year old woman who is not a diabetic. She is here for a review of a painful area on her left medial lower extremity. I note that she was seen here previously last year for wound I believe to be in the same area. At that time she had undergone previously a left greater saphenous vein ablation by Dr. Kellie Simmering and she had a ablation of the anterior accessory branch of the left greater saphenous vein in March 2016. Seeing that the wound actually closed over. In reviewing the history with her today the ulcer in this area has been recurrent. She describes  a biopsy of this area in 2009 that only showed stasis physiology. 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 Do not see that she has ever had arterial studies in the left leg. She tells me she has a follow-up with Dr. Kellie Simmering in roughly 10 days In any case she developed the reopening of this area roughly a month ago. On the background of this she describes rapidly increasing edema which has responded to Lasix 40 mg and metolazone 2.5 mg as well as the patient's lymph massage. She has been told she has both venous insufficiency and lymphedema but she cannot tolerate compression stockings 11/28/16; the patient saw Dr. Kellie Simmering recently. Per the patient he did arterial Dopplers in the office that did not show evidence of arterial insufficiency, per the patient he stated "treat this like an ordinary venous ulcer". She also saw her dermatologist Dr. Ronnald Ramp who felt that this was more of a vascular ulcer. In general things are improving although she arrives today with increasing bilateral lower extremity edema with weeping a deeper fluid through the wound on the left medial leg compatible with some degree of lymphedema 12/04/16; the patient's wound is fully epithelialized but I don't think fully healed. We will do another week of depression with Promogran and TCA however I suspect we'll be able to discharge her next week. This is a very unusual-looking wound which was initially a figure-of-eight type wound lying on its side surrounded by petechial like hemorrhage. She  has had venous ablation on this side. She apparently does not have an arterial issue per Dr. Kellie Simmering. She saw her dermatologist thought it was "vascular". Patient is definitely going to need ongoing compression and I talked about  this with her today she will go to elastic therapy after she leaves here next week 12/11/16; the patient's wound is not completely closed today. She has surrounding scar tissue and in further discussion with the patient it would appear that she had ulcers in this area in 2009 for a prolonged period of time ultimately requiring a punch biopsy of this area that only showed venous insufficiency. I did not previously pickup on this part of the history from the patient. 12/18/16; the patient's wound is completely epithelialized. There is no open area here. She has significant bilateral venous insufficiency with secondary lymphedema to a mild-to-moderate degree she does not have compression stockings.. She did not say anything to me when I was in the room, she told our intake nurse that she was still having pain in this area. This isn't [name], [name] unusual recurrent small open area. She is going to go to elastic therapy to obtain compression stockings. 12/25/16; the patient's wound is fully epithelialized. There is no open area here. The patient describes some continued episodic discomfort in this area medial left calf. However everything looks fine and healed here. She is been to elastic therapy and caught herself 15-20 mmHg stockings, they apparently were having trouble getting 20-30 mm stockings in her size 01/22/17; this is a patient we discharged from the clinic a month ago. She has a recurrent open wound on her medial left calf. She had 15 mm support stockings. I told her I thought she needed 20-30 mm compression stockings. She tells me that she has been ill with hospitalization secondary to asthma and is been found to have severe hypokalemia likely secondary to a combination of Lasix and metolazone. This morning she noted blistering and leaking fluid on the posterior part of her left leg. She called our intake nurse urgently and we was saw her this afternoon. She has not had any real  discomfort here. I don't know that she's been wearing any stockings on this leg for at least 2-3 days. ABIs in this clinic were 1.21 on the right and 1.3 on the left. She is previously seen vascular surgery who does not think that there is a peripheral arterial issue. 01/30/17; Patient arrives with no open wound on the left leg. She has been to elastic therapy and obtained 20-2mmhg below knee stockings and she has one on the right leg today. READMISSION 02/19/18; this Ririe is a now 74 year old patient we've had in this clinic perhaps 3 times before. I had last looked at her from January 07 December 2016 with an area on the medial left leg. We discharged her on 12/25/16 however she had to be readmitted on 01/22/17 with a recurrence. I have in my notes that we discharged her on 20-30 mm stockings although she tells me she was only wearing support hose because she cannot get stockings on predominantly related to her cervical spine surgery/issues. She has had previous ablations done by vein and vascular in [place] including a great saphenous vein ablation on the left with an anterior accessory branch ablation I think both of these were in 2016. On one of the previous visit she had a biopsy noted 2009 that was negative. She is not felt to have an arterial issue. She is not  a diabetic. She does have a history of obstructive sleep apnea hypertension asthma as well as chronic venous insufficiency and lymphedema. On this occasion she noted 2 dry scaly patch on her left leg. She tried to put lotion on this it didn't really help. There were 2 open areas.the patient has been seeing her primary physician from 02/05/18 through 02/14/18. She had Unna boots applied. The superior wound now on the lateral left leg has closed but she's had one wound that remains open on the lateral left leg. This is not the same spot as we dealt with in 2018. ABIs in this clinic were 1.3 bilaterally 02/26/18; patient has a small wound  on the left lateral calf. Dimensions are down. She has chronic venous insufficiency and lymphedema. 03/05/18; small open area on the left lateral calf. Dimensions are down. Tightly adherent necrotic debris over the surface of the wound which was difficult to remove. Also the dressing [over collagen] stuck to the wound surface. This was removed with some difficulty as well. Change the primary dressing to Hydrofera Blue ready 03/12/18; small open area on the left lateral calf. Comes in with tightly adherent surface eschar as well as some adherent Hydrofera Blue. 03/19/18; open area on the left lateral calf. Again adherent surface eschar as well as some adherent Hydrofera Blue nonviable subcutaneous tissue. She complained of pain all week even with the reduction from 4-3 layer compression I put on last week. Also she had an increase in her ankle and calf measurements probably related to the same thing. 03/26/18; open area on the left lateral calf. A very small open area remains here. We used silver alginate starting last week as the Hydrofera Blue seem to stick to the wound bed. In using 4-layer compression 04/02/18; the open area in the left lateral calf at some adherent slough which I removed there is no open area here. We are able to transition her into her own compression stocking. Truthfully I think this is probably his support hose. However this does not maintain skin integrity will be limited. She cannot put over the toe compression stockings on because of neck problems hand problems etc. She is allergic to the lining layer of juxta lites. We might be forced to use extremitease stocking should this fail READMIT 11/24/2018 Patient is now a 75 year old woman who is not a diabetic. She has been in this clinic on at least 3 previous occasions largely with recurrent wounds on her left leg secondary to chronic venous insufficiency with secondary lymphedema. Her situation is complicated by inability to get  stockings on and an allergy to neoprene which is apparently a component and at least juxta lites and other stockings. As a result she really has not been wearing any stockings on her legs. She tells [place] that roughly 2 or 3 weeks ago she started noticing a stinging sensation just above her ankle on the left medial aspect. She has been diagnosed with pseudogout and she wondered whether this was what she was experiencing. She tried to dress this with something she bought at the store however subsequently it pulled skin off and now she has an open wound that is not improving. She has been using Vaseline gauze with a cover bandage. She saw her primary doctor last week who [name], [name] put an Unna boot on her. ABIs in this clinic was 1.03 on the left 2/12; the area is on the left medial ankle. Odd-looking wound with what looks to be surface epithelialization  but a multitude of small petechial openings. This clearly not closed yet. We have been using silver alginate under 3 layer compression with TCA 2/19; the wound area did not look quite as good this week. Necrotic debris over the majority of the wound surface which required debridement. She continues to have a multitude of what looked to be small petechial openings. She reminds [place] that she had a biopsy on this initially during her first outbreak in 2015 in [facility]. She expresses concern about this being a possible melanoma. 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 I assured her that this does not look anything like melanoma. Except for the petechial reaction it does look like a venous insufficiency area and she certainly has evidence of this on both sides 2/26; a difficult area on the left medial ankle. The patient clearly has chronic venous hypertension with some degree of lymphedema. The odd thing about the area is the small  petechial hemorrhages. I am not really sure how to explain this. This was present last time and this is not a compression injury. We have been using Hydrofera Blue which I changed to last week 3/4; still using Hydrofera Blue. Aggressive debridement today. She does not have known arterial issues. She has seen Dr. Kellie Simmering at [facility] vein [facility] vascular and and has an ablation on the left. [Anterior accessory branch of the greater saphenous]. From what I remember they did not feel she had an arterial issue. The patient has had this area biopsied in 2009 at [facility] dermatology and by her recollection they said this was "stasis". She is also follow-up with dermatology locally who thought that this was more of a vascular issue 3/11; using Hydrofera Blue. Aggressive debridement today. She does not have an arterial issue. We are using 3 layer compression although we may need to go to 4. The patient has been in for multiple changes to her wrap since I last saw her a week ago. She says that the area was leaking. I do not have too much more information on what was found 01/19/19 on evaluation today patient was actually being seen for a nurse visit when unfortunately she had the area on her left lateral lower extremity as well as weeping from the right lower extremity that became apparent. Therefore we did end up actually seeing her for a full visit with myself. She is having some pain at this site as well but fortunately nothing too significant at this point. No fevers, chills, nausea, or vomiting noted at this time. 3/18-Patient is back to the clinic with the left leg venous leg ulcer, the ulcer is larger in size, has a surface that is densely adherent with fibrinous tissue, the Hydrofera Blue was used but is densely adherent and there was difficulty in removing it. The right lower extremity was also wrapped for weeping edema. Patient has a new area over the left lateral foot above the malleolus that is small  and appears to have no debris with intact surrounding skin. Patient is on increased dose of Lasix also as a means to edema management 3/25; the patient has a nonhealing venous ulcer on the medial left leg and last week developed a smaller area on the lateral left calf. We have been using Hydrofera Blue with a contact layer. 4/1; no major change in these wounds areas. Left medial and more recently left lateral calf. I tried Iodoflex last week  to aid in debridement she did not tolerate this. She stated her pain was terrible all week. She took the top layer of the 4 layer compression off. 4/8; the patient actually looks somewhat better in terms of her more prominent left lateral calf wound. There is some healthy looking tissue here. She is still complaining of a lot of discomfort. 4/15; patient in a lot of pain secondary to sciatica. She is on a prednisone taper prescribed by her primary physician. She has the 2 areas one on the left medial and more recently a smaller area on the left lateral calf. Both of these just above the malleoli 4/22; her back pain is better but she still states she is very uncomfortable and now feels she is intolerant to the [employer]. No real change in the wounds we have been using Sorbact. She has been previously intolerant to Iodoflex. There is not a lot of option about what we can use to debride this wound under compression that she no doubt needs. sHe states Ultram no longer works for her pain 4/29; no major change in the wounds slightly increased depth. Surface on the original medial wound perhaps somewhat improved however the more recent area on the lateral left ankle is 100% covered in very adherent debris we have been using Sorbact. She tolerates 4 layer compression well and her edema control is a lot better. She has not had to come in for a nurse check 5/6; no major change in the condition of the wounds. She did consent to debridement today which was done with  some difficulty. Continuing Sorbact. She did not tolerate Iodoflex. She was in for a check of her compression the day after we wrapped her last week this was adjusted but nothing much was found 5/13; no major change in the condition or area of the wounds. I was able to get a fairly aggressive debridement done on the lateral left leg wound. Even using Sorbact under compression. She came back in on Friday to have the wrap changed. She says she felt uncomfortable on the lateral aspect of her ankle. She has a long history of chronic venous insufficiency including previous ablation surgery on this side. 5/20-Patient returns for wounds on left leg with both wounds covered in slough, with the lateral leg wound larger in size, she has been in 3 layer compression and felt more comfortable, she describes pain in ankle, in leg and pins and needles in foot, [name], [name] ([id number]) and is about to try Pamelor for this 6/3; wounds on the left lateral and left medial leg. The area medially which is the most recent of the 2 seems to have had the largest increase in dimensions. We have been using Sorbac to try and debride the surface. She has been to see orthopedics they apparently did a plain x-ray that was indeterminant. Diagnosed her with neuropathy and they have ordered an MRI to determine if there is underlying osteomyelitis. This was not high on my thought list but I suppose it is prudent. We have advised her to make an appointment with vein and vascular in [place]. She has a history of a left greater saphenous and accessory vein ablations I wonder if there is anything else that can be done from a surgical point of view to help in these difficult refractory wounds. We have previously healed this wound on one occasion but it keeps on reopening [medial side] 6/10; deep tissue culture I did last week I think on the  left medial wound showed both moderate E. coli and moderate staph aureus [MSSA]. She is  going to require antibiotics and I have chosen Augmentin. We have been using Sorbact and we have made better looking wound surface on both sides but certainly no improvement in wound area. She was back in last Friday apparently for a dressing changes the wrap was hurting her outer left ankle. She has not managed to get a hold of vein and vascular in [place]. We are going to have to make her that appointment 6/17; patient is tolerating the Augmentin. She had an MRI that I think was ordered by orthopedic surgeon this did not show osteomyelitis or an abscess did suggest cellulitis. We have been using Sorbact to the lateral and medial ankles. We have been trying to arrange a follow-up appointment with vein and vascular in [place] or did her original ablations. We apparently an area sent the request to vein and vascular in [facility] 6/24; patient has completed the Augmentin. We do not yet have a vein and vascular appointment in [place]. I am not sure what the issue is here we have asked her to call tomorrow. We are using Sorbact. Making some improvements and especially the medial wound. Both surfaces however look better medial and lateral. 7/1; the patient has been in contact with vein and vascular in [place] but has not yet received an appointment. Using Sorbact we have gradually improve the wound surface with no improvement in surface area. She is approved for Apligraf but the wound surface still is not completely viable. She has not had to come in for a dressing change 7/8; the patient has an appointment with vein and vascular on 7/31 which is a Friday afternoon. She is concerned about getting back here for [place] to dress her wounds. I think it is important to have them goal for her venous reflux/history of ablations etc. to see if anything else can be done. She apparently tested positive for 1 of the blood tests with regards to lupus and saw a rheumatologist. He has raised the issue of  vasculitis again. I have had this thought in the past however the evidence seems overwhelming that this is a venous reflux etiology. If the rheumatologist tells me there is clinical and laboratory investigation is positive for lupus I will rethink this. 7/15; the patient's wound surfaces are quite a bit better. The medial area which was her original wound now has no depth although the lateral wound which was the more recent area actually appears larger. Both with viable surfaces which is indeed better. Using Sorbact. I wanted to use Apligraf on her however there is the issue of the vein and vascular appointment on 7/31 at 2:00 in the afternoon which would not allow her to get back to be rewrapped and they would no doubt remove the graft 7/22; the patient's wound surfaces have moderate amount of debris although generally look better. The lateral one is larger with 2 small satellite areas superiorly. We are waiting for her vein and vascular appointment on 7/31. She has been approved for Apligraf which I would like to use after th 7/29; wound surfaces have improved no debridement is required we have been using Sorbact. She sees vein and vascular on Friday with this so question of whether anything can be done to lessen the likelihood of recurrence and/or speed the healing of these areas. She is already had previous ablations. She no doubt has severe venous hypertension 8/5-Patient returns at 1 week, she  was in [employer] for 3 days by her podiatrist, we have been using so backed to the wound, she has increased pain in both the wounds on the left lower leg especially the more distal one on the lateral aspect 8/12-Patient returns at 1 week and she is agreeable to having debridement in both wounds on her left leg today. We have been using Sorbact, and vascular studies were reviewed at last visit 8/19; the patient arrives with her wounds fairly clean and no debridement is required. We have used Sorbact which  is really done a nice job in cleaning up these very difficult wound surfaces. The patient saw Dr. Donzetta Matters of vascular surgery on 7/31. He did not feel that there was an arterial component. He felt that her treated greater saphenous vein is adequately addressed and that the small saphenous vein did not appear to be involved significantly. She was also noted to have deep venous reflux which is not treatable. Dr. Donzetta Matters mentioned the possibility of a central obstructive component leading to reflux and he offered her central venography. She wanted to discuss this or think about it. I have urged her to go ahead with this. She has had recurrent difficult wounds in these areas which do heal but after months in the clinic. If there is anything that can be done to reduce the likelihood of this I think it is worth it. 9/2 she is still working towards getting follow-up with Dr. Donzetta Matters to schedule her CT. Things are quite a bit worse venography. I put Apligraf on 2 weeks ago on both wounds on the medial and lateral part of her left lower leg. She arrives in clinic today with 3 superficial additional wounds above the area laterally and one below the wound medially. She describes a lot of discomfort. I think these are probably wrapped injuries. Does not look like she has cellulitis. 07/20/2019 on evaluation today patient appears to be doing somewhat poorly in regard to her lower extremity ulcers. She in fact showed signs of erythema in fact we may even be dealing with an infection at this time. Unfortunately I am unsure if this [name], [name] is just infection or if indeed there may be some allergic reaction that occurred as a result of the Apligraf application. With that being said that would be unusual but nonetheless not impossible in this patient is one who is unfortunately allergic to quite a bit. Currently we have been using the Sorbact which seems to do as well as anything for her. I do think we may  want to obtain a culture today to see if there is anything showing up there that may need to be addressed. 9/16; noted that last week the wounds look worse in 1 week follow-up of the Apligraf. Using Sorbact as of 2 days ago. She arrives with copious amounts of drainage and new skin breakdown on the back of the left calf. The wounds arm more substantial bilaterally. There is a fair amount of swelling in the left calf no overt DVT there is edema present I think in the left greater than right thigh. She is supposed to go on 9/28 for CT venography. The wounds on the medial and lateral calf are worse and she has new skin breakdown posteriorly at least new for me. This is almost developing into a circumferential wound area The Apligraf was taken off last week which I agree with things are not going in the right direction a culture was done we  do not have that back yet. She is on Augmentin that she started 2 days ago 9/23; dressing was changed by her nurses on Monday. In general there is no improvement in the wound areas although the area looks less angry than last week. She did get Augmentin for MSSA cultured on the 14th. She still appears to have too much swelling in the left leg even with 3 layer compression 9/30; the patient underwent her procedure on 9/28 by Dr. Donzetta Matters at [facility]. She was discovered to have the common iliac vein measuring 12.2 mm but at the level of L4-L5 measured 3 mm. After stenting it measured 10 mm. 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 Rouleaux flow in the common femoral and femoral vein was observed much improved after stenting. We are using silver alginate to the wounds on the medial and lateral ankle on the left. 4 layer compression 10/7; the patient had fluid swelling around her knee and 4 layer compression. At the advice of vein and vascular this was reduced to 3 layer which she is tolerating better. We have been using silver  alginate under 3 layer compression since last Friday 10/14; arrives with the areas on the left ankle looking a lot better. Inflammation in the area also a lot better. She came in for a nurse check on 10/9 10/21; continued nice improvement. Slight improvements in surface area of both the medial and lateral wounds on the left. A lot of the satellite lesions in the weeping erythema around these from stasis dermatitis is resolved. We have been using silver alginate 10/28; general improvement in the entire wound areas although not a lot of change in dimensions the wound certainly looks better. There is a lot less in terms of venous inflammation. Continue silver alginate this week however look towards Hydrofera Blue next week 11/4; very adherent debris on the medial wound left wound is not as bad. We have been using silver alginate. Change to [facility] today 11/11; very adherent debris on both wound areas. She went to vein and vascular last week and follow-up they put in [place] boot on this today. He says the [facility] was adherent. Wound is definitely not as good as last week. Especially on the left there the satellite lesions look more prominent 11/18; absolutely no better. erythema on lateral aspect with tenderness. 09/30/2019 on evaluation today patient appears to actually be doing better. Dr. Dellia Nims did put her on doxycycline last week which I do believe has helped her at this point. Fortunately there is no signs of active infection at this time. No fevers, chills, nausea, vomiting, or diarrhea. I do believe he may want extend the doxycycline for 7 additional days just to ensure everything does completely cleared up the patient is in agreement with that plan. Otherwise she is going require some sharp debridement today 12/2; patient is completing a 2-week course of doxycycline. I gave her this empirically for inflammation as well as infection when I last saw her 2 weeks ago. All of this  seems to be better. She is using silver alginate she has the area on the medial aspect of the larger area laterally and the 2 small satellite regions laterally above the major wound. 12/9; the patient's wound on the left medial and left lateral calf look really quite good. We have been using silver alginate. She saw vein and vascular in follow-up on 10/09/2019. She has had a previous left greater saphenous vein ablation by  Dr. Oscar La in 2016. More recently she underwent a left common iliac vein stent by Dr. 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 The swelling is improved and certainly the wounds have improved. The patient shows [place] today area on the right medial calf there is almost no wound but leaking lymphedema. She says she start this started 3 or 4 days ago. She did not traumatize it. It is not painful. She does not wear compression on that side 12/16; the patient continues to do well laterally. Medially still requiring debridement. The area on the right calf did not materialize to anything and is not currently open. We wrapped this last time. She has support stockings for that leg although I am not sure they are going to provide adequate compression 12/23; the lateral wound looks stable. Medially still requiring debridement for tightly adherent fibrinous debris. We've been LATALIA, DENTE ([id number]) using silver alginate. Surface area not any different 12/30; neither wound is any better with regards to surface and the area on the left lateral is larger. I been using silver alginate to the left lateral which look quite good last week and Sorbact to the left medial 11/11/2019. Lateral wound area actually looks better and somewhat smaller. Medial still requires a very aggressive debridement today. We have been using Sorbact on both wound areas 1/13; not much better still adherent debris bilaterally. I been using Sorbact. She has severe venous hypertension. Probably some degree of  dermal fibrosis distally. I wonder whether tighter compression might help and I am going to try that today. We also need to work on the bioburden 1/20; using Sorbact. She has severe venous hypertension status post stent placement for pelvic vein compression. We applied gentamicin last time to see if we could reduce bioburden I had some discussion with her today about the use of pentoxifylline. This is occasionally used in this setting for wounds with refractory venous insufficiency. However this interacts with Plavix. She tells me that she was put on this after stent placement for 3 months. She will call Dr. Claretha Cooper office to discuss 1/27; we are using gentamicin under Sorbact. 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 She has a stent. Wound medially is measuring smaller this week. Laterally measuring slightly larger although she has some satellite lesions superiorly 2/3; gentamicin under Sorbact under 4-layer compression. 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 She has a stent on Plavix. Her wounds are measuring smaller this week. More substantially laterally where there is a satellite lesion superiorly. Electronic Signature(s) Signed: 12/09/2019 4:41:52 PM By: Linton Ham MD Entered By: Linton Ham on 12/09/2019 14:47:23 Leeb, [name] -------------------------------------------------------------------------------- Physical Exam Details Patient Name: DANYEL, SALDATE. Date of Service: 12/09/2019 2:00 PM Medical Record Number: [id number] Patient Account Number: [id number] Date of Birth/Sex: Aug 01, 1946 (74 y.o. F) Treating RN: Cornell Barman Primary Care Provider: [name] Other Clinician: Referring Provider: [name] Treating Provider/Extender: Tito Dine in Treatment: 30 Constitutional Patient is hypertensive.. Pulse regular and within target range for patient.Marland Kitchen Respirations regular,  non-labored and within target range.. Temperature is normal and within the target range for the patient.Marland Kitchen appears in no distress. Cardiovascular Pedal pulses are palpable on the left. Somewhat concerned about the size differential in the thigh. This will need to be monitored. Integumentary (Hair, Skin) No surrounding erythema. Notes Wound exam oBoth areas on the lateral and medial ankle are measuring smaller. Under illumination still adherent debris but I did not debride this  mechanically. Washed it off with saline and gauze which she barely tolerates. There is no evidence of infection around the wound. Electronic Signature(s) Signed: 12/09/2019 4:41:52 PM By: Linton Ham MD Entered By: Linton Ham on 12/09/2019 14:49:59 [place], [name] ([id number]) -------------------------------------------------------------------------------- Physician Orders Details Patient Name: DEBY, PERRIER. Date of Service: 12/09/2019 2:00 PM Medical Record Number: [id number] Patient Account Number: [id number] Date of Birth/Sex: 1946-09-11 (74 y.o. F) Treating RN: Cornell Barman Primary Care Provider: [name] Other Clinician: Referring Provider: [name] Treating Provider/Extender: Tito Dine in Treatment: 50 Verbal / Phone Orders: No Diagnosis Coding Wound Cleansing Wound #5 Left,Medial Lower Leg o Cleanse wound with mild soap and water Wound #6 Left,Lateral Lower Leg o Cleanse wound with mild soap and water Wound #9 Left,Lateral,Posterior Lower Leg o Cleanse wound with mild soap and water Anesthetic (add to Medication List) Wound #5 Left,Medial Lower Leg o Topical Lidocaine 4% cream applied to wound bed prior to debridement (In Clinic Only). Wound #6 Left,Lateral Lower Leg o Topical Lidocaine 4% cream applied to wound bed prior to debridement (In Clinic Only). Wound #9 Left,Lateral,Posterior Lower Leg o Topical Lidocaine 4% cream applied to wound bed prior  to debridement (In Clinic Only). Skin Barriers/Peri-Wound Care Wound #5 Left,Medial Lower Leg o Other: - Gentamycin Wound #6 Left,Lateral Lower Leg o Other: - Gentamycin Wound #9 Left,Lateral,Posterior Lower Leg o Other: - Gentamycin Primary Wound Dressing Wound #5 Left,Medial Lower Leg o Other: - sorbact Wound #6 Left,Lateral Lower Leg o Other: - sorbact Wound #9 Left,Lateral,Posterior Lower Leg o Other: - sorbact Secondary Dressing Wound #5 Left,Medial Lower Leg o Drawtex JORETHA, WILBY. ([id number]) Wound #6 Left,Lateral Lower Leg o Drawtex Wound #9 Left,Lateral,Posterior Lower Leg o Drawtex Dressing Change Frequency Wound #5 Left,Medial Lower Leg o Change dressing every week o Other: - as needed. Wound #6 Left,Lateral Lower Leg o Change dressing every week o Other: - as needed. Wound #9 Left,Lateral,Posterior Lower Leg o Change dressing every week o Other: - as needed. Follow-up Appointments Wound #5 Left,Medial Lower Leg o Return Appointment in 1 week. o Nurse Visit as needed - Call of needed Wound #6 Left,Lateral Lower Leg o Return Appointment in 1 week. o Nurse Visit as needed - Call of needed Wound #9 Left,Lateral,Posterior Lower Leg o Return Appointment in 1 week. o Nurse Visit as needed - Call of needed Edema Control Wound #5 Left,Medial Lower Leg o 4-Layer Compression System - Left Lower Extremity. Wound #6 Left,Lateral Lower Leg o 4-Layer Compression System - Left Lower Extremity. Wound #9 Left,Lateral,Posterior Lower Leg o 4-Layer Compression System - Left Lower Extremity. Notes Patient to call MD about coming off Plavix. Electronic Signature(s) Signed: 12/09/2019 4:41:52 PM By: Linton Ham MD Signed: 12/10/2019 5:22:08 PM By: [name], [name], RN, CWS, [name], [name] Entered By: [name], [name], RN, CWS, Kim on 12/09/2019 14:40:38 [name], [name]  ([id number]) -------------------------------------------------------------------------------- Problem List Details Patient Name: TYLIN, SAGERT. Date of Service: 12/09/2019 2:00 PM Medical Record Number: [id number] Patient Account Number: [id number] Date of Birth/Sex: 02-15-1946 (74 y.o. 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 of 01/07/2019 No Yes skin I87.332 Chronic venous hypertension (idiopathic) with ulcer and 12/09/2019 No Yes inflammation of left lower extremity I89.0 Lymphedema, not elsewhere classified 12/10/2018 No Yes I82.592 Chronic  embolism and thrombosis of other specified deep 12/09/2019 No Yes vein of left lower extremity Inactive Problems ICD-10 Code Description Active Date Inactive Date L97.818 Non-pressure chronic ulcer of other part of right lower leg with other 10/14/2019 10/14/2019 specified severity Resolved Problems Electronic Signature(s) Signed: 12/09/2019 4:41:52 PM By: Linton Ham MD Entered By: Linton Ham on 12/09/2019 14:58:20 [place], [name] -------------------------------------------------------------------------------- Progress Note Details Patient Name: HOLLEY, PRUETT. Date of Service: 12/09/2019 2:00 PM Medical Record Number: [id number] Patient Account Number: [id number] Date of Birth/Sex: 07-13-1946 (74 y.o. F) Treating RN: Cornell Barman Primary Care Provider: [name] Other Clinician: Referring Provider: [name] Treating Provider/Extender: Tito Dine in Treatment: 60 Subjective History of Present Illness (HPI) Pleasant 74 year old with history of chronic venous insufficiency. No diabetes or peripheral  vascular disease. Left ABI 1.29. Questionable history of left lower extremity DVT. She developed a recurrent ulceration on her left lateral calf in December 2015, which she attributes to poor diet and subsequent lower extremity edema. She underwent endovenous laser ablation of her left greater saphenous vein in 2010. She underwent laser ablation of accessory branch of left GSV in April 2016 by Dr. Kellie Simmering at [facility]. She was previously wearing Unna boots, which she tolerated well. Tolerating 2 layer compression and cadexomer iodine. She returns to clinic for follow-up and is without new complaints. She denies any significant pain at this time. She reports persistent pain with pressure. No claudication or ischemic rest pain. No fever or chills. No drainage. READMISSION 11/13/16; this is a 74 year old woman who is not a diabetic. She is here for a review of a painful area on her left medial lower extremity. I note that she was seen here previously last year for wound I believe to be in the same area. At that time she had undergone previously a left greater saphenous vein ablation by Dr. Kellie Simmering and she had a ablation of the anterior accessory branch of the left greater saphenous vein in March 2016. Seeing that the wound actually closed over. In reviewing the history with her today the ulcer in this area has been recurrent. She describes a biopsy of this area in 2009 that only showed stasis physiology. 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 Do not see that she has ever had arterial studies in the left leg. She tells me she has a follow-up with Dr. Kellie Simmering in roughly 10 days In any case she developed the reopening of this area roughly a month ago. On the background of this she  describes rapidly increasing edema which has responded to Lasix 40 mg and metolazone 2.5 mg as well as the patient's lymph massage. She has been told she has both venous insufficiency and lymphedema but she cannot tolerate compression stockings 11/28/16; the patient saw Dr. Kellie Simmering recently. Per the patient he did arterial Dopplers in the office that did not show evidence of arterial insufficiency, per the patient he stated "treat this like an ordinary venous ulcer". She also saw her dermatologist Dr. Ronnald Ramp who felt that this was more of a vascular ulcer. In general things are improving although she arrives today with increasing bilateral lower extremity edema with weeping a deeper fluid through the wound on the left medial leg compatible with some degree of  lymphedema 12/04/16; the patient's wound is fully epithelialized but I don't think fully healed. We will do another week of depression with Promogran and TCA however I suspect we'll be able to discharge her next week. This is a very unusual-looking wound which was initially a figure-of-eight type wound lying on its side surrounded by petechial like hemorrhage. She has had venous ablation on this side. She apparently does not have an arterial issue per Dr. Kellie Simmering. She saw her dermatologist thought it was "vascular". Patient is definitely going to need ongoing compression and I talked about this with her today she will go to elastic therapy after she leaves here next week 12/11/16; the patient's wound is not completely closed today. She has surrounding scar tissue and in further discussion with the patient it would appear that she had ulcers in this area in 2009 for a prolonged period of time ultimately requiring a punch biopsy of this area that only showed venous insufficiency. I did not previously pickup on this part of the history from the patient. 12/18/16; the patient's wound is completely epithelialized. There is no open area here. She has  significant bilateral venous insufficiency with secondary lymphedema to a mild-to-moderate degree she does not have compression stockings.. She did not say anything to me when I was in the room, she told our intake nurse that she was still having pain in this area. This isn't unusual recurrent small open area. She is going to go to elastic therapy to obtain compression stockings. 12/25/16; the patient's wound is fully epithelialized. There is no open area here. The patient describes some continued episodic discomfort in this area medial left calf. However everything looks fine and healed here. She is been to elastic therapy and [name], [name] caught herself 15-20 mmHg stockings, they apparently were having trouble getting 20-30 mm stockings in her size 01/22/17; this is a patient we discharged from the clinic a month ago. She has a recurrent open wound on her medial left calf. She had 15 mm support stockings. I told her I thought she needed 20-30 mm compression stockings. She tells me that she has been ill with hospitalization secondary to asthma and is been found to have severe hypokalemia likely secondary to a combination of Lasix and metolazone. This morning she noted blistering and leaking fluid on the posterior part of her left leg. She called our intake nurse urgently and we was saw her this afternoon. She has not had any real discomfort here. I don't know that she's been wearing any stockings on this leg for at least 2-3 days. ABIs in this clinic were 1.21 on the right and 1.3 on the left. She is previously seen vascular surgery who does not think that there is a peripheral arterial issue. 01/30/17; Patient arrives with no open wound on the left leg. She has been to elastic therapy and obtained 20-32mmhg below knee stockings and she has one on the right leg today. READMISSION 02/19/18; this Fennema is a now 74 year old patient we've had in this clinic perhaps 3 times before. I had  last looked at her from January 07 December 2016 with an area on the medial left leg. We discharged her on 12/25/16 however she had to be readmitted on 01/22/17 with a recurrence. I have in my notes that we discharged her on 20-30 mm stockings although she tells me she was only wearing support hose because she cannot get stockings on predominantly related to her cervical spine surgery/issues. She has had  previous ablations done by vein and vascular in [place] including a great saphenous vein ablation on the left with an anterior accessory branch ablation I think both of these were in 2016. On one of the previous visit she had a biopsy noted 2009 that was negative. She is not felt to have an arterial issue. She is not a diabetic. She does have a history of obstructive sleep apnea hypertension asthma as well as chronic venous insufficiency and lymphedema. On this occasion she noted 2 dry scaly patch on her left leg. She tried to put lotion on this it didn't really help. There were 2 open areas.the patient has been seeing her primary physician from 02/05/18 through 02/14/18. She had Unna boots applied. The superior wound now on the lateral left leg has closed but she's had one wound that remains open on the lateral left leg. This is not the same spot as we dealt with in 2018. ABIs in this clinic were 1.3 bilaterally 02/26/18; patient has a small wound on the left lateral calf. Dimensions are down. She has chronic venous insufficiency and lymphedema. 03/05/18; small open area on the left lateral calf. Dimensions are down. Tightly adherent necrotic debris over the surface of the wound which was difficult to remove. Also the dressing [over collagen] stuck to the wound surface. This was removed with some difficulty as well. Change the primary dressing to Hydrofera Blue ready 03/12/18; small open area on the left lateral calf. Comes in with tightly adherent surface eschar as well as some adherent Hydrofera  Blue. 03/19/18; open area on the left lateral calf. Again adherent surface eschar as well as some adherent Hydrofera Blue nonviable subcutaneous tissue. She complained of pain all week even with the reduction from 4-3 layer compression I put on last week. Also she had an increase in her ankle and calf measurements probably related to the same thing. 03/26/18; open area on the left lateral calf. A very small open area remains here. We used silver alginate starting last week as the Hydrofera Blue seem to stick to the wound bed. In using 4-layer compression 04/02/18; the open area in the left lateral calf at some adherent slough which I removed there is no open area here. We are able to transition her into her own compression stocking. Truthfully I think this is probably his support hose. However this does not maintain skin integrity will be limited. She cannot put over the toe compression stockings on because of neck problems hand problems etc. She is allergic to the lining layer of juxta lites. We might be forced to use extremitease stocking should this fail READMIT 11/24/2018 Patient is now a 74 year old woman who is not a diabetic. She has been in this clinic on at least 3 previous occasions largely with recurrent wounds on her left leg secondary to chronic venous insufficiency with secondary lymphedema. Her situation is complicated by inability to get stockings on and an allergy to neoprene which is apparently a component and at least juxta lites and other stockings. As a result she really has not been wearing any stockings on her legs. She tells [place] that roughly 2 or 3 weeks ago she started noticing a stinging sensation just above her ankle on the left medial aspect. She has been diagnosed with pseudogout and she wondered whether this was what she was experiencing. She tried to dress this with something she bought at the store however subsequently it pulled skin off and now she has an open  wound  that is not improving. She has been using Vaseline gauze with a cover bandage. She saw her primary doctor last week who put an Haematologist on her. ABIs in this clinic was 1.03 on the left [name], [name]. ([id number]) 2/12; the area is on the left medial ankle. Odd-looking wound with what looks to be surface epithelialization but a multitude of small petechial openings. This clearly not closed yet. We have been using silver alginate under 3 layer compression with TCA 2/19; the wound area did not look quite as good this week. Necrotic debris over the majority of the wound surface which required debridement. She continues to have a multitude of what looked to be small petechial openings. She reminds [place] that she had a biopsy on this initially during her first outbreak in 2015 in [place] dermatology. She expresses concern about this being a possible melanoma. 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 I assured her that this does not look anything like melanoma. Except for the petechial reaction it does look like a venous insufficiency area and she certainly has evidence of this on both sides 2/26; a difficult area on the left medial ankle. The patient clearly has chronic venous hypertension with some degree of lymphedema. The odd thing about the area is the small petechial hemorrhages. I am not really sure how to explain this. This was present last time and this is not a compression injury. We have been using Hydrofera Blue which I changed to last week 3/4; still using Hydrofera Blue. Aggressive debridement today. She does not have known arterial issues. She has seen Dr. Kellie Simmering at [facility] vein and vascular and and has an ablation on the left. [Anterior accessory branch of the greater saphenous]. From what I remember they did not feel she had an arterial issue. The patient has had this area biopsied in  2009 at [facility] dermatology and by her recollection they said this was "stasis". She is also follow-up with dermatology locally who thought that this was more of a vascular issue 3/11; using Hydrofera Blue. Aggressive debridement today. She does not have an arterial issue. We are using 3 layer compression although we may need to go to 4. The patient has been in for multiple changes to her wrap since I last saw her a week ago. She says that the area was leaking. I do not have too much more information on what was found 01/19/19 on evaluation today patient was actually being seen for a nurse visit when unfortunately she had the area on her left lateral lower extremity as well as weeping from the right lower extremity that became apparent. Therefore we did end up actually seeing her for a full visit with myself. She is having some pain at this site as well but fortunately nothing too significant at this point. No fevers, chills, nausea, or vomiting noted at this time. 3/18-Patient is back to the clinic with the left leg venous leg ulcer, the ulcer is larger in size, has a surface that is densely adherent with fibrinous tissue, the Hydrofera Blue was used but is densely adherent and there was difficulty in removing it. The right lower extremity was also wrapped for weeping edema. Patient has a new area over the left lateral foot above the malleolus that is small and appears to have no debris with intact surrounding skin. Patient is on increased dose of Lasix also as a means to  edema management 3/25; the patient has a nonhealing venous ulcer on the medial left leg and last week developed a smaller area on the lateral left calf. We have been using Hydrofera Blue with a contact layer. 4/1; no major change in these wounds areas. Left medial and more recently left lateral calf. I tried Iodoflex last week to aid in debridement she did not tolerate this. She stated her pain was terrible all week. She took the  top layer of the 4 layer compression off. 4/8; the patient actually looks somewhat better in terms of her more prominent left lateral calf wound. There is some healthy looking tissue here. She is still complaining of a lot of discomfort. 4/15; patient in a lot of pain secondary to sciatica. She is on a prednisone taper prescribed by her primary physician. She has the 2 areas one on the left medial and more recently a smaller area on the left lateral calf. Both of these just above the malleoli 4/22; her back pain is better but she still states she is very uncomfortable and now feels she is intolerant to the [employer]. No real change in the wounds we have been using Sorbact. She has been previously intolerant to Iodoflex. There is not a lot of option about what we can use to debride this wound under compression that she no doubt needs. sHe states Ultram no longer works for her pain 4/29; no major change in the wounds slightly increased depth. Surface on the original medial wound perhaps somewhat improved however the more recent area on the lateral left ankle is 100% covered in very adherent debris we have been using Sorbact. She tolerates 4 layer compression well and her edema control is a lot better. She has not had to come in for a nurse check 5/6; no major change in the condition of the wounds. She did consent to debridement today which was done with some difficulty. Continuing Sorbact. She did not tolerate Iodoflex. She was in for a check of her compression the day after we wrapped her last week this was adjusted but nothing much was found 5/13; no major change in the condition or area of the wounds. I was able to get a fairly aggressive debridement done on the lateral left leg wound. Even using Sorbact under compression. She came back in on Friday to have the wrap changed. She says she felt uncomfortable on the lateral aspect of her ankle. She has a long history of chronic venous insufficiency  including previous ablation surgery on this side. 5/20-Patient returns for wounds on left leg with both wounds covered in slough, with the lateral leg wound larger in size, she has been in 3 layer compression and felt more comfortable, she describes pain in ankle, in leg and pins and needles in foot, and is about to try Pamelor for this 6/3; wounds on the left lateral and left medial leg. The area medially which is the most recent of the 2 seems to have had the largest increase in dimensions. We have been using Sorbac to try and debride the surface. She has been to see orthopedics [name], [name] they apparently did a plain x-ray that was indeterminant. Diagnosed her with neuropathy and they have ordered an MRI to determine if there is underlying osteomyelitis. This was not high on my thought list but I suppose it is prudent. We have advised her to make an appointment with vein and vascular in [place]. She has a history of a  left greater saphenous and accessory vein ablations I wonder if there is anything else that can be done from a surgical point of view to help in these difficult refractory wounds. We have previously healed this wound on one occasion but it keeps on reopening [medial side] 6/10; deep tissue culture I did last week I think on the left medial wound showed both moderate E. coli and moderate staph aureus [MSSA]. She is going to require antibiotics and I have chosen Augmentin. We have been using Sorbact and we have made better looking wound surface on both sides but certainly no improvement in wound area. She was back in last Friday apparently for a dressing changes the wrap was hurting her outer left ankle. She has not managed to get a hold of vein and vascular in [place]. We are going to have to make her that appointment 6/17; patient is tolerating the Augmentin. She had an MRI that I think was ordered by orthopedic surgeon this did not show osteomyelitis or an  abscess did suggest cellulitis. We have been using Sorbact to the lateral and medial ankles. We have been trying to arrange a follow-up appointment with vein and vascular in [place] or did her original ablations. We apparently an area sent the request to vein and vascular in [facility] 6/24; patient has completed the Augmentin. We do not yet have a vein and vascular appointment in [place]. I am not sure what the issue is here we have asked her to call tomorrow. We are using Sorbact. Making some improvements and especially the medial wound. Both surfaces however look better medial and lateral. 7/1; the patient has been in contact with vein and vascular in [place] but has not yet received an appointment. Using Sorbact we have gradually improve the wound surface with no improvement in surface area. She is approved for Apligraf but the wound surface still is not completely viable. She has not had to come in for a dressing change 7/8; the patient has an appointment with vein and vascular on 7/31 which is a Friday afternoon. She is concerned about getting back here for [place] to dress her wounds. I think it is important to have them goal for her venous reflux/history of ablations etc. to see if anything else can be done. She apparently tested positive for 1 of the blood tests with regards to lupus and saw a rheumatologist. He has raised the issue of vasculitis again. I have had this thought in the past however the evidence seems overwhelming that this is a venous reflux etiology. If the rheumatologist tells me there is clinical and laboratory investigation is positive for lupus I will rethink this. 7/15; the patient's wound surfaces are quite a bit better. The medial area which was her original wound now has no depth although the lateral wound which was the more recent area actually appears larger. Both with viable surfaces which is indeed better. Using Sorbact. I wanted to use Apligraf on her  however there is the issue of the vein and vascular appointment on 7/31 at 2:00 in the afternoon which would not allow her to get back to be rewrapped and they would no doubt remove the graft 7/22; the patient's wound surfaces have moderate amount of debris although generally look better. The lateral one is larger with 2 small satellite areas superiorly. We are waiting for her vein and vascular appointment on 7/31. She has been approved for Apligraf which I would like to use after th 7/29; wound surfaces have  improved no debridement is required we have been using Sorbact. She sees vein and vascular on Friday with this so question of whether anything can be done to lessen the likelihood of recurrence and/or speed the healing of these areas. She is already had previous ablations. She no doubt has severe venous hypertension 8/5-Patient returns at 1 week, she was in [place] for 3 days by her podiatrist, we have been using so backed to the wound, she has increased pain in both the wounds on the left lower leg especially the more distal one on the lateral aspect 8/12-Patient returns at 1 week and she is agreeable to having debridement in both wounds on her left leg today. We have been using Sorbact, and vascular studies were reviewed at last visit 8/19; the patient arrives with her wounds fairly clean and no debridement is required. We have used Sorbact which is really done a nice job in cleaning up these very difficult wound surfaces. The patient saw Dr. Donzetta Matters of vascular surgery on 7/31. He did not feel that there was an arterial component. He felt that her treated greater saphenous vein is adequately addressed and that the small saphenous vein did not appear to be involved significantly. She was also noted to have deep venous reflux which is not treatable. Dr. Donzetta Matters mentioned the possibility of a central obstructive component leading to reflux and he offered her central venography. She wanted to  discuss this or think about it. I have urged her to go ahead with this. She has had recurrent difficult wounds in these areas which do heal but after months in the clinic. If there is anything that can be done to reduce the likelihood of this I think it is worth it. 9/2 she is still working towards getting follow-up with Dr. Donzetta Matters to schedule her CT. Things are quite a bit worse venography. I put Apligraf on 2 weeks ago on both wounds on the medial and lateral part of her left lower leg. She arrives in clinic today with 3 superficial additional wounds above the area laterally and one below the wound medially. She describes a lot of discomfort. I think these are probably wrapped injuries. Does not look like she has cellulitis. 07/20/2019 on evaluation today patient appears to be doing somewhat poorly in regard to her lower extremity ulcers. She in fact showed signs of erythema in fact we may even be dealing with an infection at this time. Unfortunately I am unsure if this is just infection or if indeed there may be some allergic reaction that occurred as a result of the Apligraf application. With that being said that would be unusual but nonetheless not impossible in this patient is one who is unfortunately allergic to quite a bit. Currently we have been using the Sorbact which seems to do as well as anything for her. I do think we may want to obtain [name], [name] a culture today to see if there is anything showing up there that may need to be addressed. 9/16; noted that last week the wounds look worse in 1 week follow-up of the Apligraf. Using Sorbact as of 2 days ago. She arrives with copious amounts of drainage and new skin breakdown on the back of the left calf. The wounds arm more substantial bilaterally. There is a fair amount of swelling in the left calf no overt DVT there is edema present I think in the left greater than right thigh. She is supposed to go on 9/28  for CT  venography. The wounds on the medial and lateral calf are worse and she has new skin breakdown posteriorly at least new for me. This is almost developing into a circumferential wound area The Apligraf was taken off last week which I agree with things are not going in the right direction a culture was done we do not have that back yet. She is on Augmentin that she started 2 days ago 9/23; dressing was changed by her nurses on Monday. In general there is no improvement in the wound areas although the area looks less angry than last week. She did get Augmentin for MSSA cultured on the 14th. She still appears to have too much swelling in the left leg even with 3 layer compression 9/30; the patient underwent her procedure on 9/28 by Dr. Donzetta Matters at vascular and vein specialist. She was discovered to have the common iliac vein measuring 12.2 mm but at the level of L4-L5 measured 3 mm. After stenting it measured 10 mm. 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 Rouleaux flow in the common femoral and femoral vein was observed much improved after stenting. We are using silver alginate to the wounds on the medial and lateral ankle on the left. 4 layer compression 10/7; the patient had fluid swelling around her knee and 4 layer compression. At the advice of vein and vascular this was reduced to 3 layer which she is tolerating better. We have been using silver alginate under 3 layer compression since last Friday 10/14; arrives with the areas on the left ankle looking a lot better. Inflammation in the area also a lot better. She came in for a nurse check on 10/9 10/21; continued nice improvement. Slight improvements in surface area of both the medial and lateral wounds on the left. A lot of the satellite lesions in the weeping erythema around these from stasis dermatitis is resolved. We have been using silver alginate 10/28; general improvement in the entire wound areas although not a lot of  change in dimensions the wound certainly looks better. There is a lot less in terms of venous inflammation. Continue silver alginate this week however look towards Hydrofera Blue next week 11/4; very adherent debris on the medial wound left wound is not as bad. We have been using silver alginate. Change to [facility] today 11/11; very adherent debris on both wound areas. She went to vein and vascular last week and follow-up they put in [place] boot on this today. He says the [facility] was adherent. Wound is definitely not as good as last week. Especially on the left there the satellite lesions look more prominent 11/18; absolutely no better. erythema on lateral aspect with tenderness. 09/30/2019 on evaluation today patient appears to actually be doing better. Dr. Dellia Nims did put her on doxycycline last week which I do believe has helped her at this point. Fortunately there is no signs of active infection at this time. No fevers, chills, nausea, vomiting, or diarrhea. I do believe he may want extend the doxycycline for 7 additional days just to ensure everything does completely cleared up the patient is in agreement with that plan. Otherwise she is going require some sharp debridement today 12/2; patient is completing a 2-week course of doxycycline. I gave her this empirically for inflammation as well as infection when I last saw her 2 weeks ago. All of this seems to be better. She is using silver alginate she has the area on the medial aspect  of the larger area laterally and the 2 small satellite regions laterally above the major wound. 12/9; the patient's wound on the left medial and left lateral calf look really quite good. We have been using silver alginate. She saw vein and vascular in follow-up on 10/09/2019. She has had a previous left greater saphenous vein ablation by Dr. Oscar La in 2016. More recently she underwent a left common iliac vein stent by Dr. Donzetta Matters on 08/04/2019 due to May  Thurner type lesions. The swelling is improved and certainly the wounds have improved. The patient shows [place] today area on the right medial calf there is almost no wound but leaking lymphedema. She says she start this started 3 or 4 days ago. She did not traumatize it. It is not painful. She does not wear compression on that side 12/16; the patient continues to do well laterally. Medially still requiring debridement. The area on the right calf did not materialize to anything and is not currently open. We wrapped this last time. She has support stockings for that leg although I am not sure they are going to provide adequate compression 12/23; the lateral wound looks stable. Medially still requiring debridement for tightly adherent fibrinous debris. We've been using silver alginate. Surface area not any different 12/30; neither wound is any better with regards to surface and the area on the left lateral is larger. I been using silver alginate to the left lateral which look quite good last week and Sorbact to the left medial [name], [name] 11/11/2019. Lateral wound area actually looks better and somewhat smaller. Medial still requires a very aggressive debridement today. We have been using Sorbact on both wound areas 1/13; not much better still adherent debris bilaterally. I been using Sorbact. She has severe venous hypertension. Probably some degree of dermal fibrosis distally. I wonder whether tighter compression might help and I am going to try that today. We also need to work on the bioburden 1/20; using Sorbact. She has severe venous hypertension status post stent placement for pelvic vein compression. We applied gentamicin last time to see if we could reduce bioburden I had some discussion with her today about the use of pentoxifylline. This is occasionally used in this setting for wounds with refractory venous insufficiency. However this interacts with Plavix. She tells me that she  was put on this after stent placement for 3 months. She will call Dr. Claretha Cooper office to discuss 1/27; we are using gentamicin under Sorbact. 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 She has a stent. Wound medially is measuring smaller this week. Laterally measuring slightly larger although she has some satellite lesions superiorly 2/3; gentamicin under Sorbact under 4-layer compression. 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 She has a stent on Plavix. Her wounds are measuring smaller this week. More substantially laterally where there is a satellite lesion superiorly. Objective Constitutional Patient is hypertensive.. Pulse regular and within target range for patient.Marland Kitchen Respirations regular, non-labored and within target range.. Temperature is normal and within the target range for the patient.Marland Kitchen appears in no distress. Vitals Time Taken: 2:19 PM, Height: 63 in, Weight: 224.7 lbs, BMI: 39.8, Temperature: 98.8 F, Pulse: 79 bpm, Respiratory Rate: 16 breaths/min, Blood Pressure: 159/75 mmHg. Cardiovascular Pedal pulses are palpable on the left. Somewhat concerned about the size differential in the thigh. This will need to be monitored. General Notes: Wound exam Both areas on the lateral and medial ankle are measuring smaller. Under illumination still adherent debris  but I did not debride this mechanically. Washed it off with saline and gauze which she barely tolerates. There is no evidence of infection around the wound. Integumentary (Hair, Skin) No surrounding erythema. Wound #5 status is Open. Original cause of wound was Gradually Appeared. The wound is located on the Left,Medial Lower Leg. The wound measures 2.2cm length x 2.4cm width x 0.2cm depth; 4.147cm^2 area and 0.829cm^3 volume. There is Fat Layer (Subcutaneous Tissue) Exposed exposed. There is a large amount of serosanguineous drainage noted. The wound margin is flat and  intact. There is small (1-33%) pale granulation within the wound bed. There is a large (67-100%) amount of necrotic tissue within the wound bed including Adherent Slough. Wound #6 status is Open. Original cause of wound was Gradually Appeared. The wound is located on the Left,Lateral Lower Leg. The wound measures 4.5cm length x 2.6cm width x 0.3cm depth; 9.189cm^2 area and 2.757cm^3 volume. There is Fat Layer (Subcutaneous Tissue) Exposed exposed. There is a large amount of serosanguineous drainage noted. The wound margin is flat and intact. There is small (1-33%) pink granulation within the wound bed. There is a large (67-100%) amount of necrotic tissue within the wound bed including Adherent Slough. [name], [name] Wound #9 status is Open. Original cause of wound was Gradually Appeared. The wound is located on the Left,Lateral,Posterior Lower Leg. The wound measures 0.5cm length x 0.5cm width x 0.1cm depth; 0.196cm^2 area and 0.02cm^3 volume. There is Fat Layer (Subcutaneous Tissue) Exposed exposed. There is a large amount of serosanguineous drainage noted. The wound margin is flat and intact. There is small (1-33%) pink granulation within the wound bed. There is a large (67-100%) amount of necrotic tissue within the wound bed including Adherent Slough. 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 There was a Four Layer Compression Therapy Procedure with a pre-treatment ABI of 1 by Cornell Barman, RN. Post procedure Diagnosis Wound #5: Same as Pre-Procedure Wound #6 Pre-procedure diagnosis of Wound #6  is a Venous Leg Ulcer located on the Left,Lateral Lower Leg . There was a Four Layer Compression Therapy Procedure with a pre-treatment ABI of 1 by Cornell Barman, RN. Post procedure Diagnosis Wound #6: Same as Pre-Procedure Wound #9 Pre-procedure diagnosis of Wound #9 is a Venous Leg Ulcer located on the Left,Lateral,Posterior Lower Leg . There was a Four Layer Compression Therapy Procedure with a pre-treatment ABI of 1 by Cornell Barman, RN. Post procedure Diagnosis Wound #9: Same as Pre-Procedure Plan Wound Cleansing: Wound #5 Left,Medial Lower Leg: Cleanse wound with mild soap and water Wound #6 Left,Lateral Lower Leg: Cleanse wound with mild soap and water Wound #9 Left,Lateral,Posterior Lower Leg: Cleanse wound with mild soap and water Anesthetic (add to Medication List): CARLIANA, LEINWEBER ([id number]) Wound #5 Left,Medial Lower Leg: Topical Lidocaine 4% cream applied to wound bed prior to debridement (In Clinic Only). Wound #6 Left,Lateral Lower Leg: Topical Lidocaine 4% cream applied to wound bed prior to debridement (In Clinic Only). Wound #9 Left,Lateral,Posterior Lower Leg: Topical Lidocaine 4% cream applied to wound bed prior to debridement (In Clinic Only). Skin Barriers/Peri-Wound Care: Wound #5 Left,Medial Lower Leg: Other: - Gentamycin Wound #6 Left,Lateral Lower Leg: Other: -  Gentamycin Wound #9 Left,Lateral,Posterior Lower Leg: Other: - Gentamycin Primary Wound Dressing: Wound #5 Left,Medial Lower Leg: Other: - sorbact Wound #6 Left,Lateral Lower Leg: Other: - sorbact Wound #9 Left,Lateral,Posterior Lower Leg: Other: - sorbact Secondary Dressing: Wound #5 Left,Medial Lower Leg: Drawtex Wound #6 Left,Lateral Lower Leg: Drawtex Wound #9 Left,Lateral,Posterior Lower Leg: Drawtex Dressing Change Frequency: Wound #5 Left,Medial Lower Leg: Change dressing every week Other: - as needed. Wound #6 Left,Lateral Lower Leg: Change dressing every week Other: - as  needed. Wound #9 Left,Lateral,Posterior Lower Leg: Change dressing every week Other: - as needed. Follow-up Appointments: Wound #5 Left,Medial Lower Leg: Return Appointment in 1 week. Nurse Visit as needed - Call of needed Wound #6 Left,Lateral Lower Leg: Return Appointment in 1 week. Nurse Visit as needed - Call of needed Wound #9 Left,Lateral,Posterior Lower Leg: Return Appointment in 1 week. Nurse Visit as needed - Call of needed Edema Control: Wound #5 Left,Medial Lower Leg: 4-Layer Compression System - Left Lower Extremity. Wound #6 Left,Lateral Lower Leg: 4-Layer Compression System - Left Lower Extremity. Wound #9 Left,Lateral,Posterior Lower Leg: 4-Layer Compression System - Left Lower Extremity. General Notes: Patient to call MD about coming off Plavix. RHYTHM, RICOTTA ([id number]) 1. I am continuing with topical gentamicin under the Sorbact which really seems to have helped the surface biofilm 2. I am going to monitor the thigh on the left serially. It was that differential in size of the left greater than right thigh that took me off initially to the possibility of central venous hypertension 3. If this continues to increase I may need to contact vein and vascular. 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 She is on Plavix I would like to use pentoxifylline for her chronic venous insufficiency lower down the tree. Bit concerning about using both Plavix and pentoxifylline. 5. She measures 67 cm in roughly the left mid thigh circumference Electronic Signature(s) Signed: 12/09/2019 4:41:52 PM By: Linton Ham MD Entered By: Linton Ham on 12/09/2019 15:02:18 JOHNICA, RASER ([id number]) -------------------------------------------------------------------------------- SuperBill Details Patient Name: BRANDYE, DERYKE. Date of Service: 12/09/2019 Medical Record Number: [id number] Patient  Account Number: [id number] Date of Birth/Sex: Oct 29, 1946 (74 y.o. F) Treating RN: Cornell Barman Primary Care Provider: [name] Other Clinician: Referring Provider: [name] Treating Provider/Extender: Tito Dine in Treatment: 52 Diagnosis Coding ICD-10 Codes Code Description [phone] Non-pressure chronic ulcer of left calf limited to breakdown of skin I87.321 Chronic venous hypertension (idiopathic) with inflammation of right lower extremity I89.0 Lymphedema, not elsewhere classified L97.818 Non-pressure chronic ulcer of other part of right lower leg with other specified severity Facility Procedures CPT4 Code: [id number] Description: (Facility Use Only) [phone] - [facility] L[facility] LT LEG Modifier: Quantity: 1 Physician Procedures CPT4 Code Description: [id number] - WC PHYS LEVEL 4 - EST PT ICD-10 Diagnosis Description L97.221 Non-pressure chronic ulcer of left calf limited to breakdown of I87.321 Chronic venous hypertension (idiopathic) with inflammation of ri L97.818  Non-pressure chronic ulcer of other part of right lower leg with Modifier: skin ght lower extremi other specified Quantity: 1 ty severity Electronic Signature(s) Signed: 12/09/2019 4:41:52 PM By: Linton Ham MD Entered By: Linton Ham on 12/09/2019 15:02:27

## 2019-12-10 NOTE — Progress Notes (Signed)
HALEA, [name] ([id number]) Visit Report for 12/09/2019 Allergy List Details Patient Name: [name], [name]. Date of Service: 12/09/2019 2:00 PM Medical Record Number: [id number] Patient Account Number: [id number] Date of Birth/Sex: 1946-09-03 (74 y.o. F) Treating RN: Cornell Barman Primary Care Tanaya Dunigan: [name] Other Clinician: Referring Hjalmer Iovino: [name] Treating Jeryn Bertoni/Extender: Tito Dine in Treatment: 52 Allergies Active Allergies Sulfa (Sulfonamide Antibiotics) Reaction: facial swelling latex Neoprene Voltaren Reaction: wheezing asthma symptoms Severity: Moderate Allergy Notes Electronic Signature(s) Signed: 12/09/2019 5:12:00 PM By: [name], [name], RN, CWS, [name], [name] Entered By: [name], [name], RN, CWS, Kim on 12/09/2019 17:12:00 JAMECA, MORK ([id number]) -------------------------------------------------------------------------------- Arrival Information Details Patient Name: [name], [name]. Date of Service: 12/09/2019 2:00 PM Medical Record Number: [id number] Patient Account Number: [id number] Date of Birth/Sex: Jul 19, 1946 (74 y.o. F) Treating RN: Army Melia Primary Care Kawika Bischoff: [name] Other Clinician: Referring Dyshon Philbin: [name] Treating Jasani Lengel/Extender: Tito Dine in Treatment: 36 Visit Information Patient Arrived: Ambulatory Arrival Time: 14:18 Accompanied By: self Transfer Assistance: None Patient Identification Verified: Yes Patient Requires Transmission-Based No Precautions: Patient Has Alerts: Yes Patient Alerts: Patient on Blood Thinner aspirin 81 History Since Last Visit Added or deleted any medications: No Any new allergies or adverse reactions: No Had a fall or experienced change in activities of daily living that may affect risk of falls: No Signs or symptoms of abuse/neglect since last visito No Hospitalized since last visit: No Has Dressing in Place as Prescribed: Yes Electronic  Signature(s) Signed: 12/09/2019 3:28:35 PM By: Army Melia Entered By: Army Melia on 12/09/2019 14:19:09 Palo, [name] -------------------------------------------------------------------------------- Compression Therapy Details Patient Name: [name], [name]. Date of Service: 12/09/2019 2:00 PM Medical Record Number: [id number] Patient Account Number: [id number] Date of Birth/Sex: [name] 17, 1946 (74 y.o. F) Treating RN: Cornell Barman Primary Care Laquentin Loudermilk: [name] Other Clinician: Referring Kaimana Lurz: [name] Treating Sayana Salley/Extender: Tito Dine in Treatment: 52 Compression Therapy Performed for Wound Assessment: Wound #5 Left,Medial Lower Leg Performed By: Clinician Cornell Barman, RN Compression Type: Four Layer Pre Treatment ABI: 1 Post Procedure Diagnosis Same as Pre-procedure Electronic Signature(s) Signed: 12/10/2019 5:22:08 PM By: [name], [name], RN, CWS, [name], [name] Entered By: [name], [name], RN, CWS, Kim on 12/09/2019 14:38:42 [name] -------------------------------------------------------------------------------- Compression Therapy Details Patient Name: [name], [name]. Date of Service: 12/09/2019 2:00 PM Medical Record Number: [id number] Patient Account Number: [id number] Date of Birth/Sex: [birth year] (74 y.o. F) Treating RN: Cornell Barman Primary Care Marcus Schwandt: [name] Other Clinician: Referring Marilee Ditommaso: [name] Treating Jasey Cortez/Extender: Tito Dine in Treatment: 52 Compression Therapy Performed for Wound Assessment: Wound #6 Left,Lateral Lower Leg Performed By: Clinician Cornell Barman, RN Compression Type: Four Layer Pre Treatment ABI: 1 Post Procedure Diagnosis Same as Pre-procedure Electronic Signature(s) Signed: 12/10/2019 5:22:08 PM By: [name], [name], RN, CWS, [name], [name] Entered By: [name], [name], RN, CWS, Kim on 12/09/2019 14:38:42 [name]  ([id number]) -------------------------------------------------------------------------------- Compression Therapy Details Patient Name: [name], [name]. Date of Service: 12/09/2019 2:00 PM Medical Record Number: [id number] Patient Account Number: [id number] Date of Birth/Sex: [birth year] (74 y.o. F) Treating RN: Cornell Barman Primary Care Courtny Bennison: [name] Other Clinician: Referring Khalila Buechner: [name] Treating Kashmere Daywalt/Extender: Tito Dine in Treatment: 52 Compression Therapy Performed for Wound Assessment: Wound #9 Left,Lateral,Posterior Lower Leg Performed By: Clinician Cornell Barman, RN Compression Type: Four Layer Pre Treatment ABI: 1 Post Procedure Diagnosis Same as Pre-procedure Electronic Signature(s) Signed: 12/10/2019 5:22:08 PM By: [name], [name], RN, CWS, [name],  [name] Entered By: [name], [name], RN, CWS, Kim on 12/09/2019 14:38:42 [name], [name] -------------------------------------------------------------------------------- Encounter Discharge Information Details Patient Name: [name], [name]. Date of Service: 12/09/2019 2:00 PM Medical Record Number: [id number] Patient Account Number: [id number] Date of Birth/Sex: 1946-07-21 (74 y.o. F) Treating RN: Cornell Barman Primary Care Anagha Loseke: [name] Other Clinician: Referring Aly [name]: [name] Treating Sweta Halseth/Extender: Tito Dine in Treatment: 53 Encounter Discharge Information Items Discharge Condition: Stable Ambulatory Status: Ambulatory Discharge Destination: Home Transportation: Private Auto Accompanied By: self Schedule Follow-up Appointment: Yes Clinical Summary of Care: Electronic Signature(s) Signed: 12/10/2019 5:22:08 PM By: [name], [name], RN, CWS, [name], [name] Entered By: [name], [name], RN, CWS, Kim on 12/09/2019 14:42:56 [name] -------------------------------------------------------------------------------- Lower Extremity Assessment  Details Patient Name: [name], [name]. Date of Service: 12/09/2019 2:00 PM Medical Record Number: [id number] Patient Account Number: [id number] Date of Birth/Sex: 1945/12/02 (74 y.o. F) Treating RN: Army Melia Primary Care Tiwanda Threats: [name] Other Clinician: Referring Abdoulie Tierce: [name] Treating Shareef Eddinger/Extender: Tito Dine in Treatment: 52 Edema Assessment Assessed: [Left: No] [Right: No] Edema: [Left: Ye] [Right: s] Calf Left: Right: Point of Measurement: 33 cm From Medial Instep 47 cm cm Ankle Left: Right: Point of Measurement: 10 cm From Medial Instep 24 cm cm Vascular Assessment Pulses: Dorsalis Pedis Palpable: [Left:Yes] Electronic Signature(s) Signed: 12/09/2019 3:28:35 PM By: Army Melia Entered By: Army Melia on 12/09/2019 14:29:11 Cowens, [name] -------------------------------------------------------------------------------- Multi Wound Chart Details Patient Name: [name], [name]. Date of Service: 12/09/2019 2:00 PM Medical Record Number: [id number] Patient Account Number: [id number] Date of Birth/Sex: 12-Apr-1946 (74 y.o. F) Treating RN: Cornell Barman Primary Care Fredericka Bottcher: [name] Other Clinician: Referring Kinneth Fujiwara: [name] Treating Derrion Tritz/Extender: Tito Dine in Treatment: 58 Vital Signs Height(in): 63 Pulse(bpm): 32 Weight(lbs): 224.7 Blood Pressure(mmHg): 159/75 Body Mass Index(BMI): 40 Temperature(F): 98.8 Respiratory Rate 16 (breaths/min): Photos: Wound Location: Left Lower Leg - Medial Left Lower Leg - Lateral Left Lower Leg - Lateral, Posterior Wounding Event: Gradually Appeared Gradually Appeared Gradually Appeared Primary Etiology: Lymphedema Venous Leg Ulcer Venous Leg Ulcer Comorbid History: Cataracts, Asthma, Sleep Cataracts, Asthma, Sleep Cataracts, Asthma, Sleep Apnea, Deep Vein Apnea, Deep Vein Apnea, Deep Vein Thrombosis, Hypertension, Thrombosis, Hypertension,  Thrombosis, Hypertension, Peripheral Venous Disease, Peripheral Venous Disease, Peripheral Venous Disease, Osteoarthritis, Received Osteoarthritis, Received Osteoarthritis, Received Chemotherapy, Received Chemotherapy, Received Chemotherapy, Received Radiation Radiation Radiation Date Acquired: 11/19/2018 01/19/2019 07/08/2019 Weeks of Treatment: 52 46 22 Wound Status: Open Open Open Measurements L x W x D 2.2x2.4x0.2 4.5x2.6x0.3 0.5x0.5x0.1 (cm) Area (cm) : 4.147 9.189 0.196 Volume (cm) : 0.829 2.757 0.02 % Reduction in Area: 51.50% -4076.80% 65.30% % Reduction in Volume: 3.00% -12431.80% 64.90% Classification: Full Thickness Without Full Thickness Without Full Thickness Without Exposed Support Structures Exposed Support Structures Exposed Support Structures Exudate Amount: Large Large Large Exudate Type: Serosanguineous Serosanguineous Serosanguineous Exudate Color: red, brown red, brown red, brown Wound Margin: Flat and Intact Flat and Intact Flat and Intact Granulation Amount: Small (1-33%) Small (1-33%) Small (1-33%) Granulation Quality: Pale Pink Pink Necrotic Amount: Large (67-100%) Large (67-100%) Large (67-100%) Ferraris, Maddyn J. ([id number]) Exposed Structures: Fat Layer (Subcutaneous Fat Layer (Subcutaneous Fat Layer (Subcutaneous Tissue) Exposed: Yes Tissue) Exposed: Yes Tissue) Exposed: Yes Fascia: No Fascia: No Fascia: No Tendon: No Tendon: No Tendon: No Muscle: No Muscle: No Muscle: No Joint: No Joint: No Joint: No Bone: No Bone: No Bone: No Epithelialization: None None None Procedures Performed: Compression Therapy Compression Therapy Compression Therapy Treatment Notes  Wound #5 (Left, Medial Lower Leg) Notes gentamicin cream, sorbact, drawtex, 4 layer wrap with unna to anchor Wound #6 (Left, Lateral Lower Leg) Notes gentamicin cream, sorbact, drawtex, 4 layer wrap with unna to anchor Wound #9 (Left, Lateral, Posterior Lower Leg) Notes gentamicin  cream, sorbact, drawtex, 4 layer wrap with unna to anchor Electronic Signature(s) Signed: 12/09/2019 4:41:52 PM By: Linton Ham MD Entered By: Linton Ham on 12/09/2019 14:45:19 Sia, [name] -------------------------------------------------------------------------------- Multi-Disciplinary Care Plan Details Patient Name: [name], [name]. Date of Service: 12/09/2019 2:00 PM Medical Record Number: [id number] Patient Account Number: [id number] Date of Birth/Sex: 14-Apr-1946 (74 y.o. F) Treating RN: Cornell Barman Primary Care Vearl Allbaugh: [name] Other Clinician: Referring Quantavius Humm: [name] Treating Helton Oleson/Extender: Tito Dine in Treatment: 26 Active Inactive Orientation to the Wound Care Program Nursing Diagnoses: Knowledge deficit related to the wound healing center program Goals: Patient/caregiver will verbalize understanding of the [facility] Program Date Initiated: 12/10/2018 Target Resolution Date: 01/09/2019 Goal Status: Active Interventions: Provide education on orientation to the wound center Notes: Soft Tissue Infection Nursing Diagnoses: Impaired tissue integrity Goals: Patient's soft tissue infection will resolve Date Initiated: 12/10/2018 Target Resolution Date: 01/09/2019 Goal Status: Active Interventions: Assess signs and symptoms of infection every visit Notes: Venous Leg Ulcer Nursing Diagnoses: Actual venous Insuffiency (use after diagnosis is confirmed) Goals: Patient will maintain optimal edema control Date Initiated: 12/10/2018 Target Resolution Date: 01/09/2019 Goal Status: Active Interventions: Assess peripheral edema status every visit. [name], [name] Treatment Activities: Therapeutic compression applied : 12/10/2018 Notes: Wound/Skin Impairment Nursing Diagnoses: Impaired tissue integrity Goals: Patient/caregiver will verbalize understanding of skin care regimen Date Initiated:  12/10/2018 Target Resolution Date: 01/09/2019 Goal Status: Active Interventions: Assess ulceration(s) every visit Treatment Activities: Topical wound management initiated : 12/10/2018 Notes: Electronic Signature(s) Signed: 12/10/2019 5:22:08 PM By: [name], [name], RN, CWS, [name], [name] Entered By: [name], [name], RN, CWS, Kim on 12/09/2019 14:36:17 [name], [name] -------------------------------------------------------------------------------- Pain Assessment Details Patient Name: NAYLAH, STORTS. Date of Service: 12/09/2019 2:00 PM Medical Record Number: [id number] Patient Account Number: [id number] Date of Birth/Sex: [birth year] (74 y.o. F) Treating RN: Army Melia Primary Care Ahmira Boisselle: [name] Other Clinician: Referring Argel Pablo: [name] Treating Sahara Fujimoto/Extender: Tito Dine in Treatment: 52 Active Problems Location of Pain Severity and Description of Pain Patient Has Paino No Site Locations Pain Management and Medication Current Pain Management: Electronic Signature(s) Signed: 12/09/2019 3:28:35 PM By: Army Melia Entered By: Army Melia on 12/09/2019 14:19:14 Quaranta, [name] -------------------------------------------------------------------------------- Patient/Caregiver Education Details Patient Name: THAILY, STEFANO. Date of Service: 12/09/2019 2:00 PM Medical Record Number: [id number] Patient Account Number: [id number] Date of Birth/Gender: 04-May-1946 (74 y.o. F) Treating RN: Cornell Barman Primary Care Physician: [name] Other Clinician: Referring Physician: [name] Treating Physician/Extender: Tito Dine in Treatment: 14 Education Assessment Education Provided To: Patient Education Topics Provided Venous: Handouts: Controlling Swelling with Multilayered Compression Wraps Methods: Demonstration, Explain/Verbal Responses: State content correctly Electronic Signature(s) Signed: 12/10/2019 5:22:08  PM By: [name], [name], RN, CWS, [name], [name] Entered By: [name], [name], RN, CWS, Kim on 12/09/2019 14:41:15 [name] -------------------------------------------------------------------------------- Wound Assessment Details Patient Name: ADELENE, HEHIR. Date of Service: 12/09/2019 2:00 PM Medical Record Number: [id number] Patient Account Number: [id number] Date of Birth/Sex: 05-23-1946 (74 y.o. F) Treating RN: Army Melia Primary Care Valoria Tamburri: [name] Other Clinician: Referring Dahlton Hinde: [name] Treating Ishitha Roper/Extender: [name] Dillon Weeks in Treatment: 26 Wound Status Wound Number: 5 Primary Lymphedema  Etiology: Wound Location: Left Lower Leg - Medial Wound Open Wounding Event: Gradually Appeared Status: Date Acquired: 11/19/2018 Comorbid Cataracts, Asthma, Sleep Apnea, Deep Vein Weeks Of Treatment: 52 History: Thrombosis, Hypertension, Peripheral Venous Clustered Wound: No Disease, Osteoarthritis, Received Chemotherapy, Received Radiation Photos Wound Measurements Length: (cm) 2.2 Width: (cm) 2.4 Depth: (cm) 0.2 Area: (cm) 4.147 Volume: (cm) 0.829 % Reduction in Area: 51.5% % Reduction in Volume: 3% Epithelialization: None Wound Description Full Thickness Without Exposed Support Classification: Structures Wound Margin: Flat and Intact Exudate Large Amount: Exudate Type: Serosanguineous Exudate Color: red, brown Foul Odor After Cleansing: No Slough/Fibrino Yes Wound Bed Granulation Amount: Small (1-33%) Exposed Structure Granulation Quality: Pale Fascia Exposed: No Necrotic Amount: Large (67-100%) Fat Layer (Subcutaneous Tissue) Exposed: Yes Necrotic Quality: Adherent Slough Tendon Exposed: No Muscle Exposed: No Joint Exposed: No Bone Exposed: No Kaner, Lovada J. ([id number]) Treatment Notes Wound #5 (Left, Medial Lower Leg) Notes gentamicin cream, sorbact, drawtex, 4 layer wrap with unna to anchor Electronic  Signature(s) Signed: 12/09/2019 3:28:35 PM By: Army Melia Entered By: Army Melia on 12/09/2019 14:27:21 Else, [name] -------------------------------------------------------------------------------- Wound Assessment Details Patient Name: SHOSHANNAH, KIELAR. Date of Service: 12/09/2019 2:00 PM Medical Record Number: [id number] Patient Account Number: [id number] Date of Birth/Sex: 11-19-1945 (74 y.o. F) Treating RN: Army Melia Primary Care Kima Malenfant: [name] Other Clinician: Referring Xzavian Semmel: [name] Treating Nabila Albarracin/Extender: Tito Dine in Treatment: 52 Wound Status Wound Number: 6 Primary Venous Leg Ulcer Etiology: Wound Location: Left Lower Leg - Lateral Wound Open Wounding Event: Gradually Appeared Status: Date Acquired: 01/19/2019 Comorbid Cataracts, Asthma, Sleep Apnea, Deep Vein Weeks Of Treatment: 46 History: Thrombosis, Hypertension, Peripheral Venous Clustered Wound: No Disease, Osteoarthritis, Received Chemotherapy, Received Radiation Photos Wound Measurements Length: (cm) 4.5 Width: (cm) 2.6 Depth: (cm) 0.3 Area: (cm) 9.189 Volume: (cm) 2.757 % Reduction in Area: -4076.8% % Reduction in Volume: -12431.8% Epithelialization: None Wound Description Full Thickness Without Exposed Support Classification: Structures Wound Margin: Flat and Intact Exudate Large Amount: Exudate Type: Serosanguineous Exudate Color: red, brown Foul Odor After Cleansing: No Slough/Fibrino Yes Wound Bed Granulation Amount: Small (1-33%) Exposed Structure Granulation Quality: Pink Fascia Exposed: No Necrotic Amount: Large (67-100%) Fat Layer (Subcutaneous Tissue) Exposed: Yes Necrotic Quality: Adherent Slough Tendon Exposed: No Muscle Exposed: No Joint Exposed: No Bone Exposed: No Oler, Krystian J. ([id number]) Treatment Notes Wound #6 (Left, Lateral Lower Leg) Notes gentamicin cream, sorbact, drawtex, 4 layer wrap with unna to  anchor Electronic Signature(s) Signed: 12/09/2019 3:28:35 PM By: Army Melia Entered By: Army Melia on 12/09/2019 14:27:53 Seibert, [name] -------------------------------------------------------------------------------- Wound Assessment Details Patient Name: CHAZ, LESTER. Date of Service: 12/09/2019 2:00 PM Medical Record Number: [id number] Patient Account Number: [id number] Date of Birth/Sex: 10-Jul-1946 (74 y.o. F) Treating RN: Army Melia Primary Care Antonella Upson: [name] Other Clinician: Referring Celestine Prim: [name] Treating Taten Merrow/Extender: Tito Dine in Treatment: 52 Wound Status Wound Number: 9 Primary Venous Leg Ulcer Etiology: Wound Location: Left Lower Leg - Lateral, Posterior Wound Open Wounding Event: Gradually Appeared Status: Date Acquired: 07/08/2019 Comorbid Cataracts, Asthma, Sleep Apnea, Deep Vein Weeks Of Treatment: 22 History: Thrombosis, Hypertension, Peripheral Venous Clustered Wound: No Disease, Osteoarthritis, Received Chemotherapy, Received Radiation Photos Wound Measurements Length: (cm) 0.5 Width: (cm) 0.5 Depth: (cm) 0.1 Area: (cm) 0.196 Volume: (cm) 0.02 % Reduction in Area: 65.3% % Reduction in Volume: 64.9% Epithelialization: None Wound Description Full Thickness Without Exposed Support Classification: Structures Wound Margin: Flat and Intact Exudate Large Amount: Exudate Type: Serosanguineous Exudate Color:  red, brown Foul Odor After Cleansing: No Slough/Fibrino Yes Wound Bed Granulation Amount: Small (1-33%) Exposed Structure Granulation Quality: Pink Fascia Exposed: No Necrotic Amount: Large (67-100%) Fat Layer (Subcutaneous Tissue) Exposed: Yes Necrotic Quality: Adherent Slough Tendon Exposed: No Muscle Exposed: No Joint Exposed: No Bone Exposed: No JENNEA, TRAMONTE ([id number]) Treatment Notes Wound #9 (Left, Lateral, Posterior Lower Leg) Notes gentamicin cream, sorbact,  drawtex, 4 layer wrap with unna to anchor Electronic Signature(s) Signed: 12/09/2019 3:28:35 PM By: Army Melia Entered By: Army Melia on 12/09/2019 14:28:16 Baumgart, [name] -------------------------------------------------------------------------------- Vitals Details Patient Name: ROBERTA, GOLDMAN. Date of Service: 12/09/2019 2:00 PM Medical Record Number: [id number] Patient Account Number: [id number] Date of Birth/Sex: 27-Dec-1945 (74 y.o. F) Treating RN: Army Melia Primary Care Xochilth Standish: [name] Other Clinician: Referring Attie Nawabi: [name] Treating Terrion Gencarelli/Extender: Tito Dine in Treatment: 52 Vital Signs Time Taken: 14:19 Temperature (F): 98.8 Height (in): 63 Pulse (bpm): 79 Weight (lbs): 224.7 Respiratory Rate (breaths/min): 16 Body Mass Index (BMI): 39.8 Blood Pressure (mmHg): 159/75 Reference Range: 80 - 120 mg / dl Electronic Signature(s) Signed: 12/09/2019 3:28:35 PM By: Army Melia Entered By: Army Melia on 12/09/2019 14:19:48

## 2019-12-11 ENCOUNTER — Telehealth: Payer: Self-pay

## 2019-12-11 NOTE — Telephone Encounter (Signed)
Pt called asking to stop taking Plavix as she thought it would just be a 3 month course and she has reached the 3 months. Per Dr. Donzetta Matters pt can stop Plavix. She stated she f/u weekly with Dr. Dellia Nims for non-healing wound and he felt her L thigh was swollen and wanted her to mention it to Dr. Donzetta Matters when she called. She is not experiencing any pain. Dr. Donzetta Matters has been made aware of swelling. She will f/u with lab and visit in early March but will let us know if anything worsens/changes in the meantime.

## 2019-12-16 ENCOUNTER — Other Ambulatory Visit: Payer: Self-pay

## 2019-12-16 ENCOUNTER — Encounter: Payer: Medicare Other | Admitting: Internal Medicine

## 2019-12-16 DIAGNOSIS — I82502 Chronic embolism and thrombosis of unspecified deep veins of left lower extremity: Secondary | ICD-10-CM | POA: Diagnosis not present

## 2019-12-16 DIAGNOSIS — I872 Venous insufficiency (chronic) (peripheral): Secondary | ICD-10-CM | POA: Diagnosis not present

## 2019-12-16 DIAGNOSIS — J45909 Unspecified asthma, uncomplicated: Secondary | ICD-10-CM | POA: Diagnosis not present

## 2019-12-16 DIAGNOSIS — L97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d: Secondary | ICD-10-CM | POA: Diagnosis not present

## 2019-12-16 DIAGNOSIS — I10 Essential (primary) hypertension: Secondary | ICD-10-CM | POA: Diagnosis not present

## 2019-12-16 DIAGNOSIS — L97221 Non-pressure chronic ulcer of left calf limited to breakdown of skin: Secondary | ICD-10-CM | POA: Diagnosis not present

## 2019-12-16 DIAGNOSIS — I890 Lymphedema, not elsewhere classified: Secondary | ICD-10-CM | POA: Diagnosis not present

## 2019-12-16 DIAGNOSIS — I87332 Chronic venous hypertension (idiopathic) with ulcer and inflammation of left lower extremity: Secondary | ICD-10-CM | POA: Diagnosis not present

## 2019-12-17 NOTE — Progress Notes (Signed)
[name], [name] Visit Report for 12/16/2019 Arrival Information Details Patient Name: [name], [name]. Date of Service: 12/16/2019 12:45 PM Medical Record Number: [id number] Patient Account Number: [id number] Date of Birth/Sex: 21-Jun-1946 (74 y.o. F) Treating RN: [name] Primary Care [name]: [name] Other Clinician: Referring [name]: [name] Treating [name]/Extender: [name] in Treatment: 25 Visit Information History Since Last Visit Added or deleted any medications: Yes Patient Arrived: Ambulatory Any new allergies or adverse reactions: No Arrival Time: 12:48 Had a fall or experienced change in No Accompanied By: self activities of daily living that may affect Transfer Assistance: None risk of falls: Patient Identification Verified: Yes Signs or symptoms of abuse/neglect since last visito No Secondary Verification Process Yes Hospitalized since last visit: No Completed: Implantable device outside of the clinic excluding No Patient Requires Transmission-Based No cellular tissue based products placed in the center Precautions: since last visit: Patient Has Alerts: Yes Has Dressing in Place as Prescribed: Yes Patient Alerts: Patient on Blood Has Compression in Place as Prescribed: Yes Thinner Pain Present Now: No aspirin 81 Electronic Signature(s) Signed: 12/16/2019 3:52:22 PM By: [name] RCP, RRT, CHT Entered By: [name] on 12/16/2019 12:48:58 [name], [name] -------------------------------------------------------------------------------- Compression Therapy Details Patient Name: [name], [name]. Date of Service: 12/16/2019 12:45 PM Medical Record Number: [id number] Patient Account Number: [id number] Date of Birth/Sex: Dec 20, 1945 (74 y.o. F) Treating RN: [name] Primary Care Tonny Isensee: [name] Other Clinician: Referring [name]: [name] Treating [name]/Extender: [name] in Treatment: 53 Compression Therapy Performed for Wound Assessment: Wound #5 Left,Medial Lower Leg Performed By: Clinician Amber Barman, RN Compression Type: Four Layer Pre Treatment ABI: 1 Post Procedure Diagnosis Same as Pre-procedure Electronic Signature(s) Signed: 12/16/2019 5:56:57 PM By: [name], [name], RN, CWS, [name], [name] Entered By: [name], [name], RN, CWS, [name] on 12/16/2019 13:09:05 [name] -------------------------------------------------------------------------------- Compression Therapy Details Patient Name: [name], [name]. Date of Service: 12/16/2019 12:45 PM Medical Record Number: [id number] Patient Account Number: [id number] Date of Birth/Sex: June 05, 1946 (74 y.o. F) Treating RN: [name] Primary Care [name]: [name] Other Clinician: Referring [name]: [name] Treating [name]/Extender: [name] in Treatment: 53 Compression Therapy Performed for Wound Assessment: Wound #6 Left,Lateral Lower Leg Performed By: Clinician Amber Barman, RN Compression Type: Four Layer Pre Treatment ABI: 1 Post Procedure Diagnosis Same as Pre-procedure Electronic Signature(s) Signed: 12/16/2019 5:56:57 PM By: [name], [name], RN, CWS, [name], [name] Entered By: [name], [name], RN, CWS, [name] on 12/16/2019 13:09:05 [name] -------------------------------------------------------------------------------- Compression Therapy Details Patient Name: [name], [name]. Date of Service: 12/16/2019 12:45 PM Medical Record Number: [id number] Patient Account Number: [id number] Date of Birth/Sex: 06/05/1946 (74 y.o. F) Treating RN: [name] Primary Care [name]: [name] Other Clinician: Referring [name]: [name] Treating [name]/Extender: [name] in Treatment: 53 Compression Therapy Performed for Wound Assessment: Wound #9 Left,Lateral,Posterior  Lower Leg Performed By: Clinician Amber Barman, RN Compression Type: Four Layer Pre Treatment ABI: 1 Post Procedure Diagnosis Same as Pre-procedure Electronic Signature(s) Signed: 12/16/2019 5:56:57 PM By: [name], [name], RN, CWS, [name], [name] Entered By: [name], [name], RN, CWS, [name] on 12/16/2019 13:09:05 [name] -------------------------------------------------------------------------------- Encounter Discharge Information Details Patient Name: [name], [name]. Date of Service: 12/16/2019 12:45 PM Medical Record Number: [id number] Patient Account Number: [id number] Date of Birth/Sex: 10-26-1946 (74 y.o. F) Treating RN: [name] Primary Care [name]: [name] Other Clinician: Referring [name]: [name] Treating  [name]/Extender: [name] in Treatment: 58 Encounter Discharge Information Items Discharge Condition: Stable Ambulatory Status: Ambulatory Discharge Destination: Home Transportation: Private Auto Accompanied By: self Schedule Follow-up Appointment: Yes Clinical Summary of Care: Electronic Signature(s) Signed: 12/16/2019 5:56:57 PM By: [name], [name], RN, CWS, [name], [name] Entered By: [name], [name], RN, CWS, [name] on 12/16/2019 13:12:28 [name] -------------------------------------------------------------------------------- Lower Extremity Assessment Details Patient Name: [name], [name]. Date of Service: 12/16/2019 12:45 PM Medical Record Number: [id number] Patient Account Number: [id number] Date of Birth/Sex: Nov 12, 1945 (74 y.o. F) Treating RN: [name] Primary Care [name]: [name] Other Clinician: Referring [name]: [name] Treating [name]/Extender: [name] in Treatment: 53 Edema Assessment Assessed: [Left: No] [Right: No] Edema: [Left: Ye] [Right: s] Calf Left: Right: Point of Measurement: 33 cm From Medial Instep 48 cm cm Ankle Left: Right: Point of  Measurement: 10 cm From Medial Instep 23 cm cm Vascular Assessment Pulses: Dorsalis Pedis Palpable: [Left:Yes] Electronic Signature(s) Signed: 12/16/2019 4:52:40 PM By: [name] Entered By: [name] on 12/16/2019 12:57:56 Cassaday, [name] -------------------------------------------------------------------------------- Multi Wound Chart Details Patient Name: [name], [name]. Date of Service: 12/16/2019 12:45 PM Medical Record Number: [id number] Patient Account Number: [id number] Date of Birth/Sex: January 22, 1946 (74 y.o. F) Treating RN: [name] Primary Care Alejah Aristizabal: [name] Other Clinician: Referring Caelen Reierson: [name] Treating Khari Lett/Extender: [name] in Treatment: 20 Vital Signs Height(in): 63 Pulse(bpm): 69 Weight(lbs): 224.7 Blood Pressure(mmHg): 142/75 Body Mass Index(BMI): 40 Temperature(F): 98.7 Respiratory Rate 16 (breaths/min): Photos: Wound Location: Left Lower Leg - Medial Left Lower Leg - Lateral Left, Lateral, Posterior Lower Leg Wounding Event: Gradually Appeared Gradually Appeared Gradually Appeared Primary Etiology: Lymphedema Venous Leg Ulcer Venous Leg Ulcer Comorbid History: Cataracts, Asthma, Sleep Cataracts, Asthma, Sleep Cataracts, Asthma, Sleep Apnea, Deep Vein Apnea, Deep Vein Apnea, Deep Vein Thrombosis, Hypertension, Thrombosis, Hypertension, Thrombosis, Hypertension, Peripheral Venous Disease, Peripheral Venous Disease, Peripheral Venous Disease, Osteoarthritis, Received Osteoarthritis, Received Osteoarthritis, Received Chemotherapy, Received Chemotherapy, Received Chemotherapy, Received Radiation Radiation Radiation Date Acquired: 11/19/2018 01/19/2019 07/08/2019 Weeks of Treatment: 53 47 23 Wound Status: Open Open Open Measurements L x W x D 2x1.4x0.2 3.6x3.5x0.3 0.6x0.5x0.1 (cm) Area (cm) : 2.199 9.896 0.236 Volume (cm) : 0.44 2.969 0.024 % Reduction in Area: 74.30% -4398.20%  58.20% % Reduction in Volume: 48.50% -13395.50% 57.90% Classification: Full Thickness Without Full Thickness Without Full Thickness Without Exposed Support Structures Exposed Support Structures Exposed Support Structures Exudate Amount: Large Large Large Exudate Type: Serosanguineous Serosanguineous Serosanguineous Exudate Color: red, brown red, brown red, brown Wound Margin: Flat and Intact Flat and Intact Flat and Intact Granulation Amount: Small (1-33%) Small (1-33%) Small (1-33%) Granulation Quality: Pale Pink Pink Necrotic Amount: Large (67-100%) Large (67-100%) Large (67-100%) Hajduk, Chrishawn J. ([id number]) Exposed Structures: Fat Layer (Subcutaneous Fat Layer (Subcutaneous Fat Layer (Subcutaneous Tissue) Exposed: Yes Tissue) Exposed: Yes Tissue) Exposed: Yes Fascia: No Fascia: No Fascia: No Tendon: No Tendon: No Tendon: No Muscle: No Muscle: No Muscle: No Joint: No Joint: No Joint: No Bone: No Bone: No Bone: No Epithelialization: None None None Procedures Performed: Compression Therapy Compression Therapy Compression Therapy Treatment Notes Wound #5 (Left, Medial Lower Leg) Notes gentamicin cream, sorbact, drawtex, 4 layer wrap with unna to anchor Wound #6 (Left, Lateral Lower Leg) Notes gentamicin cream, sorbact, drawtex, 4 layer wrap with unna to anchor Wound #9 (Left, Lateral, Posterior Lower Leg) Notes gentamicin cream, sorbact, drawtex, 4 layer wrap with unna to anchor Electronic Signature(s) Signed: 12/16/2019 5:43:36 PM By: Dellia Nims,  Legrand Como MD Entered By: Linton Ham on 12/16/2019 13:15:19 Taked[employer], [name] -------------------------------------------------------------------------------- Multi-Disciplinary Care Plan Details Patient Name: [name], [name]. Date of Service: 12/16/2019 12:45 PM Medical Record Number: [id number] Patient Account Number: [id number] Date of Birth/Sex: 18-Sep-1946 (75 y.o. F) Treating RN: [name] Primary Care  Zaron Zwiefelhofer: [name] Other Clinician: Referring Presleigh Feldstein: [name] Treating Ramadan [name]/Extender: [name] in Treatment: 49 Active Inactive Medication Nursing Diagnoses: Knowledge deficit related to medication safety: actual or potential Goals: Patient/caregiver will demonstrate understanding of new oral/IV medications prescribed at the [facility] (topical prescriptions are covered under the skin breakdown problem) Date Initiated: 12/16/2019 Target Resolution Date: 01/13/2020 Goal Status: Active Interventions: Assess for medication contraindications each visit where new medications are prescribed Treatment Activities: New medication prescribed at [facility] : 12/16/2019 Notes: Orientation to the Wound Care Program Nursing Diagnoses: Knowledge deficit related to the wound healing center program Goals: Patient/caregiver will verbalize understanding of the [name] Date Initiated: 12/10/2018 Target Resolution Date: 01/09/2019 Goal Status: Active Interventions: Provide education on orientation to the wound center Notes: Soft Tissue Infection Nursing Diagnoses: Impaired tissue integrity Goals: Patient's soft tissue infection will resolve Date Initiated: 12/10/2018 Target Resolution Date: 01/09/2019 [name], [name] Goal Status: Active Interventions: Assess signs and symptoms of infection every visit Notes: Venous Leg Ulcer Nursing Diagnoses: Actual venous Insuffiency (use after diagnosis is confirmed) Goals: Patient will maintain optimal edema control Date Initiated: 12/10/2018 Target Resolution Date: 01/09/2019 Goal Status: Active Interventions: Assess peripheral edema status every visit. Treatment Activities: Therapeutic compression applied : 12/10/2018 Notes: Wound/Skin Impairment Nursing Diagnoses: Impaired tissue integrity Goals: Patient/caregiver will verbalize understanding of skin care regimen Date Initiated:  12/10/2018 Target Resolution Date: 01/09/2019 Goal Status: Active Interventions: Assess ulceration(s) every visit Treatment Activities: Topical wound management initiated : 12/10/2018 Notes: Electronic Signature(s) Signed: 12/16/2019 5:56:57 PM By: [name], [name], RN, CWS, [name], [name] Entered By: [name], [name], RN, CWS, [name] on 12/16/2019 13:08:03 [name], [name] -------------------------------------------------------------------------------- Pain Assessment Details Patient Name: [name], [name]. Date of Service: 12/16/2019 12:45 PM Medical Record Number: [id number] Patient Account Number: [id number] Date of Birth/Sex: May 31, 1946 (74 y.o. F) Treating RN: [name] Primary Care Maryjayne Kleven: [name] Other Clinician: Referring Bhakti Labella: [name] Treating Quanta Robertshaw/Extender: [name] in Treatment: 45 Active Problems Location of Pain Severity and Description of Pain Patient Has Paino No Site Locations Pain Management and Medication Current Pain Management: Electronic Signature(s) Signed: 12/16/2019 3:52:22 PM By: Paulla Fore, RRT, CHT Signed: 12/16/2019 5:56:57 PM By: [name], [name], RN, CWS, [name], [name] Entered By: [name] on 12/16/2019 12:51:28 [name], RANG ([id number]) -------------------------------------------------------------------------------- Patient/Caregiver Education Details Patient Name: ALANNY, ERCOLI. Date of Service: 12/16/2019 12:45 PM Medical Record Number: [id number] Patient Account Number: [id number] Date of Birth/Gender: 11/11/1945 (74 y.o. F) Treating RN: [name] Primary Care Physician: [name] Other Clinician: Referring Physician: Ria [name] Treating Physician/Extender: [name] in Treatment: 56 Education Assessment Education Provided To: Patient Education Topics Provided Venous: Handouts: Controlling Swelling with Multilayered Compression Wraps Methods:  Demonstration, Explain/Verbal Responses: State content correctly Electronic Signature(s) Signed: 12/16/2019 5:56:57 PM By: [name], [name], RN, CWS, [name], [name] Entered By: [name], [name], RN, CWS, [name] on 12/16/2019 13:11:31 [name] -------------------------------------------------------------------------------- Wound Assessment Details Patient Name: GAZELLE, MARLO. Date of Service: 12/16/2019 12:45 PM Medical Record Number: [id number] Patient Account Number: [id number] Date of Birth/Sex: [birth year] (74 y.o. F) Treating RN: [name] Primary Care Jeremia Groot: [name] Other  Clinician: Referring Zhaire Locker: [name] Treating Hara Milholland/Extender: [name] in Treatment: 71 Wound Status Wound Number: 5 Primary Lymphedema Etiology: Wound Location: Left Lower Leg - Medial Wound Open Wounding Event: Gradually Appeared Status: Date Acquired: 11/19/2018 Comorbid Cataracts, Asthma, Sleep Apnea, Deep Vein Weeks Of Treatment: 53 History: Thrombosis, Hypertension, Peripheral Venous Clustered Wound: No Disease, Osteoarthritis, Received Chemotherapy, Received Radiation Photos Wound Measurements Length: (cm) 2 Width: (cm) 1.4 Depth: (cm) 0.2 Area: (cm) 2.199 Volume: (cm) 0.44 % Reduction in Area: 74.3% % Reduction in Volume: 48.5% Epithelialization: None Tunneling: No Undermining: No Wound Description Full Thickness Without Exposed Support Classification: Structures Wound Margin: Flat and Intact Exudate Large Amount: Exudate Type: Serosanguineous Exudate Color: red, brown Foul Odor After Cleansing: No Slough/Fibrino Yes Wound Bed Granulation Amount: Small (1-33%) Exposed Structure Granulation Quality: Pale Fascia Exposed: No Necrotic Amount: Large (67-100%) Fat Layer (Subcutaneous Tissue) Exposed: Yes Necrotic Quality: Adherent Slough Tendon Exposed: No Muscle Exposed: No Joint Exposed: No Bone Exposed: No Ashraf, Zani J.  ([id number]) Treatment Notes Wound #5 (Left, Medial Lower Leg) Notes gentamicin cream, sorbact, drawtex, 4 layer wrap with unna to anchor Electronic Signature(s) Signed: 12/16/2019 4:52:40 PM By: [name] Entered By: [name] on 12/16/2019 12:55:33 Jandreau, [name] -------------------------------------------------------------------------------- Wound Assessment Details Patient Name: KHALILA, MISKA. Date of Service: 12/16/2019 12:45 PM Medical Record Number: [id number] Patient Account Number: [id number] Date of Birth/Sex: 09/23/1946 (74 y.o. F) Treating RN: [name] Primary Care Minetta Krisher: [name] Other Clinician: Referring Sujay Grundman: [name] Treating Jessikah Dicker/Extender: [name] in Treatment: 6 Wound Status Wound Number: 6 Primary Venous Leg Ulcer Etiology: Wound Location: Left Lower Leg - Lateral Wound Open Wounding Event: Gradually Appeared Status: Date Acquired: 01/19/2019 Comorbid Cataracts, Asthma, Sleep Apnea, Deep Vein Weeks Of Treatment: 47 History: Thrombosis, Hypertension, Peripheral Venous Clustered Wound: No Disease, Osteoarthritis, Received Chemotherapy, Received Radiation Photos Wound Measurements Length: (cm) 3.6 Width: (cm) 3.5 Depth: (cm) 0.3 Area: (cm) 9.896 Volume: (cm) 2.969 % Reduction in Area: -4398.2% % Reduction in Volume: -13395.5% Epithelialization: None Tunneling: No Undermining: No Wound Description Full Thickness Without Exposed Support Classification: Structures Wound Margin: Flat and Intact Exudate Large Amount: Exudate Type: Serosanguineous Exudate Color: red, brown Foul Odor After Cleansing: No Slough/Fibrino Yes Wound Bed Granulation Amount: Small (1-33%) Exposed Structure Granulation Quality: Pink Fascia Exposed: No Necrotic Amount: Large (67-100%) Fat Layer (Subcutaneous Tissue) Exposed: Yes Necrotic Quality: Adherent Slough Tendon Exposed: No Muscle  Exposed: No Joint Exposed: No Bone Exposed: No Rumler, Deserae J. ([id number]) Treatment Notes Wound #6 (Left, Lateral Lower Leg) Notes gentamicin cream, sorbact, drawtex, 4 layer wrap with unna to anchor Electronic Signature(s) Signed: 12/16/2019 4:52:40 PM By: [name] Entered By: [name] on 12/16/2019 12:56:21 Soter, [name] -------------------------------------------------------------------------------- Wound Assessment Details Patient Name: ADERINSOLA, DOLEZAL. Date of Service: 12/16/2019 12:45 PM Medical Record Number: [id number] Patient Account Number: [id number] Date of Birth/Sex: [birth year] (74 y.o. F) Treating RN: [name] Primary Care Emre Stock: [name] Other Clinician: Referring Taheera Thomann: [name] Treating Rakwon Letourneau/Extender: [name] in Treatment: 58 Wound Status Wound Number: 9 Primary Venous Leg Ulcer Etiology: Wound Location: Left, Lateral, Posterior Lower Leg Wound Open Wounding Event: Gradually Appeared Status: Date Acquired: 07/08/2019 Comorbid Cataracts, Asthma, Sleep Apnea, Deep Vein Weeks Of Treatment: 23 History: Thrombosis, Hypertension, Peripheral Venous Clustered Wound: No Disease, Osteoarthritis, Received Chemotherapy, Received Radiation Photos Wound Measurements Length: (cm) 0.6 Width: (cm) 0.5 Depth: (cm) 0.1 Area: (cm) 0.236 Volume: (cm) 0.024 % Reduction in Area: 58.2% %  Reduction in Volume: 57.9% Epithelialization: None Tunneling: No Undermining: No Wound Description Full Thickness Without Exposed Support Classification: Structures Wound Margin: Flat and Intact Exudate Large Amount: Exudate Type: Serosanguineous Exudate Color: red, brown Foul Odor After Cleansing: No Slough/Fibrino Yes Wound Bed Granulation Amount: Small (1-33%) Exposed Structure Granulation Quality: Pink Fascia Exposed: No Necrotic Amount: Large (67-100%) Fat Layer (Subcutaneous Tissue) Exposed:  Yes Necrotic Quality: Adherent Slough Tendon Exposed: No Muscle Exposed: No Joint Exposed: No Bone Exposed: No AVELYN, BLUNCK ([id number]) Treatment Notes Wound #9 (Left, Lateral, Posterior Lower Leg) Notes gentamicin cream, sorbact, drawtex, 4 layer wrap with unna to anchor Electronic Signature(s) Signed: 12/16/2019 4:52:40 PM By: [name] Entered By: [name] on 12/16/2019 12:58:05 Mcgaughey, [name] -------------------------------------------------------------------------------- Vitals Details Patient Name: KIMYRA, PRESSNALL. Date of Service: 12/16/2019 12:45 PM Medical Record Number: [id number] Patient Account Number: [id number] Date of Birth/Sex: 1946/07/11 (74 y.o. F) Treating RN: [name] Primary Care Aracelia Brinson: [name] Other Clinician: Referring Raiford Fetterman: [name] Treating Lavell Ridings/Extender: [name] in Treatment: 53 Vital Signs Time Taken: 12:51 Temperature (F): 98.7 Height (in): 63 Pulse (bpm): 63 Weight (lbs): 224.7 Respiratory Rate (breaths/min): 16 Body Mass Index (BMI): 39.8 Blood Pressure (mmHg): 142/75 Reference Range: 80 - 120 mg / dl Electronic Signature(s) Signed: 12/16/2019 3:52:22 PM By: [name] RCP, RRT, CHT Entered By: [name] on 12/16/2019 12:52:54

## 2019-12-17 NOTE — Progress Notes (Signed)
[name], [name] ([id number]) Visit Report for 12/16/2019 HPI Details [name] Name: [name], [name]. Date of Service: 12/16/2019 12:45 PM Medical Record Number: [id number] [name] Account Number: [id number] Date of Birth/Sex: 06-01-1946 (74 y.o. F) Treating RN: Cornell Barman Primary Care Provider: [name] Other Clinician: Referring Provider: [name] Treating Provider/Extender: Tito Dine in Treatment: 32 History of Present Illness HPI Description: Pleasant 74 year old with history of chronic venous insufficiency. No diabetes or peripheral vascular disease. Left ABI 1.29. Questionable history of left lower extremity DVT. She developed a recurrent ulceration on her left lateral calf in December 2015, which she attributes to poor diet and subsequent lower extremity edema. She underwent endovenous laser ablation of her left greater saphenous vein in 2010. She underwent laser ablation of accessory branch of left GSV in April 2016 by Dr. Kellie Simmering at [facility]. She was previously wearing Unna boots, which she tolerated well. Tolerating 2 layer compression and cadexomer iodine. She returns to clinic for follow-up and is without new complaints. She denies any significant pain at this time. She reports persistent pain with pressure. No claudication or ischemic rest pain. No fever or chills. No drainage. READMISSION 11/13/16; this is a 74 year old woman who is not a diabetic. She is here for a review of a painful area on her left medial lower extremity. I note that she was seen here previously last year for wound I believe to be in the same area. At that time she had undergone previously a left greater saphenous vein ablation by Dr. Kellie Simmering and she had a ablation of the anterior accessory branch of the left greater saphenous vein in March 2016. Seeing that the wound actually closed over. In reviewing the history with her today the ulcer in this area has been recurrent. She  describes a biopsy of this area in 2009 that only showed stasis physiology. 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 Do not see that she has ever had arterial studies in the left leg. She tells me she has a follow-up with Dr. Kellie Simmering in roughly 10 days In any case she developed the reopening of this area roughly a month ago. On the background of this she describes rapidly increasing edema which has responded to Lasix 40 mg and metolazone 2.5 mg as well as the [name]'s lymph massage. She has been told she has both venous insufficiency and lymphedema but she cannot tolerate compression stockings 11/28/16; the [name] saw Dr. Kellie Simmering recently. Per the [name] he did arterial Dopplers in the office that did not show evidence of arterial insufficiency, per the [name] he stated "treat this like an ordinary venous ulcer". She also saw her dermatologist Dr. Ronnald Ramp who felt that this was more of a vascular ulcer. In general things are improving although she arrives today with increasing bilateral lower extremity edema with weeping a deeper fluid through the wound on the left medial leg compatible with some degree of lymphedema 12/04/16; the [name]'s wound is fully epithelialized but I don't think fully healed. We will do another week of depression with Promogran and TCA however I suspect we'll be able to discharge her next week. This is a very unusual-looking wound which was initially a figure-of-eight type wound lying on its side surrounded by petechial like hemorrhage. She  has had venous ablation on this side. She apparently does not have an arterial issue per Dr. Kellie Simmering. She saw her dermatologist thought it was "vascular". [name] is definitely going to need ongoing compression and I  talked about this with her today she will go to elastic therapy after she leaves here next week 12/11/16; the [name]'s wound is not completely closed today. She has surrounding scar tissue and in further discussion with the [name] it would appear that she had ulcers in this area in 2009 for a prolonged period of time ultimately requiring a punch biopsy of this area that only showed venous insufficiency. I did not previously pickup on this part of the history from the [name]. 12/18/16; the [name]'s wound is completely epithelialized. There is no open area here. She has significant bilateral venous insufficiency with secondary lymphedema to a mild-to-moderate degree she does not have compression stockings.. She did not say anything to me when I was in the room, she told our intake nurse that she was still having pain in this area. This isn't [name], [name] unusual recurrent small open area. She is going to go to elastic therapy to obtain compression stockings. 12/25/16; the [name]'s wound is fully epithelialized. There is no open area here. The [name] describes some continued episodic discomfort in this area medial left calf. However everything looks fine and healed here. She is been to elastic therapy and caught herself 15-20 mmHg stockings, they apparently were having trouble getting 20-30 mm stockings in her size 01/22/17; this is a [name] we discharged from the clinic a month ago. She has a recurrent open wound on her medial left calf. She had 15 mm support stockings. I told her I thought she needed 20-30 mm compression stockings. She tells me that she has been ill with hospitalization secondary to asthma and is been found to have severe hypokalemia likely secondary to a combination of Lasix and metolazone. This morning she noted blistering and leaking fluid on the posterior part of her left leg. She called our intake nurse urgently and we was saw her this afternoon. She has not  had any real discomfort here. I don't know that she's been wearing any stockings on this leg for at least 2-3 days. ABIs in this clinic were 1.21 on the right and 1.3 on the left. She is previously seen vascular surgery who does not think that there is a peripheral arterial issue. 01/30/17; [name] arrives with no open wound on the left leg. She has been to elastic therapy and obtained 20-42mmhg below knee stockings and she has one on the right leg today. READMISSION 02/19/18; this [place] is a now 74 year old [name] we've had in this clinic perhaps 3 times before. I had last looked at her from January 07 December 2016 with an area on the medial left leg. We discharged her on 12/25/16 however she had to be readmitted on 01/22/17 with a recurrence. I have in my notes that we discharged her on 20-30 mm stockings although she tells me she was only wearing support hose because she cannot get stockings on predominantly related to her cervical spine surgery/issues. She has had previous ablations done by vein and vascular in [place] including a great saphenous vein ablation on the left with an anterior accessory branch ablation I think both of these were in 2016. On one of the previous visit she had a biopsy noted 2009 that was negative. She is not felt to have an arterial issue. She is not  a diabetic. She does have a history of obstructive sleep apnea hypertension asthma as well as chronic venous insufficiency and lymphedema. On this occasion she noted 2 dry scaly patch on her left leg. She tried to put lotion on this it didn't really help. There were 2 open areas.the [name] has been seeing her primary physician from 02/05/18 through 02/14/18. She had Unna boots applied. The superior wound now on the lateral left leg has closed but she's had one wound that remains open on the lateral left leg. This is not the same spot as we dealt with in 2018. ABIs in this clinic were 1.3 bilaterally 02/26/18; [name] has a  small wound on the left lateral calf. Dimensions are down. She has chronic venous insufficiency and lymphedema. 03/05/18; small open area on the left lateral calf. Dimensions are down. Tightly adherent necrotic debris over the surface of the wound which was difficult to remove. Also the dressing [over collagen] stuck to the wound surface. This was removed with some difficulty as well. Change the primary dressing to Hydrofera Blue ready 03/12/18; small open area on the left lateral calf. Comes in with tightly adherent surface eschar as well as some adherent Hydrofera Blue. 03/19/18; open area on the left lateral calf. Again adherent surface eschar as well as some adherent Hydrofera Blue nonviable subcutaneous tissue. She complained of pain all week even with the reduction from 4-3 layer compression I put on last week. Also she had an increase in her ankle and calf measurements probably related to the same thing. 03/26/18; open area on the left lateral calf. A very small open area remains here. We used silver alginate starting last week as the Hydrofera Blue seem to stick to the wound bed. In using 4-layer compression 04/02/18; the open area in the left lateral calf at some adherent slough which I removed there is no open area here. We are able to transition her into her own compression stocking. Truthfully I think this is probably his support hose. However this does not maintain skin integrity will be limited. She cannot put over the toe compression stockings on because of neck problems hand problems etc. She is allergic to the lining layer of juxta lites. We might be forced to use extremitease stocking should this fail READMIT 11/24/2018 [name] is now a 74 year old woman who is not a diabetic. She has been in this clinic on at least 3 previous occasions largely with recurrent wounds on her left leg secondary to chronic venous insufficiency with secondary lymphedema. Her situation is complicated by  inability to get stockings on and an allergy to neoprene which is apparently a component and at least juxta lites and other stockings. As a result she really has not been wearing any stockings on her legs. She tells [place] that roughly 2 or 3 weeks ago she started noticing a stinging sensation just above her ankle on the left medial aspect. She has been diagnosed with pseudogout and she wondered whether this was what she was experiencing. She tried to dress this with something she bought at the store however subsequently it pulled skin off and now she has an open wound that is not improving. She has been using Vaseline gauze with a cover bandage. She saw her primary doctor last week who [name], [name] put an Unna boot on her. ABIs in this clinic was 1.03 on the left 2/12; the area is on the left medial ankle. Odd-looking wound with what looks to be surface epithelialization  but a multitude of small petechial openings. This clearly not closed yet. We have been using silver alginate under 3 layer compression with TCA 2/19; the wound area did not look quite as good this week. Necrotic debris over the majority of the wound surface which required debridement. She continues to have a multitude of what looked to be small petechial openings. She reminds [place] that she had a biopsy on this initially during her first outbreak in 2015 in [name] [facility]. She expresses concern about this being a possible melanoma. 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 I assured her that this does not look anything like melanoma. Except for the petechial reaction it does look like a venous insufficiency area and she certainly has evidence of this on both sides 2/26; a difficult area on the left medial ankle. The [name] clearly has chronic venous hypertension with some degree of lymphedema. The odd thing about the area is  the small petechial hemorrhages. I am not really sure how to explain this. This was present last time and this is not a compression injury. We have been using Hydrofera Blue which I changed to last week 3/4; still using Hydrofera Blue. Aggressive debridement today. She does not have known arterial issues. She has seen Dr. Kellie Simmering at [facility] vascular and and has an ablation on the left. [Anterior accessory branch of the greater saphenous]. From what I remember they did not feel she had an arterial issue. The [name] has had this area biopsied in 2009 at [facility] and by her recollection they said this was "stasis". She is also follow-up with dermatology locally who thought that this was more of a vascular issue 3/11; using Hydrofera Blue. Aggressive debridement today. She does not have an arterial issue. We are using 3 layer compression although we may need to go to 4. The [name] has been in for multiple changes to her wrap since I last saw her a week ago. She says that the area was leaking. I do not have too much more information on what was found 01/19/19 on evaluation today [name] was actually being seen for a nurse visit when unfortunately she had the area on her left lateral lower extremity as well as weeping from the right lower extremity that became apparent. Therefore we did end up actually seeing her for a full visit with myself. She is having some pain at this site as well but fortunately nothing too significant at this point. No fevers, chills, nausea, or vomiting noted at this time. 3/18-[name] is back to the clinic with the left leg venous leg ulcer, the ulcer is larger in size, has a surface that is densely adherent with fibrinous tissue, the Hydrofera Blue was used but is densely adherent and there was difficulty in removing it. The right lower extremity was also wrapped for weeping edema. [name] has a new area over the left lateral foot above the malleolus  that is small and appears to have no debris with intact surrounding skin. [name] is on increased dose of Lasix also as a means to edema management 3/25; the [name] has a nonhealing venous ulcer on the medial left leg and last week developed a smaller area on the lateral left calf. We have been using Hydrofera Blue with a contact layer. 4/1; no major change in these wounds areas. Left medial and more recently left lateral calf. I tried Iodoflex last week  to aid in debridement she did not tolerate this. She stated her pain was terrible all week. She took the top layer of the 4 layer compression off. 4/8; the [name] actually looks somewhat better in terms of her more prominent left lateral calf wound. There is some healthy looking tissue here. She is still complaining of a lot of discomfort. 4/15; [name] in a lot of pain secondary to sciatica. She is on a prednisone taper prescribed by her primary physician. She has the 2 areas one on the left medial and more recently a smaller area on the left lateral calf. Both of these just above the malleoli 4/22; her back pain is better but she still states she is very uncomfortable and now feels she is intolerant to the [employer]. No real change in the wounds we have been using Sorbact. She has been previously intolerant to Iodoflex. There is not a lot of option about what we can use to debride this wound under compression that she no doubt needs. sHe states Ultram no longer works for her pain 4/29; no major change in the wounds slightly increased depth. Surface on the original medial wound perhaps somewhat improved however the more recent area on the lateral left ankle is 100% covered in very adherent debris we have been using Sorbact. She tolerates 4 layer compression well and her edema control is a lot better. She has not had to come in for a nurse check 5/6; no major change in the condition of the wounds. She did consent to debridement today which was  done with some difficulty. Continuing Sorbact. She did not tolerate Iodoflex. She was in for a check of her compression the day after we wrapped her last week this was adjusted but nothing much was found 5/13; no major change in the condition or area of the wounds. I was able to get a fairly aggressive debridement done on the lateral left leg wound. Even using Sorbact under compression. She came back in on Friday to have the wrap changed. She says she felt uncomfortable on the lateral aspect of her ankle. She has a long history of chronic venous insufficiency including previous ablation surgery on this side. 5/20-[name] returns for wounds on left leg with both wounds covered in slough, with the lateral leg wound larger in size, she has been in 3 layer compression and felt more comfortable, she describes pain in ankle, in leg and pins and needles in foot, Heist, Signe J. ([id number]) and is about to try Pamelor for this 6/3; wounds on the left lateral and left medial leg. The area medially which is the most recent of the 2 seems to have had the largest increase in dimensions. We have been using Sorbac to try and debride the surface. She has been to see orthopedics they apparently did a plain x-ray that was indeterminant. Diagnosed her with neuropathy and they have ordered an MRI to determine if there is underlying osteomyelitis. This was not high on my thought list but I suppose it is prudent. We have advised her to make an appointment with vein and vascular in [place]. She has a history of a left greater saphenous and accessory vein ablations I wonder if there is anything else that can be done from a surgical point of view to help in these difficult refractory wounds. We have previously healed this wound on one occasion but it keeps on reopening [medial side] 6/10; deep tissue culture I did last week I think on the  left medial wound showed both moderate E. coli and moderate staph aureus  [MSSA]. She is going to require antibiotics and I have chosen Augmentin. We have been using Sorbact and we have made better looking wound surface on both sides but certainly no improvement in wound area. She was back in last Friday apparently for a dressing changes the wrap was hurting her outer left ankle. She has not managed to get a hold of vein and vascular in [place]. We are going to have to make her that appointment 6/17; [name] is tolerating the Augmentin. She had an MRI that I think was ordered by orthopedic surgeon this did not show osteomyelitis or an abscess did suggest cellulitis. We have been using Sorbact to the lateral and medial ankles. We have been trying to arrange a follow-up appointment with vein and vascular in [place] or did her original ablations. We apparently an area sent the request to vein and vascular in [facility] 6/24; [name] has completed the Augmentin. We do not yet have a vein and vascular appointment in [place]. I am not sure what the issue is here we have asked her to call tomorrow. We are using Sorbact. Making some improvements and especially the medial wound. Both surfaces however look better medial and lateral. 7/1; the [name] has been in contact with vein and vascular in [place] but has not yet received an appointment. Using Sorbact we have gradually improve the wound surface with no improvement in surface area. She is approved for Apligraf but the wound surface still is not completely viable. She has not had to come in for a dressing change 7/8; the [name] has an appointment with vein and vascular on 7/31 which is a Friday afternoon. She is concerned about getting back here for [place] to dress her wounds. I think it is important to have them goal for her venous reflux/history of ablations etc. to see if anything else can be done. She apparently tested positive for 1 of the blood tests with regards to lupus and saw a rheumatologist. He has raised  the issue of vasculitis again. I have had this thought in the past however the evidence seems overwhelming that this is a venous reflux etiology. If the rheumatologist tells me there is clinical and laboratory investigation is positive for lupus I will rethink this. 7/15; the [name]'s wound surfaces are quite a bit better. The medial area which was her original wound now has no depth although the lateral wound which was the more recent area actually appears larger. Both with viable surfaces which is indeed better. Using Sorbact. I wanted to use Apligraf on her however there is the issue of the vein and vascular appointment on 7/31 at 2:00 in the afternoon which would not allow her to get back to be rewrapped and they would no doubt remove the graft 7/22; the [name]'s wound surfaces have moderate amount of debris although generally look better. The lateral one is larger with 2 small satellite areas superiorly. We are waiting for her vein and vascular appointment on 7/31. She has been approved for Apligraf which I would like to use after th 7/29; wound surfaces have improved no debridement is required we have been using Sorbact. She sees vein and vascular on Friday with this so question of whether anything can be done to lessen the likelihood of recurrence and/or speed the healing of these areas. She is already had previous ablations. She no doubt has severe venous hypertension 8/5-[name] returns at 1 week, she  was in [employer] for 3 days by her podiatrist, we have been using so backed to the wound, she has increased pain in both the wounds on the left lower leg especially the more distal one on the lateral aspect 8/12-[name] returns at 1 week and she is agreeable to having debridement in both wounds on her left leg today. We have been using Sorbact, and vascular studies were reviewed at last visit 8/19; the [name] arrives with her wounds fairly clean and no debridement is required. We have used  Sorbact which is really done a nice job in cleaning up these very difficult wound surfaces. The [name] saw Dr. Donzetta Matters of vascular surgery on 7/31. He did not feel that there was an arterial component. He felt that her treated greater saphenous vein is adequately addressed and that the small saphenous vein did not appear to be involved significantly. She was also noted to have deep venous reflux which is not treatable. Dr. Donzetta Matters mentioned the possibility of a central obstructive component leading to reflux and he offered her central venography. She wanted to discuss this or think about it. I have urged her to go ahead with this. She has had recurrent difficult wounds in these areas which do heal but after months in the clinic. If there is anything that can be done to reduce the likelihood of this I think it is worth it. 9/2 she is still working towards getting follow-up with Dr. Donzetta Matters to schedule her CT. Things are quite a bit worse venography. I put Apligraf on 2 weeks ago on both wounds on the medial and lateral part of her left lower leg. She arrives in clinic today with 3 superficial additional wounds above the area laterally and one below the wound medially. She describes a lot of discomfort. I think these are probably wrapped injuries. Does not look like she has cellulitis. 07/20/2019 on evaluation today [name] appears to be doing somewhat poorly in regard to her lower extremity ulcers. She in fact showed signs of erythema in fact we may even be dealing with an infection at this time. Unfortunately I am unsure if this [name], [name] is just infection or if indeed there may be some allergic reaction that occurred as a result of the Apligraf application. With that being said that would be unusual but nonetheless not impossible in this [name] is one who is unfortunately allergic to quite a bit. Currently we have been using the Sorbact which seems to do as well as anything for her. I do  think we may want to obtain a culture today to see if there is anything showing up there that may need to be addressed. 9/16; noted that last week the wounds look worse in 1 week follow-up of the Apligraf. Using Sorbact as of 2 days ago. She arrives with copious amounts of drainage and new skin breakdown on the back of the left calf. The wounds arm more substantial bilaterally. There is a fair amount of swelling in the left calf no overt DVT there is edema present I think in the left greater than right thigh. She is supposed to go on 9/28 for CT venography. The wounds on the medial and lateral calf are worse and she has new skin breakdown posteriorly at least new for me. This is almost developing into a circumferential wound area The Apligraf was taken off last week which I agree with things are not going in the right direction a culture was done we  do not have that back yet. She is on Augmentin that she started 2 days ago 9/23; dressing was changed by her nurses on Monday. In general there is no improvement in the wound areas although the area looks less angry than last week. She did get Augmentin for MSSA cultured on the 14th. She still appears to have too much swelling in the left leg even with 3 layer compression 9/30; the [name] underwent her procedure on 9/28 by Dr. Donzetta Matters at [facility]. She was discovered to have the common iliac vein measuring 12.2 mm but at the level of L4-L5 measured 3 mm. After stenting it measured 10 mm. 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 Rouleaux flow in the common femoral and femoral vein was observed much improved after stenting. We are using silver alginate to the wounds on the medial and lateral ankle on the left. 4 layer compression 10/7; the [name] had fluid swelling around her knee and 4 layer compression. At the advice of vein and vascular this was reduced to 3 layer which she is tolerating better. We have been using  silver alginate under 3 layer compression since last Friday 10/14; arrives with the areas on the left ankle looking a lot better. Inflammation in the area also a lot better. She came in for a nurse check on 10/9 10/21; continued nice improvement. Slight improvements in surface area of both the medial and lateral wounds on the left. A lot of the satellite lesions in the weeping erythema around these from stasis dermatitis is resolved. We have been using silver alginate 10/28; general improvement in the entire wound areas although not a lot of change in dimensions the wound certainly looks better. There is a lot less in terms of venous inflammation. Continue silver alginate this week however look towards Hydrofera Blue next week 11/4; very adherent debris on the medial wound left wound is not as bad. We have been using silver alginate. Change to [facility] today 11/11; very adherent debris on both wound areas. She went to vein and vascular last week and follow-up they put in Media boot on this today. He says the [facility] was adherent. Wound is definitely not as good as last week. Especially on the left there the satellite lesions look more prominent 11/18; absolutely no better. erythema on lateral aspect with tenderness. 09/30/2019 on evaluation today [name] appears to actually be doing better. Dr. Dellia Nims did put her on doxycycline last week which I do believe has helped her at this point. Fortunately there is no signs of active infection at this time. No fevers, chills, nausea, vomiting, or diarrhea. I do believe he may want extend the doxycycline for 7 additional days just to ensure everything does completely cleared up the [name] is in agreement with that plan. Otherwise she is going require some sharp debridement today 12/2; [name] is completing a 2-week course of doxycycline. I gave her this empirically for inflammation as well as infection when I last saw her 2 weeks ago. All of  this seems to be better. She is using silver alginate she has the area on the medial aspect of the larger area laterally and the 2 small satellite regions laterally above the major wound. 12/9; the [name]'s wound on the left medial and left lateral calf look really quite good. We have been using silver alginate. She saw vein and vascular in follow-up on 10/09/2019. She has had a previous left greater saphenous vein ablation by  Dr. Oscar La in 2016. More recently she underwent a left common iliac vein stent by Dr. 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 The swelling is improved and certainly the wounds have improved. The [name] shows [place] today area on the right medial calf there is almost no wound but leaking lymphedema. She says she start this started 3 or 4 days ago. She did not traumatize it. It is not painful. She does not wear compression on that side 12/16; the [name] continues to do well laterally. Medially still requiring debridement. The area on the right calf did not materialize to anything and is not currently open. We wrapped this last time. She has support stockings for that leg although I am not sure they are going to provide adequate compression 12/23; the lateral wound looks stable. Medially still requiring debridement for tightly adherent fibrinous debris. We've been [name], [name] using silver alginate. Surface area not any different 12/30; neither wound is any better with regards to surface and the area on the left lateral is larger. I been using silver alginate to the left lateral which look quite good last week and Sorbact to the left medial 11/11/2019. Lateral wound area actually looks better and somewhat smaller. Medial still requires a very aggressive debridement today. We have been using Sorbact on both wound areas 1/13; not much better still adherent debris bilaterally. I been using Sorbact. She has severe venous hypertension. Probably some degree of  dermal fibrosis distally. I wonder whether tighter compression might help and I am going to try that today. We also need to work on the bioburden 1/20; using Sorbact. She has severe venous hypertension status post stent placement for pelvic vein compression. We applied gentamicin last time to see if we could reduce bioburden I had some discussion with her today about the use of pentoxifylline. This is occasionally used in this setting for wounds with refractory venous insufficiency. However this interacts with Plavix. She tells me that she was put on this after stent placement for 3 months. She will call Dr. Claretha Cooper office to discuss 1/27; we are using gentamicin under Sorbact. 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 She has a stent. Wound medially is measuring smaller this week. Laterally measuring slightly larger although she has some satellite lesions superiorly 2/3; gentamicin under Sorbact under 4-layer compression. 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 She has a stent on Plavix. Her wounds are measuring smaller this week. More substantially laterally where there is a satellite lesion superiorly. 2/10; gentamicin under Sorbac. 4-layer compression. [name] communicated with Dr. Donzetta Matters at vein and vascular in [place]. He is okay with the [name] coming off Plavix I will therefore start her on pentoxifylline for a 1 month trial. In general her wounds look better today. I had some concerns about swelling in the left thigh however she measures 61.5 on the right and 63 on the mid thigh which does not suggest there is any difficulty. The [name] is not describing any pain. Electronic Signature(s) Signed: 12/16/2019 5:43:36 PM By: Linton Ham MD Entered By: Linton Ham on 12/16/2019 13:17:04 [name], [name] -------------------------------------------------------------------------------- Physical Exam Details [name]  Name: KASLYN, HENNESSEY. Date of Service: 12/16/2019 12:45 PM Medical Record Number: [id number] [name] Account Number: [id number] Date of Birth/Sex: [birth year] (74 y.o. F) Treating RN: Cornell Barman Primary Care Provider: [name] Other Clinician: Referring Provider: [name] Treating Provider/Extender: Tito Dine in Treatment: 50 Constitutional Sitting or standing Blood  Pressure is within target range for [name].. Pulse regular and within target range for [name].Marland Kitchen Respirations regular, non-labored and within target range.. Temperature is normal and within the target range for the [name].Marland Kitchen appears in no distress. Notes Wound exam; both areas on the lateral and medial are measuring smaller. On the lateral there is the small satellite lesions superiorly there is also looks better. No debridement on either side Electronic Signature(s) Signed: 12/16/2019 5:43:36 PM By: Linton Ham MD Entered By: Linton Ham on 12/16/2019 13:19:25 [name], [name] -------------------------------------------------------------------------------- Physician Orders Details [name] Name: DANIELE, RHO. Date of Service: 12/16/2019 12:45 PM Medical Record Number: [id number] [name] Account Number: [id number] Date of Birth/Sex: Oct 04, 1946 (74 y.o. F) Treating RN: Cornell Barman Primary Care Provider: [name] Other Clinician: Referring Provider: [name] Treating Provider/Extender: Tito Dine in Treatment: 45 Verbal / Phone Orders: No Diagnosis Coding Wound Cleansing Wound #5 Left,Medial Lower Leg o Cleanse wound with mild soap and water Wound #6 Left,Lateral Lower Leg o Cleanse wound with mild soap and water Wound #9 Left,Lateral,Posterior Lower Leg o Cleanse wound with mild soap and water Anesthetic (add to Medication List) Wound #5 Left,Medial Lower Leg o Topical Lidocaine 4% cream applied to wound bed prior to debridement  (In Clinic Only). Wound #6 Left,Lateral Lower Leg o Topical Lidocaine 4% cream applied to wound bed prior to debridement (In Clinic Only). Wound #9 Left,Lateral,Posterior Lower Leg o Topical Lidocaine 4% cream applied to wound bed prior to debridement (In Clinic Only). Skin Barriers/Peri-Wound Care Wound #5 Left,Medial Lower Leg o Other: - Gentamycin Wound #6 Left,Lateral Lower Leg o Other: - Gentamycin Wound #9 Left,Lateral,Posterior Lower Leg o Other: - Gentamycin Primary Wound Dressing Wound #5 Left,Medial Lower Leg o Other: - sorbact Wound #6 Left,Lateral Lower Leg o Other: - sorbact Wound #9 Left,Lateral,Posterior Lower Leg o Other: - sorbact Secondary Dressing Wound #5 Left,Medial Lower Leg o Drawtex ELANAH, PRESTON. ([id number]) Wound #6 Left,Lateral Lower Leg o Drawtex Wound #9 Left,Lateral,Posterior Lower Leg o Drawtex Dressing Change Frequency Wound #5 Left,Medial Lower Leg o Change dressing every week o Other: - as needed. Wound #6 Left,Lateral Lower Leg o Change dressing every week o Other: - as needed. Wound #9 Left,Lateral,Posterior Lower Leg o Change dressing every week o Other: - as needed. Follow-up Appointments Wound #5 Left,Medial Lower Leg o Return Appointment in 1 week. o Nurse Visit as needed - Call of needed Wound #6 Left,Lateral Lower Leg o Return Appointment in 1 week. o Nurse Visit as needed - Call of needed Wound #9 Left,Lateral,Posterior Lower Leg o Return Appointment in 1 week. o Nurse Visit as needed - Call of needed Edema Control Wound #5 Left,Medial Lower Leg o 4-Layer Compression System - Left Lower Extremity. o Elevate legs to the level of the heart and pump ankles as often as possible Wound #6 Left,Lateral Lower Leg o 4-Layer Compression System - Left Lower Extremity. o Elevate legs to the level of the heart and pump ankles as often as possible Wound #9 Left,Lateral,Posterior  Lower Leg o 4-Layer Compression System - Left Lower Extremity. o Elevate legs to the level of the heart and pump ankles as often as possible Medications-please add to medication list. Wound #5 Left,Medial Lower Leg o Other: - Pentoxifylline Wound #6 Left,Lateral Lower Leg o Other: - Pentoxifylline Wound #9 Left,Lateral,Posterior Lower Leg o Other: - Pentoxifylline [name], [name] ([id number]) [name] Medications Allergies: Voltaren, Sulfa (Sulfonamide Antibiotics), latex, Neoprene Notifications Medication Indication Start End pentoxifylline 12/16/2019 DOSE oral  400 mg tablet extended release - 1 tablet extended release oral tid for 2 weeks Electronic Signature(s) Signed: 12/16/2019 1:23:20 PM By: Linton Ham MD Entered By: Linton Ham on 12/16/2019 13:23:19 [name], [name] -------------------------------------------------------------------------------- Problem List Details [name] Name: [name], [name]. Date of Service: 12/16/2019 12:45 PM Medical Record Number: [id number] [name] Account Number: [id number] Date of Birth/Sex: 1945/12/10 (74 y.o. 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 Date of Service: 12/16/2019 Medical Record Number: [id number] [name] Account Number: [id number] Date of Birth/Sex: 04-22-1946 (74 y.o. F) Treating RN: Cornell Barman Primary Care Provider: [name] Other Clinician: Referring Provider: [name] Treating Provider/Extender: Tito Dine in Treatment: 53 Diagnosis Coding ICD-10 Codes Code Description [phone] Non-pressure chronic ulcer of left calf limited to breakdown of skin I87.321 Chronic venous hypertension (idiopathic) with inflammation of right lower extremity I89.0 Lymphedema, not elsewhere classified L97.818 Non-pressure chronic ulcer of other part of right lower leg with other specified severity Facility Procedures CPT4 Code: [id number] Description: (Facility Use Only) [id number] - [place] [facility] LEG Modifier: Quantity: 1 Electronic Signature(s) Signed: 12/16/2019 5:43:36 PM By: Linton Ham MD Signed: 12/16/2019 5:56:57 PM By: [name], [name], RN, CWS, [name], [name] Entered By: [name], [name], RN, CWS, Kim on 12/16/2019 13:11:15

## 2019-12-19 ENCOUNTER — Other Ambulatory Visit: Payer: Self-pay | Admitting: Family Medicine

## 2019-12-23 ENCOUNTER — Other Ambulatory Visit: Payer: Self-pay

## 2019-12-23 ENCOUNTER — Encounter: Payer: Medicare Other | Admitting: Internal Medicine

## 2019-12-23 DIAGNOSIS — I87332 Chronic venous hypertension (idiopathic) with ulcer and inflammation of left lower extremity: Secondary | ICD-10-CM | POA: Diagnosis not present

## 2019-12-23 DIAGNOSIS — L97222 Non-pressure chronic ulcer of left calf with fat layer exposed: Secondary | ICD-10-CM | POA: Diagnosis not present

## 2019-12-23 DIAGNOSIS — I87312 Chronic venous hypertension (idiopathic) with ulcer of left lower extremity: Secondary | ICD-10-CM | POA: Diagnosis not present

## 2019-12-23 DIAGNOSIS — I872 Venous insufficiency (chronic) (peripheral): Secondary | ICD-10-CM | POA: Diagnosis not present

## 2019-12-23 DIAGNOSIS — I890 Lymphedema, not elsewhere classified: Secondary | ICD-10-CM | POA: Diagnosis not present

## 2019-12-23 DIAGNOSIS — I10 Essential (primary) hypertension: Secondary | ICD-10-CM | POA: Diagnosis not present

## 2019-12-23 DIAGNOSIS — L97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d: Secondary | ICD-10-CM | POA: Diagnosis not present

## 2019-12-23 DIAGNOSIS — L97221 Non-pressure chronic ulcer of left calf limited to breakdown of skin: Secondary | ICD-10-CM | POA: Diagnosis not present

## 2019-12-23 DIAGNOSIS — J45909 Unspecified asthma, uncomplicated: Secondary | ICD-10-CM | POA: Diagnosis not present

## 2019-12-23 NOTE — Progress Notes (Signed)
[name], [name] Visit Report for 12/23/2019 Debridement Details Patient Name: [name], [name]. Date of Service: 12/23/2019 12:45 PM Medical Record Number: [id number] Patient Account Number: [id number] Date of Birth/Sex: 31-Oct-1946 (74 y.o. F) Treating RN: Cornell Barman Primary Care Provider: [name] Other Clinician: Referring Provider: [name] Treating Provider/Extender: Tito Dine in Treatment: 15 Debridement Performed for Wound #5 Left,Medial Lower Leg Assessment: Performed By: Physician [name] Dillon, MD Debridement Type: Debridement Level of Consciousness (Pre- Awake and Alert procedure): Pre-procedure Verification/Time Yes - 13:11 Out Taken: Start Time: 13:11 Pain Control: Lidocaine 4% Topical Solution Total Area Debrided (L x W): 2.3 (cm) x 2.2 (cm) = 5.06 (cm) Tissue and other material Viable, Non-Viable, Slough, Subcutaneous, Slough debrided: Level: Skin/Subcutaneous Tissue Debridement Description: Excisional Instrument: Curette Bleeding: Minimum Hemostasis Achieved: Pressure End Time: 13:13 Procedural Pain: 0 Post Procedural Pain: 0 Response to Treatment: Procedure was tolerated well Level of Consciousness Awake and Alert (Post-procedure): Post Debridement Measurements of Total Wound Length: (cm) 2.3 Width: (cm) 2.2 Depth: (cm) 0.3 Volume: (cm) 1.192 Character of Wound/Ulcer Post Debridement: Improved Post Procedure Diagnosis Same as Pre-procedure Electronic Signature(s) Signed: 12/23/2019 4:30:10 PM By: Linton Ham MD Signed: 12/23/2019 5:27:48 PM By: [name], [name], RN, CWS, [name], [name] Entered By: Linton Ham on 12/23/2019 13:19:23 [name] -------------------------------------------------------------------------------- Debridement Details Patient Name: [name], [name]. Date of Service: 12/23/2019 12:45 PM Medical Record Number: [id number] Patient Account Number: [id number] Date of  Birth/Sex: [birth year] (74 y.o. F) Treating RN: Cornell Barman Primary Care Provider: [name] Other Clinician: Referring Provider: [name] Treating Provider/Extender: Tito Dine in Treatment: 80 Debridement Performed for Wound #6 Left,Lateral Lower Leg Assessment: Performed By: Physician [name] Dillon, MD Debridement Type: Debridement Severity of Tissue Pre Fat layer exposed Debridement: Level of Consciousness (Pre- Awake and Alert procedure): Pre-procedure Verification/Time Yes - 13:13 Out Taken: Start Time: 13:13 Pain Control: Lidocaine 4% Topical Solution Total Area Debrided (L x W): 3.6 (cm) x 2.5 (cm) = 9 (cm) Tissue and other material Viable, Non-Viable, Slough, Subcutaneous, Slough debrided: Level: Skin/Subcutaneous Tissue Debridement Description: Excisional Instrument: Curette Bleeding: Minimum Hemostasis Achieved: Pressure End Time: 13:15 Procedural Pain: 0 Post Procedural Pain: 0 Response to Treatment: Procedure was tolerated well Level of Consciousness Awake and Alert (Post-procedure): Post Debridement Measurements of Total Wound Length: (cm) 3.6 Width: (cm) 2.5 Depth: (cm) 0.3 Volume: (cm) 2.121 Character of Wound/Ulcer Post Debridement: Improved Severity of Tissue Post Debridement: Fat layer exposed Post Procedure Diagnosis Same as Pre-procedure Electronic Signature(s) Signed: 12/23/2019 4:30:10 PM By: Linton Ham MD Signed: 12/23/2019 5:27:48 PM By: [name], [name], RN, CWS, [name], [name] Entered By: Linton Ham on 12/23/2019 13:19:34 [name] -------------------------------------------------------------------------------- HPI Details Patient Name: [name], [name]. Date of Service: 12/23/2019 12:45 PM Medical Record Number: [id number] Patient Account Number: [id number] Date of Birth/Sex: 1945-11-26 (74 y.o. F) Treating RN: Cornell Barman Primary Care Provider: [name] Other  Clinician: Referring Provider: [name] Treating Provider/Extender: Tito Dine in Treatment: 65 History of Present Illness HPI Description: Pleasant 74 year old with history of chronic venous insufficiency. No diabetes or peripheral vascular disease. Left ABI 1.29. Questionable history of left lower extremity DVT. She developed a recurrent ulceration on her left lateral calf in December 2015, which she attributes to poor diet and subsequent lower extremity edema. She underwent endovenous laser ablation of her left greater saphenous vein in 2010. She underwent laser ablation of accessory branch of left GSV in April 2016 by Dr.  Kellie Simmering at [facility]. She was previously wearing Unna boots, which she tolerated well. Tolerating 2 layer compression and cadexomer iodine. She returns to clinic for follow-up and is without new complaints. She denies any significant pain at this time. She reports persistent pain with pressure. No claudication or ischemic rest pain. No fever or chills. No drainage. READMISSION 11/13/16; this is a 74 year old woman who is not a diabetic. She is here for a review of a painful area on her left medial lower extremity. I note that she was seen here previously last year for wound I believe to be in the same area. At that time she had undergone previously a left greater saphenous vein ablation by Dr. Kellie Simmering and she had a ablation of the anterior accessory branch of the left greater saphenous vein in March 2016. Seeing that the wound actually closed over. In reviewing the history with her today the ulcer in this area has been recurrent. She describes a biopsy of this area in 2009 that only showed stasis physiology. 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 Lutricia Feil of oncology and in August of this year she had surgery for cervical spinal stenosis which left her with an improving Horner's syndrome on  the left eye. Do not see that she has ever had arterial studies in the left leg. She tells me she has a follow-up with Dr. Kellie Simmering in roughly 10 days In any case she developed the reopening of this area roughly a month ago. On the background of this she describes rapidly increasing edema which has responded to Lasix 40 mg and metolazone 2.5 mg as well as the patient's lymph massage. She has been told she has both venous insufficiency and lymphedema but she cannot tolerate compression stockings 11/28/16; the patient saw Dr. Kellie Simmering recently. Per the patient he did arterial Dopplers in the office that did not show evidence of arterial insufficiency, per the patient he stated "treat this like an ordinary venous ulcer". She also saw her dermatologist Dr. Ronnald Ramp who felt that this was more of a vascular ulcer. In general things are improving although she arrives today with increasing bilateral lower extremity edema with weeping a deeper fluid through the wound on the left medial leg compatible with some degree of lymphedema 12/04/16; the patient's wound is fully epithelialized but I don't think fully healed. We will do another week of depression with Promogran and TCA however I suspect we'll be able to discharge her next week. This is a very unusual-looking wound which was initially a figure-of-eight type wound lying on its side surrounded by petechial like hemorrhage. She has had venous ablation on this side. She apparently does not have an arterial issue per Dr. Kellie Simmering. She saw her dermatologist thought it was "vascular". Patient is definitely going to need ongoing compression and I talked about this with her today she will go to elastic therapy after she leaves here next week 12/11/16; the patient's wound is not completely closed today. She has surrounding scar tissue and in further discussion with the patient it would appear that she had ulcers in this area in 2009 for a prolonged period of time ultimately  requiring a punch biopsy of this area that only showed venous insufficiency. I did not previously pickup on this part of the history from the patient. 12/18/16; the patient's wound is completely epithelialized. There is no open area here. She has significant bilateral venous insufficiency with secondary  lymphedema to a mild-to-moderate degree she does not have compression stockings.. She did not say anything to me when I was in the room, she told our intake nurse that she was still having pain in this area. This isn't unusual recurrent small open area. She is going to go to elastic therapy to obtain compression stockings. 12/25/16; the patient's wound is fully epithelialized. There is no open area here. The patient describes some continued episodic discomfort in this area medial left calf. However everything looks fine and healed here. She is been to elastic therapy and caught herself 15-20 mmHg stockings, they apparently were having trouble getting 20-30 mm stockings in her size [name], [name] 01/22/17; this is a patient we discharged from the clinic a month ago. She has a recurrent open wound on her medial left calf. She had 15 mm support stockings. I told her I thought she needed 20-30 mm compression stockings. She tells me that she has been ill with hospitalization secondary to asthma and is been found to have severe hypokalemia likely secondary to a combination of Lasix and metolazone. This morning she noted blistering and leaking fluid on the posterior part of her left leg. She called our intake nurse urgently and we was saw her this afternoon. She has not had any real discomfort here. I don't know that she's been wearing any stockings on this leg for at least 2-3 days. ABIs in this clinic were 1.21 on the right and 1.3 on the left. She is previously seen vascular surgery who does not think that there is a peripheral arterial issue. 01/30/17; Patient arrives with no open wound on  the left leg. She has been to elastic therapy and obtained 20-46mmhg below knee stockings and she has one on the right leg today. READMISSION 02/19/18; this Grimshaw is a now 74 year old patient we've had in this clinic perhaps 3 times before. I had last looked at her from January 07 December 2016 with an area on the medial left leg. We discharged her on 12/25/16 however she had to be readmitted on 01/22/17 with a recurrence. I have in my notes that we discharged her on 20-30 mm stockings although she tells me she was only wearing support hose because she cannot get stockings on predominantly related to her cervical spine surgery/issues. She has had previous ablations done by vein and vascular in [place] including a great saphenous vein ablation on the left with an anterior accessory branch ablation I think both of these were in 2016. On one of the previous visit she had a biopsy noted 2009 that was negative. She is not felt to have an arterial issue. She is not a diabetic. She does have a history of obstructive sleep apnea hypertension asthma as well as chronic venous insufficiency and lymphedema. On this occasion she noted 2 dry scaly patch on her left leg. She tried to put lotion on this it didn't really help. There were 2 open areas.the patient has been seeing her primary physician from 02/05/18 through 02/14/18. She had Unna boots applied. The superior wound now on the lateral left leg has closed but she's had one wound that remains open on the lateral left leg. This is not the same spot as we dealt with in 2018. ABIs in this clinic were 1.3 bilaterally 02/26/18; patient has a small wound on the left lateral calf. Dimensions are down. She has chronic venous insufficiency and lymphedema. 03/05/18; small open area on the left lateral calf. Dimensions are down.  Tightly adherent necrotic debris over the surface of the wound which was difficult to remove. Also the dressing [over collagen] stuck to the  wound surface. This was removed with some difficulty as well. Change the primary dressing to Hydrofera Blue ready 03/12/18; small open area on the left lateral calf. Comes in with tightly adherent surface eschar as well as some adherent Hydrofera Blue. 03/19/18; open area on the left lateral calf. Again adherent surface eschar as well as some adherent Hydrofera Blue nonviable subcutaneous tissue. She complained of pain all week even with the reduction from 4-3 layer compression I put on last week. Also she had an increase in her ankle and calf measurements probably related to the same thing. 03/26/18; open area on the left lateral calf. A very small open area remains here. We used silver alginate starting last week as the Hydrofera Blue seem to stick to the wound bed. In using 4-layer compression 04/02/18; the open area in the left lateral calf at some adherent slough which I removed there is no open area here. We are able to transition her into her own compression stocking. Truthfully I think this is probably his support hose. However this does not maintain skin integrity will be limited. She cannot put over the toe compression stockings on because of neck problems hand problems etc. She is allergic to the lining layer of juxta lites. We might be forced to use extremitease stocking should this fail READMIT 11/24/2018 Patient is now a 74 year old woman who is not a diabetic. She has been in this clinic on at least 3 previous occasions largely with recurrent wounds on her left leg secondary to chronic venous insufficiency with secondary lymphedema. Her situation is complicated by inability to get stockings on and an allergy to neoprene which is apparently a component and at least juxta lites and other stockings. As a result she really has not been wearing any stockings on her legs. She tells [place] that roughly 2 or 3 weeks ago she started noticing a stinging sensation just above her ankle on the left  medial aspect. She has been diagnosed with pseudogout and she wondered whether this was what she was experiencing. She tried to dress this with something she bought at the store however subsequently it pulled skin off and now she has an open wound that is not improving. She has been using Vaseline gauze with a cover bandage. She saw her primary doctor last week who put an Haematologist on her. ABIs in this clinic was 1.03 on the left [name], [name]. ([id number]) 2/12; the area is on the left medial ankle. Odd-looking wound with what looks to be surface epithelialization but a multitude of small petechial openings. This clearly not closed yet. We have been using silver alginate under 3 layer compression with TCA 2/19; the wound area did not look quite as good this week. Necrotic debris over the majority of the wound surface which required debridement. She continues to have a multitude of what looked to be small petechial openings. She reminds [place] that she had a biopsy on this initially during her first outbreak in 2015 in [place] dermatology. She expresses concern about this being a possible melanoma. 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 I assured her that this does not look anything like melanoma. Except for the petechial reaction it does look like a venous insufficiency area and she certainly has evidence of  this on both sides 2/26; a difficult area on the left medial ankle. The patient clearly has chronic venous hypertension with some degree of lymphedema. The odd thing about the area is the small petechial hemorrhages. I am not really sure how to explain this. This was present last time and this is not a compression injury. We have been using Hydrofera Blue which I changed to last week 3/4; still using Hydrofera Blue. Aggressive debridement today. She does not have known arterial issues. She has seen  Dr. Kellie Simmering at [facility] vein and vascular and and has an ablation on the left. [Anterior accessory branch of the greater saphenous]. From what I remember they did not feel she had an arterial issue. The patient has had this area biopsied in 2009 at [facility] dermatology and by her recollection they said this was "stasis". She is also follow-up with dermatology locally who thought that this was more of a vascular issue 3/11; using Hydrofera Blue. Aggressive debridement today. She does not have an arterial issue. We are using 3 layer compression although we may need to go to 4. The patient has been in for multiple changes to her wrap since I last saw her a week ago. She says that the area was leaking. I do not have too much more information on what was found 01/19/19 on evaluation today patient was actually being seen for a nurse visit when unfortunately she had the area on her left lateral lower extremity as well as weeping from the right lower extremity that became apparent. Therefore we did end up actually seeing her for a full visit with myself. She is having some pain at this site as well but fortunately nothing too significant at this point. No fevers, chills, nausea, or vomiting noted at this time. 3/18-Patient is back to the clinic with the left leg venous leg ulcer, the ulcer is larger in size, has a surface that is densely adherent with fibrinous tissue, the Hydrofera Blue was used but is densely adherent and there was difficulty in removing it. The right lower extremity was also wrapped for weeping edema. Patient has a new area over the left lateral foot above the malleolus that is small and appears to have no debris with intact surrounding skin. Patient is on increased dose of Lasix also as a means to edema management 3/25; the patient has a nonhealing venous ulcer on the medial left leg and last week developed a smaller area on the lateral left calf. We have been using Hydrofera Blue with  a contact layer. 4/1; no major change in these wounds areas. Left medial and more recently left lateral calf. I tried Iodoflex last week to aid in debridement she did not tolerate this. She stated her pain was terrible all week. She took the top layer of the 4 layer compression off. 4/8; the patient actually looks somewhat better in terms of her more prominent left lateral calf wound. There is some healthy looking tissue here. She is still complaining of a lot of discomfort. 4/15; patient in a lot of pain secondary to sciatica. She is on a prednisone taper prescribed by her primary physician. She has the 2 areas one on the left medial and more recently a smaller area on the left lateral calf. Both of these just above the malleoli 4/22; her back pain is better but she still states she is very uncomfortable and now feels she is intolerant to the [employer]. No real change in the wounds we have  been using Sorbact. She has been previously intolerant to Iodoflex. There is not a lot of option about what we can use to debride this wound under compression that she no doubt needs. sHe states Ultram no longer works for her pain 4/29; no major change in the wounds slightly increased depth. Surface on the original medial wound perhaps somewhat improved however the more recent area on the lateral left ankle is 100% covered in very adherent debris we have been using Sorbact. She tolerates 4 layer compression well and her edema control is a lot better. She has not had to come in for a nurse check 5/6; no major change in the condition of the wounds. She did consent to debridement today which was done with some difficulty. Continuing Sorbact. She did not tolerate Iodoflex. She was in for a check of her compression the day after we wrapped her last week this was adjusted but nothing much was found 5/13; no major change in the condition or area of the wounds. I was able to get a fairly aggressive debridement done on  the lateral left leg wound. Even using Sorbact under compression. She came back in on Friday to have the wrap changed. She says she felt uncomfortable on the lateral aspect of her ankle. She has a long history of chronic venous insufficiency including previous ablation surgery on this side. 5/20-Patient returns for wounds on left leg with both wounds covered in slough, with the lateral leg wound larger in size, she has been in 3 layer compression and felt more comfortable, she describes pain in ankle, in leg and pins and needles in foot, and is about to try Pamelor for this 6/3; wounds on the left lateral and left medial leg. The area medially which is the most recent of the 2 seems to have had the largest increase in dimensions. We have been using Sorbac to try and debride the surface. She has been to see orthopedics they apparently did a plain x-ray that was indeterminant. Diagnosed her with neuropathy and they have ordered an MRI to [name], [name]. ([id number]) determine if there is underlying osteomyelitis. This was not high on my thought list but I suppose it is prudent. We have advised her to make an appointment with vein and vascular in [place]. She has a history of a left greater saphenous and accessory vein ablations I wonder if there is anything else that can be done from a surgical point of view to help in these difficult refractory wounds. We have previously healed this wound on one occasion but it keeps on reopening [medial side] 6/10; deep tissue culture I did last week I think on the left medial wound showed both moderate E. coli and moderate staph aureus [MSSA]. She is going to require antibiotics and I have chosen Augmentin. We have been using Sorbact and we have made better looking wound surface on both sides but certainly no improvement in wound area. She was back in last Friday apparently for a dressing changes the wrap was hurting her outer left ankle. She has not managed to  get a hold of vein and vascular in [place]. We are going to have to make her that appointment 6/17; patient is tolerating the Augmentin. She had an MRI that I think was ordered by orthopedic surgeon this did not show osteomyelitis or an abscess did suggest cellulitis. We have been using Sorbact to the lateral and medial ankles. We have been trying to arrange a follow-up appointment with vein  and vascular in [place] or did her original ablations. We apparently an area sent the request to vein and vascular in [facility] 6/24; patient has completed the Augmentin. We do not yet have a vein and vascular appointment in [place]. I am not sure what the issue is here we have asked her to call tomorrow. We are using Sorbact. Making some improvements and especially the medial wound. Both surfaces however look better medial and lateral. 7/1; the patient has been in contact with vein and vascular in [place] but has not yet received an appointment. Using Sorbact we have gradually improve the wound surface with no improvement in surface area. She is approved for Apligraf but the wound surface still is not completely viable. She has not had to come in for a dressing change 7/8; the patient has an appointment with vein and vascular on 7/31 which is a Friday afternoon. She is concerned about getting back here for [place] to dress her wounds. I think it is important to have them goal for her venous reflux/history of ablations etc. to see if anything else can be done. She apparently tested positive for 1 of the blood tests with regards to lupus and saw a rheumatologist. He has raised the issue of vasculitis again. I have had this thought in the past however the evidence seems overwhelming that this is a venous reflux etiology. If the rheumatologist tells me there is clinical and laboratory investigation is positive for lupus I will rethink this. 7/15; the patient's wound surfaces are quite a bit better. The  medial area which was her original wound now has no depth although the lateral wound which was the more recent area actually appears larger. Both with viable surfaces which is indeed better. Using Sorbact. I wanted to use Apligraf on her however there is the issue of the vein and vascular appointment on 7/31 at 2:00 in the afternoon which would not allow her to get back to be rewrapped and they would no doubt remove the graft 7/22; the patient's wound surfaces have moderate amount of debris although generally look better. The lateral one is larger with 2 small satellite areas superiorly. We are waiting for her vein and vascular appointment on 7/31. She has been approved for Apligraf which I would like to use after th 7/29; wound surfaces have improved no debridement is required we have been using Sorbact. She sees vein and vascular on Friday with this so question of whether anything can be done to lessen the likelihood of recurrence and/or speed the healing of these areas. She is already had previous ablations. She no doubt has severe venous hypertension 8/5-Patient returns at 1 week, she was in [place] for 3 days by her podiatrist, we have been using so backed to the wound, she has increased pain in both the wounds on the left lower leg especially the more distal one on the lateral aspect 8/12-Patient returns at 1 week and she is agreeable to having debridement in both wounds on her left leg today. We have been using Sorbact, and vascular studies were reviewed at last visit 8/19; the patient arrives with her wounds fairly clean and no debridement is required. We have used Sorbact which is really done a nice job in cleaning up these very difficult wound surfaces. The patient saw Dr. Donzetta Matters of vascular surgery on 7/31. He did not feel that there was an arterial component. He felt that her treated greater saphenous vein is adequately addressed and that the small  saphenous vein did not appear to be  involved significantly. She was also noted to have deep venous reflux which is not treatable. Dr. Donzetta Matters mentioned the possibility of a central obstructive component leading to reflux and he offered her central venography. She wanted to discuss this or think about it. I have urged her to go ahead with this. She has had recurrent difficult wounds in these areas which do heal but after months in the clinic. If there is anything that can be done to reduce the likelihood of this I think it is worth it. 9/2 she is still working towards getting follow-up with Dr. Donzetta Matters to schedule her CT. Things are quite a bit worse venography. I put Apligraf on 2 weeks ago on both wounds on the medial and lateral part of her left lower leg. She arrives in clinic today with 3 superficial additional wounds above the area laterally and one below the wound medially. She describes a lot of discomfort. I think these are probably wrapped injuries. Does not look like she has cellulitis. 07/20/2019 on evaluation today patient appears to be doing somewhat poorly in regard to her lower extremity ulcers. She in fact showed signs of erythema in fact we may even be dealing with an infection at this time. Unfortunately I am unsure if this is just infection or if indeed there may be some allergic reaction that occurred as a result of the Apligraf application. With that being said that would be unusual but nonetheless not impossible in this patient is one who is unfortunately allergic to quite a bit. Currently we have been using the Sorbact which seems to do as well as anything for her. I do think we may want to obtain a culture today to see if there is anything showing up there that may need to be addressed. [name], [name] 9/16; noted that last week the wounds look worse in 1 week follow-up of the Apligraf. Using Sorbact as of 2 days ago. She arrives with copious amounts of drainage and new skin breakdown on the back of the  left calf. The wounds arm more substantial bilaterally. There is a fair amount of swelling in the left calf no overt DVT there is edema present I think in the left greater than right thigh. She is supposed to go on 9/28 for CT venography. The wounds on the medial and lateral calf are worse and she has new skin breakdown posteriorly at least new for me. This is almost developing into a circumferential wound area The Apligraf was taken off last week which I agree with things are not going in the right direction a culture was done we do not have that back yet. She is on Augmentin that she started 2 days ago 9/23; dressing was changed by her nurses on Monday. In general there is no improvement in the wound areas although the area looks less angry than last week. She did get Augmentin for MSSA cultured on the 14th. She still appears to have too much swelling in the left leg even with 3 layer compression 9/30; the patient underwent her procedure on 9/28 by Dr. Donzetta Matters at [facility]. She was discovered to have the common iliac vein measuring 12.2 mm but at the level of L4-L5 measured 3 mm. After stenting it measured 10 mm. 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 Rouleaux flow in the common femoral and femoral vein was observed much improved after stenting. We are using  silver alginate to the wounds on the medial and lateral ankle on the left. 4 layer compression 10/7; the patient had fluid swelling around her knee and 4 layer compression. At the advice of vein and vascular this was reduced to 3 layer which she is tolerating better. We have been using silver alginate under 3 layer compression since last Friday 10/14; arrives with the areas on the left ankle looking a lot better. Inflammation in the area also a lot better. She came in for a nurse check on 10/9 10/21; continued nice improvement. Slight improvements in surface area of both the medial and lateral wounds on  the left. A lot of the satellite lesions in the weeping erythema around these from stasis dermatitis is resolved. We have been using silver alginate 10/28; general improvement in the entire wound areas although not a lot of change in dimensions the wound certainly looks better. There is a lot less in terms of venous inflammation. Continue silver alginate this week however look towards Hydrofera Blue next week 11/4; very adherent debris on the medial wound left wound is not as bad. We have been using silver alginate. Change to [facility] today 11/11; very adherent debris on both wound areas. She went to vein and vascular last week and follow-up they put in [place] boot on this today. He says the [facility] was adherent. Wound is definitely not as good as last week. Especially on the left there the satellite lesions look more prominent 11/18; absolutely no better. erythema on lateral aspect with tenderness. 09/30/2019 on evaluation today patient appears to actually be doing better. Dr. Dellia Nims did put her on doxycycline last week which I do believe has helped her at this point. Fortunately there is no signs of active infection at this time. No fevers, chills, nausea, vomiting, or diarrhea. I do believe he may want extend the doxycycline for 7 additional days just to ensure everything does completely cleared up the patient is in agreement with that plan. Otherwise she is going require some sharp debridement today 12/2; patient is completing a 2-week course of doxycycline. I gave her this empirically for inflammation as well as infection when I last saw her 2 weeks ago. All of this seems to be better. She is using silver alginate she has the area on the medial aspect of the larger area laterally and the 2 small satellite regions laterally above the major wound. 12/9; the patient's wound on the left medial and left lateral calf look really quite good. We have been using silver alginate. She saw  vein and vascular in follow-up on 10/09/2019. She has had a previous left greater saphenous vein ablation by Dr. Oscar La in 2016. More recently she underwent a left common iliac vein stent by Dr. 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 The swelling is improved and certainly the wounds have improved. The patient shows [place] today area on the right medial calf there is almost no wound but leaking lymphedema. She says she start this started 3 or 4 days ago. She did not traumatize it. It is not painful. She does not wear compression on that side 12/16; the patient continues to do well laterally. Medially still requiring debridement. The area on the right calf did not materialize to anything and is not currently open. We wrapped this last time. She has support stockings for that leg although I am not sure they are going to provide adequate compression 12/23; the lateral wound looks stable. Medially still  requiring debridement for tightly adherent fibrinous debris. We've been using silver alginate. Surface area not any different 12/30; neither wound is any better with regards to surface and the area on the left lateral is larger. I been using silver alginate to the left lateral which look quite good last week and Sorbact to the left medial 11/11/2019. Lateral wound area actually looks better and somewhat smaller. Medial still requires a very aggressive debridement [name], [name] today. We have been using Sorbact on both wound areas 1/13; not much better still adherent debris bilaterally. I been using Sorbact. She has severe venous hypertension. Probably some degree of dermal fibrosis distally. I wonder whether tighter compression might help and I am going to try that today. We also need to work on the bioburden 1/20; using Sorbact. She has severe venous hypertension status post stent placement for pelvic vein compression. We applied gentamicin last time to see if we could reduce  bioburden I had some discussion with her today about the use of pentoxifylline. This is occasionally used in this setting for wounds with refractory venous insufficiency. However this interacts with Plavix. She tells me that she was put on this after stent placement for 3 months. She will call Dr. Claretha Cooper office to discuss 1/27; we are using gentamicin under Sorbact. 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 She has a stent. Wound medially is measuring smaller this week. Laterally measuring slightly larger although she has some satellite lesions superiorly 2/3; gentamicin under Sorbact under 4-layer compression. 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 She has a stent on Plavix. Her wounds are measuring smaller this week. More substantially laterally where there is a satellite lesion superiorly. 2/10; gentamicin under Sorbac. 4-layer compression. Patient communicated with Dr. Donzetta Matters at vein and vascular in [place]. He is okay with the patient coming off Plavix I will therefore start her on pentoxifylline for a 1 month trial. In general her wounds look better today. I had some concerns about swelling in the left thigh however she measures 61.5 on the right and 63 on the mid thigh which does not suggest there is any difficulty. The patient is not describing any pain. 2/17; gentamicin under Sorbac 4-layer compression. She has been on pentoxifylline for 1 week and complains of loose stool. No nausea she is eating and drinking well Electronic Signature(s) Signed: 12/23/2019 4:30:10 PM By: Linton Ham MD Entered By: Linton Ham on 12/23/2019 13:23:50 Prescher, [name] -------------------------------------------------------------------------------- Physical Exam Details Patient Name: [name], DUPRIEST. Date of Service: 12/23/2019 12:45 PM Medical Record Number: [id number] Patient Account Number: [id number] Date of Birth/Sex:  06-15-1946 (74 y.o. F) Treating RN: Cornell Barman Primary Care Provider: [name] Other Clinician: Referring Provider: [name] Treating Provider/Extender: Tito Dine in Treatment: 51 Constitutional Patient is hypertensive.. Pulse regular and within target range for patient.Marland Kitchen Respirations regular, non-labored and within target range.. Temperature is normal and within the target range for the patient.Marland Kitchen appears in no distress. Notes Wound exam; both areas on the lateral medial are about the same in terms of measurements although the surfaces look better. Still some nonviable debris although the debridement is a lot easier and post debridement the wound bed looks healthier. Hemostasis with direct pressure removing subcutaneous tissue Electronic Signature(s) Signed: 12/23/2019 4:30:10 PM By: Linton Ham MD Entered By: Linton Ham on 12/23/2019 13:24:39 Henrickson, [name] -------------------------------------------------------------------------------- Physician Orders Details Patient Name: HENRIETTE, LANDINI. Date of Service: 12/23/2019 12:45 PM Medical Record  Number: [id number] Patient Account Number: [id number] Date of Birth/Sex: 11/07/1945 (74 y.o. F) Treating RN: Montey Hora Primary Care Provider: [name] Other Clinician: Referring Provider: [name] Treating Provider/Extender: Tito Dine in Treatment: 78 Verbal / Phone Orders: No Diagnosis Coding Wound Cleansing Wound #5 Left,Medial Lower Leg o Cleanse wound with mild soap and water Wound #6 Left,Lateral Lower Leg o Cleanse wound with mild soap and water Wound #9 Left,Lateral,Posterior Lower Leg o Cleanse wound with mild soap and water Anesthetic (add to Medication List) Wound #5 Left,Medial Lower Leg o Topical Lidocaine 4% cream applied to wound bed prior to debridement (In Clinic Only). Wound #6 Left,Lateral Lower Leg o Topical Lidocaine 4%  cream applied to wound bed prior to debridement (In Clinic Only). Wound #9 Left,Lateral,Posterior Lower Leg o Topical Lidocaine 4% cream applied to wound bed prior to debridement (In Clinic Only). Skin Barriers/Peri-Wound Care Wound #5 Left,Medial Lower Leg o Other: - Gentamycin Wound #6 Left,Lateral Lower Leg o Other: - Gentamycin Wound #9 Left,Lateral,Posterior Lower Leg o Other: - Gentamycin Primary Wound Dressing Wound #5 Left,Medial Lower Leg o Other: - sorbact Wound #6 Left,Lateral Lower Leg o Other: - sorbact Wound #9 Left,Lateral,Posterior Lower Leg o Other: - sorbact Secondary Dressing Wound #5 Left,Medial Lower Leg o Drawtex RAVON, RENNEY. ([id number]) Wound #6 Left,Lateral Lower Leg o Drawtex Wound #9 Left,Lateral,Posterior Lower Leg o Drawtex Dressing Change Frequency Wound #5 Left,Medial Lower Leg o Change dressing every week o Other: - as needed. Wound #6 Left,Lateral Lower Leg o Change dressing every week o Other: - as needed. Wound #9 Left,Lateral,Posterior Lower Leg o Change dressing every week o Other: - as needed. Follow-up Appointments Wound #5 Left,Medial Lower Leg o Return Appointment in 1 week. o Nurse Visit as needed - Call of needed Wound #6 Left,Lateral Lower Leg o Return Appointment in 1 week. o Nurse Visit as needed - Call of needed Wound #9 Left,Lateral,Posterior Lower Leg o Return Appointment in 1 week. o Nurse Visit as needed - Call of needed Edema Control Wound #5 Left,Medial Lower Leg o 4-Layer Compression System - Left Lower Extremity. o Elevate legs to the level of the heart and pump ankles as often as possible Wound #6 Left,Lateral Lower Leg o 4-Layer Compression System - Left Lower Extremity. o Elevate legs to the level of the heart and pump ankles as often as possible Wound #9 Left,Lateral,Posterior Lower Leg o 4-Layer Compression System - Left Lower Extremity. o  Elevate legs to the level of the heart and pump ankles as often as possible Medications-please add to medication list. Wound #5 Left,Medial Lower Leg o Other: - Pentoxifylline Wound #6 Left,Lateral Lower Leg o Other: - Pentoxifylline Wound #9 Left,Lateral,Posterior Lower Leg o Other: - Pentoxifylline DONNEISHA, LUMMIS ([id number]) Electronic Signature(s) Signed: 12/23/2019 4:30:10 PM By: Linton Ham MD Signed: 12/23/2019 4:43:49 PM By: Montey Hora Entered By: Montey Hora on 12/23/2019 13:15:06 Shorts, [name] -------------------------------------------------------------------------------- Problem List Details Patient Name: DONALD, CURTISS. Date of Service: 12/23/2019 12:45 PM Medical Record Number: [id number] Patient Account Number: [id number] Date of Birth/Sex: 24-Feb-1946 (74 y.o. F) Treating RN: Cornell Barman Primary Care Provider: [name] Other Clinician: Referring Provider: [name] Treating Provider/Extender: Tito Dine in Treatment: 31 Active Problems ICD-10 Evaluated Encounter Code Description Active Date Today Diagnosis L97.221 Non-pressure chronic ulcer of left calf limited to breakdown of 01/07/2019 No Yes skin I87.332 Chronic venous hypertension (idiopathic) with ulcer and 12/09/2019 No Yes inflammation of left lower extremity I89.0 Lymphedema,  not elsewhere classified 12/10/2018 No Yes I82.592 Chronic embolism and thrombosis of other specified deep 12/09/2019 No Yes vein of left lower extremity Inactive Problems ICD-10 Code Description Active Date Inactive Date L97.818 Non-pressure chronic ulcer of other part of right lower leg with other 10/14/2019 10/14/2019 specified severity Resolved Problems Electronic Signature(s) Signed: 12/23/2019 4:30:10 PM By: Linton Ham MD Entered By: Linton Ham on 12/23/2019 13:19:05 Buzzelli, [name]  ([id number]) -------------------------------------------------------------------------------- Progress Note Details Patient Name: LADEJA, FORTNA. Date of Service: 12/23/2019 12:45 PM Medical Record Number: [id number] Patient Account Number: [id number] Date of Birth/Sex: 1946/04/15 (74 y.o. F) Treating RN: Cornell Barman Primary Care Provider: [name] Other Clinician: Referring Provider: [name] Treating Provider/Extender: Tito Dine in Treatment: 16 Subjective History of Present Illness (HPI) Pleasant 74 year old with history of chronic venous insufficiency. No diabetes or peripheral vascular disease. Left ABI 1.29. Questionable history of left lower extremity DVT. She developed a recurrent ulceration on her left lateral calf in December 2015, which she attributes to poor diet and subsequent lower extremity edema. She underwent endovenous laser ablation of her left greater saphenous vein in 2010. She underwent laser ablation of accessory branch of left GSV in April 2016 by Dr. Kellie Simmering at [facility]. She was previously wearing [place] boots, which she tolerated well. Tolerating 2 layer compression and cadexomer iodine. She returns to clinic for follow-up and is without new complaints. She denies any significant pain at this time. She reports persistent pain with pressure. No claudication or ischemic rest pain. No fever or chills. No drainage. READMISSION 11/13/16; this is a 74 year old woman who is not a diabetic. She is here for a review of a painful area on her left medial lower extremity. I note that she was seen here previously last year for wound I believe to be in the same area. At that time she had undergone previously a left greater saphenous vein ablation by Dr. Kellie Simmering and she had a ablation of the anterior accessory branch of the left greater saphenous vein in March 2016. Seeing that the wound actually closed over. In reviewing the history with her today the  ulcer in this area has been recurrent. She describes a biopsy of this area in 2009 that only showed stasis physiology. 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 Do not see that she has ever had arterial studies in the left leg. She tells me she has a follow-up with Dr. Kellie Simmering in roughly 10 days In any case she developed the reopening of this area roughly a month ago. On the background of this she describes rapidly increasing edema which has responded to Lasix 40 mg and metolazone 2.5 mg as well as the patient's lymph massage. She has been told she has both venous insufficiency and lymphedema but she cannot tolerate compression stockings 11/28/16; the patient saw Dr. Kellie Simmering recently. Per the patient he did arterial Dopplers in the office that did not show evidence of arterial insufficiency, per the patient he stated "treat this like an ordinary venous ulcer". She also saw her dermatologist Dr. Ronnald Ramp who felt that this was more of a vascular ulcer. In general things are improving although she arrives today with increasing bilateral lower extremity edema with weeping a deeper fluid through the wound on the  left medial leg compatible with some degree of lymphedema 12/04/16; the patient's wound is fully epithelialized but I don't think fully healed. We will do another week of depression with Promogran and TCA however I suspect we'll be able to discharge her next week. This is a very unusual-looking wound which was initially a figure-of-eight type wound lying on its side surrounded by petechial like hemorrhage. She has had venous ablation on this side. She apparently does not have an arterial issue per Dr. Kellie Simmering. She saw her dermatologist thought it was "vascular". Patient is definitely  going to need ongoing compression and I talked about this with her today she will go to elastic therapy after she leaves here next week 12/11/16; the patient's wound is not completely closed today. She has surrounding scar tissue and in further discussion with the patient it would appear that she had ulcers in this area in 2009 for a prolonged period of time ultimately requiring a punch biopsy of this area that only showed venous insufficiency. I did not previously pickup on this part of the history from the patient. 12/18/16; the patient's wound is completely epithelialized. There is no open area here. She has significant bilateral venous insufficiency with secondary lymphedema to a mild-to-moderate degree she does not have compression stockings.. She did not say anything to me when I was in the room, she told our intake nurse that she was still having pain in this area. This isn't unusual recurrent small open area. She is going to go to elastic therapy to obtain compression stockings. 12/25/16; the patient's wound is fully epithelialized. There is no open area here. The patient describes some continued episodic discomfort in this area medial left calf. However everything looks fine and healed here. She is been to elastic therapy and NAHLA, BARGMAN ([id number]) caught herself 15-20 mmHg stockings, they apparently were having trouble getting 20-30 mm stockings in her size 01/22/17; this is a patient we discharged from the clinic a month ago. She has a recurrent open wound on her medial left calf. She had 15 mm support stockings. I told her I thought she needed 20-30 mm compression stockings. She tells me that she has been ill with hospitalization secondary to asthma and is been found to have severe hypokalemia likely secondary to a combination of Lasix and metolazone. This morning she noted blistering and leaking fluid on the posterior part of her left leg. She called our intake nurse urgently and we  was saw her this afternoon. She has not had any real discomfort here. I don't know that she's been wearing any stockings on this leg for at least 2-3 days. ABIs in this clinic were 1.21 on the right and 1.3 on the left. She is previously seen vascular surgery who does not think that there is a peripheral arterial issue. 01/30/17; Patient arrives with no open wound on the left leg. She has been to elastic therapy and obtained 20-41mmhg below knee stockings and she has one on the right leg today. READMISSION 02/19/18; this Avena is a now 74 year old patient we've had in this clinic perhaps 3 times before. I had last looked at her from January 07 December 2016 with an area on the medial left leg. We discharged her on 12/25/16 however she had to be readmitted on 01/22/17 with a recurrence. I have in my notes that we discharged her on 20-30 mm stockings although she tells me she was only wearing support hose because she cannot get stockings on predominantly related  to her cervical spine surgery/issues. She has had previous ablations done by vein and vascular in [place] including a great saphenous vein ablation on the left with an anterior accessory branch ablation I think both of these were in 2016. On one of the previous visit she had a biopsy noted 2009 that was negative. She is not felt to have an arterial issue. She is not a diabetic. She does have a history of obstructive sleep apnea hypertension asthma as well as chronic venous insufficiency and lymphedema. On this occasion she noted 2 dry scaly patch on her left leg. She tried to put lotion on this it didn't really help. There were 2 open areas.the patient has been seeing her primary physician from 02/05/18 through 02/14/18. She had Unna boots applied. The superior wound now on the lateral left leg has closed but she's had one wound that remains open on the lateral left leg. This is not the same spot as we dealt with in 2018. ABIs in this clinic were  1.3 bilaterally 02/26/18; patient has a small wound on the left lateral calf. Dimensions are down. She has chronic venous insufficiency and lymphedema. 03/05/18; small open area on the left lateral calf. Dimensions are down. Tightly adherent necrotic debris over the surface of the wound which was difficult to remove. Also the dressing [over collagen] stuck to the wound surface. This was removed with some difficulty as well. Change the primary dressing to Hydrofera Blue ready 03/12/18; small open area on the left lateral calf. Comes in with tightly adherent surface eschar as well as some adherent Hydrofera Blue. 03/19/18; open area on the left lateral calf. Again adherent surface eschar as well as some adherent Hydrofera Blue nonviable subcutaneous tissue. She complained of pain all week even with the reduction from 4-3 layer compression I put on last week. Also she had an increase in her ankle and calf measurements probably related to the same thing. 03/26/18; open area on the left lateral calf. A very small open area remains here. We used silver alginate starting last week as the Hydrofera Blue seem to stick to the wound bed. In using 4-layer compression 04/02/18; the open area in the left lateral calf at some adherent slough which I removed there is no open area here. We are able to transition her into her own compression stocking. Truthfully I think this is probably his support hose. However this does not maintain skin integrity will be limited. She cannot put over the toe compression stockings on because of neck problems hand problems etc. She is allergic to the lining layer of juxta lites. We might be forced to use extremitease stocking should this fail READMIT 11/24/2018 Patient is now a 74 year old woman who is not a diabetic. She has been in this clinic on at least 3 previous occasions largely with recurrent wounds on her left leg secondary to chronic venous insufficiency with secondary  lymphedema. Her situation is complicated by inability to get stockings on and an allergy to neoprene which is apparently a component and at least juxta lites and other stockings. As a result she really has not been wearing any stockings on her legs. She tells [place] that roughly 2 or 3 weeks ago she started noticing a stinging sensation just above her ankle on the left medial aspect. She has been diagnosed with pseudogout and she wondered whether this was what she was experiencing. She tried to dress this with something she bought at the store however subsequently it pulled skin  off and now she has an open wound that is not improving. She has been using Vaseline gauze with a cover bandage. She saw her primary doctor last week who put an Haematologist on her. ABIs in this clinic was 1.03 on the left AUBREA, DELUCCIA. ([id number]) 2/12; the area is on the left medial ankle. Odd-looking wound with what looks to be surface epithelialization but a multitude of small petechial openings. This clearly not closed yet. We have been using silver alginate under 3 layer compression with TCA 2/19; the wound area did not look quite as good this week. Necrotic debris over the majority of the wound surface which required debridement. She continues to have a multitude of what looked to be small petechial openings. She reminds [place] that she had a biopsy on this initially during her first outbreak in 2015 in [place] [facility]. She expresses concern about this being a possible melanoma. 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 I assured her that this does not look anything like melanoma. Except for the petechial reaction it does look like a venous insufficiency area and she certainly has evidence of this on both sides 2/26; a difficult area on the left medial ankle. The patient clearly has chronic venous hypertension with some degree  of lymphedema. The odd thing about the area is the small petechial hemorrhages. I am not really sure how to explain this. This was present last time and this is not a compression injury. We have been using Hydrofera Blue which I changed to last week 3/4; still using Hydrofera Blue. Aggressive debridement today. She does not have known arterial issues. She has seen Dr. Kellie Simmering at [facility] vein and vascular and and has an ablation on the left. [Anterior accessory branch of the greater saphenous]. From what I remember they did not feel she had an arterial issue. The patient has had this area biopsied in 2009 at [facility] dermatology and by her recollection they said this was "stasis". She is also follow-up with dermatology locally who thought that this was more of a vascular issue 3/11; using Hydrofera Blue. Aggressive debridement today. She does not have an arterial issue. We are using 3 layer compression although we may need to go to 4. The patient has been in for multiple changes to her wrap since I last saw her a week ago. She says that the area was leaking. I do not have too much more information on what was found 01/19/19 on evaluation today patient was actually being seen for a nurse visit when unfortunately she had the area on her left lateral lower extremity as well as weeping from the right lower extremity that became apparent. Therefore we did end up actually seeing her for a full visit with myself. She is having some pain at this site as well but fortunately nothing too significant at this point. No fevers, chills, nausea, or vomiting noted at this time. 3/18-Patient is back to the clinic with the left leg venous leg ulcer, the ulcer is larger in size, has a surface that is densely adherent with fibrinous tissue, the Hydrofera Blue was used but is densely adherent and there was difficulty in removing it. The right lower extremity was also wrapped for weeping edema. Patient has a new area  over the left lateral foot above the malleolus that is small and appears to have no debris with intact surrounding skin. Patient is on  increased dose of Lasix also as a means to edema management 3/25; the patient has a nonhealing venous ulcer on the medial left leg and last week developed a smaller area on the lateral left calf. We have been using Hydrofera Blue with a contact layer. 4/1; no major change in these wounds areas. Left medial and more recently left lateral calf. I tried Iodoflex last week to aid in debridement she did not tolerate this. She stated her pain was terrible all week. She took the top layer of the 4 layer compression off. 4/8; the patient actually looks somewhat better in terms of her more prominent left lateral calf wound. There is some healthy looking tissue here. She is still complaining of a lot of discomfort. 4/15; patient in a lot of pain secondary to sciatica. She is on a prednisone taper prescribed by her primary physician. She has the 2 areas one on the left medial and more recently a smaller area on the left lateral calf. Both of these just above the malleoli 4/22; her back pain is better but she still states she is very uncomfortable and now feels she is intolerant to the [employer]. No real change in the wounds we have been using Sorbact. She has been previously intolerant to Iodoflex. There is not a lot of option about what we can use to debride this wound under compression that she no doubt needs. sHe states Ultram no longer works for her pain 4/29; no major change in the wounds slightly increased depth. Surface on the original medial wound perhaps somewhat improved however the more recent area on the lateral left ankle is 100% covered in very adherent debris we have been using Sorbact. She tolerates 4 layer compression well and her edema control is a lot better. She has not had to come in for a nurse check 5/6; no major change in the condition of the wounds.  She did consent to debridement today which was done with some difficulty. Continuing Sorbact. She did not tolerate Iodoflex. She was in for a check of her compression the day after we wrapped her last week this was adjusted but nothing much was found 5/13; no major change in the condition or area of the wounds. I was able to get a fairly aggressive debridement done on the lateral left leg wound. Even using Sorbact under compression. She came back in on Friday to have the wrap changed. She says she felt uncomfortable on the lateral aspect of her ankle. She has a long history of chronic venous insufficiency including previous ablation surgery on this side. 5/20-Patient returns for wounds on left leg with both wounds covered in slough, with the lateral leg wound larger in size, she has been in 3 layer compression and felt more comfortable, she describes pain in ankle, in leg and pins and needles in foot, and is about to try Pamelor for this 6/3; wounds on the left lateral and left medial leg. The area medially which is the most recent of the 2 seems to have had the largest increase in dimensions. We have been using Sorbac to try and debride the surface. She has been to see orthopedics ASHARA, PORTZ ([id number]) they apparently did a plain x-ray that was indeterminant. Diagnosed her with neuropathy and they have ordered an MRI to determine if there is underlying osteomyelitis. This was not high on my thought list but I suppose it is prudent. We have advised her to make an appointment with vein and vascular  in [place]. She has a history of a left greater saphenous and accessory vein ablations I wonder if there is anything else that can be done from a surgical point of view to help in these difficult refractory wounds. We have previously healed this wound on one occasion but it keeps on reopening [medial side] 6/10; deep tissue culture I did last week I think on the left medial wound showed both  moderate E. coli and moderate staph aureus [MSSA]. She is going to require antibiotics and I have chosen Augmentin. We have been using Sorbact and we have made better looking wound surface on both sides but certainly no improvement in wound area. She was back in last Friday apparently for a dressing changes the wrap was hurting her outer left ankle. She has not managed to get a hold of vein and vascular in [place]. We are going to have to make her that appointment 6/17; patient is tolerating the Augmentin. She had an MRI that I think was ordered by orthopedic surgeon this did not show osteomyelitis or an abscess did suggest cellulitis. We have been using Sorbact to the lateral and medial ankles. We have been trying to arrange a follow-up appointment with vein and vascular in [place] or did her original ablations. We apparently an area sent the request to vein and vascular in [facility] 6/24; patient has completed the Augmentin. We do not yet have a vein and vascular appointment in [place]. I am not sure what the issue is here we have asked her to call tomorrow. We are using Sorbact. Making some improvements and especially the medial wound. Both surfaces however look better medial and lateral. 7/1; the patient has been in contact with vein and vascular in [place] but has not yet received an appointment. Using Sorbact we have gradually improve the wound surface with no improvement in surface area. She is approved for Apligraf but the wound surface still is not completely viable. She has not had to come in for a dressing change 7/8; the patient has an appointment with vein and vascular on 7/31 which is a Friday afternoon. She is concerned about getting back here for [place] to dress her wounds. I think it is important to have them goal for her venous reflux/history of ablations etc. to see if anything else can be done. She apparently tested positive for 1 of the blood tests with regards to  lupus and saw a rheumatologist. He has raised the issue of vasculitis again. I have had this thought in the past however the evidence seems overwhelming that this is a venous reflux etiology. If the rheumatologist tells me there is clinical and laboratory investigation is positive for lupus I will rethink this. 7/15; the patient's wound surfaces are quite a bit better. The medial area which was her original wound now has no depth although the lateral wound which was the more recent area actually appears larger. Both with viable surfaces which is indeed better. Using Sorbact. I wanted to use Apligraf on her however there is the issue of the vein and vascular appointment on 7/31 at 2:00 in the afternoon which would not allow her to get back to be rewrapped and they would no doubt remove the graft 7/22; the patient's wound surfaces have moderate amount of debris although generally look better. The lateral one is larger with 2 small satellite areas superiorly. We are waiting for her vein and vascular appointment on 7/31. She has been approved for Apligraf which I would like  to use after th 7/29; wound surfaces have improved no debridement is required we have been using Sorbact. She sees vein and vascular on Friday with this so question of whether anything can be done to lessen the likelihood of recurrence and/or speed the healing of these areas. She is already had previous ablations. She no doubt has severe venous hypertension 8/5-Patient returns at 1 week, she was in [place] for 3 days by her podiatrist, we have been using so backed to the wound, she has increased pain in both the wounds on the left lower leg especially the more distal one on the lateral aspect 8/12-Patient returns at 1 week and she is agreeable to having debridement in both wounds on her left leg today. We have been using Sorbact, and vascular studies were reviewed at last visit 8/19; the patient arrives with her wounds fairly clean  and no debridement is required. We have used Sorbact which is really done a nice job in cleaning up these very difficult wound surfaces. The patient saw Dr. Donzetta Matters of vascular surgery on 7/31. He did not feel that there was an arterial component. He felt that her treated greater saphenous vein is adequately addressed and that the small saphenous vein did not appear to be involved significantly. She was also noted to have deep venous reflux which is not treatable. Dr. Donzetta Matters mentioned the possibility of a central obstructive component leading to reflux and he offered her central venography. She wanted to discuss this or think about it. I have urged her to go ahead with this. She has had recurrent difficult wounds in these areas which do heal but after months in the clinic. If there is anything that can be done to reduce the likelihood of this I think it is worth it. 9/2 she is still working towards getting follow-up with Dr. Donzetta Matters to schedule her CT. Things are quite a bit worse venography. I put Apligraf on 2 weeks ago on both wounds on the medial and lateral part of her left lower leg. She arrives in clinic today with 3 superficial additional wounds above the area laterally and one below the wound medially. She describes a lot of discomfort. I think these are probably wrapped injuries. Does not look like she has cellulitis. 07/20/2019 on evaluation today patient appears to be doing somewhat poorly in regard to her lower extremity ulcers. She in fact showed signs of erythema in fact we may even be dealing with an infection at this time. Unfortunately I am unsure if this is just infection or if indeed there may be some allergic reaction that occurred as a result of the Apligraf application. With that being said that would be unusual but nonetheless not impossible in this patient is one who is unfortunately allergic to quite a bit. Currently we have been using the Sorbact which seems to do as well as  anything for her. I do think we may want to obtain [name], [name] a culture today to see if there is anything showing up there that may need to be addressed. 9/16; noted that last week the wounds look worse in 1 week follow-up of the Apligraf. Using Sorbact as of 2 days ago. She arrives with copious amounts of drainage and new skin breakdown on the back of the left calf. The wounds arm more substantial bilaterally. There is a fair amount of swelling in the left calf no overt DVT there is edema present I think in the left greater than  right thigh. She is supposed to go on 9/28 for CT venography. The wounds on the medial and lateral calf are worse and she has new skin breakdown posteriorly at least new for me. This is almost developing into a circumferential wound area The Apligraf was taken off last week which I agree with things are not going in the right direction a culture was done we do not have that back yet. She is on Augmentin that she started 2 days ago 9/23; dressing was changed by her nurses on Monday. In general there is no improvement in the wound areas although the area looks less angry than last week. She did get Augmentin for MSSA cultured on the 14th. She still appears to have too much swelling in the left leg even with 3 layer compression 9/30; the patient underwent her procedure on 9/28 by Dr. Donzetta Matters at vascular and vein specialist. She was discovered to have the common iliac vein measuring 12.2 mm but at the level of L4-L5 measured 3 mm. After stenting it measured 10 mm. 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 Rouleaux flow in the common femoral and femoral vein was observed much improved after stenting. We are using silver alginate to the wounds on the medial and lateral ankle on the left. 4 layer compression 10/7; the patient had fluid swelling around her knee and 4 layer compression. At the advice of vein and vascular this was reduced to 3 layer  which she is tolerating better. We have been using silver alginate under 3 layer compression since last Friday 10/14; arrives with the areas on the left ankle looking a lot better. Inflammation in the area also a lot better. She came in for a nurse check on 10/9 10/21; continued nice improvement. Slight improvements in surface area of both the medial and lateral wounds on the left. A lot of the satellite lesions in the weeping erythema around these from stasis dermatitis is resolved. We have been using silver alginate 10/28; general improvement in the entire wound areas although not a lot of change in dimensions the wound certainly looks better. There is a lot less in terms of venous inflammation. Continue silver alginate this week however look towards Hydrofera Blue next week 11/4; very adherent debris on the medial wound left wound is not as bad. We have been using silver alginate. Change to [facility] today 11/11; very adherent debris on both wound areas. She went to vein and vascular last week and follow-up they put in [place] boot on this today. He says the [facility] was adherent. Wound is definitely not as good as last week. Especially on the left there the satellite lesions look more prominent 11/18; absolutely no better. erythema on lateral aspect with tenderness. 09/30/2019 on evaluation today patient appears to actually be doing better. Dr. Dellia Nims did put her on doxycycline last week which I do believe has helped her at this point. Fortunately there is no signs of active infection at this time. No fevers, chills, nausea, vomiting, or diarrhea. I do believe he may want extend the doxycycline for 7 additional days just to ensure everything does completely cleared up the patient is in agreement with that plan. Otherwise she is going require some sharp debridement today 12/2; patient is completing a 2-week course of doxycycline. I gave her this empirically for inflammation as well as  infection when I last saw her 2 weeks ago. All of this seems to be better. She is using silver  alginate she has the area on the medial aspect of the larger area laterally and the 2 small satellite regions laterally above the major wound. 12/9; the patient's wound on the left medial and left lateral calf look really quite good. We have been using silver alginate. She saw vein and vascular in follow-up on 10/09/2019. She has had a previous left greater saphenous vein ablation by Dr. Oscar La in 2016. More recently she underwent a left common iliac vein stent by Dr. 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 The swelling is improved and certainly the wounds have improved. The patient shows [place] today area on the right medial calf there is almost no wound but leaking lymphedema. She says she start this started 3 or 4 days ago. She did not traumatize it. It is not painful. She does not wear compression on that side 12/16; the patient continues to do well laterally. Medially still requiring debridement. The area on the right calf did not materialize to anything and is not currently open. We wrapped this last time. She has support stockings for that leg although I am not sure they are going to provide adequate compression 12/23; the lateral wound looks stable. Medially still requiring debridement for tightly adherent fibrinous debris. We've been using silver alginate. Surface area not any different 12/30; neither wound is any better with regards to surface and the area on the left lateral is larger. I been using silver alginate to the left lateral which look quite good last week and Sorbact to the left medial VERIA, WEIDEMANN ([id number]) 11/11/2019. Lateral wound area actually looks better and somewhat smaller. Medial still requires a very aggressive debridement today. We have been using Sorbact on both wound areas 1/13; not much better still adherent debris bilaterally. I been using Sorbact. She has  severe venous hypertension. Probably some degree of dermal fibrosis distally. I wonder whether tighter compression might help and I am going to try that today. We also need to work on the bioburden 1/20; using Sorbact. She has severe venous hypertension status post stent placement for pelvic vein compression. We applied gentamicin last time to see if we could reduce bioburden I had some discussion with her today about the use of pentoxifylline. This is occasionally used in this setting for wounds with refractory venous insufficiency. However this interacts with Plavix. She tells me that she was put on this after stent placement for 3 months. She will call Dr. Claretha Cooper office to discuss 1/27; we are using gentamicin under Sorbact. 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 She has a stent. Wound medially is measuring smaller this week. Laterally measuring slightly larger although she has some satellite lesions superiorly 2/3; gentamicin under Sorbact under 4-layer compression. 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 She has a stent on Plavix. Her wounds are measuring smaller this week. More substantially laterally where there is a satellite lesion superiorly. 2/10; gentamicin under Sorbac. 4-layer compression. Patient communicated with Dr. Donzetta Matters at vein and vascular in [place]. He is okay with the patient coming off Plavix I will therefore start her on pentoxifylline for a 1 month trial. In general her wounds look better today. I had some concerns about swelling in the left thigh however she measures 61.5 on the right and 63 on the mid thigh which does not suggest there is any difficulty. The patient is not describing any pain. 2/17; gentamicin under Sorbac 4-layer compression. She has been on pentoxifylline for 1 week  and complains of loose stool. No nausea she is eating and drinking well Objective Constitutional Patient is hypertensive..  Pulse regular and within target range for patient.Marland Kitchen Respirations regular, non-labored and within target range.. Temperature is normal and within the target range for the patient.Marland Kitchen appears in no distress. Vitals Time Taken: 12:50 PM, Height: 63 in, Weight: 224.7 lbs, BMI: 39.8, Temperature: 98.7 F, Pulse: 77 bpm, Respiratory Rate: 16 breaths/min, Blood Pressure: 154/75 mmHg. General Notes: Wound exam; both areas on the lateral medial are about the same in terms of measurements although the surfaces look better. Still some nonviable debris although the debridement is a lot easier and post debridement the wound bed looks healthier. Hemostasis with direct pressure removing subcutaneous tissue Integumentary (Hair, Skin) Wound #5 status is Open. Original cause of wound was Gradually Appeared. The wound is located on the Left,Medial Lower Leg. The wound measures 2.3cm length x 2.2cm width x 0.2cm depth; 3.974cm^2 area and 0.795cm^3 volume. There is Fat Layer (Subcutaneous Tissue) Exposed exposed. There is no tunneling or undermining noted. There is a large amount of serosanguineous drainage noted. The wound margin is flat and intact. There is small (1-33%) pale granulation within the wound bed. There is a large (67-100%) amount of necrotic tissue within the wound bed including Adherent Slough. Wound #6 status is Open. Original cause of wound was Gradually Appeared. The wound is located on the Left,Lateral Lower Leg. The wound measures 3.6cm length x 2.5cm width x 0.2cm depth; 7.069cm^2 area and 1.414cm^3 volume. There is Fat Layer (Subcutaneous Tissue) Exposed exposed. There is no tunneling or undermining noted. There is a large amount of DUNYA, HOOTMAN. ([id number]) serosanguineous drainage noted. The wound margin is flat and intact. There is small (1-33%) pink granulation within the wound bed. There is a large (67-100%) amount of necrotic tissue within the wound bed including Adherent Slough. Wound  #9 status is Open. Original cause of wound was Gradually Appeared. The wound is located on the Left,Lateral,Posterior Lower Leg. The wound measures 0.7cm length x 0.8cm width x 0.1cm depth; 0.44cm^2 area and 0.044cm^3 volume. There is Fat Layer (Subcutaneous Tissue) Exposed exposed. There is no tunneling or undermining noted. There is a large amount of serosanguineous drainage noted. The wound margin is flat and intact. There is small (1-33%) pink granulation within the wound bed. There is a large (67-100%) amount of necrotic tissue within the wound bed including Adherent Slough. 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 There was a Excisional Skin/Subcutaneous Tissue Debridement with a total area of 5.06 sq cm performed by [name] Dillon, MD. With the following instrument(s): Curette to remove Viable and Non-Viable tissue/material. Material removed includes Subcutaneous Tissue and Slough and after achieving pain control using Lidocaine 4% Topical Solution. No specimens were taken. A time out was conducted at 13:11, prior to the start of the procedure. A Minimum amount of bleeding was controlled with Pressure. The procedure was tolerated well with a pain level of 0 throughout and a pain level of 0 following the procedure. Post Debridement Measurements: 2.3cm length x 2.2cm width x 0.3cm depth; 1.192cm^3 volume. Character of Wound/Ulcer Post Debridement is improved. Post procedure Diagnosis Wound #5: Same  as Pre-Procedure Pre-procedure diagnosis of Wound #5 is a Lymphedema located on the Left,Medial Lower Leg .  There was a Four Layer Compression Therapy Procedure with a pre-treatment ABI of 1 by Montey Hora, RN. Post procedure Diagnosis Wound #5: Same as Pre-Procedure Wound #6 Pre-procedure diagnosis of Wound #6 is a Venous Leg Ulcer located on the Left,Lateral Lower Leg .Severity of Tissue Pre Debridement is: Fat layer exposed. There was a Excisional Skin/Subcutaneous Tissue Debridement with a total area of 9 sq cm performed by [name] Dillon, MD. With the following instrument(s): Curette to remove Viable and Non-Viable tissue/material. Material removed includes Subcutaneous Tissue and Slough and after achieving pain control using Lidocaine 4% Topical Solution. No specimens were taken. A time out was conducted at 13:13, prior to the start of the procedure. A Minimum amount of bleeding was controlled with Pressure. The procedure was tolerated well with a pain level of 0 throughout and a pain level of 0 following the procedure. Post Debridement Measurements: 3.6cm length x 2.5cm width x 0.3cm depth; 2.121cm^3 volume. Character of Wound/Ulcer Post Debridement is improved. Severity of Tissue Post Debridement is: Fat layer exposed. Post procedure Diagnosis Wound #6: Same as Pre-Procedure ROREY, KNILL. ([id number]) Pre-procedure diagnosis of Wound #6 is a Venous Leg Ulcer located on the Left,Lateral Lower Leg . There was a Four Layer Compression Therapy Procedure with a pre-treatment ABI of 1 by Montey Hora, RN. Post procedure Diagnosis Wound #6: Same as Pre-Procedure Wound #9 Pre-procedure diagnosis of Wound #9 is a Venous Leg Ulcer located on the Left,Lateral,Posterior Lower Leg . There was a Four Layer Compression Therapy Procedure with a pre-treatment ABI of 1 by Montey Hora, RN. Post procedure Diagnosis Wound #9: Same as Pre-Procedure Plan Wound Cleansing: Wound #5 Left,Medial Lower Leg: Cleanse wound with mild soap and water Wound #6 Left,Lateral Lower Leg: Cleanse wound with mild  soap and water Wound #9 Left,Lateral,Posterior Lower Leg: Cleanse wound with mild soap and water Anesthetic (add to Medication List): Wound #5 Left,Medial Lower Leg: Topical Lidocaine 4% cream applied to wound bed prior to debridement (In Clinic Only). Wound #6 Left,Lateral Lower Leg: Topical Lidocaine 4% cream applied to wound bed prior to debridement (In Clinic Only). Wound #9 Left,Lateral,Posterior Lower Leg: Topical Lidocaine 4% cream applied to wound bed prior to debridement (In Clinic Only). Skin Barriers/Peri-Wound Care: Wound #5 Left,Medial Lower Leg: Other: - Gentamycin Wound #6 Left,Lateral Lower Leg: Other: - Gentamycin Wound #9 Left,Lateral,Posterior Lower Leg: Other: - Gentamycin Primary Wound Dressing: Wound #5 Left,Medial Lower Leg: Other: - sorbact Wound #6 Left,Lateral Lower Leg: Other: - sorbact Wound #9 Left,Lateral,Posterior Lower Leg: Other: - sorbact Secondary Dressing: Wound #5 Left,Medial Lower Leg: Drawtex Wound #6 Left,Lateral Lower Leg: Drawtex Wound #9 Left,Lateral,Posterior Lower Leg: Drawtex Dressing Change Frequency: Wound #5 Left,Medial Lower Leg: Change dressing every week Other: - as needed. Wound #6 Left,Lateral Lower Leg: ALEGNA, VANDERKOLK ([id number]) Change dressing every week Other: - as needed. Wound #9 Left,Lateral,Posterior Lower Leg: Change dressing every week Other: - as needed. Follow-up Appointments: Wound #5 Left,Medial Lower Leg: Return Appointment in 1 week. Nurse Visit as needed - Call of needed Wound #6 Left,Lateral Lower Leg: Return Appointment in 1 week. Nurse Visit as needed - Call of needed Wound #9 Left,Lateral,Posterior Lower Leg: Return Appointment in 1 week. Nurse Visit as needed - Call of needed Edema Control: Wound #5 Left,Medial Lower Leg: 4-Layer Compression System - Left Lower Extremity. Elevate legs to the level of the heart and pump ankles  as often as possible Wound #6 Left,Lateral Lower  Leg: 4-Layer Compression System - Left Lower Extremity. Elevate legs to the level of the heart and pump ankles as often as possible Wound #9 Left,Lateral,Posterior Lower Leg: 4-Layer Compression System - Left Lower Extremity. Elevate legs to the level of the heart and pump ankles as often as possible Medications-please add to medication list.: Wound #5 Left,Medial Lower Leg: Other: - Pentoxifylline Wound #6 Left,Lateral Lower Leg: Other: - Pentoxifylline Wound #9 Left,Lateral,Posterior Lower Leg: Other: - Pentoxifylline 1. I did not change the dressing gentamicin covered by Sorbact 2. Still under 4-layer compression 3. I asked her to continue to try to take the pentoxifylline. I could not find diarrhea listed as a specific side effect. Perhaps there is another reason for this. 4. May be close to trying to change the primary dressing if we can get the surface of the wound to have a consistently viable presentation Electronic Signature(s) Signed: 12/23/2019 4:30:10 PM By: Linton Ham MD Entered By: Linton Ham on 12/23/2019 13:25:39 Panico, [name] -------------------------------------------------------------------------------- SuperBill Details Patient Name: META, PUGLIESE. Date of Service: 12/23/2019 Medical Record Number: [id number] Patient Account Number: [id number] Date of Birth/Sex: [birth year] (74 y.o. 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 Facility  Procedures CPT4: Description Modifier Quantity Code [id number] - DEB SUBQ TISSUE 20 SQ CM/< 1 ICD-10 Diagnosis Description I87.332 Chronic venous hypertension (idiopathic) with ulcer and inflammation of left lower extremity L97.221 Non-pressure chronic ulcer of  left calf limited to breakdown of skin Physician Procedures CPT4: Description Modifier Quantity Code [id number] 11042 - WC PHYS SUBQ TISS 20 SQ CM 1 ICD-10 Diagnosis Description I87.332 Chronic venous hypertension (idiopathic) with ulcer and inflammation of left lower extremity L97.221 Non-pressure chronic ulcer of  left calf limited to breakdown of skin Electronic Signature(s) Signed: 12/23/2019 4:30:10 PM By: Linton Ham MD Entered By: Linton Ham on 12/23/2019 13:26:03

## 2019-12-23 NOTE — Progress Notes (Signed)
KRYSTYL, KIEPER ([id number]) Visit Report for 12/23/2019 Arrival Information Details Patient Name: [name], [name]. Date of Service: 12/23/2019 12:45 PM Medical Record Number: [id number] Patient Account Number: [id number] Date of Birth/Sex: Jun 12, 1946 (74 y.o. F) Treating RN: Montey Hora Primary Care Simisola Sandles: [name] Other Clinician: Referring Cirilo Canner: [name] Treating Octavious Zidek/Extender: Tito Dine in Treatment: 64 Visit Information History Since Last Visit Added or deleted any medications: No Patient Arrived: Ambulatory Any new allergies or adverse reactions: No Arrival Time: 12:43 Had a fall or experienced change in No Accompanied By: self activities of daily living that may affect Transfer Assistance: None risk of falls: Patient Identification Verified: Yes Signs or symptoms of abuse/neglect since last visito No Secondary Verification Process Yes Hospitalized since last visit: No Completed: Implantable device outside of the clinic excluding No Patient Requires Transmission-Based No cellular tissue based products placed in the center Precautions: since last visit: Patient Has Alerts: Yes Has Dressing in Place as Prescribed: Yes Patient Alerts: Patient on Blood Has Compression in Place as Prescribed: Yes Thinner Pain Present Now: No aspirin 81 Electronic Signature(s) Signed: 12/23/2019 4:43:49 PM By: Montey Hora Entered By: Montey Hora on 12/23/2019 12:43:56 Risinger, [name] -------------------------------------------------------------------------------- Compression Therapy Details Patient Name: [name], [name]. Date of Service: 12/23/2019 12:45 PM Medical Record Number: [id number] Patient Account Number: [id number] Date of Birth/Sex: 1946-09-27 (74 y.o. F) Treating RN: Montey Hora Primary Care Bishop Vanderwerf: [name] Other Clinician: Referring Fey Coghill: [name] Treating Amaryah Mallen/Extender: Tito Dine in Treatment: 54 Compression Therapy Performed for Wound Assessment: Wound #5 Left,Medial Lower Leg Performed By: Clinician Montey Hora, RN Compression Type: Four Layer Pre Treatment ABI: 1 Post Procedure Diagnosis Same as Pre-procedure Electronic Signature(s) Signed: 12/23/2019 4:43:49 PM By: Montey Hora Entered By: Montey Hora on 12/23/2019 13:15:33 Threats, [name] -------------------------------------------------------------------------------- Compression Therapy Details Patient Name: [name], [name]. Date of Service: 12/23/2019 12:45 PM Medical Record Number: [id number] Patient Account Number: [id number] Date of Birth/Sex: [birth year] (74 y.o. F) Treating RN: Montey Hora Primary Care Maja Mccaffery: [name] Other Clinician: Referring Mathilde Mcwherter: [name] Treating Destenie Ingber/Extender: Tito Dine in Treatment: 56 Compression Therapy Performed for Wound Assessment: Wound #6 Left,Lateral Lower Leg Performed By: Clinician Montey Hora, RN Compression Type: Four Layer Pre Treatment ABI: 1 Post Procedure Diagnosis Same as Pre-procedure Electronic Signature(s) Signed: 12/23/2019 4:43:49 PM By: Montey Hora Entered By: Montey Hora on 12/23/2019 13:15:33 Dohn, [name] -------------------------------------------------------------------------------- Compression Therapy Details Patient Name: [name], [name]. Date of Service: 12/23/2019 12:45 PM Medical Record Number: [id number] Patient Account Number: [id number] Date of Birth/Sex: 02-May-1946 (74 y.o. F) Treating RN: Montey Hora Primary Care Earline Stiner: [name] Other Clinician: Referring Alanta Scobey: [name] Treating Gessica Jawad/Extender: Tito Dine in Treatment: 54 Compression Therapy Performed for Wound Assessment: Wound #9 Left,Lateral,Posterior Lower Leg Performed By: Clinician Montey Hora, RN Compression Type: Four  Layer Pre Treatment ABI: 1 Post Procedure Diagnosis Same as Pre-procedure Electronic Signature(s) Signed: 12/23/2019 4:43:49 PM By: Montey Hora Entered By: Montey Hora on 12/23/2019 13:15:33 Thier, [name] -------------------------------------------------------------------------------- Encounter Discharge Information Details Patient Name: [name], [name]. Date of Service: 12/23/2019 12:45 PM Medical Record Number: [id number] Patient Account Number: [id number] Date of Birth/Sex: 1946-05-28 (74 y.o. F) Treating RN: Montey Hora Primary Care Graceann Boileau: [name] Other Clinician: Referring Baldomero Mirarchi: [name] Treating Francena Zender/Extender: Tito Dine in Treatment: 55 Encounter Discharge Information Items Post Procedure Vitals Discharge Condition: Stable Temperature (F): 98.7 Ambulatory Status: Ambulatory Pulse (bpm): 77 Discharge Destination:  Home Respiratory Rate (breaths/min): 16 Transportation: Private Auto Blood Pressure (mmHg): 154/75 Accompanied By: self Schedule Follow-up Appointment: Yes Clinical Summary of Care: Electronic Signature(s) Signed: 12/23/2019 4:43:49 PM By: Montey Hora Entered By: Montey Hora on 12/23/2019 13:15:59 Gentles, [name] -------------------------------------------------------------------------------- Lower Extremity Assessment Details Patient Name: [name], [name]. Date of Service: 12/23/2019 12:45 PM Medical Record Number: [id number] Patient Account Number: [id number] Date of Birth/Sex: 06-Sep-1946 (74 y.o. F) Treating RN: Montey Hora Primary Care Renzo Vincelette: [name] Other Clinician: Referring Maritza Hosterman: [name] Treating Aileana Hodder/Extender: Tito Dine in Treatment: 54 Edema Assessment Assessed: [Left: No] [Right: No] Edema: [Left: Ye] [Right: s] Calf Left: Right: Point of Measurement: 33 cm From Medial Instep 51 cm cm Ankle Left: Right: Point of  Measurement: 10 cm From Medial Instep 24 cm cm Vascular Assessment Pulses: Dorsalis Pedis Palpable: [Left:Yes] Notes thigh L 63 and R 61 at 58cm Electronic Signature(s) Signed: 12/23/2019 4:43:49 PM By: Montey Hora Entered By: Montey Hora on 12/23/2019 12:51:28 Mcinroy, [name] -------------------------------------------------------------------------------- Multi Wound Chart Details Patient Name: [name], [name]. Date of Service: 12/23/2019 12:45 PM Medical Record Number: [id number] Patient Account Number: [id number] Date of Birth/Sex: 16-Aug-1946 (74 y.o. F) Treating RN: Montey Hora Primary Care Timmie Dugue: [name] Other Clinician: Referring Courtland Coppa: [name] Treating Dominyk Law/Extender: Tito Dine in Treatment: 37 Vital Signs Height(in): 63 Pulse(bpm): 70 Weight(lbs): 224.7 Blood Pressure(mmHg): 154/75 Body Mass Index(BMI): 40 Temperature(F): 98.7 Respiratory Rate 16 (breaths/min): Photos: Wound Location: Left Lower Leg - Medial Left Lower Leg - Lateral Left Lower Leg - Lateral, Posterior Wounding Event: Gradually Appeared Gradually Appeared Gradually Appeared Primary Etiology: Lymphedema Venous Leg Ulcer Venous Leg Ulcer Comorbid History: Cataracts, Asthma, Sleep Cataracts, Asthma, Sleep Cataracts, Asthma, Sleep Apnea, Deep Vein Apnea, Deep Vein Apnea, Deep Vein Thrombosis, Hypertension, Thrombosis, Hypertension, Thrombosis, Hypertension, Peripheral Venous Disease, Peripheral Venous Disease, Peripheral Venous Disease, Osteoarthritis, Received Osteoarthritis, Received Osteoarthritis, Received Chemotherapy, Received Chemotherapy, Received Chemotherapy, Received Radiation Radiation Radiation Date Acquired: 11/19/2018 01/19/2019 07/08/2019 Weeks of Treatment: 54 48 24 Wound Status: Open Open Open Measurements L x W x D 2.3x2.2x0.2 3.6x2.5x0.2 0.7x0.8x0.1 (cm) Area (cm) : 3.974 7.069 0.44 Volume (cm) : 0.795 1.414 0.044 %  Reduction in Area: 53.50% -3113.20% 22.10% % Reduction in Volume: 7.00% -6327.30% 22.80% Classification: Full Thickness Without Full Thickness Without Full Thickness Without Exposed Support Structures Exposed Support Structures Exposed Support Structures Exudate Amount: Large Large Large Exudate Type: Serosanguineous Serosanguineous Serosanguineous Exudate Color: red, brown red, brown red, brown Wound Margin: Flat and Intact Flat and Intact Flat and Intact Granulation Amount: Small (1-33%) Small (1-33%) Small (1-33%) Granulation Quality: Pale Pink Pink Necrotic Amount: Large (67-100%) Large (67-100%) Large (67-100%) Dragovich, Shreeya J. ([id number]) Exposed Structures: Fat Layer (Subcutaneous Fat Layer (Subcutaneous Fat Layer (Subcutaneous Tissue) Exposed: Yes Tissue) Exposed: Yes Tissue) Exposed: Yes Fascia: No Fascia: No Fascia: No Tendon: No Tendon: No Tendon: No Muscle: No Muscle: No Muscle: No Joint: No Joint: No Joint: No Bone: No Bone: No Bone: No Epithelialization: None None None Debridement: Debridement - Excisional Debridement - Excisional N/A Pre-procedure 13:11 13:13 N/A Verification/Time Out Taken: Pain Control: Lidocaine 4% Topical Solution Lidocaine 4% Topical Solution N/A Tissue Debrided: Subcutaneous, Slough Subcutaneous, Slough N/A Level: Skin/Subcutaneous Tissue Skin/Subcutaneous Tissue N/A Debridement Area (sq cm): 5.06 9 N/A Instrument: Curette Curette N/A Bleeding: Minimum Minimum N/A Hemostasis Achieved: Pressure Pressure N/A Procedural Pain: 0 0 N/A Post Procedural Pain: 0 0 N/A Debridement Treatment Procedure was tolerated well Procedure was tolerated well N/A Response: Post  Debridement 2.3x2.2x0.3 3.6x2.5x0.3 N/A Measurements L x W x D (cm) Post Debridement Volume: 1.192 2.121 N/A (cm) Procedures Performed: Compression Therapy Compression Therapy Compression Therapy Debridement Debridement Treatment Notes Wound #5 (Left, Medial Lower  Leg) Notes gentamicin cream, sorbact, drawtex, 4 layer wrap with unna to anchor Wound #6 (Left, Lateral Lower Leg) Notes gentamicin cream, sorbact, drawtex, 4 layer wrap with unna to anchor Wound #9 (Left, Lateral, Posterior Lower Leg) Notes gentamicin cream, sorbact, drawtex, 4 layer wrap with unna to anchor Electronic Signature(s) Signed: 12/23/2019 4:30:10 PM By: Linton Ham MD Entered By: Linton Ham on 12/23/2019 13:19:12 Guimaraes, [name] -------------------------------------------------------------------------------- Multi-Disciplinary Care Plan Details Patient Name: [name], [name]. Date of Service: 12/23/2019 12:45 PM Medical Record Number: [id number] Patient Account Number: [id number] Date of Birth/Sex: 1946-04-03 (74 y.o. F) Treating RN: Montey Hora Primary Care Raeford Brandenburg: [name] Other Clinician: Referring Dak Szumski: [name] Treating Zekiel Torian/Extender: Tito Dine in Treatment: 82 Active Inactive Medication Nursing Diagnoses: Knowledge deficit related to medication safety: actual or potential Goals: Patient/caregiver will demonstrate understanding of new oral/IV medications prescribed at the [facility] (topical prescriptions are covered under the skin breakdown problem) Date Initiated: 12/16/2019 Target Resolution Date: 01/13/2020 Goal Status: Active Interventions: Assess for medication contraindications each visit where new medications are prescribed Treatment Activities: New medication prescribed at [place] : 12/16/2019 Notes: Orientation to the Wound Care Program Nursing Diagnoses: Knowledge deficit related to the wound healing center program Goals: Patient/caregiver will verbalize understanding of the [place] Date Initiated: 12/10/2018 Target Resolution Date: 01/09/2019 Goal Status: Active Interventions: Provide education on orientation to the wound center Notes: Soft Tissue Infection Nursing  Diagnoses: Impaired tissue integrity Goals: Patient's soft tissue infection will resolve Date Initiated: 12/10/2018 Target Resolution Date: 01/09/2019 [name], [name] Goal Status: Active Interventions: Assess signs and symptoms of infection every visit Notes: Venous Leg Ulcer Nursing Diagnoses: Actual venous Insuffiency (use after diagnosis is confirmed) Goals: Patient will maintain optimal edema control Date Initiated: 12/10/2018 Target Resolution Date: 01/09/2019 Goal Status: Active Interventions: Assess peripheral edema status every visit. Treatment Activities: Therapeutic compression applied : 12/10/2018 Notes: Wound/Skin Impairment Nursing Diagnoses: Impaired tissue integrity Goals: Patient/caregiver will verbalize understanding of skin care regimen Date Initiated: 12/10/2018 Target Resolution Date: 01/09/2019 Goal Status: Active Interventions: Assess ulceration(s) every visit Treatment Activities: Topical wound management initiated : 12/10/2018 Notes: Electronic Signature(s) Signed: 12/23/2019 4:43:49 PM By: Montey Hora Entered By: Montey Hora on 12/23/2019 13:01:01 [name] -------------------------------------------------------------------------------- Pain Assessment Details Patient Name: [name], [name]. Date of Service: 12/23/2019 12:45 PM Medical Record Number: [id number] Patient Account Number: [id number] Date of Birth/Sex: [birth year] (74 y.o. F) Treating RN: Montey Hora Primary Care Kelani Robart: [name] Other Clinician: Referring Ismeal Heider: [name] Treating Brynleigh Sequeira/Extender: Tito Dine in Treatment: 54 Active Problems Location of Pain Severity and Description of Pain Patient Has Paino No Site Locations Pain Management and Medication Current Pain Management: Electronic Signature(s) Signed: 12/23/2019 4:43:49 PM By: Montey Hora Entered By: Montey Hora on 12/23/2019 12:44:03 Rock, [name] -------------------------------------------------------------------------------- Patient/Caregiver Education Details Patient Name: [name], [name]. Date of Service: 12/23/2019 12:45 PM Medical Record Number: [id number] Patient Account Number: [id number] Date of Birth/Gender: 20-Jul-1946 (74 y.o. F) Treating RN: Montey Hora Primary Care Physician: [name] Other Clinician: Referring Physician: [name] Treating Physician/Extender: Tito Dine in Treatment: 51 Education Assessment Education Provided To: Patient Education Topics Provided Venous: Handouts: Other: leg elevation Methods: Explain/Verbal Responses: State content correctly Electronic Signature(s) Signed: 12/23/2019  4:43:49 PM By: Montey Hora Entered By: Montey Hora on 12/23/2019 13:02:22 [name] -------------------------------------------------------------------------------- Wound Assessment Details Patient Name: [name], [name]. Date of Service: 12/23/2019 12:45 PM Medical Record Number: [id number] Patient Account Number: [id number] Date of Birth/Sex: 06/02/1946 (74 y.o. F) Treating RN: Montey Hora Primary Care Denim Kalmbach: [name] Other Clinician: Referring Alonda Weaber: [name] Treating Jamaya Sleeth/Extender: Tito Dine in Treatment: 57 Wound Status Wound Number: 5 Primary Lymphedema Etiology: Wound Location: Left Lower Leg - Medial Wound Open Wounding Event: Gradually Appeared Status: Date Acquired: 11/19/2018 Comorbid Cataracts, Asthma, Sleep Apnea, Deep Vein Weeks Of Treatment: 54 History: Thrombosis, Hypertension, Peripheral Venous Clustered Wound: No Disease, Osteoarthritis, Received Chemotherapy, Received Radiation Photos Wound Measurements Length: (cm) 2.3 Width: (cm) 2.2 Depth: (cm) 0.2 Area: (cm) 3.974 Volume: (cm) 0.795 % Reduction in Area: 53.5% % Reduction in Volume: 7% Epithelialization:  None Tunneling: No Undermining: No Wound Description Full Thickness Without Exposed Support Classification: Structures Wound Margin: Flat and Intact Exudate Large Amount: Exudate Type: Serosanguineous Exudate Color: red, brown Foul Odor After Cleansing: No Slough/Fibrino Yes Wound Bed Granulation Amount: Small (1-33%) Exposed Structure Granulation Quality: Pale Fascia Exposed: No Necrotic Amount: Large (67-100%) Fat Layer (Subcutaneous Tissue) Exposed: Yes Necrotic Quality: Adherent Slough Tendon Exposed: No Muscle Exposed: No Joint Exposed: No Bone Exposed: No Spofford, Corretta J. ([id number]) Treatment Notes Wound #5 (Left, Medial Lower Leg) Notes gentamicin cream, sorbact, drawtex, 4 layer wrap with unna to anchor Electronic Signature(s) Signed: 12/23/2019 4:43:49 PM By: Montey Hora Entered By: Montey Hora on 12/23/2019 12:56:50 Rosenau, [name] -------------------------------------------------------------------------------- Wound Assessment Details Patient Name: MELIHA, STEADY. Date of Service: 12/23/2019 12:45 PM Medical Record Number: [id number] Patient Account Number: [id number] Date of Birth/Sex: 1946/06/30 (74 y.o. F) Treating RN: Montey Hora Primary Care Savana Spina: [name] Other Clinician: Referring Amma Crear: [name] Treating Amrutha Avera/Extender: Tito Dine in Treatment: 67 Wound Status Wound Number: 6 Primary Venous Leg Ulcer Etiology: Wound Location: Left Lower Leg - Lateral Wound Open Wounding Event: Gradually Appeared Status: Date Acquired: 01/19/2019 Comorbid Cataracts, Asthma, Sleep Apnea, Deep Vein Weeks Of Treatment: 48 History: Thrombosis, Hypertension, Peripheral Venous Clustered Wound: No Disease, Osteoarthritis, Received Chemotherapy, Received Radiation Photos Wound Measurements Length: (cm) 3.6 Width: (cm) 2.5 Depth: (cm) 0.2 Area: (cm) 7.069 Volume: (cm) 1.414 % Reduction in Area:  -3113.2% % Reduction in Volume: -6327.3% Epithelialization: None Tunneling: No Undermining: No Wound Description Full Thickness Without Exposed Support Classification: Structures Wound Margin: Flat and Intact Exudate Large Amount: Exudate Type: Serosanguineous Exudate Color: red, brown Foul Odor After Cleansing: No Slough/Fibrino Yes Wound Bed Granulation Amount: Small (1-33%) Exposed Structure Granulation Quality: Pink Fascia Exposed: No Necrotic Amount: Large (67-100%) Fat Layer (Subcutaneous Tissue) Exposed: Yes Necrotic Quality: Adherent Slough Tendon Exposed: No Muscle Exposed: No Joint Exposed: No Bone Exposed: No Raczkowski, Wendy J. ([id number]) Treatment Notes Wound #6 (Left, Lateral Lower Leg) Notes gentamicin cream, sorbact, drawtex, 4 layer wrap with unna to anchor Electronic Signature(s) Signed: 12/23/2019 4:43:49 PM By: Montey Hora Entered By: Montey Hora on 12/23/2019 12:57:43 Vialpando, [name] -------------------------------------------------------------------------------- Wound Assessment Details Patient Name: AYRIN, PIZZIMENTI. Date of Service: 12/23/2019 12:45 PM Medical Record Number: [id number] Patient Account Number: [id number] Date of Birth/Sex: [birth year] (74 y.o. F) Treating RN: Montey Hora Primary Care Cayetano Mikita: [name] Other Clinician: Referring Shaquel Chavous: [name] Treating Haeven Nickle/Extender: Tito Dine in Treatment: 87 Wound Status Wound Number: 9 Primary Venous Leg Ulcer Etiology: Wound Location: Left Lower Leg - Lateral, Posterior Wound Open  Wounding Event: Gradually Appeared Status: Date Acquired: 07/08/2019 Comorbid Cataracts, Asthma, Sleep Apnea, Deep Vein Weeks Of Treatment: 24 History: Thrombosis, Hypertension, Peripheral Venous Clustered Wound: No Disease, Osteoarthritis, Received Chemotherapy, Received Radiation Photos Wound Measurements Length: (cm) 0.7 Width: (cm) 0.8 Depth:  (cm) 0.1 Area: (cm) 0.44 Volume: (cm) 0.044 % Reduction in Area: 22.1% % Reduction in Volume: 22.8% Epithelialization: None Tunneling: No Undermining: No Wound Description Full Thickness Without Exposed Support Classification: Structures Wound Margin: Flat and Intact Exudate Large Amount: Exudate Type: Serosanguineous Exudate Color: red, brown Foul Odor After Cleansing: No Slough/Fibrino Yes Wound Bed Granulation Amount: Small (1-33%) Exposed Structure Granulation Quality: Pink Fascia Exposed: No Necrotic Amount: Large (67-100%) Fat Layer (Subcutaneous Tissue) Exposed: Yes Necrotic Quality: Adherent Slough Tendon Exposed: No Muscle Exposed: No Joint Exposed: No Bone Exposed: No Elk, Takoda J. ([id number]) Treatment Notes Wound #9 (Left, Lateral, Posterior Lower Leg) Notes gentamicin cream, sorbact, drawtex, 4 layer wrap with unna to anchor Electronic Signature(s) Signed: 12/23/2019 4:43:49 PM By: Montey Hora Entered By: Montey Hora on 12/23/2019 12:58:18 Blahnik, [name] ([id number]) -------------------------------------------------------------------------------- Vitals Details Patient Name: BEEDIE, KRANZ. Date of Service: 12/23/2019 12:45 PM Medical Record Number: [id number] Patient Account Number: [id number] Date of Birth/Sex: 04/17/1946 (74 y.o. F) Treating RN: Montey Hora Primary Care Saron Tweed: [name] Other Clinician: Referring Suri Tafolla: [name] Treating Harish Bram/Extender: Tito Dine in Treatment: 54 Vital Signs Time Taken: 12:50 Temperature (F): 98.7 Height (in): 63 Pulse (bpm): 77 Weight (lbs): 224.7 Respiratory Rate (breaths/min): 16 Body Mass Index (BMI): 39.8 Blood Pressure (mmHg): 154/75 Reference Range: 80 - 120 mg / dl Electronic Signature(s) Signed: 12/23/2019 4:43:49 PM By: Montey Hora Entered By: Montey Hora on 12/23/2019 12:51:58

## 2019-12-25 NOTE — Progress Notes (Signed)
[place], KREKEL ([id number]) Visit Report for 12/02/2019 HPI Details Patient Name: [name], [name]. Date of Service: 12/02/2019 12:45 PM Medical Record Number: [id number] Patient Account Number: [id number] Date of Birth/Sex: 01/19/1946 (74 y.o. F) Treating RN: Cornell Barman Primary Care Provider: [name] Other Clinician: Referring Provider: [name] Treating Provider/Extender: Tito Dine in Treatment: 24 History of Present Illness HPI Description: Pleasant 74 year old with history of chronic venous insufficiency. No diabetes or peripheral vascular disease. Left ABI 1.29. Questionable history of left lower extremity DVT. She developed a recurrent ulceration on her left lateral calf in December 2015, which she attributes to poor diet and subsequent lower extremity edema. She underwent endovenous laser ablation of her left greater saphenous vein in 2010. She underwent laser ablation of accessory branch of left GSV in April 2016 by Dr. Kellie Simmering at [facility]. She was previously wearing Unna boots, which she tolerated well. Tolerating 2 layer compression and cadexomer iodine. She returns to clinic for follow-up and is without new complaints. She denies any significant pain at this time. She reports persistent pain with pressure. No claudication or ischemic rest pain. No fever or chills. No drainage. READMISSION 11/13/16; this is a 74 year old woman who is not a diabetic. She is here for a review of a painful area on her left medial lower extremity. I note that she was seen here previously last year for wound I believe to be in the same area. At that time she had undergone previously a left greater saphenous vein ablation by Dr. Kellie Simmering and she had a ablation of the anterior accessory branch of the left greater saphenous vein in March 2016. Seeing that the wound actually closed over. In reviewing the history with her today the ulcer in this area has been recurrent. She  describes a biopsy of this area in 2009 that only showed stasis physiology. 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 Do not see that she has ever had arterial studies in the left leg. She tells me she has a follow-up with Dr. Kellie Simmering in roughly 10 days In any case she developed the reopening of this area roughly a month ago. On the background of this she describes rapidly increasing edema which has responded to Lasix 40 mg and metolazone 2.5 mg as well as the patient's lymph massage. She has been told she has both venous insufficiency and lymphedema but she cannot tolerate compression stockings 11/28/16; the patient saw Dr. Kellie Simmering recently. Per the patient he did arterial Dopplers in the office that did not show evidence of arterial insufficiency, per the patient he stated "treat this like an ordinary venous ulcer". She also saw her dermatologist Dr. Ronnald Ramp who felt that this was more of a vascular ulcer. In general things are improving although she arrives today with increasing bilateral lower extremity edema with weeping a deeper fluid through the wound on the left medial leg compatible with some degree of lymphedema 12/04/16; the patient's wound is fully epithelialized but I don'[name] think fully healed. We will do another week of depression with Promogran and TCA however I suspect we'll be able to discharge her next week. This is a very unusual-looking wound which was initially a figure-of-eight type wound lying on its side surrounded by petechial like hemorrhage. She  has had venous ablation on this side. She apparently does not have an arterial issue per Dr. Kellie Simmering. She saw her dermatologist thought it was "vascular". Patient is definitely going to need ongoing compression and I  talked about this with her today she will go to elastic therapy after she leaves here next week 12/11/16; the patient's wound is not completely closed today. She has surrounding scar tissue and in further discussion with the patient it would appear that she had ulcers in this area in 2009 for a prolonged period of time ultimately requiring a punch biopsy of this area that only showed venous insufficiency. I did not previously pickup on this part of the history from the patient. 12/18/16; the patient's wound is completely epithelialized. There is no open area here. She has significant bilateral venous insufficiency with secondary lymphedema to a mild-to-moderate degree she does not have compression stockings.. She did not say anything to me when I was in the room, she told our intake nurse that she was still having pain in this area. This isn'[name] [name], [name] unusual recurrent small open area. She is going to go to elastic therapy to obtain compression stockings. 12/25/16; the patient's wound is fully epithelialized. There is no open area here. The patient describes some continued episodic discomfort in this area medial left calf. However everything looks fine and healed here. She is been to elastic therapy and caught herself 15-20 mmHg stockings, they apparently were having trouble getting 20-30 mm stockings in her size 01/22/17; this is a patient we discharged from the clinic a month ago. She has a recurrent open wound on her medial left calf. She had 15 mm support stockings. I told her I thought she needed 20-30 mm compression stockings. She tells me that she has been ill with hospitalization secondary to asthma and is been found to have severe hypokalemia likely secondary to a combination of Lasix and metolazone. This morning she noted blistering and leaking fluid on the posterior part of her left leg. She called our intake nurse urgently and we was saw her this afternoon. She has not  had any real discomfort here. I don'[name] know that she's been wearing any stockings on this leg for at least 2-3 days. ABIs in this clinic were 1.21 on the right and 1.3 on the left. She is previously seen vascular surgery who does not think that there is a peripheral arterial issue. 01/30/17; Patient arrives with no open wound on the left leg. She has been to elastic therapy and obtained 20-75mmhg below knee stockings and she has one on the right leg today. READMISSION 02/19/18; this [place]acs is a now 74 year old patient we've had in this clinic perhaps 3 times before. I had last looked at her from January 07 December 2016 with an area on the medial left leg. We discharged her on 12/25/16 however she had to be readmitted on 01/22/17 with a recurrence. I have in my notes that we discharged her on 20-30 mm stockings although she tells me she was only wearing support hose because she cannot get stockings on predominantly related to her cervical spine surgery/issues. She has had previous ablations done by vein and vascular in [place] including a great saphenous vein ablation on the left with an anterior accessory branch ablation I think both of these were in 2016. On one of the previous visit she had a biopsy noted 2009 that was negative. She is not felt to have an arterial issue. She is not  a diabetic. She does have a history of obstructive sleep apnea hypertension asthma as well as chronic venous insufficiency and lymphedema. On this occasion she noted 2 dry scaly patch on her left leg. She tried to put lotion on this it didn'[name] really help. There were 2 open areas.the patient has been seeing her primary physician from 02/05/18 through 02/14/18. She had Unna boots applied. The superior wound now on the lateral left leg has closed but she's had one wound that remains open on the lateral left leg. This is not the same spot as we dealt with in 2018. ABIs in this clinic were 1.3 bilaterally 02/26/18; patient has a  small wound on the left lateral calf. Dimensions are down. She has chronic venous insufficiency and lymphedema. 03/05/18; small open area on the left lateral calf. Dimensions are down. Tightly adherent necrotic debris over the surface of the wound which was difficult to remove. Also the dressing [over collagen] stuck to the wound surface. This was removed with some difficulty as well. Change the primary dressing to Hydrofera Blue ready 03/12/18; small open area on the left lateral calf. Comes in with tightly adherent surface eschar as well as some adherent Hydrofera Blue. 03/19/18; open area on the left lateral calf. Again adherent surface eschar as well as some adherent Hydrofera Blue nonviable subcutaneous tissue. She complained of pain all week even with the reduction from 4-3 layer compression I put on last week. Also she had an increase in her ankle and calf measurements probably related to the same thing. 03/26/18; open area on the left lateral calf. A very small open area remains here. We used silver alginate starting last week as the Hydrofera Blue seem to stick to the wound bed. In using 4-layer compression 04/02/18; the open area in the left lateral calf at some adherent slough which I removed there is no open area here. We are able to transition her into her own compression stocking. Truthfully I think this is probably his support hose. However this does not maintain skin integrity will be limited. She cannot put over the toe compression stockings on because of neck problems hand problems etc. She is allergic to the lining layer of juxta lites. We might be forced to use extremitease stocking should this fail READMIT 11/24/2018 Patient is now a 74 year old woman who is not a diabetic. She has been in this clinic on at least 3 previous occasions largely with recurrent wounds on her left leg secondary to chronic venous insufficiency with secondary lymphedema. Her situation is complicated by  inability to get stockings on and an allergy to neoprene which is apparently a component and at least juxta lites and other stockings. As a result she really has not been wearing any stockings on her legs. She tells [place] that roughly 2 or 3 weeks ago she started noticing a stinging sensation just above her ankle on the left medial aspect. She has been diagnosed with pseudogout and she wondered whether this was what she was experiencing. She tried to dress this with something she bought at the store however subsequently it pulled skin off and now she has an open wound that is not improving. She has been using Vaseline gauze with a cover bandage. She saw her primary doctor last week who [name], [name] put an Unna boot on her. ABIs in this clinic was 1.03 on the left 2/12; the area is on the left medial ankle. Odd-looking wound with what looks to be surface epithelialization  but a multitude of small petechial openings. This clearly not closed yet. We have been using silver alginate under 3 layer compression with TCA 2/19; the wound area did not look quite as good this week. Necrotic debris over the majority of the wound surface which required debridement. She continues to have a multitude of what looked to be small petechial openings. She reminds [place] that she had a biopsy on this initially during her first outbreak in 2015 in [place] [facility]. She expresses concern about this being a possible melanoma. 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 I assured her that this does not look anything like melanoma. Except for the petechial reaction it does look like a venous insufficiency area and she certainly has evidence of this on both sides 2/26; a difficult area on the left medial ankle. The patient clearly has chronic venous hypertension with some degree of lymphedema. The odd thing about the area is  the small petechial hemorrhages. I am not really sure how to explain this. This was present last time and this is not a compression injury. We have been using Hydrofera Blue which I changed to last week 3/4; still using Hydrofera Blue. Aggressive debridement today. She does not have known arterial issues. She has seen Dr. Kellie Simmering at [facility] vein and vascular and and has an ablation on the left. [Anterior accessory branch of the greater saphenous]. From what I remember they did not feel she had an arterial issue. The patient has had this area biopsied in 2009 at [facility] dermatology and by her recollection they said this was "stasis". She is also follow-up with dermatology locally who thought that this was more of a vascular issue 3/11; using Hydrofera Blue. Aggressive debridement today. She does not have an arterial issue. We are using 3 layer compression although we may need to go to 4. The patient has been in for multiple changes to her wrap since I last saw her a week ago. She says that the area was leaking. I do not have too much more information on what was found 01/19/19 on evaluation today patient was actually being seen for a nurse visit when unfortunately she had the area on her left lateral lower extremity as well as weeping from the right lower extremity that became apparent. Therefore we did end up actually seeing her for a full visit with myself. She is having some pain at this site as well but fortunately nothing too significant at this point. No fevers, chills, nausea, or vomiting noted at this time. 3/18-Patient is back to the clinic with the left leg venous leg ulcer, the ulcer is larger in size, has a surface that is densely adherent with fibrinous tissue, the Hydrofera Blue was used but is densely adherent and there was difficulty in removing it. The right lower extremity was also wrapped for weeping edema. Patient has a new area over the left lateral foot above the malleolus  that is small and appears to have no debris with intact surrounding skin. Patient is on increased dose of Lasix also as a means to edema management 3/25; the patient has a nonhealing venous ulcer on the medial left leg and last week developed a smaller area on the lateral left calf. We have been using Hydrofera Blue with a contact layer. 4/1; no major change in these wounds areas. Left medial and more recently left lateral calf. I tried Iodoflex last week  to aid in debridement she did not tolerate this. She stated her pain was terrible all week. She took the top layer of the 4 layer compression off. 4/8; the patient actually looks somewhat better in terms of her more prominent left lateral calf wound. There is some healthy looking tissue here. She is still complaining of a lot of discomfort. 4/15; patient in a lot of pain secondary to sciatica. She is on a prednisone taper prescribed by her primary physician. She has the 2 areas one on the left medial and more recently a smaller area on the left lateral calf. Both of these just above the malleoli 4/22; her back pain is better but she still states she is very uncomfortable and now feels she is intolerant to the [employer]. No real change in the wounds we have been using Sorbact. She has been previously intolerant to Iodoflex. There is not a lot of option about what we can use to debride this wound under compression that she no doubt needs. sHe states Ultram no longer works for her pain 4/29; no major change in the wounds slightly increased depth. Surface on the original medial wound perhaps somewhat improved however the more recent area on the lateral left ankle is 100% covered in very adherent debris we have been using Sorbact. She tolerates 4 layer compression well and her edema control is a lot better. She has not had to come in for a nurse check 5/6; no major change in the condition of the wounds. She did consent to debridement today which was  done with some difficulty. Continuing Sorbact. She did not tolerate Iodoflex. She was in for a check of her compression the day after we wrapped her last week this was adjusted but nothing much was found 5/13; no major change in the condition or area of the wounds. I was able to get a fairly aggressive debridement done on the lateral left leg wound. Even using Sorbact under compression. She came back in on Friday to have the wrap changed. She says she felt uncomfortable on the lateral aspect of her ankle. She has a long history of chronic venous insufficiency including previous ablation surgery on this side. 5/20-Patient returns for wounds on left leg with both wounds covered in slough, with the lateral leg wound larger in size, she has been in 3 layer compression and felt more comfortable, she describes pain in ankle, in leg and pins and needles in foot, Maura, Marla J. ([id number]) and is about to try Pamelor for this 6/3; wounds on the left lateral and left medial leg. The area medially which is the most recent of the 2 seems to have had the largest increase in dimensions. We have been using Sorbac to try and debride the surface. She has been to see orthopedics they apparently did a plain x-ray that was indeterminant. Diagnosed her with neuropathy and they have ordered an MRI to determine if there is underlying osteomyelitis. This was not high on my thought list but I suppose it is prudent. We have advised her to make an appointment with vein and vascular in [name]. She has a history of a left greater saphenous and accessory vein ablations I wonder if there is anything else that can be done from a surgical point of view to help in these difficult refractory wounds. We have previously healed this wound on one occasion but it keeps on reopening [medial side] 6/10; deep tissue culture I did last week I think on the  left medial wound showed both moderate E. coli and moderate staph aureus  [MSSA]. She is going to require antibiotics and I have chosen Augmentin. We have been using Sorbact and we have made better looking wound surface on both sides but certainly no improvement in wound area. She was back in last Friday apparently for a dressing changes the wrap was hurting her outer left ankle. She has not managed to get a hold of vein and vascular in [place]. We are going to have to make her that appointment 6/17; patient is tolerating the Augmentin. She had an MRI that I think was ordered by orthopedic surgeon this did not show osteomyelitis or an abscess did suggest cellulitis. We have been using Sorbact to the lateral and medial ankles. We have been trying to arrange a follow-up appointment with vein and vascular in [place] or did her original ablations. We apparently an area sent the request to vein and vascular in [facility] 6/24; patient has completed the Augmentin. We do not yet have a vein and vascular appointment in [place]. I am not sure what the issue is here we have asked her to call tomorrow. We are using Sorbact. Making some improvements and especially the medial wound. Both surfaces however look better medial and lateral. 7/1; the patient has been in contact with vein and vascular in [place] but has not yet received an appointment. Using Sorbact we have gradually improve the wound surface with no improvement in surface area. She is approved for Apligraf but the wound surface still is not completely viable. She has not had to come in for a dressing change 7/8; the patient has an appointment with vein and vascular on 7/31 which is a Friday afternoon. She is concerned about getting back here for [place] to dress her wounds. I think it is important to have them goal for her venous reflux/history of ablations etc. to see if anything else can be done. She apparently tested positive for 1 of the blood tests with regards to lupus and saw a rheumatologist. He has raised  the issue of vasculitis again. I have had this thought in the past however the evidence seems overwhelming that this is a venous reflux etiology. If the rheumatologist tells me there is clinical and laboratory investigation is positive for lupus I will rethink this. 7/15; the patient's wound surfaces are quite a bit better. The medial area which was her original wound now has no depth although the lateral wound which was the more recent area actually appears larger. Both with viable surfaces which is indeed better. Using Sorbact. I wanted to use Apligraf on her however there is the issue of the vein and vascular appointment on 7/31 at 2:00 in the afternoon which would not allow her to get back to be rewrapped and they would no doubt remove the graft 7/22; the patient's wound surfaces have moderate amount of debris although generally look better. The lateral one is larger with 2 small satellite areas superiorly. We are waiting for her vein and vascular appointment on 7/31. She has been approved for Apligraf which I would like to use after th 7/29; wound surfaces have improved no debridement is required we have been using Sorbact. She sees vein and vascular on Friday with this so question of whether anything can be done to lessen the likelihood of recurrence and/or speed the healing of these areas. She is already had previous ablations. She no doubt has severe venous hypertension 8/5-Patient returns at 1 week, she  was in [employer] for 3 days by her podiatrist, we have been using so backed to the wound, she has increased pain in both the wounds on the left lower leg especially the more distal one on the lateral aspect 8/12-Patient returns at 1 week and she is agreeable to having debridement in both wounds on her left leg today. We have been using Sorbact, and vascular studies were reviewed at last visit 8/19; the patient arrives with her wounds fairly clean and no debridement is required. We have used  Sorbact which is really done a nice job in cleaning up these very difficult wound surfaces. The patient saw Dr. Donzetta Matters of vascular surgery on 7/31. He did not feel that there was an arterial component. He felt that her treated greater saphenous vein is adequately addressed and that the small saphenous vein did not appear to be involved significantly. She was also noted to have deep venous reflux which is not treatable. Dr. Donzetta Matters mentioned the possibility of a central obstructive component leading to reflux and he offered her central venography. She wanted to discuss this or think about it. I have urged her to go ahead with this. She has had recurrent difficult wounds in these areas which do heal but after months in the clinic. If there is anything that can be done to reduce the likelihood of this I think it is worth it. 9/2 she is still working towards getting follow-up with Dr. Donzetta Matters to schedule her CT. Things are quite a bit worse venography. I put Apligraf on 2 weeks ago on both wounds on the medial and lateral part of her left lower leg. She arrives in clinic today with 3 superficial additional wounds above the area laterally and one below the wound medially. She describes a lot of discomfort. I think these are probably wrapped injuries. Does not look like she has cellulitis. 07/20/2019 on evaluation today patient appears to be doing somewhat poorly in regard to her lower extremity ulcers. She in fact showed signs of erythema in fact we may even be dealing with an infection at this time. Unfortunately I am unsure if this SHANARIA, FASOLINO ([id number]) is just infection or if indeed there may be some allergic reaction that occurred as a result of the Apligraf application. With that being said that would be unusual but nonetheless not impossible in this patient is one who is unfortunately allergic to quite a bit. Currently we have been using the Sorbact which seems to do as well as anything for her. I do  think we may want to obtain a culture today to see if there is anything showing up there that may need to be addressed. 9/16; noted that last week the wounds look worse in 1 week follow-up of the Apligraf. Using Sorbact as of 2 days ago. She arrives with copious amounts of drainage and new skin breakdown on the back of the left calf. The wounds arm more substantial bilaterally. There is a fair amount of swelling in the left calf no overt DVT there is edema present I think in the left greater than right thigh. She is supposed to go on 9/28 for CT venography. The wounds on the medial and lateral calf are worse and she has new skin breakdown posteriorly at least new for me. This is almost developing into a circumferential wound area The Apligraf was taken off last week which I agree with things are not going in the right direction a culture was done we  do not have that back yet. She is on Augmentin that she started 2 days ago 9/23; dressing was changed by her nurses on Monday. In general there is no improvement in the wound areas although the area looks less angry than last week. She did get Augmentin for MSSA cultured on the 14th. She still appears to have too much swelling in the left leg even with 3 layer compression 9/30; the patient underwent her procedure on 9/28 by Dr. Donzetta [facility]. She was discovered to have the common iliac vein measuring 12.2 mm but at the level of L4-L5 measured 3 mm. After stenting it measured 10 mm. 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 Rouleaux flow in the common femoral and femoral vein was observed much improved after stenting. We are using silver alginate to the wounds on the medial and lateral ankle on the left. 4 layer compression 10/7; the patient had fluid swelling around her knee and 4 layer compression. At the advice of vein and vascular this was reduced to 3 layer which she is tolerating better. We have been using  silver alginate under 3 layer compression since last Friday 10/14; arrives with the areas on the left ankle looking a lot better. Inflammation in the area also a lot better. She came in for a nurse check on 10/9 10/21; continued nice improvement. Slight improvements in surface area of both the medial and lateral wounds on the left. A lot of the satellite lesions in the weeping erythema around these from stasis dermatitis is resolved. We have been using silver alginate 10/28; general improvement in the entire wound areas although not a lot of change in dimensions the wound certainly looks better. There is a lot less in terms of venous inflammation. Continue silver alginate this week however look towards Hydrofera Blue next week 11/4; very adherent debris on the medial wound left wound is not as bad. We have been using silver alginate. Change to [facility] today 11/11; very adherent debris on both wound areas. She went to vein and vascular last week and follow-up they put in [place] boot on this today. He says the [facility] was adherent. Wound is definitely not as good as last week. Especially on the left there the satellite lesions look more prominent 11/18; absolutely no better. erythema on lateral aspect with tenderness. 09/30/2019 on evaluation today patient appears to actually be doing better. Dr. Dellia Nims did put her on doxycycline last week which I do believe has helped her at this point. Fortunately there is no signs of active infection at this time. No fevers, chills, nausea, vomiting, or diarrhea. I do believe he may want extend the doxycycline for 7 additional days just to ensure everything does completely cleared up the patient is in agreement with that plan. Otherwise she is going require some sharp debridement today 12/2; patient is completing a 2-week course of doxycycline. I gave her this empirically for inflammation as well as infection when I last saw her 2 weeks ago. All of  this seems to be better. She is using silver alginate she has the area on the medial aspect of the larger area laterally and the 2 small satellite regions laterally above the major wound. 12/9; the patient's wound on the left medial and left lateral calf look really quite good. We have been using silver alginate. She saw vein and vascular in follow-up on 10/09/2019. She has had a previous left greater saphenous vein ablation by  Dr. Oscar La in 2016. More recently she underwent a left common iliac vein stent by Dr. 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 The swelling is improved and certainly the wounds have improved. The patient shows [place] today area on the right medial calf there is almost no wound but leaking lymphedema. She says she start this started 3 or 4 days ago. She did not traumatize it. It is not painful. She does not wear compression on that side 12/16; the patient continues to do well laterally. Medially still requiring debridement. The area on the right calf did not materialize to anything and is not currently open. We wrapped this last time. She has support stockings for that leg although I am not sure they are going to provide adequate compression 12/23; the lateral wound looks stable. Medially still requiring debridement for tightly adherent fibrinous debris. We've been LAVILA, MOSKAL ([id number]) using silver alginate. Surface area not any different 12/30; neither wound is any better with regards to surface and the area on the left lateral is larger. I been using silver alginate to the left lateral which look quite good last week and Sorbact to the left medial 11/11/2019. Lateral wound area actually looks better and somewhat smaller. Medial still requires a very aggressive debridement today. We have been using Sorbact on both wound areas 1/13; not much better still adherent debris bilaterally. I been using Sorbact. She has severe venous hypertension. Probably some degree of  dermal fibrosis distally. I wonder whether tighter compression might help and I am going to try that today. We also need to work on the bioburden 1/20; using Sorbact. She has severe venous hypertension status post stent placement for pelvic vein compression. We applied gentamicin last time to see if we could reduce bioburden I had some discussion with her today about the use of pentoxifylline. This is occasionally used in this setting for wounds with refractory venous insufficiency. However this interacts with Plavix. She tells me that she was put on this after stent placement for 3 months. She will call Dr. Claretha Cooper office to discuss 1/27; we are using gentamicin under Sorbact. 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 She has a stent. Wound medially is measuring smaller this week. Laterally measuring slightly larger although she has some satellite lesions superiorly Electronic Signature(s) Signed: 12/02/2019 4:18:13 PM By: Linton Ham MD Entered By: Linton Ham on 12/02/2019 13:32:44 Betsill, [name] -------------------------------------------------------------------------------- Physical Exam Details Patient Name: KIHANNA, BRUCKNER. Date of Service: 12/02/2019 12:45 PM Medical Record Number: [id number] Patient Account Number: [id number] Date of Birth/Sex: 12-Apr-1946 (74 y.o. F) Treating RN: Cornell Barman Primary Care Provider: [name] Other Clinician: Referring Provider: [name] Treating Provider/Extender: Tito Dine in Treatment: 54 Constitutional Sitting or standing Blood Pressure is within target range for patient.. Pulse regular and within target range for patient.Marland Kitchen Respirations regular, non-labored and within target range.. Temperature is normal and within the target range for the patient.Marland Kitchen appears in no distress. Respiratory Respiratory effort is easy and symmetric bilaterally. Rate is normal at rest and on  room air.. Cardiovascular Pedal pulses are palpable. Excellent edema control. Notes Wound exam oBoth areas in the lateral and medial ankle on the left. Medial wound appears smaller. Very healthy looking surface which is nice to see. Laterally the surface looks healthy although there is no change in measurements. Satellite lesion superiorly. We have excellent edema control Electronic Signature(s) Signed: 12/02/2019 4:18:13 PM By: Linton Ham MD Entered By: Dellia Nims,  Donovan Persley on 12/02/2019 13:34:12 [name], [name] -------------------------------------------------------------------------------- Physician Orders Details Patient Name: JAYMEE, CELA. Date of Service: 12/02/2019 12:45 PM Medical Record Number: [id number] Patient Account Number: [id number] Date of Birth/Sex: 02/28/1946 (74 y.o. F) Treating RN: Cornell Barman Primary Care Provider: [name] Other Clinician: Referring Provider: [name] Treating Provider/Extender: Tito Dine in Treatment: 50 Verbal / Phone Orders: No Diagnosis Coding Wound Cleansing Wound #5 Left,Medial Lower Leg o Cleanse wound with mild soap and water Wound #6 Left,Lateral Lower Leg o Cleanse wound with mild soap and water Wound #9 Left,Lateral,Posterior Lower Leg o Cleanse wound with mild soap and water Anesthetic (add to Medication List) Wound #5 Left,Medial Lower Leg o Topical Lidocaine 4% cream applied to wound bed prior to debridement (In Clinic Only). o Benzocaine Topical Anesthetic Spray applied to wound bed prior to debridement (In Clinic Only). Wound #6 Left,Lateral Lower Leg o Topical Lidocaine 4% cream applied to wound bed prior to debridement (In Clinic Only). o Benzocaine Topical Anesthetic Spray applied to wound bed prior to debridement (In Clinic Only). Wound #9 Left,Lateral,Posterior Lower Leg o Topical Lidocaine 4% cream applied to wound bed prior to debridement (In Clinic  Only). o Benzocaine Topical Anesthetic Spray applied to wound bed prior to debridement (In Clinic Only). Skin Barriers/Peri-Wound Care Wound #5 Left,Medial Lower Leg o Other: - Gentamycin Wound #6 Left,Lateral Lower Leg o Other: - Gentamycin Wound #9 Left,Lateral,Posterior Lower Leg o Other: - Gentamycin Primary Wound Dressing Wound #5 Left,Medial Lower Leg o Other: - sorbact Wound #6 Left,Lateral Lower Leg o Other: - sorbact Wound #9 Left,Lateral,Posterior Lower Leg o Other: - sorbact Secondary Dressing [name], [name]. ([id number]) Wound #5 Left,Medial Lower Leg o Drawtex Wound #6 Left,Lateral Lower Leg o Drawtex Wound #9 Left,Lateral,Posterior Lower Leg o Drawtex Dressing Change Frequency Wound #5 Left,Medial Lower Leg o Change dressing every week o Other: - as needed. Wound #6 Left,Lateral Lower Leg o Change dressing every week o Other: - as needed. Wound #9 Left,Lateral,Posterior Lower Leg o Change dressing every week o Other: - as needed. Follow-up Appointments Wound #5 Left,Medial Lower Leg o Return Appointment in 1 week. o Nurse Visit as needed - Call of needed Wound #6 Left,Lateral Lower Leg o Return Appointment in 1 week. o Nurse Visit as needed - Call of needed Wound #9 Left,Lateral,Posterior Lower Leg o Return Appointment in 1 week. o Nurse Visit as needed - Call of needed Edema Control Wound #5 Left,Medial Lower Leg o 4-Layer Compression System - Left Lower Extremity. Wound #6 Left,Lateral Lower Leg o 4-Layer Compression System - Left Lower Extremity. Wound #9 Left,Lateral,Posterior Lower Leg o 4-Layer Compression System - Left Lower Extremity. Electronic Signature(s) Signed: 12/02/2019 4:18:13 PM By: Linton Ham MD Signed: 12/25/2019 11:50:22 AM By: [name], [name], RN, CWS, [name], [name] Entered By: [name], [name], RN, CWS, Kim on 12/02/2019 13:16:46 [name], [name]  ([id number]) -------------------------------------------------------------------------------- Problem List Details Patient Name: [name], [name]. Date of Service: 12/02/2019 12:45 PM Medical Record Number: [id number] Patient Account Number: [id number] Date of Birth/Sex: [birth year] (75 y.o. F) Treating RN: Cornell Barman Primary Care Provider: [name] Other Clinician: Referring Provider: [name] Treating Provider/Extender: Tito Dine in Treatment: 48 Active Problems ICD-10 Evaluated Encounter Code Description Active Date Today Diagnosis L97.221 Non-pressure chronic ulcer of left calf limited to breakdown of 01/07/2019 No Yes skin I87.321 Chronic venous hypertension (idiopathic) with inflammation of 12/10/2018 No Yes right lower extremity I89.0 Lymphedema, not elsewhere classified 12/10/2018 No Yes L97.818 Non-pressure  chronic ulcer of other part of right lower leg 10/14/2019 No Yes with other specified severity Inactive Problems Resolved Problems Electronic Signature(s) Signed: 12/02/2019 4:18:13 PM By: Linton Ham MD Entered By: Linton Ham on 12/02/2019 13:31:24 Hellstrom, [name] ([id number]) -------------------------------------------------------------------------------- Progress Note Details Patient Name: [name], [name]. Date of Service: 12/02/2019 12:45 PM Medical Record Number: [id number] Patient Account Number: [id number] Date of Birth/Sex: 04-09-1946 (74 y.o. F) Treating RN: Cornell Barman Primary Care Provider: [name] Other Clinician: Referring Provider: [name] Treating Provider/Extender: Tito Dine in Treatment: 51 Subjective History of Present Illness (HPI) Pleasant 74 year old with history of chronic venous insufficiency. No diabetes or peripheral vascular disease. Left ABI 1.29. Questionable history of left lower extremity DVT. She developed a recurrent ulceration on her left lateral calf in December 2015,  which she attributes to poor diet and subsequent lower extremity edema. She underwent endovenous laser ablation of her left greater saphenous vein in 2010. She underwent laser ablation of accessory branch of left GSV in April 2016 by Dr. Kellie Simmering at [facility]. She was previously wearing Unna boots, which she tolerated well. Tolerating 2 layer compression and cadexomer iodine. She returns to clinic for follow-up and is without new complaints. She denies any significant pain at this time. She reports persistent pain with pressure. No claudication or ischemic rest pain. No fever or chills. No drainage. READMISSION 11/13/16; this is a 74 year old woman who is not a diabetic. She is here for a review of a painful area on her left medial lower extremity. I note that she was seen here previously last year for wound I believe to be in the same area. At that time she had undergone previously a left greater saphenous vein ablation by Dr. Kellie Simmering and she had a ablation of the anterior accessory branch of the left greater saphenous vein in March 2016. Seeing that the wound actually closed over. In reviewing the history with her today the ulcer in this area has been recurrent. She describes a biopsy of this area in 2009 that only showed stasis physiology. 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 Do not see that she has ever had arterial studies in the left leg. She tells me she has a follow-up with Dr. Kellie Simmering in roughly 10 days In any case she developed the reopening of this area roughly a month ago. On the background of this she describes rapidly increasing edema which has responded to Lasix 40 mg and metolazone 2.5 mg as well as the patient's lymph massage. She has been told she has both  venous insufficiency and lymphedema but she cannot tolerate compression stockings 11/28/16; the patient saw Dr. Kellie Simmering recently. Per the patient he did arterial Dopplers in the office that did not show evidence of arterial insufficiency, per the patient he stated "treat this like an ordinary venous ulcer". She also saw her dermatologist Dr. Ronnald Ramp who felt that this was more of a vascular ulcer. In general things are improving although she arrives today with increasing bilateral lower extremity edema with weeping a deeper fluid through the wound on the left medial leg compatible with some degree of lymphedema 12/04/16; the patient's wound is fully epithelialized but I don'[name] think fully healed. We will do another week of depression with Promogran  and TCA however I suspect we'll be able to discharge her next week. This is a very unusual-looking wound which was initially a figure-of-eight type wound lying on its side surrounded by petechial like hemorrhage. She has had venous ablation on this side. She apparently does not have an arterial issue per Dr. Kellie Simmering. She saw her dermatologist thought it was "vascular". Patient is definitely going to need ongoing compression and I talked about this with her today she will go to elastic therapy after she leaves here next week 12/11/16; the patient's wound is not completely closed today. She has surrounding scar tissue and in further discussion with the patient it would appear that she had ulcers in this area in 2009 for a prolonged period of time ultimately requiring a punch biopsy of this area that only showed venous insufficiency. I did not previously pickup on this part of the history from the patient. 12/18/16; the patient's wound is completely epithelialized. There is no open area here. She has significant bilateral venous insufficiency with secondary lymphedema to a mild-to-moderate degree she does not have compression stockings.. She did not say anything to  me when I was in the room, she told our intake nurse that she was still having pain in this area. This isn'[name] unusual recurrent small open area. She is going to go to elastic therapy to obtain compression stockings. 12/25/16; the patient's wound is fully epithelialized. There is no open area here. The patient describes some continued episodic discomfort in this area medial left calf. However everything looks fine and healed here. She is been to elastic therapy and YAHDIRA, [name] caught herself 15-20 mmHg stockings, they apparently were having trouble getting 20-30 mm stockings in her size 01/22/17; this is a patient we discharged from the clinic a month ago. She has a recurrent open wound on her medial left calf. She had 15 mm support stockings. I told her I thought she needed 20-30 mm compression stockings. She tells me that she has been ill with hospitalization secondary to asthma and is been found to have severe hypokalemia likely secondary to a combination of Lasix and metolazone. This morning she noted blistering and leaking fluid on the posterior part of her left leg. She called our intake nurse urgently and we was saw her this afternoon. She has not had any real discomfort here. I don'[name] know that she's been wearing any stockings on this leg for at least 2-3 days. ABIs in this clinic were 1.21 on the right and 1.3 on the left. She is previously seen vascular surgery who does not think that there is a peripheral arterial issue. 01/30/17; Patient arrives with no open wound on the left leg. She has been to elastic therapy and obtained 20-3mmhg below knee stockings and she has one on the right leg today. READMISSION 02/19/18; this Nelms is a now 74 year old patient we've had in this clinic perhaps 3 times before. I had last looked at her from January 07 December 2016 with an area on the medial left leg. We discharged her on 12/25/16 however she had to be readmitted on 01/22/17 with a  recurrence. I have in my notes that we discharged her on 20-30 mm stockings although she tells me she was only wearing support hose because she cannot get stockings on predominantly related to her cervical spine surgery/issues. She has had previous ablations done by vein and vascular in [place] including a great saphenous vein ablation on the left with an anterior accessory branch  ablation I think both of these were in 2016. On one of the previous visit she had a biopsy noted 2009 that was negative. She is not felt to have an arterial issue. She is not a diabetic. She does have a history of obstructive sleep apnea hypertension asthma as well as chronic venous insufficiency and lymphedema. On this occasion she noted 2 dry scaly patch on her left leg. She tried to put lotion on this it didn'[name] really help. There were 2 open areas.the patient has been seeing her primary physician from 02/05/18 through 02/14/18. She had Unna boots applied. The superior wound now on the lateral left leg has closed but she's had one wound that remains open on the lateral left leg. This is not the same spot as we dealt with in 2018. ABIs in this clinic were 1.3 bilaterally 02/26/18; patient has a small wound on the left lateral calf. Dimensions are down. She has chronic venous insufficiency and lymphedema. 03/05/18; small open area on the left lateral calf. Dimensions are down. Tightly adherent necrotic debris over the surface of the wound which was difficult to remove. Also the dressing [over collagen] stuck to the wound surface. This was removed with some difficulty as well. Change the primary dressing to Hydrofera Blue ready 03/12/18; small open area on the left lateral calf. Comes in with tightly adherent surface eschar as well as some adherent Hydrofera Blue. 03/19/18; open area on the left lateral calf. Again adherent surface eschar as well as some adherent Hydrofera Blue nonviable subcutaneous tissue. She complained of  pain all week even with the reduction from 4-3 layer compression I put on last week. Also she had an increase in her ankle and calf measurements probably related to the same thing. 03/26/18; open area on the left lateral calf. A very small open area remains here. We used silver alginate starting last week as the Hydrofera Blue seem to stick to the wound bed. In using 4-layer compression 04/02/18; the open area in the left lateral calf at some adherent slough which I removed there is no open area here. We are able to transition her into her own compression stocking. Truthfully I think this is probably his support hose. However this does not maintain skin integrity will be limited. She cannot put over the toe compression stockings on because of neck problems hand problems etc. She is allergic to the lining layer of juxta lites. We might be forced to use extremitease stocking should this fail READMIT 11/24/2018 Patient is now a 74 year old woman who is not a diabetic. She has been in this clinic on at least 3 previous occasions largely with recurrent wounds on her left leg secondary to chronic venous insufficiency with secondary lymphedema. Her situation is complicated by inability to get stockings on and an allergy to neoprene which is apparently a component and at least juxta lites and other stockings. As a result she really has not been wearing any stockings on her legs. She tells [place] that roughly 2 or 3 weeks ago she started noticing a stinging sensation just above her ankle on the left medial aspect. She has been diagnosed with pseudogout and she wondered whether this was what she was experiencing. She tried to dress this with something she bought at the store however subsequently it pulled skin off and now she has an open wound that is not improving. She has been using Vaseline gauze with a cover bandage. She saw her primary doctor last week who put an  Unna boot on her. ABIs in this clinic was  1.03 on the left [name], [name]. ([id number]) 2/12; the area is on the left medial ankle. Odd-looking wound with what looks to be surface epithelialization but a multitude of small petechial openings. This clearly not closed yet. We have been using silver alginate under 3 layer compression with TCA 2/19; the wound area did not look quite as good this week. Necrotic debris over the majority of the wound surface which required debridement. She continues to have a multitude of what looked to be small petechial openings. She reminds [place] that she had a biopsy on this initially during her first outbreak in 2015 in [place] [facility]. She expresses concern about this being a possible melanoma. 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 I assured her that this does not look anything like melanoma. Except for the petechial reaction it does look like a venous insufficiency area and she certainly has evidence of this on both sides 2/26; a difficult area on the left medial ankle. The patient clearly has chronic venous hypertension with some degree of lymphedema. The odd thing about the area is the small petechial hemorrhages. I am not really sure how to explain this. This was present last time and this is not a compression injury. We have been using Hydrofera Blue which I changed to last week 3/4; still using Hydrofera Blue. Aggressive debridement today. She does not have known arterial issues. She has seen Dr. Kellie Simmering at [facility] vein and vascular and and has an ablation on the left. [Anterior accessory branch of the greater saphenous]. From what I remember they did not feel she had an arterial issue. The patient has had this area biopsied in 2009 at [facility] dermatology and by her recollection they said this was "stasis". She is also follow-up with dermatology locally who thought that this was more of a vascular  issue 3/11; using Hydrofera Blue. Aggressive debridement today. She does not have an arterial issue. We are using 3 layer compression although we may need to go to 4. The patient has been in for multiple changes to her wrap since I last saw her a week ago. She says that the area was leaking. I do not have too much more information on what was found 01/19/19 on evaluation today patient was actually being seen for a nurse visit when unfortunately she had the area on her left lateral lower extremity as well as weeping from the right lower extremity that became apparent. Therefore we did end up actually seeing her for a full visit with myself. She is having some pain at this site as well but fortunately nothing too significant at this point. No fevers, chills, nausea, or vomiting noted at this time. 3/18-Patient is back to the clinic with the left leg venous leg ulcer, the ulcer is larger in size, has a surface that is densely adherent with fibrinous tissue, the Hydrofera Blue was used but is densely adherent and there was difficulty in removing it. The right lower extremity was also wrapped for weeping edema. Patient has a new area over the left lateral foot above the malleolus that is small and appears to have no debris with intact surrounding skin. Patient is on increased dose of Lasix also as a means to edema management 3/25; the patient has a nonhealing venous ulcer on the medial left leg and last week developed a smaller area on  the lateral left calf. We have been using Hydrofera Blue with a contact layer. 4/1; no major change in these wounds areas. Left medial and more recently left lateral calf. I tried Iodoflex last week to aid in debridement she did not tolerate this. She stated her pain was terrible all week. She took the top layer of the 4 layer compression off. 4/8; the patient actually looks somewhat better in terms of her more prominent left lateral calf wound. There is some  healthy looking tissue here. She is still complaining of a lot of discomfort. 4/15; patient in a lot of pain secondary to sciatica. She is on a prednisone taper prescribed by her primary physician. She has the 2 areas one on the left medial and more recently a smaller area on the left lateral calf. Both of these just above the malleoli 4/22; her back pain is better but she still states she is very uncomfortable and now feels she is intolerant to the [employer]. No real change in the wounds we have been using Sorbact. She has been previously intolerant to Iodoflex. There is not a lot of option about what we can use to debride this wound under compression that she no doubt needs. sHe states Ultram no longer works for her pain 4/29; no major change in the wounds slightly increased depth. Surface on the original medial wound perhaps somewhat improved however the more recent area on the lateral left ankle is 100% covered in very adherent debris we have been using Sorbact. She tolerates 4 layer compression well and her edema control is a lot better. She has not had to come in for a nurse check 5/6; no major change in the condition of the wounds. She did consent to debridement today which was done with some difficulty. Continuing Sorbact. She did not tolerate Iodoflex. She was in for a check of her compression the day after we wrapped her last week this was adjusted but nothing much was found 5/13; no major change in the condition or area of the wounds. I was able to get a fairly aggressive debridement done on the lateral left leg wound. Even using Sorbact under compression. She came back in on Friday to have the wrap changed. She says she felt uncomfortable on the lateral aspect of her ankle. She has a long history of chronic venous insufficiency including previous ablation surgery on this side. 5/20-Patient returns for wounds on left leg with both wounds covered in slough, with the lateral leg wound  larger in size, she has been in 3 layer compression and felt more comfortable, she describes pain in ankle, in leg and pins and needles in foot, and is about to try Pamelor for this 6/3; wounds on the left lateral and left medial leg. The area medially which is the most recent of the 2 seems to have had the largest increase in dimensions. We have been using Sorbac to try and debride the surface. She has been to see orthopedics [name], TUTT ([id number]) they apparently did a plain x-ray that was indeterminant. Diagnosed her with neuropathy and they have ordered an MRI to determine if there is underlying osteomyelitis. This was not high on my thought list but I suppose it is prudent. We have advised her to make an appointment with vein and vascular in [place]. She has a history of a left greater saphenous and accessory vein ablations I wonder if there is anything else that can be done from a surgical point of  view to help in these difficult refractory wounds. We have previously healed this wound on one occasion but it keeps on reopening [medial side] 6/10; deep tissue culture I did last week I think on the left medial wound showed both moderate E. coli and moderate staph aureus [MSSA]. She is going to require antibiotics and I have chosen Augmentin. We have been using Sorbact and we have made better looking wound surface on both sides but certainly no improvement in wound area. She was back in last Friday apparently for a dressing changes the wrap was hurting her outer left ankle. She has not managed to get a hold of vein and vascular in [place]. We are going to have to make her that appointment 6/17; patient is tolerating the Augmentin. She had an MRI that I think was ordered by orthopedic surgeon this did not show osteomyelitis or an abscess did suggest cellulitis. We have been using Sorbact to the lateral and medial ankles. We have been trying to arrange a follow-up appointment with vein  and vascular in [place] or did her original ablations. We apparently an area sent the request to vein and vascular in [facility] 6/24; patient has completed the Augmentin. We do not yet have a vein and vascular appointment in [place]. I am not sure what the issue is here we have asked her to call tomorrow. We are using Sorbact. Making some improvements and especially the medial wound. Both surfaces however look better medial and lateral. 7/1; the patient has been in contact with vein and vascular in [name] but has not yet received an appointment. Using Sorbact we have gradually improve the wound surface with no improvement in surface area. She is approved for Apligraf but the wound surface still is not completely viable. She has not had to come in for a dressing change 7/8; the patient has an appointment with vein and vascular on 7/31 which is a Friday afternoon. She is concerned about getting back here for [place] to dress her wounds. I think it is important to have them goal for her venous reflux/history of ablations etc. to see if anything else can be done. She apparently tested positive for 1 of the blood tests with regards to lupus and saw a rheumatologist. He has raised the issue of vasculitis again. I have had this thought in the past however the evidence seems overwhelming that this is a venous reflux etiology. If the rheumatologist tells me there is clinical and laboratory investigation is positive for lupus I will rethink this. 7/15; the patient's wound surfaces are quite a bit better. The medial area which was her original wound now has no depth although the lateral wound which was the more recent area actually appears larger. Both with viable surfaces which is indeed better. Using Sorbact. I wanted to use Apligraf on her however there is the issue of the vein and vascular appointment on 7/31 at 2:00 in the afternoon which would not allow her to get back to be rewrapped and they  would no doubt remove the graft 7/22; the patient's wound surfaces have moderate amount of debris although generally look better. The lateral one is larger with 2 small satellite areas superiorly. We are waiting for her vein and vascular appointment on 7/31. She has been approved for Apligraf which I would like to use after th 7/29; wound surfaces have improved no debridement is required we have been using Sorbact. She sees vein and vascular on Friday with this so question of whether  anything can be done to lessen the likelihood of recurrence and/or speed the healing of these areas. She is already had previous ablations. She no doubt has severe venous hypertension 8/5-Patient returns at 1 week, she was in [place] for 3 days by her podiatrist, we have been using so backed to the wound, she has increased pain in both the wounds on the left lower leg especially the more distal one on the lateral aspect 8/12-Patient returns at 1 week and she is agreeable to having debridement in both wounds on her left leg today. We have been using Sorbact, and vascular studies were reviewed at last visit 8/19; the patient arrives with her wounds fairly clean and no debridement is required. We have used Sorbact which is really done a nice job in cleaning up these very difficult wound surfaces. The patient saw Dr. Donzetta Matters of vascular surgery on 7/31. He did not feel that there was an arterial component. He felt that her treated greater saphenous vein is adequately addressed and that the small saphenous vein did not appear to be involved significantly. She was also noted to have deep venous reflux which is not treatable. Dr. Donzetta Matters mentioned the possibility of a central obstructive component leading to reflux and he offered her central venography. She wanted to discuss this or think about it. I have urged her to go ahead with this. She has had recurrent difficult wounds in these areas which do heal but after months in the  clinic. If there is anything that can be done to reduce the likelihood of this I think it is worth it. 9/2 she is still working towards getting follow-up with Dr. Donzetta Matters to schedule her CT. Things are quite a bit worse venography. I put Apligraf on 2 weeks ago on both wounds on the medial and lateral part of her left lower leg. She arrives in clinic today with 3 superficial additional wounds above the area laterally and one below the wound medially. She describes a lot of discomfort. I think these are probably wrapped injuries. Does not look like she has cellulitis. 07/20/2019 on evaluation today patient appears to be doing somewhat poorly in regard to her lower extremity ulcers. She in fact showed signs of erythema in fact we may even be dealing with an infection at this time. Unfortunately I am unsure if this is just infection or if indeed there may be some allergic reaction that occurred as a result of the Apligraf application. With that being said that would be unusual but nonetheless not impossible in this patient is one who is unfortunately allergic to quite a bit. Currently we have been using the Sorbact which seems to do as well as anything for her. I do think we may want to obtain JANYCE, CASALI ([id number]) a culture today to see if there is anything showing up there that may need to be addressed. 9/16; noted that last week the wounds look worse in 1 week follow-up of the Apligraf. Using Sorbact as of 2 days ago. She arrives with copious amounts of drainage and new skin breakdown on the back of the left calf. The wounds arm more substantial bilaterally. There is a fair amount of swelling in the left calf no overt DVT there is edema present I think in the left greater than right thigh. She is supposed to go on 9/28 for CT venography. The wounds on the medial and lateral calf are worse and she has new skin breakdown posteriorly at least new  for me. This is almost developing into a  circumferential wound area The Apligraf was taken off last week which I agree with things are not going in the right direction a culture was done we do not have that back yet. She is on Augmentin that she started 2 days ago 9/23; dressing was changed by her nurses on Monday. In general there is no improvement in the wound areas although the area looks less angry than last week. She did get Augmentin for MSSA cultured on the 14th. She still appears to have too much swelling in the left leg even with 3 layer compression 9/30; the patient underwent her procedure on 9/28 by Dr. Donzetta Matters at vascular and vein specialist. She was discovered to have the common iliac vein measuring 12.2 mm but at the level of L4-L5 measured 3 mm. After stenting it measured 10 mm. 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 Rouleaux flow in the common femoral and femoral vein was observed much improved after stenting. We are using silver alginate to the wounds on the medial and lateral ankle on the left. 4 layer compression 10/7; the patient had fluid swelling around her knee and 4 layer compression. At the advice of vein and vascular this was reduced to 3 layer which she is tolerating better. We have been using silver alginate under 3 layer compression since last Friday 10/14; arrives with the areas on the left ankle looking a lot better. Inflammation in the area also a lot better. She came in for a nurse check on 10/9 10/21; continued nice improvement. Slight improvements in surface area of both the medial and lateral wounds on the left. A lot of the satellite lesions in the weeping erythema around these from stasis dermatitis is resolved. We have been using silver alginate 10/28; general improvement in the entire wound areas although not a lot of change in dimensions the wound certainly looks better. There is a lot less in terms of venous inflammation. Continue silver alginate this week however look towards  Hydrofera Blue next week 11/4; very adherent debris on the medial wound left wound is not as bad. We have been using silver alginate. Change to [facility] today 11/11; very adherent debris on both wound areas. She went to vein and vascular last week and follow-up they put in [place] boot on this today. He says the [facility] was adherent. Wound is definitely not as good as last week. Especially on the left there the satellite lesions look more prominent 11/18; absolutely no better. erythema on lateral aspect with tenderness. 09/30/2019 on evaluation today patient appears to actually be doing better. Dr. Dellia Nims did put her on doxycycline last week which I do believe has helped her at this point. Fortunately there is no signs of active infection at this time. No fevers, chills, nausea, vomiting, or diarrhea. I do believe he may want extend the doxycycline for 7 additional days just to ensure everything does completely cleared up the patient is in agreement with that plan. Otherwise she is going require some sharp debridement today 12/2; patient is completing a 2-week course of doxycycline. I gave her this empirically for inflammation as well as infection when I last saw her 2 weeks ago. All of this seems to be better. She is using silver alginate she has the area on the medial aspect of the larger area laterally and the 2 small satellite regions laterally above the major wound. 12/9; the patient's wound on the left  medial and left lateral calf look really quite good. We have been using silver alginate. She saw vein and vascular in follow-up on 10/09/2019. She has had a previous left greater saphenous vein ablation by Dr. Oscar La in 2016. More recently she underwent a left common iliac vein stent by Dr. 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 The swelling is improved and certainly the wounds have improved. The patient shows [place] today area on the right medial calf there is almost no  wound but leaking lymphedema. She says she start this started 3 or 4 days ago. She did not traumatize it. It is not painful. She does not wear compression on that side 12/16; the patient continues to do well laterally. Medially still requiring debridement. The area on the right calf did not materialize to anything and is not currently open. We wrapped this last time. She has support stockings for that leg although I am not sure they are going to provide adequate compression 12/23; the lateral wound looks stable. Medially still requiring debridement for tightly adherent fibrinous debris. We've been using silver alginate. Surface area not any different 12/30; neither wound is any better with regards to surface and the area on the left lateral is larger. I been using silver alginate to the left lateral which look quite good last week and Sorbact to the left medial MESHELLE, DUNKLEE ([id number]) 11/11/2019. Lateral wound area actually looks better and somewhat smaller. Medial still requires a very aggressive debridement today. We have been using Sorbact on both wound areas 1/13; not much better still adherent debris bilaterally. I been using Sorbact. She has severe venous hypertension. Probably some degree of dermal fibrosis distally. I wonder whether tighter compression might help and I am going to try that today. We also need to work on the bioburden 1/20; using Sorbact. She has severe venous hypertension status post stent placement for pelvic vein compression. We applied gentamicin last time to see if we could reduce bioburden I had some discussion with her today about the use of pentoxifylline. This is occasionally used in this setting for wounds with refractory venous insufficiency. However this interacts with Plavix. She tells me that she was put on this after stent placement for 3 months. She will call Dr. Claretha Cooper office to discuss 1/27; we are using gentamicin under Sorbact. 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 She has a stent. Wound medially is measuring smaller this week. Laterally measuring slightly larger although she has some satellite lesions superiorly Objective Constitutional Sitting or standing Blood Pressure is within target range for patient.. Pulse regular and within target range for patient.Marland Kitchen Respirations regular, non-labored and within target range.. Temperature is normal and within the target range for the patient.Marland Kitchen appears in no distress. Vitals Time Taken: 12:47 PM, Height: 63 in, Weight: 224.7 lbs, BMI: 39.8, Temperature: 98.0 F, Pulse: 78 bpm, Respiratory Rate: 16 breaths/min, Blood Pressure: 134/70 mmHg. Respiratory Respiratory effort is easy and symmetric bilaterally. Rate is normal at rest and on room air.. Cardiovascular Pedal pulses are palpable. Excellent edema control. General Notes: Wound exam Both areas in the lateral and medial ankle on the left. Medial wound appears smaller. Very healthy looking surface which is nice to see. Laterally the surface looks healthy although there is no change in measurements. Satellite lesion superiorly. We have excellent edema control Integumentary (Hair, Skin) Wound #5 status is Open. Original cause of wound was Gradually Appeared. The wound is located on the Left,Medial Lower Leg.  The wound measures 2cm length x 2.2cm width x 0.2cm depth; 3.456cm^2 area and 0.691cm^3 volume. There is Fat Layer (Subcutaneous Tissue) Exposed exposed. There is no tunneling or undermining noted. There is a large amount of serosanguineous drainage noted. The wound margin is flat and intact. There is small (1-33%) pale granulation within the wound bed. There is a large (67-100%) amount of necrotic tissue within the wound bed including Adherent Slough. Wound #6 status is Open. Original cause of wound was Gradually Appeared. The wound is located on the Left,Lateral Lower Leg. The wound measures 5cm length x 2.5cm  width x 0.3cm depth; 9.817cm^2 area and 2.945cm^3 volume. There is Fat Layer (Subcutaneous Tissue) Exposed exposed. There is no tunneling or undermining noted. There is a large amount of serosanguineous drainage noted. The wound margin is flat and intact. There is small (1-33%) pink granulation within the wound bed. There is a large (67-100%) amount of necrotic tissue within the wound bed including Adherent Slough. Wound #9 status is Open. Original cause of wound was Gradually Appeared. The wound is located on the Salome ([id number]) Left,Lateral,Posterior Lower Leg. The wound measures 1cm length x 0.8cm width x 0.1cm depth; 0.628cm^2 area and 0.063cm^3 volume. There is Fat Layer (Subcutaneous Tissue) Exposed exposed. There is a large amount of serosanguineous drainage noted. The wound margin is flat and intact. There is small (1-33%) pink granulation within the wound bed. There is a large (67-100%) amount of necrotic tissue within the wound bed including Adherent Slough. Assessment Active Problems ICD-10 Non-pressure chronic ulcer of left calf limited to breakdown of skin Chronic venous hypertension (idiopathic) with inflammation of right lower extremity Lymphedema, not elsewhere classified Non-pressure chronic ulcer of other part of right lower leg with other specified severity Procedures Wound #5 Pre-procedure diagnosis of Wound #5 is a Lymphedema located on the Left,Medial Lower Leg . There was a Four Layer Compression Therapy Procedure with a pre-treatment ABI of 1 by Cornell Barman, RN. Post procedure Diagnosis Wound #5: Same as Pre-Procedure Plan Wound Cleansing: Wound #5 Left,Medial Lower Leg: Cleanse wound with mild soap and water Wound #6 Left,Lateral Lower Leg: Cleanse wound with mild soap and water Wound #9 Left,Lateral,Posterior Lower Leg: Cleanse wound with mild soap and water Anesthetic (add to Medication List): Wound #5 Left,Medial Lower Leg: Topical Lidocaine 4%  cream applied to wound bed prior to debridement (In Clinic Only). Benzocaine Topical Anesthetic Spray applied to wound bed prior to debridement (In Clinic Only). Wound #6 Left,Lateral Lower Leg: Topical Lidocaine 4% cream applied to wound bed prior to debridement (In Clinic Only). Benzocaine Topical Anesthetic Spray applied to wound bed prior to debridement (In Clinic Only). Wound #9 Left,Lateral,Posterior Lower Leg: Topical Lidocaine 4% cream applied to wound bed prior to debridement (In Clinic Only). Benzocaine Topical Anesthetic Spray applied to wound bed prior to debridement (In Clinic Only). Skin Barriers/Peri-Wound Care: Wound #5 Left,Medial Lower Leg: Other: - Gentamycin Wound #6 Left,Lateral Lower Leg: JALIYA, LAMONDA ([id number]) Other: - Gentamycin Wound #9 Left,Lateral,Posterior Lower Leg: Other: - Gentamycin Primary Wound Dressing: Wound #5 Left,Medial Lower Leg: Other: - sorbact Wound #6 Left,Lateral Lower Leg: Other: - sorbact Wound #9 Left,Lateral,Posterior Lower Leg: Other: - sorbact Secondary Dressing: Wound #5 Left,Medial Lower Leg: Drawtex Wound #6 Left,Lateral Lower Leg: Drawtex Wound #9 Left,Lateral,Posterior Lower Leg: Drawtex Dressing Change Frequency: Wound #5 Left,Medial Lower Leg: Change dressing every week Other: - as needed. Wound #6 Left,Lateral Lower Leg: Change dressing every week Other: - as needed. Wound #9 Left,Lateral,Posterior  Lower Leg: Change dressing every week Other: - as needed. Follow-up Appointments: Wound #5 Left,Medial Lower Leg: Return Appointment in 1 week. Nurse Visit as needed - Call of needed Wound #6 Left,Lateral Lower Leg: Return Appointment in 1 week. Nurse Visit as needed - Call of needed Wound #9 Left,Lateral,Posterior Lower Leg: Return Appointment in 1 week. Nurse Visit as needed - Call of needed Edema Control: Wound #5 Left,Medial Lower Leg: 4-Layer Compression System - Left Lower Extremity. Wound #6  Left,Lateral Lower Leg: 4-Layer Compression System - Left Lower Extremity. Wound #9 Left,Lateral,Posterior Lower Leg: 4-Layer Compression System - Left Lower Extremity. 1. I am continuing with the gentamicin covered by Sorbact. We have improvement in the wound surface on both sides and the area medially is slightly smaller 2. 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 She seems to be fine from this point of view she follows with vein and vascular 3. No current evidence of infection 4. Edema control is excellent Electronic Signature(s) Signed: 12/02/2019 4:18:13 PM By: Linton Ham MD THERA, ROSEKRANS ([id number]) Entered By: Linton Ham on 12/02/2019 13:36:19 HEATHERMARIE, SCHISLER ([id number]) -------------------------------------------------------------------------------- SuperBill Details Patient Name: SHAMICA, KUHR. Date of Service: 12/02/2019 Medical Record Number: [id number] Patient Account Number: [id number] Date of Birth/Sex: May 01, 1946 (74 y.o. F) Treating RN: Cornell Barman Primary Care Provider: [name] Other Clinician: Referring Provider: [name] Treating Provider/Extender: Tito Dine in Treatment: 51 Diagnosis Coding ICD-10 Codes Code Description [phone] Non-pressure chronic ulcer of left calf limited to breakdown of skin I87.321 Chronic venous hypertension (idiopathic) with inflammation of right lower extremity I89.0 Lymphedema, not elsewhere classified L97.818 Non-pressure chronic ulcer of other part of right lower leg with other specified severity Facility Procedures CPT4 Code: [id number] Description: (Facility Use Only) [id number] - [facility] LWR LT LEG Modifier: Quantity: 1 Physician Procedures CPT4 Code Description: [id number] - WC PHYS LEVEL 3 - EST PT ICD-10 Diagnosis Description L97.221 Non-pressure chronic ulcer of left calf limited to breakdown of I87.321 Chronic venous hypertension (idiopathic) with  inflammation of r I89.0 Lymphedema,  not elsewhere classified Modifier: skin ight lower extr Quantity: 1 emity Electronic Signature(s) Signed: 12/02/2019 4:18:13 PM By: Linton Ham MD Entered By: Linton Ham on 12/02/2019 13:37:15

## 2019-12-25 NOTE — Progress Notes (Signed)
Amber, [name] ([id number]) Visit Report for 12/02/2019 Arrival Information Details Patient Name: [name], [name]. Date of Service: 12/02/2019 12:45 PM Medical Record Number: [id number] Patient Account Number: [id number] Date of Birth/Sex: 1946-10-10 (74 y.o. F) Treating RN: Cornell Barman Primary Care Natonya Finstad: [name] Other Clinician: Referring Barney Russomanno: [name] Treating Mira Balon/Extender: Tito Dine in Treatment: 32 Visit Information History Since Last Visit Added or deleted any medications: No Patient Arrived: Ambulatory Has Dressing in Place as Prescribed: Yes Arrival Time: 12:47 Has Compression in Place as Prescribed: Yes Accompanied By: self Pain Present Now: No Transfer Assistance: None Patient Identification Verified: Yes Secondary Verification Process Yes Completed: Patient Requires Transmission-Based No Precautions: Patient Has Alerts: Yes Patient Alerts: Patient on Blood Thinner aspirin 81 Electronic Signature(s) Signed: 12/25/2019 11:50:22 AM By: [name], [name], RN, CWS, [name], [name] Entered By: [name], [name], RN, CWS, Kim on 12/02/2019 12:47:35 Hutzler, [name] -------------------------------------------------------------------------------- Compression Therapy Details Patient Name: Amber, [name]. Date of Service: 12/02/2019 12:45 PM Medical Record Number: [id number] Patient Account Number: [id number] Date of Birth/Sex: 04/29/1946 (74 y.o. F) Treating RN: Cornell Barman Primary Care Jelicia Nantz: [name] Other Clinician: Referring Lamaj Metoyer: [name] Treating Wilma Michaelson/Extender: Tito Dine in Treatment: 51 Compression Therapy Performed for Wound Assessment: Wound #5 Left,Medial Lower Leg Performed By: Clinician Cornell Barman, RN Compression Type: Four Layer Pre Treatment ABI: 1 Post Procedure Diagnosis Same as Pre-procedure Electronic Signature(s) Signed: 12/25/2019 11:50:22 AM By: [name], [name], RN, CWS, Kim  RN, [name] Entered By: [name], [name], RN, CWS, Kim on 12/02/2019 13:17:55 [name] -------------------------------------------------------------------------------- Encounter Discharge Information Details Patient Name: Amber, [name]. Date of Service: 12/02/2019 12:45 PM Medical Record Number: [id number] Patient Account Number: [id number] Date of Birth/Sex: 1946-01-20 (74 y.o. F) Treating RN: Cornell Barman Primary Care Sanjuanita Condrey: [name] Other Clinician: Referring Ismaeel Arvelo: [name] Treating Timaya Bojarski/Extender: Tito Dine in Treatment: 15 Encounter Discharge Information Items Discharge Condition: Stable Ambulatory Status: Ambulatory Discharge Destination: Home Transportation: Private Auto Accompanied By: self Schedule Follow-up Appointment: Yes Clinical Summary of Care: Electronic Signature(s) Signed: 12/25/2019 11:50:22 AM By: [name], [name], RN, CWS, [name], [name] Entered By: [name], [name], RN, CWS, Kim on 12/02/2019 13:19:55 [name] -------------------------------------------------------------------------------- Lower Extremity Assessment Details Patient Name: Amber, [name]. Date of Service: 12/02/2019 12:45 PM Medical Record Number: [id number] Patient Account Number: [id number] Date of Birth/Sex: [birth year] (74 y.o. F) Treating RN: Cornell Barman Primary Care Gabe Glace: [name] Other Clinician: Referring Wreatha Sturgeon: [name] Treating Reynold Mantell/Extender: Tito Dine in Treatment: 51 Edema Assessment Assessed: [Left: Yes] [Right: No] Edema: [Left: Ye] [Right: s] Calf Left: Right: Point of Measurement: 33 cm From Medial Instep 41.5 cm cm Ankle Left: Right: Point of Measurement: 10 cm From Medial Instep 22 cm cm Vascular Assessment Pulses: Dorsalis Pedis Palpable: [Left:Yes] Electronic Signature(s) Signed: 12/25/2019 11:50:22 AM By: [name], [name], RN, CWS, [name], [name] Entered By: [name], [name], RN, CWS,  Kim on 12/02/2019 12:59:12 Paczkowski, [name] -------------------------------------------------------------------------------- Multi Wound Chart Details Patient Name: [name], [name]. Date of Service: 12/02/2019 12:45 PM Medical Record Number: [id number] Patient Account Number: [id number] Date of Birth/Sex: [birth year] (74 y.o. F) Treating RN: Cornell Barman Primary Care Chynna Buerkle: [name] Other Clinician: Referring Frank Pilger: [name] Treating Laretha Luepke/Extender: Tito Dine in Treatment: 51 Vital Signs Height(in): 63 Pulse(bpm): 78 Weight(lbs): 224.7 Blood Pressure(mmHg): 134/70 Body Mass Index(BMI): 40 Temperature(F): 98.0 Respiratory Rate 16 (breaths/min): Photos: Wound Location: Left Lower Leg - Medial Left Lower Leg -  Lateral Left Lower Leg - Lateral, Posterior Wounding Event: Gradually Appeared Gradually Appeared Gradually Appeared Primary Etiology: Lymphedema Venous Leg Ulcer Venous Leg Ulcer Comorbid History: Cataracts, Asthma, Sleep Cataracts, Asthma, Sleep Cataracts, Asthma, Sleep Apnea, Deep Vein Apnea, Deep Vein Apnea, Deep Vein Thrombosis, Hypertension, Thrombosis, Hypertension, Thrombosis, Hypertension, Peripheral Venous Disease, Peripheral Venous Disease, Peripheral Venous Disease, Osteoarthritis, Received Osteoarthritis, Received Osteoarthritis, Received Chemotherapy, Received Chemotherapy, Received Chemotherapy, Received Radiation Radiation Radiation Date Acquired: 11/19/2018 01/19/2019 07/08/2019 Weeks of Treatment: 51 45 21 Wound Status: Open Open Open Measurements L x W x D 2x2.2x0.2 5x2.5x0.3 1x0.8x0.1 (cm) Area (cm) : 3.456 9.817 0.628 Volume (cm) : 0.691 2.945 0.063 % Reduction in Area: 59.60% -4362.30% -11.20% % Reduction in Volume: 19.20% -13286.40% -10.50% Classification: Full Thickness Without Full Thickness Without Full Thickness Without Exposed Support Structures Exposed Support Structures Exposed Support  Structures Exudate Amount: Large Large Large Exudate Type: Serosanguineous Serosanguineous Serosanguineous Exudate Color: red, brown red, brown red, brown Wound Margin: Flat and Intact Flat and Intact Flat and Intact Granulation Amount: Small (1-33%) Small (1-33%) Small (1-33%) Granulation Quality: Pale Pink Pink Necrotic Amount: Large (67-100%) Large (67-100%) Large (67-100%) Slider, Quenesha J. ([id number]) Exposed Structures: Fat Layer (Subcutaneous Fat Layer (Subcutaneous Fat Layer (Subcutaneous Tissue) Exposed: Yes Tissue) Exposed: Yes Tissue) Exposed: Yes Fascia: No Fascia: No Fascia: No Tendon: No Tendon: No Tendon: No Muscle: No Muscle: No Muscle: No Joint: No Joint: No Joint: No Bone: No Bone: No Bone: No Epithelialization: None None None Procedures Performed: Compression Therapy N/A N/A Treatment Notes Wound #5 (Left, Medial Lower Leg) Wound #6 (Left, Lateral Lower Leg) Wound #9 (Left, Lateral, Posterior Lower Leg) Electronic Signature(s) Signed: 12/02/2019 4:18:13 PM By: Linton Ham MD Entered By: Linton Ham on 12/02/2019 13:31:33 Murley, [name] -------------------------------------------------------------------------------- Multi-Disciplinary Care Plan Details Patient Name: KARIS, TEEGARDEN. Date of Service: 12/02/2019 12:45 PM Medical Record Number: [id number] Patient Account Number: [id number] Date of Birth/Sex: 1946-03-26 (74 y.o. F) Treating RN: Cornell Barman Primary Care Hildred Pharo: [name] Other Clinician: Referring Wyolene Weimann: [name] Treating Lexee Brashears/Extender: Tito Dine in Treatment: 33 Active Inactive Orientation to the Wound Care Program Nursing Diagnoses: Knowledge deficit related to the wound healing center program Goals: Patient/caregiver will verbalize understanding of the [facility] Date Initiated: 12/10/2018 Target Resolution Date: 01/09/2019 Goal Status:  Active Interventions: Provide education on orientation to the wound center Notes: Soft Tissue Infection Nursing Diagnoses: Impaired tissue integrity Goals: Patient's soft tissue infection will resolve Date Initiated: 12/10/2018 Target Resolution Date: 01/09/2019 Goal Status: Active Interventions: Assess signs and symptoms of infection every visit Notes: Venous Leg Ulcer Nursing Diagnoses: Actual venous Insuffiency (use after diagnosis is confirmed) Goals: Patient will maintain optimal edema control Date Initiated: 12/10/2018 Target Resolution Date: 01/09/2019 Goal Status: Active Interventions: Assess peripheral edema status every visit. RAPHAELA, WILLITTS ([id number]) Treatment Activities: Therapeutic compression applied : 12/10/2018 Notes: Wound/Skin Impairment Nursing Diagnoses: Impaired tissue integrity Goals: Patient/caregiver will verbalize understanding of skin care regimen Date Initiated: 12/10/2018 Target Resolution Date: 01/09/2019 Goal Status: Active Interventions: Assess ulceration(s) every visit Treatment Activities: Topical wound management initiated : 12/10/2018 Notes: Electronic Signature(s) Signed: 12/25/2019 11:50:22 AM By: [name], [name], RN, CWS, [name], [name] Entered By: [name], [name], RN, CWS, Kim on 12/02/2019 12:59:21 MICHIKO, MANDLER ([id number]) -------------------------------------------------------------------------------- Pain Assessment Details Patient Name: KAMALPREET, SEMAN. Date of Service: 12/02/2019 12:45 PM Medical Record Number: [id number] Patient Account Number: [id number] Date of Birth/Sex: [name] 22, 1946 (74 y.o. F) Treating RN: Cornell Barman Primary Care Dionne Knoop: [name] Other  Clinician: Referring Markala Sitts: [name] Treating Zyshawn Bohnenkamp/Extender: Tito Dine in Treatment: 51 Active Problems Location of Pain Severity and Description of Pain Patient Has Paino No Site Locations Pain Management and Medication Current Pain  Management: Notes Patient denies pain at this time. Electronic Signature(s) Signed: 12/25/2019 11:50:22 AM By: [name], [name], RN, CWS, [name], [name] Entered By: [name], [name], RN, CWS, Kim on 12/02/2019 12:47:57 Crandall, [name] -------------------------------------------------------------------------------- Patient/Caregiver Education Details Patient Name: VERNIA, FREDA. Date of Service: 12/02/2019 12:45 PM Medical Record Number: [id number] Patient Account Number: [id number] Date of Birth/Gender: [birth year] (74 y.o. F) Treating RN: Cornell Barman Primary Care Physician: [name] Other Clinician: Referring Physician: [name] Treating Physician/Extender: Tito Dine in Treatment: 6 Education Assessment Education Provided To: Patient Education Topics Provided Welcome To [facility]: Handouts: Welcome To [facility] [place] Methods: Demonstration, Explain/Verbal Responses: State content correctly Wound/Skin Impairment: Handouts: Caring for Your Ulcer Methods: Demonstration, Explain/Verbal Responses: State content correctly Electronic Signature(s) Signed: 12/25/2019 11:50:22 AM By: [name], [name], RN, CWS, [name], [name] Entered By: [name], [name], RN, CWS, Kim on 12/02/2019 13:18:54 [name] -------------------------------------------------------------------------------- Wound Assessment Details Patient Name: ZAKARI, MAROTTE. Date of Service: 12/02/2019 12:45 PM Medical Record Number: [id number] Patient Account Number: [id number] Date of Birth/Sex: October 02, 1946 (74 y.o. F) Treating RN: Cornell Barman Primary Care Eileen Croswell: [name] Other Clinician: Referring Norah [name]: [name] Treating Waniya Hoglund/Extender: Tito Dine in Treatment: 51 Wound Status Wound Number: 5 Primary Lymphedema Etiology: Wound Location: Left Lower Leg - Medial Wound Open Wounding Event: Gradually Appeared Status: Date Acquired:  11/19/2018 Comorbid Cataracts, Asthma, Sleep Apnea, Deep Vein Weeks Of Treatment: 51 History: Thrombosis, Hypertension, Peripheral Venous Clustered Wound: No Disease, Osteoarthritis, Received Chemotherapy, Received Radiation Photos Wound Measurements Length: (cm) 2 Width: (cm) 2.2 Depth: (cm) 0.2 Area: (cm) 3.456 Volume: (cm) 0.691 % Reduction in Area: 59.6% % Reduction in Volume: 19.2% Epithelialization: None Tunneling: No Undermining: No Wound Description Full Thickness Without Exposed Support Classification: Structures Wound Margin: Flat and Intact Exudate Large Amount: Exudate Type: Serosanguineous Exudate Color: red, brown Foul Odor After Cleansing: No Slough/Fibrino Yes Wound Bed Granulation Amount: Small (1-33%) Exposed Structure Granulation Quality: Pale Fascia Exposed: No Necrotic Amount: Large (67-100%) Fat Layer (Subcutaneous Tissue) Exposed: Yes Necrotic Quality: Adherent Slough Tendon Exposed: No Muscle Exposed: No Joint Exposed: No Bone Exposed: No NICOLEMARIE, AMAN ([id number]) Electronic Signature(s) Signed: 12/25/2019 11:50:22 AM By: [name], [name], RN, CWS, [name], [name] Entered By: [name], [name], RN, CWS, Kim on 12/02/2019 12:57:29 Ober, [name] -------------------------------------------------------------------------------- Wound Assessment Details Patient Name: AMARIYAH, LINGEN. Date of Service: 12/02/2019 12:45 PM Medical Record Number: [id number] Patient Account Number: [id number] Date of Birth/Sex: [birth year] (74 y.o. F) Treating RN: Cornell Barman Primary Care Zitlaly Malson: [name] Other Clinician: Referring Seaborn Nakama: [name] Treating Yohana Bartha/Extender: Tito Dine in Treatment: 51 Wound Status Wound Number: 6 Primary Venous Leg Ulcer Etiology: Wound Location: Left Lower Leg - Lateral Wound Open Wounding Event: Gradually Appeared Status: Date Acquired: 01/19/2019 Comorbid Cataracts, Asthma, Sleep Apnea,  Deep Vein Weeks Of Treatment: 45 History: Thrombosis, Hypertension, Peripheral Venous Clustered Wound: No Disease, Osteoarthritis, Received Chemotherapy, Received Radiation Photos Wound Measurements Length: (cm) 5 Width: (cm) 2.5 Depth: (cm) 0.3 Area: (cm) 9.817 Volume: (cm) 2.945 % Reduction in Area: -4362.3% % Reduction in Volume: -13286.4% Epithelialization: None Tunneling: No Undermining: No Wound Description Full Thickness Without Exposed Support Classification: Structures Wound Margin: Flat and Intact Exudate Large Amount: Exudate Type:  Serosanguineous Exudate Color: red, brown Foul Odor After Cleansing: No Slough/Fibrino Yes Wound Bed Granulation Amount: Small (1-33%) Exposed Structure Granulation Quality: Pink Fascia Exposed: No Necrotic Amount: Large (67-100%) Fat Layer (Subcutaneous Tissue) Exposed: Yes Necrotic Quality: Adherent Slough Tendon Exposed: No Muscle Exposed: No Joint Exposed: No Bone Exposed: No JAZELYN, RUMBOLD ([id number]) Electronic Signature(s) Signed: 12/25/2019 11:50:22 AM By: [name], [name], RN, CWS, [name], [name] Entered By: [name], [name], RN, CWS, Kim on 12/02/2019 12:57:57 Teodoro, [name] -------------------------------------------------------------------------------- Wound Assessment Details Patient Name: SHAQUOIA, COHAN. Date of Service: 12/02/2019 12:45 PM Medical Record Number: [id number] Patient Account Number: [id number] Date of Birth/Sex: [birth year] (74 y.o. F) Treating RN: Cornell Barman Primary Care Salayah Meares: [name] Other Clinician: Referring Panayiota Larkin: [name] Treating Harlei Lehrmann/Extender: Tito Dine in Treatment: 51 Wound Status Wound Number: 9 Primary Venous Leg Ulcer Etiology: Wound Location: Left Lower Leg - Lateral, Posterior Wound Open Wounding Event: Gradually Appeared Status: Date Acquired: 07/08/2019 Comorbid Cataracts, Asthma, Sleep Apnea, Deep Vein Weeks Of Treatment:  21 History: Thrombosis, Hypertension, Peripheral Venous Clustered Wound: No Disease, Osteoarthritis, Received Chemotherapy, Received Radiation Photos Wound Measurements Length: (cm) 1 Width: (cm) 0.8 Depth: (cm) 0.1 Area: (cm) 0.628 Volume: (cm) 0.063 % Reduction in Area: -11.2% % Reduction in Volume: -10.5% Epithelialization: None Wound Description Full Thickness Without Exposed Support Classification: Structures Wound Margin: Flat and Intact Exudate Large Amount: Exudate Type: Serosanguineous Exudate Color: red, brown Foul Odor After Cleansing: No Slough/Fibrino Yes Wound Bed Granulation Amount: Small (1-33%) Exposed Structure Granulation Quality: Pink Fascia Exposed: No Necrotic Amount: Large (67-100%) Fat Layer (Subcutaneous Tissue) Exposed: Yes Necrotic Quality: Adherent Slough Tendon Exposed: No Muscle Exposed: No Joint Exposed: No Bone Exposed: No AUNGELIQUE, DECLARK ([id number]) Electronic Signature(s) Signed: 12/25/2019 11:50:22 AM By: [name], [name], RN, CWS, [name], [name] Entered By: [name], [name], RN, CWS, Kim on 12/02/2019 12:58:23 [name] -------------------------------------------------------------------------------- Vitals Details Patient Name: DEZARAE, ORDUNA. Date of Service: 12/02/2019 12:45 PM Medical Record Number: [id number] Patient Account Number: [id number] Date of Birth/Sex: January 26, 1946 (74 y.o. F) Treating RN: Cornell Barman Primary Care Zelta Enfield: [name] Other Clinician: Referring Alizah Sills: [name] Treating Yasir Kitner/Extender: Tito Dine in Treatment: 51 Vital Signs Time Taken: 12:47 Temperature (F): 98.0 Height (in): 63 Pulse (bpm): 78 Weight (lbs): 224.7 Respiratory Rate (breaths/min): 16 Body Mass Index (BMI): 39.8 Blood Pressure (mmHg): 134/70 Reference Range: 80 - 120 mg / dl Electronic Signature(s) Signed: 12/25/2019 11:50:22 AM By: [name], [name], RN, CWS, [name], [name] Entered By: [name],  [name], RN, CWS, Kim on 12/02/2019 12:48:22

## 2019-12-27 ENCOUNTER — Other Ambulatory Visit: Payer: Self-pay | Admitting: Family Medicine

## 2019-12-28 ENCOUNTER — Other Ambulatory Visit: Payer: Self-pay

## 2019-12-28 DIAGNOSIS — I890 Lymphedema, not elsewhere classified: Secondary | ICD-10-CM | POA: Diagnosis not present

## 2019-12-28 DIAGNOSIS — I87332 Chronic venous hypertension (idiopathic) with ulcer and inflammation of left lower extremity: Secondary | ICD-10-CM | POA: Diagnosis not present

## 2019-12-28 DIAGNOSIS — J45909 Unspecified asthma, uncomplicated: Secondary | ICD-10-CM | POA: Diagnosis not present

## 2019-12-28 DIAGNOSIS — I10 Essential (primary) hypertension: Secondary | ICD-10-CM | POA: Diagnosis not present

## 2019-12-28 DIAGNOSIS — L97221 Non-pressure chronic ulcer of left calf limited to breakdown of skin: Secondary | ICD-10-CM | POA: Diagnosis not present

## 2019-12-28 DIAGNOSIS — I872 Venous insufficiency (chronic) (peripheral): Secondary | ICD-10-CM | POA: Diagnosis not present

## 2019-12-28 NOTE — Progress Notes (Signed)
CATELAYA, CHOKSI ([id number]) Visit Report for 12/28/2019 Arrival Information Details Patient Name: [name], [name]. Date of Service: 12/28/2019 12:30 PM Medical Record Number: [id number] Patient Account Number: [id number] Date of Birth/Sex: 1945/11/28 (75 y.o. F) Treating RN: Montey Hora Primary Care Achille Xiang: [name] Other Clinician: Referring Michiah Masse: [name] Treating Isayah Ignasiak/Extender: Melburn Hake, HOYT Weeks in Treatment: 29 Visit Information History Since Last Visit Added or deleted any medications: No Patient Arrived: Ambulatory Any new allergies or adverse reactions: No Arrival Time: 12:36 Had a fall or experienced change in No Accompanied By: self activities of daily living that may affect Transfer Assistance: None risk of falls: Patient Identification Verified: Yes Signs or symptoms of abuse/neglect since last visito No Secondary Verification Process Completed: Yes Hospitalized since last visit: No Patient Requires Transmission-Based No Implantable device outside of the clinic excluding No Precautions: cellular tissue based products placed in the center Patient Has Alerts: Yes since last visit: Patient Alerts: Patient on Blood Has Dressing in Place as Prescribed: Yes Thinner Has Compression in Place as Prescribed: Yes aspirin 81 Pain Present Now: No Electronic Signature(s) Signed: 12/28/2019 4:53:18 PM By: Montey Hora Entered By: Montey Hora on 12/28/2019 12:37:16 Powless, [name] ([id number]) -------------------------------------------------------------------------------- Compression Therapy Details Patient Name: [name], [name]. Date of Service: 12/28/2019 12:30 PM Medical Record Number: [id number] Patient Account Number: [id number] Date of Birth/Sex: 07-14-1946 (74 y.o. F) Treating RN: Montey Hora Primary Care [name] [name]: [name] Other Clinician: Referring Anis Cinelli: [name] Treating Chelsy Parrales/Extender: STONE III,  HOYT Weeks in Treatment: 54 Compression Therapy Performed for Wound Assessment: Wound #5 Left,Medial Lower Leg Performed By: Clinician Montey Hora, RN Compression Type: Four Layer Pre Treatment ABI: 1 Electronic Signature(s) Signed: 12/28/2019 4:53:18 PM By: Montey Hora Entered By: Montey Hora on 12/28/2019 12:59:50 Rinehart, [name] ([id number]) -------------------------------------------------------------------------------- Compression Therapy Details Patient Name: [name], [name]. Date of Service: 12/28/2019 12:30 PM Medical Record Number: [id number] Patient Account Number: [id number] Date of Birth/Sex: Feb 15, 1946 (74 y.o. F) Treating RN: Montey Hora Primary Care Shemuel Harkleroad: [name] Other Clinician: Referring Ka Bench: [name] Treating Barkley Kratochvil/Extender: STONE III, HOYT Weeks in Treatment: 54 Compression Therapy Performed for Wound Assessment: Wound #6 Left,Lateral Lower Leg Performed By: Clinician Montey Hora, RN Compression Type: Four Layer Pre Treatment ABI: 1 Electronic Signature(s) Signed: 12/28/2019 4:53:18 PM By: Montey Hora Entered By: Montey Hora on 12/28/2019 12:59:50 Seavey, [name] ([id number]) -------------------------------------------------------------------------------- Compression Therapy Details Patient Name: [name], [name]. Date of Service: 12/28/2019 12:30 PM Medical Record Number: [id number] Patient Account Number: [id number] Date of Birth/Sex: 23-Sep-1946 (74 y.o. F) Treating RN: Montey Hora Primary Care Ceonna Frazzini: [name] Other Clinician: Referring Eyana Stolze: [name] Treating Shandra Szymborski/Extender: STONE III, HOYT Weeks in Treatment: 54 Compression Therapy Performed for Wound Assessment: Wound #9 Left,Lateral,Posterior Lower Leg Performed By: Clinician Montey Hora, RN Compression Type: Four Layer Pre Treatment ABI: 1 Electronic Signature(s) Signed: 12/28/2019 4:53:18 PM By: Montey Hora Entered By:  Montey Hora on 12/28/2019 12:59:50 Kowalke, [name] ([id number]) -------------------------------------------------------------------------------- Encounter Discharge Information Details Patient Name: [name], [name]. Date of Service: 12/28/2019 12:30 PM Medical Record Number: [id number] Patient Account Number: [id number] Date of Birth/Sex: [birth year] (74 y.o. F) Treating RN: Montey Hora Primary Care Demaurion Dicioccio: [name] Other Clinician: Referring Ryeleigh Santore: [name] Treating Jaquitta Dupriest/Extender: Melburn Hake, HOYT Weeks in Treatment: 28 Encounter Discharge Information Items Discharge Condition: Stable Ambulatory Status: Ambulatory Discharge Destination: Home Transportation: Private Auto Accompanied By: self Schedule Follow-up Appointment: Yes Clinical Summary of Care: Electronic Signature(s) Signed: 12/28/2019 1:10:21 PM By: Montey Hora Entered By: Marjory Lies,  Joanna on 12/28/2019 13:10:21 VAYA, GHERARDI ([id number]) -------------------------------------------------------------------------------- Wound Assessment Details Patient Name: [name], [name]. Date of Service: 12/28/2019 12:30 PM Medical Record Number: [id number] Patient Account Number: [id number] Date of Birth/Sex: [birth year] (74 y.o. F) Treating RN: Montey Hora Primary Care Evon Lopezperez: [name] Other Clinician: Referring Melissaann Dizdarevic: [name] Treating Amahd Morino/Extender: STONE III, HOYT Weeks in Treatment: 43 Wound Status Wound Number: 5 Primary Lymphedema Etiology: Wound Location: Left Lower Leg - Medial Wound Open Wounding Event: Gradually Appeared Status: Date Acquired: 11/19/2018 Comorbid Cataracts, Asthma, Sleep Apnea, Deep Vein Thrombosis, Weeks Of Treatment: 54 History: Hypertension, Peripheral Venous Disease, Osteoarthritis, Clustered Wound: No Received Chemotherapy, Received Radiation Photos Wound Measurements Length: (cm) 2.3 Width: (cm) 2.2 Depth: (cm) 0.2 Area: (cm)  3.974 Volume: (cm) 0.795 % Reduction in Area: 53.5% % Reduction in Volume: 7% Epithelialization: None Wound Description Classification: Full Thickness Without Exposed Support Structu Wound Margin: Flat and Intact Exudate Amount: Large Exudate Type: Serosanguineous Exudate Color: red, brown res Foul Odor After Cleansing: No Slough/Fibrino Yes Wound Bed Granulation Amount: Small (1-33%) Exposed Structure Granulation Quality: Pale Fascia Exposed: No Necrotic Amount: Large (67-100%) Fat Layer (Subcutaneous Tissue) Exposed: Yes Necrotic Quality: Adherent Slough Tendon Exposed: No Muscle Exposed: No Joint Exposed: No Bone Exposed: No Treatment Notes Wound #5 (Left, Medial Lower Leg) Notes gentamicin cream, sorbact, drawtex, 4 layer wrap with unna to anchor Electronic Signature(s) PAHAL, BONIFIELD ([id number]) Signed: 12/28/2019 4:53:18 PM By: Montey Hora Entered By: Montey Hora on 12/28/2019 12:45:58 Taffe, [name] ([id number]) -------------------------------------------------------------------------------- Wound Assessment Details Patient Name: [name], [name]. Date of Service: 12/28/2019 12:30 PM Medical Record Number: [id number] Patient Account Number: [id number] Date of Birth/Sex: Mar 06, 1946 (74 y.o. F) Treating RN: Montey Hora Primary Care Sallyanne Birkhead: [name] Other Clinician: Referring Maricela Kawahara: [name] Treating Nikolaj Geraghty/Extender: STONE III, HOYT Weeks in Treatment: 86 Wound Status Wound Number: 6 Primary Venous Leg Ulcer Etiology: Wound Location: Left Lower Leg - Lateral Wound Open Wounding Event: Gradually Appeared Status: Date Acquired: 01/19/2019 Comorbid Cataracts, Asthma, Sleep Apnea, Deep Vein Thrombosis, Weeks Of Treatment: 49 History: Hypertension, Peripheral Venous Disease, Osteoarthritis, Clustered Wound: No Received Chemotherapy, Received Radiation Photos Wound Measurements Length: (cm) 3.6 Width: (cm) 2.5 Depth: (cm)  0.2 Area: (cm) 7.069 Volume: (cm) 1.414 % Reduction in Area: -3113.2% % Reduction in Volume: -6327.3% Epithelialization: None Wound Description Classification: Full Thickness Without Exposed Support Structu Wound Margin: Flat and Intact Exudate Amount: Large Exudate Type: Serosanguineous Exudate Color: red, brown res Foul Odor After Cleansing: No Slough/Fibrino Yes Wound Bed Granulation Amount: Small (1-33%) Exposed Structure Granulation Quality: Pink Fascia Exposed: No Necrotic Amount: Large (67-100%) Fat Layer (Subcutaneous Tissue) Exposed: Yes Necrotic Quality: Adherent Slough Tendon Exposed: No Muscle Exposed: No Joint Exposed: No Bone Exposed: No Treatment Notes Wound #6 (Left, Lateral Lower Leg) Notes gentamicin cream, sorbact, drawtex, 4 layer wrap with unna to anchor Electronic Signature(s) AITIANA, CLEVERINGA ([id number]) Signed: 12/28/2019 4:53:18 PM By: Montey Hora Entered By: Montey Hora on 12/28/2019 12:46:21 Thall, [name] ([id number]) -------------------------------------------------------------------------------- Wound Assessment Details Patient Name: [name], [name]. Date of Service: 12/28/2019 12:30 PM Medical Record Number: [id number] Patient Account Number: [id number] Date of Birth/Sex: 1946/08/05 (74 y.o. F) Treating RN: Montey Hora Primary Care Takari Lundahl: [name] Other Clinician: Referring Jarvis Sawa: [name] Treating Modesty Rudy/Extender: STONE III, HOYT Weeks in Treatment: 59 Wound Status Wound Number: 9 Primary Venous Leg Ulcer Etiology: Wound Location: Left Lower Leg - Lateral, Posterior Wound Open Wounding Event: Gradually Appeared Status: Date Acquired: 07/08/2019 Comorbid Cataracts, Asthma, Sleep Apnea, Deep  Vein Thrombosis, Weeks Of Treatment: 24 History: Hypertension, Peripheral Venous Disease, Osteoarthritis, Clustered Wound: No Received Chemotherapy, Received Radiation Photos Wound Measurements Length: (cm)  0.7 Width: (cm) 0.8 Depth: (cm) 0.1 Area: (cm) 0.44 Volume: (cm) 0.044 % Reduction in Area: 22.1% % Reduction in Volume: 22.8% Epithelialization: None Wound Description Classification: Full Thickness Without Exposed Support Structu Wound Margin: Flat and Intact Exudate Amount: Large Exudate Type: Serosanguineous Exudate Color: red, brown res Foul Odor After Cleansing: No Slough/Fibrino Yes Wound Bed Granulation Amount: Small (1-33%) Exposed Structure Granulation Quality: Pink Fascia Exposed: No Necrotic Amount: Large (67-100%) Fat Layer (Subcutaneous Tissue) Exposed: Yes Necrotic Quality: Adherent Slough Tendon Exposed: No Muscle Exposed: No Joint Exposed: No Bone Exposed: No Treatment Notes Wound #9 (Left, Lateral, Posterior Lower Leg) Notes gentamicin cream, sorbact, drawtex, 4 layer wrap with unna to anchor Electronic Signature(s) SHAKIELA, MINKOFF ([id number]) Signed: 12/28/2019 4:53:18 PM By: Montey Hora Entered By: Montey Hora on 12/28/2019 12:46:45

## 2019-12-30 ENCOUNTER — Other Ambulatory Visit: Payer: Self-pay

## 2019-12-30 ENCOUNTER — Encounter: Payer: Medicare Other | Admitting: Internal Medicine

## 2019-12-30 DIAGNOSIS — I10 Essential (primary) hypertension: Secondary | ICD-10-CM | POA: Diagnosis not present

## 2019-12-30 DIAGNOSIS — I87332 Chronic venous hypertension (idiopathic) with ulcer and inflammation of left lower extremity: Secondary | ICD-10-CM | POA: Diagnosis not present

## 2019-12-30 DIAGNOSIS — L97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d: Secondary | ICD-10-CM | POA: Diagnosis not present

## 2019-12-30 DIAGNOSIS — I87312 Chronic venous hypertension (idiopathic) with ulcer of left lower extremity: Secondary | ICD-10-CM | POA: Diagnosis not present

## 2019-12-30 DIAGNOSIS — J45909 Unspecified asthma, uncomplicated: Secondary | ICD-10-CM | POA: Diagnosis not present

## 2019-12-30 DIAGNOSIS — I872 Venous insufficiency (chronic) (peripheral): Secondary | ICD-10-CM | POA: Diagnosis not present

## 2019-12-30 DIAGNOSIS — L97221 Non-pressure chronic ulcer of left calf limited to breakdown of skin: Secondary | ICD-10-CM | POA: Diagnosis not present

## 2019-12-30 DIAGNOSIS — L97222 Non-pressure chronic ulcer of left calf with fat layer exposed: Secondary | ICD-10-CM | POA: Diagnosis not present

## 2019-12-30 DIAGNOSIS — I890 Lymphedema, not elsewhere classified: Secondary | ICD-10-CM | POA: Diagnosis not present

## 2019-12-30 NOTE — Progress Notes (Signed)
BEAUTIFUL, KERSCHNER ([id number]) Visit Report for 12/30/2019 HPI Details Patient Name: [name], [name]. Date of Service: 12/30/2019 12:45 PM Medical Record Number: [id number] Patient Account Number: [id number] Date of Birth/Sex: 15-Oct-1946 (74 y.o. F) Treating RN: Cornell Barman Primary Care Provider: [name] Other Clinician: Referring Provider: [name] Treating Provider/Extender: Tito Dine in Treatment: 48 History of Present Illness HPI Description: Pleasant 74 year old with history of chronic venous insufficiency. No diabetes or peripheral vascular disease. Left ABI 1.29. Questionable history of left lower extremity DVT. She developed a recurrent ulceration on her left lateral calf in December 2015, which she attributes to poor diet and subsequent lower extremity edema. She underwent endovenous laser ablation of her left greater saphenous vein in 2010. She underwent laser ablation of accessory branch of left GSV in April 2016 by Dr. Kellie Simmering at [facility]. She was previously wearing Unna boots, which she tolerated well. Tolerating 2 layer compression and cadexomer iodine. She returns to clinic for follow-up and is without new complaints. She denies any significant pain at this time. She reports persistent pain with pressure. No claudication or ischemic rest pain. No fever or chills. No drainage. READMISSION 11/13/16; this is a 74 year old woman who is not a diabetic. She is here for a review of a painful area on her left medial lower extremity. I note that she was seen here previously last year for wound I believe to be in the same area. At that time she had undergone previously a left greater saphenous vein ablation by Dr. Kellie Simmering and she had a ablation of the anterior accessory branch of the left greater saphenous vein in March 2016. Seeing that the wound actually closed over. In reviewing the history with her today the ulcer in this area has been recurrent. She describes  a biopsy of this area in 2009 that only showed stasis physiology. 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 Do not see that she has ever had arterial studies in the left leg. She tells me she has a follow-up with Dr. Kellie Simmering in roughly 10 days In any case she developed the reopening of this area roughly a month ago. On the background of this she describes rapidly increasing edema which has responded to Lasix 40 mg and metolazone 2.5 mg as well as the patient's lymph massage. She has been told she has both venous insufficiency and lymphedema but she cannot tolerate compression stockings 11/28/16; the patient saw Dr. Kellie Simmering recently. Per the patient he did arterial Dopplers in the office that did not show evidence of arterial insufficiency, per the patient he stated "treat this like an ordinary venous ulcer". She also saw her dermatologist Dr. Ronnald Ramp who felt that this was more of a vascular ulcer. In general things are improving although she arrives today with increasing bilateral lower extremity edema with weeping a deeper fluid through the wound on the left medial leg compatible with some degree of lymphedema 12/04/16; the patient's wound is fully epithelialized but I don't think fully healed. We will do another week of depression with Promogran and TCA however I suspect we'll be able to discharge her next week. This is a very unusual-looking wound which was initially a figure-of-eight type wound lying on its side surrounded by petechial like hemorrhage. She  has had venous ablation on this side. She apparently does not have an arterial issue per Dr. Kellie Simmering. She saw her dermatologist thought it was "vascular". Patient is definitely going to need ongoing compression and I talked about this  with her today she will go to elastic therapy after she leaves here next week 12/11/16; the patient's wound is not completely closed today. She has surrounding scar tissue and in further discussion with the patient it would appear that she had ulcers in this area in 2009 for a prolonged period of time ultimately requiring a punch biopsy of this area that only showed venous insufficiency. I did not previously pickup on this part of the history from the patient. 12/18/16; the patient's wound is completely epithelialized. There is no open area here. She has significant bilateral venous insufficiency with secondary lymphedema to a mild-to-moderate degree she does not have compression stockings.. She did not say anything to me when I was in the room, she told our intake nurse that she was still having pain in this area. This isn't unusual recurrent small open area. She is going to go to elastic therapy to obtain compression stockings. 12/25/16; the patient's wound is fully epithelialized. There is no open area here. The patient describes some continued episodic discomfort in this area medial left calf. However everything looks fine and healed here. She is been to elastic therapy and caught herself 15-20 mmHg stockings, they apparently were having trouble getting 20-30 mm stockings in her size 01/22/17; this is a patient we discharged from the clinic a month ago. She has a recurrent open wound on her medial left calf. She had 15 mm support stockings. I told her I thought she needed 20-30 mm compression stockings. She tells me that she has been ill with hospitalization secondary to asthma and is been found to have severe hypokalemia likely secondary to a combination of Lasix and metolazone. This morning she noted blistering and leaking fluid on the posterior part of her left leg. She called our intake nurse urgently and we was saw her this afternoon. She has not had any real discomfort here. I don't know that  she's been wearing any stockings on this leg for at least 2-3 days. ABIs in this clinic were 1.21 on the right and 1.3 on the left. She is previously seen vascular surgery who does not think that there is a peripheral arterial issue. 01/30/17; Patient arrives with no open wound on the left leg. She has been to elastic therapy and obtained 20-48mmhg below knee stockings and she has one on the right leg today. READMISSION 02/19/18; this Meikle is a now 74 year old patient we've had in this clinic perhaps 3 times before. I had last looked at her from January 07 December 2016 with an area on the medial left leg. We discharged her on 12/25/16 however she had to be readmitted on 01/22/17 with a recurrence. I have in my notes that we discharged her on 20-30 mm stockings although she tells me she was only wearing support hose because she cannot get stockings on [name], [name]. ([id number]) predominantly related to her cervical spine surgery/issues. She has had previous ablations done by vein and vascular in [name] including a great saphenous vein ablation on the left with an anterior accessory branch ablation I think both of these were in 2016. On one of the previous visit she had a biopsy noted 2009 that was negative. She is not felt to have an arterial issue. She is not  a diabetic. She does have a history of obstructive sleep apnea hypertension asthma as well as chronic venous insufficiency and lymphedema. On this occasion she noted 2 dry scaly patch on her left leg. She tried to put lotion on this it didn't really help. There were 2 open areas.the patient has been seeing her primary physician from 02/05/18 through 02/14/18. She had Unna boots applied. The superior wound now on the lateral left leg has closed but she's had one wound that remains open on the lateral left leg. This is not the same spot as we dealt with in 2018. ABIs in this clinic were 1.3 bilaterally 02/26/18; patient has a small wound on the  left lateral calf. Dimensions are down. She has chronic venous insufficiency and lymphedema. 03/05/18; small open area on the left lateral calf. Dimensions are down. Tightly adherent necrotic debris over the surface of the wound which was difficult to remove. Also the dressing [over collagen] stuck to the wound surface. This was removed with some difficulty as well. Change the primary dressing to Hydrofera Blue ready 03/12/18; small open area on the left lateral calf. Comes in with tightly adherent surface eschar as well as some adherent Hydrofera Blue. 03/19/18; open area on the left lateral calf. Again adherent surface eschar as well as some adherent Hydrofera Blue nonviable subcutaneous tissue. She complained of pain all week even with the reduction from 4-3 layer compression I put on last week. Also she had an increase in her ankle and calf measurements probably related to the same thing. 03/26/18; open area on the left lateral calf. A very small open area remains here. We used silver alginate starting last week as the Hydrofera Blue seem to stick to the wound bed. In using 4-layer compression 04/02/18; the open area in the left lateral calf at some adherent slough which I removed there is no open area here. We are able to transition her into her own compression stocking. Truthfully I think this is probably his support hose. However this does not maintain skin integrity will be limited. She cannot put over the toe compression stockings on because of neck problems hand problems etc. She is allergic to the lining layer of juxta lites. We might be forced to use extremitease stocking should this fail READMIT 11/24/2018 Patient is now a 74 year old woman who is not a diabetic. She has been in this clinic on at least 3 previous occasions largely with recurrent wounds on her left leg secondary to chronic venous insufficiency with secondary lymphedema. Her situation is complicated by inability to get stockings  on and an allergy to neoprene which is apparently a component and at least juxta lites and other stockings. As a result she really has not been wearing any stockings on her legs. She tells [place] that roughly 2 or 3 weeks ago she started noticing a stinging sensation just above her ankle on the left medial aspect. She has been diagnosed with pseudogout and she wondered whether this was what she was experiencing. She tried to dress this with something she bought at the store however subsequently it pulled skin off and now she has an open wound that is not improving. She has been using Vaseline gauze with a cover bandage. She saw her primary doctor last week who put an Haematologist on her. ABIs in this clinic was 1.03 on the left 2/12; the area is on the left medial ankle. Odd-looking wound with what looks to be surface epithelialization but a multitude of  small petechial openings. This clearly not closed yet. We have been using silver alginate under 3 layer compression with TCA 2/19; the wound area did not look quite as good this week. Necrotic debris over the majority of the wound surface which required debridement. She continues to have a multitude of what looked to be small petechial openings. She reminds [place] that she had a biopsy on this initially during her first outbreak in 2015 in [facility]. She expresses concern about this being a possible melanoma. 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 I assured her that this does not look anything like melanoma. Except for the petechial reaction it does look like a venous insufficiency area and she certainly has evidence of this on both sides 2/26; a difficult area on the left medial ankle. The patient clearly has chronic venous hypertension with some degree of lymphedema. The odd thing about the area is the small petechial hemorrhages. I am not really sure how  to explain this. This was present last time and this is not a compression injury. We have been using Hydrofera Blue which I changed to last week 3/4; still using Hydrofera Blue. Aggressive debridement today. She does not have known arterial issues. She has seen Dr. Kellie Simmering at [facility] vein and vascular and and has an ablation on the left. [Anterior accessory branch of the greater saphenous]. From what I remember they did not feel she had an arterial issue. The patient has had this area biopsied in 2009 at [facility] dermatology and by her recollection they said this was "stasis". She is also follow-up with dermatology locally who thought that this was more of a vascular issue 3/11; using Hydrofera Blue. Aggressive debridement today. She does not have an arterial issue. We are using 3 layer compression although we may need to go to 4. The patient has been in for multiple changes to her wrap since I last saw her a week ago. She says that the area was leaking. I do not have too much more information on what was found 01/19/19 on evaluation today patient was actually being seen for a nurse visit when unfortunately she had the area on her left lateral lower extremity as well as weeping from the right lower extremity that became apparent. Therefore we did end up actually seeing her for a full visit with myself. She is having some pain at this site as well but fortunately nothing too significant at this point. No fevers, chills, nausea, or vomiting noted at this time. 3/18-Patient is back to the clinic with the left leg venous leg ulcer, the ulcer is larger in size, has a surface that is densely adherent with fibrinous tissue, the Hydrofera Blue was used but is densely adherent and there was difficulty in removing it. The right lower extremity was also wrapped for weeping edema. Patient has a new area over the left lateral foot above the malleolus that is small and appears to have no debris with intact  surrounding skin. Patient is on increased dose of Lasix also as a means to edema management 3/25; the patient has a nonhealing venous ulcer on the medial left leg and last week developed a smaller area on the lateral left calf. We have been using Hydrofera Blue with a contact layer. 4/1; no major change in these wounds areas. Left medial and more recently left lateral calf. I tried Iodoflex last week to aid in debridement  she did not tolerate this. She stated her pain was terrible all week. She took the top layer of the 4 layer compression off. 4/8; the patient actually looks somewhat better in terms of her more prominent left lateral calf wound. There is some healthy looking tissue here. She is still complaining of a lot of discomfort. 4/15; patient in a lot of pain secondary to sciatica. She is on a prednisone taper prescribed by her primary physician. She has the 2 areas one on the [place], [name]. ([id number]) left medial and more recently a smaller area on the left lateral calf. Both of these just above the malleoli 4/22; her back pain is better but she still states she is very uncomfortable and now feels she is intolerant to the [employer]. No real change in the wounds we have been using Sorbact. She has been previously intolerant to Iodoflex. There is not a lot of option about what we can use to debride this wound under compression that she no doubt needs. sHe states Ultram no longer works for her pain 4/29; no major change in the wounds slightly increased depth. Surface on the original medial wound perhaps somewhat improved however the more recent area on the lateral left ankle is 100% covered in very adherent debris we have been using Sorbact. She tolerates 4 layer compression well and her edema control is a lot better. She has not had to come in for a nurse check 5/6; no major change in the condition of the wounds. She did consent to debridement today which was done with some difficulty.  Continuing Sorbact. She did not tolerate Iodoflex. She was in for a check of her compression the day after we wrapped her last week this was adjusted but nothing much was found 5/13; no major change in the condition or area of the wounds. I was able to get a fairly aggressive debridement done on the lateral left leg wound. Even using Sorbact under compression. She came back in on Friday to have the wrap changed. She says she felt uncomfortable on the lateral aspect of her ankle. She has a long history of chronic venous insufficiency including previous ablation surgery on this side. 5/20-Patient returns for wounds on left leg with both wounds covered in slough, with the lateral leg wound larger in size, she has been in 3 layer compression and felt more comfortable, she describes pain in ankle, in leg and pins and needles in foot, and is about to try Pamelor for this 6/3; wounds on the left lateral and left medial leg. The area medially which is the most recent of the 2 seems to have had the largest increase in dimensions. We have been using Sorbac to try and debride the surface. She has been to see orthopedics they apparently did a plain x-ray that was indeterminant. Diagnosed her with neuropathy and they have ordered an MRI to determine if there is underlying osteomyelitis. This was not high on my thought list but I suppose it is prudent. We have advised her to make an appointment with vein and vascular in [place]. She has a history of a left greater saphenous and accessory vein ablations I wonder if there is anything else that can be done from a surgical point of view to help in these difficult refractory wounds. We have previously healed this wound on one occasion but it keeps on reopening [medial side] 6/10; deep tissue culture I did last week I think on the left medial wound showed  both moderate E. coli and moderate staph aureus [MSSA]. She is going to require antibiotics and I have chosen  Augmentin. We have been using Sorbact and we have made better looking wound surface on both sides but certainly no improvement in wound area. She was back in last Friday apparently for a dressing changes the wrap was hurting her outer left ankle. She has not managed to get a hold of vein and vascular in [place]. We are going to have to make her that appointment 6/17; patient is tolerating the Augmentin. She had an MRI that I think was ordered by orthopedic surgeon this did not show osteomyelitis or an abscess did suggest cellulitis. We have been using Sorbact to the lateral and medial ankles. We have been trying to arrange a follow-up appointment with vein and vascular in [place] or did her original ablations. We apparently an area sent the request to vein and vascular in [facility] 6/24; patient has completed the Augmentin. We do not yet have a vein and vascular appointment in [place]. I am not sure what the issue is here we have asked her to call tomorrow. We are using Sorbact. Making some improvements and especially the medial wound. Both surfaces however look better medial and lateral. 7/1; the patient has been in contact with vein and vascular in [place] but has not yet received an appointment. Using Sorbact we have gradually improve the wound surface with no improvement in surface area. She is approved for Apligraf but the wound surface still is not completely viable. She has not had to come in for a dressing change 7/8; the patient has an appointment with vein and vascular on 7/31 which is a Friday afternoon. She is concerned about getting back here for [place] to dress her wounds. I think it is important to have them goal for her venous reflux/history of ablations etc. to see if anything else can be done. She apparently tested positive for 1 of the blood tests with regards to lupus and saw a rheumatologist. He has raised the issue of vasculitis again. I have had this thought in the  past however the evidence seems overwhelming that this is a venous reflux etiology. If the rheumatologist tells me there is clinical and laboratory investigation is positive for lupus I will rethink this. 7/15; the patient's wound surfaces are quite a bit better. The medial area which was her original wound now has no depth although the lateral wound which was the more recent area actually appears larger. Both with viable surfaces which is indeed better. Using Sorbact. I wanted to use Apligraf on her however there is the issue of the vein and vascular appointment on 7/31 at 2:00 in the afternoon which would not allow her to get back to be rewrapped and they would no doubt remove the graft 7/22; the patient's wound surfaces have moderate amount of debris although generally look better. The lateral one is larger with 2 small satellite areas superiorly. We are waiting for her vein and vascular appointment on 7/31. She has been approved for Apligraf which I would like to use after th 7/29; wound surfaces have improved no debridement is required we have been using Sorbact. She sees vein and vascular on Friday with this so question of whether anything can be done to lessen the likelihood of recurrence and/or speed the healing of these areas. She is already had previous ablations. She no doubt has severe venous hypertension 8/5-Patient returns at 1 week, she was in [employer]  for 3 days by her podiatrist, we have been using so backed to the wound, she has increased pain in both the wounds on the left lower leg especially the more distal one on the lateral aspect 8/12-Patient returns at 1 week and she is agreeable to having debridement in both wounds on her left leg today. We have been using Sorbact, and vascular studies were reviewed at last visit 8/19; the patient arrives with her wounds fairly clean and no debridement is required. We have used Sorbact which is really done a nice job in cleaning up these  very difficult wound surfaces. The patient saw Dr. Donzetta Matters of vascular surgery on 7/31. He did not feel that there was an arterial component. He felt that her treated greater saphenous vein is adequately addressed and that the small saphenous vein did not appear to be involved significantly. She was also noted to have deep venous reflux which is not treatable. Dr. Donzetta Matters mentioned the possibility of a central obstructive component leading to reflux and he offered her central venography. She wanted to discuss this or think about it. I have urged her to go ahead with this. She has had recurrent difficult wounds in these areas which do heal but after months in the clinic. If there is anything that can be done to reduce the likelihood of this I think it is worth it. 9/2 she is still working towards getting follow-up with Dr. Donzetta Matters to schedule her CT. Things are quite a bit worse venography. I put Apligraf on 2 weeks ago on both wounds on the medial and lateral part of her left lower leg. She arrives in clinic today with 3 superficial additional wounds above the area laterally and one below the wound medially. She describes a lot of discomfort. I think these are probably wrapped injuries. Does not look like she has cellulitis. 07/20/2019 on evaluation today patient appears to be doing somewhat poorly in regard to her lower extremity ulcers. She in fact showed signs of erythema in fact we may even be dealing with an infection at this time. Unfortunately I am unsure if this is just infection or if indeed there may be some allergic reaction that occurred as a result of the Apligraf application. With that being said that would be unusual but nonetheless not impossible in this patient is one who is unfortunately allergic to quite a bit. Currently we have been using the Sorbact which seems to do as well as anything for her. I do think we may want to obtain a culture today to see if there is anything showing up there  that may need to be addressed. [name], [name] 9/16; noted that last week the wounds look worse in 1 week follow-up of the Apligraf. Using Sorbact as of 2 days ago. She arrives with copious amounts of drainage and new skin breakdown on the back of the left calf. The wounds arm more substantial bilaterally. There is a fair amount of swelling in the left calf no overt DVT there is edema present I think in the left greater than right thigh. She is supposed to go on 9/28 for CT venography. The wounds on the medial and lateral calf are worse and she has new skin breakdown posteriorly at least new for me. This is almost developing into a circumferential wound area The Apligraf was taken off last week which I agree with things are not going in the right direction a culture was done we do not have that  back yet. She is on Augmentin that she started 2 days ago 9/23; dressing was changed by her nurses on Monday. In general there is no improvement in the wound areas although the area looks less angry than last week. She did get Augmentin for MSSA cultured on the 14th. She still appears to have too much swelling in the left leg even with 3 layer compression 9/30; the patient underwent her procedure on 9/28 by Dr. Donzetta Matters at [facility]. She was discovered to have the common iliac vein measuring 12.2 mm but at the level of L4-L5 measured 3 mm. After stenting it measured 10 mm. 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 Rouleaux flow in the common femoral and femoral vein was observed much improved after stenting. We are using silver alginate to the wounds on the medial and lateral ankle on the left. 4 layer compression 10/7; the patient had fluid swelling around her knee and 4 layer compression. At the advice of vein and vascular this was reduced to 3 layer which she is tolerating better. We have been using silver alginate under 3 layer compression since last  Friday 10/14; arrives with the areas on the left ankle looking a lot better. Inflammation in the area also a lot better. She came in for a nurse check on 10/9 10/21; continued nice improvement. Slight improvements in surface area of both the medial and lateral wounds on the left. A lot of the satellite lesions in the weeping erythema around these from stasis dermatitis is resolved. We have been using silver alginate 10/28; general improvement in the entire wound areas although not a lot of change in dimensions the wound certainly looks better. There is a lot less in terms of venous inflammation. Continue silver alginate this week however look towards Hydrofera Blue next week 11/4; very adherent debris on the medial wound left wound is not as bad. We have been using silver alginate. Change to [facility] today 11/11; very adherent debris on both wound areas. She went to vein and vascular last week and follow-up they put in [place] boot on this today. He says the [name] was adherent. Wound is definitely not as good as last week. Especially on the left there the satellite lesions look more prominent 11/18; absolutely no better. erythema on lateral aspect with tenderness. 09/30/2019 on evaluation today patient appears to actually be doing better. Dr. Dellia Nims did put her on doxycycline last week which I do believe has helped her at this point. Fortunately there is no signs of active infection at this time. No fevers, chills, nausea, vomiting, or diarrhea. I do believe he may want extend the doxycycline for 7 additional days just to ensure everything does completely cleared up the patient is in agreement with that plan. Otherwise she is going require some sharp debridement today 12/2; patient is completing a 2-week course of doxycycline. I gave her this empirically for inflammation as well as infection when I last saw her 2 weeks ago. All of this seems to be better. She is using silver alginate she  has the area on the medial aspect of the larger area laterally and the 2 small satellite regions laterally above the major wound. 12/9; the patient's wound on the left medial and left lateral calf look really quite good. We have been using silver alginate. She saw vein and vascular in follow-up on 10/09/2019. She has had a previous left greater saphenous vein ablation by Dr. Oscar La in 2016.  More recently she underwent a left common iliac vein stent by Dr. 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 The swelling is improved and certainly the wounds have improved. The patient shows [place] today area on the right medial calf there is almost no wound but leaking lymphedema. She says she start this started 3 or 4 days ago. She did not traumatize it. It is not painful. She does not wear compression on that side 12/16; the patient continues to do well laterally. Medially still requiring debridement. The area on the right calf did not materialize to anything and is not currently open. We wrapped this last time. She has support stockings for that leg although I am not sure they are going to provide adequate compression 12/23; the lateral wound looks stable. Medially still requiring debridement for tightly adherent fibrinous debris. We've been using silver alginate. Surface area not any different 12/30; neither wound is any better with regards to surface and the area on the left lateral is larger. I been using silver alginate to the left lateral which look quite good last week and Sorbact to the left medial 11/11/2019. Lateral wound area actually looks better and somewhat smaller. Medial still requires a very aggressive debridement today. We have been using Sorbact on both wound areas 1/13; not much better still adherent debris bilaterally. I been using Sorbact. She has severe venous hypertension. Probably some degree of dermal fibrosis distally. I wonder whether tighter compression might help and I am going  to try that today. We also need to work on the bioburden 1/20; using Sorbact. She has severe venous hypertension status post stent placement for pelvic vein compression. We applied gentamicin last time to see if we could reduce bioburden I had some discussion with her today about the use of pentoxifylline. This is occasionally used in this setting for wounds with refractory venous insufficiency. However this interacts with Plavix. She tells me that she was put on this after stent placement for 3 months. She will call Dr. Claretha Cooper office to discuss 1/27; we are using gentamicin under Sorbact. 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 She has a stent. Wound medially is measuring smaller this week. Laterally measuring slightly larger although she has some satellite lesions superiorly 2/3; gentamicin under Sorbact under 4-layer compression. 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 She has a stent on Plavix. Her wounds are measuring smaller this week. More substantially laterally where there is a satellite lesion superiorly. 2/10; gentamicin under Sorbac. 4-layer compression. Patient communicated with Dr. Donzetta Matters at vein and vascular in [place]. He is okay with the patient coming off Plavix I will therefore start her on pentoxifylline for a 1 month trial. In general her wounds look better today. I had some concerns about swelling in the left thigh however she measures 61.5 on the right and 63 on the mid thigh which does not suggest there is any difficulty. The patient is not describing any pain. 2/17; gentamicin under Sorbac 4-layer compression. She has been on pentoxifylline for 1 week and complains of loose stool. No nausea she is eating and drinking well 2/24; the patient apparently came in 2 days ago for a nurse visit when her wrap fell down. Both areas look a little worse this week macerated medially and satellite lesions laterally. Change to  silver alginate today [name], [name] Electronic Signature(s) Signed: 12/30/2019 5:01:35 PM By: Linton Ham MD Entered By: Linton Ham on 12/30/2019 13:31:23 Hipps, Mikeya J. (  [id number]) -------------------------------------------------------------------------------- Physical Exam Details Patient Name: KAMAURIA, WEISINGER. Date of Service: 12/30/2019 12:45 PM Medical Record Number: [id number] Patient Account Number: [id number] Date of Birth/Sex: 09-27-1946 (74 y.o. F) Treating RN: Cornell Barman Primary Care Provider: [name] Other Clinician: Referring Provider: [name] Treating Provider/Extender: Tito Dine in Treatment: 55 Constitutional Sitting or standing Blood Pressure is within target range for patient.. Pulse regular and within target range for patient.Marland Kitchen Respirations regular, non- labored and within target range.. Temperature is normal and within the target range for the patient.Marland Kitchen appears in no distress. Cardiovascular Pedal pulses are palpable.. Edema control is good. Notes Wound exam; both areas on the medial and lateral left lower leg looks somewhat irritated and worse today. Medially there is some denuded epithelial loss and maceration on the lateral side surface looks wet there are some satellite lesions. No debridement was done. No clear evidence of infection Electronic Signature(s) Signed: 12/30/2019 5:01:35 PM By: Linton Ham MD Entered By: Linton Ham on 12/30/2019 13:32:35 [name], [name] -------------------------------------------------------------------------------- Physician Orders Details Patient Name: BRITTNEI, STELLAR. Date of Service: 12/30/2019 12:45 PM Medical Record Number: [id number] Patient Account Number: [id number] Date of Birth/Sex: [birth year] (74 y.o. F) Treating RN: Army Melia Primary Care Provider: [name] Other Clinician: Referring Provider: [name] Treating  Provider/Extender: Tito Dine in Treatment: 23 Verbal / Phone Orders: No Diagnosis Coding Wound Cleansing Wound #5 Left,Medial Lower Leg o Cleanse wound with mild soap and water Wound #6 Left,Lateral Lower Leg o Cleanse wound with mild soap and water Wound #9 Left,Lateral,Posterior Lower Leg o Cleanse wound with mild soap and water Anesthetic (add to Medication List) Wound #5 Left,Medial Lower Leg o Topical Lidocaine 4% cream applied to wound bed prior to debridement (In Clinic Only). Wound #6 Left,Lateral Lower Leg o Topical Lidocaine 4% cream applied to wound bed prior to debridement (In Clinic Only). Wound #9 Left,Lateral,Posterior Lower Leg o Topical Lidocaine 4% cream applied to wound bed prior to debridement (In Clinic Only). Primary Wound Dressing Wound #5 Left,Medial Lower Leg o Silver Alginate Wound #6 Left,Lateral Lower Leg o Silver Alginate Wound #9 Left,Lateral,Posterior Lower Leg o Silver Alginate Secondary Dressing Wound #5 Left,Medial Lower Leg o Drawtex Wound #6 Left,Lateral Lower Leg o Drawtex Wound #9 Left,Lateral,Posterior Lower Leg o Drawtex Dressing Change Frequency Wound #5 Left,Medial Lower Leg o Change dressing every week o Other: - as needed. Wound #6 Left,Lateral Lower Leg o Change dressing every week o Other: - as needed. Wound #9 Left,Lateral,Posterior Lower Leg o Change dressing every week o Other: - as needed. NORAH, GROS ([id number]) Follow-up Appointments Wound #5 Left,Medial Lower Leg o Return Appointment in 1 week. o Nurse Visit as needed - Call of needed Wound #6 Left,Lateral Lower Leg o Return Appointment in 1 week. o Nurse Visit as needed - Call of needed Wound #9 Left,Lateral,Posterior Lower Leg o Return Appointment in 1 week. o Nurse Visit as needed - Call of needed Edema Control Wound #5 Left,Medial Lower Leg o 4-Layer Compression System - Left Lower  Extremity. o Elevate legs to the level of the heart and pump ankles as often as possible Wound #6 Left,Lateral Lower Leg o 4-Layer Compression System - Left Lower Extremity. o Elevate legs to the level of the heart and pump ankles as often as possible Wound #9 Left,Lateral,Posterior Lower Leg o 4-Layer Compression System - Left Lower Extremity. o Elevate legs to the level of the heart and pump ankles as often as possible Medications-please  add to medication list. Wound #5 Left,Medial Lower Leg o Other: - Pentoxifylline Electronic Signature(s) Signed: 12/30/2019 4:52:55 PM By: Army Melia Signed: 12/30/2019 5:01:35 PM By: Linton Ham MD Entered By: Army Melia on 12/30/2019 13:16:20 OTHELLA, KANGAS ([id number]) -------------------------------------------------------------------------------- Problem List Details Patient Name: [name], [name]. Date of Service: 12/30/2019 12:45 PM Medical Record Number: [id number] Patient Account Number: [id number] Date of Birth/Sex: [birth year] (74 y.o. 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 Date of Service: 12/30/2019 12:45 PM Medical Record Number: [id number] Patient Account Number: [id number] Date of Birth/Sex: 1946-02-16 (74 y.o. F) Treating RN: Cornell Barman Primary Care Provider: [name] Other Clinician: Referring Provider: [name] Treating Provider/Extender: Tito Dine in Treatment: 74 Subjective History of Present Illness (HPI) Pleasant 74 year old with history of chronic venous insufficiency. No diabetes or peripheral vascular disease. Left ABI 1.29. Questionable history of left lower extremity DVT. She developed a recurrent ulceration on her left lateral calf in December 2015, which she attributes to poor diet and subsequent lower extremity edema. She underwent endovenous laser ablation of her left greater saphenous vein in 2010. She underwent laser ablation of accessory branch of left GSV in April 2016 by Dr. Kellie Simmering at [facility]. She was previously wearing Unna boots, which she tolerated well. Tolerating 2 layer compression and cadexomer iodine. She returns to clinic for follow-up and is without new complaints. She denies any significant pain at this time. She reports persistent pain with pressure. No claudication or ischemic rest pain. No fever or chills. No drainage. READMISSION 11/13/16; this is a 74 year old woman who is not a diabetic. She is here for a review of a painful area on her left medial lower extremity. I note that she was seen here previously last year for wound I believe to be in the same area. At that time she had undergone previously a left greater saphenous vein ablation by Dr. Kellie Simmering and she had a ablation of the anterior accessory branch of the left greater saphenous  vein in March 2016. Seeing that the wound actually closed over. In reviewing the history with her today the ulcer in this area has been recurrent. She describes a biopsy of this area in 2009 that only showed stasis physiology. 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 Do not see that she has ever had arterial studies in the left leg. She tells me she has a follow-up with Dr. Kellie Simmering in roughly 10  days In any case she developed the reopening of this area roughly a month ago. On the background of this she describes rapidly increasing edema which has responded to Lasix 40 mg and metolazone 2.5 mg as well as the patient's lymph massage. She has been told she has both venous insufficiency and lymphedema but she cannot tolerate compression stockings 11/28/16; the patient saw Dr. Kellie Simmering recently. Per the patient he did arterial Dopplers in the office that did not show evidence of arterial insufficiency, per the patient he stated "treat this like an ordinary venous ulcer". She also saw her dermatologist Dr. Ronnald Ramp who felt that this was more of a vascular ulcer. In general things are improving although she arrives today with increasing bilateral lower extremity edema with weeping a deeper fluid through the wound on the left medial leg compatible with some degree of lymphedema 12/04/16; the patient's wound is fully epithelialized but I don't think fully healed. We will do another week of depression with Promogran and TCA however I suspect we'll be able to discharge her next week. This is a very unusual-looking wound which was initially a figure-of-eight type wound lying on its side surrounded by petechial like hemorrhage. She has had venous ablation on this side. She apparently does not have an  arterial issue per Dr. Kellie Simmering. She saw her dermatologist thought it was "vascular". Patient is definitely going to need ongoing compression and I talked about this with her today she will go to elastic therapy after she leaves here next week 12/11/16; the patient's wound is not completely closed today. She has surrounding scar tissue and in further discussion with the patient it would appear that she had ulcers in this area in 2009 for a prolonged period of time ultimately requiring a punch biopsy of this area that only showed venous insufficiency. I did not previously pickup on this part of the history from the patient. 12/18/16; the patient's wound is completely epithelialized. There is no open area here. She has significant bilateral venous insufficiency with secondary lymphedema to a mild-to-moderate degree she does not have compression stockings.. She did not say anything to me when I was in the room, she told our intake nurse that she was still having pain in this area. This isn't unusual recurrent small open area. She is going to go to elastic therapy to obtain compression stockings. 12/25/16; the patient's wound is fully epithelialized. There is no open area here. The patient describes some continued episodic discomfort in this area medial left calf. However everything looks fine and healed here. She is been to elastic therapy and caught herself 15-20 mmHg stockings, they apparently were having trouble getting 20-30 mm stockings in her size 01/22/17; this is a patient we discharged from the clinic a month ago. She has a recurrent open wound on her medial left calf. She had 15 mm support stockings. I told her I thought she needed 20-30 mm compression stockings. She tells me that she has been ill with hospitalization secondary to asthma and is been found to have severe hypokalemia likely secondary to a combination of Lasix and metolazone. This morning she noted blistering and leaking fluid on the  posterior part of her left leg. She called our intake nurse urgently and we was saw her this afternoon. She has not had any real discomfort here. I don't know that she's been wearing any stockings on this leg for at least 2-3 days. ABIs in this clinic were 1.21 on the right and 1.3 on  the left. She is previously seen vascular surgery who does not think that there is a peripheral arterial issue. 01/30/17; Patient arrives with no open wound on the left leg. She has been to elastic therapy and obtained 20-17mmhg below knee stockings and she has one on the right leg today. READMISSION 02/19/18; this Barkdoll is a now 74 year old patient we've had in this clinic perhaps 3 times before. I had last looked at her from January 07 December 2016 with an area on the medial left leg. We discharged her on 12/25/16 however she had to be readmitted on 01/22/17 with a recurrence. I have in my notes that we discharged her on 20-30 mm stockings although she tells me she was only wearing support hose because she cannot get stockings on predominantly related to her cervical spine surgery/issues. She has had previous ablations done by vein and vascular in [place] including a great saphenous vein ablation on the left with an anterior accessory branch ablation I think both of these were in 2016. On one of the previous visit she had a biopsy noted 2009 that was negative. She is not felt to have an arterial issue. She is not a diabetic. She does have a history of obstructive sleep apnea hypertension asthma as well as chronic venous insufficiency and lymphedema. [name], [name] On this occasion she noted 2 dry scaly patch on her left leg. She tried to put lotion on this it didn't really help. There were 2 open areas.the patient has been seeing her primary physician from 02/05/18 through 02/14/18. She had Unna boots applied. The superior wound now on the lateral left leg has closed but she's had one wound that remains  open on the lateral left leg. This is not the same spot as we dealt with in 2018. ABIs in this clinic were 1.3 bilaterally 02/26/18; patient has a small wound on the left lateral calf. Dimensions are down. She has chronic venous insufficiency and lymphedema. 03/05/18; small open area on the left lateral calf. Dimensions are down. Tightly adherent necrotic debris over the surface of the wound which was difficult to remove. Also the dressing [over collagen] stuck to the wound surface. This was removed with some difficulty as well. Change the primary dressing to Hydrofera Blue ready 03/12/18; small open area on the left lateral calf. Comes in with tightly adherent surface eschar as well as some adherent Hydrofera Blue. 03/19/18; open area on the left lateral calf. Again adherent surface eschar as well as some adherent Hydrofera Blue nonviable subcutaneous tissue. She complained of pain all week even with the reduction from 4-3 layer compression I put on last week. Also she had an increase in her ankle and calf measurements probably related to the same thing. 03/26/18; open area on the left lateral calf. A very small open area remains here. We used silver alginate starting last week as the Hydrofera Blue seem to stick to the wound bed. In using 4-layer compression 04/02/18; the open area in the left lateral calf at some adherent slough which I removed there is no open area here. We are able to transition her into her own compression stocking. Truthfully I think this is probably his support hose. However this does not maintain skin integrity will be limited. She cannot put over the toe compression stockings on because of neck problems hand problems etc. She is allergic to the lining layer of juxta lites. We might be forced to use extremitease stocking should this fail READMIT 11/24/2018 Patient  is now a 74 year old woman who is not a diabetic. She has been in this clinic on at least 3 previous occasions largely  with recurrent wounds on her left leg secondary to chronic venous insufficiency with secondary lymphedema. Her situation is complicated by inability to get stockings on and an allergy to neoprene which is apparently a component and at least juxta lites and other stockings. As a result she really has not been wearing any stockings on her legs. She tells [place] that roughly 2 or 3 weeks ago she started noticing a stinging sensation just above her ankle on the left medial aspect. She has been diagnosed with pseudogout and she wondered whether this was what she was experiencing. She tried to dress this with something she bought at the store however subsequently it pulled skin off and now she has an open wound that is not improving. She has been using Vaseline gauze with a cover bandage. She saw her primary doctor last week who put an Haematologist on her. ABIs in this clinic was 1.03 on the left 2/12; the area is on the left medial ankle. Odd-looking wound with what looks to be surface epithelialization but a multitude of small petechial openings. This clearly not closed yet. We have been using silver alginate under 3 layer compression with TCA 2/19; the wound area did not look quite as good this week. Necrotic debris over the majority of the wound surface which required debridement. She continues to have a multitude of what looked to be small petechial openings. She reminds [place] that she had a biopsy on this initially during her first outbreak in 2015 in [place] dermatology. She expresses concern about this being a possible melanoma. 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 I assured her that this does not look anything like melanoma. Except for the petechial reaction it does look like a venous insufficiency area and she certainly has evidence of this on both sides 2/26; a difficult area on the left medial ankle. The  patient clearly has chronic venous hypertension with some degree of lymphedema. The odd thing about the area is the small petechial hemorrhages. I am not really sure how to explain this. This was present last time and this is not a compression injury. We have been using Hydrofera Blue which I changed to last week 3/4; still using Hydrofera Blue. Aggressive debridement today. She does not have known arterial issues. She has seen Dr. Kellie Simmering at [facility] vein and vascular and and has an ablation on the left. [Anterior accessory branch of the greater saphenous]. From what I remember they did not feel she had an arterial issue. The patient has had this area biopsied in 2009 at [facility] dermatology and by her recollection they said this was "stasis". She is also follow-up with dermatology locally who thought that this was more of a vascular issue 3/11; using Hydrofera Blue. Aggressive debridement today. She does not have an arterial issue. We are using 3 layer compression although we may need to go to 4. The patient has been in for multiple changes to her wrap since I last saw her a week ago. She says that the area was leaking. I do not have too much more information on what was found 01/19/19 on evaluation today patient was actually being seen for a nurse visit when unfortunately she had the area on her left lateral lower extremity as well as weeping  from the right lower extremity that became apparent. Therefore we did end up actually seeing her for a full visit with myself. She is having some pain at this site as well but fortunately nothing too significant at this point. No fevers, chills, nausea, or vomiting noted at this time. 3/18-Patient is back to the clinic with the left leg venous leg ulcer, the ulcer is larger in size, has a surface that is densely adherent with fibrinous tissue, the Hydrofera Blue was used but is densely adherent and there was difficulty in removing it. The right lower extremity  was also wrapped for weeping edema. Patient has a new area over the left lateral foot above the malleolus that is small and appears to have no debris with intact surrounding skin. Patient is on increased dose of Lasix also as a means to edema management 3/25; the patient has a nonhealing venous ulcer on the medial left leg and last week developed a smaller area on the lateral left calf. We have been using Hydrofera Blue with a contact layer. 4/1; no major change in these wounds areas. Left medial and more recently left lateral calf. I tried Iodoflex last week to aid in debridement she did not tolerate this. She stated her pain was terrible all week. She took the top layer of the 4 layer compression off. 4/8; the patient actually looks somewhat better in terms of her more prominent left lateral calf wound. There is some healthy looking tissue here. She is still complaining of a lot of discomfort. 4/15; patient in a lot of pain secondary to sciatica. She is on a prednisone taper prescribed by her primary physician. She has the 2 areas one on the left medial and more recently a smaller area on the left lateral calf. Both of these just above the malleoli 4/22; her back pain is better but she still states she is very uncomfortable and now feels she is intolerant to the [employer]. No real change in the wounds we have been using Sorbact. She has been previously intolerant to Iodoflex. There is not a lot of option about what we can use to debride this wound under compression that she no doubt needs. sHe states Ultram no longer works for her pain [name], [name] 4/29; no major change in the wounds slightly increased depth. Surface on the original medial wound perhaps somewhat improved however the more recent area on the lateral left ankle is 100% covered in very adherent debris we have been using Sorbact. She tolerates 4 layer compression well and her edema control is a lot better. She has not  had to come in for a nurse check 5/6; no major change in the condition of the wounds. She did consent to debridement today which was done with some difficulty. Continuing Sorbact. She did not tolerate Iodoflex. She was in for a check of her compression the day after we wrapped her last week this was adjusted but nothing much was found 5/13; no major change in the condition or area of the wounds. I was able to get a fairly aggressive debridement done on the lateral left leg wound. Even using Sorbact under compression. She came back in on Friday to have the wrap changed. She says she felt uncomfortable on the lateral aspect of her ankle. She has a long history of chronic venous insufficiency including previous ablation surgery on this side. 5/20-Patient returns for wounds on left leg with both wounds covered in slough, with the lateral leg  wound larger in size, she has been in 3 layer compression and felt more comfortable, she describes pain in ankle, in leg and pins and needles in foot, and is about to try Pamelor for this 6/3; wounds on the left lateral and left medial leg. The area medially which is the most recent of the 2 seems to have had the largest increase in dimensions. We have been using Sorbac to try and debride the surface. She has been to see orthopedics they apparently did a plain x-ray that was indeterminant. Diagnosed her with neuropathy and they have ordered an MRI to determine if there is underlying osteomyelitis. This was not high on my thought list but I suppose it is prudent. We have advised her to make an appointment with vein and vascular in [place]. She has a history of a left greater saphenous and accessory vein ablations I wonder if there is anything else that can be done from a surgical point of view to help in these difficult refractory wounds. We have previously healed this wound on one occasion but it keeps on reopening [medial side] 6/10; deep tissue culture I did last  week I think on the left medial wound showed both moderate E. coli and moderate staph aureus [MSSA]. She is going to require antibiotics and I have chosen Augmentin. We have been using Sorbact and we have made better looking wound surface on both sides but certainly no improvement in wound area. She was back in last Friday apparently for a dressing changes the wrap was hurting her outer left ankle. She has not managed to get a hold of vein and vascular in [place]. We are going to have to make her that appointment 6/17; patient is tolerating the Augmentin. She had an MRI that I think was ordered by orthopedic surgeon this did not show osteomyelitis or an abscess did suggest cellulitis. We have been using Sorbact to the lateral and medial ankles. We have been trying to arrange a follow-up appointment with vein and vascular in [place] or did her original ablations. We apparently an area sent the request to vein and vascular in [facility] 6/24; patient has completed the Augmentin. We do not yet have a vein and vascular appointment in [place]. I am not sure what the issue is here we have asked her to call tomorrow. We are using Sorbact. Making some improvements and especially the medial wound. Both surfaces however look better medial and lateral. 7/1; the patient has been in contact with vein and vascular in [place] but has not yet received an appointment. Using Sorbact we have gradually improve the wound surface with no improvement in surface area. She is approved for Apligraf but the wound surface still is not completely viable. She has not had to come in for a dressing change 7/8; the patient has an appointment with vein and vascular on 7/31 which is a Friday afternoon. She is concerned about getting back here for [place] to dress her wounds. I think it is important to have them goal for her venous reflux/history of ablations etc. to see if anything else can be done. She apparently tested  positive for 1 of the blood tests with regards to lupus and saw a rheumatologist. He has raised the issue of vasculitis again. I have had this thought in the past however the evidence seems overwhelming that this is a venous reflux etiology. If the rheumatologist tells me there is clinical and laboratory investigation is positive for lupus I will rethink this.  7/15; the patient's wound surfaces are quite a bit better. The medial area which was her original wound now has no depth although the lateral wound which was the more recent area actually appears larger. Both with viable surfaces which is indeed better. Using Sorbact. I wanted to use Apligraf on her however there is the issue of the vein and vascular appointment on 7/31 at 2:00 in the afternoon which would not allow her to get back to be rewrapped and they would no doubt remove the graft 7/22; the patient's wound surfaces have moderate amount of debris although generally look better. The lateral one is larger with 2 small satellite areas superiorly. We are waiting for her vein and vascular appointment on 7/31. She has been approved for Apligraf which I would like to use after th 7/29; wound surfaces have improved no debridement is required we have been using Sorbact. She sees vein and vascular on Friday with this so question of whether anything can be done to lessen the likelihood of recurrence and/or speed the healing of these areas. She is already had previous ablations. She no doubt has severe venous hypertension 8/5-Patient returns at 1 week, she was in [place] for 3 days by her podiatrist, we have been using so backed to the wound, she has increased pain in both the wounds on the left lower leg especially the more distal one on the lateral aspect 8/12-Patient returns at 1 week and she is agreeable to having debridement in both wounds on her left leg today. We have been using Sorbact, and vascular studies were reviewed at last visit 8/19;  the patient arrives with her wounds fairly clean and no debridement is required. We have used Sorbact which is really done a nice job in cleaning up these very difficult wound surfaces. The patient saw Dr. Donzetta Matters of vascular surgery on 7/31. He did not feel that there was an arterial component. He felt that her treated greater saphenous vein is adequately addressed and that the small saphenous vein did not appear to be involved significantly. She was also noted to have deep venous reflux which is not treatable. Dr. Donzetta Matters mentioned the possibility of a central obstructive component leading to reflux and he offered her central venography. She wanted to discuss this or think about it. I have urged her to go ahead with this. She has had recurrent difficult wounds in these areas which do heal but after months in the clinic. If there is anything that can be done to reduce the likelihood of this I think it is worth it. 9/2 she is still working towards getting follow-up with Dr. Donzetta Matters to schedule her CT. Things are quite a bit worse venography. I put Apligraf on 2 weeks ago on both wounds on the medial and lateral part of her left lower leg. She arrives in clinic today with 3 superficial additional wounds above the area laterally and one below the wound medially. She describes a lot of discomfort. I think these are probably wrapped injuries. Does not look like she has cellulitis. 07/20/2019 on evaluation today patient appears to be doing somewhat poorly in regard to her lower extremity ulcers. She in fact showed signs of erythema in fact we may even be dealing with an infection at this time. Unfortunately I am unsure if this is just infection or if indeed there may be some allergic reaction that occurred as a result of the Apligraf application. With that being said that would be unusual but nonetheless  not impossible in this patient is one who is unfortunately allergic to quite a bit. Currently we have been using  the Sorbact which seems to do as well as anything for her. I do think we may want to obtain a culture today to see if there is anything showing up there that may need to be addressed. 9/16; noted that last week the wounds look worse in 1 week follow-up of the Apligraf. Using Sorbact as of 2 days ago. She arrives with copious amounts of drainage and new skin breakdown on the back of the left calf. The wounds arm more substantial bilaterally. There is a fair amount of swelling in the left calf no overt DVT there is edema present I think in the left greater than right thigh. She is supposed to go on 9/28 for CT [name], [name]. ([id number]) venography. The wounds on the medial and lateral calf are worse and she has new skin breakdown posteriorly at least new for me. This is almost developing into a circumferential wound area The Apligraf was taken off last week which I agree with things are not going in the right direction a culture was done we do not have that back yet. She is on Augmentin that she started 2 days ago 9/23; dressing was changed by her nurses on Monday. In general there is no improvement in the wound areas although the area looks less angry than last week. She did get Augmentin for MSSA cultured on the 14th. She still appears to have too much swelling in the left leg even with 3 layer compression 9/30; the patient underwent her procedure on 9/28 by Dr. Donzetta Matters at vascular and vein specialist. She was discovered to have the common iliac vein measuring 12.2 mm but at the level of L4-L5 measured 3 mm. After stenting it measured 10 mm. 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 Rouleaux flow in the common femoral and femoral vein was observed much improved after stenting. We are using silver alginate to the wounds on the medial and lateral ankle on the left. 4 layer compression 10/7; the patient had fluid swelling around her knee and 4 layer compression. At the advice of vein  and vascular this was reduced to 3 layer which she is tolerating better. We have been using silver alginate under 3 layer compression since last Friday 10/14; arrives with the areas on the left ankle looking a lot better. Inflammation in the area also a lot better. She came in for a nurse check on 10/9 10/21; continued nice improvement. Slight improvements in surface area of both the medial and lateral wounds on the left. A lot of the satellite lesions in the weeping erythema around these from stasis dermatitis is resolved. We have been using silver alginate 10/28; general improvement in the entire wound areas although not a lot of change in dimensions the wound certainly looks better. There is a lot less in terms of venous inflammation. Continue silver alginate this week however look towards Hydrofera Blue next week 11/4; very adherent debris on the medial wound left wound is not as bad. We have been using silver alginate. Change to [facility] today 11/11; very adherent debris on both wound areas. She went to vein and vascular last week and follow-up they put in [place] boot on this today. He says the [facility] was adherent. Wound is definitely not as good as last week. Especially on the left there the satellite lesions look more prominent 11/18;  absolutely no better. erythema on lateral aspect with tenderness. 09/30/2019 on evaluation today patient appears to actually be doing better. Dr. Dellia Nims did put her on doxycycline last week which I do believe has helped her at this point. Fortunately there is no signs of active infection at this time. No fevers, chills, nausea, vomiting, or diarrhea. I do believe he may want extend the doxycycline for 7 additional days just to ensure everything does completely cleared up the patient is in agreement with that plan. Otherwise she is going require some sharp debridement today 12/2; patient is completing a 2-week course of doxycycline. I gave her this  empirically for inflammation as well as infection when I last saw her 2 weeks ago. All of this seems to be better. She is using silver alginate she has the area on the medial aspect of the larger area laterally and the 2 small satellite regions laterally above the major wound. 12/9; the patient's wound on the left medial and left lateral calf look really quite good. We have been using silver alginate. She saw vein and vascular in follow-up on 10/09/2019. She has had a previous left greater saphenous vein ablation by Dr. Oscar La in 2016. More recently she underwent a left common iliac vein stent by Dr. 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 The swelling is improved and certainly the wounds have improved. The patient shows [place] today area on the right medial calf there is almost no wound but leaking lymphedema. She says she start this started 3 or 4 days ago. She did not traumatize it. It is not painful. She does not wear compression on that side 12/16; the patient continues to do well laterally. Medially still requiring debridement. The area on the right calf did not materialize to anything and is not currently open. We wrapped this last time. She has support stockings for that leg although I am not sure they are going to provide adequate compression 12/23; the lateral wound looks stable. Medially still requiring debridement for tightly adherent fibrinous debris. We've been using silver alginate. Surface area not any different 12/30; neither wound is any better with regards to surface and the area on the left lateral is larger. I been using silver alginate to the left lateral which look quite good last week and Sorbact to the left medial 11/11/2019. Lateral wound area actually looks better and somewhat smaller. Medial still requires a very aggressive debridement today. We have been using Sorbact on both wound areas 1/13; not much better still adherent debris bilaterally. I been using Sorbact.  She has severe venous hypertension. Probably some degree of dermal fibrosis distally. I wonder whether tighter compression might help and I am going to try that today. We also need to work on the bioburden 1/20; using Sorbact. She has severe venous hypertension status post stent placement for pelvic vein compression. We applied gentamicin last time to see if we could reduce bioburden I had some discussion with her today about the use of pentoxifylline. This is occasionally used in this setting for wounds with refractory venous insufficiency. However this interacts with Plavix. She tells me that she was put on this after stent placement for 3 months. She will call Dr. Claretha Cooper office to discuss 1/27; we are using gentamicin under Sorbact. 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 She has a stent. Wound medially is measuring smaller this week. Laterally measuring slightly larger although she has some satellite lesions superiorly 2/3; gentamicin under Sorbact under  4-layer compression. 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 She has a stent on Plavix. Her wounds are measuring smaller this week. More substantially laterally where there is a satellite lesion superiorly. 2/10; gentamicin under Sorbac. 4-layer compression. Patient communicated with Dr. Donzetta Matters at vein and vascular in [place]. He is okay with the patient coming off Plavix I will therefore start her on pentoxifylline for a 1 month trial. In general her wounds look better today. I had some concerns about swelling in the left thigh however she measures 61.5 on the right and 63 on the mid thigh which does not suggest there is any difficulty. The patient is not describing any pain. 2/17; gentamicin under Sorbac 4-layer compression. She has been on pentoxifylline for 1 week and complains of loose stool. No nausea she is eating and drinking well 2/24; the patient apparently came in 2 days ago  for a nurse visit when her wrap fell down. Both areas look a little worse this week macerated medially and satellite lesions laterally. Change to silver alginate today VELENA, MCVEIGH ([id number]) Objective Constitutional Sitting or standing Blood Pressure is within target range for patient.. Pulse regular and within target range for patient.Marland Kitchen Respirations regular, non- labored and within target range.. Temperature is normal and within the target range for the patient.Marland Kitchen appears in no distress. Vitals Time Taken: 12:50 PM, Height: 63 in, Weight: 224.7 lbs, BMI: 39.8, Temperature: 98.4 F, Pulse: 79 bpm, Respiratory Rate: 16 breaths/min, Blood Pressure: 143/79 mmHg. Cardiovascular Pedal pulses are palpable.. Edema control is good. General Notes: Wound exam; both areas on the medial and lateral left lower leg looks somewhat irritated and worse today. Medially there is some denuded epithelial loss and maceration on the lateral side surface looks wet there are some satellite lesions. No debridement was done. No clear evidence of infection Integumentary (Hair, Skin) Wound #5 status is Open. Original cause of wound was Gradually Appeared. The wound is located on the Left,Medial Lower Leg. The wound measures 2.6cm length x 2.4cm width x 0.2cm depth; 4.901cm^2 area and 0.98cm^3 volume. There is Fat Layer (Subcutaneous Tissue) Exposed exposed. There is no tunneling or undermining noted. There is a large amount of serosanguineous drainage noted. The wound margin is indistinct and nonvisible. There is small (1-33%) pale granulation within the wound bed. There is a large (67-100%) amount of necrotic tissue within the wound bed including Adherent Slough. Wound #6 status is Open. Original cause of wound was Gradually Appeared. The wound is located on the Left,Lateral Lower Leg. The wound measures 4.2cm length x 2cm width x 0.2cm depth; 6.597cm^2 area and 1.319cm^3 volume. There is Fat Layer (Subcutaneous  Tissue) Exposed exposed. There is no tunneling or undermining noted. There is a large amount of serosanguineous drainage noted. The wound margin is flat and intact. There is small (1-33%) pink granulation within the wound bed. There is a large (67-100%) amount of necrotic tissue within the wound bed including Adherent Slough. Wound #9 status is Open. Original cause of wound was Gradually Appeared. The wound is located on the Left,Lateral,Posterior Lower Leg. The wound measures 0.6cm length x 0.6cm width x 0.1cm depth; 0.283cm^2 area and 0.028cm^3 volume. There is Fat Layer (Subcutaneous Tissue) Exposed exposed. There is no tunneling or undermining noted. There is a large amount of serosanguineous drainage noted. The wound margin is flat and intact. There is small (1-33%) pink granulation within the wound bed. There is a large (67-100%) amount of necrotic tissue within the wound bed including  Adherent Slough. 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 There was a Four Layer Compression Therapy Procedure by Army Melia, RN. Post procedure Diagnosis Wound #5: Same as Pre-Procedure ASUCENA, VANCUREN. ([id number]) Wound #6 Pre-procedure diagnosis of Wound #6 is a Venous Leg Ulcer located on the Left,Lateral Lower Leg . There was a Four Layer Compression Therapy Procedure by Army Melia, RN. Post procedure Diagnosis Wound #6: Same as Pre-Procedure Wound #9 Pre-procedure diagnosis of Wound #9 is a Venous Leg Ulcer located on the Left,Lateral,Posterior Lower Leg . There was a Four Layer Compression Therapy Procedure by  Army Melia, RN. Post procedure Diagnosis Wound #9: Same as Pre-Procedure Plan Wound Cleansing: Wound #5 Left,Medial Lower Leg: Cleanse wound with mild soap and water Wound #6 Left,Lateral Lower Leg: Cleanse wound with mild soap and water Wound #9 Left,Lateral,Posterior Lower Leg: Cleanse wound with mild soap and water Anesthetic (add to Medication List): Wound #5 Left,Medial Lower Leg: Topical Lidocaine 4% cream applied to wound bed prior to debridement (In Clinic Only). Wound #6 Left,Lateral Lower Leg: Topical Lidocaine 4% cream applied to wound bed prior to debridement (In Clinic Only). Wound #9 Left,Lateral,Posterior Lower Leg: Topical Lidocaine 4% cream applied to wound bed prior to debridement (In Clinic Only). Primary Wound Dressing: Wound #5 Left,Medial Lower Leg: Silver Alginate Wound #6 Left,Lateral Lower Leg: Silver Alginate Wound #9 Left,Lateral,Posterior Lower Leg: Silver Alginate Secondary Dressing: Wound #5 Left,Medial Lower Leg: Drawtex Wound #6 Left,Lateral Lower Leg: Drawtex Wound #9 Left,Lateral,Posterior Lower Leg: Drawtex Dressing Change Frequency: Wound #5 Left,Medial Lower Leg: Change dressing every week Other: - as needed. Wound #6 Left,Lateral Lower Leg: Change dressing every week Other: - as needed. Wound #9 Left,Lateral,Posterior Lower Leg: Change dressing every week Other: - as needed. Follow-up Appointments: Wound #5 Left,Medial Lower Leg: Return Appointment in 1 week. Nurse Visit as needed - Call of needed Wound #6 Left,Lateral Lower Leg: Return Appointment in 1 week. Nurse Visit as needed - Call of needed Wound #9 Left,Lateral,Posterior Lower Leg: Return Appointment in 1 week. Nurse Visit as needed - Call of needed Edema Control: Wound #5 Left,Medial Lower Leg: 4-Layer Compression System - Left Lower Extremity. Elevate legs to the level of the heart and pump ankles as often as possible Wound #6 Left,Lateral Lower Leg: VERSIE, GETMAN ([id number]) 4-Layer Compression System - Left Lower Extremity. Elevate legs to the level of the heart and pump ankles as often as possible Wound #9 Left,Lateral,Posterior Lower Leg: 4-Layer Compression System - Left Lower Extremity. Elevate legs to the level of the heart and pump ankles as often as possible Medications-please add to medication list.: Wound #5 Left,Medial Lower Leg: Other: - Pentoxifylline 1. I change the primary dressing to silver alginate to except some of the moisture 2. I saw no evidence of infection 3. This could be because of the drop in her wrap that prompted her to come in earlier in the week. May be able to adjust the dressing next week 4. The edema control in her leg looks good. Amount of edema in her thigh appears to be adequate she has a functional stent in the left iliac vein Electronic Signature(s) Signed: 12/30/2019 5:01:35 PM By: Dellia Nims,  Legrand Como MD Entered By: Linton Ham on 12/30/2019 13:34:22 Willden, [name] -------------------------------------------------------------------------------- SuperBill Details Patient Name: ABIGEAL, POERTNER. Date of Service: 12/30/2019 Medical Record Number: [id number] Patient Account Number: [id number] Date of Birth/Sex: 1946-09-17 (74 y.o. 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 Facility Procedures CPT4 Code: [id number] Description: (Facility Use Only) [id number] - [place] LWR LT  LEG Modifier: Quantity: 1 Physician Procedures CPT4 [id number] Description: [id number] - WC PHYS LEVEL 3 - EST PT Modifier: Quantity: 1 CPT4 Code: Description: ICD-10 Diagnosis Description L97.221 Non-pressure chronic ulcer of left calf limited to breakdown of skin I87.332 Chronic venous hypertension (idiopathic) with ulcer and inflammation of left l I89.0 Lymphedema, not elsewhere classified Modifier: ower extremity Quantity: Electronic Signature(s) Signed: 12/30/2019 5:01:35 PM By: Linton Ham MD Entered By: Linton Ham on 12/30/2019 13:34:49

## 2019-12-30 NOTE — Progress Notes (Signed)
NAVIYA, HETZ ([id number]) Visit Report for 12/30/2019 Arrival Information Details Patient Name: [name], [name]. Date of Service: 12/30/2019 12:45 PM Medical Record Number: [id number] Patient Account Number: [id number] Date of Birth/Sex: [name] 30, 1946 (74 y.o. F) Treating RN: Montey Hora Primary Care Dhwani Venkatesh: [name] Other Clinician: Referring Rojean Ige: [name] Treating Dariya Gainer/Extender: Tito Dine in Treatment: 52 Visit Information History Since Last Visit Added or deleted any medications: No Patient Arrived: Ambulatory Any new allergies or adverse reactions: No Arrival Time: 12:47 Had a fall or experienced change in No Accompanied By: self activities of daily living that may affect Transfer Assistance: None risk of falls: Patient Identification Verified: Yes Signs or symptoms of abuse/neglect since last visito No Secondary Verification Process Completed: Yes Hospitalized since last visit: No Patient Requires Transmission-Based No Implantable device outside of the clinic excluding No Precautions: cellular tissue based products placed in the center Patient Has Alerts: Yes since last visit: Patient Alerts: Patient on Blood Has Dressing in Place as Prescribed: Yes Thinner Has Compression in Place as Prescribed: Yes aspirin 81 Pain Present Now: Yes Electronic Signature(s) Signed: 12/30/2019 4:59:40 PM By: Montey Hora Entered By: Montey Hora on 12/30/2019 12:48:35 Corron, [name] -------------------------------------------------------------------------------- Compression Therapy Details Patient Name: [name], [name]. Date of Service: 12/30/2019 12:45 PM Medical Record Number: [id number] Patient Account Number: [id number] Date of Birth/Sex: 07/26/1946 (74 y.o. F) Treating RN: Army Melia Primary Care Binnie Vonderhaar: [name] Other Clinician: Referring Shirel Mallis: [name] Treating Saharsh Sterling/Extender: Tito Dine in Treatment: 55 Compression Therapy Performed for Wound Assessment: Wound #5 Left,Medial Lower Leg Performed By: Clinician Army Melia, RN Compression Type: Four Layer Post Procedure Diagnosis Same as Pre-procedure Electronic Signature(s) Signed: 12/30/2019 4:52:55 PM By: Army Melia Entered By: Army Melia on 12/30/2019 13:14:54 Forstrom, [name] -------------------------------------------------------------------------------- Compression Therapy Details Patient Name: [name], [name]. Date of Service: 12/30/2019 12:45 PM Medical Record Number: [id number] Patient Account Number: [id number] Date of Birth/Sex: 07/19/1946 (74 y.o. F) Treating RN: Army Melia Primary Care Bernyce Brimley: [name] Other Clinician: Referring Rhet Rorke: [name] Treating Belissa Kooy/Extender: Tito Dine in Treatment: 55 Compression Therapy Performed for Wound Assessment: Wound #6 Left,Lateral Lower Leg Performed By: Clinician Army Melia, RN Compression Type: Four Layer Post Procedure Diagnosis Same as Pre-procedure Electronic Signature(s) Signed: 12/30/2019 4:52:55 PM By: Army Melia Entered By: Army Melia on 12/30/2019 13:14:54 Benda, [name] -------------------------------------------------------------------------------- Compression Therapy Details Patient Name: [name], [name]. Date of Service: 12/30/2019 12:45 PM Medical Record Number: [id number] Patient Account Number: [id number] Date of Birth/Sex: 09-09-1946 (74 y.o. F) Treating RN: Army Melia Primary Care Melvie Paglia: [name] Other Clinician: Referring Quantae Martel: [name] Treating Saida Lonon/Extender: Tito Dine in Treatment: 55 Compression Therapy Performed for Wound Assessment: Wound #9 Left,Lateral,Posterior Lower Leg Performed By: Clinician Army Melia, RN Compression Type: Four Layer Post Procedure Diagnosis Same as Pre-procedure Electronic  Signature(s) Signed: 12/30/2019 4:52:55 PM By: Army Melia Entered By: Army Melia on 12/30/2019 13:14:55 Behnke, [name] -------------------------------------------------------------------------------- Encounter Discharge Information Details Patient Name: [name], [name]. Date of Service: 12/30/2019 12:45 PM Medical Record Number: [id number] Patient Account Number: [id number] Date of Birth/Sex: 1946-04-03 (74 y.o. F) Treating RN: Army Melia Primary Care Talonda Artist: [name] Other Clinician: Referring Erasto Sleight: [name] Treating Caressa Scearce/Extender: Tito Dine in Treatment: 33 Encounter Discharge Information Items Discharge Condition: Stable Ambulatory Status: Ambulatory Discharge Destination: Home Transportation: Private Auto Accompanied By: self Schedule Follow-up Appointment: Yes Clinical Summary of Care: Electronic Signature(s) Signed: 12/30/2019 4:52:55 PM  By: Army Melia Entered By: Army Melia on 12/30/2019 13:17:26 Dolson, [name] ([id number]) -------------------------------------------------------------------------------- Lower Extremity Assessment Details Patient Name: [name], [name]. Date of Service: 12/30/2019 12:45 PM Medical Record Number: [id number] Patient Account Number: [id number] Date of Birth/Sex: [birth year] (74 y.o. F) Treating RN: Montey Hora Primary Care Truth Barot: [name] Other Clinician: Referring Amadi Yoshino: [name] Treating Wanisha Shiroma/Extender: Tito Dine in Treatment: 55 Edema Assessment Assessed: [Left: No] [Right: No] Edema: [Left: Ye] [Right: s] Calf Left: Right: Point of Measurement: 33 cm From Medial Instep 46 cm cm Ankle Left: Right: Point of Measurement: 10 cm From Medial Instep 24.3 cm cm Vascular Assessment Pulses: Dorsalis Pedis Palpable: [Left:Yes] Electronic Signature(s) Signed: 12/30/2019 4:59:40 PM By: Montey Hora Entered By: Montey Hora on 12/30/2019  12:54:16 Girardot, [name] ([id number]) -------------------------------------------------------------------------------- Multi Wound Chart Details Patient Name: [name], [name]. Date of Service: 12/30/2019 12:45 PM Medical Record Number: [id number] Patient Account Number: [id number] Date of Birth/Sex: 03-26-1946 (74 y.o. F) Treating RN: Army Melia Primary Care Kori Colin: [name] Other Clinician: Referring Minela Bridgewater: [name] Treating Chrisoula Zegarra/Extender: Tito Dine in Treatment: 15 Vital Signs Height(in): 65 Pulse(bpm): 35 Weight(lbs): 224.7 Blood Pressure(mmHg): 143/79 Body Mass Index(BMI): 40 Temperature(F): 98.4 Respiratory Rate(breaths/min): 16 Photos: Wound Location: Left Lower Leg - Medial Left Lower Leg - Lateral Left Lower Leg - Lateral, Posterior Wounding Event: Gradually Appeared Gradually Appeared Gradually Appeared Primary Etiology: Lymphedema Venous Leg Ulcer Venous Leg Ulcer Comorbid History: Cataracts, Asthma, Sleep Apnea, Cataracts, Asthma, Sleep Apnea, Cataracts, Asthma, Sleep Apnea, Deep Vein Thrombosis, Deep Vein Thrombosis, Deep Vein Thrombosis, Hypertension, Peripheral Venous Hypertension, Peripheral Venous Hypertension, Peripheral Venous Disease, Osteoarthritis, Received Disease, Osteoarthritis, Received Disease, Osteoarthritis, Received Chemotherapy, Received Radiation Chemotherapy, Received Radiation Chemotherapy, Received Radiation Date Acquired: 11/19/2018 01/19/2019 07/08/2019 Weeks of Treatment: 55 49 25 Wound Status: Open Open Open Measurements L x W x D (cm) 2.6x2.4x0.2 4.2x2x0.2 0.6x0.6x0.1 Area (cm) : 4.901 6.597 0.283 Volume (cm) : 0.98 1.319 0.028 % Reduction in Area: 42.60% -2898.60% 49.90% % Reduction in Volume: -14.60% -5895.50% 50.90% Classification: Full Thickness Without Exposed Full Thickness Without Exposed Full Thickness Without Exposed Support Structures Support Structures Support Structures Exudate Amount:  Large Large Large Exudate Type: Serosanguineous Serosanguineous Serosanguineous Exudate Color: red, brown red, brown red, brown Wound Margin: Indistinct, nonvisible Flat and Intact Flat and Intact Granulation Amount: Small (1-33%) Small (1-33%) Small (1-33%) Granulation Quality: Pale Pink Pink Necrotic Amount: Large (67-100%) Large (67-100%) Large (67-100%) Exposed Structures: Fat Layer (Subcutaneous Tissue) Fat Layer (Subcutaneous Tissue) Fat Layer (Subcutaneous Tissue) Exposed: Yes Exposed: Yes Exposed: Yes Fascia: No Fascia: No Fascia: No Tendon: No Tendon: No Tendon: No Muscle: No Muscle: No Muscle: No Joint: No Joint: No Joint: No Bone: No Bone: No Bone: No Epithelialization: None None None Procedures Performed: Compression Therapy Compression Therapy Compression Therapy Treatment Notes Wound #5 (Left, Medial Lower Leg) Schnelle, Farrell J. ([id number]) Notes silver cell, drawtex, abd, 4 layer Wound #6 (Left, Lateral Lower Leg) Notes silver cell, drawtex, abd, 4 layer Wound #9 (Left, Lateral, Posterior Lower Leg) Notes silver cell, drawtex, abd, 4 layer Electronic Signature(s) Signed: 12/30/2019 5:01:35 PM By: Linton Ham MD Entered By: Linton Ham on 12/30/2019 13:30:30 Kostelecky, [name] ([id number]) -------------------------------------------------------------------------------- Multi-Disciplinary Care Plan Details Patient Name: [name], [name]. Date of Service: 12/30/2019 12:45 PM Medical Record Number: [id number] Patient Account Number: [id number] Date of Birth/Sex: 26-Sep-1946 (74 y.o. F) Treating RN: Army Melia Primary Care [name] Cornforth: [name] Other Clinician: Referring Sadira Standard: [name] Treating Madeliene Tejera/Extender: Tito Dine  in Treatment: 55 Active Inactive Medication Nursing Diagnoses: Knowledge deficit related to medication safety: actual or potential Goals: Patient/caregiver will demonstrate understanding of new  oral/IV medications prescribed at the [facility] (topical prescriptions are covered under the skin breakdown problem) Date Initiated: 12/16/2019 Target Resolution Date: 01/13/2020 Goal Status: Active Interventions: Assess for medication contraindications each visit where new medications are prescribed Treatment Activities: New medication prescribed at [place] : 12/16/2019 Notes: Soft Tissue Infection Nursing Diagnoses: Impaired tissue integrity Goals: Patient's soft tissue infection will resolve Date Initiated: 12/10/2018 Target Resolution Date: 01/09/2019 Goal Status: Active Interventions: Assess signs and symptoms of infection every visit Notes: Venous Leg Ulcer Nursing Diagnoses: Actual venous Insuffiency (use after diagnosis is confirmed) Goals: Patient will maintain optimal edema control Date Initiated: 12/10/2018 Target Resolution Date: 01/09/2019 Goal Status: Active Interventions: Assess peripheral edema status every visit. Treatment Activities: Therapeutic compression applied : 12/10/2018 QUINIYAH, FRITZSCHE ([id number]) Notes: Wound/Skin Impairment Nursing Diagnoses: Impaired tissue integrity Goals: Patient/caregiver will verbalize understanding of skin care regimen Date Initiated: 12/10/2018 Target Resolution Date: 01/09/2019 Goal Status: Active Interventions: Assess ulceration(s) every visit Treatment Activities: Topical wound management initiated : 12/10/2018 Notes: Electronic Signature(s) Signed: 12/30/2019 4:52:55 PM By: Army Melia Entered By: Army Melia on 12/30/2019 13:13:14 Erskin, [name] -------------------------------------------------------------------------------- Pain Assessment Details Patient Name: ELLEANA, ARCEGA. Date of Service: 12/30/2019 12:45 PM Medical Record Number: [id number] Patient Account Number: [id number] Date of Birth/Sex: [birth year] (74 y.o. F) Treating RN: Montey Hora Primary Care Etoy Mcdonnell: [name] Other  Clinician: Referring Heddy Vidana: [name] Treating Billye Pickerel/Extender: Tito Dine in Treatment: 55 Active Problems Location of Pain Severity and Description of Pain Patient Has Paino Yes Site Locations Pain Location: Pain in Ulcers With Dressing Change: Yes Duration of the Pain. Constant / Intermittento Intermittent Character of Pain Describe the Pain: Burning Pain Management and Medication Current Pain Management: Electronic Signature(s) Signed: 12/30/2019 4:59:40 PM By: Montey Hora Entered By: Montey Hora on 12/30/2019 12:52:56 Hartline, [name] -------------------------------------------------------------------------------- Patient/Caregiver Education Details Patient Name: [name], [name]. Date of Service: 12/30/2019 12:45 PM Medical Record Number: [id number] Patient Account Number: [id number] Date of Birth/Gender: [birth year] (74 y.o. F) Treating RN: Army Melia Primary Care Physician: [name] Other Clinician: Referring Physician: [name] Treating Physician/Extender: Tito Dine in Treatment: 47 Education Assessment Education Provided To: Patient Education Topics Provided Wound/Skin Impairment: Handouts: Caring for Your Ulcer Methods: Demonstration, Explain/Verbal Responses: State content correctly Electronic Signature(s) Signed: 12/30/2019 4:52:55 PM By: Army Melia Entered By: Army Melia on 12/30/2019 13:16:46 Amezcua, [name] -------------------------------------------------------------------------------- Wound Assessment Details Patient Name: [name], [name]. Date of Service: 12/30/2019 12:45 PM Medical Record Number: [id number] Patient Account Number: [id number] Date of Birth/Sex: 02-09-1946 (74 y.o. F) Treating RN: Montey Hora Primary Care Charvez Voorhies: [name] Other Clinician: Referring Jusitn Salsgiver: [name] Treating Evea Sheek/Extender: Tito Dine in  Treatment: 67 Wound Status Wound Number: 5 Primary Lymphedema Etiology: Wound Location: Left Lower Leg - Medial Wound Open Wounding Event: Gradually Appeared Status: Date Acquired: 11/19/2018 Comorbid Cataracts, Asthma, Sleep Apnea, Deep Vein Thrombosis, Weeks Of Treatment: 55 History: Hypertension, Peripheral Venous Disease, Osteoarthritis, Clustered Wound: No Received Chemotherapy, Received Radiation Photos Wound Measurements Length: (cm) 2.6 Width: (cm) 2.4 Depth: (cm) 0.2 Area: (cm) 4.901 Volume: (cm) 0.98 % Reduction in Area: 42.6% % Reduction in Volume: -14.6% Epithelialization: None Tunneling: No Undermining: No Wound Description Classification: Full Thickness Without Exposed Support Struc Wound Margin: Indistinct, nonvisible Exudate Amount: Large Exudate Type: Serosanguineous Exudate Color: red, brown tures Foul  Odor After Cleansing: No Slough/Fibrino Yes Wound Bed Granulation Amount: Small (1-33%) Exposed Structure Granulation Quality: Pale Fascia Exposed: No Necrotic Amount: Large (67-100%) Fat Layer (Subcutaneous Tissue) Exposed: Yes Necrotic Quality: Adherent Slough Tendon Exposed: No Muscle Exposed: No Joint Exposed: No Bone Exposed: No Treatment Notes Wound #5 (Left, Medial Lower Leg) Notes silver cell, drawtex, abd, 4 layer Electronic Signature(s) TAKERA, SINGLETERRY ([id number]) Signed: 12/30/2019 4:59:40 PM By: Montey Hora Entered By: Montey Hora on 12/30/2019 13:00:13 [name] -------------------------------------------------------------------------------- Wound Assessment Details Patient Name: [name], [name]. Date of Service: 12/30/2019 12:45 PM Medical Record Number: [id number] Patient Account Number: [id number] Date of Birth/Sex: [birth year] (74 y.o. F) Treating RN: Montey Hora Primary Care Kmari Brian: [name] Other Clinician: Referring Chessie Neuharth: [name] Treating Brooklen Runquist/Extender: Tito Dine in Treatment: 47 Wound Status Wound Number: 6 Primary Venous Leg Ulcer Etiology: Wound Location: Left Lower Leg - Lateral Wound Open Wounding Event: Gradually Appeared Status: Date Acquired: 01/19/2019 Comorbid Cataracts, Asthma, Sleep Apnea, Deep Vein Thrombosis, Weeks Of Treatment: 49 History: Hypertension, Peripheral Venous Disease, Osteoarthritis, Clustered Wound: No Received Chemotherapy, Received Radiation Photos Wound Measurements Length: (cm) 4.2 Width: (cm) 2 Depth: (cm) 0.2 Area: (cm) 6.597 Volume: (cm) 1.319 % Reduction in Area: -2898.6% % Reduction in Volume: -5895.5% Epithelialization: None Tunneling: No Undermining: No Wound Description Classification: Full Thickness Without Exposed Support Struc Wound Margin: Flat and Intact Exudate Amount: Large Exudate Type: Serosanguineous Exudate Color: red, brown tures Foul Odor After Cleansing: No Slough/Fibrino Yes Wound Bed Granulation Amount: Small (1-33%) Exposed Structure Granulation Quality: Pink Fascia Exposed: No Necrotic Amount: Large (67-100%) Fat Layer (Subcutaneous Tissue) Exposed: Yes Necrotic Quality: Adherent Slough Tendon Exposed: No Muscle Exposed: No Joint Exposed: No Bone Exposed: No Treatment Notes Wound #6 (Left, Lateral Lower Leg) Notes silver cell, drawtex, abd, 4 layer Electronic Signature(s) AREMI, DEATON ([id number]) Signed: 12/30/2019 4:59:40 PM By: Montey Hora Entered By: Montey Hora on 12/30/2019 13:01:23 Hibbitts, [name] -------------------------------------------------------------------------------- Wound Assessment Details Patient Name: [name], [name]. Date of Service: 12/30/2019 12:45 PM Medical Record Number: [id number] Patient Account Number: [id number] Date of Birth/Sex: 09-29-1946 (74 y.o. F) Treating RN: Montey Hora Primary Care Nevah Dalal: [name] Other Clinician: Referring Gerrett Loman: [name] Treating  Nazareth Kirk/Extender: Tito Dine in Treatment: 29 Wound Status Wound Number: 9 Primary Venous Leg Ulcer Etiology: Wound Location: Left Lower Leg - Lateral, Posterior Wound Open Wounding Event: Gradually Appeared Status: Date Acquired: 07/08/2019 Comorbid Cataracts, Asthma, Sleep Apnea, Deep Vein Thrombosis, Weeks Of Treatment: 25 History: Hypertension, Peripheral Venous Disease, Osteoarthritis, Clustered Wound: No Received Chemotherapy, Received Radiation Photos Wound Measurements Length: (cm) 0.6 Width: (cm) 0.6 Depth: (cm) 0.1 Area: (cm) 0.283 Volume: (cm) 0.028 % Reduction in Area: 49.9% % Reduction in Volume: 50.9% Epithelialization: None Tunneling: No Undermining: No Wound Description Classification: Full Thickness Without Exposed Support Struc Wound Margin: Flat and Intact Exudate Amount: Large Exudate Type: Serosanguineous Exudate Color: red, brown tures Foul Odor After Cleansing: No Slough/Fibrino Yes Wound Bed Granulation Amount: Small (1-33%) Exposed Structure Granulation Quality: Pink Fascia Exposed: No Necrotic Amount: Large (67-100%) Fat Layer (Subcutaneous Tissue) Exposed: Yes Necrotic Quality: Adherent Slough Tendon Exposed: No Muscle Exposed: No Joint Exposed: No Bone Exposed: No Treatment Notes Wound #9 (Left, Lateral, Posterior Lower Leg) Notes silver cell, drawtex, abd, 4 layer Electronic Signature(s) QUNESHA, WASHABAUGH ([id number]) Signed: 12/30/2019 4:59:40 PM By: Montey Hora Entered By: Montey Hora on 12/30/2019 13:02:36 Gehres, [name] -------------------------------------------------------------------------------- Vitals Details Patient Name: KILEIGH, TREGONING. Date  of Service: 12/30/2019 12:45 PM Medical Record Number: [id number] Patient Account Number: [id number] Date of Birth/Sex: 01-31-1946 (74 y.o. F) Treating RN: Montey Hora Primary Care [name] Brightbill: [name] Other Clinician: Referring Jarrah Babich:  [name] Treating Kelie Gainey/Extender: Tito Dine in Treatment: 55 Vital Signs Time Taken: 12:50 Temperature (F): 98.4 Height (in): 63 Pulse (bpm): 79 Weight (lbs): 224.7 Respiratory Rate (breaths/min): 16 Body Mass Index (BMI): 39.8 Blood Pressure (mmHg): 143/79 Reference Range: 80 - 120 mg / dl Electronic Signature(s) Signed: 12/30/2019 4:59:40 PM By: Montey Hora Entered By: Montey Hora on 12/30/2019 12:51:44

## 2020-01-06 ENCOUNTER — Telehealth: Payer: Self-pay | Admitting: *Deleted

## 2020-01-06 ENCOUNTER — Other Ambulatory Visit: Payer: Self-pay

## 2020-01-06 ENCOUNTER — Encounter: Payer: Self-pay | Admitting: Family Medicine

## 2020-01-06 ENCOUNTER — Encounter: Payer: Medicare Other | Attending: Internal Medicine | Admitting: Internal Medicine

## 2020-01-06 ENCOUNTER — Ambulatory Visit (INDEPENDENT_AMBULATORY_CARE_PROVIDER_SITE_OTHER): Payer: Medicare Other | Admitting: Family Medicine

## 2020-01-06 VITALS — BP 130/84 | HR 84 | Temp 97.9°F | Ht 62.0 in | Wt 222.3 lb

## 2020-01-06 DIAGNOSIS — R195 Other fecal abnormalities: Secondary | ICD-10-CM | POA: Insufficient documentation

## 2020-01-06 DIAGNOSIS — I83892 Varicose veins of left lower extremities with other complications: Secondary | ICD-10-CM | POA: Diagnosis not present

## 2020-01-06 DIAGNOSIS — Z882 Allergy status to sulfonamides status: Secondary | ICD-10-CM | POA: Insufficient documentation

## 2020-01-06 DIAGNOSIS — L97929 Non-pressure chronic ulcer of unspecified part of left lower leg with unspecified severity: Secondary | ICD-10-CM | POA: Diagnosis not present

## 2020-01-06 DIAGNOSIS — I87312 Chronic venous hypertension (idiopathic) with ulcer of left lower extremity: Secondary | ICD-10-CM | POA: Diagnosis not present

## 2020-01-06 DIAGNOSIS — G4733 Obstructive sleep apnea (adult) (pediatric): Secondary | ICD-10-CM | POA: Insufficient documentation

## 2020-01-06 DIAGNOSIS — L97222 Non-pressure chronic ulcer of left calf with fat layer exposed: Secondary | ICD-10-CM | POA: Diagnosis not present

## 2020-01-06 DIAGNOSIS — S81802A Unspecified open wound, left lower leg, initial encounter: Secondary | ICD-10-CM | POA: Diagnosis not present

## 2020-01-06 DIAGNOSIS — J45909 Unspecified asthma, uncomplicated: Secondary | ICD-10-CM | POA: Diagnosis not present

## 2020-01-06 DIAGNOSIS — I87332 Chronic venous hypertension (idiopathic) with ulcer and inflammation of left lower extremity: Secondary | ICD-10-CM | POA: Insufficient documentation

## 2020-01-06 DIAGNOSIS — I10 Essential (primary) hypertension: Secondary | ICD-10-CM | POA: Diagnosis not present

## 2020-01-06 DIAGNOSIS — M1990 Unspecified osteoarthritis, unspecified site: Secondary | ICD-10-CM | POA: Insufficient documentation

## 2020-01-06 DIAGNOSIS — I890 Lymphedema, not elsewhere classified: Secondary | ICD-10-CM | POA: Diagnosis not present

## 2020-01-06 DIAGNOSIS — L97221 Non-pressure chronic ulcer of left calf limited to breakdown of skin: Secondary | ICD-10-CM | POA: Diagnosis not present

## 2020-01-06 DIAGNOSIS — I872 Venous insufficiency (chronic) (peripheral): Secondary | ICD-10-CM | POA: Diagnosis not present

## 2020-01-06 DIAGNOSIS — G629 Polyneuropathy, unspecified: Secondary | ICD-10-CM | POA: Insufficient documentation

## 2020-01-06 DIAGNOSIS — R609 Edema, unspecified: Secondary | ICD-10-CM | POA: Diagnosis not present

## 2020-01-06 DIAGNOSIS — Z7902 Long term (current) use of antithrombotics/antiplatelets: Secondary | ICD-10-CM | POA: Insufficient documentation

## 2020-01-06 DIAGNOSIS — I83029 Varicose veins of left lower extremity with ulcer of unspecified site: Secondary | ICD-10-CM

## 2020-01-06 DIAGNOSIS — M329 Systemic lupus erythematosus, unspecified: Secondary | ICD-10-CM | POA: Insufficient documentation

## 2020-01-06 MED ORDER — MELOXICAM 15 MG PO TABS: 15.0000 mg | ORAL_TABLET | Freq: Every day | ORAL | 1 refills | Status: DC | PRN

## 2020-01-06 MED ORDER — FAMOTIDINE 20 MG PO TABS: 20.0000 mg | ORAL_TABLET | Freq: Every day | ORAL | Status: DC

## 2020-01-06 NOTE — Telephone Encounter (Signed)
Patient called stating that she has had diarrhea/loose stools and stomach issues for over a month. Patient stated that she has been on different medications recently and thought that the medications may be causing her symptoms. Patient stated that the doctor at the wound care center told her that the mediations would not be causing her symptoms. Patient stated that she has been having gurgling in he stomach and multiple loose stools. Patient denies any covid symptoms with the exception of diarrhea that has been going on for over a month. Patient has an appointment today at the wound center. Patient scheduled for an office visit with Dr. Danise Mina today at 3:45 pm.

## 2020-01-06 NOTE — Patient Instructions (Addendum)
Reassuring exam today.  Monitor symptoms for now.  Let me know if ongoing trouble after finishing pentoxifylline.

## 2020-01-06 NOTE — Progress Notes (Signed)
Amber, [name] ([id number]) Visit Report for 01/06/2020 Arrival Information Details Patient Name: [name], [name]. Date of Service: 01/06/2020 12:45 PM Medical Record Number: [id number] Patient Account Number: [id number] Date of Birth/Sex: 08/06/1946 (74 y.o. F) Treating RN: Montey Hora Primary Care Solana Coggin: [name] Other Clinician: Referring Shenicka Sunderlin: [name] Treating Maxamillion Banas/Extender: Tito Dine in Treatment: 69 Visit Information History Since Last Visit Added or deleted any medications: No Patient Arrived: Ambulatory Any new allergies or adverse reactions: No Arrival Time: 12:45 Had a fall or experienced change in No Accompanied By: self activities of daily living that may affect Transfer Assistance: None risk of falls: Patient Identification Verified: Yes Signs or symptoms of abuse/neglect since last visito No Secondary Verification Process Completed: Yes Hospitalized since last visit: No Patient Requires Transmission-Based No Implantable device outside of the clinic excluding No Precautions: cellular tissue based products placed in the center Patient Has Alerts: Yes since last visit: Patient Alerts: Patient on Blood Has Dressing in Place as Prescribed: Yes Thinner Has Compression in Place as Prescribed: Yes aspirin 81 Pain Present Now: Yes Electronic Signature(s) Signed: 01/06/2020 4:27:26 PM By: Montey Hora Entered By: Montey Hora on 01/06/2020 12:47:13 Donaway, [name] -------------------------------------------------------------------------------- Compression Therapy Details Patient Name: Amber, [name]. Date of Service: 01/06/2020 12:45 PM Medical Record Number: [id number] Patient Account Number: [id number] Date of Birth/Sex: 1946-03-26 (74 y.o. F) Treating RN: Cornell Barman Primary Care Taia Bramlett: [name] Other Clinician: Referring Latressa Harries: [name] Treating Inita Uram/Extender: Tito Dine in Treatment: 56 Compression Therapy Performed for Wound Assessment: Wound #9 Left,Lateral,Posterior Lower Leg Performed By: Clinician Cornell Barman, RN Compression Type: Four Layer Pre Treatment ABI: 1 Post Procedure Diagnosis Same as Pre-procedure Electronic Signature(s) Signed: 01/06/2020 4:40:39 PM By: [name], [name], RN, CWS, [name], [name] Entered By: [name], [name], RN, CWS, Kim on 01/06/2020 13:10:35 [name] -------------------------------------------------------------------------------- Encounter Discharge Information Details Patient Name: Amber, [name]. Date of Service: 01/06/2020 12:45 PM Medical Record Number: [id number] Patient Account Number: [id number] Date of Birth/Sex: [birth year] (74 y.o. F) Treating RN: Cornell Barman Primary Care Dantavious Snowball: [name] Other Clinician: Referring Kele Withem: [name] Treating Tiphani Mells/Extender: Tito Dine in Treatment: 18 Encounter Discharge Information Items Discharge Condition: Stable Ambulatory Status: Ambulatory Discharge Destination: Home Transportation: Other Accompanied By: self Schedule Follow-up Appointment: Yes Clinical Summary of Care: Electronic Signature(s) Signed: 01/06/2020 4:40:39 PM By: [name], [name], RN, CWS, [name], [name] Entered By: [name], [name], RN, CWS, Kim on 01/06/2020 13:13:03 [name] -------------------------------------------------------------------------------- Lower Extremity Assessment Details Patient Name: [name], [name]. Date of Service: 01/06/2020 12:45 PM Medical Record Number: [id number] Patient Account Number: [id number] Date of Birth/Sex: 1945/11/11 (74 y.o. F) Treating RN: Montey Hora Primary Care Ceasar Decandia: [name] Other Clinician: Referring Teonna Coonan: [name] Treating Courtny Bennison/Extender: Tito Dine in Treatment: 56 Edema Assessment Assessed: [Left: No] [Right: No] Edema: [Left: Ye] [Right: s] Calf Left:  Right: Point of Measurement: 33 cm From Medial Instep 42 cm cm Ankle Left: Right: Point of Measurement: 10 cm From Medial Instep 24.5 cm cm Vascular Assessment Pulses: Dorsalis Pedis Palpable: [Left:Yes] Electronic Signature(s) Signed: 01/06/2020 4:27:26 PM By: Montey Hora Entered By: Montey Hora on 01/06/2020 12:50:47 Clere, [name] -------------------------------------------------------------------------------- Multi Wound Chart Details Patient Name: Amber, [name]. Date of Service: 01/06/2020 12:45 PM Medical Record Number: [id number] Patient Account Number: [id number] Date of Birth/Sex: [birth year] (74 y.o. F) Treating RN: Cornell Barman Primary Care Warren Lindahl: [name] Other Clinician: Referring Lameshia Hypolite: [name] Treating  Dayla Gasca/Extender: [name] Dillon Weeks in Treatment: 56 Vital Signs Height(in): 63 Pulse(bpm): 83 Weight(lbs): 224.7 Blood Pressure(mmHg): 157/70 Body Mass Index(BMI): 40 Temperature(F): 98.7 Respiratory Rate(breaths/min): 16 Photos: Wound Location: Left Lower Leg - Medial Left Lower Leg - Lateral Left Lower Leg - Lateral, Posterior Wounding Event: Gradually Appeared Gradually Appeared Gradually Appeared Primary Etiology: Lymphedema Venous Leg Ulcer Venous Leg Ulcer Comorbid History: Cataracts, Asthma, Sleep Apnea, Cataracts, Asthma, Sleep Apnea, Cataracts, Asthma, Sleep Apnea, Deep Vein Thrombosis, Deep Vein Thrombosis, Deep Vein Thrombosis, Hypertension, Peripheral Venous Hypertension, Peripheral Venous Hypertension, Peripheral Venous Disease, Osteoarthritis, Received Disease, Osteoarthritis, Received Disease, Osteoarthritis, Received Chemotherapy, Received Radiation Chemotherapy, Received Radiation Chemotherapy, Received Radiation Date Acquired: 11/19/2018 01/19/2019 07/08/2019 Weeks of Treatment: 56 50 26 Wound Status: Open Open Open Measurements L x W x D (cm) 4.5x2.5x0.2 5x4.5x0.2 1x1x0.1 Area (cm) : 8.836 17.671  0.785 Volume (cm) : 1.767 3.534 0.079 % Reduction in Area: -3.40% -7932.30% -38.90% % Reduction in Volume: -106.70% -15963.60% -38.60% Classification: Full Thickness Without Exposed Full Thickness Without Exposed Full Thickness Without Exposed Support Structures Support Structures Support Structures Exudate Amount: Large Large Large Exudate Type: Serosanguineous Serosanguineous Serosanguineous Exudate Color: red, brown red, brown red, brown Wound Margin: Indistinct, nonvisible Flat and Intact Flat and Intact Granulation Amount: Large (67-100%) Medium (34-66%) Small (1-33%) Granulation Quality: Pale Pink Pink Necrotic Amount: Small (1-33%) Medium (34-66%) Large (67-100%) Exposed Structures: Fat Layer (Subcutaneous Tissue) Fat Layer (Subcutaneous Tissue) Fat Layer (Subcutaneous Tissue) Exposed: Yes Exposed: Yes Exposed: Yes Fascia: No Fascia: No Fascia: No Tendon: No Tendon: No Tendon: No Muscle: No Muscle: No Muscle: No Joint: No Joint: No Joint: No Bone: No Bone: No Bone: No Epithelialization: None None None Procedures Performed: N/A N/A Compression Therapy Treatment Notes Wound #5 (Left, Medial Lower Leg) Kosel, Hallel J. ([id number]) Notes silver cell, drawtex, abd, 4 layer Wound #6 (Left, Lateral Lower Leg) Notes silver cell, drawtex, abd, 4 layer Wound #9 (Left, Lateral, Posterior Lower Leg) Notes silver cell, drawtex, abd, 4 layer Electronic Signature(s) Signed: 01/06/2020 4:59:37 PM By: Linton Ham MD Entered By: Linton Ham on 01/06/2020 13:12:24 [name] ([id number]) -------------------------------------------------------------------------------- Multi-Disciplinary Care Plan Details Patient Name: SHAMARIAH, SYBERT. Date of Service: 01/06/2020 12:45 PM Medical Record Number: [id number] Patient Account Number: [id number] Date of Birth/Sex: Sep 22, 1946 (74 y.o. F) Treating RN: Cornell Barman Primary Care Olesya Wike: [name] Other  Clinician: Referring Osiris Odriscoll: [name] Treating Kaedon Fanelli/Extender: Tito Dine in Treatment: 9 Active Inactive Medication Nursing Diagnoses: Knowledge deficit related to medication safety: actual or potential Goals: Patient/caregiver will demonstrate understanding of new oral/IV medications prescribed at the [facility] (topical prescriptions are covered under the skin breakdown problem) Date Initiated: 12/16/2019 Target Resolution Date: 01/13/2020 Goal Status: Active Interventions: Assess for medication contraindications each visit where new medications are prescribed Treatment Activities: New medication prescribed at [place] : 12/16/2019 Notes: Soft Tissue Infection Nursing Diagnoses: Impaired tissue integrity Goals: Patient's soft tissue infection will resolve Date Initiated: 12/10/2018 Target Resolution Date: 01/09/2019 Goal Status: Active Interventions: Assess signs and symptoms of infection every visit Notes: Venous Leg Ulcer Nursing Diagnoses: Actual venous Insuffiency (use after diagnosis is confirmed) Goals: Patient will maintain optimal edema control Date Initiated: 12/10/2018 Target Resolution Date: 01/09/2019 Goal Status: Active Interventions: Assess peripheral edema status every visit. Treatment Activities: Therapeutic compression applied : 12/10/2018 DIANNAH, LOSI ([id number]) Notes: Wound/Skin Impairment Nursing Diagnoses: Impaired tissue integrity Goals: Patient/caregiver will verbalize understanding of skin care regimen Date Initiated: 12/10/2018 Target Resolution Date: 01/09/2019 Goal Status: Active Interventions: Assess ulceration(s)  every visit Treatment Activities: Topical wound management initiated : 12/10/2018 Notes: Electronic Signature(s) Signed: 01/06/2020 4:40:39 PM By: [name], [name], RN, CWS, [name], [name] Entered By: [name], [name], RN, CWS, Kim on 01/06/2020 13:04:06 YASMEN, GALLICK  ([id number]) -------------------------------------------------------------------------------- Pain Assessment Details Patient Name: YSATIS, TAMBASCO. Date of Service: 01/06/2020 12:45 PM Medical Record Number: [id number] Patient Account Number: [id number] Date of Birth/Sex: 01-Jun-1946 (74 y.o. F) Treating RN: Montey Hora Primary Care Mechelle Pates: [name] Other Clinician: Referring Vishal Sandlin: [name] Treating Leanah Kolander/Extender: Tito Dine in Treatment: 56 Active Problems Location of Pain Severity and Description of Pain Patient Has Paino Yes Site Locations Pain Location: Pain in Ulcers Pain Management and Medication Current Pain Management: Electronic Signature(s) Signed: 01/06/2020 4:27:26 PM By: Montey Hora Entered By: Montey Hora on 01/06/2020 12:47:48 Doerner, [name] -------------------------------------------------------------------------------- Patient/Caregiver Education Details Patient Name: MORGEN, TWERSKY. Date of Service: 01/06/2020 12:45 PM Medical Record Number: [id number] Patient Account Number: [id number] Date of Birth/Gender: 24-[name]-1946 (74 y.o. F) Treating RN: Cornell Barman Primary Care Physician: [name] Other Clinician: Referring Physician: [name] Treating Physician/Extender: Tito Dine in Treatment: 32 Education Assessment Education Provided To: Patient Education Topics Provided Wound/Skin Impairment: Handouts: Caring for Your Ulcer Methods: Demonstration, Explain/Verbal Responses: State content correctly Electronic Signature(s) Signed: 01/06/2020 4:40:39 PM By: [name], [name], RN, CWS, [name], [name] Entered By: [name], [name], RN, CWS, Kim on 01/06/2020 13:11:48 [name] -------------------------------------------------------------------------------- Wound Assessment Details Patient Name: KIFFANY, RINGHAM. Date of Service: 01/06/2020 12:45 PM Medical Record Number:  [id number] Patient Account Number: [id number] Date of Birth/Sex: [birth year] (74 y.o. F) Treating RN: Montey Hora Primary Care Alonnie Bieker: [name] Other Clinician: Referring Genevieve Ritzel: [name] Treating Shauntavia Brackin/Extender: Tito Dine in Treatment: 53 Wound Status Wound Number: 5 Primary Lymphedema Etiology: Wound Location: Left Lower Leg - Medial Wound Open Wounding Event: Gradually Appeared Status: Date Acquired: 11/19/2018 Comorbid Cataracts, Asthma, Sleep Apnea, Deep Vein Thrombosis, Weeks Of Treatment: 56 History: Hypertension, Peripheral Venous Disease, Osteoarthritis, Clustered Wound: No Received Chemotherapy, Received Radiation Photos Wound Measurements Length: (cm) 4.5 Width: (cm) 2.5 Depth: (cm) 0.2 Area: (cm) 8.836 Volume: (cm) 1.767 % Reduction in Area: -3.4% % Reduction in Volume: -106.7% Epithelialization: None Tunneling: No Undermining: No Wound Description Classification: Full Thickness Without Exposed Support Struc Wound Margin: Indistinct, nonvisible Exudate Amount: Large Exudate Type: Serosanguineous Exudate Color: red, brown tures Foul Odor After Cleansing: No Slough/Fibrino Yes Wound Bed Granulation Amount: Large (67-100%) Exposed Structure Granulation Quality: Pale Fascia Exposed: No Necrotic Amount: Small (1-33%) Fat Layer (Subcutaneous Tissue) Exposed: Yes Necrotic Quality: Adherent Slough Tendon Exposed: No Muscle Exposed: No Joint Exposed: No Bone Exposed: No Treatment Notes Wound #5 (Left, Medial Lower Leg) Notes silver cell, drawtex, abd, 4 layer Electronic Signature(s) KHALEESIA, BRIEGER ([id number]) Signed: 01/06/2020 4:27:26 PM By: Montey Hora Entered By: Montey Hora on 01/06/2020 12:55:35 Pellerito, [name] -------------------------------------------------------------------------------- Wound Assessment Details Patient Name: ANNECY, STEFF. Date of Service: 01/06/2020 12:45 PM Medical  Record Number: [id number] Patient Account Number: [id number] Date of Birth/Sex: [birth year] (74 y.o. F) Treating RN: Montey Hora Primary Care Hamilton Marinello: [name] Other Clinician: Referring Maximus Hoffert: [name] Treating Mady Oubre/Extender: Tito Dine in Treatment: 76 Wound Status Wound Number: 6 Primary Venous Leg Ulcer Etiology: Wound Location: Left Lower Leg - Lateral Wound Open Wounding Event: Gradually Appeared Status: Date Acquired: 01/19/2019 Comorbid Cataracts, Asthma, Sleep Apnea, Deep Vein Thrombosis, Weeks Of Treatment: 50 History: Hypertension, Peripheral Venous Disease, Osteoarthritis, Clustered Wound: No Received  Chemotherapy, Received Radiation Photos Wound Measurements Length: (cm) 5 Width: (cm) 4.5 Depth: (cm) 0.2 Area: (cm) 17.671 Volume: (cm) 3.534 % Reduction in Area: -7932.3% % Reduction in Volume: -15963.6% Epithelialization: None Wound Description Classification: Full Thickness Without Exposed Support Struc Wound Margin: Flat and Intact Exudate Amount: Large Exudate Type: Serosanguineous Exudate Color: red, brown tures Foul Odor After Cleansing: No Slough/Fibrino Yes Wound Bed Granulation Amount: Medium (34-66%) Exposed Structure Granulation Quality: Pink Fascia Exposed: No Necrotic Amount: Medium (34-66%) Fat Layer (Subcutaneous Tissue) Exposed: Yes Necrotic Quality: Adherent Slough Tendon Exposed: No Muscle Exposed: No Joint Exposed: No Bone Exposed: No Treatment Notes Wound #6 (Left, Lateral Lower Leg) Notes silver cell, drawtex, abd, 4 layer Electronic Signature(s) GEORGI, HAKIMIAN ([id number]) Signed: 01/06/2020 4:27:26 PM By: Montey Hora Entered By: Montey Hora on 01/06/2020 12:56:09 Girten, [name] ([id number]) -------------------------------------------------------------------------------- Wound Assessment Details Patient Name: [name], [name]. Date of Service: 01/06/2020 12:45 PM Medical Record  Number: [id number] Patient Account Number: [id number] Date of Birth/Sex: Jul 24, 1946 (74 y.o. F) Treating RN: Montey Hora Primary Care Andretta Ergle: [name] Other Clinician: Referring Revis Whalin: [name] Treating Lekeya Rollings/Extender: Tito Dine in Treatment: 49 Wound Status Wound Number: 9 Primary Venous Leg Ulcer Etiology: Wound Location: Left Lower Leg - Lateral, Posterior Wound Open Wounding Event: Gradually Appeared Status: Date Acquired: 07/08/2019 Comorbid Cataracts, Asthma, Sleep Apnea, Deep Vein Thrombosis, Weeks Of Treatment: 26 History: Hypertension, Peripheral Venous Disease, Osteoarthritis, Clustered Wound: No Received Chemotherapy, Received Radiation Photos Wound Measurements Length: (cm) 1 Width: (cm) 1 Depth: (cm) 0.1 Area: (cm) 0.785 Volume: (cm) 0.079 % Reduction in Area: -38.9% % Reduction in Volume: -38.6% Epithelialization: None Wound Description Classification: Full Thickness Without Exposed Support Struc Wound Margin: Flat and Intact Exudate Amount: Large Exudate Type: Serosanguineous Exudate Color: red, brown tures Foul Odor After Cleansing: No Slough/Fibrino Yes Wound Bed Granulation Amount: Small (1-33%) Exposed Structure Granulation Quality: Pink Fascia Exposed: No Necrotic Amount: Large (67-100%) Fat Layer (Subcutaneous Tissue) Exposed: Yes Necrotic Quality: Adherent Slough Tendon Exposed: No Muscle Exposed: No Joint Exposed: No Bone Exposed: No Treatment Notes Wound #9 (Left, Lateral, Posterior Lower Leg) Notes silver cell, drawtex, abd, 4 layer Electronic Signature(s) REYNOLDS, GLIDDEN ([id number]) Signed: 01/06/2020 4:27:26 PM By: Montey Hora Entered By: Montey Hora on 01/06/2020 12:56:28 Kedzierski, [name] ([id number]) -------------------------------------------------------------------------------- Vitals Details Patient Name: RONIYAH, BRESEE. Date of Service: 01/06/2020 12:45 PM Medical Record Number:  [id number] Patient Account Number: [id number] Date of Birth/Sex: [birth year] (74 y.o. F) Treating RN: Montey Hora Primary Care Ricka Westra: [name] Other Clinician: Referring Leodan Bolyard: [name] Treating Stellarose Cerny/Extender: Tito Dine in Treatment: 56 Vital Signs Time Taken: 12:47 Temperature (F): 98.7 Height (in): 63 Pulse (bpm): 83 Weight (lbs): 224.7 Respiratory Rate (breaths/min): 16 Body Mass Index (BMI): 39.8 Blood Pressure (mmHg): 157/70 Reference Range: 80 - 120 mg / dl Electronic Signature(s) Signed: 01/06/2020 4:27:26 PM By: Montey Hora Entered By: Montey Hora on 01/06/2020 12:47:34

## 2020-01-06 NOTE — Telephone Encounter (Signed)
Will see today.  

## 2020-01-06 NOTE — Assessment & Plan Note (Addendum)
Appreciate wound care On pentoxifylline 1 mo course

## 2020-01-06 NOTE — Assessment & Plan Note (Signed)
Describes looser stools with increased abdominal gurgling without abdominal discomfort/pain or nausea. Reassuring exam, no red flags. Temporally related to starting pentoxifylline. She thinks she will be completing pentoxifylline course in a week. Reviewed diarrhea isn't listed as a possible side effect however she may well be having GI symptoms as side effect to med. I asked her to contact me if ongoing symptoms to consider further evaluation including labs. She agrees with plan.

## 2020-01-06 NOTE — Progress Notes (Signed)
This visit was conducted in person.  BP 130/84 (BP Location: Left Arm, Patient Position: Sitting, Cuff Size: Large)   Pulse 84   Temp 97.9 F (36.6 C) (Temporal)   Ht 5\' 2"  (1.575 m)   Wt 222 lb 5 oz (100.8 kg)   SpO2 96%   BMI 40.66 kg/m    CC: GI problems Subjective:    Patient ID: [name], female    DOB: [birth year], 74 y.o.   MRN: [id number]  HPI: [name] is a 74 y.o. female presenting on 01/06/2020 for GI Problem (C/o diarrhea and gurgling in abd.  Denies any nausea/vomiting.  Sxs started within last month.  Thought it was due to meds she was taking. )   1 mo h/o loose stools after eating. Has had near accidents. Notes increasing gurgling. Feels some abdominal bloating. This may have started with pentoxifylline. Notes more trouble with dairy products - nausea. No significant GERD symptoms, no belching, no gassiness, no nausea/vomiting. No abdominal pain. No weight loss, dysphagia, early satiety. No cough or chest pain.   Chronic venous insufficiency issues s/p stent placement L (07/2019).  Took plavix for 4 months.  Pentoxifylline recently started by wound clinic.  L leg not doing well. Upcoming appt Friday with Dr Donzetta Matters.   Now back on meloxicam PRN and pepcid 10mg  nightly.  Now off nortritpyline.  Requests meloxicam refill.      Relevant past medical, surgical, family and social history reviewed and updated as indicated. Interim medical history since our last visit reviewed. Allergies and medications reviewed and updated. Outpatient Medications Prior to Visit  Medication Sig Dispense Refill  . acetaminophen (TYLENOL ARTHRITIS PAIN) 650 MG CR tablet Take 1,300 mg by mouth every 8 (eight) hours as needed for pain.     Marland Kitchen albuterol (PROVENTIL) (2.5 MG/3ML) 0.083% nebulizer solution Take 3 mLs (2.5 mg total) by nebulization every 6 (six) hours as needed for wheezing or shortness of breath. 150 mL 1  . albuterol (VENTOLIN HFA) 108 (90 Base) MCG/ACT inhaler INHALE 2  PUFFS BY MOUTH EVERY 6 HORUS AS NEEDED FOR WHEEZING/SHORTNESS OF BREATH 8.5 g 3  . aspirin EC 81 MG tablet Take 81 mg by mouth at bedtime. Melanoma prevention    . azelastine (ASTELIN) 0.1 % nasal spray Place 1 spray into both nostrils 2 (two) times daily. Use in each nostril as directed 30 mL 3  . furosemide (LASIX) 40 MG tablet TAKE 1 TABLET BY MOUTH EVERY DAY 30 tablet 5  . loratadine (CLARITIN) 10 MG tablet Take 10 mg by mouth daily.    Marland Kitchen losartan (COZAAR) 25 MG tablet TAKE 2 TABLETS BY MOUTH EVERY DAY 60 tablet 5  . montelukast (SINGULAIR) 10 MG tablet TAKE 1 TABLET BY MOUTH EVERY DAY 30 tablet 3  . Multiple Vitamin (MULTIVITAMIN WITH MINERALS) TABS tablet Take 1 tablet by mouth daily.    . pentoxifylline (TRENTAL) 400 MG CR tablet Take 400 mg by mouth 3 (three) times daily.    . Polyvinyl Alcohol-Povidone (TEARS PLUS OP) Place 1 drop into both eyes daily as needed (dry eyes/ redness/ burning).    . potassium chloride SA (KLOR-CON) 20 MEQ tablet Take 1 tablet (20 mEq total) by mouth 2 (two) times daily. 180 tablet 3  . PRESCRIPTION MEDICATION CPAP    . Probiotic Product (PROBIOTIC DAILY PO) Take 1 tablet by mouth daily.     Marland Kitchen triamcinolone (NASACORT ALLERGY 24HR) 55 MCG/ACT AERO nasal inhaler Place 2 sprays into the  nose daily. In each nostril    . UNABLE TO FIND Take by mouth.    . Vitamin D, Ergocalciferol, (DRISDOL) 1.25 MG (50000 UNIT) CAPS capsule TAKE 1 CAPSULE (50,000 UNITS TOTAL) BY MOUTH EVERY 7 (SEVEN) DAYS. 12 capsule 3  . famotidine (PEPCID) 10 MG tablet Take 10 mg by mouth daily.    . meloxicam (MOBIC) 15 MG tablet Take 15 mg by mouth daily. As needed  3  . clopidogrel (PLAVIX) 75 MG tablet TAKE 1 TABLET BY MOUTH EVERY DAY 30 tablet 3  . nortriptyline (PAMELOR) 25 MG capsule Take 1 capsule (25 mg total) by mouth at bedtime. 30 capsule 3   No facility-administered medications prior to visit.     Per HPI unless specifically indicated in ROS section below Review of  Systems Objective:    BP 130/84 (BP Location: Left Arm, Patient Position: Sitting, Cuff Size: Large)   Pulse 84   Temp 97.9 F (36.6 C) (Temporal)   Ht 5\' 2"  (1.575 m)   Wt 222 lb 5 oz (100.8 kg)   SpO2 96%   BMI 40.66 kg/m   Wt Readings from Last 3 Encounters:  01/06/20 222 lb 5 oz (100.8 kg)  10/14/19 219 lb 5 oz (99.5 kg)  10/09/19 219 lb (99.3 kg)    Physical Exam Vitals and nursing note reviewed.  Constitutional:      Appearance: Normal appearance. She is not ill-appearing.  Cardiovascular:     Rate and Rhythm: Normal rate and regular rhythm.     Pulses: Normal pulses.     Heart sounds: Normal heart sounds. No murmur.  Pulmonary:     Effort: Pulmonary effort is normal. No respiratory distress.     Breath sounds: Normal breath sounds. No wheezing, rhonchi or rales.  Abdominal:     General: Abdomen is flat. Bowel sounds are normal. There is no distension.     Palpations: Abdomen is soft. There is no mass.     Tenderness: There is no abdominal tenderness. There is no guarding or rebound.     Hernia: No hernia is present.  Neurological:     Mental Status: She is alert.  Psychiatric:        Mood and Affect: Mood normal.        Behavior: Behavior normal.       Results for orders placed or performed in visit on 10/07/19  TSH  Result Value Ref Range   TSH 3.29 0.35 - 4.50 uIU/mL  vit d  Result Value Ref Range   VITD 40.79 30.00 - 100.00 ng/mL  Basic metabolic panel  Result Value Ref Range   Sodium 142 135 - 145 mEq/L   Potassium 4.2 3.5 - 5.1 mEq/L   Chloride 104 96 - 112 mEq/L   CO2 30 19 - 32 mEq/L   Glucose, Bld 104 (H) 70 - 99 mg/dL   BUN 13 6 - 23 mg/dL   Creatinine, Ser 0.71 0.40 - 1.20 mg/dL   GFR 80.64 >60.00 mL/min   Calcium 9.7 8.4 - 10.5 mg/dL   Lab Results  Component Value Date   WBC 6.1 08/14/2018   HGB 12.9 08/03/2019   HCT 38.0 08/03/2019   MCV 96.1 08/14/2018   PLT 197 08/14/2018    Lab Results  Component Value Date   ALT 22  08/14/2018   AST 15 08/14/2018   ALKPHOS 60 08/14/2018   BILITOT 0.6 08/14/2018    Assessment & Plan:  This visit occurred during the SARS-CoV-2  public health emergency.  Safety protocols were in place, including screening questions prior to the visit, additional usage of staff PPE, and extensive cleaning of exam room while observing appropriate contact time as indicated for disinfecting solutions.   Problem List Items Addressed This Visit    Venous stasis ulcer of left lower leg with edema of left lower leg (HCC)    Appreciate wound care On pentoxifylline 1 mo course      Loose stools - Primary    Describes looser stools with increased abdominal gurgling without abdominal discomfort/pain or nausea. Reassuring exam, no red flags. Temporally related to starting pentoxifylline. She thinks she will be completing pentoxifylline course in a week. Reviewed diarrhea isn't listed as a possible side effect however she may well be having GI symptoms as side effect to med. I asked her to contact me if ongoing symptoms to consider further evaluation including labs. She agrees with plan.       Chronic venous insufficiency       Meds ordered this encounter  Medications  . meloxicam (MOBIC) 15 MG tablet    Sig: Take 1 tablet (15 mg total) by mouth daily as needed for pain.    Dispense:  30 tablet    Refill:  1  . famotidine (PEPCID) 20 MG tablet    Sig: Take 1 tablet (20 mg total) by mouth at bedtime.   No orders of the defined types were placed in this encounter.   Patient Instructions  Reassuring exam today.  Monitor symptoms for now.  Let me know if ongoing trouble after finishing pentoxifylline.    Follow up plan: No follow-ups on file.  [name], MD

## 2020-01-06 NOTE — Progress Notes (Signed)
KATELEEN, WILDS ([id number]) Visit Report for 01/06/2020 HPI Details Patient Name: [name], [name]. Date of Service: 01/06/2020 12:45 PM Medical Record Number: [id number] Patient Account Number: [id number] Date of Birth/Sex: 06-26-1946 (74 y.o. F) Treating RN: Cornell Barman Primary Care Provider: [name] Other Clinician: Referring Provider: [name] Treating Provider/Extender: Tito Dine in Treatment: 73 History of Present Illness HPI Description: Pleasant 74 year old with history of chronic venous insufficiency. No diabetes or peripheral vascular disease. Left ABI 1.29. Questionable history of left lower extremity DVT. She developed a recurrent ulceration on her left lateral calf in December 2015, which she attributes to poor diet and subsequent lower extremity edema. She underwent endovenous laser ablation of her left greater saphenous vein in 2010. She underwent laser ablation of accessory branch of left GSV in April 2016 by Dr. Kellie Simmering at [facility]. She was previously wearing Unna boots, which she tolerated well. Tolerating 2 layer compression and cadexomer iodine. She returns to clinic for follow-up and is without new complaints. She denies any significant pain at this time. She reports persistent pain with pressure. No claudication or ischemic rest pain. No fever or chills. No drainage. READMISSION 11/13/16; this is a 74 year old woman who is not a diabetic. She is here for a review of a painful area on her left medial lower extremity. I note that she was seen here previously last year for wound I believe to be in the same area. At that time she had undergone previously a left greater saphenous vein ablation by Dr. Kellie Simmering and she had a ablation of the anterior accessory branch of the left greater saphenous vein in March 2016. Seeing that the wound actually closed over. In reviewing the history with her today the ulcer in this area has been recurrent. She describes a  biopsy of this area in 2009 that only showed stasis physiology. 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 Do not see that she has ever had arterial studies in the left leg. She tells me she has a follow-up with Dr. Kellie Simmering in roughly 10 days In any case she developed the reopening of this area roughly a month ago. On the background of this she describes rapidly increasing edema which has responded to Lasix 40 mg and metolazone 2.5 mg as well as the patient's lymph massage. She has been told she has both venous insufficiency and lymphedema but she cannot tolerate compression stockings 11/28/16; the patient saw Dr. Kellie Simmering recently. Per the patient he did arterial Dopplers in the office that did not show evidence of arterial insufficiency, per the patient he stated "treat this like an ordinary venous ulcer". She also saw her dermatologist Dr. Ronnald Ramp who felt that this was more of a vascular ulcer. In general things are improving although she arrives today with increasing bilateral lower extremity edema with weeping a deeper fluid through the wound on the left medial leg compatible with some degree of lymphedema 12/04/16; the patient's wound is fully epithelialized but I don't think fully healed. We will do another week of depression with Promogran and TCA however I suspect we'll be able to discharge her next week. This is a very unusual-looking wound which was initially a figure-of-eight type wound lying on its side surrounded by petechial like hemorrhage. She  has had venous ablation on this side. She apparently does not have an arterial issue per Dr. Kellie Simmering. She saw her dermatologist thought it was "vascular". Patient is definitely going to need ongoing compression and I talked about this  with her today she will go to elastic therapy after she leaves here next week 12/11/16; the patient's wound is not completely closed today. She has surrounding scar tissue and in further discussion with the patient it would appear that she had ulcers in this area in 2009 for a prolonged period of time ultimately requiring a punch biopsy of this area that only showed venous insufficiency. I did not previously pickup on this part of the history from the patient. 12/18/16; the patient's wound is completely epithelialized. There is no open area here. She has significant bilateral venous insufficiency with secondary lymphedema to a mild-to-moderate degree she does not have compression stockings.. She did not say anything to me when I was in the room, she told our intake nurse that she was still having pain in this area. This isn't unusual recurrent small open area. She is going to go to elastic therapy to obtain compression stockings. 12/25/16; the patient's wound is fully epithelialized. There is no open area here. The patient describes some continued episodic discomfort in this area medial left calf. However everything looks fine and healed here. She is been to elastic therapy and caught herself 15-20 mmHg stockings, they apparently were having trouble getting 20-30 mm stockings in her size 01/22/17; this is a patient we discharged from the clinic a month ago. She has a recurrent open wound on her medial left calf. She had 15 mm support stockings. I told her I thought she needed 20-30 mm compression stockings. She tells me that she has been ill with hospitalization secondary to asthma and is been found to have severe hypokalemia likely secondary to a combination of Lasix and metolazone. This morning she noted blistering and leaking fluid on the posterior part of her left leg. She called our intake nurse urgently and we was saw her this afternoon. She has not had any real discomfort here. I don't know that  she's been wearing any stockings on this leg for at least 2-3 days. ABIs in this clinic were 1.21 on the right and 1.3 on the left. She is previously seen vascular surgery who does not think that there is a peripheral arterial issue. 01/30/17; Patient arrives with no open wound on the left leg. She has been to elastic therapy and obtained 20-69mmhg below knee stockings and she has one on the right leg today. READMISSION 02/19/18; this Michalak is a now 74 year old patient we've had in this clinic perhaps 3 times before. I had last looked at her from January 07 December 2016 with an area on the medial left leg. We discharged her on 12/25/16 however she had to be readmitted on 01/22/17 with a recurrence. I have in my notes that we discharged her on 20-30 mm stockings although she tells me she was only wearing support hose because she cannot get stockings on DOMONIC, BALKE. ([id number]) predominantly related to her cervical spine surgery/issues. She has had previous ablations done by vein and vascular in [place] including a great saphenous vein ablation on the left with an anterior accessory branch ablation I think both of these were in 2016. On one of the previous visit she had a biopsy noted 2009 that was negative. She is not felt to have an arterial issue. She is not  a diabetic. She does have a history of obstructive sleep apnea hypertension asthma as well as chronic venous insufficiency and lymphedema. On this occasion she noted 2 dry scaly patch on her left leg. She tried to put lotion on this it didn't really help. There were 2 open areas.the patient has been seeing her primary physician from 02/05/18 through 02/14/18. She had Unna boots applied. The superior wound now on the lateral left leg has closed but she's had one wound that remains open on the lateral left leg. This is not the same spot as we dealt with in 2018. ABIs in this clinic were 1.3 bilaterally 02/26/18; patient has a small wound on the  left lateral calf. Dimensions are down. She has chronic venous insufficiency and lymphedema. 03/05/18; small open area on the left lateral calf. Dimensions are down. Tightly adherent necrotic debris over the surface of the wound which was difficult to remove. Also the dressing [over collagen] stuck to the wound surface. This was removed with some difficulty as well. Change the primary dressing to Hydrofera Blue ready 03/12/18; small open area on the left lateral calf. Comes in with tightly adherent surface eschar as well as some adherent Hydrofera Blue. 03/19/18; open area on the left lateral calf. Again adherent surface eschar as well as some adherent Hydrofera Blue nonviable subcutaneous tissue. She complained of pain all week even with the reduction from 4-3 layer compression I put on last week. Also she had an increase in her ankle and calf measurements probably related to the same thing. 03/26/18; open area on the left lateral calf. A very small open area remains here. We used silver alginate starting last week as the Hydrofera Blue seem to stick to the wound bed. In using 4-layer compression 04/02/18; the open area in the left lateral calf at some adherent slough which I removed there is no open area here. We are able to transition her into her own compression stocking. Truthfully I think this is probably his support hose. However this does not maintain skin integrity will be limited. She cannot put over the toe compression stockings on because of neck problems hand problems etc. She is allergic to the lining layer of juxta lites. We might be forced to use extremitease stocking should this fail READMIT 11/24/2018 Patient is now a 74 year old woman who is not a diabetic. She has been in this clinic on at least 3 previous occasions largely with recurrent wounds on her left leg secondary to chronic venous insufficiency with secondary lymphedema. Her situation is complicated by inability to get stockings  on and an allergy to neoprene which is apparently a component and at least juxta lites and other stockings. As a result she really has not been wearing any stockings on her legs. She tells [place] that roughly 2 or 3 weeks ago she started noticing a stinging sensation just above her ankle on the left medial aspect. She has been diagnosed with pseudogout and she wondered whether this was what she was experiencing. She tried to dress this with something she bought at the store however subsequently it pulled skin off and now she has an open wound that is not improving. She has been using Vaseline gauze with a cover bandage. She saw her primary doctor last week who put an Haematologist on her. ABIs in this clinic was 1.03 on the left 2/12; the area is on the left medial ankle. Odd-looking wound with what looks to be surface epithelialization but a multitude of  small petechial openings. This clearly not closed yet. We have been using silver alginate under 3 layer compression with TCA 2/19; the wound area did not look quite as good this week. Necrotic debris over the majority of the wound surface which required debridement. She continues to have a multitude of what looked to be small petechial openings. She reminds [place] that she had a biopsy on this initially during her first outbreak in 2015 in [place] dermatology. She expresses concern about this being a possible melanoma. 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 I assured her that this does not look anything like melanoma. Except for the petechial reaction it does look like a venous insufficiency area and she certainly has evidence of this on both sides 2/26; a difficult area on the left medial ankle. The patient clearly has chronic venous hypertension with some degree of lymphedema. The odd thing about the area is the small petechial hemorrhages. I am not really sure how  to explain this. This was present last time and this is not a compression injury. We have been using Hydrofera Blue which I changed to last week 3/4; still using Hydrofera Blue. Aggressive debridement today. She does not have known arterial issues. She has seen Dr. Kellie Simmering at [facility] vein and vascular and and has an ablation on the left. [Anterior accessory branch of the greater saphenous]. From what I remember they did not feel she had an arterial issue. The patient has had this area biopsied in 2009 at [facility] dermatology and by her recollection they said this was "stasis". She is also follow-up with dermatology locally who thought that this was more of a vascular issue 3/11; using Hydrofera Blue. Aggressive debridement today. She does not have an arterial issue. We are using 3 layer compression although we may need to go to 4. The patient has been in for multiple changes to her wrap since I last saw her a week ago. She says that the area was leaking. I do not have too much more information on what was found 01/19/19 on evaluation today patient was actually being seen for a nurse visit when unfortunately she had the area on her left lateral lower extremity as well as weeping from the right lower extremity that became apparent. Therefore we did end up actually seeing her for a full visit with myself. She is having some pain at this site as well but fortunately nothing too significant at this point. No fevers, chills, nausea, or vomiting noted at this time. 3/18-Patient is back to the clinic with the left leg venous leg ulcer, the ulcer is larger in size, has a surface that is densely adherent with fibrinous tissue, the Hydrofera Blue was used but is densely adherent and there was difficulty in removing it. The right lower extremity was also wrapped for weeping edema. Patient has a new area over the left lateral foot above the malleolus that is small and appears to have no debris with intact  surrounding skin. Patient is on increased dose of Lasix also as a means to edema management 3/25; the patient has a nonhealing venous ulcer on the medial left leg and last week developed a smaller area on the lateral left calf. We have been using Hydrofera Blue with a contact layer. 4/1; no major change in these wounds areas. Left medial and more recently left lateral calf. I tried Iodoflex last week to aid in debridement  she did not tolerate this. She stated her pain was terrible all week. She took the top layer of the 4 layer compression off. 4/8; the patient actually looks somewhat better in terms of her more prominent left lateral calf wound. There is some healthy looking tissue here. She is still complaining of a lot of discomfort. 4/15; patient in a lot of pain secondary to sciatica. She is on a prednisone taper prescribed by her primary physician. She has the 2 areas one on the [place], ROSEMARIA ROSENMAN. ([id number]) left medial and more recently a smaller area on the left lateral calf. Both of these just above the malleoli 4/22; her back pain is better but she still states she is very uncomfortable and now feels she is intolerant to the [employer]. No real change in the wounds we have been using Sorbact. She has been previously intolerant to Iodoflex. There is not a lot of option about what we can use to debride this wound under compression that she no doubt needs. sHe states Ultram no longer works for her pain 4/29; no major change in the wounds slightly increased depth. Surface on the original medial wound perhaps somewhat improved however the more recent area on the lateral left ankle is 100% covered in very adherent debris we have been using Sorbact. She tolerates 4 layer compression well and her edema control is a lot better. She has not had to come in for a nurse check 5/6; no major change in the condition of the wounds. She did consent to debridement today which was done with some difficulty.  Continuing Sorbact. She did not tolerate Iodoflex. She was in for a check of her compression the day after we wrapped her last week this was adjusted but nothing much was found 5/13; no major change in the condition or area of the wounds. I was able to get a fairly aggressive debridement done on the lateral left leg wound. Even using Sorbact under compression. She came back in on Friday to have the wrap changed. She says she felt uncomfortable on the lateral aspect of her ankle. She has a long history of chronic venous insufficiency including previous ablation surgery on this side. 5/20-Patient returns for wounds on left leg with both wounds covered in slough, with the lateral leg wound larger in size, she has been in 3 layer compression and felt more comfortable, she describes pain in ankle, in leg and pins and needles in foot, and is about to try Pamelor for this 6/3; wounds on the left lateral and left medial leg. The area medially which is the most recent of the 2 seems to have had the largest increase in dimensions. We have been using Sorbac to try and debride the surface. She has been to see orthopedics they apparently did a plain x-ray that was indeterminant. Diagnosed her with neuropathy and they have ordered an MRI to determine if there is underlying osteomyelitis. This was not high on my thought list but I suppose it is prudent. We have advised her to make an appointment with vein and vascular in [place]. She has a history of a left greater saphenous and accessory vein ablations I wonder if there is anything else that can be done from a surgical point of view to help in these difficult refractory wounds. We have previously healed this wound on one occasion but it keeps on reopening [medial side] 6/10; deep tissue culture I did last week I think on the left medial wound showed  both moderate E. coli and moderate staph aureus [MSSA]. She is going to require antibiotics and I have chosen  Augmentin. We have been using Sorbact and we have made better looking wound surface on both sides but certainly no improvement in wound area. She was back in last Friday apparently for a dressing changes the wrap was hurting her outer left ankle. She has not managed to get a hold of vein and vascular in [place]. We are going to have to make her that appointment 6/17; patient is tolerating the Augmentin. She had an MRI that I think was ordered by orthopedic surgeon this did not show osteomyelitis or an abscess did suggest cellulitis. We have been using Sorbact to the lateral and medial ankles. We have been trying to arrange a follow-up appointment with vein and vascular in [place] or did her original ablations. We apparently an area sent the request to vein and vascular in [facility] 6/24; patient has completed the Augmentin. We do not yet have a vein and vascular appointment in [name]. I am not sure what the issue is here we have asked her to call tomorrow. We are using Sorbact. Making some improvements and especially the medial wound. Both surfaces however look better medial and lateral. 7/1; the patient has been in contact with vein and vascular in [place] but has not yet received an appointment. Using Sorbact we have gradually improve the wound surface with no improvement in surface area. She is approved for Apligraf but the wound surface still is not completely viable. She has not had to come in for a dressing change 7/8; the patient has an appointment with vein and vascular on 7/31 which is a Friday afternoon. She is concerned about getting back here for [place] to dress her wounds. I think it is important to have them goal for her venous reflux/history of ablations etc. to see if anything else can be done. She apparently tested positive for 1 of the blood tests with regards to lupus and saw a rheumatologist. He has raised the issue of vasculitis again. I have had this thought in the  past however the evidence seems overwhelming that this is a venous reflux etiology. If the rheumatologist tells me there is clinical and laboratory investigation is positive for lupus I will rethink this. 7/15; the patient's wound surfaces are quite a bit better. The medial area which was her original wound now has no depth although the lateral wound which was the more recent area actually appears larger. Both with viable surfaces which is indeed better. Using Sorbact. I wanted to use Apligraf on her however there is the issue of the vein and vascular appointment on 7/31 at 2:00 in the afternoon which would not allow her to get back to be rewrapped and they would no doubt remove the graft 7/22; the patient's wound surfaces have moderate amount of debris although generally look better. The lateral one is larger with 2 small satellite areas superiorly. We are waiting for her vein and vascular appointment on 7/31. She has been approved for Apligraf which I would like to use after th 7/29; wound surfaces have improved no debridement is required we have been using Sorbact. She sees vein and vascular on Friday with this so question of whether anything can be done to lessen the likelihood of recurrence and/or speed the healing of these areas. She is already had previous ablations. She no doubt has severe venous hypertension 8/5-Patient returns at 1 week, she was in [employer]  for 3 days by her podiatrist, we have been using so backed to the wound, she has increased pain in both the wounds on the left lower leg especially the more distal one on the lateral aspect 8/12-Patient returns at 1 week and she is agreeable to having debridement in both wounds on her left leg today. We have been using Sorbact, and vascular studies were reviewed at last visit 8/19; the patient arrives with her wounds fairly clean and no debridement is required. We have used Sorbact which is really done a nice job in cleaning up these  very difficult wound surfaces. The patient saw Dr. Donzetta Matters of vascular surgery on 7/31. He did not feel that there was an arterial component. He felt that her treated greater saphenous vein is adequately addressed and that the small saphenous vein did not appear to be involved significantly. She was also noted to have deep venous reflux which is not treatable. Dr. Donzetta Matters mentioned the possibility of a central obstructive component leading to reflux and he offered her central venography. She wanted to discuss this or think about it. I have urged her to go ahead with this. She has had recurrent difficult wounds in these areas which do heal but after months in the clinic. If there is anything that can be done to reduce the likelihood of this I think it is worth it. 9/2 she is still working towards getting follow-up with Dr. Donzetta Matters to schedule her CT. Things are quite a bit worse venography. I put Apligraf on 2 weeks ago on both wounds on the medial and lateral part of her left lower leg. She arrives in clinic today with 3 superficial additional wounds above the area laterally and one below the wound medially. She describes a lot of discomfort. I think these are probably wrapped injuries. Does not look like she has cellulitis. 07/20/2019 on evaluation today patient appears to be doing somewhat poorly in regard to her lower extremity ulcers. She in fact showed signs of erythema in fact we may even be dealing with an infection at this time. Unfortunately I am unsure if this is just infection or if indeed there may be some allergic reaction that occurred as a result of the Apligraf application. With that being said that would be unusual but nonetheless not impossible in this patient is one who is unfortunately allergic to quite a bit. Currently we have been using the Sorbact which seems to do as well as anything for her. I do think we may want to obtain a culture today to see if there is anything showing up there  that may need to be addressed. MARGRIT, WIGLEY ([id number]) 9/16; noted that last week the wounds look worse in 1 week follow-up of the Apligraf. Using Sorbact as of 2 days ago. She arrives with copious amounts of drainage and new skin breakdown on the back of the left calf. The wounds arm more substantial bilaterally. There is a fair amount of swelling in the left calf no overt DVT there is edema present I think in the left greater than right thigh. She is supposed to go on 9/28 for CT venography. The wounds on the medial and lateral calf are worse and she has new skin breakdown posteriorly at least new for me. This is almost developing into a circumferential wound area The Apligraf was taken off last week which I agree with things are not going in the right direction a culture was done we do not have that  back yet. She is on Augmentin that she started 2 days ago 9/23; dressing was changed by her nurses on Monday. In general there is no improvement in the wound areas although the area looks less angry than last week. She did get Augmentin for MSSA cultured on the 14th. She still appears to have too much swelling in the left leg even with 3 layer compression 9/30; the patient underwent her procedure on 9/28 by Dr. Donzetta Matters at [facility]. She was discovered to have the common iliac vein measuring 12.2 mm but at the level of L4-L5 measured 3 mm. After stenting it measured 10 mm. 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 Rouleaux flow in the common femoral and femoral vein was observed much improved after stenting. We are using silver alginate to the wounds on the medial and lateral ankle on the left. 4 layer compression 10/7; the patient had fluid swelling around her knee and 4 layer compression. At the advice of vein and vascular this was reduced to 3 layer which she is tolerating better. We have been using silver alginate under 3 layer compression since last  Friday 10/14; arrives with the areas on the left ankle looking a lot better. Inflammation in the area also a lot better. She came in for a nurse check on 10/9 10/21; continued nice improvement. Slight improvements in surface area of both the medial and lateral wounds on the left. A lot of the satellite lesions in the weeping erythema around these from stasis dermatitis is resolved. We have been using silver alginate 10/28; general improvement in the entire wound areas although not a lot of change in dimensions the wound certainly looks better. There is a lot less in terms of venous inflammation. Continue silver alginate this week however look towards Hydrofera Blue next week 11/4; very adherent debris on the medial wound left wound is not as bad. We have been using silver alginate. Change to [facility] today 11/11; very adherent debris on both wound areas. She went to vein and vascular last week and follow-up they put in [place] boot on this today. He says the [facility] was adherent. Wound is definitely not as good as last week. Especially on the left there the satellite lesions look more prominent 11/18; absolutely no better. erythema on lateral aspect with tenderness. 09/30/2019 on evaluation today patient appears to actually be doing better. Dr. Dellia Nims did put her on doxycycline last week which I do believe has helped her at this point. Fortunately there is no signs of active infection at this time. No fevers, chills, nausea, vomiting, or diarrhea. I do believe he may want extend the doxycycline for 7 additional days just to ensure everything does completely cleared up the patient is in agreement with that plan. Otherwise she is going require some sharp debridement today 12/2; patient is completing a 2-week course of doxycycline. I gave her this empirically for inflammation as well as infection when I last saw her 2 weeks ago. All of this seems to be better. She is using silver alginate she  has the area on the medial aspect of the larger area laterally and the 2 small satellite regions laterally above the major wound. 12/9; the patient's wound on the left medial and left lateral calf look really quite good. We have been using silver alginate. She saw vein and vascular in follow-up on 10/09/2019. She has had a previous left greater saphenous vein ablation by Dr. Oscar La in 2016.  More recently she underwent a left common iliac vein stent by Dr. 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 The swelling is improved and certainly the wounds have improved. The patient shows [place] today area on the right medial calf there is almost no wound but leaking lymphedema. She says she start this started 3 or 4 days ago. She did not traumatize it. It is not painful. She does not wear compression on that side 12/16; the patient continues to do well laterally. Medially still requiring debridement. The area on the right calf did not materialize to anything and is not currently open. We wrapped this last time. She has support stockings for that leg although I am not sure they are going to provide adequate compression 12/23; the lateral wound looks stable. Medially still requiring debridement for tightly adherent fibrinous debris. We've been using silver alginate. Surface area not any different 12/30; neither wound is any better with regards to surface and the area on the left lateral is larger. I been using silver alginate to the left lateral which look quite good last week and Sorbact to the left medial 11/11/2019. Lateral wound area actually looks better and somewhat smaller. Medial still requires a very aggressive debridement today. We have been using Sorbact on both wound areas 1/13; not much better still adherent debris bilaterally. I been using Sorbact. She has severe venous hypertension. Probably some degree of dermal fibrosis distally. I wonder whether tighter compression might help and I am going  to try that today. We also need to work on the bioburden 1/20; using Sorbact. She has severe venous hypertension status post stent placement for pelvic vein compression. We applied gentamicin last time to see if we could reduce bioburden I had some discussion with her today about the use of pentoxifylline. This is occasionally used in this setting for wounds with refractory venous insufficiency. However this interacts with Plavix. She tells me that she was put on this after stent placement for 3 months. She will call Dr. Claretha Cooper office to discuss 1/27; we are using gentamicin under Sorbact. 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 She has a stent. Wound medially is measuring smaller this week. Laterally measuring slightly larger although she has some satellite lesions superiorly 2/3; gentamicin under Sorbact under 4-layer compression. 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 She has a stent on Plavix. Her wounds are measuring smaller this week. More substantially laterally where there is a satellite lesion superiorly. 2/10; gentamicin under Sorbac. 4-layer compression. Patient communicated with Dr. Donzetta Matters at vein and vascular in [place]. He is okay with the patient coming off Plavix I will therefore start her on pentoxifylline for a 1 month trial. In general her wounds look better today. I had some concerns about swelling in the left thigh however she measures 61.5 on the right and 63 on the mid thigh which does not suggest there is any difficulty. The patient is not describing any pain. 2/17; gentamicin under Sorbac 4-layer compression. She has been on pentoxifylline for 1 week and complains of loose stool. No nausea she is eating and drinking well 2/24; the patient apparently came in 2 days ago for a nurse visit when her wrap fell down. Both areas look a little worse this week macerated medially and satellite lesions laterally. Change to  silver alginate today DAYLYNN, BERGSTRAND ([id number]) 3/3; wounds are larger today especially medially. She also has more swelling in her foot lower leg and I even  noted some swelling in her posterior thigh which is tender. I wonder about the patency of her stent. Fortuitously she sees Dr. Claretha Cooper group on Friday Electronic Signature(s) Signed: 01/06/2020 4:59:37 PM By: Linton Ham MD Entered By: Linton Ham on 01/06/2020 13:13:25 KHARI, GILLOOLY ([id number]) -------------------------------------------------------------------------------- Physical Exam Details Patient Name: SAGAL, FARFAN. Date of Service: 01/06/2020 12:45 PM Medical Record Number: [id number] Patient Account Number: [id number] Date of Birth/Sex: 11/08/1945 (74 y.o. F) Treating RN: Cornell Barman Primary Care Provider: [name] Other Clinician: Referring Provider: [name] Treating Provider/Extender: Tito Dine in Treatment: 109 Constitutional Patient is hypertensive.. Pulse regular and within target range for patient.Marland Kitchen Respirations regular, non-labored and within target range.. Temperature is normal and within the target range for the patient.Marland Kitchen appears in no distress. Cardiovascular Pedal pulses are palpable. Edema present in both extremities. I think this is worse. There is more edema in her calf and edema in her right posterior thigh which is pitting. Notes Wound exam; both sides on the medial and lateral look worse today larger especially medially. Both areas look somewhat more irritated than I would like. Electronic Signature(s) Signed: 01/06/2020 4:59:37 PM By: Linton Ham MD Entered By: Linton Ham on 01/06/2020 13:14:50 Antonopoulos, [name] -------------------------------------------------------------------------------- Physician Orders Details Patient Name: HIMANI, WHITTY. Date of Service: 01/06/2020 12:45 PM Medical Record Number: [id number] Patient Account Number:  [id number] Date of Birth/Sex: 1946-06-11 (74 y.o. F) Treating RN: Cornell Barman Primary Care Provider: [name] Other Clinician: Referring Provider: [name] Treating Provider/Extender: Tito Dine in Treatment: 70 Verbal / Phone Orders: No Diagnosis Coding Wound Cleansing Wound #5 Left,Medial Lower Leg o Cleanse wound with mild soap and water Wound #6 Left,Lateral Lower Leg o Cleanse wound with mild soap and water Wound #9 Left,Lateral,Posterior Lower Leg o Cleanse wound with mild soap and water Anesthetic (add to Medication List) Wound #5 Left,Medial Lower Leg o Topical Lidocaine 4% cream applied to wound bed prior to debridement (In Clinic Only). Wound #6 Left,Lateral Lower Leg o Topical Lidocaine 4% cream applied to wound bed prior to debridement (In Clinic Only). Wound #9 Left,Lateral,Posterior Lower Leg o Topical Lidocaine 4% cream applied to wound bed prior to debridement (In Clinic Only). Primary Wound Dressing Wound #5 Left,Medial Lower Leg o Alginate Wound #6 Left,Lateral Lower Leg o Alginate Wound #9 Left,Lateral,Posterior Lower Leg o Alginate Secondary Dressing Wound #5 Left,Medial Lower Leg o Drawtex Wound #6 Left,Lateral Lower Leg o Drawtex Wound #9 Left,Lateral,Posterior Lower Leg o Drawtex Dressing Change Frequency Wound #5 Left,Medial Lower Leg o Change dressing every week o Other: - Friday after vascular appointment Wound #6 Left,Lateral Lower Leg o Change dressing every week o Other: - Friday after vascular appointment Wound #9 Left,Lateral,Posterior Lower Leg o Change dressing every week o Other: - Friday after vascular appointment MARIALIZ, GONZELEZ ([id number]) Follow-up Appointments Wound #5 Left,Medial Lower Leg o Return Appointment in 1 week. o Nurse Visit as needed - Friday after vascular appointment Wound #6 Left,Lateral Lower Leg o Return Appointment in 1 week. o Nurse  Visit as needed - Friday after vascular appointment Wound #9 Left,Lateral,Posterior Lower Leg o Return Appointment in 1 week. o Nurse Visit as needed - Friday after vascular appointment Edema Control Wound #5 Left,Medial Lower Leg o 4-Layer Compression System - Left Lower Extremity. o Elevate legs to the level of the heart and pump ankles as often as possible Wound #6 Left,Lateral Lower Leg o 4-Layer Compression System - Left Lower Extremity. o Elevate legs  to the level of the heart and pump ankles as often as possible Wound #9 Left,Lateral,Posterior Lower Leg o 4-Layer Compression System - Left Lower Extremity. o Elevate legs to the level of the heart and pump ankles as often as possible Medications-please add to medication list. Wound #5 Left,Medial Lower Leg o Other: - Continue Pentoxifylline Notes Appointment with Dr. Donzetta Matters on Friday. Electronic Signature(s) Signed: 01/06/2020 4:40:39 PM By: [name], [name], RN, CWS, [name], [name] Signed: 01/06/2020 4:59:37 PM By: Linton Ham MD Entered By: [name], [name], RN, CWS, Kim on 01/06/2020 13:09:34 JANEE, JACOWAY ([id number]) -------------------------------------------------------------------------------- Problem List Details Patient Name: ROSARIE, MATUSKA. Date of Service: 01/06/2020 12:45 PM Medical Record Number: [id number] Patient Account Number: [id number] Date of Birth/Sex: [birth year] (74 y.o. 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 Date of Service: 01/06/2020 12:45 PM Medical Record Number: [id number] Patient Account Number: [id number] Date of Birth/Sex: December 05, 1945 (74 y.o. F) Treating RN: Cornell Barman Primary Care Provider: [name] Other Clinician: Referring Provider: [name] Treating Provider/Extender: Tito Dine in Treatment: 29 Subjective History of Present Illness (HPI) Pleasant 74 year old with history of chronic venous insufficiency. No diabetes or peripheral vascular disease. Left ABI 1.29. Questionable history of left lower extremity DVT. She developed a recurrent ulceration on her left lateral calf in December 2015, which she attributes to poor diet and subsequent lower extremity edema. She underwent endovenous laser ablation of her left greater saphenous vein in 2010. She underwent laser ablation of accessory branch of left GSV in April 2016 by Dr. Kellie Simmering at [facility]. She was previously wearing Unna boots, which she tolerated well. Tolerating 2 layer compression and cadexomer iodine. She returns to clinic for follow-up and is without new complaints. She denies any significant pain at this time. She reports persistent pain with pressure. No claudication or ischemic rest pain. No fever or chills. No drainage. READMISSION 11/13/16; this is a 74 year old woman who is not  a diabetic. She is here for a review of a painful area on her left medial lower extremity. I note that she was seen here previously last year for wound I believe to be in the same area. At that time she had undergone previously a left greater saphenous vein ablation by Dr. Kellie Simmering and she had a ablation of the anterior accessory branch of the left greater saphenous vein in March 2016. Seeing that the wound actually closed over. In reviewing the history with her today the ulcer in this area has been recurrent. She describes a biopsy of this area in 2009 that only showed stasis physiology. She also has a history of today malignant melanoma in the right shoulder for which  she follows with Dr. 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 Do not see that she has ever had arterial studies in the left leg. She tells me she has a follow-up with Dr. Kellie Simmering in roughly 10 days In any case she developed the reopening of this area roughly a month ago. On the background of this she describes rapidly increasing edema which has responded to Lasix 40 mg and metolazone 2.5 mg as well as the patient's lymph massage. She has been told she has both venous insufficiency and lymphedema but she cannot tolerate compression stockings 11/28/16; the patient saw Dr. Kellie Simmering recently. Per the patient he did arterial Dopplers in the office that did not show evidence of arterial insufficiency, per the patient he stated "treat this like an ordinary venous ulcer". She also saw her dermatologist Dr. Ronnald Ramp who felt that this was more of a vascular ulcer. In general things are improving although she arrives today with increasing bilateral lower extremity edema with weeping a deeper fluid through the wound on the left medial leg compatible with some degree of lymphedema 12/04/16; the patient's wound is fully epithelialized but I  don't think fully healed. We will do another week of depression with Promogran and TCA however I suspect we'll be able to discharge her next week. This is a very unusual-looking wound which was initially a figure-of-eight type wound lying on its side surrounded by petechial like hemorrhage. She has had venous ablation on this side. She apparently does not have an arterial issue per Dr. Kellie Simmering. She saw her dermatologist thought it was "vascular". Patient is definitely going to need ongoing compression and I talked about this with her today she will go to elastic therapy after she leaves here next week 12/11/16; the patient's wound is not completely closed today. She has surrounding scar tissue and in further discussion with the patient it would appear that she had ulcers in this area in 2009 for a prolonged period of time ultimately requiring a punch biopsy of this area that only showed venous insufficiency. I did not previously pickup on this part of the history from the patient. 12/18/16; the patient's wound is completely epithelialized. There is no open area here. She has significant bilateral venous insufficiency with secondary lymphedema to a mild-to-moderate degree she does not have compression stockings.. She did not say anything to me when I was in the room, she told our intake nurse that she was still having pain in this area. This isn't unusual recurrent small open area. She is going to go to elastic therapy to obtain compression stockings. 12/25/16; the patient's wound is fully epithelialized. There is no open area here. The patient describes some continued episodic discomfort in this area medial left calf. However everything looks fine and healed here. She is been to elastic therapy and caught herself 15-20 mmHg stockings, they apparently were having trouble getting 20-30 mm stockings in her size 01/22/17; this is a patient we discharged from the clinic a month ago. She has a recurrent open  wound on her medial left calf. She had 15 mm support stockings. I told her I thought she needed 20-30 mm compression stockings. She tells me that she has been ill with hospitalization secondary to asthma and is been found to have severe hypokalemia likely secondary to a combination of Lasix and metolazone. This morning she noted blistering and leaking  fluid on the posterior part of her left leg. She called our intake nurse urgently and we was saw her this afternoon. She has not had any real discomfort here. I don't know that she's been wearing any stockings on this leg for at least 2-3 days. ABIs in this clinic were 1.21 on the right and 1.3 on the left. She is previously seen vascular surgery who does not think that there is a peripheral arterial issue. 01/30/17; Patient arrives with no open wound on the left leg. She has been to elastic therapy and obtained 20-4mmhg below knee stockings and she has one on the right leg today. READMISSION 02/19/18; this Bolger is a now 74 year old patient we've had in this clinic perhaps 3 times before. I had last looked at her from January 07 December 2016 with an area on the medial left leg. We discharged her on 12/25/16 however she had to be readmitted on 01/22/17 with a recurrence. I have in my notes that we discharged her on 20-30 mm stockings although she tells me she was only wearing support hose because she cannot get stockings on predominantly related to her cervical spine surgery/issues. She has had previous ablations done by vein and vascular in [place] including a great saphenous vein ablation on the left with an anterior accessory branch ablation I think both of these were in 2016. On one of the previous visit she had a biopsy noted 2009 that was negative. She is not felt to have an arterial issue. She is not a diabetic. She does have a history of obstructive sleep apnea hypertension asthma as well as chronic venous insufficiency and lymphedema. CHANNIN, MERKL ([id number]) On this occasion she noted 2 dry scaly patch on her left leg. She tried to put lotion on this it didn't really help. There were 2 open areas.the patient has been seeing her primary physician from 02/05/18 through 02/14/18. She had Unna boots applied. The superior wound now on the lateral left leg has closed but she's had one wound that remains open on the lateral left leg. This is not the same spot as we dealt with in 2018. ABIs in this clinic were 1.3 bilaterally 02/26/18; patient has a small wound on the left lateral calf. Dimensions are down. She has chronic venous insufficiency and lymphedema. 03/05/18; small open area on the left lateral calf. Dimensions are down. Tightly adherent necrotic debris over the surface of the wound which was difficult to remove. Also the dressing [over collagen] stuck to the wound surface. This was removed with some difficulty as well. Change the primary dressing to Hydrofera Blue ready 03/12/18; small open area on the left lateral calf. Comes in with tightly adherent surface eschar as well as some adherent Hydrofera Blue. 03/19/18; open area on the left lateral calf. Again adherent surface eschar as well as some adherent Hydrofera Blue nonviable subcutaneous tissue. She complained of pain all week even with the reduction from 4-3 layer compression I put on last week. Also she had an increase in her ankle and calf measurements probably related to the same thing. 03/26/18; open area on the left lateral calf. A very small open area remains here. We used silver alginate starting last week as the Hydrofera Blue seem to stick to the wound bed. In using 4-layer compression 04/02/18; the open area in the left lateral calf at some adherent slough which I removed there is no open area here. We are able to transition her into her own compression stocking.  Truthfully I think this is probably his support hose. However this does not maintain skin integrity will be  limited. She cannot put over the toe compression stockings on because of neck problems hand problems etc. She is allergic to the lining layer of juxta lites. We might be forced to use extremitease stocking should this fail READMIT 11/24/2018 Patient is now a 74 year old woman who is not a diabetic. She has been in this clinic on at least 3 previous occasions largely with recurrent wounds on her left leg secondary to chronic venous insufficiency with secondary lymphedema. Her situation is complicated by inability to get stockings on and an allergy to neoprene which is apparently a component and at least juxta lites and other stockings. As a result she really has not been wearing any stockings on her legs. She tells [place] that roughly 2 or 3 weeks ago she started noticing a stinging sensation just above her ankle on the left medial aspect. She has been diagnosed with pseudogout and she wondered whether this was what she was experiencing. She tried to dress this with something she bought at the store however subsequently it pulled skin off and now she has an open wound that is not improving. She has been using Vaseline gauze with a cover bandage. She saw her primary doctor last week who put an Haematologist on her. ABIs in this clinic was 1.03 on the left 2/12; the area is on the left medial ankle. Odd-looking wound with what looks to be surface epithelialization but a multitude of small petechial openings. This clearly not closed yet. We have been using silver alginate under 3 layer compression with TCA 2/19; the wound area did not look quite as good this week. Necrotic debris over the majority of the wound surface which required debridement. She continues to have a multitude of what looked to be small petechial openings. She reminds [place] that she had a biopsy on this initially during her first outbreak in 2015 in [place] [facility]. She expresses concern about this being a possible melanoma. 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 I assured her that this does not look anything like melanoma. Except for the petechial reaction it does look like a venous insufficiency area and she certainly has evidence of this on both sides 2/26; a difficult area on the left medial ankle. The patient clearly has chronic venous hypertension with some degree of lymphedema. The odd thing about the area is the small petechial hemorrhages. I am not really sure how to explain this. This was present last time and this is not a compression injury. We have been using Hydrofera Blue which I changed to last week 3/4; still using Hydrofera Blue. Aggressive debridement today. She does not have known arterial issues. She has seen Dr. Kellie Simmering at [facility] vein and vascular and and has an ablation on the left. [Anterior accessory branch of the greater saphenous]. From what I remember they did not feel she had an arterial issue. The patient has had this area biopsied in 2009 at [facility] dermatology and by her recollection they said this was "stasis". She is also follow-up with dermatology locally who thought that this was more of a vascular issue 3/11; using Hydrofera Blue. Aggressive debridement today. She does not have an arterial issue. We are using 3 layer compression although we may need to go to 4. The patient has been in for multiple changes  to her wrap since I last saw her a week ago. She says that the area was leaking. I do not have too much more information on what was found 01/19/19 on evaluation today patient was actually being seen for a nurse visit when unfortunately she had the area on her left lateral lower extremity as well as weeping from the right lower extremity that became apparent. Therefore we did end up actually seeing her for a full visit with myself. She is having some pain at this site as well but fortunately nothing too  significant at this point. No fevers, chills, nausea, or vomiting noted at this time. 3/18-Patient is back to the clinic with the left leg venous leg ulcer, the ulcer is larger in size, has a surface that is densely adherent with fibrinous tissue, the Hydrofera Blue was used but is densely adherent and there was difficulty in removing it. The right lower extremity was also wrapped for weeping edema. Patient has a new area over the left lateral foot above the malleolus that is small and appears to have no debris with intact surrounding skin. Patient is on increased dose of Lasix also as a means to edema management 3/25; the patient has a nonhealing venous ulcer on the medial left leg and last week developed a smaller area on the lateral left calf. We have been using Hydrofera Blue with a contact layer. 4/1; no major change in these wounds areas. Left medial and more recently left lateral calf. I tried Iodoflex last week to aid in debridement she did not tolerate this. She stated her pain was terrible all week. She took the top layer of the 4 layer compression off. 4/8; the patient actually looks somewhat better in terms of her more prominent left lateral calf wound. There is some healthy looking tissue here. She is still complaining of a lot of discomfort. 4/15; patient in a lot of pain secondary to sciatica. She is on a prednisone taper prescribed by her primary physician. She has the 2 areas one on the left medial and more recently a smaller area on the left lateral calf. Both of these just above the malleoli 4/22; her back pain is better but she still states she is very uncomfortable and now feels she is intolerant to the [employer]. No real change in the wounds we have been using Sorbact. She has been previously intolerant to Iodoflex. There is not a lot of option about what we can use to debride this wound under compression that she no doubt needs. sHe states Ultram no longer works for her  pain ANDRAYA, KIZER ([id number]) 4/29; no major change in the wounds slightly increased depth. Surface on the original medial wound perhaps somewhat improved however the more recent area on the lateral left ankle is 100% covered in very adherent debris we have been using Sorbact. She tolerates 4 layer compression well and her edema control is a lot better. She has not had to come in for a nurse check 5/6; no major change in the condition of the wounds. She did consent to debridement today which was done with some difficulty. Continuing Sorbact. She did not tolerate Iodoflex. She was in for a check of her compression the day after we wrapped her last week this was adjusted but nothing much was found 5/13; no major change in the condition or area of the wounds. I was able to get a fairly aggressive debridement done on the lateral left leg wound. Even using  Sorbact under compression. She came back in on Friday to have the wrap changed. She says she felt uncomfortable on the lateral aspect of her ankle. She has a long history of chronic venous insufficiency including previous ablation surgery on this side. 5/20-Patient returns for wounds on left leg with both wounds covered in slough, with the lateral leg wound larger in size, she has been in 3 layer compression and felt more comfortable, she describes pain in ankle, in leg and pins and needles in foot, and is about to try Pamelor for this 6/3; wounds on the left lateral and left medial leg. The area medially which is the most recent of the 2 seems to have had the largest increase in dimensions. We have been using Sorbac to try and debride the surface. She has been to see orthopedics they apparently did a plain x-ray that was indeterminant. Diagnosed her with neuropathy and they have ordered an MRI to determine if there is underlying osteomyelitis. This was not high on my thought list but I suppose it is prudent. We have advised her to make an  appointment with vein and vascular in [place]. She has a history of a left greater saphenous and accessory vein ablations I wonder if there is anything else that can be done from a surgical point of view to help in these difficult refractory wounds. We have previously healed this wound on one occasion but it keeps on reopening [medial side] 6/10; deep tissue culture I did last week I think on the left medial wound showed both moderate E. coli and moderate staph aureus [MSSA]. She is going to require antibiotics and I have chosen Augmentin. We have been using Sorbact and we have made better looking wound surface on both sides but certainly no improvement in wound area. She was back in last Friday apparently for a dressing changes the wrap was hurting her outer left ankle. She has not managed to get a hold of vein and vascular in [place]. We are going to have to make her that appointment 6/17; patient is tolerating the Augmentin. She had an MRI that I think was ordered by orthopedic surgeon this did not show osteomyelitis or an abscess did suggest cellulitis. We have been using Sorbact to the lateral and medial ankles. We have been trying to arrange a follow-up appointment with vein and vascular in [place] or did her original ablations. We apparently an area sent the request to vein and vascular in [facility] 6/24; patient has completed the Augmentin. We do not yet have a vein and vascular appointment in [place]. I am not sure what the issue is here we have asked her to call tomorrow. We are using Sorbact. Making some improvements and especially the medial wound. Both surfaces however look better medial and lateral. 7/1; the patient has been in contact with vein and vascular in [place] but has not yet received an appointment. Using Sorbact we have gradually improve the wound surface with no improvement in surface area. She is approved for Apligraf but the wound surface still is not  completely viable. She has not had to come in for a dressing change 7/8; the patient has an appointment with vein and vascular on 7/31 which is a Friday afternoon. She is concerned about getting back here for [place] to dress her wounds. I think it is important to have them goal for her venous reflux/history of ablations etc. to see if anything else can be done. She apparently tested positive for 1  of the blood tests with regards to lupus and saw a rheumatologist. He has raised the issue of vasculitis again. I have had this thought in the past however the evidence seems overwhelming that this is a venous reflux etiology. If the rheumatologist tells me there is clinical and laboratory investigation is positive for lupus I will rethink this. 7/15; the patient's wound surfaces are quite a bit better. The medial area which was her original wound now has no depth although the lateral wound which was the more recent area actually appears larger. Both with viable surfaces which is indeed better. Using Sorbact. I wanted to use Apligraf on her however there is the issue of the vein and vascular appointment on 7/31 at 2:00 in the afternoon which would not allow her to get back to be rewrapped and they would no doubt remove the graft 7/22; the patient's wound surfaces have moderate amount of debris although generally look better. The lateral one is larger with 2 small satellite areas superiorly. We are waiting for her vein and vascular appointment on 7/31. She has been approved for Apligraf which I would like to use after th 7/29; wound surfaces have improved no debridement is required we have been using Sorbact. She sees vein and vascular on Friday with this so question of whether anything can be done to lessen the likelihood of recurrence and/or speed the healing of these areas. She is already had previous ablations. She no doubt has severe venous hypertension 8/5-Patient returns at 1 week, she was in [place]  for 3 days by her podiatrist, we have been using so backed to the wound, she has increased pain in both the wounds on the left lower leg especially the more distal one on the lateral aspect 8/12-Patient returns at 1 week and she is agreeable to having debridement in both wounds on her left leg today. We have been using Sorbact, and vascular studies were reviewed at last visit 8/19; the patient arrives with her wounds fairly clean and no debridement is required. We have used Sorbact which is really done a nice job in cleaning up these very difficult wound surfaces. The patient saw Dr. Donzetta Matters of vascular surgery on 7/31. He did not feel that there was an arterial component. He felt that her treated greater saphenous vein is adequately addressed and that the small saphenous vein did not appear to be involved significantly. She was also noted to have deep venous reflux which is not treatable. Dr. Donzetta Matters mentioned the possibility of a central obstructive component leading to reflux and he offered her central venography. She wanted to discuss this or think about it. I have urged her to go ahead with this. She has had recurrent difficult wounds in these areas which do heal but after months in the clinic. If there is anything that can be done to reduce the likelihood of this I think it is worth it. 9/2 she is still working towards getting follow-up with Dr. Donzetta Matters to schedule her CT. Things are quite a bit worse venography. I put Apligraf on 2 weeks ago on both wounds on the medial and lateral part of her left lower leg. She arrives in clinic today with 3 superficial additional wounds above the area laterally and one below the wound medially. She describes a lot of discomfort. I think these are probably wrapped injuries. Does not look like she has cellulitis. 07/20/2019 on evaluation today patient appears to be doing somewhat poorly in regard to her lower  extremity ulcers. She in fact showed signs of erythema in  fact we may even be dealing with an infection at this time. Unfortunately I am unsure if this is just infection or if indeed there may be some allergic reaction that occurred as a result of the Apligraf application. With that being said that would be unusual but nonetheless not impossible in this patient is one who is unfortunately allergic to quite a bit. Currently we have been using the Sorbact which seems to do as well as anything for her. I do think we may want to obtain a culture today to see if there is anything showing up there that may need to be addressed. 9/16; noted that last week the wounds look worse in 1 week follow-up of the Apligraf. Using Sorbact as of 2 days ago. She arrives with copious amounts of drainage and new skin breakdown on the back of the left calf. The wounds arm more substantial bilaterally. There is a fair amount of swelling in the left calf no overt DVT there is edema present I think in the left greater than right thigh. She is supposed to go on 9/28 for CT TIMMY, DELAHOUSSAYE. ([id number]) venography. The wounds on the medial and lateral calf are worse and she has new skin breakdown posteriorly at least new for me. This is almost developing into a circumferential wound area The Apligraf was taken off last week which I agree with things are not going in the right direction a culture was done we do not have that back yet. She is on Augmentin that she started 2 days ago 9/23; dressing was changed by her nurses on Monday. In general there is no improvement in the wound areas although the area looks less angry than last week. She did get Augmentin for MSSA cultured on the 14th. She still appears to have too much swelling in the left leg even with 3 layer compression 9/30; the patient underwent her procedure on 9/28 by Dr. Donzetta Matters at vascular and vein specialist. She was discovered to have the common iliac vein measuring 12.2 mm but at the level of L4-L5 measured 3 mm. After  stenting it measured 10 mm. 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 Rouleaux flow in the common femoral and femoral vein was observed much improved after stenting. We are using silver alginate to the wounds on the medial and lateral ankle on the left. 4 layer compression 10/7; the patient had fluid swelling around her knee and 4 layer compression. At the advice of vein and vascular this was reduced to 3 layer which she is tolerating better. We have been using silver alginate under 3 layer compression since last Friday 10/14; arrives with the areas on the left ankle looking a lot better. Inflammation in the area also a lot better. She came in for a nurse check on 10/9 10/21; continued nice improvement. Slight improvements in surface area of both the medial and lateral wounds on the left. A lot of the satellite lesions in the weeping erythema around these from stasis dermatitis is resolved. We have been using silver alginate 10/28; general improvement in the entire wound areas although not a lot of change in dimensions the wound certainly looks better. There is a lot less in terms of venous inflammation. Continue silver alginate this week however look towards Hydrofera Blue next week 11/4; very adherent debris on the medial wound left wound is not as bad. We have been using silver alginate.  Change to [facility] today 11/11; very adherent debris on both wound areas. She went to vein and vascular last week and follow-up they put in Robinson boot on this today. He says the [facility] was adherent. Wound is definitely not as good as last week. Especially on the left there the satellite lesions look more prominent 11/18; absolutely no better. erythema on lateral aspect with tenderness. 09/30/2019 on evaluation today patient appears to actually be doing better. Dr. Dellia Nims did put her on doxycycline last week which I do believe has helped her at this point. Fortunately there is no signs  of active infection at this time. No fevers, chills, nausea, vomiting, or diarrhea. I do believe he may want extend the doxycycline for 7 additional days just to ensure everything does completely cleared up the patient is in agreement with that plan. Otherwise she is going require some sharp debridement today 12/2; patient is completing a 2-week course of doxycycline. I gave her this empirically for inflammation as well as infection when I last saw her 2 weeks ago. All of this seems to be better. She is using silver alginate she has the area on the medial aspect of the larger area laterally and the 2 small satellite regions laterally above the major wound. 12/9; the patient's wound on the left medial and left lateral calf look really quite good. We have been using silver alginate. She saw vein and vascular in follow-up on 10/09/2019. She has had a previous left greater saphenous vein ablation by Dr. Oscar La in 2016. More recently she underwent a left common iliac vein stent by Dr. 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 The swelling is improved and certainly the wounds have improved. The patient shows [place] today area on the right medial calf there is almost no wound but leaking lymphedema. She says she start this started 3 or 4 days ago. She did not traumatize it. It is not painful. She does not wear compression on that side 12/16; the patient continues to do well laterally. Medially still requiring debridement. The area on the right calf did not materialize to anything and is not currently open. We wrapped this last time. She has support stockings for that leg although I am not sure they are going to provide adequate compression 12/23; the lateral wound looks stable. Medially still requiring debridement for tightly adherent fibrinous debris. We've been using silver alginate. Surface area not any different 12/30; neither wound is any better with regards to surface and the area on the left  lateral is larger. I been using silver alginate to the left lateral which look quite good last week and Sorbact to the left medial 11/11/2019. Lateral wound area actually looks better and somewhat smaller. Medial still requires a very aggressive debridement today. We have been using Sorbact on both wound areas 1/13; not much better still adherent debris bilaterally. I been using Sorbact. She has severe venous hypertension. Probably some degree of dermal fibrosis distally. I wonder whether tighter compression might help and I am going to try that today. We also need to work on the bioburden 1/20; using Sorbact. She has severe venous hypertension status post stent placement for pelvic vein compression. We applied gentamicin last time to see if we could reduce bioburden I had some discussion with her today about the use of pentoxifylline. This is occasionally used in this setting for wounds with refractory venous insufficiency. However this interacts with Plavix. She tells me that she was put  on this after stent placement for 3 months. She will call Dr. Claretha Cooper office to discuss 1/27; we are using gentamicin under Sorbact. 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 She has a stent. Wound medially is measuring smaller this week. Laterally measuring slightly larger although she has some satellite lesions superiorly 2/3; gentamicin under Sorbact under 4-layer compression. 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 She has a stent on Plavix. Her wounds are measuring smaller this week. More substantially laterally where there is a satellite lesion superiorly. 2/10; gentamicin under Sorbac. 4-layer compression. Patient communicated with Dr. Donzetta Matters at vein and vascular in [place]. He is okay with the patient coming off Plavix I will therefore start her on pentoxifylline for a 1 month trial. In general her wounds look better today. I had some concerns about  swelling in the left thigh however she measures 61.5 on the right and 63 on the mid thigh which does not suggest there is any difficulty. The patient is not describing any pain. 2/17; gentamicin under Sorbac 4-layer compression. She has been on pentoxifylline for 1 week and complains of loose stool. No nausea she is eating and drinking well 2/24; the patient apparently came in 2 days ago for a nurse visit when her wrap fell down. Both areas look a little worse this week macerated medially and satellite lesions laterally. Change to silver alginate today 3/3; wounds are larger today especially medially. She also has more swelling in her foot lower leg and I even noted some swelling in her posterior thigh which is tender. I wonder about the patency of her stent. Fortuitously she sees Dr. Claretha Cooper group on Friday LEANORE, PELLICER ([id number]) Objective Constitutional Patient is hypertensive.. Pulse regular and within target range for patient.Marland Kitchen Respirations regular, non-labored and within target range.. Temperature is normal and within the target range for the patient.Marland Kitchen appears in no distress. Vitals Time Taken: 12:47 PM, Height: 63 in, Weight: 224.7 lbs, BMI: 39.8, Temperature: 98.7 F, Pulse: 83 bpm, Respiratory Rate: 16 breaths/min, Blood Pressure: 157/70 mmHg. Cardiovascular Pedal pulses are palpable. Edema present in both extremities. I think this is worse. There is more edema in her calf and edema in her right posterior thigh which is pitting. General Notes: Wound exam; both sides on the medial and lateral look worse today larger especially medially. Both areas look somewhat more irritated than I would like. Integumentary (Hair, Skin) Wound #5 status is Open. Original cause of wound was Gradually Appeared. The wound is located on the Left,Medial Lower Leg. The wound measures 4.5cm length x 2.5cm width x 0.2cm depth; 8.836cm^2 area and 1.767cm^3 volume. There is Fat Layer (Subcutaneous Tissue)  Exposed exposed. There is no tunneling or undermining noted. There is a large amount of serosanguineous drainage noted. The wound margin is indistinct and nonvisible. There is large (67-100%) pale granulation within the wound bed. There is a small (1-33%) amount of necrotic tissue within the wound bed including Adherent Slough. Wound #6 status is Open. Original cause of wound was Gradually Appeared. The wound is located on the Left,Lateral Lower Leg. The wound measures 5cm length x 4.5cm width x 0.2cm depth; 17.671cm^2 area and 3.534cm^3 volume. There is Fat Layer (Subcutaneous Tissue) Exposed exposed. There is a large amount of serosanguineous drainage noted. The wound margin is flat and intact. There is medium (34-66%) pink granulation within the wound bed. There is a medium (34-66%) amount of necrotic tissue within the wound bed including Adherent Slough. Wound #9 status  is Open. Original cause of wound was Gradually Appeared. The wound is located on the Left,Lateral,Posterior Lower Leg. The wound measures 1cm length x 1cm width x 0.1cm depth; 0.785cm^2 area and 0.079cm^3 volume. There is Fat Layer (Subcutaneous Tissue) Exposed exposed. There is a large amount of serosanguineous drainage noted. The wound margin is flat and intact. There is small (1-33%) pink granulation within the wound bed. There is a large (67-100%) amount of necrotic tissue within the wound bed including Adherent Slough. 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 There was a Four Layer Compression Therapy Procedure with a  pre-treatment ABI of 1 by Cornell Barman, RN. Post procedure Diagnosis Wound #9: Same as Pre-Procedure [name], [name] ([id number]) Plan Wound Cleansing: Wound #5 Left,Medial Lower Leg: Cleanse wound with mild soap and water Wound #6 Left,Lateral Lower Leg: Cleanse wound with mild soap and water Wound #9 Left,Lateral,Posterior Lower Leg: Cleanse wound with mild soap and water Anesthetic (add to Medication List): Wound #5 Left,Medial Lower Leg: Topical Lidocaine 4% cream applied to wound bed prior to debridement (In Clinic Only). Wound #6 Left,Lateral Lower Leg: Topical Lidocaine 4% cream applied to wound bed prior to debridement (In Clinic Only). Wound #9 Left,Lateral,Posterior Lower Leg: Topical Lidocaine 4% cream applied to wound bed prior to debridement (In Clinic Only). Primary Wound Dressing: Wound #5 Left,Medial Lower Leg: Alginate Wound #6 Left,Lateral Lower Leg: Alginate Wound #9 Left,Lateral,Posterior Lower Leg: Alginate Secondary Dressing: Wound #5 Left,Medial Lower Leg: Drawtex Wound #6 Left,Lateral Lower Leg: Drawtex Wound #9 Left,Lateral,Posterior Lower Leg: Drawtex Dressing Change Frequency: Wound #5 Left,Medial Lower Leg: Change dressing every week Other: - Friday after vascular appointment Wound #6 Left,Lateral Lower Leg: Change dressing every week Other: - Friday after vascular appointment Wound #9 Left,Lateral,Posterior Lower Leg: Change dressing every week Other: - Friday after vascular appointment Follow-up Appointments: Wound #5 Left,Medial Lower Leg: Return Appointment in 1 week. Nurse Visit as needed - Friday after vascular appointment Wound #6 Left,Lateral Lower Leg: Return Appointment in 1 week. Nurse Visit as needed - Friday after vascular appointment Wound #9 Left,Lateral,Posterior Lower Leg: Return Appointment in 1 week. Nurse Visit as needed - Friday after vascular appointment Edema Control: Wound #5 Left,Medial Lower Leg: 4-Layer  Compression System - Left Lower Extremity. Elevate legs to the level of the heart and pump ankles as often as possible Wound #6 Left,Lateral Lower Leg: 4-Layer Compression System - Left Lower Extremity. Elevate legs to the level of the heart and pump ankles as often as possible Wound #9 Left,Lateral,Posterior Lower Leg: 4-Layer Compression System - Left Lower Extremity. Elevate legs to the level of the heart and pump ankles as often as possible Medications-please add to medication list.: Wound #5 Left,Medial Lower Leg: Other: - Continue Pentoxifylline General Notes: Appointment with Dr. Donzetta Matters on Friday. SABRA, PLANTZ ([id number]) 1. I change the dressing to calcium alginate 1 to keep the areas as dry as I can 2. I am going to reach out to Dr. Donzetta Matters vis--vis my worry about the swelling in the left leg especially and including the posterior left thigh. I will have to check in [place] link to see who she is actually seen 3.  4-layer compression were going to have her come back on Friday after she sees vein and vascular to change the dressing I think that is going to be helpful Electronic Signature(s) Signed: 01/06/2020 4:59:37 PM By: Linton Ham MD Entered By: Linton Ham on 01/06/2020 13:16:42 Wenke, [name] -------------------------------------------------------------------------------- SuperBill Details Patient Name: JALACIA, COWLING. Date of Service: 01/06/2020 Medical Record Number: [id number] Patient Account Number: [id number] Date of Birth/Sex: 09-10-1946 (74 y.o. F) Treating RN: Cornell Barman Primary Care Provider: [name] Other Clinician: Referring Provider: [name] Treating Provider/Extender: Tito Dine in Treatment: 12 Diagnosis Coding ICD-10 Codes Code Description [phone] Non-pressure chronic ulcer of left calf limited to breakdown of skin I87.332 Chronic venous hypertension (idiopathic) with ulcer and inflammation of left  lower extremity I89.0 Lymphedema, not elsewhere classified I82.592 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 Facility Procedures CPT4 Code: [id number] Description: (Facility Use Only) [id number] - [facility] LWR LT LEG Modifier: Quantity: 1 Physician Procedures CPT4 [id number] Description: [id number] - WC PHYS LEVEL 3 - EST PT Modifier: Quantity: 1 CPT4 Code: Description: ICD-10 Diagnosis Description L97.221 Non-pressure chronic ulcer of left calf limited to breakdown of skin I87.332 Chronic venous hypertension (idiopathic) with ulcer and inflammation of left l I89.0 Lymphedema, not elsewhere classified Modifier: ower extremity Quantity: Electronic Signature(s) Signed: 01/06/2020 4:59:37 PM By: Linton Ham MD Entered By: Linton Ham on 01/06/2020 13:17:12

## 2020-01-07 NOTE — Progress Notes (Signed)
HPI: [name] is a 74 y.o. (04-04-1946) female who presents for follow up.  She was last seen in our office 10/09/2019 and pt has hx of  left greater saphenous vein ablation by Dr. Kellie Simmering in 2016.  Most recently she underwent left common iliac vein stent by Dr. 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  At her last visit, she felt the swelling in her leg had drastically improved and her venous ulcerations had also improved.  She was elevating her legs as much as possible and continued some form of compression daily with wraps from the wound care center.   She is over all very happy with the decrease in her wounds and edema on the left LE.  She reports left posterior thigh pain to palpation without known injury when she last saw Dr. Asa Saunas.  She states when she was started on Plavix she stopped her Asprin and now she has tried a new medication trental.  She has discontinued both Asprin and Plavix at this time.    She denise symptoms of claudication, rest pain or non healing wounds on her feet.    Past Medical History:  Diagnosis Date  . Allergy   . Anesthesia complication    trouble waking up 2/2 CPAP  . Arthritis   . Asthma in adult   . Cataract 2019   left eye resolved with surgery  . Cervical spondylosis 2012   Jacelyn Grip, [place])  . Choroidal nevus of left eye 03/22/2016  . Chronic venous insufficiency 2016   severe reflux with painful varicosities sees [facility]  . Clotting disorder ([place])    due to vein ablation   . Complication of anesthesia    difficulty coming out due to sleep apnea per pt  . Difficult intubation    PATIENT DENIES   . Diverticulosis    per ct scans 09-2016 and 01-2017  . DVT (deep venous thrombosis) ([place]) 2011   small, developed after venous ablation  . DVT (deep venous thrombosis) ([place])   . Family history of adverse reaction to anesthesia    O2 levels drop upon waking   . Hearing loss sensory, bilateral 2013   mod-severe high freq sensorineural ([facility])  . Hiatal hernia     09-2016 ct scan [facility] at cancer center   . History of kidney stones 1980s  . History of radiation therapy 07/19/11-08/25/2011   RIGHT AXILLARY REGION/METASTATIC  . Hypertension   . Melanoma ([place]) 06/29/10   MALIGNANT MELANOMA R SHOULDER/SUPRASCAPULAR BACK s/p interferon chemo and XRT  . Neuromuscular disorder ([facility])    muscle spasms  . OSA (obstructive sleep apnea)    on CPAP  . Pneumonia    2001  . RLS (restless legs syndrome)   . Seasonal allergies   . Sleep apnea    wears cpap  . Tinnitus   . Unspecified vitamin D deficiency 03/18/2013  . Varicose veins of left lower extremity     Past Surgical History:  Procedure Laterality Date  . ABI  2016   WNL  . ANTERIOR CERVICAL DECOMP/DISCECTOMY FUSION N/A 08/22/2015   ANTERIOR CERVICAL DECOMPRESSION FUSION C3/4 - interbody graft and anterior plate; Kristeen Miss, MD  . ANTERIOR CERVICAL DECOMP/DISCECTOMY FUSION N/A 07/02/2016   Procedure: Cervical four- five Cervical five- six Cervical six- seven Anterior cervical decompression/diskectomy/fusion;  Surgeon: Kristeen Miss, MD;  Location: [facility];  Service: Neurosurgery;  Laterality: N/A;  C4-5 C5-6 C6-7 Anterior cervical decompression/diskectomy/fusion  . BREAST BIOPSY  Left 2013   BENIGN  . BREAST SURGERY Right 1990   BX   . CARDIOVASCULAR STRESS TEST  2010   normal stress test, EF 66%  . CATARACT EXTRACTION W/ INTRAOCULAR LENS IMPLANT Left 2019   Dr. Kathrin Penner  . COLONOSCOPY  03/2005   benign polyp, int hem, rpt 5 yrs ([place], [place])  . COLONOSCOPY WITH PROPOFOL N/A 01/06/2018   diverticulosis, decreased sphincter tone, no f/u needed [name], [place]ke Corin, MD)  . DEEP AXILLARY SENTINEL NODE BIOPSY / EXCISION  2012   RIGHT  . DEXA  06/2016   WNL  . DILATION AND CURETTAGE OF UTERUS  2010  . ENDOVENOUS ABLATION SAPHENOUS VEIN W/ LASER Left 2010   [facility]  . ENDOVENOUS ABLATION SAPHENOUS VEIN W/ LASER Left 2016   accessory branch [facility] Kellie Simmering)  .  ESOPHAGOGASTRODUODENOSCOPY  2006   mod chronic carditis, no H pylori ([place], [place])  . INTRAVASCULAR ULTRASOUND/IVUS N/A 08/03/2019   Waynetta Sandy, [place]   left  . LOWER EXTREMITY VENOGRAPHY Left 08/03/2019   Waynetta Sandy, MD  . LUMBAR LAMINECTOMY  2001   L4/5  . MELANOMA EXCISION  2011   Right shoulder, with sentinel lymph node biopsy  . PERIPHERAL VASCULAR INTERVENTION Left 08/03/2019   Stent of left common iliac vein with 14 x 60 mm Vici Donzetta Matters, [place] Dom, MD)  . PORT-A-CATH REMOVAL  12/05/2011   Procedure: REMOVAL PORT-A-CATH;  Surgeon: Rolm Bookbinder, MD;  Location: [place];  Service: General;  Laterality: N/A;  left port removal  . PORTACATH PLACEMENT     left subclavian  . UPPER GASTROINTESTINAL ENDOSCOPY      ROS:   General:  No weight loss, Fever, chills  HEENT: No recent headaches, no nasal bleeding, no visual changes, no sore throat  Neurologic: No dizziness, blackouts, seizures. No recent symptoms of stroke or mini- stroke. No recent episodes of slurred speech, or temporary blindness.  Cardiac: No recent episodes of chest pain/pressure, no shortness of breath at rest.  No shortness of breath with exertion.  Denies history of atrial fibrillation or irregular heartbeat  Vascular: No history of rest pain in feet.  No history of claudication.  No history of non-healing ulcer, No history of DVT   Pulmonary: No home oxygen, no productive cough, no hemoptysis,  No asthma or wheezing  Musculoskeletal:  [ ]  Arthritis, [ ]  Low back pain,  [ ]  Joint pain  Hematologic:No history of hypercoagulable state.  No history of easy bleeding.  No history of anemia  Gastrointestinal: No hematochezia or melena,  No gastroesophageal reflux, no trouble swallowing  Urinary: [ ]  chronic Kidney disease, [ ]  on HD - [ ]  MWF or [ ]  TTHS, [ ]  Burning with urination, [ ]  Frequent urination, [ ]  Difficulty urinating;    Skin: No rashes  Psychological: No history of anxiety,  No history of depression  Social History Social History   Tobacco Use  . Smoking status: Former Smoker    Types: Cigarettes    Quit date: 11/05/1965    Years since quitting: 54.2  . Smokeless tobacco: Never Used  . Tobacco comment: socially as a teen  Substance Use Topics  . Alcohol use: No    Alcohol/week: 0.0 standard drinks  . Drug use: No    Family History Family History  Problem Relation Age of Onset  . Cancer Mother 74       uterine  .  CAD Father 46       MI  . Hypertension Father 26  . Alzheimer's disease Father 71  . Cancer Cousin        x2, breast  . Diabetes Sister   . Cancer Paternal Grandmother        melanoma, possibly  . Colon cancer Neg Hx   . Colon polyps Neg Hx   . Rectal cancer Neg Hx   . Stomach cancer Neg Hx     Allergies  Allergies  Allergen Reactions  . Sulfa Antibiotics Itching, Swelling and Shortness Of Breath    Facial swelling  . Voltaren [Diclofenac Sodium] Shortness Of Breath  . Latex Other (See Comments)    Blisters ONLY HAD REACTION TO TAPE  (??? ONLY ADHESIVE ALLERGY)  . Tape Other (See Comments)    Caused blisters - must use paper tape - same reaction from NeoPrene  . Other     Neoprene     Current Outpatient Medications  Medication Sig Dispense Refill  . acetaminophen (TYLENOL ARTHRITIS PAIN) 650 MG CR tablet Take 1,300 mg by mouth every 8 (eight) hours as needed for pain.     Marland Kitchen albuterol (PROVENTIL) (2.5 MG/3ML) 0.083% nebulizer solution Take 3 mLs (2.5 mg total) by nebulization every 6 (six) hours as needed for wheezing or shortness of breath. 150 mL 1  . albuterol (VENTOLIN HFA) 108 (90 Base) MCG/ACT inhaler INHALE 2 PUFFS BY MOUTH EVERY 6 HORUS AS NEEDED FOR WHEEZING/SHORTNESS OF BREATH 8.5 g 3  . aspirin EC 81 MG tablet Take 81 mg by mouth at bedtime. Melanoma prevention    . azelastine (ASTELIN) 0.1 % nasal spray Place 1 spray into both nostrils 2 (two) times  daily. Use in each nostril as directed 30 mL 3  . famotidine (PEPCID) 20 MG tablet Take 1 tablet (20 mg total) by mouth at bedtime.    . furosemide (LASIX) 40 MG tablet TAKE 1 TABLET BY MOUTH EVERY DAY 30 tablet 5  . loratadine (CLARITIN) 10 MG tablet Take 10 mg by mouth daily.    Marland Kitchen losartan (COZAAR) 25 MG tablet TAKE 2 TABLETS BY MOUTH EVERY DAY 60 tablet 5  . meloxicam (MOBIC) 15 MG tablet Take 1 tablet (15 mg total) by mouth daily as needed for pain. 30 tablet 1  . montelukast (SINGULAIR) 10 MG tablet TAKE 1 TABLET BY MOUTH EVERY DAY 30 tablet 3  . Multiple Vitamin (MULTIVITAMIN WITH MINERALS) TABS tablet Take 1 tablet by mouth daily.    . pentoxifylline (TRENTAL) 400 MG CR tablet Take 400 mg by mouth 3 (three) times daily.    . Polyvinyl Alcohol-Povidone (TEARS PLUS OP) Place 1 drop into both eyes daily as needed (dry eyes/ redness/ burning).    . potassium chloride SA (KLOR-CON) 20 MEQ tablet Take 1 tablet (20 mEq total) by mouth 2 (two) times daily. 180 tablet 3  . PRESCRIPTION MEDICATION CPAP    . Probiotic Product (PROBIOTIC DAILY PO) Take 1 tablet by mouth daily.     Marland Kitchen triamcinolone (NASACORT ALLERGY 24HR) 55 MCG/ACT AERO nasal inhaler Place 2 sprays into the nose daily. In each nostril    . UNABLE TO FIND Take by mouth.    . Vitamin D, Ergocalciferol, (DRISDOL) 1.25 MG (50000 UNIT) CAPS capsule TAKE 1 CAPSULE (50,000 UNITS TOTAL) BY MOUTH EVERY 7 (SEVEN) DAYS. 12 capsule 3   No current facility-administered medications for this visit.    Physical Examination  Vitals:   01/08/20 1004  BP: Marland Kitchen)  144/89  Pulse: 79  Resp: 16  Temp: (!) 97.3 F (36.3 C)  TempSrc: Temporal  SpO2: 99%  Weight: 222 lb (100.7 kg)  Height: 5\' 3"  (1.6 m)    Body mass index is 39.33 kg/m.  General:  Alert and oriented, no acute distress HEENT: Normal Neck: No bruit or JVD Pulmonary: Clear to auscultation bilaterally Cardiac: Regular Rate and Rhythm without murmur Abdomen: Soft, non-tender,  non-distended, no mass, no scars Skin: No rash Left lateral/medial and posterior ankle wounds      Extremity Pulses:  2+ radial, brachial, femoral  pulses bilaterally.  No palpable dorsalis pedis, posterior tibial Musculoskeletal: No deformity or edema  Neurologic: Upper and lower extremity motor 5/5 and symmetric  DATA:   IVC/Iliac Findings:  +----------+------+--------+--------+    IVC  PatentThrombusComments  +----------+------+--------+--------+  IVC Prox patent          +----------+------+--------+--------+  IVC Mid  patent          +----------+------+--------+--------+  IVC Distalpatent          +----------+------+--------+--------+     +-------------------+---------+-----------+---------+-----------+----------  ----+      CIV    RT-PatentRT-ThrombusLT-PatentLT-Thrombus   Comments    +-------------------+---------+-----------+---------+-----------+----------  ----+  Common Iliac Prox  patent        patent       stent  left CIV  +-------------------+---------+-----------+---------+-----------+----------  ----+  Common Iliac Mid   patent        patent       stent  left CIV  +-------------------+---------+-----------+---------+-----------+----------  ----+  Common Iliac Distal patent        patent       stent  left CIV  +-------------------+---------+-----------+---------+-----------+----------  ----+      +-------------------------+---------+-----------+---------+-----------+----  ----+        EIV       RT-PatentRT-ThrombusLT-PatentLT-ThrombusComments  +-------------------------+---------+-----------+---------+-----------+----  ----+  External Iliac Vein Prox  patent        patent              +-------------------------+---------+-----------+---------+-----------+----  ----+  External Iliac  Vein Mid  patent        patent              +-------------------------+---------+-----------+---------+-----------+----  ----+  External Iliac Vein    patent        patent              Distal                                       +-------------------------+---------+-----------+---------+-----------+----  ----+           Summary:  IVC/Iliac: There is no evidence of thrombus involving the IVC. There is no  evidence of thrombus involving the right common iliac vein. There is no  evidence of thrombus involving the right external iliac vein and left  external iliac vein. The right common  iliac vein stent appears patent. The bilateral common femoral veins are  compressible and appear patent.   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  S/P left common  iliac stent.    He wounds are much more superficial than previous pictures.  He wound care physician has started her on Trental and has stopped her Asprin and Plavix.     PLAN:  I have advised her to start an exercise program of walking at least 10 mins. At a time.  Elevation when at  rest and continued wound care.  She will f/u in 6 months for repeat IVC duplex and ABI's for artrial flow baseline in 4 weeks.   Roxy Horseman PA-C [facility] Office: [phone]  MD in clinic [place]

## 2020-01-08 ENCOUNTER — Ambulatory Visit (INDEPENDENT_AMBULATORY_CARE_PROVIDER_SITE_OTHER): Payer: Medicare Other | Admitting: Physician Assistant

## 2020-01-08 ENCOUNTER — Other Ambulatory Visit: Payer: Self-pay

## 2020-01-08 ENCOUNTER — Ambulatory Visit (HOSPITAL_COMMUNITY)
Admission: RE | Admit: 2020-01-08 | Discharge: 2020-01-08 | Disposition: A | Discharge status: Home / Self Care | Payer: Medicare Other | Source: Ambulatory Visit | Attending: Vascular Surgery | Admitting: Vascular Surgery

## 2020-01-08 VITALS — BP 144/89 | HR 79 | Temp 97.3°F | Resp 16 | Ht 63.0 in | Wt 222.0 lb

## 2020-01-08 DIAGNOSIS — R609 Edema, unspecified: Secondary | ICD-10-CM | POA: Diagnosis not present

## 2020-01-08 DIAGNOSIS — I87332 Chronic venous hypertension (idiopathic) with ulcer and inflammation of left lower extremity: Secondary | ICD-10-CM | POA: Diagnosis not present

## 2020-01-08 DIAGNOSIS — I871 Compression of vein: Secondary | ICD-10-CM | POA: Diagnosis not present

## 2020-01-08 DIAGNOSIS — I10 Essential (primary) hypertension: Secondary | ICD-10-CM | POA: Diagnosis not present

## 2020-01-08 DIAGNOSIS — I83029 Varicose veins of left lower extremity with ulcer of unspecified site: Secondary | ICD-10-CM | POA: Diagnosis not present

## 2020-01-08 DIAGNOSIS — I83892 Varicose veins of left lower extremities with other complications: Secondary | ICD-10-CM

## 2020-01-08 DIAGNOSIS — L97929 Non-pressure chronic ulcer of unspecified part of left lower leg with unspecified severity: Secondary | ICD-10-CM | POA: Diagnosis not present

## 2020-01-08 DIAGNOSIS — I872 Venous insufficiency (chronic) (peripheral): Secondary | ICD-10-CM | POA: Diagnosis not present

## 2020-01-08 DIAGNOSIS — L97221 Non-pressure chronic ulcer of left calf limited to breakdown of skin: Secondary | ICD-10-CM | POA: Diagnosis not present

## 2020-01-08 DIAGNOSIS — J45909 Unspecified asthma, uncomplicated: Secondary | ICD-10-CM | POA: Diagnosis not present

## 2020-01-08 DIAGNOSIS — I890 Lymphedema, not elsewhere classified: Secondary | ICD-10-CM | POA: Diagnosis not present

## 2020-01-11 ENCOUNTER — Other Ambulatory Visit: Payer: Self-pay | Admitting: *Deleted

## 2020-01-11 DIAGNOSIS — I871 Compression of vein: Secondary | ICD-10-CM

## 2020-01-11 NOTE — Progress Notes (Signed)
Amber, [name] ([id number]) Visit Report for 01/08/2020 Arrival Information Details Patient Name: [name], [name]. Date of Service: 01/08/2020 3:30 PM Medical Record Number: [id number] Patient Account Number: [id number] Date of Birth/Sex: 05-21-1946 (74 y.o. F) Treating RN: Army Melia Primary Care Xyler Terpening: [name] Other Clinician: Referring Silena Wyss: [name] Treating Kamron Portee/Extender: Melburn Hake, HOYT Weeks in Treatment: 87 Visit Information History Since Last Visit Added or deleted any medications: No Patient Arrived: Ambulatory Any new allergies or adverse reactions: No Arrival Time: 15:37 Had a fall or experienced change in No Accompanied By: self activities of daily living that may affect Transfer Assistance: None risk of falls: Patient Identification Verified: Yes Signs or symptoms of abuse/neglect since last visito No Patient Requires Transmission-Based No Hospitalized since last visit: No Precautions: Has Dressing in Place as Prescribed: Yes Patient Has Alerts: Yes Pain Present Now: Yes Patient Alerts: Patient on Blood Thinner aspirin 81 Electronic Signature(s) Signed: 01/11/2020 8:02:25 AM By: Army Melia Entered By: Army Melia on 01/08/2020 15:37:40 Wieneke, [name] -------------------------------------------------------------------------------- Compression Therapy Details Patient Name: Amber, [name]. Date of Service: 01/08/2020 3:30 PM Medical Record Number: [id number] Patient Account Number: [id number] Date of Birth/Sex: 11/23/1945 (74 y.o. F) Treating RN: Army Melia Primary Care Janesia Joswick: [name] Other Clinician: Referring Kiaira Pointer: [name] Treating Ramez Arrona/Extender: STONE III, HOYT Weeks in Treatment: 56 Compression Therapy Performed for Wound Assessment: Wound #5 Left,Medial Lower Leg Performed By: Clinician Army Melia, RN Compression Type: Four Layer Electronic Signature(s) Signed: 01/11/2020 8:02:25  AM By: Army Melia Entered By: Army Melia on 01/08/2020 15:51:02 Womac, [name] -------------------------------------------------------------------------------- Compression Therapy Details Patient Name: Amber, [name]. Date of Service: 01/08/2020 3:30 PM Medical Record Number: [id number] Patient Account Number: [id number] Date of Birth/Sex: Dec 07, 1945 (74 y.o. F) Treating RN: Army Melia Primary Care Mekaylah Klich: [name] Other Clinician: Referring Lilliona Blakeney: [name] Treating Zevin Nevares/Extender: STONE III, HOYT Weeks in Treatment: 56 Compression Therapy Performed for Wound Assessment: Wound #6 Left,Lateral Lower Leg Performed By: Clinician Army Melia, RN Compression Type: Four Layer Electronic Signature(s) Signed: 01/11/2020 8:02:25 AM By: Army Melia Entered By: Army Melia on 01/08/2020 15:51:02 Perrault, [name] -------------------------------------------------------------------------------- Compression Therapy Details Patient Name: Amber, [name]. Date of Service: 01/08/2020 3:30 PM Medical Record Number: [id number] Patient Account Number: [id number] Date of Birth/Sex: 09-Jun-1946 (74 y.o. F) Treating RN: Army Melia Primary Care Garyn Arlotta: [name] Other Clinician: Referring Jamonta Goerner: [name] Treating Shikira Folino/Extender: STONE III, HOYT Weeks in Treatment: 56 Compression Therapy Performed for Wound Assessment: Wound #9 Left,Lateral,Posterior Lower Leg Performed By: Clinician Army Melia, RN Compression Type: Four Layer Electronic Signature(s) Signed: 01/11/2020 8:02:25 AM By: Army Melia Entered By: Army Melia on 01/08/2020 15:51:02 Breckenridge, [name] -------------------------------------------------------------------------------- Encounter Discharge Information Details Patient Name: Amber, [name]. Date of Service: 01/08/2020 3:30 PM Medical Record Number: [id number] Patient Account Number:  [id number] Date of Birth/Sex: 1946/03/30 (74 y.o. F) Treating RN: Army Melia Primary Care Gautam Langhorst: [name] Other Clinician: Referring Vallie Teters: [name] Treating Jahdiel Krol/Extender: Melburn Hake, HOYT Weeks in Treatment: 40 Encounter Discharge Information Items Discharge Condition: Stable Ambulatory Status: Ambulatory Discharge Destination: Home Transportation: Private Auto Accompanied By: self Schedule Follow-up Appointment: Yes Clinical Summary of Care: Electronic Signature(s) Signed: 01/11/2020 8:02:25 AM By: Army Melia Entered By: Army Melia on 01/08/2020 15:51:45 Dumlao, [name] -------------------------------------------------------------------------------- Wound Assessment Details Patient Name: [name], Amber. Date of Service: 01/08/2020 3:30 PM Medical Record Number: [id number] Patient Account Number: [id number] Date of Birth/Sex: October 10, 1946 (74 y.o. F) Treating RN: Army Melia Primary  Care Corissa Oguinn: [name] Other Clinician: Referring Emmabelle Fear: [name] Treating Adelene Polivka/Extender: STONE III, HOYT Weeks in Treatment: 31 Wound Status Wound Number: 5 Primary Etiology: Lymphedema Wound Location: Left, Medial Lower Leg Wound Status: Open Wounding Event: Gradually Appeared Date Acquired: 11/19/2018 Weeks Of Treatment: 56 Clustered Wound: No Wound Measurements Length: (cm) 4.5 Width: (cm) 2.5 Depth: (cm) 0.2 Area: (cm) 8.836 Volume: (cm) 1.767 % Reduction in Area: -3.4% % Reduction in Volume: -106.7% Wound Description Classification: Full Thickness Without Exposed Support Structure s Treatment Notes Wound #5 (Left, Medial Lower Leg) Notes s. cell, Drawtext, ABD, 4 layer Electronic Signature(s) Signed: 01/11/2020 8:02:25 AM By: Army Melia Entered By: Army Melia on 01/08/2020 15:50:47 Goens, [name] -------------------------------------------------------------------------------- Wound Assessment  Details Patient Name: Amber, [name]. Date of Service: 01/08/2020 3:30 PM Medical Record Number: [id number] Patient Account Number: [id number] Date of Birth/Sex: [birth year] (74 y.o. F) Treating RN: Army Melia Primary Care Tongela Encinas: [name] Other Clinician: Referring Ulyess Muto: [name] Treating Dakisha Schoof/Extender: STONE III, HOYT Weeks in Treatment: 56 Wound Status Wound Number: 6 Primary Etiology: Venous Leg Ulcer Wound Location: Left, Lateral Lower Leg Wound Status: Open Wounding Event: Gradually Appeared Date Acquired: 01/19/2019 Weeks Of Treatment: 50 Clustered Wound: No Wound Measurements Length: (cm) 5 Width: (cm) 4.5 Depth: (cm) 0.2 Area: (cm) 17.671 Volume: (cm) 3.534 % Reduction in Area: -7932.3% % Reduction in Volume: -15963.6% Wound Description Classification: Full Thickness Without Exposed Support Structure s Treatment Notes Wound #6 (Left, Lateral Lower Leg) Notes s. cell, Drawtext, ABD, 4 layer Electronic Signature(s) Signed: 01/11/2020 8:02:25 AM By: Army Melia Entered By: Army Melia on 01/08/2020 15:50:47 Minnis, [name] -------------------------------------------------------------------------------- Wound Assessment Details Patient Name: HARSHITHA, MARCENGILL. Date of Service: 01/08/2020 3:30 PM Medical Record Number: [id number] Patient Account Number: [id number] Date of Birth/Sex: 09/17/1946 (74 y.o. F) Treating RN: Army Melia Primary Care Paddy Neis: [name] Other Clinician: Referring Kawon Willcutt: [name] Treating Adal Sereno/Extender: STONE III, HOYT Weeks in Treatment: 56 Wound Status Wound Number: 9 Primary Etiology: Venous Leg Ulcer Wound Location: Left, Lateral, Posterior Lower Leg Wound Status: Open Wounding Event: Gradually Appeared Date Acquired: 07/08/2019 Weeks Of Treatment: 26 Clustered Wound: No Wound Measurements Length: (cm) 1 Width: (cm) 1 Depth: (cm) 0.1 Area: (cm) 0.785 Volume: (cm)  0.079 % Reduction in Area: -38.9% % Reduction in Volume: -38.6% Wound Description Classification: Full Thickness Without Exposed Support Structure s Treatment Notes Wound #9 (Left, Lateral, Posterior Lower Leg) Notes s. cell, Drawtext, ABD, 4 layer Electronic Signature(s) Signed: 01/11/2020 8:02:25 AM By: Army Melia Entered By: Army Melia on 01/08/2020 15:50:47

## 2020-01-13 ENCOUNTER — Other Ambulatory Visit: Payer: Self-pay

## 2020-01-13 ENCOUNTER — Encounter: Payer: Medicare Other | Admitting: Internal Medicine

## 2020-01-13 DIAGNOSIS — S81802A Unspecified open wound, left lower leg, initial encounter: Secondary | ICD-10-CM | POA: Diagnosis not present

## 2020-01-13 DIAGNOSIS — I87332 Chronic venous hypertension (idiopathic) with ulcer and inflammation of left lower extremity: Secondary | ICD-10-CM | POA: Diagnosis not present

## 2020-01-13 DIAGNOSIS — I890 Lymphedema, not elsewhere classified: Secondary | ICD-10-CM | POA: Diagnosis not present

## 2020-01-13 DIAGNOSIS — J45909 Unspecified asthma, uncomplicated: Secondary | ICD-10-CM | POA: Diagnosis not present

## 2020-01-13 DIAGNOSIS — I872 Venous insufficiency (chronic) (peripheral): Secondary | ICD-10-CM | POA: Diagnosis not present

## 2020-01-13 DIAGNOSIS — L97221 Non-pressure chronic ulcer of left calf limited to breakdown of skin: Secondary | ICD-10-CM | POA: Diagnosis not present

## 2020-01-13 DIAGNOSIS — L97222 Non-pressure chronic ulcer of left calf with fat layer exposed: Secondary | ICD-10-CM | POA: Diagnosis not present

## 2020-01-13 DIAGNOSIS — I10 Essential (primary) hypertension: Secondary | ICD-10-CM | POA: Diagnosis not present

## 2020-01-13 NOTE — Progress Notes (Signed)
REATHER, HALILOVIC ([id number]) Visit Report for 01/13/2020 HPI Details Patient Name: [name], [name]. Date of Service: 01/13/2020 12:45 PM Medical Record Number: [id number] Patient Account Number: [id number] Date of Birth/Sex: 1945/11/20 (74 y.o. F) Treating RN: Cornell Barman Primary Care Provider: [name] Other Clinician: Referring Provider: [name] Treating Provider/Extender: Tito Dine in Treatment: 49 History of Present Illness HPI Description: Pleasant 74 year old with history of chronic venous insufficiency. No diabetes or peripheral vascular disease. Left ABI 1.29. Questionable history of left lower extremity DVT. She developed a recurrent ulceration on her left lateral calf in December 2015, which she attributes to poor diet and subsequent lower extremity edema. She underwent endovenous laser ablation of her left greater saphenous vein in 2010. She underwent laser ablation of accessory branch of left GSV in April 2016 by Dr. Kellie Simmering at [facility]. She was previously wearing Unna boots, which she tolerated well. Tolerating 2 layer compression and cadexomer iodine. She returns to clinic for follow-up and is without new complaints. She denies any significant pain at this time. She reports persistent pain with pressure. No claudication or ischemic rest pain. No fever or chills. No drainage. READMISSION 11/13/16; this is a 74 year old woman who is not a diabetic. She is here for a review of a painful area on her left medial lower extremity. I note that she was seen here previously last year for wound I believe to be in the same area. At that time she had undergone previously a left greater saphenous vein ablation by Dr. Kellie Simmering and she had a ablation of the anterior accessory branch of the left greater saphenous vein in March 2016. Seeing that the wound actually closed over. In reviewing the history with her today the ulcer in this area has been recurrent. She describes  a biopsy of this area in 2009 that only showed stasis physiology. 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 Do not see that she has ever had arterial studies in the left leg. She tells me she has a follow-up with Dr. Kellie Simmering in roughly 10 days In any case she developed the reopening of this area roughly a month ago. On the background of this she describes rapidly increasing edema which has responded to Lasix 40 mg and metolazone 2.5 mg as well as the patient's lymph massage. She has been told she has both venous insufficiency and lymphedema but she cannot tolerate compression stockings 11/28/16; the patient saw Dr. Kellie Simmering recently. Per the patient he did arterial Dopplers in the office that did not show evidence of arterial insufficiency, per the patient he stated "treat this like an ordinary venous ulcer". She also saw her dermatologist Dr. Ronnald Ramp who felt that this was more of a vascular ulcer. In general things are improving although she arrives today with increasing bilateral lower extremity edema with weeping a deeper fluid through the wound on the left medial leg compatible with some degree of lymphedema 12/04/16; the patient's wound is fully epithelialized but I don't think fully healed. We will do another week of depression with Promogran and TCA however I suspect we'll be able to discharge her next week. This is a very unusual-looking wound which was initially a figure-of-eight type wound lying on its side surrounded by petechial like hemorrhage. She  has had venous ablation on this side. She apparently does not have an arterial issue per Dr. Kellie Simmering. She saw her dermatologist thought it was "vascular". Patient is definitely going to need ongoing compression and I talked about this  with her today she will go to elastic therapy after she leaves here next week 12/11/16; the patient's wound is not completely closed today. She has surrounding scar tissue and in further discussion with the patient it would appear that she had ulcers in this area in 2009 for a prolonged period of time ultimately requiring a punch biopsy of this area that only showed venous insufficiency. I did not previously pickup on this part of the history from the patient. 12/18/16; the patient's wound is completely epithelialized. There is no open area here. She has significant bilateral venous insufficiency with secondary lymphedema to a mild-to-moderate degree she does not have compression stockings.. She did not say anything to me when I was in the room, she told our intake nurse that she was still having pain in this area. This isn't unusual recurrent small open area. She is going to go to elastic therapy to obtain compression stockings. 12/25/16; the patient's wound is fully epithelialized. There is no open area here. The patient describes some continued episodic discomfort in this area medial left calf. However everything looks fine and healed here. She is been to elastic therapy and caught herself 15-20 mmHg stockings, they apparently were having trouble getting 20-30 mm stockings in her size 01/22/17; this is a patient we discharged from the clinic a month ago. She has a recurrent open wound on her medial left calf. She had 15 mm support stockings. I told her I thought she needed 20-30 mm compression stockings. She tells me that she has been ill with hospitalization secondary to asthma and is been found to have severe hypokalemia likely secondary to a combination of Lasix and metolazone. This morning she noted blistering and leaking fluid on the posterior part of her left leg. She called our intake nurse urgently and we was saw her this afternoon. She has not had any real discomfort here. I don't know that  she's been wearing any stockings on this leg for at least 2-3 days. ABIs in this clinic were 1.21 on the right and 1.3 on the left. She is previously seen vascular surgery who does not think that there is a peripheral arterial issue. 01/30/17; Patient arrives with no open wound on the left leg. She has been to elastic therapy and obtained 20-28mmhg below knee stockings and she has one on the right leg today. READMISSION 02/19/18; this Henes is a now 74 year old patient we've had in this clinic perhaps 3 times before. I had last looked at her from January 07 December 2016 with an area on the medial left leg. We discharged her on 12/25/16 however she had to be readmitted on 01/22/17 with a recurrence. I have in my notes that we discharged her on 20-30 mm stockings although she tells me she was only wearing support hose because she cannot get stockings on [name], BETTINI. ([id number]) predominantly related to her cervical spine surgery/issues. She has had previous ablations done by vein and vascular in [place] including a great saphenous vein ablation on the left with an anterior accessory branch ablation I think both of these were in 2016. On one of the previous visit she had a biopsy noted 2009 that was negative. She is not felt to have an arterial issue. She is not  a diabetic. She does have a history of obstructive sleep apnea hypertension asthma as well as chronic venous insufficiency and lymphedema. On this occasion she noted 2 dry scaly patch on her left leg. She tried to put lotion on this it didn't really help. There were 2 open areas.the patient has been seeing her primary physician from 02/05/18 through 02/14/18. She had Unna boots applied. The superior wound now on the lateral left leg has closed but she's had one wound that remains open on the lateral left leg. This is not the same spot as we dealt with in 2018. ABIs in this clinic were 1.3 bilaterally 02/26/18; patient has a small wound on the  left lateral calf. Dimensions are down. She has chronic venous insufficiency and lymphedema. 03/05/18; small open area on the left lateral calf. Dimensions are down. Tightly adherent necrotic debris over the surface of the wound which was difficult to remove. Also the dressing [over collagen] stuck to the wound surface. This was removed with some difficulty as well. Change the primary dressing to Hydrofera Blue ready 03/12/18; small open area on the left lateral calf. Comes in with tightly adherent surface eschar as well as some adherent Hydrofera Blue. 03/19/18; open area on the left lateral calf. Again adherent surface eschar as well as some adherent Hydrofera Blue nonviable subcutaneous tissue. She complained of pain all week even with the reduction from 4-3 layer compression I put on last week. Also she had an increase in her ankle and calf measurements probably related to the same thing. 03/26/18; open area on the left lateral calf. A very small open area remains here. We used silver alginate starting last week as the Hydrofera Blue seem to stick to the wound bed. In using 4-layer compression 04/02/18; the open area in the left lateral calf at some adherent slough which I removed there is no open area here. We are able to transition her into her own compression stocking. Truthfully I think this is probably his support hose. However this does not maintain skin integrity will be limited. She cannot put over the toe compression stockings on because of neck problems hand problems etc. She is allergic to the lining layer of juxta lites. We might be forced to use extremitease stocking should this fail READMIT 11/24/2018 Patient is now a 74 year old woman who is not a diabetic. She has been in this clinic on at least 3 previous occasions largely with recurrent wounds on her left leg secondary to chronic venous insufficiency with secondary lymphedema. Her situation is complicated by inability to get stockings  on and an allergy to neoprene which is apparently a component and at least juxta lites and other stockings. As a result she really has not been wearing any stockings on her legs. She tells [place] that roughly 2 or 3 weeks ago she started noticing a stinging sensation just above her ankle on the left medial aspect. She has been diagnosed with pseudogout and she wondered whether this was what she was experiencing. She tried to dress this with something she bought at the store however subsequently it pulled skin off and now she has an open wound that is not improving. She has been using Vaseline gauze with a cover bandage. She saw her primary doctor last week who put an Haematologist on her. ABIs in this clinic was 1.03 on the left 2/12; the area is on the left medial ankle. Odd-looking wound with what looks to be surface epithelialization but a multitude of  small petechial openings. This clearly not closed yet. We have been using silver alginate under 3 layer compression with TCA 2/19; the wound area did not look quite as good this week. Necrotic debris over the majority of the wound surface which required debridement. She continues to have a multitude of what looked to be small petechial openings. She reminds [place] that she had a biopsy on this initially during her first outbreak in 2015 in [place] dermatology. She expresses concern about this being a possible melanoma. 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 I assured her that this does not look anything like melanoma. Except for the petechial reaction it does look like a venous insufficiency area and she certainly has evidence of this on both sides 2/26; a difficult area on the left medial ankle. The patient clearly has chronic venous hypertension with some degree of lymphedema. The odd thing about the area is the small petechial hemorrhages. I am not really sure how  to explain this. This was present last time and this is not a compression injury. We have been using Hydrofera Blue which I changed to last week 3/4; still using Hydrofera Blue. Aggressive debridement today. She does not have known arterial issues. She has seen Dr. Kellie Simmering at [facility] vein and vascular and and has an ablation on the left. [Anterior accessory branch of the greater saphenous]. From what I remember they did not feel she had an arterial issue. The patient has had this area biopsied in 2009 at [facility] dermatology and by her recollection they said this was "stasis". She is also follow-up with dermatology locally who thought that this was more of a vascular issue 3/11; using Hydrofera Blue. Aggressive debridement today. She does not have an arterial issue. We are using 3 layer compression although we may need to go to 4. The patient has been in for multiple changes to her wrap since I last saw her a week ago. She says that the area was leaking. I do not have too much more information on what was found 01/19/19 on evaluation today patient was actually being seen for a nurse visit when unfortunately she had the area on her left lateral lower extremity as well as weeping from the right lower extremity that became apparent. Therefore we did end up actually seeing her for a full visit with myself. She is having some pain at this site as well but fortunately nothing too significant at this point. No fevers, chills, nausea, or vomiting noted at this time. 3/18-Patient is back to the clinic with the left leg venous leg ulcer, the ulcer is larger in size, has a surface that is densely adherent with fibrinous tissue, the Hydrofera Blue was used but is densely adherent and there was difficulty in removing it. The right lower extremity was also wrapped for weeping edema. Patient has a new area over the left lateral foot above the malleolus that is small and appears to have no debris with intact  surrounding skin. Patient is on increased dose of Lasix also as a means to edema management 3/25; the patient has a nonhealing venous ulcer on the medial left leg and last week developed a smaller area on the lateral left calf. We have been using Hydrofera Blue with a contact layer. 4/1; no major change in these wounds areas. Left medial and more recently left lateral calf. I tried Iodoflex last week to aid in debridement  she did not tolerate this. She stated her pain was terrible all week. She took the top layer of the 4 layer compression off. 4/8; the patient actually looks somewhat better in terms of her more prominent left lateral calf wound. There is some healthy looking tissue here. She is still complaining of a lot of discomfort. 4/15; patient in a lot of pain secondary to sciatica. She is on a prednisone taper prescribed by her primary physician. She has the 2 areas one on the [place], KESHON LEFLER. ([id number]) left medial and more recently a smaller area on the left lateral calf. Both of these just above the malleoli 4/22; her back pain is better but she still states she is very uncomfortable and now feels she is intolerant to the [employer]. No real change in the wounds we have been using Sorbact. She has been previously intolerant to Iodoflex. There is not a lot of option about what we can use to debride this wound under compression that she no doubt needs. sHe states Ultram no longer works for her pain 4/29; no major change in the wounds slightly increased depth. Surface on the original medial wound perhaps somewhat improved however the more recent area on the lateral left ankle is 100% covered in very adherent debris we have been using Sorbact. She tolerates 4 layer compression well and her edema control is a lot better. She has not had to come in for a nurse check 5/6; no major change in the condition of the wounds. She did consent to debridement today which was done with some difficulty.  Continuing Sorbact. She did not tolerate Iodoflex. She was in for a check of her compression the day after we wrapped her last week this was adjusted but nothing much was found 5/13; no major change in the condition or area of the wounds. I was able to get a fairly aggressive debridement done on the lateral left leg wound. Even using Sorbact under compression. She came back in on Friday to have the wrap changed. She says she felt uncomfortable on the lateral aspect of her ankle. She has a long history of chronic venous insufficiency including previous ablation surgery on this side. 5/20-Patient returns for wounds on left leg with both wounds covered in slough, with the lateral leg wound larger in size, she has been in 3 layer compression and felt more comfortable, she describes pain in ankle, in leg and pins and needles in foot, and is about to try Pamelor for this 6/3; wounds on the left lateral and left medial leg. The area medially which is the most recent of the 2 seems to have had the largest increase in dimensions. We have been using Sorbac to try and debride the surface. She has been to see orthopedics they apparently did a plain x-ray that was indeterminant. Diagnosed her with neuropathy and they have ordered an MRI to determine if there is underlying osteomyelitis. This was not high on my thought list but I suppose it is prudent. We have advised her to make an appointment with vein and vascular in [place]. She has a history of a left greater saphenous and accessory vein ablations I wonder if there is anything else that can be done from a surgical point of view to help in these difficult refractory wounds. We have previously healed this wound on one occasion but it keeps on reopening [medial side] 6/10; deep tissue culture I did last week I think on the left medial wound showed  both moderate E. coli and moderate staph aureus [MSSA]. She is going to require antibiotics and I have chosen  Augmentin. We have been using Sorbact and we have made better looking wound surface on both sides but certainly no improvement in wound area. She was back in last Friday apparently for a dressing changes the wrap was hurting her outer left ankle. She has not managed to get a hold of vein and vascular in [place]. We are going to have to make her that appointment 6/17; patient is tolerating the Augmentin. She had an MRI that I think was ordered by orthopedic surgeon this did not show osteomyelitis or an abscess did suggest cellulitis. We have been using Sorbact to the lateral and medial ankles. We have been trying to arrange a follow-up appointment with vein and vascular in [place] or did her original ablations. We apparently an area sent the request to vein and vascular in [facility] 6/24; patient has completed the Augmentin. We do not yet have a vein and vascular appointment in [place]. I am not sure what the issue is here we have asked her to call tomorrow. We are using Sorbact. Making some improvements and especially the medial wound. Both surfaces however look better medial and lateral. 7/1; the patient has been in contact with vein and vascular in [place] but has not yet received an appointment. Using Sorbact we have gradually improve the wound surface with no improvement in surface area. She is approved for Apligraf but the wound surface still is not completely viable. She has not had to come in for a dressing change 7/8; the patient has an appointment with vein and vascular on 7/31 which is a Friday afternoon. She is concerned about getting back here for [place] to dress her wounds. I think it is important to have them goal for her venous reflux/history of ablations etc. to see if anything else can be done. She apparently tested positive for 1 of the blood tests with regards to lupus and saw a rheumatologist. He has raised the issue of vasculitis again. I have had this thought in the  past however the evidence seems overwhelming that this is a venous reflux etiology. If the rheumatologist tells me there is clinical and laboratory investigation is positive for lupus I will rethink this. 7/15; the patient's wound surfaces are quite a bit better. The medial area which was her original wound now has no depth although the lateral wound which was the more recent area actually appears larger. Both with viable surfaces which is indeed better. Using Sorbact. I wanted to use Apligraf on her however there is the issue of the vein and vascular appointment on 7/31 at 2:00 in the afternoon which would not allow her to get back to be rewrapped and they would no doubt remove the graft 7/22; the patient's wound surfaces have moderate amount of debris although generally look better. The lateral one is larger with 2 small satellite areas superiorly. We are waiting for her vein and vascular appointment on 7/31. She has been approved for Apligraf which I would like to use after th 7/29; wound surfaces have improved no debridement is required we have been using Sorbact. She sees vein and vascular on Friday with this so question of whether anything can be done to lessen the likelihood of recurrence and/or speed the healing of these areas. She is already had previous ablations. She no doubt has severe venous hypertension 8/5-Patient returns at 1 week, she was in [employer]  for 3 days by her podiatrist, we have been using so backed to the wound, she has increased pain in both the wounds on the left lower leg especially the more distal one on the lateral aspect 8/12-Patient returns at 1 week and she is agreeable to having debridement in both wounds on her left leg today. We have been using Sorbact, and vascular studies were reviewed at last visit 8/19; the patient arrives with her wounds fairly clean and no debridement is required. We have used Sorbact which is really done a nice job in cleaning up these  very difficult wound surfaces. The patient saw Dr. Donzetta Matters of vascular surgery on 7/31. He did not feel that there was an arterial component. He felt that her treated greater saphenous vein is adequately addressed and that the small saphenous vein did not appear to be involved significantly. She was also noted to have deep venous reflux which is not treatable. Dr. Donzetta Matters mentioned the possibility of a central obstructive component leading to reflux and he offered her central venography. She wanted to discuss this or think about it. I have urged her to go ahead with this. She has had recurrent difficult wounds in these areas which do heal but after months in the clinic. If there is anything that can be done to reduce the likelihood of this I think it is worth it. 9/2 she is still working towards getting follow-up with Dr. Donzetta Matters to schedule her CT. Things are quite a bit worse venography. I put Apligraf on 2 weeks ago on both wounds on the medial and lateral part of her left lower leg. She arrives in clinic today with 3 superficial additional wounds above the area laterally and one below the wound medially. She describes a lot of discomfort. I think these are probably wrapped injuries. Does not look like she has cellulitis. 07/20/2019 on evaluation today patient appears to be doing somewhat poorly in regard to her lower extremity ulcers. She in fact showed signs of erythema in fact we may even be dealing with an infection at this time. Unfortunately I am unsure if this is just infection or if indeed there may be some allergic reaction that occurred as a result of the Apligraf application. With that being said that would be unusual but nonetheless not impossible in this patient is one who is unfortunately allergic to quite a bit. Currently we have been using the Sorbact which seems to do as well as anything for her. I do think we may want to obtain a culture today to see if there is anything showing up there  that may need to be addressed. JAZLEN, LEMAN ([id number]) 9/16; noted that last week the wounds look worse in 1 week follow-up of the Apligraf. Using Sorbact as of 2 days ago. She arrives with copious amounts of drainage and new skin breakdown on the back of the left calf. The wounds arm more substantial bilaterally. There is a fair amount of swelling in the left calf no overt DVT there is edema present I think in the left greater than right thigh. She is supposed to go on 9/28 for CT venography. The wounds on the medial and lateral calf are worse and she has new skin breakdown posteriorly at least new for me. This is almost developing into a circumferential wound area The Apligraf was taken off last week which I agree with things are not going in the right direction a culture was done we do not have that  back yet. She is on Augmentin that she started 2 days ago 9/23; dressing was changed by her nurses on Monday. In general there is no improvement in the wound areas although the area looks less angry than last week. She did get Augmentin for MSSA cultured on the 14th. She still appears to have too much swelling in the left leg even with 3 layer compression 9/30; the patient underwent her procedure on 9/28 by Dr. Donzetta Matters at [facility]. She was discovered to have the common iliac vein measuring 12.2 mm but at the level of L4-L5 measured 3 mm. After stenting it measured 10 mm. 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 Rouleaux flow in the common femoral and femoral vein was observed much improved after stenting. We are using silver alginate to the wounds on the medial and lateral ankle on the left. 4 layer compression 10/7; the patient had fluid swelling around her knee and 4 layer compression. At the advice of vein and vascular this was reduced to 3 layer which she is tolerating better. We have been using silver alginate under 3 layer compression since last  Friday 10/14; arrives with the areas on the left ankle looking a lot better. Inflammation in the area also a lot better. She came in for a nurse check on 10/9 10/21; continued nice improvement. Slight improvements in surface area of both the medial and lateral wounds on the left. A lot of the satellite lesions in the weeping erythema around these from stasis dermatitis is resolved. We have been using silver alginate 10/28; general improvement in the entire wound areas although not a lot of change in dimensions the wound certainly looks better. There is a lot less in terms of venous inflammation. Continue silver alginate this week however look towards Hydrofera Blue next week 11/4; very adherent debris on the medial wound left wound is not as bad. We have been using silver alginate. Change to [facility] today 11/11; very adherent debris on both wound areas. She went to vein and vascular last week and follow-up they put in [place] boot on this today. He says the [facility] was adherent. Wound is definitely not as good as last week. Especially on the left there the satellite lesions look more prominent 11/18; absolutely no better. erythema on lateral aspect with tenderness. 09/30/2019 on evaluation today patient appears to actually be doing better. Dr. Dellia Nims did put her on doxycycline last week which I do believe has helped her at this point. Fortunately there is no signs of active infection at this time. No fevers, chills, nausea, vomiting, or diarrhea. I do believe he may want extend the doxycycline for 7 additional days just to ensure everything does completely cleared up the patient is in agreement with that plan. Otherwise she is going require some sharp debridement today 12/2; patient is completing a 2-week course of doxycycline. I gave her this empirically for inflammation as well as infection when I last saw her 2 weeks ago. All of this seems to be better. She is using silver alginate she  has the area on the medial aspect of the larger area laterally and the 2 small satellite regions laterally above the major wound. 12/9; the patient's wound on the left medial and left lateral calf look really quite good. We have been using silver alginate. She saw vein and vascular in follow-up on 10/09/2019. She has had a previous left greater saphenous vein ablation by Dr. Oscar La in 2016.  More recently she underwent a left common iliac vein stent by Dr. 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 The swelling is improved and certainly the wounds have improved. The patient shows [place] today area on the right medial calf there is almost no wound but leaking lymphedema. She says she start this started 3 or 4 days ago. She did not traumatize it. It is not painful. She does not wear compression on that side 12/16; the patient continues to do well laterally. Medially still requiring debridement. The area on the right calf did not materialize to anything and is not currently open. We wrapped this last time. She has support stockings for that leg although I am not sure they are going to provide adequate compression 12/23; the lateral wound looks stable. Medially still requiring debridement for tightly adherent fibrinous debris. We've been using silver alginate. Surface area not any different 12/30; neither wound is any better with regards to surface and the area on the left lateral is larger. I been using silver alginate to the left lateral which look quite good last week and Sorbact to the left medial 11/11/2019. Lateral wound area actually looks better and somewhat smaller. Medial still requires a very aggressive debridement today. We have been using Sorbact on both wound areas 1/13; not much better still adherent debris bilaterally. I been using Sorbact. She has severe venous hypertension. Probably some degree of dermal fibrosis distally. I wonder whether tighter compression might help and I am going  to try that today. We also need to work on the bioburden 1/20; using Sorbact. She has severe venous hypertension status post stent placement for pelvic vein compression. We applied gentamicin last time to see if we could reduce bioburden I had some discussion with her today about the use of pentoxifylline. This is occasionally used in this setting for wounds with refractory venous insufficiency. However this interacts with Plavix. She tells me that she was put on this after stent placement for 3 months. She will call Dr. Claretha Cooper office to discuss 1/27; we are using gentamicin under Sorbact. 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 She has a stent. Wound medially is measuring smaller this week. Laterally measuring slightly larger although she has some satellite lesions superiorly 2/3; gentamicin under Sorbact under 4-layer compression. 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 She has a stent on Plavix. Her wounds are measuring smaller this week. More substantially laterally where there is a satellite lesion superiorly. 2/10; gentamicin under Sorbac. 4-layer compression. Patient communicated with Dr. Donzetta Matters at vein and vascular in [place]. He is okay with the patient coming off Plavix I will therefore start her on pentoxifylline for a 1 month trial. In general her wounds look better today. I had some concerns about swelling in the left thigh however she measures 61.5 on the right and 63 on the mid thigh which does not suggest there is any difficulty. The patient is not describing any pain. 2/17; gentamicin under Sorbac 4-layer compression. She has been on pentoxifylline for 1 week and complains of loose stool. No nausea she is eating and drinking well 2/24; the patient apparently came in 2 days ago for a nurse visit when her wrap fell down. Both areas look a little worse this week macerated medially and satellite lesions laterally. Change to  silver alginate today TRINADEE, RAYBON ([id number]) 3/3; wounds are larger today especially medially. She also has more swelling in her foot lower leg and I even  noted some swelling in her posterior thigh which is tender. I wonder about the patency of her stent. Fortuitously she sees Dr. Claretha Cooper group on Friday 3/10; Mrs. Davin was seen by vein and vascular on 3/5. The patient underwent ultrasound. There was no evidence of thrombosis involving the IVC no evidence of thrombosis involving the right common iliac vein there is no evidence of thrombosis involving the right external iliac vein the left external vein is also patent. The right common iliac vein stent appears patent bilateral common femoral veins are compressible and appear patent. I was concerned about the left common iliac stent however it looks like this is functional. She has some edema in the posterior thigh that was tender she still has that this week. I also note they had trouble finding the pulses in her left foot and booked her for an ABI baseline in 4 weeks. She will follow up in 6 months for repeat IVC duplex. The patient stopped the pentoxifylline because of diarrhea. It does not look like that was being effective in any case. I have advised her to go back on her aspirin 81 mg tablet, vascular it also suggested this Electronic Signature(s) Signed: 01/13/2020 5:25:14 PM By: Linton Ham MD Entered By: Linton Ham on 01/13/2020 13:33:43 Armato, [name] ([id number]) -------------------------------------------------------------------------------- Physical Exam Details Patient Name: MELYNA, [name]. Date of Service: 01/13/2020 12:45 PM Medical Record Number: [id number] Patient Account Number: [id number] Date of Birth/Sex: 1946-07-17 (74 y.o. F) Treating RN: Cornell Barman Primary Care Provider: [name] Other Clinician: Referring Provider: [name] Treating Provider/Extender: Tito Dine in  Treatment: 43 Cardiovascular Both dorsalis pedis and posterior tibial pulses in the left foot are easily palpable. Edema control is excellent. Notes Wound exam; both sides medial and lateral are larger but more superficial and less angry. Less irritated than last week. Electronic Signature(s) Signed: 01/13/2020 5:25:14 PM By: Linton Ham MD Entered By: Linton Ham on 01/13/2020 13:37:37 Stidd, [name] ([id number]) -------------------------------------------------------------------------------- Physician Orders Details Patient Name: LEVADA, [name]. Date of Service: 01/13/2020 12:45 PM Medical Record Number: [id number] Patient Account Number: [id number] Date of Birth/Sex: [birth year] (74 y.o. F) Treating RN: Cornell Barman Primary Care Provider: [name] Other Clinician: Referring Provider: [name] Treating Provider/Extender: Tito Dine in Treatment: 40 Verbal / Phone Orders: No Diagnosis Coding Wound Cleansing Wound #5 Left,Medial Lower Leg o anasept - in clinic Wound #6 Left,Lateral Lower Leg o anasept - in clinic Wound #9 Left,Lateral,Posterior Lower Leg o anasept - in clinic Anesthetic (add to Medication List) Wound #5 Left,Medial Lower Leg o Topical Lidocaine 4% cream applied to wound bed prior to debridement (In Clinic Only). Wound #6 Left,Lateral Lower Leg o Topical Lidocaine 4% cream applied to wound bed prior to debridement (In Clinic Only). Wound #9 Left,Lateral,Posterior Lower Leg o Topical Lidocaine 4% cream applied to wound bed prior to debridement (In Clinic Only). Skin Barriers/Peri-Wound Care Wound #5 Left,Medial Lower Leg o Triamcinolone Acetonide Ointment (TCA) Wound #6 Left,Lateral Lower Leg o Triamcinolone Acetonide Ointment (TCA) Wound #9 Left,Lateral,Posterior Lower Leg o Triamcinolone Acetonide Ointment (TCA) Primary Wound Dressing Wound #5 Left,Medial Lower Leg o Alginate Wound #6 Left,Lateral  Lower Leg o Alginate Wound #9 Left,Lateral,Posterior Lower Leg o Alginate Secondary Dressing Wound #5 Left,Medial Lower Leg o Drawtex Wound #6 Left,Lateral Lower Leg o Drawtex Wound #9 Left,Lateral,Posterior Lower Leg o Drawtex Dressing Change Frequency Wound #5 Left,Medial Lower Leg o Change dressing every week CAYCI, WENGLER ([id number]) o Other: - Friday after vascular  appointment Wound #6 Left,Lateral Lower Leg o Change dressing every week o Other: - Friday after vascular appointment Wound #9 Left,Lateral,Posterior Lower Leg o Change dressing every week o Other: - Friday after vascular appointment Follow-up Appointments Wound #5 Left,Medial Lower Leg o Return Appointment in 1 week. o Nurse Visit as needed - Friday after vascular appointment Wound #6 Left,Lateral Lower Leg o Return Appointment in 1 week. o Nurse Visit as needed - Friday after vascular appointment Wound #9 Left,Lateral,Posterior Lower Leg o Return Appointment in 1 week. o Nurse Visit as needed - Friday after vascular appointment Edema Control Wound #5 Left,Medial Lower Leg o 4-Layer Compression System - Left Lower Extremity. o Elevate legs to the level of the heart and pump ankles as often as possible Wound #6 Left,Lateral Lower Leg o 4-Layer Compression System - Left Lower Extremity. o Elevate legs to the level of the heart and pump ankles as often as possible Wound #9 Left,Lateral,Posterior Lower Leg o 4-Layer Compression System - Left Lower Extremity. o Elevate legs to the level of the heart and pump ankles as often as possible Medications-please add to medication list. Wound #5 Left,Medial Lower Leg o Other: - Continue Pentoxifylline Wound #6 Left,Lateral Lower Leg o Other: - Continue Pentoxifylline Electronic Signature(s) Signed: 01/13/2020 5:22:30 PM By: [name], [name], RN, CWS, [name], [name] Signed: 01/13/2020 5:25:14 PM By: Linton Ham  MD Entered By: [name], [name], RN, CWS, Kim on 01/13/2020 13:21:23 [name], [name] ([id number]) -------------------------------------------------------------------------------- Problem List Details Patient Name: VADIS, PERFECT. Date of Service: 01/13/2020 12:45 PM Medical Record Number: [id number] Patient Account Number: [id number] Date of Birth/Sex: 08/31/1946 (74 y.o. 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 Date of Service: 01/13/2020 12:45 PM Medical Record Number: [id number] Patient Account Number: [id number] Date of Birth/Sex: [birth year] (74 y.o. F) Treating RN: Cornell Barman Primary Care Provider: [name] Other Clinician: Referring Provider: [name] Treating Provider/Extender: Tito Dine in Treatment: 90 Subjective History of Present Illness  (HPI) Pleasant 74 year old with history of chronic venous insufficiency. No diabetes or peripheral vascular disease. Left ABI 1.29. Questionable history of left lower extremity DVT. She developed a recurrent ulceration on her left lateral calf in December 2015, which she attributes to poor diet and subsequent lower extremity edema. She underwent endovenous laser ablation of her left greater saphenous vein in 2010. She underwent laser ablation of accessory branch of left GSV in April 2016 by Dr. Kellie Simmering at [facility]. She was previously wearing Unna boots, which she tolerated well. Tolerating 2 layer compression and cadexomer iodine. She returns to clinic for follow-up and is without new complaints. She denies any significant pain at this time. She reports  persistent pain with pressure. No claudication or ischemic rest pain. No fever or chills. No drainage. READMISSION 11/13/16; this is a 74 year old woman who is not a diabetic. She is here for a review of a painful area on her left medial lower extremity. I note that she was seen here previously last year for wound I believe to be in the same area. At that time she had undergone previously a left greater saphenous vein ablation by Dr. Kellie Simmering and she had a ablation of the anterior accessory branch of the left greater saphenous vein in March 2016. Seeing that the wound actually closed over. In reviewing the history with her today the ulcer in this area has been recurrent. She describes a biopsy of this area in 2009 that only showed stasis physiology. 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 Do not see that she has ever had arterial studies in the left leg. She tells me she has a follow-up with Dr. Kellie Simmering in roughly 10 days In any case  she developed the reopening of this area roughly a month ago. On the background of this she describes rapidly increasing edema which has responded to Lasix 40 mg and metolazone 2.5 mg as well as the patient's lymph massage. She has been told she has both venous insufficiency and lymphedema but she cannot tolerate compression stockings 11/28/16; the patient saw Dr. Kellie Simmering recently. Per the patient he did arterial Dopplers in the office that did not show evidence of arterial insufficiency, per the patient he stated "treat this like an ordinary venous ulcer". She also saw her dermatologist Dr. Ronnald Ramp who felt that this was more of a vascular ulcer. In general things are improving although she arrives today with increasing bilateral lower extremity edema with weeping a deeper fluid through the wound on the left medial leg compatible with some degree of lymphedema 12/04/16; the patient's wound is fully epithelialized but I don't think fully healed. We will do another week of depression with Promogran and TCA however I suspect we'll be able to discharge her next week. This is a very unusual-looking wound which was initially a figure-of-eight type wound lying on its side surrounded by petechial like hemorrhage. She has had venous ablation on this side. She apparently does not have an arterial issue per Dr. Kellie Simmering. She saw her dermatologist thought it was "vascular". Patient is definitely going to need ongoing compression and I talked about this with her today she will go to elastic therapy after she leaves here next week 12/11/16; the patient's wound is not completely closed today. She has surrounding scar tissue and in further discussion with the patient it would appear that she had ulcers in this area in 2009 for a prolonged period of time ultimately requiring a punch biopsy of this area that only showed venous insufficiency. I did not previously pickup on this part of the history from the patient. 12/18/16; the  patient's wound is completely epithelialized. There is no open area here. She has significant bilateral venous insufficiency with secondary lymphedema to a mild-to-moderate degree she does not have compression stockings.. She did not say anything to me when I was in the room, she told our intake nurse that she was still having pain in this area. This isn't unusual recurrent  small open area. She is going to go to elastic therapy to obtain compression stockings. 12/25/16; the patient's wound is fully epithelialized. There is no open area here. The patient describes some continued episodic discomfort in this area medial left calf. However everything looks fine and healed here. She is been to elastic therapy and caught herself 15-20 mmHg stockings, they apparently were having trouble getting 20-30 mm stockings in her size 01/22/17; this is a patient we discharged from the clinic a month ago. She has a recurrent open wound on her medial left calf. She had 15 mm support stockings. I told her I thought she needed 20-30 mm compression stockings. She tells me that she has been ill with hospitalization secondary to asthma and is been found to have severe hypokalemia likely secondary to a combination of Lasix and metolazone. This morning she noted blistering and leaking fluid on the posterior part of her left leg. She called our intake nurse urgently and we was saw her this afternoon. She has not had any real discomfort here. I don't know that she's been wearing any stockings on this leg for at least 2-3 days. ABIs in this clinic were 1.21 on the right and 1.3 on the left. She is previously seen vascular surgery who does not think that there is a peripheral arterial issue. 01/30/17; Patient arrives with no open wound on the left leg. She has been to elastic therapy and obtained 20-30mmhg below knee stockings and she has one on the right leg today. READMISSION 02/19/18; this Alarid is a now 74 year old patient we've  had in this clinic perhaps 3 times before. I had last looked at her from January 07 December 2016 with an area on the medial left leg. We discharged her on 12/25/16 however she had to be readmitted on 01/22/17 with a recurrence. I have in my notes that we discharged her on 20-30 mm stockings although she tells me she was only wearing support hose because she cannot get stockings on predominantly related to her cervical spine surgery/issues. She has had previous ablations done by vein and vascular in [place] including a great saphenous vein ablation on the left with an anterior accessory branch ablation I think both of these were in 2016. On one of the previous visit she had a biopsy noted 2009 that was negative. She is not felt to have an arterial issue. She is not a diabetic. She does have a history of obstructive sleep apnea hypertension asthma as well as chronic venous insufficiency and lymphedema. ALYSEA, CREASY ([id number]) On this occasion she noted 2 dry scaly patch on her left leg. She tried to put lotion on this it didn't really help. There were 2 open areas.the patient has been seeing her primary physician from 02/05/18 through 02/14/18. She had Unna boots applied. The superior wound now on the lateral left leg has closed but she's had one wound that remains open on the lateral left leg. This is not the same spot as we dealt with in 2018. ABIs in this clinic were 1.3 bilaterally 02/26/18; patient has a small wound on the left lateral calf. Dimensions are down. She has chronic venous insufficiency and lymphedema. 03/05/18; small open area on the left lateral calf. Dimensions are down. Tightly adherent necrotic debris over the surface of the wound which was difficult to remove. Also the dressing [over collagen] stuck to the wound surface. This was removed with some difficulty as well. Change the primary dressing to Hydrofera Blue ready 03/12/18;  small open area on the left lateral calf. Comes in  with tightly adherent surface eschar as well as some adherent Hydrofera Blue. 03/19/18; open area on the left lateral calf. Again adherent surface eschar as well as some adherent Hydrofera Blue nonviable subcutaneous tissue. She complained of pain all week even with the reduction from 4-3 layer compression I put on last week. Also she had an increase in her ankle and calf measurements probably related to the same thing. 03/26/18; open area on the left lateral calf. A very small open area remains here. We used silver alginate starting last week as the Hydrofera Blue seem to stick to the wound bed. In using 4-layer compression 04/02/18; the open area in the left lateral calf at some adherent slough which I removed there is no open area here. We are able to transition her into her own compression stocking. Truthfully I think this is probably his support hose. However this does not maintain skin integrity will be limited. She cannot put over the toe compression stockings on because of neck problems hand problems etc. She is allergic to the lining layer of juxta lites. We might be forced to use extremitease stocking should this fail READMIT 11/24/2018 Patient is now a 74 year old woman who is not a diabetic. She has been in this clinic on at least 3 previous occasions largely with recurrent wounds on her left leg secondary to chronic venous insufficiency with secondary lymphedema. Her situation is complicated by inability to get stockings on and an allergy to neoprene which is apparently a component and at least juxta lites and other stockings. As a result she really has not been wearing any stockings on her legs. She tells [place] that roughly 2 or 3 weeks ago she started noticing a stinging sensation just above her ankle on the left medial aspect. She has been diagnosed with pseudogout and she wondered whether this was what she was experiencing. She tried to dress this with something she bought at the store  however subsequently it pulled skin off and now she has an open wound that is not improving. She has been using Vaseline gauze with a cover bandage. She saw her primary doctor last week who put an Haematologist on her. ABIs in this clinic was 1.03 on the left 2/12; the area is on the left medial ankle. Odd-looking wound with what looks to be surface epithelialization but a multitude of small petechial openings. This clearly not closed yet. We have been using silver alginate under 3 layer compression with TCA 2/19; the wound area did not look quite as good this week. Necrotic debris over the majority of the wound surface which required debridement. She continues to have a multitude of what looked to be small petechial openings. She reminds [place] that she had a biopsy on this initially during her first outbreak in 2015 in [facility]. She expresses concern about this being a possible melanoma. 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 I assured her that this does not look anything like melanoma. Except for the petechial reaction it does look like a venous insufficiency area and she certainly has evidence of this on both sides 2/26; a difficult area on the left medial ankle. The patient clearly has chronic venous hypertension with some degree of lymphedema. The odd thing about the area is the small petechial hemorrhages. I am not really sure how to explain this. This  was present last time and this is not a compression injury. We have been using Hydrofera Blue which I changed to last week 3/4; still using Hydrofera Blue. Aggressive debridement today. She does not have known arterial issues. She has seen Dr. Kellie Simmering at [facility] vein and vascular and and has an ablation on the left. [Anterior accessory branch of the greater saphenous]. From what I remember they did not feel she had an arterial issue. The patient  has had this area biopsied in 2009 at [facility] dermatology and by her recollection they said this was "stasis". She is also follow-up with dermatology locally who thought that this was more of a vascular issue 3/11; using Hydrofera Blue. Aggressive debridement today. She does not have an arterial issue. We are using 3 layer compression although we may need to go to 4. The patient has been in for multiple changes to her wrap since I last saw her a week ago. She says that the area was leaking. I do not have too much more information on what was found 01/19/19 on evaluation today patient was actually being seen for a nurse visit when unfortunately she had the area on her left lateral lower extremity as well as weeping from the right lower extremity that became apparent. Therefore we did end up actually seeing her for a full visit with myself. She is having some pain at this site as well but fortunately nothing too significant at this point. No fevers, chills, nausea, or vomiting noted at this time. 3/18-Patient is back to the clinic with the left leg venous leg ulcer, the ulcer is larger in size, has a surface that is densely adherent with fibrinous tissue, the Hydrofera Blue was used but is densely adherent and there was difficulty in removing it. The right lower extremity was also wrapped for weeping edema. Patient has a new area over the left lateral foot above the malleolus that is small and appears to have no debris with intact surrounding skin. Patient is on increased dose of Lasix also as a means to edema management 3/25; the patient has a nonhealing venous ulcer on the medial left leg and last week developed a smaller area on the lateral left calf. We have been using Hydrofera Blue with a contact layer. 4/1; no major change in these wounds areas. Left medial and more recently left lateral calf. I tried Iodoflex last week to aid in debridement she did not tolerate this. She stated her pain was  terrible all week. She took the top layer of the 4 layer compression off. 4/8; the patient actually looks somewhat better in terms of her more prominent left lateral calf wound. There is some healthy looking tissue here. She is still complaining of a lot of discomfort. 4/15; patient in a lot of pain secondary to sciatica. She is on a prednisone taper prescribed by her primary physician. She has the 2 areas one on the left medial and more recently a smaller area on the left lateral calf. Both of these just above the malleoli 4/22; her back pain is better but she still states she is very uncomfortable and now feels she is intolerant to the [employer]. No real change in the wounds we have been using Sorbact. She has been previously intolerant to Iodoflex. There is not a lot of option about what we can use to debride this wound under compression that she no doubt needs. sHe states Ultram no longer works for her pain ITXEL, WOMBACHER ([id number])  4/29; no major change in the wounds slightly increased depth. Surface on the original medial wound perhaps somewhat improved however the more recent area on the lateral left ankle is 100% covered in very adherent debris we have been using Sorbact. She tolerates 4 layer compression well and her edema control is a lot better. She has not had to come in for a nurse check 5/6; no major change in the condition of the wounds. She did consent to debridement today which was done with some difficulty. Continuing Sorbact. She did not tolerate Iodoflex. She was in for a check of her compression the day after we wrapped her last week this was adjusted but nothing much was found 5/13; no major change in the condition or area of the wounds. I was able to get a fairly aggressive debridement done on the lateral left leg wound. Even using Sorbact under compression. She came back in on Friday to have the wrap changed. She says she felt uncomfortable on the lateral aspect of  her ankle. She has a long history of chronic venous insufficiency including previous ablation surgery on this side. 5/20-Patient returns for wounds on left leg with both wounds covered in slough, with the lateral leg wound larger in size, she has been in 3 layer compression and felt more comfortable, she describes pain in ankle, in leg and pins and needles in foot, and is about to try Pamelor for this 6/3; wounds on the left lateral and left medial leg. The area medially which is the most recent of the 2 seems to have had the largest increase in dimensions. We have been using Sorbac to try and debride the surface. She has been to see orthopedics they apparently did a plain x-ray that was indeterminant. Diagnosed her with neuropathy and they have ordered an MRI to determine if there is underlying osteomyelitis. This was not high on my thought list but I suppose it is prudent. We have advised her to make an appointment with vein and vascular in [place]. She has a history of a left greater saphenous and accessory vein ablations I wonder if there is anything else that can be done from a surgical point of view to help in these difficult refractory wounds. We have previously healed this wound on one occasion but it keeps on reopening [medial side] 6/10; deep tissue culture I did last week I think on the left medial wound showed both moderate E. coli and moderate staph aureus [MSSA]. She is going to require antibiotics and I have chosen Augmentin. We have been using Sorbact and we have made better looking wound surface on both sides but certainly no improvement in wound area. She was back in last Friday apparently for a dressing changes the wrap was hurting her outer left ankle. She has not managed to get a hold of vein and vascular in [place]. We are going to have to make her that appointment 6/17; patient is tolerating the Augmentin. She had an MRI that I think was ordered by orthopedic surgeon this  did not show osteomyelitis or an abscess did suggest cellulitis. We have been using Sorbact to the lateral and medial ankles. We have been trying to arrange a follow-up appointment with vein and vascular in [place] or did her original ablations. We apparently an area sent the request to vein and vascular in [facility] 6/24; patient has completed the Augmentin. We do not yet have a vein and vascular appointment in [place]. I am not sure what the  issue is here we have asked her to call tomorrow. We are using Sorbact. Making some improvements and especially the medial wound. Both surfaces however look better medial and lateral. 7/1; the patient has been in contact with vein and vascular in [place] but has not yet received an appointment. Using Sorbact we have gradually improve the wound surface with no improvement in surface area. She is approved for Apligraf but the wound surface still is not completely viable. She has not had to come in for a dressing change 7/8; the patient has an appointment with vein and vascular on 7/31 which is a Friday afternoon. She is concerned about getting back here for [place] to dress her wounds. I think it is important to have them goal for her venous reflux/history of ablations etc. to see if anything else can be done. She apparently tested positive for 1 of the blood tests with regards to lupus and saw a rheumatologist. He has raised the issue of vasculitis again. I have had this thought in the past however the evidence seems overwhelming that this is a venous reflux etiology. If the rheumatologist tells me there is clinical and laboratory investigation is positive for lupus I will rethink this. 7/15; the patient's wound surfaces are quite a bit better. The medial area which was her original wound now has no depth although the lateral wound which was the more recent area actually appears larger. Both with viable surfaces which is indeed better. Using Sorbact. I  wanted to use Apligraf on her however there is the issue of the vein and vascular appointment on 7/31 at 2:00 in the afternoon which would not allow her to get back to be rewrapped and they would no doubt remove the graft 7/22; the patient's wound surfaces have moderate amount of debris although generally look better. The lateral one is larger with 2 small satellite areas superiorly. We are waiting for her vein and vascular appointment on 7/31. She has been approved for Apligraf which I would like to use after th 7/29; wound surfaces have improved no debridement is required we have been using Sorbact. She sees vein and vascular on Friday with this so question of whether anything can be done to lessen the likelihood of recurrence and/or speed the healing of these areas. She is already had previous ablations. She no doubt has severe venous hypertension 8/5-Patient returns at 1 week, she was in [place] for 3 days by her podiatrist, we have been using so backed to the wound, she has increased pain in both the wounds on the left lower leg especially the more distal one on the lateral aspect 8/12-Patient returns at 1 week and she is agreeable to having debridement in both wounds on her left leg today. We have been using Sorbact, and vascular studies were reviewed at last visit 8/19; the patient arrives with her wounds fairly clean and no debridement is required. We have used Sorbact which is really done a nice job in cleaning up these very difficult wound surfaces. The patient saw Dr. Donzetta Matters of vascular surgery on 7/31. He did not feel that there was an arterial component. He felt that her treated greater saphenous vein is adequately addressed and that the small saphenous vein did not appear to be involved significantly. She was also noted to have deep venous reflux which is not treatable. Dr. Donzetta Matters mentioned the possibility of a central obstructive component leading to reflux and he offered her central  venography. She wanted to discuss  this or think about it. I have urged her to go ahead with this. She has had recurrent difficult wounds in these areas which do heal but after months in the clinic. If there is anything that can be done to reduce the likelihood of this I think it is worth it. 9/2 she is still working towards getting follow-up with Dr. Donzetta Matters to schedule her CT. Things are quite a bit worse venography. I put Apligraf on 2 weeks ago on both wounds on the medial and lateral part of her left lower leg. She arrives in clinic today with 3 superficial additional wounds above the area laterally and one below the wound medially. She describes a lot of discomfort. I think these are probably wrapped injuries. Does not look like she has cellulitis. 07/20/2019 on evaluation today patient appears to be doing somewhat poorly in regard to her lower extremity ulcers. She in fact showed signs of erythema in fact we may even be dealing with an infection at this time. Unfortunately I am unsure if this is just infection or if indeed there may be some allergic reaction that occurred as a result of the Apligraf application. With that being said that would be unusual but nonetheless not impossible in this patient is one who is unfortunately allergic to quite a bit. Currently we have been using the Sorbact which seems to do as well as anything for her. I do think we may want to obtain a culture today to see if there is anything showing up there that may need to be addressed. 9/16; noted that last week the wounds look worse in 1 week follow-up of the Apligraf. Using Sorbact as of 2 days ago. She arrives with copious amounts of drainage and new skin breakdown on the back of the left calf. The wounds arm more substantial bilaterally. There is a fair amount of swelling in the left calf no overt DVT there is edema present I think in the left greater than right thigh. She is supposed to go on 9/28 for CT KYLIANNA, GREENAWALT. ([id number]) venography. The wounds on the medial and lateral calf are worse and she has new skin breakdown posteriorly at least new for me. This is almost developing into a circumferential wound area The Apligraf was taken off last week which I agree with things are not going in the right direction a culture was done we do not have that back yet. She is on Augmentin that she started 2 days ago 9/23; dressing was changed by her nurses on Monday. In general there is no improvement in the wound areas although the area looks less angry than last week. She did get Augmentin for MSSA cultured on the 14th. She still appears to have too much swelling in the left leg even with 3 layer compression 9/30; the patient underwent her procedure on 9/28 by Dr. Donzetta Matters at vascular and vein specialist. She was discovered to have the common iliac vein measuring 12.2 mm but at the level of L4-L5 measured 3 mm. After stenting it measured 10 mm. 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 Rouleaux flow in the common femoral and femoral vein was observed much improved after stenting. We are using silver alginate to the wounds on the medial and lateral ankle on the left. 4 layer compression 10/7; the patient had fluid swelling around her knee and 4 layer compression. At the advice of vein and vascular this was reduced to 3 layer which she is  tolerating better. We have been using silver alginate under 3 layer compression since last Friday 10/14; arrives with the areas on the left ankle looking a lot better. Inflammation in the area also a lot better. She came in for a nurse check on 10/9 10/21; continued nice improvement. Slight improvements in surface area of both the medial and lateral wounds on the left. A lot of the satellite lesions in the weeping erythema around these from stasis dermatitis is resolved. We have been using silver alginate 10/28; general improvement in the entire wound areas although not a  lot of change in dimensions the wound certainly looks better. There is a lot less in terms of venous inflammation. Continue silver alginate this week however look towards Hydrofera Blue next week 11/4; very adherent debris on the medial wound left wound is not as bad. We have been using silver alginate. Change to [facility] today 11/11; very adherent debris on both wound areas. She went to vein and vascular last week and follow-up they put in [place] boot on this today. He says the [facility] was adherent. Wound is definitely not as good as last week. Especially on the left there the satellite lesions look more prominent 11/18; absolutely no better. erythema on lateral aspect with tenderness. 09/30/2019 on evaluation today patient appears to actually be doing better. Dr. Dellia Nims did put her on doxycycline last week which I do believe has helped her at this point. Fortunately there is no signs of active infection at this time. No fevers, chills, nausea, vomiting, or diarrhea. I do believe he may want extend the doxycycline for 7 additional days just to ensure everything does completely cleared up the patient is in agreement with that plan. Otherwise she is going require some sharp debridement today 12/2; patient is completing a 2-week course of doxycycline. I gave her this empirically for inflammation as well as infection when I last saw her 2 weeks ago. All of this seems to be better. She is using silver alginate she has the area on the medial aspect of the larger area laterally and the 2 small satellite regions laterally above the major wound. 12/9; the patient's wound on the left medial and left lateral calf look really quite good. We have been using silver alginate. She saw vein and vascular in follow-up on 10/09/2019. She has had a previous left greater saphenous vein ablation by Dr. Oscar La in 2016. More recently she underwent a left common iliac vein stent by Dr. Donzetta Matters on 08/04/2019 due to May  Thurner type lesions. The swelling is improved and certainly the wounds have improved. The patient shows [place] today area on the right medial calf there is almost no wound but leaking lymphedema. She says she start this started 3 or 4 days ago. She did not traumatize it. It is not painful. She does not wear compression on that side 12/16; the patient continues to do well laterally. Medially still requiring debridement. The area on the right calf did not materialize to anything and is not currently open. We wrapped this last time. She has support stockings for that leg although I am not sure they are going to provide adequate compression 12/23; the lateral wound looks stable. Medially still requiring debridement for tightly adherent fibrinous debris. We've been using silver alginate. Surface area not any different 12/30; neither wound is any better with regards to surface and the area on the left lateral is larger. I been using silver alginate to the left lateral which  look quite good last week and Sorbact to the left medial 11/11/2019. Lateral wound area actually looks better and somewhat smaller. Medial still requires a very aggressive debridement today. We have been using Sorbact on both wound areas 1/13; not much better still adherent debris bilaterally. I been using Sorbact. She has severe venous hypertension. Probably some degree of dermal fibrosis distally. I wonder whether tighter compression might help and I am going to try that today. We also need to work on the bioburden 1/20; using Sorbact. She has severe venous hypertension status post stent placement for pelvic vein compression. We applied gentamicin last time to see if we could reduce bioburden I had some discussion with her today about the use of pentoxifylline. This is occasionally used in this setting for wounds with refractory venous insufficiency. However this interacts with Plavix. She tells me that she was put on this after stent  placement for 3 months. She will call Dr. Claretha Cooper office to discuss 1/27; we are using gentamicin under Sorbact. 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 She has a stent. Wound medially is measuring smaller this week. Laterally measuring slightly larger although she has some satellite lesions superiorly 2/3; gentamicin under Sorbact under 4-layer compression. 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 She has a stent on Plavix. Her wounds are measuring smaller this week. More substantially laterally where there is a satellite lesion superiorly. 2/10; gentamicin under Sorbac. 4-layer compression. Patient communicated with Dr. Donzetta Matters at vein and vascular in [employer]. He is okay with the patient coming off Plavix I will therefore start her on pentoxifylline for a 1 month trial. In general her wounds look better today. I had some concerns about swelling in the left thigh however she measures 61.5 on the right and 63 on the mid thigh which does not suggest there is any difficulty. The patient is not describing any pain. 2/17; gentamicin under Sorbac 4-layer compression. She has been on pentoxifylline for 1 week and complains of loose stool. No nausea she is eating and drinking well 2/24; the patient apparently came in 2 days ago for a nurse visit when her wrap fell down. Both areas look a little worse this week macerated medially and satellite lesions laterally. Change to silver alginate today 3/3; wounds are larger today especially medially. She also has more swelling in her foot lower leg and I even noted some swelling in her posterior thigh which is tender. I wonder about the patency of her stent. Fortuitously she sees Dr. Claretha Cooper group on Friday 3/10; Mrs. Meek was seen by vein and vascular on 3/5. The patient underwent ultrasound. There was no evidence of thrombosis involving the IVC no evidence of thrombosis involving the right common iliac vein  there is no evidence of thrombosis involving the right external iliac vein the left external Wallner, Gloriann J. ([id number]) vein is also patent. The right common iliac vein stent appears patent bilateral common femoral veins are compressible and appear patent. I was concerned about the left common iliac stent however it looks like this is functional. She has some edema in the posterior thigh that was tender she still has that this week. I also note they had trouble finding the pulses in her left foot and booked her for an ABI baseline in 4 weeks. She will follow up in 6 months for repeat IVC duplex. The patient stopped the pentoxifylline because of diarrhea. It does not look like that was being effective in any case. I  have advised her to go back on her aspirin 81 mg tablet, vascular it also suggested this Objective Constitutional Vitals Time Taken: 12:50 PM, Height: 63 in, Weight: 224.7 lbs, BMI: 39.8, Temperature: 98.4 F, Pulse: 80 bpm, Respiratory Rate: 18 breaths/min, Blood Pressure: 158/80 mmHg. Cardiovascular Both dorsalis pedis and posterior tibial pulses in the left foot are easily palpable. Edema control is excellent. General Notes: Wound exam; both sides medial and lateral are larger but more superficial and less angry. Less irritated than last week. Integumentary (Hair, Skin) Wound #5 status is Open. Original cause of wound was Gradually Appeared. The wound is located on the Left,Medial Lower Leg. The wound measures 4.5cm length x 3.3cm width x 0.2cm depth; 11.663cm^2 area and 2.333cm^3 volume. There is Fat Layer (Subcutaneous Tissue) Exposed exposed. There is a medium amount of serosanguineous drainage noted. There is large (67-100%) red granulation within the wound bed. There is a small (1-33%) amount of necrotic tissue within the wound bed including Adherent Slough. Wound #6 status is Open. Original cause of wound was Gradually Appeared. The wound is located on the Left,Lateral  Lower Leg. The wound measures 4.5cm length x 3.5cm width x 0.2cm depth; 12.37cm^2 area and 2.474cm^3 volume. There is Fat Layer (Subcutaneous Tissue) Exposed exposed. There is no tunneling or undermining noted. There is a medium amount of serosanguineous drainage noted. There is medium (34-66%) red granulation within the wound bed. There is a medium (34-66%) amount of necrotic tissue within the wound bed including Adherent Slough. Wound #9 status is Open. Original cause of wound was Gradually Appeared. The wound is located on the Left,Lateral,Posterior Lower Leg. The wound measures 1cm length x 0.6cm width x 0.1cm depth; 0.471cm^2 area and 0.047cm^3 volume. There is Fat Layer (Subcutaneous Tissue) Exposed exposed. There is no tunneling or undermining noted. There is a medium amount of serosanguineous drainage noted. There is medium (34-66%) red granulation within the wound bed. There is a medium (34-66%) amount of necrotic tissue within the wound bed including Adherent Slough. 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 There was a Four Layer Compression Therapy BECCA, VLASIC ([id number]) Procedure with a pre-treatment ABI of 1 by Cornell Barman, RN. Post procedure Diagnosis Wound #5: Same as Pre-Procedure Plan Wound Cleansing: Wound #5 Left,Medial Lower Leg: anasept - in clinic Wound #6 Left,Lateral Lower Leg: anasept - in clinic Wound #9 Left,Lateral,Posterior Lower Leg: anasept - in clinic Anesthetic (add to Medication List): Wound #5 Left,Medial Lower Leg: Topical Lidocaine 4% cream applied to wound bed  prior to debridement (In Clinic Only). Wound #6 Left,Lateral Lower Leg: Topical Lidocaine 4% cream applied to wound bed prior to debridement (In Clinic Only). Wound #9 Left,Lateral,Posterior Lower Leg: Topical Lidocaine 4% cream applied to wound bed prior to debridement (In Clinic Only). Skin Barriers/Peri-Wound Care: Wound #5 Left,Medial Lower Leg: Triamcinolone Acetonide Ointment (TCA) Wound #6 Left,Lateral Lower Leg: Triamcinolone Acetonide Ointment (TCA) Wound #9 Left,Lateral,Posterior Lower Leg: Triamcinolone Acetonide Ointment (TCA) Primary Wound Dressing: Wound #5 Left,Medial Lower Leg: Alginate Wound #6 Left,Lateral Lower Leg: Alginate Wound #9 Left,Lateral,Posterior Lower Leg: Alginate Secondary Dressing: Wound #5 Left,Medial Lower Leg: Drawtex Wound #6 Left,Lateral Lower Leg: Drawtex Wound #9 Left,Lateral,Posterior Lower Leg:  Drawtex Dressing Change Frequency: Wound #5 Left,Medial Lower Leg: Change dressing every week Other: - Friday after vascular appointment Wound #6 Left,Lateral Lower Leg: Change dressing every week Other: - Friday after vascular appointment Wound #9 Left,Lateral,Posterior Lower Leg: Change dressing every week Other: - Friday after vascular appointment Follow-up Appointments: Wound #5 Left,Medial Lower Leg: Return Appointment in 1 week. Nurse Visit as needed - Friday after vascular appointment Wound #6 Left,Lateral Lower Leg: Return Appointment in 1 week. Nurse Visit as needed - Friday after vascular appointment Wound #9 Left,Lateral,Posterior Lower Leg: Return Appointment in 1 week. Nurse Visit as needed - Friday after vascular appointment Edema Control: Wound #5 Left,Medial Lower Leg: 4-Layer Compression System - Left Lower Extremity. Elevate legs to the level of the heart and pump ankles as often as possible Wound #6 Left,Lateral Lower Leg: 4-Layer Compression System - Left Lower Extremity. NALAYA, PETTAWAY ([id number]) Elevate legs  to the level of the heart and pump ankles as often as possible Wound #9 Left,Lateral,Posterior Lower Leg: 4-Layer Compression System - Left Lower Extremity. Elevate legs to the level of the heart and pump ankles as often as possible Medications-please add to medication list.: Wound #5 Left,Medial Lower Leg: Other: - Continue Pentoxifylline Wound #6 Left,Lateral Lower Leg: Other: - Continue Pentoxifylline 1. We continued with silver alginate with TCA rather than topical gentamicin 2. Still under the same compression 3. It is good to know that this stent in the left common iliac vein is patent and the other central veins looked patent as well Electronic Signature(s) Signed: 01/13/2020 5:25:14 PM By: Linton Ham MD Entered By: Linton Ham on 01/13/2020 13:38:31 Gorelik, [name] -------------------------------------------------------------------------------- SuperBill Details Patient Name: MARISELLA, SAGGIO. Date of Service: 01/13/2020 Medical Record Number: [id number] Patient Account Number: [id number] Date of Birth/Sex: [birth year] (74 y.o. 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 Facility Procedures CPT4 Code: [id number] Description: (Facility Use Only) [id number] - Branch LWR LT LEG Modifier: Quantity: 1 Physician Procedures CPT4 [id number] Description: [id number] - WC PHYS LEVEL 3 - EST PT Modifier: Quantity: 1 CPT4 Code: Description: ICD-10  Diagnosis Description L97.221 Non-pressure chronic ulcer of left calf limited to breakdown of skin I87.332 Chronic venous hypertension (idiopathic) with ulcer and inflammation of left l I89.0 Lymphedema, not elsewhere classified Modifier: ower extremity Quantity: Electronic Signature(s) Signed: 01/13/2020 5:25:14 PM By: Linton Ham MD Entered By: Linton Ham on 01/13/2020 13:38:56

## 2020-01-14 ENCOUNTER — Encounter: Payer: Self-pay | Admitting: Podiatry

## 2020-01-14 ENCOUNTER — Ambulatory Visit (INDEPENDENT_AMBULATORY_CARE_PROVIDER_SITE_OTHER): Payer: Medicare Other | Admitting: Podiatry

## 2020-01-14 VITALS — Temp 98.6°F

## 2020-01-14 DIAGNOSIS — B351 Tinea unguium: Secondary | ICD-10-CM

## 2020-01-14 DIAGNOSIS — I872 Venous insufficiency (chronic) (peripheral): Secondary | ICD-10-CM

## 2020-01-14 DIAGNOSIS — M79675 Pain in left toe(s): Secondary | ICD-10-CM

## 2020-01-14 DIAGNOSIS — M79674 Pain in right toe(s): Secondary | ICD-10-CM | POA: Diagnosis not present

## 2020-01-14 NOTE — Progress Notes (Signed)
BIANCHA, KRESSE ([id number]) Visit Report for 01/13/2020 Arrival Information Details Patient Name: [name], [name]. Date of Service: 01/13/2020 12:45 PM Medical Record Number: [id number] Patient Account Number: [id number] Date of Birth/Sex: February 09, 1946 (74 y.o. F) Treating [name]: [name] Primary Care [name]: [name] Other Clinician: Referring [name]: [name] Treating [name]/Extender: [name] in Treatment: 71 Visit Information History Since Last Visit Added or deleted any medications: Yes Patient Arrived: Ambulatory Any new allergies or adverse reactions: No Arrival Time: 12:52 Had a fall or experienced change in No Accompanied By: self activities of daily living that may affect Transfer Assistance: None risk of falls: Patient Identification Verified: Yes Signs or symptoms of abuse/neglect since last visito No Secondary Verification Process Completed: Yes Hospitalized since last visit: No Patient Requires Transmission-Based No Implantable device outside of the clinic excluding No Precautions: cellular tissue based products placed in the center Patient Has Alerts: Yes since last visit: Patient Alerts: Patient on Blood Has Dressing in Place as Prescribed: Yes Thinner Has Compression in Place as Prescribed: Yes aspirin 81 Pain Present Now: No Electronic Signature(s) Signed: 01/13/2020 4:48:03 PM By: [name] RCP, RRT, CHT Entered By: [name] on 01/13/2020 12:53:27 [name], [name] -------------------------------------------------------------------------------- Compression Therapy Details Patient Name: [name], [name]. Date of Service: 01/13/2020 12:45 PM Medical Record Number: [id number] Patient Account Number: [id number] Date of Birth/Sex: February 19, 1946 (74 y.o. F) Treating [name]: [name] Primary Care [name]: [name] Other Clinician: Referring [name]: [name] Treating Tylik [name]/Extender: [name] in Treatment: 29 Compression Therapy Performed for Wound Assessment: Wound #5 Left,Medial Lower Leg Performed By: Clinician Amber Barman, [name] Compression Type: Four Layer Pre Treatment ABI: 1 Post Procedure Diagnosis Same as Pre-procedure Electronic Signature(s) Signed: 01/13/2020 5:22:30 PM By: [name], [name], [name], CWS, [name], [name] Entered By: [name], [name], [name], CWS, Amber on 01/13/2020 13:18:24 [name] -------------------------------------------------------------------------------- Encounter Discharge Information Details Patient Name: [name], [name]. Date of Service: 01/13/2020 12:45 PM Medical Record Number: [id number] Patient Account Number: [id number] Date of Birth/Sex: 1946/03/11 (74 y.o. F) Treating [name]: [name] Primary Care Colen Eltzroth: [name] Other Clinician: Referring [name]: [name] Treating [name]/Extender: [name] in Treatment: 18 Encounter Discharge Information Items Discharge Condition: Stable Ambulatory Status: Ambulatory Discharge Destination: Home Transportation: Private Auto Accompanied By: self Schedule Follow-up Appointment: Yes Clinical Summary of Care: Electronic Signature(s) Signed: 01/13/2020 5:22:30 PM By: [name], [name], [name], CWS, [name], [name] Entered By: [name], [name], [name], CWS, Amber on 01/13/2020 13:20:45 [name] -------------------------------------------------------------------------------- Lower Extremity Assessment Details Patient Name: [name], [name]. Date of Service: 01/13/2020 12:45 PM Medical Record Number: [id number] Patient Account Number: [id number] Date of Birth/Sex: 1946/09/25 (74 y.o. F) Treating [name]: [name] Primary Care [name]: [name] Other Clinician: Referring Ralph Brouwer: [name] Treating [name]/Extender: [name] in Treatment: 57 Edema Assessment Assessed: [Left: No] [Right:  No] Edema: [Left: Ye] [Right: s] Calf Left: Right: Point of Measurement: 33 cm From Medial Instep 43 cm cm Ankle Left: Right: Point of Measurement: 10 cm From Medial Instep 23 cm cm Vascular Assessment Pulses: Dorsalis Pedis Palpable: [Left:Yes] Electronic Signature(s) Signed: 01/14/2020 9:13:28 AM By: [name] Entered By: [name] on 01/13/2020 12:55:49 [name], [name] -------------------------------------------------------------------------------- Multi Wound Chart Details Patient Name: [name], [name]. Date of Service: 01/13/2020 12:45 PM Medical Record Number: [id number] Patient Account Number: [id number] Date of Birth/Sex: 07-25-1946 (74 y.o. F) Treating [name]: [name] Primary Care [name]: [name] Other  Clinician: Referring [name]: [name] Treating [name]/Extender: [name] in Treatment: 82 Vital Signs Height(in): 93 Pulse(bpm): 31 Weight(lbs): 224.7 Blood Pressure(mmHg): 158/80 Body Mass Index(BMI): 40 Temperature(F): 98.4 Respiratory Rate(breaths/min): 18 Photos: Wound Location: Left Lower Leg - Medial Left Lower Leg - Lateral Left Lower Leg - Lateral, Posterior Wounding Event: Gradually Appeared Gradually Appeared Gradually Appeared Primary Etiology: Lymphedema Venous Leg Ulcer Venous Leg Ulcer Comorbid History: Cataracts, Asthma, Sleep Apnea, Cataracts, Asthma, Sleep Apnea, Cataracts, Asthma, Sleep Apnea, Deep Vein Thrombosis, Deep Vein Thrombosis, Deep Vein Thrombosis, Hypertension, Peripheral Venous Hypertension, Peripheral Venous Hypertension, Peripheral Venous Disease, Osteoarthritis, Received Disease, Osteoarthritis, Received Disease, Osteoarthritis, Received Chemotherapy, Received Radiation Chemotherapy, Received Radiation Chemotherapy, Received Radiation Date Acquired: 11/19/2018 01/19/2019 07/08/2019 Weeks of Treatment: 57 51 27 Wound Status: Open Open Open Measurements L x W x D (cm) 4.5x3.3x0.2  4.5x3.5x0.2 1x0.6x0.1 Area (cm) : 11.663 12.37 0.471 Volume (cm) : 2.333 2.474 0.047 % Reduction in Area: -36.50% -5522.70% 16.60% % Reduction in Volume: -172.90% -11145.50% 17.50% Classification: Full Thickness Without Exposed Full Thickness Without Exposed Full Thickness Without Exposed Support Structures Support Structures Support Structures Exudate Amount: Medium Medium Medium Exudate Type: Serosanguineous Serosanguineous Serosanguineous Exudate Color: red, brown red, brown red, brown Granulation Amount: Large (67-100%) Medium (34-66%) Medium (34-66%) Granulation Quality: Red Red Red Necrotic Amount: Small (1-33%) Medium (34-66%) Medium (34-66%) Exposed Structures: Fat Layer (Subcutaneous Tissue) Fat Layer (Subcutaneous Tissue) Fat Layer (Subcutaneous Tissue) Exposed: Yes Exposed: Yes Exposed: Yes Fascia: No Fascia: No Fascia: No Tendon: No Tendon: No Tendon: No Muscle: No Muscle: No Muscle: No Joint: No Joint: No Joint: No Bone: No Bone: No Bone: No Epithelialization: N/A None None Procedures Performed: Compression Therapy N/A N/A Treatment Notes Wound #5 (Left, Medial Lower Leg) Rosol, Katerine J. ([id number]) Notes s. cell, Drawtext, ABD, 4 layer Wound #6 (Left, Lateral Lower Leg) Notes s. cell, Drawtext, ABD, 4 layer Wound #9 (Left, Lateral, Posterior Lower Leg) Notes s. cell, Drawtext, ABD, 4 layer Electronic Signature(s) Signed: 01/13/2020 5:25:14 PM By: Linton Ham MD Entered By: Linton Ham on 01/13/2020 13:22:17 [name] -------------------------------------------------------------------------------- Multi-Disciplinary Care Plan Details Patient Name: [name], [name]. Date of Service: 01/13/2020 12:45 PM Medical Record Number: [id number] Patient Account Number: [id number] Date of Birth/Sex: May 02, 1946 (74 y.o. F) Treating [name]: [name] Primary Care Elisheva Fallas: [name] Other Clinician: Referring Jimmy Stipes: [name] Treating Sonna Lipsky/Extender: [name] in Treatment: 27 Active Inactive Medication Nursing Diagnoses: Knowledge deficit related to medication safety: actual or potential Goals: Patient/caregiver will demonstrate understanding of new oral/IV medications prescribed at the [facility] (topical prescriptions are covered under the skin breakdown problem) Date Initiated: 12/16/2019 Target Resolution Date: 01/13/2020 Goal Status: Active Interventions: Assess for medication contraindications each visit where new medications are prescribed Treatment Activities: New medication prescribed at [place] : 12/16/2019 Notes: Soft Tissue Infection Nursing Diagnoses: Impaired tissue integrity Goals: Patient's soft tissue infection will resolve Date Initiated: 12/10/2018 Target Resolution Date: 01/09/2019 Goal Status: Active Interventions: Assess signs and symptoms of infection every visit Notes: Venous Leg Ulcer Nursing Diagnoses: Actual venous Insuffiency (use after diagnosis is confirmed) Goals: Patient will maintain optimal edema control Date Initiated: 12/10/2018 Target Resolution Date: 01/09/2019 Goal Status: Active Interventions: Assess peripheral edema status every visit. Treatment Activities: Therapeutic compression applied : 12/10/2018 [name], [name] Notes: Wound/Skin Impairment Nursing Diagnoses: Impaired tissue integrity Goals: Patient/caregiver will verbalize understanding of skin care regimen Date Initiated: 12/10/2018 Target Resolution Date: 01/09/2019 Goal Status: Active Interventions: Assess ulceration(s) every visit Treatment Activities:  Topical wound management initiated : 12/10/2018 Notes: Electronic Signature(s) Signed: 01/13/2020 5:22:30 PM By: [name], [name], [name], CWS, [name], [name] Entered By: [name], [name], [name], CWS, Amber on 01/13/2020 13:12:25 [name], [name]  ([id number]) -------------------------------------------------------------------------------- Pain Assessment Details Patient Name: [name], [name]. Date of Service: 01/13/2020 12:45 PM Medical Record Number: [id number] Patient Account Number: [id number] Date of Birth/Sex: April 10, 1946 (74 y.o. F) Treating [name]: [name] Primary Care Yarithza Mink: [name] Other Clinician: Referring Shanikka Wonders: [name] Treating Jeannette Maddy/Extender: [name] in Treatment: 73 Active Problems Location of Pain Severity and Description of Pain Patient Has Paino No Site Locations Pain Management and Medication Current Pain Management: Electronic Signature(s) Signed: 01/14/2020 9:13:28 AM By: [name] Entered By: [name] on 01/13/2020 12:55:32 Boswell, [name] -------------------------------------------------------------------------------- Patient/Caregiver Education Details Patient Name: [name], [name]. Date of Service: 01/13/2020 12:45 PM Medical Record Number: [id number] Patient Account Number: [id number] Date of Birth/Gender: Mar 28, 1946 (74 y.o. F) Treating [name]: [name] Primary Care Physician: [name] Other Clinician: Referring Physician: Ria [name] Treating Physician/Extender: [name] in Treatment: 63 Education Assessment Education Provided To: Patient Education Topics Provided Venous: Handouts: Controlling Swelling with Multilayered Compression Wraps Methods: Demonstration, Explain/Verbal Responses: State content correctly Wound/Skin Impairment: Handouts: Caring for Your Ulcer Methods: Demonstration, Explain/Verbal Responses: State content correctly Electronic Signature(s) Signed: 01/13/2020 5:22:30 PM By: [name], [name], [name], CWS, [name], [name] Entered By: [name], [name], [name], CWS, Amber on 01/13/2020 13:19:50 [name] -------------------------------------------------------------------------------- Wound  Assessment Details Patient Name: [name], [name]. Date of Service: 01/13/2020 12:45 PM Medical Record Number: [id number] Patient Account Number: [id number] Date of Birth/Sex: 14-Dec-1945 (74 y.o. F) Treating [name]: [name] Primary Care Val Schiavo: [name] Other Clinician: Referring Eilene Voigt: [name] Treating Willer Osorno/Extender: [name] in Treatment: 64 Wound Status Wound Number: 5 Primary Lymphedema Etiology: Wound Location: Left Lower Leg - Medial Wound Open Wounding Event: Gradually Appeared Status: Date Acquired: 11/19/2018 Comorbid Cataracts, Asthma, Sleep Apnea, Deep Vein Thrombosis, Weeks Of Treatment: 57 History: Hypertension, Peripheral Venous Disease, Osteoarthritis, Clustered Wound: No Received Chemotherapy, Received Radiation Photos Wound Measurements Length: (cm) 4.5 Width: (cm) 3.3 Depth: (cm) 0.2 Area: (cm) 11.663 Volume: (cm) 2.333 % Reduction in Area: -36.5% % Reduction in Volume: -172.9% Wound Description Classification: Full Thickness Without Exposed Support Struct Exudate Amount: Medium Exudate Type: Serosanguineous Exudate Color: red, brown ures Foul Odor After Cleansing: No Slough/Fibrino Yes Wound Bed Granulation Amount: Large (67-100%) Exposed Structure Granulation Quality: Red Fascia Exposed: No Necrotic Amount: Small (1-33%) Fat Layer (Subcutaneous Tissue) Exposed: Yes Necrotic Quality: Adherent Slough Tendon Exposed: No Muscle Exposed: No Joint Exposed: No Bone Exposed: No Treatment Notes Wound #5 (Left, Medial Lower Leg) Notes s. cell, Drawtext, ABD, 4 layer Electronic Signature(s) Signed: 01/14/2020 9:13:28 AM By: Renee Rival ([id number]) Entered By: [name] on 01/13/2020 12:58:24 Lilley, [name] -------------------------------------------------------------------------------- Wound Assessment Details Patient Name: [name], [name]. Date of Service: 01/13/2020 12:45  PM Medical Record Number: [id number] Patient Account Number: [id number] Date of Birth/Sex: 22-Oct-1946 (74 y.o. F) Treating [name]: [name] Primary Care Fleda Pagel: [name] Other Clinician: Referring Gracieann Stannard: [name] Treating Kiree Dejarnette/Extender: [name] in Treatment: 56 Wound Status Wound Number: 6 Primary Venous Leg Ulcer Etiology: Wound Location: Left Lower Leg - Lateral Wound Open Wounding Event: Gradually Appeared Status: Date Acquired: 01/19/2019 Comorbid Cataracts, Asthma, Sleep Apnea, Deep Vein Thrombosis, Weeks Of Treatment: 51 History: Hypertension, Peripheral Venous Disease, Osteoarthritis, Clustered Wound: No Received Chemotherapy, Received Radiation Photos  Wound Measurements Length: (cm) 4.5 Width: (cm) 3.5 Depth: (cm) 0.2 Area: (cm) 12.37 Volume: (cm) 2.474 % Reduction in Area: -5522.7% % Reduction in Volume: -11145.5% Epithelialization: None Tunneling: No Undermining: No Wound Description Classification: Full Thickness Without Exposed Support Struct Exudate Amount: Medium Exudate Type: Serosanguineous Exudate Color: red, brown ures Foul Odor After Cleansing: No Slough/Fibrino Yes Wound Bed Granulation Amount: Medium (34-66%) Exposed Structure Granulation Quality: Red Fascia Exposed: No Necrotic Amount: Medium (34-66%) Fat Layer (Subcutaneous Tissue) Exposed: Yes Necrotic Quality: Adherent Slough Tendon Exposed: No Muscle Exposed: No Joint Exposed: No Bone Exposed: No Treatment Notes Wound #6 (Left, Lateral Lower Leg) Notes s. cell, Drawtext, ABD, 4 layer Electronic Signature(s) Signed: 01/14/2020 9:13:28 AM By: Renee Rival ([id number]) Entered By: [name] on 01/13/2020 12:58:58 Sykora, [name] -------------------------------------------------------------------------------- Wound Assessment Details Patient Name: [name], DEVOTO. Date of Service: 01/13/2020 12:45 PM Medical  Record Number: [id number] Patient Account Number: [id number] Date of Birth/Sex: 06/20/1946 (74 y.o. F) Treating [name]: [name] Primary Care Jyrah Blye: [name] Other Clinician: Referring Delilah Mulgrew: [name] Treating Kaianna Dolezal/Extender: [name] in Treatment: 56 Wound Status Wound Number: 9 Primary Venous Leg Ulcer Etiology: Wound Location: Left Lower Leg - Lateral, Posterior Wound Open Wounding Event: Gradually Appeared Status: Date Acquired: 07/08/2019 Comorbid Cataracts, Asthma, Sleep Apnea, Deep Vein Thrombosis, Weeks Of Treatment: 27 History: Hypertension, Peripheral Venous Disease, Osteoarthritis, Clustered Wound: No Received Chemotherapy, Received Radiation Photos Wound Measurements Length: (cm) 1 Width: (cm) 0.6 Depth: (cm) 0.1 Area: (cm) 0.471 Volume: (cm) 0.047 % Reduction in Area: 16.6% % Reduction in Volume: 17.5% Epithelialization: None Tunneling: No Undermining: No Wound Description Classification: Full Thickness Without Exposed Support Struct Exudate Amount: Medium Exudate Type: Serosanguineous Exudate Color: red, brown ures Slough/Fibrino Yes Wound Bed Granulation Amount: Medium (34-66%) Exposed Structure Granulation Quality: Red Fascia Exposed: No Necrotic Amount: Medium (34-66%) Fat Layer (Subcutaneous Tissue) Exposed: Yes Necrotic Quality: Adherent Slough Tendon Exposed: No Muscle Exposed: No Joint Exposed: No Bone Exposed: No Treatment Notes Wound #9 (Left, Lateral, Posterior Lower Leg) Notes s. cell, Drawtext, ABD, 4 layer Electronic Signature(s) Signed: 01/14/2020 9:13:28 AM By: Renee Rival ([id number]) Entered By: [name] on 01/13/2020 12:59:34 Renfroe, [name] -------------------------------------------------------------------------------- Vitals Details Patient Name: MAKALIE, BOGUE. Date of Service: 01/13/2020 12:45 PM Medical Record Number: [id number] Patient Account  Number: [id number] Date of Birth/Sex: Jul 21, 1946 (74 y.o. F) Treating [name]: [name] Primary Care Lizandro Spellman: [name] Other Clinician: Referring Latayvia Mandujano: [name] Treating Caedence Snowden/Extender: [name] in Treatment: 57 Vital Signs Time Taken: 12:50 Temperature (F): 98.4 Height (in): 63 Pulse (bpm): 80 Weight (lbs): 224.7 Respiratory Rate (breaths/min): 18 Body Mass Index (BMI): 39.8 Blood Pressure (mmHg): 158/80 Reference Range: 80 - 120 mg / dl Electronic Signature(s) Signed: 01/13/2020 4:48:03 PM By: [name] RCP, RRT, CHT Entered By: [name] on 01/13/2020 12:53:51

## 2020-01-14 NOTE — Progress Notes (Signed)
This patient returns to my office for at risk foot care.  This patient requires this care by a professional since this patient will be at risk due to having chronic venous insufficiency.  Patient is wearing an unna boot and is under care from the wound center.  Patient is taking trental.This patient is unable to cut nails themselves since the patient cannot reach their nails.These nails are painful walking and wearing shoes.  This patient presents for at risk foot care today.  General Appearance  Alert, conversant and in no acute stress.  Vascular  Dorsalis pedis and posterior tibial  pulses are palpable  bilaterally.  Capillary return is within normal limits  bilaterally. Temperature is within normal limits  Bilaterally. Swelling and ankle.  Neurologic  Senn-Weinstein monofilament wire test within normal limits  bilaterally. Muscle power within normal limits bilaterally.  Nails Thick disfigured discolored nails with subungual debris  from hallux to fifth toes bilaterally. No evidence of bacterial infection or drainage bilaterally.  Orthopedic  No limitations of motion  feet .  No crepitus or effusions noted.  No bony pathology or digital deformities noted. DJD [name]  B/L.  Skin  normotropic skin with no porokeratosis noted bilaterally.  No signs of infections or ulcers noted.     Onychomycosis  Pain in right toes  Pain in left toes  Consent was obtained for treatment procedures.   Mechanical debridement of nails 1-5  bilaterally performed with a nail nipper.  Filed with dremel without incident. No infection or ulcer.     Return office visit   9 weeks        Told patient to return for periodic foot care and evaluation due to potential at risk complications.   Gardiner Barefoot DPM

## 2020-01-20 ENCOUNTER — Other Ambulatory Visit: Payer: Self-pay

## 2020-01-20 ENCOUNTER — Encounter: Payer: Medicare Other | Admitting: Internal Medicine

## 2020-01-20 DIAGNOSIS — I82592 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: Secondary | ICD-10-CM | POA: Diagnosis not present

## 2020-01-20 DIAGNOSIS — L97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d: Secondary | ICD-10-CM | POA: Diagnosis not present

## 2020-01-20 DIAGNOSIS — I87332 Chronic venous hypertension (idiopathic) with ulcer and inflammation of left lower extremity: Secondary | ICD-10-CM | POA: Diagnosis not present

## 2020-01-20 DIAGNOSIS — I890 Lymphedema, not elsewhere classified: Secondary | ICD-10-CM | POA: Diagnosis not present

## 2020-01-20 DIAGNOSIS — I872 Venous insufficiency (chronic) (peripheral): Secondary | ICD-10-CM | POA: Diagnosis not present

## 2020-01-20 DIAGNOSIS — I10 Essential (primary) hypertension: Secondary | ICD-10-CM | POA: Diagnosis not present

## 2020-01-20 DIAGNOSIS — J45909 Unspecified asthma, uncomplicated: Secondary | ICD-10-CM | POA: Diagnosis not present

## 2020-01-20 DIAGNOSIS — L97222 Non-pressure chronic ulcer of left calf with fat layer exposed: Secondary | ICD-10-CM | POA: Diagnosis not present

## 2020-01-20 DIAGNOSIS — L97221 Non-pressure chronic ulcer of left calf limited to breakdown of skin: Secondary | ICD-10-CM | POA: Diagnosis not present

## 2020-01-22 ENCOUNTER — Other Ambulatory Visit: Payer: Self-pay

## 2020-01-22 DIAGNOSIS — I87332 Chronic venous hypertension (idiopathic) with ulcer and inflammation of left lower extremity: Secondary | ICD-10-CM | POA: Diagnosis not present

## 2020-01-22 DIAGNOSIS — I872 Venous insufficiency (chronic) (peripheral): Secondary | ICD-10-CM | POA: Diagnosis not present

## 2020-01-22 DIAGNOSIS — I10 Essential (primary) hypertension: Secondary | ICD-10-CM | POA: Diagnosis not present

## 2020-01-22 DIAGNOSIS — L97221 Non-pressure chronic ulcer of left calf limited to breakdown of skin: Secondary | ICD-10-CM | POA: Diagnosis not present

## 2020-01-22 DIAGNOSIS — J45909 Unspecified asthma, uncomplicated: Secondary | ICD-10-CM | POA: Diagnosis not present

## 2020-01-22 DIAGNOSIS — I890 Lymphedema, not elsewhere classified: Secondary | ICD-10-CM | POA: Diagnosis not present

## 2020-01-22 NOTE — Progress Notes (Signed)
SAKIYAH, LEGRANT ([id number]) Visit Report for 01/20/2020 Arrival Information Details Patient Name: [name], [name]. Date of Service: 01/20/2020 12:45 PM Medical Record Number: [id number] Patient Account Number: [id number] Date of Birth/Sex: [birth year] (74 y.o. F) Treating RN: Cornell Barman Primary Care Igor Bishop: [name] Other Clinician: Referring Jamiyla Ishee: [name] Treating Vianny Schraeder/Extender: Tito Dine in Treatment: 2 Visit Information History Since Last Visit Added or deleted any medications: No Patient Arrived: Ambulatory Any new allergies or adverse reactions: No Arrival Time: 12:49 Had a fall or experienced change in No Accompanied By: self activities of daily living that may affect Transfer Assistance: None risk of falls: Patient Identification Verified: Yes Signs or symptoms of abuse/neglect since last visito No Secondary Verification Process Completed: Yes Hospitalized since last visit: No Patient Requires Transmission-Based No Implantable device outside of the clinic excluding No Precautions: cellular tissue based products placed in the center Patient Has Alerts: Yes since last visit: Patient Alerts: Patient on Blood Has Dressing in Place as Prescribed: Yes Thinner Has Compression in Place as Prescribed: Yes aspirin 81 Pain Present Now: No Electronic Signature(s) Signed: 01/20/2020 4:00:59 PM By: Lorine Bears RCP, RRT, CHT Entered By: Lorine Bears on 01/20/2020 12:50:09 [name] -------------------------------------------------------------------------------- Encounter Discharge Information Details Patient Name: [name], [name]. Date of Service: 01/20/2020 12:45 PM Medical Record Number: [id number] Patient Account Number: [id number] Date of Birth/Sex: [birth year] (74 y.o. F) Treating RN: Cornell Barman Primary Care Selicia Windom: [name] Other Clinician: Referring Ahmiya Abee: [name] Treating Shavonda Wiedman/Extender: Tito Dine in Treatment: 73 Encounter Discharge Information Items Discharge Condition: Stable Ambulatory Status: Ambulatory Discharge Destination: Home Transportation: Private Auto Accompanied By: self Schedule Follow-up Appointment: Yes Clinical Summary of Care: Electronic Signature(s) Signed: 01/22/2020 5:21:44 PM By: [name], [name], RN, CWS, [name], [name] Entered By: [name], [name], RN, CWS, Kim on 01/20/2020 13:15:32 [name] -------------------------------------------------------------------------------- Lower Extremity Assessment Details Patient Name: [name], [name]. Date of Service: 01/20/2020 12:45 PM Medical Record Number: [id number] Patient Account Number: [id number] Date of Birth/Sex: 1946-03-06 (74 y.o. F) Treating RN: Montey Hora Primary Care Jury Caserta: [name] Other Clinician: Referring Senta Kantor: [name] Treating Semya Klinke/Extender: Tito Dine in Treatment: 58 Edema Assessment Assessed: [Left: No] [Right: No] Edema: [Left: Ye] [Right: s] Calf Left: Right: Point of Measurement: 33 cm From Medial Instep 42.5 cm cm Ankle Left: Right: Point of Measurement: 10 cm From Medial Instep 24 cm cm Vascular Assessment Pulses: Dorsalis Pedis Palpable: [Left:Yes] Electronic Signature(s) Signed: 01/20/2020 4:39:55 PM By: Montey Hora Entered By: Montey Hora on 01/20/2020 12:58:31 Trolinger, [name] -------------------------------------------------------------------------------- Multi Wound Chart Details Patient Name: [name], [name]. Date of Service: 01/20/2020 12:45 PM Medical Record Number: [id number] Patient Account Number: [id number] Date of Birth/Sex: August 12, 1946 (74 y.o. F) Treating RN: Cornell Barman Primary Care Trevious Rampey: [name] Other Clinician: Referring Terriona Horlacher: [name] Treating Leeanna Slaby/Extender: Tito Dine in Treatment:  15 Vital Signs Height(in): 57 Pulse(bpm): 24 Weight(lbs): 224.7 Blood Pressure(mmHg): 147/60 Body Mass Index(BMI): 40 Temperature(F): 98.6 Respiratory Rate(breaths/min): 18 Photos: Wound Location: Left Lower Leg - Medial Left Lower Leg - Lateral Left Lower Leg - Lateral, Posterior Wounding Event: Gradually Appeared Gradually Appeared Gradually Appeared Primary Etiology: Lymphedema Venous Leg Ulcer Venous Leg Ulcer Comorbid History: Cataracts, Asthma, Sleep Apnea, Cataracts, Asthma, Sleep Apnea, Cataracts, Asthma, Sleep Apnea, Deep Vein Thrombosis, Deep Vein Thrombosis, Deep Vein Thrombosis, Hypertension, Peripheral Venous Hypertension, Peripheral Venous Hypertension, Peripheral Venous Disease, Osteoarthritis, Received Disease, Osteoarthritis, Received Disease, Osteoarthritis, Received Chemotherapy, Received  Radiation Chemotherapy, Received Radiation Chemotherapy, Received Radiation Date Acquired: 11/19/2018 01/19/2019 07/08/2019 Weeks of Treatment: 58 52 28 Wound Status: Open Open Open Measurements L x W x D (cm) 2.1x1.6x0.2 4.2x2x0.2 0.9x0.6x0.1 Area (cm) : 2.639 6.597 0.424 Volume (cm) : 0.528 1.319 0.042 % Reduction in Area: 69.10% -2898.60% 25.00% % Reduction in Volume: 38.20% -5895.50% 26.30% Classification: Full Thickness Without Exposed Full Thickness Without Exposed Full Thickness Without Exposed Support Structures Support Structures Support Structures Exudate Amount: Medium Medium Medium Exudate Type: Serosanguineous Serosanguineous Serosanguineous Exudate Color: red, brown red, brown red, brown Wound Margin: Flat and Intact Flat and Intact N/A Granulation Amount: Large (67-100%) Medium (34-66%) Medium (34-66%) Granulation Quality: Red Red Red Necrotic Amount: Small (1-33%) Medium (34-66%) Medium (34-66%) Exposed Structures: Fat Layer (Subcutaneous Tissue) Fat Layer (Subcutaneous Tissue) Fat Layer (Subcutaneous Tissue) Exposed: Yes Exposed: Yes Exposed: Yes Fascia:  No Fascia: No Fascia: No Tendon: No Tendon: No Tendon: No Muscle: No Muscle: No Muscle: No Joint: No Joint: No Joint: No Bone: No Bone: No Bone: No Epithelialization: None None None Treatment Notes Wound #5 (Left, Medial Lower Leg) Calaway, Mignon J. ([id number]) Notes s. cell, Drawtext, ABD, 4 layer Wound #6 (Left, Lateral Lower Leg) Notes s. cell, Drawtext, ABD, 4 layer Wound #9 (Left, Lateral, Posterior Lower Leg) Notes s. cell, Drawtext, ABD, 4 layer Electronic Signature(s) Signed: 01/20/2020 5:07:28 PM By: Linton Ham MD Entered By: Linton Ham on 01/20/2020 13:20:54 [name] ([id number]) -------------------------------------------------------------------------------- Multi-Disciplinary Care Plan Details Patient Name: KAYDANCE, GRABLE. Date of Service: 01/20/2020 12:45 PM Medical Record Number: [id number] Patient Account Number: [id number] Date of Birth/Sex: 1946/06/21 (74 y.o. F) Treating RN: Cornell Barman Primary Care Jeannene Tschetter: [name] Other Clinician: Referring Nicola Quesnell: [name] Treating Chanan Detwiler/Extender: Tito Dine in Treatment: 82 Active Inactive Medication Nursing Diagnoses: Knowledge deficit related to medication safety: actual or potential Goals: Patient/caregiver will demonstrate understanding of new oral/IV medications prescribed at the [facility] (topical prescriptions are covered under the skin breakdown problem) Date Initiated: 12/16/2019 Target Resolution Date: 01/13/2020 Goal Status: Active Interventions: Assess for medication contraindications each visit where new medications are prescribed Treatment Activities: New medication prescribed at [facility] : 12/16/2019 Notes: Soft Tissue Infection Nursing Diagnoses: Impaired tissue integrity Goals: Patient's soft tissue infection will resolve Date Initiated: 12/10/2018 Target Resolution Date: 01/09/2019 Goal Status: Active Interventions: Assess signs and  symptoms of infection every visit Notes: Venous Leg Ulcer Nursing Diagnoses: Actual venous Insuffiency (use after diagnosis is confirmed) Goals: Patient will maintain optimal edema control Date Initiated: 12/10/2018 Target Resolution Date: 01/09/2019 Goal Status: Active Interventions: Assess peripheral edema status every visit. Treatment Activities: Therapeutic compression applied : 12/10/2018 ADELAYDA, CRITZ ([id number]) Notes: Wound/Skin Impairment Nursing Diagnoses: Impaired tissue integrity Goals: Patient/caregiver will verbalize understanding of skin care regimen Date Initiated: 12/10/2018 Target Resolution Date: 01/09/2019 Goal Status: Active Interventions: Assess ulceration(s) every visit Treatment Activities: Topical wound management initiated : 12/10/2018 Notes: Electronic Signature(s) Signed: 01/22/2020 5:21:44 PM By: [name], [name], RN, CWS, [name], [name] Entered By: [name], [name], RN, CWS, Kim on 01/20/2020 13:12:41 Oceguera, [name] ([id number]) -------------------------------------------------------------------------------- Pain Assessment Details Patient Name: HETAL, ALTENHOFEN. Date of Service: 01/20/2020 12:45 PM Medical Record Number: [id number] Patient Account Number: [id number] Date of Birth/Sex: [birth year] (74 y.o. F) Treating RN: Montey Hora Primary Care Ludene Stokke: [name] Other Clinician: Referring Lexus Shampine: [name] Treating Leiana Rund/Extender: Tito Dine in Treatment: 25 Active Problems Location of Pain Severity and Description of Pain Patient Has Paino No Site Locations Pain Management and Medication Current  Pain Management: Electronic Signature(s) Signed: 01/20/2020 4:39:55 PM By: Montey Hora Entered By: Montey Hora on 01/20/2020 12:57:22 Piet, [name] -------------------------------------------------------------------------------- Patient/Caregiver Education Details Patient Name: BRI, BIANCHI. Date of  Service: 01/20/2020 12:45 PM Medical Record Number: [id number] Patient Account Number: [id number] Date of Birth/Gender: 1946/10/21 (74 y.o. F) Treating RN: Cornell Barman Primary Care Physician: [name] Other Clinician: Referring Physician: [name] Treating Physician/Extender: Tito Dine in Treatment: 74 Education Assessment Education Provided To: Patient Education Topics Provided Venous: Handouts: Controlling Swelling with Multilayered Compression Wraps Methods: Demonstration, Explain/Verbal Responses: State content correctly Electronic Signature(s) Signed: 01/22/2020 5:21:44 PM By: [name], [name], RN, CWS, [name], [name] Entered By: [name], [name], RN, CWS, Kim on 01/20/2020 13:14:48 [name] -------------------------------------------------------------------------------- Wound Assessment Details Patient Name: LAREN, LAWRIE. Date of Service: 01/20/2020 12:45 PM Medical Record Number: [id number] Patient Account Number: [id number] Date of Birth/Sex: August 09, 1946 (74 y.o. F) Treating RN: Montey Hora Primary Care Mairi Stagliano: [name] Other Clinician: Referring Heavin Sebree: [name] Treating Macklin Jacquin/Extender: Tito Dine in Treatment: 63 Wound Status Wound Number: 5 Primary Lymphedema Etiology: Wound Location: Left Lower Leg - Medial Wound Open Wounding Event: Gradually Appeared Status: Date Acquired: 11/19/2018 Comorbid Cataracts, Asthma, Sleep Apnea, Deep Vein Thrombosis, Weeks Of Treatment: 58 History: Hypertension, Peripheral Venous Disease, Osteoarthritis, Clustered Wound: No Received Chemotherapy, Received Radiation Photos Wound Measurements Length: (cm) 2.1 Width: (cm) 1.6 Depth: (cm) 0.2 Area: (cm) 2.639 Volume: (cm) 0.528 % Reduction in Area: 69.1% % Reduction in Volume: 38.2% Epithelialization: None Tunneling: No Undermining: No Wound Description Classification: Full Thickness Without Exposed  Support Struct Wound Margin: Flat and Intact Exudate Amount: Medium Exudate Type: Serosanguineous Exudate Color: red, brown ures Foul Odor After Cleansing: No Slough/Fibrino Yes Wound Bed Granulation Amount: Large (67-100%) Exposed Structure Granulation Quality: Red Fascia Exposed: No Necrotic Amount: Small (1-33%) Fat Layer (Subcutaneous Tissue) Exposed: Yes Necrotic Quality: Adherent Slough Tendon Exposed: No Muscle Exposed: No Joint Exposed: No Bone Exposed: No Treatment Notes Wound #5 (Left, Medial Lower Leg) Notes s. cell, Drawtext, ABD, 4 layer Electronic Signature(s) KATALYN, CONSIGLIO ([id number]) Signed: 01/20/2020 4:39:55 PM By: Montey Hora Entered By: Montey Hora on 01/20/2020 13:07:02 [name] -------------------------------------------------------------------------------- Wound Assessment Details Patient Name: [name], [name]. Date of Service: 01/20/2020 12:45 PM Medical Record Number: [id number] Patient Account Number: [id number] Date of Birth/Sex: [birth year] (74 y.o. F) Treating RN: Montey Hora Primary Care Herb Beltre: [name] Other Clinician: Referring Donald Jacque: [name] Treating Tinya Cadogan/Extender: Tito Dine in Treatment: 70 Wound Status Wound Number: 6 Primary Venous Leg Ulcer Etiology: Wound Location: Left Lower Leg - Lateral Wound Open Wounding Event: Gradually Appeared Status: Date Acquired: 01/19/2019 Comorbid Cataracts, Asthma, Sleep Apnea, Deep Vein Thrombosis, Weeks Of Treatment: 52 History: Hypertension, Peripheral Venous Disease, Osteoarthritis, Clustered Wound: No Received Chemotherapy, Received Radiation Photos Wound Measurements Length: (cm) 4.2 Width: (cm) 2 Depth: (cm) 0.2 Area: (cm) 6.597 Volume: (cm) 1.319 % Reduction in Area: -2898.6% % Reduction in Volume: -5895.5% Epithelialization: None Tunneling: No Undermining: No Wound Description Classification: Full Thickness  Without Exposed Support Struct Wound Margin: Flat and Intact Exudate Amount: Medium Exudate Type: Serosanguineous Exudate Color: red, brown ures Foul Odor After Cleansing: No Slough/Fibrino Yes Wound Bed Granulation Amount: Medium (34-66%) Exposed Structure Granulation Quality: Red Fascia Exposed: No Necrotic Amount: Medium (34-66%) Fat Layer (Subcutaneous Tissue) Exposed: Yes Necrotic Quality: Adherent Slough Tendon Exposed: No Muscle Exposed: No Joint Exposed: No Bone Exposed: No Treatment Notes Wound #6 (Left, Lateral Lower Leg)  Notes s. cell, Drawtext, ABD, 4 layer Electronic Signature(s) MERRYL, GOTH ([id number]) Signed: 01/20/2020 4:39:55 PM By: Montey Hora Entered By: Montey Hora on 01/20/2020 13:08:07 [name] ([id number]) -------------------------------------------------------------------------------- Wound Assessment Details Patient Name: [name], [name]. Date of Service: 01/20/2020 12:45 PM Medical Record Number: [id number] Patient Account Number: [id number] Date of Birth/Sex: 1946-09-02 (74 y.o. F) Treating RN: Montey Hora Primary Care Jaxen Samples: [name] Other Clinician: Referring Terron Merfeld: [name] Treating Crispin Vogel/Extender: Tito Dine in Treatment: 69 Wound Status Wound Number: 9 Primary Venous Leg Ulcer Etiology: Wound Location: Left Lower Leg - Lateral, Posterior Wound Open Wounding Event: Gradually Appeared Status: Date Acquired: 07/08/2019 Comorbid Cataracts, Asthma, Sleep Apnea, Deep Vein Thrombosis, Weeks Of Treatment: 28 History: Hypertension, Peripheral Venous Disease, Osteoarthritis, Clustered Wound: No Received Chemotherapy, Received Radiation Photos Wound Measurements Length: (cm) 0.9 Width: (cm) 0.6 Depth: (cm) 0.1 Area: (cm) 0.424 Volume: (cm) 0.042 % Reduction in Area: 25% % Reduction in Volume: 26.3% Epithelialization: None Tunneling: No Undermining: No Wound  Description Classification: Full Thickness Without Exposed Support Struct Exudate Amount: Medium Exudate Type: Serosanguineous Exudate Color: red, brown ures Slough/Fibrino Yes Wound Bed Granulation Amount: Medium (34-66%) Exposed Structure Granulation Quality: Red Fascia Exposed: No Necrotic Amount: Medium (34-66%) Fat Layer (Subcutaneous Tissue) Exposed: Yes Necrotic Quality: Adherent Slough Tendon Exposed: No Muscle Exposed: No Joint Exposed: No Bone Exposed: No Treatment Notes Wound #9 (Left, Lateral, Posterior Lower Leg) Notes s. cell, Drawtext, ABD, 4 layer Electronic Signature(s) Signed: 01/20/2020 4:39:55 PM By: Lambert Mody ([id number]) Entered By: Montey Hora on 01/20/2020 13:08:33 Primeau, [name] ([id number]) -------------------------------------------------------------------------------- Vitals Details Patient Name: [name], [name]. Date of Service: 01/20/2020 12:45 PM Medical Record Number: [id number] Patient Account Number: [id number] Date of Birth/Sex: [birth year] (74 y.o. F) Treating RN: Cornell Barman Primary Care Lovelee Forner: [name] Other Clinician: Referring Kevion Fatheree: [name] Treating Gerhart Ruggieri/Extender: Tito Dine in Treatment: 58 Vital Signs Time Taken: 12:50 Temperature (F): 98.6 Height (in): 63 Pulse (bpm): 77 Weight (lbs): 224.7 Respiratory Rate (breaths/min): 18 Body Mass Index (BMI): 39.8 Blood Pressure (mmHg): 147/60 Reference Range: 80 - 120 mg / dl Electronic Signature(s) Signed: 01/20/2020 4:00:59 PM By: Lorine Bears RCP, RRT, CHT Entered By: Lorine Bears on 01/20/2020 12:52:26

## 2020-01-22 NOTE — Progress Notes (Signed)
[name], [name] Visit Report for 01/22/2020 Arrival Information Details Patient Name: [name], [name]. Date of Service: 01/22/2020 1:45 PM Medical Record Number: [id number] Patient Account Number: [id number] Date of Birth/Sex: Jun 25, 1946 (74 y.o. F) Treating RN: Cornell Barman Primary Care Melanie Pellot: [name] Other Clinician: Referring Kamarie Palma: [name] Treating Aasiya Creasey/Extender: Melburn Hake, HOYT Weeks in Treatment: 63 Visit Information History Since Last Visit Has Dressing in Place as Prescribed: Yes Patient Arrived: Ambulatory Has Compression in Place as Prescribed: Yes Arrival Time: 14:40 Pain Present Now: No Accompanied By: self Transfer Assistance: None Patient Identification Verified: Yes Secondary Verification Process Completed: Yes Patient Requires Transmission-Based No Precautions: Patient Has Alerts: Yes Patient Alerts: Patient on Blood Thinner aspirin 81 Electronic Signature(s) Signed: 01/22/2020 5:21:44 PM By: [name], [name], RN, CWS, [name], [name] Entered By: [name], [name], RN, CWS, Kim on 01/22/2020 14:40:24 [name] -------------------------------------------------------------------------------- Compression Therapy Details Patient Name: [name], [name]. Date of Service: 01/22/2020 1:45 PM Medical Record Number: [id number] Patient Account Number: [id number] Date of Birth/Sex: 02/25/1946 (74 y.o. F) Treating RN: Cornell Barman Primary Care Harbert Fitterer: [name] Other Clinician: Referring Kindred Reidinger: [name] Treating Velma Hanna/Extender: Melburn Hake, HOYT Weeks in Treatment: 58 Compression Therapy Performed for Wound Assessment: Wound #5 Left,Medial Lower Leg Performed By: Clinician Cornell Barman, RN Compression Type: Four Layer Pre Treatment ABI: 1 Electronic Signature(s) Signed: 01/22/2020 5:21:44 PM By: [name], [name], RN, CWS, [name], [name] Entered By: [name], [name], RN, CWS, Kim on 01/22/2020 14:42:02 [name]  ([id number]) -------------------------------------------------------------------------------- Compression Therapy Details Patient Name: [name], [name]. Date of Service: 01/22/2020 1:45 PM Medical Record Number: [id number] Patient Account Number: [id number] Date of Birth/Sex: 04/15/1946 (74 y.o. F) Treating RN: Cornell Barman Primary Care Navya Timmons: [name] Other Clinician: Referring Kshawn Canal: [name] Treating Deloss Amico/Extender: Melburn Hake, HOYT Weeks in Treatment: 58 Compression Therapy Performed for Wound Assessment: Wound #6 Left,Lateral Lower Leg Performed By: Clinician Cornell Barman, RN Compression Type: Four Layer Pre Treatment ABI: 1 Electronic Signature(s) Signed: 01/22/2020 5:21:44 PM By: [name], [name], RN, CWS, [name], [name] Entered By: [name], [name], RN, CWS, Kim on 01/22/2020 14:42:03 [name] -------------------------------------------------------------------------------- Compression Therapy Details Patient Name: [name], [name]. Date of Service: 01/22/2020 1:45 PM Medical Record Number: [id number] Patient Account Number: [id number] Date of Birth/Sex: Mar 24, 1946 (74 y.o. F) Treating RN: Cornell Barman Primary Care Samuel Rittenhouse: [name] Other Clinician: Referring Mekayla Soman: [name] Treating Andreia Gandolfi/Extender: Melburn Hake, HOYT Weeks in Treatment: 58 Compression Therapy Performed for Wound Assessment: Wound #9 Left,Lateral,Posterior Lower Leg Performed By: Clinician Cornell Barman, RN Compression Type: Four Layer Pre Treatment ABI: 1 Electronic Signature(s) Signed: 01/22/2020 5:21:44 PM By: [name], [name], RN, CWS, [name], [name] Entered By: [name], [name], RN, CWS, Kim on 01/22/2020 14:42:03 [name] -------------------------------------------------------------------------------- Encounter Discharge Information Details Patient Name: [name], [name]. Date of Service: 01/22/2020 1:45 PM Medical Record Number: [id number] Patient Account  Number: [id number] Date of Birth/Sex: 1946-10-02 (74 y.o. F) Treating RN: Cornell Barman Primary Care Jasreet Dickie: [name] Other Clinician: Referring Truitt Cruey: [name] Treating Armend Hochstatter/Extender: Melburn Hake, HOYT Weeks in Treatment: 65 Encounter Discharge Information Items Discharge Condition: Stable Ambulatory Status: Ambulatory Discharge Destination: Home Transportation: Private Auto Accompanied By: self Schedule Follow-up Appointment: Yes Clinical Summary of Care: Electronic Signature(s) Signed: 01/22/2020 5:21:44 PM By: [name], [name], RN, CWS, [name], [name] Entered By: [name], [name], RN, CWS, Kim on 01/22/2020 14:43:07 [name] -------------------------------------------------------------------------------- Wound Assessment Details Patient Name: [name], [name]. Date of Service: 01/22/2020 1:45 PM Medical  Record Number: [id number] Patient Account Number: [id number] Date of Birth/Sex: [birth year] (74 y.o. F) Treating RN: Cornell Barman Primary Care Nello Corro: [name] Other Clinician: Referring Oreoluwa Gilmer: [name] Treating Keirston Saephanh/Extender: Melburn Hake, HOYT Weeks in Treatment: 73 Wound Status Wound Number: 5 Primary Etiology: Lymphedema Wound Location: Left, Medial Lower Leg Wound Status: Open Wounding Event: Gradually Appeared Date Acquired: 11/19/2018 Weeks Of Treatment: 58 Clustered Wound: No Wound Measurements Length: (cm) 2.1 Width: (cm) 1.6 Depth: (cm) 0.2 Area: (cm) 2.639 Volume: (cm) 0.528 % Reduction in Area: 69.1% % Reduction in Volume: 38.2% Wound Description Classification: Full Thickness Without Exposed Support Structure s Treatment Notes Wound #5 (Left, Medial Lower Leg) Notes s. cell, ABD, 4 layer Electronic Signature(s) Signed: 01/22/2020 5:21:44 PM By: [name], [name], RN, CWS, [name], [name] Entered By: [name], [name], RN, CWS, Kim on 01/22/2020 14:41:34 Freyre, [name]  ([id number]) -------------------------------------------------------------------------------- Wound Assessment Details Patient Name: [name], [name]. Date of Service: 01/22/2020 1:45 PM Medical Record Number: [id number] Patient Account Number: [id number] Date of Birth/Sex: 09/12/1946 (74 y.o. F) Treating RN: Cornell Barman Primary Care Terrel Nesheiwat: [name] Other Clinician: Referring Aubre Quincy: [name] Treating Yanel Dombrosky/Extender: Melburn Hake, HOYT Weeks in Treatment: 39 Wound Status Wound Number: 6 Primary Etiology: Venous Leg Ulcer Wound Location: Left, Lateral Lower Leg Wound Status: Open Wounding Event: Gradually Appeared Date Acquired: 01/19/2019 Weeks Of Treatment: 52 Clustered Wound: No Wound Measurements Length: (cm) 4.2 Width: (cm) 2 Depth: (cm) 0.2 Area: (cm) 6.597 Volume: (cm) 1.319 % Reduction in Area: -2898.6% % Reduction in Volume: -5895.5% Wound Description Classification: Full Thickness Without Exposed Support Structure s Treatment Notes Wound #6 (Left, Lateral Lower Leg) Notes s. cell, ABD, 4 layer Electronic Signature(s) Signed: 01/22/2020 5:21:44 PM By: [name], [name], RN, CWS, [name], [name] Entered By: [name], [name], RN, CWS, Kim on 01/22/2020 14:41:35 Maj, [name] ([id number]) -------------------------------------------------------------------------------- Wound Assessment Details Patient Name: [name], [name]. Date of Service: 01/22/2020 1:45 PM Medical Record Number: [id number] Patient Account Number: [id number] Date of Birth/Sex: 1946/08/16 (75 y.o. F) Treating RN: Cornell Barman Primary Care Alveda Vanhorne: [name] Other Clinician: Referring Anthony Tamburo: [name] Treating Corrine Tillis/Extender: Melburn Hake, HOYT Weeks in Treatment: 25 Wound Status Wound Number: 9 Primary Etiology: Venous Leg Ulcer Wound Location: Left, Lateral, Posterior Lower Leg Wound Status: Open Wounding Event: Gradually Appeared Date Acquired: 07/08/2019 Weeks Of  Treatment: 28 Clustered Wound: No Wound Measurements Length: (cm) 0.9 Width: (cm) 0.6 Depth: (cm) 0.1 Area: (cm) 0.424 Volume: (cm) 0.042 % Reduction in Area: 25% % Reduction in Volume: 26.3% Wound Description Classification: Full Thickness Without Exposed Support Structure s Treatment Notes Wound #9 (Left, Lateral, Posterior Lower Leg) Notes s. cell, ABD, 4 layer Electronic Signature(s) Signed: 01/22/2020 5:21:44 PM By: [name], [name], RN, CWS, [name], [name] Entered By: [name], [name], RN, CWS, Kim on 01/22/2020 14:41:35

## 2020-01-22 NOTE — Progress Notes (Signed)
MALA, BONKOSKI ([id number]) Visit Report for 01/20/2020 HPI Details Patient Name: [name], [name]. Date of Service: 01/20/2020 12:45 PM Medical Record Number: [id number] Patient Account Number: [id number] Date of Birth/Sex: 1946/06/20 (74 y.o. F) Treating RN: Cornell Barman Primary Care Provider: [name] Other Clinician: Referring Provider: [name] Treating Provider/Extender: Tito Dine in Treatment: 31 History of Present Illness HPI Description: Pleasant 74 year old with history of chronic venous insufficiency. No diabetes or peripheral vascular disease. Left ABI 1.29. Questionable history of left lower extremity DVT. She developed a recurrent ulceration on her left lateral calf in December 2015, which she attributes to poor diet and subsequent lower extremity edema. She underwent endovenous laser ablation of her left greater saphenous vein in 2010. She underwent laser ablation of accessory branch of left GSV in April 2016 by Dr. Kellie Simmering at [facility]. She was previously wearing Unna boots, which she tolerated well. Tolerating 2 layer compression and cadexomer iodine. She returns to clinic for follow-up and is without new complaints. She denies any significant pain at this time. She reports persistent pain with pressure. No claudication or ischemic rest pain. No fever or chills. No drainage. READMISSION 11/13/16; this is a 74 year old woman who is not a diabetic. She is here for a review of a painful area on her left medial lower extremity. I note that she was seen here previously last year for wound I believe to be in the same area. At that time she had undergone previously a left greater saphenous vein ablation by Dr. Kellie Simmering and she had a ablation of the anterior accessory branch of the left greater saphenous vein in March 2016. Seeing that the wound actually closed over. In reviewing the history with her today the ulcer in this area has been recurrent. She describes  a biopsy of this area in 2009 that only showed stasis physiology. 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 Do not see that she has ever had arterial studies in the left leg. She tells me she has a follow-up with Dr. Kellie Simmering in roughly 10 days In any case she developed the reopening of this area roughly a month ago. On the background of this she describes rapidly increasing edema which has responded to Lasix 40 mg and metolazone 2.5 mg as well as the patient's lymph massage. She has been told she has both venous insufficiency and lymphedema but she cannot tolerate compression stockings 11/28/16; the patient saw Dr. Kellie Simmering recently. Per the patient he did arterial Dopplers in the office that did not show evidence of arterial insufficiency, per the patient he stated "treat this like an ordinary venous ulcer". She also saw her dermatologist Dr. Ronnald Ramp who felt that this was more of a vascular ulcer. In general things are improving although she arrives today with increasing bilateral lower extremity edema with weeping a deeper fluid through the wound on the left medial leg compatible with some degree of lymphedema 12/04/16; the patient's wound is fully epithelialized but I don't think fully healed. We will do another week of depression with Promogran and TCA however I suspect we'll be able to discharge her next week. This is a very unusual-looking wound which was initially a figure-of-eight type wound lying on its side surrounded by petechial like hemorrhage. She  has had venous ablation on this side. She apparently does not have an arterial issue per Dr. Kellie Simmering. She saw her dermatologist thought it was "vascular". Patient is definitely going to need ongoing compression and I talked about this  with her today she will go to elastic therapy after she leaves here next week 12/11/16; the patient's wound is not completely closed today. She has surrounding scar tissue and in further discussion with the patient it would appear that she had ulcers in this area in 2009 for a prolonged period of time ultimately requiring a punch biopsy of this area that only showed venous insufficiency. I did not previously pickup on this part of the history from the patient. 12/18/16; the patient's wound is completely epithelialized. There is no open area here. She has significant bilateral venous insufficiency with secondary lymphedema to a mild-to-moderate degree she does not have compression stockings.. She did not say anything to me when I was in the room, she told our intake nurse that she was still having pain in this area. This isn't unusual recurrent small open area. She is going to go to elastic therapy to obtain compression stockings. 12/25/16; the patient's wound is fully epithelialized. There is no open area here. The patient describes some continued episodic discomfort in this area medial left calf. However everything looks fine and healed here. She is been to elastic therapy and caught herself 15-20 mmHg stockings, they apparently were having trouble getting 20-30 mm stockings in her size 01/22/17; this is a patient we discharged from the clinic a month ago. She has a recurrent open wound on her medial left calf. She had 15 mm support stockings. I told her I thought she needed 20-30 mm compression stockings. She tells me that she has been ill with hospitalization secondary to asthma and is been found to have severe hypokalemia likely secondary to a combination of Lasix and metolazone. This morning she noted blistering and leaking fluid on the posterior part of her left leg. She called our intake nurse urgently and we was saw her this afternoon. She has not had any real discomfort here. I don't know that  she's been wearing any stockings on this leg for at least 2-3 days. ABIs in this clinic were 1.21 on the right and 1.3 on the left. She is previously seen vascular surgery who does not think that there is a peripheral arterial issue. 01/30/17; Patient arrives with no open wound on the left leg. She has been to elastic therapy and obtained 20-57mmhg below knee stockings and she has one on the right leg today. READMISSION 02/19/18; this Synnott is a now 74 year old patient we've had in this clinic perhaps 3 times before. I had last looked at her from January 07 December 2016 with an area on the medial left leg. We discharged her on 12/25/16 however she had to be readmitted on 01/22/17 with a recurrence. I have in my notes that we discharged her on 20-30 mm stockings although she tells me she was only wearing support hose because she cannot get stockings on KESHUNA, HIGH. ([id number]) predominantly related to her cervical spine surgery/issues. She has had previous ablations done by vein and vascular in [place] including a great saphenous vein ablation on the left with an anterior accessory branch ablation I think both of these were in 2016. On one of the previous visit she had a biopsy noted 2009 that was negative. She is not felt to have an arterial issue. She is not  a diabetic. She does have a history of obstructive sleep apnea hypertension asthma as well as chronic venous insufficiency and lymphedema. On this occasion she noted 2 dry scaly patch on her left leg. She tried to put lotion on this it didn't really help. There were 2 open areas.the patient has been seeing her primary physician from 02/05/18 through 02/14/18. She had Unna boots applied. The superior wound now on the lateral left leg has closed but she's had one wound that remains open on the lateral left leg. This is not the same spot as we dealt with in 2018. ABIs in this clinic were 1.3 bilaterally 02/26/18; patient has a small wound on the  left lateral calf. Dimensions are down. She has chronic venous insufficiency and lymphedema. 03/05/18; small open area on the left lateral calf. Dimensions are down. Tightly adherent necrotic debris over the surface of the wound which was difficult to remove. Also the dressing [over collagen] stuck to the wound surface. This was removed with some difficulty as well. Change the primary dressing to Hydrofera Blue ready 03/12/18; small open area on the left lateral calf. Comes in with tightly adherent surface eschar as well as some adherent Hydrofera Blue. 03/19/18; open area on the left lateral calf. Again adherent surface eschar as well as some adherent Hydrofera Blue nonviable subcutaneous tissue. She complained of pain all week even with the reduction from 4-3 layer compression I put on last week. Also she had an increase in her ankle and calf measurements probably related to the same thing. 03/26/18; open area on the left lateral calf. A very small open area remains here. We used silver alginate starting last week as the Hydrofera Blue seem to stick to the wound bed. In using 4-layer compression 04/02/18; the open area in the left lateral calf at some adherent slough which I removed there is no open area here. We are able to transition her into her own compression stocking. Truthfully I think this is probably his support hose. However this does not maintain skin integrity will be limited. She cannot put over the toe compression stockings on because of neck problems hand problems etc. She is allergic to the lining layer of juxta lites. We might be forced to use extremitease stocking should this fail READMIT 11/24/2018 Patient is now a 74 year old woman who is not a diabetic. She has been in this clinic on at least 3 previous occasions largely with recurrent wounds on her left leg secondary to chronic venous insufficiency with secondary lymphedema. Her situation is complicated by inability to get stockings  on and an allergy to neoprene which is apparently a component and at least juxta lites and other stockings. As a result she really has not been wearing any stockings on her legs. She tells [place] that roughly 2 or 3 weeks ago she started noticing a stinging sensation just above her ankle on the left medial aspect. She has been diagnosed with pseudogout and she wondered whether this was what she was experiencing. She tried to dress this with something she bought at the store however subsequently it pulled skin off and now she has an open wound that is not improving. She has been using Vaseline gauze with a cover bandage. She saw her primary doctor last week who put an Haematologist on her. ABIs in this clinic was 1.03 on the left 2/12; the area is on the left medial ankle. Odd-looking wound with what looks to be surface epithelialization but a multitude of  small petechial openings. This clearly not closed yet. We have been using silver alginate under 3 layer compression with TCA 2/19; the wound area did not look quite as good this week. Necrotic debris over the majority of the wound surface which required debridement. She continues to have a multitude of what looked to be small petechial openings. She reminds [place] that she had a biopsy on this initially during her first outbreak in 2015 in [place] dermatology. She expresses concern about this being a possible melanoma. 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 I assured her that this does not look anything like melanoma. Except for the petechial reaction it does look like a venous insufficiency area and she certainly has evidence of this on both sides 2/26; a difficult area on the left medial ankle. The patient clearly has chronic venous hypertension with some degree of lymphedema. The odd thing about the area is the small petechial hemorrhages. I am not really sure how  to explain this. This was present last time and this is not a compression injury. We have been using Hydrofera Blue which I changed to last week 3/4; still using Hydrofera Blue. Aggressive debridement today. She does not have known arterial issues. She has seen Dr. Kellie Simmering at [facility] vein and vascular and and has an ablation on the left. [Anterior accessory branch of the greater saphenous]. From what I remember they did not feel she had an arterial issue. The patient has had this area biopsied in 2009 at [facility] dermatology and by her recollection they said this was "stasis". She is also follow-up with dermatology locally who thought that this was more of a vascular issue 3/11; using Hydrofera Blue. Aggressive debridement today. She does not have an arterial issue. We are using 3 layer compression although we may need to go to 4. The patient has been in for multiple changes to her wrap since I last saw her a week ago. She says that the area was leaking. I do not have too much more information on what was found 01/19/19 on evaluation today patient was actually being seen for a nurse visit when unfortunately she had the area on her left lateral lower extremity as well as weeping from the right lower extremity that became apparent. Therefore we did end up actually seeing her for a full visit with myself. She is having some pain at this site as well but fortunately nothing too significant at this point. No fevers, chills, nausea, or vomiting noted at this time. 3/18-Patient is back to the clinic with the left leg venous leg ulcer, the ulcer is larger in size, has a surface that is densely adherent with fibrinous tissue, the Hydrofera Blue was used but is densely adherent and there was difficulty in removing it. The right lower extremity was also wrapped for weeping edema. Patient has a new area over the left lateral foot above the malleolus that is small and appears to have no debris with intact  surrounding skin. Patient is on increased dose of Lasix also as a means to edema management 3/25; the patient has a nonhealing venous ulcer on the medial left leg and last week developed a smaller area on the lateral left calf. We have been using Hydrofera Blue with a contact layer. 4/1; no major change in these wounds areas. Left medial and more recently left lateral calf. I tried Iodoflex last week to aid in debridement  she did not tolerate this. She stated her pain was terrible all week. She took the top layer of the 4 layer compression off. 4/8; the patient actually looks somewhat better in terms of her more prominent left lateral calf wound. There is some healthy looking tissue here. She is still complaining of a lot of discomfort. 4/15; patient in a lot of pain secondary to sciatica. She is on a prednisone taper prescribed by her primary physician. She has the 2 areas one on the [place], JACQUITA GIANNINI. ([id number]) left medial and more recently a smaller area on the left lateral calf. Both of these just above the malleoli 4/22; her back pain is better but she still states she is very uncomfortable and now feels she is intolerant to the [employer]. No real change in the wounds we have been using Sorbact. She has been previously intolerant to Iodoflex. There is not a lot of option about what we can use to debride this wound under compression that she no doubt needs. sHe states Ultram no longer works for her pain 4/29; no major change in the wounds slightly increased depth. Surface on the original medial wound perhaps somewhat improved however the more recent area on the lateral left ankle is 100% covered in very adherent debris we have been using Sorbact. She tolerates 4 layer compression well and her edema control is a lot better. She has not had to come in for a nurse check 5/6; no major change in the condition of the wounds. She did consent to debridement today which was done with some difficulty.  Continuing Sorbact. She did not tolerate Iodoflex. She was in for a check of her compression the day after we wrapped her last week this was adjusted but nothing much was found 5/13; no major change in the condition or area of the wounds. I was able to get a fairly aggressive debridement done on the lateral left leg wound. Even using Sorbact under compression. She came back in on Friday to have the wrap changed. She says she felt uncomfortable on the lateral aspect of her ankle. She has a long history of chronic venous insufficiency including previous ablation surgery on this side. 5/20-Patient returns for wounds on left leg with both wounds covered in slough, with the lateral leg wound larger in size, she has been in 3 layer compression and felt more comfortable, she describes pain in ankle, in leg and pins and needles in foot, and is about to try Pamelor for this 6/3; wounds on the left lateral and left medial leg. The area medially which is the most recent of the 2 seems to have had the largest increase in dimensions. We have been using Sorbac to try and debride the surface. She has been to see orthopedics they apparently did a plain x-ray that was indeterminant. Diagnosed her with neuropathy and they have ordered an MRI to determine if there is underlying osteomyelitis. This was not high on my thought list but I suppose it is prudent. We have advised her to make an appointment with vein and vascular in [place]. She has a history of a left greater saphenous and accessory vein ablations I wonder if there is anything else that can be done from a surgical point of view to help in these difficult refractory wounds. We have previously healed this wound on one occasion but it keeps on reopening [medial side] 6/10; deep tissue culture I did last week I think on the left medial wound showed  both moderate E. coli and moderate staph aureus [MSSA]. She is going to require antibiotics and I have chosen  Augmentin. We have been using Sorbact and we have made better looking wound surface on both sides but certainly no improvement in wound area. She was back in last Friday apparently for a dressing changes the wrap was hurting her outer left ankle. She has not managed to get a hold of vein and vascular in [place]. We are going to have to make her that appointment 6/17; patient is tolerating the Augmentin. She had an MRI that I think was ordered by orthopedic surgeon this did not show osteomyelitis or an abscess did suggest cellulitis. We have been using Sorbact to the lateral and medial ankles. We have been trying to arrange a follow-up appointment with vein and vascular in [place] or did her original ablations. We apparently an area sent the request to vein and vascular in [facility] 6/24; patient has completed the Augmentin. We do not yet have a vein and vascular appointment in [place]. I am not sure what the issue is here we have asked her to call tomorrow. We are using Sorbact. Making some improvements and especially the medial wound. Both surfaces however look better medial and lateral. 7/1; the patient has been in contact with vein and vascular in [place] but has not yet received an appointment. Using Sorbact we have gradually improve the wound surface with no improvement in surface area. She is approved for Apligraf but the wound surface still is not completely viable. She has not had to come in for a dressing change 7/8; the patient has an appointment with vein and vascular on 7/31 which is a Friday afternoon. She is concerned about getting back here for [place] to dress her wounds. I think it is important to have them goal for her venous reflux/history of ablations etc. to see if anything else can be done. She apparently tested positive for 1 of the blood tests with regards to lupus and saw a rheumatologist. He has raised the issue of vasculitis again. I have had this thought in the  past however the evidence seems overwhelming that this is a venous reflux etiology. If the rheumatologist tells me there is clinical and laboratory investigation is positive for lupus I will rethink this. 7/15; the patient's wound surfaces are quite a bit better. The medial area which was her original wound now has no depth although the lateral wound which was the more recent area actually appears larger. Both with viable surfaces which is indeed better. Using Sorbact. I wanted to use Apligraf on her however there is the issue of the vein and vascular appointment on 7/31 at 2:00 in the afternoon which would not allow her to get back to be rewrapped and they would no doubt remove the graft 7/22; the patient's wound surfaces have moderate amount of debris although generally look better. The lateral one is larger with 2 small satellite areas superiorly. We are waiting for her vein and vascular appointment on 7/31. She has been approved for Apligraf which I would like to use after th 7/29; wound surfaces have improved no debridement is required we have been using Sorbact. She sees vein and vascular on Friday with this so question of whether anything can be done to lessen the likelihood of recurrence and/or speed the healing of these areas. She is already had previous ablations. She no doubt has severe venous hypertension 8/5-Patient returns at 1 week, she was in [employer]  for 3 days by her podiatrist, we have been using so backed to the wound, she has increased pain in both the wounds on the left lower leg especially the more distal one on the lateral aspect 8/12-Patient returns at 1 week and she is agreeable to having debridement in both wounds on her left leg today. We have been using Sorbact, and vascular studies were reviewed at last visit 8/19; the patient arrives with her wounds fairly clean and no debridement is required. We have used Sorbact which is really done a nice job in cleaning up these  very difficult wound surfaces. The patient saw Dr. Donzetta Matters of vascular surgery on 7/31. He did not feel that there was an arterial component. He felt that her treated greater saphenous vein is adequately addressed and that the small saphenous vein did not appear to be involved significantly. She was also noted to have deep venous reflux which is not treatable. Dr. Donzetta Matters mentioned the possibility of a central obstructive component leading to reflux and he offered her central venography. She wanted to discuss this or think about it. I have urged her to go ahead with this. She has had recurrent difficult wounds in these areas which do heal but after months in the clinic. If there is anything that can be done to reduce the likelihood of this I think it is worth it. 9/2 she is still working towards getting follow-up with Dr. Donzetta Matters to schedule her CT. Things are quite a bit worse venography. I put Apligraf on 2 weeks ago on both wounds on the medial and lateral part of her left lower leg. She arrives in clinic today with 3 superficial additional wounds above the area laterally and one below the wound medially. She describes a lot of discomfort. I think these are probably wrapped injuries. Does not look like she has cellulitis. 07/20/2019 on evaluation today patient appears to be doing somewhat poorly in regard to her lower extremity ulcers. She in fact showed signs of erythema in fact we may even be dealing with an infection at this time. Unfortunately I am unsure if this is just infection or if indeed there may be some allergic reaction that occurred as a result of the Apligraf application. With that being said that would be unusual but nonetheless not impossible in this patient is one who is unfortunately allergic to quite a bit. Currently we have been using the Sorbact which seems to do as well as anything for her. I do think we may want to obtain a culture today to see if there is anything showing up there  that may need to be addressed. HARLEYQUINN, ECKELKAMP ([id number]) 9/16; noted that last week the wounds look worse in 1 week follow-up of the Apligraf. Using Sorbact as of 2 days ago. She arrives with copious amounts of drainage and new skin breakdown on the back of the left calf. The wounds arm more substantial bilaterally. There is a fair amount of swelling in the left calf no overt DVT there is edema present I think in the left greater than right thigh. She is supposed to go on 9/28 for CT venography. The wounds on the medial and lateral calf are worse and she has new skin breakdown posteriorly at least new for me. This is almost developing into a circumferential wound area The Apligraf was taken off last week which I agree with things are not going in the right direction a culture was done we do not have that  back yet. She is on Augmentin that she started 2 days ago 9/23; dressing was changed by her nurses on Monday. In general there is no improvement in the wound areas although the area looks less angry than last week. She did get Augmentin for MSSA cultured on the 14th. She still appears to have too much swelling in the left leg even with 3 layer compression 9/30; the patient underwent her procedure on 9/28 by Dr. Donzetta Matters at [facility]. She was discovered to have the common iliac vein measuring 12.2 mm but at the level of L4-L5 measured 3 mm. After stenting it measured 10 mm. 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 Rouleaux flow in the common femoral and femoral vein was observed much improved after stenting. We are using silver alginate to the wounds on the medial and lateral ankle on the left. 4 layer compression 10/7; the patient had fluid swelling around her knee and 4 layer compression. At the advice of vein and vascular this was reduced to 3 layer which she is tolerating better. We have been using silver alginate under 3 layer compression since last  Friday 10/14; arrives with the areas on the left ankle looking a lot better. Inflammation in the area also a lot better. She came in for a nurse check on 10/9 10/21; continued nice improvement. Slight improvements in surface area of both the medial and lateral wounds on the left. A lot of the satellite lesions in the weeping erythema around these from stasis dermatitis is resolved. We have been using silver alginate 10/28; general improvement in the entire wound areas although not a lot of change in dimensions the wound certainly looks better. There is a lot less in terms of venous inflammation. Continue silver alginate this week however look towards Hydrofera Blue next week 11/4; very adherent debris on the medial wound left wound is not as bad. We have been using silver alginate. Change to [facility] today 11/11; very adherent debris on both wound areas. She went to vein and vascular last week and follow-up they put in [place] boot on this today. He says the [facility] was adherent. Wound is definitely not as good as last week. Especially on the left there the satellite lesions look more prominent 11/18; absolutely no better. erythema on lateral aspect with tenderness. 09/30/2019 on evaluation today patient appears to actually be doing better. Dr. Dellia Nims did put her on doxycycline last week which I do believe has helped her at this point. Fortunately there is no signs of active infection at this time. No fevers, chills, nausea, vomiting, or diarrhea. I do believe he may want extend the doxycycline for 7 additional days just to ensure everything does completely cleared up the patient is in agreement with that plan. Otherwise she is going require some sharp debridement today 12/2; patient is completing a 2-week course of doxycycline. I gave her this empirically for inflammation as well as infection when I last saw her 2 weeks ago. All of this seems to be better. She is using silver alginate she  has the area on the medial aspect of the larger area laterally and the 2 small satellite regions laterally above the major wound. 12/9; the patient's wound on the left medial and left lateral calf look really quite good. We have been using silver alginate. She saw vein and vascular in follow-up on 10/09/2019. She has had a previous left greater saphenous vein ablation by Dr. Oscar La in 2016.  More recently she underwent a left common iliac vein stent by Dr. 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 The swelling is improved and certainly the wounds have improved. The patient shows [place] today area on the right medial calf there is almost no wound but leaking lymphedema. She says she start this started 3 or 4 days ago. She did not traumatize it. It is not painful. She does not wear compression on that side 12/16; the patient continues to do well laterally. Medially still requiring debridement. The area on the right calf did not materialize to anything and is not currently open. We wrapped this last time. She has support stockings for that leg although I am not sure they are going to provide adequate compression 12/23; the lateral wound looks stable. Medially still requiring debridement for tightly adherent fibrinous debris. We've been using silver alginate. Surface area not any different 12/30; neither wound is any better with regards to surface and the area on the left lateral is larger. I been using silver alginate to the left lateral which look quite good last week and Sorbact to the left medial 11/11/2019. Lateral wound area actually looks better and somewhat smaller. Medial still requires a very aggressive debridement today. We have been using Sorbact on both wound areas 1/13; not much better still adherent debris bilaterally. I been using Sorbact. She has severe venous hypertension. Probably some degree of dermal fibrosis distally. I wonder whether tighter compression might help and I am going  to try that today. We also need to work on the bioburden 1/20; using Sorbact. She has severe venous hypertension status post stent placement for pelvic vein compression. We applied gentamicin last time to see if we could reduce bioburden I had some discussion with her today about the use of pentoxifylline. This is occasionally used in this setting for wounds with refractory venous insufficiency. However this interacts with Plavix. She tells me that she was put on this after stent placement for 3 months. She will call Dr. Claretha Cooper office to discuss 1/27; we are using gentamicin under Sorbact. 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 She has a stent. Wound medially is measuring smaller this week. Laterally measuring slightly larger although she has some satellite lesions superiorly 2/3; gentamicin under Sorbact under 4-layer compression. 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 She has a stent on Plavix. Her wounds are measuring smaller this week. More substantially laterally where there is a satellite lesion superiorly. 2/10; gentamicin under Sorbac. 4-layer compression. Patient communicated with Dr. Donzetta Matters at vein and vascular in [place]. He is okay with the patient coming off Plavix I will therefore start her on pentoxifylline for a 1 month trial. In general her wounds look better today. I had some concerns about swelling in the left thigh however she measures 61.5 on the right and 63 on the mid thigh which does not suggest there is any difficulty. The patient is not describing any pain. 2/17; gentamicin under Sorbac 4-layer compression. She has been on pentoxifylline for 1 week and complains of loose stool. No nausea she is eating and drinking well 2/24; the patient apparently came in 2 days ago for a nurse visit when her wrap fell down. Both areas look a little worse this week macerated medially and satellite lesions laterally. Change to  silver alginate today CARALEE, GHATTAS ([id number]) 3/3; wounds are larger today especially medially. She also has more swelling in her foot lower leg and I even  noted some swelling in her posterior thigh which is tender. I wonder about the patency of her stent. Fortuitously she sees Dr. Claretha Cooper group on Friday 3/10; Mrs. Battle was seen by vein and vascular on 3/5. The patient underwent ultrasound. There was no evidence of thrombosis involving the IVC no evidence of thrombosis involving the right common iliac vein there is no evidence of thrombosis involving the right external iliac vein the left external vein is also patent. The right common iliac vein stent appears patent bilateral common femoral veins are compressible and appear patent. I was concerned about the left common iliac stent however it looks like this is functional. She has some edema in the posterior thigh that was tender she still has that this week. I also note they had trouble finding the pulses in her left foot and booked her for an ABI baseline in 4 weeks. She will follow up in 6 months for repeat IVC duplex. The patient stopped the pentoxifylline because of diarrhea. It does not look like that was being effective in any case. I have advised her to go back on her aspirin 81 mg tablet, vascular it also suggested this 3/7; comes in today with her wound surfaces a lot better. The excoriations from last week considerably better probably secondary to the TCA. We have been using silver alginate Electronic Signature(s) Signed: 01/20/2020 5:07:28 PM By: Linton Ham MD Entered By: Linton Ham on 01/20/2020 13:31:23 ROSALINA, TROWBRIDGE ([id number]) -------------------------------------------------------------------------------- Physical Exam Details Patient Name: BRILYNN, ROBBEN. Date of Service: 01/20/2020 12:45 PM Medical Record Number: [id number] Patient Account Number: [id number] Date of Birth/Sex: [birth year] (74 y.o. F) Treating  RN: Cornell Barman Primary Care Provider: [name] Other Clinician: Referring Provider: [name] Treating Provider/Extender: Tito Dine in Treatment: 76 Constitutional Patient is hypertensive.. Pulse regular and within target range for patient.Marland Kitchen Respirations regular, non-labored and within target range.. Temperature is normal and within the target range for the patient.Marland Kitchen appears in no distress. Cardiovascular Edema present in both extremities.. Notes Wound exam; both sides medially and laterally better. Irritation/inflammation much better. Still some debris on the wound surfaces although I elected not to debride this this week. The actual surface area of the wounds is improved also. I elected not to do mechanical debridement on this this week Electronic Signature(s) Signed: 01/20/2020 5:07:28 PM By: Linton Ham MD Entered By: Linton Ham on 01/20/2020 13:34:40 Lever, [name] ([id number]) -------------------------------------------------------------------------------- Physician Orders Details Patient Name: ADREANA, VARNER. Date of Service: 01/20/2020 12:45 PM Medical Record Number: [id number] Patient Account Number: [id number] Date of Birth/Sex: [birth year] (74 y.o. F) Treating RN: Cornell Barman Primary Care Provider: [name] Other Clinician: Referring Provider: [name] Treating Provider/Extender: Tito Dine in Treatment: 40 Verbal / Phone Orders: No Diagnosis Coding Wound Cleansing Wound #5 Left,Medial Lower Leg o anasept - in clinic Wound #6 Left,Lateral Lower Leg o anasept - in clinic Wound #9 Left,Lateral,Posterior Lower Leg o anasept - in clinic Anesthetic (add to Medication List) Wound #5 Left,Medial Lower Leg o Topical Lidocaine 4% cream applied to wound bed prior to debridement (In Clinic Only). Wound #6 Left,Lateral Lower Leg o Topical Lidocaine 4% cream applied to wound bed prior to debridement (In  Clinic Only). Wound #9 Left,Lateral,Posterior Lower Leg o Topical Lidocaine 4% cream applied to wound bed prior to debridement (In Clinic Only). Skin Barriers/Peri-Wound Care Wound #5 Left,Medial Lower Leg o Triamcinolone Acetonide Ointment (TCA) Wound #6 Left,Lateral Lower Leg o Triamcinolone Acetonide Ointment (TCA) Wound #9  Left,Lateral,Posterior Lower Leg o Triamcinolone Acetonide Ointment (TCA) Primary Wound Dressing Wound #5 Left,Medial Lower Leg o Alginate Wound #6 Left,Lateral Lower Leg o Alginate Wound #9 Left,Lateral,Posterior Lower Leg o Alginate Secondary Dressing Wound #5 Left,Medial Lower Leg o Drawtex Wound #6 Left,Lateral Lower Leg o Drawtex Wound #9 Left,Lateral,Posterior Lower Leg o Drawtex Dressing Change Frequency Wound #5 Left,Medial Lower Leg o Change dressing every week NAMIKO, STEEDMAN ([id number]) o Other: - Friday after vascular appointment Wound #6 Left,Lateral Lower Leg o Change dressing every week o Other: - Friday after vascular appointment Wound #9 Left,Lateral,Posterior Lower Leg o Change dressing every week o Other: - Friday after vascular appointment Follow-up Appointments Wound #5 Left,Medial Lower Leg o Return Appointment in 1 week. o Nurse Visit as needed Wound #6 Left,Lateral Lower Leg o Return Appointment in 1 week. o Nurse Visit as needed Wound #9 Left,Lateral,Posterior Lower Leg o Return Appointment in 1 week. o Nurse Visit as needed Edema Control Wound #5 Left,Medial Lower Leg o 4-Layer Compression System - Left Lower Extremity. o Elevate legs to the level of the heart and pump ankles as often as possible Wound #6 Left,Lateral Lower Leg o 4-Layer Compression System - Left Lower Extremity. o Elevate legs to the level of the heart and pump ankles as often as possible Wound #9 Left,Lateral,Posterior Lower Leg o 4-Layer Compression System - Left Lower Extremity. o Elevate  legs to the level of the heart and pump ankles as often as possible Electronic Signature(s) Signed: 01/20/2020 5:07:28 PM By: Linton Ham MD Signed: 01/22/2020 5:21:44 PM By: [name], [name], RN, CWS, [name], [name] Entered By: [name], [name], RN, CWS, Kim on 01/20/2020 13:14:12 LUNABELLE, CARUCCI ([id number]) -------------------------------------------------------------------------------- Problem List Details Patient Name: PITA, MACFADYEN. Date of Service: 01/20/2020 12:45 PM Medical Record Number: [id number] Patient Account Number: [id number] Date of Birth/Sex: [birth year] (74 y.o. 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 Date of Service: 01/20/2020 12:45 PM Medical Record Number: [id number] Patient Account Number: [id number] Date of Birth/Sex: Feb 27, 1946 (74 y.o. F) Treating RN: Cornell Barman Primary  Care Provider: [name] Other Clinician: Referring Provider: [name] Treating Provider/Extender: Tito Dine in Treatment: 70 Subjective History of Present Illness (HPI) Pleasant 74 year old with history of chronic venous insufficiency. No diabetes or peripheral vascular disease. Left ABI 1.29. Questionable history of left lower extremity DVT. She developed a recurrent ulceration on her left lateral calf in December 2015, which she attributes to poor diet and subsequent lower extremity edema. She underwent endovenous laser ablation of her left greater saphenous vein in 2010. She underwent laser ablation of accessory branch of left GSV in April 2016 by Dr. Kellie Simmering at [facility].  She was previously wearing Unna boots, which she tolerated well. Tolerating 2 layer compression and cadexomer iodine. She returns to clinic for follow-up and is without new complaints. She denies any significant pain at this time. She reports persistent pain with pressure. No claudication or ischemic rest pain. No fever or chills. No drainage. READMISSION 11/13/16; this is a 74 year old woman who is not a diabetic. She is here for a review of a painful area on her left medial lower extremity. I note that she was seen here previously last year for wound I believe to be in the same area. At that time she had undergone previously a left greater saphenous vein ablation by Dr. Kellie Simmering and she had a ablation of the anterior accessory branch of the left greater saphenous vein in March 2016. Seeing that the wound actually closed over. In reviewing the history with her today the ulcer in this area has been recurrent. She describes a biopsy of this area in 2009 that only showed stasis physiology. 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 Do not see that she has ever had arterial studies in the left leg. She tells me she has a follow-up with Dr. Kellie Simmering in roughly 10 days In any case she developed the reopening of this area roughly a month ago. On the background of this she describes rapidly increasing edema which has responded to Lasix 40 mg and metolazone 2.5 mg as well as the patient's lymph massage. She has been told she has both venous insufficiency and lymphedema but she cannot tolerate compression stockings 11/28/16; the patient saw Dr. Kellie Simmering recently. Per the patient he did arterial Dopplers in the office that did not show evidence of arterial insufficiency, per the patient he stated "treat this like an ordinary venous ulcer". She also saw her dermatologist Dr. Ronnald Ramp who felt that this was more of a vascular ulcer. In general things are improving although she arrives today with increasing bilateral lower extremity edema with weeping a deeper fluid through the wound on the left medial leg compatible with some degree of lymphedema 12/04/16; the patient's wound is fully epithelialized but I don't think fully healed. We will do another week of depression with Promogran and TCA however I suspect we'll be able to discharge her next week. This is a very unusual-looking wound which was initially a figure-of-eight type wound lying on its side surrounded by petechial like hemorrhage. She has had venous ablation on this side. She apparently does not have an arterial issue per Dr. Kellie Simmering. She saw her dermatologist thought it was "vascular". Patient is definitely going to need ongoing compression and I talked about this with her today she will go to elastic therapy after she leaves here next week 12/11/16; the patient's wound is not completely closed today. She has surrounding scar tissue and in further discussion with the patient it would appear that she had ulcers in this area in 2009 for a  prolonged period of time ultimately requiring a punch biopsy of this area that only showed venous insufficiency. I did not previously pickup on this part of the history from the patient. 12/18/16; the patient's wound is completely epithelialized. There is no open area here. She has significant bilateral venous insufficiency with secondary lymphedema to a mild-to-moderate  degree she does not have compression stockings.. She did not say anything to me when I was in the room, she told our intake nurse that she was still having pain in this area. This isn't unusual recurrent small open area. She is going to go to elastic therapy to obtain compression stockings. 12/25/16; the patient's wound is fully epithelialized. There is no open area here. The patient describes some continued episodic discomfort in this area medial left calf. However everything looks fine and healed here. She is been to elastic therapy and caught herself 15-20 mmHg stockings, they apparently were having trouble getting 20-30 mm stockings in her size 01/22/17; this is a patient we discharged from the clinic a month ago. She has a recurrent open wound on her medial left calf. She had 15 mm support stockings. I told her I thought she needed 20-30 mm compression stockings. She tells me that she has been ill with hospitalization secondary to asthma and is been found to have severe hypokalemia likely secondary to a combination of Lasix and metolazone. This morning she noted blistering and leaking fluid on the posterior part of her left leg. She called our intake nurse urgently and we was saw her this afternoon. She has not had any real discomfort here. I don't know that she's been wearing any stockings on this leg for at least 2-3 days. ABIs in this clinic were 1.21 on the right and 1.3 on the left. She is previously seen vascular surgery who does not think that there is a peripheral arterial issue. 01/30/17; Patient arrives with no open wound  on the left leg. She has been to elastic therapy and obtained 20-74mmhg below knee stockings and she has one on the right leg today. READMISSION 02/19/18; this Baum is a now 74 year old patient we've had in this clinic perhaps 3 times before. I had last looked at her from January 07 December 2016 with an area on the medial left leg. We discharged her on 12/25/16 however she had to be readmitted on 01/22/17 with a recurrence. I have in my notes that we discharged her on 20-30 mm stockings although she tells me she was only wearing support hose because she cannot get stockings on predominantly related to her cervical spine surgery/issues. She has had previous ablations done by vein and vascular in [place] including a great saphenous vein ablation on the left with an anterior accessory branch ablation I think both of these were in 2016. On one of the previous visit she had a biopsy noted 2009 that was negative. She is not felt to have an arterial issue. She is not a diabetic. She does have a history of obstructive sleep apnea hypertension asthma as well as chronic venous insufficiency and lymphedema. LULABELLE, DANCER ([id number]) On this occasion she noted 2 dry scaly patch on her left leg. She tried to put lotion on this it didn't really help. There were 2 open areas.the patient has been seeing her primary physician from 02/05/18 through 02/14/18. She had Unna boots applied. The superior wound now on the lateral left leg has closed but she's had one wound that remains open on the lateral left leg. This is not the same spot as we dealt with in 2018. ABIs in this clinic were 1.3 bilaterally 02/26/18; patient has a small wound on the left lateral calf. Dimensions are down. She has chronic venous insufficiency and lymphedema. 03/05/18; small open area on the left lateral calf. Dimensions are down. Tightly adherent necrotic debris  over the surface of the wound which was difficult to remove. Also the dressing  [over collagen] stuck to the wound surface. This was removed with some difficulty as well. Change the primary dressing to Hydrofera Blue ready 03/12/18; small open area on the left lateral calf. Comes in with tightly adherent surface eschar as well as some adherent Hydrofera Blue. 03/19/18; open area on the left lateral calf. Again adherent surface eschar as well as some adherent Hydrofera Blue nonviable subcutaneous tissue. She complained of pain all week even with the reduction from 4-3 layer compression I put on last week. Also she had an increase in her ankle and calf measurements probably related to the same thing. 03/26/18; open area on the left lateral calf. A very small open area remains here. We used silver alginate starting last week as the Hydrofera Blue seem to stick to the wound bed. In using 4-layer compression 04/02/18; the open area in the left lateral calf at some adherent slough which I removed there is no open area here. We are able to transition her into her own compression stocking. Truthfully I think this is probably his support hose. However this does not maintain skin integrity will be limited. She cannot put over the toe compression stockings on because of neck problems hand problems etc. She is allergic to the lining layer of juxta lites. We might be forced to use extremitease stocking should this fail READMIT 11/24/2018 Patient is now a 74 year old woman who is not a diabetic. She has been in this clinic on at least 3 previous occasions largely with recurrent wounds on her left leg secondary to chronic venous insufficiency with secondary lymphedema. Her situation is complicated by inability to get stockings on and an allergy to neoprene which is apparently a component and at least juxta lites and other stockings. As a result she really has not been wearing any stockings on her legs. She tells [place] that roughly 2 or 3 weeks ago she started noticing a stinging sensation just above  her ankle on the left medial aspect. She has been diagnosed with pseudogout and she wondered whether this was what she was experiencing. She tried to dress this with something she bought at the store however subsequently it pulled skin off and now she has an open wound that is not improving. She has been using Vaseline gauze with a cover bandage. She saw her primary doctor last week who put an Haematologist on her. ABIs in this clinic was 1.03 on the left 2/12; the area is on the left medial ankle. Odd-looking wound with what looks to be surface epithelialization but a multitude of small petechial openings. This clearly not closed yet. We have been using silver alginate under 3 layer compression with TCA 2/19; the wound area did not look quite as good this week. Necrotic debris over the majority of the wound surface which required debridement. She continues to have a multitude of what looked to be small petechial openings. She reminds [place] that she had a biopsy on this initially during her first outbreak in 2015 in [facility]. She expresses concern about this being a possible melanoma. 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 I assured her that this does not look anything like melanoma. Except for the petechial reaction it does look like a venous insufficiency area and she certainly has evidence of this on both sides 2/26; a difficult area  on the left medial ankle. The patient clearly has chronic venous hypertension with some degree of lymphedema. The odd thing about the area is the small petechial hemorrhages. I am not really sure how to explain this. This was present last time and this is not a compression injury. We have been using Hydrofera Blue which I changed to last week 3/4; still using Hydrofera Blue. Aggressive debridement today. She does not have known arterial issues. She has seen Dr. Kellie Simmering at  [facility] vein and vascular and and has an ablation on the left. [Anterior accessory branch of the greater saphenous]. From what I remember they did not feel she had an arterial issue. The patient has had this area biopsied in 2009 at [facility] dermatology and by her recollection they said this was "stasis". She is also follow-up with dermatology locally who thought that this was more of a vascular issue 3/11; using Hydrofera Blue. Aggressive debridement today. She does not have an arterial issue. We are using 3 layer compression although we may need to go to 4. The patient has been in for multiple changes to her wrap since I last saw her a week ago. She says that the area was leaking. I do not have too much more information on what was found 01/19/19 on evaluation today patient was actually being seen for a nurse visit when unfortunately she had the area on her left lateral lower extremity as well as weeping from the right lower extremity that became apparent. Therefore we did end up actually seeing her for a full visit with myself. She is having some pain at this site as well but fortunately nothing too significant at this point. No fevers, chills, nausea, or vomiting noted at this time. 3/18-Patient is back to the clinic with the left leg venous leg ulcer, the ulcer is larger in size, has a surface that is densely adherent with fibrinous tissue, the Hydrofera Blue was used but is densely adherent and there was difficulty in removing it. The right lower extremity was also wrapped for weeping edema. Patient has a new area over the left lateral foot above the malleolus that is small and appears to have no debris with intact surrounding skin. Patient is on increased dose of Lasix also as a means to edema management 3/25; the patient has a nonhealing venous ulcer on the medial left leg and last week developed a smaller area on the lateral left calf. We have been using Hydrofera Blue with a contact  layer. 4/1; no major change in these wounds areas. Left medial and more recently left lateral calf. I tried Iodoflex last week to aid in debridement she did not tolerate this. She stated her pain was terrible all week. She took the top layer of the 4 layer compression off. 4/8; the patient actually looks somewhat better in terms of her more prominent left lateral calf wound. There is some healthy looking tissue here. She is still complaining of a lot of discomfort. 4/15; patient in a lot of pain secondary to sciatica. She is on a prednisone taper prescribed by her primary physician. She has the 2 areas one on the left medial and more recently a smaller area on the left lateral calf. Both of these just above the malleoli 4/22; her back pain is better but she still states she is very uncomfortable and now feels she is intolerant to the [employer]. No real change in the wounds we have been using Sorbact. She has been previously intolerant  to Iodoflex. There is not a lot of option about what we can use to debride this wound under compression that she no doubt needs. sHe states Ultram no longer works for her pain NAGMA, DICUS ([id number]) 4/29; no major change in the wounds slightly increased depth. Surface on the original medial wound perhaps somewhat improved however the more recent area on the lateral left ankle is 100% covered in very adherent debris we have been using Sorbact. She tolerates 4 layer compression well and her edema control is a lot better. She has not had to come in for a nurse check 5/6; no major change in the condition of the wounds. She did consent to debridement today which was done with some difficulty. Continuing Sorbact. She did not tolerate Iodoflex. She was in for a check of her compression the day after we wrapped her last week this was adjusted but nothing much was found 5/13; no major change in the condition or area of the wounds. I was able to get a fairly aggressive  debridement done on the lateral left leg wound. Even using Sorbact under compression. She came back in on Friday to have the wrap changed. She says she felt uncomfortable on the lateral aspect of her ankle. She has a long history of chronic venous insufficiency including previous ablation surgery on this side. 5/20-Patient returns for wounds on left leg with both wounds covered in slough, with the lateral leg wound larger in size, she has been in 3 layer compression and felt more comfortable, she describes pain in ankle, in leg and pins and needles in foot, and is about to try Pamelor for this 6/3; wounds on the left lateral and left medial leg. The area medially which is the most recent of the 2 seems to have had the largest increase in dimensions. We have been using Sorbac to try and debride the surface. She has been to see orthopedics they apparently did a plain x-ray that was indeterminant. Diagnosed her with neuropathy and they have ordered an MRI to determine if there is underlying osteomyelitis. This was not high on my thought list but I suppose it is prudent. We have advised her to make an appointment with vein and vascular in [place]. She has a history of a left greater saphenous and accessory vein ablations I wonder if there is anything else that can be done from a surgical point of view to help in these difficult refractory wounds. We have previously healed this wound on one occasion but it keeps on reopening [medial side] 6/10; deep tissue culture I did last week I think on the left medial wound showed both moderate E. coli and moderate staph aureus [MSSA]. She is going to require antibiotics and I have chosen Augmentin. We have been using Sorbact and we have made better looking wound surface on both sides but certainly no improvement in wound area. She was back in last Friday apparently for a dressing changes the wrap was hurting her outer left ankle. She has not managed to get a hold  of vein and vascular in [place]. We are going to have to make her that appointment 6/17; patient is tolerating the Augmentin. She had an MRI that I think was ordered by orthopedic surgeon this did not show osteomyelitis or an abscess did suggest cellulitis. We have been using Sorbact to the lateral and medial ankles. We have been trying to arrange a follow-up appointment with vein and vascular in [place] or did her original  ablations. We apparently an area sent the request to vein and vascular in [facility] 6/24; patient has completed the Augmentin. We do not yet have a vein and vascular appointment in [place]. I am not sure what the issue is here we have asked her to call tomorrow. We are using Sorbact. Making some improvements and especially the medial wound. Both surfaces however look better medial and lateral. 7/1; the patient has been in contact with vein and vascular in [place] but has not yet received an appointment. Using Sorbact we have gradually improve the wound surface with no improvement in surface area. She is approved for Apligraf but the wound surface still is not completely viable. She has not had to come in for a dressing change 7/8; the patient has an appointment with vein and vascular on 7/31 which is a Friday afternoon. She is concerned about getting back here for [place] to dress her wounds. I think it is important to have them goal for her venous reflux/history of ablations etc. to see if anything else can be done. She apparently tested positive for 1 of the blood tests with regards to lupus and saw a rheumatologist. He has raised the issue of vasculitis again. I have had this thought in the past however the evidence seems overwhelming that this is a venous reflux etiology. If the rheumatologist tells me there is clinical and laboratory investigation is positive for lupus I will rethink this. 7/15; the patient's wound surfaces are quite a bit better. The medial area  which was her original wound now has no depth although the lateral wound which was the more recent area actually appears larger. Both with viable surfaces which is indeed better. Using Sorbact. I wanted to use Apligraf on her however there is the issue of the vein and vascular appointment on 7/31 at 2:00 in the afternoon which would not allow her to get back to be rewrapped and they would no doubt remove the graft 7/22; the patient's wound surfaces have moderate amount of debris although generally look better. The lateral one is larger with 2 small satellite areas superiorly. We are waiting for her vein and vascular appointment on 7/31. She has been approved for Apligraf which I would like to use after th 7/29; wound surfaces have improved no debridement is required we have been using Sorbact. She sees vein and vascular on Friday with this so question of whether anything can be done to lessen the likelihood of recurrence and/or speed the healing of these areas. She is already had previous ablations. She no doubt has severe venous hypertension 8/5-Patient returns at 1 week, she was in [place] for 3 days by her podiatrist, we have been using so backed to the wound, she has increased pain in both the wounds on the left lower leg especially the more distal one on the lateral aspect 8/12-Patient returns at 1 week and she is agreeable to having debridement in both wounds on her left leg today. We have been using Sorbact, and vascular studies were reviewed at last visit 8/19; the patient arrives with her wounds fairly clean and no debridement is required. We have used Sorbact which is really done a nice job in cleaning up these very difficult wound surfaces. The patient saw Dr. Donzetta Matters of vascular surgery on 7/31. He did not feel that there was an arterial component. He felt that her treated greater saphenous vein is adequately addressed and that the small saphenous vein did not appear to be involved  significantly. She was also noted to have deep venous reflux which is not treatable. Dr. Donzetta Matters mentioned the possibility of a central obstructive component leading to reflux and he offered her central venography. She wanted to discuss this or think about it. I have urged her to go ahead with this. She has had recurrent difficult wounds in these areas which do heal but after months in the clinic. If there is anything that can be done to reduce the likelihood of this I think it is worth it. 9/2 she is still working towards getting follow-up with Dr. Donzetta Matters to schedule her CT. Things are quite a bit worse venography. I put Apligraf on 2 weeks ago on both wounds on the medial and lateral part of her left lower leg. She arrives in clinic today with 3 superficial additional wounds above the area laterally and one below the wound medially. She describes a lot of discomfort. I think these are probably wrapped injuries. Does not look like she has cellulitis. 07/20/2019 on evaluation today patient appears to be doing somewhat poorly in regard to her lower extremity ulcers. She in fact showed signs of erythema in fact we may even be dealing with an infection at this time. Unfortunately I am unsure if this is just infection or if indeed there may be some allergic reaction that occurred as a result of the Apligraf application. With that being said that would be unusual but nonetheless not impossible in this patient is one who is unfortunately allergic to quite a bit. Currently we have been using the Sorbact which seems to do as well as anything for her. I do think we may want to obtain a culture today to see if there is anything showing up there that may need to be addressed. 9/16; noted that last week the wounds look worse in 1 week follow-up of the Apligraf. Using Sorbact as of 2 days ago. She arrives with copious amounts of drainage and new skin breakdown on the back of the left calf. The wounds arm more  substantial bilaterally. There is a fair amount of swelling in the left calf no overt DVT there is edema present I think in the left greater than right thigh. She is supposed to go on 9/28 for CT TAJIA, REICHER. ([id number]) venography. The wounds on the medial and lateral calf are worse and she has new skin breakdown posteriorly at least new for me. This is almost developing into a circumferential wound area The Apligraf was taken off last week which I agree with things are not going in the right direction a culture was done we do not have that back yet. She is on Augmentin that she started 2 days ago 9/23; dressing was changed by her nurses on Monday. In general there is no improvement in the wound areas although the area looks less angry than last week. She did get Augmentin for MSSA cultured on the 14th. She still appears to have too much swelling in the left leg even with 3 layer compression 9/30; the patient underwent her procedure on 9/28 by Dr. Donzetta Matters at vascular and vein specialist. She was discovered to have the common iliac vein measuring 12.2 mm but at the level of L4-L5 measured 3 mm. After stenting it measured 10 mm. 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 Rouleaux flow in the common femoral and femoral vein was observed much improved after stenting. We are using silver alginate to the wounds on the medial and  lateral ankle on the left. 4 layer compression 10/7; the patient had fluid swelling around her knee and 4 layer compression. At the advice of vein and vascular this was reduced to 3 layer which she is tolerating better. We have been using silver alginate under 3 layer compression since last Friday 10/14; arrives with the areas on the left ankle looking a lot better. Inflammation in the area also a lot better. She came in for a nurse check on 10/9 10/21; continued nice improvement. Slight improvements in surface area of both the medial and lateral wounds on the  left. A lot of the satellite lesions in the weeping erythema around these from stasis dermatitis is resolved. We have been using silver alginate 10/28; general improvement in the entire wound areas although not a lot of change in dimensions the wound certainly looks better. There is a lot less in terms of venous inflammation. Continue silver alginate this week however look towards Hydrofera Blue next week 11/4; very adherent debris on the medial wound left wound is not as bad. We have been using silver alginate. Change to [facility] today 11/11; very adherent debris on both wound areas. She went to vein and vascular last week and follow-up they put in [place] boot on this today. He says the [facility] was adherent. Wound is definitely not as good as last week. Especially on the left there the satellite lesions look more prominent 11/18; absolutely no better. erythema on lateral aspect with tenderness. 09/30/2019 on evaluation today patient appears to actually be doing better. Dr. Dellia Nims did put her on doxycycline last week which I do believe has helped her at this point. Fortunately there is no signs of active infection at this time. No fevers, chills, nausea, vomiting, or diarrhea. I do believe he may want extend the doxycycline for 7 additional days just to ensure everything does completely cleared up the patient is in agreement with that plan. Otherwise she is going require some sharp debridement today 12/2; patient is completing a 2-week course of doxycycline. I gave her this empirically for inflammation as well as infection when I last saw her 2 weeks ago. All of this seems to be better. She is using silver alginate she has the area on the medial aspect of the larger area laterally and the 2 small satellite regions laterally above the major wound. 12/9; the patient's wound on the left medial and left lateral calf look really quite good. We have been using silver alginate. She saw vein and  vascular in follow-up on 10/09/2019. She has had a previous left greater saphenous vein ablation by Dr. Oscar La in 2016. More recently she underwent a left common iliac vein stent by Dr. 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 The swelling is improved and certainly the wounds have improved. The patient shows [place] today area on the right medial calf there is almost no wound but leaking lymphedema. She says she start this started 3 or 4 days ago. She did not traumatize it. It is not painful. She does not wear compression on that side 12/16; the patient continues to do well laterally. Medially still requiring debridement. The area on the right calf did not materialize to anything and is not currently open. We wrapped this last time. She has support stockings for that leg although I am not sure they are going to provide adequate compression 12/23; the lateral wound looks stable. Medially still requiring debridement for tightly adherent fibrinous debris. We've been  using silver alginate. Surface area not any different 12/30; neither wound is any better with regards to surface and the area on the left lateral is larger. I been using silver alginate to the left lateral which look quite good last week and Sorbact to the left medial 11/11/2019. Lateral wound area actually looks better and somewhat smaller. Medial still requires a very aggressive debridement today. We have been using Sorbact on both wound areas 1/13; not much better still adherent debris bilaterally. I been using Sorbact. She has severe venous hypertension. Probably some degree of dermal fibrosis distally. I wonder whether tighter compression might help and I am going to try that today. We also need to work on the bioburden 1/20; using Sorbact. She has severe venous hypertension status post stent placement for pelvic vein compression. We applied gentamicin last time to see if we could reduce bioburden I had some discussion with her  today about the use of pentoxifylline. This is occasionally used in this setting for wounds with refractory venous insufficiency. However this interacts with Plavix. She tells me that she was put on this after stent placement for 3 months. She will call Dr. Claretha Cooper office to discuss 1/27; we are using gentamicin under Sorbact. 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 She has a stent. Wound medially is measuring smaller this week. Laterally measuring slightly larger although she has some satellite lesions superiorly 2/3; gentamicin under Sorbact under 4-layer compression. 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 She has a stent on Plavix. Her wounds are measuring smaller this week. More substantially laterally where there is a satellite lesion superiorly. 2/10; gentamicin under Sorbac. 4-layer compression. Patient communicated with Dr. Donzetta Matters at vein and vascular in [place]. He is okay with the patient coming off Plavix I will therefore start her on pentoxifylline for a 1 month trial. In general her wounds look better today. I had some concerns about swelling in the left thigh however she measures 61.5 on the right and 63 on the mid thigh which does not suggest there is any difficulty. The patient is not describing any pain. 2/17; gentamicin under Sorbac 4-layer compression. She has been on pentoxifylline for 1 week and complains of loose stool. No nausea she is eating and drinking well 2/24; the patient apparently came in 2 days ago for a nurse visit when her wrap fell down. Both areas look a little worse this week macerated medially and satellite lesions laterally. Change to silver alginate today 3/3; wounds are larger today especially medially. She also has more swelling in her foot lower leg and I even noted some swelling in her posterior thigh which is tender. I wonder about the patency of her stent. Fortuitously she sees Dr. Claretha Cooper group on  Friday 3/10; Mrs. Andler was seen by vein and vascular on 3/5. The patient underwent ultrasound. There was no evidence of thrombosis involving the IVC no evidence of thrombosis involving the right common iliac vein there is no evidence of thrombosis involving the right external iliac vein the left external Santillo, Henessy J. ([id number]) vein is also patent. The right common iliac vein stent appears patent bilateral common femoral veins are compressible and appear patent. I was concerned about the left common iliac stent however it looks like this is functional. She has some edema in the posterior thigh that was tender she still has that this week. I also note they had trouble finding the pulses in her left foot and booked her for an  ABI baseline in 4 weeks. She will follow up in 6 months for repeat IVC duplex. The patient stopped the pentoxifylline because of diarrhea. It does not look like that was being effective in any case. I have advised her to go back on her aspirin 81 mg tablet, vascular it also suggested this 3/7; comes in today with her wound surfaces a lot better. The excoriations from last week considerably better probably secondary to the TCA. We have been using silver alginate Objective Constitutional Patient is hypertensive.. Pulse regular and within target range for patient.Marland Kitchen Respirations regular, non-labored and within target range.. Temperature is normal and within the target range for the patient.Marland Kitchen appears in no distress. Vitals Time Taken: 12:50 PM, Height: 63 in, Weight: 224.7 lbs, BMI: 39.8, Temperature: 98.6 F, Pulse: 77 bpm, Respiratory Rate: 18 breaths/min, Blood Pressure: 147/60 mmHg. Cardiovascular Edema present in both extremities.. General Notes: Wound exam; both sides medially and laterally better. Irritation/inflammation much better. Still some debris on the wound surfaces although I elected not to debride this this week. The actual surface area of the wounds is  improved also. I elected not to do mechanical debridement on this this week Integumentary (Hair, Skin) Wound #5 status is Open. Original cause of wound was Gradually Appeared. The wound is located on the Left,Medial Lower Leg. The wound measures 2.1cm length x 1.6cm width x 0.2cm depth; 2.639cm^2 area and 0.528cm^3 volume. There is Fat Layer (Subcutaneous Tissue) Exposed exposed. There is no tunneling or undermining noted. There is a medium amount of serosanguineous drainage noted. The wound margin is flat and intact. There is large (67-100%) red granulation within the wound bed. There is a small (1-33%) amount of necrotic tissue within the wound bed including Adherent Slough. Wound #6 status is Open. Original cause of wound was Gradually Appeared. The wound is located on the Left,Lateral Lower Leg. The wound measures 4.2cm length x 2cm width x 0.2cm depth; 6.597cm^2 area and 1.319cm^3 volume. There is Fat Layer (Subcutaneous Tissue) Exposed exposed. There is no tunneling or undermining noted. There is a medium amount of serosanguineous drainage noted. The wound margin is flat and intact. There is medium (34-66%) red granulation within the wound bed. There is a medium (34-66%) amount of necrotic tissue within the wound bed including Adherent Slough. Wound #9 status is Open. Original cause of wound was Gradually Appeared. The wound is located on the Left,Lateral,Posterior Lower Leg. The wound measures 0.9cm length x 0.6cm width x 0.1cm depth; 0.424cm^2 area and 0.042cm^3 volume. There is Fat Layer (Subcutaneous Tissue) Exposed exposed. There is no tunneling or undermining noted. There is a medium amount of serosanguineous drainage noted. There is medium (34-66%) red granulation within the wound bed. There is a medium (34-66%) amount of necrotic tissue within the wound bed including Adherent Slough. 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 Wound #6 Left,Lateral Lower Leg: Topical Lidocaine 4% cream applied to wound bed prior to debridement (In  Clinic Only). Wound #9 Left,Lateral,Posterior Lower Leg: Topical Lidocaine 4% cream applied to wound bed prior to debridement (In Clinic Only). Skin Barriers/Peri-Wound Care: Wound #5 Left,Medial Lower Leg: Triamcinolone Acetonide Ointment (TCA) Wound #6 Left,Lateral Lower Leg: Triamcinolone Acetonide Ointment (TCA) Wound #9 Left,Lateral,Posterior Lower Leg: Triamcinolone Acetonide Ointment (TCA) Primary Wound Dressing: Wound #5 Left,Medial Lower Leg: Alginate Wound #6 Left,Lateral Lower Leg: Alginate Wound #9 Left,Lateral,Posterior Lower Leg: Alginate Secondary Dressing: Wound #5 Left,Medial Lower Leg: Drawtex Wound #6 Left,Lateral Lower Leg: Drawtex Wound #9 Left,Lateral,Posterior Lower Leg: Drawtex Dressing Change Frequency: Wound #5 Left,Medial Lower Leg: Change dressing every week Other: - Friday after vascular appointment Wound #6 Left,Lateral Lower Leg: Change dressing every week Other: - Friday after vascular appointment Wound #9 Left,Lateral,Posterior Lower Leg: Change dressing every week Other: - Friday after vascular appointment Follow-up Appointments: Wound #5 Left,Medial Lower Leg: Return Appointment in 1 week. Nurse Visit as needed Wound #6 Left,Lateral Lower  Leg: Return Appointment in 1 week. Nurse Visit as needed Wound #9 Left,Lateral,Posterior Lower Leg: Return Appointment in 1 week. Nurse Visit as needed Edema Control: Wound #5 Left,Medial Lower Leg: 4-Layer Compression System - Left Lower Extremity. Elevate legs to the level of the heart and pump ankles as often as possible Wound #6 Left,Lateral Lower Leg: 4-Layer Compression System - Left Lower Extremity. Elevate legs to the level of the heart and pump ankles as often as possible Wound #9 Left,Lateral,Posterior Lower Leg: 4-Layer Compression System - Left Lower Extremity. Elevate legs to the level of the heart and pump ankles as often as possible Shimko, Kasey J. ([id number]) 1. I am continuing with TCA silver alginate/drawtex under compression 2. She comes back as needed for a dressing change 3. May be debridement next week. The surface certainly has debris but the whole thing was so much better I elected not to debride this Electronic Signature(s) Signed: 01/20/2020 5:07:28 PM By: Linton Ham MD Entered By: Linton Ham on 01/20/2020 13:43:20 Vitelli, [name] -------------------------------------------------------------------------------- SuperBill Details Patient Name: BRITTIN, PICH. Date of Service: 01/20/2020 Medical Record Number: [id number] Patient Account Number: [id number] Date of Birth/Sex: 1946/10/19 (74 y.o. F) Treating RN: Cornell Barman Primary Care Provider: [name] Other Clinician: Referring Provider: [name] Treating Provider/Extender: Tito Dine in Treatment: 58 Diagnosis Coding ICD-10 Codes Code Description [phone] Non-pressure chronic ulcer of left calf limited to breakdown of skin I87.332 Chronic venous hypertension (idiopathic) with ulcer and inflammation of left lower extremity I89.0 Lymphedema, not elsewhere classified I82.592 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 lower  extremity Facility Procedures CPT4 Code: [id number] Description: (Facility Use Only) 29581LT - [place] LWR LT LEG Modifier: Quantity: 1 Physician Procedures CPT4 Code: [id number] Description: [id number] - WC PHYS LEVEL 3 - EST PT Modifier: Quantity: 1 CPT4 Code: Description: ICD-10 Diagnosis Description L97.221 Non-pressure chronic ulcer of left calf limited to breakdown of skin I87.332 Chronic venous hypertension (idiopathic) with ulcer and inflammation of left l Modifier: ower extremity Quantity: Electronic Signature(s) Signed: 01/20/2020 5:07:28 PM By: Linton Ham MD Entered By: Linton Ham on 01/20/2020 13:43:57

## 2020-01-27 ENCOUNTER — Encounter: Payer: Medicare Other | Admitting: Internal Medicine

## 2020-01-27 ENCOUNTER — Other Ambulatory Visit: Payer: Self-pay

## 2020-01-27 DIAGNOSIS — I87332 Chronic venous hypertension (idiopathic) with ulcer and inflammation of left lower extremity: Secondary | ICD-10-CM | POA: Diagnosis not present

## 2020-01-27 DIAGNOSIS — J45909 Unspecified asthma, uncomplicated: Secondary | ICD-10-CM | POA: Diagnosis not present

## 2020-01-27 DIAGNOSIS — L97221 Non-pressure chronic ulcer of left calf limited to breakdown of skin: Secondary | ICD-10-CM | POA: Diagnosis not present

## 2020-01-27 DIAGNOSIS — I890 Lymphedema, not elsewhere classified: Secondary | ICD-10-CM | POA: Diagnosis not present

## 2020-01-27 DIAGNOSIS — I10 Essential (primary) hypertension: Secondary | ICD-10-CM | POA: Diagnosis not present

## 2020-01-27 DIAGNOSIS — L97222 Non-pressure chronic ulcer of left calf with fat layer exposed: Secondary | ICD-10-CM | POA: Diagnosis not present

## 2020-01-27 DIAGNOSIS — I872 Venous insufficiency (chronic) (peripheral): Secondary | ICD-10-CM | POA: Diagnosis not present

## 2020-01-29 ENCOUNTER — Ambulatory Visit: Payer: 59

## 2020-02-01 ENCOUNTER — Telehealth: Payer: Self-pay

## 2020-02-01 ENCOUNTER — Other Ambulatory Visit: Payer: Self-pay

## 2020-02-01 DIAGNOSIS — I87332 Chronic venous hypertension (idiopathic) with ulcer and inflammation of left lower extremity: Secondary | ICD-10-CM | POA: Diagnosis not present

## 2020-02-01 DIAGNOSIS — L97221 Non-pressure chronic ulcer of left calf limited to breakdown of skin: Secondary | ICD-10-CM | POA: Diagnosis not present

## 2020-02-01 DIAGNOSIS — I872 Venous insufficiency (chronic) (peripheral): Secondary | ICD-10-CM | POA: Diagnosis not present

## 2020-02-01 DIAGNOSIS — I10 Essential (primary) hypertension: Secondary | ICD-10-CM | POA: Diagnosis not present

## 2020-02-01 DIAGNOSIS — J45909 Unspecified asthma, uncomplicated: Secondary | ICD-10-CM | POA: Diagnosis not present

## 2020-02-01 DIAGNOSIS — I890 Lymphedema, not elsewhere classified: Secondary | ICD-10-CM | POA: Diagnosis not present

## 2020-02-01 NOTE — Telephone Encounter (Signed)
[facility] - Client TELEPHONE ADVICE RECORD [facility] Patient Name: [name] Gender: Female DOB: [birth year] Age: 74 Y 54 M 23 D Return Phone Number: [phone] (Secondary) Address: City/State/Zip: [place] [address] Client [facility] Night - Client Client Site [facility] Physician [name] - MD Contact Type Call Who Is Calling Patient / Member / Family / Caregiver Call Type Triage / Clinical Relationship To Patient Self Return Phone Number [phone] (Secondary) Chief Complaint Medication reaction Reason for Call Symptomatic / Request for Health Information Initial Comment Caller reports she thinks she has a reaction to a medication she started 2 days ago for a bacterial infection. Caller reports she broke out in a cold sweat the 1st time she took the medication. Caller reports this morning she started the cold sweat and dizziness after taking it and then had the dry heaves. Medication: Doxycycline. Translation No Nurse Assessment Nurse: [name], RN, Lelan Pons Date/Time [name] Time): 01/30/2020 12:34:09 PM Confirm and document reason for call. If symptomatic, describe symptoms. ---Caller reports she thinks she has a reaction to Doxycycline BID. Taken 3 doses. She has bacterial infection of venous ulcers on leg. Caller reports she broke out in a cold sweat the 1st time she took the medication. Caller reports this morning she started the cold sweat, nausea and dizziness after taking it. Has the patient had close contact with a person known or suspected to have the novel coronavirus illness OR traveled / lives in area with major community spread (including international travel) in the last 14 days from the onset of symptoms? * If Asymptomatic, screen for exposure and travel within the last 14 days. ---No Does the patient have any new or worsening symptoms? ---Yes Will a triage  be completed? ---Yes Related visit to physician within the last 2 weeks? ---No Does the PT have any chronic conditions? (i.e. diabetes, asthma, this includes High risk factors for pregnancy, etc.) ---Yes List chronic conditions. ---HTN, asthma, sleep apnea Is this a behavioral health or substance abuse call? ---No PLEASE NOTE: All timestamps contained within this report are represented as [place] Standard Time. CONFIDENTIALTY NOTICE: This fax transmission is intended only for the addressee. It contains information that is legally privileged, confidential or otherwise protected from use or disclosure. If you are not the intended recipient, you are strictly prohibited from reviewing, disclosing, copying using or disseminating any of this information or taking any action in reliance on or regarding this information. If you have received this fax in error, please notify us immediately by telephone so that we can arrange for its return to [place]. Phone: [phone], Toll-Free: [phone], Fax: [phone] Page: 2 of 2 Call Id: [id number] Guidelines Guideline Title Affirmed Question Affirmed Notes Nurse Date/Time [name] Time) Infection on Antibiotic Follow-up Call [1] Caller has URGENT question AND [2] triager unable to answer question Stormy Card 01/30/2020 12:46:38 PM Disp. Time [name] Time) Disposition Final User 01/30/2020 1:04:41 PM Paged On Call back to [facility], RN, Lelan Pons 01/30/2020 1:13:57 PM Pharmacy Call [name], RN, Lelan Pons Reason: Verbal order for Cephalexin 500 mg TID #30 to [facility] [phone]. 01/30/2020 12:52:37 PM Call PCP Now Yes [name], RN, [place] Disagree/Comply Comply Caller Understands Yes PreDisposition Did not know what to do Care Advice Given Per Guideline CALL PCP NOW: * I'll page the on-call provider now. If you haven't heard from the provider (or me) within 30 minutes, call again. CALL BACK IF: CARE ADVICE given  per Infection on Antibiotics  Follow-Up Call (Adult) guideline. Paging DoctorName Phone DateTime Result/Outcome Message Type Notes Alysia Penna - MD [id number] 01/30/2020 1:04:41 PM Paged On Call Back to Call Center Doctor Paged [phone] Alysia Penna - MD 01/30/2020 1:13:32 PM [facility] Msg Received - Rx - Provider Gave Verbal Order Message Result Verbal order for Cephalexin 500 mg TID #30 to [facility] [phone].

## 2020-02-01 NOTE — Telephone Encounter (Signed)
Lvm asking pt to call back.  Need to relay Dr. G's message.  

## 2020-02-01 NOTE — Telephone Encounter (Signed)
Noted concern over doxy reaction - placed on keflex.  How is she doing on new abx?  Would also encourage her to call wound clinic to notify them (I believe they prescribed doxy).

## 2020-02-02 NOTE — Telephone Encounter (Addendum)
Spoke with pt relaying Dr. Synthia Innocent message.  Verbalizes understanding.  Says she was at wound clinic yesterday and notified them.

## 2020-02-03 ENCOUNTER — Encounter: Payer: Medicare Other | Admitting: Internal Medicine

## 2020-02-03 NOTE — Progress Notes (Signed)
[name], [name] Visit Report for 02/01/2020 Arrival Information Details Patient Name: [name], [name]. Date of Service: 02/01/2020 10:45 AM Medical Record Number: [id number] Patient Account Number: [id number] Date of Birth/Sex: 1946-01-31 (74 y.o. F) Treating [name]: [name] Primary Care [name]: [name] Other Clinician: Referring [name]: [name] Treating [name]/Extender: [name], [name] in Treatment: 27 Visit Information History Since Last Visit Pain Present Now: No Patient Arrived: Ambulatory Arrival Time: 11:05 Accompanied By: self Transfer Assistance: None Patient Identification Verified: Yes Patient Requires Transmission-Based No Precautions: Patient Has Alerts: Yes Patient Alerts: Patient on Blood Thinner aspirin 81 Electronic Signature(s) Signed: 02/03/2020 11:21:31 AM By: [name], [name], [name], CWS, [name] Entered By: [name], [name], [name], CWS, Amber on 02/01/2020 11:06:08 [name] -------------------------------------------------------------------------------- Compression Therapy Details Patient Name: [name], [name]. Date of Service: 02/01/2020 10:45 AM Medical Record Number: [id number] Patient Account Number: [id number] Date of Birth/Sex: January 19, 1946 (74 y.o. F) Treating [name]: [name] Primary Care Nahara Dona: [name] Other Clinician: Referring [name]: [name] Treating [name]/Extender: [name], [name] in Treatment: 59 Compression Therapy Performed for Wound Assessment: Wound #10 Left,Proximal,Lateral Lower Leg Performed By: Clinician [name] Compression Type: Four Layer Pre Treatment ABI: 1 Electronic Signature(s) Signed: 02/03/2020 11:21:31 AM By: [name], [name], [name], CWS, [name] Entered By: [name], [name], [name], CWS, Amber on 02/01/2020 11:20:55 [name] -------------------------------------------------------------------------------- Compression Therapy  Details Patient Name: [name], [name]. Date of Service: 02/01/2020 10:45 AM Medical Record Number: [id number] Patient Account Number: [id number] Date of Birth/Sex: 09-17-1946 (74 y.o. F) Treating [name]: [name] Primary Care [name]: [name] Other Clinician: Referring [name]: [name] Treating [name]/Extender: [name], [name] in Treatment: 59 Compression Therapy Performed for Wound Assessment: Wound #5 Left,Medial Lower Leg Performed By: Clinician [name] Compression Type: Four Layer Pre Treatment ABI: 1 Electronic Signature(s) Signed: 02/03/2020 11:21:31 AM By: [name], [name], [name], CWS, [name] Entered By: [name], [name], [name], CWS, Amber on 02/01/2020 11:20:55 [name] -------------------------------------------------------------------------------- Compression Therapy Details Patient Name: [name], [name]. Date of Service: 02/01/2020 10:45 AM Medical Record Number: [id number] Patient Account Number: [id number] Date of Birth/Sex: 05-Oct-1946 (74 y.o. F) Treating [name]: [name] Primary Care [name]: [name] Other Clinician: Referring [name]: [name] Treating [name]/Extender: [name], [name] in Treatment: 59 Compression Therapy Performed for Wound Assessment: Wound #6 Left,Lateral Lower Leg Performed By: Clinician [name] Compression Type: Four Layer Pre Treatment ABI: 1 Electronic Signature(s) Signed: 02/03/2020 11:21:31 AM By: [name], [name], [name], CWS, [name] Entered By: [name], [name], [name], CWS, Amber on 02/01/2020 11:20:55 [name] -------------------------------------------------------------------------------- Compression Therapy Details Patient Name: [name], [name]. Date of Service: 02/01/2020 10:45 AM Medical Record Number: [id number] Patient Account Number: [id number] Date of Birth/Sex: Nov 28, 1945 (74 y.o. F) Treating [name]: [name] Primary Care [name]: [name] Other  Clinician: Referring Kaitlan Bin: [name] Treating [name]/Extender: [name], [name] in Treatment: 59 Compression Therapy Performed for Wound Assessment: Wound #9 Left,Lateral,Posterior Lower Leg Performed By: Clinician [name] Compression Type: Four Layer Pre Treatment ABI: 1 Electronic Signature(s) Signed: 02/03/2020 11:21:31 AM By: [name], [name], [name], CWS, [name] Entered By: [name], [name], [name], CWS, Amber on 02/01/2020 11:20:55 [name] -------------------------------------------------------------------------------- Encounter Discharge Information Details Patient Name: [name], [name]. Date of Service: 02/01/2020 10:45 AM Medical Record Number: [id number] Patient Account Number: [id number] Date of Birth/Sex: 05/12/1946 (74 y.o. F) Treating [name]: [name] Primary Care [name]:  [name] Other Clinician: Referring [name]: [name] Treating [name]/Extender: [name], [name] in Treatment: 46 Encounter Discharge Information Items Discharge Condition: Stable Ambulatory Status: Ambulatory Discharge Destination: Home Transportation: Private Auto Accompanied By: self Schedule Follow-up Appointment: Yes Clinical Summary of Care: Electronic Signature(s) Signed: 02/03/2020 11:21:31 AM By: [name], [name], [name], CWS, [name] Entered By: [name], [name], [name], CWS, Amber on 02/01/2020 11:24:00 [name] -------------------------------------------------------------------------------- Wound Assessment Details Patient Name: [name], [name]. Date of Service: 02/01/2020 10:45 AM Medical Record Number: [id number] Patient Account Number: [id number] Date of Birth/Sex: 10/14/1946 (74 y.o. F) Treating [name]: [name] Primary Care [name]: [name] Other Clinician: Referring [name]: [name] Treating Blakleigh Straw/Extender: [name], [name] in Treatment: 58 Wound Status Wound Number: 10 Primary Etiology: Venous  Leg Ulcer Wound Location: Left, Proximal, Lateral Lower Leg Wound Status: Open Wounding Event: Blister Date Acquired: 01/27/2020 [name] Of Treatment: 0 Clustered Wound: No Wound Measurements Length: (cm) 0.5 Width: (cm) 0.7 Depth: (cm) 0.1 Area: (cm) 0.275 Volume: (cm) 0.027 % Reduction in Area: 75.7% % Reduction in Volume: 76.1% Wound Description Classification: Full Thickness Without Exposed Support Structure s Treatment Notes Wound #10 (Left, Proximal, Lateral Lower Leg) Notes TCA, Alginate, ABD, 4 layer Electronic Signature(s) Signed: 02/03/2020 11:21:31 AM By: [name], [name], [name], CWS, [name] Entered By: [name], [name], [name], CWS, Amber on 02/01/2020 11:08:31 [name] -------------------------------------------------------------------------------- Wound Assessment Details Patient Name: [name], [name]. Date of Service: 02/01/2020 10:45 AM Medical Record Number: [id number] Patient Account Number: [id number] Date of Birth/Sex: [birth year] (74 y.o. F) Treating [name]: [name] Primary Care Dorian Duval: [name] Other Clinician: Referring Jermie Hippe: [name] Treating Nox Talent/Extender: [name], [name] in Treatment: 59 Wound Status Wound Number: 5 Primary Etiology: Lymphedema Wound Location: Left, Medial Lower Leg Wound Status: Open Wounding Event: Gradually Appeared Date Acquired: 11/19/2018 [name] Of Treatment: 59 Clustered Wound: No Wound Measurements Length: (cm) 1.4 Width: (cm) 1.2 Depth: (cm) 0.2 Area: (cm) 1.319 Volume: (cm) 0.264 % Reduction in Area: 84.6% % Reduction in Volume: 69.1% Wound Description Classification: Full Thickness Without Exposed Support Structure s Treatment Notes Wound #5 (Left, Medial Lower Leg) Notes TCA, Alginate, ABD, 4 layer Electronic Signature(s) Signed: 02/03/2020 11:21:31 AM By: [name], [name], [name], CWS, [name] Entered By: [name], [name], [name], CWS, Amber on 02/01/2020 11:08:31 [name]  ([id number]) -------------------------------------------------------------------------------- Wound Assessment Details Patient Name: [name], [name]. Date of Service: 02/01/2020 10:45 AM Medical Record Number: [id number] Patient Account Number: [id number] Date of Birth/Sex: 1946-10-30 (74 y.o. F) Treating [name]: [name] Primary Care Maxie Slovacek: [name] Other Clinician: Referring Kamariah Fruchter: [name] Treating Amdrew Oboyle/Extender: [name], [name] in Treatment: 59 Wound Status Wound Number: 6 Primary Etiology: Venous Leg Ulcer Wound Location: Left, Lateral Lower Leg Wound Status: Open Wounding Event: Gradually Appeared Date Acquired: 01/19/2019 [name] Of Treatment: 54 Clustered Wound: No Wound Measurements Length: (cm) 3.4 Width: (cm) 1.9 Depth: (cm) 0.2 Area: (cm) 5.074 Volume: (cm) 1.015 % Reduction in Area: -2206.4% % Reduction in Volume: -4513.6% Wound Description Classification: Full Thickness Without Exposed Support Structure s Treatment Notes Wound #6 (Left, Lateral Lower Leg) Notes TCA, Alginate, ABD, 4 layer Electronic Signature(s) Signed: 02/03/2020 11:21:31 AM By: [name], [name], [name], CWS, [name] Entered By: [name], [name], [name], CWS, Amber on 02/01/2020 11:08:31 [name] -------------------------------------------------------------------------------- Wound Assessment Details Patient Name: [name], [name]. Date of Service: 02/01/2020 10:45 AM Medical Record Number: [id number] Patient Account Number: [id number] Date of Birth/Sex: 07-Apr-1946 (73 y.o.  F) Treating [name]: [name] Primary Care Belmont Valli: [name] Other Clinician: Referring Emilly Lavey: [name] Treating Durelle Zepeda/Extender: [name], [name] in Treatment: 59 Wound Status Wound Number: 9 Primary Etiology: Venous Leg Ulcer Wound Location: Left, Lateral, Posterior Lower Leg Wound Status: Open Wounding Event: Gradually Appeared Date Acquired: 07/08/2019 [name]  Of Treatment: 29 Clustered Wound: No Wound Measurements Length: (cm) 0.6 Width: (cm) 0.5 Depth: (cm) 0.2 Area: (cm) 0.236 Volume: (cm) 0.047 % Reduction in Area: 58.2% % Reduction in Volume: 17.5% Wound Description Classification: Full Thickness Without Exposed Support Structure s Treatment Notes Wound #9 (Left, Lateral, Posterior Lower Leg) Notes TCA, Alginate, ABD, 4 layer Electronic Signature(s) Signed: 02/03/2020 11:21:31 AM By: [name], [name], [name], CWS, [name] Entered By: [name], [name], [name], CWS, Amber on 02/01/2020 11:08:31

## 2020-02-04 ENCOUNTER — Encounter: Payer: Medicare Other | Attending: Physician Assistant | Admitting: Physician Assistant

## 2020-02-04 ENCOUNTER — Other Ambulatory Visit: Payer: Self-pay

## 2020-02-04 DIAGNOSIS — L97221 Non-pressure chronic ulcer of left calf limited to breakdown of skin: Secondary | ICD-10-CM | POA: Insufficient documentation

## 2020-02-04 DIAGNOSIS — L97821 Non-pressure chronic ulcer of other part of left lower leg limited to breakdown of skin: Secondary | ICD-10-CM | POA: Diagnosis not present

## 2020-02-04 DIAGNOSIS — M1990 Unspecified osteoarthritis, unspecified site: Secondary | ICD-10-CM | POA: Diagnosis not present

## 2020-02-04 DIAGNOSIS — I10 Essential (primary) hypertension: Secondary | ICD-10-CM | POA: Insufficient documentation

## 2020-02-04 DIAGNOSIS — G629 Polyneuropathy, unspecified: Secondary | ICD-10-CM | POA: Diagnosis not present

## 2020-02-04 DIAGNOSIS — G4733 Obstructive sleep apnea (adult) (pediatric): Secondary | ICD-10-CM | POA: Diagnosis not present

## 2020-02-04 DIAGNOSIS — J45909 Unspecified asthma, uncomplicated: Secondary | ICD-10-CM | POA: Insufficient documentation

## 2020-02-04 DIAGNOSIS — L97812 Non-pressure chronic ulcer of other part of right lower leg with fat layer exposed: Secondary | ICD-10-CM | POA: Diagnosis not present

## 2020-02-04 DIAGNOSIS — I776 Arteritis, unspecified: Secondary | ICD-10-CM | POA: Diagnosis not present

## 2020-02-04 DIAGNOSIS — I82592 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: Secondary | ICD-10-CM | POA: Diagnosis not present

## 2020-02-04 DIAGNOSIS — I890 Lymphedema, not elsewhere classified: Secondary | ICD-10-CM | POA: Diagnosis not present

## 2020-02-04 DIAGNOSIS — E876 Hypokalemia: Secondary | ICD-10-CM | POA: Diagnosis not present

## 2020-02-04 DIAGNOSIS — L97211 Non-pressure chronic ulcer of right calf limited to breakdown of skin: Secondary | ICD-10-CM | POA: Diagnosis not present

## 2020-02-04 DIAGNOSIS — H04122 Dry eye syndrome of left lacrimal gland: Secondary | ICD-10-CM | POA: Insufficient documentation

## 2020-02-04 DIAGNOSIS — I872 Venous insufficiency (chronic) (peripheral): Secondary | ICD-10-CM | POA: Insufficient documentation

## 2020-02-04 DIAGNOSIS — Z7902 Long term (current) use of antithrombotics/antiplatelets: Secondary | ICD-10-CM | POA: Diagnosis not present

## 2020-02-04 DIAGNOSIS — I87332 Chronic venous hypertension (idiopathic) with ulcer and inflammation of left lower extremity: Secondary | ICD-10-CM | POA: Diagnosis not present

## 2020-02-04 NOTE — Progress Notes (Addendum)
ZIKIA, LIMING ([id number]) Visit Report for 02/04/2020 Chief Complaint Document Details Patient Name: [name], [name]. Date of Service: 02/04/2020 8:15 AM Medical Record Number: [id number] Patient Account Number: [id number] Date of Birth/Sex: 01/31/1946 (74 y.o. F) Treating RN: Army Melia Primary Care Provider: [name] Other Clinician: Referring Provider: [name] Treating Provider/Extender: Melburn Hake, Kurtis Anastasia Weeks in Treatment: 72 Information Obtained from: Patient Chief Complaint Left calf venous stasis ulceration. 11/13/16; the patient is here for a left calf recurrent ulceration which is painful and has been present for the last month 01/22/17; the patient re-presents here for recurrent difficulties with blistering and leaking fluid on her left posterior calf 12/10/18; the patient returns to clinic for a wound on the left medial calf Electronic Signature(s) Signed: 02/04/2020 8:37:14 AM By: Worthy Keeler PA-C Entered By: Worthy Keeler on 02/04/2020 08:37:14 Bitter, [name] -------------------------------------------------------------------------------- HPI Details Patient Name: BRADLEIGH, HEDIN. Date of Service: 02/04/2020 8:15 AM Medical Record Number: [id number] Patient Account Number: [id number] Date of Birth/Sex: Jun 17, 1946 (74 y.o. F) Treating RN: Army Melia Primary Care Provider: [name] Other Clinician: Referring Provider: [name] Treating Provider/Extender: STONE III, Davin Archuletta Weeks in Treatment: 40 History of Present Illness HPI Description: Pleasant 74 year old with history of chronic venous insufficiency. No diabetes or peripheral vascular disease. Left ABI 1.29. Questionable history of left lower extremity DVT. She developed a recurrent ulceration on her left lateral calf in December 2015, which she attributes to poor diet and subsequent lower extremity edema. She underwent endovenous laser ablation of her left greater saphenous  vein in 2010. She underwent laser ablation of accessory branch of left GSV in April 2016 by Dr. Kellie Simmering at [facility] She was previously wearing Unna boots, which she tolerated well. Tolerating 2 layer compression and cadexomer iodine. She returns to clinic for follow-up and is without new complaints. She denies any significant pain at this time. She reports persistent pain with pressure. No claudication or ischemic rest pain. No fever or chills. No drainage. READMISSION 11/13/16; this is a 74 year old woman who is not a diabetic. She is here for a review of a painful area on her left medial lower extremity. I note that she was seen here previously last year for wound I believe to be in the same area. At that time she had undergone previously a left greater saphenous vein ablation by Dr. Kellie Simmering and she had a ablation of the anterior accessory branch of the left greater saphenous vein in March 2016. Seeing that the wound actually closed over. In reviewing the history with her today the ulcer in this area has been recurrent. She describes a biopsy of this area in 2009 that only showed stasis physiology. 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 Do not see that she has ever had arterial studies in the left leg. She tells me she has a follow-up with Dr. Kellie Simmering in roughly 10 days In any case she developed the reopening of this area roughly a month ago. On the background of this she describes rapidly increasing edema which has responded to Lasix 40 mg and metolazone 2.5 mg as well as the patient's lymph massage. She has been told she has both venous insufficiency and lymphedema but she cannot tolerate compression stockings 11/28/16; the patient  saw Dr. Kellie Simmering recently. Per the patient he did  arterial Dopplers in the office that did not show evidence of arterial insufficiency, per the patient he stated "treat this like an ordinary venous ulcer". She also saw her dermatologist Dr. Ronnald Ramp who felt that this was more of a vascular ulcer. In general things are improving although she arrives today with increasing bilateral lower extremity edema with weeping a deeper fluid through the wound on the left medial leg compatible with some degree of lymphedema 12/04/16; the patient's wound is fully epithelialized but I don't think fully healed. We will do another week of depression with Promogran and TCA however I suspect we'll be able to discharge her next week. This is a very unusual-looking wound which was initially a figure-of-eight type wound lying on its side surrounded by petechial like hemorrhage. She has had venous ablation on this side. She apparently does not have an arterial issue per Dr. Kellie Simmering. She saw her dermatologist thought it was "vascular". Patient is definitely going to need ongoing compression and I talked about this with her today she will go to elastic therapy after she leaves here next week 12/11/16; the patient's wound is not completely closed today. She has surrounding scar tissue and in further discussion with the patient it would appear that she had ulcers in this area in 2009 for a prolonged period of time ultimately requiring a punch biopsy of this area that only showed venous insufficiency. I did not previously pickup on this part of the history from the patient. 12/18/16; the patient's wound is completely epithelialized. There is no open area here. She has significant bilateral venous insufficiency with secondary lymphedema to a mild-to-moderate degree she does not have compression stockings.. She did not say anything to me when I was in the room, she told our intake nurse that she was still having pain in this area. This isn't unusual recurrent small open area. She is going  to go to elastic therapy to obtain compression stockings. 12/25/16; the patient's wound is fully epithelialized. There is no open area here. The patient describes some continued episodic discomfort in this area medial left calf. However everything looks fine and healed here. She is been to elastic therapy and caught herself 15-20 mmHg stockings, they apparently were having trouble getting 20-30 mm stockings in her size 01/22/17; this is a patient we discharged from the clinic a month ago. She has a recurrent open wound on her medial left calf. She had 15 mm support stockings. I told her I thought she needed 20-30 mm compression stockings. She tells me that she has been ill with hospitalization secondary to asthma and is been found to have severe hypokalemia likely secondary to a combination of Lasix and metolazone. This morning she noted blistering and leaking fluid on the posterior part of her left leg. She called our intake nurse urgently and we was saw her this afternoon. She has not had any real discomfort here. I don't know that she's been wearing any stockings on this leg for at least 2-3 days. ABIs in this clinic were 1.21 on the right and 1.3 on the left. She is previously seen vascular surgery who does not think that there is a peripheral arterial issue. 01/30/17; Patient arrives with no open wound on the left leg. She has been to elastic therapy and obtained 20-79mmhg below knee stockings and she has one on the right leg today. READMISSION 02/19/18; this [name] is a now 74 year old patient we've had in  this clinic perhaps 3 times before. I had last looked at her from January 07 December 2016 with an area on the medial left leg. We discharged her on 12/25/16 however she had to be readmitted on 01/22/17 with a recurrence. I have in my notes that we discharged her on 20-30 mm stockings although she tells me she was only wearing support hose because she cannot get stockings on predominantly related  to her cervical spine surgery/issues. She has had previous ablations done by vein and vascular in [place] including a great saphenous vein ablation on the left with an anterior accessory branch ablation I think both of these were in 2016. On one of the previous visit she had a biopsy noted 2009 that was negative. She is not felt to have an arterial issue. She is not a diabetic. She does have a history of obstructive sleep apnea hypertension asthma as well as chronic venous insufficiency and lymphedema. SHATONGA, SCIULLI ([id number]) On this occasion she noted 2 dry scaly patch on her left leg. She tried to put lotion on this it didn't really help. There were 2 open areas.the patient has been seeing her primary physician from 02/05/18 through 02/14/18. She had Unna boots applied. The superior wound now on the lateral left leg has closed but she's had one wound that remains open on the lateral left leg. This is not the same spot as we dealt with in 2018. ABIs in this clinic were 1.3 bilaterally 02/26/18; patient has a small wound on the left lateral calf. Dimensions are down. She has chronic venous insufficiency and lymphedema. 03/05/18; small open area on the left lateral calf. Dimensions are down. Tightly adherent necrotic debris over the surface of the wound which was difficult to remove. Also the dressing [over collagen] stuck to the wound surface. This was removed with some difficulty as well. Change the primary dressing to Hydrofera Blue ready 03/12/18; small open area on the left lateral calf. Comes in with tightly adherent surface eschar as well as some adherent Hydrofera Blue. 03/19/18; open area on the left lateral calf. Again adherent surface eschar as well as some adherent Hydrofera Blue nonviable subcutaneous tissue. She complained of pain all week even with the reduction from 4-3 layer compression I put on last week. Also she had an increase in her ankle and calf measurements probably related to  the same thing. 03/26/18; open area on the left lateral calf. A very small open area remains here. We used silver alginate starting last week as the Hydrofera Blue seem to stick to the wound bed. In using 4-layer compression 04/02/18; the open area in the left lateral calf at some adherent slough which I removed there is no open area here. We are able to transition her into her own compression stocking. Truthfully I think this is probably his support hose. However this does not maintain skin integrity will be limited. She cannot put over the toe compression stockings on because of neck problems hand problems etc. She is allergic to the lining layer of juxta lites. We might be forced to use extremitease stocking should this fail READMIT 11/24/2018 Patient is now a 74 year old woman who is not a diabetic. She has been in this clinic on at least 3 previous occasions largely with recurrent wounds on her left leg secondary to chronic venous insufficiency with secondary lymphedema. Her situation is complicated by inability to get stockings on and an allergy to neoprene which is apparently a component and at least juxta  lites and other stockings. As a result she really has not been wearing any stockings on her legs. She tells [place] that roughly 2 or 3 weeks ago she started noticing a stinging sensation just above her ankle on the left medial aspect. She has been diagnosed with pseudogout and she wondered whether this was what she was experiencing. She tried to dress this with something she bought at the store however subsequently it pulled skin off and now she has an open wound that is not improving. She has been using Vaseline gauze with a cover bandage. She saw her primary doctor last week who put an Haematologist on her. ABIs in this clinic was 1.03 on the left 2/12; the area is on the left medial ankle. Odd-looking wound with what looks to be surface epithelialization but a multitude of small petechial  openings. This clearly not closed yet. We have been using silver alginate under 3 layer compression with TCA 2/19; the wound area did not look quite as good this week. Necrotic debris over the majority of the wound surface which required debridement. She continues to have a multitude of what looked to be small petechial openings. She reminds [place] that she had a biopsy on this initially during her first outbreak in 2015 in [place] dermatology. She expresses concern about this being a possible melanoma. 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 I assured her that this does not look anything like melanoma. Except for the petechial reaction it does look like a venous insufficiency area and she certainly has evidence of this on both sides 2/26; a difficult area on the left medial ankle. The patient clearly has chronic venous hypertension with some degree of lymphedema. The odd thing about the area is the small petechial hemorrhages. I am not really sure how to explain this. This was present last time and this is not a compression injury. We have been using Hydrofera Blue which I changed to last week 3/4; still using Hydrofera Blue. Aggressive debridement today. She does not have known arterial issues. She has seen Dr. Kellie Simmering at [name] vein and vascular and and has an ablation on the left. [Anterior accessory branch of the greater saphenous]. From what I remember they did not feel she had an arterial issue. The patient has had this area biopsied in 2009 at [facility] dermatology and by her recollection they said this was "stasis". She is also follow-up with dermatology locally who thought that this was more of a vascular issue 3/11; using Hydrofera Blue. Aggressive debridement today. She does not have an arterial issue. We are using 3 layer compression although we may need to go to 4. The patient has been in for  multiple changes to her wrap since I last saw her a week ago. She says that the area was leaking. I do not have too much more information on what was found 01/19/19 on evaluation today patient was actually being seen for a nurse visit when unfortunately she had the area on her left lateral lower extremity as well as weeping from the right lower extremity that became apparent. Therefore we did end up actually seeing her for a full visit with myself. She is having some pain at this site as well but fortunately nothing too significant at this point. No fevers, chills, nausea, or vomiting noted at this time. 3/18-Patient is back to the clinic with the left leg venous leg  ulcer, the ulcer is larger in size, has a surface that is densely adherent with fibrinous tissue, the Hydrofera Blue was used but is densely adherent and there was difficulty in removing it. The right lower extremity was also wrapped for weeping edema. Patient has a new area over the left lateral foot above the malleolus that is small and appears to have no debris with intact surrounding skin. Patient is on increased dose of Lasix also as a means to edema management 3/25; the patient has a nonhealing venous ulcer on the medial left leg and last week developed a smaller area on the lateral left calf. We have been using Hydrofera Blue with a contact layer. 4/1; no major change in these wounds areas. Left medial and more recently left lateral calf. I tried Iodoflex last week to aid in debridement she did not tolerate this. She stated her pain was terrible all week. She took the top layer of the 4 layer compression off. 4/8; the patient actually looks somewhat better in terms of her more prominent left lateral calf wound. There is some healthy looking tissue here. She is still complaining of a lot of discomfort. 4/15; patient in a lot of pain secondary to sciatica. She is on a prednisone taper prescribed by her primary physician. She has the 2  areas one on the left medial and more recently a smaller area on the left lateral calf. Both of these just above the malleoli 4/22; her back pain is better but she still states she is very uncomfortable and now feels she is intolerant to the [employer]. No real change in the wounds we have been using Sorbact. She has been previously intolerant to Iodoflex. There is not a lot of option about what we can use to debride this wound under compression that she no doubt needs. sHe states Ultram no longer works for her pain IVRY, DANESE ([id number]) 4/29; no major change in the wounds slightly increased depth. Surface on the original medial wound perhaps somewhat improved however the more recent area on the lateral left ankle is 100% covered in very adherent debris we have been using Sorbact. She tolerates 4 layer compression well and her edema control is a lot better. She has not had to come in for a nurse check 5/6; no major change in the condition of the wounds. She did consent to debridement today which was done with some difficulty. Continuing Sorbact. She did not tolerate Iodoflex. She was in for a check of her compression the day after we wrapped her last week this was adjusted but nothing much was found 5/13; no major change in the condition or area of the wounds. I was able to get a fairly aggressive debridement done on the lateral left leg wound. Even using Sorbact under compression. She came back in on Friday to have the wrap changed. She says she felt uncomfortable on the lateral aspect of her ankle. She has a long history of chronic venous insufficiency including previous ablation surgery on this side. 5/20-Patient returns for wounds on left leg with both wounds covered in slough, with the lateral leg wound larger in size, she has been in 3 layer compression and felt more comfortable, she describes pain in ankle, in leg and pins and needles in foot, and is about to try Pamelor for this 6/3;  wounds on the left lateral and left medial leg. The area medially which is the most recent of the 2 seems to have had the  largest increase in dimensions. We have been using Sorbac to try and debride the surface. She has been to see orthopedics they apparently did a plain x-ray that was indeterminant. Diagnosed her with neuropathy and they have ordered an MRI to determine if there is underlying osteomyelitis. This was not high on my thought list but I suppose it is prudent. We have advised her to make an appointment with vein and vascular in [place]. She has a history of a left greater saphenous and accessory vein ablations I wonder if there is anything else that can be done from a surgical point of view to help in these difficult refractory wounds. We have previously healed this wound on one occasion but it keeps on reopening [medial side] 6/10; deep tissue culture I did last week I think on the left medial wound showed both moderate E. coli and moderate staph aureus [MSSA]. She is going to require antibiotics and I have chosen Augmentin. We have been using Sorbact and we have made better looking wound surface on both sides but certainly no improvement in wound area. She was back in last Friday apparently for a dressing changes the wrap was hurting her outer left ankle. She has not managed to get a hold of vein and vascular in [place]. We are going to have to make her that appointment 6/17; patient is tolerating the Augmentin. She had an MRI that I think was ordered by orthopedic surgeon this did not show osteomyelitis or an abscess did suggest cellulitis. We have been using Sorbact to the lateral and medial ankles. We have been trying to arrange a follow-up appointment with vein and vascular in [place] or did her original ablations. We apparently an area sent the request to vein and vascular in [facility] 6/24; patient has completed the Augmentin. We do not yet have a vein and vascular  appointment in [place]. I am not sure what the issue is here we have asked her to call tomorrow. We are using Sorbact. Making some improvements and especially the medial wound. Both surfaces however look better medial and lateral. 7/1; the patient has been in contact with vein and vascular in [place] but has not yet received an appointment. Using Sorbact we have gradually improve the wound surface with no improvement in surface area. She is approved for Apligraf but the wound surface still is not completely viable. She has not had to come in for a dressing change 7/8; the patient has an appointment with vein and vascular on 7/31 which is a Friday afternoon. She is concerned about getting back here for [place] to dress her wounds. I think it is important to have them goal for her venous reflux/history of ablations etc. to see if anything else can be done. She apparently tested positive for 1 of the blood tests with regards to lupus and saw a rheumatologist. He has raised the issue of vasculitis again. I have had this thought in the past however the evidence seems overwhelming that this is a venous reflux etiology. If the rheumatologist tells me there is clinical and laboratory investigation is positive for lupus I will rethink this. 7/15; the patient's wound surfaces are quite a bit better. The medial area which was her original wound now has no depth although the lateral wound which was the more recent area actually appears larger. Both with viable surfaces which is indeed better. Using Sorbact. I wanted to use Apligraf on her however there is the issue of the vein and vascular appointment  on 7/31 at 2:00 in the afternoon which would not allow her to get back to be rewrapped and they would no doubt remove the graft 7/22; the patient's wound surfaces have moderate amount of debris although generally look better. The lateral one is larger with 2 small satellite areas superiorly. We are waiting for her  vein and vascular appointment on 7/31. She has been approved for Apligraf which I would like to use after th 7/29; wound surfaces have improved no debridement is required we have been using Sorbact. She sees vein and vascular on Friday with this so question of whether anything can be done to lessen the likelihood of recurrence and/or speed the healing of these areas. She is already had previous ablations. She no doubt has severe venous hypertension 8/5-Patient returns at 1 week, she was in [place] for 3 days by her podiatrist, we have been using so backed to the wound, she has increased pain in both the wounds on the left lower leg especially the more distal one on the lateral aspect 8/12-Patient returns at 1 week and she is agreeable to having debridement in both wounds on her left leg today. We have been using Sorbact, and vascular studies were reviewed at last visit 8/19; the patient arrives with her wounds fairly clean and no debridement is required. We have used Sorbact which is really done a nice job in cleaning up these very difficult wound surfaces. The patient saw Dr. Donzetta Matters of vascular surgery on 7/31. He did not feel that there was an arterial component. He felt that her treated greater saphenous vein is adequately addressed and that the small saphenous vein did not appear to be involved significantly. She was also noted to have deep venous reflux which is not treatable. Dr. Donzetta Matters mentioned the possibility of a central obstructive component leading to reflux and he offered her central venography. She wanted to discuss this or think about it. I have urged her to go ahead with this. She has had recurrent difficult wounds in these areas which do heal but after months in the clinic. If there is anything that can be done to reduce the likelihood of this I think it is worth it. 9/2 she is still working towards getting follow-up with Dr. Donzetta Matters to schedule her CT. Things are quite a bit worse  venography. I put Apligraf on 2 weeks ago on both wounds on the medial and lateral part of her left lower leg. She arrives in clinic today with 3 superficial additional wounds above the area laterally and one below the wound medially. She describes a lot of discomfort. I think these are probably wrapped injuries. Does not look like she has cellulitis. 07/20/2019 on evaluation today patient appears to be doing somewhat poorly in regard to her lower extremity ulcers. She in fact showed signs of erythema in fact we may even be dealing with an infection at this time. Unfortunately I am unsure if this is just infection or if indeed there may be some allergic reaction that occurred as a result of the Apligraf application. With that being said that would be unusual but nonetheless not impossible in this patient is one who is unfortunately allergic to quite a bit. Currently we have been using the Sorbact which seems to do as well as anything for her. I do think we may want to obtain a culture today to see if there is anything showing up there that may need to be addressed. 9/16; noted that last  week the wounds look worse in 1 week follow-up of the Apligraf. Using Sorbact as of 2 days ago. She arrives with copious amounts of drainage and new skin breakdown on the back of the left calf. The wounds arm more substantial bilaterally. There is a fair amount of swelling in the left calf no overt DVT there is edema present I think in the left greater than right thigh. She is supposed to go on 9/28 for CT MACKINZE, KOPEC. ([id number]) venography. The wounds on the medial and lateral calf are worse and she has new skin breakdown posteriorly at least new for me. This is almost developing into a circumferential wound area The Apligraf was taken off last week which I agree with things are not going in the right direction a culture was done we do not have that back yet. She is on Augmentin that she started 2 days ago 9/23;  dressing was changed by her nurses on Monday. In general there is no improvement in the wound areas although the area looks less angry than last week. She did get Augmentin for MSSA cultured on the 14th. She still appears to have too much swelling in the left leg even with 3 layer compression 9/30; the patient underwent her procedure on 9/28 by Dr. Donzetta Matters at [facility]. She was discovered to have the common iliac vein measuring 12.2 mm but at the level of L4-L5 measured 3 mm. After stenting it measured 10 mm. 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 Rouleaux flow in the common femoral and femoral vein was observed much improved after stenting. We are using silver alginate to the wounds on the medial and lateral ankle on the left. 4 layer compression 10/7; the patient had fluid swelling around her knee and 4 layer compression. At the advice of vein and vascular this was reduced to 3 layer which she is tolerating better. We have been using silver alginate under 3 layer compression since last Friday 10/14; arrives with the areas on the left ankle looking a lot better. Inflammation in the area also a lot better. She came in for a nurse check on 10/9 10/21; continued nice improvement. Slight improvements in surface area of both the medial and lateral wounds on the left. A lot of the satellite lesions in the weeping erythema around these from stasis dermatitis is resolved. We have been using silver alginate 10/28; general improvement in the entire wound areas although not a lot of change in dimensions the wound certainly looks better. There is a lot less in terms of venous inflammation. Continue silver alginate this week however look towards Hydrofera Blue next week 11/4; very adherent debris on the medial wound left wound is not as bad. We have been using silver alginate. Change to [facility] today 11/11; very adherent debris on both wound areas. She went to vein and  vascular last week and follow-up they put in [place] boot on this today. He says the [facility] was adherent. Wound is definitely not as good as last week. Especially on the left there the satellite lesions look more prominent 11/18; absolutely no better. erythema on lateral aspect with tenderness. 09/30/2019 on evaluation today patient appears to actually be doing better. Dr. Dellia Nims did put her on doxycycline last week which I do believe has helped her at this point. Fortunately there is no signs of active infection at this time. No fevers, chills, nausea, vomiting, or diarrhea. I do believe he may  want extend the doxycycline for 7 additional days just to ensure everything does completely cleared up the patient is in agreement with that plan. Otherwise she is going require some sharp debridement today 12/2; patient is completing a 2-week course of doxycycline. I gave her this empirically for inflammation as well as infection when I last saw her 2 weeks ago. All of this seems to be better. She is using silver alginate she has the area on the medial aspect of the larger area laterally and the 2 small satellite regions laterally above the major wound. 12/9; the patient's wound on the left medial and left lateral calf look really quite good. We have been using silver alginate. She saw vein and vascular in follow-up on 10/09/2019. She has had a previous left greater saphenous vein ablation by Dr. Oscar La in 2016. More recently she underwent a left common iliac vein stent by Dr. 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 The swelling is improved and certainly the wounds have improved. The patient shows [place] today area on the right medial calf there is almost no wound but leaking lymphedema. She says she start this started 3 or 4 days ago. She did not traumatize it. It is not painful. She does not wear compression on that side 12/16; the patient continues to do well laterally. Medially still requiring  debridement. The area on the right calf did not materialize to anything and is not currently open. We wrapped this last time. She has support stockings for that leg although I am not sure they are going to provide adequate compression 12/23; the lateral wound looks stable. Medially still requiring debridement for tightly adherent fibrinous debris. We've been using silver alginate. Surface area not any different 12/30; neither wound is any better with regards to surface and the area on the left lateral is larger. I been using silver alginate to the left lateral which look quite good last week and Sorbact to the left medial 11/11/2019. Lateral wound area actually looks better and somewhat smaller. Medial still requires a very aggressive debridement today. We have been using Sorbact on both wound areas 1/13; not much better still adherent debris bilaterally. I been using Sorbact. She has severe venous hypertension. Probably some degree of dermal fibrosis distally. I wonder whether tighter compression might help and I am going to try that today. We also need to work on the bioburden 1/20; using Sorbact. She has severe venous hypertension status post stent placement for pelvic vein compression. We applied gentamicin last time to see if we could reduce bioburden I had some discussion with her today about the use of pentoxifylline. This is occasionally used in this setting for wounds with refractory venous insufficiency. However this interacts with Plavix. She tells me that she was put on this after stent placement for 3 months. She will call Dr. Claretha Cooper office to discuss 1/27; we are using gentamicin under Sorbact. 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 She has a stent. Wound medially is measuring smaller this week. Laterally measuring slightly larger although she has some satellite lesions superiorly 2/3; gentamicin under Sorbact under 4-layer compression. She has severe  venous hypertension with may Thurner pathophysiology. She has a stent on Plavix. Her wounds are measuring smaller this week. More substantially laterally where there is a satellite lesion superiorly. 2/10; gentamicin under Sorbac. 4-layer compression. Patient communicated with Dr. Donzetta Matters at vein and vascular in [place]. He is okay with the patient coming off Plavix I will  therefore start her on pentoxifylline for a 1 month trial. In general her wounds look better today. I had some concerns about swelling in the left thigh however she measures 61.5 on the right and 63 on the mid thigh which does not suggest there is any difficulty. The patient is not describing any pain. 2/17; gentamicin under Sorbac 4-layer compression. She has been on pentoxifylline for 1 week and complains of loose stool. No nausea she is eating and drinking well 2/24; the patient apparently came in 2 days ago for a nurse visit when her wrap fell down. Both areas look a little worse this week macerated medially and satellite lesions laterally. Change to silver alginate today 3/3; wounds are larger today especially medially. She also has more swelling in her foot lower leg and I even noted some swelling in her posterior thigh which is tender. I wonder about the patency of her stent. Fortuitously she sees Dr. Claretha Cooper group on Friday 3/10; Mrs. Ekblad was seen by vein and vascular on 3/5. The patient underwent ultrasound. There was no evidence of thrombosis involving the IVC no evidence of thrombosis involving the right common iliac vein there is no evidence of thrombosis involving the right external iliac vein the left external Mcglone, Almeta J. ([id number]) vein is also patent. The right common iliac vein stent appears patent bilateral common femoral veins are compressible and appear patent. I was concerned about the left common iliac stent however it looks like this is functional. She has some edema in the posterior thigh that was  tender she still has that this week. I also note they had trouble finding the pulses in her left foot and booked her for an ABI baseline in 4 weeks. She will follow up in 6 months for repeat IVC duplex. The patient stopped the pentoxifylline because of diarrhea. It does not look like that was being effective in any case. I have advised her to go back on her aspirin 81 mg tablet, vascular it also suggested this 3/17; comes in today with her wound surfaces a lot better. The excoriations from last week considerably better probably secondary to the TCA. We have been using silver alginate 3/24; comes in today with smaller wounds both medially and laterally. Both required debridement. There are 2 small satellite areas superiorly laterally. She also has a very odd bandlike area in the mid calf almost looking like there was a weakness in the wrap in a localized area. I would write this off as being this however anteriorly she has a small raised ballotable area that is very tender almost reminiscent of an abscess but there was no obvious purulent surface to it. 02/04/20 upon evaluation today patient appears to be doing fairly well in regard to her wounds today. Fortunately there is no signs of active infection at this time. No fevers, chills, nausea, vomiting, or diarrhea. She has been tolerating the dressing changes without complication. Fortunately I feel like she is showing signs of improvement although has been sometime since have seen her. Nonetheless the area of concern that Dr. Dellia Nims had last week where she had possibly an area of the wrap that was we can allow the leg to bulge appears to be doing significantly better today there is no signs of anything worsening. Electronic Signature(s) Signed: 02/04/2020 9:29:44 AM By: Worthy Keeler PA-C Entered By: Worthy Keeler on 02/04/2020 09:29:43 VYLETTE, WORK  ([id number]) -------------------------------------------------------------------------------- Physical Exam Details Patient Name: ZENOVIA, PIGGOTT. Date of Service: 02/04/2020 8:15  AM Medical Record Number: [id number] Patient Account Number: [id number] Date of Birth/Sex: [birth year] (75 y.o. F) Treating RN: Army Melia Primary Care Provider: [name] Other Clinician: Referring Provider: [name] Treating Provider/Extender: STONE III, Finleigh Cheong Weeks in Treatment: 45 Constitutional [name] and well-hydrated in no acute distress. Respiratory normal breathing without difficulty. Psychiatric this patient is able to make decisions and demonstrates good insight into disease process. Alert and Oriented x 3. pleasant and cooperative. Notes Patient's wound bed currently showed signs of good granulation at this time. There did not even appear to be a need for sharp debridement today at either wound location left lower extremity. She did have new areas on the right lower extremity which I think is likely related to venous flow as well although the patient is unsure of that. With that being said there is no signs of active infection at this time. Electronic Signature(s) Signed: 02/04/2020 9:30:09 AM By: Worthy Keeler PA-C Entered By: Worthy Keeler on 02/04/2020 09:30:09 CHYNIA, BRAZELTON ([id number]) -------------------------------------------------------------------------------- Physician Orders Details Patient Name: ALAYCIA, KOKER. Date of Service: 02/04/2020 8:15 AM Medical Record Number: [id number] Patient Account Number: [id number] Date of Birth/Sex: [birth year] (74 y.o. 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 Wound #11 Right,Medial Lower Leg o Topical Lidocaine 4% cream applied to wound bed prior to debridement (In Clinic Only). Wound #5 Left,Medial Lower Leg o Topical Lidocaine 4% cream applied to wound bed prior to debridement (In Clinic Only). Wound #6 Left,Lateral Lower Leg o Topical Lidocaine 4% cream applied to wound bed prior to debridement (In Clinic Only). Wound #9 Left,Lateral,Posterior Lower Leg o Topical Lidocaine 4% cream applied to wound bed prior to debridement (In Clinic Only). Skin Barriers/Peri-Wound Care Wound #10 Left,Proximal,Lateral Lower Leg o Triamcinolone Acetonide Ointment (TCA) - peri-wound Wound #11 Right,Medial Lower Leg o Triamcinolone Acetonide Ointment (TCA) - peri-wound Wound #5 Left,Medial Lower Leg o Triamcinolone Acetonide Ointment (TCA) - peri-wound Wound #6 Left,Lateral Lower Leg o Triamcinolone Acetonide Ointment (TCA) - peri-wound Wound #9 Left,Lateral,Posterior Lower Leg o Triamcinolone Acetonide Ointment (TCA) - peri-wound Primary Wound Dressing Wound #10 Left,Proximal,Lateral Lower Leg o Alginate Wound #11 Right,Medial Lower Leg o Alginate Wound #5 Left,Medial Lower Leg o Alginate Wound #6 Left,Lateral Lower Leg o Alginate Wound #9 Left,Lateral,Posterior Lower Leg o Alginate JAHZEL, SCANLON ([id number]) Secondary Dressing Wound #10 Left,Proximal,Lateral Lower Leg o ABD pad Wound #11 Right,Medial Lower Leg o ABD pad Wound #5 Left,Medial Lower Leg o ABD pad Wound #6  Left,Lateral Lower Leg o ABD pad Wound #9 Left,Lateral,Posterior Lower Leg o ABD pad Dressing Change Frequency Wound #10 Left,Proximal,Lateral Lower Leg o Change dressing every week o Other: - Nurse visit Friday and Monday if needed. Wound #11 Right,Medial Lower Leg o Change dressing every week o Other: - Nurse visit Friday and Monday if needed. Wound #5 Left,Medial Lower Leg o Change dressing every week o Other: - Nurse visit Friday and Monday if needed.  Wound #6 Left,Lateral Lower Leg o Change dressing every week o Other: - Nurse visit Friday and Monday if needed. Wound #9 Left,Lateral,Posterior Lower Leg o Change dressing every week o Other: - Nurse visit Friday and Monday if needed. Follow-up Appointments Wound #10 Left,Proximal,Lateral Lower Leg o Return Appointment in 1 week. o Nurse Visit as needed Wound #11 Right,Medial Lower Leg o Return Appointment in 1 week. o Nurse Visit as needed Wound #5 Left,Medial Lower Leg o Return Appointment in 1 week. o Nurse Visit as needed Wound #6 Left,Lateral Lower Leg o Return Appointment in 1 week. o Nurse Visit as needed Wound #9 Left,Lateral,Posterior Lower Leg o Return Appointment in 1 week. o Nurse Visit as needed Edema Control Wound #10 Left,Proximal,Lateral Lower Leg o 4 Layer Compression System - Bilateral o Elevate legs to the level of the heart and pump ankles as often as possible Wound #11 Right,Medial Lower Leg o 4 Layer Compression System - Bilateral o Elevate legs to the level of the heart and pump ankles as often as possible YISELA, HAMMERSTROM. ([id number]) Wound #5 Left,Medial Lower Leg o 4 Layer Compression System - Bilateral o Elevate legs to the level of the heart and pump ankles as often as possible Wound #6 Left,Lateral Lower Leg o 4 Layer Compression System - Bilateral o Elevate legs to the level of the heart and pump ankles as often as  possible Wound #9 Left,Lateral,Posterior Lower Leg o 4 Layer Compression System - Bilateral o Elevate legs to the level of the heart and pump ankles as often as possible Electronic Signature(s) Signed: 02/04/2020 3:58:38 PM By: Army Melia Signed: 02/04/2020 5:29:46 PM By: Worthy Keeler PA-C Entered By: Army Melia on 02/04/2020 09:13:29 [name] -------------------------------------------------------------------------------- Problem List Details Patient Name: SAFARI, FAUSEY. Date of Service: 02/04/2020 8:15 AM Medical Record Number: [id number] Patient Account Number: [id number] Date of Birth/Sex: 1946/07/03 (74 y.o. F) Treating RN: Army Melia Primary Care Provider: [name] Other Clinician: Referring Provider: [name] Treating Provider/Extender: Melburn Hake, Lundy Cozart Weeks in Treatment: 60 Active Problems ICD-10 Evaluated Encounter Code Description Active Date Today Diagnosis L97.221 Non-pressure chronic ulcer of left calf limited to breakdown of skin 01/07/2019 No Yes I87.332 Chronic venous hypertension (idiopathic) with ulcer and inflammation of left 12/09/2019 No Yes lower extremity I89.0 Lymphedema, not elsewhere classified 12/10/2018 No Yes I82.592 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 lower 12/09/2019 No Yes extremity Inactive Problems ICD-10 Code Description Active Date Inactive Date L97.818 Non-pressure chronic ulcer of other part of right lower leg with other specified 10/14/2019 10/14/2019 severity Resolved Problems Electronic Signature(s) Signed: 02/04/2020 8:37:06 AM By: Worthy Keeler PA-C Entered By: Worthy Keeler on 02/04/2020 08:37:06 Snethen, [name] -------------------------------------------------------------------------------- Progress Note Details Patient Name: GWENDY, REDHEAD. Date of Service: 02/04/2020 8:15 AM Medical Record Number: [id number] Patient Account Number:  [id number] Date of Birth/Sex: 1946-04-01 (74 y.o. F) Treating RN: Army Melia Primary Care Provider: [name] Other Clinician: Referring Provider: [name] Treating Provider/Extender: Melburn Hake, Kynsleigh Westendorf Weeks in Treatment: 54 Subjective Chief Complaint Information obtained from Patient Left calf venous stasis ulceration. 11/13/16; the patient is here for a left calf recurrent ulceration which is painful and has been present for the last month 01/22/17; the patient re-presents here for recurrent difficulties with blistering and leaking fluid on her left posterior calf 12/10/18; the patient returns to clinic for a wound on the left medial calf History of Present Illness (HPI) Pleasant 74 year old with history  of chronic venous insufficiency. No diabetes or peripheral vascular disease. Left ABI 1.29. Questionable history of left lower extremity DVT. She developed a recurrent ulceration on her left lateral calf in December 2015, which she attributes to poor diet and subsequent lower extremity edema. She underwent endovenous laser ablation of her left greater saphenous vein in 2010. She underwent laser ablation of accessory branch of left GSV in April 2016 by Dr. Kellie Simmering at [facility]. She was previously wearing [place] boots, which she tolerated well. Tolerating 2 layer compression and cadexomer iodine. She returns to clinic for follow-up and is without new complaints. She denies any significant pain at this time. She reports persistent pain with pressure. No claudication or ischemic rest pain. No fever or chills. No drainage. READMISSION 11/13/16; this is a 74 year old woman who is not a diabetic. She is here for a review of a painful area on her left medial lower extremity. I note that she was seen here previously last year for wound I believe to be in the same area. At that time she had undergone previously a left greater saphenous vein ablation by Dr. Kellie Simmering and she had a ablation of the  anterior accessory branch of the left greater saphenous vein in March 2016. Seeing that the wound actually closed over. In reviewing the history with her today the ulcer in this area has been recurrent. She describes a biopsy of this area in 2009 that only showed stasis physiology. 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 Do not see that she has ever had arterial studies in the left leg. She tells me she has a follow-up with Dr. Kellie Simmering in roughly 10 days In any case she developed the reopening of this area roughly a month ago. On the background of this she describes rapidly increasing edema which has responded to Lasix 40 mg and metolazone 2.5 mg as well as the patient's lymph massage. She has been told she has both venous insufficiency and lymphedema but she cannot tolerate compression stockings 11/28/16; the patient saw Dr. Kellie Simmering recently. Per the patient he did arterial Dopplers in the office that did not show evidence of arterial insufficiency, per the patient he stated "treat this like an ordinary venous ulcer". She also saw her dermatologist Dr. Ronnald Ramp who felt that this was more of a vascular ulcer. In general things are improving although she arrives today with increasing bilateral lower extremity edema with weeping a deeper fluid through the wound on the left medial leg compatible with some degree of lymphedema 12/04/16; the patient's wound is fully epithelialized but I don't think fully healed. We will do another week of depression with Promogran and TCA however I suspect we'll be able to discharge her next week. This is a very unusual-looking wound which was initially a figure-of-eight type wound lying on its side surrounded by petechial like hemorrhage. She has had venous  ablation on this side. She apparently does not have an arterial issue per Dr. Kellie Simmering. She saw her dermatologist thought it was "vascular". Patient is definitely going to need ongoing compression and I talked about this with her today she will go to elastic therapy after she leaves here next week 12/11/16; the patient's wound is not completely closed today. She has surrounding scar tissue and  in further discussion with the patient it would appear that she had ulcers in this area in 2009 for a prolonged period of time ultimately requiring a punch biopsy of this area that only showed venous insufficiency. I did not previously pickup on this part of the history from the patient. 12/18/16; the patient's wound is completely epithelialized. There is no open area here. She has significant bilateral venous insufficiency with secondary lymphedema to a mild-to-moderate degree she does not have compression stockings.. She did not say anything to me when I was in the room, she told our intake nurse that she was still having pain in this area. This isn't unusual recurrent small open area. She is going to go to elastic therapy to obtain compression stockings. 12/25/16; the patient's wound is fully epithelialized. There is no open area here. The patient describes some continued episodic discomfort in this area medial left calf. However everything looks fine and healed here. She is been to elastic therapy and caught herself 15-20 mmHg stockings, they apparently were having trouble getting 20-30 mm stockings in her size 01/22/17; this is a patient we discharged from the clinic a month ago. She has a recurrent open wound on her medial left calf. She had 15 mm support stockings. I told her I thought she needed 20-30 mm compression stockings. She tells me that she has been ill with hospitalization secondary to asthma and is been found to have severe hypokalemia likely secondary to a combination of Lasix and metolazone. This  morning she noted blistering and leaking fluid on the posterior part of her left leg. She called our intake nurse urgently and we was saw her this afternoon. She has not had any real discomfort here. I don't know that she's been wearing any stockings on this leg for at least 2-3 days. ABIs in this clinic were 1.21 on the right and 1.3 on the left. She is previously seen vascular surgery who does not think that there is a peripheral arterial issue. 01/30/17; Patient arrives with no open wound on the left leg. She has been to elastic therapy and obtained 20-68mmhg below knee stockings and she has one on the right leg today. MILLENA, GOSSAGE ([id number]) 02/19/18; this Deblanc is a now 74 year old patient we've had in this clinic perhaps 3 times before. I had last looked at her from January 07 December 2016 with an area on the medial left leg. We discharged her on 12/25/16 however she had to be readmitted on 01/22/17 with a recurrence. I have in my notes that we discharged her on 20-30 mm stockings although she tells me she was only wearing support hose because she cannot get stockings on predominantly related to her cervical spine surgery/issues. She has had previous ablations done by vein and vascular in [place] including a great saphenous vein ablation on the left with an anterior accessory branch ablation I think both of these were in 2016. On one of the previous visit she had a biopsy noted 2009 that was negative. She is not felt to have an arterial issue. She is not a diabetic. She does have a history of obstructive sleep apnea hypertension asthma as well as chronic venous insufficiency and lymphedema. On this occasion she noted 2 dry scaly patch on her left leg. She tried to put lotion on this it didn't really help. There were 2 open areas.the patient has been seeing her primary physician from 02/05/18 through 02/14/18. She had Unna boots applied. The superior wound now  on the lateral left  leg has closed but she's had one wound that remains open on the lateral left leg. This is not the same spot as we dealt with in 2018. ABIs in this clinic were 1.3 bilaterally 02/26/18; patient has a small wound on the left lateral calf. Dimensions are down. She has chronic venous insufficiency and lymphedema. 03/05/18; small open area on the left lateral calf. Dimensions are down. Tightly adherent necrotic debris over the surface of the wound which was difficult to remove. Also the dressing [over collagen] stuck to the wound surface. This was removed with some difficulty as well. Change the primary dressing to Hydrofera Blue ready 03/12/18; small open area on the left lateral calf. Comes in with tightly adherent surface eschar as well as some adherent Hydrofera Blue. 03/19/18; open area on the left lateral calf. Again adherent surface eschar as well as some adherent Hydrofera Blue nonviable subcutaneous tissue. She complained of pain all week even with the reduction from 4-3 layer compression I put on last week. Also she had an increase in her ankle and calf measurements probably related to the same thing. 03/26/18; open area on the left lateral calf. A very small open area remains here. We used silver alginate starting last week as the Hydrofera Blue seem to stick to the wound bed. In using 4-layer compression 04/02/18; the open area in the left lateral calf at some adherent slough which I removed there is no open area here. We are able to transition her into her own compression stocking. Truthfully I think this is probably his support hose. However this does not maintain skin integrity will be limited. She cannot put over the toe compression stockings on because of neck problems hand problems etc. She is allergic to the lining layer of juxta lites. We might be forced to use extremitease stocking should this fail READMIT 11/24/2018 Patient is now a 74 year old woman who is not a diabetic. She has been in  this clinic on at least 3 previous occasions largely with recurrent wounds on her left leg secondary to chronic venous insufficiency with secondary lymphedema. Her situation is complicated by inability to get stockings on and an allergy to neoprene which is apparently a component and at least juxta lites and other stockings. As a result she really has not been wearing any stockings on her legs. She tells [place] that roughly 2 or 3 weeks ago she started noticing a stinging sensation just above her ankle on the left medial aspect. She has been diagnosed with pseudogout and she wondered whether this was what she was experiencing. She tried to dress this with something she bought at the store however subsequently it pulled skin off and now she has an open wound that is not improving. She has been using Vaseline gauze with a cover bandage. She saw her primary doctor last week who put an Haematologist on her. ABIs in this clinic was 1.03 on the left 2/12; the area is on the left medial ankle. Odd-looking wound with what looks to be surface epithelialization but a multitude of small petechial openings. This clearly not closed yet. We have been using silver alginate under 3 layer compression with TCA 2/19; the wound area did not look quite as good this week. Necrotic debris over the majority of the wound surface which required debridement. She continues to have a multitude of what looked to be small petechial openings. She reminds [place] that she had a biopsy on this initially during  her first outbreak in 2015 in [place] dermat[facility]. She expresses concern about this being a possible melanoma. 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 I assured her that this does not look anything like melanoma. Except for the petechial reaction it does look like a venous insufficiency area and she certainly has evidence of this on both  sides 2/26; a difficult area on the left medial ankle. The patient clearly has chronic venous hypertension with some degree of lymphedema. The odd thing about the area is the small petechial hemorrhages. I am not really sure how to explain this. This was present last time and this is not a compression injury. We have been using Hydrofera Blue which I changed to last week 3/4; still using Hydrofera Blue. Aggressive debridement today. She does not have known arterial issues. She has seen Dr. Kellie Simmering at [facility] vein and vascular and and has an ablation on the left. [Anterior accessory branch of the greater saphenous]. From what I remember they did not feel she had an arterial issue. The patient has had this area biopsied in 2009 at [facility] dermatology and by her recollection they said this was "stasis". She is also follow-up with dermatology locally who thought that this was more of a vascular issue 3/11; using Hydrofera Blue. Aggressive debridement today. She does not have an arterial issue. We are using 3 layer compression although we may need to go to 4. The patient has been in for multiple changes to her wrap since I last saw her a week ago. She says that the area was leaking. I do not have too much more information on what was found 01/19/19 on evaluation today patient was actually being seen for a nurse visit when unfortunately she had the area on her left lateral lower extremity as well as weeping from the right lower extremity that became apparent. Therefore we did end up actually seeing her for a full visit with myself. She is having some pain at this site as well but fortunately nothing too significant at this point. No fevers, chills, nausea, or vomiting noted at this time. 3/18-Patient is back to the clinic with the left leg venous leg ulcer, the ulcer is larger in size, has a surface that is densely adherent with fibrinous tissue, the Hydrofera Blue was used but is densely adherent and  there was difficulty in removing it. The right lower extremity was also wrapped for weeping edema. Patient has a new area over the left lateral foot above the malleolus that is small and appears to have no debris with intact surrounding skin. Patient is on increased dose of Lasix also as a means to edema management 3/25; the patient has a nonhealing venous ulcer on the medial left leg and last week developed a smaller area on the lateral left calf. We have been using Hydrofera Blue with a contact layer. 4/1; no major change in these wounds areas. Left medial and more recently left lateral calf. I tried Iodoflex last week to aid in debridement she did not tolerate this. She stated her pain was terrible all week. She took the top layer of the 4 layer compression off. BRIEL, SHANKMAN ([id number]) 4/8; the patient actually looks somewhat better in terms of her more prominent left lateral calf wound. There is some healthy looking tissue here. She is still complaining of a lot of discomfort. 4/15; patient in a lot of pain secondary to  sciatica. She is on a prednisone taper prescribed by her primary physician. She has the 2 areas one on the left medial and more recently a smaller area on the left lateral calf. Both of these just above the malleoli 4/22; her back pain is better but she still states she is very uncomfortable and now feels she is intolerant to the [employer]. No real change in the wounds we have been using Sorbact. She has been previously intolerant to Iodoflex. There is not a lot of option about what we can use to debride this wound under compression that she no doubt needs. sHe states Ultram no longer works for her pain 4/29; no major change in the wounds slightly increased depth. Surface on the original medial wound perhaps somewhat improved however the more recent area on the lateral left ankle is 100% covered in very adherent debris we have been using Sorbact. She tolerates 4 layer  compression well and her edema control is a lot better. She has not had to come in for a nurse check 5/6; no major change in the condition of the wounds. She did consent to debridement today which was done with some difficulty. Continuing Sorbact. She did not tolerate Iodoflex. She was in for a check of her compression the day after we wrapped her last week this was adjusted but nothing much was found 5/13; no major change in the condition or area of the wounds. I was able to get a fairly aggressive debridement done on the lateral left leg wound. Even using Sorbact under compression. She came back in on Friday to have the wrap changed. She says she felt uncomfortable on the lateral aspect of her ankle. She has a long history of chronic venous insufficiency including previous ablation surgery on this side. 5/20-Patient returns for wounds on left leg with both wounds covered in slough, with the lateral leg wound larger in size, she has been in 3 layer compression and felt more comfortable, she describes pain in ankle, in leg and pins and needles in foot, and is about to try Pamelor for this 6/3; wounds on the left lateral and left medial leg. The area medially which is the most recent of the 2 seems to have had the largest increase in dimensions. We have been using Sorbac to try and debride the surface. She has been to see orthopedics they apparently did a plain x-ray that was indeterminant. Diagnosed her with neuropathy and they have ordered an MRI to determine if there is underlying osteomyelitis. This was not high on my thought list but I suppose it is prudent. We have advised her to make an appointment with vein and vascular in [place]. She has a history of a left greater saphenous and accessory vein ablations I wonder if there is anything else that can be done from a surgical point of view to help in these difficult refractory wounds. We have previously healed this wound on one occasion but it  keeps on reopening [medial side] 6/10; deep tissue culture I did last week I think on the left medial wound showed both moderate E. coli and moderate staph aureus [MSSA]. She is going to require antibiotics and I have chosen Augmentin. We have been using Sorbact and we have made better looking wound surface on both sides but certainly no improvement in wound area. She was back in last Friday apparently for a dressing changes the wrap was hurting her outer left ankle. She has not managed to get a  hold of vein and vascular in [place]. We are going to have to make her that appointment 6/17; patient is tolerating the Augmentin. She had an MRI that I think was ordered by orthopedic surgeon this did not show osteomyelitis or an abscess did suggest cellulitis. We have been using Sorbact to the lateral and medial ankles. We have been trying to arrange a follow-up appointment with vein and vascular in [place] or did her original ablations. We apparently an area sent the request to vein and vascular in [facility] 6/24; patient has completed the Augmentin. We do not yet have a vein and vascular appointment in [place]. I am not sure what the issue is here we have asked her to call tomorrow. We are using Sorbact. Making some improvements and especially the medial wound. Both surfaces however look better medial and lateral. 7/1; the patient has been in contact with vein and vascular in [place] but has not yet received an appointment. Using Sorbact we have gradually improve the wound surface with no improvement in surface area. She is approved for Apligraf but the wound surface still is not completely viable. She has not had to come in for a dressing change 7/8; the patient has an appointment with vein and vascular on 7/31 which is a Friday afternoon. She is concerned about getting back here for [place] to dress her wounds. I think it is important to have them goal for her venous reflux/history of ablations  etc. to see if anything else can be done. She apparently tested positive for 1 of the blood tests with regards to lupus and saw a rheumatologist. He has raised the issue of vasculitis again. I have had this thought in the past however the evidence seems overwhelming that this is a venous reflux etiology. If the rheumatologist tells me there is clinical and laboratory investigation is positive for lupus I will rethink this. 7/15; the patient's wound surfaces are quite a bit better. The medial area which was her original wound now has no depth although the lateral wound which was the more recent area actually appears larger. Both with viable surfaces which is indeed better. Using Sorbact. I wanted to use Apligraf on her however there is the issue of the vein and vascular appointment on 7/31 at 2:00 in the afternoon which would not allow her to get back to be rewrapped and they would no doubt remove the graft 7/22; the patient's wound surfaces have moderate amount of debris although generally look better. The lateral one is larger with 2 small satellite areas superiorly. We are waiting for her vein and vascular appointment on 7/31. She has been approved for Apligraf which I would like to use after th 7/29; wound surfaces have improved no debridement is required we have been using Sorbact. She sees vein and vascular on Friday with this so question of whether anything can be done to lessen the likelihood of recurrence and/or speed the healing of these areas. She is already had previous ablations. She no doubt has severe venous hypertension 8/5-Patient returns at 1 week, she was in [place] for 3 days by her podiatrist, we have been using so backed to the wound, she has increased pain in both the wounds on the left lower leg especially the more distal one on the lateral aspect 8/12-Patient returns at 1 week and she is agreeable to having debridement in both wounds on her left leg today. We have been using  Sorbact, and vascular studies were reviewed at last  visit 8/19; the patient arrives with her wounds fairly clean and no debridement is required. We have used Sorbact which is really done a nice job in cleaning up these very difficult wound surfaces. The patient saw Dr. Donzetta Matters of vascular surgery on 7/31. He did not feel that there was an arterial component. He felt that her treated greater saphenous vein is adequately addressed and that the small saphenous vein did not appear to be involved significantly. She was also noted to have deep venous reflux which is not treatable. Dr. Donzetta Matters mentioned the possibility of a central obstructive component leading to reflux and he offered her central venography. She wanted to discuss this or think about it. I have urged her to go ahead with this. She has had recurrent difficult wounds in these areas which do heal but after months in the clinic. If there is anything that can be done to reduce the likelihood of this I think it is worth it. 9/2 she is still working towards getting follow-up with Dr. Donzetta Matters to schedule her CT. Things are quite a bit worse venography. I put Apligraf on 2 weeks ago on both wounds on the medial and lateral part of her left lower leg. She arrives in clinic today with 3 superficial additional wounds above the area laterally and one below the wound medially. She describes a lot of discomfort. I think these are probably wrapped injuries. Does not look like she has cellulitis. 07/20/2019 on evaluation today patient appears to be doing somewhat poorly in regard to her lower extremity ulcers. She in fact showed signs of erythema in fact we may even be dealing with an infection at this time. Unfortunately I am unsure if this is just infection or if indeed there may be some LASHONA, HATTABAUGH. ([id number]) allergic reaction that occurred as a result of the Apligraf application. With that being said that would be unusual but nonetheless not impossible in  this patient is one who is unfortunately allergic to quite a bit. Currently we have been using the Sorbact which seems to do as well as anything for her. I do think we may want to obtain a culture today to see if there is anything showing up there that may need to be addressed. 9/16; noted that last week the wounds look worse in 1 week follow-up of the Apligraf. Using Sorbact as of 2 days ago. She arrives with copious amounts of drainage and new skin breakdown on the back of the left calf. The wounds arm more substantial bilaterally. There is a fair amount of swelling in the left calf no overt DVT there is edema present I think in the left greater than right thigh. She is supposed to go on 9/28 for CT venography. The wounds on the medial and lateral calf are worse and she has new skin breakdown posteriorly at least new for me. This is almost developing into a circumferential wound area The Apligraf was taken off last week which I agree with things are not going in the right direction a culture was done we do not have that back yet. She is on Augmentin that she started 2 days ago 9/23; dressing was changed by her nurses on Monday. In general there is no improvement in the wound areas although the area looks less angry than last week. She did get Augmentin for MSSA cultured on the 14th. She still appears to have too much swelling in the left leg even with 3 layer compression 9/30; the patient  underwent her procedure on 9/28 by Dr. Donzetta Matters at vascular and vein specialist. She was discovered to have the common iliac vein measuring 12.2 mm but at the level of L4-L5 measured 3 mm. After stenting it measured 10 mm. 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 Rouleaux flow in the common femoral and femoral vein was observed much improved after stenting. We are using silver alginate to the wounds on the medial and lateral ankle on the left. 4 layer compression 10/7; the patient had fluid swelling  around her knee and 4 layer compression. At the advice of vein and vascular this was reduced to 3 layer which she is tolerating better. We have been using silver alginate under 3 layer compression since last Friday 10/14; arrives with the areas on the left ankle looking a lot better. Inflammation in the area also a lot better. She came in for a nurse check on 10/9 10/21; continued nice improvement. Slight improvements in surface area of both the medial and lateral wounds on the left. A lot of the satellite lesions in the weeping erythema around these from stasis dermatitis is resolved. We have been using silver alginate 10/28; general improvement in the entire wound areas although not a lot of change in dimensions the wound certainly looks better. There is a lot less in terms of venous inflammation. Continue silver alginate this week however look towards Hydrofera Blue next week 11/4; very adherent debris on the medial wound left wound is not as bad. We have been using silver alginate. Change to [facility] today 11/11; very adherent debris on both wound areas. She went to vein and vascular last week and follow-up they put in [place] boot on this today. He says the [facility] was adherent. Wound is definitely not as good as last week. Especially on the left there the satellite lesions look more prominent 11/18; absolutely no better. erythema on lateral aspect with tenderness. 09/30/2019 on evaluation today patient appears to actually be doing better. Dr. Dellia Nims did put her on doxycycline last week which I do believe has helped her at this point. Fortunately there is no signs of active infection at this time. No fevers, chills, nausea, vomiting, or diarrhea. I do believe he may want extend the doxycycline for 7 additional days just to ensure everything does completely cleared up the patient is in agreement with that plan. Otherwise she is going require some sharp debridement today 12/2; patient is  completing a 2-week course of doxycycline. I gave her this empirically for inflammation as well as infection when I last saw her 2 weeks ago. All of this seems to be better. She is using silver alginate she has the area on the medial aspect of the larger area laterally and the 2 small satellite regions laterally above the major wound. 12/9; the patient's wound on the left medial and left lateral calf look really quite good. We have been using silver alginate. She saw vein and vascular in follow-up on 10/09/2019. She has had a previous left greater saphenous vein ablation by Dr. Oscar La in 2016. More recently she underwent a left common iliac vein stent by Dr. 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 The swelling is improved and certainly the wounds have improved. The patient shows [place] today area on the right medial calf there is almost no wound but leaking lymphedema. She says she start this started 3 or 4 days ago. She did not traumatize it. It is not painful.  She does not wear compression on that side 12/16; the patient continues to do well laterally. Medially still requiring debridement. The area on the right calf did not materialize to anything and is not currently open. We wrapped this last time. She has support stockings for that leg although I am not sure they are going to provide adequate compression 12/23; the lateral wound looks stable. Medially still requiring debridement for tightly adherent fibrinous debris. We've been using silver alginate. Surface area not any different 12/30; neither wound is any better with regards to surface and the area on the left lateral is larger. I been using silver alginate to the left lateral which look quite good last week and Sorbact to the left medial 11/11/2019. Lateral wound area actually looks better and somewhat smaller. Medial still requires a very aggressive debridement today. We have been using Sorbact on both wound areas 1/13; not much better  still adherent debris bilaterally. I been using Sorbact. She has severe venous hypertension. Probably some degree of dermal fibrosis distally. I wonder whether tighter compression might help and I am going to try that today. We also need to work on the bioburden 1/20; using Sorbact. She has severe venous hypertension status post stent placement for pelvic vein compression. We applied gentamicin last time to see if we could reduce bioburden I had some discussion with her today about the use of pentoxifylline. This is occasionally used in this setting for wounds with refractory venous insufficiency. However this interacts with Plavix. She tells me that she was put on this after stent placement for 3 months. She will call Dr. Claretha Cooper office to discuss 1/27; we are using gentamicin under Sorbact. 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 She has a stent. Wound medially is measuring smaller this week. Laterally measuring slightly larger although she has some satellite lesions superiorly 2/3; gentamicin under Sorbact under 4-layer compression. 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 She has a stent on Plavix. Her wounds are measuring smaller this week. More substantially laterally where there is a satellite lesion superiorly. 2/10; gentamicin under Sorbac. 4-layer compression. Patient communicated with Dr. Donzetta Matters at [facility] in [place]. He is okay with the patient coming off Plavix I will therefore start her on pentoxifylline for a 1 month trial. In general her wounds look better today. I had some concerns about swelling in the left thigh however she measures 61.5 on the right and 63 on the mid thigh which does not suggest there is any difficulty. The patient is not describing any pain. 2/17; gentamicin under Sorbac 4-layer compression. She has been on pentoxifylline for 1 week and complains of loose stool. No nausea she is eating ERICA, USMAN. ([id number]) and drinking well 2/24; the patient apparently came in 2 days ago for a nurse visit when her wrap fell down. Both areas look a little worse this week macerated medially and satellite lesions laterally. Change to silver alginate today 3/3; wounds are larger today especially medially. She also has more swelling in her foot lower leg and I even noted some swelling in her posterior thigh which is tender. I wonder about the patency of her stent. Fortuitously she sees Dr. Claretha Cooper group on Friday 3/10; Mrs. Shawler was seen by [facility] on 3/5. The patient underwent ultrasound. There was no evidence of thrombosis involving the IVC no evidence of thrombosis involving the right common iliac vein there is no evidence of thrombosis involving the right external  iliac vein the left external vein is also patent. The right common iliac vein stent appears patent bilateral common femoral veins are compressible and appear patent. I was concerned about the left common iliac stent however it looks like this is functional. She has some edema in the posterior thigh that was tender she still has that this week. I also note they had trouble finding the pulses in her left foot and booked her for an ABI baseline in 4 weeks. She will follow up in 6 months for repeat IVC duplex. The patient stopped the pentoxifylline because of diarrhea. It does not look like that was being effective in any case. I have advised her to go back on her aspirin 81 mg tablet, vascular it also suggested this 3/17; comes in today with her wound surfaces a lot better. The excoriations from last week considerably better probably secondary to the TCA. We have been using silver alginate 3/24; comes in today with smaller wounds both medially and laterally. Both required debridement. There are 2 small satellite areas superiorly laterally. She also has a very odd bandlike area in the mid calf almost looking like there was a  weakness in the wrap in a localized area. I would write this off as being this however anteriorly she has a small raised ballotable area that is very tender almost reminiscent of an abscess but there was no obvious purulent surface to it. 02/04/20 upon evaluation today patient appears to be doing fairly well in regard to her wounds today. Fortunately there is no signs of active infection at this time. No fevers, chills, nausea, vomiting, or diarrhea. She has been tolerating the dressing changes without complication. Fortunately I feel like she is showing signs of improvement although has been sometime since have seen her. Nonetheless the area of concern that Dr. Dellia Nims had last week where she had possibly an area of the wrap that was we can allow the leg to bulge appears to be doing significantly better today there is no signs of anything worsening. Objective Constitutional [name] and well-hydrated in no acute distress. Vitals Time Taken: 8:34 AM, Height: 63 in, Weight: 224.7 lbs, BMI: 39.8, Temperature: 98.2 F, Pulse: 87 bpm, Respiratory Rate: 16 breaths/min, Blood Pressure: 149/68 mmHg. Respiratory normal breathing without difficulty. Psychiatric this patient is able to make decisions and demonstrates good insight into disease process. Alert and Oriented x 3. pleasant and cooperative. General Notes: Patient's wound bed currently showed signs of good granulation at this time. There did not even appear to be a need for sharp debridement today at either wound location left lower extremity. She did have new areas on the right lower extremity which I think is likely related to venous flow as well although the patient is unsure of that. With that being said there is no signs of active infection at this time. Integumentary (Hair, Skin) Wound #10 status is Open. Original cause of wound was Blister. The wound is located on the Left,Proximal,Lateral Lower Leg. The wound measures 0.1cm length x  0.1cm width x 0.1cm depth; 0.008cm^2 area and 0.001cm^3 volume. There is Fat Layer (Subcutaneous Tissue) Exposed exposed. There is no tunneling or undermining noted. There is a medium amount of serosanguineous drainage noted. There is large (67-100%) pink granulation within the wound bed. There is a small (1-33%) amount of necrotic tissue within the wound bed including Adherent Slough. Wound #11 status is Open. Original cause of wound was Gradually Appeared. The wound is located on the Right,Medial Lower Leg.  The wound measures 1cm length x 1cm width x 0.1cm depth; 0.785cm^2 area and 0.079cm^3 volume. There is Fat Layer (Subcutaneous Tissue) Exposed exposed. There is no tunneling or undermining noted. There is a medium amount of serosanguineous drainage noted. There is medium (34-66%) pink granulation within the wound bed. There is a medium (34-66%) amount of necrotic tissue within the wound bed including Adherent Slough. Wound #5 status is Open. Original cause of wound was Gradually Appeared. The wound is located on the Left,Medial Lower Leg. The wound measures 1.1cm length x 0.8cm width x 0.1cm depth; 0.691cm^2 area and 0.069cm^3 volume. There is Fat Layer (Subcutaneous Tissue) Exposed Galbraith, Bevelyn J. ([id number]) exposed. There is no tunneling or undermining noted. There is a medium amount of serosanguineous drainage noted. There is large (67-100%) pink granulation within the wound bed. There is a small (1-33%) amount of necrotic tissue within the wound bed including Adherent Slough. Wound #6 status is Open. Original cause of wound was Gradually Appeared. The wound is located on the Left,Lateral Lower Leg. The wound measures 1.7cm length x 1.1cm width x 0.1cm depth; 1.469cm^2 area and 0.147cm^3 volume. There is Fat Layer (Subcutaneous Tissue) Exposed exposed. There is no tunneling or undermining noted. There is a medium amount of serosanguineous drainage noted. The wound margin is flat and intact.  There is large (67-100%) pink granulation within the wound bed. There is a small (1-33%) amount of necrotic tissue within the wound bed including Adherent Slough. Wound #9 status is Open. Original cause of wound was Gradually Appeared. The wound is located on the Left,Lateral,Posterior Lower Leg. The wound measures 0.2cm length x 0.2cm width x 0.1cm depth; 0.031cm^2 area and 0.003cm^3 volume. There is Fat Layer (Subcutaneous Tissue) Exposed exposed. There is no tunneling or undermining noted. There is a medium amount of serosanguineous drainage noted. There is large (67-100%) pink granulation within the wound bed. There is a small (1-33%) amount of necrotic tissue within the wound bed including Adherent Slough. 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 There was a Four Layer Compression Therapy Procedure by Army Melia, RN. Post procedure Diagnosis Wound #10: Same as Pre-Procedure Wound #11 Pre-procedure diagnosis of Wound #11 is a Venous Leg Ulcer located on the Right,Medial Lower Leg . There was a Four Layer Compression Therapy Procedure by Army Melia, RN. Post procedure Diagnosis Wound #11: Same as Pre-Procedure Wound #5 Pre-procedure diagnosis of Wound #5 is a Lymphedema located on the Left,Medial Lower Leg . There was a Four Layer Compression Therapy Procedure by Army Melia, RN. Post procedure Diagnosis Wound #5: Same as Pre-Procedure Wound #6 Pre-procedure diagnosis of Wound #6 is a Venous Leg Ulcer located on the Left,Lateral Lower Leg . There was a Four Layer Compression  Therapy Procedure by Army Melia, RN. Post procedure Diagnosis Wound #6: Same as Pre-Procedure Wound #9 Pre-procedure diagnosis of Wound #9 is a Venous Leg Ulcer located on the Left,Lateral,Posterior Lower Leg . There was a Four Layer Compression Therapy Procedure by Army Melia, RN. Post procedure Diagnosis Wound #9: Same as Pre-Procedure Plan BONDA, LUSBY ([id number]) Anesthetic (add to Medication List): Wound #10 Left,Proximal,Lateral Lower Leg: Topical Lidocaine 4% cream applied to wound  bed prior to debridement (In Clinic Only). Wound #11 Right,Medial Lower Leg: Topical Lidocaine 4% cream applied to wound bed prior to debridement (In Clinic Only). Wound #5 Left,Medial Lower Leg: Topical Lidocaine 4% cream applied to wound bed prior to debridement (In Clinic Only). Wound #6 Left,Lateral Lower Leg: Topical Lidocaine 4% cream applied to wound bed prior to debridement (In Clinic Only). Wound #9 Left,Lateral,Posterior Lower Leg: Topical Lidocaine 4% cream applied to wound bed prior to debridement (In Clinic Only). Skin Barriers/Peri-Wound Care: Wound #10 Left,Proximal,Lateral Lower Leg: Triamcinolone Acetonide Ointment (TCA) - peri-wound Wound #11 Right,Medial Lower Leg: Triamcinolone Acetonide Ointment (TCA) - peri-wound Wound #5 Left,Medial Lower Leg: Triamcinolone Acetonide Ointment (TCA) - peri-wound Wound #6 Left,Lateral Lower Leg: Triamcinolone Acetonide Ointment (TCA) - peri-wound Wound #9 Left,Lateral,Posterior Lower Leg: Triamcinolone Acetonide Ointment (TCA) - peri-wound Primary Wound Dressing: Wound #10 Left,Proximal,Lateral Lower Leg: Alginate Wound #11 Right,Medial Lower Leg: Alginate Wound #5 Left,Medial Lower Leg: Alginate Wound #6 Left,Lateral Lower Leg: Alginate Wound #9 Left,Lateral,Posterior Lower Leg: Alginate Secondary Dressing: Wound #10 Left,Proximal,Lateral Lower Leg: ABD pad Wound #11 Right,Medial Lower Leg: ABD pad Wound #5 Left,Medial  Lower Leg: ABD pad Wound #6 Left,Lateral Lower Leg: ABD pad Wound #9 Left,Lateral,Posterior Lower Leg: ABD pad Dressing Change Frequency: Wound #10 Left,Proximal,Lateral Lower Leg: Change dressing every week Other: - Nurse visit Friday and Monday if needed. Wound #11 Right,Medial Lower Leg: Change dressing every week Other: - Nurse visit Friday and Monday if needed. Wound #5 Left,Medial Lower Leg: Change dressing every week Other: - Nurse visit Friday and Monday if needed. Wound #6 Left,Lateral Lower Leg: Change dressing every week Other: - Nurse visit Friday and Monday if needed. Wound #9 Left,Lateral,Posterior Lower Leg: Change dressing every week Other: - Nurse visit Friday and Monday if needed. Follow-up Appointments: Wound #10 Left,Proximal,Lateral Lower Leg: Return Appointment in 1 week. Nurse Visit as needed Wound #11 Right,Medial Lower Leg: Return Appointment in 1 week. Nurse Visit as needed Wound #5 Left,Medial Lower Leg: Return Appointment in 1 week. Nurse Visit as needed Wound #6 Left,Lateral Lower Leg: KINDA, OEN ([id number]) Return Appointment in 1 week. Nurse Visit as needed Wound #9 Left,Lateral,Posterior Lower Leg: Return Appointment in 1 week. Nurse Visit as needed Edema Control: Wound #10 Left,Proximal,Lateral Lower Leg: 4 Layer Compression System - Bilateral Elevate legs to the level of the heart and pump ankles as often as possible Wound #11 Right,Medial Lower Leg: 4 Layer Compression System - Bilateral Elevate legs to the level of the heart and pump ankles as often as possible Wound #5 Left,Medial Lower Leg: 4 Layer Compression System - Bilateral Elevate legs to the level of the heart and pump ankles as often as possible Wound #6 Left,Lateral Lower Leg: 4 Layer Compression System - Bilateral Elevate legs to the level of the heart and pump ankles as often as possible Wound #9 Left,Lateral,Posterior Lower Leg: 4 Layer Compression System -  Bilateral Elevate legs to the level of the heart and pump ankles as often as possible 1. My suggestion currently is can be that we go ahead and continue with the wound care measures as before utilizing the Aquacel which seems to be doing well for her she seems to think that she may be having a reaction to the silver cell. 2. I would recommend as well we continue with the triamcinolone to the legs. 3. I am also can recommend that we continue with the 4 layer compression wrap which we will now apply bilaterally. We will see patient back  for reevaluation in 1 week here in the clinic. If anything worsens or changes patient will contact our office for additional recommendations. Electronic Signature(s) Signed: 02/04/2020 9:30:44 AM By: Worthy Keeler PA-C Entered By: Worthy Keeler on 02/04/2020 09:30:44 Frericks, [name] -------------------------------------------------------------------------------- SuperBill Details Patient Name: SENOVIA, VANKOEVERING. Date of Service: 02/04/2020 Medical Record Number: [id number] Patient Account Number: [id number] Date of Birth/Sex: 07/06/1946 (74 y.o. 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 Physician Procedures CPT4 Code: [id number] Description: [id number] - WC PHYS  LEVEL 3 - EST PT Modifier: Quantity: 1 CPT4 Code: Description: ICD-10 Diagnosis Description L97.221 Non-pressure chronic ulcer of left calf limited to breakdown of skin I87.332 Chronic venous hypertension (idiopathic) with ulcer and inflammation of left l I89.0 Lymphedema, not elsewhere classified  I82.592 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 lower ext Modifier: ower extremity remity Quantity: Electronic Signature(s) Signed: 02/04/2020 9:31:08 AM By: Worthy Keeler PA-C Entered By: Worthy Keeler on 02/04/2020 09:31:08

## 2020-02-04 NOTE — Progress Notes (Signed)
LERA, BUNDRICK ([id number]) Visit Report for 02/04/2020 Arrival Information Details Patient Name: [name], [name]. Date of Service: 02/04/2020 8:15 AM Medical Record Number: [id number] Patient Account Number: [id number] Date of Birth/Sex: Jul 14, 1946 (74 y.o. F) Treating RN: Montey Hora Primary Care Gibran Veselka: [name] Other Clinician: Referring Fedra Lanter: [name] Treating Tylin Force/Extender: Melburn Hake, HOYT Weeks in Treatment: 54 Visit Information History Since Last Visit Added or deleted any medications: No Patient Arrived: Ambulatory Any new allergies or adverse reactions: No Arrival Time: 08:31 Had a fall or experienced change in No Accompanied By: self activities of daily living that may affect Transfer Assistance: None risk of falls: Patient Identification Verified: Yes Signs or symptoms of abuse/neglect since last visito No Secondary Verification Process Completed: Yes Hospitalized since last visit: No Patient Requires Transmission-Based No Implantable device outside of the clinic excluding No Precautions: cellular tissue based products placed in the center Patient Has Alerts: Yes since last visit: Patient Alerts: Patient on Blood Has Dressing in Place as Prescribed: Yes Thinner Has Compression in Place as Prescribed: Yes aspirin 81 Pain Present Now: Yes Electronic Signature(s) Signed: 02/04/2020 4:50:17 PM By: Montey Hora Entered By: Montey Hora on 02/04/2020 08:33:44 Peel, [name] ([id number]) -------------------------------------------------------------------------------- Compression Therapy Details Patient Name: Amber, [name]. Date of Service: 02/04/2020 8:15 AM Medical Record Number: [id number] Patient Account Number: [id number] Date of Birth/Sex: [birth year] (74 y.o. F) Treating RN: Army Melia Primary Care Gilad Dugger: [name] Other Clinician: Referring Nainika Newlun: [name] Treating Antolin Belsito/Extender: STONE III, HOYT Weeks  in Treatment: 60 Compression Therapy Performed for Wound Assessment: Wound #10 Left,Proximal,Lateral Lower Leg Performed By: Clinician Army Melia, RN Compression Type: Four Layer Post Procedure Diagnosis Same as Pre-procedure Electronic Signature(s) Signed: 02/04/2020 3:58:38 PM By: Army Melia Entered By: Army Melia on 02/04/2020 09:05:17 [name] ([id number]) -------------------------------------------------------------------------------- Compression Therapy Details Patient Name: Amber, [name]. Date of Service: 02/04/2020 8:15 AM Medical Record Number: [id number] Patient Account Number: [id number] Date of Birth/Sex: December 11, 1945 (74 y.o. F) Treating RN: Army Melia Primary Care Darreon Lutes: [name] Other Clinician: Referring Dai Mcadams: [name] Treating Cheron Pasquarelli/Extender: STONE III, HOYT Weeks in Treatment: 60 Compression Therapy Performed for Wound Assessment: Wound #11 Right,Medial Lower Leg Performed By: Clinician Army Melia, RN Compression Type: Four Layer Post Procedure Diagnosis Same as Pre-procedure Electronic Signature(s) Signed: 02/04/2020 3:58:38 PM By: Army Melia Entered By: Army Melia on 02/04/2020 09:05:17 [name] ([id number]) -------------------------------------------------------------------------------- Compression Therapy Details Patient Name: Amber, [name]. Date of Service: 02/04/2020 8:15 AM Medical Record Number: [id number] Patient Account Number: [id number] Date of Birth/Sex: October 09, 1946 (74 y.o. F) Treating RN: Army Melia Primary Care Viktoriya Glaspy: [name] Other Clinician: Referring Finch Costanzo: [name] Treating Tiyana Galla/Extender: STONE III, HOYT Weeks in Treatment: 60 Compression Therapy Performed for Wound Assessment: Wound #5 Left,Medial Lower Leg Performed By: Clinician Army Melia, RN Compression Type: Four Layer Post Procedure Diagnosis Same as Pre-procedure Electronic Signature(s) Signed:  02/04/2020 3:58:38 PM By: Army Melia Entered By: Army Melia on 02/04/2020 09:05:17 [name] ([id number]) -------------------------------------------------------------------------------- Compression Therapy Details Patient Name: Amber, [name]. Date of Service: 02/04/2020 8:15 AM Medical Record Number: [id number] Patient Account Number: [id number] Date of Birth/Sex: [birth year] (74 y.o. F) Treating RN: Army Melia Primary Care Izabela Ow: [name] Other Clinician: Referring Jonique Kulig: [name] Treating Afton Mikelson/Extender: STONE III, HOYT Weeks in Treatment: 60 Compression Therapy Performed for Wound Assessment: Wound #6 Left,Lateral Lower Leg Performed By: Clinician Army Melia, RN Compression Type: Four Layer Post Procedure Diagnosis Same as Pre-procedure Electronic Signature(s) Signed: 02/04/2020 3:58:38 PM By:  Nicki Reaper, Dajea Entered By: Army Melia on 02/04/2020 09:05:17 [name] ([id number]) -------------------------------------------------------------------------------- Compression Therapy Details Patient Name: Amber, [name]. Date of Service: 02/04/2020 8:15 AM Medical Record Number: [id number] Patient Account Number: [id number] Date of Birth/Sex: [birth year] (74 y.o. F) Treating RN: Army Melia Primary Care Elena Davia: [name] Other Clinician: Referring Manveer Gomes: [name] Treating Stevin Bielinski/Extender: STONE III, HOYT Weeks in Treatment: 60 Compression Therapy Performed for Wound Assessment: Wound #9 Left,Lateral,Posterior Lower Leg Performed By: Clinician Army Melia, RN Compression Type: Four Layer Post Procedure Diagnosis Same as Pre-procedure Electronic Signature(s) Signed: 02/04/2020 3:58:38 PM By: Army Melia Entered By: Army Melia on 02/04/2020 09:05:17 [name] ([id number]) -------------------------------------------------------------------------------- Encounter Discharge Information Details Patient Name: Amber, [name]. Date of Service: 02/04/2020 8:15 AM Medical Record Number: [id number] Patient Account Number: [id number] Date of Birth/Sex: September 28, 1946 (74 y.o. F) Treating RN: Army Melia Primary Care Gadge Hermiz: [name] Other Clinician: Referring Aeon Koors: [name] Treating Bindi Klomp/Extender: Melburn Hake, HOYT Weeks in Treatment: 41 Encounter Discharge Information Items Discharge Condition: Stable Ambulatory Status: Ambulatory Discharge Destination: Home Transportation: Private Auto Accompanied By: self Schedule Follow-up Appointment: Yes Clinical Summary of Care: Electronic Signature(s) Signed: 02/04/2020 3:58:38 PM By: Army Melia Entered By: Army Melia on 02/04/2020 09:07:48 Russomanno, [name] ([id number]) -------------------------------------------------------------------------------- Lower Extremity Assessment Details Patient Name: DELLANIRA, ZOELLNER. Date of Service: 02/04/2020 8:15 AM Medical Record Number: [id number] Patient Account Number: [id number] Date of Birth/Sex: August 10, 1946 (74 y.o. F) Treating RN: Montey Hora Primary Care Marlena Barbato: [name] Other Clinician: Referring Abisola Carrero: [name] Treating Travarus Trudo/Extender: STONE III, HOYT Weeks in Treatment: 60 Edema Assessment Assessed: [Left: No] [Right: No] Edema: [Left: Ye] [Right: s] Calf Left: Right: Point of Measurement: 33 cm From Medial Instep 47 cm cm Ankle Left: Right: Point of Measurement: 10 cm From Medial Instep 22.5 cm cm Vascular Assessment Pulses: Dorsalis Pedis Palpable: [Left:Yes] Electronic Signature(s) Signed: 02/04/2020 4:50:17 PM By: Montey Hora Entered By: Montey Hora on 02/04/2020 08:42:14 Bressman, [name] ([id number]) -------------------------------------------------------------------------------- Multi Wound Chart Details Patient Name: CAMARON, DEBERNARDI. Date of Service: 02/04/2020 8:15 AM Medical Record Number: [id number] Patient Account Number: [id number] Date of  Birth/Sex: 1946/04/26 (74 y.o. F) Treating RN: Army Melia Primary Care Aowyn Rozeboom: [name] Other Clinician: Referring Gerell Fortson: [name] Treating Reida Hem/Extender: STONE III, HOYT Weeks in Treatment: 60 Vital Signs Height(in): 57 Pulse(bpm): 25 Weight(lbs): 224.7 Blood Pressure(mmHg): 149/68 Body Mass Index(BMI): 40 Temperature(F): 98.2 Respiratory Rate(breaths/min): 16 Photos: Wound Location: Left, Proximal, Lateral Lower Leg Right, Medial Lower Leg Left, Medial Lower Leg Wounding Event: Blister Gradually Appeared Gradually Appeared Primary Etiology: Venous Leg Ulcer Venous Leg Ulcer Lymphedema Comorbid History: Cataracts, Asthma, Sleep Apnea, Cataracts, Asthma, Sleep Apnea, Cataracts, Asthma, Sleep Apnea, Deep Vein Thrombosis, Deep Vein Thrombosis, Deep Vein Thrombosis, Hypertension, Peripheral Venous Hypertension, Peripheral Venous Hypertension, Peripheral Venous Disease, Osteoarthritis, Received Disease, Osteoarthritis, Received Disease, Osteoarthritis, Received Chemotherapy, Received Radiation Chemotherapy, Received Radiation Chemotherapy, Received Radiation Date Acquired: 01/27/2020 02/04/2020 11/19/2018 Weeks of Treatment: 1 0 60 Wound Status: Open Open Open Measurements L x W x D (cm) 0.1x0.1x0.1 1x1x0.1 1.1x0.8x0.1 Area (cm) : 0.008 0.785 0.691 Volume (cm) : 0.001 0.079 0.069 % Reduction in Area: 99.30% N/A 91.90% % Reduction in Volume: 99.10% N/A 91.90% Classification: Full Thickness Without Exposed Full Thickness Without Exposed Full Thickness Without Exposed Support Structures Support Structures Support Structures Exudate Amount: Medium Medium Medium Exudate Type: Serosanguineous Serosanguineous Serosanguineous Exudate Color: red, brown red, brown red, brown Wound Margin: N/A N/A N/A Granulation Amount: Large (67-100%) Medium (34-66%) Large (67-100%) Granulation  Quality: Pink Pink Pink Necrotic Amount: Small (1-33%) Medium (34-66%) Small  (1-33%) Exposed Structures: Fat Layer (Subcutaneous Tissue) Fat Layer (Subcutaneous Tissue) Fat Layer (Subcutaneous Tissue) Exposed: Yes Exposed: Yes Exposed: Yes Fascia: No Fascia: No Fascia: No Tendon: No Tendon: No Tendon: No Muscle: No Muscle: No Muscle: No Joint: No Joint: No Joint: No Bone: No Bone: No Bone: No Epithelialization: None Small (1-33%) None Wound Number: 6 9 N/A Photos: N/A JAMILA, MIHALICK ([id number]) Wound Location: Left, Lateral Lower Leg Left, Lateral, Posterior Lower Leg N/A Wounding Event: Gradually Appeared Gradually Appeared N/A Primary Etiology: Venous Leg Ulcer Venous Leg Ulcer N/A Comorbid History: Cataracts, Asthma, Sleep Apnea, Cataracts, Asthma, Sleep Apnea, N/A Deep Vein Thrombosis, Deep Vein Thrombosis, Hypertension, Peripheral Venous Hypertension, Peripheral Venous Disease, Osteoarthritis, Received Disease, Osteoarthritis, Received Chemotherapy, Received Radiation Chemotherapy, Received Radiation Date Acquired: 01/19/2019 07/08/2019 N/A Weeks of Treatment: 54 30 N/A Wound Status: Open Open N/A Measurements L x W x D (cm) 1.7x1.1x0.1 0.2x0.2x0.1 N/A Area (cm) : 1.469 0.031 N/A Volume (cm) : 0.147 0.003 N/A % Reduction in Area: -567.70% 94.50% N/A % Reduction in Volume: -568.20% 94.70% N/A Classification: Full Thickness Without Exposed Full Thickness Without Exposed N/A Support Structures Support Structures Exudate Amount: Medium Medium N/A Exudate Type: Serosanguineous Serosanguineous N/A Exudate Color: red, brown red, brown N/A Wound Margin: Flat and Intact N/A N/A Granulation Amount: Large (67-100%) Large (67-100%) N/A Granulation Quality: Pink Pink N/A Necrotic Amount: Small (1-33%) Small (1-33%) N/A Exposed Structures: Fat Layer (Subcutaneous Tissue) Fat Layer (Subcutaneous Tissue) N/A Exposed: Yes Exposed: Yes Fascia: No Fascia: No Tendon: No Tendon: No Muscle: No Muscle: No Joint: No Joint: No Bone: No Bone:  No Epithelialization: None Medium (34-66%) N/A Treatment Notes Electronic Signature(s) Signed: 02/04/2020 3:58:38 PM By: Army Melia Entered By: Army Melia on 02/04/2020 Fisher, [name] ([id number]) -------------------------------------------------------------------------------- Multi-Disciplinary Care Plan Details Patient Name: QUENTIN, ABRAMOWICZ. Date of Service: 02/04/2020 8:15 AM Medical Record Number: [id number] Patient Account Number: [id number] Date of Birth/Sex: 11/14/1945 (74 y.o. F) Treating RN: Army Melia Primary Care Christene Pounds: [name] Other Clinician: Referring Eliyanah Elgersma: [name] Treating Yassine Brunsman/Extender: Melburn Hake, HOYT Weeks in Treatment: 60 Active Inactive Medication Nursing Diagnoses: Knowledge deficit related to medication safety: actual or potential Goals: Patient/caregiver will demonstrate understanding of new oral/IV medications prescribed at the [facility] (topical prescriptions are covered under the skin breakdown problem) Date Initiated: 12/16/2019 Target Resolution Date: 01/13/2020 Goal Status: Active Interventions: Assess for medication contraindications each visit where new medications are prescribed Treatment Activities: New medication prescribed at [facility] : 12/16/2019 Notes: Soft Tissue Infection Nursing Diagnoses: Impaired tissue integrity Goals: Patient's soft tissue infection will resolve Date Initiated: 12/10/2018 Target Resolution Date: 01/09/2019 Goal Status: Active Interventions: Assess signs and symptoms of infection every visit Notes: Venous Leg Ulcer Nursing Diagnoses: Actual venous Insuffiency (use after diagnosis is confirmed) Goals: Patient will maintain optimal edema control Date Initiated: 12/10/2018 Target Resolution Date: 01/09/2019 Goal Status: Active Interventions: Assess peripheral edema status every visit. Treatment Activities: Therapeutic compression applied : 12/10/2018 SHANEN, MACFADDEN  ([id number]) Notes: Wound/Skin Impairment Nursing Diagnoses: Impaired tissue integrity Goals: Patient/caregiver will verbalize understanding of skin care regimen Date Initiated: 12/10/2018 Target Resolution Date: 01/09/2019 Goal Status: Active Interventions: Assess ulceration(s) every visit Treatment Activities: Topical wound management initiated : 12/10/2018 Notes: Electronic Signature(s) Signed: 02/04/2020 3:58:38 PM By: Army Melia Entered By: Army Melia on 02/04/2020 09:02:46 Winslett, [name] ([id number]) -------------------------------------------------------------------------------- Pain Assessment Details Patient Name: DARRYN, BROMBERG. Date of Service: 02/04/2020 8:15 AM Medical Record  Number: [id number] Patient Account Number: [id number] Date of Birth/Sex: 30-Jul-1946 (74 y.o. F) Treating RN: Montey Hora Primary Care Jackqulyn Mendel: [name] Other Clinician: Referring Antia Rahal: [name] Treating Aiana Nordquist/Extender: STONE III, HOYT Weeks in Treatment: 60 Active Problems Location of Pain Severity and Description of Pain Patient Has Paino Yes Site Locations Rate the pain. Current Pain Level: 2 Pain Management and Medication Current Pain Management: Electronic Signature(s) Signed: 02/04/2020 4:50:17 PM By: Montey Hora Entered By: Montey Hora on 02/04/2020 08:34:03 Wahid, [name] ([id number]) -------------------------------------------------------------------------------- Patient/Caregiver Education Details Patient Name: UROOJ, SERENO. Date of Service: 02/04/2020 8:15 AM Medical Record Number: [id number] Patient Account Number: [id number] Date of Birth/Gender: Mar 12, 1946 (74 y.o. F) Treating RN: Army Melia Primary Care Physician: [name] Other Clinician: Referring Physician: [name] Treating Physician/Extender: Sharalyn Ink in Treatment: 35 Education Assessment Education Provided To: Patient Education Topics  Provided Wound/Skin Impairment: Handouts: Caring for Your Ulcer Methods: Demonstration, Explain/Verbal Responses: State content correctly Electronic Signature(s) Signed: 02/04/2020 3:58:38 PM By: Army Melia Entered By: Army Melia on 02/04/2020 09:07:00 [name] ([id number]) -------------------------------------------------------------------------------- Wound Assessment Details Patient Name: ELPIDA, MONGAR. Date of Service: 02/04/2020 8:15 AM Medical Record Number: [id number] Patient Account Number: [id number] Date of Birth/Sex: 1946-04-04 (74 y.o. F) Treating RN: Montey Hora Primary Care Rahman Ferrall: [name] Other Clinician: Referring Alessio Bogan: [name] Treating Zaion Hreha/Extender: STONE III, HOYT Weeks in Treatment: 60 Wound Status Wound Number: 10 Primary Venous Leg Ulcer Etiology: Wound Location: Left, Proximal, Lateral Lower Leg Wound Open Wounding Event: Blister Status: Date Acquired: 01/27/2020 Comorbid Cataracts, Asthma, Sleep Apnea, Deep Vein Thrombosis, Weeks Of Treatment: 1 History: Hypertension, Peripheral Venous Disease, Osteoarthritis, Clustered Wound: No Received Chemotherapy, Received Radiation Photos Wound Measurements Length: (cm) 0.1 Width: (cm) 0.1 Depth: (cm) 0.1 Area: (cm) 0.008 Volume: (cm) 0.001 % Reduction in Area: 99.3% % Reduction in Volume: 99.1% Epithelialization: None Tunneling: No Undermining: No Wound Description Classification: Full Thickness Without Exposed Support Structu Exudate Amount: Medium Exudate Type: Serosanguineous Exudate Color: red, brown res Foul Odor After Cleansing: No Slough/Fibrino Yes Wound Bed Granulation Amount: Large (67-100%) Exposed Structure Granulation Quality: Pink Fascia Exposed: No Necrotic Amount: Small (1-33%) Fat Layer (Subcutaneous Tissue) Exposed: Yes Necrotic Quality: Adherent Slough Tendon Exposed: No Muscle Exposed: No Joint Exposed: No Bone Exposed:  No Treatment Notes Wound #10 (Left, Proximal, Lateral Lower Leg) Notes alginate, ABD, 4 layer TCA Electronic Signature(s) Signed: 02/04/2020 4:50:17 PM By: Lambert Mody ([id number]) Entered By: Montey Hora on 02/04/2020 08:50:28 Picinich, [name] ([id number]) -------------------------------------------------------------------------------- Wound Assessment Details Patient Name: SCHYLAR, BENITO. Date of Service: 02/04/2020 8:15 AM Medical Record Number: [id number] Patient Account Number: [id number] Date of Birth/Sex: 10/16/1946 (74 y.o. F) Treating RN: Montey Hora Primary Care Karisa Nesser: [name] Other Clinician: Referring Seanmichael Salmons: [name] Treating Ala Capri/Extender: STONE III, HOYT Weeks in Treatment: 60 Wound Status Wound Number: 11 Primary Venous Leg Ulcer Etiology: Wound Location: Right, Medial Lower Leg Wound Open Wounding Event: Gradually Appeared Status: Date Acquired: 02/04/2020 Comorbid Cataracts, Asthma, Sleep Apnea, Deep Vein Thrombosis, Weeks Of Treatment: 0 History: Hypertension, Peripheral Venous Disease, Osteoarthritis, Clustered Wound: No Received Chemotherapy, Received Radiation Photos Wound Measurements Length: (cm) 1 Width: (cm) 1 Depth: (cm) 0.1 Area: (cm) 0.785 Volume: (cm) 0.079 % Reduction in Area: % Reduction in Volume: Epithelialization: Small (1-33%) Tunneling: No Undermining: No Wound Description Classification: Full Thickness Without Exposed Support Structu Exudate Amount: Medium Exudate Type: Serosanguineous Exudate Color: red, brown res Foul Odor After Cleansing: No Slough/Fibrino Yes Wound Bed Granulation Amount:  Medium (34-66%) Exposed Structure Granulation Quality: Pink Fascia Exposed: No Necrotic Amount: Medium (34-66%) Fat Layer (Subcutaneous Tissue) Exposed: Yes Necrotic Quality: Adherent Slough Tendon Exposed: No Muscle Exposed: No Joint Exposed: No Bone Exposed: No Treatment  Notes Wound #11 (Right, Medial Lower Leg) Notes alginate, ABD, 4 layer TCA Electronic Signature(s) Signed: 02/04/2020 4:50:17 PM By: Lambert Mody ([id number]) Entered By: Montey Hora on 02/04/2020 08:56:24 Tellefsen, [name] ([id number]) -------------------------------------------------------------------------------- Wound Assessment Details Patient Name: CONSWELLO, PEVEY. Date of Service: 02/04/2020 8:15 AM Medical Record Number: [id number] Patient Account Number: [id number] Date of Birth/Sex: 1946-08-23 (74 y.o. F) Treating RN: Montey Hora Primary Care Shalynn Jorstad: [name] Other Clinician: Referring Froilan Mclean: [name] Treating Malayshia All/Extender: STONE III, HOYT Weeks in Treatment: 60 Wound Status Wound Number: 5 Primary Lymphedema Etiology: Wound Location: Left, Medial Lower Leg Wound Open Wounding Event: Gradually Appeared Status: Date Acquired: 11/19/2018 Comorbid Cataracts, Asthma, Sleep Apnea, Deep Vein Thrombosis, Weeks Of Treatment: 60 History: Hypertension, Peripheral Venous Disease, Osteoarthritis, Clustered Wound: No Received Chemotherapy, Received Radiation Photos Wound Measurements Length: (cm) 1.1 Width: (cm) 0.8 Depth: (cm) 0.1 Area: (cm) 0.691 Volume: (cm) 0.069 % Reduction in Area: 91.9% % Reduction in Volume: 91.9% Epithelialization: None Tunneling: No Undermining: No Wound Description Classification: Full Thickness Without Exposed Support Structu Exudate Amount: Medium Exudate Type: Serosanguineous Exudate Color: red, brown res Foul Odor After Cleansing: No Slough/Fibrino Yes Wound Bed Granulation Amount: Large (67-100%) Exposed Structure Granulation Quality: Pink Fascia Exposed: No Necrotic Amount: Small (1-33%) Fat Layer (Subcutaneous Tissue) Exposed: Yes Necrotic Quality: Adherent Slough Tendon Exposed: No Muscle Exposed: No Joint Exposed: No Bone Exposed: No Treatment Notes Wound #5 (Left, Medial  Lower Leg) Notes alginate, ABD, 4 layer TCA Electronic Signature(s) Signed: 02/04/2020 4:50:17 PM By: Lambert Mody ([id number]) Entered By: Montey Hora on 02/04/2020 08:51:06 Erhart, [name] ([id number]) -------------------------------------------------------------------------------- Wound Assessment Details Patient Name: SILENA, CORTEZ. Date of Service: 02/04/2020 8:15 AM Medical Record Number: [id number] Patient Account Number: [id number] Date of Birth/Sex: [birth year] (74 y.o. F) Treating RN: Montey Hora Primary Care Marianny Goris: [name] Other Clinician: Referring Shandy Checo: [name] Treating Aniqua Briere/Extender: STONE III, HOYT Weeks in Treatment: 60 Wound Status Wound Number: 6 Primary Venous Leg Ulcer Etiology: Wound Location: Left, Lateral Lower Leg Wound Open Wounding Event: Gradually Appeared Status: Date Acquired: 01/19/2019 Comorbid Cataracts, Asthma, Sleep Apnea, Deep Vein Thrombosis, Weeks Of Treatment: 54 History: Hypertension, Peripheral Venous Disease, Osteoarthritis, Clustered Wound: No Received Chemotherapy, Received Radiation Photos Wound Measurements Length: (cm) 1.7 Width: (cm) 1.1 Depth: (cm) 0.1 Area: (cm) 1.469 Volume: (cm) 0.147 % Reduction in Area: -567.7% % Reduction in Volume: -568.2% Epithelialization: None Tunneling: No Undermining: No Wound Description Classification: Full Thickness Without Exposed Support Structu Wound Margin: Flat and Intact Exudate Amount: Medium Exudate Type: Serosanguineous Exudate Color: red, brown res Foul Odor After Cleansing: No Slough/Fibrino Yes Wound Bed Granulation Amount: Large (67-100%) Exposed Structure Granulation Quality: Pink Fascia Exposed: No Necrotic Amount: Small (1-33%) Fat Layer (Subcutaneous Tissue) Exposed: Yes Necrotic Quality: Adherent Slough Tendon Exposed: No Muscle Exposed: No Joint Exposed: No Bone Exposed: No Treatment Notes Wound #6 (Left,  Lateral Lower Leg) Notes alginate, ABD, 4 layer TCA Electronic Signature(s) JAKALYN, RENNA ([id number]) Signed: 02/04/2020 4:50:17 PM By: Montey Hora Entered By: Montey Hora on 02/04/2020 08:51:48 Mendell, [name] ([id number]) -------------------------------------------------------------------------------- Wound Assessment Details Patient Name: SUMITA, KOLBECK. Date of Service: 02/04/2020 8:15 AM Medical Record Number: [id number] Patient Account Number: [id number] Date of Birth/Sex: 10/14/1946 (74 y.o. F) Treating RN:  Montey Hora Primary Care Gregory Dowe: [name] Other Clinician: Referring Story Vanvranken: [name] Treating Jann Milkovich/Extender: Melburn Hake, HOYT Weeks in Treatment: 60 Wound Status Wound Number: 9 Primary Venous Leg Ulcer Etiology: Wound Location: Left, Lateral, Posterior Lower Leg Wound Open Wounding Event: Gradually Appeared Status: Date Acquired: 07/08/2019 Comorbid Cataracts, Asthma, Sleep Apnea, Deep Vein Thrombosis, Weeks Of Treatment: 30 History: Hypertension, Peripheral Venous Disease, Osteoarthritis, Clustered Wound: No Received Chemotherapy, Received Radiation Photos Wound Measurements Length: (cm) 0.2 Width: (cm) 0.2 Depth: (cm) 0.1 Area: (cm) 0.031 Volume: (cm) 0.003 % Reduction in Area: 94.5% % Reduction in Volume: 94.7% Epithelialization: Medium (34-66%) Tunneling: No Undermining: No Wound Description Classification: Full Thickness Without Exposed Support Structu Exudate Amount: Medium Exudate Type: Serosanguineous Exudate Color: red, brown res Foul Odor After Cleansing: No Slough/Fibrino Yes Wound Bed Granulation Amount: Large (67-100%) Exposed Structure Granulation Quality: Pink Fascia Exposed: No Necrotic Amount: Small (1-33%) Fat Layer (Subcutaneous Tissue) Exposed: Yes Necrotic Quality: Adherent Slough Tendon Exposed: No Muscle Exposed: No Joint Exposed: No Bone Exposed: No Treatment Notes Wound #9 (Left,  Lateral, Posterior Lower Leg) Notes alginate, ABD, 4 layer TCA Electronic Signature(s) Signed: 02/04/2020 4:50:17 PM By: Lambert Mody ([id number]) Entered By: Montey Hora on 02/04/2020 08:52:39 Mayse, [name] -------------------------------------------------------------------------------- Vitals Details Patient Name: KENEDIE, GAJDA. Date of Service: 02/04/2020 8:15 AM Medical Record Number: [id number] Patient Account Number: [id number] Date of Birth/Sex: 1945/12/29 (74 y.o. F) Treating RN: Montey Hora Primary Care Aero Drummonds: [name] Other Clinician: Referring Kekoa Fyock: [name] Treating Mars Scheaffer/Extender: STONE III, HOYT Weeks in Treatment: 60 Vital Signs Time Taken: 08:34 Temperature (F): 98.2 Height (in): 63 Pulse (bpm): 87 Weight (lbs): 224.7 Respiratory Rate (breaths/min): 16 Body Mass Index (BMI): 39.8 Blood Pressure (mmHg): 149/68 Reference Range: 80 - 120 mg / dl Electronic Signature(s) Signed: 02/04/2020 4:50:17 PM By: Montey Hora Entered By: Montey Hora on 02/04/2020 08:35:05

## 2020-02-05 DIAGNOSIS — I890 Lymphedema, not elsewhere classified: Secondary | ICD-10-CM | POA: Diagnosis not present

## 2020-02-05 DIAGNOSIS — G4733 Obstructive sleep apnea (adult) (pediatric): Secondary | ICD-10-CM | POA: Diagnosis not present

## 2020-02-05 DIAGNOSIS — L97211 Non-pressure chronic ulcer of right calf limited to breakdown of skin: Secondary | ICD-10-CM | POA: Diagnosis not present

## 2020-02-05 DIAGNOSIS — E876 Hypokalemia: Secondary | ICD-10-CM | POA: Diagnosis not present

## 2020-02-05 DIAGNOSIS — L97221 Non-pressure chronic ulcer of left calf limited to breakdown of skin: Secondary | ICD-10-CM | POA: Diagnosis not present

## 2020-02-05 DIAGNOSIS — I872 Venous insufficiency (chronic) (peripheral): Secondary | ICD-10-CM | POA: Diagnosis not present

## 2020-02-05 NOTE — Progress Notes (Signed)
Amber, [name] ([id number]) Visit Report for 02/05/2020 Arrival Information Details Patient Name: [name], [name]. Date of Service: 02/05/2020 11:30 AM Medical Record Number: [id number] Patient Account Number: [id number] Date of Birth/Sex: 1946-10-18 (74 y.o. F) Treating RN: Montey Hora Primary Care Wylee Dorantes: [name] Other Clinician: Referring Marguarite Markov: [name] Treating Yarely Bebee/Extender: Melburn Hake, HOYT Weeks in Treatment: 56 Visit Information History Since Last Visit Added or deleted any medications: No Patient Arrived: Ambulatory Any new allergies or adverse reactions: No Arrival Time: 11:40 Had a fall or experienced change in No Accompanied By: self activities of daily living that may affect Transfer Assistance: None risk of falls: Patient Identification Verified: Yes Signs or symptoms of abuse/neglect since last visito No Secondary Verification Process Completed: Yes Hospitalized since last visit: No Patient Requires Transmission-Based No Implantable device outside of the clinic excluding No Precautions: cellular tissue based products placed in the center Patient Has Alerts: Yes since last visit: Patient Alerts: Patient on Blood Has Dressing in Place as Prescribed: Yes Thinner Has Compression in Place as Prescribed: Yes aspirin 81 Pain Present Now: No Electronic Signature(s) Signed: 02/05/2020 12:07:49 PM By: Montey Hora Entered By: Montey Hora on 02/05/2020 12:07:49 Kehm, [name] -------------------------------------------------------------------------------- Compression Therapy Details Patient Name: Amber, [name]. Date of Service: 02/05/2020 11:30 AM Medical Record Number: [id number] Patient Account Number: [id number] Date of Birth/Sex: Feb 21, 1946 (74 y.o. F) Treating RN: Montey Hora Primary Care Randolf Sansoucie: [name] Other Clinician: Referring Delio Slates: [name] Treating Jeliyah Middlebrooks/Extender: STONE III,  HOYT Weeks in Treatment: 60 Compression Therapy Performed for Wound Assessment: Wound #11 Right,Medial Lower Leg Performed By: Clinician Montey Hora, RN Compression Type: Four Layer Electronic Signature(s) Signed: 02/05/2020 12:10:02 PM By: Montey Hora Entered By: Montey Hora on 02/05/2020 12:10:01 [name] -------------------------------------------------------------------------------- Encounter Discharge Information Details Patient Name: Amber, [name]. Date of Service: 02/05/2020 11:30 AM Medical Record Number: [id number] Patient Account Number: [id number] Date of Birth/Sex: 1946/02/11 (74 y.o. F) Treating RN: Montey Hora Primary Care Audia Amick: [name] Other Clinician: Referring Shakemia Madera: [name] Treating Carlene Bickley/Extender: Melburn Hake, HOYT Weeks in Treatment: 29 Encounter Discharge Information Items Discharge Condition: Stable Ambulatory Status: Ambulatory Discharge Destination: Home Transportation: Private Auto Accompanied By: self Schedule Follow-up Appointment: Yes Clinical Summary of Care: Electronic Signature(s) Signed: 02/05/2020 12:11:13 PM By: Montey Hora Entered By: Montey Hora on 02/05/2020 12:11:12 [name] -------------------------------------------------------------------------------- Wound Assessment Details Patient Name: Amber, [name]. Date of Service: 02/05/2020 11:30 AM Medical Record Number: [id number] Patient Account Number: [id number] Date of Birth/Sex: Nov 27, 1945 (74 y.o. F) Treating RN: Montey Hora Primary Care Sedrick Tober: [name] Other Clinician: Referring Alper Guilmette: [name] Treating Shereka Lafortune/Extender: STONE III, HOYT Weeks in Treatment: 60 Wound Status Wound Number: 11 Primary Venous Leg Ulcer Etiology: Wound Location: Right, Medial Lower Leg Wound Open Wounding Event: Gradually Appeared Status: Date Acquired: 02/04/2020 Comorbid Cataracts, Asthma, Sleep  Apnea, Deep Vein Thrombosis, Weeks Of Treatment: 0 History: Hypertension, Peripheral Venous Disease, Osteoarthritis, Clustered Wound: No Received Chemotherapy, Received Radiation Wound Measurements Length: (cm) 1 Width: (cm) 1 Depth: (cm) 0.1 Area: (cm) 0.785 Volume: (cm) 0.079 % Reduction in Area: 0% % Reduction in Volume: 0% Epithelialization: Small (1-33%) Tunneling: No Undermining: No Wound Description Classification: Full Thickness Without Exposed Support Structu Exudate Amount: Medium Exudate Type: Serosanguineous Exudate Color: red, brown res Foul Odor After Cleansing: No Slough/Fibrino Yes Wound Bed Granulation Amount: Medium (34-66%) Exposed Structure Granulation Quality: Pink Fascia Exposed: No Necrotic Amount: Medium (34-66%) Fat Layer (Subcutaneous Tissue) Exposed: Yes Necrotic Quality: Adherent Slough  Tendon Exposed: No Muscle Exposed: No Joint Exposed: No Bone Exposed: No Treatment Notes Wound #11 (Right, Medial Lower Leg) Notes alginate, ABD, 4 layer TCA Electronic Signature(s) Signed: 02/05/2020 12:09:18 PM By: Montey Hora Entered By: Montey Hora on 02/05/2020 12:09:17

## 2020-02-10 ENCOUNTER — Other Ambulatory Visit: Payer: Self-pay

## 2020-02-10 ENCOUNTER — Encounter: Payer: Medicare Other | Admitting: Internal Medicine

## 2020-02-10 DIAGNOSIS — I872 Venous insufficiency (chronic) (peripheral): Secondary | ICD-10-CM | POA: Diagnosis not present

## 2020-02-10 DIAGNOSIS — L97812 Non-pressure chronic ulcer of other part of right lower leg with fat layer exposed: Secondary | ICD-10-CM | POA: Diagnosis not present

## 2020-02-10 DIAGNOSIS — L97211 Non-pressure chronic ulcer of right calf limited to breakdown of skin: Secondary | ICD-10-CM | POA: Diagnosis not present

## 2020-02-10 DIAGNOSIS — G4733 Obstructive sleep apnea (adult) (pediatric): Secondary | ICD-10-CM | POA: Diagnosis not present

## 2020-02-10 DIAGNOSIS — E876 Hypokalemia: Secondary | ICD-10-CM | POA: Diagnosis not present

## 2020-02-10 DIAGNOSIS — L97221 Non-pressure chronic ulcer of left calf limited to breakdown of skin: Secondary | ICD-10-CM | POA: Diagnosis not present

## 2020-02-10 DIAGNOSIS — I890 Lymphedema, not elsewhere classified: Secondary | ICD-10-CM | POA: Diagnosis not present

## 2020-02-11 ENCOUNTER — Other Ambulatory Visit: Payer: Self-pay | Admitting: *Deleted

## 2020-02-11 DIAGNOSIS — I872 Venous insufficiency (chronic) (peripheral): Secondary | ICD-10-CM

## 2020-02-11 DIAGNOSIS — M79605 Pain in left leg: Secondary | ICD-10-CM

## 2020-02-11 DIAGNOSIS — M79604 Pain in right leg: Secondary | ICD-10-CM

## 2020-02-12 ENCOUNTER — Other Ambulatory Visit: Payer: Self-pay

## 2020-02-12 ENCOUNTER — Ambulatory Visit (HOSPITAL_COMMUNITY)
Admission: RE | Admit: 2020-02-12 | Discharge: 2020-02-12 | Disposition: A | Discharge status: Home / Self Care | Payer: Medicare Other | Source: Ambulatory Visit | Attending: Vascular Surgery | Admitting: Vascular Surgery

## 2020-02-12 ENCOUNTER — Ambulatory Visit (INDEPENDENT_AMBULATORY_CARE_PROVIDER_SITE_OTHER): Payer: Medicare Other | Admitting: Physician Assistant

## 2020-02-12 ENCOUNTER — Other Ambulatory Visit: Payer: Self-pay | Admitting: *Deleted

## 2020-02-12 VITALS — BP 157/87 | HR 80 | Resp 18 | Ht 63.0 in | Wt 223.9 lb

## 2020-02-12 DIAGNOSIS — M79604 Pain in right leg: Secondary | ICD-10-CM | POA: Diagnosis not present

## 2020-02-12 DIAGNOSIS — M79605 Pain in left leg: Secondary | ICD-10-CM

## 2020-02-12 DIAGNOSIS — I83892 Varicose veins of left lower extremities with other complications: Secondary | ICD-10-CM | POA: Diagnosis not present

## 2020-02-12 DIAGNOSIS — E876 Hypokalemia: Secondary | ICD-10-CM | POA: Diagnosis not present

## 2020-02-12 DIAGNOSIS — I871 Compression of vein: Secondary | ICD-10-CM

## 2020-02-12 DIAGNOSIS — L97929 Non-pressure chronic ulcer of unspecified part of left lower leg with unspecified severity: Secondary | ICD-10-CM | POA: Diagnosis not present

## 2020-02-12 DIAGNOSIS — G4733 Obstructive sleep apnea (adult) (pediatric): Secondary | ICD-10-CM | POA: Diagnosis not present

## 2020-02-12 DIAGNOSIS — I872 Venous insufficiency (chronic) (peripheral): Secondary | ICD-10-CM

## 2020-02-12 DIAGNOSIS — L97211 Non-pressure chronic ulcer of right calf limited to breakdown of skin: Secondary | ICD-10-CM | POA: Diagnosis not present

## 2020-02-12 DIAGNOSIS — L97221 Non-pressure chronic ulcer of left calf limited to breakdown of skin: Secondary | ICD-10-CM | POA: Diagnosis not present

## 2020-02-12 DIAGNOSIS — I83029 Varicose veins of left lower extremity with ulcer of unspecified site: Secondary | ICD-10-CM | POA: Diagnosis not present

## 2020-02-12 DIAGNOSIS — R609 Edema, unspecified: Secondary | ICD-10-CM

## 2020-02-12 DIAGNOSIS — I890 Lymphedema, not elsewhere classified: Secondary | ICD-10-CM | POA: Diagnosis not present

## 2020-02-12 NOTE — Progress Notes (Signed)
SAKEENA, WINGET ([id number]) Visit Report for 02/10/2020 Debridement Details Patient Name: [name], [name]. Date of Service: 02/10/2020 1:15 PM Medical Record Number: [id number] Patient Account Number: [id number] Date of Birth/Sex: 12/20/1945 (74 y.o. F) Treating RN: Cornell Barman Primary Care Provider: [name] Other Clinician: Referring Provider: [name] Treating Provider/Extender: Tito Dine in Treatment: 61 Debridement Performed for Wound #11 Right,Medial Lower Leg Assessment: Performed By: Physician [name] Dillon, MD Debridement Type: Debridement Severity of Tissue Pre Debridement: Limited to breakdown of skin Level of Consciousness (Pre- Awake and Alert procedure): Pre-procedure Verification/Time Out Yes - 13:41 Taken: Start Time: 13:41 Pain Control: Lidocaine Total Area Debrided (L x W): 1 (cm) x 0.7 (cm) = 0.7 (cm) Tissue and other material debrided: Viable, Non-Viable, Subcutaneous, Skin: Dermis , Fibrin/Exudate Level: Skin/Subcutaneous Tissue Debridement Description: Excisional Instrument: Curette Bleeding: Minimum End Time: 13:42 Response to Treatment: Procedure was tolerated well Level of Consciousness (Post- Awake and Alert procedure): Post Debridement Measurements of Total Wound Length: (cm) 1 Width: (cm) 0.7 Depth: (cm) 0.1 Volume: (cm) 0.055 Character of Wound/Ulcer Post Debridement: Stable Severity of Tissue Post Debridement: Fat layer exposed Post Procedure Diagnosis Same as Pre-procedure Electronic Signature(s) Signed: 02/10/2020 4:54:03 PM By: Linton Ham MD Signed: 02/12/2020 10:29:18 AM By: [name], [name], RN, CWS, [name], [name] Entered By: Linton Ham on 02/10/2020 13:48:38 [place], [name] -------------------------------------------------------------------------------- HPI Details Patient Name: [name], [name]. Date of Service: 02/10/2020 1:15 PM Medical Record Number: [id number] Patient Account  Number: [id number] Date of Birth/Sex: [birth year] (74 y.o. F) Treating RN: Cornell Barman Primary Care Provider: [name] Other Clinician: Referring Provider: [name] Treating Provider/Extender: Tito Dine in Treatment: 19 History of Present Illness HPI Description: Pleasant 74 year old with history of chronic venous insufficiency. No diabetes or peripheral vascular disease. Left ABI 1.29. Questionable history of left lower extremity DVT. She developed a recurrent ulceration on her left lateral calf in December 2015, which she attributes to poor diet and subsequent lower extremity edema. She underwent endovenous laser ablation of her left greater saphenous vein in 2010. She underwent laser ablation of accessory branch of left GSV in April 2016 by Dr. Kellie Simmering at [facility]. She was previously wearing Unna boots, which she tolerated well. Tolerating 2 layer compression and cadexomer iodine. She returns to clinic for follow-up and is without new complaints. She denies any significant pain at this time. She reports persistent pain with pressure. No claudication or ischemic rest pain. No fever or chills. No drainage. READMISSION 11/13/16; this is a 74 year old woman who is not a diabetic. She is here for a review of a painful area on her left medial lower extremity. I note that she was seen here previously last year for wound I believe to be in the same area. At that time she had undergone previously a left greater saphenous vein ablation by Dr. Kellie Simmering and she had a ablation of the anterior accessory branch of the left greater saphenous vein in March 2016. Seeing that the wound actually closed over. In reviewing the history with her today the ulcer in this area has been recurrent. She describes a biopsy of this area in 2009 that only showed stasis physiology. 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 Do not see that  she has ever had arterial studies in the left leg. She tells me she has a follow-up with Dr. Kellie Simmering in roughly 10 days In any case she developed the reopening of this area roughly a month ago. On the background of this she describes rapidly increasing edema which has responded to Lasix 40 mg and metolazone 2.5 mg as well as the patient's lymph massage. She has been told she has both venous insufficiency and lymphedema but she cannot tolerate compression stockings 11/28/16; the patient saw Dr. Kellie Simmering recently. Per the patient he did arterial Dopplers in the office that did not show evidence of arterial insufficiency, per the patient he stated "treat this like an ordinary venous ulcer". She also saw her dermatologist Dr. Ronnald Ramp who felt that this was more of a vascular ulcer. In general things are improving although she arrives today with increasing bilateral lower extremity edema with weeping a deeper fluid through the wound on the left medial leg compatible with some degree of lymphedema 12/04/16; the patient's wound is fully epithelialized but I don't think fully healed. We will do another week of depression with Promogran and TCA however I suspect we'll be able to discharge her next week. This is a very unusual-looking wound which was initially a figure-of-eight type wound lying on its side surrounded by petechial like hemorrhage. She has had venous ablation on this side. She apparently does not have an arterial issue per Dr. Kellie Simmering. She saw her dermatologist thought it was "vascular". Patient is definitely going to need ongoing compression and I talked about this with her today she will go to elastic therapy after she leaves here next week 12/11/16; the patient's wound is not completely closed today. She has surrounding scar tissue and in  further discussion with the patient it would appear that she had ulcers in this area in 2009 for a prolonged period of time ultimately requiring a punch biopsy of this area that only showed venous insufficiency. I did not previously pickup on this part of the history from the patient. 12/18/16; the patient's wound is completely epithelialized. There is no open area here. She has significant bilateral venous insufficiency with secondary lymphedema to a mild-to-moderate degree she does not have compression stockings.. She did not say anything to me when I was in the room, she told our intake nurse that she was still having pain in this area. This isn't unusual recurrent small open area. She is going to go to elastic therapy to obtain compression stockings. 12/25/16; the patient's wound is fully epithelialized. There is no open area here. The patient describes some continued episodic discomfort in this area medial left calf. However everything looks fine and healed here. She is been to elastic therapy and caught herself 15-20 mmHg stockings, they apparently were having trouble getting 20-30 mm stockings in her size 01/22/17; this is a patient we discharged from the clinic a month ago. She has a recurrent open wound on her medial left calf. She had 15 mm support stockings. I told her I thought she needed 20-30 mm compression stockings. She tells me that she has been ill with hospitalization secondary to asthma and is been found to have severe hypokalemia likely secondary to a combination of Lasix and metolazone. This morning she noted blistering and leaking fluid on the posterior part of her left leg. She called our intake nurse urgently and we was saw her this afternoon. She has not had any real discomfort here. I don't know that she's been wearing  any stockings on this leg for at least 2-3 days. ABIs in this clinic were 1.21 on the right and 1.3 on the left. She is previously seen vascular surgery who does  not think that there is a peripheral arterial issue. 01/30/17; Patient arrives with no open wound on the left leg. She has been to elastic therapy and obtained 20-34mmhg below knee stockings and she has one on the right leg today. READMISSION 02/19/18; this Webbe is a now 74 year old patient we've had in this clinic perhaps 3 times before. I had last looked at her from January 07 December 2016 with an area on the medial left leg. We discharged her on 12/25/16 however she had to be readmitted on 01/22/17 with a recurrence. I have in my notes that we discharged her on 20-30 mm stockings although she tells me she was only wearing support hose because she cannot get stockings on predominantly related to her cervical spine surgery/issues. She has had previous ablations done by vein and vascular in [place] including a great saphenous vein ablation on the left with an anterior accessory branch ablation I think both of these were in 2016. On one of the previous visit she had a biopsy noted 2009 that was negative. She is not felt to have an arterial issue. She is not a diabetic. She does have a history of obstructive sleep apnea hypertension asthma as well as chronic venous insufficiency and lymphedema. MORENA, GERARDI ([id number]) On this occasion she noted 2 dry scaly patch on her left leg. She tried to put lotion on this it didn't really help. There were 2 open areas.the patient has been seeing her primary physician from 02/05/18 through 02/14/18. She had Unna boots applied. The superior wound now on the lateral left leg has closed but she's had one wound that remains open on the lateral left leg. This is not the same spot as we dealt with in 2018. ABIs in this clinic were 1.3 bilaterally 02/26/18; patient has a small wound on the left lateral calf. Dimensions are down. She has chronic venous insufficiency and lymphedema. 03/05/18; small open area on the left lateral calf. Dimensions are down. Tightly adherent  necrotic debris over the surface of the wound which was difficult to remove. Also the dressing [over collagen] stuck to the wound surface. This was removed with some difficulty as well. Change the primary dressing to Hydrofera Blue ready 03/12/18; small open area on the left lateral calf. Comes in with tightly adherent surface eschar as well as some adherent Hydrofera Blue. 03/19/18; open area on the left lateral calf. Again adherent surface eschar as well as some adherent Hydrofera Blue nonviable subcutaneous tissue. She complained of pain all week even with the reduction from 4-3 layer compression I put on last week. Also she had an increase in her ankle and calf measurements probably related to the same thing. 03/26/18; open area on the left lateral calf. A very small open area remains here. We used silver alginate starting last week as the Hydrofera Blue seem to stick to the wound bed. In using 4-layer compression 04/02/18; the open area in the left lateral calf at some adherent slough which I removed there is no open area here. We are able to transition her into her own compression stocking. Truthfully I think this is probably his support hose. However this does not maintain skin integrity will be limited. She cannot put over the toe compression stockings on because of neck problems hand problems etc. She  is allergic to the lining layer of juxta lites. We might be forced to use extremitease stocking should this fail READMIT 11/24/2018 Patient is now a 74 year old woman who is not a diabetic. She has been in this clinic on at least 3 previous occasions largely with recurrent wounds on her left leg secondary to chronic venous insufficiency with secondary lymphedema. Her situation is complicated by inability to get stockings on and an allergy to neoprene which is apparently a component and at least juxta lites and other stockings. As a result she really has not been wearing any stockings on her  legs. She tells [place] that roughly 2 or 3 weeks ago she started noticing a stinging sensation just above her ankle on the left medial aspect. She has been diagnosed with pseudogout and she wondered whether this was what she was experiencing. She tried to dress this with something she bought at the store however subsequently it pulled skin off and now she has an open wound that is not improving. She has been using Vaseline gauze with a cover bandage. She saw her primary doctor last week who put an Haematologist on her. ABIs in this clinic was 1.03 on the left 2/12; the area is on the left medial ankle. Odd-looking wound with what looks to be surface epithelialization but a multitude of small petechial openings. This clearly not closed yet. We have been using silver alginate under 3 layer compression with TCA 2/19; the wound area did not look quite as good this week. Necrotic debris over the majority of the wound surface which required debridement. She continues to have a multitude of what looked to be small petechial openings. She reminds [place] that she had a biopsy on this initially during her first outbreak in 2015 in [place] dermatology. She expresses concern about this being a possible melanoma. 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 I assured her that this does not look anything like melanoma. Except for the petechial reaction it does look like a venous insufficiency area and she certainly has evidence of this on both sides 2/26; a difficult area on the left medial ankle. The patient clearly has chronic venous hypertension with some degree of lymphedema. The odd thing about the area is the small petechial hemorrhages. I am not really sure how to explain this. This was present last time and this is not a compression injury. We have been using Hydrofera Blue which I changed to last week 3/4; still using Hydrofera  Blue. Aggressive debridement today. She does not have known arterial issues. She has seen Dr. Kellie Simmering at [facility] vein and vascular and and has an ablation on the left. [Anterior accessory branch of the greater saphenous]. From what I remember they did not feel she had an arterial issue. The patient has had this area biopsied in 2009 at [facility] and by her recollection they said this was "stasis". She is also follow-up with dermatology locally who thought that this was more of a vascular issue 3/11; using Hydrofera Blue. Aggressive debridement today. She does not have an arterial issue. We are using 3 layer compression although we may need to go to 4. The patient has been in for multiple changes to her wrap since I last saw her a week ago. She says that the area was leaking. I do not have too much more information on what was found 01/19/19 on evaluation today patient was  actually being seen for a nurse visit when unfortunately she had the area on her left lateral lower extremity as well as weeping from the right lower extremity that became apparent. Therefore we did end up actually seeing her for a full visit with myself. She is having some pain at this site as well but fortunately nothing too significant at this point. No fevers, chills, nausea, or vomiting noted at this time. 3/18-Patient is back to the clinic with the left leg venous leg ulcer, the ulcer is larger in size, has a surface that is densely adherent with fibrinous tissue, the Hydrofera Blue was used but is densely adherent and there was difficulty in removing it. The right lower extremity was also wrapped for weeping edema. Patient has a new area over the left lateral foot above the malleolus that is small and appears to have no debris with intact surrounding skin. Patient is on increased dose of Lasix also as a means to edema management 3/25; the patient has a nonhealing venous ulcer on the medial left leg and last week  developed a smaller area on the lateral left calf. We have been using Hydrofera Blue with a contact layer. 4/1; no major change in these wounds areas. Left medial and more recently left lateral calf. I tried Iodoflex last week to aid in debridement she did not tolerate this. She stated her pain was terrible all week. She took the top layer of the 4 layer compression off. 4/8; the patient actually looks somewhat better in terms of her more prominent left lateral calf wound. There is some healthy looking tissue here. She is still complaining of a lot of discomfort. 4/15; patient in a lot of pain secondary to sciatica. She is on a prednisone taper prescribed by her primary physician. She has the 2 areas one on the left medial and more recently a smaller area on the left lateral calf. Both of these just above the malleoli 4/22; her back pain is better but she still states she is very uncomfortable and now feels she is intolerant to the [employer]. No real change in the wounds we have been using Sorbact. She has been previously intolerant to Iodoflex. There is not a lot of option about what we can use to debride this wound under compression that she no doubt needs. sHe states Ultram no longer works for her pain [name], [name] 4/29; no major change in the wounds slightly increased depth. Surface on the original medial wound perhaps somewhat improved however the more recent area on the lateral left ankle is 100% covered in very adherent debris we have been using Sorbact. She tolerates 4 layer compression well and her edema control is a lot better. She has not had to come in for a nurse check 5/6; no major change in the condition of the wounds. She did consent to debridement today which was done with some difficulty. Continuing Sorbact. She did not tolerate Iodoflex. She was in for a check of her compression the day after we wrapped her last week this was adjusted but nothing much was  found 5/13; no major change in the condition or area of the wounds. I was able to get a fairly aggressive debridement done on the lateral left leg wound. Even using Sorbact under compression. She came back in on Friday to have the wrap changed. She says she felt uncomfortable on the lateral aspect of her ankle. She has a long history of chronic venous insufficiency including  previous ablation surgery on this side. 5/20-Patient returns for wounds on left leg with both wounds covered in slough, with the lateral leg wound larger in size, she has been in 3 layer compression and felt more comfortable, she describes pain in ankle, in leg and pins and needles in foot, and is about to try Pamelor for this 6/3; wounds on the left lateral and left medial leg. The area medially which is the most recent of the 2 seems to have had the largest increase in dimensions. We have been using Sorbac to try and debride the surface. She has been to see orthopedics they apparently did a plain x-ray that was indeterminant. Diagnosed her with neuropathy and they have ordered an MRI to determine if there is underlying osteomyelitis. This was not high on my thought list but I suppose it is prudent. We have advised her to make an appointment with vein and vascular in [place]. She has a history of a left greater saphenous and accessory vein ablations I wonder if there is anything else that can be done from a surgical point of view to help in these difficult refractory wounds. We have previously healed this wound on one occasion but it keeps on reopening [medial side] 6/10; deep tissue culture I did last week I think on the left medial wound showed both moderate E. coli and moderate staph aureus [MSSA]. She is going to require antibiotics and I have chosen Augmentin. We have been using Sorbact and we have made better looking wound surface on both sides but certainly no improvement in wound area. She was back in last Friday  apparently for a dressing changes the wrap was hurting her outer left ankle. She has not managed to get a hold of vein and vascular in [place]. We are going to have to make her that appointment 6/17; patient is tolerating the Augmentin. She had an MRI that I think was ordered by orthopedic surgeon this did not show osteomyelitis or an abscess did suggest cellulitis. We have been using Sorbact to the lateral and medial ankles. We have been trying to arrange a follow-up appointment with vein and vascular in [place] or did her original ablations. We apparently an area sent the request to vein and vascular in [facility] 6/24; patient has completed the Augmentin. We do not yet have a vein and vascular appointment in [place]. I am not sure what the issue is here we have asked her to call tomorrow. We are using Sorbact. Making some improvements and especially the medial wound. Both surfaces however look better medial and lateral. 7/1; the patient has been in contact with vein and vascular in [place] but has not yet received an appointment. Using Sorbact we have gradually improve the wound surface with no improvement in surface area. She is approved for Apligraf but the wound surface still is not completely viable. She has not had to come in for a dressing change 7/8; the patient has an appointment with vein and vascular on 7/31 which is a Friday afternoon. She is concerned about getting back here for [place] to dress her wounds. I think it is important to have them goal for her venous reflux/history of ablations etc. to see if anything else can be done. She apparently tested positive for 1 of the blood tests with regards to lupus and saw a rheumatologist. He has raised the issue of vasculitis again. I have had this thought in the past however the evidence seems overwhelming that this is a  venous reflux etiology. If the rheumatologist tells me there is clinical and laboratory investigation is positive  for lupus I will rethink this. 7/15; the patient's wound surfaces are quite a bit better. The medial area which was her original wound now has no depth although the lateral wound which was the more recent area actually appears larger. Both with viable surfaces which is indeed better. Using Sorbact. I wanted to use Apligraf on her however there is the issue of the vein and vascular appointment on 7/31 at 2:00 in the afternoon which would not allow her to get back to be rewrapped and they would no doubt remove the graft 7/22; the patient's wound surfaces have moderate amount of debris although generally look better. The lateral one is larger with 2 small satellite areas superiorly. We are waiting for her vein and vascular appointment on 7/31. She has been approved for Apligraf which I would like to use after th 7/29; wound surfaces have improved no debridement is required we have been using Sorbact. She sees vein and vascular on Friday with this so question of whether anything can be done to lessen the likelihood of recurrence and/or speed the healing of these areas. She is already had previous ablations. She no doubt has severe venous hypertension 8/5-Patient returns at 1 week, she was in [place] for 3 days by her podiatrist, we have been using so backed to the wound, she has increased pain in both the wounds on the left lower leg especially the more distal one on the lateral aspect 8/12-Patient returns at 1 week and she is agreeable to having debridement in both wounds on her left leg today. We have been using Sorbact, and vascular studies were reviewed at last visit 8/19; the patient arrives with her wounds fairly clean and no debridement is required. We have used Sorbact which is really done a nice job in cleaning up these very difficult wound surfaces. The patient saw Dr. Donzetta Matters of vascular surgery on 7/31. He did not feel that there was an arterial component. He felt that her treated greater  saphenous vein is adequately addressed and that the small saphenous vein did not appear to be involved significantly. She was also noted to have deep venous reflux which is not treatable. Dr. Donzetta Matters mentioned the possibility of a central obstructive component leading to reflux and he offered her central venography. She wanted to discuss this or think about it. I have urged her to go ahead with this. She has had recurrent difficult wounds in these areas which do heal but after months in the clinic. If there is anything that can be done to reduce the likelihood of this I think it is worth it. 9/2 she is still working towards getting follow-up with Dr. Donzetta Matters to schedule her CT. Things are quite a bit worse venography. I put Apligraf on 2 weeks ago on both wounds on the medial and lateral part of her left lower leg. She arrives in clinic today with 3 superficial additional wounds above the area laterally and one below the wound medially. She describes a lot of discomfort. I think these are probably wrapped injuries. Does not look like she has cellulitis. 07/20/2019 on evaluation today patient appears to be doing somewhat poorly in regard to her lower extremity ulcers. She in fact showed signs of erythema in fact we may even be dealing with an infection at this time. Unfortunately I am unsure if this is just infection or if indeed there may  be some allergic reaction that occurred as a result of the Apligraf application. With that being said that would be unusual but nonetheless not impossible in this patient is one who is unfortunately allergic to quite a bit. Currently we have been using the Sorbact which seems to do as well as anything for her. I do think we may want to obtain a culture today to see if there is anything showing up there that may need to be addressed. 9/16; noted that last week the wounds look worse in 1 week follow-up of the Apligraf. Using Sorbact as of 2 days ago. She arrives with  copious amounts of drainage and new skin breakdown on the back of the left calf. The wounds arm more substantial bilaterally. There is a fair amount of swelling in the left calf no overt DVT there is edema present I think in the left greater than right thigh. She is supposed to go on 9/28 for CT OSHYN, METRO. ([id number]) venography. The wounds on the medial and lateral calf are worse and she has new skin breakdown posteriorly at least new for me. This is almost developing into a circumferential wound area The Apligraf was taken off last week which I agree with things are not going in the right direction a culture was done we do not have that back yet. She is on Augmentin that she started 2 days ago 9/23; dressing was changed by her nurses on Monday. In general there is no improvement in the wound areas although the area looks less angry than last week. She did get Augmentin for MSSA cultured on the 14th. She still appears to have too much swelling in the left leg even with 3 layer compression 9/30; the patient underwent her procedure on 9/28 by Dr. Donzetta Matters at [facility]. She was discovered to have the common iliac vein measuring 12.2 mm but at the level of L4-L5 measured 3 mm. After stenting it measured 10 mm. 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 Rouleaux flow in the common femoral and femoral vein was observed much improved after stenting. We are using silver alginate to the wounds on the medial and lateral ankle on the left. 4 layer compression 10/7; the patient had fluid swelling around her knee and 4 layer compression. At the advice of vein and vascular this was reduced to 3 layer which she is tolerating better. We have been using silver alginate under 3 layer compression since last Friday 10/14; arrives with the areas on the left ankle looking a lot better. Inflammation in the area also a lot better. She came in for a nurse check on 10/9 10/21; continued  nice improvement. Slight improvements in surface area of both the medial and lateral wounds on the left. A lot of the satellite lesions in the weeping erythema around these from stasis dermatitis is resolved. We have been using silver alginate 10/28; general improvement in the entire wound areas although not a lot of change in dimensions the wound certainly looks better. There is a lot less in terms of venous inflammation. Continue silver alginate this week however look towards Hydrofera Blue next week 11/4; very adherent debris on the medial wound left wound is not as bad. We have been using silver alginate. Change to [facility] today 11/11; very adherent debris on both wound areas. She went to vein and vascular last week and follow-up they put in Wilmar boot on this today. He says the [facility]  was adherent. Wound is definitely not as good as last week. Especially on the left there the satellite lesions look more prominent 11/18; absolutely no better. erythema on lateral aspect with tenderness. 09/30/2019 on evaluation today patient appears to actually be doing better. Dr. Dellia Nims did put her on doxycycline last week which I do believe has helped her at this point. Fortunately there is no signs of active infection at this time. No fevers, chills, nausea, vomiting, or diarrhea. I do believe he may want extend the doxycycline for 7 additional days just to ensure everything does completely cleared up the patient is in agreement with that plan. Otherwise she is going require some sharp debridement today 12/2; patient is completing a 2-week course of doxycycline. I gave her this empirically for inflammation as well as infection when I last saw her 2 weeks ago. All of this seems to be better. She is using silver alginate she has the area on the medial aspect of the larger area laterally and the 2 small satellite regions laterally above the major wound. 12/9; the patient's wound on the left medial  and left lateral calf look really quite good. We have been using silver alginate. She saw vein and vascular in follow-up on 10/09/2019. She has had a previous left greater saphenous vein ablation by Dr. Oscar La in 2016. More recently she underwent a left common iliac vein stent by Dr. 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 The swelling is improved and certainly the wounds have improved. The patient shows [place] today area on the right medial calf there is almost no wound but leaking lymphedema. She says she start this started 3 or 4 days ago. She did not traumatize it. It is not painful. She does not wear compression on that side 12/16; the patient continues to do well laterally. Medially still requiring debridement. The area on the right calf did not materialize to anything and is not currently open. We wrapped this last time. She has support stockings for that leg although I am not sure they are going to provide adequate compression 12/23; the lateral wound looks stable. Medially still requiring debridement for tightly adherent fibrinous debris. We've been using silver alginate. Surface area not any different 12/30; neither wound is any better with regards to surface and the area on the left lateral is larger. I been using silver alginate to the left lateral which look quite good last week and Sorbact to the left medial 11/11/2019. Lateral wound area actually looks better and somewhat smaller. Medial still requires a very aggressive debridement today. We have been using Sorbact on both wound areas 1/13; not much better still adherent debris bilaterally. I been using Sorbact. She has severe venous hypertension. Probably some degree of dermal fibrosis distally. I wonder whether tighter compression might help and I am going to try that today. We also need to work on the bioburden 1/20; using Sorbact. She has severe venous hypertension status post stent placement for pelvic vein compression. We  applied gentamicin last time to see if we could reduce bioburden I had some discussion with her today about the use of pentoxifylline. This is occasionally used in this setting for wounds with refractory venous insufficiency. However this interacts with Plavix. She tells me that she was put on this after stent placement for 3 months. She will call Dr. Claretha Cooper office to discuss 1/27; we are using gentamicin under Sorbact. 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 She has a stent.  Wound medially is measuring smaller this week. Laterally measuring slightly larger although she has some satellite lesions superiorly 2/3; gentamicin under Sorbact under 4-layer compression. 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 She has a stent on Plavix. Her wounds are measuring smaller this week. More substantially laterally where there is a satellite lesion superiorly. 2/10; gentamicin under Sorbac. 4-layer compression. Patient communicated with Dr. Donzetta Matters at vein and vascular in [place]. He is okay with the patient coming off Plavix I will therefore start her on pentoxifylline for a 1 month trial. In general her wounds look better today. I had some concerns about swelling in the left thigh however she measures 61.5 on the right and 63 on the mid thigh which does not suggest there is any difficulty. The patient is not describing any pain. 2/17; gentamicin under Sorbac 4-layer compression. She has been on pentoxifylline for 1 week and complains of loose stool. No nausea she is eating and drinking well 2/24; the patient apparently came in 2 days ago for a nurse visit when her wrap fell down. Both areas look a little worse this week macerated medially and satellite lesions laterally. Change to silver alginate today 3/3; wounds are larger today especially medially. She also has more swelling in her foot lower leg and I even noted some swelling in her posterior  thigh which is tender. I wonder about the patency of her stent. Fortuitously she sees Dr. Claretha Cooper group on Friday 3/10; Mrs. Olivares was seen by vein and vascular on 3/5. The patient underwent ultrasound. There was no evidence of thrombosis involving the IVC no evidence of thrombosis involving the right common iliac vein there is no evidence of thrombosis involving the right external iliac vein the left external Protzman, Etoile J. ([id number]) vein is also patent. The right common iliac vein stent appears patent bilateral common femoral veins are compressible and appear patent. I was concerned about the left common iliac stent however it looks like this is functional. She has some edema in the posterior thigh that was tender she still has that this week. I also note they had trouble finding the pulses in her left foot and booked her for an ABI baseline in 4 weeks. She will follow up in 6 months for repeat IVC duplex. The patient stopped the pentoxifylline because of diarrhea. It does not look like that was being effective in any case. I have advised her to go back on her aspirin 81 mg tablet, vascular it also suggested this 3/17; comes in today with her wound surfaces a lot better. The excoriations from last week considerably better probably secondary to the TCA. We have been using silver alginate 3/24; comes in today with smaller wounds both medially and laterally. Both required debridement. There are 2 small satellite areas superiorly laterally. She also has a very odd bandlike area in the mid calf almost looking like there was a weakness in the wrap in a localized area. I would write this off as being this however anteriorly she has a small raised ballotable area that is very tender almost reminiscent of an abscess but there was no obvious purulent surface to it. 02/04/20 upon evaluation today patient appears to be doing fairly well in regard to her wounds today. Fortunately there is no signs of active  infection at this time. No fevers, chills, nausea, vomiting, or diarrhea. She has been tolerating the dressing changes without complication. Fortunately I feel like she is showing signs of improvement although has been  sometime since have seen her. Nonetheless the area of concern that Dr. Dellia Nims had last week where she had possibly an area of the wrap that was we can allow the leg to bulge appears to be doing significantly better today there is no signs of anything worsening. 4/7; the patient's wounds on her medial and lateral left leg continue to contract. We have been using a regular alginate. Last week she developed an area on the right medial lower leg which is probably a venous ulcer as well. Electronic Signature(s) Signed: 02/10/2020 4:54:03 PM By: Linton Ham MD Entered By: Linton Ham on 02/10/2020 14:13:24 [name] -------------------------------------------------------------------------------- Physical Exam Details Patient Name: [name], [name]. Date of Service: 02/10/2020 1:15 PM Medical Record Number: [id number] Patient Account Number: [id number] Date of Birth/Sex: [birth year] (74 y.o. F) Treating RN: Cornell Barman Primary Care Provider: [name] Other Clinician: Referring Provider: [name] Treating Provider/Extender: Tito Dine in Treatment: 61 Notes Wound exam; both are areas on the left medial and left lateral leg looks very healthy there is smaller. Surfaces look better. Edema control is excellent. oOn the right medial there was a wound area that we define last week. Debris over the surface removed with a #5 curette some nonviable subcutaneous tissue minimal bleeding controlled with direct pressure I suspect this is a venous wound as well Electronic Signature(s) Signed: 02/10/2020 4:54:03 PM By: Linton Ham MD Entered By: Linton Ham on 02/10/2020 14:14:25 [name], [name]  ([id number]) -------------------------------------------------------------------------------- Physician Orders Details Patient Name: SANDE, KEATS. Date of Service: 02/10/2020 1:15 PM Medical Record Number: [id number] Patient Account Number: [id number] Date of Birth/Sex: 1946-04-18 (74 y.o. F) Treating RN: Cornell Barman Primary Care Provider: [name] Other Clinician: Referring Provider: [name] Treating Provider/Extender: Tito Dine in Treatment: 71 Verbal / Phone Orders: No Diagnosis Coding Anesthetic (add to Medication List) Wound #10 Left,Proximal,Lateral Lower Leg o Topical Lidocaine 4% cream applied to wound bed prior to debridement (In Clinic Only). Wound #11 Right,Medial Lower Leg o Topical Lidocaine 4% cream applied to wound bed prior to debridement (In Clinic Only). Wound #5 Left,Medial Lower Leg o Topical Lidocaine 4% cream applied to wound bed prior to debridement (In Clinic Only). Wound #6 Left,Lateral Lower Leg o Topical Lidocaine 4% cream applied to wound bed prior to debridement (In Clinic Only). Wound #9 Left,Lateral,Posterior Lower Leg o Topical Lidocaine 4% cream applied to wound bed prior to debridement (In Clinic Only). Skin Barriers/Peri-Wound Care Wound #10 Left,Proximal,Lateral Lower Leg o Triamcinolone Acetonide Ointment (TCA) - peri-wound Wound #11 Right,Medial Lower Leg o Triamcinolone Acetonide Ointment (TCA) - peri-wound Wound #5 Left,Medial Lower Leg o Triamcinolone Acetonide Ointment (TCA) - peri-wound Wound #6 Left,Lateral Lower Leg o Triamcinolone Acetonide Ointment (TCA) - peri-wound Wound #9 Left,Lateral,Posterior Lower Leg o Triamcinolone Acetonide Ointment (TCA) - peri-wound Primary Wound Dressing Wound #10 Left,Proximal,Lateral Lower Leg o Alginate Wound #11 Right,Medial Lower Leg o Alginate Wound #5 Left,Medial Lower Leg o Alginate Wound #6 Left,Lateral Lower Leg o Alginate Wound  #9 Left,Lateral,Posterior Lower Leg o Alginate Secondary Dressing Wound #10 Left,Proximal,Lateral Lower Leg o ABD pad Wound #11 Right,Medial Lower Leg o ABD pad [name], [name]. ([id number]) Wound #5 Left,Medial Lower Leg o ABD pad Wound #6 Left,Lateral Lower Leg o ABD pad Wound #9 Left,Lateral,Posterior Lower Leg o ABD pad Dressing Change Frequency Wound #10 Left,Proximal,Lateral Lower Leg o Change dressing every week o Other: - Nurse visit Friday and Monday if needed. Wound #11 Right,Medial Lower Leg o Change dressing  every week o Other: - Nurse visit Friday and Monday if needed. Wound #5 Left,Medial Lower Leg o Change dressing every week o Other: - Nurse visit Friday and Monday if needed. Wound #6 Left,Lateral Lower Leg o Change dressing every week o Other: - Nurse visit Friday and Monday if needed. Wound #9 Left,Lateral,Posterior Lower Leg o Change dressing every week o Other: - Nurse visit Friday and Monday if needed. Follow-up Appointments Wound #10 Left,Proximal,Lateral Lower Leg o Return Appointment in 1 week. o Nurse Visit as needed Wound #11 Right,Medial Lower Leg o Return Appointment in 1 week. o Nurse Visit as needed Wound #5 Left,Medial Lower Leg o Return Appointment in 1 week. o Nurse Visit as needed Wound #6 Left,Lateral Lower Leg o Return Appointment in 1 week. o Nurse Visit as needed Wound #9 Left,Lateral,Posterior Lower Leg o Return Appointment in 1 week. o Nurse Visit as needed Edema Control Wound #10 Left,Proximal,Lateral Lower Leg o 4 Layer Compression System - Bilateral Wound #11 Right,Medial Lower Leg o 4 Layer Compression System - Bilateral Wound #5 Left,Medial Lower Leg o 4 Layer Compression System - Bilateral Wound #6 Left,Lateral Lower Leg o 4 Layer Compression System - Bilateral Wound #9 Left,Lateral,Posterior Lower Leg o 4 Layer Compression System - Bilateral MELANIA, [name] Electronic Signature(s) Signed: 02/10/2020 4:54:03 PM By: Linton Ham MD Signed: 02/12/2020 10:29:18 AM By: [name], [name], RN, CWS, [name], [name] Entered By: [name], [name], RN, CWS, Kim on 02/10/2020 13:46:27 [name], [name] -------------------------------------------------------------------------------- Problem List Details Patient Name: [name], [name]. Date of Service: 02/10/2020 1:15 PM Medical Record Number: [id number] Patient Account Number: [id number] Date of Birth/Sex: 1946-08-02 (74 y.o. 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 to breakdown of skin 01/07/2019 No Yes I87.332 Chronic venous hypertension (idiopathic) with ulcer and inflammation of left 12/09/2019 No Yes lower extremity L97.211 Non-pressure chronic ulcer of right calf limited to breakdown of skin 02/10/2020 No Yes I89.0 Lymphedema, not elsewhere classified 12/10/2018 No Yes I82.592 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 Date of Service: 02/10/2020 1:15 PM Medical Record Number: [id number] Patient Account Number: [id number] Date of Birth/Sex: [birth year] (74  y.o. F) Treating RN: Cornell Barman Primary Care Provider: [name] Other Clinician: Referring Provider: [name] Treating Provider/Extender: Tito Dine in Treatment: 63 Subjective History of Present Illness (HPI) Pleasant 74 year old with history of chronic venous insufficiency. No diabetes or peripheral vascular disease. Left ABI 1.29. Questionable history of left lower extremity DVT. She developed a recurrent ulceration on her left lateral calf in December 2015, which she attributes to poor diet and subsequent lower extremity edema. She underwent endovenous laser ablation of her left greater saphenous vein in 2010. She underwent laser ablation of accessory branch of left GSV in April 2016 by Dr. Kellie Simmering at [facility]. She was previously wearing Unna boots, which she tolerated well. Tolerating 2 layer compression and cadexomer iodine.  She returns to clinic for follow-up and is without new complaints. She denies any significant pain at this time. She reports persistent pain with pressure. No claudication or ischemic rest pain. No fever or chills. No drainage. READMISSION 11/13/16; this is a 74 year old woman who is not a diabetic. She is here for a review of a painful area on her left medial lower extremity. I note that she was seen here previously last year for wound I believe to be in the same area. At that time she had undergone previously a left greater saphenous vein ablation by Dr. Kellie Simmering and she had a ablation of the anterior accessory branch of the left greater saphenous vein in March 2016. Seeing that the wound actually closed over. In reviewing the history with her today the ulcer in this area has been recurrent. She describes a biopsy of this area in 2009 that only showed stasis physiology. 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 Do not see that she has ever had arterial studies in the left leg. She tells me she has a follow-up with Dr. Kellie Simmering in roughly 10 days In any case she developed the reopening of this area roughly a month ago. On the background of this she describes rapidly increasing edema which has responded to Lasix 40 mg and metolazone 2.5 mg as well as the patient's lymph massage. She has been told she has both venous insufficiency and lymphedema but she cannot tolerate compression stockings 11/28/16; the patient saw Dr. Kellie Simmering recently. Per the patient he did arterial Dopplers in the office that did not show evidence of arterial insufficiency, per the patient he stated "treat this like an ordinary venous ulcer". She also saw her dermatologist Dr. Ronnald Ramp who felt that this was more of a vascular ulcer. In general things are improving although she arrives today with increasing bilateral lower extremity edema with weeping a deeper fluid through the wound on the left medial leg compatible with some degree of lymphedema 12/04/16; the patient's wound is fully epithelialized but I don't think fully healed. We will do another week of depression with Promogran and TCA however I suspect we'll be able to discharge her next week. This is a very unusual-looking wound which was initially a figure-of-eight type wound lying on its side surrounded by petechial like hemorrhage. She has had venous ablation on this side. She apparently does not have an arterial issue per Dr. Kellie Simmering. She saw her dermatologist thought it was "vascular". Patient is definitely going to need ongoing compression and I talked about this with her today she will go to elastic therapy after she leaves here next week 12/11/16; the patient's wound is not completely closed today. She has surrounding scar tissue and in further discussion with the patient it would appear that  she had ulcers in this area in 2009 for a prolonged period of time ultimately requiring a punch biopsy of this area that only showed venous insufficiency. I did not previously pickup on this part of the history from the patient. 12/18/16; the patient's wound is completely epithelialized. There is no open area here. She has significant bilateral venous insufficiency with secondary lymphedema to a mild-to-moderate degree she does not have compression stockings.. She did not say anything to me when I was  in the room, she told our intake nurse that she was still having pain in this area. This isn't unusual recurrent small open area. She is going to go to elastic therapy to obtain compression stockings. 12/25/16; the patient's wound is fully epithelialized. There is no open area here. The patient describes some continued episodic discomfort in this area medial left calf. However everything looks fine and healed here. She is been to elastic therapy and caught herself 15-20 mmHg stockings, they apparently were having trouble getting 20-30 mm stockings in her size 01/22/17; this is a patient we discharged from the clinic a month ago. She has a recurrent open wound on her medial left calf. She had 15 mm support stockings. I told her I thought she needed 20-30 mm compression stockings. She tells me that she has been ill with hospitalization secondary to asthma and is been found to have severe hypokalemia likely secondary to a combination of Lasix and metolazone. This morning she noted blistering and leaking fluid on the posterior part of her left leg. She called our intake nurse urgently and we was saw her this afternoon. She has not had any real discomfort here. I don't know that she's been wearing any stockings on this leg for at least 2-3 days. ABIs in this clinic were 1.21 on the right and 1.3 on the left. She is previously seen vascular surgery who does not think that there is a peripheral arterial  issue. 01/30/17; Patient arrives with no open wound on the left leg. She has been to elastic therapy and obtained 20-81mmhg below knee stockings and she has one on the right leg today. READMISSION 02/19/18; this Diiorio is a now 74 year old patient we've had in this clinic perhaps 3 times before. I had last looked at her from January 07 December 2016 with an area on the medial left leg. We discharged her on 12/25/16 however she had to be readmitted on 01/22/17 with a recurrence. I have in my notes that we discharged her on 20-30 mm stockings although she tells me she was only wearing support hose because she cannot get stockings on predominantly related to her cervical spine surgery/issues. She has had previous ablations done by vein and vascular in [place] including a great saphenous vein ablation on the left with an anterior accessory branch ablation I think both of these were in 2016. On one of the previous visit she had a biopsy noted 2009 that was negative. She is not felt to have an arterial issue. She is not a diabetic. She does have a history of obstructive sleep apnea hypertension asthma as well as chronic venous insufficiency and lymphedema. [name], [name] On this occasion she noted 2 dry scaly patch on her left leg. She tried to put lotion on this it didn't really help. There were 2 open areas.the patient has been seeing her primary physician from 02/05/18 through 02/14/18. She had Unna boots applied. The superior wound now on the lateral left leg has closed but she's had one wound that remains open on the lateral left leg. This is not the same spot as we dealt with in 2018. ABIs in this clinic were 1.3 bilaterally 02/26/18; patient has a small wound on the left lateral calf. Dimensions are down. She has chronic venous insufficiency and lymphedema. 03/05/18; small open area on the left lateral calf. Dimensions are down. Tightly adherent necrotic debris over the surface of the wound  which was difficult to remove. Also the dressing [over collagen]  stuck to the wound surface. This was removed with some difficulty as well. Change the primary dressing to Hydrofera Blue ready 03/12/18; small open area on the left lateral calf. Comes in with tightly adherent surface eschar as well as some adherent Hydrofera Blue. 03/19/18; open area on the left lateral calf. Again adherent surface eschar as well as some adherent Hydrofera Blue nonviable subcutaneous tissue. She complained of pain all week even with the reduction from 4-3 layer compression I put on last week. Also she had an increase in her ankle and calf measurements probably related to the same thing. 03/26/18; open area on the left lateral calf. A very small open area remains here. We used silver alginate starting last week as the Hydrofera Blue seem to stick to the wound bed. In using 4-layer compression 04/02/18; the open area in the left lateral calf at some adherent slough which I removed there is no open area here. We are able to transition her into her own compression stocking. Truthfully I think this is probably his support hose. However this does not maintain skin integrity will be limited. She cannot put over the toe compression stockings on because of neck problems hand problems etc. She is allergic to the lining layer of juxta lites. We might be forced to use extremitease stocking should this fail READMIT 11/24/2018 Patient is now a 74 year old woman who is not a diabetic. She has been in this clinic on at least 3 previous occasions largely with recurrent wounds on her left leg secondary to chronic venous insufficiency with secondary lymphedema. Her situation is complicated by inability to get stockings on and an allergy to neoprene which is apparently a component and at least juxta lites and other stockings. As a result she really has not been wearing any stockings on her legs. She tells [place] that roughly 2 or 3 weeks ago she  started noticing a stinging sensation just above her ankle on the left medial aspect. She has been diagnosed with pseudogout and she wondered whether this was what she was experiencing. She tried to dress this with something she bought at the store however subsequently it pulled skin off and now she has an open wound that is not improving. She has been using Vaseline gauze with a cover bandage. She saw her primary doctor last week who put an Haematologist on her. ABIs in this clinic was 1.03 on the left 2/12; the area is on the left medial ankle. Odd-looking wound with what looks to be surface epithelialization but a multitude of small petechial openings. This clearly not closed yet. We have been using silver alginate under 3 layer compression with TCA 2/19; the wound area did not look quite as good this week. Necrotic debris over the majority of the wound surface which required debridement. She continues to have a multitude of what looked to be small petechial openings. She reminds [place] that she had a biopsy on this initially during her first outbreak in 2015 in [place] [facility]. She expresses concern about this being a possible melanoma. 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 I assured her that this does not look anything like melanoma. Except for the petechial reaction it does look like a venous insufficiency area and she certainly has evidence of this on both sides 2/26; a difficult area on the left medial ankle. The patient clearly has chronic venous hypertension with some degree of lymphedema.  The odd thing about the area is the small petechial hemorrhages. I am not really sure how to explain this. This was present last time and this is not a compression injury. We have been using Hydrofera Blue which I changed to last week 3/4; still using Hydrofera Blue. Aggressive debridement today. She does not have  known arterial issues. She has seen Dr. Kellie Simmering at [facility] vein [facility] vascular and and has an ablation on the left. [Anterior accessory branch of the greater saphenous]. From what I remember they did not feel she had an arterial issue. The patient has had this area biopsied in 2009 at [facility] dermatology and by her recollection they said this was "stasis". She is also follow-up with dermatology locally who thought that this was more of a vascular issue 3/11; using Hydrofera Blue. Aggressive debridement today. She does not have an arterial issue. We are using 3 layer compression although we may need to go to 4. The patient has been in for multiple changes to her wrap since I last saw her a week ago. She says that the area was leaking. I do not have too much more information on what was found 01/19/19 on evaluation today patient was actually being seen for a nurse visit when unfortunately she had the area on her left lateral lower extremity as well as weeping from the right lower extremity that became apparent. Therefore we did end up actually seeing her for a full visit with myself. She is having some pain at this site as well but fortunately nothing too significant at this point. No fevers, chills, nausea, or vomiting noted at this time. 3/18-Patient is back to the clinic with the left leg venous leg ulcer, the ulcer is larger in size, has a surface that is densely adherent with fibrinous tissue, the Hydrofera Blue was used but is densely adherent and there was difficulty in removing it. The right lower extremity was also wrapped for weeping edema. Patient has a new area over the left lateral foot above the malleolus that is small and appears to have no debris with intact surrounding skin. Patient is on increased dose of Lasix also as a means to edema management 3/25; the patient has a nonhealing venous ulcer on the medial left leg and last week developed a smaller area on the lateral left calf. We  have been using Hydrofera Blue with a contact layer. 4/1; no major change in these wounds areas. Left medial and more recently left lateral calf. I tried Iodoflex last week to aid in debridement she did not tolerate this. She stated her pain was terrible all week. She took the top layer of the 4 layer compression off. 4/8; the patient actually looks somewhat better in terms of her more prominent left lateral calf wound. There is some healthy looking tissue here. She is still complaining of a lot of discomfort. 4/15; patient in a lot of pain secondary to sciatica. She is on a prednisone taper prescribed by her primary physician. She has the 2 areas one on the left medial and more recently a smaller area on the left lateral calf. Both of these just above the malleoli 4/22; her back pain is better but she still states she is very uncomfortable and now feels she is intolerant to the [employer]. No real change in the wounds we have been using Sorbact. She has been previously intolerant to Iodoflex. There is not a lot of option about what we can use to debride this  wound under compression that she no doubt needs. sHe states Ultram no longer works for her pain [name], FINNIN ([id number]) 4/29; no major change in the wounds slightly increased depth. Surface on the original medial wound perhaps somewhat improved however the more recent area on the lateral left ankle is 100% covered in very adherent debris we have been using Sorbact. She tolerates 4 layer compression well and her edema control is a lot better. She has not had to come in for a nurse check 5/6; no major change in the condition of the wounds. She did consent to debridement today which was done with some difficulty. Continuing Sorbact. She did not tolerate Iodoflex. She was in for a check of her compression the day after we wrapped her last week this was adjusted but nothing much was found 5/13; no major change in the condition or area of the  wounds. I was able to get a fairly aggressive debridement done on the lateral left leg wound. Even using Sorbact under compression. She came back in on Friday to have the wrap changed. She says she felt uncomfortable on the lateral aspect of her ankle. She has a long history of chronic venous insufficiency including previous ablation surgery on this side. 5/20-Patient returns for wounds on left leg with both wounds covered in slough, with the lateral leg wound larger in size, she has been in 3 layer compression and felt more comfortable, she describes pain in ankle, in leg and pins and needles in foot, and is about to try Pamelor for this 6/3; wounds on the left lateral and left medial leg. The area medially which is the most recent of the 2 seems to have had the largest increase in dimensions. We have been using Sorbac to try and debride the surface. She has been to see orthopedics they apparently did a plain x-ray that was indeterminant. Diagnosed her with neuropathy and they have ordered an MRI to determine if there is underlying osteomyelitis. This was not high on my thought list but I suppose it is prudent. We have advised her to make an appointment with vein and vascular in [place]. She has a history of a left greater saphenous and accessory vein ablations I wonder if there is anything else that can be done from a surgical point of view to help in these difficult refractory wounds. We have previously healed this wound on one occasion but it keeps on reopening [medial side] 6/10; deep tissue culture I did last week I think on the left medial wound showed both moderate E. coli and moderate staph aureus [MSSA]. She is going to require antibiotics and I have chosen Augmentin. We have been using Sorbact and we have made better looking wound surface on both sides but certainly no improvement in wound area. She was back in last Friday apparently for a dressing changes the wrap was hurting her outer  left ankle. She has not managed to get a hold of vein and vascular in [place]. We are going to have to make her that appointment 6/17; patient is tolerating the Augmentin. She had an MRI that I think was ordered by orthopedic surgeon this did not show osteomyelitis or an abscess did suggest cellulitis. We have been using Sorbact to the lateral and medial ankles. We have been trying to arrange a follow-up appointment with vein and vascular in [place] or did her original ablations. We apparently an area sent the request to vein and vascular in [facility] 6/24; patient has  completed the Augmentin. We do not yet have a vein and vascular appointment in [place]. I am not sure what the issue is here we have asked her to call tomorrow. We are using Sorbact. Making some improvements and especially the medial wound. Both surfaces however look better medial and lateral. 7/1; the patient has been in contact with vein and vascular in [place] but has not yet received an appointment. Using Sorbact we have gradually improve the wound surface with no improvement in surface area. She is approved for Apligraf but the wound surface still is not completely viable. She has not had to come in for a dressing change 7/8; the patient has an appointment with vein and vascular on 7/31 which is a Friday afternoon. She is concerned about getting back here for [place] to dress her wounds. I think it is important to have them goal for her venous reflux/history of ablations etc. to see if anything else can be done. She apparently tested positive for 1 of the blood tests with regards to lupus and saw a rheumatologist. He has raised the issue of vasculitis again. I have had this thought in the past however the evidence seems overwhelming that this is a venous reflux etiology. If the rheumatologist tells me there is clinical and laboratory investigation is positive for lupus I will rethink this. 7/15; the patient's wound  surfaces are quite a bit better. The medial area which was her original wound now has no depth although the lateral wound which was the more recent area actually appears larger. Both with viable surfaces which is indeed better. Using Sorbact. I wanted to use Apligraf on her however there is the issue of the vein and vascular appointment on 7/31 at 2:00 in the afternoon which would not allow her to get back to be rewrapped and they would no doubt remove the graft 7/22; the patient's wound surfaces have moderate amount of debris although generally look better. The lateral one is larger with 2 small satellite areas superiorly. We are waiting for her vein and vascular appointment on 7/31. She has been approved for Apligraf which I would like to use after th 7/29; wound surfaces have improved no debridement is required we have been using Sorbact. She sees vein and vascular on Friday with this so question of whether anything can be done to lessen the likelihood of recurrence and/or speed the healing of these areas. She is already had previous ablations. She no doubt has severe venous hypertension 8/5-Patient returns at 1 week, she was in [place] for 3 days by her podiatrist, we have been using so backed to the wound, she has increased pain in both the wounds on the left lower leg especially the more distal one on the lateral aspect 8/12-Patient returns at 1 week and she is agreeable to having debridement in both wounds on her left leg today. We have been using Sorbact, and vascular studies were reviewed at last visit 8/19; the patient arrives with her wounds fairly clean and no debridement is required. We have used Sorbact which is really done a nice job in cleaning up these very difficult wound surfaces. The patient saw Dr. Donzetta Matters of vascular surgery on 7/31. He did not feel that there was an arterial component. He felt that her treated greater saphenous vein is adequately addressed and that the small  saphenous vein did not appear to be involved significantly. She was also noted to have deep venous reflux which is not treatable. Dr. Donzetta Matters  mentioned the possibility of a central obstructive component leading to reflux and he offered her central venography. She wanted to discuss this or think about it. I have urged her to go ahead with this. She has had recurrent difficult wounds in these areas which do heal but after months in the clinic. If there is anything that can be done to reduce the likelihood of this I think it is worth it. 9/2 she is still working towards getting follow-up with Dr. Donzetta Matters to schedule her CT. Things are quite a bit worse venography. I put Apligraf on 2 weeks ago on both wounds on the medial and lateral part of her left lower leg. She arrives in clinic today with 3 superficial additional wounds above the area laterally and one below the wound medially. She describes a lot of discomfort. I think these are probably wrapped injuries. Does not look like she has cellulitis. 07/20/2019 on evaluation today patient appears to be doing somewhat poorly in regard to her lower extremity ulcers. She in fact showed signs of erythema in fact we may even be dealing with an infection at this time. Unfortunately I am unsure if this is just infection or if indeed there may be some allergic reaction that occurred as a result of the Apligraf application. With that being said that would be unusual but nonetheless not impossible in this patient is one who is unfortunately allergic to quite a bit. Currently we have been using the Sorbact which seems to do as well as anything for her. I do think we may want to obtain a culture today to see if there is anything showing up there that may need to be addressed. 9/16; noted that last week the wounds look worse in 1 week follow-up of the Apligraf. Using Sorbact as of 2 days ago. She arrives with copious amounts of drainage and new skin breakdown on the back of  the left calf. The wounds arm more substantial bilaterally. There is a fair amount of swelling in the left calf no overt DVT there is edema present I think in the left greater than right thigh. She is supposed to go on 9/28 for CT ITZEL, KONICKI. ([id number]) venography. The wounds on the medial and lateral calf are worse and she has new skin breakdown posteriorly at least new for me. This is almost developing into a circumferential wound area The Apligraf was taken off last week which I agree with things are not going in the right direction a culture was done we do not have that back yet. She is on Augmentin that she started 2 days ago 9/23; dressing was changed by her nurses on Monday. In general there is no improvement in the wound areas although the area looks less angry than last week. She did get Augmentin for MSSA cultured on the 14th. She still appears to have too much swelling in the left leg even with 3 layer compression 9/30; the patient underwent her procedure on 9/28 by Dr. Donzetta Matters at vascular and vein specialist. She was discovered to have the common iliac vein measuring 12.2 mm but at the level of L4-L5 measured 3 mm. After stenting it measured 10 mm. 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 Rouleaux flow in the common femoral and femoral vein was observed much improved after stenting. We are using silver alginate to the wounds on the medial and lateral ankle on the left. 4 layer compression 10/7; the patient had fluid swelling around her  knee and 4 layer compression. At the advice of vein and vascular this was reduced to 3 layer which she is tolerating better. We have been using silver alginate under 3 layer compression since last Friday 10/14; arrives with the areas on the left ankle looking a lot better. Inflammation in the area also a lot better. She came in for a nurse check on 10/9 10/21; continued nice improvement. Slight improvements in surface area of both the  medial and lateral wounds on the left. A lot of the satellite lesions in the weeping erythema around these from stasis dermatitis is resolved. We have been using silver alginate 10/28; general improvement in the entire wound areas although not a lot of change in dimensions the wound certainly looks better. There is a lot less in terms of venous inflammation. Continue silver alginate this week however look towards Hydrofera Blue next week 11/4; very adherent debris on the medial wound left wound is not as bad. We have been using silver alginate. Change to [facility] today 11/11; very adherent debris on both wound areas. She went to vein and vascular last week and follow-up they put in Farley boot on this today. He says the [facility] was adherent. Wound is definitely not as good as last week. Especially on the left there the satellite lesions look more prominent 11/18; absolutely no better. erythema on lateral aspect with tenderness. 09/30/2019 on evaluation today patient appears to actually be doing better. Dr. Dellia Nims did put her on doxycycline last week which I do believe has helped her at this point. Fortunately there is no signs of active infection at this time. No fevers, chills, nausea, vomiting, or diarrhea. I do believe he may want extend the doxycycline for 7 additional days just to ensure everything does completely cleared up the patient is in agreement with that plan. Otherwise she is going require some sharp debridement today 12/2; patient is completing a 2-week course of doxycycline. I gave her this empirically for inflammation as well as infection when I last saw her 2 weeks ago. All of this seems to be better. She is using silver alginate she has the area on the medial aspect of the larger area laterally and the 2 small satellite regions laterally above the major wound. 12/9; the patient's wound on the left medial and left lateral calf look really quite good. We have been using  silver alginate. She saw vein and vascular in follow-up on 10/09/2019. She has had a previous left greater saphenous vein ablation by Dr. Oscar La in 2016. More recently she underwent a left common iliac vein stent by Dr. 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 The swelling is improved and certainly the wounds have improved. The patient shows [place] today area on the right medial calf there is almost no wound but leaking lymphedema. She says she start this started 3 or 4 days ago. She did not traumatize it. It is not painful. She does not wear compression on that side 12/16; the patient continues to do well laterally. Medially still requiring debridement. The area on the right calf did not materialize to anything and is not currently open. We wrapped this last time. She has support stockings for that leg although I am not sure they are going to provide adequate compression 12/23; the lateral wound looks stable. Medially still requiring debridement for tightly adherent fibrinous debris. We've been using silver alginate. Surface area not any different 12/30; neither wound is any better with regards  to surface and the area on the left lateral is larger. I been using silver alginate to the left lateral which look quite good last week and Sorbact to the left medial 11/11/2019. Lateral wound area actually looks better and somewhat smaller. Medial still requires a very aggressive debridement today. We have been using Sorbact on both wound areas 1/13; not much better still adherent debris bilaterally. I been using Sorbact. She has severe venous hypertension. Probably some degree of dermal fibrosis distally. I wonder whether tighter compression might help and I am going to try that today. We also need to work on the bioburden 1/20; using Sorbact. She has severe venous hypertension status post stent placement for pelvic vein compression. We applied gentamicin last time to see if we could reduce bioburden I  had some discussion with her today about the use of pentoxifylline. This is occasionally used in this setting for wounds with refractory venous insufficiency. However this interacts with Plavix. She tells me that she was put on this after stent placement for 3 months. She will call Dr. Claretha Cooper office to discuss 1/27; we are using gentamicin under Sorbact. 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 She has a stent. Wound medially is measuring smaller this week. Laterally measuring slightly larger although she has some satellite lesions superiorly 2/3; gentamicin under Sorbact under 4-layer compression. 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 She has a stent on Plavix. Her wounds are measuring smaller this week. More substantially laterally where there is a satellite lesion superiorly. 2/10; gentamicin under Sorbac. 4-layer compression. Patient communicated with Dr. Donzetta Matters at [facility] in [place]. He is okay with the patient coming off Plavix I will therefore start her on pentoxifylline for a 1 month trial. In general her wounds look better today. I had some concerns about swelling in the left thigh however she measures 61.5 on the right and 63 on the mid thigh which does not suggest there is any difficulty. The patient is not describing any pain. 2/17; gentamicin under Sorbac 4-layer compression. She has been on pentoxifylline for 1 week and complains of loose stool. No nausea she is eating and drinking well 2/24; the patient apparently came in 2 days ago for a nurse visit when her wrap fell down. Both areas look a little worse this week macerated medially and satellite lesions laterally. Change to silver alginate today 3/3; wounds are larger today especially medially. She also has more swelling in her foot lower leg and I even noted some swelling in her posterior thigh which is tender. I wonder about the patency of her stent. Fortuitously  she sees Dr. Claretha Cooper group on Friday 3/10; Mrs. Marruffo was seen by [facility] on 3/5. The patient underwent ultrasound. There was no evidence of thrombosis involving the IVC no evidence of thrombosis involving the right common iliac vein there is no evidence of thrombosis involving the right external iliac vein the left external Krall, Mallary J. ([id number]) vein is also patent. The right common iliac vein stent appears patent bilateral common femoral veins are compressible and appear patent. I was concerned about the left common iliac stent however it looks like this is functional. She has some edema in the posterior thigh that was tender she still has that this week. I also note they had trouble finding the pulses in her left foot and booked her for an ABI baseline in 4 weeks. She will follow up in 6 months for repeat IVC duplex.  The patient stopped the pentoxifylline because of diarrhea. It does not look like that was being effective in any case. I have advised her to go back on her aspirin 81 mg tablet, vascular it also suggested this 3/17; comes in today with her wound surfaces a lot better. The excoriations from last week considerably better probably secondary to the TCA. We have been using silver alginate 3/24; comes in today with smaller wounds both medially and laterally. Both required debridement. There are 2 small satellite areas superiorly laterally. She also has a very odd bandlike area in the mid calf almost looking like there was a weakness in the wrap in a localized area. I would write this off as being this however anteriorly she has a small raised ballotable area that is very tender almost reminiscent of an abscess but there was no obvious purulent surface to it. 02/04/20 upon evaluation today patient appears to be doing fairly well in regard to her wounds today. Fortunately there is no signs of active infection at this time. No fevers, chills, nausea, vomiting, or diarrhea.  She has been tolerating the dressing changes without complication. Fortunately I feel like she is showing signs of improvement although has been sometime since have seen her. Nonetheless the area of concern that Dr. Dellia Nims had last week where she had possibly an area of the wrap that was we can allow the leg to bulge appears to be doing significantly better today there is no signs of anything worsening. 4/7; the patient's wounds on her medial and lateral left leg continue to contract. We have been using a regular alginate. Last week she developed an area on the right medial lower leg which is probably a venous ulcer as well. Objective Constitutional Vitals Time Taken: 1:10 PM, Height: 63 in, Weight: 224.7 lbs, BMI: 39.8, Temperature: 98.7 F, Pulse: 83 bpm, Respiratory Rate: 18 breaths/min, Blood Pressure: 137/69 mmHg. Integumentary (Hair, Skin) Wound #10 status is Open. Original cause of wound was Blister. The wound is located on the Left,Proximal,Lateral Lower Leg. The wound measures 0.1cm length x 0.1cm width x 0.1cm depth; 0.008cm^2 area and 0.001cm^3 volume. There is Fat Layer (Subcutaneous Tissue) Exposed exposed. There is a medium amount of serosanguineous drainage noted. There is large (67-100%) pink granulation within the wound bed. There is a small (1-33%) amount of necrotic tissue within the wound bed including Adherent Slough. Wound #11 status is Open. Original cause of wound was Gradually Appeared. The wound is located on the Right,Medial Lower Leg. The wound measures 1cm length x 0.7cm width x 0.1cm depth; 0.55cm^2 area and 0.055cm^3 volume. There is Fat Layer (Subcutaneous Tissue) Exposed exposed. There is a medium amount of serosanguineous drainage noted. There is medium (34-66%) pink granulation within the wound bed. There is a medium (34-66%) amount of necrotic tissue within the wound bed including Adherent Slough. Wound #5 status is Open. Original cause of wound was Gradually  Appeared. The wound is located on the Left,Medial Lower Leg. The wound measures 1.1cm length x 0.6cm width x 0.1cm depth; 0.518cm^2 area and 0.052cm^3 volume. There is Fat Layer (Subcutaneous Tissue) Exposed exposed. There is a medium amount of serosanguineous drainage noted. There is large (67-100%) pink granulation within the wound bed. There is a small (1-33%) amount of necrotic tissue within the wound bed including Adherent Slough. Wound #6 status is Open. Original cause of wound was Gradually Appeared. The wound is located on the Left,Lateral Lower Leg. The wound measures 1.2cm length x 1cm width x 0.1cm  depth; 0.942cm^2 area and 0.094cm^3 volume. There is Fat Layer (Subcutaneous Tissue) Exposed exposed. There is a medium amount of serosanguineous drainage noted. The wound margin is flat and intact. There is large (67-100%) pink granulation within the wound bed. There is a small (1-33%) amount of necrotic tissue within the wound bed including Adherent Slough. Wound #9 status is Open. Original cause of wound was Gradually Appeared. The wound is located on the Left,Lateral,Posterior Lower Leg. The wound measures 0.2cm length x 0.2cm width x 0.1cm depth; 0.031cm^2 area and 0.003cm^3 volume. There is Fat Layer (Subcutaneous Tissue) Exposed exposed. There is a medium amount of serosanguineous drainage noted. There is large (67-100%) pink granulation within the wound bed. There is a small (1-33%) amount of necrotic tissue within the wound bed including Adherent Slough. 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 There was a Excisional Skin/Subcutaneous Tissue Debridement with a total area of 0.7 sq cm performed by [name] Dillon, MD. With the following instrument(s): Curette to remove Viable and Non-Viable tissue/material. Material removed includes Subcutaneous Tissue, Skin: Dermis, and Fibrin/Exudate after achieving pain control using Lidocaine. No specimens were taken. A time out was conducted at 13:41, prior to the start of the procedure. A Minimum amount of bleeding was controlled with N/A. The procedure was tolerated well. Post Debridement Measurements: 1cm length x 0.7cm width x 0.1cm depth; 0.055cm^3 volume. Character of Wound/Ulcer Post Debridement is stable. Severity of Tissue Post Debridement is: Fat layer exposed. Post procedure Diagnosis Wound #11: Same as Pre-Procedure Pre-procedure diagnosis of Wound #11 is a Venous Leg Ulcer located on the Right,Medial Lower Leg . There was a Four Layer Compression Therapy Procedure with a pre-treatment ABI of 1 by Cornell Barman, RN. Post procedure Diagnosis Wound #11: Same as Pre-Procedure Wound #10 Pre-procedure diagnosis of Wound #10 is a Venous Leg Ulcer located on the Left,Proximal,Lateral Lower Leg . There was a Four Layer Compression Therapy Procedure with a pre-treatment ABI of 1 by Cornell Barman, RN. Post procedure Diagnosis Wound #10: Same as Pre-Procedure Wound #5 Pre-procedure diagnosis of Wound #5 is a Lymphedema located on the Left,Medial Lower Leg . There was a Four Layer Compression Therapy Procedure with a pre-treatment ABI of 1 by Cornell Barman, RN. Post procedure Diagnosis Wound #5: Same as Pre-Procedure Wound #6 Pre-procedure diagnosis of Wound #6 is a Venous Leg Ulcer located on the Left,Lateral Lower Leg . There was a Four Layer Compression Therapy Procedure with a  pre-treatment ABI of 1 by Cornell Barman, RN. Post procedure Diagnosis Wound #6: Same as Pre-Procedure Wound #9 Pre-procedure diagnosis of Wound #9 is a Venous Leg Ulcer located on the Left,Lateral,Posterior Lower Leg . There was a Four Layer Compression Therapy Procedure with a pre-treatment ABI of 1 by Cornell Barman, RN. Post procedure Diagnosis Wound #9: Same as Pre-Procedure Plan Anesthetic (add to Medication List): Wound #10 Left,Proximal,Lateral Lower Leg: Topical Lidocaine 4% cream applied to wound bed prior to debridement (In Clinic Only). Wound #11 Right,Medial Lower Leg: Topical Lidocaine 4% cream applied to  wound bed prior to debridement (In Clinic Only). NIAYA, KRIMMEL ([id number]) Wound #5 Left,Medial Lower Leg: Topical Lidocaine 4% cream applied to wound bed prior to debridement (In Clinic Only). Wound #6 Left,Lateral Lower Leg: Topical Lidocaine 4% cream applied to wound bed prior to debridement (In Clinic Only). Wound #9 Left,Lateral,Posterior Lower Leg: Topical Lidocaine 4% cream applied to wound bed prior to debridement (In Clinic Only). Skin Barriers/Peri-Wound Care: Wound #10 Left,Proximal,Lateral Lower Leg: Triamcinolone Acetonide Ointment (TCA) - peri-wound Wound #11 Right,Medial Lower Leg: Triamcinolone Acetonide Ointment (TCA) - peri-wound Wound #5 Left,Medial Lower Leg: Triamcinolone Acetonide Ointment (TCA) - peri-wound Wound #6 Left,Lateral Lower Leg: Triamcinolone Acetonide Ointment (TCA) - peri-wound Wound #9 Left,Lateral,Posterior Lower Leg: Triamcinolone Acetonide Ointment (TCA) - peri-wound Primary Wound Dressing: Wound #10 Left,Proximal,Lateral Lower Leg: Alginate Wound #11 Right,Medial Lower Leg: Alginate Wound #5 Left,Medial Lower Leg: Alginate Wound #6 Left,Lateral Lower Leg: Alginate Wound #9 Left,Lateral,Posterior Lower Leg: Alginate Secondary Dressing: Wound #10 Left,Proximal,Lateral Lower Leg: ABD pad Wound #11 Right,Medial Lower Leg: ABD  pad Wound #5 Left,Medial Lower Leg: ABD pad Wound #6 Left,Lateral Lower Leg: ABD pad Wound #9 Left,Lateral,Posterior Lower Leg: ABD pad Dressing Change Frequency: Wound #10 Left,Proximal,Lateral Lower Leg: Change dressing every week Other: - Nurse visit Friday and Monday if needed. Wound #11 Right,Medial Lower Leg: Change dressing every week Other: - Nurse visit Friday and Monday if needed. Wound #5 Left,Medial Lower Leg: Change dressing every week Other: - Nurse visit Friday and Monday if needed. Wound #6 Left,Lateral Lower Leg: Change dressing every week Other: - Nurse visit Friday and Monday if needed. Wound #9 Left,Lateral,Posterior Lower Leg: Change dressing every week Other: - Nurse visit Friday and Monday if needed. Follow-up Appointments: Wound #10 Left,Proximal,Lateral Lower Leg: Return Appointment in 1 week. Nurse Visit as needed Wound #11 Right,Medial Lower Leg: Return Appointment in 1 week. Nurse Visit as needed Wound #5 Left,Medial Lower Leg: Return Appointment in 1 week. Nurse Visit as needed Wound #6 Left,Lateral Lower Leg: Return Appointment in 1 week. Nurse Visit as needed Wound #9 Left,Lateral,Posterior Lower Leg: Return Appointment in 1 week. Nurse Visit as needed Edema Control: LILJA, JOST ([id number]) Wound #10 Left,Proximal,Lateral Lower Leg: 4 Layer Compression System - Bilateral Wound #11 Right,Medial Lower Leg: 4 Layer Compression System - Bilateral Wound #5 Left,Medial Lower Leg: 4 Layer Compression System - Bilateral Wound #6 Left,Lateral Lower Leg: 4 Layer Compression System - Bilateral Wound #9 Left,Lateral,Posterior Lower Leg: 4 Layer Compression System - Bilateral calcium alginate, ABDs and 4-layer compression bilaterally. we had some conversation about stockings Patient has chronic venous hypertension with stage 3 lymphedema. In spite of conservative treatment including leg elevation and exercise and our 4 layer compression we  have had great difficulty healing these areas. I suspect she would benifit from external compression pumps as well Electronic Signature(s) Signed: 02/10/2020 4:54:03 PM By: Linton Ham MD Entered By: Linton Ham on 02/10/2020 14:19:48 Abello, [name] -------------------------------------------------------------------------------- SuperBill Details Patient Name: ZAVANNA, CARDWELL. Date of Service: 02/10/2020 Medical Record Number: [id number] Patient Account Number: [id number] Date of Birth/Sex: 1946-02-01 (74 y.o. 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 Facility Procedures CPT4 Code: [id number] Description: [id number] - DEB SUBQ TISSUE 20 SQ CM/< Modifier: Quantity: 1 CPT4 Code: Description: ICD-10 Diagnosis Description [id number] Non-pressure chronic ulcer of right calf limited to breakdown of skin Modifier: Quantity: Physician Procedures CPT4 Code: [id number] Description: [id number] - WC PHYS SUBQ TISS 20 SQ CM Modifier: Quantity: 1 CPT4 Code: Description: ICD-10 Diagnosis Description [id number] Non-pressure chronic ulcer of right calf limited to breakdown of skin Modifier: Quantity: Electronic Signature(s) Signed: 02/10/2020 4:54:03 PM By: Linton Ham MD Entered By: Linton Ham on 02/10/2020 14:20:22

## 2020-02-12 NOTE — Progress Notes (Signed)
[facility] [place]   Reason for referral: Swollen left leg  History of Present Illness  [name] is a 74 y.o. female who presents for follow up.  She has hx of left greater saphenous vein ablation by Dr. Kellie Simmering in 2016. Most recently she underwent left common iliac vein stent by Dr. 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  At her last visit, she felt the swelling in her leg had drastically improved and her venous ulcerations had also improved.  She was elevating her legs as much as possible and continued some form of compression daily with wraps from the wound care center.              She was last seen on 01/08/2020 with f/u venous study that showed patent venous flow with patent iliac stent.   He wounds are much more superficial than previous pictures.   She states when she was started on Plavix she stopped her Asprin and now she has tried a new medication trental.  She has discontinued both Asprin and Plavix at this time.               She denise symptoms of claudication, rest pain or non healing wounds on her feet.  Past Medical History:  Diagnosis Date  . Allergy   . Anesthesia complication    trouble waking up 2/2 CPAP  . Arthritis   . Asthma in adult   . Cataract 2019   left eye resolved with surgery  . Cervical spondylosis 2012   Jacelyn Grip, [place])  . Choroidal nevus of left eye 03/22/2016  . Chronic venous insufficiency 2016   severe reflux with painful varicosities sees [facility]  . Clotting disorder ([place])    due to vein ablation   . Complication of anesthesia    difficulty coming out due to sleep apnea per pt  . Difficult intubation    PATIENT DENIES   . Diverticulosis    per ct scans 09-2016 and 01-2017  . DVT (deep venous thrombosis) ([place]) 2011   small, developed after venous ablation  . DVT (deep venous thrombosis) ([place])   . Family history of adverse reaction to anesthesia    O2 levels drop upon waking   . Hearing loss sensory, bilateral  2013   mod-severe high freq sensorineural ([facility])  . Hiatal hernia     09-2016 ct scan HP at cancer center   . History of kidney stones 1980s  . History of radiation therapy 07/19/11-08/25/2011   RIGHT AXILLARY REGION/METASTATIC  . Hypertension   . Melanoma ([place]) 06/29/10   MALIGNANT MELANOMA R SHOULDER/SUPRASCAPULAR BACK s/p interferon chemo and XRT  . Neuromuscular disorder ([facility])    muscle spasms  . OSA (obstructive sleep apnea)    on CPAP  . Pneumonia    2001  . RLS (restless legs syndrome)   . Seasonal allergies   . Sleep apnea    wears cpap  . Tinnitus   . Unspecified vitamin D deficiency 03/18/2013  . Varicose veins of left lower extremity     Past Surgical History:  Procedure Laterality Date  . ABI  2016   WNL  . ANTERIOR CERVICAL DECOMP/DISCECTOMY FUSION N/A 08/22/2015   ANTERIOR CERVICAL DECOMPRESSION FUSION C3/4 - interbody graft and anterior plate; Kristeen Miss, MD  . ANTERIOR CERVICAL DECOMP/DISCECTOMY FUSION N/A 07/02/2016   Procedure: Cervical four- five Cervical five- six Cervical six- seven Anterior cervical decompression/diskectomy/fusion;  Surgeon: Kristeen Miss, MD;  Location:  [facility];  Service: Neurosurgery;  Laterality: N/A;  C4-5 C5-6 C6-7 Anterior cervical decompression/diskectomy/fusion  . BREAST BIOPSY Left 2013   BENIGN  . BREAST SURGERY Right 1990   BX   . CARDIOVASCULAR STRESS TEST  2010   normal stress test, EF 66%  . CATARACT EXTRACTION W/ INTRAOCULAR LENS IMPLANT Left 2019   Dr. Kathrin Penner  . COLONOSCOPY  03/2005   benign polyp, int hem, rpt 5 yrs ([place], [place])  . COLONOSCOPY WITH PROPOFOL N/A 01/06/2018   diverticulosis, decreased sphincter tone, no f/u needed [name], [place]ke Corin, MD)  . DEEP AXILLARY SENTINEL NODE BIOPSY / EXCISION  2012   RIGHT  . DEXA  06/2016   WNL  . DILATION AND CURETTAGE OF UTERUS  2010  . ENDOVENOUS ABLATION SAPHENOUS VEIN W/ LASER Left 2010   [facility]  . ENDOVENOUS ABLATION SAPHENOUS VEIN W/ LASER  Left 2016   accessory branch [facility] Kellie Simmering)  . ESOPHAGOGASTRODUODENOSCOPY  2006   mod chronic carditis, no H pylori ([place], [place])  . INTRAVASCULAR ULTRASOUND/IVUS N/A 08/03/2019   Waynetta Sandy, [place]   left  . LOWER EXTREMITY VENOGRAPHY Left 08/03/2019   Waynetta Sandy, MD  . LUMBAR LAMINECTOMY  2001   L4/5  . MELANOMA EXCISION  2011   Right shoulder, with sentinel lymph node biopsy  . PERIPHERAL VASCULAR INTERVENTION Left 08/03/2019   Stent of left common iliac vein with 14 x 60 mm Vici Donzetta Matters, [place] Dom, MD)  . PORT-A-CATH REMOVAL  12/05/2011   Procedure: REMOVAL PORT-A-CATH;  Surgeon: Rolm Bookbinder, MD;  Location: [facility];  Service: General;  Laterality: N/A;  left port removal  . PORTACATH PLACEMENT     left subclavian  . UPPER GASTROINTESTINAL ENDOSCOPY      Social History   Socioeconomic History  . Marital status: Divorced    Spouse name: Not on file  . Number of children: 0  . Years of education: Not on file  . Highest education level: Not on file  Occupational History  . Occupation: retired    Fish farm manager: RETIRED    Comment: Government social research officer   Tobacco Use  . Smoking status: Former Smoker    Types: Cigarettes    Quit date: 11/05/1965    Years since quitting: 54.3  . Smokeless tobacco: Never Used  . Tobacco comment: socially as a teen  Substance and Sexual Activity  . Alcohol use: No    Alcohol/week: 0.0 standard drinks  . Drug use: No  . Sexual activity: Never  Other Topics Concern  . Not on file  Social History Narrative   Lives alone.  Sister and husband live next door   Lives in [place].   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   Edu: some college   Diet: poor - good water, rare fruits/vegetables   Social Determinants of Health   Financial Resource Strain: Low Risk   . Difficulty of Paying Living Expenses: Not hard at all  Food Insecurity: No Food Insecurity   . Worried About Charity fundraiser in the Last Year: Never true  . Ran Out of Food in the Last Year: Never true  Transportation Needs: No Transportation Needs  . Lack of Transportation (Medical): No  . Lack of Transportation (Non-Medical): No  Physical Activity: Inactive  . Days of Exercise per Week: 0 days  . Minutes of Exercise per Session: 0 min  Stress: No Stress  Concern Present  . Feeling of Stress : Not at all  Social Connections:   . Frequency of Communication with Friends and Family:   . Frequency of Social Gatherings with Friends and Family:   . Attends Religious Services:   . Active Member of Clubs or Organizations:   . Attends Archivist Meetings:   Marland Kitchen Marital Status:   Intimate Partner Violence: Not At Risk  . Fear of Current or Ex-Partner: No  . Emotionally Abused: No  . Physically Abused: No  . Sexually Abused: No    Family History  Problem Relation Age of Onset  . Cancer Mother 58       uterine  . CAD Father 58       MI  . Hypertension Father 95  . Alzheimer's disease Father 20  . Cancer Cousin        x2, breast  . Diabetes Sister   . Cancer Paternal Grandmother        melanoma, possibly  . Colon cancer Neg Hx   . Colon polyps Neg Hx   . Rectal cancer Neg Hx   . Stomach cancer Neg Hx     Current Outpatient Medications on File Prior to Visit  Medication Sig Dispense Refill  . acetaminophen (TYLENOL ARTHRITIS PAIN) 650 MG CR tablet Take 1,300 mg by mouth every 8 (eight) hours as needed for pain.     Marland Kitchen albuterol (PROVENTIL) (2.5 MG/3ML) 0.083% nebulizer solution Take 3 mLs (2.5 mg total) by nebulization every 6 (six) hours as needed for wheezing or shortness of breath. 150 mL 1  . albuterol (VENTOLIN HFA) 108 (90 Base) MCG/ACT inhaler INHALE 2 PUFFS BY MOUTH EVERY 6 HORUS AS NEEDED FOR WHEEZING/SHORTNESS OF BREATH 8.5 g 3  . aspirin EC 81 MG tablet Take 81 mg by mouth at bedtime. Melanoma prevention    . azelastine (ASTELIN) 0.1 % nasal spray  Place 1 spray into both nostrils 2 (two) times daily. Use in each nostril as directed 30 mL 3  . cephALEXin (KEFLEX) 500 MG capsule Take 500 mg by mouth 3 (three) times daily.    . famotidine (PEPCID) 20 MG tablet Take 1 tablet (20 mg total) by mouth at bedtime.    . furosemide (LASIX) 40 MG tablet TAKE 1 TABLET BY MOUTH EVERY DAY 30 tablet 5  . loratadine (CLARITIN) 10 MG tablet Take 10 mg by mouth daily.    Marland Kitchen losartan (COZAAR) 25 MG tablet TAKE 2 TABLETS BY MOUTH EVERY DAY 60 tablet 5  . meloxicam (MOBIC) 15 MG tablet Take 1 tablet (15 mg total) by mouth daily as needed for pain. 30 tablet 1  . montelukast (SINGULAIR) 10 MG tablet TAKE 1 TABLET BY MOUTH EVERY DAY 30 tablet 3  . Multiple Vitamin (MULTIVITAMIN WITH MINERALS) TABS tablet Take 1 tablet by mouth daily.    . pentoxifylline (TRENTAL) 400 MG CR tablet Take 400 mg by mouth 3 (three) times daily.    . Polyvinyl Alcohol-Povidone (TEARS PLUS OP) Place 1 drop into both eyes daily as needed (dry eyes/ redness/ burning).    . potassium chloride SA (KLOR-CON) 20 MEQ tablet Take 1 tablet (20 mEq total) by mouth 2 (two) times daily. 180 tablet 3  . PRESCRIPTION MEDICATION CPAP    . Probiotic Product (PROBIOTIC DAILY PO) Take 1 tablet by mouth daily.     Marland Kitchen triamcinolone (NASACORT ALLERGY 24HR) 55 MCG/ACT AERO nasal inhaler Place 2 sprays into the nose daily. In each nostril    .  UNABLE TO FIND Take by mouth.    . Vitamin D, Ergocalciferol, (DRISDOL) 1.25 MG (50000 UNIT) CAPS capsule TAKE 1 CAPSULE (50,000 UNITS TOTAL) BY MOUTH EVERY 7 (SEVEN) DAYS. 12 capsule 3   No current facility-administered medications on file prior to visit.    Allergies as of 02/12/2020 - Review Complete 02/12/2020  Allergen Reaction Noted  . Sulfa antibiotics Itching, Swelling, and Shortness Of Breath 08/16/2015  . Voltaren [diclofenac sodium] Shortness Of Breath 08/16/2017  . Latex Other (See Comments) 06/22/2016  . Tape Other (See Comments) 08/16/2015  .  Doxycycline Other (See Comments) 02/01/2020  . Other  11/22/2017     ROS:   General:  No weight loss, Fever, chills  HEENT: No recent headaches, no nasal bleeding, no visual changes, no sore throat  Neurologic: No dizziness, blackouts, seizures. No recent symptoms of stroke or mini- stroke. No recent episodes of slurred speech, or temporary blindness.  Cardiac: No recent episodes of chest pain/pressure, no shortness of breath at rest.  No shortness of breath with exertion.  Denies history of atrial fibrillation or irregular heartbeat  Vascular: No history of rest pain in feet.  No history of claudication.  + history of non-healing ulcer, No history of DVT   Pulmonary: No home oxygen, no productive cough, no hemoptysis,  No asthma or wheezing  Musculoskeletal:  [ ]  Arthritis, [ ]  Low back pain,  [ ]  Joint pain  Hematologic:No history of hypercoagulable state.  No history of easy bleeding.  No history of anemia  Gastrointestinal: No hematochezia or melena,  No gastroesophageal reflux, no trouble swallowing  Urinary: [ ]  chronic Kidney disease, [ ]  on HD - [ ]  MWF or [ ]  TTHS, [ ]  Burning with urination, [ ]  Frequent urination, [ ]  Difficulty urinating;   Skin: No rashes  Psychological: No history of anxiety,  No history of depression  Physical Examination  Vitals:   02/12/20 1034  BP: (!) 157/87  Pulse: 80  Resp: 18  SpO2: 98%  Weight: 223 lb 14.4 oz (101.6 kg)  Height: 5\' 3"  (1.6 m)    Body mass index is 39.66 kg/m.  General:  Alert and oriented, no acute distress HEENT: Normal Neck: No bruit or JVD Pulmonary: Clear to auscultation bilaterally Cardiac: Regular Rate and Rhythm without murmur Abdomen: Soft, non-tender, non-distended, no mass, no scars Skin: No rash Extremity Pulses:  2+ radial, brachial, femoral, dorsalis pedis, posterior tibial pulses bilaterally Musculoskeletal: Positive edema B LE with brawny skin changes and varicosities in B feet/ankles and LE.   She has a new pin point wound for the first time on the medial right calf, superficial healing wounds on the left.  .    Neurologic: Upper and lower extremity motor 5/5 and symmetric  DATA: Pressure (mmHg) Index Waveform Comment Brachial lymphectomy PTA 169 1.21 triphasic DP 172 1.23 triphasic Great Toe 112 0.80 Left Lt Pressure (mmHg) Index Waveform Comment Brachial 140 PTA 174 1.24 triphasic DP 187 1.34 triphasic Great Toe 150 1.07 ABI/TBI Today's ABI Today's TBI Previous ABI Previous TBI Right 1.23 0.8 Left 1.34 1.07 Assessment: Chronic venous insufficiency with non healing venous stasis wounds on the left calf and new pin point wound on the right calf.  She has normal arterial flow in B LE.  Plan: She will continue wound care with Dr. Dellia Nims.  She will return in 6 months for follow up venous US IVC/iliac and right LE reflux study.  She has skin changes, varicose veins and edema  B LE.  The new right calf wound is likely a new venous stasis ulcer.    Roxy Horseman PA-C [facility] [place] Office: [phone]  MD in clinic [place]

## 2020-02-12 NOTE — Progress Notes (Signed)
[name], [name] Visit Report for 02/10/2020 Arrival Information Details Patient Name: [name], [name]. Date of Service: 02/10/2020 1:15 PM Medical Record Number: [id number] Patient Account Number: [id number] Date of Birth/Sex: 1946-10-27 (74 y.o. F) Treating RN: [name] Primary Care [name]: [name] Other Clinician: Referring [name]: [name] Treating [name]/Extender: [name] in Treatment: 61 Visit Information History Since Last Visit Added or deleted any medications: No Patient Arrived: Ambulatory Any new allergies or adverse reactions: No Arrival Time: 13:08 Had a fall or experienced change in No Accompanied By: self activities of daily living that may affect Transfer Assistance: None risk of falls: Patient Identification Verified: Yes Signs or symptoms of abuse/neglect since last visito No Secondary Verification Process Completed: Yes Hospitalized since last visit: No Patient Requires Transmission-Based No Implantable device outside of the clinic excluding No Precautions: cellular tissue based products placed in the center Patient Has Alerts: Yes since last visit: Patient Alerts: Patient on Blood Pain Present Now: No Thinner aspirin 81 Electronic Signature(s) Signed: 02/10/2020 3:46:59 PM By: [name] RCP, RRT, CHT Entered By: [name] on 02/10/2020 13:10:59 [name] -------------------------------------------------------------------------------- Compression Therapy Details Patient Name: [name], [name]. Date of Service: 02/10/2020 1:15 PM Medical Record Number: [id number] Patient Account Number: [id number] Date of Birth/Sex: [birth year] (74 y.o. F) Treating RN: [name] Primary Care Aamirah Salmi: [name] Other Clinician: Referring Jeffery Gammell: [name] Treating Tobin Cadiente/Extender: [name] in Treatment: 61 Compression Therapy Performed  for Wound Assessment: Wound #5 Left,Medial Lower Leg Performed By: Clinician Amber Barman, RN Compression Type: Four Layer Pre Treatment ABI: 1 Post Procedure Diagnosis Same as Pre-procedure Electronic Signature(s) Signed: 02/12/2020 10:29:18 AM By: [name], [name], RN, CWS, [name], [name] Entered By: [name], [name], RN, CWS, Kim on 02/10/2020 13:43:39 [name] -------------------------------------------------------------------------------- Compression Therapy Details Patient Name: [name], [name]. Date of Service: 02/10/2020 1:15 PM Medical Record Number: [id number] Patient Account Number: [id number] Date of Birth/Sex: Aug 17, 1946 (74 y.o. F) Treating RN: [name] Primary Care Kyrie Fludd: [name] Other Clinician: Referring Daina Cara: [name] Treating Savilla Turbyfill/Extender: [name] in Treatment: 61 Compression Therapy Performed for Wound Assessment: Wound #10 Left,Proximal,Lateral Lower Leg Performed By: Clinician Amber Barman, RN Compression Type: Four Layer Pre Treatment ABI: 1 Post Procedure Diagnosis Same as Pre-procedure Electronic Signature(s) Signed: 02/12/2020 10:29:18 AM By: [name], [name], RN, CWS, [name], [name] Entered By: [name], [name], RN, CWS, Kim on 02/10/2020 13:43:40 [name] -------------------------------------------------------------------------------- Compression Therapy Details Patient Name: [name], [name]. Date of Service: 02/10/2020 1:15 PM Medical Record Number: [id number] Patient Account Number: [id number] Date of Birth/Sex: 1946/06/18 (74 y.o. F) Treating RN: [name] Primary Care Brecklynn Jian: [name] Other Clinician: Referring Lakevia Perris: [name] Treating Cinderella [name]/Extender: [name] in Treatment: 61 Compression Therapy Performed for Wound Assessment: Wound #6 Left,Lateral Lower Leg Performed By: Clinician Amber Barman, RN Compression Type: Four Layer Pre Treatment ABI: 1 Post  Procedure Diagnosis Same as Pre-procedure Electronic Signature(s) Signed: 02/12/2020 10:29:18 AM By: [name], [name], RN, CWS, [name], [name] Entered By: [name], [name], RN, CWS, Kim on 02/10/2020 13:43:40 [name], [name] -------------------------------------------------------------------------------- Compression Therapy Details Patient Name: [name], [name]. Date of Service: 02/10/2020 1:15 PM Medical Record Number: [id number] Patient Account Number: [id number] Date of Birth/Sex: 30-May-1946 (74 y.o. F) Treating RN: [name] Primary Care Naleah Kofoed: [name] Other Clinician: Referring Lavenia Stumpo: [name] Treating Arcenia Scarbro/Extender: [name] in Treatment: 61 Compression Therapy Performed for Wound Assessment: Wound #  9 Left,Lateral,Posterior Lower Leg Performed By: Clinician Amber Barman, RN Compression Type: Four Layer Pre Treatment ABI: 1 Post Procedure Diagnosis Same as Pre-procedure Electronic Signature(s) Signed: 02/12/2020 10:29:18 AM By: [name], [name], RN, CWS, [name], [name] Entered By: [name], [name], RN, CWS, Kim on 02/10/2020 13:43:40 [name] -------------------------------------------------------------------------------- Compression Therapy Details Patient Name: [name], [name]. Date of Service: 02/10/2020 1:15 PM Medical Record Number: [id number] Patient Account Number: [id number] Date of Birth/Sex: 12-27-1945 (74 y.o. F) Treating RN: [name] Primary Care Raylynne Cubbage: [name] Other Clinician: Referring Wendi Lastra: [name] Treating Usiel Astarita/Extender: [name] in Treatment: 61 Compression Therapy Performed for Wound Assessment: Wound #11 Right,Medial Lower Leg Performed By: Clinician Amber Barman, RN Compression Type: Four Layer Pre Treatment ABI: 1 Post Procedure Diagnosis Same as Pre-procedure Electronic Signature(s) Signed: 02/12/2020 10:29:18 AM By: [name], [name], RN, CWS, [name], [name] Entered By: [name],  [name], RN, CWS, Kim on 02/10/2020 13:43:40 [name], [name] -------------------------------------------------------------------------------- Encounter Discharge Information Details Patient Name: ZAMERIA, VANACKER. Date of Service: 02/10/2020 1:15 PM Medical Record Number: [id number] Patient Account Number: [id number] Date of Birth/Sex: [birth year] (74 y.o. F) Treating RN: [name] Primary Care Yazmen Briones: [name] Other Clinician: Referring Jadarion Halbig: [name] Treating Kyrra Prada/Extender: [name] in Treatment: 14 Encounter Discharge Information Items Post Procedure Vitals Discharge Condition: Stable Temperature (F): 98.7 Ambulatory Status: Ambulatory Pulse (bpm): 83 Discharge Destination: Home Respiratory Rate (breaths/min): 18 Transportation: Private Auto Blood Pressure (mmHg): 137/69 Accompanied By: self Schedule Follow-up Appointment: Yes Clinical Summary of Care: Electronic Signature(s) Signed: 02/12/2020 10:29:18 AM By: [name], [name], RN, CWS, [name], [name] Entered By: [name], [name], RN, CWS, Kim on 02/10/2020 13:48:44 [name] -------------------------------------------------------------------------------- Lower Extremity Assessment Details Patient Name: [name], [name]. Date of Service: 02/10/2020 1:15 PM Medical Record Number: [id number] Patient Account Number: [id number] Date of Birth/Sex: [birth year] (74 y.o. F) Treating RN: Army Melia Primary Care Londyn Hotard: [name] Other Clinician: Referring Molli Gethers: [name] Treating Marya Lowden/Extender: [name] in Treatment: 61 Edema Assessment Assessed: [Left: No] [Right: No] Edema: [Left: No] [Right: No] Calf Left: Right: Point of Measurement: 33 cm From Medial Instep 48 cm 42 cm Ankle Left: Right: Point of Measurement: 10 cm From Medial Instep 24 cm 24 cm Vascular Assessment Pulses: Dorsalis Pedis Palpable: [Left:Yes] [Right:Yes] Electronic  Signature(s) Signed: 02/10/2020 2:25:34 PM By: Army Melia Entered By: Army Melia on 02/10/2020 13:25:53 [name], [name] -------------------------------------------------------------------------------- Multi Wound Chart Details Patient Name: [name], [name]. Date of Service: 02/10/2020 1:15 PM Medical Record Number: [id number] Patient Account Number: [id number] Date of Birth/Sex: 09/21/1946 (74 y.o. F) Treating RN: [name] Primary Care Myrikal Messmer: [name] Other Clinician: Referring Sidra Oldfield: [name] Treating Dyasia Firestine/Extender: [name] in Treatment: 58 Vital Signs Height(in): 68 Pulse(bpm): 18 Weight(lbs): 224.7 Blood Pressure(mmHg): 137/69 Body Mass Index(BMI): 40 Temperature(F): 98.7 Respiratory Rate(breaths/min): 18 Photos: Wound Location: Left, Proximal, Lateral Lower Leg Right, Medial Lower Leg Left, Medial Lower Leg Wounding Event: Blister Gradually Appeared Gradually Appeared Primary Etiology: Venous Leg Ulcer Venous Leg Ulcer Lymphedema Comorbid History: Cataracts, Asthma, Sleep Apnea, Cataracts, Asthma, Sleep Apnea, Cataracts, Asthma, Sleep Apnea, Deep Vein Thrombosis, Deep Vein Thrombosis, Deep Vein Thrombosis, Hypertension, Peripheral Venous Hypertension, Peripheral Venous Hypertension, Peripheral Venous Disease, Osteoarthritis, Received Disease, Osteoarthritis, Received Disease, Osteoarthritis, Received Chemotherapy, Received Radiation Chemotherapy, Received Radiation Chemotherapy, Received Radiation Date Acquired: 01/27/2020 02/04/2020 11/19/2018 Weeks of Treatment: 2 0 61 Wound Status: Open Open Open Measurements L x W x D (cm) 0.1x0.1x0.1  1x0.7x0.1 1.1x0.6x0.1 Area (cm) : 0.008 0.55 0.518 Volume (cm) : 0.001 0.055 0.052 % Reduction in Area: 99.30% 29.90% 93.90% % Reduction in Volume: 99.10% 30.40% 93.90% Classification: Full Thickness Without Exposed Full Thickness Without Exposed Full Thickness Without  Exposed Support Structures Support Structures Support Structures Exudate Amount: Medium Medium Medium Exudate Type: Serosanguineous Serosanguineous Serosanguineous Exudate Color: red, brown red, brown red, brown Wound Margin: N/A N/A N/A Granulation Amount: Large (67-100%) Medium (34-66%) Large (67-100%) Granulation Quality: Pink Pink Pink Necrotic Amount: Small (1-33%) Medium (34-66%) Small (1-33%) Exposed Structures: Fat Layer (Subcutaneous Tissue) Fat Layer (Subcutaneous Tissue) Fat Layer (Subcutaneous Tissue) Exposed: Yes Exposed: Yes Exposed: Yes Fascia: No Fascia: No Fascia: No Tendon: No Tendon: No Tendon: No Muscle: No Muscle: No Muscle: No Joint: No Joint: No Joint: No Bone: No Bone: No Bone: No Epithelialization: None Small (1-33%) None Debridement: N/A Debridement - Excisional N/A Pre-procedure Verification/Time N/A 13:41 N/A Out Taken: Pain Control: N/A Lidocaine N/A Tissue Debrided: N/A Subcutaneous N/A [name], [name] Level: N/A Skin/Subcutaneous Tissue N/A Debridement Area (sq cm): N/A 0.7 N/A Instrument: N/A Curette N/A Bleeding: N/A Minimum N/A Debridement Treatment N/A Procedure was tolerated well N/A Response: Post Debridement Measurements N/A 1x0.7x0.1 N/A L x W x D (cm) Post Debridement Volume: (cm) N/A 0.055 N/A Procedures Performed: Compression Therapy Compression Therapy Compression Therapy Debridement Wound Number: 6 9 N/A Photos: N/A Wound Location: Left, Lateral Lower Leg Left, Lateral, Posterior Lower Leg N/A Wounding Event: Gradually Appeared Gradually Appeared N/A Primary Etiology: Venous Leg Ulcer Venous Leg Ulcer N/A Comorbid History: Cataracts, Asthma, Sleep Apnea, Cataracts, Asthma, Sleep Apnea, N/A Deep Vein Thrombosis, Deep Vein Thrombosis, Hypertension, Peripheral Venous Hypertension, Peripheral Venous Disease, Osteoarthritis, Received Disease, Osteoarthritis, Received Chemotherapy, Received Radiation  Chemotherapy, Received Radiation Date Acquired: 01/19/2019 07/08/2019 N/A Weeks of Treatment: 55 31 N/A Wound Status: Open Open N/A Measurements L x W x D (cm) 1.2x1x0.1 0.2x0.2x0.1 N/A Area (cm) : 0.942 0.031 N/A Volume (cm) : 0.094 0.003 N/A % Reduction in Area: -328.20% 94.50% N/A % Reduction in Volume: -327.30% 94.70% N/A Classification: Full Thickness Without Exposed Full Thickness Without Exposed N/A Support Structures Support Structures Exudate Amount: Medium Medium N/A Exudate Type: Serosanguineous Serosanguineous N/A Exudate Color: red, brown red, brown N/A Wound Margin: Flat and Intact N/A N/A Granulation Amount: Large (67-100%) Large (67-100%) N/A Granulation Quality: Pink Pink N/A Necrotic Amount: Small (1-33%) Small (1-33%) N/A Exposed Structures: Fat Layer (Subcutaneous Tissue) Fat Layer (Subcutaneous Tissue) N/A Exposed: Yes Exposed: Yes Fascia: No Fascia: No Tendon: No Tendon: No Muscle: No Muscle: No Joint: No Joint: No Bone: No Bone: No Epithelialization: None Medium (34-66%) N/A Debridement: N/A N/A N/A Pain Control: N/A N/A N/A Tissue Debrided: N/A N/A N/A Level: N/A N/A N/A Debridement Area (sq cm): N/A N/A N/A Instrument: N/A N/A N/A Bleeding: N/A N/A N/A Debridement Treatment N/A N/A N/A Response: Post Debridement Measurements N/A N/A N/A L x W x D (cm) Post Debridement Volume: (cm) N/A N/A N/A CATISHA, CURCURU ([id number]) Procedures Performed: Compression Therapy Compression Therapy N/A Treatment Notes Wound #10 (Left, Proximal, Lateral Lower Leg) Notes Bilateral: alginate, ABD, 4 layer TCA Wound #11 (Right, Medial Lower Leg) Notes Bilateral: alginate, ABD, 4 layer TCA Wound #5 (Left, Medial Lower Leg) Notes Bilateral: alginate, ABD, 4 layer TCA Wound #6 (Left, Lateral Lower Leg) Notes Bilateral: alginate, ABD, 4 layer TCA Wound #9 (Left, Lateral, Posterior Lower Leg) Notes Bilateral: alginate, ABD, 4 layer TCA Electronic  Signature(s) Signed: 02/10/2020 4:54:03 PM By: Linton Ham MD  Entered By: Linton Ham on 02/10/2020 13:48:19 Liberman, [name] -------------------------------------------------------------------------------- Multi-Disciplinary Care Plan Details Patient Name: JASHANTI, BURLEIGH. Date of Service: 02/10/2020 1:15 PM Medical Record Number: [id number] Patient Account Number: [id number] Date of Birth/Sex: 01-12-1946 (74 y.o. F) Treating RN: [name] Primary Care Abisai Deer: [name] Other Clinician: Referring Yves Fodor: [name] Treating Mateja Dier/Extender: [name] in Treatment: 83 Active Inactive Medication Nursing Diagnoses: Knowledge deficit related to medication safety: actual or potential Goals: Patient/caregiver will demonstrate understanding of new oral/IV medications prescribed at the [facility] (topical prescriptions are covered under the skin breakdown problem) Date Initiated: 12/16/2019 Target Resolution Date: 01/13/2020 Goal Status: Active Interventions: Assess for medication contraindications each visit where new medications are prescribed Treatment Activities: New medication prescribed at [facility] : 12/16/2019 Notes: Soft Tissue Infection Nursing Diagnoses: Impaired tissue integrity Goals: Patient's soft tissue infection will resolve Date Initiated: 12/10/2018 Target Resolution Date: 01/09/2019 Goal Status: Active Interventions: Assess signs and symptoms of infection every visit Notes: Venous Leg Ulcer Nursing Diagnoses: Actual venous Insuffiency (use after diagnosis is confirmed) Goals: Patient will maintain optimal edema control Date Initiated: 12/10/2018 Target Resolution Date: 01/09/2019 Goal Status: Active Interventions: Assess peripheral edema status every visit. Treatment Activities: Therapeutic compression applied : 12/10/2018 CLARECE, SZEKERES ([id number]) Notes: Wound/Skin Impairment Nursing Diagnoses: Impaired tissue  integrity Goals: Patient/caregiver will verbalize understanding of skin care regimen Date Initiated: 12/10/2018 Target Resolution Date: 01/09/2019 Goal Status: Active Interventions: Assess ulceration(s) every visit Treatment Activities: Topical wound management initiated : 12/10/2018 Notes: Electronic Signature(s) Signed: 02/12/2020 10:29:18 AM By: [name], [name], RN, CWS, [name], [name] Entered By: [name], [name], RN, CWS, Kim on 02/10/2020 13:38:10 MISSEY, MCCRIGHT ([id number]) -------------------------------------------------------------------------------- Pain Assessment Details Patient Name: MARA, BHULLAR. Date of Service: 02/10/2020 1:15 PM Medical Record Number: [id number] Patient Account Number: [id number] Date of Birth/Sex: 12-Sep-1946 (74 y.o. F) Treating RN: Army Melia Primary Care Cela Newcom: [name] Other Clinician: Referring Fabiha Rougeau: [name] Treating Tyrion Glaude/Extender: [name] in Treatment: 84 Active Problems Location of Pain Severity and Description of Pain Patient Has Paino No Site Locations Pain Management and Medication Current Pain Management: Electronic Signature(s) Signed: 02/10/2020 2:25:34 PM By: Army Melia Entered By: Army Melia on 02/10/2020 13:16:35 Oberhaus, [name] -------------------------------------------------------------------------------- Patient/Caregiver Education Details Patient Name: LASHAYNA, CARLYLE. Date of Service: 02/10/2020 1:15 PM Medical Record Number: [id number] Patient Account Number: [id number] Date of Birth/Gender: 1946/05/28 (74 y.o. F) Treating RN: [name] Primary Care Physician: [name] Other Clinician: Referring Physician: Ria [name] Treating Physician/Extender: [name] in Treatment: 53 Education Assessment Education Provided To: Patient Education Topics Provided Venous: Handouts: Controlling Swelling with Multilayered Compression Wraps Methods:  Demonstration, Explain/Verbal Responses: State content correctly Wound/Skin Impairment: Handouts: Caring for Your Ulcer Methods: Demonstration, Explain/Verbal Responses: State content correctly Electronic Signature(s) Signed: 02/12/2020 10:29:18 AM By: [name], [name], RN, CWS, [name], [name] Entered By: [name], [name], RN, CWS, Kim on 02/10/2020 13:47:17 [place]ett, [name] -------------------------------------------------------------------------------- Wound Assessment Details Patient Name: SEYLA, SORG. Date of Service: 02/10/2020 1:15 PM Medical Record Number: [id number] Patient Account Number: [id number] Date of Birth/Sex: 06-Apr-1946 (74 y.o. F) Treating RN: Army Melia Primary Care Corben Auzenne: [name] Other Clinician: Referring Deneshia Zucker: [name] Treating Adylene Dlugosz/Extender: [name] in Treatment: 61 Wound Status Wound Number: 10 Primary Venous Leg Ulcer Etiology: Wound Location: Left, Proximal, Lateral Lower Leg Wound Open Wounding Event: Blister Status: Date Acquired: 01/27/2020 Comorbid Cataracts, Asthma, Sleep Apnea, Deep Vein Thrombosis, Weeks Of Treatment: 2 History: Hypertension,  Peripheral Venous Disease, Osteoarthritis, Clustered Wound: No Received Chemotherapy, Received Radiation Photos Wound Measurements Length: (cm) 0.1 Width: (cm) 0.1 Depth: (cm) 0.1 Area: (cm) 0.008 Volume: (cm) 0.001 % Reduction in Area: 99.3% % Reduction in Volume: 99.1% Epithelialization: None Wound Description Classification: Full Thickness Without Exposed Support Struct Exudate Amount: Medium Exudate Type: Serosanguineous Exudate Color: red, brown ures Foul Odor After Cleansing: No Slough/Fibrino Yes Wound Bed Granulation Amount: Large (67-100%) Exposed Structure Granulation Quality: Pink Fascia Exposed: No Necrotic Amount: Small (1-33%) Fat Layer (Subcutaneous Tissue) Exposed: Yes Necrotic Quality: Adherent Slough Tendon Exposed: No Muscle  Exposed: No Joint Exposed: No Bone Exposed: No Treatment Notes Wound #10 (Left, Proximal, Lateral Lower Leg) Notes Bilateral: alginate, ABD, 4 layer TCA Electronic Signature(s) Signed: 02/10/2020 2:25:34 PM By: Renee Rival ([id number]) Entered By: Army Melia on 02/10/2020 13:20:25 [name], [name] -------------------------------------------------------------------------------- Wound Assessment Details Patient Name: TAKYA, BOSWELL. Date of Service: 02/10/2020 1:15 PM Medical Record Number: [id number] Patient Account Number: [id number] Date of Birth/Sex: 11/09/1945 (74 y.o. F) Treating RN: Army Melia Primary Care Takima Encina: [name] Other Clinician: Referring Marcellino Fidalgo: [name] Treating Andalyn Heckstall/Extender: [name] in Treatment: 61 Wound Status Wound Number: 11 Primary Venous Leg Ulcer Etiology: Wound Location: Right, Medial Lower Leg Wound Open Wounding Event: Gradually Appeared Status: Date Acquired: 02/04/2020 Comorbid Cataracts, Asthma, Sleep Apnea, Deep Vein Thrombosis, Weeks Of Treatment: 0 History: Hypertension, Peripheral Venous Disease, Osteoarthritis, Clustered Wound: No Received Chemotherapy, Received Radiation Photos Wound Measurements Length: (cm) 1 Width: (cm) 0.7 Depth: (cm) 0.1 Area: (cm) 0.55 Volume: (cm) 0.055 % Reduction in Area: 29.9% % Reduction in Volume: 30.4% Epithelialization: Small (1-33%) Wound Description Classification: Full Thickness Without Exposed Support Struct Exudate Amount: Medium Exudate Type: Serosanguineous Exudate Color: red, brown ures Foul Odor After Cleansing: No Slough/Fibrino Yes Wound Bed Granulation Amount: Medium (34-66%) Exposed Structure Granulation Quality: Pink Fascia Exposed: No Necrotic Amount: Medium (34-66%) Fat Layer (Subcutaneous Tissue) Exposed: Yes Necrotic Quality: Adherent Slough Tendon Exposed: No Muscle Exposed: No Joint Exposed:  No Bone Exposed: No Treatment Notes Wound #11 (Right, Medial Lower Leg) Notes Bilateral: alginate, ABD, 4 layer TCA Electronic Signature(s) Signed: 02/10/2020 2:25:34 PM By: Renee Rival ([id number]) Entered By: Army Melia on 02/10/2020 13:21:48 Burson, [name] -------------------------------------------------------------------------------- Wound Assessment Details Patient Name: SAMIRIA, SUNDELL. Date of Service: 02/10/2020 1:15 PM Medical Record Number: [id number] Patient Account Number: [id number] Date of Birth/Sex: Feb 07, 1946 (74 y.o. F) Treating RN: Army Melia Primary Care Waverly Tarquinio: [name] Other Clinician: Referring Erina Hamme: [name] Treating Tashawnda Bleiler/Extender: [name] in Treatment: 61 Wound Status Wound Number: 5 Primary Lymphedema Etiology: Wound Location: Left, Medial Lower Leg Wound Open Wounding Event: Gradually Appeared Status: Date Acquired: 11/19/2018 Comorbid Cataracts, Asthma, Sleep Apnea, Deep Vein Thrombosis, Weeks Of Treatment: 61 History: Hypertension, Peripheral Venous Disease, Osteoarthritis, Clustered Wound: No Received Chemotherapy, Received Radiation Photos Wound Measurements Length: (cm) 1.1 Width: (cm) 0.6 Depth: (cm) 0.1 Area: (cm) 0.518 Volume: (cm) 0.052 % Reduction in Area: 93.9% % Reduction in Volume: 93.9% Epithelialization: None Wound Description Classification: Full Thickness Without Exposed Support Struct Exudate Amount: Medium Exudate Type: Serosanguineous Exudate Color: red, brown ures Foul Odor After Cleansing: No Slough/Fibrino Yes Wound Bed Granulation Amount: Large (67-100%) Exposed Structure Granulation Quality: Pink Fascia Exposed: No Necrotic Amount: Small (1-33%) Fat Layer (Subcutaneous Tissue) Exposed: Yes Necrotic Quality: Adherent Slough Tendon Exposed: No Muscle Exposed: No Joint Exposed: No Bone Exposed: No Treatment Notes Wound #5 (Left,  Medial Lower  Leg) Notes Bilateral: alginate, ABD, 4 layer TCA Electronic Signature(s) Signed: 02/10/2020 2:25:34 PM By: Renee Rival ([id number]) Entered By: Army Melia on 02/10/2020 13:22:29 TAYANNA, BRUMIT ([id number]) -------------------------------------------------------------------------------- Wound Assessment Details Patient Name: MARY-ANN, WOZNY. Date of Service: 02/10/2020 1:15 PM Medical Record Number: [id number] Patient Account Number: [id number] Date of Birth/Sex: Dec 03, 1945 (73 y.o. F) Treating RN: Army Melia Primary Care Nataniel Gasper: [name] Other Clinician: Referring Jatavian Calica: [name] Treating Bernd Crom/Extender: [name] in Treatment: 61 Wound Status Wound Number: 6 Primary Venous Leg Ulcer Etiology: Wound Location: Left, Lateral Lower Leg Wound Open Wounding Event: Gradually Appeared Status: Date Acquired: 01/19/2019 Comorbid Cataracts, Asthma, Sleep Apnea, Deep Vein Thrombosis, Weeks Of Treatment: 55 History: Hypertension, Peripheral Venous Disease, Osteoarthritis, Clustered Wound: No Received Chemotherapy, Received Radiation Photos Wound Measurements Length: (cm) 1.2 Width: (cm) 1 Depth: (cm) 0.1 Area: (cm) 0.942 Volume: (cm) 0.094 % Reduction in Area: -328.2% % Reduction in Volume: -327.3% Epithelialization: None Wound Description Classification: Full Thickness Without Exposed Support Struct Wound Margin: Flat and Intact Exudate Amount: Medium Exudate Type: Serosanguineous Exudate Color: red, brown ures Foul Odor After Cleansing: No Slough/Fibrino Yes Wound Bed Granulation Amount: Large (67-100%) Exposed Structure Granulation Quality: Pink Fascia Exposed: No Necrotic Amount: Small (1-33%) Fat Layer (Subcutaneous Tissue) Exposed: Yes Necrotic Quality: Adherent Slough Tendon Exposed: No Muscle Exposed: No Joint Exposed: No Bone Exposed: No Treatment Notes Wound #6 (Left, Lateral Lower  Leg) Notes Bilateral: alginate, ABD, 4 layer TCA Electronic Signature(s) EPIFANIA, CUCINOTTA ([id number]) Signed: 02/10/2020 2:25:34 PM By: Army Melia Entered By: Army Melia on 02/10/2020 13:23:18 Dena, [name] -------------------------------------------------------------------------------- Wound Assessment Details Patient Name: AMIA, DUPERE. Date of Service: 02/10/2020 1:15 PM Medical Record Number: [id number] Patient Account Number: [id number] Date of Birth/Sex: July 15, 1946 (74 y.o. F) Treating RN: Army Melia Primary Care Urijah Arko: [name] Other Clinician: Referring Menna Abeln: [name] Treating Domino Holten/Extender: [name] in Treatment: 61 Wound Status Wound Number: 9 Primary Venous Leg Ulcer Etiology: Wound Location: Left, Lateral, Posterior Lower Leg Wound Open Wounding Event: Gradually Appeared Status: Date Acquired: 07/08/2019 Comorbid Cataracts, Asthma, Sleep Apnea, Deep Vein Thrombosis, Weeks Of Treatment: 31 History: Hypertension, Peripheral Venous Disease, Osteoarthritis, Clustered Wound: No Received Chemotherapy, Received Radiation Photos Wound Measurements Length: (cm) 0.2 Width: (cm) 0.2 Depth: (cm) 0.1 Area: (cm) 0.031 Volume: (cm) 0.003 % Reduction in Area: 94.5% % Reduction in Volume: 94.7% Epithelialization: Medium (34-66%) Wound Description Classification: Full Thickness Without Exposed Support Struct Exudate Amount: Medium Exudate Type: Serosanguineous Exudate Color: red, brown ures Foul Odor After Cleansing: No Slough/Fibrino Yes Wound Bed Granulation Amount: Large (67-100%) Exposed Structure Granulation Quality: Pink Fascia Exposed: No Necrotic Amount: Small (1-33%) Fat Layer (Subcutaneous Tissue) Exposed: Yes Necrotic Quality: Adherent Slough Tendon Exposed: No Muscle Exposed: No Joint Exposed: No Bone Exposed: No Treatment Notes Wound #9 (Left, Lateral, Posterior Lower  Leg) Notes Bilateral: alginate, ABD, 4 layer TCA Electronic Signature(s) Signed: 02/10/2020 2:25:34 PM By: Renee Rival ([id number]) Entered By: Army Melia on 02/10/2020 13:23:51 Weedman, [name] -------------------------------------------------------------------------------- Vitals Details Patient Name: YENTL, RUDESILL. Date of Service: 02/10/2020 1:15 PM Medical Record Number: [id number] Patient Account Number: [id number] Date of Birth/Sex: 05/14/1946 (74 y.o. F) Treating RN: [name] Primary Care Marbin Olshefski: [name] Other Clinician: Referring Cimone Fahey: [name] Treating Hera Celaya/Extender: [name] in Treatment: 61 Vital Signs Time Taken: 13:10 Temperature (F): 98.7 Height (in): 63 Pulse (bpm): 83 Weight (lbs): 224.7 Respiratory Rate (breaths/min): 18 Body Mass  Index (BMI): 39.8 Blood Pressure (mmHg): 137/69 Reference Range: 80 - 120 mg / dl Electronic Signature(s) Signed: 02/10/2020 3:46:59 PM By: [name] RCP, RRT, CHT Entered By: Becky Sax, Amado Nash on 02/10/2020 13:11:22

## 2020-02-12 NOTE — Progress Notes (Addendum)
ADILEE, CARDINAL ([id number]) Visit Report for 02/12/2020 Arrival Information Details Patient Name: [name], [name]. Date of Service: 02/12/2020 1:45 PM Medical Record Number: [id number] Patient Account Number: [id number] Date of Birth/Sex: 26-Aug-1946 (74 y.o. F) Treating RN: Cornell Barman Primary Care Stacy Sailer: [name] Other Clinician: Referring Yaneth Fairbairn: [name] Treating Ashanti Ratti/Extender: Melburn Hake, HOYT Weeks in Treatment: 59 Visit Information History Since Last Visit Added or deleted any medications: No Patient Arrived: Ambulatory Has Dressing in Place as Prescribed: Yes Arrival Time: 14:55 Has Compression in Place as Prescribed: Yes Accompanied By: self Pain Present Now: No Transfer Assistance: None Patient Requires Transmission-Based No Precautions: Patient Has Alerts: Yes Patient Alerts: Patient on Blood Thinner aspirin 81 Notes Patient returns today for wraps to be re-applied following a vascular procedure. Electronic Signature(s) Signed: 02/12/2020 2:56:11 PM By: [name], [name], RN, CWS, [name], [name] Entered By: [name], [name], RN, CWS, Kim on 02/12/2020 14:56:10 LESHAE, EID ([id number]) -------------------------------------------------------------------------------- Compression Therapy Details Patient Name: [name]. Date of Service: 02/12/2020 1:45 PM Medical Record Number: [id number] Patient Account Number: [id number] Date of Birth/Sex: June 03, 1946 (74 y.o. F) Treating RN: Cornell Barman Primary Care Samit Sylve: [name] Other Clinician: Referring Avree Szczygiel: [name] Treating Verlia Kaney/Extender: Melburn Hake, HOYT Weeks in Treatment: 61 Compression Therapy Performed for Wound Assessment: Wound #10 Left,Proximal,Lateral Lower Leg Performed By: Clinician Cornell Barman, RN Compression Type: Four Layer Pre Treatment ABI: 1 Electronic Signature(s) Signed: 02/12/2020 2:58:07 PM By: [name], [name], RN, CWS, [name], [name] Entered By: [name], [name], RN, CWS, Kim on  02/12/2020 14:58:07 [name] -------------------------------------------------------------------------------- Compression Therapy Details Patient Name: [name]. Date of Service: 02/12/2020 1:45 PM Medical Record Number: [id number] Patient Account Number: [id number] Date of Birth/Sex: 25-Sep-1946 (74 y.o. F) Treating RN: Cornell Barman Primary Care Elanda Garmany: [name] Other Clinician: Referring Oneal Biglow: [name] Treating Braylen Staller/Extender: Melburn Hake, HOYT Weeks in Treatment: 61 Compression Therapy Performed for Wound Assessment: Wound #11 Right,Medial Lower Leg Performed By: Clinician Cornell Barman, RN Compression Type: Four Layer Pre Treatment ABI: 1 Electronic Signature(s) Signed: 02/12/2020 2:58:08 PM By: [name], [name], RN, CWS, [name], [name] Entered By: [name], [name], RN, CWS, Kim on 02/12/2020 14:58:08 [name] -------------------------------------------------------------------------------- Compression Therapy Details Patient Name: [name]. Date of Service: 02/12/2020 1:45 PM Medical Record Number: [id number] Patient Account Number: [id number] Date of Birth/Sex: 1946/03/27 (74 y.o. F) Treating RN: Cornell Barman Primary Care Auna Mikkelsen: [name] Other Clinician: Referring Takuma Cifelli: [name] Treating [place] Mckeough/Extender: Melburn Hake, HOYT Weeks in Treatment: 61 Compression Therapy Performed for Wound Assessment: Wound #5 Left,Medial Lower Leg Performed By: Clinician Cornell Barman, RN Compression Type: Four Layer Pre Treatment ABI: 1 Electronic Signature(s) Signed: 02/12/2020 2:58:08 PM By: [name], [name], RN, CWS, [name], [name] Entered By: [name], [name], RN, CWS, Kim on 02/12/2020 14:58:08 [name] -------------------------------------------------------------------------------- Compression Therapy Details Patient Name: [name]. Date of Service: 02/12/2020 1:45 PM Medical Record Number: [id number] Patient  Account Number: [id number] Date of Birth/Sex: [birth year] (74 y.o. F) Treating RN: Cornell Barman Primary Care Isa Hitz: [name] Other Clinician: Referring Vernee Baines: [name] Treating Jeromey Kruer/Extender: Melburn Hake, HOYT Weeks in Treatment: 61 Compression Therapy Performed for Wound Assessment: Wound #6 Left,Lateral Lower Leg Performed By: Clinician Cornell Barman, RN Compression Type: Four Layer Pre Treatment ABI: 1 Electronic Signature(s) Signed: 02/12/2020 2:58:09 PM By: [name], [name], RN, CWS, [name], [name] Entered By: [name], [name], RN, CWS, Kim on 02/12/2020 14:58:08 [name] -------------------------------------------------------------------------------- Compression Therapy Details Patient Name: [name].  Date of Service: 02/12/2020 1:45 PM Medical Record Number: [id number] Patient Account Number: [id number] Date of Birth/Sex: 1945-12-17 (74 y.o. F) Treating RN: Cornell Barman Primary Care Trevaun Rendleman: [name] Other Clinician: Referring Marisol Giambra: [name] Treating Simaya Lumadue/Extender: Melburn Hake, HOYT Weeks in Treatment: 61 Compression Therapy Performed for Wound Assessment: Wound #9 Left,Lateral,Posterior Lower Leg Performed By: Clinician Cornell Barman, RN Compression Type: Four Layer Pre Treatment ABI: 1 Electronic Signature(s) Signed: 02/12/2020 2:58:09 PM By: [name], [name], RN, CWS, [name], [name] Entered By: [name], [name], RN, CWS, Kim on 02/12/2020 14:58:09 [name] -------------------------------------------------------------------------------- Wound Assessment Details Patient Name: [name]. Date of Service: 02/12/2020 1:45 PM Medical Record Number: [id number] Patient Account Number: [id number] Date of Birth/Sex: 10-15-1946 (74 y.o. F) Treating RN: Cornell Barman Primary Care Tareva Leske: [name] Other Clinician: Referring Weslee Fogg: [name] Treating Fermon Ureta/Extender: Melburn Hake, HOYT Weeks in Treatment:  61 Wound Status Wound Number: 10 Primary Etiology: Venous Leg Ulcer Wound Location: Left, Proximal, Lateral Lower Leg Wound Status: Open Wounding Event: Blister Date Acquired: 01/27/2020 Weeks Of Treatment: 2 Clustered Wound: No Wound Measurements Length: (cm) 0.1 Width: (cm) 0.1 Depth: (cm) 0.1 Area: (cm) 0.008 Volume: (cm) 0.001 % Reduction in Area: 99.3% % Reduction in Volume: 99.1% Wound Description Classification: Full Thickness Without Exposed Support Structure s Electronic Signature(s) Signed: 02/15/2020 8:41:26 AM By: [name], [name], RN, CWS, [name], [name] Entered By: [name], [name], RN, CWS, Kim on 02/12/2020 14:57:34 Brandes, [name] -------------------------------------------------------------------------------- Wound Assessment Details Patient Name: SHAKIA, BRUCH. Date of Service: 02/12/2020 1:45 PM Medical Record Number: [id number] Patient Account Number: [id number] Date of Birth/Sex: July 20, 1946 (74 y.o. F) Treating RN: Cornell Barman Primary Care Nashae Maudlin: [name] Other Clinician: Referring Jorgeluis Gurganus: [name] Treating Dayani Winbush/Extender: Melburn Hake, HOYT Weeks in Treatment: 61 Wound Status Wound Number: 11 Primary Etiology: Venous Leg Ulcer Wound Location: Right, Medial Lower Leg Wound Status: Open Wounding Event: Gradually Appeared Date Acquired: 02/04/2020 Weeks Of Treatment: 1 Clustered Wound: No Wound Measurements Length: (cm) 1 Width: (cm) 0.7 Depth: (cm) 0.1 Area: (cm) 0.55 Volume: (cm) 0.055 % Reduction in Area: 29.9% % Reduction in Volume: 30.4% Wound Description Classification: Full Thickness Without Exposed Support Structure s Electronic Signature(s) Signed: 02/15/2020 8:41:26 AM By: [name], [name], RN, CWS, [name], [name] Entered By: [name], [name], RN, CWS, Kim on 02/12/2020 14:57:34 Worden, [name] -------------------------------------------------------------------------------- Wound Assessment Details Patient Name:  DEVLYNN, PEARL. Date of Service: 02/12/2020 1:45 PM Medical Record Number: [id number] Patient Account Number: [id number] Date of Birth/Sex: [birth year] (74 y.o. F) Treating RN: Cornell Barman Primary Care Ashad [name]: [name] Other Clinician: Referring Augie Vane: [name] Treating Jericho Cieslik/Extender: Melburn Hake, HOYT Weeks in Treatment: 61 Wound Status Wound Number: 5 Primary Etiology: Lymphedema Wound Location: Left, Medial Lower Leg Wound Status: Open Wounding Event: Gradually Appeared Date Acquired: 11/19/2018 Weeks Of Treatment: 61 Clustered Wound: No Wound Measurements Length: (cm) 1.1 Width: (cm) 0.6 Depth: (cm) 0.1 Area: (cm) 0.518 Volume: (cm) 0.052 % Reduction in Area: 93.9% % Reduction in Volume: 93.9% Wound Description Classification: Full Thickness Without Exposed Support Structure s Electronic Signature(s) Signed: 02/15/2020 8:41:26 AM By: [name], [name], RN, CWS, [name], [name] Entered By: [name], [name], RN, CWS, Kim on 02/12/2020 14:57:34 [place], [name] -------------------------------------------------------------------------------- Wound Assessment Details Patient Name: JEHLANI, BREARLEY. Date of Service: 02/12/2020 1:45 PM Medical Record Number: [id number] Patient Account Number: [id number] Date of Birth/Sex: [birth year] (74 y.o. F) Treating RN: Cornell Barman Primary Care Latesa Fratto: [name] Other Clinician: Referring Jasman Pfeifle: [name] Treating  Lottie Sigman/Extender: Melburn Hake, HOYT Weeks in Treatment: 61 Wound Status Wound Number: 6 Primary Etiology: Venous Leg Ulcer Wound Location: Left, Lateral Lower Leg Wound Status: Open Wounding Event: Gradually Appeared Date Acquired: 01/19/2019 Weeks Of Treatment: 55 Clustered Wound: No Wound Measurements Length: (cm) 1.2 Width: (cm) 1 Depth: (cm) 0.1 Area: (cm) 0.942 Volume: (cm) 0.094 % Reduction in Area: -328.2% % Reduction in Volume: -327.3% Wound Description Classification: Full  Thickness Without Exposed Support Structure s Electronic Signature(s) Signed: 02/15/2020 8:41:26 AM By: [name], [name], RN, CWS, [name], [name] Entered By: [name], [name], RN, CWS, Kim on 02/12/2020 14:57:35 Gundry, [name] ([id number]) -------------------------------------------------------------------------------- Wound Assessment Details Patient Name: BUNITA, STLAURENT. Date of Service: 02/12/2020 1:45 PM Medical Record Number: [id number] Patient Account Number: [id number] Date of Birth/Sex: [birth year] (74 y.o. F) Treating RN: Cornell Barman Primary Care Aimee Heldman: [name] Other Clinician: Referring Michell Giuliano: [name] Treating Hristopher Missildine/Extender: Melburn Hake, HOYT Weeks in Treatment: 61 Wound Status Wound Number: 9 Primary Etiology: Venous Leg Ulcer Wound Location: Left, Lateral, Posterior Lower Leg Wound Status: Open Wounding Event: Gradually Appeared Date Acquired: 07/08/2019 Weeks Of Treatment: 31 Clustered Wound: No Wound Measurements Length: (cm) 0.2 Width: (cm) 0.2 Depth: (cm) 0.1 Area: (cm) 0.031 Volume: (cm) 0.003 % Reduction in Area: 94.5% % Reduction in Volume: 94.7% Wound Description Classification: Full Thickness Without Exposed Support Structure s Electronic Signature(s) Signed: 02/15/2020 8:41:26 AM By: [name], [name], RN, CWS, [name], [name] Entered By: [name], [name], RN, CWS, Kim on 02/12/2020 14:57:35

## 2020-02-16 DIAGNOSIS — Z23 Encounter for immunization: Secondary | ICD-10-CM | POA: Diagnosis not present

## 2020-02-17 ENCOUNTER — Encounter: Payer: Medicare Other | Admitting: Internal Medicine

## 2020-02-17 ENCOUNTER — Other Ambulatory Visit: Payer: Self-pay

## 2020-02-17 DIAGNOSIS — L97211 Non-pressure chronic ulcer of right calf limited to breakdown of skin: Secondary | ICD-10-CM | POA: Diagnosis not present

## 2020-02-17 DIAGNOSIS — I890 Lymphedema, not elsewhere classified: Secondary | ICD-10-CM | POA: Diagnosis not present

## 2020-02-17 DIAGNOSIS — L97221 Non-pressure chronic ulcer of left calf limited to breakdown of skin: Secondary | ICD-10-CM | POA: Diagnosis not present

## 2020-02-17 DIAGNOSIS — L97222 Non-pressure chronic ulcer of left calf with fat layer exposed: Secondary | ICD-10-CM | POA: Diagnosis not present

## 2020-02-17 DIAGNOSIS — G4733 Obstructive sleep apnea (adult) (pediatric): Secondary | ICD-10-CM | POA: Diagnosis not present

## 2020-02-17 DIAGNOSIS — E876 Hypokalemia: Secondary | ICD-10-CM | POA: Diagnosis not present

## 2020-02-17 DIAGNOSIS — I872 Venous insufficiency (chronic) (peripheral): Secondary | ICD-10-CM | POA: Diagnosis not present

## 2020-02-17 NOTE — Progress Notes (Signed)
SINIYAH, [name] Visit Report for 01/27/2020 Debridement Details Patient Name: [name], [name]. Date of Service: 01/27/2020 12:45 PM Medical Record Number: [id number] Patient Account Number: [id number] Date of Birth/Sex: [birth year] (74 y.o. F) Treating RN: Cornell Barman Primary Care Provider: [name] Other Clinician: Referring Provider: [name] Treating Provider/Extender: Tito Dine in Treatment: 25 Debridement Performed for Wound #5 Left,Medial Lower Leg Assessment: Performed By: Physician [name] Dillon, MD Debridement Type: Debridement Level of Consciousness (Pre- Awake and Alert procedure): Pre-procedure Verification/Time Out Yes - 13:14 Taken: Start Time: 13:14 Pain Control: Lidocaine Total Area Debrided (L x W): 1.4 (cm) x 1.2 (cm) = 1.68 (cm) Tissue and other material debrided: Viable, Non-Viable, Slough, Subcutaneous, Slough Level: Skin/Subcutaneous Tissue Debridement Description: Excisional Instrument: Curette Bleeding: Minimum Hemostasis Achieved: Pressure End Time: 13:16 Response to Treatment: Procedure was tolerated well Level of Consciousness (Post- Awake and Alert procedure): Post Debridement Measurements of Total Wound Length: (cm) 1.4 Width: (cm) 1.2 Depth: (cm) 0.3 Volume: (cm) 0.396 Character of Wound/Ulcer Post Debridement: Stable Post Procedure Diagnosis Same as Pre-procedure Electronic Signature(s) Signed: 01/28/2020 4:13:18 AM By: Linton Ham MD Signed: 02/17/2020 11:11:47 AM By: [name], [name], RN, CWS, [name], [name] Entered By: Linton Ham on 01/27/2020 13:28:58 [name], [name] -------------------------------------------------------------------------------- Debridement Details Patient Name: [name]. Date of Service: 01/27/2020 12:45 PM Medical Record Number: [id number] Patient Account Number: [id number] Date of Birth/Sex: [birth year] (74 y.o. F) Treating RN: Cornell Barman Primary  Care Provider: [name] Other Clinician: Referring Provider: [name] Treating Provider/Extender: Tito Dine in Treatment: 72 Debridement Performed for Wound #6 Left,Lateral Lower Leg Assessment: Performed By: Physician [name] Dillon, MD Debridement Type: Debridement Severity of Tissue Pre Debridement: Fat layer exposed Level of Consciousness (Pre- Awake and Alert procedure): Pre-procedure Verification/Time Out Yes - 13:14 Taken: Start Time: 13:14 Pain Control: Lidocaine Total Area Debrided (L x W): 3.6 (cm) x 1.7 (cm) = 6.12 (cm) Tissue and other material debrided: Viable, Non-Viable, Slough, Subcutaneous, Slough Level: Skin/Subcutaneous Tissue Debridement Description: Excisional Instrument: Curette Bleeding: Minimum Hemostasis Achieved: Pressure End Time: 13:16 Response to Treatment: Procedure was tolerated well Level of Consciousness (Post- Awake and Alert procedure): Post Debridement Measurements of Total Wound Length: (cm) 3.6 Width: (cm) 1.7 Depth: (cm) 0.2 Volume: (cm) 0.961 Character of Wound/Ulcer Post Debridement: Stable Severity of Tissue Post Debridement: Fat layer exposed Post Procedure Diagnosis Same as Pre-procedure Electronic Signature(s) Signed: 01/28/2020 4:13:18 AM By: Linton Ham MD Signed: 02/17/2020 11:11:47 AM By: [name], [name], RN, CWS, [name], [name] Entered By: Linton Ham on 01/27/2020 13:29:17 [name], [name] -------------------------------------------------------------------------------- HPI Details Patient Name: [name], [name]. Date of Service: 01/27/2020 12:45 PM Medical Record Number: [id number] Patient Account Number: [id number] Date of Birth/Sex: 06-22-1946 (74 y.o. F) Treating RN: Cornell Barman Primary Care Provider: [name] Other Clinician: Referring Provider: [name] Treating Provider/Extender: Tito Dine in Treatment: 66 History of Present  Illness HPI Description: Pleasant 74 year old with history of chronic venous insufficiency. No diabetes or peripheral vascular disease. Left ABI 1.29. Questionable history of left lower extremity DVT. She developed a recurrent ulceration on her left lateral calf in December 2015, which she attributes to poor diet and subsequent lower extremity edema. She underwent endovenous laser ablation of her left greater saphenous vein in 2010. She underwent laser ablation of accessory branch of left GSV in April 2016 by Dr. Kellie Simmering at [facility]. She was previously wearing Unna boots, which she tolerated well. Tolerating 2 layer compression  and cadexomer iodine. She returns to clinic for follow-up and is without new complaints. She denies any significant pain at this time. She reports persistent pain with pressure. No claudication or ischemic rest pain. No fever or chills. No drainage. READMISSION 11/13/16; this is a 74 year old woman who is not a diabetic. She is here for a review of a painful area on her left medial lower extremity. I note that she was seen here previously last year for wound I believe to be in the same area. At that time she had undergone previously a left greater saphenous vein ablation by Dr. Kellie Simmering and she had a ablation of the anterior accessory branch of the left greater saphenous vein in March 2016. Seeing that the wound actually closed over. In reviewing the history with her today the ulcer in this area has been recurrent. She describes a biopsy of this area in 2009 that only showed stasis physiology. 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 Do not see that she has ever had arterial studies in the left leg. She tells me she has a follow-up with Dr. Kellie Simmering in roughly 10  days In any case she developed the reopening of this area roughly a month ago. On the background of this she describes rapidly increasing edema which has responded to Lasix 40 mg and metolazone 2.5 mg as well as the patient's lymph massage. She has been told she has both venous insufficiency and lymphedema but she cannot tolerate compression stockings 11/28/16; the patient saw Dr. Kellie Simmering recently. Per the patient he did arterial Dopplers in the office that did not show evidence of arterial insufficiency, per the patient he stated "treat this like an ordinary venous ulcer". She also saw her dermatologist Dr. Ronnald Ramp who felt that this was more of a vascular ulcer. In general things are improving although she arrives today with increasing bilateral lower extremity edema with weeping a deeper fluid through the wound on the left medial leg compatible with some degree of lymphedema 12/04/16; the patient's wound is fully epithelialized but I don't think fully healed. We will do another week of depression with Promogran and TCA however I suspect we'll be able to discharge her next week. This is a very unusual-looking wound which was initially a figure-of-eight type wound lying on its side surrounded by petechial like hemorrhage. She has had venous ablation on this side. She apparently does not have an arterial issue per Dr. Kellie Simmering. She saw her dermatologist thought it was "vascular". Patient is definitely going to need ongoing compression and I talked about this with her today she will go to elastic therapy after she leaves here next week 12/11/16; the patient's wound is not completely closed today. She has surrounding scar tissue and in further discussion with the patient it would appear that she had ulcers in this area in 2009 for a prolonged period of time ultimately requiring a punch biopsy of this area that only showed venous insufficiency. I did not previously pickup on this part of the history from the  patient. 12/18/16; the patient's wound is completely epithelialized. There is no open area here. She has significant bilateral venous insufficiency with secondary lymphedema to a mild-to-moderate degree she does not have compression stockings.. She did not say anything to me  when I was in the room, she told our intake nurse that she was still having pain in this area. This isn't unusual recurrent small open area. She is going to go to elastic therapy to obtain compression stockings. 12/25/16; the patient's wound is fully epithelialized. There is no open area here. The patient describes some continued episodic discomfort in this area medial left calf. However everything looks fine and healed here. She is been to elastic therapy and caught herself 15-20 mmHg stockings, they apparently were having trouble getting 20-30 mm stockings in her size 01/22/17; this is a patient we discharged from the clinic a month ago. She has a recurrent open wound on her medial left calf. She had 15 mm support stockings. I told her I thought she needed 20-30 mm compression stockings. She tells me that she has been ill with hospitalization secondary to asthma and is been found to have severe hypokalemia likely secondary to a combination of Lasix and metolazone. This morning she noted blistering and leaking fluid on the posterior part of her left leg. She called our intake nurse urgently and we was saw her this afternoon. She has not had any real discomfort here. I don't know that she's been wearing any stockings on this leg for at least 2-3 days. ABIs in this clinic were 1.21 on the right and 1.3 on the left. She is previously seen vascular surgery who does not think that there is a peripheral arterial issue. 01/30/17; Patient arrives with no open wound on the left leg. She has been to elastic therapy and obtained 20-35mmhg below knee stockings and she has one on the right leg today. READMISSION 02/19/18; this Coots is a now  74 year old patient we've had in this clinic perhaps 3 times before. I had last looked at her from January 07 December 2016 with an area on the medial left leg. We discharged her on 12/25/16 however she had to be readmitted on 01/22/17 with a recurrence. I have in my notes that we discharged her on 20-30 mm stockings although she tells me she was only wearing support hose because she cannot get stockings on predominantly related to her cervical spine surgery/issues. She has had previous ablations done by vein and vascular in [place] including a great saphenous vein ablation on the left with an anterior accessory branch ablation I think both of these were in 2016. On one of the previous visit she had a biopsy noted 2009 that was negative. She is not felt to have an arterial issue. She is not a diabetic. She does have a history of obstructive sleep apnea hypertension asthma as well as chronic venous insufficiency and lymphedema. [name], [name] On this occasion she noted 2 dry scaly patch on her left leg. She tried to put lotion on this it didn't really help. There were 2 open areas.the patient has been seeing her primary physician from 02/05/18 through 02/14/18. She had Unna boots applied. The superior wound now on the lateral left leg has closed but she's had one wound that remains open on the lateral left leg. This is not the same spot as we dealt with in 2018. ABIs in this clinic were 1.3 bilaterally 02/26/18; patient has a small wound on the left lateral calf. Dimensions are down. She has chronic venous insufficiency and lymphedema. 03/05/18; small open area on the left lateral calf. Dimensions are down. Tightly adherent necrotic debris over the surface of the wound which was difficult to remove. Also the dressing [  over collagen] stuck to the wound surface. This was removed with some difficulty as well. Change the primary dressing to Hydrofera Blue ready 03/12/18; small open area on the left  lateral calf. Comes in with tightly adherent surface eschar as well as some adherent Hydrofera Blue. 03/19/18; open area on the left lateral calf. Again adherent surface eschar as well as some adherent Hydrofera Blue nonviable subcutaneous tissue. She complained of pain all week even with the reduction from 4-3 layer compression I put on last week. Also she had an increase in her ankle and calf measurements probably related to the same thing. 03/26/18; open area on the left lateral calf. A very small open area remains here. We used silver alginate starting last week as the Hydrofera Blue seem to stick to the wound bed. In using 4-layer compression 04/02/18; the open area in the left lateral calf at some adherent slough which I removed there is no open area here. We are able to transition her into her own compression stocking. Truthfully I think this is probably his support hose. However this does not maintain skin integrity will be limited. She cannot put over the toe compression stockings on because of neck problems hand problems etc. She is allergic to the lining layer of juxta lites. We might be forced to use extremitease stocking should this fail READMIT 11/24/2018 Patient is now a 74 year old woman who is not a diabetic. She has been in this clinic on at least 3 previous occasions largely with recurrent wounds on her left leg secondary to chronic venous insufficiency with secondary lymphedema. Her situation is complicated by inability to get stockings on and an allergy to neoprene which is apparently a component and at least juxta lites and other stockings. As a result she really has not been wearing any stockings on her legs. She tells [place] that roughly 2 or 3 weeks ago she started noticing a stinging sensation just above her ankle on the left medial aspect. She has been diagnosed with pseudogout and she wondered whether this was what she was experiencing. She tried to dress this with something she  bought at the store however subsequently it pulled skin off and now she has an open wound that is not improving. She has been using Vaseline gauze with a cover bandage. She saw her primary doctor last week who put an Haematologist on her. ABIs in this clinic was 1.03 on the left 2/12; the area is on the left medial ankle. Odd-looking wound with what looks to be surface epithelialization but a multitude of small petechial openings. This clearly not closed yet. We have been using silver alginate under 3 layer compression with TCA 2/19; the wound area did not look quite as good this week. Necrotic debris over the majority of the wound surface which required debridement. She continues to have a multitude of what looked to be small petechial openings. She reminds [place] that she had a biopsy on this initially during her first outbreak in 2015 in [place] dermatology. She expresses concern about this being a possible melanoma. 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 I assured her that this does not look anything like melanoma. Except for the petechial reaction it does look like a venous insufficiency area and she certainly has evidence of this on both sides 2/26; a difficult area on the left medial ankle. The patient clearly has chronic venous hypertension with some  degree of lymphedema. The odd thing about the area is the small petechial hemorrhages. I am not really sure how to explain this. This was present last time and this is not a compression injury. We have been using Hydrofera Blue which I changed to last week 3/4; still using Hydrofera Blue. Aggressive debridement today. She does not have known arterial issues. She has seen Dr. Kellie Simmering at [facility] vein and vascular and and has an ablation on the left. [Anterior accessory branch of the greater saphenous]. From what I remember they did not feel she had an arterial  issue. The patient has had this area biopsied in 2009 at [facility] and by her recollection they said this was "stasis". She is also follow-up with dermatology locally who thought that this was more of a vascular issue 3/11; using Hydrofera Blue. Aggressive debridement today. She does not have an arterial issue. We are using 3 layer compression although we may need to go to 4. The patient has been in for multiple changes to her wrap since I last saw her a week ago. She says that the area was leaking. I do not have too much more information on what was found 01/19/19 on evaluation today patient was actually being seen for a nurse visit when unfortunately she had the area on her left lateral lower extremity as well as weeping from the right lower extremity that became apparent. Therefore we did end up actually seeing her for a full visit with myself. She is having some pain at this site as well but fortunately nothing too significant at this point. No fevers, chills, nausea, or vomiting noted at this time. 3/18-Patient is back to the clinic with the left leg venous leg ulcer, the ulcer is larger in size, has a surface that is densely adherent with fibrinous tissue, the Hydrofera Blue was used but is densely adherent and there was difficulty in removing it. The right lower extremity was also wrapped for weeping edema. Patient has a new area over the left lateral foot above the malleolus that is small and appears to have no debris with intact surrounding skin. Patient is on increased dose of Lasix also as a means to edema management 3/25; the patient has a nonhealing venous ulcer on the medial left leg and last week developed a smaller area on the lateral left calf. We have been using Hydrofera Blue with a contact layer. 4/1; no major change in these wounds areas. Left medial and more recently left lateral calf. I tried Iodoflex last week to aid in debridement she did not tolerate this. She  stated her pain was terrible all week. She took the top layer of the 4 layer compression off. 4/8; the patient actually looks somewhat better in terms of her more prominent left lateral calf wound. There is some healthy looking tissue here. She is still complaining of a lot of discomfort. 4/15; patient in a lot of pain secondary to sciatica. She is on a prednisone taper prescribed by her primary physician. She has the 2 areas one on the left medial and more recently a smaller area on the left lateral calf. Both of these just above the malleoli 4/22; her back pain is better but she still states she is very uncomfortable and now feels she is intolerant to the [employer]. No real change in the wounds we have been using Sorbact. She has been previously intolerant to Iodoflex. There is not a lot of option about what we can use  to debride this wound under compression that she no doubt needs. sHe states Ultram no longer works for her pain [name], [name] 4/29; no major change in the wounds slightly increased depth. Surface on the original medial wound perhaps somewhat improved however the more recent area on the lateral left ankle is 100% covered in very adherent debris we have been using Sorbact. She tolerates 4 layer compression well and her edema control is a lot better. She has not had to come in for a nurse check 5/6; no major change in the condition of the wounds. She did consent to debridement today which was done with some difficulty. Continuing Sorbact. She did not tolerate Iodoflex. She was in for a check of her compression the day after we wrapped her last week this was adjusted but nothing much was found 5/13; no major change in the condition or area of the wounds. I was able to get a fairly aggressive debridement done on the lateral left leg wound. Even using Sorbact under compression. She came back in on Friday to have the wrap changed. She says she felt uncomfortable on the  lateral aspect of her ankle. She has a long history of chronic venous insufficiency including previous ablation surgery on this side. 5/20-Patient returns for wounds on left leg with both wounds covered in slough, with the lateral leg wound larger in size, she has been in 3 layer compression and felt more comfortable, she describes pain in ankle, in leg and pins and needles in foot, and is about to try Pamelor for this 6/3; wounds on the left lateral and left medial leg. The area medially which is the most recent of the 2 seems to have had the largest increase in dimensions. We have been using Sorbac to try and debride the surface. She has been to see orthopedics they apparently did a plain x-ray that was indeterminant. Diagnosed her with neuropathy and they have ordered an MRI to determine if there is underlying osteomyelitis. This was not high on my thought list but I suppose it is prudent. We have advised her to make an appointment with vein and vascular in [place]. She has a history of a left greater saphenous and accessory vein ablations I wonder if there is anything else that can be done from a surgical point of view to help in these difficult refractory wounds. We have previously healed this wound on one occasion but it keeps on reopening [medial side] 6/10; deep tissue culture I did last week I think on the left medial wound showed both moderate E. coli and moderate staph aureus [MSSA]. She is going to require antibiotics and I have chosen Augmentin. We have been using Sorbact and we have made better looking wound surface on both sides but certainly no improvement in wound area. She was back in last Friday apparently for a dressing changes the wrap was hurting her outer left ankle. She has not managed to get a hold of vein and vascular in [place]. We are going to have to make her that appointment 6/17; patient is tolerating the Augmentin. She had an MRI that I think was ordered by  orthopedic surgeon this did not show osteomyelitis or an abscess did suggest cellulitis. We have been using Sorbact to the lateral and medial ankles. We have been trying to arrange a follow-up appointment with vein and vascular in [place] or did her original ablations. We apparently an area sent the request to vein and vascular in [facility]  6/24; patient has completed the Augmentin. We do not yet have a vein and vascular appointment in [place]. I am not sure what the issue is here we have asked her to call tomorrow. We are using Sorbact. Making some improvements and especially the medial wound. Both surfaces however look better medial and lateral. 7/1; the patient has been in contact with vein and vascular in [place] but has not yet received an appointment. Using Sorbact we have gradually improve the wound surface with no improvement in surface area. She is approved for Apligraf but the wound surface still is not completely viable. She has not had to come in for a dressing change 7/8; the patient has an appointment with vein and vascular on 7/31 which is a Friday afternoon. She is concerned about getting back here for [place] to dress her wounds. I think it is important to have them goal for her venous reflux/history of ablations etc. to see if anything else can be done. She apparently tested positive for 1 of the blood tests with regards to lupus and saw a rheumatologist. He has raised the issue of vasculitis again. I have had this thought in the past however the evidence seems overwhelming that this is a venous reflux etiology. If the rheumatologist tells me there is clinical and laboratory investigation is positive for lupus I will rethink this. 7/15; the patient's wound surfaces are quite a bit better. The medial area which was her original wound now has no depth although the lateral wound which was the more recent area actually appears larger. Both with viable surfaces which is indeed  better. Using Sorbact. I wanted to use Apligraf on her however there is the issue of the vein and vascular appointment on 7/31 at 2:00 in the afternoon which would not allow her to get back to be rewrapped and they would no doubt remove the graft 7/22; the patient's wound surfaces have moderate amount of debris although generally look better. The lateral one is larger with 2 small satellite areas superiorly. We are waiting for her vein and vascular appointment on 7/31. She has been approved for Apligraf which I would like to use after th 7/29; wound surfaces have improved no debridement is required we have been using Sorbact. She sees vein and vascular on Friday with this so question of whether anything can be done to lessen the likelihood of recurrence and/or speed the healing of these areas. She is already had previous ablations. She no doubt has severe venous hypertension 8/5-Patient returns at 1 week, she was in [place] for 3 days by her podiatrist, we have been using so backed to the wound, she has increased pain in both the wounds on the left lower leg especially the more distal one on the lateral aspect 8/12-Patient returns at 1 week and she is agreeable to having debridement in both wounds on her left leg today. We have been using Sorbact, and vascular studies were reviewed at last visit 8/19; the patient arrives with her wounds fairly clean and no debridement is required. We have used Sorbact which is really done a nice job in cleaning up these very difficult wound surfaces. The patient saw Dr. Donzetta Matters of vascular surgery on 7/31. He did not feel that there was an arterial component. He felt that her treated greater saphenous vein is adequately addressed and that the small saphenous vein did not appear to be involved significantly. She was also noted to have deep venous reflux which is not treatable.  Dr. Donzetta Matters mentioned the possibility of a central obstructive component leading to reflux and  he offered her central venography. She wanted to discuss this or think about it. I have urged her to go ahead with this. She has had recurrent difficult wounds in these areas which do heal but after months in the clinic. If there is anything that can be done to reduce the likelihood of this I think it is worth it. 9/2 she is still working towards getting follow-up with Dr. Donzetta Matters to schedule her CT. Things are quite a bit worse venography. I put Apligraf on 2 weeks ago on both wounds on the medial and lateral part of her left lower leg. She arrives in clinic today with 3 superficial additional wounds above the area laterally and one below the wound medially. She describes a lot of discomfort. I think these are probably wrapped injuries. Does not look like she has cellulitis. 07/20/2019 on evaluation today patient appears to be doing somewhat poorly in regard to her lower extremity ulcers. She in fact showed signs of erythema in fact we may even be dealing with an infection at this time. Unfortunately I am unsure if this is just infection or if indeed there may be some allergic reaction that occurred as a result of the Apligraf application. With that being said that would be unusual but nonetheless not impossible in this patient is one who is unfortunately allergic to quite a bit. Currently we have been using the Sorbact which seems to do as well as anything for her. I do think we may want to obtain a culture today to see if there is anything showing up there that may need to be addressed. 9/16; noted that last week the wounds look worse in 1 week follow-up of the Apligraf. Using Sorbact as of 2 days ago. She arrives with copious amounts of drainage and new skin breakdown on the back of the left calf. The wounds arm more substantial bilaterally. There is a fair amount of swelling in the left calf no overt DVT there is edema present I think in the left greater than right thigh. She is supposed to go on 9/28  for CT [name], [name]. ([id number]) venography. The wounds on the medial and lateral calf are worse and she has new skin breakdown posteriorly at least new for me. This is almost developing into a circumferential wound area The Apligraf was taken off last week which I agree with things are not going in the right direction a culture was done we do not have that back yet. She is on Augmentin that she started 2 days ago 9/23; dressing was changed by her nurses on Monday. In general there is no improvement in the wound areas although the area looks less angry than last week. She did get Augmentin for MSSA cultured on the 14th. She still appears to have too much swelling in the left leg even with 3 layer compression 9/30; the patient underwent her procedure on 9/28 by Dr. Donzetta Matters at [facility]. She was discovered to have the common iliac vein measuring 12.2 mm but at the level of L4-L5 measured 3 mm. After stenting it measured 10 mm. 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 Rouleaux flow in the common femoral and femoral vein was observed much improved after stenting. We are using silver alginate to the wounds on the medial and lateral ankle on the left. 4 layer compression 10/7; the patient had fluid  swelling around her knee and 4 layer compression. At the advice of vein and vascular this was reduced to 3 layer which she is tolerating better. We have been using silver alginate under 3 layer compression since last Friday 10/14; arrives with the areas on the left ankle looking a lot better. Inflammation in the area also a lot better. She came in for a nurse check on 10/9 10/21; continued nice improvement. Slight improvements in surface area of both the medial and lateral wounds on the left. A lot of the satellite lesions in the weeping erythema around these from stasis dermatitis is resolved. We have been using silver alginate 10/28; general improvement in the entire  wound areas although not a lot of change in dimensions the wound certainly looks better. There is a lot less in terms of venous inflammation. Continue silver alginate this week however look towards Hydrofera Blue next week 11/4; very adherent debris on the medial wound left wound is not as bad. We have been using silver alginate. Change to [facility] today 11/11; very adherent debris on both wound areas. She went to vein and vascular last week and follow-up they put in [place] boot on this today. He says the [facility] was adherent. Wound is definitely not as good as last week. Especially on the left there the satellite lesions look more prominent 11/18; absolutely no better. erythema on lateral aspect with tenderness. 09/30/2019 on evaluation today patient appears to actually be doing better. Dr. Dellia Nims did put her on doxycycline last week which I do believe has helped her at this point. Fortunately there is no signs of active infection at this time. No fevers, chills, nausea, vomiting, or diarrhea. I do believe he may want extend the doxycycline for 7 additional days just to ensure everything does completely cleared up the patient is in agreement with that plan. Otherwise she is going require some sharp debridement today 12/2; patient is completing a 2-week course of doxycycline. I gave her this empirically for inflammation as well as infection when I last saw her 2 weeks ago. All of this seems to be better. She is using silver alginate she has the area on the medial aspect of the larger area laterally and the 2 small satellite regions laterally above the major wound. 12/9; the patient's wound on the left medial and left lateral calf look really quite good. We have been using silver alginate. She saw vein and vascular in follow-up on 10/09/2019. She has had a previous left greater saphenous vein ablation by Dr. Oscar La in 2016. More recently she underwent a left common iliac vein stent by Dr.  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 The swelling is improved and certainly the wounds have improved. The patient shows [place] today area on the right medial calf there is almost no wound but leaking lymphedema. She says she start this started 3 or 4 days ago. She did not traumatize it. It is not painful. She does not wear compression on that side 12/16; the patient continues to do well laterally. Medially still requiring debridement. The area on the right calf did not materialize to anything and is not currently open. We wrapped this last time. She has support stockings for that leg although I am not sure they are going to provide adequate compression 12/23; the lateral wound looks stable. Medially still requiring debridement for tightly adherent fibrinous debris. We've been using silver alginate. Surface area not any different 12/30; neither wound is any  better with regards to surface and the area on the left lateral is larger. I been using silver alginate to the left lateral which look quite good last week and Sorbact to the left medial 11/11/2019. Lateral wound area actually looks better and somewhat smaller. Medial still requires a very aggressive debridement today. We have been using Sorbact on both wound areas 1/13; not much better still adherent debris bilaterally. I been using Sorbact. She has severe venous hypertension. Probably some degree of dermal fibrosis distally. I wonder whether tighter compression might help and I am going to try that today. We also need to work on the bioburden 1/20; using Sorbact. She has severe venous hypertension status post stent placement for pelvic vein compression. We applied gentamicin last time to see if we could reduce bioburden I had some discussion with her today about the use of pentoxifylline. This is occasionally used in this setting for wounds with refractory venous insufficiency. However this interacts with Plavix. She tells me that she  was put on this after stent placement for 3 months. She will call Dr. Claretha Cooper office to discuss 1/27; we are using gentamicin under Sorbact. 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 She has a stent. Wound medially is measuring smaller this week. Laterally measuring slightly larger although she has some satellite lesions superiorly 2/3; gentamicin under Sorbact under 4-layer compression. 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 She has a stent on Plavix. Her wounds are measuring smaller this week. More substantially laterally where there is a satellite lesion superiorly. 2/10; gentamicin under Sorbac. 4-layer compression. Patient communicated with Dr. Donzetta Matters at vein and vascular in [place]. He is okay with the patient coming off Plavix I will therefore start her on pentoxifylline for a 1 month trial. In general her wounds look better today. I had some concerns about swelling in the left thigh however she measures 61.5 on the right and 63 on the mid thigh which does not suggest there is any difficulty. The patient is not describing any pain. 2/17; gentamicin under Sorbac 4-layer compression. She has been on pentoxifylline for 1 week and complains of loose stool. No nausea she is eating and drinking well 2/24; the patient apparently came in 2 days ago for a nurse visit when her wrap fell down. Both areas look a little worse this week macerated medially and satellite lesions laterally. Change to silver alginate today 3/3; wounds are larger today especially medially. She also has more swelling in her foot lower leg and I even noted some swelling in her posterior thigh which is tender. I wonder about the patency of her stent. Fortuitously she sees Dr. Claretha Cooper group on Friday 3/10; Mrs. Harten was seen by vein and vascular on 3/5. The patient underwent ultrasound. There was no evidence of thrombosis involving the IVC no evidence of thrombosis involving  the right common iliac vein there is no evidence of thrombosis involving the right external iliac vein the left external Stanek, Neyla J. ([id number]) vein is also patent. The right common iliac vein stent appears patent bilateral common femoral veins are compressible and appear patent. I was concerned about the left common iliac stent however it looks like this is functional. She has some edema in the posterior thigh that was tender she still has that this week. I also note they had trouble finding the pulses in her left foot and booked her for an ABI baseline in 4 weeks. She will follow up in 6 months for  repeat IVC duplex. The patient stopped the pentoxifylline because of diarrhea. It does not look like that was being effective in any case. I have advised her to go back on her aspirin 81 mg tablet, vascular it also suggested this 3/17; comes in today with her wound surfaces a lot better. The excoriations from last week considerably better probably secondary to the TCA. We have been using silver alginate 3/24; comes in today with smaller wounds both medially and laterally. Both required debridement. There are 2 small satellite areas superiorly laterally. She also has a very odd bandlike area in the mid calf almost looking like there was a weakness in the wrap in a localized area. I would write this off as being this however anteriorly she has a small raised ballotable area that is very tender almost reminiscent of an abscess but there was no obvious purulent surface to it. Electronic Signature(s) Signed: 01/28/2020 4:13:18 AM By: Linton Ham MD Entered By: Linton Ham on 01/27/2020 13:30:58 Buffa, [name] -------------------------------------------------------------------------------- Physical Exam Details Patient Name: [name], [name]. Date of Service: 01/27/2020 12:45 PM Medical Record Number: [id number] Patient Account Number: [id number] Date of Birth/Sex: Mar 13, 1946 (74  y.o. F) Treating RN: Cornell Barman Primary Care Provider: [name] Other Clinician: Referring Provider: [name] Treating Provider/Extender: Tito Dine in Treatment: 59 Notes Wound exam; both sides medial and laterally are better. Still debris on the surface that I removed with an open curette. Laterally she has 2 small satellite areas also debrided. oIn the mid part of the calf there is a localized band of swelling circumferentially. This is about 2 inches wide. Nontender. However anteriorly there is a ballotable very tender area reminiscent of a deep subcutaneous abscess. I am not totally sure how this would have formed Electronic Signature(s) Signed: 01/28/2020 4:13:18 AM By: Linton Ham MD Entered By: Linton Ham on 01/27/2020 13:32:06 Steffenhagen, [name] -------------------------------------------------------------------------------- Physician Orders Details Patient Name: [name], [name]. Date of Service: 01/27/2020 12:45 PM Medical Record Number: [id number] Patient Account Number: [id number] Date of Birth/Sex: 04-May-1946 (74 y.o. F) Treating RN: Cornell Barman Primary Care Provider: [name] Other Clinician: Referring Provider: [name] Treating Provider/Extender: Tito Dine in Treatment: 23 Verbal / Phone Orders: No Diagnosis Coding Wound Cleansing Wound #10 Left,Proximal,Lateral Lower Leg o anasept - in clinic Wound #5 Left,Medial Lower Leg o anasept - in clinic Wound #6 Left,Lateral Lower Leg o anasept - in clinic Wound #9 Left,Lateral,Posterior Lower Leg o anasept - in clinic Anesthetic (add to Medication List) Wound #10 Left,Proximal,Lateral Lower Leg o Topical Lidocaine 4% cream applied to wound bed prior to debridement (In Clinic Only). Wound #5 Left,Medial Lower Leg o Topical Lidocaine 4% cream applied to wound bed prior to debridement (In Clinic Only). Wound #6 Left,Lateral Lower  Leg o Topical Lidocaine 4% cream applied to wound bed prior to debridement (In Clinic Only). Wound #9 Left,Lateral,Posterior Lower Leg o Topical Lidocaine 4% cream applied to wound bed prior to debridement (In Clinic Only). Skin Barriers/Peri-Wound Care Wound #10 Left,Proximal,Lateral Lower Leg o Triamcinolone Acetonide Ointment (TCA) - peri-wound Wound #5 Left,Medial Lower Leg o Triamcinolone Acetonide Ointment (TCA) - peri-wound Wound #6 Left,Lateral Lower Leg o Triamcinolone Acetonide Ointment (TCA) - peri-wound Wound #9 Left,Lateral,Posterior Lower Leg o Triamcinolone Acetonide Ointment (TCA) - peri-wound Primary Wound Dressing Wound #10 Left,Proximal,Lateral Lower Leg o Alginate Wound #5 Left,Medial Lower Leg o Alginate Wound #6 Left,Lateral Lower Leg o Alginate Wound #9 Left,Lateral,Posterior Lower Leg o Alginate Secondary  Dressing Wound #10 Left,Proximal,Lateral Lower Leg [name], HILL[place]. ([id number]) o Drawtex Wound #5 Left,Medial Lower Leg o Drawtex Wound #6 Left,Lateral Lower Leg o Drawtex Wound #9 Left,Lateral,Posterior Lower Leg o Drawtex Dressing Change Frequency Wound #10 Left,Proximal,Lateral Lower Leg o Change dressing every week o Other: - Nurse visit Friday and Monday if needed. Wound #5 Left,Medial Lower Leg o Change dressing every week o Other: - Nurse visit Friday and Monday if needed. Wound #6 Left,Lateral Lower Leg o Change dressing every week o Other: - Nurse visit Friday and Monday if needed. Wound #9 Left,Lateral,Posterior Lower Leg o Change dressing every week o Other: - Nurse visit Friday and Monday if needed. Follow-up Appointments Wound #10 Left,Proximal,Lateral Lower Leg o Return Appointment in 1 week. o Nurse Visit as needed Wound #5 Left,Medial Lower Leg o Return Appointment in 1 week. o Nurse Visit as needed Wound #6 Left,Lateral Lower Leg o Return Appointment in 1 week. o  Nurse Visit as needed Wound #9 Left,Lateral,Posterior Lower Leg o Return Appointment in 1 week. o Nurse Visit as needed Edema Control Wound #10 Left,Proximal,Lateral Lower Leg o 4-Layer Compression System - Left Lower Extremity. o Elevate legs to the level of the heart and pump ankles as often as possible Wound #5 Left,Medial Lower Leg o 4-Layer Compression System - Left Lower Extremity. o Elevate legs to the level of the heart and pump ankles as often as possible Wound #6 Left,Lateral Lower Leg o 4-Layer Compression System - Left Lower Extremity. o Elevate legs to the level of the heart and pump ankles as often as possible Wound #9 Left,Lateral,Posterior Lower Leg o 4-Layer Compression System - Left Lower Extremity. o Elevate legs to the level of the heart and pump ankles as often as possible Patient Medications Allergies: Voltaren, Sulfa (Sulfonamide Antibiotics), latex, Neoprene Notifications Medication Indication Start End SOLEY, LIEFER ([id number]) Notifications Medication Indication Start End doxycycline monohydrate celluitis left leg 01/27/2020 DOSE oral 100 mg capsule - 1 capsule oral bid for 7 days Electronic Signature(s) Signed: 01/27/2020 1:36:14 PM By: Linton Ham MD Entered By: Linton Ham on 01/27/2020 13:36:13 Sailors, [name] -------------------------------------------------------------------------------- Problem List Details Patient Name: KEON, WALTERMIRE. Date of Service: 01/27/2020 12:45 PM Medical Record Number: [id number] Patient Account Number: [id number] Date of Birth/Sex: 1946-10-01 (74 y.o. 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 Date of Service: 01/27/2020 12:45 PM Medical Record Number: [id number] Patient Account Number: [id number] Date of Birth/Sex: [birth year] (74 y.o. F) Treating RN: Cornell Barman Primary Care Provider: [name] Other Clinician: Referring Provider: [name] Treating Provider/Extender: Tito Dine in Treatment: 34 Subjective History of Present Illness (HPI) Pleasant 74 year old with history of  chronic venous insufficiency. No diabetes or peripheral vascular disease. Left ABI 1.29. Questionable history of left lower extremity DVT. She developed a recurrent ulceration on her left lateral calf in December 2015, which she attributes to poor diet and subsequent lower extremity edema. She underwent endovenous laser ablation of her left greater saphenous vein in 2010. She underwent laser ablation of accessory branch of left GSV in April 2016 by Dr. Kellie Simmering at [facility]. She was previously wearing [place] boots, which she tolerated well. Tolerating 2 layer compression and cadexomer iodine. She returns to clinic for follow-up and is without new complaints. She denies  any significant pain at this time. She reports persistent pain with pressure. No claudication or ischemic rest pain. No fever or chills. No drainage. READMISSION 11/13/16; this is a 74 year old woman who is not a diabetic. She is here for a review of a painful area on her left medial lower extremity. I note that she was seen here previously last year for wound I believe to be in the same area. At that time she had undergone previously a left greater saphenous vein ablation by Dr. Kellie Simmering and she had a ablation of the anterior accessory branch of the left greater saphenous vein in March 2016. Seeing that the wound actually closed over. In reviewing the history with her today the ulcer in this area has been recurrent. She describes a biopsy of this area in 2009 that only showed stasis physiology. 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 Do not see that she has ever had arterial studies in the left leg. She tells me she has a follow-up with Dr. Kellie Simmering in roughly 10 days In any case she developed the reopening of this area roughly a month ago. On the background of this she describes rapidly increasing edema which has responded to Lasix 40 mg and metolazone 2.5 mg as well as the patient's lymph massage. She has been told she has both venous insufficiency and lymphedema but she cannot tolerate compression stockings 11/28/16; the patient saw Dr. Kellie Simmering recently. Per the patient he did arterial Dopplers in the office that did not show evidence of arterial insufficiency, per the patient he stated "treat this like an ordinary venous ulcer". She also saw her dermatologist Dr. Ronnald Ramp who felt that this was more of a vascular ulcer. In general things are improving although she arrives today with  increasing bilateral lower extremity edema with weeping a deeper fluid through the wound on the left medial leg compatible with some degree of lymphedema 12/04/16; the patient's wound is fully epithelialized but I don't think fully healed. We will do another week of depression with Promogran and TCA however I suspect we'll be able to discharge her next week. This is a very unusual-looking wound which was initially a figure-of-eight type wound lying on its side surrounded by petechial like hemorrhage. She has had venous ablation on this side. She apparently does not have an arterial issue per Dr. Kellie Simmering. She saw her dermatologist thought it was "vascular". Patient is definitely going to need ongoing compression and I talked about this with her today she will go to elastic therapy after she leaves here next week 12/11/16; the patient's wound is not completely closed today. She has surrounding scar tissue and in  further discussion with the patient it would appear that she had ulcers in this area in 2009 for a prolonged period of time ultimately requiring a punch biopsy of this area that only showed venous insufficiency. I did not previously pickup on this part of the history from the patient. 12/18/16; the patient's wound is completely epithelialized. There is no open area here. She has significant bilateral venous insufficiency with secondary lymphedema to a mild-to-moderate degree she does not have compression stockings.. She did not say anything to me when I was in the room, she told our intake nurse that she was still having pain in this area. This isn't unusual recurrent small open area. She is going to go to elastic therapy to obtain compression stockings. 12/25/16; the patient's wound is fully epithelialized. There is no open area here. The patient describes some continued episodic discomfort in this area medial left calf. However everything looks fine and healed here. She is been to elastic therapy  and caught herself 15-20 mmHg stockings, they apparently were having trouble getting 20-30 mm stockings in her size 01/22/17; this is a patient we discharged from the clinic a month ago. She has a recurrent open wound on her medial left calf. She had 15 mm support stockings. I told her I thought she needed 20-30 mm compression stockings. She tells me that she has been ill with hospitalization secondary to asthma and is been found to have severe hypokalemia likely secondary to a combination of Lasix and metolazone. This morning she noted blistering and leaking fluid on the posterior part of her left leg. She called our intake nurse urgently and we was saw her this afternoon. She has not had any real discomfort here. I don't know that she's been wearing any stockings on this leg for at least 2-3 days. ABIs in this clinic were 1.21 on the right and 1.3 on the left. She is previously seen vascular surgery who does not think that there is a peripheral arterial issue. 01/30/17; Patient arrives with no open wound on the left leg. She has been to elastic therapy and obtained 20-35mmhg below knee stockings and she has one on the right leg today. READMISSION 02/19/18; this Macgregor is a now 74 year old patient we've had in this clinic perhaps 3 times before. I had last looked at her from January 07 December 2016 with an area on the medial left leg. We discharged her on 12/25/16 however she had to be readmitted on 01/22/17 with a recurrence. I have in my notes that we discharged her on 20-30 mm stockings although she tells me she was only wearing support hose because she cannot get stockings on predominantly related to her cervical spine surgery/issues. She has had previous ablations done by vein and vascular in [place] including a great saphenous vein ablation on the left with an anterior accessory branch ablation I think both of these were in 2016. On one of the previous visit she had a biopsy noted 2009 that was  negative. She is not felt to have an arterial issue. She is not a diabetic. She does have a history of obstructive sleep apnea hypertension asthma as well as chronic venous insufficiency and lymphedema. TALA, SAWYERS ([id number]) On this occasion she noted 2 dry scaly patch on her left leg. She tried to put lotion on this it didn't really help. There were 2 open areas.the patient has been seeing her primary physician from 02/05/18 through 02/14/18. She had Unna boots applied. The superior wound now  on the lateral left leg has closed but she's had one wound that remains open on the lateral left leg. This is not the same spot as we dealt with in 2018. ABIs in this clinic were 1.3 bilaterally 02/26/18; patient has a small wound on the left lateral calf. Dimensions are down. She has chronic venous insufficiency and lymphedema. 03/05/18; small open area on the left lateral calf. Dimensions are down. Tightly adherent necrotic debris over the surface of the wound which was difficult to remove. Also the dressing [over collagen] stuck to the wound surface. This was removed with some difficulty as well. Change the primary dressing to Hydrofera Blue ready 03/12/18; small open area on the left lateral calf. Comes in with tightly adherent surface eschar as well as some adherent Hydrofera Blue. 03/19/18; open area on the left lateral calf. Again adherent surface eschar as well as some adherent Hydrofera Blue nonviable subcutaneous tissue. She complained of pain all week even with the reduction from 4-3 layer compression I put on last week. Also she had an increase in her ankle and calf measurements probably related to the same thing. 03/26/18; open area on the left lateral calf. A very small open area remains here. We used silver alginate starting last week as the Hydrofera Blue seem to stick to the wound bed. In using 4-layer compression 04/02/18; the open area in the left lateral calf at some adherent slough which I  removed there is no open area here. We are able to transition her into her own compression stocking. Truthfully I think this is probably his support hose. However this does not maintain skin integrity will be limited. She cannot put over the toe compression stockings on because of neck problems hand problems etc. She is allergic to the lining layer of juxta lites. We might be forced to use extremitease stocking should this fail READMIT 11/24/2018 Patient is now a 74 year old woman who is not a diabetic. She has been in this clinic on at least 3 previous occasions largely with recurrent wounds on her left leg secondary to chronic venous insufficiency with secondary lymphedema. Her situation is complicated by inability to get stockings on and an allergy to neoprene which is apparently a component and at least juxta lites and other stockings. As a result she really has not been wearing any stockings on her legs. She tells [place] that roughly 2 or 3 weeks ago she started noticing a stinging sensation just above her ankle on the left medial aspect. She has been diagnosed with pseudogout and she wondered whether this was what she was experiencing. She tried to dress this with something she bought at the store however subsequently it pulled skin off and now she has an open wound that is not improving. She has been using Vaseline gauze with a cover bandage. She saw her primary doctor last week who put an Haematologist on her. ABIs in this clinic was 1.03 on the left 2/12; the area is on the left medial ankle. Odd-looking wound with what looks to be surface epithelialization but a multitude of small petechial openings. This clearly not closed yet. We have been using silver alginate under 3 layer compression with TCA 2/19; the wound area did not look quite as good this week. Necrotic debris over the majority of the wound surface which required debridement. She continues to have a multitude of what looked to be small  petechial openings. She reminds [place] that she had a biopsy on this initially during  her first outbreak in 2015 in [place]. She expresses concern about this being a possible melanoma. 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 I assured her that this does not look anything like melanoma. Except for the petechial reaction it does look like a venous insufficiency area and she certainly has evidence of this on both sides 2/26; a difficult area on the left medial ankle. The patient clearly has chronic venous hypertension with some degree of lymphedema. The odd thing about the area is the small petechial hemorrhages. I am not really sure how to explain this. This was present last time and this is not a compression injury. We have been using Hydrofera Blue which I changed to last week 3/4; still using Hydrofera Blue. Aggressive debridement today. She does not have known arterial issues. She has seen Dr. Kellie Simmering at [facility] vein and vascular and and has an ablation on the left. [Anterior accessory branch of the greater saphenous]. From what I remember they did not feel she had an arterial issue. The patient has had this area biopsied in 2009 at [facility] dermatology and by her recollection they said this was "stasis". She is also follow-up with dermatology locally who thought that this was more of a vascular issue 3/11; using Hydrofera Blue. Aggressive debridement today. She does not have an arterial issue. We are using 3 layer compression although we may need to go to 4. The patient has been in for multiple changes to her wrap since I last saw her a week ago. She says that the area was leaking. I do not have too much more information on what was found 01/19/19 on evaluation today patient was actually being seen for a nurse visit when unfortunately she had the area on her left lateral lower extremity as well  as weeping from the right lower extremity that became apparent. Therefore we did end up actually seeing her for a full visit with myself. She is having some pain at this site as well but fortunately nothing too significant at this point. No fevers, chills, nausea, or vomiting noted at this time. 3/18-Patient is back to the clinic with the left leg venous leg ulcer, the ulcer is larger in size, has a surface that is densely adherent with fibrinous tissue, the Hydrofera Blue was used but is densely adherent and there was difficulty in removing it. The right lower extremity was also wrapped for weeping edema. Patient has a new area over the left lateral foot above the malleolus that is small and appears to have no debris with intact surrounding skin. Patient is on increased dose of Lasix also as a means to edema management 3/25; the patient has a nonhealing venous ulcer on the medial left leg and last week developed a smaller area on the lateral left calf. We have been using Hydrofera Blue with a contact layer. 4/1; no major change in these wounds areas. Left medial and more recently left lateral calf. I tried Iodoflex last week to aid in debridement she did not tolerate this. She stated her pain was terrible all week. She took the top layer of the 4 layer compression off. 4/8; the patient actually looks somewhat better in terms of her more prominent left lateral calf wound. There is some healthy looking tissue here. She is still complaining of a lot of discomfort. 4/15; patient in a lot of pain secondary to sciatica. She is on a  prednisone taper prescribed by her primary physician. She has the 2 areas one on the left medial and more recently a smaller area on the left lateral calf. Both of these just above the malleoli 4/22; her back pain is better but she still states she is very uncomfortable and now feels she is intolerant to the [employer]. No real change in the wounds we have been using Sorbact. She  has been previously intolerant to Iodoflex. There is not a lot of option about what we can use to debride this wound under compression that she no doubt needs. sHe states Ultram no longer works for her pain ALLIZZON, COYLE ([id number]) 4/29; no major change in the wounds slightly increased depth. Surface on the original medial wound perhaps somewhat improved however the more recent area on the lateral left ankle is 100% covered in very adherent debris we have been using Sorbact. She tolerates 4 layer compression well and her edema control is a lot better. She has not had to come in for a nurse check 5/6; no major change in the condition of the wounds. She did consent to debridement today which was done with some difficulty. Continuing Sorbact. She did not tolerate Iodoflex. She was in for a check of her compression the day after we wrapped her last week this was adjusted but nothing much was found 5/13; no major change in the condition or area of the wounds. I was able to get a fairly aggressive debridement done on the lateral left leg wound. Even using Sorbact under compression. She came back in on Friday to have the wrap changed. She says she felt uncomfortable on the lateral aspect of her ankle. She has a long history of chronic venous insufficiency including previous ablation surgery on this side. 5/20-Patient returns for wounds on left leg with both wounds covered in slough, with the lateral leg wound larger in size, she has been in 3 layer compression and felt more comfortable, she describes pain in ankle, in leg and pins and needles in foot, and is about to try Pamelor for this 6/3; wounds on the left lateral and left medial leg. The area medially which is the most recent of the 2 seems to have had the largest increase in dimensions. We have been using Sorbac to try and debride the surface. She has been to see orthopedics they apparently did a plain x-ray that was indeterminant. Diagnosed her  with neuropathy and they have ordered an MRI to determine if there is underlying osteomyelitis. This was not high on my thought list but I suppose it is prudent. We have advised her to make an appointment with vein and vascular in [place]. She has a history of a left greater saphenous and accessory vein ablations I wonder if there is anything else that can be done from a surgical point of view to help in these difficult refractory wounds. We have previously healed this wound on one occasion but it keeps on reopening [medial side] 6/10; deep tissue culture I did last week I think on the left medial wound showed both moderate E. coli and moderate staph aureus [MSSA]. She is going to require antibiotics and I have chosen Augmentin. We have been using Sorbact and we have made better looking wound surface on both sides but certainly no improvement in wound area. She was back in last Friday apparently for a dressing changes the wrap was hurting her outer left ankle. She has not managed to get a hold  of vein and vascular in [place]. We are going to have to make her that appointment 6/17; patient is tolerating the Augmentin. She had an MRI that I think was ordered by orthopedic surgeon this did not show osteomyelitis or an abscess did suggest cellulitis. We have been using Sorbact to the lateral and medial ankles. We have been trying to arrange a follow-up appointment with vein and vascular in [place] or did her original ablations. We apparently an area sent the request to vein and vascular in [facility] 6/24; patient has completed the Augmentin. We do not yet have a vein and vascular appointment in [place]. I am not sure what the issue is here we have asked her to call tomorrow. We are using Sorbact. Making some improvements and especially the medial wound. Both surfaces however look better medial and lateral. 7/1; the patient has been in contact with vein and vascular in [place] but has not  yet received an appointment. Using Sorbact we have gradually improve the wound surface with no improvement in surface area. She is approved for Apligraf but the wound surface still is not completely viable. She has not had to come in for a dressing change 7/8; the patient has an appointment with vein and vascular on 7/31 which is a Friday afternoon. She is concerned about getting back here for [place] to dress her wounds. I think it is important to have them goal for her venous reflux/history of ablations etc. to see if anything else can be done. She apparently tested positive for 1 of the blood tests with regards to lupus and saw a rheumatologist. He has raised the issue of vasculitis again. I have had this thought in the past however the evidence seems overwhelming that this is a venous reflux etiology. If the rheumatologist tells me there is clinical and laboratory investigation is positive for lupus I will rethink this. 7/15; the patient's wound surfaces are quite a bit better. The medial area which was her original wound now has no depth although the lateral wound which was the more recent area actually appears larger. Both with viable surfaces which is indeed better. Using Sorbact. I wanted to use Apligraf on her however there is the issue of the vein and vascular appointment on 7/31 at 2:00 in the afternoon which would not allow her to get back to be rewrapped and they would no doubt remove the graft 7/22; the patient's wound surfaces have moderate amount of debris although generally look better. The lateral one is larger with 2 small satellite areas superiorly. We are waiting for her vein and vascular appointment on 7/31. She has been approved for Apligraf which I would like to use after th 7/29; wound surfaces have improved no debridement is required we have been using Sorbact. She sees vein and vascular on Friday with this so question of whether anything can be done to lessen the likelihood of  recurrence and/or speed the healing of these areas. She is already had previous ablations. She no doubt has severe venous hypertension 8/5-Patient returns at 1 week, she was in [place] for 3 days by her podiatrist, we have been using so backed to the wound, she has increased pain in both the wounds on the left lower leg especially the more distal one on the lateral aspect 8/12-Patient returns at 1 week and she is agreeable to having debridement in both wounds on her left leg today. We have been using Sorbact, and vascular studies were reviewed at last visit  8/19; the patient arrives with her wounds fairly clean and no debridement is required. We have used Sorbact which is really done a nice job in cleaning up these very difficult wound surfaces. The patient saw Dr. Donzetta Matters of vascular surgery on 7/31. He did not feel that there was an arterial component. He felt that her treated greater saphenous vein is adequately addressed and that the small saphenous vein did not appear to be involved significantly. She was also noted to have deep venous reflux which is not treatable. Dr. Donzetta Matters mentioned the possibility of a central obstructive component leading to reflux and he offered her central venography. She wanted to discuss this or think about it. I have urged her to go ahead with this. She has had recurrent difficult wounds in these areas which do heal but after months in the clinic. If there is anything that can be done to reduce the likelihood of this I think it is worth it. 9/2 she is still working towards getting follow-up with Dr. Donzetta Matters to schedule her CT. Things are quite a bit worse venography. I put Apligraf on 2 weeks ago on both wounds on the medial and lateral part of her left lower leg. She arrives in clinic today with 3 superficial additional wounds above the area laterally and one below the wound medially. She describes a lot of discomfort. I think these are probably wrapped injuries. Does not  look like she has cellulitis. 07/20/2019 on evaluation today patient appears to be doing somewhat poorly in regard to her lower extremity ulcers. She in fact showed signs of erythema in fact we may even be dealing with an infection at this time. Unfortunately I am unsure if this is just infection or if indeed there may be some allergic reaction that occurred as a result of the Apligraf application. With that being said that would be unusual but nonetheless not impossible in this patient is one who is unfortunately allergic to quite a bit. Currently we have been using the Sorbact which seems to do as well as anything for her. I do think we may want to obtain a culture today to see if there is anything showing up there that may need to be addressed. 9/16; noted that last week the wounds look worse in 1 week follow-up of the Apligraf. Using Sorbact as of 2 days ago. She arrives with copious amounts of drainage and new skin breakdown on the back of the left calf. The wounds arm more substantial bilaterally. There is a fair amount of swelling in the left calf no overt DVT there is edema present I think in the left greater than right thigh. She is supposed to go on 9/28 for CT [name], [name]. ([id number]) venography. The wounds on the medial and lateral calf are worse and she has new skin breakdown posteriorly at least new for me. This is almost developing into a circumferential wound area The Apligraf was taken off last week which I agree with things are not going in the right direction a culture was done we do not have that back yet. She is on Augmentin that she started 2 days ago 9/23; dressing was changed by her nurses on Monday. In general there is no improvement in the wound areas although the area looks less angry than last week. She did get Augmentin for MSSA cultured on the 14th. She still appears to have too much swelling in the left leg even with 3 layer compression 9/30; the patient underwent  her procedure on 9/28 by Dr. Donzetta Matters at vascular and vein specialist. She was discovered to have the common iliac vein measuring 12.2 mm but at the level of L4-L5 measured 3 mm. After stenting it measured 10 mm. 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 Rouleaux flow in the common femoral and femoral vein was observed much improved after stenting. We are using silver alginate to the wounds on the medial and lateral ankle on the left. 4 layer compression 10/7; the patient had fluid swelling around her knee and 4 layer compression. At the advice of vein and vascular this was reduced to 3 layer which she is tolerating better. We have been using silver alginate under 3 layer compression since last Friday 10/14; arrives with the areas on the left ankle looking a lot better. Inflammation in the area also a lot better. She came in for a nurse check on 10/9 10/21; continued nice improvement. Slight improvements in surface area of both the medial and lateral wounds on the left. A lot of the satellite lesions in the weeping erythema around these from stasis dermatitis is resolved. We have been using silver alginate 10/28; general improvement in the entire wound areas although not a lot of change in dimensions the wound certainly looks better. There is a lot less in terms of venous inflammation. Continue silver alginate this week however look towards Hydrofera Blue next week 11/4; very adherent debris on the medial wound left wound is not as bad. We have been using silver alginate. Change to [facility] today 11/11; very adherent debris on both wound areas. She went to vein and vascular last week and follow-up they put in [place] boot on this today. He says the [facility] was adherent. Wound is definitely not as good as last week. Especially on the left there the satellite lesions look more prominent 11/18; absolutely no better. erythema on lateral aspect with tenderness. 09/30/2019 on  evaluation today patient appears to actually be doing better. Dr. Dellia Nims did put her on doxycycline last week which I do believe has helped her at this point. Fortunately there is no signs of active infection at this time. No fevers, chills, nausea, vomiting, or diarrhea. I do believe he may want extend the doxycycline for 7 additional days just to ensure everything does completely cleared up the patient is in agreement with that plan. Otherwise she is going require some sharp debridement today 12/2; patient is completing a 2-week course of doxycycline. I gave her this empirically for inflammation as well as infection when I last saw her 2 weeks ago. All of this seems to be better. She is using silver alginate she has the area on the medial aspect of the larger area laterally and the 2 small satellite regions laterally above the major wound. 12/9; the patient's wound on the left medial and left lateral calf look really quite good. We have been using silver alginate. She saw vein and vascular in follow-up on 10/09/2019. She has had a previous left greater saphenous vein ablation by Dr. Oscar La in 2016. More recently she underwent a left common iliac vein stent by Dr. 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 The swelling is improved and certainly the wounds have improved. The patient shows [place] today area on the right medial calf there is almost no wound but leaking lymphedema. She says she start this started 3 or 4 days ago. She did not traumatize it. It is not painful. She does  not wear compression on that side 12/16; the patient continues to do well laterally. Medially still requiring debridement. The area on the right calf did not materialize to anything and is not currently open. We wrapped this last time. She has support stockings for that leg although I am not sure they are going to provide adequate compression 12/23; the lateral wound looks stable. Medially still requiring debridement for  tightly adherent fibrinous debris. We've been using silver alginate. Surface area not any different 12/30; neither wound is any better with regards to surface and the area on the left lateral is larger. I been using silver alginate to the left lateral which look quite good last week and Sorbact to the left medial 11/11/2019. Lateral wound area actually looks better and somewhat smaller. Medial still requires a very aggressive debridement today. We have been using Sorbact on both wound areas 1/13; not much better still adherent debris bilaterally. I been using Sorbact. She has severe venous hypertension. Probably some degree of dermal fibrosis distally. I wonder whether tighter compression might help and I am going to try that today. We also need to work on the bioburden 1/20; using Sorbact. She has severe venous hypertension status post stent placement for pelvic vein compression. We applied gentamicin last time to see if we could reduce bioburden I had some discussion with her today about the use of pentoxifylline. This is occasionally used in this setting for wounds with refractory venous insufficiency. However this interacts with Plavix. She tells me that she was put on this after stent placement for 3 months. She will call Dr. Claretha Cooper office to discuss 1/27; we are using gentamicin under Sorbact. 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 She has a stent. Wound medially is measuring smaller this week. Laterally measuring slightly larger although she has some satellite lesions superiorly 2/3; gentamicin under Sorbact under 4-layer compression. 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 She has a stent on Plavix. Her wounds are measuring smaller this week. More substantially laterally where there is a satellite lesion superiorly. 2/10; gentamicin under Sorbac. 4-layer compression. Patient communicated with Dr. Donzetta Matters at [facility] in  [place]. He is okay with the patient coming off Plavix I will therefore start her on pentoxifylline for a 1 month trial. In general her wounds look better today. I had some concerns about swelling in the left thigh however she measures 61.5 on the right and 63 on the mid thigh which does not suggest there is any difficulty. The patient is not describing any pain. 2/17; gentamicin under Sorbac 4-layer compression. She has been on pentoxifylline for 1 week and complains of loose stool. No nausea she is eating and drinking well 2/24; the patient apparently came in 2 days ago for a nurse visit when her wrap fell down. Both areas look a little worse this week macerated medially and satellite lesions laterally. Change to silver alginate today 3/3; wounds are larger today especially medially. She also has more swelling in her foot lower leg and I even noted some swelling in her posterior thigh which is tender. I wonder about the patency of her stent. Fortuitously she sees Dr. Claretha Cooper group on Friday 3/10; Mrs. Valcin was seen by [facility] on 3/5. The patient underwent ultrasound. There was no evidence of thrombosis involving the IVC no evidence of thrombosis involving the right common iliac vein there is no evidence of thrombosis involving the right external iliac vein the left external Katich,  Leahanna J. ([id number]) vein is also patent. The right common iliac vein stent appears patent bilateral common femoral veins are compressible and appear patent. I was concerned about the left common iliac stent however it looks like this is functional. She has some edema in the posterior thigh that was tender she still has that this week. I also note they had trouble finding the pulses in her left foot and booked her for an ABI baseline in 4 weeks. She will follow up in 6 months for repeat IVC duplex. The patient stopped the pentoxifylline because of diarrhea. It does not look like that was being effective  in any case. I have advised her to go back on her aspirin 81 mg tablet, vascular it also suggested this 3/17; comes in today with her wound surfaces a lot better. The excoriations from last week considerably better probably secondary to the TCA. We have been using silver alginate 3/24; comes in today with smaller wounds both medially and laterally. Both required debridement. There are 2 small satellite areas superiorly laterally. She also has a very odd bandlike area in the mid calf almost looking like there was a weakness in the wrap in a localized area. I would write this off as being this however anteriorly she has a small raised ballotable area that is very tender almost reminiscent of an abscess but there was no obvious purulent surface to it. Objective Constitutional Vitals Time Taken: 12:45 PM, Height: 63 in, Weight: 224.7 lbs, BMI: 39.8, Temperature: 98.9 F, Pulse: 73 bpm, Respiratory Rate: 18 breaths/min, Blood Pressure: 144/69 mmHg. Integumentary (Hair, Skin) Wound #10 status is Open. Original cause of wound was Blister. The wound is located on the Left,Proximal,Lateral Lower Leg. The wound measures 1.2cm length x 1.2cm width x 0.1cm depth; 1.131cm^2 area and 0.113cm^3 volume. There is Fat Layer (Subcutaneous Tissue) Exposed exposed. There is no tunneling or undermining noted. There is a medium amount of purulent drainage noted. The wound margin is flat and intact. There is medium (34-66%) pink granulation within the wound bed. There is a medium (34-66%) amount of necrotic tissue within the wound bed including Adherent Slough. Wound #5 status is Open. Original cause of wound was Gradually Appeared. The wound is located on the Left,Medial Lower Leg. The wound measures 1.4cm length x 1.2cm width x 0.2cm depth; 1.319cm^2 area and 0.264cm^3 volume. There is Fat Layer (Subcutaneous Tissue) Exposed exposed. There is no tunneling or undermining noted. There is a medium amount of sanguinous  drainage noted. The wound margin is flat and intact. There is small (1-33%) pink granulation within the wound bed. There is a large (67-100%) amount of necrotic tissue within the wound bed including Adherent Slough. Wound #6 status is Open. Original cause of wound was Gradually Appeared. The wound is located on the Left,Lateral Lower Leg. The wound measures 3.6cm length x 1.7cm width x 0.2cm depth; 4.807cm^2 area and 0.961cm^3 volume. There is Fat Layer (Subcutaneous Tissue) Exposed exposed. There is no tunneling or undermining noted. There is a medium amount of sanguinous drainage noted. The wound margin is flat and intact. There is small (1-33%) pink granulation within the wound bed. There is a large (67-100%) amount of necrotic tissue within the wound bed including Adherent Slough. Wound #9 status is Open. Original cause of wound was Gradually Appeared. The wound is located on the Left,Lateral,Posterior Lower Leg. The wound measures 0.7cm length x 0.5cm width x 0.2cm depth; 0.275cm^2 area and 0.055cm^3 volume. There is Fat Layer (Subcutaneous Tissue) Exposed  exposed. There is no tunneling or undermining noted. There is a medium amount of sanguinous drainage noted. The wound margin is flat and intact. There is small (1-33%) pink granulation within the wound bed. There is a large (67-100%) amount of necrotic tissue within the wound bed including Adherent Slough. 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 ([id number]) Procedures Wound #5 Pre-procedure diagnosis of Wound #5 is a Lymphedema located on the Left,Medial Lower Leg . There was a Excisional Skin/Subcutaneous Tissue Debridement with a total area of  1.68 sq cm performed by [name] Dillon, MD. With the following instrument(s): Curette to remove Viable and Non-Viable tissue/material. Material removed includes Subcutaneous Tissue and Slough and after achieving pain control using Lidocaine. No specimens were taken. A time out was conducted at 13:14, prior to the start of the procedure. A Minimum amount of bleeding was controlled with Pressure. The procedure was tolerated well. Post Debridement Measurements: 1.4cm length x 1.2cm width x 0.3cm depth; 0.396cm^3 volume. Character of Wound/Ulcer Post Debridement is stable. Post procedure Diagnosis Wound #5: Same as Pre-Procedure Wound #6 Pre-procedure diagnosis of Wound #6 is a Venous Leg Ulcer located on the Left,Lateral Lower Leg .Severity of Tissue Pre Debridement is: Fat layer exposed. There was a Excisional Skin/Subcutaneous Tissue Debridement with a total area of 6.12 sq cm performed by [name] Dillon, MD. With the following instrument(s): Curette to remove Viable and Non-Viable tissue/material. Material removed includes Subcutaneous Tissue and Slough and after achieving pain control using Lidocaine. No specimens were taken. A time out was conducted at 13:14, prior to the start of the procedure. A Minimum amount of bleeding was controlled with Pressure. The procedure was tolerated well. Post Debridement Measurements: 3.6cm length x 1.7cm width x 0.2cm depth; 0.961cm^3 volume. Character of Wound/Ulcer Post Debridement is stable. Severity of Tissue Post Debridement is: Fat layer exposed. Post procedure Diagnosis Wound #6: Same as Pre-Procedure Plan Wound Cleansing: Wound #10 Left,Proximal,Lateral Lower Leg: anasept - in clinic Wound #5 Left,Medial Lower Leg: anasept - in clinic Wound #6 Left,Lateral Lower Leg: anasept - in clinic Wound #9 Left,Lateral,Posterior Lower Leg: anasept - in clinic Anesthetic (add to Medication List): Wound #10 Left,Proximal,Lateral Lower Leg: Topical  Lidocaine 4% cream applied to wound bed prior to debridement (In Clinic Only). Wound #5 Left,Medial Lower Leg: Topical Lidocaine 4% cream applied to wound bed prior to debridement (In Clinic Only). Wound #6 Left,Lateral Lower Leg: Topical Lidocaine 4% cream applied to wound bed prior to debridement (In Clinic Only). Wound #9 Left,Lateral,Posterior Lower Leg: Topical Lidocaine 4% cream applied to wound bed prior to debridement (In Clinic Only). Skin Barriers/Peri-Wound Care: Wound #10 Left,Proximal,Lateral Lower Leg: Triamcinolone Acetonide Ointment (TCA) - peri-wound Wound #5 Left,Medial Lower Leg: Triamcinolone Acetonide Ointment (TCA) - peri-wound Wound #6 Left,Lateral Lower Leg: Triamcinolone Acetonide Ointment (TCA) - peri-wound Wound #9 Left,Lateral,Posterior Lower Leg: Triamcinolone Acetonide Ointment (TCA) - peri-wound Primary Wound Dressing: Wound #10 Left,Proximal,Lateral Lower Leg: Alginate Wound #5 Left,Medial Lower Leg: Alginate Wound #6 Left,Lateral Lower Leg: Alginate Wound #9 Left,Lateral,Posterior Lower Leg: ALVETA, SWADER ([id number]) Alginate Secondary Dressing: Wound #10 Left,Proximal,Lateral Lower Leg: Drawtex Wound #5 Left,Medial Lower Leg: Drawtex Wound #6 Left,Lateral Lower Leg: Drawtex Wound #9 Left,Lateral,Posterior Lower Leg: Drawtex Dressing Change Frequency: Wound #10 Left,Proximal,Lateral Lower Leg: Change dressing  every week Other: - Nurse visit Friday and Monday if needed. Wound #5 Left,Medial Lower Leg: Change dressing every week Other: - Nurse visit Friday and Monday if needed. Wound #6 Left,Lateral Lower Leg: Change dressing every week Other: - Nurse visit Friday and Monday if needed. Wound #9 Left,Lateral,Posterior Lower Leg: Change dressing every week Other: - Nurse visit Friday and Monday if needed. Follow-up Appointments: Wound #10 Left,Proximal,Lateral Lower Leg: Return Appointment in 1 week. Nurse Visit as needed Wound #5  Left,Medial Lower Leg: Return Appointment in 1 week. Nurse Visit as needed Wound #6 Left,Lateral Lower Leg: Return Appointment in 1 week. Nurse Visit as needed Wound #9 Left,Lateral,Posterior Lower Leg: Return Appointment in 1 week. Nurse Visit as needed Edema Control: Wound #10 Left,Proximal,Lateral Lower Leg: 4-Layer Compression System - Left Lower Extremity. Elevate legs to the level of the heart and pump ankles as often as possible Wound #5 Left,Medial Lower Leg: 4-Layer Compression System - Left Lower Extremity. Elevate legs to the level of the heart and pump ankles as often as possible Wound #6 Left,Lateral Lower Leg: 4-Layer Compression System - Left Lower Extremity. Elevate legs to the level of the heart and pump ankles as often as possible Wound #9 Left,Lateral,Posterior Lower Leg: 4-Layer Compression System - Left Lower Extremity. Elevate legs to the level of the heart and pump ankles as often as possible 1. I put her on empiric doxycycline for a week I was unable to really explain the findings totally in her mid leg. I thought this might be a localized weakness in the wrap but the very tender ballotable area anteriorly was difficult to explain. There was no purulence. I was reluctant to open this and the patient was reluctant to have it done. 2. Fortunately her wounds look a lot better after debridement. I think they are measuring smaller. We are using silver alginate. 3. I am going to bring her back for a nurse visit on Friday just to make sure everything in the mid leg is stable to improved Electronic Signature(s) Signed: 01/28/2020 4:13:18 AM By: Linton Ham MD Entered By: Linton Ham on 01/27/2020 13:33:33 Whisenant, [name] -------------------------------------------------------------------------------- SuperBill Details Patient Name: ITATY, STRENGTH. Date of Service: 01/27/2020 Medical Record Number: [id number] Patient Account Number:  [id number] Date of Birth/Sex: 03-30-1946 (74 y.o. 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 Facility Procedures CPT4 Code: [id number] Description: [id number] - DEB SUBQ TISSUE 20 SQ CM/< Modifier: Quantity: 1 CPT4 Code: Description: ICD-10 Diagnosis Description L97.221 Non-pressure chronic ulcer of left calf limited to breakdown of skin I87.332 Chronic venous hypertension (idiopathic) with ulcer and inflammation of left l I89.0 Lymphedema, not elsewhere classified Modifier: ower extremity Quantity: Physician Procedures CPT4 [id number] Description: 11042 - WC PHYS SUBQ TISS 20 SQ CM Modifier: Quantity: 1 CPT4 Code: Description: ICD-10 Diagnosis Description L97.221 Non-pressure chronic ulcer of left calf limited to breakdown of skin I87.332 Chronic venous hypertension (idiopathic) with ulcer and inflammation of left l I89.0 Lymphedema, not elsewhere classified Modifier: ower extremity Quantity: Electronic Signature(s) Signed: 01/28/2020 4:13:18 AM By: Linton Ham MD Entered By: Linton Ham on 01/27/2020 13:34:00

## 2020-02-17 NOTE — Progress Notes (Signed)
[name] Visit Report for 01/27/2020 Arrival Information Details Patient Name: [name], [name]. Date of Service: 01/27/2020 12:45 PM Medical Record Number: [id number] Patient Account Number: [id number] Date of Birth/Sex: [birth year] (74 y.o. F) Treating RN: Cornell Barman Primary Care Angela Platner: [name] Other Clinician: Referring Tierre Gerard: [name] Treating Lillian Ballester/Extender: Tito Dine in Treatment: 6 Visit Information History Since Last Visit Added or deleted any medications: No Patient Arrived: Ambulatory Any new allergies or adverse reactions: No Arrival Time: 12:44 Had a fall or experienced change in No Accompanied By: self activities of daily living that may affect Transfer Assistance: None risk of falls: Patient Identification Verified: Yes Signs or symptoms of abuse/neglect since last visito No Secondary Verification Process Completed: Yes Hospitalized since last visit: No Patient Requires Transmission-Based No Implantable device outside of the clinic excluding No Precautions: cellular tissue based products placed in the center Patient Has Alerts: Yes since last visit: Patient Alerts: Patient on Blood Has Dressing in Place as Prescribed: Yes Thinner Has Compression in Place as Prescribed: Yes aspirin 81 Pain Present Now: No Electronic Signature(s) Signed: 01/27/2020 4:05:17 PM By: Lorine Bears RCP, RRT, CHT Entered By: Lorine Bears on 01/27/2020 12:44:58 Katona, [name] -------------------------------------------------------------------------------- Encounter Discharge Information Details Patient Name: [name]. Date of Service: 01/27/2020 12:45 PM Medical Record Number: [id number] Patient Account Number: [id number] Date of Birth/Sex: [birth year] (74 y.o. F) Treating RN: Cornell Barman Primary Care Foye Damron: [name] Other Clinician: Referring Dossie Ocanas: [name] Treating Jayda White/Extender: Tito Dine in Treatment: 58 Encounter Discharge Information Items Post Procedure Vitals Discharge Condition: Stable Temperature (F): 98.9 Ambulatory Status: Ambulatory Pulse (bpm): 73 Discharge Destination: Home Respiratory Rate (breaths/min): 18 Transportation: Private Auto Blood Pressure (mmHg): 149/69 Accompanied By: self Schedule Follow-up Appointment: Yes Clinical Summary of Care: Electronic Signature(s) Signed: 02/17/2020 11:11:47 AM By: [name], [name], RN, CWS, [name], [name] Entered By: [name], [name], RN, CWS, Kim on 01/27/2020 13:21:45 [name] -------------------------------------------------------------------------------- Lower Extremity Assessment Details Patient Name: [name]. Date of Service: 01/27/2020 12:45 PM Medical Record Number: [id number] Patient Account Number: [id number] Date of Birth/Sex: 17-Mar-1946 (74 y.o. F) Treating RN: Montey Hora Primary Care [name] Helms: [name] Other Clinician: Referring Jamill Wetmore: [name] Treating Ziyan Schoon/Extender: Tito Dine in Treatment: 59 Edema Assessment Assessed: [Left: No] [Right: No] Edema: [Left: Ye] [Right: s] Calf Left: Right: Point of Measurement: 33 cm From Medial Instep 43 cm cm Ankle Left: Right: Point of Measurement: 10 cm From Medial Instep 22.5 cm cm Vascular Assessment Pulses: Dorsalis Pedis Palpable: [Left:Yes] Electronic Signature(s) Signed: 01/27/2020 4:33:06 PM By: Montey Hora Entered By: Montey Hora on 01/27/2020 12:53:38 Oxley, [name] -------------------------------------------------------------------------------- Multi Wound Chart Details Patient Name: [name]. Date of Service: 01/27/2020 12:45 PM Medical Record Number: [id number] Patient Account Number: [id number] Date of Birth/Sex: [birth year] (74 y.o. F) Treating RN: Cornell Barman Primary Care Azaan Leask: [name]  Other Clinician: Referring Cierra Rothgeb: [name] Treating Erik Nessel/Extender: Tito Dine in Treatment: 58 Vital Signs Height(in): 36 Pulse(bpm): 8 Weight(lbs): 224.7 Blood Pressure(mmHg): 144/69 Body Mass Index(BMI): 40 Temperature(F): 98.9 Respiratory Rate(breaths/min): 18 Photos: Wound Location: Left Lower Leg - Lateral, Proximal Left Lower Leg - Medial Left Lower Leg - Lateral Wounding Event: Blister Gradually Appeared Gradually Appeared Primary Etiology: Venous Leg Ulcer Lymphedema Venous Leg Ulcer Comorbid History: Cataracts, Asthma, Sleep Apnea, Cataracts, Asthma, Sleep Apnea, Cataracts, Asthma, Sleep Apnea, Deep Vein Thrombosis, Deep Vein Thrombosis, Deep Vein Thrombosis, Hypertension, Peripheral Venous Hypertension,  Peripheral Venous Hypertension, Peripheral Venous Disease, Osteoarthritis, Received Disease, Osteoarthritis, Received Disease, Osteoarthritis, Received Chemotherapy, Received Radiation Chemotherapy, Received Radiation Chemotherapy, Received Radiation Date Acquired: 01/27/2020 11/19/2018 01/19/2019 Weeks of Treatment: 0 59 53 Wound Status: Open Open Open Measurements L x W x D (cm) 1.2x1.2x0.1 1.4x1.2x0.2 3.6x1.7x0.2 Area (cm) : 1.131 1.319 4.807 Volume (cm) : 0.113 0.264 0.961 % Reduction in Area: N/A 84.60% -2085.00% % Reduction in Volume: N/A 69.10% -4268.20% Classification: Full Thickness Without Exposed Full Thickness Without Exposed Full Thickness Without Exposed Support Structures Support Structures Support Structures Exudate Amount: Medium Medium Medium Exudate Type: Purulent Sanguinous Sanguinous Exudate Color: yellow, brown, green red red Wound Margin: Flat and Intact Flat and Intact Flat and Intact Granulation Amount: Medium (34-66%) Small (1-33%) Small (1-33%) Granulation Quality: Pink Pink Pink Necrotic Amount: Medium (34-66%) Large (67-100%) Large (67-100%) Exposed Structures: Fat Layer (Subcutaneous Tissue) Fat Layer  (Subcutaneous Tissue) Fat Layer (Subcutaneous Tissue) Exposed: Yes Exposed: Yes Exposed: Yes Fascia: No Fascia: No Fascia: No Tendon: No Tendon: No Tendon: No Muscle: No Muscle: No Muscle: No Joint: No Joint: No Joint: No Bone: No Bone: No Bone: No Epithelialization: Medium (34-66%) Small (1-33%) Small (1-33%) Debridement: N/A Debridement - Excisional Debridement - Excisional Pre-procedure Verification/Time N/A 13:14 13:14 Out Taken: Pain Control: N/A Lidocaine Lidocaine Tissue Debrided: N/A Subcutaneous, [employer], Cotter, Winnie. ([id number]) Level: N/A Skin/Subcutaneous Tissue Skin/Subcutaneous Tissue Debridement Area (sq cm): N/A 1.68 6.12 Instrument: N/A Curette Curette Bleeding: N/A Minimum Minimum Hemostasis Achieved: N/A Pressure Pressure Debridement Treatment N/A Procedure was tolerated well Procedure was tolerated well Response: Post Debridement Measurements N/A 1.4x1.2x0.3 3.6x1.7x0.2 L x W x D (cm) Post Debridement Volume: (cm) N/A 0.396 0.961 Procedures Performed: N/A Debridement Debridement Wound Number: 9 N/A N/A Photos: N/A N/A Wound Location: Left Lower Leg - Lateral, Posterior N/A N/A Wounding Event: Gradually Appeared N/A N/A Primary Etiology: Venous Leg Ulcer N/A N/A Comorbid History: Cataracts, Asthma, Sleep Apnea, N/A N/A Deep Vein Thrombosis, Hypertension, Peripheral Venous Disease, Osteoarthritis, Received Chemotherapy, Received Radiation Date Acquired: 07/08/2019 N/A N/A Weeks of Treatment: 29 N/A N/A Wound Status: Open N/A N/A Measurements L x W x D (cm) 0.7x0.5x0.2 N/A N/A Area (cm) : 0.275 N/A N/A Volume (cm) : 0.055 N/A N/A % Reduction in Area: 51.30% N/A N/A % Reduction in Volume: 3.50% N/A N/A Classification: Full Thickness Without Exposed N/A N/A Support Structures Exudate Amount: Medium N/A N/A Exudate Type: Sanguinous N/A N/A Exudate Color: red N/A N/A Wound Margin: Flat and Intact N/A N/A Granulation  Amount: Small (1-33%) N/A N/A Granulation Quality: Pink N/A N/A Necrotic Amount: Large (67-100%) N/A N/A Exposed Structures: Fat Layer (Subcutaneous Tissue) N/A N/A Exposed: Yes Fascia: No Tendon: No Muscle: No Joint: No Bone: No Epithelialization: Small (1-33%) N/A N/A Debridement: N/A N/A N/A Pain Control: N/A N/A N/A Tissue Debrided: N/A N/A N/A Level: N/A N/A N/A Debridement Area (sq cm): N/A N/A N/A Instrument: N/A N/A N/A Bleeding: N/A N/A N/A Hemostasis Achieved: N/A N/A N/A Debridement Treatment N/A N/A N/A Response: Post Debridement Measurements N/A N/A N/A L x W x D (cm) CARRINE, ENDRESS ([id number]) Post Debridement Volume: (cm) N/A N/A N/A Procedures Performed: N/A N/A N/A Treatment Notes Wound #10 (Left, Proximal, Lateral Lower Leg) Notes TCA, Alginate, ABD, 4 layer Wound #5 (Left, Medial Lower Leg) Notes TCA, Alginate, ABD, 4 layer Wound #6 (Left, Lateral Lower Leg) Notes TCA, Alginate, ABD, 4 layer Wound #9 (Left, Lateral, Posterior Lower Leg) Notes TCA, Alginate, ABD, 4 layer Electronic Signature(s) Signed: 01/28/2020 4:13:18  AM By: Linton Ham MD Entered By: Linton Ham on 01/27/2020 13:28:41 [name] ([id number]) -------------------------------------------------------------------------------- Multi-Disciplinary Care Plan Details Patient Name: ELY, BONIFAS. Date of Service: 01/27/2020 12:45 PM Medical Record Number: [id number] Patient Account Number: [id number] Date of Birth/Sex: 05-11-1946 (74 y.o. F) Treating RN: Cornell Barman Primary Care Crissa Sowder: [name] Other Clinician: Referring Allie Gerhold: [name] Treating Jermal Dismuke/Extender: Tito Dine in Treatment: 38 Active Inactive Medication Nursing Diagnoses: Knowledge deficit related to medication safety: actual or potential Goals: Patient/caregiver will demonstrate understanding of new oral/IV medications prescribed at the [facility] (topical prescriptions are  covered under the skin breakdown problem) Date Initiated: 12/16/2019 Target Resolution Date: 01/13/2020 Goal Status: Active Interventions: Assess for medication contraindications each visit where new medications are prescribed Treatment Activities: New medication prescribed at [facility] : 12/16/2019 Notes: Soft Tissue Infection Nursing Diagnoses: Impaired tissue integrity Goals: Patient's soft tissue infection will resolve Date Initiated: 12/10/2018 Target Resolution Date: 01/09/2019 Goal Status: Active Interventions: Assess signs and symptoms of infection every visit Notes: Venous Leg Ulcer Nursing Diagnoses: Actual venous Insuffiency (use after diagnosis is confirmed) Goals: Patient will maintain optimal edema control Date Initiated: 12/10/2018 Target Resolution Date: 01/09/2019 Goal Status: Active Interventions: Assess peripheral edema status every visit. Treatment Activities: Therapeutic compression applied : 12/10/2018 MELLISSA, THAU ([id number]) Notes: Wound/Skin Impairment Nursing Diagnoses: Impaired tissue integrity Goals: Patient/caregiver will verbalize understanding of skin care regimen Date Initiated: 12/10/2018 Target Resolution Date: 01/09/2019 Goal Status: Active Interventions: Assess ulceration(s) every visit Treatment Activities: Topical wound management initiated : 12/10/2018 Notes: Electronic Signature(s) Signed: 02/17/2020 11:11:47 AM By: [name], [name], RN, CWS, [name], [name] Entered By: [name], [name], RN, CWS, Kim on 01/27/2020 13:13:35 Barritt, [name] ([id number]) -------------------------------------------------------------------------------- Pain Assessment Details Patient Name: ANNECIA, BECTON. Date of Service: 01/27/2020 12:45 PM Medical Record Number: [id number] Patient Account Number: [id number] Date of Birth/Sex: 1946-05-10 (74 y.o. F) Treating RN: Montey Hora Primary Care Shakeisha Horine: [name] Other Clinician: Referring Leyda Vanderwerf: [name] Treating Skarlette Lattner/Extender: Tito Dine in Treatment: 76 Active Problems Location of Pain Severity and Description of Pain Patient Has Paino Yes Site Locations Pain Location: Generalized Pain, Pain in Ulcers With Dressing Change: Yes Pain Management and Medication Current Pain Management: Electronic Signature(s) Signed: 01/27/2020 4:33:06 PM By: Montey Hora Entered By: Montey Hora on 01/27/2020 12:51:36 Gaccione, [name] ([id number]) -------------------------------------------------------------------------------- Patient/Caregiver Education Details Patient Name: TASHARRA, HULETTE. Date of Service: 01/27/2020 12:45 PM Medical Record Number: [id number] Patient Account Number: [id number] Date of Birth/Gender: [birth year] (74 y.o. F) Treating RN: Cornell Barman Primary Care Physician: [name] Other Clinician: Referring Physician: [name] Treating Physician/Extender: Tito Dine in Treatment: 65 Education Assessment Education Provided To: Patient Education Topics Provided Wound/Skin Impairment: Handouts: Caring for Your Ulcer Methods: Demonstration, Explain/Verbal Responses: State content correctly Electronic Signature(s) Signed: 02/17/2020 11:11:47 AM By: [name], [name], RN, CWS, [name], [name] Entered By: [name], [name], RN, CWS, Kim on 01/27/2020 13:18:56 [name] ([id number]) -------------------------------------------------------------------------------- Wound Assessment Details Patient Name: TAMULA, CHIAPPONE. Date of Service: 01/27/2020 12:45 PM Medical Record Number: [id number] Patient Account Number: [id number] Date of Birth/Sex: 07-01-1946 (74 y.o. F) Treating RN: Montey Hora Primary Care Malika Demario: [name] Other Clinician: Referring Jaquanda Wickersham: [name] Treating Damarcus Reggio/Extender: Tito Dine in Treatment: 65 Wound Status Wound Number: 10 Primary Venous Leg Ulcer Etiology: Wound Location: Left  Lower Leg - Lateral, Proximal Wound Open Wounding Event: Blister Status: Date Acquired: 01/27/2020 Comorbid Cataracts, Asthma, Sleep Apnea, Deep Vein Thrombosis, Weeks Of Treatment:  0 History: Hypertension, Peripheral Venous Disease, Osteoarthritis, Clustered Wound: No Received Chemotherapy, Received Radiation Photos Wound Measurements Length: (cm) 1.2 Width: (cm) 1.2 Depth: (cm) 0.1 Area: (cm) 1.131 Volume: (cm) 0.113 % Reduction in Area: % Reduction in Volume: Epithelialization: Medium (34-66%) Tunneling: No Undermining: No Wound Description Classification: Full Thickness Without Exposed Support Structu Wound Margin: Flat and Intact Exudate Amount: Medium Exudate Type: Purulent Exudate Color: yellow, brown, green res Foul Odor After Cleansing: No Slough/Fibrino Yes Wound Bed Granulation Amount: Medium (34-66%) Exposed Structure Granulation Quality: Pink Fascia Exposed: No Necrotic Amount: Medium (34-66%) Fat Layer (Subcutaneous Tissue) Exposed: Yes Necrotic Quality: Adherent Slough Tendon Exposed: No Muscle Exposed: No Joint Exposed: No Bone Exposed: No Electronic Signature(s) Signed: 01/27/2020 4:33:06 PM By: Montey Hora Entered By: Montey Hora on 01/27/2020 12:59:10 Gillentine, [name] ([id number]) -------------------------------------------------------------------------------- Wound Assessment Details Patient Name: LANAIYA, GRIZZEL. Date of Service: 01/27/2020 12:45 PM Medical Record Number: [id number] Patient Account Number: [id number] Date of Birth/Sex: 22-Dec-1945 (74 y.o. F) Treating RN: Montey Hora Primary Care Arnell Mausolf: [name] Other Clinician: Referring Zeshan Sena: [name] Treating Jaycelyn Orrison/Extender: Tito Dine in Treatment: 60 Wound Status Wound Number: 5 Primary Lymphedema Etiology: Wound Location: Left Lower Leg - Medial Wound Open Wounding Event: Gradually Appeared Status: Date Acquired: 11/19/2018 Comorbid  Cataracts, Asthma, Sleep Apnea, Deep Vein Thrombosis, Weeks Of Treatment: 59 History: Hypertension, Peripheral Venous Disease, Osteoarthritis, Clustered Wound: No Received Chemotherapy, Received Radiation Photos Wound Measurements Length: (cm) 1.4 Width: (cm) 1.2 Depth: (cm) 0.2 Area: (cm) 1.319 Volume: (cm) 0.264 % Reduction in Area: 84.6% % Reduction in Volume: 69.1% Epithelialization: Small (1-33%) Tunneling: No Undermining: No Wound Description Classification: Full Thickness Without Exposed Support Structures Wound Margin: Flat and Intact Exudate Amount: Medium Exudate Type: Sanguinous Exudate Color: red Foul Odor After Cleansing: No Slough/Fibrino Yes Wound Bed Granulation Amount: Small (1-33%) Exposed Structure Granulation Quality: Pink Fascia Exposed: No Necrotic Amount: Large (67-100%) Fat Layer (Subcutaneous Tissue) Exposed: Yes Necrotic Quality: Adherent Slough Tendon Exposed: No Muscle Exposed: No Joint Exposed: No Bone Exposed: No Electronic Signature(s) Signed: 01/27/2020 4:33:06 PM By: Montey Hora Entered By: Montey Hora on 01/27/2020 13:00:17 Scrima, [name] ([id number]) -------------------------------------------------------------------------------- Wound Assessment Details Patient Name: ANGELIGUE, RAPPLEYEA. Date of Service: 01/27/2020 12:45 PM Medical Record Number: [id number] Patient Account Number: [id number] Date of Birth/Sex: [birth year] (74 y.o. F) Treating RN: Montey Hora Primary Care Renzo Vincelette: [name] Other Clinician: Referring Kendra Grissett: [name] Treating Larell Baney/Extender: Tito Dine in Treatment: 48 Wound Status Wound Number: 6 Primary Venous Leg Ulcer Etiology: Wound Location: Left Lower Leg - Lateral Wound Open Wounding Event: Gradually Appeared Status: Date Acquired: 01/19/2019 Comorbid Cataracts, Asthma, Sleep Apnea, Deep Vein Thrombosis, Weeks Of Treatment: 53 History: Hypertension, Peripheral  Venous Disease, Osteoarthritis, Clustered Wound: No Received Chemotherapy, Received Radiation Photos Wound Measurements Length: (cm) 3.6 Width: (cm) 1.7 Depth: (cm) 0.2 Area: (cm) 4.807 Volume: (cm) 0.961 % Reduction in Area: -2085% % Reduction in Volume: -4268.2% Epithelialization: Small (1-33%) Tunneling: No Undermining: No Wound Description Classification: Full Thickness Without Exposed Support Structu Wound Margin: Flat and Intact Exudate Amount: Medium Exudate Type: Sanguinous Exudate Color: red res Foul Odor After Cleansing: No Slough/Fibrino Yes Wound Bed Granulation Amount: Small (1-33%) Exposed Structure Granulation Quality: Pink Fascia Exposed: No Necrotic Amount: Large (67-100%) Fat Layer (Subcutaneous Tissue) Exposed: Yes Necrotic Quality: Adherent Slough Tendon Exposed: No Muscle Exposed: No Joint Exposed: No Bone Exposed: No Electronic Signature(s) Signed: 01/27/2020 4:33:06 PM By: Montey Hora Entered By: Montey Hora on 01/27/2020 13:01:12 Schneller,  [name] ([id number]) -------------------------------------------------------------------------------- Wound Assessment Details Patient Name: IZSABELLA, KUO. Date of Service: 01/27/2020 12:45 PM Medical Record Number: [id number] Patient Account Number: [id number] Date of Birth/Sex: 1946-07-11 (74 y.o. F) Treating RN: Montey Hora Primary Care Kip Cropp: [name] Other Clinician: Referring Ladean Steinmeyer: [name] Treating Javonne Dorko/Extender: Tito Dine in Treatment: 26 Wound Status Wound Number: 9 Primary Venous Leg Ulcer Etiology: Wound Location: Left Lower Leg - Lateral, Posterior Wound Open Wounding Event: Gradually Appeared Status: Date Acquired: 07/08/2019 Comorbid Cataracts, Asthma, Sleep Apnea, Deep Vein Thrombosis, Weeks Of Treatment: 29 History: Hypertension, Peripheral Venous Disease, Osteoarthritis, Clustered Wound: No Received Chemotherapy, Received  Radiation Photos Wound Measurements Length: (cm) 0.7 Width: (cm) 0.5 Depth: (cm) 0.2 Area: (cm) 0.275 Volume: (cm) 0.055 % Reduction in Area: 51.3% % Reduction in Volume: 3.5% Epithelialization: Small (1-33%) Tunneling: No Undermining: No Wound Description Classification: Full Thickness Without Exposed Support Structu Wound Margin: Flat and Intact Exudate Amount: Medium Exudate Type: Sanguinous Exudate Color: red res Foul Odor After Cleansing: No Slough/Fibrino Yes Wound Bed Granulation Amount: Small (1-33%) Exposed Structure Granulation Quality: Pink Fascia Exposed: No Necrotic Amount: Large (67-100%) Fat Layer (Subcutaneous Tissue) Exposed: Yes Necrotic Quality: Adherent Slough Tendon Exposed: No Muscle Exposed: No Joint Exposed: No Bone Exposed: No Electronic Signature(s) Signed: 01/27/2020 4:33:06 PM By: Montey Hora Entered By: Montey Hora on 01/27/2020 13:02:07 Freilich, [name] ([id number]) -------------------------------------------------------------------------------- Vitals Details Patient Name: ABRIENNE, TURRILL. Date of Service: 01/27/2020 12:45 PM Medical Record Number: [id number] Patient Account Number: [id number] Date of Birth/Sex: [birth year] (74 y.o. F) Treating RN: Cornell Barman Primary Care Landy Mace: [name] Other Clinician: Referring Jalynn Betzold: [name] Treating Lavontae Cornia/Extender: Tito Dine in Treatment: 59 Vital Signs Time Taken: 12:45 Temperature (F): 98.9 Height (in): 63 Pulse (bpm): 73 Weight (lbs): 224.7 Respiratory Rate (breaths/min): 18 Body Mass Index (BMI): 39.8 Blood Pressure (mmHg): 144/69 Reference Range: 80 - 120 mg / dl Electronic Signature(s) Signed: 01/27/2020 4:05:17 PM By: Lorine Bears RCP, RRT, CHT Entered By: Lorine Bears on 01/27/2020 12:48:27

## 2020-02-17 NOTE — Progress Notes (Signed)
MORAYO, JUNIUS ([id number]) Visit Report for 02/17/2020 Debridement Details Patient Name: [name], [name]. Date of Service: 02/17/2020 1:15 PM Medical Record Number: [id number] Patient Account Number: [id number] Date of Birth/Sex: 10-03-1946 (74 y.o. F) Treating RN: Cornell Barman Primary Care Provider: [name] Other Clinician: Referring Provider: [name] Treating Provider/Extender: Tito Dine in Treatment: 62 Debridement Performed for Wound #5 Left,Medial Lower Leg Assessment: Performed By: Physician [name] Dillon, MD Debridement Type: Debridement Level of Consciousness (Pre- Awake and Alert procedure): Pre-procedure Verification/Time Out Yes - 13:52 Taken: Start Time: 13:52 Total Area Debrided (L x W): 0.3 (cm) x 0.4 (cm) = 0.12 (cm) Tissue and other material debrided: Non-Viable, Eschar Level: Non-Viable Tissue Debridement Description: Selective/Open Wound Instrument: Curette Bleeding: Moderate Hemostasis Achieved: Pressure End Time: 13:53 Response to Treatment: Procedure was tolerated well Level of Consciousness (Post- Awake and Alert procedure): Post Debridement Measurements of Total Wound Length: (cm) 0.3 Width: (cm) 0.4 Depth: (cm) 0.1 Volume: (cm) 0.009 Character of Wound/Ulcer Post Debridement: Stable Post Procedure Diagnosis Same as Pre-procedure Electronic Signature(s) Signed: 02/17/2020 4:08:23 PM By: Linton Ham MD Signed: 02/17/2020 4:30:28 PM By: [name], [name], RN, CWS, [name], [name] Entered By: Linton Ham on 02/17/2020 14:01:33 Dearing, [name] -------------------------------------------------------------------------------- Debridement Details Patient Name: [name], [name]. Date of Service: 02/17/2020 1:15 PM Medical Record Number: [id number] Patient Account Number: [id number] Date of Birth/Sex: 1946/10/30 (74 y.o. F) Treating RN: Cornell Barman Primary Care Provider: [name] Other  Clinician: Referring Provider: [name] Treating Provider/Extender: Tito Dine in Treatment: 62 Debridement Performed for Wound #6 Left,Lateral Lower Leg Assessment: Performed By: Physician [name] Dillon, MD Debridement Type: Debridement Severity of Tissue Pre Debridement: Fat layer exposed Level of Consciousness (Pre- Awake and Alert procedure): Pre-procedure Verification/Time Out Yes - 13:52 Taken: Start Time: 13:52 Pain Control: Lidocaine Total Area Debrided (L x W): 0.7 (cm) x 0.6 (cm) = 0.42 (cm) Tissue and other material debrided: Non-Viable, Eschar Level: Non-Viable Tissue Debridement Description: Selective/Open Wound Instrument: Curette Bleeding: Moderate Hemostasis Achieved: Pressure End Time: 13:53 Response to Treatment: Procedure was tolerated well Level of Consciousness (Post- Awake and Alert procedure): Post Debridement Measurements of Total Wound Length: (cm) 0.7 Width: (cm) 0.6 Depth: (cm) 0.1 Volume: (cm) 0.033 Character of Wound/Ulcer Post Debridement: Stable Severity of Tissue Post Debridement: Fat layer exposed Post Procedure Diagnosis Same as Pre-procedure Electronic Signature(s) Signed: 02/17/2020 4:08:23 PM By: Linton Ham MD Signed: 02/17/2020 4:30:28 PM By: [name], [name], RN, CWS, [name], [name] Entered By: Linton Ham on 02/17/2020 14:01:46 [place], [name] -------------------------------------------------------------------------------- HPI Details Patient Name: [name], [name]. Date of Service: 02/17/2020 1:15 PM Medical Record Number: [id number] Patient Account Number: [id number] Date of Birth/Sex: 05/24/1946 (74 y.o. F) Treating RN: Cornell Barman Primary Care Provider: [name] Other Clinician: Referring Provider: [name] Treating Provider/Extender: Tito Dine in Treatment: 22 History of Present Illness HPI Description: Pleasant 74 year old with history of chronic  venous insufficiency. No diabetes or peripheral vascular disease. Left ABI 1.29. Questionable history of left lower extremity DVT. She developed a recurrent ulceration on her left lateral calf in December 2015, which she attributes to poor diet and subsequent lower extremity edema. She underwent endovenous laser ablation of her left greater saphenous vein in 2010. She underwent laser ablation of accessory branch of left GSV in April 2016 by Dr. Kellie Simmering at [facility]. She was previously wearing Unna boots, which she tolerated well. Tolerating 2 layer compression and cadexomer iodine. She returns to clinic  for follow-up and is without new complaints. She denies any significant pain at this time. She reports persistent pain with pressure. No claudication or ischemic rest pain. No fever or chills. No drainage. READMISSION 11/13/16; this is a 74 year old woman who is not a diabetic. She is here for a review of a painful area on her left medial lower extremity. I note that she was seen here previously last year for wound I believe to be in the same area. At that time she had undergone previously a left greater saphenous vein ablation by Dr. Kellie Simmering and she had a ablation of the anterior accessory branch of the left greater saphenous vein in March 2016. Seeing that the wound actually closed over. In reviewing the history with her today the ulcer in this area has been recurrent. She describes a biopsy of this area in 2009 that only showed stasis physiology. 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 Do not see that she has ever had arterial studies in the left leg. She tells me she has a follow-up with Dr. Kellie Simmering in roughly 10 days In any case she developed the reopening of this area roughly a  month ago. On the background of this she describes rapidly increasing edema which has responded to Lasix 40 mg and metolazone 2.5 mg as well as the patient's lymph massage. She has been told she has both venous insufficiency and lymphedema but she cannot tolerate compression stockings 11/28/16; the patient saw Dr. Kellie Simmering recently. Per the patient he did arterial Dopplers in the office that did not show evidence of arterial insufficiency, per the patient he stated "treat this like an ordinary venous ulcer". She also saw her dermatologist Dr. Ronnald Ramp who felt that this was more of a vascular ulcer. In general things are improving although she arrives today with increasing bilateral lower extremity edema with weeping a deeper fluid through the wound on the left medial leg compatible with some degree of lymphedema 12/04/16; the patient's wound is fully epithelialized but I don't think fully healed. We will do another week of depression with Promogran and TCA however I suspect we'll be able to discharge her next week. This is a very unusual-looking wound which was initially a figure-of-eight type wound lying on its side surrounded by petechial like hemorrhage. She has had venous ablation on this side. She apparently does not have an arterial issue per Dr. Kellie Simmering. She saw her dermatologist thought it was "vascular". Patient is definitely going to need ongoing compression and I talked about this with her today she will go to elastic therapy after she leaves here next week 12/11/16; the patient's wound is not completely closed today. She has surrounding scar tissue and in further discussion with the patient it would appear that she had ulcers in this area in 2009 for a prolonged period of time ultimately requiring a punch biopsy of this area that only showed venous insufficiency. I did not previously pickup on this part of the history from the patient. 12/18/16; the patient's wound is completely epithelialized.  There is no open area here. She has significant bilateral venous insufficiency with secondary lymphedema to a mild-to-moderate degree she does not have compression stockings.. She did not say anything to me when I was in the room, she  told our intake nurse that she was still having pain in this area. This isn't unusual recurrent small open area. She is going to go to elastic therapy to obtain compression stockings. 12/25/16; the patient's wound is fully epithelialized. There is no open area here. The patient describes some continued episodic discomfort in this area medial left calf. However everything looks fine and healed here. She is been to elastic therapy and caught herself 15-20 mmHg stockings, they apparently were having trouble getting 20-30 mm stockings in her size 01/22/17; this is a patient we discharged from the clinic a month ago. She has a recurrent open wound on her medial left calf. She had 15 mm support stockings. I told her I thought she needed 20-30 mm compression stockings. She tells me that she has been ill with hospitalization secondary to asthma and is been found to have severe hypokalemia likely secondary to a combination of Lasix and metolazone. This morning she noted blistering and leaking fluid on the posterior part of her left leg. She called our intake nurse urgently and we was saw her this afternoon. She has not had any real discomfort here. I don't know that she's been wearing any stockings on this leg for at least 2-3 days. ABIs in this clinic were 1.21 on the right and 1.3 on the left. She is previously seen vascular surgery who does not think that there is a peripheral arterial issue. 01/30/17; Patient arrives with no open wound on the left leg. She has been to elastic therapy and obtained 20-25mmhg below knee stockings and she has one on the right leg today. READMISSION 02/19/18; this Pipkin is a now 74 year old patient we've had in this clinic perhaps 3 times before. I  had last looked at her from January 07 December 2016 with an area on the medial left leg. We discharged her on 12/25/16 however she had to be readmitted on 01/22/17 with a recurrence. I have in my notes that we discharged her on 20-30 mm stockings although she tells me she was only wearing support hose because she cannot get stockings on predominantly related to her cervical spine surgery/issues. She has had previous ablations done by vein and vascular in [place] including a great saphenous vein ablation on the left with an anterior accessory branch ablation I think both of these were in 2016. On one of the previous visit she had a biopsy noted 2009 that was negative. She is not felt to have an arterial issue. She is not a diabetic. She does have a history of obstructive sleep apnea hypertension asthma as well as chronic venous insufficiency and lymphedema. [name], [name] On this occasion she noted 2 dry scaly patch on her left leg. She tried to put lotion on this it didn't really help. There were 2 open areas.the patient has been seeing her primary physician from 02/05/18 through 02/14/18. She had Unna boots applied. The superior wound now on the lateral left leg has closed but she's had one wound that remains open on the lateral left leg. This is not the same spot as we dealt with in 2018. ABIs in this clinic were 1.3 bilaterally 02/26/18; patient has a small wound on the left lateral calf. Dimensions are down. She has chronic venous insufficiency and lymphedema. 03/05/18; small open area on the left lateral calf. Dimensions are down. Tightly adherent necrotic debris over the surface of the wound which was difficult to remove. Also the dressing [over collagen] stuck to the wound surface.  This was removed with some difficulty as well. Change the primary dressing to Hydrofera Blue ready 03/12/18; small open area on the left lateral calf. Comes in with tightly adherent surface eschar as well as  some adherent Hydrofera Blue. 03/19/18; open area on the left lateral calf. Again adherent surface eschar as well as some adherent Hydrofera Blue nonviable subcutaneous tissue. She complained of pain all week even with the reduction from 4-3 layer compression I put on last week. Also she had an increase in her ankle and calf measurements probably related to the same thing. 03/26/18; open area on the left lateral calf. A very small open area remains here. We used silver alginate starting last week as the Hydrofera Blue seem to stick to the wound bed. In using 4-layer compression 04/02/18; the open area in the left lateral calf at some adherent slough which I removed there is no open area here. We are able to transition her into her own compression stocking. Truthfully I think this is probably his support hose. However this does not maintain skin integrity will be limited. She cannot put over the toe compression stockings on because of neck problems hand problems etc. She is allergic to the lining layer of juxta lites. We might be forced to use extremitease stocking should this fail READMIT 11/24/2018 Patient is now a 74 year old woman who is not a diabetic. She has been in this clinic on at least 3 previous occasions largely with recurrent wounds on her left leg secondary to chronic venous insufficiency with secondary lymphedema. Her situation is complicated by inability to get stockings on and an allergy to neoprene which is apparently a component and at least juxta lites and other stockings. As a result she really has not been wearing any stockings on her legs. She tells [place] that roughly 2 or 3 weeks ago she started noticing a stinging sensation just above her ankle on the left medial aspect. She has been diagnosed with pseudogout and she wondered whether this was what she was experiencing. She tried to dress this with something she bought at the store however subsequently it pulled skin off and now  she has an open wound that is not improving. She has been using Vaseline gauze with a cover bandage. She saw her primary doctor last week who put an Haematologist on her. ABIs in this clinic was 1.03 on the left 2/12; the area is on the left medial ankle. Odd-looking wound with what looks to be surface epithelialization but a multitude of small petechial openings. This clearly not closed yet. We have been using silver alginate under 3 layer compression with TCA 2/19; the wound area did not look quite as good this week. Necrotic debris over the majority of the wound surface which required debridement. She continues to have a multitude of what looked to be small petechial openings. She reminds [place] that she had a biopsy on this initially during her first outbreak in 2015 in [place] [facility]. She expresses concern about this being a possible melanoma. 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 I assured her that this does not look anything like melanoma. Except for the petechial reaction it does look like a venous insufficiency area and she certainly has evidence of this on both sides 2/26; a difficult area on the left medial ankle. The patient clearly has chronic venous hypertension with some degree of lymphedema. The odd thing about  the area is the small petechial hemorrhages. I am not really sure how to explain this. This was present last time and this is not a compression injury. We have been using Hydrofera Blue which I changed to last week 3/4; still using Hydrofera Blue. Aggressive debridement today. She does not have known arterial issues. She has seen Dr. Kellie Simmering at [facility] vein and vascular and and has an ablation on the left. [Anterior accessory branch of the greater saphenous]. From what I remember they did not feel she had an arterial issue. The patient has had this area biopsied in 2009 at [facility]  dermatology and by her recollection they said this was "stasis". She is also follow-up with dermatology locally who thought that this was more of a vascular issue 3/11; using Hydrofera Blue. Aggressive debridement today. She does not have an arterial issue. We are using 3 layer compression although we may need to go to 4. The patient has been in for multiple changes to her wrap since I last saw her a week ago. She says that the area was leaking. I do not have too much more information on what was found 01/19/19 on evaluation today patient was actually being seen for a nurse visit when unfortunately she had the area on her left lateral lower extremity as well as weeping from the right lower extremity that became apparent. Therefore we did end up actually seeing her for a full visit with myself. She is having some pain at this site as well but fortunately nothing too significant at this point. No fevers, chills, nausea, or vomiting noted at this time. 3/18-Patient is back to the clinic with the left leg venous leg ulcer, the ulcer is larger in size, has a surface that is densely adherent with fibrinous tissue, the Hydrofera Blue was used but is densely adherent and there was difficulty in removing it. The right lower extremity was also wrapped for weeping edema. Patient has a new area over the left lateral foot above the malleolus that is small and appears to have no debris with intact surrounding skin. Patient is on increased dose of Lasix also as a means to edema management 3/25; the patient has a nonhealing venous ulcer on the medial left leg and last week developed a smaller area on the lateral left calf. We have been using Hydrofera Blue with a contact layer. 4/1; no major change in these wounds areas. Left medial and more recently left lateral calf. I tried Iodoflex last week to aid in debridement she did not tolerate this. She stated her pain was terrible all week. She took the top layer of the 4  layer compression off. 4/8; the patient actually looks somewhat better in terms of her more prominent left lateral calf wound. There is some healthy looking tissue here. She is still complaining of a lot of discomfort. 4/15; patient in a lot of pain secondary to sciatica. She is on a prednisone taper prescribed by her primary physician. She has the 2 areas one on the left medial and more recently a smaller area on the left lateral calf. Both of these just above the malleoli 4/22; her back pain is better but she still states she is very uncomfortable and now feels she is intolerant to the [employer]. No real change in the wounds we have been using Sorbact. She has been previously intolerant to Iodoflex. There is not a lot of option about what we can use to debride this wound under compression that  she no doubt needs. sHe states Ultram no longer works for her pain [name], [name] 4/29; no major change in the wounds slightly increased depth. Surface on the original medial wound perhaps somewhat improved however the more recent area on the lateral left ankle is 100% covered in very adherent debris we have been using Sorbact. She tolerates 4 layer compression well and her edema control is a lot better. She has not had to come in for a nurse check 5/6; no major change in the condition of the wounds. She did consent to debridement today which was done with some difficulty. Continuing Sorbact. She did not tolerate Iodoflex. She was in for a check of her compression the day after we wrapped her last week this was adjusted but nothing much was found 5/13; no major change in the condition or area of the wounds. I was able to get a fairly aggressive debridement done on the lateral left leg wound. Even using Sorbact under compression. She came back in on Friday to have the wrap changed. She says she felt uncomfortable on the lateral aspect of her ankle. She has a long history of chronic venous  insufficiency including previous ablation surgery on this side. 5/20-Patient returns for wounds on left leg with both wounds covered in slough, with the lateral leg wound larger in size, she has been in 3 layer compression and felt more comfortable, she describes pain in ankle, in leg and pins and needles in foot, and is about to try Pamelor for this 6/3; wounds on the left lateral and left medial leg. The area medially which is the most recent of the 2 seems to have had the largest increase in dimensions. We have been using Sorbac to try and debride the surface. She has been to see orthopedics they apparently did a plain x-ray that was indeterminant. Diagnosed her with neuropathy and they have ordered an MRI to determine if there is underlying osteomyelitis. This was not high on my thought list but I suppose it is prudent. We have advised her to make an appointment with vein and vascular in [place]. She has a history of a left greater saphenous and accessory vein ablations I wonder if there is anything else that can be done from a surgical point of view to help in these difficult refractory wounds. We have previously healed this wound on one occasion but it keeps on reopening [medial side] 6/10; deep tissue culture I did last week I think on the left medial wound showed both moderate E. coli and moderate staph aureus [MSSA]. She is going to require antibiotics and I have chosen Augmentin. We have been using Sorbact and we have made better looking wound surface on both sides but certainly no improvement in wound area. She was back in last Friday apparently for a dressing changes the wrap was hurting her outer left ankle. She has not managed to get a hold of vein and vascular in [place]. We are going to have to make her that appointment 6/17; patient is tolerating the Augmentin. She had an MRI that I think was ordered by orthopedic surgeon this did not show osteomyelitis or an abscess did suggest  cellulitis. We have been using Sorbact to the lateral and medial ankles. We have been trying to arrange a follow-up appointment with vein and vascular in [place] or did her original ablations. We apparently an area sent the request to vein and vascular in [facility] 6/24; patient has completed the Augmentin. We  do not yet have a vein and vascular appointment in [place]. I am not sure what the issue is here we have asked her to call tomorrow. We are using Sorbact. Making some improvements and especially the medial wound. Both surfaces however look better medial and lateral. 7/1; the patient has been in contact with vein and vascular in [place] but has not yet received an appointment. Using Sorbact we have gradually improve the wound surface with no improvement in surface area. She is approved for Apligraf but the wound surface still is not completely viable. She has not had to come in for a dressing change 7/8; the patient has an appointment with vein and vascular on 7/31 which is a Friday afternoon. She is concerned about getting back here for [place] to dress her wounds. I think it is important to have them goal for her venous reflux/history of ablations etc. to see if anything else can be done. She apparently tested positive for 1 of the blood tests with regards to lupus and saw a rheumatologist. He has raised the issue of vasculitis again. I have had this thought in the past however the evidence seems overwhelming that this is a venous reflux etiology. If the rheumatologist tells me there is clinical and laboratory investigation is positive for lupus I will rethink this. 7/15; the patient's wound surfaces are quite a bit better. The medial area which was her original wound now has no depth although the lateral wound which was the more recent area actually appears larger. Both with viable surfaces which is indeed better. Using Sorbact. I wanted to use Apligraf on her however there is the issue  of the vein and vascular appointment on 7/31 at 2:00 in the afternoon which would not allow her to get back to be rewrapped and they would no doubt remove the graft 7/22; the patient's wound surfaces have moderate amount of debris although generally look better. The lateral one is larger with 2 small satellite areas superiorly. We are waiting for her vein and vascular appointment on 7/31. She has been approved for Apligraf which I would like to use after th 7/29; wound surfaces have improved no debridement is required we have been using Sorbact. She sees vein and vascular on Friday with this so question of whether anything can be done to lessen the likelihood of recurrence and/or speed the healing of these areas. She is already had previous ablations. She no doubt has severe venous hypertension 8/5-Patient returns at 1 week, she was in [place] for 3 days by her podiatrist, we have been using so backed to the wound, she has increased pain in both the wounds on the left lower leg especially the more distal one on the lateral aspect 8/12-Patient returns at 1 week and she is agreeable to having debridement in both wounds on her left leg today. We have been using Sorbact, and vascular studies were reviewed at last visit 8/19; the patient arrives with her wounds fairly clean and no debridement is required. We have used Sorbact which is really done a nice job in cleaning up these very difficult wound surfaces. The patient saw Dr. Donzetta Matters of vascular surgery on 7/31. He did not feel that there was an arterial component. He felt that her treated greater saphenous vein is adequately addressed and that the small saphenous vein did not appear to be involved significantly. She was also noted to have deep venous reflux which is not treatable. Dr. Donzetta Matters mentioned the possibility of a  central obstructive component leading to reflux and he offered her central venography. She wanted to discuss this or think about it. I  have urged her to go ahead with this. She has had recurrent difficult wounds in these areas which do heal but after months in the clinic. If there is anything that can be done to reduce the likelihood of this I think it is worth it. 9/2 she is still working towards getting follow-up with Dr. Donzetta Matters to schedule her CT. Things are quite a bit worse venography. I put Apligraf on 2 weeks ago on both wounds on the medial and lateral part of her left lower leg. She arrives in clinic today with 3 superficial additional wounds above the area laterally and one below the wound medially. She describes a lot of discomfort. I think these are probably wrapped injuries. Does not look like she has cellulitis. 07/20/2019 on evaluation today patient appears to be doing somewhat poorly in regard to her lower extremity ulcers. She in fact showed signs of erythema in fact we may even be dealing with an infection at this time. Unfortunately I am unsure if this is just infection or if indeed there may be some allergic reaction that occurred as a result of the Apligraf application. With that being said that would be unusual but nonetheless not impossible in this patient is one who is unfortunately allergic to quite a bit. Currently we have been using the Sorbact which seems to do as well as anything for her. I do think we may want to obtain a culture today to see if there is anything showing up there that may need to be addressed. 9/16; noted that last week the wounds look worse in 1 week follow-up of the Apligraf. Using Sorbact as of 2 days ago. She arrives with copious amounts of drainage and new skin breakdown on the back of the left calf. The wounds arm more substantial bilaterally. There is a fair amount of swelling in the left calf no overt DVT there is edema present I think in the left greater than right thigh. She is supposed to go on 9/28 for CT HERMA, GEFFERT. ([id number]) venography. The wounds on the medial and  lateral calf are worse and she has new skin breakdown posteriorly at least new for me. This is almost developing into a circumferential wound area The Apligraf was taken off last week which I agree with things are not going in the right direction a culture was done we do not have that back yet. She is on Augmentin that she started 2 days ago 9/23; dressing was changed by her nurses on Monday. In general there is no improvement in the wound areas although the area looks less angry than last week. She did get Augmentin for MSSA cultured on the 14th. She still appears to have too much swelling in the left leg even with 3 layer compression 9/30; the patient underwent her procedure on 9/28 by Dr. Donzetta Matters at [facility]. She was discovered to have the common iliac vein measuring 12.2 mm but at the level of L4-L5 measured 3 mm. After stenting it measured 10 mm. 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 Rouleaux flow in the common femoral and femoral vein was observed much improved after stenting. We are using silver alginate to the wounds on the medial and lateral ankle on the left. 4 layer compression 10/7; the patient had fluid swelling around her knee and 4 layer  compression. At the advice of vein and vascular this was reduced to 3 layer which she is tolerating better. We have been using silver alginate under 3 layer compression since last Friday 10/14; arrives with the areas on the left ankle looking a lot better. Inflammation in the area also a lot better. She came in for a nurse check on 10/9 10/21; continued nice improvement. Slight improvements in surface area of both the medial and lateral wounds on the left. A lot of the satellite lesions in the weeping erythema around these from stasis dermatitis is resolved. We have been using silver alginate 10/28; general improvement in the entire wound areas although not a lot of change in dimensions the wound certainly looks  better. There is a lot less in terms of venous inflammation. Continue silver alginate this week however look towards Hydrofera Blue next week 11/4; very adherent debris on the medial wound left wound is not as bad. We have been using silver alginate. Change to [facility] today 11/11; very adherent debris on both wound areas. She went to vein and vascular last week and follow-up they put in [place] boot on this today. He says the [facility] was adherent. Wound is definitely not as good as last week. Especially on the left there the satellite lesions look more prominent 11/18; absolutely no better. erythema on lateral aspect with tenderness. 09/30/2019 on evaluation today patient appears to actually be doing better. Dr. Dellia Nims did put her on doxycycline last week which I do believe has helped her at this point. Fortunately there is no signs of active infection at this time. No fevers, chills, nausea, vomiting, or diarrhea. I do believe he may want extend the doxycycline for 7 additional days just to ensure everything does completely cleared up the patient is in agreement with that plan. Otherwise she is going require some sharp debridement today 12/2; patient is completing a 2-week course of doxycycline. I gave her this empirically for inflammation as well as infection when I last saw her 2 weeks ago. All of this seems to be better. She is using silver alginate she has the area on the medial aspect of the larger area laterally and the 2 small satellite regions laterally above the major wound. 12/9; the patient's wound on the left medial and left lateral calf look really quite good. We have been using silver alginate. She saw vein and vascular in follow-up on 10/09/2019. She has had a previous left greater saphenous vein ablation by Dr. Oscar La in 2016. More recently she underwent a left common iliac vein stent by Dr. 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 The swelling is improved and  certainly the wounds have improved. The patient shows [place] today area on the right medial calf there is almost no wound but leaking lymphedema. She says she start this started 3 or 4 days ago. She did not traumatize it. It is not painful. She does not wear compression on that side 12/16; the patient continues to do well laterally. Medially still requiring debridement. The area on the right calf did not materialize to anything and is not currently open. We wrapped this last time. She has support stockings for that leg although I am not sure they are going to provide adequate compression 12/23; the lateral wound looks stable. Medially still requiring debridement for tightly adherent fibrinous debris. We've been using silver alginate. Surface area not any different 12/30; neither wound is any better with regards to surface and the  area on the left lateral is larger. I been using silver alginate to the left lateral which look quite good last week and Sorbact to the left medial 11/11/2019. Lateral wound area actually looks better and somewhat smaller. Medial still requires a very aggressive debridement today. We have been using Sorbact on both wound areas 1/13; not much better still adherent debris bilaterally. I been using Sorbact. She has severe venous hypertension. Probably some degree of dermal fibrosis distally. I wonder whether tighter compression might help and I am going to try that today. We also need to work on the bioburden 1/20; using Sorbact. She has severe venous hypertension status post stent placement for pelvic vein compression. We applied gentamicin last time to see if we could reduce bioburden I had some discussion with her today about the use of pentoxifylline. This is occasionally used in this setting for wounds with refractory venous insufficiency. However this interacts with Plavix. She tells me that she was put on this after stent placement for 3 months. She will call Dr. Claretha Cooper  office to discuss 1/27; we are using gentamicin under Sorbact. 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 She has a stent. Wound medially is measuring smaller this week. Laterally measuring slightly larger although she has some satellite lesions superiorly 2/3; gentamicin under Sorbact under 4-layer compression. 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 She has a stent on Plavix. Her wounds are measuring smaller this week. More substantially laterally where there is a satellite lesion superiorly. 2/10; gentamicin under Sorbac. 4-layer compression. Patient communicated with Dr. Donzetta Matters at vein and vascular in [place]. He is okay with the patient coming off Plavix I will therefore start her on pentoxifylline for a 1 month trial. In general her wounds look better today. I had some concerns about swelling in the left thigh however she measures 61.5 on the right and 63 on the mid thigh which does not suggest there is any difficulty. The patient is not describing any pain. 2/17; gentamicin under Sorbac 4-layer compression. She has been on pentoxifylline for 1 week and complains of loose stool. No nausea she is eating and drinking well 2/24; the patient apparently came in 2 days ago for a nurse visit when her wrap fell down. Both areas look a little worse this week macerated medially and satellite lesions laterally. Change to silver alginate today 3/3; wounds are larger today especially medially. She also has more swelling in her foot lower leg and I even noted some swelling in her posterior thigh which is tender. I wonder about the patency of her stent. Fortuitously she sees Dr. Claretha Cooper group on Friday 3/10; Mrs. Maury was seen by vein and vascular on 3/5. The patient underwent ultrasound. There was no evidence of thrombosis involving the IVC no evidence of thrombosis involving the right common iliac vein there is no evidence of thrombosis involving the  right external iliac vein the left external Strader, Brandan J. ([id number]) vein is also patent. The right common iliac vein stent appears patent bilateral common femoral veins are compressible and appear patent. I was concerned about the left common iliac stent however it looks like this is functional. She has some edema in the posterior thigh that was tender she still has that this week. I also note they had trouble finding the pulses in her left foot and booked her for an ABI baseline in 4 weeks. She will follow up in 6 months for repeat IVC duplex. The patient stopped the  pentoxifylline because of diarrhea. It does not look like that was being effective in any case. I have advised her to go back on her aspirin 81 mg tablet, vascular it also suggested this 3/17; comes in today with her wound surfaces a lot better. The excoriations from last week considerably better probably secondary to the TCA. We have been using silver alginate 3/24; comes in today with smaller wounds both medially and laterally. Both required debridement. There are 2 small satellite areas superiorly laterally. She also has a very odd bandlike area in the mid calf almost looking like there was a weakness in the wrap in a localized area. I would write this off as being this however anteriorly she has a small raised ballotable area that is very tender almost reminiscent of an abscess but there was no obvious purulent surface to it. 02/04/20 upon evaluation today patient appears to be doing fairly well in regard to her wounds today. Fortunately there is no signs of active infection at this time. No fevers, chills, nausea, vomiting, or diarrhea. She has been tolerating the dressing changes without complication. Fortunately I feel like she is showing signs of improvement although has been sometime since have seen her. Nonetheless the area of concern that Dr. Dellia Nims had last week where she had possibly an area of the wrap that was we can  allow the leg to bulge appears to be doing significantly better today there is no signs of anything worsening. 4/7; the patient's wounds on her medial and lateral left leg continue to contract. We have been using a regular alginate. Last week she developed an area on the right medial lower leg which is probably a venous ulcer as well. 4/14; the wounds on her left medial and lateral lower leg continue to contract. Surface eschar. We have been using regular alginate. The area on the right medial lower leg is closed. We have been putting both legs under 4-layer contraction. The patient went back to see vein and vascular she had arterial studies done which were apparently "quite good" per the patient although I have not read their notes I have never felt she had an arterial issue. 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 This is been longstanding and refractory to exercise, leg elevation and longstanding use of compression wraps in our clinic as well as compression stockings on the times we have been able to get these to heal Electronic Signature(s) Signed: 02/17/2020 4:08:23 PM By: Linton Ham MD Entered By: Linton Ham on 02/17/2020 14:03:27 [name] -------------------------------------------------------------------------------- Physical Exam Details Patient Name: [name], [name]. Date of Service: 02/17/2020 1:15 PM Medical Record Number: [id number] Patient Account Number: [id number] Date of Birth/Sex: 05-02-1946 (74 y.o. F) Treating RN: Cornell Barman Primary Care Provider: [name] Other Clinician: Referring Provider: [name] Treating Provider/Extender: Tito Dine in Treatment: 62 Cardiovascular Pedal pulses pedal pulses are palpable bilaterally. Patient's edema is fairly well controlled. Notes Wound exam; both wounds are on the left medial and left lateral are very small. Surface eschar  and debris removed with a #5 curette the wounds look healthy very small and may be closed by next week her edema control is quite reasonable. oOn the right medial there was a wound that we defined 2 weeks ago I think this is mostly closed I did not debride this area. Electronic Signature(s) Signed: 02/17/2020 4:08:23 PM By: Linton Ham MD Entered By: Linton Ham on 02/17/2020 [place], [name] --------------------------------------------------------------------------------  Physician Orders Details Patient Name: [name], [name]. Date of Service: 02/17/2020 1:15 PM Medical Record Number: [id number] Patient Account Number: [id number] Date of Birth/Sex: 1946/05/31 (74 y.o. F) Treating RN: Cornell Barman Primary Care Provider: [name] Other Clinician: Referring Provider: [name] Treating Provider/Extender: Tito Dine in Treatment: 87 Verbal / Phone Orders: No Diagnosis Coding Anesthetic (add to Medication List) Wound #10 Left,Proximal,Lateral Lower Leg o Topical Lidocaine 4% cream applied to wound bed prior to debridement (In Clinic Only). Wound #11 Right,Medial Lower Leg o Topical Lidocaine 4% cream applied to wound bed prior to debridement (In Clinic Only). Wound #5 Left,Medial Lower Leg o Topical Lidocaine 4% cream applied to wound bed prior to debridement (In Clinic Only). Wound #6 Left,Lateral Lower Leg o Topical Lidocaine 4% cream applied to wound bed prior to debridement (In Clinic Only). Wound #9 Left,Lateral,Posterior Lower Leg o Topical Lidocaine 4% cream applied to wound bed prior to debridement (In Clinic Only). Skin Barriers/Peri-Wound Care Wound #10 Left,Proximal,Lateral Lower Leg o Triamcinolone Acetonide Ointment (TCA) - peri-wound Wound #11 Right,Medial Lower Leg o Triamcinolone Acetonide Ointment (TCA) - peri-wound Wound #5 Left,Medial Lower Leg o Triamcinolone Acetonide Ointment (TCA) -  peri-wound Wound #6 Left,Lateral Lower Leg o Triamcinolone Acetonide Ointment (TCA) - peri-wound Wound #9 Left,Lateral,Posterior Lower Leg o Triamcinolone Acetonide Ointment (TCA) - peri-wound Primary Wound Dressing Wound #10 Left,Proximal,Lateral Lower Leg o Alginate Wound #11 Right,Medial Lower Leg o Alginate Wound #5 Left,Medial Lower Leg o Alginate Wound #6 Left,Lateral Lower Leg o Alginate Wound #9 Left,Lateral,Posterior Lower Leg o Alginate Secondary Dressing Wound #10 Left,Proximal,Lateral Lower Leg o ABD pad Wound #11 Right,Medial Lower Leg o ABD pad [name], [name]. ([id number]) Wound #5 Left,Medial Lower Leg o ABD pad Wound #6 Left,Lateral Lower Leg o ABD pad Wound #9 Left,Lateral,Posterior Lower Leg o ABD pad Dressing Change Frequency Wound #10 Left,Proximal,Lateral Lower Leg o Change dressing every week o Other: - Nurse visit Friday and Monday if needed. Wound #11 Right,Medial Lower Leg o Change dressing every week o Other: - Nurse visit Friday and Monday if needed. Wound #5 Left,Medial Lower Leg o Change dressing every week o Other: - Nurse visit Friday and Monday if needed. Wound #6 Left,Lateral Lower Leg o Change dressing every week o Other: - Nurse visit Friday and Monday if needed. Wound #9 Left,Lateral,Posterior Lower Leg o Change dressing every week o Other: - Nurse visit Friday and Monday if needed. Follow-up Appointments Wound #10 Left,Proximal,Lateral Lower Leg o Return Appointment in 1 week. o Nurse Visit as needed Wound #11 Right,Medial Lower Leg o Return Appointment in 1 week. o Nurse Visit as needed Wound #5 Left,Medial Lower Leg o Return Appointment in 1 week. o Nurse Visit as needed Wound #6 Left,Lateral Lower Leg o Return Appointment in 1 week. o Nurse Visit as needed Wound #9 Left,Lateral,Posterior Lower Leg o Return Appointment in 1 week. o Nurse Visit as  needed Edema Control Wound #10 Left,Proximal,Lateral Lower Leg o 4 Layer Compression System - Bilateral Wound #11 Right,Medial Lower Leg o 4 Layer Compression System - Bilateral Wound #5 Left,Medial Lower Leg o 4 Layer Compression System - Bilateral Wound #6 Left,Lateral Lower Leg o 4 Layer Compression System - Bilateral Wound #9 Left,Lateral,Posterior Lower Leg o 4 Layer Compression System - Bilateral [name], [name] Services and Therapies o Lymphedema Clinic - Lymphedema Pumps Electronic Signature(s) Signed: 02/17/2020 4:08:23 PM By: Linton Ham MD Signed: 02/17/2020 4:30:28 PM By: [name], [name], RN, CWS, [name], [name] Entered By: [name], [name],  RN, CWS, Kim on 02/17/2020 13:57:03 [name], [name] -------------------------------------------------------------------------------- Problem List Details Patient Name: SHIRLE, STARKES. Date of Service: 02/17/2020 1:15 PM Medical Record Number: [id number] Patient Account Number: [id number] Date of Birth/Sex: 1946/02/07 (74 y.o. F) Treating RN: Cornell Barman Primary Care Provider: [name] Other Clinician: Referring Provider: [name] Treating Provider/Extender: Tito Dine in Treatment: 72 Active Problems ICD-10 Evaluated Encounter Code Description Active Date Today Diagnosis L97.221 Non-pressure chronic ulcer of left calf limited to breakdown of skin 01/07/2019 No Yes I87.332 Chronic venous hypertension (idiopathic) with ulcer and inflammation of left 12/09/2019 No Yes lower extremity L97.211 Non-pressure chronic ulcer of right calf limited to breakdown of skin 02/10/2020 No Yes I89.0 Lymphedema, not elsewhere classified 12/10/2018 No Yes I82.592 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 Date of Service: 02/17/2020 1:15 PM Medical Record Number: [id number] Patient Account Number: [id number] Date of Birth/Sex: 1946/05/15 (74 y.o. F) Treating RN: Cornell Barman Primary Care Provider: [name] Other Clinician: Referring Provider: [name] Treating Provider/Extender: Tito Dine in Treatment: 74 Subjective History of Present Illness (HPI) Pleasant 74 year old with history of chronic venous insufficiency. No diabetes or peripheral vascular disease. Left ABI 1.29. Questionable history of left lower extremity DVT. She developed a recurrent ulceration on her left lateral calf in December 2015, which she attributes to poor diet and subsequent lower extremity edema. She underwent endovenous laser ablation of her left greater saphenous vein in 2010. She underwent laser ablation of accessory branch of left GSV in April 2016 by Dr. Kellie Simmering at [facility]. She was previously wearing [place] boots, which she tolerated well. Tolerating 2 layer compression and cadexomer iodine. She returns to clinic for follow-up and is without new complaints. She denies any significant pain at this time. She reports persistent pain with pressure. No claudication or ischemic rest pain. No fever or chills. No drainage. READMISSION 11/13/16; this is a 74 year old woman who is not a diabetic. She is here for a review of a painful area on her left medial lower extremity. I note that she was seen here previously last year for wound I believe to be in the same area. At that time she had undergone previously a left greater saphenous  vein ablation by Dr. Kellie Simmering and she had a ablation of the anterior accessory branch of the left greater saphenous vein in March 2016. Seeing that the wound actually closed over. In reviewing the history with her today the ulcer in this area has been recurrent. She describes a biopsy of this area in 2009 that only showed stasis physiology. 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 Do not see that she has ever had arterial studies in the left leg. She tells me she has a follow-up with Dr. Kellie Simmering in roughly 10 days In any case she developed the reopening of this area roughly a month ago. On  the background of this she describes rapidly increasing edema which has responded to Lasix 40 mg and metolazone 2.5 mg as well as the patient's lymph massage. She has been told she has both venous insufficiency and lymphedema but she cannot tolerate compression stockings 11/28/16; the patient saw Dr. Kellie Simmering recently. Per the patient he did arterial Dopplers in the office that did not show evidence of arterial insufficiency, per the patient he stated "treat this like an ordinary venous ulcer". She also saw her dermatologist Dr. Ronnald Ramp who felt that this was more of a vascular ulcer. In general things are improving although she arrives today with increasing bilateral lower extremity edema with weeping a deeper fluid through the wound on the left medial leg compatible with some degree of lymphedema 12/04/16; the patient's wound is fully epithelialized but I don't think fully healed. We will do another week of depression with Promogran and TCA however I suspect we'll be able to discharge her next week. This is a very unusual-looking wound which was initially a figure-of-eight type wound lying on its side  surrounded by petechial like hemorrhage. She has had venous ablation on this side. She apparently does not have an arterial issue per Dr. Kellie Simmering. She saw her dermatologist thought it was "vascular". Patient is definitely going to need ongoing compression and I talked about this with her today she will go to elastic therapy after she leaves here next week 12/11/16; the patient's wound is not completely closed today. She has surrounding scar tissue and in further discussion with the patient it would appear that she had ulcers in this area in 2009 for a prolonged period of time ultimately requiring a punch biopsy of this area that only showed venous insufficiency. I did not previously pickup on this part of the history from the patient. 12/18/16; the patient's wound is completely epithelialized. There is no open area here. She has significant bilateral venous insufficiency with secondary lymphedema to a mild-to-moderate degree she does not have compression stockings.. She did not say anything to me when I was in the room, she told our intake nurse that she was still having pain in this area. This isn't unusual recurrent small open area. She is going to go to elastic therapy to obtain compression stockings. 12/25/16; the patient's wound is fully epithelialized. There is no open area here. The patient describes some continued episodic discomfort in this area medial left calf. However everything looks fine and healed here. She is been to elastic therapy and caught herself 15-20 mmHg stockings, they apparently were having trouble getting 20-30 mm stockings in her size 01/22/17; this is a patient we discharged from the clinic a month ago. She has a recurrent open wound on her medial left calf. She had 15 mm support stockings. I told her I thought she needed 20-30 mm compression stockings. She tells me that she has been ill with hospitalization secondary to asthma and is been found to have severe hypokalemia likely  secondary to a combination of Lasix and metolazone. This morning she noted blistering and leaking fluid on the posterior part of her left leg. She called our intake nurse urgently and we was saw her this afternoon. She has not had any real discomfort here. I don't know that she's been wearing any stockings on this leg for at least 2-3 days. ABIs in this clinic were 1.21 on the right and 1.3 on the left. She is previously seen vascular surgery who does not think that there is a  peripheral arterial issue. 01/30/17; Patient arrives with no open wound on the left leg. She has been to elastic therapy and obtained 20-3mmhg below knee stockings and she has one on the right leg today. READMISSION 02/19/18; this [place] is a now 74 year old patient we've had in this clinic perhaps 3 times before. I had last looked at her from January 07 December 2016 with an area on the medial left leg. We discharged her on 12/25/16 however she had to be readmitted on 01/22/17 with a recurrence. I have in my notes that we discharged her on 20-30 mm stockings although she tells me she was only wearing support hose because she cannot get stockings on predominantly related to her cervical spine surgery/issues. She has had previous ablations done by vein and vascular in [place] including a great saphenous vein ablation on the left with an anterior accessory branch ablation I think both of these were in 2016. On one of the previous visit she had a biopsy noted 2009 that was negative. She is not felt to have an arterial issue. She is not a diabetic. She does have a history of obstructive sleep apnea hypertension asthma as well as chronic venous insufficiency and lymphedema. KARENA, HAMRIC ([id number]) On this occasion she noted 2 dry scaly patch on her left leg. She tried to put lotion on this it didn't really help. There were 2 open areas.the patient has been seeing her primary physician from 02/05/18 through 02/14/18. She had Unna  boots applied. The superior wound now on the lateral left leg has closed but she's had one wound that remains open on the lateral left leg. This is not the same spot as we dealt with in 2018. ABIs in this clinic were 1.3 bilaterally 02/26/18; patient has a small wound on the left lateral calf. Dimensions are down. She has chronic venous insufficiency and lymphedema. 03/05/18; small open area on the left lateral calf. Dimensions are down. Tightly adherent necrotic debris over the surface of the wound which was difficult to remove. Also the dressing [over collagen] stuck to the wound surface. This was removed with some difficulty as well. Change the primary dressing to Hydrofera Blue ready 03/12/18; small open area on the left lateral calf. Comes in with tightly adherent surface eschar as well as some adherent Hydrofera Blue. 03/19/18; open area on the left lateral calf. Again adherent surface eschar as well as some adherent Hydrofera Blue nonviable subcutaneous tissue. She complained of pain all week even with the reduction from 4-3 layer compression I put on last week. Also she had an increase in her ankle and calf measurements probably related to the same thing. 03/26/18; open area on the left lateral calf. A very small open area remains here. We used silver alginate starting last week as the Hydrofera Blue seem to stick to the wound bed. In using 4-layer compression 04/02/18; the open area in the left lateral calf at some adherent slough which I removed there is no open area here. We are able to transition her into her own compression stocking. Truthfully I think this is probably his support hose. However this does not maintain skin integrity will be limited. She cannot put over the toe compression stockings on because of neck problems hand problems etc. She is allergic to the lining layer of juxta lites. We might be forced to use extremitease stocking should this fail READMIT 11/24/2018 Patient is now a  74 year old woman who is not a diabetic. She has been in this  clinic on at least 3 previous occasions largely with recurrent wounds on her left leg secondary to chronic venous insufficiency with secondary lymphedema. Her situation is complicated by inability to get stockings on and an allergy to neoprene which is apparently a component and at least juxta lites and other stockings. As a result she really has not been wearing any stockings on her legs. She tells [place] that roughly 2 or 3 weeks ago she started noticing a stinging sensation just above her ankle on the left medial aspect. She has been diagnosed with pseudogout and she wondered whether this was what she was experiencing. She tried to dress this with something she bought at the store however subsequently it pulled skin off and now she has an open wound that is not improving. She has been using Vaseline gauze with a cover bandage. She saw her primary doctor last week who put an Haematologist on her. ABIs in this clinic was 1.03 on the left 2/12; the area is on the left medial ankle. Odd-looking wound with what looks to be surface epithelialization but a multitude of small petechial openings. This clearly not closed yet. We have been using silver alginate under 3 layer compression with TCA 2/19; the wound area did not look quite as good this week. Necrotic debris over the majority of the wound surface which required debridement. She continues to have a multitude of what looked to be small petechial openings. She reminds [place] that she had a biopsy on this initially during her first outbreak in 2015 in [place] dermatology. She expresses concern about this being a possible melanoma. 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 I assured her that this does not look anything like melanoma. Except for the petechial reaction it does look like a venous insufficiency area  and she certainly has evidence of this on both sides 2/26; a difficult area on the left medial ankle. The patient clearly has chronic venous hypertension with some degree of lymphedema. The odd thing about the area is the small petechial hemorrhages. I am not really sure how to explain this. This was present last time and this is not a compression injury. We have been using Hydrofera Blue which I changed to last week 3/4; still using Hydrofera Blue. Aggressive debridement today. She does not have known arterial issues. She has seen Dr. Kellie Simmering at [facility] vein and vascular and and has an ablation on the left. [Anterior accessory branch of the greater saphenous]. From what I remember they did not feel she had an arterial issue. The patient has had this area biopsied in 2009 at [facility] dermatology and by her recollection they said this was "stasis". She is also follow-up with dermatology locally who thought that this was more of a vascular issue 3/11; using Hydrofera Blue. Aggressive debridement today. She does not have an arterial issue. We are using 3 layer compression although we may need to go to 4. The patient has been in for multiple changes to her wrap since I last saw her a week ago. She says that the area was leaking. I do not have too much more information on what was found 01/19/19 on evaluation today patient was actually being seen for a nurse visit when unfortunately she had the area on her left lateral lower extremity as well as weeping from the right lower extremity that became apparent. Therefore we did end up actually seeing her  for a full visit with myself. She is having some pain at this site as well but fortunately nothing too significant at this point. No fevers, chills, nausea, or vomiting noted at this time. 3/18-Patient is back to the clinic with the left leg venous leg ulcer, the ulcer is larger in size, has a surface that is densely adherent with fibrinous tissue, the  Hydrofera Blue was used but is densely adherent and there was difficulty in removing it. The right lower extremity was also wrapped for weeping edema. Patient has a new area over the left lateral foot above the malleolus that is small and appears to have no debris with intact surrounding skin. Patient is on increased dose of Lasix also as a means to edema management 3/25; the patient has a nonhealing venous ulcer on the medial left leg and last week developed a smaller area on the lateral left calf. We have been using Hydrofera Blue with a contact layer. 4/1; no major change in these wounds areas. Left medial and more recently left lateral calf. I tried Iodoflex last week to aid in debridement she did not tolerate this. She stated her pain was terrible all week. She took the top layer of the 4 layer compression off. 4/8; the patient actually looks somewhat better in terms of her more prominent left lateral calf wound. There is some healthy looking tissue here. She is still complaining of a lot of discomfort. 4/15; patient in a lot of pain secondary to sciatica. She is on a prednisone taper prescribed by her primary physician. She has the 2 areas one on the left medial and more recently a smaller area on the left lateral calf. Both of these just above the malleoli 4/22; her back pain is better but she still states she is very uncomfortable and now feels she is intolerant to the [employer]. No real change in the wounds we have been using Sorbact. She has been previously intolerant to Iodoflex. There is not a lot of option about what we can use to debride this wound under compression that she no doubt needs. sHe states Ultram no longer works for her pain MADELEINE, LAHNER ([id number]) 4/29; no major change in the wounds slightly increased depth. Surface on the original medial wound perhaps somewhat improved however the more recent area on the lateral left ankle is 100% covered in very adherent debris we  have been using Sorbact. She tolerates 4 layer compression well and her edema control is a lot better. She has not had to come in for a nurse check 5/6; no major change in the condition of the wounds. She did consent to debridement today which was done with some difficulty. Continuing Sorbact. She did not tolerate Iodoflex. She was in for a check of her compression the day after we wrapped her last week this was adjusted but nothing much was found 5/13; no major change in the condition or area of the wounds. I was able to get a fairly aggressive debridement done on the lateral left leg wound. Even using Sorbact under compression. She came back in on Friday to have the wrap changed. She says she felt uncomfortable on the lateral aspect of her ankle. She has a long history of chronic venous insufficiency including previous ablation surgery on this side. 5/20-Patient returns for wounds on left leg with both wounds covered in slough, with the lateral leg wound larger in size, she has been in 3 layer compression and felt more comfortable, she  describes pain in ankle, in leg and pins and needles in foot, and is about to try Pamelor for this 6/3; wounds on the left lateral and left medial leg. The area medially which is the most recent of the 2 seems to have had the largest increase in dimensions. We have been using Sorbac to try and debride the surface. She has been to see orthopedics they apparently did a plain x-ray that was indeterminant. Diagnosed her with neuropathy and they have ordered an MRI to determine if there is underlying osteomyelitis. This was not high on my thought list but I suppose it is prudent. We have advised her to make an appointment with vein and vascular in [place]. She has a history of a left greater saphenous and accessory vein ablations I wonder if there is anything else that can be done from a surgical point of view to help in these difficult refractory wounds. We have  previously healed this wound on one occasion but it keeps on reopening [medial side] 6/10; deep tissue culture I did last week I think on the left medial wound showed both moderate E. coli and moderate staph aureus [MSSA]. She is going to require antibiotics and I have chosen Augmentin. We have been using Sorbact and we have made better looking wound surface on both sides but certainly no improvement in wound area. She was back in last Friday apparently for a dressing changes the wrap was hurting her outer left ankle. She has not managed to get a hold of vein and vascular in [place]. We are going to have to make her that appointment 6/17; patient is tolerating the Augmentin. She had an MRI that I think was ordered by orthopedic surgeon this did not show osteomyelitis or an abscess did suggest cellulitis. We have been using Sorbact to the lateral and medial ankles. We have been trying to arrange a follow-up appointment with vein and vascular in [place] or did her original ablations. We apparently an area sent the request to vein and vascular in [facility] 6/24; patient has completed the Augmentin. We do not yet have a vein and vascular appointment in [place]. I am not sure what the issue is here we have asked her to call tomorrow. We are using Sorbact. Making some improvements and especially the medial wound. Both surfaces however look better medial and lateral. 7/1; the patient has been in contact with vein and vascular in [place] but has not yet received an appointment. Using Sorbact we have gradually improve the wound surface with no improvement in surface area. She is approved for Apligraf but the wound surface still is not completely viable. She has not had to come in for a dressing change 7/8; the patient has an appointment with vein and vascular on 7/31 which is a Friday afternoon. She is concerned about getting back here for [place] to dress her wounds. I think it is important to have  them goal for her venous reflux/history of ablations etc. to see if anything else can be done. She apparently tested positive for 1 of the blood tests with regards to lupus and saw a rheumatologist. He has raised the issue of vasculitis again. I have had this thought in the past however the evidence seems overwhelming that this is a venous reflux etiology. If the rheumatologist tells me there is clinical and laboratory investigation is positive for lupus I will rethink this. 7/15; the patient's wound surfaces are quite a bit better. The medial area which was her  original wound now has no depth although the lateral wound which was the more recent area actually appears larger. Both with viable surfaces which is indeed better. Using Sorbact. I wanted to use Apligraf on her however there is the issue of the vein and vascular appointment on 7/31 at 2:00 in the afternoon which would not allow her to get back to be rewrapped and they would no doubt remove the graft 7/22; the patient's wound surfaces have moderate amount of debris although generally look better. The lateral one is larger with 2 small satellite areas superiorly. We are waiting for her vein and vascular appointment on 7/31. She has been approved for Apligraf which I would like to use after th 7/29; wound surfaces have improved no debridement is required we have been using Sorbact. She sees vein and vascular on Friday with this so question of whether anything can be done to lessen the likelihood of recurrence and/or speed the healing of these areas. She is already had previous ablations. She no doubt has severe venous hypertension 8/5-Patient returns at 1 week, she was in [place] for 3 days by her podiatrist, we have been using so backed to the wound, she has increased pain in both the wounds on the left lower leg especially the more distal one on the lateral aspect 8/12-Patient returns at 1 week and she is agreeable to having debridement in  both wounds on her left leg today. We have been using Sorbact, and vascular studies were reviewed at last visit 8/19; the patient arrives with her wounds fairly clean and no debridement is required. We have used Sorbact which is really done a nice job in cleaning up these very difficult wound surfaces. The patient saw Dr. Donzetta Matters of vascular surgery on 7/31. He did not feel that there was an arterial component. He felt that her treated greater saphenous vein is adequately addressed and that the small saphenous vein did not appear to be involved significantly. She was also noted to have deep venous reflux which is not treatable. Dr. Donzetta Matters mentioned the possibility of a central obstructive component leading to reflux and he offered her central venography. She wanted to discuss this or think about it. I have urged her to go ahead with this. She has had recurrent difficult wounds in these areas which do heal but after months in the clinic. If there is anything that can be done to reduce the likelihood of this I think it is worth it. 9/2 she is still working towards getting follow-up with Dr. Donzetta Matters to schedule her CT. Things are quite a bit worse venography. I put Apligraf on 2 weeks ago on both wounds on the medial and lateral part of her left lower leg. She arrives in clinic today with 3 superficial additional wounds above the area laterally and one below the wound medially. She describes a lot of discomfort. I think these are probably wrapped injuries. Does not look like she has cellulitis. 07/20/2019 on evaluation today patient appears to be doing somewhat poorly in regard to her lower extremity ulcers. She in fact showed signs of erythema in fact we may even be dealing with an infection at this time. Unfortunately I am unsure if this is just infection or if indeed there may be some allergic reaction that occurred as a result of the Apligraf application. With that being said that would be unusual but  nonetheless not impossible in this patient is one who is unfortunately allergic to quite a bit.  Currently we have been using the Sorbact which seems to do as well as anything for her. I do think we may want to obtain a culture today to see if there is anything showing up there that may need to be addressed. 9/16; noted that last week the wounds look worse in 1 week follow-up of the Apligraf. Using Sorbact as of 2 days ago. She arrives with copious amounts of drainage and new skin breakdown on the back of the left calf. The wounds arm more substantial bilaterally. There is a fair amount of swelling in the left calf no overt DVT there is edema present I think in the left greater than right thigh. She is supposed to go on 9/28 for CT NAVAEH, LINCH. ([id number]) venography. The wounds on the medial and lateral calf are worse and she has new skin breakdown posteriorly at least new for me. This is almost developing into a circumferential wound area The Apligraf was taken off last week which I agree with things are not going in the right direction a culture was done we do not have that back yet. She is on Augmentin that she started 2 days ago 9/23; dressing was changed by her nurses on Monday. In general there is no improvement in the wound areas although the area looks less angry than last week. She did get Augmentin for MSSA cultured on the 14th. She still appears to have too much swelling in the left leg even with 3 layer compression 9/30; the patient underwent her procedure on 9/28 by Dr. Donzetta Matters at vascular and vein specialist. She was discovered to have the common iliac vein measuring 12.2 mm but at the level of L4-L5 measured 3 mm. After stenting it measured 10 mm. 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 Rouleaux flow in the common femoral and femoral vein was observed much improved after stenting. We are using silver alginate to the wounds on the medial and lateral ankle on the  left. 4 layer compression 10/7; the patient had fluid swelling around her knee and 4 layer compression. At the advice of vein and vascular this was reduced to 3 layer which she is tolerating better. We have been using silver alginate under 3 layer compression since last Friday 10/14; arrives with the areas on the left ankle looking a lot better. Inflammation in the area also a lot better. She came in for a nurse check on 10/9 10/21; continued nice improvement. Slight improvements in surface area of both the medial and lateral wounds on the left. A lot of the satellite lesions in the weeping erythema around these from stasis dermatitis is resolved. We have been using silver alginate 10/28; general improvement in the entire wound areas although not a lot of change in dimensions the wound certainly looks better. There is a lot less in terms of venous inflammation. Continue silver alginate this week however look towards Hydrofera Blue next week 11/4; very adherent debris on the medial wound left wound is not as bad. We have been using silver alginate. Change to [facility] today 11/11; very adherent debris on both wound areas. She went to vein and vascular last week and follow-up they put in [place] boot on this today. He says the [facility] was adherent. Wound is definitely not as good as last week. Especially on the left there the satellite lesions look more prominent 11/18; absolutely no better. erythema on lateral aspect with tenderness. 09/30/2019 on evaluation today patient appears to  actually be doing better. Dr. Dellia Nims did put her on doxycycline last week which I do believe has helped her at this point. Fortunately there is no signs of active infection at this time. No fevers, chills, nausea, vomiting, or diarrhea. I do believe he may want extend the doxycycline for 7 additional days just to ensure everything does completely cleared up the patient is in agreement with that plan. Otherwise she  is going require some sharp debridement today 12/2; patient is completing a 2-week course of doxycycline. I gave her this empirically for inflammation as well as infection when I last saw her 2 weeks ago. All of this seems to be better. She is using silver alginate she has the area on the medial aspect of the larger area laterally and the 2 small satellite regions laterally above the major wound. 12/9; the patient's wound on the left medial and left lateral calf look really quite good. We have been using silver alginate. She saw vein and vascular in follow-up on 10/09/2019. She has had a previous left greater saphenous vein ablation by Dr. Oscar La in 2016. More recently she underwent a left common iliac vein stent by Dr. 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 The swelling is improved and certainly the wounds have improved. The patient shows [place] today area on the right medial calf there is almost no wound but leaking lymphedema. She says she start this started 3 or 4 days ago. She did not traumatize it. It is not painful. She does not wear compression on that side 12/16; the patient continues to do well laterally. Medially still requiring debridement. The area on the right calf did not materialize to anything and is not currently open. We wrapped this last time. She has support stockings for that leg although I am not sure they are going to provide adequate compression 12/23; the lateral wound looks stable. Medially still requiring debridement for tightly adherent fibrinous debris. We've been using silver alginate. Surface area not any different 12/30; neither wound is any better with regards to surface and the area on the left lateral is larger. I been using silver alginate to the left lateral which look quite good last week and Sorbact to the left medial 11/11/2019. Lateral wound area actually looks better and somewhat smaller. Medial still requires a very aggressive debridement today. We  have been using Sorbact on both wound areas 1/13; not much better still adherent debris bilaterally. I been using Sorbact. She has severe venous hypertension. Probably some degree of dermal fibrosis distally. I wonder whether tighter compression might help and I am going to try that today. We also need to work on the bioburden 1/20; using Sorbact. She has severe venous hypertension status post stent placement for pelvic vein compression. We applied gentamicin last time to see if we could reduce bioburden I had some discussion with her today about the use of pentoxifylline. This is occasionally used in this setting for wounds with refractory venous insufficiency. However this interacts with Plavix. She tells me that she was put on this after stent placement for 3 months. She will call Dr. Claretha Cooper office to discuss 1/27; we are using gentamicin under Sorbact. 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 She has a stent. Wound medially is measuring smaller this week. Laterally measuring slightly larger although she has some satellite lesions superiorly 2/3; gentamicin under Sorbact under 4-layer compression. 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 She has a stent on  Plavix. Her wounds are measuring smaller this week. More substantially laterally where there is a satellite lesion superiorly. 2/10; gentamicin under Sorbac. 4-layer compression. Patient communicated with Dr. Donzetta Matters at [facility] and vascular in [place]. He is okay with the patient coming off Plavix I will therefore start her on pentoxifylline for a 1 month trial. In general her wounds look better today. I had some concerns about swelling in the left thigh however she measures 61.5 on the right and 63 on the mid thigh which does not suggest there is any difficulty. The patient is not describing any pain. 2/17; gentamicin under Sorbac 4-layer compression. She has been on pentoxifylline for 1  week and complains of loose stool. No nausea she is eating and drinking well 2/24; the patient apparently came in 2 days ago for a nurse visit when her wrap fell down. Both areas look a little worse this week macerated medially and satellite lesions laterally. Change to silver alginate today 3/3; wounds are larger today especially medially. She also has more swelling in her foot lower leg and I even noted some swelling in her posterior thigh which is tender. I wonder about the patency of her stent. Fortuitously she sees Dr. Claretha Cooper group on Friday 3/10; Mrs. Cutaia was seen by vein and vascular on 3/5. The patient underwent ultrasound. There was no evidence of thrombosis involving the IVC no evidence of thrombosis involving the right common iliac vein there is no evidence of thrombosis involving the right external iliac vein the left external Hersch, Quinette J. ([id number]) vein is also patent. The right common iliac vein stent appears patent bilateral common femoral veins are compressible and appear patent. I was concerned about the left common iliac stent however it looks like this is functional. She has some edema in the posterior thigh that was tender she still has that this week. I also note they had trouble finding the pulses in her left foot and booked her for an ABI baseline in 4 weeks. She will follow up in 6 months for repeat IVC duplex. The patient stopped the pentoxifylline because of diarrhea. It does not look like that was being effective in any case. I have advised her to go back on her aspirin 81 mg tablet, vascular it also suggested this 3/17; comes in today with her wound surfaces a lot better. The excoriations from last week considerably better probably secondary to the TCA. We have been using silver alginate 3/24; comes in today with smaller wounds both medially and laterally. Both required debridement. There are 2 small satellite areas superiorly laterally. She also has a very odd  bandlike area in the mid calf almost looking like there was a weakness in the wrap in a localized area. I would write this off as being this however anteriorly she has a small raised ballotable area that is very tender almost reminiscent of an abscess but there was no obvious purulent surface to it. 02/04/20 upon evaluation today patient appears to be doing fairly well in regard to her wounds today. Fortunately there is no signs of active infection at this time. No fevers, chills, nausea, vomiting, or diarrhea. She has been tolerating the dressing changes without complication. Fortunately I feel like she is showing signs of improvement although has been sometime since have seen her. Nonetheless the area of concern that Dr. Dellia Nims had last week where she had possibly an area of the wrap that was we can allow the leg to bulge appears to be doing significantly  better today there is no signs of anything worsening. 4/7; the patient's wounds on her medial and lateral left leg continue to contract. We have been using a regular alginate. Last week she developed an area on the right medial lower leg which is probably a venous ulcer as well. 4/14; the wounds on her left medial and lateral lower leg continue to contract. Surface eschar. We have been using regular alginate. The area on the right medial lower leg is closed. We have been putting both legs under 4-layer contraction. The patient went back to see vein and vascular she had arterial studies done which were apparently "quite good" per the patient although I have not read their notes I have never felt she had an arterial issue. 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 This is been longstanding and refractory to exercise, leg elevation and longstanding use of compression wraps in our clinic as well as compression stockings on the times we have been able to get these to heal Objective Constitutional Vitals  Time Taken: 1:20 PM, Height: 63 in, Weight: 224.7 lbs, BMI: 39.8, Temperature: 98.5 F, Pulse: 82 bpm, Respiratory Rate: 18 breaths/min, Blood Pressure: 137/68 mmHg. Cardiovascular Pedal pulses pedal pulses are palpable bilaterally. Patient's edema is fairly well controlled. General Notes: Wound exam; both wounds are on the left medial and left lateral are very small. Surface eschar and debris removed with a #5 curette the wounds look healthy very small and may be closed by next week her edema control is quite reasonable. On the right medial there was a wound that we defined 2 weeks ago I think this is mostly closed I did not debride this area. Integumentary (Hair, Skin) Wound #10 status is Healed - Epithelialized. Original cause of wound was Blister. The wound is located on the Left,Proximal,Lateral Lower Leg. The wound measures 0cm length x 0cm width x 0cm depth; 0cm^2 area and 0cm^3 volume. There is Fat Layer (Subcutaneous Tissue) Exposed exposed. There is no tunneling or undermining noted. There is a medium amount of serosanguineous drainage noted. There is small (1-33%) red granulation within the wound bed. There is a large (67-100%) amount of necrotic tissue within the wound bed including Eschar and Adherent Slough. Wound #11 status is Healed - Epithelialized. Original cause of wound was Gradually Appeared. The wound is located on the Right,Medial Lower Leg. The wound measures 0cm length x 0cm width x 0cm depth; 0cm^2 area and 0cm^3 volume. There is Fat Layer (Subcutaneous Tissue) Exposed exposed. There is no tunneling or undermining noted. There is a medium amount of serosanguineous drainage noted. There is small (1-33%) red granulation within the wound bed. There is a large (67-100%) amount of necrotic tissue within the wound bed including Eschar and Adherent Slough. Wound #5 status is Open. Original cause of wound was Gradually Appeared. The wound is located on the Left,Medial Lower Leg.  The wound measures 0.3cm length x 0.4cm width x 0.1cm depth; 0.094cm^2 area and 0.009cm^3 volume. There is Fat Layer (Subcutaneous Tissue) Exposed exposed. There is no tunneling or undermining noted. There is a medium amount of serosanguineous drainage noted. There is small (1-33%) red Paletta, Azile J. ([id number]) granulation within the wound bed. There is a large (67-100%) amount of necrotic tissue within the wound bed including Eschar and Adherent Slough. Wound #6 status is Open. Original cause of wound was Gradually Appeared. The wound is located on the Left,Lateral Lower Leg. The wound measures 0.7cm length x 0.6cm width x 0.1cm  depth; 0.33cm^2 area and 0.033cm^3 volume. There is Fat Layer (Subcutaneous Tissue) Exposed exposed. There is no tunneling or undermining noted. There is a medium amount of serosanguineous drainage noted. There is small (1-33%) red granulation within the wound bed. There is a large (67-100%) amount of necrotic tissue within the wound bed including Eschar and Adherent Slough. Wound #9 status is Healed - Epithelialized. Original cause of wound was Gradually Appeared. The wound is located on the Left,Lateral,Posterior Lower Leg. The wound measures 0cm length x 0cm width x 0cm depth; 0cm^2 area and 0cm^3 volume. There is Fat Layer (Subcutaneous Tissue) Exposed exposed. There is no tunneling or undermining noted. There is a medium amount of serosanguineous drainage noted. There is small (1-33%) pink granulation within the wound bed. There is a large (67-100%) amount of necrotic tissue within the wound bed including Eschar and Adherent Slough. 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 There was a Selective/Open Wound Non-Viable Tissue Debridement with a total area of 0.12 sq cm performed by [name] Dillon, MD. With the following instrument(s): Curette to remove Non-Viable tissue/material. Material removed includes Eschar. No specimens were taken. A time out was conducted at 13:52, prior to the start of the procedure. A Moderate amount of bleeding was controlled with Pressure. The procedure was tolerated well. Post Debridement Measurements: 0.3cm length x 0.4cm width x 0.1cm depth; 0.009cm^3 volume. Character of Wound/Ulcer Post Debridement is stable. Post procedure Diagnosis Wound #5: Same as Pre-Procedure Wound #6 Pre-procedure diagnosis of Wound #6 is a Venous Leg Ulcer located on the Left,Lateral Lower Leg .Severity of Tissue Pre Debridement is: Fat layer exposed. There was a Selective/Open Wound Non-Viable Tissue Debridement with a total area of 0.42 sq cm performed by [name] Dillon, MD. With the following instrument(s): Curette to remove Non-Viable tissue/material. Material removed includes Eschar after achieving pain control using Lidocaine. No specimens were taken. A time out was conducted at 13:52, prior to the start of the procedure. A Moderate amount of bleeding was controlled with Pressure. The procedure was tolerated well. Post Debridement Measurements: 0.7cm length x 0.6cm width x 0.1cm depth; 0.033cm^3 volume. Character of Wound/Ulcer Post Debridement is stable. Severity of Tissue Post Debridement is: Fat layer exposed. Post procedure Diagnosis Wound #6: Same as Pre-Procedure Plan Anesthetic (add to Medication List): Wound #10 Left,Proximal,Lateral Lower Leg: Topical Lidocaine 4% cream applied to wound bed prior to debridement (In Clinic Only). Wound #11 Right,Medial Lower Leg: Topical  Lidocaine 4% cream applied to wound bed prior to debridement (In Clinic Only). Wound #5 Left,Medial Lower Leg: Topical Lidocaine 4% cream applied to wound bed prior to debridement (In Clinic Only). Wound #6 Left,Lateral Lower Leg: SACHEEN, KRUMWIEDE ([id number]) Topical Lidocaine 4% cream applied to wound bed prior to debridement (In Clinic Only). Wound #9 Left,Lateral,Posterior Lower Leg: Topical Lidocaine 4% cream applied to wound bed prior to debridement (In Clinic Only). Skin Barriers/Peri-Wound Care: Wound #10 Left,Proximal,Lateral Lower Leg: Triamcinolone Acetonide Ointment (TCA) - peri-wound Wound #11 Right,Medial Lower Leg: Triamcinolone Acetonide Ointment (TCA) - peri-wound Wound #5 Left,Medial Lower Leg: Triamcinolone Acetonide Ointment (TCA) - peri-wound Wound #6 Left,Lateral  Lower Leg: Triamcinolone Acetonide Ointment (TCA) - peri-wound Wound #9 Left,Lateral,Posterior Lower Leg: Triamcinolone Acetonide Ointment (TCA) - peri-wound Primary Wound Dressing: Wound #10 Left,Proximal,Lateral Lower Leg: Alginate Wound #11 Right,Medial Lower Leg: Alginate Wound #5 Left,Medial Lower Leg: Alginate Wound #6 Left,Lateral Lower Leg: Alginate Wound #9 Left,Lateral,Posterior Lower Leg: Alginate Secondary Dressing: Wound #10 Left,Proximal,Lateral Lower Leg: ABD pad Wound #11 Right,Medial Lower Leg: ABD pad Wound #5 Left,Medial Lower Leg: ABD pad Wound #6 Left,Lateral Lower Leg: ABD pad Wound #9 Left,Lateral,Posterior Lower Leg: ABD pad Dressing Change Frequency: Wound #10 Left,Proximal,Lateral Lower Leg: Change dressing every week Other: - Nurse visit Friday and Monday if needed. Wound #11 Right,Medial Lower Leg: Change dressing every week Other: - Nurse visit Friday and Monday if needed. Wound #5 Left,Medial Lower Leg: Change dressing every week Other: - Nurse visit Friday and Monday if needed. Wound #6 Left,Lateral Lower Leg: Change dressing every week Other: - Nurse  visit Friday and Monday if needed. Wound #9 Left,Lateral,Posterior Lower Leg: Change dressing every week Other: - Nurse visit Friday and Monday if needed. Follow-up Appointments: Wound #10 Left,Proximal,Lateral Lower Leg: Return Appointment in 1 week. Nurse Visit as needed Wound #11 Right,Medial Lower Leg: Return Appointment in 1 week. Nurse Visit as needed Wound #5 Left,Medial Lower Leg: Return Appointment in 1 week. Nurse Visit as needed Wound #6 Left,Lateral Lower Leg: Return Appointment in 1 week. Nurse Visit as needed Wound #9 Left,Lateral,Posterior Lower Leg: Return Appointment in 1 week. Nurse Visit as needed Edema Control: Wound #10 Left,Proximal,Lateral Lower Leg: 4 Layer Compression System - Bilateral Wound #11 Right,Medial Lower Leg: GELA, ROOTH. ([id number]) 4 Layer Compression System - Bilateral Wound #5 Left,Medial Lower Leg: 4 Layer Compression System - Bilateral Wound #6 Left,Lateral Lower Leg: 4 Layer Compression System - Bilateral Wound #9 Left,Lateral,Posterior Lower Leg: 4 Layer Compression System - Bilateral Services and Therapies ordered were: Lymphedema Clinic - Lymphedema Pumps 1. We continued with alginate under 4-layer compression bilaterally 2. The patient's area has may be healed by next week 3. We have had this patient heal before who has severe chronic venous insufficiency with secondary lymphedema. We have not been able to keep her in a healed state in spite of both external compression garments as well as standard over the toe stockings. In fact even our compression wraps in clinic barely control her edema. We want to consider her for external compression pumps at home. Electronic Signature(s) Signed: 02/17/2020 4:08:23 PM By: Linton Ham MD Entered By: Linton Ham on 02/17/2020 14:09:11 Schweppe, [name] -------------------------------------------------------------------------------- SuperBill Details Patient Name:  GANELLE, BERENDS. Date of Service: 02/17/2020 Medical Record Number: [id number] Patient Account Number: [id number] Date of Birth/Sex: [birth year] (74 y.o. F) Treating RN: Cornell Barman Primary Care Provider: [name] Other Clinician: Referring Provider: [name] Treating Provider/Extender: Tito Dine in Treatment: 62 Diagnosis Coding ICD-10 Codes Code Description [phone] Non-pressure chronic ulcer of left calf limited to breakdown of skin I87.332 Chronic venous hypertension (idiopathic) with ulcer and inflammation of left lower extremity L97.211 Non-pressure chronic ulcer of right calf limited to breakdown of skin I89.0 Lymphedema, not elsewhere classified I82.592 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 Facility Procedures CPT4 Code: [id number] Description: [id number] - DEBRIDE WOUND 1ST 20 SQ CM OR < Modifier: Quantity: 1 CPT4 Code: Description: ICD-10 Diagnosis Description L97.221 Non-pressure chronic ulcer of left calf limited to breakdown of skin Modifier: Quantity: Physician Procedures CPT4 Code: [id number] Description: [id number] - WC PHYS DEBR WO ANESTH  20 SQ CM Modifier: Quantity: 1 CPT4 Code: Description: ICD-10 Diagnosis Description L97.221 Non-pressure chronic ulcer of left calf limited to breakdown of skin Modifier: Quantity: Electronic Signature(s) Signed: 02/17/2020 4:08:23 PM By: Linton Ham MD Entered By: Linton Ham on 02/17/2020 14:09:35

## 2020-02-18 NOTE — Progress Notes (Signed)
ARINE, LUKS ([id number]) Visit Report for 02/17/2020 Arrival Information Details Patient Name: [name], [name]. Date of Service: 02/17/2020 1:15 PM Medical Record Number: [id number] Patient Account Number: [id number] Date of Birth/Sex: 1946/06/15 (74 y.o. F) Treating RN: Cornell Barman Primary Care Jaxxon Naeem: [name] Other Clinician: Referring Marshayla Mitschke: [name] Treating Arvis Miguez/Extender: Tito Dine in Treatment: 10 Visit Information History Since Last Visit Added or deleted any medications: No Patient Arrived: Ambulatory Any new allergies or adverse reactions: No Arrival Time: 13:21 Had a fall or experienced change in No Accompanied By: self activities of daily living that may affect Transfer Assistance: None risk of falls: Patient Identification Verified: Yes Signs or symptoms of abuse/neglect since last visito No Patient Requires Transmission-Based No Hospitalized since last visit: No Precautions: Implantable device outside of the clinic excluding No Patient Has Alerts: Yes cellular tissue based products placed in the center Patient Alerts: Patient on Blood since last visit: Thinner Has Dressing in Place as Prescribed: Yes aspirin 81 Has Compression in Place as Prescribed: Yes Pain Present Now: No Electronic Signature(s) Signed: 02/17/2020 3:16:46 PM By: Lorine Bears RCP, RRT, CHT Entered By: Lorine Bears on 02/17/2020 13:22:13 Trulock, [name] -------------------------------------------------------------------------------- Encounter Discharge Information Details Patient Name: [name], [name]. Date of Service: 02/17/2020 1:15 PM Medical Record Number: [id number] Patient Account Number: [id number] Date of Birth/Sex: 1945-11-18 (74 y.o. F) Treating RN: Cornell Barman Primary Care Osei Anger: [name] Other Clinician: Referring Kamali Nephew: [name] Treating Araceli Arango/Extender: Tito Dine in Treatment: 24 Encounter Discharge Information Items Post Procedure Vitals Discharge Condition: Stable Temperature (F): 98.5 Ambulatory Status: Ambulatory Pulse (bpm): 82 Discharge Destination: Home Respiratory Rate (breaths/min): 18 Transportation: Private Auto Blood Pressure (mmHg): 137/68 Accompanied By: self Schedule Follow-up Appointment: Yes Clinical Summary of Care: Electronic Signature(s) Signed: 02/17/2020 4:30:28 PM By: [name], [name], RN, CWS, [name], [name] Entered By: [name], [name], RN, CWS, Kim on 02/17/2020 13:58:58 Kneisley, [name] -------------------------------------------------------------------------------- Lower Extremity Assessment Details Patient Name: [name], [name]. Date of Service: 02/17/2020 1:15 PM Medical Record Number: [id number] Patient Account Number: [id number] Date of Birth/Sex: 08-Oct-1946 (74 y.o. F) Treating RN: Army Melia Primary Care Jeyson Deshotel: [name] Other Clinician: Referring Rolly Magri: [name] Treating Secundino Ellithorpe/Extender: Tito Dine in Treatment: 62 Edema Assessment Assessed: [Left: No] [Right: No] Edema: [Left: Yes] [Right: Yes] Calf Left: Right: Point of Measurement: 33 cm From Medial Instep 48 cm 41 cm Ankle Left: Right: Point of Measurement: 10 cm From Medial Instep 22 cm 22 cm Vascular Assessment Pulses: Dorsalis Pedis Palpable: [Left:Yes] [Right:Yes] Electronic Signature(s) Signed: 02/18/2020 9:44:09 AM By: Army Melia Entered By: Army Melia on 02/17/2020 13:39:00 Spieker, [name] -------------------------------------------------------------------------------- Multi Wound Chart Details Patient Name: [name], [name]. Date of Service: 02/17/2020 1:15 PM Medical Record Number: [id number] Patient Account Number: [id number] Date of Birth/Sex: 11-20-1945 (74 y.o. F) Treating RN: Cornell Barman Primary Care Markail Diekman: [name] Other Clinician: Referring Jhordan Kinter:  [name] Treating Scarlett Portlock/Extender: Tito Dine in Treatment: 19 Vital Signs Height(in): 59 Pulse(bpm): 87 Weight(lbs): 224.7 Blood Pressure(mmHg): 137/68 Body Mass Index(BMI): 40 Temperature(F): 98.5 Respiratory Rate(breaths/min): 18 Photos: Wound Location: Left, Proximal, Lateral Lower Leg Right, Medial Lower Leg Left, Medial Lower Leg Wounding Event: Blister Gradually Appeared Gradually Appeared Primary Etiology: Venous Leg Ulcer Venous Leg Ulcer Lymphedema Comorbid History: Cataracts, Asthma, Sleep Apnea, Cataracts, Asthma, Sleep Apnea, Cataracts, Asthma, Sleep Apnea, Deep Vein Thrombosis, Deep Vein Thrombosis, Deep Vein Thrombosis, Hypertension, Peripheral Venous Hypertension, Peripheral Venous Hypertension, Peripheral Venous  Disease, Osteoarthritis, Received Disease, Osteoarthritis, Received Disease, Osteoarthritis, Received Chemotherapy, Received Radiation Chemotherapy, Received Radiation Chemotherapy, Received Radiation Date Acquired: 01/27/2020 02/04/2020 11/19/2018 Weeks of Treatment: 3 1 62 Wound Status: Healed - Epithelialized Healed - Epithelialized Open Measurements L x W x D (cm) 0x0x0 0x0x0 0.3x0.4x0.1 Area (cm) : 0 0 0.094 Volume (cm) : 0 0 0.009 % Reduction in Area: 99.30% 99.00% 98.90% % Reduction in Volume: 99.10% 98.70% 98.90% Classification: Full Thickness Without Exposed Full Thickness Without Exposed Full Thickness Without Exposed Support Structures Support Structures Support Structures Exudate Amount: Medium Medium Medium Exudate Type: Serosanguineous Serosanguineous Serosanguineous Exudate Color: red, brown red, brown red, brown Granulation Amount: Small (1-33%) Small (1-33%) Small (1-33%) Granulation Quality: Red Red Red Necrotic Amount: Large (67-100%) Large (67-100%) Large (67-100%) Necrotic Tissue: Eschar, Adherent Slough Eschar, Adherent Tyro, Adherent Slough Exposed Structures: Fat Layer (Subcutaneous Tissue) Fat  Layer (Subcutaneous Tissue) Fat Layer (Subcutaneous Tissue) Exposed: Yes Exposed: Yes Exposed: Yes Fascia: No Fascia: No Fascia: No Tendon: No Tendon: No Tendon: No Muscle: No Muscle: No Muscle: No Joint: No Joint: No Joint: No Bone: No Bone: No Bone: No Epithelialization: None None None Debridement: N/A N/A Debridement - Selective/Open Wound Pre-procedure Verification/Time N/A N/A 13:52 Out Taken: Pain Control: N/A N/A N/A MIAROSE, POELLNITZ ([id number]) Tissue Debrided: N/A N/A Necrotic/Eschar Level: N/A N/A Non-Viable Tissue Debridement Area (sq cm): N/A N/A 0.12 Instrument: N/A N/A Curette Bleeding: N/A N/A Moderate Hemostasis Achieved: N/A N/A Pressure Debridement Treatment N/A N/A Procedure was tolerated well Response: Post Debridement Measurements N/A N/A 0.3x0.4x0.1 L x W x D (cm) Post Debridement Volume: (cm) N/A N/A 0.009 Procedures Performed: N/A N/A Debridement Wound Number: 6 [id number] N/A Photos: N/A Wound Location: Left, Lateral Lower Leg Left, Lateral, Posterior Lower Leg N/A Wounding Event: Gradually Appeared Gradually Appeared N/A Primary Etiology: Venous Leg Ulcer Venous Leg Ulcer N/A Comorbid History: Cataracts, Asthma, Sleep Apnea, Cataracts, Asthma, Sleep Apnea, N/A Deep Vein Thrombosis, Deep Vein Thrombosis, Hypertension, Peripheral Venous Hypertension, Peripheral Venous Disease, Osteoarthritis, Received Disease, Osteoarthritis, Received Chemotherapy, Received Radiation Chemotherapy, Received Radiation Date Acquired: 01/19/2019 07/08/2019 N/A Weeks of Treatment: 56 32 N/A Wound Status: Open Healed - Epithelialized N/A Measurements L x W x D (cm) 0.7x0.6x0.1 0x0x0 N/A Area (cm) : 0.33 0 N/A Volume (cm) : 0.033 0 N/A % Reduction in Area: -50.00% 98.60% N/A % Reduction in Volume: -50.00% 98.20% N/A Classification: Full Thickness Without Exposed Full Thickness Without Exposed N/A Support Structures Support Structures Exudate Amount: Medium Medium  N/A Exudate Type: Serosanguineous Serosanguineous N/A Exudate Color: red, brown red, brown N/A Granulation Amount: Small (1-33%) Small (1-33%) N/A Granulation Quality: Red Pink N/A Necrotic Amount: Large (67-100%) Large (67-100%) N/A Necrotic Tissue: Eschar, Adherent Slough Eschar, Adherent Slough N/A Exposed Structures: Fat Layer (Subcutaneous Tissue) Fat Layer (Subcutaneous Tissue) N/A Exposed: Yes Exposed: Yes Fascia: No Fascia: No Tendon: No Tendon: No Muscle: No Muscle: No Joint: No Joint: No Bone: No Bone: No Epithelialization: None None N/A Debridement: Debridement - Selective/Open N/A N/A Wound Pre-procedure Verification/Time 13:52 N/A N/A Out Taken: Pain Control: Lidocaine N/A N/A Tissue Debrided: Necrotic/Eschar N/A N/A Level: Non-Viable Tissue N/A N/A Debridement Area (sq cm): 0.42 N/A N/A Instrument: Curette N/A N/A Bleeding: Moderate N/A N/A Hemostasis Achieved: Pressure N/A N/A GWEN, WOGOMAN. ([id number]) Debridement Treatment Procedure was tolerated well N/A N/A Response: Post Debridement Measurements 0.7x0.6x0.1 N/A N/A L x W x D (cm) Post Debridement Volume: (cm) 0.033 N/A N/A Procedures Performed: Debridement N/A N/A Treatment Notes Wound #10 (Left, Proximal, Lateral  Lower Leg) Notes Bilateral: alginate, ABD, 4 layer TCA Wound #11 (Right, Medial Lower Leg) Notes Bilateral: alginate, ABD, 4 layer TCA Wound #5 (Left, Medial Lower Leg) Notes Bilateral: alginate, ABD, 4 layer TCA Wound #6 (Left, Lateral Lower Leg) Notes Bilateral: alginate, ABD, 4 layer TCA Wound #9 (Left, Lateral, Posterior Lower Leg) Notes Bilateral: alginate, ABD, 4 layer TCA Electronic Signature(s) Signed: 02/17/2020 4:08:23 PM By: Linton Ham MD Entered By: Linton Ham on 02/17/2020 14:01:21 Frosch, [name] -------------------------------------------------------------------------------- Multi-Disciplinary Care Plan Details Patient Name: [name], [name]. Date of Service: 02/17/2020 1:15 PM Medical Record Number: [id number] Patient Account Number: [id number] Date of Birth/Sex: 08/09/1946 (74 y.o. F) Treating RN: Cornell Barman Primary Care Macedonio Scallon: [name] Other Clinician: Referring Manasa Spease: [name] Treating Benancio Osmundson/Extender: Tito Dine in Treatment: 31 Active Inactive Medication Nursing Diagnoses: Knowledge deficit related to medication safety: actual or potential Goals: Patient/caregiver will demonstrate understanding of new oral/IV medications prescribed at the [facility] (topical prescriptions are covered under the skin breakdown problem) Date Initiated: 12/16/2019 Target Resolution Date: 01/13/2020 Goal Status: Active Interventions: Assess for medication contraindications each visit where new medications are prescribed Treatment Activities: New medication prescribed at [place] : 12/16/2019 Notes: Soft Tissue Infection Nursing Diagnoses: Impaired tissue integrity Goals: Patient's soft tissue infection will resolve Date Initiated: 12/10/2018 Target Resolution Date: 01/09/2019 Goal Status: Active Interventions: Assess signs and symptoms of infection every visit Notes: Venous Leg Ulcer Nursing Diagnoses: Actual venous Insuffiency (use after diagnosis is confirmed) Goals: Patient will maintain optimal edema control Date Initiated: 12/10/2018 Target Resolution Date: 01/09/2019 Goal Status: Active Interventions: Assess peripheral edema status every visit. Treatment Activities: Therapeutic compression applied : 12/10/2018 KATERRIA, LUKASIK ([id number]) Notes: Wound/Skin Impairment Nursing Diagnoses: Impaired tissue integrity Goals: Patient/caregiver will verbalize understanding of skin care regimen Date Initiated: 12/10/2018 Target Resolution Date: 01/09/2019 Goal Status: Active Interventions: Assess ulceration(s) every visit Treatment Activities: Topical wound management initiated :  12/10/2018 Notes: Electronic Signature(s) Signed: 02/17/2020 4:30:28 PM By: [name], [name], RN, CWS, [name], [name] Entered By: [name], [name], RN, CWS, Kim on 02/17/2020 13:52:08 [name], [name] -------------------------------------------------------------------------------- Pain Assessment Details Patient Name: [name], [name]. Date of Service: 02/17/2020 1:15 PM Medical Record Number: [id number] Patient Account Number: [id number] Date of Birth/Sex: January 13, 1946 (74 y.o. F) Treating RN: Army Melia Primary Care Karalynn Cottone: [name] Other Clinician: Referring Kaevon [name]: [name] Treating Thula Stewart/Extender: Tito Dine in Treatment: 80 Active Problems Location of Pain Severity and Description of Pain Patient Has Paino No Site Locations Pain Management and Medication Current Pain Management: Electronic Signature(s) Signed: 02/18/2020 9:44:09 AM By: Army Melia Entered By: Army Melia on 02/17/2020 13:36:18 [name], [name] -------------------------------------------------------------------------------- Patient/Caregiver Education Details Patient Name: [name], [name]. Date of Service: 02/17/2020 1:15 PM Medical Record Number: [id number] Patient Account Number: [id number] Date of Birth/Gender: November 14, 1945 (74 y.o. F) Treating RN: Cornell Barman Primary Care Physician: [name] Other Clinician: Referring Physician: [name] Treating Physician/Extender: Tito Dine in Treatment: 93 Education Assessment Education Provided To: Patient Education Topics Provided Wound Debridement: Handouts: Wound Debridement Methods: Demonstration, Explain/Verbal Responses: State content correctly Wound/Skin Impairment: Handouts: Caring for Your Ulcer Methods: Demonstration, Explain/Verbal Responses: State content correctly Electronic Signature(s) Signed: 02/17/2020 4:30:28 PM By: [name], [name], RN, CWS, [name], [name] Entered By: [name], [name],  RN, CWS, Kim on 02/17/2020 13:57:42 [name], [name] -------------------------------------------------------------------------------- Wound Assessment Details Patient Name: [name], [name]. Date of Service: 02/17/2020 1:15 PM Medical Record Number: [id number] Patient Account Number: [id number] Date  of Birth/Sex: 1946/04/10 (74 y.o. F) Treating RN: Cornell Barman Primary Care Dewell Monnier: [name] Other Clinician: Referring Tirrell Buchberger: [name] Treating Khale Nigh/Extender: Tito Dine in Treatment: 93 Wound Status Wound Number: 10 Primary Venous Leg Ulcer Etiology: Wound Location: Left, Proximal, Lateral Lower Leg Wound Healed - Epithelialized Wounding Event: Blister Status: Date Acquired: 01/27/2020 Comorbid Cataracts, Asthma, Sleep Apnea, Deep Vein Thrombosis, Weeks Of Treatment: 3 History: Hypertension, Peripheral Venous Disease, Osteoarthritis, Clustered Wound: No Received Chemotherapy, Received Radiation Photos Wound Measurements Length: (cm) Width: (cm) Depth: (cm) Area: (cm) Volume: (cm) 0 % Reduction in Area: 99.3% 0 % Reduction in Volume: 99.1% 0 Epithelialization: None 0 Tunneling: No 0 Undermining: No Wound Description Classification: Full Thickness Without Exposed Support Struct Exudate Amount: Medium Exudate Type: Serosanguineous Exudate Color: red, brown ures Foul Odor After Cleansing: No Slough/Fibrino Yes Wound Bed Granulation Amount: Small (1-33%) Exposed Structure Granulation Quality: Red Fascia Exposed: No Necrotic Amount: Large (67-100%) Fat Layer (Subcutaneous Tissue) Exposed: Yes Necrotic Quality: Eschar, Adherent Slough Tendon Exposed: No Muscle Exposed: No Joint Exposed: No Bone Exposed: No Treatment Notes Wound #10 (Left, Proximal, Lateral Lower Leg) Notes Bilateral: alginate, ABD, 4 layer TCA Electronic Signature(s) Signed: 02/17/2020 4:30:28 PM By: [name], [name], RN, CWS, [name], [name] [address], Zahari JMarland Kitchen  ([id number]) Entered By: [name], [name], RN, CWS, Kim on 02/17/2020 13:59:36 Jaworowski, [name] -------------------------------------------------------------------------------- Wound Assessment Details Patient Name: [name], [name]. Date of Service: 02/17/2020 1:15 PM Medical Record Number: [id number] Patient Account Number: [id number] Date of Birth/Sex: 21-Jul-1946 (74 y.o. F) Treating RN: Cornell Barman Primary Care Theodore Virgin: [name] Other Clinician: Referring Jhostin Epps: [name] Treating Kasten Leveque/Extender: Tito Dine in Treatment: 45 Wound Status Wound Number: 11 Primary Venous Leg Ulcer Etiology: Wound Location: Right, Medial Lower Leg Wound Healed - Epithelialized Wounding Event: Gradually Appeared Status: Date Acquired: 02/04/2020 Comorbid Cataracts, Asthma, Sleep Apnea, Deep Vein Thrombosis, Weeks Of Treatment: 1 History: Hypertension, Peripheral Venous Disease, Osteoarthritis, Clustered Wound: No Received Chemotherapy, Received Radiation Photos Wound Measurements Length: (cm) Width: (cm) Depth: (cm) Area: (cm) Volume: (cm) 0 % Reduction in Area: 99% 0 % Reduction in Volume: 98.7% 0 Epithelialization: None 0 Tunneling: No 0 Undermining: No Wound Description Classification: Full Thickness Without Exposed Support Struct Exudate Amount: Medium Exudate Type: Serosanguineous Exudate Color: red, brown ures Foul Odor After Cleansing: No Slough/Fibrino Yes Wound Bed Granulation Amount: Small (1-33%) Exposed Structure Granulation Quality: Red Fascia Exposed: No Necrotic Amount: Large (67-100%) Fat Layer (Subcutaneous Tissue) Exposed: Yes Necrotic Quality: Eschar, Adherent Slough Tendon Exposed: No Muscle Exposed: No Joint Exposed: No Bone Exposed: No Treatment Notes Wound #11 (Right, Medial Lower Leg) Notes Bilateral: alginate, ABD, 4 layer TCA Electronic Signature(s) Signed: 02/17/2020 4:30:28 PM By: [name], [name], RN, CWS, [name],  [name] [address] Aneshia JMarland Kitchen ([id number]) Entered By: [name], [name], RN, CWS, Kim on 02/17/2020 13:59:37 Marzec, [name] -------------------------------------------------------------------------------- Wound Assessment Details Patient Name: [name], FEDORCHAK. Date of Service: 02/17/2020 1:15 PM Medical Record Number: [id number] Patient Account Number: [id number] Date of Birth/Sex: 1945-11-12 (74 y.o. F) Treating RN: Army Melia Primary Care Asser Lucena: [name] Other Clinician: Referring Tamirah George: [name] Treating Fadel Clason/Extender: Tito Dine in Treatment: 62 Wound Status Wound Number: 5 Primary Lymphedema Etiology: Wound Location: Left, Medial Lower Leg Wound Open Wounding Event: Gradually Appeared Status: Date Acquired: 11/19/2018 Comorbid Cataracts, Asthma, Sleep Apnea, Deep Vein Thrombosis, Weeks Of Treatment: 62 History: Hypertension, Peripheral Venous Disease, Osteoarthritis, Clustered Wound: No Received Chemotherapy, Received Radiation Photos Wound Measurements Length: (cm) 0.3  Width: (cm) 0.4 Depth: (cm) 0.1 Area: (cm) 0.094 Volume: (cm) 0.009 % Reduction in Area: 98.9% % Reduction in Volume: 98.9% Epithelialization: None Tunneling: No Undermining: No Wound Description Classification: Full Thickness Without Exposed Support Struct Exudate Amount: Medium Exudate Type: Serosanguineous Exudate Color: red, brown ures Foul Odor After Cleansing: No Slough/Fibrino Yes Wound Bed Granulation Amount: Small (1-33%) Exposed Structure Granulation Quality: Red Fascia Exposed: No Necrotic Amount: Large (67-100%) Fat Layer (Subcutaneous Tissue) Exposed: Yes Necrotic Quality: Eschar, Adherent Slough Tendon Exposed: No Muscle Exposed: No Joint Exposed: No Bone Exposed: No Treatment Notes Wound #5 (Left, Medial Lower Leg) Notes Bilateral: alginate, ABD, 4 layer TCA Electronic Signature(s) Signed: 02/18/2020 9:44:09 AM By: Renee Rival ([id number]) Entered By: Army Melia on 02/17/2020 13:40:11 Mcpherson, [name] -------------------------------------------------------------------------------- Wound Assessment Details Patient Name: SHATISHA, [place]ILEN[place]. Date of Service: 02/17/2020 1:15 PM Medical Record Number: [id number] Patient Account Number: [id number] Date of Birth/Sex: [birth year] (74 y.o. F) Treating RN: Army Melia Primary Care Cato Liburd: [name] Other Clinician: Referring Dorean Hiebert: [name] Treating Arly Salminen/Extender: Tito Dine in Treatment: 30 Wound Status Wound Number: 6 Primary Venous Leg Ulcer Etiology: Wound Location: Left, Lateral Lower Leg Wound Open Wounding Event: Gradually Appeared Status: Date Acquired: 01/19/2019 Comorbid Cataracts, Asthma, Sleep Apnea, Deep Vein Thrombosis, Weeks Of Treatment: 56 History: Hypertension, Peripheral Venous Disease, Osteoarthritis, Clustered Wound: No Received Chemotherapy, Received Radiation Photos Wound Measurements Length: (cm) 0.7 Width: (cm) 0.6 Depth: (cm) 0.1 Area: (cm) 0.33 Volume: (cm) 0.033 % Reduction in Area: -50% % Reduction in Volume: -50% Epithelialization: None Tunneling: No Undermining: No Wound Description Classification: Full Thickness Without Exposed Support Struct Exudate Amount: Medium Exudate Type: Serosanguineous Exudate Color: red, brown ures Foul Odor After Cleansing: No Slough/Fibrino Yes Wound Bed Granulation Amount: Small (1-33%) Exposed Structure Granulation Quality: Red Fascia Exposed: No Necrotic Amount: Large (67-100%) Fat Layer (Subcutaneous Tissue) Exposed: Yes Necrotic Quality: Eschar, Adherent Slough Tendon Exposed: No Muscle Exposed: No Joint Exposed: No Bone Exposed: No Treatment Notes Wound #6 (Left, Lateral Lower Leg) Notes Bilateral: alginate, ABD, 4 layer TCA Electronic Signature(s) Signed: 02/18/2020 9:44:09 AM By: Renee Rival ([id number]) Entered By: Army Melia on 02/17/2020 13:40:50 Dace, [name] -------------------------------------------------------------------------------- Wound Assessment Details Patient Name: JAEYA, REGEL. Date of Service: 02/17/2020 1:15 PM Medical Record Number: [id number] Patient Account Number: [id number] Date of Birth/Sex: November 29, 1945 (74 y.o. F) Treating RN: Cornell Barman Primary Care Brighten Orndoff: [name] Other Clinician: Referring Min Tunnell: [name] Treating Starsky Nanna/Extender: Tito Dine in Treatment: 4 Wound Status Wound Number: 9 Primary Venous Leg Ulcer Etiology: Wound Location: Left, Lateral, Posterior Lower Leg Wound Healed - Epithelialized Wounding Event: Gradually Appeared Status: Date Acquired: 07/08/2019 Comorbid Cataracts, Asthma, Sleep Apnea, Deep Vein Thrombosis, Weeks Of Treatment: 32 History: Hypertension, Peripheral Venous Disease, Osteoarthritis, Clustered Wound: No Received Chemotherapy, Received Radiation Photos Wound Measurements Length: (cm) Width: (cm) Depth: (cm) Area: (cm) Volume: (cm) 0 % Reduction in Area: 98.6% 0 % Reduction in Volume: 98.2% 0 Epithelialization: None 0 Tunneling: No 0 Undermining: No Wound Description Classification: Full Thickness Without Exposed Support Struct Exudate Amount: Medium Exudate Type: Serosanguineous Exudate Color: red, brown ures Foul Odor After Cleansing: No Slough/Fibrino Yes Wound Bed Granulation Amount: Small (1-33%) Exposed Structure Granulation Quality: Pink Fascia Exposed: No Necrotic Amount: Large (67-100%) Fat Layer (Subcutaneous Tissue) Exposed: Yes Necrotic Quality: Eschar, Adherent Slough Tendon Exposed: No Muscle Exposed: No Joint Exposed: No Bone Exposed: No Treatment Notes Wound #9 (Left, Lateral, Posterior  Lower Leg) Notes Bilateral: alginate, ABD, 4 layer TCA Electronic Signature(s) Signed: 02/17/2020 4:30:28 PM By: [name],  [name], RN, CWS, [name], [name] [address] Aleina J. ([id number]) Entered By: [name], [name], RN, CWS, Kim on 02/17/2020 13:59:39 Crystalann, [place] [name] -------------------------------------------------------------------------------- Vitals Details Patient Name: WINTERROSE, PASCASIO. Date of Service: 02/17/2020 1:15 PM Medical Record Number: [id number] Patient Account Number: [id number] Date of Birth/Sex: [birth year] (74 y.o. F) Treating RN: Cornell Barman Primary Care Glenisha Gundry: [name] Other Clinician: Referring Billey Wojciak: [name] Treating Roderick Calo/Extender: Tito Dine in Treatment: 62 Vital Signs Time Taken: 13:20 Temperature (F): 98.5 Height (in): 63 Pulse (bpm): 82 Weight (lbs): 224.7 Respiratory Rate (breaths/min): 18 Body Mass Index (BMI): 39.8 Blood Pressure (mmHg): 137/68 Reference Range: 80 - 120 mg / dl Electronic Signature(s) Signed: 02/17/2020 3:16:46 PM By: Lorine Bears RCP, RRT, CHT Entered By: Becky Sax, Amado Nash on 02/17/2020 13:22:42

## 2020-02-22 ENCOUNTER — Other Ambulatory Visit: Payer: Self-pay

## 2020-02-22 DIAGNOSIS — I872 Venous insufficiency (chronic) (peripheral): Secondary | ICD-10-CM | POA: Diagnosis not present

## 2020-02-22 DIAGNOSIS — G4733 Obstructive sleep apnea (adult) (pediatric): Secondary | ICD-10-CM | POA: Diagnosis not present

## 2020-02-22 DIAGNOSIS — I890 Lymphedema, not elsewhere classified: Secondary | ICD-10-CM | POA: Diagnosis not present

## 2020-02-22 DIAGNOSIS — E876 Hypokalemia: Secondary | ICD-10-CM | POA: Diagnosis not present

## 2020-02-22 DIAGNOSIS — L97211 Non-pressure chronic ulcer of right calf limited to breakdown of skin: Secondary | ICD-10-CM | POA: Diagnosis not present

## 2020-02-22 DIAGNOSIS — L97221 Non-pressure chronic ulcer of left calf limited to breakdown of skin: Secondary | ICD-10-CM | POA: Diagnosis not present

## 2020-02-22 NOTE — Progress Notes (Signed)
JAEDAN, HALLMARK ([id number]) Visit Report for 02/22/2020 Arrival Information Details Patient Name: [name], [name]. Date of Service: 02/22/2020 8:00 AM Medical Record Number: [id number] Patient Account Number: [id number] Date of Birth/Sex: [birth year] (74 y.o. F) Treating RN: Montey Hora Primary Care Jovontae Banko: [name] Other Clinician: Referring Kendel Pesnell: [name] Treating Shiya Fogelman/Extender: Melburn Hake, HOYT Weeks in Treatment: 86 Visit Information History Since Last Visit Added or deleted any medications: No Patient Arrived: Ambulatory Any new allergies or adverse reactions: No Arrival Time: 08:04 Had a fall or experienced change in No Accompanied By: self activities of daily living that may affect Transfer Assistance: None risk of falls: Patient Identification Verified: Yes Signs or symptoms of abuse/neglect since last visito No Secondary Verification Process Completed: Yes Hospitalized since last visit: No Patient Requires Transmission-Based No Implantable device outside of the clinic excluding No Precautions: cellular tissue based products placed in the center Patient Has Alerts: Yes since last visit: Patient Alerts: Patient on Blood Has Dressing in Place as Prescribed: Yes Thinner Has Compression in Place as Prescribed: Yes aspirin 81 Pain Present Now: No Electronic Signature(s) Signed: 02/22/2020 12:02:39 PM By: Montey Hora Entered By: Montey Hora on 02/22/2020 09:05:11 [name] -------------------------------------------------------------------------------- Compression Therapy Details Patient Name: [name], [name]. Date of Service: 02/22/2020 8:00 AM Medical Record Number: [id number] Patient Account Number: [id number] Date of Birth/Sex: 20-May-1946 (74 y.o. F) Treating RN: Montey Hora Primary Care Ameyah Bangura: [name] Other Clinician: Referring Renesmay Nesbitt: [name] Treating Markita Stcharles/Extender: STONE III,  HOYT Weeks in Treatment: 62 Compression Therapy Performed for Wound Assessment: Wound #5 Left,Medial Lower Leg Performed By: Clinician Montey Hora, RN Compression Type: Four Layer Pre Treatment ABI: 1 Electronic Signature(s) Signed: 02/22/2020 12:02:39 PM By: Montey Hora Entered By: Montey Hora on 02/22/2020 09:07:28 [name] -------------------------------------------------------------------------------- Compression Therapy Details Patient Name: [name], [name]. Date of Service: 02/22/2020 8:00 AM Medical Record Number: [id number] Patient Account Number: [id number] Date of Birth/Sex: 1945-12-09 (74 y.o. F) Treating RN: Montey Hora Primary Care Payton Moder: [name] Other Clinician: Referring Jameel Quant: [name] Treating Janeann Paisley/Extender: STONE III, HOYT Weeks in Treatment: 62 Compression Therapy Performed for Wound Assessment: Wound #6 Left,Lateral Lower Leg Performed By: Clinician Montey Hora, RN Compression Type: Four Layer Pre Treatment ABI: 1 Electronic Signature(s) Signed: 02/22/2020 12:02:39 PM By: Montey Hora Entered By: Montey Hora on 02/22/2020 09:07:28 [name] -------------------------------------------------------------------------------- Encounter Discharge Information Details Patient Name: [name], [name]. Date of Service: 02/22/2020 8:00 AM Medical Record Number: [id number] Patient Account Number: [id number] Date of Birth/Sex: 1946-02-27 (74 y.o. F) Treating RN: Montey Hora Primary Care Leanor Voris: [name] Other Clinician: Referring Diavian Furgason: [name] Treating Barett Whidbee/Extender: Melburn Hake, HOYT Weeks in Treatment: 52 Encounter Discharge Information Items Discharge Condition: Stable Ambulatory Status: Ambulatory Discharge Destination: Home Transportation: Private Auto Accompanied By: self Schedule Follow-up Appointment: Yes Clinical Summary of Care: Electronic  Signature(s) Signed: 02/22/2020 12:02:39 PM By: Montey Hora Entered By: Montey Hora on 02/22/2020 [place], [place] ([id number]) -------------------------------------------------------------------------------- Wound Assessment Details Patient Name: [name], [name]. Date of Service: 02/22/2020 8:00 AM Medical Record Number: [id number] Patient Account Number: [id number] Date of Birth/Sex: 16-Jul-1946 (74 y.o. F) Treating RN: Montey Hora Primary Care Javier Gell: [name] Other Clinician: Referring Altheria Shadoan: [name] Treating Larwence Tu/Extender: STONE III, HOYT Weeks in Treatment: 62 Wound Status Wound Number: 5 Primary Lymphedema Etiology: Wound Location: Left, Medial Lower Leg Wound Open Wounding Event: Gradually Appeared Status: Date Acquired: 11/19/2018 Comorbid Cataracts, Asthma, Sleep Apnea, Deep Vein Thrombosis, Weeks Of Treatment: 62 History:  Hypertension, Peripheral Venous Disease, Osteoarthritis, Clustered Wound: No Received Chemotherapy, Received Radiation Photos Wound Measurements Length: (cm) 0.3 Width: (cm) 0.4 Depth: (cm) 0.1 Area: (cm) 0.094 Volume: (cm) 0.009 % Reduction in Area: 98.9% % Reduction in Volume: 98.9% Epithelialization: None Tunneling: No Undermining: No Wound Description Classification: Full Thickness Without Exposed Support Struct Exudate Amount: Medium Exudate Type: Serosanguineous Exudate Color: red, brown ures Foul Odor After Cleansing: No Slough/Fibrino Yes Wound Bed Granulation Amount: Small (1-33%) Exposed Structure Granulation Quality: Red Fascia Exposed: No Necrotic Amount: Large (67-100%) Fat Layer (Subcutaneous Tissue) Exposed: Yes Necrotic Quality: Eschar, Adherent Slough Tendon Exposed: No Muscle Exposed: No Joint Exposed: No Bone Exposed: No Treatment Notes Wound #5 (Left, Medial Lower Leg) Notes Bilateral: alginate, ABD, 4 layer TCA Electronic Signature(s) Signed: 02/22/2020 12:02:39 PM  By: Lambert Mody ([id number]) Entered By: Montey Hora on 02/22/2020 09:06:05 [name] -------------------------------------------------------------------------------- Wound Assessment Details Patient Name: [name], [name]. Date of Service: 02/22/2020 8:00 AM Medical Record Number: [id number] Patient Account Number: [id number] Date of Birth/Sex: 1946-07-12 (74 y.o. F) Treating RN: Montey Hora Primary Care Remonia Otte: [name] Other Clinician: Referring Cambelle [name]: [name] Treating Yuridiana Formanek/Extender: STONE III, HOYT Weeks in Treatment: 88 Wound Status Wound Number: 6 Primary Venous Leg Ulcer Etiology: Wound Location: Left, Lateral Lower Leg Wound Open Wounding Event: Gradually Appeared Status: Date Acquired: 01/19/2019 Comorbid Cataracts, Asthma, Sleep Apnea, Deep Vein Thrombosis, Weeks Of Treatment: 57 History: Hypertension, Peripheral Venous Disease, Osteoarthritis, Clustered Wound: No Received Chemotherapy, Received Radiation Photos Wound Measurements Length: (cm) 0.7 Width: (cm) 0.6 Depth: (cm) 0.1 Area: (cm) 0.33 Volume: (cm) 0.033 % Reduction in Area: -50% % Reduction in Volume: -50% Epithelialization: None Tunneling: No Undermining: No Wound Description Classification: Full Thickness Without Exposed Support Struct Exudate Amount: Medium Exudate Type: Serosanguineous Exudate Color: red, brown ures Foul Odor After Cleansing: No Slough/Fibrino Yes Wound Bed Granulation Amount: Small (1-33%) Exposed Structure Granulation Quality: Red Fascia Exposed: No Necrotic Amount: Large (67-100%) Fat Layer (Subcutaneous Tissue) Exposed: Yes Necrotic Quality: Eschar, Adherent Slough Tendon Exposed: No Muscle Exposed: No Joint Exposed: No Bone Exposed: No Treatment Notes Wound #6 (Left, Lateral Lower Leg) Notes Bilateral: alginate, ABD, 4 layer TCA Electronic Signature(s) Signed: 02/22/2020 12:02:39 PM By:  Lambert Mody ([id number]) Entered By: Montey Hora on 02/22/2020 09:06:35

## 2020-02-24 ENCOUNTER — Encounter: Payer: Medicare Other | Admitting: Internal Medicine

## 2020-02-24 ENCOUNTER — Other Ambulatory Visit: Payer: Self-pay

## 2020-02-24 DIAGNOSIS — I87332 Chronic venous hypertension (idiopathic) with ulcer and inflammation of left lower extremity: Secondary | ICD-10-CM | POA: Diagnosis not present

## 2020-02-24 DIAGNOSIS — G4733 Obstructive sleep apnea (adult) (pediatric): Secondary | ICD-10-CM | POA: Diagnosis not present

## 2020-02-24 DIAGNOSIS — I872 Venous insufficiency (chronic) (peripheral): Secondary | ICD-10-CM | POA: Diagnosis not present

## 2020-02-24 DIAGNOSIS — I890 Lymphedema, not elsewhere classified: Secondary | ICD-10-CM | POA: Diagnosis not present

## 2020-02-24 DIAGNOSIS — E876 Hypokalemia: Secondary | ICD-10-CM | POA: Diagnosis not present

## 2020-02-24 DIAGNOSIS — L97211 Non-pressure chronic ulcer of right calf limited to breakdown of skin: Secondary | ICD-10-CM | POA: Diagnosis not present

## 2020-02-24 DIAGNOSIS — L97221 Non-pressure chronic ulcer of left calf limited to breakdown of skin: Secondary | ICD-10-CM | POA: Diagnosis not present

## 2020-02-25 NOTE — Progress Notes (Signed)
[name] Visit Report for 02/24/2020 Arrival Information Details Patient Name: [name], [name]. Date of Service: 02/24/2020 12:45 PM Medical Record Number: [id number] Patient Account Number: [id number] Date of Birth/Sex: 02/21/1946 (74 y.o. F) Treating RN: Cornell Barman Primary Care Wandalene Abrams: [name] Other Clinician: Referring Rosealynn Mateus: [name] Treating Shawnise Peterkin/Extender: Tito Dine in Treatment: 56 Visit Information History Since Last Visit Added or deleted any medications: No Patient Arrived: Ambulatory Any new allergies or adverse reactions: No Arrival Time: 12:55 Had a fall or experienced change in No Accompanied By: self activities of daily living that may affect Transfer Assistance: None risk of falls: Patient Identification Verified: Yes Signs or symptoms of abuse/neglect since last visito No Secondary Verification Process Completed: Yes Hospitalized since last visit: No Patient Requires Transmission-Based No Implantable device outside of the clinic excluding No Precautions: cellular tissue based products placed in the center Patient Has Alerts: Yes since last visit: Patient Alerts: Patient on Blood Has Dressing in Place as Prescribed: Yes Thinner Has Compression in Place as Prescribed: Yes aspirin 81 Pain Present Now: No Electronic Signature(s) Signed: 02/24/2020 4:35:46 PM By: Lorine Bears RCP, RRT, CHT Entered By: Lorine Bears on 02/24/2020 12:55:49 Carey, [name] -------------------------------------------------------------------------------- Compression Therapy Details Patient Name: [name]. Date of Service: 02/24/2020 12:45 PM Medical Record Number: [id number] Patient Account Number: [id number] Date of Birth/Sex: 1946-02-04 (74 y.o. F) Treating RN: Cornell Barman Primary Care Kaelene Elliston: [name] Other Clinician: Referring Orel Cooler: [name] Treating Providence Stivers/Extender: Tito Dine in Treatment: 63 Compression Therapy Performed for Wound Assessment: Wound #5 Left,Medial Lower Leg Performed By: Clinician Cornell Barman, RN Compression Type: Four Layer Pre Treatment ABI: 1 Post Procedure Diagnosis Same as Pre-procedure Electronic Signature(s) Signed: 02/25/2020 5:10:21 PM By: [name], [name], RN, CWS, [name], [name] Entered By: [name], [name], RN, CWS, Kim on 02/24/2020 13:16:57 [name] -------------------------------------------------------------------------------- Lower Extremity Assessment Details Patient Name: [name]. Date of Service: 02/24/2020 12:45 PM Medical Record Number: [id number] Patient Account Number: [id number] Date of Birth/Sex: 03-03-1946 (74 y.o. F) Treating RN: Montey Hora Primary Care Zamier Eggebrecht: [name] Other Clinician: Referring Aella Ronda: [name] Treating Kerina Simoneau/Extender: Tito Dine in Treatment: 63 Edema Assessment Assessed: [Left: No] [Right: No] Edema: [Left: Yes] [Right: Yes] Calf Left: Right: Point of Measurement: 33 cm From Medial Instep 49 cm 42 cm Ankle Left: Right: Point of Measurement: 10 cm From Medial Instep 22 cm 22.5 cm Vascular Assessment Pulses: Dorsalis Pedis Palpable: [Left:Yes] [Right:Yes] Electronic Signature(s) Signed: 02/24/2020 4:33:58 PM By: Montey Hora Entered By: Montey Hora on 02/24/2020 13:02:45 Pendergraft, [name] -------------------------------------------------------------------------------- Multi Wound Chart Details Patient Name: [name]. Date of Service: 02/24/2020 12:45 PM Medical Record Number: [id number] Patient Account Number: [id number] Date of Birth/Sex: [birth year] (74 y.o. F) Treating RN: Cornell Barman Primary Care Dewan Emond: [name] Other Clinician: Referring Darya Bigler: [name] Treating Vincent Streater/Extender: Tito Dine in Treatment:  38 Vital Signs Height(in): 84 Pulse(bpm): 32 Weight(lbs): 224.7 Blood Pressure(mmHg): 124/56 Body Mass Index(BMI): 40 Temperature(F): 98.4 Respiratory Rate(breaths/min): 18 Photos: [N/A:N/A] Wound Location: Left, Medial Lower Leg Left, Lateral Lower Leg N/A Wounding Event: Gradually Appeared Gradually Appeared N/A Primary Etiology: Lymphedema Venous Leg Ulcer N/A Comorbid History: Cataracts, Asthma, Sleep Apnea, Cataracts, Asthma, Sleep Apnea, N/A Deep Vein Thrombosis, Deep Vein Thrombosis, Hypertension, Peripheral Venous Hypertension, Peripheral Venous Disease, Osteoarthritis, Received Disease, Osteoarthritis, Received Chemotherapy, Received Radiation Chemotherapy, Received Radiation Date Acquired: 11/19/2018 01/19/2019 N/A Weeks of Treatment: 63 57 N/A  Wound Status: Open Open N/A Measurements L x W x D (cm) 0.8x0.7x0.1 1.2x1.3x0.1 N/A Area (cm) : 0.44 1.225 N/A Volume (cm) : 0.044 0.123 N/A % Reduction in Area: 94.90% -456.80% N/A % Reduction in Volume: 94.90% -459.10% N/A Classification: Full Thickness Without Exposed Full Thickness Without Exposed N/A Support Structures Support Structures Exudate Amount: Medium Medium N/A Exudate Type: Serosanguineous Serosanguineous N/A Exudate Color: red, brown red, brown N/A Granulation Amount: Large (67-100%) Large (67-100%) N/A Granulation Quality: Red Red N/A Necrotic Amount: Small (1-33%) Small (1-33%) N/A Exposed Structures: Fat Layer (Subcutaneous Tissue) Fat Layer (Subcutaneous Tissue) N/A Exposed: Yes Exposed: Yes Fascia: No Fascia: No Tendon: No Tendon: No Muscle: No Muscle: No Joint: No Joint: No Bone: No Bone: No Epithelialization: None None N/A Procedures Performed: Compression Therapy N/A N/A Treatment Notes Electronic Signature(s) LELIA, LAMPINEN ([id number]) Signed: 02/25/2020 7:42:01 AM By: Linton Ham MD Entered By: Linton Ham on 02/24/2020 13:20:58 [name]  ([id number]) -------------------------------------------------------------------------------- Multi-Disciplinary Care Plan Details Patient Name: [name]. Date of Service: 02/24/2020 12:45 PM Medical Record Number: [id number] Patient Account Number: [id number] Date of Birth/Sex: [birth year] (74 y.o. F) Treating RN: Cornell Barman Primary Care Farryn Linares: [name] Other Clinician: Referring Roma Bierlein: [name] Treating Deonte Otting/Extender: Tito Dine in Treatment: 40 Active Inactive Medication Nursing Diagnoses: Knowledge deficit related to medication safety: actual or potential Goals: Patient/caregiver will demonstrate understanding of new oral/IV medications prescribed at the [facility] (topical prescriptions are covered under the skin breakdown problem) Date Initiated: 12/16/2019 Target Resolution Date: 01/13/2020 Goal Status: Active Interventions: Assess for medication contraindications each visit where new medications are prescribed Treatment Activities: New medication prescribed at [place] : 12/16/2019 Notes: Soft Tissue Infection Nursing Diagnoses: Impaired tissue integrity Goals: Patient's soft tissue infection will resolve Date Initiated: 12/10/2018 Target Resolution Date: 01/09/2019 Goal Status: Active Interventions: Assess signs and symptoms of infection every visit Notes: Venous Leg Ulcer Nursing Diagnoses: Actual venous Insuffiency (use after diagnosis is confirmed) Goals: Patient will maintain optimal edema control Date Initiated: 12/10/2018 Target Resolution Date: 01/09/2019 Goal Status: Active Interventions: Assess peripheral edema status every visit. Treatment Activities: Therapeutic compression applied : 12/10/2018 AVALYNNE, KOMM ([id number]) Notes: Wound/Skin Impairment Nursing Diagnoses: Impaired tissue integrity Goals: Patient/caregiver will verbalize understanding of skin care regimen Date Initiated: 12/10/2018 Target Resolution  Date: 01/09/2019 Goal Status: Active Interventions: Assess ulceration(s) every visit Treatment Activities: Topical wound management initiated : 12/10/2018 Notes: Electronic Signature(s) Signed: 02/25/2020 5:10:21 PM By: [name], [name], RN, CWS, [name], [name] Entered By: [name], [name], RN, CWS, Kim on 02/24/2020 13:15:38 Priestly, [name] ([id number]) -------------------------------------------------------------------------------- Pain Assessment Details Patient Name: MOZETTA, WADDING. Date of Service: 02/24/2020 12:45 PM Medical Record Number: [id number] Patient Account Number: [id number] Date of Birth/Sex: 03-04-1946 (74 y.o. F) Treating RN: Montey Hora Primary Care Emileo Semel: [name] Other Clinician: Referring Onika [name]: [name] Treating Graceann Boileau/Extender: Tito Dine in Treatment: 11 Active Problems Location of Pain Severity and Description of Pain Patient Has Paino Yes Site Locations Pain Management and Medication Current Pain Management: Goals for Pain Management uncomfortable Electronic Signature(s) Signed: 02/24/2020 4:33:58 PM By: Montey Hora Entered By: Montey Hora on 02/24/2020 13:00:45 Vanwart, [name] ([id number]) -------------------------------------------------------------------------------- Wound Assessment Details Patient Name: KIRIANA, MOLAND. Date of Service: 02/24/2020 12:45 PM Medical Record Number: [id number] Patient Account Number: [id number] Date of Birth/Sex: 05/10/1946 (74 y.o. F) Treating RN: Montey Hora Primary Care Brand Siever: [name] Other Clinician: Referring Nicolle Heward: [name] Treating Rylee Nuzum/Extender: [name] Dillon Weeks in Treatment: 63 Wound Status Wound  Number: 5 Primary Lymphedema Etiology: Wound Location: Left, Medial Lower Leg Wound Open Wounding Event: Gradually Appeared Status: Date Acquired: 11/19/2018 Comorbid Cataracts, Asthma, Sleep Apnea, Deep Vein Thrombosis, Weeks Of Treatment:  63 History: Hypertension, Peripheral Venous Disease, Osteoarthritis, Clustered Wound: No Received Chemotherapy, Received Radiation Photos Wound Measurements Length: (cm) 0.8 Width: (cm) 0.7 Depth: (cm) 0.1 Area: (cm) 0.44 Volume: (cm) 0.044 % Reduction in Area: 94.9% % Reduction in Volume: 94.9% Epithelialization: None Tunneling: No Undermining: No Wound Description Classification: Full Thickness Without Exposed Support Structures Exudate Amount: Medium Exudate Type: Serosanguineous Exudate Color: red, brown Foul Odor After Cleansing: No Slough/Fibrino Yes Wound Bed Granulation Amount: Large (67-100%) Exposed Structure Granulation Quality: Red Fascia Exposed: No Necrotic Amount: Small (1-33%) Fat Layer (Subcutaneous Tissue) Exposed: Yes Necrotic Quality: Adherent Slough Tendon Exposed: No Muscle Exposed: No Joint Exposed: No Bone Exposed: No Electronic Signature(s) Signed: 02/24/2020 4:33:58 PM By: Montey Hora Entered By: Montey Hora on 02/24/2020 13:08:54 Thoman, [name] ([id number]) -------------------------------------------------------------------------------- Wound Assessment Details Patient Name: MARKIETA, BRISBY. Date of Service: 02/24/2020 12:45 PM Medical Record Number: [id number] Patient Account Number: [id number] Date of Birth/Sex: 1946/02/03 (74 y.o. F) Treating RN: Montey Hora Primary Care Ryatt Corsino: [name] Other Clinician: Referring Saanya Zieske: [name] Treating Draxton Luu/Extender: Tito Dine in Treatment: 54 Wound Status Wound Number: 6 Primary Venous Leg Ulcer Etiology: Wound Location: Left, Lateral Lower Leg Wound Open Wounding Event: Gradually Appeared Status: Date Acquired: 01/19/2019 Comorbid Cataracts, Asthma, Sleep Apnea, Deep Vein Thrombosis, Weeks Of Treatment: 57 History: Hypertension, Peripheral Venous Disease, Osteoarthritis, Clustered Wound: No Received Chemotherapy, Received  Radiation Photos Wound Measurements Length: (cm) 1.2 Width: (cm) 1.3 Depth: (cm) 0.1 Area: (cm) 1.225 Volume: (cm) 0.123 % Reduction in Area: -456.8% % Reduction in Volume: -459.1% Epithelialization: None Tunneling: No Undermining: No Wound Description Classification: Full Thickness Without Exposed Support Structures Exudate Amount: Medium Exudate Type: Serosanguineous Exudate Color: red, brown Foul Odor After Cleansing: No Slough/Fibrino Yes Wound Bed Granulation Amount: Large (67-100%) Exposed Structure Granulation Quality: Red Fascia Exposed: No Necrotic Amount: Small (1-33%) Fat Layer (Subcutaneous Tissue) Exposed: Yes Necrotic Quality: Adherent Slough Tendon Exposed: No Muscle Exposed: No Joint Exposed: No Bone Exposed: No Electronic Signature(s) Signed: 02/24/2020 4:33:58 PM By: Montey Hora Entered By: Montey Hora on 02/24/2020 13:09:35 Garlington, [name] ([id number]) -------------------------------------------------------------------------------- Vitals Details Patient Name: DKAYLA, MCNINCH. Date of Service: 02/24/2020 12:45 PM Medical Record Number: [id number] Patient Account Number: [id number] Date of Birth/Sex: July 15, 1946 (74 y.o. F) Treating RN: Cornell Barman Primary Care Terica Yogi: [name] Other Clinician: Referring Ebon Ketchum: [name] Treating Vita Currin/Extender: Tito Dine in Treatment: 63 Vital Signs Time Taken: 12:55 Temperature (F): 98.4 Height (in): 63 Pulse (bpm): 82 Weight (lbs): 224.7 Respiratory Rate (breaths/min): 18 Body Mass Index (BMI): 39.8 Blood Pressure (mmHg): 124/56 Reference Range: 80 - 120 mg / dl Electronic Signature(s) Signed: 02/24/2020 4:35:46 PM By: Lorine Bears RCP, RRT, CHT Entered By: Lorine Bears on 02/24/2020 12:56:12

## 2020-02-25 NOTE — Progress Notes (Signed)
[name], [name] Visit Report for 02/24/2020 HPI Details Patient Name: [name], [name]. Date of Service: 02/24/2020 12:45 PM Medical Record Number: [id number] Patient Account Number: [id number] Date of Birth/Sex: October 22, 1946 (74 y.o. F) Treating RN: Cornell Barman Primary Care Provider: [name] Other Clinician: Referring Provider: [name] Treating Provider/Extender: Tito Dine in Treatment: 25 History of Present Illness HPI Description: Pleasant 74 year old with history of chronic venous insufficiency. No diabetes or peripheral vascular disease. Left ABI 1.29. Questionable history of left lower extremity DVT. She developed a recurrent ulceration on her left lateral calf in December 2015, which she attributes to poor diet and subsequent lower extremity edema. She underwent endovenous laser ablation of her left greater saphenous vein in 2010. She underwent laser ablation of accessory branch of left GSV in April 2016 by Dr. Kellie Simmering at [facility]. She was previously wearing Unna boots, which she tolerated well. Tolerating 2 layer compression and cadexomer iodine. She returns to clinic for follow-up and is without new complaints. She denies any significant pain at this time. She reports persistent pain with pressure. No claudication or ischemic rest pain. No fever or chills. No drainage. READMISSION 11/13/16; this is a 74 year old woman who is not a diabetic. She is here for a review of a painful area on her left medial lower extremity. I note that she was seen here previously last year for wound I believe to be in the same area. At that time she had undergone previously a left greater saphenous vein ablation by Dr. Kellie Simmering and she had a ablation of the anterior accessory branch of the left greater saphenous vein in March 2016. Seeing that the wound actually closed over. In reviewing the history with her today the ulcer in this area has been recurrent. She describes  a biopsy of this area in 2009 that only showed stasis physiology. 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 Do not see that she has ever had arterial studies in the left leg. She tells me she has a follow-up with Dr. Kellie Simmering in roughly 10 days In any case she developed the reopening of this area roughly a month ago. On the background of this she describes rapidly increasing edema which has responded to Lasix 40 mg and metolazone 2.5 mg as well as the patient's lymph massage. She has been told she has both venous insufficiency and lymphedema but she cannot tolerate compression stockings 11/28/16; the patient saw Dr. Kellie Simmering recently. Per the patient he did arterial Dopplers in the office that did not show evidence of arterial insufficiency, per the patient he stated "treat this like an ordinary venous ulcer". She also saw her dermatologist Dr. Ronnald Ramp who felt that this was more of a vascular ulcer. In general things are improving although she arrives today with increasing bilateral lower extremity edema with weeping a deeper fluid through the wound on the left medial leg compatible with some degree of lymphedema 12/04/16; the patient's wound is fully epithelialized but I don't think fully healed. We will do another week of depression with Promogran and TCA however I suspect we'll be able to discharge her next week. This is a very unusual-looking wound which was initially a figure-of-eight type wound lying on its side surrounded by petechial like hemorrhage. She  has had venous ablation on this side. She apparently does not have an arterial issue per Dr. Kellie Simmering. She saw her dermatologist thought it was "vascular". Patient is definitely going to need ongoing compression and I talked about this  with her today she will go to elastic therapy after she leaves here next week 12/11/16; the patient's wound is not completely closed today. She has surrounding scar tissue and in further discussion with the patient it would appear that she had ulcers in this area in 2009 for a prolonged period of time ultimately requiring a punch biopsy of this area that only showed venous insufficiency. I did not previously pickup on this part of the history from the patient. 12/18/16; the patient's wound is completely epithelialized. There is no open area here. She has significant bilateral venous insufficiency with secondary lymphedema to a mild-to-moderate degree she does not have compression stockings.. She did not say anything to me when I was in the room, she told our intake nurse that she was still having pain in this area. This isn't unusual recurrent small open area. She is going to go to elastic therapy to obtain compression stockings. 12/25/16; the patient's wound is fully epithelialized. There is no open area here. The patient describes some continued episodic discomfort in this area medial left calf. However everything looks fine and healed here. She is been to elastic therapy and caught herself 15-20 mmHg stockings, they apparently were having trouble getting 20-30 mm stockings in her size 01/22/17; this is a patient we discharged from the clinic a month ago. She has a recurrent open wound on her medial left calf. She had 15 mm support stockings. I told her I thought she needed 20-30 mm compression stockings. She tells me that she has been ill with hospitalization secondary to asthma and is been found to have severe hypokalemia likely secondary to a combination of Lasix and metolazone. This morning she noted blistering and leaking fluid on the posterior part of her left leg. She called our intake nurse urgently and we was saw her this afternoon. She has not had any real discomfort here. I don't know that  she's been wearing any stockings on this leg for at least 2-3 days. ABIs in this clinic were 1.21 on the right and 1.3 on the left. She is previously seen vascular surgery who does not think that there is a peripheral arterial issue. 01/30/17; Patient arrives with no open wound on the left leg. She has been to elastic therapy and obtained 20-10mmhg below knee stockings and she has one on the right leg today. READMISSION 02/19/18; this Everage is a now 74 year old patient we've had in this clinic perhaps 3 times before. I had last looked at her from January 07 December 2016 with an area on the medial left leg. We discharged her on 12/25/16 however she had to be readmitted on 01/22/17 with a recurrence. I have in my notes that we discharged her on 20-30 mm stockings although she tells me she was only wearing support hose because she cannot get stockings on [name], [name]. ([id number]) predominantly related to her cervical spine surgery/issues. She has had previous ablations done by vein and vascular in [place] including a great saphenous vein ablation on the left with an anterior accessory branch ablation I think both of these were in 2016. On one of the previous visit she had a biopsy noted 2009 that was negative. She is not felt to have an arterial issue. She is not  a diabetic. She does have a history of obstructive sleep apnea hypertension asthma as well as chronic venous insufficiency and lymphedema. On this occasion she noted 2 dry scaly patch on her left leg. She tried to put lotion on this it didn't really help. There were 2 open areas.the patient has been seeing her primary physician from 02/05/18 through 02/14/18. She had Unna boots applied. The superior wound now on the lateral left leg has closed but she's had one wound that remains open on the lateral left leg. This is not the same spot as we dealt with in 2018. ABIs in this clinic were 1.3 bilaterally 02/26/18; patient has a small wound on the  left lateral calf. Dimensions are down. She has chronic venous insufficiency and lymphedema. 03/05/18; small open area on the left lateral calf. Dimensions are down. Tightly adherent necrotic debris over the surface of the wound which was difficult to remove. Also the dressing [over collagen] stuck to the wound surface. This was removed with some difficulty as well. Change the primary dressing to Hydrofera Blue ready 03/12/18; small open area on the left lateral calf. Comes in with tightly adherent surface eschar as well as some adherent Hydrofera Blue. 03/19/18; open area on the left lateral calf. Again adherent surface eschar as well as some adherent Hydrofera Blue nonviable subcutaneous tissue. She complained of pain all week even with the reduction from 4-3 layer compression I put on last week. Also she had an increase in her ankle and calf measurements probably related to the same thing. 03/26/18; open area on the left lateral calf. A very small open area remains here. We used silver alginate starting last week as the Hydrofera Blue seem to stick to the wound bed. In using 4-layer compression 04/02/18; the open area in the left lateral calf at some adherent slough which I removed there is no open area here. We are able to transition her into her own compression stocking. Truthfully I think this is probably his support hose. However this does not maintain skin integrity will be limited. She cannot put over the toe compression stockings on because of neck problems hand problems etc. She is allergic to the lining layer of juxta lites. We might be forced to use extremitease stocking should this fail READMIT 11/24/2018 Patient is now a 74 year old woman who is not a diabetic. She has been in this clinic on at least 3 previous occasions largely with recurrent wounds on her left leg secondary to chronic venous insufficiency with secondary lymphedema. Her situation is complicated by inability to get stockings  on and an allergy to neoprene which is apparently a component and at least juxta lites and other stockings. As a result she really has not been wearing any stockings on her legs. She tells [place] that roughly 2 or 3 weeks ago she started noticing a stinging sensation just above her ankle on the left medial aspect. She has been diagnosed with pseudogout and she wondered whether this was what she was experiencing. She tried to dress this with something she bought at the store however subsequently it pulled skin off and now she has an open wound that is not improving. She has been using Vaseline gauze with a cover bandage. She saw her primary doctor last week who put an Haematologist on her. ABIs in this clinic was 1.03 on the left 2/12; the area is on the left medial ankle. Odd-looking wound with what looks to be surface epithelialization but a multitude of  small petechial openings. This clearly not closed yet. We have been using silver alginate under 3 layer compression with TCA 2/19; the wound area did not look quite as good this week. Necrotic debris over the majority of the wound surface which required debridement. She continues to have a multitude of what looked to be small petechial openings. She reminds [place] that she had a biopsy on this initially during her first outbreak in 2015 in [place] dermatology. She expresses concern about this being a possible melanoma. 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 I assured her that this does not look anything like melanoma. Except for the petechial reaction it does look like a venous insufficiency area and she certainly has evidence of this on both sides 2/26; a difficult area on the left medial ankle. The patient clearly has chronic venous hypertension with some degree of lymphedema. The odd thing about the area is the small petechial hemorrhages. I am not really sure how  to explain this. This was present last time and this is not a compression injury. We have been using Hydrofera Blue which I changed to last week 3/4; still using Hydrofera Blue. Aggressive debridement today. She does not have known arterial issues. She has seen Dr. Kellie Simmering at [facility] vein and vascular and and has an ablation on the left. [Anterior accessory branch of the greater saphenous]. From what I remember they did not feel she had an arterial issue. The patient has had this area biopsied in 2009 at [facility] dermatology and by her recollection they said this was "stasis". She is also follow-up with dermatology locally who thought that this was more of a vascular issue 3/11; using Hydrofera Blue. Aggressive debridement today. She does not have an arterial issue. We are using 3 layer compression although we may need to go to 4. The patient has been in for multiple changes to her wrap since I last saw her a week ago. She says that the area was leaking. I do not have too much more information on what was found 01/19/19 on evaluation today patient was actually being seen for a nurse visit when unfortunately she had the area on her left lateral lower extremity as well as weeping from the right lower extremity that became apparent. Therefore we did end up actually seeing her for a full visit with myself. She is having some pain at this site as well but fortunately nothing too significant at this point. No fevers, chills, nausea, or vomiting noted at this time. 3/18-Patient is back to the clinic with the left leg venous leg ulcer, the ulcer is larger in size, has a surface that is densely adherent with fibrinous tissue, the Hydrofera Blue was used but is densely adherent and there was difficulty in removing it. The right lower extremity was also wrapped for weeping edema. Patient has a new area over the left lateral foot above the malleolus that is small and appears to have no debris with intact  surrounding skin. Patient is on increased dose of Lasix also as a means to edema management 3/25; the patient has a nonhealing venous ulcer on the medial left leg and last week developed a smaller area on the lateral left calf. We have been using Hydrofera Blue with a contact layer. 4/1; no major change in these wounds areas. Left medial and more recently left lateral calf. I tried Iodoflex last week to aid in debridement  she did not tolerate this. She stated her pain was terrible all week. She took the top layer of the 4 layer compression off. 4/8; the patient actually looks somewhat better in terms of her more prominent left lateral calf wound. There is some healthy looking tissue here. She is still complaining of a lot of discomfort. 4/15; patient in a lot of pain secondary to sciatica. She is on a prednisone taper prescribed by her primary physician. She has the 2 areas one on the [place], [employer]. ([id number]) left medial and more recently a smaller area on the left lateral calf. Both of these just above the malleoli 4/22; her back pain is better but she still states she is very uncomfortable and now feels she is intolerant to the [employer]. No real change in the wounds we have been using Sorbact. She has been previously intolerant to Iodoflex. There is not a lot of option about what we can use to debride this wound under compression that she no doubt needs. sHe states Ultram no longer works for her pain 4/29; no major change in the wounds slightly increased depth. Surface on the original medial wound perhaps somewhat improved however the more recent area on the lateral left ankle is 100% covered in very adherent debris we have been using Sorbact. She tolerates 4 layer compression well and her edema control is a lot better. She has not had to come in for a nurse check 5/6; no major change in the condition of the wounds. She did consent to debridement today which was done with some difficulty.  Continuing Sorbact. She did not tolerate Iodoflex. She was in for a check of her compression the day after we wrapped her last week this was adjusted but nothing much was found 5/13; no major change in the condition or area of the wounds. I was able to get a fairly aggressive debridement done on the lateral left leg wound. Even using Sorbact under compression. She came back in on Friday to have the wrap changed. She says she felt uncomfortable on the lateral aspect of her ankle. She has a long history of chronic venous insufficiency including previous ablation surgery on this side. 5/20-Patient returns for wounds on left leg with both wounds covered in slough, with the lateral leg wound larger in size, she has been in 3 layer compression and felt more comfortable, she describes pain in ankle, in leg and pins and needles in foot, and is about to try Pamelor for this 6/3; wounds on the left lateral and left medial leg. The area medially which is the most recent of the 2 seems to have had the largest increase in dimensions. We have been using Sorbac to try and debride the surface. She has been to see orthopedics they apparently did a plain x-ray that was indeterminant. Diagnosed her with neuropathy and they have ordered an MRI to determine if there is underlying osteomyelitis. This was not high on my thought list but I suppose it is prudent. We have advised her to make an appointment with vein and vascular in [place]. She has a history of a left greater saphenous and accessory vein ablations I wonder if there is anything else that can be done from a surgical point of view to help in these difficult refractory wounds. We have previously healed this wound on one occasion but it keeps on reopening [medial side] 6/10; deep tissue culture I did last week I think on the left medial wound showed  both moderate E. coli and moderate staph aureus [MSSA]. She is going to require antibiotics and I have chosen  Augmentin. We have been using Sorbact and we have made better looking wound surface on both sides but certainly no improvement in wound area. She was back in last Friday apparently for a dressing changes the wrap was hurting her outer left ankle. She has not managed to get a hold of vein and vascular in [place]. We are going to have to make her that appointment 6/17; patient is tolerating the Augmentin. She had an MRI that I think was ordered by orthopedic surgeon this did not show osteomyelitis or an abscess did suggest cellulitis. We have been using Sorbact to the lateral and medial ankles. We have been trying to arrange a follow-up appointment with vein and vascular in [place] or did her original ablations. We apparently an area sent the request to vein and vascular in [facility] 6/24; patient has completed the Augmentin. We do not yet have a vein and vascular appointment in [place]. I am not sure what the issue is here we have asked her to call tomorrow. We are using Sorbact. Making some improvements and especially the medial wound. Both surfaces however look better medial and lateral. 7/1; the patient has been in contact with vein and vascular in [place] but has not yet received an appointment. Using Sorbact we have gradually improve the wound surface with no improvement in surface area. She is approved for Apligraf but the wound surface still is not completely viable. She has not had to come in for a dressing change 7/8; the patient has an appointment with vein and vascular on 7/31 which is a Friday afternoon. She is concerned about getting back here for [place] to dress her wounds. I think it is important to have them goal for her venous reflux/history of ablations etc. to see if anything else can be done. She apparently tested positive for 1 of the blood tests with regards to lupus and saw a rheumatologist. He has raised the issue of vasculitis again. I have had this thought in the  past however the evidence seems overwhelming that this is a venous reflux etiology. If the rheumatologist tells me there is clinical and laboratory investigation is positive for lupus I will rethink this. 7/15; the patient's wound surfaces are quite a bit better. The medial area which was her original wound now has no depth although the lateral wound which was the more recent area actually appears larger. Both with viable surfaces which is indeed better. Using Sorbact. I wanted to use Apligraf on her however there is the issue of the vein and vascular appointment on 7/31 at 2:00 in the afternoon which would not allow her to get back to be rewrapped and they would no doubt remove the graft 7/22; the patient's wound surfaces have moderate amount of debris although generally look better. The lateral one is larger with 2 small satellite areas superiorly. We are waiting for her vein and vascular appointment on 7/31. She has been approved for Apligraf which I would like to use after th 7/29; wound surfaces have improved no debridement is required we have been using Sorbact. She sees vein and vascular on Friday with this so question of whether anything can be done to lessen the likelihood of recurrence and/or speed the healing of these areas. She is already had previous ablations. She no doubt has severe venous hypertension 8/5-Patient returns at 1 week, she was in [employer]  for 3 days by her podiatrist, we have been using so backed to the wound, she has increased pain in both the wounds on the left lower leg especially the more distal one on the lateral aspect 8/12-Patient returns at 1 week and she is agreeable to having debridement in both wounds on her left leg today. We have been using Sorbact, and vascular studies were reviewed at last visit 8/19; the patient arrives with her wounds fairly clean and no debridement is required. We have used Sorbact which is really done a nice job in cleaning up these  very difficult wound surfaces. The patient saw Dr. Donzetta Matters of vascular surgery on 7/31. He did not feel that there was an arterial component. He felt that her treated greater saphenous vein is adequately addressed and that the small saphenous vein did not appear to be involved significantly. She was also noted to have deep venous reflux which is not treatable. Dr. Donzetta Matters mentioned the possibility of a central obstructive component leading to reflux and he offered her central venography. She wanted to discuss this or think about it. I have urged her to go ahead with this. She has had recurrent difficult wounds in these areas which do heal but after months in the clinic. If there is anything that can be done to reduce the likelihood of this I think it is worth it. 9/2 she is still working towards getting follow-up with Dr. Donzetta Matters to schedule her CT. Things are quite a bit worse venography. I put Apligraf on 2 weeks ago on both wounds on the medial and lateral part of her left lower leg. She arrives in clinic today with 3 superficial additional wounds above the area laterally and one below the wound medially. She describes a lot of discomfort. I think these are probably wrapped injuries. Does not look like she has cellulitis. 07/20/2019 on evaluation today patient appears to be doing somewhat poorly in regard to her lower extremity ulcers. She in fact showed signs of erythema in fact we may even be dealing with an infection at this time. Unfortunately I am unsure if this is just infection or if indeed there may be some allergic reaction that occurred as a result of the Apligraf application. With that being said that would be unusual but nonetheless not impossible in this patient is one who is unfortunately allergic to quite a bit. Currently we have been using the Sorbact which seems to do as well as anything for her. I do think we may want to obtain a culture today to see if there is anything showing up there  that may need to be addressed. [name], [name] 9/16; noted that last week the wounds look worse in 1 week follow-up of the Apligraf. Using Sorbact as of 2 days ago. She arrives with copious amounts of drainage and new skin breakdown on the back of the left calf. The wounds arm more substantial bilaterally. There is a fair amount of swelling in the left calf no overt DVT there is edema present I think in the left greater than right thigh. She is supposed to go on 9/28 for CT venography. The wounds on the medial and lateral calf are worse and she has new skin breakdown posteriorly at least new for me. This is almost developing into a circumferential wound area The Apligraf was taken off last week which I agree with things are not going in the right direction a culture was done we do not have that  back yet. She is on Augmentin that she started 2 days ago 9/23; dressing was changed by her nurses on Monday. In general there is no improvement in the wound areas although the area looks less angry than last week. She did get Augmentin for MSSA cultured on the 14th. She still appears to have too much swelling in the left leg even with 3 layer compression 9/30; the patient underwent her procedure on 9/28 by Dr. Donzetta Matters at [facility]. She was discovered to have the common iliac vein measuring 12.2 mm but at the level of L4-L5 measured 3 mm. After stenting it measured 10 mm. 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 Rouleaux flow in the common femoral and femoral vein was observed much improved after stenting. We are using silver alginate to the wounds on the medial and lateral ankle on the left. 4 layer compression 10/7; the patient had fluid swelling around her knee and 4 layer compression. At the advice of vein and vascular this was reduced to 3 layer which she is tolerating better. We have been using silver alginate under 3 layer compression since last  Friday 10/14; arrives with the areas on the left ankle looking a lot better. Inflammation in the area also a lot better. She came in for a nurse check on 10/9 10/21; continued nice improvement. Slight improvements in surface area of both the medial and lateral wounds on the left. A lot of the satellite lesions in the weeping erythema around these from stasis dermatitis is resolved. We have been using silver alginate 10/28; general improvement in the entire wound areas although not a lot of change in dimensions the wound certainly looks better. There is a lot less in terms of venous inflammation. Continue silver alginate this week however look towards Hydrofera Blue next week 11/4; very adherent debris on the medial wound left wound is not as bad. We have been using silver alginate. Change to [facility] today 11/11; very adherent debris on both wound areas. She went to vein and vascular last week and follow-up they put in Jasper boot on this today. He says the [facility] was adherent. Wound is definitely not as good as last week. Especially on the left there the satellite lesions look more prominent 11/18; absolutely no better. erythema on lateral aspect with tenderness. 09/30/2019 on evaluation today patient appears to actually be doing better. Dr. Dellia Nims did put her on doxycycline last week which I do believe has helped her at this point. Fortunately there is no signs of active infection at this time. No fevers, chills, nausea, vomiting, or diarrhea. I do believe he may want extend the doxycycline for 7 additional days just to ensure everything does completely cleared up the patient is in agreement with that plan. Otherwise she is going require some sharp debridement today 12/2; patient is completing a 2-week course of doxycycline. I gave her this empirically for inflammation as well as infection when I last saw her 2 weeks ago. All of this seems to be better. She is using silver alginate she  has the area on the medial aspect of the larger area laterally and the 2 small satellite regions laterally above the major wound. 12/9; the patient's wound on the left medial and left lateral calf look really quite good. We have been using silver alginate. She saw vein and vascular in follow-up on 10/09/2019. She has had a previous left greater saphenous vein ablation by Dr. Oscar La in 2016.  More recently she underwent a left common iliac vein stent by Dr. 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 The swelling is improved and certainly the wounds have improved. The patient shows [place] today area on the right medial calf there is almost no wound but leaking lymphedema. She says she start this started 3 or 4 days ago. She did not traumatize it. It is not painful. She does not wear compression on that side 12/16; the patient continues to do well laterally. Medially still requiring debridement. The area on the right calf did not materialize to anything and is not currently open. We wrapped this last time. She has support stockings for that leg although I am not sure they are going to provide adequate compression 12/23; the lateral wound looks stable. Medially still requiring debridement for tightly adherent fibrinous debris. We've been using silver alginate. Surface area not any different 12/30; neither wound is any better with regards to surface and the area on the left lateral is larger. I been using silver alginate to the left lateral which look quite good last week and Sorbact to the left medial 11/11/2019. Lateral wound area actually looks better and somewhat smaller. Medial still requires a very aggressive debridement today. We have been using Sorbact on both wound areas 1/13; not much better still adherent debris bilaterally. I been using Sorbact. She has severe venous hypertension. Probably some degree of dermal fibrosis distally. I wonder whether tighter compression might help and I am going  to try that today. We also need to work on the bioburden 1/20; using Sorbact. She has severe venous hypertension status post stent placement for pelvic vein compression. We applied gentamicin last time to see if we could reduce bioburden I had some discussion with her today about the use of pentoxifylline. This is occasionally used in this setting for wounds with refractory venous insufficiency. However this interacts with Plavix. She tells me that she was put on this after stent placement for 3 months. She will call Dr. Claretha Cooper office to discuss 1/27; we are using gentamicin under Sorbact. 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 She has a stent. Wound medially is measuring smaller this week. Laterally measuring slightly larger although she has some satellite lesions superiorly 2/3; gentamicin under Sorbact under 4-layer compression. 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 She has a stent on Plavix. Her wounds are measuring smaller this week. More substantially laterally where there is a satellite lesion superiorly. 2/10; gentamicin under Sorbac. 4-layer compression. Patient communicated with Dr. Donzetta Matters at vein and vascular in [place]. He is okay with the patient coming off Plavix I will therefore start her on pentoxifylline for a 1 month trial. In general her wounds look better today. I had some concerns about swelling in the left thigh however she measures 61.5 on the right and 63 on the mid thigh which does not suggest there is any difficulty. The patient is not describing any pain. 2/17; gentamicin under Sorbac 4-layer compression. She has been on pentoxifylline for 1 week and complains of loose stool. No nausea she is eating and drinking well 2/24; the patient apparently came in 2 days ago for a nurse visit when her wrap fell down. Both areas look a little worse this week macerated medially and satellite lesions laterally. Change to  silver alginate today [name], [name] 3/3; wounds are larger today especially medially. She also has more swelling in her foot lower leg and I even  noted some swelling in her posterior thigh which is tender. I wonder about the patency of her stent. Fortuitously she sees Dr. Claretha Cooper group on Friday 3/10; Mrs. Toczek was seen by vein and vascular on 3/5. The patient underwent ultrasound. There was no evidence of thrombosis involving the IVC no evidence of thrombosis involving the right common iliac vein there is no evidence of thrombosis involving the right external iliac vein the left external vein is also patent. The right common iliac vein stent appears patent bilateral common femoral veins are compressible and appear patent. I was concerned about the left common iliac stent however it looks like this is functional. She has some edema in the posterior thigh that was tender she still has that this week. I also note they had trouble finding the pulses in her left foot and booked her for an ABI baseline in 4 weeks. She will follow up in 6 months for repeat IVC duplex. The patient stopped the pentoxifylline because of diarrhea. It does not look like that was being effective in any case. I have advised her to go back on her aspirin 81 mg tablet, vascular it also suggested this 3/17; comes in today with her wound surfaces a lot better. The excoriations from last week considerably better probably secondary to the TCA. We have been using silver alginate 3/24; comes in today with smaller wounds both medially and laterally. Both required debridement. There are 2 small satellite areas superiorly laterally. She also has a very odd bandlike area in the mid calf almost looking like there was a weakness in the wrap in a localized area. I would write this off as being this however anteriorly she has a small raised ballotable area that is very tender almost reminiscent of an abscess but there was no  obvious purulent surface to it. 02/04/20 upon evaluation today patient appears to be doing fairly well in regard to her wounds today. Fortunately there is no signs of active infection at this time. No fevers, chills, nausea, vomiting, or diarrhea. She has been tolerating the dressing changes without complication. Fortunately I feel like she is showing signs of improvement although has been sometime since have seen her. Nonetheless the area of concern that Dr. Dellia Nims had last week where she had possibly an area of the wrap that was we can allow the leg to bulge appears to be doing significantly better today there is no signs of anything worsening. 4/7; the patient's wounds on her medial and lateral left leg continue to contract. We have been using a regular alginate. Last week she developed an area on the right medial lower leg which is probably a venous ulcer as well. 4/14; the wounds on her left medial and lateral lower leg continue to contract. Surface eschar. We have been using regular alginate. The area on the right medial lower leg is closed. We have been putting both legs under 4-layer contraction. The patient went back to see vein and vascular she had arterial studies done which were apparently "quite good" per the patient although I have not read their notes I have never felt she had an arterial issue. 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 This is been longstanding and refractory to exercise, leg elevation and longstanding use of compression wraps in our clinic as well as compression stockings on the times we have been able to get these to heal 4/21; we thought she actually might be close this week however she arrives in  clinic with a lot of edema in her upper left calf and into her posterior thigh. This is been an intermittent problem here. She says the wrap fell down but it was replaced with a nurse visit on Monday. We are using  calcium alginate to the wounds and the wound sizes there not terribly larger than last week but there is a lot more edema Electronic Signature(s) Signed: 02/25/2020 7:42:01 AM By: Linton Ham MD Entered By: Linton Ham on 02/24/2020 13:22:50 Bohnsack, [name] ([id number]) -------------------------------------------------------------------------------- Physical Exam Details Patient Name: [name], [name]. Date of Service: 02/24/2020 12:45 PM Medical Record Number: [id number] Patient Account Number: [id number] Date of Birth/Sex: 02/06/1946 (74 y.o. F) Treating RN: Cornell Barman Primary Care Provider: [name] Other Clinician: Referring Provider: [name] Treating Provider/Extender: Tito Dine in Treatment: 2 Constitutional Sitting or standing Blood Pressure is within target range for patient.. Pulse regular and within target range for patient.Marland Kitchen Respirations regular, non- labored and within target range.. Temperature is normal and within the target range for the patient.Marland Kitchen appears in no distress. Cardiovascular Pedal pulses are palpable. Very significant nonpitting edema in the upper half of her left lower leg and to a certain extent in the posterior thigh there is no erythema.. Integumentary (Hair, Skin) No evidence of cellulitis. Notes Wound exam; I think the wounds are about the same left medial and left lateral surface eschar removed with saline and gauze. Is no evidence of infection. Electronic Signature(s) Signed: 02/25/2020 7:42:01 AM By: Linton Ham MD Entered By: Linton Ham on 02/24/2020 13:25:11 Firebaugh, [name] ([id number]) -------------------------------------------------------------------------------- Physician Orders Details Patient Name: MYRTICE, SCAVETTA. Date of Service: 02/24/2020 12:45 PM Medical Record Number: [id number] Patient Account Number: [id number] Date of Birth/Sex: 1945/12/10 (74 y.o. F) Treating RN: Cornell Barman Primary Care  Provider: [name] Other Clinician: Referring Provider: [name] Treating Provider/Extender: Tito Dine in Treatment: 71 Verbal / Phone Orders: No Diagnosis Coding Anesthetic (add to Medication List) Wound #5 Left,Medial Lower Leg o Topical Lidocaine 4% cream applied to wound bed prior to debridement (In Clinic Only). Wound #6 Left,Lateral Lower Leg o Topical Lidocaine 4% cream applied to wound bed prior to debridement (In Clinic Only). Skin Barriers/Peri-Wound Care Wound #5 Left,Medial Lower Leg o Triamcinolone Acetonide Ointment (TCA) - peri-wound Wound #6 Left,Lateral Lower Leg o Triamcinolone Acetonide Ointment (TCA) - peri-wound Primary Wound Dressing Wound #5 Left,Medial Lower Leg o Alginate Wound #6 Left,Lateral Lower Leg o Alginate Secondary Dressing Wound #5 Left,Medial Lower Leg o ABD pad Wound #6 Left,Lateral Lower Leg o ABD pad Dressing Change Frequency Wound #5 Left,Medial Lower Leg o Change dressing every week o Other: - Nurse visit Friday and Monday if needed. Wound #6 Left,Lateral Lower Leg o Change dressing every week o Other: - Nurse visit Friday and Monday if needed. Follow-up Appointments Wound #5 Left,Medial Lower Leg o Return Appointment in 1 week. o Nurse Visit as needed Wound #6 Left,Lateral Lower Leg o Return Appointment in 1 week. o Nurse Visit as needed Edema Control Wound #5 Left,Medial Lower Leg o 4 Layer Compression System - Bilateral Wound #6 Left,Lateral Lower Leg o 4 Layer Compression System - Bilateral [name], [name] Services and Therapies o Lymphedema Clinic - Order for Lymphedema Pumps Electronic Signature(s) Signed: 02/25/2020 7:42:01 AM By: Linton Ham MD Signed: 02/25/2020 5:10:21 PM By: [name], [name], RN, CWS, [name], [name] Entered By: [name], [name], RN, CWS, Kim on 02/24/2020 13:18:17 [name], [name]  ([id number]) -------------------------------------------------------------------------------- Problem List Details Patient  Name: KISWANA, POSTIGLIONE. Date of Service: 02/24/2020 12:45 PM Medical Record Number: [id number] Patient Account Number: [id number] Date of Birth/Sex: 07/20/1946 (74 y.o. F) Treating RN: Cornell Barman Primary Care Provider: [name] Other Clinician: Referring Provider: [name] Treating Provider/Extender: Tito Dine in Treatment: 61 Active Problems ICD-10 Evaluated Encounter Code Description Active Date Today Diagnosis L97.221 Non-pressure chronic ulcer of left calf limited to breakdown of skin 01/07/2019 No Yes I87.332 Chronic venous hypertension (idiopathic) with ulcer and inflammation of left 12/09/2019 No Yes lower extremity L97.211 Non-pressure chronic ulcer of right calf limited to breakdown of skin 02/10/2020 No Yes I89.0 Lymphedema, not elsewhere classified 12/10/2018 No Yes I82.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 Date of Service: 02/24/2020 12:45 PM Medical Record Number: [id number] Patient Account Number: [id number] Date of Birth/Sex: 1946/08/26 (74 y.o. F) Treating RN: Cornell Barman Primary Care Provider: [name] Other Clinician: Referring Provider: [name] Treating Provider/Extender: Tito Dine in Treatment: 61 Subjective History of Present Illness  (HPI) Pleasant 74 year old with history of chronic venous insufficiency. No diabetes or peripheral vascular disease. Left ABI 1.29. Questionable history of left lower extremity DVT. She developed a recurrent ulceration on her left lateral calf in December 2015, which she attributes to poor diet and subsequent lower extremity edema. She underwent endovenous laser ablation of her left greater saphenous vein in 2010. She underwent laser ablation of accessory branch of left GSV in April 2016 by Dr. Kellie Simmering at [facility]. She was previously wearing Unna boots, which she tolerated well. Tolerating 2 layer compression and cadexomer iodine. She returns to clinic for follow-up and is without new complaints. She denies any significant pain at this time. She reports persistent pain with pressure. No claudication or ischemic rest pain. No fever or chills. No drainage. READMISSION 11/13/16; this is a 74 year old woman who is not a diabetic. She is here for a review of a painful area on her left medial lower extremity. I note that she was seen here previously last year for wound I believe to be in the same area. At that time she had undergone previously a left greater saphenous vein ablation by Dr. Kellie Simmering and she had a ablation of the anterior accessory branch of the left greater saphenous vein in March 2016. Seeing that the wound actually closed over. In reviewing the history with her today the ulcer in this area has been recurrent. She describes a biopsy of this area in 2009 that only showed stasis physiology. 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 Do not see that she has ever had arterial studies in the left leg. She tells me she has a follow-up with Dr. Kellie Simmering in roughly 10 days In any case  she developed the reopening of this area roughly a month ago. On the background of this she describes rapidly increasing edema which has responded to Lasix 40  mg and metolazone 2.5 mg as well as the patient's lymph massage. She has been told she has both venous insufficiency and lymphedema but she cannot tolerate compression stockings 11/28/16; the patient saw Dr. Kellie Simmering recently. Per the patient he did arterial Dopplers in the office that did not show evidence of arterial insufficiency, per the patient he stated "treat this like an ordinary venous ulcer". She also saw her dermatologist Dr. Ronnald Ramp who felt that this was more of a vascular ulcer. In general things are improving although she arrives today with increasing bilateral lower extremity edema with weeping a deeper fluid through the wound on the left medial leg compatible with some degree of lymphedema 12/04/16; the patient's wound is fully epithelialized but I don't think fully healed. We will do another week of depression with Promogran and TCA however I suspect we'll be able to discharge her next week. This is a very unusual-looking wound which was initially a figure-of-eight type wound lying on its side surrounded by petechial like hemorrhage. She has had venous ablation on this side. She apparently does not have an arterial issue per Dr. Kellie Simmering. She saw her dermatologist thought it was "vascular". Patient is definitely going to need ongoing compression and I talked about this with her today she will go to elastic therapy after she leaves here next week 12/11/16; the patient's wound is not completely closed today. She has surrounding scar tissue and in further discussion with the patient it would appear that she had ulcers in this area in 2009 for a prolonged period of time ultimately requiring a punch biopsy of this area that only showed venous insufficiency. I did not previously pickup on this part of the history from the patient. 12/18/16; the  patient's wound is completely epithelialized. There is no open area here. She has significant bilateral venous insufficiency with secondary lymphedema to a mild-to-moderate degree she does not have compression stockings.. She did not say anything to me when I was in the room, she told our intake nurse that she was still having pain in this area. This isn't unusual recurrent small open area. She is going to go to elastic therapy to obtain compression stockings. 12/25/16; the patient's wound is fully epithelialized. There is no open area here. The patient describes some continued episodic discomfort in this area medial left calf. However everything looks fine and healed here. She is been to elastic therapy and caught herself 15-20 mmHg stockings, they apparently were having trouble getting 20-30 mm stockings in her size 01/22/17; this is a patient we discharged from the clinic a month ago. She has a recurrent open wound on her medial left calf. She had 15 mm support stockings. I told her I thought she needed 20-30 mm compression stockings. She tells me that she has been ill with hospitalization secondary to asthma and is been found to have severe hypokalemia likely secondary to a combination of Lasix and metolazone. This morning she noted blistering and leaking fluid on the posterior part of her left leg. She called our intake nurse urgently and we was saw her this afternoon. She has not had any real discomfort here. I don't know that she's been wearing any stockings on this leg for at least 2-3 days. ABIs in this clinic were 1.21 on the right and 1.3 on the left. She is previously seen vascular surgery who does not think that there is a peripheral arterial issue. 01/30/17; Patient arrives with no open wound on the left leg. She has  been to elastic therapy and obtained 20-68mmhg below knee stockings and she has one on the right leg today. READMISSION 02/19/18; this Laidig is a now 74 year old patient we've  had in this clinic perhaps 3 times before. I had last looked at her from January 07 December 2016 with an area on the medial left leg. We discharged her on 12/25/16 however she had to be readmitted on 01/22/17 with a recurrence. I have in my notes that we discharged her on 20-30 mm stockings although she tells me she was only wearing support hose because she cannot get stockings on predominantly related to her cervical spine surgery/issues. She has had previous ablations done by vein and vascular in [place] including a great saphenous vein ablation on the left with an anterior accessory branch ablation I think both of these were in 2016. On one of the previous visit she had a biopsy noted 2009 that was negative. She is not felt to have an arterial issue. She is not a diabetic. She does have a history of obstructive sleep apnea hypertension asthma as well as chronic venous insufficiency and lymphedema. MALIAKA, SLEVIN ([id number]) On this occasion she noted 2 dry scaly patch on her left leg. She tried to put lotion on this it didn't really help. There were 2 open areas.the patient has been seeing her primary physician from 02/05/18 through 02/14/18. She had Unna boots applied. The superior wound now on the lateral left leg has closed but she's had one wound that remains open on the lateral left leg. This is not the same spot as we dealt with in 2018. ABIs in this clinic were 1.3 bilaterally 02/26/18; patient has a small wound on the left lateral calf. Dimensions are down. She has chronic venous insufficiency and lymphedema. 03/05/18; small open area on the left lateral calf. Dimensions are down. Tightly adherent necrotic debris over the surface of the wound which was difficult to remove. Also the dressing [over collagen] stuck to the wound surface. This was removed with some difficulty as well. Change the primary dressing to Hydrofera Blue ready 03/12/18; small open area on the left lateral calf. Comes in  with tightly adherent surface eschar as well as some adherent Hydrofera Blue. 03/19/18; open area on the left lateral calf. Again adherent surface eschar as well as some adherent Hydrofera Blue nonviable subcutaneous tissue. She complained of pain all week even with the reduction from 4-3 layer compression I put on last week. Also she had an increase in her ankle and calf measurements probably related to the same thing. 03/26/18; open area on the left lateral calf. A very small open area remains here. We used silver alginate starting last week as the Hydrofera Blue seem to stick to the wound bed. In using 4-layer compression 04/02/18; the open area in the left lateral calf at some adherent slough which I removed there is no open area here. We are able to transition her into her own compression stocking. Truthfully I think this is probably his support hose. However this does not maintain skin integrity will be limited. She cannot put over the toe compression stockings on because of neck problems hand problems etc. She is allergic to the lining layer of juxta lites. We might be forced to use extremitease stocking should this fail READMIT 11/24/2018 Patient is now a 74 year old woman who is not a diabetic. She has been in this clinic on at least 3 previous occasions largely with recurrent wounds on her left leg secondary  to chronic venous insufficiency with secondary lymphedema. Her situation is complicated by inability to get stockings on and an allergy to neoprene which is apparently a component and at least juxta lites and other stockings. As a result she really has not been wearing any stockings on her legs. She tells [place] that roughly 2 or 3 weeks ago she started noticing a stinging sensation just above her ankle on the left medial aspect. She has been diagnosed with pseudogout and she wondered whether this was what she was experiencing. She tried to dress this with something she bought at the store  however subsequently it pulled skin off and now she has an open wound that is not improving. She has been using Vaseline gauze with a cover bandage. She saw her primary doctor last week who put an Haematologist on her. ABIs in this clinic was 1.03 on the left 2/12; the area is on the left medial ankle. Odd-looking wound with what looks to be surface epithelialization but a multitude of small petechial openings. This clearly not closed yet. We have been using silver alginate under 3 layer compression with TCA 2/19; the wound area did not look quite as good this week. Necrotic debris over the majority of the wound surface which required debridement. She continues to have a multitude of what looked to be small petechial openings. She reminds [place] that she had a biopsy on this initially during her first outbreak in 2015 in [place] dermat[facility]. She expresses concern about this being a possible melanoma. 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 I assured her that this does not look anything like melanoma. Except for the petechial reaction it does look like a venous insufficiency area and she certainly has evidence of this on both sides 2/26; a difficult area on the left medial ankle. The patient clearly has chronic venous hypertension with some degree of lymphedema. The odd thing about the area is the small petechial hemorrhages. I am not really sure how to explain this. This was present last time and this is not a compression injury. We have been using Hydrofera Blue which I changed to last week 3/4; still using Hydrofera Blue. Aggressive debridement today. She does not have known arterial issues. She has seen Dr. Kellie Simmering at [facility] vein and vascular and and has an ablation on the left. [Anterior accessory branch of the greater saphenous]. From what I remember they did not feel she had an arterial issue. The patient  has had this area biopsied in 2009 at [facility] dermatology and by her recollection they said this was "stasis". She is also follow-up with dermatology locally who thought that this was more of a vascular issue 3/11; using Hydrofera Blue. Aggressive debridement today. She does not have an arterial issue. We are using 3 layer compression although we may need to go to 4. The patient has been in for multiple changes to her wrap since I last saw her a week ago. She says that the area was leaking. I do not have too much more information on what was found 01/19/19 on evaluation today patient was actually being seen for a nurse visit when unfortunately she had the area on her left lateral lower extremity as well as weeping from the right lower extremity that became apparent. Therefore we did end up actually seeing her for a full visit with myself. She is having some pain at this site as  well but fortunately nothing too significant at this point. No fevers, chills, nausea, or vomiting noted at this time. 3/18-Patient is back to the clinic with the left leg venous leg ulcer, the ulcer is larger in size, has a surface that is densely adherent with fibrinous tissue, the Hydrofera Blue was used but is densely adherent and there was difficulty in removing it. The right lower extremity was also wrapped for weeping edema. Patient has a new area over the left lateral foot above the malleolus that is small and appears to have no debris with intact surrounding skin. Patient is on increased dose of Lasix also as a means to edema management 3/25; the patient has a nonhealing venous ulcer on the medial left leg and last week developed a smaller area on the lateral left calf. We have been using Hydrofera Blue with a contact layer. 4/1; no major change in these wounds areas. Left medial and more recently left lateral calf. I tried Iodoflex last week to aid in debridement she did not tolerate this. She stated her pain was  terrible all week. She took the top layer of the 4 layer compression off. 4/8; the patient actually looks somewhat better in terms of her more prominent left lateral calf wound. There is some healthy looking tissue here. She is still complaining of a lot of discomfort. 4/15; patient in a lot of pain secondary to sciatica. She is on a prednisone taper prescribed by her primary physician. She has the 2 areas one on the left medial and more recently a smaller area on the left lateral calf. Both of these just above the malleoli 4/22; her back pain is better but she still states she is very uncomfortable and now feels she is intolerant to the [employer]. No real change in the wounds we have been using Sorbact. She has been previously intolerant to Iodoflex. There is not a lot of option about what we can use to debride this wound under compression that she no doubt needs. sHe states Ultram no longer works for her pain [name], [name] 4/29; no major change in the wounds slightly increased depth. Surface on the original medial wound perhaps somewhat improved however the more recent area on the lateral left ankle is 100% covered in very adherent debris we have been using Sorbact. She tolerates 4 layer compression well and her edema control is a lot better. She has not had to come in for a nurse check 5/6; no major change in the condition of the wounds. She did consent to debridement today which was done with some difficulty. Continuing Sorbact. She did not tolerate Iodoflex. She was in for a check of her compression the day after we wrapped her last week this was adjusted but nothing much was found 5/13; no major change in the condition or area of the wounds. I was able to get a fairly aggressive debridement done on the lateral left leg wound. Even using Sorbact under compression. She came back in on Friday to have the wrap changed. She says she felt uncomfortable on the lateral aspect of  her ankle. She has a long history of chronic venous insufficiency including previous ablation surgery on this side. 5/20-Patient returns for wounds on left leg with both wounds covered in slough, with the lateral leg wound larger in size, she has been in 3 layer compression and felt more comfortable, she describes pain in ankle, in leg and pins and needles in foot, and is about  to try Pamelor for this 6/3; wounds on the left lateral and left medial leg. The area medially which is the most recent of the 2 seems to have had the largest increase in dimensions. We have been using Sorbac to try and debride the surface. She has been to see orthopedics they apparently did a plain x-ray that was indeterminant. Diagnosed her with neuropathy and they have ordered an MRI to determine if there is underlying osteomyelitis. This was not high on my thought list but I suppose it is prudent. We have advised her to make an appointment with vein and vascular in [place]. She has a history of a left greater saphenous and accessory vein ablations I wonder if there is anything else that can be done from a surgical point of view to help in these difficult refractory wounds. We have previously healed this wound on one occasion but it keeps on reopening [medial side] 6/10; deep tissue culture I did last week I think on the left medial wound showed both moderate E. coli and moderate staph aureus [MSSA]. She is going to require antibiotics and I have chosen Augmentin. We have been using Sorbact and we have made better looking wound surface on both sides but certainly no improvement in wound area. She was back in last Friday apparently for a dressing changes the wrap was hurting her outer left ankle. She has not managed to get a hold of vein and vascular in [place]. We are going to have to make her that appointment 6/17; patient is tolerating the Augmentin. She had an MRI that I think was ordered by orthopedic surgeon this  did not show osteomyelitis or an abscess did suggest cellulitis. We have been using Sorbact to the lateral and medial ankles. We have been trying to arrange a follow-up appointment with vein and vascular in [place] or did her original ablations. We apparently an area sent the request to vein and vascular in [facility] 6/24; patient has completed the Augmentin. We do not yet have a vein and vascular appointment in [place]. I am not sure what the issue is here we have asked her to call tomorrow. We are using Sorbact. Making some improvements and especially the medial wound. Both surfaces however look better medial and lateral. 7/1; the patient has been in contact with vein and vascular in [place] but has not yet received an appointment. Using Sorbact we have gradually improve the wound surface with no improvement in surface area. She is approved for Apligraf but the wound surface still is not completely viable. She has not had to come in for a dressing change 7/8; the patient has an appointment with vein and vascular on 7/31 which is a Friday afternoon. She is concerned about getting back here for [place] to dress her wounds. I think it is important to have them goal for her venous reflux/history of ablations etc. to see if anything else can be done. She apparently tested positive for 1 of the blood tests with regards to lupus and saw a rheumatologist. He has raised the issue of vasculitis again. I have had this thought in the past however the evidence seems overwhelming that this is a venous reflux etiology. If the rheumatologist tells me there is clinical and laboratory investigation is positive for lupus I will rethink this. 7/15; the patient's wound surfaces are quite a bit better. The medial area which was her original wound now has no depth although the lateral wound which was the more recent area  actually appears larger. Both with viable surfaces which is indeed better. Using Sorbact. I  wanted to use Apligraf on her however there is the issue of the vein and vascular appointment on 7/31 at 2:00 in the afternoon which would not allow her to get back to be rewrapped and they would no doubt remove the graft 7/22; the patient's wound surfaces have moderate amount of debris although generally look better. The lateral one is larger with 2 small satellite areas superiorly. We are waiting for her vein and vascular appointment on 7/31. She has been approved for Apligraf which I would like to use after th 7/29; wound surfaces have improved no debridement is required we have been using Sorbact. She sees vein and vascular on Friday with this so question of whether anything can be done to lessen the likelihood of recurrence and/or speed the healing of these areas. She is already had previous ablations. She no doubt has severe venous hypertension 8/5-Patient returns at 1 week, she was in [place] for 3 days by her podiatrist, we have been using so backed to the wound, she has increased pain in both the wounds on the left lower leg especially the more distal one on the lateral aspect 8/12-Patient returns at 1 week and she is agreeable to having debridement in both wounds on her left leg today. We have been using Sorbact, and vascular studies were reviewed at last visit 8/19; the patient arrives with her wounds fairly clean and no debridement is required. We have used Sorbact which is really done a nice job in cleaning up these very difficult wound surfaces. The patient saw Dr. Donzetta Matters of vascular surgery on 7/31. He did not feel that there was an arterial component. He felt that her treated greater saphenous vein is adequately addressed and that the small saphenous vein did not appear to be involved significantly. She was also noted to have deep venous reflux which is not treatable. Dr. Donzetta Matters mentioned the possibility of a central obstructive component leading to reflux and he offered her central  venography. She wanted to discuss this or think about it. I have urged her to go ahead with this. She has had recurrent difficult wounds in these areas which do heal but after months in the clinic. If there is anything that can be done to reduce the likelihood of this I think it is worth it. 9/2 she is still working towards getting follow-up with Dr. Donzetta Matters to schedule her CT. Things are quite a bit worse venography. I put Apligraf on 2 weeks ago on both wounds on the medial and lateral part of her left lower leg. She arrives in clinic today with 3 superficial additional wounds above the area laterally and one below the wound medially. She describes a lot of discomfort. I think these are probably wrapped injuries. Does not look like she has cellulitis. 07/20/2019 on evaluation today patient appears to be doing somewhat poorly in regard to her lower extremity ulcers. She in fact showed signs of erythema in fact we may even be dealing with an infection at this time. Unfortunately I am unsure if this is just infection or if indeed there may be some allergic reaction that occurred as a result of the Apligraf application. With that being said that would be unusual but nonetheless not impossible in this patient is one who is unfortunately allergic to quite a bit. Currently we have been using the Sorbact which seems to do as well as anything for  her. I do think we may want to obtain a culture today to see if there is anything showing up there that may need to be addressed. 9/16; noted that last week the wounds look worse in 1 week follow-up of the Apligraf. Using Sorbact as of 2 days ago. She arrives with copious amounts of drainage and new skin breakdown on the back of the left calf. The wounds arm more substantial bilaterally. There is a fair amount of swelling in the left calf no overt DVT there is edema present I think in the left greater than right thigh. She is supposed to go on 9/28 for CT [name], [name]. ([id number]) venography. The wounds on the medial and lateral calf are worse and she has new skin breakdown posteriorly at least new for me. This is almost developing into a circumferential wound area The Apligraf was taken off last week which I agree with things are not going in the right direction a culture was done we do not have that back yet. She is on Augmentin that she started 2 days ago 9/23; dressing was changed by her nurses on Monday. In general there is no improvement in the wound areas although the area looks less angry than last week. She did get Augmentin for MSSA cultured on the 14th. She still appears to have too much swelling in the left leg even with 3 layer compression 9/30; the patient underwent her procedure on 9/28 by Dr. Donzetta Matters at vascular and vein specialist. She was discovered to have the common iliac vein measuring 12.2 mm but at the level of L4-L5 measured 3 mm. After stenting it measured 10 mm. 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 Rouleaux flow in the common femoral and femoral vein was observed much improved after stenting. We are using silver alginate to the wounds on the medial and lateral ankle on the left. 4 layer compression 10/7; the patient had fluid swelling around her knee and 4 layer compression. At the advice of vein and vascular this was reduced to 3 layer which she is tolerating better. We have been using silver alginate under 3 layer compression since last Friday 10/14; arrives with the areas on the left ankle looking a lot better. Inflammation in the area also a lot better. She came in for a nurse check on 10/9 10/21; continued nice improvement. Slight improvements in surface area of both the medial and lateral wounds on the left. A lot of the satellite lesions in the weeping erythema around these from stasis dermatitis is resolved. We have been using silver alginate 10/28; general improvement in the entire wound areas although not a  lot of change in dimensions the wound certainly looks better. There is a lot less in terms of venous inflammation. Continue silver alginate this week however look towards Hydrofera Blue next week 11/4; very adherent debris on the medial wound left wound is not as bad. We have been using silver alginate. Change to [facility] today 11/11; very adherent debris on both wound areas. She went to vein and vascular last week and follow-up they put in [place] boot on this today. He says the [facility] was adherent. Wound is definitely not as good as last week. Especially on the left there the satellite lesions look more prominent 11/18; absolutely no better. erythema on lateral aspect with tenderness. 09/30/2019 on evaluation today patient appears to actually be doing better. Dr. Dellia Nims did put her on doxycycline last week which I  do believe has helped her at this point. Fortunately there is no signs of active infection at this time. No fevers, chills, nausea, vomiting, or diarrhea. I do believe he may want extend the doxycycline for 7 additional days just to ensure everything does completely cleared up the patient is in agreement with that plan. Otherwise she is going require some sharp debridement today 12/2; patient is completing a 2-week course of doxycycline. I gave her this empirically for inflammation as well as infection when I last saw her 2 weeks ago. All of this seems to be better. She is using silver alginate she has the area on the medial aspect of the larger area laterally and the 2 small satellite regions laterally above the major wound. 12/9; the patient's wound on the left medial and left lateral calf look really quite good. We have been using silver alginate. She saw vein and vascular in follow-up on 10/09/2019. She has had a previous left greater saphenous vein ablation by Dr. Oscar La in 2016. More recently she underwent a left common iliac vein stent by Dr. Donzetta Matters on 08/04/2019 due to May  Thurner type lesions. The swelling is improved and certainly the wounds have improved. The patient shows [place] today area on the right medial calf there is almost no wound but leaking lymphedema. She says she start this started 3 or 4 days ago. She did not traumatize it. It is not painful. She does not wear compression on that side 12/16; the patient continues to do well laterally. Medially still requiring debridement. The area on the right calf did not materialize to anything and is not currently open. We wrapped this last time. She has support stockings for that leg although I am not sure they are going to provide adequate compression 12/23; the lateral wound looks stable. Medially still requiring debridement for tightly adherent fibrinous debris. We've been using silver alginate. Surface area not any different 12/30; neither wound is any better with regards to surface and the area on the left lateral is larger. I been using silver alginate to the left lateral which look quite good last week and Sorbact to the left medial 11/11/2019. Lateral wound area actually looks better and somewhat smaller. Medial still requires a very aggressive debridement today. We have been using Sorbact on both wound areas 1/13; not much better still adherent debris bilaterally. I been using Sorbact. She has severe venous hypertension. Probably some degree of dermal fibrosis distally. I wonder whether tighter compression might help and I am going to try that today. We also need to work on the bioburden 1/20; using Sorbact. She has severe venous hypertension status post stent placement for pelvic vein compression. We applied gentamicin last time to see if we could reduce bioburden I had some discussion with her today about the use of pentoxifylline. This is occasionally used in this setting for wounds with refractory venous insufficiency. However this interacts with Plavix. She tells me that she was put on this after stent  placement for 3 months. She will call Dr. Claretha Cooper office to discuss 1/27; we are using gentamicin under Sorbact. 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 She has a stent. Wound medially is measuring smaller this week. Laterally measuring slightly larger although she has some satellite lesions superiorly 2/3; gentamicin under Sorbact under 4-layer compression. 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 She has a stent on Plavix. Her wounds are measuring smaller this week. More substantially laterally where there is a  satellite lesion superiorly. 2/10; gentamicin under Sorbac. 4-layer compression. Patient communicated with Dr. Donzetta Matters at vein and vascular in [place]. He is okay with the patient coming off Plavix I will therefore start her on pentoxifylline for a 1 month trial. In general her wounds look better today. I had some concerns about swelling in the left thigh however she measures 61.5 on the right and 63 on the mid thigh which does not suggest there is any difficulty. The patient is not describing any pain. 2/17; gentamicin under Sorbac 4-layer compression. She has been on pentoxifylline for 1 week and complains of loose stool. No nausea she is eating and drinking well 2/24; the patient apparently came in 2 days ago for a nurse visit when her wrap fell down. Both areas look a little worse this week macerated medially and satellite lesions laterally. Change to silver alginate today 3/3; wounds are larger today especially medially. She also has more swelling in her foot lower leg and I even noted some swelling in her posterior thigh which is tender. I wonder about the patency of her stent. Fortuitously she sees Dr. Claretha Cooper group on Friday 3/10; Mrs. Glazer was seen by vein and vascular on 3/5. The patient underwent ultrasound. There was no evidence of thrombosis involving the IVC no evidence of thrombosis involving the right common iliac vein  there is no evidence of thrombosis involving the right external iliac vein the left external Gallicchio, Ikia J. ([id number]) vein is also patent. The right common iliac vein stent appears patent bilateral common femoral veins are compressible and appear patent. I was concerned about the left common iliac stent however it looks like this is functional. She has some edema in the posterior thigh that was tender she still has that this week. I also note they had trouble finding the pulses in her left foot and booked her for an ABI baseline in 4 weeks. She will follow up in 6 months for repeat IVC duplex. The patient stopped the pentoxifylline because of diarrhea. It does not look like that was being effective in any case. I have advised her to go back on her aspirin 81 mg tablet, vascular it also suggested this 3/17; comes in today with her wound surfaces a lot better. The excoriations from last week considerably better probably secondary to the TCA. We have been using silver alginate 3/24; comes in today with smaller wounds both medially and laterally. Both required debridement. There are 2 small satellite areas superiorly laterally. She also has a very odd bandlike area in the mid calf almost looking like there was a weakness in the wrap in a localized area. I would write this off as being this however anteriorly she has a small raised ballotable area that is very tender almost reminiscent of an abscess but there was no obvious purulent surface to it. 02/04/20 upon evaluation today patient appears to be doing fairly well in regard to her wounds today. Fortunately there is no signs of active infection at this time. No fevers, chills, nausea, vomiting, or diarrhea. She has been tolerating the dressing changes without complication. Fortunately I feel like she is showing signs of improvement although has been sometime since have seen her. Nonetheless the area of concern that Dr. Dellia Nims had last week where she  had possibly an area of the wrap that was we can allow the leg to bulge appears to be doing significantly better today there is no signs of anything worsening. 4/7; the patient's wounds on her  medial and lateral left leg continue to contract. We have been using a regular alginate. Last week she developed an area on the right medial lower leg which is probably a venous ulcer as well. 4/14; the wounds on her left medial and lateral lower leg continue to contract. Surface eschar. We have been using regular alginate. The area on the right medial lower leg is closed. We have been putting both legs under 4-layer contraction. The patient went back to see vein and vascular she had arterial studies done which were apparently "quite good" per the patient although I have not read their notes I have never felt she had an arterial issue. 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 This is been longstanding and refractory to exercise, leg elevation and longstanding use of compression wraps in our clinic as well as compression stockings on the times we have been able to get these to heal 4/21; we thought she actually might be close this week however she arrives in clinic with a lot of edema in her upper left calf and into her posterior thigh. This is been an intermittent problem here. She says the wrap fell down but it was replaced with a nurse visit on Monday. We are using calcium alginate to the wounds and the wound sizes there not terribly larger than last week but there is a lot more edema Objective Constitutional Sitting or standing Blood Pressure is within target range for patient.. Pulse regular and within target range for patient.Marland Kitchen Respirations regular, non- labored and within target range.. Temperature is normal and within the target range for the patient.Marland Kitchen appears in no distress. Vitals Time Taken: 12:55 PM, Height: 63 in, Weight: 224.7 lbs, BMI: 39.8,  Temperature: 98.4 F, Pulse: 82 bpm, Respiratory Rate: 18 breaths/min, Blood Pressure: 124/56 mmHg. Cardiovascular Pedal pulses are palpable. Very significant nonpitting edema in the upper half of her left lower leg and to a certain extent in the posterior thigh there is no erythema.. General Notes: Wound exam; I think the wounds are about the same left medial and left lateral surface eschar removed with saline and gauze. Is no evidence of infection. Integumentary (Hair, Skin) No evidence of cellulitis. Wound #5 status is Open. Original cause of wound was Gradually Appeared. The wound is located on the Left,Medial Lower Leg. The wound measures 0.8cm length x 0.7cm width x 0.1cm depth; 0.44cm^2 area and 0.044cm^3 volume. There is Fat Layer (Subcutaneous Tissue) Exposed exposed. There is no tunneling or undermining noted. There is a medium amount of serosanguineous drainage noted. There is large (67-100%) red granulation within the wound bed. There is a small (1-33%) amount of necrotic tissue within the wound bed including Adherent Slough. KRIZIA, JAIMES ([id number]) Wound #6 status is Open. Original cause of wound was Gradually Appeared. The wound is located on the Left,Lateral Lower Leg. The wound measures 1.2cm length x 1.3cm width x 0.1cm depth; 1.225cm^2 area and 0.123cm^3 volume. There is Fat Layer (Subcutaneous Tissue) Exposed exposed. There is no tunneling or undermining noted. There is a medium amount of serosanguineous drainage noted. There is large (67-100%) red granulation within the wound bed. There is a small (1-33%) amount of necrotic tissue within the wound bed including Adherent Slough. 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 There was a Four Layer Compression Therapy Procedure with a pre-treatment ABI of 1 by Cornell Barman, RN. Post procedure Diagnosis Wound #5: Same as Pre-Procedure Plan Anesthetic (add to Medication List): Wound #5 Left,Medial Lower Leg: Topical Lidocaine 4% cream applied to wound bed prior to debridement (In Clinic Only). Wound #6 Left,Lateral Lower Leg: Topical Lidocaine 4% cream applied to wound bed prior to debridement (In Clinic Only). Skin Barriers/Peri-Wound Care: Wound #5 Left,Medial Lower Leg: Triamcinolone Acetonide Ointment (TCA) - peri-wound Wound #6 Left,Lateral Lower Leg: Triamcinolone Acetonide Ointment (TCA) - peri-wound Primary Wound Dressing: Wound #5 Left,Medial Lower Leg: Alginate Wound #6 Left,Lateral Lower Leg: Alginate Secondary Dressing: Wound #5 Left,Medial Lower Leg: ABD pad Wound #6 Left,Lateral Lower Leg: ABD pad Dressing Change Frequency: Wound #5 Left,Medial Lower Leg: Change dressing every week Other: - Nurse visit Friday and Monday if needed. Wound #6 Left,Lateral Lower Leg: Change dressing every week Other: - Nurse visit Friday and Monday if needed. Follow-up Appointments: Wound #5 Left,Medial Lower Leg: Return Appointment in 1 week. Nurse Visit as needed SHEVETTE, TROUSDALE ([id number]) Wound #6 Left,Lateral Lower Leg: Return Appointment in 1 week. Nurse Visit as needed Edema Control: Wound #5 Left,Medial Lower Leg: 4 Layer Compression System - Bilateral Wound #6 Left,Lateral Lower Leg: 4 Layer Compression System - Bilateral Services and Therapies ordered were: Lymphedema Clinic - Order for Lymphedema Pumps 1. 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 2. She  episodically swells like this especially if her wraps fall down therefore I am less inclined to rule out an acute DVT. There is no evidence of cellulitis. 3. I did not change anything we use TCA, calcium alginate and 4-layer compression 4. This eventuality makes me more certain than ever that she is going to require external compression pumps predominantly on the left leg to maintain skin integrity 5. We are going to bring her back on Monday to change the external dressings and I will see her back next week Electronic Signature(s) Signed: 02/25/2020 7:42:01 AM By: Linton Ham MD Entered By: Linton Ham on 02/24/2020 13:26:44 Luka, [name] ([id number]) -------------------------------------------------------------------------------- SuperBill Details Patient Name: VONITA, DELAET. Date of Service: 02/24/2020 Medical Record Number: [id number] Patient Account Number: [id number] Date of Birth/Sex: Mar 09, 1946 (75 y.o. 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 Physician Procedures CPT4 Code: [id number] Description: [id number] - WC PHYS LEVEL 4 - EST PT Modifier: Quantity:  1 CPT4 Code: Description: ICD-10 Diagnosis Description L97.221 Non-pressure chronic ulcer of left calf limited to breakdown of skin I87.332 Chronic venous hypertension (idiopathic) with ulcer and inflammation of left  l I82.592 Chronic embolism and thrombosis of other  specified deep vein of left lower ext Modifier: ower extremity remity Quantity: Electronic Signature(s) Signed: 02/24/2020 5:35:46 PM By: [name], [name], RN, CWS, [name], [name] Signed: 02/25/2020 7:42:01 AM By: Linton Ham MD Entered By: [name], [name], RN, CWS, Kim on 02/24/2020 17:35:46

## 2020-02-27 ENCOUNTER — Other Ambulatory Visit: Payer: Self-pay | Admitting: Family Medicine

## 2020-02-29 ENCOUNTER — Other Ambulatory Visit: Payer: Self-pay

## 2020-02-29 DIAGNOSIS — L97221 Non-pressure chronic ulcer of left calf limited to breakdown of skin: Secondary | ICD-10-CM | POA: Diagnosis not present

## 2020-02-29 DIAGNOSIS — L97211 Non-pressure chronic ulcer of right calf limited to breakdown of skin: Secondary | ICD-10-CM | POA: Diagnosis not present

## 2020-02-29 DIAGNOSIS — I872 Venous insufficiency (chronic) (peripheral): Secondary | ICD-10-CM | POA: Diagnosis not present

## 2020-02-29 DIAGNOSIS — I890 Lymphedema, not elsewhere classified: Secondary | ICD-10-CM | POA: Diagnosis not present

## 2020-02-29 DIAGNOSIS — G4733 Obstructive sleep apnea (adult) (pediatric): Secondary | ICD-10-CM | POA: Diagnosis not present

## 2020-02-29 DIAGNOSIS — E876 Hypokalemia: Secondary | ICD-10-CM | POA: Diagnosis not present

## 2020-03-01 NOTE — Progress Notes (Signed)
[name], [name] Visit Report for 02/29/2020 Arrival Information Details Patient Name: [name], [name]. Date of Service: 02/29/2020 8:45 AM Medical Record Number:[id number] Patient Account Number: [id number] Date of Birth/Sex: 1946/01/21 (74 y.o. F) Treating RN: Montey Hora Primary Care Ramyah Pankowski: [name] Other Clinician: Referring Tashona Calk: [name] Treating Eevie Lapp/Extender:STONE III, HOYT Weeks in Treatment: 36 Visit Information History Since Last Visit Added or deleted any medications: No Patient Arrived: Ambulatory Any new allergies or adverse reactions: No Arrival Time: 09:12 Had a fall or experienced change in No Accompanied By: self activities of daily living that may affect Transfer Assistance: None risk of falls: Patient Identification Verified: Yes Signs or symptoms of abuse/neglect since last visito No Secondary Verification Process Completed: Yes Hospitalized since last visit: No Patient Requires Transmission-Based Precautions: No Implantable device outside of the clinic excluding No Patient Has Alerts: Yes cellular tissue based products placed in the center Patient Alerts: Patient on Blood Thinner since last visit: aspirin 81 Has Dressing in Place as Prescribed: Yes Has Compression in Place as Prescribed: Yes Pain Present Now: No Electronic Signature(s) Signed: 02/29/2020 4:47:11 PM By: Montey Hora Entered By: Montey Hora on 02/29/2020 09:28:03 [name] -------------------------------------------------------------------------------- Compression Therapy Details Patient Name: [name], [name]. Date of Service: 02/29/2020 8:45 AM Medical Record Number:[id number] Patient Account Number: [id number] Date of Birth/Sex: [birth year] (74 y.o. F) Treating RN: Montey Hora Primary Care Corby Vandenberghe: [name] Other Clinician: Referring Maryfer Tauzin: [name] Treating Shaquanna Lycan/Extender:STONE III, HOYT Weeks in  Treatment: 63 Compression Therapy Performed for Wound Assessment: Wound #5 Left,Medial Lower Leg Performed By: Clinician Montey Hora, RN Compression Type: Four Layer Pre Treatment ABI: 1 Electronic Signature(s) Signed: 02/29/2020 4:47:11 PM By: Montey Hora Entered By: Montey Hora on 02/29/2020 09:29:21 Lattner, [name] -------------------------------------------------------------------------------- Compression Therapy Details Patient Name: [name], [name]. Date of Service: 02/29/2020 8:45 AM Medical Record Number:[id number] Patient Account Number: [id number] Date of Birth/Sex: 07-Apr-1946 (74 y.o. F) Treating RN: Montey Hora Primary Care Mariaceleste Herrera: [name] Other Clinician: Referring Mishael Haran: [name] Treating Metha Kolasa/Extender:STONE III, HOYT Weeks in Treatment: 63 Compression Therapy Performed for Wound Assessment: Wound #6 Left,Lateral Lower Leg Performed By: Clinician Montey Hora, RN Compression Type: Four Layer Pre Treatment ABI: 1 Electronic Signature(s) Signed: 02/29/2020 4:47:11 PM By: Montey Hora Entered By: Montey Hora on 02/29/2020 [id number] [name] -------------------------------------------------------------------------------- Encounter Discharge Information Details Patient Name: [name], [name]. Date of Service: 02/29/2020 8:45 AM Medical Record Number:[id number] Patient Account Number: [id number] Date of Birth/Sex: March 01, 1946 (74 y.o. F) Treating RN: Montey Hora Primary Care Ramar Nobrega: [name] Other Clinician: Referring Fremont Skalicky: [name] Treating Eugena Rhue/Extender:STONE III, HOYT Weeks in Treatment: 67 Encounter Discharge Information Items Discharge Condition: Stable Ambulatory Status: Ambulatory Discharge Destination: Home Transportation: Private Auto Accompanied By: self Schedule Follow-up Appointment: Yes Clinical Summary of Care: Electronic Signature(s) Signed: 02/29/2020  4:47:11 PM By: Montey Hora Entered By: Montey Hora on 02/29/2020 09:30:12 Calandro, [name] -------------------------------------------------------------------------------- Wound Assessment Details Patient Name: [name], [name]. Date of Service: 02/29/2020 8:45 AM Medical Record Number:[id number] Patient Account Number: [id number] Date of Birth/Sex: [name] 24, 1946 (74 y.o. F) Treating RN: Montey Hora Primary Care Jeremey Bascom: [name] Other Clinician: Referring [place] Pelto: [name] Treating Kali Ambler/Extender:STONE III, HOYT Weeks in Treatment: 63 Wound Status Wound Number: 5 Primary Lymphedema Wound Location: Left, Medial Lower Leg Etiology: Wounding Event: Gradually Appeared Wound Open Date Acquired: 11/19/2018 Status: Weeks Of Treatment: 63 Comorbid Cataracts, Asthma, Sleep Apnea, Deep Vein Thrombosis, Clustered Wound: No History: Hypertension, Peripheral Venous Disease, Osteoarthritis, Received Chemotherapy,  Received Radiation Wound Measurements Length: (cm) 0.8 Width: (cm) 0.7 Depth: (cm) 0.1 Area: (cm) 0.44 Volume: (cm) 0.044 % Reduction in Area: 94.9% % Reduction in Volume: 94.9% Epithelialization: None Wound Description Classification: Full Thickness Without Exposed Support Structu Exudate Amount: Medium Exudate Type: Serosanguineous Exudate Color: red, brown res Foul Odor After Cleansing: No Slough/Fibrino Yes Wound Bed Granulation Amount: Large (67-100%) Exposed Structure Granulation Quality: Red Fascia Exposed: No Necrotic Amount: Small (1-33%) Fat Layer (Subcutaneous Tissue) Exposed: Yes Necrotic Quality: Adherent Slough Tendon Exposed: No Muscle Exposed: No Joint Exposed: No Bone Exposed: No Treatment Notes Wound #5 (Left, Medial Lower Leg) Notes Bilateral: alginate, ABD, 4 layer TCA Electronic Signature(s) Signed: 02/29/2020 4:47:11 PM By: Montey Hora Entered By: Montey Hora on 02/29/2020 09:28:45 Ellefson, [name]  ([id number]) -------------------------------------------------------------------------------- Wound Assessment Details Patient Name: [name], [name]. Date of Service: 02/29/2020 8:45 AM Medical Record Number:[id number] Patient Account Number: [id number] Date of Birth/Sex: 06-15-1946 (74 y.o. F) Treating RN: Montey Hora Primary Care Jamori Biggar: [name] Other Clinician: Referring Frady Taddeo: [name] Treating Ahmar Pickrell/Extender:STONE III, HOYT Weeks in Treatment: 18 Wound Status Wound Number: 6 Primary Venous Leg Ulcer Wound Location: Left, Lateral Lower Leg Etiology: Wounding Event: Gradually Appeared Wound Open Date Acquired: 01/19/2019 Status: Weeks Of Treatment: 58 Comorbid Cataracts, Asthma, Sleep Apnea, Deep Vein Thrombosis, Clustered Wound: No History: Hypertension, Peripheral Venous Disease, Osteoarthritis, Received Chemotherapy, Received Radiation Wound Measurements Length: (cm) 1.2 Width: (cm) 1.3 Depth: (cm) 0.1 Area: (cm) 1.225 Volume: (cm) 0.123 % Reduction in Area: -456.8% % Reduction in Volume: -459.1% Epithelialization: None Wound Description Classification: Full Thickness Without Exposed Support Structu Exudate Amount: Medium Exudate Type: Serosanguineous Exudate Color: red, brown res Foul Odor After Cleansing: No Slough/Fibrino Yes Wound Bed Granulation Amount: Large (67-100%) Exposed Structure Granulation Quality: Red Fascia Exposed: No Necrotic Amount: Small (1-33%) Fat Layer (Subcutaneous Tissue) Exposed: Yes Necrotic Quality: Adherent Slough Tendon Exposed: No Muscle Exposed: No Joint Exposed: No Bone Exposed: No Treatment Notes Wound #6 (Left, Lateral Lower Leg) Notes Bilateral: alginate, ABD, 4 layer TCA Electronic Signature(s) Signed: 02/29/2020 4:47:11 PM By: Montey Hora Entered By: Montey Hora on 02/29/2020 09:28:59

## 2020-03-02 ENCOUNTER — Other Ambulatory Visit: Payer: Self-pay

## 2020-03-02 ENCOUNTER — Encounter: Payer: Medicare Other | Admitting: Internal Medicine

## 2020-03-02 DIAGNOSIS — I872 Venous insufficiency (chronic) (peripheral): Secondary | ICD-10-CM | POA: Diagnosis not present

## 2020-03-02 DIAGNOSIS — G4733 Obstructive sleep apnea (adult) (pediatric): Secondary | ICD-10-CM | POA: Diagnosis not present

## 2020-03-02 DIAGNOSIS — L97211 Non-pressure chronic ulcer of right calf limited to breakdown of skin: Secondary | ICD-10-CM | POA: Diagnosis not present

## 2020-03-02 DIAGNOSIS — L97221 Non-pressure chronic ulcer of left calf limited to breakdown of skin: Secondary | ICD-10-CM | POA: Diagnosis not present

## 2020-03-02 DIAGNOSIS — I890 Lymphedema, not elsewhere classified: Secondary | ICD-10-CM | POA: Diagnosis not present

## 2020-03-02 DIAGNOSIS — E876 Hypokalemia: Secondary | ICD-10-CM | POA: Diagnosis not present

## 2020-03-03 NOTE — Progress Notes (Signed)
TOWANNA, KOELBL ([id number]) Visit Report for 03/02/2020 Arrival Information Details Patient Name: [name], [name]. Date of Service: 03/02/2020 12:45 PM Medical Record Number: [id number] Patient Account Number: [id number] Date of Birth/Sex: [birth year] (74 y.o. F) Treating RN: [name] Primary Care [name]: [name] Other Clinician: Referring [name]: [name] Treating [name]/Extender: [name] in Treatment: 48 Visit Information History Since Last Visit Added or deleted any medications: No Patient Arrived: Ambulatory Any new allergies or adverse reactions: No Arrival Time: 12:43 Had a fall or experienced change in No Accompanied By: self activities of daily living that may affect Transfer Assistance: None risk of falls: Patient Identification Verified: Yes Signs or symptoms of abuse/neglect since last visito No Secondary Verification Process Completed: Yes Hospitalized since last visit: No Patient Requires Transmission-Based No Implantable device outside of the clinic excluding No Precautions: cellular tissue based products placed in the center Patient Has Alerts: Yes since last visit: Patient Alerts: Patient on Blood Has Dressing in Place as Prescribed: Yes Thinner Has Compression in Place as Prescribed: Yes aspirin 81 Pain Present Now: No Electronic Signature(s) Signed: 03/02/2020 3:45:27 PM By: [name] RCP, RRT, CHT Entered By: [name] on 03/02/2020 12:47:08 [name] -------------------------------------------------------------------------------- Encounter Discharge Information Details Patient Name: [name], [name]. Date of Service: 03/02/2020 12:45 PM Medical Record Number: [id number] Patient Account Number: [id number] Date of Birth/Sex: 1946/07/24 (74 y.o. F) Treating RN: [name] Primary Care [name]: [name] Other Clinician: Referring [name]: [name] Treating [name]/Extender: [name] in Treatment: 69 Encounter Discharge Information Items Post Procedure Vitals Discharge Condition: Stable Temperature (F): 98.2 Ambulatory Status: Ambulatory Pulse (bpm): 79 Discharge Destination: Home Respiratory Rate (breaths/min): 18 Transportation: Private Auto Blood Pressure (mmHg): 160/84 Accompanied By: self Schedule Follow-up Appointment: Yes Clinical Summary of Care: Electronic Signature(s) Signed: 03/02/2020 4:39:52 PM By: [name], [name], RN, CWS, [name], [name] Entered By: [name], [name], RN, CWS, Kim on 03/02/2020 13:15:49 Yerkes, [name] -------------------------------------------------------------------------------- Lower Extremity Assessment Details Patient Name: [name], [name]. Date of Service: 03/02/2020 12:45 PM Medical Record Number: [id number] Patient Account Number: [id number] Date of Birth/Sex: 12/02/1945 (74 y.o. F) Treating RN: [name] Primary Care Brentin Shin: [name] Other Clinician: Referring Donnesha Karg: [name] Treating Caeley Dohrmann/Extender: [name] in Treatment: 64 Edema Assessment Assessed: [Left: No] [Right: No] Edema: [Left: Yes] [Right: Yes] Calf Left: Right: Point of Measurement: 33 cm From Medial Instep 46.5 cm 41 cm Ankle Left: Right: Point of Measurement: 10 cm From Medial Instep 22 cm 22 cm Vascular Assessment Pulses: Dorsalis Pedis Palpable: [Left:Yes] [Right:Yes] Electronic Signature(s) Signed: 03/02/2020 2:17:21 PM By: [name] Entered By: [name] on 03/02/2020 13:04:07 Kleine, [name] -------------------------------------------------------------------------------- Multi Wound Chart Details Patient Name: [name], [name]. Date of Service: 03/02/2020 12:45 PM Medical Record Number: [id number] Patient Account Number: [id number] Date of Birth/Sex: May 28, 1946 (74 y.o. F) Treating RN: [name] Primary Care Jonia Oakey:  [name] Other Clinician: Referring [name]: [name] Treating [name]/Extender: [name] in Treatment: 21 Vital Signs Height(in): 3 Pulse(bpm): 57 Weight(lbs): 224.7 Blood Pressure(mmHg): 160/84 Body Mass Index(BMI): 40 Temperature(F): 98.2 Respiratory Rate(breaths/min): 18 Photos: [N/A:N/A] Wound Location: Left, Medial Lower Leg Left, Lateral Lower Leg N/A Wounding Event: Gradually Appeared Gradually Appeared N/A Primary Etiology: Lymphedema Venous Leg Ulcer N/A Comorbid History: Cataracts, Asthma, Sleep Apnea, Cataracts, Asthma, Sleep Apnea, N/A Deep Vein Thrombosis, Deep Vein Thrombosis, Hypertension, Peripheral Venous Hypertension, Peripheral Venous Disease, Osteoarthritis, Received Disease, Osteoarthritis, Received Chemotherapy, Received Radiation  Chemotherapy, Received Radiation Date Acquired: 11/19/2018 01/19/2019 N/A Weeks of Treatment: 64 58 N/A Wound Status: Open Open N/A Measurements L x W x D (cm) 0.3x0.3x0.1 1.4x1.4x0.1 N/A Area (cm) : 0.071 1.539 N/A Volume (cm) : 0.007 0.154 N/A % Reduction in Area: 99.20% -599.50% N/A % Reduction in Volume: 99.20% -600.00% N/A Classification: Full Thickness Without Exposed Full Thickness Without Exposed N/A Support Structures Support Structures Exudate Amount: Medium Medium N/A Exudate Type: Serosanguineous Serosanguineous N/A Exudate Color: red, brown red, brown N/A Granulation Amount: Large (67-100%) Large (67-100%) N/A Granulation Quality: Red Red N/A Necrotic Amount: Small (1-33%) Small (1-33%) N/A Exposed Structures: Fat Layer (Subcutaneous Tissue) Fat Layer (Subcutaneous Tissue) N/A Exposed: Yes Exposed: Yes Fascia: No Fascia: No Tendon: No Tendon: No Muscle: No Muscle: No Joint: No Joint: No Bone: No Bone: No Epithelialization: None None N/A Debridement: Debridement - Excisional Debridement - Excisional N/A Pre-procedure Verification/Time 13:09 13:09 N/A Out  Taken: Pain Control: N/A Lidocaine N/A Tissue Debrided: Subcutaneous, Slough Subcutaneous, Slough N/A Level: Skin/Subcutaneous Tissue Skin/Subcutaneous Tissue N/A Debridement Area (sq cm): 0.09 1.96 N/A Instrument: Curette Curette N/A Bleeding: Minimum Minimum N/A Hemostasis Achieved: Pressure Pressure N/A Procedure was tolerated well Procedure was tolerated well N/A MINH, HAGMAN. ([id number]) Debridement Treatment Response: Post Debridement 0.3x0.3x0.2 1.4x1.4x0.2 N/A Measurements L x W x D (cm) Post Debridement Volume: 0.014 0.308 N/A (cm) Procedures Performed: Debridement Debridement N/A Treatment Notes Wound #5 (Left, Medial Lower Leg) Notes Bilateral: alginate, ABD, 4 layer TCA Wound #6 (Left, Lateral Lower Leg) Notes Bilateral: alginate, ABD, 4 layer TCA Electronic Signature(s) Signed: 03/02/2020 4:22:42 PM By: Linton Ham MD Entered By: Linton Ham on 03/02/2020 13:15:53 [place], [name] -------------------------------------------------------------------------------- [place] Details Patient Name: [name], [name]. Date of Service: 03/02/2020 12:45 PM Medical Record Number: [id number] Patient Account Number: [id number] Date of Birth/Sex: 1945/11/06 (74 y.o. F) Treating RN: [name] Primary Care Josh Nicolosi: [name] Other Clinician: Referring Saud Bail: [name] Treating Laurent Cargile/Extender: [name] in Treatment: 65 Active Inactive Medication Nursing Diagnoses: Knowledge deficit related to medication safety: actual or potential Goals: Patient/caregiver will demonstrate understanding of new oral/IV medications prescribed at the [facility] (topical prescriptions are covered under the skin breakdown problem) Date Initiated: 12/16/2019 Target Resolution Date: 01/13/2020 Goal Status: Active Interventions: Assess for medication contraindications each visit where new medications are prescribed Treatment  Activities: New medication prescribed at [place] : 12/16/2019 Notes: Soft Tissue Infection Nursing Diagnoses: Impaired tissue integrity Goals: Patient's soft tissue infection will resolve Date Initiated: 12/10/2018 Target Resolution Date: 01/09/2019 Goal Status: Active Interventions: Assess signs and symptoms of infection every visit Notes: Venous Leg Ulcer Nursing Diagnoses: Actual venous Insuffiency (use after diagnosis is confirmed) Goals: Patient will maintain optimal edema control Date Initiated: 12/10/2018 Target Resolution Date: 01/09/2019 Goal Status: Active Interventions: Assess peripheral edema status every visit. Treatment Activities: Therapeutic compression applied : 12/10/2018 Notes: Wound/Skin Impairment Nursing Diagnoses: EDWIN, LOISELLE ([id number]) Impaired tissue integrity Goals: Patient/caregiver will verbalize understanding of skin care regimen Date Initiated: 12/10/2018 Target Resolution Date: 01/09/2019 Goal Status: Active Interventions: Assess ulceration(s) every visit Treatment Activities: Topical wound management initiated : 12/10/2018 Notes: Electronic Signature(s) Signed: 03/02/2020 4:39:52 PM By: [name], [name], RN, CWS, [name], [name] Entered By: [name], [name], RN, CWS, Kim on 03/02/2020 13:09:20 SHERONDA, PROENZA ([id number]) -------------------------------------------------------------------------------- Pain Assessment Details Patient Name: [name], [name]. Date of Service: 03/02/2020 12:45 PM Medical Record Number: [id number] Patient Account Number: [id number] Date of Birth/Sex: Jun 21, 1946 (74 y.o. F) Treating RN: [name] Primary Care  Taheerah Guldin: [name] Other Clinician: Referring Oland Arquette: [name] Treating Gaynor Genco/Extender: [name] in Treatment: 33 Active Problems Location of Pain Severity and Description of Pain Patient Has Paino No Site Locations Pain Management and Medication Current Pain  Management: Electronic Signature(s) Signed: 03/02/2020 2:17:21 PM By: [name] Entered By: [name] on 03/02/2020 13:00:27 [name] -------------------------------------------------------------------------------- Patient/Caregiver Education Details Patient Name: [name], [name]. Date of Service: 03/02/2020 12:45 PM Medical Record Number: [id number] Patient Account Number: [id number] Date of Birth/Gender: 09/30/1946 (74 y.o. F) Treating RN: [name] Primary Care Physician: [name] Other Clinician: Referring Physician: Ria [name] Treating Physician/Extender: [name] in Treatment: 87 Education Assessment Education Provided To: Patient Education Topics Provided Venous: Handouts: Controlling Swelling with Multilayered Compression Wraps, Other: Use pumps daily Methods: Demonstration, Explain/Verbal Responses: State content correctly Electronic Signature(s) Signed: 03/02/2020 4:39:52 PM By: [name], [name], RN, CWS, [name], [name] Entered By: [name], [name], RN, CWS, Kim on 03/02/2020 13:13:59 [name] -------------------------------------------------------------------------------- Wound Assessment Details Patient Name: [name], [name]. Date of Service: 03/02/2020 12:45 PM Medical Record Number: [id number] Patient Account Number: [id number] Date of Birth/Sex: Apr 04, 1946 (74 y.o. F) Treating RN: [name] Primary Care Alyissa Whidbee: [name] Other Clinician: Referring Lylliana Kitamura: [name] Treating Ladeja Pelham/Extender: [name] in Treatment: 74 Wound Status Wound Number: 5 Primary Lymphedema Etiology: Wound Location: Left, Medial Lower Leg Wound Open Wounding Event: Gradually Appeared Status: Date Acquired: 11/19/2018 Comorbid Cataracts, Asthma, Sleep Apnea, Deep Vein Thrombosis, Weeks Of Treatment: 64 History: Hypertension, Peripheral Venous Disease, Osteoarthritis, Clustered Wound:  No Received Chemotherapy, Received Radiation Photos Wound Measurements Length: (cm) 0.3 Width: (cm) 0.3 Depth: (cm) 0.1 Area: (cm) 0.071 Volume: (cm) 0.007 % Reduction in Area: 99.2% % Reduction in Volume: 99.2% Epithelialization: None Wound Description Classification: Full Thickness Without Exposed Support Structures Exudate Amount: Medium Exudate Type: Serosanguineous Exudate Color: red, brown Foul Odor After Cleansing: No Slough/Fibrino Yes Wound Bed Granulation Amount: Large (67-100%) Exposed Structure Granulation Quality: Red Fascia Exposed: No Necrotic Amount: Small (1-33%) Fat Layer (Subcutaneous Tissue) Exposed: Yes Necrotic Quality: Adherent Slough Tendon Exposed: No Muscle Exposed: No Joint Exposed: No Bone Exposed: No Treatment Notes Wound #5 (Left, Medial Lower Leg) Notes Bilateral: alginate, ABD, 4 layer TCA Electronic Signature(s) Signed: 03/02/2020 2:17:21 PM By: Renee Rival ([id number]) Entered By: [name] on 03/02/2020 [place], [name] -------------------------------------------------------------------------------- Wound Assessment Details Patient Name: [name], [name]. Date of Service: 03/02/2020 12:45 PM Medical Record Number: [id number] Patient Account Number: [id number] Date of Birth/Sex: 1946-08-20 (74 y.o. F) Treating RN: [name] Primary Care Malynn Lucy: [name] Other Clinician: Referring Nemiah Kissner: [name] Treating Vitor Overbaugh/Extender: [name] in Treatment: 60 Wound Status Wound Number: 6 Primary Venous Leg Ulcer Etiology: Wound Location: Left, Lateral Lower Leg Wound Open Wounding Event: Gradually Appeared Status: Date Acquired: 01/19/2019 Comorbid Cataracts, Asthma, Sleep Apnea, Deep Vein Thrombosis, Weeks Of Treatment: 58 History: Hypertension, Peripheral Venous Disease, Osteoarthritis, Clustered Wound: No Received Chemotherapy, Received  Radiation Photos Wound Measurements Length: (cm) 1.4 Width: (cm) 1.4 Depth: (cm) 0.1 Area: (cm) 1.539 Volume: (cm) 0.154 % Reduction in Area: -599.5% % Reduction in Volume: -600% Epithelialization: None Wound Description Classification: Full Thickness Without Exposed Support Struct Exudate Amount: Medium Exudate Type: Serosanguineous Exudate Color: red, brown ures Foul Odor After Cleansing: No Slough/Fibrino Yes Wound Bed Granulation Amount: Large (67-100%) Exposed Structure Granulation Quality: Red Fascia Exposed: No Necrotic Amount: Small (1-33%) Fat Layer (Subcutaneous Tissue) Exposed: Yes Necrotic Quality:  Adherent Slough Tendon Exposed: No Muscle Exposed: No Joint Exposed: No Bone Exposed: No Treatment Notes Wound #6 (Left, Lateral Lower Leg) Notes Bilateral: alginate, ABD, 4 layer TCA Electronic Signature(s) Signed: 03/02/2020 2:17:21 PM By: Renee Rival ([id number]) Entered By: [name] on 03/02/2020 13:01:52 [place], [name] -------------------------------------------------------------------------------- Vitals Details Patient Name: [name], [name]. Date of Service: 03/02/2020 12:45 PM Medical Record Number: [id number] Patient Account Number: [id number] Date of Birth/Sex: [birth year] (74 y.o. F) Treating RN: [name] Primary Care Eziah Negro: [name] Other Clinician: Referring Fredrich Cory: [name] Treating Demira Gwynne/Extender: [name] in Treatment: 64 Vital Signs Time Taken: 12:45 Temperature (F): 98.2 Height (in): 63 Pulse (bpm): 79 Weight (lbs): 224.7 Respiratory Rate (breaths/min): 18 Body Mass Index (BMI): 39.8 Blood Pressure (mmHg): 160/84 Reference Range: 80 - 120 mg / dl Electronic Signature(s) Signed: 03/02/2020 3:45:27 PM By: [name] RCP, RRT, CHT Entered By: [name] on 03/02/2020 12:47:57

## 2020-03-03 NOTE — Progress Notes (Signed)
[name], [name] Visit Report for 03/02/2020 Debridement Details Patient Name: [name], [name]. Date of Service: 03/02/2020 12:45 PM Medical Record Number: [id number] Patient Account Number: [id number] Date of Birth/Sex: 03-Apr-1946 (74 y.o. F) Treating RN: Cornell Barman Primary Care Provider: [name] Other Clinician: Referring Provider: [name] Treating Provider/Extender: Tito Dine in Treatment: 64 Debridement Performed for Wound #5 Left,Medial Lower Leg Assessment: Performed By: Physician [name] Dillon, MD Debridement Type: Debridement Level of Consciousness (Pre- Awake and Alert procedure): Pre-procedure Verification/Time Out Yes - 13:09 Taken: Start Time: 13:09 Total Area Debrided (L x W): 0.3 (cm) x 0.3 (cm) = 0.09 (cm) Tissue and other material Viable, Non-Viable, Slough, Subcutaneous, Fibrin/Exudate, Slough debrided: Level: Skin/Subcutaneous Tissue Debridement Description: Excisional Instrument: Curette Bleeding: Minimum Hemostasis Achieved: Pressure End Time: 13:11 Response to Treatment: Procedure was tolerated well Level of Consciousness (Post- Awake and Alert procedure): Post Debridement Measurements of Total Wound Length: (cm) 0.3 Width: (cm) 0.3 Depth: (cm) 0.2 Volume: (cm) 0.014 Character of Wound/Ulcer Post Debridement: Stable Post Procedure Diagnosis Same as Pre-procedure Electronic Signature(s) Signed: 03/02/2020 4:22:42 PM By: Linton Ham MD Signed: 03/02/2020 4:39:52 PM By: [name], [name], RN, CWS, [name], [name] Entered By: Linton Ham on 03/02/2020 13:16:10 [name], [name] -------------------------------------------------------------------------------- Debridement Details Patient Name: [name], [name]. Date of Service: 03/02/2020 12:45 PM Medical Record Number: [id number] Patient Account Number: [id number] Date of Birth/Sex: 1946-05-12 (74 y.o. F) Treating RN: Cornell Barman Primary Care  Provider: [name] Other Clinician: Referring Provider: [name] Treating Provider/Extender: Tito Dine in Treatment: 64 Debridement Performed for Wound #6 Left,Lateral Lower Leg Assessment: Performed By: Physician [name] Dillon, MD Debridement Type: Debridement Severity of Tissue Pre Debridement: Fat layer exposed Level of Consciousness (Pre- Awake and Alert procedure): Pre-procedure Verification/Time Out Yes - 13:09 Taken: Start Time: 13:09 Pain Control: Lidocaine Total Area Debrided (L x W): 1.4 (cm) x 1.4 (cm) = 1.96 (cm) Tissue and other material Viable, Non-Viable, Slough, Subcutaneous, Fibrin/Exudate, Slough debrided: Level: Skin/Subcutaneous Tissue Debridement Description: Excisional Instrument: Curette Bleeding: Minimum Hemostasis Achieved: Pressure End Time: 13:11 Response to Treatment: Procedure was tolerated well Level of Consciousness (Post- Awake and Alert procedure): Post Debridement Measurements of Total Wound Length: (cm) 1.4 Width: (cm) 1.4 Depth: (cm) 0.2 Volume: (cm) 0.308 Character of Wound/Ulcer Post Debridement: Stable Severity of Tissue Post Debridement: Fat layer exposed Post Procedure Diagnosis Same as Pre-procedure Electronic Signature(s) Signed: 03/02/2020 4:22:42 PM By: Linton Ham MD Signed: 03/02/2020 4:39:52 PM By: [name], [name], RN, CWS, [name], [name] Entered By: Linton Ham on 03/02/2020 13:16:30 [name], [name] -------------------------------------------------------------------------------- HPI Details Patient Name: [name], [name]. Date of Service: 03/02/2020 12:45 PM Medical Record Number: [id number] Patient Account Number: [id number] Date of Birth/Sex: 1946/02/21 (74 y.o. F) Treating RN: Cornell Barman Primary Care Provider: [name] Other Clinician: Referring Provider: [name] Treating Provider/Extender: Tito Dine in Treatment: 33 History of  Present Illness HPI Description: Pleasant 74 year old with history of chronic venous insufficiency. No diabetes or peripheral vascular disease. Left ABI 1.29. Questionable history of left lower extremity DVT. She developed a recurrent ulceration on her left lateral calf in December 2015, which she attributes to poor diet and subsequent lower extremity edema. She underwent endovenous laser ablation of her left greater saphenous vein in 2010. She underwent laser ablation of accessory branch of left GSV in April 2016 by Dr. Kellie Simmering at [facility]. She was previously wearing Unna boots, which she tolerated well. Tolerating 2 layer compression and  cadexomer iodine. She returns to clinic for follow-up and is without new complaints. She denies any significant pain at this time. She reports persistent pain with pressure. No claudication or ischemic rest pain. No fever or chills. No drainage. READMISSION 11/13/16; this is a 74 year old woman who is not a diabetic. She is here for a review of a painful area on her left medial lower extremity. I note that she was seen here previously last year for wound I believe to be in the same area. At that time she had undergone previously a left greater saphenous vein ablation by Dr. Kellie Simmering and she had a ablation of the anterior accessory branch of the left greater saphenous vein in March 2016. Seeing that the wound actually closed over. In reviewing the history with her today the ulcer in this area has been recurrent. She describes a biopsy of this area in 2009 that only showed stasis physiology. 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 Do not see that she has ever had arterial studies in the left leg. She tells me she has a follow-up with Dr. Kellie Simmering in  roughly 10 days In any case she developed the reopening of this area roughly a month ago. On the background of this she describes rapidly increasing edema which has responded to Lasix 40 mg and metolazone 2.5 mg as well as the patient's lymph massage. She has been told she has both venous insufficiency and lymphedema but she cannot tolerate compression stockings 11/28/16; the patient saw Dr. Kellie Simmering recently. Per the patient he did arterial Dopplers in the office that did not show evidence of arterial insufficiency, per the patient he stated "treat this like an ordinary venous ulcer". She also saw her dermatologist Dr. Ronnald Ramp who felt that this was more of a vascular ulcer. In general things are improving although she arrives today with increasing bilateral lower extremity edema with weeping a deeper fluid through the wound on the left medial leg compatible with some degree of lymphedema 12/04/16; the patient's wound is fully epithelialized but I don't think fully healed. We will do another week of depression with Promogran and TCA however I suspect we'll be able to discharge her next week. This is a very unusual-looking wound which was initially a figure-of-eight type wound lying on its side surrounded by petechial like hemorrhage. She has had venous ablation on this side. She apparently does not have an arterial issue per Dr. Kellie Simmering. She saw her dermatologist thought it was "vascular". Patient is definitely going to need ongoing compression and I talked about this with her today she will go to elastic therapy after she leaves here next week 12/11/16; the patient's wound is not completely closed today. She has surrounding scar tissue and in further discussion with the patient it would appear that she had ulcers in this area in 2009 for a prolonged period of time ultimately requiring a punch biopsy of this area that only showed venous insufficiency. I did not previously pickup on this part of the history  from the patient. 12/18/16; the patient's wound is completely epithelialized. There is no open area here. She has significant bilateral venous insufficiency with secondary lymphedema to a mild-to-moderate degree she does not have compression stockings.. She did not say anything to me when  I was in the room, she told our intake nurse that she was still having pain in this area. This isn't unusual recurrent small open area. She is going to go to elastic therapy to obtain compression stockings. 12/25/16; the patient's wound is fully epithelialized. There is no open area here. The patient describes some continued episodic discomfort in this area medial left calf. However everything looks fine and healed here. She is been to elastic therapy and caught herself 15-20 mmHg stockings, they apparently were having trouble getting 20-30 mm stockings in her size 01/22/17; this is a patient we discharged from the clinic a month ago. She has a recurrent open wound on her medial left calf. She had 15 mm support stockings. I told her I thought she needed 20-30 mm compression stockings. She tells me that she has been ill with hospitalization secondary to asthma and is been found to have severe hypokalemia likely secondary to a combination of Lasix and metolazone. This morning she noted blistering and leaking fluid on the posterior part of her left leg. She called our intake nurse urgently and we was saw her this afternoon. She has not had any real discomfort here. I don't know that she's been wearing any stockings on this leg for at least 2-3 days. ABIs in this clinic were 1.21 on the right and 1.3 on the left. She is previously seen vascular surgery who does not think that there is a peripheral arterial issue. 01/30/17; Patient arrives with no open wound on the left leg. She has been to elastic therapy and obtained 20-17mmhg below knee stockings and she has one on the right leg today. READMISSION 02/19/18; this [name] is  a now 74 year old patient we've had in this clinic perhaps 3 times before. I had last looked at her from January 07 December 2016 with an area on the medial left leg. We discharged her on 12/25/16 however she had to be readmitted on 01/22/17 with a recurrence. I have in my notes that we discharged her on 20-30 mm stockings although she tells me she was only wearing support hose because she cannot get stockings on predominantly related to her cervical spine surgery/issues. She has had previous ablations done by vein and vascular in [place] including a great saphenous vein ablation on the left with an anterior accessory branch ablation I think both of these were in 2016. On one of the previous visit she had a biopsy noted 2009 that was negative. She is not felt to have an arterial issue. She is not a diabetic. She does have a history of obstructive sleep apnea hypertension asthma as well as chronic venous insufficiency and lymphedema. On this occasion she noted 2 dry scaly patch on her left leg. She tried to put lotion on this it didn't really help. There were 2 open areas.the patient has been seeing her primary physician from 02/05/18 through 02/14/18. She had Unna boots applied. The superior wound now on the lateral left leg has closed but she's had one wound that remains open on the lateral left leg. This is not the same spot as we dealt with in 2018. ABIs in this clinic were 1.3 bilaterally [name], [name] 02/26/18; patient has a small wound on the left lateral calf. Dimensions are down. She has chronic venous insufficiency and lymphedema. 03/05/18; small open area on the left lateral calf. Dimensions are down. Tightly adherent necrotic debris over the surface of the wound which was difficult to remove. Also the dressing [over  collagen] stuck to the wound surface. This was removed with some difficulty as well. Change the primary dressing to Hydrofera Blue ready 03/12/18; small open area on the  left lateral calf. Comes in with tightly adherent surface eschar as well as some adherent Hydrofera Blue. 03/19/18; open area on the left lateral calf. Again adherent surface eschar as well as some adherent Hydrofera Blue nonviable subcutaneous tissue. She complained of pain all week even with the reduction from 4-3 layer compression I put on last week. Also she had an increase in her ankle and calf measurements probably related to the same thing. 03/26/18; open area on the left lateral calf. A very small open area remains here. We used silver alginate starting last week as the Hydrofera Blue seem to stick to the wound bed. In using 4-layer compression 04/02/18; the open area in the left lateral calf at some adherent slough which I removed there is no open area here. We are able to transition her into her own compression stocking. Truthfully I think this is probably his support hose. However this does not maintain skin integrity will be limited. She cannot put over the toe compression stockings on because of neck problems hand problems etc. She is allergic to the lining layer of juxta lites. We might be forced to use extremitease stocking should this fail READMIT 11/24/2018 Patient is now a 74 year old woman who is not a diabetic. She has been in this clinic on at least 3 previous occasions largely with recurrent wounds on her left leg secondary to chronic venous insufficiency with secondary lymphedema. Her situation is complicated by inability to get stockings on and an allergy to neoprene which is apparently a component and at least juxta lites and other stockings. As a result she really has not been wearing any stockings on her legs. She tells [place] that roughly 2 or 3 weeks ago she started noticing a stinging sensation just above her ankle on the left medial aspect. She has been diagnosed with pseudogout and she wondered whether this was what she was experiencing. She tried to dress this with something  she bought at the store however subsequently it pulled skin off and now she has an open wound that is not improving. She has been using Vaseline gauze with a cover bandage. She saw her primary doctor last week who put an Haematologist on her. ABIs in this clinic was 1.03 on the left 2/12; the area is on the left medial ankle. Odd-looking wound with what looks to be surface epithelialization but a multitude of small petechial openings. This clearly not closed yet. We have been using silver alginate under 3 layer compression with TCA 2/19; the wound area did not look quite as good this week. Necrotic debris over the majority of the wound surface which required debridement. She continues to have a multitude of what looked to be small petechial openings. She reminds [place] that she had a biopsy on this initially during her first outbreak in 2015 in [place] dermatology. She expresses concern about this being a possible melanoma. 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 I assured her that this does not look anything like melanoma. Except for the petechial reaction it does look like a venous insufficiency area and she certainly has evidence of this on both sides 2/26; a difficult area on the left medial ankle. The patient clearly has chronic venous hypertension with some degree  of lymphedema. The odd thing about the area is the small petechial hemorrhages. I am not really sure how to explain this. This was present last time and this is not a compression injury. We have been using Hydrofera Blue which I changed to last week 3/4; still using Hydrofera Blue. Aggressive debridement today. She does not have known arterial issues. She has seen Dr. Kellie Simmering at [facility] vein and vascular and and has an ablation on the left. [Anterior accessory branch of the greater saphenous]. From what I remember they did not feel she had an  arterial issue. The patient has had this area biopsied in 2009 at [facility] dermatology and by her recollection they said this was "stasis". She is also follow-up with dermatology locally who thought that this was more of a vascular issue 3/11; using Hydrofera Blue. Aggressive debridement today. She does not have an arterial issue. We are using 3 layer compression although we may need to go to 4. The patient has been in for multiple changes to her wrap since I last saw her a week ago. She says that the area was leaking. I do not have too much more information on what was found 01/19/19 on evaluation today patient was actually being seen for a nurse visit when unfortunately she had the area on her left lateral lower extremity as well as weeping from the right lower extremity that became apparent. Therefore we did end up actually seeing her for a full visit with myself. She is having some pain at this site as well but fortunately nothing too significant at this point. No fevers, chills, nausea, or vomiting noted at this time. 3/18-Patient is back to the clinic with the left leg venous leg ulcer, the ulcer is larger in size, has a surface that is densely adherent with fibrinous tissue, the Hydrofera Blue was used but is densely adherent and there was difficulty in removing it. The right lower extremity was also wrapped for weeping edema. Patient has a new area over the left lateral foot above the malleolus that is small and appears to have no debris with intact surrounding skin. Patient is on increased dose of Lasix also as a means to edema management 3/25; the patient has a nonhealing venous ulcer on the medial left leg and last week developed a smaller area on the lateral left calf. We have been using Hydrofera Blue with a contact layer. 4/1; no major change in these wounds areas. Left medial and more recently left lateral calf. I tried Iodoflex last week to aid in debridement she did not tolerate this.  She stated her pain was terrible all week. She took the top layer of the 4 layer compression off. 4/8; the patient actually looks somewhat better in terms of her more prominent left lateral calf wound. There is some healthy looking tissue here. She is still complaining of a lot of discomfort. 4/15; patient in a lot of pain secondary to sciatica. She is on a prednisone taper prescribed by her primary physician. She has the 2 areas one on the left medial and more recently a smaller area on the left lateral calf. Both of these just above the malleoli 4/22; her back pain is better but she still states she is very uncomfortable and now feels she is intolerant to the [employer]. No real change in the wounds we have been using Sorbact. She has been previously intolerant to Iodoflex. There is not a lot of option about what we can use to  debride this wound under compression that she no doubt needs. sHe states Ultram no longer works for her pain 4/29; no major change in the wounds slightly increased depth. Surface on the original medial wound perhaps somewhat improved however the more recent area on the lateral left ankle is 100% covered in very adherent debris we have been using Sorbact. She tolerates 4 layer compression well and her edema control is a lot better. She has not had to come in for a nurse check 5/6; no major change in the condition of the wounds. She did consent to debridement today which was done with some difficulty. Continuing Sorbact. She did not tolerate Iodoflex. She was in for a check of her compression the day after we wrapped her last week this was adjusted but nothing much was found 5/13; no major change in the condition or area of the wounds. I was able to get a fairly aggressive debridement done on the lateral left leg wound. Even using Sorbact under compression. She came back in on Friday to have the wrap changed. She says she felt uncomfortable on the lateral aspect of her ankle.  She has a long history of chronic venous insufficiency including previous ablation surgery on this side. 5/20-Patient returns for wounds on left leg with both wounds covered in slough, with the lateral leg wound larger in size, she has been in 3 layer compression and felt more comfortable, she describes pain in ankle, in leg and pins and needles in foot, and is about to try Pamelor for this 6/3; wounds on the left lateral and left medial leg. The area medially which is the most recent of the 2 seems to have had the largest increase in [place], MIDORI SLACUM. ([id number]) dimensions. We have been using Sorbac to try and debride the surface. She has been to see orthopedics they apparently did a plain x-ray that was indeterminant. Diagnosed her with neuropathy and they have ordered an MRI to determine if there is underlying osteomyelitis. This was not high on my thought list but I suppose it is prudent. We have advised her to make an appointment with vein and vascular in [place]. She has a history of a left greater saphenous and accessory vein ablations I wonder if there is anything else that can be done from a surgical point of view to help in these difficult refractory wounds. We have previously healed this wound on one occasion but it keeps on reopening [medial side] 6/10; deep tissue culture I did last week I think on the left medial wound showed both moderate E. coli and moderate staph aureus [MSSA]. She is going to require antibiotics and I have chosen Augmentin. We have been using Sorbact and we have made better looking wound surface on both sides but certainly no improvement in wound area. She was back in last Friday apparently for a dressing changes the wrap was hurting her outer left ankle. She has not managed to get a hold of vein and vascular in [place]. We are going to have to make her that appointment 6/17; patient is tolerating the Augmentin. She had an MRI that I think was ordered by  orthopedic surgeon this did not show osteomyelitis or an abscess did suggest cellulitis. We have been using Sorbact to the lateral and medial ankles. We have been trying to arrange a follow-up appointment with vein and vascular in [place] or did her original ablations. We apparently an area sent the request to vein and vascular in [facility] 6/24;  patient has completed the Augmentin. We do not yet have a vein and vascular appointment in [place]. I am not sure what the issue is here we have asked her to call tomorrow. We are using Sorbact. Making some improvements and especially the medial wound. Both surfaces however look better medial and lateral. 7/1; the patient has been in contact with vein and vascular in [place] but has not yet received an appointment. Using Sorbact we have gradually improve the wound surface with no improvement in surface area. She is approved for Apligraf but the wound surface still is not completely viable. She has not had to come in for a dressing change 7/8; the patient has an appointment with vein and vascular on 7/31 which is a Friday afternoon. She is concerned about getting back here for [place] to dress her wounds. I think it is important to have them goal for her venous reflux/history of ablations etc. to see if anything else can be done. She apparently tested positive for 1 of the blood tests with regards to lupus and saw a rheumatologist. He has raised the issue of vasculitis again. I have had this thought in the past however the evidence seems overwhelming that this is a venous reflux etiology. If the rheumatologist tells me there is clinical and laboratory investigation is positive for lupus I will rethink this. 7/15; the patient's wound surfaces are quite a bit better. The medial area which was her original wound now has no depth although the lateral wound which was the more recent area actually appears larger. Both with viable surfaces which is indeed  better. Using Sorbact. I wanted to use Apligraf on her however there is the issue of the vein and vascular appointment on 7/31 at 2:00 in the afternoon which would not allow her to get back to be rewrapped and they would no doubt remove the graft 7/22; the patient's wound surfaces have moderate amount of debris although generally look better. The lateral one is larger with 2 small satellite areas superiorly. We are waiting for her vein and vascular appointment on 7/31. She has been approved for Apligraf which I would like to use after th 7/29; wound surfaces have improved no debridement is required we have been using Sorbact. She sees vein and vascular on Friday with this so question of whether anything can be done to lessen the likelihood of recurrence and/or speed the healing of these areas. She is already had previous ablations. She no doubt has severe venous hypertension 8/5-Patient returns at 1 week, she was in [place] for 3 days by her podiatrist, we have been using so backed to the wound, she has increased pain in both the wounds on the left lower leg especially the more distal one on the lateral aspect 8/12-Patient returns at 1 week and she is agreeable to having debridement in both wounds on her left leg today. We have been using Sorbact, and vascular studies were reviewed at last visit 8/19; the patient arrives with her wounds fairly clean and no debridement is required. We have used Sorbact which is really done a nice job in cleaning up these very difficult wound surfaces. The patient saw Dr. Donzetta Matters of vascular surgery on 7/31. He did not feel that there was an arterial component. He felt that her treated greater saphenous vein is adequately addressed and that the small saphenous vein did not appear to be involved significantly. She was also noted to have deep venous reflux which is not treatable. Dr.  Donzetta Matters mentioned the possibility of a central obstructive component leading to reflux and  he offered her central venography. She wanted to discuss this or think about it. I have urged her to go ahead with this. She has had recurrent difficult wounds in these areas which do heal but after months in the clinic. If there is anything that can be done to reduce the likelihood of this I think it is worth it. 9/2 she is still working towards getting follow-up with Dr. Donzetta Matters to schedule her CT. Things are quite a bit worse venography. I put Apligraf on 2 weeks ago on both wounds on the medial and lateral part of her left lower leg. She arrives in clinic today with 3 superficial additional wounds above the area laterally and one below the wound medially. She describes a lot of discomfort. I think these are probably wrapped injuries. Does not look like she has cellulitis. 07/20/2019 on evaluation today patient appears to be doing somewhat poorly in regard to her lower extremity ulcers. She in fact showed signs of erythema in fact we may even be dealing with an infection at this time. Unfortunately I am unsure if this is just infection or if indeed there may be some allergic reaction that occurred as a result of the Apligraf application. With that being said that would be unusual but nonetheless not impossible in this patient is one who is unfortunately allergic to quite a bit. Currently we have been using the Sorbact which seems to do as well as anything for her. I do think we may want to obtain a culture today to see if there is anything showing up there that may need to be addressed. 9/16; noted that last week the wounds look worse in 1 week follow-up of the Apligraf. Using Sorbact as of 2 days ago. She arrives with copious amounts of drainage and new skin breakdown on the back of the left calf. The wounds arm more substantial bilaterally. There is a fair amount of swelling in the left calf no overt DVT there is edema present I think in the left greater than right thigh. She is supposed to go on 9/28  for CT venography. The wounds on the medial and lateral calf are worse and she has new skin breakdown posteriorly at least new for me. This is almost developing into a circumferential wound area The Apligraf was taken off last week which I agree with things are not going in the right direction a culture was done we do not have that back yet. She is on Augmentin that she started 2 days ago 9/23; dressing was changed by her nurses on Monday. In general there is no improvement in the wound areas although the area looks less angry than last week. She did get Augmentin for MSSA cultured on the 14th. She still appears to have too much swelling in the left leg even with 3 layer compression 9/30; the patient underwent her procedure on 9/28 by Dr. Donzetta Matters at [facility]. She was discovered to have the common iliac vein measuring 12.2 mm but at the level of L4-L5 measured 3 mm. After stenting it measured 10 mm. 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 Rouleaux flow in the common femoral and femoral vein was observed much improved after stenting. We are using silver alginate to the wounds on the medial and lateral ankle on the left. 4 layer compression 10/7; the patient had fluid swelling around her knee and  4 layer compression. At the advice of vein and vascular this was reduced to 3 layer which she is tolerating better. We have been using silver alginate under 3 layer compression since last Friday 10/14; arrives with the areas on the left ankle looking a lot better. Inflammation in the area also a lot better. She came in for a nurse check on 10/9 10/21; continued nice improvement. Slight improvements in surface area of both the medial and lateral wounds on the left. A lot of the satellite lesions in the weeping erythema around these from stasis dermatitis is resolved. We have been using silver alginate BARBETTA, FERONE ([id number]) 10/28; general improvement in the entire  wound areas although not a lot of change in dimensions the wound certainly looks better. There is a lot less in terms of venous inflammation. Continue silver alginate this week however look towards Hydrofera Blue next week 11/4; very adherent debris on the medial wound left wound is not as bad. We have been using silver alginate. Change to [facility] today 11/11; very adherent debris on both wound areas. She went to vein and vascular last week and follow-up they put in Snowflake boot on this today. He says the [facility] was adherent. Wound is definitely not as good as last week. Especially on the left there the satellite lesions look more prominent 11/18; absolutely no better. erythema on lateral aspect with tenderness. 09/30/2019 on evaluation today patient appears to actually be doing better. Dr. Dellia Nims did put her on doxycycline last week which I do believe has helped her at this point. Fortunately there is no signs of active infection at this time. No fevers, chills, nausea, vomiting, or diarrhea. I do believe he may want extend the doxycycline for 7 additional days just to ensure everything does completely cleared up the patient is in agreement with that plan. Otherwise she is going require some sharp debridement today 12/2; patient is completing a 2-week course of doxycycline. I gave her this empirically for inflammation as well as infection when I last saw her 2 weeks ago. All of this seems to be better. She is using silver alginate she has the area on the medial aspect of the larger area laterally and the 2 small satellite regions laterally above the major wound. 12/9; the patient's wound on the left medial and left lateral calf look really quite good. We have been using silver alginate. She saw vein and vascular in follow-up on 10/09/2019. She has had a previous left greater saphenous vein ablation by Dr. Oscar La in 2016. More recently she underwent a left common iliac vein stent by Dr.  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 The swelling is improved and certainly the wounds have improved. The patient shows [place] today area on the right medial calf there is almost no wound but leaking lymphedema. She says she start this started 3 or 4 days ago. She did not traumatize it. It is not painful. She does not wear compression on that side 12/16; the patient continues to do well laterally. Medially still requiring debridement. The area on the right calf did not materialize to anything and is not currently open. We wrapped this last time. She has support stockings for that leg although I am not sure they are going to provide adequate compression 12/23; the lateral wound looks stable. Medially still requiring debridement for tightly adherent fibrinous debris. We've been using silver alginate. Surface area not any different 12/30; neither wound is any better  with regards to surface and the area on the left lateral is larger. I been using silver alginate to the left lateral which look quite good last week and Sorbact to the left medial 11/11/2019. Lateral wound area actually looks better and somewhat smaller. Medial still requires a very aggressive debridement today. We have been using Sorbact on both wound areas 1/13; not much better still adherent debris bilaterally. I been using Sorbact. She has severe venous hypertension. Probably some degree of dermal fibrosis distally. I wonder whether tighter compression might help and I am going to try that today. We also need to work on the bioburden 1/20; using Sorbact. She has severe venous hypertension status post stent placement for pelvic vein compression. We applied gentamicin last time to see if we could reduce bioburden I had some discussion with her today about the use of pentoxifylline. This is occasionally used in this setting for wounds with refractory venous insufficiency. However this interacts with Plavix. She tells me that she  was put on this after stent placement for 3 months. She will call Dr. Claretha Cooper office to discuss 1/27; we are using gentamicin under Sorbact. 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 She has a stent. Wound medially is measuring smaller this week. Laterally measuring slightly larger although she has some satellite lesions superiorly 2/3; gentamicin under Sorbact under 4-layer compression. 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 She has a stent on Plavix. Her wounds are measuring smaller this week. More substantially laterally where there is a satellite lesion superiorly. 2/10; gentamicin under Sorbac. 4-layer compression. Patient communicated with Dr. Donzetta Matters at vein and vascular in [place]. He is okay with the patient coming off Plavix I will therefore start her on pentoxifylline for a 1 month trial. In general her wounds look better today. I had some concerns about swelling in the left thigh however she measures 61.5 on the right and 63 on the mid thigh which does not suggest there is any difficulty. The patient is not describing any pain. 2/17; gentamicin under Sorbac 4-layer compression. She has been on pentoxifylline for 1 week and complains of loose stool. No nausea she is eating and drinking well 2/24; the patient apparently came in 2 days ago for a nurse visit when her wrap fell down. Both areas look a little worse this week macerated medially and satellite lesions laterally. Change to silver alginate today 3/3; wounds are larger today especially medially. She also has more swelling in her foot lower leg and I even noted some swelling in her posterior thigh which is tender. I wonder about the patency of her stent. Fortuitously she sees Dr. Claretha Cooper group on Friday 3/10; Mrs. Roylance was seen by vein and vascular on 3/5. The patient underwent ultrasound. There was no evidence of thrombosis involving the IVC no evidence of thrombosis involving  the right common iliac vein there is no evidence of thrombosis involving the right external iliac vein the left external vein is also patent. The right common iliac vein stent appears patent bilateral common femoral veins are compressible and appear patent. I was concerned about the left common iliac stent however it looks like this is functional. She has some edema in the posterior thigh that was tender she still has that this week. I also note they had trouble finding the pulses in her left foot and booked her for an ABI baseline in 4 weeks. She will follow up in 6 months for repeat IVC duplex. The patient  stopped the pentoxifylline because of diarrhea. It does not look like that was being effective in any case. I have advised her to go back on her aspirin 81 mg tablet, vascular it also suggested this 3/17; comes in today with her wound surfaces a lot better. The excoriations from last week considerably better probably secondary to the TCA. We have been using silver alginate 3/24; comes in today with smaller wounds both medially and laterally. Both required debridement. There are 2 small satellite areas superiorly laterally. She also has a very odd bandlike area in the mid calf almost looking like there was a weakness in the wrap in a localized area. I would write this off as being this however anteriorly she has a small raised ballotable area that is very tender almost reminiscent of an abscess but there was no obvious purulent surface to it. 02/04/20 upon evaluation today patient appears to be doing fairly well in regard to her wounds today. Fortunately there is no signs of active infection at this time. No fevers, chills, nausea, vomiting, or diarrhea. She has been tolerating the dressing changes without complication. Fortunately I feel like she is showing signs of improvement although has been sometime since have seen her. Nonetheless the area of concern that Dr. Dellia Nims had last week where she  had possibly an area of the wrap that was we can allow the leg to bulge appears to be doing significantly better today there is no signs of anything worsening. [name], JUNGERS ([id number]) 4/7; the patient's wounds on her medial and lateral left leg continue to contract. We have been using a regular alginate. Last week she developed an area on the right medial lower leg which is probably a venous ulcer as well. 4/14; the wounds on her left medial and lateral lower leg continue to contract. Surface eschar. We have been using regular alginate. The area on the right medial lower leg is closed. We have been putting both legs under 4-layer contraction. The patient went back to see vein and vascular she had arterial studies done which were apparently "quite good" per the patient although I have not read their notes I have never felt she had an arterial issue. 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 This is been longstanding and refractory to exercise, leg elevation and longstanding use of compression wraps in our clinic as well as compression stockings on the times we have been able to get these to heal 4/21; we thought she actually might be close this week however she arrives in clinic with a lot of edema in her upper left calf and into her posterior thigh. This is been an intermittent problem here. She says the wrap fell down but it was replaced with a nurse visit on Monday. We are using calcium alginate to the wounds and the wound sizes there not terribly larger than last week but there is a lot more edema 4/28; again wound edges are smaller on both sides. Her edema is better controlled than last time. She is obtained her compression pumps from [facility] although they have not been to her home to set these up. Electronic Signature(s) Signed: 03/02/2020 4:22:42 PM By: Linton Ham MD Entered By: Linton Ham on 03/02/2020 13:17:17 Darwin,  [name] -------------------------------------------------------------------------------- Physical Exam Details Patient Name: LEELA, CRACKEL. Date of Service: 03/02/2020 12:45 PM Medical Record Number: [id number] Patient Account Number: [id number] Date of Birth/Sex: [birth year] (74 y.o. F) Treating RN: Cornell Barman Primary Care  Provider: [name] Other Clinician: Referring Provider: [name] Treating Provider/Extender: Tito Dine in Treatment: 63 Constitutional Patient is hypertensive.. Pulse regular and within target range for patient.Marland Kitchen Respirations regular, non-labored and within target range.. Temperature is normal and within the target range for the patient.Marland Kitchen appears in no distress. Cardiovascular Pedal pulses are palpable on the left. Edema present in both extremities however it is under much better control than last week. Integumentary (Hair, Skin) No evidence of infection around the wound areas. Notes Wound exam; medial and left lateral both look a lot better. Some superficial skin and dried eschar Electronic Signature(s) Signed: 03/02/2020 4:22:42 PM By: Linton Ham MD Entered By: Linton Ham on 03/02/2020 13:18:29 Clover, [name] -------------------------------------------------------------------------------- Physician Orders Details Patient Name: [name], [name]. Date of Service: 03/02/2020 12:45 PM Medical Record Number: [id number] Patient Account Number: [id number] Date of Birth/Sex: Jun 25, 1946 (74 y.o. F) Treating RN: Cornell Barman Primary Care Provider: [name] Other Clinician: Referring Provider: [name] Treating Provider/Extender: Tito Dine in Treatment: 49 Verbal / Phone Orders: No Diagnosis Coding Anesthetic (add to Medication List) Wound #5 Left,Medial Lower Leg o Topical Lidocaine 4% cream applied to wound bed prior to debridement (In Clinic Only). Wound #6 Left,Lateral  Lower Leg o Topical Lidocaine 4% cream applied to wound bed prior to debridement (In Clinic Only). Skin Barriers/Peri-Wound Care Wound #5 Left,Medial Lower Leg o Triamcinolone Acetonide Ointment (TCA) - peri-wound Wound #6 Left,Lateral Lower Leg o Triamcinolone Acetonide Ointment (TCA) - peri-wound Primary Wound Dressing Wound #5 Left,Medial Lower Leg o Alginate Wound #6 Left,Lateral Lower Leg o Alginate Secondary Dressing Wound #5 Left,Medial Lower Leg o ABD pad Wound #6 Left,Lateral Lower Leg o ABD pad Dressing Change Frequency Wound #5 Left,Medial Lower Leg o Change dressing every week o Other: - Nurse visit Friday and Monday if needed. Wound #6 Left,Lateral Lower Leg o Change dressing every week o Other: - Nurse visit Friday and Monday if needed. Follow-up Appointments Wound #5 Left,Medial Lower Leg o Return Appointment in 1 week. o Nurse Visit as needed Wound #6 Left,Lateral Lower Leg o Return Appointment in 1 week. o Nurse Visit as needed Edema Control Wound #5 Left,Medial Lower Leg o 4 Layer Compression System - Bilateral o Compression Pump: Use compression pump on left lower extremity for 60 minutes, twice daily. o Compression Pump: Use compression pump on right lower extremity for 60 minutes, twice daily. Wound #6 Left,Lateral Lower Leg o 4 Layer Compression System - Bilateral o Compression Pump: Use compression pump on left lower extremity for 60 minutes, twice daily. FALESHA, HENRICK ([id number]) o Compression Pump: Use compression pump on right lower extremity for 60 minutes, twice daily. Electronic Signature(s) Signed: 03/02/2020 4:22:42 PM By: Linton Ham MD Signed: 03/02/2020 4:39:52 PM By: [name], [name], RN, CWS, [name], [name] Entered By: [name], [name], RN, CWS, Kim on 03/02/2020 13:14:35 [name], [name] -------------------------------------------------------------------------------- Problem List  Details Patient Name: [name], [name]. Date of Service: 03/02/2020 12:45 PM Medical Record Number: [id number] Patient Account Number: [id number] Date of Birth/Sex: [birth year] (74 y.o. F) Treating RN: Cornell Barman Primary Care Provider: [name] Other Clinician: Referring Provider: [name] Treating Provider/Extender: Tito Dine in Treatment: 64 Active Problems ICD-10 Encounter Code Description Active Date MDM Diagnosis L97.221 Non-pressure chronic ulcer of left calf limited to breakdown of skin 01/07/2019 No Yes I87.332 Chronic venous hypertension (idiopathic) with ulcer and inflammation of 12/09/2019 No Yes left lower extremity L97.211 Non-pressure chronic ulcer of right calf  limited to breakdown of skin 02/10/2020 No Yes I89.0 Lymphedema, not elsewhere classified 12/10/2018 No Yes I82.592 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 Date of Service: 03/02/2020 12:45 PM Medical Record Number: [id number] Patient Account Number: [id number] Date of Birth/Sex: [birth year] (74 y.o. F) Treating RN: Cornell Barman Primary Care Provider: [name] Other Clinician: Referring Provider: [name] Treating Provider/Extender: Tito Dine in Treatment: 35 Subjective History of Present Illness (HPI) Pleasant 74 year old with history of chronic venous insufficiency. No diabetes or peripheral vascular disease.  Left ABI 1.29. Questionable history of left lower extremity DVT. She developed a recurrent ulceration on her left lateral calf in December 2015, which she attributes to poor diet and subsequent lower extremity edema. She underwent endovenous laser ablation of her left greater saphenous vein in 2010. She underwent laser ablation of accessory branch of left GSV in April 2016 by Dr. Kellie Simmering at [facility]. She was previously wearing Unna boots, which she tolerated well. Tolerating 2 layer compression and cadexomer iodine. She returns to clinic for follow-up and is without new complaints. She denies any significant pain at this time. She reports persistent pain with pressure. No claudication or ischemic rest pain. No fever or chills. No drainage. READMISSION 11/13/16; this is a 74 year old woman who is not a diabetic. She is here for a review of a painful area on her left medial lower extremity. I note that she was seen here previously last year for wound I believe to be in the same area. At that time she had undergone previously a left greater saphenous vein ablation by Dr. Kellie Simmering and she had a ablation of the anterior accessory branch of the left greater saphenous vein in March 2016. Seeing that the wound actually closed over. In reviewing the history with her today the ulcer in this area has been recurrent. She describes a biopsy of this area in 2009 that only showed stasis physiology. 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 Do not see that she has ever had arterial studies in the left leg. She tells me she has a follow-up with Dr. Kellie Simmering in roughly 10 days In any case she developed the reopening of this area roughly a month ago. On the background of this she describes rapidly  increasing edema which has responded to Lasix 40 mg and metolazone 2.5 mg as well as the patient's lymph massage. She has been told she has both venous insufficiency and lymphedema but she cannot tolerate compression stockings 11/28/16; the patient saw Dr. Kellie Simmering recently. Per the patient he did arterial Dopplers in the office that did not show evidence of arterial insufficiency, per the patient he stated "treat this like an ordinary venous ulcer". She also saw her dermatologist Dr. Ronnald Ramp who felt that this was more of a vascular ulcer. In general things are improving although she arrives today with increasing bilateral lower extremity edema  with weeping a deeper fluid through the wound on the left medial leg compatible with some degree of lymphedema 12/04/16; the patient's wound is fully epithelialized but I don't think fully healed. We will do another week of depression with Promogran and TCA however I suspect we'll be able to discharge her next week. This is a very unusual-looking wound which was initially a figure-of-eight type wound lying on its side surrounded by petechial like hemorrhage. She has had venous ablation on this side. She apparently does not have an arterial issue per Dr. Kellie Simmering. She saw her dermatologist thought it was "vascular". Patient is definitely going to need ongoing compression and I talked about this with her today she will go to elastic therapy after she leaves here next week 12/11/16; the patient's wound is not completely closed today. She has surrounding scar tissue and in further discussion with the patient it would appear that she had ulcers in this area in 2009 for a prolonged period of time ultimately requiring a punch biopsy of this area that only showed venous insufficiency. I did not previously pickup on this part of the history from the patient. 12/18/16; the patient's wound is completely epithelialized. There is no open area here. She has significant bilateral  venous insufficiency with secondary lymphedema to a mild-to-moderate degree she does not have compression stockings.. She did not say anything to me when I was in the room, she told our intake nurse that she was still having pain in this area. This isn't unusual recurrent small open area. She is going to go to elastic therapy to obtain compression stockings. 12/25/16; the patient's wound is fully epithelialized. There is no open area here. The patient describes some continued episodic discomfort in this area medial left calf. However everything looks fine and healed here. She is been to elastic therapy and caught herself 15-20 mmHg stockings, they apparently were having trouble getting 20-30 mm stockings in her size 01/22/17; this is a patient we discharged from the clinic a month ago. She has a recurrent open wound on her medial left calf. She had 15 mm support stockings. I told her I thought she needed 20-30 mm compression stockings. She tells me that she has been ill with hospitalization secondary to asthma and is been found to have severe hypokalemia likely secondary to a combination of Lasix and metolazone. This morning she noted blistering and leaking fluid on the posterior part of her left leg. She called our intake nurse urgently and we was saw her this afternoon. She has not had any real discomfort here. I don't know that she's been wearing any stockings on this leg for at least 2-3 days. ABIs in this clinic were 1.21 on the right and 1.3 on the left. She is previously seen vascular surgery who does not think that there is a peripheral arterial issue. 01/30/17; Patient arrives with no open wound on the left leg. She has been to elastic therapy and obtained 20-30mmhg below knee stockings and she has one on the right leg today. READMISSION 02/19/18; this Maddux is a now 74 year old patient we've had in this clinic perhaps 3 times before. I had last looked at her from January 07 December 2016 with  an area on the medial left leg. We discharged her on 12/25/16 however she had to be readmitted on 01/22/17 with a recurrence. I have in my notes that we discharged her on 20-30 mm stockings although she tells me she was only wearing support hose because she  cannot get stockings on predominantly related to her cervical spine surgery/issues. She has had previous ablations done by vein and vascular in [place] including a great saphenous vein ablation on the left with an anterior accessory branch ablation I think both of these were in 2016. On one of the previous visit she had a biopsy noted 2009 that was negative. She is not felt to have an arterial issue. She is not a diabetic. She does have a history of obstructive sleep apnea hypertension asthma as well as chronic venous insufficiency and lymphedema. On this occasion she noted 2 dry scaly patch on her left leg. She tried to put lotion on this it didn't really help. There were 2 open areas.the patient has been seeing her primary physician from 02/05/18 through 02/14/18. She had Unna boots applied. The superior wound now on the lateral left leg has closed but she's had one wound that remains open on the lateral left leg. This is not the same spot as we dealt with in 2018. ABIs in this clinic were 1.3 bilaterally EESHA, IGLESIA ([id number]) 02/26/18; patient has a small wound on the left lateral calf. Dimensions are down. She has chronic venous insufficiency and lymphedema. 03/05/18; small open area on the left lateral calf. Dimensions are down. Tightly adherent necrotic debris over the surface of the wound which was difficult to remove. Also the dressing [over collagen] stuck to the wound surface. This was removed with some difficulty as well. Change the primary dressing to Hydrofera Blue ready 03/12/18; small open area on the left lateral calf. Comes in with tightly adherent surface eschar as well as some adherent Hydrofera Blue. 03/19/18; open area on the  left lateral calf. Again adherent surface eschar as well as some adherent Hydrofera Blue nonviable subcutaneous tissue. She complained of pain all week even with the reduction from 4-3 layer compression I put on last week. Also she had an increase in her ankle and calf measurements probably related to the same thing. 03/26/18; open area on the left lateral calf. A very small open area remains here. We used silver alginate starting last week as the Hydrofera Blue seem to stick to the wound bed. In using 4-layer compression 04/02/18; the open area in the left lateral calf at some adherent slough which I removed there is no open area here. We are able to transition her into her own compression stocking. Truthfully I think this is probably his support hose. However this does not maintain skin integrity will be limited. She cannot put over the toe compression stockings on because of neck problems hand problems etc. She is allergic to the lining layer of juxta lites. We might be forced to use extremitease stocking should this fail READMIT 11/24/2018 Patient is now a 74 year old woman who is not a diabetic. She has been in this clinic on at least 3 previous occasions largely with recurrent wounds on her left leg secondary to chronic venous insufficiency with secondary lymphedema. Her situation is complicated by inability to get stockings on and an allergy to neoprene which is apparently a component and at least juxta lites and other stockings. As a result she really has not been wearing any stockings on her legs. She tells [place] that roughly 2 or 3 weeks ago she started noticing a stinging sensation just above her ankle on the left medial aspect. She has been diagnosed with pseudogout and she wondered whether this was what she was experiencing. She tried to dress this with something she  bought at the store however subsequently it pulled skin off and now she has an open wound that is not improving. She has been  using Vaseline gauze with a cover bandage. She saw her primary doctor last week who put an Haematologist on her. ABIs in this clinic was 1.03 on the left 2/12; the area is on the left medial ankle. Odd-looking wound with what looks to be surface epithelialization but a multitude of small petechial openings. This clearly not closed yet. We have been using silver alginate under 3 layer compression with TCA 2/19; the wound area did not look quite as good this week. Necrotic debris over the majority of the wound surface which required debridement. She continues to have a multitude of what looked to be small petechial openings. She reminds [place] that she had a biopsy on this initially during her first outbreak in 2015 in [place] dermat[facility]. She expresses concern about this being a possible melanoma. 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 I assured her that this does not look anything like melanoma. Except for the petechial reaction it does look like a venous insufficiency area and she certainly has evidence of this on both sides 2/26; a difficult area on the left medial ankle. The patient clearly has chronic venous hypertension with some degree of lymphedema. The odd thing about the area is the small petechial hemorrhages. I am not really sure how to explain this. This was present last time and this is not a compression injury. We have been using Hydrofera Blue which I changed to last week 3/4; still using Hydrofera Blue. Aggressive debridement today. She does not have known arterial issues. She has seen Dr. Kellie Simmering at [facility] vein [facility] vascular and and has an ablation on the left. [Anterior accessory branch of the greater saphenous]. From what I remember they did not feel she had an arterial issue. The patient has had this area biopsied in 2009 at [facility] dermatology and by her recollection they said this  was "stasis". She is also follow-up with dermatology locally who thought that this was more of a vascular issue 3/11; using Hydrofera Blue. Aggressive debridement today. She does not have an arterial issue. We are using 3 layer compression although we may need to go to 4. The patient has been in for multiple changes to her wrap since I last saw her a week ago. She says that the area was leaking. I do not have too much more information on what was found 01/19/19 on evaluation today patient was actually being seen for a nurse visit when unfortunately she had the area on her left lateral lower extremity as well as weeping from the right lower extremity that became apparent. Therefore we did end up actually seeing her for a full visit with myself. She is having some pain at this site as well but fortunately nothing too significant at this point. No fevers, chills, nausea, or vomiting noted at this time. 3/18-Patient is back to the clinic with the left leg venous leg ulcer, the ulcer is larger in size, has a surface that is densely adherent with fibrinous tissue, the Hydrofera Blue was used but is densely adherent and there was difficulty in removing it. The right lower extremity was also wrapped for weeping edema. Patient has a new area over the left lateral foot above the malleolus that is small and appears to have no debris with intact  surrounding skin. Patient is on increased dose of Lasix also as a means to edema management 3/25; the patient has a nonhealing venous ulcer on the medial left leg and last week developed a smaller area on the lateral left calf. We have been using Hydrofera Blue with a contact layer. 4/1; no major change in these wounds areas. Left medial and more recently left lateral calf. I tried Iodoflex last week to aid in debridement she did not tolerate this. She stated her pain was terrible all week. She took the top layer of the 4 layer compression off. 4/8; the patient actually  looks somewhat better in terms of her more prominent left lateral calf wound. There is some healthy looking tissue here. She is still complaining of a lot of discomfort. 4/15; patient in a lot of pain secondary to sciatica. She is on a prednisone taper prescribed by her primary physician. She has the 2 areas one on the left medial and more recently a smaller area on the left lateral calf. Both of these just above the malleoli 4/22; her back pain is better but she still states she is very uncomfortable and now feels she is intolerant to the [employer]. No real change in the wounds we have been using Sorbact. She has been previously intolerant to Iodoflex. There is not a lot of option about what we can use to debride this wound under compression that she no doubt needs. sHe states Ultram no longer works for her pain 4/29; no major change in the wounds slightly increased depth. Surface on the original medial wound perhaps somewhat improved however the more recent area on the lateral left ankle is 100% covered in very adherent debris we have been using Sorbact. She tolerates 4 layer compression well and her edema control is a lot better. She has not had to come in for a nurse check 5/6; no major change in the condition of the wounds. She did consent to debridement today which was done with some difficulty. Continuing Sorbact. She did not tolerate Iodoflex. She was in for a check of her compression the day after we wrapped her last week this was adjusted but nothing much was found 5/13; no major change in the condition or area of the wounds. I was able to get a fairly aggressive debridement done on the lateral left leg wound. Even using Sorbact under compression. She came back in on Friday to have the wrap changed. She says she felt uncomfortable on the lateral aspect of her ankle. She has a long history of chronic venous insufficiency including previous ablation surgery on this side. 5/20-Patient returns  for wounds on left leg with both wounds covered in slough, with the lateral leg wound larger in size, she has been in 3 layer compression and felt more comfortable, she describes pain in ankle, in leg and pins and needles in foot, and is about to try Pamelor for this 6/3; wounds on the left lateral and left medial leg. The area medially which is the most recent of the 2 seems to have had the largest increase in [place], CHRISANNA HINKSON. ([id number]) dimensions. We have been using Sorbac to try and debride the surface. She has been to see orthopedics they apparently did a plain x-ray that was indeterminant. Diagnosed her with neuropathy and they have ordered an MRI to determine if there is underlying osteomyelitis. This was not high on my thought list but I suppose it is prudent. We have advised her to make  an appointment with vein and vascular in [place]. She has a history of a left greater saphenous and accessory vein ablations I wonder if there is anything else that can be done from a surgical point of view to help in these difficult refractory wounds. We have previously healed this wound on one occasion but it keeps on reopening [medial side] 6/10; deep tissue culture I did last week I think on the left medial wound showed both moderate E. coli and moderate staph aureus [MSSA]. She is going to require antibiotics and I have chosen Augmentin. We have been using Sorbact and we have made better looking wound surface on both sides but certainly no improvement in wound area. She was back in last Friday apparently for a dressing changes the wrap was hurting her outer left ankle. She has not managed to get a hold of vein and vascular in [place]. We are going to have to make her that appointment 6/17; patient is tolerating the Augmentin. She had an MRI that I think was ordered by orthopedic surgeon this did not show osteomyelitis or an abscess did suggest cellulitis. We have been using Sorbact to the lateral  and medial ankles. We have been trying to arrange a follow-up appointment with vein and vascular in [place] or did her original ablations. We apparently an area sent the request to vein and vascular in [facility] 6/24; patient has completed the Augmentin. We do not yet have a vein and vascular appointment in [place]. I am not sure what the issue is here we have asked her to call tomorrow. We are using Sorbact. Making some improvements and especially the medial wound. Both surfaces however look better medial and lateral. 7/1; the patient has been in contact with vein and vascular in [place] but has not yet received an appointment. Using Sorbact we have gradually improve the wound surface with no improvement in surface area. She is approved for Apligraf but the wound surface still is not completely viable. She has not had to come in for a dressing change 7/8; the patient has an appointment with vein and vascular on 7/31 which is a Friday afternoon. She is concerned about getting back here for [place] to dress her wounds. I think it is important to have them goal for her venous reflux/history of ablations etc. to see if anything else can be done. She apparently tested positive for 1 of the blood tests with regards to lupus and saw a rheumatologist. He has raised the issue of vasculitis again. I have had this thought in the past however the evidence seems overwhelming that this is a venous reflux etiology. If the rheumatologist tells me there is clinical and laboratory investigation is positive for lupus I will rethink this. 7/15; the patient's wound surfaces are quite a bit better. The medial area which was her original wound now has no depth although the lateral wound which was the more recent area actually appears larger. Both with viable surfaces which is indeed better. Using Sorbact. I wanted to use Apligraf on her however there is the issue of the vein and vascular appointment on 7/31 at 2:00  in the afternoon which would not allow her to get back to be rewrapped and they would no doubt remove the graft 7/22; the patient's wound surfaces have moderate amount of debris although generally look better. The lateral one is larger with 2 small satellite areas superiorly. We are waiting for her vein and vascular appointment on 7/31. She has been approved  for Apligraf which I would like to use after th 7/29; wound surfaces have improved no debridement is required we have been using Sorbact. She sees vein and vascular on Friday with this so question of whether anything can be done to lessen the likelihood of recurrence and/or speed the healing of these areas. She is already had previous ablations. She no doubt has severe venous hypertension 8/5-Patient returns at 1 week, she was in [place] for 3 days by her podiatrist, we have been using so backed to the wound, she has increased pain in both the wounds on the left lower leg especially the more distal one on the lateral aspect 8/12-Patient returns at 1 week and she is agreeable to having debridement in both wounds on her left leg today. We have been using Sorbact, and vascular studies were reviewed at last visit 8/19; the patient arrives with her wounds fairly clean and no debridement is required. We have used Sorbact which is really done a nice job in cleaning up these very difficult wound surfaces. The patient saw Dr. Donzetta Matters of vascular surgery on 7/31. He did not feel that there was an arterial component. He felt that her treated greater saphenous vein is adequately addressed and that the small saphenous vein did not appear to be involved significantly. She was also noted to have deep venous reflux which is not treatable. Dr. Donzetta Matters mentioned the possibility of a central obstructive component leading to reflux and he offered her central venography. She wanted to discuss this or think about it. I have urged her to go ahead with this. She has had  recurrent difficult wounds in these areas which do heal but after months in the clinic. If there is anything that can be done to reduce the likelihood of this I think it is worth it. 9/2 she is still working towards getting follow-up with Dr. Donzetta Matters to schedule her CT. Things are quite a bit worse venography. I put Apligraf on 2 weeks ago on both wounds on the medial and lateral part of her left lower leg. She arrives in clinic today with 3 superficial additional wounds above the area laterally and one below the wound medially. She describes a lot of discomfort. I think these are probably wrapped injuries. Does not look like she has cellulitis. 07/20/2019 on evaluation today patient appears to be doing somewhat poorly in regard to her lower extremity ulcers. She in fact showed signs of erythema in fact we may even be dealing with an infection at this time. Unfortunately I am unsure if this is just infection or if indeed there may be some allergic reaction that occurred as a result of the Apligraf application. With that being said that would be unusual but nonetheless not impossible in this patient is one who is unfortunately allergic to quite a bit. Currently we have been using the Sorbact which seems to do as well as anything for her. I do think we may want to obtain a culture today to see if there is anything showing up there that may need to be addressed. 9/16; noted that last week the wounds look worse in 1 week follow-up of the Apligraf. Using Sorbact as of 2 days ago. She arrives with copious amounts of drainage and new skin breakdown on the back of the left calf. The wounds arm more substantial bilaterally. There is a fair amount of swelling in the left calf no overt DVT there is edema present I think in the left greater  than right thigh. She is supposed to go on 9/28 for CT venography. The wounds on the medial and lateral calf are worse and she has new skin breakdown posteriorly at least new for  me. This is almost developing into a circumferential wound area The Apligraf was taken off last week which I agree with things are not going in the right direction a culture was done we do not have that back yet. She is on Augmentin that she started 2 days ago 9/23; dressing was changed by her nurses on Monday. In general there is no improvement in the wound areas although the area looks less angry than last week. She did get Augmentin for MSSA cultured on the 14th. She still appears to have too much swelling in the left leg even with 3 layer compression 9/30; the patient underwent her procedure on 9/28 by Dr. Donzetta Matters at vascular and vein specialist. She was discovered to have the common iliac vein measuring 12.2 mm but at the level of L4-L5 measured 3 mm. After stenting it measured 10 mm. 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 Rouleaux flow in the common femoral and femoral vein was observed much improved after stenting. We are using silver alginate to the wounds on the medial and lateral ankle on the left. 4 layer compression 10/7; the patient had fluid swelling around her knee and 4 layer compression. At the advice of vein and vascular this was reduced to 3 layer which she is tolerating better. We have been using silver alginate under 3 layer compression since last Friday 10/14; arrives with the areas on the left ankle looking a lot better. Inflammation in the area also a lot better. She came in for a nurse check on 10/9 10/21; continued nice improvement. Slight improvements in surface area of both the medial and lateral wounds on the left. A lot of the satellite lesions in the weeping erythema around these from stasis dermatitis is resolved. We have been using silver alginate LIVELY, BEHAR ([id number]) 10/28; general improvement in the entire wound areas although not a lot of change in dimensions the wound certainly looks better. There is a lot less in terms of venous  inflammation. Continue silver alginate this week however look towards Hydrofera Blue next week 11/4; very adherent debris on the medial wound left wound is not as bad. We have been using silver alginate. Change to [facility] today 11/11; very adherent debris on both wound areas. She went to vein and vascular last week and follow-up they put in Leona boot on this today. He says the [facility] was adherent. Wound is definitely not as good as last week. Especially on the left there the satellite lesions look more prominent 11/18; absolutely no better. erythema on lateral aspect with tenderness. 09/30/2019 on evaluation today patient appears to actually be doing better. Dr. Dellia Nims did put her on doxycycline last week which I do believe has helped her at this point. Fortunately there is no signs of active infection at this time. No fevers, chills, nausea, vomiting, or diarrhea. I do believe he may want extend the doxycycline for 7 additional days just to ensure everything does completely cleared up the patient is in agreement with that plan. Otherwise she is going require some sharp debridement today 12/2; patient is completing a 2-week course of doxycycline. I gave her this empirically for inflammation as well as infection when I last saw her 2 weeks ago. All of this seems to be  better. She is using silver alginate she has the area on the medial aspect of the larger area laterally and the 2 small satellite regions laterally above the major wound. 12/9; the patient's wound on the left medial and left lateral calf look really quite good. We have been using silver alginate. She saw vein and vascular in follow-up on 10/09/2019. She has had a previous left greater saphenous vein ablation by Dr. Oscar La in 2016. More recently she underwent a left common iliac vein stent by Dr. 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 The swelling is improved and certainly the wounds have improved. The patient  shows [place] today area on the right medial calf there is almost no wound but leaking lymphedema. She says she start this started 3 or 4 days ago. She did not traumatize it. It is not painful. She does not wear compression on that side 12/16; the patient continues to do well laterally. Medially still requiring debridement. The area on the right calf did not materialize to anything and is not currently open. We wrapped this last time. She has support stockings for that leg although I am not sure they are going to provide adequate compression 12/23; the lateral wound looks stable. Medially still requiring debridement for tightly adherent fibrinous debris. We've been using silver alginate. Surface area not any different 12/30; neither wound is any better with regards to surface and the area on the left lateral is larger. I been using silver alginate to the left lateral which look quite good last week and Sorbact to the left medial 11/11/2019. Lateral wound area actually looks better and somewhat smaller. Medial still requires a very aggressive debridement today. We have been using Sorbact on both wound areas 1/13; not much better still adherent debris bilaterally. I been using Sorbact. She has severe venous hypertension. Probably some degree of dermal fibrosis distally. I wonder whether tighter compression might help and I am going to try that today. We also need to work on the bioburden 1/20; using Sorbact. She has severe venous hypertension status post stent placement for pelvic vein compression. We applied gentamicin last time to see if we could reduce bioburden I had some discussion with her today about the use of pentoxifylline. This is occasionally used in this setting for wounds with refractory venous insufficiency. However this interacts with Plavix. She tells me that she was put on this after stent placement for 3 months. She will call Dr. Claretha Cooper office to discuss 1/27; we are using gentamicin  under Sorbact. 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 She has a stent. Wound medially is measuring smaller this week. Laterally measuring slightly larger although she has some satellite lesions superiorly 2/3; gentamicin under Sorbact under 4-layer compression. 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 She has a stent on Plavix. Her wounds are measuring smaller this week. More substantially laterally where there is a satellite lesion superiorly. 2/10; gentamicin under Sorbac. 4-layer compression. Patient communicated with Dr. Donzetta Matters at [facility] in [place]. He is okay with the patient coming off Plavix I will therefore start her on pentoxifylline for a 1 month trial. In general her wounds look better today. I had some concerns about swelling in the left thigh however she measures 61.5 on the right and 63 on the mid thigh which does not suggest there is any difficulty. The patient is not describing any pain. 2/17; gentamicin under Sorbac 4-layer compression. She has been on pentoxifylline for  1 week and complains of loose stool. No nausea she is eating and drinking well 2/24; the patient apparently came in 2 days ago for a nurse visit when her wrap fell down. Both areas look a little worse this week macerated medially and satellite lesions laterally. Change to silver alginate today 3/3; wounds are larger today especially medially. She also has more swelling in her foot lower leg and I even noted some swelling in her posterior thigh which is tender. I wonder about the patency of her stent. Fortuitously she sees Dr. Claretha Cooper group on Friday 3/10; Mrs. Becton was seen by [facility] on 3/5. The patient underwent ultrasound. There was no evidence of thrombosis involving the IVC no evidence of thrombosis involving the right common iliac vein there is no evidence of thrombosis involving the right external iliac vein the left external vein  is also patent. The right common iliac vein stent appears patent bilateral common femoral veins are compressible and appear patent. I was concerned about the left common iliac stent however it looks like this is functional. She has some edema in the posterior thigh that was tender she still has that this week. I also note they had trouble finding the pulses in her left foot and booked her for an ABI baseline in 4 weeks. She will follow up in 6 months for repeat IVC duplex. The patient stopped the pentoxifylline because of diarrhea. It does not look like that was being effective in any case. I have advised her to go back on her aspirin 81 mg tablet, vascular it also suggested this 3/17; comes in today with her wound surfaces a lot better. The excoriations from last week considerably better probably secondary to the TCA. We have been using silver alginate 3/24; comes in today with smaller wounds both medially and laterally. Both required debridement. There are 2 small satellite areas superiorly laterally. She also has a very odd bandlike area in the mid calf almost looking like there was a weakness in the wrap in a localized area. I would write this off as being this however anteriorly she has a small raised ballotable area that is very tender almost reminiscent of an abscess but there was no obvious purulent surface to it. 02/04/20 upon evaluation today patient appears to be doing fairly well in regard to her wounds today. Fortunately there is no signs of active infection at this time. No fevers, chills, nausea, vomiting, or diarrhea. She has been tolerating the dressing changes without complication. Fortunately I feel like she is showing signs of improvement although has been sometime since have seen her. Nonetheless the area of concern that Dr. Dellia Nims had last week where she had possibly an area of the wrap that was we can allow the leg to bulge appears to be doing significantly better today there  is no signs of anything worsening. ASHONDA, PANDEY ([id number]) 4/7; the patient's wounds on her medial and lateral left leg continue to contract. We have been using a regular alginate. Last week she developed an area on the right medial lower leg which is probably a venous ulcer as well. 4/14; the wounds on her left medial and lateral lower leg continue to contract. Surface eschar. We have been using regular alginate. The area on the right medial lower leg is closed. We have been putting both legs under 4-layer contraction. The patient went back to see vein and vascular she had arterial studies done which were apparently "quite good" per the patient  although I have not read their notes I have never felt she had an arterial issue. 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 This is been longstanding and refractory to exercise, leg elevation and longstanding use of compression wraps in our clinic as well as compression stockings on the times we have been able to get these to heal 4/21; we thought she actually might be close this week however she arrives in clinic with a lot of edema in her upper left calf and into her posterior thigh. This is been an intermittent problem here. She says the wrap fell down but it was replaced with a nurse visit on Monday. We are using calcium alginate to the wounds and the wound sizes there not terribly larger than last week but there is a lot more edema 4/28; again wound edges are smaller on both sides. Her edema is better controlled than last time. She is obtained her compression pumps from [facility] although they have not been to her home to set these up. Objective Constitutional Patient is hypertensive.. Pulse regular and within target range for patient.Marland Kitchen Respirations regular, non-labored and within target range.. Temperature is normal and within the target range for the patient.Marland Kitchen appears in no distress. Vitals  Time Taken: 12:45 PM, Height: 63 in, Weight: 224.7 lbs, BMI: 39.8, Temperature: 98.2 F, Pulse: 79 bpm, Respiratory Rate: 18 breaths/min, Blood Pressure: 160/84 mmHg. Cardiovascular Pedal pulses are palpable on the left. Edema present in both extremities however it is under much better control than last week. General Notes: Wound exam; medial and left lateral both look a lot better. Some superficial skin and dried eschar Integumentary (Hair, Skin) No evidence of infection around the wound areas. Wound #5 status is Open. Original cause of wound was Gradually Appeared. The wound is located on the Left,Medial Lower Leg. The wound measures 0.3cm length x 0.3cm width x 0.1cm depth; 0.071cm^2 area and 0.007cm^3 volume. There is Fat Layer (Subcutaneous Tissue) Exposed exposed. There is a medium amount of serosanguineous drainage noted. There is large (67-100%) red granulation within the wound bed. There is a small (1-33%) amount of necrotic tissue within the wound bed including Adherent Slough. Wound #6 status is Open. Original cause of wound was Gradually Appeared. The wound is located on the Left,Lateral Lower Leg. The wound measures 1.4cm length x 1.4cm width x 0.1cm depth; 1.539cm^2 area and 0.154cm^3 volume. There is Fat Layer (Subcutaneous Tissue) Exposed exposed. There is a medium amount of serosanguineous drainage noted. There is large (67-100%) red granulation within the wound bed. There is a small (1-33%) amount of necrotic tissue within the wound bed including Adherent Slough. 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 There was a Excisional Skin/Subcutaneous Tissue Debridement with a total area of 0.09 sq cm performed by [name] Dillon, MD. With the following instrument(s): Curette to remove Viable and Non-Viable tissue/material. Material removed includes Subcutaneous Tissue, Slough, and Fibrin/Exudate. No specimens were taken. A time out was conducted at 13:09, prior to the start of the procedure. A Minimum amount of bleeding was controlled with  Pressure. The procedure was tolerated well. Post Debridement Measurements: 0.3cm length x 0.3cm width x 0.2cm depth; 0.014cm^3 volume. Character of Wound/Ulcer Post Debridement is stable. Post procedure Diagnosis Wound #5: Same as Pre-Procedure Wound #6 Pre-procedure diagnosis of Wound #6 is a Venous Leg Ulcer located on the Left,Lateral Lower Leg .Severity of Tissue Pre Debridement is: Fat layer exposed. There was a Excisional Skin/Subcutaneous Tissue Debridement with a total area of 1.96 sq cm performed by [name] Dillon, MD. With the following instrument(s): Curette to remove Viable and Non-Viable tissue/material. Material removed includes Subcutaneous Tissue, Slough, and Fibrin/Exudate after achieving pain control using Lidocaine. No specimens were taken. A time out was conducted at 13:09, prior to the start of the procedure. A Minimum amount of bleeding was controlled with Pressure. The procedure was tolerated well. Post Debridement Measurements: 1.4cm length x 1.4cm width x 0.2cm depth; 0.308cm^3 volume. Character of Wound/Ulcer Post Debridement is stable. Severity of Tissue Post Debridement is: Fat layer exposed. Post procedure Diagnosis Wound #6: Same as Pre-Procedure Plan Anesthetic (add to Medication List): Wound #5 Left,Medial Lower Leg: Topical Lidocaine 4% cream applied to wound bed prior to debridement (In Clinic Only). Wound #6  Left,Lateral Lower Leg: Topical Lidocaine 4% cream applied to wound bed prior to debridement (In Clinic Only). Skin Barriers/Peri-Wound Care: Wound #5 Left,Medial Lower Leg: Triamcinolone Acetonide Ointment (TCA) - peri-wound Wound #6 Left,Lateral Lower Leg: Triamcinolone Acetonide Ointment (TCA) - peri-wound Primary Wound Dressing: Wound #5 Left,Medial Lower Leg: Alginate Wound #6 Left,Lateral Lower Leg: Alginate Secondary Dressing: Wound #5 Left,Medial Lower Leg: ABD pad Wound #6 Left,Lateral Lower Leg: ABD pad Dressing Change Frequency: Wound #5 Left,Medial Lower Leg: Change dressing every week Other: - Nurse visit Friday and Monday if needed. Wound #6 Left,Lateral Lower Leg: Change dressing every week Other: - Nurse visit Friday and Monday if needed. Follow-up Appointments: Wound #5 Left,Medial Lower Leg: Return Appointment in 1 week. Nurse Visit as needed Wound #6 Left,Lateral Lower Leg: Return Appointment in 1 week. Nurse Visit as needed Edema Control: Wound #5 Left,Medial Lower Leg: 4 Layer Compression System - Bilateral Compression Pump: Use compression pump on left lower extremity for 60 minutes, twice daily. Compression Pump: Use compression pump on right lower extremity for 60 minutes, twice daily. Wound #6 Left,Lateral Lower Leg: 4 Layer Compression System - Bilateral Compression Pump: Use compression pump on left lower extremity for 60 minutes, twice daily. Compression Pump: Use compression pump on right lower extremity for 60 minutes, twice daily. 1. Continue with calcium alginate/ABDs under 4-layer compression 2. I told her that she is welcome to start using the compression pumps over our wrap as soon as she is instructed to do so by [facility] and shown how to set this up. RADIE, KALLAS ([id number]) Electronic Signature(s) Signed: 03/02/2020 4:22:42 PM By: Linton Ham MD Entered By: Linton Ham on 03/02/2020 13:20:07 NDIDI, RAMASWAMY  ([id number]) -------------------------------------------------------------------------------- SuperBill Details Patient Name: TENNILLE, SANCHEZ. Date of Service: 03/02/2020 Medical Record Number: [id number] Patient Account Number: [id number] Date of Birth/Sex: Mar 07, 1946 (74 y.o. F) Treating RN: Cornell Barman Primary Care Provider: [name] Other Clinician: Referring Provider: [name] Treating Provider/Extender: Tito Dine in Treatment: 64 Diagnosis Coding ICD-10 Codes Code Description [id number] Non-pressure chronic ulcer of left calf limited to breakdown of skin I87.332 Chronic venous hypertension (idiopathic) with ulcer and inflammation of left lower extremity L97.211 Non-pressure chronic ulcer of right calf limited to breakdown of skin I89.0 Lymphedema, not elsewhere classified I82.592 Chronic embolism and thrombosis  of other specified deep vein of left lower extremity Facility Procedures CPT4 Code Description: [id number] - DEB SUBQ TISSUE 20 SQ CM/< Modifier: Quantity: 1 CPT4 Code Description: ICD-10 Diagnosis Description L97.221 Non-pressure chronic ulcer of left calf limited to breakdown of skin I87.332 Chronic venous hypertension (idiopathic) with ulcer and inflammation of l Modifier: eft lower extremity Quantity: Physician Procedures CPT4 Code Description: [id number] 11042 - WC PHYS SUBQ TISS 20 SQ CM Modifier: Quantity: 1 CPT4 Code Description: ICD-10 Diagnosis Description L97.221 Non-pressure chronic ulcer of left calf limited to breakdown of skin I87.332 Chronic venous hypertension (idiopathic) with ulcer and inflammation of l Modifier: eft lower extremity Quantity: Electronic Signature(s) Signed: 03/02/2020 4:22:42 PM By: Linton Ham MD Entered By: Linton Ham on 03/02/2020 13:20:33

## 2020-03-07 ENCOUNTER — Encounter: Payer: Medicare Other | Attending: Physician Assistant

## 2020-03-07 ENCOUNTER — Other Ambulatory Visit: Payer: Self-pay

## 2020-03-07 DIAGNOSIS — L97221 Non-pressure chronic ulcer of left calf limited to breakdown of skin: Secondary | ICD-10-CM | POA: Diagnosis not present

## 2020-03-07 DIAGNOSIS — I87332 Chronic venous hypertension (idiopathic) with ulcer and inflammation of left lower extremity: Secondary | ICD-10-CM | POA: Insufficient documentation

## 2020-03-07 DIAGNOSIS — I890 Lymphedema, not elsewhere classified: Secondary | ICD-10-CM | POA: Insufficient documentation

## 2020-03-07 NOTE — Progress Notes (Signed)
Amber, [name] ([id number]) Visit Report for 03/07/2020 Arrival Information Details Patient Name: [name], [name]. Date of Service: 03/07/2020 12:30 PM Medical Record Number: [id number] Patient Account Number: [id number] Date of Birth/Sex: 1946/06/28 (74 y.o. F) Treating RN: Montey Hora Primary Care Raymund Manrique: [name] Other Clinician: Referring Lakeesha Fontanilla: [name] Treating Ayomide Purdy/Extender: Melburn Hake, HOYT Weeks in Treatment: 60 Visit Information History Since Last Visit Added or deleted any medications: No Patient Arrived: Ambulatory Any new allergies or adverse reactions: No Arrival Time: 12:37 Had a fall or experienced change in No Accompanied By: self activities of daily living that may affect Transfer Assistance: None risk of falls: Patient Identification Verified: Yes Signs or symptoms of abuse/neglect since last visito No Secondary Verification Process Completed: Yes Hospitalized since last visit: No Patient Requires Transmission-Based No Implantable device outside of the clinic excluding No Precautions: cellular tissue based products placed in the center Patient Has Alerts: Yes since last visit: Patient Alerts: Patient on Blood Has Dressing in Place as Prescribed: Yes Thinner Has Compression in Place as Prescribed: Yes aspirin 81 Pain Present Now: No Notes 98.8 Electronic Signature(s) Signed: 03/07/2020 4:26:56 PM By: Montey Hora Entered By: Montey Hora on 03/07/2020 12:38:21 Huntsberry, [name] -------------------------------------------------------------------------------- Compression Therapy Details Patient Name: Amber, [name]. Date of Service: 03/07/2020 12:30 PM Medical Record Number: [id number] Patient Account Number: [id number] Date of Birth/Sex: 10-09-1946 (74 y.o. F) Treating RN: Montey Hora Primary Care Ester Mabe: [name] Other Clinician: Referring Dary Dilauro: [name] Treating Davionte Lusby/Extender: STONE  III, HOYT Weeks in Treatment: 64 Compression Therapy Performed for Wound Assessment: Wound #5 Left,Medial Lower Leg Performed By: Clinician Montey Hora, RN Compression Type: Four Layer Electronic Signature(s) Signed: 03/07/2020 4:26:56 PM By: Montey Hora Entered By: Montey Hora on 03/07/2020 12:39:49 Hilley, [name] -------------------------------------------------------------------------------- Compression Therapy Details Patient Name: Amber, [name]. Date of Service: 03/07/2020 12:30 PM Medical Record Number: [id number] Patient Account Number: [id number] Date of Birth/Sex: [birth year] (74 y.o. F) Treating RN: Montey Hora Primary Care Remona Boom: [name] Other Clinician: Referring Deiona Hooper: [name] Treating Daril Warga/Extender: STONE III, HOYT Weeks in Treatment: 64 Compression Therapy Performed for Wound Assessment: Wound #6 Left,Lateral Lower Leg Performed By: Clinician Montey Hora, RN Compression Type: Four Layer Electronic Signature(s) Signed: 03/07/2020 4:26:56 PM By: Montey Hora Entered By: Montey Hora on 03/07/2020 12:39:49 Stcharles, [name] -------------------------------------------------------------------------------- Encounter Discharge Information Details Patient Name: Amber, [name]. Date of Service: 03/07/2020 12:30 PM Medical Record Number: [id number] Patient Account Number: [id number] Date of Birth/Sex: [birth year] (74 y.o. F) Treating RN: Montey Hora Primary Care Gerome Kokesh: [name] Other Clinician: Referring [name] Towle: [name] Treating Gradie Butrick/Extender: Melburn Hake, HOYT Weeks in Treatment: 67 Encounter Discharge Information Items Discharge Condition: Stable Ambulatory Status: Ambulatory Discharge Destination: Home Transportation: Private Auto Accompanied By: self Schedule Follow-up Appointment: No Clinical Summary of Care: Electronic Signature(s) Signed: 03/07/2020 4:26:56 PM By: Montey Hora Entered By: Montey Hora on 03/07/2020 12:40:34 Monreal, [name] -------------------------------------------------------------------------------- Wound Assessment Details Patient Name: Amber, [name]. Date of Service: 03/07/2020 12:30 PM Medical Record Number: [id number] Patient Account Number: [id number] Date of Birth/Sex: [birth year] (74 y.o. F) Treating RN: Montey Hora Primary Care Mavin Dyke: [name] Other Clinician: Referring Tushar Enns: [name] Treating Ayodele Hartsock/Extender: STONE III, HOYT Weeks in Treatment: 64 Wound Status Wound Number: 5 Primary Lymphedema Etiology: Wound Location: Left, Medial Lower Leg Wound Open Wounding Event: Gradually Appeared Status: Date Acquired: 11/19/2018 Comorbid Cataracts, Asthma, Sleep Apnea, Deep Vein Thrombosis, Weeks Of Treatment: 64 History: Hypertension, Peripheral Venous Disease, Osteoarthritis, Clustered  Wound: No Received Chemotherapy, Received Radiation Wound Measurements Length: (cm) 0.3 Width: (cm) 0.3 Depth: (cm) 0.1 Area: (cm) 0.071 Volume: (cm) 0.007 % Reduction in Area: 99.2% % Reduction in Volume: 99.2% Epithelialization: None Tunneling: No Undermining: No Wound Description Classification: Full Thickness Without Exposed Support Structu Exudate Amount: Medium Exudate Type: Serosanguineous Exudate Color: red, brown res Foul Odor After Cleansing: No Slough/Fibrino Yes Wound Bed Granulation Amount: Large (67-100%) Exposed Structure Granulation Quality: Red Fascia Exposed: No Necrotic Amount: Small (1-33%) Fat Layer (Subcutaneous Tissue) Exposed: Yes Necrotic Quality: Adherent Slough Tendon Exposed: No Muscle Exposed: No Joint Exposed: No Bone Exposed: No Treatment Notes Wound #5 (Left, Medial Lower Leg) Notes Bilateral: alginate, ABD, 4 layer TCA Electronic Signature(s) Signed: 03/07/2020 4:26:56 PM By: Montey Hora Entered By: Montey Hora on 03/07/2020  12:39:04 Dreisbach, [name] ([id number]) -------------------------------------------------------------------------------- Wound Assessment Details Patient Name: Amber, [name]. Date of Service: 03/07/2020 12:30 PM Medical Record Number: [id number] Patient Account Number: [id number] Date of Birth/Sex: [birth year] (74 y.o. F) Treating RN: Montey Hora Primary Care Zakhi Dupre: [name] Other Clinician: Referring Langston Summerfield: [name] Treating Caine Barfield/Extender: STONE III, HOYT Weeks in Treatment: 70 Wound Status Wound Number: 6 Primary Venous Leg Ulcer Etiology: Wound Location: Left, Lateral Lower Leg Wound Open Wounding Event: Gradually Appeared Status: Date Acquired: 01/19/2019 Comorbid Cataracts, Asthma, Sleep Apnea, Deep Vein Thrombosis, Weeks Of Treatment: 59 History: Hypertension, Peripheral Venous Disease, Osteoarthritis, Clustered Wound: No Received Chemotherapy, Received Radiation Wound Measurements Length: (cm) 1.4 Width: (cm) 1.4 Depth: (cm) 0.1 Area: (cm) 1.539 Volume: (cm) 0.154 % Reduction in Area: -599.5% % Reduction in Volume: -600% Epithelialization: None Tunneling: No Undermining: No Wound Description Classification: Full Thickness Without Exposed Support Structu Exudate Amount: Medium Exudate Type: Serosanguineous Exudate Color: red, brown res Foul Odor After Cleansing: No Slough/Fibrino Yes Wound Bed Granulation Amount: Large (67-100%) Exposed Structure Granulation Quality: Red Fascia Exposed: No Necrotic Amount: Small (1-33%) Fat Layer (Subcutaneous Tissue) Exposed: Yes Necrotic Quality: Adherent Slough Tendon Exposed: No Muscle Exposed: No Joint Exposed: No Bone Exposed: No Treatment Notes Wound #6 (Left, Lateral Lower Leg) Notes Bilateral: alginate, ABD, 4 layer TCA Electronic Signature(s) Signed: 03/07/2020 4:26:56 PM By: Montey Hora Entered By: Montey Hora on 03/07/2020 12:39:23

## 2020-03-09 ENCOUNTER — Encounter: Payer: Medicare Other | Admitting: Internal Medicine

## 2020-03-09 ENCOUNTER — Other Ambulatory Visit: Payer: Self-pay

## 2020-03-09 ENCOUNTER — Other Ambulatory Visit: Payer: Self-pay | Admitting: Family Medicine

## 2020-03-09 DIAGNOSIS — I890 Lymphedema, not elsewhere classified: Secondary | ICD-10-CM | POA: Diagnosis not present

## 2020-03-09 DIAGNOSIS — L97221 Non-pressure chronic ulcer of left calf limited to breakdown of skin: Secondary | ICD-10-CM | POA: Diagnosis not present

## 2020-03-09 DIAGNOSIS — L97211 Non-pressure chronic ulcer of right calf limited to breakdown of skin: Secondary | ICD-10-CM | POA: Diagnosis not present

## 2020-03-09 DIAGNOSIS — I87332 Chronic venous hypertension (idiopathic) with ulcer and inflammation of left lower extremity: Secondary | ICD-10-CM | POA: Diagnosis not present

## 2020-03-09 DIAGNOSIS — I82502 Chronic embolism and thrombosis of unspecified deep veins of left lower extremity: Secondary | ICD-10-CM | POA: Diagnosis not present

## 2020-03-09 NOTE — Telephone Encounter (Signed)
Meloxicam Last filled:  02/08/20, #30 Last OV:  01/06/20, acute diarrhea Next OV:  04/12/20, 6 mo f/u

## 2020-03-10 NOTE — Progress Notes (Signed)
Amber, [name] ([id number]) Visit Report for 03/09/2020 Arrival Information Details Patient Name: [name], [name]. Date of Service: 03/09/2020 12:45 PM Medical Record Number: [id number] Patient Account Number: [id number] Date of Birth/Sex: 03/10/1946 (74 y.o. F) Treating RN: Cornell Barman Primary Care Aymara Sassi: [name] Other Clinician: Referring Elky Funches: [name] Treating Janith Nielson/Extender: Tito Dine in Treatment: 61 Visit Information History Since Last Visit Added or deleted any medications: No Patient Arrived: Ambulatory Any new allergies or adverse reactions: No Arrival Time: 12:43 Had a fall or experienced change in No Accompanied By: self activities of daily living that may affect Transfer Assistance: None risk of falls: Patient Identification Verified: Yes Signs or symptoms of abuse/neglect since last visito No Secondary Verification Process Completed: Yes Hospitalized since last visit: No Patient Requires Transmission-Based No Implantable device outside of the clinic excluding No Precautions: cellular tissue based products placed in the center Patient Has Alerts: Yes since last visit: Patient Alerts: Patient on Blood Has Dressing in Place as Prescribed: Yes Thinner Has Compression in Place as Prescribed: Yes aspirin 81 Pain Present Now: No Electronic Signature(s) Signed: 03/09/2020 4:20:30 PM By: Lorine Bears RCP, RRT, CHT Entered By: Lorine Bears on 03/09/2020 12:44:36 Taflinger, [name] -------------------------------------------------------------------------------- Compression Therapy Details Patient Name: Amber, [name]. Date of Service: 03/09/2020 12:45 PM Medical Record Number: [id number] Patient Account Number: [id number] Date of Birth/Sex: 26-Sep-1946 (74 y.o. F) Treating RN: Cornell Barman Primary Care Jerni Selmer: [name] Other Clinician: Referring Arnell Slivinski: [name] Treating  Margues Filippini/Extender: Tito Dine in Treatment: 65 Compression Therapy Performed for Wound Assessment: Wound #5 Left,Medial Lower Leg Performed By: Clinician Cornell Barman, RN Compression Type: Four Layer Pre Treatment ABI: 1 Post Procedure Diagnosis Same as Pre-procedure Electronic Signature(s) Signed: 03/09/2020 5:22:43 PM By: [name], [name], RN, CWS, [name], [name] Entered By: [name], [name], RN, CWS, Kim on 03/09/2020 13:06:04 [name] -------------------------------------------------------------------------------- Compression Therapy Details Patient Name: Amber, [name]. Date of Service: 03/09/2020 12:45 PM Medical Record Number: [id number] Patient Account Number: [id number] Date of Birth/Sex: 07/16/1946 (74 y.o. F) Treating RN: Cornell Barman Primary Care Nakisha Chai: [name] Other Clinician: Referring Jaclynne Baldo: [name] Treating Laurel Harnden/Extender: Tito Dine in Treatment: 65 Compression Therapy Performed for Wound Assessment: Wound #6 Left,Lateral Lower Leg Performed By: Clinician Cornell Barman, RN Compression Type: Four Layer Pre Treatment ABI: 1 Post Procedure Diagnosis Same as Pre-procedure Electronic Signature(s) Signed: 03/09/2020 5:22:43 PM By: [name], [name], RN, CWS, [name], [name] Entered By: [name], [name], RN, CWS, Kim on 03/09/2020 13:06:04 [name] -------------------------------------------------------------------------------- Compression Therapy Details Patient Name: Amber, [name]. Date of Service: 03/09/2020 12:45 PM Medical Record Number: [id number] Patient Account Number: [id number] Date of Birth/Sex: Nov 01, 1946 (74 y.o. F) Treating RN: Cornell Barman Primary Care Tania Steinhauser: [name] Other Clinician: Referring Everton Bertha: [name] Treating Jaishawn Witzke/Extender: Tito Dine in Treatment: 65 Compression Therapy Performed for Wound Assessment: NonWound Condition Lymphedema - Right Leg Performed  By: Clinician Cornell Barman, RN Compression Type: Four Layer Post Procedure Diagnosis Same as Pre-procedure Electronic Signature(s) Signed: 03/09/2020 5:22:43 PM By: [name], [name], RN, CWS, [name], [name] Entered By: [name], [name], RN, CWS, Kim on 03/09/2020 13:06:25 [name] -------------------------------------------------------------------------------- Encounter Discharge Information Details Patient Name: Amber, [name]. Date of Service: 03/09/2020 12:45 PM Medical Record Number: [id number] Patient Account Number: [id number] Date of Birth/Sex: 1946-04-02 (74 y.o. F) Treating RN: Cornell Barman Primary Care Jamella Grayer: [name] Other Clinician: Referring Cardale Dorer: [name] Treating Carleena Mires/Extender: Linton Ham  G Weeks in Treatment: 56 Encounter Discharge Information Items Discharge Condition: Stable Ambulatory Status: Ambulatory Discharge Destination: Home Transportation: Private Auto Accompanied By: self Schedule Follow-up Appointment: Yes Clinical Summary of Care: Electronic Signature(s) Signed: 03/09/2020 5:22:43 PM By: [name], [name], RN, CWS, [name], [name] Entered By: [name], [name], RN, CWS, Kim on 03/09/2020 13:07:59 [name] -------------------------------------------------------------------------------- Lower Extremity Assessment Details Patient Name: Amber, [name]. Date of Service: 03/09/2020 12:45 PM Medical Record Number: [id number] Patient Account Number: [id number] Date of Birth/Sex: [name] 31, 1946 (74 y.o. F) Treating RN: Montey Hora Primary Care Dawnita Molner: [name] Other Clinician: Referring Rivka Baune: [name] Treating Fabiha Rougeau/Extender: Tito Dine in Treatment: 65 Edema Assessment Assessed: [Left: No] [Right: No] Edema: [Left: Yes] [Right: Yes] Calf Left: Right: Point of Measurement: 33 cm From Medial Instep 45 cm 42.5 cm Ankle Left: Right: Point of Measurement: 10 cm From Medial Instep 22 cm 22  cm Vascular Assessment Pulses: Dorsalis Pedis Palpable: [Left:Yes] [Right:Yes] Electronic Signature(s) Signed: 03/09/2020 4:27:51 PM By: Montey Hora Entered By: Montey Hora on 03/09/2020 12:50:43 Eslinger, [name] -------------------------------------------------------------------------------- Multi Wound Chart Details Patient Name: ROZLIN, TREZISE. Date of Service: 03/09/2020 12:45 PM Medical Record Number: [id number] Patient Account Number: [id number] Date of Birth/Sex: 1946/02/27 (74 y.o. F) Treating RN: Cornell Barman Primary Care Nazia Rhines: [name] Other Clinician: Referring Shameika Speelman: [name] Treating Jarae Nemmers/Extender: Tito Dine in Treatment: 65 Vital Signs Height(in): 33 Pulse(bpm): 52 Weight(lbs): 224.7 Blood Pressure(mmHg): 136/68 Body Mass Index(BMI): 40 Temperature(F): 98.8 Respiratory Rate(breaths/min): 18 Photos: [N/A:N/A] Wound Location: Left, Medial Lower Leg Left, Lateral Lower Leg N/A Wounding Event: Gradually Appeared Gradually Appeared N/A Primary Etiology: Lymphedema Venous Leg Ulcer N/A Comorbid History: Cataracts, Asthma, Sleep Apnea, Cataracts, Asthma, Sleep Apnea, N/A Deep Vein Thrombosis, Deep Vein Thrombosis, Hypertension, Peripheral Venous Hypertension, Peripheral Venous Disease, Osteoarthritis, Received Disease, Osteoarthritis, Received Chemotherapy, Received Radiation Chemotherapy, Received Radiation Date Acquired: 11/19/2018 01/19/2019 N/A Weeks of Treatment: 65 59 N/A Wound Status: Open Open N/A Measurements L x W x D (cm) 1.2x1.6x0.1 1.9x1.7x0.1 N/A Area (cm) : 1.508 2.537 N/A Volume (cm) : 0.151 0.254 N/A % Reduction in Area: 82.40% -1053.20% N/A % Reduction in Volume: 82.30% -1054.50% N/A Classification: Full Thickness Without Exposed Full Thickness Without Exposed N/A Support Structures Support Structures Exudate Amount: Medium Medium N/A Exudate Type: Serosanguineous Serosanguineous  N/A Exudate Color: red, brown red, brown N/A Granulation Amount: Large (67-100%) Large (67-100%) N/A Granulation Quality: Red Red N/A Necrotic Amount: Small (1-33%) Small (1-33%) N/A Exposed Structures: Fat Layer (Subcutaneous Tissue) Fat Layer (Subcutaneous Tissue) N/A Exposed: Yes Exposed: Yes Fascia: No Fascia: No Tendon: No Tendon: No Muscle: No Muscle: No Joint: No Joint: No Bone: No Bone: No Epithelialization: None None N/A Procedures Performed: Compression Therapy Compression Therapy N/A Treatment Notes Wound #5 (Left, Medial Lower Leg) Notes Bilateral: alginate, ABD, 4 layer TCA Cinque, Amyriah J. ([id number]) Wound #6 (Left, Lateral Lower Leg) Notes Bilateral: alginate, ABD, 4 layer TCA Electronic Signature(s) Signed: 03/09/2020 4:28:02 PM By: Linton Ham MD Entered By: Linton Ham on 03/09/2020 13:12:03 [name] -------------------------------------------------------------------------------- Multi-Disciplinary Care Plan Details Patient Name: JYNESIS, MALTESE. Date of Service: 03/09/2020 12:45 PM Medical Record Number: [id number] Patient Account Number: [id number] Date of Birth/Sex: 02/17/1946 (74 y.o. F) Treating RN: Cornell Barman Primary Care Adahlia Stembridge: [name] Other Clinician: Referring Antonious Omahoney: [name] Treating Janai Brannigan/Extender: Tito Dine in Treatment: 2 Active Inactive Medication Nursing Diagnoses: Knowledge deficit related to medication safety: actual or potential Goals: Patient/caregiver will demonstrate understanding of  new oral/IV medications prescribed at [facility] (topical prescriptions are covered under the skin breakdown problem) Date Initiated: 12/16/2019 Target Resolution Date: 01/13/2020 Goal Status: Active Interventions: Assess for medication contraindications each visit where new medications are prescribed Treatment Activities: New medication prescribed at [facility] :  12/16/2019 Notes: Soft Tissue Infection Nursing Diagnoses: Impaired tissue integrity Goals: Patient's soft tissue infection will resolve Date Initiated: 12/10/2018 Target Resolution Date: 01/09/2019 Goal Status: Active Interventions: Assess signs and symptoms of infection every visit Notes: Venous Leg Ulcer Nursing Diagnoses: Actual venous Insuffiency (use after diagnosis is confirmed) Goals: Patient will maintain optimal edema control Date Initiated: 12/10/2018 Target Resolution Date: 01/09/2019 Goal Status: Active Interventions: Assess peripheral edema status every visit. Treatment Activities: Therapeutic compression applied : 12/10/2018 Notes: Wound/Skin Impairment Nursing Diagnoses: CASUNDRA, WATERFORD ([id number]) Impaired tissue integrity Goals: Patient/caregiver will verbalize understanding of skin care regimen Date Initiated: 12/10/2018 Target Resolution Date: 01/09/2019 Goal Status: Active Interventions: Assess ulceration(s) every visit Treatment Activities: Topical wound management initiated : 12/10/2018 Notes: Electronic Signature(s) Signed: 03/09/2020 5:22:43 PM By: [name], [name], RN, CWS, [name], [name] Entered By: [name], [name], RN, CWS, Kim on 03/09/2020 13:04:22 JANELI, PLUMER ([id number]) -------------------------------------------------------------------------------- Pain Assessment Details Patient Name: KARISS, RUPAR. Date of Service: 03/09/2020 12:45 PM Medical Record Number: [id number] Patient Account Number: [id number] Date of Birth/Sex: May 13, 1946 (74 y.o. F) Treating RN: Montey Hora Primary Care Alizeh Madril: [name] Other Clinician: Referring Laynee Lockamy: [name] Treating Haidar Muse/Extender: Tito Dine in Treatment: 65 Active Problems Location of Pain Severity and Description of Pain Patient Has Paino No Site Locations Pain Management and Medication Current Pain Management: Electronic Signature(s) Signed: 03/09/2020 4:27:51 PM By: Montey Hora Entered By: Montey Hora on 03/09/2020 12:48:06 Janak, [name] -------------------------------------------------------------------------------- Patient/Caregiver Education Details Patient Name: LASHELLE, LOWENSTEIN. Date of Service: 03/09/2020 12:45 PM Medical Record Number: [id number] Patient Account Number: [id number] Date of Birth/Gender: [birth year] (74 y.o. F) Treating RN: Cornell Barman Primary Care Physician: [name] Other Clinician: Referring Physician: [name] Treating Physician/Extender: Tito Dine in Treatment: 66 Education Assessment Education Provided To: Patient Education Topics Provided Venous: Handouts: Controlling Swelling with Multilayered Compression Wraps Methods: Demonstration, Explain/Verbal Responses: State content correctly Wound/Skin Impairment: Handouts: Caring for Your Ulcer Methods: Demonstration, Explain/Verbal Responses: State content correctly Electronic Signature(s) Signed: 03/09/2020 5:22:43 PM By: [name], [name], RN, CWS, [name], [name] Entered By: [name], [name], RN, CWS, Kim on 03/09/2020 13:07:17 [name] -------------------------------------------------------------------------------- Wound Assessment Details Patient Name: SALONI, HEDGEPETH. Date of Service: 03/09/2020 12:45 PM Medical Record Number: [id number] Patient Account Number: [id number] Date of Birth/Sex: [birth year] (74 y.o. F) Treating RN: Montey Hora Primary Care Milanya Sunderland: [name] Other Clinician: Referring Fredick Schlosser: [name] Treating Chasin Findling/Extender: Tito Dine in Treatment: 65 Wound Status Wound Number: 5 Primary Lymphedema Etiology: Wound Location: Left, Medial Lower Leg Wound Open Wounding Event: Gradually Appeared Status: Date Acquired: 11/19/2018 Comorbid Cataracts, Asthma, Sleep Apnea, Deep Vein Thrombosis, Weeks Of Treatment: 65 History: Hypertension, Peripheral Venous Disease,  Osteoarthritis, Clustered Wound: No Received Chemotherapy, Received Radiation Photos Wound Measurements Length: (cm) 1.2 Width: (cm) 1.6 Depth: (cm) 0.1 Area: (cm) 1.508 Volume: (cm) 0.151 % Reduction in Area: 82.4% % Reduction in Volume: 82.3% Epithelialization: None Tunneling: No Undermining: No Wound Description Classification: Full Thickness Without Exposed Support Struct Exudate Amount: Medium Exudate Type: Serosanguineous Exudate Color: red, brown ures Foul Odor After Cleansing: No Slough/Fibrino Yes Wound Bed Granulation Amount: Large (67-100%) Exposed Structure Granulation Quality: Red Fascia Exposed: No Necrotic  Amount: Small (1-33%) Fat Layer (Subcutaneous Tissue) Exposed: Yes Necrotic Quality: Adherent Slough Tendon Exposed: No Muscle Exposed: No Joint Exposed: No Bone Exposed: No Treatment Notes Wound #5 (Left, Medial Lower Leg) Notes Bilateral: alginate, ABD, 4 layer TCA Electronic Signature(s) Signed: 03/09/2020 4:27:51 PM By: Lambert Mody ([id number]) Entered By: Montey Hora on 03/09/2020 12:52:56 Nesheim, [name] -------------------------------------------------------------------------------- Wound Assessment Details Patient Name: DALAL, HATEM. Date of Service: 03/09/2020 12:45 PM Medical Record Number: [id number] Patient Account Number: [id number] Date of Birth/Sex: Jul 17, 1946 (74 y.o. F) Treating RN: Montey Hora Primary Care Janiesha Diehl: [name] Other Clinician: Referring Terrance Usery: [name] Treating Blossom Crume/Extender: Tito Dine in Treatment: 32 Wound Status Wound Number: 6 Primary Venous Leg Ulcer Etiology: Wound Location: Left, Lateral Lower Leg Wound Open Wounding Event: Gradually Appeared Status: Date Acquired: 01/19/2019 Comorbid Cataracts, Asthma, Sleep Apnea, Deep Vein Thrombosis, Weeks Of Treatment: 59 History: Hypertension, Peripheral Venous Disease,  Osteoarthritis, Clustered Wound: No Received Chemotherapy, Received Radiation Photos Wound Measurements Length: (cm) 1.9 Width: (cm) 1.7 Depth: (cm) 0.1 Area: (cm) 2.537 Volume: (cm) 0.254 % Reduction in Area: -1053.2% % Reduction in Volume: -1054.5% Epithelialization: None Tunneling: No Undermining: No Wound Description Classification: Full Thickness Without Exposed Support Struct Exudate Amount: Medium Exudate Type: Serosanguineous Exudate Color: red, brown ures Foul Odor After Cleansing: No Slough/Fibrino Yes Wound Bed Granulation Amount: Large (67-100%) Exposed Structure Granulation Quality: Red Fascia Exposed: No Necrotic Amount: Small (1-33%) Fat Layer (Subcutaneous Tissue) Exposed: Yes Necrotic Quality: Adherent Slough Tendon Exposed: No Muscle Exposed: No Joint Exposed: No Bone Exposed: No Treatment Notes Wound #6 (Left, Lateral Lower Leg) Notes Bilateral: alginate, ABD, 4 layer TCA Electronic Signature(s) Signed: 03/09/2020 4:27:51 PM By: Lambert Mody ([id number]) Entered By: Montey Hora on 03/09/2020 12:53:51 Peterkin, [name] -------------------------------------------------------------------------------- Vitals Details Patient Name: KEITY, CRESPI. Date of Service: 03/09/2020 12:45 PM Medical Record Number: [id number] Patient Account Number: [id number] Date of Birth/Sex: Jul 28, 1946 (74 y.o. F) Treating RN: Cornell Barman Primary Care Liticia Gasior: [name] Other Clinician: Referring Desarea Ohagan: [name] Treating Janayia Burggraf/Extender: Tito Dine in Treatment: 65 Vital Signs Time Taken: 12:45 Temperature (F): 98.8 Height (in): 63 Pulse (bpm): 83 Weight (lbs): 224.7 Respiratory Rate (breaths/min): 18 Body Mass Index (BMI): 39.8 Blood Pressure (mmHg): 136/68 Reference Range: 80 - 120 mg / dl Electronic Signature(s) Signed: 03/09/2020 4:20:30 PM By: Lorine Bears RCP, RRT, CHT Entered  By: Lorine Bears on 03/09/2020 12:47:23

## 2020-03-10 NOTE — Progress Notes (Signed)
Amber, [name] ([id number]) Visit Report for 03/09/2020 HPI Details Patient Name: Amber, [name]. Date of Service: 03/09/2020 12:45 PM Medical Record Number: [id number] Patient Account Number: [id number] Date of Birth/Sex: 17-[name]-1946 (74 y.o. F) Treating RN: Cornell Barman Primary Care Provider: [name] Other Clinician: Referring Provider: [name] Treating Provider/Extender: Tito Dine in Treatment: 61 History of Present Illness HPI Description: Pleasant 74 year old with history of chronic venous insufficiency. No diabetes or peripheral vascular disease. Left ABI 1.29. Questionable history of left lower extremity DVT. She developed a recurrent ulceration on her left lateral calf in December 2015, which she attributes to poor diet and subsequent lower extremity edema. She underwent endovenous laser ablation of her left greater saphenous vein in 2010. She underwent laser ablation of accessory branch of left GSV in April 2016 by Dr. Kellie Simmering at [facility]. She was previously wearing Unna boots, which she tolerated well. Tolerating 2 layer compression and cadexomer iodine. She returns to clinic for follow-up and is without new complaints. She denies any significant pain at this time. She reports persistent pain with pressure. No claudication or ischemic rest pain. No fever or chills. No drainage. READMISSION 11/13/16; this is a 74 year old woman who is not a diabetic. She is here for a review of a painful area on her left medial lower extremity. I note that she was seen here previously last year for wound I believe to be in the same area. At that time she had undergone previously a left greater saphenous vein ablation by Dr. Kellie Simmering and she had a ablation of the anterior accessory branch of the left greater saphenous vein in March 2016. Seeing that the wound actually closed over. In reviewing the history with her today the ulcer in this area has been recurrent. She describes  a biopsy of this area in 2009 that only showed stasis physiology. 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 Do not see that she has ever had arterial studies in the left leg. She tells me she has a follow-up with Dr. Kellie Simmering in roughly 10 days In any case she developed the reopening of this area roughly a month ago. On the background of this she describes rapidly increasing edema which has responded to Lasix 40 mg and metolazone 2.5 mg as well as the patient's lymph massage. She has been told she has both venous insufficiency and lymphedema but she cannot tolerate compression stockings 11/28/16; the patient saw Dr. Kellie Simmering recently. Per the patient he did arterial Dopplers in the office that did not show evidence of arterial insufficiency, per the patient he stated "treat this like an ordinary venous ulcer". She also saw her dermatologist Dr. Ronnald Ramp who felt that this was more of a vascular ulcer. In general things are improving although she arrives today with increasing bilateral lower extremity edema with weeping a deeper fluid through the wound on the left medial leg compatible with some degree of lymphedema 12/04/16; the patient's wound is fully epithelialized but I don't think fully healed. We will do another week of depression with Promogran and TCA however I suspect we'll be able to discharge her next week. This is a very unusual-looking wound which was initially a figure-of-eight type wound lying on its side surrounded by petechial like hemorrhage. She  has had venous ablation on this side. She apparently does not have an arterial issue per Dr. Kellie Simmering. She saw her dermatologist thought it was "vascular". Patient is definitely going to need ongoing compression and I talked about  this with her today she will go to elastic therapy after she leaves here next week 12/11/16; the patient's wound is not completely closed today. She has surrounding scar tissue and in further discussion with the patient it would appear that she had ulcers in this area in 2009 for a prolonged period of time ultimately requiring a punch biopsy of this area that only showed venous insufficiency. I did not previously pickup on this part of the history from the patient. 12/18/16; the patient's wound is completely epithelialized. There is no open area here. She has significant bilateral venous insufficiency with secondary lymphedema to a mild-to-moderate degree she does not have compression stockings.. She did not say anything to me when I was in the room, she told our intake nurse that she was still having pain in this area. This isn't unusual recurrent small open area. She is going to go to elastic therapy to obtain compression stockings. 12/25/16; the patient's wound is fully epithelialized. There is no open area here. The patient describes some continued episodic discomfort in this area medial left calf. However everything looks fine and healed here. She is been to elastic therapy and caught herself 15-20 mmHg stockings, they apparently were having trouble getting 20-30 mm stockings in her size 01/22/17; this is a patient we discharged from the clinic a month ago. She has a recurrent open wound on her medial left calf. She had 15 mm support stockings. I told her I thought she needed 20-30 mm compression stockings. She tells me that she has been ill with hospitalization secondary to asthma and is been found to have severe hypokalemia likely secondary to a combination of Lasix and metolazone. This morning she noted blistering and leaking fluid on the posterior part of her left leg. She called our intake nurse urgently and we was saw her this afternoon. She has not had any real discomfort here. I don't know  that she's been wearing any stockings on this leg for at least 2-3 days. ABIs in this clinic were 1.21 on the right and 1.3 on the left. She is previously seen vascular surgery who does not think that there is a peripheral arterial issue. 01/30/17; Patient arrives with no open wound on the left leg. She has been to elastic therapy and obtained 20-14mmhg below knee stockings and she has one on the right leg today. READMISSION 02/19/18; this Cirrincione is a now 74 year old patient we've had in this clinic perhaps 3 times before. I had last looked at her from January 07 December 2016 with an area on the medial left leg. We discharged her on 12/25/16 however she had to be readmitted on 01/22/17 with a recurrence. I have in my notes that we discharged her on 20-30 mm stockings although she tells me she was only wearing support hose because she cannot get stockings on predominantly related to her cervical spine surgery/issues. She has had previous ablations done by vein and vascular in [place] including a great saphenous vein ablation on the left with an anterior accessory branch ablation I think both of these were in 2016. On one of the previous visit she had a biopsy noted 2009 that was negative. She is not felt to have an arterial issue. She is not a diabetic. She does  have a history of obstructive sleep apnea hypertension asthma as well as chronic venous insufficiency and lymphedema. On this occasion she noted 2 dry scaly patch on her left leg. She tried to put lotion on this it didn't really help. There were 2 open areas.the JAMERIA, BRADWAY ([id number]) patient has been seeing her primary physician from 02/05/18 through 02/14/18. She had Unna boots applied. The superior wound now on the lateral left leg has closed but she's had one wound that remains open on the lateral left leg. This is not the same spot as we dealt with in 2018. ABIs in this clinic were 1.3 bilaterally 02/26/18; patient has a small wound on  the left lateral calf. Dimensions are down. She has chronic venous insufficiency and lymphedema. 03/05/18; small open area on the left lateral calf. Dimensions are down. Tightly adherent necrotic debris over the surface of the wound which was difficult to remove. Also the dressing [over collagen] stuck to the wound surface. This was removed with some difficulty as well. Change the primary dressing to Hydrofera Blue ready 03/12/18; small open area on the left lateral calf. Comes in with tightly adherent surface eschar as well as some adherent Hydrofera Blue. 03/19/18; open area on the left lateral calf. Again adherent surface eschar as well as some adherent Hydrofera Blue nonviable subcutaneous tissue. She complained of pain all week even with the reduction from 4-3 layer compression I put on last week. Also she had an increase in her ankle and calf measurements probably related to the same thing. 03/26/18; open area on the left lateral calf. A very small open area remains here. We used silver alginate starting last week as the Hydrofera Blue seem to stick to the wound bed. In using 4-layer compression 04/02/18; the open area in the left lateral calf at some adherent slough which I removed there is no open area here. We are able to transition her into her own compression stocking. Truthfully I think this is probably his support hose. However this does not maintain skin integrity will be limited. She cannot put over the toe compression stockings on because of neck problems hand problems etc. She is allergic to the lining layer of juxta lites. We might be forced to use extremitease stocking should this fail READMIT 11/24/2018 Patient is now a 74 year old woman who is not a diabetic. She has been in this clinic on at least 3 previous occasions largely with recurrent wounds on her left leg secondary to chronic venous insufficiency with secondary lymphedema. Her situation is complicated by inability to  get stockings on and an allergy to neoprene which is apparently a component and at least juxta lites and other stockings. As a result she really has not been wearing any stockings on her legs. She tells [place] that roughly 2 or 3 weeks ago she started noticing a stinging sensation just above her ankle on the left medial aspect. She has been diagnosed with pseudogout and she wondered whether this was what she was experiencing. She tried to dress this with something she bought at the store however subsequently it pulled skin off and now she has an open wound that is not improving. She has been using Vaseline gauze with a cover bandage. She saw her primary doctor last week who put an Haematologist on her. ABIs in this clinic was 1.03 on the left 2/12; the area is on the left medial ankle. Odd-looking wound with what looks to be surface epithelialization but a multitude of  small petechial openings. This clearly not closed yet. We have been using silver alginate under 3 layer compression with TCA 2/19; the wound area did not look quite as good this week. Necrotic debris over the majority of the wound surface which required debridement. She continues to have a multitude of what looked to be small petechial openings. She reminds [place] that she had a biopsy on this initially during her first outbreak in 2015 in [place] [facility]. She expresses concern about this being a possible melanoma. 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 I assured her that this does not look anything like melanoma. Except for the petechial reaction it does look like a venous insufficiency area and she certainly has evidence of this on both sides 2/26; a difficult area on the left medial ankle. The patient clearly has chronic venous hypertension with some degree of lymphedema. The odd thing about the area is the small petechial hemorrhages. I am not  really sure how to explain this. This was present last time and this is not a compression injury. We have been using Hydrofera Blue which I changed to last week 3/4; still using Hydrofera Blue. Aggressive debridement today. She does not have known arterial issues. She has seen Dr. Kellie Simmering at [facility] vein and vascular and and has an ablation on the left. [Anterior accessory branch of the greater saphenous]. From what I remember they did not feel she had an arterial issue. The patient has had this area biopsied in 2009 at [facility] dermatology and by her recollection they said this was "stasis". She is also follow-up with dermatology locally who thought that this was more of a vascular issue 3/11; using Hydrofera Blue. Aggressive debridement today. She does not have an arterial issue. We are using 3 layer compression although we may need to go to 4. The patient has been in for multiple changes to her wrap since I last saw her a week ago. She says that the area was leaking. I do not have too much more information on what was found 01/19/19 on evaluation today patient was actually being seen for a nurse visit when unfortunately she had the area on her left lateral lower extremity as well as weeping from the right lower extremity that became apparent. Therefore we did end up actually seeing her for a full visit with myself. She is having some pain at this site as well but fortunately nothing too significant at this point. No fevers, chills, nausea, or vomiting noted at this time. 3/18-Patient is back to the clinic with the left leg venous leg ulcer, the ulcer is larger in size, has a surface that is densely adherent with fibrinous tissue, the Hydrofera Blue was used but is densely adherent and there was difficulty in removing it. The right lower extremity was also wrapped for weeping edema. Patient has a new area over the left lateral foot above the malleolus that is small and appears to have no debris  with intact surrounding skin. Patient is on increased dose of Lasix also as a means to edema management 3/25; the patient has a nonhealing venous ulcer on the medial left leg and last week developed a smaller area on the lateral left calf. We have been using Hydrofera Blue with a contact layer. 4/1; no major change in these wounds areas. Left medial and more recently left lateral calf. I tried Iodoflex last week to aid in debridement  she did not tolerate this. She stated her pain was terrible all week. She took the top layer of the 4 layer compression off. 4/8; the patient actually looks somewhat better in terms of her more prominent left lateral calf wound. There is some healthy looking tissue here. She is still complaining of a lot of discomfort. 4/15; patient in a lot of pain secondary to sciatica. She is on a prednisone taper prescribed by her primary physician. She has the 2 areas one on the left medial and more recently a smaller area on the left lateral calf. Both of these just above the malleoli 4/22; her back pain is better but she still states she is very uncomfortable and now feels she is intolerant to the [employer]. No real change in the wounds we have been using Sorbact. She has been previously intolerant to Iodoflex. There is not a lot of option about what we can use to debride this wound under compression that she no doubt needs. sHe states Ultram no longer works for her pain 4/29; no major change in the wounds slightly increased depth. Surface on the original medial wound perhaps somewhat improved however the more recent area on the lateral left ankle is 100% covered in very adherent debris we have been using Sorbact. She tolerates 4 layer compression well and her edema control is a lot better. She has not had to come in for a nurse check 5/6; no major change in the condition of the wounds. She did consent to debridement today which was done with some difficulty. Continuing Sorbact.  She did not tolerate Iodoflex. She was in for a check of her compression the day after we wrapped her last week this was adjusted but nothing much was found 5/13; no major change in the condition or area of the wounds. I was able to get a fairly aggressive debridement done on the lateral left leg wound. Even using Sorbact under compression. She came back in on Friday to have the wrap changed. She says she felt uncomfortable on the Amber, [name]. ([id number]) lateral aspect of her ankle. She has a long history of chronic venous insufficiency including previous ablation surgery on this side. 5/20-Patient returns for wounds on left leg with both wounds covered in slough, with the lateral leg wound larger in size, she has been in 3 layer compression and felt more comfortable, she describes pain in ankle, in leg and pins and needles in foot, and is about to try Pamelor for this 6/3; wounds on the left lateral and left medial leg. The area medially which is the most recent of the 2 seems to have had the largest increase in dimensions. We have been using Sorbac to try and debride the surface. She has been to see orthopedics they apparently did a plain x-ray that was indeterminant. Diagnosed her with neuropathy and they have ordered an MRI to determine if there is underlying osteomyelitis. This was not high on my thought list but I suppose it is prudent. We have advised her to make an appointment with vein and vascular in [place]. She has a history of a left greater saphenous and accessory vein ablations I wonder if there is anything else that can be done from a surgical point of view to help in these difficult refractory wounds. We have previously healed this wound on one occasion but it keeps on reopening [medial side] 6/10; deep tissue culture I did last week I think on the left medial wound showed  both moderate E. coli and moderate staph aureus [MSSA]. She is going to require antibiotics and I have  chosen Augmentin. We have been using Sorbact and we have made better looking wound surface on both sides but certainly no improvement in wound area. She was back in last Friday apparently for a dressing changes the wrap was hurting her outer left ankle. She has not managed to get a hold of vein and vascular in [place]. We are going to have to make her that appointment 6/17; patient is tolerating the Augmentin. She had an MRI that I think was ordered by orthopedic surgeon this did not show osteomyelitis or an abscess did suggest cellulitis. We have been using Sorbact to the lateral and medial ankles. We have been trying to arrange a follow-up appointment with vein and vascular in [place] or did her original ablations. We apparently an area sent the request to vein and vascular in [facility] 6/24; patient has completed the Augmentin. We do not yet have a vein and vascular appointment in [place]. I am not sure what the issue is here we have asked her to call tomorrow. We are using Sorbact. Making some improvements and especially the medial wound. Both surfaces however look better medial and lateral. 7/1; the patient has been in contact with vein and vascular in [place] but has not yet received an appointment. Using Sorbact we have gradually improve the wound surface with no improvement in surface area. She is approved for Apligraf but the wound surface still is not completely viable. She has not had to come in for a dressing change 7/8; the patient has an appointment with vein and vascular on 7/31 which is a Friday afternoon. She is concerned about getting back here for [place] to dress her wounds. I think it is important to have them goal for her venous reflux/history of ablations etc. to see if anything else can be done. She apparently tested positive for 1 of the blood tests with regards to lupus and saw a rheumatologist. He has raised the issue of vasculitis again. I have had this thought in  the past however the evidence seems overwhelming that this is a venous reflux etiology. If the rheumatologist tells me there is clinical and laboratory investigation is positive for lupus I will rethink this. 7/15; the patient's wound surfaces are quite a bit better. The medial area which was her original wound now has no depth although the lateral wound which was the more recent area actually appears larger. Both with viable surfaces which is indeed better. Using Sorbact. I wanted to use Apligraf on her however there is the issue of the vein and vascular appointment on 7/31 at 2:00 in the afternoon which would not allow her to get back to be rewrapped and they would no doubt remove the graft 7/22; the patient's wound surfaces have moderate amount of debris although generally look better. The lateral one is larger with 2 small satellite areas superiorly. We are waiting for her vein and vascular appointment on 7/31. She has been approved for Apligraf which I would like to use after th 7/29; wound surfaces have improved no debridement is required we have been using Sorbact. She sees vein and vascular on Friday with this so question of whether anything can be done to lessen the likelihood of recurrence and/or speed the healing of these areas. She is already had previous ablations. She no doubt has severe venous hypertension 8/5-Patient returns at 1 week, she was in [employer]  for 3 days by her podiatrist, we have been using so backed to the wound, she has increased pain in both the wounds on the left lower leg especially the more distal one on the lateral aspect 8/12-Patient returns at 1 week and she is agreeable to having debridement in both wounds on her left leg today. We have been using Sorbact, and vascular studies were reviewed at last visit 8/19; the patient arrives with her wounds fairly clean and no debridement is required. We have used Sorbact which is really done a nice job in cleaning up  these very difficult wound surfaces. The patient saw Dr. Donzetta Matters of vascular surgery on 7/31. He did not feel that there was an arterial component. He felt that her treated greater saphenous vein is adequately addressed and that the small saphenous vein did not appear to be involved significantly. She was also noted to have deep venous reflux which is not treatable. Dr. Donzetta Matters mentioned the possibility of a central obstructive component leading to reflux and he offered her central venography. She wanted to discuss this or think about it. I have urged her to go ahead with this. She has had recurrent difficult wounds in these areas which do heal but after months in the clinic. If there is anything that can be done to reduce the likelihood of this I think it is worth it. 9/2 she is still working towards getting follow-up with Dr. Donzetta Matters to schedule her CT. Things are quite a bit worse venography. I put Apligraf on 2 weeks ago on both wounds on the medial and lateral part of her left lower leg. She arrives in clinic today with 3 superficial additional wounds above the area laterally and one below the wound medially. She describes a lot of discomfort. I think these are probably wrapped injuries. Does not look like she has cellulitis. 07/20/2019 on evaluation today patient appears to be doing somewhat poorly in regard to her lower extremity ulcers. She in fact showed signs of erythema in fact we may even be dealing with an infection at this time. Unfortunately I am unsure if this is just infection or if indeed there may be some allergic reaction that occurred as a result of the Apligraf application. With that being said that would be unusual but nonetheless not impossible in this patient is one who is unfortunately allergic to quite a bit. Currently we have been using the Sorbact which seems to do as well as anything for her. I do think we may want to obtain a culture today to see if there is anything showing up  there that may need to be addressed. 9/16; noted that last week the wounds look worse in 1 week follow-up of the Apligraf. Using Sorbact as of 2 days ago. She arrives with copious amounts of drainage and new skin breakdown on the back of the left calf. The wounds arm more substantial bilaterally. There is a fair amount of swelling in the left calf no overt DVT there is edema present I think in the left greater than right thigh. She is supposed to go on 9/28 for CT venography. The wounds on the medial and lateral calf are worse and she has new skin breakdown posteriorly at least new for me. This is almost developing into a circumferential wound area The Apligraf was taken off last week which I agree with things are not going in the right direction a culture was done we do not have that back yet. She is  on Augmentin that she started 2 days ago 9/23; dressing was changed by her nurses on Monday. In general there is no improvement in the wound areas although the area looks less angry than last week. She did get Augmentin for MSSA cultured on the 14th. She still appears to have too much swelling in the left leg even with 3 layer compression 9/30; the patient underwent her procedure on 9/28 by Dr. Donzetta Matters at [facility]. She was discovered to have the common iliac vein measuring 12.2 mm but at the level of L4-L5 measured 3 mm. After stenting it measured 10 mm. 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 Rouleaux flow in the common femoral and femoral vein was observed much improved after stenting. We are using silver alginate to the wounds on the medial and lateral ankle on the left. 4 layer compression 10/7; the patient had fluid swelling around her knee and 4 layer compression. At the advice of vein and vascular this was reduced to 3 layer which she is tolerating better. We have been using silver alginate under 3 layer compression since last Friday 10/14; arrives with the  areas on the left ankle looking a lot better. Inflammation in the area also a lot better. She came in for a nurse check on Amber, [name] ([id number]) 10/9 10/21; continued nice improvement. Slight improvements in surface area of both the medial and lateral wounds on the left. A lot of the satellite lesions in the weeping erythema around these from stasis dermatitis is resolved. We have been using silver alginate 10/28; general improvement in the entire wound areas although not a lot of change in dimensions the wound certainly looks better. There is a lot less in terms of venous inflammation. Continue silver alginate this week however look towards Hydrofera Blue next week 11/4; very adherent debris on the medial wound left wound is not as bad. We have been using silver alginate. Change to [facility] today 11/11; very adherent debris on both wound areas. She went to vein and vascular last week and follow-up they put in [place] boot on this today. He says the [facility] was adherent. Wound is definitely not as good as last week. Especially on the left there the satellite lesions look more prominent 11/18; absolutely no better. erythema on lateral aspect with tenderness. 09/30/2019 on evaluation today patient appears to actually be doing better. Dr. Dellia Nims did put her on doxycycline last week which I do believe has helped her at this point. Fortunately there is no signs of active infection at this time. No fevers, chills, nausea, vomiting, or diarrhea. I do believe he may want extend the doxycycline for 7 additional days just to ensure everything does completely cleared up the patient is in agreement with that plan. Otherwise she is going require some sharp debridement today 12/2; patient is completing a 2-week course of doxycycline. I gave her this empirically for inflammation as well as infection when I last saw her 2 weeks ago. All of this seems to be better. She is using silver alginate she  has the area on the medial aspect of the larger area laterally and the 2 small satellite regions laterally above the major wound. 12/9; the patient's wound on the left medial and left lateral calf look really quite good. We have been using silver alginate. She saw vein and vascular in follow-up on 10/09/2019. She has had a previous left greater saphenous vein ablation by Dr. Oscar La in 2016.  More recently she underwent a left common iliac vein stent by Dr. 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 The swelling is improved and certainly the wounds have improved. The patient shows [place] today area on the right medial calf there is almost no wound but leaking lymphedema. She says she start this started 3 or 4 days ago. She did not traumatize it. It is not painful. She does not wear compression on that side 12/16; the patient continues to do well laterally. Medially still requiring debridement. The area on the right calf did not materialize to anything and is not currently open. We wrapped this last time. She has support stockings for that leg although I am not sure they are going to provide adequate compression 12/23; the lateral wound looks stable. Medially still requiring debridement for tightly adherent fibrinous debris. We've been using silver alginate. Surface area not any different 12/30; neither wound is any better with regards to surface and the area on the left lateral is larger. I been using silver alginate to the left lateral which look quite good last week and Sorbact to the left medial 11/11/2019. Lateral wound area actually looks better and somewhat smaller. Medial still requires a very aggressive debridement today. We have been using Sorbact on both wound areas 1/13; not much better still adherent debris bilaterally. I been using Sorbact. She has severe venous hypertension. Probably some degree of dermal fibrosis distally. I wonder whether tighter compression might help and I am going  to try that today. We also need to work on the bioburden 1/20; using Sorbact. She has severe venous hypertension status post stent placement for pelvic vein compression. We applied gentamicin last time to see if we could reduce bioburden I had some discussion with her today about the use of pentoxifylline. This is occasionally used in this setting for wounds with refractory venous insufficiency. However this interacts with Plavix. She tells me that she was put on this after stent placement for 3 months. She will call Dr. Claretha Cooper office to discuss 1/27; we are using gentamicin under Sorbact. 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 She has a stent. Wound medially is measuring smaller this week. Laterally measuring slightly larger although she has some satellite lesions superiorly 2/3; gentamicin under Sorbact under 4-layer compression. 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 She has a stent on Plavix. Her wounds are measuring smaller this week. More substantially laterally where there is a satellite lesion superiorly. 2/10; gentamicin under Sorbac. 4-layer compression. Patient communicated with Dr. Donzetta Matters at vein and vascular in [place]. He is okay with the patient coming off Plavix I will therefore start her on pentoxifylline for a 1 month trial. In general her wounds look better today. I had some concerns about swelling in the left thigh however she measures 61.5 on the right and 63 on the mid thigh which does not suggest there is any difficulty. The patient is not describing any pain. 2/17; gentamicin under Sorbac 4-layer compression. She has been on pentoxifylline for 1 week and complains of loose stool. No nausea she is eating and drinking well 2/24; the patient apparently came in 2 days ago for a nurse visit when her wrap fell down. Both areas look a little worse this week macerated medially and satellite lesions laterally. Change to  silver alginate today 3/3; wounds are larger today especially medially. She also has more swelling in her foot lower leg and I even noted some swelling in  her posterior thigh which is tender. I wonder about the patency of her stent. Fortuitously she sees Dr. Claretha Cooper group on Friday 3/10; Mrs. Mastrogiovanni was seen by vein and vascular on 3/5. The patient underwent ultrasound. There was no evidence of thrombosis involving the IVC no evidence of thrombosis involving the right common iliac vein there is no evidence of thrombosis involving the right external iliac vein the left external vein is also patent. The right common iliac vein stent appears patent bilateral common femoral veins are compressible and appear patent. I was concerned about the left common iliac stent however it looks like this is functional. She has some edema in the posterior thigh that was tender she still has that this week. I also note they had trouble finding the pulses in her left foot and booked her for an ABI baseline in 4 weeks. She will follow up in 6 months for repeat IVC duplex. The patient stopped the pentoxifylline because of diarrhea. It does not look like that was being effective in any case. I have advised her to go back on her aspirin 81 mg tablet, vascular it also suggested this 3/17; comes in today with her wound surfaces a lot better. The excoriations from last week considerably better probably secondary to the TCA. We have been using silver alginate 3/24; comes in today with smaller wounds both medially and laterally. Both required debridement. There are 2 small satellite areas superiorly laterally. She also has a very odd bandlike area in the mid calf almost looking like there was a weakness in the wrap in a localized area. I would write this off as being this however anteriorly she has a small raised ballotable area that is very tender almost reminiscent of an abscess but there was no obvious purulent surface to  it. 02/04/20 upon evaluation today patient appears to be doing fairly well in regard to her wounds today. Fortunately there is no signs of active Amber, [name]. ([id number]) infection at this time. No fevers, chills, nausea, vomiting, or diarrhea. She has been tolerating the dressing changes without complication. Fortunately I feel like she is showing signs of improvement although has been sometime since have seen her. Nonetheless the area of concern that Dr. Dellia Nims had last week where she had possibly an area of the wrap that was we can allow the leg to bulge appears to be doing significantly better today there is no signs of anything worsening. 4/7; the patient's wounds on her medial and lateral left leg continue to contract. We have been using a regular alginate. Last week she developed an area on the right medial lower leg which is probably a venous ulcer as well. 4/14; the wounds on her left medial and lateral lower leg continue to contract. Surface eschar. We have been using regular alginate. The area on the right medial lower leg is closed. We have been putting both legs under 4-layer contraction. The patient went back to see vein and vascular she had arterial studies done which were apparently "quite good" per the patient although I have not read their notes I have never felt she had an arterial issue. 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 This is been longstanding and refractory to exercise, leg elevation and longstanding use of compression wraps in our clinic as well as compression stockings on the times we have been able to get these to heal 4/21; we thought she actually might be close this week however she arrives in  clinic with a lot of edema in her upper left calf and into her posterior thigh. This is been an intermittent problem here. She says the wrap fell down but it was replaced with a nurse visit on Monday. We are using calcium  alginate to the wounds and the wound sizes there not terribly larger than last week but there is a lot more edema 4/28; again wound edges are smaller on both sides. Her edema is better controlled than last time. She is obtained her compression pumps from [employer] although they have not been to her home to set these up. 5/5; left medial and left lateral both look stable. I am not sure the medial is any smaller. We have been using calcium alginate under 4-layer compression. oShe had an area on the right medial. This was eschared today. We have been wrapping this as well. She does not tolerate external compression stockings due to a history of various contact allergies. She has her compression pumps however the representative from the company is coming on her to show her how to use these tomorrow Electronic Signature(s) Signed: 03/09/2020 4:28:02 PM By: Linton Ham MD Entered By: Linton Ham on 03/09/2020 13:13:13 Backstrom, Amber Child ([id number]) -------------------------------------------------------------------------------- Physical Exam Details Patient Name: DIONA, RUISI. Date of Service: 03/09/2020 12:45 PM Medical Record Number: [id number] Patient Account Number: [id number] Date of Birth/Sex: 09-Mar-1946 (74 y.o. F) Treating RN: Cornell Barman Primary Care Provider: [name] Other Clinician: Referring Provider: [name] Treating Provider/Extender: Tito Dine in Treatment: 65 Constitutional Sitting or standing Blood Pressure is within target range for patient.. Pulse regular and within target range for patient.Marland Kitchen Respirations regular, non- labored and within target range.. Temperature is normal and within the target range for the patient.Marland Kitchen appears in no distress. Cardiovascular Pedal pulses are palpable. Notes Wound exam; medial and lateral part of the left. The medial part looks stable the lateral part under illumination has skin going through the  wound in different directions leaving some irregularly-shaped open areas however the overall open surface area is a lot smaller. oThe area on the right medial had an eschar on it I remove this there is no open wound here. Electronic Signature(s) Signed: 03/09/2020 4:28:02 PM By: Linton Ham MD Entered By: Linton Ham on 03/09/2020 13:14:59 Hayse, Amber Child ([id number]) -------------------------------------------------------------------------------- Physician Orders Details Patient Name: LANISE, SEDANO. Date of Service: 03/09/2020 12:45 PM Medical Record Number: [id number] Patient Account Number: [id number] Date of Birth/Sex: 08-17-1946 (74 y.o. F) Treating RN: Cornell Barman Primary Care Provider: [name] Other Clinician: Referring Provider: [name] Treating Provider/Extender: Tito Dine in Treatment: 57 Verbal / Phone Orders: No Diagnosis Coding Anesthetic (add to Medication List) Wound #5 Left,Medial Lower Leg o Topical Lidocaine 4% cream applied to wound bed prior to debridement (In Clinic Only). Wound #6 Left,Lateral Lower Leg o Topical Lidocaine 4% cream applied to wound bed prior to debridement (In Clinic Only). Skin Barriers/Peri-Wound Care Wound #5 Left,Medial Lower Leg o Triamcinolone Acetonide Ointment (TCA) - peri-wound Wound #6 Left,Lateral Lower Leg o Triamcinolone Acetonide Ointment (TCA) - peri-wound Primary Wound Dressing Wound #5 Left,Medial Lower Leg o Alginate Wound #6 Left,Lateral Lower Leg o Alginate Secondary Dressing Wound #5 Left,Medial Lower Leg o ABD pad Wound #6 Left,Lateral Lower Leg o ABD pad Dressing Change Frequency Wound #5 Left,Medial Lower Leg o Change dressing every week o Other: - Nurse visit Friday and Monday if needed. Wound #6 Left,Lateral Lower Leg o Change dressing every week o Other: -  Nurse visit Friday and Monday if needed. Follow-up Appointments Wound #5 Left,Medial Lower  Leg o Return Appointment in 1 week. o Nurse Visit as needed Wound #6 Left,Lateral Lower Leg o Return Appointment in 1 week. o Nurse Visit as needed Edema Control Wound #5 Left,Medial Lower Leg o 4 Layer Compression System - Bilateral o Compression Pump: Use compression pump on left lower extremity for 60 minutes, twice daily. o Compression Pump: Use compression pump on right lower extremity for 60 minutes, twice daily. Wound #6 Left,Lateral Lower Leg o 4 Layer Compression System - Bilateral o Compression Pump: Use compression pump on left lower extremity for 60 minutes, twice daily. EMMAROSE, MUCHNICK ([id number]) o Compression Pump: Use compression pump on right lower extremity for 60 minutes, twice daily. Electronic Signature(s) Signed: 03/09/2020 4:28:02 PM By: Linton Ham MD Signed: 03/09/2020 5:22:43 PM By: [name], [name], RN, CWS, [name], [name] Entered By: [name], [name], RN, CWS, Kim on 03/09/2020 13:05:30 ABRIAL, DELLAPENNA ([id number]) -------------------------------------------------------------------------------- Problem List Details Patient Name: [name], WILLAMSON. Date of Service: 03/09/2020 12:45 PM Medical Record Number: [id number] Patient Account Number: [id number] Date of Birth/Sex: Feb 14, 1946 (74 y.o. F) Treating RN: Cornell Barman Primary Care Provider: [name] Other Clinician: Referring Provider: [name] Treating Provider/Extender: Tito Dine in Treatment: 39 Active Problems ICD-10 Encounter Code Description Active Date MDM Diagnosis L97.221 Non-pressure chronic ulcer of left calf limited to breakdown of skin 01/07/2019 No Yes I87.332 Chronic venous hypertension (idiopathic) with ulcer and inflammation of 12/09/2019 No Yes left lower extremity L97.211 Non-pressure chronic ulcer of right calf limited to breakdown of skin 02/10/2020 No Yes I89.0 Lymphedema, not elsewhere classified 12/10/2018 No Yes I82.592 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 Date of Service: 03/09/2020 12:45 PM Medical Record Number: [id number] Patient Account Number: [id number] Date of Birth/Sex: 20-Sep-1946 (74 y.o. F) Treating RN: Cornell Barman Primary Care Provider: [name] Other Clinician: Referring Provider: [name] Treating Provider/Extender: Tito Dine in Treatment: 65 Subjective History of Present Illness (HPI) Pleasant 74 year old with history of chronic venous insufficiency. No diabetes or peripheral vascular disease. Left ABI 1.29. Questionable history of left lower extremity DVT. She developed a recurrent ulceration on her left lateral calf in December 2015, which she attributes to poor diet and subsequent lower extremity edema. She underwent endovenous laser ablation of her left greater saphenous vein in 2010. She underwent laser ablation of accessory branch of left GSV in April 2016 by Dr. Kellie Simmering at [facility]. She was previously wearing [place] boots, which she tolerated well. Tolerating 2 layer compression and cadexomer iodine. She returns to clinic for follow-up and is without new complaints. She denies any significant pain at this time. She reports persistent pain with pressure. No claudication or ischemic rest pain. No fever or chills. No drainage. READMISSION 11/13/16; this is a 74 year old woman who is not a diabetic. She is here for a review of a painful area  on her left medial lower extremity. I note that she was seen here previously last year for wound I believe to be in the same area. At that time she  had undergone previously a left greater saphenous vein ablation by Dr. Kellie Simmering and she had a ablation of the anterior accessory branch of the left greater saphenous vein in March 2016. Seeing that the wound actually closed over. In reviewing the history with her today the ulcer in this area has been recurrent. She describes a biopsy of this area in 2009 that only showed stasis physiology. 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 Do not see that she has ever had arterial studies in the left leg. She tells me she has a follow-up with Dr. Kellie Simmering in roughly 10 days In any case she developed the reopening of this area roughly a month ago. On the background of this she describes rapidly increasing edema which has responded to Lasix 40 mg and metolazone 2.5 mg as well as the patient's lymph massage. She has been told she has both venous insufficiency and lymphedema but she cannot tolerate compression stockings 11/28/16; the patient saw Dr. Kellie Simmering recently. Per the patient he did arterial Dopplers in the office that did not show evidence of arterial insufficiency, per the patient he stated "treat this like an ordinary venous ulcer". She also saw her dermatologist Dr. Ronnald Ramp who felt that this was more of a vascular ulcer. In general things are improving although she arrives today with increasing bilateral lower extremity edema with weeping a deeper fluid through the wound on the left medial leg compatible with some degree of lymphedema 12/04/16; the patient's wound is fully epithelialized but I don't think fully healed. We will do another week of  depression with Promogran and TCA however I suspect we'll be able to discharge her next week. This is a very unusual-looking wound which was initially a figure-of-eight type wound lying on its side surrounded by petechial like hemorrhage. She has had venous ablation on this side. She apparently does not have an arterial issue per Dr. Kellie Simmering. She saw her dermatologist thought it was "vascular". Patient is definitely going to need ongoing compression and I talked about this with her today she will go to elastic therapy after she leaves here next week 12/11/16; the patient's wound is not completely closed today. She has surrounding scar tissue and in further discussion with the patient it would appear that she had ulcers in this area in 2009 for a prolonged period of time ultimately requiring a punch biopsy of this area that only showed venous insufficiency. I did not previously pickup on this part of the history from the patient. 12/18/16; the patient's wound is completely epithelialized. There is no open area here. She has significant bilateral venous insufficiency with secondary lymphedema to a mild-to-moderate degree she does not have compression stockings.. She did not say anything to me when I was in the room, she told our intake nurse that she was still having pain in this area. This isn't unusual recurrent small open area. She is going to go to elastic therapy to obtain compression stockings. 12/25/16; the patient's wound is fully epithelialized. There is no open area here. The patient describes some continued episodic discomfort in this area medial left calf. However everything looks fine and healed here. She is been to elastic therapy and caught herself 15-20 mmHg stockings, they apparently were having trouble getting 20-30 mm stockings  in her size 01/22/17; this is a patient we discharged from the clinic a month ago. She has a recurrent open wound on her medial left calf. She had 15 mm support  stockings. I told her I thought she needed 20-30 mm compression stockings. She tells me that she has been ill with hospitalization secondary to asthma and is been found to have severe hypokalemia likely secondary to a combination of Lasix and metolazone. This morning she noted blistering and leaking fluid on the posterior part of her left leg. She called our intake nurse urgently and we was saw her this afternoon. She has not had any real discomfort here. I don't know that she's been wearing any stockings on this leg for at least 2-3 days. ABIs in this clinic were 1.21 on the right and 1.3 on the left. She is previously seen vascular surgery who does not think that there is a peripheral arterial issue. 01/30/17; Patient arrives with no open wound on the left leg. She has been to elastic therapy and obtained 20-5mmhg below knee stockings and she has one on the right leg today. READMISSION 02/19/18; this Nakahara is a now 74 year old patient we've had in this clinic perhaps 3 times before. I had last looked at her from January 07 December 2016 with an area on the medial left leg. We discharged her on 12/25/16 however she had to be readmitted on 01/22/17 with a recurrence. I have in my notes that we discharged her on 20-30 mm stockings although she tells me she was only wearing support hose because she cannot get stockings on predominantly related to her cervical spine surgery/issues. She has had previous ablations done by vein and vascular in [place] including a great saphenous vein ablation on the left with an anterior accessory branch ablation I think both of these were in 2016. On one of the previous visit she had a biopsy noted 2009 that was negative. She is not felt to have an arterial issue. She is not a diabetic. She does have a history of obstructive sleep apnea hypertension asthma as well as chronic venous insufficiency and lymphedema. On this occasion she noted 2 dry scaly patch on her left leg.  She tried to put lotion on this it didn't really help. There were 2 open areas.the patient has been seeing her primary physician from 02/05/18 through 02/14/18. She had Unna boots applied. The superior wound now on the lateral left leg has closed but she's had one wound that remains open on the lateral left leg. This is not the same spot as we dealt with in 2018. ABIs in this clinic were 1.3 bilaterally ASHBY, LIFF ([id number]) 02/26/18; patient has a small wound on the left lateral calf. Dimensions are down. She has chronic venous insufficiency and lymphedema. 03/05/18; small open area on the left lateral calf. Dimensions are down. Tightly adherent necrotic debris over the surface of the wound which was difficult to remove. Also the dressing [over collagen] stuck to the wound surface. This was removed with some difficulty as well. Change the primary dressing to Hydrofera Blue ready 03/12/18; small open area on the left lateral calf. Comes in with tightly adherent surface eschar as well as some adherent Hydrofera Blue. 03/19/18; open area on the left lateral calf. Again adherent surface eschar as well as some adherent Hydrofera Blue nonviable subcutaneous tissue. She complained of pain all week even with the reduction from 4-3 layer compression I put on last week. Also she had an increase  in her ankle and calf measurements probably related to the same thing. 03/26/18; open area on the left lateral calf. A very small open area remains here. We used silver alginate starting last week as the Hydrofera Blue seem to stick to the wound bed. In using 4-layer compression 04/02/18; the open area in the left lateral calf at some adherent slough which I removed there is no open area here. We are able to transition her into her own compression stocking. Truthfully I think this is probably his support hose. However this does not maintain skin integrity will be limited. She cannot put over the toe compression stockings  on because of neck problems hand problems etc. She is allergic to the lining layer of juxta lites. We might be forced to use extremitease stocking should this fail READMIT 11/24/2018 Patient is now a 74 year old woman who is not a diabetic. She has been in this clinic on at least 3 previous occasions largely with recurrent wounds on her left leg secondary to chronic venous insufficiency with secondary lymphedema. Her situation is complicated by inability to get stockings on and an allergy to neoprene which is apparently a component and at least juxta lites and other stockings. As a result she really has not been wearing any stockings on her legs. She tells [place] that roughly 2 or 3 weeks ago she started noticing a stinging sensation just above her ankle on the left medial aspect. She has been diagnosed with pseudogout and she wondered whether this was what she was experiencing. She tried to dress this with something she bought at the store however subsequently it pulled skin off and now she has an open wound that is not improving. She has been using Vaseline gauze with a cover bandage. She saw her primary doctor last week who put an Haematologist on her. ABIs in this clinic was 1.03 on the left 2/12; the area is on the left medial ankle. Odd-looking wound with what looks to be surface epithelialization but a multitude of small petechial openings. This clearly not closed yet. We have been using silver alginate under 3 layer compression with TCA 2/19; the wound area did not look quite as good this week. Necrotic debris over the majority of the wound surface which required debridement. She continues to have a multitude of what looked to be small petechial openings. She reminds [place] that she had a biopsy on this initially during her first outbreak in 2015 in [place] dermat[facility]. She expresses concern about this being a possible melanoma. 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 I assured her that this does not look anything like melanoma. Except for the petechial reaction it does look like a venous insufficiency area and she certainly has evidence of this on both sides 2/26; a difficult area on the left medial ankle. The patient clearly has chronic venous hypertension with some degree of lymphedema. The odd thing about the area is the small petechial hemorrhages. I am not really sure how to explain this. This was present last time and this is not a compression injury. We have been using Hydrofera Blue which I changed to last week 3/4; still using Hydrofera Blue. Aggressive debridement today. She does not have known arterial issues. She has seen Dr. Kellie Simmering at [facility] vein and vascular and and has an ablation on the left. [Anterior accessory branch of the greater saphenous]. From what I remember they  did not feel she had an arterial issue. The patient has had this area biopsied in 2009 at [facility] dermat[facility] and by her recollection they said this was "stasis". She is also follow-up with dermatology locally who thought that this was more of a vascular issue 3/11; using Hydrofera Blue. Aggressive debridement today. She does not have an arterial issue. We are using 3 layer compression although we may need to go to 4. The patient has been in for multiple changes to her wrap since I last saw her a week ago. She says that the area was leaking. I do not have too much more information on what was found 01/19/19 on evaluation today patient was actually being seen for a nurse visit when unfortunately she had the area on her left lateral lower extremity as well as weeping from the right lower extremity that became apparent. Therefore we did end up actually seeing her for a full visit with myself. She is having some pain at this site as well but fortunately nothing too significant at this point. No fevers, chills, nausea, or  vomiting noted at this time. 3/18-Patient is back to the clinic with the left leg venous leg ulcer, the ulcer is larger in size, has a surface that is densely adherent with fibrinous tissue, the Hydrofera Blue was used but is densely adherent and there was difficulty in removing it. The right lower extremity was also wrapped for weeping edema. Patient has a new area over the left lateral foot above the malleolus that is small and appears to have no debris with intact surrounding skin. Patient is on increased dose of Lasix also as a means to edema management 3/25; the patient has a nonhealing venous ulcer on the medial left leg and last week developed a smaller area on the lateral left calf. We have been using Hydrofera Blue with a contact layer. 4/1; no major change in these wounds areas. Left medial and more recently left lateral calf. I tried Iodoflex last week to aid in debridement she did not tolerate this. She stated her pain was terrible all week. She took the top layer of the 4 layer compression off. 4/8; the patient actually looks somewhat better in terms of her more prominent left lateral calf wound. There is some healthy looking tissue here. She is still complaining of a lot of discomfort. 4/15; patient in a lot of pain secondary to sciatica. She is on a prednisone taper prescribed by her primary physician. She has the 2 areas one on the left medial and more recently a smaller area on the left lateral calf. Both of these just above the malleoli 4/22; her back pain is better but she still states she is very uncomfortable and now feels she is intolerant to the [employer]. No real change in the wounds we have been using Sorbact. She has been previously intolerant to Iodoflex. There is not a lot of option about what we can use to debride this wound under compression that she no doubt needs. sHe states Ultram no longer works for her pain 4/29; no major change in the wounds slightly increased  depth. Surface on the original medial wound perhaps somewhat improved however the more recent area on the lateral left ankle is 100% covered in very adherent debris we have been using Sorbact. She tolerates 4 layer compression well and her edema control is a lot better. She has not had to come in for a nurse check 5/6; no major change in the  condition of the wounds. She did consent to debridement today which was done with some difficulty. Continuing Sorbact. She did not tolerate Iodoflex. She was in for a check of her compression the day after we wrapped her last week this was adjusted but nothing much was found 5/13; no major change in the condition or area of the wounds. I was able to get a fairly aggressive debridement done on the lateral left leg wound. Even using Sorbact under compression. She came back in on Friday to have the wrap changed. She says she felt uncomfortable on the lateral aspect of her ankle. She has a long history of chronic venous insufficiency including previous ablation surgery on this side. 5/20-Patient returns for wounds on left leg with both wounds covered in slough, with the lateral leg wound larger in size, she has been in 3 layer compression and felt more comfortable, she describes pain in ankle, in leg and pins and needles in foot, and is about to try Pamelor for this 6/3; wounds on the left lateral and left medial leg. The area medially which is the most recent of the 2 seems to have had the largest increase in Kellogg, [name]. ([id number]) dimensions. We have been using Sorbac to try and debride the surface. She has been to see orthopedics they apparently did a plain x-ray that was indeterminant. Diagnosed her with neuropathy and they have ordered an MRI to determine if there is underlying osteomyelitis. This was not high on my thought list but I suppose it is prudent. We have advised her to make an appointment with vein and vascular in [place]. She has a history  of a left greater saphenous and accessory vein ablations I wonder if there is anything else that can be done from a surgical point of view to help in these difficult refractory wounds. We have previously healed this wound on one occasion but it keeps on reopening [medial side] 6/10; deep tissue culture I did last week I think on the left medial wound showed both moderate E. coli and moderate staph aureus [MSSA]. She is going to require antibiotics and I have chosen Augmentin. We have been using Sorbact and we have made better looking wound surface on both sides but certainly no improvement in wound area. She was back in last Friday apparently for a dressing changes the wrap was hurting her outer left ankle. She has not managed to get a hold of vein and vascular in [place]. We are going to have to make her that appointment 6/17; patient is tolerating the Augmentin. She had an MRI that I think was ordered by orthopedic surgeon this did not show osteomyelitis or an abscess did suggest cellulitis. We have been using Sorbact to the lateral and medial ankles. We have been trying to arrange a follow-up appointment with vein and vascular in [place] or did her original ablations. We apparently an area sent the request to vein and vascular in [facility] 6/24; patient has completed the Augmentin. We do not yet have a vein and vascular appointment in [place]. I am not sure what the issue is here we have asked her to call tomorrow. We are using Sorbact. Making some improvements and especially the medial wound. Both surfaces however look better medial and lateral. 7/1; the patient has been in contact with vein and vascular in [place] but has not yet received an appointment. Using Sorbact we have gradually improve the wound surface with no improvement in surface area. She is approved for  Apligraf but the wound surface still is not completely viable. She has not had to come in for a dressing change 7/8;  the patient has an appointment with vein and vascular on 7/31 which is a Friday afternoon. She is concerned about getting back here for [place] to dress her wounds. I think it is important to have them goal for her venous reflux/history of ablations etc. to see if anything else can be done. She apparently tested positive for 1 of the blood tests with regards to lupus and saw a rheumatologist. He has raised the issue of vasculitis again. I have had this thought in the past however the evidence seems overwhelming that this is a venous reflux etiology. If the rheumatologist tells me there is clinical and laboratory investigation is positive for lupus I will rethink this. 7/15; the patient's wound surfaces are quite a bit better. The medial area which was her original wound now has no depth although the lateral wound which was the more recent area actually appears larger. Both with viable surfaces which is indeed better. Using Sorbact. I wanted to use Apligraf on her however there is the issue of the vein and vascular appointment on 7/31 at 2:00 in the afternoon which would not allow her to get back to be rewrapped and they would no doubt remove the graft 7/22; the patient's wound surfaces have moderate amount of debris although generally look better. The lateral one is larger with 2 small satellite areas superiorly. We are waiting for her vein and vascular appointment on 7/31. She has been approved for Apligraf which I would like to use after th 7/29; wound surfaces have improved no debridement is required we have been using Sorbact. She sees vein and vascular on Friday with this so question of whether anything can be done to lessen the likelihood of recurrence and/or speed the healing of these areas. She is already had previous ablations. She no doubt has severe venous hypertension 8/5-Patient returns at 1 week, she was in [place] for 3 days by her podiatrist, we have been using so backed to the wound, she  has increased pain in both the wounds on the left lower leg especially the more distal one on the lateral aspect 8/12-Patient returns at 1 week and she is agreeable to having debridement in both wounds on her left leg today. We have been using Sorbact, and vascular studies were reviewed at last visit 8/19; the patient arrives with her wounds fairly clean and no debridement is required. We have used Sorbact which is really done a nice job in cleaning up these very difficult wound surfaces. The patient saw Dr. Donzetta Matters of vascular surgery on 7/31. He did not feel that there was an arterial component. He felt that her treated greater saphenous vein is adequately addressed and that the small saphenous vein did not appear to be involved significantly. She was also noted to have deep venous reflux which is not treatable. Dr. Donzetta Matters mentioned the possibility of a central obstructive component leading to reflux and he offered her central venography. She wanted to discuss this or think about it. I have urged her to go ahead with this. She has had recurrent difficult wounds in these areas which do heal but after months in the clinic. If there is anything that can be done to reduce the likelihood of this I think it is worth it. 9/2 she is still working towards getting follow-up with Dr. Donzetta Matters to schedule her CT. Things are  quite a bit worse venography. I put Apligraf on 2 weeks ago on both wounds on the medial and lateral part of her left lower leg. She arrives in clinic today with 3 superficial additional wounds above the area laterally and one below the wound medially. She describes a lot of discomfort. I think these are probably wrapped injuries. Does not look like she has cellulitis. 07/20/2019 on evaluation today patient appears to be doing somewhat poorly in regard to her lower extremity ulcers. She in fact showed signs of erythema in fact we may even be dealing with an infection at this time. Unfortunately I am  unsure if this is just infection or if indeed there may be some allergic reaction that occurred as a result of the Apligraf application. With that being said that would be unusual but nonetheless not impossible in this patient is one who is unfortunately allergic to quite a bit. Currently we have been using the Sorbact which seems to do as well as anything for her. I do think we may want to obtain a culture today to see if there is anything showing up there that may need to be addressed. 9/16; noted that last week the wounds look worse in 1 week follow-up of the Apligraf. Using Sorbact as of 2 days ago. She arrives with copious amounts of drainage and new skin breakdown on the back of the left calf. The wounds arm more substantial bilaterally. There is a fair amount of swelling in the left calf no overt DVT there is edema present I think in the left greater than right thigh. She is supposed to go on 9/28 for CT venography. The wounds on the medial and lateral calf are worse and she has new skin breakdown posteriorly at least new for me. This is almost developing into a circumferential wound area The Apligraf was taken off last week which I agree with things are not going in the right direction a culture was done we do not have that back yet. She is on Augmentin that she started 2 days ago 9/23; dressing was changed by her nurses on Monday. In general there is no improvement in the wound areas although the area looks less angry than last week. She did get Augmentin for MSSA cultured on the 14th. She still appears to have too much swelling in the left leg even with 3 layer compression 9/30; the patient underwent her procedure on 9/28 by Dr. Donzetta Matters at vascular and vein specialist. She was discovered to have the common iliac vein measuring 12.2 mm but at the level of L4-L5 measured 3 mm. After stenting it measured 10 mm. 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 Rouleaux flow in the common  femoral and femoral vein was observed much improved after stenting. We are using silver alginate to the wounds on the medial and lateral ankle on the left. 4 layer compression 10/7; the patient had fluid swelling around her knee and 4 layer compression. At the advice of vein and vascular this was reduced to 3 layer which she is tolerating better. We have been using silver alginate under 3 layer compression since last Friday 10/14; arrives with the areas on the left ankle looking a lot better. Inflammation in the area also a lot better. She came in for a nurse check on 10/9 10/21; continued nice improvement. Slight improvements in surface area of both the medial and lateral wounds on the left. A lot of the satellite lesions in the  weeping erythema around these from stasis dermatitis is resolved. We have been using silver alginate CANIYA, MITSCH ([id number]) 10/28; general improvement in the entire wound areas although not a lot of change in dimensions the wound certainly looks better. There is a lot less in terms of venous inflammation. Continue silver alginate this week however look towards Hydrofera Blue next week 11/4; very adherent debris on the medial wound left wound is not as bad. We have been using silver alginate. Change to [facility] today 11/11; very adherent debris on both wound areas. She went to vein and vascular last week and follow-up they put in [place] boot on this today. He says the [facility] was adherent. Wound is definitely not as good as last week. Especially on the left there the satellite lesions look more prominent 11/18; absolutely no better. erythema on lateral aspect with tenderness. 09/30/2019 on evaluation today patient appears to actually be doing better. Dr. Dellia Nims did put her on doxycycline last week which I do believe has helped her at this point. Fortunately there is no signs of active infection at this time. No fevers, chills, nausea, vomiting, or diarrhea.  I do believe he may want extend the doxycycline for 7 additional days just to ensure everything does completely cleared up the patient is in agreement with that plan. Otherwise she is going require some sharp debridement today 12/2; patient is completing a 2-week course of doxycycline. I gave her this empirically for inflammation as well as infection when I last saw her 2 weeks ago. All of this seems to be better. She is using silver alginate she has the area on the medial aspect of the larger area laterally and the 2 small satellite regions laterally above the major wound. 12/9; the patient's wound on the left medial and left lateral calf look really quite good. We have been using silver alginate. She saw vein and vascular in follow-up on 10/09/2019. She has had a previous left greater saphenous vein ablation by Dr. Oscar La in 2016. More recently she underwent a left common iliac vein stent by Dr. 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 The swelling is improved and certainly the wounds have improved. The patient shows [place] today area on the right medial calf there is almost no wound but leaking lymphedema. She says she start this started 3 or 4 days ago. She did not traumatize it. It is not painful. She does not wear compression on that side 12/16; the patient continues to do well laterally. Medially still requiring debridement. The area on the right calf did not materialize to anything and is not currently open. We wrapped this last time. She has support stockings for that leg although I am not sure they are going to provide adequate compression 12/23; the lateral wound looks stable. Medially still requiring debridement for tightly adherent fibrinous debris. We've been using silver alginate. Surface area not any different 12/30; neither wound is any better with regards to surface and the area on the left lateral is larger. I been using silver alginate to the left lateral which look quite  good last week and Sorbact to the left medial 11/11/2019. Lateral wound area actually looks better and somewhat smaller. Medial still requires a very aggressive debridement today. We have been using Sorbact on both wound areas 1/13; not much better still adherent debris bilaterally. I been using Sorbact. She has severe venous hypertension. Probably some degree of dermal fibrosis distally. I wonder whether tighter compression might  help and I am going to try that today. We also need to work on the bioburden 1/20; using Sorbact. She has severe venous hypertension status post stent placement for pelvic vein compression. We applied gentamicin last time to see if we could reduce bioburden I had some discussion with her today about the use of pentoxifylline. This is occasionally used in this setting for wounds with refractory venous insufficiency. However this interacts with Plavix. She tells me that she was put on this after stent placement for 3 months. She will call Dr. Claretha Cooper office to discuss 1/27; we are using gentamicin under Sorbact. 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 She has a stent. Wound medially is measuring smaller this week. Laterally measuring slightly larger although she has some satellite lesions superiorly 2/3; gentamicin under Sorbact under 4-layer compression. 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 She has a stent on Plavix. Her wounds are measuring smaller this week. More substantially laterally where there is a satellite lesion superiorly. 2/10; gentamicin under Sorbac. 4-layer compression. Patient communicated with Dr. Donzetta Matters at vein and vascular in [place]. He is okay with the patient coming off Plavix I will therefore start her on pentoxifylline for a 1 month trial. In general her wounds look better today. I had some concerns about swelling in the left thigh however she measures 61.5 on the right and 63 on the mid  thigh which does not suggest there is any difficulty. The patient is not describing any pain. 2/17; gentamicin under Sorbac 4-layer compression. She has been on pentoxifylline for 1 week and complains of loose stool. No nausea she is eating and drinking well 2/24; the patient apparently came in 2 days ago for a nurse visit when her wrap fell down. Both areas look a little worse this week macerated medially and satellite lesions laterally. Change to silver alginate today 3/3; wounds are larger today especially medially. She also has more swelling in her foot lower leg and I even noted some swelling in her posterior thigh which is tender. I wonder about the patency of her stent. Fortuitously she sees Dr. Claretha Cooper group on Friday 3/10; Mrs. Inverso was seen by vein and vascular on 3/5. The patient underwent ultrasound. There was no evidence of thrombosis involving the IVC no evidence of thrombosis involving the right common iliac vein there is no evidence of thrombosis involving the right external iliac vein the left external vein is also patent. The right common iliac vein stent appears patent bilateral common femoral veins are compressible and appear patent. I was concerned about the left common iliac stent however it looks like this is functional. She has some edema in the posterior thigh that was tender she still has that this week. I also note they had trouble finding the pulses in her left foot and booked her for an ABI baseline in 4 weeks. She will follow up in 6 months for repeat IVC duplex. The patient stopped the pentoxifylline because of diarrhea. It does not look like that was being effective in any case. I have advised her to go back on her aspirin 81 mg tablet, vascular it also suggested this 3/17; comes in today with her wound surfaces a lot better. The excoriations from last week considerably better probably secondary to the TCA. We have been using silver alginate 3/24; comes in today  with smaller wounds both medially and laterally. Both required debridement. There are 2 small satellite areas superiorly laterally. She also has a  very odd bandlike area in the mid calf almost looking like there was a weakness in the wrap in a localized area. I would write this off as being this however anteriorly she has a small raised ballotable area that is very tender almost reminiscent of an abscess but there was no obvious purulent surface to it. 02/04/20 upon evaluation today patient appears to be doing fairly well in regard to her wounds today. Fortunately there is no signs of active infection at this time. No fevers, chills, nausea, vomiting, or diarrhea. She has been tolerating the dressing changes without complication. Fortunately I feel like she is showing signs of improvement although has been sometime since have seen her. Nonetheless the area of concern that Dr. Dellia Nims had last week where she had possibly an area of the wrap that was we can allow the leg to bulge appears to be doing significantly better today there is no signs of anything worsening. HAZELEE, WELLMAN ([id number]) 4/7; the patient's wounds on her medial and lateral left leg continue to contract. We have been using a regular alginate. Last week she developed an area on the right medial lower leg which is probably a venous ulcer as well. 4/14; the wounds on her left medial and lateral lower leg continue to contract. Surface eschar. We have been using regular alginate. The area on the right medial lower leg is closed. We have been putting both legs under 4-layer contraction. The patient went back to see vein and vascular she had arterial studies done which were apparently "quite good" per the patient although I have not read their notes I have never felt she had an arterial issue. 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 This is been longstanding and refractory to exercise,  leg elevation and longstanding use of compression wraps in our clinic as well as compression stockings on the times we have been able to get these to heal 4/21; we thought she actually might be close this week however she arrives in clinic with a lot of edema in her upper left calf and into her posterior thigh. This is been an intermittent problem here. She says the wrap fell down but it was replaced with a nurse visit on Monday. We are using calcium alginate to the wounds and the wound sizes there not terribly larger than last week but there is a lot more edema 4/28; again wound edges are smaller on both sides. Her edema is better controlled than last time. She is obtained her compression pumps from [employer] although they have not been to her home to set these up. 5/5; left medial and left lateral both look stable. I am not sure the medial is any smaller. We have been using calcium alginate under 4-layer compression. She had an area on the right medial. This was eschared today. We have been wrapping this as well. She does not tolerate external compression stockings due to a history of various contact allergies. She has her compression pumps however the representative from the company is coming on her to show her how to use these tomorrow Objective Constitutional Sitting or standing Blood Pressure is within target range for patient.. Pulse regular and within target range for patient.Marland Kitchen Respirations regular, non- labored and within target range.. Temperature is normal and within the target range for the patient.Marland Kitchen appears in no distress. Vitals Time Taken: 12:45 PM, Height: 63 in, Weight: 224.7 lbs, BMI: 39.8, Temperature: 98.8 F, Pulse: 83 bpm, Respiratory Rate:  18 breaths/min, Blood Pressure: 136/68 mmHg. Cardiovascular Pedal pulses are palpable. General Notes: Wound exam; medial and lateral part of the left. The medial part looks stable the lateral part under illumination has skin  going through the wound in different directions leaving some irregularly-shaped open areas however the overall open surface area is a lot smaller. The area on the right medial had an eschar on it I remove this there is no open wound here. Integumentary (Hair, Skin) Wound #5 status is Open. Original cause of wound was Gradually Appeared. The wound is located on the Left,Medial Lower Leg. The wound measures 1.2cm length x 1.6cm width x 0.1cm depth; 1.508cm^2 area and 0.151cm^3 volume. There is Fat Layer (Subcutaneous Tissue) Exposed exposed. There is no tunneling or undermining noted. There is a medium amount of serosanguineous drainage noted. There is large (67- 100%) red granulation within the wound bed. There is a small (1-33%) amount of necrotic tissue within the wound bed including Adherent Slough. Wound #6 status is Open. Original cause of wound was Gradually Appeared. The wound is located on the Left,Lateral Lower Leg. The wound measures 1.9cm length x 1.7cm width x 0.1cm depth; 2.537cm^2 area and 0.254cm^3 volume. There is Fat Layer (Subcutaneous Tissue) Exposed exposed. There is no tunneling or undermining noted. There is a medium amount of serosanguineous drainage noted. There is large (67- 100%) red granulation within the wound bed. There is a small (1-33%) amount of necrotic tissue within the wound bed including Adherent Slough. 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 ([id number]) Procedures Wound #5 Pre-procedure diagnosis of Wound #5 is a Lymphedema located on the Left,Medial Lower Leg . There was a Four Layer Compression  Therapy Procedure with a pre-treatment ABI of 1 by Cornell Barman, RN. Post procedure Diagnosis Wound #5: Same as Pre-Procedure Wound #6 Pre-procedure diagnosis of Wound #6 is a Venous Leg Ulcer located on the Left,Lateral Lower Leg . There was a Four Layer Compression Therapy Procedure with a pre-treatment ABI of 1 by Cornell Barman, RN. Post procedure Diagnosis Wound #6: Same as Pre-Procedure There was a Four Layer Compression Therapy Procedure by Cornell Barman, RN. Post procedure Diagnosis Wound #: Same as Pre-Procedure Plan Anesthetic (add to Medication List): Wound #5 Left,Medial Lower Leg: Topical Lidocaine 4% cream applied to wound bed prior to debridement (In Clinic Only). Wound #6 Left,Lateral Lower Leg: Topical Lidocaine 4% cream applied to wound bed prior to debridement (In Clinic Only). Skin Barriers/Peri-Wound Care: Wound #5 Left,Medial Lower Leg: Triamcinolone Acetonide Ointment (TCA) - peri-wound Wound #6 Left,Lateral Lower Leg: Triamcinolone Acetonide Ointment (TCA) - peri-wound Primary Wound Dressing: Wound #5 Left,Medial Lower Leg: Alginate Wound #6 Left,Lateral Lower Leg: Alginate Secondary Dressing: Wound #5 Left,Medial Lower Leg: ABD pad Wound #6 Left,Lateral Lower Leg: ABD pad Dressing Change Frequency: Wound #5 Left,Medial Lower Leg: Change dressing every week Other: - Nurse visit Friday and Monday if needed. Wound #6 Left,Lateral Lower Leg: Change dressing every week Other: - Nurse visit Friday and Monday if needed. Follow-up Appointments: Wound #5 Left,Medial Lower Leg: Return Appointment in 1 week. Nurse Visit as needed Wound #6 Left,Lateral Lower Leg: Return Appointment in 1 week. Nurse Visit as needed Edema Control: Wound #5 Left,Medial Lower  Leg: 4 Layer Compression System - Bilateral Compression Pump: Use compression pump on left lower extremity for 60 minutes, twice daily. Compression Pump: Use compression pump on right lower extremity for 60  minutes, twice daily. Wound #6 Left,Lateral Lower Leg: 4 Layer Compression System - Bilateral Compression Pump: Use compression pump on left lower extremity for 60 minutes, twice daily. Compression Pump: Use compression pump on right lower extremity for 60 minutes, twice daily. 1. Triamcinolone to both wound areas 2. Continue calcium alginate 3. There is nothing open on the right leg. She does not tolerate compression stockings due to contact allergies etc. I am going to have to try to SEBASTIANA, SHAFRAN. ([id number]) transition her into pure external compression pump usage next week 4. I am going to wrap both legs for today transition the right leg next week Electronic Signature(s) Signed: 03/09/2020 4:28:02 PM By: Linton Ham MD Entered By: Linton Ham on 03/09/2020 13:17:58 Intriago, Amber Child ([id number]) -------------------------------------------------------------------------------- SuperBill Details Patient Name: MANILA, CARREJO. Date of Service: 03/09/2020 Medical Record Number: [id number] Patient Account Number: [id number] Date of Birth/Sex: [birth year] (74 y.o. 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 Physician  Procedures CPT4 Code Description: [id number] - WC PHYS LEVEL 3 - EST PT Modifier: Quantity: 1 CPT4 Code Description: ICD-10 Diagnosis Description L97.221 Non-pressure chronic ulcer of left calf limited to breakdown of skin I87.332 Chronic venous hypertension (idiopathic) with ulcer and inflammation of I89.0 Lymphedema, not elsewhere classified  L97.211 Non-pressure chronic ulcer of right calf limited to breakdown of skin Modifier: left lower extremity Quantity: Electronic Signature(s) Signed: 03/09/2020 4:28:02 PM By: Linton Ham MD Entered By: Linton Ham on 03/09/2020 13:18:30

## 2020-03-14 ENCOUNTER — Other Ambulatory Visit: Payer: Self-pay

## 2020-03-14 DIAGNOSIS — L97221 Non-pressure chronic ulcer of left calf limited to breakdown of skin: Secondary | ICD-10-CM | POA: Diagnosis not present

## 2020-03-14 DIAGNOSIS — I890 Lymphedema, not elsewhere classified: Secondary | ICD-10-CM | POA: Diagnosis not present

## 2020-03-14 DIAGNOSIS — I87332 Chronic venous hypertension (idiopathic) with ulcer and inflammation of left lower extremity: Secondary | ICD-10-CM | POA: Diagnosis not present

## 2020-03-14 NOTE — Progress Notes (Signed)
[name], [name] ([id number]) Visit Report for 03/14/2020 Arrival Information Details Patient Name: [name], [name]. Date of Service: 03/14/2020 9:30 AM Medical Record Number: [id number] Patient Account Number: [id number] Date of Birth/Sex: Mar 26, 1946 (74 y.o. F) Treating RN: Montey Hora Primary Care Lyndol Vanderheiden: [name] Other Clinician: Referring Jousha Schwandt: [name] Treating Serigne Kubicek/Extender: Melburn Hake, HOYT Weeks in Treatment: 31 Visit Information History Since Last Visit Added or deleted any medications: No Patient Arrived: Ambulatory Any new allergies or adverse reactions: No Arrival Time: 09:40 Had a fall or experienced change in No Accompanied By: self activities of daily living that may affect Transfer Assistance: None risk of falls: Patient Identification Verified: Yes Signs or symptoms of abuse/neglect since last visito No Secondary Verification Process Completed: Yes Hospitalized since last visit: No Patient Requires Transmission-Based No Implantable device outside of the clinic excluding No Precautions: cellular tissue based products placed in the center Patient Has Alerts: Yes since last visit: Patient Alerts: Patient on Blood Has Dressing in Place as Prescribed: Yes Thinner Has Compression in Place as Prescribed: Yes aspirin 81 Pain Present Now: No Electronic Signature(s) Signed: 03/14/2020 11:26:06 AM By: Montey Hora Entered By: Montey Hora on 03/14/2020 11:26:05 [name] -------------------------------------------------------------------------------- Compression Therapy Details Patient Name: [name], [name]. Date of Service: 03/14/2020 9:30 AM Medical Record Number: [id number] Patient Account Number: [id number] Date of Birth/Sex: [birth year] (74 y.o. F) Treating RN: Montey Hora Primary Care Erielle Gawronski: [name] Other Clinician: Referring Damante Spragg: [name] Treating Daleena Rotter/Extender: STONE III,  HOYT Weeks in Treatment: 65 Compression Therapy Performed for Wound Assessment: Wound #5 Left,Medial Lower Leg Performed By: Clinician Montey Hora, RN Compression Type: Four Layer Pre Treatment ABI: 1 Electronic Signature(s) Signed: 03/14/2020 11:31:58 AM By: Montey Hora Entered By: Montey Hora on 03/14/2020 11:31:58 [name], [name] -------------------------------------------------------------------------------- Compression Therapy Details Patient Name: [name], [name]. Date of Service: 03/14/2020 9:30 AM Medical Record Number: [id number] Patient Account Number: [id number] Date of Birth/Sex: 06-Oct-1946 (74 y.o. F) Treating RN: Montey Hora Primary Care Wendall Isabell: [name] Other Clinician: Referring Henri Baumler: [name] Treating Chanise Habeck/Extender: STONE III, HOYT Weeks in Treatment: 65 Compression Therapy Performed for Wound Assessment: Wound #6 Left,Lateral Lower Leg Performed By: Clinician Montey Hora, RN Compression Type: Four Layer Pre Treatment ABI: 1 Electronic Signature(s) Signed: 03/14/2020 11:31:59 AM By: Montey Hora Entered By: Montey Hora on 03/14/2020 11:31:58 [name], [name] -------------------------------------------------------------------------------- Encounter Discharge Information Details Patient Name: [name], [name]. Date of Service: 03/14/2020 9:30 AM Medical Record Number: [id number] Patient Account Number: [id number] Date of Birth/Sex: 18-Aug-1946 (74 y.o. F) Treating RN: Montey Hora Primary Care Emari Demmer: [name] Other Clinician: Referring Navea Woodrow: [name] Treating Lindsey Hommel/Extender: Melburn Hake, HOYT Weeks in Treatment: 21 Encounter Discharge Information Items Discharge Condition: Stable Ambulatory Status: Ambulatory Discharge Destination: Home Transportation: Private Auto Accompanied By: self Schedule Follow-up Appointment: No Clinical Summary of Care: Electronic  Signature(s) Signed: 03/14/2020 11:33:05 AM By: Montey Hora Entered By: Montey Hora on 03/14/2020 11:33:05 [name], [name] -------------------------------------------------------------------------------- Wound Assessment Details Patient Name: [name], [name]. Date of Service: 03/14/2020 9:30 AM Medical Record Number: [id number] Patient Account Number: [id number] Date of Birth/Sex: May 15, 1946 (74 y.o. F) Treating RN: Montey Hora Primary Care Yuchen Fedor: [name] Other Clinician: Referring Braelon Sprung: [name] Treating Fatmata Legere/Extender: STONE III, HOYT Weeks in Treatment: 65 Wound Status Wound Number: 5 Primary Lymphedema Etiology: Wound Location: Left, Medial Lower Leg Wound Open Wounding Event: Gradually Appeared Status: Date Acquired: 11/19/2018 Comorbid Cataracts, Asthma, Sleep Apnea, Deep Vein Thrombosis, Weeks Of Treatment: 65 History:  Hypertension, Peripheral Venous Disease, Osteoarthritis, Clustered Wound: No Received Chemotherapy, Received Radiation Wound Measurements Length: (cm) 1.2 Width: (cm) 1.6 Depth: (cm) 0.1 Area: (cm) 1.508 Volume: (cm) 0.151 % Reduction in Area: 82.4% % Reduction in Volume: 82.3% Epithelialization: None Tunneling: No Undermining: No Wound Description Classification: Full Thickness Without Exposed Support Structu Exudate Amount: Medium Exudate Type: Serosanguineous Exudate Color: red, brown res Foul Odor After Cleansing: No Slough/Fibrino Yes Wound Bed Granulation Amount: Large (67-100%) Exposed Structure Granulation Quality: Red Fascia Exposed: No Necrotic Amount: Small (1-33%) Fat Layer (Subcutaneous Tissue) Exposed: Yes Necrotic Quality: Adherent Slough Tendon Exposed: No Muscle Exposed: No Joint Exposed: No Bone Exposed: No Treatment Notes Wound #5 (Left, Medial Lower Leg) Notes Bilateral: alginate, ABD, 4 layer TCA Electronic Signature(s) Signed: 03/14/2020 11:30:47 AM By: Montey Hora Entered By: Montey Hora on 03/14/2020 11:30:46 Deyton, [name] ([id number]) -------------------------------------------------------------------------------- Wound Assessment Details Patient Name: [name], [name]. Date of Service: 03/14/2020 9:30 AM Medical Record Number: [id number] Patient Account Number: [id number] Date of Birth/Sex: 10/29/1946 (74 y.o. F) Treating RN: Montey Hora Primary Care [name] Zahm: [name] Other Clinician: Referring Chen Holzman: [name] Treating Pratham Cassatt/Extender: STONE III, HOYT Weeks in Treatment: 65 Wound Status Wound Number: 6 Primary Venous Leg Ulcer Etiology: Wound Location: Left, Lateral Lower Leg Wound Open Wounding Event: Gradually Appeared Status: Date Acquired: 01/19/2019 Comorbid Cataracts, Asthma, Sleep Apnea, Deep Vein Thrombosis, Weeks Of Treatment: 60 History: Hypertension, Peripheral Venous Disease, Osteoarthritis, Clustered Wound: No Received Chemotherapy, Received Radiation Wound Measurements Length: (cm) 1.9 Width: (cm) 1.7 Depth: (cm) 0.1 Area: (cm) 2.537 Volume: (cm) 0.254 % Reduction in Area: -1053.2% % Reduction in Volume: -1054.5% Epithelialization: None Wound Description Classification: Full Thickness Without Exposed Support Structu Exudate Amount: Medium Exudate Type: Serosanguineous Exudate Color: red, brown res Foul Odor After Cleansing: No Slough/Fibrino Yes Wound Bed Granulation Amount: Large (67-100%) Exposed Structure Granulation Quality: Red Fascia Exposed: No Necrotic Amount: Small (1-33%) Fat Layer (Subcutaneous Tissue) Exposed: Yes Necrotic Quality: Adherent Slough Tendon Exposed: No Muscle Exposed: No Joint Exposed: No Bone Exposed: No Treatment Notes Wound #6 (Left, Lateral Lower Leg) Notes Bilateral: alginate, ABD, 4 layer TCA Electronic Signature(s) Signed: 03/14/2020 11:31:13 AM By: Montey Hora Entered By: Montey Hora on 03/14/2020 11:31:13

## 2020-03-16 ENCOUNTER — Encounter: Payer: Medicare Other | Admitting: Internal Medicine

## 2020-03-17 DIAGNOSIS — Z23 Encounter for immunization: Secondary | ICD-10-CM | POA: Diagnosis not present

## 2020-03-18 ENCOUNTER — Other Ambulatory Visit: Payer: Self-pay

## 2020-03-18 DIAGNOSIS — I890 Lymphedema, not elsewhere classified: Secondary | ICD-10-CM | POA: Diagnosis not present

## 2020-03-18 DIAGNOSIS — L97221 Non-pressure chronic ulcer of left calf limited to breakdown of skin: Secondary | ICD-10-CM | POA: Diagnosis not present

## 2020-03-18 DIAGNOSIS — I87332 Chronic venous hypertension (idiopathic) with ulcer and inflammation of left lower extremity: Secondary | ICD-10-CM | POA: Diagnosis not present

## 2020-03-18 NOTE — Progress Notes (Signed)
SHONDIA, FLIKKEMA ([id number]) Visit Report for 03/18/2020 Arrival Information Details Patient Name: [name], [name]. Date of Service: 03/18/2020 9:30 AM Medical Record Number: [id number] Patient Account Number: [id number] Date of Birth/Sex: 05-Aug-1946 (74 y.o. F) Treating RN: Montey Hora Primary Care Satoya Feeley: [name] Other Clinician: Referring Toniya Rozar: [name] Treating Rianna Lukes/Extender: Tito Dine in Treatment: 63 Visit Information History Since Last Visit Added or deleted any medications: No Patient Arrived: Ambulatory Any new allergies or adverse reactions: No Arrival Time: 09:39 Had a fall or experienced change in No Accompanied By: self activities of daily living that may affect Transfer Assistance: None risk of falls: Patient Identification Verified: Yes Signs or symptoms of abuse/neglect since last visito No Secondary Verification Process Completed: Yes Hospitalized since last visit: No Patient Requires Transmission-Based No Implantable device outside of the clinic excluding No Precautions: cellular tissue based products placed in the center Patient Has Alerts: Yes since last visit: Patient Alerts: Patient on Blood Has Dressing in Place as Prescribed: Yes Thinner Has Compression in Place as Prescribed: Yes aspirin 81 Pain Present Now: No Notes 98.3 Electronic Signature(s) Signed: 03/18/2020 10:53:40 AM By: Montey Hora Entered By: Montey Hora on 03/18/2020 09:44:07 Limbach, [name] -------------------------------------------------------------------------------- Compression Therapy Details Patient Name: [name], [name]. Date of Service: 03/18/2020 9:30 AM Medical Record Number: [id number] Patient Account Number: [id number] Date of Birth/Sex: 1945/11/10 (74 y.o. F) Treating RN: Montey Hora Primary Care Clois Treanor: [name] Other Clinician: Referring Iaan Oregel: [name] Treating Kwinton Maahs/Extender:  Tito Dine in Treatment: 66 Compression Therapy Performed for Wound Assessment: Wound #5 Left,Medial Lower Leg Performed By: Clinician Montey Hora, RN Compression Type: Four Layer Pre Treatment ABI: 1 Electronic Signature(s) Signed: 03/18/2020 10:53:40 AM By: Montey Hora Entered By: Montey Hora on 03/18/2020 10:08:10 [name] -------------------------------------------------------------------------------- Compression Therapy Details Patient Name: [name], [name]. Date of Service: 03/18/2020 9:30 AM Medical Record Number: [id number] Patient Account Number: [id number] Date of Birth/Sex: September 28, 1946 (74 y.o. F) Treating RN: Montey Hora Primary Care Theodor Mustin: [name] Other Clinician: Referring Kischa Altice: [name] Treating Hughie Melroy/Extender: Tito Dine in Treatment: 66 Compression Therapy Performed for Wound Assessment: Wound #6 Left,Lateral Lower Leg Performed By: Clinician Montey Hora, RN Compression Type: Four Layer Pre Treatment ABI: 1 Electronic Signature(s) Signed: 03/18/2020 10:53:40 AM By: Montey Hora Entered By: Montey Hora on 03/18/2020 10:08:10 [name] -------------------------------------------------------------------------------- Encounter Discharge Information Details Patient Name: [name], [name]. Date of Service: 03/18/2020 9:30 AM Medical Record Number: [id number] Patient Account Number: [id number] Date of Birth/Sex: 09/02/1946 (74 y.o. F) Treating RN: Montey Hora Primary Care Jacobb Alen: [name] Other Clinician: Referring Luby Seamans: [name] Treating Emerita Berkemeier/Extender: Tito Dine in Treatment: 53 Encounter Discharge Information Items Discharge Condition: Stable Ambulatory Status: Ambulatory Discharge Destination: Home Transportation: Private Auto Accompanied By: self Schedule Follow-up Appointment: No Clinical Summary of  Care: Electronic Signature(s) Signed: 03/18/2020 10:53:40 AM By: Montey Hora Entered By: Montey Hora on 03/18/2020 10:08:55 [name] -------------------------------------------------------------------------------- Wound Assessment Details Patient Name: [name], [name]. Date of Service: 03/18/2020 9:30 AM Medical Record Number: [id number] Patient Account Number: [id number] Date of Birth/Sex: 11/26/1945 (74 y.o. F) Treating RN: Montey Hora Primary Care Anahi Belmar: [name] Other Clinician: Referring Bronda Alfred: [name] Treating Corin Formisano/Extender: Tito Dine in Treatment: 66 Wound Status Wound Number: 5 Primary Etiology: Lymphedema Wound Location: Left, Medial Lower Leg Wound Status: Open Wounding Event: Gradually Appeared Date Acquired: 11/19/2018 Weeks Of Treatment: 66 Clustered Wound: No Wound Measurements Length: (cm)  1.7 Width: (cm) 1.5 Depth: (cm) 0.1 Area: (cm) 2.003 Volume: (cm) 0.2 % Reduction in Area: 76.6% % Reduction in Volume: 76.6% Wound Description Classification: Full Thickness Without Exposed Support Structure s Treatment Notes Wound #5 (Left, Medial Lower Leg) Notes Bilateral: alginate, ABD, 4 layer TCA Electronic Signature(s) Signed: 03/18/2020 10:53:40 AM By: Montey Hora Entered By: Montey Hora on 03/18/2020 09:50:53 Kissinger, [name] -------------------------------------------------------------------------------- Wound Assessment Details Patient Name: [name], [name]. Date of Service: 03/18/2020 9:30 AM Medical Record Number: [id number] Patient Account Number: [id number] Date of Birth/Sex: 04-15-1946 (74 y.o. F) Treating RN: Montey Hora Primary Care Alfretta Pinch: [name] Other Clinician: Referring Alsace Dowd: [name] Treating Trina Asch/Extender: Tito Dine in Treatment: 66 Wound Status Wound Number: 6 Primary Etiology: Venous Leg Ulcer Wound  Location: Left, Lateral Lower Leg Wound Status: Open Wounding Event: Gradually Appeared Date Acquired: 01/19/2019 Weeks Of Treatment: 60 Clustered Wound: No Wound Measurements Length: (cm) 0.5 Width: (cm) 0.8 Depth: (cm) 0.1 Area: (cm) 0.314 Volume: (cm) 0.031 % Reduction in Area: -42.7% % Reduction in Volume: -40.9% Wound Description Classification: Full Thickness Without Exposed Support Structure s Treatment Notes Wound #6 (Left, Lateral Lower Leg) Notes Bilateral: alginate, ABD, 4 layer TCA Electronic Signature(s) Signed: 03/18/2020 10:53:40 AM By: Montey Hora Entered By: Montey Hora on 03/18/2020 09:50:53

## 2020-03-21 ENCOUNTER — Ambulatory Visit: Payer: Medicare Other | Admitting: Podiatry

## 2020-03-22 ENCOUNTER — Other Ambulatory Visit: Payer: Self-pay

## 2020-03-22 ENCOUNTER — Encounter: Payer: Self-pay | Admitting: Pulmonary Disease

## 2020-03-22 ENCOUNTER — Ambulatory Visit (INDEPENDENT_AMBULATORY_CARE_PROVIDER_SITE_OTHER): Payer: Medicare Other | Admitting: Pulmonary Disease

## 2020-03-22 DIAGNOSIS — J4520 Mild intermittent asthma, uncomplicated: Secondary | ICD-10-CM

## 2020-03-22 DIAGNOSIS — G4733 Obstructive sleep apnea (adult) (pediatric): Secondary | ICD-10-CM

## 2020-03-22 MED ORDER — PREDNISONE 10 MG PO TABS: 10.0000 mg | ORAL_TABLET | Freq: Every day | ORAL | 0 refills | Status: DC

## 2020-03-22 NOTE — Assessment & Plan Note (Signed)
CPAP supplies will be renewed for a year.  CPAP is definitely helped improve her daytime somnolence and fatigue  Weight loss encouraged, compliance with goal of at least 4-6 hrs every night is the expectation. Advised against medications with sedative side effects Cautioned against driving when sleepy - understanding that sleepiness will vary on a day to day basis

## 2020-03-22 NOTE — Assessment & Plan Note (Signed)
Favor reactive airways from allergies rather than true asthma Will use low-dose prednisone for short burst to give her relief from allergic rhinitis and conjunctivitis  Prednisone 10 mg tabs  Take 2 tabs daily with food x 5ds, then 1 tab daily with food x 5ds then STOP  Stay on nasocort, claritin, singulair & albuterol MDI as needed

## 2020-03-22 NOTE — Progress Notes (Signed)
   Subjective:    Patient ID: [name], female    DOB: 21-Nov-1945, 74 y.o.   [name]  HPI   74 yo woman for followup of severe OSA & asthma  Moved from [place] 2010 - she was diagnosed with asthma in her 54s and took Advair for a long period of time.  PMH -  treated for melanoma from 2011-2013 requiring interferon therapy. She has Cervical disc disease and underwent fusion  She took neurotin for RLS for years   developed lymphedema after cervical fusion surgery. 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 Follow-up    pt states asthma is causing wheezing.pt is sob short distance.pt takes allergy med for sym but they come back within couple days. pt has use resuce inhaler couple times a week    She is struggling with lymphedema and nonhealing ulcer on her left leg.  She reports perennial allergies and takes Claritin, Nasacort almost all year-round. She reports occasional upper airway wheeze for which she takes albuterol MDI about twice a week She received prednisone for back pain twice over the last 6 months and this really helped with her symptoms.  Compliant with CPAP although she is staying up late, overall compliance is good no missed nights, average about 5 hours per night, moderate leak, no residual AHI on 12 cm  She took him to cats and now has a litter of about 16 cats  Significant tests/ events reviewed PSG 2003 which showed AHI of 81 events per hour and lowest desaturation to 57%. This was corrected by CPAP of 12 cm in with residual events of 5.6 events per hour on a study in 2007. CPAP 11/05/13 to 03/24/14>>used on 140 of 140 nights with average 4 hrs and 36 min. Average AHI is 6.5 with median CPAP 12 cm H2O.   PFTs 04/2014- no airway obs, ratio ok, FEV1 80%, TLC &DLCO nml   Review of Systems Patient denies significant dyspnea,cough, hemoptysis,  chest pain, palpitations, pedal edema, orthopnea,  paroxysmal nocturnal dyspnea, lightheadedness, nausea, vomiting, abdominal or  leg pains      Objective:   Physical Exam   Gen. Pleasant, obese, in no distress ENT - no lesions, no post nasal drip Neck: No JVD, no thyromegaly, no carotid bruits Lungs: no use of accessory muscles, no dullness to percussion, decreased without rales or rhonchi  Cardiovascular: Rhythm regular, heart sounds  normal, no murmurs or gallops, 1+ peripheral edema Musculoskeletal: No deformities, no cyanosis or clubbing , no tremors, legs wrapped in Ace bandage        Assessment & Plan:

## 2020-03-22 NOTE — Patient Instructions (Signed)
Prednisone 10 mg tabs  Take 2 tabs daily with food x 5ds, then 1 tab daily with food x 5ds then STOP  Stay on nasocort, claritin, singulair & albuterol MDI as needed CPAP supplies will be renewed

## 2020-03-23 ENCOUNTER — Other Ambulatory Visit: Payer: Self-pay

## 2020-03-23 ENCOUNTER — Encounter: Payer: Medicare Other | Admitting: Internal Medicine

## 2020-03-23 DIAGNOSIS — I87332 Chronic venous hypertension (idiopathic) with ulcer and inflammation of left lower extremity: Secondary | ICD-10-CM | POA: Diagnosis not present

## 2020-03-23 DIAGNOSIS — I872 Venous insufficiency (chronic) (peripheral): Secondary | ICD-10-CM | POA: Diagnosis not present

## 2020-03-23 DIAGNOSIS — I890 Lymphedema, not elsewhere classified: Secondary | ICD-10-CM | POA: Diagnosis not present

## 2020-03-23 DIAGNOSIS — L97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d: Secondary | ICD-10-CM | POA: Diagnosis not present

## 2020-03-23 DIAGNOSIS — L97221 Non-pressure chronic ulcer of left calf limited to breakdown of skin: Secondary | ICD-10-CM | POA: Diagnosis not present

## 2020-03-24 NOTE — Progress Notes (Signed)
Amber, [name] ([id number]) Visit Report for 03/23/2020 Arrival Information Details Patient Name: [name], [name]. Date of Service: 03/23/2020 12:45 PM Medical Record Number: [id number] Patient Account Number: [id number] Date of Birth/Sex: February 01, 1946 (74 y.o. F) Treating RN: [name] Primary Care [name] [name]: [name] Other Clinician: Referring [name]: [name] Treating [name]/Extender: [name] in Treatment: 67 Visit Information History Since Last Visit Added or deleted any medications: No Patient Arrived: Ambulatory Any new allergies or adverse reactions: No Arrival Time: 12:43 Had a fall or experienced change in No Accompanied By: self activities of daily living that may affect Transfer Assistance: None risk of falls: Patient Identification Verified: Yes Signs or symptoms of abuse/neglect since last visito No Secondary Verification Process Completed: Yes Hospitalized since last visit: No Patient Requires Transmission-Based No Implantable device outside of the clinic excluding No Precautions: cellular tissue based products placed in the center Patient Has Alerts: Yes since last visit: Patient Alerts: Patient on Blood Has Dressing in Place as Prescribed: Yes Thinner Has Compression in Place as Prescribed: Yes aspirin 81 Pain Present Now: No Electronic Signature(s) Signed: 03/23/2020 4:33:32 PM By: [name] RCP, RRT, CHT Entered By: [name] on 03/23/2020 12:44:36 [name], [name] -------------------------------------------------------------------------------- Encounter Discharge Information Details Patient Name: [name], [name]. Date of Service: 03/23/2020 12:45 PM Medical Record Number: [id number] Patient Account Number: [id number] Date of Birth/Sex: [birth year] (74 y.o. F) Treating RN: [name] Primary Care Claudene Gatliff: [name] Other Clinician: Referring [name]: [name] Treating [name]/Extender: [name] in Treatment: 33 Encounter Discharge Information Items Post Procedure Vitals Discharge Condition: Stable Temperature (F): 99.1 Ambulatory Status: Ambulatory Pulse (bpm): 79 Discharge Destination: Home Respiratory Rate (breaths/min): 18 Transportation: Other Blood Pressure (mmHg): 137/75 Accompanied By: self Schedule Follow-up Appointment: Yes Clinical Summary of Care: Electronic Signature(s) Signed: 03/24/2020 9:48:50 AM By: [name], [name], RN, CWS, [name], [name] Entered By: [name] on 03/23/2020 13:15:58 [name] -------------------------------------------------------------------------------- Lower Extremity Assessment Details Patient Name: [name], [name]. Date of Service: 03/23/2020 12:45 PM Medical Record Number: [id number] Patient Account Number: [id number] Date of Birth/Sex: 1946/03/10 (74 y.o. F) Treating RN: [name] Primary Care Taraoluwa Thakur: [name] Other Clinician: Referring Shaquina Gillham: [name] Treating [name]/Extender: [name] in Treatment: 67 Edema Assessment Assessed: [Left: No] [Right: No] Edema: [Left: Yes] [Right: Yes] Calf Left: Right: Point of Measurement: 33 cm From Medial Instep 43 cm 41.5 cm Ankle Left: Right: Point of Measurement: 10 cm From Medial Instep 23 cm 22 cm Vascular Assessment Pulses: Dorsalis Pedis Palpable: [Left:Yes] [Right:Yes] Electronic Signature(s) Signed: 03/23/2020 4:52:06 PM By: [name] Entered By: [name] on 03/23/2020 12:51:48 [name], [name] -------------------------------------------------------------------------------- Multi Wound Chart Details Patient Name: [name], [name]. Date of Service: 03/23/2020 12:45 PM Medical Record Number: [id number] Patient Account Number: [id number] Date of Birth/Sex: 10/18/1946 (74 y.o. F) Treating RN: [name] Primary Care Dorn Hartshorne:  [name] Other Clinician: Referring [name]: [name] Treating [name]: [name] in Treatment: 105 Vital Signs Height(in): 75 Pulse(bpm): 68 Weight(lbs): 224.7 Blood Pressure(mmHg): 137/75 Body Mass Index(BMI): 40 Temperature(F): 99.1 Respiratory Rate(breaths/min): 16 Photos: [N/A:N/A] Wound Location: Left, Medial Lower Leg Left, Lateral Lower Leg N/A Wounding Event: Gradually Appeared Gradually Appeared N/A Primary Etiology: Lymphedema Venous Leg Ulcer N/A Comorbid History: Cataracts, Asthma, Sleep Apnea, Cataracts, Asthma, Sleep Apnea, N/A Deep Vein Thrombosis, Deep Vein Thrombosis, Hypertension, Peripheral Venous Hypertension, Peripheral Venous Disease, Osteoarthritis, Received Disease, Osteoarthritis, Received Chemotherapy, Received Radiation Chemotherapy,  Received Radiation Date Acquired: 11/19/2018 01/19/2019 N/A Weeks of Treatment: 41 61 N/A Wound Status: Open Open N/A Measurements L x W x D (cm) 1x1.1x0.1 0.4x0.6x0.1 N/A Area (cm) : 0.864 0.188 N/A Volume (cm) : 0.086 0.019 N/A % Reduction in Area: 89.90% 14.50% N/A % Reduction in Volume: 89.90% 13.60% N/A Classification: Full Thickness Without Exposed Full Thickness Without Exposed N/A Support Structures Support Structures Exudate Amount: Medium Medium N/A Exudate Type: Serous Serous N/A Exudate Color: amber amber N/A Wound Margin: Flat and Intact Flat and Intact N/A Granulation Amount: Medium (34-66%) Medium (34-66%) N/A Granulation Quality: Pink Pink N/A Necrotic Amount: Medium (34-66%) Medium (34-66%) N/A Necrotic Tissue: Eschar, Adherent Slough Eschar, Adherent Slough N/A Exposed Structures: Fat Layer (Subcutaneous Tissue) Fat Layer (Subcutaneous Tissue) N/A Exposed: Yes Exposed: Yes Fascia: No Fascia: No Tendon: No Tendon: No Muscle: No Muscle: No Joint: No Joint: No Bone: No Bone: No Epithelialization: N/A Small (1-33%) N/A Debridement: N/A Debridement -  Selective/Open N/A Wound Pre-procedure Verification/Time N/A 13:08 N/A Out Taken: Pain Control: N/A Lidocaine N/A Tissue Debrided: N/A Necrotic/Eschar N/A Level: N/A Skin/Dermis N/A Debridement Area (sq cm): N/A 0.24 N/A Instrument: N/A Curette N/A Amber, [name] ([id number]) Bleeding: N/A Minimum N/A Hemostasis Achieved: N/A Pressure N/A Debridement Treatment N/A Procedure was tolerated well N/A Response: Post Debridement N/A 1x1x0.1 N/A Measurements L x W x D (cm) Post Debridement Volume: N/A 0.079 N/A (cm) Procedures Performed: N/A Debridement N/A Treatment Notes Wound #5 (Left, Medial Lower Leg) Notes Left: alginate, ABD, 3 layer TCA, Right compression sock Wound #6 (Left, Lateral Lower Leg) Notes Left: alginate, ABD, 3 layer TCA, Right compression sock Electronic Signature(s) Signed: 03/23/2020 5:06:12 PM By: Linton Ham MD Entered By: Linton Ham on 03/23/2020 13:16:41 Ungar, [name] -------------------------------------------------------------------------------- Multi-Disciplinary Care Plan Details Patient Name: [name], [name]. Date of Service: 03/23/2020 12:45 PM Medical Record Number: [id number] Patient Account Number: [id number] Date of Birth/Sex: 1946/09/29 (74 y.o. F) Treating RN: [name] Primary Care [name] Marcel: [name] Other Clinician: Referring Naelani Lafrance: [name] Treating Teshia Mahone/Extender: [name] in Treatment: 48 Active Inactive Medication Nursing Diagnoses: Knowledge deficit related to medication safety: actual or potential Goals: Patient/caregiver will demonstrate understanding of new oral/IV medications prescribed at the [facility] (topical prescriptions are covered under the skin breakdown problem) Date Initiated: 12/16/2019 Target Resolution Date: 01/13/2020 Goal Status: Active Interventions: Assess for medication contraindications each visit where new medications are prescribed Treatment  Activities: New medication prescribed at [facility] : 12/16/2019 Notes: Soft Tissue Infection Nursing Diagnoses: Impaired tissue integrity Goals: Patient's soft tissue infection will resolve Date Initiated: 12/10/2018 Target Resolution Date: 01/09/2019 Goal Status: Active Interventions: Assess signs and symptoms of infection every visit Notes: Venous Leg Ulcer Nursing Diagnoses: Actual venous Insuffiency (use after diagnosis is confirmed) Goals: Patient will maintain optimal edema control Date Initiated: 12/10/2018 Target Resolution Date: 01/09/2019 Goal Status: Active Interventions: Assess peripheral edema status every visit. Treatment Activities: Therapeutic compression applied : 12/10/2018 Notes: Wound/Skin Impairment Nursing Diagnoses: PREET, HARPHAM ([id number]) Impaired tissue integrity Goals: Patient/caregiver will verbalize understanding of skin care regimen Date Initiated: 12/10/2018 Target Resolution Date: 01/09/2019 Goal Status: Active Interventions: Assess ulceration(s) every visit Treatment Activities: Topical wound management initiated : 12/10/2018 Notes: Electronic Signature(s) Signed: 03/23/2020 4:52:06 PM By: [name] Entered By: [name] on 03/23/2020 13:03:04 [name] -------------------------------------------------------------------------------- Pain Assessment Details Patient Name: [name], [name]. Date of Service: 03/23/2020 12:45 PM Medical Record Number: [id number] Patient Account Number: [id number] Date of Birth/Sex: [birth year] (74 y.o. F)  Treating RN: [name] Primary Care Emaley Applin: [name] Other Clinician: Referring Edwena Mayorga: [name] Treating Etienne Mowers/Extender: [name] in Treatment: 51 Active Problems Location of Pain Severity and Description of Pain Patient Has Paino No Site Locations Pain Management and Medication Current Pain Management: Electronic Signature(s) Signed:  03/23/2020 4:52:06 PM By: [name] Entered By: [name] on 03/23/2020 12:49:23 Pillard, [name] -------------------------------------------------------------------------------- Patient/Caregiver Education Details Patient Name: [name], [name]. Date of Service: 03/23/2020 12:45 PM Medical Record Number: [id number] Patient Account Number: [id number] Date of Birth/Gender: 1946/03/06 (74 y.o. F) Treating RN: [name] Primary Care Physician: [name] Other Clinician: Referring Physician: [name] Treating Physician/Extender: [name] in Treatment: 50 Education Assessment Education Provided To: Patient Education Topics Provided Wound Debridement: Handouts: Wound Debridement Methods: Demonstration, Explain/Verbal Responses: State content correctly Wound/Skin Impairment: Handouts: Caring for Your Ulcer Methods: Demonstration, Explain/Verbal Responses: State content correctly Electronic Signature(s) Signed: 03/24/2020 9:48:50 AM By: [name], [name], RN, CWS, [name], [name] Entered By: [name] on 03/23/2020 13:14:18 [name] -------------------------------------------------------------------------------- Wound Assessment Details Patient Name: [name], [name]. Date of Service: 03/23/2020 12:45 PM Medical Record Number: [id number] Patient Account Number: [id number] Date of Birth/Sex: 29-Dec-1945 (74 y.o. F) Treating RN: [name] Primary Care Jak Haggar: [name] Other Clinician: Referring Klover Priestly: [name] Treating Chidubem Chaires/Extender: [name] in Treatment: 74 Wound Status Wound Number: 5 Primary Lymphedema Etiology: Wound Location: Left, Medial Lower Leg Wound Open Wounding Event: Gradually Appeared Status: Date Acquired: 11/19/2018 Comorbid Cataracts, Asthma, Sleep Apnea, Deep Vein Thrombosis, Weeks Of Treatment: 67 History: Hypertension, Peripheral Venous Disease,  Osteoarthritis, Clustered Wound: No Received Chemotherapy, Received Radiation Photos Wound Measurements Length: (cm) 1 Width: (cm) 1.1 Depth: (cm) 0.1 Area: (cm) 0.864 Volume: (cm) 0.086 % Reduction in Area: 89.9% % Reduction in Volume: 89.9% Tunneling: No Undermining: No Wound Description Classification: Full Thickness Without Exposed Support Structures Wound Margin: Flat and Intact Exudate Amount: Medium Exudate Type: Serous Exudate Color: amber Foul Odor After Cleansing: No Slough/Fibrino Yes Wound Bed Granulation Amount: Medium (34-66%) Exposed Structure Granulation Quality: Pink Fascia Exposed: No Necrotic Amount: Medium (34-66%) Fat Layer (Subcutaneous Tissue) Exposed: Yes Necrotic Quality: Eschar, Adherent Slough Tendon Exposed: No Muscle Exposed: No Joint Exposed: No Bone Exposed: No Treatment Notes Wound #5 (Left, Medial Lower Leg) Notes Left: alginate, ABD, 3 layer TCA, Right compression sock Electronic Signature(s) ESTREYA, TENISON ([id number]) Signed: 03/23/2020 4:52:06 PM By: [name] Entered By: [name] on 03/23/2020 12:58:40 Nepomuceno, [name] -------------------------------------------------------------------------------- Wound Assessment Details Patient Name: [name], [name]. Date of Service: 03/23/2020 12:45 PM Medical Record Number: [id number] Patient Account Number: [id number] Date of Birth/Sex: 1946/08/23 (74 y.o. F) Treating RN: [name] Primary Care Aamina Skiff: [name] Other Clinician: Referring Spence Soberano: [name] Treating [name]/Extender: [name] in Treatment: 44 Wound Status Wound Number: 6 Primary Venous Leg Ulcer Etiology: Wound Location: Left, Lateral Lower Leg Wound Open Wounding Event: Gradually Appeared Status: Date Acquired: 01/19/2019 Comorbid Cataracts, Asthma, Sleep Apnea, Deep Vein Thrombosis, Weeks Of Treatment: 61 History: Hypertension, Peripheral Venous  Disease, Osteoarthritis, Clustered Wound: No Received Chemotherapy, Received Radiation Photos Wound Measurements Length: (cm) 0.4 Width: (cm) 0.6 Depth: (cm) 0.1 Area: (cm) 0.188 Volume: (cm) 0.019 % Reduction in Area: 14.5% % Reduction in Volume: 13.6% Epithelialization: Small (1-33%) Tunneling: No Undermining: No Wound Description Classification: Full Thickness Without Exposed Support Structu Wound Margin: Flat and Intact Exudate Amount: Medium Exudate Type: Serous Exudate Color: amber res Foul  Odor After Cleansing: No Slough/Fibrino Yes Wound Bed Granulation Amount: Medium (34-66%) Exposed Structure Granulation Quality: Pink Fascia Exposed: No Necrotic Amount: Medium (34-66%) Fat Layer (Subcutaneous Tissue) Exposed: Yes Necrotic Quality: Eschar, Adherent Slough Tendon Exposed: No Muscle Exposed: No Joint Exposed: No Bone Exposed: No Treatment Notes Wound #6 (Left, Lateral Lower Leg) Notes Left: alginate, ABD, 3 layer TCA, Right compression sock Electronic Signature(s) GRETCHAN, GARA ([id number]) Signed: 03/23/2020 4:52:06 PM By: [name] Entered By: [name] on 03/23/2020 12:59:23 Louro, [name] -------------------------------------------------------------------------------- Vitals Details Patient Name: [name], [name]. Date of Service: 03/23/2020 12:45 PM Medical Record Number: [id number] Patient Account Number: [id number] Date of Birth/Sex: 1946-09-03 (74 y.o. F) Treating RN: [name] Primary Care Katlyne Nishida: [name] Other Clinician: Referring Koston Hennes: [name] Treating [name] [name]/Extender: [name] in Treatment: 67 Vital Signs Time Taken: 12:40 Temperature (F): 99.1 Height (in): 63 Pulse (bpm): 79 Weight (lbs): 224.7 Respiratory Rate (breaths/min): 16 Body Mass Index (BMI): 39.8 Blood Pressure (mmHg): 137/75 Reference Range: 80 - 120 mg / dl Electronic Signature(s) Signed: 03/23/2020  4:33:32 PM By: [name] RCP, RRT, CHT Entered By: [name] on 03/23/2020 12:45:37

## 2020-03-24 NOTE — Progress Notes (Signed)
[name], [name] ([id number]) Visit Report for 03/23/2020 Debridement Details Patient Name: [name], [name]. Date of Service: 03/23/2020 12:45 PM Medical Record Number: [id number] Patient Account Number: [id number] Date of Birth/Sex: 01/15/1946 (74 y.o. F) Treating RN: Cornell Barman Primary Care Provider: [name] Other Clinician: Referring Provider: [name] Treating Provider/Extender: Tito Dine in Treatment: 67 Debridement Performed for Wound #6 Left,Lateral Lower Leg Assessment: Performed By: Physician [name] Dillon, MD Debridement Type: Debridement Severity of Tissue Pre Debridement: Limited to breakdown of skin Level of Consciousness (Pre- Awake and Alert procedure): Pre-procedure Verification/Time Out Yes - 13:08 Taken: Pain Control: Lidocaine Total Area Debrided (L x W): 0.4 (cm) x 0.6 (cm) = 0.24 (cm) Tissue and other material Viable, Non-Viable, Eschar, Subcutaneous, Skin: Dermis , Fibrin/Exudate debrided: Level: Skin/Subcutaneous Tissue Debridement Description: Excisional Instrument: Curette Bleeding: Minimum Hemostasis Achieved: Pressure End Time: 13:10 Response to Treatment: Procedure was tolerated well Level of Consciousness (Post- Awake and Alert procedure): Post Debridement Measurements of Total Wound Length: (cm) 1 Width: (cm) 1 Depth: (cm) 0.1 Volume: (cm) 0.079 Character of Wound/Ulcer Post Debridement: Stable Severity of Tissue Post Debridement: Fat layer exposed Post Procedure Diagnosis Same as Pre-procedure Electronic Signature(s) Signed: 03/23/2020 5:06:12 PM By: Linton Ham MD Signed: 03/24/2020 9:48:50 AM By: [name], [name], RN, CWS, [name], [name] Entered By: Linton Ham on 03/23/2020 13:32:36 [name], [name] -------------------------------------------------------------------------------- HPI Details Patient Name: [name], [name]. Date of Service: 03/23/2020 12:45 PM Medical Record Number:  [id number] Patient Account Number: [id number] Date of Birth/Sex: 06-14-1946 (74 y.o. F) Treating RN: Cornell Barman Primary Care Provider: [name] Other Clinician: Referring Provider: [name] Treating Provider/Extender: Tito Dine in Treatment: 22 History of Present Illness HPI Description: Pleasant 74 year old with history of chronic venous insufficiency. No diabetes or peripheral vascular disease. Left ABI 1.29. Questionable history of left lower extremity DVT. She developed a recurrent ulceration on her left lateral calf in December 2015, which she attributes to poor diet and subsequent lower extremity edema. She underwent endovenous laser ablation of her left greater saphenous vein in 2010. She underwent laser ablation of accessory branch of left GSV in April 2016 by Dr. Kellie Simmering at [facility]. She was previously wearing Unna boots, which she tolerated well. Tolerating 2 layer compression and cadexomer iodine. She returns to clinic for follow-up and is without new complaints. She denies any significant pain at this time. She reports persistent pain with pressure. No claudication or ischemic rest pain. No fever or chills. No drainage. READMISSION 11/13/16; this is a 74 year old woman who is not a diabetic. She is here for a review of a painful area on her left medial lower extremity. I note that she was seen here previously last year for wound I believe to be in the same area. At that time she had undergone previously a left greater saphenous vein ablation by Dr. Kellie Simmering and she had a ablation of the anterior accessory branch of the left greater saphenous vein in March 2016. Seeing that the wound actually closed over. In reviewing the history with her today the ulcer in this area has been recurrent. She describes a biopsy of this area in 2009 that only showed stasis physiology. She also has a history of today malignant melanoma in the right shoulder for which she follows  with Dr. 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 Do not see  that she has ever had arterial studies in the left leg. She tells me she has a follow-up with Dr. Kellie Simmering in roughly 10 days In any case she developed the reopening of this area roughly a month ago. On the background of this she describes rapidly increasing edema which has responded to Lasix 40 mg and metolazone 2.5 mg as well as the patient's lymph massage. She has been told she has both venous insufficiency and lymphedema but she cannot tolerate compression stockings 11/28/16; the patient saw Dr. Kellie Simmering recently. Per the patient he did arterial Dopplers in the office that did not show evidence of arterial insufficiency, per the patient he stated "treat this like an ordinary venous ulcer". She also saw her dermatologist Dr. Ronnald Ramp who felt that this was more of a vascular ulcer. In general things are improving although she arrives today with increasing bilateral lower extremity edema with weeping a deeper fluid through the wound on the left medial leg compatible with some degree of lymphedema 12/04/16; the patient's wound is fully epithelialized but I don't think fully healed. We will do another week of depression with Promogran and TCA however I suspect we'll be able to discharge her next week. This is a very unusual-looking wound which was initially a figure-of-eight type wound lying on its side surrounded by petechial like hemorrhage. She has had venous ablation on this side. She apparently does not have an arterial issue per Dr. Kellie Simmering. She saw her dermatologist thought it was "vascular". Patient is definitely going to need ongoing compression and I talked about this with her today she will go to elastic therapy after she leaves here next week 12/11/16; the patient's wound is not completely closed today. She has surrounding  scar tissue and in further discussion with the patient it would appear that she had ulcers in this area in 2009 for a prolonged period of time ultimately requiring a punch biopsy of this area that only showed venous insufficiency. I did not previously pickup on this part of the history from the patient. 12/18/16; the patient's wound is completely epithelialized. There is no open area here. She has significant bilateral venous insufficiency with secondary lymphedema to a mild-to-moderate degree she does not have compression stockings.. She did not say anything to me when I was in the room, she told our intake nurse that she was still having pain in this area. This isn't unusual recurrent small open area. She is going to go to elastic therapy to obtain compression stockings. 12/25/16; the patient's wound is fully epithelialized. There is no open area here. The patient describes some continued episodic discomfort in this area medial left calf. However everything looks fine and healed here. She is been to elastic therapy and caught herself 15-20 mmHg stockings, they apparently were having trouble getting 20-30 mm stockings in her size 01/22/17; this is a patient we discharged from the clinic a month ago. She has a recurrent open wound on her medial left calf. She had 15 mm support stockings. I told her I thought she needed 20-30 mm compression stockings. She tells me that she has been ill with hospitalization secondary to asthma and is been found to have severe hypokalemia likely secondary to a combination of Lasix and metolazone. This morning she noted blistering and leaking fluid on the posterior part of her left leg. She called our intake nurse urgently and we was saw her this afternoon. She has not had any real discomfort here. I don't know that she's been  wearing any stockings on this leg for at least 2-3 days. ABIs in this clinic were 1.21 on the right and 1.3 on the left. She is previously seen  vascular surgery who does not think that there is a peripheral arterial issue. 01/30/17; Patient arrives with no open wound on the left leg. She has been to elastic therapy and obtained 20-4mmhg below knee stockings and she has one on the right leg today. READMISSION 02/19/18; this Hauswirth is a now 74 year old patient we've had in this clinic perhaps 3 times before. I had last looked at her from January 07 December 2016 with an area on the medial left leg. We discharged her on 12/25/16 however she had to be readmitted on 01/22/17 with a recurrence. I have in my notes that we discharged her on 20-30 mm stockings although she tells me she was only wearing support hose because she cannot get stockings on predominantly related to her cervical spine surgery/issues. She has had previous ablations done by vein and vascular in [place] including a great saphenous vein ablation on the left with an anterior accessory branch ablation I think both of these were in 2016. On one of the previous visit she had a biopsy noted 2009 that was negative. She is not felt to have an arterial issue. She is not a diabetic. She does have a history of obstructive sleep apnea hypertension asthma as well as chronic venous insufficiency and lymphedema. On this occasion she noted 2 dry scaly patch on her left leg. She tried to put lotion on this it didn't really help. There were 2 open areas.the patient has been seeing her primary physician from 02/05/18 through 02/14/18. She had Unna boots applied. The superior wound now on the lateral left leg has closed but she's had one wound that remains open on the lateral left leg. This is not the same spot as we dealt with in 2018. ABIs in this clinic were 1.3 bilaterally [name], [name] 02/26/18; patient has a small wound on the left lateral calf. Dimensions are down. She has chronic venous insufficiency and lymphedema. 03/05/18; small open area on the left lateral calf. Dimensions  are down. Tightly adherent necrotic debris over the surface of the wound which was difficult to remove. Also the dressing [over collagen] stuck to the wound surface. This was removed with some difficulty as well. Change the primary dressing to Hydrofera Blue ready 03/12/18; small open area on the left lateral calf. Comes in with tightly adherent surface eschar as well as some adherent Hydrofera Blue. 03/19/18; open area on the left lateral calf. Again adherent surface eschar as well as some adherent Hydrofera Blue nonviable subcutaneous tissue. She complained of pain all week even with the reduction from 4-3 layer compression I put on last week. Also she had an increase in her ankle and calf measurements probably related to the same thing. 03/26/18; open area on the left lateral calf. A very small open area remains here. We used silver alginate starting last week as the Hydrofera Blue seem to stick to the wound bed. In using 4-layer compression 04/02/18; the open area in the left lateral calf at some adherent slough which I removed there is no open area here. We are able to transition her into her own compression stocking. Truthfully I think this is probably his support hose. However this does not maintain skin integrity will be limited. She cannot put over the toe compression stockings on because of neck problems hand problems etc.  She is allergic to the lining layer of juxta lites. We might be forced to use extremitease stocking should this fail READMIT 11/24/2018 Patient is now a 74 year old woman who is not a diabetic. She has been in this clinic on at least 3 previous occasions largely with recurrent wounds on her left leg secondary to chronic venous insufficiency with secondary lymphedema. Her situation is complicated by inability to get stockings on and an allergy to neoprene which is apparently a component and at least juxta lites and other stockings. As a result she really has not been wearing  any stockings on her legs. She tells [place] that roughly 2 or 3 weeks ago she started noticing a stinging sensation just above her ankle on the left medial aspect. She has been diagnosed with pseudogout and she wondered whether this was what she was experiencing. She tried to dress this with something she bought at the store however subsequently it pulled skin off and now she has an open wound that is not improving. She has been using Vaseline gauze with a cover bandage. She saw her primary doctor last week who put an Haematologist on her. ABIs in this clinic was 1.03 on the left 2/12; the area is on the left medial ankle. Odd-looking wound with what looks to be surface epithelialization but a multitude of small petechial openings. This clearly not closed yet. We have been using silver alginate under 3 layer compression with TCA 2/19; the wound area did not look quite as good this week. Necrotic debris over the majority of the wound surface which required debridement. She continues to have a multitude of what looked to be small petechial openings. She reminds [place] that she had a biopsy on this initially during her first outbreak in 2015 in [place] dermatology. She expresses concern about this being a possible melanoma. 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 I assured her that this does not look anything like melanoma. Except for the petechial reaction it does look like a venous insufficiency area and she certainly has evidence of this on both sides 2/26; a difficult area on the left medial ankle. The patient clearly has chronic venous hypertension with some degree of lymphedema. The odd thing about the area is the small petechial hemorrhages. I am not really sure how to explain this. This was present last time and this is not a compression injury. We have been using Hydrofera Blue which I changed to last week 3/4;  still using Hydrofera Blue. Aggressive debridement today. She does not have known arterial issues. She has seen Dr. Kellie Simmering at [facility] vein [facility] vascular and and has an ablation on the left. [Anterior accessory branch of the greater saphenous]. From what I remember they did not feel she had an arterial issue. The patient has had this area biopsied in 2009 at [facility] dermatology and by her recollection they said this was "stasis". She is also follow-up with dermatology locally who thought that this was more of a vascular issue 3/11; using Hydrofera Blue. Aggressive debridement today. She does not have an arterial issue. We are using 3 layer compression although we may need to go to 4. The patient has been in for multiple changes to her wrap since I last saw her a week ago. She says that the area was leaking. I do not have too much more information on what was found 01/19/19 on evaluation today patient  was actually being seen for a nurse visit when unfortunately she had the area on her left lateral lower extremity as well as weeping from the right lower extremity that became apparent. Therefore we did end up actually seeing her for a full visit with myself. She is having some pain at this site as well but fortunately nothing too significant at this point. No fevers, chills, nausea, or vomiting noted at this time. 3/18-Patient is back to the clinic with the left leg venous leg ulcer, the ulcer is larger in size, has a surface that is densely adherent with fibrinous tissue, the Hydrofera Blue was used but is densely adherent and there was difficulty in removing it. The right lower extremity was also wrapped for weeping edema. Patient has a new area over the left lateral foot above the malleolus that is small and appears to have no debris with intact surrounding skin. Patient is on increased dose of Lasix also as a means to edema management 3/25; the patient has a nonhealing venous ulcer on the medial  left leg and last week developed a smaller area on the lateral left calf. We have been using Hydrofera Blue with a contact layer. 4/1; no major change in these wounds areas. Left medial and more recently left lateral calf. I tried Iodoflex last week to aid in debridement she did not tolerate this. She stated her pain was terrible all week. She took the top layer of the 4 layer compression off. 4/8; the patient actually looks somewhat better in terms of her more prominent left lateral calf wound. There is some healthy looking tissue here. She is still complaining of a lot of discomfort. 4/15; patient in a lot of pain secondary to sciatica. She is on a prednisone taper prescribed by her primary physician. She has the 2 areas one on the left medial and more recently a smaller area on the left lateral calf. Both of these just above the malleoli 4/22; her back pain is better but she still states she is very uncomfortable and now feels she is intolerant to the [employer]. No real change in the wounds we have been using Sorbact. She has been previously intolerant to Iodoflex. There is not a lot of option about what we can use to debride this wound under compression that she no doubt needs. sHe states Ultram no longer works for her pain 4/29; no major change in the wounds slightly increased depth. Surface on the original medial wound perhaps somewhat improved however the more recent area on the lateral left ankle is 100% covered in very adherent debris we have been using Sorbact. She tolerates 4 layer compression well and her edema control is a lot better. She has not had to come in for a nurse check 5/6; no major change in the condition of the wounds. She did consent to debridement today which was done with some difficulty. Continuing Sorbact. She did not tolerate Iodoflex. She was in for a check of her compression the day after we wrapped her last week this was adjusted but nothing much was found 5/13; no  major change in the condition or area of the wounds. I was able to get a fairly aggressive debridement done on the lateral left leg wound. Even using Sorbact under compression. She came back in on Friday to have the wrap changed. She says she felt uncomfortable on the lateral aspect of her ankle. She has a long history of chronic venous insufficiency including previous ablation surgery  on this side. 5/20-Patient returns for wounds on left leg with both wounds covered in slough, with the lateral leg wound larger in size, she has been in 3 layer compression and felt more comfortable, she describes pain in ankle, in leg and pins and needles in foot, and is about to try Pamelor for this 6/3; wounds on the left lateral and left medial leg. The area medially which is the most recent of the 2 seems to have had the largest increase in [name], [name]. ([id number]) dimensions. We have been using Sorbac to try and debride the surface. She has been to see orthopedics they apparently did a plain x-ray that was indeterminant. Diagnosed her with neuropathy and they have ordered an MRI to determine if there is underlying osteomyelitis. This was not high on my thought list but I suppose it is prudent. We have advised her to make an appointment with vein and vascular in [place]. She has a history of a left greater saphenous and accessory vein ablations I wonder if there is anything else that can be done from a surgical point of view to help in these difficult refractory wounds. We have previously healed this wound on one occasion but it keeps on reopening [medial side] 6/10; deep tissue culture I did last week I think on the left medial wound showed both moderate E. coli and moderate staph aureus [MSSA]. She is going to require antibiotics and I have chosen Augmentin. We have been using Sorbact and we have made better looking wound surface on both sides but certainly no improvement in wound area. She was back in last  Friday apparently for a dressing changes the wrap was hurting her outer left ankle. She has not managed to get a hold of vein and vascular in [place]. We are going to have to make her that appointment 6/17; patient is tolerating the Augmentin. She had an MRI that I think was ordered by orthopedic surgeon this did not show osteomyelitis or an abscess did suggest cellulitis. We have been using Sorbact to the lateral and medial ankles. We have been trying to arrange a follow-up appointment with vein and vascular in [place] or did her original ablations. We apparently an area sent the request to vein and vascular in [facility] 6/24; patient has completed the Augmentin. We do not yet have a vein and vascular appointment in [place]. I am not sure what the issue is here we have asked her to call tomorrow. We are using Sorbact. Making some improvements and especially the medial wound. Both surfaces however look better medial and lateral. 7/1; the patient has been in contact with vein and vascular in [place] but has not yet received an appointment. Using Sorbact we have gradually improve the wound surface with no improvement in surface area. She is approved for Apligraf but the wound surface still is not completely viable. She has not had to come in for a dressing change 7/8; the patient has an appointment with vein and vascular on 7/31 which is a Friday afternoon. She is concerned about getting back here for [place] to dress her wounds. I think it is important to have them goal for her venous reflux/history of ablations etc. to see if anything else can be done. She apparently tested positive for 1 of the blood tests with regards to lupus and saw a rheumatologist. He has raised the issue of vasculitis again. I have had this thought in the past however the evidence seems overwhelming that this is  a venous reflux etiology. If the rheumatologist tells me there is clinical and laboratory investigation is  positive for lupus I will rethink this. 7/15; the patient's wound surfaces are quite a bit better. The medial area which was her original wound now has no depth although the lateral wound which was the more recent area actually appears larger. Both with viable surfaces which is indeed better. Using Sorbact. I wanted to use Apligraf on her however there is the issue of the vein and vascular appointment on 7/31 at 2:00 in the afternoon which would not allow her to get back to be rewrapped and they would no doubt remove the graft 7/22; the patient's wound surfaces have moderate amount of debris although generally look better. The lateral one is larger with 2 small satellite areas superiorly. We are waiting for her vein and vascular appointment on 7/31. She has been approved for Apligraf which I would like to use after th 7/29; wound surfaces have improved no debridement is required we have been using Sorbact. She sees vein and vascular on Friday with this so question of whether anything can be done to lessen the likelihood of recurrence and/or speed the healing of these areas. She is already had previous ablations. She no doubt has severe venous hypertension 8/5-Patient returns at 1 week, she was in [employer] for 3 days by her podiatrist, we have been using so backed to the wound, she has increased pain in both the wounds on the left lower leg especially the more distal one on the lateral aspect 8/12-Patient returns at 1 week and she is agreeable to having debridement in both wounds on her left leg today. We have been using Sorbact, and vascular studies were reviewed at last visit 8/19; the patient arrives with her wounds fairly clean and no debridement is required. We have used Sorbact which is really done a nice job in cleaning up these very difficult wound surfaces. The patient saw Dr. Donzetta Matters of vascular surgery on 7/31. He did not feel that there was an arterial component. He felt that her treated  greater saphenous vein is adequately addressed and that the small saphenous vein did not appear to be involved significantly. She was also noted to have deep venous reflux which is not treatable. Dr. Donzetta Matters mentioned the possibility of a central obstructive component leading to reflux and he offered her central venography. She wanted to discuss this or think about it. I have urged her to go ahead with this. She has had recurrent difficult wounds in these areas which do heal but after months in the clinic. If there is anything that can be done to reduce the likelihood of this I think it is worth it. 9/2 she is still working towards getting follow-up with Dr. Donzetta Matters to schedule her CT. Things are quite a bit worse venography. I put Apligraf on 2 weeks ago on both wounds on the medial and lateral part of her left lower leg. She arrives in clinic today with 3 superficial additional wounds above the area laterally and one below the wound medially. She describes a lot of discomfort. I think these are probably wrapped injuries. Does not look like she has cellulitis. 07/20/2019 on evaluation today patient appears to be doing somewhat poorly in regard to her lower extremity ulcers. She in fact showed signs of erythema in fact we may even be dealing with an infection at this time. Unfortunately I am unsure if this is just infection or if indeed there  may be some allergic reaction that occurred as a result of the Apligraf application. With that being said that would be unusual but nonetheless not impossible in this patient is one who is unfortunately allergic to quite a bit. Currently we have been using the Sorbact which seems to do as well as anything for her. I do think we may want to obtain a culture today to see if there is anything showing up there that may need to be addressed. 9/16; noted that last week the wounds look worse in 1 week follow-up of the Apligraf. Using Sorbact as of 2 days ago. She arrives with  copious amounts of drainage and new skin breakdown on the back of the left calf. The wounds arm more substantial bilaterally. There is a fair amount of swelling in the left calf no overt DVT there is edema present I think in the left greater than right thigh. She is supposed to go on 9/28 for CT venography. The wounds on the medial and lateral calf are worse and she has new skin breakdown posteriorly at least new for me. This is almost developing into a circumferential wound area The Apligraf was taken off last week which I agree with things are not going in the right direction a culture was done we do not have that back yet. She is on Augmentin that she started 2 days ago 9/23; dressing was changed by her nurses on Monday. In general there is no improvement in the wound areas although the area looks less angry than last week. She did get Augmentin for MSSA cultured on the 14th. She still appears to have too much swelling in the left leg even with 3 layer compression 9/30; the patient underwent her procedure on 9/28 by Dr. [facility]. She was discovered to have the common iliac vein measuring 12.2 mm but at the level of L4-L5 measured 3 mm. After stenting it measured 10 mm. 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 Rouleaux flow in the common femoral and femoral vein was observed much improved after stenting. We are using silver alginate to the wounds on the medial and lateral ankle on the left. 4 layer compression 10/7; the patient had fluid swelling around her knee and 4 layer compression. At the advice of vein and vascular this was reduced to 3 layer which she is tolerating better. We have been using silver alginate under 3 layer compression since last Friday 10/14; arrives with the areas on the left ankle looking a lot better. Inflammation in the area also a lot better. She came in for a nurse check on 10/9 10/21; continued nice improvement. Slight  improvements in surface area of both the medial and lateral wounds on the left. A lot of the satellite lesions in the weeping erythema around these from stasis dermatitis is resolved. We have been using silver alginate [name], [name] 10/28; general improvement in the entire wound areas although not a lot of change in dimensions the wound certainly looks better. There is a lot less in terms of venous inflammation. Continue silver alginate this week however look towards Hydrofera Blue next week 11/4; very adherent debris on the medial wound left wound is not as bad. We have been using silver alginate. Change to [facility] today 11/11; very adherent debris on both wound areas. She went to vein and vascular last week and follow-up they put in [place] boot on this today. He says the [facility]  Blue was adherent. Wound is definitely not as good as last week. Especially on the left there the satellite lesions look more prominent 11/18; absolutely no better. erythema on lateral aspect with tenderness. 09/30/2019 on evaluation today patient appears to actually be doing better. Dr. Dellia Nims did put her on doxycycline last week which I do believe has helped her at this point. Fortunately there is no signs of active infection at this time. No fevers, chills, nausea, vomiting, or diarrhea. I do believe he may want extend the doxycycline for 7 additional days just to ensure everything does completely cleared up the patient is in agreement with that plan. Otherwise she is going require some sharp debridement today 12/2; patient is completing a 2-week course of doxycycline. I gave her this empirically for inflammation as well as infection when I last saw her 2 weeks ago. All of this seems to be better. She is using silver alginate she has the area on the medial aspect of the larger area laterally and the 2 small satellite regions laterally above the major wound. 12/9; the patient's wound on the left  medial and left lateral calf look really quite good. We have been using silver alginate. She saw vein and vascular in follow-up on 10/09/2019. She has had a previous left greater saphenous vein ablation by Dr. Oscar La in 2016. More recently she underwent a left common iliac vein stent by Dr. 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 The swelling is improved and certainly the wounds have improved. The patient shows [place] today area on the right medial calf there is almost no wound but leaking lymphedema. She says she start this started 3 or 4 days ago. She did not traumatize it. It is not painful. She does not wear compression on that side 12/16; the patient continues to do well laterally. Medially still requiring debridement. The area on the right calf did not materialize to anything and is not currently open. We wrapped this last time. She has support stockings for that leg although I am not sure they are going to provide adequate compression 12/23; the lateral wound looks stable. Medially still requiring debridement for tightly adherent fibrinous debris. We've been using silver alginate. Surface area not any different 12/30; neither wound is any better with regards to surface and the area on the left lateral is larger. I been using silver alginate to the left lateral which look quite good last week and Sorbact to the left medial 11/11/2019. Lateral wound area actually looks better and somewhat smaller. Medial still requires a very aggressive debridement today. We have been using Sorbact on both wound areas 1/13; not much better still adherent debris bilaterally. I been using Sorbact. She has severe venous hypertension. Probably some degree of dermal fibrosis distally. I wonder whether tighter compression might help and I am going to try that today. We also need to work on the bioburden 1/20; using Sorbact. She has severe venous hypertension status post stent placement for pelvic vein  compression. We applied gentamicin last time to see if we could reduce bioburden I had some discussion with her today about the use of pentoxifylline. This is occasionally used in this setting for wounds with refractory venous insufficiency. However this interacts with Plavix. She tells me that she was put on this after stent placement for 3 months. She will call Dr. Claretha Cooper office to discuss 1/27; we are using gentamicin under Sorbact. 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 She has a  stent. Wound medially is measuring smaller this week. Laterally measuring slightly larger although she has some satellite lesions superiorly 2/3; gentamicin under Sorbact under 4-layer compression. 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 She has a stent on Plavix. Her wounds are measuring smaller this week. More substantially laterally where there is a satellite lesion superiorly. 2/10; gentamicin under Sorbac. 4-layer compression. Patient communicated with Dr. Donzetta Matters at vein and vascular in [place]. He is okay with the patient coming off Plavix I will therefore start her on pentoxifylline for a 1 month trial. In general her wounds look better today. I had some concerns about swelling in the left thigh however she measures 61.5 on the right and 63 on the mid thigh which does not suggest there is any difficulty. The patient is not describing any pain. 2/17; gentamicin under Sorbac 4-layer compression. She has been on pentoxifylline for 1 week and complains of loose stool. No nausea she is eating and drinking well 2/24; the patient apparently came in 2 days ago for a nurse visit when her wrap fell down. Both areas look a little worse this week macerated medially and satellite lesions laterally. Change to silver alginate today 3/3; wounds are larger today especially medially. She also has more swelling in her foot lower leg and I even noted some swelling in her  posterior thigh which is tender. I wonder about the patency of her stent. Fortuitously she sees Dr. Claretha Cooper group on Friday 3/10; Mrs. Hayhurst was seen by vein and vascular on 3/5. The patient underwent ultrasound. There was no evidence of thrombosis involving the IVC no evidence of thrombosis involving the right common iliac vein there is no evidence of thrombosis involving the right external iliac vein the left external vein is also patent. The right common iliac vein stent appears patent bilateral common femoral veins are compressible and appear patent. I was concerned about the left common iliac stent however it looks like this is functional. She has some edema in the posterior thigh that was tender she still has that this week. I also note they had trouble finding the pulses in her left foot and booked her for an ABI baseline in 4 weeks. She will follow up in 6 months for repeat IVC duplex. The patient stopped the pentoxifylline because of diarrhea. It does not look like that was being effective in any case. I have advised her to go back on her aspirin 81 mg tablet, vascular it also suggested this 3/17; comes in today with her wound surfaces a lot better. The excoriations from last week considerably better probably secondary to the TCA. We have been using silver alginate 3/24; comes in today with smaller wounds both medially and laterally. Both required debridement. There are 2 small satellite areas superiorly laterally. She also has a very odd bandlike area in the mid calf almost looking like there was a weakness in the wrap in a localized area. I would write this off as being this however anteriorly she has a small raised ballotable area that is very tender almost reminiscent of an abscess but there was no obvious purulent surface to it. 02/04/20 upon evaluation today patient appears to be doing fairly well in regard to her wounds today. Fortunately there is no signs of active infection at this  time. No fevers, chills, nausea, vomiting, or diarrhea. She has been tolerating the dressing changes without complication. Fortunately I feel like she is showing signs of improvement although has been sometime since have  seen her. Nonetheless the area of concern that Dr. Dellia Nims had last week where she had possibly an area of the wrap that was we can allow the leg to bulge appears to be doing significantly better today there is no signs of anything worsening. [name], [name] 4/7; the patient's wounds on her medial and lateral left leg continue to contract. We have been using a regular alginate. Last week she developed an area on the right medial lower leg which is probably a venous ulcer as well. 4/14; the wounds on her left medial and lateral lower leg continue to contract. Surface eschar. We have been using regular alginate. The area on the right medial lower leg is closed. We have been putting both legs under 4-layer contraction. The patient went back to see vein and vascular she had arterial studies done which were apparently "quite good" per the patient although I have not read their notes I have never felt she had an arterial issue. 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 This is been longstanding and refractory to exercise, leg elevation and longstanding use of compression wraps in our clinic as well as compression stockings on the times we have been able to get these to heal 4/21; we thought she actually might be close this week however she arrives in clinic with a lot of edema in her upper left calf and into her posterior thigh. This is been an intermittent problem here. She says the wrap fell down but it was replaced with a nurse visit on Monday. We are using calcium alginate to the wounds and the wound sizes there not terribly larger than last week but there is a lot more edema 4/28; again wound edges are smaller on both sides.  Her edema is better controlled than last time. She is obtained her compression pumps from [employer] although they have not been to her home to set these up. 5/5; left medial and left lateral both look stable. I am not sure the medial is any smaller. We have been using calcium alginate under 4-layer compression. oShe had an area on the right medial. This was eschared today. We have been wrapping this as well. She does not tolerate external compression stockings due to a history of various contact allergies. She has her compression pumps however the representative from the company is coming on her to show her how to use these tomorrow 5/19; patient with severe chronic venous insufficiency secondary to central venous disease. She had a stent placed in her left common iliac vein. She has done better since but still difficult to control wounds. She comes in today with nothing open on the right leg. Her areas on the left medial and left lateral are just about closed. We are using calcium alginate under 4-layer compression. She is using her external compression pumps at home She only has 15-20 support stockings. States she cannot get anything tighter than that on. Electronic Signature(s) Signed: 03/23/2020 5:06:12 PM By: Linton Ham MD Entered By: Linton Ham on 03/23/2020 13:18:20 Mcgibbon, [name] -------------------------------------------------------------------------------- Physical Exam Details Patient Name: [name], [name]. Date of Service: 03/23/2020 12:45 PM Medical Record Number: [id number] Patient Account Number: [id number] Date of Birth/Sex: 1945/12/28 (74 y.o. F) Treating RN: Cornell Barman Primary Care Provider: [name] Other Clinician: Referring Provider: [name] Treating Provider/Extender: Tito Dine in Treatment: 52 Constitutional Sitting or standing Blood Pressure is within target range for patient.. Pulse regular and within  target range for patient.Marland Kitchen Respirations regular, non- labored and within target range.. Temperature is normal and within the target range for the patient.Marland Kitchen appears in no distress. Cardiovascular Pedal pulses are palpable. Severe venous hypertension however her edema is controlled. Electronic Signature(s) Signed: 03/23/2020 5:06:12 PM By: Linton Ham MD Entered By: Linton Ham on 03/23/2020 13:19:04 [name], [name] -------------------------------------------------------------------------------- Physician Orders Details Patient Name: [name], [name]. Date of Service: 03/23/2020 12:45 PM Medical Record Number: [id number] Patient Account Number: [id number] Date of Birth/Sex: 1946/09/05 (74 y.o. F) Treating RN: Cornell Barman Primary Care Provider: [name] Other Clinician: Referring Provider: [name] Treating Provider/Extender: Tito Dine in Treatment: 25 Verbal / Phone Orders: No Diagnosis Coding Anesthetic (add to Medication List) Wound #5 Left,Medial Lower Leg o Topical Lidocaine 4% cream applied to wound bed prior to debridement (In Clinic Only). Wound #6 Left,Lateral Lower Leg o Topical Lidocaine 4% cream applied to wound bed prior to debridement (In Clinic Only). Skin Barriers/Peri-Wound Care Wound #5 Left,Medial Lower Leg o Triamcinolone Acetonide Ointment (TCA) - peri-wound Wound #6 Left,Lateral Lower Leg o Triamcinolone Acetonide Ointment (TCA) - peri-wound Primary Wound Dressing Wound #5 Left,Medial Lower Leg o Alginate Wound #6 Left,Lateral Lower Leg o Alginate Secondary Dressing Wound #5 Left,Medial Lower Leg o ABD pad Wound #6 Left,Lateral Lower Leg o ABD pad Dressing Change Frequency Wound #5 Left,Medial Lower Leg o Change dressing every week o Other: - Nurse visit Friday and Monday if needed. Wound #6 Left,Lateral Lower Leg o Change dressing every week o Other: - Nurse visit Friday and Monday  if needed. Follow-up Appointments Wound #5 Left,Medial Lower Leg o Return Appointment in 1 week. o Nurse Visit as needed Wound #6 Left,Lateral Lower Leg o Return Appointment in 1 week. o Nurse Visit as needed Edema Control Wound #5 Left,Medial Lower Leg o 3 Layer Compression System - Left Lower Extremity o Patient to wear own compression stockings - On Right o Compression Pump: Use compression pump on left lower extremity for 60 minutes, twice daily. o Compression Pump: Use compression pump on right lower extremity for 60 minutes, twice daily. Wound #6 Left,Lateral Lower Leg o 3 Layer Compression System - Left Lower Extremity STELLINA, GRANDBOIS ([id number]) o Patient to wear own compression stockings - On Right o Compression Pump: Use compression pump on left lower extremity for 60 minutes, twice daily. o Compression Pump: Use compression pump on right lower extremity for 60 minutes, twice daily. Electronic Signature(s) Signed: 03/23/2020 5:06:12 PM By: Linton Ham MD Signed: 03/24/2020 9:48:50 AM By: [name], [name], RN, CWS, [name], [name] Entered By: [name], [name], RN, CWS, Kim on 03/23/2020 13:13:34 SHEMYA, VELIC ([id number]) -------------------------------------------------------------------------------- Problem List Details Patient Name: [name], PAICE. Date of Service: 03/23/2020 12:45 PM Medical Record Number: [id number] Patient Account Number: [id number] Date of Birth/Sex: 02/26/1946 (74 y.o. F) Treating RN: Cornell Barman Primary Care Provider: [name] Other Clinician: Referring Provider: [name] Treating Provider/Extender: Tito Dine in Treatment: 67 Active Problems ICD-10 Encounter Code Description Active Date MDM Diagnosis L97.221 Non-pressure chronic ulcer of left calf limited to breakdown of skin 01/07/2019 No Yes I87.332 Chronic venous hypertension (idiopathic) with ulcer and inflammation of 12/09/2019 No Yes left lower  extremity L97.211 Non-pressure chronic ulcer of right calf limited to breakdown of skin 02/10/2020 No Yes I89.0 Lymphedema, not elsewhere classified 12/10/2018 No Yes I82.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 Date of Service: 03/23/2020 12:45 PM Medical Record Number: [id number] Patient Account Number: [id number] Date of Birth/Sex: Jun 10, 1946 (74 y.o. F) Treating RN: Cornell Barman Primary Care Provider: [name] Other Clinician: Referring Provider: [name] Treating Provider/Extender: Tito Dine in Treatment: 78 Subjective History of Present Illness (HPI) Pleasant 74 year old with history of chronic venous insufficiency. No diabetes or peripheral vascular disease. Left ABI 1.29. Questionable history of left lower extremity DVT. She developed a recurrent ulceration on her left lateral calf in December 2015, which she attributes to poor diet and subsequent lower extremity edema. She underwent endovenous laser ablation of her left greater saphenous vein in 2010. She underwent laser ablation of accessory branch of left GSV in April 2016 by Dr. Kellie Simmering at [facility]. She was previously wearing [place] boots, which she tolerated well. Tolerating 2 layer compression and cadexomer iodine. She returns to clinic for follow-up and is without new complaints. She denies any significant pain at this time. She reports persistent  pain with pressure. No claudication or ischemic rest pain. No fever or chills. No drainage. READMISSION 11/13/16; this is a 74 year old woman who is not a diabetic. She is here for a review of a painful area on her left medial lower extremity. I note that she was seen here previously last year for wound I believe to be in the same area. At that time she had undergone previously a left greater saphenous vein ablation by Dr. Kellie Simmering and she had a ablation of the anterior accessory branch of the left greater saphenous vein in March 2016. Seeing that the wound actually closed over. In reviewing the history with her today the ulcer in this area has been recurrent. She describes a biopsy of this area in 2009 that only showed stasis physiology. 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 Do not see that she has ever had arterial studies in the left leg. She tells me she has a follow-up with Dr. Kellie Simmering in roughly 10 days In any case she developed the reopening of this area roughly a month ago. On the background of this she describes rapidly increasing edema which has responded to Lasix 40 mg and metolazone 2.5 mg as well as the patient's lymph massage. She has been told she has both venous insufficiency and lymphedema but she cannot tolerate compression stockings 11/28/16; the patient saw Dr. Kellie Simmering recently. Per the patient he did arterial Dopplers in the office that did not show evidence of arterial insufficiency, per the patient he stated "treat this like an ordinary venous ulcer". She also saw her dermatologist Dr. Ronnald Ramp who felt that this was more of a vascular ulcer. In general things are improving although she arrives today with increasing bilateral lower extremity edema with weeping a  deeper fluid through the wound on the left medial leg compatible with some degree of lymphedema 12/04/16; the patient's wound is fully epithelialized but I don't think fully healed. We will do another week of depression with  Promogran and TCA however I suspect we'll be able to discharge her next week. This is a very unusual-looking wound which was initially a figure-of-eight type wound lying on its side surrounded by petechial like hemorrhage. She has had venous ablation on this side. She apparently does not have an arterial issue per Dr. Kellie Simmering. She saw her dermatologist thought it was "vascular". Patient is definitely going to need ongoing compression and I talked about this with her today she will go to elastic therapy after she leaves here next week 12/11/16; the patient's wound is not completely closed today. She has surrounding scar tissue and in further discussion with the patient it would appear that she had ulcers in this area in 2009 for a prolonged period of time ultimately requiring a punch biopsy of this area that only showed venous insufficiency. I did not previously pickup on this part of the history from the patient. 12/18/16; the patient's wound is completely epithelialized. There is no open area here. She has significant bilateral venous insufficiency with secondary lymphedema to a mild-to-moderate degree she does not have compression stockings.. She did not say anything to me when I was in the room, she told our intake nurse that she was still having pain in this area. This isn't unusual recurrent small open area. She is going to go to elastic therapy to obtain compression stockings. 12/25/16; the patient's wound is fully epithelialized. There is no open area here. The patient describes some continued episodic discomfort in this area medial left calf. However everything looks fine and healed here. She is been to elastic therapy and caught herself 15-20 mmHg stockings, they apparently  were having trouble getting 20-30 mm stockings in her size 01/22/17; this is a patient we discharged from the clinic a month ago. She has a recurrent open wound on her medial left calf. She had 15 mm support stockings. I told her I thought she needed 20-30 mm compression stockings. She tells me that she has been ill with hospitalization secondary to asthma and is been found to have severe hypokalemia likely secondary to a combination of Lasix and metolazone. This morning she noted blistering and leaking fluid on the posterior part of her left leg. She called our intake nurse urgently and we was saw her this afternoon. She has not had any real discomfort here. I don't know that she's been wearing any stockings on this leg for at least 2-3 days. ABIs in this clinic were 1.21 on the right and 1.3 on the left. She is previously seen vascular surgery who does not think that there is a peripheral arterial issue. 01/30/17; Patient arrives with no open wound on the left leg. She has been to elastic therapy and obtained 20-65mmhg below knee stockings and she has one on the right leg today. READMISSION 02/19/18; this Swatzell is a now 74 year old patient we've had in this clinic perhaps 3 times before. I had last looked at her from January 07 December 2016 with an area on the medial left leg. We discharged her on 12/25/16 however she had to be readmitted on 01/22/17 with a recurrence. I have in my notes that we discharged her on 20-30 mm stockings although she tells me she was only wearing support hose because she cannot get stockings on predominantly related to her cervical spine surgery/issues. She has had previous ablations done by vein and vascular in [place] including a great saphenous vein ablation on the left with an anterior accessory branch ablation I think both  of these were in 2016. On one of the previous visit she had a biopsy noted 2009 that was negative. She is not felt to have an arterial issue. She  is not a diabetic. She does have a history of obstructive sleep apnea hypertension asthma as well as chronic venous insufficiency and lymphedema. On this occasion she noted 2 dry scaly patch on her left leg. She tried to put lotion on this it didn't really help. There were 2 open areas.the patient has been seeing her primary physician from 02/05/18 through 02/14/18. She had Unna boots applied. The superior wound now on the lateral left leg has closed but she's had one wound that remains open on the lateral left leg. This is not the same spot as we dealt with in 2018. ABIs in this clinic were 1.3 bilaterally LYNASIA, WISECARVER ([id number]) 02/26/18; patient has a small wound on the left lateral calf. Dimensions are down. She has chronic venous insufficiency and lymphedema. 03/05/18; small open area on the left lateral calf. Dimensions are down. Tightly adherent necrotic debris over the surface of the wound which was difficult to remove. Also the dressing [over collagen] stuck to the wound surface. This was removed with some difficulty as well. Change the primary dressing to Hydrofera Blue ready 03/12/18; small open area on the left lateral calf. Comes in with tightly adherent surface eschar as well as some adherent Hydrofera Blue. 03/19/18; open area on the left lateral calf. Again adherent surface eschar as well as some adherent Hydrofera Blue nonviable subcutaneous tissue. She complained of pain all week even with the reduction from 4-3 layer compression I put on last week. Also she had an increase in her ankle and calf measurements probably related to the same thing. 03/26/18; open area on the left lateral calf. A very small open area remains here. We used silver alginate starting last week as the Hydrofera Blue seem to stick to the wound bed. In using 4-layer compression 04/02/18; the open area in the left lateral calf at some adherent slough which I removed there is no open area here. We are able to  transition her into her own compression stocking. Truthfully I think this is probably his support hose. However this does not maintain skin integrity will be limited. She cannot put over the toe compression stockings on because of neck problems hand problems etc. She is allergic to the lining layer of juxta lites. We might be forced to use extremitease stocking should this fail READMIT 11/24/2018 Patient is now a 74 year old woman who is not a diabetic. She has been in this clinic on at least 3 previous occasions largely with recurrent wounds on her left leg secondary to chronic venous insufficiency with secondary lymphedema. Her situation is complicated by inability to get stockings on and an allergy to neoprene which is apparently a component and at least juxta lites and other stockings. As a result she really has not been wearing any stockings on her legs. She tells [place] that roughly 2 or 3 weeks ago she started noticing a stinging sensation just above her ankle on the left medial aspect. She has been diagnosed with pseudogout and she wondered whether this was what she was experiencing. She tried to dress this with something she bought at the store however subsequently it pulled skin off and now she has an open wound that is not improving. She has been using Vaseline gauze with a cover bandage. She saw her primary doctor last week who put  an Haematologist on her. ABIs in this clinic was 1.03 on the left 2/12; the area is on the left medial ankle. Odd-looking wound with what looks to be surface epithelialization but a multitude of small petechial openings. This clearly not closed yet. We have been using silver alginate under 3 layer compression with TCA 2/19; the wound area did not look quite as good this week. Necrotic debris over the majority of the wound surface which required debridement. She continues to have a multitude of what looked to be small petechial openings. She reminds [place] that she had a  biopsy on this initially during her first outbreak in 2015 in [place] dermatology. She expresses concern about this being a possible melanoma. 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 I assured her that this does not look anything like melanoma. Except for the petechial reaction it does look like a venous insufficiency area and she certainly has evidence of this on both sides 2/26; a difficult area on the left medial ankle. The patient clearly has chronic venous hypertension with some degree of lymphedema. The odd thing about the area is the small petechial hemorrhages. I am not really sure how to explain this. This was present last time and this is not a compression injury. We have been using Hydrofera Blue which I changed to last week 3/4; still using Hydrofera Blue. Aggressive debridement today. She does not have known arterial issues. She has seen Dr. Kellie Simmering at [facility] vein [facility] vascular and and has an ablation on the left. [Anterior accessory branch of the greater saphenous]. From what I remember they did not feel she had an arterial issue. The patient has had this area biopsied in 2009 at [facility] dermatology and by her recollection they said this was "stasis". She is also follow-up with dermatology locally who thought that this was more of a vascular issue 3/11; using Hydrofera Blue. Aggressive debridement today. She does not have an arterial issue. We are using 3 layer compression although we may need to go to 4. The patient has been in for multiple changes to her wrap since I last saw her a week ago. She says that the area was leaking. I do not have too much more information on what was found 01/19/19 on evaluation today patient was actually being seen for a nurse visit when unfortunately she had the area on her left lateral lower extremity as well as weeping from the right lower extremity that  became apparent. Therefore we did end up actually seeing her for a full visit with myself. She is having some pain at this site as well but fortunately nothing too significant at this point. No fevers, chills, nausea, or vomiting noted at this time. 3/18-Patient is back to the clinic with the left leg venous leg ulcer, the ulcer is larger in size, has a surface that is densely adherent with fibrinous tissue, the Hydrofera Blue was used but is densely adherent and there was difficulty in removing it. The right lower extremity was also wrapped for weeping edema. Patient has a new area over the left lateral foot above the malleolus that is small and appears to have no debris with intact surrounding skin. Patient is on increased dose of Lasix also as a means to edema management 3/25; the patient has a nonhealing venous ulcer on the medial left leg and last week developed a smaller area on the lateral left  calf. We have been using Hydrofera Blue with a contact layer. 4/1; no major change in these wounds areas. Left medial and more recently left lateral calf. I tried Iodoflex last week to aid in debridement she did not tolerate this. She stated her pain was terrible all week. She took the top layer of the 4 layer compression off. 4/8; the patient actually looks somewhat better in terms of her more prominent left lateral calf wound. There is some healthy looking tissue here. She is still complaining of a lot of discomfort. 4/15; patient in a lot of pain secondary to sciatica. She is on a prednisone taper prescribed by her primary physician. She has the 2 areas one on the left medial and more recently a smaller area on the left lateral calf. Both of these just above the malleoli 4/22; her back pain is better but she still states she is very uncomfortable and now feels she is intolerant to the [employer]. No real change in the wounds we have been using Sorbact. She has been previously intolerant to Iodoflex.  There is not a lot of option about what we can use to debride this wound under compression that she no doubt needs. sHe states Ultram no longer works for her pain 4/29; no major change in the wounds slightly increased depth. Surface on the original medial wound perhaps somewhat improved however the more recent area on the lateral left ankle is 100% covered in very adherent debris we have been using Sorbact. She tolerates 4 layer compression well and her edema control is a lot better. She has not had to come in for a nurse check 5/6; no major change in the condition of the wounds. She did consent to debridement today which was done with some difficulty. Continuing Sorbact. She did not tolerate Iodoflex. She was in for a check of her compression the day after we wrapped her last week this was adjusted but nothing much was found 5/13; no major change in the condition or area of the wounds. I was able to get a fairly aggressive debridement done on the lateral left leg wound. Even using Sorbact under compression. She came back in on Friday to have the wrap changed. She says she felt uncomfortable on the lateral aspect of her ankle. She has a long history of chronic venous insufficiency including previous ablation surgery on this side. 5/20-Patient returns for wounds on left leg with both wounds covered in slough, with the lateral leg wound larger in size, she has been in 3 layer compression and felt more comfortable, she describes pain in ankle, in leg and pins and needles in foot, and is about to try Pamelor for this 6/3; wounds on the left lateral and left medial leg. The area medially which is the most recent of the 2 seems to have had the largest increase in Healy, DONDI CLARIDA. ([id number]) dimensions. We have been using Sorbac to try and debride the surface. She has been to see orthopedics they apparently did a plain x-ray that was indeterminant. Diagnosed her with neuropathy and they have ordered an MRI  to determine if there is underlying osteomyelitis. This was not high on my thought list but I suppose it is prudent. We have advised her to make an appointment with vein and vascular in [place]. She has a history of a left greater saphenous and accessory vein ablations I wonder if there is anything else that can be done from a surgical point of view to help  in these difficult refractory wounds. We have previously healed this wound on one occasion but it keeps on reopening [medial side] 6/10; deep tissue culture I did last week I think on the left medial wound showed both moderate E. coli and moderate staph aureus [MSSA]. She is going to require antibiotics and I have chosen Augmentin. We have been using Sorbact and we have made better looking wound surface on both sides but certainly no improvement in wound area. She was back in last Friday apparently for a dressing changes the wrap was hurting her outer left ankle. She has not managed to get a hold of vein and vascular in [place]. We are going to have to make her that appointment 6/17; patient is tolerating the Augmentin. She had an MRI that I think was ordered by orthopedic surgeon this did not show osteomyelitis or an abscess did suggest cellulitis. We have been using Sorbact to the lateral and medial ankles. We have been trying to arrange a follow-up appointment with vein and vascular in [place] or did her original ablations. We apparently an area sent the request to vein and vascular in [facility] 6/24; patient has completed the Augmentin. We do not yet have a vein and vascular appointment in [place]. I am not sure what the issue is here we have asked her to call tomorrow. We are using Sorbact. Making some improvements and especially the medial wound. Both surfaces however look better medial and lateral. 7/1; the patient has been in contact with vein and vascular in [place] but has not yet received an appointment. Using Sorbact we  have gradually improve the wound surface with no improvement in surface area. She is approved for Apligraf but the wound surface still is not completely viable. She has not had to come in for a dressing change 7/8; the patient has an appointment with vein and vascular on 7/31 which is a Friday afternoon. She is concerned about getting back here for [place] to dress her wounds. I think it is important to have them goal for her venous reflux/history of ablations etc. to see if anything else can be done. She apparently tested positive for 1 of the blood tests with regards to lupus and saw a rheumatologist. He has raised the issue of vasculitis again. I have had this thought in the past however the evidence seems overwhelming that this is a venous reflux etiology. If the rheumatologist tells me there is clinical and laboratory investigation is positive for lupus I will rethink this. 7/15; the patient's wound surfaces are quite a bit better. The medial area which was her original wound now has no depth although the lateral wound which was the more recent area actually appears larger. Both with viable surfaces which is indeed better. Using Sorbact. I wanted to use Apligraf on her however there is the issue of the vein and vascular appointment on 7/31 at 2:00 in the afternoon which would not allow her to get back to be rewrapped and they would no doubt remove the graft 7/22; the patient's wound surfaces have moderate amount of debris although generally look better. The lateral one is larger with 2 small satellite areas superiorly. We are waiting for her vein and vascular appointment on 7/31. She has been approved for Apligraf which I would like to use after th 7/29; wound surfaces have improved no debridement is required we have been using Sorbact. She sees vein and vascular on Friday with this so question of whether anything can be done  to lessen the likelihood of recurrence and/or speed the healing of these  areas. She is already had previous ablations. She no doubt has severe venous hypertension 8/5-Patient returns at 1 week, she was in [name] for 3 days by her podiatrist, we have been using so backed to the wound, she has increased pain in both the wounds on the left lower leg especially the more distal one on the lateral aspect 8/12-Patient returns at 1 week and she is agreeable to having debridement in both wounds on her left leg today. We have been using Sorbact, and vascular studies were reviewed at last visit 8/19; the patient arrives with her wounds fairly clean and no debridement is required. We have used Sorbact which is really done a nice job in cleaning up these very difficult wound surfaces. The patient saw Dr. Donzetta Matters of vascular surgery on 7/31. He did not feel that there was an arterial component. He felt that her treated greater saphenous vein is adequately addressed and that the small saphenous vein did not appear to be involved significantly. She was also noted to have deep venous reflux which is not treatable. Dr. Donzetta Matters mentioned the possibility of a central obstructive component leading to reflux and he offered her central venography. She wanted to discuss this or think about it. I have urged her to go ahead with this. She has had recurrent difficult wounds in these areas which do heal but after months in the clinic. If there is anything that can be done to reduce the likelihood of this I think it is worth it. 9/2 she is still working towards getting follow-up with Dr. Donzetta Matters to schedule her CT. Things are quite a bit worse venography. I put Apligraf on 2 weeks ago on both wounds on the medial and lateral part of her left lower leg. She arrives in clinic today with 3 superficial additional wounds above the area laterally and one below the wound medially. She describes a lot of discomfort. I think these are probably wrapped injuries. Does not look like she has cellulitis. 07/20/2019 on  evaluation today patient appears to be doing somewhat poorly in regard to her lower extremity ulcers. She in fact showed signs of erythema in fact we may even be dealing with an infection at this time. Unfortunately I am unsure if this is just infection or if indeed there may be some allergic reaction that occurred as a result of the Apligraf application. With that being said that would be unusual but nonetheless not impossible in this patient is one who is unfortunately allergic to quite a bit. Currently we have been using the Sorbact which seems to do as well as anything for her. I do think we may want to obtain a culture today to see if there is anything showing up there that may need to be addressed. 9/16; noted that last week the wounds look worse in 1 week follow-up of the Apligraf. Using Sorbact as of 2 days ago. She arrives with copious amounts of drainage and new skin breakdown on the back of the left calf. The wounds arm more substantial bilaterally. There is a fair amount of swelling in the left calf no overt DVT there is edema present I think in the left greater than right thigh. She is supposed to go on 9/28 for CT venography. The wounds on the medial and lateral calf are worse and she has new skin breakdown posteriorly at least new for me. This is almost developing into  a circumferential wound area The Apligraf was taken off last week which I agree with things are not going in the right direction a culture was done we do not have that back yet. She is on Augmentin that she started 2 days ago 9/23; dressing was changed by her nurses on Monday. In general there is no improvement in the wound areas although the area looks less angry than last week. She did get Augmentin for MSSA cultured on the 14th. She still appears to have too much swelling in the left leg even with 3 layer compression 9/30; the patient underwent her procedure on 9/28 by Dr. Donzetta Matters at vascular and vein specialist. She was  discovered to have the common iliac vein measuring 12.2 mm but at the level of L4-L5 measured 3 mm. After stenting it measured 10 mm. 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 Rouleaux flow in the common femoral and femoral vein was observed much improved after stenting. We are using silver alginate to the wounds on the medial and lateral ankle on the left. 4 layer compression 10/7; the patient had fluid swelling around her knee and 4 layer compression. At the advice of vein and vascular this was reduced to 3 layer which she is tolerating better. We have been using silver alginate under 3 layer compression since last Friday 10/14; arrives with the areas on the left ankle looking a lot better. Inflammation in the area also a lot better. She came in for a nurse check on 10/9 10/21; continued nice improvement. Slight improvements in surface area of both the medial and lateral wounds on the left. A lot of the satellite lesions in the weeping erythema around these from stasis dermatitis is resolved. We have been using silver alginate [name], [name] 10/28; general improvement in the entire wound areas although not a lot of change in dimensions the wound certainly looks better. There is a lot less in terms of venous inflammation. Continue silver alginate this week however look towards Hydrofera Blue next week 11/4; very adherent debris on the medial wound left wound is not as bad. We have been using silver alginate. Change to [facility] today 11/11; very adherent debris on both wound areas. She went to vein and vascular last week and follow-up they put in Foster boot on this today. He says the [employer] was adherent. Wound is definitely not as good as last week. Especially on the left there the satellite lesions look more prominent 11/18; absolutely no better. erythema on lateral aspect with tenderness. 09/30/2019 on evaluation today patient appears to actually be  doing better. Dr. Dellia Nims did put her on doxycycline last week which I do believe has helped her at this point. Fortunately there is no signs of active infection at this time. No fevers, chills, nausea, vomiting, or diarrhea. I do believe he may want extend the doxycycline for 7 additional days just to ensure everything does completely cleared up the patient is in agreement with that plan. Otherwise she is going require some sharp debridement today 12/2; patient is completing a 2-week course of doxycycline. I gave her this empirically for inflammation as well as infection when I last saw her 2 weeks ago. All of this seems to be better. She is using silver alginate she has the area on the medial aspect of the larger area laterally and the 2 small satellite regions laterally above the major wound. 12/9; the patient's wound on the left medial and left  lateral calf look really quite good. We have been using silver alginate. She saw vein and vascular in follow-up on 10/09/2019. She has had a previous left greater saphenous vein ablation by Dr. Oscar La in 2016. More recently she underwent a left common iliac vein stent by Dr. 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 The swelling is improved and certainly the wounds have improved. The patient shows [place] today area on the right medial calf there is almost no wound but leaking lymphedema. She says she start this started 3 or 4 days ago. She did not traumatize it. It is not painful. She does not wear compression on that side 12/16; the patient continues to do well laterally. Medially still requiring debridement. The area on the right calf did not materialize to anything and is not currently open. We wrapped this last time. She has support stockings for that leg although I am not sure they are going to provide adequate compression 12/23; the lateral wound looks stable. Medially still requiring debridement for tightly adherent fibrinous debris. We've been  using silver alginate. Surface area not any different 12/30; neither wound is any better with regards to surface and the area on the left lateral is larger. I been using silver alginate to the left lateral which look quite good last week and Sorbact to the left medial 11/11/2019. Lateral wound area actually looks better and somewhat smaller. Medial still requires a very aggressive debridement today. We have been using Sorbact on both wound areas 1/13; not much better still adherent debris bilaterally. I been using Sorbact. She has severe venous hypertension. Probably some degree of dermal fibrosis distally. I wonder whether tighter compression might help and I am going to try that today. We also need to work on the bioburden 1/20; using Sorbact. She has severe venous hypertension status post stent placement for pelvic vein compression. We applied gentamicin last time to see if we could reduce bioburden I had some discussion with her today about the use of pentoxifylline. This is occasionally used in this setting for wounds with refractory venous insufficiency. However this interacts with Plavix. She tells me that she was put on this after stent placement for 3 months. She will call Dr. Claretha Cooper office to discuss 1/27; we are using gentamicin under Sorbact. 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 She has a stent. Wound medially is measuring smaller this week. Laterally measuring slightly larger although she has some satellite lesions superiorly 2/3; gentamicin under Sorbact under 4-layer compression. 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 She has a stent on Plavix. Her wounds are measuring smaller this week. More substantially laterally where there is a satellite lesion superiorly. 2/10; gentamicin under Sorbac. 4-layer compression. Patient communicated with Dr. Donzetta Matters at [facility] in [place]. He is okay with the patient coming off  Plavix I will therefore start her on pentoxifylline for a 1 month trial. In general her wounds look better today. I had some concerns about swelling in the left thigh however she measures 61.5 on the right and 63 on the mid thigh which does not suggest there is any difficulty. The patient is not describing any pain. 2/17; gentamicin under Sorbac 4-layer compression. She has been on pentoxifylline for 1 week and complains of loose stool. No nausea she is eating and drinking well 2/24; the patient apparently came in 2 days ago for a nurse visit when her wrap fell down. Both areas look a little worse this  week macerated medially and satellite lesions laterally. Change to silver alginate today 3/3; wounds are larger today especially medially. She also has more swelling in her foot lower leg and I even noted some swelling in her posterior thigh which is tender. I wonder about the patency of her stent. Fortuitously she sees Dr. Claretha Cooper group on Friday 3/10; Mrs. Capo was seen by [facility] on 3/5. The patient underwent ultrasound. There was no evidence of thrombosis involving the IVC no evidence of thrombosis involving the right common iliac vein there is no evidence of thrombosis involving the right external iliac vein the left external vein is also patent. The right common iliac vein stent appears patent bilateral common femoral veins are compressible and appear patent. I was concerned about the left common iliac stent however it looks like this is functional. She has some edema in the posterior thigh that was tender she still has that this week. I also note they had trouble finding the pulses in her left foot and booked her for an ABI baseline in 4 weeks. She will follow up in 6 months for repeat IVC duplex. The patient stopped the pentoxifylline because of diarrhea. It does not look like that was being effective in any case. I have advised her to go back on her aspirin 81 mg tablet, vascular  it also suggested this 3/17; comes in today with her wound surfaces a lot better. The excoriations from last week considerably better probably secondary to the TCA. We have been using silver alginate 3/24; comes in today with smaller wounds both medially and laterally. Both required debridement. There are 2 small satellite areas superiorly laterally. She also has a very odd bandlike area in the mid calf almost looking like there was a weakness in the wrap in a localized area. I would write this off as being this however anteriorly she has a small raised ballotable area that is very tender almost reminiscent of an abscess but there was no obvious purulent surface to it. 02/04/20 upon evaluation today patient appears to be doing fairly well in regard to her wounds today. Fortunately there is no signs of active infection at this time. No fevers, chills, nausea, vomiting, or diarrhea. She has been tolerating the dressing changes without complication. Fortunately I feel like she is showing signs of improvement although has been sometime since have seen her. Nonetheless the area of concern that Dr. Dellia Nims had last week where she had possibly an area of the wrap that was we can allow the leg to bulge appears to be doing significantly better today there is no signs of anything worsening. ARRIANNA, YORKE ([id number]) 4/7; the patient's wounds on her medial and lateral left leg continue to contract. We have been using a regular alginate. Last week she developed an area on the right medial lower leg which is probably a venous ulcer as well. 4/14; the wounds on her left medial and lateral lower leg continue to contract. Surface eschar. We have been using regular alginate. The area on the right medial lower leg is closed. We have been putting both legs under 4-layer contraction. The patient went back to see vein and vascular she had arterial studies done which were apparently "quite good" per the patient although  I have not read their notes I have never felt she had an arterial issue. 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 This is been longstanding and refractory to exercise, leg elevation and longstanding use  of compression wraps in our clinic as well as compression stockings on the times we have been able to get these to heal 4/21; we thought she actually might be close this week however she arrives in clinic with a lot of edema in her upper left calf and into her posterior thigh. This is been an intermittent problem here. She says the wrap fell down but it was replaced with a nurse visit on Monday. We are using calcium alginate to the wounds and the wound sizes there not terribly larger than last week but there is a lot more edema 4/28; again wound edges are smaller on both sides. Her edema is better controlled than last time. She is obtained her compression pumps from [employer] although they have not been to her home to set these up. 5/5; left medial and left lateral both look stable. I am not sure the medial is any smaller. We have been using calcium alginate under 4-layer compression. She had an area on the right medial. This was eschared today. We have been wrapping this as well. She does not tolerate external compression stockings due to a history of various contact allergies. She has her compression pumps however the representative from the company is coming on her to show her how to use these tomorrow 5/19; patient with severe chronic venous insufficiency secondary to central venous disease. She had a stent placed in her left common iliac vein. She has done better since but still difficult to control wounds. She comes in today with nothing open on the right leg. Her areas on the left medial and left lateral are just about closed. We are using calcium alginate under 4-layer compression. She is using her external compression pumps at home She  only has 15-20 support stockings. States she cannot get anything tighter than that on. Objective Constitutional Sitting or standing Blood Pressure is within target range for patient.. Pulse regular and within target range for patient.Marland Kitchen Respirations regular, non- labored and within target range.. Temperature is normal and within the target range for the patient.Marland Kitchen appears in no distress. Vitals Time Taken: 12:40 PM, Height: 63 in, Weight: 224.7 lbs, BMI: 39.8, Temperature: 99.1 F, Pulse: 79 bpm, Respiratory Rate: 16 breaths/min, Blood Pressure: 137/75 mmHg. Cardiovascular Pedal pulses are palpable. Severe venous hypertension however her edema is controlled. Integumentary (Hair, Skin) Wound #5 status is Open. Original cause of wound was Gradually Appeared. The wound is located on the Left,Medial Lower Leg. The wound measures 1cm length x 1.1cm width x 0.1cm depth; 0.864cm^2 area and 0.086cm^3 volume. There is Fat Layer (Subcutaneous Tissue) Exposed exposed. There is no tunneling or undermining noted. There is a medium amount of serous drainage noted. The wound margin is flat and intact. There is medium (34-66%) pink granulation within the wound bed. There is a medium (34-66%) amount of necrotic tissue within the wound bed including Eschar and Adherent Slough. Wound #6 status is Open. Original cause of wound was Gradually Appeared. The wound is located on the Left,Lateral Lower Leg. The wound measures 0.4cm length x 0.6cm width x 0.1cm depth; 0.188cm^2 area and 0.019cm^3 volume. There is Fat Layer (Subcutaneous Tissue) Exposed exposed. There is no tunneling or undermining noted. There is a medium amount of serous drainage noted. The wound margin is flat and intact. There is medium (34-66%) pink granulation within the wound bed. There is a medium (34-66%) amount of necrotic tissue within the wound bed including Eschar and Adherent Slough. 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 ([id number]) Procedures Wound #6 Pre-procedure diagnosis of Wound #6 is a Venous Leg Ulcer located on the Left,Lateral Lower Leg .Severity of Tissue Pre Debridement is: Limited to breakdown of skin. There was a Excisional Skin/Subcutaneous Tissue Debridement with a total area of 0.24 sq cm performed by [name] Dillon, MD. With the following instrument(s): Curette to remove Viable and Non-Viable tissue/material. Material removed includes Eschar, Subcutaneous Tissue, Skin: Dermis, and Fibrin/Exudate after achieving pain control using Lidocaine. No specimens were taken. A time out was conducted at 13:08, prior to the start of the procedure. A Minimum amount of bleeding was controlled with Pressure. The procedure was tolerated well. Post Debridement Measurements: 1cm length x 1cm width x 0.1cm depth; 0.079cm^3 volume. Character of Wound/Ulcer Post Debridement is stable. Severity of Tissue Post Debridement is: Fat layer exposed. Post procedure Diagnosis Wound #6: Same as Pre-Procedure Plan Anesthetic (add to Medication List): Wound #5 Left,Medial Lower Leg: Topical Lidocaine 4% cream applied to wound bed prior to debridement (In Clinic Only). Wound #6 Left,Lateral Lower Leg: Topical Lidocaine 4% cream applied to wound bed prior to debridement (In Clinic Only). Skin Barriers/Peri-Wound Care: Wound #5 Left,Medial Lower Leg: Triamcinolone Acetonide Ointment (TCA) - peri-wound Wound #6 Left,Lateral Lower Leg: Triamcinolone Acetonide Ointment (TCA) - peri-wound Primary Wound Dressing: Wound #5 Left,Medial Lower Leg: Alginate Wound #6 Left,Lateral  Lower Leg: Alginate Secondary Dressing: Wound #5 Left,Medial Lower Leg: ABD pad Wound #6 Left,Lateral Lower Leg: ABD pad Dressing Change Frequency: Wound #5 Left,Medial Lower Leg: Change dressing every week Other: - Nurse visit Friday and Monday if needed. Wound #6 Left,Lateral Lower Leg: Change dressing every week Other: - Nurse visit Friday and Monday if needed. Follow-up Appointments: Wound #5 Left,Medial Lower Leg: Return Appointment in 1 week. Nurse Visit as needed Wound #6 Left,Lateral Lower Leg: Return Appointment in 1 week. Nurse Visit as needed Edema Control: Wound #5 Left,Medial Lower Leg: 3 Layer Compression System - Left Lower Extremity Patient to wear own compression stockings - On Right Compression Pump: Use compression pump on left lower extremity for 60 minutes, twice daily. Compression Pump: Use compression pump on right lower extremity for 60 minutes, twice daily. Wound #6 Left,Lateral Lower Leg: 3 Layer Compression System - Left Lower Extremity Patient to wear own compression stockings - On Right Compression Pump: Use compression pump on left lower extremity for 60 minutes, twice daily. Compression Pump: Use compression pump on right lower extremity for 60 minutes, twice daily. 1. We continued with the calcium alginate to both the lateral and medial left ankle. This is just about closed 2. I reduced her compression to 3 layer as she found that 4-layer compression with her external compression pumps to be uncomfortable. 3. I have asked her to increase the pressure on the compression pumps from 20 to 30 mmHg. KEILEE, LEMMON ([id number]) 4. Unfortunately we only have a support stocking for the right leg I have asked her to watch her edema in the ankle where the skin is fibrosed. This will not tolerate excessive amounts of edema. 5. The patient is following with [facility] with regards to the central venous disease Dr. Donzetta Matters Electronic  Signature(s) Signed: 03/23/2020 1:33:09 PM By: Linton Ham  MD Entered By: Linton Ham on 03/23/2020 13:33:09 Voit, [name] -------------------------------------------------------------------------------- SuperBill Details Patient Name: AJADA, GHALI. Date of Service: 03/23/2020 Medical Record Number: [id number] Patient Account Number: [id number] Date of Birth/Sex: 09/17/1946 (74 y.o. 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 Facility Procedures CPT4 Code Description: [id number] - DEB SUBQ TISSUE 20 SQ CM/< Modifier: Quantity: 1 CPT4 Code Description: ICD-10 Diagnosis Description I87.332 Chronic venous hypertension (idiopathic) with ulcer and inflammation of l I89.0 Lymphedema, not elsewhere classified I82.592 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  lowe Modifier: eft lower extremity r extremity Quantity: Physician Procedures CPT4 Code Description: [id number] [id number] - WC PHYS SUBQ TISS 20 SQ CM Modifier: Quantity: 1 CPT4 Code Description: ICD-10 Diagnosis Description I87.332 Chronic venous hypertension (idiopathic) with ulcer and inflammation of l I89.0 Lymphedema, not elsewhere classified I82.592 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  lowe Modifier: eft lower extremity r extremity Quantity: Electronic  Signature(s) Signed: 03/23/2020 5:06:12 PM By: Linton Ham MD Entered By: Linton Ham on 03/23/2020 13:34:33

## 2020-03-30 ENCOUNTER — Encounter: Payer: Medicare Other | Admitting: Internal Medicine

## 2020-03-30 ENCOUNTER — Other Ambulatory Visit: Payer: Self-pay

## 2020-03-30 DIAGNOSIS — L97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d: Secondary | ICD-10-CM | POA: Diagnosis not present

## 2020-03-30 DIAGNOSIS — I890 Lymphedema, not elsewhere classified: Secondary | ICD-10-CM | POA: Diagnosis not present

## 2020-03-30 DIAGNOSIS — L97221 Non-pressure chronic ulcer of left calf limited to breakdown of skin: Secondary | ICD-10-CM | POA: Diagnosis not present

## 2020-03-30 DIAGNOSIS — I87332 Chronic venous hypertension (idiopathic) with ulcer and inflammation of left lower extremity: Secondary | ICD-10-CM | POA: Diagnosis not present

## 2020-03-31 NOTE — Progress Notes (Signed)
[name], Amber ([id number]) Visit Report for 03/30/2020 Arrival Information Details Patient Name: [name], [name]. Date of Service: 03/30/2020 12:45 PM Medical Record Number: [id number] Patient Account Number: [id number] Date of Birth/Sex: [birth year] (74 y.o. F) Treating RN: Cornell Barman Primary Care Llewyn Heap: [name] Other Clinician: Referring Daiquan Resnik: [name] Treating Shariah Assad/Extender: Beverly Gust in Treatment: 68 Visit Information History Since Last Visit Added or deleted any medications: No Patient Arrived: Ambulatory Any new allergies or adverse reactions: No Arrival Time: 12:50 Had a fall or experienced change in No Accompanied By: self activities of daily living that may affect Transfer Assistance: None risk of falls: Patient Identification Verified: Yes Signs or symptoms of abuse/neglect since last visito No Secondary Verification Process Completed: Yes Hospitalized since last visit: No Patient Requires Transmission-Based No Implantable device outside of the clinic excluding No Precautions: cellular tissue based products placed in the center Patient Has Alerts: Yes since last visit: Patient Alerts: Patient on Blood Has Dressing in Place as Prescribed: Yes Thinner Has Compression in Place as Prescribed: Yes aspirin 81 Pain Present Now: No Electronic Signature(s) Signed: 03/31/2020 11:04:59 AM By: Lorine Bears RCP, RRT, CHT Entered By: Lorine Bears on 03/30/2020 12:51:21 Slight, [name] -------------------------------------------------------------------------------- Compression Therapy Details Patient Name: [name], Amber. Date of Service: 03/30/2020 12:45 PM Medical Record Number: [id number] Patient Account Number: [id number] Date of Birth/Sex: [birth year] (74 y.o. F) Treating RN: Cornell Barman Primary Care Marvelyn Bouchillon: [name] Other Clinician: Referring Jacy Brocker: [name] Treating  Symphany Fleissner/Extender: Beverly Gust in Treatment: 68 Compression Therapy Performed for Wound Assessment: Wound #5 Left,Medial Lower Leg Performed By: Clinician Montey Hora, RN Compression Type: Four Layer Pre Treatment ABI: 1 Post Procedure Diagnosis Same as Pre-procedure Electronic Signature(s) Signed: 03/30/2020 5:27:22 PM By: [name], [name], RN, CWS, [name], [name] Entered By: [name], [name], RN, CWS, Kim on 03/30/2020 13:23:17 [name] -------------------------------------------------------------------------------- Encounter Discharge Information Details Patient Name: [name], Amber. Date of Service: 03/30/2020 12:45 PM Medical Record Number: [id number] Patient Account Number: [id number] Date of Birth/Sex: 1946-01-01 (74 y.o. F) Treating RN: Cornell Barman Primary Care Landon Truax: [name] Other Clinician: Referring [name] [name]: [name] Treating Briasia Flinders/Extender: Beverly Gust in Treatment: 2 Encounter Discharge Information Items Discharge Condition: Stable Ambulatory Status: Ambulatory Discharge Destination: Home Transportation: Private Auto Accompanied By: self Schedule Follow-up Appointment: Yes Clinical Summary of Care: Electronic Signature(s) Signed: 03/30/2020 5:27:22 PM By: [name], [name], RN, CWS, [name], [name] Entered By: [name], [name], RN, CWS, Kim on 03/30/2020 13:25:59 [name] -------------------------------------------------------------------------------- Lower Extremity Assessment Details Patient Name: [name], Amber. Date of Service: 03/30/2020 12:45 PM Medical Record Number: [id number] Patient Account Number: [id number] Date of Birth/Sex: 1946-05-03 (74 y.o. F) Treating RN: Army Melia Primary Care Latif Nazareno: [name] Other Clinician: Referring Tearsa Kowalewski: [name] Treating Kaden Dunkel/Extender: Beverly Gust in Treatment: 68 Edema Assessment Assessed: [Left: No] [Right: No] Edema: [Left:  Ye] [Right: s] Calf Left: Right: Point of Measurement: 33 cm From Medial Instep 51 cm cm Ankle Left: Right: Point of Measurement: 10 cm From Medial Instep 22 cm cm Vascular Assessment Pulses: Dorsalis Pedis Palpable: [Left:Yes] Electronic Signature(s) Signed: 03/30/2020 3:54:29 PM By: Army Melia Entered By: Army Melia on 03/30/2020 12:59:19 Caperton, [name] -------------------------------------------------------------------------------- Multi Wound Chart Details Patient Name: [name], Amber. Date of Service: 03/30/2020 12:45 PM Medical Record Number: [id number] Patient Account Number: [id number] Date of Birth/Sex: [birth year] (74 y.o. F) Treating RN: Cornell Barman Primary Care Jorene [name]: [name] Other Clinician: Referring Berdell Hostetler: Danise Mina  JAVIER Treating Takira Sherrin/Extender: Beverly Gust in Treatment: 54 Vital Signs Height(in): 63 Pulse(bpm): 81 Weight(lbs): 224.7 Blood Pressure(mmHg): 144/63 Body Mass Index(BMI): 40 Temperature(F): 98.2 Respiratory Rate(breaths/min): 18 Photos: [N/A:N/A] Wound Location: Left, Medial Lower Leg Left, Lateral Lower Leg N/A Wounding Event: Gradually Appeared Gradually Appeared N/A Primary Etiology: Lymphedema Venous Leg Ulcer N/A Comorbid History: Cataracts, Asthma, Sleep Apnea, Cataracts, Asthma, Sleep Apnea, N/A Deep Vein Thrombosis, Deep Vein Thrombosis, Hypertension, Peripheral Venous Hypertension, Peripheral Venous Disease, Osteoarthritis, Received Disease, Osteoarthritis, Received Chemotherapy, Received Radiation Chemotherapy, Received Radiation Date Acquired: 11/19/2018 01/19/2019 N/A Weeks of Treatment: 68 62 N/A Wound Status: Open Open N/A Measurements L x W x D (cm) 1.9x1.5x0.1 2x2x0.1 N/A Area (cm) : 2.238 3.142 N/A Volume (cm) : 0.224 0.314 N/A % Reduction in Area: 73.80% -1328.20% N/A % Reduction in Volume: 73.80% -1327.30% N/A Classification: Full Thickness Without Exposed Full Thickness  Without Exposed N/A Support Structures Support Structures Exudate Amount: Medium Medium N/A Exudate Type: Serous Serous N/A Exudate Color: amber amber N/A Wound Margin: Flat and Intact Flat and Intact N/A Granulation Amount: Medium (34-66%) Medium (34-66%) N/A Granulation Quality: Pink Pink N/A Necrotic Amount: Medium (34-66%) Medium (34-66%) N/A Necrotic Tissue: Eschar, Adherent Slough Eschar, Adherent Slough N/A Exposed Structures: Fat Layer (Subcutaneous Tissue) Fat Layer (Subcutaneous Tissue) N/A Exposed: Yes Exposed: Yes Fascia: No Fascia: No Tendon: No Tendon: No Muscle: No Muscle: No Joint: No Joint: No Bone: No Bone: No Epithelialization: None Small (1-33%) N/A Treatment Notes Electronic Signature(s) Signed: 03/30/2020 5:27:22 PM By: [name], [name], RN, CWS, [name], [name] Entered By: [name], [name], RN, CWS, Kim on 03/30/2020 13:18:23 NELSIE, BERKE ([name] -------------------------------------------------------------------------------- Multi-Disciplinary Care Plan Details Patient Name: BRIDGITTE, BASELICE. Date of Service: 03/30/2020 12:45 PM Medical Record Number: [id number] Patient Account Number: [id number] Date of Birth/Sex: May 06, 1946 (74 y.o. F) Treating RN: Cornell Barman Primary Care Lashay [name]: [name] Other Clinician: Referring Sharlee Rufino: [name] Treating Ethan Kasperski/Extender: Beverly Gust in Treatment: 68 Active Inactive Medication Nursing Diagnoses: Knowledge deficit related to medication safety: actual or potential Goals: Patient/caregiver will demonstrate understanding of new oral/IV medications prescribed at the [facility] (topical prescriptions are covered under the skin breakdown problem) Date Initiated: 12/16/2019 Target Resolution Date: 01/13/2020 Goal Status: Active Interventions: Assess for medication contraindications each visit where new medications are prescribed Treatment Activities: New medication  prescribed at [facility] : 12/16/2019 Notes: Soft Tissue Infection Nursing Diagnoses: Impaired tissue integrity Goals: Patient's soft tissue infection will resolve Date Initiated: 12/10/2018 Target Resolution Date: 01/09/2019 Goal Status: Active Interventions: Assess signs and symptoms of infection every visit Notes: Venous Leg Ulcer Nursing Diagnoses: Actual venous Insuffiency (use after diagnosis is confirmed) Goals: Patient will maintain optimal edema control Date Initiated: 12/10/2018 Target Resolution Date: 01/09/2019 Goal Status: Active Interventions: Assess peripheral edema status every visit. Treatment Activities: Therapeutic compression applied : 12/10/2018 Notes: Wound/Skin Impairment Nursing Diagnoses: JAIEL, HOWER ([id number]) Impaired tissue integrity Goals: Patient/caregiver will verbalize understanding of skin care regimen Date Initiated: 12/10/2018 Target Resolution Date: 01/09/2019 Goal Status: Active Interventions: Assess ulceration(s) every visit Treatment Activities: Topical wound management initiated : 12/10/2018 Notes: Electronic Signature(s) Signed: 03/30/2020 5:27:22 PM By: [name], [name], RN, CWS, [name], [name] Entered By: [name], [name], RN, CWS, Kim on 03/30/2020 13:18:12 Shroff, [name] -------------------------------------------------------------------------------- Pain Assessment Details Patient Name: EDELWEISS, GUGLIELMI. Date of Service: 03/30/2020 12:45 PM Medical Record Number: [id number] Patient Account Number: [id number] Date of Birth/Sex: Sep 29, 1946 (74 y.o. F) Treating RN: Army Melia Primary Care Khrystyna Schwalm: [name] Other  Clinician: Referring Zaydrian Batta: [name] Treating Kamir Selover/Extender: Beverly Gust in Treatment: 68 Active Problems Location of Pain Severity and Description of Pain Patient Has Paino No Site Locations Pain Management and Medication Current Pain Management: Electronic Signature(s) Signed:  03/30/2020 3:54:29 PM By: Army Melia Entered By: Army Melia on 03/30/2020 12:56:57 Foiles, [name] ([id number]) -------------------------------------------------------------------------------- Patient/Caregiver Education Details Patient Name: AVA, FRADKIN. Date of Service: 03/30/2020 12:45 PM Medical Record Number: [id number] Patient Account Number: [id number] Date of Birth/Gender: June 22, 1946 (74 y.o. F) Treating RN: Cornell Barman Primary Care Physician: [name] Other Clinician: Referring Physician: [name] Treating Physician/Extender: Beverly Gust in Treatment: 62 Education Assessment Education Provided To: Patient Education Topics Provided Wound/Skin Impairment: Handouts: Caring for Your Ulcer Methods: Demonstration, Explain/Verbal Responses: State content correctly Electronic Signature(s) Signed: 03/30/2020 5:27:22 PM By: [name], [name], RN, CWS, [name], [name] Entered By: [name], [name], RN, CWS, Kim on 03/30/2020 13:25:03 [name] ([id number]) -------------------------------------------------------------------------------- Wound Assessment Details Patient Name: LURDES, STULTZ. Date of Service: 03/30/2020 12:45 PM Medical Record Number: [id number] Patient Account Number: [id number] Date of Birth/Sex: [birth year] (74 y.o. F) Treating RN: Army Melia Primary Care Luken Shadowens: [name] Other Clinician: Referring Shyan Scalisi: [name] Treating Prakriti Carignan/Extender: Beverly Gust in Treatment: 68 Wound Status Wound Number: 5 Primary Lymphedema Etiology: Wound Location: Left, Medial Lower Leg Wound Open Wounding Event: Gradually Appeared Status: Date Acquired: 11/19/2018 Comorbid Cataracts, Asthma, Sleep Apnea, Deep Vein Thrombosis, Weeks Of Treatment: 68 History: Hypertension, Peripheral Venous Disease, Osteoarthritis, Clustered Wound: No Received Chemotherapy, Received Radiation Photos Wound Measurements Length: (cm) 1.9 Width:  (cm) 1.5 Depth: (cm) 0.1 Area: (cm) 2.238 Volume: (cm) 0.224 % Reduction in Area: 73.8% % Reduction in Volume: 73.8% Epithelialization: None Tunneling: No Undermining: No Wound Description Classification: Full Thickness Without Exposed Support Structures Wound Margin: Flat and Intact Exudate Amount: Medium Exudate Type: Serous Exudate Color: amber Foul Odor After Cleansing: No Slough/Fibrino Yes Wound Bed Granulation Amount: Medium (34-66%) Exposed Structure Granulation Quality: Pink Fascia Exposed: No Necrotic Amount: Medium (34-66%) Fat Layer (Subcutaneous Tissue) Exposed: Yes Necrotic Quality: Eschar, Adherent Slough Tendon Exposed: No Muscle Exposed: No Joint Exposed: No Bone Exposed: No Treatment Notes Wound #5 (Left, Medial Lower Leg) Notes Left: alginate, ABD, 4 layer TCA, Right compression sock Electronic Signature(s) BRINKLEE, CRUSER ([id number]) Signed: 03/30/2020 3:54:29 PM By: Army Melia Entered By: Army Melia on 03/30/2020 12:58:00 Kearse, [name] ([id number]) -------------------------------------------------------------------------------- Wound Assessment Details Patient Name: LASHEL, JENTZEN. Date of Service: 03/30/2020 12:45 PM Medical Record Number: [id number] Patient Account Number: [id number] Date of Birth/Sex: 10/25/1946 (74 y.o. F) Treating RN: Army Melia Primary Care Lindie Roberson: [name] Other Clinician: Referring Freman Lapage: [name] Treating Correy Weidner/Extender: Beverly Gust in Treatment: 68 Wound Status Wound Number: 6 Primary Venous Leg Ulcer Etiology: Wound Location: Left, Lateral Lower Leg Wound Open Wounding Event: Gradually Appeared Status: Date Acquired: 01/19/2019 Comorbid Cataracts, Asthma, Sleep Apnea, Deep Vein Thrombosis, Weeks Of Treatment: 62 History: Hypertension, Peripheral Venous Disease, Osteoarthritis, Clustered Wound: No Received Chemotherapy, Received Radiation Photos Wound  Measurements Length: (cm) 2 Width: (cm) 2 Depth: (cm) 0.1 Area: (cm) 3.142 Volume: (cm) 0.314 % Reduction in Area: -1328.2% % Reduction in Volume: -1327.3% Epithelialization: Small (1-33%) Wound Description Classification: Full Thickness Without Exposed Support Structu Wound Margin: Flat and Intact Exudate Amount: Medium Exudate Type: Serous Exudate Color: amber res Foul Odor After Cleansing: No Slough/Fibrino Yes Wound Bed Granulation Amount: Medium (34-66%) Exposed Structure Granulation Quality: Pink Fascia Exposed: No Necrotic Amount: Medium (34-66%) Fat Layer (Subcutaneous Tissue)  Exposed: Yes Necrotic Quality: Eschar, Adherent Slough Tendon Exposed: No Muscle Exposed: No Joint Exposed: No Bone Exposed: No Treatment Notes Wound #6 (Left, Lateral Lower Leg) Notes Left: alginate, ABD, 4 layer TCA, Right compression sock Electronic Signature(s) MENDIE, MCBETH ([id number]) Signed: 03/30/2020 3:54:29 PM By: Army Melia Entered By: Army Melia on 03/30/2020 12:58:25 Homan, [name] ([id number]) -------------------------------------------------------------------------------- Vitals Details Patient Name: [name], CICHOSZ. Date of Service: 03/30/2020 12:45 PM Medical Record Number: [id number] Patient Account Number: [id number] Date of Birth/Sex: 09-[name]-1946 (74 y.o. F) Treating RN: Cornell Barman Primary Care Jourdyn Ferrin: [name] Other Clinician: Referring Jennye Runquist: [name] Treating Madicyn Mesina/Extender: Beverly Gust in Treatment: 68 Vital Signs Time Taken: 12:50 Temperature (F): 98.2 Height (in): 63 Pulse (bpm): 69 Weight (lbs): 224.7 Respiratory Rate (breaths/min): 18 Body Mass Index (BMI): 39.8 Blood Pressure (mmHg): 144/63 Reference Range: 80 - 120 mg / dl Electronic Signature(s) Signed: 03/31/2020 11:04:59 AM By: Lorine Bears RCP, RRT, CHT Entered By: Lorine Bears on 03/30/2020 12:52:22

## 2020-03-31 NOTE — Progress Notes (Signed)
[name], [name] Visit Report for 03/30/2020 HPI Details Patient Name: [name], [name]. Date of Service: 03/30/2020 12:45 PM Medical Record Number: [id number] Patient Account Number: [id number] Date of Birth/Sex: 06-16-1946 (74 y.o. F) Treating RN: Cornell Barman Primary Care Provider: [name] Other Clinician: Referring Provider: [name] Treating Provider/Extender: Beverly Gust in Treatment: 28 History of Present Illness HPI Description: Pleasant 74 year old with history of chronic venous insufficiency. No diabetes or peripheral vascular disease. Left ABI 1.29. Questionable history of left lower extremity DVT. She developed a recurrent ulceration on her left lateral calf in December 2015, which she attributes to poor diet and subsequent lower extremity edema. She underwent endovenous laser ablation of her left greater saphenous vein in 2010. She underwent laser ablation of accessory branch of left GSV in April 2016 by Dr. Kellie Simmering at [facility]. She was previously wearing Unna boots, which she tolerated well. Tolerating 2 layer compression and cadexomer iodine. She returns to clinic for follow-up and is without new complaints. She denies any significant pain at this time. She reports persistent pain with pressure. No claudication or ischemic rest pain. No fever or chills. No drainage. READMISSION 11/13/16; this is a 74 year old woman who is not a diabetic. She is here for a review of a painful area on her left medial lower extremity. I note that she was seen here previously last year for wound I believe to be in the same area. At that time she had undergone previously a left greater saphenous vein ablation by Dr. Kellie Simmering and she had a ablation of the anterior accessory branch of the left greater saphenous vein in March 2016. Seeing that the wound actually closed over. In reviewing the history with her today the ulcer in this area has been recurrent. She describes  a biopsy of this area in 2009 that only showed stasis physiology. 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 Do not see that she has ever had arterial studies in the left leg. She tells me she has a follow-up with Dr. Kellie Simmering in roughly 10 days In any case she developed the reopening of this area roughly a month ago. On the background of this she describes rapidly increasing edema which has responded to Lasix 40 mg and metolazone 2.5 mg as well as the patient's lymph massage. She has been told she has both venous insufficiency and lymphedema but she cannot tolerate compression stockings 11/28/16; the patient saw Dr. Kellie Simmering recently. Per the patient he did arterial Dopplers in the office that did not show evidence of arterial insufficiency, per the patient he stated "treat this like an ordinary venous ulcer". She also saw her dermatologist Dr. Ronnald Ramp who felt that this was more of a vascular ulcer. In general things are improving although she arrives today with increasing bilateral lower extremity edema with weeping a deeper fluid through the wound on the left medial leg compatible with some degree of lymphedema 12/04/16; the patient's wound is fully epithelialized but I don't think fully healed. We will do another week of depression with Promogran and TCA however I suspect we'll be able to discharge her next week. This is a very unusual-looking wound which was initially a figure-of-eight type wound lying on its side surrounded by petechial like hemorrhage. She has  had venous ablation on this side. She apparently does not have an arterial issue per Dr. Kellie Simmering. She saw her dermatologist thought it was "vascular". Patient is definitely going to need ongoing compression and I talked about  this with her today she will go to elastic therapy after she leaves here next week 12/11/16; the patient's wound is not completely closed today. She has surrounding scar tissue and in further discussion with the patient it would appear that she had ulcers in this area in 2009 for a prolonged period of time ultimately requiring a punch biopsy of this area that only showed venous insufficiency. I did not previously pickup on this part of the history from the patient. 12/18/16; the patient's wound is completely epithelialized. There is no open area here. She has significant bilateral venous insufficiency with secondary lymphedema to a mild-to-moderate degree she does not have compression stockings.. She did not say anything to me when I was in the room, she told our intake nurse that she was still having pain in this area. This isn't unusual recurrent small open area. She is going to go to elastic therapy to obtain compression stockings. 12/25/16; the patient's wound is fully epithelialized. There is no open area here. The patient describes some continued episodic discomfort in this area medial left calf. However everything looks fine and healed here. She is been to elastic therapy and caught herself 15-20 mmHg stockings, they apparently were having trouble getting 20-30 mm stockings in her size 01/22/17; this is a patient we discharged from the clinic a month ago. She has a recurrent open wound on her medial left calf. She had 15 mm support stockings. I told her I thought she needed 20-30 mm compression stockings. She tells me that she has been ill with hospitalization secondary to asthma and is been found to have severe hypokalemia likely secondary to a combination of Lasix and metolazone. This morning she noted blistering and leaking fluid on the posterior part of her left leg. She called our intake nurse urgently and we was saw her this afternoon. She has not had any real discomfort here. I don't know  that she's been wearing any stockings on this leg for at least 2-3 days. ABIs in this clinic were 1.21 on the right and 1.3 on the left. She is previously seen vascular surgery who does not think that there is a peripheral arterial issue. 01/30/17; Patient arrives with no open wound on the left leg. She has been to elastic therapy and obtained 20-78mmhg below knee stockings and she has one on the right leg today. READMISSION 02/19/18; this Magnussen is a now 74 year old patient we've had in this clinic perhaps 3 times before. I had last looked at her from January 07 December 2016 with an area on the medial left leg. We discharged her on 12/25/16 however she had to be readmitted on 01/22/17 with a recurrence. I have in my notes that we discharged her on 20-30 mm stockings although she tells me she was only wearing support hose because she cannot get stockings on predominantly related to her cervical spine surgery/issues. She has had previous ablations done by vein and vascular in [place] including a great saphenous vein ablation on the left with an anterior accessory branch ablation I think both of these were in 2016. On one of the previous visit she had a biopsy noted 2009 that was negative. She is not felt to have an arterial issue. She is not a diabetic. She does have  a history of obstructive sleep apnea hypertension asthma as well as chronic venous insufficiency and lymphedema. On this occasion she noted 2 dry scaly patch on her left leg. She tried to put lotion on this it didn't really help. There were 2 open areas.the [name], [name] ([id number]) patient has been seeing her primary physician from 02/05/18 through 02/14/18. She had Unna boots applied. The superior wound now on the lateral left leg has closed but she's had one wound that remains open on the lateral left leg. This is not the same spot as we dealt with in 2018. ABIs in this clinic were 1.3 bilaterally 02/26/18; patient has a small wound on  the left lateral calf. Dimensions are down. She has chronic venous insufficiency and lymphedema. 03/05/18; small open area on the left lateral calf. Dimensions are down. Tightly adherent necrotic debris over the surface of the wound which was difficult to remove. Also the dressing [over collagen] stuck to the wound surface. This was removed with some difficulty as well. Change the primary dressing to Hydrofera Blue ready 03/12/18; small open area on the left lateral calf. Comes in with tightly adherent surface eschar as well as some adherent Hydrofera Blue. 03/19/18; open area on the left lateral calf. Again adherent surface eschar as well as some adherent Hydrofera Blue nonviable subcutaneous tissue. She complained of pain all week even with the reduction from 4-3 layer compression I put on last week. Also she had an increase in her ankle and calf measurements probably related to the same thing. 03/26/18; open area on the left lateral calf. A very small open area remains here. We used silver alginate starting last week as the Hydrofera Blue seem to stick to the wound bed. In using 4-layer compression 04/02/18; the open area in the left lateral calf at some adherent slough which I removed there is no open area here. We are able to transition her into her own compression stocking. Truthfully I think this is probably his support hose. However this does not maintain skin integrity will be limited. She cannot put over the toe compression stockings on because of neck problems hand problems etc. She is allergic to the lining layer of juxta lites. We might be forced to use extremitease stocking should this fail READMIT 11/24/2018 Patient is now a 74 year old woman who is not a diabetic. She has been in this clinic on at least 3 previous occasions largely with recurrent wounds on her left leg secondary to chronic venous insufficiency with secondary lymphedema. Her situation is complicated by inability to  get stockings on and an allergy to neoprene which is apparently a component and at least juxta lites and other stockings. As a result she really has not been wearing any stockings on her legs. She tells [place] that roughly 2 or 3 weeks ago she started noticing a stinging sensation just above her ankle on the left medial aspect. She has been diagnosed with pseudogout and she wondered whether this was what she was experiencing. She tried to dress this with something she bought at the store however subsequently it pulled skin off and now she has an open wound that is not improving. She has been using Vaseline gauze with a cover bandage. She saw her primary doctor last week who put an Haematologist on her. ABIs in this clinic was 1.03 on the left 2/12; the area is on the left medial ankle. Odd-looking wound with what looks to be surface epithelialization but a multitude of small  petechial openings. This clearly not closed yet. We have been using silver alginate under 3 layer compression with TCA 2/19; the wound area did not look quite as good this week. Necrotic debris over the majority of the wound surface which required debridement. She continues to have a multitude of what looked to be small petechial openings. She reminds [place] that she had a biopsy on this initially during her first outbreak in 2015 in [place] [facility]. She expresses concern about this being a possible melanoma. 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 I assured her that this does not look anything like melanoma. Except for the petechial reaction it does look like a venous insufficiency area and she certainly has evidence of this on both sides 2/26; a difficult area on the left medial ankle. The patient clearly has chronic venous hypertension with some degree of lymphedema. The odd thing about the area is the small petechial hemorrhages. I am not  really sure how to explain this. This was present last time and this is not a compression injury. We have been using Hydrofera Blue which I changed to last week 3/4; still using Hydrofera Blue. Aggressive debridement today. She does not have known arterial issues. She has seen Dr. Kellie Simmering at [facility] vein and vascular and and has an ablation on the left. [Anterior accessory branch of the greater saphenous]. From what I remember they did not feel she had an arterial issue. The patient has had this area biopsied in 2009 at [facility] dermatology and by her recollection they said this was "stasis". She is also follow-up with dermatology locally who thought that this was more of a vascular issue 3/11; using Hydrofera Blue. Aggressive debridement today. She does not have an arterial issue. We are using 3 layer compression although we may need to go to 4. The patient has been in for multiple changes to her wrap since I last saw her a week ago. She says that the area was leaking. I do not have too much more information on what was found 01/19/19 on evaluation today patient was actually being seen for a nurse visit when unfortunately she had the area on her left lateral lower extremity as well as weeping from the right lower extremity that became apparent. Therefore we did end up actually seeing her for a full visit with myself. She is having some pain at this site as well but fortunately nothing too significant at this point. No fevers, chills, nausea, or vomiting noted at this time. 3/18-Patient is back to the clinic with the left leg venous leg ulcer, the ulcer is larger in size, has a surface that is densely adherent with fibrinous tissue, the Hydrofera Blue was used but is densely adherent and there was difficulty in removing it. The right lower extremity was also wrapped for weeping edema. Patient has a new area over the left lateral foot above the malleolus that is small and appears to have no debris  with intact surrounding skin. Patient is on increased dose of Lasix also as a means to edema management 3/25; the patient has a nonhealing venous ulcer on the medial left leg and last week developed a smaller area on the lateral left calf. We have been using Hydrofera Blue with a contact layer. 4/1; no major change in these wounds areas. Left medial and more recently left lateral calf. I tried Iodoflex last week to aid in debridement she  did not tolerate this. She stated her pain was terrible all week. She took the top layer of the 4 layer compression off. 4/8; the patient actually looks somewhat better in terms of her more prominent left lateral calf wound. There is some healthy looking tissue here. She is still complaining of a lot of discomfort. 4/15; patient in a lot of pain secondary to sciatica. She is on a prednisone taper prescribed by her primary physician. She has the 2 areas one on the left medial and more recently a smaller area on the left lateral calf. Both of these just above the malleoli 4/22; her back pain is better but she still states she is very uncomfortable and now feels she is intolerant to the [employer]. No real change in the wounds we have been using Sorbact. She has been previously intolerant to Iodoflex. There is not a lot of option about what we can use to debride this wound under compression that she no doubt needs. sHe states Ultram no longer works for her pain 4/29; no major change in the wounds slightly increased depth. Surface on the original medial wound perhaps somewhat improved however the more recent area on the lateral left ankle is 100% covered in very adherent debris we have been using Sorbact. She tolerates 4 layer compression well and her edema control is a lot better. She has not had to come in for a nurse check 5/6; no major change in the condition of the wounds. She did consent to debridement today which was done with some difficulty. Continuing Sorbact.  She did not tolerate Iodoflex. She was in for a check of her compression the day after we wrapped her last week this was adjusted but nothing much was found 5/13; no major change in the condition or area of the wounds. I was able to get a fairly aggressive debridement done on the lateral left leg wound. Even using Sorbact under compression. She came back in on Friday to have the wrap changed. She says she felt uncomfortable on the [name], [name]. ([id number]) lateral aspect of her ankle. She has a long history of chronic venous insufficiency including previous ablation surgery on this side. 5/20-Patient returns for wounds on left leg with both wounds covered in slough, with the lateral leg wound larger in size, she has been in 3 layer compression and felt more comfortable, she describes pain in ankle, in leg and pins and needles in foot, and is about to try Pamelor for this 6/3; wounds on the left lateral and left medial leg. The area medially which is the most recent of the 2 seems to have had the largest increase in dimensions. We have been using Sorbac to try and debride the surface. She has been to see orthopedics they apparently did a plain x-ray that was indeterminant. Diagnosed her with neuropathy and they have ordered an MRI to determine if there is underlying osteomyelitis. This was not high on my thought list but I suppose it is prudent. We have advised her to make an appointment with vein and vascular in [place]. She has a history of a left greater saphenous and accessory vein ablations I wonder if there is anything else that can be done from a surgical point of view to help in these difficult refractory wounds. We have previously healed this wound on one occasion but it keeps on reopening [medial side] 6/10; deep tissue culture I did last week I think on the left medial wound showed both  moderate E. coli and moderate staph aureus [MSSA]. She is going to require antibiotics and I have  chosen Augmentin. We have been using Sorbact and we have made better looking wound surface on both sides but certainly no improvement in wound area. She was back in last Friday apparently for a dressing changes the wrap was hurting her outer left ankle. She has not managed to get a hold of vein and vascular in [place]. We are going to have to make her that appointment 6/17; patient is tolerating the Augmentin. She had an MRI that I think was ordered by orthopedic surgeon this did not show osteomyelitis or an abscess did suggest cellulitis. We have been using Sorbact to the lateral and medial ankles. We have been trying to arrange a follow-up appointment with vein and vascular in [place] or did her original ablations. We apparently an area sent the request to vein and vascular in [facility] 6/24; patient has completed the Augmentin. We do not yet have a vein and vascular appointment in [place]. I am not sure what the issue is here we have asked her to call tomorrow. We are using Sorbact. Making some improvements and especially the medial wound. Both surfaces however look better medial and lateral. 7/1; the patient has been in contact with vein and vascular in [place] but has not yet received an appointment. Using Sorbact we have gradually improve the wound surface with no improvement in surface area. She is approved for Apligraf but the wound surface still is not completely viable. She has not had to come in for a dressing change 7/8; the patient has an appointment with vein and vascular on 7/31 which is a Friday afternoon. She is concerned about getting back here for [place] to dress her wounds. I think it is important to have them goal for her venous reflux/history of ablations etc. to see if anything else can be done. She apparently tested positive for 1 of the blood tests with regards to lupus and saw a rheumatologist. He has raised the issue of vasculitis again. I have had this thought in  the past however the evidence seems overwhelming that this is a venous reflux etiology. If the rheumatologist tells me there is clinical and laboratory investigation is positive for lupus I will rethink this. 7/15; the patient's wound surfaces are quite a bit better. The medial area which was her original wound now has no depth although the lateral wound which was the more recent area actually appears larger. Both with viable surfaces which is indeed better. Using Sorbact. I wanted to use Apligraf on her however there is the issue of the vein and vascular appointment on 7/31 at 2:00 in the afternoon which would not allow her to get back to be rewrapped and they would no doubt remove the graft 7/22; the patient's wound surfaces have moderate amount of debris although generally look better. The lateral one is larger with 2 small satellite areas superiorly. We are waiting for her vein and vascular appointment on 7/31. She has been approved for Apligraf which I would like to use after th 7/29; wound surfaces have improved no debridement is required we have been using Sorbact. She sees vein and vascular on Friday with this so question of whether anything can be done to lessen the likelihood of recurrence and/or speed the healing of these areas. She is already had previous ablations. She no doubt has severe venous hypertension 8/5-Patient returns at 1 week, she was in Haematologist for  3 days by her podiatrist, we have been using so backed to the wound, she has increased pain in both the wounds on the left lower leg especially the more distal one on the lateral aspect 8/12-Patient returns at 1 week and she is agreeable to having debridement in both wounds on her left leg today. We have been using Sorbact, and vascular studies were reviewed at last visit 8/19; the patient arrives with her wounds fairly clean and no debridement is required. We have used Sorbact which is really done a nice job in cleaning up  these very difficult wound surfaces. The patient saw Dr. Donzetta Matters of vascular surgery on 7/31. He did not feel that there was an arterial component. He felt that her treated greater saphenous vein is adequately addressed and that the small saphenous vein did not appear to be involved significantly. She was also noted to have deep venous reflux which is not treatable. Dr. Donzetta Matters mentioned the possibility of a central obstructive component leading to reflux and he offered her central venography. She wanted to discuss this or think about it. I have urged her to go ahead with this. She has had recurrent difficult wounds in these areas which do heal but after months in the clinic. If there is anything that can be done to reduce the likelihood of this I think it is worth it. 9/2 she is still working towards getting follow-up with Dr. Donzetta Matters to schedule her CT. Things are quite a bit worse venography. I put Apligraf on 2 weeks ago on both wounds on the medial and lateral part of her left lower leg. She arrives in clinic today with 3 superficial additional wounds above the area laterally and one below the wound medially. She describes a lot of discomfort. I think these are probably wrapped injuries. Does not look like she has cellulitis. 07/20/2019 on evaluation today patient appears to be doing somewhat poorly in regard to her lower extremity ulcers. She in fact showed signs of erythema in fact we may even be dealing with an infection at this time. Unfortunately I am unsure if this is just infection or if indeed there may be some allergic reaction that occurred as a result of the Apligraf application. With that being said that would be unusual but nonetheless not impossible in this patient is one who is unfortunately allergic to quite a bit. Currently we have been using the Sorbact which seems to do as well as anything for her. I do think we may want to obtain a culture today to see if there is anything showing up  there that may need to be addressed. 9/16; noted that last week the wounds look worse in 1 week follow-up of the Apligraf. Using Sorbact as of 2 days ago. She arrives with copious amounts of drainage and new skin breakdown on the back of the left calf. The wounds arm more substantial bilaterally. There is a fair amount of swelling in the left calf no overt DVT there is edema present I think in the left greater than right thigh. She is supposed to go on 9/28 for CT venography. The wounds on the medial and lateral calf are worse and she has new skin breakdown posteriorly at least new for me. This is almost developing into a circumferential wound area The Apligraf was taken off last week which I agree with things are not going in the right direction a culture was done we do not have that back yet. She is on  Augmentin that she started 2 days ago 9/23; dressing was changed by her nurses on Monday. In general there is no improvement in the wound areas although the area looks less angry than last week. She did get Augmentin for MSSA cultured on the 14th. She still appears to have too much swelling in the left leg even with 3 layer compression 9/30; the patient underwent her procedure on 9/28 by Dr. Donzetta Matters at [facility]. She was discovered to have the common iliac vein measuring 12.2 mm but at the level of L4-L5 measured 3 mm. After stenting it measured 10 mm. 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 Rouleaux flow in the common femoral and femoral vein was observed much improved after stenting. We are using silver alginate to the wounds on the medial and lateral ankle on the left. 4 layer compression 10/7; the patient had fluid swelling around her knee and 4 layer compression. At the advice of vein and vascular this was reduced to 3 layer which she is tolerating better. We have been using silver alginate under 3 layer compression since last Friday 10/14; arrives with the  areas on the left ankle looking a lot better. Inflammation in the area also a lot better. She came in for a nurse check on [name], [name] ([id number]) 10/9 10/21; continued nice improvement. Slight improvements in surface area of both the medial and lateral wounds on the left. A lot of the satellite lesions in the weeping erythema around these from stasis dermatitis is resolved. We have been using silver alginate 10/28; general improvement in the entire wound areas although not a lot of change in dimensions the wound certainly looks better. There is a lot less in terms of venous inflammation. Continue silver alginate this week however look towards Hydrofera Blue next week 11/4; very adherent debris on the medial wound left wound is not as bad. We have been using silver alginate. Change to [facility] today 11/11; very adherent debris on both wound areas. She went to vein and vascular last week and follow-up they put in [place] boot on this today. He says the [facility] was adherent. Wound is definitely not as good as last week. Especially on the left there the satellite lesions look more prominent 11/18; absolutely no better. erythema on lateral aspect with tenderness. 09/30/2019 on evaluation today patient appears to actually be doing better. Dr. Dellia Nims did put her on doxycycline last week which I do believe has helped her at this point. Fortunately there is no signs of active infection at this time. No fevers, chills, nausea, vomiting, or diarrhea. I do believe he may want extend the doxycycline for 7 additional days just to ensure everything does completely cleared up the patient is in agreement with that plan. Otherwise she is going require some sharp debridement today 12/2; patient is completing a 2-week course of doxycycline. I gave her this empirically for inflammation as well as infection when I last saw her 2 weeks ago. All of this seems to be better. She is using silver alginate she  has the area on the medial aspect of the larger area laterally and the 2 small satellite regions laterally above the major wound. 12/9; the patient's wound on the left medial and left lateral calf look really quite good. We have been using silver alginate. She saw vein and vascular in follow-up on 10/09/2019. She has had a previous left greater saphenous vein ablation by Dr. Oscar La in 2016. More  recently she underwent a left common iliac vein stent by Dr. 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 The swelling is improved and certainly the wounds have improved. The patient shows [place] today area on the right medial calf there is almost no wound but leaking lymphedema. She says she start this started 3 or 4 days ago. She did not traumatize it. It is not painful. She does not wear compression on that side 12/16; the patient continues to do well laterally. Medially still requiring debridement. The area on the right calf did not materialize to anything and is not currently open. We wrapped this last time. She has support stockings for that leg although I am not sure they are going to provide adequate compression 12/23; the lateral wound looks stable. Medially still requiring debridement for tightly adherent fibrinous debris. We've been using silver alginate. Surface area not any different 12/30; neither wound is any better with regards to surface and the area on the left lateral is larger. I been using silver alginate to the left lateral which look quite good last week and Sorbact to the left medial 11/11/2019. Lateral wound area actually looks better and somewhat smaller. Medial still requires a very aggressive debridement today. We have been using Sorbact on both wound areas 1/13; not much better still adherent debris bilaterally. I been using Sorbact. She has severe venous hypertension. Probably some degree of dermal fibrosis distally. I wonder whether tighter compression might help and I am going  to try that today. We also need to work on the bioburden 1/20; using Sorbact. She has severe venous hypertension status post stent placement for pelvic vein compression. We applied gentamicin last time to see if we could reduce bioburden I had some discussion with her today about the use of pentoxifylline. This is occasionally used in this setting for wounds with refractory venous insufficiency. However this interacts with Plavix. She tells me that she was put on this after stent placement for 3 months. She will call Dr. Claretha Cooper office to discuss 1/27; we are using gentamicin under Sorbact. 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 She has a stent. Wound medially is measuring smaller this week. Laterally measuring slightly larger although she has some satellite lesions superiorly 2/3; gentamicin under Sorbact under 4-layer compression. 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 She has a stent on Plavix. Her wounds are measuring smaller this week. More substantially laterally where there is a satellite lesion superiorly. 2/10; gentamicin under Sorbac. 4-layer compression. Patient communicated with Dr. Donzetta Matters at vein and vascular in [place]. He is okay with the patient coming off Plavix I will therefore start her on pentoxifylline for a 1 month trial. In general her wounds look better today. I had some concerns about swelling in the left thigh however she measures 61.5 on the right and 63 on the mid thigh which does not suggest there is any difficulty. The patient is not describing any pain. 2/17; gentamicin under Sorbac 4-layer compression. She has been on pentoxifylline for 1 week and complains of loose stool. No nausea she is eating and drinking well 2/24; the patient apparently came in 2 days ago for a nurse visit when her wrap fell down. Both areas look a little worse this week macerated medially and satellite lesions laterally. Change to  silver alginate today 3/3; wounds are larger today especially medially. She also has more swelling in her foot lower leg and I even noted some swelling in her  posterior thigh which is tender. I wonder about the patency of her stent. Fortuitously she sees Dr. Claretha Cooper group on Friday 3/10; Mrs. Delange was seen by vein and vascular on 3/5. The patient underwent ultrasound. There was no evidence of thrombosis involving the IVC no evidence of thrombosis involving the right common iliac vein there is no evidence of thrombosis involving the right external iliac vein the left external vein is also patent. The right common iliac vein stent appears patent bilateral common femoral veins are compressible and appear patent. I was concerned about the left common iliac stent however it looks like this is functional. She has some edema in the posterior thigh that was tender she still has that this week. I also note they had trouble finding the pulses in her left foot and booked her for an ABI baseline in 4 weeks. She will follow up in 6 months for repeat IVC duplex. The patient stopped the pentoxifylline because of diarrhea. It does not look like that was being effective in any case. I have advised her to go back on her aspirin 81 mg tablet, vascular it also suggested this 3/17; comes in today with her wound surfaces a lot better. The excoriations from last week considerably better probably secondary to the TCA. We have been using silver alginate 3/24; comes in today with smaller wounds both medially and laterally. Both required debridement. There are 2 small satellite areas superiorly laterally. She also has a very odd bandlike area in the mid calf almost looking like there was a weakness in the wrap in a localized area. I would write this off as being this however anteriorly she has a small raised ballotable area that is very tender almost reminiscent of an abscess but there was no obvious purulent surface to  it. 02/04/20 upon evaluation today patient appears to be doing fairly well in regard to her wounds today. Fortunately there is no signs of active [name], [name]. ([id number]) infection at this time. No fevers, chills, nausea, vomiting, or diarrhea. She has been tolerating the dressing changes without complication. Fortunately I feel like she is showing signs of improvement although has been sometime since have seen her. Nonetheless the area of concern that Dr. Dellia Nims had last week where she had possibly an area of the wrap that was we can allow the leg to bulge appears to be doing significantly better today there is no signs of anything worsening. 4/7; the patient's wounds on her medial and lateral left leg continue to contract. We have been using a regular alginate. Last week she developed an area on the right medial lower leg which is probably a venous ulcer as well. 4/14; the wounds on her left medial and lateral lower leg continue to contract. Surface eschar. We have been using regular alginate. The area on the right medial lower leg is closed. We have been putting both legs under 4-layer contraction. The patient went back to see vein and vascular she had arterial studies done which were apparently "quite good" per the patient although I have not read their notes I have never felt she had an arterial issue. 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 This is been longstanding and refractory to exercise, leg elevation and longstanding use of compression wraps in our clinic as well as compression stockings on the times we have been able to get these to heal 4/21; we thought she actually might be close this week however she arrives in clinic  with a lot of edema in her upper left calf and into her posterior thigh. This is been an intermittent problem here. She says the wrap fell down but it was replaced with a nurse visit on Monday. We are using calcium  alginate to the wounds and the wound sizes there not terribly larger than last week but there is a lot more edema 4/28; again wound edges are smaller on both sides. Her edema is better controlled than last time. She is obtained her compression pumps from [employer] although they have not been to her home to set these up. 5/5; left medial and left lateral both look stable. I am not sure the medial is any smaller. We have been using calcium alginate under 4-layer compression. oShe had an area on the right medial. This was eschared today. We have been wrapping this as well. She does not tolerate external compression stockings due to a history of various contact allergies. She has her compression pumps however the representative from the company is coming on her to show her how to use these tomorrow 5/19; patient with severe chronic venous insufficiency secondary to central venous disease. She had a stent placed in her left common iliac vein. She has done better since but still difficult to control wounds. She comes in today with nothing open on the right leg. Her areas on the left medial and left lateral are just about closed. We are using calcium alginate under 4-layer compression. She is using her external compression pumps at home She only has 15-20 support stockings. States she cannot get anything tighter than that on. 03/30/20-Patient returns at 1 week, the wounds on the left leg are both slightly bigger, the last week she was on 3 layer compression which started to slide down. She is starting to use her lymphedema pumps although she stated on 1 day her right ankle started to swell up and she have to stop that day. Unfortunately the open area seem to oscillate between improving to the point of healing and then flaring up all to do with effectiveness of compression or lack of due to the left leg topography not keeping the compression wraps from rolling down Electronic Signature(s) Signed:  03/30/2020 1:26:05 PM By: Tobi Bastos MD, MBA Entered By: Tobi Bastos on 03/30/2020 13:26:04 [name] -------------------------------------------------------------------------------- Physical Exam Details Patient Name: KALYCE, BALEK. Date of Service: 03/30/2020 12:45 PM Medical Record Number: [id number] Patient Account Number: [id number] Date of Birth/Sex: 10-25-1946 (74 y.o. F) Treating RN: Cornell Barman Primary Care Provider: [name] Other Clinician: Referring Provider: [name] Treating Provider/Extender: Beverly Gust in Treatment: 68 Constitutional alert and oriented x 3. sitting or standing blood pressure is within target range for patient.. supine blood pressure is within target range for patient.. pulse regular and within target range for patient.Marland Kitchen respirations regular, non-labored and within target range for patient.Marland Kitchen temperature within target range for patient.. . . [name] and well-hydrated in no acute distress. Notes Left medial and left lateral wounds clean base, surrounding skin intact, left leg does have fair amount of edema that is champagne glass shaped Electronic Signature(s) Signed: 03/30/2020 1:26:39 PM By: Tobi Bastos MD, MBA Entered By: Tobi Bastos on 03/30/2020 13:26:38 [name], [name] -------------------------------------------------------------------------------- Physician Orders Details Patient Name: KRYSTALL, HONER. Date of Service: 03/30/2020 12:45 PM Medical Record Number: [id number] Patient Account Number: [id number] Date of Birth/Sex: Dec 26, 1945 (74 y.o. F) Treating RN: Cornell Barman Primary Care Provider: [name] Other Clinician: Referring Provider:  [name] Treating Provider/Extender: Beverly Gust in Treatment: 6 Verbal / Phone Orders: No Diagnosis Coding Anesthetic (add to Medication List) Wound #5 Left,Medial Lower Leg o Topical Lidocaine 4%  cream applied to wound bed prior to debridement (In Clinic Only). Wound #6 Left,Lateral Lower Leg o Topical Lidocaine 4% cream applied to wound bed prior to debridement (In Clinic Only). Skin Barriers/Peri-Wound Care Wound #5 Left,Medial Lower Leg o Triamcinolone Acetonide Ointment (TCA) - peri-wound Wound #6 Left,Lateral Lower Leg o Triamcinolone Acetonide Ointment (TCA) - peri-wound Primary Wound Dressing Wound #5 Left,Medial Lower Leg o Alginate Wound #6 Left,Lateral Lower Leg o Alginate Secondary Dressing Wound #5 Left,Medial Lower Leg o ABD pad Wound #6 Left,Lateral Lower Leg o ABD pad Dressing Change Frequency Wound #5 Left,Medial Lower Leg o Change dressing every week o Other: - Nurse visit Friday and Monday if needed. Wound #6 Left,Lateral Lower Leg o Change dressing every week o Other: - Nurse visit Friday and Monday if needed. Follow-up Appointments Wound #5 Left,Medial Lower Leg o Return Appointment in 1 week. o Nurse Visit as needed Wound #6 Left,Lateral Lower Leg o Return Appointment in 1 week. o Nurse Visit as needed Edema Control Wound #5 Left,Medial Lower Leg o 3 Layer Compression System - Left Lower Extremity o Patient to wear own compression stockings - On Right o Compression Pump: Use compression pump on left lower extremity for 60 minutes, twice daily. o Compression Pump: Use compression pump on right lower extremity for 60 minutes, twice daily. Wound #6 Left,Lateral Lower Leg o 3 Layer Compression System - Left Lower Extremity [name], [name] o Patient to wear own compression stockings - On Right o Compression Pump: Use compression pump on left lower extremity for 60 minutes, twice daily. o Compression Pump: Use compression pump on right lower extremity for 60 minutes, twice daily. Electronic Signature(s) Signed: 03/30/2020 3:57:01 PM By: Tobi Bastos MD, MBA Signed: 03/30/2020 5:27:22 PM  By: [name] [name], RN, CWS, [name], [name] Entered By: [name], [name], RN, CWS, Kim on 03/30/2020 13:24:16 [name], [name] -------------------------------------------------------------------------------- Progress Note Details Patient Name: [name], [name]. Date of Service: 03/30/2020 12:45 PM Medical Record Number: [id number] Patient Account Number: [id number] Date of Birth/Sex: 1946-07-10 (74 y.o. F) Treating RN: Cornell Barman Primary Care Provider: [name] Other Clinician: Referring Provider: [name] Treating Provider/Extender: Beverly Gust in Treatment: 68 Subjective History of Present Illness (HPI) Pleasant 74 year old with history of chronic venous insufficiency. No diabetes or peripheral vascular disease. Left ABI 1.29. Questionable history of left lower extremity DVT. She developed a recurrent ulceration on her left lateral calf in December 2015, which she attributes to poor diet and subsequent lower extremity edema. She underwent endovenous laser ablation of her left greater saphenous vein in 2010. She underwent laser ablation of accessory branch of left GSV in April 2016 by Dr. Kellie Simmering at [facility]. She was previously wearing Unna boots, which she tolerated well. Tolerating 2 layer compression and cadexomer iodine. She returns to clinic for follow-up and is without new complaints. She denies any significant pain at this time. She reports persistent pain with pressure. No claudication or ischemic rest pain. No fever or chills. No drainage. READMISSION 11/13/16; this is a 74 year old woman who is not a diabetic. She is here for a review of a painful area on her left medial lower extremity. I note that she was seen here previously last year for wound I believe to be in the same area. At that time she had  undergone previously a left greater saphenous vein ablation by Dr. Kellie Simmering and she had a ablation of the anterior accessory branch of the left greater saphenous  vein in March 2016. Seeing that the wound actually closed over. In reviewing the history with her today the ulcer in this area has been recurrent. She describes a biopsy of this area in 2009 that only showed stasis physiology. 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 Do not see that she has ever had arterial studies in the left leg. She tells me she has a follow-up with Dr. Kellie Simmering in roughly 10 days In any case she developed the reopening of this area roughly a month ago. On the background of this she describes rapidly increasing edema which has responded to Lasix 40 mg and metolazone 2.5 mg as well as the patient's lymph massage. She has been told she has both venous insufficiency and lymphedema but she cannot tolerate compression stockings 11/28/16; the patient saw Dr. Kellie Simmering recently. Per the patient he did arterial Dopplers in the office that did not show evidence of arterial insufficiency, per the patient he stated "treat this like an ordinary venous ulcer". She also saw her dermatologist Dr. Ronnald Ramp who felt that this was more of a vascular ulcer. In general things are improving although she arrives today with increasing bilateral lower extremity edema with weeping a deeper fluid through the wound on the left medial leg compatible with some degree of lymphedema 12/04/16; the patient's wound is fully epithelialized but I don't think fully healed. We will do another week of depression with Promogran and TCA however I suspect we'll be able to discharge her next week. This is a very unusual-looking wound which was initially a figure-of-eight type wound lying on its side surrounded by petechial like hemorrhage. She has had venous ablation on this side. She apparently does not have an  arterial issue per Dr. Kellie Simmering. She saw her dermatologist thought it was "vascular". Patient is definitely going to need ongoing compression and I talked about this with her today she will go to elastic therapy after she leaves here next week 12/11/16; the patient's wound is not completely closed today. She has surrounding scar tissue and in further discussion with the patient it would appear that she had ulcers in this area in 2009 for a prolonged period of time ultimately requiring a punch biopsy of this area that only showed venous insufficiency. I did not previously pickup on this part of the history from the patient. 12/18/16; the patient's wound is completely epithelialized. There is no open area here. She has significant bilateral venous insufficiency with secondary lymphedema to a mild-to-moderate degree she does not have compression stockings.. She did not say anything to me when I was in the room, she told our intake nurse that she was still having pain in this area. This isn't unusual recurrent small open area. She is going to go to elastic therapy to obtain compression stockings. 12/25/16; the patient's wound is fully epithelialized. There is no open area here. The patient describes some continued episodic discomfort in this area medial left calf. However everything looks fine and healed here. She is been to elastic therapy and caught herself 15-20 mmHg stockings, they apparently were having trouble getting 20-30 mm stockings  in her size 01/22/17; this is a patient we discharged from the clinic a month ago. She has a recurrent open wound on her medial left calf. She had 15 mm support stockings. I told her I thought she needed 20-30 mm compression stockings. She tells me that she has been ill with hospitalization secondary to asthma and is been found to have severe hypokalemia likely secondary to a combination of Lasix and metolazone. This morning she noted blistering and leaking fluid on the  posterior part of her left leg. She called our intake nurse urgently and we was saw her this afternoon. She has not had any real discomfort here. I don't know that she's been wearing any stockings on this leg for at least 2-3 days. ABIs in this clinic were 1.21 on the right and 1.3 on the left. She is previously seen vascular surgery who does not think that there is a peripheral arterial issue. 01/30/17; Patient arrives with no open wound on the left leg. She has been to elastic therapy and obtained 20-7mmhg below knee stockings and she has one on the right leg today. READMISSION 02/19/18; this Purdy is a now 74 year old patient we've had in this clinic perhaps 3 times before. I had last looked at her from January 07 December 2016 with an area on the medial left leg. We discharged her on 12/25/16 however she had to be readmitted on 01/22/17 with a recurrence. I have in my notes that we discharged her on 20-30 mm stockings although she tells me she was only wearing support hose because she cannot get stockings on predominantly related to her cervical spine surgery/issues. She has had previous ablations done by vein and vascular in [place] including a great saphenous vein ablation on the left with an anterior accessory branch ablation I think both of these were in 2016. On one of the previous visit she had a biopsy noted 2009 that was negative. She is not felt to have an arterial issue. She is not a diabetic. She does have a history of obstructive sleep apnea hypertension asthma as well as chronic venous insufficiency and lymphedema. On this occasion she noted 2 dry scaly patch on her left leg. She tried to put lotion on this it didn't really help. There were 2 open areas.the patient has been seeing her primary physician from 02/05/18 through 02/14/18. She had Unna boots applied. The superior wound now on the lateral left leg has closed but she's had one wound that remains open on the lateral left leg.  This is not the same spot as we dealt with in 2018. ABIs in this clinic were 1.3 bilaterally CORDELLIA, SHAUGHNESSY ([id number]) 02/26/18; patient has a small wound on the left lateral calf. Dimensions are down. She has chronic venous insufficiency and lymphedema. 03/05/18; small open area on the left lateral calf. Dimensions are down. Tightly adherent necrotic debris over the surface of the wound which was difficult to remove. Also the dressing [over collagen] stuck to the wound surface. This was removed with some difficulty as well. Change the primary dressing to Hydrofera Blue ready 03/12/18; small open area on the left lateral calf. Comes in with tightly adherent surface eschar as well as some adherent Hydrofera Blue. 03/19/18; open area on the left lateral calf. Again adherent surface eschar as well as some adherent Hydrofera Blue nonviable subcutaneous tissue. She complained of pain all week even with the reduction from 4-3 layer compression I put on last week. Also she had an increase  in her ankle and calf measurements probably related to the same thing. 03/26/18; open area on the left lateral calf. A very small open area remains here. We used silver alginate starting last week as the Hydrofera Blue seem to stick to the wound bed. In using 4-layer compression 04/02/18; the open area in the left lateral calf at some adherent slough which I removed there is no open area here. We are able to transition her into her own compression stocking. Truthfully I think this is probably his support hose. However this does not maintain skin integrity will be limited. She cannot put over the toe compression stockings on because of neck problems hand problems etc. She is allergic to the lining layer of juxta lites. We might be forced to use extremitease stocking should this fail READMIT 11/24/2018 Patient is now a 74 year old woman who is not a diabetic. She has been in this clinic on at least 3 previous occasions largely  with recurrent wounds on her left leg secondary to chronic venous insufficiency with secondary lymphedema. Her situation is complicated by inability to get stockings on and an allergy to neoprene which is apparently a component and at least juxta lites and other stockings. As a result she really has not been wearing any stockings on her legs. She tells [place] that roughly 2 or 3 weeks ago she started noticing a stinging sensation just above her ankle on the left medial aspect. She has been diagnosed with pseudogout and she wondered whether this was what she was experiencing. She tried to dress this with something she bought at the store however subsequently it pulled skin off and now she has an open wound that is not improving. She has been using Vaseline gauze with a cover bandage. She saw her primary doctor last week who put an Haematologist on her. ABIs in this clinic was 1.03 on the left 2/12; the area is on the left medial ankle. Odd-looking wound with what looks to be surface epithelialization but a multitude of small petechial openings. This clearly not closed yet. We have been using silver alginate under 3 layer compression with TCA 2/19; the wound area did not look quite as good this week. Necrotic debris over the majority of the wound surface which required debridement. She continues to have a multitude of what looked to be small petechial openings. She reminds [place] that she had a biopsy on this initially during her first outbreak in 2015 in [place] [facility]. She expresses concern about this being a possible melanoma. 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 I assured her that this does not look anything like melanoma. Except for the petechial reaction it does look like a venous insufficiency area and she certainly has evidence of this on both sides 2/26; a difficult area on the left medial ankle. The  patient clearly has chronic venous hypertension with some degree of lymphedema. The odd thing about the area is the small petechial hemorrhages. I am not really sure how to explain this. This was present last time and this is not a compression injury. We have been using Hydrofera Blue which I changed to last week 3/4; still using Hydrofera Blue. Aggressive debridement today. She does not have known arterial issues. She has seen Dr. Kellie Simmering at [facility] vein [facility] vascular and and has an ablation on the left. [Anterior accessory branch of the greater saphenous]. From what I remember they  did not feel she had an arterial issue. The patient has had this area biopsied in 2009 at [facility] dermatology and by her recollection they said this was "stasis". She is also follow-up with dermatology locally who thought that this was more of a vascular issue 3/11; using Hydrofera Blue. Aggressive debridement today. She does not have an arterial issue. We are using 3 layer compression although we may need to go to 4. The patient has been in for multiple changes to her wrap since I last saw her a week ago. She says that the area was leaking. I do not have too much more information on what was found 01/19/19 on evaluation today patient was actually being seen for a nurse visit when unfortunately she had the area on her left lateral lower extremity as well as weeping from the right lower extremity that became apparent. Therefore we did end up actually seeing her for a full visit with myself. She is having some pain at this site as well but fortunately nothing too significant at this point. No fevers, chills, nausea, or vomiting noted at this time. 3/18-Patient is back to the clinic with the left leg venous leg ulcer, the ulcer is larger in size, has a surface that is densely adherent with fibrinous tissue, the Hydrofera Blue was used but is densely adherent and there was difficulty in removing it. The right lower extremity  was also wrapped for weeping edema. Patient has a new area over the left lateral foot above the malleolus that is small and appears to have no debris with intact surrounding skin. Patient is on increased dose of Lasix also as a means to edema management 3/25; the patient has a nonhealing venous ulcer on the medial left leg and last week developed a smaller area on the lateral left calf. We have been using Hydrofera Blue with a contact layer. 4/1; no major change in these wounds areas. Left medial and more recently left lateral calf. I tried Iodoflex last week to aid in debridement she did not tolerate this. She stated her pain was terrible all week. She took the top layer of the 4 layer compression off. 4/8; the patient actually looks somewhat better in terms of her more prominent left lateral calf wound. There is some healthy looking tissue here. She is still complaining of a lot of discomfort. 4/15; patient in a lot of pain secondary to sciatica. She is on a prednisone taper prescribed by her primary physician. She has the 2 areas one on the left medial and more recently a smaller area on the left lateral calf. Both of these just above the malleoli 4/22; her back pain is better but she still states she is very uncomfortable and now feels she is intolerant to the [employer]. No real change in the wounds we have been using Sorbact. She has been previously intolerant to Iodoflex. There is not a lot of option about what we can use to debride this wound under compression that she no doubt needs. sHe states Ultram no longer works for her pain 4/29; no major change in the wounds slightly increased depth. Surface on the original medial wound perhaps somewhat improved however the more recent area on the lateral left ankle is 100% covered in very adherent debris we have been using Sorbact. She tolerates 4 layer compression well and her edema control is a lot better. She has not had to come in for a nurse  check 5/6; no major change in the  condition of the wounds. She did consent to debridement today which was done with some difficulty. Continuing Sorbact. She did not tolerate Iodoflex. She was in for a check of her compression the day after we wrapped her last week this was adjusted but nothing much was found 5/13; no major change in the condition or area of the wounds. I was able to get a fairly aggressive debridement done on the lateral left leg wound. Even using Sorbact under compression. She came back in on Friday to have the wrap changed. She says she felt uncomfortable on the lateral aspect of her ankle. She has a long history of chronic venous insufficiency including previous ablation surgery on this side. 5/20-Patient returns for wounds on left leg with both wounds covered in slough, with the lateral leg wound larger in size, she has been in 3 layer compression and felt more comfortable, she describes pain in ankle, in leg and pins and needles in foot, and is about to try Pamelor for this 6/3; wounds on the left lateral and left medial leg. The area medially which is the most recent of the 2 seems to have had the largest increase in [place], ASHLING GILLAM. ([id number]) dimensions. We have been using Sorbac to try and debride the surface. She has been to see orthopedics they apparently did a plain x-ray that was indeterminant. Diagnosed her with neuropathy and they have ordered an MRI to determine if there is underlying osteomyelitis. This was not high on my thought list but I suppose it is prudent. We have advised her to make an appointment with vein and vascular in [place]. She has a history of a left greater saphenous and accessory vein ablations I wonder if there is anything else that can be done from a surgical point of view to help in these difficult refractory wounds. We have previously healed this wound on one occasion but it keeps on reopening [medial side] 6/10; deep tissue culture I did  last week I think on the left medial wound showed both moderate E. coli and moderate staph aureus [MSSA]. She is going to require antibiotics and I have chosen Augmentin. We have been using Sorbact and we have made better looking wound surface on both sides but certainly no improvement in wound area. She was back in last Friday apparently for a dressing changes the wrap was hurting her outer left ankle. She has not managed to get a hold of vein and vascular in [place]. We are going to have to make her that appointment 6/17; patient is tolerating the Augmentin. She had an MRI that I think was ordered by orthopedic surgeon this did not show osteomyelitis or an abscess did suggest cellulitis. We have been using Sorbact to the lateral and medial ankles. We have been trying to arrange a follow-up appointment with vein and vascular in [place] or did her original ablations. We apparently an area sent the request to vein and vascular in [facility] 6/24; patient has completed the Augmentin. We do not yet have a vein and vascular appointment in [place]. I am not sure what the issue is here we have asked her to call tomorrow. We are using Sorbact. Making some improvements and especially the medial wound. Both surfaces however look better medial and lateral. 7/1; the patient has been in contact with vein and vascular in [place] but has not yet received an appointment. Using Sorbact we have gradually improve the wound surface with no improvement in surface area. She is approved for  Apligraf but the wound surface still is not completely viable. She has not had to come in for a dressing change 7/8; the patient has an appointment with vein and vascular on 7/31 which is a Friday afternoon. She is concerned about getting back here for [place] to dress her wounds. I think it is important to have them goal for her venous reflux/history of ablations etc. to see if anything else can be done. She apparently tested  positive for 1 of the blood tests with regards to lupus and saw a rheumatologist. He has raised the issue of vasculitis again. I have had this thought in the past however the evidence seems overwhelming that this is a venous reflux etiology. If the rheumatologist tells me there is clinical and laboratory investigation is positive for lupus I will rethink this. 7/15; the patient's wound surfaces are quite a bit better. The medial area which was her original wound now has no depth although the lateral wound which was the more recent area actually appears larger. Both with viable surfaces which is indeed better. Using Sorbact. I wanted to use Apligraf on her however there is the issue of the vein and vascular appointment on 7/31 at 2:00 in the afternoon which would not allow her to get back to be rewrapped and they would no doubt remove the graft 7/22; the patient's wound surfaces have moderate amount of debris although generally look better. The lateral one is larger with 2 small satellite areas superiorly. We are waiting for her vein and vascular appointment on 7/31. She has been approved for Apligraf which I would like to use after th 7/29; wound surfaces have improved no debridement is required we have been using Sorbact. She sees vein and vascular on Friday with this so question of whether anything can be done to lessen the likelihood of recurrence and/or speed the healing of these areas. She is already had previous ablations. She no doubt has severe venous hypertension 8/5-Patient returns at 1 week, she was in [place] for 3 days by her podiatrist, we have been using so backed to the wound, she has increased pain in both the wounds on the left lower leg especially the more distal one on the lateral aspect 8/12-Patient returns at 1 week and she is agreeable to having debridement in both wounds on her left leg today. We have been using Sorbact, and vascular studies were reviewed at last  visit 8/19; the patient arrives with her wounds fairly clean and no debridement is required. We have used Sorbact which is really done a nice job in cleaning up these very difficult wound surfaces. The patient saw Dr. Donzetta Matters of vascular surgery on 7/31. He did not feel that there was an arterial component. He felt that her treated greater saphenous vein is adequately addressed and that the small saphenous vein did not appear to be involved significantly. She was also noted to have deep venous reflux which is not treatable. Dr. Donzetta Matters mentioned the possibility of a central obstructive component leading to reflux and he offered her central venography. She wanted to discuss this or think about it. I have urged her to go ahead with this. She has had recurrent difficult wounds in these areas which do heal but after months in the clinic. If there is anything that can be done to reduce the likelihood of this I think it is worth it. 9/2 she is still working towards getting follow-up with Dr. Donzetta Matters to schedule her CT. Things are  quite a bit worse venography. I put Apligraf on 2 weeks ago on both wounds on the medial and lateral part of her left lower leg. She arrives in clinic today with 3 superficial additional wounds above the area laterally and one below the wound medially. She describes a lot of discomfort. I think these are probably wrapped injuries. Does not look like she has cellulitis. 07/20/2019 on evaluation today patient appears to be doing somewhat poorly in regard to her lower extremity ulcers. She in fact showed signs of erythema in fact we may even be dealing with an infection at this time. Unfortunately I am unsure if this is just infection or if indeed there may be some allergic reaction that occurred as a result of the Apligraf application. With that being said that would be unusual but nonetheless not impossible in this patient is one who is unfortunately allergic to quite a bit. Currently we have  been using the Sorbact which seems to do as well as anything for her. I do think we may want to obtain a culture today to see if there is anything showing up there that may need to be addressed. 9/16; noted that last week the wounds look worse in 1 week follow-up of the Apligraf. Using Sorbact as of 2 days ago. She arrives with copious amounts of drainage and new skin breakdown on the back of the left calf. The wounds arm more substantial bilaterally. There is a fair amount of swelling in the left calf no overt DVT there is edema present I think in the left greater than right thigh. She is supposed to go on 9/28 for CT venography. The wounds on the medial and lateral calf are worse and she has new skin breakdown posteriorly at least new for me. This is almost developing into a circumferential wound area The Apligraf was taken off last week which I agree with things are not going in the right direction a culture was done we do not have that back yet. She is on Augmentin that she started 2 days ago 9/23; dressing was changed by her nurses on Monday. In general there is no improvement in the wound areas although the area looks less angry than last week. She did get Augmentin for MSSA cultured on the 14th. She still appears to have too much swelling in the left leg even with 3 layer compression 9/30; the patient underwent her procedure on 9/28 by Dr. Donzetta Matters at vascular and vein specialist. She was discovered to have the common iliac vein measuring 12.2 mm but at the level of L4-L5 measured 3 mm. After stenting it measured 10 mm. 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 Rouleaux flow in the common femoral and femoral vein was observed much improved after stenting. We are using silver alginate to the wounds on the medial and lateral ankle on the left. 4 layer compression 10/7; the patient had fluid swelling around her knee and 4 layer compression. At the advice of vein and vascular this was  reduced to 3 layer which she is tolerating better. We have been using silver alginate under 3 layer compression since last Friday 10/14; arrives with the areas on the left ankle looking a lot better. Inflammation in the area also a lot better. She came in for a nurse check on 10/9 10/21; continued nice improvement. Slight improvements in surface area of both the medial and lateral wounds on the left. A lot of the satellite lesions in the  weeping erythema around these from stasis dermatitis is resolved. We have been using silver alginate [name], [name] 10/28; general improvement in the entire wound areas although not a lot of change in dimensions the wound certainly looks better. There is a lot less in terms of venous inflammation. Continue silver alginate this week however look towards Hydrofera Blue next week 11/4; very adherent debris on the medial wound left wound is not as bad. We have been using silver alginate. Change to [facility] today 11/11; very adherent debris on both wound areas. She went to vein and vascular last week and follow-up they put in [place] boot on this today. He says the [facility] was adherent. Wound is definitely not as good as last week. Especially on the left there the satellite lesions look more prominent 11/18; absolutely no better. erythema on lateral aspect with tenderness. 09/30/2019 on evaluation today patient appears to actually be doing better. Dr. Dellia Nims did put her on doxycycline last week which I do believe has helped her at this point. Fortunately there is no signs of active infection at this time. No fevers, chills, nausea, vomiting, or diarrhea. I do believe he may want extend the doxycycline for 7 additional days just to ensure everything does completely cleared up the patient is in agreement with that plan. Otherwise she is going require some sharp debridement today 12/2; patient is completing a 2-week course of doxycycline. I gave her  this empirically for inflammation as well as infection when I last saw her 2 weeks ago. All of this seems to be better. She is using silver alginate she has the area on the medial aspect of the larger area laterally and the 2 small satellite regions laterally above the major wound. 12/9; the patient's wound on the left medial and left lateral calf look really quite good. We have been using silver alginate. She saw vein and vascular in follow-up on 10/09/2019. She has had a previous left greater saphenous vein ablation by Dr. Oscar La in 2016. More recently she underwent a left common iliac vein stent by Dr. 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 The swelling is improved and certainly the wounds have improved. The patient shows [place] today area on the right medial calf there is almost no wound but leaking lymphedema. She says she start this started 3 or 4 days ago. She did not traumatize it. It is not painful. She does not wear compression on that side 12/16; the patient continues to do well laterally. Medially still requiring debridement. The area on the right calf did not materialize to anything and is not currently open. We wrapped this last time. She has support stockings for that leg although I am not sure they are going to provide adequate compression 12/23; the lateral wound looks stable. Medially still requiring debridement for tightly adherent fibrinous debris. We've been using silver alginate. Surface area not any different 12/30; neither wound is any better with regards to surface and the area on the left lateral is larger. I been using silver alginate to the left lateral which look quite good last week and Sorbact to the left medial 11/11/2019. Lateral wound area actually looks better and somewhat smaller. Medial still requires a very aggressive debridement today. We have been using Sorbact on both wound areas 1/13; not much better still adherent debris bilaterally. I been using  Sorbact. She has severe venous hypertension. Probably some degree of dermal fibrosis distally. I wonder whether tighter compression might  help and I am going to try that today. We also need to work on the bioburden 1/20; using Sorbact. She has severe venous hypertension status post stent placement for pelvic vein compression. We applied gentamicin last time to see if we could reduce bioburden I had some discussion with her today about the use of pentoxifylline. This is occasionally used in this setting for wounds with refractory venous insufficiency. However this interacts with Plavix. She tells me that she was put on this after stent placement for 3 months. She will call Dr. Claretha Cooper office to discuss 1/27; we are using gentamicin under Sorbact. 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 She has a stent. Wound medially is measuring smaller this week. Laterally measuring slightly larger although she has some satellite lesions superiorly 2/3; gentamicin under Sorbact under 4-layer compression. 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 She has a stent on Plavix. Her wounds are measuring smaller this week. More substantially laterally where there is a satellite lesion superiorly. 2/10; gentamicin under Sorbac. 4-layer compression. Patient communicated with Dr. Donzetta Matters at vein and vascular in [place]. He is okay with the patient coming off Plavix I will therefore start her on pentoxifylline for a 1 month trial. In general her wounds look better today. I had some concerns about swelling in the left thigh however she measures 61.5 on the right and 63 on the mid thigh which does not suggest there is any difficulty. The patient is not describing any pain. 2/17; gentamicin under [place] 4-layer compression. She has been on pentoxifylline for 1 week and complains of loose stool. No nausea she is eating and drinking well 2/24; the patient apparently came in 2  days ago for a nurse visit when her wrap fell down. Both areas look a little worse this week macerated medially and satellite lesions laterally. Change to silver alginate today 3/3; wounds are larger today especially medially. She also has more swelling in her foot lower leg and I even noted some swelling in her posterior thigh which is tender. I wonder about the patency of her stent. Fortuitously she sees Dr. Claretha Cooper group on Friday 3/10; Mrs. Courtney was seen by vein and vascular on 3/5. The patient underwent ultrasound. There was no evidence of thrombosis involving the IVC no evidence of thrombosis involving the right common iliac vein there is no evidence of thrombosis involving the right external iliac vein the left external vein is also patent. The right common iliac vein stent appears patent bilateral common femoral veins are compressible and appear patent. I was concerned about the left common iliac stent however it looks like this is functional. She has some edema in the posterior thigh that was tender she still has that this week. I also note they had trouble finding the pulses in her left foot and booked her for an ABI baseline in 4 weeks. She will follow up in 6 months for repeat IVC duplex. The patient stopped the pentoxifylline because of diarrhea. It does not look like that was being effective in any case. I have advised her to go back on her aspirin 81 mg tablet, vascular it also suggested this 3/17; comes in today with her wound surfaces a lot better. The excoriations from last week considerably better probably secondary to the TCA. We have been using silver alginate 3/24; comes in today with smaller wounds both medially and laterally. Both required debridement. There are 2 small satellite areas superiorly laterally. She also has a very  odd bandlike area in the mid calf almost looking like there was a weakness in the wrap in a localized area. I would write this off as being this  however anteriorly she has a small raised ballotable area that is very tender almost reminiscent of an abscess but there was no obvious purulent surface to it. 02/04/20 upon evaluation today patient appears to be doing fairly well in regard to her wounds today. Fortunately there is no signs of active infection at this time. No fevers, chills, nausea, vomiting, or diarrhea. She has been tolerating the dressing changes without complication. Fortunately I feel like she is showing signs of improvement although has been sometime since have seen her. Nonetheless the area of concern that Dr. Dellia Nims had last week where she had possibly an area of the wrap that was we can allow the leg to bulge appears to be doing significantly better today there is no signs of anything worsening. [name], [name] 4/7; the patient's wounds on her medial and lateral left leg continue to contract. We have been using a regular alginate. Last week she developed an area on the right medial lower leg which is probably a venous ulcer as well. 4/14; the wounds on her left medial and lateral lower leg continue to contract. Surface eschar. We have been using regular alginate. The area on the right medial lower leg is closed. We have been putting both legs under 4-layer contraction. The patient went back to see vein and vascular she had arterial studies done which were apparently "quite good" per the patient although I have not read their notes I have never felt she had an arterial issue. 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 This is been longstanding and refractory to exercise, leg elevation and longstanding use of compression wraps in our clinic as well as compression stockings on the times we have been able to get these to heal 4/21; we thought she actually might be close this week however she arrives in clinic with a lot of edema in her upper left calf and into  her posterior thigh. This is been an intermittent problem here. She says the wrap fell down but it was replaced with a nurse visit on Monday. We are using calcium alginate to the wounds and the wound sizes there not terribly larger than last week but there is a lot more edema 4/28; again wound edges are smaller on both sides. Her edema is better controlled than last time. She is obtained her compression pumps from [employer] although they have not been to her home to set these up. 5/5; left medial and left lateral both look stable. I am not sure the medial is any smaller. We have been using calcium alginate under 4-layer compression. She had an area on the right medial. This was eschared today. We have been wrapping this as well. She does not tolerate external compression stockings due to a history of various contact allergies. She has her compression pumps however the representative from the company is coming on her to show her how to use these tomorrow 5/19; patient with severe chronic venous insufficiency secondary to central venous disease. She had a stent placed in her left common iliac vein. She has done better since but still difficult to control wounds. She comes in today with nothing open on the right leg. Her areas on the left medial and left lateral are just about closed. We are using calcium alginate under 4-layer  compression. She is using her external compression pumps at home She only has 15-20 support stockings. States she cannot get anything tighter than that on. 03/30/20-Patient returns at 1 week, the wounds on the left leg are both slightly bigger, the last week she was on 3 layer compression which started to slide down. She is starting to use her lymphedema pumps although she stated on 1 day her right ankle started to swell up and she have to stop that day. Unfortunately the open area seem to oscillate between improving to the point of healing and then flaring up all to do  with effectiveness of compression or lack of due to the left leg topography not keeping the compression wraps from rolling down Objective Constitutional alert and oriented x 3. sitting or standing blood pressure is within target range for patient.. supine blood pressure is within target range for patient.. pulse regular and within target range for patient.Marland Kitchen respirations regular, non-labored and within target range for patient.Marland Kitchen temperature within target range for patient.. [name] and well-hydrated in no acute distress. Vitals Time Taken: 12:50 PM, Height: 63 in, Weight: 224.7 lbs, BMI: 39.8, Temperature: 98.2 F, Pulse: 69 bpm, Respiratory Rate: 18 breaths/min, Blood Pressure: 144/63 mmHg. General Notes: Left medial and left lateral wounds clean base, surrounding skin intact, left leg does have fair amount of edema that is champagne glass shaped Integumentary (Hair, Skin) Wound #5 status is Open. Original cause of wound was Gradually Appeared. The wound is located on the Left,Medial Lower Leg. The wound measures 1.9cm length x 1.5cm width x 0.1cm depth; 2.238cm^2 area and 0.224cm^3 volume. There is Fat Layer (Subcutaneous Tissue) Exposed exposed. There is no tunneling or undermining noted. There is a medium amount of serous drainage noted. The wound margin is flat and intact. There is medium (34-66%) pink granulation within the wound bed. There is a medium (34-66%) amount of necrotic tissue within the wound bed including Eschar and Adherent Slough. Wound #6 status is Open. Original cause of wound was Gradually Appeared. The wound is located on the Left,Lateral Lower Leg. The wound measures 2cm length x 2cm width x 0.1cm depth; 3.142cm^2 area and 0.314cm^3 volume. There is Fat Layer (Subcutaneous Tissue) Exposed exposed. There is a medium amount of serous drainage noted. The wound margin is flat and intact. There is medium (34-66%) pink granulation within the wound bed. There is a medium  (34-66%) amount of necrotic tissue within the wound bed including Eschar and Adherent Slough. Procedures Wound #5 Pre-procedure diagnosis of Wound #5 is a Lymphedema located on the Left,Medial Lower Leg . There was a Four Layer Compression Therapy Procedure with a pre-treatment ABI of 1 by Montey Hora, RN. Post procedure Diagnosis Wound #5: Same as Pre-Procedure TRISTON, BLAKEY ([id number]) Plan Anesthetic (add to Medication List): Wound #5 Left,Medial Lower Leg: Topical Lidocaine 4% cream applied to wound bed prior to debridement (In Clinic Only). Wound #6 Left,Lateral Lower Leg: Topical Lidocaine 4% cream applied to wound bed prior to debridement (In Clinic Only). Skin Barriers/Peri-Wound Care: Wound #5 Left,Medial Lower Leg: Triamcinolone Acetonide Ointment (TCA) - peri-wound Wound #6 Left,Lateral Lower Leg: Triamcinolone Acetonide Ointment (TCA) - peri-wound Primary Wound Dressing: Wound #5 Left,Medial Lower Leg: Alginate Wound #6 Left,Lateral Lower Leg: Alginate Secondary Dressing: Wound #5 Left,Medial Lower Leg: ABD pad Wound #6 Left,Lateral Lower Leg: ABD pad Dressing Change Frequency: Wound #5 Left,Medial Lower Leg: Change dressing every week Other: - Nurse visit Friday and Monday if needed. Wound #6 Left,Lateral Lower Leg: Change dressing  every week Other: - Nurse visit Friday and Monday if needed. Follow-up Appointments: Wound #5 Left,Medial Lower Leg: Return Appointment in 1 week. Nurse Visit as needed Wound #6 Left,Lateral Lower Leg: Return Appointment in 1 week. Nurse Visit as needed Edema Control: Wound #5 Left,Medial Lower Leg: 3 Layer Compression System - Left Lower Extremity Patient to wear own compression stockings - On Right Compression Pump: Use compression pump on left lower extremity for 60 minutes, twice daily. Compression Pump: Use compression pump on right lower extremity for 60 minutes, twice daily. Wound #6 Left,Lateral Lower Leg: 3 Layer  Compression System - Left Lower Extremity Patient to wear own compression stockings - On Right Compression Pump: Use compression pump on left lower extremity for 60 minutes, twice daily. Compression Pump: Use compression pump on right lower extremity for 60 minutes, twice daily. 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 Date of Service: 03/30/2020 Medical Record Number: [id number] Patient Account Number: [id number] Date of Birth/Sex: 1946/09/03 (74 y.o. 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

## 2020-04-01 DIAGNOSIS — M19071 Primary osteoarthritis, right ankle and foot: Secondary | ICD-10-CM | POA: Diagnosis not present

## 2020-04-06 ENCOUNTER — Telehealth: Payer: Self-pay

## 2020-04-06 ENCOUNTER — Encounter: Payer: Medicare Other | Attending: Internal Medicine | Admitting: Internal Medicine

## 2020-04-06 ENCOUNTER — Other Ambulatory Visit: Payer: Self-pay

## 2020-04-06 DIAGNOSIS — I872 Venous insufficiency (chronic) (peripheral): Secondary | ICD-10-CM | POA: Diagnosis not present

## 2020-04-06 DIAGNOSIS — J45909 Unspecified asthma, uncomplicated: Secondary | ICD-10-CM | POA: Diagnosis not present

## 2020-04-06 DIAGNOSIS — Z86718 Personal history of other venous thrombosis and embolism: Secondary | ICD-10-CM | POA: Diagnosis not present

## 2020-04-06 DIAGNOSIS — I10 Essential (primary) hypertension: Secondary | ICD-10-CM | POA: Diagnosis not present

## 2020-04-06 DIAGNOSIS — G4733 Obstructive sleep apnea (adult) (pediatric): Secondary | ICD-10-CM | POA: Insufficient documentation

## 2020-04-06 DIAGNOSIS — L97211 Non-pressure chronic ulcer of right calf limited to breakdown of skin: Secondary | ICD-10-CM | POA: Diagnosis not present

## 2020-04-06 DIAGNOSIS — Z7902 Long term (current) use of antithrombotics/antiplatelets: Secondary | ICD-10-CM | POA: Diagnosis not present

## 2020-04-06 DIAGNOSIS — G629 Polyneuropathy, unspecified: Secondary | ICD-10-CM | POA: Diagnosis not present

## 2020-04-06 DIAGNOSIS — L97221 Non-pressure chronic ulcer of left calf limited to breakdown of skin: Secondary | ICD-10-CM | POA: Insufficient documentation

## 2020-04-06 DIAGNOSIS — M1990 Unspecified osteoarthritis, unspecified site: Secondary | ICD-10-CM | POA: Insufficient documentation

## 2020-04-06 DIAGNOSIS — I890 Lymphedema, not elsewhere classified: Secondary | ICD-10-CM | POA: Insufficient documentation

## 2020-04-06 DIAGNOSIS — I87332 Chronic venous hypertension (idiopathic) with ulcer and inflammation of left lower extremity: Secondary | ICD-10-CM | POA: Diagnosis not present

## 2020-04-06 DIAGNOSIS — M329 Systemic lupus erythematosus, unspecified: Secondary | ICD-10-CM | POA: Insufficient documentation

## 2020-04-06 NOTE — Telephone Encounter (Signed)
Pt called with c/o intermittent L calf swelling. She is going to [facility] and they suggested she call to f/u with Donzetta Matters regarding left calf.  Pt states the swelling has been ongoing intermittently for months but has started to come more frequently and last longer. Appt has been made for her and she is aware and will call us back if anything changes/worsens.

## 2020-04-07 ENCOUNTER — Other Ambulatory Visit: Payer: Self-pay

## 2020-04-07 ENCOUNTER — Encounter: Payer: Self-pay | Admitting: Podiatry

## 2020-04-07 ENCOUNTER — Ambulatory Visit (INDEPENDENT_AMBULATORY_CARE_PROVIDER_SITE_OTHER): Payer: Medicare Other | Admitting: Podiatry

## 2020-04-07 VITALS — Temp 98.3°F

## 2020-04-07 DIAGNOSIS — I872 Venous insufficiency (chronic) (peripheral): Secondary | ICD-10-CM | POA: Diagnosis not present

## 2020-04-07 DIAGNOSIS — B351 Tinea unguium: Secondary | ICD-10-CM | POA: Diagnosis not present

## 2020-04-07 DIAGNOSIS — M79675 Pain in left toe(s): Secondary | ICD-10-CM

## 2020-04-07 DIAGNOSIS — M79674 Pain in right toe(s): Secondary | ICD-10-CM

## 2020-04-07 DIAGNOSIS — R768 Other specified abnormal immunological findings in serum: Secondary | ICD-10-CM

## 2020-04-07 NOTE — Progress Notes (Signed)
JACQUELEEN, WERMAN ([id number]) Visit Report for 04/06/2020 HPI Details Patient Name: [name], [name]. Date of Service: 04/06/2020 3:30 PM Medical Record Number: [id number] Patient Account Number: [id number] Date of Birth/Sex: 03-12-1946 (74 y.o. F) Treating RN: Cornell Barman Primary Care Provider: [name] Other Clinician: Referring Provider: [name] Treating Provider/Extender: Tito Dine in Treatment: 46 History of Present Illness HPI Description: Pleasant 75 year old with history of chronic venous insufficiency. No diabetes or peripheral vascular disease. Left ABI 1.29. Questionable history of left lower extremity DVT. She developed a recurrent ulceration on her left lateral calf in December 2015, which she attributes to poor diet and subsequent lower extremity edema. She underwent endovenous laser ablation of her left greater saphenous vein in 2010. She underwent laser ablation of accessory branch of left GSV in April 2016 by Dr. Kellie Simmering at [facility]. She was previously wearing Unna boots, which she tolerated well. Tolerating 2 layer compression and cadexomer iodine. She returns to clinic for follow-up and is without new complaints. She denies any significant pain at this time. She reports persistent pain with pressure. No claudication or ischemic rest pain. No fever or chills. No drainage. READMISSION 11/13/16; this is a 74 year old woman who is not a diabetic. She is here for a review of a painful area on her left medial lower extremity. I note that she was seen here previously last year for wound I believe to be in the same area. At that time she had undergone previously a left greater saphenous vein ablation by Dr. Kellie Simmering and she had a ablation of the anterior accessory branch of the left greater saphenous vein in March 2016. Seeing that the wound actually closed over. In reviewing the history with her today the ulcer in this area has been recurrent. She describes  a biopsy of this area in 2009 that only showed stasis physiology. 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 Do not see that she has ever had arterial studies in the left leg. She tells me she has a follow-up with Dr. Kellie Simmering in roughly 10 days In any case she developed the reopening of this area roughly a month ago. On the background of this she describes rapidly increasing edema which has responded to Lasix 40 mg and metolazone 2.5 mg as well as the patient's lymph massage. She has been told she has both venous insufficiency and lymphedema but she cannot tolerate compression stockings 11/28/16; the patient saw Dr. Kellie Simmering recently. Per the patient he did arterial Dopplers in the office that did not show evidence of arterial insufficiency, per the patient he stated "treat this like an ordinary venous ulcer". She also saw her dermatologist Dr. Ronnald Ramp who felt that this was more of a vascular ulcer. In general things are improving although she arrives today with increasing bilateral lower extremity edema with weeping a deeper fluid through the wound on the left medial leg compatible with some degree of lymphedema 12/04/16; the patient's wound is fully epithelialized but I don't think fully healed. We will do another week of depression with Promogran and TCA however I suspect we'll be able to discharge her next week. This is a very unusual-looking wound which was initially a figure-of-eight type wound lying on its side surrounded by petechial like hemorrhage. She  has had venous ablation on this side. She apparently does not have an arterial issue per Dr. Kellie Simmering. She saw her dermatologist thought it was "vascular". Patient is definitely going to need ongoing compression and I talked about  this with her today she will go to elastic therapy after she leaves here next week 12/11/16; the patient's wound is not completely closed today. She has surrounding scar tissue and in further discussion with the patient it would appear that she had ulcers in this area in 2009 for a prolonged period of time ultimately requiring a punch biopsy of this area that only showed venous insufficiency. I did not previously pickup on this part of the history from the patient. 12/18/16; the patient's wound is completely epithelialized. There is no open area here. She has significant bilateral venous insufficiency with secondary lymphedema to a mild-to-moderate degree she does not have compression stockings.. She did not say anything to me when I was in the room, she told our intake nurse that she was still having pain in this area. This isn't unusual recurrent small open area. She is going to go to elastic therapy to obtain compression stockings. 12/25/16; the patient's wound is fully epithelialized. There is no open area here. The patient describes some continued episodic discomfort in this area medial left calf. However everything looks fine and healed here. She is been to elastic therapy and caught herself 15-20 mmHg stockings, they apparently were having trouble getting 20-30 mm stockings in her size 01/22/17; this is a patient we discharged from the clinic a month ago. She has a recurrent open wound on her medial left calf. She had 15 mm support stockings. I told her I thought she needed 20-30 mm compression stockings. She tells me that she has been ill with hospitalization secondary to asthma and is been found to have severe hypokalemia likely secondary to a combination of Lasix and metolazone. This morning she noted blistering and leaking fluid on the posterior part of her left leg. She called our intake nurse urgently and we was saw her this afternoon. She has not had any real discomfort here. I don't know  that she's been wearing any stockings on this leg for at least 2-3 days. ABIs in this clinic were 1.21 on the right and 1.3 on the left. She is previously seen vascular surgery who does not think that there is a peripheral arterial issue. 01/30/17; Patient arrives with no open wound on the left leg. She has been to elastic therapy and obtained 20-39mmhg below knee stockings and she has one on the right leg today. READMISSION 02/19/18; this Pitzer is a now 74 year old patient we've had in this clinic perhaps 3 times before. I had last looked at her from January 07 December 2016 with an area on the medial left leg. We discharged her on 12/25/16 however she had to be readmitted on 01/22/17 with a recurrence. I have in my notes that we discharged her on 20-30 mm stockings although she tells me she was only wearing support hose because she cannot get stockings on predominantly related to her cervical spine surgery/issues. She has had previous ablations done by vein and vascular in [place] including a great saphenous vein ablation on the left with an anterior accessory branch ablation I think both of these were in 2016. On one of the previous visit she had a biopsy noted 2009 that was negative. She is not felt to have an arterial issue. She is not a diabetic. She does  have a history of obstructive sleep apnea hypertension asthma as well as chronic venous insufficiency and lymphedema. On this occasion she noted 2 dry scaly patch on her left leg. She tried to put lotion on this it didn't really help. There were 2 open areas.the KARYSSA, AMARAL ([id number]) patient has been seeing her primary physician from 02/05/18 through 02/14/18. She had Unna boots applied. The superior wound now on the lateral left leg has closed but she's had one wound that remains open on the lateral left leg. This is not the same spot as we dealt with in 2018. ABIs in this clinic were 1.3 bilaterally 02/26/18; patient has a small wound on  the left lateral calf. Dimensions are down. She has chronic venous insufficiency and lymphedema. 03/05/18; small open area on the left lateral calf. Dimensions are down. Tightly adherent necrotic debris over the surface of the wound which was difficult to remove. Also the dressing [over collagen] stuck to the wound surface. This was removed with some difficulty as well. Change the primary dressing to Hydrofera Blue ready 03/12/18; small open area on the left lateral calf. Comes in with tightly adherent surface eschar as well as some adherent Hydrofera Blue. 03/19/18; open area on the left lateral calf. Again adherent surface eschar as well as some adherent Hydrofera Blue nonviable subcutaneous tissue. She complained of pain all week even with the reduction from 4-3 layer compression I put on last week. Also she had an increase in her ankle and calf measurements probably related to the same thing. 03/26/18; open area on the left lateral calf. A very small open area remains here. We used silver alginate starting last week as the Hydrofera Blue seem to stick to the wound bed. In using 4-layer compression 04/02/18; the open area in the left lateral calf at some adherent slough which I removed there is no open area here. We are able to transition her into her own compression stocking. Truthfully I think this is probably his support hose. However this does not maintain skin integrity will be limited. She cannot put over the toe compression stockings on because of neck problems hand problems etc. She is allergic to the lining layer of juxta lites. We might be forced to use extremitease stocking should this fail READMIT 11/24/2018 Patient is now a 74 year old woman who is not a diabetic. She has been in this clinic on at least 3 previous occasions largely with recurrent wounds on her left leg secondary to chronic venous insufficiency with secondary lymphedema. Her situation is complicated by inability to  get stockings on and an allergy to neoprene which is apparently a component and at least juxta lites and other stockings. As a result she really has not been wearing any stockings on her legs. She tells [place] that roughly 2 or 3 weeks ago she started noticing a stinging sensation just above her ankle on the left medial aspect. She has been diagnosed with pseudogout and she wondered whether this was what she was experiencing. She tried to dress this with something she bought at the store however subsequently it pulled skin off and now she has an open wound that is not improving. She has been using Vaseline gauze with a cover bandage. She saw her primary doctor last week who put an Haematologist on her. ABIs in this clinic was 1.03 on the left 2/12; the area is on the left medial ankle. Odd-looking wound with what looks to be surface epithelialization but a multitude of  small petechial openings. This clearly not closed yet. We have been using silver alginate under 3 layer compression with TCA 2/19; the wound area did not look quite as good this week. Necrotic debris over the majority of the wound surface which required debridement. She continues to have a multitude of what looked to be small petechial openings. She reminds [place] that she had a biopsy on this initially during her first outbreak in 2015 in [place] dermatology. She expresses concern about this being a possible melanoma. 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 I assured her that this does not look anything like melanoma. Except for the petechial reaction it does look like a venous insufficiency area and she certainly has evidence of this on both sides 2/26; a difficult area on the left medial ankle. The patient clearly has chronic venous hypertension with some degree of lymphedema. The odd thing about the area is the small petechial hemorrhages. I am not  really sure how to explain this. This was present last time and this is not a compression injury. We have been using Hydrofera Blue which I changed to last week 3/4; still using Hydrofera Blue. Aggressive debridement today. She does not have known arterial issues. She has seen Dr. Kellie Simmering at [facility] vein and vascular and and has an ablation on the left. [Anterior accessory branch of the greater saphenous]. From what I remember they did not feel she had an arterial issue. The patient has had this area biopsied in 2009 at [facility] dermatology and by her recollection they said this was "stasis". She is also follow-up with dermatology locally who thought that this was more of a vascular issue 3/11; using Hydrofera Blue. Aggressive debridement today. She does not have an arterial issue. We are using 3 layer compression although we may need to go to 4. The patient has been in for multiple changes to her wrap since I last saw her a week ago. She says that the area was leaking. I do not have too much more information on what was found 01/19/19 on evaluation today patient was actually being seen for a nurse visit when unfortunately she had the area on her left lateral lower extremity as well as weeping from the right lower extremity that became apparent. Therefore we did end up actually seeing her for a full visit with myself. She is having some pain at this site as well but fortunately nothing too significant at this point. No fevers, chills, nausea, or vomiting noted at this time. 3/18-Patient is back to the clinic with the left leg venous leg ulcer, the ulcer is larger in size, has a surface that is densely adherent with fibrinous tissue, the Hydrofera Blue was used but is densely adherent and there was difficulty in removing it. The right lower extremity was also wrapped for weeping edema. Patient has a new area over the left lateral foot above the malleolus that is small and appears to have no debris  with intact surrounding skin. Patient is on increased dose of Lasix also as a means to edema management 3/25; the patient has a nonhealing venous ulcer on the medial left leg and last week developed a smaller area on the lateral left calf. We have been using Hydrofera Blue with a contact layer. 4/1; no major change in these wounds areas. Left medial and more recently left lateral calf. I tried Iodoflex last week to aid in debridement  she did not tolerate this. She stated her pain was terrible all week. She took the top layer of the 4 layer compression off. 4/8; the patient actually looks somewhat better in terms of her more prominent left lateral calf wound. There is some healthy looking tissue here. She is still complaining of a lot of discomfort. 4/15; patient in a lot of pain secondary to sciatica. She is on a prednisone taper prescribed by her primary physician. She has the 2 areas one on the left medial and more recently a smaller area on the left lateral calf. Both of these just above the malleoli 4/22; her back pain is better but she still states she is very uncomfortable and now feels she is intolerant to the [employer]. No real change in the wounds we have been using Sorbact. She has been previously intolerant to Iodoflex. There is not a lot of option about what we can use to debride this wound under compression that she no doubt needs. sHe states Ultram no longer works for her pain 4/29; no major change in the wounds slightly increased depth. Surface on the original medial wound perhaps somewhat improved however the more recent area on the lateral left ankle is 100% covered in very adherent debris we have been using Sorbact. She tolerates 4 layer compression well and her edema control is a lot better. She has not had to come in for a nurse check 5/6; no major change in the condition of the wounds. She did consent to debridement today which was done with some difficulty. Continuing Sorbact.  She did not tolerate Iodoflex. She was in for a check of her compression the day after we wrapped her last week this was adjusted but nothing much was found 5/13; no major change in the condition or area of the wounds. I was able to get a fairly aggressive debridement done on the lateral left leg wound. Even using Sorbact under compression. She came back in on Friday to have the wrap changed. She says she felt uncomfortable on the TANAE, PETROSKY. ([id number]) lateral aspect of her ankle. She has a long history of chronic venous insufficiency including previous ablation surgery on this side. 5/20-Patient returns for wounds on left leg with both wounds covered in slough, with the lateral leg wound larger in size, she has been in 3 layer compression and felt more comfortable, she describes pain in ankle, in leg and pins and needles in foot, and is about to try Pamelor for this 6/3; wounds on the left lateral and left medial leg. The area medially which is the most recent of the 2 seems to have had the largest increase in dimensions. We have been using Sorbac to try and debride the surface. She has been to see orthopedics they apparently did a plain x-ray that was indeterminant. Diagnosed her with neuropathy and they have ordered an MRI to determine if there is underlying osteomyelitis. This was not high on my thought list but I suppose it is prudent. We have advised her to make an appointment with vein and vascular in [place]. She has a history of a left greater saphenous and accessory vein ablations I wonder if there is anything else that can be done from a surgical point of view to help in these difficult refractory wounds. We have previously healed this wound on one occasion but it keeps on reopening [medial side] 6/10; deep tissue culture I did last week I think on the left medial wound showed  both moderate E. coli and moderate staph aureus [MSSA]. She is going to require antibiotics and I have  chosen Augmentin. We have been using Sorbact and we have made better looking wound surface on both sides but certainly no improvement in wound area. She was back in last Friday apparently for a dressing changes the wrap was hurting her outer left ankle. She has not managed to get a hold of vein and vascular in [place]. We are going to have to make her that appointment 6/17; patient is tolerating the Augmentin. She had an MRI that I think was ordered by orthopedic surgeon this did not show osteomyelitis or an abscess did suggest cellulitis. We have been using Sorbact to the lateral and medial ankles. We have been trying to arrange a follow-up appointment with vein and vascular in [name] or did her original ablations. We apparently an area sent the request to vein and vascular in [facility] 6/24; patient has completed the Augmentin. We do not yet have a vein and vascular appointment in [place]. I am not sure what the issue is here we have asked her to call tomorrow. We are using Sorbact. Making some improvements and especially the medial wound. Both surfaces however look better medial and lateral. 7/1; the patient has been in contact with vein and vascular in [place] but has not yet received an appointment. Using Sorbact we have gradually improve the wound surface with no improvement in surface area. She is approved for Apligraf but the wound surface still is not completely viable. She has not had to come in for a dressing change 7/8; the patient has an appointment with vein and vascular on 7/31 which is a Friday afternoon. She is concerned about getting back here for [place] to dress her wounds. I think it is important to have them goal for her venous reflux/history of ablations etc. to see if anything else can be done. She apparently tested positive for 1 of the blood tests with regards to lupus and saw a rheumatologist. He has raised the issue of vasculitis again. I have had this thought in  the past however the evidence seems overwhelming that this is a venous reflux etiology. If the rheumatologist tells me there is clinical and laboratory investigation is positive for lupus I will rethink this. 7/15; the patient's wound surfaces are quite a bit better. The medial area which was her original wound now has no depth although the lateral wound which was the more recent area actually appears larger. Both with viable surfaces which is indeed better. Using Sorbact. I wanted to use Apligraf on her however there is the issue of the vein and vascular appointment on 7/31 at 2:00 in the afternoon which would not allow her to get back to be rewrapped and they would no doubt remove the graft 7/22; the patient's wound surfaces have moderate amount of debris although generally look better. The lateral one is larger with 2 small satellite areas superiorly. We are waiting for her vein and vascular appointment on 7/31. She has been approved for Apligraf which I would like to use after th 7/29; wound surfaces have improved no debridement is required we have been using Sorbact. She sees vein and vascular on Friday with this so question of whether anything can be done to lessen the likelihood of recurrence and/or speed the healing of these areas. She is already had previous ablations. She no doubt has severe venous hypertension 8/5-Patient returns at 1 week, she was in [employer]  for 3 days by her podiatrist, we have been using so backed to the wound, she has increased pain in both the wounds on the left lower leg especially the more distal one on the lateral aspect 8/12-Patient returns at 1 week and she is agreeable to having debridement in both wounds on her left leg today. We have been using Sorbact, and vascular studies were reviewed at last visit 8/19; the patient arrives with her wounds fairly clean and no debridement is required. We have used Sorbact which is really done a nice job in cleaning up  these very difficult wound surfaces. The patient saw Dr. Donzetta Matters of vascular surgery on 7/31. He did not feel that there was an arterial component. He felt that her treated greater saphenous vein is adequately addressed and that the small saphenous vein did not appear to be involved significantly. She was also noted to have deep venous reflux which is not treatable. Dr. Donzetta Matters mentioned the possibility of a central obstructive component leading to reflux and he offered her central venography. She wanted to discuss this or think about it. I have urged her to go ahead with this. She has had recurrent difficult wounds in these areas which do heal but after months in the clinic. If there is anything that can be done to reduce the likelihood of this I think it is worth it. 9/2 she is still working towards getting follow-up with Dr. Donzetta Matters to schedule her CT. Things are quite a bit worse venography. I put Apligraf on 2 weeks ago on both wounds on the medial and lateral part of her left lower leg. She arrives in clinic today with 3 superficial additional wounds above the area laterally and one below the wound medially. She describes a lot of discomfort. I think these are probably wrapped injuries. Does not look like she has cellulitis. 07/20/2019 on evaluation today patient appears to be doing somewhat poorly in regard to her lower extremity ulcers. She in fact showed signs of erythema in fact we may even be dealing with an infection at this time. Unfortunately I am unsure if this is just infection or if indeed there may be some allergic reaction that occurred as a result of the Apligraf application. With that being said that would be unusual but nonetheless not impossible in this patient is one who is unfortunately allergic to quite a bit. Currently we have been using the Sorbact which seems to do as well as anything for her. I do think we may want to obtain a culture today to see if there is anything showing up  there that may need to be addressed. 9/16; noted that last week the wounds look worse in 1 week follow-up of the Apligraf. Using Sorbact as of 2 days ago. She arrives with copious amounts of drainage and new skin breakdown on the back of the left calf. The wounds arm more substantial bilaterally. There is a fair amount of swelling in the left calf no overt DVT there is edema present I think in the left greater than right thigh. She is supposed to go on 9/28 for CT venography. The wounds on the medial and lateral calf are worse and she has new skin breakdown posteriorly at least new for me. This is almost developing into a circumferential wound area The Apligraf was taken off last week which I agree with things are not going in the right direction a culture was done we do not have that back yet. She is  on Augmentin that she started 2 days ago 9/23; dressing was changed by her nurses on Monday. In general there is no improvement in the wound areas although the area looks less angry than last week. She did get Augmentin for MSSA cultured on the 14th. She still appears to have too much swelling in the left leg even with 3 layer compression 9/30; the patient underwent her procedure on 9/28 by Dr. Donzetta Matters at [facility]. She was discovered to have the common iliac vein measuring 12.2 mm but at the level of L4-L5 measured 3 mm. After stenting it measured 10 mm. 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 Rouleaux flow in the common femoral and femoral vein was observed much improved after stenting. We are using silver alginate to the wounds on the medial and lateral ankle on the left. 4 layer compression 10/7; the patient had fluid swelling around her knee and 4 layer compression. At the advice of vein and vascular this was reduced to 3 layer which she is tolerating better. We have been using silver alginate under 3 layer compression since last Friday 10/14; arrives with the  areas on the left ankle looking a lot better. Inflammation in the area also a lot better. She came in for a nurse check on RODNEY, WIGGER ([id number]) 10/9 10/21; continued nice improvement. Slight improvements in surface area of both the medial and lateral wounds on the left. A lot of the satellite lesions in the weeping erythema around these from stasis dermatitis is resolved. We have been using silver alginate 10/28; general improvement in the entire wound areas although not a lot of change in dimensions the wound certainly looks better. There is a lot less in terms of venous inflammation. Continue silver alginate this week however look towards Hydrofera Blue next week 11/4; very adherent debris on the medial wound left wound is not as bad. We have been using silver alginate. Change to [facility] today 11/11; very adherent debris on both wound areas. She went to vein and vascular last week and follow-up they put in [place] boot on this today. He says the [facility] was adherent. Wound is definitely not as good as last week. Especially on the left there the satellite lesions look more prominent 11/18; absolutely no better. erythema on lateral aspect with tenderness. 09/30/2019 on evaluation today patient appears to actually be doing better. Dr. Dellia Nims did put her on doxycycline last week which I do believe has helped her at this point. Fortunately there is no signs of active infection at this time. No fevers, chills, nausea, vomiting, or diarrhea. I do believe he may want extend the doxycycline for 7 additional days just to ensure everything does completely cleared up the patient is in agreement with that plan. Otherwise she is going require some sharp debridement today 12/2; patient is completing a 2-week course of doxycycline. I gave her this empirically for inflammation as well as infection when I last saw her 2 weeks ago. All of this seems to be better. She is using silver alginate she  has the area on the medial aspect of the larger area laterally and the 2 small satellite regions laterally above the major wound. 12/9; the patient's wound on the left medial and left lateral calf look really quite good. We have been using silver alginate. She saw vein and vascular in follow-up on 10/09/2019. She has had a previous left greater saphenous vein ablation by Dr. Oscar La in 2016.  More recently she underwent a left common iliac vein stent by Dr. 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 The swelling is improved and certainly the wounds have improved. The patient shows [place] today area on the right medial calf there is almost no wound but leaking lymphedema. She says she start this started 3 or 4 days ago. She did not traumatize it. It is not painful. She does not wear compression on that side 12/16; the patient continues to do well laterally. Medially still requiring debridement. The area on the right calf did not materialize to anything and is not currently open. We wrapped this last time. She has support stockings for that leg although I am not sure they are going to provide adequate compression 12/23; the lateral wound looks stable. Medially still requiring debridement for tightly adherent fibrinous debris. We've been using silver alginate. Surface area not any different 12/30; neither wound is any better with regards to surface and the area on the left lateral is larger. I been using silver alginate to the left lateral which look quite good last week and Sorbact to the left medial 11/11/2019. Lateral wound area actually looks better and somewhat smaller. Medial still requires a very aggressive debridement today. We have been using Sorbact on both wound areas 1/13; not much better still adherent debris bilaterally. I been using Sorbact. She has severe venous hypertension. Probably some degree of dermal fibrosis distally. I wonder whether tighter compression might help and I am going  to try that today. We also need to work on the bioburden 1/20; using Sorbact. She has severe venous hypertension status post stent placement for pelvic vein compression. We applied gentamicin last time to see if we could reduce bioburden I had some discussion with her today about the use of pentoxifylline. This is occasionally used in this setting for wounds with refractory venous insufficiency. However this interacts with Plavix. She tells me that she was put on this after stent placement for 3 months. She will call Dr. Claretha Cooper office to discuss 1/27; we are using gentamicin under Sorbact. 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 She has a stent. Wound medially is measuring smaller this week. Laterally measuring slightly larger although she has some satellite lesions superiorly 2/3; gentamicin under Sorbact under 4-layer compression. 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 She has a stent on Plavix. Her wounds are measuring smaller this week. More substantially laterally where there is a satellite lesion superiorly. 2/10; gentamicin under Sorbac. 4-layer compression. Patient communicated with Dr. Donzetta Matters at vein and vascular in [place]. He is okay with the patient coming off Plavix I will therefore start her on pentoxifylline for a 1 month trial. In general her wounds look better today. I had some concerns about swelling in the left thigh however she measures 61.5 on the right and 63 on the mid thigh which does not suggest there is any difficulty. The patient is not describing any pain. 2/17; gentamicin under Sorbac 4-layer compression. She has been on pentoxifylline for 1 week and complains of loose stool. No nausea she is eating and drinking well 2/24; the patient apparently came in 2 days ago for a nurse visit when her wrap fell down. Both areas look a little worse this week macerated medially and satellite lesions laterally. Change to  silver alginate today 3/3; wounds are larger today especially medially. She also has more swelling in her foot lower leg and I even noted some swelling in  her posterior thigh which is tender. I wonder about the patency of her stent. Fortuitously she sees Dr. Claretha Cooper group on Friday 3/10; Mrs. Delio was seen by vein and vascular on 3/5. The patient underwent ultrasound. There was no evidence of thrombosis involving the IVC no evidence of thrombosis involving the right common iliac vein there is no evidence of thrombosis involving the right external iliac vein the left external vein is also patent. The right common iliac vein stent appears patent bilateral common femoral veins are compressible and appear patent. I was concerned about the left common iliac stent however it looks like this is functional. She has some edema in the posterior thigh that was tender she still has that this week. I also note they had trouble finding the pulses in her left foot and booked her for an ABI baseline in 4 weeks. She will follow up in 6 months for repeat IVC duplex. The patient stopped the pentoxifylline because of diarrhea. It does not look like that was being effective in any case. I have advised her to go back on her aspirin 81 mg tablet, vascular it also suggested this 3/17; comes in today with her wound surfaces a lot better. The excoriations from last week considerably better probably secondary to the TCA. We have been using silver alginate 3/24; comes in today with smaller wounds both medially and laterally. Both required debridement. There are 2 small satellite areas superiorly laterally. She also has a very odd bandlike area in the mid calf almost looking like there was a weakness in the wrap in a localized area. I would write this off as being this however anteriorly she has a small raised ballotable area that is very tender almost reminiscent of an abscess but there was no obvious purulent surface to  it. 02/04/20 upon evaluation today patient appears to be doing fairly well in regard to her wounds today. Fortunately there is no signs of active ELLERIE, ARENZ. ([id number]) infection at this time. No fevers, chills, nausea, vomiting, or diarrhea. She has been tolerating the dressing changes without complication. Fortunately I feel like she is showing signs of improvement although has been sometime since have seen her. Nonetheless the area of concern that Dr. Dellia Nims had last week where she had possibly an area of the wrap that was we can allow the leg to bulge appears to be doing significantly better today there is no signs of anything worsening. 4/7; the patient's wounds on her medial and lateral left leg continue to contract. We have been using a regular alginate. Last week she developed an area on the right medial lower leg which is probably a venous ulcer as well. 4/14; the wounds on her left medial and lateral lower leg continue to contract. Surface eschar. We have been using regular alginate. The area on the right medial lower leg is closed. We have been putting both legs under 4-layer contraction. The patient went back to see vein and vascular she had arterial studies done which were apparently "quite good" per the patient although I have not read their notes I have never felt she had an arterial issue. 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 This is been longstanding and refractory to exercise, leg elevation and longstanding use of compression wraps in our clinic as well as compression stockings on the times we have been able to get these to heal 4/21; we thought she actually might be close this week however she arrives in  clinic with a lot of edema in her upper left calf and into her posterior thigh. This is been an intermittent problem here. She says the wrap fell down but it was replaced with a nurse visit on Monday. We are using calcium  alginate to the wounds and the wound sizes there not terribly larger than last week but there is a lot more edema 4/28; again wound edges are smaller on both sides. Her edema is better controlled than last time. She is obtained her compression pumps from [employer] although they have not been to her home to set these up. 5/5; left medial and left lateral both look stable. I am not sure the medial is any smaller. We have been using calcium alginate under 4-layer compression. oShe had an area on the right medial. This was eschared today. We have been wrapping this as well. She does not tolerate external compression stockings due to a history of various contact allergies. She has her compression pumps however the representative from the company is coming on her to show her how to use these tomorrow 5/19; patient with severe chronic venous insufficiency secondary to central venous disease. She had a stent placed in her left common iliac vein. She has done better since but still difficult to control wounds. She comes in today with nothing open on the right leg. Her areas on the left medial and left lateral are just about closed. We are using calcium alginate under 4-layer compression. She is using her external compression pumps at home She only has 15-20 support stockings. States she cannot get anything tighter than that on. 03/30/20-Patient returns at 1 week, the wounds on the left leg are both slightly bigger, the last week she was on 3 layer compression which started to slide down. She is starting to use her lymphedema pumps although she stated on 1 day her right ankle started to swell up and she have to stop that day. Unfortunately the open area seem to oscillate between improving to the point of healing and then flaring up all to do with effectiveness of compression or lack of due to the left leg topography not keeping the compression wraps from rolling down 6/2 patient comes in with a 15/20  mmHg stocking on the right leg. She tells me that she developed a lot of swelling in her ankles she saw orthopedics she was felt to possibly be having a flare of pseudogout versus some other type of arthritis. She was put on steroids for a respiratory issue so that helps with the inflammation. She has not been using the pumps all week. She thinks the left thigh is more swollen than usual and I would agree with that. She has an appointment with Dr. Donzetta Matters 9 days or so from now Electronic Signature(s) Signed: 04/06/2020 4:41:32 PM By: Linton Ham MD Entered By: Linton Ham on 04/06/2020 16:34:29 Babler, [name] ([id number]) -------------------------------------------------------------------------------- Physical Exam Details Patient Name: CONI, NAGAI. Date of Service: 04/06/2020 3:30 PM Medical Record Number: [id number] Patient Account Number: [id number] Date of Birth/Sex: April 18, 1946 (74 y.o. F) Treating RN: Cornell Barman Primary Care Provider: [name] Other Clinician: Referring Provider: [name] Treating Provider/Extender: Tito Dine in Treatment: 69 Cardiovascular Edema present in both extremities. I would agree that the left thigh seems more swollen although there is no pitting edema.. Integumentary (Hair, Skin) No evidence of cellulitis. Notes Wound exam; left medial and left lateral wounds look quite good. There is smaller with a clean base  that does not require debridement. No evidence of surrounding infection Electronic Signature(s) Signed: 04/06/2020 4:41:32 PM By: Linton Ham MD Entered By: Linton Ham on 04/06/2020 16:35:36 [name], [name] -------------------------------------------------------------------------------- Physician Orders Details Patient Name: LOUBERTHA, BELD. Date of Service: 04/06/2020 3:30 PM Medical Record Number: [id number] Patient Account Number: [id number] Date of Birth/Sex: 09-Apr-1946 (74 y.o. F) Treating  RN: Cornell Barman Primary Care Provider: [name] Other Clinician: Referring Provider: [name] Treating Provider/Extender: Tito Dine in Treatment: 26 Verbal / Phone Orders: No Diagnosis Coding Anesthetic (add to Medication List) Wound #5 Left,Medial Lower Leg o Topical Lidocaine 4% cream applied to wound bed prior to debridement (In Clinic Only). Wound #6 Left,Lateral Lower Leg o Topical Lidocaine 4% cream applied to wound bed prior to debridement (In Clinic Only). Skin Barriers/Peri-Wound Care Wound #5 Left,Medial Lower Leg o Triamcinolone Acetonide Ointment (TCA) - peri-wound Wound #6 Left,Lateral Lower Leg o Triamcinolone Acetonide Ointment (TCA) - peri-wound Primary Wound Dressing Wound #5 Left,Medial Lower Leg o Alginate Wound #6 Left,Lateral Lower Leg o Alginate Secondary Dressing Wound #5 Left,Medial Lower Leg o ABD pad Wound #6 Left,Lateral Lower Leg o ABD pad Dressing Change Frequency Wound #5 Left,Medial Lower Leg o Change dressing every week o Other: - Nurse visit Friday and Monday if needed. Wound #6 Left,Lateral Lower Leg o Change dressing every week o Other: - Nurse visit Friday and Monday if needed. Follow-up Appointments Wound #5 Left,Medial Lower Leg o Return Appointment in 1 week. o Nurse Visit as needed Wound #6 Left,Lateral Lower Leg o Return Appointment in 1 week. o Nurse Visit as needed Edema Control Wound #5 Left,Medial Lower Leg o 3 Layer Compression System - Left Lower Extremity o Patient to wear own compression stockings - On Right o Compression Pump: Use compression pump on left lower extremity for 60 minutes, twice daily. o Compression Pump: Use compression pump on right lower extremity for 60 minutes, twice daily. Wound #6 Left,Lateral Lower Leg o 3 Layer Compression System - Left Lower Extremity JEENA, MANU ([id number]) o Patient to wear own compression  stockings - On Right o Compression Pump: Use compression pump on left lower extremity for 60 minutes, twice daily. o Compression Pump: Use compression pump on right lower extremity for 60 minutes, twice daily. Electronic Signature(s) Signed: 04/06/2020 4:41:32 PM By: Linton Ham MD Signed: 04/07/2020 1:40:27 PM By: [name], [name], RN, CWS, [name], [name] Entered By: [name], [name], RN, CWS, Kim on 04/06/2020 [place], [place] ([id number]) -------------------------------------------------------------------------------- Problem List Details Patient Name: LATRONDA, MARKOSKI. Date of Service: 04/06/2020 3:30 PM Medical Record Number: [id number] Patient Account Number: [id number] Date of Birth/Sex: 12/11/1945 (74 y.o. F) Treating RN: Cornell Barman Primary Care Provider: [name] Other Clinician: Referring Provider: [name] Treating Provider/Extender: Tito Dine in Treatment: 69 Active Problems ICD-10 Encounter Code Description Active Date MDM Diagnosis L97.221 Non-pressure chronic ulcer of left calf limited to breakdown of skin 01/07/2019 No Yes I87.332 Chronic venous hypertension (idiopathic) with ulcer and inflammation of 12/09/2019 No Yes left lower extremity L97.211 Non-pressure chronic ulcer of right calf limited to breakdown of skin 02/10/2020 No Yes I89.0 Lymphedema, not elsewhere classified 12/10/2018 No Yes I82.592 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 Date of Service: 04/06/2020 3:30 PM Medical Record Number: [id number] Patient Account Number: [id number] Date of Birth/Sex: December 18, 1945 (74 y.o. F) Treating RN: Cornell Barman Primary Care Provider: [name] Other Clinician: Referring Provider: [name] Treating Provider/Extender: Tito Dine in Treatment: 36 Subjective History of Present Illness (HPI) Pleasant 74 year old with history of chronic venous insufficiency. No diabetes or peripheral vascular disease. Left ABI 1.29. Questionable history of left lower extremity DVT. She developed a recurrent ulceration on her left lateral calf in December 2015, which she attributes to poor diet and subsequent lower extremity edema. She underwent endovenous laser ablation of her left greater saphenous vein in 2010. She underwent laser ablation of accessory branch of left GSV in April 2016 by Dr. Kellie Simmering at [facility]. She was previously wearing [place] boots, which she tolerated well. Tolerating 2 layer compression and cadexomer iodine. She returns to clinic for follow-up and is without new complaints. She denies any significant pain at this time. She reports persistent pain with pressure. No claudication or ischemic rest pain. No fever or chills. No drainage. READMISSION 11/13/16; this is a 74 year old woman who is not a diabetic. She is here for a review of a painful area on her left medial lower extremity. I note that she was seen here previously last year for wound I believe to be in the same area. At that time she had undergone previously a left greater saphenous vein ablation by Dr. Kellie Simmering and she had a ablation of the anterior accessory branch of the left greater saphenous vein in March 2016. Seeing that the wound actually closed over. In reviewing the history with her today the ulcer in this area has been recurrent. She describes a biopsy of this area in 2009 that only showed stasis  physiology. 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 Do not see that she has ever had arterial studies in the left leg. She tells me she has a follow-up with Dr. Kellie Simmering in roughly 10 days In any case she developed the reopening of this area roughly a month ago. On the background of this she describes rapidly increasing edema which has responded to Lasix 40 mg and metolazone 2.5 mg as well as the patient's lymph massage. She has been told she has both venous insufficiency and lymphedema but she cannot tolerate compression stockings 11/28/16; the patient saw Dr. Kellie Simmering recently. Per the patient he did arterial Dopplers in the office that did not show evidence of arterial insufficiency, per the patient he stated "treat this like an ordinary venous ulcer". She also saw her dermatologist Dr. Ronnald Ramp who felt that this was more of a vascular ulcer. In general things are improving although she arrives today with increasing bilateral lower extremity edema with weeping a deeper fluid through the wound on the left medial leg compatible with some degree of lymphedema 12/04/16; the patient's wound is fully epithelialized but I don't think fully healed. We will do another week of depression with Promogran and TCA however I suspect we'll be able to discharge her next week. This is a very unusual-looking wound which was initially a figure-of-eight type wound lying on its  side surrounded by petechial like hemorrhage. She has had venous ablation on this side. She apparently does not have an arterial issue per Dr. Kellie Simmering. She saw her dermatologist thought it was "vascular". Patient is definitely going to need ongoing compression and I talked about this with her today she will go to elastic therapy after  she leaves here next week 12/11/16; the patient's wound is not completely closed today. She has surrounding scar tissue and in further discussion with the patient it would appear that she had ulcers in this area in 2009 for a prolonged period of time ultimately requiring a punch biopsy of this area that only showed venous insufficiency. I did not previously pickup on this part of the history from the patient. 12/18/16; the patient's wound is completely epithelialized. There is no open area here. She has significant bilateral venous insufficiency with secondary lymphedema to a mild-to-moderate degree she does not have compression stockings.. She did not say anything to me when I was in the room, she told our intake nurse that she was still having pain in this area. This isn't unusual recurrent small open area. She is going to go to elastic therapy to obtain compression stockings. 12/25/16; the patient's wound is fully epithelialized. There is no open area here. The patient describes some continued episodic discomfort in this area medial left calf. However everything looks fine and healed here. She is been to elastic therapy and caught herself 15-20 mmHg stockings, they apparently were having trouble getting 20-30 mm stockings in her size 01/22/17; this is a patient we discharged from the clinic a month ago. She has a recurrent open wound on her medial left calf. She had 15 mm support stockings. I told her I thought she needed 20-30 mm compression stockings. She tells me that she has been ill with hospitalization secondary to asthma and is been found to have severe hypokalemia likely secondary to a combination of Lasix and metolazone. This morning she noted blistering and leaking fluid on the posterior part of her left leg. She called our intake nurse urgently and we was saw her this afternoon. She has not had any real discomfort here. I don't know that she's been wearing any stockings on this leg for at  least 2-3 days. ABIs in this clinic were 1.21 on the right and 1.3 on the left. She is previously seen vascular surgery who does not think that there is a peripheral arterial issue. 01/30/17; Patient arrives with no open wound on the left leg. She has been to elastic therapy and obtained 20-51mmhg below knee stockings and she has one on the right leg today. READMISSION 02/19/18; this Podolak is a now 74 year old patient we've had in this clinic perhaps 3 times before. I had last looked at her from January 07 December 2016 with an area on the medial left leg. We discharged her on 12/25/16 however she had to be readmitted on 01/22/17 with a recurrence. I have in my notes that we discharged her on 20-30 mm stockings although she tells me she was only wearing support hose because she cannot get stockings on predominantly related to her cervical spine surgery/issues. She has had previous ablations done by vein and vascular in [place] including a great saphenous vein ablation on the left with an anterior accessory branch ablation I think both of these were in 2016. On one of the previous visit she had a biopsy noted 2009 that was negative. She is not felt to have an arterial issue.  She is not a diabetic. She does have a history of obstructive sleep apnea hypertension asthma as well as chronic venous insufficiency and lymphedema. On this occasion she noted 2 dry scaly patch on her left leg. She tried to put lotion on this it didn't really help. There were 2 open areas.the patient has been seeing her primary physician from 02/05/18 through 02/14/18. She had Unna boots applied. The superior wound now on the lateral left leg has closed but she's had one wound that remains open on the lateral left leg. This is not the same spot as we dealt with in 2018. ABIs in this clinic were 1.3 bilaterally SAGAL, FARFAN ([id number]) 02/26/18; patient has a small wound on the left lateral calf. Dimensions are down. She has  chronic venous insufficiency and lymphedema. 03/05/18; small open area on the left lateral calf. Dimensions are down. Tightly adherent necrotic debris over the surface of the wound which was difficult to remove. Also the dressing [over collagen] stuck to the wound surface. This was removed with some difficulty as well. Change the primary dressing to Hydrofera Blue ready 03/12/18; small open area on the left lateral calf. Comes in with tightly adherent surface eschar as well as some adherent Hydrofera Blue. 03/19/18; open area on the left lateral calf. Again adherent surface eschar as well as some adherent Hydrofera Blue nonviable subcutaneous tissue. She complained of pain all week even with the reduction from 4-3 layer compression I put on last week. Also she had an increase in her ankle and calf measurements probably related to the same thing. 03/26/18; open area on the left lateral calf. A very small open area remains here. We used silver alginate starting last week as the Hydrofera Blue seem to stick to the wound bed. In using 4-layer compression 04/02/18; the open area in the left lateral calf at some adherent slough which I removed there is no open area here. We are able to transition her into her own compression stocking. Truthfully I think this is probably his support hose. However this does not maintain skin integrity will be limited. She cannot put over the toe compression stockings on because of neck problems hand problems etc. She is allergic to the lining layer of juxta lites. We might be forced to use extremitease stocking should this fail READMIT 11/24/2018 Patient is now a 74 year old woman who is not a diabetic. She has been in this clinic on at least 3 previous occasions largely with recurrent wounds on her left leg secondary to chronic venous insufficiency with secondary lymphedema. Her situation is complicated by inability to get stockings on and an allergy to neoprene which is  apparently a component and at least juxta lites and other stockings. As a result she really has not been wearing any stockings on her legs. She tells [place] that roughly 2 or 3 weeks ago she started noticing a stinging sensation just above her ankle on the left medial aspect. She has been diagnosed with pseudogout and she wondered whether this was what she was experiencing. She tried to dress this with something she bought at the store however subsequently it pulled skin off and now she has an open wound that is not improving. She has been using Vaseline gauze with a cover bandage. She saw her primary doctor last week who put an Haematologist on her. ABIs in this clinic was 1.03 on the left 2/12; the area is on the left medial ankle. Odd-looking wound with what looks to  be surface epithelialization but a multitude of small petechial openings. This clearly not closed yet. We have been using silver alginate under 3 layer compression with TCA 2/19; the wound area did not look quite as good this week. Necrotic debris over the majority of the wound surface which required debridement. She continues to have a multitude of what looked to be small petechial openings. She reminds [place] that she had a biopsy on this initially during her first outbreak in 2015 in [place] dermatology. She expresses concern about this being a possible melanoma. 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 I assured her that this does not look anything like melanoma. Except for the petechial reaction it does look like a venous insufficiency area and she certainly has evidence of this on both sides 2/26; a difficult area on the left medial ankle. The patient clearly has chronic venous hypertension with some degree of lymphedema. The odd thing about the area is the small petechial hemorrhages. I am not really sure how to explain this. This was present last  time and this is not a compression injury. We have been using Hydrofera Blue which I changed to last week 3/4; still using Hydrofera Blue. Aggressive debridement today. She does not have known arterial issues. She has seen Dr. Kellie Simmering at [facility] vein and vascular and and has an ablation on the left. [Anterior accessory branch of the greater saphenous]. From what I remember they did not feel she had an arterial issue. The patient has had this area biopsied in 2009 at [facility] dermatology and by her recollection they said this was "stasis". She is also follow-up with dermatology locally who thought that this was more of a vascular issue 3/11; using Hydrofera Blue. Aggressive debridement today. She does not have an arterial issue. We are using 3 layer compression although we may need to go to 4. The patient has been in for multiple changes to her wrap since I last saw her a week ago. She says that the area was leaking. I do not have too much more information on what was found 01/19/19 on evaluation today patient was actually being seen for a nurse visit when unfortunately she had the area on her left lateral lower extremity as well as weeping from the right lower extremity that became apparent. Therefore we did end up actually seeing her for a full visit with myself. She is having some pain at this site as well but fortunately nothing too significant at this point. No fevers, chills, nausea, or vomiting noted at this time. 3/18-Patient is back to the clinic with the left leg venous leg ulcer, the ulcer is larger in size, has a surface that is densely adherent with fibrinous tissue, the Hydrofera Blue was used but is densely adherent and there was difficulty in removing it. The right lower extremity was also wrapped for weeping edema. Patient has a new area over the left lateral foot above the malleolus that is small and appears to have no debris with intact surrounding skin. Patient is on increased  dose of Lasix also as a means to edema management 3/25; the patient has a nonhealing venous ulcer on the medial left leg and last week developed a smaller area on the lateral left calf. We have been using Hydrofera Blue with a contact layer. 4/1; no major change in these wounds areas. Left medial and more recently left lateral calf. I tried  Iodoflex last week to aid in debridement she did not tolerate this. She stated her pain was terrible all week. She took the top layer of the 4 layer compression off. 4/8; the patient actually looks somewhat better in terms of her more prominent left lateral calf wound. There is some healthy looking tissue here. She is still complaining of a lot of discomfort. 4/15; patient in a lot of pain secondary to sciatica. She is on a prednisone taper prescribed by her primary physician. She has the 2 areas one on the left medial and more recently a smaller area on the left lateral calf. Both of these just above the malleoli 4/22; her back pain is better but she still states she is very uncomfortable and now feels she is intolerant to the [employer]. No real change in the wounds we have been using Sorbact. She has been previously intolerant to Iodoflex. There is not a lot of option about what we can use to debride this wound under compression that she no doubt needs. sHe states Ultram no longer works for her pain 4/29; no major change in the wounds slightly increased depth. Surface on the original medial wound perhaps somewhat improved however the more recent area on the lateral left ankle is 100% covered in very adherent debris we have been using Sorbact. She tolerates 4 layer compression well and her edema control is a lot better. She has not had to come in for a nurse check 5/6; no major change in the condition of the wounds. She did consent to debridement today which was done with some difficulty. Continuing Sorbact. She did not tolerate Iodoflex. She was in for a check  of her compression the day after we wrapped her last week this was adjusted but nothing much was found 5/13; no major change in the condition or area of the wounds. I was able to get a fairly aggressive debridement done on the lateral left leg wound. Even using Sorbact under compression. She came back in on Friday to have the wrap changed. She says she felt uncomfortable on the lateral aspect of her ankle. She has a long history of chronic venous insufficiency including previous ablation surgery on this side. 5/20-Patient returns for wounds on left leg with both wounds covered in slough, with the lateral leg wound larger in size, she has been in 3 layer compression and felt more comfortable, she describes pain in ankle, in leg and pins and needles in foot, and is about to try Pamelor for this 6/3; wounds on the left lateral and left medial leg. The area medially which is the most recent of the 2 seems to have had the largest increase in Chrisman, GWENDOLYNN RAAK. ([id number]) dimensions. We have been using Sorbac to try and debride the surface. She has been to see orthopedics they apparently did a plain x-ray that was indeterminant. Diagnosed her with neuropathy and they have ordered an MRI to determine if there is underlying osteomyelitis. This was not high on my thought list but I suppose it is prudent. We have advised her to make an appointment with vein and vascular in [place]. She has a history of a left greater saphenous and accessory vein ablations I wonder if there is anything else that can be done from a surgical point of view to help in these difficult refractory wounds. We have previously healed this wound on one occasion but it keeps on reopening [medial side] 6/10; deep tissue culture I did last week I  think on the left medial wound showed both moderate E. coli and moderate staph aureus [MSSA]. She is going to require antibiotics and I have chosen Augmentin. We have been using Sorbact and we have  made better looking wound surface on both sides but certainly no improvement in wound area. She was back in last Friday apparently for a dressing changes the wrap was hurting her outer left ankle. She has not managed to get a hold of vein and vascular in [place]. We are going to have to make her that appointment 6/17; patient is tolerating the Augmentin. She had an MRI that I think was ordered by orthopedic surgeon this did not show osteomyelitis or an abscess did suggest cellulitis. We have been using Sorbact to the lateral and medial ankles. We have been trying to arrange a follow-up appointment with vein and vascular in [place] or did her original ablations. We apparently an area sent the request to vein and vascular in [facility] 6/24; patient has completed the Augmentin. We do not yet have a vein and vascular appointment in [place]. I am not sure what the issue is here we have asked her to call tomorrow. We are using Sorbact. Making some improvements and especially the medial wound. Both surfaces however look better medial and lateral. 7/1; the patient has been in contact with vein and vascular in [place] but has not yet received an appointment. Using Sorbact we have gradually improve the wound surface with no improvement in surface area. She is approved for Apligraf but the wound surface still is not completely viable. She has not had to come in for a dressing change 7/8; the patient has an appointment with vein and vascular on 7/31 which is a Friday afternoon. She is concerned about getting back here for [place] to dress her wounds. I think it is important to have them goal for her venous reflux/history of ablations etc. to see if anything else can be done. She apparently tested positive for 1 of the blood tests with regards to lupus and saw a rheumatologist. He has raised the issue of vasculitis again. I have had this thought in the past however the evidence seems overwhelming that  this is a venous reflux etiology. If the rheumatologist tells me there is clinical and laboratory investigation is positive for lupus I will rethink this. 7/15; the patient's wound surfaces are quite a bit better. The medial area which was her original wound now has no depth although the lateral wound which was the more recent area actually appears larger. Both with viable surfaces which is indeed better. Using Sorbact. I wanted to use Apligraf on her however there is the issue of the vein and vascular appointment on 7/31 at 2:00 in the afternoon which would not allow her to get back to be rewrapped and they would no doubt remove the graft 7/22; the patient's wound surfaces have moderate amount of debris although generally look better. The lateral one is larger with 2 small satellite areas superiorly. We are waiting for her vein and vascular appointment on 7/31. She has been approved for Apligraf which I would like to use after th 7/29; wound surfaces have improved no debridement is required we have been using Sorbact. She sees vein and vascular on Friday with this so question of whether anything can be done to lessen the likelihood of recurrence and/or speed the healing of these areas. She is already had previous ablations. She no doubt has severe venous hypertension 8/5-Patient returns at  1 week, she was in [employer] for 3 days by her podiatrist, we have been using so backed to the wound, she has increased pain in both the wounds on the left lower leg especially the more distal one on the lateral aspect 8/12-Patient returns at 1 week and she is agreeable to having debridement in both wounds on her left leg today. We have been using Sorbact, and vascular studies were reviewed at last visit 8/19; the patient arrives with her wounds fairly clean and no debridement is required. We have used Sorbact which is really done a nice job in cleaning up these very difficult wound surfaces. The patient saw Dr.  Donzetta Matters of vascular surgery on 7/31. He did not feel that there was an arterial component. He felt that her treated greater saphenous vein is adequately addressed and that the small saphenous vein did not appear to be involved significantly. She was also noted to have deep venous reflux which is not treatable. Dr. Donzetta Matters mentioned the possibility of a central obstructive component leading to reflux and he offered her central venography. She wanted to discuss this or think about it. I have urged her to go ahead with this. She has had recurrent difficult wounds in these areas which do heal but after months in the clinic. If there is anything that can be done to reduce the likelihood of this I think it is worth it. 9/2 she is still working towards getting follow-up with Dr. Donzetta Matters to schedule her CT. Things are quite a bit worse venography. I put Apligraf on 2 weeks ago on both wounds on the medial and lateral part of her left lower leg. She arrives in clinic today with 3 superficial additional wounds above the area laterally and one below the wound medially. She describes a lot of discomfort. I think these are probably wrapped injuries. Does not look like she has cellulitis. 07/20/2019 on evaluation today patient appears to be doing somewhat poorly in regard to her lower extremity ulcers. She in fact showed signs of erythema in fact we may even be dealing with an infection at this time. Unfortunately I am unsure if this is just infection or if indeed there may be some allergic reaction that occurred as a result of the Apligraf application. With that being said that would be unusual but nonetheless not impossible in this patient is one who is unfortunately allergic to quite a bit. Currently we have been using the Sorbact which seems to do as well as anything for her. I do think we may want to obtain a culture today to see if there is anything showing up there that may need to be addressed. 9/16; noted that last  week the wounds look worse in 1 week follow-up of the Apligraf. Using Sorbact as of 2 days ago. She arrives with copious amounts of drainage and new skin breakdown on the back of the left calf. The wounds arm more substantial bilaterally. There is a fair amount of swelling in the left calf no overt DVT there is edema present I think in the left greater than right thigh. She is supposed to go on 9/28 for CT venography. The wounds on the medial and lateral calf are worse and she has new skin breakdown posteriorly at least new for me. This is almost developing into a circumferential wound area The Apligraf was taken off last week which I agree with things are not going in the right direction a culture was done we do  not have that back yet. She is on Augmentin that she started 2 days ago 9/23; dressing was changed by her nurses on Monday. In general there is no improvement in the wound areas although the area looks less angry than last week. She did get Augmentin for MSSA cultured on the 14th. She still appears to have too much swelling in the left leg even with 3 layer compression 9/30; the patient underwent her procedure on 9/28 by Dr. Donzetta Matters at vascular and vein specialist. She was discovered to have the common iliac vein measuring 12.2 mm but at the level of L4-L5 measured 3 mm. After stenting it measured 10 mm. 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 Rouleaux flow in the common femoral and femoral vein was observed much improved after stenting. We are using silver alginate to the wounds on the medial and lateral ankle on the left. 4 layer compression 10/7; the patient had fluid swelling around her knee and 4 layer compression. At the advice of vein and vascular this was reduced to 3 layer which she is tolerating better. We have been using silver alginate under 3 layer compression since last Friday 10/14; arrives with the areas on the left ankle looking a lot better. Inflammation in  the area also a lot better. She came in for a nurse check on 10/9 10/21; continued nice improvement. Slight improvements in surface area of both the medial and lateral wounds on the left. A lot of the satellite lesions in the weeping erythema around these from stasis dermatitis is resolved. We have been using silver alginate JOELE, ZENO ([id number]) 10/28; general improvement in the entire wound areas although not a lot of change in dimensions the wound certainly looks better. There is a lot less in terms of venous inflammation. Continue silver alginate this week however look towards Hydrofera Blue next week 11/4; very adherent debris on the medial wound left wound is not as bad. We have been using silver alginate. Change to [facility] today 11/11; very adherent debris on both wound areas. She went to vein and vascular last week and follow-up they put in [place] boot on this today. He says the [facility] was adherent. Wound is definitely not as good as last week. Especially on the left there the satellite lesions look more prominent 11/18; absolutely no better. erythema on lateral aspect with tenderness. 09/30/2019 on evaluation today patient appears to actually be doing better. Dr. Dellia Nims did put her on doxycycline last week which I do believe has helped her at this point. Fortunately there is no signs of active infection at this time. No fevers, chills, nausea, vomiting, or diarrhea. I do believe he may want extend the doxycycline for 7 additional days just to ensure everything does completely cleared up the patient is in agreement with that plan. Otherwise she is going require some sharp debridement today 12/2; patient is completing a 2-week course of doxycycline. I gave her this empirically for inflammation as well as infection when I last saw her 2 weeks ago. All of this seems to be better. She is using silver alginate she has the area on the medial aspect of the larger area laterally  and the 2 small satellite regions laterally above the major wound. 12/9; the patient's wound on the left medial and left lateral calf look really quite good. We have been using silver alginate. She saw vein and vascular in follow-up on 10/09/2019. She has had a previous left greater saphenous  vein ablation by Dr. Oscar La in 2016. More recently she underwent a left common iliac vein stent by Dr. 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 The swelling is improved and certainly the wounds have improved. The patient shows [place] today area on the right medial calf there is almost no wound but leaking lymphedema. She says she start this started 3 or 4 days ago. She did not traumatize it. It is not painful. She does not wear compression on that side 12/16; the patient continues to do well laterally. Medially still requiring debridement. The area on the right calf did not materialize to anything and is not currently open. We wrapped this last time. She has support stockings for that leg although I am not sure they are going to provide adequate compression 12/23; the lateral wound looks stable. Medially still requiring debridement for tightly adherent fibrinous debris. We've been using silver alginate. Surface area not any different 12/30; neither wound is any better with regards to surface and the area on the left lateral is larger. I been using silver alginate to the left lateral which look quite good last week and Sorbact to the left medial 11/11/2019. Lateral wound area actually looks better and somewhat smaller. Medial still requires a very aggressive debridement today. We have been using Sorbact on both wound areas 1/13; not much better still adherent debris bilaterally. I been using Sorbact. She has severe venous hypertension. Probably some degree of dermal fibrosis distally. I wonder whether tighter compression might help and I am going to try that today. We also need to work on  the bioburden 1/20; using Sorbact. She has severe venous hypertension status post stent placement for pelvic vein compression. We applied gentamicin last time to see if we could reduce bioburden I had some discussion with her today about the use of pentoxifylline. This is occasionally used in this setting for wounds with refractory venous insufficiency. However this interacts with Plavix. She tells me that she was put on this after stent placement for 3 months. She will call Dr. Claretha Cooper office to discuss 1/27; we are using gentamicin under Sorbact. 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 She has a stent. Wound medially is measuring smaller this week. Laterally measuring slightly larger although she has some satellite lesions superiorly 2/3; gentamicin under Sorbact under 4-layer compression. 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 She has a stent on Plavix. Her wounds are measuring smaller this week. More substantially laterally where there is a satellite lesion superiorly. 2/10; gentamicin under Sorbac. 4-layer compression. Patient communicated with Dr. Donzetta Matters at [facility] in [place]. He is okay with the patient coming off Plavix I will therefore start her on pentoxifylline for a 1 month trial. In general her wounds look better today. I had some concerns about swelling in the left thigh however she measures 61.5 on the right and 63 on the mid thigh which does not suggest there is any difficulty. The patient is not describing any pain. 2/17; gentamicin under Sorbac 4-layer compression. She has been on pentoxifylline for 1 week and complains of loose stool. No nausea she is eating and drinking well 2/24; the patient apparently came in 2 days ago for a nurse visit when her wrap fell down. Both areas look a little worse this week macerated medially and satellite lesions laterally. Change to silver alginate today 3/3; wounds are larger  today especially medially. She also has more swelling in her foot lower leg  and I even noted some swelling in her posterior thigh which is tender. I wonder about the patency of her stent. Fortuitously she sees Dr. Claretha Cooper group on Friday 3/10; Mrs. Schnautz was seen by [facility] vascular on 3/5. The patient underwent ultrasound. There was no evidence of thrombosis involving the IVC no evidence of thrombosis involving the right common iliac vein there is no evidence of thrombosis involving the right external iliac vein the left external vein is also patent. The right common iliac vein stent appears patent bilateral common femoral veins are compressible and appear patent. I was concerned about the left common iliac stent however it looks like this is functional. She has some edema in the posterior thigh that was tender she still has that this week. I also note they had trouble finding the pulses in her left foot and booked her for an ABI baseline in 4 weeks. She will follow up in 6 months for repeat IVC duplex. The patient stopped the pentoxifylline because of diarrhea. It does not look like that was being effective in any case. I have advised her to go back on her aspirin 81 mg tablet, vascular it also suggested this 3/17; comes in today with her wound surfaces a lot better. The excoriations from last week considerably better probably secondary to the TCA. We have been using silver alginate 3/24; comes in today with smaller wounds both medially and laterally. Both required debridement. There are 2 small satellite areas superiorly laterally. She also has a very odd bandlike area in the mid calf almost looking like there was a weakness in the wrap in a localized area. I would write this off as being this however anteriorly she has a small raised ballotable area that is very tender almost reminiscent of an abscess but there was no obvious purulent surface to it. 02/04/20 upon evaluation today patient appears  to be doing fairly well in regard to her wounds today. Fortunately there is no signs of active infection at this time. No fevers, chills, nausea, vomiting, or diarrhea. She has been tolerating the dressing changes without complication. Fortunately I feel like she is showing signs of improvement although has been sometime since have seen her. Nonetheless the area of concern that Dr. Dellia Nims had last week where she had possibly an area of the wrap that was we can allow the leg to bulge appears to be doing significantly better today there is no signs of anything worsening. MARWA, [name] ([id number]) 4/7; the patient's wounds on her medial and lateral left leg continue to contract. We have been using a regular alginate. Last week she developed an area on the right medial lower leg which is probably a venous ulcer as well. 4/14; the wounds on her left medial and lateral lower leg continue to contract. Surface eschar. We have been using regular alginate. The area on the right medial lower leg is closed. We have been putting both legs under 4-layer contraction. The patient went back to see vein and vascular she had arterial studies done which were apparently "quite good" per the patient although I have not read their notes I have never felt she had an arterial issue. 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 This is been longstanding and refractory to exercise, leg elevation and longstanding use of compression wraps in our clinic as well as compression stockings on the times we have been able to get these to heal 4/21; we thought she actually might be  close this week however she arrives in clinic with a lot of edema in her upper left calf and into her posterior thigh. This is been an intermittent problem here. She says the wrap fell down but it was replaced with a nurse visit on Monday. We are using calcium alginate to the wounds and the wound sizes there not  terribly larger than last week but there is a lot more edema 4/28; again wound edges are smaller on both sides. Her edema is better controlled than last time. She is obtained her compression pumps from [employer] although they have not been to her home to set these up. 5/5; left medial and left lateral both look stable. I am not sure the medial is any smaller. We have been using calcium alginate under 4-layer compression. She had an area on the right medial. This was eschared today. We have been wrapping this as well. She does not tolerate external compression stockings due to a history of various contact allergies. She has her compression pumps however the representative from the company is coming on her to show her how to use these tomorrow 5/19; patient with severe chronic venous insufficiency secondary to central venous disease. She had a stent placed in her left common iliac vein. She has done better since but still difficult to control wounds. She comes in today with nothing open on the right leg. Her areas on the left medial and left lateral are just about closed. We are using calcium alginate under 4-layer compression. She is using her external compression pumps at home She only has 15-20 support stockings. States she cannot get anything tighter than that on. 03/30/20-Patient returns at 1 week, the wounds on the left leg are both slightly bigger, the last week she was on 3 layer compression which started to slide down. She is starting to use her lymphedema pumps although she stated on 1 day her right ankle started to swell up and she have to stop that day. Unfortunately the open area seem to oscillate between improving to the point of healing and then flaring up all to do with effectiveness of compression or lack of due to the left leg topography not keeping the compression wraps from rolling down 6/2 patient comes in with a 15/20 mmHg stocking on the right leg. She tells me that she  developed a lot of swelling in her ankles she saw orthopedics she was felt to possibly be having a flare of pseudogout versus some other type of arthritis. She was put on steroids for a respiratory issue so that helps with the inflammation. She has not been using the pumps all week. She thinks the left thigh is more swollen than usual and I would agree with that. She has an appointment with Dr. Donzetta Matters 9 days or so from now Objective Constitutional Vitals Time Taken: 3:42 PM, Height: 63 in, Weight: 224.7 lbs, BMI: 39.8, Temperature: 98.2 F, Pulse: 80 bpm, Respiratory Rate: 18 breaths/min, Blood Pressure: 161/73 mmHg. Cardiovascular Edema present in both extremities. I would agree that the left thigh seems more swollen although there is no pitting edema.. General Notes: Wound exam; left medial and left lateral wounds look quite good. There is smaller with a clean base that does not require debridement. No evidence of surrounding infection Integumentary (Hair, Skin) No evidence of cellulitis. Wound #5 status is Open. Original cause of wound was Gradually Appeared. The wound is located on the Left,Medial Lower Leg. The wound measures 1.5cm length x 1.3cm  width x 0.1cm depth; 1.532cm^2 area and 0.153cm^3 volume. There is Fat Layer (Subcutaneous Tissue) Exposed exposed. There is no tunneling or undermining noted. There is a medium amount of serous drainage noted. The wound margin is flat and intact. There is large (67-100%) pink granulation within the wound bed. There is a small (1-33%) amount of necrotic tissue within the wound bed including Adherent Slough. Wound #6 status is Open. Original cause of wound was Gradually Appeared. The wound is located on the Left,Lateral Lower Leg. The wound measures 1.5cm length x 1.6cm width x 0.1cm depth; 1.885cm^2 area and 0.188cm^3 volume. There is Fat Layer (Subcutaneous Tissue) Exposed exposed. There is no tunneling or undermining noted. There is a medium  amount of serous drainage noted. The wound margin is flat and intact. There is large (67-100%) pink granulation within the wound bed. There is a small (1-33%) amount of necrotic tissue within the wound bed including Eschar and Adherent Slough. 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 There was a Four Layer Compression Therapy Procedure with a pre-treatment ABI of 1 by Cornell Barman, RN. Post procedure Diagnosis Wound #5: Same as Pre-Procedure Plan Anesthetic (add to Medication List): Wound #5 Left,Medial Lower Leg: Topical Lidocaine 4% cream applied to wound bed prior to debridement (In Clinic Only). Wound #6 Left,Lateral Lower Leg: Topical Lidocaine 4% cream applied to wound bed prior to debridement (In Clinic Only). Skin Barriers/Peri-Wound Care: Wound #5 Left,Medial Lower Leg: Triamcinolone Acetonide Ointment (TCA) - peri-wound Wound #6 Left,Lateral Lower Leg: Triamcinolone Acetonide Ointment (TCA) - peri-wound Primary Wound Dressing: Wound #5 Left,Medial Lower Leg: Alginate Wound #6 Left,Lateral Lower Leg: Alginate Secondary Dressing: Wound #5 Left,Medial Lower Leg: ABD pad Wound #6 Left,Lateral Lower Leg: ABD pad Dressing Change Frequency: Wound #5 Left,Medial Lower Leg: Change dressing every week Other: - Nurse visit Friday and Monday if needed. Wound #6 Left,Lateral Lower Leg: Change dressing every week Other: - Nurse visit Friday and Monday if needed. Follow-up Appointments: Wound #5  Left,Medial Lower Leg: Return Appointment in 1 week. Nurse Visit as needed Wound #6 Left,Lateral Lower Leg: Return Appointment in 1 week. Nurse Visit as needed Edema Control: Wound #5 Left,Medial Lower Leg: 3 Layer Compression System - Left Lower Extremity Patient to wear own compression stockings - On Right Compression Pump: Use compression pump on left lower extremity for 60 minutes, twice daily. Compression Pump: Use compression pump on right lower extremity for 60 minutes, twice daily. Wound #6 Left,Lateral Lower Leg: 3 Layer Compression System - Left Lower Extremity Patient to wear own compression stockings - On Right Compression Pump: Use compression pump on left lower extremity for 60 minutes, twice daily. Compression Pump: Use compression pump on right lower extremity for 60 minutes, twice daily. DEZYRE, HAGIN ([id number]) #1 I continued with the calcium alginate to the wounds on the left leg which are small and clean 2. She complains of pain when using the pump on the anterior part of her foot and other positions that do not have a clear wound care explanation. She thinks it is possibly sciatica or neuropathy. On the right  side she might of had an acute arthritis attack in the right ankle. I cautioned her about the decisions not to use the pumps. If we do not control the swelling in her legs her skin is going to breakdown. 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 This will not accommodate a lot of swelling. 3. I agree with seeing Dr. Donzetta Matters predominantly about the patency of her iliac vein stent as well as if there is any other issues in the veins in the thighs contributing to the swelling Electronic Signature(s) Signed: 04/06/2020 4:41:32 PM By: Linton Ham MD Entered By: Linton Ham on 04/06/2020 16:37:38 Sans, [name] -------------------------------------------------------------------------------- SuperBill Details Patient  Name: FIDELIA, HROMADKA. Date of Service: 04/06/2020 Medical Record Number: [id number] Patient Account Number: [id number] Date of Birth/Sex: 1946/07/02 (74 y.o. 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 Facility Procedures CPT4 Code: [id number] Description: (Facility Use Only) [id number] - [place] LWR LT LEG Modifier: Quantity: 1 Physician Procedures CPT4 Code Description: [id number] - WC PHYS LEVEL 3 - EST PT Modifier: Quantity: 1 CPT4 Code Description: ICD-10 Diagnosis Description L97.221 Non-pressure chronic ulcer of left calf limited to breakdown of skin I87.332 Chronic venous hypertension (idiopathic) with ulcer and inflammation of I89.0 Lymphedema, not elsewhere classified Modifier: left lower extremity Quantity: Electronic Signature(s) Signed: 04/06/2020 4:41:32 PM By: Linton Ham MD Entered By: Linton Ham on 04/06/2020 16:38:15

## 2020-04-07 NOTE — Progress Notes (Addendum)
This patient returns to my office for at risk foot care.  This patient requires this care by a professional since this patient will be at risk due to having chronic venous insufficiency.  Patient is wearing an unna boot and is under care from the wound center right leg.  Wearing compression sock right leg..  Patient is taking trental.This patient is unable to cut nails themselves since the patient cannot reach their nails.These nails are painful walking and wearing shoes.  This patient presents for at risk foot care today.  General Appearance  Alert, conversant and in no acute stress.  Vascular  Deferred  Neurologic  Deferred  Nails Thick disfigured discolored nails with subungual debris  from hallux to fifth toes bilaterally. No evidence of bacterial infection or drainage bilaterally. Pincer hallux nails  B/L.  Orthopedic  No limitations of motion  feet .  No crepitus or effusions noted.  No bony pathology or digital deformities noted. DJD [name]  B/L.  Skin  normotropic skin with no porokeratosis noted bilaterally.  No signs of infections or ulcers noted.     Onychomycosis  Pain in right toes  Pain in left toes  Consent was obtained for treatment procedures.   Mechanical debridement of nails 1-5  bilaterally performed with a nail nipper.  Filed with dremel without incident. No infection or ulcer.     Return office visit   10  weeks        Told patient to return for periodic foot care and evaluation due to potential at risk complications.   Gardiner Barefoot DPM

## 2020-04-07 NOTE — Progress Notes (Signed)
[name], [name] Visit Report for 04/06/2020 Arrival Information Details Patient Name: [name], [name]. Date of Service: 04/06/2020 3:30 PM Medical Record Number: [id number] Patient Account Number: [id number] Date of Birth/Sex: 1946/08/23 (74 y.o. F) Treating RN: [name] Primary Care [name]: [name] Other Clinician: Referring [name]: [name] Treating Everlene Cunning/Extender: [name] in Treatment: 38 Visit Information History Since Last Visit Added or deleted any medications: Yes Patient Arrived: Ambulatory Any new allergies or adverse reactions: No Arrival Time: 15:42 Had a fall or experienced change in No Accompanied By: self activities of daily living that may affect Transfer Assistance: None risk of falls: Patient Identification Verified: Yes Signs or symptoms of abuse/neglect since last visito No Secondary Verification Process Completed: Yes Hospitalized since last visit: No Patient Requires Transmission-Based No Implantable device outside of the clinic excluding No Precautions: cellular tissue based products placed in the center Patient Has Alerts: Yes since last visit: Patient Alerts: Patient on Blood Has Dressing in Place as Prescribed: Yes Thinner Has Compression in Place as Prescribed: Yes aspirin 81 Pain Present Now: No Electronic Signature(s) Signed: 04/06/2020 4:11:28 PM By: [name] RCP, RRT, CHT Entered By: [name] on 04/06/2020 15:42:54 [name] -------------------------------------------------------------------------------- Compression Therapy Details Patient Name: [name], [name]. Date of Service: 04/06/2020 3:30 PM Medical Record Number: [id number] Patient Account Number: [id number] Date of Birth/Sex: 1946-01-24 (74 y.o. F) Treating RN: [name] Primary Care [name]: [name] Other Clinician: Referring [name]: [name] Treating  [name]/Extender: [name] in Treatment: 69 Compression Therapy Performed for Wound Assessment: Wound #5 Left,Medial Lower Leg Performed By: Clinician Amber Barman, RN Compression Type: Four Layer Pre Treatment ABI: 1 Post Procedure Diagnosis Same as Pre-procedure Electronic Signature(s) Signed: 04/07/2020 1:40:27 PM By: [name], [name], RN, CWS, [name], [name] Entered By: [name], [name], RN, CWS, Kim on 04/06/2020 16:18:49 [name] -------------------------------------------------------------------------------- Encounter Discharge Information Details Patient Name: [name]. Date of Service: 04/06/2020 3:30 PM Medical Record Number: [id number] Patient Account Number: [id number] Date of Birth/Sex: 1946-03-02 (74 y.o. F) Treating RN: [name] Primary Care [name]: [name] Other Clinician: Referring [name]: [name] Treating [name]/Extender: [name] in Treatment: 68 Encounter Discharge Information Items Discharge Condition: Stable Ambulatory Status: Ambulatory Discharge Destination: Home Transportation: Private Auto Accompanied By: self Schedule Follow-up Appointment: Yes Clinical Summary of Care: Electronic Signature(s) Signed: 04/07/2020 1:40:27 PM By: [name], [name], RN, CWS, [name], [name] Entered By: [name], [name], RN, CWS, Kim on 04/06/2020 16:20:21 [name] -------------------------------------------------------------------------------- Lower Extremity Assessment Details Patient Name: [name], [name]. Date of Service: 04/06/2020 3:30 PM Medical Record Number: [id number] Patient Account Number: [id number] Date of Birth/Sex: 15-Aug-1946 (74 y.o. F) Treating RN: [name] Primary Care Gevorg Brum: [name] Other Clinician: Referring [name]: [name] Treating [name]/Extender: [name] in Treatment: 69 Edema Assessment Assessed: [Left: No] [Right: No] Edema: [Left:  Ye] [Right: s] Calf Left: Right: Point of Measurement: 33 cm From Medial Instep 46 cm cm Ankle Left: Right: Point of Measurement: 10 cm From Medial Instep 23 cm cm Vascular Assessment Pulses: Dorsalis Pedis Palpable: [Left:Yes] Electronic Signature(s) Signed: 04/06/2020 4:33:52 PM By: [name] Entered By: [name] on 04/06/2020 15:49:36 [name] -------------------------------------------------------------------------------- Multi Wound Chart Details Patient Name: [name], [name]. Date of Service: 04/06/2020 3:30 PM Medical Record Number: [id number] Patient Account Number: [id number] Date of Birth/Sex: 11/27/1945 (74 y.o. F) Treating RN: [name] Primary Care Felissa Blouch: [name] Other  Clinician: Referring Darian Cansler: [name] Treating [name]/Extender: [name] in Treatment: 5 Vital Signs Height(in): 24 Pulse(bpm): 90 Weight(lbs): 224.7 Blood Pressure(mmHg): 161/73 Body Mass Index(BMI): 40 Temperature(F): 98.2 Respiratory Rate(breaths/min): 18 Photos: [N/A:N/A] Wound Location: Left, Medial Lower Leg Left, Lateral Lower Leg N/A Wounding Event: Gradually Appeared Gradually Appeared N/A Primary Etiology: Lymphedema Venous Leg Ulcer N/A Comorbid History: Cataracts, Asthma, Sleep Apnea, Cataracts, Asthma, Sleep Apnea, N/A Deep Vein Thrombosis, Deep Vein Thrombosis, Hypertension, Peripheral Venous Hypertension, Peripheral Venous Disease, Osteoarthritis, Received Disease, Osteoarthritis, Received Chemotherapy, Received Radiation Chemotherapy, Received Radiation Date Acquired: 11/19/2018 01/19/2019 N/A Weeks of Treatment: 69 63 N/A Wound Status: Open Open N/A Measurements L x W x D (cm) 1.5x1.3x0.1 1.5x1.6x0.1 N/A Area (cm) : 1.532 1.885 N/A Volume (cm) : 0.153 0.188 N/A % Reduction in Area: 82.10% -756.80% N/A % Reduction in Volume: 82.10% -754.50% N/A Classification: Full Thickness Without Exposed Full Thickness  Without Exposed N/A Support Structures Support Structures Exudate Amount: Medium Medium N/A Exudate Type: Serous Serous N/A Exudate Color: amber amber N/A Wound Margin: Flat and Intact Flat and Intact N/A Granulation Amount: Large (67-100%) Large (67-100%) N/A Granulation Quality: Pink Pink N/A Necrotic Amount: Small (1-33%) Small (1-33%) N/A Necrotic Tissue: Adherent Momeyer, Adherent Slough N/A Exposed Structures: Fat Layer (Subcutaneous Tissue) Fat Layer (Subcutaneous Tissue) N/A Exposed: Yes Exposed: Yes Fascia: No Fascia: No Tendon: No Tendon: No Muscle: No Muscle: No Joint: No Joint: No Bone: No Bone: No Epithelialization: None Small (1-33%) N/A Procedures Performed: Compression Therapy N/A N/A Treatment Notes Wound #5 (Left, Medial Lower Leg) Notes SARRAH, MURNAN. ([id number]) Left: alginate, ABD, 4 layer TCA, Right compression sock Wound #6 (Left, Lateral Lower Leg) Notes Left: alginate, ABD, 4 layer TCA, Right compression sock Electronic Signature(s) Signed: 04/06/2020 4:41:32 PM By: Linton Ham MD Entered By: Linton Ham on 04/06/2020 16:33:09 Herard, Tenna [name] ([id number]) -------------------------------------------------------------------------------- Multi-Disciplinary Care Plan Details Patient Name: [name], [name]. Date of Service: 04/06/2020 3:30 PM Medical Record Number: [id number] Patient Account Number: [id number] Date of Birth/Sex: August 30, 1946 (73 y.o. F) Treating RN: [name] Primary Care Aymen [name]: [name] Other Clinician: Referring Tallula Grindle: [name] Treating Liseth Wann/Extender: [name] in Treatment: 33 Active Inactive Medication Nursing Diagnoses: Knowledge deficit related to medication safety: actual or potential Goals: Patient/caregiver will demonstrate understanding of new oral/IV medications prescribed at the [facility] (topical prescriptions are covered under the skin breakdown problem) Date Initiated:  12/16/2019 Target Resolution Date: 01/13/2020 Goal Status: Active Interventions: Assess for medication contraindications each visit where new medications are prescribed Treatment Activities: New medication prescribed at [facility] : 12/16/2019 Notes: Soft Tissue Infection Nursing Diagnoses: Impaired tissue integrity Goals: Patient's soft tissue infection will resolve Date Initiated: 12/10/2018 Target Resolution Date: 01/09/2019 Goal Status: Active Interventions: Assess signs and symptoms of infection every visit Notes: Venous Leg Ulcer Nursing Diagnoses: Actual venous Insuffiency (use after diagnosis is confirmed) Goals: Patient will maintain optimal edema control Date Initiated: 12/10/2018 Target Resolution Date: 01/09/2019 Goal Status: Active Interventions: Assess peripheral edema status every visit. Treatment Activities: Therapeutic compression applied : 12/10/2018 Notes: Wound/Skin Impairment Nursing Diagnoses: [name], [name] Impaired tissue integrity Goals: Patient/caregiver will verbalize understanding of skin care regimen Date Initiated: 12/10/2018 Target Resolution Date: 01/09/2019 Goal Status: Active Interventions: Assess ulceration(s) every visit Treatment Activities: Topical wound management initiated : 12/10/2018 Notes: Electronic Signature(s) Signed: 04/07/2020 1:40:27 PM By: [name], [name], RN, CWS, [name], [name] Entered By: [name], [name], RN, CWS, Kim on 04/06/2020 16:13:38 Corbello, Tenna [name] ([id number]) -------------------------------------------------------------------------------- Pain Assessment Details  Patient Name: [name], [name]. Date of Service: 04/06/2020 3:30 PM Medical Record Number: [id number] Patient Account Number: [id number] Date of Birth/Sex: Apr 04, 1946 (74 y.o. F) Treating RN: [name] Primary Care Demarrius Guerrero: [name] Other Clinician: Referring Izzy Courville: [name] Treating Samia Kukla/Extender: [name] in  Treatment: 69 Active Problems Location of Pain Severity and Description of Pain Patient Has Paino Yes Site Locations Pain Location: Pain in Ulcers With Dressing Change: Yes Duration of the Pain. Constant / Intermittento Constant Pain Management and Medication Current Pain Management: Electronic Signature(s) Signed: 04/06/2020 4:33:52 PM By: [name] Entered By: [name] on 04/06/2020 15:47:49 [name], Tenna [name] ([id number]) -------------------------------------------------------------------------------- Patient/Caregiver Education Details Patient Name: [name], [name]. Date of Service: 04/06/2020 3:30 PM Medical Record Number: [id number] Patient Account Number: [id number] Date of Birth/Gender: [birth year] (74 y.o. F) Treating RN: [name] Primary Care Physician: [name] Other Clinician: Referring Physician: Ria [name] Treating Physician/Extender: [name] in Treatment: 53 Education Assessment Education Provided To: Patient Education Topics Provided Wound/Skin Impairment: Handouts: Caring for Your Ulcer Methods: Demonstration, Explain/Verbal Responses: State content correctly Electronic Signature(s) Signed: 04/07/2020 1:40:27 PM By: [name], [name], RN, CWS, [name], [name] Entered By: [name], [name], RN, CWS, Kim on 04/06/2020 16:19:19 [name] -------------------------------------------------------------------------------- Wound Assessment Details Patient Name: [name], [name]. Date of Service: 04/06/2020 3:30 PM Medical Record Number: [id number] Patient Account Number: [id number] Date of Birth/Sex: December 24, 1945 (74 y.o. F) Treating RN: [name] Primary Care Mysty Kielty: [name] Other Clinician: Referring Krishana Lutze: [name] Treating Lakeysha Slutsky/Extender: [name] in Treatment: 90 Wound Status Wound Number: 5 Primary Lymphedema Etiology: Wound Location: Left, Medial Lower Leg Wound Open Wounding  Event: Gradually Appeared Status: Date Acquired: 11/19/2018 Comorbid Cataracts, Asthma, Sleep Apnea, Deep Vein Thrombosis, Weeks Of Treatment: 69 History: Hypertension, Peripheral Venous Disease, Osteoarthritis, Clustered Wound: No Received Chemotherapy, Received Radiation Photos Wound Measurements Length: (cm) 1.5 Width: (cm) 1.3 Depth: (cm) 0.1 Area: (cm) 1.532 Volume: (cm) 0.153 % Reduction in Area: 82.1% % Reduction in Volume: 82.1% Epithelialization: None Tunneling: No Undermining: No Wound Description Classification: Full Thickness Without Exposed Support Structures Wound Margin: Flat and Intact Exudate Amount: Medium Exudate Type: Serous Exudate Color: amber Foul Odor After Cleansing: No Slough/Fibrino Yes Wound Bed Granulation Amount: Large (67-100%) Exposed Structure Granulation Quality: Pink Fascia Exposed: No Necrotic Amount: Small (1-33%) Fat Layer (Subcutaneous Tissue) Exposed: Yes Necrotic Quality: Adherent Slough Tendon Exposed: No Muscle Exposed: No Joint Exposed: No Bone Exposed: No Treatment Notes Wound #5 (Left, Medial Lower Leg) Notes Left: alginate, ABD, 4 layer TCA, Right compression sock Electronic Signature(s) [name], [name] Signed: 04/06/2020 4:33:52 PM By: [name] Entered By: [name] on 04/06/2020 15:55:07 Kiernan, Tenna [name] ([id number]) -------------------------------------------------------------------------------- Wound Assessment Details Patient Name: MARCELINA, TOLL. Date of Service: 04/06/2020 3:30 PM Medical Record Number: [id number] Patient Account Number: [id number] Date of Birth/Sex: 12-Sep-1946 (74 y.o. F) Treating RN: [name] Primary Care Thaddius Manes: [name] Other Clinician: Referring Alaijah Gibler: [name] Treating Brina Umeda/Extender: [name] in Treatment: 69 Wound Status Wound Number: 6 Primary Venous Leg Ulcer Etiology: Wound Location: Left, Lateral Lower  Leg Wound Open Wounding Event: Gradually Appeared Status: Date Acquired: 01/19/2019 Comorbid Cataracts, Asthma, Sleep Apnea, Deep Vein Thrombosis, Weeks Of Treatment: 63 History: Hypertension, Peripheral Venous Disease, Osteoarthritis, Clustered Wound: No Received Chemotherapy, Received Radiation Photos Wound Measurements Length: (cm) 1.5 Width: (cm) 1.6 Depth: (cm) 0.1 Area: (cm) 1.885 Volume: (cm) 0.188 % Reduction in Area: -756.8% % Reduction in Volume: -754.5%  Epithelialization: Small (1-33%) Tunneling: No Undermining: No Wound Description Classification: Full Thickness Without Exposed Support Structu Wound Margin: Flat and Intact Exudate Amount: Medium Exudate Type: Serous Exudate Color: amber res Foul Odor After Cleansing: No Slough/Fibrino Yes Wound Bed Granulation Amount: Large (67-100%) Exposed Structure Granulation Quality: Pink Fascia Exposed: No Necrotic Amount: Small (1-33%) Fat Layer (Subcutaneous Tissue) Exposed: Yes Necrotic Quality: Eschar, Adherent Slough Tendon Exposed: No Muscle Exposed: No Joint Exposed: No Bone Exposed: No Treatment Notes Wound #6 (Left, Lateral Lower Leg) Notes Left: alginate, ABD, 4 layer TCA, Right compression sock Electronic Signature(s) SHANESSA, LIEBLING ([id number]) Signed: 04/06/2020 4:33:52 PM By: [name] Entered By: [name] on 04/06/2020 15:55:39 Chisom, Tenna [name] ([id number]) -------------------------------------------------------------------------------- Vitals Details Patient Name: TYKIRA, [name]. Date of Service: 04/06/2020 3:30 PM Medical Record Number: [id number] Patient Account Number: [id number] Date of Birth/Sex: 1946/08/13 (74 y.o. F) Treating RN: [name] Primary Care Siniyah Evangelist: [name] Other Clinician: Referring Sylvain Hasten: [name] Treating Adyen Bifulco/Extender: [name] in Treatment: 69 Vital Signs Time Taken: 15:42 Temperature (F): 98.2 Height (in):  63 Pulse (bpm): 80 Weight (lbs): 224.7 Respiratory Rate (breaths/min): 18 Body Mass Index (BMI): 39.8 Blood Pressure (mmHg): 161/73 Reference Range: 80 - 120 mg / dl Electronic Signature(s) Signed: 04/06/2020 4:11:28 PM By: [name] RCP, RRT, CHT Entered By: [name] on 04/06/2020 15:45:52

## 2020-04-12 ENCOUNTER — Ambulatory Visit (INDEPENDENT_AMBULATORY_CARE_PROVIDER_SITE_OTHER): Payer: Medicare Other | Admitting: Family Medicine

## 2020-04-12 ENCOUNTER — Encounter: Payer: Self-pay | Admitting: Family Medicine

## 2020-04-12 ENCOUNTER — Other Ambulatory Visit: Payer: Self-pay

## 2020-04-12 VITALS — BP 120/70 | HR 71 | Temp 97.8°F | Ht 63.0 in | Wt 224.4 lb

## 2020-04-12 DIAGNOSIS — G629 Polyneuropathy, unspecified: Secondary | ICD-10-CM | POA: Diagnosis not present

## 2020-04-12 DIAGNOSIS — L97929 Non-pressure chronic ulcer of unspecified part of left lower leg with unspecified severity: Secondary | ICD-10-CM

## 2020-04-12 DIAGNOSIS — I871 Compression of vein: Secondary | ICD-10-CM

## 2020-04-12 DIAGNOSIS — R400 Somnolence: Secondary | ICD-10-CM

## 2020-04-12 DIAGNOSIS — I872 Venous insufficiency (chronic) (peripheral): Secondary | ICD-10-CM

## 2020-04-12 DIAGNOSIS — R195 Other fecal abnormalities: Secondary | ICD-10-CM | POA: Diagnosis not present

## 2020-04-12 DIAGNOSIS — I83893 Varicose veins of bilateral lower extremities with other complications: Secondary | ICD-10-CM

## 2020-04-12 DIAGNOSIS — I83892 Varicose veins of left lower extremities with other complications: Secondary | ICD-10-CM

## 2020-04-12 DIAGNOSIS — I83029 Varicose veins of left lower extremity with ulcer of unspecified site: Secondary | ICD-10-CM

## 2020-04-12 DIAGNOSIS — R609 Edema, unspecified: Secondary | ICD-10-CM

## 2020-04-12 NOTE — Assessment & Plan Note (Addendum)
Discussed oversleeping. Sleep hygiene checklist provided. Encouraged trying to limit daytime naps.  Recent OSA eval stable, no need to change CPAP settings.

## 2020-04-12 NOTE — Assessment & Plan Note (Signed)
Now off nortriptyline - was beneficial but caused sedation.

## 2020-04-12 NOTE — Assessment & Plan Note (Signed)
Pentoxifylline related. Resolved when course completed.

## 2020-04-12 NOTE — Assessment & Plan Note (Signed)
Completed pentoxifylline course with resolution of diarrhea.

## 2020-04-12 NOTE — Assessment & Plan Note (Signed)
Appreciate [facility]VS and wound clinic care. Continue compression stockings and compression wrap. 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

## 2020-04-12 NOTE — Assessment & Plan Note (Signed)
S/p stent. Sees [facility]

## 2020-04-12 NOTE — Patient Instructions (Addendum)
Good to see you today Return as needed or in 6 months for physical.  We will await labs next month   Sleep hygiene checklist: 1. Avoid naps during the day 2. Avoid stimulants such as caffeine and nicotine. Avoid bedtime alcohol (it can speed onset of sleep but the body's metabolism can cause awakenings). 3. All forms of exercise help ensure sound sleep - limit vigorous exercise to morning or late afternoon 4. Avoid food too close to bedtime including chocolate (which contains caffeine) 5. Soak up natural light 6. Establish regular bedtime routine. 7. Associate bed with sleep - avoid TV, computer or phone, reading while in bed. 8. Ensure pleasant, relaxing sleep environment - quiet, dark, cool room.

## 2020-04-12 NOTE — Progress Notes (Signed)
This visit was conducted in person.  BP 120/70 (BP Location: Left Arm, Patient Position: Sitting, Cuff Size: Large)   Pulse 71   Temp 97.8 F (36.6 C) (Temporal)   Ht 5\' 3"  (1.6 m)   Wt 224 lb 7 oz (101.8 kg)   SpO2 97%   BMI 39.76 kg/m    CC: 6 mo f/u visit  Subjective:    Patient ID: [name], female    DOB: 1946/09/23, 74 y.o.   MRN: [id number]  HPI: [name] is a 74 y.o. female presenting on 04/12/2020 for Follow-up (Here for 6 mo f/u.)    May Thurner syndrome followed by [facility] - using compression stockings.  Continues seeing wound clinic for LE venous insufficiency ulcers.   Saw pulm last month for OSA and asthma (thought reactive airways from allergies rather than true asthma). Treated with short prednisone burst with benefit. Doubt RLS. Ongoing daytime somnolence on CPAP at 12. Continues singulair with PRN albuterol.   Upcoming appts with onc and neurosurgery.   Notes ongoing trouble over-sleeping. She is a heavy sleeper. Also takes daytime naps.  She has 16 cats at home.   Nortriptyline caused sedation but was effective for neuropathy. Now off this.      Relevant past medical, surgical, family and social history reviewed and updated as indicated. Interim medical history since our last visit reviewed. Allergies and medications reviewed and updated. Outpatient Medications Prior to Visit  Medication Sig Dispense Refill  . acetaminophen (TYLENOL ARTHRITIS PAIN) 650 MG CR tablet Take 1,300 mg by mouth every 8 (eight) hours as needed for pain.     Marland Kitchen albuterol (VENTOLIN HFA) 108 (90 Base) MCG/ACT inhaler INHALE 2 PUFFS BY MOUTH EVERY 6 HORUS AS NEEDED FOR WHEEZING/SHORTNESS OF BREATH 8.5 g 3  . aspirin EC 81 MG tablet Take 81 mg by mouth at bedtime. Melanoma prevention    . famotidine (PEPCID) 20 MG tablet Take 1 tablet (20 mg total) by mouth at bedtime.    . furosemide (LASIX) 40 MG tablet TAKE 1 TABLET BY MOUTH EVERY DAY 30 tablet 5  . loratadine (CLARITIN) 10  MG tablet Take 10 mg by mouth daily.    Marland Kitchen losartan (COZAAR) 25 MG tablet TAKE 2 TABLETS BY MOUTH EVERY DAY 60 tablet 5  . meloxicam (MOBIC) 15 MG tablet TAKE 1 TABLET BY MOUTH EVERY DAY AS NEEDED FOR PAIN 30 tablet 1  . montelukast (SINGULAIR) 10 MG tablet TAKE 1 TABLET BY MOUTH EVERY DAY 30 tablet 3  . Multiple Vitamin (MULTIVITAMIN WITH MINERALS) TABS tablet Take 1 tablet by mouth daily.    . Polyvinyl Alcohol-Povidone (TEARS PLUS OP) Place 1 drop into both eyes daily as needed (dry eyes/ redness/ burning).    . potassium chloride SA (KLOR-CON) 20 MEQ tablet Take 1 tablet (20 mEq total) by mouth 2 (two) times daily. 180 tablet 3  . PRESCRIPTION MEDICATION CPAP    . Probiotic Product (PROBIOTIC DAILY PO) Take 1 tablet by mouth daily.     Marland Kitchen triamcinolone (NASACORT ALLERGY 24HR) 55 MCG/ACT AERO nasal inhaler Place 2 sprays into the nose daily. In each nostril    . UNABLE TO FIND Take by mouth.    . Vitamin D, Ergocalciferol, (DRISDOL) 1.25 MG (50000 UNIT) CAPS capsule TAKE 1 CAPSULE (50,000 UNITS TOTAL) BY MOUTH EVERY 7 (SEVEN) DAYS. 12 capsule 3  . predniSONE (DELTASONE) 10 MG tablet Take 1 tablet (10 mg total) by mouth daily with breakfast. Take 2 tabs  daily with food times 5 days, then 1 tab daily with food, 5days then stop 15 tablet 0   No facility-administered medications prior to visit.     Per HPI unless specifically indicated in ROS section below Review of Systems Objective:  BP 120/70 (BP Location: Left Arm, Patient Position: Sitting, Cuff Size: Large)   Pulse 71   Temp 97.8 F (36.6 C) (Temporal)   Ht 5\' 3"  (1.6 m)   Wt 224 lb 7 oz (101.8 kg)   SpO2 97%   BMI 39.76 kg/m   Wt Readings from Last 3 Encounters:  04/12/20 224 lb 7 oz (101.8 kg)  03/22/20 227 lb (103 kg)  02/12/20 223 lb 14.4 oz (101.6 kg)      Physical Exam Vitals and nursing note reviewed.  Constitutional:      Appearance: Normal appearance. She is obese. She is not ill-appearing.  Cardiovascular:      Rate and Rhythm: Normal rate and regular rhythm.     Pulses: Normal pulses.     Heart sounds: Murmur (2/6 systolic best at RUSB) present.  Pulmonary:     Effort: Pulmonary effort is normal. No respiratory distress.     Breath sounds: Normal breath sounds. No wheezing, rhonchi or rales.  Musculoskeletal:     Right lower leg: Edema present.     Left lower leg: Edema present.     Comments:  R leg with compression stocking L leg with compression bandage  Neurological:     Mental Status: She is alert.  Psychiatric:        Mood and Affect: Mood normal.        Behavior: Behavior normal.       Results for orders placed or performed in visit on 10/07/19  TSH  Result Value Ref Range   TSH 3.29 0.35 - 4.50 uIU/mL  vit d  Result Value Ref Range   VITD 40.79 30.00 - 100.00 ng/mL  Basic metabolic panel  Result Value Ref Range   Sodium 142 135 - 145 mEq/L   Potassium 4.2 3.5 - 5.1 mEq/L   Chloride 104 96 - 112 mEq/L   CO2 30 19 - 32 mEq/L   Glucose, Bld 104 (H) 70 - 99 mg/dL   BUN 13 6 - 23 mg/dL   Creatinine, Ser 0.71 0.40 - 1.20 mg/dL   GFR 80.64 >60.00 mL/min   Calcium 9.7 8.4 - 10.5 mg/dL   Assessment & Plan:  This visit occurred during the SARS-CoV-2 public health emergency.  Safety protocols were in place, including screening questions prior to the visit, additional usage of staff PPE, and extensive cleaning of exam room while observing appropriate contact time as indicated for disinfecting solutions.  Labwork offered - will await onc labs next month Problem List Items Addressed This Visit    Venous stasis ulcer of left lower leg with edema of left lower leg (HCC)    Completed pentoxifylline course with resolution of diarrhea.       Varicose veins of both lower extremities with complications   Somnolence - Primary    Discussed oversleeping. Sleep hygiene checklist provided. Encouraged trying to limit daytime naps.  Recent OSA eval stable, no need to change CPAP settings.         Peripheral neuropathy    Now off nortriptyline - was beneficial but caused sedation.       May-Thurner syndrome    S/p stent. Sees [facility]      RESOLVED: Loose stools  Pentoxifylline related. Resolved when course completed.       Chronic venous insufficiency    Appreciate [facility] and wound clinic care. Continue compression stockings and compression wrap. 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          No orders of the defined types were placed in this encounter.  No orders of the defined types were placed in this encounter.   Patient Instructions  Good to see you today Return as needed or in 6 months for physical.  We will await labs next month   Sleep hygiene checklist: 1. Avoid naps during the day 2. Avoid stimulants such as caffeine and nicotine. Avoid bedtime alcohol (it can speed onset of sleep but the body's metabolism can cause awakenings). 3. All forms of exercise help ensure sound sleep - limit vigorous exercise to morning or late afternoon 4. Avoid food too close to bedtime including chocolate (which contains caffeine) 5. Soak up natural light 6. Establish regular bedtime routine. 7. Associate bed with sleep - avoid TV, computer or phone, reading while in bed. 8. Ensure pleasant, relaxing sleep environment - quiet, dark, cool room.    Follow up plan: Return in about 6 months (around 10/12/2020) for medicare wellness visit.  [name], MD

## 2020-04-13 ENCOUNTER — Encounter: Payer: Medicare Other | Admitting: Internal Medicine

## 2020-04-13 ENCOUNTER — Other Ambulatory Visit: Payer: Self-pay | Admitting: *Deleted

## 2020-04-13 DIAGNOSIS — I82891 Chronic embolism and thrombosis of other specified veins: Secondary | ICD-10-CM | POA: Diagnosis not present

## 2020-04-13 DIAGNOSIS — L97211 Non-pressure chronic ulcer of right calf limited to breakdown of skin: Secondary | ICD-10-CM | POA: Diagnosis not present

## 2020-04-13 DIAGNOSIS — L97221 Non-pressure chronic ulcer of left calf limited to breakdown of skin: Secondary | ICD-10-CM | POA: Diagnosis not present

## 2020-04-13 DIAGNOSIS — I871 Compression of vein: Secondary | ICD-10-CM

## 2020-04-13 DIAGNOSIS — I87332 Chronic venous hypertension (idiopathic) with ulcer and inflammation of left lower extremity: Secondary | ICD-10-CM | POA: Diagnosis not present

## 2020-04-15 ENCOUNTER — Telehealth (HOSPITAL_COMMUNITY): Payer: Self-pay | Admitting: *Deleted

## 2020-04-15 ENCOUNTER — Other Ambulatory Visit: Payer: Self-pay

## 2020-04-15 ENCOUNTER — Ambulatory Visit (HOSPITAL_COMMUNITY)
Admission: RE | Admit: 2020-04-15 | Discharge: 2020-04-15 | Disposition: A | Discharge status: Home / Self Care | Payer: Medicare Other | Source: Ambulatory Visit | Attending: Vascular Surgery | Admitting: Vascular Surgery

## 2020-04-15 ENCOUNTER — Encounter: Payer: Self-pay | Admitting: Vascular Surgery

## 2020-04-15 ENCOUNTER — Ambulatory Visit (INDEPENDENT_AMBULATORY_CARE_PROVIDER_SITE_OTHER): Payer: Medicare Other | Admitting: Vascular Surgery

## 2020-04-15 VITALS — BP 144/87 | HR 77 | Temp 97.7°F | Resp 20 | Ht 63.0 in | Wt 225.0 lb

## 2020-04-15 DIAGNOSIS — I83892 Varicose veins of left lower extremities with other complications: Secondary | ICD-10-CM

## 2020-04-15 DIAGNOSIS — R609 Edema, unspecified: Secondary | ICD-10-CM | POA: Diagnosis not present

## 2020-04-15 DIAGNOSIS — I871 Compression of vein: Secondary | ICD-10-CM | POA: Diagnosis not present

## 2020-04-15 DIAGNOSIS — I83029 Varicose veins of left lower extremity with ulcer of unspecified site: Secondary | ICD-10-CM

## 2020-04-15 DIAGNOSIS — L97221 Non-pressure chronic ulcer of left calf limited to breakdown of skin: Secondary | ICD-10-CM | POA: Diagnosis not present

## 2020-04-15 DIAGNOSIS — L97929 Non-pressure chronic ulcer of unspecified part of left lower leg with unspecified severity: Secondary | ICD-10-CM | POA: Diagnosis not present

## 2020-04-15 NOTE — Progress Notes (Signed)
[name], [name] ([id number]) Visit Report for 04/13/2020 HPI Details Patient Name: [name], [name]. Date of Service: 04/13/2020 12:45 PM Medical Record Number: [id number] Patient Account Number: [id number] Date of Birth/Sex: 08-Dec-1945 (74 y.o. F) Treating RN: Cornell Barman Primary Care Provider: [name] Other Clinician: Referring Provider: [name] Treating Provider/Extender: Tito Dine in Treatment: 62 History of Present Illness HPI Description: Pleasant 74 year old with history of chronic venous insufficiency. No diabetes or peripheral vascular disease. Left ABI 1.29. Questionable history of left lower extremity DVT. She developed a recurrent ulceration on her left lateral calf in December 2015, which she attributes to poor diet and subsequent lower extremity edema. She underwent endovenous laser ablation of her left greater saphenous vein in 2010. She underwent laser ablation of accessory branch of left GSV in April 2016 by Dr. Kellie Simmering at [facility]. She was previously wearing Unna boots, which she tolerated well. Tolerating 2 layer compression and cadexomer iodine. She returns to clinic for follow-up and is without new complaints. She denies any significant pain at this time. She reports persistent pain with pressure. No claudication or ischemic rest pain. No fever or chills. No drainage. READMISSION 11/13/16; this is a 74 year old woman who is not a diabetic. She is here for a review of a painful area on her left medial lower extremity. I note that she was seen here previously last year for wound I believe to be in the same area. At that time she had undergone previously a left greater saphenous vein ablation by Dr. Kellie Simmering and she had a ablation of the anterior accessory branch of the left greater saphenous vein in March 2016. Seeing that the wound actually closed over. In reviewing the history with her today the ulcer in this area has been recurrent. She describes  a biopsy of this area in 2009 that only showed stasis physiology. 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 Do not see that she has ever had arterial studies in the left leg. She tells me she has a follow-up with Dr. Kellie Simmering in roughly 10 days In any case she developed the reopening of this area roughly a month ago. On the background of this she describes rapidly increasing edema which has responded to Lasix 40 mg and metolazone 2.5 mg as well as the patient's lymph massage. She has been told she has both venous insufficiency and lymphedema but she cannot tolerate compression stockings 11/28/16; the patient saw Dr. Kellie Simmering recently. Per the patient he did arterial Dopplers in the office that did not show evidence of arterial insufficiency, per the patient he stated "treat this like an ordinary venous ulcer". She also saw her dermatologist Dr. Ronnald Ramp who felt that this was more of a vascular ulcer. In general things are improving although she arrives today with increasing bilateral lower extremity edema with weeping a deeper fluid through the wound on the left medial leg compatible with some degree of lymphedema 12/04/16; the patient's wound is fully epithelialized but I don't think fully healed. We will do another week of depression with Promogran and TCA however I suspect we'll be able to discharge her next week. This is a very unusual-looking wound which was initially a figure-of-eight type wound lying on its side surrounded by petechial like hemorrhage. She  has had venous ablation on this side. She apparently does not have an arterial issue per Dr. Kellie Simmering. She saw her dermatologist thought it was "vascular". Patient is definitely going to need ongoing compression and I talked about  this with her today she will go to elastic therapy after she leaves here next week 12/11/16; the patient's wound is not completely closed today. She has surrounding scar tissue and in further discussion with the patient it would appear that she had ulcers in this area in 2009 for a prolonged period of time ultimately requiring a punch biopsy of this area that only showed venous insufficiency. I did not previously pickup on this part of the history from the patient. 12/18/16; the patient's wound is completely epithelialized. There is no open area here. She has significant bilateral venous insufficiency with secondary lymphedema to a mild-to-moderate degree she does not have compression stockings.. She did not say anything to me when I was in the room, she told our intake nurse that she was still having pain in this area. This isn't unusual recurrent small open area. She is going to go to elastic therapy to obtain compression stockings. 12/25/16; the patient's wound is fully epithelialized. There is no open area here. The patient describes some continued episodic discomfort in this area medial left calf. However everything looks fine and healed here. She is been to elastic therapy and caught herself 15-20 mmHg stockings, they apparently were having trouble getting 20-30 mm stockings in her size 01/22/17; this is a patient we discharged from the clinic a month ago. She has a recurrent open wound on her medial left calf. She had 15 mm support stockings. I told her I thought she needed 20-30 mm compression stockings. She tells me that she has been ill with hospitalization secondary to asthma and is been found to have severe hypokalemia likely secondary to a combination of Lasix and metolazone. This morning she noted blistering and leaking fluid on the posterior part of her left leg. She called our intake nurse urgently and we was saw her this afternoon. She has not had any real discomfort here. I don't know  that she's been wearing any stockings on this leg for at least 2-3 days. ABIs in this clinic were 1.21 on the right and 1.3 on the left. She is previously seen vascular surgery who does not think that there is a peripheral arterial issue. 01/30/17; Patient arrives with no open wound on the left leg. She has been to elastic therapy and obtained 20-14mmhg below knee stockings and she has one on the right leg today. READMISSION 02/19/18; this Cirrincione is a now 74 year old patient we've had in this clinic perhaps 3 times before. I had last looked at her from January 07 December 2016 with an area on the medial left leg. We discharged her on 12/25/16 however she had to be readmitted on 01/22/17 with a recurrence. I have in my notes that we discharged her on 20-30 mm stockings although she tells me she was only wearing support hose because she cannot get stockings on predominantly related to her cervical spine surgery/issues. She has had previous ablations done by vein and vascular in [place] including a great saphenous vein ablation on the left with an anterior accessory branch ablation I think both of these were in 2016. On one of the previous visit she had a biopsy noted 2009 that was negative. She is not felt to have an arterial issue. She is not a diabetic. She does  have a history of obstructive sleep apnea hypertension asthma as well as chronic venous insufficiency and lymphedema. On this occasion she noted 2 dry scaly patch on her left leg. She tried to put lotion on this it didn't really help. There were 2 open areas.the [name], [name] ([id number]) patient has been seeing her primary physician from 02/05/18 through 02/14/18. She had Unna boots applied. The superior wound now on the lateral left leg has closed but she's had one wound that remains open on the lateral left leg. This is not the same spot as we dealt with in 2018. ABIs in this clinic were 1.3 bilaterally 02/26/18; patient has a small wound on  the left lateral calf. Dimensions are down. She has chronic venous insufficiency and lymphedema. 03/05/18; small open area on the left lateral calf. Dimensions are down. Tightly adherent necrotic debris over the surface of the wound which was difficult to remove. Also the dressing [over collagen] stuck to the wound surface. This was removed with some difficulty as well. Change the primary dressing to Hydrofera Blue ready 03/12/18; small open area on the left lateral calf. Comes in with tightly adherent surface eschar as well as some adherent Hydrofera Blue. 03/19/18; open area on the left lateral calf. Again adherent surface eschar as well as some adherent Hydrofera Blue nonviable subcutaneous tissue. She complained of pain all week even with the reduction from 4-3 layer compression I put on last week. Also she had an increase in her ankle and calf measurements probably related to the same thing. 03/26/18; open area on the left lateral calf. A very small open area remains here. We used silver alginate starting last week as the Hydrofera Blue seem to stick to the wound bed. In using 4-layer compression 04/02/18; the open area in the left lateral calf at some adherent slough which I removed there is no open area here. We are able to transition her into her own compression stocking. Truthfully I think this is probably his support hose. However this does not maintain skin integrity will be limited. She cannot put over the toe compression stockings on because of neck problems hand problems etc. She is allergic to the lining layer of juxta lites. We might be forced to use extremitease stocking should this fail READMIT 11/24/2018 Patient is now a 74 year old woman who is not a diabetic. She has been in this clinic on at least 3 previous occasions largely with recurrent wounds on her left leg secondary to chronic venous insufficiency with secondary lymphedema. Her situation is complicated by inability to  get stockings on and an allergy to neoprene which is apparently a component and at least juxta lites and other stockings. As a result she really has not been wearing any stockings on her legs. She tells [place] that roughly 2 or 3 weeks ago she started noticing a stinging sensation just above her ankle on the left medial aspect. She has been diagnosed with pseudogout and she wondered whether this was what she was experiencing. She tried to dress this with something she bought at the store however subsequently it pulled skin off and now she has an open wound that is not improving. She has been using Vaseline gauze with a cover bandage. She saw her primary doctor last week who put an Haematologist on her. ABIs in this clinic was 1.03 on the left 2/12; the area is on the left medial ankle. Odd-looking wound with what looks to be surface epithelialization but a multitude of  small petechial openings. This clearly not closed yet. We have been using silver alginate under 3 layer compression with TCA 2/19; the wound area did not look quite as good this week. Necrotic debris over the majority of the wound surface which required debridement. She continues to have a multitude of what looked to be small petechial openings. She reminds [place] that she had a biopsy on this initially during her first outbreak in 2015 in [place] [facility]. She expresses concern about this being a possible melanoma. 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 I assured her that this does not look anything like melanoma. Except for the petechial reaction it does look like a venous insufficiency area and she certainly has evidence of this on both sides 2/26; a difficult area on the left medial ankle. The patient clearly has chronic venous hypertension with some degree of lymphedema. The odd thing about the area is the small petechial hemorrhages. I am not  really sure how to explain this. This was present last time and this is not a compression injury. We have been using Hydrofera Blue which I changed to last week 3/4; still using Hydrofera Blue. Aggressive debridement today. She does not have known arterial issues. She has seen Dr. Kellie Simmering at [facility] vein and vascular and and has an ablation on the left. [Anterior accessory branch of the greater saphenous]. From what I remember they did not feel she had an arterial issue. The patient has had this area biopsied in 2009 at [facility] dermatology and by her recollection they said this was "stasis". She is also follow-up with dermatology locally who thought that this was more of a vascular issue 3/11; using Hydrofera Blue. Aggressive debridement today. She does not have an arterial issue. We are using 3 layer compression although we may need to go to 4. The patient has been in for multiple changes to her wrap since I last saw her a week ago. She says that the area was leaking. I do not have too much more information on what was found 01/19/19 on evaluation today patient was actually being seen for a nurse visit when unfortunately she had the area on her left lateral lower extremity as well as weeping from the right lower extremity that became apparent. Therefore we did end up actually seeing her for a full visit with myself. She is having some pain at this site as well but fortunately nothing too significant at this point. No fevers, chills, nausea, or vomiting noted at this time. 3/18-Patient is back to the clinic with the left leg venous leg ulcer, the ulcer is larger in size, has a surface that is densely adherent with fibrinous tissue, the Hydrofera Blue was used but is densely adherent and there was difficulty in removing it. The right lower extremity was also wrapped for weeping edema. Patient has a new area over the left lateral foot above the malleolus that is small and appears to have no debris  with intact surrounding skin. Patient is on increased dose of Lasix also as a means to edema management 3/25; the patient has a nonhealing venous ulcer on the medial left leg and last week developed a smaller area on the lateral left calf. We have been using Hydrofera Blue with a contact layer. 4/1; no major change in these wounds areas. Left medial and more recently left lateral calf. I tried Iodoflex last week to aid in debridement  she did not tolerate this. She stated her pain was terrible all week. She took the top layer of the 4 layer compression off. 4/8; the patient actually looks somewhat better in terms of her more prominent left lateral calf wound. There is some healthy looking tissue here. She is still complaining of a lot of discomfort. 4/15; patient in a lot of pain secondary to sciatica. She is on a prednisone taper prescribed by her primary physician. She has the 2 areas one on the left medial and more recently a smaller area on the left lateral calf. Both of these just above the malleoli 4/22; her back pain is better but she still states she is very uncomfortable and now feels she is intolerant to the [employer]. No real change in the wounds we have been using Sorbact. She has been previously intolerant to Iodoflex. There is not a lot of option about what we can use to debride this wound under compression that she no doubt needs. sHe states Ultram no longer works for her pain 4/29; no major change in the wounds slightly increased depth. Surface on the original medial wound perhaps somewhat improved however the more recent area on the lateral left ankle is 100% covered in very adherent debris we have been using Sorbact. She tolerates 4 layer compression well and her edema control is a lot better. She has not had to come in for a nurse check 5/6; no major change in the condition of the wounds. She did consent to debridement today which was done with some difficulty. Continuing Sorbact.  She did not tolerate Iodoflex. She was in for a check of her compression the day after we wrapped her last week this was adjusted but nothing much was found 5/13; no major change in the condition or area of the wounds. I was able to get a fairly aggressive debridement done on the lateral left leg wound. Even using Sorbact under compression. She came back in on Friday to have the wrap changed. She says she felt uncomfortable on the [name], [name]. ([id number]) lateral aspect of her ankle. She has a long history of chronic venous insufficiency including previous ablation surgery on this side. 5/20-Patient returns for wounds on left leg with both wounds covered in slough, with the lateral leg wound larger in size, she has been in 3 layer compression and felt more comfortable, she describes pain in ankle, in leg and pins and needles in foot, and is about to try Pamelor for this 6/3; wounds on the left lateral and left medial leg. The area medially which is the most recent of the 2 seems to have had the largest increase in dimensions. We have been using Sorbac to try and debride the surface. She has been to see orthopedics they apparently did a plain x-ray that was indeterminant. Diagnosed her with neuropathy and they have ordered an MRI to determine if there is underlying osteomyelitis. This was not high on my thought list but I suppose it is prudent. We have advised her to make an appointment with vein and vascular in [place]. She has a history of a left greater saphenous and accessory vein ablations I wonder if there is anything else that can be done from a surgical point of view to help in these difficult refractory wounds. We have previously healed this wound on one occasion but it keeps on reopening [medial side] 6/10; deep tissue culture I did last week I think on the left medial wound showed  both moderate E. coli and moderate staph aureus [MSSA]. She is going to require antibiotics and I have  chosen Augmentin. We have been using Sorbact and we have made better looking wound surface on both sides but certainly no improvement in wound area. She was back in last Friday apparently for a dressing changes the wrap was hurting her outer left ankle. She has not managed to get a hold of vein and vascular in [place]. We are going to have to make her that appointment 6/17; patient is tolerating the Augmentin. She had an MRI that I think was ordered by orthopedic surgeon this did not show osteomyelitis or an abscess did suggest cellulitis. We have been using Sorbact to the lateral and medial ankles. We have been trying to arrange a follow-up appointment with vein and vascular in [place] or did her original ablations. We apparently an area sent the request to vein and vascular in [facility] 6/24; patient has completed the Augmentin. We do not yet have a vein and vascular appointment in [place]. I am not sure what the issue is here we have asked her to call tomorrow. We are using Sorbact. Making some improvements and especially the medial wound. Both surfaces however look better medial and lateral. 7/1; the patient has been in contact with vein and vascular in [place] but has not yet received an appointment. Using Sorbact we have gradually improve the wound surface with no improvement in surface area. She is approved for Apligraf but the wound surface still is not completely viable. She has not had to come in for a dressing change 7/8; the patient has an appointment with vein and vascular on 7/31 which is a Friday afternoon. She is concerned about getting back here for [place] to dress her wounds. I think it is important to have them goal for her venous reflux/history of ablations etc. to see if anything else can be done. She apparently tested positive for 1 of the blood tests with regards to lupus and saw a rheumatologist. He has raised the issue of vasculitis again. I have had this thought in  the past however the evidence seems overwhelming that this is a venous reflux etiology. If the rheumatologist tells me there is clinical and laboratory investigation is positive for lupus I will rethink this. 7/15; the patient's wound surfaces are quite a bit better. The medial area which was her original wound now has no depth although the lateral wound which was the more recent area actually appears larger. Both with viable surfaces which is indeed better. Using Sorbact. I wanted to use Apligraf on her however there is the issue of the vein and vascular appointment on 7/31 at 2:00 in the afternoon which would not allow her to get back to be rewrapped and they would no doubt remove the graft 7/22; the patient's wound surfaces have moderate amount of debris although generally look better. The lateral one is larger with 2 small satellite areas superiorly. We are waiting for her vein and vascular appointment on 7/31. She has been approved for Apligraf which I would like to use after th 7/29; wound surfaces have improved no debridement is required we have been using Sorbact. She sees vein and vascular on Friday with this so question of whether anything can be done to lessen the likelihood of recurrence and/or speed the healing of these areas. She is already had previous ablations. She no doubt has severe venous hypertension 8/5-Patient returns at 1 week, she was in [employer]  for 3 days by her podiatrist, we have been using so backed to the wound, she has increased pain in both the wounds on the left lower leg especially the more distal one on the lateral aspect 8/12-Patient returns at 1 week and she is agreeable to having debridement in both wounds on her left leg today. We have been using Sorbact, and vascular studies were reviewed at last visit 8/19; the patient arrives with her wounds fairly clean and no debridement is required. We have used Sorbact which is really done a nice job in cleaning up  these very difficult wound surfaces. The patient saw Dr. Donzetta Matters of vascular surgery on 7/31. He did not feel that there was an arterial component. He felt that her treated greater saphenous vein is adequately addressed and that the small saphenous vein did not appear to be involved significantly. She was also noted to have deep venous reflux which is not treatable. Dr. Donzetta Matters mentioned the possibility of a central obstructive component leading to reflux and he offered her central venography. She wanted to discuss this or think about it. I have urged her to go ahead with this. She has had recurrent difficult wounds in these areas which do heal but after months in the clinic. If there is anything that can be done to reduce the likelihood of this I think it is worth it. 9/2 she is still working towards getting follow-up with Dr. Donzetta Matters to schedule her CT. Things are quite a bit worse venography. I put Apligraf on 2 weeks ago on both wounds on the medial and lateral part of her left lower leg. She arrives in clinic today with 3 superficial additional wounds above the area laterally and one below the wound medially. She describes a lot of discomfort. I think these are probably wrapped injuries. Does not look like she has cellulitis. 07/20/2019 on evaluation today patient appears to be doing somewhat poorly in regard to her lower extremity ulcers. She in fact showed signs of erythema in fact we may even be dealing with an infection at this time. Unfortunately I am unsure if this is just infection or if indeed there may be some allergic reaction that occurred as a result of the Apligraf application. With that being said that would be unusual but nonetheless not impossible in this patient is one who is unfortunately allergic to quite a bit. Currently we have been using the Sorbact which seems to do as well as anything for her. I do think we may want to obtain a culture today to see if there is anything showing up  there that may need to be addressed. 9/16; noted that last week the wounds look worse in 1 week follow-up of the Apligraf. Using Sorbact as of 2 days ago. She arrives with copious amounts of drainage and new skin breakdown on the back of the left calf. The wounds arm more substantial bilaterally. There is a fair amount of swelling in the left calf no overt DVT there is edema present I think in the left greater than right thigh. She is supposed to go on 9/28 for CT venography. The wounds on the medial and lateral calf are worse and she has new skin breakdown posteriorly at least new for me. This is almost developing into a circumferential wound area The Apligraf was taken off last week which I agree with things are not going in the right direction a culture was done we do not have that back yet. She is  on Augmentin that she started 2 days ago 9/23; dressing was changed by her nurses on Monday. In general there is no improvement in the wound areas although the area looks less angry than last week. She did get Augmentin for MSSA cultured on the 14th. She still appears to have too much swelling in the left leg even with 3 layer compression 9/30; the patient underwent her procedure on 9/28 by Dr. Donzetta [facility]ters at [facility]. She was discovered to have the common iliac vein measuring 12.2 mm but at the level of L4-L5 measured 3 mm. After stenting it measured 10 mm. 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 Rouleaux flow in the common femoral and femoral vein was observed much improved after stenting. We are using silver alginate to the wounds on the medial and lateral ankle on the left. 4 layer compression 10/7; the patient had fluid swelling around her knee and 4 layer compression. At the advice of vein and vascular this was reduced to 3 layer which she is tolerating better. We have been using silver alginate under 3 layer compression since last Friday 10/14; arrives with the  areas on the left ankle looking a lot better. Inflammation in the area also a lot better. She came in for a nurse check on [name], [name] ([id number]) 10/9 10/21; continued nice improvement. Slight improvements in surface area of both the medial and lateral wounds on the left. A lot of the satellite lesions in the weeping erythema around these from stasis dermatitis is resolved. We have been using silver alginate 10/28; general improvement in the entire wound areas although not a lot of change in dimensions the wound certainly looks better. There is a lot less in terms of venous inflammation. Continue silver alginate this week however look towards Hydrofera Blue next week 11/4; very adherent debris on the medial wound left wound is not as bad. We have been using silver alginate. Change to [facility] today 11/11; very adherent debris on both wound areas. She went to vein and vascular last week and follow-up they put in [place] boot on this today. He says the [facility] was adherent. Wound is definitely not as good as last week. Especially on the left there the satellite lesions look more prominent 11/18; absolutely no better. erythema on lateral aspect with tenderness. 09/30/2019 on evaluation today patient appears to actually be doing better. Dr. Dellia Nims did put her on doxycycline last week which I do believe has helped her at this point. Fortunately there is no signs of active infection at this time. No fevers, chills, nausea, vomiting, or diarrhea. I do believe he may want extend the doxycycline for 7 additional days just to ensure everything does completely cleared up the patient is in agreement with that plan. Otherwise she is going require some sharp debridement today 12/2; patient is completing a 2-week course of doxycycline. I gave her this empirically for inflammation as well as infection when I last saw her 2 weeks ago. All of this seems to be better. She is using silver alginate she  has the area on the medial aspect of the larger area laterally and the 2 small satellite regions laterally above the major wound. 12/9; the patient's wound on the left medial and left lateral calf look really quite good. We have been using silver alginate. She saw vein and vascular in follow-up on 10/09/2019. She has had a previous left greater saphenous vein ablation by Dr. Oscar La in 2016.  More recently she underwent a left common iliac vein stent by Dr. 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 The swelling is improved and certainly the wounds have improved. The patient shows [place] today area on the right medial calf there is almost no wound but leaking lymphedema. She says she start this started 3 or 4 days ago. She did not traumatize it. It is not painful. She does not wear compression on that side 12/16; the patient continues to do well laterally. Medially still requiring debridement. The area on the right calf did not materialize to anything and is not currently open. We wrapped this last time. She has support stockings for that leg although I am not sure they are going to provide adequate compression 12/23; the lateral wound looks stable. Medially still requiring debridement for tightly adherent fibrinous debris. We've been using silver alginate. Surface area not any different 12/30; neither wound is any better with regards to surface and the area on the left lateral is larger. I been using silver alginate to the left lateral which look quite good last week and Sorbact to the left medial 11/11/2019. Lateral wound area actually looks better and somewhat smaller. Medial still requires a very aggressive debridement today. We have been using Sorbact on both wound areas 1/13; not much better still adherent debris bilaterally. I been using Sorbact. She has severe venous hypertension. Probably some degree of dermal fibrosis distally. I wonder whether tighter compression might help and I am going  to try that today. We also need to work on the bioburden 1/20; using Sorbact. She has severe venous hypertension status post stent placement for pelvic vein compression. We applied gentamicin last time to see if we could reduce bioburden I had some discussion with her today about the use of pentoxifylline. This is occasionally used in this setting for wounds with refractory venous insufficiency. However this interacts with Plavix. She tells me that she was put on this after stent placement for 3 months. She will call Dr. Claretha Cooper office to discuss 1/27; we are using gentamicin under Sorbact. 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 She has a stent. Wound medially is measuring smaller this week. Laterally measuring slightly larger although she has some satellite lesions superiorly 2/3; gentamicin under Sorbact under 4-layer compression. 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 She has a stent on Plavix. Her wounds are measuring smaller this week. More substantially laterally where there is a satellite lesion superiorly. 2/10; gentamicin under Sorbac. 4-layer compression. Patient communicated with Dr. Donzetta Matters at vein and vascular in [place]. He is okay with the patient coming off Plavix I will therefore start her on pentoxifylline for a 1 month trial. In general her wounds look better today. I had some concerns about swelling in the left thigh however she measures 61.5 on the right and 63 on the mid thigh which does not suggest there is any difficulty. The patient is not describing any pain. 2/17; gentamicin under Sorbac 4-layer compression. She has been on pentoxifylline for 1 week and complains of loose stool. No nausea she is eating and drinking well 2/24; the patient apparently came in 2 days ago for a nurse visit when her wrap fell down. Both areas look a little worse this week macerated medially and satellite lesions laterally. Change to  silver alginate today 3/3; wounds are larger today especially medially. She also has more swelling in her foot lower leg and I even noted some swelling in  her posterior thigh which is tender. I wonder about the patency of her stent. Fortuitously she sees Dr. Claretha Cooper group on Friday 3/10; Mrs. Mastrogiovanni was seen by vein and vascular on 3/5. The patient underwent ultrasound. There was no evidence of thrombosis involving the IVC no evidence of thrombosis involving the right common iliac vein there is no evidence of thrombosis involving the right external iliac vein the left external vein is also patent. The right common iliac vein stent appears patent bilateral common femoral veins are compressible and appear patent. I was concerned about the left common iliac stent however it looks like this is functional. She has some edema in the posterior thigh that was tender she still has that this week. I also note they had trouble finding the pulses in her left foot and booked her for an ABI baseline in 4 weeks. She will follow up in 6 months for repeat IVC duplex. The patient stopped the pentoxifylline because of diarrhea. It does not look like that was being effective in any case. I have advised her to go back on her aspirin 81 mg tablet, vascular it also suggested this 3/17; comes in today with her wound surfaces a lot better. The excoriations from last week considerably better probably secondary to the TCA. We have been using silver alginate 3/24; comes in today with smaller wounds both medially and laterally. Both required debridement. There are 2 small satellite areas superiorly laterally. She also has a very odd bandlike area in the mid calf almost looking like there was a weakness in the wrap in a localized area. I would write this off as being this however anteriorly she has a small raised ballotable area that is very tender almost reminiscent of an abscess but there was no obvious purulent surface to  it. 02/04/20 upon evaluation today patient appears to be doing fairly well in regard to her wounds today. Fortunately there is no signs of active [name], [name]. ([id number]) infection at this time. No fevers, chills, nausea, vomiting, or diarrhea. She has been tolerating the dressing changes without complication. Fortunately I feel like she is showing signs of improvement although has been sometime since have seen her. Nonetheless the area of concern that Dr. Dellia Nims had last week where she had possibly an area of the wrap that was we can allow the leg to bulge appears to be doing significantly better today there is no signs of anything worsening. 4/7; the patient's wounds on her medial and lateral left leg continue to contract. We have been using a regular alginate. Last week she developed an area on the right medial lower leg which is probably a venous ulcer as well. 4/14; the wounds on her left medial and lateral lower leg continue to contract. Surface eschar. We have been using regular alginate. The area on the right medial lower leg is closed. We have been putting both legs under 4-layer contraction. The patient went back to see vein and vascular she had arterial studies done which were apparently "quite good" per the patient although I have not read their notes I have never felt she had an arterial issue. 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 This is been longstanding and refractory to exercise, leg elevation and longstanding use of compression wraps in our clinic as well as compression stockings on the times we have been able to get these to heal 4/21; we thought she actually might be close this week however she arrives in  clinic with a lot of edema in her upper left calf and into her posterior thigh. This is been an intermittent problem here. She says the wrap fell down but it was replaced with a nurse visit on Monday. We are using calcium  alginate to the wounds and the wound sizes there not terribly larger than last week but there is a lot more edema 4/28; again wound edges are smaller on both sides. Her edema is better controlled than last time. She is obtained her compression pumps from [employer] although they have not been to her home to set these up. 5/5; left medial and left lateral both look stable. I am not sure the medial is any smaller. We have been using calcium alginate under 4-layer compression. oShe had an area on the right medial. This was eschared today. We have been wrapping this as well. She does not tolerate external compression stockings due to a history of various contact allergies. She has her compression pumps however the representative from the company is coming on her to show her how to use these tomorrow 5/19; patient with severe chronic venous insufficiency secondary to central venous disease. She had a stent placed in her left common iliac vein. She has done better since but still difficult to control wounds. She comes in today with nothing open on the right leg. Her areas on the left medial and left lateral are just about closed. We are using calcium alginate under 4-layer compression. She is using her external compression pumps at home She only has 15-20 support stockings. States she cannot get anything tighter than that on. 03/30/20-Patient returns at 1 week, the wounds on the left leg are both slightly bigger, the last week she was on 3 layer compression which started to slide down. She is starting to use her lymphedema pumps although she stated on 1 day her right ankle started to swell up and she have to stop that day. Unfortunately the open area seem to oscillate between improving to the point of healing and then flaring up all to do with effectiveness of compression or lack of due to the left leg topography not keeping the compression wraps from rolling down 6/2 patient comes in with a 15/20  mmHg stocking on the right leg. She tells me that she developed a lot of swelling in her ankles she saw orthopedics she was felt to possibly be having a flare of pseudogout versus some other type of arthritis. She was put on steroids for a respiratory issue so that helps with the inflammation. She has not been using the pumps all week. She thinks the left thigh is more swollen than usual and I would agree with that. She has an appointment with Dr. Donzetta Matters 9 days or so from now 6/9; both wounds on the left medial and left lateral are smaller. We have been using calcium alginate under compression. She does not have an open wound on the right leg she is using a stocking and her compression pumps things are going well. She has an appointment with Dr. Donzetta Matters with regards to her stent in the left common iliac vein Electronic Signature(s) Signed: 04/13/2020 4:11:49 PM By: Linton Ham MD Entered By: Linton Ham on 04/13/2020 13:09:28 [name] ([id number]) -------------------------------------------------------------------------------- Physical Exam Details Patient Name: CANDELARIA, PIES. Date of Service: 04/13/2020 12:45 PM Medical Record Number: [id number] Patient Account Number: [id number] Date of Birth/Sex: February 09, 1946 (74 y.o. F) Treating RN: Cornell Barman Primary Care Provider: [name] Other  Clinician: Referring Provider: [name] Treating Provider/Extender: Tito Dine in Treatment: 70 Constitutional Sitting or standing Blood Pressure is within target range for patient.. Pulse regular and within target range for patient.Marland Kitchen Respirations regular, non- labored and within target range.. Temperature is normal and within the target range for the patient.Marland Kitchen appears in no distress. Respiratory Respiratory effort is easy and symmetric bilaterally. Rate is normal at rest and on room air.. Cardiovascular Pedal pulses are present.. Dilated veins in the both ankles and lateral  and medial feet.. Edema control is quite good. Integumentary (Hair, Skin) No erythema around the wound. Notes Wound exam; left medial and left lateral. The medial still has some surface slough but it measures smaller. On the lateral side there is some eschar around the circumference although nothing on the wound surface. I did not debride either area. Electronic Signature(s) Signed: 04/13/2020 4:11:49 PM By: Linton Ham MD Entered By: Linton Ham on 04/13/2020 13:10:59 [name], [name] ([id number]) -------------------------------------------------------------------------------- Physician Orders Details Patient Name: REGINE, CHRISTIAN. Date of Service: 04/13/2020 12:45 PM Medical Record Number: [id number] Patient Account Number: [id number] Date of Birth/Sex: 02/10/1946 (74 y.o. 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 Wound #6 Left,Lateral Lower Leg o Topical Lidocaine 4% cream applied to wound bed prior to debridement (In Clinic Only). Skin Barriers/Peri-Wound Care Wound #5 Left,Medial Lower Leg o Triamcinolone Acetonide Ointment  (TCA) - peri-wound Wound #6 Left,Lateral Lower Leg o Triamcinolone Acetonide Ointment (TCA) - peri-wound Primary Wound Dressing Wound #5 Left,Medial Lower Leg o Alginate Wound #6 Left,Lateral Lower Leg o Alginate Secondary Dressing Wound #5 Left,Medial Lower Leg o ABD pad Wound #6 Left,Lateral Lower Leg o ABD pad Dressing Change Frequency Wound #5 Left,Medial Lower Leg o Change dressing every week o Other: - Nurse visit Friday and Monday if needed. Wound #6 Left,Lateral Lower Leg o Change dressing every week o Other: - Nurse visit Friday and Monday if needed. Follow-up Appointments Wound #5 Left,Medial Lower Leg o Return Appointment in 1 week. o Nurse Visit as needed Wound #6 Left,Lateral Lower Leg MAIMUNA, LEAMAN ([id number]) o Return Appointment in 1 week. o Nurse Visit as needed Edema Control Wound #5 Left,Medial Lower Leg o 3 Layer Compression System - Left Lower Extremity o Patient to wear own compression stockings - On Right o Compression Pump: Use compression pump on left lower extremity for 60 minutes, twice daily. o Compression Pump: Use compression pump on right lower extremity for 60 minutes, twice daily. Wound #6 Left,Lateral Lower Leg o 3 Layer Compression System - Left Lower Extremity o Patient to wear own compression stockings - On Right o Compression Pump: Use compression pump on left lower extremity for 60 minutes, twice daily. o Compression Pump: Use compression pump on right lower extremity for 60 minutes, twice daily. Electronic  Signature(s) Signed: 04/13/2020 4:11:49 PM By: Linton Ham MD Signed: 04/15/2020 12:58:54 PM By: [name], [name], RN, CWS, [name], [name] Entered By: [name], [name], RN, CWS, Kim on 04/13/2020 13:05:03 [name], [name] ([id number]) -------------------------------------------------------------------------------- Problem List Details Patient Name: DEVANSHI, CALIFF. Date of Service: 04/13/2020 12:45  PM Medical Record Number: [id number] Patient Account Number: [id number] Date of Birth/Sex: [birth year] (74 y.o. F) Treating RN: Cornell Barman Primary Care Provider: [name] Other Clinician: Referring Provider: [name] Treating Provider/Extender: Tito Dine in Treatment: 43 Active Problems ICD-10 Encounter Code Description Active Date MDM Diagnosis L97.221 Non-pressure chronic ulcer of left calf limited to breakdown of skin 01/07/2019 No Yes I89.0 Lymphedema, not elsewhere classified 12/10/2018 No Yes I87.332 Chronic venous hypertension (idiopathic) with ulcer and inflammation of 12/09/2019 No Yes left lower extremity I83.009 Varicose veins of unspecified lower extremity with ulcer of unspecified 04/13/2020 No Yes site I82.592 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 12/09/2019 No Yes lower extremity Inactive Problems ICD-10 Code Description Active Date Inactive Date L97.818 Non-pressure chronic ulcer of other part of right lower leg with other specified 10/14/2019 10/14/2019 severity Resolved Problems ICD-10 Code Description Active Date Resolved Date L97.211 Non-pressure chronic ulcer of right calf limited to breakdown of skin 02/10/2020 02/10/2020 Electronic Signature(s) Signed: 04/13/2020 4:11:49 PM By: Linton Ham MD Entered By: Linton Ham on 04/13/2020 13:13:31 Burget, [name] ([id number]) -------------------------------------------------------------------------------- Progress Note Details Patient Name: NEEMA, [name]. Date of Service: 04/13/2020 12:45 PM Medical Record Number: [id number] Patient Account Number: [id number] Date of Birth/Sex: 08/15/1946 (74 y.o. F) Treating RN: Cornell Barman Primary Care Provider: [name] Other Clinician: Referring Provider: [name] Treating Provider/Extender: Tito Dine in Treatment: 70 Subjective History of Present Illness (HPI) Pleasant 74 year old with  history of chronic venous insufficiency. No diabetes or peripheral vascular disease. Left ABI 1.29. Questionable history of left lower extremity DVT. She developed a recurrent ulceration on her left lateral calf in December 2015, which she attributes to poor diet and subsequent lower extremity edema. She underwent endovenous laser ablation of her left greater saphenous vein in 2010. She underwent laser ablation of accessory branch of left GSV in April 2016 by Dr. Kellie Simmering at [facility]. She was previously wearing [place] boots, which she tolerated well. Tolerating 2 layer compression and cadexomer iodine. She returns to clinic for follow-up and is without new complaints. She denies any significant pain at this time. She reports persistent pain with pressure. No claudication or ischemic rest pain. No fever or chills. No drainage. READMISSION 11/13/16; this is a 74 year old woman who is not a diabetic. She is here for a review of a painful area on her left medial lower extremity. I note that she was seen here previously last year for wound I believe to be in the same area. At that time she had undergone previously a left greater saphenous vein ablation by Dr. Kellie Simmering and she had a ablation of the anterior accessory branch of the left greater saphenous vein in March 2016. Seeing that the wound actually closed over. In reviewing the history with her today the ulcer in this area has been recurrent. She describes a biopsy of this area in 2009 that only showed stasis physiology. 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 Do not see that she has ever had arterial studies in the left leg. She tells me she has a follow-up with Dr. Kellie Simmering in roughly 10 days In any case she developed the reopening of  this area roughly a month ago. On the background of this she describes rapidly increasing edema which has responded to Lasix 40 mg and metolazone 2.5 mg as well as the patient's lymph massage. She has been told she has both venous insufficiency and lymphedema but she cannot tolerate compression stockings 11/28/16; the patient saw Dr. Kellie Simmering recently. Per the patient he did arterial Dopplers in the office that did not show evidence of arterial insufficiency, per the patient he stated "treat this like an ordinary venous ulcer". She also saw her dermatologist Dr. Ronnald Ramp who felt that this was more of a vascular ulcer. In general things are improving although she arrives today with increasing bilateral lower extremity edema with weeping a deeper fluid through the wound on the left medial leg compatible with some degree of lymphedema 12/04/16; the patient's wound is fully epithelialized but I don't think fully healed. We will do another week of depression with Promogran and TCA however I suspect we'll be able to discharge her next week. This is a very unusual-looking wound which was initially a figure-of-eight type wound lying on its side surrounded by petechial like hemorrhage. She has had venous ablation on this side. She apparently does not have an arterial issue per Dr. Kellie Simmering. She saw her dermatologist thought it was "vascular". Patient is definitely going to need ongoing compression and I talked about this with her today she will go to elastic therapy after she leaves here next week 12/11/16; the patient's wound is not completely closed today. She has surrounding scar tissue and in further discussion with the patient it would appear that she had ulcers in this area in 2009 for a prolonged period of time ultimately requiring a punch biopsy of this area that only showed venous insufficiency. I did not previously pickup on this part of the history from the patient. 12/18/16; the patient's wound is completely  epithelialized. There is no open area here. She has significant bilateral venous insufficiency with secondary lymphedema to a mild-to-moderate degree she does not have compression stockings.. She did not say anything to me when I was in the room, she told our intake nurse that she was still having pain in this area. This isn't unusual recurrent small open area. She is going to go to elastic therapy to obtain compression stockings. 12/25/16; the patient's wound is fully epithelialized. There is no open area here. The patient describes some continued episodic discomfort in this area medial left calf. However everything looks fine and healed here. She is been to elastic therapy and caught herself 15-20 mmHg stockings, they apparently were having trouble getting 20-30 mm stockings in her size 01/22/17; this is a patient we discharged from the clinic a month ago. She has a recurrent open wound on her medial left calf. She had 15 mm support stockings. I told her I thought she needed 20-30 mm compression stockings. She tells me that she has been ill with hospitalization secondary to asthma and is been found to have severe hypokalemia likely secondary to a combination of Lasix and metolazone. This morning she noted blistering and leaking fluid on the posterior part of her left leg. She called our intake nurse urgently and we was saw her this afternoon. She has not had any real discomfort here.  I don't know that she's been wearing any stockings on this leg for at least 2-3 days. ABIs in this clinic were 1.21 on the right and 1.3 on the left. She is previously seen vascular surgery who does not think that there is a peripheral arterial issue. 01/30/17; Patient arrives with no open wound on the left leg. She has been to elastic therapy and obtained 20-28mmhg below knee stockings and she has one on the right leg today. READMISSION 02/19/18; this Gammell is a now 74 year old patient we've had in this clinic perhaps 3  times before. I had last looked at her from January 07 December 2016 with an area on the medial left leg. We discharged her on 12/25/16 however she had to be readmitted on 01/22/17 with a recurrence. I have in my notes that we discharged her on 20-30 mm stockings although she tells me she was only wearing support hose because she cannot get stockings on predominantly related to her cervical spine surgery/issues. She has had previous ablations done by vein and vascular in [place] including a great saphenous vein ablation on the left with an anterior accessory branch ablation I think both of these were in 2016. On one of the previous visit she had a biopsy noted 2009 that was negative. She is not felt to have an arterial issue. She is not a diabetic. She does have a history of obstructive sleep apnea hypertension asthma as well as chronic venous insufficiency and lymphedema. On this occasion she noted 2 dry scaly patch on her left leg. She tried to put lotion on this it didn't really help. There were 2 open areas.the patient has been seeing her primary physician from 02/05/18 through 02/14/18. She had Unna boots applied. The superior wound now on the lateral left leg has closed but she's had one wound that remains open on the lateral left leg. This is not the same spot as we dealt with in 2018. ABIs in this clinic were 1.3 bilaterally MASHAWN, BRAZIL ([id number]) 02/26/18; patient has a small wound on the left lateral calf. Dimensions are down. She has chronic venous insufficiency and lymphedema. 03/05/18; small open area on the left lateral calf. Dimensions are down. Tightly adherent necrotic debris over the surface of the wound which was difficult to remove. Also the dressing [over collagen] stuck to the wound surface. This was removed with some difficulty as well. Change the primary dressing to Hydrofera Blue ready 03/12/18; small open area on the left lateral calf. Comes in with tightly adherent surface  eschar as well as some adherent Hydrofera Blue. 03/19/18; open area on the left lateral calf. Again adherent surface eschar as well as some adherent Hydrofera Blue nonviable subcutaneous tissue. She complained of pain all week even with the reduction from 4-3 layer compression I put on last week. Also she had an increase in her ankle and calf measurements probably related to the same thing. 03/26/18; open area on the left lateral calf. A very small open area remains here. We used silver alginate starting last week as the Hydrofera Blue seem to stick to the wound bed. In using 4-layer compression 04/02/18; the open area in the left lateral calf at some adherent slough which I removed there is no open area here. We are able to transition her into her own compression stocking. Truthfully I think this is probably his support hose. However this does not maintain skin integrity will be limited. She cannot put over the toe compression stockings on because  of neck problems hand problems etc. She is allergic to the lining layer of juxta lites. We might be forced to use extremitease stocking should this fail READMIT 11/24/2018 Patient is now a 74 year old woman who is not a diabetic. She has been in this clinic on at least 3 previous occasions largely with recurrent wounds on her left leg secondary to chronic venous insufficiency with secondary lymphedema. Her situation is complicated by inability to get stockings on and an allergy to neoprene which is apparently a component and at least juxta lites and other stockings. As a result she really has not been wearing any stockings on her legs. She tells [place] that roughly 2 or 3 weeks ago she started noticing a stinging sensation just above her ankle on the left medial aspect. She has been diagnosed with pseudogout and she wondered whether this was what she was experiencing. She tried to dress this with something she bought at the store however subsequently it pulled  skin off and now she has an open wound that is not improving. She has been using Vaseline gauze with a cover bandage. She saw her primary doctor last week who put an Haematologist on her. ABIs in this clinic was 1.03 on the left 2/12; the area is on the left medial ankle. Odd-looking wound with what looks to be surface epithelialization but a multitude of small petechial openings. This clearly not closed yet. We have been using silver alginate under 3 layer compression with TCA 2/19; the wound area did not look quite as good this week. Necrotic debris over the majority of the wound surface which required debridement. She continues to have a multitude of what looked to be small petechial openings. She reminds [place] that she had a biopsy on this initially during her first outbreak in 2015 in [place] dermatology. She expresses concern about this being a possible melanoma. 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 I assured her that this does not look anything like melanoma. Except for the petechial reaction it does look like a venous insufficiency area and she certainly has evidence of this on both sides 2/26; a difficult area on the left medial ankle. The patient clearly has chronic venous hypertension with some degree of lymphedema. The odd thing about the area is the small petechial hemorrhages. I am not really sure how to explain this. This was present last time and this is not a compression injury. We have been using Hydrofera Blue which I changed to last week 3/4; still using Hydrofera Blue. Aggressive debridement today. She does not have known arterial issues. She has seen Dr. Kellie Simmering at [facility] vein and vascular and and has an ablation on the left. [Anterior accessory branch of the greater saphenous]. From what I remember they did not feel she had an arterial issue. The patient has had this area biopsied in  2009 at [facility] dermatology and by her recollection they said this was "stasis". She is also follow-up with dermatology locally who thought that this was more of a vascular issue 3/11; using Hydrofera Blue. Aggressive debridement today. She does not have an arterial issue. We are using 3 layer compression although we may need to go to 4. The patient has been in for multiple changes to her wrap since I last saw her a week ago. She says that the area was leaking. I do not have too much more information on what was  found 01/19/19 on evaluation today patient was actually being seen for a nurse visit when unfortunately she had the area on her left lateral lower extremity as well as weeping from the right lower extremity that became apparent. Therefore we did end up actually seeing her for a full visit with myself. She is having some pain at this site as well but fortunately nothing too significant at this point. No fevers, chills, nausea, or vomiting noted at this time. 3/18-Patient is back to the clinic with the left leg venous leg ulcer, the ulcer is larger in size, has a surface that is densely adherent with fibrinous tissue, the Hydrofera Blue was used but is densely adherent and there was difficulty in removing it. The right lower extremity was also wrapped for weeping edema. Patient has a new area over the left lateral foot above the malleolus that is small and appears to have no debris with intact surrounding skin. Patient is on increased dose of Lasix also as a means to edema management 3/25; the patient has a nonhealing venous ulcer on the medial left leg and last week developed a smaller area on the lateral left calf. We have been using Hydrofera Blue with a contact layer. 4/1; no major change in these wounds areas. Left medial and more recently left lateral calf. I tried Iodoflex last week to aid in debridement she did not tolerate this. She stated her pain was terrible all week. She took the  top layer of the 4 layer compression off. 4/8; the patient actually looks somewhat better in terms of her more prominent left lateral calf wound. There is some healthy looking tissue here. She is still complaining of a lot of discomfort. 4/15; patient in a lot of pain secondary to sciatica. She is on a prednisone taper prescribed by her primary physician. She has the 2 areas one on the left medial and more recently a smaller area on the left lateral calf. Both of these just above the malleoli 4/22; her back pain is better but she still states she is very uncomfortable and now feels she is intolerant to the [employer]. No real change in the wounds we have been using Sorbact. She has been previously intolerant to Iodoflex. There is not a lot of option about what we can use to debride this wound under compression that she no doubt needs. sHe states Ultram no longer works for her pain 4/29; no major change in the wounds slightly increased depth. Surface on the original medial wound perhaps somewhat improved however the more recent area on the lateral left ankle is 100% covered in very adherent debris we have been using Sorbact. She tolerates 4 layer compression well and her edema control is a lot better. She has not had to come in for a nurse check 5/6; no major change in the condition of the wounds. She did consent to debridement today which was done with some difficulty. Continuing Sorbact. She did not tolerate Iodoflex. She was in for a check of her compression the day after we wrapped her last week this was adjusted but nothing much was found 5/13; no major change in the condition or area of the wounds. I was able to get a fairly aggressive debridement done on the lateral left leg wound. Even using Sorbact under compression. She came back in on Friday to have the wrap changed. She says she felt uncomfortable on the lateral aspect of her ankle. She has a long history of chronic venous  insufficiency  including previous ablation surgery on this side. 5/20-Patient returns for wounds on left leg with both wounds covered in slough, with the lateral leg wound larger in size, she has been in 3 layer compression and felt more comfortable, she describes pain in ankle, in leg and pins and needles in foot, and is about to try Pamelor for this 6/3; wounds on the left lateral and left medial leg. The area medially which is the most recent of the 2 seems to have had the largest increase in [name], [name]. ([id number]) dimensions. We have been using Sorbac to try and debride the surface. She has been to see orthopedics they apparently did a plain x-ray that was indeterminant. Diagnosed her with neuropathy and they have ordered an MRI to determine if there is underlying osteomyelitis. This was not high on my thought list but I suppose it is prudent. We have advised her to make an appointment with vein and vascular in [place]. She has a history of a left greater saphenous and accessory vein ablations I wonder if there is anything else that can be done from a surgical point of view to help in these difficult refractory wounds. We have previously healed this wound on one occasion but it keeps on reopening [medial side] 6/10; deep tissue culture I did last week I think on the left medial wound showed both moderate E. coli and moderate staph aureus [MSSA]. She is going to require antibiotics and I have chosen Augmentin. We have been using Sorbact and we have made better looking wound surface on both sides but certainly no improvement in wound area. She was back in last Friday apparently for a dressing changes the wrap was hurting her outer left ankle. She has not managed to get a hold of vein and vascular in [place]. We are going to have to make her that appointment 6/17; patient is tolerating the Augmentin. She had an MRI that I think was ordered by orthopedic surgeon this did not show osteomyelitis or  an abscess did suggest cellulitis. We have been using Sorbact to the lateral and medial ankles. We have been trying to arrange a follow-up appointment with vein and vascular in [place] or did her original ablations. We apparently an area sent the request to vein and vascular in [facility] 6/24; patient has completed the Augmentin. We do not yet have a vein and vascular appointment in [place]. I am not sure what the issue is here we have asked her to call tomorrow. We are using Sorbact. Making some improvements and especially the medial wound. Both surfaces however look better medial and lateral. 7/1; the patient has been in contact with vein and vascular in [place] but has not yet received an appointment. Using Sorbact we have gradually improve the wound surface with no improvement in surface area. She is approved for Apligraf but the wound surface still is not completely viable. She has not had to come in for a dressing change 7/8; the patient has an appointment with vein and vascular on 7/31 which is a Friday afternoon. She is concerned about getting back here for [place] to dress her wounds. I think it is important to have them goal for her venous reflux/history of ablations etc. to see if anything else can be done. She apparently tested positive for 1 of the blood tests with regards to lupus and saw a rheumatologist. He has raised the issue of vasculitis again. I have had this thought in the past however the  evidence seems overwhelming that this is a venous reflux etiology. If the rheumatologist tells me there is clinical and laboratory investigation is positive for lupus I will rethink this. 7/15; the patient's wound surfaces are quite a bit better. The medial area which was her original wound now has no depth although the lateral wound which was the more recent area actually appears larger. Both with viable surfaces which is indeed better. Using Sorbact. I wanted to use Apligraf on her  however there is the issue of the vein and vascular appointment on 7/31 at 2:00 in the afternoon which would not allow her to get back to be rewrapped and they would no doubt remove the graft 7/22; the patient's wound surfaces have moderate amount of debris although generally look better. The lateral one is larger with 2 small satellite areas superiorly. We are waiting for her vein and vascular appointment on 7/31. She has been approved for Apligraf which I would like to use after th 7/29; wound surfaces have improved no debridement is required we have been using Sorbact. She sees vein and vascular on Friday with this so question of whether anything can be done to lessen the likelihood of recurrence and/or speed the healing of these areas. She is already had previous ablations. She no doubt has severe venous hypertension 8/5-Patient returns at 1 week, she was in [place] for 3 days by her podiatrist, we have been using so backed to the wound, she has increased pain in both the wounds on the left lower leg especially the more distal one on the lateral aspect 8/12-Patient returns at 1 week and she is agreeable to having debridement in both wounds on her left leg today. We have been using Sorbact, and vascular studies were reviewed at last visit 8/19; the patient arrives with her wounds fairly clean and no debridement is required. We have used Sorbact which is really done a nice job in cleaning up these very difficult wound surfaces. The patient saw Dr. Donzetta Matters of vascular surgery on 7/31. He did not feel that there was an arterial component. He felt that her treated greater saphenous vein is adequately addressed and that the small saphenous vein did not appear to be involved significantly. She was also noted to have deep venous reflux which is not treatable. Dr. Donzetta Matters mentioned the possibility of a central obstructive component leading to reflux and he offered her central venography. She wanted to discuss  this or think about it. I have urged her to go ahead with this. She has had recurrent difficult wounds in these areas which do heal but after months in the clinic. If there is anything that can be done to reduce the likelihood of this I think it is worth it. 9/2 she is still working towards getting follow-up with Dr. Donzetta Matters to schedule her CT. Things are quite a bit worse venography. I put Apligraf on 2 weeks ago on both wounds on the medial and lateral part of her left lower leg. She arrives in clinic today with 3 superficial additional wounds above the area laterally and one below the wound medially. She describes a lot of discomfort. I think these are probably wrapped injuries. Does not look like she has cellulitis. 07/20/2019 on evaluation today patient appears to be doing somewhat poorly in regard to her lower extremity ulcers. She in fact showed signs of erythema in fact we may even be dealing with an infection at this time. Unfortunately I am unsure if this is  just infection or if indeed there may be some allergic reaction that occurred as a result of the Apligraf application. With that being said that would be unusual but nonetheless not impossible in this patient is one who is unfortunately allergic to quite a bit. Currently we have been using the Sorbact which seems to do as well as anything for her. I do think we may want to obtain a culture today to see if there is anything showing up there that may need to be addressed. 9/16; noted that last week the wounds look worse in 1 week follow-up of the Apligraf. Using Sorbact as of 2 days ago. She arrives with copious amounts of drainage and new skin breakdown on the back of the left calf. The wounds arm more substantial bilaterally. There is a fair amount of swelling in the left calf no overt DVT there is edema present I think in the left greater than right thigh. She is supposed to go on 9/28 for CT venography. The wounds on the medial and lateral  calf are worse and she has new skin breakdown posteriorly at least new for me. This is almost developing into a circumferential wound area The Apligraf was taken off last week which I agree with things are not going in the right direction a culture was done we do not have that back yet. She is on Augmentin that she started 2 days ago 9/23; dressing was changed by her nurses on Monday. In general there is no improvement in the wound areas although the area looks less angry than last week. She did get Augmentin for MSSA cultured on the 14th. She still appears to have too much swelling in the left leg even with 3 layer compression 9/30; the patient underwent her procedure on 9/28 by Dr. Donzetta Matters at vascular and vein specialist. She was discovered to have the common iliac vein measuring 12.2 mm but at the level of L4-L5 measured 3 mm. After stenting it measured 10 mm. 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 Rouleaux flow in the common femoral and femoral vein was observed much improved after stenting. We are using silver alginate to the wounds on the medial and lateral ankle on the left. 4 layer compression 10/7; the patient had fluid swelling around her knee and 4 layer compression. At the advice of vein and vascular this was reduced to 3 layer which she is tolerating better. We have been using silver alginate under 3 layer compression since last Friday 10/14; arrives with the areas on the left ankle looking a lot better. Inflammation in the area also a lot better. She came in for a nurse check on 10/9 10/21; continued nice improvement. Slight improvements in surface area of both the medial and lateral wounds on the left. A lot of the satellite lesions in the weeping erythema around these from stasis dermatitis is resolved. We have been using silver alginate [name], [name] ([id number]) 10/28; general improvement in the entire wound areas although not a lot of change in dimensions the  wound certainly looks better. There is a lot less in terms of venous inflammation. Continue silver alginate this week however look towards Hydrofera Blue next week 11/4; very adherent debris on the medial wound left wound is not as bad. We have been using silver alginate. Change to [facility] today 11/11; very adherent debris on both wound areas. She went to vein and vascular last week and follow-up they put in [employer] on  this today. He says the [facility] was adherent. Wound is definitely not as good as last week. Especially on the left there the satellite lesions look more prominent 11/18; absolutely no better. erythema on lateral aspect with tenderness. 09/30/2019 on evaluation today patient appears to actually be doing better. Dr. Dellia Nims did put her on doxycycline last week which I do believe has helped her at this point. Fortunately there is no signs of active infection at this time. No fevers, chills, nausea, vomiting, or diarrhea. I do believe he may want extend the doxycycline for 7 additional days just to ensure everything does completely cleared up the patient is in agreement with that plan. Otherwise she is going require some sharp debridement today 12/2; patient is completing a 2-week course of doxycycline. I gave her this empirically for inflammation as well as infection when I last saw her 2 weeks ago. All of this seems to be better. She is using silver alginate she has the area on the medial aspect of the larger area laterally and the 2 small satellite regions laterally above the major wound. 12/9; the patient's wound on the left medial and left lateral calf look really quite good. We have been using silver alginate. She saw vein and vascular in follow-up on 10/09/2019. She has had a previous left greater saphenous vein ablation by Dr. Oscar La in 2016. More recently she underwent a left common iliac vein stent by Dr. 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 The  swelling is improved and certainly the wounds have improved. The patient shows [place] today area on the right medial calf there is almost no wound but leaking lymphedema. She says she start this started 3 or 4 days ago. She did not traumatize it. It is not painful. She does not wear compression on that side 12/16; the patient continues to do well laterally. Medially still requiring debridement. The area on the right calf did not materialize to anything and is not currently open. We wrapped this last time. She has support stockings for that leg although I am not sure they are going to provide adequate compression 12/23; the lateral wound looks stable. Medially still requiring debridement for tightly adherent fibrinous debris. We've been using silver alginate. Surface area not any different 12/30; neither wound is any better with regards to surface and the area on the left lateral is larger. I been using silver alginate to the left lateral which look quite good last week and Sorbact to the left medial 11/11/2019. Lateral wound area actually looks better and somewhat smaller. Medial still requires a very aggressive debridement today. We have been using Sorbact on both wound areas 1/13; not much better still adherent debris bilaterally. I been using Sorbact. She has severe venous hypertension. Probably some degree of dermal fibrosis distally. I wonder whether tighter compression might help and I am going to try that today. We also need to work on the bioburden 1/20; using Sorbact. She has severe venous hypertension status post stent placement for pelvic vein compression. We applied gentamicin last time to see if we could reduce bioburden I had some discussion with her today about the use of pentoxifylline. This is occasionally used in this setting for wounds with refractory venous insufficiency. However this interacts with Plavix. She tells me that she was put on this after stent placement for 3 months. She  will call Dr. Claretha Cooper office to discuss 1/27; we are using gentamicin under Sorbact. 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 She has a stent. Wound medially is measuring smaller this week. Laterally measuring slightly larger although she has some satellite lesions superiorly 2/3; gentamicin under Sorbact under 4-layer compression. 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 She has a stent on Plavix. Her wounds are measuring smaller this week. More substantially laterally where there is a satellite lesion superiorly. 2/10; gentamicin under Sorbac. 4-layer compression. Patient communicated with Dr. Donzetta Matters at [facility] in [place]. He is okay with the patient coming off Plavix I will therefore start her on pentoxifylline for a 1 month trial. In general her wounds look better today. I had some concerns about swelling in the left thigh however she measures 61.5 on the right and 63 on the mid thigh which does not suggest there is any difficulty. The patient is not describing any pain. 2/17; gentamicin under Sorbac 4-layer compression. She has been on pentoxifylline for 1 week and complains of loose stool. No nausea she is eating and drinking well 2/24; the patient apparently came in 2 days ago for a nurse visit when her wrap fell down. Both areas look a little worse this week macerated medially and satellite lesions laterally. Change to silver alginate today 3/3; wounds are larger today especially medially. She also has more swelling in her foot lower leg and I even noted some swelling in her posterior thigh which is tender. I wonder about the patency of her stent. Fortuitously she sees Dr. Claretha Cooper group on Friday 3/10; Mrs. Kreiser was seen by [facility] on 3/5. The patient underwent ultrasound. There was no evidence of thrombosis involving the IVC no evidence of thrombosis involving the right common iliac vein there is no evidence of  thrombosis involving the right external iliac vein the left external vein is also patent. The right common iliac vein stent appears patent bilateral common femoral veins are compressible and appear patent. I was concerned about the left common iliac stent however it looks like this is functional. She has some edema in the posterior thigh that was tender she still has that this week. I also note they had trouble finding the pulses in her left foot and booked her for an ABI baseline in 4 weeks. She will follow up in 6 months for repeat IVC duplex. The patient stopped the pentoxifylline because of diarrhea. It does not look like that was being effective in any case. I have advised her to go back on her aspirin 81 mg tablet, vascular it also suggested this 3/17; comes in today with her wound surfaces a lot better. The excoriations from last week considerably better probably secondary to the TCA. We have been using silver alginate 3/24; comes in today with smaller wounds both medially and laterally. Both required debridement. There are 2 small satellite areas superiorly laterally. She also has a very odd bandlike area in the mid calf almost looking like there was a weakness in the wrap in a localized area. I would write this off as being this however anteriorly she has a small raised ballotable area that is very tender almost reminiscent of an abscess but there was no obvious purulent surface to it. 02/04/20 upon evaluation today patient appears to be doing fairly well in regard to her wounds today. Fortunately there is no signs of active infection at this time. No fevers, chills, nausea, vomiting, or diarrhea. She has been tolerating the dressing changes without complication. Fortunately I feel like she is showing signs of improvement although  has been sometime since have seen her. Nonetheless the area of concern that Dr. Dellia Nims had last week where she had possibly an area of the wrap that was we can  allow the leg to bulge appears to be doing significantly better today there is no signs of anything worsening. [name], LINTNER ([id number]) 4/7; the patient's wounds on her medial and lateral left leg continue to contract. We have been using a regular alginate. Last week she developed an area on the right medial lower leg which is probably a venous ulcer as well. 4/14; the wounds on her left medial and lateral lower leg continue to contract. Surface eschar. We have been using regular alginate. The area on the right medial lower leg is closed. We have been putting both legs under 4-layer contraction. The patient went back to see vein and vascular she had arterial studies done which were apparently "quite good" per the patient although I have not read their notes I have never felt she had an arterial issue. 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 This is been longstanding and refractory to exercise, leg elevation and longstanding use of compression wraps in our clinic as well as compression stockings on the times we have been able to get these to heal 4/21; we thought she actually might be close this week however she arrives in clinic with a lot of edema in her upper left calf and into her posterior thigh. This is been an intermittent problem here. She says the wrap fell down but it was replaced with a nurse visit on Monday. We are using calcium alginate to the wounds and the wound sizes there not terribly larger than last week but there is a lot more edema 4/28; again wound edges are smaller on both sides. Her edema is better controlled than last time. She is obtained her compression pumps from [employer] although they have not been to her home to set these up. 5/5; left medial and left lateral both look stable. I am not sure the medial is any smaller. We have been using calcium alginate under 4-layer compression. She had an area on the right  medial. This was eschared today. We have been wrapping this as well. She does not tolerate external compression stockings due to a history of various contact allergies. She has her compression pumps however the representative from the company is coming on her to show her how to use these tomorrow 5/19; patient with severe chronic venous insufficiency secondary to central venous disease. She had a stent placed in her left common iliac vein. She has done better since but still difficult to control wounds. She comes in today with nothing open on the right leg. Her areas on the left medial and left lateral are just about closed. We are using calcium alginate under 4-layer compression. She is using her external compression pumps at home She only has 15-20 support stockings. States she cannot get anything tighter than that on. 03/30/20-Patient returns at 1 week, the wounds on the left leg are both slightly bigger, the last week she was on 3 layer compression which started to slide down. She is starting to use her lymphedema pumps although she stated on 1 day her right ankle started to swell up and she have to stop that day. Unfortunately the open area seem to oscillate between improving to the point of healing and then flaring up all to do with effectiveness of compression or lack of due  to the left leg topography not keeping the compression wraps from rolling down 6/2 patient comes in with a 15/20 mmHg stocking on the right leg. She tells me that she developed a lot of swelling in her ankles she saw orthopedics she was felt to possibly be having a flare of pseudogout versus some other type of arthritis. She was put on steroids for a respiratory issue so that helps with the inflammation. She has not been using the pumps all week. She thinks the left thigh is more swollen than usual and I would agree with that. She has an appointment with Dr. Donzetta Matters 9 days or so from now 6/9; both wounds on the left medial  and left lateral are smaller. We have been using calcium alginate under compression. She does not have an open wound on the right leg she is using a stocking and her compression pumps things are going well. She has an appointment with Dr. Donzetta Matters with regards to her stent in the left common iliac vein Objective Constitutional Sitting or standing Blood Pressure is within target range for patient.. Pulse regular and within target range for patient.Marland Kitchen Respirations regular, non- labored and within target range.. Temperature is normal and within the target range for the patient.Marland Kitchen appears in no distress. Vitals Time Taken: 12:35 PM, Height: 63 in, Weight: 224.7 lbs, BMI: 39.8, Temperature: 98.4 F, Pulse: 83 bpm, Respiratory Rate: 18 breaths/min, Blood Pressure: 140/70 mmHg. Respiratory Respiratory effort is easy and symmetric bilaterally. Rate is normal at rest and on room air.. Cardiovascular Pedal pulses are present.. Dilated veins in the both ankles and lateral and medial feet.. Edema control is quite good. General Notes: Wound exam; left medial and left lateral. The medial still has some surface slough but it measures smaller. On the lateral side there is some eschar around the circumference although nothing on the wound surface. I did not debride either area. Integumentary (Hair, Skin) No erythema around the wound. Wound #5 status is Open. Original cause of wound was Gradually Appeared. The wound is located on the Left,Medial Lower Leg. The wound measures 1.1cm length x 1.1cm width x 0.1cm depth; 0.95cm^2 area and 0.095cm^3 volume. There is Fat Layer (Subcutaneous Tissue) Exposed exposed. There is no tunneling or undermining noted. There is a medium amount of serous drainage noted. The wound margin is flat and intact. There is large (67-100%) pink granulation within the wound bed. There is a small (1-33%) amount of necrotic tissue within the wound bed including Adherent Slough. Wound #6 status is  Open. Original cause of wound was Gradually Appeared. The wound is located on the Left,Lateral Lower Leg. The wound ESTEPHANIE, HUBBS ([id number]) measures 0.8cm length x 1.2cm width x 0.1cm depth; 0.754cm^2 area and 0.075cm^3 volume. There is Fat Layer (Subcutaneous Tissue) Exposed exposed. There is no tunneling or undermining noted. There is a medium amount of serous drainage noted. The wound margin is flat and intact. There is large (67-100%) pink granulation within the wound bed. There is a small (1-33%) amount of necrotic tissue within the wound bed including Eschar and Adherent Slough. 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 Wound #6 Left,Lateral Lower Leg: Topical Lidocaine 4% cream applied to wound bed prior to debridement (In Clinic Only). Skin Barriers/Peri-Wound Care: Wound #5 Left,Medial Lower Leg: Triamcinolone Acetonide Ointment (TCA) - peri-wound Wound #6 Left,Lateral Lower Leg: Triamcinolone Acetonide Ointment (TCA) - peri-wound Primary Wound Dressing: Wound #5 Left,Medial Lower Leg: Alginate Wound #6 Left,Lateral Lower Leg: Alginate Secondary Dressing: Wound #5 Left,Medial Lower Leg: ABD pad Wound #6 Left,Lateral Lower Leg: ABD pad Dressing Change Frequency: Wound #5 Left,Medial Lower Leg: Change dressing every week Other: - Nurse visit Friday and Monday if needed. Wound #6 Left,Lateral  Lower Leg: Change dressing every week Other: - Nurse visit Friday and Monday if needed. Follow-up Appointments: Wound #5 Left,Medial Lower Leg: Return Appointment in 1 week. Nurse Visit as needed Wound #6 Left,Lateral Lower Leg: Return Appointment in 1 week. Nurse Visit as needed Edema Control: Wound #5 Left,Medial Lower Leg: 3 Layer Compression System - Left Lower Extremity Patient to wear own compression stockings - On Right Compression Pump: Use compression pump on left lower extremity for 60 minutes, twice daily. Compression Pump: Use compression pump on right lower extremity for 60 minutes, twice daily. Wound #6 Left,Lateral Lower Leg: 3 Layer Compression System - Left Lower Extremity Patient to wear own compression stockings - On Right Compression Pump: Use compression pump on left lower extremity for 60 minutes, twice daily. Compression Pump: Use compression pump on right lower extremity for 60 minutes, twice daily. BRANDIS, WIXTED ([id number]) 1. Her wounds actually look quite good. I did not debride these today however if they stall she will need debridement of the surface of the medial and the circumference of the lateral next week. 2. She sees Dr. Donzetta [facility]ters [facility] on Friday with regards to her left common iliac stent. We should have his note to review next week. 3. Very close to closing these areas Electronic Signature(s) Signed: 04/13/2020 4:11:49 PM By: Linton Ham MD Entered By: Linton Ham on 04/13/2020 13:12:21 TAMESHIA, [place] ([id number]) -------------------------------------------------------------------------------- SuperBill Details Patient Name: LAMICA, MCCART. Date of Service: 04/13/2020 Medical Record Number: [id number] Patient Account Number: [id number] Date of Birth/Sex: 25-[name]-1946 (74 y.o. 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 I83.009 Varicose veins of unspecified lower extremity with ulcer of unspecified site Facility Procedures CPT4 Code: [id number] Description: (Facility Use Only) [id number] - Rives LWR LT LEG Modifier: Quantity: 1 Physician Procedures CPT4 Code Description: [id number] - WC PHYS LEVEL 3 - EST PT Modifier: Quantity: 1 CPT4 Code Description: ICD-10 Diagnosis Description L97.221 Non-pressure chronic ulcer of left calf limited to breakdown of skin I87.332 Chronic venous hypertension (idiopathic) with ulcer and inflammation of I89.0 Lymphedema, not elsewhere classified Modifier: left lower extremity Quantity: Electronic Signature(s) Signed: 04/13/2020 4:11:49 PM By: Linton Ham MD Entered By: Linton Ham on 04/13/2020 13:12:53

## 2020-04-15 NOTE — Progress Notes (Signed)
Patient ID: [name], female   DOB: Jul 19, 1946, 74 y.o.   MRN: [id number]  Reason for Consult: Follow-up   Referred by Amber Bush, MD  Subjective:     HPI:  [name] is a 74 y.o. female has a history of left greater saphenous vein ablation 2016.  I recently placed left common iliac vein stent and September of last year.  After this swelling had really improved.  She continues to follow Dr. Dellia Nims.  She now has swelling above the compression wrap on the left leg up in the thigh.  She has also developed a small wound on the right leg.  She has never had any venous intervention on the right lower extremity.  Patient has normal arterial studies recently.  Overall she walks without much limitation main concern is just swelling in the left thigh at this time.  She is taking aspirin daily.  Past Medical History:  Diagnosis Date  . Allergy   . Anesthesia complication    trouble waking up 2/2 CPAP  . Arthritis   . Asthma in adult   . Cataract 2019   left eye resolved with surgery  . Cervical spondylosis 2012   Jacelyn Grip, [place])  . Choroidal nevus of left eye 03/22/2016  . Chronic venous insufficiency 2016   severe reflux with painful varicosities sees [facility]  . Clotting disorder ([place])    due to vein ablation   . Complication of anesthesia    difficulty coming out due to sleep apnea per pt  . Difficult intubation    PATIENT DENIES   . Diverticulosis    per ct scans 09-2016 and 01-2017  . DVT (deep venous thrombosis) ([place]) 2011   small, developed after venous ablation  . DVT (deep venous thrombosis) ([place])   . Family history of adverse reaction to anesthesia    O2 levels drop upon waking   . Hearing loss sensory, bilateral 2013   mod-severe high freq sensorineural ([facility])  . Hiatal hernia     09-2016 ct scan [facility] at cancer center   . History of kidney stones 1980s  . History of radiation therapy 07/19/11-08/25/2011   RIGHT AXILLARY REGION/METASTATIC  .  Hypertension   . Melanoma ([place]) 06/29/10   MALIGNANT MELANOMA R SHOULDER/SUPRASCAPULAR BACK s/p interferon chemo and XRT  . Neuromuscular disorder ([facility])    muscle spasms  . OSA (obstructive sleep apnea)    on CPAP  . Pneumonia    2001  . RLS (restless legs syndrome)   . Seasonal allergies   . Sleep apnea    wears cpap  . Tinnitus   . Unspecified vitamin D deficiency 03/18/2013  . Varicose veins of left lower extremity    Family History  Problem Relation Age of Onset  . Cancer Mother 57       uterine  . CAD Father 55       MI  . Hypertension Father 85  . Alzheimer's disease Father 37  . Cancer Cousin        x2, breast  . Diabetes Sister   . Cancer Paternal Grandmother        melanoma, possibly  . Colon cancer Neg Hx   . Colon polyps Neg Hx   . Rectal cancer Neg Hx   . Stomach cancer Neg Hx    Past Surgical History:  Procedure Laterality Date  . ABI  2016   WNL  . ANTERIOR CERVICAL DECOMP/DISCECTOMY FUSION N/A 08/22/2015   ANTERIOR  CERVICAL DECOMPRESSION FUSION C3/4 - interbody graft and anterior plate; Amber Miss, MD  . ANTERIOR CERVICAL DECOMP/DISCECTOMY FUSION N/A 07/02/2016   Procedure: Cervical four- five Cervical five- six Cervical six- seven Anterior cervical decompression/diskectomy/fusion;  Surgeon: Amber Miss, MD;  Location: [facility];  Service: Neurosurgery;  Laterality: N/A;  C4-5 C5-6 C6-7 Anterior cervical decompression/diskectomy/fusion  . BREAST BIOPSY Left 2013   BENIGN  . BREAST SURGERY Right 1990   BX   . CARDIOVASCULAR STRESS TEST  2010   normal stress test, EF 66%  . CATARACT EXTRACTION W/ INTRAOCULAR LENS IMPLANT Left 2019   Dr. Kathrin Penner  . COLONOSCOPY  03/2005   benign polyp, int hem, rpt 5 yrs ([place], [place])  . COLONOSCOPY WITH PROPOFOL N/A 01/06/2018   diverticulosis, decreased sphincter tone, no f/u needed [name], Kirke Corin, MD)  . DEEP AXILLARY SENTINEL NODE BIOPSY / EXCISION  2012   RIGHT  . DEXA  06/2016   WNL  .  DILATION AND CURETTAGE OF UTERUS  2010  . ENDOVENOUS ABLATION SAPHENOUS VEIN W/ LASER Left 2010   [facility]  . ENDOVENOUS ABLATION SAPHENOUS VEIN W/ LASER Left 2016   accessory branch [facility] [name])  . ESOPHAGOGASTRODUODENOSCOPY  2006   mod chronic carditis, no H pylori ([place], [place])  . INTRAVASCULAR ULTRASOUND/IVUS N/A 08/03/2019   [name], [place]   left  . LOWER EXTREMITY VENOGRAPHY Left 08/03/2019   Amber Sandy, MD  . LUMBAR LAMINECTOMY  2001   L4/5  . MELANOMA EXCISION  2011   Right shoulder, with sentinel lymph node biopsy  . PERIPHERAL VASCULAR INTERVENTION Left 08/03/2019   Stent of left common iliac vein with 14 x 60 mm Vici Donzetta Matters, [place] Dom, MD)  . PORT-A-CATH REMOVAL  12/05/2011   Procedure: REMOVAL PORT-A-CATH;  Surgeon: Amber Bookbinder, MD;  Location: [facility];  Service: General;  Laterality: N/A;  left port removal  . PORTACATH PLACEMENT     left subclavian  . UPPER GASTROINTESTINAL ENDOSCOPY      Short Social History:  Social History   Tobacco Use  . Smoking status: Former Smoker    Types: Cigarettes    Quit date: 11/05/1965    Years since quitting: 54.4  . Smokeless tobacco: Never Used  . Tobacco comment: socially as a teen  Substance Use Topics  . Alcohol use: No    Alcohol/week: 0.0 standard drinks    Allergies  Allergen Reactions  . Sulfa Antibiotics Itching, Swelling and Shortness Of Breath    Facial swelling  . Voltaren [Diclofenac Sodium] Shortness Of Breath  . Latex Other (See Comments)    Blisters ONLY HAD REACTION TO TAPE  (??? ONLY ADHESIVE ALLERGY)  . Tape Other (See Comments)    Caused blisters - must use paper tape - same reaction from NeoPrene  . Doxycycline Other (See Comments)    Diaphoresis and dizziness  . Other     Neoprene    Current Outpatient Medications  Medication Sig Dispense Refill  . acetaminophen (TYLENOL ARTHRITIS PAIN) 650 MG CR tablet Take  1,300 mg by mouth every 8 (eight) hours as needed for pain.     Marland Kitchen albuterol (VENTOLIN HFA) 108 (90 Base) MCG/ACT inhaler INHALE 2 PUFFS BY MOUTH EVERY 6 HORUS AS NEEDED FOR WHEEZING/SHORTNESS OF BREATH 8.5 g 3  . aspirin EC 81 MG tablet Take 81 mg by mouth at bedtime. Melanoma prevention    . famotidine (PEPCID) 20 MG  tablet Take 1 tablet (20 mg total) by mouth at bedtime.    . furosemide (LASIX) 40 MG tablet TAKE 1 TABLET BY MOUTH EVERY DAY 30 tablet 5  . loratadine (CLARITIN) 10 MG tablet Take 10 mg by mouth daily.    Marland Kitchen losartan (COZAAR) 25 MG tablet TAKE 2 TABLETS BY MOUTH EVERY DAY 60 tablet 5  . meloxicam (MOBIC) 15 MG tablet TAKE 1 TABLET BY MOUTH EVERY DAY AS NEEDED FOR PAIN 30 tablet 1  . montelukast (SINGULAIR) 10 MG tablet TAKE 1 TABLET BY MOUTH EVERY DAY 30 tablet 3  . Multiple Vitamin (MULTIVITAMIN WITH MINERALS) TABS tablet Take 1 tablet by mouth daily.    . Polyvinyl Alcohol-Povidone (TEARS PLUS OP) Place 1 drop into both eyes daily as needed (dry eyes/ redness/ burning).    . potassium chloride SA (KLOR-CON) 20 MEQ tablet Take 1 tablet (20 mEq total) by mouth 2 (two) times daily. 180 tablet 3  . PRESCRIPTION MEDICATION CPAP    . Probiotic Product (PROBIOTIC DAILY PO) Take 1 tablet by mouth daily.     Marland Kitchen triamcinolone (NASACORT ALLERGY 24HR) 55 MCG/ACT AERO nasal inhaler Place 2 sprays into the nose daily. In each nostril    . UNABLE TO FIND Take by mouth.    . Vitamin D, Ergocalciferol, (DRISDOL) 1.25 MG (50000 UNIT) CAPS capsule TAKE 1 CAPSULE (50,000 UNITS TOTAL) BY MOUTH EVERY 7 (SEVEN) DAYS. 12 capsule 3   No current facility-administered medications for this visit.    Review of Systems  Constitutional:  Constitutional negative. HENT: HENT negative.  Eyes: Eyes negative.  Cardiovascular: Positive for leg swelling.  GI: Gastrointestinal negative.  Musculoskeletal: Musculoskeletal negative.  Skin: Positive for wound.  Neurological: Neurological negative. Hematologic:  Hematologic/lymphatic negative.  Psychiatric: Psychiatric negative.        Objective:  Objective   Vitals:   04/15/20 0933  BP: (!) 144/87  Pulse: 77  Resp: 20  Temp: 97.7 F (36.5 C)  SpO2: 96%  Weight: 225 lb (102.1 kg)  Height: 5\' 3"  (1.6 m)   Body mass index is 39.86 kg/m.  Physical Exam HENT:     Head: Normocephalic.     Nose:     Comments: Mask in place Eyes:     Pupils: Pupils are equal, round, and reactive to light.  Cardiovascular:     Pulses: Normal pulses.  Pulmonary:     Effort: Pulmonary effort is normal.  Abdominal:     General: Abdomen is flat.     Palpations: Abdomen is soft.  Musculoskeletal:     Cervical back: Normal range of motion and neck supple.     Right lower leg: Edema present.     Left lower leg: Edema present.  Skin:    Capillary Refill: Capillary refill takes less than 2 seconds.     Comments: There is small ulceration on the right medial ankle associated with mild swelling Ulceration on the left lateral ankle and posterior calf are healing well there is no swelling given compression wrap just removed Swelling above the thigh compartments are all soft appear similar sized to the right side  Neurological:     General: No focal deficit present.     Mental Status: She is alert.  Psychiatric:        Mood and Affect: Mood normal.        Behavior: Behavior normal.        Thought Content: Thought content normal.        Judgment:  Judgment normal.     Data: I have independently interpreted her IVC iliac vein study.  Left vein stent appears patent.     Assessment/Plan:     74 year old female with left greater saphenous vein ablation in the past and recent left common iliac vein stent.  Overall her wounds are stable if not better but she was having swelling of the left thigh which was concerning to her.  Her wound is healed we need to get her in thigh-high compression.  She will follow-up in 4 months with bilateral lower extremity reflux  studies given new ulceration on the right although this does appear to be healing as well.  She previously had some perforator veins on the left as well possibly would have a small saphenous vein for treatment as she has ongoing varicosities on the left we will evaluate these in 4 months.     Amber Sandy MD [facility]

## 2020-04-15 NOTE — Progress Notes (Signed)
[name] ([id number]) Visit Report for 04/13/2020 Arrival Information Details Patient Name: [name], [name]. Date of Service: 04/13/2020 12:45 PM Medical Record Number: [id number] Patient Account Number: [id number] Date of Birth/Sex: [birth year] (74 y.o. F) Treating RN: Cornell Barman Primary Care Catori Panozzo: [name] Other Clinician: Referring Cordon Gassett: [name] Treating Ronell Duffus/Extender: Tito Dine in Treatment: 45 Visit Information History Since Last Visit Added or deleted any medications: No Patient Arrived: Ambulatory Any new allergies or adverse reactions: No Arrival Time: 12:35 Had a fall or experienced change in No Accompanied By: self activities of daily living that may affect Transfer Assistance: None risk of falls: Patient Identification Verified: Yes Signs or symptoms of abuse/neglect since last visito No Secondary Verification Process Completed: Yes Hospitalized since last visit: No Patient Requires Transmission-Based No Implantable device outside of the clinic excluding No Precautions: cellular tissue based products placed in the center Patient Has Alerts: Yes since last visit: Patient Alerts: Patient on Blood Has Dressing in Place as Prescribed: Yes Thinner Has Compression in Place as Prescribed: Yes aspirin 81 Pain Present Now: No Electronic Signature(s) Signed: 04/14/2020 11:39:41 AM By: Lorine Bears RCP, RRT, CHT Entered By: Lorine Bears on 04/13/2020 12:36:22 [name] ([id number]) -------------------------------------------------------------------------------- Encounter Discharge Information Details Patient Name: [name]. Date of Service: 04/13/2020 12:45 PM Medical Record Number: [id number] Patient Account Number: [id number] Date of Birth/Sex: [birth year] (74 y.o. F) Treating RN: Cornell Barman Primary Care Koleen Celia: [name] Other Clinician: Referring Danni Leabo: [name] Treating Dianah Pruett/Extender: Tito Dine in Treatment: 30 Encounter Discharge Information Items Discharge Condition: Stable Ambulatory Status: Ambulatory Discharge Destination: Home Transportation: Private Auto Accompanied By: self Schedule Follow-up Appointment: Yes Clinical Summary of Care: Electronic Signature(s) Signed: 04/15/2020 12:58:54 PM By: [name], [name], RN, CWS, [name], [name] Entered By: [name], [name], RN, CWS, Kim on 04/13/2020 13:06:27 [name] ([id number]) -------------------------------------------------------------------------------- Lower Extremity Assessment Details Patient Name: [name]. Date of Service: 04/13/2020 12:45 PM Medical Record Number: [id number] Patient Account Number: [id number] Date of Birth/Sex: 1946/02/16 (74 y.o. F) Treating RN: Montey Hora Primary Care Tasharra Nodine: [name] Other Clinician: Referring Sir Mallis: [name] Treating Ky Moskowitz/Extender: Tito Dine in Treatment: 70 Edema Assessment Assessed: [Left: No] [Right: No] Edema: [Left: Ye] [Right: s] Calf Left: Right: Point of Measurement: 33 cm From Medial Instep 43.5 cm cm Ankle Left: Right: Point of Measurement: 10 cm From Medial Instep 23 cm cm Vascular Assessment Pulses: Dorsalis Pedis Palpable: [Left:Yes] Electronic Signature(s) Signed: 04/13/2020 4:13:29 PM By: Montey Hora Entered By: Montey Hora on 04/13/2020 12:49:53 Zayas, [name] ([id number]) -------------------------------------------------------------------------------- Multi Wound Chart Details Patient Name: [name]. Date of Service: 04/13/2020 12:45 PM Medical Record Number: [id number] Patient Account Number: [id number] Date of Birth/Sex: 1946/03/30 (74 y.o. F) Treating RN: Cornell Barman Primary Care Kail Fraley: [name] Other Clinician: Referring Mariyanna Mucha: [name] Treating Mccormick Macon/Extender: Tito Dine in Treatment: 70 Vital  Signs Height(in): 62 Pulse(bpm): 83 Weight(lbs): 224.7 Blood Pressure(mmHg): 140/70 Body Mass Index(BMI): 40 Temperature(F): 98.4 Respiratory Rate(breaths/min): 18 Photos: [N/A:N/A] Wound Location: Left, Medial Lower Leg Left, Lateral Lower Leg N/A Wounding Event: Gradually Appeared Gradually Appeared N/A Primary Etiology: Lymphedema Venous Leg Ulcer N/A Comorbid History: Cataracts, Asthma, Sleep Apnea, Cataracts, Asthma, Sleep Apnea, N/A Deep Vein Thrombosis, Deep Vein Thrombosis, Hypertension, Peripheral Venous Hypertension, Peripheral Venous Disease, Osteoarthritis, Received Disease, Osteoarthritis, Received Chemotherapy, Received Radiation Chemotherapy, Received Radiation Date Acquired: 11/19/2018 01/19/2019 N/A Weeks of Treatment: 70 64 N/A Wound Status: Open Open N/A Measurements  L x W x D (cm) 1.1x1.1x0.1 0.8x1.2x0.1 N/A Area (cm) : 0.95 0.754 N/A Volume (cm) : 0.095 0.075 N/A % Reduction in Area: 88.90% -242.70% N/A % Reduction in Volume: 88.90% -240.90% N/A Classification: Full Thickness Without Exposed Full Thickness Without Exposed N/A Support Structures Support Structures Exudate Amount: Medium Medium N/A Exudate Type: Serous Serous N/A Exudate Color: amber amber N/A Wound Margin: Flat and Intact Flat and Intact N/A Granulation Amount: Large (67-100%) Large (67-100%) N/A Granulation Quality: Pink Pink N/A Necrotic Amount: Small (1-33%) Small (1-33%) N/A Necrotic Tissue: Adherent [place], Adherent Slough N/A Exposed Structures: Fat Layer (Subcutaneous Tissue) Fat Layer (Subcutaneous Tissue) N/A Exposed: Yes Exposed: Yes Fascia: No Fascia: No Tendon: No Tendon: No Muscle: No Muscle: No Joint: No Joint: No Bone: No Bone: No Epithelialization: Small (1-33%) Small (1-33%) N/A Treatment Notes Wound #5 (Left, Medial Lower Leg) Notes Left: alginate, ABD, 4 layer TCA, Right compression sock Sethi, Jhade J. ([id number]) Wound #6 (Left, Lateral Lower  Leg) Notes Left: alginate, ABD, 4 layer TCA, Right compression sock Electronic Signature(s) Signed: 04/13/2020 4:11:49 PM By: Linton Ham MD Entered By: Linton Ham on 04/13/2020 13:08:35 [name] ([id number]) -------------------------------------------------------------------------------- Multi-Disciplinary Care Plan Details Patient Name: KAELAN, EMAMI. Date of Service: 04/13/2020 12:45 PM Medical Record Number: [id number] Patient Account Number: [id number] Date of Birth/Sex: [birth year] (74 y.o. F) Treating RN: Cornell Barman Primary Care Chameka Mcmullen: [name] Other Clinician: Referring Kaydan Wilhoite: [name] Treating Beverly Suriano/Extender: Tito Dine in Treatment: 36 Active Inactive Medication Nursing Diagnoses: Knowledge deficit related to medication safety: actual or potential Goals: Patient/caregiver will demonstrate understanding of new oral/IV medications prescribed at the [facility] (topical prescriptions are covered under the skin breakdown problem) Date Initiated: 12/16/2019 Target Resolution Date: 01/13/2020 Goal Status: Active Interventions: Assess for medication contraindications each visit where new medications are prescribed Treatment Activities: New medication prescribed at [facility] : 12/16/2019 Notes: Soft Tissue Infection Nursing Diagnoses: Impaired tissue integrity Goals: Patient's soft tissue infection will resolve Date Initiated: 12/10/2018 Target Resolution Date: 01/09/2019 Goal Status: Active Interventions: Assess signs and symptoms of infection every visit Notes: Venous Leg Ulcer Nursing Diagnoses: Actual venous Insuffiency (use after diagnosis is confirmed) Goals: Patient will maintain optimal edema control Date Initiated: 12/10/2018 Target Resolution Date: 01/09/2019 Goal Status: Active Interventions: Assess peripheral edema status every visit. Treatment Activities: Therapeutic compression applied :  12/10/2018 Notes: Wound/Skin Impairment Nursing Diagnoses: MAVIS, GRAVELLE ([id number]) Impaired tissue integrity Goals: Patient/caregiver will verbalize understanding of skin care regimen Date Initiated: 12/10/2018 Target Resolution Date: 01/09/2019 Goal Status: Active Interventions: Assess ulceration(s) every visit Treatment Activities: Topical wound management initiated : 12/10/2018 Notes: Electronic Signature(s) Signed: 04/15/2020 12:58:54 PM By: [name], [name], RN, CWS, [name], [name] Entered By: [name], [name], RN, CWS, Kim on 04/13/2020 13:03:31 REIDA, HEM ([id number]) -------------------------------------------------------------------------------- Pain Assessment Details Patient Name: CHANEE, HENRICKSON. Date of Service: 04/13/2020 12:45 PM Medical Record Number: [id number] Patient Account Number: [id number] Date of Birth/Sex: Jul 31, 1946 (74 y.o. F) Treating RN: Montey Hora Primary Care Korbin Notaro: [name] Other Clinician: Referring Tana Trefry: [name] Treating Kylinn Shropshire/Extender: Tito Dine in Treatment: 70 Active Problems Location of Pain Severity and Description of Pain Patient Has Paino No Site Locations Pain Management and Medication Current Pain Management: Electronic Signature(s) Signed: 04/13/2020 4:13:29 PM By: Montey Hora Entered By: Montey Hora on 04/13/2020 12:44:24 Strine, [name] ([id number]) -------------------------------------------------------------------------------- Patient/Caregiver Education Details Patient Name: DRUANNE, BOSQUES. Date of Service: 04/13/2020 12:45 PM Medical Record Number: [id number] Patient Account Number: [id number] Date of  Birth/Gender: [birth year] (74 y.o. F) Treating RN: Cornell Barman Primary Care Physician: [name] Other Clinician: Referring Physician: [name] Treating Physician/Extender: Tito Dine in Treatment: 65 Education Assessment Education Provided  To: Patient Education Topics Provided Wound/Skin Impairment: Handouts: Caring for Your Ulcer Methods: Demonstration, Explain/Verbal Responses: State content correctly Electronic Signature(s) Signed: 04/15/2020 12:58:54 PM By: [name], [name], RN, CWS, [name], [name] Entered By: [name], [name], RN, CWS, Kim on 04/13/2020 13:05:36 [name] ([id number]) -------------------------------------------------------------------------------- Wound Assessment Details Patient Name: ALITZA, COWMAN. Date of Service: 04/13/2020 12:45 PM Medical Record Number: [id number] Patient Account Number: [id number] Date of Birth/Sex: 10-24-1946 (74 y.o. F) Treating RN: Montey Hora Primary Care Misako Roeder: [name] Other Clinician: Referring Blessed Girdner: [name] Treating Nihira Puello/Extender: Tito Dine in Treatment: 77 Wound Status Wound Number: 5 Primary Lymphedema Etiology: Wound Location: Left, Medial Lower Leg Wound Open Wounding Event: Gradually Appeared Status: Date Acquired: 11/19/2018 Comorbid Cataracts, Asthma, Sleep Apnea, Deep Vein Thrombosis, Weeks Of Treatment: 70 History: Hypertension, Peripheral Venous Disease, Osteoarthritis, Clustered Wound: No Received Chemotherapy, Received Radiation Photos Wound Measurements Length: (cm) 1.1 Width: (cm) 1.1 Depth: (cm) 0.1 Area: (cm) 0.95 Volume: (cm) 0.095 % Reduction in Area: 88.9% % Reduction in Volume: 88.9% Epithelialization: Small (1-33%) Tunneling: No Undermining: No Wound Description Classification: Full Thickness Without Exposed Support Structu Wound Margin: Flat and Intact Exudate Amount: Medium Exudate Type: Serous Exudate Color: amber res Foul Odor After Cleansing: No Slough/Fibrino Yes Wound Bed Granulation Amount: Large (67-100%) Exposed Structure Granulation Quality: Pink Fascia Exposed: No Necrotic Amount: Small (1-33%) Fat Layer (Subcutaneous Tissue) Exposed: Yes Necrotic Quality: Adherent  Slough Tendon Exposed: No Muscle Exposed: No Joint Exposed: No Bone Exposed: No Treatment Notes Wound #5 (Left, Medial Lower Leg) Notes Left: alginate, ABD, 4 layer TCA, Right compression sock Electronic Signature(s) FRANCYNE, ARREAGA ([id number]) Signed: 04/13/2020 4:13:29 PM By: Montey Hora Entered By: Montey Hora on 04/13/2020 12:54:57 Lauture, [name] ([id number]) -------------------------------------------------------------------------------- Wound Assessment Details Patient Name: KAIYLA, STAHLY. Date of Service: 04/13/2020 12:45 PM Medical Record Number: [id number] Patient Account Number: [id number] Date of Birth/Sex: Mar 15, 1946 (74 y.o. F) Treating RN: Montey Hora Primary Care Zyheir Daft: [name] Other Clinician: Referring Charnay Nazario: [name] Treating Dillin Lofgren/Extender: Tito Dine in Treatment: 64 Wound Status Wound Number: 6 Primary Venous Leg Ulcer Etiology: Wound Location: Left, Lateral Lower Leg Wound Open Wounding Event: Gradually Appeared Status: Date Acquired: 01/19/2019 Comorbid Cataracts, Asthma, Sleep Apnea, Deep Vein Thrombosis, Weeks Of Treatment: 64 History: Hypertension, Peripheral Venous Disease, Osteoarthritis, Clustered Wound: No Received Chemotherapy, Received Radiation Photos Wound Measurements Length: (cm) 0.8 Width: (cm) 1.2 Depth: (cm) 0.1 Area: (cm) 0.754 Volume: (cm) 0.075 % Reduction in Area: -242.7% % Reduction in Volume: -240.9% Epithelialization: Small (1-33%) Tunneling: No Undermining: No Wound Description Classification: Full Thickness Without Exposed Support Structu Wound Margin: Flat and Intact Exudate Amount: Medium Exudate Type: Serous Exudate Color: amber res Foul Odor After Cleansing: No Slough/Fibrino Yes Wound Bed Granulation Amount: Large (67-100%) Exposed Structure Granulation Quality: Pink Fascia Exposed: No Necrotic Amount: Small (1-33%) Fat Layer (Subcutaneous Tissue)  Exposed: Yes Necrotic Quality: Eschar, Adherent Slough Tendon Exposed: No Muscle Exposed: No Joint Exposed: No Bone Exposed: No Treatment Notes Wound #6 (Left, Lateral Lower Leg) Notes Left: alginate, ABD, 4 layer TCA, Right compression sock Electronic Signature(s) ANTONIETTA, LANSDOWNE ([id number]) Signed: 04/13/2020 4:13:29 PM By: Montey Hora Entered By: Montey Hora on 04/13/2020 12:55:29 Ting, [name] ([id number]) -------------------------------------------------------------------------------- Vitals Details Patient Name: HAELEY, FORDHAM. Date of Service: 04/13/2020  12:45 PM Medical Record Number: [id number] Patient Account Number: [id number] Date of Birth/Sex: Dec 21, 1945 (74 y.o. F) Treating RN: Cornell Barman Primary Care Ellieana Dolecki: [name] Other Clinician: Referring Lilyana Lippman: [name] Treating Johannah Rozas/Extender: Tito Dine in Treatment: 70 Vital Signs Time Taken: 12:35 Temperature (F): 98.4 Height (in): 63 Pulse (bpm): 83 Weight (lbs): 224.7 Respiratory Rate (breaths/min): 18 Body Mass Index (BMI): 39.8 Blood Pressure (mmHg): 140/70 Reference Range: 80 - 120 mg / dl Electronic Signature(s) Signed: 04/14/2020 11:39:41 AM By: Lorine Bears RCP, RRT, CHT Entered By: Lorine Bears on 04/13/2020 12:38:35

## 2020-04-18 NOTE — Progress Notes (Signed)
Amber, [name] ([id number]) Visit Report for 04/15/2020 Arrival Information Details Patient Name: [name], [name]. Date of Service: 04/15/2020 3:15 PM Medical Record Number: [id number] Patient Account Number: [id number] Date of Birth/Sex: August 21, 1946 (74 y.o. F) Treating RN: Army Melia Primary Care Donevin Sainsbury: [name] Other Clinician: Referring Kambrea Carrasco: [name] Treating Aloni Chuang/Extender: Melburn Hake, HOYT Weeks in Treatment: 106 Visit Information History Since Last Visit Added or deleted any medications: No Patient Arrived: Ambulatory Any new allergies or adverse reactions: No Arrival Time: 15:16 Had a fall or experienced change in No Accompanied By: self activities of daily living that may affect Transfer Assistance: None risk of falls: Patient Identification Verified: Yes Signs or symptoms of abuse/neglect since last visito No Patient Requires Transmission-Based No Hospitalized since last visit: No Precautions: Has Dressing in Place as Prescribed: Yes Patient Has Alerts: Yes Pain Present Now: No Patient Alerts: Patient on Blood Thinner aspirin 81 Electronic Signature(s) Signed: 04/15/2020 3:16:46 PM By: Army Melia Entered By: Army Melia on 04/15/2020 15:16:46 Mans, [name] ([id number]) -------------------------------------------------------------------------------- Compression Therapy Details Patient Name: Amber, [name] Date of Service: 04/15/2020 3:15 PM Medical Record Number: [id number] Patient Account Number: [id number] Date of Birth/Sex: 04/11/1946 (74 y.o. F) Treating RN: Army Melia Primary Care Dillon Livermore: [name] Other Clinician: Referring Myquan Schaumburg: [name] Treating Hyacinth Marcelli/Extender: STONE III, HOYT Weeks in Treatment: 70 Compression Therapy Performed for Wound Assessment: Wound #5 Left,Medial Lower Leg Performed By: Clinician Army Melia, RN Compression Type: Four Layer Electronic Signature(s) Signed: 04/15/2020  3:17:20 PM By: Army Melia Entered By: Army Melia on 04/15/2020 15:17:20 Riffel, [name] ([id number]) -------------------------------------------------------------------------------- Compression Therapy Details Patient Name: Amber, [name]. Date of Service: 04/15/2020 3:15 PM Medical Record Number: [id number] Patient Account Number: [id number] Date of Birth/Sex: Mar 26, 1946 (74 y.o. F) Treating RN: Army Melia Primary Care Addaline Peplinski: [name] Other Clinician: Referring Sativa Gelles: [name] Treating Shirlyn Savin/Extender: STONE III, HOYT Weeks in Treatment: 70 Compression Therapy Performed for Wound Assessment: Wound #6 Left,Lateral Lower Leg Performed By: Clinician Army Melia, RN Compression Type: Four Layer Electronic Signature(s) Signed: 04/15/2020 3:17:21 PM By: Army Melia Entered By: Army Melia on 04/15/2020 15:17:20 Caslin, [name] ([id number]) -------------------------------------------------------------------------------- Encounter Discharge Information Details Patient Name: Amber, [name]. Date of Service: 04/15/2020 3:15 PM Medical Record Number: [id number] Patient Account Number: [id number] Date of Birth/Sex: [name] 16, 1946 (74 y.o. F) Treating RN: Army Melia Primary Care Analee Montee: [name] Other Clinician: Referring Deriana Vanderhoef: [name] Treating Jcion Buddenhagen/Extender: Melburn Hake, HOYT Weeks in Treatment: 55 Encounter Discharge Information Items Discharge Condition: Stable Ambulatory Status: Ambulatory Discharge Destination: Home Transportation: Private Auto Accompanied By: self Schedule Follow-up Appointment: Yes Clinical Summary of Care: Electronic Signature(s) Signed: 04/15/2020 3:18:14 PM By: Army Melia Entered By: Army Melia on 04/15/2020 15:18:13 Faiola, [name] ([id number]) -------------------------------------------------------------------------------- Wound Assessment Details Patient Name: Amber, [name] Date of Service:  04/15/2020 3:15 PM Medical Record Number: [id number] Patient Account Number: [id number] Date of Birth/Sex: 01-30-1946 (74 y.o. F) Treating RN: Army Melia Primary Care Asja Frommer: [name] Other Clinician: Referring Kalil Woessner: [name] Treating Altovise Wahler/Extender: STONE III, HOYT Weeks in Treatment: 70 Wound Status Wound Number: 5 Primary Etiology: Lymphedema Wound Location: Left, Medial Lower Leg Wound Status: Open Wounding Event: Gradually Appeared Date Acquired: 11/19/2018 Weeks Of Treatment: 70 Clustered Wound: No Wound Measurements Length: (cm) 1.1 Width: (cm) 1.1 Depth: (cm) 0.1 Area: (cm) 0.95 Volume: (cm) 0.095 % Reduction in Area: 88.9% % Reduction in Volume: 88.9% Wound Description Classification: Full Thickness Without Exposed Support Structure s Treatment Notes Wound #5 (Left, Medial Lower Leg)  Notes alginate, ABD, TCA, 4 layer Electronic Signature(s) Signed: 04/15/2020 3:19:07 PM By: Army Melia Entered By: Army Melia on 04/15/2020 15:17:05 Quebedeaux, [name] ([id number]) -------------------------------------------------------------------------------- Wound Assessment Details Patient Name: Amber, [name]. Date of Service: 04/15/2020 3:15 PM Medical Record Number: [id number] Patient Account Number: [id number] Date of Birth/Sex: Jun 29, 1946 (74 y.o. F) Treating RN: Army Melia Primary Care Birdie Fetty: [name] Other Clinician: Referring Erminie Foulks: [name] Treating Candelario [name]/Extender: STONE III, HOYT Weeks in Treatment: 70 Wound Status Wound Number: 6 Primary Etiology: Venous Leg Ulcer Wound Location: Left, Lateral Lower Leg Wound Status: Open Wounding Event: Gradually Appeared Date Acquired: 01/19/2019 Weeks Of Treatment: 64 Clustered Wound: No Wound Measurements Length: (cm) 0.8 Width: (cm) 1.2 Depth: (cm) 0.1 Area: (cm) 0.754 Volume: (cm) 0.075 % Reduction in Area: -242.7% % Reduction in Volume: -240.9% Wound  Description Classification: Full Thickness Without Exposed Support Structure s Treatment Notes Wound #6 (Left, Lateral Lower Leg) Notes alginate, ABD, TCA, 4 layer Electronic Signature(s) Signed: 04/15/2020 3:19:07 PM By: Army Melia Entered By: Army Melia on 04/15/2020 15:17:05

## 2020-04-19 ENCOUNTER — Other Ambulatory Visit: Payer: Self-pay | Admitting: *Deleted

## 2020-04-19 DIAGNOSIS — I83029 Varicose veins of left lower extremity with ulcer of unspecified site: Secondary | ICD-10-CM

## 2020-04-19 DIAGNOSIS — L97929 Non-pressure chronic ulcer of unspecified part of left lower leg with unspecified severity: Secondary | ICD-10-CM

## 2020-04-19 DIAGNOSIS — I872 Venous insufficiency (chronic) (peripheral): Secondary | ICD-10-CM

## 2020-04-20 ENCOUNTER — Encounter: Payer: Medicare Other | Admitting: Internal Medicine

## 2020-04-20 ENCOUNTER — Other Ambulatory Visit: Payer: Self-pay

## 2020-04-20 DIAGNOSIS — L97221 Non-pressure chronic ulcer of left calf limited to breakdown of skin: Secondary | ICD-10-CM | POA: Diagnosis not present

## 2020-04-20 DIAGNOSIS — I890 Lymphedema, not elsewhere classified: Secondary | ICD-10-CM | POA: Diagnosis not present

## 2020-04-20 NOTE — Progress Notes (Signed)
[name], [name] ([id number]) Visit Report for 04/20/2020 Arrival Information Details Patient Name: [name], [name]. Date of Service: 04/20/2020 12:45 PM Medical Record Number: [id number] Patient Account Number: [id number] Date of Birth/Sex: [birth year] (74 y.o. F) Treating RN: Cornell Barman Primary Care Mamie Diiorio: [name] Other Clinician: Referring Ellenore Roscoe: [name] Treating Layney Gillson/Extender: Tito Dine in Treatment: 33 Visit Information History Since Last Visit Added or deleted any medications: No Patient Arrived: Ambulatory Any new allergies or adverse reactions: No Arrival Time: 12:57 Had a fall or experienced change in No Accompanied By: self activities of daily living that may affect Transfer Assistance: None risk of falls: Patient Identification Verified: Yes Signs or symptoms of abuse/neglect since last visito No Secondary Verification Process Completed: Yes Hospitalized since last visit: No Patient Requires Transmission-Based No Implantable device outside of the clinic excluding No Precautions: cellular tissue based products placed in the center Patient Has Alerts: Yes since last visit: Patient Alerts: Patient on Blood Has Dressing in Place as Prescribed: Yes Thinner Has Compression in Place as Prescribed: Yes aspirin 81 Pain Present Now: No Electronic Signature(s) Signed: 04/20/2020 4:31:33 PM By: Lorine Bears RCP, RRT, CHT Entered By: Lorine Bears on 04/20/2020 13:00:37 Harrell, [name] ([id number]) -------------------------------------------------------------------------------- Compression Therapy Details Patient Name: [name], [name]. Date of Service: 04/20/2020 12:45 PM Medical Record Number: [id number] Patient Account Number: [id number] Date of Birth/Sex: [birth year] (74 y.o. F) Treating RN: Cornell Barman Primary Care Kailo Kosik: [name] Other Clinician: Referring Nic Lampe: [name] Treating Shantele Reller/Extender: Tito Dine in Treatment: 71 Compression Therapy Performed for Wound Assessment: Wound #5 Left,Medial Lower Leg Performed By: Clinician Cornell Barman, RN Compression Type: Three Layer Pre Treatment ABI: 1 Post Procedure Diagnosis Same as Pre-procedure Electronic Signature(s) Signed: 04/20/2020 5:04:05 PM By: [name], [name], RN, CWS, [name], [name] Entered By: [name], [name], RN, CWS, Kim on 04/20/2020 13:29:54 [name] ([id number]) -------------------------------------------------------------------------------- Encounter Discharge Information Details Patient Name: [name], [name]. Date of Service: 04/20/2020 12:45 PM Medical Record Number: [id number] Patient Account Number: [id number] Date of Birth/Sex: 28-Dec-1945 (74 y.o. F) Treating RN: Cornell Barman Primary Care Puneet Masoner: [name] Other Clinician: Referring Ayriana Wix: [name] Treating Ivannah Zody/Extender: Tito Dine in Treatment: 61 Encounter Discharge Information Items Post Procedure Vitals Discharge Condition: Stable Temperature (F): 98.3 Ambulatory Status: Ambulatory Pulse (bpm): 78 Discharge Destination: Home Respiratory Rate (breaths/min): 18 Transportation: Private Auto Blood Pressure (mmHg): 138/70 Accompanied By: self Schedule Follow-up Appointment: Yes Clinical Summary of Care: Electronic Signature(s) Signed: 04/20/2020 5:04:05 PM By: [name], [name], RN, CWS, [name], [name] Entered By: [name], [name], RN, CWS, Kim on 04/20/2020 13:31:50 [name] ([id number]) -------------------------------------------------------------------------------- Lower Extremity Assessment Details Patient Name: [name], [name]. Date of Service: 04/20/2020 12:45 PM Medical Record Number: [id number] Patient Account Number: [id number] Date of Birth/Sex: [birth year] (74 y.o. F) Treating RN: Montey Hora Primary Care Anniya Whiters: [name] Other Clinician: Referring Sidda Humm:  [name] Treating Vince [name]/Extender: Tito Dine in Treatment: 71 Edema Assessment Assessed: [Left: No] [Right: No] Edema: [Left: Ye] [Right: s] Calf Left: Right: Point of Measurement: 33 cm From Medial Instep 45 cm cm Ankle Left: Right: Point of Measurement: 10 cm From Medial Instep 23 cm cm Vascular Assessment Pulses: Dorsalis Pedis Palpable: [Left:Yes] Electronic Signature(s) Signed: 04/20/2020 4:35:33 PM By: Montey Hora Entered By: Montey Hora on 04/20/2020 13:10:25 Ritchey, [name] ([id number]) -------------------------------------------------------------------------------- Multi Wound Chart Details Patient Name: [name], [name]. Date of Service: 04/20/2020 12:45 PM Medical Record Number: [id number] Patient Account Number: [id number]  Date of Birth/Sex: [birth year] (74 y.o. F) Treating RN: Cornell Barman Primary Care Ayeisha Lindenberger: [name] Other Clinician: Referring Karman Biswell: [name] Treating Ilia Engelbert/Extender: Tito Dine in Treatment: 76 Vital Signs Height(in): 20 Pulse(bpm): 18 Weight(lbs): 224.7 Blood Pressure(mmHg): 138/70 Body Mass Index(BMI): 40 Temperature(F): 98.3 Respiratory Rate(breaths/min): 18 Photos: [N/A:N/A] Wound Location: Left, Medial Lower Leg Left, Lateral Lower Leg N/A Wounding Event: Gradually Appeared Gradually Appeared N/A Primary Etiology: Lymphedema Venous Leg Ulcer N/A Comorbid History: Cataracts, Asthma, Sleep Apnea, Cataracts, Asthma, Sleep Apnea, N/A Deep Vein Thrombosis, Deep Vein Thrombosis, Hypertension, Peripheral Venous Hypertension, Peripheral Venous Disease, Osteoarthritis, Received Disease, Osteoarthritis, Received Chemotherapy, Received Radiation Chemotherapy, Received Radiation Date Acquired: 11/19/2018 01/19/2019 N/A Weeks of Treatment: 71 65 N/A Wound Status: Open Open N/A Measurements L x W x D (cm) 1.1x0.8x0.1 0.6x1.1x0.1 N/A Area (cm) : 0.691 0.518 N/A Volume (cm) : 0.069  0.052 N/A % Reduction in Area: 91.90% -135.50% N/A % Reduction in Volume: 91.90% -136.40% N/A Classification: Full Thickness Without Exposed Full Thickness Without Exposed N/A Support Structures Support Structures Exudate Amount: Medium Medium N/A Exudate Type: Serous Serous N/A Exudate Color: amber amber N/A Wound Margin: Flat and Intact Flat and Intact N/A Granulation Amount: Medium (34-66%) Medium (34-66%) N/A Granulation Quality: Pink Pink N/A Necrotic Amount: Medium (34-66%) Medium (34-66%) N/A Exposed Structures: Fat Layer (Subcutaneous Tissue) Fat Layer (Subcutaneous Tissue) N/A Exposed: Yes Exposed: Yes Fascia: No Fascia: No Tendon: No Tendon: No Muscle: No Muscle: No Joint: No Joint: No Bone: No Bone: No Epithelialization: None None N/A Debridement: Debridement - Excisional N/A N/A Pre-procedure Verification/Time 13:27 N/A N/A Out Taken: Pain Control: Lidocaine N/A N/A Tissue Debrided: Subcutaneous, Slough N/A N/A Level: Skin/Subcutaneous Tissue N/A N/A Debridement Area (sq cm): 0.88 N/A N/A Instrument: Curette N/A N/A Bleeding: Minimum N/A N/A Hemostasis Achieved: Pressure N/A N/A [name], [name] ([id number]) Debridement Treatment Procedure was tolerated well N/A N/A Response: Post Debridement 1.1x0.8x0.2 N/A N/A Measurements L x W x D (cm) Post Debridement Volume: 0.138 N/A N/A (cm) Procedures Performed: Compression Therapy N/A N/A Debridement Treatment Notes Electronic Signature(s) Signed: 04/20/2020 4:26:25 PM By: Linton Ham MD Entered By: Linton Ham on 04/20/2020 13:31:15 Legler, [name] ([id number]) -------------------------------------------------------------------------------- Multi-Disciplinary Care Plan Details Patient Name: [name], [name]. Date of Service: 04/20/2020 12:45 PM Medical Record Number: [id number] Patient Account Number: [id number] Date of Birth/Sex: 12-21-1945 (74 y.o. F) Treating RN: Cornell Barman Primary Care Georgie Eduardo:  [name] Other Clinician: Referring Adora Yeh: [name] Treating Kaikoa Magro/Extender: Tito Dine in Treatment: 26 Active Inactive Medication Nursing Diagnoses: Knowledge deficit related to medication safety: actual or potential Goals: Patient/caregiver will demonstrate understanding of new oral/IV medications prescribed at the [facility] (topical prescriptions are covered under the skin breakdown problem) Date Initiated: 12/16/2019 Target Resolution Date: 01/13/2020 Goal Status: Active Interventions: Assess for medication contraindications each visit where new medications are prescribed Treatment Activities: New medication prescribed at [place] : 12/16/2019 Notes: Soft Tissue Infection Nursing Diagnoses: Impaired tissue integrity Goals: Patient's soft tissue infection will resolve Date Initiated: 12/10/2018 Target Resolution Date: 01/09/2019 Goal Status: Active Interventions: Assess signs and symptoms of infection every visit Notes: Venous Leg Ulcer Nursing Diagnoses: Actual venous Insuffiency (use after diagnosis is confirmed) Goals: Patient will maintain optimal edema control Date Initiated: 12/10/2018 Target Resolution Date: 01/09/2019 Goal Status: Active Interventions: Assess peripheral edema status every visit. Treatment Activities: Therapeutic compression applied : 12/10/2018 Notes: Wound/Skin Impairment Nursing Diagnoses: [name], [name] ([id number]) Impaired tissue integrity Goals: Patient/caregiver will verbalize understanding of skin care regimen Date Initiated:  12/10/2018 Target Resolution Date: 01/09/2019 Goal Status: Active Interventions: Assess ulceration(s) every visit Treatment Activities: Topical wound management initiated : 12/10/2018 Notes: Electronic Signature(s) Signed: 04/20/2020 5:04:05 PM By: [name], [name], RN, CWS, [name], [name] Entered By: [name], [name], RN, CWS, Kim on 04/20/2020 13:25:46 [name], Amber [name]  ([id number]) -------------------------------------------------------------------------------- Pain Assessment Details Patient Name: [name], [name]. Date of Service: 04/20/2020 12:45 PM Medical Record Number: [id number] Patient Account Number: [id number] Date of Birth/Sex: [birth year] (74 y.o. F) Treating RN: Montey Hora Primary Care Azhar Knope: [name] Other Clinician: Referring Ivie Maese: [name] Treating Amelio Brosky/Extender: Tito Dine in Treatment: 29 Active Problems Location of Pain Severity and Description of Pain Patient Has Paino No Site Locations Pain Management and Medication Current Pain Management: Electronic Signature(s) Signed: 04/20/2020 4:35:33 PM By: Montey Hora Entered By: Montey Hora on 04/20/2020 13:04:10 [name] ([id number]) -------------------------------------------------------------------------------- Patient/Caregiver Education Details Patient Name: [name], [name]. Date of Service: 04/20/2020 12:45 PM Medical Record Number: [id number] Patient Account Number: [id number] Date of Birth/Gender: May 23, 1946 (74 y.o. F) Treating RN: Cornell Barman Primary Care Physician: [name] Other Clinician: Referring Physician: [name] Treating Physician/Extender: Tito Dine in Treatment: 47 Education Assessment Education Provided To: Patient Education Topics Provided Wound/Skin Impairment: Handouts: Caring for Your Ulcer Methods: Demonstration, Explain/Verbal Responses: State content correctly Electronic Signature(s) Signed: 04/20/2020 5:04:05 PM By: [name], [name], RN, CWS, [name], [name] Entered By: [name], [name], RN, CWS, Kim on 04/20/2020 13:30:35 [name] ([id number]) -------------------------------------------------------------------------------- Wound Assessment Details Patient Name: [name], [name]. Date of Service: 04/20/2020 12:45 PM Medical Record Number: [id number] Patient Account Number:  [id number] Date of Birth/Sex: [birth year] (74 y.o. F) Treating RN: Montey Hora Primary Care Aubriauna Riner: [name] Other Clinician: Referring Jlon Betker: [name] Treating Tian Davison/Extender: Tito Dine in Treatment: 32 Wound Status Wound Number: 5 Primary Lymphedema Etiology: Wound Location: Left, Medial Lower Leg Wound Open Wounding Event: Gradually Appeared Status: Date Acquired: 11/19/2018 Comorbid Cataracts, Asthma, Sleep Apnea, Deep Vein Thrombosis, Weeks Of Treatment: 71 History: Hypertension, Peripheral Venous Disease, Osteoarthritis, Clustered Wound: No Received Chemotherapy, Received Radiation Photos Wound Measurements Length: (cm) 1.1 Width: (cm) 0.8 Depth: (cm) 0.1 Area: (cm) 0.691 Volume: (cm) 0.069 % Reduction in Area: 91.9% % Reduction in Volume: 91.9% Epithelialization: None Tunneling: No Undermining: No Wound Description Classification: Full Thickness Without Exposed Support Structures Wound Margin: Flat and Intact Exudate Amount: Medium Exudate Type: Serous Exudate Color: amber Foul Odor After Cleansing: No Slough/Fibrino Yes Wound Bed Granulation Amount: Medium (34-66%) Exposed Structure Granulation Quality: Pink Fascia Exposed: No Necrotic Amount: Medium (34-66%) Fat Layer (Subcutaneous Tissue) Exposed: Yes Necrotic Quality: Adherent Slough Tendon Exposed: No Muscle Exposed: No Joint Exposed: No Bone Exposed: No Treatment Notes Wound #5 (Left, Medial Lower Leg) Notes alginate, ABD, TCA, 4 layer Electronic Signature(s) IDALIE, CANTO ([id number]) Signed: 04/20/2020 4:35:33 PM By: Montey Hora Entered By: Montey Hora on 04/20/2020 13:17:36 Dowling, [name] ([id number]) -------------------------------------------------------------------------------- Wound Assessment Details Patient Name: MANUELLA, BLACKSON. Date of Service: 04/20/2020 12:45 PM Medical Record Number: [id number] Patient Account Number:  [id number] Date of Birth/Sex: July 24, 1946 (74 y.o. F) Treating RN: Montey Hora Primary Care Jaryan Chicoine: [name] Other Clinician: Referring Ellery Tash: [name] Treating Adian Jablonowski/Extender: Tito Dine in Treatment: 71 Wound Status Wound Number: 6 Primary Venous Leg Ulcer Etiology: Wound Location: Left, Lateral Lower Leg Wound Open Wounding Event: Gradually Appeared Status: Date Acquired: 01/19/2019 Comorbid Cataracts, Asthma, Sleep Apnea, Deep Vein Thrombosis, Weeks Of Treatment: 65 History: Hypertension, Peripheral Venous Disease, Osteoarthritis, Clustered Wound:  No Received Chemotherapy, Received Radiation Photos Wound Measurements Length: (cm) 0.6 Width: (cm) 1.1 Depth: (cm) 0.1 Area: (cm) 0.518 Volume: (cm) 0.052 % Reduction in Area: -135.5% % Reduction in Volume: -136.4% Epithelialization: None Tunneling: No Undermining: No Wound Description Classification: Full Thickness Without Exposed Support Structu Wound Margin: Flat and Intact Exudate Amount: Medium Exudate Type: Serous Exudate Color: amber res Foul Odor After Cleansing: No Slough/Fibrino Yes Wound Bed Granulation Amount: Medium (34-66%) Exposed Structure Granulation Quality: Pink Fascia Exposed: No Necrotic Amount: Medium (34-66%) Fat Layer (Subcutaneous Tissue) Exposed: Yes Necrotic Quality: Adherent Slough Tendon Exposed: No Muscle Exposed: No Joint Exposed: No Bone Exposed: No Treatment Notes Wound #6 (Left, Lateral Lower Leg) Notes alginate, ABD, TCA, 4 layer Electronic Signature(s) AZRA, ABRELL ([id number]) Signed: 04/20/2020 4:35:33 PM By: Montey Hora Entered By: Montey Hora on 04/20/2020 13:18:29 Rilling, [name] ([id number]) -------------------------------------------------------------------------------- Vitals Details Patient Name: TIANA, SIVERTSON. Date of Service: 04/20/2020 12:45 PM Medical Record Number: [id number] Patient Account Number:  [id number] Date of Birth/Sex: [birth year] (74 y.o. F) Treating RN: Cornell Barman Primary Care Jerek Meulemans: [name] Other Clinician: Referring Kryslyn Helbig: [name] Treating Shandricka Monroy/Extender: Tito Dine in Treatment: 71 Vital Signs Time Taken: 13:00 Temperature (F): 98.3 Height (in): 63 Pulse (bpm): 78 Weight (lbs): 224.7 Respiratory Rate (breaths/min): 18 Body Mass Index (BMI): 39.8 Blood Pressure (mmHg): 138/70 Reference Range: 80 - 120 mg / dl Electronic Signature(s) Signed: 04/20/2020 4:31:33 PM By: Lorine Bears RCP, RRT, CHT Entered By: Lorine Bears on 04/20/2020 13:02:34

## 2020-04-20 NOTE — Progress Notes (Signed)
PRAISE, STENNETT ([id number]) Visit Report for 04/20/2020 Debridement Details Patient Name: [name], RAMP. Date of Service: 04/20/2020 12:45 PM Medical Record Number: [id number] Patient Account Number: [id number] Date of Birth/Sex: 02-Sep-1946 (74 y.o. F) Treating RN: Cornell Barman Primary Care Provider: [name] Other Clinician: Referring Provider: [name] Treating Provider/Extender: Tito Dine in Treatment: 74 Debridement Performed for Wound #5 Left,Medial Lower Leg Assessment: Performed By: Physician [name] Dillon, MD Debridement Type: Debridement Level of Consciousness (Pre- Awake and Alert procedure): Pre-procedure Verification/Time Out Yes - 13:27 Taken: Start Time: 13:27 Pain Control: Lidocaine Total Area Debrided (L x W): 1.1 (cm) x 0.8 (cm) = 0.88 (cm) Tissue and other material Viable, Non-Viable, Slough, Subcutaneous, Biofilm, Slough debrided: Level: Skin/Subcutaneous Tissue Debridement Description: Excisional Instrument: Curette Bleeding: Minimum Hemostasis Achieved: Pressure Response to Treatment: Procedure was tolerated well Level of Consciousness (Post- Awake and Alert procedure): Post Debridement Measurements of Total Wound Length: (cm) 1.1 Width: (cm) 0.8 Depth: (cm) 0.2 Volume: (cm) 0.138 Character of Wound/Ulcer Post Debridement: Stable Post Procedure Diagnosis Same as Pre-procedure Electronic Signature(s) Signed: 04/20/2020 4:26:25 PM By: Linton Ham MD Signed: 04/20/2020 5:04:05 PM By: [name], [name], RN, CWS, [name], [name] Entered By: Linton Ham on 04/20/2020 13:31:29 [name], [name] ([id number]) -------------------------------------------------------------------------------- HPI Details Patient Name: [name]. Date of Service: 04/20/2020 12:45 PM Medical Record Number: [id number] Patient Account Number: [id number] Date of Birth/Sex: 09/27/1946 (74 y.o. F) Treating RN: Cornell Barman Primary Care Provider:  [name] Other Clinician: Referring Provider: [name] Treating Provider/Extender: Tito Dine in Treatment: 67 History of Present Illness HPI Description: Pleasant 74 year old with history of chronic venous insufficiency. No diabetes or peripheral vascular disease. Left ABI 1.29. Questionable history of left lower extremity DVT. She developed a recurrent ulceration on her left lateral calf in December 2015, which she attributes to poor diet and subsequent lower extremity edema. She underwent endovenous laser ablation of her left greater saphenous vein in 2010. She underwent laser ablation of accessory branch of left GSV in April 2016 by Dr. Kellie Simmering at [facility]. She was previously wearing Unna boots, which she tolerated well. Tolerating 2 layer compression and cadexomer iodine. She returns to clinic for follow-up and is without new complaints. She denies any significant pain at this time. She reports persistent pain with pressure. No claudication or ischemic rest pain. No fever or chills. No drainage. READMISSION 11/13/16; this is a 74 year old woman who is not a diabetic. She is here for a review of a painful area on her left medial lower extremity. I note that she was seen here previously last year for wound I believe to be in the same area. At that time she had undergone previously a left greater saphenous vein ablation by Dr. Kellie Simmering and she had a ablation of the anterior accessory branch of the left greater saphenous vein in March 2016. Seeing that the wound actually closed over. In reviewing the history with her today the ulcer in this area has been recurrent. She describes a biopsy of this area in 2009 that only showed stasis physiology. 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 Do not see that she has ever had arterial studies in the left leg. She tells me she has a follow-up with Dr.  Kellie Simmering in roughly 10 days In any case she developed the reopening of this area roughly a month ago. On the background of this she describes rapidly increasing edema which has responded to Lasix 40 mg and metolazone 2.5 mg as well as the patient's lymph massage. She has been told she has both venous insufficiency and lymphedema but she cannot tolerate compression stockings 11/28/16; the patient saw Dr. Kellie Simmering recently. Per the patient he did arterial Dopplers in the office that did not show evidence of arterial insufficiency, per the patient he stated "treat this like an ordinary venous ulcer". She also saw her dermatologist Dr. Ronnald Ramp who felt that this was more of a vascular ulcer. In general things are improving although she arrives today with increasing bilateral lower extremity edema with weeping a deeper fluid through the wound on the left medial leg compatible with some degree of lymphedema 12/04/16; the patient's wound is fully epithelialized but I don't think fully healed. We will do another week of depression with Promogran and TCA however I suspect we'll be able to discharge her next week. This is a very unusual-looking wound which was initially a figure-of-eight type wound lying on its side surrounded by petechial like hemorrhage. She has had venous ablation on this side. She apparently does not have an arterial issue per Dr. Kellie Simmering. She saw her dermatologist thought it was "vascular". Patient is definitely going to need ongoing compression and I talked about this with her today she will go to elastic therapy after she leaves here next week 12/11/16; the patient's wound is not completely closed today. She has surrounding scar tissue and in further discussion with the patient it would appear that she had ulcers in this area in 2009 for a  prolonged period of time ultimately requiring a punch biopsy of this area that only showed venous insufficiency. I did not previously pickup on this part of the history from the patient. 12/18/16; the patient's wound is completely epithelialized. There is no open area here. She has significant bilateral venous insufficiency with secondary lymphedema to a mild-to-moderate degree she does not have compression stockings.. She did not say anything to me when I was in the room, she told our intake nurse that she was still having pain in this area. This isn't unusual recurrent small open area. She is going to go to elastic therapy to obtain compression stockings. 12/25/16; the patient's wound is fully epithelialized. There is no open area here. The patient describes some continued episodic discomfort in this area medial left calf. However everything looks fine and healed here. She is been to elastic therapy and caught herself 15-20 mmHg stockings, they apparently were having trouble getting 20-30 mm stockings in her size 01/22/17; this is a patient we discharged from the clinic a month ago. She has a recurrent open wound on her medial left calf. She had 15 mm support stockings. I told her I thought she needed 20-30 mm compression stockings. She tells me that she has been ill with hospitalization secondary to asthma and is been found to have severe hypokalemia likely secondary to a combination of Lasix and metolazone. This morning she noted blistering and leaking fluid on the posterior part of her left leg. She called our intake nurse urgently and we was saw her this afternoon. She has not had any real discomfort here. I don't know that she's been wearing any stockings on this leg for at least 2-3 days. ABIs in this clinic were 1.21 on the right  and 1.3 on the left. She is previously seen vascular surgery who does not think that there is a peripheral arterial issue. 01/30/17; Patient arrives with no open wound  on the left leg. She has been to elastic therapy and obtained 20-75mmhg below knee stockings and she has one on the right leg today. READMISSION 02/19/18; this Mckethan is a now 74 year old patient we've had in this clinic perhaps 3 times before. I had last looked at her from January 07 December 2016 with an area on the medial left leg. We discharged her on 12/25/16 however she had to be readmitted on 01/22/17 with a recurrence. I have in my notes that we discharged her on 20-30 mm stockings although she tells me she was only wearing support hose because she cannot get stockings on predominantly related to her cervical spine surgery/issues. She has had previous ablations done by vein and vascular in [place] including a great saphenous vein ablation on the left with an anterior accessory branch ablation I think both of these were in 2016. On one of the previous visit she had a biopsy noted 2009 that was negative. She is not felt to have an arterial issue. She is not a diabetic. She does have a history of obstructive sleep apnea hypertension asthma as well as chronic venous insufficiency and lymphedema. On this occasion she noted 2 dry scaly patch on her left leg. She tried to put lotion on this it didn't really help. There were 2 open areas.the patient has been seeing her primary physician from 02/05/18 through 02/14/18. She had Unna boots applied. The superior wound now on the lateral left leg has closed but she's had one wound that remains open on the lateral left leg. This is not the same spot as we dealt with in 2018. ABIs in this clinic were 1.3 bilaterally [name], [name] ([id number]) 02/26/18; patient has a small wound on the left lateral calf. Dimensions are down. She has chronic venous insufficiency and lymphedema. 03/05/18; small open area on the left lateral calf. Dimensions are down. Tightly adherent necrotic debris over the surface of the wound which was difficult to remove. Also the dressing  [over collagen] stuck to the wound surface. This was removed with some difficulty as well. Change the primary dressing to Hydrofera Blue ready 03/12/18; small open area on the left lateral calf. Comes in with tightly adherent surface eschar as well as some adherent Hydrofera Blue. 03/19/18; open area on the left lateral calf. Again adherent surface eschar as well as some adherent Hydrofera Blue nonviable subcutaneous tissue. She complained of pain all week even with the reduction from 4-3 layer compression I put on last week. Also she had an increase in her ankle and calf measurements probably related to the same thing. 03/26/18; open area on the left lateral calf. A very small open area remains here. We used silver alginate starting last week as the Hydrofera Blue seem to stick to the wound bed. In using 4-layer compression 04/02/18; the open area in the left lateral calf at some adherent slough which I removed there is no open area here. We are able to transition her into her own compression stocking. Truthfully I think this is probably his support hose. However this does not maintain skin integrity will be limited. She cannot put over the toe compression stockings on because of neck problems hand problems etc. She is allergic to the lining layer of juxta lites. We might be forced to use extremitease stocking should this  fail READMIT 11/24/2018 Patient is now a 74 year old woman who is not a diabetic. She has been in this clinic on at least 3 previous occasions largely with recurrent wounds on her left leg secondary to chronic venous insufficiency with secondary lymphedema. Her situation is complicated by inability to get stockings on and an allergy to neoprene which is apparently a component and at least juxta lites and other stockings. As a result she really has not been wearing any stockings on her legs. She tells [place] that roughly 2 or 3 weeks ago she started noticing a stinging sensation just above  her ankle on the left medial aspect. She has been diagnosed with pseudogout and she wondered whether this was what she was experiencing. She tried to dress this with something she bought at the store however subsequently it pulled skin off and now she has an open wound that is not improving. She has been using Vaseline gauze with a cover bandage. She saw her primary doctor last week who put an Haematologist on her. ABIs in this clinic was 1.03 on the left 2/12; the area is on the left medial ankle. Odd-looking wound with what looks to be surface epithelialization but a multitude of small petechial openings. This clearly not closed yet. We have been using silver alginate under 3 layer compression with TCA 2/19; the wound area did not look quite as good this week. Necrotic debris over the majority of the wound surface which required debridement. She continues to have a multitude of what looked to be small petechial openings. She reminds [place] that she had a biopsy on this initially during her first outbreak in 2015 in [place] dermat[facility]. She expresses concern about this being a possible melanoma. 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 I assured her that this does not look anything like melanoma. Except for the petechial reaction it does look like a venous insufficiency area and she certainly has evidence of this on both sides 2/26; a difficult area on the left medial ankle. The patient clearly has chronic venous hypertension with some degree of lymphedema. The odd thing about the area is the small petechial hemorrhages. I am not really sure how to explain this. This was present last time and this is not a compression injury. We have been using Hydrofera Blue which I changed to last week 3/4; still using Hydrofera Blue. Aggressive debridement today. She does not have known arterial issues. She has seen Dr. Kellie Simmering  at [facility] vein and vascular and and has an ablation on the left. [Anterior accessory branch of the greater saphenous]. From what I remember they did not feel she had an arterial issue. The patient has had this area biopsied in 2009 at [facility] dermatology and by her recollection they said this was "stasis". She is also follow-up with dermatology locally who thought that this was more of a vascular issue 3/11; using Hydrofera Blue. Aggressive debridement today. She does not have an arterial issue. We are using 3 layer compression although we may need to go to 4. The patient has been in for multiple changes to her wrap since I last saw her a week ago. She says that the area was leaking. I do not have too much more information on what was found 01/19/19 on evaluation today patient was actually being seen for a nurse visit when unfortunately she had the area on her left lateral lower extremity  as well as weeping from the right lower extremity that became apparent. Therefore we did end up actually seeing her for a full visit with myself. She is having some pain at this site as well but fortunately nothing too significant at this point. No fevers, chills, nausea, or vomiting noted at this time. 3/18-Patient is back to the clinic with the left leg venous leg ulcer, the ulcer is larger in size, has a surface that is densely adherent with fibrinous tissue, the Hydrofera Blue was used but is densely adherent and there was difficulty in removing it. The right lower extremity was also wrapped for weeping edema. Patient has a new area over the left lateral foot above the malleolus that is small and appears to have no debris with intact surrounding skin. Patient is on increased dose of Lasix also as a means to edema management 3/25; the patient has a nonhealing venous ulcer on the medial left leg and last week developed a smaller area on the lateral left calf. We have been using Hydrofera Blue with a contact  layer. 4/1; no major change in these wounds areas. Left medial and more recently left lateral calf. I tried Iodoflex last week to aid in debridement she did not tolerate this. She stated her pain was terrible all week. She took the top layer of the 4 layer compression off. 4/8; the patient actually looks somewhat better in terms of her more prominent left lateral calf wound. There is some healthy looking tissue here. She is still complaining of a lot of discomfort. 4/15; patient in a lot of pain secondary to sciatica. She is on a prednisone taper prescribed by her primary physician. She has the 2 areas one on the left medial and more recently a smaller area on the left lateral calf. Both of these just above the malleoli 4/22; her back pain is better but she still states she is very uncomfortable and now feels she is intolerant to the [employer]. No real change in the wounds we have been using Sorbact. She has been previously intolerant to Iodoflex. There is not a lot of option about what we can use to debride this wound under compression that she no doubt needs. sHe states Ultram no longer works for her pain 4/29; no major change in the wounds slightly increased depth. Surface on the original medial wound perhaps somewhat improved however the more recent area on the lateral left ankle is 100% covered in very adherent debris we have been using Sorbact. She tolerates 4 layer compression well and her edema control is a lot better. She has not had to come in for a nurse check 5/6; no major change in the condition of the wounds. She did consent to debridement today which was done with some difficulty. Continuing Sorbact. She did not tolerate Iodoflex. She was in for a check of her compression the day after we wrapped her last week this was adjusted but nothing much was found 5/13; no major change in the condition or area of the wounds. I was able to get a fairly aggressive debridement done on the lateral  left leg wound. Even using Sorbact under compression. She came back in on Friday to have the wrap changed. She says she felt uncomfortable on the lateral aspect of her ankle. She has a long history of chronic venous insufficiency including previous ablation surgery on this side. 5/20-Patient returns for wounds on left leg with both wounds covered in slough, with the lateral leg  wound larger in size, she has been in 3 layer compression and felt more comfortable, she describes pain in ankle, in leg and pins and needles in foot, and is about to try Pamelor for this 6/3; wounds on the left lateral and left medial leg. The area medially which is the most recent of the 2 seems to have had the largest increase in Burton, SAVEAH BAHAR. ([id number]) dimensions. We have been using Sorbac to try and debride the surface. She has been to see orthopedics they apparently did a plain x-ray that was indeterminant. Diagnosed her with neuropathy and they have ordered an MRI to determine if there is underlying osteomyelitis. This was not high on my thought list but I suppose it is prudent. We have advised her to make an appointment with vein and vascular in [place]. She has a history of a left greater saphenous and accessory vein ablations I wonder if there is anything else that can be done from a surgical point of view to help in these difficult refractory wounds. We have previously healed this wound on one occasion but it keeps on reopening [medial side] 6/10; deep tissue culture I did last week I think on the left medial wound showed both moderate E. coli and moderate staph aureus [MSSA]. She is going to require antibiotics and I have chosen Augmentin. We have been using Sorbact and we have made better looking wound surface on both sides but certainly no improvement in wound area. She was back in last Friday apparently for a dressing changes the wrap was hurting her outer left ankle. She has not managed to get a hold of  vein and vascular in [place]. We are going to have to make her that appointment 6/17; patient is tolerating the Augmentin. She had an MRI that I think was ordered by orthopedic surgeon this did not show osteomyelitis or an abscess did suggest cellulitis. We have been using Sorbact to the lateral and medial ankles. We have been trying to arrange a follow-up appointment with vein and vascular in [place] or did her original ablations. We apparently an area sent the request to vein and vascular in [facility] 6/24; patient has completed the Augmentin. We do not yet have a vein and vascular appointment in [place]. I am not sure what the issue is here we have asked her to call tomorrow. We are using Sorbact. Making some improvements and especially the medial wound. Both surfaces however look better medial and lateral. 7/1; the patient has been in contact with vein and vascular in [place] but has not yet received an appointment. Using Sorbact we have gradually improve the wound surface with no improvement in surface area. She is approved for Apligraf but the wound surface still is not completely viable. She has not had to come in for a dressing change 7/8; the patient has an appointment with vein and vascular on 7/31 which is a Friday afternoon. She is concerned about getting back here for [place] to dress her wounds. I think it is important to have them goal for her venous reflux/history of ablations etc. to see if anything else can be done. She apparently tested positive for 1 of the blood tests with regards to lupus and saw a rheumatologist. He has raised the issue of vasculitis again. I have had this thought in the past however the evidence seems overwhelming that this is a venous reflux etiology. If the rheumatologist tells me there is clinical and laboratory investigation is positive for lupus I  will rethink this. 7/15; the patient's wound surfaces are quite a bit better. The medial area which  was her original wound now has no depth although the lateral wound which was the more recent area actually appears larger. Both with viable surfaces which is indeed better. Using Sorbact. I wanted to use Apligraf on her however there is the issue of the vein and vascular appointment on 7/31 at 2:00 in the afternoon which would not allow her to get back to be rewrapped and they would no doubt remove the graft 7/22; the patient's wound surfaces have moderate amount of debris although generally look better. The lateral one is larger with 2 small satellite areas superiorly. We are waiting for her vein and vascular appointment on 7/31. She has been approved for Apligraf which I would like to use after th 7/29; wound surfaces have improved no debridement is required we have been using Sorbact. She sees vein and vascular on Friday with this so question of whether anything can be done to lessen the likelihood of recurrence and/or speed the healing of these areas. She is already had previous ablations. She no doubt has severe venous hypertension 8/5-Patient returns at 1 week, she was in [place] for 3 days by her podiatrist, we have been using so backed to the wound, she has increased pain in both the wounds on the left lower leg especially the more distal one on the lateral aspect 8/12-Patient returns at 1 week and she is agreeable to having debridement in both wounds on her left leg today. We have been using Sorbact, and vascular studies were reviewed at last visit 8/19; the patient arrives with her wounds fairly clean and no debridement is required. We have used Sorbact which is really done a nice job in cleaning up these very difficult wound surfaces. The patient saw Dr. Donzetta Matters of vascular surgery on 7/31. He did not feel that there was an arterial component. He felt that her treated greater saphenous vein is adequately addressed and that the small saphenous vein did not appear to be involved  significantly. She was also noted to have deep venous reflux which is not treatable. Dr. Donzetta Matters mentioned the possibility of a central obstructive component leading to reflux and he offered her central venography. She wanted to discuss this or think about it. I have urged her to go ahead with this. She has had recurrent difficult wounds in these areas which do heal but after months in the clinic. If there is anything that can be done to reduce the likelihood of this I think it is worth it. 9/2 she is still working towards getting follow-up with Dr. Donzetta Matters to schedule her CT. Things are quite a bit worse venography. I put Apligraf on 2 weeks ago on both wounds on the medial and lateral part of her left lower leg. She arrives in clinic today with 3 superficial additional wounds above the area laterally and one below the wound medially. She describes a lot of discomfort. I think these are probably wrapped injuries. Does not look like she has cellulitis. 07/20/2019 on evaluation today patient appears to be doing somewhat poorly in regard to her lower extremity ulcers. She in fact showed signs of erythema in fact we may even be dealing with an infection at this time. Unfortunately I am unsure if this is just infection or if indeed there may be some allergic reaction that occurred as a result of the Apligraf application. With that being said that would  be unusual but nonetheless not impossible in this patient is one who is unfortunately allergic to quite a bit. Currently we have been using the Sorbact which seems to do as well as anything for her. I do think we may want to obtain a culture today to see if there is anything showing up there that may need to be addressed. 9/16; noted that last week the wounds look worse in 1 week follow-up of the Apligraf. Using Sorbact as of 2 days ago. She arrives with copious amounts of drainage and new skin breakdown on the back of the left calf. The wounds arm more substantial  bilaterally. There is a fair amount of swelling in the left calf no overt DVT there is edema present I think in the left greater than right thigh. She is supposed to go on 9/28 for CT venography. The wounds on the medial and lateral calf are worse and she has new skin breakdown posteriorly at least new for me. This is almost developing into a circumferential wound area The Apligraf was taken off last week which I agree with things are not going in the right direction a culture was done we do not have that back yet. She is on Augmentin that she started 2 days ago 9/23; dressing was changed by her nurses on Monday. In general there is no improvement in the wound areas although the area looks less angry than last week. She did get Augmentin for MSSA cultured on the 14th. She still appears to have too much swelling in the left leg even with 3 layer compression 9/30; the patient underwent her procedure on 9/28 by Dr. Donzetta [facility]. She was discovered to have the common iliac vein measuring 12.2 mm but at the level of L4-L5 measured 3 mm. After stenting it measured 10 mm. 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 Rouleaux flow in the common femoral and femoral vein was observed much improved after stenting. We are using silver alginate to the wounds on the medial and lateral ankle on the left. 4 layer compression 10/7; the patient had fluid swelling around her knee and 4 layer compression. At the advice of vein and vascular this was reduced to 3 layer which she is tolerating better. We have been using silver alginate under 3 layer compression since last Friday 10/14; arrives with the areas on the left ankle looking a lot better. Inflammation in the area also a lot better. She came in for a nurse check on 10/9 10/21; continued nice improvement. Slight improvements in surface area of both the medial and lateral wounds on the left. A lot of the satellite lesions in  the weeping erythema around these from stasis dermatitis is resolved. We have been using silver alginate DAKAYLA, DISANTI ([id number]) 10/28; general improvement in the entire wound areas although not a lot of change in dimensions the wound certainly looks better. There is a lot less in terms of venous inflammation. Continue silver alginate this week however look towards Hydrofera Blue next week 11/4; very adherent debris on the medial wound left wound is not as bad. We have been using silver alginate. Change to [facility] today 11/11; very adherent debris on both wound areas. She went to vein and vascular last week and follow-up they put in [place] boot on this today. He says the [facility] was adherent. Wound is definitely not as good as last week. Especially on the left there the satellite lesions  look more prominent 11/18; absolutely no better. erythema on lateral aspect with tenderness. 09/30/2019 on evaluation today patient appears to actually be doing better. Dr. Dellia Nims did put her on doxycycline last week which I do believe has helped her at this point. Fortunately there is no signs of active infection at this time. No fevers, chills, nausea, vomiting, or diarrhea. I do believe he may want extend the doxycycline for 7 additional days just to ensure everything does completely cleared up the patient is in agreement with that plan. Otherwise she is going require some sharp debridement today 12/2; patient is completing a 2-week course of doxycycline. I gave her this empirically for inflammation as well as infection when I last saw her 2 weeks ago. All of this seems to be better. She is using silver alginate she has the area on the medial aspect of the larger area laterally and the 2 small satellite regions laterally above the major wound. 12/9; the patient's wound on the left medial and left lateral calf look really quite good. We have been using silver alginate. She saw vein and vascular in  follow-up on 10/09/2019. She has had a previous left greater saphenous vein ablation by Dr. Oscar La in 2016. More recently she underwent a left common iliac vein stent by Dr. 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 The swelling is improved and certainly the wounds have improved. The patient shows [place] today area on the right medial calf there is almost no wound but leaking lymphedema. She says she start this started 3 or 4 days ago. She did not traumatize it. It is not painful. She does not wear compression on that side 12/16; the patient continues to do well laterally. Medially still requiring debridement. The area on the right calf did not materialize to anything and is not currently open. We wrapped this last time. She has support stockings for that leg although I am not sure they are going to provide adequate compression 12/23; the lateral wound looks stable. Medially still requiring debridement for tightly adherent fibrinous debris. We've been using silver alginate. Surface area not any different 12/30; neither wound is any better with regards to surface and the area on the left lateral is larger. I been using silver alginate to the left lateral which look quite good last week and Sorbact to the left medial 11/11/2019. Lateral wound area actually looks better and somewhat smaller. Medial still requires a very aggressive debridement today. We have been using Sorbact on both wound areas 1/13; not much better still adherent debris bilaterally. I been using Sorbact. She has severe venous hypertension. Probably some degree of dermal fibrosis distally. I wonder whether tighter compression might help and I am going to try that today. We also need to work on the bioburden 1/20; using Sorbact. She has severe venous hypertension status post stent placement for pelvic vein compression. We applied gentamicin last time to see if we could reduce bioburden I had some discussion with her today about the  use of pentoxifylline. This is occasionally used in this setting for wounds with refractory venous insufficiency. However this interacts with Plavix. She tells me that she was put on this after stent placement for 3 months. She will call Dr. Claretha Cooper office to discuss 1/27; we are using gentamicin under Sorbact. 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 She has a stent. Wound medially is measuring smaller this week. Laterally measuring slightly larger although she has some satellite lesions superiorly 2/3;  gentamicin under Sorbact under 4-layer compression. 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 She has a stent on Plavix. Her wounds are measuring smaller this week. More substantially laterally where there is a satellite lesion superiorly. 2/10; gentamicin under Sorbac. 4-layer compression. Patient communicated with Dr. Donzetta Matters at vein and vascular in [place]. He is okay with the patient coming off Plavix I will therefore start her on pentoxifylline for a 1 month trial. In general her wounds look better today. I had some concerns about swelling in the left thigh however she measures 61.5 on the right and 63 on the mid thigh which does not suggest there is any difficulty. The patient is not describing any pain. 2/17; gentamicin under Sorbac 4-layer compression. She has been on pentoxifylline for 1 week and complains of loose stool. No nausea she is eating and drinking well 2/24; the patient apparently came in 2 days ago for a nurse visit when her wrap fell down. Both areas look a little worse this week macerated medially and satellite lesions laterally. Change to silver alginate today 3/3; wounds are larger today especially medially. She also has more swelling in her foot lower leg and I even noted some swelling in her posterior thigh which is tender. I wonder about the patency of her stent. Fortuitously she sees Dr. Claretha Cooper group on Friday 3/10;  Mrs. Sturgeon was seen by vein and vascular on 3/5. The patient underwent ultrasound. There was no evidence of thrombosis involving the IVC no evidence of thrombosis involving the right common iliac vein there is no evidence of thrombosis involving the right external iliac vein the left external vein is also patent. The right common iliac vein stent appears patent bilateral common femoral veins are compressible and appear patent. I was concerned about the left common iliac stent however it looks like this is functional. She has some edema in the posterior thigh that was tender she still has that this week. I also note they had trouble finding the pulses in her left foot and booked her for an ABI baseline in 4 weeks. She will follow up in 6 months for repeat IVC duplex. The patient stopped the pentoxifylline because of diarrhea. It does not look like that was being effective in any case. I have advised her to go back on her aspirin 81 mg tablet, vascular it also suggested this 3/17; comes in today with her wound surfaces a lot better. The excoriations from last week considerably better probably secondary to the TCA. We have been using silver alginate 3/24; comes in today with smaller wounds both medially and laterally. Both required debridement. There are 2 small satellite areas superiorly laterally. She also has a very odd bandlike area in the mid calf almost looking like there was a weakness in the wrap in a localized area. I would write this off as being this however anteriorly she has a small raised ballotable area that is very tender almost reminiscent of an abscess but there was no obvious purulent surface to it. 02/04/20 upon evaluation today patient appears to be doing fairly well in regard to her wounds today. Fortunately there is no signs of active infection at this time. No fevers, chills, nausea, vomiting, or diarrhea. She has been tolerating the dressing changes without  complication. Fortunately I feel like she is showing signs of improvement although has been sometime since have seen her. Nonetheless the area of concern that Dr. Dellia Nims had last week where she had possibly an area of  the wrap that was we can allow the leg to bulge appears to be doing significantly better today there is no signs of anything worsening. [name], [name] ([id number]) 4/7; the patient's wounds on her medial and lateral left leg continue to contract. We have been using a regular alginate. Last week she developed an area on the right medial lower leg which is probably a venous ulcer as well. 4/14; the wounds on her left medial and lateral lower leg continue to contract. Surface eschar. We have been using regular alginate. The area on the right medial lower leg is closed. We have been putting both legs under 4-layer contraction. The patient went back to see vein and vascular she had arterial studies done which were apparently "quite good" per the patient although I have not read their notes I have never felt she had an arterial issue. 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 This is been longstanding and refractory to exercise, leg elevation and longstanding use of compression wraps in our clinic as well as compression stockings on the times we have been able to get these to heal 4/21; we thought she actually might be close this week however she arrives in clinic with a lot of edema in her upper left calf and into her posterior thigh. This is been an intermittent problem here. She says the wrap fell down but it was replaced with a nurse visit on Monday. We are using calcium alginate to the wounds and the wound sizes there not terribly larger than last week but there is a lot more edema 4/28; again wound edges are smaller on both sides. Her edema is better controlled than last time. She is obtained her compression pumps from [employer]  although they have not been to her home to set these up. 5/5; left medial and left lateral both look stable. I am not sure the medial is any smaller. We have been using calcium alginate under 4-layer compression. oShe had an area on the right medial. This was eschared today. We have been wrapping this as well. She does not tolerate external compression stockings due to a history of various contact allergies. She has her compression pumps however the representative from the company is coming on her to show her how to use these tomorrow 5/19; patient with severe chronic venous insufficiency secondary to central venous disease. She had a stent placed in her left common iliac vein. She has done better since but still difficult to control wounds. She comes in today with nothing open on the right leg. Her areas on the left medial and left lateral are just about closed. We are using calcium alginate under 4-layer compression. She is using her external compression pumps at home She only has 15-20 support stockings. States she cannot get anything tighter than that on. 03/30/20-Patient returns at 1 week, the wounds on the left leg are both slightly bigger, the last week she was on 3 layer compression which started to slide down. She is starting to use her lymphedema pumps although she stated on 1 day her right ankle started to swell up and she have to stop that day. Unfortunately the open area seem to oscillate between improving to the point of healing and then flaring up all to do with effectiveness of compression or lack of due to the left leg topography not keeping the compression wraps from rolling down 6/2 patient comes in with a 15/20 mmHg stocking on the right leg.  She tells me that she developed a lot of swelling in her ankles she saw orthopedics she was felt to possibly be having a flare of pseudogout versus some other type of arthritis. She was put on steroids for a respiratory issue so that helps  with the inflammation. She has not been using the pumps all week. She thinks the left thigh is more swollen than usual and I would agree with that. She has an appointment with Dr. Donzetta Matters 9 days or so from now 6/9; both wounds on the left medial and left lateral are smaller. We have been using calcium alginate under compression. She does not have an open wound on the right leg she is using a stocking and her compression pumps things are going well. She has an appointment with Dr. Donzetta Matters with regards to her stent in the left common iliac vein 6/16; the wounds on the left medial and left lateral ankle continues to contract. The patient saw Dr. Donzetta Matters and I think he seems satisfied. Ordered follow-up venous reflux studies on both sides in September. Cautioned that she may need thigh-high stockings. She has been using calcium alginate under compression on the left and her own stocking on the right leg. She tells [place] there are no open wounds on the right Electronic Signature(s) Signed: 04/20/2020 4:26:25 PM By: Linton Ham MD Entered By: Linton Ham on 04/20/2020 13:32:29 [name], [name] ([id number]) -------------------------------------------------------------------------------- Physical Exam Details Patient Name: [name], [name]. Date of Service: 04/20/2020 12:45 PM Medical Record Number: [id number] Patient Account Number: [id number] Date of Birth/Sex: 07/21/1946 (74 y.o. F) Treating RN: Cornell Barman Primary Care Provider: [name] Other Clinician: Referring Provider: [name] Treating Provider/Extender: Tito Dine in Treatment: 74 Constitutional Sitting or standing Blood Pressure is within target range for patient.. Pulse regular and within target range for patient.Marland Kitchen Respirations regular, non- labored and within target range.. Temperature is normal and within the target range for the patient.Marland Kitchen appears in no distress. Notes Wound exam; on the medial side there is  surface debris that required removal with a #3 curette. Hemostasis with direct pressure. This cleaned up nicely on the lateral side the wound looked healthy. No debridement was required. Electronic Signature(s) Signed: 04/20/2020 4:26:25 PM By: Linton Ham MD Entered By: Linton Ham on 04/20/2020 13:34:00 Mehaffey, [name] ([id number]) -------------------------------------------------------------------------------- Physician Orders Details Patient Name: [name], [name]. Date of Service: 04/20/2020 12:45 PM Medical Record Number: [id number] Patient Account Number: [id number] Date of Birth/Sex: 1946/05/24 (74 y.o. F) Treating RN: Cornell Barman Primary Care Provider: [name] Other Clinician: Referring Provider: [name] Treating Provider/Extender: Tito Dine in Treatment: 92 Verbal / Phone Orders: No Diagnosis Coding Anesthetic (add to Medication List) Wound #5 Left,Medial Lower Leg o Topical Lidocaine 4% cream applied to wound bed prior to debridement (In Clinic Only). Wound #6 Left,Lateral Lower Leg o Topical Lidocaine 4% cream applied to wound bed prior to debridement (In Clinic Only). Skin Barriers/Peri-Wound Care Wound #5 Left,Medial Lower Leg o Triamcinolone Acetonide Ointment (TCA) - peri-wound Wound #6 Left,Lateral Lower Leg o Triamcinolone Acetonide Ointment (TCA) - peri-wound Primary Wound Dressing Wound #5 Left,Medial Lower Leg o Alginate Wound #6 Left,Lateral Lower Leg o Alginate Secondary Dressing Wound #5 Left,Medial Lower Leg o ABD pad Wound #6 Left,Lateral Lower Leg o ABD pad Dressing Change Frequency Wound #5 Left,Medial Lower Leg o Change dressing every week o Other: - Nurse visit Friday and Monday if needed. Wound #6 Left,Lateral Lower Leg o Change dressing every week   o Other: - Nurse visit Friday and Monday if needed. Follow-up Appointments Wound #5 Left,Medial Lower Leg o Return Appointment in 1  week. o Nurse Visit as needed Wound #6 Left,Lateral Lower Leg o Return Appointment in 1 week. o Nurse Visit as needed Edema Control Wound #5 Left,Medial Lower Leg o 3 Layer Compression System - Left Lower Extremity o Patient to wear own compression stockings - On Right o Compression Pump: Use compression pump on left lower extremity for 60 minutes, twice daily. o Compression Pump: Use compression pump on right lower extremity for 60 minutes, twice daily. Wound #6 Left,Lateral Lower Leg o 3 Layer Compression System - Left Lower Extremity JULIA, ALKHATIB ([id number]) o Patient to wear own compression stockings - On Right o Compression Pump: Use compression pump on left lower extremity for 60 minutes, twice daily. o Compression Pump: Use compression pump on right lower extremity for 60 minutes, twice daily. Electronic Signature(s) Signed: 04/20/2020 4:26:25 PM By: Linton Ham MD Signed: 04/20/2020 5:04:05 PM By: [name], [name], RN, CWS, [name], [name] Entered By: [name], [name], RN, CWS, Kim on 04/20/2020 13:29:16 [name], [name] ([id number]) -------------------------------------------------------------------------------- Problem List Details Patient Name: [name], [name]. Date of Service: 04/20/2020 12:45 PM Medical Record Number: [id number] Patient Account Number: [id number] Date of Birth/Sex: 03-04-1946 (74 y.o. F) Treating RN: Cornell Barman Primary Care Provider: [name] Other Clinician: Referring Provider: [name] Treating Provider/Extender: Tito Dine in Treatment: 28 Active Problems ICD-10 Encounter Code Description Active Date MDM Diagnosis L97.221 Non-pressure chronic ulcer of left calf limited to breakdown of skin 01/07/2019 No Yes I89.0 Lymphedema, not elsewhere classified 12/10/2018 No Yes I87.332 Chronic venous hypertension (idiopathic) with ulcer and inflammation of 12/09/2019 No Yes left lower extremity I83.009 Varicose veins of  unspecified lower extremity with ulcer of unspecified 04/13/2020 No Yes site I82.592 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 12/09/2019 No Yes lower extremity Inactive Problems ICD-10 Code Description Active Date Inactive Date L97.818 Non-pressure chronic ulcer of other part of right lower leg with other specified 10/14/2019 10/14/2019 severity Resolved Problems ICD-10 Code Description Active Date Resolved Date L97.211 Non-pressure chronic ulcer of right calf limited to breakdown of skin 02/10/2020 02/10/2020 Electronic Signature(s) Signed: 04/20/2020 4:26:25 PM By: Linton Ham MD Entered By: Linton Ham on 04/20/2020 13:31:00 [name] ([id number]) -------------------------------------------------------------------------------- Progress Note Details Patient Name: QUINETTA, SHILLING. Date of Service: 04/20/2020 12:45 PM Medical Record Number: [id number] Patient Account Number: [id number] Date of Birth/Sex: 05-29-1946 (74 y.o. F) Treating RN: Cornell Barman Primary Care Provider: [name] Other Clinician: Referring Provider: [name] Treating Provider/Extender: Tito Dine in Treatment: 61 Subjective History of Present Illness (HPI) Pleasant 74 year old with history of chronic venous insufficiency. No diabetes or peripheral vascular disease. Left ABI 1.29. Questionable history of left lower extremity DVT. She developed a recurrent ulceration on her left lateral calf in December 2015, which she attributes to poor diet and subsequent lower extremity edema. She underwent endovenous laser ablation of her left greater saphenous vein in 2010. She underwent laser ablation of accessory branch of left GSV in April 2016 by Dr. Kellie Simmering at [facility]. She was previously wearing [place] boots, which she tolerated well. Tolerating 2 layer compression and cadexomer iodine. She returns to clinic for follow-up and is without new complaints. She denies  any significant pain at this time. She reports persistent pain with pressure. No claudication or ischemic rest pain. No fever or chills. No drainage. READMISSION 11/13/16; this is  a 74 year old woman who is not a diabetic. She is here for a review of a painful area on her left medial lower extremity. I note that she was seen here previously last year for wound I believe to be in the same area. At that time she had undergone previously a left greater saphenous vein ablation by Dr. Kellie Simmering and she had a ablation of the anterior accessory branch of the left greater saphenous vein in March 2016. Seeing that the wound actually closed over. In reviewing the history with her today the ulcer in this area has been recurrent. She describes a biopsy of this area in 2009 that only showed stasis physiology. 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 Do not see that she has ever had arterial studies in the left leg. She tells me she has a follow-up with Dr. Kellie Simmering in roughly 10 days In any case she developed the reopening of this area roughly a month ago. On the background of this she describes rapidly increasing edema which has responded to Lasix 40 mg and metolazone 2.5 mg as well as the patient's lymph massage. She has been told she has both venous insufficiency and lymphedema but she cannot tolerate compression stockings 11/28/16; the patient saw Dr. Kellie Simmering recently. Per the patient he did arterial Dopplers in the office that did not show evidence of arterial insufficiency, per the patient he stated "treat this like an ordinary venous ulcer". She also saw her dermatologist Dr. Ronnald Ramp who felt that this was more of a vascular ulcer. In general things are improving although she arrives today with  increasing bilateral lower extremity edema with weeping a deeper fluid through the wound on the left medial leg compatible with some degree of lymphedema 12/04/16; the patient's wound is fully epithelialized but I don't think fully healed. We will do another week of depression with Promogran and TCA however I suspect we'll be able to discharge her next week. This is a very unusual-looking wound which was initially a figure-of-eight type wound lying on its side surrounded by petechial like hemorrhage. She has had venous ablation on this side. She apparently does not have an arterial issue per Dr. Kellie Simmering. She saw her dermatologist thought it was "vascular". Patient is definitely going to need ongoing compression and I talked about this with her today she will go to elastic therapy after she leaves here next week 12/11/16; the patient's wound is not completely closed today. She has surrounding scar tissue and in further discussion with the patient it would appear that she had ulcers in this area in 2009 for a prolonged period of time ultimately requiring a punch biopsy of this area that only showed venous insufficiency. I did not previously pickup on this part of the history from the patient. 12/18/16; the patient's wound is completely epithelialized. There is no open area here. She has significant bilateral venous insufficiency with secondary lymphedema to a mild-to-moderate degree she does not have compression stockings.. She did not say anything to me when I was in the room, she told our intake nurse that she was still having pain in this area. This isn't unusual recurrent small open area. She is going to go to elastic therapy to obtain compression stockings. 12/25/16; the patient's wound is fully  epithelialized. There is no open area here. The patient describes some continued episodic discomfort in this area medial left calf. However everything looks fine and healed here. She is been to elastic therapy  and caught herself 15-20 mmHg stockings, they apparently were having trouble getting 20-30 mm stockings in her size 01/22/17; this is a patient we discharged from the clinic a month ago. She has a recurrent open wound on her medial left calf. She had 15 mm support stockings. I told her I thought she needed 20-30 mm compression stockings. She tells me that she has been ill with hospitalization secondary to asthma and is been found to have severe hypokalemia likely secondary to a combination of Lasix and metolazone. This morning she noted blistering and leaking fluid on the posterior part of her left leg. She called our intake nurse urgently and we was saw her this afternoon. She has not had any real discomfort here. I don't know that she's been wearing any stockings on this leg for at least 2-3 days. ABIs in this clinic were 1.21 on the right and 1.3 on the left. She is previously seen vascular surgery who does not think that there is a peripheral arterial issue. 01/30/17; Patient arrives with no open wound on the left leg. She has been to elastic therapy and obtained 20-31mmhg below knee stockings and she has one on the right leg today. READMISSION 02/19/18; this Doscher is a now 74 year old patient we've had in this clinic perhaps 3 times before. I had last looked at her from January 07 December 2016 with an area on the medial left leg. We discharged her on 12/25/16 however she had to be readmitted on 01/22/17 with a recurrence. I have in my notes that we discharged her on 20-30 mm stockings although she tells me she was only wearing support hose because she cannot get stockings on predominantly related to her cervical spine surgery/issues. She has had previous ablations done by vein and vascular in [place] including a great saphenous vein ablation on the left with an anterior accessory branch ablation I think both of these were in 2016. On one of the previous visit she had a biopsy noted 2009 that was  negative. She is not felt to have an arterial issue. She is not a diabetic. She does have a history of obstructive sleep apnea hypertension asthma as well as chronic venous insufficiency and lymphedema. On this occasion she noted 2 dry scaly patch on her left leg. She tried to put lotion on this it didn't really help. There were 2 open areas.the patient has been seeing her primary physician from 02/05/18 through 02/14/18. She had Unna boots applied. The superior wound now on the lateral left leg has closed but she's had one wound that remains open on the lateral left leg. This is not the same spot as we dealt with in 2018. ABIs in this clinic were 1.3 bilaterally JOELL, BUERGER ([id number]) 02/26/18; patient has a small wound on the left lateral calf. Dimensions are down. She has chronic venous insufficiency and lymphedema. 03/05/18; small open area on the left lateral calf. Dimensions are down. Tightly adherent necrotic debris over the surface of the wound which was difficult to remove. Also the dressing [over collagen] stuck to the wound surface. This was removed with some difficulty as well. Change the primary dressing to Hydrofera Blue ready 03/12/18; small open area on the left lateral calf. Comes in with tightly adherent surface eschar as well as some adherent  Hydrofera Blue. 03/19/18; open area on the left lateral calf. Again adherent surface eschar as well as some adherent Hydrofera Blue nonviable subcutaneous tissue. She complained of pain all week even with the reduction from 4-3 layer compression I put on last week. Also she had an increase in her ankle and calf measurements probably related to the same thing. 03/26/18; open area on the left lateral calf. A very small open area remains here. We used silver alginate starting last week as the Hydrofera Blue seem to stick to the wound bed. In using 4-layer compression 04/02/18; the open area in the left lateral calf at some adherent slough which I  removed there is no open area here. We are able to transition her into her own compression stocking. Truthfully I think this is probably his support hose. However this does not maintain skin integrity will be limited. She cannot put over the toe compression stockings on because of neck problems hand problems etc. She is allergic to the lining layer of juxta lites. We might be forced to use extremitease stocking should this fail READMIT 11/24/2018 Patient is now a 74 year old woman who is not a diabetic. She has been in this clinic on at least 3 previous occasions largely with recurrent wounds on her left leg secondary to chronic venous insufficiency with secondary lymphedema. Her situation is complicated by inability to get stockings on and an allergy to neoprene which is apparently a component and at least juxta lites and other stockings. As a result she really has not been wearing any stockings on her legs. She tells [place] that roughly 2 or 3 weeks ago she started noticing a stinging sensation just above her ankle on the left medial aspect. She has been diagnosed with pseudogout and she wondered whether this was what she was experiencing. She tried to dress this with something she bought at the store however subsequently it pulled skin off and now she has an open wound that is not improving. She has been using Vaseline gauze with a cover bandage. She saw her primary doctor last week who put an Haematologist on her. ABIs in this clinic was 1.03 on the left 2/12; the area is on the left medial ankle. Odd-looking wound with what looks to be surface epithelialization but a multitude of small petechial openings. This clearly not closed yet. We have been using silver alginate under 3 layer compression with TCA 2/19; the wound area did not look quite as good this week. Necrotic debris over the majority of the wound surface which required debridement. She continues to have a multitude of what looked to be small  petechial openings. She reminds [place] that she had a biopsy on this initially during her first outbreak in 2015 in [place] dermatology. She expresses concern about this being a possible melanoma. 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 I assured her that this does not look anything like melanoma. Except for the petechial reaction it does look like a venous insufficiency area and she certainly has evidence of this on both sides 2/26; a difficult area on the left medial ankle. The patient clearly has chronic venous hypertension with some degree of lymphedema. The odd thing about the area is the small petechial hemorrhages. I am not really sure how to explain this. This was present last time and this is not a compression injury. We have been using Hydrofera Blue which I changed  to last week 3/4; still using Hydrofera Blue. Aggressive debridement today. She does not have known arterial issues. She has seen Dr. Kellie Simmering at [facility] vein and vascular and and has an ablation on the left. [Anterior accessory branch of the greater saphenous]. From what I remember they did not feel she had an arterial issue. The patient has had this area biopsied in 2009 at [facility] dermatology and by her recollection they said this was "stasis". She is also follow-up with dermatology locally who thought that this was more of a vascular issue 3/11; using Hydrofera Blue. Aggressive debridement today. She does not have an arterial issue. We are using 3 layer compression although we may need to go to 4. The patient has been in for multiple changes to her wrap since I last saw her a week ago. She says that the area was leaking. I do not have too much more information on what was found 01/19/19 on evaluation today patient was actually being seen for a nurse visit when unfortunately she had the area on her left lateral lower extremity as well  as weeping from the right lower extremity that became apparent. Therefore we did end up actually seeing her for a full visit with myself. She is having some pain at this site as well but fortunately nothing too significant at this point. No fevers, chills, nausea, or vomiting noted at this time. 3/18-Patient is back to the clinic with the left leg venous leg ulcer, the ulcer is larger in size, has a surface that is densely adherent with fibrinous tissue, the Hydrofera Blue was used but is densely adherent and there was difficulty in removing it. The right lower extremity was also wrapped for weeping edema. Patient has a new area over the left lateral foot above the malleolus that is small and appears to have no debris with intact surrounding skin. Patient is on increased dose of Lasix also as a means to edema management 3/25; the patient has a nonhealing venous ulcer on the medial left leg and last week developed a smaller area on the lateral left calf. We have been using Hydrofera Blue with a contact layer. 4/1; no major change in these wounds areas. Left medial and more recently left lateral calf. I tried Iodoflex last week to aid in debridement she did not tolerate this. She stated her pain was terrible all week. She took the top layer of the 4 layer compression off. 4/8; the patient actually looks somewhat better in terms of her more prominent left lateral calf wound. There is some healthy looking tissue here. She is still complaining of a lot of discomfort. 4/15; patient in a lot of pain secondary to sciatica. She is on a prednisone taper prescribed by her primary physician. She has the 2 areas one on the left medial and more recently a smaller area on the left lateral calf. Both of these just above the malleoli 4/22; her back pain is better but she still states she is very uncomfortable and now feels she is intolerant to the [employer]. No real change in the wounds we have been using Sorbact.  She has been previously intolerant to Iodoflex. There is not a lot of option about what we can use to debride this wound under compression that she no doubt needs. sHe states Ultram no longer works for her pain 4/29; no major change in the wounds slightly increased depth. Surface on the original medial wound perhaps somewhat improved however the more recent area  on the lateral left ankle is 100% covered in very adherent debris we have been using Sorbact. She tolerates 4 layer compression well and her edema control is a lot better. She has not had to come in for a nurse check 5/6; no major change in the condition of the wounds. She did consent to debridement today which was done with some difficulty. Continuing Sorbact. She did not tolerate Iodoflex. She was in for a check of her compression the day after we wrapped her last week this was adjusted but nothing much was found 5/13; no major change in the condition or area of the wounds. I was able to get a fairly aggressive debridement done on the lateral left leg wound. Even using Sorbact under compression. She came back in on Friday to have the wrap changed. She says she felt uncomfortable on the lateral aspect of her ankle. She has a long history of chronic venous insufficiency including previous ablation surgery on this side. 5/20-Patient returns for wounds on left leg with both wounds covered in slough, with the lateral leg wound larger in size, she has been in 3 layer compression and felt more comfortable, she describes pain in ankle, in leg and pins and needles in foot, and is about to try Pamelor for this 6/3; wounds on the left lateral and left medial leg. The area medially which is the most recent of the 2 seems to have had the largest increase in [place], [name]. ([id number]) dimensions. We have been using Sorbac to try and debride the surface. She has been to see orthopedics they apparently did a plain x-ray that was indeterminant. Diagnosed  her with neuropathy and they have ordered an MRI to determine if there is underlying osteomyelitis. This was not high on my thought list but I suppose it is prudent. We have advised her to make an appointment with vein and vascular in [place]. She has a history of a left greater saphenous and accessory vein ablations I wonder if there is anything else that can be done from a surgical point of view to help in these difficult refractory wounds. We have previously healed this wound on one occasion but it keeps on reopening [medial side] 6/10; deep tissue culture I did last week I think on the left medial wound showed both moderate E. coli and moderate staph aureus [MSSA]. She is going to require antibiotics and I have chosen Augmentin. We have been using Sorbact and we have made better looking wound surface on both sides but certainly no improvement in wound area. She was back in last Friday apparently for a dressing changes the wrap was hurting her outer left ankle. She has not managed to get a hold of vein and vascular in [place]. We are going to have to make her that appointment 6/17; patient is tolerating the Augmentin. She had an MRI that I think was ordered by orthopedic surgeon this did not show osteomyelitis or an abscess did suggest cellulitis. We have been using Sorbact to the lateral and medial ankles. We have been trying to arrange a follow-up appointment with vein and vascular in [place] or did her original ablations. We apparently an area sent the request to vein and vascular in [facility] 6/24; patient has completed the Augmentin. We do not yet have a vein and vascular appointment in [place]. I am not sure what the issue is here we have asked her to call tomorrow. We are using Sorbact. Making some improvements and especially the medial  wound. Both surfaces however look better medial and lateral. 7/1; the patient has been in contact with vein and vascular in [place] but has  not yet received an appointment. Using Sorbact we have gradually improve the wound surface with no improvement in surface area. She is approved for Apligraf but the wound surface still is not completely viable. She has not had to come in for a dressing change 7/8; the patient has an appointment with vein and vascular on 7/31 which is a Friday afternoon. She is concerned about getting back here for [place] to dress her wounds. I think it is important to have them goal for her venous reflux/history of ablations etc. to see if anything else can be done. She apparently tested positive for 1 of the blood tests with regards to lupus and saw a rheumatologist. He has raised the issue of vasculitis again. I have had this thought in the past however the evidence seems overwhelming that this is a venous reflux etiology. If the rheumatologist tells me there is clinical and laboratory investigation is positive for lupus I will rethink this. 7/15; the patient's wound surfaces are quite a bit better. The medial area which was her original wound now has no depth although the lateral wound which was the more recent area actually appears larger. Both with viable surfaces which is indeed better. Using Sorbact. I wanted to use Apligraf on her however there is the issue of the vein and vascular appointment on 7/31 at 2:00 in the afternoon which would not allow her to get back to be rewrapped and they would no doubt remove the graft 7/22; the patient's wound surfaces have moderate amount of debris although generally look better. The lateral one is larger with 2 small satellite areas superiorly. We are waiting for her vein and vascular appointment on 7/31. She has been approved for Apligraf which I would like to use after th 7/29; wound surfaces have improved no debridement is required we have been using Sorbact. She sees vein and vascular on Friday with this so question of whether anything can be done to lessen the likelihood  of recurrence and/or speed the healing of these areas. She is already had previous ablations. She no doubt has severe venous hypertension 8/5-Patient returns at 1 week, she was in [place] for 3 days by her podiatrist, we have been using so backed to the wound, she has increased pain in both the wounds on the left lower leg especially the more distal one on the lateral aspect 8/12-Patient returns at 1 week and she is agreeable to having debridement in both wounds on her left leg today. We have been using Sorbact, and vascular studies were reviewed at last visit 8/19; the patient arrives with her wounds fairly clean and no debridement is required. We have used Sorbact which is really done a nice job in cleaning up these very difficult wound surfaces. The patient saw Dr. Donzetta Matters of vascular surgery on 7/31. He did not feel that there was an arterial component. He felt that her treated greater saphenous vein is adequately addressed and that the small saphenous vein did not appear to be involved significantly. She was also noted to have deep venous reflux which is not treatable. Dr. Donzetta Matters mentioned the possibility of a central obstructive component leading to reflux and he offered her central venography. She wanted to discuss this or think about it. I have urged her to go ahead with this. She has had recurrent difficult wounds in  these areas which do heal but after months in the clinic. If there is anything that can be done to reduce the likelihood of this I think it is worth it. 9/2 she is still working towards getting follow-up with Dr. Donzetta Matters to schedule her CT. Things are quite a bit worse venography. I put Apligraf on 2 weeks ago on both wounds on the medial and lateral part of her left lower leg. She arrives in clinic today with 3 superficial additional wounds above the area laterally and one below the wound medially. She describes a lot of discomfort. I think these are probably wrapped injuries.  Does not look like she has cellulitis. 07/20/2019 on evaluation today patient appears to be doing somewhat poorly in regard to her lower extremity ulcers. She in fact showed signs of erythema in fact we may even be dealing with an infection at this time. Unfortunately I am unsure if this is just infection or if indeed there may be some allergic reaction that occurred as a result of the Apligraf application. With that being said that would be unusual but nonetheless not impossible in this patient is one who is unfortunately allergic to quite a bit. Currently we have been using the Sorbact which seems to do as well as anything for her. I do think we may want to obtain a culture today to see if there is anything showing up there that may need to be addressed. 9/16; noted that last week the wounds look worse in 1 week follow-up of the Apligraf. Using Sorbact as of 2 days ago. She arrives with copious amounts of drainage and new skin breakdown on the back of the left calf. The wounds arm more substantial bilaterally. There is a fair amount of swelling in the left calf no overt DVT there is edema present I think in the left greater than right thigh. She is supposed to go on 9/28 for CT venography. The wounds on the medial and lateral calf are worse and she has new skin breakdown posteriorly at least new for me. This is almost developing into a circumferential wound area The Apligraf was taken off last week which I agree with things are not going in the right direction a culture was done we do not have that back yet. She is on Augmentin that she started 2 days ago 9/23; dressing was changed by her nurses on Monday. In general there is no improvement in the wound areas although the area looks less angry than last week. She did get Augmentin for MSSA cultured on the 14th. She still appears to have too much swelling in the left leg even with 3 layer compression 9/30; the patient underwent her procedure on 9/28  by Dr. Donzetta Matters at vascular and vein specialist. She was discovered to have the common iliac vein measuring 12.2 mm but at the level of L4-L5 measured 3 mm. After stenting it measured 10 mm. 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 Rouleaux flow in the common femoral and femoral vein was observed much improved after stenting. We are using silver alginate to the wounds on the medial and lateral ankle on the left. 4 layer compression 10/7; the patient had fluid swelling around her knee and 4 layer compression. At the advice of vein and vascular this was reduced to 3 layer which she is tolerating better. We have been using silver alginate under 3 layer compression since last Friday 10/14; arrives with the areas on the left ankle  looking a lot better. Inflammation in the area also a lot better. She came in for a nurse check on 10/9 10/21; continued nice improvement. Slight improvements in surface area of both the medial and lateral wounds on the left. A lot of the satellite lesions in the weeping erythema around these from stasis dermatitis is resolved. We have been using silver alginate [name], [name] ([id number]) 10/28; general improvement in the entire wound areas although not a lot of change in dimensions the wound certainly looks better. There is a lot less in terms of venous inflammation. Continue silver alginate this week however look towards Hydrofera Blue next week 11/4; very adherent debris on the medial wound left wound is not as bad. We have been using silver alginate. Change to [facility] today 11/11; very adherent debris on both wound areas. She went to vein and vascular last week and follow-up they put in [place] boot on this today. He says the [facility] was adherent. Wound is definitely not as good as last week. Especially on the left there the satellite lesions look more prominent 11/18; absolutely no better. erythema on lateral aspect with tenderness. 09/30/2019  on evaluation today patient appears to actually be doing better. Dr. Dellia Nims did put her on doxycycline last week which I do believe has helped her at this point. Fortunately there is no signs of active infection at this time. No fevers, chills, nausea, vomiting, or diarrhea. I do believe he may want extend the doxycycline for 7 additional days just to ensure everything does completely cleared up the patient is in agreement with that plan. Otherwise she is going require some sharp debridement today 12/2; patient is completing a 2-week course of doxycycline. I gave her this empirically for inflammation as well as infection when I last saw her 2 weeks ago. All of this seems to be better. She is using silver alginate she has the area on the medial aspect of the larger area laterally and the 2 small satellite regions laterally above the major wound. 12/9; the patient's wound on the left medial and left lateral calf look really quite good. We have been using silver alginate. She saw vein and vascular in follow-up on 10/09/2019. She has had a previous left greater saphenous vein ablation by Dr. Oscar La in 2016. More recently she underwent a left common iliac vein stent by Dr. 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 The swelling is improved and certainly the wounds have improved. The patient shows [place] today area on the right medial calf there is almost no wound but leaking lymphedema. She says she start this started 3 or 4 days ago. She did not traumatize it. It is not painful. She does not wear compression on that side 12/16; the patient continues to do well laterally. Medially still requiring debridement. The area on the right calf did not materialize to anything and is not currently open. We wrapped this last time. She has support stockings for that leg although I am not sure they are going to provide adequate compression 12/23; the lateral wound looks stable. Medially still requiring debridement  for tightly adherent fibrinous debris. We've been using silver alginate. Surface area not any different 12/30; neither wound is any better with regards to surface and the area on the left lateral is larger. I been using silver alginate to the left lateral which look quite good last week and Sorbact to the left medial 11/11/2019. Lateral wound area actually looks better and somewhat  smaller. Medial still requires a very aggressive debridement today. We have been using Sorbact on both wound areas 1/13; not much better still adherent debris bilaterally. I been using Sorbact. She has severe venous hypertension. Probably some degree of dermal fibrosis distally. I wonder whether tighter compression might help and I am going to try that today. We also need to work on the bioburden 1/20; using Sorbact. She has severe venous hypertension status post stent placement for pelvic vein compression. We applied gentamicin last time to see if we could reduce bioburden I had some discussion with her today about the use of pentoxifylline. This is occasionally used in this setting for wounds with refractory venous insufficiency. However this interacts with Plavix. She tells me that she was put on this after stent placement for 3 months. She will call Dr. Claretha Cooper office to discuss 1/27; we are using gentamicin under Sorbact. 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 She has a stent. Wound medially is measuring smaller this week. Laterally measuring slightly larger although she has some satellite lesions superiorly 2/3; gentamicin under Sorbact under 4-layer compression. 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 She has a stent on Plavix. Her wounds are measuring smaller this week. More substantially laterally where there is a satellite lesion superiorly. 2/10; gentamicin under Sorbac. 4-layer compression. Patient communicated with Dr. Donzetta Matters at [facility] vascular in  [place]. He is okay with the patient coming off Plavix I will therefore start her on pentoxifylline for a 1 month trial. In general her wounds look better today. I had some concerns about swelling in the left thigh however she measures 61.5 on the right and 63 on the mid thigh which does not suggest there is any difficulty. The patient is not describing any pain. 2/17; gentamicin under Sorbac 4-layer compression. She has been on pentoxifylline for 1 week and complains of loose stool. No nausea she is eating and drinking well 2/24; the patient apparently came in 2 days ago for a nurse visit when her wrap fell down. Both areas look a little worse this week macerated medially and satellite lesions laterally. Change to silver alginate today 3/3; wounds are larger today especially medially. She also has more swelling in her foot lower leg and I even noted some swelling in her posterior thigh which is tender. I wonder about the patency of her stent. Fortuitously she sees Dr. Claretha Cooper group on Friday 3/10; Mrs. Niznik was seen by vein and vascular on 3/5. The patient underwent ultrasound. There was no evidence of thrombosis involving the IVC no evidence of thrombosis involving the right common iliac vein there is no evidence of thrombosis involving the right external iliac vein the left external vein is also patent. The right common iliac vein stent appears patent bilateral common femoral veins are compressible and appear patent. I was concerned about the left common iliac stent however it looks like this is functional. She has some edema in the posterior thigh that was tender she still has that this week. I also note they had trouble finding the pulses in her left foot and booked her for an ABI baseline in 4 weeks. She will follow up in 6 months for repeat IVC duplex. The patient stopped the pentoxifylline because of diarrhea. It does not look like that was being effective in any case. I have advised her  to go back on her aspirin 81 mg tablet, vascular it also suggested this 3/17; comes in today with her wound  surfaces a lot better. The excoriations from last week considerably better probably secondary to the TCA. We have been using silver alginate 3/24; comes in today with smaller wounds both medially and laterally. Both required debridement. There are 2 small satellite areas superiorly laterally. She also has a very odd bandlike area in the mid calf almost looking like there was a weakness in the wrap in a localized area. I would write this off as being this however anteriorly she has a small raised ballotable area that is very tender almost reminiscent of an abscess but there was no obvious purulent surface to it. 02/04/20 upon evaluation today patient appears to be doing fairly well in regard to her wounds today. Fortunately there is no signs of active infection at this time. No fevers, chills, nausea, vomiting, or diarrhea. She has been tolerating the dressing changes without complication. Fortunately I feel like she is showing signs of improvement although has been sometime since have seen her. Nonetheless the area of concern that Dr. Dellia Nims had last week where she had possibly an area of the wrap that was we can allow the leg to bulge appears to be doing significantly better today there is no signs of anything worsening. [name], [name] ([id number]) 4/7; the patient's wounds on her medial and lateral left leg continue to contract. We have been using a regular alginate. Last week she developed an area on the right medial lower leg which is probably a venous ulcer as well. 4/14; the wounds on her left medial and lateral lower leg continue to contract. Surface eschar. We have been using regular alginate. The area on the right medial lower leg is closed. We have been putting both legs under 4-layer contraction. The patient went back to see vein and vascular she had arterial studies done which  were apparently "quite good" per the patient although I have not read their notes I have never felt she had an arterial issue. 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 This is been longstanding and refractory to exercise, leg elevation and longstanding use of compression wraps in our clinic as well as compression stockings on the times we have been able to get these to heal 4/21; we thought she actually might be close this week however she arrives in clinic with a lot of edema in her upper left calf and into her posterior thigh. This is been an intermittent problem here. She says the wrap fell down but it was replaced with a nurse visit on Monday. We are using calcium alginate to the wounds and the wound sizes there not terribly larger than last week but there is a lot more edema 4/28; again wound edges are smaller on both sides. Her edema is better controlled than last time. She is obtained her compression pumps from [employer] although they have not been to her home to set these up. 5/5; left medial and left lateral both look stable. I am not sure the medial is any smaller. We have been using calcium alginate under 4-layer compression. She had an area on the right medial. This was eschared today. We have been wrapping this as well. She does not tolerate external compression stockings due to a history of various contact allergies. She has her compression pumps however the representative from the company is coming on her to show her how to use these tomorrow 5/19; patient with severe chronic venous insufficiency secondary to central venous disease. She had a stent placed  in her left common iliac vein. She has done better since but still difficult to control wounds. She comes in today with nothing open on the right leg. Her areas on the left medial and left lateral are just about closed. We are using calcium alginate under 4-layer compression. She  is using her external compression pumps at home She only has 15-20 support stockings. States she cannot get anything tighter than that on. 03/30/20-Patient returns at 1 week, the wounds on the left leg are both slightly bigger, the last week she was on 3 layer compression which started to slide down. She is starting to use her lymphedema pumps although she stated on 1 day her right ankle started to swell up and she have to stop that day. Unfortunately the open area seem to oscillate between improving to the point of healing and then flaring up all to do with effectiveness of compression or lack of due to the left leg topography not keeping the compression wraps from rolling down 6/2 patient comes in with a 15/20 mmHg stocking on the right leg. She tells me that she developed a lot of swelling in her ankles she saw orthopedics she was felt to possibly be having a flare of pseudogout versus some other type of arthritis. She was put on steroids for a respiratory issue so that helps with the inflammation. She has not been using the pumps all week. She thinks the left thigh is more swollen than usual and I would agree with that. She has an appointment with Dr. Donzetta Matters 9 days or so from now 6/9; both wounds on the left medial and left lateral are smaller. We have been using calcium alginate under compression. She does not have an open wound on the right leg she is using a stocking and her compression pumps things are going well. She has an appointment with Dr. Donzetta Matters with regards to her stent in the left common iliac vein 6/16; the wounds on the left medial and left lateral ankle continues to contract. The patient saw Dr. Donzetta Matters and I think he seems satisfied. Ordered follow-up venous reflux studies on both sides in September. Cautioned that she may need thigh-high stockings. She has been using calcium alginate under compression on the left and her own stocking on the right leg. She tells [place] there are no open  wounds on the right Objective Constitutional Sitting or standing Blood Pressure is within target range for patient.. Pulse regular and within target range for patient.Marland Kitchen Respirations regular, non- labored and within target range.. Temperature is normal and within the target range for the patient.Marland Kitchen appears in no distress. Vitals Time Taken: 1:00 PM, Height: 63 in, Weight: 224.7 lbs, BMI: 39.8, Temperature: 98.3 F, Pulse: 78 bpm, Respiratory Rate: 18 breaths/min, Blood Pressure: 138/70 mmHg. General Notes: Wound exam; on the medial side there is surface debris that required removal with a #3 curette. Hemostasis with direct pressure. This cleaned up nicely on the lateral side the wound looked healthy. No debridement was required. Integumentary (Hair, Skin) Wound #5 status is Open. Original cause of wound was Gradually Appeared. The wound is located on the Left,Medial Lower Leg. The wound measures 1.1cm length x 0.8cm width x 0.1cm depth; 0.691cm^2 area and 0.069cm^3 volume. There is Fat Layer (Subcutaneous Tissue) Exposed exposed. There is no tunneling or undermining noted. There is a medium amount of serous drainage noted. The wound margin is flat and intact. There is medium (34-66%) pink granulation within the wound bed. There is  a medium (34-66%) amount of necrotic tissue within the wound bed including Adherent Slough. Wound #6 status is Open. Original cause of wound was Gradually Appeared. The wound is located on the Left,Lateral Lower Leg. The wound measures 0.6cm length x 1.1cm width x 0.1cm depth; 0.518cm^2 area and 0.052cm^3 volume. There is Fat Layer (Subcutaneous Tissue) Exposed exposed. There is no tunneling or undermining noted. There is a medium amount of serous drainage noted. The wound margin is flat and intact. There is medium (34-66%) pink granulation within the wound bed. There is a medium (34-66%) amount of necrotic tissue within the wound bed including Adherent Slough. 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 There was a Excisional Skin/Subcutaneous Tissue Debridement with a total area of 0.88 sq cm performed by [name] Dillon, MD. With the following instrument(s): Curette to remove Viable and Non-Viable tissue/material. Material removed includes Subcutaneous Tissue, Slough, and Biofilm after achieving pain control using Lidocaine. No specimens were taken. A time out was conducted at 13:27, prior to the start of the procedure. A Minimum amount of bleeding was controlled with Pressure. The procedure was tolerated well. Post Debridement Measurements: 1.1cm length x 0.8cm width x 0.2cm depth; 0.138cm^3 volume. Character of Wound/Ulcer Post Debridement is stable. Post procedure Diagnosis Wound #5: Same as Pre-Procedure Pre-procedure diagnosis of Wound #5 is a Lymphedema located on the Left,Medial Lower Leg . There was a Three Layer Compression Therapy Procedure with a pre-treatment ABI of 1 by Cornell Barman, RN. Post procedure Diagnosis Wound #5: Same as Pre-Procedure Plan Anesthetic (add to Medication List): Wound #5 Left,Medial Lower Leg: Topical Lidocaine 4% cream applied to wound bed prior to debridement (In Clinic Only). Wound #6 Left,Lateral Lower Leg: Topical Lidocaine 4% cream applied to wound bed prior to debridement (In Clinic Only). Skin Barriers/Peri-Wound Care: Wound #5 Left,Medial Lower Leg: Triamcinolone Acetonide Ointment (TCA) - peri-wound Wound #6 Left,Lateral  Lower Leg: Triamcinolone Acetonide Ointment (TCA) - peri-wound Primary Wound Dressing: Wound #5 Left,Medial Lower Leg: Alginate Wound #6 Left,Lateral Lower Leg: Alginate Secondary Dressing: Wound #5 Left,Medial Lower Leg: ABD pad Wound #6 Left,Lateral Lower Leg: ABD pad Dressing Change Frequency: Wound #5 Left,Medial Lower Leg: Change dressing every week Other: - Nurse visit Friday and Monday if needed. Wound #6 Left,Lateral Lower Leg: Change dressing every week Other: - Nurse visit Friday and Monday if needed. Follow-up Appointments: Wound #5 Left,Medial Lower Leg: Return Appointment in 1 week. Nurse Visit as needed Wound #6 Left,Lateral Lower Leg: Return Appointment in 1 week. VINETTA, BRACH ([id number]) Nurse Visit as needed Edema Control: Wound #5 Left,Medial Lower Leg: 3 Layer Compression System - Left Lower Extremity Patient to wear own compression stockings - On Right Compression Pump: Use compression pump on left lower extremity for 60 minutes, twice daily. Compression Pump: Use compression pump on right lower extremity for 60 minutes, twice daily. Wound #6 Left,Lateral Lower Leg: 3 Layer Compression System - Left Lower Extremity Patient to wear own compression stockings - On Right Compression Pump: Use compression pump on left lower extremity for 60 minutes, twice daily. Compression Pump: Use compression pump  on right lower extremity for 60 minutes, twice daily. 1. Continue with calcium alginate under compression 2. She has her compression pumps that she is using 3. The refractory wounds on the left medial and left lateral ankle area are both smaller. Hopefully progressing towards closure Electronic Signature(s) Signed: 04/20/2020 4:26:25 PM By: Linton Ham MD Entered By: Linton Ham on 04/20/2020 13:34:51 Kundert, [name] ([id number]) -------------------------------------------------------------------------------- SuperBill Details Patient Name: JILLIANNE, GAMINO. Date of Service: 04/20/2020 Medical Record Number: [id number] Patient Account Number: [id number] Date of Birth/Sex: 02-13-1946 (74 y.o. 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 Facility Procedures CPT4 Code: [id number] Description: 88719 - DEB SUBQ TISSUE 20 SQ CM/< Modifier: Quantity: 1 CPT4 Code: Description: ICD-10 Diagnosis Description L97.221 Non-pressure chronic ulcer of left calf limited to breakdown of skin Modifier: Quantity: Physician Procedures CPT4 Code: [id number] Description: 11042 - WC PHYS SUBQ TISS 20 SQ CM Modifier: Quantity: 1 CPT4 Code: Description: ICD-10 Diagnosis Description L97.221 Non-pressure chronic ulcer of left calf limited to breakdown of skin Modifier: Quantity: Electronic Signature(s) Signed: 04/20/2020 4:26:25 PM By: Linton Ham MD Entered By: Linton Ham on 04/20/2020 13:35:08

## 2020-04-25 ENCOUNTER — Other Ambulatory Visit: Payer: Self-pay

## 2020-04-25 DIAGNOSIS — L97221 Non-pressure chronic ulcer of left calf limited to breakdown of skin: Secondary | ICD-10-CM | POA: Diagnosis not present

## 2020-04-26 NOTE — Progress Notes (Signed)
[name], [name] ([id number]) Visit Report for 04/25/2020 Arrival Information Details Patient Name: [name], [name]. Date of Service: 04/25/2020 4:15 PM Medical Record Number: [id number] Patient Account Number: [id number] Date of Birth/Sex: 08-28-1946 (74 y.o. F) Treating RN: Cornell Barman Primary Care Iyauna Sing: [name] Other Clinician: Referring Trequan Marsolek: [name] Treating Charron Coultas/Extender: Melburn Hake, HOYT Weeks in Treatment: 71 Visit Information History Since Last Visit Added or deleted any medications: No Patient Arrived: Ambulatory Any new allergies or adverse reactions: No Arrival Time: 16:11 Had a fall or experienced change in No Accompanied By: s elf activities of daily living that may affect Transfer Assistance: None risk of falls: Patient Identification Verified: Yes Signs or symptoms of abuse/neglect since last visito No Secondary Verification Process Completed: Yes Hospitalized since last visit: No Patient Requires Transmission-Based No Implantable device outside of the clinic excluding No Precautions: cellular tissue based products placed in the center Patient Has Alerts: Yes since last visit: Patient Alerts: Patient on Blood Has Dressing in Place as Prescribed: Yes Thinner Pain Present Now: No aspirin 81 Electronic Signature(s) Signed: 04/25/2020 4:16:21 PM By: Sandre Kitty Entered By: Sandre Kitty on 04/25/2020 16:11:25 Whetzel, [name] ([id number]) -------------------------------------------------------------------------------- Compression Therapy Details Patient Name: [name], [name]. Date of Service: 04/25/2020 4:15 PM Medical Record Number: [id number] Patient Account Number: [id number] Date of Birth/Sex: [birth year] (74 y.o. F) Treating RN: Montey Hora Primary Care Rameses Ou: [name] Other Clinician: Referring [name] Cardiff: [name] Treating Alaira Level/Extender: STONE III, HOYT Weeks in Treatment: 71 Compression Therapy  Performed for Wound Assessment: Wound #5 Left,Medial Lower Leg Performed By: Clinician Montey Hora, RN Compression Type: Four Layer Pre Treatment ABI: 1 Electronic Signature(s) Signed: 04/25/2020 4:31:58 PM By: Montey Hora Entered By: Montey Hora on 04/25/2020 16:31:57 Friedhoff, [name] ([id number]) -------------------------------------------------------------------------------- Compression Therapy Details Patient Name: [name], [name]. Date of Service: 04/25/2020 4:15 PM Medical Record Number: [id number] Patient Account Number: [id number] Date of Birth/Sex: February 25, 1946 (74 y.o. F) Treating RN: Montey Hora Primary Care Arrianna Catala: [name] Other Clinician: Referring Tullio Chausse: [name] Treating Daylen Hack/Extender: STONE III, HOYT Weeks in Treatment: 71 Compression Therapy Performed for Wound Assessment: Wound #6 Left,Lateral Lower Leg Performed By: Clinician Montey Hora, RN Compression Type: Four Layer Pre Treatment ABI: 1 Electronic Signature(s) Signed: 04/25/2020 4:31:58 PM By: Montey Hora Entered By: Montey Hora on 04/25/2020 16:31:58 Uffelman, [name] ([id number]) -------------------------------------------------------------------------------- Encounter Discharge Information Details Patient Name: [name], [name]. Date of Service: 04/25/2020 4:15 PM Medical Record Number: [id number] Patient Account Number: [id number] Date of Birth/Sex: Feb 07, 1946 (74 y.o. F) Treating RN: Montey Hora Primary Care Dailyn Reith: [name] Other Clinician: Referring Cortney Mckinney: [name] Treating Vicki Pasqual/Extender: Melburn Hake, HOYT Weeks in Treatment: 34 Encounter Discharge Information Items Discharge Condition: Stable Ambulatory Status: Ambulatory Discharge Destination: Home Transportation: Private Auto Accompanied By: self Schedule Follow-up Appointment: Yes Clinical Summary of Care: Electronic Signature(s) Signed: 04/25/2020 4:35:32 PM By: Montey Hora Entered By: Montey Hora on 04/25/2020 16:35:32 Dubiel, [name] ([id number]) -------------------------------------------------------------------------------- Wound Assessment Details Patient Name: [name], [name]. Date of Service: 04/25/2020 4:15 PM Medical Record Number: [id number] Patient Account Number: [id number] Date of Birth/Sex: [birth year] (74 y.o. F) Treating RN: Cornell Barman Primary Care Hal [name]: [name] Other Clinician: Referring Tericka Devincenzi: [name] Treating Franca Stakes/Extender: STONE III, HOYT Weeks in Treatment: 71 Wound Status Wound Number: 5 Primary Lymphedema Etiology: Wound Location: Left, Medial Lower Leg Wound Open Wounding Event: Gradually Appeared Status: Date Acquired: 11/19/2018 Comorbid Cataracts, Asthma, Sleep Apnea, Deep Vein Thrombosis, Weeks Of Treatment: 71 History: Hypertension, Peripheral Venous Disease, Osteoarthritis, Clustered  Wound: No Received Chemotherapy, Received Radiation Photos Wound Measurements Length: (cm) 1.5 Width: (cm) 1.1 Depth: (cm) 0.1 Area: (cm) 1.296 Volume: (cm) 0.13 % Reduction in Area: 84.8% % Reduction in Volume: 84.8% Epithelialization: None Wound Description Classification: Full Thickness Without Exposed Support Structu Wound Margin: Flat and Intact Exudate Amount: Medium Exudate Type: Serous Exudate Color: amber res Foul Odor After Cleansing: No Slough/Fibrino Yes Wound Bed Granulation Amount: Medium (34-66%) Exposed Structure Granulation Quality: Pink Fascia Exposed: No Necrotic Amount: Medium (34-66%) Fat Layer (Subcutaneous Tissue) Exposed: Yes Necrotic Quality: Adherent Slough Tendon Exposed: No Muscle Exposed: No Joint Exposed: No Bone Exposed: No Treatment Notes Wound #5 (Left, Medial Lower Leg) Notes alginate, ABD, TCA, 4 layer Electronic Signature(s) [name], [name] ([id number]) Signed: 04/25/2020 4:16:21 PM By: Sandre Kitty Signed: 04/25/2020 4:54:26 PM By: [name],  [name], RN, CWS, [name], [name] Entered By: Sandre Kitty on 04/25/2020 16:13:50 [name], [name] ([id number]) -------------------------------------------------------------------------------- Wound Assessment Details Patient Name: [name], [name]. Date of Service: 04/25/2020 4:15 PM Medical Record Number: [id number] Patient Account Number: [id number] Date of Birth/Sex: [birth year] (74 y.o. F) Treating RN: Cornell Barman Primary Care Sander Speckman: [name] Other Clinician: Referring Iyana Topor: [name] Treating Elyn Krogh/Extender: Melburn Hake, HOYT Weeks in Treatment: 71 Wound Status Wound Number: 6 Primary Venous Leg Ulcer Etiology: Wound Location: Left, Lateral Lower Leg Wound Open Wounding Event: Gradually Appeared Status: Date Acquired: 01/19/2019 Comorbid Cataracts, Asthma, Sleep Apnea, Deep Vein Thrombosis, Weeks Of Treatment: 66 History: Hypertension, Peripheral Venous Disease, Osteoarthritis, Clustered Wound: No Received Chemotherapy, Received Radiation Photos Wound Measurements Length: (cm) 1.5 Width: (cm) 0.3 Depth: (cm) 0.1 Area: (cm) 0.353 Volume: (cm) 0.035 % Reduction in Area: -60.5% % Reduction in Volume: -59.1% Epithelialization: None Wound Description Classification: Full Thickness Without Exposed Support Structu Wound Margin: Flat and Intact Exudate Amount: Medium Exudate Type: Serous Exudate Color: amber res Foul Odor After Cleansing: No Slough/Fibrino Yes Wound Bed Granulation Amount: Medium (34-66%) Exposed Structure Granulation Quality: Pink Fascia Exposed: No Necrotic Amount: Medium (34-66%) Fat Layer (Subcutaneous Tissue) Exposed: Yes Necrotic Quality: Adherent Slough Tendon Exposed: No Muscle Exposed: No Joint Exposed: No Bone Exposed: No Treatment Notes Wound #6 (Left, Lateral Lower Leg) Notes alginate, ABD, TCA, 4 layer Electronic Signature(s) [name], [name] ([id number]) Signed: 04/25/2020 4:16:21 PM By: Sandre Kitty Signed:  04/25/2020 4:54:26 PM By: [name], [name], RN, CWS, [name], [name] Entered By: Sandre Kitty on 04/25/2020 16:14:17

## 2020-04-27 ENCOUNTER — Other Ambulatory Visit: Payer: Self-pay

## 2020-04-27 ENCOUNTER — Encounter: Payer: Medicare Other | Admitting: Internal Medicine

## 2020-04-27 DIAGNOSIS — I890 Lymphedema, not elsewhere classified: Secondary | ICD-10-CM | POA: Diagnosis not present

## 2020-04-27 DIAGNOSIS — I87332 Chronic venous hypertension (idiopathic) with ulcer and inflammation of left lower extremity: Secondary | ICD-10-CM | POA: Diagnosis not present

## 2020-04-27 DIAGNOSIS — L97221 Non-pressure chronic ulcer of left calf limited to breakdown of skin: Secondary | ICD-10-CM | POA: Diagnosis not present

## 2020-04-27 DIAGNOSIS — I83009 Varicose veins of unspecified lower extremity with ulcer of unspecified site: Secondary | ICD-10-CM | POA: Diagnosis not present

## 2020-04-27 NOTE — Progress Notes (Addendum)
Amber, [name] ([id number]) Visit Report for 04/27/2020 Arrival Information Details Patient Name: Amber, [name]. Date of Service: 04/27/2020 12:30 PM Medical Record Number: [id number] Patient Account Number: [id number] Date of Birth/Sex: 1946-02-27 (74 y.o. F) Treating RN: Cornell Barman Primary Care Laurren Lepkowski: [name] Other Clinician: Referring Emmamae Mcnamara: [name] Treating Avleen Bordwell/Extender: Tito Dine in Treatment: 45 Visit Information History Since Last Visit Added or deleted any medications: No Patient Arrived: Ambulatory Any new allergies or adverse reactions: No Arrival Time: 12:34 Had a fall or experienced change in No Accompanied By: self activities of daily living that may affect Transfer Assistance: None risk of falls: Patient Identification Verified: Yes Signs or symptoms of abuse/neglect since last visito No Secondary Verification Process Completed: Yes Hospitalized since last visit: No Patient Requires Transmission-Based No Implantable device outside of the clinic excluding No Precautions: cellular tissue based products placed in the center Patient Has Alerts: Yes since last visit: Patient Alerts: Patient on Blood Has Dressing in Place as Prescribed: Yes Thinner Has Compression in Place as Prescribed: Yes aspirin 81 Pain Present Now: No Electronic Signature(s) Signed: 04/27/2020 3:55:21 PM By: [name], [name], RN, CWS, [name], [name] Entered By: [name], [name], RN, CWS, Kim on 04/27/2020 13:01:12 [name] ([id number]) -------------------------------------------------------------------------------- Compression Therapy Details Patient Name: Amber, [name]. Date of Service: 04/27/2020 12:30 PM Medical Record Number: [id number] Patient Account Number: [id number] Date of Birth/Sex: February 28, 1946 (74 y.o. F) Treating RN: Cornell Barman Primary Care Dewaun Kinzler: [name] Other Clinician: Referring Letzy Gullickson: [name] Treating  Kerria Sapien/Extender: Tito Dine in Treatment: 72 Compression Therapy Performed for Wound Assessment: Wound #5 Left,Medial Lower Leg Performed By: Clinician Cornell Barman, RN Compression Type: Four Layer Pre Treatment ABI: 1 Post Procedure Diagnosis Same as Pre-procedure Electronic Signature(s) Signed: 05/04/2020 2:28:37 PM By: [name], [name], RN, CWS, [name], [name] Previous Signature: 04/28/2020 10:33:51 AM Version By: [name], [name], RN, CWS, [name], [name] Previous Signature: 04/27/2020 3:55:21 PM Version By: [name], [name], RN, CWS, [name], [name] Entered By: [name], [name], RN, CWS, Kim on 05/04/2020 14:28:37 [name] ([id number]) -------------------------------------------------------------------------------- Encounter Discharge Information Details Patient Name: Amber, [name]. Date of Service: 04/27/2020 12:30 PM Medical Record Number: [id number] Patient Account Number: [id number] Date of Birth/Sex: 02/13/1946 (74 y.o. F) Treating RN: Cornell Barman Primary Care Damareon Lanni: [name] Other Clinician: Referring Theopolis Sloop: [name] Treating Lakoda Mcanany/Extender: Tito Dine in Treatment: 39 Encounter Discharge Information Items Discharge Condition: Stable Ambulatory Status: Ambulatory Discharge Destination: Home Transportation: Private Auto Accompanied By: self Schedule Follow-up Appointment: Yes Clinical Summary of Care: Electronic Signature(s) Signed: 04/27/2020 3:55:21 PM By: [name], [name], RN, CWS, [name], [name] Entered By: [name], [name], RN, CWS, Kim on 04/27/2020 13:04:28 [name] ([id number]) -------------------------------------------------------------------------------- Lower Extremity Assessment Details Patient Name: Amber, [name]. Date of Service: 04/27/2020 12:30 PM Medical Record Number: [id number] Patient Account Number: [id number] Date of Birth/Sex: [birth year] (74 y.o. F) Treating RN: Cornell Barman Primary Care Jemina Scahill: [name] Other  Clinician: Referring Valree Feild: [name] Treating Charlii Yost/Extender: Tito Dine in Treatment: 72 Edema Assessment Assessed: [Left: No] [Right: No] [Left: Edema] [Right: :] Calf Left: Right: Point of Measurement: 33 cm From Medial Instep 41.3 cm cm Ankle Left: Right: Point of Measurement: 10 cm From Medial Instep 22.5 cm cm Vascular Assessment Pulses: Dorsalis Pedis Palpable: [Left:Yes] Electronic Signature(s) Signed: 04/27/2020 3:55:21 PM By: [name], [name], RN, CWS, [name], [name] Entered By: [name], [name], RN, CWS, Kim on 04/27/2020 13:03:11 [name] ([id number]) -------------------------------------------------------------------------------- Multi Wound  Chart Details Patient Name: Amber, [name]. Date of Service: 04/27/2020 12:30 PM Medical Record Number: [id number] Patient Account Number: [id number] Date of Birth/Sex: [birth year] (74 y.o. F) Treating RN: Cornell Barman Primary Care Arantxa Piercey: [name] Other Clinician: Referring Sequoyah Ramone: [name] Treating Ronalee Scheunemann/Extender: Tito Dine in Treatment: 72 Vital Signs Height(in): 63 Pulse(bpm): 49 Weight(lbs): 224.7 Blood Pressure(mmHg): 141/69 Body Mass Index(BMI): 40 Temperature(F): 98.2 Respiratory Rate(breaths/min): 18 Photos: [5:No Photos] [6:No Photos] [N/A:N/A] Wound Location: [5:Left, Medial Lower Leg] [6:Left, Lateral Lower Leg] [N/A:N/A] Wounding Event: [5:Gradually Appeared] [6:Gradually Appeared] [N/A:N/A] Primary Etiology: [5:Lymphedema] [6:Venous Leg Ulcer] [N/A:N/A] Date Acquired: [5:11/19/2018] [6:01/19/2019] [N/A:N/A] Weeks of Treatment: [5:72] [6:66] [N/A:N/A] Wound Status: [5:Open] [6:Open] [N/A:N/A] Measurements L x W x D (cm) [5:1.4x1x0.1] [6:0.8x1.2x0.1] [N/A:N/A] Area (cm) : [5:1.1] [5:1.025] [N/A:N/A] Volume (cm) : [5:0.11] [6:0.075] [N/A:N/A] % Reduction in Area: [5:87.10%] [6:-242.70%] [N/A:N/A] % Reduction in Volume: [5:87.10%] [6:-240.90%]  [N/A:N/A] Classification: [5:Full Thickness Without Exposed Support Structures Compression Therapy] [6:Full Thickness Without Exposed Support Structures N/A] [N/A:N/A N/A] Treatment Notes Wound #5 (Left, Medial Lower Leg) Notes Hydrofera Blue, ABD, 4 layer Wound #6 (Left, Lateral Lower Leg) Notes Hydrofera Blue, ABD, 4 layer Electronic Signature(s) Signed: 04/27/2020 3:45:53 PM By: Linton Ham MD Entered By: Linton Ham on 04/27/2020 13:15:29 Peavler, [name] ([id number]) -------------------------------------------------------------------------------- Multi-Disciplinary Care Plan Details Patient Name: Amber, [name]. Date of Service: 04/27/2020 12:30 PM Medical Record Number: [id number] Patient Account Number: [id number] Date of Birth/Sex: 12/07/1945 (74 y.o. F) Treating RN: Cornell Barman Primary Care Reather Steller: [name] Other Clinician: Referring [name] Sanfilippo: [name] Treating Wilma Wuthrich/Extender: Tito Dine in Treatment: 72 Active Inactive Medication Nursing Diagnoses: Knowledge deficit related to medication safety: actual or potential Goals: Patient/caregiver will demonstrate understanding of new oral/IV medications prescribed at the [facility] (topical prescriptions are covered under the skin breakdown problem) Date Initiated: 12/16/2019 Target Resolution Date: 01/13/2020 Goal Status: Active Interventions: Assess for medication contraindications each visit where new medications are prescribed Treatment Activities: New medication prescribed at [facility] : 12/16/2019 Notes: Soft Tissue Infection Nursing Diagnoses: Impaired tissue integrity Goals: Patient's soft tissue infection will resolve Date Initiated: 12/10/2018 Target Resolution Date: 01/09/2019 Goal Status: Active Interventions: Assess signs and symptoms of infection every visit Notes: Venous Leg Ulcer Nursing Diagnoses: Actual venous Insuffiency (use after diagnosis is  confirmed) Goals: Patient will maintain optimal edema control Date Initiated: 12/10/2018 Target Resolution Date: 01/09/2019 Goal Status: Active Interventions: Assess peripheral edema status every visit. Treatment Activities: Therapeutic compression applied : 12/10/2018 Notes: Wound/Skin Impairment Nursing Diagnoses: DEBBIE, YEARICK ([id number]) Impaired tissue integrity Goals: Patient/caregiver will verbalize understanding of skin care regimen Date Initiated: 12/10/2018 Target Resolution Date: 01/09/2019 Goal Status: Active Interventions: Assess ulceration(s) every visit Treatment Activities: Topical wound management initiated : 12/10/2018 Notes: Electronic Signature(s) Signed: 04/27/2020 3:55:21 PM By: [name], [name], RN, CWS, [name], [name] Entered By: [name], [name], RN, CWS, Kim on 04/27/2020 13:04:52 Deren, [name] ([id number]) -------------------------------------------------------------------------------- Pain Assessment Details Patient Name: Amber, [name]. Date of Service: 04/27/2020 12:30 PM Medical Record Number: [id number] Patient Account Number: [id number] Date of Birth/Sex: 19-Nov-1945 (74 y.o. F) Treating RN: Cornell Barman Primary Care Ommie Degeorge: [name] Other Clinician: Referring Schuyler Behan: [name] Treating Katyra Tomassetti/Extender: Tito Dine in Treatment: 72 Active Problems Location of Pain Severity and Description of Pain Patient Has Paino No Site Locations Pain Management and Medication Current Pain Management: Electronic Signature(s) Signed: 04/27/2020 3:55:21 PM By: [name], [name], RN, CWS, [name], [name] Entered By: [name], [name], RN, CWS, Kim on 04/27/2020 13:01:49 Eastman,  [name] ([id number]) -------------------------------------------------------------------------------- Patient/Caregiver Education Details Patient Name: Amber, [name]. Date of Service: 04/27/2020 12:30 PM Medical Record Number: [id number] Patient Account Number: [id number] Date of  Birth/Gender: 1946/09/02 (74 y.o. F) Treating RN: Cornell Barman Primary Care Physician: [name] Other Clinician: Referring Physician: [name] Treating Physician/Extender: Tito Dine in Treatment: 29 Education Assessment Education Provided To: Patient Education Topics Provided Venous: Handouts: Controlling Swelling with Compression Stockings , Controlling Swelling with Multilayered Compression Wraps Methods: Demonstration, Explain/Verbal Responses: State content correctly Wound/Skin Impairment: Handouts: Caring for Your Ulcer Methods: Demonstration, Explain/Verbal Responses: State content correctly Electronic Signature(s) Signed: 04/27/2020 3:55:21 PM By: [name], [name], RN, CWS, [name], [name] Entered By: [name], [name], RN, CWS, Kim on 04/27/2020 13:07:21 KAIRA, STRINGFIELD ([id number]) -------------------------------------------------------------------------------- Wound Assessment Details Patient Name: Amber, [name]. Date of Service: 04/27/2020 12:30 PM Medical Record Number: [id number] Patient Account Number: [id number] Date of Birth/Sex: 08/06/1946 (74 y.o. F) Treating RN: Cornell Barman Primary Care Dameisha Tschida: [name] Other Clinician: Referring Heidie Krall: [name] Treating Darolyn Double/Extender: Tito Dine in Treatment: 72 Wound Status Wound Number: 5 Primary Etiology: Lymphedema Wound Location: Left, Medial Lower Leg Wound Status: Open Wounding Event: Gradually Appeared Date Acquired: 11/19/2018 Weeks Of Treatment: 72 Clustered Wound: No Photos Photo Uploaded By: Montey Hora on 04/27/2020 13:25:53 Wound Measurements Length: (cm) 1.4 Width: (cm) 1 Depth: (cm) 0.1 Area: (cm) 1.1 Volume: (cm) 0.11 % Reduction in Area: 87.1% % Reduction in Volume: 87.1% Wound Description Classification: Full Thickness Without Exposed Support Structu res Electronic Signature(s) Signed: 04/27/2020 3:55:21 PM By: [name], [name], RN, CWS, [name],  [name] Entered By: [name], [name], RN, CWS, Kim on 04/27/2020 13:02:42 RAINEY, RODGER ([id number]) -------------------------------------------------------------------------------- Wound Assessment Details Patient Name: Amber, [name]. Date of Service: 04/27/2020 12:30 PM Medical Record Number: [id number] Patient Account Number: [id number] Date of Birth/Sex: 1946-04-17 (74 y.o. F) Treating RN: Cornell Barman Primary Care Alif Petrak: [name] Other Clinician: Referring Kingstyn Deruiter: [name] Treating Jacquese Hackman/Extender: Tito Dine in Treatment: 72 Wound Status Wound Number: 6 Primary Etiology: Venous Leg Ulcer Wound Location: Left, Lateral Lower Leg Wound Status: Open Wounding Event: Gradually Appeared Date Acquired: 01/19/2019 Weeks Of Treatment: 66 Clustered Wound: No Photos Photo Uploaded By: Montey Hora on 04/27/2020 13:25:53 Wound Measurements Length: (cm) 0.8 Width: (cm) 1.2 Depth: (cm) 0.1 Area: (cm) 0.754 Volume: (cm) 0.075 % Reduction in Area: -242.7% % Reduction in Volume: -240.9% Wound Description Classification: Full Thickness Without Exposed Support Structu res Electronic Signature(s) Signed: 04/27/2020 3:55:21 PM By: [name], [name], RN, CWS, [name], [name] Entered By: [name], [name], RN, CWS, Kim on 04/27/2020 13:02:43 Nier, [name] ([id number]) -------------------------------------------------------------------------------- Vitals Details Patient Name: Amber, [name]. Date of Service: 04/27/2020 12:30 PM Medical Record Number: [id number] Patient Account Number: [id number] Date of Birth/Sex: 19-Jul-1946 (74 y.o. F) Treating RN: Cornell Barman Primary Care Steffie Waggoner: [name] Other Clinician: Referring Blanchard Willhite: [name] Treating Agapita Savarino/Extender: Tito Dine in Treatment: 72 Vital Signs Time Taken: 12:32 Temperature (F): 98.2 Height (in): 63 Pulse (bpm): 84 Weight (lbs): 224.7 Respiratory Rate (breaths/min): 18 Body  Mass Index (BMI): 39.8 Blood Pressure (mmHg): 141/69 Reference Range: 80 - 120 mg / dl Electronic Signature(s) Signed: 04/27/2020 3:55:21 PM By: [name], [name], RN, CWS, [name], [name] Entered By: [name], [name], RN, CWS, Kim on 04/27/2020 13:01:39

## 2020-04-27 NOTE — Progress Notes (Addendum)
HECTOR, VENNE ([id number]) Visit Report for 04/27/2020 HPI Details Patient Name: [name], [name]. Date of Service: 04/27/2020 12:30 PM Medical Record Number: [id number] Patient Account Number: [id number] Date of Birth/Sex: [birth year] (74 y.o. F) Treating RN: Cornell Barman Primary Care Provider: [name] Other Clinician: Referring Provider: [name] Treating Provider/Extender: Tito Dine in Treatment: 43 History of Present Illness HPI Description: Pleasant 74 year old with history of chronic venous insufficiency. No diabetes or peripheral vascular disease. Left ABI 1.29. Questionable history of left lower extremity DVT. She developed a recurrent ulceration on her left lateral calf in December 2015, which she attributes to poor diet and subsequent lower extremity edema. She underwent endovenous laser ablation of her left greater saphenous vein in 2010. She underwent laser ablation of accessory branch of left GSV in April 2016 by Dr. Kellie Simmering at [facility]. She was previously wearing Unna boots, which she tolerated well. Tolerating 2 layer compression and cadexomer iodine. She returns to clinic for follow-up and is without new complaints. She denies any significant pain at this time. She reports persistent pain with pressure. No claudication or ischemic rest pain. No fever or chills. No drainage. READMISSION 11/13/16; this is a 74 year old woman who is not a diabetic. She is here for a review of a painful area on her left medial lower extremity. I note that she was seen here previously last year for wound I believe to be in the same area. At that time she had undergone previously a left greater saphenous vein ablation by Dr. Kellie Simmering and she had a ablation of the anterior accessory branch of the left greater saphenous vein in March 2016. Seeing that the wound actually closed over. In reviewing the history with her today the ulcer in this area has been recurrent. She describes  a biopsy of this area in 2009 that only showed stasis physiology. 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 Do not see that she has ever had arterial studies in the left leg. She tells me she has a follow-up with Dr. Kellie Simmering in roughly 10 days In any case she developed the reopening of this area roughly a month ago. On the background of this she describes rapidly increasing edema which has responded to Lasix 40 mg and metolazone 2.5 mg as well as the patient's lymph massage. She has been told she has both venous insufficiency and lymphedema but she cannot tolerate compression stockings 11/28/16; the patient saw Dr. Kellie Simmering recently. Per the patient he did arterial Dopplers in the office that did not show evidence of arterial insufficiency, per the patient he stated "treat this like an ordinary venous ulcer". She also saw her dermatologist Dr. Ronnald Ramp who felt that this was more of a vascular ulcer. In general things are improving although she arrives today with increasing bilateral lower extremity edema with weeping a deeper fluid through the wound on the left medial leg compatible with some degree of lymphedema 12/04/16; the patient's wound is fully epithelialized but I don't think fully healed. We will do another week of depression with Promogran and TCA however I suspect we'll be able to discharge her next week. This is a very unusual-looking wound which was initially a figure-of-eight type wound lying on its side surrounded by petechial like hemorrhage. She  has had venous ablation on this side. She apparently does not have an arterial issue per Dr. Kellie Simmering. She saw her dermatologist thought it was "vascular". Patient is definitely going to need ongoing compression and I talked about  this with her today she will go to elastic therapy after she leaves here next week 12/11/16; the patient's wound is not completely closed today. She has surrounding scar tissue and in further discussion with the patient it would appear that she had ulcers in this area in 2009 for a prolonged period of time ultimately requiring a punch biopsy of this area that only showed venous insufficiency. I did not previously pickup on this part of the history from the patient. 12/18/16; the patient's wound is completely epithelialized. There is no open area here. She has significant bilateral venous insufficiency with secondary lymphedema to a mild-to-moderate degree she does not have compression stockings.. She did not say anything to me when I was in the room, she told our intake nurse that she was still having pain in this area. This isn't unusual recurrent small open area. She is going to go to elastic therapy to obtain compression stockings. 12/25/16; the patient's wound is fully epithelialized. There is no open area here. The patient describes some continued episodic discomfort in this area medial left calf. However everything looks fine and healed here. She is been to elastic therapy and caught herself 15-20 mmHg stockings, they apparently were having trouble getting 20-30 mm stockings in her size 01/22/17; this is a patient we discharged from the clinic a month ago. She has a recurrent open wound on her medial left calf. She had 15 mm support stockings. I told her I thought she needed 20-30 mm compression stockings. She tells me that she has been ill with hospitalization secondary to asthma and is been found to have severe hypokalemia likely secondary to a combination of Lasix and metolazone. This morning she noted blistering and leaking fluid on the posterior part of her left leg. She called our intake nurse urgently and we was saw her this afternoon. She has not had any real discomfort here. I don't know  that she's been wearing any stockings on this leg for at least 2-3 days. ABIs in this clinic were 1.21 on the right and 1.3 on the left. She is previously seen vascular surgery who does not think that there is a peripheral arterial issue. 01/30/17; Patient arrives with no open wound on the left leg. She has been to elastic therapy and obtained 20-14mmhg below knee stockings and she has one on the right leg today. READMISSION 02/19/18; this Cirrincione is a now 74 year old patient we've had in this clinic perhaps 3 times before. I had last looked at her from January 07 December 2016 with an area on the medial left leg. We discharged her on 12/25/16 however she had to be readmitted on 01/22/17 with a recurrence. I have in my notes that we discharged her on 20-30 mm stockings although she tells me she was only wearing support hose because she cannot get stockings on predominantly related to her cervical spine surgery/issues. She has had previous ablations done by vein and vascular in [place] including a great saphenous vein ablation on the left with an anterior accessory branch ablation I think both of these were in 2016. On one of the previous visit she had a biopsy noted 2009 that was negative. She is not felt to have an arterial issue. She is not a diabetic. She does  have a history of obstructive sleep apnea hypertension asthma as well as chronic venous insufficiency and lymphedema. On this occasion she noted 2 dry scaly patch on her left leg. She tried to put lotion on this it didn't really help. There were 2 open areas.the KAMORAH, NEVILS ([id number]) patient has been seeing her primary physician from 02/05/18 through 02/14/18. She had Unna boots applied. The superior wound now on the lateral left leg has closed but she's had one wound that remains open on the lateral left leg. This is not the same spot as we dealt with in 2018. ABIs in this clinic were 1.3 bilaterally 02/26/18; patient has a small wound on  the left lateral calf. Dimensions are down. She has chronic venous insufficiency and lymphedema. 03/05/18; small open area on the left lateral calf. Dimensions are down. Tightly adherent necrotic debris over the surface of the wound which was difficult to remove. Also the dressing [over collagen] stuck to the wound surface. This was removed with some difficulty as well. Change the primary dressing to Hydrofera Blue ready 03/12/18; small open area on the left lateral calf. Comes in with tightly adherent surface eschar as well as some adherent Hydrofera Blue. 03/19/18; open area on the left lateral calf. Again adherent surface eschar as well as some adherent Hydrofera Blue nonviable subcutaneous tissue. She complained of pain all week even with the reduction from 4-3 layer compression I put on last week. Also she had an increase in her ankle and calf measurements probably related to the same thing. 03/26/18; open area on the left lateral calf. A very small open area remains here. We used silver alginate starting last week as the Hydrofera Blue seem to stick to the wound bed. In using 4-layer compression 04/02/18; the open area in the left lateral calf at some adherent slough which I removed there is no open area here. We are able to transition her into her own compression stocking. Truthfully I think this is probably his support hose. However this does not maintain skin integrity will be limited. She cannot put over the toe compression stockings on because of neck problems hand problems etc. She is allergic to the lining layer of juxta lites. We might be forced to use extremitease stocking should this fail READMIT 11/24/2018 Patient is now a 74 year old woman who is not a diabetic. She has been in this clinic on at least 3 previous occasions largely with recurrent wounds on her left leg secondary to chronic venous insufficiency with secondary lymphedema. Her situation is complicated by inability to get  stockings on and an allergy to neoprene which is apparently a component and at least juxta lites and other stockings. As a result she really has not been wearing any stockings on her legs. She tells [place] that roughly 2 or 3 weeks ago she started noticing a stinging sensation just above her ankle on the left medial aspect. She has been diagnosed with pseudogout and she wondered whether this was what she was experiencing. She tried to dress this with something she bought at the store however subsequently it pulled skin off and now she has an open wound that is not improving. She has been using Vaseline gauze with a cover bandage. She saw her primary doctor last week who put an Haematologist on her. ABIs in this clinic was 1.03 on the left 2/12; the area is on the left medial ankle. Odd-looking wound with what looks to be surface epithelialization but a multitude of  small petechial openings. This clearly not closed yet. We have been using silver alginate under 3 layer compression with TCA 2/19; the wound area did not look quite as good this week. Necrotic debris over the majority of the wound surface which required debridement. She continues to have a multitude of what looked to be small petechial openings. She reminds [place] that she had a biopsy on this initially during her first outbreak in 2015 in [place] [facility]. She expresses concern about this being a possible melanoma. 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 I assured her that this does not look anything like melanoma. Except for the petechial reaction it does look like a venous insufficiency area and she certainly has evidence of this on both sides 2/26; a difficult area on the left medial ankle. The patient clearly has chronic venous hypertension with some degree of lymphedema. The odd thing about the area is the small petechial hemorrhages. I am not  really sure how to explain this. This was present last time and this is not a compression injury. We have been using Hydrofera Blue which I changed to last week 3/4; still using Hydrofera Blue. Aggressive debridement today. She does not have known arterial issues. She has seen Dr. Kellie Simmering at [facility] vein and vascular and and has an ablation on the left. [Anterior accessory branch of the greater saphenous]. From what I remember they did not feel she had an arterial issue. The patient has had this area biopsied in 2009 at [facility] dermatology and by her recollection they said this was "stasis". She is also follow-up with dermatology locally who thought that this was more of a vascular issue 3/11; using Hydrofera Blue. Aggressive debridement today. She does not have an arterial issue. We are using 3 layer compression although we may need to go to 4. The patient has been in for multiple changes to her wrap since I last saw her a week ago. She says that the area was leaking. I do not have too much more information on what was found 01/19/19 on evaluation today patient was actually being seen for a nurse visit when unfortunately she had the area on her left lateral lower extremity as well as weeping from the right lower extremity that became apparent. Therefore we did end up actually seeing her for a full visit with myself. She is having some pain at this site as well but fortunately nothing too significant at this point. No fevers, chills, nausea, or vomiting noted at this time. 3/18-Patient is back to the clinic with the left leg venous leg ulcer, the ulcer is larger in size, has a surface that is densely adherent with fibrinous tissue, the Hydrofera Blue was used but is densely adherent and there was difficulty in removing it. The right lower extremity was also wrapped for weeping edema. Patient has a new area over the left lateral foot above the malleolus that is small and appears to have no debris  with intact surrounding skin. Patient is on increased dose of Lasix also as a means to edema management 3/25; the patient has a nonhealing venous ulcer on the medial left leg and last week developed a smaller area on the lateral left calf. We have been using Hydrofera Blue with a contact layer. 4/1; no major change in these wounds areas. Left medial and more recently left lateral calf. I tried Iodoflex last week to aid in debridement  she did not tolerate this. She stated her pain was terrible all week. She took the top layer of the 4 layer compression off. 4/8; the patient actually looks somewhat better in terms of her more prominent left lateral calf wound. There is some healthy looking tissue here. She is still complaining of a lot of discomfort. 4/15; patient in a lot of pain secondary to sciatica. She is on a prednisone taper prescribed by her primary physician. She has the 2 areas one on the left medial and more recently a smaller area on the left lateral calf. Both of these just above the malleoli 4/22; her back pain is better but she still states she is very uncomfortable and now feels she is intolerant to the [employer]. No real change in the wounds we have been using Sorbact. She has been previously intolerant to Iodoflex. There is not a lot of option about what we can use to debride this wound under compression that she no doubt needs. sHe states Ultram no longer works for her pain 4/29; no major change in the wounds slightly increased depth. Surface on the original medial wound perhaps somewhat improved however the more recent area on the lateral left ankle is 100% covered in very adherent debris we have been using Sorbact. She tolerates 4 layer compression well and her edema control is a lot better. She has not had to come in for a nurse check 5/6; no major change in the condition of the wounds. She did consent to debridement today which was done with some difficulty. Continuing Sorbact.  She did not tolerate Iodoflex. She was in for a check of her compression the day after we wrapped her last week this was adjusted but nothing much was found 5/13; no major change in the condition or area of the wounds. I was able to get a fairly aggressive debridement done on the lateral left leg wound. Even using Sorbact under compression. She came back in on Friday to have the wrap changed. She says she felt uncomfortable on the WAYLYNN, BENEFIEL. ([id number]) lateral aspect of her ankle. She has a long history of chronic venous insufficiency including previous ablation surgery on this side. 5/20-Patient returns for wounds on left leg with both wounds covered in slough, with the lateral leg wound larger in size, she has been in 3 layer compression and felt more comfortable, she describes pain in ankle, in leg and pins and needles in foot, and is about to try Pamelor for this 6/3; wounds on the left lateral and left medial leg. The area medially which is the most recent of the 2 seems to have had the largest increase in dimensions. We have been using Sorbac to try and debride the surface. She has been to see orthopedics they apparently did a plain x-ray that was indeterminant. Diagnosed her with neuropathy and they have ordered an MRI to determine if there is underlying osteomyelitis. This was not high on my thought list but I suppose it is prudent. We have advised her to make an appointment with vein and vascular in [place]. She has a history of a left greater saphenous and accessory vein ablations I wonder if there is anything else that can be done from a surgical point of view to help in these difficult refractory wounds. We have previously healed this wound on one occasion but it keeps on reopening [medial side] 6/10; deep tissue culture I did last week I think on the left medial wound showed  both moderate E. coli and moderate staph aureus [MSSA]. She is going to require antibiotics and I have  chosen Augmentin. We have been using Sorbact and we have made better looking wound surface on both sides but certainly no improvement in wound area. She was back in last Friday apparently for a dressing changes the wrap was hurting her outer left ankle. She has not managed to get a hold of vein and vascular in [place]. We are going to have to make her that appointment 6/17; patient is tolerating the Augmentin. She had an MRI that I think was ordered by orthopedic surgeon this did not show osteomyelitis or an abscess did suggest cellulitis. We have been using Sorbact to the lateral and medial ankles. We have been trying to arrange a follow-up appointment with vein and vascular in [place] or did her original ablations. We apparently an area sent the request to vein and vascular in [facility] 6/24; patient has completed the Augmentin. We do not yet have a vein and vascular appointment in [place]. I am not sure what the issue is here we have asked her to call tomorrow. We are using Sorbact. Making some improvements and especially the medial wound. Both surfaces however look better medial and lateral. 7/1; the patient has been in contact with vein and vascular in [place] but has not yet received an appointment. Using Sorbact we have gradually improve the wound surface with no improvement in surface area. She is approved for Apligraf but the wound surface still is not completely viable. She has not had to come in for a dressing change 7/8; the patient has an appointment with vein and vascular on 7/31 which is a Friday afternoon. She is concerned about getting back here for [place] to dress her wounds. I think it is important to have them goal for her venous reflux/history of ablations etc. to see if anything else can be done. She apparently tested positive for 1 of the blood tests with regards to lupus and saw a rheumatologist. He has raised the issue of vasculitis again. I have had this thought in  the past however the evidence seems overwhelming that this is a venous reflux etiology. If the rheumatologist tells me there is clinical and laboratory investigation is positive for lupus I will rethink this. 7/15; the patient's wound surfaces are quite a bit better. The medial area which was her original wound now has no depth although the lateral wound which was the more recent area actually appears larger. Both with viable surfaces which is indeed better. Using Sorbact. I wanted to use Apligraf on her however there is the issue of the vein and vascular appointment on 7/31 at 2:00 in the afternoon which would not allow her to get back to be rewrapped and they would no doubt remove the graft 7/22; the patient's wound surfaces have moderate amount of debris although generally look better. The lateral one is larger with 2 small satellite areas superiorly. We are waiting for her vein and vascular appointment on 7/31. She has been approved for Apligraf which I would like to use after th 7/29; wound surfaces have improved no debridement is required we have been using Sorbact. She sees vein and vascular on Friday with this so question of whether anything can be done to lessen the likelihood of recurrence and/or speed the healing of these areas. She is already had previous ablations. She no doubt has severe venous hypertension 8/5-Patient returns at 1 week, she was in [employer]  for 3 days by her podiatrist, we have been using so backed to the wound, she has increased pain in both the wounds on the left lower leg especially the more distal one on the lateral aspect 8/12-Patient returns at 1 week and she is agreeable to having debridement in both wounds on her left leg today. We have been using Sorbact, and vascular studies were reviewed at last visit 8/19; the patient arrives with her wounds fairly clean and no debridement is required. We have used Sorbact which is really done a nice job in cleaning up  these very difficult wound surfaces. The patient saw Dr. Donzetta Matters of vascular surgery on 7/31. He did not feel that there was an arterial component. He felt that her treated greater saphenous vein is adequately addressed and that the small saphenous vein did not appear to be involved significantly. She was also noted to have deep venous reflux which is not treatable. Dr. Donzetta Matters mentioned the possibility of a central obstructive component leading to reflux and he offered her central venography. She wanted to discuss this or think about it. I have urged her to go ahead with this. She has had recurrent difficult wounds in these areas which do heal but after months in the clinic. If there is anything that can be done to reduce the likelihood of this I think it is worth it. 9/2 she is still working towards getting follow-up with Dr. Donzetta Matters to schedule her CT. Things are quite a bit worse venography. I put Apligraf on 2 weeks ago on both wounds on the medial and lateral part of her left lower leg. She arrives in clinic today with 3 superficial additional wounds above the area laterally and one below the wound medially. She describes a lot of discomfort. I think these are probably wrapped injuries. Does not look like she has cellulitis. 07/20/2019 on evaluation today patient appears to be doing somewhat poorly in regard to her lower extremity ulcers. She in fact showed signs of erythema in fact we may even be dealing with an infection at this time. Unfortunately I am unsure if this is just infection or if indeed there may be some allergic reaction that occurred as a result of the Apligraf application. With that being said that would be unusual but nonetheless not impossible in this patient is one who is unfortunately allergic to quite a bit. Currently we have been using the Sorbact which seems to do as well as anything for her. I do think we may want to obtain a culture today to see if there is anything showing up  there that may need to be addressed. 9/16; noted that last week the wounds look worse in 1 week follow-up of the Apligraf. Using Sorbact as of 2 days ago. She arrives with copious amounts of drainage and new skin breakdown on the back of the left calf. The wounds arm more substantial bilaterally. There is a fair amount of swelling in the left calf no overt DVT there is edema present I think in the left greater than right thigh. She is supposed to go on 9/28 for CT venography. The wounds on the medial and lateral calf are worse and she has new skin breakdown posteriorly at least new for me. This is almost developing into a circumferential wound area The Apligraf was taken off last week which I agree with things are not going in the right direction a culture was done we do not have that back yet. She is  on Augmentin that she started 2 days ago 9/23; dressing was changed by her nurses on Monday. In general there is no improvement in the wound areas although the area looks less angry than last week. She did get Augmentin for MSSA cultured on the 14th. She still appears to have too much swelling in the left leg even with 3 layer compression 9/30; the patient underwent her procedure on 9/28 by Dr. Donzetta [facility]. She was discovered to have the common iliac vein measuring 12.2 mm but at the level of L4-L5 measured 3 mm. After stenting it measured 10 mm. 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 Rouleaux flow in the common femoral and femoral vein was observed much improved after stenting. We are using silver alginate to the wounds on the medial and lateral ankle on the left. 4 layer compression 10/7; the patient had fluid swelling around her knee and 4 layer compression. At the advice of vein and vascular this was reduced to 3 layer which she is tolerating better. We have been using silver alginate under 3 layer compression since last Friday 10/14; arrives with the  areas on the left ankle looking a lot better. Inflammation in the area also a lot better. She came in for a nurse check on SURA, CANUL ([id number]) 10/9 10/21; continued nice improvement. Slight improvements in surface area of both the medial and lateral wounds on the left. A lot of the satellite lesions in the weeping erythema around these from stasis dermatitis is resolved. We have been using silver alginate 10/28; general improvement in the entire wound areas although not a lot of change in dimensions the wound certainly looks better. There is a lot less in terms of venous inflammation. Continue silver alginate this week however look towards Hydrofera Blue next week 11/4; very adherent debris on the medial wound left wound is not as bad. We have been using silver alginate. Change to [facility] today 11/11; very adherent debris on both wound areas. She went to vein and vascular last week and follow-up they put in [place] boot on this today. He says the [facility] was adherent. Wound is definitely not as good as last week. Especially on the left there the satellite lesions look more prominent 11/18; absolutely no better. erythema on lateral aspect with tenderness. 09/30/2019 on evaluation today patient appears to actually be doing better. Dr. Dellia Nims did put her on doxycycline last week which I do believe has helped her at this point. Fortunately there is no signs of active infection at this time. No fevers, chills, nausea, vomiting, or diarrhea. I do believe he may want extend the doxycycline for 7 additional days just to ensure everything does completely cleared up the patient is in agreement with that plan. Otherwise she is going require some sharp debridement today 12/2; patient is completing a 2-week course of doxycycline. I gave her this empirically for inflammation as well as infection when I last saw her 2 weeks ago. All of this seems to be better. She is using silver alginate she  has the area on the medial aspect of the larger area laterally and the 2 small satellite regions laterally above the major wound. 12/9; the patient's wound on the left medial and left lateral calf look really quite good. We have been using silver alginate. She saw vein and vascular in follow-up on 10/09/2019. She has had a previous left greater saphenous vein ablation by Dr. Oscar La in 2016.  More recently she underwent a left common iliac vein stent by Dr. 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 The swelling is improved and certainly the wounds have improved. The patient shows [place] today area on the right medial calf there is almost no wound but leaking lymphedema. She says she start this started 3 or 4 days ago. She did not traumatize it. It is not painful. She does not wear compression on that side 12/16; the patient continues to do well laterally. Medially still requiring debridement. The area on the right calf did not materialize to anything and is not currently open. We wrapped this last time. She has support stockings for that leg although I am not sure they are going to provide adequate compression 12/23; the lateral wound looks stable. Medially still requiring debridement for tightly adherent fibrinous debris. We've been using silver alginate. Surface area not any different 12/30; neither wound is any better with regards to surface and the area on the left lateral is larger. I been using silver alginate to the left lateral which look quite good last week and Sorbact to the left medial 11/11/2019. Lateral wound area actually looks better and somewhat smaller. Medial still requires a very aggressive debridement today. We have been using Sorbact on both wound areas 1/13; not much better still adherent debris bilaterally. I been using Sorbact. She has severe venous hypertension. Probably some degree of dermal fibrosis distally. I wonder whether tighter compression might help and I am going  to try that today. We also need to work on the bioburden 1/20; using Sorbact. She has severe venous hypertension status post stent placement for pelvic vein compression. We applied gentamicin last time to see if we could reduce bioburden I had some discussion with her today about the use of pentoxifylline. This is occasionally used in this setting for wounds with refractory venous insufficiency. However this interacts with Plavix. She tells me that she was put on this after stent placement for 3 months. She will call Dr. Claretha Cooper office to discuss 1/27; we are using gentamicin under Sorbact. 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 She has a stent. Wound medially is measuring smaller this week. Laterally measuring slightly larger although she has some satellite lesions superiorly 2/3; gentamicin under Sorbact under 4-layer compression. 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 She has a stent on Plavix. Her wounds are measuring smaller this week. More substantially laterally where there is a satellite lesion superiorly. 2/10; gentamicin under Sorbac. 4-layer compression. Patient communicated with Dr. Donzetta Matters at vein and vascular in [place]. He is okay with the patient coming off Plavix I will therefore start her on pentoxifylline for a 1 month trial. In general her wounds look better today. I had some concerns about swelling in the left thigh however she measures 61.5 on the right and 63 on the mid thigh which does not suggest there is any difficulty. The patient is not describing any pain. 2/17; gentamicin under Sorbac 4-layer compression. She has been on pentoxifylline for 1 week and complains of loose stool. No nausea she is eating and drinking well 2/24; the patient apparently came in 2 days ago for a nurse visit when her wrap fell down. Both areas look a little worse this week macerated medially and satellite lesions laterally. Change to  silver alginate today 3/3; wounds are larger today especially medially. She also has more swelling in her foot lower leg and I even noted some swelling in  her posterior thigh which is tender. I wonder about the patency of her stent. Fortuitously she sees Dr. Claretha Cooper group on Friday 3/10; Mrs. Finerty was seen by vein and vascular on 3/5. The patient underwent ultrasound. There was no evidence of thrombosis involving the IVC no evidence of thrombosis involving the right common iliac vein there is no evidence of thrombosis involving the right external iliac vein the left external vein is also patent. The right common iliac vein stent appears patent bilateral common femoral veins are compressible and appear patent. I was concerned about the left common iliac stent however it looks like this is functional. She has some edema in the posterior thigh that was tender she still has that this week. I also note they had trouble finding the pulses in her left foot and booked her for an ABI baseline in 4 weeks. She will follow up in 6 months for repeat IVC duplex. The patient stopped the pentoxifylline because of diarrhea. It does not look like that was being effective in any case. I have advised her to go back on her aspirin 81 mg tablet, vascular it also suggested this 3/17; comes in today with her wound surfaces a lot better. The excoriations from last week considerably better probably secondary to the TCA. We have been using silver alginate 3/24; comes in today with smaller wounds both medially and laterally. Both required debridement. There are 2 small satellite areas superiorly laterally. She also has a very odd bandlike area in the mid calf almost looking like there was a weakness in the wrap in a localized area. I would write this off as being this however anteriorly she has a small raised ballotable area that is very tender almost reminiscent of an abscess but there was no obvious purulent surface to it.  02/04/20 upon evaluation today patient appears to be doing fairly well in regard to her wounds today. Fortunately there is no signs of active TAEKO, SCHAFFER. ([id number]) infection at this time. No fevers, chills, nausea, vomiting, or diarrhea. She has been tolerating the dressing changes without complication. Fortunately I feel like she is showing signs of improvement although has been sometime since have seen her. Nonetheless the area of concern that Dr. Dellia Nims had last week where she had possibly an area of the wrap that was we can allow the leg to bulge appears to be doing significantly better today there is no signs of anything worsening. 4/7; the patient's wounds on her medial and lateral left leg continue to contract. We have been using a regular alginate. Last week she developed an area on the right medial lower leg which is probably a venous ulcer as well. 4/14; the wounds on her left medial and lateral lower leg continue to contract. Surface eschar. We have been using regular alginate. The area on the right medial lower leg is closed. We have been putting both legs under 4-layer contraction. The patient went back to see vein and vascular she had arterial studies done which were apparently "quite good" per the patient although I have not read their notes I have never felt she had an arterial issue. 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 This is been longstanding and refractory to exercise, leg elevation and longstanding use of compression wraps in our clinic as well as compression stockings on the times we have been able to get these to heal 4/21; we thought she actually might be close this week however she arrives in  clinic with a lot of edema in her upper left calf and into her posterior thigh. This is been an intermittent problem here. She says the wrap fell down but it was replaced with a nurse visit on Monday. We are using calcium  alginate to the wounds and the wound sizes there not terribly larger than last week but there is a lot more edema 4/28; again wound edges are smaller on both sides. Her edema is better controlled than last time. She is obtained her compression pumps from [employer] although they have not been to her home to set these up. 5/5; left medial and left lateral both look stable. I am not sure the medial is any smaller. We have been using calcium alginate under 4-layer compression. oShe had an area on the right medial. This was eschared today. We have been wrapping this as well. She does not tolerate external compression stockings due to a history of various contact allergies. She has her compression pumps however the representative from the company is coming on her to show her how to use these tomorrow 5/19; patient with severe chronic venous insufficiency secondary to central venous disease. She had a stent placed in her left common iliac vein. She has done better since but still difficult to control wounds. She comes in today with nothing open on the right leg. Her areas on the left medial and left lateral are just about closed. We are using calcium alginate under 4-layer compression. She is using her external compression pumps at home She only has 15-20 support stockings. States she cannot get anything tighter than that on. 03/30/20-Patient returns at 1 week, the wounds on the left leg are both slightly bigger, the last week she was on 3 layer compression which started to slide down. She is starting to use her lymphedema pumps although she stated on 1 day her right ankle started to swell up and she have to stop that day. Unfortunately the open area seem to oscillate between improving to the point of healing and then flaring up all to do with effectiveness of compression or lack of due to the left leg topography not keeping the compression wraps from rolling down 6/2 patient comes in with a 15/20  mmHg stocking on the right leg. She tells me that she developed a lot of swelling in her ankles she saw orthopedics she was felt to possibly be having a flare of pseudogout versus some other type of arthritis. She was put on steroids for a respiratory issue so that helps with the inflammation. She has not been using the pumps all week. She thinks the left thigh is more swollen than usual and I would agree with that. She has an appointment with Dr. Donzetta Matters 9 days or so from now 6/9; both wounds on the left medial and left lateral are smaller. We have been using calcium alginate under compression. She does not have an open wound on the right leg she is using a stocking and her compression pumps things are going well. She has an appointment with Dr. Donzetta Matters with regards to her stent in the left common iliac vein 6/16; the wounds on the left medial and left lateral ankle continues to contract. The patient saw Dr. Donzetta Matters and I think he seems satisfied. Ordered follow-up venous reflux studies on both sides in September. Cautioned that she may need thigh-high stockings. She has been using calcium alginate under compression on the left and her own stocking on the right leg.  She tells [place] there are no open wounds on the right 6/23; left lateral is just about closed. Medial required debridement today. We have been using calcium alginate. Extensive discussion about the compression pumps she is only using these on 25 mmHg states she could not take 40 or 30 when the wrap came out to her home to demonstrate these. He said they should not feel tight Electronic Signature(s) Signed: 04/27/2020 3:45:53 PM By: Linton Ham MD Entered By: Linton Ham on 04/27/2020 13:18:01 Zehner, [name] ([id number]) -------------------------------------------------------------------------------- Physical Exam Details Patient Name: ANNITTA, FIFIELD. Date of Service: 04/27/2020 12:30 PM Medical Record Number: [id number] Patient Account  Number: [id number] Date of Birth/Sex: [birth year] (74 y.o. F) Treating RN: Cornell Barman Primary Care Provider: [name] Other Clinician: Referring Provider: [name] Treating Provider/Extender: Tito Dine in Treatment: 72 Notes Wound exam; medial side adherent debris again removed with a #3 curette. Hemostasis with direct pressure lateral side is a small longitudinal wound with minimal with looking like it is going towards closure. Her edema control is marginal. Electronic Signature(s) Signed: 04/27/2020 3:45:53 PM By: Linton Ham MD Entered By: Linton Ham on 04/27/2020 13:18:36 Cherry, [name] ([id number]) -------------------------------------------------------------------------------- Physician Orders Details Patient Name: MARIELLEN, BLANEY. Date of Service: 04/27/2020 12:30 PM Medical Record Number: [id number] Patient Account Number: [id number] Date of Birth/Sex: 02/15/1946 (74 y.o. F) Treating RN: Cornell Barman Primary Care Provider: [name] Other Clinician: Referring Provider: [name] Treating Provider/Extender: Tito Dine in Treatment: 40 Verbal / Phone Orders: No Diagnosis Coding Anesthetic (add to Medication List) Wound #5 Left,Medial Lower Leg o Topical Lidocaine 4% cream applied to wound bed prior to debridement (In Clinic Only). Wound #6 Left,Lateral Lower Leg o Topical Lidocaine 4% cream applied to wound bed prior to debridement (In Clinic Only). Primary Wound Dressing Wound #5 Left,Medial Lower Leg o Hydrafera Blue Ready Transfer Wound #6 Left,Lateral Lower Leg o Hydrafera Blue Ready Transfer Secondary Dressing Wound #5 Left,Medial Lower Leg o ABD pad Wound #6 Left,Lateral Lower Leg o ABD pad Dressing Change Frequency Wound #5 Left,Medial Lower Leg o Change dressing every week o Other: - Nurse visit Friday and Monday if needed. Wound #6 Left,Lateral Lower Leg o Change dressing every  week o Other: - Nurse visit Friday and Monday if needed. Follow-up Appointments Wound #5 Left,Medial Lower Leg o Return Appointment in 1 week. o Nurse Visit as needed Wound #6 Left,Lateral Lower Leg o Return Appointment in 1 week. o Nurse Visit as needed Edema Control Wound #5 Left,Medial Lower Leg o 4-Layer Compression System - Left Lower Extremity. o Patient to wear own compression stockings - On Right o Compression Pump: Use compression pump on left lower extremity for 60 minutes, twice daily. o Compression Pump: Use compression pump on right lower extremity for 60 minutes, twice daily. Wound #6 Left,Lateral Lower Leg o 4-Layer Compression System - Left Lower Extremity. o Patient to wear own compression stockings - On Right o Compression Pump: Use compression pump on left lower extremity for 60 minutes, twice daily. o Compression Pump: Use compression pump on right lower extremity for 60 minutes, twice daily. Electronic Signature(s) Signed: 04/27/2020 3:45:53 PM By: Linton Ham MD [name] ([id number]) Signed: 04/27/2020 3:55:21 PM By: [name], [name], RN, CWS, [name], [name] Entered By: [name], [name], RN, CWS, Kim on 04/27/2020 13:06:37 DAMYIAH, MOXLEY ([id number]) -------------------------------------------------------------------------------- Problem List Details Patient Name: HADLYN, AMERO. Date of Service: 04/27/2020 12:30 PM Medical Record Number: [id number] Patient Account Number: [id number]  Date of Birth/Sex: 04-03-1946 (74 y.o. F) Treating RN: Cornell Barman Primary Care Provider: [name] Other Clinician: Referring Provider: [name] Treating Provider/Extender: Tito Dine in Treatment: 72 Active Problems ICD-10 Encounter Code Description Active Date MDM Diagnosis L97.221 Non-pressure chronic ulcer of left calf limited to breakdown of skin 01/07/2019 No Yes I89.0 Lymphedema, not elsewhere classified 12/10/2018 No Yes  I87.332 Chronic venous hypertension (idiopathic) with ulcer and inflammation of 12/09/2019 No Yes left lower extremity I83.009 Varicose veins of unspecified lower extremity with ulcer of unspecified 04/13/2020 No Yes site I82.592 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 12/09/2019 No Yes lower extremity Inactive Problems ICD-10 Code Description Active Date Inactive Date L97.818 Non-pressure chronic ulcer of other part of right lower leg with other specified 10/14/2019 10/14/2019 severity Resolved Problems ICD-10 Code Description Active Date Resolved Date L97.211 Non-pressure chronic ulcer of right calf limited to breakdown of skin 02/10/2020 02/10/2020 Electronic Signature(s) Signed: 04/27/2020 3:45:53 PM By: Linton Ham MD Entered By: Linton Ham on 04/27/2020 13:15:16 Haberl, [name] ([id number]) -------------------------------------------------------------------------------- Progress Note Details Patient Name: DORSIE, SETHI. Date of Service: 04/27/2020 12:30 PM Medical Record Number: [id number] Patient Account Number: [id number] Date of Birth/Sex: 20-Mar-1946 (74 y.o. F) Treating RN: Cornell Barman Primary Care Provider: [name] Other Clinician: Referring Provider: [name] Treating Provider/Extender: Tito Dine in Treatment: 72 Subjective History of Present Illness (HPI) Pleasant 74 year old with history of chronic venous insufficiency. No diabetes or peripheral vascular disease. Left ABI 1.29. Questionable history of left lower extremity DVT. She developed a recurrent ulceration on her left lateral calf in December 2015, which she attributes to poor diet and subsequent lower extremity edema. She underwent endovenous laser ablation of her left greater saphenous vein in 2010. She underwent laser ablation of accessory branch of left GSV in April 2016 by Dr. Kellie Simmering at [facility]. She was previously wearing [place] boots, which she  tolerated well. Tolerating 2 layer compression and cadexomer iodine. She returns to clinic for follow-up and is without new complaints. She denies any significant pain at this time. She reports persistent pain with pressure. No claudication or ischemic rest pain. No fever or chills. No drainage. READMISSION 11/13/16; this is a 74 year old woman who is not a diabetic. She is here for a review of a painful area on her left medial lower extremity. I note that she was seen here previously last year for wound I believe to be in the same area. At that time she had undergone previously a left greater saphenous vein ablation by Dr. Kellie Simmering and she had a ablation of the anterior accessory branch of the left greater saphenous vein in March 2016. Seeing that the wound actually closed over. In reviewing the history with her today the ulcer in this area has been recurrent. She describes a biopsy of this area in 2009 that only showed stasis physiology. 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 Do not see that she has ever had arterial studies in the left leg. She tells me she has a follow-up with Dr. Kellie Simmering in roughly 10 days In any case she developed the reopening of this area roughly a month ago. On the background of this she describes rapidly increasing edema which has responded to  Lasix 40 mg and metolazone 2.5 mg as well as the patient's lymph massage. She has been told she has both venous insufficiency and lymphedema but she cannot tolerate compression stockings 11/28/16; the patient saw Dr. Kellie Simmering recently. Per the patient he did arterial Dopplers in the office that did not show evidence of arterial insufficiency, per the patient he stated "treat this like an ordinary venous ulcer". She also  saw her dermatologist Dr. Ronnald Ramp who felt that this was more of a vascular ulcer. In general things are improving although she arrives today with increasing bilateral lower extremity edema with weeping a deeper fluid through the wound on the left medial leg compatible with some degree of lymphedema 12/04/16; the patient's wound is fully epithelialized but I don't think fully healed. We will do another week of depression with Promogran and TCA however I suspect we'll be able to discharge her next week. This is a very unusual-looking wound which was initially a figure-of-eight type wound lying on its side surrounded by petechial like hemorrhage. She has had venous ablation on this side. She apparently does not have an arterial issue per Dr. Kellie Simmering. She saw her dermatologist thought it was "vascular". Patient is definitely going to need ongoing compression and I talked about this with her today she will go to elastic therapy after she leaves here next week 12/11/16; the patient's wound is not completely closed today. She has surrounding scar tissue and in further discussion with the patient it would appear that she had ulcers in this area in 2009 for a prolonged period of time ultimately requiring a punch biopsy of this area that only showed venous insufficiency. I did not previously pickup on this part of the history from the patient. 12/18/16; the patient's wound is completely epithelialized. There is no open area here. She has significant bilateral venous insufficiency with secondary lymphedema to a mild-to-moderate degree she does not have compression stockings.. She did not say anything to me when I was in the room, she told our intake nurse that she was still having pain in this area. This isn't unusual recurrent small open area. She is going to go to elastic therapy to obtain compression stockings. 12/25/16; the patient's wound is fully epithelialized. There is no open area here. The patient describes  some continued episodic discomfort in this area medial left calf. However everything looks fine and healed here. She is been to elastic therapy and caught herself 15-20 mmHg stockings, they apparently were having trouble getting 20-30 mm stockings in her size 01/22/17; this is a patient we discharged from the clinic a month ago. She has a recurrent open wound on her medial left calf. She had 15 mm support stockings. I told her I thought she needed 20-30 mm compression stockings. She tells me that she has been ill with hospitalization secondary to asthma and is been found to have severe hypokalemia likely secondary to a combination of Lasix and metolazone. This morning she noted blistering and leaking fluid on the posterior part of her left leg. She called our intake nurse urgently and we was saw her this afternoon. She has not had any real discomfort here. I don't know that she's been wearing any stockings on this leg for at least 2-3 days. ABIs in this clinic were 1.21 on the right and 1.3 on the left. She is previously seen vascular surgery who does not think that there is a peripheral arterial issue. 01/30/17; Patient arrives with no open wound on the left  leg. She has been to elastic therapy and obtained 20-57mmhg below knee stockings and she has one on the right leg today. READMISSION 02/19/18; this Castell is a now 74 year old patient we've had in this clinic perhaps 3 times before. I had last looked at her from January 07 December 2016 with an area on the medial left leg. We discharged her on 12/25/16 however she had to be readmitted on 01/22/17 with a recurrence. I have in my notes that we discharged her on 20-30 mm stockings although she tells me she was only wearing support hose because she cannot get stockings on predominantly related to her cervical spine surgery/issues. She has had previous ablations done by vein and vascular in [name] including a great saphenous vein ablation on the left  with an anterior accessory branch ablation I think both of these were in 2016. On one of the previous visit she had a biopsy noted 2009 that was negative. She is not felt to have an arterial issue. She is not a diabetic. She does have a history of obstructive sleep apnea hypertension asthma as well as chronic venous insufficiency and lymphedema. On this occasion she noted 2 dry scaly patch on her left leg. She tried to put lotion on this it didn't really help. There were 2 open areas.the patient has been seeing her primary physician from 02/05/18 through 02/14/18. She had Unna boots applied. The superior wound now on the lateral left leg has closed but she's had one wound that remains open on the lateral left leg. This is not the same spot as we dealt with in 2018. ABIs in this clinic were 1.3 bilaterally AVALENE, SEALY ([id number]) 02/26/18; patient has a small wound on the left lateral calf. Dimensions are down. She has chronic venous insufficiency and lymphedema. 03/05/18; small open area on the left lateral calf. Dimensions are down. Tightly adherent necrotic debris over the surface of the wound which was difficult to remove. Also the dressing [over collagen] stuck to the wound surface. This was removed with some difficulty as well. Change the primary dressing to Hydrofera Blue ready 03/12/18; small open area on the left lateral calf. Comes in with tightly adherent surface eschar as well as some adherent Hydrofera Blue. 03/19/18; open area on the left lateral calf. Again adherent surface eschar as well as some adherent Hydrofera Blue nonviable subcutaneous tissue. She complained of pain all week even with the reduction from 4-3 layer compression I put on last week. Also she had an increase in her ankle and calf measurements probably related to the same thing. 03/26/18; open area on the left lateral calf. A very small open area remains here. We used silver alginate starting last week as the Hydrofera Blue  seem to stick to the wound bed. In using 4-layer compression 04/02/18; the open area in the left lateral calf at some adherent slough which I removed there is no open area here. We are able to transition her into her own compression stocking. Truthfully I think this is probably his support hose. However this does not maintain skin integrity will be limited. She cannot put over the toe compression stockings on because of neck problems hand problems etc. She is allergic to the lining layer of juxta lites. We might be forced to use extremitease stocking should this fail READMIT 11/24/2018 Patient is now a 74 year old woman who is not a diabetic. She has been in this clinic on at least 3 previous occasions largely with recurrent wounds on her  left leg secondary to chronic venous insufficiency with secondary lymphedema. Her situation is complicated by inability to get stockings on and an allergy to neoprene which is apparently a component and at least juxta lites and other stockings. As a result she really has not been wearing any stockings on her legs. She tells [place] that roughly 2 or 3 weeks ago she started noticing a stinging sensation just above her ankle on the left medial aspect. She has been diagnosed with pseudogout and she wondered whether this was what she was experiencing. She tried to dress this with something she bought at the store however subsequently it pulled skin off and now she has an open wound that is not improving. She has been using Vaseline gauze with a cover bandage. She saw her primary doctor last week who put an Haematologist on her. ABIs in this clinic was 1.03 on the left 2/12; the area is on the left medial ankle. Odd-looking wound with what looks to be surface epithelialization but a multitude of small petechial openings. This clearly not closed yet. We have been using silver alginate under 3 layer compression with TCA 2/19; the wound area did not look quite as good this week.  Necrotic debris over the majority of the wound surface which required debridement. She continues to have a multitude of what looked to be small petechial openings. She reminds [place] that she had a biopsy on this initially during her first outbreak in 2015 in [place] dermatology. She expresses concern about this being a possible melanoma. 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 I assured her that this does not look anything like melanoma. Except for the petechial reaction it does look like a venous insufficiency area and she certainly has evidence of this on both sides 2/26; a difficult area on the left medial ankle. The patient clearly has chronic venous hypertension with some degree of lymphedema. The odd thing about the area is the small petechial hemorrhages. I am not really sure how to explain this. This was present last time and this is not a compression injury. We have been using Hydrofera Blue which I changed to last week 3/4; still using Hydrofera Blue. Aggressive debridement today. She does not have known arterial issues. She has seen Dr. Kellie Simmering at [facility] vein and vascular and and has an ablation on the left. [Anterior accessory branch of the greater saphenous]. From what I remember they did not feel she had an arterial issue. The patient has had this area biopsied in 2009 at [facility] dermatology and by her recollection they said this was "stasis". She is also follow-up with dermatology locally who thought that this was more of a vascular issue 3/11; using Hydrofera Blue. Aggressive debridement today. She does not have an arterial issue. We are using 3 layer compression although we may need to go to 4. The patient has been in for multiple changes to her wrap since I last saw her a week ago. She says that the area was leaking. I do not have too much more information on what was found 01/19/19 on  evaluation today patient was actually being seen for a nurse visit when unfortunately she had the area on her left lateral lower extremity as well as weeping from the right lower extremity that became apparent. Therefore we did end up actually seeing her for a full visit with myself. She is having some pain at  this site as well but fortunately nothing too significant at this point. No fevers, chills, nausea, or vomiting noted at this time. 3/18-Patient is back to the clinic with the left leg venous leg ulcer, the ulcer is larger in size, has a surface that is densely adherent with fibrinous tissue, the Hydrofera Blue was used but is densely adherent and there was difficulty in removing it. The right lower extremity was also wrapped for weeping edema. Patient has a new area over the left lateral foot above the malleolus that is small and appears to have no debris with intact surrounding skin. Patient is on increased dose of Lasix also as a means to edema management 3/25; the patient has a nonhealing venous ulcer on the medial left leg and last week developed a smaller area on the lateral left calf. We have been using Hydrofera Blue with a contact layer. 4/1; no major change in these wounds areas. Left medial and more recently left lateral calf. I tried Iodoflex last week to aid in debridement she did not tolerate this. She stated her pain was terrible all week. She took the top layer of the 4 layer compression off. 4/8; the patient actually looks somewhat better in terms of her more prominent left lateral calf wound. There is some healthy looking tissue here. She is still complaining of a lot of discomfort. 4/15; patient in a lot of pain secondary to sciatica. She is on a prednisone taper prescribed by her primary physician. She has the 2 areas one on the left medial and more recently a smaller area on the left lateral calf. Both of these just above the malleoli 4/22; her back pain is better but she  still states she is very uncomfortable and now feels she is intolerant to the [employer]. No real change in the wounds we have been using Sorbact. She has been previously intolerant to Iodoflex. There is not a lot of option about what we can use to debride this wound under compression that she no doubt needs. sHe states Ultram no longer works for her pain 4/29; no major change in the wounds slightly increased depth. Surface on the original medial wound perhaps somewhat improved however the more recent area on the lateral left ankle is 100% covered in very adherent debris we have been using Sorbact. She tolerates 4 layer compression well and her edema control is a lot better. She has not had to come in for a nurse check 5/6; no major change in the condition of the wounds. She did consent to debridement today which was done with some difficulty. Continuing Sorbact. She did not tolerate Iodoflex. She was in for a check of her compression the day after we wrapped her last week this was adjusted but nothing much was found 5/13; no major change in the condition or area of the wounds. I was able to get a fairly aggressive debridement done on the lateral left leg wound. Even using Sorbact under compression. She came back in on Friday to have the wrap changed. She says she felt uncomfortable on the lateral aspect of her ankle. She has a long history of chronic venous insufficiency including previous ablation surgery on this side. 5/20-Patient returns for wounds on left leg with both wounds covered in slough, with the lateral leg wound larger in size, she has been in 3 layer compression and felt more comfortable, she describes pain in ankle, in leg and pins and needles in foot, and is about to try  Pamelor for this 6/3; wounds on the left lateral and left medial leg. The area medially which is the most recent of the 2 seems to have had the largest increase in Marina, MURDIS FLITTON. ([id number]) dimensions. We have been  using Sorbac to try and debride the surface. She has been to see orthopedics they apparently did a plain x-ray that was indeterminant. Diagnosed her with neuropathy and they have ordered an MRI to determine if there is underlying osteomyelitis. This was not high on my thought list but I suppose it is prudent. We have advised her to make an appointment with vein and vascular in [place]. She has a history of a left greater saphenous and accessory vein ablations I wonder if there is anything else that can be done from a surgical point of view to help in these difficult refractory wounds. We have previously healed this wound on one occasion but it keeps on reopening [medial side] 6/10; deep tissue culture I did last week I think on the left medial wound showed both moderate E. coli and moderate staph aureus [MSSA]. She is going to require antibiotics and I have chosen Augmentin. We have been using Sorbact and we have made better looking wound surface on both sides but certainly no improvement in wound area. She was back in last Friday apparently for a dressing changes the wrap was hurting her outer left ankle. She has not managed to get a hold of vein and vascular in [place]. We are going to have to make her that appointment 6/17; patient is tolerating the Augmentin. She had an MRI that I think was ordered by orthopedic surgeon this did not show osteomyelitis or an abscess did suggest cellulitis. We have been using Sorbact to the lateral and medial ankles. We have been trying to arrange a follow-up appointment with vein and vascular in [place] or did her original ablations. We apparently an area sent the request to vein and vascular in [facility] 6/24; patient has completed the Augmentin. We do not yet have a vein and vascular appointment in [place]. I am not sure what the issue is here we have asked her to call tomorrow. We are using Sorbact. Making some improvements and especially the medial  wound. Both surfaces however look better medial and lateral. 7/1; the patient has been in contact with vein and vascular in [place] but has not yet received an appointment. Using Sorbact we have gradually improve the wound surface with no improvement in surface area. She is approved for Apligraf but the wound surface still is not completely viable. She has not had to come in for a dressing change 7/8; the patient has an appointment with vein and vascular on 7/31 which is a Friday afternoon. She is concerned about getting back here for [place] to dress her wounds. I think it is important to have them goal for her venous reflux/history of ablations etc. to see if anything else can be done. She apparently tested positive for 1 of the blood tests with regards to lupus and saw a rheumatologist. He has raised the issue of vasculitis again. I have had this thought in the past however the evidence seems overwhelming that this is a venous reflux etiology. If the rheumatologist tells me there is clinical and laboratory investigation is positive for lupus I will rethink this. 7/15; the patient's wound surfaces are quite a bit better. The medial area which was her original wound now has no depth although the lateral wound which was the  more recent area actually appears larger. Both with viable surfaces which is indeed better. Using Sorbact. I wanted to use Apligraf on her however there is the issue of the vein and vascular appointment on 7/31 at 2:00 in the afternoon which would not allow her to get back to be rewrapped and they would no doubt remove the graft 7/22; the patient's wound surfaces have moderate amount of debris although generally look better. The lateral one is larger with 2 small satellite areas superiorly. We are waiting for her vein and vascular appointment on 7/31. She has been approved for Apligraf which I would like to use after th 7/29; wound surfaces have improved no debridement is required  we have been using Sorbact. She sees vein and vascular on Friday with this so question of whether anything can be done to lessen the likelihood of recurrence and/or speed the healing of these areas. She is already had previous ablations. She no doubt has severe venous hypertension 8/5-Patient returns at 1 week, she was in [place] for 3 days by her podiatrist, we have been using so backed to the wound, she has increased pain in both the wounds on the left lower leg especially the more distal one on the lateral aspect 8/12-Patient returns at 1 week and she is agreeable to having debridement in both wounds on her left leg today. We have been using Sorbact, and vascular studies were reviewed at last visit 8/19; the patient arrives with her wounds fairly clean and no debridement is required. We have used Sorbact which is really done a nice job in cleaning up these very difficult wound surfaces. The patient saw Dr. Donzetta Matters of vascular surgery on 7/31. He did not feel that there was an arterial component. He felt that her treated greater saphenous vein is adequately addressed and that the small saphenous vein did not appear to be involved significantly. She was also noted to have deep venous reflux which is not treatable. Dr. Donzetta Matters mentioned the possibility of a central obstructive component leading to reflux and he offered her central venography. She wanted to discuss this or think about it. I have urged her to go ahead with this. She has had recurrent difficult wounds in these areas which do heal but after months in the clinic. If there is anything that can be done to reduce the likelihood of this I think it is worth it. 9/2 she is still working towards getting follow-up with Dr. Donzetta Matters to schedule her CT. Things are quite a bit worse venography. I put Apligraf on 2 weeks ago on both wounds on the medial and lateral part of her left lower leg. She arrives in clinic today with 3 superficial additional wounds  above the area laterally and one below the wound medially. She describes a lot of discomfort. I think these are probably wrapped injuries. Does not look like she has cellulitis. 07/20/2019 on evaluation today patient appears to be doing somewhat poorly in regard to her lower extremity ulcers. She in fact showed signs of erythema in fact we may even be dealing with an infection at this time. Unfortunately I am unsure if this is just infection or if indeed there may be some allergic reaction that occurred as a result of the Apligraf application. With that being said that would be unusual but nonetheless not impossible in this patient is one who is unfortunately allergic to quite a bit. Currently we have been using the Sorbact which seems to do as well  as anything for her. I do think we may want to obtain a culture today to see if there is anything showing up there that may need to be addressed. 9/16; noted that last week the wounds look worse in 1 week follow-up of the Apligraf. Using Sorbact as of 2 days ago. She arrives with copious amounts of drainage and new skin breakdown on the back of the left calf. The wounds arm more substantial bilaterally. There is a fair amount of swelling in the left calf no overt DVT there is edema present I think in the left greater than right thigh. She is supposed to go on 9/28 for CT venography. The wounds on the medial and lateral calf are worse and she has new skin breakdown posteriorly at least new for me. This is almost developing into a circumferential wound area The Apligraf was taken off last week which I agree with things are not going in the right direction a culture was done we do not have that back yet. She is on Augmentin that she started 2 days ago 9/23; dressing was changed by her nurses on Monday. In general there is no improvement in the wound areas although the area looks less angry than last week. She did get Augmentin for MSSA cultured on the 14th.  She still appears to have too much swelling in the left leg even with 3 layer compression 9/30; the patient underwent her procedure on 9/28 by Dr. Donzetta Matters at vascular and vein specialist. She was discovered to have the common iliac vein measuring 12.2 mm but at the level of L4-L5 measured 3 mm. After stenting it measured 10 mm. 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 Rouleaux flow in the common femoral and femoral vein was observed much improved after stenting. We are using silver alginate to the wounds on the medial and lateral ankle on the left. 4 layer compression 10/7; the patient had fluid swelling around her knee and 4 layer compression. At the advice of vein and vascular this was reduced to 3 layer which she is tolerating better. We have been using silver alginate under 3 layer compression since last Friday 10/14; arrives with the areas on the left ankle looking a lot better. Inflammation in the area also a lot better. She came in for a nurse check on 10/9 10/21; continued nice improvement. Slight improvements in surface area of both the medial and lateral wounds on the left. A lot of the satellite lesions in the weeping erythema around these from stasis dermatitis is resolved. We have been using silver alginate ILANA, PREZIOSO ([id number]) 10/28; general improvement in the entire wound areas although not a lot of change in dimensions the wound certainly looks better. There is a lot less in terms of venous inflammation. Continue silver alginate this week however look towards Hydrofera Blue next week 11/4; very adherent debris on the medial wound left wound is not as bad. We have been using silver alginate. Change to [facility] today 11/11; very adherent debris on both wound areas. She went to vein and vascular last week and follow-up they put in [place] boot on this today. He says the [facility] was adherent. Wound is definitely not as good as last week. Especially on the  left there the satellite lesions look more prominent 11/18; absolutely no better. erythema on lateral aspect with tenderness. 09/30/2019 on evaluation today patient appears to actually be doing better. Dr. Dellia Nims did put her on doxycycline last  week which I do believe has helped her at this point. Fortunately there is no signs of active infection at this time. No fevers, chills, nausea, vomiting, or diarrhea. I do believe he may want extend the doxycycline for 7 additional days just to ensure everything does completely cleared up the patient is in agreement with that plan. Otherwise she is going require some sharp debridement today 12/2; patient is completing a 2-week course of doxycycline. I gave her this empirically for inflammation as well as infection when I last saw her 2 weeks ago. All of this seems to be better. She is using silver alginate she has the area on the medial aspect of the larger area laterally and the 2 small satellite regions laterally above the major wound. 12/9; the patient's wound on the left medial and left lateral calf look really quite good. We have been using silver alginate. She saw vein and vascular in follow-up on 10/09/2019. She has had a previous left greater saphenous vein ablation by Dr. Oscar La in 2016. More recently she underwent a left common iliac vein stent by Dr. 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 The swelling is improved and certainly the wounds have improved. The patient shows [place] today area on the right medial calf there is almost no wound but leaking lymphedema. She says she start this started 3 or 4 days ago. She did not traumatize it. It is not painful. She does not wear compression on that side 12/16; the patient continues to do well laterally. Medially still requiring debridement. The area on the right calf did not materialize to anything and is not currently open. We wrapped this last time. She has support stockings for that leg although  I am not sure they are going to provide adequate compression 12/23; the lateral wound looks stable. Medially still requiring debridement for tightly adherent fibrinous debris. We've been using silver alginate. Surface area not any different 12/30; neither wound is any better with regards to surface and the area on the left lateral is larger. I been using silver alginate to the left lateral which look quite good last week and Sorbact to the left medial 11/11/2019. Lateral wound area actually looks better and somewhat smaller. Medial still requires a very aggressive debridement today. We have been using Sorbact on both wound areas 1/13; not much better still adherent debris bilaterally. I been using Sorbact. She has severe venous hypertension. Probably some degree of dermal fibrosis distally. I wonder whether tighter compression might help and I am going to try that today. We also need to work on the bioburden 1/20; using Sorbact. She has severe venous hypertension status post stent placement for pelvic vein compression. We applied gentamicin last time to see if we could reduce bioburden I had some discussion with her today about the use of pentoxifylline. This is occasionally used in this setting for wounds with refractory venous insufficiency. However this interacts with Plavix. She tells me that she was put on this after stent placement for 3 months. She will call Dr. Claretha Cooper office to discuss 1/27; we are using gentamicin under Sorbact. 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 She has a stent. Wound medially is measuring smaller this week. Laterally measuring slightly larger although she has some satellite lesions superiorly 2/3; gentamicin under Sorbact under 4-layer compression. 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 She has a stent on Plavix. Her wounds are measuring smaller this week. More substantially laterally where there  is a  satellite lesion superiorly. 2/10; gentamicin under Sorbac. 4-layer compression. Patient communicated with Dr. Donzetta Matters at [facility] in [place]. He is okay with the patient coming off Plavix I will therefore start her on pentoxifylline for a 1 month trial. In general her wounds look better today. I had some concerns about swelling in the left thigh however she measures 61.5 on the right and 63 on the mid thigh which does not suggest there is any difficulty. The patient is not describing any pain. 2/17; gentamicin under Sorbac 4-layer compression. She has been on pentoxifylline for 1 week and complains of loose stool. No nausea she is eating and drinking well 2/24; the patient apparently came in 2 days ago for a nurse visit when her wrap fell down. Both areas look a little worse this week macerated medially and satellite lesions laterally. Change to silver alginate today 3/3; wounds are larger today especially medially. She also has more swelling in her foot lower leg and I even noted some swelling in her posterior thigh which is tender. I wonder about the patency of her stent. Fortuitously she sees Dr. Claretha Cooper group on Friday 3/10; Mrs. Falcon was seen by [facility] on 3/5. The patient underwent ultrasound. There was no evidence of thrombosis involving the IVC no evidence of thrombosis involving the right common iliac vein there is no evidence of thrombosis involving the right external iliac vein the left external vein is also patent. The right common iliac vein stent appears patent bilateral common femoral veins are compressible and appear patent. I was concerned about the left common iliac stent however it looks like this is functional. She has some edema in the posterior thigh that was tender she still has that this week. I also note they had trouble finding the pulses in her left foot and booked her for an ABI baseline in 4 weeks. She will follow up in 6 months for repeat IVC  duplex. The patient stopped the pentoxifylline because of diarrhea. It does not look like that was being effective in any case. I have advised her to go back on her aspirin 81 mg tablet, vascular it also suggested this 3/17; comes in today with her wound surfaces a lot better. The excoriations from last week considerably better probably secondary to the TCA. We have been using silver alginate 3/24; comes in today with smaller wounds both medially and laterally. Both required debridement. There are 2 small satellite areas superiorly laterally. She also has a very odd bandlike area in the mid calf almost looking like there was a weakness in the wrap in a localized area. I would write this off as being this however anteriorly she has a small raised ballotable area that is very tender almost reminiscent of an abscess but there was no obvious purulent surface to it. 02/04/20 upon evaluation today patient appears to be doing fairly well in regard to her wounds today. Fortunately there is no signs of active infection at this time. No fevers, chills, nausea, vomiting, or diarrhea. She has been tolerating the dressing changes without complication. Fortunately I feel like she is showing signs of improvement although has been sometime since have seen her. Nonetheless the area of concern that Dr. Dellia Nims had last week where she had possibly an area of the wrap that was we can allow the leg to bulge appears to be doing significantly better today there is no signs of anything worsening. JUBILEE, VIVERO ([id number]) 4/7; the patient's wounds  on her medial and lateral left leg continue to contract. We have been using a regular alginate. Last week she developed an area on the right medial lower leg which is probably a venous ulcer as well. 4/14; the wounds on her left medial and lateral lower leg continue to contract. Surface eschar. We have been using regular alginate. The area on the right medial lower leg is  closed. We have been putting both legs under 4-layer contraction. The patient went back to see vein and vascular she had arterial studies done which were apparently "quite good" per the patient although I have not read their notes I have never felt she had an arterial issue. 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 This is been longstanding and refractory to exercise, leg elevation and longstanding use of compression wraps in our clinic as well as compression stockings on the times we have been able to get these to heal 4/21; we thought she actually might be close this week however she arrives in clinic with a lot of edema in her upper left calf and into her posterior thigh. This is been an intermittent problem here. She says the wrap fell down but it was replaced with a nurse visit on Monday. We are using calcium alginate to the wounds and the wound sizes there not terribly larger than last week but there is a lot more edema 4/28; again wound edges are smaller on both sides. Her edema is better controlled than last time. She is obtained her compression pumps from [employer] although they have not been to her home to set these up. 5/5; left medial and left lateral both look stable. I am not sure the medial is any smaller. We have been using calcium alginate under 4-layer compression. She had an area on the right medial. This was eschared today. We have been wrapping this as well. She does not tolerate external compression stockings due to a history of various contact allergies. She has her compression pumps however the representative from the company is coming on her to show her how to use these tomorrow 5/19; patient with severe chronic venous insufficiency secondary to central venous disease. She had a stent placed in her left common iliac vein. She has done better since but still difficult to control wounds. She comes in today with nothing  open on the right leg. Her areas on the left medial and left lateral are just about closed. We are using calcium alginate under 4-layer compression. She is using her external compression pumps at home She only has 15-20 support stockings. States she cannot get anything tighter than that on. 03/30/20-Patient returns at 1 week, the wounds on the left leg are both slightly bigger, the last week she was on 3 layer compression which started to slide down. She is starting to use her lymphedema pumps although she stated on 1 day her right ankle started to swell up and she have to stop that day. Unfortunately the open area seem to oscillate between improving to the point of healing and then flaring up all to do with effectiveness of compression or lack of due to the left leg topography not keeping the compression wraps from rolling down 6/2 patient comes in with a 15/20 mmHg stocking on the right leg. She tells me that she developed a lot of swelling in her ankles she saw orthopedics she was felt to possibly be having a flare of pseudogout versus some other type of  arthritis. She was put on steroids for a respiratory issue so that helps with the inflammation. She has not been using the pumps all week. She thinks the left thigh is more swollen than usual and I would agree with that. She has an appointment with Dr. Donzetta Matters 9 days or so from now 6/9; both wounds on the left medial and left lateral are smaller. We have been using calcium alginate under compression. She does not have an open wound on the right leg she is using a stocking and her compression pumps things are going well. She has an appointment with Dr. Donzetta Matters with regards to her stent in the left common iliac vein 6/16; the wounds on the left medial and left lateral ankle continues to contract. The patient saw Dr. Donzetta Matters and I think he seems satisfied. Ordered follow-up venous reflux studies on both sides in September. Cautioned that she may need  thigh-high stockings. She has been using calcium alginate under compression on the left and her own stocking on the right leg. She tells [place] there are no open wounds on the right 6/23; left lateral is just about closed. Medial required debridement today. We have been using calcium alginate. Extensive discussion about the compression pumps she is only using these on 25 mmHg states she could not take 40 or 30 when the wrap came out to her home to demonstrate these. He said they should not feel tight Objective Constitutional Vitals Time Taken: 12:32 PM, Height: 63 in, Weight: 224.7 lbs, BMI: 39.8, Temperature: 98.2 F, Pulse: 84 bpm, Respiratory Rate: 18 breaths/min, Blood Pressure: 141/69 mmHg. Integumentary (Hair, Skin) Wound #5 status is Open. Original cause of wound was Gradually Appeared. The wound is located on the Left,Medial Lower Leg. The wound measures 1.4cm length x 1cm width x 0.1cm depth; 1.1cm^2 area and 0.11cm^3 volume. Wound #6 status is Open. Original cause of wound was Gradually Appeared. The wound is located on the Left,Lateral Lower Leg. The wound measures 0.8cm length x 1.2cm width x 0.1cm depth; 0.754cm^2 area and 0.075cm^3 volume. Assessment Active Problems ICD-10 Non-pressure chronic ulcer of left calf limited to breakdown of skin IDAMAY, HOSEIN. 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 There was a Four Layer Compression Therapy Procedure with a pre-treatment ABI of 1 by Cornell Barman, RN. Post procedure Diagnosis Wound #5: Same as Pre-Procedure Plan Anesthetic (add to Medication List): Wound #5  Left,Medial Lower Leg: Topical Lidocaine 4% cream applied to wound bed prior to debridement (In Clinic Only). Wound #6 Left,Lateral Lower Leg: Topical Lidocaine 4% cream applied to wound bed prior to debridement (In Clinic Only). Primary Wound Dressing: Wound #5 Left,Medial Lower Leg: Hydrafera Blue Ready Transfer Wound #6 Left,Lateral Lower Leg: Hydrafera Blue Ready Transfer Secondary Dressing: Wound #5 Left,Medial Lower Leg: ABD pad Wound #6 Left,Lateral Lower Leg: ABD pad Dressing Change Frequency: Wound #5 Left,Medial Lower Leg: Change dressing every week Other: - Nurse visit Friday and Monday if needed. Wound #6 Left,Lateral Lower Leg: Change dressing every week Other: - Nurse visit Friday and Monday if needed. Follow-up Appointments: Wound #5 Left,Medial Lower Leg: Return Appointment in 1 week. Nurse Visit as needed Wound #6 Left,Lateral Lower Leg: Return Appointment in  1 week. Nurse Visit as needed Edema Control: Wound #5 Left,Medial Lower Leg: 4-Layer Compression System - Left Lower Extremity. Patient to wear own compression stockings - On Right Compression Pump: Use compression pump on left lower extremity for 60 minutes, twice daily. Compression Pump: Use compression pump on right lower extremity for 60 minutes, twice daily. Wound #6 Left,Lateral Lower Leg: 4-Layer Compression System - Left Lower Extremity. Patient to wear own compression stockings - On Right Compression Pump: Use compression pump on left lower extremity for 60 minutes, twice daily. Compression Pump: Use compression pump on right lower extremity for 60 minutes, twice daily. 1. I change the primary dressing to Hydrofera Blue to help with some debridement of the medial wound on the left ankle 2. Asked her to try and increase her compression pump to 30 mmHg this is far less than usual which may account for the lack of good edema control. Electronic Signature(s) Signed: 05/04/2020 2:33:45 PM By: [name],  [name], RN, CWS, [name], [name] [address], [name] ([id number]) Signed: 05/04/2020 2:35:18 PM By: Linton Ham MD Previous Signature: 04/28/2020 10:34:18 AM Version By: [name] [name], RN, CWS, [name], [name] Previous Signature: 05/03/2020 11:58:22 AM Version By: Linton Ham MD Previous Signature: 04/27/2020 3:45:53 PM Version By: Linton Ham MD Entered By: [name], [name], RN, CWS, Kim on 05/04/2020 14:33:44 Hehr, [name] ([id number]) -------------------------------------------------------------------------------- SuperBill Details Patient Name: DAJANAE, BROPHY. Date of Service: 04/27/2020 Medical Record Number: [id number] Patient Account Number: [id number] Date of Birth/Sex: [birth year] (74 y.o. 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 Facility Procedures CPT4 Code: 16553748 Description: (Facility Use Only) 29581LT - [facility] LWR LT LEG Modifier: Quantity: 1 Physician Procedures CPT4 Code: 2707867 Description: 54492 - WC PHYS LEVEL 3 - EST PT Modifier: Quantity: 1 CPT4 Code: Description: ICD-10 Diagnosis Description L97.221 Non-pressure chronic ulcer of left calf limited to breakdown of skin Modifier: Quantity: Electronic Signature(s) Signed: 05/04/2020 2:33:12 PM By: [name], [name], RN, CWS, [name], [name] Signed: 05/04/2020 2:35:18 PM By: Linton Ham MD Previous Signature: 05/04/2020 2:32:15 PM  Version By: [name] [name], RN, CWS, [name], [name] Previous Signature: 04/27/2020 3:45:53 PM Version By: Linton Ham MD Entered By: [name], [name], RN, CWS, Kim on 05/04/2020 14:33:12

## 2020-05-02 ENCOUNTER — Other Ambulatory Visit: Payer: Self-pay

## 2020-05-02 DIAGNOSIS — L97221 Non-pressure chronic ulcer of left calf limited to breakdown of skin: Secondary | ICD-10-CM | POA: Diagnosis not present

## 2020-05-02 NOTE — Progress Notes (Addendum)
VALINCIA, TOUCH ([id number]) Visit Report for 05/02/2020 Arrival Information Details Patient Name: [name], [name]. Date of Service: 05/02/2020 4:00 PM Medical Record Number: [id number] Patient Account Number: [id number] Date of Birth/Sex: 1946/06/05 (74 y.o. F) Treating RN: Cornell Barman Primary Care Walker Sitar: [name] Other Clinician: Referring Julya Alioto: [name] Treating Kadeshia Kasparian/Extender: Melburn Hake, HOYT Weeks in Treatment: 76 Visit Information History Since Last Visit Has Dressing in Place as Prescribed: Yes Patient Arrived: Ambulatory Has Compression in Place as Prescribed: Yes Arrival Time: 16:01 Pain Present Now: No Accompanied By: self Transfer Assistance: None Patient Identification Verified: Yes Secondary Verification Process Completed: Yes Patient Requires Transmission-Based No Precautions: Patient Has Alerts: Yes Patient Alerts: Patient on Blood Thinner aspirin 81 Electronic Signature(s) Signed: 05/02/2020 5:17:13 PM By: [name], [name], RN, CWS, [name], [name] Entered By: [name], [name], RN, CWS, Kim on 05/02/2020 17:17:12 Bonnie, [name] ([id number]) -------------------------------------------------------------------------------- Compression Therapy Details Patient Name: [name], [name]. Date of Service: 05/02/2020 4:00 PM Medical Record Number: [id number] Patient Account Number: [id number] Date of Birth/Sex: [birth year] (74 y.o. F) Treating RN: Cornell Barman Primary Care Shaka Cardin: [name] Other Clinician: Referring Zaim Nitta: [name] Treating Patria Warzecha/Extender: Melburn Hake, HOYT Weeks in Treatment: 72 Compression Therapy Performed for Wound Assessment: Wound #5 Left,Medial Lower Leg Performed By: Clinician Cornell Barman, RN Compression Type: Three Layer Pre Treatment ABI: 1 Electronic Signature(s) Signed: 05/02/2020 5:17:50 PM By: [name], [name], RN, CWS, [name], [name] Entered By: [name], [name], RN, CWS, Kim on 05/02/2020 17:17:50 Centola, [name]  ([id number]) -------------------------------------------------------------------------------- Encounter Discharge Information Details Patient Name: [name], [name]. Date of Service: 05/02/2020 4:00 PM Medical Record Number: [id number] Patient Account Number: [id number] Date of Birth/Sex: [birth year] (74 y.o. F) Treating RN: Cornell Barman Primary Care Argentina Kosch: [name] Other Clinician: Referring Caydin Yeatts: [name] Treating Bay Jarquin/Extender: Melburn Hake, HOYT Weeks in Treatment: 55 Encounter Discharge Information Items Discharge Condition: Stable Ambulatory Status: Ambulatory Discharge Destination: Home Transportation: Private Auto Accompanied By: self Schedule Follow-up Appointment: Yes Clinical Summary of Care: Electronic Signature(s) Signed: 05/02/2020 5:19:21 PM By: [name], [name], RN, CWS, [name], [name] Entered By: [name], [name], RN, CWS, Kim on 05/02/2020 17:19:21 [name] ([id number]) -------------------------------------------------------------------------------- Wound Assessment Details Patient Name: [name], [name]. Date of Service: 05/02/2020 4:00 PM Medical Record Number: [id number] Patient Account Number: [id number] Date of Birth/Sex: 1946/03/09 (74 y.o. F) Treating RN: Cornell Barman Primary Care Lacharles Altschuler: [name] Other Clinician: Referring Asriel Westrup: [name] Treating Anahita Cua/Extender: Melburn Hake, HOYT Weeks in Treatment: 72 Wound Status Wound Number: 5 Primary Etiology: Lymphedema Wound Location: Left, Medial Lower Leg Wound Status: Open Wounding Event: Gradually Appeared Date Acquired: 11/19/2018 Weeks Of Treatment: 72 Clustered Wound: No Wound Measurements Length: (cm) 1.4 Width: (cm) 1 Depth: (cm) 0.1 Area: (cm) 1.1 Volume: (cm) 0.11 % Reduction in Area: 87.1% % Reduction in Volume: 87.1% Wound Description Classification: Full Thickness Without Exposed Support Structure s Treatment Notes Wound #5 (Left, Medial Lower  Leg) Notes Hydrofera Blue, ABD, 4 layer Electronic Signature(s) Signed: 05/03/2020 12:55:53 PM By: [name], [name], RN, CWS, [name], [name] Entered By: [name], [name], RN, CWS, Kim on 05/02/2020 17:17:28 Gasner, [name] ([id number]) -------------------------------------------------------------------------------- Wound Assessment Details Patient Name: [name], [name]. Date of Service: 05/02/2020 4:00 PM Medical Record Number: [id number] Patient Account Number: [id number] Date of Birth/Sex: [birth year] (74 y.o. F) Treating RN: Cornell Barman Primary Care Keats Kingry: [name] Other Clinician: Referring Kimbrely Buckel: [name] Treating Sofia Vanmeter/Extender: STONE III, HOYT Weeks in Treatment: 72 Wound Status Wound Number: 6 Primary Etiology: Venous Leg Ulcer Wound Location:  Left, Lateral Lower Leg Wound Status: Open Wounding Event: Gradually Appeared Date Acquired: 01/19/2019 Weeks Of Treatment: 67 Clustered Wound: No Wound Measurements Length: (cm) 0.8 Width: (cm) 1.2 Depth: (cm) 0.1 Area: (cm) 0.754 Volume: (cm) 0.075 % Reduction in Area: -242.7% % Reduction in Volume: -240.9% Wound Description Classification: Full Thickness Without Exposed Support Structure s Treatment Notes Wound #6 (Left, Lateral Lower Leg) Notes Hydrofera Blue, ABD, 4 layer Electronic Signature(s) Signed: 05/03/2020 12:55:53 PM By: [name], [name], RN, CWS, [name], [name] Entered By: [name], [name], RN, CWS, Kim on 05/02/2020 17:17:28

## 2020-05-03 DIAGNOSIS — Z1231 Encounter for screening mammogram for malignant neoplasm of breast: Secondary | ICD-10-CM | POA: Diagnosis not present

## 2020-05-03 LAB — HM MAMMOGRAPHY

## 2020-05-04 ENCOUNTER — Other Ambulatory Visit: Payer: Self-pay

## 2020-05-04 ENCOUNTER — Encounter: Payer: Medicare Other | Admitting: Internal Medicine

## 2020-05-04 ENCOUNTER — Other Ambulatory Visit: Payer: Self-pay | Admitting: *Deleted

## 2020-05-04 DIAGNOSIS — L97221 Non-pressure chronic ulcer of left calf limited to breakdown of skin: Secondary | ICD-10-CM | POA: Diagnosis not present

## 2020-05-04 DIAGNOSIS — I87332 Chronic venous hypertension (idiopathic) with ulcer and inflammation of left lower extremity: Secondary | ICD-10-CM | POA: Diagnosis not present

## 2020-05-04 DIAGNOSIS — I83009 Varicose veins of unspecified lower extremity with ulcer of unspecified site: Secondary | ICD-10-CM | POA: Diagnosis not present

## 2020-05-04 DIAGNOSIS — I82592 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: Secondary | ICD-10-CM | POA: Diagnosis not present

## 2020-05-04 DIAGNOSIS — C4361 Malignant melanoma of right upper limb, including shoulder: Secondary | ICD-10-CM

## 2020-05-05 ENCOUNTER — Inpatient Hospital Stay: Payer: Medicare Other | Attending: Hematology & Oncology | Admitting: Hematology & Oncology

## 2020-05-05 ENCOUNTER — Encounter: Payer: Self-pay | Admitting: Family Medicine

## 2020-05-05 ENCOUNTER — Encounter: Payer: Self-pay | Admitting: Hematology & Oncology

## 2020-05-05 ENCOUNTER — Inpatient Hospital Stay: Payer: Medicare Other

## 2020-05-05 VITALS — BP 139/61 | HR 74 | Temp 96.8°F | Resp 18 | Wt 230.0 lb

## 2020-05-05 DIAGNOSIS — Z85820 Personal history of malignant melanoma of skin: Secondary | ICD-10-CM | POA: Insufficient documentation

## 2020-05-05 DIAGNOSIS — C4361 Malignant melanoma of right upper limb, including shoulder: Secondary | ICD-10-CM

## 2020-05-05 DIAGNOSIS — Z923 Personal history of irradiation: Secondary | ICD-10-CM | POA: Diagnosis not present

## 2020-05-05 DIAGNOSIS — Z9221 Personal history of antineoplastic chemotherapy: Secondary | ICD-10-CM | POA: Insufficient documentation

## 2020-05-05 LAB — CBC WITH DIFFERENTIAL (CANCER CENTER ONLY)
Abs Immature Granulocytes: 0.04 10*3/uL (ref 0.00–0.07)
Basophils Absolute: 0 10*3/uL (ref 0.0–0.1)
Basophils Relative: 1 %
Eosinophils Absolute: 0.2 10*3/uL (ref 0.0–0.5)
Eosinophils Relative: 3 %
HCT: 41.2 % (ref 36.0–46.0)
Hemoglobin: 13 g/dL (ref 12.0–15.0)
Immature Granulocytes: 1 %
Lymphocytes Relative: 23 %
Lymphs Abs: 1.5 10*3/uL (ref 0.7–4.0)
MCH: 29.7 pg (ref 26.0–34.0)
MCHC: 31.6 g/dL (ref 30.0–36.0)
MCV: 94.1 fL (ref 80.0–100.0)
Monocytes Absolute: 0.5 10*3/uL (ref 0.1–1.0)
Monocytes Relative: 8 %
Neutro Abs: 4.1 10*3/uL (ref 1.7–7.7)
Neutrophils Relative %: 64 %
Platelet Count: 188 10*3/uL (ref 150–400)
RBC: 4.38 MIL/uL (ref 3.87–5.11)
RDW: 13.4 % (ref 11.5–15.5)
WBC Count: 6.4 10*3/uL (ref 4.0–10.5)
nRBC: 0 % (ref 0.0–0.2)

## 2020-05-05 LAB — CMP (CANCER CENTER ONLY)
ALT: 16 U/L (ref 0–44)
AST: 11 U/L — ABNORMAL LOW (ref 15–41)
Albumin: 4.4 g/dL (ref 3.5–5.0)
Alkaline Phosphatase: 60 U/L (ref 38–126)
Anion gap: 7 (ref 5–15)
BUN: 18 mg/dL (ref 8–23)
CO2: 31 mmol/L (ref 22–32)
Calcium: 10 mg/dL (ref 8.9–10.3)
Chloride: 105 mmol/L (ref 98–111)
Creatinine: 0.77 mg/dL (ref 0.44–1.00)
GFR, Est AFR Am: >60 mL/min (ref >60)
GFR, Estimated: >60 mL/min (ref >60)
Glucose, Bld: 97 mg/dL (ref 70–99)
Potassium: 3.6 mmol/L (ref 3.5–5.1)
Sodium: 143 mmol/L (ref 135–145)
Total Bilirubin: 0.5 mg/dL (ref 0.3–1.2)
Total Protein: 7.3 g/dL (ref 6.5–8.1)

## 2020-05-05 LAB — LACTATE DEHYDROGENASE: LDH: 207 U/L — ABNORMAL HIGH (ref 98–192)

## 2020-05-05 NOTE — Progress Notes (Signed)
Hematology and Oncology Follow Up Visit  [name] [id number] 1946-03-01 74 y.o. 05/05/2020   Principle Diagnosis:  Stage IIIC (T3b N3 M0) melanoma of the right shoulder  Current Therapy:   Observation   Interim History:  Ms. Thaw is here today for a long awaited follow-up.  She was last seen about a year and a half ago.  She cannot make it in because of the pandemic.  Thankfully, where she lives, there really is no problem with having social distancing observed.  She is done well since we last saw her.  She now has about a dozen cats that she is taking care of.  She had a small family to begin with and then they kept having litters.  She follows able to get the cats fixed.  She really is enjoying the antics that the cats are involved with.  As far as her health goes, she is doing pretty well.  She has a neck fusion.  She is doing okay with the neck.  She really has not had a lot of pain with that.  She does have stiffness.  She has had no change in bowel or bladder habits.  She has had no cough or shortness of breath.  The think she recently had a mammogram done.  There has been no problems with fever.  She has had no bleeding.  There is been no leg swelling.  She does have a wrap around her left leg.  This is been chronic.  Overall, her performance status right now is ECOG 1-2.    Medications:  Allergies as of 05/05/2020      Reactions   Sulfa Antibiotics Itching, Swelling, Shortness Of Breath   Facial swelling   Voltaren [diclofenac Sodium] Shortness Of Breath   Latex Other (See Comments)   Blisters ONLY HAD REACTION TO TAPE  (??? ONLY ADHESIVE ALLERGY)   Tape Other (See Comments)   Caused blisters - must use paper tape - same reaction from NeoPrene   Doxycycline Other (See Comments)   Diaphoresis and dizziness   Other    Neoprene      Medication List       Accurate as of May 05, 2020 11:38 AM. If you have any questions, ask your nurse or doctor.          albuterol 108 (90 Base) MCG/ACT inhaler Commonly known as: VENTOLIN HFA INHALE 2 PUFFS BY MOUTH EVERY 6 HORUS AS NEEDED FOR WHEEZING/SHORTNESS OF BREATH   aspirin EC 81 MG tablet Take 81 mg by mouth at bedtime. Melanoma prevention   famotidine 20 MG tablet Commonly known as: Pepcid Take 1 tablet (20 mg total) by mouth at bedtime.   furosemide 40 MG tablet Commonly known as: LASIX TAKE 1 TABLET BY MOUTH EVERY DAY   loratadine 10 MG tablet Commonly known as: CLARITIN Take 10 mg by mouth daily.   losartan 25 MG tablet Commonly known as: COZAAR TAKE 2 TABLETS BY MOUTH EVERY DAY   meloxicam 15 MG tablet Commonly known as: MOBIC TAKE 1 TABLET BY MOUTH EVERY DAY AS NEEDED FOR PAIN   montelukast 10 MG tablet Commonly known as: SINGULAIR TAKE 1 TABLET BY MOUTH EVERY DAY   multivitamin with minerals Tabs tablet Take 1 tablet by mouth daily.   Nasacort Allergy 24HR 55 MCG/ACT Aero nasal inhaler Generic drug: triamcinolone Place 2 sprays into the nose daily. In each nostril   potassium chloride SA 20 MEQ tablet Commonly known as: KLOR-CON Take 1  tablet (20 mEq total) by mouth 2 (two) times daily.   PRESCRIPTION MEDICATION CPAP   PROBIOTIC DAILY PO Take 1 tablet by mouth daily.   TEARS PLUS OP Place 1 drop into both eyes daily as needed (dry eyes/ redness/ burning).   Tylenol Arthritis Pain 650 MG CR tablet Generic drug: acetaminophen Take 1,300 mg by mouth every 8 (eight) hours as needed for pain.   UNABLE TO FIND Take by mouth.   Vitamin D (Ergocalciferol) 1.25 MG (50000 UNIT) Caps capsule Commonly known as: DRISDOL TAKE 1 CAPSULE (50,000 UNITS TOTAL) BY MOUTH EVERY 7 (SEVEN) DAYS.       Allergies:  Allergies  Allergen Reactions   Sulfa Antibiotics Itching, Swelling and Shortness Of Breath    Facial swelling   Voltaren [Diclofenac Sodium] Shortness Of Breath   Latex Other (See Comments)    Blisters ONLY HAD REACTION TO TAPE  (??? ONLY ADHESIVE  ALLERGY)   Tape Other (See Comments)    Caused blisters - must use paper tape - same reaction from NeoPrene   Doxycycline Other (See Comments)    Diaphoresis and dizziness   Other     Neoprene    Past Medical History, Surgical history, Social history, and Family History were reviewed and updated.  Review of Systems: Review of Systems  Constitutional: Negative.   HENT: Negative.   Eyes: Negative.   Respiratory: Negative.   Cardiovascular: Positive for leg swelling.  Gastrointestinal: Negative.   Genitourinary: Negative.   Musculoskeletal: Positive for back pain and joint pain.  Skin: Negative.   Neurological: Negative.   Endo/Heme/Allergies: Negative.   Psychiatric/Behavioral: Negative.       Physical Exam:  vitals were not taken for this visit.   Wt Readings from Last 3 Encounters:  04/15/20 225 lb (102.1 kg)  04/12/20 224 lb 7 oz (101.8 kg)  03/22/20 227 lb (103 kg)    Physical Exam Vitals reviewed.  HENT:     Head: Normocephalic and atraumatic.  Eyes:     Pupils: Pupils are equal, round, and reactive to light.  Cardiovascular:     Rate and Rhythm: Normal rate and regular rhythm.     Heart sounds: Normal heart sounds.  Pulmonary:     Effort: Pulmonary effort is normal.     Breath sounds: Normal breath sounds.  Abdominal:     General: Bowel sounds are normal.     Palpations: Abdomen is soft.  Musculoskeletal:        General: No tenderness or deformity. Normal range of motion.     Cervical back: Normal range of motion.     Comments: She has bilateral lower extremity edema.  This seems to be mostly in the calf area.  I do not palpate any obvious venous cord.  She has no obvious popliteal cyst.  Lymphadenopathy:     Cervical: No cervical adenopathy.  Skin:    General: Skin is warm and dry.     Findings: No erythema or rash.  Neurological:     Mental Status: She is alert and oriented to person, place, and time.  Psychiatric:        Behavior: Behavior  normal.        Thought Content: Thought content normal.        Judgment: Judgment normal.      Lab Results  Component Value Date   WBC 6.4 05/05/2020   HGB 13.0 05/05/2020   HCT 41.2 05/05/2020   MCV 94.1 05/05/2020   PLT 188  05/05/2020   Lab Results  Component Value Date   FERRITIN 105 08/21/2013   IRON 73 08/21/2013   TIBC 280 08/21/2013   UIBC 207 08/21/2013   IRONPCTSAT 26 08/21/2013   Lab Results  Component Value Date   RBC 4.38 05/05/2020   No results found for: KPAFRELGTCHN, LAMBDASER, KAPLAMBRATIO No results found for: IGGSERUM, IGA, IGMSERUM No results found for: Odetta Pink, SPEI   Chemistry      Component Value Date/Time   NA 142 10/07/2019 1003   NA 149 (H) 08/15/2017 1039   NA 143 09/17/2016 1018   K 4.2 10/07/2019 1003   K 4.1 08/15/2017 1039   K 3.5 09/17/2016 1018   CL 104 10/07/2019 1003   CL 107 08/15/2017 1039   CO2 30 10/07/2019 1003   CO2 29 08/15/2017 1039   CO2 28 09/17/2016 1018   BUN 13 10/07/2019 1003   BUN 14 08/15/2017 1039   BUN 19.5 09/17/2016 1018   CREATININE 0.71 10/07/2019 1003   CREATININE 0.77 08/14/2018 1013   CREATININE 1.0 08/15/2017 1039   CREATININE 0.8 09/17/2016 1018      Component Value Date/Time   CALCIUM 9.7 10/07/2019 1003   CALCIUM 9.7 08/15/2017 1039   CALCIUM 9.8 09/17/2016 1018   ALKPHOS 60 08/14/2018 1013   ALKPHOS 48 08/15/2017 1039   ALKPHOS 71 09/17/2016 1018   AST 15 08/14/2018 1013   AST 10 09/17/2016 1018   ALT 22 08/14/2018 1013   ALT 43 08/15/2017 1039   ALT 18 09/17/2016 1018   BILITOT 0.6 08/14/2018 1013   BILITOT 0.39 09/17/2016 1018      Impression and Plan: Ms. Dizdarevic is a very pleasant 74 yo caucasian female with history of stage IIIc melanoma if the right shoulder with resection. She had 8 positive lymph nodes and completed adjuvant interferon therapy in [place] 2012,  followed by radiation to the right axilla.   Again, I do  not see any problem with respect to melanoma.  I am just happy that this is not an issue for her.   Despite the fact that we have not seen her for a year and a half, she still looks quite good.  I am just very happy for her.  It was really fun to talk to her about her cats.  She really is a good person for taking care of all of them and giving them a good loving home.  We will plan to see her back in 1 year.      [name], MD 7/1/202111:38 AM

## 2020-05-06 DIAGNOSIS — I872 Venous insufficiency (chronic) (peripheral): Secondary | ICD-10-CM | POA: Diagnosis not present

## 2020-05-06 DIAGNOSIS — M4013 Other secondary kyphosis, cervicothoracic region: Secondary | ICD-10-CM | POA: Diagnosis not present

## 2020-05-06 DIAGNOSIS — M5416 Radiculopathy, lumbar region: Secondary | ICD-10-CM | POA: Diagnosis not present

## 2020-05-10 ENCOUNTER — Encounter: Payer: Self-pay | Admitting: Family Medicine

## 2020-05-10 ENCOUNTER — Ambulatory Visit: Payer: 59 | Admitting: Family Medicine

## 2020-05-10 DIAGNOSIS — H35362 Drusen (degenerative) of macula, left eye: Secondary | ICD-10-CM | POA: Diagnosis not present

## 2020-05-10 DIAGNOSIS — H43813 Vitreous degeneration, bilateral: Secondary | ICD-10-CM | POA: Diagnosis not present

## 2020-05-10 DIAGNOSIS — D487 Neoplasm of uncertain behavior of other specified sites: Secondary | ICD-10-CM | POA: Diagnosis not present

## 2020-05-10 DIAGNOSIS — H2511 Age-related nuclear cataract, right eye: Secondary | ICD-10-CM | POA: Diagnosis not present

## 2020-05-10 NOTE — Progress Notes (Signed)
[name], [name] ([id number]) Visit Report for 05/04/2020 HPI Details Patient Name: [name], [name]. Date of Service: 05/04/2020 12:30 PM Medical Record Number: [id number] Patient Account Number: [id number] Date of Birth/Sex: 07-07-1946 (74 y.o. [name]) Treating RN: Cornell Barman Primary Care Provider: [name] Other Clinician: Referring Provider: [name] Treating Provider/Extender: Tito Dine in Treatment: 61 History of Present Illness HPI Description: Pleasant 74 year old with history of chronic venous insufficiency. No diabetes or peripheral vascular disease. Left ABI 1.29. Questionable history of left lower extremity DVT. She developed a recurrent ulceration on her left lateral calf in December 2015, which she attributes to poor diet and subsequent lower extremity edema. She underwent endovenous laser ablation of her left greater saphenous vein in 2010. She underwent laser ablation of accessory branch of left GSV in April 2016 by Dr. Kellie Simmering at [facility]. She was previously wearing Unna boots, which she tolerated well. Tolerating 2 layer compression and cadexomer iodine. She returns to clinic for follow-up and is without new complaints. She denies any significant pain at this time. She reports persistent pain with pressure. No claudication or ischemic rest pain. No fever or chills. No drainage. READMISSION 11/13/16; this is a 74 year old woman who is not a diabetic. She is here for a review of a painful area on her left medial lower extremity. I note that she was seen here previously last year for wound I believe to be in the same area. At that time she had undergone previously a left greater saphenous vein ablation by Dr. Kellie Simmering and she had a ablation of the anterior accessory branch of the left greater saphenous vein in March 2016. Seeing that the wound actually closed over. In reviewing the history with her today the ulcer in this area has been recurrent. She describes  a biopsy of this area in 2009 that only showed stasis physiology. 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 Do not see that she has ever had arterial studies in the left leg. She tells me she has a follow-up with Dr. Kellie Simmering in roughly 10 days In any case she developed the reopening of this area roughly a month ago. On the background of this she describes rapidly increasing edema which has responded to Lasix 40 mg and metolazone 2.5 mg as well as the patient's lymph massage. She has been told she has both venous insufficiency and lymphedema but she cannot tolerate compression stockings 11/28/16; the patient saw Dr. Kellie Simmering recently. Per the patient he did arterial Dopplers in the office that did not show evidence of arterial insufficiency, per the patient he stated "treat this like an ordinary venous ulcer". She also saw her dermatologist Dr. Ronnald Ramp who felt that this was more of a vascular ulcer. In general things are improving although she arrives today with increasing bilateral lower extremity edema with weeping a deeper fluid through the wound on the left medial leg compatible with some degree of lymphedema 12/04/16; the patient's wound is fully epithelialized but I don't think fully healed. We will do another week of depression with Promogran and TCA however I suspect we'll be able to discharge her next week. This is a very unusual-looking wound which was initially a figure-of-eight type wound lying on its side surrounded by petechial like hemorrhage. She  has had venous ablation on this side. She apparently does not have an arterial issue per Dr. Kellie Simmering. She saw her dermatologist thought it was "vascular". Patient is definitely going to need ongoing compression and I talked about  this with her today she will go to elastic therapy after she leaves here next week 12/11/16; the patient's wound is not completely closed today. She has surrounding scar tissue and in further discussion with the patient it would appear that she had ulcers in this area in 2009 for a prolonged period of time ultimately requiring a punch biopsy of this area that only showed venous insufficiency. I did not previously pickup on this part of the history from the patient. 12/18/16; the patient's wound is completely epithelialized. There is no open area here. She has significant bilateral venous insufficiency with secondary lymphedema to a mild-to-moderate degree she does not have compression stockings.. She did not say anything to me when I was in the room, she told our intake nurse that she was still having pain in this area. This isn't unusual recurrent small open area. She is going to go to elastic therapy to obtain compression stockings. 12/25/16; the patient's wound is fully epithelialized. There is no open area here. The patient describes some continued episodic discomfort in this area medial left calf. However everything looks fine and healed here. She is been to elastic therapy and caught herself 15-20 mmHg stockings, they apparently were having trouble getting 20-30 mm stockings in her size 01/22/17; this is a patient we discharged from the clinic a month ago. She has a recurrent open wound on her medial left calf. She had 15 mm support stockings. I told her I thought she needed 20-30 mm compression stockings. She tells me that she has been ill with hospitalization secondary to asthma and is been found to have severe hypokalemia likely secondary to a combination of Lasix and metolazone. This morning she noted blistering and leaking fluid on the posterior part of her left leg. She called our intake nurse urgently and we was saw her this afternoon. She has not had any real discomfort here. I don't know  that she's been wearing any stockings on this leg for at least 2-3 days. ABIs in this clinic were 1.21 on the right and 1.3 on the left. She is previously seen vascular surgery who does not think that there is a peripheral arterial issue. 01/30/17; Patient arrives with no open wound on the left leg. She has been to elastic therapy and obtained 20-14mmhg below knee stockings and she has one on the right leg today. READMISSION 02/19/18; this Cirrincione is a now 74 year old patient we've had in this clinic perhaps 3 times before. I had last looked at her from January 07 December 2016 with an area on the medial left leg. We discharged her on 12/25/16 however she had to be readmitted on 01/22/17 with a recurrence. I have in my notes that we discharged her on 20-30 mm stockings although she tells me she was only wearing support hose because she cannot get stockings on predominantly related to her cervical spine surgery/issues. She has had previous ablations done by vein and vascular in [place] including a great saphenous vein ablation on the left with an anterior accessory branch ablation I think both of these were in 2016. On one of the previous visit she had a biopsy noted 2009 that was negative. She is not felt to have an arterial issue. She is not a diabetic. She does  have a history of obstructive sleep apnea hypertension asthma as well as chronic venous insufficiency and lymphedema. On this occasion she noted 2 dry scaly patch on her left leg. She tried to put lotion on this it didn't really help. There were 2 open areas.the [name], [name] ([id number]) patient has been seeing her primary physician from 02/05/18 through 02/14/18. She had Unna boots applied. The superior wound now on the lateral left leg has closed but she's had one wound that remains open on the lateral left leg. This is not the same spot as we dealt with in 2018. ABIs in this clinic were 1.3 bilaterally 02/26/18; patient has a small wound on  the left lateral calf. Dimensions are down. She has chronic venous insufficiency and lymphedema. 03/05/18; small open area on the left lateral calf. Dimensions are down. Tightly adherent necrotic debris over the surface of the wound which was difficult to remove. Also the dressing [over collagen] stuck to the wound surface. This was removed with some difficulty as well. Change the primary dressing to Hydrofera Blue ready 03/12/18; small open area on the left lateral calf. Comes in with tightly adherent surface eschar as well as some adherent Hydrofera Blue. 03/19/18; open area on the left lateral calf. Again adherent surface eschar as well as some adherent Hydrofera Blue nonviable subcutaneous tissue. She complained of pain all week even with the reduction from 4-3 layer compression I put on last week. Also she had an increase in her ankle and calf measurements probably related to the same thing. 03/26/18; open area on the left lateral calf. A very small open area remains here. We used silver alginate starting last week as the Hydrofera Blue seem to stick to the wound bed. In using 4-layer compression 04/02/18; the open area in the left lateral calf at some adherent slough which I removed there is no open area here. We are able to transition her into her own compression stocking. Truthfully I think this is probably his support hose. However this does not maintain skin integrity will be limited. She cannot put over the toe compression stockings on because of neck problems hand problems etc. She is allergic to the lining layer of juxta lites. We might be forced to use extremitease stocking should this fail READMIT 11/24/2018 Patient is now a 74 year old woman who is not a diabetic. She has been in this clinic on at least 3 previous occasions largely with recurrent wounds on her left leg secondary to chronic venous insufficiency with secondary lymphedema. Her situation is complicated by inability to get  stockings on and an allergy to neoprene which is apparently a component and at least juxta lites and other stockings. As a result she really has not been wearing any stockings on her legs. She tells [place] that roughly 2 or 3 weeks ago she started noticing a stinging sensation just above her ankle on the left medial aspect. She has been diagnosed with pseudogout and she wondered whether this was what she was experiencing. She tried to dress this with something she bought at the store however subsequently it pulled skin off and now she has an open wound that is not improving. She has been using Vaseline gauze with a cover bandage. She saw her primary doctor last week who put an Haematologist on her. ABIs in this clinic was 1.03 on the left 2/12; the area is on the left medial ankle. Odd-looking wound with what looks to be surface epithelialization but a multitude of  small petechial openings. This clearly not closed yet. We have been using silver alginate under 3 layer compression with TCA 2/19; the wound area did not look quite as good this week. Necrotic debris over the majority of the wound surface which required debridement. She continues to have a multitude of what looked to be small petechial openings. She reminds [place] that she had a biopsy on this initially during her first outbreak in 2015 in [place] [facility]. She expresses concern about this being a possible melanoma. 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 I assured her that this does not look anything like melanoma. Except for the petechial reaction it does look like a venous insufficiency area and she certainly has evidence of this on both sides 2/26; a difficult area on the left medial ankle. The patient clearly has chronic venous hypertension with some degree of lymphedema. The odd thing about the area is the small petechial hemorrhages. I am not  really sure how to explain this. This was present last time and this is not a compression injury. We have been using Hydrofera Blue which I changed to last week 3/4; still using Hydrofera Blue. Aggressive debridement today. She does not have known arterial issues. She has seen Dr. Kellie Simmering at [facility] vein [facility] vascular and and has an ablation on the left. [Anterior accessory branch of the greater saphenous]. From what I remember they did not feel she had an arterial issue. The patient has had this area biopsied in 2009 at [facility] dermatology and by her recollection they said this was "stasis". She is also follow-up with dermatology locally who thought that this was more of a vascular issue 3/11; using Hydrofera Blue. Aggressive debridement today. She does not have an arterial issue. We are using 3 layer compression although we may need to go to 4. The patient has been in for multiple changes to her wrap since I last saw her a week ago. She says that the area was leaking. I do not have too much more information on what was found 01/19/19 on evaluation today patient was actually being seen for a nurse visit when unfortunately she had the area on her left lateral lower extremity as well as weeping from the right lower extremity that became apparent. Therefore we did end up actually seeing her for a full visit with myself. She is having some pain at this site as well but fortunately nothing too significant at this point. No fevers, chills, nausea, or vomiting noted at this time. 3/18-Patient is back to the clinic with the left leg venous leg ulcer, the ulcer is larger in size, has a surface that is densely adherent with fibrinous tissue, the Hydrofera Blue was used but is densely adherent and there was difficulty in removing it. The right lower extremity was also wrapped for weeping edema. Patient has a new area over the left lateral foot above the malleolus that is small and appears to have no debris  with intact surrounding skin. Patient is on increased dose of Lasix also as a means to edema management 3/25; the patient has a nonhealing venous ulcer on the medial left leg and last week developed a smaller area on the lateral left calf. We have been using Hydrofera Blue with a contact layer. 4/1; no major change in these wounds areas. Left medial and more recently left lateral calf. I tried Iodoflex last week to aid in debridement  she did not tolerate this. She stated her pain was terrible all week. She took the top layer of the 4 layer compression off. 4/8; the patient actually looks somewhat better in terms of her more prominent left lateral calf wound. There is some healthy looking tissue here. She is still complaining of a lot of discomfort. 4/15; patient in a lot of pain secondary to sciatica. She is on a prednisone taper prescribed by her primary physician. She has the 2 areas one on the left medial and more recently a smaller area on the left lateral calf. Both of these just above the malleoli 4/22; her back pain is better but she still states she is very uncomfortable and now feels she is intolerant to the [employer]. No real change in the wounds we have been using Sorbact. She has been previously intolerant to Iodoflex. There is not a lot of option about what we can use to debride this wound under compression that she no doubt needs. sHe states Ultram no longer works for her pain 4/29; no major change in the wounds slightly increased depth. Surface on the original medial wound perhaps somewhat improved however the more recent area on the lateral left ankle is 100% covered in very adherent debris we have been using Sorbact. She tolerates 4 layer compression well and her edema control is a lot better. She has not had to come in for a nurse check 5/6; no major change in the condition of the wounds. She did consent to debridement today which was done with some difficulty. Continuing Sorbact.  She did not tolerate Iodoflex. She was in for a check of her compression the day after we wrapped her last week this was adjusted but nothing much was found 5/13; no major change in the condition or area of the wounds. I was able to get a fairly aggressive debridement done on the lateral left leg wound. Even using Sorbact under compression. She came back in on Friday to have the wrap changed. She says she felt uncomfortable on the [name], [name]. ([id number]) lateral aspect of her ankle. She has a long history of chronic venous insufficiency including previous ablation surgery on this side. 5/20-Patient returns for wounds on left leg with both wounds covered in slough, with the lateral leg wound larger in size, she has been in 3 layer compression and felt more comfortable, she describes pain in ankle, in leg and pins and needles in foot, and is about to try Pamelor for this 6/3; wounds on the left lateral and left medial leg. The area medially which is the most recent of the 2 seems to have had the largest increase in dimensions. We have been using Sorbac to try and debride the surface. She has been to see orthopedics they apparently did a plain x-ray that was indeterminant. Diagnosed her with neuropathy and they have ordered an MRI to determine if there is underlying osteomyelitis. This was not high on my thought list but I suppose it is prudent. We have advised her to make an appointment with vein and vascular in [place]. She has a history of a left greater saphenous and accessory vein ablations I wonder if there is anything else that can be done from a surgical point of view to help in these difficult refractory wounds. We have previously healed this wound on one occasion but it keeps on reopening [medial side] 6/10; deep tissue culture I did last week I think on the left medial wound showed  both moderate E. coli and moderate staph aureus [MSSA]. She is going to require antibiotics and I have  chosen Augmentin. We have been using Sorbact and we have made better looking wound surface on both sides but certainly no improvement in wound area. She was back in last Friday apparently for a dressing changes the wrap was hurting her outer left ankle. She has not managed to get a hold of vein and vascular in [place]. We are going to have to make her that appointment 6/17; patient is tolerating the Augmentin. She had an MRI that I think was ordered by orthopedic surgeon this did not show osteomyelitis or an abscess did suggest cellulitis. We have been using Sorbact to the lateral and medial ankles. We have been trying to arrange a follow-up appointment with vein and vascular in [place] or did her original ablations. We apparently an area sent the request to vein and vascular in [facility] 6/24; patient has completed the Augmentin. We do not yet have a vein and vascular appointment in [place]. I am not sure what the issue is here we have asked her to call tomorrow. We are using Sorbact. Making some improvements and especially the medial wound. Both surfaces however look better medial and lateral. 7/1; the patient has been in contact with vein and vascular in [place] but has not yet received an appointment. Using Sorbact we have gradually improve the wound surface with no improvement in surface area. She is approved for Apligraf but the wound surface still is not completely viable. She has not had to come in for a dressing change 7/8; the patient has an appointment with vein and vascular on 7/31 which is a Friday afternoon. She is concerned about getting back here for [place] to dress her wounds. I think it is important to have them goal for her venous reflux/history of ablations etc. to see if anything else can be done. She apparently tested positive for 1 of the blood tests with regards to lupus and saw a rheumatologist. He has raised the issue of vasculitis again. I have had this thought in  the past however the evidence seems overwhelming that this is a venous reflux etiology. If the rheumatologist tells me there is clinical and laboratory investigation is positive for lupus I will rethink this. 7/15; the patient's wound surfaces are quite a bit better. The medial area which was her original wound now has no depth although the lateral wound which was the more recent area actually appears larger. Both with viable surfaces which is indeed better. Using Sorbact. I wanted to use Apligraf on her however there is the issue of the vein and vascular appointment on 7/31 at 2:00 in the afternoon which would not allow her to get back to be rewrapped and they would no doubt remove the graft 7/22; the patient's wound surfaces have moderate amount of debris although generally look better. The lateral one is larger with 2 small satellite areas superiorly. We are waiting for her vein and vascular appointment on 7/31. She has been approved for Apligraf which I would like to use after th 7/29; wound surfaces have improved no debridement is required we have been using Sorbact. She sees vein and vascular on Friday with this so question of whether anything can be done to lessen the likelihood of recurrence and/or speed the healing of these areas. She is already had previous ablations. She no doubt has severe venous hypertension 8/5-Patient returns at 1 week, she was in [employer]  for 3 days by her podiatrist, we have been using so backed to the wound, she has increased pain in both the wounds on the left lower leg especially the more distal one on the lateral aspect 8/12-Patient returns at 1 week and she is agreeable to having debridement in both wounds on her left leg today. We have been using Sorbact, and vascular studies were reviewed at last visit 8/19; the patient arrives with her wounds fairly clean and no debridement is required. We have used Sorbact which is really done a nice job in cleaning up  these very difficult wound surfaces. The patient saw Dr. Donzetta Matters of vascular surgery on 7/31. He did not feel that there was an arterial component. He felt that her treated greater saphenous vein is adequately addressed and that the small saphenous vein did not appear to be involved significantly. She was also noted to have deep venous reflux which is not treatable. Dr. Donzetta Matters mentioned the possibility of a central obstructive component leading to reflux and he offered her central venography. She wanted to discuss this or think about it. I have urged her to go ahead with this. She has had recurrent difficult wounds in these areas which do heal but after months in the clinic. If there is anything that can be done to reduce the likelihood of this I think it is worth it. 9/2 she is still working towards getting follow-up with Dr. Donzetta Matters to schedule her CT. Things are quite a bit worse venography. I put Apligraf on 2 weeks ago on both wounds on the medial and lateral part of her left lower leg. She arrives in clinic today with 3 superficial additional wounds above the area laterally and one below the wound medially. She describes a lot of discomfort. I think these are probably wrapped injuries. Does not look like she has cellulitis. 07/20/2019 on evaluation today patient appears to be doing somewhat poorly in regard to her lower extremity ulcers. She in fact showed signs of erythema in fact we may even be dealing with an infection at this time. Unfortunately I am unsure if this is just infection or if indeed there may be some allergic reaction that occurred as a result of the Apligraf application. With that being said that would be unusual but nonetheless not impossible in this patient is one who is unfortunately allergic to quite a bit. Currently we have been using the Sorbact which seems to do as well as anything for her. I do think we may want to obtain a culture today to see if there is anything showing up  there that may need to be addressed. 9/16; noted that last week the wounds look worse in 1 week follow-up of the Apligraf. Using Sorbact as of 2 days ago. She arrives with copious amounts of drainage and new skin breakdown on the back of the left calf. The wounds arm more substantial bilaterally. There is a fair amount of swelling in the left calf no overt DVT there is edema present I think in the left greater than right thigh. She is supposed to go on 9/28 for CT venography. The wounds on the medial and lateral calf are worse and she has new skin breakdown posteriorly at least new for me. This is almost developing into a circumferential wound area The Apligraf was taken off last week which I agree with things are not going in the right direction a culture was done we do not have that back yet. She is  on Augmentin that she started 2 days ago 9/23; dressing was changed by her nurses on Monday. In general there is no improvement in the wound areas although the area looks less angry than last week. She did get Augmentin for MSSA cultured on the 14th. She still appears to have too much swelling in the left leg even with 3 layer compression 9/30; the patient underwent her procedure on 9/28 by Dr. Donzetta Matters at [facility]. She was discovered to have the common iliac vein measuring 12.2 mm but at the level of L4-L5 measured 3 mm. After stenting it measured 10 mm. 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 Rouleaux flow in the common femoral and femoral vein was observed much improved after stenting. We are using silver alginate to the wounds on the medial and lateral ankle on the left. 4 layer compression 10/7; the patient had fluid swelling around her knee and 4 layer compression. At the advice of vein and vascular this was reduced to 3 layer which she is tolerating better. We have been using silver alginate under 3 layer compression since last Friday 10/14; arrives with the  areas on the left ankle looking a lot better. Inflammation in the area also a lot better. She came in for a nurse check on [name], [name] ([id number]) 10/9 10/21; continued nice improvement. Slight improvements in surface area of both the medial and lateral wounds on the left. A lot of the satellite lesions in the weeping erythema around these from stasis dermatitis is resolved. We have been using silver alginate 10/28; general improvement in the entire wound areas although not a lot of change in dimensions the wound certainly looks better. There is a lot less in terms of venous inflammation. Continue silver alginate this week however look towards Hydrofera Blue next week 11/4; very adherent debris on the medial wound left wound is not as bad. We have been using silver alginate. Change to [facility] today 11/11; very adherent debris on both wound areas. She went to vein and vascular last week and follow-up they put in [place] boot on this today. He says the [facility] was adherent. Wound is definitely not as good as last week. Especially on the left there the satellite lesions look more prominent 11/18; absolutely no better. erythema on lateral aspect with tenderness. 09/30/2019 on evaluation today patient appears to actually be doing better. Dr. Dellia Nims did put her on doxycycline last week which I do believe has helped her at this point. Fortunately there is no signs of active infection at this time. No fevers, chills, nausea, vomiting, or diarrhea. I do believe he may want extend the doxycycline for 7 additional days just to ensure everything does completely cleared up the patient is in agreement with that plan. Otherwise she is going require some sharp debridement today 12/2; patient is completing a 2-week course of doxycycline. I gave her this empirically for inflammation as well as infection when I last saw her 2 weeks ago. All of this seems to be better. She is using silver alginate she  has the area on the medial aspect of the larger area laterally and the 2 small satellite regions laterally above the major wound. 12/9; the patient's wound on the left medial and left lateral calf look really quite good. We have been using silver alginate. She saw vein and vascular in follow-up on 10/09/2019. She has had a previous left greater saphenous vein ablation by Dr. Oscar La in 2016.  More recently she underwent a left common iliac vein stent by Dr. 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 The swelling is improved and certainly the wounds have improved. The patient shows [place] today area on the right medial calf there is almost no wound but leaking lymphedema. She says she start this started 3 or 4 days ago. She did not traumatize it. It is not painful. She does not wear compression on that side 12/16; the patient continues to do well laterally. Medially still requiring debridement. The area on the right calf did not materialize to anything and is not currently open. We wrapped this last time. She has support stockings for that leg although I am not sure they are going to provide adequate compression 12/23; the lateral wound looks stable. Medially still requiring debridement for tightly adherent fibrinous debris. We've been using silver alginate. Surface area not any different 12/30; neither wound is any better with regards to surface and the area on the left lateral is larger. I been using silver alginate to the left lateral which look quite good last week and Sorbact to the left medial 11/11/2019. Lateral wound area actually looks better and somewhat smaller. Medial still requires a very aggressive debridement today. We have been using Sorbact on both wound areas 1/13; not much better still adherent debris bilaterally. I been using Sorbact. She has severe venous hypertension. Probably some degree of dermal fibrosis distally. I wonder whether tighter compression might help and I am going  to try that today. We also need to work on the bioburden 1/20; using Sorbact. She has severe venous hypertension status post stent placement for pelvic vein compression. We applied gentamicin last time to see if we could reduce bioburden I had some discussion with her today about the use of pentoxifylline. This is occasionally used in this setting for wounds with refractory venous insufficiency. However this interacts with Plavix. She tells me that she was put on this after stent placement for 3 months. She will call Dr. Claretha Cooper office to discuss 1/27; we are using gentamicin under Sorbact. 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 She has a stent. Wound medially is measuring smaller this week. Laterally measuring slightly larger although she has some satellite lesions superiorly 2/3; gentamicin under Sorbact under 4-layer compression. 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 She has a stent on Plavix. Her wounds are measuring smaller this week. More substantially laterally where there is a satellite lesion superiorly. 2/10; gentamicin under Sorbac. 4-layer compression. Patient communicated with Dr. Donzetta Matters at vein and vascular in [place]. He is okay with the patient coming off Plavix I will therefore start her on pentoxifylline for a 1 month trial. In general her wounds look better today. I had some concerns about swelling in the left thigh however she measures 61.5 on the right and 63 on the mid thigh which does not suggest there is any difficulty. The patient is not describing any pain. 2/17; gentamicin under Sorbac 4-layer compression. She has been on pentoxifylline for 1 week and complains of loose stool. No nausea she is eating and drinking well 2/24; the patient apparently came in 2 days ago for a nurse visit when her wrap fell down. Both areas look a little worse this week macerated medially and satellite lesions laterally. Change to  silver alginate today 3/3; wounds are larger today especially medially. She also has more swelling in her foot lower leg and I even noted some swelling in  her posterior thigh which is tender. I wonder about the patency of her stent. Fortuitously she sees Dr. Claretha Cooper group on Friday 3/10; Mrs. Micale was seen by vein and vascular on 3/5. The patient underwent ultrasound. There was no evidence of thrombosis involving the IVC no evidence of thrombosis involving the right common iliac vein there is no evidence of thrombosis involving the right external iliac vein the left external vein is also patent. The right common iliac vein stent appears patent bilateral common femoral veins are compressible and appear patent. I was concerned about the left common iliac stent however it looks like this is functional. She has some edema in the posterior thigh that was tender she still has that this week. I also note they had trouble finding the pulses in her left foot and booked her for an ABI baseline in 4 weeks. She will follow up in 6 months for repeat IVC duplex. The patient stopped the pentoxifylline because of diarrhea. It does not look like that was being effective in any case. I have advised her to go back on her aspirin 81 mg tablet, vascular it also suggested this 3/17; comes in today with her wound surfaces a lot better. The excoriations from last week considerably better probably secondary to the TCA. We have been using silver alginate 3/24; comes in today with smaller wounds both medially and laterally. Both required debridement. There are 2 small satellite areas superiorly laterally. She also has a very odd bandlike area in the mid calf almost looking like there was a weakness in the wrap in a localized area. I would write this off as being this however anteriorly she has a small raised ballotable area that is very tender almost reminiscent of an abscess but there was no obvious purulent surface to it.  02/04/20 upon evaluation today patient appears to be doing fairly well in regard to her wounds today. Fortunately there is no signs of active BRENNLEY, CURTICE. ([id number]) infection at this time. No fevers, chills, nausea, vomiting, or diarrhea. She has been tolerating the dressing changes without complication. Fortunately I feel like she is showing signs of improvement although has been sometime since have seen her. Nonetheless the area of concern that Dr. Dellia Nims had last week where she had possibly an area of the wrap that was we can allow the leg to bulge appears to be doing significantly better today there is no signs of anything worsening. 4/7; the patient's wounds on her medial and lateral left leg continue to contract. We have been using a regular alginate. Last week she developed an area on the right medial lower leg which is probably a venous ulcer as well. 4/14; the wounds on her left medial and lateral lower leg continue to contract. Surface eschar. We have been using regular alginate. The area on the right medial lower leg is closed. We have been putting both legs under 4-layer contraction. The patient went back to see vein and vascular she had arterial studies done which were apparently "quite good" per the patient although I have not read their notes I have never felt she had an arterial issue. 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 This is been longstanding and refractory to exercise, leg elevation and longstanding use of compression wraps in our clinic as well as compression stockings on the times we have been able to get these to heal 4/21; we thought she actually might be close this week however she arrives in  clinic with a lot of edema in her upper left calf and into her posterior thigh. This is been an intermittent problem here. She says the wrap fell down but it was replaced with a nurse visit on Monday. We are using calcium  alginate to the wounds and the wound sizes there not terribly larger than last week but there is a lot more edema 4/28; again wound edges are smaller on both sides. Her edema is better controlled than last time. She is obtained her compression pumps from [employer] although they have not been to her home to set these up. 5/5; left medial and left lateral both look stable. I am not sure the medial is any smaller. We have been using calcium alginate under 4-layer compression. oShe had an area on the right medial. This was eschared today. We have been wrapping this as well. She does not tolerate external compression stockings due to a history of various contact allergies. She has her compression pumps however the representative from the company is coming on her to show her how to use these tomorrow 5/19; patient with severe chronic venous insufficiency secondary to central venous disease. She had a stent placed in her left common iliac vein. She has done better since but still difficult to control wounds. She comes in today with nothing open on the right leg. Her areas on the left medial and left lateral are just about closed. We are using calcium alginate under 4-layer compression. She is using her external compression pumps at home She only has 15-20 support stockings. States she cannot get anything tighter than that on. 03/30/20-Patient returns at 1 week, the wounds on the left leg are both slightly bigger, the last week she was on 3 layer compression which started to slide down. She is starting to use her lymphedema pumps although she stated on 1 day her right ankle started to swell up and she have to stop that day. Unfortunately the open area seem to oscillate between improving to the point of healing and then flaring up all to do with effectiveness of compression or lack of due to the left leg topography not keeping the compression wraps from rolling down 6/2 patient comes in with a 15/20  mmHg stocking on the right leg. She tells me that she developed a lot of swelling in her ankles she saw orthopedics she was felt to possibly be having a flare of pseudogout versus some other type of arthritis. She was put on steroids for a respiratory issue so that helps with the inflammation. She has not been using the pumps all week. She thinks the left thigh is more swollen than usual and I would agree with that. She has an appointment with Dr. Donzetta Matters 9 days or so from now 6/9; both wounds on the left medial and left lateral are smaller. We have been using calcium alginate under compression. She does not have an open wound on the right leg she is using a stocking and her compression pumps things are going well. She has an appointment with Dr. Donzetta Matters with regards to her stent in the left common iliac vein 6/16; the wounds on the left medial and left lateral ankle continues to contract. The patient saw Dr. Donzetta Matters and I think he seems satisfied. Ordered follow-up venous reflux studies on both sides in September. Cautioned that she may need thigh-high stockings. She has been using calcium alginate under compression on the left and her own stocking on the right leg.  She tells [place] there are no open wounds on the right 6/23; left lateral is just about closed. Medial required debridement today. We have been using calcium alginate. Extensive discussion about the compression pumps she is only using these on 25 mmHg states she could not take 40 or 30 when the wrap came out to her home to demonstrate these. He said they should not feel tight 6/30; the left lateral wound has a slight amount of eschar. . The area medially is about the same using Hydrofera Blue. Electronic Signature(s) Signed: 05/10/2020 7:31:01 AM By: Linton Ham MD Entered By: Linton Ham on 05/04/2020 12:57:39 Wetherbee, [name] ([id number]) -------------------------------------------------------------------------------- Physical Exam Details  Patient Name: SUEANN, BROWNLEY. Date of Service: 05/04/2020 12:30 PM Medical Record Number: [id number] Patient Account Number: [id number] Date of Birth/Sex: 1946/10/01 (74 y.o. [name]) Treating RN: Cornell Barman Primary Care Provider: [name] Other Clinician: Referring Provider: [name] Treating Provider/Extender: Tito Dine in Treatment: 78 Constitutional Patient is hypertensive.. Pulse regular and within target range for patient.Marland Kitchen Respirations regular, non-labored and within target range.. Temperature is normal and within the target range for the patient.Marland Kitchen appears in no distress. Cardiovascular Pedal pulses are palpable. Edema control from the mid tibial area towards the knee is marginal. Significant changes of chronic venous insufficiency. Integumentary (Hair, Skin) Dermatosclerosis. Notes Wound exam; medial side did not require debridement. Surface looks healthy. oLaterally there is some eschar present however in the middle of this there is normal epithelialization I did not think this requires debridement. Electronic Signature(s) Signed: 05/10/2020 7:31:01 AM By: Linton Ham MD Entered By: Linton Ham on 05/04/2020 12:59:51 [name], [name] ([id number]) -------------------------------------------------------------------------------- Physician Orders Details Patient Name: STARLING, JESSIE. Date of Service: 05/04/2020 12:30 PM Medical Record Number: [id number] Patient Account Number: [id number] Date of Birth/Sex: 05-06-1946 (75 y.o. [name]) Treating RN: Cornell Barman Primary Care Provider: [name] Other Clinician: Referring Provider: [name] Treating Provider/Extender: Tito Dine in Treatment: 66 Verbal / Phone Orders: No Diagnosis Coding Anesthetic (add to Medication List) Wound #5 Left,Medial Lower Leg o Topical Lidocaine 4% cream applied to wound bed prior to debridement (In Clinic Only). Wound #6 Left,Lateral Lower Leg  o Topical Lidocaine 4% cream applied to wound bed prior to debridement (In Clinic Only). Primary Wound Dressing Wound #5 Left,Medial Lower Leg o Hydrafera Blue Ready Transfer Wound #6 Left,Lateral Lower Leg o Hydrafera Blue Ready Transfer Secondary Dressing Wound #5 Left,Medial Lower Leg o ABD pad Wound #6 Left,Lateral Lower Leg o ABD pad Dressing Change Frequency Wound #5 Left,Medial Lower Leg o Change dressing every week o Other: - Nurse visit Friday and Monday if needed. Wound #6 Left,Lateral Lower Leg o Change dressing every week o Other: - Nurse visit Friday and Monday if needed. Follow-up Appointments Wound #5 Left,Medial Lower Leg o Return Appointment in 1 week. o Nurse Visit as needed Wound #6 Left,Lateral Lower Leg o Return Appointment in 1 week. o Nurse Visit as needed Edema Control Wound #5 Left,Medial Lower Leg o 4-Layer Compression System - Left Lower Extremity. o Patient to wear own compression stockings - On Right o Compression Pump: Use compression pump on left lower extremity for 60 minutes, twice daily. o Compression Pump: Use compression pump on right lower extremity for 60 minutes, twice daily. Wound #6 Left,Lateral Lower Leg o 4-Layer Compression System - Left Lower Extremity. o Patient to wear own compression stockings - On Right o Compression Pump: Use compression pump on left lower extremity for 60 minutes, twice  daily. o Compression Pump: Use compression pump on right lower extremity for 60 minutes, twice daily. Electronic Signature(s) Signed: 05/04/2020 2:35:46 PM By: [name], [name], RN, CWS, [name], [name] [address] [name] ([id number]) Signed: 05/10/2020 7:31:01 AM By: Linton Ham MD Entered By: [name], [name], RN, CWS, Kim on 05/04/2020 12:52:37 [name], [name] ([id number]) -------------------------------------------------------------------------------- Problem List Details Patient Name: [name], [name]. Date of  Service: 05/04/2020 12:30 PM Medical Record Number: [id number] Patient Account Number: [id number] Date of Birth/Sex: 04/28/1946 (74 y.o. [name]) Treating RN: Cornell Barman Primary Care Provider: [name] Other Clinician: Referring Provider: [name] Treating Provider/Extender: Tito Dine in Treatment: 21 Active Problems ICD-10 Encounter Code Description Active Date MDM Diagnosis L97.221 Non-pressure chronic ulcer of left calf limited to breakdown of skin 01/07/2019 No Yes I89.0 Lymphedema, not elsewhere classified 12/10/2018 No Yes I87.332 Chronic venous hypertension (idiopathic) with ulcer and inflammation of 12/09/2019 No Yes left lower extremity I83.009 Varicose veins of unspecified lower extremity with ulcer of unspecified 04/13/2020 No Yes site I82.592 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 12/09/2019 No Yes lower extremity Inactive Problems ICD-10 Code Description Active Date Inactive Date L97.818 Non-pressure chronic ulcer of other part of right lower leg with other specified 10/14/2019 10/14/2019 severity Resolved Problems ICD-10 Code Description Active Date Resolved Date L97.211 Non-pressure chronic ulcer of right calf limited to breakdown of skin 02/10/2020 02/10/2020 Electronic Signature(s) Signed: 05/10/2020 7:31:01 AM By: Linton Ham MD Entered By: Linton Ham on 05/04/2020 12:56:43 Segundo, [name] ([id number]) -------------------------------------------------------------------------------- Progress Note Details Patient Name: ANGELLI, BARUCH. Date of Service: 05/04/2020 12:30 PM Medical Record Number: [id number] Patient Account Number: [id number] Date of Birth/Sex: [name] 06, 1946 (74 y.o. [name]) Treating RN: Cornell Barman Primary Care Provider: [name] Other Clinician: Referring Provider: [name] Treating Provider/Extender: Tito Dine in Treatment: 48 Subjective History of Present Illness (HPI)  Pleasant 74 year old with history of chronic venous insufficiency. No diabetes or peripheral vascular disease. Left ABI 1.29. Questionable history of left lower extremity DVT. She developed a recurrent ulceration on her left lateral calf in December 2015, which she attributes to poor diet and subsequent lower extremity edema. She underwent endovenous laser ablation of her left greater saphenous vein in 2010. She underwent laser ablation of accessory branch of left GSV in April 2016 by Dr. Kellie Simmering at [facility]. She was previously wearing [place] boots, which she tolerated well. Tolerating 2 layer compression and cadexomer iodine. She returns to clinic for follow-up and is without new complaints. She denies any significant pain at this time. She reports persistent pain with pressure. No claudication or ischemic rest pain. No fever or chills. No drainage. READMISSION 11/13/16; this is a 74 year old woman who is not a diabetic. She is here for a review of a painful area on her left medial lower extremity. I note that she was seen here previously last year for wound I believe to be in the same area. At that time she had undergone previously a left greater saphenous vein ablation by Dr. Kellie Simmering and she had a ablation of the anterior accessory branch of the left greater saphenous vein in March 2016. Seeing that the wound actually closed over. In reviewing the history with her today the ulcer in this area has been recurrent. She describes a biopsy of this area in 2009 that only showed stasis physiology. 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 Do not see that she has ever had arterial studies in the left leg. She tells me she has a follow-up with Dr. Kellie Simmering in roughly 10 days In any case she  developed the reopening of this area roughly a month ago. On the background of this she describes rapidly increasing edema which has responded to Lasix 40 mg and metolazone 2.5 mg as well as the patient's lymph massage. She has been told she has both venous insufficiency and lymphedema but she cannot tolerate compression stockings 11/28/16; the patient saw Dr. Kellie Simmering recently. Per the patient he did arterial Dopplers in the office that did not show evidence of arterial insufficiency, per the patient he stated "treat this like an ordinary venous ulcer". She also saw her dermatologist Dr. Ronnald Ramp who felt that this was more of a vascular ulcer. In general things are improving although she arrives today with increasing bilateral lower extremity edema with weeping a deeper fluid through the wound on the left medial leg compatible with some degree of lymphedema 12/04/16; the patient's wound is fully epithelialized but I don't think fully healed. We will do another week of depression with Promogran and TCA however I suspect we'll be able to discharge her next week. This is a very unusual-looking wound which was initially a figure-of-eight type wound lying on its side surrounded by petechial like hemorrhage. She has had venous ablation on this side. She apparently does not have an arterial issue per Dr. Kellie Simmering. She saw her dermatologist thought it was "vascular". Patient is definitely going to need ongoing compression and I talked about this with her today she will go to elastic therapy after she leaves here next week 12/11/16; the patient's wound is not completely closed today. She has surrounding scar tissue and in further discussion with the patient it would appear that she had ulcers in this area in 2009 for a prolonged period of time ultimately requiring a punch biopsy of this area that only showed venous insufficiency. I did not previously pickup on this part of the history from the patient. 12/18/16; the  patient's wound is completely epithelialized. There is no open area here. She has significant bilateral venous insufficiency with secondary lymphedema to a mild-to-moderate degree she does not have compression stockings.. She did not say anything to me when I was in the room, she told our intake nurse that she was still having pain in this area. This isn't unusual recurrent small open area. She is going to go to elastic therapy to obtain compression stockings. 12/25/16; the patient's wound is fully epithelialized. There is no open area here. The patient describes some continued episodic discomfort in this area medial left calf. However everything looks fine and healed here. She is been to elastic therapy and caught herself 15-20 mmHg stockings, they apparently were having trouble getting 20-30 mm stockings in her size 01/22/17; this is a patient we discharged from the clinic a month ago. She has a recurrent open wound on her medial left calf. She had 15 mm support stockings. I told her I thought she needed 20-30 mm compression stockings. She tells me that she has been ill with hospitalization secondary to asthma and is been found to have severe hypokalemia likely secondary to a combination of Lasix and metolazone. This morning she noted blistering and leaking fluid on the posterior part of her left leg.  She called our intake nurse urgently and we was saw her this afternoon. She has not had any real discomfort here. I don't know that she's been wearing any stockings on this leg for at least 2-3 days. ABIs in this clinic were 1.21 on the right and 1.3 on the left. She is previously seen vascular surgery who does not think that there is a peripheral arterial issue. 01/30/17; Patient arrives with no open wound on the left leg. She has been to elastic therapy and obtained 20-6mmhg below knee stockings and she has one on the right leg today. READMISSION 02/19/18; this Fike is a now 74 year old patient we've  had in this clinic perhaps 3 times before. I had last looked at her from January 07 December 2016 with an area on the medial left leg. We discharged her on 12/25/16 however she had to be readmitted on 01/22/17 with a recurrence. I have in my notes that we discharged her on 20-30 mm stockings although she tells me she was only wearing support hose because she cannot get stockings on predominantly related to her cervical spine surgery/issues. She has had previous ablations done by vein and vascular in [place] including a great saphenous vein ablation on the left with an anterior accessory branch ablation I think both of these were in 2016. On one of the previous visit she had a biopsy noted 2009 that was negative. She is not felt to have an arterial issue. She is not a diabetic. She does have a history of obstructive sleep apnea hypertension asthma as well as chronic venous insufficiency and lymphedema. On this occasion she noted 2 dry scaly patch on her left leg. She tried to put lotion on this it didn't really help. There were 2 open areas.the patient has been seeing her primary physician from 02/05/18 through 02/14/18. She had Unna boots applied. The superior wound now on the lateral left leg has closed but she's had one wound that remains open on the lateral left leg. This is not the same spot as we dealt with in 2018. ABIs in this clinic were 1.3 bilaterally [name], [name] ([id number]) 02/26/18; patient has a small wound on the left lateral calf. Dimensions are down. She has chronic venous insufficiency and lymphedema. 03/05/18; small open area on the left lateral calf. Dimensions are down. Tightly adherent necrotic debris over the surface of the wound which was difficult to remove. Also the dressing [over collagen] stuck to the wound surface. This was removed with some difficulty as well. Change the primary dressing to Hydrofera Blue ready 03/12/18; small open area on the left lateral calf. Comes in  with tightly adherent surface eschar as well as some adherent Hydrofera Blue. 03/19/18; open area on the left lateral calf. Again adherent surface eschar as well as some adherent Hydrofera Blue nonviable subcutaneous tissue. She complained of pain all week even with the reduction from 4-3 layer compression I put on last week. Also she had an increase in her ankle and calf measurements probably related to the same thing. 03/26/18; open area on the left lateral calf. A very small open area remains here. We used silver alginate starting last week as the Hydrofera Blue seem to stick to the wound bed. In using 4-layer compression 04/02/18; the open area in the left lateral calf at some adherent slough which I removed there is no open area here. We are able to transition her into her own compression stocking. Truthfully I think this is probably his support  hose. However this does not maintain skin integrity will be limited. She cannot put over the toe compression stockings on because of neck problems hand problems etc. She is allergic to the lining layer of juxta lites. We might be forced to use extremitease stocking should this fail READMIT 11/24/2018 Patient is now a 74 year old woman who is not a diabetic. She has been in this clinic on at least 3 previous occasions largely with recurrent wounds on her left leg secondary to chronic venous insufficiency with secondary lymphedema. Her situation is complicated by inability to get stockings on and an allergy to neoprene which is apparently a component and at least juxta lites and other stockings. As a result she really has not been wearing any stockings on her legs. She tells [place] that roughly 2 or 3 weeks ago she started noticing a stinging sensation just above her ankle on the left medial aspect. She has been diagnosed with pseudogout and she wondered whether this was what she was experiencing. She tried to dress this with something she bought at the store  however subsequently it pulled skin off and now she has an open wound that is not improving. She has been using Vaseline gauze with a cover bandage. She saw her primary doctor last week who put an Haematologist on her. ABIs in this clinic was 1.03 on the left 2/12; the area is on the left medial ankle. Odd-looking wound with what looks to be surface epithelialization but a multitude of small petechial openings. This clearly not closed yet. We have been using silver alginate under 3 layer compression with TCA 2/19; the wound area did not look quite as good this week. Necrotic debris over the majority of the wound surface which required debridement. She continues to have a multitude of what looked to be small petechial openings. She reminds [place] that she had a biopsy on this initially during her first outbreak in 2015 in [place]. She expresses concern about this being a possible melanoma. 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 I assured her that this does not look anything like melanoma. Except for the petechial reaction it does look like a venous insufficiency area and she certainly has evidence of this on both sides 2/26; a difficult area on the left medial ankle. The patient clearly has chronic venous hypertension with some degree of lymphedema. The odd thing about the area is the small petechial hemorrhages. I am not really sure how to explain this. This was present last time and this is not a compression injury. We have been using Hydrofera Blue which I changed to last week 3/4; still using Hydrofera Blue. Aggressive debridement today. She does not have known arterial issues. She has seen Dr. Kellie Simmering at [facility] vein [facility] vascular and and has an ablation on the left. [Anterior accessory branch of the greater saphenous]. From what I remember they did not feel she had an arterial issue. The patient  has had this area biopsied in 2009 at [facility] dermatology and by her recollection they said this was "stasis". She is also follow-up with dermatology locally who thought that this was more of a vascular issue 3/11; using Hydrofera Blue. Aggressive debridement today. She does not have an arterial issue. We are using 3 layer compression although we may need to go to 4. The patient has been in for multiple changes to her wrap since I last saw her  a week ago. She says that the area was leaking. I do not have too much more information on what was found 01/19/19 on evaluation today patient was actually being seen for a nurse visit when unfortunately she had the area on her left lateral lower extremity as well as weeping from the right lower extremity that became apparent. Therefore we did end up actually seeing her for a full visit with myself. She is having some pain at this site as well but fortunately nothing too significant at this point. No fevers, chills, nausea, or vomiting noted at this time. 3/18-Patient is back to the clinic with the left leg venous leg ulcer, the ulcer is larger in size, has a surface that is densely adherent with fibrinous tissue, the Hydrofera Blue was used but is densely adherent and there was difficulty in removing it. The right lower extremity was also wrapped for weeping edema. Patient has a new area over the left lateral foot above the malleolus that is small and appears to have no debris with intact surrounding skin. Patient is on increased dose of Lasix also as a means to edema management 3/25; the patient has a nonhealing venous ulcer on the medial left leg and last week developed a smaller area on the lateral left calf. We have been using Hydrofera Blue with a contact layer. 4/1; no major change in these wounds areas. Left medial and more recently left lateral calf. I tried Iodoflex last week to aid in debridement she did not tolerate this. She stated her pain was  terrible all week. She took the top layer of the 4 layer compression off. 4/8; the patient actually looks somewhat better in terms of her more prominent left lateral calf wound. There is some healthy looking tissue here. She is still complaining of a lot of discomfort. 4/15; patient in a lot of pain secondary to sciatica. She is on a prednisone taper prescribed by her primary physician. She has the 2 areas one on the left medial and more recently a smaller area on the left lateral calf. Both of these just above the malleoli 4/22; her back pain is better but she still states she is very uncomfortable and now feels she is intolerant to the [employer]. No real change in the wounds we have been using Sorbact. She has been previously intolerant to Iodoflex. There is not a lot of option about what we can use to debride this wound under compression that she no doubt needs. sHe states Ultram no longer works for her pain 4/29; no major change in the wounds slightly increased depth. Surface on the original medial wound perhaps somewhat improved however the more recent area on the lateral left ankle is 100% covered in very adherent debris we have been using Sorbact. She tolerates 4 layer compression well and her edema control is a lot better. She has not had to come in for a nurse check 5/6; no major change in the condition of the wounds. She did consent to debridement today which was done with some difficulty. Continuing Sorbact. She did not tolerate Iodoflex. She was in for a check of her compression the day after we wrapped her last week this was adjusted but nothing much was found 5/13; no major change in the condition or area of the wounds. I was able to get a fairly aggressive debridement done on the lateral left leg wound. Even using Sorbact under compression. She came back in on Friday to have the wrap  changed. She says she felt uncomfortable on the lateral aspect of her ankle. She has a long history of  chronic venous insufficiency including previous ablation surgery on this side. 5/20-Patient returns for wounds on left leg with both wounds covered in slough, with the lateral leg wound larger in size, she has been in 3 layer compression and felt more comfortable, she describes pain in ankle, in leg and pins and needles in foot, and is about to try Pamelor for this 6/3; wounds on the left lateral and left medial leg. The area medially which is the most recent of the 2 seems to have had the largest increase in [name], [name]. ([id number]) dimensions. We have been using Sorbac to try and debride the surface. She has been to see orthopedics they apparently did a plain x-ray that was indeterminant. Diagnosed her with neuropathy and they have ordered an MRI to determine if there is underlying osteomyelitis. This was not high on my thought list but I suppose it is prudent. We have advised her to make an appointment with vein and vascular in [place]. She has a history of a left greater saphenous and accessory vein ablations I wonder if there is anything else that can be done from a surgical point of view to help in these difficult refractory wounds. We have previously healed this wound on one occasion but it keeps on reopening [medial side] 6/10; deep tissue culture I did last week I think on the left medial wound showed both moderate E. coli and moderate staph aureus [MSSA]. She is going to require antibiotics and I have chosen Augmentin. We have been using Sorbact and we have made better looking wound surface on both sides but certainly no improvement in wound area. She was back in last Friday apparently for a dressing changes the wrap was hurting her outer left ankle. She has not managed to get a hold of vein and vascular in [place]. We are going to have to make her that appointment 6/17; patient is tolerating the Augmentin. She had an MRI that I think was ordered by orthopedic surgeon this did not  show osteomyelitis or an abscess did suggest cellulitis. We have been using Sorbact to the lateral and medial ankles. We have been trying to arrange a follow-up appointment with vein and vascular in [place] or did her original ablations. We apparently an area sent the request to vein and vascular in [facility] 6/24; patient has completed the Augmentin. We do not yet have a vein and vascular appointment in [place]. I am not sure what the issue is here we have asked her to call tomorrow. We are using Sorbact. Making some improvements and especially the medial wound. Both surfaces however look better medial and lateral. 7/1; the patient has been in contact with vein and vascular in [place] but has not yet received an appointment. Using Sorbact we have gradually improve the wound surface with no improvement in surface area. She is approved for Apligraf but the wound surface still is not completely viable. She has not had to come in for a dressing change 7/8; the patient has an appointment with vein and vascular on 7/31 which is a Friday afternoon. She is concerned about getting back here for [place] to dress her wounds. I think it is important to have them goal for her venous reflux/history of ablations etc. to see if anything else can be done. She apparently tested positive for 1 of the blood tests with regards to lupus and  saw a rheumatologist. He has raised the issue of vasculitis again. I have had this thought in the past however the evidence seems overwhelming that this is a venous reflux etiology. If the rheumatologist tells me there is clinical and laboratory investigation is positive for lupus I will rethink this. 7/15; the patient's wound surfaces are quite a bit better. The medial area which was her original wound now has no depth although the lateral wound which was the more recent area actually appears larger. Both with viable surfaces which is indeed better. Using Sorbact. I wanted to  use Apligraf on her however there is the issue of the vein and vascular appointment on 7/31 at 2:00 in the afternoon which would not allow her to get back to be rewrapped and they would no doubt remove the graft 7/22; the patient's wound surfaces have moderate amount of debris although generally look better. The lateral one is larger with 2 small satellite areas superiorly. We are waiting for her vein and vascular appointment on 7/31. She has been approved for Apligraf which I would like to use after th 7/29; wound surfaces have improved no debridement is required we have been using Sorbact. She sees vein and vascular on Friday with this so question of whether anything can be done to lessen the likelihood of recurrence and/or speed the healing of these areas. She is already had previous ablations. She no doubt has severe venous hypertension 8/5-Patient returns at 1 week, she was in [place] for 3 days by her podiatrist, we have been using so backed to the wound, she has increased pain in both the wounds on the left lower leg especially the more distal one on the lateral aspect 8/12-Patient returns at 1 week and she is agreeable to having debridement in both wounds on her left leg today. We have been using Sorbact, and vascular studies were reviewed at last visit 8/19; the patient arrives with her wounds fairly clean and no debridement is required. We have used Sorbact which is really done a nice job in cleaning up these very difficult wound surfaces. The patient saw Dr. Donzetta Matters of vascular surgery on 7/31. He did not feel that there was an arterial component. He felt that her treated greater saphenous vein is adequately addressed and that the small saphenous vein did not appear to be involved significantly. She was also noted to have deep venous reflux which is not treatable. Dr. Donzetta Matters mentioned the possibility of a central obstructive component leading to reflux and he offered her central venography.  She wanted to discuss this or think about it. I have urged her to go ahead with this. She has had recurrent difficult wounds in these areas which do heal but after months in the clinic. If there is anything that can be done to reduce the likelihood of this I think it is worth it. 9/2 she is still working towards getting follow-up with Dr. Donzetta Matters to schedule her CT. Things are quite a bit worse venography. I put Apligraf on 2 weeks ago on both wounds on the medial and lateral part of her left lower leg. She arrives in clinic today with 3 superficial additional wounds above the area laterally and one below the wound medially. She describes a lot of discomfort. I think these are probably wrapped injuries. Does not look like she has cellulitis. 07/20/2019 on evaluation today patient appears to be doing somewhat poorly in regard to her lower extremity ulcers. She in fact showed signs of  erythema in fact we may even be dealing with an infection at this time. Unfortunately I am unsure if this is just infection or if indeed there may be some allergic reaction that occurred as a result of the Apligraf application. With that being said that would be unusual but nonetheless not impossible in this patient is one who is unfortunately allergic to quite a bit. Currently we have been using the Sorbact which seems to do as well as anything for her. I do think we may want to obtain a culture today to see if there is anything showing up there that may need to be addressed. 9/16; noted that last week the wounds look worse in 1 week follow-up of the Apligraf. Using Sorbact as of 2 days ago. She arrives with copious amounts of drainage and new skin breakdown on the back of the left calf. The wounds arm more substantial bilaterally. There is a fair amount of swelling in the left calf no overt DVT there is edema present I think in the left greater than right thigh. She is supposed to go on 9/28 for CT venography. The wounds on  the medial and lateral calf are worse and she has new skin breakdown posteriorly at least new for me. This is almost developing into a circumferential wound area The Apligraf was taken off last week which I agree with things are not going in the right direction a culture was done we do not have that back yet. She is on Augmentin that she started 2 days ago 9/23; dressing was changed by her nurses on Monday. In general there is no improvement in the wound areas although the area looks less angry than last week. She did get Augmentin for MSSA cultured on the 14th. She still appears to have too much swelling in the left leg even with 3 layer compression 9/30; the patient underwent her procedure on 9/28 by Dr. Donzetta Matters at vascular and vein specialist. She was discovered to have the common iliac vein measuring 12.2 mm but at the level of L4-L5 measured 3 mm. After stenting it measured 10 mm. 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 Rouleaux flow in the common femoral and femoral vein was observed much improved after stenting. We are using silver alginate to the wounds on the medial and lateral ankle on the left. 4 layer compression 10/7; the patient had fluid swelling around her knee and 4 layer compression. At the advice of vein and vascular this was reduced to 3 layer which she is tolerating better. We have been using silver alginate under 3 layer compression since last Friday 10/14; arrives with the areas on the left ankle looking a lot better. Inflammation in the area also a lot better. She came in for a nurse check on 10/9 10/21; continued nice improvement. Slight improvements in surface area of both the medial and lateral wounds on the left. A lot of the satellite lesions in the weeping erythema around these from stasis dermatitis is resolved. We have been using silver alginate [name], [name] ([id number]) 10/28; general improvement in the entire wound areas although not a lot of  change in dimensions the wound certainly looks better. There is a lot less in terms of venous inflammation. Continue silver alginate this week however look towards Hydrofera Blue next week 11/4; very adherent debris on the medial wound left wound is not as bad. We have been using silver alginate. Change to Hydrofera Blue today 11/11; very adherent  debris on both wound areas. She went to vein and vascular last week and follow-up they put in [place] boot on this today. He says the [facility] was adherent. Wound is definitely not as good as last week. Especially on the left there the satellite lesions look more prominent 11/18; absolutely no better. erythema on lateral aspect with tenderness. 09/30/2019 on evaluation today patient appears to actually be doing better. Dr. Dellia Nims did put her on doxycycline last week which I do believe has helped her at this point. Fortunately there is no signs of active infection at this time. No fevers, chills, nausea, vomiting, or diarrhea. I do believe he may want extend the doxycycline for 7 additional days just to ensure everything does completely cleared up the patient is in agreement with that plan. Otherwise she is going require some sharp debridement today 12/2; patient is completing a 2-week course of doxycycline. I gave her this empirically for inflammation as well as infection when I last saw her 2 weeks ago. All of this seems to be better. She is using silver alginate she has the area on the medial aspect of the larger area laterally and the 2 small satellite regions laterally above the major wound. 12/9; the patient's wound on the left medial and left lateral calf look really quite good. We have been using silver alginate. She saw vein and vascular in follow-up on 10/09/2019. She has had a previous left greater saphenous vein ablation by Dr. Oscar La in 2016. More recently she underwent a left common iliac vein stent by Dr. Donzetta Matters on 08/04/2019 due to May Thurner  type lesions. The swelling is improved and certainly the wounds have improved. The patient shows [place] today area on the right medial calf there is almost no wound but leaking lymphedema. She says she start this started 3 or 4 days ago. She did not traumatize it. It is not painful. She does not wear compression on that side 12/16; the patient continues to do well laterally. Medially still requiring debridement. The area on the right calf did not materialize to anything and is not currently open. We wrapped this last time. She has support stockings for that leg although I am not sure they are going to provide adequate compression 12/23; the lateral wound looks stable. Medially still requiring debridement for tightly adherent fibrinous debris. We've been using silver alginate. Surface area not any different 12/30; neither wound is any better with regards to surface and the area on the left lateral is larger. I been using silver alginate to the left lateral which look quite good last week and Sorbact to the left medial 11/11/2019. Lateral wound area actually looks better and somewhat smaller. Medial still requires a very aggressive debridement today. We have been using Sorbact on both wound areas 1/13; not much better still adherent debris bilaterally. I been using Sorbact. She has severe venous hypertension. Probably some degree of dermal fibrosis distally. I wonder whether tighter compression might help and I am going to try that today. We also need to work on the bioburden 1/20; using Sorbact. She has severe venous hypertension status post stent placement for pelvic vein compression. We applied gentamicin last time to see if we could reduce bioburden I had some discussion with her today about the use of pentoxifylline. This is occasionally used in this setting for wounds with refractory venous insufficiency. However this interacts with Plavix. She tells me that she was put on this after stent placement  for 3 months.  She will call Dr. Claretha Cooper office to discuss 1/27; we are using gentamicin under Sorbact. 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 She has a stent. Wound medially is measuring smaller this week. Laterally measuring slightly larger although she has some satellite lesions superiorly 2/3; gentamicin under Sorbact under 4-layer compression. 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 She has a stent on Plavix. Her wounds are measuring smaller this week. More substantially laterally where there is a satellite lesion superiorly. 2/10; gentamicin under Sorbac. 4-layer compression. Patient communicated with Dr. Donzetta Matters at [facility] vascular in [place]. He is okay with the patient coming off Plavix I will therefore start her on pentoxifylline for a 1 month trial. In general her wounds look better today. I had some concerns about swelling in the left thigh however she measures 61.5 on the right and 63 on the mid thigh which does not suggest there is any difficulty. The patient is not describing any pain. 2/17; gentamicin under Sorbac 4-layer compression. She has been on pentoxifylline for 1 week and complains of loose stool. No nausea she is eating and drinking well 2/24; the patient apparently came in 2 days ago for a nurse visit when her wrap fell down. Both areas look a little worse this week macerated medially and satellite lesions laterally. Change to silver alginate today 3/3; wounds are larger today especially medially. She also has more swelling in her foot lower leg and I even noted some swelling in her posterior thigh which is tender. I wonder about the patency of her stent. Fortuitously she sees Dr. Claretha Cooper group on Friday 3/10; Mrs. Grossberg was seen by vein and vascular on 3/5. The patient underwent ultrasound. There was no evidence of thrombosis involving the IVC no evidence of thrombosis involving the right common iliac vein there is  no evidence of thrombosis involving the right external iliac vein the left external vein is also patent. The right common iliac vein stent appears patent bilateral common femoral veins are compressible and appear patent. I was concerned about the left common iliac stent however it looks like this is functional. She has some edema in the posterior thigh that was tender she still has that this week. I also note they had trouble finding the pulses in her left foot and booked her for an ABI baseline in 4 weeks. She will follow up in 6 months for repeat IVC duplex. The patient stopped the pentoxifylline because of diarrhea. It does not look like that was being effective in any case. I have advised her to go back on her aspirin 81 mg tablet, vascular it also suggested this 3/17; comes in today with her wound surfaces a lot better. The excoriations from last week considerably better probably secondary to the TCA. We have been using silver alginate 3/24; comes in today with smaller wounds both medially and laterally. Both required debridement. There are 2 small satellite areas superiorly laterally. She also has a very odd bandlike area in the mid calf almost looking like there was a weakness in the wrap in a localized area. I would write this off as being this however anteriorly she has a small raised ballotable area that is very tender almost reminiscent of an abscess but there was no obvious purulent surface to it. 02/04/20 upon evaluation today patient appears to be doing fairly well in regard to her wounds today. Fortunately there is no signs of active infection at this time. No fevers, chills, nausea, vomiting, or  diarrhea. She has been tolerating the dressing changes without complication. Fortunately I feel like she is showing signs of improvement although has been sometime since have seen her. Nonetheless the area of concern that Dr. Dellia Nims had last week where she had possibly an area of the wrap that  was we can allow the leg to bulge appears to be doing significantly better today there is no signs of anything worsening. FARDOWSA, AUTHIER ([id number]) 4/7; the patient's wounds on her medial and lateral left leg continue to contract. We have been using a regular alginate. Last week she developed an area on the right medial lower leg which is probably a venous ulcer as well. 4/14; the wounds on her left medial and lateral lower leg continue to contract. Surface eschar. We have been using regular alginate. The area on the right medial lower leg is closed. We have been putting both legs under 4-layer contraction. The patient went back to see vein and vascular she had arterial studies done which were apparently "quite good" per the patient although I have not read their notes I have never felt she had an arterial issue. 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 This is been longstanding and refractory to exercise, leg elevation and longstanding use of compression wraps in our clinic as well as compression stockings on the times we have been able to get these to heal 4/21; we thought she actually might be close this week however she arrives in clinic with a lot of edema in her upper left calf and into her posterior thigh. This is been an intermittent problem here. She says the wrap fell down but it was replaced with a nurse visit on Monday. We are using calcium alginate to the wounds and the wound sizes there not terribly larger than last week but there is a lot more edema 4/28; again wound edges are smaller on both sides. Her edema is better controlled than last time. She is obtained her compression pumps from [employer] although they have not been to her home to set these up. 5/5; left medial and left lateral both look stable. I am not sure the medial is any smaller. We have been using calcium alginate under 4-layer compression. She had an area on  the right medial. This was eschared today. We have been wrapping this as well. She does not tolerate external compression stockings due to a history of various contact allergies. She has her compression pumps however the representative from the company is coming on her to show her how to use these tomorrow 5/19; patient with severe chronic venous insufficiency secondary to central venous disease. She had a stent placed in her left common iliac vein. She has done better since but still difficult to control wounds. She comes in today with nothing open on the right leg. Her areas on the left medial and left lateral are just about closed. We are using calcium alginate under 4-layer compression. She is using her external compression pumps at home She only has 15-20 support stockings. States she cannot get anything tighter than that on. 03/30/20-Patient returns at 1 week, the wounds on the left leg are both slightly bigger, the last week she was on 3 layer compression which started to slide down. She is starting to use her lymphedema pumps although she stated on 1 day her right ankle started to swell up and she have to stop that day. Unfortunately the open area seem to oscillate between  improving to the point of healing and then flaring up all to do with effectiveness of compression or lack of due to the left leg topography not keeping the compression wraps from rolling down 6/2 patient comes in with a 15/20 mmHg stocking on the right leg. She tells me that she developed a lot of swelling in her ankles she saw orthopedics she was felt to possibly be having a flare of pseudogout versus some other type of arthritis. She was put on steroids for a respiratory issue so that helps with the inflammation. She has not been using the pumps all week. She thinks the left thigh is more swollen than usual and I would agree with that. She has an appointment with Dr. Donzetta Matters 9 days or so from now 6/9; both wounds on the left  medial and left lateral are smaller. We have been using calcium alginate under compression. She does not have an open wound on the right leg she is using a stocking and her compression pumps things are going well. She has an appointment with Dr. Donzetta Matters with regards to her stent in the left common iliac vein 6/16; the wounds on the left medial and left lateral ankle continues to contract. The patient saw Dr. Donzetta Matters and I think he seems satisfied. Ordered follow-up venous reflux studies on both sides in September. Cautioned that she may need thigh-high stockings. She has been using calcium alginate under compression on the left and her own stocking on the right leg. She tells [place] there are no open wounds on the right 6/23; left lateral is just about closed. Medial required debridement today. We have been using calcium alginate. Extensive discussion about the compression pumps she is only using these on 25 mmHg states she could not take 40 or 30 when the wrap came out to her home to demonstrate these. He said they should not feel tight 6/30; the left lateral wound has a slight amount of eschar. . The area medially is about the same using Hydrofera Blue. Objective Constitutional Patient is hypertensive.. Pulse regular and within target range for patient.Marland Kitchen Respirations regular, non-labored and within target range.. Temperature is normal and within the target range for the patient.Marland Kitchen appears in no distress. Vitals Time Taken: 12:35 PM, Height: 63 in, Weight: 224.7 lbs, BMI: 39.8, Temperature: 98.3 [name], Pulse: 79 bpm, Respiratory Rate: 18 breaths/min, Blood Pressure: 141/67 mmHg. Cardiovascular Pedal pulses are palpable. Edema control from the mid tibial area towards the knee is marginal. Significant changes of chronic venous insufficiency. General Notes: Wound exam; medial side did not require debridement. Surface looks healthy. Laterally there is some eschar present however in the middle of this there is  normal epithelialization I did not think this requires debridement. Integumentary (Hair, Skin) Dermatosclerosis. Wound #5 status is Open. Original cause of wound was Gradually Appeared. The wound is located on the Left,Medial Lower Leg. The wound ANNELIES, COYT ([id number]) measures 1.5cm length x 1.3cm width x 0.1cm depth; 1.532cm^2 area and 0.153cm^3 volume. There is Fat Layer (Subcutaneous Tissue) Exposed exposed. There is no tunneling or undermining noted. There is a medium amount of serosanguineous drainage noted. The wound margin is flat and intact. There is medium (34-66%) red granulation within the wound bed. There is a medium (34-66%) amount of necrotic tissue within the wound bed including Adherent Slough. Wound #6 status is Open. Original cause of wound was Gradually Appeared. The wound is located on the Left,Lateral Lower Leg. The wound measures 0.1cm length x 0.1cm width  x 0.1cm depth; 0.008cm^2 area and 0.001cm^3 volume. There is no tunneling or undermining noted. There is a none present amount of drainage noted. There is no granulation within the wound bed. There is a large (67-100%) amount of necrotic tissue within the wound bed including Eschar. 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 There was a Four Layer Compression Therapy Procedure with a pre-treatment ABI of 1 by Cornell Barman, RN. Post procedure Diagnosis Wound #5: Same as Pre-Procedure Plan Anesthetic (add to Medication List): Wound #5 Left,Medial Lower Leg:  Topical Lidocaine 4% cream applied to wound bed prior to debridement (In Clinic Only). Wound #6 Left,Lateral Lower Leg: Topical Lidocaine 4% cream applied to wound bed prior to debridement (In Clinic Only). Primary Wound Dressing: Wound #5 Left,Medial Lower Leg: Hydrafera Blue Ready Transfer Wound #6 Left,Lateral Lower Leg: Hydrafera Blue Ready Transfer Secondary Dressing: Wound #5 Left,Medial Lower Leg: ABD pad Wound #6 Left,Lateral Lower Leg: ABD pad Dressing Change Frequency: Wound #5 Left,Medial Lower Leg: Change dressing every week Other: - Nurse visit Friday and Monday if needed. Wound #6 Left,Lateral Lower Leg: Change dressing every week Other: - Nurse visit Friday and Monday if needed. Follow-up Appointments: Wound #5 Left,Medial Lower Leg: Return Appointment in 1 week. Nurse Visit as needed Wound #6 Left,Lateral Lower Leg: Return Appointment in 1 week. Nurse Visit as needed Edema Control: Wound #5 Left,Medial Lower Leg: 4-Layer Compression System - Left Lower Extremity. LIDWINA, KANER ([id number]) Patient to wear own compression stockings - On Right Compression Pump: Use compression pump on left lower extremity for 60 minutes, twice daily. Compression Pump: Use compression pump on right lower extremity for 60 minutes, twice daily. Wound #6 Left,Lateral Lower Leg: 4-Layer Compression System - Left Lower Extremity. Patient to wear own compression stockings - On Right Compression Pump: Use compression pump on left lower extremity for 60 minutes, twice daily. Compression Pump: Use compression pump on right lower extremity for 60 minutes, twice daily. #1 continue with Hydrofera Blue as the primary dressing 2. Careful attention to the diameter of the small circular area medially next week. This looks reasonably healthy this week 3. The area on the left lateral is almost completely closed 4. She comes in for nurse visits mostly because the wrap comes down. She is using  her compression pumps twice a day Electronic Signature(s) Signed: 05/10/2020 7:31:01 AM By: Linton Ham MD Entered By: Linton Ham on 05/04/2020 13:01:14 SHAWNISE, PETERKIN ([id number]) -------------------------------------------------------------------------------- SuperBill Details Patient Name: OUMOU, SMEAD. Date of Service: 05/04/2020 Medical Record Number: [id number] Patient Account Number: [id number] Date of Birth/Sex: Aug 24, 1946 (74 y.o. 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 Facility Procedures  CPT4 Code: [id number] Description: (Facility Use Only) [phone] - [place] LWR LT LEG Modifier: Quantity: 1 Physician Procedures CPT4 Code Description: [id number] - WC PHYS LEVEL 3 - EST PT Modifier: Quantity: 1 CPT4 Code Description: ICD-10 Diagnosis Description L97.221 Non-pressure chronic ulcer of left calf limited to breakdown of skin I89.0 Lymphedema, not elsewhere classified I87.332 Chronic venous hypertension (idiopathic) with ulcer and inflammation of Modifier: left lower extremity Quantity: Electronic Signature(s) Signed: 05/10/2020 7:31:01 AM By: Linton Ham MD Entered By: Linton Ham on 05/04/2020 13:02:12

## 2020-05-10 NOTE — Progress Notes (Signed)
DEDE, DOBESH ([id number]) Visit Report for 05/04/2020 Arrival Information Details Patient Name: [name], [name]. Date of Service: 05/04/2020 12:30 PM Medical Record Number: [id number] Patient Account Number: [id number] Date of Birth/Sex: 1946-04-25 (74 y.o. F) Treating RN: Cornell Barman Primary Care Yaasir Menken: [name] Other Clinician: Referring Alicha Raspberry: [name] Treating Shylah Dossantos/Extender: Tito Dine in Treatment: 59 Visit Information History Since Last Visit Added or deleted any medications: No Patient Arrived: Ambulatory Any new allergies or adverse reactions: No Arrival Time: 12:34 Had a fall or experienced change in No Accompanied By: self activities of daily living that may affect Transfer Assistance: None risk of falls: Patient Identification Verified: Yes Signs or symptoms of abuse/neglect since last visito No Secondary Verification Process Completed: Yes Hospitalized since last visit: No Patient Requires Transmission-Based No Implantable device outside of the clinic excluding No Precautions: cellular tissue based products placed in the center Patient Has Alerts: Yes since last visit: Patient Alerts: Patient on Blood Has Dressing in Place as Prescribed: Yes Thinner Pain Present Now: No aspirin 81 Electronic Signature(s) Signed: 05/04/2020 4:40:20 PM By: Lorine Bears RCP, RRT, CHT Entered By: Lorine Bears on 05/04/2020 12:36:50 [place], [name] ([id number]) -------------------------------------------------------------------------------- Compression Therapy Details Patient Name: [name]. Date of Service: 05/04/2020 12:30 PM Medical Record Number: [id number] Patient Account Number: [id number] Date of Birth/Sex: 1946-05-03 (74 y.o. F) Treating RN: Cornell Barman Primary Care Jhoselin Crume: [name] Other Clinician: Referring Norina Cowper: [name] Treating Dontai Pember/Extender: Tito Dine  in Treatment: 73 Compression Therapy Performed for Wound Assessment: Wound #5 Left,Medial Lower Leg Performed By: Clinician Cornell Barman, RN Compression Type: Four Layer Pre Treatment ABI: 1 Post Procedure Diagnosis Same as Pre-procedure Electronic Signature(s) Signed: 05/04/2020 2:35:46 PM By: [name], [name], RN, CWS, [name], [name] Entered By: [name], [name], RN, CWS, Kim on 05/04/2020 12:51:39 [name] ([id number]) -------------------------------------------------------------------------------- Encounter Discharge Information Details Patient Name: [name]. Date of Service: 05/04/2020 12:30 PM Medical Record Number: [id number] Patient Account Number: [id number] Date of Birth/Sex: [birth year] (74 y.o. F) Treating RN: Cornell Barman Primary Care Carmell Elgin: [name] Other Clinician: Referring Mahitha Hickling: [name] Treating Louana Fontenot/Extender: Tito Dine in Treatment: 60 Encounter Discharge Information Items Discharge Condition: Stable Ambulatory Status: Ambulatory Discharge Destination: Home Transportation: Private Auto Accompanied By: self Schedule Follow-up Appointment: Yes Clinical Summary of Care: Electronic Signature(s) Signed: 05/04/2020 2:35:46 PM By: [name], [name], RN, CWS, [name], [name] Entered By: [name], [name], RN, CWS, Kim on 05/04/2020 12:54:21 [name] ([id number]) -------------------------------------------------------------------------------- Lower Extremity Assessment Details Patient Name: [name]. Date of Service: 05/04/2020 12:30 PM Medical Record Number: [id number] Patient Account Number: [id number] Date of Birth/Sex: 07/27/1946 (74 y.o. F) Treating RN: Army Melia Primary Care Jumaane Weatherford: [name] Other Clinician: Referring Reighan Hipolito: [name] Treating York Valliant/Extender: Tito Dine in Treatment: 73 Edema Assessment Assessed: [Left: No] [Right: No] Edema: [Left: N] [Right: o] Calf Left: Right: Point  of Measurement: 33 cm From Medial Instep 41 cm cm Ankle Left: Right: Point of Measurement: 10 cm From Medial Instep 23 cm cm Vascular Assessment Pulses: Dorsalis Pedis Palpable: [Left:Yes] Electronic Signature(s) Signed: 05/04/2020 2:19:10 PM By: Army Melia Entered By: Army Melia on 05/04/2020 12:43:24 Speak, [name] ([id number]) -------------------------------------------------------------------------------- Multi Wound Chart Details Patient Name: [name]. Date of Service: 05/04/2020 12:30 PM Medical Record Number: [id number] Patient Account Number: [id number] Date of Birth/Sex: May 31, 1946 (74 y.o. F) Treating RN: Cornell Barman Primary Care Jerzi Tigert: [name] Other Clinician: Referring Merville Hijazi: [name] Treating Evona Westra/Extender:  ROBSON, MICHAEL G Weeks in Treatment: 73 Vital Signs Height(in): 63 Pulse(bpm): 79 Weight(lbs): 224.7 Blood Pressure(mmHg): 141/67 Body Mass Index(BMI): 40 Temperature(F): 98.3 Respiratory Rate(breaths/min): 18 Photos: [N/A:N/A] Wound Location: Left, Medial Lower Leg Left, Lateral Lower Leg N/A Wounding Event: Gradually Appeared Gradually Appeared N/A Primary Etiology: Lymphedema Venous Leg Ulcer N/A Comorbid History: Cataracts, Asthma, Sleep Apnea, Cataracts, Asthma, Sleep Apnea, N/A Deep Vein Thrombosis, Deep Vein Thrombosis, Hypertension, Peripheral Venous Hypertension, Peripheral Venous Disease, Osteoarthritis, Received Disease, Osteoarthritis, Received Chemotherapy, Received Radiation Chemotherapy, Received Radiation Date Acquired: 11/19/2018 01/19/2019 N/A Weeks of Treatment: 73 67 N/A Wound Status: Open Open N/A Measurements L x W x D (cm) 1.5x1.3x0.1 0.1x0.1x0.1 N/A Area (cm) : 1.532 0.008 N/A Volume (cm) : 0.153 0.001 N/A % Reduction in Area: 82.10% 96.40% N/A % Reduction in Volume: 82.10% 95.50% N/A Classification: Full Thickness Without Exposed Full Thickness Without Exposed N/A Support Structures  Support Structures Exudate Amount: Medium None Present N/A Exudate Type: Serosanguineous N/A N/A Exudate Color: red, brown N/A N/A Wound Margin: Flat and Intact N/A N/A Granulation Amount: Medium (34-66%) None Present (0%) N/A Granulation Quality: Red N/A N/A Necrotic Amount: Medium (34-66%) Large (67-100%) N/A Necrotic Tissue: Adherent Slough Eschar N/A Exposed Structures: Fat Layer (Subcutaneous Tissue) Fascia: No N/A Exposed: Yes Fat Layer (Subcutaneous Tissue) Fascia: No Exposed: No Tendon: No Tendon: No Muscle: No Muscle: No Joint: No Joint: No Bone: No Bone: No Epithelialization: None Medium (34-66%) N/A Procedures Performed: Compression Therapy N/A N/A Treatment Notes Wound #5 (Left, Medial Lower Leg) Notes Rance, Tamra J. ([id number]) Hydrofera Blue, ABD, 4 layer Wound #6 (Left, Lateral Lower Leg) Notes Hydrofera Blue, ABD, 4 layer Electronic Signature(s) Signed: 05/10/2020 7:31:01 AM By: Linton Ham MD Entered By: Linton Ham on 05/04/2020 12:56:52 [place], [name] ([id number]) -------------------------------------------------------------------------------- Multi-Disciplinary Care Plan Details Patient Name: NANCYANN, COTTERMAN. Date of Service: 05/04/2020 12:30 PM Medical Record Number: [id number] Patient Account Number: [id number] Date of Birth/Sex: 04/25/1946 (74 y.o. F) Treating RN: Cornell Barman Primary Care Burhan Barham: [name] Other Clinician: Referring Zacariah Belue: [name] Treating Rich [name]/Extender: Tito Dine in Treatment: 52 Active Inactive Medication Nursing Diagnoses: Knowledge deficit related to medication safety: actual or potential Goals: Patient/caregiver will demonstrate understanding of new oral/IV medications prescribed at the [facility] (topical prescriptions are covered under the skin breakdown problem) Date Initiated: 12/16/2019 Target Resolution Date: 01/13/2020 Goal Status: Active Interventions: Assess for  medication contraindications each visit where new medications are prescribed Treatment Activities: New medication prescribed at [name] : 12/16/2019 Notes: Soft Tissue Infection Nursing Diagnoses: Impaired tissue integrity Goals: Patient's soft tissue infection will resolve Date Initiated: 12/10/2018 Target Resolution Date: 01/09/2019 Goal Status: Active Interventions: Assess signs and symptoms of infection every visit Notes: Venous Leg Ulcer Nursing Diagnoses: Actual venous Insuffiency (use after diagnosis is confirmed) Goals: Patient will maintain optimal edema control Date Initiated: 12/10/2018 Target Resolution Date: 01/09/2019 Goal Status: Active Interventions: Assess peripheral edema status every visit. Treatment Activities: Therapeutic compression applied : 12/10/2018 Notes: Wound/Skin Impairment Nursing Diagnoses: LORITA, FORINASH ([id number]) Impaired tissue integrity Goals: Patient/caregiver will verbalize understanding of skin care regimen Date Initiated: 12/10/2018 Target Resolution Date: 01/09/2019 Goal Status: Active Interventions: Assess ulceration(s) every visit Treatment Activities: Topical wound management initiated : 12/10/2018 Notes: Electronic Signature(s) Signed: 05/04/2020 2:35:46 PM By: [name], [name], RN, CWS, [name], [name] Entered By: [name], [name], RN, CWS, Kim on 05/04/2020 12:50:58 [name] ([id number]) -------------------------------------------------------------------------------- Pain Assessment Details Patient Name: TAM, SAVOIA. Date of Service: 05/04/2020 12:30 PM Medical Record Number: [id number] Patient  Account Number: [id number] Date of Birth/Sex: 1946/10/16 (74 y.o. F) Treating RN: Army Melia Primary Care Elnita Surprenant: [name] Other Clinician: Referring Wynell Halberg: [name] Treating Mychael Smock/Extender: Tito Dine in Treatment: 40 Active Problems Location of Pain Severity and Description of Pain Patient Has  Paino No Site Locations Pain Management and Medication Current Pain Management: Electronic Signature(s) Signed: 05/04/2020 2:19:10 PM By: Army Melia Entered By: Army Melia on 05/04/2020 12:42:36 Ulloa, [name] ([id number]) -------------------------------------------------------------------------------- Patient/Caregiver Education Details Patient Name: GIAVANA, ROOKE. Date of Service: 05/04/2020 12:30 PM Medical Record Number: [id number] Patient Account Number: [id number] Date of Birth/Gender: 1946/07/16 (74 y.o. F) Treating RN: Cornell Barman Primary Care Physician: [name] Other Clinician: Referring Physician: [name] Treating Physician/Extender: Tito Dine in Treatment: 68 Education Assessment Education Provided To: Patient Education Topics Provided Wound/Skin Impairment: Handouts: Caring for Your Ulcer Methods: Demonstration, Explain/Verbal Responses: State content correctly Electronic Signature(s) Signed: 05/04/2020 2:35:46 PM By: [name], [name], RN, CWS, [name], [name] Entered By: [name], [name], RN, CWS, Kim on 05/04/2020 12:53:30 [name] ([id number]) -------------------------------------------------------------------------------- Wound Assessment Details Patient Name: TIMYA, TRIMMER. Date of Service: 05/04/2020 12:30 PM Medical Record Number: [id number] Patient Account Number: [id number] Date of Birth/Sex: 02/08/1946 (74 y.o. F) Treating RN: Army Melia Primary Care Gertude Benito: [name] Other Clinician: Referring Sharyl Panchal: [name] Treating Tammee Thielke/Extender: Tito Dine in Treatment: 47 Wound Status Wound Number: 5 Primary Lymphedema Etiology: Wound Location: Left, Medial Lower Leg Wound Open Wounding Event: Gradually Appeared Status: Date Acquired: 11/19/2018 Comorbid Cataracts, Asthma, Sleep Apnea, Deep Vein Thrombosis, Weeks Of Treatment: 73 History: Hypertension, Peripheral Venous Disease,  Osteoarthritis, Clustered Wound: No Received Chemotherapy, Received Radiation Photos Wound Measurements Length: (cm) 1.5 Width: (cm) 1.3 Depth: (cm) 0.1 Area: (cm) 1.532 Volume: (cm) 0.153 % Reduction in Area: 82.1% % Reduction in Volume: 82.1% Epithelialization: None Tunneling: No Undermining: No Wound Description Classification: Full Thickness Without Exposed Support Structures Wound Margin: Flat and Intact Exudate Amount: Medium Exudate Type: Serosanguineous Exudate Color: red, brown Foul Odor After Cleansing: No Slough/Fibrino Yes Wound Bed Granulation Amount: Medium (34-66%) Exposed Structure Granulation Quality: Red Fascia Exposed: No Necrotic Amount: Medium (34-66%) Fat Layer (Subcutaneous Tissue) Exposed: Yes Necrotic Quality: Adherent Slough Tendon Exposed: No Muscle Exposed: No Joint Exposed: No Bone Exposed: No Treatment Notes Wound #5 (Left, Medial Lower Leg) Notes Hydrofera Blue, ABD, 4 layer Electronic Signature(s) MATAYA, KILDUFF ([id number]) Signed: 05/04/2020 2:19:10 PM By: Army Melia Entered By: Army Melia on 05/04/2020 12:44:22 [place], [name] ([id number]) -------------------------------------------------------------------------------- Wound Assessment Details Patient Name: DAENA, ALPER. Date of Service: 05/04/2020 12:30 PM Medical Record Number: [id number] Patient Account Number: [id number] Date of Birth/Sex: [birth year] (74 y.o. F) Treating RN: Army Melia Primary Care Renny Gunnarson: [name] Other Clinician: Referring Nyari Olsson: [name] Treating Mecca Guitron/Extender: Tito Dine in Treatment: 22 Wound Status Wound Number: 6 Primary Venous Leg Ulcer Etiology: Wound Location: Left, Lateral Lower Leg Wound Open Wounding Event: Gradually Appeared Status: Date Acquired: 01/19/2019 Comorbid Cataracts, Asthma, Sleep Apnea, Deep Vein Thrombosis, Weeks Of Treatment: 67 History: Hypertension, Peripheral Venous  Disease, Osteoarthritis, Clustered Wound: No Received Chemotherapy, Received Radiation Photos Wound Measurements Length: (cm) 0.1 Width: (cm) 0.1 Depth: (cm) 0.1 Area: (cm) 0.008 Volume: (cm) 0.001 % Reduction in Area: 96.4% % Reduction in Volume: 95.5% Epithelialization: Medium (34-66%) Tunneling: No Undermining: No Wound Description Classification: Full Thickness Without Exposed Support Structures Exudate Amount: None Present Foul Odor After Cleansing: No Slough/Fibrino Yes Wound Bed Granulation Amount: None Present (0%) Exposed Structure  Necrotic Amount: Large (67-100%) Fascia Exposed: No Necrotic Quality: Eschar Fat Layer (Subcutaneous Tissue) Exposed: No Tendon Exposed: No Muscle Exposed: No Joint Exposed: No Bone Exposed: No Treatment Notes Wound #6 (Left, Lateral Lower Leg) Notes Hydrofera Blue, ABD, 4 layer Electronic Signature(s) Signed: 05/04/2020 2:19:10 PM By: Army Melia Entered By: Army Melia on 05/04/2020 12:45:00 [name], CHOVAN ([id number]) Cathell, [name] ([id number]) -------------------------------------------------------------------------------- Vitals Details Patient Name: JEMMIE, LEDGERWOOD. Date of Service: 05/04/2020 12:30 PM Medical Record Number: [id number] Patient Account Number: [id number] Date of Birth/Sex: October 31, 1946 (74 y.o. F) Treating RN: Cornell Barman Primary Care Teosha Casso: [name] Other Clinician: Referring Dyanna Seiter: [name] Treating Kip Kautzman/Extender: Tito Dine in Treatment: 73 Vital Signs Time Taken: 12:35 Temperature (F): 98.3 Height (in): 63 Pulse (bpm): 79 Weight (lbs): 224.7 Respiratory Rate (breaths/min): 18 Body Mass Index (BMI): 39.8 Blood Pressure (mmHg): 141/67 Reference Range: 80 - 120 mg / dl Electronic Signature(s) Signed: 05/04/2020 4:40:20 PM By: Lorine Bears RCP, RRT, CHT Entered By: Lorine Bears on 05/04/2020 12:39:59

## 2020-05-11 ENCOUNTER — Other Ambulatory Visit: Payer: Self-pay

## 2020-05-11 ENCOUNTER — Encounter: Payer: Medicare Other | Attending: Internal Medicine | Admitting: Internal Medicine

## 2020-05-11 DIAGNOSIS — I83009 Varicose veins of unspecified lower extremity with ulcer of unspecified site: Secondary | ICD-10-CM | POA: Diagnosis not present

## 2020-05-11 DIAGNOSIS — L97221 Non-pressure chronic ulcer of left calf limited to breakdown of skin: Secondary | ICD-10-CM | POA: Insufficient documentation

## 2020-05-11 DIAGNOSIS — I82592 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: Secondary | ICD-10-CM | POA: Diagnosis not present

## 2020-05-11 DIAGNOSIS — I87332 Chronic venous hypertension (idiopathic) with ulcer and inflammation of left lower extremity: Secondary | ICD-10-CM | POA: Diagnosis not present

## 2020-05-11 DIAGNOSIS — I890 Lymphedema, not elsewhere classified: Secondary | ICD-10-CM | POA: Diagnosis not present

## 2020-05-12 NOTE — Progress Notes (Signed)
IRA, BUSBIN ([id number]) Visit Report for 05/11/2020 HPI Details Patient Name: [name], [name]. Date of Service: 05/11/2020 2:30 PM Medical Record Number: [id number] Patient Account Number: [id number] Date of Birth/Sex: 1946/04/01 (74 y.o. F) Treating RN: Cornell Barman Primary Care Provider: [name] Other Clinician: Referring Provider: [name] Treating Provider/Extender: Tito Dine in Treatment: 23 History of Present Illness HPI Description: Pleasant 74 year old with history of chronic venous insufficiency. No diabetes or peripheral vascular disease. Left ABI 1.29. Questionable history of left lower extremity DVT. She developed a recurrent ulceration on her left lateral calf in December 2015, which she attributes to poor diet and subsequent lower extremity edema. She underwent endovenous laser ablation of her left greater saphenous vein in 2010. She underwent laser ablation of accessory branch of left GSV in April 2016 by Dr. Kellie Simmering at [facility]. She was previously wearing Unna boots, which she tolerated well. Tolerating 2 layer compression and cadexomer iodine. She returns to clinic for follow-up and is without new complaints. She denies any significant pain at this time. She reports persistent pain with pressure. No claudication or ischemic rest pain. No fever or chills. No drainage. READMISSION 11/13/16; this is a 74 year old woman who is not a diabetic. She is here for a review of a painful area on her left medial lower extremity. I note that she was seen here previously last year for wound I believe to be in the same area. At that time she had undergone previously a left greater saphenous vein ablation by Dr. Kellie Simmering and she had a ablation of the anterior accessory branch of the left greater saphenous vein in March 2016. Seeing that the wound actually closed over. In reviewing the history with her today the ulcer in this area has been recurrent. She describes a  biopsy of this area in 2009 that only showed stasis physiology. 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 Do not see that she has ever had arterial studies in the left leg. She tells me she has a follow-up with Dr. Kellie Simmering in roughly 10 days In any case she developed the reopening of this area roughly a month ago. On the background of this she describes rapidly increasing edema which has responded to Lasix 40 mg and metolazone 2.5 mg as well as the patient's lymph massage. She has been told she has both venous insufficiency and lymphedema but she cannot tolerate compression stockings 11/28/16; the patient saw Dr. Kellie Simmering recently. Per the patient he did arterial Dopplers in the office that did not show evidence of arterial insufficiency, per the patient he stated "treat this like an ordinary venous ulcer". She also saw her dermatologist Dr. Ronnald Ramp who felt that this was more of a vascular ulcer. In general things are improving although she arrives today with increasing bilateral lower extremity edema with weeping a deeper fluid through the wound on the left medial leg compatible with some degree of lymphedema 12/04/16; the patient's wound is fully epithelialized but I don't think fully healed. We will do another week of depression with Promogran and TCA however I suspect we'll be able to discharge her next week. This is a very unusual-looking wound which was initially a figure-of-eight type wound lying on its side surrounded by petechial like hemorrhage. She  has had venous ablation on this side. She apparently does not have an arterial issue per Dr. Kellie Simmering. She saw her dermatologist thought it was "vascular". Patient is definitely going to need ongoing compression and I talked about this  with her today she will go to elastic therapy after she leaves here next week 12/11/16; the patient's wound is not completely closed today. She has surrounding scar tissue and in further discussion with the patient it would appear that she had ulcers in this area in 2009 for a prolonged period of time ultimately requiring a punch biopsy of this area that only showed venous insufficiency. I did not previously pickup on this part of the history from the patient. 12/18/16; the patient's wound is completely epithelialized. There is no open area here. She has significant bilateral venous insufficiency with secondary lymphedema to a mild-to-moderate degree she does not have compression stockings.. She did not say anything to me when I was in the room, she told our intake nurse that she was still having pain in this area. This isn't unusual recurrent small open area. She is going to go to elastic therapy to obtain compression stockings. 12/25/16; the patient's wound is fully epithelialized. There is no open area here. The patient describes some continued episodic discomfort in this area medial left calf. However everything looks fine and healed here. She is been to elastic therapy and caught herself 15-20 mmHg stockings, they apparently were having trouble getting 20-30 mm stockings in her size 01/22/17; this is a patient we discharged from the clinic a month ago. She has a recurrent open wound on her medial left calf. She had 15 mm support stockings. I told her I thought she needed 20-30 mm compression stockings. She tells me that she has been ill with hospitalization secondary to asthma and is been found to have severe hypokalemia likely secondary to a combination of Lasix and metolazone. This morning she noted blistering and leaking fluid on the posterior part of her left leg. She called our intake nurse urgently and we was saw her this afternoon. She has not had any real discomfort here. I don't know that  she's been wearing any stockings on this leg for at least 2-3 days. ABIs in this clinic were 1.21 on the right and 1.3 on the left. She is previously seen vascular surgery who does not think that there is a peripheral arterial issue. 01/30/17; Patient arrives with no open wound on the left leg. She has been to elastic therapy and obtained 20-77mmhg below knee stockings and she has one on the right leg today. READMISSION 02/19/18; this Norgard is a now 74 year old patient we've had in this clinic perhaps 3 times before. I had last looked at her from January 07 December 2016 with an area on the medial left leg. We discharged her on 12/25/16 however she had to be readmitted on 01/22/17 with a recurrence. I have in my notes that we discharged her on 20-30 mm stockings although she tells me she was only wearing support hose because she cannot get stockings on predominantly related to her cervical spine surgery/issues. She has had previous ablations done by vein and vascular in [place] including a great saphenous vein ablation on the left with an anterior accessory branch ablation I think both of these were in 2016. On one of the previous visit she had a biopsy noted 2009 that was negative. She is not felt to have an arterial issue. She is not a diabetic. She does  have a history of obstructive sleep apnea hypertension asthma as well as chronic venous insufficiency and lymphedema. On this occasion she noted 2 dry scaly patch on her left leg. She tried to put lotion on this it didn't really help. There were 2 open areas.the KRYSTYNE, [place] ([id number]) patient has been seeing her primary physician from 02/05/18 through 02/14/18. She had Unna boots applied. The superior wound now on the lateral left leg has closed but she's had one wound that remains open on the lateral left leg. This is not the same spot as we dealt with in 2018. ABIs in this clinic were 1.3 bilaterally 02/26/18; patient has a small wound on the  left lateral calf. Dimensions are down. She has chronic venous insufficiency and lymphedema. 03/05/18; small open area on the left lateral calf. Dimensions are down. Tightly adherent necrotic debris over the surface of the wound which was difficult to remove. Also the dressing [over collagen] stuck to the wound surface. This was removed with some difficulty as well. Change the primary dressing to Hydrofera Blue ready 03/12/18; small open area on the left lateral calf. Comes in with tightly adherent surface eschar as well as some adherent Hydrofera Blue. 03/19/18; open area on the left lateral calf. Again adherent surface eschar as well as some adherent Hydrofera Blue nonviable subcutaneous tissue. She complained of pain all week even with the reduction from 4-3 layer compression I put on last week. Also she had an increase in her ankle and calf measurements probably related to the same thing. 03/26/18; open area on the left lateral calf. A very small open area remains here. We used silver alginate starting last week as the Hydrofera Blue seem to stick to the wound bed. In using 4-layer compression 04/02/18; the open area in the left lateral calf at some adherent slough which I removed there is no open area here. We are able to transition her into her own compression stocking. Truthfully I think this is probably his support hose. However this does not maintain skin integrity will be limited. She cannot put over the toe compression stockings on because of neck problems hand problems etc. She is allergic to the lining layer of juxta lites. We might be forced to use extremitease stocking should this fail READMIT 11/24/2018 Patient is now a 74 year old woman who is not a diabetic. She has been in this clinic on at least 3 previous occasions largely with recurrent wounds on her left leg secondary to chronic venous insufficiency with secondary lymphedema. Her situation is complicated by inability to get stockings  on and an allergy to neoprene which is apparently a component and at least juxta lites and other stockings. As a result she really has not been wearing any stockings on her legs. She tells [place] that roughly 2 or 3 weeks ago she started noticing a stinging sensation just above her ankle on the left medial aspect. She has been diagnosed with pseudogout and she wondered whether this was what she was experiencing. She tried to dress this with something she bought at the store however subsequently it pulled skin off and now she has an open wound that is not improving. She has been using Vaseline gauze with a cover bandage. She saw her primary doctor last week who put an Haematologist on her. ABIs in this clinic was 1.03 on the left 2/12; the area is on the left medial ankle. Odd-looking wound with what looks to be surface epithelialization but a multitude of  small petechial openings. This clearly not closed yet. We have been using silver alginate under 3 layer compression with TCA 2/19; the wound area did not look quite as good this week. Necrotic debris over the majority of the wound surface which required debridement. She continues to have a multitude of what looked to be small petechial openings. She reminds [place] that she had a biopsy on this initially during her first outbreak in 2015 in [place] [facility]. She expresses concern about this being a possible melanoma. 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 I assured her that this does not look anything like melanoma. Except for the petechial reaction it does look like a venous insufficiency area and she certainly has evidence of this on both sides 2/26; a difficult area on the left medial ankle. The patient clearly has chronic venous hypertension with some degree of lymphedema. The odd thing about the area is the small petechial hemorrhages. I am not really sure how  to explain this. This was present last time and this is not a compression injury. We have been using Hydrofera Blue which I changed to last week 3/4; still using Hydrofera Blue. Aggressive debridement today. She does not have known arterial issues. She has seen Dr. Kellie Simmering at [facility] vein and vascular and and has an ablation on the left. [Anterior accessory branch of the greater saphenous]. From what I remember they did not feel she had an arterial issue. The patient has had this area biopsied in 2009 at [facility] dermatology and by her recollection they said this was "stasis". She is also follow-up with dermatology locally who thought that this was more of a vascular issue 3/11; using Hydrofera Blue. Aggressive debridement today. She does not have an arterial issue. We are using 3 layer compression although we may need to go to 4. The patient has been in for multiple changes to her wrap since I last saw her a week ago. She says that the area was leaking. I do not have too much more information on what was found 01/19/19 on evaluation today patient was actually being seen for a nurse visit when unfortunately she had the area on her left lateral lower extremity as well as weeping from the right lower extremity that became apparent. Therefore we did end up actually seeing her for a full visit with myself. She is having some pain at this site as well but fortunately nothing too significant at this point. No fevers, chills, nausea, or vomiting noted at this time. 3/18-Patient is back to the clinic with the left leg venous leg ulcer, the ulcer is larger in size, has a surface that is densely adherent with fibrinous tissue, the Hydrofera Blue was used but is densely adherent and there was difficulty in removing it. The right lower extremity was also wrapped for weeping edema. Patient has a new area over the left lateral foot above the malleolus that is small and appears to have no debris with intact  surrounding skin. Patient is on increased dose of Lasix also as a means to edema management 3/25; the patient has a nonhealing venous ulcer on the medial left leg and last week developed a smaller area on the lateral left calf. We have been using Hydrofera Blue with a contact layer. 4/1; no major change in these wounds areas. Left medial and more recently left lateral calf. I tried Iodoflex last week to aid in debridement  she did not tolerate this. She stated her pain was terrible all week. She took the top layer of the 4 layer compression off. 4/8; the patient actually looks somewhat better in terms of her more prominent left lateral calf wound. There is some healthy looking tissue here. She is still complaining of a lot of discomfort. 4/15; patient in a lot of pain secondary to sciatica. She is on a prednisone taper prescribed by her primary physician. She has the 2 areas one on the left medial and more recently a smaller area on the left lateral calf. Both of these just above the malleoli 4/22; her back pain is better but she still states she is very uncomfortable and now feels she is intolerant to the [employer]. No real change in the wounds we have been using Sorbact. She has been previously intolerant to Iodoflex. There is not a lot of option about what we can use to debride this wound under compression that she no doubt needs. sHe states Ultram no longer works for her pain 4/29; no major change in the wounds slightly increased depth. Surface on the original medial wound perhaps somewhat improved however the more recent area on the lateral left ankle is 100% covered in very adherent debris we have been using Sorbact. She tolerates 4 layer compression well and her edema control is a lot better. She has not had to come in for a nurse check 5/6; no major change in the condition of the wounds. She did consent to debridement today which was done with some difficulty. Continuing Sorbact. She did not  tolerate Iodoflex. She was in for a check of her compression the day after we wrapped her last week this was adjusted but nothing much was found 5/13; no major change in the condition or area of the wounds. I was able to get a fairly aggressive debridement done on the lateral left leg wound. Even using Sorbact under compression. She came back in on Friday to have the wrap changed. She says she felt uncomfortable on the BEVAN, VU. ([id number]) lateral aspect of her ankle. She has a long history of chronic venous insufficiency including previous ablation surgery on this side. 5/20-Patient returns for wounds on left leg with both wounds covered in slough, with the lateral leg wound larger in size, she has been in 3 layer compression and felt more comfortable, she describes pain in ankle, in leg and pins and needles in foot, and is about to try Pamelor for this 6/3; wounds on the left lateral and left medial leg. The area medially which is the most recent of the 2 seems to have had the largest increase in dimensions. We have been using Sorbac to try and debride the surface. She has been to see orthopedics they apparently did a plain x-ray that was indeterminant. Diagnosed her with neuropathy and they have ordered an MRI to determine if there is underlying osteomyelitis. This was not high on my thought list but I suppose it is prudent. We have advised her to make an appointment with vein and vascular in [place]. She has a history of a left greater saphenous and accessory vein ablations I wonder if there is anything else that can be done from a surgical point of view to help in these difficult refractory wounds. We have previously healed this wound on one occasion but it keeps on reopening [medial side] 6/10; deep tissue culture I did last week I think on the left medial wound showed  both moderate E. coli and moderate staph aureus [MSSA]. She is going to require antibiotics and I have chosen  Augmentin. We have been using Sorbact and we have made better looking wound surface on both sides but certainly no improvement in wound area. She was back in last Friday apparently for a dressing changes the wrap was hurting her outer left ankle. She has not managed to get a hold of vein and vascular in [place]. We are going to have to make her that appointment 6/17; patient is tolerating the Augmentin. She had an MRI that I think was ordered by orthopedic surgeon this did not show osteomyelitis or an abscess did suggest cellulitis. We have been using Sorbact to the lateral and medial ankles. We have been trying to arrange a follow-up appointment with vein and vascular in [place] or did her original ablations. We apparently an area sent the request to vein and vascular in [facility] 6/24; patient has completed the Augmentin. We do not yet have a vein and vascular appointment in [place]. I am not sure what the issue is here we have asked her to call tomorrow. We are using Sorbact. Making some improvements and especially the medial wound. Both surfaces however look better medial and lateral. 7/1; the patient has been in contact with vein and vascular in [place] but has not yet received an appointment. Using Sorbact we have gradually improve the wound surface with no improvement in surface area. She is approved for Apligraf but the wound surface still is not completely viable. She has not had to come in for a dressing change 7/8; the patient has an appointment with vein and vascular on 7/31 which is a Friday afternoon. She is concerned about getting back here for [place] to dress her wounds. I think it is important to have them goal for her venous reflux/history of ablations etc. to see if anything else can be done. She apparently tested positive for 1 of the blood tests with regards to lupus and saw a rheumatologist. He has raised the issue of vasculitis again. I have had this thought in the  past however the evidence seems overwhelming that this is a venous reflux etiology. If the rheumatologist tells me there is clinical and laboratory investigation is positive for lupus I will rethink this. 7/15; the patient's wound surfaces are quite a bit better. The medial area which was her original wound now has no depth although the lateral wound which was the more recent area actually appears larger. Both with viable surfaces which is indeed better. Using Sorbact. I wanted to use Apligraf on her however there is the issue of the vein and vascular appointment on 7/31 at 2:00 in the afternoon which would not allow her to get back to be rewrapped and they would no doubt remove the graft 7/22; the patient's wound surfaces have moderate amount of debris although generally look better. The lateral one is larger with 2 small satellite areas superiorly. We are waiting for her vein and vascular appointment on 7/31. She has been approved for Apligraf which I would like to use after th 7/29; wound surfaces have improved no debridement is required we have been using Sorbact. She sees vein and vascular on Friday with this so question of whether anything can be done to lessen the likelihood of recurrence and/or speed the healing of these areas. She is already had previous ablations. She no doubt has severe venous hypertension 8/5-Patient returns at 1 week, she was in [employer]  for 3 days by her podiatrist, we have been using so backed to the wound, she has increased pain in both the wounds on the left lower leg especially the more distal one on the lateral aspect 8/12-Patient returns at 1 week and she is agreeable to having debridement in both wounds on her left leg today. We have been using Sorbact, and vascular studies were reviewed at last visit 8/19; the patient arrives with her wounds fairly clean and no debridement is required. We have used Sorbact which is really done a nice job in cleaning up these  very difficult wound surfaces. The patient saw Dr. Donzetta Matters of vascular surgery on 7/31. He did not feel that there was an arterial component. He felt that her treated greater saphenous vein is adequately addressed and that the small saphenous vein did not appear to be involved significantly. She was also noted to have deep venous reflux which is not treatable. Dr. Donzetta Matters mentioned the possibility of a central obstructive component leading to reflux and he offered her central venography. She wanted to discuss this or think about it. I have urged her to go ahead with this. She has had recurrent difficult wounds in these areas which do heal but after months in the clinic. If there is anything that can be done to reduce the likelihood of this I think it is worth it. 9/2 she is still working towards getting follow-up with Dr. Donzetta Matters to schedule her CT. Things are quite a bit worse venography. I put Apligraf on 2 weeks ago on both wounds on the medial and lateral part of her left lower leg. She arrives in clinic today with 3 superficial additional wounds above the area laterally and one below the wound medially. She describes a lot of discomfort. I think these are probably wrapped injuries. Does not look like she has cellulitis. 07/20/2019 on evaluation today patient appears to be doing somewhat poorly in regard to her lower extremity ulcers. She in fact showed signs of erythema in fact we may even be dealing with an infection at this time. Unfortunately I am unsure if this is just infection or if indeed there may be some allergic reaction that occurred as a result of the Apligraf application. With that being said that would be unusual but nonetheless not impossible in this patient is one who is unfortunately allergic to quite a bit. Currently we have been using the Sorbact which seems to do as well as anything for her. I do think we may want to obtain a culture today to see if there is anything showing up there  that may need to be addressed. 9/16; noted that last week the wounds look worse in 1 week follow-up of the Apligraf. Using Sorbact as of 2 days ago. She arrives with copious amounts of drainage and new skin breakdown on the back of the left calf. The wounds arm more substantial bilaterally. There is a fair amount of swelling in the left calf no overt DVT there is edema present I think in the left greater than right thigh. She is supposed to go on 9/28 for CT venography. The wounds on the medial and lateral calf are worse and she has new skin breakdown posteriorly at least new for me. This is almost developing into a circumferential wound area The Apligraf was taken off last week which I agree with things are not going in the right direction a culture was done we do not have that back yet. She is  on Augmentin that she started 2 days ago 9/23; dressing was changed by her nurses on Monday. In general there is no improvement in the wound areas although the area looks less angry than last week. She did get Augmentin for MSSA cultured on the 14th. She still appears to have too much swelling in the left leg even with 3 layer compression 9/30; the patient underwent her procedure on 9/28 by Dr. Donzetta Matters at [facility]. She was discovered to have the common iliac vein measuring 12.2 mm but at the level of L4-L5 measured 3 mm. After stenting it measured 10 mm. 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 Rouleaux flow in the common femoral and femoral vein was observed much improved after stenting. We are using silver alginate to the wounds on the medial and lateral ankle on the left. 4 layer compression 10/7; the patient had fluid swelling around her knee and 4 layer compression. At the advice of vein and vascular this was reduced to 3 layer which she is tolerating better. We have been using silver alginate under 3 layer compression since last Friday 10/14; arrives with the areas on  the left ankle looking a lot better. Inflammation in the area also a lot better. She came in for a nurse check on PARADISE, VENSEL ([id number]) 10/9 10/21; continued nice improvement. Slight improvements in surface area of both the medial and lateral wounds on the left. A lot of the satellite lesions in the weeping erythema around these from stasis dermatitis is resolved. We have been using silver alginate 10/28; general improvement in the entire wound areas although not a lot of change in dimensions the wound certainly looks better. There is a lot less in terms of venous inflammation. Continue silver alginate this week however look towards Hydrofera Blue next week 11/4; very adherent debris on the medial wound left wound is not as bad. We have been using silver alginate. Change to [facility] today 11/11; very adherent debris on both wound areas. She went to vein and vascular last week and follow-up they put in [place] boot on this today. He says the [facility] was adherent. Wound is definitely not as good as last week. Especially on the left there the satellite lesions look more prominent 11/18; absolutely no better. erythema on lateral aspect with tenderness. 09/30/2019 on evaluation today patient appears to actually be doing better. Dr. Dellia Nims did put her on doxycycline last week which I do believe has helped her at this point. Fortunately there is no signs of active infection at this time. No fevers, chills, nausea, vomiting, or diarrhea. I do believe he may want extend the doxycycline for 7 additional days just to ensure everything does completely cleared up the patient is in agreement with that plan. Otherwise she is going require some sharp debridement today 12/2; patient is completing a 2-week course of doxycycline. I gave her this empirically for inflammation as well as infection when I last saw her 2 weeks ago. All of this seems to be better. She is using silver alginate she has the  area on the medial aspect of the larger area laterally and the 2 small satellite regions laterally above the major wound. 12/9; the patient's wound on the left medial and left lateral calf look really quite good. We have been using silver alginate. She saw vein and vascular in follow-up on 10/09/2019. She has had a previous left greater saphenous vein ablation by Dr. Oscar La in 2016.  More recently she underwent a left common iliac vein stent by Dr. 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 The swelling is improved and certainly the wounds have improved. The patient shows [place] today area on the right medial calf there is almost no wound but leaking lymphedema. She says she start this started 3 or 4 days ago. She did not traumatize it. It is not painful. She does not wear compression on that side 12/16; the patient continues to do well laterally. Medially still requiring debridement. The area on the right calf did not materialize to anything and is not currently open. We wrapped this last time. She has support stockings for that leg although I am not sure they are going to provide adequate compression 12/23; the lateral wound looks stable. Medially still requiring debridement for tightly adherent fibrinous debris. We've been using silver alginate. Surface area not any different 12/30; neither wound is any better with regards to surface and the area on the left lateral is larger. I been using silver alginate to the left lateral which look quite good last week and Sorbact to the left medial 11/11/2019. Lateral wound area actually looks better and somewhat smaller. Medial still requires a very aggressive debridement today. We have been using Sorbact on both wound areas 1/13; not much better still adherent debris bilaterally. I been using Sorbact. She has severe venous hypertension. Probably some degree of dermal fibrosis distally. I wonder whether tighter compression might help and I am going to try  that today. We also need to work on the bioburden 1/20; using Sorbact. She has severe venous hypertension status post stent placement for pelvic vein compression. We applied gentamicin last time to see if we could reduce bioburden I had some discussion with her today about the use of pentoxifylline. This is occasionally used in this setting for wounds with refractory venous insufficiency. However this interacts with Plavix. She tells me that she was put on this after stent placement for 3 months. She will call Dr. Claretha Cooper office to discuss 1/27; we are using gentamicin under Sorbact. 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 She has a stent. Wound medially is measuring smaller this week. Laterally measuring slightly larger although she has some satellite lesions superiorly 2/3; gentamicin under Sorbact under 4-layer compression. 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 She has a stent on Plavix. Her wounds are measuring smaller this week. More substantially laterally where there is a satellite lesion superiorly. 2/10; gentamicin under Sorbac. 4-layer compression. Patient communicated with Dr. Donzetta Matters at vein and vascular in [place]. He is okay with the patient coming off Plavix I will therefore start her on pentoxifylline for a 1 month trial. In general her wounds look better today. I had some concerns about swelling in the left thigh however she measures 61.5 on the right and 63 on the mid thigh which does not suggest there is any difficulty. The patient is not describing any pain. 2/17; gentamicin under Sorbac 4-layer compression. She has been on pentoxifylline for 1 week and complains of loose stool. No nausea she is eating and drinking well 2/24; the patient apparently came in 2 days ago for a nurse visit when her wrap fell down. Both areas look a little worse this week macerated medially and satellite lesions laterally. Change to silver  alginate today 3/3; wounds are larger today especially medially. She also has more swelling in her foot lower leg and I even noted some swelling in  her posterior thigh which is tender. I wonder about the patency of her stent. Fortuitously she sees Dr. Claretha Cooper group on Friday 3/10; Mrs. Broers was seen by vein and vascular on 3/5. The patient underwent ultrasound. There was no evidence of thrombosis involving the IVC no evidence of thrombosis involving the right common iliac vein there is no evidence of thrombosis involving the right external iliac vein the left external vein is also patent. The right common iliac vein stent appears patent bilateral common femoral veins are compressible and appear patent. I was concerned about the left common iliac stent however it looks like this is functional. She has some edema in the posterior thigh that was tender she still has that this week. I also note they had trouble finding the pulses in her left foot and booked her for an ABI baseline in 4 weeks. She will follow up in 6 months for repeat IVC duplex. The patient stopped the pentoxifylline because of diarrhea. It does not look like that was being effective in any case. I have advised her to go back on her aspirin 81 mg tablet, vascular it also suggested this 3/17; comes in today with her wound surfaces a lot better. The excoriations from last week considerably better probably secondary to the TCA. We have been using silver alginate 3/24; comes in today with smaller wounds both medially and laterally. Both required debridement. There are 2 small satellite areas superiorly laterally. She also has a very odd bandlike area in the mid calf almost looking like there was a weakness in the wrap in a localized area. I would write this off as being this however anteriorly she has a small raised ballotable area that is very tender almost reminiscent of an abscess but there was no obvious purulent surface to it.  02/04/20 upon evaluation today patient appears to be doing fairly well in regard to her wounds today. Fortunately there is no signs of active GENESE, QUEBEDEAUX. ([id number]) infection at this time. No fevers, chills, nausea, vomiting, or diarrhea. She has been tolerating the dressing changes without complication. Fortunately I feel like she is showing signs of improvement although has been sometime since have seen her. Nonetheless the area of concern that Dr. Dellia Nims had last week where she had possibly an area of the wrap that was we can allow the leg to bulge appears to be doing significantly better today there is no signs of anything worsening. 4/7; the patient's wounds on her medial and lateral left leg continue to contract. We have been using a regular alginate. Last week she developed an area on the right medial lower leg which is probably a venous ulcer as well. 4/14; the wounds on her left medial and lateral lower leg continue to contract. Surface eschar. We have been using regular alginate. The area on the right medial lower leg is closed. We have been putting both legs under 4-layer contraction. The patient went back to see vein and vascular she had arterial studies done which were apparently "quite good" per the patient although I have not read their notes I have never felt she had an arterial issue. 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 This is been longstanding and refractory to exercise, leg elevation and longstanding use of compression wraps in our clinic as well as compression stockings on the times we have been able to get these to heal 4/21; we thought she actually might be close this week however she arrives in  clinic with a lot of edema in her upper left calf and into her posterior thigh. This is been an intermittent problem here. She says the wrap fell down but it was replaced with a nurse visit on Monday. We are using calcium  alginate to the wounds and the wound sizes there not terribly larger than last week but there is a lot more edema 4/28; again wound edges are smaller on both sides. Her edema is better controlled than last time. She is obtained her compression pumps from [employer] although they have not been to her home to set these up. 5/5; left medial and left lateral both look stable. I am not sure the medial is any smaller. We have been using calcium alginate under 4-layer compression. oShe had an area on the right medial. This was eschared today. We have been wrapping this as well. She does not tolerate external compression stockings due to a history of various contact allergies. She has her compression pumps however the representative from the company is coming on her to show her how to use these tomorrow 5/19; patient with severe chronic venous insufficiency secondary to central venous disease. She had a stent placed in her left common iliac vein. She has done better since but still difficult to control wounds. She comes in today with nothing open on the right leg. Her areas on the left medial and left lateral are just about closed. We are using calcium alginate under 4-layer compression. She is using her external compression pumps at home She only has 15-20 support stockings. States she cannot get anything tighter than that on. 03/30/20-Patient returns at 1 week, the wounds on the left leg are both slightly bigger, the last week she was on 3 layer compression which started to slide down. She is starting to use her lymphedema pumps although she stated on 1 day her right ankle started to swell up and she have to stop that day. Unfortunately the open area seem to oscillate between improving to the point of healing and then flaring up all to do with effectiveness of compression or lack of due to the left leg topography not keeping the compression wraps from rolling down 6/2 patient comes in with a 15/20  mmHg stocking on the right leg. She tells me that she developed a lot of swelling in her ankles she saw orthopedics she was felt to possibly be having a flare of pseudogout versus some other type of arthritis. She was put on steroids for a respiratory issue so that helps with the inflammation. She has not been using the pumps all week. She thinks the left thigh is more swollen than usual and I would agree with that. She has an appointment with Dr. Donzetta Matters 9 days or so from now 6/9; both wounds on the left medial and left lateral are smaller. We have been using calcium alginate under compression. She does not have an open wound on the right leg she is using a stocking and her compression pumps things are going well. She has an appointment with Dr. Donzetta Matters with regards to her stent in the left common iliac vein 6/16; the wounds on the left medial and left lateral ankle continues to contract. The patient saw Dr. Donzetta Matters and I think he seems satisfied. Ordered follow-up venous reflux studies on both sides in September. Cautioned that she may need thigh-high stockings. She has been using calcium alginate under compression on the left and her own stocking on the right leg.  She tells [place] there are no open wounds on the right 6/23; left lateral is just about closed. Medial required debridement today. We have been using calcium alginate. Extensive discussion about the compression pumps she is only using these on 25 mmHg states she could not take 40 or 30 when the wrap came out to her home to demonstrate these. He said they should not feel tight 6/30; the left lateral wound has a slight amount of eschar. . The area medially is about the same using Hydrofera Blue. 7/7; left lateral wound still has some eschar. I will remove this next week may be closed. The area medially is very small using Hydrofera Blue with improvement. Unfortunately the stockings fell down. Unfortunately the blisters have developed at the edge of  where the wrap fell. When this happened she says her legs hurt she did not use her pumps. We are not open Monday for her to come in and change the wraps and she had an appointment yesterday. She also tells me that she is going to have an MRI of her back. She is having pain radiating into her left anterior leg she thinks her from an L5 disc. She saw Dr. Ellene Route of neurosurgery Electronic Signature(s) Signed: 05/11/2020 4:15:00 PM By: Linton Ham MD Entered By: Linton Ham on 05/11/2020 15:24:15 [name] ([id number]) -------------------------------------------------------------------------------- Physical Exam Details Patient Name: MISAKI, SOZIO. Date of Service: 05/11/2020 2:30 PM Medical Record Number: [id number] Patient Account Number: [id number] Date of Birth/Sex: 29-Sep-1946 (74 y.o. F) Treating RN: Cornell Barman Primary Care Provider: [name] Other Clinician: Referring Provider: [name] Treating Provider/Extender: Tito Dine in Treatment: 62 Constitutional Patient is hypertensive.. Pulse regular and within target range for patient.Marland Kitchen Respirations regular, non-labored and within target range.. Temperature is normal and within the target range for the patient.Marland Kitchen appears in no distress. Respiratory Respiratory effort is easy and symmetric bilaterally. Rate is normal at rest and on room air.. Cardiovascular Significant amount of and above where the wrap fell down on the left extending into the thighs.. Notes Wound exam; surface looks healthy. This is smaller than a dime today. oLaterally a small amount of eschar I did not remove this. oShe has a large blister on the medial left leg where the wrap came down to. Electronic Signature(s) Signed: 05/11/2020 4:15:00 PM By: Linton Ham MD Entered By: Linton Ham on 05/11/2020 15:25:40 Penman, [name] ([id number]) --------------------------------------------------------------------------------  Physician Orders Details Patient Name: CAPRISHA, BRIDGETT. Date of Service: 05/11/2020 2:30 PM Medical Record Number: [id number] Patient Account Number: [id number] Date of Birth/Sex: September 13, 1946 (74 y.o. F) Treating RN: Army Melia Primary Care Provider: [name] Other Clinician: Referring Provider: [name] Treating Provider/Extender: Tito Dine in Treatment: 72 Verbal / Phone Orders: No Diagnosis Coding Anesthetic (add to Medication List) Wound #5 Left,Medial Lower Leg o Topical Lidocaine 4% cream applied to wound bed prior to debridement (In Clinic Only). Wound #6 Left,Lateral Lower Leg o Topical Lidocaine 4% cream applied to wound bed prior to debridement (In Clinic Only). Primary Wound Dressing Wound #5 Left,Medial Lower Leg o Hydrafera Blue Ready Transfer Wound #6 Left,Lateral Lower Leg o Hydrafera Blue Ready Transfer Secondary Dressing Wound #5 Left,Medial Lower Leg o ABD pad Wound #6 Left,Lateral Lower Leg o ABD pad Dressing Change Frequency Wound #5 Left,Medial Lower Leg o Change dressing every week o Other: - Nurse visit Friday and Monday if needed. Wound #6 Left,Lateral Lower Leg o Change dressing every week o Other: - Nurse visit  Friday and Monday if needed. Follow-up Appointments Wound #5 Left,Medial Lower Leg o Return Appointment in 1 week. o Nurse Visit as needed Wound #6 Left,Lateral Lower Leg o Return Appointment in 1 week. o Nurse Visit as needed Edema Control Wound #5 Left,Medial Lower Leg o 4-Layer Compression System - Left Lower Extremity. o Patient to wear own compression stockings - On Right o Compression Pump: Use compression pump on left lower extremity for 60 minutes, twice daily. o Compression Pump: Use compression pump on right lower extremity for 60 minutes, twice daily. Wound #6 Left,Lateral Lower Leg o 4-Layer Compression System - Left Lower Extremity. o Patient to wear own  compression stockings - On Right o Compression Pump: Use compression pump on left lower extremity for 60 minutes, twice daily. o Compression Pump: Use compression pump on right lower extremity for 60 minutes, twice daily. Electronic Signature(s) Signed: 05/11/2020 4:15:00 PM By: Linton Ham MD KARENANN, MCGRORY ([id number]) Signed: 05/12/2020 10:39:21 AM By: Army Melia Entered By: Army Melia on 05/11/2020 15:17:04 TIAH, HECKEL ([id number]) -------------------------------------------------------------------------------- Problem List Details Patient Name: LEITHA, HYPPOLITE. Date of Service: 05/11/2020 2:30 PM Medical Record Number: [id number] Patient Account Number: [id number] Date of Birth/Sex: [birth year] (74 y.o. F) Treating RN: Cornell Barman Primary Care Provider: [name] Other Clinician: Referring Provider: [name] Treating Provider/Extender: Tito Dine in Treatment: 74 Active Problems ICD-10 Encounter Code Description Active Date MDM Diagnosis L97.221 Non-pressure chronic ulcer of left calf limited to breakdown of skin 01/07/2019 No Yes I89.0 Lymphedema, not elsewhere classified 12/10/2018 No Yes I87.332 Chronic venous hypertension (idiopathic) with ulcer and inflammation of 12/09/2019 No Yes left lower extremity I83.009 Varicose veins of unspecified lower extremity with ulcer of unspecified 04/13/2020 No Yes site I82.592 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 12/09/2019 No Yes lower extremity Inactive Problems ICD-10 Code Description Active Date Inactive Date L97.818 Non-pressure chronic ulcer of other part of right lower leg with other specified 10/14/2019 10/14/2019 severity Resolved Problems ICD-10 Code Description Active Date Resolved Date L97.211 Non-pressure chronic ulcer of right calf limited to breakdown of skin 02/10/2020 02/10/2020 Electronic Signature(s) Signed: 05/11/2020 4:15:00 PM By: Linton Ham MD Entered  By: Linton Ham on 05/11/2020 15:19:46 Letterman, [name] ([id number]) -------------------------------------------------------------------------------- Progress Note Details Patient Name: SINIYAH, EVANGELIST. Date of Service: 05/11/2020 2:30 PM Medical Record Number: [id number] Patient Account Number: [id number] Date of Birth/Sex: Nov 18, 1945 (74 y.o. F) Treating RN: Cornell Barman Primary Care Provider: [name] Other Clinician: Referring Provider: [name] Treating Provider/Extender: Tito Dine in Treatment: 76 Subjective History of Present Illness (HPI) Pleasant 74 year old with history of chronic venous insufficiency. No diabetes or peripheral vascular disease. Left ABI 1.29. Questionable history of left lower extremity DVT. She developed a recurrent ulceration on her left lateral calf in December 2015, which she attributes to poor diet and subsequent lower extremity edema. She underwent endovenous laser ablation of her left greater saphenous vein in 2010. She underwent laser ablation of accessory branch of left GSV in April 2016 by Dr. Kellie Simmering at [facility]. She was previously wearing [place] boots, which she tolerated well. Tolerating 2 layer compression and cadexomer iodine. She returns to clinic for follow-up and is without new complaints. She denies any significant pain at this time. She reports persistent pain with pressure. No claudication or ischemic rest pain. No fever or chills. No drainage. READMISSION 11/13/16; this is a 74 year old woman who is not a diabetic. She is here for a review of a  painful area on her left medial lower extremity. I note that she was seen here previously last year for wound I believe to be in the same area. At that time she had undergone previously a left greater saphenous vein ablation by Dr. Kellie Simmering and she had a ablation of the anterior accessory branch of the left greater saphenous vein in March 2016. Seeing that the wound actually  closed over. In reviewing the history with her today the ulcer in this area has been recurrent. She describes a biopsy of this area in 2009 that only showed stasis physiology. 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 Do not see that she has ever had arterial studies in the left leg. She tells me she has a follow-up with Dr. Kellie Simmering in roughly 10 days In any case she developed the reopening of this area roughly a month ago. On the background of this she describes rapidly increasing edema which has responded to Lasix 40 mg and metolazone 2.5 mg as well as the patient's lymph massage. She has been told she has both venous insufficiency and lymphedema but she cannot tolerate compression stockings 11/28/16; the patient saw Dr. Kellie Simmering recently. Per the patient he did arterial Dopplers in the office that did not show evidence of arterial insufficiency, per the patient he stated "treat this like an ordinary venous ulcer". She also saw her dermatologist Dr. Ronnald Ramp who felt that this was more of a vascular ulcer. In general things are improving although she arrives today with increasing bilateral lower extremity edema with weeping a deeper fluid through the wound on the left medial leg compatible with some degree of lymphedema 12/04/16; the patient's wound is fully epithelialized but I don't think fully healed. We will do another week of depression with Promogran and TCA however I suspect we'll be able to discharge her next week. This is a very unusual-looking wound which was initially a figure-of-eight type wound lying on its side surrounded by petechial like hemorrhage. She has had venous ablation on this side. She apparently does not have an arterial issue per Dr. Kellie Simmering. She saw her  dermatologist thought it was "vascular". Patient is definitely going to need ongoing compression and I talked about this with her today she will go to elastic therapy after she leaves here next week 12/11/16; the patient's wound is not completely closed today. She has surrounding scar tissue and in further discussion with the patient it would appear that she had ulcers in this area in 2009 for a prolonged period of time ultimately requiring a punch biopsy of this area that only showed venous insufficiency. I did not previously pickup on this part of the history from the patient. 12/18/16; the patient's wound is completely epithelialized. There is no open area here. She has significant bilateral venous insufficiency with secondary lymphedema to a mild-to-moderate degree she does not have compression stockings.. She did not say anything to me when I was in the room, she told our intake nurse that she was still having pain in this area. This isn't unusual recurrent small open area. She is going to go to elastic therapy to obtain compression stockings. 12/25/16; the patient's wound is fully epithelialized. There is no open area here. The patient describes some continued episodic discomfort in  this area medial left calf. However everything looks fine and healed here. She is been to elastic therapy and caught herself 15-20 mmHg stockings, they apparently were having trouble getting 20-30 mm stockings in her size 01/22/17; this is a patient we discharged from the clinic a month ago. She has a recurrent open wound on her medial left calf. She had 15 mm support stockings. I told her I thought she needed 20-30 mm compression stockings. She tells me that she has been ill with hospitalization secondary to asthma and is been found to have severe hypokalemia likely secondary to a combination of Lasix and metolazone. This morning she noted blistering and leaking fluid on the posterior part of her left leg. She called our  intake nurse urgently and we was saw her this afternoon. She has not had any real discomfort here. I don't know that she's been wearing any stockings on this leg for at least 2-3 days. ABIs in this clinic were 1.21 on the right and 1.3 on the left. She is previously seen vascular surgery who does not think that there is a peripheral arterial issue. 01/30/17; Patient arrives with no open wound on the left leg. She has been to elastic therapy and obtained 20-83mmhg below knee stockings and she has one on the right leg today. READMISSION 02/19/18; this Nuon is a now 74 year old patient we've had in this clinic perhaps 3 times before. I had last looked at her from January 07 December 2016 with an area on the medial left leg. We discharged her on 12/25/16 however she had to be readmitted on 01/22/17 with a recurrence. I have in my notes that we discharged her on 20-30 mm stockings although she tells me she was only wearing support hose because she cannot get stockings on predominantly related to her cervical spine surgery/issues. She has had previous ablations done by vein and vascular in [place] including a great saphenous vein ablation on the left with an anterior accessory branch ablation I think both of these were in 2016. On one of the previous visit she had a biopsy noted 2009 that was negative. She is not felt to have an arterial issue. She is not a diabetic. She does have a history of obstructive sleep apnea hypertension asthma as well as chronic venous insufficiency and lymphedema. On this occasion she noted 2 dry scaly patch on her left leg. She tried to put lotion on this it didn't really help. There were 2 open areas.the patient has been seeing her primary physician from 02/05/18 through 02/14/18. She had Unna boots applied. The superior wound now on the lateral left leg has closed but she's had one wound that remains open on the lateral left leg. This is not the same spot as we dealt with in  2018. ABIs in this clinic were 1.3 bilaterally KEAJA, REAUME ([id number]) 02/26/18; patient has a small wound on the left lateral calf. Dimensions are down. She has chronic venous insufficiency and lymphedema. 03/05/18; small open area on the left lateral calf. Dimensions are down. Tightly adherent necrotic debris over the surface of the wound which was difficult to remove. Also the dressing [over collagen] stuck to the wound surface. This was removed with some difficulty as well. Change the primary dressing to Hydrofera Blue ready 03/12/18; small open area on the left lateral calf. Comes in with tightly adherent surface eschar as well as some adherent Hydrofera Blue. 03/19/18; open area on the left lateral calf. Again adherent surface eschar as  well as some adherent Hydrofera Blue nonviable subcutaneous tissue. She complained of pain all week even with the reduction from 4-3 layer compression I put on last week. Also she had an increase in her ankle and calf measurements probably related to the same thing. 03/26/18; open area on the left lateral calf. A very small open area remains here. We used silver alginate starting last week as the Hydrofera Blue seem to stick to the wound bed. In using 4-layer compression 04/02/18; the open area in the left lateral calf at some adherent slough which I removed there is no open area here. We are able to transition her into her own compression stocking. Truthfully I think this is probably his support hose. However this does not maintain skin integrity will be limited. She cannot put over the toe compression stockings on because of neck problems hand problems etc. She is allergic to the lining layer of juxta lites. We might be forced to use extremitease stocking should this fail READMIT 11/24/2018 Patient is now a 74 year old woman who is not a diabetic. She has been in this clinic on at least 3 previous occasions largely with recurrent wounds on her left leg  secondary to chronic venous insufficiency with secondary lymphedema. Her situation is complicated by inability to get stockings on and an allergy to neoprene which is apparently a component and at least juxta lites and other stockings. As a result she really has not been wearing any stockings on her legs. She tells [place] that roughly 2 or 3 weeks ago she started noticing a stinging sensation just above her ankle on the left medial aspect. She has been diagnosed with pseudogout and she wondered whether this was what she was experiencing. She tried to dress this with something she bought at the store however subsequently it pulled skin off and now she has an open wound that is not improving. She has been using Vaseline gauze with a cover bandage. She saw her primary doctor last week who put an Haematologist on her. ABIs in this clinic was 1.03 on the left 2/12; the area is on the left medial ankle. Odd-looking wound with what looks to be surface epithelialization but a multitude of small petechial openings. This clearly not closed yet. We have been using silver alginate under 3 layer compression with TCA 2/19; the wound area did not look quite as good this week. Necrotic debris over the majority of the wound surface which required debridement. She continues to have a multitude of what looked to be small petechial openings. She reminds [place] that she had a biopsy on this initially during her first outbreak in 2015 in [place] [facility]. She expresses concern about this being a possible melanoma. 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 I assured her that this does not look anything like melanoma. Except for the petechial reaction it does look like a venous insufficiency area and she certainly has evidence of this on both sides 2/26; a difficult area on the left medial ankle. The patient clearly has chronic venous  hypertension with some degree of lymphedema. The odd thing about the area is the small petechial hemorrhages. I am not really sure how to explain this. This was present last time and this is not a compression injury. We have been using Hydrofera Blue which I changed to last week 3/4; still using Hydrofera Blue. Aggressive debridement today. She does not have  known arterial issues. She has seen Dr. Kellie Simmering at [facility] vein and vascular and and has an ablation on the left. [Anterior accessory branch of the greater saphenous]. From what I remember they did not feel she had an arterial issue. The patient has had this area biopsied in 2009 at [facility] dermatology and by her recollection they said this was "stasis". She is also follow-up with dermatology locally who thought that this was more of a vascular issue 3/11; using Hydrofera Blue. Aggressive debridement today. She does not have an arterial issue. We are using 3 layer compression although we may need to go to 4. The patient has been in for multiple changes to her wrap since I last saw her a week ago. She says that the area was leaking. I do not have too much more information on what was found 01/19/19 on evaluation today patient was actually being seen for a nurse visit when unfortunately she had the area on her left lateral lower extremity as well as weeping from the right lower extremity that became apparent. Therefore we did end up actually seeing her for a full visit with myself. She is having some pain at this site as well but fortunately nothing too significant at this point. No fevers, chills, nausea, or vomiting noted at this time. 3/18-Patient is back to the clinic with the left leg venous leg ulcer, the ulcer is larger in size, has a surface that is densely adherent with fibrinous tissue, the Hydrofera Blue was used but is densely adherent and there was difficulty in removing it. The right lower extremity was also wrapped for weeping  edema. Patient has a new area over the left lateral foot above the malleolus that is small and appears to have no debris with intact surrounding skin. Patient is on increased dose of Lasix also as a means to edema management 3/25; the patient has a nonhealing venous ulcer on the medial left leg and last week developed a smaller area on the lateral left calf. We have been using Hydrofera Blue with a contact layer. 4/1; no major change in these wounds areas. Left medial and more recently left lateral calf. I tried Iodoflex last week to aid in debridement she did not tolerate this. She stated her pain was terrible all week. She took the top layer of the 4 layer compression off. 4/8; the patient actually looks somewhat better in terms of her more prominent left lateral calf wound. There is some healthy looking tissue here. She is still complaining of a lot of discomfort. 4/15; patient in a lot of pain secondary to sciatica. She is on a prednisone taper prescribed by her primary physician. She has the 2 areas one on the left medial and more recently a smaller area on the left lateral calf. Both of these just above the malleoli 4/22; her back pain is better but she still states she is very uncomfortable and now feels she is intolerant to the [employer]. No real change in the wounds we have been using Sorbact. She has been previously intolerant to Iodoflex. There is not a lot of option about what we can use to debride this wound under compression that she no doubt needs. sHe states Ultram no longer works for her pain 4/29; no major change in the wounds slightly increased depth. Surface on the original medial wound perhaps somewhat improved however the more recent area on the lateral left ankle is 100% covered in very adherent debris we have been using  Sorbact. She tolerates 4 layer compression well and her edema control is a lot better. She has not had to come in for a nurse check 5/6; no major change in the  condition of the wounds. She did consent to debridement today which was done with some difficulty. Continuing Sorbact. She did not tolerate Iodoflex. She was in for a check of her compression the day after we wrapped her last week this was adjusted but nothing much was found 5/13; no major change in the condition or area of the wounds. I was able to get a fairly aggressive debridement done on the lateral left leg wound. Even using Sorbact under compression. She came back in on Friday to have the wrap changed. She says she felt uncomfortable on the lateral aspect of her ankle. She has a long history of chronic venous insufficiency including previous ablation surgery on this side. 5/20-Patient returns for wounds on left leg with both wounds covered in slough, with the lateral leg wound larger in size, she has been in 3 layer compression and felt more comfortable, she describes pain in ankle, in leg and pins and needles in foot, and is about to try Pamelor for this 6/3; wounds on the left lateral and left medial leg. The area medially which is the most recent of the 2 seems to have had the largest increase in [place], KIEANNA ROLLO. ([id number]) dimensions. We have been using Sorbac to try and debride the surface. She has been to see orthopedics they apparently did a plain x-ray that was indeterminant. Diagnosed her with neuropathy and they have ordered an MRI to determine if there is underlying osteomyelitis. This was not high on my thought list but I suppose it is prudent. We have advised her to make an appointment with vein and vascular in [place]. She has a history of a left greater saphenous and accessory vein ablations I wonder if there is anything else that can be done from a surgical point of view to help in these difficult refractory wounds. We have previously healed this wound on one occasion but it keeps on reopening [medial side] 6/10; deep tissue culture I did last week I think on the left medial  wound showed both moderate E. coli and moderate staph aureus [MSSA]. She is going to require antibiotics and I have chosen Augmentin. We have been using Sorbact and we have made better looking wound surface on both sides but certainly no improvement in wound area. She was back in last Friday apparently for a dressing changes the wrap was hurting her outer left ankle. She has not managed to get a hold of vein and vascular in [place]. We are going to have to make her that appointment 6/17; patient is tolerating the Augmentin. She had an MRI that I think was ordered by orthopedic surgeon this did not show osteomyelitis or an abscess did suggest cellulitis. We have been using Sorbact to the lateral and medial ankles. We have been trying to arrange a follow-up appointment with vein and vascular in [place] or did her original ablations. We apparently an area sent the request to vein and vascular in [facility] 6/24; patient has completed the Augmentin. We do not yet have a vein and vascular appointment in [place]. I am not sure what the issue is here we have asked her to call tomorrow. We are using Sorbact. Making some improvements and especially the medial wound. Both surfaces however look better medial and lateral. 7/1; the patient has been in  contact with vein and vascular in [place] but has not yet received an appointment. Using Sorbact we have gradually improve the wound surface with no improvement in surface area. She is approved for Apligraf but the wound surface still is not completely viable. She has not had to come in for a dressing change 7/8; the patient has an appointment with vein and vascular on 7/31 which is a Friday afternoon. She is concerned about getting back here for [place] to dress her wounds. I think it is important to have them goal for her venous reflux/history of ablations etc. to see if anything else can be done. She apparently tested positive for 1 of the blood tests with  regards to lupus and saw a rheumatologist. He has raised the issue of vasculitis again. I have had this thought in the past however the evidence seems overwhelming that this is a venous reflux etiology. If the rheumatologist tells me there is clinical and laboratory investigation is positive for lupus I will rethink this. 7/15; the patient's wound surfaces are quite a bit better. The medial area which was her original wound now has no depth although the lateral wound which was the more recent area actually appears larger. Both with viable surfaces which is indeed better. Using Sorbact. I wanted to use Apligraf on her however there is the issue of the vein and vascular appointment on 7/31 at 2:00 in the afternoon which would not allow her to get back to be rewrapped and they would no doubt remove the graft 7/22; the patient's wound surfaces have moderate amount of debris although generally look better. The lateral one is larger with 2 small satellite areas superiorly. We are waiting for her vein and vascular appointment on 7/31. She has been approved for Apligraf which I would like to use after th 7/29; wound surfaces have improved no debridement is required we have been using Sorbact. She sees vein and vascular on Friday with this so question of whether anything can be done to lessen the likelihood of recurrence and/or speed the healing of these areas. She is already had previous ablations. She no doubt has severe venous hypertension 8/5-Patient returns at 1 week, she was in [place] for 3 days by her podiatrist, we have been using so backed to the wound, she has increased pain in both the wounds on the left lower leg especially the more distal one on the lateral aspect 8/12-Patient returns at 1 week and she is agreeable to having debridement in both wounds on her left leg today. We have been using Sorbact, and vascular studies were reviewed at last visit 8/19; the patient arrives with her wounds  fairly clean and no debridement is required. We have used Sorbact which is really done a nice job in cleaning up these very difficult wound surfaces. The patient saw Dr. Donzetta Matters of vascular surgery on 7/31. He did not feel that there was an arterial component. He felt that her treated greater saphenous vein is adequately addressed and that the small saphenous vein did not appear to be involved significantly. She was also noted to have deep venous reflux which is not treatable. Dr. Donzetta Matters mentioned the possibility of a central obstructive component leading to reflux and he offered her central venography. She wanted to discuss this or think about it. I have urged her to go ahead with this. She has had recurrent difficult wounds in these areas which do heal but after months in the clinic. If there is anything  that can be done to reduce the likelihood of this I think it is worth it. 9/2 she is still working towards getting follow-up with Dr. Donzetta Matters to schedule her CT. Things are quite a bit worse venography. I put Apligraf on 2 weeks ago on both wounds on the medial and lateral part of her left lower leg. She arrives in clinic today with 3 superficial additional wounds above the area laterally and one below the wound medially. She describes a lot of discomfort. I think these are probably wrapped injuries. Does not look like she has cellulitis. 07/20/2019 on evaluation today patient appears to be doing somewhat poorly in regard to her lower extremity ulcers. She in fact showed signs of erythema in fact we may even be dealing with an infection at this time. Unfortunately I am unsure if this is just infection or if indeed there may be some allergic reaction that occurred as a result of the Apligraf application. With that being said that would be unusual but nonetheless not impossible in this patient is one who is unfortunately allergic to quite a bit. Currently we have been using the Sorbact which seems to do as well  as anything for her. I do think we may want to obtain a culture today to see if there is anything showing up there that may need to be addressed. 9/16; noted that last week the wounds look worse in 1 week follow-up of the Apligraf. Using Sorbact as of 2 days ago. She arrives with copious amounts of drainage and new skin breakdown on the back of the left calf. The wounds arm more substantial bilaterally. There is a fair amount of swelling in the left calf no overt DVT there is edema present I think in the left greater than right thigh. She is supposed to go on 9/28 for CT venography. The wounds on the medial and lateral calf are worse and she has new skin breakdown posteriorly at least new for me. This is almost developing into a circumferential wound area The Apligraf was taken off last week which I agree with things are not going in the right direction a culture was done we do not have that back yet. She is on Augmentin that she started 2 days ago 9/23; dressing was changed by her nurses on Monday. In general there is no improvement in the wound areas although the area looks less angry than last week. She did get Augmentin for MSSA cultured on the 14th. She still appears to have too much swelling in the left leg even with 3 layer compression 9/30; the patient underwent her procedure on 9/28 by Dr. Donzetta Matters at vascular and vein specialist. She was discovered to have the common iliac vein measuring 12.2 mm but at the level of L4-L5 measured 3 mm. After stenting it measured 10 mm. 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 Rouleaux flow in the common femoral and femoral vein was observed much improved after stenting. We are using silver alginate to the wounds on the medial and lateral ankle on the left. 4 layer compression 10/7; the patient had fluid swelling around her knee and 4 layer compression. At the advice of vein and vascular this was reduced to 3 layer which she is tolerating  better. We have been using silver alginate under 3 layer compression since last Friday 10/14; arrives with the areas on the left ankle looking a lot better. Inflammation in the area also a lot better. She came in  for a nurse check on 10/9 10/21; continued nice improvement. Slight improvements in surface area of both the medial and lateral wounds on the left. A lot of the satellite lesions in the weeping erythema around these from stasis dermatitis is resolved. We have been using silver alginate IEISHA, GAO ([id number]) 10/28; general improvement in the entire wound areas although not a lot of change in dimensions the wound certainly looks better. There is a lot less in terms of venous inflammation. Continue silver alginate this week however look towards Hydrofera Blue next week 11/4; very adherent debris on the medial wound left wound is not as bad. We have been using silver alginate. Change to [name] today 11/11; very adherent debris on both wound areas. She went to vein and vascular last week and follow-up they put in [place] boot on this today. He says the [facility] was adherent. Wound is definitely not as good as last week. Especially on the left there the satellite lesions look more prominent 11/18; absolutely no better. erythema on lateral aspect with tenderness. 09/30/2019 on evaluation today patient appears to actually be doing better. Dr. Dellia Nims did put her on doxycycline last week which I do believe has helped her at this point. Fortunately there is no signs of active infection at this time. No fevers, chills, nausea, vomiting, or diarrhea. I do believe he may want extend the doxycycline for 7 additional days just to ensure everything does completely cleared up the patient is in agreement with that plan. Otherwise she is going require some sharp debridement today 12/2; patient is completing a 2-week course of doxycycline. I gave her this empirically for inflammation as well  as infection when I last saw her 2 weeks ago. All of this seems to be better. She is using silver alginate she has the area on the medial aspect of the larger area laterally and the 2 small satellite regions laterally above the major wound. 12/9; the patient's wound on the left medial and left lateral calf look really quite good. We have been using silver alginate. She saw vein and vascular in follow-up on 10/09/2019. She has had a previous left greater saphenous vein ablation by Dr. Oscar La in 2016. More recently she underwent a left common iliac vein stent by Dr. 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 The swelling is improved and certainly the wounds have improved. The patient shows [place] today area on the right medial calf there is almost no wound but leaking lymphedema. She says she start this started 3 or 4 days ago. She did not traumatize it. It is not painful. She does not wear compression on that side 12/16; the patient continues to do well laterally. Medially still requiring debridement. The area on the right calf did not materialize to anything and is not currently open. We wrapped this last time. She has support stockings for that leg although I am not sure they are going to provide adequate compression 12/23; the lateral wound looks stable. Medially still requiring debridement for tightly adherent fibrinous debris. We've been using silver alginate. Surface area not any different 12/30; neither wound is any better with regards to surface and the area on the left lateral is larger. I been using silver alginate to the left lateral which look quite good last week and Sorbact to the left medial 11/11/2019. Lateral wound area actually looks better and somewhat smaller. Medial still requires a very aggressive debridement today. We have been using Sorbact on both  wound areas 1/13; not much better still adherent debris bilaterally. I been using Sorbact. She has severe venous hypertension.  Probably some degree of dermal fibrosis distally. I wonder whether tighter compression might help and I am going to try that today. We also need to work on the bioburden 1/20; using Sorbact. She has severe venous hypertension status post stent placement for pelvic vein compression. We applied gentamicin last time to see if we could reduce bioburden I had some discussion with her today about the use of pentoxifylline. This is occasionally used in this setting for wounds with refractory venous insufficiency. However this interacts with Plavix. She tells me that she was put on this after stent placement for 3 months. She will call Dr. Claretha Cooper office to discuss 1/27; we are using gentamicin under Sorbact. 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 She has a stent. Wound medially is measuring smaller this week. Laterally measuring slightly larger although she has some satellite lesions superiorly 2/3; gentamicin under Sorbact under 4-layer compression. 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 She has a stent on Plavix. Her wounds are measuring smaller this week. More substantially laterally where there is a satellite lesion superiorly. 2/10; gentamicin under Sorbac. 4-layer compression. Patient communicated with Dr. Donzetta Matters at [facility] vascular in [place]. He is okay with the patient coming off Plavix I will therefore start her on pentoxifylline for a 1 month trial. In general her wounds look better today. I had some concerns about swelling in the left thigh however she measures 61.5 on the right and 63 on the mid thigh which does not suggest there is any difficulty. The patient is not describing any pain. 2/17; gentamicin under Sorbac 4-layer compression. She has been on pentoxifylline for 1 week and complains of loose stool. No nausea she is eating and drinking well 2/24; the patient apparently came in 2 days ago for a nurse visit when her wrap fell  down. Both areas look a little worse this week macerated medially and satellite lesions laterally. Change to silver alginate today 3/3; wounds are larger today especially medially. She also has more swelling in her foot lower leg and I even noted some swelling in her posterior thigh which is tender. I wonder about the patency of her stent. Fortuitously she sees Dr. Claretha Cooper group on Friday 3/10; Mrs. Maragh was seen by vein and vascular on 3/5. The patient underwent ultrasound. There was no evidence of thrombosis involving the IVC no evidence of thrombosis involving the right common iliac vein there is no evidence of thrombosis involving the right external iliac vein the left external vein is also patent. The right common iliac vein stent appears patent bilateral common femoral veins are compressible and appear patent. I was concerned about the left common iliac stent however it looks like this is functional. She has some edema in the posterior thigh that was tender she still has that this week. I also note they had trouble finding the pulses in her left foot and booked her for an ABI baseline in 4 weeks. She will follow up in 6 months for repeat IVC duplex. The patient stopped the pentoxifylline because of diarrhea. It does not look like that was being effective in any case. I have advised her to go back on her aspirin 81 mg tablet, vascular it also suggested this 3/17; comes in today with her wound surfaces a lot better. The excoriations from last week considerably better probably secondary to the TCA.  We have been using silver alginate 3/24; comes in today with smaller wounds both medially and laterally. Both required debridement. There are 2 small satellite areas superiorly laterally. She also has a very odd bandlike area in the mid calf almost looking like there was a weakness in the wrap in a localized area. I would write this off as being this however anteriorly she has a small raised  ballotable area that is very tender almost reminiscent of an abscess but there was no obvious purulent surface to it. 02/04/20 upon evaluation today patient appears to be doing fairly well in regard to her wounds today. Fortunately there is no signs of active infection at this time. No fevers, chills, nausea, vomiting, or diarrhea. She has been tolerating the dressing changes without complication. Fortunately I feel like she is showing signs of improvement although has been sometime since have seen her. Nonetheless the area of concern that Dr. Dellia Nims had last week where she had possibly an area of the wrap that was we can allow the leg to bulge appears to be doing significantly better today there is no signs of anything worsening. MIRELY, PANGLE ([id number]) 4/7; the patient's wounds on her medial and lateral left leg continue to contract. We have been using a regular alginate. Last week she developed an area on the right medial lower leg which is probably a venous ulcer as well. 4/14; the wounds on her left medial and lateral lower leg continue to contract. Surface eschar. We have been using regular alginate. The area on the right medial lower leg is closed. We have been putting both legs under 4-layer contraction. The patient went back to see vein and vascular she had arterial studies done which were apparently "quite good" per the patient although I have not read their notes I have never felt she had an arterial issue. 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 This is been longstanding and refractory to exercise, leg elevation and longstanding use of compression wraps in our clinic as well as compression stockings on the times we have been able to get these to heal 4/21; we thought she actually might be close this week however she arrives in clinic with a lot of edema in her upper left calf and into her posterior thigh. This is been an intermittent  problem here. She says the wrap fell down but it was replaced with a nurse visit on Monday. We are using calcium alginate to the wounds and the wound sizes there not terribly larger than last week but there is a lot more edema 4/28; again wound edges are smaller on both sides. Her edema is better controlled than last time. She is obtained her compression pumps from [employer] although they have not been to her home to set these up. 5/5; left medial and left lateral both look stable. I am not sure the medial is any smaller. We have been using calcium alginate under 4-layer compression. She had an area on the right medial. This was eschared today. We have been wrapping this as well. She does not tolerate external compression stockings due to a history of various contact allergies. She has her compression pumps however the representative from the company is coming on her to show her how to use these tomorrow 5/19; patient with severe chronic venous insufficiency secondary to central venous disease. She had a stent placed in her left common iliac vein. She has done better since but still difficult to  control wounds. She comes in today with nothing open on the right leg. Her areas on the left medial and left lateral are just about closed. We are using calcium alginate under 4-layer compression. She is using her external compression pumps at home She only has 15-20 support stockings. States she cannot get anything tighter than that on. 03/30/20-Patient returns at 1 week, the wounds on the left leg are both slightly bigger, the last week she was on 3 layer compression which started to slide down. She is starting to use her lymphedema pumps although she stated on 1 day her right ankle started to swell up and she have to stop that day. Unfortunately the open area seem to oscillate between improving to the point of healing and then flaring up all to do with effectiveness of compression or lack of due  to the left leg topography not keeping the compression wraps from rolling down 6/2 patient comes in with a 15/20 mmHg stocking on the right leg. She tells me that she developed a lot of swelling in her ankles she saw orthopedics she was felt to possibly be having a flare of pseudogout versus some other type of arthritis. She was put on steroids for a respiratory issue so that helps with the inflammation. She has not been using the pumps all week. She thinks the left thigh is more swollen than usual and I would agree with that. She has an appointment with Dr. Donzetta Matters 9 days or so from now 6/9; both wounds on the left medial and left lateral are smaller. We have been using calcium alginate under compression. She does not have an open wound on the right leg she is using a stocking and her compression pumps things are going well. She has an appointment with Dr. Donzetta Matters with regards to her stent in the left common iliac vein 6/16; the wounds on the left medial and left lateral ankle continues to contract. The patient saw Dr. Donzetta Matters and I think he seems satisfied. Ordered follow-up venous reflux studies on both sides in September. Cautioned that she may need thigh-high stockings. She has been using calcium alginate under compression on the left and her own stocking on the right leg. She tells [place] there are no open wounds on the right 6/23; left lateral is just about closed. Medial required debridement today. We have been using calcium alginate. Extensive discussion about the compression pumps she is only using these on 25 mmHg states she could not take 40 or 30 when the wrap came out to her home to demonstrate these. He said they should not feel tight 6/30; the left lateral wound has a slight amount of eschar. . The area medially is about the same using Hydrofera Blue. 7/7; left lateral wound still has some eschar. I will remove this next week may be closed. The area medially is very small using Hydrofera Blue with  improvement. Unfortunately the stockings fell down. Unfortunately the blisters have developed at the edge of where the wrap fell. When this happened she says her legs hurt she did not use her pumps. We are not open Monday for her to come in and change the wraps and she had an appointment yesterday. She also tells me that she is going to have an MRI of her back. She is having pain radiating into her left anterior leg she thinks her from an L5 disc. She saw Dr. Ellene Route of neurosurgery Objective Constitutional Patient is hypertensive.. Pulse regular and within target range  for patient.Marland Kitchen Respirations regular, non-labored and within target range.. Temperature is normal and within the target range for the patient.Marland Kitchen appears in no distress. Vitals Time Taken: 2:40 PM, Height: 63 in, Weight: 224.7 lbs, BMI: 39.8, Temperature: 98.2 F, Pulse: 78 bpm, Respiratory Rate: 18 breaths/min, Blood Pressure: 153/71 mmHg. Respiratory Respiratory effort is easy and symmetric bilaterally. Rate is normal at rest and on room air.. Cardiovascular Significant amount of and above where the wrap fell down on the left extending into the thighs.Marland Kitchen REWA, WEISSBERG ([id number]) General Notes: Wound exam; surface looks healthy. This is smaller than a dime today. Laterally a small amount of eschar I did not remove this. She has a large blister on the medial left leg where the wrap came down to. Integumentary (Hair, Skin) Wound #5 status is Open. Original cause of wound was Gradually Appeared. The wound is located on the Left,Medial Lower Leg. The wound measures 1.5cm length x 1.7cm width x 0.1cm depth; 2.003cm^2 area and 0.2cm^3 volume. There is Fat Layer (Subcutaneous Tissue) Exposed exposed. There is a medium amount of serosanguineous drainage noted. The wound margin is flat and intact. There is medium (34-66%) red granulation within the wound bed. There is a medium (34-66%) amount of necrotic tissue within the wound bed  including Adherent Slough. Wound #6 status is Open. Original cause of wound was Gradually Appeared. The wound is located on the Left,Lateral Lower Leg. The wound measures 0.1cm length x 0.1cm width x 0.1cm depth; 0.008cm^2 area and 0.001cm^3 volume. There is a none present amount of drainage noted. There is no granulation within the wound bed. There is a large (67-100%) amount of necrotic tissue within the wound bed including Eschar. 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 There was a Four Layer Compression Therapy Procedure by Army Melia, RN. Post procedure Diagnosis Wound #5: Same as Pre-Procedure Wound #6 Pre-procedure diagnosis of Wound #6 is a Venous Leg Ulcer located on the Left,Lateral Lower Leg . There was a Four Layer Compression Therapy Procedure by Army Melia, RN. Post procedure Diagnosis Wound #6: Same as Pre-Procedure Plan Anesthetic (add to Medication List): Wound #5 Left,Medial Lower Leg: Topical Lidocaine 4% cream applied to wound bed prior to debridement (In Clinic Only). Wound #6 Left,Lateral Lower Leg: Topical Lidocaine 4% cream applied to wound bed prior to debridement (In Clinic Only). Primary Wound Dressing: Wound #5 Left,Medial Lower Leg: Hydrafera Blue Ready Transfer Wound #6 Left,Lateral Lower Leg: Hydrafera Blue Ready Transfer Secondary Dressing: Wound #5 Left,Medial Lower Leg: ABD pad Wound #6 Left,Lateral Lower Leg: ABD pad Dressing Change Frequency: Wound #5 Left,Medial Lower Leg: Change  dressing every week Other: - Nurse visit Friday and Monday if needed. Wound #6 Left,Lateral Lower Leg: Change dressing every week KOURTLYNN, TREVOR ([id number]) Other: - Nurse visit Friday and Monday if needed. Follow-up Appointments: Wound #5 Left,Medial Lower Leg: Return Appointment in 1 week. Nurse Visit as needed Wound #6 Left,Lateral Lower Leg: Return Appointment in 1 week. Nurse Visit as needed Edema Control: Wound #5 Left,Medial Lower Leg: 4-Layer Compression System - Left Lower Extremity. Patient to wear own compression stockings - On  Right Compression Pump: Use compression pump on left lower extremity for 60 minutes, twice daily. Compression Pump: Use compression pump on right lower extremity for 60 minutes, twice daily. Wound #6 Left,Lateral Lower Leg: 4-Layer Compression System - Left Lower Extremity. Patient to wear own compression stockings - On Right Compression Pump: Use compression pump on left lower extremity for 60 minutes, twice daily. Compression Pump: Use compression pump on right lower extremity for 60 minutes, twice daily. 1. Continue with Hydrofera Blue to the wound medially and laterally 2. I am going to remove the eschar laterally next time she is here this may be healed underneath 3. We wrapped her leg back up above the level of the blister but the one medially especially is going to need to be checked next week 4. I have asked her to go back into her compression pumps. The amount of edema that she accumulates when she does not use these is really impressive. Electronic Signature(s) Signed: 05/11/2020 4:15:00 PM By: Linton Ham MD Entered By: Linton Ham on 05/11/2020 15:27:00 [name], [name] ([id number]) -------------------------------------------------------------------------------- SuperBill Details Patient Name: DARSHA, ZUMSTEIN. Date of Service: 05/11/2020 Medical Record Number: [id number] Patient Account Number: [id number] Date of Birth/Sex:  [birth year] (74 y.o. 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 Facility Procedures CPT4 Code: [id number] Description: (Facility Use Only) [id number] - [place] LWR LT LEG Modifier: Quantity: 1 Physician Procedures CPT4 Code Description: [id number] - WC PHYS LEVEL 3 - EST PT Modifier: Quantity: 1 CPT4 Code Description: ICD-10 Diagnosis Description L97.221 Non-pressure chronic ulcer of left calf limited to breakdown of skin I89.0 Lymphedema, not elsewhere classified I87.332 Chronic venous hypertension (idiopathic) with ulcer and inflammation of Modifier: left lower extremity Quantity: Electronic Signature(s) Signed: 05/11/2020 4:15:00 PM By: Linton Ham MD Entered By: Linton Ham on 05/11/2020 15:27:30

## 2020-05-12 NOTE — Progress Notes (Signed)
LAUREEN, FREDERIC ([id number]) Visit Report for 05/11/2020 Arrival Information Details Patient Name: [name], [name]. Date of Service: 05/11/2020 2:30 PM Medical Record Number: [id number] Patient Account Number: [id number] Date of Birth/Sex: 07/15/1946 (74 y.o. F) Treating RN: Cornell Barman Primary Care Skyelar Swigart: [name] Other Clinician: Referring Meshelle Holness: [name] Treating Adonia Porada/Extender: Tito Dine in Treatment: 33 Visit Information History Since Last Visit Added or deleted any medications: No Patient Arrived: Ambulatory Any new allergies or adverse reactions: No Arrival Time: 14:43 Had a fall or experienced change in No Accompanied By: self activities of daily living that may affect Transfer Assistance: None risk of falls: Patient Identification Verified: Yes Signs or symptoms of abuse/neglect since last visito No Secondary Verification Process Completed: Yes Hospitalized since last visit: No Patient Requires Transmission-Based No Implantable device outside of the clinic excluding No Precautions: cellular tissue based products placed in the center Patient Has Alerts: Yes since last visit: Patient Alerts: Patient on Blood Has Dressing in Place as Prescribed: Yes Thinner Has Compression in Place as Prescribed: Yes aspirin 81 Pain Present Now: No Electronic Signature(s) Signed: 05/11/2020 4:31:07 PM By: Lorine Bears RCP, RRT, CHT Entered By: Lorine Bears on 05/11/2020 14:43:54 Kress, [name] ([id number]) -------------------------------------------------------------------------------- Compression Therapy Details Patient Name: [name], [name]. Date of Service: 05/11/2020 2:30 PM Medical Record Number: [id number] Patient Account Number: [id number] Date of Birth/Sex: 03-25-1946 (74 y.o. F) Treating RN: Army Melia Primary Care Ramia Sidney: [name] Other Clinician: Referring Jessika Rothery: [name] Treating  Brianda Beitler/Extender: Tito Dine in Treatment: 74 Compression Therapy Performed for Wound Assessment: Wound #6 Left,Lateral Lower Leg Performed By: Clinician Army Melia, RN Compression Type: Four Layer Post Procedure Diagnosis Same as Pre-procedure Electronic Signature(s) Signed: 05/12/2020 10:39:21 AM By: Army Melia Entered By: Army Melia on 05/11/2020 15:16:20 Sarwar, [name] ([id number]) -------------------------------------------------------------------------------- Compression Therapy Details Patient Name: [name], [name]. Date of Service: 05/11/2020 2:30 PM Medical Record Number: [id number] Patient Account Number: [id number] Date of Birth/Sex: 1946-06-23 (74 y.o. F) Treating RN: Army Melia Primary Care Keston Seever: [name] Other Clinician: Referring Tiya Schrupp: [name] Treating Neriah Brott/Extender: Tito Dine in Treatment: 74 Compression Therapy Performed for Wound Assessment: Wound #5 Left,Medial Lower Leg Performed By: Clinician Army Melia, RN Compression Type: Four Layer Post Procedure Diagnosis Same as Pre-procedure Electronic Signature(s) Signed: 05/12/2020 10:39:21 AM By: Army Melia Entered By: Army Melia on 05/11/2020 15:16:21 Peine, [name] ([id number]) -------------------------------------------------------------------------------- Encounter Discharge Information Details Patient Name: [name], [name]. Date of Service: 05/11/2020 2:30 PM Medical Record Number: [id number] Patient Account Number: [id number] Date of Birth/Sex: 09-10-1946 (74 y.o. F) Treating RN: Army Melia Primary Care Theodoros Stjames: [name] Other Clinician: Referring Kadee Philyaw: [name] Treating Tyree Vandruff/Extender: Tito Dine in Treatment: 60 Encounter Discharge Information Items Discharge Condition: Stable Ambulatory Status: Ambulatory Discharge Destination: Home Transportation: Private Auto Accompanied By: self Schedule  Follow-up Appointment: Yes Clinical Summary of Care: Electronic Signature(s) Signed: 05/12/2020 10:39:21 AM By: Army Melia Entered By: Army Melia on 05/11/2020 15:18:40 Brunell, [name] ([id number]) -------------------------------------------------------------------------------- Lower Extremity Assessment Details Patient Name: [name], [name]. Date of Service: 05/11/2020 2:30 PM Medical Record Number: [id number] Patient Account Number: [id number] Date of Birth/Sex: 25-Apr-1946 (74 y.o. F) Treating RN: Army Melia Primary Care Nathalee Smarr: [name] Other Clinician: Referring Madalyne Husk: [name] Treating Bula Cavalieri/Extender: Tito Dine in Treatment: 74 Edema Assessment Assessed: [Left: No] [Right: No] Edema: [Left: Ye] [Right: s] Calf Left: Right: Point of Measurement: 33 cm From Medial Instep 53 cm cm Ankle  Left: Right: Point of Measurement: 10 cm From Medial Instep 23 cm cm Vascular Assessment Pulses: Dorsalis Pedis Palpable: [Left:Yes] Electronic Signature(s) Signed: 05/12/2020 10:39:21 AM By: Army Melia Entered By: Army Melia on 05/11/2020 14:56:56 Dunton, [name] ([id number]) -------------------------------------------------------------------------------- Multi Wound Chart Details Patient Name: [name], [name]. Date of Service: 05/11/2020 2:30 PM Medical Record Number: [id number] Patient Account Number: [id number] Date of Birth/Sex: 1946-03-03 (74 y.o. F) Treating RN: Army Melia Primary Care Elsie Sakuma: [name] Other Clinician: Referring Ina Poupard: [name] Treating Shamonica Schadt/Extender: Tito Dine in Treatment: 41 Vital Signs Height(in): 63 Pulse(bpm): 83 Weight(lbs): 224.7 Blood Pressure(mmHg): 153/71 Body Mass Index(BMI): 40 Temperature(F): 98.2 Respiratory Rate(breaths/min): 18 Photos: [N/A:N/A] Wound Location: Left, Medial Lower Leg Left, Lateral Lower Leg N/A Wounding Event: Gradually Appeared Gradually  Appeared N/A Primary Etiology: Lymphedema Venous Leg Ulcer N/A Comorbid History: Cataracts, Asthma, Sleep Apnea, Cataracts, Asthma, Sleep Apnea, N/A Deep Vein Thrombosis, Deep Vein Thrombosis, Hypertension, Peripheral Venous Hypertension, Peripheral Venous Disease, Osteoarthritis, Received Disease, Osteoarthritis, Received Chemotherapy, Received Radiation Chemotherapy, Received Radiation Date Acquired: 11/19/2018 01/19/2019 N/A Weeks of Treatment: 74 68 N/A Wound Status: Open Open N/A Measurements L x W x D (cm) 1.5x1.7x0.1 0.1x0.1x0.1 N/A Area (cm) : 2.003 0.008 N/A Volume (cm) : 0.2 0.001 N/A % Reduction in Area: 76.60% 96.40% N/A % Reduction in Volume: 76.60% 95.50% N/A Classification: Full Thickness Without Exposed Full Thickness Without Exposed N/A Support Structures Support Structures Exudate Amount: Medium None Present N/A Exudate Type: Serosanguineous N/A N/A Exudate Color: red, brown N/A N/A Wound Margin: Flat and Intact N/A N/A Granulation Amount: Medium (34-66%) None Present (0%) N/A Granulation Quality: Red N/A N/A Necrotic Amount: Medium (34-66%) Large (67-100%) N/A Necrotic Tissue: Adherent Slough Eschar N/A Exposed Structures: Fat Layer (Subcutaneous Tissue) Fascia: No N/A Exposed: Yes Fat Layer (Subcutaneous Tissue) Fascia: No Exposed: No Tendon: No Tendon: No Muscle: No Muscle: No Joint: No Joint: No Bone: No Bone: No Epithelialization: None Medium (34-66%) N/A Procedures Performed: Compression Therapy Compression Therapy N/A Treatment Notes Wound #5 (Left, Medial Lower Leg) Notes Oo, Sabra J. ([id number]) adaptic, h. blue, ABD, 4 layer Wound #6 (Left, Lateral Lower Leg) Notes adaptic, h. blue, ABD, 4 layer Electronic Signature(s) Signed: 05/11/2020 4:15:00 PM By: Linton Ham MD Entered By: Linton Ham on 05/11/2020 15:19:56 Demarinis, [name]  ([id number]) -------------------------------------------------------------------------------- Multi-Disciplinary Care Plan Details Patient Name: [name], [name]. Date of Service: 05/11/2020 2:30 PM Medical Record Number: [id number] Patient Account Number: [id number] Date of Birth/Sex: 03-Jul-1946 (75 y.o. F) Treating RN: Army Melia Primary Care Jequan Shahin: [name] Other Clinician: Referring Naim Murtha: [name] Treating Aolanis Crispen/Extender: Tito Dine in Treatment: 28 Active Inactive Medication Nursing Diagnoses: Knowledge deficit related to medication safety: actual or potential Goals: Patient/caregiver will demonstrate understanding of new oral/IV medications prescribed at the [facility] (topical prescriptions are covered under the skin breakdown problem) Date Initiated: 12/16/2019 Target Resolution Date: 01/13/2020 Goal Status: Active Interventions: Assess for medication contraindications each visit where new medications are prescribed Treatment Activities: New medication prescribed at [facility] : 12/16/2019 Notes: Soft Tissue Infection Nursing Diagnoses: Impaired tissue integrity Goals: Patient's soft tissue infection will resolve Date Initiated: 12/10/2018 Target Resolution Date: 01/09/2019 Goal Status: Active Interventions: Assess signs and symptoms of infection every visit Notes: Venous Leg Ulcer Nursing Diagnoses: Actual venous Insuffiency (use after diagnosis is confirmed) Goals: Patient will maintain optimal edema control Date Initiated: 12/10/2018 Target Resolution Date: 01/09/2019 Goal Status: Active Interventions: Assess peripheral edema status every visit. Treatment Activities: Therapeutic compression applied : 12/10/2018 Notes: Wound/Skin  Impairment Nursing Diagnoses: MADDYX, VALLIE ([id number]) Impaired tissue integrity Goals: Patient/caregiver will verbalize understanding of skin care regimen Date Initiated: 12/10/2018 Target Resolution  Date: 01/09/2019 Goal Status: Active Interventions: Assess ulceration(s) every visit Treatment Activities: Topical wound management initiated : 12/10/2018 Notes: Electronic Signature(s) Signed: 05/12/2020 10:39:21 AM By: Army Melia Entered By: Army Melia on 05/11/2020 15:15:04 Sommerfeld, [name] ([id number]) -------------------------------------------------------------------------------- Pain Assessment Details Patient Name: KANAI, HILGER. Date of Service: 05/11/2020 2:30 PM Medical Record Number: [id number] Patient Account Number: [id number] Date of Birth/Sex: 1946/05/27 (74 y.o. F) Treating RN: Army Melia Primary Care Rashema [name]: [name] Other Clinician: Referring Kyrel [name]: [name] Treating Roneshia Drew/Extender: Tito Dine in Treatment: 42 Active Problems Location of Pain Severity and Description of Pain Patient Has Paino No Site Locations Pain Management and Medication Current Pain Management: Electronic Signature(s) Signed: 05/12/2020 10:39:21 AM By: Army Melia Entered By: Army Melia on 05/11/2020 14:52:17 Bagot, [name] ([id number]) -------------------------------------------------------------------------------- Patient/Caregiver Education Details Patient Name: [name], [name]. Date of Service: 05/11/2020 2:30 PM Medical Record Number: [id number] Patient Account Number: [id number] Date of Birth/Gender: Feb 20, 1946 (74 y.o. F) Treating RN: Army Melia Primary Care Physician: [name] Other Clinician: Referring Physician: [name] Treating Physician/Extender: Tito Dine in Treatment: 28 Education Assessment Education Provided To: Patient Education Topics Provided Wound/Skin Impairment: Handouts: Caring for Your Ulcer Methods: Demonstration, Explain/Verbal Responses: State content correctly Electronic Signature(s) Signed: 05/12/2020 10:39:21 AM By: Army Melia Entered By: Army Melia on 05/11/2020  15:17:49 Fosnaugh, [name] ([id number]) -------------------------------------------------------------------------------- Wound Assessment Details Patient Name: [name], WARR. Date of Service: 05/11/2020 2:30 PM Medical Record Number: [id number] Patient Account Number: [id number] Date of Birth/Sex: [birth year] (74 y.o. F) Treating RN: Army Melia Primary Care Melea Prezioso: [name] Other Clinician: Referring [name] [name]: [name] Treating Ilhan [name]/Extender: Tito Dine in Treatment: 74 Wound Status Wound Number: 5 Primary Lymphedema Etiology: Wound Location: Left, Medial Lower Leg Wound Open Wounding Event: Gradually Appeared Status: Date Acquired: 11/19/2018 Comorbid Cataracts, Asthma, Sleep Apnea, Deep Vein Thrombosis, Weeks Of Treatment: 74 History: Hypertension, Peripheral Venous Disease, Osteoarthritis, Clustered Wound: No Received Chemotherapy, Received Radiation Photos Wound Measurements Length: (cm) 1.5 Width: (cm) 1.7 Depth: (cm) 0.1 Area: (cm) 2.003 Volume: (cm) 0.2 % Reduction in Area: 76.6% % Reduction in Volume: 76.6% Epithelialization: None Wound Description Classification: Full Thickness Without Exposed Support Struct Wound Margin: Flat and Intact Exudate Amount: Medium Exudate Type: Serosanguineous Exudate Color: red, brown ures Foul Odor After Cleansing: No Slough/Fibrino Yes Wound Bed Granulation Amount: Medium (34-66%) Exposed Structure Granulation Quality: Red Fascia Exposed: No Necrotic Amount: Medium (34-66%) Fat Layer (Subcutaneous Tissue) Exposed: Yes Necrotic Quality: Adherent Slough Tendon Exposed: No Muscle Exposed: No Joint Exposed: No Bone Exposed: No Treatment Notes Wound #5 (Left, Medial Lower Leg) Notes adaptic, h. blue, ABD, 4 layer Electronic Signature(s) [name], [name] ([id number]) Signed: 05/12/2020 10:39:21 AM By: Army Melia Entered By: Army Melia on 05/11/2020 14:54:54 Wilsey, [name]  ([id number]) -------------------------------------------------------------------------------- Wound Assessment Details Patient Name: [name], HAYASHIDA. Date of Service: 05/11/2020 2:30 PM Medical Record Number: [id number] Patient Account Number: [id number] Date of Birth/Sex: 1946-10-23 (74 y.o. F) Treating RN: Army Melia Primary Care Trysten Berti: [name] Other Clinician: Referring Abigial Newville: [name] Treating Alzora Ha/Extender: Tito Dine in Treatment: 74 Wound Status Wound Number: 6 Primary Venous Leg Ulcer Etiology: Wound Location: Left, Lateral Lower Leg Wound Open Wounding Event: Gradually Appeared Status: Date Acquired: 01/19/2019 Comorbid Cataracts, Asthma, Sleep Apnea, Deep Vein Thrombosis, Weeks Of Treatment: 68 History: Hypertension, Peripheral Venous  Disease, Osteoarthritis, Clustered Wound: No Received Chemotherapy, Received Radiation Photos Wound Measurements Length: (cm) 0.1 Width: (cm) 0.1 Depth: (cm) 0.1 Area: (cm) 0.008 Volume: (cm) 0.001 % Reduction in Area: 96.4% % Reduction in Volume: 95.5% Epithelialization: Medium (34-66%) Wound Description Classification: Full Thickness Without Exposed Support Structures Exudate Amount: None Present Foul Odor After Cleansing: No Slough/Fibrino Yes Wound Bed Granulation Amount: None Present (0%) Exposed Structure Necrotic Amount: Large (67-100%) Fascia Exposed: No Necrotic Quality: Eschar Fat Layer (Subcutaneous Tissue) Exposed: No Tendon Exposed: No Muscle Exposed: No Joint Exposed: No Bone Exposed: No Treatment Notes Wound #6 (Left, Lateral Lower Leg) Notes adaptic, h. blue, ABD, 4 layer Electronic Signature(s) Signed: 05/12/2020 10:39:21 AM By: Army Melia Entered By: Army Melia on 05/11/2020 14:55:49 Gombos, [name] ([id number]) Beining, [name] ([id number]) -------------------------------------------------------------------------------- Vitals Details Patient Name: LASHAN, MACIAS. Date of Service: 05/11/2020 2:30 PM Medical Record Number: [id number] Patient Account Number: [id number] Date of Birth/Sex: [birth year] (74 y.o. F) Treating RN: Cornell Barman Primary Care Jennefer Kopp: [name] Other Clinician: Referring Shadrach Bartunek: [name] Treating [name] Whitwell/Extender: Tito Dine in Treatment: 74 Vital Signs Time Taken: 14:40 Temperature (F): 98.2 Height (in): 63 Pulse (bpm): 78 Weight (lbs): 224.7 Respiratory Rate (breaths/min): 18 Body Mass Index (BMI): 39.8 Blood Pressure (mmHg): 153/71 Reference Range: 80 - 120 mg / dl Electronic Signature(s) Signed: 05/11/2020 4:31:07 PM By: Lorine Bears RCP, RRT, CHT Entered By: Lorine Bears on 05/11/2020 14:45:10

## 2020-05-13 ENCOUNTER — Other Ambulatory Visit: Payer: Self-pay

## 2020-05-13 DIAGNOSIS — I890 Lymphedema, not elsewhere classified: Secondary | ICD-10-CM | POA: Diagnosis not present

## 2020-05-13 DIAGNOSIS — I82592 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: Secondary | ICD-10-CM | POA: Diagnosis not present

## 2020-05-13 DIAGNOSIS — I87332 Chronic venous hypertension (idiopathic) with ulcer and inflammation of left lower extremity: Secondary | ICD-10-CM | POA: Diagnosis not present

## 2020-05-13 DIAGNOSIS — L97221 Non-pressure chronic ulcer of left calf limited to breakdown of skin: Secondary | ICD-10-CM | POA: Diagnosis not present

## 2020-05-13 DIAGNOSIS — I83009 Varicose veins of unspecified lower extremity with ulcer of unspecified site: Secondary | ICD-10-CM | POA: Diagnosis not present

## 2020-05-14 ENCOUNTER — Other Ambulatory Visit: Payer: Self-pay | Admitting: Family Medicine

## 2020-05-16 ENCOUNTER — Ambulatory Visit (INDEPENDENT_AMBULATORY_CARE_PROVIDER_SITE_OTHER): Payer: Medicare Other | Admitting: Family Medicine

## 2020-05-16 ENCOUNTER — Encounter: Payer: Self-pay | Admitting: Family Medicine

## 2020-05-16 ENCOUNTER — Other Ambulatory Visit: Payer: Self-pay | Admitting: Family Medicine

## 2020-05-16 ENCOUNTER — Other Ambulatory Visit: Payer: Self-pay

## 2020-05-16 VITALS — BP 145/77 | HR 82 | Wt 233.0 lb

## 2020-05-16 DIAGNOSIS — I10 Essential (primary) hypertension: Secondary | ICD-10-CM

## 2020-05-16 DIAGNOSIS — R600 Localized edema: Secondary | ICD-10-CM | POA: Diagnosis not present

## 2020-05-16 DIAGNOSIS — Z85820 Personal history of malignant melanoma of skin: Secondary | ICD-10-CM

## 2020-05-16 DIAGNOSIS — C4361 Malignant melanoma of right upper limb, including shoulder: Secondary | ICD-10-CM

## 2020-05-16 NOTE — Assessment & Plan Note (Signed)
BP is mildly elevated

## 2020-05-16 NOTE — Progress Notes (Signed)
   Subjective:    Patient ID: [name] is a 74 y.o. female presenting with Gynecologic Exam  on 05/16/2020  HPI: Here today for f/u. Has h/o of melanoma and was coming for vaginal checks. No longer feels like she needs these. She is doing well except for her LE swelling and poor wound healing. Is going to the wound center. She is s/p vein stent which has helped the healing. Swelling is worse especially on the left. Has lymphedema pump but can only use it once daily. Using wraps and Jobst hose to help with swelling and daily lasix. Normal mammogram recently. No bleeding.  Review of Systems  Constitutional: Negative for chills and fever.  Respiratory: Negative for shortness of breath.   Cardiovascular: Negative for chest pain.  Gastrointestinal: Negative for abdominal pain, nausea and vomiting.  Genitourinary: Negative for dysuria.  Skin: Negative for rash.      Objective:    BP (!) 145/77   Pulse 82   Wt 233 lb (105.7 kg)   BMI 41.27 kg/m  Physical Exam Constitutional:      General: She is not in acute distress.    Appearance: She is well-developed.  HENT:     Head: Normocephalic and atraumatic.  Eyes:     General: No scleral icterus. Cardiovascular:     Rate and Rhythm: Normal rate.  Pulmonary:     Effort: Pulmonary effort is normal.  Abdominal:     Palpations: Abdomen is soft.  Musculoskeletal:     Cervical back: Neck supple.     Right lower leg: Edema present.     Left lower leg: Edema present.  Skin:    General: Skin is warm and dry.  Neurological:     Mental Status: She is alert and oriented to person, place, and time.  Psychiatric:        Mood and Affect: Mood normal.        Behavior: Behavior normal.         Assessment & Plan:   Problem List Items Addressed This Visit      Unprioritized   Melanoma ([place])   Hypertension - Primary    BP is mildly elevated      Bilateral lower extremity edema    May increase lasix to 2 pills q day or to twice  daily dosing for 3-5 days when swelling increases.        Does not need GYN exam given age.  Total time in review of prior notes, pathology, labs, history taking, review with patient, exam, note writing, discussion of options, plan for next steps, alternatives and risks of treatment: 14 minutes.  Return in about 1 year (around 05/16/2021), or if symptoms worsen or fail to improve.  Donnamae Jude 05/16/2020 5:18 PM

## 2020-05-16 NOTE — Assessment & Plan Note (Signed)
May increase lasix to 2 pills q day or to twice daily dosing for 3-5 days when swelling increases.

## 2020-05-18 ENCOUNTER — Encounter: Payer: Medicare Other | Admitting: Internal Medicine

## 2020-05-18 ENCOUNTER — Other Ambulatory Visit: Payer: Self-pay

## 2020-05-18 DIAGNOSIS — I890 Lymphedema, not elsewhere classified: Secondary | ICD-10-CM | POA: Diagnosis not present

## 2020-05-18 DIAGNOSIS — I87332 Chronic venous hypertension (idiopathic) with ulcer and inflammation of left lower extremity: Secondary | ICD-10-CM | POA: Diagnosis not present

## 2020-05-18 DIAGNOSIS — I82592 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: Secondary | ICD-10-CM | POA: Diagnosis not present

## 2020-05-18 DIAGNOSIS — L97221 Non-pressure chronic ulcer of left calf limited to breakdown of skin: Secondary | ICD-10-CM | POA: Diagnosis not present

## 2020-05-18 DIAGNOSIS — I83009 Varicose veins of unspecified lower extremity with ulcer of unspecified site: Secondary | ICD-10-CM | POA: Diagnosis not present

## 2020-05-18 NOTE — Progress Notes (Addendum)
BRADLEY, HANDYSIDE ([id number]) Visit Report for 05/13/2020 Arrival Information Details Patient Name: [name], [name]. Date of Service: 05/13/2020 9:30 AM Medical Record Number: [id number] Patient Account Number: [id number] Date of Birth/Sex: 27-Jun-1946 (74 y.o. F) Treating RN: Cornell Barman Primary Care Baillie Mohammad: [name] Other Clinician: Referring Urijah Arko: [name] Treating Adrionna Delcid/Extender: Melburn Hake, HOYT Weeks in Treatment: 74 Visit Information History Since Last Visit Has Compression in Place as Prescribed: Yes Patient Arrived: Ambulatory Pain Present Now: No Arrival Time: 16:17 Accompanied By: self Transfer Assistance: None Patient Requires Transmission-Based No Precautions: Patient Has Alerts: Yes Patient Alerts: Patient on Blood Thinner aspirin 81 Electronic Signature(s) Signed: 05/13/2020 4:17:28 PM By: [name], [name], RN, CWS, [name], [name] Entered By: [name], [name], RN, CWS, Kim on 05/13/2020 16:17:28 [name] ([id number]) -------------------------------------------------------------------------------- Compression Therapy Details Patient Name: [name], [name]. Date of Service: 05/13/2020 9:30 AM Medical Record Number: [id number] Patient Account Number: [id number] Date of Birth/Sex: 1946/10/30 (74 y.o. F) Treating RN: Cornell Barman Primary Care Cassidy Tabet: [name] Other Clinician: Referring Sabrine Patchen: [name] Treating Chaunce Winkels/Extender: Melburn Hake, HOYT Weeks in Treatment: 74 Compression Therapy Performed for Wound Assessment: Wound #5 Left,Medial Lower Leg Performed By: Clinician Army Melia, RN Compression Type: Four Layer Pre Treatment ABI: 1 Electronic Signature(s) Signed: 05/13/2020 4:18:10 PM By: [name], [name], RN, CWS, [name], [name] Entered By: [name], [name], RN, CWS, Kim on 05/13/2020 16:18:10 [name] ([id number]) -------------------------------------------------------------------------------- Compression Therapy Details Patient Name:  [name], [name]. Date of Service: 05/13/2020 9:30 AM Medical Record Number: [id number] Patient Account Number: [id number] Date of Birth/Sex: February 27, 1946 (74 y.o. F) Treating RN: Cornell Barman Primary Care Emre Stock: [name] Other Clinician: Referring Theordore Cisnero: [name] Treating Mostyn Varnell/Extender: Melburn Hake, HOYT Weeks in Treatment: 74 Compression Therapy Performed for Wound Assessment: Wound #6 Left,Lateral Lower Leg Performed By: Clinician Army Melia, RN Compression Type: Four Layer Pre Treatment ABI: 1 Electronic Signature(s) Signed: 05/13/2020 4:18:10 PM By: [name], [name], RN, CWS, [name], [name] Entered By: [name], [name], RN, CWS, Kim on 05/13/2020 16:18:10 [name] ([id number]) -------------------------------------------------------------------------------- Encounter Discharge Information Details Patient Name: [name], [name]. Date of Service: 05/13/2020 9:30 AM Medical Record Number: [id number] Patient Account Number: [id number] Date of Birth/Sex: 11-30-1945 (74 y.o. F) Treating RN: Cornell Barman Primary Care Tollie Canada: [name] Other Clinician: Referring Bindi Klomp: [name] Treating Cecil Vandyke/Extender: Melburn Hake, HOYT Weeks in Treatment: 46 Encounter Discharge Information Items Discharge Condition: Stable Ambulatory Status: Ambulatory Discharge Destination: Home Transportation: Private Auto Accompanied By: self Schedule Follow-up Appointment: Yes Clinical Summary of Care: Electronic Signature(s) Signed: 05/13/2020 4:18:54 PM By: [name], [name], RN, CWS, [name], [name] Entered By: [name], [name], RN, CWS, Kim on 05/13/2020 16:18:54 [name] ([id number]) -------------------------------------------------------------------------------- Wound Assessment Details Patient Name: [name], [name]. Date of Service: 05/13/2020 9:30 AM Medical Record Number: [id number] Patient Account Number: [id number] Date of Birth/Sex: Oct 24, 1946 (74 y.o. F) Treating RN: Cornell Barman  Primary Care Raziya Aveni: [name] Other Clinician: Referring Aleta Manternach: [name] Treating Brennan Litzinger/Extender: Melburn Hake, HOYT Weeks in Treatment: 74 Wound Status Wound Number: 5 Primary Etiology: Lymphedema Wound Location: Left, Medial Lower Leg Wound Status: Open Wounding Event: Gradually Appeared Date Acquired: 11/19/2018 Weeks Of Treatment: 74 Clustered Wound: No Wound Measurements Length: (cm) 1.5 Width: (cm) 1.7 Depth: (cm) 0.1 Area: (cm) 2.003 Volume: (cm) 0.2 % Reduction in Area: 76.6% % Reduction in Volume: 76.6% Wound Description Classification: Full Thickness Without Exposed Support Structure s Electronic Signature(s) Signed: 05/18/2020 6:25:04 PM By: [name], [name], RN, CWS, [name], [name] Entered By: [name],  [name], RN, CWS, Kim on 05/13/2020 16:17:42 KRISHA, BEEGLE ([id number]) -------------------------------------------------------------------------------- Wound Assessment Details Patient Name: [name], [name]. Date of Service: 05/13/2020 9:30 AM Medical Record Number: [id number] Patient Account Number: [id number] Date of Birth/Sex: 03/24/1946 (74 y.o. F) Treating RN: Cornell Barman Primary Care Greenly Rarick: [name] Other Clinician: Referring Luellen Howson: [name] Treating Atia Haupt/Extender: Melburn Hake, HOYT Weeks in Treatment: 74 Wound Status Wound Number: 6 Primary Etiology: Venous Leg Ulcer Wound Location: Left, Lateral Lower Leg Wound Status: Open Wounding Event: Gradually Appeared Date Acquired: 01/19/2019 Weeks Of Treatment: 68 Clustered Wound: No Wound Measurements Length: (cm) 0.1 Width: (cm) 0.1 Depth: (cm) 0.1 Area: (cm) 0.008 Volume: (cm) 0.001 % Reduction in Area: 96.4% % Reduction in Volume: 95.5% Wound Description Classification: Full Thickness Without Exposed Support Structure s Electronic Signature(s) Signed: 05/18/2020 6:25:04 PM By: [name], [name], RN, CWS, [name], [name] Entered By: [name], [name], RN, CWS, Kim on 05/13/2020  16:17:42

## 2020-05-19 ENCOUNTER — Other Ambulatory Visit: Payer: Self-pay | Admitting: Neurological Surgery

## 2020-05-19 DIAGNOSIS — M5416 Radiculopathy, lumbar region: Secondary | ICD-10-CM

## 2020-05-19 NOTE — Progress Notes (Signed)
MAYLIN, FREE[place] ([id number]) Visit Report for 05/18/2020 HPI Details Patient Name: [name], [name]. Date of Service: 05/18/2020 3:30 PM Medical Record Number: [id number] Patient Account Number: [id number] Date of Birth/Sex: Aug 28, 1946 (74 y.o. F) Treating RN: Cornell Barman Primary Care Provider: [name] Other Clinician: Referring Provider: [name] Treating Provider/Extender: Tito Dine in Treatment: 21 History of Present Illness HPI Description: Pleasant 74 year old with history of chronic venous insufficiency. No diabetes or peripheral vascular disease. Left ABI 1.29. Questionable history of left lower extremity DVT. She developed a recurrent ulceration on her left lateral calf in December 2015, which she attributes to poor diet and subsequent lower extremity edema. She underwent endovenous laser ablation of her left greater saphenous vein in 2010. She underwent laser ablation of accessory branch of left GSV in April 2016 by Dr. Kellie Simmering at [facility]. She was previously wearing Unna boots, which she tolerated well. Tolerating 2 layer compression and cadexomer iodine. She returns to clinic for follow-up and is without new complaints. She denies any significant pain at this time. She reports persistent pain with pressure. No claudication or ischemic rest pain. No fever or chills. No drainage. READMISSION 11/13/16; this is a 74 year old woman who is not a diabetic. She is here for a review of a painful area on her left medial lower extremity. I note that she was seen here previously last year for wound I believe to be in the same area. At that time she had undergone previously a left greater saphenous vein ablation by Dr. Kellie Simmering and she had a ablation of the anterior accessory branch of the left greater saphenous vein in March 2016. Seeing that the wound actually closed over. In reviewing the history with her today the ulcer in this area has been recurrent. She describes  a biopsy of this area in 2009 that only showed stasis physiology. 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 Do not see that she has ever had arterial studies in the left leg. She tells me she has a follow-up with Dr. Kellie Simmering in roughly 10 days In any case she developed the reopening of this area roughly a month ago. On the background of this she describes rapidly increasing edema which has responded to Lasix 40 mg and metolazone 2.5 mg as well as the patient's lymph massage. She has been told she has both venous insufficiency and lymphedema but she cannot tolerate compression stockings 11/28/16; the patient saw Dr. Kellie Simmering recently. Per the patient he did arterial Dopplers in the office that did not show evidence of arterial insufficiency, per the patient he stated "treat this like an ordinary venous ulcer". She also saw her dermatologist Dr. Ronnald Ramp who felt that this was more of a vascular ulcer. In general things are improving although she arrives today with increasing bilateral lower extremity edema with weeping a deeper fluid through the wound on the left medial leg compatible with some degree of lymphedema 12/04/16; the patient's wound is fully epithelialized but I don't think fully healed. We will do another week of depression with Promogran and TCA however I suspect we'll be able to discharge her next week. This is a very unusual-looking wound which was initially a figure-of-eight type wound lying on its side surrounded by petechial like hemorrhage. She  has had venous ablation on this side. She apparently does not have an arterial issue per Dr. Kellie Simmering. She saw her dermatologist thought it was "vascular". Patient is definitely going to need ongoing compression and I talked about  this with her today she will go to elastic therapy after she leaves here next week 12/11/16; the patient's wound is not completely closed today. She has surrounding scar tissue and in further discussion with the patient it would appear that she had ulcers in this area in 2009 for a prolonged period of time ultimately requiring a punch biopsy of this area that only showed venous insufficiency. I did not previously pickup on this part of the history from the patient. 12/18/16; the patient's wound is completely epithelialized. There is no open area here. She has significant bilateral venous insufficiency with secondary lymphedema to a mild-to-moderate degree she does not have compression stockings.. She did not say anything to me when I was in the room, she told our intake nurse that she was still having pain in this area. This isn't unusual recurrent small open area. She is going to go to elastic therapy to obtain compression stockings. 12/25/16; the patient's wound is fully epithelialized. There is no open area here. The patient describes some continued episodic discomfort in this area medial left calf. However everything looks fine and healed here. She is been to elastic therapy and caught herself 15-20 mmHg stockings, they apparently were having trouble getting 20-30 mm stockings in her size 01/22/17; this is a patient we discharged from the clinic a month ago. She has a recurrent open wound on her medial left calf. She had 15 mm support stockings. I told her I thought she needed 20-30 mm compression stockings. She tells me that she has been ill with hospitalization secondary to asthma and is been found to have severe hypokalemia likely secondary to a combination of Lasix and metolazone. This morning she noted blistering and leaking fluid on the posterior part of her left leg. She called our intake nurse urgently and we was saw her this afternoon. She has not had any real discomfort here. I don't know  that she's been wearing any stockings on this leg for at least 2-3 days. ABIs in this clinic were 1.21 on the right and 1.3 on the left. She is previously seen vascular surgery who does not think that there is a peripheral arterial issue. 01/30/17; Patient arrives with no open wound on the left leg. She has been to elastic therapy and obtained 20-14mmhg below knee stockings and she has one on the right leg today. READMISSION 02/19/18; this Cirrincione is a now 74 year old patient we've had in this clinic perhaps 3 times before. I had last looked at her from January 07 December 2016 with an area on the medial left leg. We discharged her on 12/25/16 however she had to be readmitted on 01/22/17 with a recurrence. I have in my notes that we discharged her on 20-30 mm stockings although she tells me she was only wearing support hose because she cannot get stockings on predominantly related to her cervical spine surgery/issues. She has had previous ablations done by vein and vascular in [place] including a great saphenous vein ablation on the left with an anterior accessory branch ablation I think both of these were in 2016. On one of the previous visit she had a biopsy noted 2009 that was negative. She is not felt to have an arterial issue. She is not a diabetic. She does  have a history of obstructive sleep apnea hypertension asthma as well as chronic venous insufficiency and lymphedema. On this occasion she noted 2 dry scaly patch on her left leg. She tried to put lotion on this it didn't really help. There were 2 open areas.the [name], [name] ([id number]) patient has been seeing her primary physician from 02/05/18 through 02/14/18. She had Unna boots applied. The superior wound now on the lateral left leg has closed but she's had one wound that remains open on the lateral left leg. This is not the same spot as we dealt with in 2018. ABIs in this clinic were 1.3 bilaterally 02/26/18; patient has a small wound on  the left lateral calf. Dimensions are down. She has chronic venous insufficiency and lymphedema. 03/05/18; small open area on the left lateral calf. Dimensions are down. Tightly adherent necrotic debris over the surface of the wound which was difficult to remove. Also the dressing [over collagen] stuck to the wound surface. This was removed with some difficulty as well. Change the primary dressing to Hydrofera Blue ready 03/12/18; small open area on the left lateral calf. Comes in with tightly adherent surface eschar as well as some adherent Hydrofera Blue. 03/19/18; open area on the left lateral calf. Again adherent surface eschar as well as some adherent Hydrofera Blue nonviable subcutaneous tissue. She complained of pain all week even with the reduction from 4-3 layer compression I put on last week. Also she had an increase in her ankle and calf measurements probably related to the same thing. 03/26/18; open area on the left lateral calf. A very small open area remains here. We used silver alginate starting last week as the Hydrofera Blue seem to stick to the wound bed. In using 4-layer compression 04/02/18; the open area in the left lateral calf at some adherent slough which I removed there is no open area here. We are able to transition her into her own compression stocking. Truthfully I think this is probably his support hose. However this does not maintain skin integrity will be limited. She cannot put over the toe compression stockings on because of neck problems hand problems etc. She is allergic to the lining layer of juxta lites. We might be forced to use extremitease stocking should this fail READMIT 11/24/2018 Patient is now a 74 year old woman who is not a diabetic. She has been in this clinic on at least 3 previous occasions largely with recurrent wounds on her left leg secondary to chronic venous insufficiency with secondary lymphedema. Her situation is complicated by inability to get  stockings on and an allergy to neoprene which is apparently a component and at least juxta lites and other stockings. As a result she really has not been wearing any stockings on her legs. She tells [place] that roughly 2 or 3 weeks ago she started noticing a stinging sensation just above her ankle on the left medial aspect. She has been diagnosed with pseudogout and she wondered whether this was what she was experiencing. She tried to dress this with something she bought at the store however subsequently it pulled skin off and now she has an open wound that is not improving. She has been using Vaseline gauze with a cover bandage. She saw her primary doctor last week who put an Haematologist on her. ABIs in this clinic was 1.03 on the left 2/12; the area is on the left medial ankle. Odd-looking wound with what looks to be surface epithelialization but a multitude of  small petechial openings. This clearly not closed yet. We have been using silver alginate under 3 layer compression with TCA 2/19; the wound area did not look quite as good this week. Necrotic debris over the majority of the wound surface which required debridement. She continues to have a multitude of what looked to be small petechial openings. She reminds [place] that she had a biopsy on this initially during her first outbreak in 2015 in [place] [facility]. She expresses concern about this being a possible melanoma. 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 I assured her that this does not look anything like melanoma. Except for the petechial reaction it does look like a venous insufficiency area and she certainly has evidence of this on both sides 2/26; a difficult area on the left medial ankle. The patient clearly has chronic venous hypertension with some degree of lymphedema. The odd thing about the area is the small petechial hemorrhages. I am not  really sure how to explain this. This was present last time and this is not a compression injury. We have been using Hydrofera Blue which I changed to last week 3/4; still using Hydrofera Blue. Aggressive debridement today. She does not have known arterial issues. She has seen Dr. Kellie Simmering at [facility] vein and vascular and and has an ablation on the left. [Anterior accessory branch of the greater saphenous]. From what I remember they did not feel she had an arterial issue. The patient has had this area biopsied in 2009 at [facility] dermatology and by her recollection they said this was "stasis". She is also follow-up with dermatology locally who thought that this was more of a vascular issue 3/11; using Hydrofera Blue. Aggressive debridement today. She does not have an arterial issue. We are using 3 layer compression although we may need to go to 4. The patient has been in for multiple changes to her wrap since I last saw her a week ago. She says that the area was leaking. I do not have too much more information on what was found 01/19/19 on evaluation today patient was actually being seen for a nurse visit when unfortunately she had the area on her left lateral lower extremity as well as weeping from the right lower extremity that became apparent. Therefore we did end up actually seeing her for a full visit with myself. She is having some pain at this site as well but fortunately nothing too significant at this point. No fevers, chills, nausea, or vomiting noted at this time. 3/18-Patient is back to the clinic with the left leg venous leg ulcer, the ulcer is larger in size, has a surface that is densely adherent with fibrinous tissue, the Hydrofera Blue was used but is densely adherent and there was difficulty in removing it. The right lower extremity was also wrapped for weeping edema. Patient has a new area over the left lateral foot above the malleolus that is small and appears to have no debris  with intact surrounding skin. Patient is on increased dose of Lasix also as a means to edema management 3/25; the patient has a nonhealing venous ulcer on the medial left leg and last week developed a smaller area on the lateral left calf. We have been using Hydrofera Blue with a contact layer. 4/1; no major change in these wounds areas. Left medial and more recently left lateral calf. I tried Iodoflex last week to aid in debridement  she did not tolerate this. She stated her pain was terrible all week. She took the top layer of the 4 layer compression off. 4/8; the patient actually looks somewhat better in terms of her more prominent left lateral calf wound. There is some healthy looking tissue here. She is still complaining of a lot of discomfort. 4/15; patient in a lot of pain secondary to sciatica. She is on a prednisone taper prescribed by her primary physician. She has the 2 areas one on the left medial and more recently a smaller area on the left lateral calf. Both of these just above the malleoli 4/22; her back pain is better but she still states she is very uncomfortable and now feels she is intolerant to the [employer]. No real change in the wounds we have been using Sorbact. She has been previously intolerant to Iodoflex. There is not a lot of option about what we can use to debride this wound under compression that she no doubt needs. sHe states Ultram no longer works for her pain 4/29; no major change in the wounds slightly increased depth. Surface on the original medial wound perhaps somewhat improved however the more recent area on the lateral left ankle is 100% covered in very adherent debris we have been using Sorbact. She tolerates 4 layer compression well and her edema control is a lot better. She has not had to come in for a nurse check 5/6; no major change in the condition of the wounds. She did consent to debridement today which was done with some difficulty. Continuing Sorbact.  She did not tolerate Iodoflex. She was in for a check of her compression the day after we wrapped her last week this was adjusted but nothing much was found 5/13; no major change in the condition or area of the wounds. I was able to get a fairly aggressive debridement done on the lateral left leg wound. Even using Sorbact under compression. She came back in on Friday to have the wrap changed. She says she felt uncomfortable on the [name], [name]. ([id number]) lateral aspect of her ankle. She has a long history of chronic venous insufficiency including previous ablation surgery on this side. 5/20-Patient returns for wounds on left leg with both wounds covered in slough, with the lateral leg wound larger in size, she has been in 3 layer compression and felt more comfortable, she describes pain in ankle, in leg and pins and [name] in foot, and is about to try Pamelor for this 6/3; wounds on the left lateral and left medial leg. The area medially which is the most recent of the 2 seems to have had the largest increase in dimensions. We have been using Sorbac to try and debride the surface. She has been to see orthopedics they apparently did a plain x-ray that was indeterminant. Diagnosed her with neuropathy and they have ordered an MRI to determine if there is underlying osteomyelitis. This was not high on my thought list but I suppose it is prudent. We have advised her to make an appointment with vein and vascular in [place]. She has a history of a left greater saphenous and accessory vein ablations I wonder if there is anything else that can be done from a surgical point of view to help in these difficult refractory wounds. We have previously healed this wound on one occasion but it keeps on reopening [medial side] 6/10; deep tissue culture I did last week I think on the left medial wound showed  both moderate E. coli and moderate staph aureus [MSSA]. She is going to require antibiotics and I have  chosen Augmentin. We have been using Sorbact and we have made better looking wound surface on both sides but certainly no improvement in wound area. She was back in last Friday apparently for a dressing changes the wrap was hurting her outer left ankle. She has not managed to get a hold of vein and vascular in [place]. We are going to have to make her that appointment 6/17; patient is tolerating the Augmentin. She had an MRI that I think was ordered by orthopedic surgeon this did not show osteomyelitis or an abscess did suggest cellulitis. We have been using Sorbact to the lateral and medial ankles. We have been trying to arrange a follow-up appointment with vein and vascular in [place] or did her original ablations. We apparently an area sent the request to vein and vascular in [facility] 6/24; patient has completed the Augmentin. We do not yet have a vein and vascular appointment in [place]. I am not sure what the issue is here we have asked her to call tomorrow. We are using Sorbact. Making some improvements and especially the medial wound. Both surfaces however look better medial and lateral. 7/1; the patient has been in contact with vein and vascular in [place] but has not yet received an appointment. Using Sorbact we have gradually improve the wound surface with no improvement in surface area. She is approved for Apligraf but the wound surface still is not completely viable. She has not had to come in for a dressing change 7/8; the patient has an appointment with vein and vascular on 7/31 which is a Friday afternoon. She is concerned about getting back here for [place] to dress her wounds. I think it is important to have them goal for her venous reflux/history of ablations etc. to see if anything else can be done. She apparently tested positive for 1 of the blood tests with regards to lupus and saw a rheumatologist. He has raised the issue of vasculitis again. I have had this thought in  the past however the evidence seems overwhelming that this is a venous reflux etiology. If the rheumatologist tells me there is clinical and laboratory investigation is positive for lupus I will rethink this. 7/15; the patient's wound surfaces are quite a bit better. The medial area which was her original wound now has no depth although the lateral wound which was the more recent area actually appears larger. Both with viable surfaces which is indeed better. Using Sorbact. I wanted to use Apligraf on her however there is the issue of the vein and vascular appointment on 7/31 at 2:00 in the afternoon which would not allow her to get back to be rewrapped and they would no doubt remove the graft 7/22; the patient's wound surfaces have moderate amount of debris although generally look better. The lateral one is larger with 2 small satellite areas superiorly. We are waiting for her vein and vascular appointment on 7/31. She has been approved for Apligraf which I would like to use after th 7/29; wound surfaces have improved no debridement is required we have been using Sorbact. She sees vein and vascular on Friday with this so question of whether anything can be done to lessen the likelihood of recurrence and/or speed the healing of these areas. She is already had previous ablations. She no doubt has severe venous hypertension 8/5-Patient returns at 1 week, she was in [employer]  for 3 days by her podiatrist, we have been using so backed to the wound, she has increased pain in both the wounds on the left lower leg especially the more distal one on the lateral aspect 8/12-Patient returns at 1 week and she is agreeable to having debridement in both wounds on her left leg today. We have been using Sorbact, and vascular studies were reviewed at last visit 8/19; the patient arrives with her wounds fairly clean and no debridement is required. We have used Sorbact which is really done a nice job in cleaning up  these very difficult wound surfaces. The patient saw Dr. Donzetta Matters of vascular surgery on 7/31. He did not feel that there was an arterial component. He felt that her treated greater saphenous vein is adequately addressed and that the small saphenous vein did not appear to be involved significantly. She was also noted to have deep venous reflux which is not treatable. Dr. Donzetta Matters mentioned the possibility of a central obstructive component leading to reflux and he offered her central venography. She wanted to discuss this or think about it. I have urged her to go ahead with this. She has had recurrent difficult wounds in these areas which do heal but after months in the clinic. If there is anything that can be done to reduce the likelihood of this I think it is worth it. 9/2 she is still working towards getting follow-up with Dr. Donzetta Matters to schedule her CT. Things are quite a bit worse venography. I put Apligraf on 2 weeks ago on both wounds on the medial and lateral part of her left lower leg. She arrives in clinic today with 3 superficial additional wounds above the area laterally and one below the wound medially. She describes a lot of discomfort. I think these are probably wrapped injuries. Does not look like she has cellulitis. 07/20/2019 on evaluation today patient appears to be doing somewhat poorly in regard to her lower extremity ulcers. She in fact showed signs of erythema in fact we may even be dealing with an infection at this time. Unfortunately I am unsure if this is just infection or if indeed there may be some allergic reaction that occurred as a result of the Apligraf application. With that being said that would be unusual but nonetheless not impossible in this patient is one who is unfortunately allergic to quite a bit. Currently we have been using the Sorbact which seems to do as well as anything for her. I do think we may want to obtain a culture today to see if there is anything showing up  there that may need to be addressed. 9/16; noted that last week the wounds look worse in 1 week follow-up of the Apligraf. Using Sorbact as of 2 days ago. She arrives with copious amounts of drainage and new skin breakdown on the back of the left calf. The wounds arm more substantial bilaterally. There is a fair amount of swelling in the left calf no overt DVT there is edema present I think in the left greater than right thigh. She is supposed to go on 9/28 for CT venography. The wounds on the medial and lateral calf are worse and she has new skin breakdown posteriorly at least new for me. This is almost developing into a circumferential wound area The Apligraf was taken off last week which I agree with things are not going in the right direction a culture was done we do not have that back yet. She is  on Augmentin that she started 2 days ago 9/23; dressing was changed by her nurses on Monday. In general there is no improvement in the wound areas although the area looks less angry than last week. She did get Augmentin for MSSA cultured on the 14th. She still appears to have too much swelling in the left leg even with 3 layer compression 9/30; the patient underwent her procedure on 9/28 by Dr. Donzetta [facility] at [facility]. She was discovered to have the common iliac vein measuring 12.2 mm but at the level of L4-L5 measured 3 mm. After stenting it measured 10 mm. 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 Rouleaux flow in the common femoral and femoral vein was observed much improved after stenting. We are using silver alginate to the wounds on the medial and lateral ankle on the left. 4 layer compression 10/7; the patient had fluid swelling around her knee and 4 layer compression. At the advice of vein and vascular this was reduced to 3 layer which she is tolerating better. We have been using silver alginate under 3 layer compression since last Friday 10/14; arrives with the  areas on the left ankle looking a lot better. Inflammation in the area also a lot better. She came in for a nurse check on [name], [name] ([id number]) 10/9 10/21; continued nice improvement. Slight improvements in surface area of both the medial and lateral wounds on the left. A lot of the satellite lesions in the weeping erythema around these from stasis dermatitis is resolved. We have been using silver alginate 10/28; general improvement in the entire wound areas although not a lot of change in dimensions the wound certainly looks better. There is a lot less in terms of venous inflammation. Continue silver alginate this week however look towards Hydrofera Blue next week 11/4; very adherent debris on the medial wound left wound is not as bad. We have been using silver alginate. Change to [facility] today 11/11; very adherent debris on both wound areas. She went to vein and vascular last week and follow-up they put in [place] boot on this today. He says the [facility] was adherent. Wound is definitely not as good as last week. Especially on the left there the satellite lesions look more prominent 11/18; absolutely no better. erythema on lateral aspect with tenderness. 09/30/2019 on evaluation today patient appears to actually be doing better. Dr. Dellia Nims did put her on doxycycline last week which I do believe has helped her at this point. Fortunately there is no signs of active infection at this time. No fevers, chills, nausea, vomiting, or diarrhea. I do believe he may want extend the doxycycline for 7 additional days just to ensure everything does completely cleared up the patient is in agreement with that plan. Otherwise she is going require some sharp debridement today 12/2; patient is completing a 2-week course of doxycycline. I gave her this empirically for inflammation as well as infection when I last saw her 2 weeks ago. All of this seems to be better. She is using silver alginate she  has the area on the medial aspect of the larger area laterally and the 2 small satellite regions laterally above the major wound. 12/9; the patient's wound on the left medial and left lateral calf look really quite good. We have been using silver alginate. She saw vein and vascular in follow-up on 10/09/2019. She has had a previous left greater saphenous vein ablation by Dr. Oscar La in 2016.  More recently she underwent a left common iliac vein stent by Dr. 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 The swelling is improved and certainly the wounds have improved. The patient shows [place] today area on the right medial calf there is almost no wound but leaking lymphedema. She says she start this started 3 or 4 days ago. She did not traumatize it. It is not painful. She does not wear compression on that side 12/16; the patient continues to do well laterally. Medially still requiring debridement. The area on the right calf did not materialize to anything and is not currently open. We wrapped this last time. She has support stockings for that leg although I am not sure they are going to provide adequate compression 12/23; the lateral wound looks stable. Medially still requiring debridement for tightly adherent fibrinous debris. We've been using silver alginate. Surface area not any different 12/30; neither wound is any better with regards to surface and the area on the left lateral is larger. I been using silver alginate to the left lateral which look quite good last week and Sorbact to the left medial 11/11/2019. Lateral wound area actually looks better and somewhat smaller. Medial still requires a very aggressive debridement today. We have been using Sorbact on both wound areas 1/13; not much better still adherent debris bilaterally. I been using Sorbact. She has severe venous hypertension. Probably some degree of dermal fibrosis distally. I wonder whether tighter compression might help and I am going  to try that today. We also need to work on the bioburden 1/20; using Sorbact. She has severe venous hypertension status post stent placement for pelvic vein compression. We applied gentamicin last time to see if we could reduce bioburden I had some discussion with her today about the use of pentoxifylline. This is occasionally used in this setting for wounds with refractory venous insufficiency. However this interacts with Plavix. She tells me that she was put on this after stent placement for 3 months. She will call Dr. Claretha Cooper office to discuss 1/27; we are using gentamicin under Sorbact. 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 She has a stent. Wound medially is measuring smaller this week. Laterally measuring slightly larger although she has some satellite lesions superiorly 2/3; gentamicin under Sorbact under 4-layer compression. 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 She has a stent on Plavix. Her wounds are measuring smaller this week. More substantially laterally where there is a satellite lesion superiorly. 2/10; gentamicin under Sorbac. 4-layer compression. Patient communicated with Dr. Donzetta Matters at vein and vascular in [place]. He is okay with the patient coming off Plavix I will therefore start her on pentoxifylline for a 1 month trial. In general her wounds look better today. I had some concerns about swelling in the left thigh however she measures 61.5 on the right and 63 on the mid thigh which does not suggest there is any difficulty. The patient is not describing any pain. 2/17; gentamicin under Sorbac 4-layer compression. She has been on pentoxifylline for 1 week and complains of loose stool. No nausea she is eating and drinking well 2/24; the patient apparently came in 2 days ago for a nurse visit when her wrap fell down. Both areas look a little worse this week macerated medially and satellite lesions laterally. Change to  silver alginate today 3/3; wounds are larger today especially medially. She also has more swelling in her foot lower leg and I even noted some swelling in  her posterior thigh which is tender. I wonder about the patency of her stent. Fortuitously she sees Dr. Claretha Cooper group on Friday 3/10; Mrs. Jakel was seen by vein and vascular on 3/5. The patient underwent ultrasound. There was no evidence of thrombosis involving the IVC no evidence of thrombosis involving the right common iliac vein there is no evidence of thrombosis involving the right external iliac vein the left external vein is also patent. The right common iliac vein stent appears patent bilateral common femoral veins are compressible and appear patent. I was concerned about the left common iliac stent however it looks like this is functional. She has some edema in the posterior thigh that was tender she still has that this week. I also note they had trouble finding the pulses in her left foot and booked her for an ABI baseline in 4 weeks. She will follow up in 6 months for repeat IVC duplex. The patient stopped the pentoxifylline because of diarrhea. It does not look like that was being effective in any case. I have advised her to go back on her aspirin 81 mg tablet, vascular it also suggested this 3/17; comes in today with her wound surfaces a lot better. The excoriations from last week considerably better probably secondary to the TCA. We have been using silver alginate 3/24; comes in today with smaller wounds both medially and laterally. Both required debridement. There are 2 small satellite areas superiorly laterally. She also has a very odd bandlike area in the mid calf almost looking like there was a weakness in the wrap in a localized area. I would write this off as being this however anteriorly she has a small raised ballotable area that is very tender almost reminiscent of an abscess but there was no obvious purulent surface to it.  02/04/20 upon evaluation today patient appears to be doing fairly well in regard to her wounds today. Fortunately there is no signs of active [name], [name]. ([id number]) infection at this time. No fevers, chills, nausea, vomiting, or diarrhea. She has been tolerating the dressing changes without complication. Fortunately I feel like she is showing signs of improvement although has been sometime since have seen her. Nonetheless the area of concern that Dr. Dellia Nims had last week where she had possibly an area of the wrap that was we can allow the leg to bulge appears to be doing significantly better today there is no signs of anything worsening. 4/7; the patient's wounds on her medial and lateral left leg continue to contract. We have been using a regular alginate. Last week she developed an area on the right medial lower leg which is probably a venous ulcer as well. 4/14; the wounds on her left medial and lateral lower leg continue to contract. Surface eschar. We have been using regular alginate. The area on the right medial lower leg is closed. We have been putting both legs under 4-layer contraction. The patient went back to see vein and vascular she had arterial studies done which were apparently "quite good" per the patient although I have not read their notes I have never felt she had an arterial issue. 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 This is been longstanding and refractory to exercise, leg elevation and longstanding use of compression wraps in our clinic as well as compression stockings on the times we have been able to get these to heal 4/21; we thought she actually might be close this week however she arrives in  clinic with a lot of edema in her upper left calf and into her posterior thigh. This is been an intermittent problem here. She says the wrap fell down but it was replaced with a nurse visit on Monday. We are using calcium  alginate to the wounds and the wound sizes there not terribly larger than last week but there is a lot more edema 4/28; again wound edges are smaller on both sides. Her edema is better controlled than last time. She is obtained her compression pumps from [employer] although they have not been to her home to set these up. 5/5; left medial and left lateral both look stable. I am not sure the medial is any smaller. We have been using calcium alginate under 4-layer compression. oShe had an area on the right medial. This was eschared today. We have been wrapping this as well. She does not tolerate external compression stockings due to a history of various contact allergies. She has her compression pumps however the representative from the company is coming on her to show her how to use these tomorrow 5/19; patient with severe chronic venous insufficiency secondary to central venous disease. She had a stent placed in her left common iliac vein. She has done better since but still difficult to control wounds. She comes in today with nothing open on the right leg. Her areas on the left medial and left lateral are just about closed. We are using calcium alginate under 4-layer compression. She is using her external compression pumps at home She only has 15-20 support stockings. States she cannot get anything tighter than that on. 03/30/20-Patient returns at 1 week, the wounds on the left leg are both slightly bigger, the last week she was on 3 layer compression which started to slide down. She is starting to use her lymphedema pumps although she stated on 1 day her right ankle started to swell up and she have to stop that day. Unfortunately the open area seem to oscillate between improving to the point of healing and then flaring up all to do with effectiveness of compression or lack of due to the left leg topography not keeping the compression wraps from rolling down 6/2 patient comes in with a 15/20  mmHg stocking on the right leg. She tells me that she developed a lot of swelling in her ankles she saw orthopedics she was felt to possibly be having a flare of pseudogout versus some other type of arthritis. She was put on steroids for a respiratory issue so that helps with the inflammation. She has not been using the pumps all week. She thinks the left thigh is more swollen than usual and I would agree with that. She has an appointment with Dr. Donzetta Matters 9 days or so from now 6/9; both wounds on the left medial and left lateral are smaller. We have been using calcium alginate under compression. She does not have an open wound on the right leg she is using a stocking and her compression pumps things are going well. She has an appointment with Dr. Donzetta Matters with regards to her stent in the left common iliac vein 6/16; the wounds on the left medial and left lateral ankle continues to contract. The patient saw Dr. Donzetta Matters and I think he seems satisfied. Ordered follow-up venous reflux studies on both sides in September. Cautioned that she may need thigh-high stockings. She has been using calcium alginate under compression on the left and her own stocking on the right leg.  She tells [place] there are no open wounds on the right 6/23; left lateral is just about closed. Medial required debridement today. We have been using calcium alginate. Extensive discussion about the compression pumps she is only using these on 25 mmHg states she could not take 40 or 30 when the wrap came out to her home to demonstrate these. He said they should not feel tight 6/30; the left lateral wound has a slight amount of eschar. . The area medially is about the same using Hydrofera Blue. 7/7; left lateral wound still has some eschar. I will remove this next week may be closed. The area medially is very small using Hydrofera Blue with improvement. Unfortunately the stockings fell down. Unfortunately the blisters have developed at the edge of  where the wrap fell. When this happened she says her legs hurt she did not use her pumps. We are not open Monday for her to come in and change the wraps and she had an appointment yesterday. She also tells me that she is going to have an MRI of her back. She is having pain radiating into her left anterior leg she thinks her from an L5 disc. She saw Dr. Ellene Route of neurosurgery 7/14; the area on the left lateral ankle area is closed. Still a small area medially however it looks better as well. We have been using Hydrofera Blue under 4-layer compression Electronic Signature(s) Signed: 05/18/2020 4:21:24 PM By: Linton Ham MD Entered By: Linton Ham on 05/18/2020 16:14:55 [place], [name] ([id number]) -------------------------------------------------------------------------------- Physical Exam Details Patient Name: SAMEERA, BETTON. Date of Service: 05/18/2020 3:30 PM Medical Record Number: [id number] Patient Account Number: [id number] Date of Birth/Sex: 10-05-1946 (74 y.o. F) Treating RN: Cornell Barman Primary Care Provider: [name] Other Clinician: Referring Provider: [name] Treating Provider/Extender: Tito Dine in Treatment: 67 Constitutional Patient is hypertensive.. Pulse regular and within target range for patient.Marland Kitchen Respirations regular, non-labored and within target range.. Temperature is normal and within the target range for the patient.Marland Kitchen appears in no distress. Cardiovascular Pedal pulses are palpable on the left. The patient has a considerable amount of localized swelling in the left leg from the mid calf towards her knee. This is pitting not particularly warm or erythematous.. Notes Wound exam; surface required debridement with a #3 curette including skin and necrotic debris over the wound surface. Hemostasis with direct pressure. Electronic Signature(s) Signed: 05/18/2020 4:21:24 PM By: Linton Ham MD Entered By: Linton Ham on  05/18/2020 16:18:53 [place], [name] ([id number]) -------------------------------------------------------------------------------- Physician Orders Details Patient Name: SHYRA, EMILE. Date of Service: 05/18/2020 3:30 PM Medical Record Number: [id number] Patient Account Number: [id number] Date of Birth/Sex: 1946/03/14 (74 y.o. F) Treating RN: Cornell Barman Primary Care Provider: [name] Other Clinician: Referring Provider: [name] Treating Provider/Extender: Tito Dine in Treatment: 78 Verbal / Phone Orders: No Diagnosis Coding Anesthetic (add to Medication List) Wound #5 Left,Medial Lower Leg o Topical Lidocaine 4% cream applied to wound bed prior to debridement (In Clinic Only). Primary Wound Dressing Wound #5 Left,Medial Lower Leg o Hydrafera Blue Ready Transfer Secondary Dressing Wound #5 Left,Medial Lower Leg o ABD pad Dressing Change Frequency Wound #5 Left,Medial Lower Leg o Change dressing every week o Other: - Nurse visit Friday and Monday if needed. Follow-up Appointments Wound #5 Left,Medial Lower Leg o Return Appointment in 1 week. o Nurse Visit as needed Edema Control Wound #5 Left,Medial Lower Leg o 4-Layer Compression System - Left Lower Extremity. o Patient to wear own  compression stockings - On Right o Compression Pump: Use compression pump on left lower extremity for 60 minutes, twice daily. o Compression Pump: Use compression pump on right lower extremity for 60 minutes, twice daily. Electronic Signature(s) Signed: 05/18/2020 4:21:24 PM By: Linton Ham MD Signed: 05/18/2020 6:25:04 PM By: [name], [name], RN, CWS, [name], [name] Entered By: [name], [name], RN, CWS, Kim on 05/18/2020 16:07:00 [name], [name] ([id number]) -------------------------------------------------------------------------------- Problem List Details Patient Name: [name], [name]. Date of Service: 05/18/2020 3:30 PM Medical Record Number:  [id number] Patient Account Number: [id number] Date of Birth/Sex: 12-11-1945 (74 y.o. F) Treating RN: Cornell Barman Primary Care Provider: [name] Other Clinician: Referring Provider: [name] Treating Provider/Extender: Tito Dine in Treatment: 66 Active Problems ICD-10 Encounter Code Description Active Date MDM Diagnosis L97.221 Non-pressure chronic ulcer of left calf limited to breakdown of skin 01/07/2019 No Yes I89.0 Lymphedema, not elsewhere classified 12/10/2018 No Yes I87.332 Chronic venous hypertension (idiopathic) with ulcer and inflammation of 12/09/2019 No Yes left lower extremity I83.009 Varicose veins of unspecified lower extremity with ulcer of unspecified 04/13/2020 No Yes site I82.592 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 12/09/2019 No Yes lower extremity Inactive Problems ICD-10 Code Description Active Date Inactive Date L97.818 Non-pressure chronic ulcer of other part of right lower leg with other specified 10/14/2019 10/14/2019 severity Resolved Problems ICD-10 Code Description Active Date Resolved Date L97.211 Non-pressure chronic ulcer of right calf limited to breakdown of skin 02/10/2020 02/10/2020 Electronic Signature(s) Signed: 05/18/2020 4:21:24 PM By: Linton Ham MD Entered By: Linton Ham on 05/18/2020 16:14:08 [name] ([id number]) -------------------------------------------------------------------------------- Progress Note Details Patient Name: [name], [name]. Date of Service: 05/18/2020 3:30 PM Medical Record Number: [id number] Patient Account Number: [id number] Date of Birth/Sex: 30-Jul-1946 (74 y.o. F) Treating RN: Cornell Barman Primary Care Provider: [name] Other Clinician: Referring Provider: [name] Treating Provider/Extender: Tito Dine in Treatment: 75 Subjective History of Present Illness (HPI) Pleasant 75 year old with history of chronic venous  insufficiency. No diabetes or peripheral vascular disease. Left ABI 1.29. Questionable history of left lower extremity DVT. She developed a recurrent ulceration on her left lateral calf in December 2015, which she attributes to poor diet and subsequent lower extremity edema. She underwent endovenous laser ablation of her left greater saphenous vein in 2010. She underwent laser ablation of accessory branch of left GSV in April 2016 by Dr. Kellie Simmering at [facility]. She was previously wearing [place] boots, which she tolerated well. Tolerating 2 layer compression and cadexomer iodine. She returns to clinic for follow-up and is without new complaints. She denies any significant pain at this time. She reports persistent pain with pressure. No claudication or ischemic rest pain. No fever or chills. No drainage. READMISSION 11/13/16; this is a 74 year old woman who is not a diabetic. She is here for a review of a painful area on her left medial lower extremity. I note that she was seen here previously last year for wound I believe to be in the same area. At that time she had undergone previously a left greater saphenous vein ablation by Dr. Kellie Simmering and she had a ablation of the anterior accessory branch of the left greater saphenous vein in March 2016. Seeing that the wound actually closed over. In reviewing the history with her today the ulcer in this area has been recurrent. She describes a biopsy of this area in 2009 that only showed stasis physiology. She also has a history of today malignant melanoma  in the right shoulder for which she follows with Dr. 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 Do not see that she has ever had arterial studies in the left leg. She tells me she has a follow-up with Dr. Kellie Simmering in roughly 10 days In any case she developed the reopening of this area roughly a month  ago. On the background of this she describes rapidly increasing edema which has responded to Lasix 40 mg and metolazone 2.5 mg as well as the patient's lymph massage. She has been told she has both venous insufficiency and lymphedema but she cannot tolerate compression stockings 11/28/16; the patient saw Dr. Kellie Simmering recently. Per the patient he did arterial Dopplers in the office that did not show evidence of arterial insufficiency, per the patient he stated "treat this like an ordinary venous ulcer". She also saw her dermatologist Dr. Ronnald Ramp who felt that this was more of a vascular ulcer. In general things are improving although she arrives today with increasing bilateral lower extremity edema with weeping a deeper fluid through the wound on the left medial leg compatible with some degree of lymphedema 12/04/16; the patient's wound is fully epithelialized but I don't think fully healed. We will do another week of depression with Promogran and TCA however I suspect we'll be able to discharge her next week. This is a very unusual-looking wound which was initially a figure-of-eight type wound lying on its side surrounded by petechial like hemorrhage. She has had venous ablation on this side. She apparently does not have an arterial issue per Dr. Kellie Simmering. She saw her dermatologist thought it was "vascular". Patient is definitely going to need ongoing compression and I talked about this with her today she will go to elastic therapy after she leaves here next week 12/11/16; the patient's wound is not completely closed today. She has surrounding scar tissue and in further discussion with the patient it would appear that she had ulcers in this area in 2009 for a prolonged period of time ultimately requiring a punch biopsy of this area that only showed venous insufficiency. I did not previously pickup on this part of the history from the patient. 12/18/16; the patient's wound is completely epithelialized. There is  no open area here. She has significant bilateral venous insufficiency with secondary lymphedema to a mild-to-moderate degree she does not have compression stockings.. She did not say anything to me when I was in the room, she told our intake nurse that she was still having pain in this area. This isn't unusual recurrent small open area. She is going to go to elastic therapy to obtain compression stockings. 12/25/16; the patient's wound is fully epithelialized. There is no open area here. The patient describes some continued episodic discomfort in this area medial left calf. However everything looks fine and healed here. She is been to elastic therapy and caught herself 15-20 mmHg stockings, they apparently were having trouble getting 20-30 mm stockings in her size 01/22/17; this is a patient we discharged from the clinic a month ago. She has a recurrent open wound on her medial left calf. She had 15 mm support stockings. I told her I thought she needed 20-30 mm compression stockings. She tells me that she has been ill with hospitalization secondary to asthma and is been found to have severe hypokalemia likely secondary to a combination of Lasix and metolazone. This  morning she noted blistering and leaking fluid on the posterior part of her left leg. She called our intake nurse urgently and we was saw her this afternoon. She has not had any real discomfort here. I don't know that she's been wearing any stockings on this leg for at least 2-3 days. ABIs in this clinic were 1.21 on the right and 1.3 on the left. She is previously seen vascular surgery who does not think that there is a peripheral arterial issue. 01/30/17; Patient arrives with no open wound on the left leg. She has been to elastic therapy and obtained 20-40mmhg below knee stockings and she has one on the right leg today. READMISSION 02/19/18; this Sailer is a now 74 year old patient we've had in this clinic perhaps 3 times before. I had last  looked at her from January 07 December 2016 with an area on the medial left leg. We discharged her on 12/25/16 however she had to be readmitted on 01/22/17 with a recurrence. I have in my notes that we discharged her on 20-30 mm stockings although she tells me she was only wearing support hose because she cannot get stockings on predominantly related to her cervical spine surgery/issues. She has had previous ablations done by vein and vascular in [place] including a great saphenous vein ablation on the left with an anterior accessory branch ablation I think both of these were in 2016. On one of the previous visit she had a biopsy noted 2009 that was negative. She is not felt to have an arterial issue. She is not a diabetic. She does have a history of obstructive sleep apnea hypertension asthma as well as chronic venous insufficiency and lymphedema. On this occasion she noted 2 dry scaly patch on her left leg. She tried to put lotion on this it didn't really help. There were 2 open areas.the patient has been seeing her primary physician from 02/05/18 through 02/14/18. She had Unna boots applied. The superior wound now on the lateral left leg has closed but she's had one wound that remains open on the lateral left leg. This is not the same spot as we dealt with in 2018. ABIs in this clinic were 1.3 bilaterally [name], [name] ([id number]) 02/26/18; patient has a small wound on the left lateral calf. Dimensions are down. She has chronic venous insufficiency and lymphedema. 03/05/18; small open area on the left lateral calf. Dimensions are down. Tightly adherent necrotic debris over the surface of the wound which was difficult to remove. Also the dressing [over collagen] stuck to the wound surface. This was removed with some difficulty as well. Change the primary dressing to Hydrofera Blue ready 03/12/18; small open area on the left lateral calf. Comes in with tightly adherent surface eschar as well as some  adherent Hydrofera Blue. 03/19/18; open area on the left lateral calf. Again adherent surface eschar as well as some adherent Hydrofera Blue nonviable subcutaneous tissue. She complained of pain all week even with the reduction from 4-3 layer compression I put on last week. Also she had an increase in her ankle and calf measurements probably related to the same thing. 03/26/18; open area on the left lateral calf. A very small open area remains here. We used silver alginate starting last week as the Hydrofera Blue seem to stick to the wound bed. In using 4-layer compression 04/02/18; the open area in the left lateral calf at some adherent slough which I removed there is no open area here. We are able to  transition her into her own compression stocking. Truthfully I think this is probably his support hose. However this does not maintain skin integrity will be limited. She cannot put over the toe compression stockings on because of neck problems hand problems etc. She is allergic to the lining layer of juxta lites. We might be forced to use extremitease stocking should this fail READMIT 11/24/2018 Patient is now a 74 year old woman who is not a diabetic. She has been in this clinic on at least 3 previous occasions largely with recurrent wounds on her left leg secondary to chronic venous insufficiency with secondary lymphedema. Her situation is complicated by inability to get stockings on and an allergy to neoprene which is apparently a component and at least juxta lites and other stockings. As a result she really has not been wearing any stockings on her legs. She tells [place] that roughly 2 or 3 weeks ago she started noticing a stinging sensation just above her ankle on the left medial aspect. She has been diagnosed with pseudogout and she wondered whether this was what she was experiencing. She tried to dress this with something she bought at the store however subsequently it pulled skin off and now she has  an open wound that is not improving. She has been using Vaseline gauze with a cover bandage. She saw her primary doctor last week who put an Haematologist on her. ABIs in this clinic was 1.03 on the left 2/12; the area is on the left medial ankle. Odd-looking wound with what looks to be surface epithelialization but a multitude of small petechial openings. This clearly not closed yet. We have been using silver alginate under 3 layer compression with TCA 2/19; the wound area did not look quite as good this week. Necrotic debris over the majority of the wound surface which required debridement. She continues to have a multitude of what looked to be small petechial openings. She reminds [place] that she had a biopsy on this initially during her first outbreak in 2015 in [place] dermatology. She expresses concern about this being a possible melanoma. 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 I assured her that this does not look anything like melanoma. Except for the petechial reaction it does look like a venous insufficiency area and she certainly has evidence of this on both sides 2/26; a difficult area on the left medial ankle. The patient clearly has chronic venous hypertension with some degree of lymphedema. The odd thing about the area is the small petechial hemorrhages. I am not really sure how to explain this. This was present last time and this is not a compression injury. We have been using Hydrofera Blue which I changed to last week 3/4; still using Hydrofera Blue. Aggressive debridement today. She does not have known arterial issues. She has seen Dr. Kellie Simmering at [facility] vein and vascular and and has an ablation on the left. [Anterior accessory branch of the greater saphenous]. From what I remember they did not feel she had an arterial issue. The patient has had this area biopsied in 2009 at [facility]  dermatology and by her recollection they said this was "stasis". She is also follow-up with dermatology locally who thought that this was more of a vascular issue 3/11; using Hydrofera Blue. Aggressive debridement today. She does not have an arterial issue. We are using 3 layer compression although we may need to go to 4. The  patient has been in for multiple changes to her wrap since I last saw her a week ago. She says that the area was leaking. I do not have too much more information on what was found 01/19/19 on evaluation today patient was actually being seen for a nurse visit when unfortunately she had the area on her left lateral lower extremity as well as weeping from the right lower extremity that became apparent. Therefore we did end up actually seeing her for a full visit with myself. She is having some pain at this site as well but fortunately nothing too significant at this point. No fevers, chills, nausea, or vomiting noted at this time. 3/18-Patient is back to the clinic with the left leg venous leg ulcer, the ulcer is larger in size, has a surface that is densely adherent with fibrinous tissue, the Hydrofera Blue was used but is densely adherent and there was difficulty in removing it. The right lower extremity was also wrapped for weeping edema. Patient has a new area over the left lateral foot above the malleolus that is small and appears to have no debris with intact surrounding skin. Patient is on increased dose of Lasix also as a means to edema management 3/25; the patient has a nonhealing venous ulcer on the medial left leg and last week developed a smaller area on the lateral left calf. We have been using Hydrofera Blue with a contact layer. 4/1; no major change in these wounds areas. Left medial and more recently left lateral calf. I tried Iodoflex last week to aid in debridement she did not tolerate this. She stated her pain was terrible all week. She took the top layer of the 4  layer compression off. 4/8; the patient actually looks somewhat better in terms of her more prominent left lateral calf wound. There is some healthy looking tissue here. She is still complaining of a lot of discomfort. 4/15; patient in a lot of pain secondary to sciatica. She is on a prednisone taper prescribed by her primary physician. She has the 2 areas one on the left medial and more recently a smaller area on the left lateral calf. Both of these just above the malleoli 4/22; her back pain is better but she still states she is very uncomfortable and now feels she is intolerant to the [employer]. No real change in the wounds we have been using Sorbact. She has been previously intolerant to Iodoflex. There is not a lot of option about what we can use to debride this wound under compression that she no doubt needs. sHe states Ultram no longer works for her pain 4/29; no major change in the wounds slightly increased depth. Surface on the original medial wound perhaps somewhat improved however the more recent area on the lateral left ankle is 100% covered in very adherent debris we have been using Sorbact. She tolerates 4 layer compression well and her edema control is a lot better. She has not had to come in for a nurse check 5/6; no major change in the condition of the wounds. She did consent to debridement today which was done with some difficulty. Continuing Sorbact. She did not tolerate Iodoflex. She was in for a check of her compression the day after we wrapped her last week this was adjusted but nothing much was found 5/13; no major change in the condition or area of the wounds. I was able to get a fairly aggressive debridement done on the lateral left leg wound.  Even using Sorbact under compression. She came back in on Friday to have the wrap changed. She says she felt uncomfortable on the lateral aspect of her ankle. She has a long history of chronic venous insufficiency including previous  ablation surgery on this side. 5/20-Patient returns for wounds on left leg with both wounds covered in slough, with the lateral leg wound larger in size, she has been in 3 layer compression and felt more comfortable, she describes pain in ankle, in leg and pins and [name] in foot, and is about to try Pamelor for this 6/3; wounds on the left lateral and left medial leg. The area medially which is the most recent of the 2 seems to have had the largest increase in [name], [name]. ([id number]) dimensions. We have been using Sorbac to try and debride the surface. She has been to see orthopedics they apparently did a plain x-ray that was indeterminant. Diagnosed her with neuropathy and they have ordered an MRI to determine if there is underlying osteomyelitis. This was not high on my thought list but I suppose it is prudent. We have advised her to make an appointment with vein and vascular in [place]. She has a history of a left greater saphenous and accessory vein ablations I wonder if there is anything else that can be done from a surgical point of view to help in these difficult refractory wounds. We have previously healed this wound on one occasion but it keeps on reopening [medial side] 6/10; deep tissue culture I did last week I think on the left medial wound showed both moderate E. coli and moderate staph aureus [MSSA]. She is going to require antibiotics and I have chosen Augmentin. We have been using Sorbact and we have made better looking wound surface on both sides but certainly no improvement in wound area. She was back in last Friday apparently for a dressing changes the wrap was hurting her outer left ankle. She has not managed to get a hold of vein and vascular in [place]. We are going to have to make her that appointment 6/17; patient is tolerating the Augmentin. She had an MRI that I think was ordered by orthopedic surgeon this did not show osteomyelitis or an abscess did suggest  cellulitis. We have been using Sorbact to the lateral and medial ankles. We have been trying to arrange a follow-up appointment with vein and vascular in [place] or did her original ablations. We apparently an area sent the request to vein and vascular in [facility] 6/24; patient has completed the Augmentin. We do not yet have a vein and vascular appointment in [place]. I am not sure what the issue is here we have asked her to call tomorrow. We are using Sorbact. Making some improvements and especially the medial wound. Both surfaces however look better medial and lateral. 7/1; the patient has been in contact with vein and vascular in [place] but has not yet received an appointment. Using Sorbact we have gradually improve the wound surface with no improvement in surface area. She is approved for Apligraf but the wound surface still is not completely viable. She has not had to come in for a dressing change 7/8; the patient has an appointment with vein and vascular on 7/31 which is a Friday afternoon. She is concerned about getting back here for [place] to dress her wounds. I think it is important to have them goal for her venous reflux/history of ablations etc. to see if anything else can be done.  She apparently tested positive for 1 of the blood tests with regards to lupus and saw a rheumatologist. He has raised the issue of vasculitis again. I have had this thought in the past however the evidence seems overwhelming that this is a venous reflux etiology. If the rheumatologist tells me there is clinical and laboratory investigation is positive for lupus I will rethink this. 7/15; the patient's wound surfaces are quite a bit better. The medial area which was her original wound now has no depth although the lateral wound which was the more recent area actually appears larger. Both with viable surfaces which is indeed better. Using Sorbact. I wanted to use Apligraf on her however there is the issue  of the vein and vascular appointment on 7/31 at 2:00 in the afternoon which would not allow her to get back to be rewrapped and they would no doubt remove the graft 7/22; the patient's wound surfaces have moderate amount of debris although generally look better. The lateral one is larger with 2 small satellite areas superiorly. We are waiting for her vein and vascular appointment on 7/31. She has been approved for Apligraf which I would like to use after th 7/29; wound surfaces have improved no debridement is required we have been using Sorbact. She sees vein and vascular on Friday with this so question of whether anything can be done to lessen the likelihood of recurrence and/or speed the healing of these areas. She is already had previous ablations. She no doubt has severe venous hypertension 8/5-Patient returns at 1 week, she was in [place] for 3 days by her podiatrist, we have been using so backed to the wound, she has increased pain in both the wounds on the left lower leg especially the more distal one on the lateral aspect 8/12-Patient returns at 1 week and she is agreeable to having debridement in both wounds on her left leg today. We have been using Sorbact, and vascular studies were reviewed at last visit 8/19; the patient arrives with her wounds fairly clean and no debridement is required. We have used Sorbact which is really done a nice job in cleaning up these very difficult wound surfaces. The patient saw Dr. Donzetta Matters of vascular surgery on 7/31. He did not feel that there was an arterial component. He felt that her treated greater saphenous vein is adequately addressed and that the small saphenous vein did not appear to be involved significantly. She was also noted to have deep venous reflux which is not treatable. Dr. Donzetta Matters mentioned the possibility of a central obstructive component leading to reflux and he offered her central venography. She wanted to discuss this or think about it. I  have urged her to go ahead with this. She has had recurrent difficult wounds in these areas which do heal but after months in the clinic. If there is anything that can be done to reduce the likelihood of this I think it is worth it. 9/2 she is still working towards getting follow-up with Dr. Donzetta Matters to schedule her CT. Things are quite a bit worse venography. I put Apligraf on 2 weeks ago on both wounds on the medial and lateral part of her left lower leg. She arrives in clinic today with 3 superficial additional wounds above the area laterally and one below the wound medially. She describes a lot of discomfort. I think these are probably wrapped injuries. Does not look like she has cellulitis. 07/20/2019 on evaluation today patient appears to be doing  somewhat poorly in regard to her lower extremity ulcers. She in fact showed signs of erythema in fact we may even be dealing with an infection at this time. Unfortunately I am unsure if this is just infection or if indeed there may be some allergic reaction that occurred as a result of the Apligraf application. With that being said that would be unusual but nonetheless not impossible in this patient is one who is unfortunately allergic to quite a bit. Currently we have been using the Sorbact which seems to do as well as anything for her. I do think we may want to obtain a culture today to see if there is anything showing up there that may need to be addressed. 9/16; noted that last week the wounds look worse in 1 week follow-up of the Apligraf. Using Sorbact as of 2 days ago. She arrives with copious amounts of drainage and new skin breakdown on the back of the left calf. The wounds arm more substantial bilaterally. There is a fair amount of swelling in the left calf no overt DVT there is edema present I think in the left greater than right thigh. She is supposed to go on 9/28 for CT venography. The wounds on the medial and lateral calf are worse and she  has new skin breakdown posteriorly at least new for me. This is almost developing into a circumferential wound area The Apligraf was taken off last week which I agree with things are not going in the right direction a culture was done we do not have that back yet. She is on Augmentin that she started 2 days ago 9/23; dressing was changed by her nurses on Monday. In general there is no improvement in the wound areas although the area looks less angry than last week. She did get Augmentin for MSSA cultured on the 14th. She still appears to have too much swelling in the left leg even with 3 layer compression 9/30; the patient underwent her procedure on 9/28 by Dr. Donzetta Matters at vascular and vein specialist. She was discovered to have the common iliac vein measuring 12.2 mm but at the level of L4-L5 measured 3 mm. After stenting it measured 10 mm. 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 Rouleaux flow in the common femoral and femoral vein was observed much improved after stenting. We are using silver alginate to the wounds on the medial and lateral ankle on the left. 4 layer compression 10/7; the patient had fluid swelling around her knee and 4 layer compression. At the advice of vein and vascular this was reduced to 3 layer which she is tolerating better. We have been using silver alginate under 3 layer compression since last Friday 10/14; arrives with the areas on the left ankle looking a lot better. Inflammation in the area also a lot better. She came in for a nurse check on 10/9 10/21; continued nice improvement. Slight improvements in surface area of both the medial and lateral wounds on the left. A lot of the satellite lesions in the weeping erythema around these from stasis dermatitis is resolved. We have been using silver alginate [name], [name] ([id number]) 10/28; general improvement in the entire wound areas although not a lot of change in dimensions the wound certainly looks  better. There is a lot less in terms of venous inflammation. Continue silver alginate this week however look towards Hydrofera Blue next week 11/4; very adherent debris on the medial wound left wound is not as  bad. We have been using silver alginate. Change to [facility] today 11/11; very adherent debris on both wound areas. She went to vein and vascular last week and follow-up they put in [place] boot on this today. He says the [facility] was adherent. Wound is definitely not as good as last week. Especially on the left there the satellite lesions look more prominent 11/18; absolutely no better. erythema on lateral aspect with tenderness. 09/30/2019 on evaluation today patient appears to actually be doing better. Dr. Dellia Nims did put her on doxycycline last week which I do believe has helped her at this point. Fortunately there is no signs of active infection at this time. No fevers, chills, nausea, vomiting, or diarrhea. I do believe he may want extend the doxycycline for 7 additional days just to ensure everything does completely cleared up the patient is in agreement with that plan. Otherwise she is going require some sharp debridement today 12/2; patient is completing a 2-week course of doxycycline. I gave her this empirically for inflammation as well as infection when I last saw her 2 weeks ago. All of this seems to be better. She is using silver alginate she has the area on the medial aspect of the larger area laterally and the 2 small satellite regions laterally above the major wound. 12/9; the patient's wound on the left medial and left lateral calf look really quite good. We have been using silver alginate. She saw vein and vascular in follow-up on 10/09/2019. She has had a previous left greater saphenous vein ablation by Dr. Oscar La in 2016. More recently she underwent a left common iliac vein stent by Dr. 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 The swelling is improved and  certainly the wounds have improved. The patient shows [place] today area on the right medial calf there is almost no wound but leaking lymphedema. She says she start this started 3 or 4 days ago. She did not traumatize it. It is not painful. She does not wear compression on that side 12/16; the patient continues to do well laterally. Medially still requiring debridement. The area on the right calf did not materialize to anything and is not currently open. We wrapped this last time. She has support stockings for that leg although I am not sure they are going to provide adequate compression 12/23; the lateral wound looks stable. Medially still requiring debridement for tightly adherent fibrinous debris. We've been using silver alginate. Surface area not any different 12/30; neither wound is any better with regards to surface and the area on the left lateral is larger. I been using silver alginate to the left lateral which look quite good last week and Sorbact to the left medial 11/11/2019. Lateral wound area actually looks better and somewhat smaller. Medial still requires a very aggressive debridement today. We have been using Sorbact on both wound areas 1/13; not much better still adherent debris bilaterally. I been using Sorbact. She has severe venous hypertension. Probably some degree of dermal fibrosis distally. I wonder whether tighter compression might help and I am going to try that today. We also need to work on the bioburden 1/20; using Sorbact. She has severe venous hypertension status post stent placement for pelvic vein compression. We applied gentamicin last time to see if we could reduce bioburden I had some discussion with her today about the use of pentoxifylline. This is occasionally used in this setting for wounds with refractory venous insufficiency. However this interacts with Plavix. She  tells me that she was put on this after stent placement for 3 months. She will call Dr. Claretha Cooper  office to discuss 1/27; we are using gentamicin under Sorbact. 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 She has a stent. Wound medially is measuring smaller this week. Laterally measuring slightly larger although she has some satellite lesions superiorly 2/3; gentamicin under Sorbact under 4-layer compression. 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 She has a stent on Plavix. Her wounds are measuring smaller this week. More substantially laterally where there is a satellite lesion superiorly. 2/10; gentamicin under Sorbac. 4-layer compression. Patient communicated with Dr. Donzetta Matters at [facility] in [place]. He is okay with the patient coming off Plavix I will therefore start her on pentoxifylline for a 1 month trial. In general her wounds look better today. I had some concerns about swelling in the left thigh however she measures 61.5 on the right and 63 on the mid thigh which does not suggest there is any difficulty. The patient is not describing any pain. 2/17; gentamicin under Sorbac 4-layer compression. She has been on pentoxifylline for 1 week and complains of loose stool. No nausea she is eating and drinking well 2/24; the patient apparently came in 2 days ago for a nurse visit when her wrap fell down. Both areas look a little worse this week macerated medially and satellite lesions laterally. Change to silver alginate today 3/3; wounds are larger today especially medially. She also has more swelling in her foot lower leg and I even noted some swelling in her posterior thigh which is tender. I wonder about the patency of her stent. Fortuitously she sees Dr. Claretha Cooper group on Friday 3/10; Mrs. Atilano was seen by [facility] on 3/5. The patient underwent ultrasound. There was no evidence of thrombosis involving the IVC no evidence of thrombosis involving the right common iliac vein there is no evidence of thrombosis involving the  right external iliac vein the left external vein is also patent. The right common iliac vein stent appears patent bilateral common femoral veins are compressible and appear patent. I was concerned about the left common iliac stent however it looks like this is functional. She has some edema in the posterior thigh that was tender she still has that this week. I also note they had trouble finding the pulses in her left foot and booked her for an ABI baseline in 4 weeks. She will follow up in 6 months for repeat IVC duplex. The patient stopped the pentoxifylline because of diarrhea. It does not look like that was being effective in any case. I have advised her to go back on her aspirin 81 mg tablet, vascular it also suggested this 3/17; comes in today with her wound surfaces a lot better. The excoriations from last week considerably better probably secondary to the TCA. We have been using silver alginate 3/24; comes in today with smaller wounds both medially and laterally. Both required debridement. There are 2 small satellite areas superiorly laterally. She also has a very odd bandlike area in the mid calf almost looking like there was a weakness in the wrap in a localized area. I would write this off as being this however anteriorly she has a small raised ballotable area that is very tender almost reminiscent of an abscess but there was no obvious purulent surface to it. 02/04/20 upon evaluation today patient appears to be doing fairly well in regard to her wounds today. Fortunately there  is no signs of active infection at this time. No fevers, chills, nausea, vomiting, or diarrhea. She has been tolerating the dressing changes without complication. Fortunately I feel like she is showing signs of improvement although has been sometime since have seen her. Nonetheless the area of concern that Dr. Dellia Nims had last week where she had possibly an area of the wrap that was we can allow the leg to bulge  appears to be doing significantly better today there is no signs of anything worsening. [name], [name] ([id number]) 4/7; the patient's wounds on her medial and lateral left leg continue to contract. We have been using a regular alginate. Last week she developed an area on the right medial lower leg which is probably a venous ulcer as well. 4/14; the wounds on her left medial and lateral lower leg continue to contract. Surface eschar. We have been using regular alginate. The area on the right medial lower leg is closed. We have been putting both legs under 4-layer contraction. The patient went back to see vein and vascular she had arterial studies done which were apparently "quite good" per the patient although I have not read their notes I have never felt she had an arterial issue. 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 This is been longstanding and refractory to exercise, leg elevation and longstanding use of compression wraps in our clinic as well as compression stockings on the times we have been able to get these to heal 4/21; we thought she actually might be close this week however she arrives in clinic with a lot of edema in her upper left calf and into her posterior thigh. This is been an intermittent problem here. She says the wrap fell down but it was replaced with a nurse visit on Monday. We are using calcium alginate to the wounds and the wound sizes there not terribly larger than last week but there is a lot more edema 4/28; again wound edges are smaller on both sides. Her edema is better controlled than last time. She is obtained her compression pumps from [employer] although they have not been to her home to set these up. 5/5; left medial and left lateral both look stable. I am not sure the medial is any smaller. We have been using calcium alginate under 4-layer compression. She had an area on the right medial. This was  eschared today. We have been wrapping this as well. She does not tolerate external compression stockings due to a history of various contact allergies. She has her compression pumps however the representative from the company is coming on her to show her how to use these tomorrow 5/19; patient with severe chronic venous insufficiency secondary to central venous disease. She had a stent placed in her left common iliac vein. She has done better since but still difficult to control wounds. She comes in today with nothing open on the right leg. Her areas on the left medial and left lateral are just about closed. We are using calcium alginate under 4-layer compression. She is using her external compression pumps at home She only has 15-20 support stockings. States she cannot get anything tighter than that on. 03/30/20-Patient returns at 1 week, the wounds on the left leg are both slightly bigger, the last week she was on 3 layer compression which started to slide down. She is starting to use her lymphedema pumps although she stated on 1 day her right ankle started to swell up  and she have to stop that day. Unfortunately the open area seem to oscillate between improving to the point of healing and then flaring up all to do with effectiveness of compression or lack of due to the left leg topography not keeping the compression wraps from rolling down 6/2 patient comes in with a 15/20 mmHg stocking on the right leg. She tells me that she developed a lot of swelling in her ankles she saw orthopedics she was felt to possibly be having a flare of pseudogout versus some other type of arthritis. She was put on steroids for a respiratory issue so that helps with the inflammation. She has not been using the pumps all week. She thinks the left thigh is more swollen than usual and I would agree with that. She has an appointment with Dr. Donzetta Matters 9 days or so from now 6/9; both wounds on the left medial and left lateral  are smaller. We have been using calcium alginate under compression. She does not have an open wound on the right leg she is using a stocking and her compression pumps things are going well. She has an appointment with Dr. Donzetta Matters with regards to her stent in the left common iliac vein 6/16; the wounds on the left medial and left lateral ankle continues to contract. The patient saw Dr. Donzetta Matters and I think he seems satisfied. Ordered follow-up venous reflux studies on both sides in September. Cautioned that she may need thigh-high stockings. She has been using calcium alginate under compression on the left and her own stocking on the right leg. She tells [place] there are no open wounds on the right 6/23; left lateral is just about closed. Medial required debridement today. We have been using calcium alginate. Extensive discussion about the compression pumps she is only using these on 25 mmHg states she could not take 40 or 30 when the wrap came out to her home to demonstrate these. He said they should not feel tight 6/30; the left lateral wound has a slight amount of eschar. . The area medially is about the same using Hydrofera Blue. 7/7; left lateral wound still has some eschar. I will remove this next week may be closed. The area medially is very small using Hydrofera Blue with improvement. Unfortunately the stockings fell down. Unfortunately the blisters have developed at the edge of where the wrap fell. When this happened she says her legs hurt she did not use her pumps. We are not open Monday for her to come in and change the wraps and she had an appointment yesterday. She also tells me that she is going to have an MRI of her back. She is having pain radiating into her left anterior leg she thinks her from an L5 disc. She saw Dr. Ellene Route of neurosurgery 7/14; the area on the left lateral ankle area is closed. Still a small area medially however it looks better as well. We have been using Hydrofera Blue  under 4-layer compression Objective Constitutional Patient is hypertensive.. Pulse regular and within target range for patient.Marland Kitchen Respirations regular, non-labored and within target range.. Temperature is normal and within the target range for the patient.Marland Kitchen appears in no distress. Vitals Time Taken: 3:45 PM, Height: 63 in, Weight: 224.7 lbs, BMI: 39.8, Temperature: 98.3 F, Pulse: 79 bpm, Respiratory Rate: 18 breaths/min, Blood Pressure: 144/69 mmHg. Cardiovascular Pedal pulses are palpable on the left. The patient has a considerable amount of localized swelling in the left leg from the mid calf towards  her knee. This is pitting not particularly warm or erythematous.Marland Kitchen [name], DIN ([id number]) General Notes: Wound exam; surface required debridement with a #3 curette including skin and necrotic debris over the wound surface. Hemostasis with direct pressure. Integumentary (Hair, Skin) Wound #5 status is Open. Original cause of wound was Gradually Appeared. The wound is located on the Left,Medial Lower Leg. The wound measures 1.5cm length x 1.2cm width x 0.1cm depth; 1.414cm^2 area and 0.141cm^3 volume. There is Fat Layer (Subcutaneous Tissue) Exposed exposed. There is no tunneling or undermining noted. There is a medium amount of serosanguineous drainage noted. There is small (1-33%) granulation within the wound bed. There is a large (67-100%) amount of necrotic tissue within the wound bed including Adherent Slough. Wound #6 status is Healed - Epithelialized. Original cause of wound was Gradually Appeared. The wound is located on the Left,Lateral Lower Leg. The wound measures 0cm length x 0cm width x 0cm depth; 0cm^2 area and 0cm^3 volume. There is no tunneling or undermining noted. There is a none present amount of drainage noted. There is no granulation within the wound bed. There is no necrotic tissue within the wound bed. 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 There was a Four Layer Compression Therapy Procedure with a pre-treatment ABI of 1 by Cornell Barman, RN. Post procedure Diagnosis Wound #5: Same as Pre-Procedure Plan Anesthetic (add to Medication List): Wound #5 Left,Medial Lower Leg: Topical Lidocaine 4% cream applied to wound bed prior to debridement (In Clinic Only). Primary Wound Dressing: Wound #5 Left,Medial Lower Leg: Hydrafera Blue Ready Transfer Secondary Dressing: Wound #5 Left,Medial Lower Leg: ABD pad Dressing Change Frequency: Wound #5 Left,Medial Lower Leg: Change dressing every week Other: - Nurse visit Friday and Monday if needed. Follow-up Appointments: Wound #5 Left,Medial Lower Leg: Return Appointment in 1 week. Nurse Visit as needed Edema Control: Wound #5 Left,Medial Lower Leg: 4-Layer Compression System - Left Lower Extremity. Patient to wear own compression stockings - On Right Compression Pump: Use compression pump on left lower extremity for 60 minutes, twice daily. Compression Pump: Use compression pump on right lower extremity for 60 minutes, twice daily. YULIA, ULRICH ([id number]) #1 I continue with Hydrofera Blue to the small remaining wound on the left medial lower leg 2. The localized area of swelling on the left leg was probably a wrap issue. It does not extend above her knee this is happened before. I have also asked her to increase her pump usage to twice a day. As the edema does not go excessively into the left thigh I do not think this is her stent occluded. I  will look at this next week if this is still present she will need a duplex ultrasound of her leg Electronic Signature(s) Signed: 05/18/2020 4:21:24 PM By: Linton Ham MD Entered By: Linton Ham on 05/18/2020 16:20:11 Mcraney, [name] ([id number]) -------------------------------------------------------------------------------- SuperBill Details Patient Name: LAZETTE, ESTALA. Date of Service: 05/18/2020 Medical Record Number: [id number] Patient Account Number: [id number] Date of Birth/Sex: October 03, 1946 (74 y.o. 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

## 2020-05-19 NOTE — Progress Notes (Signed)
[name], [name] ([id number]) Visit Report for 05/18/2020 Arrival Information Details Patient Name: [name], [name]. Date of Service: 05/18/2020 3:30 PM Medical Record Number: [id number] Patient Account Number: [id number] Date of Birth/Sex: 05/17/1946 (74 y.o. F) Treating RN: [name] Primary Care [name]: [name] Other Clinician: Referring [name]: [name] Treating [name]/Extender: [name] in Treatment: 38 Visit Information History Since Last Visit Added or deleted any medications: No Patient Arrived: Ambulatory Any new allergies or adverse reactions: No Arrival Time: 15:48 Had a fall or experienced change in No Accompanied By: self activities of daily living that may affect Transfer Assistance: None risk of falls: Patient Identification Verified: Yes Signs or symptoms of abuse/neglect since last visito No Secondary Verification Process Completed: Yes Hospitalized since last visit: No Patient Requires Transmission-Based No Implantable device outside of the clinic excluding No Precautions: cellular tissue based products placed in the center Patient Has Alerts: Yes since last visit: Patient Alerts: Patient on Blood Has Dressing in Place as Prescribed: Yes Thinner Has Compression in Place as Prescribed: Yes aspirin 81 Pain Present Now: No Electronic Signature(s) Signed: 05/18/2020 5:06:28 PM By: [name] RCP, RRT, CHT Entered By: [name] on 05/18/2020 15:48:49 Maggi, [name] ([id number]) -------------------------------------------------------------------------------- Compression Therapy Details Patient Name: [name], [name]. Date of Service: 05/18/2020 3:30 PM Medical Record Number: [id number] Patient Account Number: [id number] Date of Birth/Sex: 09/21/1946 (74 y.o. F) Treating RN: [name] Primary Care [name]: [name] Other Clinician: Referring Kensley Valladares: [name] Treating  [name]/Extender: [name] in Treatment: 75 Compression Therapy Performed for Wound Assessment: Wound #5 Left,Medial Lower Leg Performed By: Clinician Amber Barman, RN Compression Type: Four Layer Pre Treatment ABI: 1 Post Procedure Diagnosis Same as Pre-procedure Electronic Signature(s) Signed: 05/18/2020 6:25:04 PM By: [name], [name], RN, CWS, [name], [name] Entered By: [name], [name], RN, CWS, [name] on 05/18/2020 [place], [place]. ([id number]) -------------------------------------------------------------------------------- Encounter Discharge Information Details Patient Name: [name], [name]. Date of Service: 05/18/2020 3:30 PM Medical Record Number: [id number] Patient Account Number: [id number] Date of Birth/Sex: 06/27/1946 (74 y.o. F) Treating RN: [name] Primary Care [name]: [name] Other Clinician: Referring [name]: [name] Treating [name]/Extender: [name] in Treatment: 61 Encounter Discharge Information Items Discharge Condition: Stable Ambulatory Status: Ambulatory Discharge Destination: Home Transportation: Private Auto Accompanied By: self Schedule Follow-up Appointment: Yes Clinical Summary of Care: Electronic Signature(s) Signed: 05/18/2020 6:25:04 PM By: [name], [name], RN, CWS, [name], [name] Entered By: [name], [name], RN, CWS, [name] on 05/18/2020 16:10:31 [name] ([id number]) -------------------------------------------------------------------------------- Lower Extremity Assessment Details Patient Name: [name], [name]. Date of Service: 05/18/2020 3:30 PM Medical Record Number: [id number] Patient Account Number: [id number] Date of Birth/Sex: April 28, 1946 (74 y.o. F) Treating RN: [name] Primary Care [name]: [name] Other Clinician: Referring [name]: [name] Treating [name]/Extender: [name] in Treatment: 75 Edema Assessment Assessed: [Left: No] [Right: No] Edema: [Left:  Ye] [Right: s] Calf Left: Right: Point of Measurement: 33 cm From Medial Instep 55 cm cm Ankle Left: Right: Point of Measurement: 10 cm From Medial Instep 33 cm cm Vascular Assessment Pulses: Dorsalis Pedis Palpable: [Left:Yes] Electronic Signature(s) Signed: 05/18/2020 4:08:07 PM By: [name] Entered By: [name] on 05/18/2020 15:55:26 Bilyeu, [name] ([id number]) -------------------------------------------------------------------------------- Multi Wound Chart Details Patient Name: [name], [name]. Date of Service: 05/18/2020 3:30 PM Medical Record Number: [id number] Patient Account Number: [id number] Date of Birth/Sex: 12/08/1945 (74 y.o. F) Treating RN: [name] Primary Care Alize Acy: [name] Other  Clinician: Referring [name]: [name] Treating [name]/Extender: [name] in Treatment: 75 Vital Signs Height(in): 63 Pulse(bpm): 22 Weight(lbs): 224.7 Blood Pressure(mmHg): 144/69 Body Mass Index(BMI): 40 Temperature(F): 98.3 Respiratory Rate(breaths/min): 18 Photos: [N/A:N/A] Wound Location: Left, Medial Lower Leg Left, Lateral Lower Leg N/A Wounding Event: Gradually Appeared Gradually Appeared N/A Primary Etiology: Lymphedema Venous Leg Ulcer N/A Comorbid History: Cataracts, Asthma, Sleep Apnea, Cataracts, Asthma, Sleep Apnea, N/A Deep Vein Thrombosis, Deep Vein Thrombosis, Hypertension, Peripheral Venous Hypertension, Peripheral Venous Disease, Osteoarthritis, Received Disease, Osteoarthritis, Received Chemotherapy, Received Radiation Chemotherapy, Received Radiation Date Acquired: 11/19/2018 01/19/2019 N/A Weeks of Treatment: 75 69 N/A Wound Status: Open Healed - Epithelialized N/A Measurements L x W x D (cm) 1.5x1.2x0.1 0x0x0 N/A Area (cm) : 1.414 0 N/A Volume (cm) : 0.141 0 N/A % Reduction in Area: 83.50% 100.00% N/A % Reduction in Volume: 83.50% 100.00% N/A Classification: Full Thickness Without Exposed Full  Thickness Without Exposed N/A Support Structures Support Structures Exudate Amount: Medium None Present N/A Exudate Type: Serosanguineous N/A N/A Exudate Color: red, brown N/A N/A Granulation Amount: Small (1-33%) None Present (0%) N/A Necrotic Amount: Large (67-100%) None Present (0%) N/A Exposed Structures: Fat Layer (Subcutaneous Tissue) Fascia: No N/A Exposed: Yes Fat Layer (Subcutaneous Tissue) Fascia: No Exposed: No Tendon: No Tendon: No Muscle: No Muscle: No Joint: No Joint: No Bone: No Bone: No Epithelialization: Small (1-33%) None N/A Procedures Performed: Compression Therapy N/A N/A Treatment Notes Wound #5 (Left, Medial Lower Leg) Notes adaptic, h. blue, ABD, 4 layer CHANAY, NUGENT ([id number]) Electronic Signature(s) Signed: 05/18/2020 4:21:24 PM By: Linton Ham MD Entered By: Linton Ham on 05/18/2020 16:14:19 Kugel, [name] ([id number]) -------------------------------------------------------------------------------- Multi-Disciplinary Care Plan Details Patient Name: [name], [name]. Date of Service: 05/18/2020 3:30 PM Medical Record Number: [id number] Patient Account Number: [id number] Date of Birth/Sex: [birth year] (74 y.o. F) Treating RN: [name] Primary Care Dejuan Elman: [name] Other Clinician: Referring Michaeljoseph Revolorio: [name] Treating Enzio Buchler/Extender: [name] in Treatment: 75 Active Inactive Medication Nursing Diagnoses: Knowledge deficit related to medication safety: actual or potential Goals: Patient/caregiver will demonstrate understanding of new oral/IV medications prescribed at the [facility] (topical prescriptions are covered under the skin breakdown problem) Date Initiated: 12/16/2019 Target Resolution Date: 01/13/2020 Goal Status: Active Interventions: Assess for medication contraindications each visit where new medications are prescribed Treatment Activities: New medication prescribed at [facility] :  12/16/2019 Notes: Soft Tissue Infection Nursing Diagnoses: Impaired tissue integrity Goals: Patient's soft tissue infection will resolve Date Initiated: 12/10/2018 Target Resolution Date: 01/09/2019 Goal Status: Active Interventions: Assess signs and symptoms of infection every visit Notes: Venous Leg Ulcer Nursing Diagnoses: Actual venous Insuffiency (use after diagnosis is confirmed) Goals: Patient will maintain optimal edema control Date Initiated: 12/10/2018 Target Resolution Date: 01/09/2019 Goal Status: Active Interventions: Assess peripheral edema status every visit. Treatment Activities: Therapeutic compression applied : 12/10/2018 Notes: Wound/Skin Impairment Nursing Diagnoses: [name], [name] ([id number]) Impaired tissue integrity Goals: Patient/caregiver will verbalize understanding of skin care regimen Date Initiated: 12/10/2018 Target Resolution Date: 01/09/2019 Goal Status: Active Interventions: Assess ulceration(s) every visit Treatment Activities: Topical wound management initiated : 12/10/2018 Notes: Electronic Signature(s) Signed: 05/18/2020 6:25:04 PM By: [name], [name], RN, CWS, [name], [name] Entered By: [name], [name], RN, CWS, [name] on 05/18/2020 16:06:04 [name], [name] ([id number]) -------------------------------------------------------------------------------- Pain Assessment Details Patient Name: [name], [name]. Date of Service: 05/18/2020 3:30 PM Medical Record Number: [id number] Patient Account Number: [id number] Date of Birth/Sex: 02/21/1946 (74 y.o. F) Treating RN: [name] Primary Care Adeline Petitfrere: [name]  Other Clinician: Referring Biran Mayberry: [name] Treating Kanoa Phillippi/Extender: [name] in Treatment: 75 Active Problems Location of Pain Severity and Description of Pain Patient Has Paino No Site Locations Pain Management and Medication Current Pain Management: Electronic Signature(s) Signed: 05/18/2020 4:08:07 PM By: [name] Entered By: [name] on 05/18/2020 15:51:29 Moeser, [name] ([id number]) -------------------------------------------------------------------------------- Patient/Caregiver Education Details Patient Name: [name], [name]. Date of Service: 05/18/2020 3:30 PM Medical Record Number: [id number] Patient Account Number: [id number] Date of Birth/Gender: [birth year] (74 y.o. F) Treating RN: [name] Primary Care Physician: [name] Other Clinician: Referring Physician: Ria [name] Treating Physician/Extender: [name] in Treatment: 95 Education Assessment Education Provided To: Patient Education Topics Provided Venous: Handouts: Controlling Swelling with Multilayered Compression Wraps Methods: Demonstration, Explain/Verbal Responses: State content correctly Wound/Skin Impairment: Handouts: Caring for Your Ulcer Methods: Demonstration, Explain/Verbal Responses: State content correctly Electronic Signature(s) Signed: 05/18/2020 6:25:04 PM By: [name], [name], RN, CWS, [name], [name] Entered By: [name], [name], RN, CWS, [name] on 05/18/2020 16:09:48 [name] ([id number]) -------------------------------------------------------------------------------- Wound Assessment Details Patient Name: [name], [name]. Date of Service: 05/18/2020 3:30 PM Medical Record Number: [id number] Patient Account Number: [id number] Date of Birth/Sex: 1946-08-22 (74 y.o. F) Treating RN: [name] Primary Care Kimorah Ridolfi: [name] Other Clinician: Referring Salvadore Valvano: [name] Treating Orest Dygert/Extender: [name] in Treatment: 75 Wound Status Wound Number: 5 Primary Lymphedema Etiology: Wound Location: Left, Medial Lower Leg Wound Open Wounding Event: Gradually Appeared Status: Date Acquired: 11/19/2018 Comorbid Cataracts, Asthma, Sleep Apnea, Deep Vein Thrombosis, Weeks Of Treatment: 75 History: Hypertension, Peripheral Venous Disease,  Osteoarthritis, Clustered Wound: No Received Chemotherapy, Received Radiation Photos Wound Measurements Length: (cm) 1.5 Width: (cm) 1.2 Depth: (cm) 0.1 Area: (cm) 1.414 Volume: (cm) 0.141 % Reduction in Area: 83.5% % Reduction in Volume: 83.5% Epithelialization: Small (1-33%) Tunneling: No Undermining: No Wound Description Classification: Full Thickness Without Exposed Support Struct Exudate Amount: Medium Exudate Type: Serosanguineous Exudate Color: red, brown ures Foul Odor After Cleansing: No Slough/Fibrino Yes Wound Bed Granulation Amount: Small (1-33%) Exposed Structure Necrotic Amount: Large (67-100%) Fascia Exposed: No Necrotic Quality: Adherent Slough Fat Layer (Subcutaneous Tissue) Exposed: Yes Tendon Exposed: No Muscle Exposed: No Joint Exposed: No Bone Exposed: No Treatment Notes Wound #5 (Left, Medial Lower Leg) Notes adaptic, h. blue, ABD, 4 layer Electronic Signature(s) Signed: 05/18/2020 4:08:07 PM By: Renee Rival ([id number]) Entered By: [name] on 05/18/2020 15:53:28 Spengler, [name] ([id number]) -------------------------------------------------------------------------------- Wound Assessment Details Patient Name: [name], [name]. Date of Service: 05/18/2020 3:30 PM Medical Record Number: [id number] Patient Account Number: [id number] Date of Birth/Sex: 06-03-1946 (74 y.o. F) Treating RN: [name] Primary Care Caroleann Casler: [name] Other Clinician: Referring Ardel Jagger: [name] Treating Airyonna [name]/Extender: [name] in Treatment: 75 Wound Status Wound Number: 6 Primary Venous Leg Ulcer Etiology: Wound Location: Left, Lateral Lower Leg Wound Healed - Epithelialized Wounding Event: Gradually Appeared Status: Date Acquired: 01/19/2019 Comorbid Cataracts, Asthma, Sleep Apnea, Deep Vein Thrombosis, Weeks Of Treatment: 69 History: Hypertension, Peripheral Venous Disease, Osteoarthritis, Clustered  Wound: No Received Chemotherapy, Received Radiation Photos Wound Measurements Length: (cm) 0 Width: (cm) 0 Depth: (cm) 0 Area: (cm) 0 Volume: (cm) 0 % Reduction in Area: 100% % Reduction in Volume: 100% Epithelialization: None Tunneling: No Undermining: No Wound Description Classification: Full Thickness Without Exposed Support Structure Exudate Amount: None Present s Wound Bed Granulation Amount: None Present (0%) Exposed Structure Necrotic Amount: None Present (0%) Fascia Exposed: No Fat Layer (Subcutaneous Tissue) Exposed: No  Tendon Exposed: No Muscle Exposed: No Joint Exposed: No Bone Exposed: No Electronic Signature(s) Signed: 05/18/2020 6:25:04 PM By: [name], [name], RN, CWS, [name], [name] Entered By: [name], [name], RN, CWS, [name] on 05/18/2020 16:05:40 TOTIANA, EVERSON ([id number]) -------------------------------------------------------------------------------- Vitals Details Patient Name: RUTHETTA, KOOPMANN. Date of Service: 05/18/2020 3:30 PM Medical Record Number: [id number] Patient Account Number: [id number] Date of Birth/Sex: [birth year] (74 y.o. F) Treating RN: [name] Primary Care Tallen Schnorr: [name] Other Clinician: Referring Jalik Gellatly: [name] Treating Everette Dimauro/Extender: [name] in Treatment: 75 Vital Signs Time Taken: 15:45 Temperature (F): 98.3 Height (in): 63 Pulse (bpm): 79 Weight (lbs): 224.7 Respiratory Rate (breaths/min): 18 Body Mass Index (BMI): 39.8 Blood Pressure (mmHg): 144/69 Reference Range: 80 - 120 mg / dl Electronic Signature(s) Signed: 05/18/2020 5:06:28 PM By: [name] RCP, RRT, CHT Entered By: [name] on 05/18/2020 15:49:16

## 2020-05-23 ENCOUNTER — Other Ambulatory Visit: Payer: Self-pay

## 2020-05-23 DIAGNOSIS — L97221 Non-pressure chronic ulcer of left calf limited to breakdown of skin: Secondary | ICD-10-CM | POA: Diagnosis not present

## 2020-05-23 DIAGNOSIS — I87332 Chronic venous hypertension (idiopathic) with ulcer and inflammation of left lower extremity: Secondary | ICD-10-CM | POA: Diagnosis not present

## 2020-05-23 DIAGNOSIS — I82592 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: Secondary | ICD-10-CM | POA: Diagnosis not present

## 2020-05-23 DIAGNOSIS — I83009 Varicose veins of unspecified lower extremity with ulcer of unspecified site: Secondary | ICD-10-CM | POA: Diagnosis not present

## 2020-05-23 DIAGNOSIS — I890 Lymphedema, not elsewhere classified: Secondary | ICD-10-CM | POA: Diagnosis not present

## 2020-05-23 NOTE — Progress Notes (Signed)
[name], [name] ([id number]) Visit Report for 05/23/2020 Arrival Information Details Patient Name: [name], [name]. Date of Service: 05/23/2020 1:45 PM Medical Record Number: [id number] Patient Account Number: [id number] Date of Birth/Sex: 1946/04/27 (74 y.o. F) Treating RN: Cornell Barman Primary Care Jodeci Rini: [name] Other Clinician: Referring Carey Lafon: [name] Treating Edell Mesenbrink/Extender: Melburn Hake, HOYT Weeks in Treatment: 81 Visit Information History Since Last Visit Added or deleted any medications: No Patient Arrived: Ambulatory Any new allergies or adverse reactions: No Arrival Time: 13:44 Had a fall or experienced change in No Accompanied By: self activities of daily living that may affect Transfer Assistance: None risk of falls: Patient Identification Verified: Yes Signs or symptoms of abuse/neglect since last visito No Secondary Verification Process Completed: Yes Hospitalized since last visit: No Patient Requires Transmission-Based No Implantable device outside of the clinic excluding No Precautions: cellular tissue based products placed in the center Patient Has Alerts: Yes since last visit: Patient Alerts: Patient on Blood Has Dressing in Place as Prescribed: Yes Thinner Pain Present Now: No aspirin 81 Electronic Signature(s) Signed: 05/23/2020 4:00:20 PM By: Sandre Kitty Entered By: Sandre Kitty on 05/23/2020 13:44:53 Paddack, [name] ([id number]) -------------------------------------------------------------------------------- Compression Therapy Details Patient Name: [name], [name]. Date of Service: 05/23/2020 1:45 PM Medical Record Number: [id number] Patient Account Number: [id number] Date of Birth/Sex: 12-Nov-1945 (74 y.o. F) Treating RN: Cornell Barman Primary Care Carrel Leather: [name] Other Clinician: Referring Luisalberto Beegle: [name] Treating Jenesa Foresta/Extender: Melburn Hake, HOYT Weeks in Treatment: 75 Compression Therapy  Performed for Wound Assessment: Wound #5 Left,Medial Lower Leg Performed By: Clinician Cornell Barman, RN Compression Type: Four Layer Pre Treatment ABI: 1 Electronic Signature(s) Signed: 05/23/2020 2:48:30 PM By: [name], [name], RN, CWS, [name], [name] Entered By: [name], [name], RN, CWS, Kim on 05/23/2020 14:48:30 [name] ([id number]) -------------------------------------------------------------------------------- Encounter Discharge Information Details Patient Name: [name], [name]. Date of Service: 05/23/2020 1:45 PM Medical Record Number: [id number] Patient Account Number: [id number] Date of Birth/Sex: 01/30/1946 (75 y.o. F) Treating RN: Cornell Barman Primary Care Pio Eatherly: [name] Other Clinician: Referring Ivyrose Hashman: [name] Treating Ellarae Nevitt/Extender: Melburn Hake, HOYT Weeks in Treatment: 7 Encounter Discharge Information Items Discharge Condition: Stable Ambulatory Status: Ambulatory Discharge Destination: Home Transportation: Private Auto Accompanied By: self Schedule Follow-up Appointment: No Clinical Summary of Care: Electronic Signature(s) Signed: 05/23/2020 4:02:27 PM By: [name], [name], RN, CWS, [name], [name] Entered By: [name], [name], RN, CWS, Kim on 05/23/2020 16:02:26 [name] ([id number]) -------------------------------------------------------------------------------- Wound Assessment Details Patient Name: [name], [name]. Date of Service: 05/23/2020 1:45 PM Medical Record Number: [id number] Patient Account Number: [id number] Date of Birth/Sex: 1946/07/10 (74 y.o. F) Treating RN: Cornell Barman Primary Care Annalese Stiner: [name] Other Clinician: Referring Brisia Schuermann: [name] Treating Deashia Soule/Extender: Melburn Hake, HOYT Weeks in Treatment: 75 Wound Status Wound Number: 5 Primary Etiology: Lymphedema Wound Location: Left, Medial Lower Leg Wound Status: Open Wounding Event: Gradually Appeared Date Acquired: 11/19/2018 Weeks Of Treatment:  75 Clustered Wound: No Wound Measurements Length: (cm) 1.5 Width: (cm) 1.2 Depth: (cm) 0.1 Area: (cm) 1.414 Volume: (cm) 0.141 % Reduction in Area: 83.5% % Reduction in Volume: 83.5% Wound Description Classification: Full Thickness Without Exposed Support Structure s Treatment Notes Wound #5 (Left, Medial Lower Leg) Notes h. blue, ABD, 4 layer unna to anchor Electronic Signature(s) Signed: 05/23/2020 4:00:20 PM By: Sandre Kitty Signed: 05/23/2020 5:16:05 PM By: [name], [name], RN, CWS, [name], [name] Entered By: Sandre Kitty on 05/23/2020 13:45:05

## 2020-05-25 ENCOUNTER — Encounter: Payer: Medicare Other | Admitting: Internal Medicine

## 2020-05-25 ENCOUNTER — Other Ambulatory Visit: Payer: Self-pay

## 2020-05-25 DIAGNOSIS — I890 Lymphedema, not elsewhere classified: Secondary | ICD-10-CM | POA: Diagnosis not present

## 2020-05-25 DIAGNOSIS — I83009 Varicose veins of unspecified lower extremity with ulcer of unspecified site: Secondary | ICD-10-CM | POA: Diagnosis not present

## 2020-05-25 DIAGNOSIS — L97221 Non-pressure chronic ulcer of left calf limited to breakdown of skin: Secondary | ICD-10-CM | POA: Diagnosis not present

## 2020-05-25 DIAGNOSIS — I82592 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: Secondary | ICD-10-CM | POA: Diagnosis not present

## 2020-05-25 DIAGNOSIS — I87332 Chronic venous hypertension (idiopathic) with ulcer and inflammation of left lower extremity: Secondary | ICD-10-CM | POA: Diagnosis not present

## 2020-05-27 ENCOUNTER — Other Ambulatory Visit: Payer: Self-pay

## 2020-05-27 DIAGNOSIS — I83009 Varicose veins of unspecified lower extremity with ulcer of unspecified site: Secondary | ICD-10-CM | POA: Diagnosis not present

## 2020-05-27 DIAGNOSIS — I890 Lymphedema, not elsewhere classified: Secondary | ICD-10-CM | POA: Diagnosis not present

## 2020-05-27 DIAGNOSIS — I82592 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: Secondary | ICD-10-CM | POA: Diagnosis not present

## 2020-05-27 DIAGNOSIS — L97221 Non-pressure chronic ulcer of left calf limited to breakdown of skin: Secondary | ICD-10-CM | POA: Diagnosis not present

## 2020-05-27 DIAGNOSIS — I87332 Chronic venous hypertension (idiopathic) with ulcer and inflammation of left lower extremity: Secondary | ICD-10-CM | POA: Diagnosis not present

## 2020-05-27 NOTE — Progress Notes (Signed)
[name], [name] ([id number]) Visit Report for 05/25/2020 HPI Details Patient Name: [name], [name]. Date of Service: 05/25/2020 12:30 PM Medical Record Number: [id number] Patient Account Number: [id number] Date of Birth/Sex: 10-Sep-1946 (74 y.o. F) Treating RN: Cornell Barman Primary Care Provider: [name] Other Clinician: Referring Provider: [name] Treating Provider/Extender: Tito Dine in Treatment: 44 History of Present Illness HPI Description: Pleasant 74 year old with history of chronic venous insufficiency. No diabetes or peripheral vascular disease. Left ABI 1.29. Questionable history of left lower extremity DVT. She developed a recurrent ulceration on her left lateral calf in December 2015, which she attributes to poor diet and subsequent lower extremity edema. She underwent endovenous laser ablation of her left greater saphenous vein in 2010. She underwent laser ablation of accessory branch of left GSV in April 2016 by Dr. Kellie Simmering at [facility]. She was previously wearing Unna boots, which she tolerated well. Tolerating 2 layer compression and cadexomer iodine. She returns to clinic for follow-up and is without new complaints. She denies any significant pain at this time. She reports persistent pain with pressure. No claudication or ischemic rest pain. No fever or chills. No drainage. READMISSION 11/13/16; this is a 74 year old woman who is not a diabetic. She is here for a review of a painful area on her left medial lower extremity. I note that she was seen here previously last year for wound I believe to be in the same area. At that time she had undergone previously a left greater saphenous vein ablation by Dr. Kellie Simmering and she had a ablation of the anterior accessory branch of the left greater saphenous vein in March 2016. Seeing that the wound actually closed over. In reviewing the history with her today the ulcer in this area has been recurrent. She describes  a biopsy of this area in 2009 that only showed stasis physiology. 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 Do not see that she has ever had arterial studies in the left leg. She tells me she has a follow-up with Dr. Kellie Simmering in roughly 10 days In any case she developed the reopening of this area roughly a month ago. On the background of this she describes rapidly increasing edema which has responded to Lasix 40 mg and metolazone 2.5 mg as well as the patient's lymph massage. She has been told she has both venous insufficiency and lymphedema but she cannot tolerate compression stockings 11/28/16; the patient saw Dr. Kellie Simmering recently. Per the patient he did arterial Dopplers in the office that did not show evidence of arterial insufficiency, per the patient he stated "treat this like an ordinary venous ulcer". She also saw her dermatologist Dr. Ronnald Ramp who felt that this was more of a vascular ulcer. In general things are improving although she arrives today with increasing bilateral lower extremity edema with weeping a deeper fluid through the wound on the left medial leg compatible with some degree of lymphedema 12/04/16; the patient's wound is fully epithelialized but I don't think fully healed. We will do another week of depression with Promogran and TCA however I suspect we'll be able to discharge her next week. This is a very unusual-looking wound which was initially a figure-of-eight type wound lying on its side surrounded by petechial like hemorrhage. She  has had venous ablation on this side. She apparently does not have an arterial issue per Dr. Kellie Simmering. She saw her dermatologist thought it was "vascular". Patient is definitely going to need ongoing compression and I talked about  this with her today she will go to elastic therapy after she leaves here next week 12/11/16; the patient's wound is not completely closed today. She has surrounding scar tissue and in further discussion with the patient it would appear that she had ulcers in this area in 2009 for a prolonged period of time ultimately requiring a punch biopsy of this area that only showed venous insufficiency. I did not previously pickup on this part of the history from the patient. 12/18/16; the patient's wound is completely epithelialized. There is no open area here. She has significant bilateral venous insufficiency with secondary lymphedema to a mild-to-moderate degree she does not have compression stockings.. She did not say anything to me when I was in the room, she told our intake nurse that she was still having pain in this area. This isn't unusual recurrent small open area. She is going to go to elastic therapy to obtain compression stockings. 12/25/16; the patient's wound is fully epithelialized. There is no open area here. The patient describes some continued episodic discomfort in this area medial left calf. However everything looks fine and healed here. She is been to elastic therapy and caught herself 15-20 mmHg stockings, they apparently were having trouble getting 20-30 mm stockings in her size 01/22/17; this is a patient we discharged from the clinic a month ago. She has a recurrent open wound on her medial left calf. She had 15 mm support stockings. I told her I thought she needed 20-30 mm compression stockings. She tells me that she has been ill with hospitalization secondary to asthma and is been found to have severe hypokalemia likely secondary to a combination of Lasix and metolazone. This morning she noted blistering and leaking fluid on the posterior part of her left leg. She called our intake nurse urgently and we was saw her this afternoon. She has not had any real discomfort here. I don't know  that she's been wearing any stockings on this leg for at least 2-3 days. ABIs in this clinic were 1.21 on the right and 1.3 on the left. She is previously seen vascular surgery who does not think that there is a peripheral arterial issue. 01/30/17; Patient arrives with no open wound on the left leg. She has been to elastic therapy and obtained 20-39mmhg below knee stockings and she has one on the right leg today. READMISSION 02/19/18; this Pitzer is a now 74 year old patient we've had in this clinic perhaps 3 times before. I had last looked at her from January 07 December 2016 with an area on the medial left leg. We discharged her on 12/25/16 however she had to be readmitted on 01/22/17 with a recurrence. I have in my notes that we discharged her on 20-30 mm stockings although she tells me she was only wearing support hose because she cannot get stockings on predominantly related to her cervical spine surgery/issues. She has had previous ablations done by vein and vascular in [place] including a great saphenous vein ablation on the left with an anterior accessory branch ablation I think both of these were in 2016. On one of the previous visit she had a biopsy noted 2009 that was negative. She is not felt to have an arterial issue. She is not a diabetic. She does  have a history of obstructive sleep apnea hypertension asthma as well as chronic venous insufficiency and lymphedema. On this occasion she noted 2 dry scaly patch on her left leg. She tried to put lotion on this it didn't really help. There were 2 open areas.the [name], [name] ([id number]) patient has been seeing her primary physician from 02/05/18 through 02/14/18. She had Unna boots applied. The superior wound now on the lateral left leg has closed but she's had one wound that remains open on the lateral left leg. This is not the same spot as we dealt with in 2018. ABIs in this clinic were 1.3 bilaterally 02/26/18; patient has a small wound on  the left lateral calf. Dimensions are down. She has chronic venous insufficiency and lymphedema. 03/05/18; small open area on the left lateral calf. Dimensions are down. Tightly adherent necrotic debris over the surface of the wound which was difficult to remove. Also the dressing [over collagen] stuck to the wound surface. This was removed with some difficulty as well. Change the primary dressing to Hydrofera Blue ready 03/12/18; small open area on the left lateral calf. Comes in with tightly adherent surface eschar as well as some adherent Hydrofera Blue. 03/19/18; open area on the left lateral calf. Again adherent surface eschar as well as some adherent Hydrofera Blue nonviable subcutaneous tissue. She complained of pain all week even with the reduction from 4-3 layer compression I put on last week. Also she had an increase in her ankle and calf measurements probably related to the same thing. 03/26/18; open area on the left lateral calf. A very small open area remains here. We used silver alginate starting last week as the Hydrofera Blue seem to stick to the wound bed. In using 4-layer compression 04/02/18; the open area in the left lateral calf at some adherent slough which I removed there is no open area here. We are able to transition her into her own compression stocking. Truthfully I think this is probably his support hose. However this does not maintain skin integrity will be limited. She cannot put over the toe compression stockings on because of neck problems hand problems etc. She is allergic to the lining layer of juxta lites. We might be forced to use extremitease stocking should this fail READMIT 11/24/2018 Patient is now a 74 year old woman who is not a diabetic. She has been in this clinic on at least 3 previous occasions largely with recurrent wounds on her left leg secondary to chronic venous insufficiency with secondary lymphedema. Her situation is complicated by inability to  get stockings on and an allergy to neoprene which is apparently a component and at least juxta lites and other stockings. As a result she really has not been wearing any stockings on her legs. She tells [place] that roughly 2 or 3 weeks ago she started noticing a stinging sensation just above her ankle on the left medial aspect. She has been diagnosed with pseudogout and she wondered whether this was what she was experiencing. She tried to dress this with something she bought at the store however subsequently it pulled skin off and now she has an open wound that is not improving. She has been using Vaseline gauze with a cover bandage. She saw her primary doctor last week who put an Haematologist on her. ABIs in this clinic was 1.03 on the left 2/12; the area is on the left medial ankle. Odd-looking wound with what looks to be surface epithelialization but a multitude of  small petechial openings. This clearly not closed yet. We have been using silver alginate under 3 layer compression with TCA 2/19; the wound area did not look quite as good this week. Necrotic debris over the majority of the wound surface which required debridement. She continues to have a multitude of what looked to be small petechial openings. She reminds [place] that she had a biopsy on this initially during her first outbreak in 2015 in [place] dermatology. She expresses concern about this being a possible melanoma. 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 I assured her that this does not look anything like melanoma. Except for the petechial reaction it does look like a venous insufficiency area and she certainly has evidence of this on both sides 2/26; a difficult area on the left medial ankle. The patient clearly has chronic venous hypertension with some degree of lymphedema. The odd thing about the area is the small petechial hemorrhages. I am not  really sure how to explain this. This was present last time and this is not a compression injury. We have been using Hydrofera Blue which I changed to last week 3/4; still using Hydrofera Blue. Aggressive debridement today. She does not have known arterial issues. She has seen Dr. Kellie Simmering at [facility] vein and vascular and and has an ablation on the left. [Anterior accessory branch of the greater saphenous]. From what I remember they did not feel she had an arterial issue. The patient has had this area biopsied in 2009 at [facility] dermatology and by her recollection they said this was "stasis". She is also follow-up with dermatology locally who thought that this was more of a vascular issue 3/11; using Hydrofera Blue. Aggressive debridement today. She does not have an arterial issue. We are using 3 layer compression although we may need to go to 4. The patient has been in for multiple changes to her wrap since I last saw her a week ago. She says that the area was leaking. I do not have too much more information on what was found 01/19/19 on evaluation today patient was actually being seen for a nurse visit when unfortunately she had the area on her left lateral lower extremity as well as weeping from the right lower extremity that became apparent. Therefore we did end up actually seeing her for a full visit with myself. She is having some pain at this site as well but fortunately nothing too significant at this point. No fevers, chills, nausea, or vomiting noted at this time. 3/18-Patient is back to the clinic with the left leg venous leg ulcer, the ulcer is larger in size, has a surface that is densely adherent with fibrinous tissue, the Hydrofera Blue was used but is densely adherent and there was difficulty in removing it. The right lower extremity was also wrapped for weeping edema. Patient has a new area over the left lateral foot above the malleolus that is small and appears to have no debris  with intact surrounding skin. Patient is on increased dose of Lasix also as a means to edema management 3/25; the patient has a nonhealing venous ulcer on the medial left leg and last week developed a smaller area on the lateral left calf. We have been using Hydrofera Blue with a contact layer. 4/1; no major change in these wounds areas. Left medial and more recently left lateral calf. I tried Iodoflex last week to aid in debridement  she did not tolerate this. She stated her pain was terrible all week. She took the top layer of the 4 layer compression off. 4/8; the patient actually looks somewhat better in terms of her more prominent left lateral calf wound. There is some healthy looking tissue here. She is still complaining of a lot of discomfort. 4/15; patient in a lot of pain secondary to sciatica. She is on a prednisone taper prescribed by her primary physician. She has the 2 areas one on the left medial and more recently a smaller area on the left lateral calf. Both of these just above the malleoli 4/22; her back pain is better but she still states she is very uncomfortable and now feels she is intolerant to the [employer]. No real change in the wounds we have been using Sorbact. She has been previously intolerant to Iodoflex. There is not a lot of option about what we can use to debride this wound under compression that she no doubt needs. sHe states Ultram no longer works for her pain 4/29; no major change in the wounds slightly increased depth. Surface on the original medial wound perhaps somewhat improved however the more recent area on the lateral left ankle is 100% covered in very adherent debris we have been using Sorbact. She tolerates 4 layer compression well and her edema control is a lot better. She has not had to come in for a nurse check 5/6; no major change in the condition of the wounds. She did consent to debridement today which was done with some difficulty. Continuing Sorbact.  She did not tolerate Iodoflex. She was in for a check of her compression the day after we wrapped her last week this was adjusted but nothing much was found 5/13; no major change in the condition or area of the wounds. I was able to get a fairly aggressive debridement done on the lateral left leg wound. Even using Sorbact under compression. She came back in on Friday to have the wrap changed. She says she felt uncomfortable on the [name], [name]. ([id number]) lateral aspect of her ankle. She has a long history of chronic venous insufficiency including previous ablation surgery on this side. 5/20-Patient returns for wounds on left leg with both wounds covered in slough, with the lateral leg wound larger in size, she has been in 3 layer compression and felt more comfortable, she describes pain in ankle, in leg and pins and needles in foot, and is about to try Pamelor for this 6/3; wounds on the left lateral and left medial leg. The area medially which is the most recent of the 2 seems to have had the largest increase in dimensions. We have been using Sorbac to try and debride the surface. She has been to see orthopedics they apparently did a plain x-ray that was indeterminant. Diagnosed her with neuropathy and they have ordered an MRI to determine if there is underlying osteomyelitis. This was not high on my thought list but I suppose it is prudent. We have advised her to make an appointment with vein and vascular in [place]. She has a history of a left greater saphenous and accessory vein ablations I wonder if there is anything else that can be done from a surgical point of view to help in these difficult refractory wounds. We have previously healed this wound on one occasion but it keeps on reopening [medial side] 6/10; deep tissue culture I did last week I think on the left medial wound showed  both moderate E. coli and moderate staph aureus [MSSA]. She is going to require antibiotics and I have  chosen Augmentin. We have been using Sorbact and we have made better looking wound surface on both sides but certainly no improvement in wound area. She was back in last Friday apparently for a dressing changes the wrap was hurting her outer left ankle. She has not managed to get a hold of vein and vascular in [place]. We are going to have to make her that appointment 6/17; patient is tolerating the Augmentin. She had an MRI that I think was ordered by orthopedic surgeon this did not show osteomyelitis or an abscess did suggest cellulitis. We have been using Sorbact to the lateral and medial ankles. We have been trying to arrange a follow-up appointment with vein and vascular in [name] or did her original ablations. We apparently an area sent the request to vein and vascular in [facility] 6/24; patient has completed the Augmentin. We do not yet have a vein and vascular appointment in [place]. I am not sure what the issue is here we have asked her to call tomorrow. We are using Sorbact. Making some improvements and especially the medial wound. Both surfaces however look better medial and lateral. 7/1; the patient has been in contact with vein and vascular in [place] but has not yet received an appointment. Using Sorbact we have gradually improve the wound surface with no improvement in surface area. She is approved for Apligraf but the wound surface still is not completely viable. She has not had to come in for a dressing change 7/8; the patient has an appointment with vein and vascular on 7/31 which is a Friday afternoon. She is concerned about getting back here for [place] to dress her wounds. I think it is important to have them goal for her venous reflux/history of ablations etc. to see if anything else can be done. She apparently tested positive for 1 of the blood tests with regards to lupus and saw a rheumatologist. He has raised the issue of vasculitis again. I have had this thought in  the past however the evidence seems overwhelming that this is a venous reflux etiology. If the rheumatologist tells me there is clinical and laboratory investigation is positive for lupus I will rethink this. 7/15; the patient's wound surfaces are quite a bit better. The medial area which was her original wound now has no depth although the lateral wound which was the more recent area actually appears larger. Both with viable surfaces which is indeed better. Using Sorbact. I wanted to use Apligraf on her however there is the issue of the vein and vascular appointment on 7/31 at 2:00 in the afternoon which would not allow her to get back to be rewrapped and they would no doubt remove the graft 7/22; the patient's wound surfaces have moderate amount of debris although generally look better. The lateral one is larger with 2 small satellite areas superiorly. We are waiting for her vein and vascular appointment on 7/31. She has been approved for Apligraf which I would like to use after th 7/29; wound surfaces have improved no debridement is required we have been using Sorbact. She sees vein and vascular on Friday with this so question of whether anything can be done to lessen the likelihood of recurrence and/or speed the healing of these areas. She is already had previous ablations. She no doubt has severe venous hypertension 8/5-Patient returns at 1 week, she was in [employer]  for 3 days by her podiatrist, we have been using so backed to the wound, she has increased pain in both the wounds on the left lower leg especially the more distal one on the lateral aspect 8/12-Patient returns at 1 week and she is agreeable to having debridement in both wounds on her left leg today. We have been using Sorbact, and vascular studies were reviewed at last visit 8/19; the patient arrives with her wounds fairly clean and no debridement is required. We have used Sorbact which is really done a nice job in cleaning up  these very difficult wound surfaces. The patient saw Dr. Donzetta Matters of vascular surgery on 7/31. He did not feel that there was an arterial component. He felt that her treated greater saphenous vein is adequately addressed and that the small saphenous vein did not appear to be involved significantly. She was also noted to have deep venous reflux which is not treatable. Dr. Donzetta Matters mentioned the possibility of a central obstructive component leading to reflux and he offered her central venography. She wanted to discuss this or think about it. I have urged her to go ahead with this. She has had recurrent difficult wounds in these areas which do heal but after months in the clinic. If there is anything that can be done to reduce the likelihood of this I think it is worth it. 9/2 she is still working towards getting follow-up with Dr. Donzetta Matters to schedule her CT. Things are quite a bit worse venography. I put Apligraf on 2 weeks ago on both wounds on the medial and lateral part of her left lower leg. She arrives in clinic today with 3 superficial additional wounds above the area laterally and one below the wound medially. She describes a lot of discomfort. I think these are probably wrapped injuries. Does not look like she has cellulitis. 07/20/2019 on evaluation today patient appears to be doing somewhat poorly in regard to her lower extremity ulcers. She in fact showed signs of erythema in fact we may even be dealing with an infection at this time. Unfortunately I am unsure if this is just infection or if indeed there may be some allergic reaction that occurred as a result of the Apligraf application. With that being said that would be unusual but nonetheless not impossible in this patient is one who is unfortunately allergic to quite a bit. Currently we have been using the Sorbact which seems to do as well as anything for her. I do think we may want to obtain a culture today to see if there is anything showing up  there that may need to be addressed. 9/16; noted that last week the wounds look worse in 1 week follow-up of the Apligraf. Using Sorbact as of 2 days ago. She arrives with copious amounts of drainage and new skin breakdown on the back of the left calf. The wounds arm more substantial bilaterally. There is a fair amount of swelling in the left calf no overt DVT there is edema present I think in the left greater than right thigh. She is supposed to go on 9/28 for CT venography. The wounds on the medial and lateral calf are worse and she has new skin breakdown posteriorly at least new for me. This is almost developing into a circumferential wound area The Apligraf was taken off last week which I agree with things are not going in the right direction a culture was done we do not have that back yet. She is  on Augmentin that she started 2 days ago 9/23; dressing was changed by her nurses on Monday. In general there is no improvement in the wound areas although the area looks less angry than last week. She did get Augmentin for MSSA cultured on the 14th. She still appears to have too much swelling in the left leg even with 3 layer compression 9/30; the patient underwent her procedure on 9/28 by Dr. Donzetta Matters at [facility]. She was discovered to have the common iliac vein measuring 12.2 mm but at the level of L4-L5 measured 3 mm. After stenting it measured 10 mm. 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 Rouleaux flow in the common femoral and femoral vein was observed much improved after stenting. We are using silver alginate to the wounds on the medial and lateral ankle on the left. 4 layer compression 10/7; the patient had fluid swelling around her knee and 4 layer compression. At the advice of vein and vascular this was reduced to 3 layer which she is tolerating better. We have been using silver alginate under 3 layer compression since last Friday 10/14; arrives with the  areas on the left ankle looking a lot better. Inflammation in the area also a lot better. She came in for a nurse check on RODNEY, WIGGER ([id number]) 10/9 10/21; continued nice improvement. Slight improvements in surface area of both the medial and lateral wounds on the left. A lot of the satellite lesions in the weeping erythema around these from stasis dermatitis is resolved. We have been using silver alginate 10/28; general improvement in the entire wound areas although not a lot of change in dimensions the wound certainly looks better. There is a lot less in terms of venous inflammation. Continue silver alginate this week however look towards Hydrofera Blue next week 11/4; very adherent debris on the medial wound left wound is not as bad. We have been using silver alginate. Change to [facility] today 11/11; very adherent debris on both wound areas. She went to vein and vascular last week and follow-up they put in [place] boot on this today. He says the [facility] was adherent. Wound is definitely not as good as last week. Especially on the left there the satellite lesions look more prominent 11/18; absolutely no better. erythema on lateral aspect with tenderness. 09/30/2019 on evaluation today patient appears to actually be doing better. Dr. Dellia Nims did put her on doxycycline last week which I do believe has helped her at this point. Fortunately there is no signs of active infection at this time. No fevers, chills, nausea, vomiting, or diarrhea. I do believe he may want extend the doxycycline for 7 additional days just to ensure everything does completely cleared up the patient is in agreement with that plan. Otherwise she is going require some sharp debridement today 12/2; patient is completing a 2-week course of doxycycline. I gave her this empirically for inflammation as well as infection when I last saw her 2 weeks ago. All of this seems to be better. She is using silver alginate she  has the area on the medial aspect of the larger area laterally and the 2 small satellite regions laterally above the major wound. 12/9; the patient's wound on the left medial and left lateral calf look really quite good. We have been using silver alginate. She saw vein and vascular in follow-up on 10/09/2019. She has had a previous left greater saphenous vein ablation by Dr. Oscar La in 2016.  More recently she underwent a left common iliac vein stent by Dr. 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 The swelling is improved and certainly the wounds have improved. The patient shows [place] today area on the right medial calf there is almost no wound but leaking lymphedema. She says she start this started 3 or 4 days ago. She did not traumatize it. It is not painful. She does not wear compression on that side 12/16; the patient continues to do well laterally. Medially still requiring debridement. The area on the right calf did not materialize to anything and is not currently open. We wrapped this last time. She has support stockings for that leg although I am not sure they are going to provide adequate compression 12/23; the lateral wound looks stable. Medially still requiring debridement for tightly adherent fibrinous debris. We've been using silver alginate. Surface area not any different 12/30; neither wound is any better with regards to surface and the area on the left lateral is larger. I been using silver alginate to the left lateral which look quite good last week and Sorbact to the left medial 11/11/2019. Lateral wound area actually looks better and somewhat smaller. Medial still requires a very aggressive debridement today. We have been using Sorbact on both wound areas 1/13; not much better still adherent debris bilaterally. I been using Sorbact. She has severe venous hypertension. Probably some degree of dermal fibrosis distally. I wonder whether tighter compression might help and I am going  to try that today. We also need to work on the bioburden 1/20; using Sorbact. She has severe venous hypertension status post stent placement for pelvic vein compression. We applied gentamicin last time to see if we could reduce bioburden I had some discussion with her today about the use of pentoxifylline. This is occasionally used in this setting for wounds with refractory venous insufficiency. However this interacts with Plavix. She tells me that she was put on this after stent placement for 3 months. She will call Dr. Claretha Cooper office to discuss 1/27; we are using gentamicin under Sorbact. 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 She has a stent. Wound medially is measuring smaller this week. Laterally measuring slightly larger although she has some satellite lesions superiorly 2/3; gentamicin under Sorbact under 4-layer compression. 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 She has a stent on Plavix. Her wounds are measuring smaller this week. More substantially laterally where there is a satellite lesion superiorly. 2/10; gentamicin under Sorbac. 4-layer compression. Patient communicated with Dr. Donzetta Matters at vein and vascular in [place]. He is okay with the patient coming off Plavix I will therefore start her on pentoxifylline for a 1 month trial. In general her wounds look better today. I had some concerns about swelling in the left thigh however she measures 61.5 on the right and 63 on the mid thigh which does not suggest there is any difficulty. The patient is not describing any pain. 2/17; gentamicin under Sorbac 4-layer compression. She has been on pentoxifylline for 1 week and complains of loose stool. No nausea she is eating and drinking well 2/24; the patient apparently came in 2 days ago for a nurse visit when her wrap fell down. Both areas look a little worse this week macerated medially and satellite lesions laterally. Change to  silver alginate today 3/3; wounds are larger today especially medially. She also has more swelling in her foot lower leg and I even noted some swelling in  her posterior thigh which is tender. I wonder about the patency of her stent. Fortuitously she sees Dr. Claretha Cooper group on Friday 3/10; Mrs. Delio was seen by vein and vascular on 3/5. The patient underwent ultrasound. There was no evidence of thrombosis involving the IVC no evidence of thrombosis involving the right common iliac vein there is no evidence of thrombosis involving the right external iliac vein the left external vein is also patent. The right common iliac vein stent appears patent bilateral common femoral veins are compressible and appear patent. I was concerned about the left common iliac stent however it looks like this is functional. She has some edema in the posterior thigh that was tender she still has that this week. I also note they had trouble finding the pulses in her left foot and booked her for an ABI baseline in 4 weeks. She will follow up in 6 months for repeat IVC duplex. The patient stopped the pentoxifylline because of diarrhea. It does not look like that was being effective in any case. I have advised her to go back on her aspirin 81 mg tablet, vascular it also suggested this 3/17; comes in today with her wound surfaces a lot better. The excoriations from last week considerably better probably secondary to the TCA. We have been using silver alginate 3/24; comes in today with smaller wounds both medially and laterally. Both required debridement. There are 2 small satellite areas superiorly laterally. She also has a very odd bandlike area in the mid calf almost looking like there was a weakness in the wrap in a localized area. I would write this off as being this however anteriorly she has a small raised ballotable area that is very tender almost reminiscent of an abscess but there was no obvious purulent surface to  it. 02/04/20 upon evaluation today patient appears to be doing fairly well in regard to her wounds today. Fortunately there is no signs of active ELLERIE, ARENZ. ([id number]) infection at this time. No fevers, chills, nausea, vomiting, or diarrhea. She has been tolerating the dressing changes without complication. Fortunately I feel like she is showing signs of improvement although has been sometime since have seen her. Nonetheless the area of concern that Dr. Dellia Nims had last week where she had possibly an area of the wrap that was we can allow the leg to bulge appears to be doing significantly better today there is no signs of anything worsening. 4/7; the patient's wounds on her medial and lateral left leg continue to contract. We have been using a regular alginate. Last week she developed an area on the right medial lower leg which is probably a venous ulcer as well. 4/14; the wounds on her left medial and lateral lower leg continue to contract. Surface eschar. We have been using regular alginate. The area on the right medial lower leg is closed. We have been putting both legs under 4-layer contraction. The patient went back to see vein and vascular she had arterial studies done which were apparently "quite good" per the patient although I have not read their notes I have never felt she had an arterial issue. 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 This is been longstanding and refractory to exercise, leg elevation and longstanding use of compression wraps in our clinic as well as compression stockings on the times we have been able to get these to heal 4/21; we thought she actually might be close this week however she arrives in  clinic with a lot of edema in her upper left calf and into her posterior thigh. This is been an intermittent problem here. She says the wrap fell down but it was replaced with a nurse visit on Monday. We are using calcium  alginate to the wounds and the wound sizes there not terribly larger than last week but there is a lot more edema 4/28; again wound edges are smaller on both sides. Her edema is better controlled than last time. She is obtained her compression pumps from [employer] although they have not been to her home to set these up. 5/5; left medial and left lateral both look stable. I am not sure the medial is any smaller. We have been using calcium alginate under 4-layer compression. oShe had an area on the right medial. This was eschared today. We have been wrapping this as well. She does not tolerate external compression stockings due to a history of various contact allergies. She has her compression pumps however the representative from the company is coming on her to show her how to use these tomorrow 5/19; patient with severe chronic venous insufficiency secondary to central venous disease. She had a stent placed in her left common iliac vein. She has done better since but still difficult to control wounds. She comes in today with nothing open on the right leg. Her areas on the left medial and left lateral are just about closed. We are using calcium alginate under 4-layer compression. She is using her external compression pumps at home She only has 15-20 support stockings. States she cannot get anything tighter than that on. 03/30/20-Patient returns at 1 week, the wounds on the left leg are both slightly bigger, the last week she was on 3 layer compression which started to slide down. She is starting to use her lymphedema pumps although she stated on 1 day her right ankle started to swell up and she have to stop that day. Unfortunately the open area seem to oscillate between improving to the point of healing and then flaring up all to do with effectiveness of compression or lack of due to the left leg topography not keeping the compression wraps from rolling down 6/2 patient comes in with a 15/20  mmHg stocking on the right leg. She tells me that she developed a lot of swelling in her ankles she saw orthopedics she was felt to possibly be having a flare of pseudogout versus some other type of arthritis. She was put on steroids for a respiratory issue so that helps with the inflammation. She has not been using the pumps all week. She thinks the left thigh is more swollen than usual and I would agree with that. She has an appointment with Dr. Donzetta Matters 9 days or so from now 6/9; both wounds on the left medial and left lateral are smaller. We have been using calcium alginate under compression. She does not have an open wound on the right leg she is using a stocking and her compression pumps things are going well. She has an appointment with Dr. Donzetta Matters with regards to her stent in the left common iliac vein 6/16; the wounds on the left medial and left lateral ankle continues to contract. The patient saw Dr. Donzetta Matters and I think he seems satisfied. Ordered follow-up venous reflux studies on both sides in September. Cautioned that she may need thigh-high stockings. She has been using calcium alginate under compression on the left and her own stocking on the right leg.  She tells [place] there are no open wounds on the right 6/23; left lateral is just about closed. Medial required debridement today. We have been using calcium alginate. Extensive discussion about the compression pumps she is only using these on 25 mmHg states she could not take 40 or 30 when the wrap came out to her home to demonstrate these. He said they should not feel tight 6/30; the left lateral wound has a slight amount of eschar. . The area medially is about the same using Hydrofera Blue. 7/7; left lateral wound still has some eschar. I will remove this next week may be closed. The area medially is very small using Hydrofera Blue with improvement. Unfortunately the stockings fell down. Unfortunately the blisters have developed at the edge of  where the wrap fell. When this happened she says her legs hurt she did not use her pumps. We are not open Monday for her to come in and change the wraps and she had an appointment yesterday. She also tells me that she is going to have an MRI of her back. She is having pain radiating into her left anterior leg she thinks her from an L5 disc. She saw Dr. Ellene Route of neurosurgery 7/14; the area on the left lateral ankle area is closed. Still a small area medially however it looks better as well. We have been using Hydrofera Blue under 4-layer compression 7/21; left lateral ankle is still closed however her wound on the medial left calf is actually larger. This is probably because Hydrofera Blue got stuck to the wound. She came in for a nurse change on Friday and will do that again this week I was concerned about the amount of swelling that she had last week however she is using her compression pumps twice a day and the swelling seems well controlled Electronic Signature(s) Signed: 05/25/2020 4:41:04 PM By: Linton Ham MD Entered By: Linton Ham on 05/25/2020 13:00:52 [name], [name] ([id number]) -------------------------------------------------------------------------------- Physical Exam Details Patient Name: MADIA, CARVELL. Date of Service: 05/25/2020 12:30 PM Medical Record Number: [id number] Patient Account Number: [id number] Date of Birth/Sex: 05-04-1946 (74 y.o. F) Treating RN: Cornell Barman Primary Care Provider: [name] Other Clinician: Referring Provider: [name] Treating Provider/Extender: Tito Dine in Treatment: 76 Cardiovascular The edema control is excellent even up into her thigh. Notes Wound exam; the area on the left medial ankle it looks as though there is a skin tear. The wound surface looks quite healthy. Electronic Signature(s) Signed: 05/25/2020 4:41:04 PM By: Linton Ham MD Entered By: Linton Ham on 05/25/2020 13:04:04 [name] ([id number]) -------------------------------------------------------------------------------- Physician Orders Details Patient Name: STARNISHA, BATREZ. Date of Service: 05/25/2020 12:30 PM Medical Record Number: [id number] Patient Account Number: [id number] Date of Birth/Sex: 21-Feb-1946 (74 y.o. F) Treating RN: Cornell Barman Primary Care Provider: [name] Other Clinician: Referring Provider: [name] Treating Provider/Extender: Tito Dine in Treatment: 27 Verbal / Phone Orders: No Diagnosis Coding Anesthetic (add to Medication List) Wound #5 Left,Medial Lower Leg o Topical Lidocaine 4% cream applied to wound bed prior to debridement (In Clinic Only). Primary Wound Dressing Wound #5 Left,Medial Lower Leg o Silver Collagen - moistened with hydrogel Secondary Dressing Wound #5 Left,Medial Lower Leg o ABD pad Dressing Change Frequency Wound #5 Left,Medial Lower Leg o Change dressing every week o Other: - Nurse visit Friday and Monday if needed. Follow-up Appointments Wound #5 Left,Medial Lower Leg o Return Appointment in 1 week. o Nurse Visit as needed Edema  Control Wound #5 Left,Medial Lower Leg o 4-Layer Compression System - Left Lower Extremity. o Patient to wear own compression stockings - On Right o Compression Pump: Use compression pump on left lower extremity for 60 minutes, twice daily. o Compression Pump: Use compression pump on right lower extremity for 60 minutes, twice daily. Electronic Signature(s) Signed: 05/25/2020 4:41:04 PM By: Linton Ham MD Signed: 05/26/2020 6:25:29 PM By: [name], [name], RN, CWS, [name], [name] Entered By: [name], [name], RN, CWS, Kim on 05/25/2020 12:57:22 [name], [name] ([id number]) -------------------------------------------------------------------------------- Problem List Details Patient Name: LAURELYN, TERRERO. Date of Service: 05/25/2020 12:30 PM Medical Record Number: [id number] Patient  Account Number: [id number] Date of Birth/Sex: 05-10-1946 (74 y.o. F) Treating RN: Cornell Barman Primary Care Provider: [name] Other Clinician: Referring Provider: [name] Treating Provider/Extender: Tito Dine in Treatment: 76 Active Problems ICD-10 Encounter Code Description Active Date MDM Diagnosis L97.221 Non-pressure chronic ulcer of left calf limited to breakdown of skin 01/07/2019 No Yes I89.0 Lymphedema, not elsewhere classified 12/10/2018 No Yes I87.332 Chronic venous hypertension (idiopathic) with ulcer and inflammation of 12/09/2019 No Yes left lower extremity I83.009 Varicose veins of unspecified lower extremity with ulcer of unspecified 04/13/2020 No Yes site I82.592 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 12/09/2019 No Yes lower extremity Inactive Problems ICD-10 Code Description Active Date Inactive Date L97.818 Non-pressure chronic ulcer of other part of right lower leg with other specified 10/14/2019 10/14/2019 severity Resolved Problems ICD-10 Code Description Active Date Resolved Date L97.211 Non-pressure chronic ulcer of right calf limited to breakdown of skin 02/10/2020 02/10/2020 Electronic Signature(s) Signed: 05/25/2020 4:41:04 PM By: Linton Ham MD Entered By: Linton Ham on 05/25/2020 12:57:57 Honold, [name] ([id number]) -------------------------------------------------------------------------------- Progress Note Details Patient Name: CAMERIN, JIMENEZ. Date of Service: 05/25/2020 12:30 PM Medical Record Number: [id number] Patient Account Number: [id number] Date of Birth/Sex: [birth year] (74 y.o. F) Treating RN: Cornell Barman Primary Care Provider: [name] Other Clinician: Referring Provider: [name] Treating Provider/Extender: Tito Dine in Treatment: 6 Subjective History of Present Illness (HPI) Pleasant 74 year old with history of chronic venous insufficiency. No  diabetes or peripheral vascular disease. Left ABI 1.29. Questionable history of left lower extremity DVT. She developed a recurrent ulceration on her left lateral calf in December 2015, which she attributes to poor diet and subsequent lower extremity edema. She underwent endovenous laser ablation of her left greater saphenous vein in 2010. She underwent laser ablation of accessory branch of left GSV in April 2016 by Dr. Kellie Simmering at [facility]. She was previously wearing [place] boots, which she tolerated well. Tolerating 2 layer compression and cadexomer iodine. She returns to clinic for follow-up and is without new complaints. She denies any significant pain at this time. She reports persistent pain with pressure. No claudication or ischemic rest pain. No fever or chills. No drainage. READMISSION 11/13/16; this is a 74 year old woman who is not a diabetic. She is here for a review of a painful area on her left medial lower extremity. I note that she was seen here previously last year for wound I believe to be in the same area. At that time she had undergone previously a left greater saphenous vein ablation by Dr. Kellie Simmering and she had a ablation of the anterior accessory branch of the left greater saphenous vein in March 2016. Seeing that the wound actually closed over. In reviewing the history with her today the ulcer in this area has been recurrent. She describes a biopsy  of this area in 2009 that only showed stasis physiology. 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 Do not see that she has ever had arterial studies in the left leg. She tells me she has a follow-up with Dr. Kellie Simmering in roughly 10 days In any case she developed the reopening of this area roughly a month ago. On the  background of this she describes rapidly increasing edema which has responded to Lasix 40 mg and metolazone 2.5 mg as well as the patient's lymph massage. She has been told she has both venous insufficiency and lymphedema but she cannot tolerate compression stockings 11/28/16; the patient saw Dr. Kellie Simmering recently. Per the patient he did arterial Dopplers in the office that did not show evidence of arterial insufficiency, per the patient he stated "treat this like an ordinary venous ulcer". She also saw her dermatologist Dr. Ronnald Ramp who felt that this was more of a vascular ulcer. In general things are improving although she arrives today with increasing bilateral lower extremity edema with weeping a deeper fluid through the wound on the left medial leg compatible with some degree of lymphedema 12/04/16; the patient's wound is fully epithelialized but I don't think fully healed. We will do another week of depression with Promogran and TCA however I suspect we'll be able to discharge her next week. This is a very unusual-looking wound which was initially a figure-of-eight type wound lying on its side surrounded by petechial like hemorrhage. She has had venous ablation on this side. She apparently does not have an arterial issue per Dr. Kellie Simmering. She saw her dermatologist thought it was "vascular". Patient is definitely going to need ongoing compression and I talked about this with her today she will go to elastic therapy after she leaves here next week 12/11/16; the patient's wound is not completely closed today. She has surrounding scar tissue and in further discussion with the patient it would appear that she had ulcers in this area in 2009 for a prolonged period of time ultimately requiring a punch biopsy of this area that only showed venous insufficiency. I did not previously pickup on this part of the history from the patient. 12/18/16; the patient's wound is completely epithelialized. There is no open area  here. She has significant bilateral venous insufficiency with secondary lymphedema to a mild-to-moderate degree she does not have compression stockings.. She did not say anything to me when I was in the room, she told our intake nurse that she was still having pain in this area. This isn't unusual recurrent small open area. She is going to go to elastic therapy to obtain compression stockings. 12/25/16; the patient's wound is fully epithelialized. There is no open area here. The patient describes some continued episodic discomfort in this area medial left calf. However everything looks fine and healed here. She is been to elastic therapy and caught herself 15-20 mmHg stockings, they apparently were having trouble getting 20-30 mm stockings in her size 01/22/17; this is a patient we discharged from the clinic a month ago. She has a recurrent open wound on her medial left calf. She had 15 mm support stockings. I told her I thought she needed 20-30 mm compression stockings. She tells me that she has been ill with hospitalization secondary to  asthma and is been found to have severe hypokalemia likely secondary to a combination of Lasix and metolazone. This morning she noted blistering and leaking fluid on the posterior part of her left leg. She called our intake nurse urgently and we was saw her this afternoon. She has not had any real discomfort here. I don't know that she's been wearing any stockings on this leg for at least 2-3 days. ABIs in this clinic were 1.21 on the right and 1.3 on the left. She is previously seen vascular surgery who does not think that there is a peripheral arterial issue. 01/30/17; Patient arrives with no open wound on the left leg. She has been to elastic therapy and obtained 20-29mmhg below knee stockings and she has one on the right leg today. READMISSION 02/19/18; this [place] is a now 74 year old patient we've had in this clinic perhaps 3 times before. I had last looked at her  from January 07 December 2016 with an area on the medial left leg. We discharged her on 12/25/16 however she had to be readmitted on 01/22/17 with a recurrence. I have in my notes that we discharged her on 20-30 mm stockings although she tells me she was only wearing support hose because she cannot get stockings on predominantly related to her cervical spine surgery/issues. She has had previous ablations done by vein and vascular in [place] including a great saphenous vein ablation on the left with an anterior accessory branch ablation I think both of these were in 2016. On one of the previous visit she had a biopsy noted 2009 that was negative. She is not felt to have an arterial issue. She is not a diabetic. She does have a history of obstructive sleep apnea hypertension asthma as well as chronic venous insufficiency and lymphedema. On this occasion she noted 2 dry scaly patch on her left leg. She tried to put lotion on this it didn't really help. There were 2 open areas.the patient has been seeing her primary physician from 02/05/18 through 02/14/18. She had Unna boots applied. The superior wound now on the lateral left leg has closed but she's had one wound that remains open on the lateral left leg. This is not the same spot as we dealt with in 2018. ABIs in this clinic were 1.3 bilaterally [name], [name] ([id number]) 02/26/18; patient has a small wound on the left lateral calf. Dimensions are down. She has chronic venous insufficiency and lymphedema. 03/05/18; small open area on the left lateral calf. Dimensions are down. Tightly adherent necrotic debris over the surface of the wound which was difficult to remove. Also the dressing [over collagen] stuck to the wound surface. This was removed with some difficulty as well. Change the primary dressing to Hydrofera Blue ready 03/12/18; small open area on the left lateral calf. Comes in with tightly adherent surface eschar as well as some adherent Hydrofera  Blue. 03/19/18; open area on the left lateral calf. Again adherent surface eschar as well as some adherent Hydrofera Blue nonviable subcutaneous tissue. She complained of pain all week even with the reduction from 4-3 layer compression I put on last week. Also she had an increase in her ankle and calf measurements probably related to the same thing. 03/26/18; open area on the left lateral calf. A very small open area remains here. We used silver alginate starting last week as the Hydrofera Blue seem to stick to the wound bed. In using 4-layer compression 04/02/18; the open area in the left  lateral calf at some adherent slough which I removed there is no open area here. We are able to transition her into her own compression stocking. Truthfully I think this is probably his support hose. However this does not maintain skin integrity will be limited. She cannot put over the toe compression stockings on because of neck problems hand problems etc. She is allergic to the lining layer of juxta lites. We might be forced to use extremitease stocking should this fail READMIT 11/24/2018 Patient is now a 74 year old woman who is not a diabetic. She has been in this clinic on at least 3 previous occasions largely with recurrent wounds on her left leg secondary to chronic venous insufficiency with secondary lymphedema. Her situation is complicated by inability to get stockings on and an allergy to neoprene which is apparently a component and at least juxta lites and other stockings. As a result she really has not been wearing any stockings on her legs. She tells [place] that roughly 2 or 3 weeks ago she started noticing a stinging sensation just above her ankle on the left medial aspect. She has been diagnosed with pseudogout and she wondered whether this was what she was experiencing. She tried to dress this with something she bought at the store however subsequently it pulled skin off and now she has an open wound that  is not improving. She has been using Vaseline gauze with a cover bandage. She saw her primary doctor last week who put an Haematologist on her. ABIs in this clinic was 1.03 on the left 2/12; the area is on the left medial ankle. Odd-looking wound with what looks to be surface epithelialization but a multitude of small petechial openings. This clearly not closed yet. We have been using silver alginate under 3 layer compression with TCA 2/19; the wound area did not look quite as good this week. Necrotic debris over the majority of the wound surface which required debridement. She continues to have a multitude of what looked to be small petechial openings. She reminds [place] that she had a biopsy on this initially during her first outbreak in 2015 in [place] dermatology. She expresses concern about this being a possible melanoma. 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 I assured her that this does not look anything like melanoma. Except for the petechial reaction it does look like a venous insufficiency area and she certainly has evidence of this on both sides 2/26; a difficult area on the left medial ankle. The patient clearly has chronic venous hypertension with some degree of lymphedema. The odd thing about the area is the small petechial hemorrhages. I am not really sure how to explain this. This was present last time and this is not a compression injury. We have been using Hydrofera Blue which I changed to last week 3/4; still using Hydrofera Blue. Aggressive debridement today. She does not have known arterial issues. She has seen Dr. Kellie Simmering at [facility] vein and vascular and and has an ablation on the left. [Anterior accessory branch of the greater saphenous]. From what I remember they did not feel she had an arterial issue. The patient has had this area biopsied in 2009 at [facility] dermatology and by her  recollection they said this was "stasis". She is also follow-up with dermatology locally who thought that this was more of a vascular issue 3/11; using Hydrofera Blue. Aggressive debridement today. She does not  have an arterial issue. We are using 3 layer compression although we may need to go to 4. The patient has been in for multiple changes to her wrap since I last saw her a week ago. She says that the area was leaking. I do not have too much more information on what was found 01/19/19 on evaluation today patient was actually being seen for a nurse visit when unfortunately she had the area on her left lateral lower extremity as well as weeping from the right lower extremity that became apparent. Therefore we did end up actually seeing her for a full visit with myself. She is having some pain at this site as well but fortunately nothing too significant at this point. No fevers, chills, nausea, or vomiting noted at this time. 3/18-Patient is back to the clinic with the left leg venous leg ulcer, the ulcer is larger in size, has a surface that is densely adherent with fibrinous tissue, the Hydrofera Blue was used but is densely adherent and there was difficulty in removing it. The right lower extremity was also wrapped for weeping edema. Patient has a new area over the left lateral foot above the malleolus that is small and appears to have no debris with intact surrounding skin. Patient is on increased dose of Lasix also as a means to edema management 3/25; the patient has a nonhealing venous ulcer on the medial left leg and last week developed a smaller area on the lateral left calf. We have been using Hydrofera Blue with a contact layer. 4/1; no major change in these wounds areas. Left medial and more recently left lateral calf. I tried Iodoflex last week to aid in debridement she did not tolerate this. She stated her pain was terrible all week. She took the top layer of the 4 layer compression  off. 4/8; the patient actually looks somewhat better in terms of her more prominent left lateral calf wound. There is some healthy looking tissue here. She is still complaining of a lot of discomfort. 4/15; patient in a lot of pain secondary to sciatica. She is on a prednisone taper prescribed by her primary physician. She has the 2 areas one on the left medial and more recently a smaller area on the left lateral calf. Both of these just above the malleoli 4/22; her back pain is better but she still states she is very uncomfortable and now feels she is intolerant to the [employer]. No real change in the wounds we have been using Sorbact. She has been previously intolerant to Iodoflex. There is not a lot of option about what we can use to debride this wound under compression that she no doubt needs. sHe states Ultram no longer works for her pain 4/29; no major change in the wounds slightly increased depth. Surface on the original medial wound perhaps somewhat improved however the more recent area on the lateral left ankle is 100% covered in very adherent debris we have been using Sorbact. She tolerates 4 layer compression well and her edema control is a lot better. She has not had to come in for a nurse check 5/6; no major change in the condition of the wounds. She did consent to debridement today which was done with some difficulty. Continuing Sorbact. She did not tolerate Iodoflex. She was in for a check of her compression the day after we wrapped her last week this was adjusted but nothing much was found 5/13; no major change in the condition or area  of the wounds. I was able to get a fairly aggressive debridement done on the lateral left leg wound. Even using Sorbact under compression. She came back in on Friday to have the wrap changed. She says she felt uncomfortable on the lateral aspect of her ankle. She has a long history of chronic venous insufficiency including previous ablation surgery on  this side. 5/20-Patient returns for wounds on left leg with both wounds covered in slough, with the lateral leg wound larger in size, she has been in 3 layer compression and felt more comfortable, she describes pain in ankle, in leg and pins and needles in foot, and is about to try Pamelor for this 6/3; wounds on the left lateral and left medial leg. The area medially which is the most recent of the 2 seems to have had the largest increase in [name], [name]. ([id number]) dimensions. We have been using Sorbac to try and debride the surface. She has been to see orthopedics they apparently did a plain x-ray that was indeterminant. Diagnosed her with neuropathy and they have ordered an MRI to determine if there is underlying osteomyelitis. This was not high on my thought list but I suppose it is prudent. We have advised her to make an appointment with vein and vascular in [place]. She has a history of a left greater saphenous and accessory vein ablations I wonder if there is anything else that can be done from a surgical point of view to help in these difficult refractory wounds. We have previously healed this wound on one occasion but it keeps on reopening [medial side] 6/10; deep tissue culture I did last week I think on the left medial wound showed both moderate E. coli and moderate staph aureus [MSSA]. She is going to require antibiotics and I have chosen Augmentin. We have been using Sorbact and we have made better looking wound surface on both sides but certainly no improvement in wound area. She was back in last Friday apparently for a dressing changes the wrap was hurting her outer left ankle. She has not managed to get a hold of vein and vascular in [place]. We are going to have to make her that appointment 6/17; patient is tolerating the Augmentin. She had an MRI that I think was ordered by orthopedic surgeon this did not show osteomyelitis or an abscess did suggest cellulitis. We have  been using Sorbact to the lateral and medial ankles. We have been trying to arrange a follow-up appointment with vein and vascular in [place] or did her original ablations. We apparently an area sent the request to vein and vascular in [facility] 6/24; patient has completed the Augmentin. We do not yet have a vein and vascular appointment in [place]. I am not sure what the issue is here we have asked her to call tomorrow. We are using Sorbact. Making some improvements and especially the medial wound. Both surfaces however look better medial and lateral. 7/1; the patient has been in contact with vein and vascular in [place] but has not yet received an appointment. Using Sorbact we have gradually improve the wound surface with no improvement in surface area. She is approved for Apligraf but the wound surface still is not completely viable. She has not had to come in for a dressing change 7/8; the patient has an appointment with vein and vascular on 7/31 which is a Friday afternoon. She is concerned about getting back here for [place] to dress her wounds. I think it is important  to have them goal for her venous reflux/history of ablations etc. to see if anything else can be done. She apparently tested positive for 1 of the blood tests with regards to lupus and saw a rheumatologist. He has raised the issue of vasculitis again. I have had this thought in the past however the evidence seems overwhelming that this is a venous reflux etiology. If the rheumatologist tells me there is clinical and laboratory investigation is positive for lupus I will rethink this. 7/15; the patient's wound surfaces are quite a bit better. The medial area which was her original wound now has no depth although the lateral wound which was the more recent area actually appears larger. Both with viable surfaces which is indeed better. Using Sorbact. I wanted to use Apligraf on her however there is the issue of the vein and  vascular appointment on 7/31 at 2:00 in the afternoon which would not allow her to get back to be rewrapped and they would no doubt remove the graft 7/22; the patient's wound surfaces have moderate amount of debris although generally look better. The lateral one is larger with 2 small satellite areas superiorly. We are waiting for her vein and vascular appointment on 7/31. She has been approved for Apligraf which I would like to use after th 7/29; wound surfaces have improved no debridement is required we have been using Sorbact. She sees vein and vascular on Friday with this so question of whether anything can be done to lessen the likelihood of recurrence and/or speed the healing of these areas. She is already had previous ablations. She no doubt has severe venous hypertension 8/5-Patient returns at 1 week, she was in [place] for 3 days by her podiatrist, we have been using so backed to the wound, she has increased pain in both the wounds on the left lower leg especially the more distal one on the lateral aspect 8/12-Patient returns at 1 week and she is agreeable to having debridement in both wounds on her left leg today. We have been using Sorbact, and vascular studies were reviewed at last visit 8/19; the patient arrives with her wounds fairly clean and no debridement is required. We have used Sorbact which is really done a nice job in cleaning up these very difficult wound surfaces. The patient saw Dr. Donzetta Matters of vascular surgery on 7/31. He did not feel that there was an arterial component. He felt that her treated greater saphenous vein is adequately addressed and that the small saphenous vein did not appear to be involved significantly. She was also noted to have deep venous reflux which is not treatable. Dr. Donzetta Matters mentioned the possibility of a central obstructive component leading to reflux and he offered her central venography. She wanted to discuss this or think about it. I have urged her  to go ahead with this. She has had recurrent difficult wounds in these areas which do heal but after months in the clinic. If there is anything that can be done to reduce the likelihood of this I think it is worth it. 9/2 she is still working towards getting follow-up with Dr. Donzetta Matters to schedule her CT. Things are quite a bit worse venography. I put Apligraf on 2 weeks ago on both wounds on the medial and lateral part of her left lower leg. She arrives in clinic today with 3 superficial additional wounds above the area laterally and one below the wound medially. She describes a lot of discomfort. I think these are  probably wrapped injuries. Does not look like she has cellulitis. 07/20/2019 on evaluation today patient appears to be doing somewhat poorly in regard to her lower extremity ulcers. She in fact showed signs of erythema in fact we may even be dealing with an infection at this time. Unfortunately I am unsure if this is just infection or if indeed there may be some allergic reaction that occurred as a result of the Apligraf application. With that being said that would be unusual but nonetheless not impossible in this patient is one who is unfortunately allergic to quite a bit. Currently we have been using the Sorbact which seems to do as well as anything for her. I do think we may want to obtain a culture today to see if there is anything showing up there that may need to be addressed. 9/16; noted that last week the wounds look worse in 1 week follow-up of the Apligraf. Using Sorbact as of 2 days ago. She arrives with copious amounts of drainage and new skin breakdown on the back of the left calf. The wounds arm more substantial bilaterally. There is a fair amount of swelling in the left calf no overt DVT there is edema present I think in the left greater than right thigh. She is supposed to go on 9/28 for CT venography. The wounds on the medial and lateral calf are worse and she has new skin  breakdown posteriorly at least new for me. This is almost developing into a circumferential wound area The Apligraf was taken off last week which I agree with things are not going in the right direction a culture was done we do not have that back yet. She is on Augmentin that she started 2 days ago 9/23; dressing was changed by her nurses on Monday. In general there is no improvement in the wound areas although the area looks less angry than last week. She did get Augmentin for MSSA cultured on the 14th. She still appears to have too much swelling in the left leg even with 3 layer compression 9/30; the patient underwent her procedure on 9/28 by Dr. Donzetta Matters at vascular and vein specialist. She was discovered to have the common iliac vein measuring 12.2 mm but at the level of L4-L5 measured 3 mm. After stenting it measured 10 mm. 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 Rouleaux flow in the common femoral and femoral vein was observed much improved after stenting. We are using silver alginate to the wounds on the medial and lateral ankle on the left. 4 layer compression 10/7; the patient had fluid swelling around her knee and 4 layer compression. At the advice of vein and vascular this was reduced to 3 layer which she is tolerating better. We have been using silver alginate under 3 layer compression since last Friday 10/14; arrives with the areas on the left ankle looking a lot better. Inflammation in the area also a lot better. She came in for a nurse check on 10/9 10/21; continued nice improvement. Slight improvements in surface area of both the medial and lateral wounds on the left. A lot of the satellite lesions in the weeping erythema around these from stasis dermatitis is resolved. We have been using silver alginate [name], [name] ([id number]) 10/28; general improvement in the entire wound areas although not a lot of change in dimensions the wound certainly looks better. There  is a lot less in terms of venous inflammation. Continue silver alginate this week however  look towards Hydrofera Blue next week 11/4; very adherent debris on the medial wound left wound is not as bad. We have been using silver alginate. Change to [facility] today 11/11; very adherent debris on both wound areas. She went to vein and vascular last week and follow-up they put in [employer] boot on this today. He says the [facility] was adherent. Wound is definitely not as good as last week. Especially on the left there the satellite lesions look more prominent 11/18; absolutely no better. erythema on lateral aspect with tenderness. 09/30/2019 on evaluation today patient appears to actually be doing better. Dr. Dellia Nims did put her on doxycycline last week which I do believe has helped her at this point. Fortunately there is no signs of active infection at this time. No fevers, chills, nausea, vomiting, or diarrhea. I do believe he may want extend the doxycycline for 7 additional days just to ensure everything does completely cleared up the patient is in agreement with that plan. Otherwise she is going require some sharp debridement today 12/2; patient is completing a 2-week course of doxycycline. I gave her this empirically for inflammation as well as infection when I last saw her 2 weeks ago. All of this seems to be better. She is using silver alginate she has the area on the medial aspect of the larger area laterally and the 2 small satellite regions laterally above the major wound. 12/9; the patient's wound on the left medial and left lateral calf look really quite good. We have been using silver alginate. She saw vein and vascular in follow-up on 10/09/2019. She has had a previous left greater saphenous vein ablation by Dr. Oscar La in 2016. More recently she underwent a left common iliac vein stent by Dr. 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 The swelling is improved and certainly  the wounds have improved. The patient shows [place] today area on the right medial calf there is almost no wound but leaking lymphedema. She says she start this started 3 or 4 days ago. She did not traumatize it. It is not painful. She does not wear compression on that side 12/16; the patient continues to do well laterally. Medially still requiring debridement. The area on the right calf did not materialize to anything and is not currently open. We wrapped this last time. She has support stockings for that leg although I am not sure they are going to provide adequate compression 12/23; the lateral wound looks stable. Medially still requiring debridement for tightly adherent fibrinous debris. We've been using silver alginate. Surface area not any different 12/30; neither wound is any better with regards to surface and the area on the left lateral is larger. I been using silver alginate to the left lateral which look quite good last week and Sorbact to the left medial 11/11/2019. Lateral wound area actually looks better and somewhat smaller. Medial still requires a very aggressive debridement today. We have been using Sorbact on both wound areas 1/13; not much better still adherent debris bilaterally. I been using Sorbact. She has severe venous hypertension. Probably some degree of dermal fibrosis distally. I wonder whether tighter compression might help and I am going to try that today. We also need to work on the bioburden 1/20; using Sorbact. She has severe venous hypertension status post stent placement for pelvic vein compression. We applied gentamicin last time to see if we could reduce bioburden I had some discussion with her today about the use of pentoxifylline.  This is occasionally used in this setting for wounds with refractory venous insufficiency. However this interacts with Plavix. She tells me that she was put on this after stent placement for 3 months. She will call Dr. Claretha Cooper office to  discuss 1/27; we are using gentamicin under Sorbact. 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 She has a stent. Wound medially is measuring smaller this week. Laterally measuring slightly larger although she has some satellite lesions superiorly 2/3; gentamicin under Sorbact under 4-layer compression. 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 She has a stent on Plavix. Her wounds are measuring smaller this week. More substantially laterally where there is a satellite lesion superiorly. 2/10; gentamicin under Sorbac. 4-layer compression. Patient communicated with Dr. Donzetta [facility] at [facility] in [place]. He is okay with the patient coming off Plavix I will therefore start her on pentoxifylline for a 1 month trial. In general her wounds look better today. I had some concerns about swelling in the left thigh however she measures 61.5 on the right and 63 on the mid thigh which does not suggest there is any difficulty. The patient is not describing any pain. 2/17; gentamicin under Sorbac 4-layer compression. She has been on pentoxifylline for 1 week and complains of loose stool. No nausea she is eating and drinking well 2/24; the patient apparently came in 2 days ago for a nurse visit when her wrap fell down. Both areas look a little worse this week macerated medially and satellite lesions laterally. Change to silver alginate today 3/3; wounds are larger today especially medially. She also has more swelling in her foot lower leg and I even noted some swelling in her posterior thigh which is tender. I wonder about the patency of her stent. Fortuitously she sees Dr. Claretha Cooper group on Friday 3/10; Mrs. Gordin was seen by [facility] on 3/5. The patient underwent ultrasound. There was no evidence of thrombosis involving the IVC no evidence of thrombosis involving the right common iliac vein there is no evidence of thrombosis involving the right  external iliac vein the left external vein is also patent. The right common iliac vein stent appears patent bilateral common femoral veins are compressible and appear patent. I was concerned about the left common iliac stent however it looks like this is functional. She has some edema in the posterior thigh that was tender she still has that this week. I also note they had trouble finding the pulses in her left foot and booked her for an ABI baseline in 4 weeks. She will follow up in 6 months for repeat IVC duplex. The patient stopped the pentoxifylline because of diarrhea. It does not look like that was being effective in any case. I have advised her to go back on her aspirin 81 mg tablet, vascular it also suggested this 3/17; comes in today with her wound surfaces a lot better. The excoriations from last week considerably better probably secondary to the TCA. We have been using silver alginate 3/24; comes in today with smaller wounds both medially and laterally. Both required debridement. There are 2 small satellite areas superiorly laterally. She also has a very odd bandlike area in the mid calf almost looking like there was a weakness in the wrap in a localized area. I would write this off as being this however anteriorly she has a small raised ballotable area that is very tender almost reminiscent of an abscess but there was no obvious purulent surface to it.  02/04/20 upon evaluation today patient appears to be doing fairly well in regard to her wounds today. Fortunately there is no signs of active infection at this time. No fevers, chills, nausea, vomiting, or diarrhea. She has been tolerating the dressing changes without complication. Fortunately I feel like she is showing signs of improvement although has been sometime since have seen her. Nonetheless the area of concern that Dr. Dellia Nims had last week where she had possibly an area of the wrap that was we can allow the leg to bulge appears to  be doing significantly better today there is no signs of anything worsening. ADESUWA, OSGOOD ([id number]) 4/7; the patient's wounds on her medial and lateral left leg continue to contract. We have been using a regular alginate. Last week she developed an area on the right medial lower leg which is probably a venous ulcer as well. 4/14; the wounds on her left medial and lateral lower leg continue to contract. Surface eschar. We have been using regular alginate. The area on the right medial lower leg is closed. We have been putting both legs under 4-layer contraction. The patient went back to see vein and vascular she had arterial studies done which were apparently "quite good" per the patient although I have not read their notes I have never felt she had an arterial issue. 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 This is been longstanding and refractory to exercise, leg elevation and longstanding use of compression wraps in our clinic as well as compression stockings on the times we have been able to get these to heal 4/21; we thought she actually might be close this week however she arrives in clinic with a lot of edema in her upper left calf and into her posterior thigh. This is been an intermittent problem here. She says the wrap fell down but it was replaced with a nurse visit on Monday. We are using calcium alginate to the wounds and the wound sizes there not terribly larger than last week but there is a lot more edema 4/28; again wound edges are smaller on both sides. Her edema is better controlled than last time. She is obtained her compression pumps from [employer] although they have not been to her home to set these up. 5/5; left medial and left lateral both look stable. I am not sure the medial is any smaller. We have been using calcium alginate under 4-layer compression. She had an area on the right medial. This was eschared today. We  have been wrapping this as well. She does not tolerate external compression stockings due to a history of various contact allergies. She has her compression pumps however the representative from the company is coming on her to show her how to use these tomorrow 5/19; patient with severe chronic venous insufficiency secondary to central venous disease. She had a stent placed in her left common iliac vein. She has done better since but still difficult to control wounds. She comes in today with nothing open on the right leg. Her areas on the left medial and left lateral are just about closed. We are using calcium alginate under 4-layer compression. She is using her external compression pumps at home She only has 15-20 support stockings. States she cannot get anything tighter than that on. 03/30/20-Patient returns at 1 week, the wounds on the left leg are both slightly bigger, the last week she was on 3 layer compression which started to slide down. She is  starting to use her lymphedema pumps although she stated on 1 day her right ankle started to swell up and she have to stop that day. Unfortunately the open area seem to oscillate between improving to the point of healing and then flaring up all to do with effectiveness of compression or lack of due to the left leg topography not keeping the compression wraps from rolling down 6/2 patient comes in with a 15/20 mmHg stocking on the right leg. She tells me that she developed a lot of swelling in her ankles she saw orthopedics she was felt to possibly be having a flare of pseudogout versus some other type of arthritis. She was put on steroids for a respiratory issue so that helps with the inflammation. She has not been using the pumps all week. She thinks the left thigh is more swollen than usual and I would agree with that. She has an appointment with Dr. Donzetta Matters 9 days or so from now 6/9; both wounds on the left medial and left lateral are smaller. We have  been using calcium alginate under compression. She does not have an open wound on the right leg she is using a stocking and her compression pumps things are going well. She has an appointment with Dr. Donzetta Matters with regards to her stent in the left common iliac vein 6/16; the wounds on the left medial and left lateral ankle continues to contract. The patient saw Dr. Donzetta Matters and I think he seems satisfied. Ordered follow-up venous reflux studies on both sides in September. Cautioned that she may need thigh-high stockings. She has been using calcium alginate under compression on the left and her own stocking on the right leg. She tells [place] there are no open wounds on the right 6/23; left lateral is just about closed. Medial required debridement today. We have been using calcium alginate. Extensive discussion about the compression pumps she is only using these on 25 mmHg states she could not take 40 or 30 when the wrap came out to her home to demonstrate these. He said they should not feel tight 6/30; the left lateral wound has a slight amount of eschar. . The area medially is about the same using Hydrofera Blue. 7/7; left lateral wound still has some eschar. I will remove this next week may be closed. The area medially is very small using Hydrofera Blue with improvement. Unfortunately the stockings fell down. Unfortunately the blisters have developed at the edge of where the wrap fell. When this happened she says her legs hurt she did not use her pumps. We are not open Monday for her to come in and change the wraps and she had an appointment yesterday. She also tells me that she is going to have an MRI of her back. She is having pain radiating into her left anterior leg she thinks her from an L5 disc. She saw Dr. Ellene Route of neurosurgery 7/14; the area on the left lateral ankle area is closed. Still a small area medially however it looks better as well. We have been using Hydrofera Blue under 4-layer  compression 7/21; left lateral ankle is still closed however her wound on the medial left calf is actually larger. This is probably because Hydrofera Blue got stuck to the wound. She came in for a nurse change on Friday and will do that again this week I was concerned about the amount of swelling that she had last week however she is using her compression pumps twice a day and  the swelling seems well controlled Objective Constitutional Vitals Time Taken: 12:39 PM, Height: 63 in, Weight: 224.7 lbs, BMI: 39.8, Temperature: 98.3 F, Pulse: 68 bpm, Respiratory Rate: 16 breaths/min, Blood Pressure: 146/72 mmHg. Cardiovascular The edema control is excellent even up into her thigh. [name], MATSUMURA ([id number]) General Notes: Wound exam; the area on the left medial ankle it looks as though there is a skin tear. The wound surface looks quite healthy. Integumentary (Hair, Skin) Wound #5 status is Open. Original cause of wound was Gradually Appeared. The wound is located on the Left,Medial Lower Leg. The wound measures 2.4cm length x 3cm width x 0.1cm depth; 5.655cm^2 area and 0.565cm^3 volume. There is Fat Layer (Subcutaneous Tissue) Exposed exposed. There is no tunneling or undermining noted. There is a medium amount of serosanguineous drainage noted. There is large (67-100%) red granulation within the wound bed. There is a small (1-33%) amount of necrotic tissue within the wound bed including Adherent Slough. 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 There was a Four Layer Compression Therapy Procedure with a pre-treatment ABI of 1 by Cornell Barman, RN. Post procedure Diagnosis Wound #5: Same as Pre-Procedure Plan Anesthetic (add to Medication List): Wound #5 Left,Medial Lower Leg: Topical Lidocaine 4% cream applied to wound bed prior to debridement (In Clinic Only). Primary Wound Dressing: Wound #5 Left,Medial Lower Leg: Silver Collagen - moistened with hydrogel Secondary Dressing: Wound #5 Left,Medial Lower Leg: ABD pad Dressing Change Frequency: Wound #5 Left,Medial Lower Leg: Change dressing every week Other: - Nurse visit Friday and Monday if needed. Follow-up Appointments: Wound #5 Left,Medial Lower Leg: Return Appointment in 1 week. Nurse Visit as needed Edema Control: Wound #5 Left,Medial Lower Leg: 4-Layer Compression System - Left Lower Extremity. Patient to wear own compression stockings - On Right Compression Pump: Use compression pump on left lower extremity for 60 minutes, twice daily. Compression Pump: Use compression pump on right lower extremity for 60 minutes, twice daily. #1 I change the primary dressing to silver collagen other than that the same compression. She will be back for a nurse change on Friday BIANKA, LIBERATI ([id number]) Electronic Signature(s) Signed: 05/25/2020 4:41:04 PM By: Linton Ham MD Entered By: Linton Ham on 05/25/2020 13:04:37 RODNESHIA, GREENHOUSE ([id number]) -------------------------------------------------------------------------------- SuperBill Details Patient Name: MARISKA, DAFFIN. Date of Service: 05/25/2020 Medical Record Number: [id number] Patient Account Number: [id number] Date of Birth/Sex: 1946-05-17 (74 y.o. 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 Facility Procedures CPT4 Code: 91638466 Description: (Facility Use Only) 29581LT - APPLY MULTLAY COMPRS LWR LT LEG Modifier: Quantity: 1 Physician Procedures CPT4 Code Description: [id number] - WC PHYS LEVEL 3 - EST PT  Modifier: Quantity: 1 CPT4 Code Description: ICD-10 Diagnosis Description L97.221 Non-pressure chronic ulcer of left calf limited to breakdown of skin I89.0 Lymphedema, not elsewhere classified I87.332 Chronic venous hypertension (idiopathic) with ulcer and inflammation of Modifier: left lower extremity Quantity: Electronic Signature(s) Signed: 05/25/2020 4:41:04 PM By: Linton Ham MD Entered By: Linton Ham on 05/25/2020 13:05:02

## 2020-05-27 NOTE — Progress Notes (Signed)
[name] ([id number]) Visit Report for 05/25/2020 Arrival Information Details Patient Name: [name], [name]. Date of Service: 05/25/2020 12:30 PM Medical Record Number: [id number] Patient Account Number: [id number] Date of Birth/Sex: [birth year] (74 y.o. F) Treating RN: Army Melia Primary Care Zerek Litsey: [name] Other Clinician: Referring Deaundre Allston: [name] Treating Rayel Santizo/Extender: Tito Dine in Treatment: 92 Visit Information History Since Last Visit Added or deleted any medications: No Patient Arrived: Ambulatory Any new allergies or adverse reactions: No Arrival Time: 12:38 Had a fall or experienced change in No Accompanied By: self activities of daily living that may affect Transfer Assistance: None risk of falls: Patient Identification Verified: Yes Signs or symptoms of abuse/neglect since last visito No Patient Requires Transmission-Based No Hospitalized since last visit: No Precautions: Has Dressing in Place as Prescribed: Yes Patient Has Alerts: Yes Pain Present Now: No Patient Alerts: Patient on Blood Thinner aspirin 81 Electronic Signature(s) Signed: 05/25/2020 4:24:58 PM By: Army Melia Entered By: Army Melia on 05/25/2020 12:38:58 Ceci, [name] ([id number]) -------------------------------------------------------------------------------- Compression Therapy Details Patient Name: [name]. Date of Service: 05/25/2020 12:30 PM Medical Record Number: [id number] Patient Account Number: [id number] Date of Birth/Sex: 1946/07/27 (74 y.o. F) Treating RN: Cornell Barman Primary Care Jovanne Riggenbach: [name] Other Clinician: Referring Rayshard Schirtzinger: [name] Treating Janari Gagner/Extender: Tito Dine in Treatment: 76 Compression Therapy Performed for Wound Assessment: Wound #5 Left,Medial Lower Leg Performed By: Clinician Cornell Barman, RN Compression Type: Four Layer Pre Treatment ABI: 1 Post Procedure  Diagnosis Same as Pre-procedure Electronic Signature(s) Signed: 05/26/2020 6:25:29 PM By: [name], [name], RN, CWS, [name], [name] Entered By: [name], [name], RN, CWS, Kim on 05/25/2020 12:53:52 Houpt, [name] ([id number]) -------------------------------------------------------------------------------- Encounter Discharge Information Details Patient Name: [name]. Date of Service: 05/25/2020 12:30 PM Medical Record Number: [id number] Patient Account Number: [id number] Date of Birth/Sex: Aug 27, 1946 (74 y.o. F) Treating RN: Cornell Barman Primary Care Taveon Enyeart: [name] Other Clinician: Referring Deniese Oberry: [name] Treating Ubaldo Daywalt/Extender: Tito Dine in Treatment: 2 Encounter Discharge Information Items Discharge Condition: Stable Ambulatory Status: Ambulatory Discharge Destination: Home Transportation: Private Auto Accompanied By: self Schedule Follow-up Appointment: Yes Clinical Summary of Care: Electronic Signature(s) Signed: 05/26/2020 6:25:29 PM By: [name], [name], RN, CWS, [name], [name] Entered By: [name], [name], RN, CWS, Kim on 05/25/2020 12:59:30 [name] ([id number]) -------------------------------------------------------------------------------- Lower Extremity Assessment Details Patient Name: [name]. Date of Service: 05/25/2020 12:30 PM Medical Record Number: [id number] Patient Account Number: [id number] Date of Birth/Sex: 04-14-1946 (74 y.o. F) Treating RN: Army Melia Primary Care Tj Kitchings: [name] Other Clinician: Referring Edelmiro Innocent: [name] Treating Ediel Unangst/Extender: Tito Dine in Treatment: 76 Edema Assessment Assessed: [Left: No] [Right: No] Edema: [Left: Ye] [Right: s] Calf Left: Right: Point of Measurement: 33 cm From Medial Instep 51 cm cm Ankle Left: Right: Point of Measurement: 10 cm From Medial Instep 24 cm cm Vascular Assessment Pulses: Dorsalis Pedis Palpable: [Left:Yes] Electronic  Signature(s) Signed: 05/25/2020 4:24:58 PM By: Army Melia Entered By: Army Melia on 05/25/2020 12:46:58 Curiale, [name] ([id number]) -------------------------------------------------------------------------------- Multi Wound Chart Details Patient Name: [name]. Date of Service: 05/25/2020 12:30 PM Medical Record Number: [id number] Patient Account Number: [id number] Date of Birth/Sex: 02-09-1946 (74 y.o. F) Treating RN: Cornell Barman Primary Care Khai Torbert: [name] Other Clinician: Referring Samiel Peel: [name] Treating Tysheena Ginzburg/Extender: Tito Dine in Treatment: 76 Vital Signs Height(in): 63 Pulse(bpm): 68 Weight(lbs): 224.7 Blood Pressure(mmHg): 146/72 Body Mass Index(BMI): 40 Temperature(F): 98.3 Respiratory Rate(breaths/min): 16 Photos: [N/A:N/A]  Wound Location: Left, Medial Lower Leg N/A N/A Wounding Event: Gradually Appeared N/A N/A Primary Etiology: Lymphedema N/A N/A Comorbid History: Cataracts, Asthma, Sleep Apnea, N/A N/A Deep Vein Thrombosis, Hypertension, Peripheral Venous Disease, Osteoarthritis, Received Chemotherapy, Received Radiation Date Acquired: 11/19/2018 N/A N/A Weeks of Treatment: 76 N/A N/A Wound Status: Open N/A N/A Measurements L x W x D (cm) 2.4x3x0.1 N/A N/A Area (cm) : 5.655 N/A N/A Volume (cm) : 0.565 N/A N/A % Reduction in Area: 33.80% N/A N/A % Reduction in Volume: 33.90% N/A N/A Classification: Full Thickness Without Exposed N/A N/A Support Structures Exudate Amount: Medium N/A N/A Exudate Type: Serosanguineous N/A N/A Exudate Color: red, brown N/A N/A Granulation Amount: Large (67-100%) N/A N/A Granulation Quality: Red N/A N/A Necrotic Amount: Small (1-33%) N/A N/A Exposed Structures: Fat Layer (Subcutaneous Tissue) N/A N/A Exposed: Yes Fascia: No Tendon: No Muscle: No Joint: No Bone: No Epithelialization: None N/A N/A Procedures Performed: Compression Therapy N/A N/A Treatment  Notes Electronic Signature(s) Signed: 05/25/2020 4:41:04 PM By: Linton Ham MD Entered By: Linton Ham on 05/25/2020 12:58:08 SHANYIA, STINES ([id number] Lango, [name] ([id number]) -------------------------------------------------------------------------------- Multi-Disciplinary Care Plan Details Patient Name: AMARI, BURNSWORTH. Date of Service: 05/25/2020 12:30 PM Medical Record Number: [id number] Patient Account Number: [id number] Date of Birth/Sex: Jun 24, 1946 (74 y.o. F) Treating RN: Cornell Barman Primary Care Robie Oats: [name] Other Clinician: Referring Shawntia Mangal: [name] Treating Hicks Feick/Extender: Tito Dine in Treatment: 49 Active Inactive Medication Nursing Diagnoses: Knowledge deficit related to medication safety: actual or potential Goals: Patient/caregiver will demonstrate understanding of new oral/IV medications prescribed at the [facility] (topical prescriptions are covered under the skin breakdown problem) Date Initiated: 12/16/2019 Target Resolution Date: 01/13/2020 Goal Status: Active Interventions: Assess for medication contraindications each visit where new medications are prescribed Treatment Activities: New medication prescribed at [facility] : 12/16/2019 Notes: Soft Tissue Infection Nursing Diagnoses: Impaired tissue integrity Goals: Patient's soft tissue infection will resolve Date Initiated: 12/10/2018 Target Resolution Date: 01/09/2019 Goal Status: Active Interventions: Assess signs and symptoms of infection every visit Notes: Venous Leg Ulcer Nursing Diagnoses: Actual venous Insuffiency (use after diagnosis is confirmed) Goals: Patient will maintain optimal edema control Date Initiated: 12/10/2018 Target Resolution Date: 01/09/2019 Goal Status: Active Interventions: Assess peripheral edema status every visit. Treatment Activities: Therapeutic compression applied : 12/10/2018 Notes: Wound/Skin Impairment Nursing  Diagnoses: SHANIA, BJELLAND ([id number]) Impaired tissue integrity Goals: Patient/caregiver will verbalize understanding of skin care regimen Date Initiated: 12/10/2018 Target Resolution Date: 01/09/2019 Goal Status: Active Interventions: Assess ulceration(s) every visit Treatment Activities: Topical wound management initiated : 12/10/2018 Notes: Electronic Signature(s) Signed: 05/26/2020 6:25:29 PM By: [name], [name], RN, CWS, [name], [name] Entered By: [name], [name], RN, CWS, Kim on 05/25/2020 12:53:17 [name] ([id number]) -------------------------------------------------------------------------------- Pain Assessment Details Patient Name: GENIE, WENKE. Date of Service: 05/25/2020 12:30 PM Medical Record Number: [id number] Patient Account Number: [id number] Date of Birth/Sex: [birth year] (74 y.o. F) Treating RN: Army Melia Primary Care Imanii Gosdin: [name] Other Clinician: Referring Yoshika Vensel: [name] Treating Zamani Crocker/Extender: Tito Dine in Treatment: 4 Active Problems Location of Pain Severity and Description of Pain Patient Has Paino No Site Locations Pain Management and Medication Current Pain Management: Electronic Signature(s) Signed: 05/25/2020 4:24:58 PM By: Army Melia Entered By: Army Melia on 05/25/2020 12:39:43 Herter, [name] ([id number]) -------------------------------------------------------------------------------- Patient/Caregiver Education Details Patient Name: TAYLLOR, BREITENSTEIN. Date of Service: 05/25/2020 12:30 PM Medical Record Number: [id number] Patient Account Number: [id number] Date of Birth/Gender: [birth year] (74 y.o. F) Treating RN: Cornell Barman Primary  Care Physician: [name] Other Clinician: Referring Physician: [name] Treating Physician/Extender: Tito Dine in Treatment: 5 Education Assessment Education Provided To: Patient Education Topics Provided Venous: Handouts: Controlling  Swelling with Multilayered Compression Wraps Methods: Demonstration, Explain/Verbal Responses: State content correctly Electronic Signature(s) Signed: 05/26/2020 6:25:29 PM By: [name], [name], RN, CWS, [name], [name] Entered By: [name], [name], RN, CWS, Kim on 05/25/2020 12:55:28 [name] ([id number]) -------------------------------------------------------------------------------- Wound Assessment Details Patient Name: FRANCIES, INCH. Date of Service: 05/25/2020 12:30 PM Medical Record Number: [id number] Patient Account Number: [id number] Date of Birth/Sex: [birth year] (74 y.o. F) Treating RN: Army Melia Primary Care Rhyder Koegel: [name] Other Clinician: Referring Xzander Gilham: [name] Treating Morell Mears/Extender: Tito Dine in Treatment: 76 Wound Status Wound Number: 5 Primary Lymphedema Etiology: Wound Location: Left, Medial Lower Leg Wound Open Wounding Event: Gradually Appeared Status: Date Acquired: 11/19/2018 Comorbid Cataracts, Asthma, Sleep Apnea, Deep Vein Thrombosis, Weeks Of Treatment: 76 History: Hypertension, Peripheral Venous Disease, Osteoarthritis, Clustered Wound: No Received Chemotherapy, Received Radiation Photos Wound Measurements Length: (cm) 2.4 Width: (cm) 3 Depth: (cm) 0.1 Area: (cm) 5.655 Volume: (cm) 0.565 % Reduction in Area: 33.8% % Reduction in Volume: 33.9% Epithelialization: None Tunneling: No Undermining: No Wound Description Classification: Full Thickness Without Exposed Support Structures Exudate Amount: Medium Exudate Type: Serosanguineous Exudate Color: red, brown Foul Odor After Cleansing: No Slough/Fibrino Yes Wound Bed Granulation Amount: Large (67-100%) Exposed Structure Granulation Quality: Red Fascia Exposed: No Necrotic Amount: Small (1-33%) Fat Layer (Subcutaneous Tissue) Exposed: Yes Necrotic Quality: Adherent Slough Tendon Exposed: No Muscle Exposed: No Joint Exposed: No Bone Exposed:  No Treatment Notes Wound #5 (Left, Medial Lower Leg) Notes Prisma Ag, hydrogel, ABD, 4 layer unna to anchor Electronic Signature(s) Signed: 05/25/2020 4:24:58 PM By: Renee Rival ([id number]) Entered By: Army Melia on 05/25/2020 [place], [name] ([id number]) -------------------------------------------------------------------------------- Vitals Details Patient Name: KELLEN, HOVER. Date of Service: 05/25/2020 12:30 PM Medical Record Number: [id number] Patient Account Number: [id number] Date of Birth/Sex: June 10, 1946 (74 y.o. F) Treating RN: Army Melia Primary Care Zayra Devito: [name] Other Clinician: Referring Sherina Stammer: [name] Treating Teniya Filter/Extender: Tito Dine in Treatment: 76 Vital Signs Time Taken: 12:39 Temperature (F): 98.3 Height (in): 63 Pulse (bpm): 68 Weight (lbs): 224.7 Respiratory Rate (breaths/min): 16 Body Mass Index (BMI): 39.8 Blood Pressure (mmHg): 146/72 Reference Range: 80 - 120 mg / dl Electronic Signature(s) Signed: 05/25/2020 4:24:58 PM By: Army Melia Entered By: Army Melia on 05/25/2020 12:39:20

## 2020-05-30 ENCOUNTER — Other Ambulatory Visit: Payer: Self-pay

## 2020-05-30 DIAGNOSIS — I878 Other specified disorders of veins: Secondary | ICD-10-CM | POA: Diagnosis not present

## 2020-05-30 DIAGNOSIS — R768 Other specified abnormal immunological findings in serum: Secondary | ICD-10-CM | POA: Diagnosis not present

## 2020-05-30 DIAGNOSIS — I82592 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: Secondary | ICD-10-CM | POA: Diagnosis not present

## 2020-05-30 DIAGNOSIS — L97221 Non-pressure chronic ulcer of left calf limited to breakdown of skin: Secondary | ICD-10-CM | POA: Diagnosis not present

## 2020-05-30 DIAGNOSIS — M1120 Other chondrocalcinosis, unspecified site: Secondary | ICD-10-CM | POA: Diagnosis not present

## 2020-05-30 DIAGNOSIS — I890 Lymphedema, not elsewhere classified: Secondary | ICD-10-CM | POA: Diagnosis not present

## 2020-05-30 DIAGNOSIS — M1990 Unspecified osteoarthritis, unspecified site: Secondary | ICD-10-CM | POA: Diagnosis not present

## 2020-05-30 DIAGNOSIS — I83009 Varicose veins of unspecified lower extremity with ulcer of unspecified site: Secondary | ICD-10-CM | POA: Diagnosis not present

## 2020-05-30 DIAGNOSIS — S81802A Unspecified open wound, left lower leg, initial encounter: Secondary | ICD-10-CM | POA: Diagnosis not present

## 2020-05-30 DIAGNOSIS — I87332 Chronic venous hypertension (idiopathic) with ulcer and inflammation of left lower extremity: Secondary | ICD-10-CM | POA: Diagnosis not present

## 2020-05-31 NOTE — Progress Notes (Signed)
[name], [name] ([id number]) Visit Report for 05/27/2020 Arrival Information Details Patient Name: [name], [name]. Date of Service: 05/27/2020 12:45 PM Medical Record Number: [id number] Patient Account Number: [id number] Date of Birth/Sex: 1946/10/22 (74 y.o. F) Treating RN: [name] Canavan Primary Care Teresita Fanton: [name] Other Clinician: Referring Myleah Cavendish: [name] Treating Adeola Dennen/Extender: Melburn Hake, HOYT Weeks in Treatment: 76 Visit Information History Since Last Visit Added or deleted any medications: No Patient Arrived: Ambulatory Had a fall or experienced change in No Arrival Time: 12:50 activities of daily living that may affect Accompanied By: alone risk of falls: Transfer Assistance: None Hospitalized since last visit: No Patient Identification Verified: Yes Pain Present Now: Yes Secondary Verification Process Completed: Yes Patient Requires Transmission-Based No Precautions: Patient Has Alerts: Yes Patient Alerts: Patient on Blood Thinner aspirin 81 Electronic Signature(s) Signed: 05/31/2020 9:53:36 AM By: [name] Canavan Entered By: [name] Canavan on 05/27/2020 13:01:00 [name] ([id number]) -------------------------------------------------------------------------------- Clinic Level of Care Assessment Details Patient Name: [name], [name]. Date of Service: 05/27/2020 12:45 PM Medical Record Number: [id number] Patient Account Number: [id number] Date of Birth/Sex: 08-28-1946 (74 y.o. F) Treating RN: [name] Canavan Primary Care Lindell Tussey: [name] Other Clinician: Referring Starsha Morning: [name] Treating Robena Ewy/Extender: Melburn Hake, HOYT Weeks in Treatment: 74 Clinic Level of Care Assessment Items TOOL 4 Quantity Score []  - Use when only an EandM is performed on FOLLOW-UP visit 0 ASSESSMENTS - Nursing Assessment / Reassessment X - Reassessment of Co-morbidities (includes updates in patient status) 1 10 X- 1  5 Reassessment of Adherence to Treatment Plan ASSESSMENTS - Wound and Skin Assessment / Reassessment X - Simple Wound Assessment / Reassessment - one wound 1 5 []  - 0 Complex Wound Assessment / Reassessment - multiple wounds []  - 0 Dermatologic / Skin Assessment (not related to wound area) ASSESSMENTS - Focused Assessment []  - Circumferential Edema Measurements - multi extremities 0 []  - 0 Nutritional Assessment / Counseling / Intervention X- 1 5 Lower Extremity Assessment (monofilament, tuning fork, pulses) []  - 0 Peripheral Arterial Disease Assessment (using hand held doppler) ASSESSMENTS - Ostomy and/or Continence Assessment and Care []  - Incontinence Assessment and Management 0 []  - 0 Ostomy Care Assessment and Management (repouching, etc.) PROCESS - Coordination of Care X - Simple Patient / Family Education for ongoing care 1 15 []  - 0 Complex (extensive) Patient / Family Education for ongoing care []  - 0 Staff obtains Programmer, systems, Records, Test Results / Process Orders []  - 0 Staff telephones HHA, Nursing Homes / Clarify orders / etc []  - 0 Routine Transfer to another Facility (non-emergent condition) []  - 0 Routine Hospital Admission (non-emergent condition) []  - 0 New Admissions / Biomedical engineer / Ordering NPWT, Apligraf, etc. []  - 0 Emergency Hospital Admission (emergent condition) X- 1 10 Simple Discharge Coordination []  - 0 Complex (extensive) Discharge Coordination PROCESS - Special Needs []  - Pediatric / Minor Patient Management 0 []  - 0 Isolation Patient Management []  - 0 Hearing / Language / Visual special needs []  - 0 Assessment of Community assistance (transportation, D/C planning, etc.) []  - 0 Additional assistance / Altered mentation []  - 0 Support Surface(s) Assessment (bed, cushion, seat, etc.) INTERVENTIONS - Wound Cleansing / Measurement [name], [name] ([id number]) X- 1 5 Simple Wound Cleansing - one wound []  - 0 Complex Wound  Cleansing - multiple wounds []  - 0 Wound Imaging (photographs - any number of wounds) []  - 0 Wound Tracing (instead of photographs) X- 1 5 Simple Wound Measurement - one wound []  - 0  Complex Wound Measurement - multiple wounds INTERVENTIONS - Wound Dressings X - Small Wound Dressing one or multiple wounds 1 10 []  - 0 Medium Wound Dressing one or multiple wounds []  - 0 Large Wound Dressing one or multiple wounds []  - 0 Application of Medications - topical []  - 0 Application of Medications - injection INTERVENTIONS - Miscellaneous []  - External ear exam 0 []  - 0 Specimen Collection (cultures, biopsies, blood, body fluids, etc.) []  - 0 Specimen(s) / Culture(s) sent or taken to Lab for analysis []  - 0 Patient Transfer (multiple staff / Civil Service fast streamer / Similar devices) []  - 0 Simple Staple / Suture removal (25 or less) []  - 0 Complex Staple / Suture removal (26 or more) []  - 0 Hypo / Hyperglycemic Management (close monitor of Blood Glucose) []  - 0 Ankle / Brachial Index (ABI) - do not check if billed separately X- 1 5 Vital Signs Has the patient been seen at the hospital within the last three years: Yes Total Score: 75 Level Of Care: New/Established - Level 2 Electronic Signature(s) Signed: 05/31/2020 9:53:36 AM By: [name] Canavan Entered By: [name] Canavan on 05/27/2020 13:07:17 [name], [name] ([id number]) -------------------------------------------------------------------------------- Compression Therapy Details Patient Name: [name], [name]. Date of Service: 05/27/2020 12:45 PM Medical Record Number: [id number] Patient Account Number: [id number] Date of Birth/Sex: 11-20-1945 (74 y.o. F) Treating RN: [name] Canavan Primary Care Jerian Morais: [name] Other Clinician: Referring Tristine Langi: [name] Treating Lashawne Dura/Extender: Melburn Hake, HOYT Weeks in Treatment: 76 Compression Therapy Performed for Wound Assessment: Wound #5 Left,Medial Lower  Leg Performed By: Ponciano Ort, RN Compression Type: Four Layer Pre Treatment ABI: 1 Electronic Signature(s) Signed: 05/31/2020 9:53:36 AM By: [name] Canavan Entered By: [name] Canavan on 05/27/2020 13:05:52 [name], [name] ([id number]) -------------------------------------------------------------------------------- Encounter Discharge Information Details Patient Name: [name], [name]. Date of Service: 05/27/2020 12:45 PM Medical Record Number: [id number] Patient Account Number: [id number] Date of Birth/Sex: Sep 03, 1946 (74 y.o. F) Treating RN: [name] Canavan Primary Care Markevious Ehmke: [name] Other Clinician: Referring Dayvon Dax: [name] Treating Rozalynn Buege/Extender: Melburn Hake, HOYT Weeks in Treatment: 75 Encounter Discharge Information Items Discharge Condition: Stable Ambulatory Status: Ambulatory Discharge Destination: Home Transportation: Private Auto Accompanied By: alone Schedule Follow-up Appointment: Yes Clinical Summary of Care: Electronic Signature(s) Signed: 05/31/2020 9:53:36 AM By: [name] Canavan Entered By: [name] Canavan on 05/27/2020 13:06:49 [name], [name] ([id number]) -------------------------------------------------------------------------------- Wound Assessment Details Patient Name: [name], [name]. Date of Service: 05/27/2020 12:45 PM Medical Record Number: [id number] Patient Account Number: [id number] Date of Birth/Sex: [birth year] (74 y.o. F) Treating RN: [name] Canavan Primary Care Niza Soderholm: [name] Other Clinician: Referring Meeghan Skipper: [name] Treating Carney Saxton/Extender: Melburn Hake, HOYT Weeks in Treatment: 57 Wound Status Wound Number: 5 Primary Lymphedema Etiology: Wound Location: Left, Medial Lower Leg Wound Open Wounding Event: Gradually Appeared Status: Date Acquired: 11/19/2018 Comorbid Cataracts, Asthma, Sleep Apnea, Deep Vein Thrombosis, Weeks Of Treatment: 76 History: Hypertension,  Peripheral Venous Disease, Osteoarthritis, Clustered Wound: No Received Chemotherapy, Received Radiation Photos Wound Measurements Length: (cm) 2.5 Width: (cm) 3 Depth: (cm) 0.2 Area: (cm) 5.89 Volume: (cm) 1.178 % Reduction in Area: 31.1% % Reduction in Volume: -37.8% Epithelialization: None Tunneling: No Undermining: No Wound Description Classification: Full Thickness Without Exposed Support Structu Wound Margin: Flat and Intact Exudate Amount: Medium Exudate Type: Serosanguineous Exudate Color: red, brown res Foul Odor After Cleansing: No Slough/Fibrino Yes Wound Bed Granulation Amount: Large (67-100%) Exposed Structure Granulation Quality: Red Fascia Exposed: No Necrotic Amount: Small (1-33%) Fat Layer (Subcutaneous Tissue) Exposed: Yes Necrotic Quality: Adherent  Slough Tendon Exposed: No Muscle Exposed: No Joint Exposed: No Bone Exposed: No Electronic Signature(s) Signed: 05/31/2020 9:53:36 AM By: [name] Canavan Entered By: [name] Canavan on 05/27/2020 13:05:03

## 2020-05-31 NOTE — Progress Notes (Signed)
[name], [name] ([id number]) Visit Report for 05/30/2020 Arrival Information Details Patient Name: [name], [name]. Date of Service: 05/30/2020 8:45 AM Medical Record Number: [id number] Patient Account Number: [id number] Date of Birth/Sex: Sep 21, 1946 (74 y.o. F) Treating RN: [name] Canavan Primary Care Davanta Meuser: [name] Other Clinician: Referring Tarina Volk: [name] Treating Curtistine Pettitt/Extender: Melburn Hake, HOYT Weeks in Treatment: 76 Visit Information History Since Last Visit Added or deleted any medications: No Patient Arrived: Ambulatory Had a fall or experienced change in No Arrival Time: 08:50 activities of daily living that may affect Accompanied By: self risk of falls: Transfer Assistance: None Hospitalized since last visit: No Patient Requires Transmission-Based No Has Footwear/Offloading in Place as Prescribed: Yes Precautions: Pain Present Now: No Patient Has Alerts: Yes Patient Alerts: Patient on Blood Thinner aspirin 81 Electronic Signature(s) Signed: 05/31/2020 9:53:36 AM By: [name] Canavan Entered By: [name] Canavan on 05/30/2020 08:58:38 Suddreth, [name] ([id number]) -------------------------------------------------------------------------------- Clinic Level of Care Assessment Details Patient Name: [name], [name]. Date of Service: 05/30/2020 8:45 AM Medical Record Number: [id number] Patient Account Number: [id number] Date of Birth/Sex: 21-Mar-1946 (74 y.o. F) Treating RN: [name] Canavan Primary Care Warden Buffa: [name] Other Clinician: Referring Rilyn Upshaw: [name] Treating Rudolph Dobler/Extender: Melburn Hake, HOYT Weeks in Treatment: 28 Clinic Level of Care Assessment Items TOOL 4 Quantity Score []  - Use when only an EandM is performed on FOLLOW-UP visit 0 ASSESSMENTS - Nursing Assessment / Reassessment X - Reassessment of Co-morbidities (includes updates in patient status) 1 10 X- 1 5 Reassessment of Adherence to Treatment  Plan ASSESSMENTS - Wound and Skin Assessment / Reassessment X - Simple Wound Assessment / Reassessment - one wound 1 5 []  - 0 Complex Wound Assessment / Reassessment - multiple wounds []  - 0 Dermatologic / Skin Assessment (not related to wound area) ASSESSMENTS - Focused Assessment []  - Circumferential Edema Measurements - multi extremities 0 []  - 0 Nutritional Assessment / Counseling / Intervention []  - 0 Lower Extremity Assessment (monofilament, tuning fork, pulses) []  - 0 Peripheral Arterial Disease Assessment (using hand held doppler) ASSESSMENTS - Ostomy and/or Continence Assessment and Care []  - Incontinence Assessment and Management 0 []  - 0 Ostomy Care Assessment and Management (repouching, etc.) PROCESS - Coordination of Care X - Simple Patient / Family Education for ongoing care 1 15 []  - 0 Complex (extensive) Patient / Family Education for ongoing care []  - 0 Staff obtains Programmer, systems, Records, Test Results / Process Orders []  - 0 Staff telephones HHA, Nursing Homes / Clarify orders / etc []  - 0 Routine Transfer to another Facility (non-emergent condition) []  - 0 Routine Hospital Admission (non-emergent condition) []  - 0 New Admissions / Biomedical engineer / Ordering NPWT, Apligraf, etc. []  - 0 Emergency Hospital Admission (emergent condition) X- 1 10 Simple Discharge Coordination []  - 0 Complex (extensive) Discharge Coordination PROCESS - Special Needs []  - Pediatric / Minor Patient Management 0 []  - 0 Isolation Patient Management []  - 0 Hearing / Language / Visual special needs []  - 0 Assessment of Community assistance (transportation, D/C planning, etc.) []  - 0 Additional assistance / Altered mentation []  - 0 Support Surface(s) Assessment (bed, cushion, seat, etc.) INTERVENTIONS - Wound Cleansing / Measurement Warn, Lowell J. ([id number]) X- 1 5 Simple Wound Cleansing - one wound []  - 0 Complex Wound Cleansing - multiple wounds X- 1 5 Wound  Imaging (photographs - any number of wounds) []  - 0 Wound Tracing (instead of photographs) X- 1 5 Simple Wound Measurement - one wound []  - 0 Complex Wound  Measurement - multiple wounds INTERVENTIONS - Wound Dressings X - Small Wound Dressing one or multiple wounds 1 10 []  - 0 Medium Wound Dressing one or multiple wounds []  - 0 Large Wound Dressing one or multiple wounds []  - 0 Application of Medications - topical []  - 0 Application of Medications - injection INTERVENTIONS - Miscellaneous []  - External ear exam 0 []  - 0 Specimen Collection (cultures, biopsies, blood, body fluids, etc.) []  - 0 Specimen(s) / Culture(s) sent or taken to Lab for analysis []  - 0 Patient Transfer (multiple staff / Civil Service fast streamer / Similar devices) []  - 0 Simple Staple / Suture removal (25 or less) []  - 0 Complex Staple / Suture removal (26 or more) []  - 0 Hypo / Hyperglycemic Management (close monitor of Blood Glucose) []  - 0 Ankle / Brachial Index (ABI) - do not check if billed separately []  - 0 Vital Signs Has the patient been seen at the hospital within the last three years: Yes Total Score: 70 Level Of Care: New/Established - Level 2 Electronic Signature(s) Signed: 05/31/2020 9:53:36 AM By: [name] Canavan Entered By: [name] Canavan on 05/30/2020 09:05:47 Adderley, [name] ([id number]) -------------------------------------------------------------------------------- Compression Therapy Details Patient Name: [name], [name]. Date of Service: 05/30/2020 8:45 AM Medical Record Number: [id number] Patient Account Number: [id number] Date of Birth/Sex: 1946-04-20 (74 y.o. F) Treating RN: Cornell Barman Primary Care Jaline Pincock: [name] Other Clinician: Referring Temima Kutsch: [name] Treating Deepti Gunawan/Extender: Melburn Hake, HOYT Weeks in Treatment: 76 Compression Therapy Performed for Wound Assessment: Wound #5 Left,Medial Lower Leg Performed By: Clinician Cornell Barman, RN Compression Type:  Four Layer Pre Treatment ABI: 1 Electronic Signature(s) Signed: 05/30/2020 12:39:22 PM By: [name], [name], RN, CWS, [name], [name] Entered By: [name], [name], RN, CWS, Kim on 05/30/2020 12:39:22 [name] ([id number]) -------------------------------------------------------------------------------- Encounter Discharge Information Details Patient Name: [name], [name]. Date of Service: 05/30/2020 8:45 AM Medical Record Number: [id number] Patient Account Number: [id number] Date of Birth/Sex: [birth year] (74 y.o. F) Treating RN: [name] Canavan Primary Care Remo Kirschenmann: [name] Other Clinician: Referring Baron Parmelee: [name] Treating Moani Weipert/Extender: Melburn Hake, HOYT Weeks in Treatment: 77 Encounter Discharge Information Items Discharge Condition: Stable Ambulatory Status: Ambulatory Discharge Destination: Home Transportation: Private Auto Accompanied By: self Schedule Follow-up Appointment: Yes Clinical Summary of Care: Electronic Signature(s) Signed: 05/31/2020 9:53:36 AM By: [name] Canavan Entered By: [name] Canavan on 05/30/2020 09:04:56 Mechling, [name] ([id number]) -------------------------------------------------------------------------------- Wound Assessment Details Patient Name: [name], [name]. Date of Service: 05/30/2020 8:45 AM Medical Record Number: [id number] Patient Account Number: [id number] Date of Birth/Sex: 08-23-1946 (74 y.o. F) Treating RN: [name] Canavan Primary Care [name] Phette[place]: [name] Other Clinician: Referring Janthony Holleman: [name] Treating Coleta Grosshans/Extender: Melburn Hake, HOYT Weeks in Treatment: 49 Wound Status Wound Number: 5 Primary Lymphedema Etiology: Wound Location: Left, Medial Lower Leg Wound Open Wounding Event: Gradually Appeared Status: Date Acquired: 11/19/2018 Comorbid Cataracts, Asthma, Sleep Apnea, Deep Vein Thrombosis, Weeks Of Treatment: 76 History: Hypertension, Peripheral Venous Disease,  Osteoarthritis, Clustered Wound: No Received Chemotherapy, Received Radiation Photos Wound Measurements Length: (cm) 2.2 Width: (cm) 2.4 Depth: (cm) 0.2 Area: (cm) 4.147 Volume: (cm) 0.829 % Reduction in Area: 51.5% % Reduction in Volume: 3% Epithelialization: Small (1-33%) Tunneling: No Undermining: No Wound Description Classification: Full Thickness Without Exposed Support Structu Wound Margin: Flat and Intact Exudate Amount: Medium Exudate Type: Serosanguineous Exudate Color: red, brown res Foul Odor After Cleansing: No Slough/Fibrino Yes Wound Bed Granulation Amount: Large (67-100%) Exposed Structure Granulation Quality: Red Fascia Exposed: No Necrotic Amount: Small (1-33%) Fat Layer (  Subcutaneous Tissue) Exposed: Yes Necrotic Quality: Adherent Slough Tendon Exposed: No Muscle Exposed: No Joint Exposed: No Bone Exposed: No Treatment Notes Wound #5 (Left, Medial Lower Leg) Notes Prisma Ag, hydrogel, ABD, 4 layer unna to anchor Electronic Signature(s) [name], [name] ([id number]) Signed: 05/31/2020 9:53:36 AM By: [name] Canavan Entered By: [name] Canavan on 05/30/2020 09:03:53

## 2020-06-01 ENCOUNTER — Other Ambulatory Visit: Payer: Self-pay

## 2020-06-01 ENCOUNTER — Encounter: Payer: Medicare Other | Admitting: Internal Medicine

## 2020-06-01 DIAGNOSIS — I890 Lymphedema, not elsewhere classified: Secondary | ICD-10-CM | POA: Diagnosis not present

## 2020-06-01 DIAGNOSIS — I87332 Chronic venous hypertension (idiopathic) with ulcer and inflammation of left lower extremity: Secondary | ICD-10-CM | POA: Diagnosis not present

## 2020-06-01 DIAGNOSIS — L97221 Non-pressure chronic ulcer of left calf limited to breakdown of skin: Secondary | ICD-10-CM | POA: Diagnosis not present

## 2020-06-01 DIAGNOSIS — I82592 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: Secondary | ICD-10-CM | POA: Diagnosis not present

## 2020-06-01 DIAGNOSIS — L97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d: Secondary | ICD-10-CM | POA: Diagnosis not present

## 2020-06-01 DIAGNOSIS — I83009 Varicose veins of unspecified lower extremity with ulcer of unspecified site: Secondary | ICD-10-CM | POA: Diagnosis not present

## 2020-06-01 NOTE — Progress Notes (Signed)
KAREL, MOWERS ([id number]) Visit Report for 06/01/2020 Arrival Information Details Patient Name: [name], [name]. Date of Service: 06/01/2020 12:30 PM Medical Record Number: [id number] Patient Account Number: [id number] Date of Birth/Sex: 09/02/1946 (74 y.o. F) Treating RN: Cornell Barman Primary Care Tayana Shankle: [name] Other Clinician: Referring Kellianne Ek: [name] Treating Jair Lindblad/Extender: Tito Dine in Treatment: 75 Visit Information History Since Last Visit All ordered tests and consults were completed: No Patient Arrived: Ambulatory Added or deleted any medications: No Arrival Time: 12:39 Any new allergies or adverse reactions: No Accompanied By: self Had a fall or experienced change in No Transfer Assistance: None activities of daily living that may affect Patient Identification Verified: Yes risk of falls: Secondary Verification Process Completed: Yes Signs or symptoms of abuse/neglect since last visito No Patient Requires Transmission-Based No Hospitalized since last visit: No Precautions: Has Dressing in Place as Prescribed: Yes Patient Has Alerts: Yes Has Compression in Place as Prescribed: No Patient Alerts: Patient on Blood Pain Present Now: No Thinner aspirin 81 Electronic Signature(s) Signed: 06/01/2020 2:14:25 PM By: [name] Entered By: [name] on 06/01/2020 12:41:07 Asch, [name] ([id number]) -------------------------------------------------------------------------------- Compression Therapy Details Patient Name: [name]. Date of Service: 06/01/2020 12:30 PM Medical Record Number: [id number] Patient Account Number: [id number] Date of Birth/Sex: 12/03/1945 (74 y.o. F) Treating RN: Cornell Barman Primary Care Voyd Groft: [name] Other Clinician: Referring Raziah Funnell: [name] Treating Alicyn Klann/Extender: Tito Dine in Treatment: 77 Compression Therapy Performed for Wound Assessment:  Wound #5 Left,Medial Lower Leg Performed By: Clinician Cornell Barman, RN Compression Type: Four Layer Pre Treatment ABI: 1 Post Procedure Diagnosis Same as Pre-procedure Electronic Signature(s) Signed: 06/01/2020 4:56:00 PM By: [name], [name], RN, CWS, [name], [name] Entered By: [name], [name], RN, CWS, Kim on 06/01/2020 12:58:12 [name] ([id number]) -------------------------------------------------------------------------------- Encounter Discharge Information Details Patient Name: [name]. Date of Service: 06/01/2020 12:30 PM Medical Record Number: [id number] Patient Account Number: [id number] Date of Birth/Sex: [birth year] (74 y.o. F) Treating RN: Cornell Barman Primary Care Lesleyann Fichter: [name] Other Clinician: Referring Arbie Blankley: [name] Treating Halima Fogal/Extender: Tito Dine in Treatment: 22 Encounter Discharge Information Items Discharge Condition: Stable Ambulatory Status: Ambulatory Discharge Destination: Home Transportation: Private Auto Accompanied By: self Schedule Follow-up Appointment: Yes Clinical Summary of Care: Electronic Signature(s) Signed: 06/01/2020 4:56:00 PM By: [name], [name], RN, CWS, [name], [name] Entered By: [name], [name], RN, CWS, Kim on 06/01/2020 13:10:21 [name] ([id number]) -------------------------------------------------------------------------------- Lower Extremity Assessment Details Patient Name: [name]. Date of Service: 06/01/2020 12:30 PM Medical Record Number: [id number] Patient Account Number: [id number] Date of Birth/Sex: 14-Aug-1946 (74 y.o. F) Treating RN: Cornell Barman Primary Care Kalisa Girtman: [name] Other Clinician: Referring Ryane Konieczny: [name] Treating Roddrick Sharron/Extender: Tito Dine in Treatment: 77 Edema Assessment Assessed: [Left: Yes] [Right: No] Edema: [Left: Ye] [Right: s] Calf Left: Right: Point of Measurement: cm From Medial Instep 46 cm cm Ankle Left:  Right: Point of Measurement: cm From Medial Instep 23.5 cm cm Vascular Assessment Pulses: Dorsalis Pedis Palpable: [Left:Yes] Posterior Tibial Palpable: [Left:Yes] Electronic Signature(s) Signed: 06/01/2020 2:14:25 PM By: [name] Signed: 06/01/2020 4:56:00 PM By: [name], [name], RN, CWS, [name], [name] Entered By: [name] on 06/01/2020 12:52:41 HAYSLEE, CASEBOLT ([id number]) -------------------------------------------------------------------------------- Multi Wound Chart Details Patient Name: [name]. Date of Service: 06/01/2020 12:30 PM Medical Record Number: [id number] Patient Account Number: [id number] Date of Birth/Sex: 05/14/1946 (74 y.o. F) Treating RN: Cornell Barman Primary Care Suleika Donavan: [name] Other Clinician: Referring  Tranika Scholler: [name] Treating Cannan Beeck/Extender: Tito Dine in Treatment: 46 Vital Signs Height(in): 63 Pulse(bpm): 78 Weight(lbs): 224.7 Blood Pressure(mmHg): 156/69 Body Mass Index(BMI): 40 Temperature(F): 98.1 Respiratory Rate(breaths/min): 18 Photos: [N/A:N/A] Wound Location: Left, Medial Lower Leg N/A N/A Wounding Event: Gradually Appeared N/A N/A Primary Etiology: Lymphedema N/A N/A Comorbid History: Cataracts, Asthma, Sleep Apnea, N/A N/A Deep Vein Thrombosis, Hypertension, Peripheral Venous Disease, Osteoarthritis, Received Chemotherapy, Received Radiation Date Acquired: 11/19/2018 N/A N/A Weeks of Treatment: 77 N/A N/A Wound Status: Open N/A N/A Measurements L x W x D (cm) 1.6x0.9x0.1 N/A N/A Area (cm) : 1.131 N/A N/A Volume (cm) : 0.113 N/A N/A % Reduction in Area: 86.80% N/A N/A % Reduction in Volume: 86.80% N/A N/A Classification: Full Thickness Without Exposed N/A N/A Support Structures Exudate Amount: Medium N/A N/A Exudate Type: Serosanguineous N/A N/A Exudate Color: red, brown N/A N/A Wound Margin: Flat and Intact N/A N/A Granulation Amount: Large (67-100%) N/A N/A Granulation  Quality: Red N/A N/A Necrotic Amount: Small (1-33%) N/A N/A Exposed Structures: Fat Layer (Subcutaneous Tissue) N/A N/A Exposed: Yes Fascia: No Tendon: No Muscle: No Joint: No Bone: No Epithelialization: Small (1-33%) N/A N/A Procedures Performed: Compression Therapy N/A N/A Treatment Notes Electronic Signature(s) Signed: 06/01/2020 3:56:44 PM By: Linton Ham MD Entered By: Linton Ham on 06/01/2020 12:59:57 RAHMA, MELLER ([id number] Lango, [name] ([id number]) -------------------------------------------------------------------------------- Multi-Disciplinary Care Plan Details Patient Name: LAMAE, FOSCO. Date of Service: 06/01/2020 12:30 PM Medical Record Number: [id number] Patient Account Number: [id number] Date of Birth/Sex: 10-17-1946 (74 y.o. F) Treating RN: Cornell Barman Primary Care [place]: [name] Other Clinician: Referring Carmellia Kreisler: [name] Treating Kateria Cutrona/Extender: Tito Dine in Treatment: 1 Active Inactive Medication Nursing Diagnoses: Knowledge deficit related to medication safety: actual or potential Goals: Patient/caregiver will demonstrate understanding of new oral/IV medications prescribed at the [facility] (topical prescriptions are covered under the skin breakdown problem) Date Initiated: 12/16/2019 Target Resolution Date: 01/13/2020 Goal Status: Active Interventions: Assess for medication contraindications each visit where new medications are prescribed Treatment Activities: New medication prescribed at [place] : 12/16/2019 Notes: Soft Tissue Infection Nursing Diagnoses: Impaired tissue integrity Goals: Patient's soft tissue infection will resolve Date Initiated: 12/10/2018 Target Resolution Date: 01/09/2019 Goal Status: Active Interventions: Assess signs and symptoms of infection every visit Notes: Venous Leg Ulcer Nursing Diagnoses: Actual venous Insuffiency (use after diagnosis is  confirmed) Goals: Patient will maintain optimal edema control Date Initiated: 12/10/2018 Target Resolution Date: 01/09/2019 Goal Status: Active Interventions: Assess peripheral edema status every visit. Treatment Activities: Therapeutic compression applied : 12/10/2018 Notes: Wound/Skin Impairment Nursing Diagnoses: TERRILEE, DUDZIK ([id number]) Impaired tissue integrity Goals: Patient/caregiver will verbalize understanding of skin care regimen Date Initiated: 12/10/2018 Target Resolution Date: 01/09/2019 Goal Status: Active Interventions: Assess ulceration(s) every visit Treatment Activities: Topical wound management initiated : 12/10/2018 Notes: Electronic Signature(s) Signed: 06/01/2020 4:56:00 PM By: [name], [name], RN, CWS, [name], [name] Entered By: [name], [name], RN, CWS, Kim on 06/01/2020 12:57:12 IKRAN, PATMAN ([id number]) -------------------------------------------------------------------------------- Pain Assessment Details Patient Name: TYLYNN, BRANIFF. Date of Service: 06/01/2020 12:30 PM Medical Record Number: [id number] Patient Account Number: [id number] Date of Birth/Sex: 1946/07/19 (74 y.o. F) Treating RN: Cornell Barman Primary Care Aryani Daffern: [name] Other Clinician: Referring Ellaree Gear: [name] Treating Aideliz Garmany/Extender: Tito Dine in Treatment: 21 Active Problems Location of Pain Severity and Description of Pain Patient Has Paino No Site Locations With Dressing Change: No Pain Management and Medication Current Pain Management: Electronic Signature(s) Signed: 06/01/2020 2:14:25 PM By: [name]  Signed: 06/01/2020 4:56:00 PM By: [name], [name], RN, CWS, [name], [name] Entered By: [name] on 06/01/2020 12:41:50 [name] ([id number]) -------------------------------------------------------------------------------- Patient/Caregiver Education Details Patient Name: AMAIRANY, SCHUMPERT. Date of Service: 06/01/2020 12:30 PM Medical Record  Number: [id number] Patient Account Number: [id number] Date of Birth/Gender: Mar 25, 1946 (74 y.o. F) Treating RN: Cornell Barman Primary Care Physician: [name] Other Clinician: Referring Physician: [name] Treating Physician/Extender: Tito Dine in Treatment: 35 Education Assessment Education Provided To: Patient Education Topics Provided Venous: Handouts: Controlling Swelling with Multilayered Compression Wraps Methods: Demonstration, Explain/Verbal Responses: State content correctly Wound/Skin Impairment: Handouts: Caring for Your Ulcer Methods: Demonstration, Explain/Verbal Responses: State content correctly Electronic Signature(s) Signed: 06/01/2020 4:56:00 PM By: [name], [name], RN, CWS, [name], [name] Entered By: [name], [name], RN, CWS, Kim on 06/01/2020 13:09:19 [name] ([id number]) -------------------------------------------------------------------------------- Wound Assessment Details Patient Name: TORRYN, FISKE. Date of Service: 06/01/2020 12:30 PM Medical Record Number: [id number] Patient Account Number: [id number] Date of Birth/Sex: 1946/03/25 (74 y.o. F) Treating RN: Cornell Barman Primary Care Cashawn Yanko: [name] Other Clinician: Referring Deondrick Searls: [name] Treating Haroon Shatto/Extender: Tito Dine in Treatment: 27 Wound Status Wound Number: 5 Primary Lymphedema Etiology: Wound Location: Left, Medial Lower Leg Wound Open Wounding Event: Gradually Appeared Status: Date Acquired: 11/19/2018 Comorbid Cataracts, Asthma, Sleep Apnea, Deep Vein Thrombosis, Weeks Of Treatment: 77 History: Hypertension, Peripheral Venous Disease, Osteoarthritis, Clustered Wound: No Received Chemotherapy, Received Radiation Photos Wound Measurements Length: (cm) 1.6 Width: (cm) 0.9 Depth: (cm) 0.1 Area: (cm) 1.131 Volume: (cm) 0.113 % Reduction in Area: 86.8% % Reduction in Volume: 86.8% Epithelialization: Small (1-33%) Wound  Description Classification: Full Thickness Without Exposed Support Structures Wound Margin: Flat and Intact Exudate Amount: Medium Exudate Type: Serosanguineous Exudate Color: red, brown Foul Odor After Cleansing: No Slough/Fibrino Yes Wound Bed Granulation Amount: Large (67-100%) Exposed Structure Granulation Quality: Red Fascia Exposed: No Necrotic Amount: Small (1-33%) Fat Layer (Subcutaneous Tissue) Exposed: Yes Necrotic Quality: Adherent Slough Tendon Exposed: No Muscle Exposed: No Joint Exposed: No Bone Exposed: No Treatment Notes Wound #5 (Left, Medial Lower Leg) Notes Prisma Ag, hydrogel, ABD, 4 layer unna to anchor Electronic Signature(s) SYLENA, LOTTER ([id number]) Signed: 06/01/2020 2:14:25 PM By: [name] Signed: 06/01/2020 4:56:00 PM By: [name], [name], RN, CWS, [name], [name] Entered By: [name] on 06/01/2020 12:48:28 LAYANI, FORONDA ([id number]) -------------------------------------------------------------------------------- Vitals Details Patient Name: FANNIE, GATHRIGHT. Date of Service: 06/01/2020 12:30 PM Medical Record Number: [id number] Patient Account Number: [id number] Date of Birth/Sex: [birth year] (74 y.o. F) Treating RN: Cornell Barman Primary Care [place]: [name] Other Clinician: Referring Huong Luthi: [name] Treating Josh Nicolosi/Extender: Tito Dine in Treatment: 77 Vital Signs Time Taken: 12:35 Temperature (F): 98.1 Height (in): 63 Pulse (bpm): 78 Weight (lbs): 224.7 Respiratory Rate (breaths/min): 18 Body Mass Index (BMI): 39.8 Blood Pressure (mmHg): 156/69 Reference Range: 80 - 120 mg / dl Electronic Signature(s) Signed: 06/01/2020 2:14:25 PM By: [name] Entered By: [name] on 06/01/2020 12:41:39

## 2020-06-01 NOTE — Progress Notes (Signed)
[name], [name] ([id number]) Visit Report for 06/01/2020 HPI Details Patient Name: [name], [name]. Date of Service: 06/01/2020 12:30 PM Medical Record Number: [id number] Patient Account Number: [id number] Date of Birth/Sex: [birth year] (74 y.o. F) Treating RN: Cornell Barman Primary Care Provider: [name] Other Clinician: Referring Provider: [name] Treating Provider/Extender: Tito Dine in Treatment: 45 History of Present Illness HPI Description: Pleasant 74 year old with history of chronic venous insufficiency. No diabetes or peripheral vascular disease. Left ABI 1.29. Questionable history of left lower extremity DVT. She developed a recurrent ulceration on her left lateral calf in December 2015, which she attributes to poor diet and subsequent lower extremity edema. She underwent endovenous laser ablation of her left greater saphenous vein in 2010. She underwent laser ablation of accessory branch of left GSV in April 2016 by Dr. Kellie Simmering at [facility]. She was previously wearing Unna boots, which she tolerated well. Tolerating 2 layer compression and cadexomer iodine. She returns to clinic for follow-up and is without new complaints. She denies any significant pain at this time. She reports persistent pain with pressure. No claudication or ischemic rest pain. No fever or chills. No drainage. READMISSION 11/13/16; this is a 74 year old woman who is not a diabetic. She is here for a review of a painful area on her left medial lower extremity. I note that she was seen here previously last year for wound I believe to be in the same area. At that time she had undergone previously a left greater saphenous vein ablation by Dr. Kellie Simmering and she had a ablation of the anterior accessory branch of the left greater saphenous vein in March 2016. Seeing that the wound actually closed over. In reviewing the history with her today the ulcer in this area has been recurrent. She describes  a biopsy of this area in 2009 that only showed stasis physiology. 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 Do not see that she has ever had arterial studies in the left leg. She tells me she has a follow-up with Dr. Kellie Simmering in roughly 10 days In any case she developed the reopening of this area roughly a month ago. On the background of this she describes rapidly increasing edema which has responded to Lasix 40 mg and metolazone 2.5 mg as well as the patient's lymph massage. She has been told she has both venous insufficiency and lymphedema but she cannot tolerate compression stockings 11/28/16; the patient saw Dr. Kellie Simmering recently. Per the patient he did arterial Dopplers in the office that did not show evidence of arterial insufficiency, per the patient he stated "treat this like an ordinary venous ulcer". She also saw her dermatologist Dr. Ronnald Ramp who felt that this was more of a vascular ulcer. In general things are improving although she arrives today with increasing bilateral lower extremity edema with weeping a deeper fluid through the wound on the left medial leg compatible with some degree of lymphedema 12/04/16; the patient's wound is fully epithelialized but I don't think fully healed. We will do another week of depression with Promogran and TCA however I suspect we'll be able to discharge her next week. This is a very unusual-looking wound which was initially a figure-of-eight type wound lying on its side surrounded by petechial like hemorrhage. She  has had venous ablation on this side. She apparently does not have an arterial issue per Dr. Kellie Simmering. She saw her dermatologist thought it was "vascular". Patient is definitely going to need ongoing compression and I talked about  this with her today she will go to elastic therapy after she leaves here next week 12/11/16; the patient's wound is not completely closed today. She has surrounding scar tissue and in further discussion with the patient it would appear that she had ulcers in this area in 2009 for a prolonged period of time ultimately requiring a punch biopsy of this area that only showed venous insufficiency. I did not previously pickup on this part of the history from the patient. 12/18/16; the patient's wound is completely epithelialized. There is no open area here. She has significant bilateral venous insufficiency with secondary lymphedema to a mild-to-moderate degree she does not have compression stockings.. She did not say anything to me when I was in the room, she told our intake nurse that she was still having pain in this area. This isn't unusual recurrent small open area. She is going to go to elastic therapy to obtain compression stockings. 12/25/16; the patient's wound is fully epithelialized. There is no open area here. The patient describes some continued episodic discomfort in this area medial left calf. However everything looks fine and healed here. She is been to elastic therapy and caught herself 15-20 mmHg stockings, they apparently were having trouble getting 20-30 mm stockings in her size 01/22/17; this is a 74 year old we discharged from the clinic a month ago. She has a recurrent open wound on her medial left calf. She had 15 mm support stockings. I told her I thought she needed 20-30 mm compression stockings. She tells me that she has been ill with hospitalization secondary to asthma and is been found to have severe hypokalemia likely secondary to a combination of Lasix and metolazone. This morning she noted blistering and leaking fluid on the posterior part of her left leg. She called our intake nurse urgently and we was saw her this afternoon. She has not had any real discomfort here. I don't know  that she's been wearing any stockings on this leg for at least 2-3 days. ABIs in this clinic were 1.21 on the right and 1.3 on the left. She is previously seen vascular surgery who does not think that there is a peripheral arterial issue. 01/30/17; Patient arrives with no open wound on the left leg. She has been to elastic therapy and obtained 20-14mmhg below knee stockings and she has one on the right leg today. READMISSION 02/19/18; this Cirrincione is a now 74 year old patient we've had in this clinic perhaps 3 times before. I had last looked at her from January 07 December 2016 with an area on the medial left leg. We discharged her on 12/25/16 however she had to be readmitted on 01/22/17 with a recurrence. I have in my notes that we discharged her on 20-30 mm stockings although she tells me she was only wearing support hose because she cannot get stockings on predominantly related to her cervical spine surgery/issues. She has had previous ablations done by vein and vascular in [place] including a great saphenous vein ablation on the left with an anterior accessory branch ablation I think both of these were in 2016. On one of the previous visit she had a biopsy noted 2009 that was negative. She is not felt to have an arterial issue. She is not a diabetic. She does  have a history of obstructive sleep apnea hypertension asthma as well as chronic venous insufficiency and lymphedema. On this occasion she noted 2 dry scaly patch on her left leg. She tried to put lotion on this it didn't really help. There were 2 open areas.the JAMERIA, BRADWAY ([id number]) patient has been seeing her primary physician from 02/05/18 through 02/14/18. She had Unna boots applied. The superior wound now on the lateral left leg has closed but she's had one wound that remains open on the lateral left leg. This is not the same spot as we dealt with in 2018. ABIs in this clinic were 1.3 bilaterally 02/26/18; patient has a small wound on  the left lateral calf. Dimensions are down. She has chronic venous insufficiency and lymphedema. 03/05/18; small open area on the left lateral calf. Dimensions are down. Tightly adherent necrotic debris over the surface of the wound which was difficult to remove. Also the dressing [over collagen] stuck to the wound surface. This was removed with some difficulty as well. Change the primary dressing to Hydrofera Blue ready 03/12/18; small open area on the left lateral calf. Comes in with tightly adherent surface eschar as well as some adherent Hydrofera Blue. 03/19/18; open area on the left lateral calf. Again adherent surface eschar as well as some adherent Hydrofera Blue nonviable subcutaneous tissue. She complained of pain all week even with the reduction from 4-3 layer compression I put on last week. Also she had an increase in her ankle and calf measurements probably related to the same thing. 03/26/18; open area on the left lateral calf. A very small open area remains here. We used silver alginate starting last week as the Hydrofera Blue seem to stick to the wound bed. In using 4-layer compression 04/02/18; the open area in the left lateral calf at some adherent slough which I removed there is no open area here. We are able to transition her into her own compression stocking. Truthfully I think this is probably his support hose. However this does not maintain skin integrity will be limited. She cannot put over the toe compression stockings on because of neck problems hand problems etc. She is allergic to the lining layer of juxta lites. We might be forced to use extremitease stocking should this fail READMIT 11/24/2018 Patient is now a 74 year old woman who is not a diabetic. She has been in this clinic on at least 3 previous occasions largely with recurrent wounds on her left leg secondary to chronic venous insufficiency with secondary lymphedema. Her situation is complicated by inability to  get stockings on and an allergy to neoprene which is apparently a component and at least juxta lites and other stockings. As a result she really has not been wearing any stockings on her legs. She tells [place] that roughly 2 or 3 weeks ago she started noticing a stinging sensation just above her ankle on the left medial aspect. She has been diagnosed with pseudogout and she wondered whether this was what she was experiencing. She tried to dress this with something she bought at the store however subsequently it pulled skin off and now she has an open wound that is not improving. She has been using Vaseline gauze with a cover bandage. She saw her primary doctor last week who put an Haematologist on her. ABIs in this clinic was 1.03 on the left 2/12; the area is on the left medial ankle. Odd-looking wound with what looks to be surface epithelialization but a multitude of  small petechial openings. This clearly not closed yet. We have been using silver alginate under 3 layer compression with TCA 2/19; the wound area did not look quite as good this week. Necrotic debris over the majority of the wound surface which required debridement. She continues to have a multitude of what looked to be small petechial openings. She reminds [place] that she had a biopsy on this initially during her first outbreak in 2015 in [place] [facility]. She expresses concern about this being a possible melanoma. 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 I assured her that this does not look anything like melanoma. Except for the petechial reaction it does look like a venous insufficiency area and she certainly has evidence of this on both sides 2/26; a difficult area on the left medial ankle. The patient clearly has chronic venous hypertension with some degree of lymphedema. The odd thing about the area is the small petechial hemorrhages. I am not  really sure how to explain this. This was present last time and this is not a compression injury. We have been using Hydrofera Blue which I changed to last week 3/4; still using Hydrofera Blue. Aggressive debridement today. She does not have known arterial issues. She has seen Dr. Kellie Simmering at [facility] vein [facility] vascular and and has an ablation on the left. [Anterior accessory branch of the greater saphenous]. From what I remember they did not feel she had an arterial issue. The patient has had this area biopsied in 2009 at [facility] dermatology and by her recollection they said this was "stasis". She is also follow-up with dermatology locally who thought that this was more of a vascular issue 3/11; using Hydrofera Blue. Aggressive debridement today. She does not have an arterial issue. We are using 3 layer compression although we may need to go to 4. The patient has been in for multiple changes to her wrap since I last saw her a week ago. She says that the area was leaking. I do not have too much more information on what was found 01/19/19 on evaluation today patient was actually being seen for a nurse visit when unfortunately she had the area on her left lateral lower extremity as well as weeping from the right lower extremity that became apparent. Therefore we did end up actually seeing her for a full visit with myself. She is having some pain at this site as well but fortunately nothing too significant at this point. No fevers, chills, nausea, or vomiting noted at this time. 3/18-Patient is back to the clinic with the left leg venous leg ulcer, the ulcer is larger in size, has a surface that is densely adherent with fibrinous tissue, the Hydrofera Blue was used but is densely adherent and there was difficulty in removing it. The right lower extremity was also wrapped for weeping edema. Patient has a new area over the left lateral foot above the malleolus that is small and appears to have no debris  with intact surrounding skin. Patient is on increased dose of Lasix also as a means to edema management 3/25; the patient has a nonhealing venous ulcer on the medial left leg and last week developed a smaller area on the lateral left calf. We have been using Hydrofera Blue with a contact layer. 4/1; no major change in these wounds areas. Left medial and more recently left lateral calf. I tried Iodoflex last week to aid in debridement  she did not tolerate this. She stated her pain was terrible all week. She took the top layer of the 4 layer compression off. 4/8; the patient actually looks somewhat better in terms of her more prominent left lateral calf wound. There is some healthy looking tissue here. She is still complaining of a lot of discomfort. 4/15; patient in a lot of pain secondary to sciatica. She is on a prednisone taper prescribed by her primary physician. She has the 2 areas one on the left medial and more recently a smaller area on the left lateral calf. Both of these just above the malleoli 4/22; her back pain is better but she still states she is very uncomfortable and now feels she is intolerant to the [employer]. No real change in the wounds we have been using Sorbact. She has been previously intolerant to Iodoflex. There is not a lot of option about what we can use to debride this wound under compression that she no doubt needs. sHe states Ultram no longer works for her pain 4/29; no major change in the wounds slightly increased depth. Surface on the original medial wound perhaps somewhat improved however the more recent area on the lateral left ankle is 100% covered in very adherent debris we have been using Sorbact. She tolerates 4 layer compression well and her edema control is a lot better. She has not had to come in for a nurse check 5/6; no major change in the condition of the wounds. She did consent to debridement today which was done with some difficulty. Continuing Sorbact.  She did not tolerate Iodoflex. She was in for a check of her compression the day after we wrapped her last week this was adjusted but nothing much was found 5/13; no major change in the condition or area of the wounds. I was able to get a fairly aggressive debridement done on the lateral left leg wound. Even using Sorbact under compression. She came back in on Friday to have the wrap changed. She says she felt uncomfortable on the [name], [name]. ([id number]) lateral aspect of her ankle. She has a long history of chronic venous insufficiency including previous ablation surgery on this side. 5/20-Patient returns for wounds on left leg with both wounds covered in slough, with the lateral leg wound larger in size, she has been in 3 layer compression and felt more comfortable, she describes pain in ankle, in leg and pins and needles in foot, and is about to try Pamelor for this 6/3; wounds on the left lateral and left medial leg. The area medially which is the most recent of the 2 seems to have had the largest increase in dimensions. We have been using Sorbac to try and debride the surface. She has been to see orthopedics they apparently did a plain x-ray that was indeterminant. Diagnosed her with neuropathy and they have ordered an MRI to determine if there is underlying osteomyelitis. This was not high on my thought list but I suppose it is prudent. We have advised her to make an appointment with vein and vascular in [place]. She has a history of a left greater saphenous and accessory vein ablations I wonder if there is anything else that can be done from a surgical point of view to help in these difficult refractory wounds. We have previously healed this wound on one occasion but it keeps on reopening [medial side] 6/10; deep tissue culture I did last week I think on the left medial wound showed  both moderate E. coli and moderate staph aureus [MSSA]. She is going to require antibiotics and I have  chosen Augmentin. We have been using Sorbact and we have made better looking wound surface on both sides but certainly no improvement in wound area. She was back in last Friday apparently for a dressing changes the wrap was hurting her outer left ankle. She has not managed to get a hold of vein and vascular in [place]. We are going to have to make her that appointment 6/17; patient is tolerating the Augmentin. She had an MRI that I think was ordered by orthopedic surgeon this did not show osteomyelitis or an abscess did suggest cellulitis. We have been using Sorbact to the lateral and medial ankles. We have been trying to arrange a follow-up appointment with vein and vascular in [place] or did her original ablations. We apparently an area sent the request to vein and vascular in [facility] 6/24; patient has completed the Augmentin. We do not yet have a vein and vascular appointment in [place]. I am not sure what the issue is here we have asked her to call tomorrow. We are using Sorbact. Making some improvements and especially the medial wound. Both surfaces however look better medial and lateral. 7/1; the patient has been in contact with vein and vascular in [place] but has not yet received an appointment. Using Sorbact we have gradually improve the wound surface with no improvement in surface area. She is approved for Apligraf but the wound surface still is not completely viable. She has not had to come in for a dressing change 7/8; the patient has an appointment with vein and vascular on 7/31 which is a Friday afternoon. She is concerned about getting back here for [place] to dress her wounds. I think it is important to have them goal for her venous reflux/history of ablations etc. to see if anything else can be done. She apparently tested positive for 1 of the blood tests with regards to lupus and saw a rheumatologist. He has raised the issue of vasculitis again. I have had this thought in  the past however the evidence seems overwhelming that this is a venous reflux etiology. If the rheumatologist tells me there is clinical and laboratory investigation is positive for lupus I will rethink this. 7/15; the patient's wound surfaces are quite a bit better. The medial area which was her original wound now has no depth although the lateral wound which was the more recent area actually appears larger. Both with viable surfaces which is indeed better. Using Sorbact. I wanted to use Apligraf on her however there is the issue of the vein and vascular appointment on 7/31 at 2:00 in the afternoon which would not allow her to get back to be rewrapped and they would no doubt remove the graft 7/22; the patient's wound surfaces have moderate amount of debris although generally look better. The lateral one is larger with 2 small satellite areas superiorly. We are waiting for her vein and vascular appointment on 7/31. She has been approved for Apligraf which I would like to use after th 7/29; wound surfaces have improved no debridement is required we have been using Sorbact. She sees vein and vascular on Friday with this so question of whether anything can be done to lessen the likelihood of recurrence and/or speed the healing of these areas. She is already had previous ablations. She no doubt has severe venous hypertension 8/5-Patient returns at 1 week, she was in [employer]  for 3 days by her podiatrist, we have been using so backed to the wound, she has increased pain in both the wounds on the left lower leg especially the more distal one on the lateral aspect 8/12-Patient returns at 1 week and she is agreeable to having debridement in both wounds on her left leg today. We have been using Sorbact, and vascular studies were reviewed at last visit 8/19; the patient arrives with her wounds fairly clean and no debridement is required. We have used Sorbact which is really done a nice job in cleaning up  these very difficult wound surfaces. The patient saw Dr. Donzetta Matters of vascular surgery on 7/31. He did not feel that there was an arterial component. He felt that her treated greater saphenous vein is adequately addressed and that the small saphenous vein did not appear to be involved significantly. She was also noted to have deep venous reflux which is not treatable. Dr. Donzetta Matters mentioned the possibility of a central obstructive component leading to reflux and he offered her central venography. She wanted to discuss this or think about it. I have urged her to go ahead with this. She has had recurrent difficult wounds in these areas which do heal but after months in the clinic. If there is anything that can be done to reduce the likelihood of this I think it is worth it. 9/2 she is still working towards getting follow-up with Dr. Donzetta Matters to schedule her CT. Things are quite a bit worse venography. I put Apligraf on 2 weeks ago on both wounds on the medial and lateral part of her left lower leg. She arrives in clinic today with 3 superficial additional wounds above the area laterally and one below the wound medially. She describes a lot of discomfort. I think these are probably wrapped injuries. Does not look like she has cellulitis. 07/20/2019 on evaluation today patient appears to be doing somewhat poorly in regard to her lower extremity ulcers. She in fact showed signs of erythema in fact we may even be dealing with an infection at this time. Unfortunately I am unsure if this is just infection or if indeed there may be some allergic reaction that occurred as a result of the Apligraf application. With that being said that would be unusual but nonetheless not impossible in this patient is one who is unfortunately allergic to quite a bit. Currently we have been using the Sorbact which seems to do as well as anything for her. I do think we may want to obtain a culture today to see if there is anything showing up  there that may need to be addressed. 9/16; noted that last week the wounds look worse in 1 week follow-up of the Apligraf. Using Sorbact as of 2 days ago. She arrives with copious amounts of drainage and new skin breakdown on the back of the left calf. The wounds arm more substantial bilaterally. There is a fair amount of swelling in the left calf no overt DVT there is edema present I think in the left greater than right thigh. She is supposed to go on 9/28 for CT venography. The wounds on the medial and lateral calf are worse and she has new skin breakdown posteriorly at least new for me. This is almost developing into a circumferential wound area The Apligraf was taken off last week which I agree with things are not going in the right direction a culture was done we do not have that back yet. She is  on Augmentin that she started 2 days ago 9/23; dressing was changed by her nurses on Monday. In general there is no improvement in the wound areas although the area looks less angry than last week. She did get Augmentin for MSSA cultured on the 14th. She still appears to have too much swelling in the left leg even with 3 layer compression 9/30; the patient underwent her procedure on 9/28 by Dr. Donzetta [facility]ters at [facility]. She was discovered to have the common iliac vein measuring 12.2 mm but at the level of L4-L5 measured 3 mm. After stenting it measured 10 mm. 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 Rouleaux flow in the common femoral and femoral vein was observed much improved after stenting. We are using silver alginate to the wounds on the medial and lateral ankle on the left. 4 layer compression 10/7; the patient had fluid swelling around her knee and 4 layer compression. At the advice of vein and vascular this was reduced to 3 layer which she is tolerating better. We have been using silver alginate under 3 layer compression since last Friday 10/14; arrives with the  areas on the left ankle looking a lot better. Inflammation in the area also a lot better. She came in for a nurse check on [name], [name] ([id number]) 10/9 10/21; continued nice improvement. Slight improvements in surface area of both the medial and lateral wounds on the left. A lot of the satellite lesions in the weeping erythema around these from stasis dermatitis is resolved. We have been using silver alginate 10/28; general improvement in the entire wound areas although not a lot of change in dimensions the wound certainly looks better. There is a lot less in terms of venous inflammation. Continue silver alginate this week however look towards Hydrofera Blue next week 11/4; very adherent debris on the medial wound left wound is not as bad. We have been using silver alginate. Change to [facility] today 11/11; very adherent debris on both wound areas. She went to vein and vascular last week and follow-up they put in [place] boot on this today. He says the [facility] was adherent. Wound is definitely not as good as last week. Especially on the left there the satellite lesions look more prominent 11/18; absolutely no better. erythema on lateral aspect with tenderness. 09/30/2019 on evaluation today patient appears to actually be doing better. Dr. Dellia Nims did put her on doxycycline last week which I do believe has helped her at this point. Fortunately there is no signs of active infection at this time. No fevers, chills, nausea, vomiting, or diarrhea. I do believe he may want extend the doxycycline for 7 additional days just to ensure everything does completely cleared up the patient is in agreement with that plan. Otherwise she is going require some sharp debridement today 12/2; patient is completing a 2-week course of doxycycline. I gave her this empirically for inflammation as well as infection when I last saw her 2 weeks ago. All of this seems to be better. She is using silver alginate she  has the area on the medial aspect of the larger area laterally and the 2 small satellite regions laterally above the major wound. 12/9; the patient's wound on the left medial and left lateral calf look really quite good. We have been using silver alginate. She saw vein and vascular in follow-up on 10/09/2019. She has had a previous left greater saphenous vein ablation by Dr. Oscar La in 2016.  More recently she underwent a left common iliac vein stent by Dr. 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 The swelling is improved and certainly the wounds have improved. The patient shows [place] today area on the right medial calf there is almost no wound but leaking lymphedema. She says she start this started 3 or 4 days ago. She did not traumatize it. It is not painful. She does not wear compression on that side 12/16; the patient continues to do well laterally. Medially still requiring debridement. The area on the right calf did not materialize to anything and is not currently open. We wrapped this last time. She has support stockings for that leg although I am not sure they are going to provide adequate compression 12/23; the lateral wound looks stable. Medially still requiring debridement for tightly adherent fibrinous debris. We've been using silver alginate. Surface area not any different 12/30; neither wound is any better with regards to surface and the area on the left lateral is larger. I been using silver alginate to the left lateral which look quite good last week and Sorbact to the left medial 11/11/2019. Lateral wound area actually looks better and somewhat smaller. Medial still requires a very aggressive debridement today. We have been using Sorbact on both wound areas 1/13; not much better still adherent debris bilaterally. I been using Sorbact. She has severe venous hypertension. Probably some degree of dermal fibrosis distally. I wonder whether tighter compression might help and I am going  to try that today. We also need to work on the bioburden 1/20; using Sorbact. She has severe venous hypertension status post stent placement for pelvic vein compression. We applied gentamicin last time to see if we could reduce bioburden I had some discussion with her today about the use of pentoxifylline. This is occasionally used in this setting for wounds with refractory venous insufficiency. However this interacts with Plavix. She tells me that she was put on this after stent placement for 3 months. She will call Dr. Claretha Cooper office to discuss 1/27; we are using gentamicin under Sorbact. 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 She has a stent. Wound medially is measuring smaller this week. Laterally measuring slightly larger although she has some satellite lesions superiorly 2/3; gentamicin under Sorbact under 4-layer compression. 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 She has a stent on Plavix. Her wounds are measuring smaller this week. More substantially laterally where there is a satellite lesion superiorly. 2/10; gentamicin under Sorbac. 4-layer compression. Patient communicated with Dr. Donzetta Matters at vein and vascular in [place]. He is okay with the patient coming off Plavix I will therefore start her on pentoxifylline for a 1 month trial. In general her wounds look better today. I had some concerns about swelling in the left thigh however she measures 61.5 on the right and 63 on the mid thigh which does not suggest there is any difficulty. The patient is not describing any pain. 2/17; gentamicin under Sorbac 4-layer compression. She has been on pentoxifylline for 1 week and complains of loose stool. No nausea she is eating and drinking well 2/24; the patient apparently came in 2 days ago for a nurse visit when her wrap fell down. Both areas look a little worse this week macerated medially and satellite lesions laterally. Change to  silver alginate today 3/3; wounds are larger today especially medially. She also has more swelling in her foot lower leg and I even noted some swelling in  her posterior thigh which is tender. I wonder about the patency of her stent. Fortuitously she sees Dr. Claretha Cooper group on Friday 3/10; Mrs. Mastrogiovanni was seen by vein and vascular on 3/5. The patient underwent ultrasound. There was no evidence of thrombosis involving the IVC no evidence of thrombosis involving the right common iliac vein there is no evidence of thrombosis involving the right external iliac vein the left external vein is also patent. The right common iliac vein stent appears patent bilateral common femoral veins are compressible and appear patent. I was concerned about the left common iliac stent however it looks like this is functional. She has some edema in the posterior thigh that was tender she still has that this week. I also note they had trouble finding the pulses in her left foot and booked her for an ABI baseline in 4 weeks. She will follow up in 6 months for repeat IVC duplex. The patient stopped the pentoxifylline because of diarrhea. It does not look like that was being effective in any case. I have advised her to go back on her aspirin 81 mg tablet, vascular it also suggested this 3/17; comes in today with her wound surfaces a lot better. The excoriations from last week considerably better probably secondary to the TCA. We have been using silver alginate 3/24; comes in today with smaller wounds both medially and laterally. Both required debridement. There are 2 small satellite areas superiorly laterally. She also has a very odd bandlike area in the mid calf almost looking like there was a weakness in the wrap in a localized area. I would write this off as being this however anteriorly she has a small raised ballotable area that is very tender almost reminiscent of an abscess but there was no obvious purulent surface to  it. 02/04/20 upon evaluation today patient appears to be doing fairly well in regard to her wounds today. Fortunately there is no signs of active DEONE, OMAHONEY. ([id number]) infection at this time. No fevers, chills, nausea, vomiting, or diarrhea. She has been tolerating the dressing changes without complication. Fortunately I feel like she is showing signs of improvement although has been sometime since have seen her. Nonetheless the area of concern that Dr. Dellia Nims had last week where she had possibly an area of the wrap that was we can allow the leg to bulge appears to be doing significantly better today there is no signs of anything worsening. 4/7; the patient's wounds on her medial and lateral left leg continue to contract. We have been using a regular alginate. Last week she developed an area on the right medial lower leg which is probably a venous ulcer as well. 4/14; the wounds on her left medial and lateral lower leg continue to contract. Surface eschar. We have been using regular alginate. The area on the right medial lower leg is closed. We have been putting both legs under 4-layer contraction. The patient went back to see vein and vascular she had arterial studies done which were apparently "quite good" per the patient although I have not read their notes I have never felt she had an arterial issue. 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 This is been longstanding and refractory to exercise, leg elevation and longstanding use of compression wraps in our clinic as well as compression stockings on the times we have been able to get these to heal 4/21; we thought she actually might be close this week however she arrives in  clinic with a lot of edema in her upper left calf and into her posterior thigh. This is been an intermittent problem here. She says the wrap fell down but it was replaced with a nurse visit on Monday. We are using calcium  alginate to the wounds and the wound sizes there not terribly larger than last week but there is a lot more edema 4/28; again wound edges are smaller on both sides. Her edema is better controlled than last time. She is obtained her compression pumps from [employer] although they have not been to her home to set these up. 5/5; left medial and left lateral both look stable. I am not sure the medial is any smaller. We have been using calcium alginate under 4-layer compression. oShe had an area on the right medial. This was eschared today. We have been wrapping this as well. She does not tolerate external compression stockings due to a history of various contact allergies. She has her compression pumps however the representative from the company is coming on her to show her how to use these tomorrow 5/19; patient with severe chronic venous insufficiency secondary to central venous disease. She had a stent placed in her left common iliac vein. She has done better since but still difficult to control wounds. She comes in today with nothing open on the right leg. Her areas on the left medial and left lateral are just about closed. We are using calcium alginate under 4-layer compression. She is using her external compression pumps at home She only has 15-20 support stockings. States she cannot get anything tighter than that on. 03/30/20-Patient returns at 1 week, the wounds on the left leg are both slightly bigger, the last week she was on 3 layer compression which started to slide down. She is starting to use her lymphedema pumps although she stated on 1 day her right ankle started to swell up and she have to stop that day. Unfortunately the open area seem to oscillate between improving to the point of healing and then flaring up all to do with effectiveness of compression or lack of due to the left leg topography not keeping the compression wraps from rolling down 6/2 patient comes in with a 15/20  mmHg stocking on the right leg. She tells me that she developed a lot of swelling in her ankles she saw orthopedics she was felt to possibly be having a flare of pseudogout versus some other type of arthritis. She was put on steroids for a respiratory issue so that helps with the inflammation. She has not been using the pumps all week. She thinks the left thigh is more swollen than usual and I would agree with that. She has an appointment with Dr. Donzetta Matters 9 days or so from now 6/9; both wounds on the left medial and left lateral are smaller. We have been using calcium alginate under compression. She does not have an open wound on the right leg she is using a stocking and her compression pumps things are going well. She has an appointment with Dr. Donzetta Matters with regards to her stent in the left common iliac vein 6/16; the wounds on the left medial and left lateral ankle continues to contract. The patient saw Dr. Donzetta Matters and I think he seems satisfied. Ordered follow-up venous reflux studies on both sides in September. Cautioned that she may need thigh-high stockings. She has been using calcium alginate under compression on the left and her own stocking on the right leg.  She tells [place] there are no open wounds on the right 6/23; left lateral is just about closed. Medial required debridement today. We have been using calcium alginate. Extensive discussion about the compression pumps she is only using these on 25 mmHg states she could not take 40 or 30 when the wrap came out to her home to demonstrate these. He said they should not feel tight 6/30; the left lateral wound has a slight amount of eschar. . The area medially is about the same using Hydrofera Blue. 7/7; left lateral wound still has some eschar. I will remove this next week may be closed. The area medially is very small using Hydrofera Blue with improvement. Unfortunately the stockings fell down. Unfortunately the blisters have developed at the edge of  where the wrap fell. When this happened she says her legs hurt she did not use her pumps. We are not open Monday for her to come in and change the wraps and she had an appointment yesterday. She also tells me that she is going to have an MRI of her back. She is having pain radiating into her left anterior leg she thinks her from an L5 disc. She saw Dr. Ellene Route of neurosurgery 7/14; the area on the left lateral ankle area is closed. Still a small area medially however it looks better as well. We have been using Hydrofera Blue under 4-layer compression 7/21; left lateral ankle is still closed however her wound on the medial left calf is actually larger. This is probably because Hydrofera Blue got stuck to the wound. She came in for a nurse change on Friday and will do that again this week I was concerned about the amount of swelling that she had last week however she is using her compression pumps twice a day and the swelling seems well controlled 7/28; remaining wound on the left medial lower leg is smaller. We have been using moistened silver collagen under compression she is coming back for a nurse visit. For reasons that were not really clear she was just keeping her legs elevated and not using her compression pumps. I have asked her to use the compression pumps. She does not have any wounds on the right leg Electronic Signature(s) Signed: 06/01/2020 3:56:44 PM By: Linton Ham MD Entered By: Linton Ham on 06/01/2020 13:01:04 MAEBY, VANKLEECK ([id number]) -------------------------------------------------------------------------------- Physical Exam Details Patient Name: [name], [name]. Date of Service: 06/01/2020 12:30 PM Medical Record Number: [id number] Patient Account Number: [id number] Date of Birth/Sex: 1946-10-20 (74 y.o. F) Treating RN: Cornell Barman Primary Care Provider: [name] Other Clinician: Referring Provider: [name] Treating Provider/Extender: Tito Dine in Treatment: 17 Constitutional Patient is hypertensive.. Pulse regular and within target range for patient.Marland Kitchen Respirations regular, non-labored and within target range.. Temperature is normal and within the target range for the patient.Marland Kitchen appears in no distress. Cardiovascular Pedal pulses are pedal pulses. She has good to just below the left knee. Then it expands there is also nonpitting edema up into her thighs. Integumentary (Hair, Skin) No erythema no erythema around the wound. Notes Wound exam; the areas on the left medial lower leg just above the ankle. The surface looks healthy no debridement is required. Surface area measurements are better there is no evidence of surrounding infection. Electronic Signature(s) Signed: 06/01/2020 3:56:44 PM By: Linton Ham MD Entered By: Linton Ham on 06/01/2020 13:05:27 [name] ([id number]) -------------------------------------------------------------------------------- Physician Orders Details Patient Name: [name], [name]. Date of Service: 06/01/2020 12:30 PM Medical  Record Number: [id number] Patient Account Number: [id number] Date of Birth/Sex: 1946-11-04 (74 y.o. F) Treating RN: Cornell Barman Primary Care Provider: [name] Other Clinician: Referring Provider: [name] Treating Provider/Extender: Tito Dine in Treatment: 83 Verbal / Phone Orders: No Diagnosis Coding Anesthetic (add to Medication List) Wound #5 Left,Medial Lower Leg o Topical Lidocaine 4% cream applied to wound bed prior to debridement (In Clinic Only). Primary Wound Dressing Wound #5 Left,Medial Lower Leg o Silver Collagen - moistened with hydrogel Secondary Dressing Wound #5 Left,Medial Lower Leg o ABD pad Dressing Change Frequency Wound #5 Left,Medial Lower Leg o Change dressing every week o Other: - Nurse visit Friday and Monday if needed. Follow-up Appointments Wound #5 Left,Medial Lower  Leg o Return Appointment in 1 week. o Nurse Visit as needed Edema Control Wound #5 Left,Medial Lower Leg o 4-Layer Compression System - Left Lower Extremity. o Patient to wear own compression stockings - On Right o Compression Pump: Use compression pump on left lower extremity for 60 minutes, twice daily. o Compression Pump: Use compression pump on right lower extremity for 60 minutes, twice daily. Electronic Signature(s) Signed: 06/01/2020 3:56:44 PM By: Linton Ham MD Signed: 06/01/2020 4:56:00 PM By: [name], [name], RN, CWS, [name], [name] Entered By: [name], [name], RN, CWS, Kim on 06/01/2020 12:58:44 [name], [name] ([id number]) -------------------------------------------------------------------------------- Problem List Details Patient Name: [name], [name]. Date of Service: 06/01/2020 12:30 PM Medical Record Number: [id number] Patient Account Number: [id number] Date of Birth/Sex: 10/19/1946 (74 y.o. F) Treating RN: Cornell Barman Primary Care Provider: [name] Other Clinician: Referring Provider: [name] Treating Provider/Extender: Tito Dine in Treatment: 77 Active Problems ICD-10 Encounter Code Description Active Date MDM Diagnosis L97.221 Non-pressure chronic ulcer of left calf limited to breakdown of skin 01/07/2019 No Yes I89.0 Lymphedema, not elsewhere classified 12/10/2018 No Yes I87.332 Chronic venous hypertension (idiopathic) with ulcer and inflammation of 12/09/2019 No Yes left lower extremity I83.009 Varicose veins of unspecified lower extremity with ulcer of unspecified 04/13/2020 No Yes site I82.592 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 12/09/2019 No Yes lower extremity Inactive Problems ICD-10 Code Description Active Date Inactive Date L97.818 Non-pressure chronic ulcer of other part of right lower leg with other specified 10/14/2019 10/14/2019 severity Resolved Problems ICD-10 Code Description Active  Date Resolved Date L97.211 Non-pressure chronic ulcer of right calf limited to breakdown of skin 02/10/2020 02/10/2020 Electronic Signature(s) Signed: 06/01/2020 3:56:44 PM By: Linton Ham MD Entered By: Linton Ham on 06/01/2020 12:59:41 [name], [name] ([id number]) -------------------------------------------------------------------------------- Progress Note Details Patient Name: [name], [name]. Date of Service: 06/01/2020 12:30 PM Medical Record Number: [id number] Patient Account Number: [id number] Date of Birth/Sex: 1946-02-16 (74 y.o. F) Treating RN: Cornell Barman Primary Care Provider: [name] Other Clinician: Referring Provider: [name] Treating Provider/Extender: Tito Dine in Treatment: 62 Subjective History of Present Illness (HPI) Pleasant 74 year old with history of chronic venous insufficiency. No diabetes or peripheral vascular disease. Left ABI 1.29. Questionable history of left lower extremity DVT. She developed a recurrent ulceration on her left lateral calf in December 2015, which she attributes to poor diet and subsequent lower extremity edema. She underwent endovenous laser ablation of her left greater saphenous vein in 2010. She underwent laser ablation of accessory branch of left GSV in April 2016 by Dr. Kellie Simmering at [facility]. She was previously wearing [place] boots, which she tolerated well. Tolerating 2 layer compression and cadexomer iodine. She returns to clinic for follow-up and is without  new complaints. She denies any significant pain at this time. She reports persistent pain with pressure. No claudication or ischemic rest pain. No fever or chills. No drainage. READMISSION 11/13/16; this is a 74 year old woman who is not a diabetic. She is here for a review of a painful area on her left medial lower extremity. I note that she was seen here previously last year for wound I believe to be in the same area. At that time she had undergone  previously a left greater saphenous vein ablation by Dr. Kellie Simmering and she had a ablation of the anterior accessory branch of the left greater saphenous vein in March 2016. Seeing that the wound actually closed over. In reviewing the history with her today the ulcer in this area has been recurrent. She describes a biopsy of this area in 2009 that only showed stasis physiology. 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 Do not see that she has ever had arterial studies in the left leg. She tells me she has a follow-up with Dr. Kellie Simmering in roughly 10 days In any case she developed the reopening of this area roughly a month ago. On the background of this she describes rapidly increasing edema which has responded to Lasix 40 mg and metolazone 2.5 mg as well as the patient's lymph massage. She has been told she has both venous insufficiency and lymphedema but she cannot tolerate compression stockings 11/28/16; the patient saw Dr. Kellie Simmering recently. Per the patient he did arterial Dopplers in the office that did not show evidence of arterial insufficiency, per the patient he stated "treat this like an ordinary venous ulcer". She also saw her dermatologist Dr. Ronnald Ramp who felt that this was more of a vascular ulcer. In general things are improving although she arrives today with increasing bilateral lower extremity edema with weeping a deeper fluid through the wound on the left medial leg compatible with some degree of lymphedema 12/04/16; the patient's wound is fully epithelialized but I don't think fully healed. We will do another week of depression with Promogran and TCA however I suspect we'll be able to discharge her next week. This is a very unusual-looking wound which was initially a  figure-of-eight type wound lying on its side surrounded by petechial like hemorrhage. She has had venous ablation on this side. She apparently does not have an arterial issue per Dr. Kellie Simmering. She saw her dermatologist thought it was "vascular". Patient is definitely going to need ongoing compression and I talked about this with her today she will go to elastic therapy after she leaves here next week 12/11/16; the patient's wound is not completely closed today. She has surrounding scar tissue and in further discussion with the patient it would appear that she had ulcers in this area in 2009 for a prolonged period of time ultimately requiring a punch biopsy of this area that only showed venous insufficiency. I did not previously pickup on this part of the history from the patient. 12/18/16; the patient's wound is completely epithelialized. There is no open area here. She has significant bilateral venous insufficiency with secondary lymphedema to a mild-to-moderate degree she does not have compression stockings.. She did not say anything to me when I was in the room, she told our intake nurse that  she was still having pain in this area. This isn't unusual recurrent small open area. She is going to go to elastic therapy to obtain compression stockings. 12/25/16; the patient's wound is fully epithelialized. There is no open area here. The patient describes some continued episodic discomfort in this area medial left calf. However everything looks fine and healed here. She is been to elastic therapy and caught herself 15-20 mmHg stockings, they apparently were having trouble getting 20-30 mm stockings in her size 01/22/17; this is a 74 year old we discharged from the clinic a month ago. She has a recurrent open wound on her medial left calf. She had 15 mm support stockings. I told her I thought she needed 20-30 mm compression stockings. She tells me that she has been ill with hospitalization secondary to asthma and  is been found to have severe hypokalemia likely secondary to a combination of Lasix and metolazone. This morning she noted blistering and leaking fluid on the posterior part of her left leg. She called our intake nurse urgently and we was saw her this afternoon. She has not had any real discomfort here. I don't know that she's been wearing any stockings on this leg for at least 2-3 days. ABIs in this clinic were 1.21 on the right and 1.3 on the left. She is previously seen vascular surgery who does not think that there is a peripheral arterial issue. 01/30/17; Patient arrives with no open wound on the left leg. She has been to elastic therapy and obtained 20-22mmhg below knee stockings and she has one on the right leg today. READMISSION 02/19/18; this Kunda is a now 74 year old patient we've had in this clinic perhaps 3 times before. I had last looked at her from January 07 December 2016 with an area on the medial left leg. We discharged her on 12/25/16 however she had to be readmitted on 01/22/17 with a recurrence. I have in my notes that we discharged her on 20-30 mm stockings although she tells me she was only wearing support hose because she cannot get stockings on predominantly related to her cervical spine surgery/issues. She has had previous ablations done by vein and vascular in [place] including a great saphenous vein ablation on the left with an anterior accessory branch ablation I think both of these were in 2016. On one of the previous visit she had a biopsy noted 2009 that was negative. She is not felt to have an arterial issue. She is not a diabetic. She does have a history of obstructive sleep apnea hypertension asthma as well as chronic venous insufficiency and lymphedema. On this occasion she noted 2 dry scaly patch on her left leg. She tried to put lotion on this it didn't really help. There were 2 open areas.the patient has been seeing her primary physician from 02/05/18 through  02/14/18. She had Unna boots applied. The superior wound now on the lateral left leg has closed but she's had one wound that remains open on the lateral left leg. This is not the same spot as we dealt with in 2018. ABIs in this clinic were 1.3 bilaterally [name], [name] ([id number]) 02/26/18; patient has a small wound on the left lateral calf. Dimensions are down. She has chronic venous insufficiency and lymphedema. 03/05/18; small open area on the left lateral calf. Dimensions are down. Tightly adherent necrotic debris over the surface of the wound which was difficult to remove. Also the dressing [over collagen] stuck to the wound surface. This was removed with  some difficulty as well. Change the primary dressing to Hydrofera Blue ready 03/12/18; small open area on the left lateral calf. Comes in with tightly adherent surface eschar as well as some adherent Hydrofera Blue. 03/19/18; open area on the left lateral calf. Again adherent surface eschar as well as some adherent Hydrofera Blue nonviable subcutaneous tissue. She complained of pain all week even with the reduction from 4-3 layer compression I put on last week. Also she had an increase in her ankle and calf measurements probably related to the same thing. 03/26/18; open area on the left lateral calf. A very small open area remains here. We used silver alginate starting last week as the Hydrofera Blue seem to stick to the wound bed. In using 4-layer compression 04/02/18; the open area in the left lateral calf at some adherent slough which I removed there is no open area here. We are able to transition her into her own compression stocking. Truthfully I think this is probably his support hose. However this does not maintain skin integrity will be limited. She cannot put over the toe compression stockings on because of neck problems hand problems etc. She is allergic to the lining layer of juxta lites. We might be forced to use extremitease stocking  should this fail READMIT 11/24/2018 Patient is now a 74 year old woman who is not a diabetic. She has been in this clinic on at least 3 previous occasions largely with recurrent wounds on her left leg secondary to chronic venous insufficiency with secondary lymphedema. Her situation is complicated by inability to get stockings on and an allergy to neoprene which is apparently a component and at least juxta lites and other stockings. As a result she really has not been wearing any stockings on her legs. She tells [place] that roughly 2 or 3 weeks ago she started noticing a stinging sensation just above her ankle on the left medial aspect. She has been diagnosed with pseudogout and she wondered whether this was what she was experiencing. She tried to dress this with something she bought at the store however subsequently it pulled skin off and now she has an open wound that is not improving. She has been using Vaseline gauze with a cover bandage. She saw her primary doctor last week who put an Haematologist on her. ABIs in this clinic was 1.03 on the left 2/12; the area is on the left medial ankle. Odd-looking wound with what looks to be surface epithelialization but a multitude of small petechial openings. This clearly not closed yet. We have been using silver alginate under 3 layer compression with TCA 2/19; the wound area did not look quite as good this week. Necrotic debris over the majority of the wound surface which required debridement. She continues to have a multitude of what looked to be small petechial openings. She reminds [place] that she had a biopsy on this initially during her first outbreak in 2015 in [place] [facility]. She expresses concern about this being a possible melanoma. 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 I assured her that this does not look anything like melanoma. Except for the petechial  reaction it does look like a venous insufficiency area and she certainly has evidence of this on both sides 2/26; a difficult area on the left medial ankle. The patient clearly has chronic venous hypertension with some degree of lymphedema. The odd thing about the area is the  small petechial hemorrhages. I am not really sure how to explain this. This was present last time and this is not a compression injury. We have been using Hydrofera Blue which I changed to last week 3/4; still using Hydrofera Blue. Aggressive debridement today. She does not have known arterial issues. She has seen Dr. Kellie Simmering at [facility] vein and vascular and and has an ablation on the left. [Anterior accessory branch of the greater saphenous]. From what I remember they did not feel she had an arterial issue. The patient has had this area biopsied in 2009 at [facility] dermatology and by her recollection they said this was "stasis". She is also follow-up with dermatology locally who thought that this was more of a vascular issue 3/11; using Hydrofera Blue. Aggressive debridement today. She does not have an arterial issue. We are using 3 layer compression although we may need to go to 4. The patient has been in for multiple changes to her wrap since I last saw her a week ago. She says that the area was leaking. I do not have too much more information on what was found 01/19/19 on evaluation today patient was actually being seen for a nurse visit when unfortunately she had the area on her left lateral lower extremity as well as weeping from the right lower extremity that became apparent. Therefore we did end up actually seeing her for a full visit with myself. She is having some pain at this site as well but fortunately nothing too significant at this point. No fevers, chills, nausea, or vomiting noted at this time. 3/18-Patient is back to the clinic with the left leg venous leg ulcer, the ulcer is larger in size, has a surface that  is densely adherent with fibrinous tissue, the Hydrofera Blue was used but is densely adherent and there was difficulty in removing it. The right lower extremity was also wrapped for weeping edema. Patient has a new area over the left lateral foot above the malleolus that is small and appears to have no debris with intact surrounding skin. Patient is on increased dose of Lasix also as a means to edema management 3/25; the patient has a nonhealing venous ulcer on the medial left leg and last week developed a smaller area on the lateral left calf. We have been using Hydrofera Blue with a contact layer. 4/1; no major change in these wounds areas. Left medial and more recently left lateral calf. I tried Iodoflex last week to aid in debridement she did not tolerate this. She stated her pain was terrible all week. She took the top layer of the 4 layer compression off. 4/8; the patient actually looks somewhat better in terms of her more prominent left lateral calf wound. There is some healthy looking tissue here. She is still complaining of a lot of discomfort. 4/15; patient in a lot of pain secondary to sciatica. She is on a prednisone taper prescribed by her primary physician. She has the 2 areas one on the left medial and more recently a smaller area on the left lateral calf. Both of these just above the malleoli 4/22; her back pain is better but she still states she is very uncomfortable and now feels she is intolerant to the [employer]. No real change in the wounds we have been using Sorbact. She has been previously intolerant to Iodoflex. There is not a lot of option about what we can use to debride this wound under compression that she no doubt needs. sHe  states Ultram no longer works for her pain 4/29; no major change in the wounds slightly increased depth. Surface on the original medial wound perhaps somewhat improved however the more recent area on the lateral left ankle is 100% covered in very  adherent debris we have been using Sorbact. She tolerates 4 layer compression well and her edema control is a lot better. She has not had to come in for a nurse check 5/6; no major change in the condition of the wounds. She did consent to debridement today which was done with some difficulty. Continuing Sorbact. She did not tolerate Iodoflex. She was in for a check of her compression the day after we wrapped her last week this was adjusted but nothing much was found 5/13; no major change in the condition or area of the wounds. I was able to get a fairly aggressive debridement done on the lateral left leg wound. Even using Sorbact under compression. She came back in on Friday to have the wrap changed. She says she felt uncomfortable on the lateral aspect of her ankle. She has a long history of chronic venous insufficiency including previous ablation surgery on this side. 5/20-Patient returns for wounds on left leg with both wounds covered in slough, with the lateral leg wound larger in size, she has been in 3 layer compression and felt more comfortable, she describes pain in ankle, in leg and pins and needles in foot, and is about to try Pamelor for this 6/3; wounds on the left lateral and left medial leg. The area medially which is the most recent of the 2 seems to have had the largest increase in [name], [name]. ([id number]) dimensions. We have been using Sorbac to try and debride the surface. She has been to see orthopedics they apparently did a plain x-ray that was indeterminant. Diagnosed her with neuropathy and they have ordered an MRI to determine if there is underlying osteomyelitis. This was not high on my thought list but I suppose it is prudent. We have advised her to make an appointment with vein and vascular in [place]. She has a history of a left greater saphenous and accessory vein ablations I wonder if there is anything else that can be done from a surgical point of view to help in  these difficult refractory wounds. We have previously healed this wound on one occasion but it keeps on reopening [medial side] 6/10; deep tissue culture I did last week I think on the left medial wound showed both moderate E. coli and moderate staph aureus [MSSA]. She is going to require antibiotics and I have chosen Augmentin. We have been using Sorbact and we have made better looking wound surface on both sides but certainly no improvement in wound area. She was back in last Friday apparently for a dressing changes the wrap was hurting her outer left ankle. She has not managed to get a hold of vein and vascular in [place]. We are going to have to make her that appointment 6/17; patient is tolerating the Augmentin. She had an MRI that I think was ordered by orthopedic surgeon this did not show osteomyelitis or an abscess did suggest cellulitis. We have been using Sorbact to the lateral and medial ankles. We have been trying to arrange a follow-up appointment with vein and vascular in [place] or did her original ablations. We apparently an area sent the request to vein and vascular in [facility] 6/24; patient has completed the Augmentin. We do not yet have a  vein and vascular appointment in [place]. I am not sure what the issue is here we have asked her to call tomorrow. We are using Sorbact. Making some improvements and especially the medial wound. Both surfaces however look better medial and lateral. 7/1; the patient has been in contact with vein and vascular in [place] but has not yet received an appointment. Using Sorbact we have gradually improve the wound surface with no improvement in surface area. She is approved for Apligraf but the wound surface still is not completely viable. She has not had to come in for a dressing change 7/8; the patient has an appointment with vein and vascular on 7/31 which is a Friday afternoon. She is concerned about getting back here for [place] to dress  her wounds. I think it is important to have them goal for her venous reflux/history of ablations etc. to see if anything else can be done. She apparently tested positive for 1 of the blood tests with regards to lupus and saw a rheumatologist. He has raised the issue of vasculitis again. I have had this thought in the past however the evidence seems overwhelming that this is a venous reflux etiology. If the rheumatologist tells me there is clinical and laboratory investigation is positive for lupus I will rethink this. 7/15; the patient's wound surfaces are quite a bit better. The medial area which was her original wound now has no depth although the lateral wound which was the more recent area actually appears larger. Both with viable surfaces which is indeed better. Using Sorbact. I wanted to use Apligraf on her however there is the issue of the vein and vascular appointment on 7/31 at 2:00 in the afternoon which would not allow her to get back to be rewrapped and they would no doubt remove the graft 7/22; the patient's wound surfaces have moderate amount of debris although generally look better. The lateral one is larger with 2 small satellite areas superiorly. We are waiting for her vein and vascular appointment on 7/31. She has been approved for Apligraf which I would like to use after th 7/29; wound surfaces have improved no debridement is required we have been using Sorbact. She sees vein and vascular on Friday with this so question of whether anything can be done to lessen the likelihood of recurrence and/or speed the healing of these areas. She is already had previous ablations. She no doubt has severe venous hypertension 8/5-Patient returns at 1 week, she was in [place] for 3 days by her podiatrist, we have been using so backed to the wound, she has increased pain in both the wounds on the left lower leg especially the more distal one on the lateral aspect 8/12-Patient returns at 1 week  and she is agreeable to having debridement in both wounds on her left leg today. We have been using Sorbact, and vascular studies were reviewed at last visit 8/19; the patient arrives with her wounds fairly clean and no debridement is required. We have used Sorbact which is really done a nice job in cleaning up these very difficult wound surfaces. The patient saw Dr. Donzetta Matters of vascular surgery on 7/31. He did not feel that there was an arterial component. He felt that her treated greater saphenous vein is adequately addressed and that the small saphenous vein did not appear to be involved significantly. She was also noted to have deep venous reflux which is not treatable. Dr. Donzetta Matters mentioned the possibility of a central obstructive component leading  to reflux and he offered her central venography. She wanted to discuss this or think about it. I have urged her to go ahead with this. She has had recurrent difficult wounds in these areas which do heal but after months in the clinic. If there is anything that can be done to reduce the likelihood of this I think it is worth it. 9/2 she is still working towards getting follow-up with Dr. Donzetta Matters to schedule her CT. Things are quite a bit worse venography. I put Apligraf on 2 weeks ago on both wounds on the medial and lateral part of her left lower leg. She arrives in clinic today with 3 superficial additional wounds above the area laterally and one below the wound medially. She describes a lot of discomfort. I think these are probably wrapped injuries. Does not look like she has cellulitis. 07/20/2019 on evaluation today patient appears to be doing somewhat poorly in regard to her lower extremity ulcers. She in fact showed signs of erythema in fact we may even be dealing with an infection at this time. Unfortunately I am unsure if this is just infection or if indeed there may be some allergic reaction that occurred as a result of the Apligraf application. With  that being said that would be unusual but nonetheless not impossible in this patient is one who is unfortunately allergic to quite a bit. Currently we have been using the Sorbact which seems to do as well as anything for her. I do think we may want to obtain a culture today to see if there is anything showing up there that may need to be addressed. 9/16; noted that last week the wounds look worse in 1 week follow-up of the Apligraf. Using Sorbact as of 2 days ago. She arrives with copious amounts of drainage and new skin breakdown on the back of the left calf. The wounds arm more substantial bilaterally. There is a fair amount of swelling in the left calf no overt DVT there is edema present I think in the left greater than right thigh. She is supposed to go on 9/28 for CT venography. The wounds on the medial and lateral calf are worse and she has new skin breakdown posteriorly at least new for me. This is almost developing into a circumferential wound area The Apligraf was taken off last week which I agree with things are not going in the right direction a culture was done we do not have that back yet. She is on Augmentin that she started 2 days ago 9/23; dressing was changed by her nurses on Monday. In general there is no improvement in the wound areas although the area looks less angry than last week. She did get Augmentin for MSSA cultured on the 14th. She still appears to have too much swelling in the left leg even with 3 layer compression 9/30; the patient underwent her procedure on 9/28 by Dr. Donzetta Matters at vascular and vein specialist. She was discovered to have the common iliac vein measuring 12.2 mm but at the level of L4-L5 measured 3 mm. After stenting it measured 10 mm. 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 Rouleaux flow in the common femoral and femoral vein was observed much improved after stenting. We are using silver alginate to the wounds on the medial and lateral  ankle on the left. 4 layer compression 10/7; the patient had fluid swelling around her knee and 4 layer compression. At the advice of vein and vascular  this was reduced to 3 layer which she is tolerating better. We have been using silver alginate under 3 layer compression since last Friday 10/14; arrives with the areas on the left ankle looking a lot better. Inflammation in the area also a lot better. She came in for a nurse check on 10/9 10/21; continued nice improvement. Slight improvements in surface area of both the medial and lateral wounds on the left. A lot of the satellite lesions in the weeping erythema around these from stasis dermatitis is resolved. We have been using silver alginate ALDONIA, KEEVEN ([id number]) 10/28; general improvement in the entire wound areas although not a lot of change in dimensions the wound certainly looks better. There is a lot less in terms of venous inflammation. Continue silver alginate this week however look towards Hydrofera Blue next week 11/4; very adherent debris on the medial wound left wound is not as bad. We have been using silver alginate. Change to [facility] today 11/11; very adherent debris on both wound areas. She went to vein and vascular last week and follow-up they put in [place] boot on this today. He says the [facility] was adherent. Wound is definitely not as good as last week. Especially on the left there the satellite lesions look more prominent 11/18; absolutely no better. erythema on lateral aspect with tenderness. 09/30/2019 on evaluation today patient appears to actually be doing better. Dr. Dellia Nims did put her on doxycycline last week which I do believe has helped her at this point. Fortunately there is no signs of active infection at this time. No fevers, chills, nausea, vomiting, or diarrhea. I do believe he may want extend the doxycycline for 7 additional days just to ensure everything does completely cleared up the patient is  in agreement with that plan. Otherwise she is going require some sharp debridement today 12/2; patient is completing a 2-week course of doxycycline. I gave her this empirically for inflammation as well as infection when I last saw her 2 weeks ago. All of this seems to be better. She is using silver alginate she has the area on the medial aspect of the larger area laterally and the 2 small satellite regions laterally above the major wound. 12/9; the patient's wound on the left medial and left lateral calf look really quite good. We have been using silver alginate. She saw vein and vascular in follow-up on 10/09/2019. She has had a previous left greater saphenous vein ablation by Dr. Oscar La in 2016. More recently she underwent a left common iliac vein stent by Dr. 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 The swelling is improved and certainly the wounds have improved. The patient shows [place] today area on the right medial calf there is almost no wound but leaking lymphedema. She says she start this started 3 or 4 days ago. She did not traumatize it. It is not painful. She does not wear compression on that side 12/16; the patient continues to do well laterally. Medially still requiring debridement. The area on the right calf did not materialize to anything and is not currently open. We wrapped this last time. She has support stockings for that leg although I am not sure they are going to provide adequate compression 12/23; the lateral wound looks stable. Medially still requiring debridement for tightly adherent fibrinous debris. We've been using silver alginate. Surface area not any different 12/30; neither wound is any better with regards to surface and the area on the left lateral  is larger. I been using silver alginate to the left lateral which look quite good last week and Sorbact to the left medial 11/11/2019. Lateral wound area actually looks better and somewhat smaller. Medial still  requires a very aggressive debridement today. We have been using Sorbact on both wound areas 1/13; not much better still adherent debris bilaterally. I been using Sorbact. She has severe venous hypertension. Probably some degree of dermal fibrosis distally. I wonder whether tighter compression might help and I am going to try that today. We also need to work on the bioburden 1/20; using Sorbact. She has severe venous hypertension status post stent placement for pelvic vein compression. We applied gentamicin last time to see if we could reduce bioburden I had some discussion with her today about the use of pentoxifylline. This is occasionally used in this setting for wounds with refractory venous insufficiency. However this interacts with Plavix. She tells me that she was put on this after stent placement for 3 months. She will call Dr. Claretha Cooper office to discuss 1/27; we are using gentamicin under Sorbact. 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 She has a stent. Wound medially is measuring smaller this week. Laterally measuring slightly larger although she has some satellite lesions superiorly 2/3; gentamicin under Sorbact under 4-layer compression. 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 She has a stent on Plavix. Her wounds are measuring smaller this week. More substantially laterally where there is a satellite lesion superiorly. 2/10; gentamicin under Sorbac. 4-layer compression. Patient communicated with Dr. Donzetta Matters at vein and vascular in [place]. He is okay with the patient coming off Plavix I will therefore start her on pentoxifylline for a 1 month trial. In general her wounds look better today. I had some concerns about swelling in the left thigh however she measures 61.5 on the right and 63 on the mid thigh which does not suggest there is any difficulty. The patient is not describing any pain. 2/17; gentamicin under Sorbac 4-layer  compression. She has been on pentoxifylline for 1 week and complains of loose stool. No nausea she is eating and drinking well 2/24; the patient apparently came in 2 days ago for a nurse visit when her wrap fell down. Both areas look a little worse this week macerated medially and satellite lesions laterally. Change to silver alginate today 3/3; wounds are larger today especially medially. She also has more swelling in her foot lower leg and I even noted some swelling in her posterior thigh which is tender. I wonder about the patency of her stent. Fortuitously she sees Dr. Claretha Cooper group on Friday 3/10; Mrs. Prentiss was seen by vein and vascular on 3/5. The patient underwent ultrasound. There was no evidence of thrombosis involving the IVC no evidence of thrombosis involving the right common iliac vein there is no evidence of thrombosis involving the right external iliac vein the left external vein is also patent. The right common iliac vein stent appears patent bilateral common femoral veins are compressible and appear patent. I was concerned about the left common iliac stent however it looks like this is functional. She has some edema in the posterior thigh that was tender she still has that this week. I also note they had trouble finding the pulses in her left foot and booked her for an ABI baseline in 4 weeks. She will follow up in 6 months for repeat IVC duplex. The patient stopped the pentoxifylline because of diarrhea. It does not look like  that was being effective in any case. I have advised her to go back on her aspirin 81 mg tablet, vascular it also suggested this 3/17; comes in today with her wound surfaces a lot better. The excoriations from last week considerably better probably secondary to the TCA. We have been using silver alginate 3/24; comes in today with smaller wounds both medially and laterally. Both required debridement. There are 2 small satellite areas  superiorly laterally. She also has a very odd bandlike area in the mid calf almost looking like there was a weakness in the wrap in a localized area. I would write this off as being this however anteriorly she has a small raised ballotable area that is very tender almost reminiscent of an abscess but there was no obvious purulent surface to it. 02/04/20 upon evaluation today patient appears to be doing fairly well in regard to her wounds today. Fortunately there is no signs of active infection at this time. No fevers, chills, nausea, vomiting, or diarrhea. She has been tolerating the dressing changes without complication. Fortunately I feel like she is showing signs of improvement although has been sometime since have seen her. Nonetheless the area of concern that Dr. Dellia Nims had last week where she had possibly an area of the wrap that was we can allow the leg to bulge appears to be doing significantly better today there is no signs of anything worsening. LEALER, MARSLAND ([id number]) 4/7; the patient's wounds on her medial and lateral left leg continue to contract. We have been using a regular alginate. Last week she developed an area on the right medial lower leg which is probably a venous ulcer as well. 4/14; the wounds on her left medial and lateral lower leg continue to contract. Surface eschar. We have been using regular alginate. The area on the right medial lower leg is closed. We have been putting both legs under 4-layer contraction. The patient went back to see vein and vascular she had arterial studies done which were apparently "quite good" per the patient although I have not read their notes I have never felt she had an arterial issue. 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 This is been longstanding and refractory to exercise, leg elevation and longstanding use of compression wraps in our clinic as well as compression stockings on the times  we have been able to get these to heal 4/21; we thought she actually might be close this week however she arrives in clinic with a lot of edema in her upper left calf and into her posterior thigh. This is been an intermittent problem here. She says the wrap fell down but it was replaced with a nurse visit on Monday. We are using calcium alginate to the wounds and the wound sizes there not terribly larger than last week but there is a lot more edema 4/28; again wound edges are smaller on both sides. Her edema is better controlled than last time. She is obtained her compression pumps from [employer] although they have not been to her home to set these up. 5/5; left medial and left lateral both look stable. I am not sure the medial is any smaller. We have been using calcium alginate under 4-layer compression. She had an area on the right medial. This was eschared today. We have been wrapping this as well. She does not tolerate external compression stockings due to a history of various contact allergies. She has her compression pumps however the  representative from the company is coming on her to show her how to use these tomorrow 5/19; patient with severe chronic venous insufficiency secondary to central venous disease. She had a stent placed in her left common iliac vein. She has done better since but still difficult to control wounds. She comes in today with nothing open on the right leg. Her areas on the left medial and left lateral are just about closed. We are using calcium alginate under 4-layer compression. She is using her external compression pumps at home She only has 15-20 support stockings. States she cannot get anything tighter than that on. 03/30/20-Patient returns at 1 week, the wounds on the left leg are both slightly bigger, the last week she was on 3 layer compression which started to slide down. She is starting to use her lymphedema pumps although she stated on 1 day her right  ankle started to swell up and she have to stop that day. Unfortunately the open area seem to oscillate between improving to the point of healing and then flaring up all to do with effectiveness of compression or lack of due to the left leg topography not keeping the compression wraps from rolling down 6/2 patient comes in with a 15/20 mmHg stocking on the right leg. She tells me that she developed a lot of swelling in her ankles she saw orthopedics she was felt to possibly be having a flare of pseudogout versus some other type of arthritis. She was put on steroids for a respiratory issue so that helps with the inflammation. She has not been using the pumps all week. She thinks the left thigh is more swollen than usual and I would agree with that. She has an appointment with Dr. Donzetta Matters 9 days or so from now 6/9; both wounds on the left medial and left lateral are smaller. We have been using calcium alginate under compression. She does not have an open wound on the right leg she is using a stocking and her compression pumps things are going well. She has an appointment with Dr. Donzetta Matters with regards to her stent in the left common iliac vein 6/16; the wounds on the left medial and left lateral ankle continues to contract. The patient saw Dr. Donzetta Matters and I think he seems satisfied. Ordered follow-up venous reflux studies on both sides in September. Cautioned that she may need thigh-high stockings. She has been using calcium alginate under compression on the left and her own stocking on the right leg. She tells [place] there are no open wounds on the right 6/23; left lateral is just about closed. Medial required debridement today. We have been using calcium alginate. Extensive discussion about the compression pumps she is only using these on 25 mmHg states she could not take 40 or 30 when the wrap came out to her home to demonstrate these. He said they should not feel tight 6/30; the left lateral wound has a slight  amount of eschar. . The area medially is about the same using Hydrofera Blue. 7/7; left lateral wound still has some eschar. I will remove this next week may be closed. The area medially is very small using Hydrofera Blue with improvement. Unfortunately the stockings fell down. Unfortunately the blisters have developed at the edge of where the wrap fell. When this happened she says her legs hurt she did not use her pumps. We are not open Monday for her to come in and change the wraps and she had an appointment yesterday. She  also tells me that she is going to have an MRI of her back. She is having pain radiating into her left anterior leg she thinks her from an L5 disc. She saw Dr. Ellene Route of neurosurgery 7/14; the area on the left lateral ankle area is closed. Still a small area medially however it looks better as well. We have been using Hydrofera Blue under 4-layer compression 7/21; left lateral ankle is still closed however her wound on the medial left calf is actually larger. This is probably because Hydrofera Blue got stuck to the wound. She came in for a nurse change on Friday and will do that again this week I was concerned about the amount of swelling that she had last week however she is using her compression pumps twice a day and the swelling seems well controlled 7/28; remaining wound on the left medial lower leg is smaller. We have been using moistened silver collagen under compression she is coming back for a nurse visit. For reasons that were not really clear she was just keeping her legs elevated and not using her compression pumps. I have asked her to use the compression pumps. She does not have any wounds on the right leg Objective Constitutional Patient is hypertensive.. Pulse regular and within target range for patient.Marland Kitchen Respirations regular, non-labored and within target range.. Temperature is normal and within the target range for the patient.Marland Kitchen appears in no distress. Vitals  Time Taken: 12:35 PM, Height: 63 in, Weight: 224.7 lbs, BMI: 39.8, Temperature: 98.1 F, Pulse: 78 bpm, Respiratory Rate: 18 Kubicki, Oaklyn J. ([id number]) breaths/min, Blood Pressure: 156/69 mmHg. Cardiovascular Pedal pulses are pedal pulses. She has good to just below the left knee. Then it expands there is also nonpitting edema up into her thighs. General Notes: Wound exam; the areas on the left medial lower leg just above the ankle. The surface looks healthy no debridement is required. Surface area measurements are better there is no evidence of surrounding infection. Integumentary (Hair, Skin) No erythema no erythema around the wound. Wound #5 status is Open. Original cause of wound was Gradually Appeared. The wound is located on the Left,Medial Lower Leg. The wound measures 1.6cm length x 0.9cm width x 0.1cm depth; 1.131cm^2 area and 0.113cm^3 volume. There is Fat Layer (Subcutaneous Tissue) Exposed exposed. There is a medium amount of serosanguineous drainage noted. The wound margin is flat and intact. There is large (67-100%) red granulation within the wound bed. There is a small (1-33%) amount of necrotic tissue within the wound bed including Adherent Slough. 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 There was a Four Layer Compression Therapy Procedure with a pre-treatment ABI of 1 by Cornell Barman, RN. Post procedure Diagnosis Wound #5: Same as Pre-Procedure Plan Anesthetic (add to Medication List): Wound #5 Left,Medial Lower  Leg: Topical Lidocaine 4% cream applied to wound bed prior to debridement (In Clinic Only). Primary Wound Dressing: Wound #5 Left,Medial Lower Leg: Silver Collagen - moistened with hydrogel Secondary Dressing: Wound #5 Left,Medial Lower Leg: ABD pad Dressing Change Frequency: Wound #5 Left,Medial Lower Leg: Change dressing every week Other: - Nurse visit  Friday and Monday if needed. Follow-up Appointments: Wound #5 Left,Medial Lower Leg: Return Appointment in 1 week. Nurse Visit as needed Edema Control: Wound #5 Left,Medial Lower Leg: 4-Layer Compression System - Left Lower Extremity. Patient to wear own compression stockings - On Right Compression Pump: Use compression pump on left lower extremity for 60 minutes, twice daily. Compression Pump: Use compression pump on right lower extremity for 60 minutes, twice daily. [name], [name] ([id number]) 1 continue with silver collagen/ABDs under 4-layer compression 2. I have asked her to get back into her compression pumps. The edema control above her wraps is helpful in relieving venous pressure in the wound area. Electronic Signature(s) Signed: 06/01/2020 3:56:44 PM By: Linton Ham MD Entered By: Linton Ham on 06/01/2020 13:07:11 Birdsall, [name] ([id number]) -------------------------------------------------------------------------------- SuperBill Details Patient Name: BRITANIA, SHREEVE. Date of Service: 06/01/2020 Medical Record Number: [id number] Patient Account Number: [id number] Date of Birth/Sex: 1946/04/27 (74 y.o. 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 Facility Procedures CPT4 Code: [id number] Description: (Facility Use Only) [id number] - [place] LWR LT LEG Modifier: Quantity: 1 Physician Procedures CPT4 Code Description: [id number] 19758 - WC PHYS LEVEL 3 - EST PT Modifier: Quantity: 1 CPT4 Code Description: ICD-10 Diagnosis Description L97.221 Non-pressure chronic ulcer of left calf limited to breakdown of skin I89.0 Lymphedema, not elsewhere classified I87.332 Chronic venous hypertension (idiopathic) with ulcer and inflammation of Modifier: left lower extremity Quantity: Electronic Signature(s) Signed: 06/01/2020 3:56:44 PM By: Linton Ham MD Signed: 06/01/2020 4:56:00 PM By: [name], [name], RN, CWS, [name], [name] Entered By: [name], [name], RN, CWS, Kim on 06/01/2020 13:08:58

## 2020-06-03 ENCOUNTER — Other Ambulatory Visit: Payer: Self-pay

## 2020-06-03 DIAGNOSIS — I82592 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: Secondary | ICD-10-CM | POA: Diagnosis not present

## 2020-06-03 DIAGNOSIS — I87332 Chronic venous hypertension (idiopathic) with ulcer and inflammation of left lower extremity: Secondary | ICD-10-CM | POA: Diagnosis not present

## 2020-06-03 DIAGNOSIS — I83009 Varicose veins of unspecified lower extremity with ulcer of unspecified site: Secondary | ICD-10-CM | POA: Diagnosis not present

## 2020-06-03 DIAGNOSIS — I890 Lymphedema, not elsewhere classified: Secondary | ICD-10-CM | POA: Diagnosis not present

## 2020-06-03 DIAGNOSIS — L97221 Non-pressure chronic ulcer of left calf limited to breakdown of skin: Secondary | ICD-10-CM | POA: Diagnosis not present

## 2020-06-03 NOTE — Progress Notes (Signed)
KARISTA, AISPURO ([id number]) Visit Report for 06/03/2020 Arrival Information Details Patient Name: [name], [name]. Date of Service: 06/03/2020 8:00 AM Medical Record Number: [id number] Patient Account Number: [id number] Date of Birth/Sex: 21-May-1946 (74 y.o. F) Treating RN: [name] Canavan Primary Care Teia Freitas: [name] Other Clinician: Referring Ronasia Isola: [name] Treating Rio Taber/Extender: Melburn Hake, HOYT Weeks in Treatment: 77 Visit Information History Since Last Visit Added or deleted any medications: No Patient Arrived: Ambulatory Had a fall or experienced change in No Arrival Time: 08:08 activities of daily living that may affect Accompanied By: self risk of falls: Transfer Assistance: None Hospitalized since last visit: No Patient Requires Transmission-Based No Pain Present Now: No Precautions: Patient Has Alerts: Yes Patient Alerts: Patient on Blood Thinner aspirin 81 Electronic Signature(s) Signed: 06/03/2020 2:53:32 PM By: [name] Canavan Entered By: [name] Canavan on 06/03/2020 08:24:51 Finch, [name] ([id number]) -------------------------------------------------------------------------------- Compression Therapy Details Patient Name: [name], [name]. Date of Service: 06/03/2020 8:00 AM Medical Record Number: [id number] Patient Account Number: [id number] Date of Birth/Sex: 09-11-1946 (74 y.o. F) Treating RN: [name] Canavan Primary Care Jenascia Bumpass: [name] Other Clinician: Referring Andrews Tener: [name] Treating Rosi Secrist/Extender: Melburn Hake, HOYT Weeks in Treatment: 77 Compression Therapy Performed for Wound Assessment: Wound #5 Left,Medial Lower Leg Performed By: Ponciano Ort, RN Compression Type: Four Layer Pre Treatment ABI: 1 Electronic Signature(s) Signed: 06/03/2020 2:53:32 PM By: [name] Canavan Entered By: [name] Canavan on 06/03/2020 08:38:04 BRITTON, PERKINSON  ([id number]) -------------------------------------------------------------------------------- Encounter Discharge Information Details Patient Name: [name], [name]. Date of Service: 06/03/2020 8:00 AM Medical Record Number: [id number] Patient Account Number: [id number] Date of Birth/Sex: 12/13/1945 (74 y.o. F) Treating RN: [name] Canavan Primary Care Gavinn Collard: [name] Other Clinician: Referring Sherise Geerdes: [name] Treating Oluwakemi [name]/Extender: Melburn Hake, HOYT Weeks in Treatment: 30 Encounter Discharge Information Items Discharge Condition: Stable Ambulatory Status: Ambulatory Discharge Destination: Home Transportation: Private Auto Accompanied By: self Schedule Follow-up Appointment: No Clinical Summary of Care: Electronic Signature(s) Signed: 06/03/2020 2:53:32 PM By: [name] Canavan Entered By: [name] Canavan on 06/03/2020 08:49:18 Pfahler, [name] ([id number]) -------------------------------------------------------------------------------- Wound Assessment Details Patient Name: [name], [name]. Date of Service: 06/03/2020 8:00 AM Medical Record Number: [id number] Patient Account Number: [id number] Date of Birth/Sex: 10/23/1946 (74 y.o. F) Treating RN: [name] Canavan Primary Care Tacarra Justo: [name] Other Clinician: Referring Cristal Qadir: [name] Treating Ismaeel Arvelo/Extender: Melburn Hake, HOYT Weeks in Treatment: 43 Wound Status Wound Number: 5 Primary Lymphedema Etiology: Wound Location: Left, Medial Lower Leg Wound Open Wounding Event: Gradually Appeared Status: Date Acquired: 11/19/2018 Comorbid Cataracts, Asthma, Sleep Apnea, Deep Vein Thrombosis, Weeks Of Treatment: 77 History: Hypertension, Peripheral Venous Disease, Osteoarthritis, Clustered Wound: No Received Chemotherapy, Received Radiation Photos Wound Measurements Length: (cm) 1.5 Width: (cm) 0.6 Depth: (cm) 0.1 Area: (cm) 0.707 Volume: (cm) 0.071 % Reduction in Area:  91.7% % Reduction in Volume: 91.7% Epithelialization: Small (1-33%) Tunneling: No Undermining: No Wound Description Classification: Full Thickness Without Exposed Support Structu Wound Margin: Flat and Intact Exudate Amount: Medium Exudate Type: Serosanguineous Exudate Color: red, brown res Foul Odor After Cleansing: No Slough/Fibrino Yes Wound Bed Granulation Amount: Large (67-100%) Exposed Structure Granulation Quality: Red Fascia Exposed: No Necrotic Amount: Small (1-33%) Fat Layer (Subcutaneous Tissue) Exposed: Yes Necrotic Quality: Adherent Slough Tendon Exposed: No Muscle Exposed: No Joint Exposed: No Bone Exposed: No Electronic Signature(s) Signed: 06/03/2020 2:53:32 PM By: [name] Canavan Entered By: [name] Canavan on 06/03/2020 08:37:28

## 2020-06-06 ENCOUNTER — Encounter: Payer: Medicare Other | Attending: Physician Assistant

## 2020-06-06 ENCOUNTER — Other Ambulatory Visit: Payer: Self-pay

## 2020-06-06 DIAGNOSIS — I890 Lymphedema, not elsewhere classified: Secondary | ICD-10-CM | POA: Insufficient documentation

## 2020-06-06 DIAGNOSIS — J45909 Unspecified asthma, uncomplicated: Secondary | ICD-10-CM | POA: Insufficient documentation

## 2020-06-06 DIAGNOSIS — L97221 Non-pressure chronic ulcer of left calf limited to breakdown of skin: Secondary | ICD-10-CM | POA: Diagnosis not present

## 2020-06-06 DIAGNOSIS — G4733 Obstructive sleep apnea (adult) (pediatric): Secondary | ICD-10-CM | POA: Insufficient documentation

## 2020-06-06 DIAGNOSIS — I10 Essential (primary) hypertension: Secondary | ICD-10-CM | POA: Diagnosis not present

## 2020-06-06 DIAGNOSIS — I82592 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: Secondary | ICD-10-CM | POA: Diagnosis not present

## 2020-06-06 DIAGNOSIS — I87332 Chronic venous hypertension (idiopathic) with ulcer and inflammation of left lower extremity: Secondary | ICD-10-CM | POA: Diagnosis not present

## 2020-06-06 NOTE — Progress Notes (Addendum)
ALICYN, KLANN ([id number]) Visit Report for 06/06/2020 Arrival Information Details Patient Name: [name], [name]. Date of Service: 06/06/2020 1:15 PM Medical Record Number: [id number] Patient Account Number: [id number] Date of Birth/Sex: October 15, 1946 (74 y.o. F) Treating RN: Cornell Barman Primary Care Quentin Strebel: [name] Other Clinician: Referring Gurtej Noyola: [name] Treating Emmry Hinsch/Extender: Melburn Hake, HOYT Weeks in Treatment: 56 Visit Information History Since Last Visit Has Dressing in Place as Prescribed: Yes Patient Arrived: Ambulatory Pain Present Now: No Arrival Time: 13:45 Accompanied By: self Transfer Assistance: None Patient Identification Verified: Yes Secondary Verification Process Completed: Yes Patient Requires Transmission-Based No Precautions: Patient Has Alerts: Yes Patient Alerts: Patient on Blood Thinner aspirin 81 Electronic Signature(s) Signed: 06/06/2020 5:52:03 PM By: [name], [name], RN, CWS, [name], [name] Entered By: [name], [name], RN, CWS, Kim on 06/06/2020 17:52:02 Reserve, [name] ([id number]) -------------------------------------------------------------------------------- Compression Therapy Details Patient Name: [name], [name]. Date of Service: 06/06/2020 1:15 PM Medical Record Number: [id number] Patient Account Number: [id number] Date of Birth/Sex: 01-30-1946 (74 y.o. F) Treating RN: Cornell Barman Primary Care Tateanna Bach: [name] Other Clinician: Referring Kamala Kolton: [name] Treating Reva Pinkley/Extender: Melburn Hake, HOYT Weeks in Treatment: 77 Compression Therapy Performed for Wound Assessment: Wound #5 Left,Medial Lower Leg Performed By: Clinician Cornell Barman, RN Compression Type: Four Layer Pre Treatment ABI: 1 Electronic Signature(s) Signed: 06/06/2020 5:52:39 PM By: [name], [name], RN, CWS, [name], [name] Entered By: [name], [name], RN, CWS, Kim on 06/06/2020 17:52:39 [name]  ([id number]) -------------------------------------------------------------------------------- Encounter Discharge Information Details Patient Name: [name], [name]. Date of Service: 06/06/2020 1:15 PM Medical Record Number: [id number] Patient Account Number: [id number] Date of Birth/Sex: Aug 20, 1946 (74 y.o. F) Treating RN: Cornell Barman Primary Care Jentzen Minasyan: [name] Other Clinician: Referring Shemaiah Round: [name] Treating Aliany Fiorenza/Extender: Melburn Hake, HOYT Weeks in Treatment: 35 Encounter Discharge Information Items Discharge Condition: Stable Ambulatory Status: Ambulatory Discharge Destination: Home Transportation: Private Auto Accompanied By: self Schedule Follow-up Appointment: Yes Clinical Summary of Care: Electronic Signature(s) Signed: 06/06/2020 5:53:49 PM By: [name], [name], RN, CWS, [name], [name] Entered By: [name], [name], RN, CWS, Kim on 06/06/2020 17:53:48 Burbano, [name] ([id number]) -------------------------------------------------------------------------------- Wound Assessment Details Patient Name: [name], [name]. Date of Service: 06/06/2020 1:15 PM Medical Record Number: [id number] Patient Account Number: [id number] Date of Birth/Sex: 12-15-1945 (74 y.o. F) Treating RN: Cornell Barman Primary Care Orville Mena: [name] Other Clinician: Referring Desta Bujak: [name] Treating Bernadine Melecio/Extender: Melburn Hake, HOYT Weeks in Treatment: 77 Wound Status Wound Number: 5 Primary Etiology: Lymphedema Wound Location: Left, Medial Lower Leg Wound Status: Open Wounding Event: Gradually Appeared Date Acquired: 11/19/2018 Weeks Of Treatment: 77 Clustered Wound: No Wound Measurements Length: (cm) 1.5 Width: (cm) 0.6 Depth: (cm) 0.1 Area: (cm) 0.707 Volume: (cm) 0.071 % Reduction in Area: 91.7% % Reduction in Volume: 91.7% Wound Description Classification: Full Thickness Without Exposed Support Structure s Treatment Notes Wound #5 (Left, Medial Lower  Leg) Notes SIlver Cell, ABD, 4 layer unna to anchor Electronic Signature(s) Signed: 06/07/2020 7:33:04 PM By: [name], [name], RN, CWS, [name], [name] Entered By: [name], [name], RN, CWS, Kim on 06/06/2020 17:52:14

## 2020-06-07 DIAGNOSIS — L821 Other seborrheic keratosis: Secondary | ICD-10-CM | POA: Diagnosis not present

## 2020-06-07 DIAGNOSIS — D2261 Melanocytic nevi of right upper limb, including shoulder: Secondary | ICD-10-CM | POA: Diagnosis not present

## 2020-06-07 DIAGNOSIS — L218 Other seborrheic dermatitis: Secondary | ICD-10-CM | POA: Diagnosis not present

## 2020-06-07 DIAGNOSIS — Z85828 Personal history of other malignant neoplasm of skin: Secondary | ICD-10-CM | POA: Diagnosis not present

## 2020-06-07 DIAGNOSIS — D225 Melanocytic nevi of trunk: Secondary | ICD-10-CM | POA: Diagnosis not present

## 2020-06-07 DIAGNOSIS — L920 Granuloma annulare: Secondary | ICD-10-CM | POA: Diagnosis not present

## 2020-06-07 DIAGNOSIS — Z85820 Personal history of malignant melanoma of skin: Secondary | ICD-10-CM | POA: Diagnosis not present

## 2020-06-07 DIAGNOSIS — D2262 Melanocytic nevi of left upper limb, including shoulder: Secondary | ICD-10-CM | POA: Diagnosis not present

## 2020-06-08 ENCOUNTER — Other Ambulatory Visit: Payer: Self-pay

## 2020-06-08 DIAGNOSIS — I890 Lymphedema, not elsewhere classified: Secondary | ICD-10-CM | POA: Diagnosis not present

## 2020-06-08 DIAGNOSIS — I87332 Chronic venous hypertension (idiopathic) with ulcer and inflammation of left lower extremity: Secondary | ICD-10-CM | POA: Diagnosis not present

## 2020-06-08 DIAGNOSIS — J45909 Unspecified asthma, uncomplicated: Secondary | ICD-10-CM | POA: Diagnosis not present

## 2020-06-08 DIAGNOSIS — L97221 Non-pressure chronic ulcer of left calf limited to breakdown of skin: Secondary | ICD-10-CM | POA: Diagnosis not present

## 2020-06-08 DIAGNOSIS — I82592 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: Secondary | ICD-10-CM | POA: Diagnosis not present

## 2020-06-08 DIAGNOSIS — I10 Essential (primary) hypertension: Secondary | ICD-10-CM | POA: Diagnosis not present

## 2020-06-10 ENCOUNTER — Other Ambulatory Visit: Payer: Self-pay

## 2020-06-10 DIAGNOSIS — I890 Lymphedema, not elsewhere classified: Secondary | ICD-10-CM | POA: Diagnosis not present

## 2020-06-10 DIAGNOSIS — I10 Essential (primary) hypertension: Secondary | ICD-10-CM | POA: Diagnosis not present

## 2020-06-10 DIAGNOSIS — I82592 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: Secondary | ICD-10-CM | POA: Diagnosis not present

## 2020-06-10 DIAGNOSIS — I87332 Chronic venous hypertension (idiopathic) with ulcer and inflammation of left lower extremity: Secondary | ICD-10-CM | POA: Diagnosis not present

## 2020-06-10 DIAGNOSIS — L97221 Non-pressure chronic ulcer of left calf limited to breakdown of skin: Secondary | ICD-10-CM | POA: Diagnosis not present

## 2020-06-10 DIAGNOSIS — J45909 Unspecified asthma, uncomplicated: Secondary | ICD-10-CM | POA: Diagnosis not present

## 2020-06-10 NOTE — Progress Notes (Signed)
HOKULANI, ROGEL ([id number]) Visit Report for 06/08/2020 Arrival Information Details Patient Name: [name], [name]. Date of Service: 06/08/2020 8:00 AM Medical Record Number: [id number] Patient Account Number: [id number] Date of Birth/Sex: 1946-08-28 (74 y.o. F) Treating RN: Army Melia Primary Care Trenton Verne: [name] Other Clinician: Referring Fantasy Donald: [name] Treating Valla [name]/Extender: Melburn Hake, HOYT Weeks in Treatment: 71 Visit Information History Since Last Visit Added or deleted any medications: No Patient Arrived: Ambulatory Any new allergies or adverse reactions: No Arrival Time: 08:09 Had a fall or experienced change in No Accompanied By: self activities of daily living that may affect Transfer Assistance: None risk of falls: Patient Identification Verified: Yes Signs or symptoms of abuse/neglect since last visito No Patient Requires Transmission-Based No Hospitalized since last visit: No Precautions: Has Dressing in Place as Prescribed: Yes Patient Has Alerts: Yes Pain Present Now: No Patient Alerts: Patient on Blood Thinner aspirin 81 Electronic Signature(s) Signed: 06/10/2020 11:33:15 AM By: Army Melia Entered By: Army Melia on 06/08/2020 08:09:25 Winters, [name] ([id number]) -------------------------------------------------------------------------------- Compression Therapy Details Patient Name: [name]. Date of Service: 06/08/2020 8:00 AM Medical Record Number: [id number] Patient Account Number: [id number] Date of Birth/Sex: 08-04-1946 (74 y.o. F) Treating RN: Army Melia Primary Care Tereso Unangst: [name] Other Clinician: Referring Traven Davids: [name] Treating Kamrin Sibley/Extender: STONE III, HOYT Weeks in Treatment: 78 Compression Therapy Performed for Wound Assessment: Wound #5 Left,Medial Lower Leg Performed By: Clinician Army Melia, RN Compression Type: Four Layer Electronic Signature(s) Signed: 06/10/2020 11:33:15  AM By: Army Melia Entered By: Army Melia on 06/08/2020 08:18:31 [name], [name] ([id number]) -------------------------------------------------------------------------------- Encounter Discharge Information Details Patient Name: [name]. Date of Service: 06/08/2020 8:00 AM Medical Record Number: [id number] Patient Account Number: [id number] Date of Birth/Sex: 1946-07-12 (74 y.o. F) Treating RN: Army Melia Primary Care Nedim Oki: [name] Other Clinician: Referring Analisia Kingsford: [name] Treating Meggan Dhaliwal/Extender: Melburn Hake, HOYT Weeks in Treatment: 52 Encounter Discharge Information Items Discharge Condition: Stable Ambulatory Status: Ambulatory Discharge Destination: Home Transportation: Private Auto Accompanied By: self Schedule Follow-up Appointment: Yes Clinical Summary of Care: Electronic Signature(s) Signed: 06/10/2020 11:33:15 AM By: Army Melia Entered By: Army Melia on 06/08/2020 08:19:21 Merry, [name] ([id number]) -------------------------------------------------------------------------------- Wound Assessment Details Patient Name: [name]. Date of Service: 06/08/2020 8:00 AM Medical Record Number: [id number] Patient Account Number: [id number] Date of Birth/Sex: 10-05-1946 (74 y.o. F) Treating RN: Army Melia Primary Care Surya Folden: [name] Other Clinician: Referring Scotty Pinder: [name] Treating Lamaya Hyneman/Extender: STONE III, HOYT Weeks in Treatment: 35 Wound Status Wound Number: 5 Primary Lymphedema Etiology: Wound Location: Left, Medial Lower Leg Wound Open Wounding Event: Gradually Appeared Status: Date Acquired: 11/19/2018 Comorbid Cataracts, Asthma, Sleep Apnea, Deep Vein Thrombosis, Weeks Of Treatment: 78 History: Hypertension, Peripheral Venous Disease, Osteoarthritis, Clustered Wound: No Received Chemotherapy, Received Radiation Photos Wound Measurements Length: (cm) 1.7 Width: (cm) 1 Depth: (cm)  0.1 Area: (cm) 1.335 Volume: (cm) 0.134 % Reduction in Area: 84.4% % Reduction in Volume: 84.3% Epithelialization: None Tunneling: No Undermining: No Wound Description Classification: Full Thickness Without Exposed Support Structu Exudate Amount: Medium Exudate Type: Serosanguineous Exudate Color: red, brown res Foul Odor After Cleansing: No Slough/Fibrino Yes Wound Bed Granulation Amount: Medium (34-66%) Exposed Structure Granulation Quality: Red Fascia Exposed: No Necrotic Amount: Medium (34-66%) Fat Layer (Subcutaneous Tissue) Exposed: Yes Necrotic Quality: Adherent Slough Tendon Exposed: No Muscle Exposed: No Joint Exposed: No Bone Exposed: No Treatment Notes Wound #5 (Left, Medial Lower Leg) Notes prisma, ABD, 4 layer unna to anchor Electronic Signature(s) Signed: 06/10/2020 11:33:15  AM By: Darletta Moll, [name] ([id number]) Entered By: Army Melia on 06/08/2020 08:18:09

## 2020-06-10 NOTE — Progress Notes (Signed)
Amber, [name] ([id number]) Visit Report for 06/10/2020 Arrival Information Details Patient Name: [name], [name]. Date of Service: 06/10/2020 11:45 AM Medical Record Number: [id number] Patient Account Number: [id number] Date of Birth/Sex: 1946-01-21 (74 y.o. F) Treating RN: Army Melia Primary Care Chrisoula Zegarra: [name] Other Clinician: Referring Megin Consalvo: [name] Treating Letetia Romanello/Extender: Melburn Hake, HOYT Weeks in Treatment: 48 Visit Information History Since Last Visit Added or deleted any medications: No Patient Arrived: Ambulatory Any new allergies or adverse reactions: No Arrival Time: 12:11 Had a fall or experienced change in No Accompanied By: self activities of daily living that may affect Transfer Assistance: None risk of falls: Patient Identification Verified: Yes Signs or symptoms of abuse/neglect since last visito No Patient Requires Transmission-Based No Hospitalized since last visit: No Precautions: Has Dressing in Place as Prescribed: Yes Patient Has Alerts: Yes Pain Present Now: No Patient Alerts: Patient on Blood Thinner aspirin 81 Electronic Signature(s) Signed: 06/10/2020 12:28:28 PM By: Army Melia Entered By: Army Melia on 06/10/2020 12:11:22 [place], [name] ([id number]) -------------------------------------------------------------------------------- Compression Therapy Details Patient Name: Amber, [name]. Date of Service: 06/10/2020 11:45 AM Medical Record Number: [id number] Patient Account Number: [id number] Date of Birth/Sex: [birth year] (74 y.o. F) Treating RN: Army Melia Primary Care Dafne Nield: [name] Other Clinician: Referring Shonta Bourque: [name] Treating Aury Scollard/Extender: STONE III, HOYT Weeks in Treatment: 78 Compression Therapy Performed for Wound Assessment: Wound #5 Left,Medial Lower Leg Performed By: Clinician Army Melia, RN Compression Type: Four Layer Electronic Signature(s) Signed: 06/10/2020  12:28:28 PM By: Army Melia Entered By: Army Melia on 06/10/2020 12:11:48 Loper, [name] ([id number]) -------------------------------------------------------------------------------- Encounter Discharge Information Details Patient Name: Amber, [name]. Date of Service: 06/10/2020 11:45 AM Medical Record Number: [id number] Patient Account Number: [id number] Date of Birth/Sex: 03-04-1946 (74 y.o. F) Treating RN: Army Melia Primary Care Mohmmad Saleeby: [name] Other Clinician: Referring Juliahna Wiswell: [name] Treating Mayrene Bastarache/Extender: Melburn Hake, HOYT Weeks in Treatment: 92 Encounter Discharge Information Items Discharge Condition: Stable Ambulatory Status: Ambulatory Discharge Destination: Home Transportation: Private Auto Accompanied By: self Schedule Follow-up Appointment: Yes Clinical Summary of Care: Electronic Signature(s) Signed: 06/10/2020 12:28:28 PM By: Army Melia Entered By: Army Melia on 06/10/2020 12:12:28 Golden, [name] ([id number]) -------------------------------------------------------------------------------- Wound Assessment Details Patient Name: Amber, [name]. Date of Service: 06/10/2020 11:45 AM Medical Record Number: [id number] Patient Account Number: [id number] Date of Birth/Sex: 25-Nov-1945 (74 y.o. F) Treating RN: Army Melia Primary Care Lilianna Case: [name] Other Clinician: Referring Archer Vise: [name] Treating Vertie Dibbern/Extender: STONE III, HOYT Weeks in Treatment: 78 Wound Status Wound Number: 5 Primary Etiology: Lymphedema Wound Location: Left, Medial Lower Leg Wound Status: Open Wounding Event: Gradually Appeared Date Acquired: 11/19/2018 Weeks Of Treatment: 78 Clustered Wound: No Wound Measurements Length: (cm) 1.7 Width: (cm) 1 Depth: (cm) 0.1 Area: (cm) 1.335 Volume: (cm) 0.134 % Reduction in Area: 84.4% % Reduction in Volume: 84.3% Wound Description Classification: Full Thickness Without Exposed Support  Structure s Treatment Notes Wound #5 (Left, Medial Lower Leg) Notes prisma, ABD, 4 layer unna to anchor Electronic Signature(s) Signed: 06/10/2020 12:28:28 PM By: Army Melia Entered By: Army Melia on 06/10/2020 12:11:31

## 2020-06-13 ENCOUNTER — Ambulatory Visit: Payer: 59

## 2020-06-15 ENCOUNTER — Encounter: Payer: Medicare Other | Admitting: Internal Medicine

## 2020-06-15 ENCOUNTER — Other Ambulatory Visit: Payer: Self-pay

## 2020-06-15 DIAGNOSIS — I87332 Chronic venous hypertension (idiopathic) with ulcer and inflammation of left lower extremity: Secondary | ICD-10-CM | POA: Diagnosis not present

## 2020-06-15 DIAGNOSIS — I872 Venous insufficiency (chronic) (peripheral): Secondary | ICD-10-CM | POA: Diagnosis not present

## 2020-06-15 DIAGNOSIS — L97221 Non-pressure chronic ulcer of left calf limited to breakdown of skin: Secondary | ICD-10-CM | POA: Diagnosis not present

## 2020-06-15 DIAGNOSIS — J45909 Unspecified asthma, uncomplicated: Secondary | ICD-10-CM | POA: Diagnosis not present

## 2020-06-15 DIAGNOSIS — I82592 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: Secondary | ICD-10-CM | POA: Diagnosis not present

## 2020-06-15 DIAGNOSIS — I10 Essential (primary) hypertension: Secondary | ICD-10-CM | POA: Diagnosis not present

## 2020-06-15 DIAGNOSIS — I890 Lymphedema, not elsewhere classified: Secondary | ICD-10-CM | POA: Diagnosis not present

## 2020-06-15 DIAGNOSIS — L97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d: Secondary | ICD-10-CM | POA: Diagnosis not present

## 2020-06-16 ENCOUNTER — Ambulatory Visit (INDEPENDENT_AMBULATORY_CARE_PROVIDER_SITE_OTHER): Payer: Medicare Other | Admitting: Podiatry

## 2020-06-16 ENCOUNTER — Other Ambulatory Visit: Payer: Self-pay

## 2020-06-16 ENCOUNTER — Encounter: Payer: Self-pay | Admitting: Podiatry

## 2020-06-16 DIAGNOSIS — B351 Tinea unguium: Secondary | ICD-10-CM

## 2020-06-16 DIAGNOSIS — M79675 Pain in left toe(s): Secondary | ICD-10-CM

## 2020-06-16 DIAGNOSIS — I872 Venous insufficiency (chronic) (peripheral): Secondary | ICD-10-CM | POA: Diagnosis not present

## 2020-06-16 DIAGNOSIS — M79674 Pain in right toe(s): Secondary | ICD-10-CM | POA: Diagnosis not present

## 2020-06-16 NOTE — Progress Notes (Signed)
This patient returns to my office for at risk foot care.  This patient requires this care by a professional since this patient will be at risk due to having chronic venous insufficiency.  Patient is wearing an unna boot and is under care from the wound center left  leg.  Wearing compression sock right leg..  Patient is taking trental.This patient is unable to cut nails herself  since the patient cannot reach her  nails.These nails are painful walking and wearing shoes.  This patient presents for at risk foot care today.  General Appearance  Alert, conversant and in no acute stress.  Vascular  Deferred  Neurologic  Deferred  Nails Thick disfigured discolored nails with subungual debris  from hallux to fifth toes bilaterally. No evidence of bacterial infection or drainage bilaterally. Pincer hallux nails  B/L.  Orthopedic  No limitations of motion  feet .  No crepitus or effusions noted.  No bony pathology or digital deformities noted. DJD [name]  B/L.  Skin  normotropic skin with no porokeratosis noted bilaterally.  No signs of infections or ulcers noted.   Unknown swelling dorsum of right foot.  Onychomycosis  Pain in right toes  Pain in left toes  Consent was obtained for treatment procedures.   Mechanical debridement of nails 1-5  bilaterally performed with a nail nipper.  Filed with dremel without incident. No infection or ulcer.     Return office visit   10  weeks        Told patient to return for periodic foot care and evaluation due to potential at risk complications.   Gardiner Barefoot DPM

## 2020-06-16 NOTE — Progress Notes (Signed)
SHAELIN, LALLEY ([id number]) Visit Report for 06/15/2020 HPI Details Patient Name: [name], [name]. Date of Service: 06/15/2020 12:30 PM Medical Record Number: [id number] Patient Account Number: [id number] Date of Birth/Sex: 1946-10-20 (74 y.o. F) Treating RN: Cornell Barman Primary Care Provider: [name] Other Clinician: Referring Provider: [name] Treating Provider/Extender: Beverly Gust in Treatment: 33 History of Present Illness HPI Description: Pleasant 74 year old with history of chronic venous insufficiency. No diabetes or peripheral vascular disease. Left ABI 1.29. Questionable history of left lower extremity DVT. She developed a recurrent ulceration on her left lateral calf in December 2015, which she attributes to poor diet and subsequent lower extremity edema. She underwent endovenous laser ablation of her left greater saphenous vein in 2010. She underwent laser ablation of accessory branch of left GSV in April 2016 by Dr. Kellie Simmering at [facility]. She was previously wearing Unna boots, which she tolerated well. Tolerating 2 layer compression and cadexomer iodine. She returns to clinic for follow-up and is without new complaints. She denies any significant pain at this time. She reports persistent pain with pressure. No claudication or ischemic rest pain. No fever or chills. No drainage. READMISSION 11/13/16; this is a 74 year old woman who is not a diabetic. She is here for a review of a painful area on her left medial lower extremity. I note that she was seen here previously last year for wound I believe to be in the same area. At that time she had undergone previously a left greater saphenous vein ablation by Dr. Kellie Simmering and she had a ablation of the anterior accessory branch of the left greater saphenous vein in March 2016. Seeing that the wound actually closed over. In reviewing the history with her today the ulcer in this area has been recurrent. She describes  a biopsy of this area in 2009 that only showed stasis physiology. 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 Do not see that she has ever had arterial studies in the left leg. She tells me she has a follow-up with Dr. Kellie Simmering in roughly 10 days In any case she developed the reopening of this area roughly a month ago. On the background of this she describes rapidly increasing edema which has responded to Lasix 40 mg and metolazone 2.5 mg as well as the patient's lymph massage. She has been told she has both venous insufficiency and lymphedema but she cannot tolerate compression stockings 11/28/16; the patient saw Dr. Kellie Simmering recently. Per the patient he did arterial Dopplers in the office that did not show evidence of arterial insufficiency, per the patient he stated "treat this like an ordinary venous ulcer". She also saw her dermatologist Dr. Ronnald Ramp who felt that this was more of a vascular ulcer. In general things are improving although she arrives today with increasing bilateral lower extremity edema with weeping a deeper fluid through the wound on the left medial leg compatible with some degree of lymphedema 12/04/16; the patient's wound is fully epithelialized but I don't think fully healed. We will do another week of depression with Promogran and TCA however I suspect we'll be able to discharge her next week. This is a very unusual-looking wound which was initially a figure-of-eight type wound lying on its side surrounded by petechial like hemorrhage. She has  had venous ablation on this side. She apparently does not have an arterial issue per Dr. Kellie Simmering. She saw her dermatologist thought it was "vascular". Patient is definitely going to need ongoing compression and I talked about  this with her today she will go to elastic therapy after she leaves here next week 12/11/16; the patient's wound is not completely closed today. She has surrounding scar tissue and in further discussion with the patient it would appear that she had ulcers in this area in 2009 for a prolonged period of time ultimately requiring a punch biopsy of this area that only showed venous insufficiency. I did not previously pickup on this part of the history from the patient. 12/18/16; the patient's wound is completely epithelialized. There is no open area here. She has significant bilateral venous insufficiency with secondary lymphedema to a mild-to-moderate degree she does not have compression stockings.. She did not say anything to me when I was in the room, she told our intake nurse that she was still having pain in this area. This isn't unusual recurrent small open area. She is going to go to elastic therapy to obtain compression stockings. 12/25/16; the patient's wound is fully epithelialized. There is no open area here. The patient describes some continued episodic discomfort in this area medial left calf. However everything looks fine and healed here. She is been to elastic therapy and caught herself 15-20 mmHg stockings, they apparently were having trouble getting 20-30 mm stockings in her size 01/22/17; this is a patient we discharged from the clinic a month ago. She has a recurrent open wound on her medial left calf. She had 15 mm support stockings. I told her I thought she needed 20-30 mm compression stockings. She tells me that she has been ill with hospitalization secondary to asthma and is been found to have severe hypokalemia likely secondary to a combination of Lasix and metolazone. This morning she noted blistering and leaking fluid on the posterior part of her left leg. She called our intake nurse urgently and we was saw her this afternoon. She has not had any real discomfort here. I don't know  that she's been wearing any stockings on this leg for at least 2-3 days. ABIs in this clinic were 1.21 on the right and 1.3 on the left. She is previously seen vascular surgery who does not think that there is a peripheral arterial issue. 01/30/17; Patient arrives with no open wound on the left leg. She has been to elastic therapy and obtained 20-78mmhg below knee stockings and she has one on the right leg today. READMISSION 02/19/18; this Magnussen is a now 74 year old patient we've had in this clinic perhaps 3 times before. I had last looked at her from January 07 December 2016 with an area on the medial left leg. We discharged her on 12/25/16 however she had to be readmitted on 01/22/17 with a recurrence. I have in my notes that we discharged her on 20-30 mm stockings although she tells me she was only wearing support hose because she cannot get stockings on predominantly related to her cervical spine surgery/issues. She has had previous ablations done by vein and vascular in [place] including a great saphenous vein ablation on the left with an anterior accessory branch ablation I think both of these were in 2016. On one of the previous visit she had a biopsy noted 2009 that was negative. She is not felt to have an arterial issue. She is not a diabetic. She does have  a history of obstructive sleep apnea hypertension asthma as well as chronic venous insufficiency and lymphedema. On this occasion she noted 2 dry scaly patch on her left leg. She tried to put lotion on this it didn't really help. There were 2 open areas.the CHEVELLA, PEARCE ([id number]) patient has been seeing her primary physician from 02/05/18 through 02/14/18. She had Unna boots applied. The superior wound now on the lateral left leg has closed but she's had one wound that remains open on the lateral left leg. This is not the same spot as we dealt with in 2018. ABIs in this clinic were 1.3 bilaterally 02/26/18; patient has a small wound on  the left lateral calf. Dimensions are down. She has chronic venous insufficiency and lymphedema. 03/05/18; small open area on the left lateral calf. Dimensions are down. Tightly adherent necrotic debris over the surface of the wound which was difficult to remove. Also the dressing [over collagen] stuck to the wound surface. This was removed with some difficulty as well. Change the primary dressing to Hydrofera Blue ready 03/12/18; small open area on the left lateral calf. Comes in with tightly adherent surface eschar as well as some adherent Hydrofera Blue. 03/19/18; open area on the left lateral calf. Again adherent surface eschar as well as some adherent Hydrofera Blue nonviable subcutaneous tissue. She complained of pain all week even with the reduction from 4-3 layer compression I put on last week. Also she had an increase in her ankle and calf measurements probably related to the same thing. 03/26/18; open area on the left lateral calf. A very small open area remains here. We used silver alginate starting last week as the Hydrofera Blue seem to stick to the wound bed. In using 4-layer compression 04/02/18; the open area in the left lateral calf at some adherent slough which I removed there is no open area here. We are able to transition her into her own compression stocking. Truthfully I think this is probably his support hose. However this does not maintain skin integrity will be limited. She cannot put over the toe compression stockings on because of neck problems hand problems etc. She is allergic to the lining layer of juxta lites. We might be forced to use extremitease stocking should this fail READMIT 11/24/2018 Patient is now a 74 year old woman who is not a diabetic. She has been in this clinic on at least 3 previous occasions largely with recurrent wounds on her left leg secondary to chronic venous insufficiency with secondary lymphedema. Her situation is complicated by inability to  get stockings on and an allergy to neoprene which is apparently a component and at least juxta lites and other stockings. As a result she really has not been wearing any stockings on her legs. She tells [place] that roughly 2 or 3 weeks ago she started noticing a stinging sensation just above her ankle on the left medial aspect. She has been diagnosed with pseudogout and she wondered whether this was what she was experiencing. She tried to dress this with something she bought at the store however subsequently it pulled skin off and now she has an open wound that is not improving. She has been using Vaseline gauze with a cover bandage. She saw her primary doctor last week who put an Haematologist on her. ABIs in this clinic was 1.03 on the left 2/12; the area is on the left medial ankle. Odd-looking wound with what looks to be surface epithelialization but a multitude of small  petechial openings. This clearly not closed yet. We have been using silver alginate under 3 layer compression with TCA 2/19; the wound area did not look quite as good this week. Necrotic debris over the majority of the wound surface which required debridement. She continues to have a multitude of what looked to be small petechial openings. She reminds [place] that she had a biopsy on this initially during her first outbreak in 2015 in [place] [facility]. She expresses concern about this being a possible melanoma. 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 I assured her that this does not look anything like melanoma. Except for the petechial reaction it does look like a venous insufficiency area and she certainly has evidence of this on both sides 2/26; a difficult area on the left medial ankle. The patient clearly has chronic venous hypertension with some degree of lymphedema. The odd thing about the area is the small petechial hemorrhages. I am not  really sure how to explain this. This was present last time and this is not a compression injury. We have been using Hydrofera Blue which I changed to last week 3/4; still using Hydrofera Blue. Aggressive debridement today. She does not have known arterial issues. She has seen Dr. Kellie Simmering at [facility] vein and vascular and and has an ablation on the left. [Anterior accessory branch of the greater saphenous]. From what I remember they did not feel she had an arterial issue. The patient has had this area biopsied in 2009 at [facility] dermatology and by her recollection they said this was "stasis". She is also follow-up with dermatology locally who thought that this was more of a vascular issue 3/11; using Hydrofera Blue. Aggressive debridement today. She does not have an arterial issue. We are using 3 layer compression although we may need to go to 4. The patient has been in for multiple changes to her wrap since I last saw her a week ago. She says that the area was leaking. I do not have too much more information on what was found 01/19/19 on evaluation today patient was actually being seen for a nurse visit when unfortunately she had the area on her left lateral lower extremity as well as weeping from the right lower extremity that became apparent. Therefore we did end up actually seeing her for a full visit with myself. She is having some pain at this site as well but fortunately nothing too significant at this point. No fevers, chills, nausea, or vomiting noted at this time. 3/18-Patient is back to the clinic with the left leg venous leg ulcer, the ulcer is larger in size, has a surface that is densely adherent with fibrinous tissue, the Hydrofera Blue was used but is densely adherent and there was difficulty in removing it. The right lower extremity was also wrapped for weeping edema. Patient has a new area over the left lateral foot above the malleolus that is small and appears to have no debris  with intact surrounding skin. Patient is on increased dose of Lasix also as a means to edema management 3/25; the patient has a nonhealing venous ulcer on the medial left leg and last week developed a smaller area on the lateral left calf. We have been using Hydrofera Blue with a contact layer. 4/1; no major change in these wounds areas. Left medial and more recently left lateral calf. I tried Iodoflex last week to aid in debridement she  did not tolerate this. She stated her pain was terrible all week. She took the top layer of the 4 layer compression off. 4/8; the patient actually looks somewhat better in terms of her more prominent left lateral calf wound. There is some healthy looking tissue here. She is still complaining of a lot of discomfort. 4/15; patient in a lot of pain secondary to sciatica. She is on a prednisone taper prescribed by her primary physician. She has the 2 areas one on the left medial and more recently a smaller area on the left lateral calf. Both of these just above the malleoli 4/22; her back pain is better but she still states she is very uncomfortable and now feels she is intolerant to the [employer]. No real change in the wounds we have been using Sorbact. She has been previously intolerant to Iodoflex. There is not a lot of option about what we can use to debride this wound under compression that she no doubt needs. sHe states Ultram no longer works for her pain 4/29; no major change in the wounds slightly increased depth. Surface on the original medial wound perhaps somewhat improved however the more recent area on the lateral left ankle is 100% covered in very adherent debris we have been using Sorbact. She tolerates 4 layer compression well and her edema control is a lot better. She has not had to come in for a nurse check 5/6; no major change in the condition of the wounds. She did consent to debridement today which was done with some difficulty. Continuing Sorbact.  She did not tolerate Iodoflex. She was in for a check of her compression the day after we wrapped her last week this was adjusted but nothing much was found 5/13; no major change in the condition or area of the wounds. I was able to get a fairly aggressive debridement done on the lateral left leg wound. Even using Sorbact under compression. She came back in on Friday to have the wrap changed. She says she felt uncomfortable on the MATTHEW, PAIS. ([id number]) lateral aspect of her ankle. She has a long history of chronic venous insufficiency including previous ablation surgery on this side. 5/20-Patient returns for wounds on left leg with both wounds covered in slough, with the lateral leg wound larger in size, she has been in 3 layer compression and felt more comfortable, she describes pain in ankle, in leg and pins and needles in foot, and is about to try Pamelor for this 6/3; wounds on the left lateral and left medial leg. The area medially which is the most recent of the 2 seems to have had the largest increase in dimensions. We have been using Sorbac to try and debride the surface. She has been to see orthopedics they apparently did a plain x-ray that was indeterminant. Diagnosed her with neuropathy and they have ordered an MRI to determine if there is underlying osteomyelitis. This was not high on my thought list but I suppose it is prudent. We have advised her to make an appointment with vein and vascular in [place]. She has a history of a left greater saphenous and accessory vein ablations I wonder if there is anything else that can be done from a surgical point of view to help in these difficult refractory wounds. We have previously healed this wound on one occasion but it keeps on reopening [medial side] 6/10; deep tissue culture I did last week I think on the left medial wound showed both  moderate E. coli and moderate staph aureus [MSSA]. She is going to require antibiotics and I have  chosen Augmentin. We have been using Sorbact and we have made better looking wound surface on both sides but certainly no improvement in wound area. She was back in last Friday apparently for a dressing changes the wrap was hurting her outer left ankle. She has not managed to get a hold of vein and vascular in [place]. We are going to have to make her that appointment 6/17; patient is tolerating the Augmentin. She had an MRI that I think was ordered by orthopedic surgeon this did not show osteomyelitis or an abscess did suggest cellulitis. We have been using Sorbact to the lateral and medial ankles. We have been trying to arrange a follow-up appointment with vein and vascular in [place] or did her original ablations. We apparently an area sent the request to vein and vascular in [facility] 6/24; patient has completed the Augmentin. We do not yet have a vein and vascular appointment in [place]. I am not sure what the issue is here we have asked her to call tomorrow. We are using Sorbact. Making some improvements and especially the medial wound. Both surfaces however look better medial and lateral. 7/1; the patient has been in contact with vein and vascular in [place] but has not yet received an appointment. Using Sorbact we have gradually improve the wound surface with no improvement in surface area. She is approved for Apligraf but the wound surface still is not completely viable. She has not had to come in for a dressing change 7/8; the patient has an appointment with vein and vascular on 7/31 which is a Friday afternoon. She is concerned about getting back here for [place] to dress her wounds. I think it is important to have them goal for her venous reflux/history of ablations etc. to see if anything else can be done. She apparently tested positive for 1 of the blood tests with regards to lupus and saw a rheumatologist. He has raised the issue of vasculitis again. I have had this thought in  the past however the evidence seems overwhelming that this is a venous reflux etiology. If the rheumatologist tells me there is clinical and laboratory investigation is positive for lupus I will rethink this. 7/15; the patient's wound surfaces are quite a bit better. The medial area which was her original wound now has no depth although the lateral wound which was the more recent area actually appears larger. Both with viable surfaces which is indeed better. Using Sorbact. I wanted to use Apligraf on her however there is the issue of the vein and vascular appointment on 7/31 at 2:00 in the afternoon which would not allow her to get back to be rewrapped and they would no doubt remove the graft 7/22; the patient's wound surfaces have moderate amount of debris although generally look better. The lateral one is larger with 2 small satellite areas superiorly. We are waiting for her vein and vascular appointment on 7/31. She has been approved for Apligraf which I would like to use after th 7/29; wound surfaces have improved no debridement is required we have been using Sorbact. She sees vein and vascular on Friday with this so question of whether anything can be done to lessen the likelihood of recurrence and/or speed the healing of these areas. She is already had previous ablations. She no doubt has severe venous hypertension 8/5-Patient returns at 1 week, she was in Haematologist for  3 days by her podiatrist, we have been using so backed to the wound, she has increased pain in both the wounds on the left lower leg especially the more distal one on the lateral aspect 8/12-Patient returns at 1 week and she is agreeable to having debridement in both wounds on her left leg today. We have been using Sorbact, and vascular studies were reviewed at last visit 8/19; the patient arrives with her wounds fairly clean and no debridement is required. We have used Sorbact which is really done a nice job in cleaning up  these very difficult wound surfaces. The patient saw Dr. Donzetta Matters of vascular surgery on 7/31. He did not feel that there was an arterial component. He felt that her treated greater saphenous vein is adequately addressed and that the small saphenous vein did not appear to be involved significantly. She was also noted to have deep venous reflux which is not treatable. Dr. Donzetta Matters mentioned the possibility of a central obstructive component leading to reflux and he offered her central venography. She wanted to discuss this or think about it. I have urged her to go ahead with this. She has had recurrent difficult wounds in these areas which do heal but after months in the clinic. If there is anything that can be done to reduce the likelihood of this I think it is worth it. 9/2 she is still working towards getting follow-up with Dr. Donzetta Matters to schedule her CT. Things are quite a bit worse venography. I put Apligraf on 2 weeks ago on both wounds on the medial and lateral part of her left lower leg. She arrives in clinic today with 3 superficial additional wounds above the area laterally and one below the wound medially. She describes a lot of discomfort. I think these are probably wrapped injuries. Does not look like she has cellulitis. 07/20/2019 on evaluation today patient appears to be doing somewhat poorly in regard to her lower extremity ulcers. She in fact showed signs of erythema in fact we may even be dealing with an infection at this time. Unfortunately I am unsure if this is just infection or if indeed there may be some allergic reaction that occurred as a result of the Apligraf application. With that being said that would be unusual but nonetheless not impossible in this patient is one who is unfortunately allergic to quite a bit. Currently we have been using the Sorbact which seems to do as well as anything for her. I do think we may want to obtain a culture today to see if there is anything showing up  there that may need to be addressed. 9/16; noted that last week the wounds look worse in 1 week follow-up of the Apligraf. Using Sorbact as of 2 days ago. She arrives with copious amounts of drainage and new skin breakdown on the back of the left calf. The wounds arm more substantial bilaterally. There is a fair amount of swelling in the left calf no overt DVT there is edema present I think in the left greater than right thigh. She is supposed to go on 9/28 for CT venography. The wounds on the medial and lateral calf are worse and she has new skin breakdown posteriorly at least new for me. This is almost developing into a circumferential wound area The Apligraf was taken off last week which I agree with things are not going in the right direction a culture was done we do not have that back yet. She is on  Augmentin that she started 2 days ago 9/23; dressing was changed by her nurses on Monday. In general there is no improvement in the wound areas although the area looks less angry than last week. She did get Augmentin for MSSA cultured on the 14th. She still appears to have too much swelling in the left leg even with 3 layer compression 9/30; the patient underwent her procedure on 9/28 by Dr. Donzetta Matters at [facility]. She was discovered to have the common iliac vein measuring 12.2 mm but at the level of L4-L5 measured 3 mm. After stenting it measured 10 mm. 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 Rouleaux flow in the common femoral and femoral vein was observed much improved after stenting. We are using silver alginate to the wounds on the medial and lateral ankle on the left. 4 layer compression 10/7; the patient had fluid swelling around her knee and 4 layer compression. At the advice of vein and vascular this was reduced to 3 layer which she is tolerating better. We have been using silver alginate under 3 layer compression since last Friday 10/14; arrives with the  areas on the left ankle looking a lot better. Inflammation in the area also a lot better. She came in for a nurse check on SALLEE, HOGREFE ([id number]) 10/9 10/21; continued nice improvement. Slight improvements in surface area of both the medial and lateral wounds on the left. A lot of the satellite lesions in the weeping erythema around these from stasis dermatitis is resolved. We have been using silver alginate 10/28; general improvement in the entire wound areas although not a lot of change in dimensions the wound certainly looks better. There is a lot less in terms of venous inflammation. Continue silver alginate this week however look towards Hydrofera Blue next week 11/4; very adherent debris on the medial wound left wound is not as bad. We have been using silver alginate. Change to [facility] today 11/11; very adherent debris on both wound areas. She went to vein and vascular last week and follow-up they put in [place] boot on this today. He says the [facility] was adherent. Wound is definitely not as good as last week. Especially on the left there the satellite lesions look more prominent 11/18; absolutely no better. erythema on lateral aspect with tenderness. 09/30/2019 on evaluation today patient appears to actually be doing better. Dr. Dellia Nims did put her on doxycycline last week which I do believe has helped her at this point. Fortunately there is no signs of active infection at this time. No fevers, chills, nausea, vomiting, or diarrhea. I do believe he may want extend the doxycycline for 7 additional days just to ensure everything does completely cleared up the patient is in agreement with that plan. Otherwise she is going require some sharp debridement today 12/2; patient is completing a 2-week course of doxycycline. I gave her this empirically for inflammation as well as infection when I last saw her 2 weeks ago. All of this seems to be better. She is using silver alginate she  has the area on the medial aspect of the larger area laterally and the 2 small satellite regions laterally above the major wound. 12/9; the patient's wound on the left medial and left lateral calf look really quite good. We have been using silver alginate. She saw vein and vascular in follow-up on 10/09/2019. She has had a previous left greater saphenous vein ablation by Dr. Oscar La in 2016. More  recently she underwent a left common iliac vein stent by Dr. 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 The swelling is improved and certainly the wounds have improved. The patient shows [place] today area on the right medial calf there is almost no wound but leaking lymphedema. She says she start this started 3 or 4 days ago. She did not traumatize it. It is not painful. She does not wear compression on that side 12/16; the patient continues to do well laterally. Medially still requiring debridement. The area on the right calf did not materialize to anything and is not currently open. We wrapped this last time. She has support stockings for that leg although I am not sure they are going to provide adequate compression 12/23; the lateral wound looks stable. Medially still requiring debridement for tightly adherent fibrinous debris. We've been using silver alginate. Surface area not any different 12/30; neither wound is any better with regards to surface and the area on the left lateral is larger. I been using silver alginate to the left lateral which look quite good last week and Sorbact to the left medial 11/11/2019. Lateral wound area actually looks better and somewhat smaller. Medial still requires a very aggressive debridement today. We have been using Sorbact on both wound areas 1/13; not much better still adherent debris bilaterally. I been using Sorbact. She has severe venous hypertension. Probably some degree of dermal fibrosis distally. I wonder whether tighter compression might help and I am going  to try that today. We also need to work on the bioburden 1/20; using Sorbact. She has severe venous hypertension status post stent placement for pelvic vein compression. We applied gentamicin last time to see if we could reduce bioburden I had some discussion with her today about the use of pentoxifylline. This is occasionally used in this setting for wounds with refractory venous insufficiency. However this interacts with Plavix. She tells me that she was put on this after stent placement for 3 months. She will call Dr. Claretha Cooper office to discuss 1/27; we are using gentamicin under Sorbact. 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 She has a stent. Wound medially is measuring smaller this week. Laterally measuring slightly larger although she has some satellite lesions superiorly 2/3; gentamicin under Sorbact under 4-layer compression. 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 She has a stent on Plavix. Her wounds are measuring smaller this week. More substantially laterally where there is a satellite lesion superiorly. 2/10; gentamicin under Sorbac. 4-layer compression. Patient communicated with Dr. Donzetta Matters at vein and vascular in [place]. He is okay with the patient coming off Plavix I will therefore start her on pentoxifylline for a 1 month trial. In general her wounds look better today. I had some concerns about swelling in the left thigh however she measures 61.5 on the right and 63 on the mid thigh which does not suggest there is any difficulty. The patient is not describing any pain. 2/17; gentamicin under Sorbac 4-layer compression. She has been on pentoxifylline for 1 week and complains of loose stool. No nausea she is eating and drinking well 2/24; the patient apparently came in 2 days ago for a nurse visit when her wrap fell down. Both areas look a little worse this week macerated medially and satellite lesions laterally. Change to  silver alginate today 3/3; wounds are larger today especially medially. She also has more swelling in her foot lower leg and I even noted some swelling in her  posterior thigh which is tender. I wonder about the patency of her stent. Fortuitously she sees Dr. Claretha Cooper group on Friday 3/10; Mrs. Delange was seen by vein and vascular on 3/5. The patient underwent ultrasound. There was no evidence of thrombosis involving the IVC no evidence of thrombosis involving the right common iliac vein there is no evidence of thrombosis involving the right external iliac vein the left external vein is also patent. The right common iliac vein stent appears patent bilateral common femoral veins are compressible and appear patent. I was concerned about the left common iliac stent however it looks like this is functional. She has some edema in the posterior thigh that was tender she still has that this week. I also note they had trouble finding the pulses in her left foot and booked her for an ABI baseline in 4 weeks. She will follow up in 6 months for repeat IVC duplex. The patient stopped the pentoxifylline because of diarrhea. It does not look like that was being effective in any case. I have advised her to go back on her aspirin 81 mg tablet, vascular it also suggested this 3/17; comes in today with her wound surfaces a lot better. The excoriations from last week considerably better probably secondary to the TCA. We have been using silver alginate 3/24; comes in today with smaller wounds both medially and laterally. Both required debridement. There are 2 small satellite areas superiorly laterally. She also has a very odd bandlike area in the mid calf almost looking like there was a weakness in the wrap in a localized area. I would write this off as being this however anteriorly she has a small raised ballotable area that is very tender almost reminiscent of an abscess but there was no obvious purulent surface to  it. 02/04/20 upon evaluation today patient appears to be doing fairly well in regard to her wounds today. Fortunately there is no signs of active DEANNA, WIATER. ([id number]) infection at this time. No fevers, chills, nausea, vomiting, or diarrhea. She has been tolerating the dressing changes without complication. Fortunately I feel like she is showing signs of improvement although has been sometime since have seen her. Nonetheless the area of concern that Dr. Dellia Nims had last week where she had possibly an area of the wrap that was we can allow the leg to bulge appears to be doing significantly better today there is no signs of anything worsening. 4/7; the patient's wounds on her medial and lateral left leg continue to contract. We have been using a regular alginate. Last week she developed an area on the right medial lower leg which is probably a venous ulcer as well. 4/14; the wounds on her left medial and lateral lower leg continue to contract. Surface eschar. We have been using regular alginate. The area on the right medial lower leg is closed. We have been putting both legs under 4-layer contraction. The patient went back to see vein and vascular she had arterial studies done which were apparently "quite good" per the patient although I have not read their notes I have never felt she had an arterial issue. 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 This is been longstanding and refractory to exercise, leg elevation and longstanding use of compression wraps in our clinic as well as compression stockings on the times we have been able to get these to heal 4/21; we thought she actually might be close this week however she arrives in clinic  with a lot of edema in her upper left calf and into her posterior thigh. This is been an intermittent problem here. She says the wrap fell down but it was replaced with a nurse visit on Monday. We are using calcium  alginate to the wounds and the wound sizes there not terribly larger than last week but there is a lot more edema 4/28; again wound edges are smaller on both sides. Her edema is better controlled than last time. She is obtained her compression pumps from [employer] although they have not been to her home to set these up. 5/5; left medial and left lateral both look stable. I am not sure the medial is any smaller. We have been using calcium alginate under 4-layer compression. oShe had an area on the right medial. This was eschared today. We have been wrapping this as well. She does not tolerate external compression stockings due to a history of various contact allergies. She has her compression pumps however the representative from the company is coming on her to show her how to use these tomorrow 5/19; patient with severe chronic venous insufficiency secondary to central venous disease. She had a stent placed in her left common iliac vein. She has done better since but still difficult to control wounds. She comes in today with nothing open on the right leg. Her areas on the left medial and left lateral are just about closed. We are using calcium alginate under 4-layer compression. She is using her external compression pumps at home She only has 15-20 support stockings. States she cannot get anything tighter than that on. 03/30/20-Patient returns at 1 week, the wounds on the left leg are both slightly bigger, the last week she was on 3 layer compression which started to slide down. She is starting to use her lymphedema pumps although she stated on 1 day her right ankle started to swell up and she have to stop that day. Unfortunately the open area seem to oscillate between improving to the point of healing and then flaring up all to do with effectiveness of compression or lack of due to the left leg topography not keeping the compression wraps from rolling down 6/2 patient comes in with a 15/20  mmHg stocking on the right leg. She tells me that she developed a lot of swelling in her ankles she saw orthopedics she was felt to possibly be having a flare of pseudogout versus some other type of arthritis. She was put on steroids for a respiratory issue so that helps with the inflammation. She has not been using the pumps all week. She thinks the left thigh is more swollen than usual and I would agree with that. She has an appointment with Dr. Donzetta Matters 9 days or so from now 6/9; both wounds on the left medial and left lateral are smaller. We have been using calcium alginate under compression. She does not have an open wound on the right leg she is using a stocking and her compression pumps things are going well. She has an appointment with Dr. Donzetta Matters with regards to her stent in the left common iliac vein 6/16; the wounds on the left medial and left lateral ankle continues to contract. The patient saw Dr. Donzetta Matters and I think he seems satisfied. Ordered follow-up venous reflux studies on both sides in September. Cautioned that she may need thigh-high stockings. She has been using calcium alginate under compression on the left and her own stocking on the right leg. She  tells [place] there are no open wounds on the right 6/23; left lateral is just about closed. Medial required debridement today. We have been using calcium alginate. Extensive discussion about the compression pumps she is only using these on 25 mmHg states she could not take 40 or 30 when the wrap came out to her home to demonstrate these. He said they should not feel tight 6/30; the left lateral wound has a slight amount of eschar. . The area medially is about the same using Hydrofera Blue. 7/7; left lateral wound still has some eschar. I will remove this next week may be closed. The area medially is very small using Hydrofera Blue with improvement. Unfortunately the stockings fell down. Unfortunately the blisters have developed at the edge of  where the wrap fell. When this happened she says her legs hurt she did not use her pumps. We are not open Monday for her to come in and change the wraps and she had an appointment yesterday. She also tells me that she is going to have an MRI of her back. She is having pain radiating into her left anterior leg she thinks her from an L5 disc. She saw Dr. Ellene Route of neurosurgery 7/14; the area on the left lateral ankle area is closed. Still a small area medially however it looks better as well. We have been using Hydrofera Blue under 4-layer compression 7/21; left lateral ankle is still closed however her wound on the medial left calf is actually larger. This is probably because Hydrofera Blue got stuck to the wound. She came in for a nurse change on Friday and will do that again this week I was concerned about the amount of swelling that she had last week however she is using her compression pumps twice a day and the swelling seems well controlled 7/28; remaining wound on the left medial lower leg is smaller. We have been using moistened silver collagen under compression she is coming back for a nurse visit. For reasons that were not really clear she was just keeping her legs elevated and not using her compression pumps. I have asked her to use the compression pumps. She does not have any wounds on the right leg 06/15/20-Patient returns at 2 weeks, her LLE edema is worse and she developed a blister wound that is new and has bigger posterior calf wound on right, we are using Prisma with pad, 4 layer compression. she has been on lasix 40 mg daily Electronic Signature(s) Signed: 06/15/2020 1:50:51 PM By: Tobi Bastos MD, MBA Entered By: Tobi Bastos on 06/15/2020 13:50:51 ADONNA, HORSLEY ([id number]) -------------------------------------------------------------------------------- Physical Exam Details Patient Name: LATINA, FRANK. Date of Service: 06/15/2020 12:30 PM Medical Record Number:  [id number] Patient Account Number: [id number] Date of Birth/Sex: 1946-06-30 (74 y.o. F) Treating RN: Cornell Barman Primary Care Provider: [name] Other Clinician: Referring Provider: [name] Treating Provider/Extender: Beverly Gust in Treatment: 79 Constitutional alert and oriented x 3. sitting or standing blood pressure is within target range for patient.. supine blood pressure is within target range for patient.. pulse regular and within target range for patient.Marland Kitchen respirations regular, non-labored and within target range for patient.Marland Kitchen temperature within target range for patient.. . . [name] and well-hydrated in no acute distress. Notes Left leg edema , with medial small open area , posterior wound and medial superior wound , all clean bases Electronic Signature(s) Signed: 06/15/2020 1:51:46 PM By: Tobi Bastos MD, MBA Entered By: Tobi Bastos on 06/15/2020 13:51:45 Arteaga, Joclyn J. (  [id number]) -------------------------------------------------------------------------------- Physician Orders Details Patient Name: MEYAH, CORLE. Date of Service: 06/15/2020 12:30 PM Medical Record Number: [id number] Patient Account Number: [id number] Date of Birth/Sex: April 30, 1946 (74 y.o. F) Treating RN: Cornell Barman Primary Care Provider: [name] Other Clinician: Referring Provider: [name] Treating Provider/Extender: Beverly Gust in Treatment: 83 Verbal / Phone Orders: No Diagnosis Coding Anesthetic (add to Medication List) Wound #5 Left,Medial Lower Leg o Topical Lidocaine 4% cream applied to wound bed prior to debridement (In Clinic Only). Primary Wound Dressing Wound #5 Left,Medial Lower Leg o Silver Collagen - moistened with hydrogel Secondary Dressing Wound #5 Left,Medial Lower Leg o ABD pad Dressing Change Frequency Wound #5 Left,Medial Lower Leg o Change dressing every week o Other: - Nurse visit Friday and Monday  if needed. Follow-up Appointments Wound #5 Left,Medial Lower Leg o Return Appointment in 1 week. o Nurse Visit as needed Edema Control Wound #5 Left,Medial Lower Leg o 4-Layer Compression System - Left Lower Extremity. o Patient to wear own compression stockings - On Right o Compression Pump: Use compression pump on left lower extremity for 60 minutes, twice daily. o Compression Pump: Use compression pump on right lower extremity for 60 minutes, twice daily. Electronic Signature(s) Signed: 06/15/2020 4:03:38 PM By: Tobi Bastos MD, MBA Signed: 06/15/2020 8:15:10 PM By: [name], [name], RN, CWS, [name], [name] Entered By: [name], [name], RN, CWS, Kim on 06/15/2020 13:47:02 [name], [name] [name] ([id number]) -------------------------------------------------------------------------------- Progress Note Details Patient Name: KIONA, BLUME. Date of Service: 06/15/2020 12:30 PM Medical Record Number: [id number] Patient Account Number: [id number] Date of Birth/Sex: 1946/01/07 (74 y.o. F) Treating RN: Cornell Barman Primary Care Provider: [name] Other Clinician: Referring Provider: [name] Treating Provider/Extender: Beverly Gust in Treatment: 79 Subjective History of Present Illness (HPI) Pleasant 73 year old with history of chronic venous insufficiency. No diabetes or peripheral vascular disease. Left ABI 1.29. Questionable history of left lower extremity DVT. She developed a recurrent ulceration on her left lateral calf in December 2015, which she attributes to poor diet and subsequent lower extremity edema. She underwent endovenous laser ablation of her left greater saphenous vein in 2010. She underwent laser ablation of accessory branch of left GSV in April 2016 by Dr. Kellie Simmering at [facility]. She was previously wearing Unna boots, which she tolerated well. Tolerating 2 layer compression and cadexomer iodine. She returns to clinic for follow-up and is without new  complaints. She denies any significant pain at this time. She reports persistent pain with pressure. No claudication or ischemic rest pain. No fever or chills. No drainage. READMISSION 11/13/16; this is a 74 year old woman who is not a diabetic. She is here for a review of a painful area on her left medial lower extremity. I note that she was seen here previously last year for wound I believe to be in the same area. At that time she had undergone previously a left greater saphenous vein ablation by Dr. Kellie Simmering and she had a ablation of the anterior accessory branch of the left greater saphenous vein in March 2016. Seeing that the wound actually closed over. In reviewing the history with her today the ulcer in this area has been recurrent. She describes a biopsy of this area in 2009 that only showed stasis physiology. 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 Do not see that she has ever had arterial studies in the left leg. She tells me she has a follow-up with Dr. Kellie Simmering in roughly 10 days In any case she developed the reopening of this area roughly a month ago. On the background of this she describes rapidly increasing edema which has responded to Lasix 40 mg and metolazone 2.5 mg as well as the patient's lymph massage. She has been told she has both venous insufficiency and lymphedema but she cannot tolerate compression stockings 11/28/16; the patient saw Dr. Kellie Simmering recently. Per the patient he did arterial Dopplers in the office that did not show evidence of arterial insufficiency, per the patient he stated "treat this like an ordinary venous ulcer". She also saw her dermatologist Dr. Ronnald Ramp who felt that this was more of a vascular ulcer. In general things are improving although she  arrives today with increasing bilateral lower extremity edema with weeping a deeper fluid through the wound on the left medial leg compatible with some degree of lymphedema 12/04/16; the patient's wound is fully epithelialized but I don't think fully healed. We will do another week of depression with Promogran and TCA however I suspect we'll be able to discharge her next week. This is a very unusual-looking wound which was initially a figure-of-eight type wound lying on its side surrounded by petechial like hemorrhage. She has had venous ablation on this side. She apparently does not have an arterial issue per Dr. Kellie Simmering. She saw her dermatologist thought it was "vascular". Patient is definitely going to need ongoing compression and I talked about this with her today she will go to elastic therapy after she leaves here next week 12/11/16; the patient's wound is not completely closed today. She has surrounding scar tissue and in further discussion with the patient it would appear that she had ulcers in this area in 2009 for a prolonged period of time ultimately requiring a punch biopsy of this area that only showed venous insufficiency. I did not previously pickup on this part of the history from the patient. 12/18/16; the patient's wound is completely epithelialized. There is no open area here. She has significant bilateral venous insufficiency with secondary lymphedema to a mild-to-moderate degree she does not have compression stockings.. She did not say anything to me when I was in the room, she told our intake nurse that she was still having pain in this area. This isn't unusual recurrent small open area. She is going to go to elastic therapy to obtain compression stockings. 12/25/16; the patient's wound is fully epithelialized. There is no open area here. The patient describes some continued episodic discomfort in this area medial left calf. However everything looks fine and healed here. She is been to  elastic therapy and caught herself 15-20 mmHg stockings, they apparently were having trouble getting 20-30 mm stockings in her size 01/22/17; this is a patient we discharged from the clinic a month ago. She has a recurrent open wound on her medial left calf. She had 15 mm support stockings. I told her I thought she needed 20-30 mm compression stockings. She tells me that she has been ill with hospitalization secondary to asthma and is been found to have severe hypokalemia likely secondary to a combination of Lasix and metolazone. This morning she noted blistering and leaking fluid on the posterior part of her left leg. She called our intake nurse urgently and we was saw her this afternoon. She has not had any  real discomfort here. I don't know that she's been wearing any stockings on this leg for at least 2-3 days. ABIs in this clinic were 1.21 on the right and 1.3 on the left. She is previously seen vascular surgery who does not think that there is a peripheral arterial issue. 01/30/17; Patient arrives with no open wound on the left leg. She has been to elastic therapy and obtained 20-10mmhg below knee stockings and she has one on the right leg today. READMISSION 02/19/18; this Rocchio is a now 74 year old patient we've had in this clinic perhaps 3 times before. I had last looked at her from January 07 December 2016 with an area on the medial left leg. We discharged her on 12/25/16 however she had to be readmitted on 01/22/17 with a recurrence. I have in my notes that we discharged her on 20-30 mm stockings although she tells me she was only wearing support hose because she cannot get stockings on predominantly related to her cervical spine surgery/issues. She has had previous ablations done by vein and vascular in [place] including a great saphenous vein ablation on the left with an anterior accessory branch ablation I think both of these were in 2016. On one of the previous visit she had a biopsy  noted 2009 that was negative. She is not felt to have an arterial issue. She is not a diabetic. She does have a history of obstructive sleep apnea hypertension asthma as well as chronic venous insufficiency and lymphedema. On this occasion she noted 2 dry scaly patch on her left leg. She tried to put lotion on this it didn't really help. There were 2 open areas.the patient has been seeing her primary physician from 02/05/18 through 02/14/18. She had Unna boots applied. The superior wound now on the lateral left leg has closed but she's had one wound that remains open on the lateral left leg. This is not the same spot as we dealt with in 2018. ABIs in this clinic were 1.3 bilaterally ZYANNA, LEISINGER ([id number]) 02/26/18; patient has a small wound on the left lateral calf. Dimensions are down. She has chronic venous insufficiency and lymphedema. 03/05/18; small open area on the left lateral calf. Dimensions are down. Tightly adherent necrotic debris over the surface of the wound which was difficult to remove. Also the dressing [over collagen] stuck to the wound surface. This was removed with some difficulty as well. Change the primary dressing to Hydrofera Blue ready 03/12/18; small open area on the left lateral calf. Comes in with tightly adherent surface eschar as well as some adherent Hydrofera Blue. 03/19/18; open area on the left lateral calf. Again adherent surface eschar as well as some adherent Hydrofera Blue nonviable subcutaneous tissue. She complained of pain all week even with the reduction from 4-3 layer compression I put on last week. Also she had an increase in her ankle and calf measurements probably related to the same thing. 03/26/18; open area on the left lateral calf. A very small open area remains here. We used silver alginate starting last week as the Hydrofera Blue seem to stick to the wound bed. In using 4-layer compression 04/02/18; the open area in the left lateral calf at some  adherent slough which I removed there is no open area here. We are able to transition her into her own compression stocking. Truthfully I think this is probably his support hose. However this does not maintain skin integrity will be limited. She cannot put over the toe compression  stockings on because of neck problems hand problems etc. She is allergic to the lining layer of juxta lites. We might be forced to use extremitease stocking should this fail READMIT 11/24/2018 Patient is now a 74 year old woman who is not a diabetic. She has been in this clinic on at least 3 previous occasions largely with recurrent wounds on her left leg secondary to chronic venous insufficiency with secondary lymphedema. Her situation is complicated by inability to get stockings on and an allergy to neoprene which is apparently a component and at least juxta lites and other stockings. As a result she really has not been wearing any stockings on her legs. She tells [place] that roughly 2 or 3 weeks ago she started noticing a stinging sensation just above her ankle on the left medial aspect. She has been diagnosed with pseudogout and she wondered whether this was what she was experiencing. She tried to dress this with something she bought at the store however subsequently it pulled skin off and now she has an open wound that is not improving. She has been using Vaseline gauze with a cover bandage. She saw her primary doctor last week who put an Haematologist on her. ABIs in this clinic was 1.03 on the left 2/12; the area is on the left medial ankle. Odd-looking wound with what looks to be surface epithelialization but a multitude of small petechial openings. This clearly not closed yet. We have been using silver alginate under 3 layer compression with TCA 2/19; the wound area did not look quite as good this week. Necrotic debris over the majority of the wound surface which required debridement. She continues to have a multitude of  what looked to be small petechial openings. She reminds [place] that she had a biopsy on this initially during her first outbreak in 2015 in [facility]. She expresses concern about this being a possible melanoma. 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 I assured her that this does not look anything like melanoma. Except for the petechial reaction it does look like a venous insufficiency area and she certainly has evidence of this on both sides 2/26; a difficult area on the left medial ankle. The patient clearly has chronic venous hypertension with some degree of lymphedema. The odd thing about the area is the small petechial hemorrhages. I am not really sure how to explain this. This was present last time and this is not a compression injury. We have been using Hydrofera Blue which I changed to last week 3/4; still using Hydrofera Blue. Aggressive debridement today. She does not have known arterial issues. She has seen Dr. Kellie Simmering at [facility] vein and vascular and and has an ablation on the left. [Anterior accessory branch of the greater saphenous]. From what I remember they did not feel she had an arterial issue. The patient has had this area biopsied in 2009 at [facility] dermatology and by her recollection they said this was "stasis". She is also follow-up with dermatology locally who thought that this was more of a vascular issue 3/11; using Hydrofera Blue. Aggressive debridement today. She does not have an arterial issue. We are using 3 layer compression although we may need to go to 4. The patient has been in for multiple changes to her wrap since I last saw her a week ago. She says that the area was leaking. I do not have too much more information on  what was found 01/19/19 on evaluation today patient was actually being seen for a nurse visit when unfortunately she had the area on her left lateral  lower extremity as well as weeping from the right lower extremity that became apparent. Therefore we did end up actually seeing her for a full visit with myself. She is having some pain at this site as well but fortunately nothing too significant at this point. No fevers, chills, nausea, or vomiting noted at this time. 3/18-Patient is back to the clinic with the left leg venous leg ulcer, the ulcer is larger in size, has a surface that is densely adherent with fibrinous tissue, the Hydrofera Blue was used but is densely adherent and there was difficulty in removing it. The right lower extremity was also wrapped for weeping edema. Patient has a new area over the left lateral foot above the malleolus that is small and appears to have no debris with intact surrounding skin. Patient is on increased dose of Lasix also as a means to edema management 3/25; the patient has a nonhealing venous ulcer on the medial left leg and last week developed a smaller area on the lateral left calf. We have been using Hydrofera Blue with a contact layer. 4/1; no major change in these wounds areas. Left medial and more recently left lateral calf. I tried Iodoflex last week to aid in debridement she did not tolerate this. She stated her pain was terrible all week. She took the top layer of the 4 layer compression off. 4/8; the patient actually looks somewhat better in terms of her more prominent left lateral calf wound. There is some healthy looking tissue here. She is still complaining of a lot of discomfort. 4/15; patient in a lot of pain secondary to sciatica. She is on a prednisone taper prescribed by her primary physician. She has the 2 areas one on the left medial and more recently a smaller area on the left lateral calf. Both of these just above the malleoli 4/22; her back pain is better but she still states she is very uncomfortable and now feels she is intolerant to the [employer]. No real change in the wounds we  have been using Sorbact. She has been previously intolerant to Iodoflex. There is not a lot of option about what we can use to debride this wound under compression that she no doubt needs. sHe states Ultram no longer works for her pain 4/29; no major change in the wounds slightly increased depth. Surface on the original medial wound perhaps somewhat improved however the more recent area on the lateral left ankle is 100% covered in very adherent debris we have been using Sorbact. She tolerates 4 layer compression well and her edema control is a lot better. She has not had to come in for a nurse check 5/6; no major change in the condition of the wounds. She did consent to debridement today which was done with some difficulty. Continuing Sorbact. She did not tolerate Iodoflex. She was in for a check of her compression the day after we wrapped her last week this was adjusted but nothing much was found 5/13; no major change in the condition or area of the wounds. I was able to get a fairly aggressive debridement done on the lateral left leg wound. Even using Sorbact under compression. She came back in on Friday to have the wrap changed. She says she felt uncomfortable on the lateral aspect of her ankle. She has a long history  of chronic venous insufficiency including previous ablation surgery on this side. 5/20-Patient returns for wounds on left leg with both wounds covered in slough, with the lateral leg wound larger in size, she has been in 3 layer compression and felt more comfortable, she describes pain in ankle, in leg and pins and needles in foot, and is about to try Pamelor for this 6/3; wounds on the left lateral and left medial leg. The area medially which is the most recent of the 2 seems to have had the largest increase in Waikele, KALAYSIA DEMONBREUN. ([id number]) dimensions. We have been using Sorbac to try and debride the surface. She has been to see orthopedics they apparently did a plain x-ray that was  indeterminant. Diagnosed her with neuropathy and they have ordered an MRI to determine if there is underlying osteomyelitis. This was not high on my thought list but I suppose it is prudent. We have advised her to make an appointment with vein and vascular in [place]. She has a history of a left greater saphenous and accessory vein ablations I wonder if there is anything else that can be done from a surgical point of view to help in these difficult refractory wounds. We have previously healed this wound on one occasion but it keeps on reopening [medial side] 6/10; deep tissue culture I did last week I think on the left medial wound showed both moderate E. coli and moderate staph aureus [MSSA]. She is going to require antibiotics and I have chosen Augmentin. We have been using Sorbact and we have made better looking wound surface on both sides but certainly no improvement in wound area. She was back in last Friday apparently for a dressing changes the wrap was hurting her outer left ankle. She has not managed to get a hold of vein and vascular in [place]. We are going to have to make her that appointment 6/17; patient is tolerating the Augmentin. She had an MRI that I think was ordered by orthopedic surgeon this did not show osteomyelitis or an abscess did suggest cellulitis. We have been using Sorbact to the lateral and medial ankles. We have been trying to arrange a follow-up appointment with vein and vascular in [place] or did her original ablations. We apparently an area sent the request to vein and vascular in [facility] 6/24; patient has completed the Augmentin. We do not yet have a vein and vascular appointment in [place]. I am not sure what the issue is here we have asked her to call tomorrow. We are using Sorbact. Making some improvements and especially the medial wound. Both surfaces however look better medial and lateral. 7/1; the patient has been in contact with vein and  vascular in [place] but has not yet received an appointment. Using Sorbact we have gradually improve the wound surface with no improvement in surface area. She is approved for Apligraf but the wound surface still is not completely viable. She has not had to come in for a dressing change 7/8; the patient has an appointment with vein and vascular on 7/31 which is a Friday afternoon. She is concerned about getting back here for [place] to dress her wounds. I think it is important to have them goal for her venous reflux/history of ablations etc. to see if anything else can be done. She apparently tested positive for 1 of the blood tests with regards to lupus and saw a rheumatologist. He has raised the issue of vasculitis again. I have had this thought in the  past however the evidence seems overwhelming that this is a venous reflux etiology. If the rheumatologist tells me there is clinical and laboratory investigation is positive for lupus I will rethink this. 7/15; the patient's wound surfaces are quite a bit better. The medial area which was her original wound now has no depth although the lateral wound which was the more recent area actually appears larger. Both with viable surfaces which is indeed better. Using Sorbact. I wanted to use Apligraf on her however there is the issue of the vein and vascular appointment on 7/31 at 2:00 in the afternoon which would not allow her to get back to be rewrapped and they would no doubt remove the graft 7/22; the patient's wound surfaces have moderate amount of debris although generally look better. The lateral one is larger with 2 small satellite areas superiorly. We are waiting for her vein and vascular appointment on 7/31. She has been approved for Apligraf which I would like to use after th 7/29; wound surfaces have improved no debridement is required we have been using Sorbact. She sees vein and vascular on Friday with this so question of whether anything can be  done to lessen the likelihood of recurrence and/or speed the healing of these areas. She is already had previous ablations. She no doubt has severe venous hypertension 8/5-Patient returns at 1 week, she was in [place] for 3 days by her podiatrist, we have been using so backed to the wound, she has increased pain in both the wounds on the left lower leg especially the more distal one on the lateral aspect 8/12-Patient returns at 1 week and she is agreeable to having debridement in both wounds on her left leg today. We have been using Sorbact, and vascular studies were reviewed at last visit 8/19; the patient arrives with her wounds fairly clean and no debridement is required. We have used Sorbact which is really done a nice job in cleaning up these very difficult wound surfaces. The patient saw Dr. Donzetta Matters of vascular surgery on 7/31. He did not feel that there was an arterial component. He felt that her treated greater saphenous vein is adequately addressed and that the small saphenous vein did not appear to be involved significantly. She was also noted to have deep venous reflux which is not treatable. Dr. Donzetta Matters mentioned the possibility of a central obstructive component leading to reflux and he offered her central venography. She wanted to discuss this or think about it. I have urged her to go ahead with this. She has had recurrent difficult wounds in these areas which do heal but after months in the clinic. If there is anything that can be done to reduce the likelihood of this I think it is worth it. 9/2 she is still working towards getting follow-up with Dr. Donzetta Matters to schedule her CT. Things are quite a bit worse venography. I put Apligraf on 2 weeks ago on both wounds on the medial and lateral part of her left lower leg. She arrives in clinic today with 3 superficial additional wounds above the area laterally and one below the wound medially. She describes a lot of discomfort. I think these are  probably wrapped injuries. Does not look like she has cellulitis. 07/20/2019 on evaluation today patient appears to be doing somewhat poorly in regard to her lower extremity ulcers. She in fact showed signs of erythema in fact we may even be dealing with an infection at this time. Unfortunately I am unsure  if this is just infection or if indeed there may be some allergic reaction that occurred as a result of the Apligraf application. With that being said that would be unusual but nonetheless not impossible in this patient is one who is unfortunately allergic to quite a bit. Currently we have been using the Sorbact which seems to do as well as anything for her. I do think we may want to obtain a culture today to see if there is anything showing up there that may need to be addressed. 9/16; noted that last week the wounds look worse in 1 week follow-up of the Apligraf. Using Sorbact as of 2 days ago. She arrives with copious amounts of drainage and new skin breakdown on the back of the left calf. The wounds arm more substantial bilaterally. There is a fair amount of swelling in the left calf no overt DVT there is edema present I think in the left greater than right thigh. She is supposed to go on 9/28 for CT venography. The wounds on the medial and lateral calf are worse and she has new skin breakdown posteriorly at least new for me. This is almost developing into a circumferential wound area The Apligraf was taken off last week which I agree with things are not going in the right direction a culture was done we do not have that back yet. She is on Augmentin that she started 2 days ago 9/23; dressing was changed by her nurses on Monday. In general there is no improvement in the wound areas although the area looks less angry than last week. She did get Augmentin for MSSA cultured on the 14th. She still appears to have too much swelling in the left leg even with 3 layer compression 9/30; the patient  underwent her procedure on 9/28 by Dr. Donzetta Matters at vascular and vein specialist. She was discovered to have the common iliac vein measuring 12.2 mm but at the level of L4-L5 measured 3 mm. After stenting it measured 10 mm. 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 Rouleaux flow in the common femoral and femoral vein was observed much improved after stenting. We are using silver alginate to the wounds on the medial and lateral ankle on the left. 4 layer compression 10/7; the patient had fluid swelling around her knee and 4 layer compression. At the advice of vein and vascular this was reduced to 3 layer which she is tolerating better. We have been using silver alginate under 3 layer compression since last Friday 10/14; arrives with the areas on the left ankle looking a lot better. Inflammation in the area also a lot better. She came in for a nurse check on 10/9 10/21; continued nice improvement. Slight improvements in surface area of both the medial and lateral wounds on the left. A lot of the satellite lesions in the weeping erythema around these from stasis dermatitis is resolved. We have been using silver alginate CORRINNA, KARAPETYAN ([id number]) 10/28; general improvement in the entire wound areas although not a lot of change in dimensions the wound certainly looks better. There is a lot less in terms of venous inflammation. Continue silver alginate this week however look towards Hydrofera Blue next week 11/4; very adherent debris on the medial wound left wound is not as bad. We have been using silver alginate. Change to [facility] today 11/11; very adherent debris on both wound areas. She went to vein and vascular last week and follow-up they put in [place]  boot on this today. He says the [facility] was adherent. Wound is definitely not as good as last week. Especially on the left there the satellite lesions look more prominent 11/18; absolutely no better. erythema on lateral  aspect with tenderness. 09/30/2019 on evaluation today patient appears to actually be doing better. Dr. Dellia Nims did put her on doxycycline last week which I do believe has helped her at this point. Fortunately there is no signs of active infection at this time. No fevers, chills, nausea, vomiting, or diarrhea. I do believe he may want extend the doxycycline for 7 additional days just to ensure everything does completely cleared up the patient is in agreement with that plan. Otherwise she is going require some sharp debridement today 12/2; patient is completing a 2-week course of doxycycline. I gave her this empirically for inflammation as well as infection when I last saw her 2 weeks ago. All of this seems to be better. She is using silver alginate she has the area on the medial aspect of the larger area laterally and the 2 small satellite regions laterally above the major wound. 12/9; the patient's wound on the left medial and left lateral calf look really quite good. We have been using silver alginate. She saw vein and vascular in follow-up on 10/09/2019. She has had a previous left greater saphenous vein ablation by Dr. Oscar La in 2016. More recently she underwent a left common iliac vein stent by Dr. 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 The swelling is improved and certainly the wounds have improved. The patient shows [place] today area on the right medial calf there is almost no wound but leaking lymphedema. She says she start this started 3 or 4 days ago. She did not traumatize it. It is not painful. She does not wear compression on that side 12/16; the patient continues to do well laterally. Medially still requiring debridement. The area on the right calf did not materialize to anything and is not currently open. We wrapped this last time. She has support stockings for that leg although I am not sure they are going to provide adequate compression 12/23; the lateral wound looks stable.  Medially still requiring debridement for tightly adherent fibrinous debris. We've been using silver alginate. Surface area not any different 12/30; neither wound is any better with regards to surface and the area on the left lateral is larger. I been using silver alginate to the left lateral which look quite good last week and Sorbact to the left medial 11/11/2019. Lateral wound area actually looks better and somewhat smaller. Medial still requires a very aggressive debridement today. We have been using Sorbact on both wound areas 1/13; not much better still adherent debris bilaterally. I been using Sorbact. She has severe venous hypertension. Probably some degree of dermal fibrosis distally. I wonder whether tighter compression might help and I am going to try that today. We also need to work on the bioburden 1/20; using Sorbact. She has severe venous hypertension status post stent placement for pelvic vein compression. We applied gentamicin last time to see if we could reduce bioburden I had some discussion with her today about the use of pentoxifylline. This is occasionally used in this setting for wounds with refractory venous insufficiency. However this interacts with Plavix. She tells me that she was put on this after stent placement for 3 months. She will call Dr. Claretha Cooper office to discuss 1/27; we are using gentamicin under Sorbact. 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 She has a stent. Wound medially is measuring smaller this week. Laterally measuring slightly larger although she has some satellite lesions superiorly 2/3; gentamicin under Sorbact under 4-layer compression. 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 She has a stent on Plavix. Her wounds are measuring smaller this week. More substantially laterally where there is a satellite lesion superiorly. 2/10; gentamicin under Sorbac. 4-layer compression. Patient communicated with  Dr. Donzetta Matters at [facility] in [place]. He is okay with the patient coming off Plavix I will therefore start her on pentoxifylline for a 1 month trial. In general her wounds look better today. I had some concerns about swelling in the left thigh however she measures 61.5 on the right and 63 on the mid thigh which does not suggest there is any difficulty. The patient is not describing any pain. 2/17; gentamicin under Sorbac 4-layer compression. She has been on pentoxifylline for 1 week and complains of loose stool. No nausea she is eating and drinking well 2/24; the patient apparently came in 2 days ago for a nurse visit when her wrap fell down. Both areas look a little worse this week macerated medially and satellite lesions laterally. Change to silver alginate today 3/3; wounds are larger today especially medially. She also has more swelling in her foot lower leg and I even noted some swelling in her posterior thigh which is tender. I wonder about the patency of her stent. Fortuitously she sees Dr. Claretha Cooper group on Friday 3/10; Mrs. Gaspar was seen by [facility] on 3/5. The patient underwent ultrasound. There was no evidence of thrombosis involving the IVC no evidence of thrombosis involving the right common iliac vein there is no evidence of thrombosis involving the right external iliac vein the left external vein is also patent. The right common iliac vein stent appears patent bilateral common femoral veins are compressible and appear patent. I was concerned about the left common iliac stent however it looks like this is functional. She has some edema in the posterior thigh that was tender she still has that this week. I also note they had trouble finding the pulses in her left foot and booked her for an ABI baseline in 4 weeks. She will follow up in 6 months for repeat IVC duplex. The patient stopped the pentoxifylline because of diarrhea. It does not look like that was being  effective in any case. I have advised her to go back on her aspirin 81 mg tablet, vascular it also suggested this 3/17; comes in today with her wound surfaces a lot better. The excoriations from last week considerably better probably secondary to the TCA. We have been using silver alginate 3/24; comes in today with smaller wounds both medially and laterally. Both required debridement. There are 2 small satellite areas superiorly laterally. She also has a very odd bandlike area in the mid calf almost looking like there was a weakness in the wrap in a localized area. I would write this off as being this however anteriorly she has a small raised ballotable area that is very tender almost reminiscent of an abscess but there was no obvious purulent surface to it. 02/04/20 upon evaluation today patient appears to be doing fairly well in regard to her wounds today. Fortunately there is no signs of active infection at this time. No fevers, chills, nausea, vomiting, or diarrhea. She has been tolerating the dressing changes without complication. Fortunately I feel like she is showing signs  of improvement although has been sometime since have seen her. Nonetheless the area of concern that Dr. Dellia Nims had last week where she had possibly an area of the wrap that was we can allow the leg to bulge appears to be doing significantly better today there is no signs of anything worsening. XYLIA, SCHERGER ([id number]) 4/7; the patient's wounds on her medial and lateral left leg continue to contract. We have been using a regular alginate. Last week she developed an area on the right medial lower leg which is probably a venous ulcer as well. 4/14; the wounds on her left medial and lateral lower leg continue to contract. Surface eschar. We have been using regular alginate. The area on the right medial lower leg is closed. We have been putting both legs under 4-layer contraction. The patient went back to see vein and  vascular she had arterial studies done which were apparently "quite good" per the patient although I have not read their notes I have never felt she had an arterial issue. 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 This is been longstanding and refractory to exercise, leg elevation and longstanding use of compression wraps in our clinic as well as compression stockings on the times we have been able to get these to heal 4/21; we thought she actually might be close this week however she arrives in clinic with a lot of edema in her upper left calf and into her posterior thigh. This is been an intermittent problem here. She says the wrap fell down but it was replaced with a nurse visit on Monday. We are using calcium alginate to the wounds and the wound sizes there not terribly larger than last week but there is a lot more edema 4/28; again wound edges are smaller on both sides. Her edema is better controlled than last time. She is obtained her compression pumps from [employer] although they have not been to her home to set these up. 5/5; left medial and left lateral both look stable. I am not sure the medial is any smaller. We have been using calcium alginate under 4-layer compression. She had an area on the right medial. This was eschared today. We have been wrapping this as well. She does not tolerate external compression stockings due to a history of various contact allergies. She has her compression pumps however the representative from the company is coming on her to show her how to use these tomorrow 5/19; patient with severe chronic venous insufficiency secondary to central venous disease. She had a stent placed in her left common iliac vein. She has done better since but still difficult to control wounds. She comes in today with nothing open on the right leg. Her areas on the left medial and left lateral are just about closed. We are using  calcium alginate under 4-layer compression. She is using her external compression pumps at home She only has 15-20 support stockings. States she cannot get anything tighter than that on. 03/30/20-Patient returns at 1 week, the wounds on the left leg are both slightly bigger, the last week she was on 3 layer compression which started to slide down. She is starting to use her lymphedema pumps although she stated on 1 day her right ankle started to swell up and she have to stop that day. Unfortunately the open area seem to oscillate between improving to the point of healing and then flaring up all to do with effectiveness of compression or  lack of due to the left leg topography not keeping the compression wraps from rolling down 6/2 patient comes in with a 15/20 mmHg stocking on the right leg. She tells me that she developed a lot of swelling in her ankles she saw orthopedics she was felt to possibly be having a flare of pseudogout versus some other type of arthritis. She was put on steroids for a respiratory issue so that helps with the inflammation. She has not been using the pumps all week. She thinks the left thigh is more swollen than usual and I would agree with that. She has an appointment with Dr. Donzetta Matters 9 days or so from now 6/9; both wounds on the left medial and left lateral are smaller. We have been using calcium alginate under compression. She does not have an open wound on the right leg she is using a stocking and her compression pumps things are going well. She has an appointment with Dr. Donzetta Matters with regards to her stent in the left common iliac vein 6/16; the wounds on the left medial and left lateral ankle continues to contract. The patient saw Dr. Donzetta Matters and I think he seems satisfied. Ordered follow-up venous reflux studies on both sides in September. Cautioned that she may need thigh-high stockings. She has been using calcium alginate under compression on the left and her own stocking on  the right leg. She tells [place] there are no open wounds on the right 6/23; left lateral is just about closed. Medial required debridement today. We have been using calcium alginate. Extensive discussion about the compression pumps she is only using these on 25 mmHg states she could not take 40 or 30 when the wrap came out to her home to demonstrate these. He said they should not feel tight 6/30; the left lateral wound has a slight amount of eschar. . The area medially is about the same using Hydrofera Blue. 7/7; left lateral wound still has some eschar. I will remove this next week may be closed. The area medially is very small using Hydrofera Blue with improvement. Unfortunately the stockings fell down. Unfortunately the blisters have developed at the edge of where the wrap fell. When this happened she says her legs hurt she did not use her pumps. We are not open Monday for her to come in and change the wraps and she had an appointment yesterday. She also tells me that she is going to have an MRI of her back. She is having pain radiating into her left anterior leg she thinks her from an L5 disc. She saw Dr. Ellene Route of neurosurgery 7/14; the area on the left lateral ankle area is closed. Still a small area medially however it looks better as well. We have been using Hydrofera Blue under 4-layer compression 7/21; left lateral ankle is still closed however her wound on the medial left calf is actually larger. This is probably because Hydrofera Blue got stuck to the wound. She came in for a nurse change on Friday and will do that again this week I was concerned about the amount of swelling that she had last week however she is using her compression pumps twice a day and the swelling seems well controlled 7/28; remaining wound on the left medial lower leg is smaller. We have been using moistened silver collagen under compression she is coming back for a nurse visit. For reasons that were not really clear  she was just keeping her legs elevated and not using her compression  pumps. I have asked her to use the compression pumps. She does not have any wounds on the right leg 06/15/20-Patient returns at 2 weeks, her LLE edema is worse and she developed a blister wound that is new and has bigger posterior calf wound on right, we are using Prisma with pad, 4 layer compression. she has been on lasix 40 mg daily Objective Constitutional alert and oriented x 3. sitting or standing blood pressure is within target range for patient.. supine blood pressure is within target range for patient.. pulse regular and within target range for patient.Marland Kitchen respirations regular, non-labored and within target range for patient.Marland Kitchen temperature SHANITRA, PHILLIPPI ([id number]) within target range for patient.. [name] and well-hydrated in no acute distress. Vitals Time Taken: 12:41 PM, Height: 63 in, Weight: 224.7 lbs, BMI: 39.8, Temperature: 98.1 F, Pulse: 73 bpm, Respiratory Rate: 14 breaths/min, Blood Pressure: 154/82 mmHg. General Notes: Left leg edema , with medial small open area , posterior wound and medial superior wound , all clean bases Integumentary (Hair, Skin) Wound #12 status is Open. Original cause of wound was Gradually Appeared. The wound is located on the Left,Lateral Lower Leg. The wound measures 1.2cm length x 0.6cm width x 0.1cm depth; 0.565cm^2 area and 0.057cm^3 volume. There is Fat Layer (Subcutaneous Tissue) Exposed exposed. There is no tunneling or undermining noted. There is a medium amount of serous drainage noted. The wound margin is flat and intact. There is large (67-100%) red granulation within the wound bed. There is no necrotic tissue within the wound bed. Wound #5 status is Open. Original cause of wound was Gradually Appeared. The wound is located on the Left,Medial Lower Leg. The wound measures 2.8cm length x 1.8cm width x 0.1cm depth; 3.958cm^2 area and 0.396cm^3 volume. There is no  tunneling or undermining noted. There is a medium amount of serosanguineous drainage noted. The wound margin is flat and intact. There is small (1-33%) red granulation within the wound bed. There is a large (67-100%) amount of necrotic tissue within the wound bed including Adherent Slough. Procedures Wound #12 Pre-procedure diagnosis of Wound #12 is a Venous Leg Ulcer located on the Left,Lateral Lower Leg . There was a Four Layer Compression Therapy Procedure with a pre-treatment ABI of 1 by Cornell Barman, RN. Post procedure Diagnosis Wound #12: Same as Pre-Procedure Wound #5 Pre-procedure diagnosis of Wound #5 is a Lymphedema located on the Left,Medial Lower Leg . There was a Four Layer Compression Therapy Procedure with a pre-treatment ABI of 1 by Cornell Barman, RN. Post procedure Diagnosis Wound #5: Same as Pre-Procedure Plan Anesthetic (add to Medication List): Wound #5 Left,Medial Lower Leg: Topical Lidocaine 4% cream applied to wound bed prior to debridement (In Clinic Only). Primary Wound Dressing: Wound #5 Left,Medial Lower Leg: Silver Collagen - moistened with hydrogel Secondary Dressing: Wound #5 Left,Medial Lower Leg: ABD pad Dressing Change Frequency: Wound #5 Left,Medial Lower Leg: Change dressing every week Other: - Nurse visit Friday and Monday if needed. Follow-up Appointments: Wound #5 Left,Medial Lower Leg: Return Appointment in 1 week. Nurse Visit as needed Edema Control: Wound #5 Left,Medial Lower Leg: 4-Layer Compression System - Left Lower Extremity. Patient to wear own compression stockings - On Right Compression Pump: Use compression pump on left lower extremity for 60 minutes, twice daily. Compression Pump: Use compression pump on right lower extremity for 60 minutes, twice daily. -Continue 4 Layer compression with Prisma -Double lasix for next week to 80 mg daily, asked to check daily weights -Return next week, MD  visit NORMAN, BIER  ([id number]) Electronic Signature(s) Signed: 06/15/2020 1:52:50 PM By: Tobi Bastos MD, MBA Entered By: Tobi Bastos on 06/15/2020 13:52:50 NAKEDA, LEBRON ([id number]) -------------------------------------------------------------------------------- SuperBill Details Patient Name: VANETTA, RULE. Date of Service: 06/15/2020 Medical Record Number: [id number] Patient Account Number: [id number] Date of Birth/Sex: 05/19/1946 (74 y.o. 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

## 2020-06-16 NOTE — Progress Notes (Signed)
Amber, [name] ([id number]) Visit Report for 06/15/2020 Arrival Information Details Patient Name: [name], [name]. Date of Service: 06/15/2020 12:30 PM Medical Record Number: [id number] Patient Account Number: [id number] Date of Birth/Sex: May 28, 1946 (74 y.o. F) Treating RN: [name] Canavan Primary Care Derricka Mertz: [name] Other Clinician: Referring Denarius Sesler: [name] Treating Gisell Buehrle/Extender: Beverly Gust in Treatment: 79 Visit Information History Since Last Visit Added or deleted any medications: No Patient Arrived: Ambulatory Had a fall or experienced change in No Arrival Time: 12:40 activities of daily living that may affect Accompanied By: self risk of falls: Transfer Assistance: None Hospitalized since last visit: No Patient Requires Transmission-Based No Pain Present Now: Yes Precautions: Patient Has Alerts: Yes Patient Alerts: Patient on Blood Thinner aspirin 81 Electronic Signature(s) Signed: 06/15/2020 4:18:47 PM By: [name] Canavan Entered By: [name] Canavan on 06/15/2020 12:41:00 [name] ([id number]) -------------------------------------------------------------------------------- Compression Therapy Details Patient Name: Amber, [name]. Date of Service: 06/15/2020 12:30 PM Medical Record Number: [id number] Patient Account Number: [id number] Date of Birth/Sex: Sep 20, 1946 (74 y.o. F) Treating RN: Cornell Barman Primary Care Deacon Gadbois: [name] Other Clinician: Referring Jese Comella: [name] Treating Woodroe Vogan/Extender: Beverly Gust in Treatment: 79 Compression Therapy Performed for Wound Assessment: Wound #12 Left,Lateral Lower Leg Performed By: Clinician Cornell Barman, RN Compression Type: Four Layer Pre Treatment ABI: 1 Post Procedure Diagnosis Same as Pre-procedure Electronic Signature(s) Signed: 06/15/2020 8:15:10 PM By: [name], [name], RN, CWS, [name], [name] Entered By: [name], [name], RN, CWS, Kim on 06/15/2020  13:46:08 [name] ([id number]) -------------------------------------------------------------------------------- Compression Therapy Details Patient Name: [name], [name]. Date of Service: 06/15/2020 12:30 PM Medical Record Number: [id number] Patient Account Number: [id number] Date of Birth/Sex: January 23, 1946 (74 y.o. F) Treating RN: Cornell Barman Primary Care Caroleann Casler: [name] Other Clinician: Referring Deirdra Heumann: [name] Treating Jourdin Connors/Extender: Beverly Gust in Treatment: 79 Compression Therapy Performed for Wound Assessment: Wound #5 Left,Medial Lower Leg Performed By: Clinician Cornell Barman, RN Compression Type: Four Layer Pre Treatment ABI: 1 Post Procedure Diagnosis Same as Pre-procedure Electronic Signature(s) Signed: 06/15/2020 8:15:10 PM By: [name], [name], RN, CWS, [name], [name] Entered By: [name], [name], RN, CWS, Kim on 06/15/2020 13:46:08 [name] ([id number]) -------------------------------------------------------------------------------- Encounter Discharge Information Details Patient Name: Amber, [name]. Date of Service: 06/15/2020 12:30 PM Medical Record Number: [id number] Patient Account Number: [id number] Date of Birth/Sex: 27-Feb-1946 (74 y.o. F) Treating RN: Cornell Barman Primary Care Lake Breeding: [name] Other Clinician: Referring Kanoelani Dobies: [name] Treating Meeghan Skipper/Extender: Beverly Gust in Treatment: 9 Encounter Discharge Information Items Discharge Condition: Stable Ambulatory Status: Ambulatory Discharge Destination: Home Transportation: Private Auto Accompanied By: self Schedule Follow-up Appointment: Yes Clinical Summary of Care: Electronic Signature(s) Signed: 06/15/2020 8:15:10 PM By: [name], [name], RN, CWS, [name], [name] Entered By: [name], [name], RN, CWS, Kim on 06/15/2020 13:49:42 Wayson, [name] ([id number]) -------------------------------------------------------------------------------- Lower Extremity  Assessment Details Patient Name: Amber, [name]. Date of Service: 06/15/2020 12:30 PM Medical Record Number: [id number] Patient Account Number: [id number] Date of Birth/Sex: 1946/01/05 (74 y.o. F) Treating RN: [name] Canavan Primary Care Naftuli Dalsanto: [name] Other Clinician: Referring Kyera Felan: [name] Treating Jalen Oberry/Extender: Beverly Gust in Treatment: 79 Edema Assessment Assessed: [Left: Yes] [Right: No] Edema: [Left: Ye] [Right: s] Calf Left: Right: Point of Measurement: cm From Medial Instep 53 cm cm Ankle Left: Right: Point of Measurement: cm From Medial Instep 22.5 cm cm Vascular Assessment Pulses: Dorsalis Pedis Palpable: [Left:Yes] Posterior Tibial Palpable: [Left:Yes] Electronic Signature(s) Signed: 06/15/2020 4:18:47 PM By: [name] Canavan Entered By: [name] Canavan on  06/15/2020 13:16:02 MELONY, TENPAS ([id number]) -------------------------------------------------------------------------------- Multi Wound Chart Details Patient Name: Amber, [name]. Date of Service: 06/15/2020 12:30 PM Medical Record Number: [id number] Patient Account Number: [id number] Date of Birth/Sex: [birth year] (74 y.o. F) Treating RN: Cornell Barman Primary Care Ayansh Feutz: [name] Other Clinician: Referring Ayeza Therriault: [name] Treating Hamzeh Tall/Extender: Beverly Gust in Treatment: 79 Vital Signs Height(in): 80 Pulse(bpm): 22 Weight(lbs): 224.7 Blood Pressure(mmHg): 154/82 Body Mass Index(BMI): 40 Temperature(F): 98.1 Respiratory Rate(breaths/min): 14 Photos: [N/A:N/A] Wound Location: Left, Lateral Lower Leg Left, Medial Lower Leg N/A Wounding Event: Gradually Appeared Gradually Appeared N/A Primary Etiology: Venous Leg Ulcer Lymphedema N/A Comorbid History: Cataracts, Asthma, Sleep Apnea, Cataracts, Asthma, Sleep Apnea, N/A Deep Vein Thrombosis, Deep Vein Thrombosis, Hypertension, Peripheral Venous Hypertension, Peripheral  Venous Disease, Osteoarthritis, Received Disease, Osteoarthritis, Received Chemotherapy, Received Radiation Chemotherapy, Received Radiation Date Acquired: 06/15/2020 11/19/2018 N/A Weeks of Treatment: 0 79 N/A Wound Status: Open Open N/A Measurements L x W x D (cm) 1.2x0.6x0.1 2.8x1.8x0.1 N/A Area (cm) : 0.565 3.958 N/A Volume (cm) : 0.057 0.396 N/A % Reduction in Area: 0.00% 53.70% N/A % Reduction in Volume: 0.00% 53.70% N/A Classification: Partial Thickness Full Thickness Without Exposed N/A Support Structures Exudate Amount: Medium Medium N/A Exudate Type: Serous Serosanguineous N/A Exudate Color: amber red, brown N/A Wound Margin: Flat and Intact Flat and Intact N/A Granulation Amount: Large (67-100%) Small (1-33%) N/A Granulation Quality: Red Red N/A Necrotic Amount: None Present (0%) Large (67-100%) N/A Exposed Structures: Fat Layer (Subcutaneous Tissue) N/A N/A Exposed: Yes Fascia: No Tendon: No Muscle: No Joint: No Bone: No Epithelialization: None None N/A Treatment Notes Electronic Signature(s) Signed: 06/15/2020 8:15:10 PM By: [name], [name], RN, CWS, [name], [name] Entered By: [name], [name], RN, CWS, Kim on 06/15/2020 13:45:28 Turley, [name] ([name] ([id number]) -------------------------------------------------------------------------------- Multi-Disciplinary Care Plan Details Patient Name: BETTYLEE, FEIG. Date of Service: 06/15/2020 12:30 PM Medical Record Number: [id number] Patient Account Number: [id number] Date of Birth/Sex: 11/04/1946 (74 y.o. F) Treating RN: [name] Canavan Primary Care Lis Savitt: [name] Other Clinician: Referring Saw Mendenhall: [name] Treating Majd Tissue/Extender: Beverly Gust in Treatment: 79 Active Inactive Medication Nursing Diagnoses: Knowledge deficit related to medication safety: actual or potential Goals: Patient/caregiver will demonstrate understanding of new oral/IV medications prescribed  at the [facility] (topical prescriptions are covered under the skin breakdown problem) Date Initiated: 12/16/2019 Target Resolution Date: 01/13/2020 Goal Status: Active Interventions: Assess for medication contraindications each visit where new medications are prescribed Treatment Activities: New medication prescribed at [place] : 12/16/2019 Notes: Soft Tissue Infection Nursing Diagnoses: Impaired tissue integrity Goals: Patient's soft tissue infection will resolve Date Initiated: 12/10/2018 Target Resolution Date: 01/09/2019 Goal Status: Active Interventions: Assess signs and symptoms of infection every visit Notes: Venous Leg Ulcer Nursing Diagnoses: Actual venous Insuffiency (use after diagnosis is confirmed) Goals: Patient will maintain optimal edema control Date Initiated: 12/10/2018 Target Resolution Date: 01/09/2019 Goal Status: Active Interventions: Assess peripheral edema status every visit. Treatment Activities: Therapeutic compression applied : 12/10/2018 Notes: Wound/Skin Impairment Nursing Diagnoses: KINJAL, NEITZKE ([id number]) Impaired tissue integrity Goals: Patient/caregiver will verbalize understanding of skin care regimen Date Initiated: 12/10/2018 Target Resolution Date: 01/09/2019 Goal Status: Active Interventions: Assess ulceration(s) every visit Treatment Activities: Topical wound management initiated : 12/10/2018 Notes: Electronic Signature(s) Signed: 06/15/2020 4:18:47 PM By: [name] Canavan Signed: 06/15/2020 8:15:10 PM By: [name], [name], RN, CWS, [name], [name] Entered By: [name], [name], RN, CWS, Kim on 06/15/2020 13:45:14 Cardarelli, [name] ([id number]) -------------------------------------------------------------------------------- Pain Assessment Details Patient Name: CHRISTAL, LAGERSTROM.  Date of Service: 06/15/2020 12:30 PM Medical Record Number: [id number] Patient Account Number: [id number] Date of Birth/Sex: 08-19-1946 (74 y.o. F) Treating RN: [name] Canavan Primary Care Jenet Durio: [name] Other Clinician: Referring Yassine Brunsman: [name] Treating Alizee Maple/Extender: Beverly Gust in Treatment: 79 Active Problems Location of Pain Severity and Description of Pain Patient Has Paino Yes Site Locations Pain Location: Generalized Pain Duration of the Pain. Constant / Intermittento Intermittent Rate the pain. Current Pain Level: 3 Pain Management and Medication Current Pain Management: Electronic Signature(s) Signed: 06/15/2020 4:18:47 PM By: [name] Canavan Entered By: [name] Canavan on 06/15/2020 12:47:56 Abbs, [name] ([id number]) -------------------------------------------------------------------------------- Patient/Caregiver Education Details Patient Name: ZAELYN, NOACK. Date of Service: 06/15/2020 12:30 PM Medical Record Number: [id number] Patient Account Number: [id number] Date of Birth/Gender: 09/16/1946 (74 y.o. F) Treating RN: Cornell Barman Primary Care Physician: [name] Other Clinician: Referring Physician: [name] Treating Physician/Extender: Beverly Gust in Treatment: 77 Education Assessment Education Provided To: Patient Education Topics Provided Venous: Handouts: Controlling Swelling with Multilayered Compression Wraps Methods: Demonstration, Explain/Verbal Responses: State content correctly Wound/Skin Impairment: Handouts: Caring for Your Ulcer Methods: Demonstration, Explain/Verbal Responses: State content correctly Electronic Signature(s) Signed: 06/15/2020 8:15:10 PM By: [name], [name], RN, CWS, [name], [name] Entered By: [name], [name], RN, CWS, Kim on 06/15/2020 13:48:26 Seebeck, [name] ([id number]) -------------------------------------------------------------------------------- Wound Assessment Details Patient Name: ZONYA, GUDGER. Date of Service: 06/15/2020 12:30 PM Medical Record Number: [id number] Patient Account Number: [id number] Date of Birth/Sex:  [birth year] (74 y.o. F) Treating RN: [name] Canavan Primary Care Demetrice Amstutz: [name] Other Clinician: Referring Raeshawn Vo: [name] Treating Amoy Steeves/Extender: Beverly Gust in Treatment: 79 Wound Status Wound Number: 12 Primary Venous Leg Ulcer Etiology: Wound Location: Left, Lateral Lower Leg Wound Open Wounding Event: Gradually Appeared Status: Date Acquired: 06/15/2020 Comorbid Cataracts, Asthma, Sleep Apnea, Deep Vein Thrombosis, Weeks Of Treatment: 0 History: Hypertension, Peripheral Venous Disease, Osteoarthritis, Clustered Wound: No Received Chemotherapy, Received Radiation Photos Wound Measurements Length: (cm) 1.2 % Width: (cm) 0.6 % Depth: (cm) 0.1 E Area: (cm) 0.565 Volume: (cm) 0.057 Reduction in Area: 0% Reduction in Volume: 0% pithelialization: None Tunneling: No Undermining: No Wound Description Classification: Partial Thickness F Wound Margin: Flat and Intact S Exudate Amount: Medium Exudate Type: Serous Exudate Color: amber oul Odor After Cleansing: No lough/Fibrino No Wound Bed Granulation Amount: Large (67-100%) Exposed Structure Granulation Quality: Red Fascia Exposed: No Necrotic Amount: None Present (0%) Fat Layer (Subcutaneous Tissue) Exposed: Yes Tendon Exposed: No Muscle Exposed: No Joint Exposed: No Bone Exposed: No Treatment Notes Wound #12 (Left, Lateral Lower Leg) Notes prisma, ABD, 4 layer unna to anchor Electronic Signature(s) KHYRA, VISCUSO ([id number]) Signed: 06/15/2020 4:18:47 PM By: [name] Canavan Entered By: [name] Canavan on 06/15/2020 13:14:18 Bourke, [name] ([id number]) -------------------------------------------------------------------------------- Wound Assessment Details Patient Name: TYANA, BUTZER. Date of Service: 06/15/2020 12:30 PM Medical Record Number: [id number] Patient Account Number: [id number] Date of Birth/Sex: 1946/10/07 (74 y.o. F) Treating RN: [name] Canavan Primary  Care Nicosha Struve: [name] Other Clinician: Referring [name] [name]: [name] Treating Kennya Schwenn/Extender: Beverly Gust in Treatment: 79 Wound Status Wound Number: 5 Primary Lymphedema Etiology: Wound Location: Left, Medial Lower Leg Wound Open Wounding Event: Gradually Appeared Status: Date Acquired: 11/19/2018 Comorbid Cataracts, Asthma, Sleep Apnea, Deep Vein Thrombosis, Weeks Of Treatment: 79 History: Hypertension, Peripheral Venous Disease, Osteoarthritis, Clustered Wound: No Received Chemotherapy, Received Radiation Photos Wound Measurements Length: (cm) 2.8 Width: (cm) 1.8 Depth: (cm) 0.1 Area: (cm) 3.958 Volume: (cm) 0.396 % Reduction in Area: 53.7% % Reduction in  Volume: 53.7% Epithelialization: None Tunneling: No Undermining: No Wound Description Classification: Full Thickness Without Exposed Support Structures Wound Margin: Flat and Intact Exudate Amount: Medium Exudate Type: Serosanguineous Exudate Color: red, brown Foul Odor After Cleansing: No Slough/Fibrino Yes Wound Bed Granulation Amount: Small (1-33%) Granulation Quality: Red Necrotic Amount: Large (67-100%) Necrotic Quality: Adherent Slough Treatment Notes Wound #5 (Left, Medial Lower Leg) Notes prisma, ABD, 4 layer unna to anchor Electronic Signature(s) Signed: 06/15/2020 4:18:47 PM By: [name] Canavan Entered By: [name] Canavan on 06/15/2020 13:05:33 DENNI, [place] ([id number]) Alcorta, [name] ([id number]) -------------------------------------------------------------------------------- Vitals Details Patient Name: TONEA, LEIPHART. Date of Service: 06/15/2020 12:30 PM Medical Record Number: [id number] Patient Account Number: [id number] Date of Birth/Sex: 03/19/1946 (74 y.o. F) Treating RN: [name] Canavan Primary Care Kelcey Wickstrom: [name] Other Clinician: Referring Alysah Carton: [name] Treating Ramani Riva/Extender: Beverly Gust in Treatment:  79 Vital Signs Time Taken: 12:41 Temperature (F): 98.1 Height (in): 63 Pulse (bpm): 73 Weight (lbs): 224.7 Respiratory Rate (breaths/min): 14 Body Mass Index (BMI): 39.8 Blood Pressure (mmHg): 154/82 Reference Range: 80 - 120 mg / dl Electronic Signature(s) Signed: 06/15/2020 4:18:47 PM By: [name] Canavan Entered By: [name] Canavan on 06/15/2020 12:47:38

## 2020-06-17 ENCOUNTER — Telehealth: Payer: Self-pay | Admitting: Family Medicine

## 2020-06-17 ENCOUNTER — Ambulatory Visit: Payer: 59

## 2020-06-17 MED ORDER — FUROSEMIDE 40 MG PO TABS: 40.0000 mg | ORAL_TABLET | Freq: Two times a day (BID) | ORAL | 6 refills | Status: DC | PRN

## 2020-06-17 NOTE — Telephone Encounter (Signed)
Lvm asking pt to call back.  Need to relay Dr. G's message.  

## 2020-06-17 NOTE — Telephone Encounter (Signed)
Pt said her legs are swelling again and the wound center wants her to double up on Lasix for 2 weeks. She said she will need a new prescription in order for her not to run out. Please send to [facility].

## 2020-06-17 NOTE — Addendum Note (Signed)
Addended by: [name] on: 06/17/2020 01:46 PM   Modules accepted: Orders

## 2020-06-17 NOTE — Telephone Encounter (Addendum)
I've sent in new sig for her.  Did they recommend increased potassium while on higher lasix dose? May recommend she increase to TID when she takes second lasix dose.

## 2020-06-20 ENCOUNTER — Other Ambulatory Visit: Payer: Self-pay

## 2020-06-20 DIAGNOSIS — I87332 Chronic venous hypertension (idiopathic) with ulcer and inflammation of left lower extremity: Secondary | ICD-10-CM | POA: Diagnosis not present

## 2020-06-20 DIAGNOSIS — L97221 Non-pressure chronic ulcer of left calf limited to breakdown of skin: Secondary | ICD-10-CM | POA: Diagnosis not present

## 2020-06-20 DIAGNOSIS — I890 Lymphedema, not elsewhere classified: Secondary | ICD-10-CM | POA: Diagnosis not present

## 2020-06-20 DIAGNOSIS — I10 Essential (primary) hypertension: Secondary | ICD-10-CM | POA: Diagnosis not present

## 2020-06-20 DIAGNOSIS — I82592 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: Secondary | ICD-10-CM | POA: Diagnosis not present

## 2020-06-20 DIAGNOSIS — J45909 Unspecified asthma, uncomplicated: Secondary | ICD-10-CM | POA: Diagnosis not present

## 2020-06-20 NOTE — Telephone Encounter (Addendum)
Spoke with pt relaying Dr. Synthia Innocent message.  Pt verbalizes understanding.  She confirms it was recommended she take potassium while on higher Lasix dose.  Says she is taking TID when taking 2nd Lasix dose.  FYI to Dr. Darnell Level.

## 2020-06-20 NOTE — Telephone Encounter (Signed)
Lvm asking pt to call back.  Need to relay Dr. G's message.  

## 2020-06-20 NOTE — Progress Notes (Signed)
BEKKI, TAVENNER ([id number]) Visit Report for 06/20/2020 Arrival Information Details Patient Name: JAKLYN, ALEN. Date of Service: 06/20/2020 11:15 AM Medical Record Number: [id number] Patient Account Number: [id number] Date of Birth/Sex: [birth year] (74 y.o. F) Treating RN: [name] Canavan Primary Care Savita Runner: [name] Other Clinician: Referring Donoven Pett: [name] Treating Zaire Levesque/Extender: Melburn Hake, HOYT Weeks in Treatment: 79 Visit Information History Since Last Visit Added or deleted any medications: No Patient Arrived: Ambulatory Had a fall or experienced change in No Arrival Time: 11:20 activities of daily living that may affect Accompanied By: self risk of falls: Transfer Assistance: None Hospitalized since last visit: No Patient Requires Transmission-Based No Pain Present Now: Yes Precautions: Patient Has Alerts: Yes Patient Alerts: Patient on Blood Thinner aspirin 81 Electronic Signature(s) Signed: 06/20/2020 4:17:57 PM By: [name] Canavan Entered By: [name] Canavan on 06/20/2020 11:21:45 Shvartsman, [name] ([id number]) -------------------------------------------------------------------------------- Compression Therapy Details Patient Name: EARSIE, HUMM. Date of Service: 06/20/2020 11:15 AM Medical Record Number: [id number] Patient Account Number: [id number] Date of Birth/Sex: 1945/11/22 (74 y.o. F) Treating RN: [name] Canavan Primary Care Genesi Stefanko: [name] Other Clinician: Referring Marien Manship: [name] Treating Donta Fuster/Extender: Melburn Hake, HOYT Weeks in Treatment: 79 Compression Therapy Performed for Wound Assessment: Wound #5 Left,Medial Lower Leg Performed By: Clinician [name] Canavan, RN Compression Type: Four Layer Electronic Signature(s) Signed: 06/20/2020 4:17:57 PM By: [name] Canavan Entered By: [name] Canavan on 06/20/2020 11:37:23 Atchley, [name]  ([id number]) -------------------------------------------------------------------------------- Compression Therapy Details Patient Name: SKYLYNNE, SCHLECHTER. Date of Service: 06/20/2020 11:15 AM Medical Record Number: [id number] Patient Account Number: [id number] Date of Birth/Sex: [birth year] (74 y.o. F) Treating RN: [name] Canavan Primary Care Lundon Verdejo: [name] Other Clinician: Referring Laxmi Choung: [name] Treating Itha Kroeker/Extender: Melburn Hake, HOYT Weeks in Treatment: 79 Compression Therapy Performed for Wound Assessment: Wound #12 Left,Lateral Lower Leg Performed By: Ponciano Ort, RN Compression Type: Four Layer Electronic Signature(s) Signed: 06/20/2020 4:17:57 PM By: [name] Canavan Entered By: [name] Canavan on 06/20/2020 11:37:23 Sukhu, [name] ([id number]) -------------------------------------------------------------------------------- Encounter Discharge Information Details Patient Name: KEAH, LAMBA. Date of Service: 06/20/2020 11:15 AM Medical Record Number: [id number] Patient Account Number: [id number] Date of Birth/Sex: [birth year] (74 y.o. F) Treating RN: [name] Canavan Primary Care Blakelee Allington: [name] Other Clinician: Referring Zuly Belkin: [name] Treating Phu Record/Extender: Melburn Hake, HOYT Weeks in Treatment: 59 Encounter Discharge Information Items Discharge Condition: Stable Ambulatory Status: Ambulatory Discharge Destination: Home Transportation: Private Auto Accompanied By: self Schedule Follow-up Appointment: Yes Clinical Summary of Care: Electronic Signature(s) Signed: 06/20/2020 4:17:57 PM By: [name] Canavan Entered By: [name] Canavan on 06/20/2020 11:38:19 Tuft, [name] ([id number]) -------------------------------------------------------------------------------- Wound Assessment Details Patient Name: AIYANNA, AWTREY. Date of Service: 06/20/2020 11:15 AM Medical Record Number: [id number] Patient  Account Number: [id number] Date of Birth/Sex: 1945/12/25 (74 y.o. F) Treating RN: [name] Canavan Primary Care Vanesa Renier: [name] Other Clinician: Referring Caliegh Middlekauff: [name] Treating Kloi Brodman/Extender: Melburn Hake, HOYT Weeks in Treatment: 56 Wound Status Wound Number: 12 Primary Venous Leg Ulcer Etiology: Wound Location: Left, Lateral Lower Leg Wound Open Wounding Event: Gradually Appeared Status: Date Acquired: 06/15/2020 Comorbid Cataracts, Asthma, Sleep Apnea, Deep Vein Thrombosis, Weeks Of Treatment: 0 History: Hypertension, Peripheral Venous Disease, Osteoarthritis, Clustered Wound: No Received Chemotherapy, Received Radiation Photos Wound Measurements Length: (cm) 1.5 Width: (cm) 1 Depth: (cm) 0.1 Area: (cm) 1.178 Volume: (cm) 0.118 % Reduction in Area: -108.5% % Reduction in Volume: -107% Epithelialization: None Tunneling: No Undermining: No Wound Description Classification: Partial Thickness Wound Margin: Flat and Intact Exudate Amount: Medium Exudate Type: Serous Exudate Color:  amber Foul Odor After Cleansing: No Slough/Fibrino No Wound Bed Granulation Amount: Large (67-100%) Exposed Structure Granulation Quality: Red Fascia Exposed: No Necrotic Amount: None Present (0%) Fat Layer (Subcutaneous Tissue) Exposed: Yes Tendon Exposed: No Muscle Exposed: No Joint Exposed: No Bone Exposed: No Treatment Notes Wound #12 (Left, Lateral Lower Leg) Electronic Signature(s) Signed: 06/20/2020 4:17:57 PM By: [name] Canavan Entered By: [name] Canavan on 06/20/2020 11:35:32 Delamar, NISHIKA PARKHURST ([id number]) Francom, [name] ([id number]) -------------------------------------------------------------------------------- Wound Assessment Details Patient Name: AMARRIA, ANDREASEN. Date of Service: 06/20/2020 11:15 AM Medical Record Number: [id number] Patient Account Number: [id number] Date of Birth/Sex: [birth year] (74 y.o. F) Treating RN: [name] Canavan Primary Care Udell Mazzocco: [name] Other Clinician: Referring Meika Earll: [name] Treating Faust Thorington/Extender: Melburn Hake, HOYT Weeks in Treatment: 75 Wound Status Wound Number: 5 Primary Lymphedema Etiology: Wound Location: Left, Medial Lower Leg Wound Open Wounding Event: Gradually Appeared Status: Date Acquired: 11/19/2018 Comorbid Cataracts, Asthma, Sleep Apnea, Deep Vein Thrombosis, Weeks Of Treatment: 79 History: Hypertension, Peripheral Venous Disease, Osteoarthritis, Clustered Wound: No Received Chemotherapy, Received Radiation Photos Wound Measurements Length: (cm) 2.8 Width: (cm) 7.5 Depth: (cm) 0.1 Area: (cm) 16.493 Volume: (cm) 1.649 % Reduction in Area: -93% % Reduction in Volume: -92.9% Epithelialization: None Tunneling: No Undermining: No Wound Description Classification: Full Thickness Without Exposed Support Structu Wound Margin: Flat and Intact Exudate Amount: Medium Exudate Type: Serosanguineous Exudate Color: red, brown res Foul Odor After Cleansing: No Slough/Fibrino Yes Wound Bed Granulation Amount: Small (1-33%) Granulation Quality: Red Necrotic Amount: Large (67-100%) Necrotic Quality: Adherent Slough Treatment Notes Wound #5 (Left, Medial Lower Leg) Electronic Signature(s) Signed: 06/20/2020 4:17:57 PM By: [name] Canavan Entered By: [name] Canavan on 06/20/2020 11:36:25

## 2020-06-21 ENCOUNTER — Other Ambulatory Visit: Payer: Self-pay

## 2020-06-21 ENCOUNTER — Other Ambulatory Visit: Payer: Self-pay | Admitting: Neurological Surgery

## 2020-06-22 ENCOUNTER — Other Ambulatory Visit: Payer: Self-pay

## 2020-06-22 ENCOUNTER — Other Ambulatory Visit: Payer: Self-pay | Admitting: Neurological Surgery

## 2020-06-22 ENCOUNTER — Encounter: Payer: Medicare Other | Admitting: Internal Medicine

## 2020-06-22 DIAGNOSIS — I10 Essential (primary) hypertension: Secondary | ICD-10-CM | POA: Diagnosis not present

## 2020-06-22 DIAGNOSIS — I87332 Chronic venous hypertension (idiopathic) with ulcer and inflammation of left lower extremity: Secondary | ICD-10-CM | POA: Diagnosis not present

## 2020-06-22 DIAGNOSIS — I82592 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: Secondary | ICD-10-CM | POA: Diagnosis not present

## 2020-06-22 DIAGNOSIS — L97221 Non-pressure chronic ulcer of left calf limited to breakdown of skin: Secondary | ICD-10-CM | POA: Diagnosis not present

## 2020-06-22 DIAGNOSIS — J45909 Unspecified asthma, uncomplicated: Secondary | ICD-10-CM | POA: Diagnosis not present

## 2020-06-22 DIAGNOSIS — I890 Lymphedema, not elsewhere classified: Secondary | ICD-10-CM | POA: Diagnosis not present

## 2020-06-24 ENCOUNTER — Other Ambulatory Visit: Payer: Self-pay

## 2020-06-24 DIAGNOSIS — I87332 Chronic venous hypertension (idiopathic) with ulcer and inflammation of left lower extremity: Secondary | ICD-10-CM | POA: Diagnosis not present

## 2020-06-24 DIAGNOSIS — I890 Lymphedema, not elsewhere classified: Secondary | ICD-10-CM | POA: Diagnosis not present

## 2020-06-24 DIAGNOSIS — I82592 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: Secondary | ICD-10-CM | POA: Diagnosis not present

## 2020-06-24 DIAGNOSIS — J45909 Unspecified asthma, uncomplicated: Secondary | ICD-10-CM | POA: Diagnosis not present

## 2020-06-24 DIAGNOSIS — L97221 Non-pressure chronic ulcer of left calf limited to breakdown of skin: Secondary | ICD-10-CM | POA: Diagnosis not present

## 2020-06-24 DIAGNOSIS — I10 Essential (primary) hypertension: Secondary | ICD-10-CM | POA: Diagnosis not present

## 2020-06-24 NOTE — Progress Notes (Signed)
IVEY, NEMBHARD ([id number]) Visit Report for 06/22/2020 Arrival Information Details Patient Name: LARENDA, [name]. Date of Service: 06/22/2020 2:15 PM Medical Record Number: [id number] Patient Account Number: [id number] Date of Birth/Sex: [birth year] (74 y.o. F) Treating RN: [name] Primary Care [name]: [name] Other Clinician: Referring [name]: [name] Treating [name]/Extender: [name] in Treatment: 62 Visit Information History Since Last Visit All ordered tests and consults were completed: No Patient Arrived: Ambulatory Added or deleted any medications: No Arrival Time: 14:42 Any new allergies or adverse reactions: No Accompanied By: self Had a fall or experienced change in No Transfer Assistance: None activities of daily living that may affect Patient Identification Verified: Yes risk of falls: Secondary Verification Process Completed: Yes Signs or symptoms of abuse/neglect since last visito No Patient Requires Transmission-Based No Hospitalized since last visit: No Precautions: Implantable device outside of the clinic excluding No Patient Has Alerts: Yes cellular tissue based products placed in the center Patient Alerts: Patient on Blood since last visit: Thinner Has Dressing in Place as Prescribed: Yes aspirin 81 Has Compression in Place as Prescribed: Yes Pain Present Now: No Electronic Signature(s) Signed: 06/23/2020 10:49:47 AM By: [name] Entered By: [name] on 06/22/2020 14:43:10 [name], [name] ([id number]) -------------------------------------------------------------------------------- Encounter Discharge Information Details Patient Name: Amber, [name]. Date of Service: 06/22/2020 2:15 PM Medical Record Number: [id number] Patient Account Number: [id number] Date of Birth/Sex: 1946-08-20 (74 y.o. F) Treating RN: [name] Primary Care Quran Vasco: [name] Other Clinician: Referring Rudie Rikard:  [name] Treating [name]/Extender: [name] in Treatment: 59 Encounter Discharge Information Items Discharge Condition: Stable Ambulatory Status: Ambulatory Discharge Destination: Home Transportation: Private Auto Schedule Follow-up Appointment: Yes Clinical Summary of Care: Electronic Signature(s) Signed: 06/23/2020 6:39:29 PM By: Amber Cool, [name], RN, CWS, [name], [name] Entered By: [name] on 06/22/2020 14:58:51 [name] ([id number]) -------------------------------------------------------------------------------- Lower Extremity Assessment Details Patient Name: Amber, [name]. Date of Service: 06/22/2020 2:15 PM Medical Record Number: [id number] Patient Account Number: [id number] Date of Birth/Sex: January 24, 1946 (74 y.o. F) Treating RN: [name] Primary Care Fiorela Pelzer: [name] Other Clinician: Referring Zierra Laroque: [name] Treating Alani Lacivita/Extender: [name] in Treatment: 80 Edema Assessment Assessed: [Left: No] [Right: No] [Left: Edema] [Right: :] Calf Left: Right: Point of Measurement: 53 cm From Medial Instep 48 cm cm Ankle Left: Right: Point of Measurement: 22.5 cm From Medial Instep 23.5 cm cm Vascular Assessment Pulses: Dorsalis Pedis Palpable: [Left:Yes] Posterior Tibial Palpable: [Left:Yes] Electronic Signature(s) Signed: 06/23/2020 10:49:47 AM By: [name] Signed: 06/23/2020 6:39:29 PM By: Amber Cool, [name], RN, CWS, [name], [name] Entered By: [name] on 06/22/2020 14:46:59 [name], [name] ([id number]) -------------------------------------------------------------------------------- Multi Wound Chart Details Patient Name: Amber, [name]. Date of Service: 06/22/2020 2:15 PM Medical Record Number: [id number] Patient Account Number: [id number] Date of Birth/Sex: 02-Oct-1946 (74 y.o. F) Treating RN: [name] Primary Care Chase Arnall: [name] Other Clinician: Referring Elie Gragert:  [name] Treating Maysen Bonsignore/Extender: [name] in Treatment: 80 Vital Signs Height(in): 63 Pulse(bpm): 80 Weight(lbs): 224.7 Blood Pressure(mmHg): 160/85 Body Mass Index(BMI): 40 Temperature(F): 98.3 Respiratory Rate(breaths/min): 18 Photos: [N/A:N/A] Wound Location: Left, Lateral Lower Leg Left, Medial Lower Leg N/A Wounding Event: Gradually Appeared Gradually Appeared N/A Primary Etiology: Venous Leg Ulcer Lymphedema N/A Comorbid History: Cataracts, Asthma, Sleep Apnea, Cataracts, Asthma, Sleep Apnea, N/A Deep Vein Thrombosis, Deep Vein Thrombosis, Hypertension, Peripheral Venous Hypertension, Peripheral Venous Disease, Osteoarthritis, Received Disease, Osteoarthritis, Received Chemotherapy, Received Radiation Chemotherapy, Received Radiation Date Acquired:  06/15/2020 11/19/2018 N/A Weeks of Treatment: 1 80 N/A Wound Status: Open Open N/A Measurements L x W x D (cm) 2x1.2x0.1 2.2x5.2x0.1 N/A Area (cm) : 1.885 8.985 N/A Volume (cm) : 0.188 0.898 N/A % Reduction in Area: -233.60% -5.10% N/A % Reduction in Volume: -229.80% -5.00% N/A Classification: Partial Thickness Full Thickness Without Exposed N/A Support Structures Exudate Amount: Medium Medium N/A Exudate Type: Serous Serosanguineous N/A Exudate Color: amber red, brown N/A Wound Margin: Flat and Intact Flat and Intact N/A Granulation Amount: Large (67-100%) Small (1-33%) N/A Granulation Quality: Red Red N/A Necrotic Amount: None Present (0%) Large (67-100%) N/A Exposed Structures: Fat Layer (Subcutaneous Tissue) N/A N/A Exposed: Yes Fascia: No Tendon: No Muscle: No Joint: No Bone: No Epithelialization: None None N/A Treatment Notes Wound #12 (Left, Lateral Lower Leg) Notes Silver Cell, ABD, 3 layer left [name], [name] ([id number]) Wound #5 (Left, Medial Lower Leg) Notes Silver Cell, ABD, 3 layer left Electronic Signature(s) Signed: 06/22/2020 4:48:02 PM By: Linton Ham  MD Entered By: Linton [name] on 06/22/2020 15:04:32 [name], [name] ([id number]) -------------------------------------------------------------------------------- Multi-Disciplinary Care Plan Details Patient Name: Amber, [name]. Date of Service: 06/22/2020 2:15 PM Medical Record Number: [id number] Patient Account Number: [id number] Date of Birth/Sex: [birth year] (74 y.o. F) Treating RN: [name] Primary Care Elex Mainwaring: [name] Other Clinician: Referring Sandara Tyree: [name] Treating Zamia Tyminski/Extender: [name] in Treatment: 80 Active Inactive Medication Nursing Diagnoses: Knowledge deficit related to medication safety: actual or potential Goals: Patient/caregiver will demonstrate understanding of new oral/IV medications prescribed at the [facility] (topical prescriptions are covered under the skin breakdown problem) Date Initiated: 12/16/2019 Target Resolution Date: 01/13/2020 Goal Status: Active Interventions: Assess for medication contraindications each visit where new medications are prescribed Treatment Activities: New medication prescribed at [place] : 12/16/2019 Notes: Soft Tissue Infection Nursing Diagnoses: Impaired tissue integrity Goals: Patient's soft tissue infection will resolve Date Initiated: 12/10/2018 Target Resolution Date: 01/09/2019 Goal Status: Active Interventions: Assess signs and symptoms of infection every visit Notes: Venous Leg Ulcer Nursing Diagnoses: Actual venous Insuffiency (use after diagnosis is confirmed) Goals: Patient will maintain optimal edema control Date Initiated: 12/10/2018 Target Resolution Date: 01/09/2019 Goal Status: Active Interventions: Assess peripheral edema status every visit. Treatment Activities: Therapeutic compression applied : 12/10/2018 Notes: Wound/Skin Impairment Nursing Diagnoses: Amber, [name] ([id number]) Impaired tissue integrity Goals: Patient/caregiver will verbalize  understanding of skin care regimen Date Initiated: 12/10/2018 Target Resolution Date: 01/09/2019 Goal Status: Active Interventions: Assess ulceration(s) every visit Treatment Activities: Topical wound management initiated : 12/10/2018 Notes: Electronic Signature(s) Signed: 06/23/2020 6:39:29 PM By: Amber Cool, [name], RN, CWS, [name], [name] Entered By: [name] on 06/22/2020 14:54:16 Birdsall, [name] ([id number]) -------------------------------------------------------------------------------- Pain Assessment Details Patient Name: Amber, [name]. Date of Service: 06/22/2020 2:15 PM Medical Record Number: [id number] Patient Account Number: [id number] Date of Birth/Sex: [name] 26, 1946 (74 y.o. F) Treating RN: [name] Primary Care Nakesha Ebrahim: [name] Other Clinician: Referring Laquonda Welby: [name] Treating Bowie Doiron/Extender: [name] in Treatment: 80 Active Problems Location of Pain Severity and Description of Pain Patient Has Paino Yes Site Locations Pain Location: Pain in Ulcers With Dressing Change: Yes Duration of the Pain. Constant / Intermittento Intermittent Rate the pain. Current Pain Level: 5 Worst Pain Level: 8 Least Pain Level: 1 Tolerable Pain Level: 2 Character of Pain Describe the Pain: Shooting, Tender Pain Management and Medication Current Pain Management: Notes States very painful with dressing changes. Electronic Signature(s) Signed: 06/23/2020 10:49:47 AM By: [name] Signed: 06/23/2020 6:39:29  PM By: Amber Cool, [name], RN, CWS, [name], [name] Entered By: [name] on 06/22/2020 14:45:11 [name] ([id number]) -------------------------------------------------------------------------------- Patient/Caregiver Education Details Patient Name: BRIYA, LOOKABAUGH. Date of Service: 06/22/2020 2:15 PM Medical Record Number: [id number] Patient Account Number: [id number] Date of Birth/Gender: 1946-07-25 (74 y.o. F) Treating RN:  [name] Primary Care Physician: [name] Other Clinician: Referring Physician: Ria [name] Treating Physician/Extender: [name] in Treatment: 9 Education Assessment Education Provided To: Patient Education Topics Provided Venous: Handouts: Controlling Swelling with Compression Stockings , Controlling Swelling with Multilayered Compression Wraps Methods: Explain/Verbal Responses: State content correctly Electronic Signature(s) Signed: 06/23/2020 6:39:29 PM By: Amber Cool, [name], RN, CWS, [name], [name] Entered By: [name] on 06/22/2020 14:57:35 Park, [name] ([id number]) -------------------------------------------------------------------------------- Wound Assessment Details Patient Name: Amber, [name]. Date of Service: 06/22/2020 2:15 PM Medical Record Number: [id number] Patient Account Number: [id number] Date of Birth/Sex: 1946-01-08 (75 y.o. F) Treating RN: [name] Primary Care Xiong Haidar: [name] Other Clinician: Referring Darin [name]: [name] Treating Kayline Sheer/Extender: [name] in Treatment: 30 Wound Status Wound Number: 12 Primary Venous Leg Ulcer Etiology: Wound Location: Left, Lateral Lower Leg Wound Open Wounding Event: Gradually Appeared Status: Date Acquired: 06/15/2020 Comorbid Cataracts, Asthma, Sleep Apnea, Deep Vein Thrombosis, Weeks Of Treatment: 1 History: Hypertension, Peripheral Venous Disease, Osteoarthritis, Clustered Wound: No Received Chemotherapy, Received Radiation Photos Wound Measurements Length: (cm) 2 Width: (cm) 1.2 Depth: (cm) 0.1 Area: (cm) 1.885 Volume: (cm) 0.188 % Reduction in Area: -233.6% % Reduction in Volume: -229.8% Epithelialization: None Wound Description Classification: Partial Thickness Wound Margin: Flat and Intact Exudate Amount: Medium Exudate Type: Serous Exudate Color: amber Foul Odor After Cleansing: No Slough/Fibrino No Wound  Bed Granulation Amount: Large (67-100%) Exposed Structure Granulation Quality: Red Fascia Exposed: No Necrotic Amount: None Present (0%) Fat Layer (Subcutaneous Tissue) Exposed: Yes Tendon Exposed: No Muscle Exposed: No Joint Exposed: No Bone Exposed: No Treatment Notes Wound #12 (Left, Lateral Lower Leg) Notes Silver Cell, ABD, 3 layer left Electronic Signature(s) Amber, [name] ([id number]) Signed: 06/23/2020 10:49:47 AM By: [name] Signed: 06/23/2020 6:39:29 PM By: Amber Cool, [name], RN, CWS, [name], [name] Entered By: [name] on 06/22/2020 14:41:43 Marse, [name] ([id number]) -------------------------------------------------------------------------------- Wound Assessment Details Patient Name: Amber, [name]. Date of Service: 06/22/2020 2:15 PM Medical Record Number: [id number] Patient Account Number: [id number] Date of Birth/Sex: [birth year] (74 y.o. F) Treating RN: [name] Primary Care Antionette [name]: [name] Other Clinician: Referring Vernard Gram: [name] Treating Milee Qualls/Extender: [name] in Treatment: 86 Wound Status Wound Number: 5 Primary Lymphedema Etiology: Wound Location: Left, Medial Lower Leg Wound Open Wounding Event: Gradually Appeared Status: Date Acquired: 11/19/2018 Comorbid Cataracts, Asthma, Sleep Apnea, Deep Vein Thrombosis, Weeks Of Treatment: 80 History: Hypertension, Peripheral Venous Disease, Osteoarthritis, Clustered Wound: No Received Chemotherapy, Received Radiation Photos Wound Measurements Length: (cm) 2.2 Width: (cm) 5.2 Depth: (cm) 0.1 Area: (cm) 8.985 Volume: (cm) 0.898 % Reduction in Area: -5.1% % Reduction in Volume: -5% Epithelialization: None Wound Description Classification: Full Thickness Without Exposed Support Struc Wound Margin: Flat and Intact Exudate Amount: Medium Exudate Type: Serosanguineous Exudate Color: red, brown tures Foul Odor After Cleansing: No Slough/Fibrino  Yes Wound Bed Granulation Amount: Small (1-33%) Granulation Quality: Red Necrotic Amount: Large (67-100%) Necrotic Quality: Adherent Slough Treatment Notes Wound #5 (Left, Medial Lower Leg) Notes Silver Cell, ABD, 3 layer left Electronic Signature(s) Signed: 06/23/2020 10:49:47 AM By: [name] Signed: 06/23/2020 6:39:29 PM By: Amber Cool, [name], RN, CWS, [name],  [name] Entered By: [name] on 06/22/2020 14:42:21 Amber, [name] ([id number]) Ferdinand Lango, [name] ([id number]) -------------------------------------------------------------------------------- [place] Details Patient Name: Amber, [name]. Date of Service: 06/22/2020 2:15 PM Medical Record Number: [id number] Patient Account Number: [id number] Date of Birth/Sex: Mar 27, 1946 (74 y.o. F) Treating RN: [name] Primary Care Rhandi Despain: [name] Other Clinician: Referring Ocean Schildt: [name] Treating Isaly Fasching/Extender: [name] in Treatment: 80 Vital Signs Time Taken: 02:30 Temperature (F): 98.3 Height (in): 63 Pulse (bpm): 80 Weight (lbs): 224.7 Respiratory Rate (breaths/min): 18 Body Mass Index (BMI): 39.8 Blood Pressure (mmHg): 160/85 Reference Range: 80 - 120 mg / dl Electronic Signature(s) Signed: 06/23/2020 10:49:47 AM By: [name] Entered By: [name] on 06/22/2020 14:43:40

## 2020-06-24 NOTE — Progress Notes (Signed)
[name], [name] ([id number]) Visit Report for 06/22/2020 HPI Details Patient Name: [name], [name]. Date of Service: 06/22/2020 2:15 PM Medical Record Number: [id number] Patient Account Number: [id number] Date of Birth/Sex: 06-15-1946 (74 y.o. F) Treating RN: Cornell Barman Primary Care Provider: [name] Other Clinician: Referring Provider: [name] Treating Provider/Extender: Tito Dine in Treatment: 6 History of Present Illness HPI Description: Pleasant 74 year old with history of chronic venous insufficiency. No diabetes or peripheral vascular disease. Left ABI 1.29. Questionable history of left lower extremity DVT. She developed a recurrent ulceration on her left lateral calf in December 2015, which she attributes to poor diet and subsequent lower extremity edema. She underwent endovenous laser ablation of her left greater saphenous vein in 2010. She underwent laser ablation of accessory branch of left GSV in April 2016 by Dr. Kellie Simmering at [facility]. She was previously wearing Unna boots, which she tolerated well. Tolerating 2 layer compression and cadexomer iodine. She returns to clinic for follow-up and is without new complaints. She denies any significant pain at this time. She reports persistent pain with pressure. No claudication or ischemic rest pain. No fever or chills. No drainage. READMISSION 11/13/16; this is a 74 year old woman who is not a diabetic. She is here for a review of a painful area on her left medial lower extremity. I note that she was seen here previously last year for wound I believe to be in the same area. At that time she had undergone previously a left greater saphenous vein ablation by Dr. Kellie Simmering and she had a ablation of the anterior accessory branch of the left greater saphenous vein in March 2016. Seeing that the wound actually closed over. In reviewing the history with her today the ulcer in this area has been recurrent. She describes  a biopsy of this area in 2009 that only showed stasis physiology. 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 Do not see that she has ever had arterial studies in the left leg. She tells me she has a follow-up with Dr. Kellie Simmering in roughly 10 days In any case she developed the reopening of this area roughly a month ago. On the background of this she describes rapidly increasing edema which has responded to Lasix 40 mg and metolazone 2.5 mg as well as the patient's lymph massage. She has been told she has both venous insufficiency and lymphedema but she cannot tolerate compression stockings 11/28/16; the patient saw Dr. Kellie Simmering recently. Per the patient he did arterial Dopplers in the office that did not show evidence of arterial insufficiency, per the patient he stated "treat this like an ordinary venous ulcer". She also saw her dermatologist Dr. Ronnald Ramp who felt that this was more of a vascular ulcer. In general things are improving although she arrives today with increasing bilateral lower extremity edema with weeping a deeper fluid through the wound on the left medial leg compatible with some degree of lymphedema 12/04/16; the patient's wound is fully epithelialized but I don't think fully healed. We will do another week of depression with Promogran and TCA however I suspect we'll be able to discharge her next week. This is a very unusual-looking wound which was initially a figure-of-eight type wound lying on its side surrounded by petechial like hemorrhage. She  has had venous ablation on this side. She apparently does not have an arterial issue per Dr. Kellie Simmering. She saw her dermatologist thought it was "vascular". Patient is definitely going to need ongoing compression and I talked about  this with her today she will go to elastic therapy after she leaves here next week 12/11/16; the patient's wound is not completely closed today. She has surrounding scar tissue and in further discussion with the patient it would appear that she had ulcers in this area in 2009 for a prolonged period of time ultimately requiring a punch biopsy of this area that only showed venous insufficiency. I did not previously pickup on this part of the history from the patient. 12/18/16; the patient's wound is completely epithelialized. There is no open area here. She has significant bilateral venous insufficiency with secondary lymphedema to a mild-to-moderate degree she does not have compression stockings.. She did not say anything to me when I was in the room, she told our intake nurse that she was still having pain in this area. This isn't unusual recurrent small open area. She is going to go to elastic therapy to obtain compression stockings. 12/25/16; the patient's wound is fully epithelialized. There is no open area here. The patient describes some continued episodic discomfort in this area medial left calf. However everything looks fine and healed here. She is been to elastic therapy and caught herself 15-20 mmHg stockings, they apparently were having trouble getting 20-30 mm stockings in her size 01/22/17; this is a patient we discharged from the clinic a month ago. She has a recurrent open wound on her medial left calf. She had 15 mm support stockings. I told her I thought she needed 20-30 mm compression stockings. She tells me that she has been ill with hospitalization secondary to asthma and is been found to have severe hypokalemia likely secondary to a combination of Lasix and metolazone. This morning she noted blistering and leaking fluid on the posterior part of her left leg. She called our intake nurse urgently and we was saw her this afternoon. She has not had any real discomfort here. I don't know  that she's been wearing any stockings on this leg for at least 2-3 days. ABIs in this clinic were 1.21 on the right and 1.3 on the left. She is previously seen vascular surgery who does not think that there is a peripheral arterial issue. 01/30/17; Patient arrives with no open wound on the left leg. She has been to elastic therapy and obtained 20-14mmhg below knee stockings and she has one on the right leg today. READMISSION 02/19/18; this Cirrincione is a now 74 year old patient we've had in this clinic perhaps 3 times before. I had last looked at her from January 07 December 2016 with an area on the medial left leg. We discharged her on 12/25/16 however she had to be readmitted on 01/22/17 with a recurrence. I have in my notes that we discharged her on 20-30 mm stockings although she tells me she was only wearing support hose because she cannot get stockings on predominantly related to her cervical spine surgery/issues. She has had previous ablations done by vein and vascular in [place] including a great saphenous vein ablation on the left with an anterior accessory branch ablation I think both of these were in 2016. On one of the previous visit she had a biopsy noted 2009 that was negative. She is not felt to have an arterial issue. She is not a diabetic. She does  have a history of obstructive sleep apnea hypertension asthma as well as chronic venous insufficiency and lymphedema. On this occasion she noted 2 dry scaly patch on her left leg. She tried to put lotion on this it didn't really help. There were 2 open areas.the JAMERIA, BRADWAY ([id number]) patient has been seeing her primary physician from 02/05/18 through 02/14/18. She had Unna boots applied. The superior wound now on the lateral left leg has closed but she's had one wound that remains open on the lateral left leg. This is not the same spot as we dealt with in 2018. ABIs in this clinic were 1.3 bilaterally 02/26/18; patient has a small wound on  the left lateral calf. Dimensions are down. She has chronic venous insufficiency and lymphedema. 03/05/18; small open area on the left lateral calf. Dimensions are down. Tightly adherent necrotic debris over the surface of the wound which was difficult to remove. Also the dressing [over collagen] stuck to the wound surface. This was removed with some difficulty as well. Change the primary dressing to Hydrofera Blue ready 03/12/18; small open area on the left lateral calf. Comes in with tightly adherent surface eschar as well as some adherent Hydrofera Blue. 03/19/18; open area on the left lateral calf. Again adherent surface eschar as well as some adherent Hydrofera Blue nonviable subcutaneous tissue. She complained of pain all week even with the reduction from 4-3 layer compression I put on last week. Also she had an increase in her ankle and calf measurements probably related to the same thing. 03/26/18; open area on the left lateral calf. A very small open area remains here. We used silver alginate starting last week as the Hydrofera Blue seem to stick to the wound bed. In using 4-layer compression 04/02/18; the open area in the left lateral calf at some adherent slough which I removed there is no open area here. We are able to transition her into her own compression stocking. Truthfully I think this is probably his support hose. However this does not maintain skin integrity will be limited. She cannot put over the toe compression stockings on because of neck problems hand problems etc. She is allergic to the lining layer of juxta lites. We might be forced to use extremitease stocking should this fail READMIT 11/24/2018 Patient is now a 74 year old woman who is not a diabetic. She has been in this clinic on at least 3 previous occasions largely with recurrent wounds on her left leg secondary to chronic venous insufficiency with secondary lymphedema. Her situation is complicated by inability to  get stockings on and an allergy to neoprene which is apparently a component and at least juxta lites and other stockings. As a result she really has not been wearing any stockings on her legs. She tells [place] that roughly 2 or 3 weeks ago she started noticing a stinging sensation just above her ankle on the left medial aspect. She has been diagnosed with pseudogout and she wondered whether this was what she was experiencing. She tried to dress this with something she bought at the store however subsequently it pulled skin off and now she has an open wound that is not improving. She has been using Vaseline gauze with a cover bandage. She saw her primary doctor last week who put an Haematologist on her. ABIs in this clinic was 1.03 on the left 2/12; the area is on the left medial ankle. Odd-looking wound with what looks to be surface epithelialization but a multitude of  small petechial openings. This clearly not closed yet. We have been using silver alginate under 3 layer compression with TCA 2/19; the wound area did not look quite as good this week. Necrotic debris over the majority of the wound surface which required debridement. She continues to have a multitude of what looked to be small petechial openings. She reminds [place] that she had a biopsy on this initially during her first outbreak in 2015 in [place] [facility]. She expresses concern about this being a possible melanoma. 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 I assured her that this does not look anything like melanoma. Except for the petechial reaction it does look like a venous insufficiency area and she certainly has evidence of this on both sides 2/26; a difficult area on the left medial ankle. The patient clearly has chronic venous hypertension with some degree of lymphedema. The odd thing about the area is the small petechial hemorrhages. I am not  really sure how to explain this. This was present last time and this is not a compression injury. We have been using Hydrofera Blue which I changed to last week 3/4; still using Hydrofera Blue. Aggressive debridement today. She does not have known arterial issues. She has seen Dr. Kellie Simmering at [facility] vein [facility] vascular and and has an ablation on the left. [Anterior accessory branch of the greater saphenous]. From what I remember they did not feel she had an arterial issue. The patient has had this area biopsied in 2009 at [facility] dermatology and by her recollection they said this was "stasis". She is also follow-up with dermatology locally who thought that this was more of a vascular issue 3/11; using Hydrofera Blue. Aggressive debridement today. She does not have an arterial issue. We are using 3 layer compression although we may need to go to 4. The patient has been in for multiple changes to her wrap since I last saw her a week ago. She says that the area was leaking. I do not have too much more information on what was found 01/19/19 on evaluation today patient was actually being seen for a nurse visit when unfortunately she had the area on her left lateral lower extremity as well as weeping from the right lower extremity that became apparent. Therefore we did end up actually seeing her for a full visit with myself. She is having some pain at this site as well but fortunately nothing too significant at this point. No fevers, chills, nausea, or vomiting noted at this time. 3/18-Patient is back to the clinic with the left leg venous leg ulcer, the ulcer is larger in size, has a surface that is densely adherent with fibrinous tissue, the Hydrofera Blue was used but is densely adherent and there was difficulty in removing it. The right lower extremity was also wrapped for weeping edema. Patient has a new area over the left lateral foot above the malleolus that is small and appears to have no debris  with intact surrounding skin. Patient is on increased dose of Lasix also as a means to edema management 3/25; the patient has a nonhealing venous ulcer on the medial left leg and last week developed a smaller area on the lateral left calf. We have been using Hydrofera Blue with a contact layer. 4/1; no major change in these wounds areas. Left medial and more recently left lateral calf. I tried Iodoflex last week to aid in debridement  she did not tolerate this. She stated her pain was terrible all week. She took the top layer of the 4 layer compression off. 4/8; the patient actually looks somewhat better in terms of her more prominent left lateral calf wound. There is some healthy looking tissue here. She is still complaining of a lot of discomfort. 4/15; patient in a lot of pain secondary to sciatica. She is on a prednisone taper prescribed by her primary physician. She has the 2 areas one on the left medial and more recently a smaller area on the left lateral calf. Both of these just above the malleoli 4/22; her back pain is better but she still states she is very uncomfortable and now feels she is intolerant to the [employer]. No real change in the wounds we have been using Sorbact. She has been previously intolerant to Iodoflex. There is not a lot of option about what we can use to debride this wound under compression that she no doubt needs. sHe states Ultram no longer works for her pain 4/29; no major change in the wounds slightly increased depth. Surface on the original medial wound perhaps somewhat improved however the more recent area on the lateral left ankle is 100% covered in very adherent debris we have been using Sorbact. She tolerates 4 layer compression well and her edema control is a lot better. She has not had to come in for a nurse check 5/6; no major change in the condition of the wounds. She did consent to debridement today which was done with some difficulty. Continuing Sorbact.  She did not tolerate Iodoflex. She was in for a check of her compression the day after we wrapped her last week this was adjusted but nothing much was found 5/13; no major change in the condition or area of the wounds. I was able to get a fairly aggressive debridement done on the lateral left leg wound. Even using Sorbact under compression. She came back in on Friday to have the wrap changed. She says she felt uncomfortable on the [name], [name]. ([id number]) lateral aspect of her ankle. She has a long history of chronic venous insufficiency including previous ablation surgery on this side. 5/20-Patient returns for wounds on left leg with both wounds covered in slough, with the lateral leg wound larger in size, she has been in 3 layer compression and felt more comfortable, she describes pain in ankle, in leg and pins and needles in foot, and is about to try Pamelor for this 6/3; wounds on the left lateral and left medial leg. The area medially which is the most recent of the 2 seems to have had the largest increase in dimensions. We have been using Sorbac to try and debride the surface. She has been to see orthopedics they apparently did a plain x-ray that was indeterminant. Diagnosed her with neuropathy and they have ordered an MRI to determine if there is underlying osteomyelitis. This was not high on my thought list but I suppose it is prudent. We have advised her to make an appointment with vein and vascular in [place]. She has a history of a left greater saphenous and accessory vein ablations I wonder if there is anything else that can be done from a surgical point of view to help in these difficult refractory wounds. We have previously healed this wound on one occasion but it keeps on reopening [medial side] 6/10; deep tissue culture I did last week I think on the left medial wound showed  both moderate E. coli and moderate staph aureus [MSSA]. She is going to require antibiotics and I have  chosen Augmentin. We have been using Sorbact and we have made better looking wound surface on both sides but certainly no improvement in wound area. She was back in last Friday apparently for a dressing changes the wrap was hurting her outer left ankle. She has not managed to get a hold of vein and vascular in [place]. We are going to have to make her that appointment 6/17; patient is tolerating the Augmentin. She had an MRI that I think was ordered by orthopedic surgeon this did not show osteomyelitis or an abscess did suggest cellulitis. We have been using Sorbact to the lateral and medial ankles. We have been trying to arrange a follow-up appointment with vein and vascular in [place] or did her original ablations. We apparently an area sent the request to vein and vascular in [facility] 6/24; patient has completed the Augmentin. We do not yet have a vein and vascular appointment in [place]. I am not sure what the issue is here we have asked her to call tomorrow. We are using Sorbact. Making some improvements and especially the medial wound. Both surfaces however look better medial and lateral. 7/1; the patient has been in contact with vein and vascular in [place] but has not yet received an appointment. Using Sorbact we have gradually improve the wound surface with no improvement in surface area. She is approved for Apligraf but the wound surface still is not completely viable. She has not had to come in for a dressing change 7/8; the patient has an appointment with vein and vascular on 7/31 which is a Friday afternoon. She is concerned about getting back here for [place] to dress her wounds. I think it is important to have them goal for her venous reflux/history of ablations etc. to see if anything else can be done. She apparently tested positive for 1 of the blood tests with regards to lupus and saw a rheumatologist. He has raised the issue of vasculitis again. I have had this thought in  the past however the evidence seems overwhelming that this is a venous reflux etiology. If the rheumatologist tells me there is clinical and laboratory investigation is positive for lupus I will rethink this. 7/15; the patient's wound surfaces are quite a bit better. The medial area which was her original wound now has no depth although the lateral wound which was the more recent area actually appears larger. Both with viable surfaces which is indeed better. Using Sorbact. I wanted to use Apligraf on her however there is the issue of the vein and vascular appointment on 7/31 at 2:00 in the afternoon which would not allow her to get back to be rewrapped and they would no doubt remove the graft 7/22; the patient's wound surfaces have moderate amount of debris although generally look better. The lateral one is larger with 2 small satellite areas superiorly. We are waiting for her vein and vascular appointment on 7/31. She has been approved for Apligraf which I would like to use after th 7/29; wound surfaces have improved no debridement is required we have been using Sorbact. She sees vein and vascular on Friday with this so question of whether anything can be done to lessen the likelihood of recurrence and/or speed the healing of these areas. She is already had previous ablations. She no doubt has severe venous hypertension 8/5-Patient returns at 1 week, she was in [employer]  for 3 days by her podiatrist, we have been using so backed to the wound, she has increased pain in both the wounds on the left lower leg especially the more distal one on the lateral aspect 8/12-Patient returns at 1 week and she is agreeable to having debridement in both wounds on her left leg today. We have been using Sorbact, and vascular studies were reviewed at last visit 8/19; the patient arrives with her wounds fairly clean and no debridement is required. We have used Sorbact which is really done a nice job in cleaning up  these very difficult wound surfaces. The patient saw Dr. Donzetta Matters of vascular surgery on 7/31. He did not feel that there was an arterial component. He felt that her treated greater saphenous vein is adequately addressed and that the small saphenous vein did not appear to be involved significantly. She was also noted to have deep venous reflux which is not treatable. Dr. Donzetta Matters mentioned the possibility of a central obstructive component leading to reflux and he offered her central venography. She wanted to discuss this or think about it. I have urged her to go ahead with this. She has had recurrent difficult wounds in these areas which do heal but after months in the clinic. If there is anything that can be done to reduce the likelihood of this I think it is worth it. 9/2 she is still working towards getting follow-up with Dr. Donzetta Matters to schedule her CT. Things are quite a bit worse venography. I put Apligraf on 2 weeks ago on both wounds on the medial and lateral part of her left lower leg. She arrives in clinic today with 3 superficial additional wounds above the area laterally and one below the wound medially. She describes a lot of discomfort. I think these are probably wrapped injuries. Does not look like she has cellulitis. 07/20/2019 on evaluation today patient appears to be doing somewhat poorly in regard to her lower extremity ulcers. She in fact showed signs of erythema in fact we may even be dealing with an infection at this time. Unfortunately I am unsure if this is just infection or if indeed there may be some allergic reaction that occurred as a result of the Apligraf application. With that being said that would be unusual but nonetheless not impossible in this patient is one who is unfortunately allergic to quite a bit. Currently we have been using the Sorbact which seems to do as well as anything for her. I do think we may want to obtain a culture today to see if there is anything showing up  there that may need to be addressed. 9/16; noted that last week the wounds look worse in 1 week follow-up of the Apligraf. Using Sorbact as of 2 days ago. She arrives with copious amounts of drainage and new skin breakdown on the back of the left calf. The wounds arm more substantial bilaterally. There is a fair amount of swelling in the left calf no overt DVT there is edema present I think in the left greater than right thigh. She is supposed to go on 9/28 for CT venography. The wounds on the medial and lateral calf are worse and she has new skin breakdown posteriorly at least new for me. This is almost developing into a circumferential wound area The Apligraf was taken off last week which I agree with things are not going in the right direction a culture was done we do not have that back yet. She is  on Augmentin that she started 2 days ago 9/23; dressing was changed by her nurses on Monday. In general there is no improvement in the wound areas although the area looks less angry than last week. She did get Augmentin for MSSA cultured on the 14th. She still appears to have too much swelling in the left leg even with 3 layer compression 9/30; the patient underwent her procedure on 9/28 by Dr. Donzetta Matters at [facility]. She was discovered to have the common iliac vein measuring 12.2 mm but at the level of L4-L5 measured 3 mm. After stenting it measured 10 mm. 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 Rouleaux flow in the common femoral and femoral vein was observed much improved after stenting. We are using silver alginate to the wounds on the medial and lateral ankle on the left. 4 layer compression 10/7; the patient had fluid swelling around her knee and 4 layer compression. At the advice of vein and vascular this was reduced to 3 layer which she is tolerating better. We have been using silver alginate under 3 layer compression since last Friday 10/14; arrives with the  areas on the left ankle looking a lot better. Inflammation in the area also a lot better. She came in for a nurse check on [name], [name] ([id number]) 10/9 10/21; continued nice improvement. Slight improvements in surface area of both the medial and lateral wounds on the left. A lot of the satellite lesions in the weeping erythema around these from stasis dermatitis is resolved. We have been using silver alginate 10/28; general improvement in the entire wound areas although not a lot of change in dimensions the wound certainly looks better. There is a lot less in terms of venous inflammation. Continue silver alginate this week however look towards Hydrofera Blue next week 11/4; very adherent debris on the medial wound left wound is not as bad. We have been using silver alginate. Change to [facility] today 11/11; very adherent debris on both wound areas. She went to vein and vascular last week and follow-up they put in [place] boot on this today. He says the [facility] was adherent. Wound is definitely not as good as last week. Especially on the left there the satellite lesions look more prominent 11/18; absolutely no better. erythema on lateral aspect with tenderness. 09/30/2019 on evaluation today patient appears to actually be doing better. Dr. Dellia Nims did put her on doxycycline last week which I do believe has helped her at this point. Fortunately there is no signs of active infection at this time. No fevers, chills, nausea, vomiting, or diarrhea. I do believe he may want extend the doxycycline for 7 additional days just to ensure everything does completely cleared up the patient is in agreement with that plan. Otherwise she is going require some sharp debridement today 12/2; patient is completing a 2-week course of doxycycline. I gave her this empirically for inflammation as well as infection when I last saw her 2 weeks ago. All of this seems to be better. She is using silver alginate she  has the area on the medial aspect of the larger area laterally and the 2 small satellite regions laterally above the major wound. 12/9; the patient's wound on the left medial and left lateral calf look really quite good. We have been using silver alginate. She saw vein and vascular in follow-up on 10/09/2019. She has had a previous left greater saphenous vein ablation by Dr. Oscar La in 2016.  More recently she underwent a left common iliac vein stent by Dr. 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 The swelling is improved and certainly the wounds have improved. The patient shows [place] today area on the right medial calf there is almost no wound but leaking lymphedema. She says she start this started 3 or 4 days ago. She did not traumatize it. It is not painful. She does not wear compression on that side 12/16; the patient continues to do well laterally. Medially still requiring debridement. The area on the right calf did not materialize to anything and is not currently open. We wrapped this last time. She has support stockings for that leg although I am not sure they are going to provide adequate compression 12/23; the lateral wound looks stable. Medially still requiring debridement for tightly adherent fibrinous debris. We've been using silver alginate. Surface area not any different 12/30; neither wound is any better with regards to surface and the area on the left lateral is larger. I been using silver alginate to the left lateral which look quite good last week and Sorbact to the left medial 11/11/2019. Lateral wound area actually looks better and somewhat smaller. Medial still requires a very aggressive debridement today. We have been using Sorbact on both wound areas 1/13; not much better still adherent debris bilaterally. I been using Sorbact. She has severe venous hypertension. Probably some degree of dermal fibrosis distally. I wonder whether tighter compression might help and I am going  to try that today. We also need to work on the bioburden 1/20; using Sorbact. She has severe venous hypertension status post stent placement for pelvic vein compression. We applied gentamicin last time to see if we could reduce bioburden I had some discussion with her today about the use of pentoxifylline. This is occasionally used in this setting for wounds with refractory venous insufficiency. However this interacts with Plavix. She tells me that she was put on this after stent placement for 3 months. She will call Dr. Claretha Cooper office to discuss 1/27; we are using gentamicin under Sorbact. 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 She has a stent. Wound medially is measuring smaller this week. Laterally measuring slightly larger although she has some satellite lesions superiorly 2/3; gentamicin under Sorbact under 4-layer compression. 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 She has a stent on Plavix. Her wounds are measuring smaller this week. More substantially laterally where there is a satellite lesion superiorly. 2/10; gentamicin under Sorbac. 4-layer compression. Patient communicated with Dr. Donzetta Matters at vein and vascular in [place]. He is okay with the patient coming off Plavix I will therefore start her on pentoxifylline for a 1 month trial. In general her wounds look better today. I had some concerns about swelling in the left thigh however she measures 61.5 on the right and 63 on the mid thigh which does not suggest there is any difficulty. The patient is not describing any pain. 2/17; gentamicin under Sorbac 4-layer compression. She has been on pentoxifylline for 1 week and complains of loose stool. No nausea she is eating and drinking well 2/24; the patient apparently came in 2 days ago for a nurse visit when her wrap fell down. Both areas look a little worse this week macerated medially and satellite lesions laterally. Change to  silver alginate today 3/3; wounds are larger today especially medially. She also has more swelling in her foot lower leg and I even noted some swelling in  her posterior thigh which is tender. I wonder about the patency of her stent. Fortuitously she sees Dr. Claretha Cooper group on Friday 3/10; Mrs. Mastrogiovanni was seen by vein and vascular on 3/5. The patient underwent ultrasound. There was no evidence of thrombosis involving the IVC no evidence of thrombosis involving the right common iliac vein there is no evidence of thrombosis involving the right external iliac vein the left external vein is also patent. The right common iliac vein stent appears patent bilateral common femoral veins are compressible and appear patent. I was concerned about the left common iliac stent however it looks like this is functional. She has some edema in the posterior thigh that was tender she still has that this week. I also note they had trouble finding the pulses in her left foot and booked her for an ABI baseline in 4 weeks. She will follow up in 6 months for repeat IVC duplex. The patient stopped the pentoxifylline because of diarrhea. It does not look like that was being effective in any case. I have advised her to go back on her aspirin 81 mg tablet, vascular it also suggested this 3/17; comes in today with her wound surfaces a lot better. The excoriations from last week considerably better probably secondary to the TCA. We have been using silver alginate 3/24; comes in today with smaller wounds both medially and laterally. Both required debridement. There are 2 small satellite areas superiorly laterally. She also has a very odd bandlike area in the mid calf almost looking like there was a weakness in the wrap in a localized area. I would write this off as being this however anteriorly she has a small raised ballotable area that is very tender almost reminiscent of an abscess but there was no obvious purulent surface to  it. 02/04/20 upon evaluation today patient appears to be doing fairly well in regard to her wounds today. Fortunately there is no signs of active [name], [name]. ([id number]) infection at this time. No fevers, chills, nausea, vomiting, or diarrhea. She has been tolerating the dressing changes without complication. Fortunately I feel like she is showing signs of improvement although has been sometime since have seen her. Nonetheless the area of concern that Dr. Dellia Nims had last week where she had possibly an area of the wrap that was we can allow the leg to bulge appears to be doing significantly better today there is no signs of anything worsening. 4/7; the patient's wounds on her medial and lateral left leg continue to contract. We have been using a regular alginate. Last week she developed an area on the right medial lower leg which is probably a venous ulcer as well. 4/14; the wounds on her left medial and lateral lower leg continue to contract. Surface eschar. We have been using regular alginate. The area on the right medial lower leg is closed. We have been putting both legs under 4-layer contraction. The patient went back to see vein and vascular she had arterial studies done which were apparently "quite good" per the patient although I have not read their notes I have never felt she had an arterial issue. 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 This is been longstanding and refractory to exercise, leg elevation and longstanding use of compression wraps in our clinic as well as compression stockings on the times we have been able to get these to heal 4/21; we thought she actually might be close this week however she arrives in  clinic with a lot of edema in her upper left calf and into her posterior thigh. This is been an intermittent problem here. She says the wrap fell down but it was replaced with a nurse visit on Monday. We are using calcium  alginate to the wounds and the wound sizes there not terribly larger than last week but there is a lot more edema 4/28; again wound edges are smaller on both sides. Her edema is better controlled than last time. She is obtained her compression pumps from [employer] although they have not been to her home to set these up. 5/5; left medial and left lateral both look stable. I am not sure the medial is any smaller. We have been using calcium alginate under 4-layer compression. oShe had an area on the right medial. This was eschared today. We have been wrapping this as well. She does not tolerate external compression stockings due to a history of various contact allergies. She has her compression pumps however the representative from the company is coming on her to show her how to use these tomorrow 5/19; patient with severe chronic venous insufficiency secondary to central venous disease. She had a stent placed in her left common iliac vein. She has done better since but still difficult to control wounds. She comes in today with nothing open on the right leg. Her areas on the left medial and left lateral are just about closed. We are using calcium alginate under 4-layer compression. She is using her external compression pumps at home She only has 15-20 support stockings. States she cannot get anything tighter than that on. 03/30/20-Patient returns at 1 week, the wounds on the left leg are both slightly bigger, the last week she was on 3 layer compression which started to slide down. She is starting to use her lymphedema pumps although she stated on 1 day her right ankle started to swell up and she have to stop that day. Unfortunately the open area seem to oscillate between improving to the point of healing and then flaring up all to do with effectiveness of compression or lack of due to the left leg topography not keeping the compression wraps from rolling down 6/2 patient comes in with a 15/20  mmHg stocking on the right leg. She tells me that she developed a lot of swelling in her ankles she saw orthopedics she was felt to possibly be having a flare of pseudogout versus some other type of arthritis. She was put on steroids for a respiratory issue so that helps with the inflammation. She has not been using the pumps all week. She thinks the left thigh is more swollen than usual and I would agree with that. She has an appointment with Dr. Donzetta Matters 9 days or so from now 6/9; both wounds on the left medial and left lateral are smaller. We have been using calcium alginate under compression. She does not have an open wound on the right leg she is using a stocking and her compression pumps things are going well. She has an appointment with Dr. Donzetta Matters with regards to her stent in the left common iliac vein 6/16; the wounds on the left medial and left lateral ankle continues to contract. The patient saw Dr. Donzetta Matters and I think he seems satisfied. Ordered follow-up venous reflux studies on both sides in September. Cautioned that she may need thigh-high stockings. She has been using calcium alginate under compression on the left and her own stocking on the right leg.  She tells [place] there are no open wounds on the right 6/23; left lateral is just about closed. Medial required debridement today. We have been using calcium alginate. Extensive discussion about the compression pumps she is only using these on 25 mmHg states she could not take 40 or 30 when the wrap came out to her home to demonstrate these. He said they should not feel tight 6/30; the left lateral wound has a slight amount of eschar. . The area medially is about the same using Hydrofera Blue. 7/7; left lateral wound still has some eschar. I will remove this next week may be closed. The area medially is very small using Hydrofera Blue with improvement. Unfortunately the stockings fell down. Unfortunately the blisters have developed at the edge of  where the wrap fell. When this happened she says her legs hurt she did not use her pumps. We are not open Monday for her to come in and change the wraps and she had an appointment yesterday. She also tells me that she is going to have an MRI of her back. She is having pain radiating into her left anterior leg she thinks her from an L5 disc. She saw Dr. Ellene Route of neurosurgery 7/14; the area on the left lateral ankle area is closed. Still a small area medially however it looks better as well. We have been using Hydrofera Blue under 4-layer compression 7/21; left lateral ankle is still closed however her wound on the medial left calf is actually larger. This is probably because Hydrofera Blue got stuck to the wound. She came in for a nurse change on Friday and will do that again this week I was concerned about the amount of swelling that she had last week however she is using her compression pumps twice a day and the swelling seems well controlled 7/28; remaining wound on the left medial lower leg is smaller. We have been using moistened silver collagen under compression she is coming back for a nurse visit. For reasons that were not really clear she was just keeping her legs elevated and not using her compression pumps. I have asked her to use the compression pumps. She does not have any wounds on the right leg 06/15/20-Patient returns at 2 weeks, her LLE edema is worse and she developed a blister wound that is new and has bigger posterior calf wound on right, we are using Prisma with pad, 4 layer compression. she has been on lasix 40 mg daily 8/18; patient arrives today with things a lot worse than I remember from a few weeks ago. She was seen last week. Noted that her edema was worse and that she now had a left lateral wound as well as deteriorating edema in the medial and posterior part of the lower leg. She says she is using his or her external compression pumps once a day although I wonder about  the compliance. Electronic Signature(s) Signed: 06/22/2020 4:48:02 PM By: Linton Ham MD Entered By: Linton Ham on 06/22/2020 15:05:51 [name], [name] ([id number]) -------------------------------------------------------------------------------- Physical Exam Details Patient Name: TAMIKO, LEOPARD. Date of Service: 06/22/2020 2:15 PM Medical Record Number: [id number] Patient Account Number: [id number] Date of Birth/Sex: 1946/01/20 (74 y.o. F) Treating RN: Cornell Barman Primary Care Provider: [name] Other Clinician: Referring Provider: [name] Treating Provider/Extender: Tito Dine in Treatment: 39 Constitutional Patient is hypertensive.. Pulse regular and within target range for patient.Marland Kitchen Respirations regular, non-labored and within target range.. Temperature is normal and within the target range  for the patient.Marland Kitchen appears in no distress. Cardiovascular Pedal pulses are palpable. She does not have good edema control from the left mid calf up. Nonpitting lymphedema. She has dermatofibrosis distally. Notes Wound exam; leg edema is not well controlled especially from the mid calf up. I think this is contributing to further skin breakdown. I do not believe she has excessive edema and her left thigh. Electronic Signature(s) Signed: 06/22/2020 4:48:02 PM By: Linton Ham MD Entered By: Linton Ham on 06/22/2020 15:14:23 [name], [name] ([id number]) -------------------------------------------------------------------------------- Physician Orders Details Patient Name: [name], [name]. Date of Service: 06/22/2020 2:15 PM Medical Record Number: [id number] Patient Account Number: [id number] Date of Birth/Sex: Sep 28, 1946 (74 y.o. F) Treating RN: Cornell Barman Primary Care Provider: [name] Other Clinician: Referring Provider: [name] Treating Provider/Extender: Tito Dine in Treatment: 16 Verbal / Phone Orders: No Diagnosis  Coding Anesthetic (add to Medication List) Wound #12 Left,Lateral Lower Leg o Topical Lidocaine 4% cream applied to wound bed prior to debridement (In Clinic Only). Wound #5 Left,Medial Lower Leg o Topical Lidocaine 4% cream applied to wound bed prior to debridement (In Clinic Only). Primary Wound Dressing Wound #12 Left,Lateral Lower Leg o Silver Alginate Wound #5 Left,Medial Lower Leg o Silver Alginate Secondary Dressing Wound #12 Left,Lateral Lower Leg o ABD pad Wound #5 Left,Medial Lower Leg o ABD pad Dressing Change Frequency Wound #12 Left,Lateral Lower Leg o Change dressing every week o Other: - Nurse visit Friday and Monday if needed. Wound #5 Left,Medial Lower Leg o Change dressing every week o Other: - Nurse visit Friday and Monday if needed. Follow-up Appointments Wound #12 Left,Lateral Lower Leg o Return Appointment in 1 week. o Nurse Visit as needed Wound #5 Left,Medial Lower Leg o Return Appointment in 1 week. o Nurse Visit as needed Edema Control Wound #12 Left,Lateral Lower Leg o 4-Layer Compression System - Left Lower Extremity. o Patient to wear own compression stockings - On Right o Compression Pump: Use compression pump on left lower extremity for 60 minutes, twice daily. o Compression Pump: Use compression pump on right lower extremity for 60 minutes, twice daily. Wound #5 Left,Medial Lower Leg o 4-Layer Compression System - Left Lower Extremity. o Patient to wear own compression stockings - On Right o Compression Pump: Use compression pump on left lower extremity for 60 minutes, twice daily. o Compression Pump: Use compression pump on right lower extremity for 60 minutes, twice daily. Electronic Signature(s) Signed: 06/22/2020 4:48:02 PM By: Linton Ham MD [name] ([id number]) Signed: 06/23/2020 6:39:29 PM By: [name], [name], RN, CWS, [name], [name] Entered By: [name], [name], RN, CWS, Kim on 06/22/2020  14:56:46 [name], Polkowski [name] ([id number]) -------------------------------------------------------------------------------- Problem List Details Patient Name: CYNDRA, FEINBERG. Date of Service: 06/22/2020 2:15 PM Medical Record Number: [id number] Patient Account Number: [id number] Date of Birth/Sex: 30-May-1946 (74 y.o. F) Treating RN: Cornell Barman Primary Care Provider: [name] Other Clinician: Referring Provider: [name] Treating Provider/Extender: Tito Dine in Treatment: 28 Active Problems ICD-10 Encounter Code Description Active Date MDM Diagnosis L97.221 Non-pressure chronic ulcer of left calf limited to breakdown of skin 01/07/2019 No Yes I89.0 Lymphedema, not elsewhere classified 12/10/2018 No Yes I87.332 Chronic venous hypertension (idiopathic) with ulcer and inflammation of 12/09/2019 No Yes left lower extremity I83.009 Varicose veins of unspecified lower extremity with ulcer of unspecified 04/13/2020 No Yes site I82.592 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 12/09/2019 No Yes lower extremity Inactive Problems ICD-10 Code Description Active Date  Inactive Date L97.818 Non-pressure chronic ulcer of other part of right lower leg with other specified 10/14/2019 10/14/2019 severity Resolved Problems ICD-10 Code Description Active Date Resolved Date L97.211 Non-pressure chronic ulcer of right calf limited to breakdown of skin 02/10/2020 02/10/2020 Electronic Signature(s) Signed: 06/22/2020 4:48:02 PM By: Linton Ham MD Entered By: Linton Ham on 06/22/2020 15:04:17 Poth, [name] ([id number]) -------------------------------------------------------------------------------- Progress Note Details Patient Name: KEYAH, BLIZARD. Date of Service: 06/22/2020 2:15 PM Medical Record Number: [id number] Patient Account Number: [id number] Date of Birth/Sex: 1946-09-03 (74 y.o. F) Treating RN: Cornell Barman Primary Care Provider: [name] Other Clinician: Referring Provider: [name] Treating Provider/Extender: Tito Dine in Treatment: 80 Subjective History of Present Illness (HPI) Pleasant 74 year old with history of chronic venous insufficiency. No diabetes or peripheral vascular disease. Left ABI 1.29. Questionable history of left lower extremity DVT. She developed a recurrent ulceration on her left lateral calf in December 2015, which she attributes to poor diet and subsequent lower extremity edema. She underwent endovenous laser ablation of her left greater saphenous vein in 2010. She underwent laser ablation of accessory branch of left GSV in April 2016 by Dr. Kellie Simmering at [facility]. She was previously wearing Unna boots, which she tolerated well. Tolerating 2 layer compression and cadexomer iodine. She returns to clinic for follow-up and is without new complaints. She denies any significant pain at this time. She reports persistent pain with pressure. No claudication or ischemic rest pain. No fever or chills. No drainage. READMISSION 11/13/16; this is a 74 year old woman who is not a diabetic. She is here for a review of a painful area on her left medial lower extremity. I note that she was seen here previously last year for wound I believe to be in the same area. At that time she had undergone previously a left greater saphenous vein ablation by Dr. Kellie Simmering and she had a ablation of the anterior accessory branch of the left greater saphenous vein in March 2016. Seeing that the wound actually closed over. In reviewing the history with her today the ulcer in this area has been recurrent. She describes a biopsy of this area in 2009 that only showed stasis physiology. 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 Do not see that she has ever had arterial studies in the left leg. She tells me she has a follow-up with Dr. Kellie Simmering in roughly 10 days In any case she developed the reopening of this area roughly a month ago. On the background of this she describes rapidly increasing edema which has responded to Lasix 40 mg and metolazone 2.5 mg as well as the patient's lymph massage. She has been told she has both venous insufficiency and lymphedema but she cannot tolerate compression stockings 11/28/16; the patient saw Dr. Kellie Simmering recently. Per the patient he did arterial Dopplers in the office that did not show evidence of arterial insufficiency, per the patient he stated "treat this like an ordinary venous ulcer". She also saw her dermatologist Dr. Ronnald Ramp who felt that this was more of a vascular ulcer. In general things are improving although she arrives today with increasing bilateral lower extremity edema with weeping a deeper fluid through the wound on the left medial leg compatible with some degree of lymphedema  12/04/16; the patient's wound is fully epithelialized but I don't think fully healed. We will do another week of depression with Promogran and TCA however I suspect we'll be able to discharge her next week. This is a very unusual-looking wound which was initially a figure-of-eight type wound lying on its side surrounded by petechial like hemorrhage. She has had venous ablation on this side. She apparently does not have an arterial issue per Dr. Kellie Simmering. She saw her dermatologist thought it was "vascular". Patient is definitely going to need ongoing compression and I talked about this with her today she will go to elastic therapy after she leaves here next week 12/11/16; the patient's wound is not completely closed today. She has surrounding scar tissue and in further discussion with the patient it would appear that she had ulcers in this area in 2009 for a prolonged period of time  ultimately requiring a punch biopsy of this area that only showed venous insufficiency. I did not previously pickup on this part of the history from the patient. 12/18/16; the patient's wound is completely epithelialized. There is no open area here. She has significant bilateral venous insufficiency with secondary lymphedema to a mild-to-moderate degree she does not have compression stockings.. She did not say anything to me when I was in the room, she told our intake nurse that she was still having pain in this area. This isn't unusual recurrent small open area. She is going to go to elastic therapy to obtain compression stockings. 12/25/16; the patient's wound is fully epithelialized. There is no open area here. The patient describes some continued episodic discomfort in this area medial left calf. However everything looks fine and healed here. She is been to elastic therapy and caught herself 15-20 mmHg stockings, they apparently were having trouble getting 20-30 mm stockings in her size 01/22/17; this is a patient we discharged from the clinic a month ago. She has a recurrent open wound on her medial left calf. She had 15 mm support stockings. I told her I thought she needed 20-30 mm compression stockings. She tells me that she has been ill with hospitalization secondary to asthma and is been found to have severe hypokalemia likely secondary to a combination of Lasix and metolazone. This morning she noted blistering and leaking fluid on the posterior part of her left leg. She called our intake nurse urgently and we was saw her this afternoon. She has not had any real discomfort here. I don't know that she's been wearing any stockings on this leg for at least 2-3 days. ABIs in this clinic were 1.21 on the right and 1.3 on the left. She is previously seen vascular surgery who does not think that there is a peripheral arterial issue. 01/30/17; Patient arrives with no open wound on the left leg. She has  been to elastic therapy and obtained 20-5mmhg below knee stockings and she has one on the right leg today. READMISSION 02/19/18; this Hansell is a now 74 year old patient we've had in this clinic perhaps 3 times before. I had last looked at her from January 07 December 2016 with an area on the medial left leg. We discharged her on 12/25/16 however she had to be readmitted on 01/22/17 with a recurrence. I have in my notes that we discharged her on 20-30 mm stockings although she tells me she was only wearing support hose because she cannot get stockings on predominantly related to her cervical spine surgery/issues. She has had previous ablations done by vein  and vascular in [place] including a great saphenous vein ablation on the left with an anterior accessory branch ablation I think both of these were in 2016. On one of the previous visit she had a biopsy noted 2009 that was negative. She is not felt to have an arterial issue. She is not a diabetic. She does have a history of obstructive sleep apnea hypertension asthma as well as chronic venous insufficiency and lymphedema. On this occasion she noted 2 dry scaly patch on her left leg. She tried to put lotion on this it didn't really help. There were 2 open areas.the patient has been seeing her primary physician from 02/05/18 through 02/14/18. She had Unna boots applied. The superior wound now on the lateral left leg has closed but she's had one wound that remains open on the lateral left leg. This is not the same spot as we dealt with in 2018. ABIs in this clinic were 1.3 bilaterally VANESSIA, BOKHARI ([id number]) 02/26/18; patient has a small wound on the left lateral calf. Dimensions are down. She has chronic venous insufficiency and lymphedema. 03/05/18; small open area on the left lateral calf. Dimensions are down. Tightly adherent necrotic debris over the surface of the wound which was difficult to remove. Also the dressing [over collagen] stuck to the  wound surface. This was removed with some difficulty as well. Change the primary dressing to Hydrofera Blue ready 03/12/18; small open area on the left lateral calf. Comes in with tightly adherent surface eschar as well as some adherent Hydrofera Blue. 03/19/18; open area on the left lateral calf. Again adherent surface eschar as well as some adherent Hydrofera Blue nonviable subcutaneous tissue. She complained of pain all week even with the reduction from 4-3 layer compression I put on last week. Also she had an increase in her ankle and calf measurements probably related to the same thing. 03/26/18; open area on the left lateral calf. A very small open area remains here. We used silver alginate starting last week as the Hydrofera Blue seem to stick to the wound bed. In using 4-layer compression 04/02/18; the open area in the left lateral calf at some adherent slough which I removed there is no open area here. We are able to transition her into her own compression stocking. Truthfully I think this is probably his support hose. However this does not maintain skin integrity will be limited. She cannot put over the toe compression stockings on because of neck problems hand problems etc. She is allergic to the lining layer of juxta lites. We might be forced to use extremitease stocking should this fail READMIT 11/24/2018 Patient is now a 74 year old woman who is not a diabetic. She has been in this clinic on at least 3 previous occasions largely with recurrent wounds on her left leg secondary to chronic venous insufficiency with secondary lymphedema. Her situation is complicated by inability to get stockings on and an allergy to neoprene which is apparently a component and at least juxta lites and other stockings. As a result she really has not been wearing any stockings on her legs. She tells [place] that roughly 2 or 3 weeks ago she started noticing a stinging sensation just above her ankle on the left medial  aspect. She has been diagnosed with pseudogout and she wondered whether this was what she was experiencing. She tried to dress this with something she bought at the store however subsequently it pulled skin off and now she has an open wound that  is not improving. She has been using Vaseline gauze with a cover bandage. She saw her primary doctor last week who put an Haematologist on her. ABIs in this clinic was 1.03 on the left 2/12; the area is on the left medial ankle. Odd-looking wound with what looks to be surface epithelialization but a multitude of small petechial openings. This clearly not closed yet. We have been using silver alginate under 3 layer compression with TCA 2/19; the wound area did not look quite as good this week. Necrotic debris over the majority of the wound surface which required debridement. She continues to have a multitude of what looked to be small petechial openings. She reminds [place] that she had a biopsy on this initially during her first outbreak in 2015 in [facility]. She expresses concern about this being a possible melanoma. 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 I assured her that this does not look anything like melanoma. Except for the petechial reaction it does look like a venous insufficiency area and she certainly has evidence of this on both sides 2/26; a difficult area on the left medial ankle. The patient clearly has chronic venous hypertension with some degree of lymphedema. The odd thing about the area is the small petechial hemorrhages. I am not really sure how to explain this. This was present last time and this is not a compression injury. We have been using Hydrofera Blue which I changed to last week 3/4; still using Hydrofera Blue. Aggressive debridement today. She does not have known arterial issues. She has seen Dr. Kellie Simmering at [facility] vein and  vascular and and has an ablation on the left. [Anterior accessory branch of the greater saphenous]. From what I remember they did not feel she had an arterial issue. The patient has had this area biopsied in 2009 at [facility] dermatology and by her recollection they said this was "stasis". She is also follow-up with dermatology locally who thought that this was more of a vascular issue 3/11; using Hydrofera Blue. Aggressive debridement today. She does not have an arterial issue. We are using 3 layer compression although we may need to go to 4. The patient has been in for multiple changes to her wrap since I last saw her a week ago. She says that the area was leaking. I do not have too much more information on what was found 01/19/19 on evaluation today patient was actually being seen for a nurse visit when unfortunately she had the area on her left lateral lower extremity as well as weeping from the right lower extremity that became apparent. Therefore we did end up actually seeing her for a full visit with myself. She is having some pain at this site as well but fortunately nothing too significant at this point. No fevers, chills, nausea, or vomiting noted at this time. 3/18-Patient is back to the clinic with the left leg venous leg ulcer, the ulcer is larger in size, has a surface that is densely adherent with fibrinous tissue, the Hydrofera Blue was used but is densely adherent and there was difficulty in removing it. The right lower extremity was also wrapped for weeping edema. Patient has a new area over the left lateral foot above the malleolus that is small and appears to have no debris with intact surrounding skin. Patient is on increased dose of Lasix also as a means to edema management 3/25; the patient  has a nonhealing venous ulcer on the medial left leg and last week developed a smaller area on the lateral left calf. We have been using Hydrofera Blue with a contact layer. 4/1; no major  change in these wounds areas. Left medial and more recently left lateral calf. I tried Iodoflex last week to aid in debridement she did not tolerate this. She stated her pain was terrible all week. She took the top layer of the 4 layer compression off. 4/8; the patient actually looks somewhat better in terms of her more prominent left lateral calf wound. There is some healthy looking tissue here. She is still complaining of a lot of discomfort. 4/15; patient in a lot of pain secondary to sciatica. She is on a prednisone taper prescribed by her primary physician. She has the 2 areas one on the left medial and more recently a smaller area on the left lateral calf. Both of these just above the malleoli 4/22; her back pain is better but she still states she is very uncomfortable and now feels she is intolerant to the [employer]. No real change in the wounds we have been using Sorbact. She has been previously intolerant to Iodoflex. There is not a lot of option about what we can use to debride this wound under compression that she no doubt needs. sHe states Ultram no longer works for her pain 4/29; no major change in the wounds slightly increased depth. Surface on the original medial wound perhaps somewhat improved however the more recent area on the lateral left ankle is 100% covered in very adherent debris we have been using Sorbact. She tolerates 4 layer compression well and her edema control is a lot better. She has not had to come in for a nurse check 5/6; no major change in the condition of the wounds. She did consent to debridement today which was done with some difficulty. Continuing Sorbact. She did not tolerate Iodoflex. She was in for a check of her compression the day after we wrapped her last week this was adjusted but nothing much was found 5/13; no major change in the condition or area of the wounds. I was able to get a fairly aggressive debridement done on the lateral left leg wound. Even  using Sorbact under compression. She came back in on Friday to have the wrap changed. She says she felt uncomfortable on the lateral aspect of her ankle. She has a long history of chronic venous insufficiency including previous ablation surgery on this side. 5/20-Patient returns for wounds on left leg with both wounds covered in slough, with the lateral leg wound larger in size, she has been in 3 layer compression and felt more comfortable, she describes pain in ankle, in leg and pins and needles in foot, and is about to try Pamelor for this 6/3; wounds on the left lateral and left medial leg. The area medially which is the most recent of the 2 seems to have had the largest increase in [place], MAKAYIA DUPLESSIS. ([id number]) dimensions. We have been using Sorbac to try and debride the surface. She has been to see orthopedics they apparently did a plain x-ray that was indeterminant. Diagnosed her with neuropathy and they have ordered an MRI to determine if there is underlying osteomyelitis. This was not high on my thought list but I suppose it is prudent. We have advised her to make an appointment with vein and vascular in [place]. She has a history of a left greater saphenous and accessory  vein ablations I wonder if there is anything else that can be done from a surgical point of view to help in these difficult refractory wounds. We have previously healed this wound on one occasion but it keeps on reopening [medial side] 6/10; deep tissue culture I did last week I think on the left medial wound showed both moderate E. coli and moderate staph aureus [MSSA]. She is going to require antibiotics and I have chosen Augmentin. We have been using Sorbact and we have made better looking wound surface on both sides but certainly no improvement in wound area. She was back in last Friday apparently for a dressing changes the wrap was hurting her outer left ankle. She has not managed to get a hold of vein and vascular in  [place]. We are going to have to make her that appointment 6/17; patient is tolerating the Augmentin. She had an MRI that I think was ordered by orthopedic surgeon this did not show osteomyelitis or an abscess did suggest cellulitis. We have been using Sorbact to the lateral and medial ankles. We have been trying to arrange a follow-up appointment with vein and vascular in [place] or did her original ablations. We apparently an area sent the request to vein and vascular in [facility] 6/24; patient has completed the Augmentin. We do not yet have a vein and vascular appointment in [place]. I am not sure what the issue is here we have asked her to call tomorrow. We are using Sorbact. Making some improvements and especially the medial wound. Both surfaces however look better medial and lateral. 7/1; the patient has been in contact with vein and vascular in [place] but has not yet received an appointment. Using Sorbact we have gradually improve the wound surface with no improvement in surface area. She is approved for Apligraf but the wound surface still is not completely viable. She has not had to come in for a dressing change 7/8; the patient has an appointment with vein and vascular on 7/31 which is a Friday afternoon. She is concerned about getting back here for [place] to dress her wounds. I think it is important to have them goal for her venous reflux/history of ablations etc. to see if anything else can be done. She apparently tested positive for 1 of the blood tests with regards to lupus and saw a rheumatologist. He has raised the issue of vasculitis again. I have had this thought in the past however the evidence seems overwhelming that this is a venous reflux etiology. If the rheumatologist tells me there is clinical and laboratory investigation is positive for lupus I will rethink this. 7/15; the patient's wound surfaces are quite a bit better. The medial area which was her original wound  now has no depth although the lateral wound which was the more recent area actually appears larger. Both with viable surfaces which is indeed better. Using Sorbact. I wanted to use Apligraf on her however there is the issue of the vein and vascular appointment on 7/31 at 2:00 in the afternoon which would not allow her to get back to be rewrapped and they would no doubt remove the graft 7/22; the patient's wound surfaces have moderate amount of debris although generally look better. The lateral one is larger with 2 small satellite areas superiorly. We are waiting for her vein and vascular appointment on 7/31. She has been approved for Apligraf which I would like to use after th 7/29; wound surfaces have improved no debridement is required  we have been using Sorbact. She sees vein and vascular on Friday with this so question of whether anything can be done to lessen the likelihood of recurrence and/or speed the healing of these areas. She is already had previous ablations. She no doubt has severe venous hypertension 8/5-Patient returns at 1 week, she was in [place] for 3 days by her podiatrist, we have been using so backed to the wound, she has increased pain in both the wounds on the left lower leg especially the more distal one on the lateral aspect 8/12-Patient returns at 1 week and she is agreeable to having debridement in both wounds on her left leg today. We have been using Sorbact, and vascular studies were reviewed at last visit 8/19; the patient arrives with her wounds fairly clean and no debridement is required. We have used Sorbact which is really done a nice job in cleaning up these very difficult wound surfaces. The patient saw Dr. Donzetta Matters of vascular surgery on 7/31. He did not feel that there was an arterial component. He felt that her treated greater saphenous vein is adequately addressed and that the small saphenous vein did not appear to be involved significantly. She was also noted  to have deep venous reflux which is not treatable. Dr. Donzetta Matters mentioned the possibility of a central obstructive component leading to reflux and he offered her central venography. She wanted to discuss this or think about it. I have urged her to go ahead with this. She has had recurrent difficult wounds in these areas which do heal but after months in the clinic. If there is anything that can be done to reduce the likelihood of this I think it is worth it. 9/2 she is still working towards getting follow-up with Dr. Donzetta Matters to schedule her CT. Things are quite a bit worse venography. I put Apligraf on 2 weeks ago on both wounds on the medial and lateral part of her left lower leg. She arrives in clinic today with 3 superficial additional wounds above the area laterally and one below the wound medially. She describes a lot of discomfort. I think these are probably wrapped injuries. Does not look like she has cellulitis. 07/20/2019 on evaluation today patient appears to be doing somewhat poorly in regard to her lower extremity ulcers. She in fact showed signs of erythema in fact we may even be dealing with an infection at this time. Unfortunately I am unsure if this is just infection or if indeed there may be some allergic reaction that occurred as a result of the Apligraf application. With that being said that would be unusual but nonetheless not impossible in this patient is one who is unfortunately allergic to quite a bit. Currently we have been using the Sorbact which seems to do as well as anything for her. I do think we may want to obtain a culture today to see if there is anything showing up there that may need to be addressed. 9/16; noted that last week the wounds look worse in 1 week follow-up of the Apligraf. Using Sorbact as of 2 days ago. She arrives with copious amounts of drainage and new skin breakdown on the back of the left calf. The wounds arm more substantial bilaterally. There is a fair  amount of swelling in the left calf no overt DVT there is edema present I think in the left greater than right thigh. She is supposed to go on 9/28 for CT venography. The wounds on the medial  and lateral calf are worse and she has new skin breakdown posteriorly at least new for me. This is almost developing into a circumferential wound area The Apligraf was taken off last week which I agree with things are not going in the right direction a culture was done we do not have that back yet. She is on Augmentin that she started 2 days ago 9/23; dressing was changed by her nurses on Monday. In general there is no improvement in the wound areas although the area looks less angry than last week. She did get Augmentin for MSSA cultured on the 14th. She still appears to have too much swelling in the left leg even with 3 layer compression 9/30; the patient underwent her procedure on 9/28 by Dr. Donzetta Matters at [facility]. She was discovered to have the common iliac vein measuring 12.2 mm but at the level of L4-L5 measured 3 mm. After stenting it measured 10 mm. 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 Rouleaux flow in the common femoral and femoral vein was observed much improved after stenting. We are using silver alginate to the wounds on the medial and lateral ankle on the left. 4 layer compression 10/7; the patient had fluid swelling around her knee and 4 layer compression. At the advice of vein and vascular this was reduced to 3 layer which she is tolerating better. We have been using silver alginate under 3 layer compression since last Friday 10/14; arrives with the areas on the left ankle looking a lot better. Inflammation in the area also a lot better. She came in for a nurse check on 10/9 10/21; continued nice improvement. Slight improvements in surface area of both the medial and lateral wounds on the left. A lot of the satellite lesions in the weeping erythema around  these from stasis dermatitis is resolved. We have been using silver alginate ANALIE, KATZMAN ([id number]) 10/28; general improvement in the entire wound areas although not a lot of change in dimensions the wound certainly looks better. There is a lot less in terms of venous inflammation. Continue silver alginate this week however look towards Hydrofera Blue next week 11/4; very adherent debris on the medial wound left wound is not as bad. We have been using silver alginate. Change to [facility] today 11/11; very adherent debris on both wound areas. She went to vein and vascular last week and follow-up they put in [place] boot on this today. He says the [facility] was adherent. Wound is definitely not as good as last week. Especially on the left there the satellite lesions look more prominent 11/18; absolutely no better. erythema on lateral aspect with tenderness. 09/30/2019 on evaluation today patient appears to actually be doing better. Dr. Dellia Nims did put her on doxycycline last week which I do believe has helped her at this point. Fortunately there is no signs of active infection at this time. No fevers, chills, nausea, vomiting, or diarrhea. I do believe he may want extend the doxycycline for 7 additional days just to ensure everything does completely cleared up the patient is in agreement with that plan. Otherwise she is going require some sharp debridement today 12/2; patient is completing a 2-week course of doxycycline. I gave her this empirically for inflammation as well as infection when I last saw her 2 weeks ago. All of this seems to be better. She is using silver alginate she has the area on the medial aspect of the larger area laterally  and the 2 small satellite regions laterally above the major wound. 12/9; the patient's wound on the left medial and left lateral calf look really quite good. We have been using silver alginate. She saw vein and vascular in follow-up on 10/09/2019. She  has had a previous left greater saphenous vein ablation by Dr. Oscar La in 2016. More recently she underwent a left common iliac vein stent by Dr. 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 The swelling is improved and certainly the wounds have improved. The patient shows [place] today area on the right medial calf there is almost no wound but leaking lymphedema. She says she start this started 3 or 4 days ago. She did not traumatize it. It is not painful. She does not wear compression on that side 12/16; the patient continues to do well laterally. Medially still requiring debridement. The area on the right calf did not materialize to anything and is not currently open. We wrapped this last time. She has support stockings for that leg although I am not sure they are going to provide adequate compression 12/23; the lateral wound looks stable. Medially still requiring debridement for tightly adherent fibrinous debris. We've been using silver alginate. Surface area not any different 12/30; neither wound is any better with regards to surface and the area on the left lateral is larger. I been using silver alginate to the left lateral which look quite good last week and Sorbact to the left medial 11/11/2019. Lateral wound area actually looks better and somewhat smaller. Medial still requires a very aggressive debridement today. We have been using Sorbact on both wound areas 1/13; not much better still adherent debris bilaterally. I been using Sorbact. She has severe venous hypertension. Probably some degree of dermal fibrosis distally. I wonder whether tighter compression might help and I am going to try that today. We also need to work on the bioburden 1/20; using Sorbact. She has severe venous hypertension status post stent placement for pelvic vein compression. We applied gentamicin last time to see if we could reduce bioburden I had some discussion with her today about the use of pentoxifylline. This  is occasionally used in this setting for wounds with refractory venous insufficiency. However this interacts with Plavix. She tells me that she was put on this after stent placement for 3 months. She will call Dr. Claretha Cooper office to discuss 1/27; we are using gentamicin under Sorbact. 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 She has a stent. Wound medially is measuring smaller this week. Laterally measuring slightly larger although she has some satellite lesions superiorly 2/3; gentamicin under Sorbact under 4-layer compression. 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 She has a stent on Plavix. Her wounds are measuring smaller this week. More substantially laterally where there is a satellite lesion superiorly. 2/10; gentamicin under Sorbac. 4-layer compression. Patient communicated with Dr. Donzetta Matters at vein and vascular in [place]. He is okay with the patient coming off Plavix I will therefore start her on pentoxifylline for a 1 month trial. In general her wounds look better today. I had some concerns about swelling in the left thigh however she measures 61.5 on the right and 63 on the mid thigh which does not suggest there is any difficulty. The patient is not describing any pain. 2/17; gentamicin under Sorbac 4-layer compression. She has been on pentoxifylline for 1 week and complains of loose stool. No nausea she is eating and drinking well 2/24; the patient apparently  came in 2 days ago for a nurse visit when her wrap fell down. Both areas look a little worse this week macerated medially and satellite lesions laterally. Change to silver alginate today 3/3; wounds are larger today especially medially. She also has more swelling in her foot lower leg and I even noted some swelling in her posterior thigh which is tender. I wonder about the patency of her stent. Fortuitously she sees Dr. Claretha Cooper group on Friday 3/10; Mrs. Ent was seen by vein  and vascular on 3/5. The patient underwent ultrasound. There was no evidence of thrombosis involving the IVC no evidence of thrombosis involving the right common iliac vein there is no evidence of thrombosis involving the right external iliac vein the left external vein is also patent. The right common iliac vein stent appears patent bilateral common femoral veins are compressible and appear patent. I was concerned about the left common iliac stent however it looks like this is functional. She has some edema in the posterior thigh that was tender she still has that this week. I also note they had trouble finding the pulses in her left foot and booked her for an ABI baseline in 4 weeks. She will follow up in 6 months for repeat IVC duplex. The patient stopped the pentoxifylline because of diarrhea. It does not look like that was being effective in any case. I have advised her to go back on her aspirin 81 mg tablet, vascular it also suggested this 3/17; comes in today with her wound surfaces a lot better. The excoriations from last week considerably better probably secondary to the TCA. We have been using silver alginate 3/24; comes in today with smaller wounds both medially and laterally. Both required debridement. There are 2 small satellite areas superiorly laterally. She also has a very odd bandlike area in the mid calf almost looking like there was a weakness in the wrap in a localized area. I would write this off as being this however anteriorly she has a small raised ballotable area that is very tender almost reminiscent of an abscess but there was no obvious purulent surface to it. 02/04/20 upon evaluation today patient appears to be doing fairly well in regard to her wounds today. Fortunately there is no signs of active infection at this time. No fevers, chills, nausea, vomiting, or diarrhea. She has been tolerating the dressing changes without complication. Fortunately I feel like she is  showing signs of improvement although has been sometime since have seen her. Nonetheless the area of concern that Dr. Dellia Nims had last week where she had possibly an area of the wrap that was we can allow the leg to bulge appears to be doing significantly better today there is no signs of anything worsening. AMBRA, HAVERSTICK ([id number]) 4/7; the patient's wounds on her medial and lateral left leg continue to contract. We have been using a regular alginate. Last week she developed an area on the right medial lower leg which is probably a venous ulcer as well. 4/14; the wounds on her left medial and lateral lower leg continue to contract. Surface eschar. We have been using regular alginate. The area on the right medial lower leg is closed. We have been putting both legs under 4-layer contraction. The patient went back to see vein and vascular she had arterial studies done which were apparently "quite good" per the patient although I have not read their notes I have never felt she had an arterial issue. The patient has  refractory lymphedema secondary to severe chronic venous insufficiency. This is been longstanding and refractory to exercise, leg elevation and longstanding use of compression wraps in our clinic as well as compression stockings on the times we have been able to get these to heal 4/21; we thought she actually might be close this week however she arrives in clinic with a lot of edema in her upper left calf and into her posterior thigh. This is been an intermittent problem here. She says the wrap fell down but it was replaced with a nurse visit on Monday. We are using calcium alginate to the wounds and the wound sizes there not terribly larger than last week but there is a lot more edema 4/28; again wound edges are smaller on both sides. Her edema is better controlled than last time. She is obtained her compression pumps from [employer] although they have not been to her home to set  these up. 5/5; left medial and left lateral both look stable. I am not sure the medial is any smaller. We have been using calcium alginate under 4-layer compression. She had an area on the right medial. This was eschared today. We have been wrapping this as well. She does not tolerate external compression stockings due to a history of various contact allergies. She has her compression pumps however the representative from the company is coming on her to show her how to use these tomorrow 5/19; patient with severe chronic venous insufficiency secondary to central venous disease. She had a stent placed in her left common iliac vein. She has done better since but still difficult to control wounds. She comes in today with nothing open on the right leg. Her areas on the left medial and left lateral are just about closed. We are using calcium alginate under 4-layer compression. She is using her external compression pumps at home She only has 15-20 support stockings. States she cannot get anything tighter than that on. 03/30/20-Patient returns at 1 week, the wounds on the left leg are both slightly bigger, the last week she was on 3 layer compression which started to slide down. She is starting to use her lymphedema pumps although she stated on 1 day her right ankle started to swell up and she have to stop that day. Unfortunately the open area seem to oscillate between improving to the point of healing and then flaring up all to do with effectiveness of compression or lack of due to the left leg topography not keeping the compression wraps from rolling down 6/2 patient comes in with a 15/20 mmHg stocking on the right leg. She tells me that she developed a lot of swelling in her ankles she saw orthopedics she was felt to possibly be having a flare of pseudogout versus some other type of arthritis. She was put on steroids for a respiratory issue so that helps with the inflammation. She has not been using the  pumps all week. She thinks the left thigh is more swollen than usual and I would agree with that. She has an appointment with Dr. Donzetta Matters 9 days or so from now 6/9; both wounds on the left medial and left lateral are smaller. We have been using calcium alginate under compression. She does not have an open wound on the right leg she is using a stocking and her compression pumps things are going well. She has an appointment with Dr. Donzetta Matters with regards to her stent in the left common iliac vein 6/16; the wounds  on the left medial and left lateral ankle continues to contract. The patient saw Dr. Donzetta Matters and I think he seems satisfied. Ordered follow-up venous reflux studies on both sides in September. Cautioned that she may need thigh-high stockings. She has been using calcium alginate under compression on the left and her own stocking on the right leg. She tells [place] there are no open wounds on the right 6/23; left lateral is just about closed. Medial required debridement today. We have been using calcium alginate. Extensive discussion about the compression pumps she is only using these on 25 mmHg states she could not take 40 or 30 when the wrap came out to her home to demonstrate these. He said they should not feel tight 6/30; the left lateral wound has a slight amount of eschar. . The area medially is about the same using Hydrofera Blue. 7/7; left lateral wound still has some eschar. I will remove this next week may be closed. The area medially is very small using Hydrofera Blue with improvement. Unfortunately the stockings fell down. Unfortunately the blisters have developed at the edge of where the wrap fell. When this happened she says her legs hurt she did not use her pumps. We are not open Monday for her to come in and change the wraps and she had an appointment yesterday. She also tells me that she is going to have an MRI of her back. She is having pain radiating into her left anterior leg she thinks  her from an L5 disc. She saw Dr. Ellene Route of neurosurgery 7/14; the area on the left lateral ankle area is closed. Still a small area medially however it looks better as well. We have been using Hydrofera Blue under 4-layer compression 7/21; left lateral ankle is still closed however her wound on the medial left calf is actually larger. This is probably because Hydrofera Blue got stuck to the wound. She came in for a nurse change on Friday and will do that again this week I was concerned about the amount of swelling that she had last week however she is using her compression pumps twice a day and the swelling seems well controlled 7/28; remaining wound on the left medial lower leg is smaller. We have been using moistened silver collagen under compression she is coming back for a nurse visit. For reasons that were not really clear she was just keeping her legs elevated and not using her compression pumps. I have asked her to use the compression pumps. She does not have any wounds on the right leg 06/15/20-Patient returns at 2 weeks, her LLE edema is worse and she developed a blister wound that is new and has bigger posterior calf wound on right, we are using Prisma with pad, 4 layer compression. she has been on lasix 40 mg daily 8/18; patient arrives today with things a lot worse than I remember from a few weeks ago. She was seen last week. Noted that her edema was worse and that she now had a left lateral wound as well as deteriorating edema in the medial and posterior part of the lower leg. She says she is using his or her external compression pumps once a day although I wonder about the compliance. Objective CLOTILE, WHITTINGTON ([id number]) Constitutional Patient is hypertensive.. Pulse regular and within target range for patient.Marland Kitchen Respirations regular, non-labored and within target range.. Temperature is normal and within the target range for the patient.Marland Kitchen appears in no distress. Vitals Time Taken:  2:30 AM,  Height: 63 in, Weight: 224.7 lbs, BMI: 39.8, Temperature: 98.3 F, Pulse: 80 bpm, Respiratory Rate: 18 breaths/min, Blood Pressure: 160/85 mmHg. Cardiovascular Pedal pulses are palpable. She does not have good edema control from the left mid calf up. Nonpitting lymphedema. She has dermatofibrosis distally. General Notes: Wound exam; leg edema is not well controlled especially from the mid calf up. I think this is contributing to further skin breakdown. I do not believe she has excessive edema and her left thigh. Integumentary (Hair, Skin) Wound #12 status is Open. Original cause of wound was Gradually Appeared. The wound is located on the Left,Lateral Lower Leg. The wound measures 2cm length x 1.2cm width x 0.1cm depth; 1.885cm^2 area and 0.188cm^3 volume. There is Fat Layer (Subcutaneous Tissue) Exposed exposed. There is a medium amount of serous drainage noted. The wound margin is flat and intact. There is large (67-100%) red granulation within the wound bed. There is no necrotic tissue within the wound bed. Wound #5 status is Open. Original cause of wound was Gradually Appeared. The wound is located on the Left,Medial Lower Leg. The wound measures 2.2cm length x 5.2cm width x 0.1cm depth; 8.985cm^2 area and 0.898cm^3 volume. There is a medium amount of serosanguineous drainage noted. The wound margin is flat and intact. There is small (1-33%) red granulation within the wound bed. There is a large (67-100%) amount of necrotic tissue within the wound bed including Adherent Slough. 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 Wound #5 Left,Medial Lower Leg: Topical Lidocaine 4% cream applied to wound bed prior to debridement (In Clinic Only). Primary Wound Dressing: Wound #12 Left,Lateral Lower Leg: Silver Alginate Wound #5 Left,Medial Lower Leg: Silver Alginate Secondary Dressing: Wound #12 Left,Lateral Lower Leg: ABD pad Wound #5 Left,Medial Lower Leg: ABD pad Dressing Change Frequency: Wound #12 Left,Lateral Lower Leg: Change dressing every week Other: - Nurse visit Friday and Monday if needed. Wound #5 Left,Medial Lower Leg: Change dressing every week Other: - Nurse visit Friday and Monday if needed. Follow-up Appointments: Wound #12 Left,Lateral Lower Leg: Return Appointment in 1 week. Nurse Visit as needed Wound #5 Left,Medial Lower Leg: Return Appointment in 1 week. HAWRAA, STAMBAUGH ([id number]) Nurse Visit as needed Edema Control: Wound #12 Left,Lateral Lower Leg: 4-Layer Compression System - Left Lower Extremity. Patient to wear own compression stockings - On Right Compression Pump: Use compression pump on left lower extremity for 60 minutes, twice daily. Compression Pump: Use compression pump on right lower extremity for 60 minutes, twice daily. Wound #5 Left,Medial Lower Leg: 4-Layer Compression System - Left Lower Extremity. Patient to wear own compression stockings - On Right Compression Pump: Use compression pump on left lower extremity for 60 minutes, twice daily. Compression Pump: Use compression pump on right lower extremity for 60 minutes, twice daily. 1. This patient has severe chronic venous insufficiency including May [name]'s physiology with a left common iliac stent 2. Her edema control is not good and I am doubtful she uses her pumps at all reliably although she says she is using them once a day. I  have  previously asked her to go to twice a day and I asked her to do this again I am very concerned about the area in the posterior calf which is red angry and draining I think secondary to insufficient edema control she is also on Lasix 40 mg 3. Follow-up next week, she has a wound nurse visit on Friday to change the dressing Electronic Signature(s) Signed: 06/22/2020 4:48:02 PM By: Linton Ham MD Entered By: Linton Ham on 06/22/2020 15:16:03 Kenton, [name] ([id number]) -------------------------------------------------------------------------------- SuperBill Details Patient Name: RELENA, IVANCIC. Date of Service: 06/22/2020 Medical Record Number: [id number] Patient Account Number: [id number] Date of Birth/Sex: [birth year] (74 y.o. 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

## 2020-06-25 ENCOUNTER — Ambulatory Visit
Admission: RE | Admit: 2020-06-25 | Discharge: 2020-06-25 | Disposition: A | Discharge status: Home / Self Care | Payer: Medicare Other | Source: Ambulatory Visit | Attending: Neurological Surgery | Admitting: Neurological Surgery

## 2020-06-25 DIAGNOSIS — M5416 Radiculopathy, lumbar region: Secondary | ICD-10-CM

## 2020-06-25 DIAGNOSIS — M48061 Spinal stenosis, lumbar region without neurogenic claudication: Secondary | ICD-10-CM | POA: Diagnosis not present

## 2020-06-27 ENCOUNTER — Other Ambulatory Visit: Payer: Self-pay

## 2020-06-27 DIAGNOSIS — I82592 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: Secondary | ICD-10-CM | POA: Diagnosis not present

## 2020-06-27 DIAGNOSIS — J45909 Unspecified asthma, uncomplicated: Secondary | ICD-10-CM | POA: Diagnosis not present

## 2020-06-27 DIAGNOSIS — I890 Lymphedema, not elsewhere classified: Secondary | ICD-10-CM | POA: Diagnosis not present

## 2020-06-27 DIAGNOSIS — L97221 Non-pressure chronic ulcer of left calf limited to breakdown of skin: Secondary | ICD-10-CM | POA: Diagnosis not present

## 2020-06-27 DIAGNOSIS — I87332 Chronic venous hypertension (idiopathic) with ulcer and inflammation of left lower extremity: Secondary | ICD-10-CM | POA: Diagnosis not present

## 2020-06-27 DIAGNOSIS — I10 Essential (primary) hypertension: Secondary | ICD-10-CM | POA: Diagnosis not present

## 2020-06-29 ENCOUNTER — Encounter: Payer: Medicare Other | Admitting: Internal Medicine

## 2020-06-29 ENCOUNTER — Other Ambulatory Visit: Payer: Self-pay

## 2020-06-29 DIAGNOSIS — I82592 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: Secondary | ICD-10-CM | POA: Diagnosis not present

## 2020-06-29 DIAGNOSIS — I890 Lymphedema, not elsewhere classified: Secondary | ICD-10-CM | POA: Diagnosis not present

## 2020-06-29 DIAGNOSIS — I872 Venous insufficiency (chronic) (peripheral): Secondary | ICD-10-CM | POA: Diagnosis not present

## 2020-06-29 DIAGNOSIS — L97221 Non-pressure chronic ulcer of left calf limited to breakdown of skin: Secondary | ICD-10-CM | POA: Diagnosis not present

## 2020-06-29 DIAGNOSIS — L97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d: Secondary | ICD-10-CM | POA: Diagnosis not present

## 2020-06-29 DIAGNOSIS — J45909 Unspecified asthma, uncomplicated: Secondary | ICD-10-CM | POA: Diagnosis not present

## 2020-06-29 DIAGNOSIS — M5416 Radiculopathy, lumbar region: Secondary | ICD-10-CM | POA: Diagnosis not present

## 2020-06-29 DIAGNOSIS — I10 Essential (primary) hypertension: Secondary | ICD-10-CM | POA: Diagnosis not present

## 2020-06-29 DIAGNOSIS — M4013 Other secondary kyphosis, cervicothoracic region: Secondary | ICD-10-CM | POA: Diagnosis not present

## 2020-06-29 DIAGNOSIS — I87332 Chronic venous hypertension (idiopathic) with ulcer and inflammation of left lower extremity: Secondary | ICD-10-CM | POA: Diagnosis not present

## 2020-06-29 NOTE — Progress Notes (Signed)
[name] ([id number]) Visit Report for 06/24/2020 Arrival Information Details Patient Name: [name], [name]. Date of Service: 06/24/2020 8:15 AM Medical Record Number: [id number] Patient Account Number: [id number] Date of Birth/Sex: 12-01-1945 (74 y.o. F) Treating RN: Cornell Barman Primary Care Seila Liston: [name] Other Clinician: Referring Rilei Kravitz: [name] Treating [employer] Null/Extender: Melburn Hake, HOYT Weeks in Treatment: 87 Visit Information History Since Last Visit All ordered tests and consults were completed: No Patient Arrived: Ambulatory Added or deleted any medications: No Arrival Time: 09:08 Any new allergies or adverse reactions: No Accompanied By: self Had a fall or experienced change in No Transfer Assistance: None activities of daily living that may affect Patient Requires Transmission-Based No risk of falls: Precautions: Signs or symptoms of abuse/neglect since last visito No Patient Has Alerts: Yes Hospitalized since last visit: No Patient Alerts: Patient on Blood Implantable device outside of the clinic excluding No Thinner cellular tissue based products placed in the center aspirin 81 since last visit: Has Dressing in Place as Prescribed: Yes Has Compression in Place as Prescribed: Yes Pain Present Now: No Electronic Signature(s) Signed: 06/28/2020 7:43:28 PM By: [name], [name], RN, CWS, [name], [name] Entered By: [name], [name], RN, CWS, Kim on 06/24/2020 09:40:76 [name] ([id number]) -------------------------------------------------------------------------------- Compression Therapy Details Patient Name: [name]. Date of Service: 06/24/2020 8:15 AM Medical Record Number: [id number] Patient Account Number: [id number] Date of Birth/Sex: [name] 14, 1946 (74 y.o. F) Treating RN: Cornell Barman Primary Care Damika Harmon: [name] Other Clinician: Referring Ashyra Cantin: [name] Treating Norvil Martensen/Extender: Melburn Hake, HOYT Weeks in  Treatment: 80 Compression Therapy Performed for Wound Assessment: Wound #12 Left,Lateral Lower Leg Performed By: Clinician Cornell Barman, RN Compression Type: Four Layer Pre Treatment ABI: 1 Electronic Signature(s) Signed: 06/24/2020 9:10:16 AM By: [name], [name], RN, CWS, [name], [name] Entered By: [name], [name], RN, CWS, Kim on 06/24/2020 09:10:15 [name] ([id number]) -------------------------------------------------------------------------------- Compression Therapy Details Patient Name: [name]. Date of Service: 06/24/2020 8:15 AM Medical Record Number: [id number] Patient Account Number: [id number] Date of Birth/Sex: 05-Nov-1946 (74 y.o. F) Treating RN: Cornell Barman Primary Care Tevita Gomer: [name] Other Clinician: Referring Pellegrino [name]: [name] Treating Virgie Kunda/Extender: Melburn Hake, HOYT Weeks in Treatment: 80 Compression Therapy Performed for Wound Assessment: Wound #5 Left,Medial Lower Leg Performed By: Clinician Cornell Barman, RN Compression Type: Four Layer Pre Treatment ABI: 1 Electronic Signature(s) Signed: 06/24/2020 9:10:16 AM By: [name], [name], RN, CWS, [name], [name] Entered By: [name], [name], RN, CWS, Kim on 06/24/2020 09:10:16 [name] ([id number]) -------------------------------------------------------------------------------- Encounter Discharge Information Details Patient Name: [name]. Date of Service: 06/24/2020 8:15 AM Medical Record Number: [id number] Patient Account Number: [id number] Date of Birth/Sex: 1946/03/01 (74 y.o. F) Treating RN: Cornell Barman Primary Care Arwilda Georgia: [name] Other Clinician: Referring [name]: [name] Treating Aneliz Carbary/Extender: Melburn Hake, HOYT Weeks in Treatment: 80 Encounter Discharge Information Items Discharge Condition: Stable Ambulatory Status: Ambulatory Discharge Destination: Home Transportation: Private Auto Accompanied By: self Schedule Follow-up Appointment: Yes Clinical Summary of  Care: Electronic Signature(s) Signed: 06/24/2020 9:12:04 AM By: [name], [name], RN, CWS, [name], [name] Entered By: [name], [name], RN, CWS, Kim on 06/24/2020 09:12:03 [name] ([id number]) -------------------------------------------------------------------------------- Wound Assessment Details Patient Name: [name], [name]. Date of Service: 06/24/2020 8:15 AM Medical Record Number: [id number] Patient Account Number: [id number] Date of Birth/Sex: [birth year] (74 y.o. F) Treating RN: Cornell Barman Primary Care [place] [name]: [name] Other Clinician: Referring Talana Slatten: [name] Treating Shermaine Rivet/Extender: STONE III, HOYT Weeks in Treatment: 80 Wound Status Wound Number: 12 Primary  Venous Leg Ulcer Etiology: Wound Location: Left, Lateral Lower Leg Wound Open Wounding Event: Gradually Appeared Status: Date Acquired: 06/15/2020 Comorbid Cataracts, Asthma, Sleep Apnea, Deep Vein Thrombosis, Weeks Of Treatment: 1 History: Hypertension, Peripheral Venous Disease, Osteoarthritis, Clustered Wound: No Received Chemotherapy, Received Radiation Photos Wound Measurements Length: (cm) 2.7 Width: (cm) 1.6 Depth: (cm) 0.1 Area: (cm) 3.393 Volume: (cm) 0.339 % Reduction in Area: -500.5% % Reduction in Volume: -494.7% Epithelialization: None Wound Description Classification: Partial Thickness Wound Margin: Flat and Intact Exudate Amount: Medium Exudate Type: Serous Exudate Color: amber Foul Odor After Cleansing: No Slough/Fibrino No Wound Bed Granulation Amount: Large (67-100%) Exposed Structure Granulation Quality: Red Fascia Exposed: No Necrotic Amount: None Present (0%) Fat Layer (Subcutaneous Tissue) Exposed: Yes Tendon Exposed: No Muscle Exposed: No Joint Exposed: No Bone Exposed: No Electronic Signature(s) Signed: 06/24/2020 12:00:26 PM By: [name] Signed: 06/28/2020 7:43:28 PM By: [name], [name], RN, CWS, [name], [name] Entered By: [name] on 06/24/2020  08:48:03 Mcguinn, [name] ([id number]) -------------------------------------------------------------------------------- Wound Assessment Details Patient Name: YOCELYN, BROCIOUS. Date of Service: 06/24/2020 8:15 AM Medical Record Number: [id number] Patient Account Number: [id number] Date of Birth/Sex: Mar 05, 1946 (74 y.o. F) Treating RN: Cornell Barman Primary Care Priti Consoli: [name] Other Clinician: Referring Tyese Finken: [name] Treating Makylee Sanborn/Extender: Melburn Hake, HOYT Weeks in Treatment: 80 Wound Status Wound Number: 5 Primary Lymphedema Etiology: Wound Location: Left, Medial Lower Leg Wound Open Wounding Event: Gradually Appeared Status: Date Acquired: 11/19/2018 Comorbid Cataracts, Asthma, Sleep Apnea, Deep Vein Thrombosis, Weeks Of Treatment: 80 History: Hypertension, Peripheral Venous Disease, Osteoarthritis, Clustered Wound: No Received Chemotherapy, Received Radiation Photos Wound Measurements Length: (cm) 2.2 Width: (cm) 5 Depth: (cm) 0.1 Area: (cm) 8.639 Volume: (cm) 0.864 % Reduction in Area: -1.1% % Reduction in Volume: -1.1% Epithelialization: None Wound Description Classification: Full Thickness Without Exposed Support Structures Wound Margin: Flat and Intact Exudate Amount: Medium Exudate Type: Serosanguineous Exudate Color: red, brown Foul Odor After Cleansing: No Slough/Fibrino Yes Wound Bed Granulation Amount: Small (1-33%) Granulation Quality: Red Necrotic Amount: Large (67-100%) Necrotic Quality: Adherent Therapist, music) Signed: 06/24/2020 12:00:26 PM By: [name] Signed: 06/28/2020 7:43:28 PM By: [name], [name], RN, CWS, [name], [name] Entered By: [name] on 06/24/2020 08:48:27

## 2020-06-29 NOTE — Progress Notes (Signed)
KAYLYNE, AXTON ([id number]) Visit Report for 06/27/2020 Arrival Information Details Patient Name: [name], [name]. Date of Service: 06/27/2020 10:45 AM Medical Record Number: [id number] Patient Account Number: [id number] Date of Birth/Sex: 05-11-1946 (74 y.o. F) Treating RN: Cornell Barman Primary Care Kellie Murrill: [name] Other Clinician: Referring Addalyn Speedy: [name] Treating Kiegan Macaraeg/Extender: Melburn Hake, HOYT Weeks in Treatment: 44 Visit Information History Since Last Visit All ordered tests and consults were completed: No Patient Arrived: Ambulatory Added or deleted any medications: No Arrival Time: 11:23 Any new allergies or adverse reactions: No Accompanied By: self Had a fall or experienced change in No Transfer Assistance: None activities of daily living that may affect Patient Identification Verified: Yes risk of falls: Patient Requires Transmission-Based No Signs or symptoms of abuse/neglect since last visito No Precautions: Hospitalized since last visit: No Patient Has Alerts: Yes Implantable device outside of the clinic excluding No Patient Alerts: Patient on Blood cellular tissue based products placed in the center Thinner since last visit: aspirin 81 Has Dressing in Place as Prescribed: Yes Has Compression in Place as Prescribed: Yes Pain Present Now: No Electronic Signature(s) Signed: 06/28/2020 11:15:54 AM By: [name] Entered By: [name] on 06/27/2020 11:24:27 Whitwell, [name] ([id number]) -------------------------------------------------------------------------------- Compression Therapy Details Patient Name: [name], [name]. Date of Service: 06/27/2020 10:45 AM Medical Record Number: [id number] Patient Account Number: [id number] Date of Birth/Sex: 1945/11/19 (74 y.o. F) Treating RN: [name] Canavan Primary Care Lowana Hable: [name] Other Clinician: Referring Peyten Weare: [name] Treating Jahnyla Parrillo/Extender: Melburn Hake, HOYT Weeks in Treatment: 80 Compression Therapy Performed for Wound Assessment: Wound #5 Left,Medial Lower Leg Performed By: Ponciano Ort, RN Compression Type: Four Layer Pre Treatment ABI: 1 Electronic Signature(s) Signed: 06/27/2020 4:26:28 PM By: [name] Canavan Entered By: [name] Canavan on 06/27/2020 12:00:09 LUSERO, NORDLUND ([id number]) -------------------------------------------------------------------------------- Compression Therapy Details Patient Name: [name], [name]. Date of Service: 06/27/2020 10:45 AM Medical Record Number: [id number] Patient Account Number: [id number] Date of Birth/Sex: February 20, 1946 (74 y.o. F) Treating RN: [name] Canavan Primary Care Makhia Vosler: [name] Other Clinician: Referring Klinton Candelas: [name] Treating Eliyah Bazzi/Extender: Melburn Hake, HOYT Weeks in Treatment: 80 Compression Therapy Performed for Wound Assessment: Wound #12 Left,Lateral Lower Leg Performed By: Ponciano Ort, RN Compression Type: Four Layer Pre Treatment ABI: 1 Electronic Signature(s) Signed: 06/27/2020 4:26:28 PM By: [name] Canavan Entered By: [name] Canavan on 06/27/2020 12:00:09 [name], [name] ([id number]) -------------------------------------------------------------------------------- Encounter Discharge Information Details Patient Name: [name], [name]. Date of Service: 06/27/2020 10:45 AM Medical Record Number: [id number] Patient Account Number: [id number] Date of Birth/Sex: 1946-02-20 (74 y.o. F) Treating RN: [name] Canavan Primary Care Kellene Mccleary: [name] Other Clinician: Referring Bulmaro Feagans: [name] Treating Levy Wellman/Extender: Melburn Hake, HOYT Weeks in Treatment: 80 Encounter Discharge Information Items Discharge Condition: Stable Ambulatory Status: Ambulatory Discharge Destination: Home Transportation: Private Auto Accompanied By: self Schedule Follow-up Appointment: Yes Clinical Summary of  Care: Electronic Signature(s) Signed: 06/27/2020 4:26:28 PM By: [name] Canavan Entered By: [name] Canavan on 06/27/2020 12:02:52 [name], [name] ([id number]) -------------------------------------------------------------------------------- Wound Assessment Details Patient Name: [name], [name]. Date of Service: 06/27/2020 10:45 AM Medical Record Number: [id number] Patient Account Number: [id number] Date of Birth/Sex: August 18, 1946 (74 y.o. F) Treating RN: Cornell Barman Primary Care Gaudencio Chesnut: [name] Other Clinician: Referring Emira Eubanks: [name] Treating Lorren Rossetti/Extender: Melburn Hake, HOYT Weeks in Treatment: 80 Wound Status Wound Number: 12 Primary Venous Leg Ulcer Etiology: Wound Location: Left, Lateral Lower Leg Wound Open Wounding Event: Gradually Appeared Status: Date Acquired: 06/15/2020 Comorbid Cataracts, Asthma, Sleep Apnea, Deep Vein Thrombosis,  Weeks Of Treatment: 1 History: Hypertension, Peripheral Venous Disease, Osteoarthritis, Clustered Wound: No Received Chemotherapy, Received Radiation Photos Wound Measurements Length: (cm) 2.5 Width: (cm) 1.6 Depth: (cm) 0.1 Area: (cm) 3.142 Volume: (cm) 0.314 % Reduction in Area: -456.1% % Reduction in Volume: -450.9% Epithelialization: None Wound Description Classification: Partial Thickness Fou Wound Margin: Flat and Intact Slo Exudate Amount: Medium Exudate Type: Serous Exudate Color: amber l Odor After Cleansing: No ugh/Fibrino No Wound Bed Granulation Amount: Large (67-100%) Exposed Structure Granulation Quality: Red Fascia Exposed: No Necrotic Amount: None Present (0%) Fat Layer (Subcutaneous Tissue) Exposed: Yes Tendon Exposed: No Muscle Exposed: No Joint Exposed: No Bone Exposed: No Treatment Notes Wound #12 (Left, Lateral Lower Leg) Notes SCell, abd, 4LL, unna to [employer]) [name], [name] ([id number]) Signed: 06/28/2020 11:15:54 AM By: [name] Signed:  06/28/2020 7:43:28 PM By: [name], [name], RN, CWS, [name], [name] Entered By: [name] on 06/27/2020 11:29:05 [name], [name] ([id number]) -------------------------------------------------------------------------------- Wound Assessment Details Patient Name: [name], [name]. Date of Service: 06/27/2020 10:45 AM Medical Record Number: [id number] Patient Account Number: [id number] Date of Birth/Sex: 08/17/1946 (74 y.o. F) Treating RN: Cornell Barman Primary Care Mandel Seiden: [name] Other Clinician: Referring Uri Covey: [name] Treating Shaterrica Territo/Extender: Melburn Hake, HOYT Weeks in Treatment: 80 Wound Status Wound Number: 5 Primary Lymphedema Etiology: Wound Location: Left, Medial Lower Leg Wound Open Wounding Event: Gradually Appeared Status: Date Acquired: 11/19/2018 Comorbid Cataracts, Asthma, Sleep Apnea, Deep Vein Thrombosis, Weeks Of Treatment: 80 History: Hypertension, Peripheral Venous Disease, Osteoarthritis, Clustered Wound: No Received Chemotherapy, Received Radiation Photos Wound Measurements Length: (cm) 2.5 Width: (cm) 3 Depth: (cm) 0.1 Area: (cm) 5.89 Volume: (cm) 0.589 % Reduction in Area: 31.1% % Reduction in Volume: 31.1% Epithelialization: None Wound Description Classification: Full Thickness Without Exposed Support Structu Wound Margin: Flat and Intact Exudate Amount: Medium Exudate Type: Serosanguineous Exudate Color: red, brown res Foul Odor After Cleansing: No Slough/Fibrino Yes Wound Bed Granulation Amount: Small (1-33%) Granulation Quality: Red Necrotic Amount: Large (67-100%) Necrotic Quality: Adherent Slough Treatment Notes Wound #5 (Left, Medial Lower Leg) Notes SCell, abd, 4LL, unna to anchor Electronic Signature(s) Signed: 06/28/2020 11:15:54 AM By: [name] Signed: 06/28/2020 7:43:28 PM By: [name], [name], RN, CWS, [name], [name] Entered By: [name] on 06/27/2020 11:29:37 [place], [name] ([id number])

## 2020-06-30 NOTE — Progress Notes (Signed)
[name] ([id number]) Visit Report for 06/29/2020 Arrival Information Details Patient Name: [name], [name]. Date of Service: 06/29/2020 2:15 PM Medical Record Number: [id number] Patient Account Number: [id number] Date of Birth/Sex: [birth year] (74 y.o. F) Treating RN: Cornell Barman Primary Care Ronal Maybury: [name] Other Clinician: Referring Melisa Donofrio: [name] Treating Kreed Kauffman/Extender: Tito Dine in Treatment: 76 Visit Information History Since Last Visit All ordered tests and consults were completed: No Patient Arrived: Ambulatory Added or deleted any medications: No Arrival Time: 15:04 Any new allergies or adverse reactions: No Accompanied By: self Had a fall or experienced change in No Transfer Assistance: None activities of daily living that may affect Patient Identification Verified: Yes risk of falls: Secondary Verification Process Completed: Yes Signs or symptoms of abuse/neglect since last visito No Patient Requires Transmission-Based No Hospitalized since last visit: No Precautions: Implantable device outside of the clinic excluding No Patient Has Alerts: Yes cellular tissue based products placed in the center Patient Alerts: Patient on Blood since last visit: Thinner Pain Present Now: No aspirin 81 Electronic Signature(s) Signed: 06/29/2020 4:50:41 PM By: [name] Entered By: [name] on 06/29/2020 15:05:21 [name] ([id number]) -------------------------------------------------------------------------------- Compression Therapy Details Patient Name: [name], [name]. Date of Service: 06/29/2020 2:15 PM Medical Record Number: [id number] Patient Account Number: [id number] Date of Birth/Sex: [birth year] (74 y.o. F) Treating RN: Cornell Barman Primary Care Adna Nofziger: [name] Other Clinician: Referring Zykeriah Mathia: [name] Treating Cem Kosman/Extender: Tito Dine in Treatment: 81 Compression  Therapy Performed for Wound Assessment: Wound #12 Left,Lateral Lower Leg Performed By: Clinician Cornell Barman, RN Compression Type: Four Layer Post Procedure Diagnosis Same as Pre-procedure Electronic Signature(s) Signed: 06/29/2020 5:45:00 PM By: [name], [name], RN, CWS, [name], [name] Entered By: [name], [name], RN, CWS, Kim on 06/29/2020 15:33:12 [name] ([id number]) -------------------------------------------------------------------------------- Compression Therapy Details Patient Name: [name], [name]. Date of Service: 06/29/2020 2:15 PM Medical Record Number: [id number] Patient Account Number: [id number] Date of Birth/Sex: 19-Feb-1946 (74 y.o. F) Treating RN: Cornell Barman Primary Care Johnthomas Lader: [name] Other Clinician: Referring Mariesha Venturella: [name] Treating Lizann Edelman/Extender: Tito Dine in Treatment: 81 Compression Therapy Performed for Wound Assessment: Wound #5 Left,Medial Lower Leg Performed By: Clinician Cornell Barman, RN Compression Type: Four Layer Post Procedure Diagnosis Same as Pre-procedure Electronic Signature(s) Signed: 06/29/2020 5:45:00 PM By: [name], [name], RN, CWS, [name], [name] Entered By: [name], [name], RN, CWS, Kim on 06/29/2020 15:33:12 [name] ([id number]) -------------------------------------------------------------------------------- Compression Therapy Details Patient Name: [name], [name]. Date of Service: 06/29/2020 2:15 PM Medical Record Number: [id number] Patient Account Number: [id number] Date of Birth/Sex: 05/22/1946 (74 y.o. F) Treating RN: Cornell Barman Primary Care Devondre Guzzetta: [name] Other Clinician: Referring Yuval Rubens: [name] Treating Barbera Perritt/Extender: Tito Dine in Treatment: 81 Compression Therapy Performed for Wound Assessment: NonWound Condition Lymphedema - Right Leg Performed By: Clinician Cornell Barman, RN Compression Type: Four Layer Post Procedure Diagnosis Same as  Pre-procedure Electronic Signature(s) Signed: 06/29/2020 5:45:00 PM By: [name], [name], RN, CWS, [name], [name] Entered By: [name], [name], RN, CWS, Kim on 06/29/2020 15:33:33 [name] ([id number]) -------------------------------------------------------------------------------- Encounter Discharge Information Details Patient Name: [name]. Date of Service: 06/29/2020 2:15 PM Medical Record Number: [id number] Patient Account Number: [id number] Date of Birth/Sex: 04-Feb-1946 (74 y.o. F) Treating RN: Cornell Barman Primary Care Giavonni Cizek: [name] Other Clinician: Referring Aireanna Luellen: [name] Treating Tallen Schnorr/Extender: Tito Dine in Treatment: 19 Encounter Discharge Information Items Discharge Condition: Stable Ambulatory Status: Ambulatory Discharge Destination: Home Transportation:  Private Auto Accompanied By: self Schedule Follow-up Appointment: Yes Clinical Summary of Care: Electronic Signature(s) Signed: 06/29/2020 5:45:00 PM By: [name], [name], RN, CWS, [name], [name] Entered By: [name], [name], RN, CWS, Kim on 06/29/2020 15:37:28 [name] ([id number]) -------------------------------------------------------------------------------- Lower Extremity Assessment Details Patient Name: [name], [name]. Date of Service: 06/29/2020 2:15 PM Medical Record Number: [id number] Patient Account Number: [id number] Date of Birth/Sex: 22-May-1946 (74 y.o. F) Treating RN: Cornell Barman Primary Care Lachandra Dettmann: [name] Other Clinician: Referring Haylie Mccutcheon: [name] Treating Kammy Klett/Extender: Tito Dine in Treatment: 81 Edema Assessment Assessed: [Left: Yes] [Right: Yes] Edema: [Left: Yes] [Right: Yes] Calf Left: Right: Point of Measurement: 33 cm From Medial Instep 37.5 cm 39 cm Ankle Left: Right: Point of Measurement: 12 cm From Medial Instep 22.5 cm 24 cm Vascular Assessment Pulses: Dorsalis Pedis Palpable: [Left:Yes]  [Right:Yes] Posterior Tibial Palpable: [Left:Yes] [Right:Yes] Electronic Signature(s) Signed: 06/29/2020 4:50:41 PM By: [name] Signed: 06/29/2020 5:45:00 PM By: [name], [name], RN, CWS, [name], [name] Entered By: [name] on 06/29/2020 15:14:12 [place], Tenna [name] ([id number]) -------------------------------------------------------------------------------- Multi Wound Chart Details Patient Name: [name], [name]. Date of Service: 06/29/2020 2:15 PM Medical Record Number: [id number] Patient Account Number: [id number] Date of Birth/Sex: 1946-01-19 (74 y.o. F) Treating RN: Cornell Barman Primary Care Alexsus Papadopoulos: [name] Other Clinician: Referring Chandlar Staebell: [name] Treating Orlena Garmon/Extender: Tito Dine in Treatment: 69 Vital Signs Height(in): 74 Pulse(bpm): 2 Weight(lbs): 224.7 Blood Pressure(mmHg): 151/73 Body Mass Index(BMI): 40 Temperature(F): 98.4 Respiratory Rate(breaths/min): 20 Photos: [N/A:N/A] Wound Location: Left, Lateral Lower Leg Left, Medial Lower Leg N/A Wounding Event: Gradually Appeared Gradually Appeared N/A Primary Etiology: Venous Leg Ulcer Lymphedema N/A Comorbid History: Cataracts, Asthma, Sleep Apnea, Cataracts, Asthma, Sleep Apnea, N/A Deep Vein Thrombosis, Deep Vein Thrombosis, Hypertension, Peripheral Venous Hypertension, Peripheral Venous Disease, Osteoarthritis, Received Disease, Osteoarthritis, Received Chemotherapy, Received Radiation Chemotherapy, Received Radiation Date Acquired: 06/15/2020 11/19/2018 N/A Weeks of Treatment: 2 81 N/A Wound Status: Open Open N/A Measurements L x W x D (cm) 2x1.5x0.1 3x3x0.1 N/A Area (cm) : 2.356 7.069 N/A Volume (cm) : 0.236 0.707 N/A % Reduction in Area: -317.00% 17.30% N/A % Reduction in Volume: -314.00% 17.30% N/A Classification: Partial Thickness Full Thickness Without Exposed N/A Support Structures Exudate Amount: Medium Medium N/A Exudate Type: Serous Serosanguineous  N/A Exudate Color: amber red, brown N/A Wound Margin: Flat and Intact Flat and Intact N/A Granulation Amount: Large (67-100%) Small (1-33%) N/A Granulation Quality: Red Red N/A Necrotic Amount: None Present (0%) Large (67-100%) N/A Exposed Structures: Fat Layer (Subcutaneous Tissue): N/A N/A Yes Fascia: No Tendon: No Muscle: No Joint: No Bone: No Epithelialization: None None N/A Procedures Performed: Compression Therapy Compression Therapy N/A Treatment Notes Wound #12 (Left, Lateral Lower Leg) Notes Hydrafera blue, abd, 4LL, unna to anchor KEIRSTON, SAEPHANH. ([id number]) Wound #5 (Left, Medial Lower Leg) Notes Hydrafera blue, abd, 4LL, unna to anchor Electronic Signature(s) Signed: 06/30/2020 4:51:35 PM By: Linton Ham MD Entered By: Linton Ham on 06/29/2020 16:10:01 Odriscoll, Tenna [name] ([id number]) -------------------------------------------------------------------------------- Multi-Disciplinary Care Plan Details Patient Name: [name], [name]. Date of Service: 06/29/2020 2:15 PM Medical Record Number: [id number] Patient Account Number: [id number] Date of Birth/Sex: [birth year] (74 y.o. F) Treating RN: Cornell Barman Primary Care Zilah Villaflor: [name] Other Clinician: Referring Zekiah Coen: [name] Treating Malosi Hemstreet/Extender: Tito Dine in Treatment: 22 Active Inactive Medication Nursing Diagnoses: Knowledge deficit related to medication safety: actual or potential Goals: Patient/caregiver will demonstrate understanding of new oral/IV medications prescribed at the [facility] (topical prescriptions are covered  under the skin breakdown problem) Date Initiated: 12/16/2019 Target Resolution Date: 01/13/2020 Goal Status: Active Interventions: Assess for medication contraindications each visit where new medications are prescribed Treatment Activities: New medication prescribed at [place] : 12/16/2019 Notes: Soft Tissue Infection Nursing  Diagnoses: Impaired tissue integrity Goals: Patient's soft tissue infection will resolve Date Initiated: 12/10/2018 Target Resolution Date: 01/09/2019 Goal Status: Active Interventions: Assess signs and symptoms of infection every visit Notes: Venous Leg Ulcer Nursing Diagnoses: Actual venous Insuffiency (use after diagnosis is confirmed) Goals: Patient will maintain optimal edema control Date Initiated: 12/10/2018 Target Resolution Date: 01/09/2019 Goal Status: Active Interventions: Assess peripheral edema status every visit. Treatment Activities: Therapeutic compression applied : 12/10/2018 Notes: Wound/Skin Impairment Nursing Diagnoses: ROKHAYA, QUINN ([id number]) Impaired tissue integrity Goals: Patient/caregiver will verbalize understanding of skin care regimen Date Initiated: 12/10/2018 Target Resolution Date: 01/09/2019 Goal Status: Active Interventions: Assess ulceration(s) every visit Treatment Activities: Topical wound management initiated : 12/10/2018 Notes: Electronic Signature(s) Signed: 06/29/2020 5:45:00 PM By: [name], [name], RN, CWS, [name], [name] Entered By: [name], [name], RN, CWS, Kim on 06/29/2020 15:32:26 [name], [name] ([id number]) -------------------------------------------------------------------------------- Pain Assessment Details Patient Name: [name], [name]. Date of Service: 06/29/2020 2:15 PM Medical Record Number: [id number] Patient Account Number: [id number] Date of Birth/Sex: [birth year] (74 y.o. F) Treating RN: Cornell Barman Primary Care Deardra Hinkley: [name] Other Clinician: Referring Valeta Paz: [name] Treating Mustaf Antonacci/Extender: Tito Dine in Treatment: 79 Active Problems Location of Pain Severity and Description of Pain Patient Has Paino No Site Locations With Dressing Change: No Pain Management and Medication Current Pain Management: Electronic Signature(s) Signed: 06/29/2020 4:50:41 PM By: [name] Signed:  06/29/2020 5:45:00 PM By: [name], [name], RN, CWS, [name], [name] Entered By: [name] on 06/29/2020 15:06:25 [name], [name] ([id number]) -------------------------------------------------------------------------------- Patient/Caregiver Education Details Patient Name: [name], [name]. Date of Service: 06/29/2020 2:15 PM Medical Record Number: [id number] Patient Account Number: [id number] Date of Birth/Gender: [birth year] (74 y.o. F) Treating RN: Cornell Barman Primary Care Physician: [name] Other Clinician: Referring Physician: [name] Treating Physician/Extender: Tito Dine in Treatment: 65 Education Assessment Education Provided To: Patient Education Topics Provided Venous: Handouts: Controlling Swelling with Compression Stockings Methods: Demonstration, Explain/Verbal Responses: State content correctly Electronic Signature(s) Signed: 06/29/2020 5:45:00 PM By: [name], [name], RN, CWS, [name], [name] Entered By: [name], [name], RN, CWS, Kim on 06/29/2020 15:36:09 [name] ([id number]) -------------------------------------------------------------------------------- Wound Assessment Details Patient Name: [name], RELIFORD. Date of Service: 06/29/2020 2:15 PM Medical Record Number: [id number] Patient Account Number: [id number] Date of Birth/Sex: [birth year] (74 y.o. F) Treating RN: Cornell Barman Primary Care Kindsey Eblin: [name] Other Clinician: Referring Caera Enwright: [name] Treating Libertie Hausler/Extender: Tito Dine in Treatment: 81 Wound Status Wound Number: 12 Primary Venous Leg Ulcer Etiology: Wound Location: Left, Lateral Lower Leg Wound Open Wounding Event: Gradually Appeared Status: Date Acquired: 06/15/2020 Comorbid Cataracts, Asthma, Sleep Apnea, Deep Vein Thrombosis, Weeks Of Treatment: 2 History: Hypertension, Peripheral Venous Disease, Osteoarthritis, Clustered Wound: No Received Chemotherapy, Received  Radiation Photos Wound Measurements Length: (cm) 2 Width: (cm) 1.5 Depth: (cm) 0.1 Area: (cm) 2.356 Volume: (cm) 0.236 % Reduction in Area: -317% % Reduction in Volume: -314% Epithelialization: None Wound Description Classification: Partial Thickness Fo Wound Margin: Flat and Intact Sl Exudate Amount: Medium Exudate Type: Serous Exudate Color: amber ul Odor After Cleansing: No ough/Fibrino No Wound Bed Granulation Amount: Large (67-100%) Exposed Structure Granulation Quality: Red Fascia Exposed: No Necrotic Amount: None Present (0%) Fat Layer (Subcutaneous Tissue) Exposed: Yes Tendon  Exposed: No Muscle Exposed: No Joint Exposed: No Bone Exposed: No Treatment Notes Wound #12 (Left, Lateral Lower Leg) Notes Hydrafera blue, abd, 4LL, unna to anchor Electronic Signature(s) SHALETTA, HINOSTROZA ([id number]) Signed: 06/29/2020 4:50:41 PM By: [name] Signed: 06/29/2020 5:45:00 PM By: [name], [name], RN, CWS, [name], [name] Entered By: [name] on 06/29/2020 15:11:04 MAIMOUNA, RONDEAU ([id number]) -------------------------------------------------------------------------------- Wound Assessment Details Patient Name: MARKELA, WEE. Date of Service: 06/29/2020 2:15 PM Medical Record Number: [id number] Patient Account Number: [id number] Date of Birth/Sex: 1946/06/02 (74 y.o. F) Treating RN: Cornell Barman Primary Care Amahia Madonia: [name] Other Clinician: Referring Kimani Hovis: [name] Treating Madie Cahn/Extender: Tito Dine in Treatment: 81 Wound Status Wound Number: 5 Primary Lymphedema Etiology: Wound Location: Left, Medial Lower Leg Wound Open Wounding Event: Gradually Appeared Status: Date Acquired: 11/19/2018 Comorbid Cataracts, Asthma, Sleep Apnea, Deep Vein Thrombosis, Weeks Of Treatment: 81 History: Hypertension, Peripheral Venous Disease, Osteoarthritis, Clustered Wound: No Received Chemotherapy, Received Radiation Photos Wound  Measurements Length: (cm) 3 Width: (cm) 3 Depth: (cm) 0.1 Area: (cm) 7.069 Volume: (cm) 0.707 % Reduction in Area: 17.3% % Reduction in Volume: 17.3% Epithelialization: None Wound Description Classification: Full Thickness Without Exposed Support Structures Wound Margin: Flat and Intact Exudate Amount: Medium Exudate Type: Serosanguineous Exudate Color: red, brown Foul Odor After Cleansing: No Slough/Fibrino Yes Wound Bed Granulation Amount: Small (1-33%) Granulation Quality: Red Necrotic Amount: Large (67-100%) Necrotic Quality: Adherent Slough Treatment Notes Wound #5 (Left, Medial Lower Leg) Notes Hydrafera blue, abd, 4LL, unna to anchor Electronic Signature(s) Signed: 06/29/2020 4:50:41 PM By: [name] Signed: 06/29/2020 5:45:00 PM By: [name], [name], RN, CWS, [name], [name] Entered By: [name] on 06/29/2020 15:11:37 AVIA, MERKLEY ([id number] Pires, Tenna [name] ([id number]) -------------------------------------------------------------------------------- Vitals Details Patient Name: SULTANA, TIERNEY. Date of Service: 06/29/2020 2:15 PM Medical Record Number: [id number] Patient Account Number: [id number] Date of Birth/Sex: 07-20-1946 (74 y.o. F) Treating RN: Cornell Barman Primary Care Fed Ceci: [name] Other Clinician: Referring Helmi Hechavarria: [name] Treating Nicole Hafley/Extender: Tito Dine in Treatment: 81 Vital Signs Time Taken: 02:45 Temperature (F): 98.4 Height (in): 63 Pulse (bpm): 65 Weight (lbs): 224.7 Respiratory Rate (breaths/min): 20 Body Mass Index (BMI): 39.8 Blood Pressure (mmHg): 151/73 Reference Range: 80 - 120 mg / dl Electronic Signature(s) Signed: 06/29/2020 4:50:41 PM By: [name] Entered By: [name] on 06/29/2020 15:06:11

## 2020-06-30 NOTE — Progress Notes (Signed)
ZORANA, BROCKWELL ([id number]) Visit Report for 06/29/2020 HPI Details Patient Name: [name], [name]. Date of Service: 06/29/2020 2:15 PM Medical Record Number: [id number] Patient Account Number: [id number] Date of Birth/Sex: [birth year] (74 y.o. F) Treating RN: Cornell Barman Primary Care Provider: [name] Other Clinician: Referring Provider: [name] Treating Provider/Extender: Tito Dine in Treatment: 36 History of Present Illness HPI Description: Pleasant 74 year old with history of chronic venous insufficiency. No diabetes or peripheral vascular disease. Left ABI 1.29. Questionable history of left lower extremity DVT. She developed a recurrent ulceration on her left lateral calf in December 2015, which she attributes to poor diet and subsequent lower extremity edema. She underwent endovenous laser ablation of her left greater saphenous vein in 2010. She underwent laser ablation of accessory branch of left GSV in April 2016 by Dr. Kellie Simmering at [facility]. She was previously wearing Unna boots, which she tolerated well. Tolerating 2 layer compression and cadexomer iodine. She returns to clinic for follow-up and is without new complaints. She denies any significant pain at this time. She reports persistent pain with pressure. No claudication or ischemic rest pain. No fever or chills. No drainage. READMISSION 11/13/16; this is a 74 year old woman who is not a diabetic. She is here for a review of a painful area on her left medial lower extremity. I note that she was seen here previously last year for wound I believe to be in the same area. At that time she had undergone previously a left greater saphenous vein ablation by Dr. Kellie Simmering and she had a ablation of the anterior accessory branch of the left greater saphenous vein in March 2016. Seeing that the wound actually closed over. In reviewing the history with her today the ulcer in this area has been recurrent. She describes  a biopsy of this area in 2009 that only showed stasis physiology. 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 Do not see that she has ever had arterial studies in the left leg. She tells me she has a follow-up with Dr. Kellie Simmering in roughly 10 days In any case she developed the reopening of this area roughly a month ago. On the background of this she describes rapidly increasing edema which has responded to Lasix 40 mg and metolazone 2.5 mg as well as the patient's lymph massage. She has been told she has both venous insufficiency and lymphedema but she cannot tolerate compression stockings 11/28/16; the patient saw Dr. Kellie Simmering recently. Per the patient he did arterial Dopplers in the office that did not show evidence of arterial insufficiency, per the patient he stated "treat this like an ordinary venous ulcer". She also saw her dermatologist Dr. Ronnald Ramp who felt that this was more of a vascular ulcer. In general things are improving although she arrives today with increasing bilateral lower extremity edema with weeping a deeper fluid through the wound on the left medial leg compatible with some degree of lymphedema 12/04/16; the patient's wound is fully epithelialized but I don't think fully healed. We will do another week of depression with Promogran and TCA however I suspect we'll be able to discharge her next week. This is a very unusual-looking wound which was initially a figure-of-eight type wound lying on its side surrounded by petechial like hemorrhage. She  has had venous ablation on this side. She apparently does not have an arterial issue per Dr. Kellie Simmering. She saw her dermatologist thought it was "vascular". Patient is definitely going to need ongoing compression and I talked about  this with her today she will go to elastic therapy after she leaves here next week 12/11/16; the patient's wound is not completely closed today. She has surrounding scar tissue and in further discussion with the patient it would appear that she had ulcers in this area in 2009 for a prolonged period of time ultimately requiring a punch biopsy of this area that only showed venous insufficiency. I did not previously pickup on this part of the history from the patient. 12/18/16; the patient's wound is completely epithelialized. There is no open area here. She has significant bilateral venous insufficiency with secondary lymphedema to a mild-to-moderate degree she does not have compression stockings.. She did not say anything to me when I was in the room, she told our intake nurse that she was still having pain in this area. This isn't unusual recurrent small open area. She is going to go to elastic therapy to obtain compression stockings. 12/25/16; the patient's wound is fully epithelialized. There is no open area here. The patient describes some continued episodic discomfort in this area medial left calf. However everything looks fine and healed here. She is been to elastic therapy and caught herself 15-20 mmHg stockings, they apparently were having trouble getting 20-30 mm stockings in her size 01/22/17; this is a patient we discharged from the clinic a month ago. She has a recurrent open wound on her medial left calf. She had 15 mm support stockings. I told her I thought she needed 20-30 mm compression stockings. She tells me that she has been ill with hospitalization secondary to asthma and is been found to have severe hypokalemia likely secondary to a combination of Lasix and metolazone. This morning she noted blistering and leaking fluid on the posterior part of her left leg. She called our intake nurse urgently and we was saw her this afternoon. She has not had any real discomfort here. I don't know  that she's been wearing any stockings on this leg for at least 2-3 days. ABIs in this clinic were 1.21 on the right and 1.3 on the left. She is previously seen vascular surgery who does not think that there is a peripheral arterial issue. 01/30/17; Patient arrives with no open wound on the left leg. She has been to elastic therapy and obtained 20-14mmhg below knee stockings and she has one on the right leg today. READMISSION 02/19/18; this Cirrincione is a now 74 year old patient we've had in this clinic perhaps 3 times before. I had last looked at her from January 07 December 2016 with an area on the medial left leg. We discharged her on 12/25/16 however she had to be readmitted on 01/22/17 with a recurrence. I have in my notes that we discharged her on 20-30 mm stockings although she tells me she was only wearing support hose because she cannot get stockings on predominantly related to her cervical spine surgery/issues. She has had previous ablations done by vein and vascular in [place] including a great saphenous vein ablation on the left with an anterior accessory branch ablation I think both of these were in 2016. On one of the previous visit she had a biopsy noted 2009 that was negative. She is not felt to have an arterial issue. She is not a diabetic. She does  have a history of obstructive sleep apnea hypertension asthma as well as chronic venous insufficiency and lymphedema. On this occasion she noted 2 dry scaly patch on her left leg. She tried to put lotion on this it didn't really help. There were 2 open areas.the JAMERIA, BRADWAY ([id number]) patient has been seeing her primary physician from 02/05/18 through 02/14/18. She had Unna boots applied. The superior wound now on the lateral left leg has closed but she's had one wound that remains open on the lateral left leg. This is not the same spot as we dealt with in 2018. ABIs in this clinic were 1.3 bilaterally 02/26/18; patient has a small wound on  the left lateral calf. Dimensions are down. She has chronic venous insufficiency and lymphedema. 03/05/18; small open area on the left lateral calf. Dimensions are down. Tightly adherent necrotic debris over the surface of the wound which was difficult to remove. Also the dressing [over collagen] stuck to the wound surface. This was removed with some difficulty as well. Change the primary dressing to Hydrofera Blue ready 03/12/18; small open area on the left lateral calf. Comes in with tightly adherent surface eschar as well as some adherent Hydrofera Blue. 03/19/18; open area on the left lateral calf. Again adherent surface eschar as well as some adherent Hydrofera Blue nonviable subcutaneous tissue. She complained of pain all week even with the reduction from 4-3 layer compression I put on last week. Also she had an increase in her ankle and calf measurements probably related to the same thing. 03/26/18; open area on the left lateral calf. A very small open area remains here. We used silver alginate starting last week as the Hydrofera Blue seem to stick to the wound bed. In using 4-layer compression 04/02/18; the open area in the left lateral calf at some adherent slough which I removed there is no open area here. We are able to transition her into her own compression stocking. Truthfully I think this is probably his support hose. However this does not maintain skin integrity will be limited. She cannot put over the toe compression stockings on because of neck problems hand problems etc. She is allergic to the lining layer of juxta lites. We might be forced to use extremitease stocking should this fail READMIT 11/24/2018 Patient is now a 74 year old woman who is not a diabetic. She has been in this clinic on at least 3 previous occasions largely with recurrent wounds on her left leg secondary to chronic venous insufficiency with secondary lymphedema. Her situation is complicated by inability to  get stockings on and an allergy to neoprene which is apparently a component and at least juxta lites and other stockings. As a result she really has not been wearing any stockings on her legs. She tells [place] that roughly 2 or 3 weeks ago she started noticing a stinging sensation just above her ankle on the left medial aspect. She has been diagnosed with pseudogout and she wondered whether this was what she was experiencing. She tried to dress this with something she bought at the store however subsequently it pulled skin off and now she has an open wound that is not improving. She has been using Vaseline gauze with a cover bandage. She saw her primary doctor last week who put an Haematologist on her. ABIs in this clinic was 1.03 on the left 2/12; the area is on the left medial ankle. Odd-looking wound with what looks to be surface epithelialization but a multitude of  small petechial openings. This clearly not closed yet. We have been using silver alginate under 3 layer compression with TCA 2/19; the wound area did not look quite as good this week. Necrotic debris over the majority of the wound surface which required debridement. She continues to have a multitude of what looked to be small petechial openings. She reminds [place] that she had a biopsy on this initially during her first outbreak in 2015 in [place] [facility]. She expresses concern about this being a possible melanoma. 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 I assured her that this does not look anything like melanoma. Except for the petechial reaction it does look like a venous insufficiency area and she certainly has evidence of this on both sides 2/26; a difficult area on the left medial ankle. The patient clearly has chronic venous hypertension with some degree of lymphedema. The odd thing about the area is the small petechial hemorrhages. I am not  really sure how to explain this. This was present last time and this is not a compression injury. We have been using Hydrofera Blue which I changed to last week 3/4; still using Hydrofera Blue. Aggressive debridement today. She does not have known arterial issues. She has seen Dr. Kellie Simmering at [facility] vein [facility] vascular and and has an ablation on the left. [Anterior accessory branch of the greater saphenous]. From what I remember they did not feel she had an arterial issue. The patient has had this area biopsied in 2009 at [facility] dermatology and by her recollection they said this was "stasis". She is also follow-up with dermatology locally who thought that this was more of a vascular issue 3/11; using Hydrofera Blue. Aggressive debridement today. She does not have an arterial issue. We are using 3 layer compression although we may need to go to 4. The patient has been in for multiple changes to her wrap since I last saw her a week ago. She says that the area was leaking. I do not have too much more information on what was found 01/19/19 on evaluation today patient was actually being seen for a nurse visit when unfortunately she had the area on her left lateral lower extremity as well as weeping from the right lower extremity that became apparent. Therefore we did end up actually seeing her for a full visit with myself. She is having some pain at this site as well but fortunately nothing too significant at this point. No fevers, chills, nausea, or vomiting noted at this time. 3/18-Patient is back to the clinic with the left leg venous leg ulcer, the ulcer is larger in size, has a surface that is densely adherent with fibrinous tissue, the Hydrofera Blue was used but is densely adherent and there was difficulty in removing it. The right lower extremity was also wrapped for weeping edema. Patient has a new area over the left lateral foot above the malleolus that is small and appears to have no debris  with intact surrounding skin. Patient is on increased dose of Lasix also as a means to edema management 3/25; the patient has a nonhealing venous ulcer on the medial left leg and last week developed a smaller area on the lateral left calf. We have been using Hydrofera Blue with a contact layer. 4/1; no major change in these wounds areas. Left medial and more recently left lateral calf. I tried Iodoflex last week to aid in debridement  she did not tolerate this. She stated her pain was terrible all week. She took the top layer of the 4 layer compression off. 4/8; the patient actually looks somewhat better in terms of her more prominent left lateral calf wound. There is some healthy looking tissue here. She is still complaining of a lot of discomfort. 4/15; patient in a lot of pain secondary to sciatica. She is on a prednisone taper prescribed by her primary physician. She has the 2 areas one on the left medial and more recently a smaller area on the left lateral calf. Both of these just above the malleoli 4/22; her back pain is better but she still states she is very uncomfortable and now feels she is intolerant to the [employer]. No real change in the wounds we have been using Sorbact. She has been previously intolerant to Iodoflex. There is not a lot of option about what we can use to debride this wound under compression that she no doubt needs. sHe states Ultram no longer works for her pain 4/29; no major change in the wounds slightly increased depth. Surface on the original medial wound perhaps somewhat improved however the more recent area on the lateral left ankle is 100% covered in very adherent debris we have been using Sorbact. She tolerates 4 layer compression well and her edema control is a lot better. She has not had to come in for a nurse check 5/6; no major change in the condition of the wounds. She did consent to debridement today which was done with some difficulty. Continuing Sorbact.  She did not tolerate Iodoflex. She was in for a check of her compression the day after we wrapped her last week this was adjusted but nothing much was found 5/13; no major change in the condition or area of the wounds. I was able to get a fairly aggressive debridement done on the lateral left leg wound. Even using Sorbact under compression. She came back in on Friday to have the wrap changed. She says she felt uncomfortable on the WAYLYNN, BENEFIEL. ([id number]) lateral aspect of her ankle. She has a long history of chronic venous insufficiency including previous ablation surgery on this side. 5/20-Patient returns for wounds on left leg with both wounds covered in slough, with the lateral leg wound larger in size, she has been in 3 layer compression and felt more comfortable, she describes pain in ankle, in leg and pins and needles in foot, and is about to try Pamelor for this 6/3; wounds on the left lateral and left medial leg. The area medially which is the most recent of the 2 seems to have had the largest increase in dimensions. We have been using Sorbac to try and debride the surface. She has been to see orthopedics they apparently did a plain x-ray that was indeterminant. Diagnosed her with neuropathy and they have ordered an MRI to determine if there is underlying osteomyelitis. This was not high on my thought list but I suppose it is prudent. We have advised her to make an appointment with vein and vascular in [place]. She has a history of a left greater saphenous and accessory vein ablations I wonder if there is anything else that can be done from a surgical point of view to help in these difficult refractory wounds. We have previously healed this wound on one occasion but it keeps on reopening [medial side] 6/10; deep tissue culture I did last week I think on the left medial wound showed  both moderate E. coli and moderate staph aureus [MSSA]. She is going to require antibiotics and I have  chosen Augmentin. We have been using Sorbact and we have made better looking wound surface on both sides but certainly no improvement in wound area. She was back in last Friday apparently for a dressing changes the wrap was hurting her outer left ankle. She has not managed to get a hold of vein and vascular in [place]. We are going to have to make her that appointment 6/17; patient is tolerating the Augmentin. She had an MRI that I think was ordered by orthopedic surgeon this did not show osteomyelitis or an abscess did suggest cellulitis. We have been using Sorbact to the lateral and medial ankles. We have been trying to arrange a follow-up appointment with vein and vascular in [place] or did her original ablations. We apparently an area sent the request to vein and vascular in [facility] 6/24; patient has completed the Augmentin. We do not yet have a vein and vascular appointment in [place]. I am not sure what the issue is here we have asked her to call tomorrow. We are using Sorbact. Making some improvements and especially the medial wound. Both surfaces however look better medial and lateral. 7/1; the patient has been in contact with vein and vascular in [place] but has not yet received an appointment. Using Sorbact we have gradually improve the wound surface with no improvement in surface area. She is approved for Apligraf but the wound surface still is not completely viable. She has not had to come in for a dressing change 7/8; the patient has an appointment with vein and vascular on 7/31 which is a Friday afternoon. She is concerned about getting back here for [place] to dress her wounds. I think it is important to have them goal for her venous reflux/history of ablations etc. to see if anything else can be done. She apparently tested positive for 1 of the blood tests with regards to lupus and saw a rheumatologist. He has raised the issue of vasculitis again. I have had this thought in  the past however the evidence seems overwhelming that this is a venous reflux etiology. If the rheumatologist tells me there is clinical and laboratory investigation is positive for lupus I will rethink this. 7/15; the patient's wound surfaces are quite a bit better. The medial area which was her original wound now has no depth although the lateral wound which was the more recent area actually appears larger. Both with viable surfaces which is indeed better. Using Sorbact. I wanted to use Apligraf on her however there is the issue of the vein and vascular appointment on 7/31 at 2:00 in the afternoon which would not allow her to get back to be rewrapped and they would no doubt remove the graft 7/22; the patient's wound surfaces have moderate amount of debris although generally look better. The lateral one is larger with 2 small satellite areas superiorly. We are waiting for her vein and vascular appointment on 7/31. She has been approved for Apligraf which I would like to use after th 7/29; wound surfaces have improved no debridement is required we have been using Sorbact. She sees vein and vascular on Friday with this so question of whether anything can be done to lessen the likelihood of recurrence and/or speed the healing of these areas. She is already had previous ablations. She no doubt has severe venous hypertension 8/5-Patient returns at 1 week, she was in [employer]  for 3 days by her podiatrist, we have been using so backed to the wound, she has increased pain in both the wounds on the left lower leg especially the more distal one on the lateral aspect 8/12-Patient returns at 1 week and she is agreeable to having debridement in both wounds on her left leg today. We have been using Sorbact, and vascular studies were reviewed at last visit 8/19; the patient arrives with her wounds fairly clean and no debridement is required. We have used Sorbact which is really done a nice job in cleaning up  these very difficult wound surfaces. The patient saw Dr. Donzetta Matters of vascular surgery on 7/31. He did not feel that there was an arterial component. He felt that her treated greater saphenous vein is adequately addressed and that the small saphenous vein did not appear to be involved significantly. She was also noted to have deep venous reflux which is not treatable. Dr. Donzetta Matters mentioned the possibility of a central obstructive component leading to reflux and he offered her central venography. She wanted to discuss this or think about it. I have urged her to go ahead with this. She has had recurrent difficult wounds in these areas which do heal but after months in the clinic. If there is anything that can be done to reduce the likelihood of this I think it is worth it. 9/2 she is still working towards getting follow-up with Dr. Donzetta Matters to schedule her CT. Things are quite a bit worse venography. I put Apligraf on 2 weeks ago on both wounds on the medial and lateral part of her left lower leg. She arrives in clinic today with 3 superficial additional wounds above the area laterally and one below the wound medially. She describes a lot of discomfort. I think these are probably wrapped injuries. Does not look like she has cellulitis. 07/20/2019 on evaluation today patient appears to be doing somewhat poorly in regard to her lower extremity ulcers. She in fact showed signs of erythema in fact we may even be dealing with an infection at this time. Unfortunately I am unsure if this is just infection or if indeed there may be some allergic reaction that occurred as a result of the Apligraf application. With that being said that would be unusual but nonetheless not impossible in this patient is one who is unfortunately allergic to quite a bit. Currently we have been using the Sorbact which seems to do as well as anything for her. I do think we may want to obtain a culture today to see if there is anything showing up  there that may need to be addressed. 9/16; noted that last week the wounds look worse in 1 week follow-up of the Apligraf. Using Sorbact as of 2 days ago. She arrives with copious amounts of drainage and new skin breakdown on the back of the left calf. The wounds arm more substantial bilaterally. There is a fair amount of swelling in the left calf no overt DVT there is edema present I think in the left greater than right thigh. She is supposed to go on 9/28 for CT venography. The wounds on the medial and lateral calf are worse and she has new skin breakdown posteriorly at least new for me. This is almost developing into a circumferential wound area The Apligraf was taken off last week which I agree with things are not going in the right direction a culture was done we do not have that back yet. She is  on Augmentin that she started 2 days ago 9/23; dressing was changed by her nurses on Monday. In general there is no improvement in the wound areas although the area looks less angry than last week. She did get Augmentin for MSSA cultured on the 14th. She still appears to have too much swelling in the left leg even with 3 layer compression 9/30; the patient underwent her procedure on 9/28 by Dr. Donzetta Matters at [facility]. She was discovered to have the common iliac vein measuring 12.2 mm but at the level of L4-L5 measured 3 mm. After stenting it measured 10 mm. 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 Rouleaux flow in the common femoral and femoral vein was observed much improved after stenting. We are using silver alginate to the wounds on the medial and lateral ankle on the left. 4 layer compression 10/7; the patient had fluid swelling around her knee and 4 layer compression. At the advice of vein and vascular this was reduced to 3 layer which she is tolerating better. We have been using silver alginate under 3 layer compression since last Friday 10/14; arrives with the  areas on the left ankle looking a lot better. Inflammation in the area also a lot better. She came in for a nurse check on SURA, CANUL ([id number]) 10/9 10/21; continued nice improvement. Slight improvements in surface area of both the medial and lateral wounds on the left. A lot of the satellite lesions in the weeping erythema around these from stasis dermatitis is resolved. We have been using silver alginate 10/28; general improvement in the entire wound areas although not a lot of change in dimensions the wound certainly looks better. There is a lot less in terms of venous inflammation. Continue silver alginate this week however look towards Hydrofera Blue next week 11/4; very adherent debris on the medial wound left wound is not as bad. We have been using silver alginate. Change to [facility] today 11/11; very adherent debris on both wound areas. She went to vein and vascular last week and follow-up they put in [place] boot on this today. He says the [facility] was adherent. Wound is definitely not as good as last week. Especially on the left there the satellite lesions look more prominent 11/18; absolutely no better. erythema on lateral aspect with tenderness. 09/30/2019 on evaluation today patient appears to actually be doing better. Dr. Dellia Nims did put her on doxycycline last week which I do believe has helped her at this point. Fortunately there is no signs of active infection at this time. No fevers, chills, nausea, vomiting, or diarrhea. I do believe he may want extend the doxycycline for 7 additional days just to ensure everything does completely cleared up the patient is in agreement with that plan. Otherwise she is going require some sharp debridement today 12/2; patient is completing a 2-week course of doxycycline. I gave her this empirically for inflammation as well as infection when I last saw her 2 weeks ago. All of this seems to be better. She is using silver alginate she  has the area on the medial aspect of the larger area laterally and the 2 small satellite regions laterally above the major wound. 12/9; the patient's wound on the left medial and left lateral calf look really quite good. We have been using silver alginate. She saw vein and vascular in follow-up on 10/09/2019. She has had a previous left greater saphenous vein ablation by Dr. Oscar La in 2016.  More recently she underwent a left common iliac vein stent by Dr. 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 The swelling is improved and certainly the wounds have improved. The patient shows [place] today area on the right medial calf there is almost no wound but leaking lymphedema. She says she start this started 3 or 4 days ago. She did not traumatize it. It is not painful. She does not wear compression on that side 12/16; the patient continues to do well laterally. Medially still requiring debridement. The area on the right calf did not materialize to anything and is not currently open. We wrapped this last time. She has support stockings for that leg although I am not sure they are going to provide adequate compression 12/23; the lateral wound looks stable. Medially still requiring debridement for tightly adherent fibrinous debris. We've been using silver alginate. Surface area not any different 12/30; neither wound is any better with regards to surface and the area on the left lateral is larger. I been using silver alginate to the left lateral which look quite good last week and Sorbact to the left medial 11/11/2019. Lateral wound area actually looks better and somewhat smaller. Medial still requires a very aggressive debridement today. We have been using Sorbact on both wound areas 1/13; not much better still adherent debris bilaterally. I been using Sorbact. She has severe venous hypertension. Probably some degree of dermal fibrosis distally. I wonder whether tighter compression might help and I am going  to try that today. We also need to work on the bioburden 1/20; using Sorbact. She has severe venous hypertension status post stent placement for pelvic vein compression. We applied gentamicin last time to see if we could reduce bioburden I had some discussion with her today about the use of pentoxifylline. This is occasionally used in this setting for wounds with refractory venous insufficiency. However this interacts with Plavix. She tells me that she was put on this after stent placement for 3 months. She will call Dr. Claretha Cooper office to discuss 1/27; we are using gentamicin under Sorbact. 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 She has a stent. Wound medially is measuring smaller this week. Laterally measuring slightly larger although she has some satellite lesions superiorly 2/3; gentamicin under Sorbact under 4-layer compression. 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 She has a stent on Plavix. Her wounds are measuring smaller this week. More substantially laterally where there is a satellite lesion superiorly. 2/10; gentamicin under Sorbac. 4-layer compression. Patient communicated with Dr. Donzetta Matters at vein and vascular in [place]. He is okay with the patient coming off Plavix I will therefore start her on pentoxifylline for a 1 month trial. In general her wounds look better today. I had some concerns about swelling in the left thigh however she measures 61.5 on the right and 63 on the mid thigh which does not suggest there is any difficulty. The patient is not describing any pain. 2/17; gentamicin under Sorbac 4-layer compression. She has been on pentoxifylline for 1 week and complains of loose stool. No nausea she is eating and drinking well 2/24; the patient apparently came in 2 days ago for a nurse visit when her wrap fell down. Both areas look a little worse this week macerated medially and satellite lesions laterally. Change to  silver alginate today 3/3; wounds are larger today especially medially. She also has more swelling in her foot lower leg and I even noted some swelling in  her posterior thigh which is tender. I wonder about the patency of her stent. Fortuitously she sees Dr. Claretha Cooper group on Friday 3/10; Mrs. Mastrogiovanni was seen by vein and vascular on 3/5. The patient underwent ultrasound. There was no evidence of thrombosis involving the IVC no evidence of thrombosis involving the right common iliac vein there is no evidence of thrombosis involving the right external iliac vein the left external vein is also patent. The right common iliac vein stent appears patent bilateral common femoral veins are compressible and appear patent. I was concerned about the left common iliac stent however it looks like this is functional. She has some edema in the posterior thigh that was tender she still has that this week. I also note they had trouble finding the pulses in her left foot and booked her for an ABI baseline in 4 weeks. She will follow up in 6 months for repeat IVC duplex. The patient stopped the pentoxifylline because of diarrhea. It does not look like that was being effective in any case. I have advised her to go back on her aspirin 81 mg tablet, vascular it also suggested this 3/17; comes in today with her wound surfaces a lot better. The excoriations from last week considerably better probably secondary to the TCA. We have been using silver alginate 3/24; comes in today with smaller wounds both medially and laterally. Both required debridement. There are 2 small satellite areas superiorly laterally. She also has a very odd bandlike area in the mid calf almost looking like there was a weakness in the wrap in a localized area. I would write this off as being this however anteriorly she has a small raised ballotable area that is very tender almost reminiscent of an abscess but there was no obvious purulent surface to  it. 02/04/20 upon evaluation today patient appears to be doing fairly well in regard to her wounds today. Fortunately there is no signs of active DEONE, OMAHONEY. ([id number]) infection at this time. No fevers, chills, nausea, vomiting, or diarrhea. She has been tolerating the dressing changes without complication. Fortunately I feel like she is showing signs of improvement although has been sometime since have seen her. Nonetheless the area of concern that Dr. Dellia Nims had last week where she had possibly an area of the wrap that was we can allow the leg to bulge appears to be doing significantly better today there is no signs of anything worsening. 4/7; the patient's wounds on her medial and lateral left leg continue to contract. We have been using a regular alginate. Last week she developed an area on the right medial lower leg which is probably a venous ulcer as well. 4/14; the wounds on her left medial and lateral lower leg continue to contract. Surface eschar. We have been using regular alginate. The area on the right medial lower leg is closed. We have been putting both legs under 4-layer contraction. The patient went back to see vein and vascular she had arterial studies done which were apparently "quite good" per the patient although I have not read their notes I have never felt she had an arterial issue. 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 This is been longstanding and refractory to exercise, leg elevation and longstanding use of compression wraps in our clinic as well as compression stockings on the times we have been able to get these to heal 4/21; we thought she actually might be close this week however she arrives in  clinic with a lot of edema in her upper left calf and into her posterior thigh. This is been an intermittent problem here. She says the wrap fell down but it was replaced with a nurse visit on Monday. We are using calcium  alginate to the wounds and the wound sizes there not terribly larger than last week but there is a lot more edema 4/28; again wound edges are smaller on both sides. Her edema is better controlled than last time. She is obtained her compression pumps from [employer] although they have not been to her home to set these up. 5/5; left medial and left lateral both look stable. I am not sure the medial is any smaller. We have been using calcium alginate under 4-layer compression. oShe had an area on the right medial. This was eschared today. We have been wrapping this as well. She does not tolerate external compression stockings due to a history of various contact allergies. She has her compression pumps however the representative from the company is coming on her to show her how to use these tomorrow 5/19; patient with severe chronic venous insufficiency secondary to central venous disease. She had a stent placed in her left common iliac vein. She has done better since but still difficult to control wounds. She comes in today with nothing open on the right leg. Her areas on the left medial and left lateral are just about closed. We are using calcium alginate under 4-layer compression. She is using her external compression pumps at home She only has 15-20 support stockings. States she cannot get anything tighter than that on. 03/30/20-Patient returns at 1 week, the wounds on the left leg are both slightly bigger, the last week she was on 3 layer compression which started to slide down. She is starting to use her lymphedema pumps although she stated on 1 day her right ankle started to swell up and she have to stop that day. Unfortunately the open area seem to oscillate between improving to the point of healing and then flaring up all to do with effectiveness of compression or lack of due to the left leg topography not keeping the compression wraps from rolling down 6/2 patient comes in with a 15/20  mmHg stocking on the right leg. She tells me that she developed a lot of swelling in her ankles she saw orthopedics she was felt to possibly be having a flare of pseudogout versus some other type of arthritis. She was put on steroids for a respiratory issue so that helps with the inflammation. She has not been using the pumps all week. She thinks the left thigh is more swollen than usual and I would agree with that. She has an appointment with Dr. Donzetta Matters 9 days or so from now 6/9; both wounds on the left medial and left lateral are smaller. We have been using calcium alginate under compression. She does not have an open wound on the right leg she is using a stocking and her compression pumps things are going well. She has an appointment with Dr. Donzetta Matters with regards to her stent in the left common iliac vein 6/16; the wounds on the left medial and left lateral ankle continues to contract. The patient saw Dr. Donzetta Matters and I think he seems satisfied. Ordered follow-up venous reflux studies on both sides in September. Cautioned that she may need thigh-high stockings. She has been using calcium alginate under compression on the left and her own stocking on the right leg.  She tells [place] there are no open wounds on the right 6/23; left lateral is just about closed. Medial required debridement today. We have been using calcium alginate. Extensive discussion about the compression pumps she is only using these on 25 mmHg states she could not take 40 or 30 when the wrap came out to her home to demonstrate these. He said they should not feel tight 6/30; the left lateral wound has a slight amount of eschar. . The area medially is about the same using Hydrofera Blue. 7/7; left lateral wound still has some eschar. I will remove this next week may be closed. The area medially is very small using Hydrofera Blue with improvement. Unfortunately the stockings fell down. Unfortunately the blisters have developed at the edge of  where the wrap fell. When this happened she says her legs hurt she did not use her pumps. We are not open Monday for her to come in and change the wraps and she had an appointment yesterday. She also tells me that she is going to have an MRI of her back. She is having pain radiating into her left anterior leg she thinks her from an L5 disc. She saw Dr. Ellene Route of neurosurgery 7/14; the area on the left lateral ankle area is closed. Still a small area medially however it looks better as well. We have been using Hydrofera Blue under 4-layer compression 7/21; left lateral ankle is still closed however her wound on the medial left calf is actually larger. This is probably because Hydrofera Blue got stuck to the wound. She came in for a nurse change on Friday and will do that again this week I was concerned about the amount of swelling that she had last week however she is using her compression pumps twice a day and the swelling seems well controlled 7/28; remaining wound on the left medial lower leg is smaller. We have been using moistened silver collagen under compression she is coming back for a nurse visit. For reasons that were not really clear she was just keeping her legs elevated and not using her compression pumps. I have asked her to use the compression pumps. She does not have any wounds on the right leg 06/15/20-Patient returns at 2 weeks, her LLE edema is worse and she developed a blister wound that is new and has bigger posterior calf wound on right, we are using Prisma with pad, 4 layer compression. she has been on lasix 40 mg daily 8/18; patient arrives today with things a lot worse than I remember from a few weeks ago. She was seen last week. Noted that her edema was worse and that she now had a left lateral wound as well as deteriorating edema in the medial and posterior part of the lower leg. She says she is using his or her external compression pumps once a day although I wonder about  the compliance. 8/25; weeping area on the right medial lower leg. This had actually gotten a small localized area of her compression stocking wet. oOn the left side there is a large denuded area on the posterior medial lower leg and smaller area on the lateral. This was not the original areas that we dealt with. Electronic Signature(s) Signed: 06/30/2020 4:51:35 PM By: Linton Ham MD Entered By: Linton Ham on 06/29/2020 16:11:45 AVERIANNA, BRUGGER ([id number], FRANE ([id number]) -------------------------------------------------------------------------------- Physical Exam Details Patient Name: NYSSA, SAYEGH. Date of Service: 06/29/2020 2:15 PM Medical Record Number: [id number] Patient Account Number: [id number]  Date of Birth/Sex: 1946-01-06 (74 y.o. F) Treating RN: Cornell Barman Primary Care Provider: [name] Other Clinician: Referring Provider: [name] Treating Provider/Extender: Tito Dine in Treatment: 25 Constitutional Patient is hypertensive.. Pulse regular and within target range for patient.Marland Kitchen Respirations regular, non-labored and within target range.. Temperature is normal and within the target range for the patient.Marland Kitchen appears in no distress. Cardiovascular Pedal pulses are palpable. The edema control in her legs is not horrible. She does indeed have a weeping area on the medial part of the right lower leg this looks like lymphedema fluid.. Notes Wound exam oNew this week on the right medial calf a small draining area which is pinpoint. This looks like lymphedema fluid. oOn the left medial lower leg a reasonably large wound on the posterior medial part of her calf. This is tender but not acutely infected. Laterally the area is also is more superior but looks as though it is largely epithelialized. Electronic Signature(s) Signed: 06/30/2020 4:51:35 PM By: Linton Ham MD Entered By: Linton Ham on 06/29/2020 16:16:30 Service,  [name] ([id number]) -------------------------------------------------------------------------------- Physician Orders Details Patient Name: EVADENE, WARDRIP. Date of Service: 06/29/2020 2:15 PM Medical Record Number: [id number] Patient Account Number: [id number] Date of Birth/Sex: 12-Nov-1945 (74 y.o. F) Treating RN: Cornell Barman Primary Care Provider: [name] Other Clinician: Referring Provider: [name] Treating Provider/Extender: Tito Dine in Treatment: 10 Verbal / Phone Orders: No Diagnosis Coding Anesthetic (add to Medication List) Wound #12 Left,Lateral Lower Leg o Topical Lidocaine 4% cream applied to wound bed prior to debridement (In Clinic Only). Wound #5 Left,Medial Lower Leg o Topical Lidocaine 4% cream applied to wound bed prior to debridement (In Clinic Only). Primary Wound Dressing Wound #12 Left,Lateral Lower Leg o Hydrafera Blue Ready Transfer Wound #5 Left,Medial Lower Leg o Hydrafera Blue Ready Transfer Secondary Dressing Wound #12 Left,Lateral Lower Leg o ABD pad Wound #5 Left,Medial Lower Leg o ABD pad Dressing Change Frequency Wound #12 Left,Lateral Lower Leg o Change dressing every week o Other: - Nurse visit Friday and Monday if needed. Wound #5 Left,Medial Lower Leg o Change dressing every week o Other: - Nurse visit Friday and Monday if needed. Follow-up Appointments Wound #12 Left,Lateral Lower Leg o Return Appointment in 1 week. o Nurse Visit as needed Wound #5 Left,Medial Lower Leg o Return Appointment in 1 week. o Nurse Visit as needed Edema Control Wound #12 Left,Lateral Lower Leg o 4 Layer Compression System - Bilateral o Compression Pump: Use compression pump on left lower extremity for 60 minutes, twice daily. o Compression Pump: Use compression pump on right lower extremity for 60 minutes, twice daily. Wound #5 Left,Medial Lower Leg o 4 Layer Compression System -  Bilateral o Compression Pump: Use compression pump on left lower extremity for 60 minutes, twice daily. o Compression Pump: Use compression pump on right lower extremity for 60 minutes, twice daily. Electronic Signature(s) Signed: 06/29/2020 5:45:00 PM By: [name], [name], RN, CWS, [name], [name] Signed: 06/30/2020 4:51:35 PM By: Linton Ham MD TIMIYAH, ROMITO ([id number]) Entered By: [name] [name], RN, CWS, Kim on 06/29/2020 15:38:08 TRENITA, HULME ([id number]) -------------------------------------------------------------------------------- Problem List Details Patient Name: TOMOKO, SANDRA. Date of Service: 06/29/2020 2:15 PM Medical Record Number: [id number] Patient Account Number: [id number] Date of Birth/Sex: 01/13/1946 (74 y.o. F) Treating RN: Cornell Barman Primary Care Provider: [name] Other Clinician: Referring Provider: [name] Treating Provider/Extender: Tito Dine in Treatment: 69 Active Problems ICD-10 Encounter Code Description Active Date MDM  Diagnosis L97.221 Non-pressure chronic ulcer of left calf limited to breakdown of skin 01/07/2019 No Yes I89.0 Lymphedema, not elsewhere classified 12/10/2018 No Yes I87.332 Chronic venous hypertension (idiopathic) with ulcer and inflammation of 12/09/2019 No Yes left lower extremity I83.009 Varicose veins of unspecified lower extremity with ulcer of unspecified 04/13/2020 No Yes site I82.592 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 12/09/2019 No Yes lower extremity L97.818 Non-pressure chronic ulcer of other part of right lower leg with other 10/14/2019 No Yes specified severity Inactive Problems Resolved Problems ICD-10 Code Description Active Date Resolved Date L97.211 Non-pressure chronic ulcer of right calf limited to breakdown of skin 02/10/2020 02/10/2020 Electronic Signature(s) Signed: 06/30/2020 4:51:35 PM By: Linton Ham MD Entered By: Linton Ham on 06/29/2020  16:19:00 Birden, [name] ([id number]) -------------------------------------------------------------------------------- Progress Note Details Patient Name: GWEN, SARVIS. Date of Service: 06/29/2020 2:15 PM Medical Record Number: [id number] Patient Account Number: [id number] Date of Birth/Sex: January 25, 1946 (73 y.o. F) Treating RN: Cornell Barman Primary Care Provider: [name] Other Clinician: Referring Provider: [name] Treating Provider/Extender: Tito Dine in Treatment: 64 Subjective History of Present Illness (HPI) Pleasant 74 year old with history of chronic venous insufficiency. No diabetes or peripheral vascular disease. Left ABI 1.29. Questionable history of left lower extremity DVT. She developed a recurrent ulceration on her left lateral calf in December 2015, which she attributes to poor diet and subsequent lower extremity edema. She underwent endovenous laser ablation of her left greater saphenous vein in 2010. She underwent laser ablation of accessory branch of left GSV in April 2016 by Dr. Kellie Simmering at [facility]. She was previously wearing Unna boots, which she tolerated well. Tolerating 2 layer compression and cadexomer iodine. She returns to clinic for follow-up and is without new complaints. She denies any significant pain at this time. She reports persistent pain with pressure. No claudication or ischemic rest pain. No fever or chills. No drainage. READMISSION 11/13/16; this is a 74 year old woman who is not a diabetic. She is here for a review of a painful area on her left medial lower extremity. I note that she was seen here previously last year for wound I believe to be in the same area. At that time she had undergone previously a left greater saphenous vein ablation by Dr. Kellie Simmering and she had a ablation of the anterior accessory branch of the left greater saphenous vein in March 2016. Seeing that the wound actually closed over. In reviewing the  history with her today the ulcer in this area has been recurrent. She describes a biopsy of this area in 2009 that only showed stasis physiology. 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 Do not see that she has ever had arterial studies in the left leg. She tells me she has a follow-up with Dr. Kellie Simmering in roughly 10 days In any case she developed the reopening of this area roughly a month ago. On the background of this she describes rapidly increasing edema which has responded to Lasix 40 mg and metolazone 2.5 mg as well as the patient's lymph massage. She has been told she has both venous insufficiency and lymphedema but she cannot tolerate compression stockings 11/28/16; the patient saw Dr. Kellie Simmering recently. Per the patient he did arterial Dopplers in  the office that did not show evidence of arterial insufficiency, per the patient he stated "treat this like an ordinary venous ulcer". She also saw her dermatologist Dr. Ronnald Ramp who felt that this was more of a vascular ulcer. In general things are improving although she arrives today with increasing bilateral lower extremity edema with weeping a deeper fluid through the wound on the left medial leg compatible with some degree of lymphedema 12/04/16; the patient's wound is fully epithelialized but I don't think fully healed. We will do another week of depression with Promogran and TCA however I suspect we'll be able to discharge her next week. This is a very unusual-looking wound which was initially a figure-of-eight type wound lying on its side surrounded by petechial like hemorrhage. She has had venous ablation on this side. She apparently does not have an arterial issue per Dr. Kellie Simmering. She saw her dermatologist thought it was  "vascular". Patient is definitely going to need ongoing compression and I talked about this with her today she will go to elastic therapy after she leaves here next week 12/11/16; the patient's wound is not completely closed today. She has surrounding scar tissue and in further discussion with the patient it would appear that she had ulcers in this area in 2009 for a prolonged period of time ultimately requiring a punch biopsy of this area that only showed venous insufficiency. I did not previously pickup on this part of the history from the patient. 12/18/16; the patient's wound is completely epithelialized. There is no open area here. She has significant bilateral venous insufficiency with secondary lymphedema to a mild-to-moderate degree she does not have compression stockings.. She did not say anything to me when I was in the room, she told our intake nurse that she was still having pain in this area. This isn't unusual recurrent small open area. She is going to go to elastic therapy to obtain compression stockings. 12/25/16; the patient's wound is fully epithelialized. There is no open area here. The patient describes some continued episodic discomfort in this area medial left calf. However everything looks fine and healed here. She is been to elastic therapy and caught herself 15-20 mmHg stockings, they apparently were having trouble getting 20-30 mm stockings in her size 01/22/17; this is a patient we discharged from the clinic a month ago. She has a recurrent open wound on her medial left calf. She had 15 mm support stockings. I told her I thought she needed 20-30 mm compression stockings. She tells me that she has been ill with hospitalization secondary to asthma and is been found to have severe hypokalemia likely secondary to a combination of Lasix and metolazone. This morning she noted blistering and leaking fluid on the posterior part of her left leg. She called our intake nurse urgently and we  was saw her this afternoon. She has not had any real discomfort here. I don't know that she's been wearing any stockings on this leg for at least 2-3 days. ABIs in this clinic were 1.21 on the right and 1.3 on the left. She is previously seen vascular surgery who does not think that there is a peripheral arterial issue. 01/30/17; Patient arrives with no open wound on the left leg. She has been to elastic therapy and obtained 20-46mmhg below knee stockings and she has one on the right leg today. READMISSION 02/19/18; this Samad is a now 74 year old patient we've had in this clinic perhaps 3 times before. I had last looked at her  from January 07 December 2016 with an area on the medial left leg. We discharged her on 12/25/16 however she had to be readmitted on 01/22/17 with a recurrence. I have in my notes that we discharged her on 20-30 mm stockings although she tells me she was only wearing support hose because she cannot get stockings on predominantly related to her cervical spine surgery/issues. She has had previous ablations done by vein and vascular in [place] including a great saphenous vein ablation on the left with an anterior accessory branch ablation I think both of these were in 2016. On one of the previous visit she had a biopsy noted 2009 that was negative. She is not felt to have an arterial issue. She is not a diabetic. She does have a history of obstructive sleep apnea hypertension asthma as well as chronic venous insufficiency and lymphedema. On this occasion she noted 2 dry scaly patch on her left leg. She tried to put lotion on this it didn't really help. There were 2 open areas.the patient has been seeing her primary physician from 02/05/18 through 02/14/18. She had Unna boots applied. The superior wound now on the lateral left leg has closed but she's had one wound that remains open on the lateral left leg. This is not the same spot as we dealt with in 2018. ABIs in this clinic were  1.3 bilaterally ALEXIANA, LAVERDURE ([id number]) 02/26/18; patient has a small wound on the left lateral calf. Dimensions are down. She has chronic venous insufficiency and lymphedema. 03/05/18; small open area on the left lateral calf. Dimensions are down. Tightly adherent necrotic debris over the surface of the wound which was difficult to remove. Also the dressing [over collagen] stuck to the wound surface. This was removed with some difficulty as well. Change the primary dressing to Hydrofera Blue ready 03/12/18; small open area on the left lateral calf. Comes in with tightly adherent surface eschar as well as some adherent Hydrofera Blue. 03/19/18; open area on the left lateral calf. Again adherent surface eschar as well as some adherent Hydrofera Blue nonviable subcutaneous tissue. She complained of pain all week even with the reduction from 4-3 layer compression I put on last week. Also she had an increase in her ankle and calf measurements probably related to the same thing. 03/26/18; open area on the left lateral calf. A very small open area remains here. We used silver alginate starting last week as the Hydrofera Blue seem to stick to the wound bed. In using 4-layer compression 04/02/18; the open area in the left lateral calf at some adherent slough which I removed there is no open area here. We are able to transition her into her own compression stocking. Truthfully I think this is probably his support hose. However this does not maintain skin integrity will be limited. She cannot put over the toe compression stockings on because of neck problems hand problems etc. She is allergic to the lining layer of juxta lites. We might be forced to use extremitease stocking should this fail READMIT 11/24/2018 Patient is now a 74 year old woman who is not a diabetic. She has been in this clinic on at least 3 previous occasions largely with recurrent wounds on her left leg secondary to chronic venous  insufficiency with secondary lymphedema. Her situation is complicated by inability to get stockings on and an allergy to neoprene which is apparently a component and at least juxta lites and other stockings. As a result she really has not been  wearing any stockings on her legs. She tells [place] that roughly 2 or 3 weeks ago she started noticing a stinging sensation just above her ankle on the left medial aspect. She has been diagnosed with pseudogout and she wondered whether this was what she was experiencing. She tried to dress this with something she bought at the store however subsequently it pulled skin off and now she has an open wound that is not improving. She has been using Vaseline gauze with a cover bandage. She saw her primary doctor last week who put an Haematologist on her. ABIs in this clinic was 1.03 on the left 2/12; the area is on the left medial ankle. Odd-looking wound with what looks to be surface epithelialization but a multitude of small petechial openings. This clearly not closed yet. We have been using silver alginate under 3 layer compression with TCA 2/19; the wound area did not look quite as good this week. Necrotic debris over the majority of the wound surface which required debridement. She continues to have a multitude of what looked to be small petechial openings. She reminds [place] that she had a biopsy on this initially during her first outbreak in 2015 in [place] [facility]. She expresses concern about this being a possible melanoma. 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 I assured her that this does not look anything like melanoma. Except for the petechial reaction it does look like a venous insufficiency area and she certainly has evidence of this on both sides 2/26; a difficult area on the left medial ankle. The patient clearly has chronic venous hypertension with some degree of  lymphedema. The odd thing about the area is the small petechial hemorrhages. I am not really sure how to explain this. This was present last time and this is not a compression injury. We have been using Hydrofera Blue which I changed to last week 3/4; still using Hydrofera Blue. Aggressive debridement today. She does not have known arterial issues. She has seen Dr. Kellie Simmering at [facility] vein [facility] vascular and and has an ablation on the left. [Anterior accessory branch of the greater saphenous]. From what I remember they did not feel she had an arterial issue. The patient has had this area biopsied in 2009 at [facility] dermatology and by her recollection they said this was "stasis". She is also follow-up with dermatology locally who thought that this was more of a vascular issue 3/11; using Hydrofera Blue. Aggressive debridement today. She does not have an arterial issue. We are using 3 layer compression although we may need to go to 4. The patient has been in for multiple changes to her wrap since I last saw her a week ago. She says that the area was leaking. I do not have too much more information on what was found 01/19/19 on evaluation today patient was actually being seen for a nurse visit when unfortunately she had the area on her left lateral lower extremity as well as weeping from the right lower extremity that became apparent. Therefore we did end up actually seeing her for a full visit with myself. She is having some pain at this site as well but fortunately nothing too significant at this point. No fevers, chills, nausea, or vomiting noted at this time. 3/18-Patient is back to the clinic with the left leg venous leg ulcer, the ulcer is larger in size, has a surface that is densely  adherent with fibrinous tissue, the Hydrofera Blue was used but is densely adherent and there was difficulty in removing it. The right lower extremity was also wrapped for weeping edema. Patient has a new area over  the left lateral foot above the malleolus that is small and appears to have no debris with intact surrounding skin. Patient is on increased dose of Lasix also as a means to edema management 3/25; the patient has a nonhealing venous ulcer on the medial left leg and last week developed a smaller area on the lateral left calf. We have been using Hydrofera Blue with a contact layer. 4/1; no major change in these wounds areas. Left medial and more recently left lateral calf. I tried Iodoflex last week to aid in debridement she did not tolerate this. She stated her pain was terrible all week. She took the top layer of the 4 layer compression off. 4/8; the patient actually looks somewhat better in terms of her more prominent left lateral calf wound. There is some healthy looking tissue here. She is still complaining of a lot of discomfort. 4/15; patient in a lot of pain secondary to sciatica. She is on a prednisone taper prescribed by her primary physician. She has the 2 areas one on the left medial and more recently a smaller area on the left lateral calf. Both of these just above the malleoli 4/22; her back pain is better but she still states she is very uncomfortable and now feels she is intolerant to the [employer]. No real change in the wounds we have been using Sorbact. She has been previously intolerant to Iodoflex. There is not a lot of option about what we can use to debride this wound under compression that she no doubt needs. sHe states Ultram no longer works for her pain 4/29; no major change in the wounds slightly increased depth. Surface on the original medial wound perhaps somewhat improved however the more recent area on the lateral left ankle is 100% covered in very adherent debris we have been using Sorbact. She tolerates 4 layer compression well and her edema control is a lot better. She has not had to come in for a nurse check 5/6; no major change in the condition of the wounds. She did  consent to debridement today which was done with some difficulty. Continuing Sorbact. She did not tolerate Iodoflex. She was in for a check of her compression the day after we wrapped her last week this was adjusted but nothing much was found 5/13; no major change in the condition or area of the wounds. I was able to get a fairly aggressive debridement done on the lateral left leg wound. Even using Sorbact under compression. She came back in on Friday to have the wrap changed. She says she felt uncomfortable on the lateral aspect of her ankle. She has a long history of chronic venous insufficiency including previous ablation surgery on this side. 5/20-Patient returns for wounds on left leg with both wounds covered in slough, with the lateral leg wound larger in size, she has been in 3 layer compression and felt more comfortable, she describes pain in ankle, in leg and pins and needles in foot, and is about to try Pamelor for this 6/3; wounds on the left lateral and left medial leg. The area medially which is the most recent of the 2 seems to have had the largest increase in Rosenberg, ZHANAE PROFFIT. ([id number]) dimensions. We have been using Sorbac to try and debride  the surface. She has been to see orthopedics they apparently did a plain x-ray that was indeterminant. Diagnosed her with neuropathy and they have ordered an MRI to determine if there is underlying osteomyelitis. This was not high on my thought list but I suppose it is prudent. We have advised her to make an appointment with vein and vascular in [place]. She has a history of a left greater saphenous and accessory vein ablations I wonder if there is anything else that can be done from a surgical point of view to help in these difficult refractory wounds. We have previously healed this wound on one occasion but it keeps on reopening [medial side] 6/10; deep tissue culture I did last week I think on the left medial wound showed both moderate E.  coli and moderate staph aureus [MSSA]. She is going to require antibiotics and I have chosen Augmentin. We have been using Sorbact and we have made better looking wound surface on both sides but certainly no improvement in wound area. She was back in last Friday apparently for a dressing changes the wrap was hurting her outer left ankle. She has not managed to get a hold of vein and vascular in [place]. We are going to have to make her that appointment 6/17; patient is tolerating the Augmentin. She had an MRI that I think was ordered by orthopedic surgeon this did not show osteomyelitis or an abscess did suggest cellulitis. We have been using Sorbact to the lateral and medial ankles. We have been trying to arrange a follow-up appointment with vein and vascular in [place] or did her original ablations. We apparently an area sent the request to vein and vascular in [facility] 6/24; patient has completed the Augmentin. We do not yet have a vein and vascular appointment in [place]. I am not sure what the issue is here we have asked her to call tomorrow. We are using Sorbact. Making some improvements and especially the medial wound. Both surfaces however look better medial and lateral. 7/1; the patient has been in contact with vein and vascular in [place] but has not yet received an appointment. Using Sorbact we have gradually improve the wound surface with no improvement in surface area. She is approved for Apligraf but the wound surface still is not completely viable. She has not had to come in for a dressing change 7/8; the patient has an appointment with vein and vascular on 7/31 which is a Friday afternoon. She is concerned about getting back here for [place] to dress her wounds. I think it is important to have them goal for her venous reflux/history of ablations etc. to see if anything else can be done. She apparently tested positive for 1 of the blood tests with regards to lupus and saw a  rheumatologist. He has raised the issue of vasculitis again. I have had this thought in the past however the evidence seems overwhelming that this is a venous reflux etiology. If the rheumatologist tells me there is clinical and laboratory investigation is positive for lupus I will rethink this. 7/15; the patient's wound surfaces are quite a bit better. The medial area which was her original wound now has no depth although the lateral wound which was the more recent area actually appears larger. Both with viable surfaces which is indeed better. Using Sorbact. I wanted to use Apligraf on her however there is the issue of the vein and vascular appointment on 7/31 at 2:00 in the afternoon which would not allow her  to get back to be rewrapped and they would no doubt remove the graft 7/22; the patient's wound surfaces have moderate amount of debris although generally look better. The lateral one is larger with 2 small satellite areas superiorly. We are waiting for her vein and vascular appointment on 7/31. She has been approved for Apligraf which I would like to use after th 7/29; wound surfaces have improved no debridement is required we have been using Sorbact. She sees vein and vascular on Friday with this so question of whether anything can be done to lessen the likelihood of recurrence and/or speed the healing of these areas. She is already had previous ablations. She no doubt has severe venous hypertension 8/5-Patient returns at 1 week, she was in [place] for 3 days by her podiatrist, we have been using so backed to the wound, she has increased pain in both the wounds on the left lower leg especially the more distal one on the lateral aspect 8/12-Patient returns at 1 week and she is agreeable to having debridement in both wounds on her left leg today. We have been using Sorbact, and vascular studies were reviewed at last visit 8/19; the patient arrives with her wounds fairly clean and no  debridement is required. We have used Sorbact which is really done a nice job in cleaning up these very difficult wound surfaces. The patient saw Dr. Donzetta Matters of vascular surgery on 7/31. He did not feel that there was an arterial component. He felt that her treated greater saphenous vein is adequately addressed and that the small saphenous vein did not appear to be involved significantly. She was also noted to have deep venous reflux which is not treatable. Dr. Donzetta Matters mentioned the possibility of a central obstructive component leading to reflux and he offered her central venography. She wanted to discuss this or think about it. I have urged her to go ahead with this. She has had recurrent difficult wounds in these areas which do heal but after months in the clinic. If there is anything that can be done to reduce the likelihood of this I think it is worth it. 9/2 she is still working towards getting follow-up with Dr. Donzetta Matters to schedule her CT. Things are quite a bit worse venography. I put Apligraf on 2 weeks ago on both wounds on the medial and lateral part of her left lower leg. She arrives in clinic today with 3 superficial additional wounds above the area laterally and one below the wound medially. She describes a lot of discomfort. I think these are probably wrapped injuries. Does not look like she has cellulitis. 07/20/2019 on evaluation today patient appears to be doing somewhat poorly in regard to her lower extremity ulcers. She in fact showed signs of erythema in fact we may even be dealing with an infection at this time. Unfortunately I am unsure if this is just infection or if indeed there may be some allergic reaction that occurred as a result of the Apligraf application. With that being said that would be unusual but nonetheless not impossible in this patient is one who is unfortunately allergic to quite a bit. Currently we have been using the Sorbact which seems to do as well as anything for  her. I do think we may want to obtain a culture today to see if there is anything showing up there that may need to be addressed. 9/16; noted that last week the wounds look worse in 1 week follow-up of the Apligraf.  Using Sorbact as of 2 days ago. She arrives with copious amounts of drainage and new skin breakdown on the back of the left calf. The wounds arm more substantial bilaterally. There is a fair amount of swelling in the left calf no overt DVT there is edema present I think in the left greater than right thigh. She is supposed to go on 9/28 for CT venography. The wounds on the medial and lateral calf are worse and she has new skin breakdown posteriorly at least new for me. This is almost developing into a circumferential wound area The Apligraf was taken off last week which I agree with things are not going in the right direction a culture was done we do not have that back yet. She is on Augmentin that she started 2 days ago 9/23; dressing was changed by her nurses on Monday. In general there is no improvement in the wound areas although the area looks less angry than last week. She did get Augmentin for MSSA cultured on the 14th. She still appears to have too much swelling in the left leg even with 3 layer compression 9/30; the patient underwent her procedure on 9/28 by Dr. Donzetta Matters at [facility]. She was discovered to have the common iliac vein measuring 12.2 mm but at the level of L4-L5 measured 3 mm. After stenting it measured 10 mm. 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 Rouleaux flow in the common femoral and femoral vein was observed much improved after stenting. We are using silver alginate to the wounds on the medial and lateral ankle on the left. 4 layer compression 10/7; the patient had fluid swelling around her knee and 4 layer compression. At the advice of vein and vascular this was reduced to 3 layer which she is tolerating better. We have been  using silver alginate under 3 layer compression since last Friday 10/14; arrives with the areas on the left ankle looking a lot better. Inflammation in the area also a lot better. She came in for a nurse check on 10/9 10/21; continued nice improvement. Slight improvements in surface area of both the medial and lateral wounds on the left. A lot of the satellite lesions in the weeping erythema around these from stasis dermatitis is resolved. We have been using silver alginate TOMMA, EHINGER ([id number]) 10/28; general improvement in the entire wound areas although not a lot of change in dimensions the wound certainly looks better. There is a lot less in terms of venous inflammation. Continue silver alginate this week however look towards Hydrofera Blue next week 11/4; very adherent debris on the medial wound left wound is not as bad. We have been using silver alginate. Change to [facility] today 11/11; very adherent debris on both wound areas. She went to vein and vascular last week and follow-up they put in [place] boot on this today. He says the [facility] was adherent. Wound is definitely not as good as last week. Especially on the left there the satellite lesions look more prominent 11/18; absolutely no better. erythema on lateral aspect with tenderness. 09/30/2019 on evaluation today patient appears to actually be doing better. Dr. Dellia Nims did put her on doxycycline last week which I do believe has helped her at this point. Fortunately there is no signs of active infection at this time. No fevers, chills, nausea, vomiting, or diarrhea. I do believe he may want extend the doxycycline for 7 additional days just to ensure everything does  completely cleared up the patient is in agreement with that plan. Otherwise she is going require some sharp debridement today 12/2; patient is completing a 2-week course of doxycycline. I gave her this empirically for inflammation as well as infection when I  last saw her 2 weeks ago. All of this seems to be better. She is using silver alginate she has the area on the medial aspect of the larger area laterally and the 2 small satellite regions laterally above the major wound. 12/9; the patient's wound on the left medial and left lateral calf look really quite good. We have been using silver alginate. She saw vein and vascular in follow-up on 10/09/2019. She has had a previous left greater saphenous vein ablation by Dr. Oscar La in 2016. More recently she underwent a left common iliac vein stent by Dr. 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 The swelling is improved and certainly the wounds have improved. The patient shows [place] today area on the right medial calf there is almost no wound but leaking lymphedema. She says she start this started 3 or 4 days ago. She did not traumatize it. It is not painful. She does not wear compression on that side 12/16; the patient continues to do well laterally. Medially still requiring debridement. The area on the right calf did not materialize to anything and is not currently open. We wrapped this last time. She has support stockings for that leg although I am not sure they are going to provide adequate compression 12/23; the lateral wound looks stable. Medially still requiring debridement for tightly adherent fibrinous debris. We've been using silver alginate. Surface area not any different 12/30; neither wound is any better with regards to surface and the area on the left lateral is larger. I been using silver alginate to the left lateral which look quite good last week and Sorbact to the left medial 11/11/2019. Lateral wound area actually looks better and somewhat smaller. Medial still requires a very aggressive debridement today. We have been using Sorbact on both wound areas 1/13; not much better still adherent debris bilaterally. I been using Sorbact. She has severe venous hypertension. Probably some degree  of dermal fibrosis distally. I wonder whether tighter compression might help and I am going to try that today. We also need to work on the bioburden 1/20; using Sorbact. She has severe venous hypertension status post stent placement for pelvic vein compression. We applied gentamicin last time to see if we could reduce bioburden I had some discussion with her today about the use of pentoxifylline. This is occasionally used in this setting for wounds with refractory venous insufficiency. However this interacts with Plavix. She tells me that she was put on this after stent placement for 3 months. She will call Dr. Claretha Cooper office to discuss 1/27; we are using gentamicin under Sorbact. 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 She has a stent. Wound medially is measuring smaller this week. Laterally measuring slightly larger although she has some satellite lesions superiorly 2/3; gentamicin under Sorbact under 4-layer compression. 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 She has a stent on Plavix. Her wounds are measuring smaller this week. More substantially laterally where there is a satellite lesion superiorly. 2/10; gentamicin under Sorbac. 4-layer compression. Patient communicated with Dr. Donzetta Matters at vein and vascular in [place]. He is okay with the patient coming off Plavix I will therefore start her on pentoxifylline for a 1 month trial. In general her  wounds look better today. I had some concerns about swelling in the left thigh however she measures 61.5 on the right and 63 on the mid thigh which does not suggest there is any difficulty. The patient is not describing any pain. 2/17; gentamicin under Sorbac 4-layer compression. She has been on pentoxifylline for 1 week and complains of loose stool. No nausea she is eating and drinking well 2/24; the patient apparently came in 2 days ago for a nurse visit when her wrap fell down. Both areas  look a little worse this week macerated medially and satellite lesions laterally. Change to silver alginate today 3/3; wounds are larger today especially medially. She also has more swelling in her foot lower leg and I even noted some swelling in her posterior thigh which is tender. I wonder about the patency of her stent. Fortuitously she sees Dr. Claretha Cooper group on Friday 3/10; Mrs. Cashwell was seen by vein and vascular on 3/5. The patient underwent ultrasound. There was no evidence of thrombosis involving the IVC no evidence of thrombosis involving the right common iliac vein there is no evidence of thrombosis involving the right external iliac vein the left external vein is also patent. The right common iliac vein stent appears patent bilateral common femoral veins are compressible and appear patent. I was concerned about the left common iliac stent however it looks like this is functional. She has some edema in the posterior thigh that was tender she still has that this week. I also note they had trouble finding the pulses in her left foot and booked her for an ABI baseline in 4 weeks. She will follow up in 6 months for repeat IVC duplex. The patient stopped the pentoxifylline because of diarrhea. It does not look like that was being effective in any case. I have advised her to go back on her aspirin 81 mg tablet, vascular it also suggested this 3/17; comes in today with her wound surfaces a lot better. The excoriations from last week considerably better probably secondary to the TCA. We have been using silver alginate 3/24; comes in today with smaller wounds both medially and laterally. Both required debridement. There are 2 small satellite areas superiorly laterally. She also has a very odd bandlike area in the mid calf almost looking like there was a weakness in the wrap in a localized area. I would write this off as being this however anteriorly she has a small raised ballotable area that is  very tender almost reminiscent of an abscess but there was no obvious purulent surface to it. 02/04/20 upon evaluation today patient appears to be doing fairly well in regard to her wounds today. Fortunately there is no signs of active infection at this time. No fevers, chills, nausea, vomiting, or diarrhea. She has been tolerating the dressing changes without complication. Fortunately I feel like she is showing signs of improvement although has been sometime since have seen her. Nonetheless the area of concern that Dr. Dellia Nims had last week where she had possibly an area of the wrap that was we can allow the leg to bulge appears to be doing significantly better today there is no signs of anything worsening. EMILLIE, CHASEN ([id number]) 4/7; the patient's wounds on her medial and lateral left leg continue to contract. We have been using a regular alginate. Last week she developed an area on the right medial lower leg which is probably a venous ulcer as well. 4/14; the wounds on her left medial and lateral  lower leg continue to contract. Surface eschar. We have been using regular alginate. The area on the right medial lower leg is closed. We have been putting both legs under 4-layer contraction. The patient went back to see vein and vascular she had arterial studies done which were apparently "quite good" per the patient although I have not read their notes I have never felt she had an arterial issue. 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 This is been longstanding and refractory to exercise, leg elevation and longstanding use of compression wraps in our clinic as well as compression stockings on the times we have been able to get these to heal 4/21; we thought she actually might be close this week however she arrives in clinic with a lot of edema in her upper left calf and into her posterior thigh. This is been an intermittent problem here. She says the  wrap fell down but it was replaced with a nurse visit on Monday. We are using calcium alginate to the wounds and the wound sizes there not terribly larger than last week but there is a lot more edema 4/28; again wound edges are smaller on both sides. Her edema is better controlled than last time. She is obtained her compression pumps from [employer] although they have not been to her home to set these up. 5/5; left medial and left lateral both look stable. I am not sure the medial is any smaller. We have been using calcium alginate under 4-layer compression. She had an area on the right medial. This was eschared today. We have been wrapping this as well. She does not tolerate external compression stockings due to a history of various contact allergies. She has her compression pumps however the representative from the company is coming on her to show her how to use these tomorrow 5/19; patient with severe chronic venous insufficiency secondary to central venous disease. She had a stent placed in her left common iliac vein. She has done better since but still difficult to control wounds. She comes in today with nothing open on the right leg. Her areas on the left medial and left lateral are just about closed. We are using calcium alginate under 4-layer compression. She is using her external compression pumps at home She only has 15-20 support stockings. States she cannot get anything tighter than that on. 03/30/20-Patient returns at 1 week, the wounds on the left leg are both slightly bigger, the last week she was on 3 layer compression which started to slide down. She is starting to use her lymphedema pumps although she stated on 1 day her right ankle started to swell up and she have to stop that day. Unfortunately the open area seem to oscillate between improving to the point of healing and then flaring up all to do with effectiveness of compression or lack of due to the left leg topography  not keeping the compression wraps from rolling down 6/2 patient comes in with a 15/20 mmHg stocking on the right leg. She tells me that she developed a lot of swelling in her ankles she saw orthopedics she was felt to possibly be having a flare of pseudogout versus some other type of arthritis. She was put on steroids for a respiratory issue so that helps with the inflammation. She has not been using the pumps all week. She thinks the left thigh is more swollen than usual and I would agree with that. She has an appointment with  Dr. Donzetta Matters 9 days or so from now 6/9; both wounds on the left medial and left lateral are smaller. We have been using calcium alginate under compression. She does not have an open wound on the right leg she is using a stocking and her compression pumps things are going well. She has an appointment with Dr. Donzetta Matters with regards to her stent in the left common iliac vein 6/16; the wounds on the left medial and left lateral ankle continues to contract. The patient saw Dr. Donzetta Matters and I think he seems satisfied. Ordered follow-up venous reflux studies on both sides in September. Cautioned that she may need thigh-high stockings. She has been using calcium alginate under compression on the left and her own stocking on the right leg. She tells [place] there are no open wounds on the right 6/23; left lateral is just about closed. Medial required debridement today. We have been using calcium alginate. Extensive discussion about the compression pumps she is only using these on 25 mmHg states she could not take 40 or 30 when the wrap came out to her home to demonstrate these. He said they should not feel tight 6/30; the left lateral wound has a slight amount of eschar. . The area medially is about the same using Hydrofera Blue. 7/7; left lateral wound still has some eschar. I will remove this next week may be closed. The area medially is very small using Hydrofera Blue with  improvement. Unfortunately the stockings fell down. Unfortunately the blisters have developed at the edge of where the wrap fell. When this happened she says her legs hurt she did not use her pumps. We are not open Monday for her to come in and change the wraps and she had an appointment yesterday. She also tells me that she is going to have an MRI of her back. She is having pain radiating into her left anterior leg she thinks her from an L5 disc. She saw Dr. Ellene Route of neurosurgery 7/14; the area on the left lateral ankle area is closed. Still a small area medially however it looks better as well. We have been using Hydrofera Blue under 4-layer compression 7/21; left lateral ankle is still closed however her wound on the medial left calf is actually larger. This is probably because Hydrofera Blue got stuck to the wound. She came in for a nurse change on Friday and will do that again this week I was concerned about the amount of swelling that she had last week however she is using her compression pumps twice a day and the swelling seems well controlled 7/28; remaining wound on the left medial lower leg is smaller. We have been using moistened silver collagen under compression she is coming back for a nurse visit. For reasons that were not really clear she was just keeping her legs elevated and not using her compression pumps. I have asked her to use the compression pumps. She does not have any wounds on the right leg 06/15/20-Patient returns at 2 weeks, her LLE edema is worse and she developed a blister wound that is new and has bigger posterior calf wound on right, we are using Prisma with pad, 4 layer compression. she has been on lasix 40 mg daily 8/18; patient arrives today with things a lot worse than I remember from a few weeks ago. She was seen last week. Noted that her edema was worse and that she now had a left lateral wound as well as deteriorating edema in the  medial and posterior part of the  lower leg. She says she is using his or her external compression pumps once a day although I wonder about the compliance. 8/25; weeping area on the right medial lower leg. This had actually gotten a small localized area of her compression stocking wet. On the left side there is a large denuded area on the posterior medial lower leg and smaller area on the lateral. This was not the original areas that we dealt with. Objective HELON, WISINSKI ([id number]) Constitutional Patient is hypertensive.. Pulse regular and within target range for patient.Marland Kitchen Respirations regular, non-labored and within target range.. Temperature is normal and within the target range for the patient.Marland Kitchen appears in no distress. Vitals Time Taken: 2:45 AM, Height: 63 in, Weight: 224.7 lbs, BMI: 39.8, Temperature: 98.4 F, Pulse: 65 bpm, Respiratory Rate: 20 breaths/min, Blood Pressure: 151/73 mmHg. Cardiovascular Pedal pulses are palpable. The edema control in her legs is not horrible. She does indeed have a weeping area on the medial part of the right lower leg this looks like lymphedema fluid.. General Notes: Wound exam New this week on the right medial calf a small draining area which is pinpoint. This looks like lymphedema fluid. On the left medial lower leg a reasonably large wound on the posterior medial part of her calf. This is tender but not acutely infected. Laterally the area is also is more superior but looks as though it is largely epithelialized. Integumentary (Hair, Skin) Wound #12 status is Open. Original cause of wound was Gradually Appeared. The wound is located on the Left,Lateral Lower Leg. The wound measures 2cm length x 1.5cm width x 0.1cm depth; 2.356cm^2 area and 0.236cm^3 volume. There is Fat Layer (Subcutaneous Tissue) exposed. There is a medium amount of serous drainage noted. The wound margin is flat and intact. There is large (67-100%) red granulation within the wound bed. There is no necrotic tissue  within the wound bed. Wound #5 status is Open. Original cause of wound was Gradually Appeared. The wound is located on the Left,Medial Lower Leg. The wound measures 3cm length x 3cm width x 0.1cm depth; 7.069cm^2 area and 0.707cm^3 volume. There is a medium amount of serosanguineous drainage noted. The wound margin is flat and intact. There is small (1-33%) red granulation within the wound bed. There is a large (67-100%) amount of necrotic tissue within the wound bed including Adherent Slough. 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 There was a Four Layer Compression Therapy Procedure by Cornell Barman, RN. Post procedure Diagnosis Wound #12: Same as Pre-Procedure Wound #5 Pre-procedure diagnosis of Wound #5 is a Lymphedema located on the Left,Medial Lower Leg . There was a Four Layer Compression Therapy Procedure by Cornell Barman, RN. Post procedure Diagnosis Wound #5: Same as Pre-Procedure There was a Four Layer Compression Therapy Procedure by Cornell Barman, RN. Post procedure Diagnosis Wound #: Same as Pre-Procedure Plan Anesthetic (add to Medication List): Wound #12 Left,Lateral Lower Leg: DARA, BEIDLEMAN ([id number]) Topical Lidocaine 4% cream applied to wound bed prior to debridement (In Clinic Only). Wound #5 Left,Medial Lower Leg: Topical Lidocaine 4%  cream applied to wound bed prior to debridement (In Clinic Only). Primary Wound Dressing: Wound #12 Left,Lateral Lower Leg: Hydrafera Blue Ready Transfer Wound #5  Left,Medial Lower Leg: Hydrafera Blue Ready Transfer Secondary Dressing: Wound #12 Left,Lateral Lower Leg: ABD pad Wound #5 Left,Medial Lower Leg: ABD pad Dressing Change Frequency: Wound #12 Left,Lateral Lower Leg: Change dressing every week Other: - Nurse visit Friday and Monday if needed. Wound #5 Left,Medial Lower Leg: Change dressing every week Other: - Nurse visit Friday and Monday if needed. Follow-up Appointments: Wound #12 Left,Lateral Lower Leg: Return Appointment in 1 week. Nurse Visit as needed Wound #5 Left,Medial Lower Leg: Return Appointment in 1 week. Nurse Visit as needed Edema Control: Wound #12 Left,Lateral Lower Leg: 4 Layer Compression System - Bilateral Compression Pump: Use compression pump on left lower extremity for 60 minutes, twice daily. Compression Pump: Use compression pump on right lower extremity for 60 minutes, twice daily. Wound #5 Left,Medial Lower Leg: 4 Layer Compression System - Bilateral Compression Pump: Use compression pump on left lower extremity for 60 minutes, twice daily. Compression Pump: Use compression pump on right lower extremity for 60 minutes, twice daily. 1. I am going to change the primary dressings from alginate to [facility]. 2. Liberal TCA 3. 4-layer compression 4. Because of the weeping area on the right medial calf I have elected to wrap this leg as well 5. I wonder whether she is using her compression pumps, I will speak to her about this next time Electronic Signature(s) Signed: 06/30/2020 4:51:35 PM By: Linton Ham MD Entered By: Linton Ham on 06/29/2020 16:17:42 Groene, [name] ([id number]) -------------------------------------------------------------------------------- SuperBill Details Patient Name: JAZSMINE, MACARI. Date of Service: 06/29/2020 Medical Record Number: [id number] Patient Account Number: [id number] Date of Birth/Sex: 1946/03/17 (74 y.o. 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 Physician Procedures CPT4 Code Description: [id number] - WC PHYS LEVEL 3 - EST PT Modifier: Quantity: 1 CPT4 Code Description: ICD-10 Diagnosis Description I87.332 Chronic venous hypertension (idiopathic) with ulcer and inflammation of left L97.818 Non-pressure chronic ulcer of other part of right lower leg with other speci I89.0 Lymphedema, not elsewhere  classified Modifier: lower extremity fied severity Quantity: Electronic Signature(s) Signed: 06/30/2020 4:51:35 PM By: Linton Ham MD Entered By: Linton Ham on 06/29/2020 16:19:48

## 2020-07-01 ENCOUNTER — Other Ambulatory Visit: Payer: Self-pay

## 2020-07-01 DIAGNOSIS — I82592 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: Secondary | ICD-10-CM | POA: Diagnosis not present

## 2020-07-01 DIAGNOSIS — I87332 Chronic venous hypertension (idiopathic) with ulcer and inflammation of left lower extremity: Secondary | ICD-10-CM | POA: Diagnosis not present

## 2020-07-01 DIAGNOSIS — I10 Essential (primary) hypertension: Secondary | ICD-10-CM | POA: Diagnosis not present

## 2020-07-01 DIAGNOSIS — I890 Lymphedema, not elsewhere classified: Secondary | ICD-10-CM | POA: Diagnosis not present

## 2020-07-01 DIAGNOSIS — J45909 Unspecified asthma, uncomplicated: Secondary | ICD-10-CM | POA: Diagnosis not present

## 2020-07-01 DIAGNOSIS — L97221 Non-pressure chronic ulcer of left calf limited to breakdown of skin: Secondary | ICD-10-CM | POA: Diagnosis not present

## 2020-07-02 NOTE — Progress Notes (Addendum)
JENNAFER, GLADUE ([id number]) Visit Report for 07/01/2020 Arrival Information Details Patient Name: [name], [name]. Date of Service: 07/01/2020 3:15 PM Medical Record Number: [id number] Patient Account Number: [id number] Date of Birth/Sex: 03-29-1946 (74 y.o. F) Treating RN: Cornell Barman Primary Care [name] [name]: [name] Other Clinician: Referring Mical Brun: [name] Treating Areeba Sulser/Extender: Melburn Hake, HOYT Weeks in Treatment: 81 Visit Information History Since Last Visit Has Dressing in Place as Prescribed: Yes Patient Arrived: Ambulatory Has Compression in Place as Prescribed: Yes Arrival Time: 16:37 Pain Present Now: No Accompanied By: self Transfer Assistance: None Patient Identification Verified: Yes Secondary Verification Process Completed: Yes Patient Requires Transmission-Based No Precautions: Patient Has Alerts: Yes Patient Alerts: Patient on Blood Thinner aspirin 81 Electronic Signature(s) Signed: 07/01/2020 4:37:53 PM By: [name], [name], RN, CWS, [name], [name] Entered By: [name], [name], RN, CWS, Kim on 07/01/2020 16:37:53 Senne, [name] ([id number]) -------------------------------------------------------------------------------- Compression Therapy Details Patient Name: [name], [name]. Date of Service: 07/01/2020 3:15 PM Medical Record Number: [id number] Patient Account Number: [id number] Date of Birth/Sex: January 21, 1946 (74 y.o. F) Treating RN: Cornell Barman Primary Care Laquia Rosano: [name] Other Clinician: Referring Dayshaun Whobrey: [name] Treating Silvanna Ohmer/Extender: Melburn Hake, HOYT Weeks in Treatment: 81 Compression Therapy Performed for Wound Assessment: Wound #12 Left,Lateral Lower Leg Performed By: Clinician Cornell Barman, RN Compression Type: Four Layer Pre Treatment ABI: 1 Electronic Signature(s) Signed: 07/01/2020 4:39:00 PM By: [name], [name], RN, CWS, [name], [name] Entered By: [name], [name], RN, CWS, Kim on 07/01/2020 16:39:00 [name]  ([id number]) -------------------------------------------------------------------------------- Encounter Discharge Information Details Patient Name: [name], [name]. Date of Service: 07/01/2020 3:15 PM Medical Record Number: [id number] Patient Account Number: [id number] Date of Birth/Sex: [birth year] (74 y.o. F) Treating RN: Cornell Barman Primary Care Mckaylee Dimalanta: [name] Other Clinician: Referring Mahogany Torrance: [name] Treating Kristle Wesch/Extender: Melburn Hake, HOYT Weeks in Treatment: 90 Encounter Discharge Information Items Discharge Condition: Stable Ambulatory Status: Ambulatory Discharge Destination: Home Transportation: Private Auto Accompanied By: self Schedule Follow-up Appointment: Yes Clinical Summary of Care: Electronic Signature(s) Signed: 07/01/2020 4:39:57 PM By: [name], [name], RN, CWS, [name], [name] Entered By: [name], [name], RN, CWS, Kim on 07/01/2020 16:39:57 [name] ([id number]) -------------------------------------------------------------------------------- Wound Assessment Details Patient Name: [name], [name]. Date of Service: 07/01/2020 3:15 PM Medical Record Number: [id number] Patient Account Number: [id number] Date of Birth/Sex: 22-Nov-1945 (74 y.o. F) Treating RN: Cornell Barman Primary Care Clema Skousen: [name] Other Clinician: Referring Chong January: [name] Treating Teagen Mcleary/Extender: STONE III, HOYT Weeks in Treatment: 81 Wound Status Wound Number: 12 Primary Etiology: Venous Leg Ulcer Wound Location: Left, Lateral Lower Leg Wound Status: Open Wounding Event: Gradually Appeared Date Acquired: 06/15/2020 Weeks Of Treatment: 2 Clustered Wound: No Wound Measurements Length: (cm) 2 Width: (cm) 1.5 Depth: (cm) 0.1 Area: (cm) 2.356 Volume: (cm) 0.236 % Reduction in Area: -317% % Reduction in Volume: -314% Wound Description Classification: Partial Thickness Electronic Signature(s) Signed: 07/06/2020 11:13:21 AM By: [name], [name], RN, CWS,  [name], [name] Entered By: [name], [name], RN, CWS, Kim on 07/01/2020 16:38:14 Pulliam, [name] ([id number]) -------------------------------------------------------------------------------- Wound Assessment Details Patient Name: [name], [name]. Date of Service: 07/01/2020 3:15 PM Medical Record Number: [id number] Patient Account Number: [id number] Date of Birth/Sex: 1946/02/09 (74 y.o. F) Treating RN: Cornell Barman Primary Care Curt Oatis: [name] Other Clinician: Referring Cleven Jansma: [name] Treating Verl Whitmore/Extender: STONE III, HOYT Weeks in Treatment: 81 Wound Status Wound Number: 5 Primary Etiology: Lymphedema Wound Location: Left, Medial Lower Leg Wound Status: Open Wounding Event: Gradually Appeared Date Acquired: 11/19/2018 Weeks Of Treatment: 81 Clustered  Wound: No Wound Measurements Length: (cm) 3 Width: (cm) 3 Depth: (cm) 0.1 Area: (cm) 7.069 Volume: (cm) 0.707 % Reduction in Area: 17.3% % Reduction in Volume: 17.3% Wound Description Classification: Full Thickness Without Exposed Support Structure s Electronic Signature(s) Signed: 07/06/2020 11:13:21 AM By: [name], [name], RN, CWS, [name], [name] Entered By: [name], [name], RN, CWS, Kim on 07/01/2020 16:38:14

## 2020-07-04 ENCOUNTER — Other Ambulatory Visit: Payer: Self-pay

## 2020-07-04 DIAGNOSIS — I82592 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: Secondary | ICD-10-CM | POA: Diagnosis not present

## 2020-07-04 DIAGNOSIS — J45909 Unspecified asthma, uncomplicated: Secondary | ICD-10-CM | POA: Diagnosis not present

## 2020-07-04 DIAGNOSIS — I890 Lymphedema, not elsewhere classified: Secondary | ICD-10-CM | POA: Diagnosis not present

## 2020-07-04 DIAGNOSIS — I87332 Chronic venous hypertension (idiopathic) with ulcer and inflammation of left lower extremity: Secondary | ICD-10-CM | POA: Diagnosis not present

## 2020-07-04 DIAGNOSIS — I10 Essential (primary) hypertension: Secondary | ICD-10-CM | POA: Diagnosis not present

## 2020-07-04 DIAGNOSIS — L97221 Non-pressure chronic ulcer of left calf limited to breakdown of skin: Secondary | ICD-10-CM | POA: Diagnosis not present

## 2020-07-06 ENCOUNTER — Other Ambulatory Visit: Payer: Self-pay

## 2020-07-06 ENCOUNTER — Encounter: Payer: Medicare Other | Attending: Internal Medicine | Admitting: Internal Medicine

## 2020-07-06 DIAGNOSIS — I872 Venous insufficiency (chronic) (peripheral): Secondary | ICD-10-CM | POA: Insufficient documentation

## 2020-07-06 DIAGNOSIS — G4733 Obstructive sleep apnea (adult) (pediatric): Secondary | ICD-10-CM | POA: Insufficient documentation

## 2020-07-06 DIAGNOSIS — I10 Essential (primary) hypertension: Secondary | ICD-10-CM | POA: Insufficient documentation

## 2020-07-06 DIAGNOSIS — J45909 Unspecified asthma, uncomplicated: Secondary | ICD-10-CM | POA: Diagnosis not present

## 2020-07-06 DIAGNOSIS — I890 Lymphedema, not elsewhere classified: Secondary | ICD-10-CM | POA: Diagnosis not present

## 2020-07-06 DIAGNOSIS — L97221 Non-pressure chronic ulcer of left calf limited to breakdown of skin: Secondary | ICD-10-CM | POA: Insufficient documentation

## 2020-07-06 DIAGNOSIS — L97818 Non-pressure chronic ulcer of other part of right lower leg with other specified severity: Secondary | ICD-10-CM | POA: Insufficient documentation

## 2020-07-06 DIAGNOSIS — I87331 Chronic venous hypertension (idiopathic) with ulcer and inflammation of right lower extremity: Secondary | ICD-10-CM | POA: Diagnosis not present

## 2020-07-06 DIAGNOSIS — G629 Polyneuropathy, unspecified: Secondary | ICD-10-CM | POA: Insufficient documentation

## 2020-07-06 DIAGNOSIS — Z7902 Long term (current) use of antithrombotics/antiplatelets: Secondary | ICD-10-CM | POA: Diagnosis not present

## 2020-07-06 IMAGING — MR MRI OF LOWER LEFT EXTREMITY WITHOUT CONTRAST
4 of 5 series · 20 of 40 positions shown · non-contrast
Comparison: Plain films left knee 08/12/2018.

CLINICAL DATA: Nonhealing skin ulcerations on the left lower leg.

EXAM:
MRI OF LOWER LEFT EXTREMITY WITHOUT CONTRAST
TECHNIQUE: Multiplanar, multisequence MR imaging of the left lower leg. Was
performed. No intravenous contrast was administered.

[Series 3: T1 · coronal · left · 3.0mm · 0.89mm/px · 5 of 27 slices shown (1 of 2)]
[im 1/27]
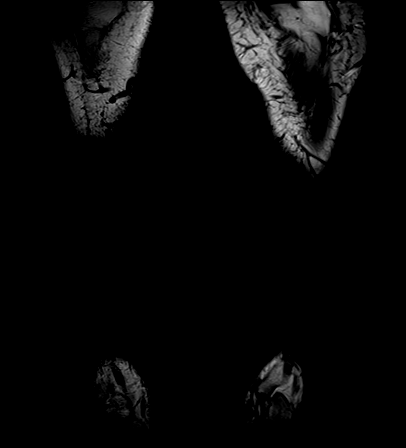
[im 7/27]
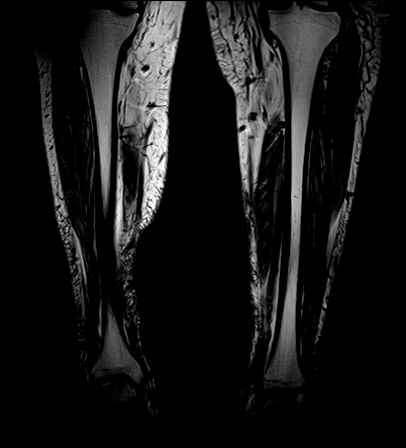
[im 14/27]
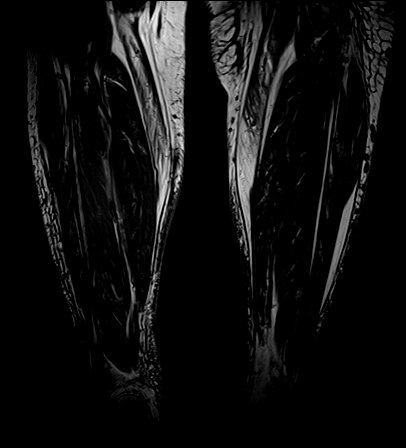
[im 20/27]
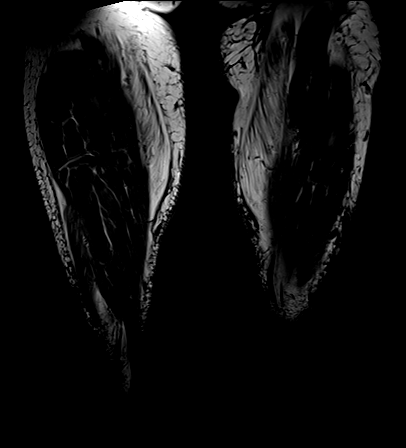
[im 27/27]
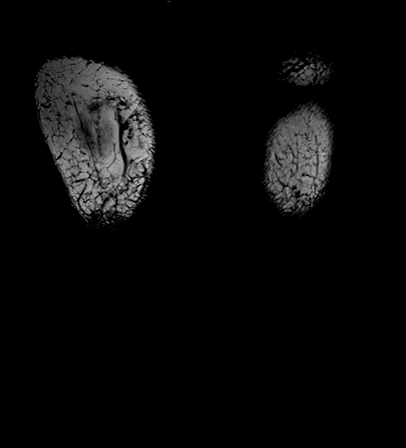

[Series 4: T1 · axial · left · 4.0mm · 0.31mm/px · z∈[-155,+110]mm · 3 of 74 slices shown (2 of 2)]
[im 14/74]
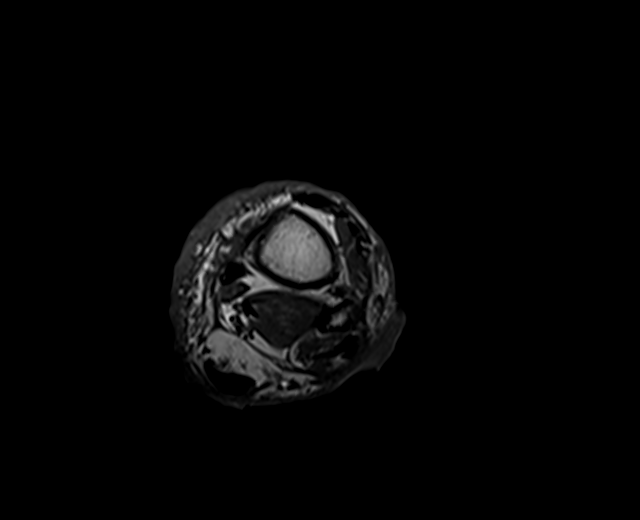
[im 40/74]
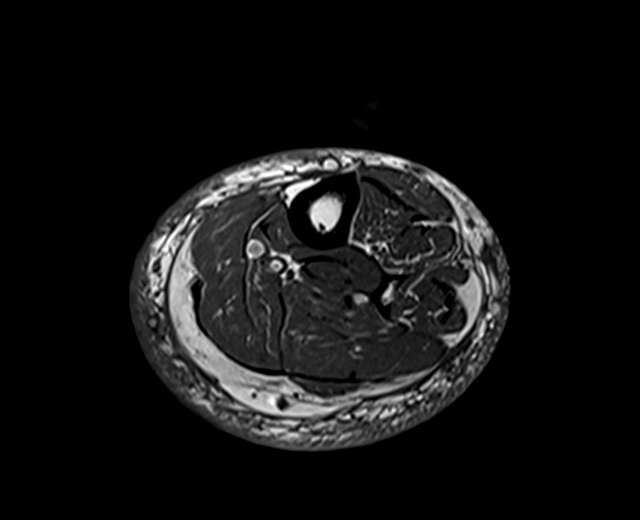
[im 67/74]
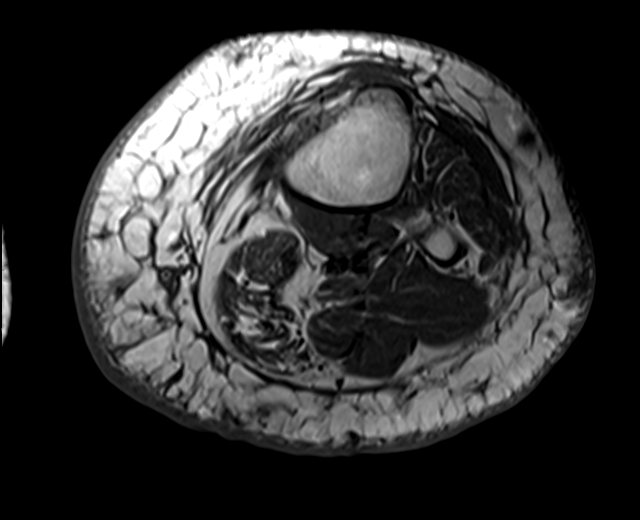

[Series 5: T2 fat-sat · axial · left · 4.0mm · 0.31mm/px · z∈[-220,+145]mm · 9 of 74 slices shown]
[im 1/74]
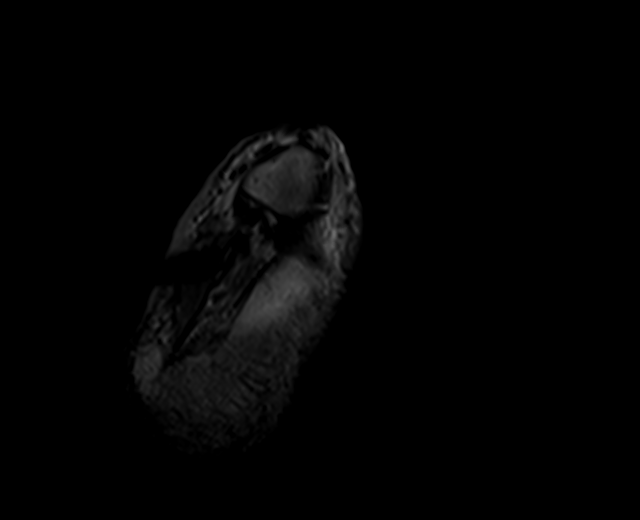
[im 14/74]
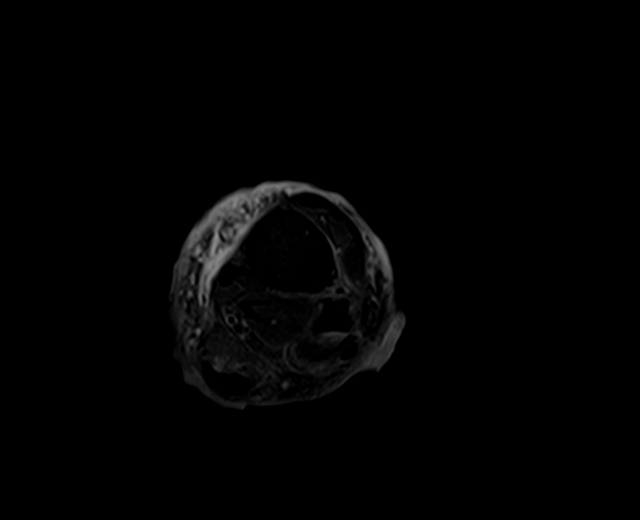
[im 20/74]
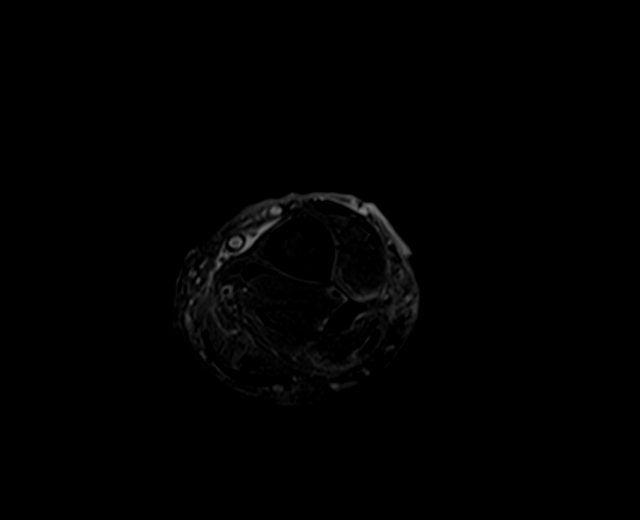
[im 34/74]
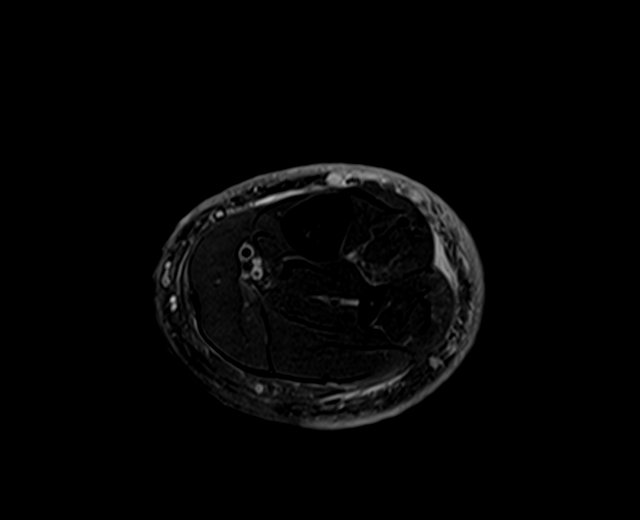
[im 40/74]
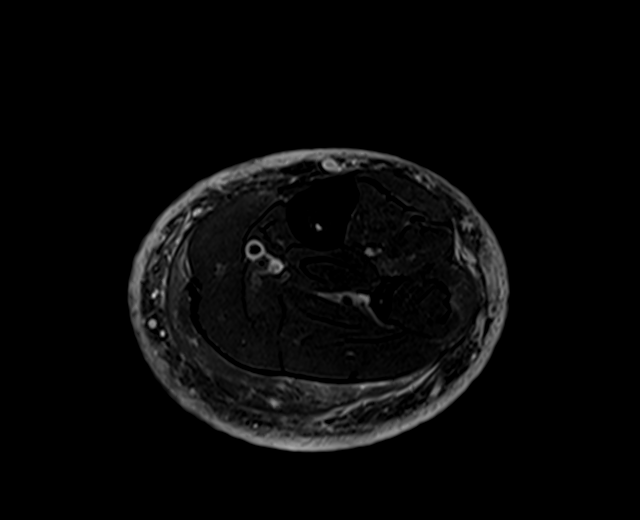
[im 54/74]
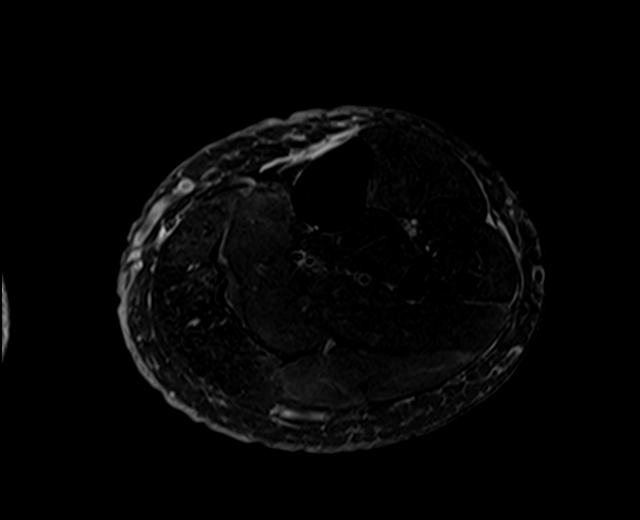
[im 60/74]
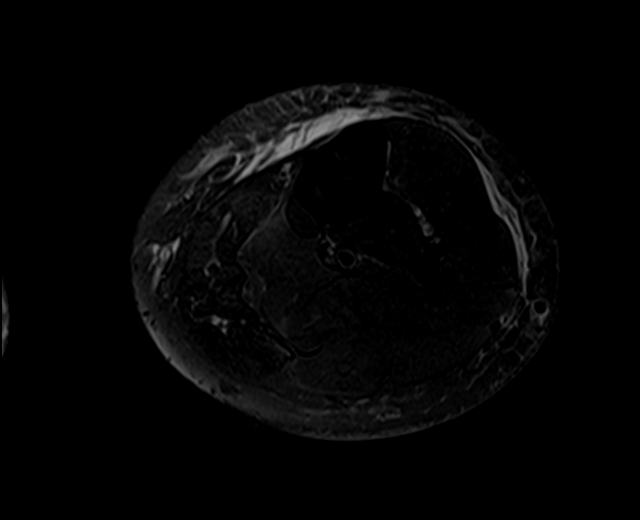
[im 67/74]
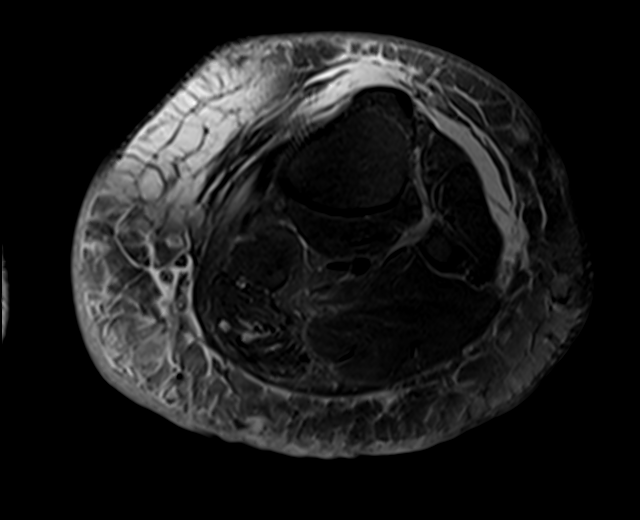
[im 74/74]
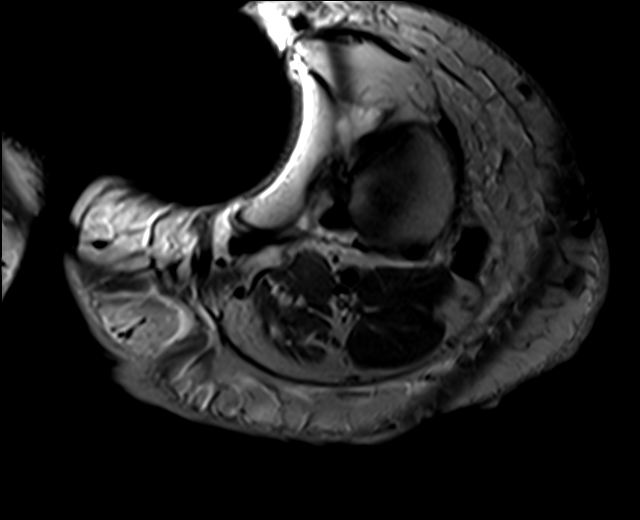

[Series 6: STIR · coronal · left · 3.0mm · 1.38mm/px · 3 of 30 slices shown]
[im 1/30]
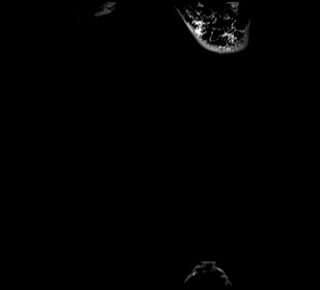
[im 15/30]
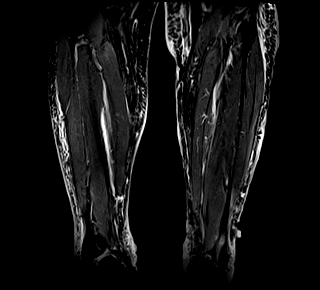
[im 30/30]
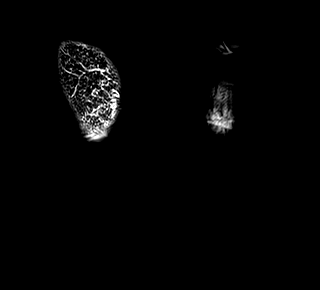

[20 of 40 positions shown; findings below may reference images not displayed]

FINDINGS: Bones/Joint/Cartilage

Marrow signal is normal throughout. There is no evidence of
osteomyelitis. No fracture, stress change or worrisome lesion is
identified.

Ligaments

Negative.

Muscles and Tendons

No intramuscular fluid collection is identified. No tear or strain.
There is marked fatty atrophy of the medial gastrocnemius.

Soft tissues

Diffuse subcutaneous edema is most intense distally. Skin
ulcerations are noted posteriorly and laterally. No underlying
abscess.
IMPRESSION: Skin ulcerations about the distal lower leg without underlying
abscess, myositis or osteomyelitis. Diffuse subcutaneous edema is
worst distally and consistent with dependent change and/or
cellulitis.

## 2020-07-06 NOTE — Progress Notes (Signed)
CLYDETTE, PRIVITERA ([id number]) Visit Report for 07/04/2020 Arrival Information Details Patient Name: MARNI, FRANZONI. Date of Service: 07/04/2020 11:15 AM Medical Record Number: [id number] Patient Account Number: [id number] Date of Birth/Sex: [birth year] (74 y.o. F) Treating RN: Cornell Barman Primary Care Kristine Tiley: [name] Other Clinician: Referring Gerren Hoffmeier: [name] Treating Michaeljohn Biss/Extender: Melburn Hake, HOYT Weeks in Treatment: 81 Visit Information History Since Last Visit All ordered tests and consults were completed: No Patient Arrived: Ambulatory Added or deleted any medications: No Arrival Time: 12:03 Any new allergies or adverse reactions: No Accompanied By: self Had a fall or experienced change in No Transfer Assistance: None activities of daily living that may affect Patient Identification Verified: Yes risk of falls: Secondary Verification Process Completed: Yes Signs or symptoms of abuse/neglect since last visito No Patient Requires Transmission-Based No Hospitalized since last visit: No Precautions: Implantable device outside of the clinic excluding No Patient Has Alerts: Yes cellular tissue based products placed in the center Patient Alerts: Patient on Blood since last visit: Thinner Has Dressing in Place as Prescribed: Yes aspirin 81 Has Compression in Place as Prescribed: Yes Pain Present Now: No Electronic Signature(s) Signed: 07/04/2020 2:14:51 PM By: [name] Entered By: [name] on 07/04/2020 12:04:59 Mccary, [name] ([id number]) -------------------------------------------------------------------------------- Compression Therapy Details Patient Name: VIRGEN, BELLAND. Date of Service: 07/04/2020 11:15 AM Medical Record Number: [id number] Patient Account Number: [id number] Date of Birth/Sex: 1946/11/05 (74 y.o. F) Treating RN: Cornell Barman Primary Care Asma Boldon: [name] Other Clinician: Referring Gaudencio Chesnut: [name] Treating Markel Kurtenbach/Extender: Melburn Hake, HOYT Weeks in Treatment: 81 Compression Therapy Performed for Wound Assessment: Wound #12 Left,Lateral Lower Leg Performed By: Clinician Cornell Barman, RN Compression Type: Four Layer Pre Treatment ABI: 1 Electronic Signature(s) Signed: 07/05/2020 8:54:42 AM By: [name], [name], RN, CWS, [name], [name] Entered By: [name], [name], RN, CWS, Kim on 07/05/2020 08:54:41 [name] ([id number]) -------------------------------------------------------------------------------- Encounter Discharge Information Details Patient Name: CAMARYN, LUMBERT. Date of Service: 07/04/2020 11:15 AM Medical Record Number: [id number] Patient Account Number: [id number] Date of Birth/Sex: [birth year] (73 y.o. F) Treating RN: Cornell Barman Primary Care Kanyah Matsushima: [name] Other Clinician: Referring Joash Tony: [name] Treating Loui Massenburg/Extender: Melburn Hake, HOYT Weeks in Treatment: 44 Encounter Discharge Information Items Discharge Condition: Stable Ambulatory Status: Ambulatory Discharge Destination: Home Transportation: Private Auto Accompanied By: self Schedule Follow-up Appointment: Yes Clinical Summary of Care: Electronic Signature(s) Signed: 07/05/2020 8:55:18 AM By: [name], [name], RN, CWS, [name], [name] Entered By: [name], [name], RN, CWS, Kim on 07/05/2020 08:55:18 [name] ([id number]) -------------------------------------------------------------------------------- Wound Assessment Details Patient Name: JAKI, STEPTOE. Date of Service: 07/04/2020 11:15 AM Medical Record Number: [id number] Patient Account Number: [id number] Date of Birth/Sex: Feb 24, 1946 (74 y.o. F) Treating RN: Cornell Barman Primary Care Adalyna Godbee: [name] Other Clinician: Referring Carnell Beavers: [name] Treating Jelani Trueba/Extender: Melburn Hake, HOYT Weeks in Treatment: 81 Wound Status Wound Number: 12 Primary Venous Leg Ulcer Etiology: Wound Location: Left, Lateral Lower  Leg Wound Open Wounding Event: Gradually Appeared Status: Date Acquired: 06/15/2020 Comorbid Cataracts, Asthma, Sleep Apnea, Deep Vein Thrombosis, Weeks Of Treatment: 2 History: Hypertension, Peripheral Venous Disease, Osteoarthritis, Clustered Wound: No Received Chemotherapy, Received Radiation Photos Wound Measurements Length: (cm) 2.3 Width: (cm) 1.5 Depth: (cm) 0.1 Area: (cm) 2.71 Volume: (cm) 0.271 % Reduction in Area: -379.6% % Reduction in Volume: -375.4% Epithelialization: Small (1-33%) Tunneling: No Undermining: No Wound Description Classification: Partial Thickness Foul Exudate Amount: Medium Slou Exudate Type: Serosanguineous Exudate Color: red, brown Odor After Cleansing: No gh/Fibrino No Wound Bed Granulation  Amount: Small (1-33%) Exposed Structure Granulation Quality: Red Fascia Exposed: No Necrotic Amount: Large (67-100%) Fat Layer (Subcutaneous Tissue) Exposed: Yes Necrotic Quality: Adherent Slough Tendon Exposed: No Muscle Exposed: No Joint Exposed: No Bone Exposed: No Treatment Notes Wound #12 (Left, Lateral Lower Leg) Notes Hydrafera blue, abd, 4LL, unna to anchor Electronic Signature(s) Signed: 07/04/2020 2:14:51 PM By: Gena Fray ([id number]) Signed: 07/06/2020 11:13:21 AM By: [name], [name], RN, CWS, [name], [name] Entered By: [name] on 07/04/2020 12:09:56 JANNETTA, MASSEY ([id number]) -------------------------------------------------------------------------------- Wound Assessment Details Patient Name: RUCHI, STONEY. Date of Service: 07/04/2020 11:15 AM Medical Record Number: [id number] Patient Account Number: [id number] Date of Birth/Sex: 1946-09-06 (74 y.o. F) Treating RN: Cornell Barman Primary Care Boneta Standre: [name] Other Clinician: Referring Janya Eveland: [name] Treating Aariana Shankland/Extender: Melburn Hake, HOYT Weeks in Treatment: 81 Wound Status Wound Number: 5 Primary Lymphedema Etiology: Wound  Location: Left, Medial Lower Leg Wound Open Wounding Event: Gradually Appeared Status: Date Acquired: 11/19/2018 Comorbid Cataracts, Asthma, Sleep Apnea, Deep Vein Thrombosis, Weeks Of Treatment: 81 History: Hypertension, Peripheral Venous Disease, Osteoarthritis, Clustered Wound: No Received Chemotherapy, Received Radiation Photos Wound Measurements Length: (cm) 5 Width: (cm) 4 Depth: (cm) 0.1 Area: (cm) 15.708 Volume: (cm) 1.571 % Reduction in Area: -83.8% % Reduction in Volume: -83.7% Wound Description Classification: Full Thickness Without Exposed Support Structu Exudate Amount: Medium Exudate Type: Serosanguineous Exudate Color: red, brown res Foul Odor After Cleansing: No Slough/Fibrino No Wound Bed Granulation Amount: Small (1-33%) Exposed Structure Granulation Quality: Red Fascia Exposed: No Necrotic Amount: Large (67-100%) Fat Layer (Subcutaneous Tissue) Exposed: Yes Necrotic Quality: Adherent Slough Tendon Exposed: No Muscle Exposed: No Joint Exposed: No Bone Exposed: No Treatment Notes Wound #5 (Left, Medial Lower Leg) Notes Hydrafera blue, abd, 4LL, unna to anchor Electronic Signature(s) Signed: 07/04/2020 2:14:51 PM By: Gena Fray ([id number]) Signed: 07/06/2020 11:13:21 AM By: [name], [name], RN, CWS, [name], [name] Entered By: [name] on 07/04/2020 12:08:55

## 2020-07-08 ENCOUNTER — Other Ambulatory Visit: Payer: Self-pay

## 2020-07-08 DIAGNOSIS — I10 Essential (primary) hypertension: Secondary | ICD-10-CM | POA: Diagnosis not present

## 2020-07-08 DIAGNOSIS — I872 Venous insufficiency (chronic) (peripheral): Secondary | ICD-10-CM | POA: Diagnosis not present

## 2020-07-08 DIAGNOSIS — I890 Lymphedema, not elsewhere classified: Secondary | ICD-10-CM | POA: Diagnosis not present

## 2020-07-08 DIAGNOSIS — L97221 Non-pressure chronic ulcer of left calf limited to breakdown of skin: Secondary | ICD-10-CM | POA: Diagnosis not present

## 2020-07-08 DIAGNOSIS — G4733 Obstructive sleep apnea (adult) (pediatric): Secondary | ICD-10-CM | POA: Diagnosis not present

## 2020-07-08 DIAGNOSIS — L97818 Non-pressure chronic ulcer of other part of right lower leg with other specified severity: Secondary | ICD-10-CM | POA: Diagnosis not present

## 2020-07-08 NOTE — Progress Notes (Signed)
[name], [name] ([id number]) Visit Report for 07/06/2020 HPI Details Patient Name: [name], [name]. Date of Service: 07/06/2020 12:30 PM Medical Record Number: [id number] Patient Account Number: [id number] Date of Birth/Sex: 11-Feb-1946 (74 y.[name]. F) Treating RN: Cornell Barman Primary Care Provider: [name] Other Clinician: Referring Provider: [name] Treating Provider/Extender: Tito Dine in Treatment: 94 History of Present Illness HPI Description: Pleasant 74 year old with history of chronic venous insufficiency. No diabetes or peripheral vascular disease. Left ABI 1.29. Questionable history of left lower extremity DVT. She developed a recurrent ulceration on her left lateral calf in December 2015, which she attributes to poor diet and subsequent lower extremity edema. She underwent endovenous laser ablation of her left greater saphenous vein in 2010. She underwent laser ablation of accessory branch of left GSV in April 2016 by Dr. Kellie Simmering at [facility]. She was previously wearing Unna boots, which she tolerated well. Tolerating 2 layer compression and cadexomer iodine. She returns to clinic for follow-up and is without new complaints. She denies any significant pain at this time. She reports persistent pain with pressure. No claudication or ischemic rest pain. No fever or chills. No drainage. READMISSION 11/13/16; this is a 74 year old woman who is not a diabetic. She is here for a review of a painful area on her left medial lower extremity. I note that she was seen here previously last year for wound I believe to be in the same area. At that time she had undergone previously a left greater saphenous vein ablation by Dr. Kellie Simmering and she had a ablation of the anterior accessory branch of the left greater saphenous vein in March 2016. Seeing that the wound actually closed over. In reviewing the history with her today the ulcer in this area has been recurrent. She describes  a biopsy of this area in 2009 that only showed stasis physiology. 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 Do not see that she has ever had arterial studies in the left leg. She tells me she has a follow-up with Dr. Kellie Simmering in roughly 10 days In any case she developed the reopening of this area roughly a month ago. On the background of this she describes rapidly increasing edema which has responded to Lasix 40 mg and metolazone 2.5 mg as well as the patient's lymph massage. She has been told she has both venous insufficiency and lymphedema but she cannot tolerate compression stockings 11/28/16; the patient saw Dr. Kellie Simmering recently. Per the patient he did arterial Dopplers in the office that did not show evidence of arterial insufficiency, per the patient he stated "treat this like an ordinary venous ulcer". She also saw her dermatologist Dr. Ronnald Ramp who felt that this was more of a vascular ulcer. In general things are improving although she arrives today with increasing bilateral lower extremity edema with weeping a deeper fluid through the wound on the left medial leg compatible with some degree of lymphedema 12/04/16; the patient's wound is fully epithelialized but I don't think fully healed. We will do another week of depression with Promogran and TCA however I suspect we'll be able to discharge her next week. This is a very unusual-looking wound which was initially a figure-of-eight type wound lying on its side surrounded by petechial like hemorrhage. She  has had venous ablation on this side. She apparently does not have an arterial issue per Dr. Kellie Simmering. She saw her dermatologist thought it was "vascular". Patient is definitely going to need ongoing compression and I talked about  this with her today she will go to elastic therapy after she leaves here next week 12/11/16; the patient's wound is not completely closed today. She has surrounding scar tissue and in further discussion with the patient it would appear that she had ulcers in this area in 2009 for a prolonged period of time ultimately requiring a punch biopsy of this area that only showed venous insufficiency. I did not previously pickup on this part of the history from the patient. 12/18/16; the patient's wound is completely epithelialized. There is no open area here. She has significant bilateral venous insufficiency with secondary lymphedema to a mild-to-moderate degree she does not have compression stockings.. She did not say anything to me when I was in the room, she told our intake nurse that she was still having pain in this area. This isn't unusual recurrent small open area. She is going to go to elastic therapy to obtain compression stockings. 12/25/16; the patient's wound is fully epithelialized. There is no open area here. The patient describes some continued episodic discomfort in this area medial left calf. However everything looks fine and healed here. She is been to elastic therapy and caught herself 15-20 mmHg stockings, they apparently were having trouble getting 20-30 mm stockings in her size 01/22/17; this is a patient we discharged from the clinic a month ago. She has a recurrent open wound on her medial left calf. She had 15 mm support stockings. I told her I thought she needed 20-30 mm compression stockings. She tells me that she has been ill with hospitalization secondary to asthma and is been found to have severe hypokalemia likely secondary to a combination of Lasix and metolazone. This morning she noted blistering and leaking fluid on the posterior part of her left leg. She called our intake nurse urgently and we was saw her this afternoon. She has not had any real discomfort here. I don't know  that she's been wearing any stockings on this leg for at least 2-3 days. ABIs in this clinic were 1.21 on the right and 1.3 on the left. She is previously seen vascular surgery who does not think that there is a peripheral arterial issue. 01/30/17; Patient arrives with no open wound on the left leg. She has been to elastic therapy and obtained 20-14mmhg below knee stockings and she has one on the right leg today. READMISSION 02/19/18; this Cirrincione is a now 74 year old patient we've had in this clinic perhaps 3 times before. I had last looked at her from January 07 December 2016 with an area on the medial left leg. We discharged her on 12/25/16 however she had to be readmitted on 01/22/17 with a recurrence. I have in my notes that we discharged her on 20-30 mm stockings although she tells me she was only wearing support hose because she cannot get stockings on predominantly related to her cervical spine surgery/issues. She has had previous ablations done by vein and vascular in [place] including a great saphenous vein ablation on the left with an anterior accessory branch ablation I think both of these were in 2016. On one of the previous visit she had a biopsy noted 2009 that was negative. She is not felt to have an arterial issue. She is not a diabetic. She does  have a history of obstructive sleep apnea hypertension asthma as well as chronic venous insufficiency and lymphedema. On this occasion she noted 2 dry scaly patch on her left leg. She tried to put lotion on this it didn't really help. There were 2 open areas.the JAMERIA, [name] ([id number]) patient has been seeing her primary physician from 02/05/18 through 02/14/18. She had Unna boots applied. The superior wound now on the lateral left leg has closed but she's had one wound that remains open on the lateral left leg. This is not the same spot as we dealt with in 2018. ABIs in this clinic were 1.3 bilaterally 02/26/18; patient has a small wound on  the left lateral calf. Dimensions are down. She has chronic venous insufficiency and lymphedema. 03/05/18; small open area on the left lateral calf. Dimensions are down. Tightly adherent necrotic debris over the surface of the wound which was difficult to remove. Also the dressing [over collagen] stuck to the wound surface. This was removed with some difficulty as well. Change the primary dressing to Hydrofera Blue ready 03/12/18; small open area on the left lateral calf. Comes in with tightly adherent surface eschar as well as some adherent Hydrofera Blue. 03/19/18; open area on the left lateral calf. Again adherent surface eschar as well as some adherent Hydrofera Blue nonviable subcutaneous tissue. She complained of pain all week even with the reduction from 4-3 layer compression I put on last week. Also she had an increase in her ankle and calf measurements probably related to the same thing. 03/26/18; open area on the left lateral calf. A very small open area remains here. We used silver alginate starting last week as the Hydrofera Blue seem to stick to the wound bed. In using 4-layer compression 04/02/18; the open area in the left lateral calf at some adherent slough which I removed there is no open area here. We are able to transition her into her own compression stocking. Truthfully I think this is probably his support hose. However this does not maintain skin integrity will be limited. She cannot put over the toe compression stockings on because of neck problems hand problems etc. She is allergic to the lining layer of juxta lites. We might be forced to use extremitease stocking should this fail READMIT 11/24/2018 Patient is now a 74 year old woman who is not a diabetic. She has been in this clinic on at least 3 previous occasions largely with recurrent wounds on her left leg secondary to chronic venous insufficiency with secondary lymphedema. Her situation is complicated by inability to  get stockings on and an allergy to neoprene which is apparently a component and at least juxta lites and other stockings. As a result she really has not been wearing any stockings on her legs. She tells [place] that roughly 2 or 3 weeks ago she started noticing a stinging sensation just above her ankle on the left medial aspect. She has been diagnosed with pseudogout and she wondered whether this was what she was experiencing. She tried to dress this with something she bought at the store however subsequently it pulled skin off and now she has an open wound that is not improving. She has been using Vaseline gauze with a cover bandage. She saw her primary doctor last week who put an Haematologist on her. ABIs in this clinic was 1.03 on the left 2/12; the area is on the left medial ankle. Odd-looking wound with what looks to be surface epithelialization but a multitude of  small petechial openings. This clearly not closed yet. We have been using silver alginate under 3 layer compression with TCA 2/19; the wound area did not look quite as good this week. Necrotic debris over the majority of the wound surface which required debridement. She continues to have a multitude of what looked to be small petechial openings. She reminds [place] that she had a biopsy on this initially during her first outbreak in 2015 in [place] [facility]. She expresses concern about this being a possible melanoma. 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 I assured her that this does not look anything like melanoma. Except for the petechial reaction it does look like a venous insufficiency area and she certainly has evidence of this on both sides 2/26; a difficult area on the left medial ankle. The patient clearly has chronic venous hypertension with some degree of lymphedema. The odd thing about the area is the small petechial hemorrhages. I am not  really sure how to explain this. This was present last time and this is not a compression injury. We have been using Hydrofera Blue which I changed to last week 3/4; still using Hydrofera Blue. Aggressive debridement today. She does not have known arterial issues. She has seen Dr. Kellie Simmering at [facility] vein [facility] vascular and and has an ablation on the left. [Anterior accessory branch of the greater saphenous]. From what I remember they did not feel she had an arterial issue. The patient has had this area biopsied in 2009 at [facility] dermatology and by her recollection they said this was "stasis". She is also follow-up with dermatology locally who thought that this was more of a vascular issue 3/11; using Hydrofera Blue. Aggressive debridement today. She does not have an arterial issue. We are using 3 layer compression although we may need to go to 4. The patient has been in for multiple changes to her wrap since I last saw her a week ago. She says that the area was leaking. I do not have too much more information on what was found 01/19/19 on evaluation today patient was actually being seen for a nurse visit when unfortunately she had the area on her left lateral lower extremity as well as weeping from the right lower extremity that became apparent. Therefore we did end up actually seeing her for a full visit with myself. She is having some pain at this site as well but fortunately nothing too significant at this point. No fevers, chills, nausea, or vomiting noted at this time. 3/18-Patient is back to the clinic with the left leg venous leg ulcer, the ulcer is larger in size, has a surface that is densely adherent with fibrinous tissue, the Hydrofera Blue was used but is densely adherent and there was difficulty in removing it. The right lower extremity was also wrapped for weeping edema. Patient has a new area over the left lateral foot above the malleolus that is small and appears to have no debris  with intact surrounding skin. Patient is on increased dose of Lasix also as a means to edema management 3/25; the patient has a nonhealing venous ulcer on the medial left leg and last week developed a smaller area on the lateral left calf. We have been using Hydrofera Blue with a contact layer. 4/1; no major change in these wounds areas. Left medial and more recently left lateral calf. I tried Iodoflex last week to aid in debridement  she did not tolerate this. She stated her pain was terrible all week. She took the top layer of the 4 layer compression off. 4/8; the patient actually looks somewhat better in terms of her more prominent left lateral calf wound. There is some healthy looking tissue here. She is still complaining of a lot of discomfort. 4/15; patient in a lot of pain secondary to sciatica. She is on a prednisone taper prescribed by her primary physician. She has the 2 areas one on the left medial and more recently a smaller area on the left lateral calf. Both of these just above the malleoli 4/22; her back pain is better but she still states she is very uncomfortable and now feels she is intolerant to the [employer]. No real change in the wounds we have been using Sorbact. She has been previously intolerant to Iodoflex. There is not a lot of option about what we can use to debride this wound under compression that she no doubt needs. sHe states Ultram no longer works for her pain 4/29; no major change in the wounds slightly increased depth. Surface on the original medial wound perhaps somewhat improved however the more recent area on the lateral left ankle is 100% covered in very adherent debris we have been using Sorbact. She tolerates 4 layer compression well and her edema control is a lot better. She has not had to come in for a nurse check 5/6; no major change in the condition of the wounds. She did consent to debridement today which was done with some difficulty. Continuing Sorbact.  She did not tolerate Iodoflex. She was in for a check of her compression the day after we wrapped her last week this was adjusted but nothing much was found 5/13; no major change in the condition or area of the wounds. I was able to get a fairly aggressive debridement done on the lateral left leg wound. Even using Sorbact under compression. She came back in on Friday to have the wrap changed. She says she felt uncomfortable on the [name], [name]. ([id number]) lateral aspect of her ankle. She has a long history of chronic venous insufficiency including previous ablation surgery on this side. 5/20-Patient returns for wounds on left leg with both wounds covered in slough, with the lateral leg wound larger in size, she has been in 3 layer compression and felt more comfortable, she describes pain in ankle, in leg and pins and needles in foot, and is about to try Pamelor for this 6/3; wounds on the left lateral and left medial leg. The area medially which is the most recent of the 2 seems to have had the largest increase in dimensions. We have been using Sorbac to try and debride the surface. She has been to see orthopedics they apparently did a plain x-ray that was indeterminant. Diagnosed her with neuropathy and they have ordered an MRI to determine if there is underlying osteomyelitis. This was not high on my thought list but I suppose it is prudent. We have advised her to make an appointment with vein and vascular in [place]. She has a history of a left greater saphenous and accessory vein ablations I wonder if there is anything else that can be done from a surgical point of view to help in these difficult refractory wounds. We have previously healed this wound on one occasion but it keeps on reopening [medial side] 6/10; deep tissue culture I did last week I think on the left medial wound showed  both moderate E. coli and moderate staph aureus [MSSA]. She is going to require antibiotics and I have  chosen Augmentin. We have been using Sorbact and we have made better looking wound surface on both sides but certainly no improvement in wound area. She was back in last Friday apparently for a dressing changes the wrap was hurting her outer left ankle. She has not managed to get a hold of vein and vascular in [place]. We are going to have to make her that appointment 6/17; patient is tolerating the Augmentin. She had an MRI that I think was ordered by orthopedic surgeon this did not show osteomyelitis or an abscess did suggest cellulitis. We have been using Sorbact to the lateral and medial ankles. We have been trying to arrange a follow-up appointment with vein and vascular in [place] or did her original ablations. We apparently an area sent the request to vein and vascular in [facility] 6/24; patient has completed the Augmentin. We do not yet have a vein and vascular appointment in [place]. I am not sure what the issue is here we have asked her to call tomorrow. We are using Sorbact. Making some improvements and especially the medial wound. Both surfaces however look better medial and lateral. 7/1; the patient has been in contact with vein and vascular in [place] but has not yet received an appointment. Using Sorbact we have gradually improve the wound surface with no improvement in surface area. She is approved for Apligraf but the wound surface still is not completely viable. She has not had to come in for a dressing change 7/8; the patient has an appointment with vein and vascular on 7/31 which is a Friday afternoon. She is concerned about getting back here for [place] to dress her wounds. I think it is important to have them goal for her venous reflux/history of ablations etc. to see if anything else can be done. She apparently tested positive for 1 of the blood tests with regards to lupus and saw a rheumatologist. He has raised the issue of vasculitis again. I have had this thought in  the past however the evidence seems overwhelming that this is a venous reflux etiology. If the rheumatologist tells me there is clinical and laboratory investigation is positive for lupus I will rethink this. 7/15; the patient's wound surfaces are quite a bit better. The medial area which was her original wound now has no depth although the lateral wound which was the more recent area actually appears larger. Both with viable surfaces which is indeed better. Using Sorbact. I wanted to use Apligraf on her however there is the issue of the vein and vascular appointment on 7/31 at 2:00 in the afternoon which would not allow her to get back to be rewrapped and they would no doubt remove the graft 7/22; the patient's wound surfaces have moderate amount of debris although generally look better. The lateral one is larger with 2 small satellite areas superiorly. We are waiting for her vein and vascular appointment on 7/31. She has been approved for Apligraf which I would like to use after th 7/29; wound surfaces have improved no debridement is required we have been using Sorbact. She sees vein and vascular on Friday with this so question of whether anything can be done to lessen the likelihood of recurrence and/or speed the healing of these areas. She is already had previous ablations. She no doubt has severe venous hypertension 8/5-Patient returns at 1 week, she was in [employer]  for 3 days by her podiatrist, we have been using so backed to the wound, she has increased pain in both the wounds on the left lower leg especially the more distal one on the lateral aspect 8/12-Patient returns at 1 week and she is agreeable to having debridement in both wounds on her left leg today. We have been using Sorbact, and vascular studies were reviewed at last visit 8/19; the patient arrives with her wounds fairly clean and no debridement is required. We have used Sorbact which is really done a nice job in cleaning up  these very difficult wound surfaces. The patient saw Dr. Donzetta Matters of vascular surgery on 7/31. He did not feel that there was an arterial component. He felt that her treated greater saphenous vein is adequately addressed and that the small saphenous vein did not appear to be involved significantly. She was also noted to have deep venous reflux which is not treatable. Dr. Donzetta Matters mentioned the possibility of a central obstructive component leading to reflux and he offered her central venography. She wanted to discuss this or think about it. I have urged her to go ahead with this. She has had recurrent difficult wounds in these areas which do heal but after months in the clinic. If there is anything that can be done to reduce the likelihood of this I think it is worth it. 9/2 she is still working towards getting follow-up with Dr. Donzetta Matters to schedule her CT. Things are quite a bit worse venography. I put Apligraf on 2 weeks ago on both wounds on the medial and lateral part of her left lower leg. She arrives in clinic today with 3 superficial additional wounds above the area laterally and one below the wound medially. She describes a lot of discomfort. I think these are probably wrapped injuries. Does not look like she has cellulitis. 07/20/2019 on evaluation today patient appears to be doing somewhat poorly in regard to her lower extremity ulcers. She in fact showed signs of erythema in fact we may even be dealing with an infection at this time. Unfortunately I am unsure if this is just infection or if indeed there may be some allergic reaction that occurred as a result of the Apligraf application. With that being said that would be unusual but nonetheless not impossible in this patient is one who is unfortunately allergic to quite a bit. Currently we have been using the Sorbact which seems to do as well as anything for her. I do think we may want to obtain a culture today to see if there is anything showing up  there that may need to be addressed. 9/16; noted that last week the wounds look worse in 1 week follow-up of the Apligraf. Using Sorbact as of 2 days ago. She arrives with copious amounts of drainage and new skin breakdown on the back of the left calf. The wounds arm more substantial bilaterally. There is a fair amount of swelling in the left calf no overt DVT there is edema present I think in the left greater than right thigh. She is supposed to go on 9/28 for CT venography. The wounds on the medial and lateral calf are worse and she has new skin breakdown posteriorly at least new for me. This is almost developing into a circumferential wound area The Apligraf was taken off last week which I agree with things are not going in the right direction a culture was done we do not have that back yet. She is  on Augmentin that she started 2 days ago 9/23; dressing was changed by her nurses on Monday. In general there is no improvement in the wound areas although the area looks less angry than last week. She did get Augmentin for MSSA cultured on the 14th. She still appears to have too much swelling in the left leg even with 3 layer compression 9/30; the patient underwent her procedure on 9/28 by Dr. Donzetta Matters at [facility]. She was discovered to have the common iliac vein measuring 12.2 mm but at the level of L4-L5 measured 3 mm. After stenting it measured 10 mm. 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 Rouleaux flow in the common femoral and femoral vein was observed much improved after stenting. We are using silver alginate to the wounds on the medial and lateral ankle on the left. 4 layer compression 10/7; the patient had fluid swelling around her knee and 4 layer compression. At the advice of vein and vascular this was reduced to 3 layer which she is tolerating better. We have been using silver alginate under 3 layer compression since last Friday 10/14; arrives with the  areas on the left ankle looking a lot better. Inflammation in the area also a lot better. She came in for a nurse check on [name], [name] ([id number]) 10/9 10/21; continued nice improvement. Slight improvements in surface area of both the medial and lateral wounds on the left. A lot of the satellite lesions in the weeping erythema around these from stasis dermatitis is resolved. We have been using silver alginate 10/28; general improvement in the entire wound areas although not a lot of change in dimensions the wound certainly looks better. There is a lot less in terms of venous inflammation. Continue silver alginate this week however look towards Hydrofera Blue next week 11/4; very adherent debris on the medial wound left wound is not as bad. We have been using silver alginate. Change to [facility] today 11/11; very adherent debris on both wound areas. She went to vein and vascular last week and follow-up they put in [place] boot on this today. He says the [facility] was adherent. Wound is definitely not as good as last week. Especially on the left there the satellite lesions look more prominent 11/18; absolutely no better. erythema on lateral aspect with tenderness. 09/30/2019 on evaluation today patient appears to actually be doing better. Dr. Dellia Nims did put her on doxycycline last week which I do believe has helped her at this point. Fortunately there is no signs of active infection at this time. No fevers, chills, nausea, vomiting, or diarrhea. I do believe he may want extend the doxycycline for 7 additional days just to ensure everything does completely cleared up the patient is in agreement with that plan. Otherwise she is going require some sharp debridement today 12/2; patient is completing a 2-week course of doxycycline. I gave her this empirically for inflammation as well as infection when I last saw her 2 weeks ago. All of this seems to be better. She is using silver alginate she  has the area on the medial aspect of the larger area laterally and the 2 small satellite regions laterally above the major wound. 12/9; the patient's wound on the left medial and left lateral calf look really quite good. We have been using silver alginate. She saw vein and vascular in follow-up on 10/09/2019. She has had a previous left greater saphenous vein ablation by Dr. Oscar La in 2016.  More recently she underwent a left common iliac vein stent by Dr. 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 The swelling is improved and certainly the wounds have improved. The patient shows [place] today area on the right medial calf there is almost no wound but leaking lymphedema. She says she start this started 3 or 4 days ago. She did not traumatize it. It is not painful. She does not wear compression on that side 12/16; the patient continues to do well laterally. Medially still requiring debridement. The area on the right calf did not materialize to anything and is not currently open. We wrapped this last time. She has support stockings for that leg although I am not sure they are going to provide adequate compression 12/23; the lateral wound looks stable. Medially still requiring debridement for tightly adherent fibrinous debris. We've been using silver alginate. Surface area not any different 12/30; neither wound is any better with regards to surface and the area on the left lateral is larger. I been using silver alginate to the left lateral which look quite good last week and Sorbact to the left medial 11/11/2019. Lateral wound area actually looks better and somewhat smaller. Medial still requires a very aggressive debridement today. We have been using Sorbact on both wound areas 1/13; not much better still adherent debris bilaterally. I been using Sorbact. She has severe venous hypertension. Probably some degree of dermal fibrosis distally. I wonder whether tighter compression might help and I am going  to try that today. We also need to work on the bioburden 1/20; using Sorbact. She has severe venous hypertension status post stent placement for pelvic vein compression. We applied gentamicin last time to see if we could reduce bioburden I had some discussion with her today about the use of pentoxifylline. This is occasionally used in this setting for wounds with refractory venous insufficiency. However this interacts with Plavix. She tells me that she was put on this after stent placement for 3 months. She will call Dr. Claretha Cooper office to discuss 1/27; we are using gentamicin under Sorbact. 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 She has a stent. Wound medially is measuring smaller this week. Laterally measuring slightly larger although she has some satellite lesions superiorly 2/3; gentamicin under Sorbact under 4-layer compression. 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 She has a stent on Plavix. Her wounds are measuring smaller this week. More substantially laterally where there is a satellite lesion superiorly. 2/10; gentamicin under Sorbac. 4-layer compression. Patient communicated with Dr. Donzetta Matters at vein and vascular in [place]. He is okay with the patient coming off Plavix I will therefore start her on pentoxifylline for a 1 month trial. In general her wounds look better today. I had some concerns about swelling in the left thigh however she measures 61.5 on the right and 63 on the mid thigh which does not suggest there is any difficulty. The patient is not describing any pain. 2/17; gentamicin under Sorbac 4-layer compression. She has been on pentoxifylline for 1 week and complains of loose stool. No nausea she is eating and drinking well 2/24; the patient apparently came in 2 days ago for a nurse visit when her wrap fell down. Both areas look a little worse this week macerated medially and satellite lesions laterally. Change to  silver alginate today 3/3; wounds are larger today especially medially. She also has more swelling in her foot lower leg and I even noted some swelling in  her posterior thigh which is tender. I wonder about the patency of her stent. Fortuitously she sees Dr. Claretha Cooper group on Friday 3/10; Mrs. Mastrogiovanni was seen by vein and vascular on 3/5. The patient underwent ultrasound. There was no evidence of thrombosis involving the IVC no evidence of thrombosis involving the right common iliac vein there is no evidence of thrombosis involving the right external iliac vein the left external vein is also patent. The right common iliac vein stent appears patent bilateral common femoral veins are compressible and appear patent. I was concerned about the left common iliac stent however it looks like this is functional. She has some edema in the posterior thigh that was tender she still has that this week. I also note they had trouble finding the pulses in her left foot and booked her for an ABI baseline in 4 weeks. She will follow up in 6 months for repeat IVC duplex. The patient stopped the pentoxifylline because of diarrhea. It does not look like that was being effective in any case. I have advised her to go back on her aspirin 81 mg tablet, vascular it also suggested this 3/17; comes in today with her wound surfaces a lot better. The excoriations from last week considerably better probably secondary to the TCA. We have been using silver alginate 3/24; comes in today with smaller wounds both medially and laterally. Both required debridement. There are 2 small satellite areas superiorly laterally. She also has a very odd bandlike area in the mid calf almost looking like there was a weakness in the wrap in a localized area. I would write this off as being this however anteriorly she has a small raised ballotable area that is very tender almost reminiscent of an abscess but there was no obvious purulent surface to  it. 02/04/20 upon evaluation today patient appears to be doing fairly well in regard to her wounds today. Fortunately there is no signs of active [name], OMAHONEY. ([id number]) infection at this time. No fevers, chills, nausea, vomiting, or diarrhea. She has been tolerating the dressing changes without complication. Fortunately I feel like she is showing signs of improvement although has been sometime since have seen her. Nonetheless the area of concern that Dr. Dellia Nims had last week where she had possibly an area of the wrap that was we can allow the leg to bulge appears to be doing significantly better today there is no signs of anything worsening. 4/7; the patient's wounds on her medial and lateral left leg continue to contract. We have been using a regular alginate. Last week she developed an area on the right medial lower leg which is probably a venous ulcer as well. 4/14; the wounds on her left medial and lateral lower leg continue to contract. Surface eschar. We have been using regular alginate. The area on the right medial lower leg is closed. We have been putting both legs under 4-layer contraction. The patient went back to see vein and vascular she had arterial studies done which were apparently "quite good" per the patient although I have not read their notes I have never felt she had an arterial issue. 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 This is been longstanding and refractory to exercise, leg elevation and longstanding use of compression wraps in our clinic as well as compression stockings on the times we have been able to get these to heal 4/21; we thought she actually might be close this week however she arrives in  clinic with a lot of edema in her upper left calf and into her posterior thigh. This is been an intermittent problem here. She says the wrap fell down but it was replaced with a nurse visit on Monday. We are using calcium  alginate to the wounds and the wound sizes there not terribly larger than last week but there is a lot more edema 4/28; again wound edges are smaller on both sides. Her edema is better controlled than last time. She is obtained her compression pumps from [employer] although they have not been to her home to set these up. 5/5; left medial and left lateral both look stable. I am not sure the medial is any smaller. We have been using calcium alginate under 4-layer compression. oShe had an area on the right medial. This was eschared today. We have been wrapping this as well. She does not tolerate external compression stockings due to a history of various contact allergies. She has her compression pumps however the representative from the company is coming on her to show her how to use these tomorrow 5/19; patient with severe chronic venous insufficiency secondary to central venous disease. She had a stent placed in her left common iliac vein. She has done better since but still difficult to control wounds. She comes in today with nothing open on the right leg. Her areas on the left medial and left lateral are just about closed. We are using calcium alginate under 4-layer compression. She is using her external compression pumps at home She only has 15-20 support stockings. States she cannot get anything tighter than that on. 03/30/20-Patient returns at 1 week, the wounds on the left leg are both slightly bigger, the last week she was on 3 layer compression which started to slide down. She is starting to use her lymphedema pumps although she stated on 1 day her right ankle started to swell up and she have to stop that day. Unfortunately the open area seem to oscillate between improving to the point of healing and then flaring up all to do with effectiveness of compression or lack of due to the left leg topography not keeping the compression wraps from rolling down 6/2 patient comes in with a 15/20  mmHg stocking on the right leg. She tells me that she developed a lot of swelling in her ankles she saw orthopedics she was felt to possibly be having a flare of pseudogout versus some other type of arthritis. She was put on steroids for a respiratory issue so that helps with the inflammation. She has not been using the pumps all week. She thinks the left thigh is more swollen than usual and I would agree with that. She has an appointment with Dr. Donzetta Matters 9 days or so from now 6/9; both wounds on the left medial and left lateral are smaller. We have been using calcium alginate under compression. She does not have an open wound on the right leg she is using a stocking and her compression pumps things are going well. She has an appointment with Dr. Donzetta Matters with regards to her stent in the left common iliac vein 6/16; the wounds on the left medial and left lateral ankle continues to contract. The patient saw Dr. Donzetta Matters and I think he seems satisfied. Ordered follow-up venous reflux studies on both sides in September. Cautioned that she may need thigh-high stockings. She has been using calcium alginate under compression on the left and her own stocking on the right leg.  She tells [place] there are no open wounds on the right 6/23; left lateral is just about closed. Medial required debridement today. We have been using calcium alginate. Extensive discussion about the compression pumps she is only using these on 25 mmHg states she could not take 40 or 30 when the wrap came out to her home to demonstrate these. He said they should not feel tight 6/30; the left lateral wound has a slight amount of eschar. . The area medially is about the same using Hydrofera Blue. 7/7; left lateral wound still has some eschar. I will remove this next week may be closed. The area medially is very small using Hydrofera Blue with improvement. Unfortunately the stockings fell down. Unfortunately the blisters have developed at the edge of  where the wrap fell. When this happened she says her legs hurt she did not use her pumps. We are not open Monday for her to come in and change the wraps and she had an appointment yesterday. She also tells me that she is going to have an MRI of her back. She is having pain radiating into her left anterior leg she thinks her from an L5 disc. She saw Dr. Ellene Route of neurosurgery 7/14; the area on the left lateral ankle area is closed. Still a small area medially however it looks better as well. We have been using Hydrofera Blue under 4-layer compression 7/21; left lateral ankle is still closed however her wound on the medial left calf is actually larger. This is probably because Hydrofera Blue got stuck to the wound. She came in for a nurse change on Friday and will do that again this week I was concerned about the amount of swelling that she had last week however she is using her compression pumps twice a day and the swelling seems well controlled 7/28; remaining wound on the left medial lower leg is smaller. We have been using moistened silver collagen under compression she is coming back for a nurse visit. For reasons that were not really clear she was just keeping her legs elevated and not using her compression pumps. I have asked her to use the compression pumps. She does not have any wounds on the right leg 06/15/20-Patient returns at 2 weeks, her LLE edema is worse and she developed a blister wound that is new and has bigger posterior calf wound on right, we are using Prisma with pad, 4 layer compression. she has been on lasix 40 mg daily 8/18; patient arrives today with things a lot worse than I remember from a few weeks ago. She was seen last week. Noted that her edema was worse and that she now had a left lateral wound as well as deteriorating edema in the medial and posterior part of the lower leg. She says she is using his or her external compression pumps once a day although I wonder about  the compliance. 8/25; weeping area on the right medial lower leg. This had actually gotten a small localized area of her compression stocking wet. oOn the left side there is a large denuded area on the posterior medial lower leg and smaller area on the lateral. This was not the original areas that we dealt with. 9/1 the patient's wound on the left leg include the left lateral and left posterior. Larger superficial wounds weeping. She has very poor edema control. Tender localized edema in the left lower medial ankle/heel probably because of localized wrap issues. She freely admits she is not using the  compression pumps. She has been up on her feet a lot. She thinks the hydrofera blue is contributing to the pain she is experiencing.. This is a complaint that I have occasionally heard MONIKA, CHESTANG ([id number]) Electronic Signature(s) Signed: 07/07/2020 9:14:54 AM By: Linton Ham MD Entered By: Linton Ham on 07/06/2020 13:22:34 [name], [name] ([id number]) -------------------------------------------------------------------------------- Physical Exam Details Patient Name: NIKIA, LEVELS. Date of Service: 07/06/2020 12:30 PM Medical Record Number: [id number] Patient Account Number: [id number] Date of Birth/Sex: [birth year] (57 y.[name]. F) Treating RN: Cornell Barman Primary Care Provider: [name] Other Clinician: Referring Provider: [name] Treating Provider/Extender: Tito Dine in Treatment: 57 Constitutional Patient is hypertensive.. Pulse regular and within target range for patient.Marland Kitchen Respirations regular, non-labored and within target range.. Temperature is normal and within the target range for the patient.Marland Kitchen appears in no distress. Cardiovascular Pedal pulsespedal pulses palpable in both feet. Reasonably poor edema control bilaterally.. Notes Wound exam absolutely no improvement in the left leg. Wounds are weeping and superficial. I think the area more  posteriorly is worse than last time. She has very poor edema control bilaterally. There is however nothing open on the right leg Electronic Signature(s) Signed: 07/07/2020 9:14:54 AM By: Linton Ham MD Entered By: Linton Ham on 07/06/2020 13:24:35 [name], [name] ([id number]) -------------------------------------------------------------------------------- Physician Orders Details Patient Name: [name], [name]. Date of Service: 07/06/2020 12:30 PM Medical Record Number: [id number] Patient Account Number: [id number] Date of Birth/Sex: 01-01-1946 (66 y.[name]. F) Treating RN: Cornell Barman Primary Care Provider: [name] Other Clinician: Referring Provider: [name] Treating Provider/Extender: Tito Dine in Treatment: 82 Verbal / Phone Orders: No Diagnosis Coding Anesthetic (add to Medication List) Wound #12 Left,Lateral Lower Leg [name] Topical Lidocaine 4% cream applied to wound bed prior to debridement (In Clinic Only). Wound #5 Left,Medial Lower Leg [name] Topical Lidocaine 4% cream applied to wound bed prior to debridement (In Clinic Only). Primary Wound Dressing Wound #12 Left,Lateral Lower Leg [name] Silver Alginate Wound #5 Left,Medial Lower Leg [name] Silver Alginate Secondary Dressing Wound #12 Left,Lateral Lower Leg [name] ABD pad Wound #5 Left,Medial Lower Leg [name] ABD pad Dressing Change Frequency Wound #12 Left,Lateral Lower Leg [name] Change dressing every week [name] Other: - Nurse visit Friday and Monday if needed. Wound #5 Left,Medial Lower Leg [name] Change dressing every week [name] Other: - Nurse visit Friday and Monday if needed. Follow-up Appointments Wound #12 Left,Lateral Lower Leg [name] Return Appointment in 1 week. [name] Nurse Visit as needed Wound #5 Left,Medial Lower Leg [name] Return Appointment in 1 week. [name] Nurse Visit as needed Edema Control Wound #12 Left,Lateral Lower Leg [name] 4 Layer Compression System - Bilateral [name] Compression Pump: Use  compression pump on left lower extremity for 60 minutes, twice daily. [name] Compression Pump: Use compression pump on right lower extremity for 60 minutes, twice daily. Wound #5 Left,Medial Lower Leg [name] 4 Layer Compression System - Bilateral [name] Compression Pump: Use compression pump on left lower extremity for 60 minutes, twice daily. [name] Compression Pump: Use compression pump on right lower extremity for 60 minutes, twice daily. Electronic Signature(s) Signed: 07/07/2020 9:14:54 AM By: Linton Ham MD Signed: 07/07/2020 6:11:40 PM By: [name] [name], RN, CWS, [name], [name] [address], [place] ([id number]) Entered By: [name], [name], RN, CWS, Kim on 07/06/2020 13:15:24 [name], [name] ([id number]) -------------------------------------------------------------------------------- Problem List Details Patient Name: MIRELLE, BISKUP. Date of Service: 07/06/2020 12:30 PM Medical Record Number: [id number] Patient Account Number: [id number] Date of Birth/Sex: 04/04/1946 (22 y.[name]. F)  Treating RN: Cornell Barman Primary Care Provider: [name] Other Clinician: Referring Provider: [name] Treating Provider/Extender: Tito Dine in Treatment: 82 Active Problems ICD-10 Encounter Code Description Active Date MDM Diagnosis L97.221 Non-pressure chronic ulcer of left calf limited to breakdown of skin 01/07/2019 No Yes I89.0 Lymphedema, not elsewhere classified 12/10/2018 No Yes I87.332 Chronic venous hypertension (idiopathic) with ulcer and inflammation of 12/09/2019 No Yes left lower extremity I83.009 Varicose veins of unspecified lower extremity with ulcer of unspecified 04/13/2020 No Yes site L97.818 Non-pressure chronic ulcer of other part of right lower leg with other 10/14/2019 No Yes specified severity I82.592 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 Date of Service: 07/06/2020 12:30 PM Medical Record Number: [id number] Patient Account Number: [id number] Date of Birth/Sex: 09/03/1946 (23 y.[name]. F) Treating RN: Cornell Barman Primary Care Provider: [name] Other Clinician: Referring Provider: [name] Treating Provider/Extender: Tito Dine in Treatment: 3 Subjective History of Present Illness (HPI) Pleasant 74 year old with history of chronic venous insufficiency. No diabetes or peripheral vascular disease. Left ABI 1.29. Questionable history of left lower extremity DVT. She developed a recurrent ulceration on her left lateral calf in December 2015, which she attributes to poor diet and subsequent lower extremity edema. She underwent endovenous laser ablation of her left greater saphenous vein in 2010. She underwent laser ablation of accessory branch of left GSV in April 2016 by Dr. Kellie Simmering at [facility]. She was previously wearing Unna boots, which she tolerated well. Tolerating 2 layer compression and cadexomer iodine. She returns to clinic for follow-up and is without new complaints. She denies any significant pain at this time. She reports persistent pain with pressure. No claudication or ischemic rest pain. No fever or chills. No drainage. READMISSION 11/13/16; this is a 74 year old woman who is not a diabetic. She is here for a review of a painful area on her left medial lower extremity. I note that she was seen here previously last year for wound I believe to be in the same area. At that time she had undergone previously a  left greater saphenous vein ablation by Dr. Kellie Simmering and she had a ablation of the anterior accessory branch of the left greater saphenous vein in March 2016. Seeing that the wound actually closed over. In reviewing the history with her today the ulcer in this area has been recurrent. She describes a biopsy of this area in 2009 that only showed stasis physiology. 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 Do not see that she has ever had arterial studies in the left leg. She tells me she has a follow-up with Dr. Kellie Simmering in roughly 10 days In any case she developed the reopening of this area roughly a month ago. On the background of this she describes rapidly increasing edema which has responded to Lasix 40 mg and metolazone 2.5 mg as well as the patient's lymph  massage. She has been told she has both venous insufficiency and lymphedema but she cannot tolerate compression stockings 11/28/16; the patient saw Dr. Kellie Simmering recently. Per the patient he did arterial Dopplers in the office that did not show evidence of arterial insufficiency, per the patient he stated "treat this like an ordinary venous ulcer". She also saw her dermatologist Dr. Ronnald Ramp who felt that this was more of a vascular ulcer. In general things are improving although she arrives today with increasing bilateral lower extremity edema with weeping a deeper fluid through the wound on the left medial leg compatible with some degree of lymphedema 12/04/16; the patient's wound is fully epithelialized but I don't think fully healed. We will do another week of depression with Promogran and TCA however I suspect we'll be able to discharge her next week. This is a very unusual-looking wound which was initially a figure-of-eight type  wound lying on its side surrounded by petechial like hemorrhage. She has had venous ablation on this side. She apparently does not have an arterial issue per Dr. Kellie Simmering. She saw her dermatologist thought it was "vascular". Patient is definitely going to need ongoing compression and I talked about this with her today she will go to elastic therapy after she leaves here next week 12/11/16; the patient's wound is not completely closed today. She has surrounding scar tissue and in further discussion with the patient it would appear that she had ulcers in this area in 2009 for a prolonged period of time ultimately requiring a punch biopsy of this area that only showed venous insufficiency. I did not previously pickup on this part of the history from the patient. 12/18/16; the patient's wound is completely epithelialized. There is no open area here. She has significant bilateral venous insufficiency with secondary lymphedema to a mild-to-moderate degree she does not have compression stockings.. She did not say anything to me when I was in the room, she told our intake nurse that she was still having pain in this area. This isn't unusual recurrent small open area. She is going to go to elastic therapy to obtain compression stockings. 12/25/16; the patient's wound is fully epithelialized. There is no open area here. The patient describes some continued episodic discomfort in this area medial left calf. However everything looks fine and healed here. She is been to elastic therapy and caught herself 15-20 mmHg stockings, they apparently were having trouble getting 20-30 mm stockings in her size 01/22/17; this is a patient we discharged from the clinic a month ago. She has a recurrent open wound on her medial left calf. She had 15 mm support stockings. I told her I thought she needed 20-30 mm compression stockings. She tells me that she has been ill with hospitalization secondary to asthma and is been found to have  severe hypokalemia likely secondary to a combination of Lasix and metolazone. This morning she noted blistering and leaking fluid on the posterior part of her left leg. She called our intake nurse urgently and we was saw her this afternoon. She has not had any real discomfort here. I don't know that she's been wearing any stockings on this leg for at least 2-3 days. ABIs in this clinic were 1.21 on the right and 1.3 on the left. She is previously seen vascular surgery who does not think that there is a peripheral arterial issue. 01/30/17; Patient arrives with no open wound on the left leg. She has been to elastic therapy and obtained 20-16mmhg below knee stockings  and she has one on the right leg today. READMISSION 02/19/18; this Kueker is a now 74 year old patient we've had in this clinic perhaps 3 times before. I had last looked at her from January 07 December 2016 with an area on the medial left leg. We discharged her on 12/25/16 however she had to be readmitted on 01/22/17 with a recurrence. I have in my notes that we discharged her on 20-30 mm stockings although she tells me she was only wearing support hose because she cannot get stockings on predominantly related to her cervical spine surgery/issues. She has had previous ablations done by vein and vascular in [place] including a great saphenous vein ablation on the left with an anterior accessory branch ablation I think both of these were in 2016. On one of the previous visit she had a biopsy noted 2009 that was negative. She is not felt to have an arterial issue. She is not a diabetic. She does have a history of obstructive sleep apnea hypertension asthma as well as chronic venous insufficiency and lymphedema. On this occasion she noted 2 dry scaly patch on her left leg. She tried to put lotion on this it didn't really help. There were 2 open areas.the patient has been seeing her primary physician from 02/05/18 through 02/14/18. She had Unna boots  applied. The superior wound now on the lateral left leg has closed but she's had one wound that remains open on the lateral left leg. This is not the same spot as we dealt with in 2018. ABIs in this clinic were 1.3 bilaterally JILLIANA, BURKES ([id number]) 02/26/18; patient has a small wound on the left lateral calf. Dimensions are down. She has chronic venous insufficiency and lymphedema. 03/05/18; small open area on the left lateral calf. Dimensions are down. Tightly adherent necrotic debris over the surface of the wound which was difficult to remove. Also the dressing [over collagen] stuck to the wound surface. This was removed with some difficulty as well. Change the primary dressing to Hydrofera Blue ready 03/12/18; small open area on the left lateral calf. Comes in with tightly adherent surface eschar as well as some adherent Hydrofera Blue. 03/19/18; open area on the left lateral calf. Again adherent surface eschar as well as some adherent Hydrofera Blue nonviable subcutaneous tissue. She complained of pain all week even with the reduction from 4-3 layer compression I put on last week. Also she had an increase in her ankle and calf measurements probably related to the same thing. 03/26/18; open area on the left lateral calf. A very small open area remains here. We used silver alginate starting last week as the Hydrofera Blue seem to stick to the wound bed. In using 4-layer compression 04/02/18; the open area in the left lateral calf at some adherent slough which I removed there is no open area here. We are able to transition her into her own compression stocking. Truthfully I think this is probably his support hose. However this does not maintain skin integrity will be limited. She cannot put over the toe compression stockings on because of neck problems hand problems etc. She is allergic to the lining layer of juxta lites. We might be forced to use extremitease stocking should this  fail READMIT 11/24/2018 Patient is now a 74 year old woman who is not a diabetic. She has been in this clinic on at least 3 previous occasions largely with recurrent wounds on her left leg secondary to chronic venous insufficiency with secondary lymphedema. Her situation is  complicated by inability to get stockings on and an allergy to neoprene which is apparently a component and at least juxta lites and other stockings. As a result she really has not been wearing any stockings on her legs. She tells [place] that roughly 2 or 3 weeks ago she started noticing a stinging sensation just above her ankle on the left medial aspect. She has been diagnosed with pseudogout and she wondered whether this was what she was experiencing. She tried to dress this with something she bought at the store however subsequently it pulled skin off and now she has an open wound that is not improving. She has been using Vaseline gauze with a cover bandage. She saw her primary doctor last week who put an Haematologist on her. ABIs in this clinic was 1.03 on the left 2/12; the area is on the left medial ankle. Odd-looking wound with what looks to be surface epithelialization but a multitude of small petechial openings. This clearly not closed yet. We have been using silver alginate under 3 layer compression with TCA 2/19; the wound area did not look quite as good this week. Necrotic debris over the majority of the wound surface which required debridement. She continues to have a multitude of what looked to be small petechial openings. She reminds [place] that she had a biopsy on this initially during her first outbreak in 2015 in [place] dermatology. She expresses concern about this being a possible melanoma. 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 I assured her that this does not look anything like melanoma. Except for the petechial reaction it  does look like a venous insufficiency area and she certainly has evidence of this on both sides 2/26; a difficult area on the left medial ankle. The patient clearly has chronic venous hypertension with some degree of lymphedema. The odd thing about the area is the small petechial hemorrhages. I am not really sure how to explain this. This was present last time and this is not a compression injury. We have been using Hydrofera Blue which I changed to last week 3/4; still using Hydrofera Blue. Aggressive debridement today. She does not have known arterial issues. She has seen Dr. Kellie Simmering at [facility] vein and vascular and and has an ablation on the left. [Anterior accessory branch of the greater saphenous]. From what I remember they did not feel she had an arterial issue. The patient has had this area biopsied in 2009 at [facility] dermatology and by her recollection they said this was "stasis". She is also follow-up with dermatology locally who thought that this was more of a vascular issue 3/11; using Hydrofera Blue. Aggressive debridement today. She does not have an arterial issue. We are using 3 layer compression although we may need to go to 4. The patient has been in for multiple changes to her wrap since I last saw her a week ago. She says that the area was leaking. I do not have too much more information on what was found 01/19/19 on evaluation today patient was actually being seen for a nurse visit when unfortunately she had the area on her left lateral lower extremity as well as weeping from the right lower extremity that became apparent. Therefore we did end up actually seeing her for a full visit with myself. She is having some pain at this site as well but fortunately nothing too significant at this point. No fevers,  chills, nausea, or vomiting noted at this time. 3/18-Patient is back to the clinic with the left leg venous leg ulcer, the ulcer is larger in size, has a surface that is densely  adherent with fibrinous tissue, the Hydrofera Blue was used but is densely adherent and there was difficulty in removing it. The right lower extremity was also wrapped for weeping edema. Patient has a new area over the left lateral foot above the malleolus that is small and appears to have no debris with intact surrounding skin. Patient is on increased dose of Lasix also as a means to edema management 3/25; the patient has a nonhealing venous ulcer on the medial left leg and last week developed a smaller area on the lateral left calf. We have been using Hydrofera Blue with a contact layer. 4/1; no major change in these wounds areas. Left medial and more recently left lateral calf. I tried Iodoflex last week to aid in debridement she did not tolerate this. She stated her pain was terrible all week. She took the top layer of the 4 layer compression off. 4/8; the patient actually looks somewhat better in terms of her more prominent left lateral calf wound. There is some healthy looking tissue here. She is still complaining of a lot of discomfort. 4/15; patient in a lot of pain secondary to sciatica. She is on a prednisone taper prescribed by her primary physician. She has the 2 areas one on the left medial and more recently a smaller area on the left lateral calf. Both of these just above the malleoli 4/22; her back pain is better but she still states she is very uncomfortable and now feels she is intolerant to the [employer]. No real change in the wounds we have been using Sorbact. She has been previously intolerant to Iodoflex. There is not a lot of option about what we can use to debride this wound under compression that she no doubt needs. sHe states Ultram no longer works for her pain 4/29; no major change in the wounds slightly increased depth. Surface on the original medial wound perhaps somewhat improved however the more recent area on the lateral left ankle is 100% covered in very adherent  debris we have been using Sorbact. She tolerates 4 layer compression well and her edema control is a lot better. She has not had to come in for a nurse check 5/6; no major change in the condition of the wounds. She did consent to debridement today which was done with some difficulty. Continuing Sorbact. She did not tolerate Iodoflex. She was in for a check of her compression the day after we wrapped her last week this was adjusted but nothing much was found 5/13; no major change in the condition or area of the wounds. I was able to get a fairly aggressive debridement done on the lateral left leg wound. Even using Sorbact under compression. She came back in on Friday to have the wrap changed. She says she felt uncomfortable on the lateral aspect of her ankle. She has a long history of chronic venous insufficiency including previous ablation surgery on this side. 5/20-Patient returns for wounds on left leg with both wounds covered in slough, with the lateral leg wound larger in size, she has been in 3 layer compression and felt more comfortable, she describes pain in ankle, in leg and pins and needles in foot, and is about to try Pamelor for this 6/3; wounds on the left lateral and left medial leg.  The area medially which is the most recent of the 2 seems to have had the largest increase in [place], ENSLEY BLAS. ([id number]) dimensions. We have been using Sorbac to try and debride the surface. She has been to see orthopedics they apparently did a plain x-ray that was indeterminant. Diagnosed her with neuropathy and they have ordered an MRI to determine if there is underlying osteomyelitis. This was not high on my thought list but I suppose it is prudent. We have advised her to make an appointment with vein and vascular in [place]. She has a history of a left greater saphenous and accessory vein ablations I wonder if there is anything else that can be done from a surgical point of view to help in these  difficult refractory wounds. We have previously healed this wound on one occasion but it keeps on reopening [medial side] 6/10; deep tissue culture I did last week I think on the left medial wound showed both moderate E. coli and moderate staph aureus [MSSA]. She is going to require antibiotics and I have chosen Augmentin. We have been using Sorbact and we have made better looking wound surface on both sides but certainly no improvement in wound area. She was back in last Friday apparently for a dressing changes the wrap was hurting her outer left ankle. She has not managed to get a hold of vein and vascular in [place]. We are going to have to make her that appointment 6/17; patient is tolerating the Augmentin. She had an MRI that I think was ordered by orthopedic surgeon this did not show osteomyelitis or an abscess did suggest cellulitis. We have been using Sorbact to the lateral and medial ankles. We have been trying to arrange a follow-up appointment with vein and vascular in [place] or did her original ablations. We apparently an area sent the request to vein and vascular in [facility] 6/24; patient has completed the Augmentin. We do not yet have a vein and vascular appointment in [place]. I am not sure what the issue is here we have asked her to call tomorrow. We are using Sorbact. Making some improvements and especially the medial wound. Both surfaces however look better medial and lateral. 7/1; the patient has been in contact with vein and vascular in [place] but has not yet received an appointment. Using Sorbact we have gradually improve the wound surface with no improvement in surface area. She is approved for Apligraf but the wound surface still is not completely viable. She has not had to come in for a dressing change 7/8; the patient has an appointment with vein and vascular on 7/31 which is a Friday afternoon. She is concerned about getting back here for [place] to dress her  wounds. I think it is important to have them goal for her venous reflux/history of ablations etc. to see if anything else can be done. She apparently tested positive for 1 of the blood tests with regards to lupus and saw a rheumatologist. He has raised the issue of vasculitis again. I have had this thought in the past however the evidence seems overwhelming that this is a venous reflux etiology. If the rheumatologist tells me there is clinical and laboratory investigation is positive for lupus I will rethink this. 7/15; the patient's wound surfaces are quite a bit better. The medial area which was her original wound now has no depth although the lateral wound which was the more recent area actually appears larger. Both with viable surfaces which is indeed  better. Using Sorbact. I wanted to use Apligraf on her however there is the issue of the vein and vascular appointment on 7/31 at 2:00 in the afternoon which would not allow her to get back to be rewrapped and they would no doubt remove the graft 7/22; the patient's wound surfaces have moderate amount of debris although generally look better. The lateral one is larger with 2 small satellite areas superiorly. We are waiting for her vein and vascular appointment on 7/31. She has been approved for Apligraf which I would like to use after th 7/29; wound surfaces have improved no debridement is required we have been using Sorbact. She sees vein and vascular on Friday with this so question of whether anything can be done to lessen the likelihood of recurrence and/or speed the healing of these areas. She is already had previous ablations. She no doubt has severe venous hypertension 8/5-Patient returns at 1 week, she was in [place] for 3 days by her podiatrist, we have been using so backed to the wound, she has increased pain in both the wounds on the left lower leg especially the more distal one on the lateral aspect 8/12-Patient returns at 1 week and  she is agreeable to having debridement in both wounds on her left leg today. We have been using Sorbact, and vascular studies were reviewed at last visit 8/19; the patient arrives with her wounds fairly clean and no debridement is required. We have used Sorbact which is really done a nice job in cleaning up these very difficult wound surfaces. The patient saw Dr. Donzetta Matters of vascular surgery on 7/31. He did not feel that there was an arterial component. He felt that her treated greater saphenous vein is adequately addressed and that the small saphenous vein did not appear to be involved significantly. She was also noted to have deep venous reflux which is not treatable. Dr. Donzetta Matters mentioned the possibility of a central obstructive component leading to reflux and he offered her central venography. She wanted to discuss this or think about it. I have urged her to go ahead with this. She has had recurrent difficult wounds in these areas which do heal but after months in the clinic. If there is anything that can be done to reduce the likelihood of this I think it is worth it. 9/2 she is still working towards getting follow-up with Dr. Donzetta Matters to schedule her CT. Things are quite a bit worse venography. I put Apligraf on 2 weeks ago on both wounds on the medial and lateral part of her left lower leg. She arrives in clinic today with 3 superficial additional wounds above the area laterally and one below the wound medially. She describes a lot of discomfort. I think these are probably wrapped injuries. Does not look like she has cellulitis. 07/20/2019 on evaluation today patient appears to be doing somewhat poorly in regard to her lower extremity ulcers. She in fact showed signs of erythema in fact we may even be dealing with an infection at this time. Unfortunately I am unsure if this is just infection or if indeed there may be some allergic reaction that occurred as a result of the Apligraf application. With that  being said that would be unusual but nonetheless not impossible in this patient is one who is unfortunately allergic to quite a bit. Currently we have been using the Sorbact which seems to do as well as anything for her. I do think we may want to obtain a  culture today to see if there is anything showing up there that may need to be addressed. 9/16; noted that last week the wounds look worse in 1 week follow-up of the Apligraf. Using Sorbact as of 2 days ago. She arrives with copious amounts of drainage and new skin breakdown on the back of the left calf. The wounds arm more substantial bilaterally. There is a fair amount of swelling in the left calf no overt DVT there is edema present I think in the left greater than right thigh. She is supposed to go on 9/28 for CT venography. The wounds on the medial and lateral calf are worse and she has new skin breakdown posteriorly at least new for me. This is almost developing into a circumferential wound area The Apligraf was taken off last week which I agree with things are not going in the right direction a culture was done we do not have that back yet. She is on Augmentin that she started 2 days ago 9/23; dressing was changed by her nurses on Monday. In general there is no improvement in the wound areas although the area looks less angry than last week. She did get Augmentin for MSSA cultured on the 14th. She still appears to have too much swelling in the left leg even with 3 layer compression 9/30; the patient underwent her procedure on 9/28 by Dr. Donzetta Matters at [facility]. She was discovered to have the common iliac vein measuring 12.2 mm but at the level of L4-L5 measured 3 mm. After stenting it measured 10 mm. 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 Rouleaux flow in the common femoral and femoral vein was observed much improved after stenting. We are using silver alginate to the wounds on the medial and lateral ankle on  the left. 4 layer compression 10/7; the patient had fluid swelling around her knee and 4 layer compression. At the advice of vein and vascular this was reduced to 3 layer which she is tolerating better. We have been using silver alginate under 3 layer compression since last Friday 10/14; arrives with the areas on the left ankle looking a lot better. Inflammation in the area also a lot better. She came in for a nurse check on 10/9 10/21; continued nice improvement. Slight improvements in surface area of both the medial and lateral wounds on the left. A lot of the satellite lesions in the weeping erythema around these from stasis dermatitis is resolved. We have been using silver alginate MELAT, WRISLEY ([id number]) 10/28; general improvement in the entire wound areas although not a lot of change in dimensions the wound certainly looks better. There is a lot less in terms of venous inflammation. Continue silver alginate this week however look towards Hydrofera Blue next week 11/4; very adherent debris on the medial wound left wound is not as bad. We have been using silver alginate. Change to [facility] today 11/11; very adherent debris on both wound areas. She went to vein and vascular last week and follow-up they put in [place] boot on this today. He says the [facility] was adherent. Wound is definitely not as good as last week. Especially on the left there the satellite lesions look more prominent 11/18; absolutely no better. erythema on lateral aspect with tenderness. 09/30/2019 on evaluation today patient appears to actually be doing better. Dr. Dellia Nims did put her on doxycycline last week which I do believe has helped her at this point. Fortunately there is  no signs of active infection at this time. No fevers, chills, nausea, vomiting, or diarrhea. I do believe he may want extend the doxycycline for 7 additional days just to ensure everything does completely cleared up the patient is  in agreement with that plan. Otherwise she is going require some sharp debridement today 12/2; patient is completing a 2-week course of doxycycline. I gave her this empirically for inflammation as well as infection when I last saw her 2 weeks ago. All of this seems to be better. She is using silver alginate she has the area on the medial aspect of the larger area laterally and the 2 small satellite regions laterally above the major wound. 12/9; the patient's wound on the left medial and left lateral calf look really quite good. We have been using silver alginate. She saw vein and vascular in follow-up on 10/09/2019. She has had a previous left greater saphenous vein ablation by Dr. Oscar La in 2016. More recently she underwent a left common iliac vein stent by Dr. 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 The swelling is improved and certainly the wounds have improved. The patient shows [place] today area on the right medial calf there is almost no wound but leaking lymphedema. She says she start this started 3 or 4 days ago. She did not traumatize it. It is not painful. She does not wear compression on that side 12/16; the patient continues to do well laterally. Medially still requiring debridement. The area on the right calf did not materialize to anything and is not currently open. We wrapped this last time. She has support stockings for that leg although I am not sure they are going to provide adequate compression 12/23; the lateral wound looks stable. Medially still requiring debridement for tightly adherent fibrinous debris. We've been using silver alginate. Surface area not any different 12/30; neither wound is any better with regards to surface and the area on the left lateral is larger. I been using silver alginate to the left lateral which look quite good last week and Sorbact to the left medial 11/11/2019. Lateral wound area actually looks better and somewhat smaller. Medial still  requires a very aggressive debridement today. We have been using Sorbact on both wound areas 1/13; not much better still adherent debris bilaterally. I been using Sorbact. She has severe venous hypertension. Probably some degree of dermal fibrosis distally. I wonder whether tighter compression might help and I am going to try that today. We also need to work on the bioburden 1/20; using Sorbact. She has severe venous hypertension status post stent placement for pelvic vein compression. We applied gentamicin last time to see if we could reduce bioburden I had some discussion with her today about the use of pentoxifylline. This is occasionally used in this setting for wounds with refractory venous insufficiency. However this interacts with Plavix. She tells me that she was put on this after stent placement for 3 months. She will call Dr. Claretha Cooper office to discuss 1/27; we are using gentamicin under Sorbact. 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 She has a stent. Wound medially is measuring smaller this week. Laterally measuring slightly larger although she has some satellite lesions superiorly 2/3; gentamicin under Sorbact under 4-layer compression. 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 She has a stent on Plavix. Her wounds are measuring smaller this week. More substantially laterally where there is a satellite lesion superiorly. 2/10; gentamicin under Sorbac. 4-layer compression. Patient communicated  with Dr. Donzetta Matters at vein and vascular in [place]. He is okay with the patient coming off Plavix I will therefore start her on pentoxifylline for a 1 month trial. In general her wounds look better today. I had some concerns about swelling in the left thigh however she measures 61.5 on the right and 63 on the mid thigh which does not suggest there is any difficulty. The patient is not describing any pain. 2/17; gentamicin under Sorbac 4-layer  compression. She has been on pentoxifylline for 1 week and complains of loose stool. No nausea she is eating and drinking well 2/24; the patient apparently came in 2 days ago for a nurse visit when her wrap fell down. Both areas look a little worse this week macerated medially and satellite lesions laterally. Change to silver alginate today 3/3; wounds are larger today especially medially. She also has more swelling in her foot lower leg and I even noted some swelling in her posterior thigh which is tender. I wonder about the patency of her stent. Fortuitously she sees Dr. Claretha Cooper group on Friday 3/10; Mrs. Eckersley was seen by vein and vascular on 3/5. The patient underwent ultrasound. There was no evidence of thrombosis involving the IVC no evidence of thrombosis involving the right common iliac vein there is no evidence of thrombosis involving the right external iliac vein the left external vein is also patent. The right common iliac vein stent appears patent bilateral common femoral veins are compressible and appear patent. I was concerned about the left common iliac stent however it looks like this is functional. She has some edema in the posterior thigh that was tender she still has that this week. I also note they had trouble finding the pulses in her left foot and booked her for an ABI baseline in 4 weeks. She will follow up in 6 months for repeat IVC duplex. The patient stopped the pentoxifylline because of diarrhea. It does not look like that was being effective in any case. I have advised her to go back on her aspirin 81 mg tablet, vascular it also suggested this 3/17; comes in today with her wound surfaces a lot better. The excoriations from last week considerably better probably secondary to the TCA. We have been using silver alginate 3/24; comes in today with smaller wounds both medially and laterally. Both required debridement. There are 2 small satellite areas  superiorly laterally. She also has a very odd bandlike area in the mid calf almost looking like there was a weakness in the wrap in a localized area. I would write this off as being this however anteriorly she has a small raised ballotable area that is very tender almost reminiscent of an abscess but there was no obvious purulent surface to it. 02/04/20 upon evaluation today patient appears to be doing fairly well in regard to her wounds today. Fortunately there is no signs of active infection at this time. No fevers, chills, nausea, vomiting, or diarrhea. She has been tolerating the dressing changes without complication. Fortunately I feel like she is showing signs of improvement although has been sometime since have seen her. Nonetheless the area of concern that Dr. Dellia Nims had last week where she had possibly an area of the wrap that was we can allow the leg to bulge appears to be doing significantly better today there is no signs of anything worsening. SORA, VROOMAN ([id number]) 4/7; the patient's wounds on her medial and lateral left leg continue to contract. We have been  using a regular alginate. Last week she developed an area on the right medial lower leg which is probably a venous ulcer as well. 4/14; the wounds on her left medial and lateral lower leg continue to contract. Surface eschar. We have been using regular alginate. The area on the right medial lower leg is closed. We have been putting both legs under 4-layer contraction. The patient went back to see vein and vascular she had arterial studies done which were apparently "quite good" per the patient although I have not read their notes I have never felt she had an arterial issue. 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 This is been longstanding and refractory to exercise, leg elevation and longstanding use of compression wraps in our clinic as well as compression stockings on the times  we have been able to get these to heal 4/21; we thought she actually might be close this week however she arrives in clinic with a lot of edema in her upper left calf and into her posterior thigh. This is been an intermittent problem here. She says the wrap fell down but it was replaced with a nurse visit on Monday. We are using calcium alginate to the wounds and the wound sizes there not terribly larger than last week but there is a lot more edema 4/28; again wound edges are smaller on both sides. Her edema is better controlled than last time. She is obtained her compression pumps from [employer] although they have not been to her home to set these up. 5/5; left medial and left lateral both look stable. I am not sure the medial is any smaller. We have been using calcium alginate under 4-layer compression. She had an area on the right medial. This was eschared today. We have been wrapping this as well. She does not tolerate external compression stockings due to a history of various contact allergies. She has her compression pumps however the representative from the company is coming on her to show her how to use these tomorrow 5/19; patient with severe chronic venous insufficiency secondary to central venous disease. She had a stent placed in her left common iliac vein. She has done better since but still difficult to control wounds. She comes in today with nothing open on the right leg. Her areas on the left medial and left lateral are just about closed. We are using calcium alginate under 4-layer compression. She is using her external compression pumps at home She only has 15-20 support stockings. States she cannot get anything tighter than that on. 03/30/20-Patient returns at 1 week, the wounds on the left leg are both slightly bigger, the last week she was on 3 layer compression which started to slide down. She is starting to use her lymphedema pumps although she stated on 1 day her right  ankle started to swell up and she have to stop that day. Unfortunately the open area seem to oscillate between improving to the point of healing and then flaring up all to do with effectiveness of compression or lack of due to the left leg topography not keeping the compression wraps from rolling down 6/2 patient comes in with a 15/20 mmHg stocking on the right leg. She tells me that she developed a lot of swelling in her ankles she saw orthopedics she was felt to possibly be having a flare of pseudogout versus some other type of arthritis. She was put on steroids for a respiratory issue so that helps  with the inflammation. She has not been using the pumps all week. She thinks the left thigh is more swollen than usual and I would agree with that. She has an appointment with Dr. Donzetta Matters 9 days or so from now 6/9; both wounds on the left medial and left lateral are smaller. We have been using calcium alginate under compression. She does not have an open wound on the right leg she is using a stocking and her compression pumps things are going well. She has an appointment with Dr. Donzetta Matters with regards to her stent in the left common iliac vein 6/16; the wounds on the left medial and left lateral ankle continues to contract. The patient saw Dr. Donzetta Matters and I think he seems satisfied. Ordered follow-up venous reflux studies on both sides in September. Cautioned that she may need thigh-high stockings. She has been using calcium alginate under compression on the left and her own stocking on the right leg. She tells [place] there are no open wounds on the right 6/23; left lateral is just about closed. Medial required debridement today. We have been using calcium alginate. Extensive discussion about the compression pumps she is only using these on 25 mmHg states she could not take 40 or 30 when the wrap came out to her home to demonstrate these. He said they should not feel tight 6/30; the left lateral wound has a slight  amount of eschar. . The area medially is about the same using Hydrofera Blue. 7/7; left lateral wound still has some eschar. I will remove this next week may be closed. The area medially is very small using Hydrofera Blue with improvement. Unfortunately the stockings fell down. Unfortunately the blisters have developed at the edge of where the wrap fell. When this happened she says her legs hurt she did not use her pumps. We are not open Monday for her to come in and change the wraps and she had an appointment yesterday. She also tells me that she is going to have an MRI of her back. She is having pain radiating into her left anterior leg she thinks her from an L5 disc. She saw Dr. Ellene Route of neurosurgery 7/14; the area on the left lateral ankle area is closed. Still a small area medially however it looks better as well. We have been using Hydrofera Blue under 4-layer compression 7/21; left lateral ankle is still closed however her wound on the medial left calf is actually larger. This is probably because Hydrofera Blue got stuck to the wound. She came in for a nurse change on Friday and will do that again this week I was concerned about the amount of swelling that she had last week however she is using her compression pumps twice a day and the swelling seems well controlled 7/28; remaining wound on the left medial lower leg is smaller. We have been using moistened silver collagen under compression she is coming back for a nurse visit. For reasons that were not really clear she was just keeping her legs elevated and not using her compression pumps. I have asked her to use the compression pumps. She does not have any wounds on the right leg 06/15/20-Patient returns at 2 weeks, her LLE edema is worse and she developed a blister wound that is new and has bigger posterior calf wound on right, we are using Prisma with pad, 4 layer compression. she has been on lasix 40 mg daily 8/18; patient arrives today  with things a lot worse than  I remember from a few weeks ago. She was seen last week. Noted that her edema was worse and that she now had a left lateral wound as well as deteriorating edema in the medial and posterior part of the lower leg. She says she is using his or her external compression pumps once a day although I wonder about the compliance. 8/25; weeping area on the right medial lower leg. This had actually gotten a small localized area of her compression stocking wet. On the left side there is a large denuded area on the posterior medial lower leg and smaller area on the lateral. This was not the original areas that we dealt with. 9/1 the patient's wound on the left leg include the left lateral and left posterior. Larger superficial wounds weeping. She has very poor edema control. Tender localized edema in the left lower medial ankle/heel probably because of localized wrap issues. She freely admits she is not using the compression pumps. She has been up on her feet a lot. She thinks the hydrofera blue is contributing to the pain she is experiencing.. This is a complaint that I have occasionally heard CARMILLA, [name] ([id number]) Objective Constitutional Patient is hypertensive.. Pulse regular and within target range for patient.Marland Kitchen Respirations regular, non-labored and within target range.. Temperature is normal and within the target range for the patient.Marland Kitchen appears in no distress. Vitals Time Taken: 12:45 PM, Height: 63 in, Weight: 224.7 lbs, BMI: 39.8, Temperature: 98.2 F, Pulse: 82 bpm, Respiratory Rate: 18 breaths/min, Blood Pressure: 154/83 mmHg. Cardiovascular Pedal pulsespedal pulses palpable in both feet. Reasonably poor edema control bilaterally.. General Notes: Wound exam absolutely no improvement in the left leg. Wounds are weeping and superficial. I think the area more posteriorly is worse than last time. She has very poor edema control bilaterally. There is however nothing  open on the right leg Integumentary (Hair, Skin) Wound #12 status is Open. Original cause of wound was Gradually Appeared. The wound is located on the Left,Lateral Lower Leg. The wound measures 4cm length x 4.5cm width x 0.1cm depth; 14.137cm^2 area and 1.414cm^3 volume. There is Fat Layer (Subcutaneous Tissue) exposed. There is a medium amount of serosanguineous drainage noted. There is small (1-33%) red granulation within the wound bed. There is a large (67-100%) amount of necrotic tissue within the wound bed including Adherent Slough. Wound #5 status is Open. Original cause of wound was Gradually Appeared. The wound is located on the Left,Medial Lower Leg. The wound measures 5cm length x 4.2cm width x 0.1cm depth; 16.493cm^2 area and 1.649cm^3 volume. There is Fat Layer (Subcutaneous Tissue) exposed. There is a medium amount of serosanguineous drainage noted. There is small (1-33%) red granulation within the wound bed. There is a large (67-100%) amount of necrotic tissue within the wound bed including Adherent Slough. 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 There was a Four Layer Compression Therapy Procedure with a pre-treatment ABI of 1 by Cornell Barman, RN. Post procedure Diagnosis Wound #12: Same as Pre-Procedure Plan Anesthetic (add to  Medication List): Wound #12 Left,Lateral Lower Leg: Topical Lidocaine 4% cream applied to wound  bed prior to debridement (In Clinic Only). Wound #5 Left,Medial Lower Leg: Topical Lidocaine 4% cream applied to wound bed prior to debridement (In Clinic Only). Primary Wound Dressing: Wound #12 Left,Lateral Lower Leg: Silver Alginate Wound #5 Left,Medial Lower Leg: ADALYND, DONAHOE ([id number]) Silver Alginate Secondary Dressing: Wound #12 Left,Lateral Lower Leg: ABD pad Wound #5 Left,Medial Lower Leg: ABD pad Dressing Change Frequency: Wound #12 Left,Lateral Lower Leg: Change dressing every week Other: - Nurse visit Friday and Monday if needed. Wound #5 Left,Medial Lower Leg: Change dressing every week Other: - Nurse visit Friday and Monday if needed. Follow-up Appointments: Wound #12 Left,Lateral Lower Leg: Return Appointment in 1 week. Nurse Visit as needed Wound #5 Left,Medial Lower Leg: Return Appointment in 1 week. Nurse Visit as needed Edema Control: Wound #12 Left,Lateral Lower Leg: 4 Layer Compression System - Bilateral Compression Pump: Use compression pump on left lower extremity for 60 minutes, twice daily. Compression Pump: Use compression pump on right lower extremity for 60 minutes, twice daily. Wound #5 Left,Medial Lower Leg: 4 Layer Compression System - Bilateral Compression Pump: Use compression pump on left lower extremity for 60 minutes, twice daily. Compression Pump: Use compression pump on right lower extremity for 60 minutes, twice daily. #1 really no progress. Because of the patient's complaint of pain in relation to the possibility of Hydrofera Blue I changed her to silver alginate the wounds on the left leg are wet and draining somewhat 2. Localized compression issues on the left lower leg which we will need to monitor. I do not think there is any infection here 3. She freely admits she is not using her compression pumps I talked to her about this again. She is going to experience increasing skin breakdown in the left leg without some degree of  compliance with these. I also mentioned to her lymphedema clinic she does not want to go back to 1 of those 4. Nurse visit on Friday Electronic Signature(s) Signed: 07/07/2020 9:14:54 AM By: Linton Ham MD Entered By: Linton Ham on 07/06/2020 13:26:02 LATEKA, RADY ([id number]) -------------------------------------------------------------------------------- SuperBill Details Patient Name: [name], [name]. Date of Service: 07/06/2020 Medical Record Number: [id number] Patient Account Number: [id number] Date of Birth/Sex: 1946/07/01 (40 y.[name]. 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 L97.818 Non-pressure chronic ulcer of other part of right lower leg with other specified severity Facility Procedures CPT4 Code: [id number] Description: (Facility Use Only) 29581LT - [place] LWR LT LEG Modifier: Quantity: 1 Physician Procedures CPT4 Code Description: [id number] - WC PHYS LEVEL 3 - EST PT Modifier: Quantity: 1 CPT4 Code Description: ICD-10 Diagnosis Description L97.221 Non-pressure chronic ulcer of left calf limited to breakdown of skin I89.0 Lymphedema, not elsewhere classified I87.332 Chronic venous hypertension (idiopathic) with ulcer and inflammation of  left Modifier: lower extremity Quantity: Electronic Signature(s) Signed: 07/07/2020 9:14:54 AM By: Linton Ham MD Entered  By: Linton Ham on 07/06/2020 13:26:36

## 2020-07-08 NOTE — Progress Notes (Signed)
[name] ([id number]) Visit Report for 07/06/2020 Arrival Information Details Patient Name: [name], [name]. Date of Service: 07/06/2020 12:30 PM Medical Record Number: [id number] Patient Account Number: [id number] Date of Birth/Sex: 1945/11/06 (74 y.o. F) Treating RN: Cornell Barman Primary Care Zenola Dezarn: [name] Other Clinician: Referring Jlynn Langille: [name] Treating Sariya Trickey/Extender: Tito Dine in Treatment: 22 Visit Information History Since Last Visit All ordered tests and consults were completed: No Patient Arrived: Ambulatory Added or deleted any medications: No Arrival Time: 13:05 Any new allergies or adverse reactions: No Accompanied By: self Had a fall or experienced change in No Transfer Assistance: None activities of daily living that may affect Patient Identification Verified: Yes risk of falls: Secondary Verification Process Completed: Yes Signs or symptoms of abuse/neglect since last visito No Patient Requires Transmission-Based No Hospitalized since last visit: No Precautions: Implantable device outside of the clinic excluding No Patient Has Alerts: Yes cellular tissue based products placed in the center Patient Alerts: Patient on Blood since last visit: Thinner Pain Present Now: No aspirin 81 Electronic Signature(s) Signed: 07/07/2020 4:36:13 PM By: [name] Entered By: [name] on 07/06/2020 13:06:30 [name], [name] ([id number]) -------------------------------------------------------------------------------- Compression Therapy Details Patient Name: [name], [name]. Date of Service: 07/06/2020 12:30 PM Medical Record Number: [id number] Patient Account Number: [id number] Date of Birth/Sex: Mar 11, 1946 (74 y.o. F) Treating RN: Cornell Barman Primary Care Ademide Schaberg: [name] Other Clinician: Referring Halo Laski: [name] Treating Alven Alverio/Extender: Tito Dine in Treatment: 82 Compression  Therapy Performed for Wound Assessment: Wound #12 Left,Lateral Lower Leg Performed By: Clinician Cornell Barman, RN Compression Type: Four Layer Pre Treatment ABI: 1 Post Procedure Diagnosis Same as Pre-procedure Electronic Signature(s) Signed: 07/07/2020 6:11:40 PM By: [name], [name], RN, CWS, [name], [name] Entered By: [name], [name], RN, CWS, Kim on 07/06/2020 13:13:44 [name] ([id number]) -------------------------------------------------------------------------------- Encounter Discharge Information Details Patient Name: [name]. Date of Service: 07/06/2020 12:30 PM Medical Record Number: [id number] Patient Account Number: [id number] Date of Birth/Sex: 29-Sep-1946 (74 y.o. F) Treating RN: Cornell Barman Primary Care Lige Lakeman: [name] Other Clinician: Referring Brenton Joines: [name] Treating Tery Hoeger/Extender: Tito Dine in Treatment: 12 Encounter Discharge Information Items Discharge Condition: Stable Ambulatory Status: Ambulatory Discharge Destination: Home Transportation: Private Auto Accompanied By: self Schedule Follow-up Appointment: Yes Clinical Summary of Care: Electronic Signature(s) Signed: 07/07/2020 6:11:40 PM By: [name], [name], RN, CWS, [name], [name] Entered By: [name], [name], RN, CWS, Kim on 07/06/2020 13:17:54 [name] ([id number]) -------------------------------------------------------------------------------- Lower Extremity Assessment Details Patient Name: [name]. Date of Service: 07/06/2020 12:30 PM Medical Record Number: [id number] Patient Account Number: [id number] Date of Birth/Sex: 1946/10/07 (74 y.o. F) Treating RN: Cornell Barman Primary Care Tylon Kemmerling: [name] Other Clinician: Referring Laroy Mustard: [name] Treating Lissete Maestas/Extender: Tito Dine in Treatment: 82 Edema Assessment Assessed: [Left: Yes] [Right: No] Edema: [Left: Ye] [Right: s] Calf Left: Right: Point of Measurement: 33 cm From  Medial Instep 38 cm cm Ankle Left: Right: Point of Measurement: 13 cm From Medial Instep 23 cm cm Vascular Assessment Pulses: Dorsalis Pedis Palpable: [Left:Yes] Posterior Tibial Palpable: [Left:Yes] Electronic Signature(s) Signed: 07/07/2020 4:36:13 PM By: [name] Signed: 07/07/2020 6:11:40 PM By: [name], [name], RN, CWS, [name], [name] Entered By: [name] on 07/06/2020 13:07:43 [name] ([id number]) -------------------------------------------------------------------------------- Multi Wound Chart Details Patient Name: [name], [name]. Date of Service: 07/06/2020 12:30 PM Medical Record Number: [id number] Patient Account Number: [id number] Date of Birth/Sex: Sep 30, 1946 (74 y.o. F) Treating RN: Cornell Barman Primary  Care Keisy Strickler: [name] Other Clinician: Referring Wyolene Weimann: [name] Treating Dessire Grimes/Extender: Tito Dine in Treatment: 70 Vital Signs Height(in): 66 Pulse(bpm): 78 Weight(lbs): 224.7 Blood Pressure(mmHg): 154/83 Body Mass Index(BMI): 40 Temperature(F): 98.2 Respiratory Rate(breaths/min): 18 Photos: [N/A:N/A] Wound Location: Left, Lateral Lower Leg Left, Medial Lower Leg N/A Wounding Event: Gradually Appeared Gradually Appeared N/A Primary Etiology: Venous Leg Ulcer Lymphedema N/A Comorbid History: Cataracts, Asthma, Sleep Apnea, Cataracts, Asthma, Sleep Apnea, N/A Deep Vein Thrombosis, Deep Vein Thrombosis, Hypertension, Peripheral Venous Hypertension, Peripheral Venous Disease, Osteoarthritis, Received Disease, Osteoarthritis, Received Chemotherapy, Received Radiation Chemotherapy, Received Radiation Date Acquired: 06/15/2020 11/19/2018 N/A Weeks of Treatment: 3 82 N/A Wound Status: Open Open N/A Measurements L x W x D (cm) 4x4.5x0.1 5x4.2x0.1 N/A Area (cm) : 14.137 16.493 N/A Volume (cm) : 1.414 1.649 N/A % Reduction in Area: -2402.10% -93.00% N/A % Reduction in Volume: -2380.70% -92.90% N/A Classification:  Partial Thickness Full Thickness Without Exposed N/A Support Structures Exudate Amount: Medium Medium N/A Exudate Type: Serosanguineous Serosanguineous N/A Exudate Color: red, brown red, brown N/A Granulation Amount: Small (1-33%) Small (1-33%) N/A Granulation Quality: Red Red N/A Necrotic Amount: Large (67-100%) Large (67-100%) N/A Exposed Structures: Fat Layer (Subcutaneous Tissue): Fat Layer (Subcutaneous Tissue): N/A Yes Yes Fascia: No Fascia: No Tendon: No Tendon: No Muscle: No Muscle: No Joint: No Joint: No Bone: No Bone: No Epithelialization: Small (1-33%) N/A N/A Procedures Performed: Compression Therapy N/A N/A Treatment Notes Wound #12 (Left, Lateral Lower Leg) Notes Silver Cell, abd, 4LL, unna to anchor [name], [name]. ([id number]) Wound #5 (Left, Medial Lower Leg) Notes Silver Cell, abd, 4LL, unna to anchor Electronic Signature(s) Signed: 07/07/2020 9:14:54 AM By: Linton Ham MD Entered By: Linton Ham on 07/06/2020 13:20:39 [name] ([id number]) -------------------------------------------------------------------------------- Multi-Disciplinary Care Plan Details Patient Name: [name], [name]. Date of Service: 07/06/2020 12:30 PM Medical Record Number: [id number] Patient Account Number: [id number] Date of Birth/Sex: Aug 24, 1946 (74 y.o. F) Treating RN: Cornell Barman Primary Care Blossom Crume: [name] Other Clinician: Referring Kadee Philyaw: [name] Treating Sophi Calligan/Extender: Tito Dine in Treatment: 55 Active Inactive Medication Nursing Diagnoses: Knowledge deficit related to medication safety: actual or potential Goals: Patient/caregiver will demonstrate understanding of new oral/IV medications prescribed at the [facility] (topical prescriptions are covered under the skin breakdown problem) Date Initiated: 12/16/2019 Target Resolution Date: 01/13/2020 Goal Status: Active Interventions: Assess for medication contraindications  each visit where new medications are prescribed Treatment Activities: New medication prescribed at [place] : 12/16/2019 Notes: Soft Tissue Infection Nursing Diagnoses: Impaired tissue integrity Goals: Patient's soft tissue infection will resolve Date Initiated: 12/10/2018 Target Resolution Date: 01/09/2019 Goal Status: Active Interventions: Assess signs and symptoms of infection every visit Notes: Venous Leg Ulcer Nursing Diagnoses: Actual venous Insuffiency (use after diagnosis is confirmed) Goals: Patient will maintain optimal edema control Date Initiated: 12/10/2018 Target Resolution Date: 01/09/2019 Goal Status: Active Interventions: Assess peripheral edema status every visit. Treatment Activities: Therapeutic compression applied : 12/10/2018 Notes: Wound/Skin Impairment Nursing Diagnoses: JAZMINA, MUHLENKAMP ([id number]) Impaired tissue integrity Goals: Patient/caregiver will verbalize understanding of skin care regimen Date Initiated: 12/10/2018 Target Resolution Date: 01/09/2019 Goal Status: Active Interventions: Assess ulceration(s) every visit Treatment Activities: Topical wound management initiated : 12/10/2018 Notes: Electronic Signature(s) Signed: 07/07/2020 6:11:40 PM By: [name], [name], RN, CWS, [name], [name] Entered By: [name], [name], RN, CWS, Kim on 07/06/2020 13:11:45 [name] ([id number]) -------------------------------------------------------------------------------- Pain Assessment Details Patient Name: [name], [name]. Date of Service: 07/06/2020 12:30 PM Medical Record Number: [id number] Patient Account Number: [id number] Date  of Birth/Sex: 02/08/1946 (74 y.o. F) Treating RN: Cornell Barman Primary Care Jadore Veals: [name] Other Clinician: Referring Roldan Laforest: [name] Treating Areli [name]/Extender: Tito Dine in Treatment: 75 Active Problems Location of Pain Severity and Description of Pain Patient Has Paino Yes Site Locations Pain  Location: Pain in Ulcers With Dressing Change: Yes Rate the pain. Current Pain Level: 7 Worst Pain Level: 10 Least Pain Level: 2 Tolerable Pain Level: 2 Character of Pain Describe the Pain: Aching, Burning, Tender Pain Management and Medication Current Pain Management: Electronic Signature(s) Signed: 07/07/2020 4:36:13 PM By: [name] Signed: 07/07/2020 6:11:40 PM By: [name], [name], RN, CWS, [name], [name] Entered By: [name] on 07/06/2020 13:07:31 [name] ([id number]) -------------------------------------------------------------------------------- Patient/Caregiver Education Details Patient Name: [name], [name]. Date of Service: 07/06/2020 12:30 PM Medical Record Number: [id number] Patient Account Number: [id number] Date of Birth/Gender: [birth year] (74 y.o. F) Treating RN: Cornell Barman Primary Care Physician: [name] Other Clinician: Referring Physician: [name] Treating Physician/Extender: Tito Dine in Treatment: 44 Education Assessment Education Provided To: Patient Education Topics Provided Venous: Handouts: Controlling Swelling with Multilayered Compression Wraps Methods: Demonstration, Explain/Verbal Responses: State content correctly Wound/Skin Impairment: Handouts: Caring for Your Ulcer Methods: Demonstration, Explain/Verbal Responses: State content correctly Electronic Signature(s) Signed: 07/07/2020 6:11:40 PM By: [name], [name], RN, CWS, [name], [name] Entered By: [name], [name], RN, CWS, Kim on 07/06/2020 13:16:24 [name] ([id number]) -------------------------------------------------------------------------------- Wound Assessment Details Patient Name: [name], [name]. Date of Service: 07/06/2020 12:30 PM Medical Record Number: [id number] Patient Account Number: [id number] Date of Birth/Sex: 08-28-1946 (74 y.o. F) Treating RN: Cornell Barman Primary Care Tiffanie Blassingame: [name] Other Clinician: Referring Shandi Godfrey:  [name] Treating Mirjana Tarleton/Extender: Tito Dine in Treatment: 82 Wound Status Wound Number: 12 Primary Venous Leg Ulcer Etiology: Wound Location: Left, Lateral Lower Leg Wound Open Wounding Event: Gradually Appeared Status: Date Acquired: 06/15/2020 Comorbid Cataracts, Asthma, Sleep Apnea, Deep Vein Thrombosis, Weeks Of Treatment: 3 History: Hypertension, Peripheral Venous Disease, Osteoarthritis, Clustered Wound: No Received Chemotherapy, Received Radiation Photos Wound Measurements Length: (cm) 4 Width: (cm) 4.5 Depth: (cm) 0.1 Area: (cm) 14.137 Volume: (cm) 1.414 % Reduction in Area: -2402.1% % Reduction in Volume: -2380.7% Epithelialization: Small (1-33%) Wound Description Classification: Partial Thickness Fo Exudate Amount: Medium Sl Exudate Type: Serosanguineous Exudate Color: red, brown ul Odor After Cleansing: No ough/Fibrino No Wound Bed Granulation Amount: Small (1-33%) Exposed Structure Granulation Quality: Red Fascia Exposed: No Necrotic Amount: Large (67-100%) Fat Layer (Subcutaneous Tissue) Exposed: Yes Necrotic Quality: Adherent Slough Tendon Exposed: No Muscle Exposed: No Joint Exposed: No Bone Exposed: No Treatment Notes Wound #12 (Left, Lateral Lower Leg) Notes Silver Cell, abd, 4LL, unna to anchor Electronic Signature(s) Signed: 07/07/2020 4:36:13 PM By: Gena Fray ([id number]) Signed: 07/07/2020 6:11:40 PM By: [name], [name], RN, CWS, [name], [name] Entered By: [name] on 07/06/2020 13:04:35 [name] ([id number]) -------------------------------------------------------------------------------- Wound Assessment Details Patient Name: [name], [name]. Date of Service: 07/06/2020 12:30 PM Medical Record Number: [id number] Patient Account Number: [id number] Date of Birth/Sex: September 24, 1946 (74 y.o. F) Treating RN: Cornell Barman Primary Care Avner Stroder: [name] Other Clinician: Referring  Falon Huesca: [name] Treating Izzah Pasqua/Extender: Tito Dine in Treatment: 82 Wound Status Wound Number: 5 Primary Lymphedema Etiology: Wound Location: Left, Medial Lower Leg Wound Open Wounding Event: Gradually Appeared Status: Date Acquired: 11/19/2018 Comorbid Cataracts, Asthma, Sleep Apnea, Deep Vein Thrombosis, Weeks Of Treatment: 82 History: Hypertension, Peripheral Venous Disease, Osteoarthritis, Clustered Wound: No Received Chemotherapy, Received Radiation  Photos Wound Measurements Length: (cm) 5 Width: (cm) 4.2 Depth: (cm) 0.1 Area: (cm) 16.493 Volume: (cm) 1.649 % Reduction in Area: -93% % Reduction in Volume: -92.9% Wound Description Classification: Full Thickness Without Exposed Support Struct Exudate Amount: Medium Exudate Type: Serosanguineous Exudate Color: red, brown ures Foul Odor After Cleansing: No Slough/Fibrino No Wound Bed Granulation Amount: Small (1-33%) Exposed Structure Granulation Quality: Red Fascia Exposed: No Necrotic Amount: Large (67-100%) Fat Layer (Subcutaneous Tissue) Exposed: Yes Necrotic Quality: Adherent Slough Tendon Exposed: No Muscle Exposed: No Joint Exposed: No Bone Exposed: No Treatment Notes Wound #5 (Left, Medial Lower Leg) Notes Silver Cell, abd, 4LL, unna to anchor Electronic Signature(s) Signed: 07/07/2020 4:36:13 PM By: Gena Fray ([id number]) Signed: 07/07/2020 6:11:40 PM By: [name], [name], RN, CWS, [name], [name] Entered By: [name] on 07/06/2020 13:05:12 [name], [name] ([id number]) -------------------------------------------------------------------------------- Vitals Details Patient Name: [name], [name]. Date of Service: 07/06/2020 12:30 PM Medical Record Number: [id number] Patient Account Number: [id number] Date of Birth/Sex: 18-Sep-1946 (74 y.o. F) Treating RN: Cornell Barman Primary Care Mikale Silversmith: [name] Other Clinician: Referring Krystopher Kuenzel: [name] Treating Deanne Bedgood/Extender: Tito Dine in Treatment: 82 Vital Signs Time Taken: 12:45 Temperature (F): 98.2 Height (in): 63 Pulse (bpm): 82 Weight (lbs): 224.7 Respiratory Rate (breaths/min): 18 Body Mass Index (BMI): 39.8 Blood Pressure (mmHg): 154/83 Reference Range: 80 - 120 mg / dl Electronic Signature(s) Signed: 07/07/2020 4:36:13 PM By: [name] Entered By: [name] on 07/06/2020 13:06:52

## 2020-07-08 NOTE — Progress Notes (Signed)
IDALYS, KONECNY ([id number]) Visit Report for 07/08/2020 Arrival Information Details Patient Name: [name], [name]. Date of Service: 07/08/2020 10:45 AM Medical Record Number: [id number] Patient Account Number: [id number] Date of Birth/Sex: 1946/08/08 (74 y.o. F) Treating RN: [name] Canavan Primary Care Tiawanna Luchsinger: [name] Other Clinician: Referring Carrol Bondar: [name] Treating Travaughn Vue/Extender: Melburn Hake, HOYT Weeks in Treatment: 82 Visit Information History Since Last Visit All ordered tests and consults were completed: No Patient Arrived: Ambulatory Added or deleted any medications: No Arrival Time: 11:07 Any new allergies or adverse reactions: No Accompanied By: self Had a fall or experienced change in No Transfer Assistance: None activities of daily living that may affect Patient Identification Verified: Yes risk of falls: Secondary Verification Process Completed: Yes Signs or symptoms of abuse/neglect since last visito No Patient Requires Transmission-Based No Hospitalized since last visit: No Precautions: Pain Present Now: No Patient Has Alerts: Yes Patient Alerts: Patient on Blood Thinner aspirin 81 Electronic Signature(s) Signed: 07/08/2020 5:24:12 PM By: [name] Entered By: [name] on 07/08/2020 11:07:42 Rainelle, [name] ([id number]) -------------------------------------------------------------------------------- Compression Therapy Details Patient Name: [name], [name]. Date of Service: 07/08/2020 10:45 AM Medical Record Number: [id number] Patient Account Number: [id number] Date of Birth/Sex: [birth year] (74 y.o. F) Treating RN: [name] Canavan Primary Care Crist Kruszka: [name] Other Clinician: Referring Cassidey Barrales: [name] Treating Shayra Anton/Extender: Melburn Hake, HOYT Weeks in Treatment: 82 Compression Therapy Performed for Wound Assessment: Wound #12 Left,Lateral Lower Leg Performed By: Ponciano Ort,  RN Compression Type: Four Layer Electronic Signature(s) Signed: 07/08/2020 5:11:36 PM By: [name] Canavan Entered By: [name] Canavan on 07/08/2020 12:05:01 [name] ([id number]) -------------------------------------------------------------------------------- Compression Therapy Details Patient Name: [name], [name]. Date of Service: 07/08/2020 10:45 AM Medical Record Number: [id number] Patient Account Number: [id number] Date of Birth/Sex: 27-Sep-1946 (74 y.o. F) Treating RN: [name] Canavan Primary Care Yarisa Lynam: [name] Other Clinician: Referring Lashina Milles: [name] Treating Talynn Lebon/Extender: Melburn Hake, HOYT Weeks in Treatment: 82 Compression Therapy Performed for Wound Assessment: Wound #5 Left,Medial Lower Leg Performed By: Clinician [name] Canavan, RN Compression Type: Four Layer Electronic Signature(s) Signed: 07/08/2020 5:11:36 PM By: [name] Canavan Entered By: [name] Canavan on 07/08/2020 12:05:01 [name], SOBERANIS ([id number]) -------------------------------------------------------------------------------- Encounter Discharge Information Details Patient Name: [name], [name]. Date of Service: 07/08/2020 10:45 AM Medical Record Number: [id number] Patient Account Number: [id number] Date of Birth/Sex: 1946/08/01 (74 y.o. F) Treating RN: [name] Canavan Primary Care Dannel Rafter: [name] Other Clinician: Referring Amulya Quintin: [name] Treating Janayia Burggraf/Extender: Melburn Hake, HOYT Weeks in Treatment: 42 Encounter Discharge Information Items Discharge Condition: Stable Ambulatory Status: Ambulatory Discharge Destination: Home Transportation: Private Auto Accompanied By: self Schedule Follow-up Appointment: Yes Clinical Summary of Care: Electronic Signature(s) Signed: 07/08/2020 5:11:36 PM By: [name] Canavan Entered By: [name] Canavan on 07/08/2020 12:04:32 RONIE, FLEEGER  ([id number]) -------------------------------------------------------------------------------- Wound Assessment Details Patient Name: [name], [name]. Date of Service: 07/08/2020 10:45 AM Medical Record Number: [id number] Patient Account Number: [id number] Date of Birth/Sex: 12/20/1945 (74 y.o. F) Treating RN: [name] Canavan Primary Care Eldana Isip: [name] Other Clinician: Referring Johnni Wunschel: [name] Treating Ranelle Auker/Extender: Melburn Hake, HOYT Weeks in Treatment: 82 Wound Status Wound Number: 12 Primary Venous Leg Ulcer Etiology: Wound Location: Left, Lateral Lower Leg Wound Open Wounding Event: Gradually Appeared Status: Date Acquired: 06/15/2020 Comorbid Cataracts, Asthma, Sleep Apnea, Deep Vein Thrombosis, Weeks Of Treatment: 3 History: Hypertension, Peripheral Venous Disease, Osteoarthritis, Clustered Wound: No Received Chemotherapy, Received Radiation Photos Wound Measurements Length: (cm) 4 Width: (cm) 4.5 Depth: (cm) 0.1 Area: (cm) 14.137 Volume: (cm) 1.414 %  Reduction in Area: -2402.1% % Reduction in Volume: -2380.7% Epithelialization: Small (1-33%) Wound Description Classification: Partial Thickness Fou Exudate Amount: Medium Slo Exudate Type: Serosanguineous Exudate Color: red, brown l Odor After Cleansing: No ugh/Fibrino No Wound Bed Granulation Amount: Small (1-33%) Exposed Structure Granulation Quality: Red Fascia Exposed: No Necrotic Amount: Large (67-100%) Fat Layer (Subcutaneous Tissue) Exposed: Yes Necrotic Quality: Adherent Slough Tendon Exposed: No Muscle Exposed: No Joint Exposed: No Bone Exposed: No Treatment Notes Wound #12 (Left, Lateral Lower Leg) Notes scell, abd 4LL Electronic Signature(s) Signed: 07/08/2020 5:11:36 PM By: Jone Baseman, [name] ([id number]) Signed: 07/08/2020 5:24:12 PM By: [name] Entered By: [name] on 07/08/2020 11:12:53 Brigance, [name]  ([id number]) -------------------------------------------------------------------------------- Wound Assessment Details Patient Name: [name], [name]. Date of Service: 07/08/2020 10:45 AM Medical Record Number: [id number] Patient Account Number: [id number] Date of Birth/Sex: [birth year] (74 y.o. F) Treating RN: [name] Canavan Primary Care Bri Wakeman: [name] Other Clinician: Referring Kelsha Older: [name] Treating Zonie Crutcher/Extender: Melburn Hake, HOYT Weeks in Treatment: 82 Wound Status Wound Number: 5 Primary Lymphedema Etiology: Wound Location: Left, Medial Lower Leg Wound Open Wounding Event: Gradually Appeared Status: Date Acquired: 11/19/2018 Comorbid Cataracts, Asthma, Sleep Apnea, Deep Vein Thrombosis, Weeks Of Treatment: 82 History: Hypertension, Peripheral Venous Disease, Osteoarthritis, Clustered Wound: No Received Chemotherapy, Received Radiation Photos Wound Measurements Length: (cm) 5.5 Width: (cm) 5 Depth: (cm) 0.1 Area: (cm) 21.598 Volume: (cm) 2.16 % Reduction in Area: -152.8% % Reduction in Volume: -152.6% Wound Description Classification: Full Thickness Without Exposed Support Structu Exudate Amount: Medium Exudate Type: Serosanguineous Exudate Color: red, brown res Foul Odor After Cleansing: No Slough/Fibrino No Wound Bed Granulation Amount: Small (1-33%) Exposed Structure Granulation Quality: Red Fascia Exposed: No Necrotic Amount: Large (67-100%) Fat Layer (Subcutaneous Tissue) Exposed: Yes Necrotic Quality: Adherent Slough Tendon Exposed: No Muscle Exposed: No Joint Exposed: No Bone Exposed: No Treatment Notes Wound #5 (Left, Medial Lower Leg) Notes scell, abd 4LL Electronic Signature(s) Signed: 07/08/2020 5:11:36 PM By: Jone Baseman, [name] ([id number]) Signed: 07/08/2020 5:24:12 PM By: [name] Entered By: [name] on 07/08/2020 11:13:26

## 2020-07-09 NOTE — Progress Notes (Signed)
MERRIT, WAUGH ([id number]) Visit Report for 07/08/2020 Physician Orders Details Patient Name: [name], [name]. Date of Service: 07/08/2020 10:45 AM Medical Record Number: [id number] Patient Account Number: [id number] Date of Birth/Sex: 06/11/1946 (74 y.o. F) Treating RN: [name] Canavan Primary Care Provider: [name] Other Clinician: Referring Provider: [name] Treating Provider/Extender: Melburn Hake, Saleah Rishel Weeks in Treatment: 68 Verbal / Phone Orders: No Diagnosis Coding Anesthetic (add to Medication List) Wound #12 Left,Lateral Lower Leg o Topical Lidocaine 4% cream applied to wound bed prior to debridement (In Clinic Only). Wound #5 Left,Medial Lower Leg o Topical Lidocaine 4% cream applied to wound bed prior to debridement (In Clinic Only). Primary Wound Dressing Wound #12 Left,Lateral Lower Leg o Silver Alginate Wound #5 Left,Medial Lower Leg o Silver Alginate Secondary Dressing Wound #12 Left,Lateral Lower Leg o ABD pad Wound #5 Left,Medial Lower Leg o ABD pad Dressing Change Frequency Wound #12 Left,Lateral Lower Leg o Change dressing every week o Other: - Nurse visit Friday and Monday if needed. Wound #5 Left,Medial Lower Leg o Change dressing every week o Other: - Nurse visit Friday and Monday if needed. Follow-up Appointments Wound #12 Left,Lateral Lower Leg o Return Appointment in 1 week. o Nurse Visit as needed Wound #5 Left,Medial Lower Leg o Return Appointment in 1 week. o Nurse Visit as needed Edema Control Wound #12 Left,Lateral Lower Leg o 4-Layer Compression System - Left Lower Extremity. o Patient to wear own compression stockings - Right leg o Compression Pump: Use compression pump on left lower extremity for 60 minutes, twice daily. o Compression Pump: Use compression pump on right lower extremity for 60 minutes, twice daily. Wound #5 Left,Medial Lower Leg o 4 Layer Compression System -  Bilateral o Compression Pump: Use compression pump on left lower extremity for 60 minutes, twice daily. o Compression Pump: Use compression pump on right lower extremity for 60 minutes, twice daily. [name], [name] ([id number]) Electronic Signature(s) Signed: 07/08/2020 5:11:36 PM By: [name] Canavan Signed: 07/09/2020 11:00:59 AM By: Worthy Keeler PA-C Entered By: [name] Canavan on 07/08/2020 12:02:24 [name], [name] ([id number]) -------------------------------------------------------------------------------- SuperBill Details Patient Name: [name], [name]. Date of Service: 07/08/2020 Medical Record Number: [id number] Patient Account Number: [id number] Date of Birth/Sex: 14-May-1946 (74 y.o. 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

## 2020-07-13 ENCOUNTER — Other Ambulatory Visit: Payer: Self-pay

## 2020-07-13 ENCOUNTER — Encounter: Payer: Medicare Other | Admitting: Internal Medicine

## 2020-07-13 DIAGNOSIS — I872 Venous insufficiency (chronic) (peripheral): Secondary | ICD-10-CM | POA: Diagnosis not present

## 2020-07-13 DIAGNOSIS — L97818 Non-pressure chronic ulcer of other part of right lower leg with other specified severity: Secondary | ICD-10-CM | POA: Diagnosis not present

## 2020-07-13 DIAGNOSIS — I10 Essential (primary) hypertension: Secondary | ICD-10-CM | POA: Diagnosis not present

## 2020-07-13 DIAGNOSIS — L97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d: Secondary | ICD-10-CM | POA: Diagnosis not present

## 2020-07-13 DIAGNOSIS — G4733 Obstructive sleep apnea (adult) (pediatric): Secondary | ICD-10-CM | POA: Diagnosis not present

## 2020-07-13 DIAGNOSIS — L97221 Non-pressure chronic ulcer of left calf limited to breakdown of skin: Secondary | ICD-10-CM | POA: Diagnosis not present

## 2020-07-13 DIAGNOSIS — I890 Lymphedema, not elsewhere classified: Secondary | ICD-10-CM | POA: Diagnosis not present

## 2020-07-14 NOTE — Progress Notes (Signed)
[name], [name] ([id number]) Visit Report for 07/13/2020 HPI Details Patient Name: [name], [name]. Date of Service: 07/13/2020 12:30 PM Medical Record Number: [id number] Patient Account Number: [id number] Date of Birth/Sex: 1946-01-03 (74 y.o. F) Treating RN: Cornell Barman Primary Care Provider: [name] Other Clinician: Referring Provider: [name] Treating Provider/Extender: Tito Dine in Treatment: 68 History of Present Illness HPI Description: Pleasant 74 year old with history of chronic venous insufficiency. No diabetes or peripheral vascular disease. Left ABI 1.29. Questionable history of left lower extremity DVT. She developed a recurrent ulceration on her left lateral calf in December 2015, which she attributes to poor diet and subsequent lower extremity edema. She underwent endovenous laser ablation of her left greater saphenous vein in 2010. She underwent laser ablation of accessory branch of left GSV in April 2016 by Dr. Kellie Simmering at [facility]. She was previously wearing Unna boots, which she tolerated well. Tolerating 2 layer compression and cadexomer iodine. She returns to clinic for follow-up and is without new complaints. She denies any significant pain at this time. She reports persistent pain with pressure. No claudication or ischemic rest pain. No fever or chills. No drainage. READMISSION 11/13/16; this is a 74 year old woman who is not a diabetic. She is here for a review of a painful area on her left medial lower extremity. I note that she was seen here previously last year for wound I believe to be in the same area. At that time she had undergone previously a left greater saphenous vein ablation by Dr. Kellie Simmering and she had a ablation of the anterior accessory branch of the left greater saphenous vein in March 2016. Seeing that the wound actually closed over. In reviewing the history with her today the ulcer in this area has been recurrent. She describes  a biopsy of this area in 2009 that only showed stasis physiology. 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 Do not see that she has ever had arterial studies in the left leg. She tells me she has a follow-up with Dr. Kellie Simmering in roughly 10 days In any case she developed the reopening of this area roughly a month ago. On the background of this she describes rapidly increasing edema which has responded to Lasix 40 mg and metolazone 2.5 mg as well as the patient's lymph massage. She has been told she has both venous insufficiency and lymphedema but she cannot tolerate compression stockings 11/28/16; the patient saw Dr. Kellie Simmering recently. Per the patient he did arterial Dopplers in the office that did not show evidence of arterial insufficiency, per the patient he stated "treat this like an ordinary venous ulcer". She also saw her dermatologist Dr. Ronnald Ramp who felt that this was more of a vascular ulcer. In general things are improving although she arrives today with increasing bilateral lower extremity edema with weeping a deeper fluid through the wound on the left medial leg compatible with some degree of lymphedema 12/04/16; the patient's wound is fully epithelialized but I don't think fully healed. We will do another week of depression with Promogran and TCA however I suspect we'll be able to discharge her next week. This is a very unusual-looking wound which was initially a figure-of-eight type wound lying on its side surrounded by petechial like hemorrhage. She  has had venous ablation on this side. She apparently does not have an arterial issue per Dr. Kellie Simmering. She saw her dermatologist thought it was "vascular". Patient is definitely going to need ongoing compression and I talked about  this with her today she will go to elastic therapy after she leaves here next week 12/11/16; the patient's wound is not completely closed today. She has surrounding scar tissue and in further discussion with the patient it would appear that she had ulcers in this area in 2009 for a prolonged period of time ultimately requiring a punch biopsy of this area that only showed venous insufficiency. I did not previously pickup on this part of the history from the patient. 12/18/16; the patient's wound is completely epithelialized. There is no open area here. She has significant bilateral venous insufficiency with secondary lymphedema to a mild-to-moderate degree she does not have compression stockings.. She did not say anything to me when I was in the room, she told our intake nurse that she was still having pain in this area. This isn't unusual recurrent small open area. She is going to go to elastic therapy to obtain compression stockings. 12/25/16; the patient's wound is fully epithelialized. There is no open area here. The patient describes some continued episodic discomfort in this area medial left calf. However everything looks fine and healed here. She is been to elastic therapy and caught herself 15-20 mmHg stockings, they apparently were having trouble getting 20-30 mm stockings in her size 01/22/17; this is a patient we discharged from the clinic a month ago. She has a recurrent open wound on her medial left calf. She had 15 mm support stockings. I told her I thought she needed 20-30 mm compression stockings. She tells me that she has been ill with hospitalization secondary to asthma and is been found to have severe hypokalemia likely secondary to a combination of Lasix and metolazone. This morning she noted blistering and leaking fluid on the posterior part of her left leg. She called our intake nurse urgently and we was saw her this afternoon. She has not had any real discomfort here. I don't know  that she's been wearing any stockings on this leg for at least 2-3 days. ABIs in this clinic were 1.21 on the right and 1.3 on the left. She is previously seen vascular surgery who does not think that there is a peripheral arterial issue. 01/30/17; Patient arrives with no open wound on the left leg. She has been to elastic therapy and obtained 20-14mmhg below knee stockings and she has one on the right leg today. READMISSION 02/19/18; this Cirrincione is a now 74 year old patient we've had in this clinic perhaps 3 times before. I had last looked at her from January 07 December 2016 with an area on the medial left leg. We discharged her on 12/25/16 however she had to be readmitted on 01/22/17 with a recurrence. I have in my notes that we discharged her on 20-30 mm stockings although she tells me she was only wearing support hose because she cannot get stockings on predominantly related to her cervical spine surgery/issues. She has had previous ablations done by vein and vascular in [place] including a great saphenous vein ablation on the left with an anterior accessory branch ablation I think both of these were in 2016. On one of the previous visit she had a biopsy noted 2009 that was negative. She is not felt to have an arterial issue. She is not a diabetic. She does  have a history of obstructive sleep apnea hypertension asthma as well as chronic venous insufficiency and lymphedema. On this occasion she noted 2 dry scaly patch on her left leg. She tried to put lotion on this it didn't really help. There were 2 open areas.the JAMERIA, [name] ([id number]) patient has been seeing her primary physician from 02/05/18 through 02/14/18. She had Unna boots applied. The superior wound now on the lateral left leg has closed but she's had one wound that remains open on the lateral left leg. This is not the same spot as we dealt with in 2018. ABIs in this clinic were 1.3 bilaterally 02/26/18; patient has a small wound on  the left lateral calf. Dimensions are down. She has chronic venous insufficiency and lymphedema. 03/05/18; small open area on the left lateral calf. Dimensions are down. Tightly adherent necrotic debris over the surface of the wound which was difficult to remove. Also the dressing [over collagen] stuck to the wound surface. This was removed with some difficulty as well. Change the primary dressing to Hydrofera Blue ready 03/12/18; small open area on the left lateral calf. Comes in with tightly adherent surface eschar as well as some adherent Hydrofera Blue. 03/19/18; open area on the left lateral calf. Again adherent surface eschar as well as some adherent Hydrofera Blue nonviable subcutaneous tissue. She complained of pain all week even with the reduction from 4-3 layer compression I put on last week. Also she had an increase in her ankle and calf measurements probably related to the same thing. 03/26/18; open area on the left lateral calf. A very small open area remains here. We used silver alginate starting last week as the Hydrofera Blue seem to stick to the wound bed. In using 4-layer compression 04/02/18; the open area in the left lateral calf at some adherent slough which I removed there is no open area here. We are able to transition her into her own compression stocking. Truthfully I think this is probably his support hose. However this does not maintain skin integrity will be limited. She cannot put over the toe compression stockings on because of neck problems hand problems etc. She is allergic to the lining layer of juxta lites. We might be forced to use extremitease stocking should this fail READMIT 11/24/2018 Patient is now a 74 year old woman who is not a diabetic. She has been in this clinic on at least 3 previous occasions largely with recurrent wounds on her left leg secondary to chronic venous insufficiency with secondary lymphedema. Her situation is complicated by inability to  get stockings on and an allergy to neoprene which is apparently a component and at least juxta lites and other stockings. As a result she really has not been wearing any stockings on her legs. She tells [place] that roughly 2 or 3 weeks ago she started noticing a stinging sensation just above her ankle on the left medial aspect. She has been diagnosed with pseudogout and she wondered whether this was what she was experiencing. She tried to dress this with something she bought at the store however subsequently it pulled skin off and now she has an open wound that is not improving. She has been using Vaseline gauze with a cover bandage. She saw her primary doctor last week who put an Haematologist on her. ABIs in this clinic was 1.03 on the left 2/12; the area is on the left medial ankle. Odd-looking wound with what looks to be surface epithelialization but a multitude of  small petechial openings. This clearly not closed yet. We have been using silver alginate under 3 layer compression with TCA 2/19; the wound area did not look quite as good this week. Necrotic debris over the majority of the wound surface which required debridement. She continues to have a multitude of what looked to be small petechial openings. She reminds [place] that she had a biopsy on this initially during her first outbreak in 2015 in [place] [facility]. She expresses concern about this being a possible melanoma. 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 I assured her that this does not look anything like melanoma. Except for the petechial reaction it does look like a venous insufficiency area and she certainly has evidence of this on both sides 2/26; a difficult area on the left medial ankle. The patient clearly has chronic venous hypertension with some degree of lymphedema. The odd thing about the area is the small petechial hemorrhages. I am not  really sure how to explain this. This was present last time and this is not a compression injury. We have been using Hydrofera Blue which I changed to last week 3/4; still using Hydrofera Blue. Aggressive debridement today. She does not have known arterial issues. She has seen Dr. Kellie Simmering at [facility] vein and vascular and and has an ablation on the left. [Anterior accessory branch of the greater saphenous]. From what I remember they did not feel she had an arterial issue. The patient has had this area biopsied in 2009 at [facility] dermatology and by her recollection they said this was "stasis". She is also follow-up with dermatology locally who thought that this was more of a vascular issue 3/11; using Hydrofera Blue. Aggressive debridement today. She does not have an arterial issue. We are using 3 layer compression although we may need to go to 4. The patient has been in for multiple changes to her wrap since I last saw her a week ago. She says that the area was leaking. I do not have too much more information on what was found 01/19/19 on evaluation today patient was actually being seen for a nurse visit when unfortunately she had the area on her left lateral lower extremity as well as weeping from the right lower extremity that became apparent. Therefore we did end up actually seeing her for a full visit with myself. She is having some pain at this site as well but fortunately nothing too significant at this point. No fevers, chills, nausea, or vomiting noted at this time. 3/18-Patient is back to the clinic with the left leg venous leg ulcer, the ulcer is larger in size, has a surface that is densely adherent with fibrinous tissue, the Hydrofera Blue was used but is densely adherent and there was difficulty in removing it. The right lower extremity was also wrapped for weeping edema. Patient has a new area over the left lateral foot above the malleolus that is small and appears to have no debris  with intact surrounding skin. Patient is on increased dose of Lasix also as a means to edema management 3/25; the patient has a nonhealing venous ulcer on the medial left leg and last week developed a smaller area on the lateral left calf. We have been using Hydrofera Blue with a contact layer. 4/1; no major change in these wounds areas. Left medial and more recently left lateral calf. I tried Iodoflex last week to aid in debridement  she did not tolerate this. She stated her pain was terrible all week. She took the top layer of the 4 layer compression off. 4/8; the patient actually looks somewhat better in terms of her more prominent left lateral calf wound. There is some healthy looking tissue here. She is still complaining of a lot of discomfort. 4/15; patient in a lot of pain secondary to sciatica. She is on a prednisone taper prescribed by her primary physician. She has the 2 areas one on the left medial and more recently a smaller area on the left lateral calf. Both of these just above the malleoli 4/22; her back pain is better but she still states she is very uncomfortable and now feels she is intolerant to the [employer]. No real change in the wounds we have been using Sorbact. She has been previously intolerant to Iodoflex. There is not a lot of option about what we can use to debride this wound under compression that she no doubt needs. sHe states Ultram no longer works for her pain 4/29; no major change in the wounds slightly increased depth. Surface on the original medial wound perhaps somewhat improved however the more recent area on the lateral left ankle is 100% covered in very adherent debris we have been using Sorbact. She tolerates 4 layer compression well and her edema control is a lot better. She has not had to come in for a nurse check 5/6; no major change in the condition of the wounds. She did consent to debridement today which was done with some difficulty. Continuing Sorbact.  She did not tolerate Iodoflex. She was in for a check of her compression the day after we wrapped her last week this was adjusted but nothing much was found 5/13; no major change in the condition or area of the wounds. I was able to get a fairly aggressive debridement done on the lateral left leg wound. Even using Sorbact under compression. She came back in on Friday to have the wrap changed. She says she felt uncomfortable on the [name], [name]. ([id number]) lateral aspect of her ankle. She has a long history of chronic venous insufficiency including previous ablation surgery on this side. 5/20-Patient returns for wounds on left leg with both wounds covered in slough, with the lateral leg wound larger in size, she has been in 3 layer compression and felt more comfortable, she describes pain in ankle, in leg and pins and needles in foot, and is about to try Pamelor for this 6/3; wounds on the left lateral and left medial leg. The area medially which is the most recent of the 2 seems to have had the largest increase in dimensions. We have been using Sorbac to try and debride the surface. She has been to see orthopedics they apparently did a plain x-ray that was indeterminant. Diagnosed her with neuropathy and they have ordered an MRI to determine if there is underlying osteomyelitis. This was not high on my thought list but I suppose it is prudent. We have advised her to make an appointment with vein and vascular in [place]. She has a history of a left greater saphenous and accessory vein ablations I wonder if there is anything else that can be done from a surgical point of view to help in these difficult refractory wounds. We have previously healed this wound on one occasion but it keeps on reopening [medial side] 6/10; deep tissue culture I did last week I think on the left medial wound showed  both moderate E. coli and moderate staph aureus [MSSA]. She is going to require antibiotics and I have  chosen Augmentin. We have been using Sorbact and we have made better looking wound surface on both sides but certainly no improvement in wound area. She was back in last Friday apparently for a dressing changes the wrap was hurting her outer left ankle. She has not managed to get a hold of vein and vascular in [place]. We are going to have to make her that appointment 6/17; patient is tolerating the Augmentin. She had an MRI that I think was ordered by orthopedic surgeon this did not show osteomyelitis or an abscess did suggest cellulitis. We have been using Sorbact to the lateral and medial ankles. We have been trying to arrange a follow-up appointment with vein and vascular in [place] or did her original ablations. We apparently an area sent the request to vein and vascular in [facility] 6/24; patient has completed the Augmentin. We do not yet have a vein and vascular appointment in [place]. I am not sure what the issue is here we have asked her to call tomorrow. We are using Sorbact. Making some improvements and especially the medial wound. Both surfaces however look better medial and lateral. 7/1; the patient has been in contact with vein and vascular in [place] but has not yet received an appointment. Using Sorbact we have gradually improve the wound surface with no improvement in surface area. She is approved for Apligraf but the wound surface still is not completely viable. She has not had to come in for a dressing change 7/8; the patient has an appointment with vein and vascular on 7/31 which is a Friday afternoon. She is concerned about getting back here for [place] to dress her wounds. I think it is important to have them goal for her venous reflux/history of ablations etc. to see if anything else can be done. She apparently tested positive for 1 of the blood tests with regards to lupus and saw a rheumatologist. He has raised the issue of vasculitis again. I have had this thought in  the past however the evidence seems overwhelming that this is a venous reflux etiology. If the rheumatologist tells me there is clinical and laboratory investigation is positive for lupus I will rethink this. 7/15; the patient's wound surfaces are quite a bit better. The medial area which was her original wound now has no depth although the lateral wound which was the more recent area actually appears larger. Both with viable surfaces which is indeed better. Using Sorbact. I wanted to use Apligraf on her however there is the issue of the vein and vascular appointment on 7/31 at 2:00 in the afternoon which would not allow her to get back to be rewrapped and they would no doubt remove the graft 7/22; the patient's wound surfaces have moderate amount of debris although generally look better. The lateral one is larger with 2 small satellite areas superiorly. We are waiting for her vein and vascular appointment on 7/31. She has been approved for Apligraf which I would like to use after th 7/29; wound surfaces have improved no debridement is required we have been using Sorbact. She sees vein and vascular on Friday with this so question of whether anything can be done to lessen the likelihood of recurrence and/or speed the healing of these areas. She is already had previous ablations. She no doubt has severe venous hypertension 8/5-Patient returns at 1 week, she was in [employer]  for 3 days by her podiatrist, we have been using so backed to the wound, she has increased pain in both the wounds on the left lower leg especially the more distal one on the lateral aspect 8/12-Patient returns at 1 week and she is agreeable to having debridement in both wounds on her left leg today. We have been using Sorbact, and vascular studies were reviewed at last visit 8/19; the patient arrives with her wounds fairly clean and no debridement is required. We have used Sorbact which is really done a nice job in cleaning up  these very difficult wound surfaces. The patient saw Dr. Donzetta Matters of vascular surgery on 7/31. He did not feel that there was an arterial component. He felt that her treated greater saphenous vein is adequately addressed and that the small saphenous vein did not appear to be involved significantly. She was also noted to have deep venous reflux which is not treatable. Dr. Donzetta Matters mentioned the possibility of a central obstructive component leading to reflux and he offered her central venography. She wanted to discuss this or think about it. I have urged her to go ahead with this. She has had recurrent difficult wounds in these areas which do heal but after months in the clinic. If there is anything that can be done to reduce the likelihood of this I think it is worth it. 9/2 she is still working towards getting follow-up with Dr. Donzetta Matters to schedule her CT. Things are quite a bit worse venography. I put Apligraf on 2 weeks ago on both wounds on the medial and lateral part of her left lower leg. She arrives in clinic today with 3 superficial additional wounds above the area laterally and one below the wound medially. She describes a lot of discomfort. I think these are probably wrapped injuries. Does not look like she has cellulitis. 07/20/2019 on evaluation today patient appears to be doing somewhat poorly in regard to her lower extremity ulcers. She in fact showed signs of erythema in fact we may even be dealing with an infection at this time. Unfortunately I am unsure if this is just infection or if indeed there may be some allergic reaction that occurred as a result of the Apligraf application. With that being said that would be unusual but nonetheless not impossible in this patient is one who is unfortunately allergic to quite a bit. Currently we have been using the Sorbact which seems to do as well as anything for her. I do think we may want to obtain a culture today to see if there is anything showing up  there that may need to be addressed. 9/16; noted that last week the wounds look worse in 1 week follow-up of the Apligraf. Using Sorbact as of 2 days ago. She arrives with copious amounts of drainage and new skin breakdown on the back of the left calf. The wounds arm more substantial bilaterally. There is a fair amount of swelling in the left calf no overt DVT there is edema present I think in the left greater than right thigh. She is supposed to go on 9/28 for CT venography. The wounds on the medial and lateral calf are worse and she has new skin breakdown posteriorly at least new for me. This is almost developing into a circumferential wound area The Apligraf was taken off last week which I agree with things are not going in the right direction a culture was done we do not have that back yet. She is  on Augmentin that she started 2 days ago 9/23; dressing was changed by her nurses on Monday. In general there is no improvement in the wound areas although the area looks less angry than last week. She did get Augmentin for MSSA cultured on the 14th. She still appears to have too much swelling in the left leg even with 3 layer compression 9/30; the patient underwent her procedure on 9/28 by Dr. Donzetta Matters at [facility]. She was discovered to have the common iliac vein measuring 12.2 mm but at the level of L4-L5 measured 3 mm. After stenting it measured 10 mm. 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 Rouleaux flow in the common femoral and femoral vein was observed much improved after stenting. We are using silver alginate to the wounds on the medial and lateral ankle on the left. 4 layer compression 10/7; the patient had fluid swelling around her knee and 4 layer compression. At the advice of vein and vascular this was reduced to 3 layer which she is tolerating better. We have been using silver alginate under 3 layer compression since last Friday 10/14; arrives with the  areas on the left ankle looking a lot better. Inflammation in the area also a lot better. She came in for a nurse check on [name], [name] ([id number]) 10/9 10/21; continued nice improvement. Slight improvements in surface area of both the medial and lateral wounds on the left. A lot of the satellite lesions in the weeping erythema around these from stasis dermatitis is resolved. We have been using silver alginate 10/28; general improvement in the entire wound areas although not a lot of change in dimensions the wound certainly looks better. There is a lot less in terms of venous inflammation. Continue silver alginate this week however look towards Hydrofera Blue next week 11/4; very adherent debris on the medial wound left wound is not as bad. We have been using silver alginate. Change to [facility] today 11/11; very adherent debris on both wound areas. She went to vein and vascular last week and follow-up they put in [place] boot on this today. He says the [facility] was adherent. Wound is definitely not as good as last week. Especially on the left there the satellite lesions look more prominent 11/18; absolutely no better. erythema on lateral aspect with tenderness. 09/30/2019 on evaluation today patient appears to actually be doing better. Dr. Dellia Nims did put her on doxycycline last week which I do believe has helped her at this point. Fortunately there is no signs of active infection at this time. No fevers, chills, nausea, vomiting, or diarrhea. I do believe he may want extend the doxycycline for 7 additional days just to ensure everything does completely cleared up the patient is in agreement with that plan. Otherwise she is going require some sharp debridement today 12/2; patient is completing a 2-week course of doxycycline. I gave her this empirically for inflammation as well as infection when I last saw her 2 weeks ago. All of this seems to be better. She is using silver alginate she  has the area on the medial aspect of the larger area laterally and the 2 small satellite regions laterally above the major wound. 12/9; the patient's wound on the left medial and left lateral calf look really quite good. We have been using silver alginate. She saw vein and vascular in follow-up on 10/09/2019. She has had a previous left greater saphenous vein ablation by Dr. Oscar La in 2016.  More recently she underwent a left common iliac vein stent by Dr. 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 The swelling is improved and certainly the wounds have improved. The patient shows [place] today area on the right medial calf there is almost no wound but leaking lymphedema. She says she start this started 3 or 4 days ago. She did not traumatize it. It is not painful. She does not wear compression on that side 12/16; the patient continues to do well laterally. Medially still requiring debridement. The area on the right calf did not materialize to anything and is not currently open. We wrapped this last time. She has support stockings for that leg although I am not sure they are going to provide adequate compression 12/23; the lateral wound looks stable. Medially still requiring debridement for tightly adherent fibrinous debris. We've been using silver alginate. Surface area not any different 12/30; neither wound is any better with regards to surface and the area on the left lateral is larger. I been using silver alginate to the left lateral which look quite good last week and Sorbact to the left medial 11/11/2019. Lateral wound area actually looks better and somewhat smaller. Medial still requires a very aggressive debridement today. We have been using Sorbact on both wound areas 1/13; not much better still adherent debris bilaterally. I been using Sorbact. She has severe venous hypertension. Probably some degree of dermal fibrosis distally. I wonder whether tighter compression might help and I am going  to try that today. We also need to work on the bioburden 1/20; using Sorbact. She has severe venous hypertension status post stent placement for pelvic vein compression. We applied gentamicin last time to see if we could reduce bioburden I had some discussion with her today about the use of pentoxifylline. This is occasionally used in this setting for wounds with refractory venous insufficiency. However this interacts with Plavix. She tells me that she was put on this after stent placement for 3 months. She will call Dr. Claretha Cooper office to discuss 1/27; we are using gentamicin under Sorbact. 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 She has a stent. Wound medially is measuring smaller this week. Laterally measuring slightly larger although she has some satellite lesions superiorly 2/3; gentamicin under Sorbact under 4-layer compression. 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 She has a stent on Plavix. Her wounds are measuring smaller this week. More substantially laterally where there is a satellite lesion superiorly. 2/10; gentamicin under Sorbac. 4-layer compression. Patient communicated with Dr. Donzetta Matters at vein and vascular in [place]. He is okay with the patient coming off Plavix I will therefore start her on pentoxifylline for a 1 month trial. In general her wounds look better today. I had some concerns about swelling in the left thigh however she measures 61.5 on the right and 63 on the mid thigh which does not suggest there is any difficulty. The patient is not describing any pain. 2/17; gentamicin under Sorbac 4-layer compression. She has been on pentoxifylline for 1 week and complains of loose stool. No nausea she is eating and drinking well 2/24; the patient apparently came in 2 days ago for a nurse visit when her wrap fell down. Both areas look a little worse this week macerated medially and satellite lesions laterally. Change to  silver alginate today 3/3; wounds are larger today especially medially. She also has more swelling in her foot lower leg and I even noted some swelling in  her posterior thigh which is tender. I wonder about the patency of her stent. Fortuitously she sees Dr. Claretha Cooper group on Friday 3/10; Mrs. Mastrogiovanni was seen by vein and vascular on 3/5. The patient underwent ultrasound. There was no evidence of thrombosis involving the IVC no evidence of thrombosis involving the right common iliac vein there is no evidence of thrombosis involving the right external iliac vein the left external vein is also patent. The right common iliac vein stent appears patent bilateral common femoral veins are compressible and appear patent. I was concerned about the left common iliac stent however it looks like this is functional. She has some edema in the posterior thigh that was tender she still has that this week. I also note they had trouble finding the pulses in her left foot and booked her for an ABI baseline in 4 weeks. She will follow up in 6 months for repeat IVC duplex. The patient stopped the pentoxifylline because of diarrhea. It does not look like that was being effective in any case. I have advised her to go back on her aspirin 81 mg tablet, vascular it also suggested this 3/17; comes in today with her wound surfaces a lot better. The excoriations from last week considerably better probably secondary to the TCA. We have been using silver alginate 3/24; comes in today with smaller wounds both medially and laterally. Both required debridement. There are 2 small satellite areas superiorly laterally. She also has a very odd bandlike area in the mid calf almost looking like there was a weakness in the wrap in a localized area. I would write this off as being this however anteriorly she has a small raised ballotable area that is very tender almost reminiscent of an abscess but there was no obvious purulent surface to  it. 02/04/20 upon evaluation today patient appears to be doing fairly well in regard to her wounds today. Fortunately there is no signs of active [name], [name]. ([id number]) infection at this time. No fevers, chills, nausea, vomiting, or diarrhea. She has been tolerating the dressing changes without complication. Fortunately I feel like she is showing signs of improvement although has been sometime since have seen her. Nonetheless the area of concern that Dr. Dellia Nims had last week where she had possibly an area of the wrap that was we can allow the leg to bulge appears to be doing significantly better today there is no signs of anything worsening. 4/7; the patient's wounds on her medial and lateral left leg continue to contract. We have been using a regular alginate. Last week she developed an area on the right medial lower leg which is probably a venous ulcer as well. 4/14; the wounds on her left medial and lateral lower leg continue to contract. Surface eschar. We have been using regular alginate. The area on the right medial lower leg is closed. We have been putting both legs under 4-layer contraction. The patient went back to see vein and vascular she had arterial studies done which were apparently "quite good" per the patient although I have not read their notes I have never felt she had an arterial issue. 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 This is been longstanding and refractory to exercise, leg elevation and longstanding use of compression wraps in our clinic as well as compression stockings on the times we have been able to get these to heal 4/21; we thought she actually might be close this week however she arrives in  clinic with a lot of edema in her upper left calf and into her posterior thigh. This is been an intermittent problem here. She says the wrap fell down but it was replaced with a nurse visit on Monday. We are using calcium  alginate to the wounds and the wound sizes there not terribly larger than last week but there is a lot more edema 4/28; again wound edges are smaller on both sides. Her edema is better controlled than last time. She is obtained her compression pumps from [employer] although they have not been to her home to set these up. 5/5; left medial and left lateral both look stable. I am not sure the medial is any smaller. We have been using calcium alginate under 4-layer compression. oShe had an area on the right medial. This was eschared today. We have been wrapping this as well. She does not tolerate external compression stockings due to a history of various contact allergies. She has her compression pumps however the representative from the company is coming on her to show her how to use these tomorrow 5/19; patient with severe chronic venous insufficiency secondary to central venous disease. She had a stent placed in her left common iliac vein. She has done better since but still difficult to control wounds. She comes in today with nothing open on the right leg. Her areas on the left medial and left lateral are just about closed. We are using calcium alginate under 4-layer compression. She is using her external compression pumps at home She only has 15-20 support stockings. States she cannot get anything tighter than that on. 03/30/20-Patient returns at 1 week, the wounds on the left leg are both slightly bigger, the last week she was on 3 layer compression which started to slide down. She is starting to use her lymphedema pumps although she stated on 1 day her right ankle started to swell up and she have to stop that day. Unfortunately the open area seem to oscillate between improving to the point of healing and then flaring up all to do with effectiveness of compression or lack of due to the left leg topography not keeping the compression wraps from rolling down 6/2 patient comes in with a 15/20  mmHg stocking on the right leg. She tells me that she developed a lot of swelling in her ankles she saw orthopedics she was felt to possibly be having a flare of pseudogout versus some other type of arthritis. She was put on steroids for a respiratory issue so that helps with the inflammation. She has not been using the pumps all week. She thinks the left thigh is more swollen than usual and I would agree with that. She has an appointment with Dr. Donzetta Matters 9 days or so from now 6/9; both wounds on the left medial and left lateral are smaller. We have been using calcium alginate under compression. She does not have an open wound on the right leg she is using a stocking and her compression pumps things are going well. She has an appointment with Dr. Donzetta Matters with regards to her stent in the left common iliac vein 6/16; the wounds on the left medial and left lateral ankle continues to contract. The patient saw Dr. Donzetta Matters and I think he seems satisfied. Ordered follow-up venous reflux studies on both sides in September. Cautioned that she may need thigh-high stockings. She has been using calcium alginate under compression on the left and her own stocking on the right leg.  She tells [place] there are no open wounds on the right 6/23; left lateral is just about closed. Medial required debridement today. We have been using calcium alginate. Extensive discussion about the compression pumps she is only using these on 25 mmHg states she could not take 40 or 30 when the wrap came out to her home to demonstrate these. He said they should not feel tight 6/30; the left lateral wound has a slight amount of eschar. . The area medially is about the same using Hydrofera Blue. 7/7; left lateral wound still has some eschar. I will remove this next week may be closed. The area medially is very small using Hydrofera Blue with improvement. Unfortunately the stockings fell down. Unfortunately the blisters have developed at the edge of  where the wrap fell. When this happened she says her legs hurt she did not use her pumps. We are not open Monday for her to come in and change the wraps and she had an appointment yesterday. She also tells me that she is going to have an MRI of her back. She is having pain radiating into her left anterior leg she thinks her from an L5 disc. She saw Dr. Ellene Route of neurosurgery 7/14; the area on the left lateral ankle area is closed. Still a small area medially however it looks better as well. We have been using Hydrofera Blue under 4-layer compression 7/21; left lateral ankle is still closed however her wound on the medial left calf is actually larger. This is probably because Hydrofera Blue got stuck to the wound. She came in for a nurse change on Friday and will do that again this week I was concerned about the amount of swelling that she had last week however she is using her compression pumps twice a day and the swelling seems well controlled 7/28; remaining wound on the left medial lower leg is smaller. We have been using moistened silver collagen under compression she is coming back for a nurse visit. For reasons that were not really clear she was just keeping her legs elevated and not using her compression pumps. I have asked her to use the compression pumps. She does not have any wounds on the right leg 06/15/20-Patient returns at 2 weeks, her LLE edema is worse and she developed a blister wound that is new and has bigger posterior calf wound on right, we are using Prisma with pad, 4 layer compression. she has been on lasix 40 mg daily 8/18; patient arrives today with things a lot worse than I remember from a few weeks ago. She was seen last week. Noted that her edema was worse and that she now had a left lateral wound as well as deteriorating edema in the medial and posterior part of the lower leg. She says she is using his or her external compression pumps once a day although I wonder about  the compliance. 8/25; weeping area on the right medial lower leg. This had actually gotten a small localized area of her compression stocking wet. oOn the left side there is a large denuded area on the posterior medial lower leg and smaller area on the lateral. This was not the original areas that we dealt with. 9/1 the patient's wound on the left leg include the left lateral and left posterior. Larger superficial wounds weeping. She has very poor edema control. Tender localized edema in the left lower medial ankle/heel probably because of localized wrap issues. She freely admits she is not using the  compression pumps. She has been up on her feet a lot. She thinks the hydrofera blue is contributing to the pain she is experiencing.. This is a complaint that I have occasionally heard 9/8; really not much improvement. The patient is still complaining of a lot of pain particularly when she uses compression pumps. I switched her JEANIA, NATER ([id number]) to silver alginate last time because she found the Hydrofera Blue to be irritating. I don't hear much difference in her description with the silver alginate. 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 She has follow-up with Dr. Donzetta Matters I think for ultrasound next month Electronic Signature(s) Signed: 07/13/2020 3:44:02 PM By: Linton Ham MD Entered By: Linton Ham on 07/13/2020 13:31:02 Slimp, [name] ([id number]) -------------------------------------------------------------------------------- Physical Exam Details Patient Name: STAPHANY, DITTON. Date of Service: 07/13/2020 12:30 PM Medical Record Number: [id number] Patient Account Number: [id number] Date of Birth/Sex: [birth year] (74 y.o. F) Treating RN: Cornell Barman Primary Care Provider: [name] Other Clinician: Referring Provider: [name] Treating Provider/Extender: Tito Dine in  Treatment: 7 Constitutional Patient is hypertensive.. Pulse regular and within target range for patient.Marland Kitchen Respirations regular, non-labored and within target range.. Temperature is normal and within the target range for the patient.Marland Kitchen appears in no distress. Cardiovascular pedal pulses are palpable. Lymphedemalymphedema poorly controlled in the upper mid calf. Notes Wound exam; again no improvement this week. Wounds are similar in both sides. Superficial. Not particularly ominous looking. No debridement but truthfully I don't think she can stand a debridement. She barely can stand me touching areas that don't have an open wound. She does not have extensive edema in her thigh although there are dilated varicosities Electronic Signature(s) Signed: 07/13/2020 3:44:02 PM By: Linton Ham MD Entered By: Linton Ham on 07/13/2020 13:34:03 Zayed, [name] ([id number]) -------------------------------------------------------------------------------- Physician Orders Details Patient Name: AIYANNA, AWTREY. Date of Service: 07/13/2020 12:30 PM Medical Record Number: [id number] Patient Account Number: [id number] Date of Birth/Sex: [birth year] (74 y.o. F) Treating RN: Cornell Barman Primary Care Provider: [name] Other Clinician: Referring Provider: [name] Treating Provider/Extender: Tito Dine in Treatment: 43 Verbal / Phone Orders: No Diagnosis Coding Anesthetic (add to Medication List) Wound #12 Left,Lateral Lower Leg o Topical Lidocaine 4% cream applied to wound bed prior to debridement (In Clinic Only). Wound #5 Left,Medial Lower Leg o Topical Lidocaine 4% cream applied to wound bed prior to debridement (In Clinic Only). Primary Wound Dressing Wound #12 Left,Lateral Lower Leg o Silver Alginate Wound #5 Left,Medial Lower Leg o Silver Alginate Secondary Dressing Wound #12 Left,Lateral Lower Leg o ABD pad Wound #5 Left,Medial Lower Leg o ABD  pad Dressing Change Frequency Wound #12 Left,Lateral Lower Leg o Change dressing every week o Other: - Nurse visit Friday and Monday if needed. Wound #5 Left,Medial Lower Leg o Change dressing every week o Other: - Nurse visit Friday and Monday if needed. Follow-up Appointments Wound #12 Left,Lateral Lower Leg o Return Appointment in 1 week. o Nurse Visit as needed Wound #5 Left,Medial Lower Leg o Return Appointment in 1 week. o Nurse Visit as needed Edema Control Wound #12 Left,Lateral Lower Leg o 4-Layer Compression System - Left Lower Extremity. o Patient to wear own compression stockings - Right leg o Compression Pump: Use compression pump on left lower extremity for 60 minutes, twice daily. o Compression Pump: Use compression pump on right lower extremity for 60 minutes, twice daily.  Wound #5 Left,Medial Lower Leg o 4 Layer Compression System - Bilateral o Compression Pump: Use compression pump on left lower extremity for 60 minutes, twice daily. o Compression Pump: Use compression pump on right lower extremity for 60 minutes, twice daily. Electronic Signature(s) Signed: 07/13/2020 3:44:02 PM By: Linton Ham MD Signed: 07/13/2020 6:08:23 PM By: [name] [name], RN, CWS, [name], [name] [address], [place] ([id number]) Entered By: [name], [name], RN, CWS, Kim on 07/13/2020 13:16:15 [name], [name] ([id number]) -------------------------------------------------------------------------------- Problem List Details Patient Name: [name], [name]. Date of Service: 07/13/2020 12:30 PM Medical Record Number: [id number] Patient Account Number: [id number] Date of Birth/Sex: 1946-10-29 (74 y.o. F) Treating RN: Cornell Barman Primary Care Provider: [name] Other Clinician: Referring Provider: [name] Treating Provider/Extender: Tito Dine in Treatment: 55 Active Problems ICD-10 Encounter Code Description Active Date MDM Diagnosis L97.221  Non-pressure chronic ulcer of left calf limited to breakdown of skin 01/07/2019 No Yes I89.0 Lymphedema, not elsewhere classified 12/10/2018 No Yes I87.332 Chronic venous hypertension (idiopathic) with ulcer and inflammation of 12/09/2019 No Yes left lower extremity I83.009 Varicose veins of unspecified lower extremity with ulcer of unspecified 04/13/2020 No Yes site L97.818 Non-pressure chronic ulcer of other part of right lower leg with other 10/14/2019 No Yes specified severity I82.592 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 Date of Service: 07/13/2020 12:30 PM Medical Record Number: [id number] Patient Account Number: [id number] Date of Birth/Sex: [birth year] (74 y.o. F) Treating RN: Cornell Barman Primary Care Provider: [name] Other Clinician: Referring Provider: [name] Treating Provider/Extender: Tito Dine in Treatment: 59 Subjective History of Present Illness (HPI) Pleasant 74 year old with history of chronic venous insufficiency. No diabetes or peripheral vascular disease. Left ABI 1.29. Questionable history of left lower extremity DVT. She developed a recurrent ulceration on her left lateral calf in December 2015, which she attributes to poor diet and subsequent lower extremity edema. She underwent endovenous laser ablation of her left greater saphenous vein in 2010. She underwent laser ablation of accessory branch of left GSV in  April 2016 by Dr. Kellie Simmering at [facility]. She was previously wearing Unna boots, which she tolerated well. Tolerating 2 layer compression and cadexomer iodine. She returns to clinic for follow-up and is without new complaints. She denies any significant pain at this time. She reports persistent pain with pressure. No claudication or ischemic rest pain. No fever or chills. No drainage. READMISSION 11/13/16; this is a 74 year old woman who is not a diabetic. She is here for a review of a painful area on her left medial lower extremity. I note that she was seen here previously last year for wound I believe to be in the same area. At that time she had undergone previously a left greater saphenous vein ablation by Dr. Kellie Simmering and she had a ablation of the anterior accessory branch of the left greater saphenous vein in March 2016. Seeing that the wound actually closed over. In reviewing the history with her today the ulcer in this area has been recurrent. She describes a biopsy of this area in 2009 that only showed stasis physiology. She also has a  history of today malignant melanoma in the right shoulder for which she follows with Dr. 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 Do not see that she has ever had arterial studies in the left leg. She tells me she has a follow-up with Dr. Kellie Simmering in roughly 10 days In any case she developed the reopening of this area roughly a month ago. On the background of this she describes rapidly increasing edema which has responded to Lasix 40 mg and metolazone 2.5 mg as well as the patient's lymph massage. She has been told she has both venous insufficiency and lymphedema but she cannot tolerate compression stockings 11/28/16; the patient saw Dr. Kellie Simmering recently. Per the patient he did arterial Dopplers in the office that did not show evidence of  arterial insufficiency, per the patient he stated "treat this like an ordinary venous ulcer". She also saw her dermatologist Dr. Ronnald Ramp who felt that this was more of a vascular ulcer. In general things are improving although she arrives today with increasing bilateral lower extremity edema with weeping a deeper fluid through the wound on the left medial leg compatible with some degree of lymphedema 12/04/16; the patient's wound is fully epithelialized but I don't think fully healed. We will do another week of depression with Promogran and TCA however I suspect we'll be able to discharge her next week. This is a very unusual-looking wound which was initially a figure-of-eight type wound lying on its side surrounded by petechial like hemorrhage. She has had venous ablation on this side. She apparently does not have an arterial issue per Dr. Kellie Simmering. She saw her dermatologist thought it was "vascular". Patient is definitely going to need ongoing compression and I talked about this with her today she will go to elastic therapy after she leaves here next week 12/11/16; the patient's wound is not completely closed today. She has surrounding scar tissue and in further discussion with the patient it would appear that she had ulcers in this area in 2009 for a prolonged period of time ultimately requiring a punch biopsy of this area that only showed venous insufficiency. I did not previously pickup on this part of the history from the patient. 12/18/16; the patient's wound is completely epithelialized. There is no open area here. She has significant bilateral venous insufficiency with secondary lymphedema to a mild-to-moderate degree she does not have compression stockings.. She did not say anything to me when I was in the room, she told our intake nurse that she was still having pain in this area. This isn't unusual recurrent small open area. She is going to go to elastic therapy to obtain compression  stockings. 12/25/16; the patient's wound is fully epithelialized. There is no open area here. The patient describes some continued episodic discomfort in this area medial left calf. However everything looks fine and healed here. She is been to elastic therapy and caught herself 15-20 mmHg stockings, they apparently were having trouble getting 20-30 mm stockings in her size 01/22/17; this is a patient we discharged from the clinic a month ago. She has a recurrent open wound on her medial left calf. She had 15 mm support stockings. I told her I thought she needed 20-30 mm compression stockings. She tells me that she has been ill with hospitalization secondary to asthma and is been found to have severe hypokalemia likely secondary to a combination  of Lasix and metolazone. This morning she noted blistering and leaking fluid on the posterior part of her left leg. She called our intake nurse urgently and we was saw her this afternoon. She has not had any real discomfort here. I don't know that she's been wearing any stockings on this leg for at least 2-3 days. ABIs in this clinic were 1.21 on the right and 1.3 on the left. She is previously seen vascular surgery who does not think that there is a peripheral arterial issue. 01/30/17; Patient arrives with no open wound on the left leg. She has been to elastic therapy and obtained 20-72mmhg below knee stockings and she has one on the right leg today. READMISSION 02/19/18; this Turk is a now 74 year old patient we've had in this clinic perhaps 3 times before. I had last looked at her from January 07 December 2016 with an area on the medial left leg. We discharged her on 12/25/16 however she had to be readmitted on 01/22/17 with a recurrence. I have in my notes that we discharged her on 20-30 mm stockings although she tells me she was only wearing support hose because she cannot get stockings on predominantly related to her cervical spine surgery/issues. She has had  previous ablations done by vein and vascular in [place] including a great saphenous vein ablation on the left with an anterior accessory branch ablation I think both of these were in 2016. On one of the previous visit she had a biopsy noted 2009 that was negative. She is not felt to have an arterial issue. She is not a diabetic. She does have a history of obstructive sleep apnea hypertension asthma as well as chronic venous insufficiency and lymphedema. On this occasion she noted 2 dry scaly patch on her left leg. She tried to put lotion on this it didn't really help. There were 2 open areas.the patient has been seeing her primary physician from 02/05/18 through 02/14/18. She had Unna boots applied. The superior wound now on the lateral left leg has closed but she's had one wound that remains open on the lateral left leg. This is not the same spot as we dealt with in 2018. ABIs in this clinic were 1.3 bilaterally [name], [name] ([id number]) 02/26/18; patient has a small wound on the left lateral calf. Dimensions are down. She has chronic venous insufficiency and lymphedema. 03/05/18; small open area on the left lateral calf. Dimensions are down. Tightly adherent necrotic debris over the surface of the wound which was difficult to remove. Also the dressing [over collagen] stuck to the wound surface. This was removed with some difficulty as well. Change the primary dressing to Hydrofera Blue ready 03/12/18; small open area on the left lateral calf. Comes in with tightly adherent surface eschar as well as some adherent Hydrofera Blue. 03/19/18; open area on the left lateral calf. Again adherent surface eschar as well as some adherent Hydrofera Blue nonviable subcutaneous tissue. She complained of pain all week even with the reduction from 4-3 layer compression I put on last week. Also she had an increase in her ankle and calf measurements probably related to the same thing. 03/26/18; open area on the left  lateral calf. A very small open area remains here. We used silver alginate starting last week as the Hydrofera Blue seem to stick to the wound bed. In using 4-layer compression 04/02/18; the open area in the left lateral calf at some adherent slough which I removed there is no open area  here. We are able to transition her into her own compression stocking. Truthfully I think this is probably his support hose. However this does not maintain skin integrity will be limited. She cannot put over the toe compression stockings on because of neck problems hand problems etc. She is allergic to the lining layer of juxta lites. We might be forced to use extremitease stocking should this fail READMIT 11/24/2018 Patient is now a 74 year old woman who is not a diabetic. She has been in this clinic on at least 3 previous occasions largely with recurrent wounds on her left leg secondary to chronic venous insufficiency with secondary lymphedema. Her situation is complicated by inability to get stockings on and an allergy to neoprene which is apparently a component and at least juxta lites and other stockings. As a result she really has not been wearing any stockings on her legs. She tells [place] that roughly 2 or 3 weeks ago she started noticing a stinging sensation just above her ankle on the left medial aspect. She has been diagnosed with pseudogout and she wondered whether this was what she was experiencing. She tried to dress this with something she bought at the store however subsequently it pulled skin off and now she has an open wound that is not improving. She has been using Vaseline gauze with a cover bandage. She saw her primary doctor last week who put an Haematologist on her. ABIs in this clinic was 1.03 on the left 2/12; the area is on the left medial ankle. Odd-looking wound with what looks to be surface epithelialization but a multitude of small petechial openings. This clearly not closed yet. We have been  using silver alginate under 3 layer compression with TCA 2/19; the wound area did not look quite as good this week. Necrotic debris over the majority of the wound surface which required debridement. She continues to have a multitude of what looked to be small petechial openings. She reminds [place] that she had a biopsy on this initially during her first outbreak in 2015 in [facility]. She expresses concern about this being a possible melanoma. 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 I assured her that this does not look anything like melanoma. Except for the petechial reaction it does look like a venous insufficiency area and she certainly has evidence of this on both sides 2/26; a difficult area on the left medial ankle. The patient clearly has chronic venous hypertension with some degree of lymphedema. The odd thing about the area is the small petechial hemorrhages. I am not really sure how to explain this. This was present last time and this is not a compression injury. We have been using Hydrofera Blue which I changed to last week 3/4; still using Hydrofera Blue. Aggressive debridement today. She does not have known arterial issues. She has seen Dr. Kellie Simmering at [facility] vein and vascular and and has an ablation on the left. [Anterior accessory branch of the greater saphenous]. From what I remember they did not feel she had an arterial issue. The patient has had this area biopsied in 2009 at [facility] dermatology and by her recollection they said this was "stasis". She is also follow-up with dermatology locally who thought that this was more of a vascular issue 3/11; using Hydrofera Blue. Aggressive debridement today. She does not have an arterial issue. We are using 3 layer compression although we may need to  go to 4. The patient has been in for multiple changes to her wrap since I last saw her a  week ago. She says that the area was leaking. I do not have too much more information on what was found 01/19/19 on evaluation today patient was actually being seen for a nurse visit when unfortunately she had the area on her left lateral lower extremity as well as weeping from the right lower extremity that became apparent. Therefore we did end up actually seeing her for a full visit with myself. She is having some pain at this site as well but fortunately nothing too significant at this point. No fevers, chills, nausea, or vomiting noted at this time. 3/18-Patient is back to the clinic with the left leg venous leg ulcer, the ulcer is larger in size, has a surface that is densely adherent with fibrinous tissue, the Hydrofera Blue was used but is densely adherent and there was difficulty in removing it. The right lower extremity was also wrapped for weeping edema. Patient has a new area over the left lateral foot above the malleolus that is small and appears to have no debris with intact surrounding skin. Patient is on increased dose of Lasix also as a means to edema management 3/25; the patient has a nonhealing venous ulcer on the medial left leg and last week developed a smaller area on the lateral left calf. We have been using Hydrofera Blue with a contact layer. 4/1; no major change in these wounds areas. Left medial and more recently left lateral calf. I tried Iodoflex last week to aid in debridement she did not tolerate this. She stated her pain was terrible all week. She took the top layer of the 4 layer compression off. 4/8; the patient actually looks somewhat better in terms of her more prominent left lateral calf wound. There is some healthy looking tissue here. She is still complaining of a lot of discomfort. 4/15; patient in a lot of pain secondary to sciatica. She is on a prednisone taper prescribed by her primary physician. She has the 2 areas one on the left medial and more recently a  smaller area on the left lateral calf. Both of these just above the malleoli 4/22; her back pain is better but she still states she is very uncomfortable and now feels she is intolerant to the [employer]. No real change in the wounds we have been using Sorbact. She has been previously intolerant to Iodoflex. There is not a lot of option about what we can use to debride this wound under compression that she no doubt needs. sHe states Ultram no longer works for her pain 4/29; no major change in the wounds slightly increased depth. Surface on the original medial wound perhaps somewhat improved however the more recent area on the lateral left ankle is 100% covered in very adherent debris we have been using Sorbact. She tolerates 4 layer compression well and her edema control is a lot better. She has not had to come in for a nurse check 5/6; no major change in the condition of the wounds. She did consent to debridement today which was done with some difficulty. Continuing Sorbact. She did not tolerate Iodoflex. She was in for a check of her compression the day after we wrapped her last week this was adjusted but nothing much was found 5/13; no major change in the condition or area of the wounds. I was able to get a fairly aggressive debridement done on  the lateral left leg wound. Even using Sorbact under compression. She came back in on Friday to have the wrap changed. She says she felt uncomfortable on the lateral aspect of her ankle. She has a long history of chronic venous insufficiency including previous ablation surgery on this side. 5/20-Patient returns for wounds on left leg with both wounds covered in slough, with the lateral leg wound larger in size, she has been in 3 layer compression and felt more comfortable, she describes pain in ankle, in leg and pins and needles in foot, and is about to try Pamelor for this 6/3; wounds on the left lateral and left medial leg. The area medially which is the  most recent of the 2 seems to have had the largest increase in Rochelle, [name]. ([id number]) dimensions. We have been using Sorbac to try and debride the surface. She has been to see orthopedics they apparently did a plain x-ray that was indeterminant. Diagnosed her with neuropathy and they have ordered an MRI to determine if there is underlying osteomyelitis. This was not high on my thought list but I suppose it is prudent. We have advised her to make an appointment with vein and vascular in [place]. She has a history of a left greater saphenous and accessory vein ablations I wonder if there is anything else that can be done from a surgical point of view to help in these difficult refractory wounds. We have previously healed this wound on one occasion but it keeps on reopening [medial side] 6/10; deep tissue culture I did last week I think on the left medial wound showed both moderate E. coli and moderate staph aureus [MSSA]. She is going to require antibiotics and I have chosen Augmentin. We have been using Sorbact and we have made better looking wound surface on both sides but certainly no improvement in wound area. She was back in last Friday apparently for a dressing changes the wrap was hurting her outer left ankle. She has not managed to get a hold of vein and vascular in [place]. We are going to have to make her that appointment 6/17; patient is tolerating the Augmentin. She had an MRI that I think was ordered by orthopedic surgeon this did not show osteomyelitis or an abscess did suggest cellulitis. We have been using Sorbact to the lateral and medial ankles. We have been trying to arrange a follow-up appointment with vein and vascular in [place] or did her original ablations. We apparently an area sent the request to vein and vascular in [facility] 6/24; patient has completed the Augmentin. We do not yet have a vein and vascular appointment in [place]. I am not sure what the issue  is here we have asked her to call tomorrow. We are using Sorbact. Making some improvements and especially the medial wound. Both surfaces however look better medial and lateral. 7/1; the patient has been in contact with vein and vascular in [place] but has not yet received an appointment. Using Sorbact we have gradually improve the wound surface with no improvement in surface area. She is approved for Apligraf but the wound surface still is not completely viable. She has not had to come in for a dressing change 7/8; the patient has an appointment with vein and vascular on 7/31 which is a Friday afternoon. She is concerned about getting back here for [place] to dress her wounds. I think it is important to have them goal for her venous reflux/history of ablations etc. to see if  anything else can be done. She apparently tested positive for 1 of the blood tests with regards to lupus and saw a rheumatologist. He has raised the issue of vasculitis again. I have had this thought in the past however the evidence seems overwhelming that this is a venous reflux etiology. If the rheumatologist tells me there is clinical and laboratory investigation is positive for lupus I will rethink this. 7/15; the patient's wound surfaces are quite a bit better. The medial area which was her original wound now has no depth although the lateral wound which was the more recent area actually appears larger. Both with viable surfaces which is indeed better. Using Sorbact. I wanted to use Apligraf on her however there is the issue of the vein and vascular appointment on 7/31 at 2:00 in the afternoon which would not allow her to get back to be rewrapped and they would no doubt remove the graft 7/22; the patient's wound surfaces have moderate amount of debris although generally look better. The lateral one is larger with 2 small satellite areas superiorly. We are waiting for her vein and vascular appointment on 7/31. She has been  approved for Apligraf which I would like to use after th 7/29; wound surfaces have improved no debridement is required we have been using Sorbact. She sees vein and vascular on Friday with this so question of whether anything can be done to lessen the likelihood of recurrence and/or speed the healing of these areas. She is already had previous ablations. She no doubt has severe venous hypertension 8/5-Patient returns at 1 week, she was in [place] for 3 days by her podiatrist, we have been using so backed to the wound, she has increased pain in both the wounds on the left lower leg especially the more distal one on the lateral aspect 8/12-Patient returns at 1 week and she is agreeable to having debridement in both wounds on her left leg today. We have been using Sorbact, and vascular studies were reviewed at last visit 8/19; the patient arrives with her wounds fairly clean and no debridement is required. We have used Sorbact which is really done a nice job in cleaning up these very difficult wound surfaces. The patient saw Dr. Donzetta Matters of vascular surgery on 7/31. He did not feel that there was an arterial component. He felt that her treated greater saphenous vein is adequately addressed and that the small saphenous vein did not appear to be involved significantly. She was also noted to have deep venous reflux which is not treatable. Dr. Donzetta Matters mentioned the possibility of a central obstructive component leading to reflux and he offered her central venography. She wanted to discuss this or think about it. I have urged her to go ahead with this. She has had recurrent difficult wounds in these areas which do heal but after months in the clinic. If there is anything that can be done to reduce the likelihood of this I think it is worth it. 9/2 she is still working towards getting follow-up with Dr. Donzetta Matters to schedule her CT. Things are quite a bit worse venography. I put Apligraf on 2 weeks ago on both wounds  on the medial and lateral part of her left lower leg. She arrives in clinic today with 3 superficial additional wounds above the area laterally and one below the wound medially. She describes a lot of discomfort. I think these are probably wrapped injuries. Does not look like she has cellulitis. 07/20/2019 on evaluation today  patient appears to be doing somewhat poorly in regard to her lower extremity ulcers. She in fact showed signs of erythema in fact we may even be dealing with an infection at this time. Unfortunately I am unsure if this is just infection or if indeed there may be some allergic reaction that occurred as a result of the Apligraf application. With that being said that would be unusual but nonetheless not impossible in this patient is one who is unfortunately allergic to quite a bit. Currently we have been using the Sorbact which seems to do as well as anything for her. I do think we may want to obtain a culture today to see if there is anything showing up there that may need to be addressed. 9/16; noted that last week the wounds look worse in 1 week follow-up of the Apligraf. Using Sorbact as of 2 days ago. She arrives with copious amounts of drainage and new skin breakdown on the back of the left calf. The wounds arm more substantial bilaterally. There is a fair amount of swelling in the left calf no overt DVT there is edema present I think in the left greater than right thigh. She is supposed to go on 9/28 for CT venography. The wounds on the medial and lateral calf are worse and she has new skin breakdown posteriorly at least new for me. This is almost developing into a circumferential wound area The Apligraf was taken off last week which I agree with things are not going in the right direction a culture was done we do not have that back yet. She is on Augmentin that she started 2 days ago 9/23; dressing was changed by her nurses on Monday. In general there is no improvement in the  wound areas although the area looks less angry than last week. She did get Augmentin for MSSA cultured on the 14th. She still appears to have too much swelling in the left leg even with 3 layer compression 9/30; the patient underwent her procedure on 9/28 by Dr. Donzetta Matters at [facility]. She was discovered to have the common iliac vein measuring 12.2 mm but at the level of L4-L5 measured 3 mm. After stenting it measured 10 mm. 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 Rouleaux flow in the common femoral and femoral vein was observed much improved after stenting. We are using silver alginate to the wounds on the medial and lateral ankle on the left. 4 layer compression 10/7; the patient had fluid swelling around her knee and 4 layer compression. At the advice of vein and vascular this was reduced to 3 layer which she is tolerating better. We have been using silver alginate under 3 layer compression since last Friday 10/14; arrives with the areas on the left ankle looking a lot better. Inflammation in the area also a lot better. She came in for a nurse check on 10/9 10/21; continued nice improvement. Slight improvements in surface area of both the medial and lateral wounds on the left. A lot of the satellite lesions in the weeping erythema around these from stasis dermatitis is resolved. We have been using silver alginate [facility], GLASBY ([id number]) 10/28; general improvement in the entire wound areas although not a lot of change in dimensions the wound certainly looks better. There is a lot less in terms of venous inflammation. Continue silver alginate this week however look towards Hydrofera Blue next week 11/4; very adherent debris on the medial wound left  wound is not as bad. We have been using silver alginate. Change to [facility] today 11/11; very adherent debris on both wound areas. She went to vein and vascular last week and follow-up they put in [place] boot on  this today. He says the [facility] was adherent. Wound is definitely not as good as last week. Especially on the left there the satellite lesions look more prominent 11/18; absolutely no better. erythema on lateral aspect with tenderness. 09/30/2019 on evaluation today patient appears to actually be doing better. Dr. Dellia Nims did put her on doxycycline last week which I do believe has helped her at this point. Fortunately there is no signs of active infection at this time. No fevers, chills, nausea, vomiting, or diarrhea. I do believe he may want extend the doxycycline for 7 additional days just to ensure everything does completely cleared up the patient is in agreement with that plan. Otherwise she is going require some sharp debridement today 12/2; patient is completing a 2-week course of doxycycline. I gave her this empirically for inflammation as well as infection when I last saw her 2 weeks ago. All of this seems to be better. She is using silver alginate she has the area on the medial aspect of the larger area laterally and the 2 small satellite regions laterally above the major wound. 12/9; the patient's wound on the left medial and left lateral calf look really quite good. We have been using silver alginate. She saw vein and vascular in follow-up on 10/09/2019. She has had a previous left greater saphenous vein ablation by Dr. Oscar La in 2016. More recently she underwent a left common iliac vein stent by Dr. 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 The swelling is improved and certainly the wounds have improved. The patient shows [place] today area on the right medial calf there is almost no wound but leaking lymphedema. She says she start this started 3 or 4 days ago. She did not traumatize it. It is not painful. She does not wear compression on that side 12/16; the patient continues to do well laterally. Medially still requiring debridement. The area on the right calf did not  materialize to anything and is not currently open. We wrapped this last time. She has support stockings for that leg although I am not sure they are going to provide adequate compression 12/23; the lateral wound looks stable. Medially still requiring debridement for tightly adherent fibrinous debris. We've been using silver alginate. Surface area not any different 12/30; neither wound is any better with regards to surface and the area on the left lateral is larger. I been using silver alginate to the left lateral which look quite good last week and Sorbact to the left medial 11/11/2019. Lateral wound area actually looks better and somewhat smaller. Medial still requires a very aggressive debridement today. We have been using Sorbact on both wound areas 1/13; not much better still adherent debris bilaterally. I been using Sorbact. She has severe venous hypertension. Probably some degree of dermal fibrosis distally. I wonder whether tighter compression might help and I am going to try that today. We also need to work on the bioburden 1/20; using Sorbact. She has severe venous hypertension status post stent placement for pelvic vein compression. We applied gentamicin last time to see if we could reduce bioburden I had some discussion with her today about the use of pentoxifylline. This is occasionally used in this setting for wounds with refractory venous insufficiency. However  this interacts with Plavix. She tells me that she was put on this after stent placement for 3 months. She will call Dr. Claretha Cooper office to discuss 1/27; we are using gentamicin under Sorbact. 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 She has a stent. Wound medially is measuring smaller this week. Laterally measuring slightly larger although she has some satellite lesions superiorly 2/3; gentamicin under Sorbact under 4-layer compression. 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 She has a stent on Plavix. Her wounds are measuring smaller this week. More substantially laterally where there is a satellite lesion superiorly. 2/10; gentamicin under Sorbac. 4-layer compression. Patient communicated with Dr. Donzetta Matters at vein and vascular in [place]. He is okay with the patient coming off Plavix I will therefore start her on pentoxifylline for a 1 month trial. In general her wounds look better today. I had some concerns about swelling in the left thigh however she measures 61.5 on the right and 63 on the mid thigh which does not suggest there is any difficulty. The patient is not describing any pain. 2/17; gentamicin under Sorbac 4-layer compression. She has been on pentoxifylline for 1 week and complains of loose stool. No nausea she is eating and drinking well 2/24; the patient apparently came in 2 days ago for a nurse visit when her wrap fell down. Both areas look a little worse this week macerated medially and satellite lesions laterally. Change to silver alginate today 3/3; wounds are larger today especially medially. She also has more swelling in her foot lower leg and I even noted some swelling in her posterior thigh which is tender. I wonder about the patency of her stent. Fortuitously she sees Dr. Claretha Cooper group on Friday 3/10; Mrs. Najera was seen by vein and vascular on 3/5. The patient underwent ultrasound. There was no evidence of thrombosis involving the IVC no evidence of thrombosis involving the right common iliac vein there is no evidence of thrombosis involving the right external iliac vein the left external vein is also patent. The right common iliac vein stent appears patent bilateral common femoral veins are compressible and appear patent. I was concerned about the left common iliac stent however it looks like this is functional. She has some edema in the posterior thigh that was tender she still has that this week. I also note they had trouble  finding the pulses in her left foot and booked her for an ABI baseline in 4 weeks. She will follow up in 6 months for repeat IVC duplex. The patient stopped the pentoxifylline because of diarrhea. It does not look like that was being effective in any case. I have advised her to go back on her aspirin 81 mg tablet, vascular it also suggested this 3/17; comes in today with her wound surfaces a lot better. The excoriations from last week considerably better probably secondary to the TCA. We have been using silver alginate 3/24; comes in today with smaller wounds both medially and laterally. Both required debridement. There are 2 small satellite areas superiorly laterally. She also has a very odd bandlike area in the mid calf almost looking like there was a weakness in the wrap in a localized area. I would write this off as being this however anteriorly she has a small raised ballotable area that is very tender almost reminiscent of an abscess but there was no obvious purulent surface to it. 02/04/20 upon evaluation today patient appears to be doing fairly well in regard to  her wounds today. Fortunately there is no signs of active infection at this time. No fevers, chills, nausea, vomiting, or diarrhea. She has been tolerating the dressing changes without complication. Fortunately I feel like she is showing signs of improvement although has been sometime since have seen her. Nonetheless the area of concern that Dr. Dellia Nims had last week where she had possibly an area of the wrap that was we can allow the leg to bulge appears to be doing significantly better today there is no signs of anything worsening. [name], [name] ([id number]) 4/7; the patient's wounds on her medial and lateral left leg continue to contract. We have been using a regular alginate. Last week she developed an area on the right medial lower leg which is probably a venous ulcer as well. 4/14; the wounds on her left medial and lateral  lower leg continue to contract. Surface eschar. We have been using regular alginate. The area on the right medial lower leg is closed. We have been putting both legs under 4-layer contraction. The patient went back to see vein and vascular she had arterial studies done which were apparently "quite good" per the patient although I have not read their notes I have never felt she had an arterial issue. 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 This is been longstanding and refractory to exercise, leg elevation and longstanding use of compression wraps in our clinic as well as compression stockings on the times we have been able to get these to heal 4/21; we thought she actually might be close this week however she arrives in clinic with a lot of edema in her upper left calf and into her posterior thigh. This is been an intermittent problem here. She says the wrap fell down but it was replaced with a nurse visit on Monday. We are using calcium alginate to the wounds and the wound sizes there not terribly larger than last week but there is a lot more edema 4/28; again wound edges are smaller on both sides. Her edema is better controlled than last time. She is obtained her compression pumps from [employer] although they have not been to her home to set these up. 5/5; left medial and left lateral both look stable. I am not sure the medial is any smaller. We have been using calcium alginate under 4-layer compression. She had an area on the right medial. This was eschared today. We have been wrapping this as well. She does not tolerate external compression stockings due to a history of various contact allergies. She has her compression pumps however the representative from the company is coming on her to show her how to use these tomorrow 5/19; patient with severe chronic venous insufficiency secondary to central venous disease. She had a stent placed in  her left common iliac vein. She has done better since but still difficult to control wounds. She comes in today with nothing open on the right leg. Her areas on the left medial and left lateral are just about closed. We are using calcium alginate under 4-layer compression. She is using her external compression pumps at home She only has 15-20 support stockings. States she cannot get anything tighter than that on. 03/30/20-Patient returns at 1 week, the wounds on the left leg are both slightly bigger, the last week she was on 3 layer compression which started to slide down. She is starting to use her lymphedema pumps although she stated on 1 day her right  ankle started to swell up and she have to stop that day. Unfortunately the open area seem to oscillate between improving to the point of healing and then flaring up all to do with effectiveness of compression or lack of due to the left leg topography not keeping the compression wraps from rolling down 6/2 patient comes in with a 15/20 mmHg stocking on the right leg. She tells me that she developed a lot of swelling in her ankles she saw orthopedics she was felt to possibly be having a flare of pseudogout versus some other type of arthritis. She was put on steroids for a respiratory issue so that helps with the inflammation. She has not been using the pumps all week. She thinks the left thigh is more swollen than usual and I would agree with that. She has an appointment with Dr. Donzetta Matters 9 days or so from now 6/9; both wounds on the left medial and left lateral are smaller. We have been using calcium alginate under compression. She does not have an open wound on the right leg she is using a stocking and her compression pumps things are going well. She has an appointment with Dr. Donzetta Matters with regards to her stent in the left common iliac vein 6/16; the wounds on the left medial and left lateral ankle continues to contract. The patient saw Dr. Donzetta Matters and I think  he seems satisfied. Ordered follow-up venous reflux studies on both sides in September. Cautioned that she may need thigh-high stockings. She has been using calcium alginate under compression on the left and her own stocking on the right leg. She tells [place] there are no open wounds on the right 6/23; left lateral is just about closed. Medial required debridement today. We have been using calcium alginate. Extensive discussion about the compression pumps she is only using these on 25 mmHg states she could not take 40 or 30 when the wrap came out to her home to demonstrate these. He said they should not feel tight 6/30; the left lateral wound has a slight amount of eschar. . The area medially is about the same using Hydrofera Blue. 7/7; left lateral wound still has some eschar. I will remove this next week may be closed. The area medially is very small using Hydrofera Blue with improvement. Unfortunately the stockings fell down. Unfortunately the blisters have developed at the edge of where the wrap fell. When this happened she says her legs hurt she did not use her pumps. We are not open Monday for her to come in and change the wraps and she had an appointment yesterday. She also tells me that she is going to have an MRI of her back. She is having pain radiating into her left anterior leg she thinks her from an L5 disc. She saw Dr. Ellene Route of neurosurgery 7/14; the area on the left lateral ankle area is closed. Still a small area medially however it looks better as well. We have been using Hydrofera Blue under 4-layer compression 7/21; left lateral ankle is still closed however her wound on the medial left calf is actually larger. This is probably because Hydrofera Blue got stuck to the wound. She came in for a nurse change on Friday and will do that again this week I was concerned about the amount of swelling that she had last week however she is using her compression pumps twice a day and the swelling  seems well controlled 7/28; remaining wound on the left medial lower leg  is smaller. We have been using moistened silver collagen under compression she is coming back for a nurse visit. For reasons that were not really clear she was just keeping her legs elevated and not using her compression pumps. I have asked her to use the compression pumps. She does not have any wounds on the right leg 06/15/20-Patient returns at 2 weeks, her LLE edema is worse and she developed a blister wound that is new and has bigger posterior calf wound on right, we are using Prisma with pad, 4 layer compression. she has been on lasix 40 mg daily 8/18; patient arrives today with things a lot worse than I remember from a few weeks ago. She was seen last week. Noted that her edema was worse and that she now had a left lateral wound as well as deteriorating edema in the medial and posterior part of the lower leg. She says she is using his or her external compression pumps once a day although I wonder about the compliance. 8/25; weeping area on the right medial lower leg. This had actually gotten a small localized area of her compression stocking wet. On the left side there is a large denuded area on the posterior medial lower leg and smaller area on the lateral. This was not the original areas that we dealt with. 9/1 the patient's wound on the left leg include the left lateral and left posterior. Larger superficial wounds weeping. She has very poor edema control. Tender localized edema in the left lower medial ankle/heel probably because of localized wrap issues. She freely admits she is not using the compression pumps. She has been up on her feet a lot. She thinks the hydrofera blue is contributing to the pain she is experiencing.. This is a complaint that I have occasionally heard 9/8; really not much improvement. The patient is still complaining of a lot of pain particularly when she uses compression pumps. I switched  her to silver alginate last time because she found the Hydrofera Blue to be irritating. I don't hear much difference in her description with the silver alginate. 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 She has follow-up with Dr. Donzetta Matters I think for ultrasound next month [name], MAIDEN ([id number]) Objective Constitutional Patient is hypertensive.. Pulse regular and within target range for patient.Marland Kitchen Respirations regular, non-labored and within target range.. Temperature is normal and within the target range for the patient.Marland Kitchen appears in no distress. Vitals Time Taken: 12:40 PM, Height: 63 in, Weight: 224.7 lbs, BMI: 39.8, Temperature: 98.1 F, Pulse: 70 bpm, Respiratory Rate: 18 breaths/min, Blood Pressure: 157/72 mmHg. Cardiovascular pedal pulses are palpable. Lymphedemalymphedema poorly controlled in the upper mid calf. General Notes: Wound exam; again no improvement this week. Wounds are similar in both sides. Superficial. Not particularly ominous looking. No debridement but truthfully I don't think she can stand a debridement. She barely can stand me touching areas that don't have an open wound. She does not have extensive edema in her thigh although there are dilated varicosities Integumentary (Hair, Skin) Wound #12 status is Open. Original cause of wound was Gradually Appeared. The wound is located on the Left,Lateral Lower Leg. The wound measures 4.5cm length x 4.5cm width x 0.1cm depth; 15.904cm^2 area and 1.59cm^3 volume. There is Fat Layer (Subcutaneous Tissue) exposed. There is a medium amount of serosanguineous drainage noted. There is small (1-33%) red granulation within the wound bed. There  is a large (67-100%) amount of necrotic tissue within the wound bed including Adherent Slough. Wound #5 status is Open. Original cause of wound was Gradually Appeared. The wound is located on the Left,Medial Lower  Leg. The wound measures 6cm length x 5.5cm width x 0.1cm depth; 25.918cm^2 area and 2.592cm^3 volume. There is Fat Layer (Subcutaneous Tissue) exposed. There is a medium amount of serosanguineous drainage noted. There is small (1-33%) red granulation within the wound bed. There is a large (67-100%) amount of necrotic tissue within the wound bed including Adherent Slough. 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 There was a Three Layer Compression Therapy Procedure with a pre-treatment ABI of 1 by Cornell Barman, RN. Post procedure Diagnosis Wound #12: Same as Pre-Procedure Wound #5 Pre-procedure diagnosis of Wound #5 is a Lymphedema located on the Left,Medial Lower Leg . There was a Three Layer Compression Therapy Procedure with a pre-treatment ABI of 1 by Cornell Barman, RN. Post procedure Diagnosis Wound #5: Same as Pre-Procedure DASHEA, MCMULLAN ([id number]) Plan Anesthetic (add to Medication List): Wound #12 Left,Lateral Lower Leg: Topical Lidocaine 4% cream applied to wound bed prior to debridement (In Clinic Only). Wound #5 Left,Medial Lower Leg: Topical Lidocaine 4% cream applied to wound bed prior to debridement (In Clinic Only). Primary Wound Dressing: Wound #12 Left,Lateral Lower Leg: Silver Alginate Wound #5 Left,Medial Lower Leg: Silver Alginate Secondary Dressing: Wound #12 Left,Lateral Lower Leg: ABD pad Wound #5  Left,Medial Lower Leg: ABD pad Dressing Change Frequency: Wound #12 Left,Lateral Lower Leg: Change dressing every week Other: - Nurse visit Friday and Monday if needed. Wound #5 Left,Medial Lower Leg: Change dressing every week Other: - Nurse visit Friday and Monday if needed. Follow-up Appointments: Wound #12 Left,Lateral Lower Leg: Return Appointment in 1 week. Nurse Visit as needed Wound #5 Left,Medial Lower Leg: Return Appointment in 1 week. Nurse Visit as needed Edema Control: Wound #12 Left,Lateral Lower Leg: 4-Layer Compression System - Left Lower Extremity. Patient to wear own compression stockings - Right leg Compression Pump: Use compression pump on left lower extremity for 60 minutes, twice daily. Compression Pump: Use compression pump on right lower extremity for 60 minutes, twice daily. Wound #5 Left,Medial Lower Leg: 4 Layer Compression System - Bilateral Compression Pump: Use compression pump on left lower extremity for 60 minutes, twice daily. Compression Pump: Use compression pump on right lower extremity for 60 minutes, twice daily. 1 silver alginate 2 I've asked her to increased compression pump uses to at least 30 minutes but at least 2x per day 3 there is too much swelling in the leg 4 may need to ask for dr. Claretha Cooper involvement earlier Electronic Signature(s) Signed: 07/13/2020 3:44:02 PM By: Linton Ham MD Entered By: Linton Ham on 07/13/2020 13:38:37 Benn, [name] ([id number]) -------------------------------------------------------------------------------- [place] Details Patient Name: [name], [name]. Date of Service: 07/13/2020 Medical Record Number: [id number] Patient Account Number: [id number] Date of Birth/Sex: 15-Aug-1946 (74 y.o. 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 L97.818 Non-pressure chronic ulcer of other part of right lower leg with other specified severity Facility Procedures CPT4 Code: [id number] Description: (Facility Use Only) 29581LT - [place] COMPRS LWR LT LEG Modifier: Quantity: 1 Physician Procedures CPT4 Code Description: 0626948 [id number] - WC PHYS LEVEL 3 - EST PT Modifier: Quantity: 1 CPT4 Code Description: ICD-10 Diagnosis Description L97.221 Non-pressure chronic ulcer of left calf limited to breakdown of skin I87.332 Chronic venous hypertension (idiopathic) with ulcer and inflammation of left Modifier: lower extremity Quantity: Electronic Signature(s) Signed: 07/13/2020 3:44:02 PM By: Linton Ham MD Entered By: Linton Ham on 07/13/2020 13:39:06

## 2020-07-14 NOTE — Progress Notes (Signed)
TORI, [name] ([id number]) Visit Report for 07/13/2020 Arrival Information Details Patient Name: [name], [name]. Date of Service: 07/13/2020 12:30 PM Medical Record Number: [id number] Patient Account Number: [id number] Date of Birth/Sex: [name] 21, 1946 (74 y.o. F) Treating RN: Cornell Barman Primary Care Ellsie Violette: [name] Other Clinician: Referring Florentino Laabs: [name] Treating Luma Clopper/Extender: Tito Dine in Treatment: 76 Visit Information History Since Last Visit Added or deleted any medications: No Patient Arrived: Ambulatory Any new allergies or adverse reactions: No Arrival Time: 12:42 Had a fall or experienced change in No Accompanied By: self activities of daily living that may affect Transfer Assistance: None risk of falls: Patient Identification Verified: Yes Signs or symptoms of abuse/neglect since last visito No Secondary Verification Process Completed: Yes Hospitalized since last visit: No Patient Requires Transmission-Based No Implantable device outside of the clinic excluding No Precautions: cellular tissue based products placed in the center Patient Has Alerts: Yes since last visit: Patient Alerts: Patient on Blood Has Dressing in Place as Prescribed: Yes Thinner Has Compression in Place as Prescribed: Yes aspirin 81 Pain Present Now: Yes Electronic Signature(s) Signed: 07/13/2020 3:31:30 PM By: Lorine Bears RCP, RRT, CHT Entered By: Lorine Bears on 07/13/2020 12:42:50 [place], Tenna [name] ([id number]) -------------------------------------------------------------------------------- Compression Therapy Details Patient Name: [name], [name]. Date of Service: 07/13/2020 12:30 PM Medical Record Number: [id number] Patient Account Number: [id number] Date of Birth/Sex: 10-22-1946 (74 y.o. F) Treating RN: Cornell Barman Primary Care Scherrie Seneca: [name] Other Clinician: Referring Haddon Fyfe: [name] Treating  Juvia Aerts/Extender: Tito Dine in Treatment: 83 Compression Therapy Performed for Wound Assessment: Wound #12 Left,Lateral Lower Leg Performed By: Clinician Cornell Barman, RN Compression Type: Three Layer Pre Treatment ABI: 1 Post Procedure Diagnosis Same as Pre-procedure Electronic Signature(s) Signed: 07/13/2020 6:08:23 PM By: [name], [name], RN, CWS, [name], [name] Entered By: [name], [name], RN, CWS, Kim on 07/13/2020 13:17:10 [name] ([id number]) -------------------------------------------------------------------------------- Compression Therapy Details Patient Name: [name], [name]. Date of Service: 07/13/2020 12:30 PM Medical Record Number: [id number] Patient Account Number: [id number] Date of Birth/Sex: January 02, 1946 (74 y.o. F) Treating RN: Cornell Barman Primary Care Jayce Boyko: [name] Other Clinician: Referring [name] [name]: [name] Treating Brigitte Soderberg/Extender: Tito Dine in Treatment: 83 Compression Therapy Performed for Wound Assessment: Wound #5 Left,Medial Lower Leg Performed By: Clinician Cornell Barman, RN Compression Type: Three Layer Pre Treatment ABI: 1 Post Procedure Diagnosis Same as Pre-procedure Electronic Signature(s) Signed: 07/13/2020 6:08:23 PM By: [name], [name], RN, CWS, [name], [name] Entered By: [name], [name], RN, CWS, Kim on 07/13/2020 13:17:10 CAMISHA, SREY ([id number]) -------------------------------------------------------------------------------- Encounter Discharge Information Details Patient Name: [name]. Date of Service: 07/13/2020 12:30 PM Medical Record Number: [id number] Patient Account Number: [id number] Date of Birth/Sex: 1946/08/12 (74 y.o. F) Treating RN: Cornell Barman Primary Care Milanni Ayub: [name] Other Clinician: Referring Zunairah Devers: [name] Treating Maddix Kliewer/Extender: Tito Dine in Treatment: 54 Encounter Discharge Information Items Discharge Condition: Stable Ambulatory Status:  Ambulatory Discharge Destination: Home Transportation: Private Auto Accompanied By: self Schedule Follow-up Appointment: Yes Clinical Summary of Care: Electronic Signature(s) Signed: 07/13/2020 6:08:23 PM By: [name], [name], RN, CWS, [name], [name] Entered By: [name], [name], RN, CWS, Kim on 07/13/2020 13:19:30 [name] ([id number]) -------------------------------------------------------------------------------- Lower Extremity Assessment Details Patient Name: [name]. Date of Service: 07/13/2020 12:30 PM Medical Record Number: [id number] Patient Account Number: [id number] Date of Birth/Sex: February 22, 1946 (74 y.o. F) Treating RN: Cornell Barman Primary Care Sherril Shipman: [name] Other Clinician: Referring Claudius Mich: [name] Treating Marlowe Cinquemani/Extender: Linton Ham  G Weeks in Treatment: 83 Edema Assessment Assessed: [Left: Yes] [Right: No] Edema: [Left: Ye] [Right: s] Calf Left: Right: Point of Measurement: 33 cm From Medial Instep 42 cm cm Ankle Left: Right: Point of Measurement: 13 cm From Medial Instep 23 cm cm Vascular Assessment Pulses: Dorsalis Pedis Palpable: [Left:Yes] Posterior Tibial Palpable: [Left:Yes] Electronic Signature(s) Signed: 07/13/2020 6:08:23 PM By: [name], [name], RN, CWS, [name], [name] Signed: 07/14/2020 1:36:53 PM By: [name] Entered By: [name] on 07/13/2020 13:04:20 [place], Tenna [name] ([id number]) -------------------------------------------------------------------------------- Multi Wound Chart Details Patient Name: [name], [name]. Date of Service: 07/13/2020 12:30 PM Medical Record Number: [id number] Patient Account Number: [id number] Date of Birth/Sex: August 09, 1946 (74 y.o. F) Treating RN: Cornell Barman Primary Care Lennell Shanks: [name] Other Clinician: Referring Ambers Iyengar: [name] Treating Maximiano Lott/Extender: Tito Dine in Treatment: 40 Vital Signs Height(in): 63 Pulse(bpm): 66 Weight(lbs):  224.7 Blood Pressure(mmHg): 157/72 Body Mass Index(BMI): 40 Temperature(F): 98.1 Respiratory Rate(breaths/min): 18 Photos: [N/A:N/A] Wound Location: Left, Lateral Lower Leg Left, Medial Lower Leg N/A Wounding Event: Gradually Appeared Gradually Appeared N/A Primary Etiology: Venous Leg Ulcer Lymphedema N/A Comorbid History: Cataracts, Asthma, Sleep Apnea, Cataracts, Asthma, Sleep Apnea, N/A Deep Vein Thrombosis, Deep Vein Thrombosis, Hypertension, Peripheral Venous Hypertension, Peripheral Venous Disease, Osteoarthritis, Received Disease, Osteoarthritis, Received Chemotherapy, Received Radiation Chemotherapy, Received Radiation Date Acquired: 06/15/2020 11/19/2018 N/A Weeks of Treatment: 4 83 N/A Wound Status: Open Open N/A Measurements L x W x D (cm) 4.5x4.5x0.1 6x5.5x0.1 N/A Area (cm) : 15.904 25.918 N/A Volume (cm) : 1.59 2.592 N/A % Reduction in Area: -2714.90% -203.30% N/A % Reduction in Volume: -2689.50% -203.20% N/A Classification: Partial Thickness Full Thickness Without Exposed N/A Support Structures Exudate Amount: Medium Medium N/A Exudate Type: Serosanguineous Serosanguineous N/A Exudate Color: red, brown red, brown N/A Granulation Amount: Small (1-33%) Small (1-33%) N/A Granulation Quality: Red Red N/A Necrotic Amount: Large (67-100%) Large (67-100%) N/A Exposed Structures: Fat Layer (Subcutaneous Tissue): Fat Layer (Subcutaneous Tissue): N/A Yes Yes Fascia: No Fascia: No Tendon: No Tendon: No Muscle: No Muscle: No Joint: No Joint: No Bone: No Bone: No Epithelialization: Small (1-33%) N/A N/A Procedures Performed: Compression Therapy Compression Therapy N/A Treatment Notes Wound #12 (Left, Lateral Lower Leg) Notes scell, abd 4LL Hodgkin, Keirah J. ([id number]) Wound #5 (Left, Medial Lower Leg) Notes scell, abd 4LL Electronic Signature(s) Signed: 07/13/2020 3:44:02 PM By: Linton Ham MD Entered By: Linton Ham on 07/13/2020 13:29:20 [name] ([id number]) -------------------------------------------------------------------------------- Multi-Disciplinary Care Plan Details Patient Name: [name], [name]. Date of Service: 07/13/2020 12:30 PM Medical Record Number: [id number] Patient Account Number: [id number] Date of Birth/Sex: 26-Aug-1946 (74 y.o. F) Treating RN: Cornell Barman Primary Care Lizmarie Witters: [name] Other Clinician: Referring Sylva Overley: [name] Treating Edwina Grossberg/Extender: Tito Dine in Treatment: 53 Active Inactive Medication Nursing Diagnoses: Knowledge deficit related to medication safety: actual or potential Goals: Patient/caregiver will demonstrate understanding of new oral/IV medications prescribed at the [facility] (topical prescriptions are covered under the skin breakdown problem) Date Initiated: 12/16/2019 Target Resolution Date: 01/13/2020 Goal Status: Active Interventions: Assess for medication contraindications each visit where new medications are prescribed Treatment Activities: New medication prescribed at [place] : 12/16/2019 Notes: Soft Tissue Infection Nursing Diagnoses: Impaired tissue integrity Goals: Patient's soft tissue infection will resolve Date Initiated: 12/10/2018 Target Resolution Date: 01/09/2019 Goal Status: Active Interventions: Assess signs and symptoms of infection every visit Notes: Venous Leg Ulcer Nursing Diagnoses: Actual venous Insuffiency (use after diagnosis is confirmed) Goals: Patient will maintain optimal edema control Date Initiated: 12/10/2018 Target  Resolution Date: 01/09/2019 Goal Status: Active Interventions: Assess peripheral edema status every visit. Treatment Activities: Therapeutic compression applied : 12/10/2018 Notes: Wound/Skin Impairment Nursing Diagnoses: LUCILIA, YANNI ([id number]) Impaired tissue integrity Goals: Patient/caregiver will verbalize understanding of skin care regimen Date Initiated: 12/10/2018 Target  Resolution Date: 01/09/2019 Goal Status: Active Interventions: Assess ulceration(s) every visit Treatment Activities: Topical wound management initiated : 12/10/2018 Notes: Electronic Signature(s) Signed: 07/13/2020 6:08:23 PM By: [name], [name], RN, CWS, [name], [name] Entered By: [name], [name], RN, CWS, Kim on 07/13/2020 13:13:08 [name] ([id number]) -------------------------------------------------------------------------------- Pain Assessment Details Patient Name: [name], NOTTE. Date of Service: 07/13/2020 12:30 PM Medical Record Number: [id number] Patient Account Number: [id number] Date of Birth/Sex: 1946/02/01 (74 y.o. F) Treating RN: Cornell Barman Primary Care Adreana Coull: [name] Other Clinician: Referring Camilah Spillman: [name] Treating Jetty Berland/Extender: Tito Dine in Treatment: 45 Active Problems Location of Pain Severity and Description of Pain Patient Has Paino No Site Locations With Dressing Change: Yes Duration of the Pain. Constant / Intermittento Constant Rate the pain. Current Pain Level: 5 Worst Pain Level: 8 Least Pain Level: 1 Tolerable Pain Level: 4 Character of Pain Describe the Pain: Tender Pain Management and Medication Current Pain Management: Electronic Signature(s) Signed: 07/13/2020 6:08:23 PM By: [name], [name], RN, CWS, [name], [name] Signed: 07/14/2020 1:36:53 PM By: [name] Entered By: [name] on 07/13/2020 13:00:46 [place]ki, Tenna [name] ([id number]) -------------------------------------------------------------------------------- Patient/Caregiver Education Details Patient Name: GRACEANN, [name]. Date of Service: 07/13/2020 12:30 PM Medical Record Number: [id number] Patient Account Number: [id number] Date of Birth/Gender: 10-04-1946 (74 y.o. F) Treating RN: Cornell Barman Primary Care Physician: [name] Other Clinician: Referring Physician: [name] Treating Physician/Extender: Tito Dine in  Treatment: 58 Education Assessment Education Provided To: Patient Education Topics Provided Venous: Handouts: Controlling Swelling with Multilayered Compression Wraps Methods: Demonstration, Explain/Verbal Responses: State content correctly Electronic Signature(s) Signed: 07/13/2020 6:08:23 PM By: [name], [name], RN, CWS, [name], [name] Entered By: [name], [name], RN, CWS, Kim on 07/13/2020 13:18:09 [name] ([id number]) -------------------------------------------------------------------------------- Wound Assessment Details Patient Name: [name], [name]. Date of Service: 07/13/2020 12:30 PM Medical Record Number: [id number] Patient Account Number: [id number] Date of Birth/Sex: 01-Jul-1946 (74 y.o. F) Treating RN: Cornell Barman Primary Care Jonah Nestle: [name] Other Clinician: Referring Nashly Olsson: [name] Treating Eevie Lapp/Extender: Tito Dine in Treatment: 2 Wound Status Wound Number: 12 Primary Venous Leg Ulcer Etiology: Wound Location: Left, Lateral Lower Leg Wound Open Wounding Event: Gradually Appeared Status: Date Acquired: 06/15/2020 Comorbid Cataracts, Asthma, Sleep Apnea, Deep Vein Thrombosis, Weeks Of Treatment: 4 History: Hypertension, Peripheral Venous Disease, Osteoarthritis, Clustered Wound: No Received Chemotherapy, Received Radiation Photos Wound Measurements Length: (cm) 4.5 Width: (cm) 4.5 Depth: (cm) 0.1 Area: (cm) 15.904 Volume: (cm) 1.59 % Reduction in Area: -2714.9% % Reduction in Volume: -2689.5% Epithelialization: Small (1-33%) Wound Description Classification: Partial Thickness Exudate Amount: Medium Exudate Type: Serosanguineous Exudate Color: red, brown Foul Odor After Cleansing: No Slough/Fibrino No Wound Bed Granulation Amount: Small (1-33%) Exposed Structure Granulation Quality: Red Fascia Exposed: No Necrotic Amount: Large (67-100%) Fat Layer (Subcutaneous Tissue) Exposed: Yes Necrotic Quality: Adherent  Slough Tendon Exposed: No Muscle Exposed: No Joint Exposed: No Bone Exposed: No Treatment Notes Wound #12 (Left, Lateral Lower Leg) Notes scell, abd 4LL Electronic Signature(s) Signed: 07/13/2020 6:08:23 PM By: [name], [name], RN, CWS, [name], [name] [name] [place] J. ([id number]) Signed: 07/14/2020 1:36:53 PM By: [name] Entered By: [name] on 07/13/2020 13:03:06 CARELY, NAPPIER ([id number]) -------------------------------------------------------------------------------- Wound Assessment Details Patient  Name: [name], [name]. Date of Service: 07/13/2020 12:30 PM Medical Record Number: [id number] Patient Account Number: [id number] Date of Birth/Sex: 08/31/1946 (75 y.o. F) Treating RN: Cornell Barman Primary Care Charnel Giles: [name] Other Clinician: Referring Sarahelizabeth Conway: [name] Treating Roquel Burgin/Extender: Tito Dine in Treatment: 83 Wound Status Wound Number: 5 Primary Lymphedema Etiology: Wound Location: Left, Medial Lower Leg Wound Open Wounding Event: Gradually Appeared Status: Date Acquired: 11/19/2018 Comorbid Cataracts, Asthma, Sleep Apnea, Deep Vein Thrombosis, Weeks Of Treatment: 83 History: Hypertension, Peripheral Venous Disease, Osteoarthritis, Clustered Wound: No Received Chemotherapy, Received Radiation Photos Wound Measurements Length: (cm) 6 Width: (cm) 5.5 Depth: (cm) 0.1 Area: (cm) 25.918 Volume: (cm) 2.592 % Reduction in Area: -203.3% % Reduction in Volume: -203.2% Wound Description Classification: Full Thickness Without Exposed Support Structures Exudate Amount: Medium Exudate Type: Serosanguineous Exudate Color: red, brown Foul Odor After Cleansing: No Slough/Fibrino No Wound Bed Granulation Amount: Small (1-33%) Exposed Structure Granulation Quality: Red Fascia Exposed: No Necrotic Amount: Large (67-100%) Fat Layer (Subcutaneous Tissue) Exposed: Yes Necrotic Quality: Adherent Slough Tendon Exposed: No Muscle  Exposed: No Joint Exposed: No Bone Exposed: No Treatment Notes Wound #5 (Left, Medial Lower Leg) Notes scell, abd 4LL Electronic Signature(s) Signed: 07/13/2020 6:08:23 PM By: [name], [name], RN, CWS, [name], [name] [address] [place] J. ([id number]) Signed: 07/14/2020 1:36:53 PM By: [name] Entered By: [name] on 07/13/2020 13:03:45 [place], Tenna [name] ([id number]) -------------------------------------------------------------------------------- Vitals Details Patient Name: [name], [name]. Date of Service: 07/13/2020 12:30 PM Medical Record Number: [id number] Patient Account Number: [id number] Date of Birth/Sex: [birth year] (74 y.o. F) Treating RN: Cornell Barman Primary Care Shabreka Coulon: [name] Other Clinician: Referring Rc Amison: [name] Treating Kendyn Zaman/Extender: Tito Dine in Treatment: 83 Vital Signs Time Taken: 12:40 Temperature (F): 98.1 Height (in): 63 Pulse (bpm): 70 Weight (lbs): 224.7 Respiratory Rate (breaths/min): 18 Body Mass Index (BMI): 39.8 Blood Pressure (mmHg): 157/72 Reference Range: 80 - 120 mg / dl Electronic Signature(s) Signed: 07/13/2020 3:31:30 PM By: Lorine Bears RCP, RRT, CHT Entered By: Lorine Bears on 07/13/2020 12:46:20

## 2020-07-15 ENCOUNTER — Other Ambulatory Visit: Payer: Self-pay

## 2020-07-15 DIAGNOSIS — G4733 Obstructive sleep apnea (adult) (pediatric): Secondary | ICD-10-CM | POA: Diagnosis not present

## 2020-07-15 DIAGNOSIS — I872 Venous insufficiency (chronic) (peripheral): Secondary | ICD-10-CM | POA: Diagnosis not present

## 2020-07-15 DIAGNOSIS — I890 Lymphedema, not elsewhere classified: Secondary | ICD-10-CM | POA: Diagnosis not present

## 2020-07-15 DIAGNOSIS — L97818 Non-pressure chronic ulcer of other part of right lower leg with other specified severity: Secondary | ICD-10-CM | POA: Diagnosis not present

## 2020-07-15 DIAGNOSIS — L97221 Non-pressure chronic ulcer of left calf limited to breakdown of skin: Secondary | ICD-10-CM | POA: Diagnosis not present

## 2020-07-15 DIAGNOSIS — I10 Essential (primary) hypertension: Secondary | ICD-10-CM | POA: Diagnosis not present

## 2020-07-18 ENCOUNTER — Other Ambulatory Visit: Payer: Self-pay

## 2020-07-18 DIAGNOSIS — G4733 Obstructive sleep apnea (adult) (pediatric): Secondary | ICD-10-CM | POA: Diagnosis not present

## 2020-07-18 DIAGNOSIS — I872 Venous insufficiency (chronic) (peripheral): Secondary | ICD-10-CM | POA: Diagnosis not present

## 2020-07-18 DIAGNOSIS — I890 Lymphedema, not elsewhere classified: Secondary | ICD-10-CM | POA: Diagnosis not present

## 2020-07-18 DIAGNOSIS — L97221 Non-pressure chronic ulcer of left calf limited to breakdown of skin: Secondary | ICD-10-CM | POA: Diagnosis not present

## 2020-07-18 DIAGNOSIS — I10 Essential (primary) hypertension: Secondary | ICD-10-CM | POA: Diagnosis not present

## 2020-07-18 DIAGNOSIS — L97818 Non-pressure chronic ulcer of other part of right lower leg with other specified severity: Secondary | ICD-10-CM | POA: Diagnosis not present

## 2020-07-18 NOTE — Progress Notes (Signed)
MONZERAT, HANDLER ([id number]) Visit Report for 07/15/2020 Physician Orders Details Patient Name: [name], [name]. Date of Service: 07/15/2020 3:15 PM Medical Record Number: [id number] Patient Account Number: [id number] Date of Birth/Sex: [birth year] (74 y.o. F) Treating RN: Cornell Barman Primary Care Provider: [name] Other Clinician: Referring Provider: [name] Treating Provider/Extender: Melburn Hake, Ramar Nobrega Weeks in Treatment: 39 Verbal / Phone Orders: No Diagnosis Coding Wound Cleansing Wound #12 Left,Lateral Lower Leg o Dial antibacterial soap, wash wounds, rinse and pat dry prior to dressing wounds Wound #5 Left,Medial Lower Leg o Dial antibacterial soap, wash wounds, rinse and pat dry prior to dressing wounds Anesthetic (add to Medication List) Wound #12 Left,Lateral Lower Leg o Topical Lidocaine 4% cream applied to wound bed prior to debridement (In Clinic Only). - if needed. Wound #5 Left,Medial Lower Leg o Topical Lidocaine 4% cream applied to wound bed prior to debridement (In Clinic Only). - if needed. Skin Barriers/Peri-Wound Care Wound #12 Left,Lateral Lower Leg o Triamcinolone Acetonide Ointment (TCA) Wound #5 Left,Medial Lower Leg o Triamcinolone Acetonide Ointment (TCA) Primary Wound Dressing Wound #12 Left,Lateral Lower Leg o Silver Alginate Wound #5 Left,Medial Lower Leg o Silver Alginate Secondary Dressing Wound #12 Left,Lateral Lower Leg o ABD pad Wound #5 Left,Medial Lower Leg o ABD pad Dressing Change Frequency Wound #12 Left,Lateral Lower Leg o Change dressing every week o Other: - Nurse visit Friday and Monday if needed. Wound #5 Left,Medial Lower Leg o Change dressing every week o Other: - Nurse visit Friday and Monday if needed. Follow-up Appointments Wound #12 Left,Lateral Lower Leg o Return Appointment in 1 week. o Nurse Visit as needed Wound #5 Left,Medial Lower Leg o Return Appointment in 1  week. [name], [name] ([id number]) o Nurse Visit as needed Edema Control Wound #12 Left,Lateral Lower Leg o 4-Layer Compression System - Left Lower Extremity. o Patient to wear own compression stockings - Right leg o Compression Pump: Use compression pump on left lower extremity for 60 minutes, twice daily. o Compression Pump: Use compression pump on right lower extremity for 60 minutes, twice daily. Wound #5 Left,Medial Lower Leg o 4-Layer Compression System - Left Lower Extremity. o Patient to wear own compression stockings - Right leg o Compression Pump: Use compression pump on left lower extremity for 60 minutes, twice daily. o Compression Pump: Use compression pump on right lower extremity for 60 minutes, twice daily. Electronic Signature(s) Signed: 07/15/2020 5:22:53 PM By: [name], [name], RN, CWS, [name], [name] Signed: 07/18/2020 4:45:03 PM By: Worthy Keeler PA-C Entered By: [name] [name], RN, CWS, Kim on 07/15/2020 17:22:48 [name], [name] ([id number]) -------------------------------------------------------------------------------- SuperBill Details Patient Name: [name], [name]. Date of Service: 07/15/2020 Medical Record Number: [id number] Patient Account Number: [id number] Date of Birth/Sex: 12/15/1945 (74 y.o. F) Treating RN: Cornell Barman Primary Care Provider: [name] Other Clinician: Referring Provider: [name] Treating Provider/Extender: Melburn Hake, Nahshon Reich Weeks in Treatment: 83 Diagnosis Coding ICD-10 Codes Code Description L97.221 Non-pressure chronic ulcer of left calf limited to breakdown of skin I89.0 Lymphedema, not elsewhere classified I87.332 Chronic venous hypertension (idiopathic) with ulcer and inflammation of left lower extremity I83.009 Varicose veins of unspecified lower extremity with ulcer of unspecified site I82.592 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 L97.818 Non-pressure chronic  ulcer of other part of right lower leg with other specified severity Facility Procedures CPT4 Code: 33295188 Description: (Facility Use Only) 29581LT - APPLY MULTLAY COMPRS LWR LT LEG Modifier: Quantity: 1 Electronic Signature(s) Signed: 07/15/2020 5:24:11  PM By: [name], [name], RN, CWS, [name], [name] Signed: 07/18/2020 4:45:03 PM By: Worthy Keeler PA-C Entered By: [name] [name], RN, CWS, Kim on 07/15/2020 17:24:11

## 2020-07-18 NOTE — Progress Notes (Signed)
[name] ([id number]) Visit Report for 07/18/2020 Arrival Information Details Patient Name: [name], [name]. Date of Service: 07/18/2020 3:15 PM Medical Record Number: [id number] Patient Account Number: [id number] Date of Birth/Sex: 04-13-1946 (74 y.o. F) Treating RN: [name] Canavan Primary Care Justyce Yeater: [name] Other Clinician: Referring Jennika [name]: [name] Treating Madix Blowe/Extender: Melburn Hake, HOYT Weeks in Treatment: 70 Visit Information History Since Last Visit All ordered tests and consults were completed: No Patient Arrived: Ambulatory Added or deleted any medications: No Arrival Time: 15:44 Any new allergies or adverse reactions: No Accompanied By: self Had a fall or experienced change in No Transfer Assistance: None activities of daily living that may affect Patient Identification Verified: Yes risk of falls: Secondary Verification Process Completed: Yes Signs or symptoms of abuse/neglect since last visito No Patient Requires Transmission-Based No Hospitalized since last visit: No Precautions: Implantable device outside of the clinic excluding No Patient Has Alerts: Yes cellular tissue based products placed in the center Patient Alerts: Patient on Blood since last visit: Thinner Pain Present Now: No aspirin 81 Electronic Signature(s) Signed: 07/18/2020 4:40:43 PM By: [name] Entered By: [name] on 07/18/2020 15:45:02 [place], [name] ([id number]) -------------------------------------------------------------------------------- Compression Therapy Details Patient Name: [name], [name]. Date of Service: 07/18/2020 3:15 PM Medical Record Number: [id number] Patient Account Number: [id number] Date of Birth/Sex: [birth year] (74 y.o. F) Treating RN: [name] Canavan Primary Care Ranson Belluomini: [name] Other Clinician: Referring Elster Corbello: [name] Treating Kinslea Frances/Extender: Melburn Hake, HOYT Weeks in Treatment:  83 Compression Therapy Performed for Wound Assessment: Wound #12 Left,Lateral Lower Leg Performed By: Ponciano Ort, RN Compression Type: Four Layer Electronic Signature(s) Signed: 07/18/2020 4:32:44 PM By: [name] Canavan Entered By: [name] Canavan on 07/18/2020 16:22:08 [name] ([id number]) -------------------------------------------------------------------------------- Encounter Discharge Information Details Patient Name: [name], [name]. Date of Service: 07/18/2020 3:15 PM Medical Record Number: [id number] Patient Account Number: [id number] Date of Birth/Sex: [birth year] (74 y.o. F) Treating RN: [name] Canavan Primary Care Delsie Amador: [name] Other Clinician: Referring Arles Rumbold: [name] Treating Rani Sisney/Extender: Melburn Hake, HOYT Weeks in Treatment: 48 Encounter Discharge Information Items Discharge Condition: Stable Ambulatory Status: Ambulatory Discharge Destination: Home Transportation: Private Auto Accompanied By: self Schedule Follow-up Appointment: Yes Clinical Summary of Care: Electronic Signature(s) Signed: 07/18/2020 4:32:44 PM By: [name] Canavan Entered By: [name] Canavan on 07/18/2020 16:23:40 [name] ([id number]) -------------------------------------------------------------------------------- Wound Assessment Details Patient Name: [name], [name]. Date of Service: 07/18/2020 3:15 PM Medical Record Number: [id number] Patient Account Number: [id number] Date of Birth/Sex: 1946-06-25 (74 y.o. F) Treating RN: [name] Canavan Primary Care Sanel Stemmer: [name] Other Clinician: Referring Zebbie Ace: [name] Treating Merie Wulf/Extender: Melburn Hake, HOYT Weeks in Treatment: 28 Wound Status Wound Number: 12 Primary Venous Leg Ulcer Etiology: Wound Location: Left, Lateral Lower Leg Wound Open Wounding Event: Gradually Appeared Status: Date Acquired: 06/15/2020 Comorbid Cataracts, Asthma, Sleep Apnea, Deep  Vein Thrombosis, Weeks Of Treatment: 4 History: Hypertension, Peripheral Venous Disease, Osteoarthritis, Clustered Wound: No Received Chemotherapy, Received Radiation Photos Wound Measurements Length: (cm) 5.3 Width: (cm) 5 Depth: (cm) 0.1 Area: (cm) 20.813 Volume: (cm) 2.081 % Reduction in Area: -3583.7% % Reduction in Volume: -3550.9% Epithelialization: Small (1-33%) Wound Description Classification: Partial Thickness Fou Exudate Amount: Medium Slo Exudate Type: Serosanguineous Exudate Color: red, brown l Odor After Cleansing: No ugh/Fibrino No Wound Bed Granulation Amount: Small (1-33%) Exposed Structure Granulation Quality: Red Fascia Exposed: No Necrotic Amount: Large (67-100%) Fat Layer (Subcutaneous Tissue) Exposed: Yes Necrotic Quality: Adherent Slough Tendon Exposed: No Muscle Exposed: No Joint Exposed: No Bone Exposed: No  Treatment Notes Wound #12 (Left, Lateral Lower Leg) Notes scell, abd, 4LL Electronic Signature(s) Signed: 07/18/2020 4:32:44 PM By: [name], [name] ([id number]) Signed: 07/18/2020 4:40:43 PM By: [name] Entered By: [name] on 07/18/2020 15:48:50 [name] ([id number]) -------------------------------------------------------------------------------- Wound Assessment Details Patient Name: [name], [name]. Date of Service: 07/18/2020 3:15 PM Medical Record Number: [id number] Patient Account Number: [id number] Date of Birth/Sex: [birth year] (74 y.o. F) Treating RN: [name] Canavan Primary Care Lliam Hoh: [name] Other Clinician: Referring Luddie Boghosian: [name] Treating Cherika Jessie/Extender: Melburn Hake, HOYT Weeks in Treatment: 83 Wound Status Wound Number: 5 Primary Lymphedema Etiology: Wound Location: Left, Medial Lower Leg Wound Open Wounding Event: Gradually Appeared Status: Date Acquired: 11/19/2018 Comorbid Cataracts, Asthma, Sleep Apnea, Deep Vein Thrombosis, Weeks Of Treatment:  83 History: Hypertension, Peripheral Venous Disease, Osteoarthritis, Clustered Wound: No Received Chemotherapy, Received Radiation Photos Wound Measurements Length: (cm) 6 Width: (cm) 5.5 Depth: (cm) 0.1 Area: (cm) 25.918 Volume: (cm) 2.592 % Reduction in Area: -203.3% % Reduction in Volume: -203.2% Wound Description Classification: Full Thickness Without Exposed Support Structu Exudate Amount: Medium Exudate Type: Serosanguineous Exudate Color: red, brown res Foul Odor After Cleansing: No Slough/Fibrino No Wound Bed Granulation Amount: Small (1-33%) Exposed Structure Granulation Quality: Red Fascia Exposed: No Necrotic Amount: Large (67-100%) Fat Layer (Subcutaneous Tissue) Exposed: Yes Necrotic Quality: Adherent Slough Tendon Exposed: No Muscle Exposed: No Joint Exposed: No Bone Exposed: No Treatment Notes Wound #5 (Left, Medial Lower Leg) Notes scell, abd, 4LL Electronic Signature(s) Signed: 07/18/2020 4:32:44 PM By: [name], [name] ([id number]) Signed: 07/18/2020 4:40:43 PM By: [name] Entered By: [name] on 07/18/2020 15:49:32

## 2020-07-19 NOTE — Progress Notes (Signed)
[name] ([id number]) Visit Report for 07/15/2020 Arrival Information Details Patient Name: [name], [name]. Date of Service: 07/15/2020 3:15 PM Medical Record Number: [id number] Patient Account Number: [id number] Date of Birth/Sex: 04/25/1946 (74 y.o. F) Treating RN: Cornell Barman Primary Care Samarah Hogle: [name] Other Clinician: Referring Zahriah Roes: [name] Treating Laurella Tull/Extender: Melburn Hake, HOYT Weeks in Treatment: 83 Visit Information History Since Last Visit All ordered tests and consults were completed: No Patient Arrived: Ambulatory Added or deleted any medications: No Arrival Time: 15:46 Any new allergies or adverse reactions: No Accompanied By: self Had a fall or experienced change in No Transfer Assistance: None activities of daily living that may affect Patient Identification Verified: Yes risk of falls: Secondary Verification Process Completed: Yes Signs or symptoms of abuse/neglect since last visito No Patient Requires Transmission-Based No Hospitalized since last visit: No Precautions: Implantable device outside of the clinic excluding No Patient Has Alerts: Yes cellular tissue based products placed in the center Patient Alerts: Patient on Blood since last visit: Thinner Has Dressing in Place as Prescribed: Yes aspirin 81 Has Compression in Place as Prescribed: Yes Pain Present Now: No Electronic Signature(s) Signed: 07/18/2020 2:11:58 PM By: [name] Entered By: [name] on 07/15/2020 15:47:32 Parco, [name] ([id number]) -------------------------------------------------------------------------------- Compression Therapy Details Patient Name: [name]. Date of Service: 07/15/2020 3:15 PM Medical Record Number: [id number] Patient Account Number: [id number] Date of Birth/Sex: 07-13-1946 (74 y.o. F) Treating RN: Cornell Barman Primary Care Myrlene Riera: [name] Other Clinician: Referring Denman Pichardo: [name] Treating Amdrew Oboyle/Extender: Melburn Hake, HOYT Weeks in Treatment: 83 Compression Therapy Performed for Wound Assessment: Wound #12 Left,Lateral Lower Leg Performed By: Clinician Cornell Barman, RN Compression Type: Four Layer Pre Treatment ABI: 1 Electronic Signature(s) Signed: 07/15/2020 5:21:09 PM By: [name], [name], RN, CWS, [name], [name] Entered By: [name], [name], RN, CWS, Kim on 07/15/2020 17:21:08 [name] ([id number]) -------------------------------------------------------------------------------- Encounter Discharge Information Details Patient Name: [name], [name] Date of Service: 07/15/2020 3:15 PM Medical Record Number: [id number] Patient Account Number: [id number] Date of Birth/Sex: 1946/09/25 (74 y.o. F) Treating RN: Cornell Barman Primary Care Farah Benish: [name] Other Clinician: Referring Ellianah Cordy: [name] Treating Lemmie Vanlanen/Extender: Melburn Hake, HOYT Weeks in Treatment: 82 Encounter Discharge Information Items Discharge Condition: Stable Ambulatory Status: Ambulatory Discharge Destination: Home Transportation: Private Auto Accompanied By: self Schedule Follow-up Appointment: Yes Clinical Summary of Care: Electronic Signature(s) Signed: 07/15/2020 5:23:59 PM By: [name], [name], RN, CWS, [name], [name] Entered By: [name], [name], RN, CWS, Kim on 07/15/2020 17:23:59 [name] ([id number]) -------------------------------------------------------------------------------- Wound Assessment Details Patient Name: [name]. Date of Service: 07/15/2020 3:15 PM Medical Record Number: [id number] Patient Account Number: [id number] Date of Birth/Sex: [birth year] (74 y.o. F) Treating RN: Cornell Barman Primary Care [name]: [name] Other Clinician: Referring Avier Jech: [name] Treating Shakeita Vandevander/Extender: Melburn Hake, HOYT Weeks in Treatment: 83 Wound Status Wound Number: 12 Primary Venous Leg Ulcer Etiology: Wound Location: Left, Lateral Lower  Leg Wound Open Wounding Event: Gradually Appeared Status: Date Acquired: 06/15/2020 Comorbid Cataracts, Asthma, Sleep Apnea, Deep Vein Thrombosis, Weeks Of Treatment: 4 History: Hypertension, Peripheral Venous Disease, Osteoarthritis, Clustered Wound: No Received Chemotherapy, Received Radiation Photos Wound Measurements Length: (cm) 5.5 Width: (cm) 5 Depth: (cm) 0.1 Area: (cm) 21.598 Volume: (cm) 2.16 % Reduction in Area: -3722.7% % Reduction in Volume: -3689.5% Epithelialization: Small (1-33%) Wound Description Classification: Partial Thickness Exudate Amount: Medium Exudate Type: Serosanguineous Exudate Color: red, brown Foul Odor After Cleansing: No Slough/Fibrino No Wound Bed Granulation Amount: Small (1-33%) Exposed Structure  Granulation Quality: Red Fascia Exposed: No Necrotic Amount: Large (67-100%) Fat Layer (Subcutaneous Tissue) Exposed: Yes Necrotic Quality: Adherent Slough Tendon Exposed: No Muscle Exposed: No Joint Exposed: No Bone Exposed: No Electronic Signature(s) Signed: 07/18/2020 2:11:58 PM By: [name] Signed: 07/19/2020 4:28:55 PM By: [name], [name], RN, CWS, [name], [name] Entered By: [name] on 07/15/2020 15:52:20 Yoak, [name] ([id number]) -------------------------------------------------------------------------------- Wound Assessment Details Patient Name: [name]. Date of Service: 07/15/2020 3:15 PM Medical Record Number: [id number] Patient Account Number: [id number] Date of Birth/Sex: December 24, 1945 (74 y.o. F) Treating RN: Cornell Barman Primary Care Vielka Klinedinst: [name] Other Clinician: Referring Nayvie Lips: [name] Treating Kriya Westra/Extender: Melburn Hake, HOYT Weeks in Treatment: 83 Wound Status Wound Number: 5 Primary Lymphedema Etiology: Wound Location: Left, Medial Lower Leg Wound Open Wounding Event: Gradually Appeared Status: Date Acquired: 11/19/2018 Comorbid Cataracts, Asthma, Sleep Apnea, Deep Vein  Thrombosis, Weeks Of Treatment: 83 History: Hypertension, Peripheral Venous Disease, Osteoarthritis, Clustered Wound: No Received Chemotherapy, Received Radiation Photos Wound Measurements Length: (cm) 6 Width: (cm) 5.5 Depth: (cm) 0.1 Area: (cm) 25.918 Volume: (cm) 2.592 % Reduction in Area: -203.3% % Reduction in Volume: -203.2% Wound Description Classification: Full Thickness Without Exposed Support Structu Exudate Amount: Medium Exudate Type: Serosanguineous Exudate Color: red, brown res Foul Odor After Cleansing: No Slough/Fibrino No Wound Bed Granulation Amount: Small (1-33%) Exposed Structure Granulation Quality: Red Fascia Exposed: No Necrotic Amount: Large (67-100%) Fat Layer (Subcutaneous Tissue) Exposed: Yes Necrotic Quality: Adherent Slough Tendon Exposed: No Muscle Exposed: No Joint Exposed: No Bone Exposed: No Electronic Signature(s) Signed: 07/18/2020 2:11:58 PM By: [name] Signed: 07/19/2020 4:28:55 PM By: [name], [name], RN, CWS, [name], [name] Entered By: [name] on 07/15/2020 15:54:07

## 2020-07-20 ENCOUNTER — Other Ambulatory Visit: Payer: Self-pay

## 2020-07-20 ENCOUNTER — Encounter: Payer: Medicare Other | Admitting: Internal Medicine

## 2020-07-20 DIAGNOSIS — L97221 Non-pressure chronic ulcer of left calf limited to breakdown of skin: Secondary | ICD-10-CM | POA: Diagnosis not present

## 2020-07-20 DIAGNOSIS — L97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d: Secondary | ICD-10-CM | POA: Diagnosis not present

## 2020-07-20 DIAGNOSIS — I872 Venous insufficiency (chronic) (peripheral): Secondary | ICD-10-CM | POA: Diagnosis not present

## 2020-07-20 DIAGNOSIS — L97818 Non-pressure chronic ulcer of other part of right lower leg with other specified severity: Secondary | ICD-10-CM | POA: Diagnosis not present

## 2020-07-20 DIAGNOSIS — I10 Essential (primary) hypertension: Secondary | ICD-10-CM | POA: Diagnosis not present

## 2020-07-20 DIAGNOSIS — I890 Lymphedema, not elsewhere classified: Secondary | ICD-10-CM | POA: Diagnosis not present

## 2020-07-20 DIAGNOSIS — G4733 Obstructive sleep apnea (adult) (pediatric): Secondary | ICD-10-CM | POA: Diagnosis not present

## 2020-07-21 ENCOUNTER — Telehealth: Payer: Self-pay

## 2020-07-21 NOTE — Telephone Encounter (Signed)
Patient called an left a VM stating she would like to have a sooner appt with Dr. Vella Redhead to look at her Left leg. She has been having swelling and also small bumps that are tender that appear under the dressing that she would like to have evaluated before her next appt. I was unable to reach patient so I LVMTCB.

## 2020-07-22 ENCOUNTER — Other Ambulatory Visit: Payer: Self-pay

## 2020-07-22 DIAGNOSIS — I890 Lymphedema, not elsewhere classified: Secondary | ICD-10-CM | POA: Diagnosis not present

## 2020-07-22 DIAGNOSIS — L97221 Non-pressure chronic ulcer of left calf limited to breakdown of skin: Secondary | ICD-10-CM | POA: Diagnosis not present

## 2020-07-22 DIAGNOSIS — G4733 Obstructive sleep apnea (adult) (pediatric): Secondary | ICD-10-CM | POA: Diagnosis not present

## 2020-07-22 DIAGNOSIS — L97818 Non-pressure chronic ulcer of other part of right lower leg with other specified severity: Secondary | ICD-10-CM | POA: Diagnosis not present

## 2020-07-22 DIAGNOSIS — I10 Essential (primary) hypertension: Secondary | ICD-10-CM | POA: Diagnosis not present

## 2020-07-22 DIAGNOSIS — I872 Venous insufficiency (chronic) (peripheral): Secondary | ICD-10-CM | POA: Diagnosis not present

## 2020-07-22 NOTE — Progress Notes (Signed)
Amber, [name] ([id number]) Visit Report for 07/22/2020 Arrival Information Details Patient Name: [name], [name]. Date of Service: 07/22/2020 10:00 AM Medical Record Number: [id number] Patient Account Number: [id number] Date of Birth/Sex: 27-Oct-1946 (74 y.o. F) Treating RN: [name] Canavan Primary Care Artrice Kraker: [name] Other Clinician: Referring Ernie Kasler: [name] Treating Briar Sword/Extender: Melburn Hake, HOYT Weeks in Treatment: 84 Visit Information History Since Last Visit Added or deleted any medications: No Patient Arrived: Ambulatory Had a fall or experienced change in No Arrival Time: 10:05 activities of daily living that may affect Accompanied By: self risk of falls: Transfer Assistance: None Hospitalized since last visit: No Patient Requires Transmission-Based No Has Dressing in Place as Prescribed: Yes Precautions: Pain Present Now: No Patient Has Alerts: Yes Patient Alerts: Patient on Blood Thinner aspirin 81 Electronic Signature(s) Signed: 07/22/2020 4:35:13 PM By: [name] Canavan Entered By: [name] Canavan on 07/22/2020 10:06:11 [name] ([id number]) -------------------------------------------------------------------------------- Encounter Discharge Information Details Patient Name: [name], [name]. Date of Service: 07/22/2020 10:00 AM Medical Record Number: [id number] Patient Account Number: [id number] Date of Birth/Sex: 24-Sep-1946 (74 y.o. F) Treating RN: [name] Canavan Primary Care Aloys Hupfer: [name] Other Clinician: Referring Rubie Ficco: [name] Treating Mali Eppard/Extender: Melburn Hake, HOYT Weeks in Treatment: 71 Encounter Discharge Information Items Discharge Condition: Stable Ambulatory Status: Ambulatory Discharge Destination: Home Transportation: Private Auto Accompanied By: self Schedule Follow-up Appointment: Yes Clinical Summary of Care: Electronic Signature(s) Signed: 07/22/2020 4:35:13 PM By: [name] Canavan Entered By: [name] Canavan on 07/22/2020 10:07:23 [name] ([id number]) -------------------------------------------------------------------------------- Wound Assessment Details Patient Name: [name], [name]. Date of Service: 07/22/2020 10:00 AM Medical Record Number: [id number] Patient Account Number: [id number] Date of Birth/Sex: 1946/10/30 (74 y.o. F) Treating RN: [name] Canavan Primary Care Isiac Breighner: [name] Other Clinician: Referring Aishani Kalis: [name] Treating Morna Flud/Extender: Melburn Hake, HOYT Weeks in Treatment: 50 Wound Status Wound Number: 12 Primary Venous Leg Ulcer Etiology: Wound Location: Left, Lateral Lower Leg Wound Open Wounding Event: Gradually Appeared Status: Date Acquired: 06/15/2020 Comorbid Cataracts, Asthma, Sleep Apnea, Deep Vein Thrombosis, Weeks Of Treatment: 5 History: Hypertension, Peripheral Venous Disease, Osteoarthritis, Clustered Wound: No Received Chemotherapy, Received Radiation Photos Wound Measurements Length: (cm) 5.8 Width: (cm) 5.5 Depth: (cm) 0.1 Area: (cm) 25.054 Volume: (cm) 2.505 % Reduction in Area: -4334.3% % Reduction in Volume: -4294.7% Epithelialization: Small (1-33%) Wound Description Classification: Partial Thickness Fou Exudate Amount: Medium Slo Exudate Type: Serosanguineous Exudate Color: red, brown l Odor After Cleansing: No ugh/Fibrino No Wound Bed Granulation Amount: Small (1-33%) Exposed Structure Granulation Quality: Red Fascia Exposed: No Necrotic Amount: Large (67-100%) Fat Layer (Subcutaneous Tissue) Exposed: Yes Necrotic Quality: Adherent Slough Tendon Exposed: No Muscle Exposed: No Joint Exposed: No Bone Exposed: No Treatment Notes Wound #12 (Left, Lateral Lower Leg) Notes scell, abd, 4LL, Electronic Signature(s) Signed: 07/22/2020 11:44:32 AM By: Gena Fray ([id number]) Signed: 07/22/2020 4:35:13 PM By: [name] Canavan Entered By:  [name] on 07/22/2020 10:26:55 [name] ([id number]) -------------------------------------------------------------------------------- Wound Assessment Details Patient Name: [name], [name]. Date of Service: 07/22/2020 10:00 AM Medical Record Number: [id number] Patient Account Number: [id number] Date of Birth/Sex: 11/19/1945 (74 y.o. F) Treating RN: [name] Canavan Primary Care Grey Schlauch: [name] Other Clinician: Referring Bilbo Carcamo: [name] Treating Leatha Rohner/Extender: STONE III, HOYT Weeks in Treatment: 84 Wound Status Wound Number: 5 Primary Lymphedema Etiology: Wound Location: Left, Medial Lower Leg Wound Open Wounding Event: Gradually Appeared Status: Date Acquired: 11/19/2018 Comorbid Cataracts, Asthma, Sleep Apnea, Deep Vein Thrombosis, Weeks Of Treatment: 84 History: Hypertension, Peripheral Venous Disease, Osteoarthritis, Clustered  Wound: No Received Chemotherapy, Received Radiation Photos Wound Measurements Length: (cm) 6.2 Width: (cm) 5.5 Depth: (cm) 0.1 Area: (cm) 26.782 Volume: (cm) 2.678 % Reduction in Area: -213.4% % Reduction in Volume: -213.2% Wound Description Classification: Full Thickness Without Exposed Support Structu Exudate Amount: Medium Exudate Type: Serosanguineous Exudate Color: red, brown res Foul Odor After Cleansing: No Slough/Fibrino No Wound Bed Granulation Amount: Small (1-33%) Exposed Structure Granulation Quality: Red Fascia Exposed: No Necrotic Amount: Large (67-100%) Fat Layer (Subcutaneous Tissue) Exposed: Yes Necrotic Quality: Adherent Slough Tendon Exposed: No Muscle Exposed: No Joint Exposed: No Bone Exposed: No Treatment Notes Wound #5 (Left, Medial Lower Leg) Notes scell, abd, 4LL, Electronic Signature(s) Signed: 07/22/2020 11:44:32 AM By: Gena Fray ([id number]) Signed: 07/22/2020 4:35:13 PM By: [name] Canavan Entered By: [name] on 07/22/2020  10:27:22

## 2020-07-24 ENCOUNTER — Other Ambulatory Visit: Payer: Self-pay | Admitting: Family Medicine

## 2020-07-27 ENCOUNTER — Other Ambulatory Visit: Payer: Self-pay

## 2020-07-27 ENCOUNTER — Encounter: Payer: Medicare Other | Admitting: Internal Medicine

## 2020-07-27 DIAGNOSIS — L97818 Non-pressure chronic ulcer of other part of right lower leg with other specified severity: Secondary | ICD-10-CM | POA: Diagnosis not present

## 2020-07-27 DIAGNOSIS — I890 Lymphedema, not elsewhere classified: Secondary | ICD-10-CM | POA: Diagnosis not present

## 2020-07-27 DIAGNOSIS — I87333 Chronic venous hypertension (idiopathic) with ulcer and inflammation of bilateral lower extremity: Secondary | ICD-10-CM | POA: Diagnosis not present

## 2020-07-27 DIAGNOSIS — G4733 Obstructive sleep apnea (adult) (pediatric): Secondary | ICD-10-CM | POA: Diagnosis not present

## 2020-07-27 DIAGNOSIS — I10 Essential (primary) hypertension: Secondary | ICD-10-CM | POA: Diagnosis not present

## 2020-07-27 DIAGNOSIS — I872 Venous insufficiency (chronic) (peripheral): Secondary | ICD-10-CM | POA: Diagnosis not present

## 2020-07-27 DIAGNOSIS — L97221 Non-pressure chronic ulcer of left calf limited to breakdown of skin: Secondary | ICD-10-CM | POA: Diagnosis not present

## 2020-08-01 ENCOUNTER — Other Ambulatory Visit: Payer: Self-pay

## 2020-08-01 DIAGNOSIS — I10 Essential (primary) hypertension: Secondary | ICD-10-CM | POA: Diagnosis not present

## 2020-08-01 DIAGNOSIS — L97221 Non-pressure chronic ulcer of left calf limited to breakdown of skin: Secondary | ICD-10-CM | POA: Diagnosis not present

## 2020-08-01 DIAGNOSIS — L97818 Non-pressure chronic ulcer of other part of right lower leg with other specified severity: Secondary | ICD-10-CM | POA: Diagnosis not present

## 2020-08-01 DIAGNOSIS — I890 Lymphedema, not elsewhere classified: Secondary | ICD-10-CM | POA: Diagnosis not present

## 2020-08-01 DIAGNOSIS — I872 Venous insufficiency (chronic) (peripheral): Secondary | ICD-10-CM | POA: Diagnosis not present

## 2020-08-01 DIAGNOSIS — G4733 Obstructive sleep apnea (adult) (pediatric): Secondary | ICD-10-CM | POA: Diagnosis not present

## 2020-08-01 NOTE — Progress Notes (Signed)
[name], [name] ([id number]) Visit Report for 07/27/2020 HPI Details Patient Name: [name], [name]. Date of Service: 07/27/2020 12:30 PM Medical Record Number: [id number] Patient Account Number: [id number] Date of Birth/Sex: 11/19/1945 (74 y.o. F) Treating RN: [name] Canavan Primary Care Provider: [name] Other Clinician: Referring Provider: [name] Treating Provider/Extender: Tito Dine in Treatment: 57 History of Present Illness HPI Description: Pleasant 74 year old with history of chronic venous insufficiency. No diabetes or peripheral vascular disease. Left ABI 1.29. Questionable history of left lower extremity DVT. She developed a recurrent ulceration on her left lateral calf in December 2015, which she attributes to poor diet and subsequent lower extremity edema. She underwent endovenous laser ablation of her left greater saphenous vein in 2010. She underwent laser ablation of accessory branch of left GSV in April 2016 by Dr. Kellie Simmering at [facility]. She was previously wearing Unna boots, which she tolerated well. Tolerating 2 layer compression and cadexomer iodine. She returns to clinic for follow-up and is without new complaints. She denies any significant pain at this time. She reports persistent pain with pressure. No claudication or ischemic rest pain. No fever or chills. No drainage. READMISSION 11/13/16; this is a 74 year old woman who is not a diabetic. She is here for a review of a painful area on her left medial lower extremity. I note that she was seen here previously last year for wound I believe to be in the same area. At that time she had undergone previously a left greater saphenous vein ablation by Dr. Kellie Simmering and she had a ablation of the anterior accessory branch of the left greater saphenous vein in March 2016. Seeing that the wound actually closed over. In reviewing the history with her today the ulcer in this area has been recurrent. She  describes a biopsy of this area in 2009 that only showed stasis physiology. 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 Do not see that she has ever had arterial studies in the left leg. She tells me she has a follow-up with Dr. Kellie Simmering in roughly 10 days In any case she developed the reopening of this area roughly a month ago. On the background of this she describes rapidly increasing edema which has responded to Lasix 40 mg and metolazone 2.5 mg as well as the patient's lymph massage. She has been told she has both venous insufficiency and lymphedema but she cannot tolerate compression stockings 11/28/16; the patient saw Dr. Kellie Simmering recently. Per the patient he did arterial Dopplers in the office that did not show evidence of arterial insufficiency, per the patient he stated "treat this like an ordinary venous ulcer". She also saw her dermatologist Dr. Ronnald Ramp who felt that this was more of a vascular ulcer. In general things are improving although she arrives today with increasing bilateral lower extremity edema with weeping a deeper fluid through the wound on the left medial leg compatible with some degree of lymphedema 12/04/16; the patient's wound is fully epithelialized but I don't think fully healed. We will do another week of depression with Promogran and TCA however I suspect we'll be able to discharge her next week. This is a very unusual-looking wound which was initially a figure-of-eight type wound lying on its side surrounded by petechial like hemorrhage. She  has had venous ablation on this side. She apparently does not have an arterial issue per Dr. Kellie Simmering. She saw her dermatologist thought it was "vascular". Patient is definitely going to need ongoing compression and I  talked about this with her today she will go to elastic therapy after she leaves here next week 12/11/16; the patient's wound is not completely closed today. She has surrounding scar tissue and in further discussion with the patient it would appear that she had ulcers in this area in 2009 for a prolonged period of time ultimately requiring a punch biopsy of this area that only showed venous insufficiency. I did not previously pickup on this part of the history from the patient. 12/18/16; the patient's wound is completely epithelialized. There is no open area here. She has significant bilateral venous insufficiency with secondary lymphedema to a mild-to-moderate degree she does not have compression stockings.. She did not say anything to me when I was in the room, she told our intake nurse that she was still having pain in this area. This isn't unusual recurrent small open area. She is going to go to elastic therapy to obtain compression stockings. 12/25/16; the patient's wound is fully epithelialized. There is no open area here. The patient describes some continued episodic discomfort in this area medial left calf. However everything looks fine and healed here. She is been to elastic therapy and caught herself 15-20 mmHg stockings, they apparently were having trouble getting 20-30 mm stockings in her size 01/22/17; this is a patient we discharged from the clinic a month ago. She has a recurrent open wound on her medial left calf. She had 15 mm support stockings. I told her I thought she needed 20-30 mm compression stockings. She tells me that she has been ill with hospitalization secondary to asthma and is been found to have severe hypokalemia likely secondary to a combination of Lasix and metolazone. This morning she noted blistering and leaking fluid on the posterior part of her left leg. She called our intake nurse urgently and we was saw her this afternoon. She has not had any real discomfort here. I  don't know that she's been wearing any stockings on this leg for at least 2-3 days. ABIs in this clinic were 1.21 on the right and 1.3 on the left. She is previously seen vascular surgery who does not think that there is a peripheral arterial issue. 01/30/17; Patient arrives with no open wound on the left leg. She has been to elastic therapy and obtained 20-17mmhg below knee stockings and she has one on the right leg today. READMISSION 02/19/18; this Breed is a now 74 year old patient we've had in this clinic perhaps 3 times before. I had last looked at her from January 07 December 2016 with an area on the medial left leg. We discharged her on 12/25/16 however she had to be readmitted on 01/22/17 with a recurrence. I have in my notes that we discharged her on 20-30 mm stockings although she tells me she was only wearing support hose because she cannot get stockings on predominantly related to her cervical spine surgery/issues. She has had previous ablations done by vein and vascular in [place] including a great saphenous vein ablation on the left with an anterior accessory branch ablation I think both of these were in 2016. On one of the previous visit she had a biopsy noted 2009 that was negative. She is not felt to have an arterial issue. She is not a diabetic. She does  have a history of obstructive sleep apnea hypertension asthma as well as chronic venous insufficiency and lymphedema. On this occasion she noted 2 dry scaly patch on her left leg. She tried to put lotion on this it didn't really help. There were 2 open areas.the [name], [name] ([id number]) patient has been seeing her primary physician from 02/05/18 through 02/14/18. She had Unna boots applied. The superior wound now on the lateral left leg has closed but she's had one wound that remains open on the lateral left leg. This is not the same spot as we dealt with in 2018. ABIs in this clinic were 1.3 bilaterally 02/26/18; patient has a  small wound on the left lateral calf. Dimensions are down. She has chronic venous insufficiency and lymphedema. 03/05/18; small open area on the left lateral calf. Dimensions are down. Tightly adherent necrotic debris over the surface of the wound which was difficult to remove. Also the dressing [over collagen] stuck to the wound surface. This was removed with some difficulty as well. Change the primary dressing to Hydrofera Blue ready 03/12/18; small open area on the left lateral calf. Comes in with tightly adherent surface eschar as well as some adherent Hydrofera Blue. 03/19/18; open area on the left lateral calf. Again adherent surface eschar as well as some adherent Hydrofera Blue nonviable subcutaneous tissue. She complained of pain all week even with the reduction from 4-3 layer compression I put on last week. Also she had an increase in her ankle and calf measurements probably related to the same thing. 03/26/18; open area on the left lateral calf. A very small open area remains here. We used silver alginate starting last week as the Hydrofera Blue seem to stick to the wound bed. In using 4-layer compression 04/02/18; the open area in the left lateral calf at some adherent slough which I removed there is no open area here. We are able to transition her into her own compression stocking. Truthfully I think this is probably his support hose. However this does not maintain skin integrity will be limited. She cannot put over the toe compression stockings on because of neck problems hand problems etc. She is allergic to the lining layer of juxta lites. We might be forced to use extremitease stocking should this fail READMIT 11/24/2018 Patient is now a 74 year old woman who is not a diabetic. She has been in this clinic on at least 3 previous occasions largely with recurrent wounds on her left leg secondary to chronic venous insufficiency with secondary lymphedema. Her situation is complicated by  inability to get stockings on and an allergy to neoprene which is apparently a component and at least juxta lites and other stockings. As a result she really has not been wearing any stockings on her legs. She tells [place] that roughly 2 or 3 weeks ago she started noticing a stinging sensation just above her ankle on the left medial aspect. She has been diagnosed with pseudogout and she wondered whether this was what she was experiencing. She tried to dress this with something she bought at the store however subsequently it pulled skin off and now she has an open wound that is not improving. She has been using Vaseline gauze with a cover bandage. She saw her primary doctor last week who put an Haematologist on her. ABIs in this clinic was 1.03 on the left 2/12; the area is on the left medial ankle. Odd-looking wound with what looks to be surface epithelialization but a multitude of  small petechial openings. This clearly not closed yet. We have been using silver alginate under 3 layer compression with TCA 2/19; the wound area did not look quite as good this week. Necrotic debris over the majority of the wound surface which required debridement. She continues to have a multitude of what looked to be small petechial openings. She reminds [place] that she had a biopsy on this initially during her first outbreak in 2015 in [place] dermatology. She expresses concern about this being a possible melanoma. 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 I assured her that this does not look anything like melanoma. Except for the petechial reaction it does look like a venous insufficiency area and she certainly has evidence of this on both sides 2/26; a difficult area on the left medial ankle. The patient clearly has chronic venous hypertension with some degree of lymphedema. The odd thing about the area is the small petechial  hemorrhages. I am not really sure how to explain this. This was present last time and this is not a compression injury. We have been using Hydrofera Blue which I changed to last week 3/4; still using Hydrofera Blue. Aggressive debridement today. She does not have known arterial issues. She has seen Dr. Kellie Simmering at [facility] vein and vascular and and has an ablation on the left. [Anterior accessory branch of the greater saphenous]. From what I remember they did not feel she had an arterial issue. The patient has had this area biopsied in 2009 at [facility] dermatology and by her recollection they said this was "stasis". She is also follow-up with dermatology locally who thought that this was more of a vascular issue 3/11; using Hydrofera Blue. Aggressive debridement today. She does not have an arterial issue. We are using 3 layer compression although we may need to go to 4. The patient has been in for multiple changes to her wrap since I last saw her a week ago. She says that the area was leaking. I do not have too much more information on what was found 01/19/19 on evaluation today patient was actually being seen for a nurse visit when unfortunately she had the area on her left lateral lower extremity as well as weeping from the right lower extremity that became apparent. Therefore we did end up actually seeing her for a full visit with myself. She is having some pain at this site as well but fortunately nothing too significant at this point. No fevers, chills, nausea, or vomiting noted at this time. 3/18-Patient is back to the clinic with the left leg venous leg ulcer, the ulcer is larger in size, has a surface that is densely adherent with fibrinous tissue, the Hydrofera Blue was used but is densely adherent and there was difficulty in removing it. The right lower extremity was also wrapped for weeping edema. Patient has a new area over the left lateral foot above the malleolus that is small and appears  to have no debris with intact surrounding skin. Patient is on increased dose of Lasix also as a means to edema management 3/25; the patient has a nonhealing venous ulcer on the medial left leg and last week developed a smaller area on the lateral left calf. We have been using Hydrofera Blue with a contact layer. 4/1; no major change in these wounds areas. Left medial and more recently left lateral calf. I tried Iodoflex last week to aid in debridement  she did not tolerate this. She stated her pain was terrible all week. She took the top layer of the 4 layer compression off. 4/8; the patient actually looks somewhat better in terms of her more prominent left lateral calf wound. There is some healthy looking tissue here. She is still complaining of a lot of discomfort. 4/15; patient in a lot of pain secondary to sciatica. She is on a prednisone taper prescribed by her primary physician. She has the 2 areas one on the left medial and more recently a smaller area on the left lateral calf. Both of these just above the malleoli 4/22; her back pain is better but she still states she is very uncomfortable and now feels she is intolerant to the [employer]. No real change in the wounds we have been using Sorbact. She has been previously intolerant to Iodoflex. There is not a lot of option about what we can use to debride this wound under compression that she no doubt needs. sHe states Ultram no longer works for her pain 4/29; no major change in the wounds slightly increased depth. Surface on the original medial wound perhaps somewhat improved however the more recent area on the lateral left ankle is 100% covered in very adherent debris we have been using Sorbact. She tolerates 4 layer compression well and her edema control is a lot better. She has not had to come in for a nurse check 5/6; no major change in the condition of the wounds. She did consent to debridement today which was done with some difficulty.  Continuing Sorbact. She did not tolerate Iodoflex. She was in for a check of her compression the day after we wrapped her last week this was adjusted but nothing much was found 5/13; no major change in the condition or area of the wounds. I was able to get a fairly aggressive debridement done on the lateral left leg wound. Even using Sorbact under compression. She came back in on Friday to have the wrap changed. She says she felt uncomfortable on the [name], [name]. ([id number]) lateral aspect of her ankle. She has a long history of chronic venous insufficiency including previous ablation surgery on this side. 5/20-Patient returns for wounds on left leg with both wounds covered in slough, with the lateral leg wound larger in size, she has been in 3 layer compression and felt more comfortable, she describes pain in ankle, in leg and pins and needles in foot, and is about to try Pamelor for this 6/3; wounds on the left lateral and left medial leg. The area medially which is the most recent of the 2 seems to have had the largest increase in dimensions. We have been using Sorbac to try and debride the surface. She has been to see orthopedics they apparently did a plain x-ray that was indeterminant. Diagnosed her with neuropathy and they have ordered an MRI to determine if there is underlying osteomyelitis. This was not high on my thought list but I suppose it is prudent. We have advised her to make an appointment with vein and vascular in [place]. She has a history of a left greater saphenous and accessory vein ablations I wonder if there is anything else that can be done from a surgical point of view to help in these difficult refractory wounds. We have previously healed this wound on one occasion but it keeps on reopening [medial side] 6/10; deep tissue culture I did last week I think on the left medial wound showed  both moderate E. coli and moderate staph aureus [MSSA]. She is going to require  antibiotics and I have chosen Augmentin. We have been using Sorbact and we have made better looking wound surface on both sides but certainly no improvement in wound area. She was back in last Friday apparently for a dressing changes the wrap was hurting her outer left ankle. She has not managed to get a hold of vein and vascular in [place]. We are going to have to make her that appointment 6/17; patient is tolerating the Augmentin. She had an MRI that I think was ordered by orthopedic surgeon this did not show osteomyelitis or an abscess did suggest cellulitis. We have been using Sorbact to the lateral and medial ankles. We have been trying to arrange a follow-up appointment with vein and vascular in [place] or did her original ablations. We apparently an area sent the request to vein and vascular in [facility] 6/24; patient has completed the Augmentin. We do not yet have a vein and vascular appointment in [place]. I am not sure what the issue is here we have asked her to call tomorrow. We are using Sorbact. Making some improvements and especially the medial wound. Both surfaces however look better medial and lateral. 7/1; the patient has been in contact with vein and vascular in [place] but has not yet received an appointment. Using Sorbact we have gradually improve the wound surface with no improvement in surface area. She is approved for Apligraf but the wound surface still is not completely viable. She has not had to come in for a dressing change 7/8; the patient has an appointment with vein and vascular on 7/31 which is a Friday afternoon. She is concerned about getting back here for [place] to dress her wounds. I think it is important to have them goal for her venous reflux/history of ablations etc. to see if anything else can be done. She apparently tested positive for 1 of the blood tests with regards to lupus and saw a rheumatologist. He has raised the issue of vasculitis again. I  have had this thought in the past however the evidence seems overwhelming that this is a venous reflux etiology. If the rheumatologist tells me there is clinical and laboratory investigation is positive for lupus I will rethink this. 7/15; the patient's wound surfaces are quite a bit better. The medial area which was her original wound now has no depth although the lateral wound which was the more recent area actually appears larger. Both with viable surfaces which is indeed better. Using Sorbact. I wanted to use Apligraf on her however there is the issue of the vein and vascular appointment on 7/31 at 2:00 in the afternoon which would not allow her to get back to be rewrapped and they would no doubt remove the graft 7/22; the patient's wound surfaces have moderate amount of debris although generally look better. The lateral one is larger with 2 small satellite areas superiorly. We are waiting for her vein and vascular appointment on 7/31. She has been approved for Apligraf which I would like to use after th 7/29; wound surfaces have improved no debridement is required we have been using Sorbact. She sees vein and vascular on Friday with this so question of whether anything can be done to lessen the likelihood of recurrence and/or speed the healing of these areas. She is already had previous ablations. She no doubt has severe venous hypertension 8/5-Patient returns at 1 week, she was in [employer]  for 3 days by her podiatrist, we have been using so backed to the wound, she has increased pain in both the wounds on the left lower leg especially the more distal one on the lateral aspect 8/12-Patient returns at 1 week and she is agreeable to having debridement in both wounds on her left leg today. We have been using Sorbact, and vascular studies were reviewed at last visit 8/19; the patient arrives with her wounds fairly clean and no debridement is required. We have used Sorbact which is really done a  nice job in cleaning up these very difficult wound surfaces. The patient saw Dr. Donzetta Matters of vascular surgery on 7/31. He did not feel that there was an arterial component. He felt that her treated greater saphenous vein is adequately addressed and that the small saphenous vein did not appear to be involved significantly. She was also noted to have deep venous reflux which is not treatable. Dr. Donzetta Matters mentioned the possibility of a central obstructive component leading to reflux and he offered her central venography. She wanted to discuss this or think about it. I have urged her to go ahead with this. She has had recurrent difficult wounds in these areas which do heal but after months in the clinic. If there is anything that can be done to reduce the likelihood of this I think it is worth it. 9/2 she is still working towards getting follow-up with Dr. Donzetta Matters to schedule her CT. Things are quite a bit worse venography. I put Apligraf on 2 weeks ago on both wounds on the medial and lateral part of her left lower leg. She arrives in clinic today with 3 superficial additional wounds above the area laterally and one below the wound medially. She describes a lot of discomfort. I think these are probably wrapped injuries. Does not look like she has cellulitis. 07/20/2019 on evaluation today patient appears to be doing somewhat poorly in regard to her lower extremity ulcers. She in fact showed signs of erythema in fact we may even be dealing with an infection at this time. Unfortunately I am unsure if this is just infection or if indeed there may be some allergic reaction that occurred as a result of the Apligraf application. With that being said that would be unusual but nonetheless not impossible in this patient is one who is unfortunately allergic to quite a bit. Currently we have been using the Sorbact which seems to do as well as anything for her. I do think we may want to obtain a culture today to see if there  is anything showing up there that may need to be addressed. 9/16; noted that last week the wounds look worse in 1 week follow-up of the Apligraf. Using Sorbact as of 2 days ago. She arrives with copious amounts of drainage and new skin breakdown on the back of the left calf. The wounds arm more substantial bilaterally. There is a fair amount of swelling in the left calf no overt DVT there is edema present I think in the left greater than right thigh. She is supposed to go on 9/28 for CT venography. The wounds on the medial and lateral calf are worse and she has new skin breakdown posteriorly at least new for me. This is almost developing into a circumferential wound area The Apligraf was taken off last week which I agree with things are not going in the right direction a culture was done we do not have that back yet. She is  on Augmentin that she started 2 days ago 9/23; dressing was changed by her nurses on Monday. In general there is no improvement in the wound areas although the area looks less angry than last week. She did get Augmentin for MSSA cultured on the 14th. She still appears to have too much swelling in the left leg even with 3 layer compression 9/30; the patient underwent her procedure on 9/28 by Dr. Donzetta Matters at [facility]. She was discovered to have the common iliac vein measuring 12.2 mm but at the level of L4-L5 measured 3 mm. After stenting it measured 10 mm. 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 Rouleaux flow in the common femoral and femoral vein was observed much improved after stenting. We are using silver alginate to the wounds on the medial and lateral ankle on the left. 4 layer compression 10/7; the patient had fluid swelling around her knee and 4 layer compression. At the advice of vein and vascular this was reduced to 3 layer which she is tolerating better. We have been using silver alginate under 3 layer compression since last  Friday 10/14; arrives with the areas on the left ankle looking a lot better. Inflammation in the area also a lot better. She came in for a nurse check on [name], [name] ([id number]) 10/9 10/21; continued nice improvement. Slight improvements in surface area of both the medial and lateral wounds on the left. A lot of the satellite lesions in the weeping erythema around these from stasis dermatitis is resolved. We have been using silver alginate 10/28; general improvement in the entire wound areas although not a lot of change in dimensions the wound certainly looks better. There is a lot less in terms of venous inflammation. Continue silver alginate this week however look towards Hydrofera Blue next week 11/4; very adherent debris on the medial wound left wound is not as bad. We have been using silver alginate. Change to [facility] today 11/11; very adherent debris on both wound areas. She went to vein and vascular last week and follow-up they put in [place] boot on this today. He says the [facility] was adherent. Wound is definitely not as good as last week. Especially on the left there the satellite lesions look more prominent 11/18; absolutely no better. erythema on lateral aspect with tenderness. 09/30/2019 on evaluation today patient appears to actually be doing better. Dr. Dellia Nims did put her on doxycycline last week which I do believe has helped her at this point. Fortunately there is no signs of active infection at this time. No fevers, chills, nausea, vomiting, or diarrhea. I do believe he may want extend the doxycycline for 7 additional days just to ensure everything does completely cleared up the patient is in agreement with that plan. Otherwise she is going require some sharp debridement today 12/2; patient is completing a 2-week course of doxycycline. I gave her this empirically for inflammation as well as infection when I last saw her 2 weeks ago. All of this seems to be better.  She is using silver alginate she has the area on the medial aspect of the larger area laterally and the 2 small satellite regions laterally above the major wound. 12/9; the patient's wound on the left medial and left lateral calf look really quite good. We have been using silver alginate. She saw vein and vascular in follow-up on 10/09/2019. She has had a previous left greater saphenous vein ablation by Dr. Oscar La in 2016.  More recently she underwent a left common iliac vein stent by Dr. 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 The swelling is improved and certainly the wounds have improved. The patient shows [place] today area on the right medial calf there is almost no wound but leaking lymphedema. She says she start this started 3 or 4 days ago. She did not traumatize it. It is not painful. She does not wear compression on that side 12/16; the patient continues to do well laterally. Medially still requiring debridement. The area on the right calf did not materialize to anything and is not currently open. We wrapped this last time. She has support stockings for that leg although I am not sure they are going to provide adequate compression 12/23; the lateral wound looks stable. Medially still requiring debridement for tightly adherent fibrinous debris. We've been using silver alginate. Surface area not any different 12/30; neither wound is any better with regards to surface and the area on the left lateral is larger. I been using silver alginate to the left lateral which look quite good last week and Sorbact to the left medial 11/11/2019. Lateral wound area actually looks better and somewhat smaller. Medial still requires a very aggressive debridement today. We have been using Sorbact on both wound areas 1/13; not much better still adherent debris bilaterally. I been using Sorbact. She has severe venous hypertension. Probably some degree of dermal fibrosis distally. I wonder whether tighter  compression might help and I am going to try that today. We also need to work on the bioburden 1/20; using Sorbact. She has severe venous hypertension status post stent placement for pelvic vein compression. We applied gentamicin last time to see if we could reduce bioburden I had some discussion with her today about the use of pentoxifylline. This is occasionally used in this setting for wounds with refractory venous insufficiency. However this interacts with Plavix. She tells me that she was put on this after stent placement for 3 months. She will call Dr. Claretha Cooper office to discuss 1/27; we are using gentamicin under Sorbact. 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 She has a stent. Wound medially is measuring smaller this week. Laterally measuring slightly larger although she has some satellite lesions superiorly 2/3; gentamicin under Sorbact under 4-layer compression. 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 She has a stent on Plavix. Her wounds are measuring smaller this week. More substantially laterally where there is a satellite lesion superiorly. 2/10; gentamicin under Sorbac. 4-layer compression. Patient communicated with Dr. Donzetta Matters at vein and vascular in [place]. He is okay with the patient coming off Plavix I will therefore start her on pentoxifylline for a 1 month trial. In general her wounds look better today. I had some concerns about swelling in the left thigh however she measures 61.5 on the right and 63 on the mid thigh which does not suggest there is any difficulty. The patient is not describing any pain. 2/17; gentamicin under Sorbac 4-layer compression. She has been on pentoxifylline for 1 week and complains of loose stool. No nausea she is eating and drinking well 2/24; the patient apparently came in 2 days ago for a nurse visit when her wrap fell down. Both areas look a little worse this week macerated medially and  satellite lesions laterally. Change to silver alginate today 3/3; wounds are larger today especially medially. She also has more swelling in her foot lower leg and I even noted some swelling in  her posterior thigh which is tender. I wonder about the patency of her stent. Fortuitously she sees Dr. Claretha Cooper group on Friday 3/10; Mrs. Wenz was seen by vein and vascular on 3/5. The patient underwent ultrasound. There was no evidence of thrombosis involving the IVC no evidence of thrombosis involving the right common iliac vein there is no evidence of thrombosis involving the right external iliac vein the left external vein is also patent. The right common iliac vein stent appears patent bilateral common femoral veins are compressible and appear patent. I was concerned about the left common iliac stent however it looks like this is functional. She has some edema in the posterior thigh that was tender she still has that this week. I also note they had trouble finding the pulses in her left foot and booked her for an ABI baseline in 4 weeks. She will follow up in 6 months for repeat IVC duplex. The patient stopped the pentoxifylline because of diarrhea. It does not look like that was being effective in any case. I have advised her to go back on her aspirin 81 mg tablet, vascular it also suggested this 3/17; comes in today with her wound surfaces a lot better. The excoriations from last week considerably better probably secondary to the TCA. We have been using silver alginate 3/24; comes in today with smaller wounds both medially and laterally. Both required debridement. There are 2 small satellite areas superiorly laterally. She also has a very odd bandlike area in the mid calf almost looking like there was a weakness in the wrap in a localized area. I would write this off as being this however anteriorly she has a small raised ballotable area that is very tender almost reminiscent of an abscess but there  was no obvious purulent surface to it. 02/04/20 upon evaluation today patient appears to be doing fairly well in regard to her wounds today. Fortunately there is no signs of active [name], [name]. ([id number]) infection at this time. No fevers, chills, nausea, vomiting, or diarrhea. She has been tolerating the dressing changes without complication. Fortunately I feel like she is showing signs of improvement although has been sometime since have seen her. Nonetheless the area of concern that Dr. Dellia Nims had last week where she had possibly an area of the wrap that was we can allow the leg to bulge appears to be doing significantly better today there is no signs of anything worsening. 4/7; the patient's wounds on her medial and lateral left leg continue to contract. We have been using a regular alginate. Last week she developed an area on the right medial lower leg which is probably a venous ulcer as well. 4/14; the wounds on her left medial and lateral lower leg continue to contract. Surface eschar. We have been using regular alginate. The area on the right medial lower leg is closed. We have been putting both legs under 4-layer contraction. The patient went back to see vein and vascular she had arterial studies done which were apparently "quite good" per the patient although I have not read their notes I have never felt she had an arterial issue. 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 This is been longstanding and refractory to exercise, leg elevation and longstanding use of compression wraps in our clinic as well as compression stockings on the times we have been able to get these to heal 4/21; we thought she actually might be close this week however she arrives in  clinic with a lot of edema in her upper left calf and into her posterior thigh. This is been an intermittent problem here. She says the wrap fell down but it was replaced with a nurse visit  on Monday. We are using calcium alginate to the wounds and the wound sizes there not terribly larger than last week but there is a lot more edema 4/28; again wound edges are smaller on both sides. Her edema is better controlled than last time. She is obtained her compression pumps from [employer] although they have not been to her home to set these up. 5/5; left medial and left lateral both look stable. I am not sure the medial is any smaller. We have been using calcium alginate under 4-layer compression. oShe had an area on the right medial. This was eschared today. We have been wrapping this as well. She does not tolerate external compression stockings due to a history of various contact allergies. She has her compression pumps however the representative from the company is coming on her to show her how to use these tomorrow 5/19; patient with severe chronic venous insufficiency secondary to central venous disease. She had a stent placed in her left common iliac vein. She has done better since but still difficult to control wounds. She comes in today with nothing open on the right leg. Her areas on the left medial and left lateral are just about closed. We are using calcium alginate under 4-layer compression. She is using her external compression pumps at home She only has 15-20 support stockings. States she cannot get anything tighter than that on. 03/30/20-Patient returns at 1 week, the wounds on the left leg are both slightly bigger, the last week she was on 3 layer compression which started to slide down. She is starting to use her lymphedema pumps although she stated on 1 day her right ankle started to swell up and she have to stop that day. Unfortunately the open area seem to oscillate between improving to the point of healing and then flaring up all to do with effectiveness of compression or lack of due to the left leg topography not keeping the compression wraps from rolling  down 6/2 patient comes in with a 15/20 mmHg stocking on the right leg. She tells me that she developed a lot of swelling in her ankles she saw orthopedics she was felt to possibly be having a flare of pseudogout versus some other type of arthritis. She was put on steroids for a respiratory issue so that helps with the inflammation. She has not been using the pumps all week. She thinks the left thigh is more swollen than usual and I would agree with that. She has an appointment with Dr. Donzetta Matters 9 days or so from now 6/9; both wounds on the left medial and left lateral are smaller. We have been using calcium alginate under compression. She does not have an open wound on the right leg she is using a stocking and her compression pumps things are going well. She has an appointment with Dr. Donzetta Matters with regards to her stent in the left common iliac vein 6/16; the wounds on the left medial and left lateral ankle continues to contract. The patient saw Dr. Donzetta Matters and I think he seems satisfied. Ordered follow-up venous reflux studies on both sides in September. Cautioned that she may need thigh-high stockings. She has been using calcium alginate under compression on the left and her own stocking on the right leg.  She tells [place] there are no open wounds on the right 6/23; left lateral is just about closed. Medial required debridement today. We have been using calcium alginate. Extensive discussion about the compression pumps she is only using these on 25 mmHg states she could not take 40 or 30 when the wrap came out to her home to demonstrate these. He said they should not feel tight 6/30; the left lateral wound has a slight amount of eschar. . The area medially is about the same using Hydrofera Blue. 7/7; left lateral wound still has some eschar. I will remove this next week may be closed. The area medially is very small using Hydrofera Blue with improvement. Unfortunately the stockings fell down. Unfortunately the  blisters have developed at the edge of where the wrap fell. When this happened she says her legs hurt she did not use her pumps. We are not open Monday for her to come in and change the wraps and she had an appointment yesterday. She also tells me that she is going to have an MRI of her back. She is having pain radiating into her left anterior leg she thinks her from an L5 disc. She saw Dr. Ellene Route of neurosurgery 7/14; the area on the left lateral ankle area is closed. Still a small area medially however it looks better as well. We have been using Hydrofera Blue under 4-layer compression 7/21; left lateral ankle is still closed however her wound on the medial left calf is actually larger. This is probably because Hydrofera Blue got stuck to the wound. She came in for a nurse change on Friday and will do that again this week I was concerned about the amount of swelling that she had last week however she is using her compression pumps twice a day and the swelling seems well controlled 7/28; remaining wound on the left medial lower leg is smaller. We have been using moistened silver collagen under compression she is coming back for a nurse visit. For reasons that were not really clear she was just keeping her legs elevated and not using her compression pumps. I have asked her to use the compression pumps. She does not have any wounds on the right leg 06/15/20-Patient returns at 2 weeks, her LLE edema is worse and she developed a blister wound that is new and has bigger posterior calf wound on right, we are using Prisma with pad, 4 layer compression. she has been on lasix 40 mg daily 8/18; patient arrives today with things a lot worse than I remember from a few weeks ago. She was seen last week. Noted that her edema was worse and that she now had a left lateral wound as well as deteriorating edema in the medial and posterior part of the lower leg. She says she is using his or her external compression  pumps once a day although I wonder about the compliance. 8/25; weeping area on the right medial lower leg. This had actually gotten a small localized area of her compression stocking wet. oOn the left side there is a large denuded area on the posterior medial lower leg and smaller area on the lateral. This was not the original areas that we dealt with. 9/1 the patient's wound on the left leg include the left lateral and left posterior. Larger superficial wounds weeping. She has very poor edema control. Tender localized edema in the left lower medial ankle/heel probably because of localized wrap issues. She freely admits she is not using the  compression pumps. She has been up on her feet a lot. She thinks the hydrofera blue is contributing to the pain she is experiencing.. This is a complaint that I have occasionally heard 9/8; really not much improvement. The patient is still complaining of a lot of pain particularly when she uses compression pumps. I switched her [name], [name] ([id number]) to silver alginate last time because she found the Hydrofera Blue to be irritating. I don't hear much difference in her description with the silver alginate. 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 She has follow-up with Dr. Donzetta Matters I think for ultrasound next month 9/15; quite a bit of improvement today. We have less edema and more epithelialization in both of her wound areas on the left medial and left lateral calf. These are not the site of her original wounds in this area. She says she has been using her compression pumps for 30 minutes twice a day, there pain issues that never quite understood. Silver alginate as the primary dressing 9/22; continued improvement. Both areas medially and laterally still have a small open area there is some eschar. She continues to complain of left medial ankle pain. Swelling in the leg is in much  better condition. We have been using silver alginate Electronic Signature(s) Signed: 08/01/2020 9:55:55 AM By: Linton Ham MD Entered By: Linton Ham on 07/27/2020 13:27:54 Aikens, [name] ([id number]) -------------------------------------------------------------------------------- Physical Exam Details Patient Name: [name], [name]. Date of Service: 07/27/2020 12:30 PM Medical Record Number: [id number] Patient Account Number: [id number] Date of Birth/Sex: 1946/07/16 (74 y.o. F) Treating RN: [name] Canavan Primary Care Provider: [name] Other Clinician: Referring Provider: [name] Treating Provider/Extender: Tito Dine in Treatment: 1 Constitutional Patient is hypertensive.. Pulse regular and within target range for patient.Marland Kitchen Respirations regular, non-labored and within target range.. Temperature is normal and within the target range for the patient.Marland Kitchen appears in no distress. Cardiovascular Pedal pulses are palpable. Edema present in both extremities. However much better including the thigh. Still an area of localized edema just below the knee in a band with distribution. Notes Wound exam; continued improvement in the wounded area on the medial there is still some eschared areas but they seem to be mostly covering normal epithelialization and lateral left calf distally. Electronic Signature(s) Signed: 08/01/2020 9:55:55 AM By: Linton Ham MD Entered By: Linton Ham on 07/27/2020 13:29:48 [name] ([id number]) -------------------------------------------------------------------------------- Physician Orders Details Patient Name: [name], [name]. Date of Service: 07/27/2020 12:30 PM Medical Record Number: [id number] Patient Account Number: [id number] Date of Birth/Sex: 1946-10-31 (74 y.o. F) Treating RN: [name] Canavan Primary Care Provider: [name] Other Clinician: Referring Provider: [name] Treating  Provider/Extender: Tito Dine in Treatment: 34 Verbal / Phone Orders: No Diagnosis Coding Wound Cleansing Wound #12 Left,Lateral Lower Leg o Dial antibacterial soap, wash wounds, rinse and pat dry prior to dressing wounds Wound #5 Left,Medial Lower Leg o Dial antibacterial soap, wash wounds, rinse and pat dry prior to dressing wounds Anesthetic (add to Medication List) Wound #12 Left,Lateral Lower Leg o Topical Lidocaine 4% cream applied to wound bed prior to debridement (In Clinic Only). - if needed. Wound #5 Left,Medial Lower Leg o Topical Lidocaine 4% cream applied to wound bed prior to debridement (In Clinic Only). - if needed. Skin Barriers/Peri-Wound Care Wound #12 Left,Lateral Lower Leg o Triamcinolone Acetonide Ointment (TCA) Wound #5 Left,Medial Lower Leg o Triamcinolone Acetonide  Ointment (TCA) Primary Wound Dressing Wound #12 Left,Lateral Lower Leg o Silver Alginate Wound #5 Left,Medial Lower Leg o Silver Alginate Secondary Dressing Wound #12 Left,Lateral Lower Leg o ABD pad Wound #5 Left,Medial Lower Leg o ABD pad Dressing Change Frequency Wound #12 Left,Lateral Lower Leg o Change dressing every week o Other: - Nurse visit Friday and Monday if needed. Wound #5 Left,Medial Lower Leg o Change dressing every week o Other: - Nurse visit Friday and Monday if needed. Follow-up Appointments Wound #12 Left,Lateral Lower Leg o Return Appointment in 1 week. o Nurse Visit as needed Wound #5 Left,Medial Lower Leg o Return Appointment in 1 week. o Nurse Visit as needed Edema Control KAYDYNCE, PAT ([id number]) Wound #12 Left,Lateral Lower Leg o 4-Layer Compression System - Left Lower Extremity. o Patient to wear own compression stockings - Right leg o Compression Pump: Use compression pump on left lower extremity for 60 minutes, twice daily. - at least 45 minutes o Compression Pump: Use compression pump on right  lower extremity for 60 minutes, twice daily. - at least 45 minutes Wound #5 Left,Medial Lower Leg o 4-Layer Compression System - Left Lower Extremity. o Patient to wear own compression stockings - Right leg o Compression Pump: Use compression pump on left lower extremity for 60 minutes, twice daily. - at least 45 minutes o Compression Pump: Use compression pump on right lower extremity for 60 minutes, twice daily. - at least 45 minutes Electronic Signature(s) Signed: 07/27/2020 4:32:30 PM By: [name] Canavan Signed: 08/01/2020 9:55:55 AM By: Linton Ham MD Entered By: [name] Canavan on 07/27/2020 13:08:27 EDIN, [name] ([id number]) -------------------------------------------------------------------------------- Problem List Details Patient Name: MYAN, LOCATELLI. Date of Service: 07/27/2020 12:30 PM Medical Record Number: [id number] Patient Account Number: [id number] Date of Birth/Sex: 1946/06/17 (74 y.o. F) Treating RN: [name] Canavan Primary Care Provider: [name] Other Clinician: Referring Provider: [name] Treating Provider/Extender: Tito Dine in Treatment: 91 Active Problems ICD-10 Encounter Code Description Active Date MDM Diagnosis L97.221 Non-pressure chronic ulcer of left calf limited to breakdown of skin 01/07/2019 No Yes I89.0 Lymphedema, not elsewhere classified 12/10/2018 No Yes I87.332 Chronic venous hypertension (idiopathic) with ulcer and inflammation of 12/09/2019 No Yes left lower extremity I83.009 Varicose veins of unspecified lower extremity with ulcer of unspecified 04/13/2020 No Yes site L97.818 Non-pressure chronic ulcer of other part of right lower leg with other 10/14/2019 No Yes specified severity I82.592 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 Date of Service: 07/27/2020 12:30 PM Medical Record Number: [id number] Patient Account Number: [id number] Date of Birth/Sex: 03-29-1946 (74 y.o. F) Treating RN: [name] Canavan Primary Care Provider: [name] Other Clinician: Referring Provider: [name] Treating Provider/Extender: Tito Dine in Treatment: 16 Subjective History of Present Illness (HPI) Pleasant 74 year old with history of chronic venous insufficiency. No diabetes or peripheral vascular disease. Left ABI 1.29. Questionable history of left lower extremity DVT. She developed a recurrent ulceration on her left lateral calf in December 2015, which she attributes to poor diet and subsequent lower extremity edema. She underwent endovenous laser ablation of her left greater saphenous vein in 2010. She underwent  laser ablation of accessory branch of left GSV in April 2016 by Dr. Kellie Simmering at [facility]. She was previously wearing Unna boots, which she tolerated well. Tolerating 2 layer compression and cadexomer iodine. She returns to clinic for follow-up and is without new complaints. She denies any significant pain at this time. She reports persistent pain with pressure. No claudication or ischemic rest pain. No fever or chills. No drainage. READMISSION 11/13/16; this is a 74 year old woman who is not a diabetic. She is here for a review of a painful area on her left medial lower extremity. I note that she was seen here previously last year for wound I believe to be in the same area. At that time she had undergone previously a left  greater saphenous vein ablation by Dr. Kellie Simmering and she had a ablation of the anterior accessory branch of the left greater saphenous vein in March 2016. Seeing that the wound actually closed over. In reviewing the history with her today the ulcer in this area has been recurrent. She describes a biopsy of this area in 2009 that only showed stasis physiology. 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 Do not see that she has ever had arterial studies in the left leg. She tells me she has a follow-up with Dr. Kellie Simmering in roughly 10 days In any case she developed the reopening of this area roughly a month ago. On the background of this she describes rapidly increasing edema which has responded to Lasix 40 mg and metolazone 2.5 mg as well as the patient's lymph massage. She has been told she has both venous insufficiency and lymphedema but she cannot tolerate compression stockings 11/28/16; the patient saw Dr. Kellie Simmering recently. Per the patient he did arterial Dopplers in the office that did not show evidence of arterial insufficiency, per the patient he stated "treat this like an ordinary venous ulcer". She also saw her dermatologist Dr. Ronnald Ramp who felt that this was more of a vascular ulcer. In general things are improving although she arrives today with increasing bilateral lower extremity edema with weeping a deeper fluid through the wound on the left medial leg compatible with some degree of lymphedema 12/04/16; the patient's wound is fully epithelialized but I don't think fully healed. We will do another week of depression with Promogran and TCA however I suspect we'll be able to discharge her next week. This is a very unusual-looking wound which was initially a figure-of-eight type  wound lying on its side surrounded by petechial like hemorrhage. She has had venous ablation on this side. She apparently does not have an arterial issue per Dr. Kellie Simmering. She saw her dermatologist thought it was "vascular". Patient is definitely going to need ongoing compression and I talked about this with her today she will go to elastic therapy after she leaves here next week 12/11/16; the patient's wound is not completely closed today. She has surrounding scar tissue and in further discussion with the patient it would appear that she had ulcers in this area in 2009 for a prolonged period of time ultimately requiring a punch biopsy of this area that only showed venous insufficiency. I did not previously pickup on this part of the history from the patient. 12/18/16; the patient's wound is completely epithelialized. There  is no open area here. She has significant bilateral venous insufficiency with secondary lymphedema to a mild-to-moderate degree she does not have compression stockings.. She did not say anything to me when I was in the room, she told our intake nurse that she was still having pain in this area. This isn't unusual recurrent small open area. She is going to go to elastic therapy to obtain compression stockings. 12/25/16; the patient's wound is fully epithelialized. There is no open area here. The patient describes some continued episodic discomfort in this area medial left calf. However everything looks fine and healed here. She is been to elastic therapy and caught herself 15-20 mmHg stockings, they apparently were having trouble getting 20-30 mm stockings in her size 01/22/17; this is a patient we discharged from the clinic a month ago. She has a recurrent open wound on her medial left calf. She had 15 mm support stockings. I told her I thought she needed 20-30 mm compression stockings. She tells me that she has been ill with hospitalization secondary to asthma and is been found to have  severe hypokalemia likely secondary to a combination of Lasix and metolazone. This morning she noted blistering and leaking fluid on the posterior part of her left leg. She called our intake nurse urgently and we was saw her this afternoon. She has not had any real discomfort here. I don't know that she's been wearing any stockings on this leg for at least 2-3 days. ABIs in this clinic were 1.21 on the right and 1.3 on the left. She is previously seen vascular surgery who does not think that there is a peripheral arterial issue. 01/30/17; Patient arrives with no open wound on the left leg. She has been to elastic therapy and obtained 20-59mmhg below knee stockings and she has one on the right leg today. READMISSION 02/19/18; this Gane is a now 74 year old patient we've had in this clinic perhaps 3 times before. I had last looked at her from January 07 December 2016 with an area on the medial left leg. We discharged her on 12/25/16 however she had to be readmitted on 01/22/17 with a recurrence. I have in my notes that we discharged her on 20-30 mm stockings although she tells me she was only wearing support hose because she cannot get stockings on predominantly related to her cervical spine surgery/issues. She has had previous ablations done by vein and vascular in [place] including a great saphenous vein ablation on the left with an anterior accessory branch ablation I think both of these were in 2016. On one of the previous visit she had a biopsy noted 2009 that was negative. She is not felt to have an arterial issue. She is not a diabetic. She does have a history of obstructive sleep apnea hypertension asthma as well as chronic venous insufficiency and lymphedema. On this occasion she noted 2 dry scaly patch on her left leg. She tried to put lotion on this it didn't really help. There were 2 open areas.the patient has been seeing her primary physician from 02/05/18 through 02/14/18. She had Unna boots  applied. The superior wound now on the lateral left leg has closed but she's had one wound that remains open on the lateral left leg. This is not the same spot as we dealt with in 2018. ABIs in this clinic were 1.3 bilaterally MECHEL, HAGGARD ([id number]) 02/26/18; patient has a small wound on the left lateral calf. Dimensions are down. She has chronic venous insufficiency  and lymphedema. 03/05/18; small open area on the left lateral calf. Dimensions are down. Tightly adherent necrotic debris over the surface of the wound which was difficult to remove. Also the dressing [over collagen] stuck to the wound surface. This was removed with some difficulty as well. Change the primary dressing to Hydrofera Blue ready 03/12/18; small open area on the left lateral calf. Comes in with tightly adherent surface eschar as well as some adherent Hydrofera Blue. 03/19/18; open area on the left lateral calf. Again adherent surface eschar as well as some adherent Hydrofera Blue nonviable subcutaneous tissue. She complained of pain all week even with the reduction from 4-3 layer compression I put on last week. Also she had an increase in her ankle and calf measurements probably related to the same thing. 03/26/18; open area on the left lateral calf. A very small open area remains here. We used silver alginate starting last week as the Hydrofera Blue seem to stick to the wound bed. In using 4-layer compression 04/02/18; the open area in the left lateral calf at some adherent slough which I removed there is no open area here. We are able to transition her into her own compression stocking. Truthfully I think this is probably his support hose. However this does not maintain skin integrity will be limited. She cannot put over the toe compression stockings on because of neck problems hand problems etc. She is allergic to the lining layer of juxta lites. We might be forced to use extremitease stocking should this  fail READMIT 11/24/2018 Patient is now a 74 year old woman who is not a diabetic. She has been in this clinic on at least 3 previous occasions largely with recurrent wounds on her left leg secondary to chronic venous insufficiency with secondary lymphedema. Her situation is complicated by inability to get stockings on and an allergy to neoprene which is apparently a component and at least juxta lites and other stockings. As a result she really has not been wearing any stockings on her legs. She tells [place] that roughly 2 or 3 weeks ago she started noticing a stinging sensation just above her ankle on the left medial aspect. She has been diagnosed with pseudogout and she wondered whether this was what she was experiencing. She tried to dress this with something she bought at the store however subsequently it pulled skin off and now she has an open wound that is not improving. She has been using Vaseline gauze with a cover bandage. She saw her primary doctor last week who put an Haematologist on her. ABIs in this clinic was 1.03 on the left 2/12; the area is on the left medial ankle. Odd-looking wound with what looks to be surface epithelialization but a multitude of small petechial openings. This clearly not closed yet. We have been using silver alginate under 3 layer compression with TCA 2/19; the wound area did not look quite as good this week. Necrotic debris over the majority of the wound surface which required debridement. She continues to have a multitude of what looked to be small petechial openings. She reminds [place] that she had a biopsy on this initially during her first outbreak in 2015 in [place] dermatology. She expresses concern about this being a possible melanoma. 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 I assured her that this does not look anything like melanoma. Except for the petechial reaction it  does look like  a venous insufficiency area and she certainly has evidence of this on both sides 2/26; a difficult area on the left medial ankle. The patient clearly has chronic venous hypertension with some degree of lymphedema. The odd thing about the area is the small petechial hemorrhages. I am not really sure how to explain this. This was present last time and this is not a compression injury. We have been using Hydrofera Blue which I changed to last week 3/4; still using Hydrofera Blue. Aggressive debridement today. She does not have known arterial issues. She has seen Dr. Kellie Simmering at [facility] vein and vascular and and has an ablation on the left. [Anterior accessory branch of the greater saphenous]. From what I remember they did not feel she had an arterial issue. The patient has had this area biopsied in 2009 at [facility] dermatology and by her recollection they said this was "stasis". She is also follow-up with dermatology locally who thought that this was more of a vascular issue 3/11; using Hydrofera Blue. Aggressive debridement today. She does not have an arterial issue. We are using 3 layer compression although we may need to go to 4. The patient has been in for multiple changes to her wrap since I last saw her a week ago. She says that the area was leaking. I do not have too much more information on what was found 01/19/19 on evaluation today patient was actually being seen for a nurse visit when unfortunately she had the area on her left lateral lower extremity as well as weeping from the right lower extremity that became apparent. Therefore we did end up actually seeing her for a full visit with myself. She is having some pain at this site as well but fortunately nothing too significant at this point. No fevers, chills, nausea, or vomiting noted at this time. 3/18-Patient is back to the clinic with the left leg venous leg ulcer, the ulcer is larger in size, has a surface that is densely  adherent with fibrinous tissue, the Hydrofera Blue was used but is densely adherent and there was difficulty in removing it. The right lower extremity was also wrapped for weeping edema. Patient has a new area over the left lateral foot above the malleolus that is small and appears to have no debris with intact surrounding skin. Patient is on increased dose of Lasix also as a means to edema management 3/25; the patient has a nonhealing venous ulcer on the medial left leg and last week developed a smaller area on the lateral left calf. We have been using Hydrofera Blue with a contact layer. 4/1; no major change in these wounds areas. Left medial and more recently left lateral calf. I tried Iodoflex last week to aid in debridement she did not tolerate this. She stated her pain was terrible all week. She took the top layer of the 4 layer compression off. 4/8; the patient actually looks somewhat better in terms of her more prominent left lateral calf wound. There is some healthy looking tissue here. She is still complaining of a lot of discomfort. 4/15; patient in a lot of pain secondary to sciatica. She is on a prednisone taper prescribed by her primary physician. She has the 2 areas one on the left medial and more recently a smaller area on the left lateral calf. Both of these just above the malleoli 4/22; her back pain is better but she still states she is very uncomfortable and now feels she is intolerant to the [employer]  boot. No real change in the wounds we have been using Sorbact. She has been previously intolerant to Iodoflex. There is not a lot of option about what we can use to debride this wound under compression that she no doubt needs. sHe states Ultram no longer works for her pain 4/29; no major change in the wounds slightly increased depth. Surface on the original medial wound perhaps somewhat improved however the more recent area on the lateral left ankle is 100% covered in very adherent  debris we have been using Sorbact. She tolerates 4 layer compression well and her edema control is a lot better. She has not had to come in for a nurse check 5/6; no major change in the condition of the wounds. She did consent to debridement today which was done with some difficulty. Continuing Sorbact. She did not tolerate Iodoflex. She was in for a check of her compression the day after we wrapped her last week this was adjusted but nothing much was found 5/13; no major change in the condition or area of the wounds. I was able to get a fairly aggressive debridement done on the lateral left leg wound. Even using Sorbact under compression. She came back in on Friday to have the wrap changed. She says she felt uncomfortable on the lateral aspect of her ankle. She has a long history of chronic venous insufficiency including previous ablation surgery on this side. 5/20-Patient returns for wounds on left leg with both wounds covered in slough, with the lateral leg wound larger in size, she has been in 3 layer compression and felt more comfortable, she describes pain in ankle, in leg and pins and needles in foot, and is about to try Pamelor for this 6/3; wounds on the left lateral and left medial leg. The area medially which is the most recent of the 2 seems to have had the largest increase in Mylo, COPELYN WIDMER. ([id number]) dimensions. We have been using Sorbac to try and debride the surface. She has been to see orthopedics they apparently did a plain x-ray that was indeterminant. Diagnosed her with neuropathy and they have ordered an MRI to determine if there is underlying osteomyelitis. This was not high on my thought list but I suppose it is prudent. We have advised her to make an appointment with vein and vascular in [place]. She has a history of a left greater saphenous and accessory vein ablations I wonder if there is anything else that can be done from a surgical point of view to help in these  difficult refractory wounds. We have previously healed this wound on one occasion but it keeps on reopening [medial side] 6/10; deep tissue culture I did last week I think on the left medial wound showed both moderate E. coli and moderate staph aureus [MSSA]. She is going to require antibiotics and I have chosen Augmentin. We have been using Sorbact and we have made better looking wound surface on both sides but certainly no improvement in wound area. She was back in last Friday apparently for a dressing changes the wrap was hurting her outer left ankle. She has not managed to get a hold of vein and vascular in [place]. We are going to have to make her that appointment 6/17; patient is tolerating the Augmentin. She had an MRI that I think was ordered by orthopedic surgeon this did not show osteomyelitis or an abscess did suggest cellulitis. We have been using Sorbact to the lateral and medial ankles. We have  been trying to arrange a follow-up appointment with vein and vascular in [place] or did her original ablations. We apparently an area sent the request to vein and vascular in [facility] 6/24; patient has completed the Augmentin. We do not yet have a vein and vascular appointment in [place]. I am not sure what the issue is here we have asked her to call tomorrow. We are using Sorbact. Making some improvements and especially the medial wound. Both surfaces however look better medial and lateral. 7/1; the patient has been in contact with vein and vascular in [place] but has not yet received an appointment. Using Sorbact we have gradually improve the wound surface with no improvement in surface area. She is approved for Apligraf but the wound surface still is not completely viable. She has not had to come in for a dressing change 7/8; the patient has an appointment with vein and vascular on 7/31 which is a Friday afternoon. She is concerned about getting back here for [place] to dress her  wounds. I think it is important to have them goal for her venous reflux/history of ablations etc. to see if anything else can be done. She apparently tested positive for 1 of the blood tests with regards to lupus and saw a rheumatologist. He has raised the issue of vasculitis again. I have had this thought in the past however the evidence seems overwhelming that this is a venous reflux etiology. If the rheumatologist tells me there is clinical and laboratory investigation is positive for lupus I will rethink this. 7/15; the patient's wound surfaces are quite a bit better. The medial area which was her original wound now has no depth although the lateral wound which was the more recent area actually appears larger. Both with viable surfaces which is indeed better. Using Sorbact. I wanted to use Apligraf on her however there is the issue of the vein and vascular appointment on 7/31 at 2:00 in the afternoon which would not allow her to get back to be rewrapped and they would no doubt remove the graft 7/22; the patient's wound surfaces have moderate amount of debris although generally look better. The lateral one is larger with 2 small satellite areas superiorly. We are waiting for her vein and vascular appointment on 7/31. She has been approved for Apligraf which I would like to use after th 7/29; wound surfaces have improved no debridement is required we have been using Sorbact. She sees vein and vascular on Friday with this so question of whether anything can be done to lessen the likelihood of recurrence and/or speed the healing of these areas. She is already had previous ablations. She no doubt has severe venous hypertension 8/5-Patient returns at 1 week, she was in [place] for 3 days by her podiatrist, we have been using so backed to the wound, she has increased pain in both the wounds on the left lower leg especially the more distal one on the lateral aspect 8/12-Patient returns at 1 week and  she is agreeable to having debridement in both wounds on her left leg today. We have been using Sorbact, and vascular studies were reviewed at last visit 8/19; the patient arrives with her wounds fairly clean and no debridement is required. We have used Sorbact which is really done a nice job in cleaning up these very difficult wound surfaces. The patient saw Dr. Donzetta Matters of vascular surgery on 7/31. He did not feel that there was an arterial component. He felt that her treated  greater saphenous vein is adequately addressed and that the small saphenous vein did not appear to be involved significantly. She was also noted to have deep venous reflux which is not treatable. Dr. Donzetta Matters mentioned the possibility of a central obstructive component leading to reflux and he offered her central venography. She wanted to discuss this or think about it. I have urged her to go ahead with this. She has had recurrent difficult wounds in these areas which do heal but after months in the clinic. If there is anything that can be done to reduce the likelihood of this I think it is worth it. 9/2 she is still working towards getting follow-up with Dr. Donzetta Matters to schedule her CT. Things are quite a bit worse venography. I put Apligraf on 2 weeks ago on both wounds on the medial and lateral part of her left lower leg. She arrives in clinic today with 3 superficial additional wounds above the area laterally and one below the wound medially. She describes a lot of discomfort. I think these are probably wrapped injuries. Does not look like she has cellulitis. 07/20/2019 on evaluation today patient appears to be doing somewhat poorly in regard to her lower extremity ulcers. She in fact showed signs of erythema in fact we may even be dealing with an infection at this time. Unfortunately I am unsure if this is just infection or if indeed there may be some allergic reaction that occurred as a result of the Apligraf application. With that  being said that would be unusual but nonetheless not impossible in this patient is one who is unfortunately allergic to quite a bit. Currently we have been using the Sorbact which seems to do as well as anything for her. I do think we may want to obtain a culture today to see if there is anything showing up there that may need to be addressed. 9/16; noted that last week the wounds look worse in 1 week follow-up of the Apligraf. Using Sorbact as of 2 days ago. She arrives with copious amounts of drainage and new skin breakdown on the back of the left calf. The wounds arm more substantial bilaterally. There is a fair amount of swelling in the left calf no overt DVT there is edema present I think in the left greater than right thigh. She is supposed to go on 9/28 for CT venography. The wounds on the medial and lateral calf are worse and she has new skin breakdown posteriorly at least new for me. This is almost developing into a circumferential wound area The Apligraf was taken off last week which I agree with things are not going in the right direction a culture was done we do not have that back yet. She is on Augmentin that she started 2 days ago 9/23; dressing was changed by her nurses on Monday. In general there is no improvement in the wound areas although the area looks less angry than last week. She did get Augmentin for MSSA cultured on the 14th. She still appears to have too much swelling in the left leg even with 3 layer compression 9/30; the patient underwent her procedure on 9/28 by Dr. Donzetta [facility]ters at [facility]. She was discovered to have the common iliac vein measuring 12.2 mm but at the level of L4-L5 measured 3 mm. After stenting it measured 10 mm. 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 Rouleaux flow in the common femoral and femoral vein was observed much improved  after stenting. We are using silver alginate to the wounds on the medial and lateral ankle on  the left. 4 layer compression 10/7; the patient had fluid swelling around her knee and 4 layer compression. At the advice of vein and vascular this was reduced to 3 layer which she is tolerating better. We have been using silver alginate under 3 layer compression since last Friday 10/14; arrives with the areas on the left ankle looking a lot better. Inflammation in the area also a lot better. She came in for a nurse check on 10/9 10/21; continued nice improvement. Slight improvements in surface area of both the medial and lateral wounds on the left. A lot of the satellite lesions in the weeping erythema around these from stasis dermatitis is resolved. We have been using silver alginate [name], [name] ([id number]) 10/28; general improvement in the entire wound areas although not a lot of change in dimensions the wound certainly looks better. There is a lot less in terms of venous inflammation. Continue silver alginate this week however look towards Hydrofera Blue next week 11/4; very adherent debris on the medial wound left wound is not as bad. We have been using silver alginate. Change to [facility] today 11/11; very adherent debris on both wound areas. She went to vein and vascular last week and follow-up they put in Catharine boot on this today. He says the [facility] was adherent. Wound is definitely not as good as last week. Especially on the left there the satellite lesions look more prominent 11/18; absolutely no better. erythema on lateral aspect with tenderness. 09/30/2019 on evaluation today patient appears to actually be doing better. Dr. Dellia Nims did put her on doxycycline last week which I do believe has helped her at this point. Fortunately there is no signs of active infection at this time. No fevers, chills, nausea, vomiting, or diarrhea. I do believe he may want extend the doxycycline for 7 additional days just to ensure everything does completely cleared up the patient is  in agreement with that plan. Otherwise she is going require some sharp debridement today 12/2; patient is completing a 2-week course of doxycycline. I gave her this empirically for inflammation as well as infection when I last saw her 2 weeks ago. All of this seems to be better. She is using silver alginate she has the area on the medial aspect of the larger area laterally and the 2 small satellite regions laterally above the major wound. 12/9; the patient's wound on the left medial and left lateral calf look really quite good. We have been using silver alginate. She saw vein and vascular in follow-up on 10/09/2019. She has had a previous left greater saphenous vein ablation by Dr. Oscar La in 2016. More recently she underwent a left common iliac vein stent by Dr. 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 The swelling is improved and certainly the wounds have improved. The patient shows [place] today area on the right medial calf there is almost no wound but leaking lymphedema. She says she start this started 3 or 4 days ago. She did not traumatize it. It is not painful. She does not wear compression on that side 12/16; the patient continues to do well laterally. Medially still requiring debridement. The area on the right calf did not materialize to anything and is not currently open. We wrapped this last time. She has support stockings for that leg although I am not sure they are going to provide adequate  compression 12/23; the lateral wound looks stable. Medially still requiring debridement for tightly adherent fibrinous debris. We've been using silver alginate. Surface area not any different 12/30; neither wound is any better with regards to surface and the area on the left lateral is larger. I been using silver alginate to the left lateral which look quite good last week and Sorbact to the left medial 11/11/2019. Lateral wound area actually looks better and somewhat smaller. Medial still  requires a very aggressive debridement today. We have been using Sorbact on both wound areas 1/13; not much better still adherent debris bilaterally. I been using Sorbact. She has severe venous hypertension. Probably some degree of dermal fibrosis distally. I wonder whether tighter compression might help and I am going to try that today. We also need to work on the bioburden 1/20; using Sorbact. She has severe venous hypertension status post stent placement for pelvic vein compression. We applied gentamicin last time to see if we could reduce bioburden I had some discussion with her today about the use of pentoxifylline. This is occasionally used in this setting for wounds with refractory venous insufficiency. However this interacts with Plavix. She tells me that she was put on this after stent placement for 3 months. She will call Dr. Claretha Cooper office to discuss 1/27; we are using gentamicin under Sorbact. 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 She has a stent. Wound medially is measuring smaller this week. Laterally measuring slightly larger although she has some satellite lesions superiorly 2/3; gentamicin under Sorbact under 4-layer compression. 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 She has a stent on Plavix. Her wounds are measuring smaller this week. More substantially laterally where there is a satellite lesion superiorly. 2/10; gentamicin under Sorbac. 4-layer compression. Patient communicated with Dr. Donzetta Matters at vein and vascular in [place]. He is okay with the patient coming off Plavix I will therefore start her on pentoxifylline for a 1 month trial. In general her wounds look better today. I had some concerns about swelling in the left thigh however she measures 61.5 on the right and 63 on the mid thigh which does not suggest there is any difficulty. The patient is not describing any pain. 2/17; gentamicin under Sorbac 4-layer  compression. She has been on pentoxifylline for 1 week and complains of loose stool. No nausea she is eating and drinking well 2/24; the patient apparently came in 2 days ago for a nurse visit when her wrap fell down. Both areas look a little worse this week macerated medially and satellite lesions laterally. Change to silver alginate today 3/3; wounds are larger today especially medially. She also has more swelling in her foot lower leg and I even noted some swelling in her posterior thigh which is tender. I wonder about the patency of her stent. Fortuitously she sees Dr. Claretha Cooper group on Friday 3/10; Mrs. Greenlaw was seen by vein and vascular on 3/5. The patient underwent ultrasound. There was no evidence of thrombosis involving the IVC no evidence of thrombosis involving the right common iliac vein there is no evidence of thrombosis involving the right external iliac vein the left external vein is also patent. The right common iliac vein stent appears patent bilateral common femoral veins are compressible and appear patent. I was concerned about the left common iliac stent however it looks like this is functional. She has some edema in the posterior thigh that was tender she still has that this week. I also note they  had trouble finding the pulses in her left foot and booked her for an ABI baseline in 4 weeks. She will follow up in 6 months for repeat IVC duplex. The patient stopped the pentoxifylline because of diarrhea. It does not look like that was being effective in any case. I have advised her to go back on her aspirin 81 mg tablet, vascular it also suggested this 3/17; comes in today with her wound surfaces a lot better. The excoriations from last week considerably better probably secondary to the TCA. We have been using silver alginate 3/24; comes in today with smaller wounds both medially and laterally. Both required debridement. There are 2 small satellite areas  superiorly laterally. She also has a very odd bandlike area in the mid calf almost looking like there was a weakness in the wrap in a localized area. I would write this off as being this however anteriorly she has a small raised ballotable area that is very tender almost reminiscent of an abscess but there was no obvious purulent surface to it. 02/04/20 upon evaluation today patient appears to be doing fairly well in regard to her wounds today. Fortunately there is no signs of active infection at this time. No fevers, chills, nausea, vomiting, or diarrhea. She has been tolerating the dressing changes without complication. Fortunately I feel like she is showing signs of improvement although has been sometime since have seen her. Nonetheless the area of concern that Dr. Dellia Nims had last week where she had possibly an area of the wrap that was we can allow the leg to bulge appears to be doing significantly better today there is no signs of anything worsening. [name], [name] ([id number]) 4/7; the patient's wounds on her medial and lateral left leg continue to contract. We have been using a regular alginate. Last week she developed an area on the right medial lower leg which is probably a venous ulcer as well. 4/14; the wounds on her left medial and lateral lower leg continue to contract. Surface eschar. We have been using regular alginate. The area on the right medial lower leg is closed. We have been putting both legs under 4-layer contraction. The patient went back to see vein and vascular she had arterial studies done which were apparently "quite good" per the patient although I have not read their notes I have never felt she had an arterial issue. 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 This is been longstanding and refractory to exercise, leg elevation and longstanding use of compression wraps in our clinic as well as compression stockings on the times  we have been able to get these to heal 4/21; we thought she actually might be close this week however she arrives in clinic with a lot of edema in her upper left calf and into her posterior thigh. This is been an intermittent problem here. She says the wrap fell down but it was replaced with a nurse visit on Monday. We are using calcium alginate to the wounds and the wound sizes there not terribly larger than last week but there is a lot more edema 4/28; again wound edges are smaller on both sides. Her edema is better controlled than last time. She is obtained her compression pumps from [employer] although they have not been to her home to set these up. 5/5; left medial and left lateral both look stable. I am not sure the medial is any smaller. We have been using calcium alginate under  4-layer compression. She had an area on the right medial. This was eschared today. We have been wrapping this as well. She does not tolerate external compression stockings due to a history of various contact allergies. She has her compression pumps however the representative from the company is coming on her to show her how to use these tomorrow 5/19; patient with severe chronic venous insufficiency secondary to central venous disease. She had a stent placed in her left common iliac vein. She has done better since but still difficult to control wounds. She comes in today with nothing open on the right leg. Her areas on the left medial and left lateral are just about closed. We are using calcium alginate under 4-layer compression. She is using her external compression pumps at home She only has 15-20 support stockings. States she cannot get anything tighter than that on. 03/30/20-Patient returns at 1 week, the wounds on the left leg are both slightly bigger, the last week she was on 3 layer compression which started to slide down. She is starting to use her lymphedema pumps although she stated on 1 day her right  ankle started to swell up and she have to stop that day. Unfortunately the open area seem to oscillate between improving to the point of healing and then flaring up all to do with effectiveness of compression or lack of due to the left leg topography not keeping the compression wraps from rolling down 6/2 patient comes in with a 15/20 mmHg stocking on the right leg. She tells me that she developed a lot of swelling in her ankles she saw orthopedics she was felt to possibly be having a flare of pseudogout versus some other type of arthritis. She was put on steroids for a respiratory issue so that helps with the inflammation. She has not been using the pumps all week. She thinks the left thigh is more swollen than usual and I would agree with that. She has an appointment with Dr. Donzetta Matters 9 days or so from now 6/9; both wounds on the left medial and left lateral are smaller. We have been using calcium alginate under compression. She does not have an open wound on the right leg she is using a stocking and her compression pumps things are going well. She has an appointment with Dr. Donzetta Matters with regards to her stent in the left common iliac vein 6/16; the wounds on the left medial and left lateral ankle continues to contract. The patient saw Dr. Donzetta Matters and I think he seems satisfied. Ordered follow-up venous reflux studies on both sides in September. Cautioned that she may need thigh-high stockings. She has been using calcium alginate under compression on the left and her own stocking on the right leg. She tells [place] there are no open wounds on the right 6/23; left lateral is just about closed. Medial required debridement today. We have been using calcium alginate. Extensive discussion about the compression pumps she is only using these on 25 mmHg states she could not take 40 or 30 when the wrap came out to her home to demonstrate these. He said they should not feel tight 6/30; the left lateral wound has a slight  amount of eschar. . The area medially is about the same using Hydrofera Blue. 7/7; left lateral wound still has some eschar. I will remove this next week may be closed. The area medially is very small using Hydrofera Blue with improvement. Unfortunately the stockings fell down. Unfortunately the blisters have developed  at the edge of where the wrap fell. When this happened she says her legs hurt she did not use her pumps. We are not open Monday for her to come in and change the wraps and she had an appointment yesterday. She also tells me that she is going to have an MRI of her back. She is having pain radiating into her left anterior leg she thinks her from an L5 disc. She saw Dr. Ellene Route of neurosurgery 7/14; the area on the left lateral ankle area is closed. Still a small area medially however it looks better as well. We have been using Hydrofera Blue under 4-layer compression 7/21; left lateral ankle is still closed however her wound on the medial left calf is actually larger. This is probably because Hydrofera Blue got stuck to the wound. She came in for a nurse change on Friday and will do that again this week I was concerned about the amount of swelling that she had last week however she is using her compression pumps twice a day and the swelling seems well controlled 7/28; remaining wound on the left medial lower leg is smaller. We have been using moistened silver collagen under compression she is coming back for a nurse visit. For reasons that were not really clear she was just keeping her legs elevated and not using her compression pumps. I have asked her to use the compression pumps. She does not have any wounds on the right leg 06/15/20-Patient returns at 2 weeks, her LLE edema is worse and she developed a blister wound that is new and has bigger posterior calf wound on right, we are using Prisma with pad, 4 layer compression. she has been on lasix 40 mg daily 8/18; patient arrives today  with things a lot worse than I remember from a few weeks ago. She was seen last week. Noted that her edema was worse and that she now had a left lateral wound as well as deteriorating edema in the medial and posterior part of the lower leg. She says she is using his or her external compression pumps once a day although I wonder about the compliance. 8/25; weeping area on the right medial lower leg. This had actually gotten a small localized area of her compression stocking wet. On the left side there is a large denuded area on the posterior medial lower leg and smaller area on the lateral. This was not the original areas that we dealt with. 9/1 the patient's wound on the left leg include the left lateral and left posterior. Larger superficial wounds weeping. She has very poor edema control. Tender localized edema in the left lower medial ankle/heel probably because of localized wrap issues. She freely admits she is not using the compression pumps. She has been up on her feet a lot. She thinks the hydrofera blue is contributing to the pain she is experiencing.. This is a complaint that I have occasionally heard 9/8; really not much improvement. The patient is still complaining of a lot of pain particularly when she uses compression pumps. I switched her to silver alginate last time because she found the Hydrofera Blue to be irritating. I don't hear much difference in her description with the silver alginate. 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 She has follow-up with Dr. Donzetta Matters I think for ultrasound next month DANNICA, BICKHAM ([id number]) 9/15; quite a bit of improvement today.  We have less edema and more epithelialization in both of her wound areas on the left medial and left lateral calf. These are not the site of her original wounds in this area. She says she has been using her compression pumps for 30 minutes twice  a day, there pain issues that never quite understood. Silver alginate as the primary dressing 9/22; continued improvement. Both areas medially and laterally still have a small open area there is some eschar. She continues to complain of left medial ankle pain. Swelling in the leg is in much better condition. We have been using silver alginate Objective Constitutional Patient is hypertensive.. Pulse regular and within target range for patient.Marland Kitchen Respirations regular, non-labored and within target range.. Temperature is normal and within the target range for the patient.Marland Kitchen appears in no distress. Vitals Time Taken: 12:35 PM, Height: 63 in, Weight: 224.7 lbs, BMI: 39.8, Temperature: 98.4 F, Pulse: 79 bpm, Respiratory Rate: 18 breaths/min, Blood Pressure: 156/71 mmHg. Cardiovascular Pedal pulses are palpable. Edema present in both extremities. However much better including the thigh. Still an area of localized edema just below the knee in a band with distribution. General Notes: Wound exam; continued improvement in the wounded area on the medial there is still some eschared areas but they seem to be mostly covering normal epithelialization and lateral left calf distally. Integumentary (Hair, Skin) Wound #12 status is Open. Original cause of wound was Gradually Appeared. The wound is located on the Left,Lateral Lower Leg. The wound measures 5cm length x 4cm width x 0.1cm depth; 15.708cm^2 area and 1.571cm^3 volume. There is Fat Layer (Subcutaneous Tissue) exposed. There is a medium amount of serosanguineous drainage noted. There is small (1-33%) red granulation within the wound bed. There is a large (67-100%) amount of necrotic tissue within the wound bed including Adherent Slough. Wound #5 status is Open. Original cause of wound was Gradually Appeared. The wound is located on the Left,Medial Lower Leg. The wound measures 5.8cm length x 5.2cm width x 0.1cm depth; 23.688cm^2 area and 2.369cm^3 volume.  There is Fat Layer (Subcutaneous Tissue) exposed. There is a medium amount of serosanguineous drainage noted. There is small (1-33%) red granulation within the wound bed. There is a large (67-100%) amount of necrotic tissue within the wound bed including Adherent Slough. 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 There was a Four Layer Compression Therapy Procedure by [name] Canavan, RN. Post procedure Diagnosis Wound #12: Same as Pre-Procedure AVIRA, TILLISON ([id number]) Plan Wound Cleansing: Wound #12 Left,Lateral Lower Leg: Dial antibacterial soap, wash wounds, rinse and pat dry prior to dressing wounds Wound #5 Left,Medial Lower Leg: Dial antibacterial soap, wash wounds, rinse and pat dry prior to dressing wounds Anesthetic (add to Medication List): Wound #12 Left,Lateral Lower Leg: Topical Lidocaine 4% cream applied to wound bed prior to debridement (In Clinic Only). - if needed. Wound #5 Left,Medial Lower Leg: Topical Lidocaine 4% cream applied to wound bed prior to debridement (In Clinic Only). - if needed. Skin Barriers/Peri-Wound Care: Wound #12 Left,Lateral Lower  Leg: Triamcinolone Acetonide Ointment (TCA) Wound #5 Left,Medial Lower Leg: Triamcinolone Acetonide Ointment (TCA) Primary Wound Dressing: Wound #12 Left,Lateral Lower Leg: Silver Alginate Wound #5 Left,Medial Lower Leg: Silver Alginate Secondary  Dressing: Wound #12 Left,Lateral Lower Leg: ABD pad Wound #5 Left,Medial Lower Leg: ABD pad Dressing Change Frequency: Wound #12 Left,Lateral Lower Leg: Change dressing every week Other: - Nurse visit Friday and Monday if needed. Wound #5 Left,Medial Lower Leg: Change dressing every week Other: - Nurse visit Friday and Monday if needed. Follow-up Appointments: Wound #12 Left,Lateral Lower Leg: Return Appointment in 1 week. Nurse Visit as needed Wound #5 Left,Medial Lower Leg: Return Appointment in 1 week. Nurse Visit as needed Edema Control: Wound #12 Left,Lateral Lower Leg: 4-Layer Compression System - Left Lower Extremity. Patient to wear own compression stockings - Right leg Compression Pump: Use compression pump on left lower extremity for 60 minutes, twice daily. - at least 45 minutes Compression Pump: Use compression pump on right lower extremity for 60 minutes, twice daily. - at least 45 minutes Wound #5 Left,Medial Lower Leg: 4-Layer Compression System - Left Lower Extremity. Patient to wear own compression stockings - Right leg Compression Pump: Use compression pump on left lower extremity for 60 minutes, twice daily. - at least 45 minutes Compression Pump: Use compression pump on right lower extremity for 60 minutes, twice daily. - at least 45 minutes 1. I am going to continue with the same dressing that involves silver alginate to the still open areas TCA ABDs 4-layer compression 2. The edema in her left leg including the thigh is a lot better. This does not leave me any level of concern about her venous stent however she does follow up with vein and vascular on 10/15 3. I asked her to use her compression pumps up to 45 minutes twice a day she could not do this because of discomfort 4. Once again the areas on this leg may be progressing towards closure. I have asked her to bring her stocking next week but I have warned her that if she does not use the compression pumps  aggressively at home this will not hold and she is likely to have further breakdown from her uncontrolled venous hypertension Electronic Signature(s) Signed: 08/01/2020 9:55:55 AM By: Linton Ham MD Entered By: Linton Ham on 07/27/2020 13:31:46 Chiu, [name] ([id number]) -------------------------------------------------------------------------------- SuperBill Details Patient Name: AVANNI, TURNBAUGH. Date of Service: 07/27/2020 Medical Record Number: [id number] Patient Account Number: [id number] Date of Birth/Sex: 1946-01-30 (74 y.o. 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 Physician Procedures CPT4 Code Description: 9798921 [id number] - WC PHYS LEVEL 3 - EST PT Modifier: Quantity: 1 CPT4 Code Description: ICD-10 Diagnosis Description L97.221 Non-pressure chronic ulcer of left calf limited to breakdown of skin I87.332 Chronic venous hypertension (idiopathic) with ulcer and inflammation of left  Modifier: lower extremity Quantity: Electronic Signature(s) Signed: 08/01/2020 9:55:55 AM By: Linton Ham  MD Entered By: Linton Ham on 07/27/2020 13:32:10

## 2020-08-01 NOTE — Progress Notes (Signed)
CALEYAH, JR ([id number]) Visit Report for 07/27/2020 Arrival Information Details Patient Name: ZANNA, HAWN. Date of Service: 07/27/2020 12:30 PM Medical Record Number: [id number] Patient Account Number: [id number] Date of Birth/Sex: [birth year] (74 y.o. F) Treating RN: [name] Canavan Primary Care Neldon Shepard: [name] Other Clinician: Referring Zonie Crutcher: [name] Treating Taisa Deloria/Extender: Tito Dine in Treatment: 65 Visit Information History Since Last Visit Added or deleted any medications: No Patient Arrived: Ambulatory Any new allergies or adverse reactions: No Arrival Time: 12:33 Had a fall or experienced change in No Accompanied By: self activities of daily living that may affect Transfer Assistance: None risk of falls: Patient Identification Verified: Yes Signs or symptoms of abuse/neglect since last visito No Secondary Verification Process Completed: Yes Hospitalized since last visit: No Patient Requires Transmission-Based No Implantable device outside of the clinic excluding No Precautions: cellular tissue based products placed in the center Patient Has Alerts: Yes since last visit: Patient Alerts: Patient on Blood Has Dressing in Place as Prescribed: Yes Thinner Has Compression in Place as Prescribed: Yes aspirin 81 Pain Present Now: No Electronic Signature(s) Signed: 07/27/2020 1:21:26 PM By: [name] Entered By: [name] on 07/27/2020 12:50:24 Lineberry, [name] ([id number]) -------------------------------------------------------------------------------- Compression Therapy Details Patient Name: SALEEN, PEDEN. Date of Service: 07/27/2020 12:30 PM Medical Record Number: [id number] Patient Account Number: [id number] Date of Birth/Sex: Sep 05, 1946 (74 y.o. F) Treating RN: [name] Canavan Primary Care Lakayla Barrington: [name] Other Clinician: Referring Romana Deaton: [name] Treating Monseratt Ledin/Extender:  Tito Dine in Treatment: 85 Compression Therapy Performed for Wound Assessment: Wound #12 Left,Lateral Lower Leg Performed By: Clinician [name] Canavan, RN Compression Type: Four Layer Post Procedure Diagnosis Same as Pre-procedure Electronic Signature(s) Signed: 07/27/2020 4:32:30 PM By: [name] Canavan Entered By: [name] Canavan on 07/27/2020 12:58:53 Pineo, [name] ([id number]) -------------------------------------------------------------------------------- Encounter Discharge Information Details Patient Name: ADRIEANNA, BOTELER. Date of Service: 07/27/2020 12:30 PM Medical Record Number: [id number] Patient Account Number: [id number] Date of Birth/Sex: 29-Sep-1946 (74 y.o. F) Treating RN: [name] Canavan Primary Care Sandara Tyree: [name] Other Clinician: Referring Karole Oo: [name] Treating Raylie Maddison/Extender: Tito Dine in Treatment: 110 Encounter Discharge Information Items Discharge Condition: Stable Ambulatory Status: Ambulatory Discharge Destination: Home Transportation: Private Auto Accompanied By: self Schedule Follow-up Appointment: Yes Clinical Summary of Care: Electronic Signature(s) Signed: 07/27/2020 4:32:30 PM By: [name] Canavan Entered By: [name] Canavan on 07/27/2020 12:56:48 [name] ([id number]) -------------------------------------------------------------------------------- Lower Extremity Assessment Details Patient Name: DAMETRA, WHETSEL. Date of Service: 07/27/2020 12:30 PM Medical Record Number: [id number] Patient Account Number: [id number] Date of Birth/Sex: [birth year] (74 y.o. F) Treating RN: [name] Canavan Primary Care Trulee Hamstra: [name] Other Clinician: Referring Vinessa Macconnell: [name] Treating Nieko Clarin/Extender: Tito Dine in Treatment: 85 Edema Assessment Assessed: [Left: Yes] [Right: No] Edema: [Left: Ye] [Right: s] Calf Left: Right: Point of Measurement: 33 cm  From Medial Instep 36 cm cm Ankle Left: Right: Point of Measurement: 13 cm From Medial Instep 22 cm cm Vascular Assessment Pulses: Dorsalis Pedis Palpable: [Left:Yes] Posterior Tibial Palpable: [Left:Yes] Electronic Signature(s) Signed: 07/27/2020 1:21:26 PM By: [name] Signed: 07/27/2020 4:32:30 PM By: [name] Canavan Entered By: [name] on 07/27/2020 12:55:28 Azam, [name] ([id number]) -------------------------------------------------------------------------------- Multi Wound Chart Details Patient Name: SHARINA, PETRE. Date of Service: 07/27/2020 12:30 PM Medical Record Number: [id number] Patient Account Number: [id number] Date of Birth/Sex: 1946-01-29 (74 y.o. F) Treating RN: [name] Canavan Primary Care Milus Fritze: [name] Other Clinician: Referring Jakerria Kingbird: [name] Treating Doris Gruhn/Extender: [name] Dillon Weeks in Treatment: 19  Vital Signs Height(in): 63 Pulse(bpm): 22 Weight(lbs): 224.7 Blood Pressure(mmHg): 156/71 Body Mass Index(BMI): 40 Temperature(F): 98.4 Respiratory Rate(breaths/min): 18 Photos: [N/A:N/A] Wound Location: Left, Lateral Lower Leg Left, Medial Lower Leg N/A Wounding Event: Gradually Appeared Gradually Appeared N/A Primary Etiology: Venous Leg Ulcer Lymphedema N/A Comorbid History: Cataracts, Asthma, Sleep Apnea, Cataracts, Asthma, Sleep Apnea, N/A Deep Vein Thrombosis, Deep Vein Thrombosis, Hypertension, Peripheral Venous Hypertension, Peripheral Venous Disease, Osteoarthritis, Received Disease, Osteoarthritis, Received Chemotherapy, Received Radiation Chemotherapy, Received Radiation Date Acquired: 06/15/2020 11/19/2018 N/A Weeks of Treatment: 6 85 N/A Wound Status: Open Open N/A Measurements L x W x D (cm) 5x4x0.1 5.8x5.2x0.1 N/A Area (cm) : 15.708 23.688 N/A Volume (cm) : 1.571 2.369 N/A % Reduction in Area: -2680.20% -177.20% N/A % Reduction in Volume: -2656.10% -177.10% N/A Classification:  Partial Thickness Full Thickness Without Exposed N/A Support Structures Exudate Amount: Medium Medium N/A Exudate Type: Serosanguineous Serosanguineous N/A Exudate Color: red, brown red, brown N/A Granulation Amount: Small (1-33%) Small (1-33%) N/A Granulation Quality: Red Red N/A Necrotic Amount: Large (67-100%) Large (67-100%) N/A Exposed Structures: Fat Layer (Subcutaneous Tissue): Fat Layer (Subcutaneous Tissue): N/A Yes Yes Fascia: No Fascia: No Tendon: No Tendon: No Muscle: No Muscle: No Joint: No Joint: No Bone: No Bone: No Epithelialization: Small (1-33%) N/A N/A Procedures Performed: Compression Therapy N/A N/A Treatment Notes Wound #12 (Left, Lateral Lower Leg) Notes scell, abd, 4LL, unna anchor REGLA, FITZGIBBON. ([id number]) Wound #5 (Left, Medial Lower Leg) Notes scell, abd, 4LL, unna anchor Electronic Signature(s) Signed: 08/01/2020 9:55:55 AM By: Linton Ham MD Entered By: Linton Ham on 07/27/2020 13:27:09 [name] ([id number]) -------------------------------------------------------------------------------- Multi-Disciplinary Care Plan Details Patient Name: WYONIA, FONTANELLA. Date of Service: 07/27/2020 12:30 PM Medical Record Number: [id number] Patient Account Number: [id number] Date of Birth/Sex: Mar 17, 1946 (75 y.o. F) Treating RN: [name] Canavan Primary Care Kristal Perl: [name] Other Clinician: Referring Manoah Deckard: [name] Treating Lacrecia Delval/Extender: Tito Dine in Treatment: 19 Active Inactive Medication Nursing Diagnoses: Knowledge deficit related to medication safety: actual or potential Goals: Patient/caregiver will demonstrate understanding of new oral/IV medications prescribed at the [facility] (topical prescriptions are covered under the skin breakdown problem) Date Initiated: 12/16/2019 Target Resolution Date: 01/13/2020 Goal Status: Active Interventions: Assess for medication contraindications each visit  where new medications are prescribed Treatment Activities: New medication prescribed at [place] : 12/16/2019 Notes: Soft Tissue Infection Nursing Diagnoses: Impaired tissue integrity Goals: Patient's soft tissue infection will resolve Date Initiated: 12/10/2018 Target Resolution Date: 01/09/2019 Goal Status: Active Interventions: Assess signs and symptoms of infection every visit Notes: Venous Leg Ulcer Nursing Diagnoses: Actual venous Insuffiency (use after diagnosis is confirmed) Goals: Patient will maintain optimal edema control Date Initiated: 12/10/2018 Target Resolution Date: 01/09/2019 Goal Status: Active Interventions: Assess peripheral edema status every visit. Treatment Activities: Therapeutic compression applied : 12/10/2018 Notes: Wound/Skin Impairment Nursing Diagnoses: HOPIE, PELLEGRIN ([id number]) Impaired tissue integrity Goals: Patient/caregiver will verbalize understanding of skin care regimen Date Initiated: 12/10/2018 Target Resolution Date: 01/09/2019 Goal Status: Active Interventions: Assess ulceration(s) every visit Treatment Activities: Topical wound management initiated : 12/10/2018 Notes: Electronic Signature(s) Signed: 07/27/2020 4:32:30 PM By: [name] Canavan Entered By: [name] Canavan on 07/27/2020 12:57:13 Sherr, [name] ([id number]) -------------------------------------------------------------------------------- Pain Assessment Details Patient Name: ISMELDA, WEATHERMAN. Date of Service: 07/27/2020 12:30 PM Medical Record Number: [id number] Patient Account Number: [id number] Date of Birth/Sex: 07-May-1946 (74 y.o. F) Treating RN: [name] Canavan Primary Care Kaianna Dolezal: [name] Other Clinician: Referring Quincy Boy: [name] Treating Tavian Callander/Extender: [name] Dillon Weeks in Treatment: 17 Active  Problems Location of Pain Severity and Description of Pain Patient Has Paino No Site Locations With Dressing Change: Yes Duration of  the Pain. Constant / Intermittento Intermittent Rate the pain. Current Pain Level: 3 Worst Pain Level: 5 Least Pain Level: 3 Tolerable Pain Level: 3 Character of Pain Describe the Pain: Tender Pain Management and Medication Current Pain Management: Notes Patient expresses pain with removal of dressing on each visit. Dressing saturated with warm water prior to removal to eliminate primary dressing from adhearing to ulcer. Electronic Signature(s) Signed: 07/27/2020 1:21:26 PM By: [name] Signed: 07/27/2020 4:32:30 PM By: [name] Canavan Entered By: [name] on 07/27/2020 12:52:48 [name] ([id number]) -------------------------------------------------------------------------------- Patient/Caregiver Education Details Patient Name: ADRIENNE, TROMBETTA. Date of Service: 07/27/2020 12:30 PM Medical Record Number: [id number] Patient Account Number: [id number] Date of Birth/Gender: 1946-08-22 (74 y.o. F) Treating RN: [name] Canavan Primary Care Physician: [name] Other Clinician: Referring Physician: [name] Treating Physician/Extender: Tito Dine in Treatment: 59 Education Assessment Education Provided To: Patient Education Topics Provided Wound/Skin Impairment: Methods: Explain/Verbal Responses: State content correctly Electronic Signature(s) Signed: 07/27/2020 4:32:30 PM By: [name] Canavan Entered By: [name] Canavan on 07/27/2020 12:57:57 Valli, [name] ([id number]) -------------------------------------------------------------------------------- Wound Assessment Details Patient Name: FERN, CANOVA. Date of Service: 07/27/2020 12:30 PM Medical Record Number: [id number] Patient Account Number: [id number] Date of Birth/Sex: [birth year] (74 y.o. F) Treating RN: [name] Canavan Primary Care Edouard Gikas: [name] Other Clinician: Referring Fontella Shan: [name] Treating Lyla Jasek/Extender: Tito Dine  in Treatment: 86 Wound Status Wound Number: 12 Primary Venous Leg Ulcer Etiology: Wound Location: Left, Lateral Lower Leg Wound Open Wounding Event: Gradually Appeared Status: Date Acquired: 06/15/2020 Comorbid Cataracts, Asthma, Sleep Apnea, Deep Vein Thrombosis, Weeks Of Treatment: 6 History: Hypertension, Peripheral Venous Disease, Osteoarthritis, Clustered Wound: No Received Chemotherapy, Received Radiation Photos Wound Measurements Length: (cm) 5 Width: (cm) 4 Depth: (cm) 0.1 Area: (cm) 15.708 Volume: (cm) 1.571 % Reduction in Area: -2680.2% % Reduction in Volume: -2656.1% Epithelialization: Small (1-33%) Wound Description Classification: Partial Thickness F Exudate Amount: Medium S Exudate Type: Serosanguineous Exudate Color: red, brown oul Odor After Cleansing: No lough/Fibrino No Wound Bed Granulation Amount: Small (1-33%) Exposed Structure Granulation Quality: Red Fascia Exposed: No Necrotic Amount: Large (67-100%) Fat Layer (Subcutaneous Tissue) Exposed: Yes Necrotic Quality: Adherent Slough Tendon Exposed: No Muscle Exposed: No Joint Exposed: No Bone Exposed: No Treatment Notes Wound #12 (Left, Lateral Lower Leg) Notes scell, abd, 4LL, unna anchor Electronic Signature(s) Signed: 07/27/2020 1:21:26 PM By: Gena Fray ([id number]) Signed: 07/27/2020 4:32:30 PM By: [name] Canavan Entered By: [name] on 07/27/2020 12:54:20 Sproule, [name] ([id number]) -------------------------------------------------------------------------------- Wound Assessment Details Patient Name: ALIESHA, DOLATA. Date of Service: 07/27/2020 12:30 PM Medical Record Number: [id number] Patient Account Number: [id number] Date of Birth/Sex: 1946/06/27 (74 y.o. F) Treating RN: [name] Canavan Primary Care Jaxx Huish: [name] Other Clinician: Referring Rosaland Shiffman: [name] Treating Dilon Lank/Extender: Tito Dine in Treatment:  31 Wound Status Wound Number: 5 Primary Lymphedema Etiology: Wound Location: Left, Medial Lower Leg Wound Open Wounding Event: Gradually Appeared Status: Date Acquired: 11/19/2018 Comorbid Cataracts, Asthma, Sleep Apnea, Deep Vein Thrombosis, Weeks Of Treatment: 85 History: Hypertension, Peripheral Venous Disease, Osteoarthritis, Clustered Wound: No Received Chemotherapy, Received Radiation Photos Wound Measurements Length: (cm) 5.8 Width: (cm) 5.2 Depth: (cm) 0.1 Area: (cm) 23.688 Volume: (cm) 2.369 % Reduction in Area: -177.2% % Reduction in Volume: -177.1% Wound Description Classification: Full Thickness Without Exposed Support Structures Exudate Amount: Medium Exudate Type: Serosanguineous Exudate  Color: red, brown Foul Odor After Cleansing: No Slough/Fibrino No Wound Bed Granulation Amount: Small (1-33%) Exposed Structure Granulation Quality: Red Fascia Exposed: No Necrotic Amount: Large (67-100%) Fat Layer (Subcutaneous Tissue) Exposed: Yes Necrotic Quality: Adherent Slough Tendon Exposed: No Muscle Exposed: No Joint Exposed: No Bone Exposed: No Treatment Notes Wound #5 (Left, Medial Lower Leg) Notes scell, abd, 4LL, unna anchor Electronic Signature(s) Signed: 07/27/2020 1:21:26 PM By: Gena Fray ([id number]) Signed: 07/27/2020 4:32:30 PM By: [name] Canavan Entered By: [name] on 07/27/2020 12:54:49 Ghattas, [name] ([id number]) -------------------------------------------------------------------------------- Vitals Details Patient Name: LYDIANA, MILLEY. Date of Service: 07/27/2020 12:30 PM Medical Record Number: [id number] Patient Account Number: [id number] Date of Birth/Sex: 06/12/1946 (74 y.o. F) Treating RN: [name] Canavan Primary Care Luba Matzen: [name] Other Clinician: Referring Liora Myles: [name] Treating Leanore Biggers/Extender: Tito Dine in Treatment: 85 Vital Signs Time Taken:  12:35 Temperature (F): 98.4 Height (in): 63 Pulse (bpm): 79 Weight (lbs): 224.7 Respiratory Rate (breaths/min): 18 Body Mass Index (BMI): 39.8 Blood Pressure (mmHg): 156/71 Reference Range: 80 - 120 mg / dl Electronic Signature(s) Signed: 07/27/2020 1:21:26 PM By: [name] Entered By: [name] on 07/27/2020 12:50:29

## 2020-08-01 NOTE — Progress Notes (Signed)
[name], [name] ([id number]) Visit Report for 08/01/2020 Arrival Information Details Patient Name: [name], [name]. Date of Service: 08/01/2020 1:00 PM Medical Record Number: [id number] Patient Account Number: [id number] Date of Birth/Sex: [birth year] (74 y.o. F) Treating RN: [name] Canavan Primary Care Emireth Cockerham: [name] Other Clinician: Referring Emorie Mcfate: [name] Treating Connar Keating/Extender: Melburn Hake, HOYT Weeks in Treatment: 58 Visit Information History Since Last Visit Added or deleted any medications: No Patient Arrived: Ambulatory Had a fall or experienced change in No Arrival Time: 13:17 activities of daily living that may affect Accompanied By: self risk of falls: Transfer Assistance: None Hospitalized since last visit: No Patient Identification Verified: Yes Has Compression in Place as Prescribed: Yes Patient Requires Transmission-Based No Pain Present Now: No Precautions: Patient Has Alerts: Yes Patient Alerts: Patient on Blood Thinner aspirin 81 Electronic Signature(s) Signed: 08/01/2020 4:49:03 PM By: [name], [name], RN, CWS, [name], [name] Entered By: [name], [name], RN, CWS, Kim on 08/01/2020 16:49:03 [name], [name] ([id number]) -------------------------------------------------------------------------------- Compression Therapy Details Patient Name: [name], [name]. Date of Service: 08/01/2020 1:00 PM Medical Record Number: [id number] Patient Account Number: [id number] Date of Birth/Sex: [birth year] (74 y.o. F) Treating RN: Cornell Barman Primary Care Aariz Maish: [name] Other Clinician: Referring Tyree Vandruff: [name] Treating Kaysan Peixoto/Extender: Melburn Hake, HOYT Weeks in Treatment: 85 Compression Therapy Performed for Wound Assessment: Wound #12 Left,Lateral Lower Leg Performed By: Clinician Cornell Barman, RN Compression Type: Four Layer Pre Treatment ABI: 1 Notes Unna paste to anchor Electronic Signature(s) Signed: 08/01/2020 5:15:14 PM By:  [name], [name], RN, CWS, [name], [name] Previous Signature: 08/01/2020 5:11:13 PM Version By: [name], [name], RN, CWS, [name], [name] Entered By: [name], [name], RN, CWS, Kim on 08/01/2020 17:15:13 [name], [name] ([id number]) -------------------------------------------------------------------------------- Encounter Discharge Information Details Patient Name: [name], [name]. Date of Service: 08/01/2020 1:00 PM Medical Record Number: [id number] Patient Account Number: [id number] Date of Birth/Sex: 07-Feb-1946 (74 y.o. F) Treating RN: Cornell Barman Primary Care Warren Kugelman: [name] Other Clinician: Referring Doloris Servantes: [name] Treating Argie Applegate/Extender: Melburn Hake, HOYT Weeks in Treatment: 41 Encounter Discharge Information Items Discharge Condition: Stable Ambulatory Status: Ambulatory Discharge Destination: Home Transportation: Private Auto Accompanied By: self Schedule Follow-up Appointment: Yes Clinical Summary of Care: Electronic Signature(s) Signed: 08/01/2020 5:22:33 PM By: [name], [name], RN, CWS, [name], [name] Entered By: [name], [name], RN, CWS, Kim on 08/01/2020 17:22:33 [name] ([id number]) -------------------------------------------------------------------------------- Wound Assessment Details Patient Name: [name], [name]. Date of Service: 08/01/2020 1:00 PM Medical Record Number: [id number] Patient Account Number: [id number] Date of Birth/Sex: 03/04/1946 (75 y.o. F) Treating RN: Cornell Barman Primary Care Neyland Pettengill: [name] Other Clinician: Referring Redell Nazir: [name] Treating Yena Tisby/Extender: Melburn Hake, HOYT Weeks in Treatment: 8 Wound Status Wound Number: 12 Primary Venous Leg Ulcer Etiology: Wound Location: Left, Lateral Lower Leg Wound Open Wounding Event: Gradually Appeared Status: Date Acquired: 06/15/2020 Comorbid Cataracts, Asthma, Sleep Apnea, Deep Vein Thrombosis, Weeks Of Treatment: 6 History: Hypertension, Peripheral Venous Disease,  Osteoarthritis, Clustered Wound: No Received Chemotherapy, Received Radiation Wound Measurements Length: (cm) 0.1 Width: (cm) 0.1 Depth: (cm) 0.1 Area: (cm) 0.008 Volume: (cm) 0.001 % Reduction in Area: 98.6% % Reduction in Volume: 98.2% Epithelialization: Large (67-100%) Tunneling: No Undermining: No Wound Description Classification: Partial Thickness Wound Margin: Indistinct, nonvisible Exudate Amount: Small Exudate Type: Serous Exudate Color: amber Foul Odor After Cleansing: No Slough/Fibrino No Wound Bed Granulation Amount: None Present (0%) Exposed Structure Necrotic Amount: Small (1-33%) Fascia Exposed: No Necrotic Quality: Eschar Fat Layer (Subcutaneous Tissue) Exposed: No Tendon Exposed: No Muscle Exposed:  No Joint Exposed: No Bone Exposed: No Limited to Skin Breakdown Treatment Notes Wound #12 (Left, Lateral Lower Leg) Notes scell, abd, 4LL, unna anchor Electronic Signature(s) Signed: 08/01/2020 4:51:32 PM By: [name], [name], RN, CWS, [name], [name] Entered By: [name], [name], RN, CWS, Kim on 08/01/2020 16:51:31 [name], [name] ([id number]) -------------------------------------------------------------------------------- Wound Assessment Details Patient Name: [name], [name]. Date of Service: 08/01/2020 1:00 PM Medical Record Number: [id number] Patient Account Number: [id number] Date of Birth/Sex: Nov 16, 1945 (74 y.o. F) Treating RN: Cornell Barman Primary Care Andrews Tener: [name] Other Clinician: Referring Ivanka Kirshner: [name] Treating Carrye Goller/Extender: Melburn Hake, HOYT Weeks in Treatment: 68 Wound Status Wound Number: 5 Primary Lymphedema Etiology: Wound Location: Left, Medial Lower Leg Wound Open Wounding Event: Gradually Appeared Status: Date Acquired: 11/19/2018 Comorbid Cataracts, Asthma, Sleep Apnea, Deep Vein Thrombosis, Weeks Of Treatment: 85 History: Hypertension, Peripheral Venous Disease, Osteoarthritis, Clustered Wound: No Received  Chemotherapy, Received Radiation Wound Measurements Length: (cm) 0.1 Width: (cm) 0.1 Depth: (cm) 0.1 Area: (cm) 0.008 Volume: (cm) 0.001 % Reduction in Area: 99.9% % Reduction in Volume: 99.9% Epithelialization: Large (67-100%) Wound Description Classification: Full Thickness Without Exposed Support Structu Wound Margin: Indistinct, nonvisible Exudate Amount: Small Exudate Type: Serous Exudate Color: amber res Foul Odor After Cleansing: No Slough/Fibrino No Wound Bed Granulation Amount: None Present (0%) Exposed Structure Necrotic Amount: None Present (0%) Fascia Exposed: No Fat Layer (Subcutaneous Tissue) Exposed: No Tendon Exposed: No Muscle Exposed: No Joint Exposed: No Bone Exposed: No Limited to Skin Breakdown Treatment Notes Wound #5 (Left, Medial Lower Leg) Notes scell, abd, 4LL, unna anchor Electronic Signature(s) Signed: 08/01/2020 4:52:19 PM By: [name], [name], RN, CWS, [name], [name] Entered By: [name], [name], RN, CWS, Kim on 08/01/2020 16:52:19

## 2020-08-03 ENCOUNTER — Other Ambulatory Visit: Payer: Self-pay

## 2020-08-03 ENCOUNTER — Encounter: Payer: Medicare Other | Admitting: Internal Medicine

## 2020-08-03 DIAGNOSIS — L97818 Non-pressure chronic ulcer of other part of right lower leg with other specified severity: Secondary | ICD-10-CM | POA: Diagnosis not present

## 2020-08-03 DIAGNOSIS — I10 Essential (primary) hypertension: Secondary | ICD-10-CM | POA: Diagnosis not present

## 2020-08-03 DIAGNOSIS — G4733 Obstructive sleep apnea (adult) (pediatric): Secondary | ICD-10-CM | POA: Diagnosis not present

## 2020-08-03 DIAGNOSIS — I890 Lymphedema, not elsewhere classified: Secondary | ICD-10-CM | POA: Diagnosis not present

## 2020-08-03 DIAGNOSIS — L97821 Non-pressure chronic ulcer of other part of left lower leg limited to breakdown of skin: Secondary | ICD-10-CM | POA: Diagnosis not present

## 2020-08-03 DIAGNOSIS — L97221 Non-pressure chronic ulcer of left calf limited to breakdown of skin: Secondary | ICD-10-CM | POA: Diagnosis not present

## 2020-08-03 DIAGNOSIS — I872 Venous insufficiency (chronic) (peripheral): Secondary | ICD-10-CM | POA: Diagnosis not present

## 2020-08-03 NOTE — Progress Notes (Signed)
[name], [name] ([id number]) Visit Report for 08/03/2020 HPI Details Patient Name: [name], [name]. Date of Service: 08/03/2020 12:30 PM Medical Record Number: [id number] Patient Account Number: [id number] Date of Birth/Sex: 05-09-1946 (74 y.o. F) Treating RN: Cornell Barman Primary Care Provider: [name] Other Clinician: Referring Provider: [name] Treating Provider/Extender: Tito Dine in Treatment: 59 History of Present Illness HPI Description: Pleasant 74 year old with history of chronic venous insufficiency. No diabetes or peripheral vascular disease. Left ABI 1.29. Questionable history of left lower extremity DVT. She developed a recurrent ulceration on her left lateral calf in December 2015, which she attributes to poor diet and subsequent lower extremity edema. She underwent endovenous laser ablation of her left greater saphenous vein in 2010. She underwent laser ablation of accessory branch of left GSV in April 2016 by Dr. Kellie Simmering at [facility]. She was previously wearing Unna boots, which she tolerated well. Tolerating 2 layer compression and cadexomer iodine. She returns to clinic for follow-up and is without new complaints. She denies any significant pain at this time. She reports persistent pain with pressure. No claudication or ischemic rest pain. No fever or chills. No drainage. READMISSION 11/13/16; this is a 74 year old woman who is not a diabetic. She is here for a review of a painful area on her left medial lower extremity. I note that she was seen here previously last year for wound I believe to be in the same area. At that time she had undergone previously a left greater saphenous vein ablation by Dr. Kellie Simmering and she had a ablation of the anterior accessory branch of the left greater saphenous vein in March 2016. Seeing that the wound actually closed over. In reviewing the history with her today the ulcer in this area has been recurrent. She describes  a biopsy of this area in 2009 that only showed stasis physiology. 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 Do not see that she has ever had arterial studies in the left leg. She tells me she has a follow-up with Dr. Kellie Simmering in roughly 10 days In any case she developed the reopening of this area roughly a month ago. On the background of this she describes rapidly increasing edema which has responded to Lasix 40 mg and metolazone 2.5 mg as well as the patient's lymph massage. She has been told she has both venous insufficiency and lymphedema but she cannot tolerate compression stockings 11/28/16; the patient saw Dr. Kellie Simmering recently. Per the patient he did arterial Dopplers in the office that did not show evidence of arterial insufficiency, per the patient he stated "treat this like an ordinary venous ulcer". She also saw her dermatologist Dr. Ronnald Ramp who felt that this was more of a vascular ulcer. In general things are improving although she arrives today with increasing bilateral lower extremity edema with weeping a deeper fluid through the wound on the left medial leg compatible with some degree of lymphedema 12/04/16; the patient's wound is fully epithelialized but I don't think fully healed. We will do another week of depression with Promogran and TCA however I suspect we'll be able to discharge her next week. This is a very unusual-looking wound which was initially a figure-of-eight type wound lying on its side surrounded by petechial like hemorrhage. She  has had venous ablation on this side. She apparently does not have an arterial issue per Dr. Kellie Simmering. She saw her dermatologist thought it was "vascular". Patient is definitely going to need ongoing compression and I talked about  this with her today she will go to elastic therapy after she leaves here next week 12/11/16; the patient's wound is not completely closed today. She has surrounding scar tissue and in further discussion with the patient it would appear that she had ulcers in this area in 2009 for a prolonged period of time ultimately requiring a punch biopsy of this area that only showed venous insufficiency. I did not previously pickup on this part of the history from the patient. 12/18/16; the patient's wound is completely epithelialized. There is no open area here. She has significant bilateral venous insufficiency with secondary lymphedema to a mild-to-moderate degree she does not have compression stockings.. She did not say anything to me when I was in the room, she told our intake nurse that she was still having pain in this area. This isn't unusual recurrent small open area. She is going to go to elastic therapy to obtain compression stockings. 12/25/16; the patient's wound is fully epithelialized. There is no open area here. The patient describes some continued episodic discomfort in this area medial left calf. However everything looks fine and healed here. She is been to elastic therapy and caught herself 15-20 mmHg stockings, they apparently were having trouble getting 20-30 mm stockings in her size 01/22/17; this is a patient we discharged from the clinic a month ago. She has a recurrent open wound on her medial left calf. She had 15 mm support stockings. I told her I thought she needed 20-30 mm compression stockings. She tells me that she has been ill with hospitalization secondary to asthma and is been found to have severe hypokalemia likely secondary to a combination of Lasix and metolazone. This morning she noted blistering and leaking fluid on the posterior part of her left leg. She called our intake nurse urgently and we was saw her this afternoon. She has not had any real discomfort here. I don't know  that she's been wearing any stockings on this leg for at least 2-3 days. ABIs in this clinic were 1.21 on the right and 1.3 on the left. She is previously seen vascular surgery who does not think that there is a peripheral arterial issue. 01/30/17; Patient arrives with no open wound on the left leg. She has been to elastic therapy and obtained 20-14mmhg below knee stockings and she has one on the right leg today. READMISSION 02/19/18; this Cirrincione is a now 74 year old patient we've had in this clinic perhaps 3 times before. I had last looked at her from January 07 December 2016 with an area on the medial left leg. We discharged her on 12/25/16 however she had to be readmitted on 01/22/17 with a recurrence. I have in my notes that we discharged her on 20-30 mm stockings although she tells me she was only wearing support hose because she cannot get stockings on predominantly related to her cervical spine surgery/issues. She has had previous ablations done by vein and vascular in [place] including a great saphenous vein ablation on the left with an anterior accessory branch ablation I think both of these were in 2016. On one of the previous visit she had a biopsy noted 2009 that was negative. She is not felt to have an arterial issue. She is not a diabetic. She does  have a history of obstructive sleep apnea hypertension asthma as well as chronic venous insufficiency and lymphedema. On this occasion she noted 2 dry scaly patch on her left leg. She tried to put lotion on this it didn't really help. There were 2 open areas.the JAMERIA, BRADWAY ([id number]) patient has been seeing her primary physician from 02/05/18 through 02/14/18. She had Unna boots applied. The superior wound now on the lateral left leg has closed but she's had one wound that remains open on the lateral left leg. This is not the same spot as we dealt with in 2018. ABIs in this clinic were 1.3 bilaterally 02/26/18; patient has a small wound on  the left lateral calf. Dimensions are down. She has chronic venous insufficiency and lymphedema. 03/05/18; small open area on the left lateral calf. Dimensions are down. Tightly adherent necrotic debris over the surface of the wound which was difficult to remove. Also the dressing [over collagen] stuck to the wound surface. This was removed with some difficulty as well. Change the primary dressing to Hydrofera Blue ready 03/12/18; small open area on the left lateral calf. Comes in with tightly adherent surface eschar as well as some adherent Hydrofera Blue. 03/19/18; open area on the left lateral calf. Again adherent surface eschar as well as some adherent Hydrofera Blue nonviable subcutaneous tissue. She complained of pain all week even with the reduction from 4-3 layer compression I put on last week. Also she had an increase in her ankle and calf measurements probably related to the same thing. 03/26/18; open area on the left lateral calf. A very small open area remains here. We used silver alginate starting last week as the Hydrofera Blue seem to stick to the wound bed. In using 4-layer compression 04/02/18; the open area in the left lateral calf at some adherent slough which I removed there is no open area here. We are able to transition her into her own compression stocking. Truthfully I think this is probably his support hose. However this does not maintain skin integrity will be limited. She cannot put over the toe compression stockings on because of neck problems hand problems etc. She is allergic to the lining layer of juxta lites. We might be forced to use extremitease stocking should this fail READMIT 11/24/2018 Patient is now a 74 year old woman who is not a diabetic. She has been in this clinic on at least 3 previous occasions largely with recurrent wounds on her left leg secondary to chronic venous insufficiency with secondary lymphedema. Her situation is complicated by inability to  get stockings on and an allergy to neoprene which is apparently a component and at least juxta lites and other stockings. As a result she really has not been wearing any stockings on her legs. She tells [place] that roughly 2 or 3 weeks ago she started noticing a stinging sensation just above her ankle on the left medial aspect. She has been diagnosed with pseudogout and she wondered whether this was what she was experiencing. She tried to dress this with something she bought at the store however subsequently it pulled skin off and now she has an open wound that is not improving. She has been using Vaseline gauze with a cover bandage. She saw her primary doctor last week who put an Haematologist on her. ABIs in this clinic was 1.03 on the left 2/12; the area is on the left medial ankle. Odd-looking wound with what looks to be surface epithelialization but a multitude of  small petechial openings. This clearly not closed yet. We have been using silver alginate under 3 layer compression with TCA 2/19; the wound area did not look quite as good this week. Necrotic debris over the majority of the wound surface which required debridement. She continues to have a multitude of what looked to be small petechial openings. She reminds [place] that she had a biopsy on this initially during her first outbreak in 2015 in [place] [facility]. She expresses concern about this being a possible melanoma. 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 I assured her that this does not look anything like melanoma. Except for the petechial reaction it does look like a venous insufficiency area and she certainly has evidence of this on both sides 2/26; a difficult area on the left medial ankle. The patient clearly has chronic venous hypertension with some degree of lymphedema. The odd thing about the area is the small petechial hemorrhages. I am not  really sure how to explain this. This was present last time and this is not a compression injury. We have been using Hydrofera Blue which I changed to last week 3/4; still using Hydrofera Blue. Aggressive debridement today. She does not have known arterial issues. She has seen Dr. Kellie Simmering at [facility] vein [facility] vascular and and has an ablation on the left. [Anterior accessory branch of the greater saphenous]. From what I remember they did not feel she had an arterial issue. The patient has had this area biopsied in 2009 at [facility] dermatology and by her recollection they said this was "stasis". She is also follow-up with dermatology locally who thought that this was more of a vascular issue 3/11; using Hydrofera Blue. Aggressive debridement today. She does not have an arterial issue. We are using 3 layer compression although we may need to go to 4. The patient has been in for multiple changes to her wrap since I last saw her a week ago. She says that the area was leaking. I do not have too much more information on what was found 01/19/19 on evaluation today patient was actually being seen for a nurse visit when unfortunately she had the area on her left lateral lower extremity as well as weeping from the right lower extremity that became apparent. Therefore we did end up actually seeing her for a full visit with myself. She is having some pain at this site as well but fortunately nothing too significant at this point. No fevers, chills, nausea, or vomiting noted at this time. 3/18-Patient is back to the clinic with the left leg venous leg ulcer, the ulcer is larger in size, has a surface that is densely adherent with fibrinous tissue, the Hydrofera Blue was used but is densely adherent and there was difficulty in removing it. The right lower extremity was also wrapped for weeping edema. Patient has a new area over the left lateral foot above the malleolus that is small and appears to have no debris  with intact surrounding skin. Patient is on increased dose of Lasix also as a means to edema management 3/25; the patient has a nonhealing venous ulcer on the medial left leg and last week developed a smaller area on the lateral left calf. We have been using Hydrofera Blue with a contact layer. 4/1; no major change in these wounds areas. Left medial and more recently left lateral calf. I tried Iodoflex last week to aid in debridement  she did not tolerate this. She stated her pain was terrible all week. She took the top layer of the 4 layer compression off. 4/8; the patient actually looks somewhat better in terms of her more prominent left lateral calf wound. There is some healthy looking tissue here. She is still complaining of a lot of discomfort. 4/15; patient in a lot of pain secondary to sciatica. She is on a prednisone taper prescribed by her primary physician. She has the 2 areas one on the left medial and more recently a smaller area on the left lateral calf. Both of these just above the malleoli 4/22; her back pain is better but she still states she is very uncomfortable and now feels she is intolerant to the [employer]. No real change in the wounds we have been using Sorbact. She has been previously intolerant to Iodoflex. There is not a lot of option about what we can use to debride this wound under compression that she no doubt needs. sHe states Ultram no longer works for her pain 4/29; no major change in the wounds slightly increased depth. Surface on the original medial wound perhaps somewhat improved however the more recent area on the lateral left ankle is 100% covered in very adherent debris we have been using Sorbact. She tolerates 4 layer compression well and her edema control is a lot better. She has not had to come in for a nurse check 5/6; no major change in the condition of the wounds. She did consent to debridement today which was done with some difficulty. Continuing Sorbact.  She did not tolerate Iodoflex. She was in for a check of her compression the day after we wrapped her last week this was adjusted but nothing much was found 5/13; no major change in the condition or area of the wounds. I was able to get a fairly aggressive debridement done on the lateral left leg wound. Even using Sorbact under compression. She came back in on Friday to have the wrap changed. She says she felt uncomfortable on the [name], [name]. ([id number]) lateral aspect of her ankle. She has a long history of chronic venous insufficiency including previous ablation surgery on this side. 5/20-Patient returns for wounds on left leg with both wounds covered in slough, with the lateral leg wound larger in size, she has been in 3 layer compression and felt more comfortable, she describes pain in ankle, in leg and pins and needles in foot, and is about to try Pamelor for this 6/3; wounds on the left lateral and left medial leg. The area medially which is the most recent of the 2 seems to have had the largest increase in dimensions. We have been using Sorbac to try and debride the surface. She has been to see orthopedics they apparently did a plain x-ray that was indeterminant. Diagnosed her with neuropathy and they have ordered an MRI to determine if there is underlying osteomyelitis. This was not high on my thought list but I suppose it is prudent. We have advised her to make an appointment with vein and vascular in [place]. She has a history of a left greater saphenous and accessory vein ablations I wonder if there is anything else that can be done from a surgical point of view to help in these difficult refractory wounds. We have previously healed this wound on one occasion but it keeps on reopening [medial side] 6/10; deep tissue culture I did last week I think on the left medial wound showed  both moderate E. coli and moderate staph aureus [MSSA]. She is going to require antibiotics and I have  chosen Augmentin. We have been using Sorbact and we have made better looking wound surface on both sides but certainly no improvement in wound area. She was back in last Friday apparently for a dressing changes the wrap was hurting her outer left ankle. She has not managed to get a hold of vein and vascular in [place]. We are going to have to make her that appointment 6/17; patient is tolerating the Augmentin. She had an MRI that I think was ordered by orthopedic surgeon this did not show osteomyelitis or an abscess did suggest cellulitis. We have been using Sorbact to the lateral and medial ankles. We have been trying to arrange a follow-up appointment with vein and vascular in [place] or did her original ablations. We apparently an area sent the request to vein and vascular in [facility] 6/24; patient has completed the Augmentin. We do not yet have a vein and vascular appointment in [place]. I am not sure what the issue is here we have asked her to call tomorrow. We are using Sorbact. Making some improvements and especially the medial wound. Both surfaces however look better medial and lateral. 7/1; the patient has been in contact with vein and vascular in [place] but has not yet received an appointment. Using Sorbact we have gradually improve the wound surface with no improvement in surface area. She is approved for Apligraf but the wound surface still is not completely viable. She has not had to come in for a dressing change 7/8; the patient has an appointment with vein and vascular on 7/31 which is a Friday afternoon. She is concerned about getting back here for [place] to dress her wounds. I think it is important to have them goal for her venous reflux/history of ablations etc. to see if anything else can be done. She apparently tested positive for 1 of the blood tests with regards to lupus and saw a rheumatologist. He has raised the issue of vasculitis again. I have had this thought in  the past however the evidence seems overwhelming that this is a venous reflux etiology. If the rheumatologist tells me there is clinical and laboratory investigation is positive for lupus I will rethink this. 7/15; the patient's wound surfaces are quite a bit better. The medial area which was her original wound now has no depth although the lateral wound which was the more recent area actually appears larger. Both with viable surfaces which is indeed better. Using Sorbact. I wanted to use Apligraf on her however there is the issue of the vein and vascular appointment on 7/31 at 2:00 in the afternoon which would not allow her to get back to be rewrapped and they would no doubt remove the graft 7/22; the patient's wound surfaces have moderate amount of debris although generally look better. The lateral one is larger with 2 small satellite areas superiorly. We are waiting for her vein and vascular appointment on 7/31. She has been approved for Apligraf which I would like to use after th 7/29; wound surfaces have improved no debridement is required we have been using Sorbact. She sees vein and vascular on Friday with this so question of whether anything can be done to lessen the likelihood of recurrence and/or speed the healing of these areas. She is already had previous ablations. She no doubt has severe venous hypertension 8/5-Patient returns at 1 week, she was in [employer]  for 3 days by her podiatrist, we have been using so backed to the wound, she has increased pain in both the wounds on the left lower leg especially the more distal one on the lateral aspect 8/12-Patient returns at 1 week and she is agreeable to having debridement in both wounds on her left leg today. We have been using Sorbact, and vascular studies were reviewed at last visit 8/19; the patient arrives with her wounds fairly clean and no debridement is required. We have used Sorbact which is really done a nice job in cleaning up  these very difficult wound surfaces. The patient saw Dr. Donzetta Matters of vascular surgery on 7/31. He did not feel that there was an arterial component. He felt that her treated greater saphenous vein is adequately addressed and that the small saphenous vein did not appear to be involved significantly. She was also noted to have deep venous reflux which is not treatable. Dr. Donzetta Matters mentioned the possibility of a central obstructive component leading to reflux and he offered her central venography. She wanted to discuss this or think about it. I have urged her to go ahead with this. She has had recurrent difficult wounds in these areas which do heal but after months in the clinic. If there is anything that can be done to reduce the likelihood of this I think it is worth it. 9/2 she is still working towards getting follow-up with Dr. Donzetta Matters to schedule her CT. Things are quite a bit worse venography. I put Apligraf on 2 weeks ago on both wounds on the medial and lateral part of her left lower leg. She arrives in clinic today with 3 superficial additional wounds above the area laterally and one below the wound medially. She describes a lot of discomfort. I think these are probably wrapped injuries. Does not look like she has cellulitis. 07/20/2019 on evaluation today patient appears to be doing somewhat poorly in regard to her lower extremity ulcers. She in fact showed signs of erythema in fact we may even be dealing with an infection at this time. Unfortunately I am unsure if this is just infection or if indeed there may be some allergic reaction that occurred as a result of the Apligraf application. With that being said that would be unusual but nonetheless not impossible in this patient is one who is unfortunately allergic to quite a bit. Currently we have been using the Sorbact which seems to do as well as anything for her. I do think we may want to obtain a culture today to see if there is anything showing up  there that may need to be addressed. 9/16; noted that last week the wounds look worse in 1 week follow-up of the Apligraf. Using Sorbact as of 2 days ago. She arrives with copious amounts of drainage and new skin breakdown on the back of the left calf. The wounds arm more substantial bilaterally. There is a fair amount of swelling in the left calf no overt DVT there is edema present I think in the left greater than right thigh. She is supposed to go on 9/28 for CT venography. The wounds on the medial and lateral calf are worse and she has new skin breakdown posteriorly at least new for me. This is almost developing into a circumferential wound area The Apligraf was taken off last week which I agree with things are not going in the right direction a culture was done we do not have that back yet. She is  on Augmentin that she started 2 days ago 9/23; dressing was changed by her nurses on Monday. In general there is no improvement in the wound areas although the area looks less angry than last week. She did get Augmentin for MSSA cultured on the 14th. She still appears to have too much swelling in the left leg even with 3 layer compression 9/30; the patient underwent her procedure on 9/28 by Dr. Donzetta Matters at [facility]. She was discovered to have the common iliac vein measuring 12.2 mm but at the level of L4-L5 measured 3 mm. After stenting it measured 10 mm. 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 Rouleaux flow in the common femoral and femoral vein was observed much improved after stenting. We are using silver alginate to the wounds on the medial and lateral ankle on the left. 4 layer compression 10/7; the patient had fluid swelling around her knee and 4 layer compression. At the advice of vein and vascular this was reduced to 3 layer which she is tolerating better. We have been using silver alginate under 3 layer compression since last Friday 10/14; arrives with the  areas on the left ankle looking a lot better. Inflammation in the area also a lot better. She came in for a nurse check on [name], [name] ([id number]) 10/9 10/21; continued nice improvement. Slight improvements in surface area of both the medial and lateral wounds on the left. A lot of the satellite lesions in the weeping erythema around these from stasis dermatitis is resolved. We have been using silver alginate 10/28; general improvement in the entire wound areas although not a lot of change in dimensions the wound certainly looks better. There is a lot less in terms of venous inflammation. Continue silver alginate this week however look towards Hydrofera Blue next week 11/4; very adherent debris on the medial wound left wound is not as bad. We have been using silver alginate. Change to [facility] today 11/11; very adherent debris on both wound areas. She went to vein and vascular last week and follow-up they put in [place] boot on this today. He says the [facility] was adherent. Wound is definitely not as good as last week. Especially on the left there the satellite lesions look more prominent 11/18; absolutely no better. erythema on lateral aspect with tenderness. 09/30/2019 on evaluation today patient appears to actually be doing better. Dr. Dellia Nims did put her on doxycycline last week which I do believe has helped her at this point. Fortunately there is no signs of active infection at this time. No fevers, chills, nausea, vomiting, or diarrhea. I do believe he may want extend the doxycycline for 7 additional days just to ensure everything does completely cleared up the patient is in agreement with that plan. Otherwise she is going require some sharp debridement today 12/2; patient is completing a 2-week course of doxycycline. I gave her this empirically for inflammation as well as infection when I last saw her 2 weeks ago. All of this seems to be better. She is using silver alginate she  has the area on the medial aspect of the larger area laterally and the 2 small satellite regions laterally above the major wound. 12/9; the patient's wound on the left medial and left lateral calf look really quite good. We have been using silver alginate. She saw vein and vascular in follow-up on 10/09/2019. She has had a previous left greater saphenous vein ablation by Dr. Oscar La in 2016.  More recently she underwent a left common iliac vein stent by Dr. 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 The swelling is improved and certainly the wounds have improved. The patient shows [place] today area on the right medial calf there is almost no wound but leaking lymphedema. She says she start this started 3 or 4 days ago. She did not traumatize it. It is not painful. She does not wear compression on that side 12/16; the patient continues to do well laterally. Medially still requiring debridement. The area on the right calf did not materialize to anything and is not currently open. We wrapped this last time. She has support stockings for that leg although I am not sure they are going to provide adequate compression 12/23; the lateral wound looks stable. Medially still requiring debridement for tightly adherent fibrinous debris. We've been using silver alginate. Surface area not any different 12/30; neither wound is any better with regards to surface and the area on the left lateral is larger. I been using silver alginate to the left lateral which look quite good last week and Sorbact to the left medial 11/11/2019. Lateral wound area actually looks better and somewhat smaller. Medial still requires a very aggressive debridement today. We have been using Sorbact on both wound areas 1/13; not much better still adherent debris bilaterally. I been using Sorbact. She has severe venous hypertension. Probably some degree of dermal fibrosis distally. I wonder whether tighter compression might help and I am going  to try that today. We also need to work on the bioburden 1/20; using Sorbact. She has severe venous hypertension status post stent placement for pelvic vein compression. We applied gentamicin last time to see if we could reduce bioburden I had some discussion with her today about the use of pentoxifylline. This is occasionally used in this setting for wounds with refractory venous insufficiency. However this interacts with Plavix. She tells me that she was put on this after stent placement for 3 months. She will call Dr. Claretha Cooper office to discuss 1/27; we are using gentamicin under Sorbact. 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 She has a stent. Wound medially is measuring smaller this week. Laterally measuring slightly larger although she has some satellite lesions superiorly 2/3; gentamicin under Sorbact under 4-layer compression. 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 She has a stent on Plavix. Her wounds are measuring smaller this week. More substantially laterally where there is a satellite lesion superiorly. 2/10; gentamicin under Sorbac. 4-layer compression. Patient communicated with Dr. Donzetta Matters at vein and vascular in [place]. He is okay with the patient coming off Plavix I will therefore start her on pentoxifylline for a 1 month trial. In general her wounds look better today. I had some concerns about swelling in the left thigh however she measures 61.5 on the right and 63 on the mid thigh which does not suggest there is any difficulty. The patient is not describing any pain. 2/17; gentamicin under Sorbac 4-layer compression. She has been on pentoxifylline for 1 week and complains of loose stool. No nausea she is eating and drinking well 2/24; the patient apparently came in 2 days ago for a nurse visit when her wrap fell down. Both areas look a little worse this week macerated medially and satellite lesions laterally. Change to  silver alginate today 3/3; wounds are larger today especially medially. She also has more swelling in her foot lower leg and I even noted some swelling in  her posterior thigh which is tender. I wonder about the patency of her stent. Fortuitously she sees Dr. Claretha Cooper group on Friday 3/10; Mrs. Mastrogiovanni was seen by vein and vascular on 3/5. The patient underwent ultrasound. There was no evidence of thrombosis involving the IVC no evidence of thrombosis involving the right common iliac vein there is no evidence of thrombosis involving the right external iliac vein the left external vein is also patent. The right common iliac vein stent appears patent bilateral common femoral veins are compressible and appear patent. I was concerned about the left common iliac stent however it looks like this is functional. She has some edema in the posterior thigh that was tender she still has that this week. I also note they had trouble finding the pulses in her left foot and booked her for an ABI baseline in 4 weeks. She will follow up in 6 months for repeat IVC duplex. The patient stopped the pentoxifylline because of diarrhea. It does not look like that was being effective in any case. I have advised her to go back on her aspirin 81 mg tablet, vascular it also suggested this 3/17; comes in today with her wound surfaces a lot better. The excoriations from last week considerably better probably secondary to the TCA. We have been using silver alginate 3/24; comes in today with smaller wounds both medially and laterally. Both required debridement. There are 2 small satellite areas superiorly laterally. She also has a very odd bandlike area in the mid calf almost looking like there was a weakness in the wrap in a localized area. I would write this off as being this however anteriorly she has a small raised ballotable area that is very tender almost reminiscent of an abscess but there was no obvious purulent surface to  it. 02/04/20 upon evaluation today patient appears to be doing fairly well in regard to her wounds today. Fortunately there is no signs of active DEONE, OMAHONEY. ([id number]) infection at this time. No fevers, chills, nausea, vomiting, or diarrhea. She has been tolerating the dressing changes without complication. Fortunately I feel like she is showing signs of improvement although has been sometime since have seen her. Nonetheless the area of concern that Dr. Dellia Nims had last week where she had possibly an area of the wrap that was we can allow the leg to bulge appears to be doing significantly better today there is no signs of anything worsening. 4/7; the patient's wounds on her medial and lateral left leg continue to contract. We have been using a regular alginate. Last week she developed an area on the right medial lower leg which is probably a venous ulcer as well. 4/14; the wounds on her left medial and lateral lower leg continue to contract. Surface eschar. We have been using regular alginate. The area on the right medial lower leg is closed. We have been putting both legs under 4-layer contraction. The patient went back to see vein and vascular she had arterial studies done which were apparently "quite good" per the patient although I have not read their notes I have never felt she had an arterial issue. 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 This is been longstanding and refractory to exercise, leg elevation and longstanding use of compression wraps in our clinic as well as compression stockings on the times we have been able to get these to heal 4/21; we thought she actually might be close this week however she arrives in  clinic with a lot of edema in her upper left calf and into her posterior thigh. This is been an intermittent problem here. She says the wrap fell down but it was replaced with a nurse visit on Monday. We are using calcium  alginate to the wounds and the wound sizes there not terribly larger than last week but there is a lot more edema 4/28; again wound edges are smaller on both sides. Her edema is better controlled than last time. She is obtained her compression pumps from [employer] although they have not been to her home to set these up. 5/5; left medial and left lateral both look stable. I am not sure the medial is any smaller. We have been using calcium alginate under 4-layer compression. oShe had an area on the right medial. This was eschared today. We have been wrapping this as well. She does not tolerate external compression stockings due to a history of various contact allergies. She has her compression pumps however the representative from the company is coming on her to show her how to use these tomorrow 5/19; patient with severe chronic venous insufficiency secondary to central venous disease. She had a stent placed in her left common iliac vein. She has done better since but still difficult to control wounds. She comes in today with nothing open on the right leg. Her areas on the left medial and left lateral are just about closed. We are using calcium alginate under 4-layer compression. She is using her external compression pumps at home She only has 15-20 support stockings. States she cannot get anything tighter than that on. 03/30/20-Patient returns at 1 week, the wounds on the left leg are both slightly bigger, the last week she was on 3 layer compression which started to slide down. She is starting to use her lymphedema pumps although she stated on 1 day her right ankle started to swell up and she have to stop that day. Unfortunately the open area seem to oscillate between improving to the point of healing and then flaring up all to do with effectiveness of compression or lack of due to the left leg topography not keeping the compression wraps from rolling down 6/2 patient comes in with a 15/20  mmHg stocking on the right leg. She tells me that she developed a lot of swelling in her ankles she saw orthopedics she was felt to possibly be having a flare of pseudogout versus some other type of arthritis. She was put on steroids for a respiratory issue so that helps with the inflammation. She has not been using the pumps all week. She thinks the left thigh is more swollen than usual and I would agree with that. She has an appointment with Dr. Donzetta Matters 9 days or so from now 6/9; both wounds on the left medial and left lateral are smaller. We have been using calcium alginate under compression. She does not have an open wound on the right leg she is using a stocking and her compression pumps things are going well. She has an appointment with Dr. Donzetta Matters with regards to her stent in the left common iliac vein 6/16; the wounds on the left medial and left lateral ankle continues to contract. The patient saw Dr. Donzetta Matters and I think he seems satisfied. Ordered follow-up venous reflux studies on both sides in September. Cautioned that she may need thigh-high stockings. She has been using calcium alginate under compression on the left and her own stocking on the right leg.  She tells [place] there are no open wounds on the right 6/23; left lateral is just about closed. Medial required debridement today. We have been using calcium alginate. Extensive discussion about the compression pumps she is only using these on 25 mmHg states she could not take 40 or 30 when the wrap came out to her home to demonstrate these. He said they should not feel tight 6/30; the left lateral wound has a slight amount of eschar. . The area medially is about the same using Hydrofera Blue. 7/7; left lateral wound still has some eschar. I will remove this next week may be closed. The area medially is very small using Hydrofera Blue with improvement. Unfortunately the stockings fell down. Unfortunately the blisters have developed at the [name] of  where the wrap fell. When this happened she says her legs hurt she did not use her pumps. We are not open Monday for her to come in and change the wraps and she had an appointment yesterday. She also tells me that she is going to have an MRI of her back. She is having pain radiating into her left anterior leg she thinks her from an L5 disc. She saw Dr. Ellene Route of neurosurgery 7/14; the area on the left lateral ankle area is closed. Still a small area medially however it looks better as well. We have been using Hydrofera Blue under 4-layer compression 7/21; left lateral ankle is still closed however her wound on the medial left calf is actually larger. This is probably because Hydrofera Blue got stuck to the wound. She came in for a nurse change on Friday and will do that again this week I was concerned about the amount of swelling that she had last week however she is using her compression pumps twice a day and the swelling seems well controlled 7/28; remaining wound on the left medial lower leg is smaller. We have been using moistened silver collagen under compression she is coming back for a nurse visit. For reasons that were not really clear she was just keeping her legs elevated and not using her compression pumps. I have asked her to use the compression pumps. She does not have any wounds on the right leg 06/15/20-Patient returns at 2 weeks, her LLE edema is worse and she developed a blister wound that is new and has bigger posterior calf wound on right, we are using Prisma with pad, 4 layer compression. she has been on lasix 40 mg daily 8/18; patient arrives today with things a lot worse than I remember from a few weeks ago. She was seen last week. Noted that her edema was worse and that she now had a left lateral wound as well as deteriorating edema in the medial and posterior part of the lower leg. She says she is using his or her external compression pumps once a day although I wonder about  the compliance. 8/25; weeping area on the right medial lower leg. This had actually gotten a small localized area of her compression stocking wet. oOn the left side there is a large denuded area on the posterior medial lower leg and smaller area on the lateral. This was not the original areas that we dealt with. 9/1 the patient's wound on the left leg include the left lateral and left posterior. Larger superficial wounds weeping. She has very poor edema control. Tender localized edema in the left lower medial ankle/heel probably because of localized wrap issues. She freely admits she is not using the  compression pumps. She has been up on her feet a lot. She thinks the hydrofera blue is contributing to the pain she is experiencing.. This is a complaint that I have occasionally heard 9/8; really not much improvement. The patient is still complaining of a lot of pain particularly when she uses compression pumps. I switched her IVOREE, FELMLEE ([id number]) to silver alginate last time because she found the Hydrofera Blue to be irritating. I don't hear much difference in her description with the silver alginate. 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 She has follow-up with Dr. Donzetta Matters I think for ultrasound next month 9/15; quite a bit of improvement today. We have less edema and more epithelialization in both of her wound areas on the left medial and left lateral calf. These are not the site of her original wounds in this area. She says she has been using her compression pumps for 30 minutes twice a day, there pain issues that never quite understood. Silver alginate as the primary dressing 9/22; continued improvement. Both areas medially and laterally still have a small open area there is some eschar. She continues to complain of left medial ankle pain. Swelling in the leg is in much better condition. We have been using  silver alginate 9/29; continued improvement. Both areas medially and laterally in the left calf look as though they are close some minor surface eschar but I think this is epithelialized. She comes in today saying she has a ruptured disc at L4-L5 cannot bend over to put on her stockings. Electronic Signature(s) Signed: 08/03/2020 4:16:06 PM By: Linton Ham MD Entered By: Linton Ham on 08/03/2020 13:18:16 [name], [name] ([id number]) -------------------------------------------------------------------------------- Physical Exam Details Patient Name: [name], [name]. Date of Service: 08/03/2020 12:30 PM Medical Record Number: [id number] Patient Account Number: [id number] Date of Birth/Sex: 01/22/1946 (74 y.o. F) Treating RN: Cornell Barman Primary Care Provider: [name] Other Clinician: Referring Provider: [name] Treating Provider/Extender: Tito Dine in Treatment: 28 Constitutional Patient is hypertensive.. Pulse regular and within target range for patient.Marland Kitchen Respirations regular, non-labored and within target range.. Temperature is normal and within the target range for the patient.Marland Kitchen appears in no distress. Cardiovascular Pedal pulses on the left are palpable. Notes Wound exam; and lateral calf. In fact these are epithelialized. Some surface eschar however I do not think there is any open wound here. Her edema control is reasonably good in the distal part of her leg. She has minimal swelling in the left posterior thigh. She follows up with vein and vascular on 10/15 with regards to her central venous stent Electronic Signature(s) Signed: 08/03/2020 4:16:06 PM By: Linton Ham MD Entered By: Linton Ham on 08/03/2020 13:20:30 Cashman, [name] ([id number]) -------------------------------------------------------------------------------- Physician Orders Details Patient Name: KIMMBERLY, WISSER. Date of Service: 08/03/2020 12:30 PM Medical Record  Number: [id number] Patient Account Number: [id number] Date of Birth/Sex: 1946/02/16 (74 y.o. F) Treating RN: Army Melia Primary Care Provider: [name] Other Clinician: Referring Provider: [name] Treating Provider/Extender: Tito Dine in Treatment: 65 Verbal / Phone Orders: No Diagnosis Coding Wound Cleansing Wound #12 Left,Lateral Lower Leg o Dial antibacterial soap, wash wounds, rinse and pat dry prior to dressing wounds Wound #5 Left,Medial Lower Leg o Dial antibacterial soap, wash wounds, rinse and pat dry prior to dressing wounds Anesthetic (add to Medication List) Wound #12 Left,Lateral Lower Leg o Topical Lidocaine 4% cream applied  to wound bed prior to debridement (In Clinic Only). - if needed. Wound #5 Left,Medial Lower Leg o Topical Lidocaine 4% cream applied to wound bed prior to debridement (In Clinic Only). - if needed. Skin Barriers/Peri-Wound Care Wound #12 Left,Lateral Lower Leg o Triamcinolone Acetonide Ointment (TCA) Wound #5 Left,Medial Lower Leg o Triamcinolone Acetonide Ointment (TCA) Secondary Dressing Wound #12 Left,Lateral Lower Leg o ABD pad Wound #5 Left,Medial Lower Leg o ABD pad Dressing Change Frequency Wound #12 Left,Lateral Lower Leg o Change dressing every week o Other: - Nurse visit Friday and Monday if needed. Wound #5 Left,Medial Lower Leg o Change dressing every week o Other: - Nurse visit Friday and Monday if needed. Follow-up Appointments Wound #12 Left,Lateral Lower Leg o Return Appointment in 1 week. o Nurse Visit as needed Wound #5 Left,Medial Lower Leg o Return Appointment in 1 week. o Nurse Visit as needed Edema Control Wound #12 Left,Lateral Lower Leg o 4-Layer Compression System - Left Lower Extremity. o Patient to wear own compression stockings - Right leg o Compression Pump: Use compression pump on left lower extremity for 60 minutes, twice daily. - at  least 45 minutes o Compression Pump: Use compression pump on right lower extremity for 60 minutes, twice daily. - at least 45 minutes Wound #5 Left,Medial Lower Leg o 4-Layer Compression System - Left Lower Extremity. ARLIS, [name] ([id number]) o Patient to wear own compression stockings - Right leg o Compression Pump: Use compression pump on left lower extremity for 60 minutes, twice daily. - at least 45 minutes o Compression Pump: Use compression pump on right lower extremity for 60 minutes, twice daily. - at least 45 minutes Electronic Signature(s) Signed: 08/03/2020 1:12:22 PM By: Army Melia Signed: 08/03/2020 4:16:06 PM By: Linton Ham MD Entered By: Army Melia on 08/03/2020 13:12:20 CHYENNE, SOBCZAK ([id number]) -------------------------------------------------------------------------------- Problem List Details Patient Name: [name], [name]. Date of Service: 08/03/2020 12:30 PM Medical Record Number: [id number] Patient Account Number: [id number] Date of Birth/Sex: 25-Mar-1946 (74 y.o. F) Treating RN: Cornell Barman Primary Care Provider: [name] Other Clinician: Referring Provider: [name] Treating Provider/Extender: Tito Dine in Treatment: 86 Active Problems ICD-10 Encounter Code Description Active Date MDM Diagnosis L97.221 Non-pressure chronic ulcer of left calf limited to breakdown of skin 01/07/2019 No Yes I89.0 Lymphedema, not elsewhere classified 12/10/2018 No Yes I87.332 Chronic venous hypertension (idiopathic) with ulcer and inflammation of 12/09/2019 No Yes left lower extremity I83.009 Varicose veins of unspecified lower extremity with ulcer of unspecified 04/13/2020 No Yes site L97.818 Non-pressure chronic ulcer of other part of right lower leg with other 10/14/2019 No Yes specified severity I82.592 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 Date of Service: 08/03/2020 12:30 PM Medical Record Number: [id number] Patient Account Number: [id number] Date of Birth/Sex: 09-May-1946 (74 y.o. F) Treating RN: Cornell Barman Primary Care Provider: [name] Other Clinician: Referring Provider: [name] Treating Provider/Extender: Tito Dine in Treatment: 38 Subjective History of Present Illness (HPI) Pleasant 74 year old with history of chronic venous insufficiency. No diabetes or peripheral vascular disease. Left ABI 1.29. Questionable history of left lower extremity DVT. She developed  a recurrent ulceration on her left lateral calf in December 2015, which she attributes to poor diet and subsequent lower extremity edema. She underwent endovenous laser ablation of her left greater saphenous vein in 2010. She underwent laser ablation of accessory branch of left GSV in April 2016 by Dr. Kellie Simmering at [facility]. She was previously wearing Unna boots, which she tolerated well. Tolerating 2 layer compression and cadexomer iodine. She returns to clinic for follow-up and is without new complaints. She denies any significant pain at this time. She reports persistent pain with pressure. No claudication or ischemic rest pain. No fever or chills. No drainage. READMISSION 11/13/16; this is a 74 year old woman who is not a diabetic. She is here for a review of a painful area on her left medial lower extremity. I note that she was seen here previously last year for wound I believe to be in the  same area. At that time she had undergone previously a left greater saphenous vein ablation by Dr. Kellie Simmering and she had a ablation of the anterior accessory branch of the left greater saphenous vein in March 2016. Seeing that the wound actually closed over. In reviewing the history with her today the ulcer in this area has been recurrent. She describes a biopsy of this area in 2009 that only showed stasis physiology. 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 Do not see that she has ever had arterial studies in the left leg. She tells me she has a follow-up with Dr. Kellie Simmering in roughly 10 days In any case she developed the reopening of this area roughly a month ago. On the background of this she describes rapidly increasing edema which has responded to Lasix 40 mg and metolazone 2.5 mg as well as the patient's lymph massage. She has been told she has both venous insufficiency and lymphedema but she cannot tolerate compression stockings 11/28/16; the patient saw Dr. Kellie Simmering recently. Per the patient he did arterial Dopplers in the office that did not show evidence of arterial insufficiency, per the patient he stated "treat this like an ordinary venous ulcer". She also saw her dermatologist Dr. Ronnald Ramp who felt that this was more of a vascular ulcer. In general things are improving although she arrives today with increasing bilateral lower extremity edema with weeping a deeper fluid through the wound on the left medial leg compatible with some degree of lymphedema 12/04/16; the patient's wound is fully epithelialized but I don't think fully healed. We will do another week of depression with Promogran and TCA however I suspect we'll be able to discharge her next week. This is a very  unusual-looking wound which was initially a figure-of-eight type wound lying on its side surrounded by petechial like hemorrhage. She has had venous ablation on this side. She apparently does not have an arterial issue per Dr. Kellie Simmering. She saw her dermatologist thought it was "vascular". Patient is definitely going to need ongoing compression and I talked about this with her today she will go to elastic therapy after she leaves here next week 12/11/16; the patient's wound is not completely closed today. She has surrounding scar tissue and in further discussion with the patient it would appear that she had ulcers in this area in 2009 for a prolonged period  of time ultimately requiring a punch biopsy of this area that only showed venous insufficiency. I did not previously pickup on this part of the history from the patient. 12/18/16; the patient's wound is completely epithelialized. There is no open area here. She has significant bilateral venous insufficiency with secondary lymphedema to a mild-to-moderate degree she does not have compression stockings.. She did not say anything to me when I was in the room, she told our intake nurse that she was still having pain in this area. This isn't unusual recurrent small open area. She is going to go to elastic therapy to obtain compression stockings. 12/25/16; the patient's wound is fully epithelialized. There is no open area here. The patient describes some continued episodic discomfort in this area medial left calf. However everything looks fine and healed here. She is been to elastic therapy and caught herself 15-20 mmHg stockings, they apparently were having trouble getting 20-30 mm stockings in her size 01/22/17; this is a patient we discharged from the clinic a month ago. She has a recurrent open wound on her medial left calf. She had 15 mm support stockings. I told her I thought she needed 20-30 mm compression stockings. She tells me that she has been ill  with hospitalization secondary to asthma and is been found to have severe hypokalemia likely secondary to a combination of Lasix and metolazone. This morning she noted blistering and leaking fluid on the posterior part of her left leg. She called our intake nurse urgently and we was saw her this afternoon. She has not had any real discomfort here. I don't know that she's been wearing any stockings on this leg for at least 2-3 days. ABIs in this clinic were 1.21 on the right and 1.3 on the left. She is previously seen vascular surgery who does not think that there is a peripheral arterial issue. 01/30/17; Patient arrives with no open wound on the left leg. She has been to elastic therapy and obtained 20-7mmhg below knee stockings and she has one on the right leg today. READMISSION 02/19/18; this Dlugosz is a now 74 year old patient we've had in this clinic perhaps 3 times before. I had last looked at her from January 07 December 2016 with an area on the medial left leg. We discharged her on 12/25/16 however she had to be readmitted on 01/22/17 with a recurrence. I have in my notes that we discharged her on 20-30 mm stockings although she tells me she was only wearing support hose because she cannot get stockings on predominantly related to her cervical spine surgery/issues. She has had previous ablations done by vein and vascular in [place] including a great saphenous vein ablation on the left with an anterior accessory branch ablation I think both of these were in 2016. On one of the previous visit she had a biopsy noted 2009 that was negative. She is not felt to have an arterial issue. She is not a diabetic. She does have a history of obstructive sleep apnea hypertension asthma as well as chronic venous insufficiency and lymphedema. On this occasion she noted 2 dry scaly patch on her left leg. She tried to put lotion on this it didn't really help. There were 2 open areas.the patient has been seeing  her primary physician from 02/05/18 through 02/14/18. She had Unna boots applied. The superior wound now on the lateral left leg has closed but she's had one wound that remains open on the lateral left leg. This is not the same  spot as we dealt with in 2018. ABIs in this clinic were 1.3 bilaterally [name], [name] ([id number]) 02/26/18; patient has a small wound on the left lateral calf. Dimensions are down. She has chronic venous insufficiency and lymphedema. 03/05/18; small open area on the left lateral calf. Dimensions are down. Tightly adherent necrotic debris over the surface of the wound which was difficult to remove. Also the dressing [over collagen] stuck to the wound surface. This was removed with some difficulty as well. Change the primary dressing to Hydrofera Blue ready 03/12/18; small open area on the left lateral calf. Comes in with tightly adherent surface eschar as well as some adherent Hydrofera Blue. 03/19/18; open area on the left lateral calf. Again adherent surface eschar as well as some adherent Hydrofera Blue nonviable subcutaneous tissue. She complained of pain all week even with the reduction from 4-3 layer compression I put on last week. Also she had an increase in her ankle and calf measurements probably related to the same thing. 03/26/18; open area on the left lateral calf. A very small open area remains here. We used silver alginate starting last week as the Hydrofera Blue seem to stick to the wound bed. In using 4-layer compression 04/02/18; the open area in the left lateral calf at some adherent slough which I removed there is no open area here. We are able to transition her into her own compression stocking. Truthfully I think this is probably his support hose. However this does not maintain skin integrity will be limited. She cannot put over the toe compression stockings on because of neck problems hand problems etc. She is allergic to the lining layer of juxta lites. We might  be forced to use extremitease stocking should this fail READMIT 11/24/2018 Patient is now a 74 year old woman who is not a diabetic. She has been in this clinic on at least 3 previous occasions largely with recurrent wounds on her left leg secondary to chronic venous insufficiency with secondary lymphedema. Her situation is complicated by inability to get stockings on and an allergy to neoprene which is apparently a component and at least juxta lites and other stockings. As a result she really has not been wearing any stockings on her legs. She tells [place] that roughly 2 or 3 weeks ago she started noticing a stinging sensation just above her ankle on the left medial aspect. She has been diagnosed with pseudogout and she wondered whether this was what she was experiencing. She tried to dress this with something she bought at the store however subsequently it pulled skin off and now she has an open wound that is not improving. She has been using Vaseline gauze with a cover bandage. She saw her primary doctor last week who put an Haematologist on her. ABIs in this clinic was 1.03 on the left 2/12; the area is on the left medial ankle. Odd-looking wound with what looks to be surface epithelialization but a multitude of small petechial openings. This clearly not closed yet. We have been using silver alginate under 3 layer compression with TCA 2/19; the wound area did not look quite as good this week. Necrotic debris over the majority of the wound surface which required debridement. She continues to have a multitude of what looked to be small petechial openings. She reminds [place] that she had a biopsy on this initially during her first outbreak in 2015 in [place] dermatology. She expresses concern about this being a possible melanoma. She apparently had a nodular  melanoma up on her shoulder that was treated with excision, lymph node removal and ultimately radiation. I assured her that this does not look anything  like melanoma. Except for the petechial reaction it does look like a venous insufficiency area and she certainly has evidence of this on both sides 2/26; a difficult area on the left medial ankle. The patient clearly has chronic venous hypertension with some degree of lymphedema. The odd thing about the area is the small petechial hemorrhages. I am not really sure how to explain this. This was present last time and this is not a compression injury. We have been using Hydrofera Blue which I changed to last week 3/4; still using Hydrofera Blue. Aggressive debridement today. She does not have known arterial issues. She has seen Dr. Kellie Simmering at [facility] vein and vascular and and has an ablation on the left. [Anterior accessory branch of the greater saphenous]. From what I remember they did not feel she had an arterial issue. The patient has had this area biopsied in 2009 at [facility] dermatology and by her recollection they said this was "stasis". She is also follow-up with dermatology locally who thought that this was more of a vascular issue 3/11; using Hydrofera Blue. Aggressive debridement today. She does not have an arterial issue. We are using 3 layer compression although we may need to go to 4. The patient has been in for multiple changes to her wrap since I last saw her a week ago. She says that the area was leaking. I do not have too much more information on what was found 01/19/19 on evaluation today patient was actually being seen for a nurse visit when unfortunately she had the area on her left lateral lower extremity as well as weeping from the right lower extremity that became apparent. Therefore we did end up actually seeing her for a full visit with myself. She is having some pain at this site as well but fortunately nothing too significant at this point. No fevers, chills, nausea, or vomiting noted at this time. 3/18-Patient is back to the clinic with the left leg venous leg ulcer, the  ulcer is larger in size, has a surface that is densely adherent with fibrinous tissue, the Hydrofera Blue was used but is densely adherent and there was difficulty in removing it. The right lower extremity was also wrapped for weeping edema. Patient has a new area over the left lateral foot above the malleolus that is small and appears to have no debris with intact surrounding skin. Patient is on increased dose of Lasix also as a means to edema management 3/25; the patient has a nonhealing venous ulcer on the medial left leg and last week developed a smaller area on the lateral left calf. We have been using Hydrofera Blue with a contact layer. 4/1; no major change in these wounds areas. Left medial and more recently left lateral calf. I tried Iodoflex last week to aid in debridement she did not tolerate this. She stated her pain was terrible all week. She took the top layer of the 4 layer compression off. 4/8; the patient actually looks somewhat better in terms of her more prominent left lateral calf wound. There is some healthy looking tissue here. She is still complaining of a lot of discomfort. 4/15; patient in a lot of pain secondary to sciatica. She is on a prednisone taper prescribed by her primary physician. She has the 2 areas one on the left medial and more recently a  smaller area on the left lateral calf. Both of these just above the malleoli 4/22; her back pain is better but she still states she is very uncomfortable and now feels she is intolerant to the [employer]. No real change in the wounds we have been using Sorbact. She has been previously intolerant to Iodoflex. There is not a lot of option about what we can use to debride this wound under compression that she no doubt needs. sHe states Ultram no longer works for her pain 4/29; no major change in the wounds slightly increased depth. Surface on the original medial wound perhaps somewhat improved however the more recent area on the  lateral left ankle is 100% covered in very adherent debris we have been using Sorbact. She tolerates 4 layer compression well and her edema control is a lot better. She has not had to come in for a nurse check 5/6; no major change in the condition of the wounds. She did consent to debridement today which was done with some difficulty. Continuing Sorbact. She did not tolerate Iodoflex. She was in for a check of her compression the day after we wrapped her last week this was adjusted but nothing much was found 5/13; no major change in the condition or area of the wounds. I was able to get a fairly aggressive debridement done on the lateral left leg wound. Even using Sorbact under compression. She came back in on Friday to have the wrap changed. She says she felt uncomfortable on the lateral aspect of her ankle. She has a long history of chronic venous insufficiency including previous ablation surgery on this side. 5/20-Patient returns for wounds on left leg with both wounds covered in slough, with the lateral leg wound larger in size, she has been in 3 layer compression and felt more comfortable, she describes pain in ankle, in leg and pins and needles in foot, and is about to try Pamelor for this 6/3; wounds on the left lateral and left medial leg. The area medially which is the most recent of the 2 seems to have had the largest increase in [place], SHANDIE BERTZ. ([id number]) dimensions. We have been using Sorbac to try and debride the surface. She has been to see orthopedics they apparently did a plain x-ray that was indeterminant. Diagnosed her with neuropathy and they have ordered an MRI to determine if there is underlying osteomyelitis. This was not high on my thought list but I suppose it is prudent. We have advised her to make an appointment with vein and vascular in [place]. She has a history of a left greater saphenous and accessory vein ablations I wonder if there is anything else that can be done  from a surgical point of view to help in these difficult refractory wounds. We have previously healed this wound on one occasion but it keeps on reopening [medial side] 6/10; deep tissue culture I did last week I think on the left medial wound showed both moderate E. coli and moderate staph aureus [MSSA]. She is going to require antibiotics and I have chosen Augmentin. We have been using Sorbact and we have made better looking wound surface on both sides but certainly no improvement in wound area. She was back in last Friday apparently for a dressing changes the wrap was hurting her outer left ankle. She has not managed to get a hold of vein and vascular in [place]. We are going to have to make her that appointment 6/17; patient is tolerating the  Augmentin. She had an MRI that I think was ordered by orthopedic surgeon this did not show osteomyelitis or an abscess did suggest cellulitis. We have been using Sorbact to the lateral and medial ankles. We have been trying to arrange a follow-up appointment with vein and vascular in [place] or did her original ablations. We apparently an area sent the request to vein and vascular in [facility] 6/24; patient has completed the Augmentin. We do not yet have a vein and vascular appointment in [place]. I am not sure what the issue is here we have asked her to call tomorrow. We are using Sorbact. Making some improvements and especially the medial wound. Both surfaces however look better medial and lateral. 7/1; the patient has been in contact with vein and vascular in [place] but has not yet received an appointment. Using Sorbact we have gradually improve the wound surface with no improvement in surface area. She is approved for Apligraf but the wound surface still is not completely viable. She has not had to come in for a dressing change 7/8; the patient has an appointment with vein and vascular on 7/31 which is a Friday afternoon. She is concerned  about getting back here for [place] to dress her wounds. I think it is important to have them goal for her venous reflux/history of ablations etc. to see if anything else can be done. She apparently tested positive for 1 of the blood tests with regards to lupus and saw a rheumatologist. He has raised the issue of vasculitis again. I have had this thought in the past however the evidence seems overwhelming that this is a venous reflux etiology. If the rheumatologist tells me there is clinical and laboratory investigation is positive for lupus I will rethink this. 7/15; the patient's wound surfaces are quite a bit better. The medial area which was her original wound now has no depth although the lateral wound which was the more recent area actually appears larger. Both with viable surfaces which is indeed better. Using Sorbact. I wanted to use Apligraf on her however there is the issue of the vein and vascular appointment on 7/31 at 2:00 in the afternoon which would not allow her to get back to be rewrapped and they would no doubt remove the graft 7/22; the patient's wound surfaces have moderate amount of debris although generally look better. The lateral one is larger with 2 small satellite areas superiorly. We are waiting for her vein and vascular appointment on 7/31. She has been approved for Apligraf which I would like to use after th 7/29; wound surfaces have improved no debridement is required we have been using Sorbact. She sees vein and vascular on Friday with this so question of whether anything can be done to lessen the likelihood of recurrence and/or speed the healing of these areas. She is already had previous ablations. She no doubt has severe venous hypertension 8/5-Patient returns at 1 week, she was in [place] for 3 days by her podiatrist, we have been using so backed to the wound, she has increased pain in both the wounds on the left lower leg especially the more distal one on the lateral  aspect 8/12-Patient returns at 1 week and she is agreeable to having debridement in both wounds on her left leg today. We have been using Sorbact, and vascular studies were reviewed at last visit 8/19; the patient arrives with her wounds fairly clean and no debridement is required. We have used Sorbact which is really  done a nice job in cleaning up these very difficult wound surfaces. The patient saw Dr. Donzetta Matters of vascular surgery on 7/31. He did not feel that there was an arterial component. He felt that her treated greater saphenous vein is adequately addressed and that the small saphenous vein did not appear to be involved significantly. She was also noted to have deep venous reflux which is not treatable. Dr. Donzetta Matters mentioned the possibility of a central obstructive component leading to reflux and he offered her central venography. She wanted to discuss this or think about it. I have urged her to go ahead with this. She has had recurrent difficult wounds in these areas which do heal but after months in the clinic. If there is anything that can be done to reduce the likelihood of this I think it is worth it. 9/2 she is still working towards getting follow-up with Dr. Donzetta Matters to schedule her CT. Things are quite a bit worse venography. I put Apligraf on 2 weeks ago on both wounds on the medial and lateral part of her left lower leg. She arrives in clinic today with 3 superficial additional wounds above the area laterally and one below the wound medially. She describes a lot of discomfort. I think these are probably wrapped injuries. Does not look like she has cellulitis. 07/20/2019 on evaluation today patient appears to be doing somewhat poorly in regard to her lower extremity ulcers. She in fact showed signs of erythema in fact we may even be dealing with an infection at this time. Unfortunately I am unsure if this is just infection or if indeed there may be some allergic reaction that occurred as a  result of the Apligraf application. With that being said that would be unusual but nonetheless not impossible in this patient is one who is unfortunately allergic to quite a bit. Currently we have been using the Sorbact which seems to do as well as anything for her. I do think we may want to obtain a culture today to see if there is anything showing up there that may need to be addressed. 9/16; noted that last week the wounds look worse in 1 week follow-up of the Apligraf. Using Sorbact as of 2 days ago. She arrives with copious amounts of drainage and new skin breakdown on the back of the left calf. The wounds arm more substantial bilaterally. There is a fair amount of swelling in the left calf no overt DVT there is edema present I think in the left greater than right thigh. She is supposed to go on 9/28 for CT venography. The wounds on the medial and lateral calf are worse and she has new skin breakdown posteriorly at least new for me. This is almost developing into a circumferential wound area The Apligraf was taken off last week which I agree with things are not going in the right direction a culture was done we do not have that back yet. She is on Augmentin that she started 2 days ago 9/23; dressing was changed by her nurses on Monday. In general there is no improvement in the wound areas although the area looks less angry than last week. She did get Augmentin for MSSA cultured on the 14th. She still appears to have too much swelling in the left leg even with 3 layer compression 9/30; the patient underwent her procedure on 9/28 by Dr. Donzetta [facility]ters at [facility]. She was discovered to have the common iliac vein measuring 12.2 mm but  at the level of L4-L5 measured 3 mm. After stenting it measured 10 mm. 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 Rouleaux flow in the common femoral and femoral vein was observed much improved after stenting. We are using silver alginate to  the wounds on the medial and lateral ankle on the left. 4 layer compression 10/7; the patient had fluid swelling around her knee and 4 layer compression. At the advice of vein and vascular this was reduced to 3 layer which she is tolerating better. We have been using silver alginate under 3 layer compression since last Friday 10/14; arrives with the areas on the left ankle looking a lot better. Inflammation in the area also a lot better. She came in for a nurse check on 10/9 10/21; continued nice improvement. Slight improvements in surface area of both the medial and lateral wounds on the left. A lot of the satellite lesions in the weeping erythema around these from stasis dermatitis is resolved. We have been using silver alginate [name], [name] ([id number]) 10/28; general improvement in the entire wound areas although not a lot of change in dimensions the wound certainly looks better. There is a lot less in terms of venous inflammation. Continue silver alginate this week however look towards Hydrofera Blue next week 11/4; very adherent debris on the medial wound left wound is not as bad. We have been using silver alginate. Change to [facility] today 11/11; very adherent debris on both wound areas. She went to vein and vascular last week and follow-up they put in [place] boot on this today. He says the [facility] was adherent. Wound is definitely not as good as last week. Especially on the left there the satellite lesions look more prominent 11/18; absolutely no better. erythema on lateral aspect with tenderness. 09/30/2019 on evaluation today patient appears to actually be doing better. Dr. Dellia Nims did put her on doxycycline last week which I do believe has helped her at this point. Fortunately there is no signs of active infection at this time. No fevers, chills, nausea, vomiting, or diarrhea. I do believe he may want extend the doxycycline for 7 additional days just to ensure everything  does completely cleared up the patient is in agreement with that plan. Otherwise she is going require some sharp debridement today 12/2; patient is completing a 2-week course of doxycycline. I gave her this empirically for inflammation as well as infection when I last saw her 2 weeks ago. All of this seems to be better. She is using silver alginate she has the area on the medial aspect of the larger area laterally and the 2 small satellite regions laterally above the major wound. 12/9; the patient's wound on the left medial and left lateral calf look really quite good. We have been using silver alginate. She saw vein and vascular in follow-up on 10/09/2019. She has had a previous left greater saphenous vein ablation by Dr. Oscar La in 2016. More recently she underwent a left common iliac vein stent by Dr. 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 The swelling is improved and certainly the wounds have improved. The patient shows [place] today area on the right medial calf there is almost no wound but leaking lymphedema. She says she start this started 3 or 4 days ago. She did not traumatize it. It is not painful. She does not wear compression on that side 12/16; the patient continues to do well laterally. Medially still requiring debridement. The area  on the right calf did not materialize to anything and is not currently open. We wrapped this last time. She has support stockings for that leg although I am not sure they are going to provide adequate compression 12/23; the lateral wound looks stable. Medially still requiring debridement for tightly adherent fibrinous debris. We've been using silver alginate. Surface area not any different 12/30; neither wound is any better with regards to surface and the area on the left lateral is larger. I been using silver alginate to the left lateral which look quite good last week and Sorbact to the left medial 11/11/2019. Lateral wound area actually looks better  and somewhat smaller. Medial still requires a very aggressive debridement today. We have been using Sorbact on both wound areas 1/13; not much better still adherent debris bilaterally. I been using Sorbact. She has severe venous hypertension. Probably some degree of dermal fibrosis distally. I wonder whether tighter compression might help and I am going to try that today. We also need to work on the bioburden 1/20; using Sorbact. She has severe venous hypertension status post stent placement for pelvic vein compression. We applied gentamicin last time to see if we could reduce bioburden I had some discussion with her today about the use of pentoxifylline. This is occasionally used in this setting for wounds with refractory venous insufficiency. However this interacts with Plavix. She tells me that she was put on this after stent placement for 3 months. She will call Dr. Claretha Cooper office to discuss 1/27; we are using gentamicin under Sorbact. 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 She has a stent. Wound medially is measuring smaller this week. Laterally measuring slightly larger although she has some satellite lesions superiorly 2/3; gentamicin under Sorbact under 4-layer compression. 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 She has a stent on Plavix. Her wounds are measuring smaller this week. More substantially laterally where there is a satellite lesion superiorly. 2/10; gentamicin under Sorbac. 4-layer compression. Patient communicated with Dr. Donzetta Matters at vein and vascular in [place]. He is okay with the patient coming off Plavix I will therefore start her on pentoxifylline for a 1 month trial. In general her wounds look better today. I had some concerns about swelling in the left thigh however she measures 61.5 on the right and 63 on the mid thigh which does not suggest there is any difficulty. The patient is not describing any pain. 2/17;  gentamicin under Sorbac 4-layer compression. She has been on pentoxifylline for 1 week and complains of loose stool. No nausea she is eating and drinking well 2/24; the patient apparently came in 2 days ago for a nurse visit when her wrap fell down. Both areas look a little worse this week macerated medially and satellite lesions laterally. Change to silver alginate today 3/3; wounds are larger today especially medially. She also has more swelling in her foot lower leg and I even noted some swelling in her posterior thigh which is tender. I wonder about the patency of her stent. Fortuitously she sees Dr. Claretha Cooper group on Friday 3/10; Mrs. Muller was seen by vein and vascular on 3/5. The patient underwent ultrasound. There was no evidence of thrombosis involving the IVC no evidence of thrombosis involving the right common iliac vein there is no evidence of thrombosis involving the right external iliac vein the left external vein is also patent. The right common iliac vein stent appears patent bilateral common femoral veins are compressible and appear patent.  I was concerned about the left common iliac stent however it looks like this is functional. She has some edema in the posterior thigh that was tender she still has that this week. I also note they had trouble finding the pulses in her left foot and booked her for an ABI baseline in 4 weeks. She will follow up in 6 months for repeat IVC duplex. The patient stopped the pentoxifylline because of diarrhea. It does not look like that was being effective in any case. I have advised her to go back on her aspirin 81 mg tablet, vascular it also suggested this 3/17; comes in today with her wound surfaces a lot better. The excoriations from last week considerably better probably secondary to the TCA. We have been using silver alginate 3/24; comes in today with smaller wounds both medially and laterally. Both required debridement. There are 2 small satellite  areas superiorly laterally. She also has a very odd bandlike area in the mid calf almost looking like there was a weakness in the wrap in a localized area. I would write this off as being this however anteriorly she has a small raised ballotable area that is very tender almost reminiscent of an abscess but there was no obvious purulent surface to it. 02/04/20 upon evaluation today patient appears to be doing fairly well in regard to her wounds today. Fortunately there is no signs of active infection at this time. No fevers, chills, nausea, vomiting, or diarrhea. She has been tolerating the dressing changes without complication. Fortunately I feel like she is showing signs of improvement although has been sometime since have seen her. Nonetheless the area of concern that Dr. Dellia Nims had last week where she had possibly an area of the wrap that was we can allow the leg to bulge appears to be doing significantly better today there is no signs of anything worsening. [name], [name] ([id number]) 4/7; the patient's wounds on her medial and lateral left leg continue to contract. We have been using a regular alginate. Last week she developed an area on the right medial lower leg which is probably a venous ulcer as well. 4/14; the wounds on her left medial and lateral lower leg continue to contract. Surface eschar. We have been using regular alginate. The area on the right medial lower leg is closed. We have been putting both legs under 4-layer contraction. The patient went back to see vein and vascular she had arterial studies done which were apparently "quite good" per the patient although I have not read their notes I have never felt she had an arterial issue. 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 This is been longstanding and refractory to exercise, leg elevation and longstanding use of compression wraps in our clinic as well as compression stockings on the  times we have been able to get these to heal 4/21; we thought she actually might be close this week however she arrives in clinic with a lot of edema in her upper left calf and into her posterior thigh. This is been an intermittent problem here. She says the wrap fell down but it was replaced with a nurse visit on Monday. We are using calcium alginate to the wounds and the wound sizes there not terribly larger than last week but there is a lot more edema 4/28; again wound edges are smaller on both sides. Her edema is better controlled than last time. She is obtained her compression pumps from [employer]  although they have not been to her home to set these up. 5/5; left medial and left lateral both look stable. I am not sure the medial is any smaller. We have been using calcium alginate under 4-layer compression. She had an area on the right medial. This was eschared today. We have been wrapping this as well. She does not tolerate external compression stockings due to a history of various contact allergies. She has her compression pumps however the representative from the company is coming on her to show her how to use these tomorrow 5/19; patient with severe chronic venous insufficiency secondary to central venous disease. She had a stent placed in her left common iliac vein. She has done better since but still difficult to control wounds. She comes in today with nothing open on the right leg. Her areas on the left medial and left lateral are just about closed. We are using calcium alginate under 4-layer compression. She is using her external compression pumps at home She only has 15-20 support stockings. States she cannot get anything tighter than that on. 03/30/20-Patient returns at 1 week, the wounds on the left leg are both slightly bigger, the last week she was on 3 layer compression which started to slide down. She is starting to use her lymphedema pumps although she stated on 1 day her  right ankle started to swell up and she have to stop that day. Unfortunately the open area seem to oscillate between improving to the point of healing and then flaring up all to do with effectiveness of compression or lack of due to the left leg topography not keeping the compression wraps from rolling down 6/2 patient comes in with a 15/20 mmHg stocking on the right leg. She tells me that she developed a lot of swelling in her ankles she saw orthopedics she was felt to possibly be having a flare of pseudogout versus some other type of arthritis. She was put on steroids for a respiratory issue so that helps with the inflammation. She has not been using the pumps all week. She thinks the left thigh is more swollen than usual and I would agree with that. She has an appointment with Dr. Donzetta Matters 9 days or so from now 6/9; both wounds on the left medial and left lateral are smaller. We have been using calcium alginate under compression. She does not have an open wound on the right leg she is using a stocking and her compression pumps things are going well. She has an appointment with Dr. Donzetta Matters with regards to her stent in the left common iliac vein 6/16; the wounds on the left medial and left lateral ankle continues to contract. The patient saw Dr. Donzetta Matters and I think he seems satisfied. Ordered follow-up venous reflux studies on both sides in September. Cautioned that she may need thigh-high stockings. She has been using calcium alginate under compression on the left and her own stocking on the right leg. She tells [place] there are no open wounds on the right 6/23; left lateral is just about closed. Medial required debridement today. We have been using calcium alginate. Extensive discussion about the compression pumps she is only using these on 25 mmHg states she could not take 40 or 30 when the wrap came out to her home to demonstrate these. He said they should not feel tight 6/30; the left lateral wound has a  slight amount of eschar. . The area medially is about the same using Hydrofera Blue. 7/7;  left lateral wound still has some eschar. I will remove this next week may be closed. The area medially is very small using Hydrofera Blue with improvement. Unfortunately the stockings fell down. Unfortunately the blisters have developed at the [name] of where the wrap fell. When this happened she says her legs hurt she did not use her pumps. We are not open Monday for her to come in and change the wraps and she had an appointment yesterday. She also tells me that she is going to have an MRI of her back. She is having pain radiating into her left anterior leg she thinks her from an L5 disc. She saw Dr. Ellene Route of neurosurgery 7/14; the area on the left lateral ankle area is closed. Still a small area medially however it looks better as well. We have been using Hydrofera Blue under 4-layer compression 7/21; left lateral ankle is still closed however her wound on the medial left calf is actually larger. This is probably because Hydrofera Blue got stuck to the wound. She came in for a nurse change on Friday and will do that again this week I was concerned about the amount of swelling that she had last week however she is using her compression pumps twice a day and the swelling seems well controlled 7/28; remaining wound on the left medial lower leg is smaller. We have been using moistened silver collagen under compression she is coming back for a nurse visit. For reasons that were not really clear she was just keeping her legs elevated and not using her compression pumps. I have asked her to use the compression pumps. She does not have any wounds on the right leg 06/15/20-Patient returns at 2 weeks, her LLE edema is worse and she developed a blister wound that is new and has bigger posterior calf wound on right, we are using Prisma with pad, 4 layer compression. she has been on lasix 40 mg daily 8/18; patient arrives  today with things a lot worse than I remember from a few weeks ago. She was seen last week. Noted that her edema was worse and that she now had a left lateral wound as well as deteriorating edema in the medial and posterior part of the lower leg. She says she is using his or her external compression pumps once a day although I wonder about the compliance. 8/25; weeping area on the right medial lower leg. This had actually gotten a small localized area of her compression stocking wet. On the left side there is a large denuded area on the posterior medial lower leg and smaller area on the lateral. This was not the original areas that we dealt with. 9/1 the patient's wound on the left leg include the left lateral and left posterior. Larger superficial wounds weeping. She has very poor edema control. Tender localized edema in the left lower medial ankle/heel probably because of localized wrap issues. She freely admits she is not using the compression pumps. She has been up on her feet a lot. She thinks the hydrofera blue is contributing to the pain she is experiencing.. This is a complaint that I have occasionally heard 9/8; really not much improvement. The patient is still complaining of a lot of pain particularly when she uses compression pumps. I switched her to silver alginate last time because she found the Hydrofera Blue to be irritating. I don't hear much difference in her description with the silver alginate. 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 She has follow-up with Dr. Donzetta Matters I think for ultrasound next month [name], [name] ([id number]) 9/15; quite a bit of improvement today. We have less edema and more epithelialization in both of her wound areas on the left medial and left lateral calf. These are not the site of her original wounds in this area. She says she has been using her compression pumps for 30 minutes  twice a day, there pain issues that never quite understood. Silver alginate as the primary dressing 9/22; continued improvement. Both areas medially and laterally still have a small open area there is some eschar. She continues to complain of left medial ankle pain. Swelling in the leg is in much better condition. We have been using silver alginate 9/29; continued improvement. Both areas medially and laterally in the left calf look as though they are close some minor surface eschar but I think this is epithelialized. She comes in today saying she has a ruptured disc at L4-L5 cannot bend over to put on her stockings. Objective Constitutional Patient is hypertensive.. Pulse regular and within target range for patient.Marland Kitchen Respirations regular, non-labored and within target range.. Temperature is normal and within the target range for the patient.Marland Kitchen appears in no distress. Vitals Time Taken: 12:40 PM, Height: 63 in, Weight: 224.7 lbs, BMI: 39.8, Temperature: 98.2 F, Pulse: 86 bpm, Respiratory Rate: 18 breaths/min, Blood Pressure: 161/85 mmHg. Cardiovascular Pedal pulses on the left are palpable. General Notes: Wound exam; and lateral calf. In fact these are epithelialized. Some surface eschar however I do not think there is any open wound here. Her edema control is reasonably good in the distal part of her leg. She has minimal swelling in the left posterior thigh. She follows up with vein and vascular on 10/15 with regards to her central venous stent Integumentary (Hair, Skin) Wound #12 status is Open. Original cause of wound was Gradually Appeared. The wound is located on the Left,Lateral Lower Leg. The wound measures 0.1cm length x 0.1cm width x 0cm depth; 0.008cm^2 area and 0.001cm^3 volume. The wound is limited to skin breakdown. There is a small amount of serous drainage noted. The wound margin is indistinct and nonvisible. There is no granulation within the wound bed. There is a small (1-33%)  amount of necrotic tissue within the wound bed including Eschar. Wound #5 status is Open. Original cause of wound was Gradually Appeared. The wound is located on the Left,Medial Lower Leg. The wound measures 0.1cm length x 0.1cm width x 0cm depth; 0.008cm^2 area and 0.001cm^3 volume. The wound is limited to skin breakdown. There is a small amount of serous drainage noted. The wound margin is indistinct and nonvisible. There is no granulation within the wound bed. There is no necrotic tissue within the wound bed. 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 There was a Three Layer Compression Therapy Procedure by Army Melia, RN. Post procedure Diagnosis Wound #12: Same as Pre-Procedure Wound #5 Pre-procedure diagnosis  of Wound #5 is a Lymphedema located on the Left,Medial Lower Leg . There was a Three Layer Compression Therapy CARMEL, GARFIELD ([id number]) Procedure by Army Melia, RN. Post procedure Diagnosis Wound #5: Same as Pre-Procedure Plan Wound Cleansing: Wound #12 Left,Lateral Lower Leg: Dial antibacterial soap, wash wounds, rinse and pat dry prior to dressing wounds Wound #5 Left,Medial Lower Leg: Dial antibacterial soap, wash wounds, rinse and pat dry prior to dressing wounds Anesthetic (add to Medication List): Wound #12 Left,Lateral Lower Leg: Topical Lidocaine 4% cream applied to wound bed prior to debridement (In Clinic  Only). - if needed. Wound #5 Left,Medial Lower Leg: Topical Lidocaine 4% cream applied to wound bed prior to debridement (In Clinic Only). - if needed. Skin Barriers/Peri-Wound Care: Wound #12 Left,Lateral Lower Leg: Triamcinolone Acetonide Ointment (TCA) Wound #5 Left,Medial Lower Leg: Triamcinolone Acetonide Ointment (TCA) Secondary Dressing: Wound #12 Left,Lateral Lower Leg: ABD pad Wound #5 Left,Medial Lower Leg: ABD pad Dressing Change Frequency: Wound #12 Left,Lateral Lower Leg: Change dressing every week Other: - Nurse visit Friday and Monday if needed. Wound #5 Left,Medial Lower Leg: Change dressing every week Other: - Nurse visit Friday and Monday if needed. Follow-up Appointments: Wound #12 Left,Lateral Lower Leg: Return Appointment in 1 week. Nurse Visit as needed Wound #5 Left,Medial Lower Leg: Return Appointment in 1 week. Nurse Visit as needed Edema Control: Wound #12 Left,Lateral Lower Leg: 4-Layer Compression System - Left Lower Extremity. Patient to wear own compression stockings - Right leg Compression Pump: Use compression pump on left lower extremity for 60 minutes, twice daily. - at least 45 minutes Compression Pump: Use compression pump on right lower extremity for 60 minutes, twice daily. - at least 45 minutes Wound #5 Left,Medial Lower Leg: 4-Layer Compression System - Left Lower Extremity. Patient to wear own compression stockings - Right leg Compression Pump: Use compression pump on left lower extremity for 60 minutes, twice daily. - at least 45 minutes Compression Pump: Use compression pump on right lower extremity for 60 minutes, twice daily. - at least 45 minutes 1. We have that she is unable to put her stocking on. 2. I told her she is going to have to continue her pumps twice a day. The maximum we have been able to get her up to his half an hour to 45 minutes for various reasons. Without this compliance we are not going to be able to maintain  this Electronic Signature(s) Signed: 08/03/2020 4:16:06 PM By: Linton Ham MD Entered By: Linton Ham on 08/03/2020 13:21:18 [name], HOPF ([id number]) -------------------------------------------------------------------------------- SuperBill Details Patient Name: SUZZANNE, BRUNKHORST. Date of Service: 08/03/2020 Medical Record Number: [id number] Patient Account Number: [id number] Date of Birth/Sex: 1946-08-09 (74 y.o. 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 L97.818 Non-pressure chronic ulcer of other part of right lower leg with other specified severity Facility Procedures CPT4 Code: 40086761 Description: (Facility Use Only) 29581LT - APPLY MULTLAY COMPRS LWR LT LEG Modifier: Quantity: 1 Physician Procedures CPT4 Code Description: [id number] - WC PHYS LEVEL 3 - EST PT Modifier:  Quantity: 1 CPT4 Code Description: ICD-10 Diagnosis Description L97.221 Non-pressure chronic ulcer of left calf limited to breakdown of skin I89.0 Lymphedema, not elsewhere classified I87.332 Chronic venous hypertension (idiopathic) with ulcer and inflammation of  left Modifier: lower extremity Quantity: Electronic Signature(s) Signed: 08/03/2020 4:16:06 PM By: Linton Ham MD Previous Signature: 08/03/2020 1:12:42 PM Version By: Army Melia Entered By: Linton Ham on 08/03/2020 13:21:40

## 2020-08-10 ENCOUNTER — Encounter: Payer: Medicare Other | Attending: Internal Medicine | Admitting: Internal Medicine

## 2020-08-10 ENCOUNTER — Other Ambulatory Visit: Payer: Self-pay

## 2020-08-10 DIAGNOSIS — Z86718 Personal history of other venous thrombosis and embolism: Secondary | ICD-10-CM | POA: Insufficient documentation

## 2020-08-10 DIAGNOSIS — J45909 Unspecified asthma, uncomplicated: Secondary | ICD-10-CM | POA: Insufficient documentation

## 2020-08-10 DIAGNOSIS — L97821 Non-pressure chronic ulcer of other part of left lower leg limited to breakdown of skin: Secondary | ICD-10-CM | POA: Diagnosis not present

## 2020-08-10 DIAGNOSIS — I872 Venous insufficiency (chronic) (peripheral): Secondary | ICD-10-CM | POA: Insufficient documentation

## 2020-08-10 DIAGNOSIS — G629 Polyneuropathy, unspecified: Secondary | ICD-10-CM | POA: Insufficient documentation

## 2020-08-10 DIAGNOSIS — I10 Essential (primary) hypertension: Secondary | ICD-10-CM | POA: Insufficient documentation

## 2020-08-10 DIAGNOSIS — M329 Systemic lupus erythematosus, unspecified: Secondary | ICD-10-CM | POA: Insufficient documentation

## 2020-08-10 DIAGNOSIS — Z7902 Long term (current) use of antithrombotics/antiplatelets: Secondary | ICD-10-CM | POA: Diagnosis not present

## 2020-08-10 DIAGNOSIS — L97221 Non-pressure chronic ulcer of left calf limited to breakdown of skin: Secondary | ICD-10-CM | POA: Diagnosis not present

## 2020-08-10 DIAGNOSIS — L97818 Non-pressure chronic ulcer of other part of right lower leg with other specified severity: Secondary | ICD-10-CM | POA: Insufficient documentation

## 2020-08-10 DIAGNOSIS — I890 Lymphedema, not elsewhere classified: Secondary | ICD-10-CM | POA: Insufficient documentation

## 2020-08-10 NOTE — Progress Notes (Signed)
[name], Amber ([id number]) Visit Report for 08/10/2020 HPI Details Patient Name: [name], [name]. Date of Service: 08/10/2020 12:30 PM Medical Record Number: [id number] Patient Account Number: [id number] Date of Birth/Sex: 07/10/1946 (74 y.o. F) Treating RN: Army Melia Primary Care Provider: [name] Other Clinician: Referring Provider: [name] Treating Provider/Extender: Tito Dine in Treatment: 64 History of Present Illness HPI Description: Pleasant 74 year old with history of chronic venous insufficiency. No diabetes or peripheral vascular disease. Left ABI 1.29. Questionable history of left lower extremity DVT. She developed a recurrent ulceration on her left lateral calf in December 2015, which she attributes to poor diet and subsequent lower extremity edema. She underwent endovenous laser ablation of her left greater saphenous vein in 2010. She underwent laser ablation of accessory branch of left GSV in April 2016 by Dr. Kellie Simmering at [facility]. She was previously wearing Unna boots, which she tolerated well. Tolerating 2 layer compression and cadexomer iodine. She returns to clinic for follow-up and is without new complaints. She denies any significant pain at this time. She reports persistent pain with pressure. No claudication or ischemic rest pain. No fever or chills. No drainage. READMISSION 11/13/16; this is a 74 year old woman who is not a diabetic. She is here for a review of a painful area on her left medial lower extremity. I note that she was seen here previously last year for wound I believe to be in the same area. At that time she had undergone previously a left greater saphenous vein ablation by Dr. Kellie Simmering and she had a ablation of the anterior accessory branch of the left greater saphenous vein in March 2016. Seeing that the wound actually closed over. In reviewing the history with her today the ulcer in this area has been recurrent. She  describes a biopsy of this area in 2009 that only showed stasis physiology. 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 Do not see that she has ever had arterial studies in the left leg. She tells me she has a follow-up with Dr. Kellie Simmering in roughly 10 days In any case she developed the reopening of this area roughly a month ago. On the background of this she describes rapidly increasing edema which has responded to Lasix 40 mg and metolazone 2.5 mg as well as the patient's lymph massage. She has been told she has both venous insufficiency and lymphedema but she cannot tolerate compression stockings 11/28/16; the patient saw Dr. Kellie Simmering recently. Per the patient he did arterial Dopplers in the office that did not show evidence of arterial insufficiency, per the patient he stated "treat this like an ordinary venous ulcer". She also saw her dermatologist Dr. Ronnald Ramp who felt that this was more of a vascular ulcer. In general things are improving although she arrives today with increasing bilateral lower extremity edema with weeping a deeper fluid through the wound on the left medial leg compatible with some degree of lymphedema 12/04/16; the patient's wound is fully epithelialized but I don't think fully healed. We will do another week of depression with Promogran and TCA however I suspect we'll be able to discharge her next week. This is a very unusual-looking wound which was initially a figure-of-eight type wound lying on its side surrounded by petechial like hemorrhage. She  has had venous ablation on this side. She apparently does not have an arterial issue per Dr. Kellie Simmering. She saw her dermatologist thought it was "vascular". Patient is definitely going to need ongoing compression and I  talked about this with her today she will go to elastic therapy after she leaves here next week 12/11/16; the patient's wound is not completely closed today. She has surrounding scar tissue and in further discussion with the patient it would appear that she had ulcers in this area in 2009 for a prolonged period of time ultimately requiring a punch biopsy of this area that only showed venous insufficiency. I did not previously pickup on this part of the history from the patient. 12/18/16; the patient's wound is completely epithelialized. There is no open area here. She has significant bilateral venous insufficiency with secondary lymphedema to a mild-to-moderate degree she does not have compression stockings.. She did not say anything to me when I was in the room, she told our intake nurse that she was still having pain in this area. This isn't unusual recurrent small open area. She is going to go to elastic therapy to obtain compression stockings. 12/25/16; the patient's wound is fully epithelialized. There is no open area here. The patient describes some continued episodic discomfort in this area medial left calf. However everything looks fine and healed here. She is been to elastic therapy and caught herself 15-20 mmHg stockings, they apparently were having trouble getting 20-30 mm stockings in her size 01/22/17; this is a patient we discharged from the clinic a month ago. She has a recurrent open wound on her medial left calf. She had 15 mm support stockings. I told her I thought she needed 20-30 mm compression stockings. She tells me that she has been ill with hospitalization secondary to asthma and is been found to have severe hypokalemia likely secondary to a combination of Lasix and metolazone. This morning she noted blistering and leaking fluid on the posterior part of her left leg. She called our intake nurse urgently and we was saw her this afternoon. She has not had any real discomfort here. I  don't know that she's been wearing any stockings on this leg for at least 2-3 days. ABIs in this clinic were 1.21 on the right and 1.3 on the left. She is previously seen vascular surgery who does not think that there is a peripheral arterial issue. 01/30/17; Patient arrives with no open wound on the left leg. She has been to elastic therapy and obtained 20-32mmhg below knee stockings and she has one on the right leg today. READMISSION 02/19/18; this Matera is a now 74 year old patient we've had in this clinic perhaps 3 times before. I had last looked at her from January 07 December 2016 with an area on the medial left leg. We discharged her on 12/25/16 however she had to be readmitted on 01/22/17 with a recurrence. I have in my notes that we discharged her on 20-30 mm stockings although she tells me she was only wearing support hose because she cannot get stockings on predominantly related to her cervical spine surgery/issues. She has had previous ablations done by vein and vascular in [place] including a great saphenous vein ablation on the left with an anterior accessory branch ablation I think both of these were in 2016. On one of the previous visit she had a biopsy noted 2009 that was negative. She is not felt to have an arterial issue. She is not a diabetic. She does  have a history of obstructive sleep apnea hypertension asthma as well as chronic venous insufficiency and lymphedema. On this occasion she noted 2 dry scaly patch on her left leg. She tried to put lotion on this it didn't really help. There were 2 open areas.the [name], [name] ([id number]) patient has been seeing her primary physician from 02/05/18 through 02/14/18. She had Unna boots applied. The superior wound now on the lateral left leg has closed but she's had one wound that remains open on the lateral left leg. This is not the same spot as we dealt with in 2018. ABIs in this clinic were 1.3 bilaterally 02/26/18; patient has a  small wound on the left lateral calf. Dimensions are down. She has chronic venous insufficiency and lymphedema. 03/05/18; small open area on the left lateral calf. Dimensions are down. Tightly adherent necrotic debris over the surface of the wound which was difficult to remove. Also the dressing [over collagen] stuck to the wound surface. This was removed with some difficulty as well. Change the primary dressing to Hydrofera Blue ready 03/12/18; small open area on the left lateral calf. Comes in with tightly adherent surface eschar as well as some adherent Hydrofera Blue. 03/19/18; open area on the left lateral calf. Again adherent surface eschar as well as some adherent Hydrofera Blue nonviable subcutaneous tissue. She complained of pain all week even with the reduction from 4-3 layer compression I put on last week. Also she had an increase in her ankle and calf measurements probably related to the same thing. 03/26/18; open area on the left lateral calf. A very small open area remains here. We used silver alginate starting last week as the Hydrofera Blue seem to stick to the wound bed. In using 4-layer compression 04/02/18; the open area in the left lateral calf at some adherent slough which I removed there is no open area here. We are able to transition her into her own compression stocking. Truthfully I think this is probably his support hose. However this does not maintain skin integrity will be limited. She cannot put over the toe compression stockings on because of neck problems hand problems etc. She is allergic to the lining layer of juxta lites. We might be forced to use extremitease stocking should this fail READMIT 11/24/2018 Patient is now a 74 year old woman who is not a diabetic. She has been in this clinic on at least 3 previous occasions largely with recurrent wounds on her left leg secondary to chronic venous insufficiency with secondary lymphedema. Her situation is complicated by  inability to get stockings on and an allergy to neoprene which is apparently a component and at least juxta lites and other stockings. As a result she really has not been wearing any stockings on her legs. She tells [place] that roughly 2 or 3 weeks ago she started noticing a stinging sensation just above her ankle on the left medial aspect. She has been diagnosed with pseudogout and she wondered whether this was what she was experiencing. She tried to dress this with something she bought at the store however subsequently it pulled skin off and now she has an open wound that is not improving. She has been using Vaseline gauze with a cover bandage. She saw her primary doctor last week who put an Haematologist on her. ABIs in this clinic was 1.03 on the left 2/12; the area is on the left medial ankle. Odd-looking wound with what looks to be surface epithelialization but a multitude of  small petechial openings. This clearly not closed yet. We have been using silver alginate under 3 layer compression with TCA 2/19; the wound area did not look quite as good this week. Necrotic debris over the majority of the wound surface which required debridement. She continues to have a multitude of what looked to be small petechial openings. She reminds [place] that she had a biopsy on this initially during her first outbreak in 2015 in [place] dermatology. She expresses concern about this being a possible melanoma. 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 I assured her that this does not look anything like melanoma. Except for the petechial reaction it does look like a venous insufficiency area and she certainly has evidence of this on both sides 2/26; a difficult area on the left medial ankle. The patient clearly has chronic venous hypertension with some degree of lymphedema. The odd thing about the area is the small petechial  hemorrhages. I am not really sure how to explain this. This was present last time and this is not a compression injury. We have been using Hydrofera Blue which I changed to last week 3/4; still using Hydrofera Blue. Aggressive debridement today. She does not have known arterial issues. She has seen Dr. Kellie Simmering at [facility] vascular and and has an ablation on the left. [Anterior accessory branch of the greater saphenous]. From what I remember they did not feel she had an arterial issue. The patient has had this area biopsied in 2009 at [facility] dermatology and by her recollection they said this was "stasis". She is also follow-up with dermatology locally who thought that this was more of a vascular issue 3/11; using Hydrofera Blue. Aggressive debridement today. She does not have an arterial issue. We are using 3 layer compression although we may need to go to 4. The patient has been in for multiple changes to her wrap since I last saw her a week ago. She says that the area was leaking. I do not have too much more information on what was found 01/19/19 on evaluation today patient was actually being seen for a nurse visit when unfortunately she had the area on her left lateral lower extremity as well as weeping from the right lower extremity that became apparent. Therefore we did end up actually seeing her for a full visit with myself. She is having some pain at this site as well but fortunately nothing too significant at this point. No fevers, chills, nausea, or vomiting noted at this time. 3/18-Patient is back to the clinic with the left leg venous leg ulcer, the ulcer is larger in size, has a surface that is densely adherent with fibrinous tissue, the Hydrofera Blue was used but is densely adherent and there was difficulty in removing it. The right lower extremity was also wrapped for weeping edema. Patient has a new area over the left lateral foot above the malleolus that is small and appears  to have no debris with intact surrounding skin. Patient is on increased dose of Lasix also as a means to edema management 3/25; the patient has a nonhealing venous ulcer on the medial left leg and last week developed a smaller area on the lateral left calf. We have been using Hydrofera Blue with a contact layer. 4/1; no major change in these wounds areas. Left medial and more recently left lateral calf. I tried Iodoflex last week to aid in debridement  she did not tolerate this. She stated her pain was terrible all week. She took the top layer of the 4 layer compression off. 4/8; the patient actually looks somewhat better in terms of her more prominent left lateral calf wound. There is some healthy looking tissue here. She is still complaining of a lot of discomfort. 4/15; patient in a lot of pain secondary to sciatica. She is on a prednisone taper prescribed by her primary physician. She has the 2 areas one on the left medial and more recently a smaller area on the left lateral calf. Both of these just above the malleoli 4/22; her back pain is better but she still states she is very uncomfortable and now feels she is intolerant to the [employer]. No real change in the wounds we have been using Sorbact. She has been previously intolerant to Iodoflex. There is not a lot of option about what we can use to debride this wound under compression that she no doubt needs. sHe states Ultram no longer works for her pain 4/29; no major change in the wounds slightly increased depth. Surface on the original medial wound perhaps somewhat improved however the more recent area on the lateral left ankle is 100% covered in very adherent debris we have been using Sorbact. She tolerates 4 layer compression well and her edema control is a lot better. She has not had to come in for a nurse check 5/6; no major change in the condition of the wounds. She did consent to debridement today which was done with some difficulty.  Continuing Sorbact. She did not tolerate Iodoflex. She was in for a check of her compression the day after we wrapped her last week this was adjusted but nothing much was found 5/13; no major change in the condition or area of the wounds. I was able to get a fairly aggressive debridement done on the lateral left leg wound. Even using Sorbact under compression. She came back in on Friday to have the wrap changed. She says she felt uncomfortable on the [name], [name]. ([id number]) lateral aspect of her ankle. She has a long history of chronic venous insufficiency including previous ablation surgery on this side. 5/20-Patient returns for wounds on left leg with both wounds covered in slough, with the lateral leg wound larger in size, she has been in 3 layer compression and felt more comfortable, she describes pain in ankle, in leg and pins and needles in foot, and is about to try Pamelor for this 6/3; wounds on the left lateral and left medial leg. The area medially which is the most recent of the 2 seems to have had the largest increase in dimensions. We have been using Sorbac to try and debride the surface. She has been to see orthopedics they apparently did a plain x-ray that was indeterminant. Diagnosed her with neuropathy and they have ordered an MRI to determine if there is underlying osteomyelitis. This was not high on my thought list but I suppose it is prudent. We have advised her to make an appointment with vein and vascular in [name]. She has a history of a left greater saphenous and accessory vein ablations I wonder if there is anything else that can be done from a surgical point of view to help in these difficult refractory wounds. We have previously healed this wound on one occasion but it keeps on reopening [medial side] 6/10; deep tissue culture I did last week I think on the left medial wound showed  both moderate E. coli and moderate staph aureus [MSSA]. She is going to require  antibiotics and I have chosen Augmentin. We have been using Sorbact and we have made better looking wound surface on both sides but certainly no improvement in wound area. She was back in last Friday apparently for a dressing changes the wrap was hurting her outer left ankle. She has not managed to get a hold of vein and vascular in [place]. We are going to have to make her that appointment 6/17; patient is tolerating the Augmentin. She had an MRI that I think was ordered by orthopedic surgeon this did not show osteomyelitis or an abscess did suggest cellulitis. We have been using Sorbact to the lateral and medial ankles. We have been trying to arrange a follow-up appointment with vein and vascular in [place] or did her original ablations. We apparently an area sent the request to vein and vascular in [facility] 6/24; patient has completed the Augmentin. We do not yet have a vein and vascular appointment in [place]. I am not sure what the issue is here we have asked her to call tomorrow. We are using Sorbact. Making some improvements and especially the medial wound. Both surfaces however look better medial and lateral. 7/1; the patient has been in contact with vein and vascular in [place] but has not yet received an appointment. Using Sorbact we have gradually improve the wound surface with no improvement in surface area. She is approved for Apligraf but the wound surface still is not completely viable. She has not had to come in for a dressing change 7/8; the patient has an appointment with vein and vascular on 7/31 which is a Friday afternoon. She is concerned about getting back here for [place] to dress her wounds. I think it is important to have them goal for her venous reflux/history of ablations etc. to see if anything else can be done. She apparently tested positive for 1 of the blood tests with regards to lupus and saw a rheumatologist. He has raised the issue of vasculitis again. I  have had this thought in the past however the evidence seems overwhelming that this is a venous reflux etiology. If the rheumatologist tells me there is clinical and laboratory investigation is positive for lupus I will rethink this. 7/15; the patient's wound surfaces are quite a bit better. The medial area which was her original wound now has no depth although the lateral wound which was the more recent area actually appears larger. Both with viable surfaces which is indeed better. Using Sorbact. I wanted to use Apligraf on her however there is the issue of the vein and vascular appointment on 7/31 at 2:00 in the afternoon which would not allow her to get back to be rewrapped and they would no doubt remove the graft 7/22; the patient's wound surfaces have moderate amount of debris although generally look better. The lateral one is larger with 2 small satellite areas superiorly. We are waiting for her vein and vascular appointment on 7/31. She has been approved for Apligraf which I would like to use after th 7/29; wound surfaces have improved no debridement is required we have been using Sorbact. She sees vein and vascular on Friday with this so question of whether anything can be done to lessen the likelihood of recurrence and/or speed the healing of these areas. She is already had previous ablations. She no doubt has severe venous hypertension 8/5-Patient returns at 1 week, she was in [employer]  for 3 days by her podiatrist, we have been using so backed to the wound, she has increased pain in both the wounds on the left lower leg especially the more distal one on the lateral aspect 8/12-Patient returns at 1 week and she is agreeable to having debridement in both wounds on her left leg today. We have been using Sorbact, and vascular studies were reviewed at last visit 8/19; the patient arrives with her wounds fairly clean and no debridement is required. We have used Sorbact which is really done a  nice job in cleaning up these very difficult wound surfaces. The patient saw Dr. Donzetta Matters of vascular surgery on 7/31. He did not feel that there was an arterial component. He felt that her treated greater saphenous vein is adequately addressed and that the small saphenous vein did not appear to be involved significantly. She was also noted to have deep venous reflux which is not treatable. Dr. Donzetta Matters mentioned the possibility of a central obstructive component leading to reflux and he offered her central venography. She wanted to discuss this or think about it. I have urged her to go ahead with this. She has had recurrent difficult wounds in these areas which do heal but after months in the clinic. If there is anything that can be done to reduce the likelihood of this I think it is worth it. 9/2 she is still working towards getting follow-up with Dr. Donzetta Matters to schedule her CT. Things are quite a bit worse venography. I put Apligraf on 2 weeks ago on both wounds on the medial and lateral part of her left lower leg. She arrives in clinic today with 3 superficial additional wounds above the area laterally and one below the wound medially. She describes a lot of discomfort. I think these are probably wrapped injuries. Does not look like she has cellulitis. 07/20/2019 on evaluation today patient appears to be doing somewhat poorly in regard to her lower extremity ulcers. She in fact showed signs of erythema in fact we may even be dealing with an infection at this time. Unfortunately I am unsure if this is just infection or if indeed there may be some allergic reaction that occurred as a result of the Apligraf application. With that being said that would be unusual but nonetheless not impossible in this patient is one who is unfortunately allergic to quite a bit. Currently we have been using the Sorbact which seems to do as well as anything for her. I do think we may want to obtain a culture today to see if there  is anything showing up there that may need to be addressed. 9/16; noted that last week the wounds look worse in 1 week follow-up of the Apligraf. Using Sorbact as of 2 days ago. She arrives with copious amounts of drainage and new skin breakdown on the back of the left calf. The wounds arm more substantial bilaterally. There is a fair amount of swelling in the left calf no overt DVT there is edema present I think in the left greater than right thigh. She is supposed to go on 9/28 for CT venography. The wounds on the medial and lateral calf are worse and she has new skin breakdown posteriorly at least new for me. This is almost developing into a circumferential wound area The Apligraf was taken off last week which I agree with things are not going in the right direction a culture was done we do not have that back yet. She is  on Augmentin that she started 2 days ago 9/23; dressing was changed by her nurses on Monday. In general there is no improvement in the wound areas although the area looks less angry than last week. She did get Augmentin for MSSA cultured on the 14th. She still appears to have too much swelling in the left leg even with 3 layer compression 9/30; the patient underwent her procedure on 9/28 by Dr. Donzetta Matters at [facility]. She was discovered to have the common iliac vein measuring 12.2 mm but at the level of L4-L5 measured 3 mm. After stenting it measured 10 mm. 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 Rouleaux flow in the common femoral and femoral vein was observed much improved after stenting. We are using silver alginate to the wounds on the medial and lateral ankle on the left. 4 layer compression 10/7; the patient had fluid swelling around her knee and 4 layer compression. At the advice of vein and vascular this was reduced to 3 layer which she is tolerating better. We have been using silver alginate under 3 layer compression since last  Friday 10/14; arrives with the areas on the left ankle looking a lot better. Inflammation in the area also a lot better. She came in for a nurse check on [name], [name] ([id number]) 10/9 10/21; continued nice improvement. Slight improvements in surface area of both the medial and lateral wounds on the left. A lot of the satellite lesions in the weeping erythema around these from stasis dermatitis is resolved. We have been using silver alginate 10/28; general improvement in the entire wound areas although not a lot of change in dimensions the wound certainly looks better. There is a lot less in terms of venous inflammation. Continue silver alginate this week however look towards Hydrofera Blue next week 11/4; very adherent debris on the medial wound left wound is not as bad. We have been using silver alginate. Change to [facility] today 11/11; very adherent debris on both wound areas. She went to vein and vascular last week and follow-up they put in Colver boot on this today. He says the [facility] was adherent. Wound is definitely not as good as last week. Especially on the left there the satellite lesions look more prominent 11/18; absolutely no better. erythema on lateral aspect with tenderness. 09/30/2019 on evaluation today patient appears to actually be doing better. Dr. Dellia Nims did put her on doxycycline last week which I do believe has helped her at this point. Fortunately there is no signs of active infection at this time. No fevers, chills, nausea, vomiting, or diarrhea. I do believe he may want extend the doxycycline for 7 additional days just to ensure everything does completely cleared up the patient is in agreement with that plan. Otherwise she is going require some sharp debridement today 12/2; patient is completing a 2-week course of doxycycline. I gave her this empirically for inflammation as well as infection when I last saw her 2 weeks ago. All of this seems to be better.  She is using silver alginate she has the area on the medial aspect of the larger area laterally and the 2 small satellite regions laterally above the major wound. 12/9; the patient's wound on the left medial and left lateral calf look really quite good. We have been using silver alginate. She saw vein and vascular in follow-up on 10/09/2019. She has had a previous left greater saphenous vein ablation by Dr. Oscar La in 2016.  More recently she underwent a left common iliac vein stent by Dr. 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 The swelling is improved and certainly the wounds have improved. The patient shows [place] today area on the right medial calf there is almost no wound but leaking lymphedema. She says she start this started 3 or 4 days ago. She did not traumatize it. It is not painful. She does not wear compression on that side 12/16; the patient continues to do well laterally. Medially still requiring debridement. The area on the right calf did not materialize to anything and is not currently open. We wrapped this last time. She has support stockings for that leg although I am not sure they are going to provide adequate compression 12/23; the lateral wound looks stable. Medially still requiring debridement for tightly adherent fibrinous debris. We've been using silver alginate. Surface area not any different 12/30; neither wound is any better with regards to surface and the area on the left lateral is larger. I been using silver alginate to the left lateral which look quite good last week and Sorbact to the left medial 11/11/2019. Lateral wound area actually looks better and somewhat smaller. Medial still requires a very aggressive debridement today. We have been using Sorbact on both wound areas 1/13; not much better still adherent debris bilaterally. I been using Sorbact. She has severe venous hypertension. Probably some degree of dermal fibrosis distally. I wonder whether tighter  compression might help and I am going to try that today. We also need to work on the bioburden 1/20; using Sorbact. She has severe venous hypertension status post stent placement for pelvic vein compression. We applied gentamicin last time to see if we could reduce bioburden I had some discussion with her today about the use of pentoxifylline. This is occasionally used in this setting for wounds with refractory venous insufficiency. However this interacts with Plavix. She tells me that she was put on this after stent placement for 3 months. She will call Dr. Claretha Cooper office to discuss 1/27; we are using gentamicin under Sorbact. 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 She has a stent. Wound medially is measuring smaller this week. Laterally measuring slightly larger although she has some satellite lesions superiorly 2/3; gentamicin under Sorbact under 4-layer compression. 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 She has a stent on Plavix. Her wounds are measuring smaller this week. More substantially laterally where there is a satellite lesion superiorly. 2/10; gentamicin under Sorbac. 4-layer compression. Patient communicated with Dr. Donzetta Matters at vein and vascular in [place]. He is okay with the patient coming off Plavix I will therefore start her on pentoxifylline for a 1 month trial. In general her wounds look better today. I had some concerns about swelling in the left thigh however she measures 61.5 on the right and 63 on the mid thigh which does not suggest there is any difficulty. The patient is not describing any pain. 2/17; gentamicin under Sorbac 4-layer compression. She has been on pentoxifylline for 1 week and complains of loose stool. No nausea she is eating and drinking well 2/24; the patient apparently came in 2 days ago for a nurse visit when her wrap fell down. Both areas look a little worse this week macerated medially and  satellite lesions laterally. Change to silver alginate today 3/3; wounds are larger today especially medially. She also has more swelling in her foot lower leg and I even noted some swelling in  her posterior thigh which is tender. I wonder about the patency of her stent. Fortuitously she sees Dr. Claretha Cooper group on Friday 3/10; Mrs. Favorite was seen by vein and vascular on 3/5. The patient underwent ultrasound. There was no evidence of thrombosis involving the IVC no evidence of thrombosis involving the right common iliac vein there is no evidence of thrombosis involving the right external iliac vein the left external vein is also patent. The right common iliac vein stent appears patent bilateral common femoral veins are compressible and appear patent. I was concerned about the left common iliac stent however it looks like this is functional. She has some edema in the posterior thigh that was tender she still has that this week. I also note they had trouble finding the pulses in her left foot and booked her for an ABI baseline in 4 weeks. She will follow up in 6 months for repeat IVC duplex. The patient stopped the pentoxifylline because of diarrhea. It does not look like that was being effective in any case. I have advised her to go back on her aspirin 81 mg tablet, vascular it also suggested this 3/17; comes in today with her wound surfaces a lot better. The excoriations from last week considerably better probably secondary to the TCA. We have been using silver alginate 3/24; comes in today with smaller wounds both medially and laterally. Both required debridement. There are 2 small satellite areas superiorly laterally. She also has a very odd bandlike area in the mid calf almost looking like there was a weakness in the wrap in a localized area. I would write this off as being this however anteriorly she has a small raised ballotable area that is very tender almost reminiscent of an abscess but there  was no obvious purulent surface to it. 02/04/20 upon evaluation today patient appears to be doing fairly well in regard to her wounds today. Fortunately there is no signs of active [name], [name]. ([id number]) infection at this time. No fevers, chills, nausea, vomiting, or diarrhea. She has been tolerating the dressing changes without complication. Fortunately I feel like she is showing signs of improvement although has been sometime since have seen her. Nonetheless the area of concern that Dr. Dellia Nims had last week where she had possibly an area of the wrap that was we can allow the leg to bulge appears to be doing significantly better today there is no signs of anything worsening. 4/7; the patient's wounds on her medial and lateral left leg continue to contract. We have been using a regular alginate. Last week she developed an area on the right medial lower leg which is probably a venous ulcer as well. 4/14; the wounds on her left medial and lateral lower leg continue to contract. Surface eschar. We have been using regular alginate. The area on the right medial lower leg is closed. We have been putting both legs under 4-layer contraction. The patient went back to see vein and vascular she had arterial studies done which were apparently "quite good" per the patient although I have not read their notes I have never felt she had an arterial issue. 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 This is been longstanding and refractory to exercise, leg elevation and longstanding use of compression wraps in our clinic as well as compression stockings on the times we have been able to get these to heal 4/21; we thought she actually might be close this week however she arrives in  clinic with a lot of edema in her upper left calf and into her posterior thigh. This is been an intermittent problem here. She says the wrap fell down but it was replaced with a nurse visit  on Monday. We are using calcium alginate to the wounds and the wound sizes there not terribly larger than last week but there is a lot more edema 4/28; again wound edges are smaller on both sides. Her edema is better controlled than last time. She is obtained her compression pumps from [employer] although they have not been to her home to set these up. 5/5; left medial and left lateral both look stable. I am not sure the medial is any smaller. We have been using calcium alginate under 4-layer compression. oShe had an area on the right medial. This was eschared today. We have been wrapping this as well. She does not tolerate external compression stockings due to a history of various contact allergies. She has her compression pumps however the representative from the company is coming on her to show her how to use these tomorrow 5/19; patient with severe chronic venous insufficiency secondary to central venous disease. She had a stent placed in her left common iliac vein. She has done better since but still difficult to control wounds. She comes in today with nothing open on the right leg. Her areas on the left medial and left lateral are just about closed. We are using calcium alginate under 4-layer compression. She is using her external compression pumps at home She only has 15-20 support stockings. States she cannot get anything tighter than that on. 03/30/20-Patient returns at 1 week, the wounds on the left leg are both slightly bigger, the last week she was on 3 layer compression which started to slide down. She is starting to use her lymphedema pumps although she stated on 1 day her right ankle started to swell up and she have to stop that day. Unfortunately the open area seem to oscillate between improving to the point of healing and then flaring up all to do with effectiveness of compression or lack of due to the left leg topography not keeping the compression wraps from rolling  down 6/2 patient comes in with a 15/20 mmHg stocking on the right leg. She tells me that she developed a lot of swelling in her ankles she saw orthopedics she was felt to possibly be having a flare of pseudogout versus some other type of arthritis. She was put on steroids for a respiratory issue so that helps with the inflammation. She has not been using the pumps all week. She thinks the left thigh is more swollen than usual and I would agree with that. She has an appointment with Dr. Donzetta Matters 9 days or so from now 6/9; both wounds on the left medial and left lateral are smaller. We have been using calcium alginate under compression. She does not have an open wound on the right leg she is using a stocking and her compression pumps things are going well. She has an appointment with Dr. Donzetta Matters with regards to her stent in the left common iliac vein 6/16; the wounds on the left medial and left lateral ankle continues to contract. The patient saw Dr. Donzetta Matters and I think he seems satisfied. Ordered follow-up venous reflux studies on both sides in September. Cautioned that she may need thigh-high stockings. She has been using calcium alginate under compression on the left and her own stocking on the right leg.  She tells [place] there are no open wounds on the right 6/23; left lateral is just about closed. Medial required debridement today. We have been using calcium alginate. Extensive discussion about the compression pumps she is only using these on 25 mmHg states she could not take 40 or 30 when the wrap came out to her home to demonstrate these. He said they should not feel tight 6/30; the left lateral wound has a slight amount of eschar. . The area medially is about the same using Hydrofera Blue. 7/7; left lateral wound still has some eschar. I will remove this next week may be closed. The area medially is very small using Hydrofera Blue with improvement. Unfortunately the stockings fell down. Unfortunately the  blisters have developed at the edge of where the wrap fell. When this happened she says her legs hurt she did not use her pumps. We are not open Monday for her to come in and change the wraps and she had an appointment yesterday. She also tells me that she is going to have an MRI of her back. She is having pain radiating into her left anterior leg she thinks her from an L5 disc. She saw Dr. Ellene Route of neurosurgery 7/14; the area on the left lateral ankle area is closed. Still a small area medially however it looks better as well. We have been using Hydrofera Blue under 4-layer compression 7/21; left lateral ankle is still closed however her wound on the medial left calf is actually larger. This is probably because Hydrofera Blue got stuck to the wound. She came in for a nurse change on Friday and will do that again this week I was concerned about the amount of swelling that she had last week however she is using her compression pumps twice a day and the swelling seems well controlled 7/28; remaining wound on the left medial lower leg is smaller. We have been using moistened silver collagen under compression she is coming back for a nurse visit. For reasons that were not really clear she was just keeping her legs elevated and not using her compression pumps. I have asked her to use the compression pumps. She does not have any wounds on the right leg 06/15/20-Patient returns at 2 weeks, her LLE edema is worse and she developed a blister wound that is new and has bigger posterior calf wound on right, we are using Prisma with pad, 4 layer compression. she has been on lasix 40 mg daily 8/18; patient arrives today with things a lot worse than I remember from a few weeks ago. She was seen last week. Noted that her edema was worse and that she now had a left lateral wound as well as deteriorating edema in the medial and posterior part of the lower leg. She says she is using his or her external compression  pumps once a day although I wonder about the compliance. 8/25; weeping area on the right medial lower leg. This had actually gotten a small localized area of her compression stocking wet. oOn the left side there is a large denuded area on the posterior medial lower leg and smaller area on the lateral. This was not the original areas that we dealt with. 9/1 the patient's wound on the left leg include the left lateral and left posterior. Larger superficial wounds weeping. She has very poor edema control. Tender localized edema in the left lower medial ankle/heel probably because of localized wrap issues. She freely admits she is not using the  compression pumps. She has been up on her feet a lot. She thinks the hydrofera blue is contributing to the pain she is experiencing.. This is a complaint that I have occasionally heard 9/8; really not much improvement. The patient is still complaining of a lot of pain particularly when she uses compression pumps. I switched her SRINIDHI, LANDERS ([id number]) to silver alginate last time because she found the Hydrofera Blue to be irritating. I don't hear much difference in her description with the silver alginate. 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 She has follow-up with Dr. Donzetta Matters I think for ultrasound next month 9/15; quite a bit of improvement today. We have less edema and more epithelialization in both of her wound areas on the left medial and left lateral calf. These are not the site of her original wounds in this area. She says she has been using her compression pumps for 30 minutes twice a day, there pain issues that never quite understood. Silver alginate as the primary dressing 9/22; continued improvement. Both areas medially and laterally still have a small open area there is some eschar. She continues to complain of left medial ankle pain. Swelling in the leg is in much  better condition. We have been using silver alginate 9/29; continued improvement. Both areas medially and laterally in the left calf look as though they are close some minor surface eschar but I think this is epithelialized. She comes in today saying she has a ruptured disc at L4-L5 cannot bend over to put on her stockings. 10/6; patient comes in today with no open wounds on either leg. However her edema on the left leg in the upper one third of the lower leg is poorly controlled nonpitting. She says that she could not use the pumps for 2 days and then she has been using the last couple of days. It is not clear to me she has been able to get her stocking on. She has back problems. Mrs. Alberico has severe chronic venous insufficiency with secondary lymphedema. Her venous insufficiency is partially centrally mediated and that she is now post stent in the left common iliac vein. The [facility] Thurner's syndrome/physiology]. She follows up with them on 10/15. She wears 20/30 below-knee stockings. She is supposed to use compression pumps at home although I think her compliance about with this is been less than 100%. I have asked her to use these 3 times a day. Finally I think she has lipodermatosclerosis in the left lower leg with an inverted bottle sign. It is been a major problem controlling the edema in the left leg. The right leg we have had wounds on but not as significant a problem is on the left Electronic Signature(s) Signed: 08/10/2020 4:14:27 PM By: Linton Ham MD Entered By: Linton Ham on 08/10/2020 13:26:22 [name], [name] ([id number]) -------------------------------------------------------------------------------- Physical Exam Details Patient Name: CLARITZA, JULY. Date of Service: 08/10/2020 12:30 PM Medical Record Number: [id number] Patient Account Number: [id number] Date of Birth/Sex: 21-Jun-1946 (74 y.o. F) Treating RN: Army Melia Primary Care Provider: [name] Other  Clinician: Referring Provider: [name] Treating Provider/Extender: Tito Dine in Treatment: 48 Constitutional Patient is hypertensive.. Pulse regular and within target range for patient.Marland Kitchen Respirations regular, non-labored and within target range.. Temperature is normal and within the target range for the patient.Marland Kitchen appears in no distress. Notes Wound exam; the patient does not have an open  wound. She has severe nonpitting edema in the upper one third of her lower leg tight sclerotic skin in the lower half to two thirds. She has a stocking on the right leg I did not look at this. She has not had an arterial issue that I am aware of. I did not look at her upper thighs today however I looked at those last week and she had good edema control Electronic Signature(s) Signed: 08/10/2020 4:14:27 PM By: Linton Ham MD Entered By: Linton Ham on 08/10/2020 13:28:02 [name] ([id number]) -------------------------------------------------------------------------------- Physician Orders Details Patient Name: PAISLEIGH, MARONEY. Date of Service: 08/10/2020 12:30 PM Medical Record Number: [id number] Patient Account Number: [id number] Date of Birth/Sex: [birth year] (74 y.o. F) Treating RN: Army Melia Primary Care Provider: [name] Other Clinician: Referring Provider: [name] Treating Provider/Extender: Tito Dine in Treatment: 63 Verbal / Phone Orders: No Diagnosis Coding Discharge From [facility] o Discharge from [facility] complete Electronic Signature(s) Signed: 08/10/2020 2:10:04 PM By: Army Melia Signed: 08/10/2020 4:14:27 PM By: Linton Ham MD Entered By: Army Melia on 08/10/2020 13:08:48 MALEEKA, SABATINO ([id number]) -------------------------------------------------------------------------------- Problem List Details Patient Name: CAYLA, WIEGAND. Date of Service: 08/10/2020 12:30 PM Medical Record  Number: [id number] Patient Account Number: [id number] Date of Birth/Sex: Oct 11, 1946 (74 y.o. F) Treating RN: Army Melia Primary Care Provider: [name] Other Clinician: Referring Provider: [name] Treating Provider/Extender: Tito Dine in Treatment: 87 Active Problems ICD-10 Encounter Code Description Active Date MDM Diagnosis L97.221 Non-pressure chronic ulcer of left calf limited to breakdown of skin 01/07/2019 No Yes I89.0 Lymphedema, not elsewhere classified 12/10/2018 No Yes I87.332 Chronic venous hypertension (idiopathic) with ulcer and inflammation of 12/09/2019 No Yes left lower extremity I83.009 Varicose veins of unspecified lower extremity with ulcer of unspecified 04/13/2020 No Yes site L97.818 Non-pressure chronic ulcer of other part of right lower leg with other 10/14/2019 No Yes specified severity I82.592 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 Date of Service: 08/10/2020 12:30 PM Medical Record Number: [id number] Patient Account Number: [id number] Date of Birth/Sex: [birth year] (74 y.o. F) Treating RN: Army Melia Primary Care Provider: [name] Other Clinician: Referring Provider: [name] Treating Provider/Extender: Tito Dine in Treatment: 41 Subjective History of Present Illness (HPI) Pleasant 74 year old with history of chronic venous insufficiency. No diabetes or peripheral  vascular disease. Left ABI 1.29. Questionable history of left lower extremity DVT. She developed a recurrent ulceration on her left lateral calf in December 2015, which she attributes to poor diet and subsequent lower extremity edema. She underwent endovenous laser ablation of her left greater saphenous vein in 2010. She underwent laser ablation of accessory branch of left GSV in April 2016 by Dr. Kellie Simmering at [facility]. She was previously wearing Unna boots, which she tolerated well. Tolerating 2 layer compression and cadexomer iodine. She returns to clinic for follow-up and is without new complaints. She denies any significant pain at this time. She reports persistent pain with pressure. No claudication or ischemic rest pain. No fever or chills. No drainage. READMISSION 11/13/16; this is a 74 year old woman who is  not a diabetic. She is here for a review of a painful area on her left medial lower extremity. I note that she was seen here previously last year for wound I believe to be in the same area. At that time she had undergone previously a left greater saphenous vein ablation by Dr. Kellie Simmering and she had a ablation of the anterior accessory branch of the left greater saphenous vein in March 2016. Seeing that the wound actually closed over. In reviewing the history with her today the ulcer in this area has been recurrent. She describes a biopsy of this area in 2009 that only showed stasis physiology. 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 Do not see that she has ever had arterial studies in the left leg. She tells me she has a follow-up with Dr. Kellie Simmering in roughly 10 days In any case she developed the reopening of this area roughly a month ago. On the background of this she  describes rapidly increasing edema which has responded to Lasix 40 mg and metolazone 2.5 mg as well as the patient's lymph massage. She has been told she has both venous insufficiency and lymphedema but she cannot tolerate compression stockings 11/28/16; the patient saw Dr. Kellie Simmering recently. Per the patient he did arterial Dopplers in the office that did not show evidence of arterial insufficiency, per the patient he stated "treat this like an ordinary venous ulcer". She also saw her dermatologist Dr. Ronnald Ramp who felt that this was more of a vascular ulcer. In general things are improving although she arrives today with increasing bilateral lower extremity edema with weeping a deeper fluid through the wound on the left medial leg compatible with some degree of lymphedema 12/04/16; the patient's wound is fully epithelialized but I don't think fully healed. We will do another week of depression with Promogran and TCA however I suspect we'll be able to discharge her next week. This is a very unusual-looking wound which was initially a figure-of-eight type wound lying on its side surrounded by petechial like hemorrhage. She has had venous ablation on this side. She apparently does not have an arterial issue per Dr. Kellie Simmering. She saw her dermatologist thought it was "vascular". Patient is definitely going to need ongoing compression and I talked about this with her today she will go to elastic therapy after she leaves here next week 12/11/16; the patient's wound is not completely closed today. She has surrounding scar tissue and in further discussion with the patient it would appear that she had ulcers in this area in 2009 for a prolonged period of time ultimately requiring a punch biopsy of this area that only showed venous insufficiency. I did not previously pickup on this part of the history from the patient. 12/18/16; the patient's wound is completely epithelialized. There is no open area here. She has  significant bilateral venous insufficiency with secondary lymphedema to a mild-to-moderate degree she does not have compression stockings.. She did not say anything to me when I was in the room, she told our intake nurse that she was still having pain in this area. This isn't unusual recurrent small open area. She is going to go to elastic therapy to obtain compression stockings. 12/25/16; the patient's wound is fully epithelialized. There is no  open area here. The patient describes some continued episodic discomfort in this area medial left calf. However everything looks fine and healed here. She is been to elastic therapy and caught herself 15-20 mmHg stockings, they apparently were having trouble getting 20-30 mm stockings in her size 01/22/17; this is a patient we discharged from the clinic a month ago. She has a recurrent open wound on her medial left calf. She had 15 mm support stockings. I told her I thought she needed 20-30 mm compression stockings. She tells me that she has been ill with hospitalization secondary to asthma and is been found to have severe hypokalemia likely secondary to a combination of Lasix and metolazone. This morning she noted blistering and leaking fluid on the posterior part of her left leg. She called our intake nurse urgently and we was saw her this afternoon. She has not had any real discomfort here. I don't know that she's been wearing any stockings on this leg for at least 2-3 days. ABIs in this clinic were 1.21 on the right and 1.3 on the left. She is previously seen vascular surgery who does not think that there is a peripheral arterial issue. 01/30/17; Patient arrives with no open wound on the left leg. She has been to elastic therapy and obtained 20-37mmhg below knee stockings and she has one on the right leg today. READMISSION 02/19/18; this [place] is a now 74 year old patient we've had in this clinic perhaps 3 times before. I had last looked at her from January  07 December 2016 with an area on the medial left leg. We discharged her on 12/25/16 however she had to be readmitted on 01/22/17 with a recurrence. I have in my notes that we discharged her on 20-30 mm stockings although she tells me she was only wearing support hose because she cannot get stockings on predominantly related to her cervical spine surgery/issues. She has had previous ablations done by vein and vascular in [place] including a great saphenous vein ablation on the left with an anterior accessory branch ablation I think both of these were in 2016. On one of the previous visit she had a biopsy noted 2009 that was negative. She is not felt to have an arterial issue. She is not a diabetic. She does have a history of obstructive sleep apnea hypertension asthma as well as chronic venous insufficiency and lymphedema. On this occasion she noted 2 dry scaly patch on her left leg. She tried to put lotion on this it didn't really help. There were 2 open areas.the patient has been seeing her primary physician from 02/05/18 through 02/14/18. She had Unna boots applied. The superior wound now on the lateral left leg has closed but she's had one wound that remains open on the lateral left leg. This is not the same spot as we dealt with in 2018. ABIs in this clinic were 1.3 bilaterally [name], [name] ([id number]) 02/26/18; patient has a small wound on the left lateral calf. Dimensions are down. She has chronic venous insufficiency and lymphedema. 03/05/18; small open area on the left lateral calf. Dimensions are down. Tightly adherent necrotic debris over the surface of the wound which was difficult to remove. Also the dressing [over collagen] stuck to the wound surface. This was removed with some difficulty as well. Change the primary dressing to Hydrofera Blue ready 03/12/18; small open area on the left lateral calf. Comes in with tightly adherent surface eschar as well as some adherent Hydrofera  Blue. 03/19/18; open  area on the left lateral calf. Again adherent surface eschar as well as some adherent Hydrofera Blue nonviable subcutaneous tissue. She complained of pain all week even with the reduction from 4-3 layer compression I put on last week. Also she had an increase in her ankle and calf measurements probably related to the same thing. 03/26/18; open area on the left lateral calf. A very small open area remains here. We used silver alginate starting last week as the Hydrofera Blue seem to stick to the wound bed. In using 4-layer compression 04/02/18; the open area in the left lateral calf at some adherent slough which I removed there is no open area here. We are able to transition her into her own compression stocking. Truthfully I think this is probably his support hose. However this does not maintain skin integrity will be limited. She cannot put over the toe compression stockings on because of neck problems hand problems etc. She is allergic to the lining layer of juxta lites. We might be forced to use extremitease stocking should this fail READMIT 11/24/2018 Patient is now a 74 year old woman who is not a diabetic. She has been in this clinic on at least 3 previous occasions largely with recurrent wounds on her left leg secondary to chronic venous insufficiency with secondary lymphedema. Her situation is complicated by inability to get stockings on and an allergy to neoprene which is apparently a component and at least juxta lites and other stockings. As a result she really has not been wearing any stockings on her legs. She tells [place] that roughly 2 or 3 weeks ago she started noticing a stinging sensation just above her ankle on the left medial aspect. She has been diagnosed with pseudogout and she wondered whether this was what she was experiencing. She tried to dress this with something she bought at the store however subsequently it pulled skin off and now she has an open wound that  is not improving. She has been using Vaseline gauze with a cover bandage. She saw her primary doctor last week who put an Haematologist on her. ABIs in this clinic was 1.03 on the left 2/12; the area is on the left medial ankle. Odd-looking wound with what looks to be surface epithelialization but a multitude of small petechial openings. This clearly not closed yet. We have been using silver alginate under 3 layer compression with TCA 2/19; the wound area did not look quite as good this week. Necrotic debris over the majority of the wound surface which required debridement. She continues to have a multitude of what looked to be small petechial openings. She reminds [place] that she had a biopsy on this initially during her first outbreak in 2015 in [place] [facility]. She expresses concern about this being a possible melanoma. 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 I assured her that this does not look anything like melanoma. Except for the petechial reaction it does look like a venous insufficiency area and she certainly has evidence of this on both sides 2/26; a difficult area on the left medial ankle. The patient clearly has chronic venous hypertension with some degree of lymphedema. The odd thing about the area is the small petechial hemorrhages. I am not really sure how to explain this. This was present last time and this is not a compression injury. We have been using Hydrofera Blue which I changed to last week 3/4; still  using Hydrofera Blue. Aggressive debridement today. She does not have known arterial issues. She has seen Dr. Kellie Simmering at [facility] vein and vascular and and has an ablation on the left. [Anterior accessory branch of the greater saphenous]. From what I remember they did not feel she had an arterial issue. The patient has had this area biopsied in 2009 at [facility] dermatology and by her  recollection they said this was "stasis". She is also follow-up with dermatology locally who thought that this was more of a vascular issue 3/11; using Hydrofera Blue. Aggressive debridement today. She does not have an arterial issue. We are using 3 layer compression although we may need to go to 4. The patient has been in for multiple changes to her wrap since I last saw her a week ago. She says that the area was leaking. I do not have too much more information on what was found 01/19/19 on evaluation today patient was actually being seen for a nurse visit when unfortunately she had the area on her left lateral lower extremity as well as weeping from the right lower extremity that became apparent. Therefore we did end up actually seeing her for a full visit with myself. She is having some pain at this site as well but fortunately nothing too significant at this point. No fevers, chills, nausea, or vomiting noted at this time. 3/18-Patient is back to the clinic with the left leg venous leg ulcer, the ulcer is larger in size, has a surface that is densely adherent with fibrinous tissue, the Hydrofera Blue was used but is densely adherent and there was difficulty in removing it. The right lower extremity was also wrapped for weeping edema. Patient has a new area over the left lateral foot above the malleolus that is small and appears to have no debris with intact surrounding skin. Patient is on increased dose of Lasix also as a means to edema management 3/25; the patient has a nonhealing venous ulcer on the medial left leg and last week developed a smaller area on the lateral left calf. We have been using Hydrofera Blue with a contact layer. 4/1; no major change in these wounds areas. Left medial and more recently left lateral calf. I tried Iodoflex last week to aid in debridement she did not tolerate this. She stated her pain was terrible all week. She took the top layer of the 4 layer compression  off. 4/8; the patient actually looks somewhat better in terms of her more prominent left lateral calf wound. There is some healthy looking tissue here. She is still complaining of a lot of discomfort. 4/15; patient in a lot of pain secondary to sciatica. She is on a prednisone taper prescribed by her primary physician. She has the 2 areas one on the left medial and more recently a smaller area on the left lateral calf. Both of these just above the malleoli 4/22; her back pain is better but she still states she is very uncomfortable and now feels she is intolerant to the [employer]. No real change in the wounds we have been using Sorbact. She has been previously intolerant to Iodoflex. There is not a lot of option about what we can use to debride this wound under compression that she no doubt needs. sHe states Ultram no longer works for her pain 4/29; no major change in the wounds slightly increased depth. Surface on the original medial wound perhaps somewhat improved however the more recent area on the lateral left ankle  is 100% covered in very adherent debris we have been using Sorbact. She tolerates 4 layer compression well and her edema control is a lot better. She has not had to come in for a nurse check 5/6; no major change in the condition of the wounds. She did consent to debridement today which was done with some difficulty. Continuing Sorbact. She did not tolerate Iodoflex. She was in for a check of her compression the day after we wrapped her last week this was adjusted but nothing much was found 5/13; no major change in the condition or area of the wounds. I was able to get a fairly aggressive debridement done on the lateral left leg wound. Even using Sorbact under compression. She came back in on Friday to have the wrap changed. She says she felt uncomfortable on the lateral aspect of her ankle. She has a long history of chronic venous insufficiency including previous ablation surgery on  this side. 5/20-Patient returns for wounds on left leg with both wounds covered in slough, with the lateral leg wound larger in size, she has been in 3 layer compression and felt more comfortable, she describes pain in ankle, in leg and pins and needles in foot, and is about to try Pamelor for this 6/3; wounds on the left lateral and left medial leg. The area medially which is the most recent of the 2 seems to have had the largest increase in [place], MAGGIE SENSENEY. ([id number]) dimensions. We have been using Sorbac to try and debride the surface. She has been to see orthopedics they apparently did a plain x-ray that was indeterminant. Diagnosed her with neuropathy and they have ordered an MRI to determine if there is underlying osteomyelitis. This was not high on my thought list but I suppose it is prudent. We have advised her to make an appointment with vein and vascular in [place]. She has a history of a left greater saphenous and accessory vein ablations I wonder if there is anything else that can be done from a surgical point of view to help in these difficult refractory wounds. We have previously healed this wound on one occasion but it keeps on reopening [medial side] 6/10; deep tissue culture I did last week I think on the left medial wound showed both moderate E. coli and moderate staph aureus [MSSA]. She is going to require antibiotics and I have chosen Augmentin. We have been using Sorbact and we have made better looking wound surface on both sides but certainly no improvement in wound area. She was back in last Friday apparently for a dressing changes the wrap was hurting her outer left ankle. She has not managed to get a hold of vein and vascular in [place]. We are going to have to make her that appointment 6/17; patient is tolerating the Augmentin. She had an MRI that I think was ordered by orthopedic surgeon this did not show osteomyelitis or an abscess did suggest cellulitis. We have  been using Sorbact to the lateral and medial ankles. We have been trying to arrange a follow-up appointment with vein and vascular in [place] or did her original ablations. We apparently an area sent the request to vein and vascular in [facility] 6/24; patient has completed the Augmentin. We do not yet have a vein and vascular appointment in [place]. I am not sure what the issue is here we have asked her to call tomorrow. We are using Sorbact. Making some improvements and especially the medial wound. Both surfaces however  look better medial and lateral. 7/1; the patient has been in contact with vein and vascular in [place] but has not yet received an appointment. Using Sorbact we have gradually improve the wound surface with no improvement in surface area. She is approved for Apligraf but the wound surface still is not completely viable. She has not had to come in for a dressing change 7/8; the patient has an appointment with vein and vascular on 7/31 which is a Friday afternoon. She is concerned about getting back here for [place] to dress her wounds. I think it is important to have them goal for her venous reflux/history of ablations etc. to see if anything else can be done. She apparently tested positive for 1 of the blood tests with regards to lupus and saw a rheumatologist. He has raised the issue of vasculitis again. I have had this thought in the past however the evidence seems overwhelming that this is a venous reflux etiology. If the rheumatologist tells me there is clinical and laboratory investigation is positive for lupus I will rethink this. 7/15; the patient's wound surfaces are quite a bit better. The medial area which was her original wound now has no depth although the lateral wound which was the more recent area actually appears larger. Both with viable surfaces which is indeed better. Using Sorbact. I wanted to use Apligraf on her however there is the issue of the vein and  vascular appointment on 7/31 at 2:00 in the afternoon which would not allow her to get back to be rewrapped and they would no doubt remove the graft 7/22; the patient's wound surfaces have moderate amount of debris although generally look better. The lateral one is larger with 2 small satellite areas superiorly. We are waiting for her vein and vascular appointment on 7/31. She has been approved for Apligraf which I would like to use after th 7/29; wound surfaces have improved no debridement is required we have been using Sorbact. She sees vein and vascular on Friday with this so question of whether anything can be done to lessen the likelihood of recurrence and/or speed the healing of these areas. She is already had previous ablations. She no doubt has severe venous hypertension 8/5-Patient returns at 1 week, she was in [place] for 3 days by her podiatrist, we have been using so backed to the wound, she has increased pain in both the wounds on the left lower leg especially the more distal one on the lateral aspect 8/12-Patient returns at 1 week and she is agreeable to having debridement in both wounds on her left leg today. We have been using Sorbact, and vascular studies were reviewed at last visit 8/19; the patient arrives with her wounds fairly clean and no debridement is required. We have used Sorbact which is really done a nice job in cleaning up these very difficult wound surfaces. The patient saw Dr. Donzetta Matters of vascular surgery on 7/31. He did not feel that there was an arterial component. He felt that her treated greater saphenous vein is adequately addressed and that the small saphenous vein did not appear to be involved significantly. She was also noted to have deep venous reflux which is not treatable. Dr. Donzetta Matters mentioned the possibility of a central obstructive component leading to reflux and he offered her central venography. She wanted to discuss this or think about it. I have urged her  to go ahead with this. She has had recurrent difficult wounds in these areas which do  heal but after months in the clinic. If there is anything that can be done to reduce the likelihood of this I think it is worth it. 9/2 she is still working towards getting follow-up with Dr. Donzetta Matters to schedule her CT. Things are quite a bit worse venography. I put Apligraf on 2 weeks ago on both wounds on the medial and lateral part of her left lower leg. She arrives in clinic today with 3 superficial additional wounds above the area laterally and one below the wound medially. She describes a lot of discomfort. I think these are probably wrapped injuries. Does not look like she has cellulitis. 07/20/2019 on evaluation today patient appears to be doing somewhat poorly in regard to her lower extremity ulcers. She in fact showed signs of erythema in fact we may even be dealing with an infection at this time. Unfortunately I am unsure if this is just infection or if indeed there may be some allergic reaction that occurred as a result of the Apligraf application. With that being said that would be unusual but nonetheless not impossible in this patient is one who is unfortunately allergic to quite a bit. Currently we have been using the Sorbact which seems to do as well as anything for her. I do think we may want to obtain a culture today to see if there is anything showing up there that may need to be addressed. 9/16; noted that last week the wounds look worse in 1 week follow-up of the Apligraf. Using Sorbact as of 2 days ago. She arrives with copious amounts of drainage and new skin breakdown on the back of the left calf. The wounds arm more substantial bilaterally. There is a fair amount of swelling in the left calf no overt DVT there is edema present I think in the left greater than right thigh. She is supposed to go on 9/28 for CT venography. The wounds on the medial and lateral calf are worse and she has new skin  breakdown posteriorly at least new for me. This is almost developing into a circumferential wound area The Apligraf was taken off last week which I agree with things are not going in the right direction a culture was done we do not have that back yet. She is on Augmentin that she started 2 days ago 9/23; dressing was changed by her nurses on Monday. In general there is no improvement in the wound areas although the area looks less angry than last week. She did get Augmentin for MSSA cultured on the 14th. She still appears to have too much swelling in the left leg even with 3 layer compression 9/30; the patient underwent her procedure on 9/28 by Dr. Donzetta Matters at [facility]. She was discovered to have the common iliac vein measuring 12.2 mm but at the level of L4-L5 measured 3 mm. After stenting it measured 10 mm. 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 Rouleaux flow in the common femoral and femoral vein was observed much improved after stenting. We are using silver alginate to the wounds on the medial and lateral ankle on the left. 4 layer compression 10/7; the patient had fluid swelling around her knee and 4 layer compression. At the advice of vein and vascular this was reduced to 3 layer which she is tolerating better. We have been using silver alginate under 3 layer compression since last Friday 10/14; arrives with the areas on the left ankle looking a lot better. Inflammation  in the area also a lot better. She came in for a nurse check on 10/9 10/21; continued nice improvement. Slight improvements in surface area of both the medial and lateral wounds on the left. A lot of the satellite lesions in the weeping erythema around these from stasis dermatitis is resolved. We have been using silver alginate [name], [name] ([id number]) 10/28; general improvement in the entire wound areas although not a lot of change in dimensions the wound certainly looks better. There  is a lot less in terms of venous inflammation. Continue silver alginate this week however look towards Hydrofera Blue next week 11/4; very adherent debris on the medial wound left wound is not as bad. We have been using silver alginate. Change to [facility] today 11/11; very adherent debris on both wound areas. She went to vein and vascular last week and follow-up they put in [place] boot on this today. He says the [facility] was adherent. Wound is definitely not as good as last week. Especially on the left there the satellite lesions look more prominent 11/18; absolutely no better. erythema on lateral aspect with tenderness. 09/30/2019 on evaluation today patient appears to actually be doing better. Dr. Dellia Nims did put her on doxycycline last week which I do believe has helped her at this point. Fortunately there is no signs of active infection at this time. No fevers, chills, nausea, vomiting, or diarrhea. I do believe he may want extend the doxycycline for 7 additional days just to ensure everything does completely cleared up the patient is in agreement with that plan. Otherwise she is going require some sharp debridement today 12/2; patient is completing a 2-week course of doxycycline. I gave her this empirically for inflammation as well as infection when I last saw her 2 weeks ago. All of this seems to be better. She is using silver alginate she has the area on the medial aspect of the larger area laterally and the 2 small satellite regions laterally above the major wound. 12/9; the patient's wound on the left medial and left lateral calf look really quite good. We have been using silver alginate. She saw vein and vascular in follow-up on 10/09/2019. She has had a previous left greater saphenous vein ablation by Dr. Oscar La in 2016. More recently she underwent a left common iliac vein stent by Dr. 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 The swelling is improved and certainly  the wounds have improved. The patient shows [place] today area on the right medial calf there is almost no wound but leaking lymphedema. She says she start this started 3 or 4 days ago. She did not traumatize it. It is not painful. She does not wear compression on that side 12/16; the patient continues to do well laterally. Medially still requiring debridement. The area on the right calf did not materialize to anything and is not currently open. We wrapped this last time. She has support stockings for that leg although I am not sure they are going to provide adequate compression 12/23; the lateral wound looks stable. Medially still requiring debridement for tightly adherent fibrinous debris. We've been using silver alginate. Surface area not any different 12/30; neither wound is any better with regards to surface and the area on the left lateral is larger. I been using silver alginate to the left lateral which look quite good last week and Sorbact to the left medial 11/11/2019. Lateral wound area actually looks better and somewhat smaller. Medial still requires a  very aggressive debridement today. We have been using Sorbact on both wound areas 1/13; not much better still adherent debris bilaterally. I been using Sorbact. She has severe venous hypertension. Probably some degree of dermal fibrosis distally. I wonder whether tighter compression might help and I am going to try that today. We also need to work on the bioburden 1/20; using Sorbact. She has severe venous hypertension status post stent placement for pelvic vein compression. We applied gentamicin last time to see if we could reduce bioburden I had some discussion with her today about the use of pentoxifylline. This is occasionally used in this setting for wounds with refractory venous insufficiency. However this interacts with Plavix. She tells me that she was put on this after stent placement for 3 months. She will call Dr. Claretha Cooper office to  discuss 1/27; we are using gentamicin under Sorbact. 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 She has a stent. Wound medially is measuring smaller this week. Laterally measuring slightly larger although she has some satellite lesions superiorly 2/3; gentamicin under Sorbact under 4-layer compression. 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 She has a stent on Plavix. Her wounds are measuring smaller this week. More substantially laterally where there is a satellite lesion superiorly. 2/10; gentamicin under Sorbac. 4-layer compression. Patient communicated with Dr. Donzetta Matters at vein and vascular in [place]. He is okay with the patient coming off Plavix I will therefore start her on pentoxifylline for a 1 month trial. In general her wounds look better today. I had some concerns about swelling in the left thigh however she measures 61.5 on the right and 63 on the mid thigh which does not suggest there is any difficulty. The patient is not describing any pain. 2/17; gentamicin under Sorbac 4-layer compression. She has been on pentoxifylline for 1 week and complains of loose stool. No nausea she is eating and drinking well 2/24; the patient apparently came in 2 days ago for a nurse visit when her wrap fell down. Both areas look a little worse this week macerated medially and satellite lesions laterally. Change to silver alginate today 3/3; wounds are larger today especially medially. She also has more swelling in her foot lower leg and I even noted some swelling in her posterior thigh which is tender. I wonder about the patency of her stent. Fortuitously she sees Dr. Claretha Cooper group on Friday 3/10; Mrs. Antosh was seen by vein and vascular on 3/5. The patient underwent ultrasound. There was no evidence of thrombosis involving the IVC no evidence of thrombosis involving the right common iliac vein there is no evidence of thrombosis involving the right  external iliac vein the left external vein is also patent. The right common iliac vein stent appears patent bilateral common femoral veins are compressible and appear patent. I was concerned about the left common iliac stent however it looks like this is functional. She has some edema in the posterior thigh that was tender she still has that this week. I also note they had trouble finding the pulses in her left foot and booked her for an ABI baseline in 4 weeks. She will follow up in 6 months for repeat IVC duplex. The patient stopped the pentoxifylline because of diarrhea. It does not look like that was being effective in any case. I have advised her to go back on her aspirin 81 mg tablet, vascular it also suggested this 3/17; comes in today with her wound surfaces a lot better. The  excoriations from last week considerably better probably secondary to the TCA. We have been using silver alginate 3/24; comes in today with smaller wounds both medially and laterally. Both required debridement. There are 2 small satellite areas superiorly laterally. She also has a very odd bandlike area in the mid calf almost looking like there was a weakness in the wrap in a localized area. I would write this off as being this however anteriorly she has a small raised ballotable area that is very tender almost reminiscent of an abscess but there was no obvious purulent surface to it. 02/04/20 upon evaluation today patient appears to be doing fairly well in regard to her wounds today. Fortunately there is no signs of active infection at this time. No fevers, chills, nausea, vomiting, or diarrhea. She has been tolerating the dressing changes without complication. Fortunately I feel like she is showing signs of improvement although has been sometime since have seen her. Nonetheless the area of concern that Dr. Dellia Nims had last week where she had possibly an area of the wrap that was we can allow the leg to bulge appears to  be doing significantly better today there is no signs of anything worsening. VIRGIL, SLINGER ([id number]) 4/7; the patient's wounds on her medial and lateral left leg continue to contract. We have been using a regular alginate. Last week she developed an area on the right medial lower leg which is probably a venous ulcer as well. 4/14; the wounds on her left medial and lateral lower leg continue to contract. Surface eschar. We have been using regular alginate. The area on the right medial lower leg is closed. We have been putting both legs under 4-layer contraction. The patient went back to see vein and vascular she had arterial studies done which were apparently "quite good" per the patient although I have not read their notes I have never felt she had an arterial issue. 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 This is been longstanding and refractory to exercise, leg elevation and longstanding use of compression wraps in our clinic as well as compression stockings on the times we have been able to get these to heal 4/21; we thought she actually might be close this week however she arrives in clinic with a lot of edema in her upper left calf and into her posterior thigh. This is been an intermittent problem here. She says the wrap fell down but it was replaced with a nurse visit on Monday. We are using calcium alginate to the wounds and the wound sizes there not terribly larger than last week but there is a lot more edema 4/28; again wound edges are smaller on both sides. Her edema is better controlled than last time. She is obtained her compression pumps from [employer] although they have not been to her home to set these up. 5/5; left medial and left lateral both look stable. I am not sure the medial is any smaller. We have been using calcium alginate under 4-layer compression. She had an area on the right medial. This was eschared today. We  have been wrapping this as well. She does not tolerate external compression stockings due to a history of various contact allergies. She has her compression pumps however the representative from the company is coming on her to show her how to use these tomorrow 5/19; patient with severe chronic venous insufficiency secondary to central venous disease. She had a stent placed in her left common  iliac vein. She has done better since but still difficult to control wounds. She comes in today with nothing open on the right leg. Her areas on the left medial and left lateral are just about closed. We are using calcium alginate under 4-layer compression. She is using her external compression pumps at home She only has 15-20 support stockings. States she cannot get anything tighter than that on. 03/30/20-Patient returns at 1 week, the wounds on the left leg are both slightly bigger, the last week she was on 3 layer compression which started to slide down. She is starting to use her lymphedema pumps although she stated on 1 day her right ankle started to swell up and she have to stop that day. Unfortunately the open area seem to oscillate between improving to the point of healing and then flaring up all to do with effectiveness of compression or lack of due to the left leg topography not keeping the compression wraps from rolling down 6/2 patient comes in with a 15/20 mmHg stocking on the right leg. She tells me that she developed a lot of swelling in her ankles she saw orthopedics she was felt to possibly be having a flare of pseudogout versus some other type of arthritis. She was put on steroids for a respiratory issue so that helps with the inflammation. She has not been using the pumps all week. She thinks the left thigh is more swollen than usual and I would agree with that. She has an appointment with Dr. Donzetta Matters 9 days or so from now 6/9; both wounds on the left medial and left lateral are smaller. We have  been using calcium alginate under compression. She does not have an open wound on the right leg she is using a stocking and her compression pumps things are going well. She has an appointment with Dr. Donzetta Matters with regards to her stent in the left common iliac vein 6/16; the wounds on the left medial and left lateral ankle continues to contract. The patient saw Dr. Donzetta Matters and I think he seems satisfied. Ordered follow-up venous reflux studies on both sides in September. Cautioned that she may need thigh-high stockings. She has been using calcium alginate under compression on the left and her own stocking on the right leg. She tells [place] there are no open wounds on the right 6/23; left lateral is just about closed. Medial required debridement today. We have been using calcium alginate. Extensive discussion about the compression pumps she is only using these on 25 mmHg states she could not take 40 or 30 when the wrap came out to her home to demonstrate these. He said they should not feel tight 6/30; the left lateral wound has a slight amount of eschar. . The area medially is about the same using Hydrofera Blue. 7/7; left lateral wound still has some eschar. I will remove this next week may be closed. The area medially is very small using Hydrofera Blue with improvement. Unfortunately the stockings fell down. Unfortunately the blisters have developed at the edge of where the wrap fell. When this happened she says her legs hurt she did not use her pumps. We are not open Monday for her to come in and change the wraps and she had an appointment yesterday. She also tells me that she is going to have an MRI of her back. She is having pain radiating into her left anterior leg she thinks her from an L5 disc. She saw Dr. Ellene Route of neurosurgery 7/14;  the area on the left lateral ankle area is closed. Still a small area medially however it looks better as well. We have been using Hydrofera Blue under 4-layer  compression 7/21; left lateral ankle is still closed however her wound on the medial left calf is actually larger. This is probably because Hydrofera Blue got stuck to the wound. She came in for a nurse change on Friday and will do that again this week I was concerned about the amount of swelling that she had last week however she is using her compression pumps twice a day and the swelling seems well controlled 7/28; remaining wound on the left medial lower leg is smaller. We have been using moistened silver collagen under compression she is coming back for a nurse visit. For reasons that were not really clear she was just keeping her legs elevated and not using her compression pumps. I have asked her to use the compression pumps. She does not have any wounds on the right leg 06/15/20-Patient returns at 2 weeks, her LLE edema is worse and she developed a blister wound that is new and has bigger posterior calf wound on right, we are using Prisma with pad, 4 layer compression. she has been on lasix 40 mg daily 8/18; patient arrives today with things a lot worse than I remember from a few weeks ago. She was seen last week. Noted that her edema was worse and that she now had a left lateral wound as well as deteriorating edema in the medial and posterior part of the lower leg. She says she is using his or her external compression pumps once a day although I wonder about the compliance. 8/25; weeping area on the right medial lower leg. This had actually gotten a small localized area of her compression stocking wet. On the left side there is a large denuded area on the posterior medial lower leg and smaller area on the lateral. This was not the original areas that we dealt with. 9/1 the patient's wound on the left leg include the left lateral and left posterior. Larger superficial wounds weeping. She has very poor edema control. Tender localized edema in the left lower medial ankle/heel probably because of  localized wrap issues. She freely admits she is not using the compression pumps. She has been up on her feet a lot. She thinks the hydrofera blue is contributing to the pain she is experiencing.. This is a complaint that I have occasionally heard 9/8; really not much improvement. The patient is still complaining of a lot of pain particularly when she uses compression pumps. I switched her to silver alginate last time because she found the Hydrofera Blue to be irritating. I don't hear much difference in her description with the silver alginate. 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 She has follow-up with Dr. Donzetta Matters I think for ultrasound next month MCKINZEE, SPIRITO ([id number]) 9/15; quite a bit of improvement today. We have less edema and more epithelialization in both of her wound areas on the left medial and left lateral calf. These are not the site of her original wounds in this area. She says she has been using her compression pumps for 30 minutes twice a day, there pain issues that never quite understood. Silver alginate as the primary dressing 9/22; continued improvement. Both areas medially and laterally still have a small open area there is some eschar. She  continues to complain of left medial ankle pain. Swelling in the leg is in much better condition. We have been using silver alginate 9/29; continued improvement. Both areas medially and laterally in the left calf look as though they are close some minor surface eschar but I think this is epithelialized. She comes in today saying she has a ruptured disc at L4-L5 cannot bend over to put on her stockings. 10/6; patient comes in today with no open wounds on either leg. However her edema on the left leg in the upper one third of the lower leg is poorly controlled nonpitting. She says that she could not use the pumps for 2 days and then she has been using the last  couple of days. It is not clear to me she has been able to get her stocking on. She has back problems. Mrs. [name] has severe chronic venous insufficiency with secondary lymphedema. Her venous insufficiency is partially centrally mediated and that she is now post stent in the left common iliac vein. [facility] Thurner's syndrome/physiology]. She follows up with them on 10/15. She wears 20/30 below-knee stockings. She is supposed to use compression pumps at home although I think her compliance about with this is been less than 100%. I have asked her to use these 3 times a day. Finally I think she has lipodermatosclerosis in the left lower leg with an inverted bottle sign. It is been a major problem controlling the edema in the left leg. The right leg we have had wounds on but not as significant a problem is on the left Objective Constitutional Patient is hypertensive.. Pulse regular and within target range for patient.Marland Kitchen Respirations regular, non-labored and within target range.. Temperature is normal and within the target range for the patient.Marland Kitchen appears in no distress. Vitals Time Taken: 12:50 PM, Height: 63 in, Weight: 224.7 lbs, BMI: 39.8, Temperature: 98.9 F, Pulse: 71 bpm, Respiratory Rate: 18 breaths/min, Blood Pressure: 153/81 mmHg. General Notes: Wound exam; the patient does not have an open wound. She has severe nonpitting edema in the upper one third of her lower leg tight sclerotic skin in the lower half to two thirds. She has a stocking on the right leg I did not look at this. She has not had an arterial issue that I am aware of. I did not look at her upper thighs today however I looked at those last week and she had good edema control Integumentary (Hair, Skin) Wound #12 status is Open. Original cause of wound was Gradually Appeared. The wound is located on the Left,Lateral Lower Leg. The wound measures 0cm length x 0cm width x 0cm depth; 0cm^2 area and 0cm^3 volume. The wound is limited to  skin breakdown. There is a small amount of serous drainage noted. The wound margin is indistinct and nonvisible. There is no granulation within the wound bed. There is a small (1-33%) amount of necrotic tissue within the wound bed including Eschar. Wound #5 status is Open. Original cause of wound was Gradually Appeared. The wound is located on the Left,Medial Lower Leg. The wound measures 0cm length x 0cm width x 0cm depth; 0cm^2 area and 0cm^3 volume. The wound is limited to skin breakdown. There is a small amount of serous drainage noted. The wound margin is indistinct and nonvisible. There is no granulation within the wound bed. There is no necrotic tissue within the wound bed. 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 I have emphasized that she needs to use her compression pumps 3 times a day we have been over and over this. For 1 reason or another she does not seem to be able to maintain compliance with this. The most recent issue has been back pain 2. She follows up with vein and vascular on 10/15 with regards to her central venous obstruction. 3. I am not optimistic about her chances of maintaining skin integrity Electronic Signature(s) Signed: 08/10/2020 4:14:27 PM By: Linton Ham MD Entered By: Linton Ham on 08/10/2020  13:29:48 FALLYNN, GRAVETT ([id number]) -------------------------------------------------------------------------------- SuperBill Details Patient Name: REANNE, NELLUMS. Date of Service: 08/10/2020 Medical Record Number: [id number] Patient Account Number: [id number] Date of Birth/Sex: 08/08/1946 (74 y.o. F) Treating RN: Army Melia Primary Care Provider: [name] Other Clinician: Referring Provider: [name] Treating Provider/Extender: Tito Dine in Treatment: 87 Diagnosis Coding ICD-10 Codes Code Description L97.221 Non-pressure chronic ulcer of left calf limited to breakdown of skin I89.0 Lymphedema, not elsewhere classified I87.332 Chronic venous hypertension (idiopathic) with ulcer and inflammation of left lower extremity I83.009 Varicose veins of unspecified lower extremity with ulcer of unspecified site L97.818 Non-pressure chronic ulcer of other part of right lower leg with other specified severity I82.592 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 Facility Procedures CPT4 Code: 30131438 Description: 99213 - WOUND CARE VISIT-LEV 3 EST PT Modifier: Quantity: 1 Physician Procedures CPT4 Code Description: [id number] 28206 - WC PHYS LEVEL 3 - EST PT Modifier: Quantity: 1 CPT4 Code Description: ICD-10 Diagnosis Description L97.221 Non-pressure chronic ulcer of left calf limited to breakdown of skin I89.0 Lymphedema, not elsewhere classified I87.332 Chronic venous hypertension (idiopathic) with ulcer and inflammation of  left Modifier: lower extremity Quantity: Electronic Signature(s) Signed: 08/10/2020 4:14:27 PM By: Linton Ham MD Entered By: Linton Ham on 08/10/2020 13:30:14

## 2020-08-10 NOTE — Progress Notes (Signed)
ASHLA, MURPH ([id number]) Visit Report for 08/10/2020 Arrival Information Details Patient Name: [name], [name]. Date of Service: 08/10/2020 12:30 PM Medical Record Number: [id number] Patient Account Number: [id number] Date of Birth/Sex: May 02, 1946 (74 y.o. F) Treating RN: [name] Primary Care [name]: [name] Other Clinician: Referring [name]: [name] Treating [name]/Extender: [name] in Treatment: 64 Visit Information History Since Last Visit Added or deleted any medications: No Patient Arrived: Ambulatory Any new allergies or adverse reactions: No Arrival Time: 12:46 Had a fall or experienced change in No Accompanied By: self activities of daily living that may affect Transfer Assistance: None risk of falls: Patient Identification Verified: Yes Signs or symptoms of abuse/neglect since last visito No Secondary Verification Process Completed: Yes Hospitalized since last visit: No Patient Requires Transmission-Based No Implantable device outside of the clinic excluding No Precautions: cellular tissue based products placed in the center Patient Has Alerts: Yes since last visit: Patient Alerts: Patient on Blood Has Dressing in Place as Prescribed: Yes Thinner Has Compression in Place as Prescribed: Yes aspirin 81 Pain Present Now: No Electronic Signature(s) Signed: 08/10/2020 4:34:56 PM By: [name] Entered By: [name] on 08/10/2020 12:54:29 [name] ([id number]) -------------------------------------------------------------------------------- Clinic Level of Care Assessment Details Patient Name: [name], [name]. Date of Service: 08/10/2020 12:30 PM Medical Record Number: [id number] Patient Account Number: [id number] Date of Birth/Sex: 14-[name]-1946 (74 y.o. F) Treating RN: [name] Primary Care Ameyah Bangura: [name] Other Clinician: Referring Inas Avena: [name] Treating  [name]/Extender: [name] in Treatment: 15 Clinic Level of Care Assessment Items TOOL 4 Quantity Score []  - Use when only an EandM is performed on FOLLOW-UP visit 0 ASSESSMENTS - Nursing Assessment / Reassessment X - Reassessment of Co-morbidities (includes updates in patient status) 1 10 X- 1 5 Reassessment of Adherence to Treatment Plan ASSESSMENTS - Wound and Skin Assessment / Reassessment []  - Simple Wound Assessment / Reassessment - one wound 0 X- 2 5 Complex Wound Assessment / Reassessment - multiple wounds []  - 0 Dermatologic / Skin Assessment (not related to wound area) ASSESSMENTS - Focused Assessment X - Circumferential Edema Measurements - multi extremities 1 5 []  - 0 Nutritional Assessment / Counseling / Intervention X- 1 5 Lower Extremity Assessment (monofilament, tuning fork, pulses) []  - 0 Peripheral Arterial Disease Assessment (using hand held doppler) ASSESSMENTS - Ostomy and/or Continence Assessment and Care []  - Incontinence Assessment and Management 0 []  - 0 Ostomy Care Assessment and Management (repouching, etc.) PROCESS - Coordination of Care X - Simple Patient / Family Education for ongoing care 1 15 []  - 0 Complex (extensive) Patient / Family Education for ongoing care []  - 0 Staff obtains Programmer, systems, Records, Test Results / Process Orders []  - 0 Staff telephones HHA, Nursing Homes / Clarify orders / etc []  - 0 Routine Transfer to another Facility (non-emergent condition) []  - 0 Routine Hospital Admission (non-emergent condition) []  - 0 New Admissions / Biomedical engineer / Ordering NPWT, Apligraf, etc. []  - 0 Emergency Hospital Admission (emergent condition) X- 1 10 Simple Discharge Coordination []  - 0 Complex (extensive) Discharge Coordination PROCESS - Special Needs []  - Pediatric / Minor Patient Management 0 []  - 0 Isolation Patient Management []  - 0 Hearing / Language / Visual special needs []  - 0 Assessment of  Community assistance (transportation, D/C planning, etc.) []  - 0 Additional assistance / Altered mentation []  - 0 Support Surface(s) Assessment (bed, cushion, seat, etc.) INTERVENTIONS - Wound Cleansing / Measurement [name] J. ([id number]) []  -  0 Simple Wound Cleansing - one wound X- 2 5 Complex Wound Cleansing - multiple wounds X- 1 5 Wound Imaging (photographs - any number of wounds) []  - 0 Wound Tracing (instead of photographs) []  - 0 Simple Wound Measurement - one wound X- 2 5 Complex Wound Measurement - multiple wounds INTERVENTIONS - Wound Dressings []  - Small Wound Dressing one or multiple wounds 0 []  - 0 Medium Wound Dressing one or multiple wounds []  - 0 Large Wound Dressing one or multiple wounds []  - 0 Application of Medications - topical []  - 0 Application of Medications - injection INTERVENTIONS - Miscellaneous []  - External ear exam 0 []  - 0 Specimen Collection (cultures, biopsies, blood, body fluids, etc.) []  - 0 Specimen(s) / Culture(s) sent or taken to Lab for analysis []  - 0 Patient Transfer (multiple staff / Civil Service fast streamer / Similar devices) []  - 0 Simple Staple / Suture removal (25 or less) []  - 0 Complex Staple / Suture removal (26 or more) []  - 0 Hypo / Hyperglycemic Management (close monitor of Blood Glucose) []  - 0 Ankle / Brachial Index (ABI) - do not check if billed separately X- 1 5 Vital Signs Has the patient been seen at the hospital within the last three years: Yes Total Score: 90 Level Of Care: New/Established - Level 3 Electronic Signature(s) Signed: 08/10/2020 2:10:04 PM By: [name] Entered By: [name] on 08/10/2020 13:09:21 [name] ([id number]) -------------------------------------------------------------------------------- Encounter Discharge Information Details Patient Name: [name], [name]. Date of Service: 08/10/2020 12:30 PM Medical Record Number: [id number] Patient Account Number: [id number] Date of  Birth/Sex: [birth year] (74 y.o. F) Treating RN: [name] Primary Care [name]: [name] Other Clinician: Referring Perrin Gens: [name] Treating Lauralei Clouse/Extender: [name] in Treatment: 8 Encounter Discharge Information Items Discharge Condition: Stable Ambulatory Status: Ambulatory Discharge Destination: Home Transportation: Private Auto Accompanied By: self Schedule Follow-up Appointment: Yes Clinical Summary of Care: Electronic Signature(s) Signed: 08/10/2020 2:10:04 PM By: [name] Entered By: [name] on 08/10/2020 13:09:57 Piehl, Tenna [name] ([id number]) -------------------------------------------------------------------------------- Lower Extremity Assessment Details Patient Name: [name], [name]. Date of Service: 08/10/2020 12:30 PM Medical Record Number: [id number] Patient Account Number: [id number] Date of Birth/Sex: 1946/09/12 (74 y.o. F) Treating RN: [name] Primary Care Wadie Liew: [name] Other Clinician: Referring Eean Buss: [name] Treating Kimiyah Blick/Extender: [name] in Treatment: 87 Edema Assessment Assessed: [Left: Yes] [Right: No] Edema: [Left: Ye] [Right: s] Calf Left: Right: Point of Measurement: From Medial Instep 50 cm Ankle Left: Right: Point of Measurement: From Medial Instep 22 cm Vascular Assessment Pulses: Dorsalis Pedis Palpable: [Left:Yes] Posterior Tibial Palpable: [Left:Yes] Electronic Signature(s) Signed: 08/10/2020 4:34:56 PM By: [name] Entered By: [name] on 08/10/2020 13:00:35 [name] ([id number]) -------------------------------------------------------------------------------- Multi Wound Chart Details Patient Name: [name], [name]. Date of Service: 08/10/2020 12:30 PM Medical Record Number: [id number] Patient Account Number: [id number] Date of Birth/Sex: [birth year] (74 y.o. F) Treating RN: [name] Primary Care Karandeep Resende:  [name] Other Clinician: Referring Angellynn Kimberlin: [name] Treating Aeris Hersman/Extender: [name] in Treatment: 33 Vital Signs Height(in): 40 Pulse(bpm): 47 Weight(lbs): 224.7 Blood Pressure(mmHg): 153/81 Body Mass Index(BMI): 40 Temperature(F): 98.9 Respiratory Rate(breaths/min): 18 Photos: [N/A:N/A] Wound Location: Left, Lateral Lower Leg Left, Medial Lower Leg N/A Wounding Event: Gradually Appeared Gradually Appeared N/A Primary Etiology: Venous Leg Ulcer Lymphedema N/A Comorbid History: Cataracts, Asthma, Sleep Apnea, Cataracts, Asthma, Sleep Apnea, N/A Deep Vein Thrombosis, Deep Vein Thrombosis, Hypertension, Peripheral Venous Hypertension, Peripheral Venous Disease, Osteoarthritis, Received Disease,  Osteoarthritis, Received Chemotherapy, Received Radiation Chemotherapy, Received Radiation Date Acquired: 06/15/2020 11/19/2018 N/A Weeks of Treatment: 8 87 N/A Wound Status: Open Open N/A Measurements L x W x D (cm) 0x0x0 0x0x0 N/A Area (cm) : 0 0 N/A Volume (cm) : 0 0 N/A % Reduction in Area: 100.00% 100.00% N/A % Reduction in Volume: 100.00% 100.00% N/A Classification: Partial Thickness Full Thickness Without Exposed N/A Support Structures Exudate Amount: Small Small N/A Exudate Type: Serous Serous N/A Exudate Color: amber amber N/A Wound Margin: Indistinct, nonvisible Indistinct, nonvisible N/A Granulation Amount: None Present (0%) None Present (0%) N/A Necrotic Amount: Small (1-33%) None Present (0%) N/A Necrotic Tissue: Eschar N/A N/A Exposed Structures: Fascia: No Fascia: No N/A Fat Layer (Subcutaneous Tissue): Fat Layer (Subcutaneous Tissue): No No Tendon: No Tendon: No Muscle: No Muscle: No Joint: No Joint: No Bone: No Bone: No Limited to Skin Breakdown Limited to Skin Breakdown Epithelialization: Large (67-100%) Large (67-100%) N/A Treatment Notes Electronic Signature(s) Signed: 08/10/2020 4:14:27 PM By: Linton Ham  MD Entered By: Linton Ham on 08/10/2020 13:22:12 VALLA, [name] ([id number]) SHANIAH, BALTES ([id number]) -------------------------------------------------------------------------------- Multi-Disciplinary Care Plan Details Patient Name: [name], [name]. Date of Service: 08/10/2020 12:30 PM Medical Record Number: [id number] Patient Account Number: [id number] Date of Birth/Sex: 1946-01-16 (74 y.o. F) Treating RN: [name] Primary Care Lenah Messenger: [name] Other Clinician: Referring Adithya Difrancesco: [name] Treating Oaklen Thiam/Extender: [name] Dillon Weeks in Treatment: 26 Active Inactive Electronic Signature(s) Signed: 08/10/2020 2:10:04 PM By: [name] Signed: 08/10/2020 4:34:56 PM By: [name] Entered By: [name] on 08/10/2020 13:08:04 LYLIA, KARN ([id number]) -------------------------------------------------------------------------------- Pain Assessment Details Patient Name: [name], [name]. Date of Service: 08/10/2020 12:30 PM Medical Record Number: [id number] Patient Account Number: [id number] Date of Birth/Sex: [birth year] (74 y.o. F) Treating RN: [name] Primary Care Barri Neidlinger: [name] Other Clinician: Referring Epiphany Seltzer: [name] Treating Ulyana Pitones/Extender: [name] in Treatment: 87 Active Problems Location of Pain Severity and Description of Pain Patient Has Paino No Site Locations Pain Management and Medication Current Pain Management: Electronic Signature(s) Signed: 08/10/2020 4:34:56 PM By: [name] Entered By: [name] on 08/10/2020 12:58:46 Beckel, Tenna [name] ([id number]) -------------------------------------------------------------------------------- Patient/Caregiver Education Details Patient Name: [name], [name]. Date of Service: 08/10/2020 12:30 PM Medical Record Number: [id number] Patient Account Number: [id number] Date of Birth/Gender: May 25, 1946 (74 y.o.  F) Treating RN: [name] Primary Care Physician: [name] Other Clinician: Referring Physician: Ria [name] Treating Physician/Extender: [name] in Treatment: 39 Education Assessment Education Provided To: Patient Education Topics Provided Wound/Skin Impairment: Handouts: Caring for Your Ulcer Methods: Demonstration, Explain/Verbal Responses: State content correctly Electronic Signature(s) Signed: 08/10/2020 2:10:04 PM By: [name] Entered By: [name] on 08/10/2020 13:09:36 Kretz, Tenna [name] ([id number]) -------------------------------------------------------------------------------- Wound Assessment Details Patient Name: [name], [name]. Date of Service: 08/10/2020 12:30 PM Medical Record Number: [id number] Patient Account Number: [id number] Date of Birth/Sex: 15-[name]-1946 (74 y.o. F) Treating RN: [name] Primary Care Milbern Doescher: [name] Other Clinician: Referring Farron Watrous: [name] Treating Marye Eagen/Extender: [name] in Treatment: 87 Wound Status Wound Number: 12 Primary Venous Leg Ulcer Etiology: Wound Location: Left, Lateral Lower Leg Wound Open Wounding Event: Gradually Appeared Status: Date Acquired: 06/15/2020 Comorbid Cataracts, Asthma, Sleep Apnea, Deep Vein Thrombosis, Weeks Of Treatment: 8 History: Hypertension, Peripheral Venous Disease, Osteoarthritis, Clustered Wound: No Received Chemotherapy, Received Radiation Photos Wound Measurements Length: (cm) 0 Width: (cm) 0 Depth: (cm) 0 Area: (cm) 0 Volume: (cm) 0 % Reduction in Area: 100% % Reduction in Volume:  100% Epithelialization: Large (67-100%) Wound Description Classification: Partial Thickness Wound Margin: Indistinct, nonvisible Exudate Amount: Small Exudate Type: Serous Exudate Color: amber Foul Odor After Cleansing: No Slough/Fibrino No Wound Bed Granulation Amount: None Present (0%) Exposed Structure Necrotic  Amount: Small (1-33%) Fascia Exposed: No Necrotic Quality: Eschar Fat Layer (Subcutaneous Tissue) Exposed: No Tendon Exposed: No Muscle Exposed: No Joint Exposed: No Bone Exposed: No Limited to Skin Breakdown Electronic Signature(s) Signed: 08/10/2020 4:34:56 PM By: [name] Entered By: [name] on 08/10/2020 13:03:18 Liberto, Tenna [name] ([id number]) -------------------------------------------------------------------------------- Wound Assessment Details Patient Name: [name], [name]. Date of Service: 08/10/2020 12:30 PM Medical Record Number: [id number] Patient Account Number: [id number] Date of Birth/Sex: 01/07/1946 (74 y.o. F) Treating RN: [name] Primary Care Liona Wengert: [name] Other Clinician: Referring Glennda Weatherholtz: [name] Treating Aalayah Riles/Extender: [name] in Treatment: 87 Wound Status Wound Number: 5 Primary Lymphedema Etiology: Wound Location: Left, Medial Lower Leg Wound Open Wounding Event: Gradually Appeared Status: Date Acquired: 11/19/2018 Comorbid Cataracts, Asthma, Sleep Apnea, Deep Vein Thrombosis, Weeks Of Treatment: 87 History: Hypertension, Peripheral Venous Disease, Osteoarthritis, Clustered Wound: No Received Chemotherapy, Received Radiation Photos Wound Measurements Length: (cm) 0 Width: (cm) 0 Depth: (cm) 0 Area: (cm) Volume: (cm) % Reduction in Area: 100% % Reduction in Volume: 100% Epithelialization: Large (67-100%) 0 0 Wound Description Classification: Full Thickness Without Exposed Support Structures Wound Margin: Indistinct, nonvisible Exudate Amount: Small Exudate Type: Serous Exudate Color: amber Foul Odor After Cleansing: No Slough/Fibrino No Wound Bed Granulation Amount: None Present (0%) Exposed Structure Necrotic Amount: None Present (0%) Fascia Exposed: No Fat Layer (Subcutaneous Tissue) Exposed: No Tendon Exposed: No Muscle Exposed: No Joint Exposed: No Bone Exposed:  No Limited to Skin Breakdown Electronic Signature(s) Signed: 08/10/2020 4:34:56 PM By: [name] Entered By: [name] on 08/10/2020 13:03:49 Day, Tenna [name] ([id number]) -------------------------------------------------------------------------------- Vitals Details Patient Name: [name], [name]. Date of Service: 08/10/2020 12:30 PM Medical Record Number: [id number] Patient Account Number: [id number] Date of Birth/Sex: 07-21-1946 (74 y.o. F) Treating RN: [name] Primary Care Dimitris Shanahan: [name] Other Clinician: Referring [name] Wandel: [name] Treating Camielle Sizer/Extender: [name] in Treatment: 87 Vital Signs Time Taken: 12:50 Temperature (F): 98.9 Height (in): 63 Pulse (bpm): 71 Weight (lbs): 224.7 Respiratory Rate (breaths/min): 18 Body Mass Index (BMI): 39.8 Blood Pressure (mmHg): 153/81 Reference Range: 80 - 120 mg / dl Electronic Signature(s) Signed: 08/10/2020 4:34:56 PM By: [name] Entered By: [name] on 08/10/2020 12:58:36

## 2020-08-19 ENCOUNTER — Other Ambulatory Visit: Payer: Self-pay

## 2020-08-19 ENCOUNTER — Ambulatory Visit (HOSPITAL_COMMUNITY)
Admission: RE | Admit: 2020-08-19 | Discharge: 2020-08-19 | Disposition: A | Discharge status: Home / Self Care | Payer: Medicare Other | Source: Ambulatory Visit | Attending: Vascular Surgery | Admitting: Vascular Surgery

## 2020-08-19 ENCOUNTER — Ambulatory Visit (INDEPENDENT_AMBULATORY_CARE_PROVIDER_SITE_OTHER): Payer: Medicare Other | Admitting: Vascular Surgery

## 2020-08-19 ENCOUNTER — Encounter: Payer: Self-pay | Admitting: Vascular Surgery

## 2020-08-19 VITALS — BP 151/93 | HR 63 | Temp 97.3°F | Resp 14 | Ht 63.0 in | Wt 221.0 lb

## 2020-08-19 DIAGNOSIS — I872 Venous insufficiency (chronic) (peripheral): Secondary | ICD-10-CM

## 2020-08-19 DIAGNOSIS — R609 Edema, unspecified: Secondary | ICD-10-CM | POA: Diagnosis not present

## 2020-08-19 DIAGNOSIS — L97929 Non-pressure chronic ulcer of unspecified part of left lower leg with unspecified severity: Secondary | ICD-10-CM

## 2020-08-19 DIAGNOSIS — I871 Compression of vein: Secondary | ICD-10-CM | POA: Diagnosis not present

## 2020-08-19 DIAGNOSIS — I83029 Varicose veins of left lower extremity with ulcer of unspecified site: Secondary | ICD-10-CM

## 2020-08-19 DIAGNOSIS — I83892 Varicose veins of left lower extremities with other complications: Secondary | ICD-10-CM | POA: Diagnosis not present

## 2020-08-19 NOTE — Progress Notes (Signed)
Patient ID: [name], female   DOB: 03-Jun-1946, 74 y.o.   MRN: [id number]  Reason for Consult: Wound Check and Venous Insufficiency   Referred by [name], MD  Subjective:     HPI:  [name] is a 74 y.o. female history of pregreater saphenous vein and accessory saphenous vein ablation in [facility].  She also has treatment of May Thurner's by myself.  She is on aspirin therapy at this time.  She has healed her wounds bilateral lower extremities.  She does have venous reflux testing today.  She states the swelling is worse on the left.  Overall her skin is much healthier.  She is wearing compression stockings and has for many years religiously.  On the left she does have some thigh swelling as well she has significant bilateral lower extremity varicosities as well.  Past Medical History:  Diagnosis Date  . Allergy   . Anesthesia complication    trouble waking up 2/2 CPAP  . Arthritis   . Asthma in adult   . Cataract 2019   left eye resolved with surgery  . Cervical spondylosis 2012   Jacelyn Grip, [place])  . Choroidal nevus of left eye 03/22/2016  . Chronic venous insufficiency 2016   severe reflux with painful varicosities sees [facility]  . Clotting disorder ([place])    due to vein ablation   . Complication of anesthesia    difficulty coming out due to sleep apnea per pt  . Difficult intubation    PATIENT DENIES   . Diverticulosis    per ct scans 09-2016 and 01-2017  . DVT (deep venous thrombosis) ([place]) 2011   small, developed after venous ablation  . DVT (deep venous thrombosis) ([place])   . Family history of adverse reaction to anesthesia    O2 levels drop upon waking   . Hearing loss sensory, bilateral 2013   mod-severe high freq sensorineural ([facility])  . Hiatal hernia     09-2016 ct scan [facility] at cancer center   . History of kidney stones 1980s  . History of radiation therapy 07/19/11-08/25/2011   RIGHT AXILLARY REGION/METASTATIC  . Hypertension   . Melanoma  ([place]) 06/29/10   MALIGNANT MELANOMA R SHOULDER/SUPRASCAPULAR BACK s/p interferon chemo and XRT  . Neuromuscular disorder ([facility])    muscle spasms  . OSA (obstructive sleep apnea)    on CPAP  . Pneumonia    2001  . RLS (restless legs syndrome)   . Seasonal allergies   . Sleep apnea    wears cpap  . Tinnitus   . Unspecified vitamin D deficiency 03/18/2013  . Varicose veins of left lower extremity    Family History  Problem Relation Age of Onset  . Cancer Mother 49       uterine  . CAD Father 55       MI  . Hypertension Father 80  . Alzheimer's disease Father 78  . Cancer Cousin        x2, breast  . Diabetes Sister   . Cancer Paternal Grandmother        melanoma, possibly  . Colon cancer Neg Hx   . Colon polyps Neg Hx   . Rectal cancer Neg Hx   . Stomach cancer Neg Hx    Past Surgical History:  Procedure Laterality Date  . ABI  2016   WNL  . ANTERIOR CERVICAL DECOMP/DISCECTOMY FUSION N/A 08/22/2015   ANTERIOR CERVICAL DECOMPRESSION FUSION C3/4 - interbody graft and anterior  plate; Kristeen Miss, MD  . ANTERIOR CERVICAL DECOMP/DISCECTOMY FUSION N/A 07/02/2016   Procedure: Cervical four- five Cervical five- six Cervical six- seven Anterior cervical decompression/diskectomy/fusion;  Surgeon: Kristeen Miss, MD;  Location: [facility];  Service: Neurosurgery;  Laterality: N/A;  C4-5 C5-6 C6-7 Anterior cervical decompression/diskectomy/fusion  . BREAST BIOPSY Left 2013   BENIGN  . BREAST SURGERY Right 1990   BX   . CARDIOVASCULAR STRESS TEST  2010   normal stress test, EF 66%  . CATARACT EXTRACTION W/ INTRAOCULAR LENS IMPLANT Left 2019   Dr. Kathrin Penner  . COLONOSCOPY  03/2005   benign polyp, int hem, rpt 5 yrs ([place])  . COLONOSCOPY WITH PROPOFOL N/A 01/06/2018   diverticulosis, decreased sphincter tone, no f/u needed [name], Kirke Corin, MD)  . DEEP AXILLARY SENTINEL NODE BIOPSY / EXCISION  2012   RIGHT  . DEXA  06/2016   WNL  . DILATION AND CURETTAGE OF UTERUS   2010  . ENDOVENOUS ABLATION SAPHENOUS VEIN W/ LASER Left 2010   [facility]  . ENDOVENOUS ABLATION SAPHENOUS VEIN W/ LASER Left 2016   accessory branch [facility] Kellie Simmering)  . ESOPHAGOGASTRODUODENOSCOPY  2006   mod chronic carditis, no H pylori ([place], [place])  . INTRAVASCULAR ULTRASOUND/IVUS N/A 08/03/2019   Waynetta Sandy, [place]   left  . LOWER EXTREMITY VENOGRAPHY Left 08/03/2019   Waynetta Sandy, MD  . LUMBAR LAMINECTOMY  2001   L4/5  . MELANOMA EXCISION  2011   Right shoulder, with sentinel lymph node biopsy  . PERIPHERAL VASCULAR INTERVENTION Left 08/03/2019   Stent of left common iliac vein with 14 x 60 mm Vici Donzetta Matters, [place] Dom, MD)  . PORT-A-CATH REMOVAL  12/05/2011   Procedure: REMOVAL PORT-A-CATH;  Surgeon: Rolm Bookbinder, MD;  Location: [place];  Service: General;  Laterality: N/A;  left port removal  . PORTACATH PLACEMENT     left subclavian  . UPPER GASTROINTESTINAL ENDOSCOPY      Short Social History:  Social History   Tobacco Use  . Smoking status: Former Smoker    Types: Cigarettes    Quit date: 11/05/1965    Years since quitting: 54.8  . Smokeless tobacco: Never Used  . Tobacco comment: socially as a teen  Substance Use Topics  . Alcohol use: No    Alcohol/week: 0.0 standard drinks    Allergies  Allergen Reactions  . Sulfa Antibiotics Itching, Swelling and Shortness Of Breath    Facial swelling  . Voltaren [Diclofenac Sodium] Shortness Of Breath  . Latex Other (See Comments)    Blisters ONLY HAD REACTION TO TAPE  (??? ONLY ADHESIVE ALLERGY)  . Tape Other (See Comments)    Caused blisters - must use paper tape - same reaction from NeoPrene  . Doxycycline Other (See Comments)    Diaphoresis and dizziness  . Other     Neoprene    Current Outpatient Medications  Medication Sig Dispense Refill  . acetaminophen (TYLENOL ARTHRITIS PAIN) 650 MG CR tablet Take 1,300 mg by mouth every 8 (eight)  hours as needed for pain.     Marland Kitchen albuterol (VENTOLIN HFA) 108 (90 Base) MCG/ACT inhaler INHALE 2 PUFFS BY MOUTH EVERY 6 HORUS AS NEEDED FOR WHEEZING/SHORTNESS OF BREATH 8.5 g 3  . aspirin EC 81 MG tablet Take 81 mg by mouth at bedtime. Melanoma prevention    . famotidine (PEPCID) 20 MG tablet Take 1 tablet (20 mg total) by mouth  at bedtime.    . furosemide (LASIX) 40 MG tablet Take 1 tablet (40 mg total) by mouth 2 (two) times daily as needed for fluid. Take second dose if needed for leg swelling 60 tablet 6  . loratadine (CLARITIN) 10 MG tablet Take 10 mg by mouth daily.    Marland Kitchen losartan (COZAAR) 25 MG tablet TAKE 2 TABLETS BY MOUTH EVERY DAY 60 tablet 5  . meloxicam (MOBIC) 15 MG tablet TAKE 1 TABLET BY MOUTH EVERY DAY AS NEEDED FOR PAIN 30 tablet 1  . montelukast (SINGULAIR) 10 MG tablet TAKE 1 TABLET BY MOUTH EVERY DAY 30 tablet 3  . Multiple Vitamin (MULTIVITAMIN WITH MINERALS) TABS tablet Take 1 tablet by mouth daily.    . Polyvinyl Alcohol-Povidone (TEARS PLUS OP) Place 1 drop into both eyes daily as needed (dry eyes/ redness/ burning).     . potassium chloride SA (KLOR-CON) 20 MEQ tablet Take 1 tablet (20 mEq total) by mouth 2 (two) times daily. 180 tablet 3  . PRESCRIPTION MEDICATION CPAP    . Probiotic Product (PROBIOTIC DAILY PO) Take 1 tablet by mouth daily.     Marland Kitchen triamcinolone (NASACORT ALLERGY 24HR) 55 MCG/ACT AERO nasal inhaler Place 2 sprays into the nose daily. In each nostril    . UNABLE TO FIND Take by mouth.    . Vitamin D, Ergocalciferol, (DRISDOL) 1.25 MG (50000 UNIT) CAPS capsule TAKE 1 CAPSULE (50,000 UNITS TOTAL) BY MOUTH EVERY 7 (SEVEN) DAYS. 12 capsule 3   No current facility-administered medications for this visit.    Review of Systems  Constitutional:  Constitutional negative. HENT: HENT negative.  Eyes: Eyes negative.  Cardiovascular: Positive for leg swelling.  GI: Gastrointestinal negative.  Musculoskeletal: Positive for leg pain.  Skin: Positive for wound.    Neurological: Neurological negative. Hematologic: Hematologic/lymphatic negative.  Psychiatric: Psychiatric negative.        Objective:  Objective   Vitals:   08/19/20 1411  BP: (!) 151/93  Pulse: 63  Resp: 14  Temp: (!) 97.3 F (36.3 C)  TempSrc: Temporal  SpO2: 100%  Weight: 221 lb (100.2 kg)  Height: 5\' 3"  (1.6 m)   Body mass index is 39.15 kg/m.  Physical Exam HENT:     Head: Normocephalic.     Nose:     Comments: Wearing a mask Eyes:     Extraocular Movements: Extraocular movements intact.     Pupils: Pupils are equal, round, and reactive to light.  Cardiovascular:     Rate and Rhythm: Normal rate.  Pulmonary:     Breath sounds: Normal breath sounds.  Abdominal:     General: Abdomen is flat.     Palpations: Abdomen is soft.  Musculoskeletal:     Right lower leg: Edema present.     Left lower leg: Edema present.     Comments: Shiny skin left lower extremity  Skin:    Capillary Refill: Capillary refill takes less than 2 seconds.  Neurological:     General: No focal deficit present.     Mental Status: She is alert.  Psychiatric:        Mood and Affect: Mood normal.        Behavior: Behavior normal.        Thought Content: Thought content normal.        Judgment: Judgment normal.     Data: Summary:  Right:  - No evidence of deep vein thrombosis seen in the right lower extremity,  from the common femoral through  the popliteal veins.  - No evidence of superficial venous reflux seen in the right short  saphenous vein.  - Venous reflux is noted in the right common femoral vein.  - Venous reflux is noted in the right greater saphenous vein in the thigh.  - Venous reflux is noted in the right greater saphenous vein in the calf.    Left:  - No evidence of deep vein thrombosis seen in the left lower extremity,  from the common femoral through the popliteal veins.  - Venous reflux is noted in the left common femoral vein.  - Venous reflux is noted in  the left short saphenous vein.      Assessment/Plan:     74 year old female with the above-noted procedures in the past including ablation of her left greater saphenous vein and accessory saphenous vein.  She does have significant amounts of varicosities bilaterally.  At this time she has healed all of her wounds.  She has C5 venous insufficiency.  I do need to follow her up in several months with duplex of her common iliac vein stent on the left although this was patent at our last visit she does have some thigh swelling to go along with her lower extremity swelling in the left is certainly worse.  I would like her to see one of my partners to evaluate her for any other intervention she may be a candidate for including perforator veins and small saphenous vein ablation versus sclerotherapy.  We can get this set up at the next available appointment and I will see her back to evaluate her central venous stent in the future.     Waynetta Sandy MD [facility]

## 2020-08-22 ENCOUNTER — Encounter: Payer: Self-pay | Admitting: Family Medicine

## 2020-08-22 ENCOUNTER — Other Ambulatory Visit: Payer: Self-pay

## 2020-08-22 ENCOUNTER — Ambulatory Visit (INDEPENDENT_AMBULATORY_CARE_PROVIDER_SITE_OTHER): Payer: Medicare Other | Admitting: Family Medicine

## 2020-08-22 VITALS — BP 136/90 | HR 80 | Temp 97.6°F | Resp 12 | Ht 62.99 in | Wt 228.6 lb

## 2020-08-22 DIAGNOSIS — I872 Venous insufficiency (chronic) (peripheral): Secondary | ICD-10-CM

## 2020-08-22 DIAGNOSIS — Z23 Encounter for immunization: Secondary | ICD-10-CM | POA: Diagnosis not present

## 2020-08-22 DIAGNOSIS — M79605 Pain in left leg: Secondary | ICD-10-CM

## 2020-08-22 DIAGNOSIS — M79604 Pain in right leg: Secondary | ICD-10-CM

## 2020-08-22 DIAGNOSIS — R21 Rash and other nonspecific skin eruption: Secondary | ICD-10-CM | POA: Diagnosis not present

## 2020-08-22 MED ORDER — TRIAMCINOLONE ACETONIDE 0.1 % EX CREA
1.0000 | TOPICAL_CREAM | Freq: Two times a day (BID) | CUTANEOUS | 1 refills | Status: AC
Start: 2020-08-22 — End: 2021-08-22

## 2020-08-22 NOTE — Progress Notes (Signed)
This visit was conducted in person.  BP 136/90 (BP Location: Left Arm, Patient Position: Sitting)   Pulse 80   Temp 97.6 F (36.4 C)   Resp 12   Ht 5' 2.99" (1.6 m)   Wt 228 lb 9.6 oz (103.7 kg)   SpO2 99%   BMI 40.50 kg/m    CC: check rash Subjective:    Patient ID: [name], female    DOB: 25-[name]-1946, 74 y.o.   MRN: [id number]  HPI: [name] is a 74 y.o. female presenting on 08/22/2020 for Rash (Pt c/o rash along left leg extending from knee to ankle x2-3days)   16 cats at home. Denies cat scratches.   2-3d h/o L leg redness with increased swelling. No leg injury or break in skin noted. Tender, not itchy.   No fevers/chills, nausea. No increased pain  Has been using compression stockings 15-25mHg. More trouble with stockings as day progresses. Continues using lymphedema pump without much benefit.   [facility] - released from care 2-3 wks ago.  [facility] last week.  Known chronic venous insufficiency.      Relevant past medical, surgical, family and social history reviewed and updated as indicated. Interim medical history since our last visit reviewed. Allergies and medications reviewed and updated. Outpatient Medications Prior to Visit  Medication Sig Dispense Refill  . acetaminophen (TYLENOL ARTHRITIS PAIN) 650 MG CR tablet Take 1,300 mg by mouth every 8 (eight) hours as needed for pain.     .Marland Kitchenalbuterol (VENTOLIN HFA) 108 (90 Base) MCG/ACT inhaler INHALE 2 PUFFS BY MOUTH EVERY 6 HORUS AS NEEDED FOR WHEEZING/SHORTNESS OF BREATH 8.5 g 3  . aspirin EC 81 MG tablet Take 81 mg by mouth at bedtime. Melanoma prevention    . famotidine (PEPCID) 20 MG tablet Take 1 tablet (20 mg total) by mouth at bedtime.    . furosemide (LASIX) 40 MG tablet Take 1 tablet (40 mg total) by mouth 2 (two) times daily as needed for fluid. Take second dose if needed for leg swelling 60 tablet 6  . loratadine (CLARITIN) 10 MG tablet Take 10 mg by mouth daily.      .Marland Kitchenlosartan (COZAAR) 25 MG tablet TAKE 2 TABLETS BY MOUTH EVERY DAY 60 tablet 5  . meloxicam (MOBIC) 15 MG tablet TAKE 1 TABLET BY MOUTH EVERY DAY AS NEEDED FOR PAIN 30 tablet 1  . montelukast (SINGULAIR) 10 MG tablet TAKE 1 TABLET BY MOUTH EVERY DAY 30 tablet 3  . Multiple Vitamin (MULTIVITAMIN WITH MINERALS) TABS tablet Take 1 tablet by mouth daily.    . Polyvinyl Alcohol-Povidone (TEARS PLUS OP) Place 1 drop into both eyes daily as needed (dry eyes/ redness/ burning).     . potassium chloride SA (KLOR-CON) 20 MEQ tablet Take 1 tablet (20 mEq total) by mouth 2 (two) times daily. 180 tablet 3  . PRESCRIPTION MEDICATION CPAP    . Probiotic Product (PROBIOTIC DAILY PO) Take 1 tablet by mouth daily.     .Marland Kitchentriamcinolone (NASACORT ALLERGY 24HR) 55 MCG/ACT AERO nasal inhaler Place 2 sprays into the nose daily. In each nostril    . UNABLE TO FIND Take by mouth.    . Vitamin D, Ergocalciferol, (DRISDOL) 1.25 MG (50000 UNIT) CAPS capsule TAKE 1 CAPSULE (50,000 UNITS TOTAL) BY MOUTH EVERY 7 (SEVEN) DAYS. 12 capsule 3   No facility-administered medications prior to visit.     Per HPI unless specifically indicated in ROS  section below Review of Systems Objective:  BP 136/90 (BP Location: Left Arm, Patient Position: Sitting)   Pulse 80   Temp 97.6 F (36.4 C)   Resp 12   Ht 5' 2.99" (1.6 m)   Wt 228 lb 9.6 oz (103.7 kg)   SpO2 99%   BMI 40.50 kg/m   Wt Readings from Last 3 Encounters:  08/22/20 228 lb 9.6 oz (103.7 kg)  08/19/20 221 lb (100.2 kg)  05/16/20 233 lb (105.7 kg)      Physical Exam Vitals and nursing note reviewed.  Constitutional:      Appearance: Normal appearance. She is not ill-appearing.  Musculoskeletal:        General: Swelling present. No tenderness. Normal range of motion.     Right lower leg: Edema (1+ nonpitting) present.     Left lower leg: Edema (tr nonpitting) present.     Comments:  1+ DP bilaterally L leg swelling compared to right.   Skin:    General:  Skin is warm and dry.     Findings: Erythema and rash present.     Comments: Faint pink erythema to left leg from ankle to mid calf/shin without weeping or drainage. Few healing ulcers to L leg  Neurological:     Mental Status: She is alert.       Assessment & Plan:  This visit occurred during the SARS-CoV-2 public health emergency.  Safety protocols were in place, including screening questions prior to the visit, additional usage of staff PPE, and extensive cleaning of exam room while observing appropriate contact time as indicated for disinfecting solutions.   Problem List Items Addressed This Visit    Skin rash - Primary    No significant warmth to leg. Doubt cellulitis. Will treat as stasis dermatitis with triamcinolone cream twice daily for max 2 wks at a time, continue regular moisturizer.  Will check CBC and ESR and if elevated, low threshold to treat with abx course.       Leg pain, bilateral   Chronic venous insufficiency    Other Visit Diagnoses    Need for immunization against influenza       Relevant Orders   Flu Vaccine QUAD High Dose(Fluad) (Completed)   Left leg pain       Relevant Orders   Sedimentation rate   CBC with Differential/Platelet       Meds ordered this encounter  Medications  . triamcinolone cream (KENALOG) 0.1 %    Sig: Apply 1 application topically 2 (two) times daily. Apply to AA. Max 2 weeks at a time.    Dispense:  80 g    Refill:  1   Orders Placed This Encounter  Procedures  . Flu Vaccine QUAD High Dose(Fluad)  . Sedimentation rate  . CBC with Differential/Platelet    Patient instructions: Flu shot today  Labs today.  I think rash may be from stasis dermatitis.  Treat with triamcinolone cream daily for 2 weeks and continue moisturizing cream.   Follow up plan: Return if symptoms worsen or fail to improve.  [name], MD

## 2020-08-22 NOTE — Patient Instructions (Addendum)
Flu shot today  Labs today.  I think rash may be from stasis dermatitis.  Treat with triamcinolone cream daily for 2 weeks and continue moisturizing cream.   Stasis Dermatitis Stasis dermatitis is a long-term (chronic) skin condition that happens when veins can no longer pump blood back to the heart (poor circulation). This condition causes a red or brown scaly rash or sores (ulcers) from the pooling of blood (stasis). This condition usually affects the lower legs. It may affect one leg or both legs. Without treatment, severe stasis dermatitis can lead to other skin conditions and infections. What are the causes? This condition is caused by poor circulation. What increases the risk? You are more likely to develop this condition if:  You are not very active.  You stand for long periods of time.  You have veins that have become enlarged and twisted (varicose veins).  You have leg veins that are not strong enough to send blood back to the heart (venous insufficiency).  You have had a blood clot.  You have been pregnant many times.  You have had vein surgery.  You are obese.  You have heart or kidney failure.  You are 75 years of age or older.  You have had injuries to your legs in the past. What are the signs or symptoms? Common early symptoms of this condition include:  Itchiness in one or both of your legs.  Swelling in your ankle or leg. This might get better overnight but be worse again during the day.  Skin that looks thin on your ankle and leg.  Red or brown marks that develop slowly.  Skin that is dry, cracked, or easily irritated.  Red, swollen skin that is sore or has a burning feeling.  An achy or heavy feeling after you walk or stand for long periods of time.  Pain. Later and more severe symptoms of this condition include:  Skin that looks shiny.  Small, open sores (ulcers). These are often red or purple and leak fluid.  Skin that feels  hard.  Severe itching.  A change in the shape or color of your lower legs.  Severe pain.  Difficulty walking. How is this diagnosed? This condition may be diagnosed based on:  Your symptoms and medical history.  A physical exam. You may also have tests, including:  Blood tests.  Imaging tests to check blood flow (Doppler ultrasound).  Allergy tests. You may need to see a health care provider who specializes in skin diseases (dermatologist). How is this treated? This condition may be treated with:  Compression stockings or an elastic wrap to improve circulation.  Medicines, such as: ? Corticosteroid creams and ointments. ? Non-corticosteroid medicines applied to the skin (topical). ? Medicine to reduce swelling in the legs (diuretics). ? Antibiotics. ? Medicine to relieve itching (antihistamines).  A bandage (dressing).  A wrap that contains zinc and gelatin (Unna boot). Follow these instructions at home: Skin care  Moisturize your skin as told by your health care provider. Do not use moisturizers with fragrance. This can irritate your skin.  Apply a cool, wet cloth (cool compress) to the affected areas.  Do not scratch your skin.  Do not rub your skin dry after a bath or shower. Gently pat your skin dry.  Do not use scented soaps, detergents, or perfumes. Medicines  Take or use over-the-counter and prescription medicines only as told by your health care provider.  If you were prescribed an antibiotic medicine, take or use  it as told by your health care provider. Do not stop taking or using the antibiotic even if your condition improves. Activity  Walk as told by your health care provider. Walking increases blood flow.  Do calf and ankle exercises throughout the day as told by your health care provider. This will help increase blood flow.  Raise (elevate) your legs above the level of your heart when you are sitting or lying down. Lifestyle  Work with  your health care provider to lose weight, if needed.  Do not cross your legs when you sit.  Do not stand or sit in one position for long periods of time.  Wear comfortable, loose-fitting clothing. Circulation in your legs will be worse if you wear tight pants, belts, and waistbands.  Do not use any products that contain nicotine or tobacco, such as cigarettes, e-cigarettes, and chewing tobacco. If you need help quitting, ask your health care provider. General instructions  If you were asked to use one of the following to help with your condition, follow instructions from your health care provider on how to: ? Remove and change any dressing. ? Wear compression stockings. These stockings help to prevent blood clots and reduce swelling in your legs. ? Wear the [employer].  Keep all follow-up visits as told by your health care provider. This is important. Contact a health care provider if:  Your condition does not improve with treatment.  Your condition gets worse.  You have signs of infection in the affected area. Watch for: ? Swelling. ? Tenderness. ? Redness. ? Soreness. ? Warmth.  You have a fever. Get help right away if:  You notice red streaks coming from the affected area.  Your bone or joint underneath the affected area becomes painful after the skin has healed.  The affected area turns darker.  You feel a deep pain in your leg or groin.  You are short of breath. Summary  Stasis dermatitis is a long-term (chronic) skin condition that happens when veins can no longer pump blood back to the heart (poor circulation).  Wear compression stockings as told by your health care provider. These stockings help to prevent blood clots and reduce swelling in your legs.  Follow instructions from your health care provider about activity, medicines, and lifestyle.  Contact a health care provider if you have a fever or have signs of infection in the affected area.  Keep all  follow-up visits as told by your health care provider. This is important. This information is not intended to replace advice given to you by your health care provider. Make sure you discuss any questions you have with your health care provider. Document Revised: 03/24/2018 Document Reviewed: 03/24/2018 Elsevier Patient Education  [place].

## 2020-08-22 NOTE — Assessment & Plan Note (Signed)
No significant warmth to leg. Doubt cellulitis. Will treat as stasis dermatitis with triamcinolone cream twice daily for max 2 wks at a time, continue regular moisturizer.  Will check CBC and ESR and if elevated, low threshold to treat with abx course.

## 2020-08-23 LAB — CBC WITH DIFFERENTIAL/PLATELET
Basophils Absolute: 0.1 10*3/uL (ref 0.0–0.1)
Basophils Relative: 1.3 % (ref 0.0–3.0)
Eosinophils Absolute: 0.2 10*3/uL (ref 0.0–0.7)
Eosinophils Relative: 4 % (ref 0.0–5.0)
HCT: 38.5 % (ref 36.0–46.0)
Hemoglobin: 12.7 g/dL (ref 12.0–15.0)
Lymphocytes Relative: 26 % (ref 12.0–46.0)
Lymphs Abs: 1.6 10*3/uL (ref 0.7–4.0)
MCHC: 33 g/dL (ref 30.0–36.0)
MCV: 90.9 fl (ref 78.0–100.0)
Monocytes Absolute: 0.5 10*3/uL (ref 0.1–1.0)
Monocytes Relative: 7.7 % (ref 3.0–12.0)
Neutro Abs: 3.8 10*3/uL (ref 1.4–7.7)
Neutrophils Relative %: 61 % (ref 43.0–77.0)
Platelets: 191 10*3/uL (ref 150.0–400.0)
RBC: 4.24 Mil/uL (ref 3.87–5.11)
RDW: 13.6 % (ref 11.5–15.5)
WBC: 6.2 10*3/uL (ref 4.0–10.5)

## 2020-08-23 LAB — SEDIMENTATION RATE: Sed Rate: 10 mm/hr (ref 0–30)

## 2020-08-26 NOTE — Progress Notes (Signed)
JERI, JEANBAPTISTE ([id number]) Visit Report for 07/20/2020 Arrival Information Details Patient Name: [name], [name]. Date of Service: 07/20/2020 2:15 PM Medical Record Number: [id number] Patient Account Number: [id number] Date of Birth/Sex: October 26, 1946 (74 y.o. F) Treating RN: Cornell Barman Primary Care Shamiyah Ngu: [name] Other Clinician: Referring Posie Lillibridge: [name] Treating Yvonnia Tango/Extender: Tito Dine in Treatment: 27 Visit Information History Since Last Visit Added or deleted any medications: No Patient Arrived: Ambulatory Any new allergies or adverse reactions: No Arrival Time: 14:15 Had a fall or experienced change in No Accompanied By: self activities of daily living that may affect Transfer Assistance: None risk of falls: Patient Identification Verified: Yes Signs or symptoms of abuse/neglect since last visito No Secondary Verification Process Completed: Yes Hospitalized since last visit: No Patient Requires Transmission-Based No Implantable device outside of the clinic excluding No Precautions: cellular tissue based products placed in the center Patient Has Alerts: Yes since last visit: Patient Alerts: Patient on Blood Has Dressing in Place as Prescribed: Yes Thinner Has Compression in Place as Prescribed: Yes aspirin 81 Pain Present Now: Yes Electronic Signature(s) Signed: 07/20/2020 4:20:26 PM By: Lorine Bears RCP, RRT, CHT Entered By: Lorine Bears on 07/20/2020 14:16:10 Homen, [name] ([id number]) -------------------------------------------------------------------------------- Compression Therapy Details Patient Name: [name], [name]. Date of Service: 07/20/2020 2:15 PM Medical Record Number: [id number] Patient Account Number: [id number] Date of Birth/Sex: [birth year] (74 y.o. F) Treating RN: Cornell Barman Primary Care Shareeka Yim: [name] Other Clinician: Referring Sheanna Dail: [name] Treating  Magdalena Skilton/Extender: Tito Dine in Treatment: 84 Compression Therapy Performed for Wound Assessment: Wound #12 Left,Lateral Lower Leg Performed By: Clinician Cornell Barman, RN Compression Type: Four Layer Pre Treatment ABI: 1 Post Procedure Diagnosis Same as Pre-procedure Electronic Signature(s) Signed: 08/25/2020 6:08:34 PM By: [name], [name], RN, CWS, [name], [name] Entered By: [name], [name], RN, CWS, Kim on 07/20/2020 14:52:46 [name] ([id number]) -------------------------------------------------------------------------------- Encounter Discharge Information Details Patient Name: [name], [name]. Date of Service: 07/20/2020 2:15 PM Medical Record Number: [id number] Patient Account Number: [id number] Date of Birth/Sex: 1946/07/07 (74 y.o. F) Treating RN: Cornell Barman Primary Care Marlyne Totaro: [name] Other Clinician: Referring Delynda Sepulveda: [name] Treating Anayla Giannetti/Extender: Tito Dine in Treatment: 50 Encounter Discharge Information Items Discharge Condition: Stable Ambulatory Status: Ambulatory Discharge Destination: Home Transportation: Private Auto Accompanied By: self Schedule Follow-up Appointment: Yes Clinical Summary of Care: Electronic Signature(s) Signed: 08/25/2020 6:08:34 PM By: [name], [name], RN, CWS, [name], [name] Entered By: [name], [name], RN, CWS, Kim on 07/20/2020 14:57:05 [name] ([id number]) -------------------------------------------------------------------------------- Lower Extremity Assessment Details Patient Name: [name], [name]. Date of Service: 07/20/2020 2:15 PM Medical Record Number: [id number] Patient Account Number: [id number] Date of Birth/Sex: 02-25-1946 (74 y.o. F) Treating RN: Cornell Barman Primary Care Harbour Nordmeyer: [name] Other Clinician: Referring Lalania Haseman: [name] Treating Mailin Coglianese/Extender: Tito Dine in Treatment: 84 Edema Assessment Assessed: [Left: Yes] [Right: No] Edema:  [Left: Ye] [Right: s] Calf Left: Right: Point of Measurement: 33 cm From Medial Instep 35.5 cm cm Ankle Left: Right: Point of Measurement: 13 cm From Medial Instep 23 cm cm Vascular Assessment Pulses: Dorsalis Pedis Palpable: [Left:Yes] Posterior Tibial Palpable: [Left:Yes] Electronic Signature(s) Signed: 07/20/2020 3:04:21 PM By: [name] Signed: 08/25/2020 6:08:34 PM By: [name], [name], RN, CWS, [name], [name] Entered By: [name] on 07/20/2020 14:33:33 Bradby, [name] ([id number]) -------------------------------------------------------------------------------- Multi Wound Chart Details Patient Name: [name], [name]. Date of Service: 07/20/2020 2:15 PM Medical Record Number: [id number] Patient Account Number: [id number] Date  of Birth/Sex: 07/24/1946 (74 y.o. F) Treating RN: Cornell Barman Primary Care Kalisi Bevill: [name] Other Clinician: Referring Kamiyah Kindel: [name] Treating Kandice Schmelter/Extender: Tito Dine in Treatment: 86 Vital Signs Height(in): 16 Pulse(bpm): 55 Weight(lbs): 224.7 Blood Pressure(mmHg): 150/79 Body Mass Index(BMI): 40 Temperature(F): 98.7 Respiratory Rate(breaths/min): 18 Photos: [N/A:N/A] Wound Location: Left, Lateral Lower Leg Left, Medial Lower Leg N/A Wounding Event: Gradually Appeared Gradually Appeared N/A Primary Etiology: Venous Leg Ulcer Lymphedema N/A Comorbid History: Cataracts, Asthma, Sleep Apnea, Cataracts, Asthma, Sleep Apnea, N/A Deep Vein Thrombosis, Deep Vein Thrombosis, Hypertension, Peripheral Venous Hypertension, Peripheral Venous Disease, Osteoarthritis, Received Disease, Osteoarthritis, Received Chemotherapy, Received Radiation Chemotherapy, Received Radiation Date Acquired: 06/15/2020 11/19/2018 N/A Weeks of Treatment: 5 84 N/A Wound Status: Open Open N/A Measurements L x W x D (cm) 5.5x5x0.1 6x5.5x0.1 N/A Area (cm) : 21.598 25.918 N/A Volume (cm) : 2.16 2.592 N/A % Reduction in Area:  -3722.70% -203.30% N/A % Reduction in Volume: -3689.50% -203.20% N/A Classification: Partial Thickness Full Thickness Without Exposed N/A Support Structures Exudate Amount: Medium Medium N/A Exudate Type: Serosanguineous Serosanguineous N/A Exudate Color: red, brown red, brown N/A Granulation Amount: Small (1-33%) Small (1-33%) N/A Granulation Quality: Red Red N/A Necrotic Amount: Large (67-100%) Large (67-100%) N/A Exposed Structures: Fat Layer (Subcutaneous Tissue): Fat Layer (Subcutaneous Tissue): N/A Yes Yes Fascia: No Fascia: No Tendon: No Tendon: No Muscle: No Muscle: No Joint: No Joint: No Bone: No Bone: No Epithelialization: Small (1-33%) N/A N/A Procedures Performed: Compression Therapy N/A N/A Treatment Notes Wound #12 (Left, Lateral Lower Leg) Notes scell, abd, 4LL Sinha, Khristin J. ([id number]) Wound #5 (Left, Medial Lower Leg) Notes scell, abd, 4LL Electronic Signature(s) Signed: 07/21/2020 4:37:14 PM By: Linton Ham MD Entered By: Linton Ham on 07/20/2020 14:59:08 Want, [name] ([id number]) -------------------------------------------------------------------------------- Multi-Disciplinary Care Plan Details Patient Name: DARIKA, ILDEFONSO. Date of Service: 07/20/2020 2:15 PM Medical Record Number: [id number] Patient Account Number: [id number] Date of Birth/Sex: [birth year] (74 y.o. F) Treating RN: Cornell Barman Primary Care Zamarion Longest: [name] Other Clinician: Referring Zafirah Vanzee: [name] Treating Ediberto Sens/Extender: Tito Dine in Treatment: 55 Active Inactive Medication Nursing Diagnoses: Knowledge deficit related to medication safety: actual or potential Goals: Patient/caregiver will demonstrate understanding of new oral/IV medications prescribed at the [facility] (topical prescriptions are covered under the skin breakdown problem) Date Initiated: 12/16/2019 Target Resolution Date: 01/13/2020 Goal Status:  Active Interventions: Assess for medication contraindications each visit where new medications are prescribed Treatment Activities: New medication prescribed at [place] : 12/16/2019 Notes: Soft Tissue Infection Nursing Diagnoses: Impaired tissue integrity Goals: Patient's soft tissue infection will resolve Date Initiated: 12/10/2018 Target Resolution Date: 01/09/2019 Goal Status: Active Interventions: Assess signs and symptoms of infection every visit Notes: Venous Leg Ulcer Nursing Diagnoses: Actual venous Insuffiency (use after diagnosis is confirmed) Goals: Patient will maintain optimal edema control Date Initiated: 12/10/2018 Target Resolution Date: 01/09/2019 Goal Status: Active Interventions: Assess peripheral edema status every visit. Treatment Activities: Therapeutic compression applied : 12/10/2018 Notes: Wound/Skin Impairment Nursing Diagnoses: CABELLA, KIMM ([id number]) Impaired tissue integrity Goals: Patient/caregiver will verbalize understanding of skin care regimen Date Initiated: 12/10/2018 Target Resolution Date: 01/09/2019 Goal Status: Active Interventions: Assess ulceration(s) every visit Treatment Activities: Topical wound management initiated : 12/10/2018 Notes: Electronic Signature(s) Signed: 08/25/2020 6:08:34 PM By: [name], [name], RN, CWS, [name], [name] Entered By: [name], [name], RN, CWS, Kim on 07/20/2020 14:51:59 Tancredi, [name] ([id number]) -------------------------------------------------------------------------------- Pain Assessment Details Patient Name: ZACARI, RADICK. Date of Service: 07/20/2020 2:15 PM Medical Record Number: [id number] Patient Account  Number: [id number] Date of Birth/Sex: [birth year] (74 y.o. F) Treating RN: Cornell Barman Primary Care Carmin Dibartolo: [name] Other Clinician: Referring Adom Schoeneck: [name] Treating Tex Conroy/Extender: Tito Dine in Treatment: 60 Active Problems Location of Pain Severity and  Description of Pain Patient Has Paino Yes Site Locations Pain Location: Pain in Ulcers With Dressing Change: No Duration of the Pain. Constant / Intermittento Intermittent Rate the pain. Current Pain Level: 3 Worst Pain Level: 4 Least Pain Level: 1 Tolerable Pain Level: 3 Character of Pain Describe the Pain: Burning Pain Management and Medication Current Pain Management: Electronic Signature(s) Signed: 07/20/2020 3:04:21 PM By: [name] Signed: 08/25/2020 6:08:34 PM By: [name], [name], RN, CWS, [name], [name] Entered By: [name] on 07/20/2020 14:33:21 [place], [name] ([id number]) -------------------------------------------------------------------------------- Patient/Caregiver Education Details Patient Name: SCOTLYN, MCCRANIE. Date of Service: 07/20/2020 2:15 PM Medical Record Number: [id number] Patient Account Number: [id number] Date of Birth/Gender: 26-[name]-1946 (74 y.o. F) Treating RN: Cornell Barman Primary Care Physician: [name] Other Clinician: Referring Physician: [name] Treating Physician/Extender: Tito Dine in Treatment: 62 Education Assessment Education Provided To: Patient Education Topics Provided Wound/Skin Impairment: Handouts: Caring for Your Ulcer Methods: Demonstration, Explain/Verbal Responses: State content correctly Electronic Signature(s) Signed: 08/25/2020 6:08:34 PM By: [name], [name], RN, CWS, [name], [name] Entered By: [name], [name], RN, CWS, Kim on 07/20/2020 14:56:00 [name] ([id number]) -------------------------------------------------------------------------------- Wound Assessment Details Patient Name: JACQUILYN, SELDON. Date of Service: 07/20/2020 2:15 PM Medical Record Number: [id number] Patient Account Number: [id number] Date of Birth/Sex: 1945/12/24 (74 y.o. F) Treating RN: Cornell Barman Primary Care Kienna Moncada: [name] Other Clinician: Referring Akiera Allbaugh: [name] Treating  Shantoria Ellwood/Extender: Tito Dine in Treatment: 84 Wound Status Wound Number: 12 Primary Venous Leg Ulcer Etiology: Wound Location: Left, Lateral Lower Leg Wound Open Wounding Event: Gradually Appeared Status: Date Acquired: 06/15/2020 Comorbid Cataracts, Asthma, Sleep Apnea, Deep Vein Thrombosis, Weeks Of Treatment: 5 History: Hypertension, Peripheral Venous Disease, Osteoarthritis, Clustered Wound: No Received Chemotherapy, Received Radiation Photos Wound Measurements Length: (cm) 5.5 Width: (cm) 5 Depth: (cm) 0.1 Area: (cm) 21.598 Volume: (cm) 2.16 % Reduction in Area: -3722.7% % Reduction in Volume: -3689.5% Epithelialization: Small (1-33%) Wound Description Classification: Partial Thickness Exudate Amount: Medium Exudate Type: Serosanguineous Exudate Color: red, brown Foul Odor After Cleansing: No Slough/Fibrino No Wound Bed Granulation Amount: Small (1-33%) Exposed Structure Granulation Quality: Red Fascia Exposed: No Necrotic Amount: Large (67-100%) Fat Layer (Subcutaneous Tissue) Exposed: Yes Necrotic Quality: Adherent Slough Tendon Exposed: No Muscle Exposed: No Joint Exposed: No Bone Exposed: No Electronic Signature(s) Signed: 07/20/2020 3:04:21 PM By: [name] Signed: 08/25/2020 6:08:34 PM By: [name], [name], RN, CWS, [name], [name] Entered By: [name] on 07/20/2020 14:30:42 Aracena, [name] ([id number]) -------------------------------------------------------------------------------- Wound Assessment Details Patient Name: AIDEEN, FENSTER. Date of Service: 07/20/2020 2:15 PM Medical Record Number: [id number] Patient Account Number: [id number] Date of Birth/Sex: 1946/02/09 (74 y.o. F) Treating RN: Cornell Barman Primary Care Jonne Rote: [name] Other Clinician: Referring [name] Navarrete: [name] Treating Nannie Starzyk/Extender: Tito Dine in Treatment: 84 Wound Status Wound Number: 5 Primary Lymphedema Etiology: Wound  Location: Left, Medial Lower Leg Wound Open Wounding Event: Gradually Appeared Status: Date Acquired: 11/19/2018 Comorbid Cataracts, Asthma, Sleep Apnea, Deep Vein Thrombosis, Weeks Of Treatment: 84 History: Hypertension, Peripheral Venous Disease, Osteoarthritis, Clustered Wound: No Received Chemotherapy, Received Radiation Photos Wound Measurements Length: (cm) 6 Width: (cm) 5.5 Depth: (cm) 0.1 Area: (cm) 25.918 Volume: (cm) 2.592 % Reduction in Area: -203.3% % Reduction in Volume: -203.2%  Wound Description Classification: Full Thickness Without Exposed Support Structures Exudate Amount: Medium Exudate Type: Serosanguineous Exudate Color: red, brown Foul Odor After Cleansing: No Slough/Fibrino No Wound Bed Granulation Amount: Small (1-33%) Exposed Structure Granulation Quality: Red Fascia Exposed: No Necrotic Amount: Large (67-100%) Fat Layer (Subcutaneous Tissue) Exposed: Yes Necrotic Quality: Adherent Slough Tendon Exposed: No Muscle Exposed: No Joint Exposed: No Bone Exposed: No Electronic Signature(s) Signed: 07/20/2020 3:04:21 PM By: [name] Signed: 08/25/2020 6:08:34 PM By: [name], [name], RN, CWS, [name], [name] Entered By: [name] on 07/20/2020 14:31:11 Gaccione, [name] ([id number]) -------------------------------------------------------------------------------- Vitals Details Patient Name: IRIE, DOWSON. Date of Service: 07/20/2020 2:15 PM Medical Record Number: [id number] Patient Account Number: [id number] Date of Birth/Sex: January 30, 1946 (74 y.o. F) Treating RN: Cornell Barman Primary Care Eadie Repetto: [name] Other Clinician: Referring Alexianna Nachreiner: [name] Treating Aspynn Clover/Extender: Tito Dine in Treatment: 84 Vital Signs Time Taken: 14:15 Temperature (F): 98.7 Height (in): 63 Pulse (bpm): 85 Weight (lbs): 224.7 Respiratory Rate (breaths/min): 18 Body Mass Index (BMI): 39.8 Blood Pressure (mmHg):  150/79 Reference Range: 80 - 120 mg / dl Electronic Signature(s) Signed: 07/20/2020 4:20:26 PM By: Lorine Bears RCP, RRT, CHT Entered By: Becky Sax, Amado Nash on 07/20/2020 14:16:42

## 2020-08-26 NOTE — Progress Notes (Signed)
Amber, [name] ([id number]) Visit Report for 07/20/2020 HPI Details Patient Name: [name], [name]. Date of Service: 07/20/2020 2:15 PM Medical Record Number: [id number] Patient Account Number: [id number] Date of Birth/Sex: Mar 18, 1946 (74 y.o. F) Treating RN: Cornell Barman Primary Care Provider: [name] Other Clinician: Referring Provider: [name] Treating Provider/Extender: Tito Dine in Treatment: 43 History of Present Illness HPI Description: Pleasant 74 year old with history of chronic venous insufficiency. No diabetes or peripheral vascular disease. Left ABI 1.29. Questionable history of left lower extremity DVT. She developed a recurrent ulceration on her left lateral calf in December 2015, which she attributes to poor diet and subsequent lower extremity edema. She underwent endovenous laser ablation of her left greater saphenous vein in 2010. She underwent laser ablation of accessory branch of left GSV in April 2016 by Dr. Kellie Simmering at [facility]. She was previously wearing Unna boots, which she tolerated well. Tolerating 2 layer compression and cadexomer iodine. She returns to clinic for follow-up and is without new complaints. She denies any significant pain at this time. She reports persistent pain with pressure. No claudication or ischemic rest pain. No fever or chills. No drainage. READMISSION 11/13/16; this is a 74 year old woman who is not a diabetic. She is here for a review of a painful area on her left medial lower extremity. I note that she was seen here previously last year for wound I believe to be in the same area. At that time she had undergone previously a left greater saphenous vein ablation by Dr. Kellie Simmering and she had a ablation of the anterior accessory branch of the left greater saphenous vein in March 2016. Seeing that the wound actually closed over. In reviewing the history with her today the ulcer in this area has been recurrent. She describes  a biopsy of this area in 2009 that only showed stasis physiology. 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 Do not see that she has ever had arterial studies in the left leg. She tells me she has a follow-up with Dr. Kellie Simmering in roughly 10 days In any case she developed the reopening of this area roughly a month ago. On the background of this she describes rapidly increasing edema which has responded to Lasix 40 mg and metolazone 2.5 mg as well as the patient's lymph massage. She has been told she has both venous insufficiency and lymphedema but she cannot tolerate compression stockings 11/28/16; the patient saw Dr. Kellie Simmering recently. Per the patient he did arterial Dopplers in the office that did not show evidence of arterial insufficiency, per the patient he stated "treat this like an ordinary venous ulcer". She also saw her dermatologist Dr. Ronnald Ramp who felt that this was more of a vascular ulcer. In general things are improving although she arrives today with increasing bilateral lower extremity edema with weeping a deeper fluid through the wound on the left medial leg compatible with some degree of lymphedema 12/04/16; the patient's wound is fully epithelialized but I don't think fully healed. We will do another week of depression with Promogran and TCA however I suspect we'll be able to discharge her next week. This is a very unusual-looking wound which was initially a figure-of-eight type wound lying on its side surrounded by petechial like hemorrhage. She  has had venous ablation on this side. She apparently does not have an arterial issue per Dr. Kellie Simmering. She saw her dermatologist thought it was "vascular". Patient is definitely going to need ongoing compression and I talked about  this with her today she will go to elastic therapy after she leaves here next week 12/11/16; the patient's wound is not completely closed today. She has surrounding scar tissue and in further discussion with the patient it would appear that she had ulcers in this area in 2009 for a prolonged period of time ultimately requiring a punch biopsy of this area that only showed venous insufficiency. I did not previously pickup on this part of the history from the patient. 12/18/16; the patient's wound is completely epithelialized. There is no open area here. She has significant bilateral venous insufficiency with secondary lymphedema to a mild-to-moderate degree she does not have compression stockings.. She did not say anything to me when I was in the room, she told our intake nurse that she was still having pain in this area. This isn't unusual recurrent small open area. She is going to go to elastic therapy to obtain compression stockings. 12/25/16; the patient's wound is fully epithelialized. There is no open area here. The patient describes some continued episodic discomfort in this area medial left calf. However everything looks fine and healed here. She is been to elastic therapy and caught herself 15-20 mmHg stockings, they apparently were having trouble getting 20-30 mm stockings in her size 01/22/17; this is a patient we discharged from the clinic a month ago. She has a recurrent open wound on her medial left calf. She had 15 mm support stockings. I told her I thought she needed 20-30 mm compression stockings. She tells me that she has been ill with hospitalization secondary to asthma and is been found to have severe hypokalemia likely secondary to a combination of Lasix and metolazone. This morning she noted blistering and leaking fluid on the posterior part of her left leg. She called our intake nurse urgently and we was saw her this afternoon. She has not had any real discomfort here. I don't know  that she's been wearing any stockings on this leg for at least 2-3 days. ABIs in this clinic were 1.21 on the right and 1.3 on the left. She is previously seen vascular surgery who does not think that there is a peripheral arterial issue. 01/30/17; Patient arrives with no open wound on the left leg. She has been to elastic therapy and obtained 20-14mmhg below knee stockings and she has one on the right leg today. READMISSION 02/19/18; this [name] is a now 74 year old patient we've had in this clinic perhaps 3 times before. I had last looked at her from January 07 December 2016 with an area on the medial left leg. We discharged her on 12/25/16 however she had to be readmitted on 01/22/17 with a recurrence. I have in my notes that we discharged her on 20-30 mm stockings although she tells me she was only wearing support hose because she cannot get stockings on predominantly related to her cervical spine surgery/issues. She has had previous ablations done by vein and vascular in [place] including a great saphenous vein ablation on the left with an anterior accessory branch ablation I think both of these were in 2016. On one of the previous visit she had a biopsy noted 2009 that was negative. She is not felt to have an arterial issue. She is not a diabetic. She does  have a history of obstructive sleep apnea hypertension asthma as well as chronic venous insufficiency and lymphedema. On this occasion she noted 2 dry scaly patch on her left leg. She tried to put lotion on this it didn't really help. There were 2 open areas.the [name], [name] ([id number]) patient has been seeing her primary physician from 02/05/18 through 02/14/18. She had Unna boots applied. The superior wound now on the lateral left leg has closed but she's had one wound that remains open on the lateral left leg. This is not the same spot as we dealt with in 2018. ABIs in this clinic were 1.3 bilaterally 02/26/18; patient has a small wound on  the left lateral calf. Dimensions are down. She has chronic venous insufficiency and lymphedema. 03/05/18; small open area on the left lateral calf. Dimensions are down. Tightly adherent necrotic debris over the surface of the wound which was difficult to remove. Also the dressing [over collagen] stuck to the wound surface. This was removed with some difficulty as well. Change the primary dressing to Hydrofera Blue ready 03/12/18; small open area on the left lateral calf. Comes in with tightly adherent surface eschar as well as some adherent Hydrofera Blue. 03/19/18; open area on the left lateral calf. Again adherent surface eschar as well as some adherent Hydrofera Blue nonviable subcutaneous tissue. She complained of pain all week even with the reduction from 4-3 layer compression I put on last week. Also she had an increase in her ankle and calf measurements probably related to the same thing. 03/26/18; open area on the left lateral calf. A very small open area remains here. We used silver alginate starting last week as the Hydrofera Blue seem to stick to the wound bed. In using 4-layer compression 04/02/18; the open area in the left lateral calf at some adherent slough which I removed there is no open area here. We are able to transition her into her own compression stocking. Truthfully I think this is probably his support hose. However this does not maintain skin integrity will be limited. She cannot put over the toe compression stockings on because of neck problems hand problems etc. She is allergic to the lining layer of juxta lites. We might be forced to use extremitease stocking should this fail READMIT 11/24/2018 Patient is now a 74 year old woman who is not a diabetic. She has been in this clinic on at least 3 previous occasions largely with recurrent wounds on her left leg secondary to chronic venous insufficiency with secondary lymphedema. Her situation is complicated by inability to  get stockings on and an allergy to neoprene which is apparently a component and at least juxta lites and other stockings. As a result she really has not been wearing any stockings on her legs. She tells [place] that roughly 2 or 3 weeks ago she started noticing a stinging sensation just above her ankle on the left medial aspect. She has been diagnosed with pseudogout and she wondered whether this was what she was experiencing. She tried to dress this with something she bought at the store however subsequently it pulled skin off and now she has an open wound that is not improving. She has been using Vaseline gauze with a cover bandage. She saw her primary doctor last week who put an Haematologist on her. ABIs in this clinic was 1.03 on the left 2/12; the area is on the left medial ankle. Odd-looking wound with what looks to be surface epithelialization but a multitude of  small petechial openings. This clearly not closed yet. We have been using silver alginate under 3 layer compression with TCA 2/19; the wound area did not look quite as good this week. Necrotic debris over the majority of the wound surface which required debridement. She continues to have a multitude of what looked to be small petechial openings. She reminds [place] that she had a biopsy on this initially during her first outbreak in 2015 in [place] [facility]. She expresses concern about this being a possible melanoma. 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 I assured her that this does not look anything like melanoma. Except for the petechial reaction it does look like a venous insufficiency area and she certainly has evidence of this on both sides 2/26; a difficult area on the left medial ankle. The patient clearly has chronic venous hypertension with some degree of lymphedema. The odd thing about the area is the small petechial hemorrhages. I am not  really sure how to explain this. This was present last time and this is not a compression injury. We have been using Hydrofera Blue which I changed to last week 3/4; still using Hydrofera Blue. Aggressive debridement today. She does not have known arterial issues. She has seen Dr. Kellie Simmering at [facility] vein and vascular and and has an ablation on the left. [Anterior accessory branch of the greater saphenous]. From what I remember they did not feel she had an arterial issue. The patient has had this area biopsied in 2009 at [facility] dermatology and by her recollection they said this was "stasis". She is also follow-up with dermatology locally who thought that this was more of a vascular issue 3/11; using Hydrofera Blue. Aggressive debridement today. She does not have an arterial issue. We are using 3 layer compression although we may need to go to 4. The patient has been in for multiple changes to her wrap since I last saw her a week ago. She says that the area was leaking. I do not have too much more information on what was found 01/19/19 on evaluation today patient was actually being seen for a nurse visit when unfortunately she had the area on her left lateral lower extremity as well as weeping from the right lower extremity that became apparent. Therefore we did end up actually seeing her for a full visit with myself. She is having some pain at this site as well but fortunately nothing too significant at this point. No fevers, chills, nausea, or vomiting noted at this time. 3/18-Patient is back to the clinic with the left leg venous leg ulcer, the ulcer is larger in size, has a surface that is densely adherent with fibrinous tissue, the Hydrofera Blue was used but is densely adherent and there was difficulty in removing it. The right lower extremity was also wrapped for weeping edema. Patient has a new area over the left lateral foot above the malleolus that is small and appears to have no debris  with intact surrounding skin. Patient is on increased dose of Lasix also as a means to edema management 3/25; the patient has a nonhealing venous ulcer on the medial left leg and last week developed a smaller area on the lateral left calf. We have been using Hydrofera Blue with a contact layer. 4/1; no major change in these wounds areas. Left medial and more recently left lateral calf. I tried Iodoflex last week to aid in debridement  she did not tolerate this. She stated her pain was terrible all week. She took the top layer of the 4 layer compression off. 4/8; the patient actually looks somewhat better in terms of her more prominent left lateral calf wound. There is some healthy looking tissue here. She is still complaining of a lot of discomfort. 4/15; patient in a lot of pain secondary to sciatica. She is on a prednisone taper prescribed by her primary physician. She has the 2 areas one on the left medial and more recently a smaller area on the left lateral calf. Both of these just above the malleoli 4/22; her back pain is better but she still states she is very uncomfortable and now feels she is intolerant to the [employer]. No real change in the wounds we have been using Sorbact. She has been previously intolerant to Iodoflex. There is not a lot of option about what we can use to debride this wound under compression that she no doubt needs. sHe states Ultram no longer works for her pain 4/29; no major change in the wounds slightly increased depth. Surface on the original medial wound perhaps somewhat improved however the more recent area on the lateral left ankle is 100% covered in very adherent debris we have been using Sorbact. She tolerates 4 layer compression well and her edema control is a lot better. She has not had to come in for a nurse check 5/6; no major change in the condition of the wounds. She did consent to debridement today which was done with some difficulty. Continuing Sorbact.  She did not tolerate Iodoflex. She was in for a check of her compression the day after we wrapped her last week this was adjusted but nothing much was found 5/13; no major change in the condition or area of the wounds. I was able to get a fairly aggressive debridement done on the lateral left leg wound. Even using Sorbact under compression. She came back in on Friday to have the wrap changed. She says she felt uncomfortable on the [name], [name]. ([id number]) lateral aspect of her ankle. She has a long history of chronic venous insufficiency including previous ablation surgery on this side. 5/20-Patient returns for wounds on left leg with both wounds covered in slough, with the lateral leg wound larger in size, she has been in 3 layer compression and felt more comfortable, she describes pain in ankle, in leg and pins and needles in foot, and is about to try Pamelor for this 6/3; wounds on the left lateral and left medial leg. The area medially which is the most recent of the 2 seems to have had the largest increase in dimensions. We have been using Sorbac to try and debride the surface. She has been to see orthopedics they apparently did a plain x-ray that was indeterminant. Diagnosed her with neuropathy and they have ordered an MRI to determine if there is underlying osteomyelitis. This was not high on my thought list but I suppose it is prudent. We have advised her to make an appointment with vein and vascular in [place]. She has a history of a left greater saphenous and accessory vein ablations I wonder if there is anything else that can be done from a surgical point of view to help in these difficult refractory wounds. We have previously healed this wound on one occasion but it keeps on reopening [medial side] 6/10; deep tissue culture I did last week I think on the left medial wound showed  both moderate E. coli and moderate staph aureus [MSSA]. She is going to require antibiotics and I have  chosen Augmentin. We have been using Sorbact and we have made better looking wound surface on both sides but certainly no improvement in wound area. She was back in last Friday apparently for a dressing changes the wrap was hurting her outer left ankle. She has not managed to get a hold of vein and vascular in [place]. We are going to have to make her that appointment 6/17; patient is tolerating the Augmentin. She had an MRI that I think was ordered by orthopedic surgeon this did not show osteomyelitis or an abscess did suggest cellulitis. We have been using Sorbact to the lateral and medial ankles. We have been trying to arrange a follow-up appointment with vein and vascular in [place] or did her original ablations. We apparently an area sent the request to vein and vascular in [facility] 6/24; patient has completed the Augmentin. We do not yet have a vein and vascular appointment in [place]. I am not sure what the issue is here we have asked her to call tomorrow. We are using Sorbact. Making some improvements and especially the medial wound. Both surfaces however look better medial and lateral. 7/1; the patient has been in contact with vein and vascular in [place] but has not yet received an appointment. Using Sorbact we have gradually improve the wound surface with no improvement in surface area. She is approved for Apligraf but the wound surface still is not completely viable. She has not had to come in for a dressing change 7/8; the patient has an appointment with vein and vascular on 7/31 which is a Friday afternoon. She is concerned about getting back here for [place] to dress her wounds. I think it is important to have them goal for her venous reflux/history of ablations etc. to see if anything else can be done. She apparently tested positive for 1 of the blood tests with regards to lupus and saw a rheumatologist. He has raised the issue of vasculitis again. I have had this thought in  the past however the evidence seems overwhelming that this is a venous reflux etiology. If the rheumatologist tells me there is clinical and laboratory investigation is positive for lupus I will rethink this. 7/15; the patient's wound surfaces are quite a bit better. The medial area which was her original wound now has no depth although the lateral wound which was the more recent area actually appears larger. Both with viable surfaces which is indeed better. Using Sorbact. I wanted to use Apligraf on her however there is the issue of the vein and vascular appointment on 7/31 at 2:00 in the afternoon which would not allow her to get back to be rewrapped and they would no doubt remove the graft 7/22; the patient's wound surfaces have moderate amount of debris although generally look better. The lateral one is larger with 2 small satellite areas superiorly. We are waiting for her vein and vascular appointment on 7/31. She has been approved for Apligraf which I would like to use after th 7/29; wound surfaces have improved no debridement is required we have been using Sorbact. She sees vein and vascular on Friday with this so question of whether anything can be done to lessen the likelihood of recurrence and/or speed the healing of these areas. She is already had previous ablations. She no doubt has severe venous hypertension 8/5-Patient returns at 1 week, she was in [employer]  for 3 days by her podiatrist, we have been using so backed to the wound, she has increased pain in both the wounds on the left lower leg especially the more distal one on the lateral aspect 8/12-Patient returns at 1 week and she is agreeable to having debridement in both wounds on her left leg today. We have been using Sorbact, and vascular studies were reviewed at last visit 8/19; the patient arrives with her wounds fairly clean and no debridement is required. We have used Sorbact which is really done a nice job in cleaning up  these very difficult wound surfaces. The patient saw Dr. Donzetta Matters of vascular surgery on 7/31. He did not feel that there was an arterial component. He felt that her treated greater saphenous vein is adequately addressed and that the small saphenous vein did not appear to be involved significantly. She was also noted to have deep venous reflux which is not treatable. Dr. Donzetta Matters mentioned the possibility of a central obstructive component leading to reflux and he offered her central venography. She wanted to discuss this or think about it. I have urged her to go ahead with this. She has had recurrent difficult wounds in these areas which do heal but after months in the clinic. If there is anything that can be done to reduce the likelihood of this I think it is worth it. 9/2 she is still working towards getting follow-up with Dr. Donzetta Matters to schedule her CT. Things are quite a bit worse venography. I put Apligraf on 2 weeks ago on both wounds on the medial and lateral part of her left lower leg. She arrives in clinic today with 3 superficial additional wounds above the area laterally and one below the wound medially. She describes a lot of discomfort. I think these are probably wrapped injuries. Does not look like she has cellulitis. 07/20/2019 on evaluation today patient appears to be doing somewhat poorly in regard to her lower extremity ulcers. She in fact showed signs of erythema in fact we may even be dealing with an infection at this time. Unfortunately I am unsure if this is just infection or if indeed there may be some allergic reaction that occurred as a result of the Apligraf application. With that being said that would be unusual but nonetheless not impossible in this patient is one who is unfortunately allergic to quite a bit. Currently we have been using the Sorbact which seems to do as well as anything for her. I do think we may want to obtain a culture today to see if there is anything showing up  there that may need to be addressed. 9/16; noted that last week the wounds look worse in 1 week follow-up of the Apligraf. Using Sorbact as of 2 days ago. She arrives with copious amounts of drainage and new skin breakdown on the back of the left calf. The wounds arm more substantial bilaterally. There is a fair amount of swelling in the left calf no overt DVT there is edema present I think in the left greater than right thigh. She is supposed to go on 9/28 for CT venography. The wounds on the medial and lateral calf are worse and she has new skin breakdown posteriorly at least new for me. This is almost developing into a circumferential wound area The Apligraf was taken off last week which I agree with things are not going in the right direction a culture was done we do not have that back yet. She is  on Augmentin that she started 2 days ago 9/23; dressing was changed by her nurses on Monday. In general there is no improvement in the wound areas although the area looks less angry than last week. She did get Augmentin for MSSA cultured on the 14th. She still appears to have too much swelling in the left leg even with 3 layer compression 9/30; the patient underwent her procedure on 9/28 by Dr. Donzetta Matters at [facility]. She was discovered to have the common iliac vein measuring 12.2 mm but at the level of L4-L5 measured 3 mm. After stenting it measured 10 mm. 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 Rouleaux flow in the common femoral and femoral vein was observed much improved after stenting. We are using silver alginate to the wounds on the medial and lateral ankle on the left. 4 layer compression 10/7; the patient had fluid swelling around her knee and 4 layer compression. At the advice of vein and vascular this was reduced to 3 layer which she is tolerating better. We have been using silver alginate under 3 layer compression since last Friday 10/14; arrives with the  areas on the left ankle looking a lot better. Inflammation in the area also a lot better. She came in for a nurse check on [name], [name] ([id number]) 10/9 10/21; continued nice improvement. Slight improvements in surface area of both the medial and lateral wounds on the left. A lot of the satellite lesions in the weeping erythema around these from stasis dermatitis is resolved. We have been using silver alginate 10/28; general improvement in the entire wound areas although not a lot of change in dimensions the wound certainly looks better. There is a lot less in terms of venous inflammation. Continue silver alginate this week however look towards Hydrofera Blue next week 11/4; very adherent debris on the medial wound left wound is not as bad. We have been using silver alginate. Change to [facility] today 11/11; very adherent debris on both wound areas. She went to vein and vascular last week and follow-up they put in [place] boot on this today. He says the [facility] was adherent. Wound is definitely not as good as last week. Especially on the left there the satellite lesions look more prominent 11/18; absolutely no better. erythema on lateral aspect with tenderness. 09/30/2019 on evaluation today patient appears to actually be doing better. Dr. Dellia Nims did put her on doxycycline last week which I do believe has helped her at this point. Fortunately there is no signs of active infection at this time. No fevers, chills, nausea, vomiting, or diarrhea. I do believe he may want extend the doxycycline for 7 additional days just to ensure everything does completely cleared up the patient is in agreement with that plan. Otherwise she is going require some sharp debridement today 12/2; patient is completing a 2-week course of doxycycline. I gave her this empirically for inflammation as well as infection when I last saw her 2 weeks ago. All of this seems to be better. She is using silver alginate she  has the area on the medial aspect of the larger area laterally and the 2 small satellite regions laterally above the major wound. 12/9; the patient's wound on the left medial and left lateral calf look really quite good. We have been using silver alginate. She saw vein and vascular in follow-up on 10/09/2019. She has had a previous left greater saphenous vein ablation by Dr. Oscar La in 2016.  More recently she underwent a left common iliac vein stent by Dr. 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 The swelling is improved and certainly the wounds have improved. The patient shows [place] today area on the right medial calf there is almost no wound but leaking lymphedema. She says she start this started 3 or 4 days ago. She did not traumatize it. It is not painful. She does not wear compression on that side 12/16; the patient continues to do well laterally. Medially still requiring debridement. The area on the right calf did not materialize to anything and is not currently open. We wrapped this last time. She has support stockings for that leg although I am not sure they are going to provide adequate compression 12/23; the lateral wound looks stable. Medially still requiring debridement for tightly adherent fibrinous debris. We've been using silver alginate. Surface area not any different 12/30; neither wound is any better with regards to surface and the area on the left lateral is larger. I been using silver alginate to the left lateral which look quite good last week and Sorbact to the left medial 11/11/2019. Lateral wound area actually looks better and somewhat smaller. Medial still requires a very aggressive debridement today. We have been using Sorbact on both wound areas 1/13; not much better still adherent debris bilaterally. I been using Sorbact. She has severe venous hypertension. Probably some degree of dermal fibrosis distally. I wonder whether tighter compression might help and I am going  to try that today. We also need to work on the bioburden 1/20; using Sorbact. She has severe venous hypertension status post stent placement for pelvic vein compression. We applied gentamicin last time to see if we could reduce bioburden I had some discussion with her today about the use of pentoxifylline. This is occasionally used in this setting for wounds with refractory venous insufficiency. However this interacts with Plavix. She tells me that she was put on this after stent placement for 3 months. She will call Dr. Claretha Cooper office to discuss 1/27; we are using gentamicin under Sorbact. 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 She has a stent. Wound medially is measuring smaller this week. Laterally measuring slightly larger although she has some satellite lesions superiorly 2/3; gentamicin under Sorbact under 4-layer compression. 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 She has a stent on Plavix. Her wounds are measuring smaller this week. More substantially laterally where there is a satellite lesion superiorly. 2/10; gentamicin under Sorbac. 4-layer compression. Patient communicated with Dr. Donzetta Matters at vein and vascular in [place]. He is okay with the patient coming off Plavix I will therefore start her on pentoxifylline for a 1 month trial. In general her wounds look better today. I had some concerns about swelling in the left thigh however she measures 61.5 on the right and 63 on the mid thigh which does not suggest there is any difficulty. The patient is not describing any pain. 2/17; gentamicin under Sorbac 4-layer compression. She has been on pentoxifylline for 1 week and complains of loose stool. No nausea she is eating and drinking well 2/24; the patient apparently came in 2 days ago for a nurse visit when her wrap fell down. Both areas look a little worse this week macerated medially and satellite lesions laterally. Change to  silver alginate today 3/3; wounds are larger today especially medially. She also has more swelling in her foot lower leg and I even noted some swelling in  her posterior thigh which is tender. I wonder about the patency of her stent. Fortuitously she sees Dr. Claretha Cooper group on Friday 3/10; [name] was seen by vein and vascular on 3/5. The patient underwent ultrasound. There was no evidence of thrombosis involving the IVC no evidence of thrombosis involving the right common iliac vein there is no evidence of thrombosis involving the right external iliac vein the left external vein is also patent. The right common iliac vein stent appears patent bilateral common femoral veins are compressible and appear patent. I was concerned about the left common iliac stent however it looks like this is functional. She has some edema in the posterior thigh that was tender she still has that this week. I also note they had trouble finding the pulses in her left foot and booked her for an ABI baseline in 4 weeks. She will follow up in 6 months for repeat IVC duplex. The patient stopped the pentoxifylline because of diarrhea. It does not look like that was being effective in any case. I have advised her to go back on her aspirin 81 mg tablet, vascular it also suggested this 3/17; comes in today with her wound surfaces a lot better. The excoriations from last week considerably better probably secondary to the TCA. We have been using silver alginate 3/24; comes in today with smaller wounds both medially and laterally. Both required debridement. There are 2 small satellite areas superiorly laterally. She also has a very odd bandlike area in the mid calf almost looking like there was a weakness in the wrap in a localized area. I would write this off as being this however anteriorly she has a small raised ballotable area that is very tender almost reminiscent of an abscess but there was no obvious purulent surface to  it. 02/04/20 upon evaluation today patient appears to be doing fairly well in regard to her wounds today. Fortunately there is no signs of active [name], [name]. ([id number]) infection at this time. No fevers, chills, nausea, vomiting, or diarrhea. She has been tolerating the dressing changes without complication. Fortunately I feel like she is showing signs of improvement although has been sometime since have seen her. Nonetheless the area of concern that Dr. Dellia Nims had last week where she had possibly an area of the wrap that was we can allow the leg to bulge appears to be doing significantly better today there is no signs of anything worsening. 4/7; the patient's wounds on her medial and lateral left leg continue to contract. We have been using a regular alginate. Last week she developed an area on the right medial lower leg which is probably a venous ulcer as well. 4/14; the wounds on her left medial and lateral lower leg continue to contract. Surface eschar. We have been using regular alginate. The area on the right medial lower leg is closed. We have been putting both legs under 4-layer contraction. The patient went back to see vein and vascular she had arterial studies done which were apparently "quite good" per the patient although I have not read their notes I have never felt she had an arterial issue. 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 This is been longstanding and refractory to exercise, leg elevation and longstanding use of compression wraps in our clinic as well as compression stockings on the times we have been able to get these to heal 4/21; we thought she actually might be close this week however she arrives in  clinic with a lot of edema in her upper left calf and into her posterior thigh. This is been an intermittent problem here. She says the wrap fell down but it was replaced with a nurse visit on Monday. We are using calcium  alginate to the wounds and the wound sizes there not terribly larger than last week but there is a lot more edema 4/28; again wound edges are smaller on both sides. Her edema is better controlled than last time. She is obtained her compression pumps from [employer] although they have not been to her home to set these up. 5/5; left medial and left lateral both look stable. I am not sure the medial is any smaller. We have been using calcium alginate under 4-layer compression. oShe had an area on the right medial. This was eschared today. We have been wrapping this as well. She does not tolerate external compression stockings due to a history of various contact allergies. She has her compression pumps however the representative from the company is coming on her to show her how to use these tomorrow 5/19; patient with severe chronic venous insufficiency secondary to central venous disease. She had a stent placed in her left common iliac vein. She has done better since but still difficult to control wounds. She comes in today with nothing open on the right leg. Her areas on the left medial and left lateral are just about closed. We are using calcium alginate under 4-layer compression. She is using her external compression pumps at home She only has 15-20 support stockings. States she cannot get anything tighter than that on. 03/30/20-Patient returns at 1 week, the wounds on the left leg are both slightly bigger, the last week she was on 3 layer compression which started to slide down. She is starting to use her lymphedema pumps although she stated on 1 day her right ankle started to swell up and she have to stop that day. Unfortunately the open area seem to oscillate between improving to the point of healing and then flaring up all to do with effectiveness of compression or lack of due to the left leg topography not keeping the compression wraps from rolling down 6/2 patient comes in with a 15/20  mmHg stocking on the right leg. She tells me that she developed a lot of swelling in her ankles she saw orthopedics she was felt to possibly be having a flare of pseudogout versus some other type of arthritis. She was put on steroids for a respiratory issue so that helps with the inflammation. She has not been using the pumps all week. She thinks the left thigh is more swollen than usual and I would agree with that. She has an appointment with Dr. Donzetta Matters 9 days or so from now 6/9; both wounds on the left medial and left lateral are smaller. We have been using calcium alginate under compression. She does not have an open wound on the right leg she is using a stocking and her compression pumps things are going well. She has an appointment with Dr. Donzetta Matters with regards to her stent in the left common iliac vein 6/16; the wounds on the left medial and left lateral ankle continues to contract. The patient saw Dr. Donzetta Matters and I think he seems satisfied. Ordered follow-up venous reflux studies on both sides in September. Cautioned that she may need thigh-high stockings. She has been using calcium alginate under compression on the left and her own stocking on the right leg.  She tells [place] there are no open wounds on the right 6/23; left lateral is just about closed. Medial required debridement today. We have been using calcium alginate. Extensive discussion about the compression pumps she is only using these on 25 mmHg states she could not take 40 or 30 when the wrap came out to her home to demonstrate these. He said they should not feel tight 6/30; the left lateral wound has a slight amount of eschar. . The area medially is about the same using Hydrofera Blue. 7/7; left lateral wound still has some eschar. I will remove this next week may be closed. The area medially is very small using Hydrofera Blue with improvement. Unfortunately the stockings fell down. Unfortunately the blisters have developed at the edge of  where the wrap fell. When this happened she says her legs hurt she did not use her pumps. We are not open Monday for her to come in and change the wraps and she had an appointment yesterday. She also tells me that she is going to have an MRI of her back. She is having pain radiating into her left anterior leg she thinks her from an L5 disc. She saw Dr. Ellene Route of neurosurgery 7/14; the area on the left lateral ankle area is closed. Still a small area medially however it looks better as well. We have been using Hydrofera Blue under 4-layer compression 7/21; left lateral ankle is still closed however her wound on the medial left calf is actually larger. This is probably because Hydrofera Blue got stuck to the wound. She came in for a nurse change on Friday and will do that again this week I was concerned about the amount of swelling that she had last week however she is using her compression pumps twice a day and the swelling seems well controlled 7/28; remaining wound on the left medial lower leg is smaller. We have been using moistened silver collagen under compression she is coming back for a nurse visit. For reasons that were not really clear she was just keeping her legs elevated and not using her compression pumps. I have asked her to use the compression pumps. She does not have any wounds on the right leg 06/15/20-Patient returns at 2 weeks, her LLE edema is worse and she developed a blister wound that is new and has bigger posterior calf wound on right, we are using Prisma with pad, 4 layer compression. she has been on lasix 40 mg daily 8/18; patient arrives today with things a lot worse than I remember from a few weeks ago. She was seen last week. Noted that her edema was worse and that she now had a left lateral wound as well as deteriorating edema in the medial and posterior part of the lower leg. She says she is using his or her external compression pumps once a day although I wonder about  the compliance. 8/25; weeping area on the right medial lower leg. This had actually gotten a small localized area of her compression stocking wet. oOn the left side there is a large denuded area on the posterior medial lower leg and smaller area on the lateral. This was not the original areas that we dealt with. 9/1 the patient's wound on the left leg include the left lateral and left posterior. Larger superficial wounds weeping. She has very poor edema control. Tender localized edema in the left lower medial ankle/heel probably because of localized wrap issues. She freely admits she is not using the  compression pumps. She has been up on her feet a lot. She thinks the hydrofera blue is contributing to the pain she is experiencing.. This is a complaint that I have occasionally heard 9/8; really not much improvement. The patient is still complaining of a lot of pain particularly when she uses compression pumps. I switched her [name], [name] ([id number]) to silver alginate last time because she found the Hydrofera Blue to be irritating. I don't hear much difference in her description with the silver alginate. 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 She has follow-up with Dr. Donzetta Matters I think for ultrasound next month 9/15; quite a bit of improvement today. We have less edema and more epithelialization in both of her wound areas on the left medial and left lateral calf. These are not the site of her original wounds in this area. She says she has been using her compression pumps for 30 minutes twice a day, there pain issues that never quite understood. Silver alginate as the primary dressing Electronic Signature(s) Signed: 07/21/2020 4:37:14 PM By: Linton Ham MD Entered By: Linton Ham on 07/20/2020 15:00:15 [name]  ([id number]) -------------------------------------------------------------------------------- Physical Exam Details Patient Name: HANSIKA, LEAMING. Date of Service: 07/20/2020 2:15 PM Medical Record Number: [id number] Patient Account Number: [id number] Date of Birth/Sex: Apr 02, 1946 (74 y.o. F) Treating RN: Cornell Barman Primary Care Provider: [name] Other Clinician: Referring Provider: [name] Treating Provider/Extender: Tito Dine in Treatment: 11 Constitutional Patient is hypertensive.. Neck Neck supple and symmetrical. No masses or crepitus. Respiratory Respiratory effort is easy and symmetric bilaterally. Rate is normal at rest and on room air.. Cardiovascular Pedal pulses palpable on the left. Her edema control actually looks quite good even into the upper thigh. She is concerned about some soft but feels like dermal lipomas. I do not think these represent anything in the venous system. Integumentary (Hair, Skin) There is some localized erythema on the medial heel and ankle but I do not believe this is cellulitis. Notes Wound exam; an improvement from last week. The wounds are epithelializing. Superficial. No debridement is necessary. The erythema that she had last time is considerably better wound surfaces are better and swelling is better controlled Electronic Signature(s) Signed: 07/21/2020 4:37:14 PM By: Linton Ham MD Entered By: Linton Ham on 07/20/2020 15:09:41 Eyerman, [name] ([id number]) -------------------------------------------------------------------------------- Physician Orders Details Patient Name: LINZEY, RAMSER. Date of Service: 07/20/2020 2:15 PM Medical Record Number: [id number] Patient Account Number: [id number] Date of Birth/Sex: 06/25/1946 (74 y.o. F) Treating RN: Cornell Barman Primary Care Provider: [name] Other Clinician: Referring Provider: [name] Treating Provider/Extender: Tito Dine  in Treatment: 23 Verbal / Phone Orders: No Diagnosis Coding Wound Cleansing Wound #12 Left,Lateral Lower Leg o Dial antibacterial soap, wash wounds, rinse and pat dry prior to dressing wounds Wound #5 Left,Medial Lower Leg o Dial antibacterial soap, wash wounds, rinse and pat dry prior to dressing wounds Anesthetic (add to Medication List) Wound #12 Left,Lateral Lower Leg o Topical Lidocaine 4% cream applied to wound bed prior to debridement (In Clinic Only). - if needed. Wound #5 Left,Medial Lower Leg o Topical Lidocaine 4% cream applied to wound bed prior to debridement (In Clinic Only). - if needed. Skin Barriers/Peri-Wound Care Wound #12 Left,Lateral Lower Leg o Triamcinolone Acetonide Ointment (TCA) Wound #5 Left,Medial Lower Leg o Triamcinolone Acetonide Ointment (TCA) Primary Wound Dressing Wound #12 Left,Lateral Lower Leg   o Silver Alginate Wound #5 Left,Medial Lower Leg o Silver Alginate Secondary Dressing Wound #12 Left,Lateral Lower Leg o ABD pad Wound #5 Left,Medial Lower Leg o ABD pad Dressing Change Frequency Wound #12 Left,Lateral Lower Leg o Change dressing every week o Other: - Nurse visit Friday and Monday if needed. Wound #5 Left,Medial Lower Leg o Change dressing every week o Other: - Nurse visit Friday and Monday if needed. Follow-up Appointments Wound #12 Left,Lateral Lower Leg o Return Appointment in 1 week. o Nurse Visit as needed Wound #5 Left,Medial Lower Leg o Return Appointment in 1 week. o Nurse Visit as needed Edema Control JENENE, KAUFFMANN ([id number]) Wound #12 Left,Lateral Lower Leg o 4-Layer Compression System - Left Lower Extremity. o Patient to wear own compression stockings - Right leg o Compression Pump: Use compression pump on left lower extremity for 60 minutes, twice daily. - at least 45 minutes o Compression Pump: Use compression pump on right lower extremity for 60 minutes, twice  daily. - at least 45 minutes Wound #5 Left,Medial Lower Leg o 4-Layer Compression System - Left Lower Extremity. o Patient to wear own compression stockings - Right leg o Compression Pump: Use compression pump on left lower extremity for 60 minutes, twice daily. - at least 45 minutes o Compression Pump: Use compression pump on right lower extremity for 60 minutes, twice daily. - at least 45 minutes Electronic Signature(s) Signed: 07/21/2020 4:37:14 PM By: Linton Ham MD Signed: 08/25/2020 6:08:34 PM By: [name], [name], RN, CWS, [name], [name] Entered By: [name], [name], RN, CWS, Kim on 07/20/2020 14:54:55 Montine, Hight [name] ([id number]) -------------------------------------------------------------------------------- Problem List Details Patient Name: KELLYN, MCCARY. Date of Service: 07/20/2020 2:15 PM Medical Record Number: [id number] Patient Account Number: [id number] Date of Birth/Sex: Feb 02, 1946 (74 y.o. F) Treating RN: Cornell Barman Primary Care Provider: [name] Other Clinician: Referring Provider: [name] Treating Provider/Extender: Tito Dine in Treatment: 74 Active Problems ICD-10 Encounter Code Description Active Date MDM Diagnosis L97.221 Non-pressure chronic ulcer of left calf limited to breakdown of skin 01/07/2019 No Yes I89.0 Lymphedema, not elsewhere classified 12/10/2018 No Yes I87.332 Chronic venous hypertension (idiopathic) with ulcer and inflammation of 12/09/2019 No Yes left lower extremity I83.009 Varicose veins of unspecified lower extremity with ulcer of unspecified 04/13/2020 No Yes site L97.818 Non-pressure chronic ulcer of other part of right lower leg with other 10/14/2019 No Yes specified severity I82.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 Date of Service: 07/20/2020 2:15 PM Medical Record Number: [id number] Patient Account Number: [id number] Date of Birth/Sex: [birth year] (74 y.o. F) Treating RN: Cornell Barman Primary Care Provider: [name] Other Clinician: Referring Provider: [name] Treating Provider/Extender: Tito Dine in Treatment: 69 Subjective History of Present Illness (HPI) Pleasant 74 year old with history of chronic venous insufficiency. No diabetes or peripheral vascular disease. Left ABI 1.29. Questionable history of left lower extremity DVT. She developed a recurrent ulceration on her left lateral calf in December 2015, which she attributes to poor diet and subsequent lower extremity edema. She underwent endovenous laser ablation of her left greater saphenous vein in 2010. She underwent laser ablation  of accessory branch of left GSV in April 2016 by Dr. Kellie Simmering at [facility]. She was previously wearing Unna boots, which she tolerated well. Tolerating 2 layer compression and cadexomer iodine. She returns to clinic for follow-up and is without new complaints. She denies any significant pain at this time. She reports persistent pain with pressure. No claudication or ischemic rest pain. No fever or chills. No drainage. READMISSION 11/13/16; this is a 74 year old woman who is not a diabetic. She is here for a review of a painful area on her left medial lower extremity. I note that she was seen here previously last year for wound I believe to be in the same area. At that time she had undergone previously a left greater saphenous vein ablation by Dr. Kellie Simmering  and she had a ablation of the anterior accessory branch of the left greater saphenous vein in March 2016. Seeing that the wound actually closed over. In reviewing the history with her today the ulcer in this area has been recurrent. She describes a biopsy of this area in 2009 that only showed stasis physiology. 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 Do not see that she has ever had arterial studies in the left leg. She tells me she has a follow-up with Dr. Kellie Simmering in roughly 10 days In any case she developed the reopening of this area roughly a month ago. On the background of this she describes rapidly increasing edema which has responded to Lasix 40 mg and metolazone 2.5 mg as well as the patient's lymph massage. She has been told she has both venous insufficiency and lymphedema but she cannot tolerate compression stockings 11/28/16; the patient saw Dr. Kellie Simmering recently. Per the patient he did arterial Dopplers in the office that did not show evidence of arterial insufficiency, per the patient he stated "treat this like an ordinary venous ulcer". She also saw her dermatologist Dr. Ronnald Ramp who felt that this was more of a vascular ulcer. In general things are improving although she arrives today with increasing bilateral lower extremity edema with weeping a deeper fluid through the wound on the left medial leg compatible with some degree of lymphedema 12/04/16; the patient's wound is fully epithelialized but I don't think fully healed. We will do another week of depression with Promogran and TCA however I suspect we'll be able to discharge her next week. This is a very unusual-looking wound which was initially a figure-of-eight type wound lying on its side surrounded by petechial like  hemorrhage. She has had venous ablation on this side. She apparently does not have an arterial issue per Dr. Kellie Simmering. She saw her dermatologist thought it was "vascular". Patient is definitely going to need ongoing compression and I talked about this with her today she will go to elastic therapy after she leaves here next week 12/11/16; the patient's wound is not completely closed today. She has surrounding scar tissue and in further discussion with the patient it would appear that she had ulcers in this area in 2009 for a prolonged period of time ultimately requiring a punch biopsy of this area that only showed venous insufficiency. I did not previously pickup on this part of the history from the patient. 12/18/16; the patient's wound is completely epithelialized. There is no  open area here. She has significant bilateral venous insufficiency with secondary lymphedema to a mild-to-moderate degree she does not have compression stockings.. She did not say anything to me when I was in the room, she told our intake nurse that she was still having pain in this area. This isn't unusual recurrent small open area. She is going to go to elastic therapy to obtain compression stockings. 12/25/16; the patient's wound is fully epithelialized. There is no open area here. The patient describes some continued episodic discomfort in this area medial left calf. However everything looks fine and healed here. She is been to elastic therapy and caught herself 15-20 mmHg stockings, they apparently were having trouble getting 20-30 mm stockings in her size 01/22/17; this is a patient we discharged from the clinic a month ago. She has a recurrent open wound on her medial left calf. She had 15 mm support stockings. I told her I thought she needed 20-30 mm compression stockings. She tells me that she has been ill with hospitalization secondary to asthma and is been found to have severe hypokalemia likely secondary to a combination  of Lasix and metolazone. This morning she noted blistering and leaking fluid on the posterior part of her left leg. She called our intake nurse urgently and we was saw her this afternoon. She has not had any real discomfort here. I don't know that she's been wearing any stockings on this leg for at least 2-3 days. ABIs in this clinic were 1.21 on the right and 1.3 on the left. She is previously seen vascular surgery who does not think that there is a peripheral arterial issue. 01/30/17; Patient arrives with no open wound on the left leg. She has been to elastic therapy and obtained 20-90mmhg below knee stockings and she has one on the right leg today. READMISSION 02/19/18; this Stach is a now 74 year old patient we've had in this clinic perhaps 3 times before. I had last looked at her from January 07 December 2016 with an area on the medial left leg. We discharged her on 12/25/16 however she had to be readmitted on 01/22/17 with a recurrence. I have in my notes that we discharged her on 20-30 mm stockings although she tells me she was only wearing support hose because she cannot get stockings on predominantly related to her cervical spine surgery/issues. She has had previous ablations done by vein and vascular in [place] including a great saphenous vein ablation on the left with an anterior accessory branch ablation I think both of these were in 2016. On one of the previous visit she had a biopsy noted 2009 that was negative. She is not felt to have an arterial issue. She is not a diabetic. She does have a history of obstructive sleep apnea hypertension asthma as well as chronic venous insufficiency and lymphedema. On this occasion she noted 2 dry scaly patch on her left leg. She tried to put lotion on this it didn't really help. There were 2 open areas.the patient has been seeing her primary physician from 02/05/18 through 02/14/18. She had Unna boots applied. The superior wound now on the lateral left  leg has closed but she's had one wound that remains open on the lateral left leg. This is not the same spot as we dealt with in 2018. ABIs in this clinic were 1.3 bilaterally HEAVIN, [name] ([id number]) 02/26/18; patient has a small wound on the left lateral calf. Dimensions are down. She has chronic venous insufficiency and lymphedema.  03/05/18; small open area on the left lateral calf. Dimensions are down. Tightly adherent necrotic debris over the surface of the wound which was difficult to remove. Also the dressing [over collagen] stuck to the wound surface. This was removed with some difficulty as well. Change the primary dressing to Hydrofera Blue ready 03/12/18; small open area on the left lateral calf. Comes in with tightly adherent surface eschar as well as some adherent Hydrofera Blue. 03/19/18; open area on the left lateral calf. Again adherent surface eschar as well as some adherent Hydrofera Blue nonviable subcutaneous tissue. She complained of pain all week even with the reduction from 4-3 layer compression I put on last week. Also she had an increase in her ankle and calf measurements probably related to the same thing. 03/26/18; open area on the left lateral calf. A very small open area remains here. We used silver alginate starting last week as the Hydrofera Blue seem to stick to the wound bed. In using 4-layer compression 04/02/18; the open area in the left lateral calf at some adherent slough which I removed there is no open area here. We are able to transition her into her own compression stocking. Truthfully I think this is probably his support hose. However this does not maintain skin integrity will be limited. She cannot put over the toe compression stockings on because of neck problems hand problems etc. She is allergic to the lining layer of juxta lites. We might be forced to use extremitease stocking should this fail READMIT 11/24/2018 Patient is now a 74 year old woman who is not  a diabetic. She has been in this clinic on at least 3 previous occasions largely with recurrent wounds on her left leg secondary to chronic venous insufficiency with secondary lymphedema. Her situation is complicated by inability to get stockings on and an allergy to neoprene which is apparently a component and at least juxta lites and other stockings. As a result she really has not been wearing any stockings on her legs. She tells [place] that roughly 2 or 3 weeks ago she started noticing a stinging sensation just above her ankle on the left medial aspect. She has been diagnosed with pseudogout and she wondered whether this was what she was experiencing. She tried to dress this with something she bought at the store however subsequently it pulled skin off and now she has an open wound that is not improving. She has been using Vaseline gauze with a cover bandage. She saw her primary doctor last week who put an Haematologist on her. ABIs in this clinic was 1.03 on the left 2/12; the area is on the left medial ankle. Odd-looking wound with what looks to be surface epithelialization but a multitude of small petechial openings. This clearly not closed yet. We have been using silver alginate under 3 layer compression with TCA 2/19; the wound area did not look quite as good this week. Necrotic debris over the majority of the wound surface which required debridement. She continues to have a multitude of what looked to be small petechial openings. She reminds [place] that she had a biopsy on this initially during her first outbreak in 2015 in [place] [facility]. She expresses concern about this being a possible melanoma. 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 I assured her that this does not look anything like melanoma. Except for the petechial reaction it does look like a venous insufficiency  area and she certainly has  evidence of this on both sides 2/26; a difficult area on the left medial ankle. The patient clearly has chronic venous hypertension with some degree of lymphedema. The odd thing about the area is the small petechial hemorrhages. I am not really sure how to explain this. This was present last time and this is not a compression injury. We have been using Hydrofera Blue which I changed to last week 3/4; still using Hydrofera Blue. Aggressive debridement today. She does not have known arterial issues. She has seen Dr. Kellie Simmering at [facility] vascular and and has an ablation on the left. [Anterior accessory branch of the greater saphenous]. From what I remember they did not feel she had an arterial issue. The patient has had this area biopsied in 2009 at [facility] dermatology and by her recollection they said this was "stasis". She is also follow-up with dermatology locally who thought that this was more of a vascular issue 3/11; using Hydrofera Blue. Aggressive debridement today. She does not have an arterial issue. We are using 3 layer compression although we may need to go to 4. The patient has been in for multiple changes to her wrap since I last saw her a week ago. She says that the area was leaking. I do not have too much more information on what was found 01/19/19 on evaluation today patient was actually being seen for a nurse visit when unfortunately she had the area on her left lateral lower extremity as well as weeping from the right lower extremity that became apparent. Therefore we did end up actually seeing her for a full visit with myself. She is having some pain at this site as well but fortunately nothing too significant at this point. No fevers, chills, nausea, or vomiting noted at this time. 3/18-Patient is back to the clinic with the left leg venous leg ulcer, the ulcer is larger in size, has a surface that is densely adherent with fibrinous tissue, the Hydrofera Blue was used but  is densely adherent and there was difficulty in removing it. The right lower extremity was also wrapped for weeping edema. Patient has a new area over the left lateral foot above the malleolus that is small and appears to have no debris with intact surrounding skin. Patient is on increased dose of Lasix also as a means to edema management 3/25; the patient has a nonhealing venous ulcer on the medial left leg and last week developed a smaller area on the lateral left calf. We have been using Hydrofera Blue with a contact layer. 4/1; no major change in these wounds areas. Left medial and more recently left lateral calf. I tried Iodoflex last week to aid in debridement she did not tolerate this. She stated her pain was terrible all week. She took the top layer of the 4 layer compression off. 4/8; the patient actually looks somewhat better in terms of her more prominent left lateral calf wound. There is some healthy looking tissue here. She is still complaining of a lot of discomfort. 4/15; patient in a lot of pain secondary to sciatica. She is on a prednisone taper prescribed by her primary physician. She has the 2 areas one on the left medial and more recently a smaller area on the left lateral calf. Both of these just above the malleoli 4/22; her back pain is better but she still states she is very uncomfortable and now feels she is intolerant to the [employer]. No  real change in the wounds we have been using Sorbact. She has been previously intolerant to Iodoflex. There is not a lot of option about what we can use to debride this wound under compression that she no doubt needs. sHe states Ultram no longer works for her pain 4/29; no major change in the wounds slightly increased depth. Surface on the original medial wound perhaps somewhat improved however the more recent area on the lateral left ankle is 100% covered in very adherent debris we have been using Sorbact. She tolerates 4 layer  compression well and her edema control is a lot better. She has not had to come in for a nurse check 5/6; no major change in the condition of the wounds. She did consent to debridement today which was done with some difficulty. Continuing Sorbact. She did not tolerate Iodoflex. She was in for a check of her compression the day after we wrapped her last week this was adjusted but nothing much was found 5/13; no major change in the condition or area of the wounds. I was able to get a fairly aggressive debridement done on the lateral left leg wound. Even using Sorbact under compression. She came back in on Friday to have the wrap changed. She says she felt uncomfortable on the lateral aspect of her ankle. She has a long history of chronic venous insufficiency including previous ablation surgery on this side. 5/20-Patient returns for wounds on left leg with both wounds covered in slough, with the lateral leg wound larger in size, she has been in 3 layer compression and felt more comfortable, she describes pain in ankle, in leg and pins and needles in foot, and is about to try Pamelor for this 6/3; wounds on the left lateral and left medial leg. The area medially which is the most recent of the 2 seems to have had the largest increase in [place], ALEKSIS [name]. ([id number]) dimensions. We have been using Sorbac to try and debride the surface. She has been to see orthopedics they apparently did a plain x-ray that was indeterminant. Diagnosed her with neuropathy and they have ordered an MRI to determine if there is underlying osteomyelitis. This was not high on my thought list but I suppose it is prudent. We have advised her to make an appointment with vein and vascular in [place]. She has a history of a left greater saphenous and accessory vein ablations I wonder if there is anything else that can be done from a surgical point of view to help in these difficult refractory wounds. We have previously healed this  wound on one occasion but it keeps on reopening [medial side] 6/10; deep tissue culture I did last week I think on the left medial wound showed both moderate E. coli and moderate staph aureus [MSSA]. She is going to require antibiotics and I have chosen Augmentin. We have been using Sorbact and we have made better looking wound surface on both sides but certainly no improvement in wound area. She was back in last Friday apparently for a dressing changes the wrap was hurting her outer left ankle. She has not managed to get a hold of vein and vascular in [place]. We are going to have to make her that appointment 6/17; patient is tolerating the Augmentin. She had an MRI that I think was ordered by orthopedic surgeon this did not show osteomyelitis or an abscess did suggest cellulitis. We have been using Sorbact to the lateral and medial ankles. We have been trying  to arrange a follow-up appointment with vein and vascular in [place] or did her original ablations. We apparently an area sent the request to vein and vascular in [facility] 6/24; patient has completed the Augmentin. We do not yet have a vein and vascular appointment in [place]. I am not sure what the issue is here we have asked her to call tomorrow. We are using Sorbact. Making some improvements and especially the medial wound. Both surfaces however look better medial and lateral. 7/1; the patient has been in contact with vein and vascular in [place] but has not yet received an appointment. Using Sorbact we have gradually improve the wound surface with no improvement in surface area. She is approved for Apligraf but the wound surface still is not completely viable. She has not had to come in for a dressing change 7/8; the patient has an appointment with vein and vascular on 7/31 which is a Friday afternoon. She is concerned about getting back here for [place] to dress her wounds. I think it is important to have them goal for her venous  reflux/history of ablations etc. to see if anything else can be done. She apparently tested positive for 1 of the blood tests with regards to lupus and saw a rheumatologist. He has raised the issue of vasculitis again. I have had this thought in the past however the evidence seems overwhelming that this is a venous reflux etiology. If the rheumatologist tells me there is clinical and laboratory investigation is positive for lupus I will rethink this. 7/15; the patient's wound surfaces are quite a bit better. The medial area which was her original wound now has no depth although the lateral wound which was the more recent area actually appears larger. Both with viable surfaces which is indeed better. Using Sorbact. I wanted to use Apligraf on her however there is the issue of the vein and vascular appointment on 7/31 at 2:00 in the afternoon which would not allow her to get back to be rewrapped and they would no doubt remove the graft 7/22; the patient's wound surfaces have moderate amount of debris although generally look better. The lateral one is larger with 2 small satellite areas superiorly. We are waiting for her vein and vascular appointment on 7/31. She has been approved for Apligraf which I would like to use after th 7/29; wound surfaces have improved no debridement is required we have been using Sorbact. She sees vein and vascular on Friday with this so question of whether anything can be done to lessen the likelihood of recurrence and/or speed the healing of these areas. She is already had previous ablations. She no doubt has severe venous hypertension 8/5-Patient returns at 1 week, she was in [place] for 3 days by her podiatrist, we have been using so backed to the wound, she has increased pain in both the wounds on the left lower leg especially the more distal one on the lateral aspect 8/12-Patient returns at 1 week and she is agreeable to having debridement in both wounds on her left  leg today. We have been using Sorbact, and vascular studies were reviewed at last visit 8/19; the patient arrives with her wounds fairly clean and no debridement is required. We have used Sorbact which is really done a nice job in cleaning up these very difficult wound surfaces. The patient saw Dr. Donzetta Matters of vascular surgery on 7/31. He did not feel that there was an arterial component. He felt that her treated greater saphenous  vein is adequately addressed and that the small saphenous vein did not appear to be involved significantly. She was also noted to have deep venous reflux which is not treatable. Dr. Donzetta Matters mentioned the possibility of a central obstructive component leading to reflux and he offered her central venography. She wanted to discuss this or think about it. I have urged her to go ahead with this. She has had recurrent difficult wounds in these areas which do heal but after months in the clinic. If there is anything that can be done to reduce the likelihood of this I think it is worth it. 9/2 she is still working towards getting follow-up with Dr. Donzetta Matters to schedule her CT. Things are quite a bit worse venography. I put Apligraf on 2 weeks ago on both wounds on the medial and lateral part of her left lower leg. She arrives in clinic today with 3 superficial additional wounds above the area laterally and one below the wound medially. She describes a lot of discomfort. I think these are probably wrapped injuries. Does not look like she has cellulitis. 07/20/2019 on evaluation today patient appears to be doing somewhat poorly in regard to her lower extremity ulcers. She in fact showed signs of erythema in fact we may even be dealing with an infection at this time. Unfortunately I am unsure if this is just infection or if indeed there may be some allergic reaction that occurred as a result of the Apligraf application. With that being said that would be unusual but nonetheless not impossible in  this patient is one who is unfortunately allergic to quite a bit. Currently we have been using the Sorbact which seems to do as well as anything for her. I do think we may want to obtain a culture today to see if there is anything showing up there that may need to be addressed. 9/16; noted that last week the wounds look worse in 1 week follow-up of the Apligraf. Using Sorbact as of 2 days ago. She arrives with copious amounts of drainage and new skin breakdown on the back of the left calf. The wounds arm more substantial bilaterally. There is a fair amount of swelling in the left calf no overt DVT there is edema present I think in the left greater than right thigh. She is supposed to go on 9/28 for CT venography. The wounds on the medial and lateral calf are worse and she has new skin breakdown posteriorly at least new for me. This is almost developing into a circumferential wound area The Apligraf was taken off last week which I agree with things are not going in the right direction a culture was done we do not have that back yet. She is on Augmentin that she started 2 days ago 9/23; dressing was changed by her nurses on Monday. In general there is no improvement in the wound areas although the area looks less angry than last week. She did get Augmentin for MSSA cultured on the 14th. She still appears to have too much swelling in the left leg even with 3 layer compression 9/30; the patient underwent her procedure on 9/28 by Dr. Donzetta [facility]ters at [facility]. She was discovered to have the common iliac vein measuring 12.2 mm but at the level of L4-L5 measured 3 mm. After stenting it measured 10 mm. 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 Rouleaux flow in the common femoral and femoral vein was observed much improved after stenting.  We are using silver alginate to the wounds on the medial and lateral ankle on the left. 4 layer compression 10/7; the patient had fluid swelling  around her knee and 4 layer compression. At the advice of vein and vascular this was reduced to 3 layer which she is tolerating better. We have been using silver alginate under 3 layer compression since last Friday 10/14; arrives with the areas on the left ankle looking a lot better. Inflammation in the area also a lot better. She came in for a nurse check on 10/9 10/21; continued nice improvement. Slight improvements in surface area of both the medial and lateral wounds on the left. A lot of the satellite lesions in the weeping erythema around these from stasis dermatitis is resolved. We have been using silver alginate [name], [name] ([id number]) 10/28; general improvement in the entire wound areas although not a lot of change in dimensions the wound certainly looks better. There is a lot less in terms of venous inflammation. Continue silver alginate this week however look towards Hydrofera Blue next week 11/4; very adherent debris on the medial wound left wound is not as bad. We have been using silver alginate. Change to [facility] today 11/11; very adherent debris on both wound areas. She went to vein and vascular last week and follow-up they put in [place] boot on this today. He says the [facility] was adherent. Wound is definitely not as good as last week. Especially on the left there the satellite lesions look more prominent 11/18; absolutely no better. erythema on lateral aspect with tenderness. 09/30/2019 on evaluation today patient appears to actually be doing better. Dr. Dellia Nims did put her on doxycycline last week which I do believe has helped her at this point. Fortunately there is no signs of active infection at this time. No fevers, chills, nausea, vomiting, or diarrhea. I do believe he may want extend the doxycycline for 7 additional days just to ensure everything does completely cleared up the patient is in agreement with that plan. Otherwise she is going require some sharp  debridement today 12/2; patient is completing a 2-week course of doxycycline. I gave her this empirically for inflammation as well as infection when I last saw her 2 weeks ago. All of this seems to be better. She is using silver alginate she has the area on the medial aspect of the larger area laterally and the 2 small satellite regions laterally above the major wound. 12/9; the patient's wound on the left medial and left lateral calf look really quite good. We have been using silver alginate. She saw vein and vascular in follow-up on 10/09/2019. She has had a previous left greater saphenous vein ablation by Dr. Oscar La in 2016. More recently she underwent a left common iliac vein stent by Dr. 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 The swelling is improved and certainly the wounds have improved. The patient shows [place] today area on the right medial calf there is almost no wound but leaking lymphedema. She says she start this started 3 or 4 days ago. She did not traumatize it. It is not painful. She does not wear compression on that side 12/16; the patient continues to do well laterally. Medially still requiring debridement. The area on the right calf did not materialize to anything and is not currently open. We wrapped this last time. She has support stockings for that leg although I am not sure they are going to provide adequate compression 12/23;  the lateral wound looks stable. Medially still requiring debridement for tightly adherent fibrinous debris. We've been using silver alginate. Surface area not any different 12/30; neither wound is any better with regards to surface and the area on the left lateral is larger. I been using silver alginate to the left lateral which look quite good last week and Sorbact to the left medial 11/11/2019. Lateral wound area actually looks better and somewhat smaller. Medial still requires a very aggressive debridement today. We have been using Sorbact on  both wound areas 1/13; not much better still adherent debris bilaterally. I been using Sorbact. She has severe venous hypertension. Probably some degree of dermal fibrosis distally. I wonder whether tighter compression might help and I am going to try that today. We also need to work on the bioburden 1/20; using Sorbact. She has severe venous hypertension status post stent placement for pelvic vein compression. We applied gentamicin last time to see if we could reduce bioburden I had some discussion with her today about the use of pentoxifylline. This is occasionally used in this setting for wounds with refractory venous insufficiency. However this interacts with Plavix. She tells me that she was put on this after stent placement for 3 months. She will call Dr. Claretha Cooper office to discuss 1/27; we are using gentamicin under Sorbact. 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 She has a stent. Wound medially is measuring smaller this week. Laterally measuring slightly larger although she has some satellite lesions superiorly 2/3; gentamicin under Sorbact under 4-layer compression. 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 She has a stent on Plavix. Her wounds are measuring smaller this week. More substantially laterally where there is a satellite lesion superiorly. 2/10; gentamicin under Sorbac. 4-layer compression. Patient communicated with Dr. Donzetta Matters at vein and vascular in [place]. He is okay with the patient coming off Plavix I will therefore start her on pentoxifylline for a 1 month trial. In general her wounds look better today. I had some concerns about swelling in the left thigh however she measures 61.5 on the right and 63 on the mid thigh which does not suggest there is any difficulty. The patient is not describing any pain. 2/17; gentamicin under Sorbac 4-layer compression. She has been on pentoxifylline for 1 week and complains of loose  stool. No nausea she is eating and drinking well 2/24; the patient apparently came in 2 days ago for a nurse visit when her wrap fell down. Both areas look a little worse this week macerated medially and satellite lesions laterally. Change to silver alginate today 3/3; wounds are larger today especially medially. She also has more swelling in her foot lower leg and I even noted some swelling in her posterior thigh which is tender. I wonder about the patency of her stent. Fortuitously she sees Dr. Claretha Cooper group on Friday 3/10; Mrs. Burditt was seen by vein and vascular on 3/5. The patient underwent ultrasound. There was no evidence of thrombosis involving the IVC no evidence of thrombosis involving the right common iliac vein there is no evidence of thrombosis involving the right external iliac vein the left external vein is also patent. The right common iliac vein stent appears patent bilateral common femoral veins are compressible and appear patent. I was concerned about the left common iliac stent however it looks like this is functional. She has some edema in the posterior thigh that was tender she still has that this week. I also note they had trouble  finding the pulses in her left foot and booked her for an ABI baseline in 4 weeks. She will follow up in 6 months for repeat IVC duplex. The patient stopped the pentoxifylline because of diarrhea. It does not look like that was being effective in any case. I have advised her to go back on her aspirin 81 mg tablet, vascular it also suggested this 3/17; comes in today with her wound surfaces a lot better. The excoriations from last week considerably better probably secondary to the TCA. We have been using silver alginate 3/24; comes in today with smaller wounds both medially and laterally. Both required debridement. There are 2 small satellite areas superiorly laterally. She also has a very odd bandlike area in the mid calf almost looking like there  was a weakness in the wrap in a localized area. I would write this off as being this however anteriorly she has a small raised ballotable area that is very tender almost reminiscent of an abscess but there was no obvious purulent surface to it. 02/04/20 upon evaluation today patient appears to be doing fairly well in regard to her wounds today. Fortunately there is no signs of active infection at this time. No fevers, chills, nausea, vomiting, or diarrhea. She has been tolerating the dressing changes without complication. Fortunately I feel like she is showing signs of improvement although has been sometime since have seen her. Nonetheless the area of concern that Dr. Dellia Nims had last week where she had possibly an area of the wrap that was we can allow the leg to bulge appears to be doing significantly better today there is no signs of anything worsening. [name], [name] ([id number]) 4/7; the patient's wounds on her medial and lateral left leg continue to contract. We have been using a regular alginate. Last week she developed an area on the right medial lower leg which is probably a venous ulcer as well. 4/14; the wounds on her left medial and lateral lower leg continue to contract. Surface eschar. We have been using regular alginate. The area on the right medial lower leg is closed. We have been putting both legs under 4-layer contraction. The patient went back to see vein and vascular she had arterial studies done which were apparently "quite good" per the patient although I have not read their notes I have never felt she had an arterial issue. 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 This is been longstanding and refractory to exercise, leg elevation and longstanding use of compression wraps in our clinic as well as compression stockings on the times we have been able to get these to heal 4/21; we thought she actually might be close this week however  she arrives in clinic with a lot of edema in her upper left calf and into her posterior thigh. This is been an intermittent problem here. She says the wrap fell down but it was replaced with a nurse visit on Monday. We are using calcium alginate to the wounds and the wound sizes there not terribly larger than last week but there is a lot more edema 4/28; again wound edges are smaller on both sides. Her edema is better controlled than last time. She is obtained her compression pumps from [employer] although they have not been to her home to set these up. 5/5; left medial and left lateral both look stable. I am not sure the medial is any smaller. We have been using calcium alginate under 4-layer compression.  She had an area on the right medial. This was eschared today. We have been wrapping this as well. She does not tolerate external compression stockings due to a history of various contact allergies. She has her compression pumps however the representative from the company is coming on her to show her how to use these tomorrow 5/19; patient with severe chronic venous insufficiency secondary to central venous disease. She had a stent placed in her left common iliac vein. She has done better since but still difficult to control wounds. She comes in today with nothing open on the right leg. Her areas on the left medial and left lateral are just about closed. We are using calcium alginate under 4-layer compression. She is using her external compression pumps at home She only has 15-20 support stockings. States she cannot get anything tighter than that on. 03/30/20-Patient returns at 1 week, the wounds on the left leg are both slightly bigger, the last week she was on 3 layer compression which started to slide down. She is starting to use her lymphedema pumps although she stated on 1 day her right ankle started to swell up and she have to stop that day. Unfortunately the open area seem to  oscillate between improving to the point of healing and then flaring up all to do with effectiveness of compression or lack of due to the left leg topography not keeping the compression wraps from rolling down 6/2 patient comes in with a 15/20 mmHg stocking on the right leg. She tells me that she developed a lot of swelling in her ankles she saw orthopedics she was felt to possibly be having a flare of pseudogout versus some other type of arthritis. She was put on steroids for a respiratory issue so that helps with the inflammation. She has not been using the pumps all week. She thinks the left thigh is more swollen than usual and I would agree with that. She has an appointment with Dr. Donzetta Matters 9 days or so from now 6/9; both wounds on the left medial and left lateral are smaller. We have been using calcium alginate under compression. She does not have an open wound on the right leg she is using a stocking and her compression pumps things are going well. She has an appointment with Dr. Donzetta Matters with regards to her stent in the left common iliac vein 6/16; the wounds on the left medial and left lateral ankle continues to contract. The patient saw Dr. Donzetta Matters and I think he seems satisfied. Ordered follow-up venous reflux studies on both sides in September. Cautioned that she may need thigh-high stockings. She has been using calcium alginate under compression on the left and her own stocking on the right leg. She tells [place] there are no open wounds on the right 6/23; left lateral is just about closed. Medial required debridement today. We have been using calcium alginate. Extensive discussion about the compression pumps she is only using these on 25 mmHg states she could not take 40 or 30 when the wrap came out to her home to demonstrate these. He said they should not feel tight 6/30; the left lateral wound has a slight amount of eschar. . The area medially is about the same using Hydrofera Blue. 7/7; left lateral  wound still has some eschar. I will remove this next week may be closed. The area medially is very small using Hydrofera Blue with improvement. Unfortunately the stockings fell down. Unfortunately the blisters have developed at the  edge of where the wrap fell. When this happened she says her legs hurt she did not use her pumps. We are not open Monday for her to come in and change the wraps and she had an appointment yesterday. She also tells me that she is going to have an MRI of her back. She is having pain radiating into her left anterior leg she thinks her from an L5 disc. She saw Dr. Ellene Route of neurosurgery 7/14; the area on the left lateral ankle area is closed. Still a small area medially however it looks better as well. We have been using Hydrofera Blue under 4-layer compression 7/21; left lateral ankle is still closed however her wound on the medial left calf is actually larger. This is probably because Hydrofera Blue got stuck to the wound. She came in for a nurse change on Friday and will do that again this week I was concerned about the amount of swelling that she had last week however she is using her compression pumps twice a day and the swelling seems well controlled 7/28; remaining wound on the left medial lower leg is smaller. We have been using moistened silver collagen under compression she is coming back for a nurse visit. For reasons that were not really clear she was just keeping her legs elevated and not using her compression pumps. I have asked her to use the compression pumps. She does not have any wounds on the right leg 06/15/20-Patient returns at 2 weeks, her LLE edema is worse and she developed a blister wound that is new and has bigger posterior calf wound on right, we are using Prisma with pad, 4 layer compression. she has been on lasix 40 mg daily 8/18; patient arrives today with things a lot worse than I remember from a few weeks ago. She was seen last week. Noted that  her edema was worse and that she now had a left lateral wound as well as deteriorating edema in the medial and posterior part of the lower leg. She says she is using his or her external compression pumps once a day although I wonder about the compliance. 8/25; weeping area on the right medial lower leg. This had actually gotten a small localized area of her compression stocking wet. On the left side there is a large denuded area on the posterior medial lower leg and smaller area on the lateral. This was not the original areas that we dealt with. 9/1 the patient's wound on the left leg include the left lateral and left posterior. Larger superficial wounds weeping. She has very poor edema control. Tender localized edema in the left lower medial ankle/heel probably because of localized wrap issues. She freely admits she is not using the compression pumps. She has been up on her feet a lot. She thinks the hydrofera blue is contributing to the pain she is experiencing.. This is a complaint that I have occasionally heard 9/8; really not much improvement. The patient is still complaining of a lot of pain particularly when she uses compression pumps. I switched her to silver alginate last time because she found the Hydrofera Blue to be irritating. I don't hear much difference in her description with the silver alginate. 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 She has follow-up with Dr. Donzetta Matters I think for ultrasound next month MIRABEL, AHLGREN ([id number]) 9/15; quite a bit of improvement today. We have  less edema and more epithelialization in both of her wound areas on the left medial and left lateral calf. These are not the site of her original wounds in this area. She says she has been using her compression pumps for 30 minutes twice a day, there pain issues that never quite understood. Silver alginate as the primary  dressing Objective Constitutional Patient is hypertensive.. Vitals Time Taken: 2:15 PM, Height: 63 in, Weight: 224.7 lbs, BMI: 39.8, Temperature: 98.7 F, Pulse: 85 bpm, Respiratory Rate: 18 breaths/min, Blood Pressure: 150/79 mmHg. Neck Neck supple and symmetrical. No masses or crepitus. Respiratory Respiratory effort is easy and symmetric bilaterally. Rate is normal at rest and on room air.. Cardiovascular Pedal pulses palpable on the left. Her edema control actually looks quite good even into the upper thigh. She is concerned about some soft but feels like dermal lipomas. I do not think these represent anything in the venous system. General Notes: Wound exam; an improvement from last week. The wounds are epithelializing. Superficial. No debridement is necessary. The erythema that she had last time is considerably better wound surfaces are better and swelling is better controlled Integumentary (Hair, Skin) There is some localized erythema on the medial heel and ankle but I do not believe this is cellulitis. Wound #12 status is Open. Original cause of wound was Gradually Appeared. The wound is located on the Left,Lateral Lower Leg. The wound measures 5.5cm length x 5cm width x 0.1cm depth; 21.598cm^2 area and 2.16cm^3 volume. There is Fat Layer (Subcutaneous Tissue) exposed. There is a medium amount of serosanguineous drainage noted. There is small (1-33%) red granulation within the wound bed. There is a large (67-100%) amount of necrotic tissue within the wound bed including Adherent Slough. Wound #5 status is Open. Original cause of wound was Gradually Appeared. The wound is located on the Left,Medial Lower Leg. The wound measures 6cm length x 5.5cm width x 0.1cm depth; 25.918cm^2 area and 2.592cm^3 volume. There is Fat Layer (Subcutaneous Tissue) exposed. There is a medium amount of serosanguineous drainage noted. There is small (1-33%) red granulation within the wound bed. There is  a large (67-100%) amount of necrotic tissue within the wound bed including Adherent Slough. 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 There was a Four Layer Compression Therapy Procedure with a pre-treatment ABI of 1 by Cornell Barman, RN. Post procedure Diagnosis Wound #12: Same as Pre-Procedure SHAKYA, SEBRING ([id number]) Plan Wound Cleansing: Wound #12 Left,Lateral Lower Leg: Dial antibacterial soap, wash wounds, rinse and pat dry prior to dressing wounds Wound #5 Left,Medial Lower Leg: Dial antibacterial soap, wash wounds, rinse and pat dry prior to dressing wounds Anesthetic (add to Medication List): Wound #12 Left,Lateral Lower Leg: Topical Lidocaine 4% cream applied to wound bed prior to debridement (In Clinic Only). - if needed. Wound #5 Left,Medial Lower Leg: Topical Lidocaine 4% cream applied to wound bed prior to debridement (In Clinic Only). - if needed. Skin Barriers/Peri-Wound Care: Wound #12 Left,Lateral Lower Leg: Triamcinolone Acetonide Ointment (TCA) Wound #5 Left,Medial Lower  Leg: Triamcinolone Acetonide Ointment (TCA) Primary Wound Dressing: Wound #12 Left,Lateral Lower Leg: Silver Alginate Wound #5 Left,Medial Lower Leg: Silver Alginate Secondary Dressing: Wound #12 Left,Lateral Lower Leg: ABD pad Wound #5 Left,Medial Lower Leg: ABD pad Dressing Change Frequency: Wound #12 Left,Lateral Lower  Leg: Change dressing every week Other: - Nurse visit Friday and Monday if needed. Wound #5 Left,Medial Lower Leg: Change dressing every week Other: - Nurse visit Friday and Monday if needed. Follow-up Appointments: Wound #12 Left,Lateral Lower Leg: Return Appointment in 1 week. Nurse Visit as needed Wound #5 Left,Medial Lower Leg: Return Appointment in 1 week. Nurse Visit as needed Edema Control: Wound #12 Left,Lateral Lower Leg: 4-Layer Compression System - Left Lower Extremity. Patient to wear own compression stockings - Right leg Compression Pump: Use compression pump on left lower extremity for 60 minutes, twice daily. - at least 45 minutes Compression Pump: Use compression pump on right lower extremity for 60 minutes, twice daily. - at least 45 minutes Wound #5 Left,Medial Lower Leg: 4-Layer Compression System - Left Lower Extremity. Patient to wear own compression stockings - Right leg Compression Pump: Use compression pump on left lower extremity for 60 minutes, twice daily. - at least 45 minutes Compression Pump: Use compression pump on right lower extremity for 60 minutes, twice daily. - at least 45 minutes 1. I think everything looks quite a bit better here this week. 2. We continued with the silver alginate to the wounds medially and laterally. Triamcinolone to control the stasis dermatitis 3. Still in 4 layer compression 4. I have asked her to increase her compression pump usage to 45 minutes twice a day from 30 Electronic Signature(s) Signed: 07/21/2020 4:37:14 PM By: Linton Ham MD Entered By: Linton Ham on 07/20/2020 15:11:28 [place], [name] ([id number]) -------------------------------------------------------------------------------- SuperBill Details Patient Name: TRENITA, HULME. Date of Service: 07/20/2020 Medical Record Number: [id number] Patient Account Number: [id number] Date of Birth/Sex: 22-Jul-1946 (74 y.o. F) Treating RN: Cornell Barman Primary Care  Provider: [name] Other Clinician: Referring Provider: [name] Treating Provider/Extender: Tito Dine in Treatment: 84 Diagnosis Coding ICD-10 Codes Code Description L97.221 Non-pressure chronic ulcer of left calf limited to breakdown of skin I89.0 Lymphedema, not elsewhere classified I87.332 Chronic venous hypertension (idiopathic) with ulcer and inflammation of left lower extremity I83.009 Varicose veins of unspecified lower extremity with ulcer of unspecified site I82.592 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 L97.818 Non-pressure chronic ulcer of other part of right lower leg with other specified severity Facility Procedures CPT4 Code: 37482707 Description: (Facility Use Only) 29581LT - [place] LWR LT LEG Modifier: Quantity: 1 Physician Procedures CPT4 Code: 8675449 Description: 20100 - WC PHYS LEVEL 3 - EST PT Modifier: Quantity: 1 CPT4 Code: Description: ICD-10 Diagnosis Description L97.221 Non-pressure chronic ulcer of left calf limited to breakdown of skin I89.0 Lymphedema, not elsewhere classified Modifier: Quantity: Electronic Signature(s) Signed: 07/21/2020 4:37:14 PM By: Linton Ham MD Entered By: Linton Ham on 07/20/2020 15:12:36

## 2020-08-29 ENCOUNTER — Encounter: Payer: Self-pay | Admitting: Podiatry

## 2020-08-29 ENCOUNTER — Ambulatory Visit (INDEPENDENT_AMBULATORY_CARE_PROVIDER_SITE_OTHER): Payer: Medicare Other | Admitting: Podiatry

## 2020-08-29 ENCOUNTER — Other Ambulatory Visit: Payer: Self-pay

## 2020-08-29 DIAGNOSIS — M79674 Pain in right toe(s): Secondary | ICD-10-CM | POA: Diagnosis not present

## 2020-08-29 DIAGNOSIS — M79675 Pain in left toe(s): Secondary | ICD-10-CM

## 2020-08-29 DIAGNOSIS — I872 Venous insufficiency (chronic) (peripheral): Secondary | ICD-10-CM

## 2020-08-29 DIAGNOSIS — B351 Tinea unguium: Secondary | ICD-10-CM | POA: Diagnosis not present

## 2020-08-29 NOTE — Progress Notes (Signed)
This patient returns to my office for at risk foot care.  This patient requires this care by a professional since this patient will be at risk due to having chronic venous insufficiency.  Patient is wearing an unna boot and is under care from the wound center left  leg.  Wearing compression sock right leg..  Patient is taking trental.This patient is unable to cut nails herself  since the patient cannot reach her  nails.These nails are painful walking and wearing shoes.  This patient presents for at risk foot care today.  General Appearance  Alert, conversant and in no acute stress.  Vascular  Dorsalis pedis and posterior tibial pulses are palpable.    Neurologic  See- Karren Cobble test  WVL  B/L.  Nails Thick disfigured discolored nails with subungual debris  from hallux to fifth toes bilaterally. No evidence of bacterial infection or drainage bilaterally. Pincer hallux nails  B/L.  Orthopedic  No limitations of motion  feet .  No crepitus or effusions noted.  No bony pathology or digital deformities noted. DJD [name]  B/L.  Skin  normotropic skin with no porokeratosis noted bilaterally.  No signs of infections or ulcers noted.   Unknown swelling dorsum of right foot.  Onychomycosis  Pain in right toes  Pain in left toes  Consent was obtained for treatment procedures.   Mechanical debridement of nails 1-5  bilaterally performed with a nail nipper.  Filed with dremel without incident. No infection or ulcer.     Return office visit   10  weeks        Told patient to return for periodic foot care and evaluation due to potential at risk complications.   Gardiner Barefoot DPM

## 2020-09-09 ENCOUNTER — Telehealth: Payer: Self-pay | Admitting: Family Medicine

## 2020-09-09 ENCOUNTER — Ambulatory Visit
Admission: EM | Admit: 2020-09-09 | Discharge: 2020-09-09 | Disposition: A | Discharge status: Home / Self Care | Payer: Medicare Other | Attending: Emergency Medicine | Admitting: Emergency Medicine

## 2020-09-09 ENCOUNTER — Other Ambulatory Visit: Payer: Self-pay

## 2020-09-09 ENCOUNTER — Telehealth: Payer: Self-pay | Admitting: *Deleted

## 2020-09-09 ENCOUNTER — Encounter: Payer: Self-pay | Admitting: Emergency Medicine

## 2020-09-09 DIAGNOSIS — J4521 Mild intermittent asthma with (acute) exacerbation: Secondary | ICD-10-CM

## 2020-09-09 MED ORDER — PREDNISONE 10 MG PO TABS: 40.0000 mg | ORAL_TABLET | Freq: Every day | ORAL | 0 refills | Status: AC

## 2020-09-09 NOTE — Telephone Encounter (Signed)
Patient called the office and was transferred to access nurse and was advised she needed to be seen within 3-4 hours.  Patient called the office back and was transferred to triage because she was complaining of wheezing. Patient stated that she started wheezing a lot about 2 days ago and has been having to use her rescue inhaler several times. Patient stated that her head is real stopped up and she thinks that it is her allergies. Patient denies a fever or SOB. After speaking to Dr. Danise Mina patient was advised that she needs to go to an UC and be seen. Patient was given information on the UC at [facility] because they have x-ray equipment. Patient stated hat she prefers to go to the UC in [place] because she is more familiar with that area. Patient was advised that they are not able to do a chest xray if she needs one. Patient stated that she does not feel comfortable going anywhere other than [place] and will go the the UC there.

## 2020-09-09 NOTE — Telephone Encounter (Signed)
[place] Day - Client TELEPHONE ADVICE RECORD AccessNurse Patient Name: [name] Gender: Female DOB: July 05, 1946 Age: 75 Y 2 M 1 D Return Phone Number: [phone] (Primary) Address: City/State/Zip: [place] [address] Client [facility] Day - Client Client Site [place] - Day Physician [name] - MD Contact Type Call Who Is Calling Patient / Member / Family / Caregiver Call Type Triage / Clinical Relationship To Patient Self Return Phone Number [phone] (Primary) Chief Complaint BREATHING - fast, heavy or wheezing Reason for Call Symptomatic / Request for Health Information Initial Comment Caller states patinet is wheezing with dizziness Translation No Nurse Assessment Nurse: Ysidro Evert, RN, Levada Dy Date/Time (Eastern Time): 09/09/2020 11:57:04 AM Confirm and document reason for call. If symptomatic, describe symptoms. ---Caller states she is having shortness of breath and she is using her inhaler more often. She has some dizziness at times. No fever and no cough Does the patient have any new or worsening symptoms? ---Yes Will a triage be completed? ---Yes Related visit to physician within the last 2 weeks? ---No Does the PT have any chronic conditions? (i.e. diabetes, asthma, this includes High risk factors for pregnancy, etc.) ---Yes List chronic conditions. ---asthma, hypertension Is this a behavioral health or substance abuse call? ---No Guidelines Guideline Title Affirmed Question Affirmed Notes Nurse Date/Time (Eastern Time) Breathing Difficulty [1] MILD difficulty breathing (e.g., minimal/ no SOB at rest, SOB with walking, pulse <100) AND [2] NEW-onset or WORSE than normal Ysidro Evert, Therapist, sports, Levada Dy 09/09/2020 12:00:01 PM Disp. Time [name] Time) Disposition Final User 09/09/2020 11:55:34 AM Send to Urgent [place], [place] 09/09/2020 12:05:56 PM See HCP within 4 Hours (or  PCP triage) Yes Ysidro Evert, RN, Levada Dy PLEASE NOTE: All timestamps contained within this report are represented as [place] Standard Time. CONFIDENTIALTY NOTICE: This fax transmission is intended only for the addressee. It contains information that is legally privileged, confidential or otherwise protected from use or disclosure. If you are not the intended recipient, you are strictly prohibited from reviewing, disclosing, copying using or disseminating any of this information or taking any action in reliance on or regarding this information. If you have received this fax in error, please notify us immediately by telephone so that we can arrange for its return to [place]. Phone: [phone], Toll-Free: [phone], Fax: [phone] Page: 2 of 2 Call Id: [id number] [place] Disagree/Comply Comply Caller Understands Yes PreDisposition Did not know what to do Care Advice Given Per Guideline SEE HCP (OR PCP TRIAGE) WITHIN 4 HOURS: * IF OFFICE WILL BE OPEN: You need to be seen within the next 3 or 4 hours. Call your doctor (or NP/PA) now or as soon as the office opens. CALL BACK IF: * You become worse CARE ADVICE given per Breathing Difficulty (Adult) guideline. Referrals REFERRED TO PCP OFFICE

## 2020-09-09 NOTE — Discharge Instructions (Signed)
Take the prednisone as directed.  Continue using your albuterol inhaler as directed.    Call 911 and go to the emergency department if you have difficulty breathing.    Follow-up with your primary care provider next week.

## 2020-09-09 NOTE — Telephone Encounter (Signed)
Pt called to let you know  She used the triamcinolone cream is not helping no change   She has used this for 2 weeks  What do you recommend pt use?  [facility] [place]

## 2020-09-09 NOTE — ED Provider Notes (Signed)
[facility]    CSN: [id number] Arrival date & time: 09/09/20  1521      History   Chief Complaint Chief Complaint  Patient presents with   Wheezing    HPI [name] is a 74 y.o. female.   Patient presents with wheezing intermittently since yesterday.  She states she has had to use her albuterol inhaler several times yesterday and twice today.  She denies fever, chills, congestion, cough, or other symptoms.  Patient is followed by pulmonologist for asthma.  Her medical history includes asthma, sleep apnea, hypertension, arthritis, DVT, cervical spondylosis, venous insufficiency, diverticulosis.  The history is provided by the patient and medical records.    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/2016   Chronic venous insufficiency 2016   severe reflux with painful varicosities sees [facility]   Clotting disorder ([place])    due to vein ablation    Complication of anesthesia    difficulty coming out due to sleep apnea per pt   Difficult intubation    PATIENT DENIES    Diverticulosis    per ct scans 09-2016 and 01-2017   DVT (deep venous thrombosis) ([place]) 2011   small, developed after venous ablation   DVT (deep venous thrombosis) ([place])    Family history of adverse reaction to anesthesia    O2 levels drop upon waking    Hearing loss sensory, bilateral 2013   mod-severe high freq sensorineural ([facility])   Hiatal hernia     09-2016 ct scan HP at cancer center    History of kidney stones 1980s   History of radiation therapy 07/19/11-08/25/2011   RIGHT AXILLARY REGION/METASTATIC   Hypertension    Melanoma ([place]) 06/29/10   MALIGNANT MELANOMA R SHOULDER/SUPRASCAPULAR BACK s/p interferon chemo and XRT    Neuromuscular disorder ([place])    muscle spasms   OSA (obstructive sleep apnea)    on CPAP   Pneumonia    2001   RLS (restless legs syndrome)    Seasonal allergies    Sleep apnea    wears cpap   Tinnitus    Unspecified vitamin D deficiency 03/18/2013   Varicose veins of left lower extremity     Patient Active Problem List   Diagnosis Date Noted   Somnolence 04/12/2020   Positive ANA (antinuclear antibody) 12/01/2019   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   Peripheral neuropathy 10/14/2019   Pain due to onychomycosis of toenails of both feet 05/04/2019   Kidney cysts 10/08/2018   Liver cyst 10/08/2018   Calcium pyrophosphate arthropathy of multiple sites 08/20/2018   CTS (carpal tunnel syndrome) 07/13/2018   Skin rash 04/04/2018   Venous stasis ulcer of left lower leg with edema of left lower leg ([place]) 02/05/2018   IDA (iron deficiency anemia) 04/11/2017   Elbow stiffness, right 03/08/2017   Leg pain, bilateral 08/28/2016   Bilateral lower extremity edema 08/13/2016   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   Kyphosis of cervical region 07/02/2016   Intertrigo 03/22/2016   Cervical myelopathy with cervical radiculopathy ([place]) 08/18/2015   Varicose veins of both lower extremities with complications 57/84/6962   Advanced care planning/counseling discussion 09/22/2014   Medicare annual wellness visit, subsequent 05/15/2013  Allergic rhinitis 04/03/2013   Vitamin D deficiency 03/18/2013   Hypertension    Asthma in adult    Chronic venous insufficiency    Arthritis    OSA (obstructive sleep apnea)    RLS (restless legs syndrome)    Melanoma ([place])     Past Surgical History:  Procedure Laterality Date   ABI  2016   WNL   ANTERIOR CERVICAL DECOMP/DISCECTOMY FUSION N/A 08/22/2015   ANTERIOR CERVICAL DECOMPRESSION FUSION C3/4 - interbody graft and anterior plate; Kristeen Miss, MD   ANTERIOR CERVICAL DECOMP/DISCECTOMY FUSION N/A 07/02/2016    Procedure: Cervical four- five Cervical five- six Cervical six- seven Anterior cervical decompression/diskectomy/fusion;  Surgeon: Kristeen Miss, MD;  Location: [facility];  Service: Neurosurgery;  Laterality: N/A;  C4-5 C5-6 C6-7 Anterior cervical decompression/diskectomy/fusion   BREAST BIOPSY Left 2013   BENIGN   BREAST SURGERY Right 1990   BX    CARDIOVASCULAR STRESS TEST  2010   normal stress test, EF 66%   CATARACT EXTRACTION W/ INTRAOCULAR LENS IMPLANT Left 2019   Dr. Kathrin Penner   COLONOSCOPY  03/2005   benign polyp, int hem, rpt 5 yrs ([place], [place])   COLONOSCOPY WITH PROPOFOL N/A 01/06/2018   diverticulosis, decreased sphincter tone, no f/u needed [name], Kirke Corin, MD)   [place] / EXCISION  2012   RIGHT   DEXA  06/2016   WNL   DILATION AND CURETTAGE OF UTERUS  2010   ENDOVENOUS ABLATION SAPHENOUS VEIN W/ LASER Left 2010   [facility]   ENDOVENOUS ABLATION SAPHENOUS VEIN W/ LASER Left 2016   accessory branch [facility] Kellie Simmering)   ESOPHAGOGASTRODUODENOSCOPY  2006   mod chronic carditis, no H pylori ([place], [place])   INTRAVASCULAR ULTRASOUND/IVUS N/A 08/03/2019   Waynetta Sandy, [place]   left   LOWER EXTREMITY VENOGRAPHY Left 08/03/2019   Waynetta Sandy, MD   LUMBAR LAMINECTOMY  2001   L4/5   MELANOMA EXCISION  2011   Right shoulder, with sentinel lymph node biopsy   PERIPHERAL VASCULAR INTERVENTION Left 08/03/2019   Stent of left common iliac vein with 14 x 60 mm Vici Donzetta Matters, [place] Dom, MD)   [facility] REMOVAL  12/05/2011   Procedure: REMOVAL PORT-A-CATH;  Surgeon: Rolm Bookbinder, MD;  Location: [place];  Service: General;  Laterality: N/A;  left port removal   PORTACATH PLACEMENT     left subclavian   UPPER GASTROINTESTINAL ENDOSCOPY      OB History    Gravida  2   Para  0   Term  0   Preterm  0   AB  2   Living  0     SAB  2   TAB  0    Ectopic  0   Multiple  0   Live Births  0            Home Medications    Prior to Admission medications   Medication Sig Start Date End Date Taking? Authorizing Provider  albuterol (VENTOLIN HFA) 108 (90 Base) MCG/ACT inhaler INHALE 2 PUFFS BY MOUTH EVERY 6 HORUS AS NEEDED FOR WHEEZING/SHORTNESS OF BREATH 12/28/19  Yes [name], MD  aspirin EC 81 MG tablet Take 81 mg by mouth at bedtime. Melanoma prevention   Yes [provider]  furosemide (LASIX) 40 MG tablet Take 1 tablet (40 mg total) by mouth 2 (two) times daily as needed for fluid. Take second  dose if needed for leg swelling 06/17/20  Yes [name], MD  losartan (COZAAR) 25 MG tablet TAKE 2 TABLETS BY MOUTH EVERY DAY 05/17/20  Yes [name], MD  montelukast (SINGULAIR) 10 MG tablet TAKE 1 TABLET BY MOUTH EVERY DAY 07/26/20  Yes [name], MD  potassium chloride SA (KLOR-CON) 20 MEQ tablet Take 1 tablet (20 mEq total) by mouth 2 (two) times daily. 10/29/19  Yes [name], MD  Probiotic Product (PROBIOTIC DAILY PO) Take 1 tablet by mouth daily.    Yes [provider]  Vitamin D, Ergocalciferol, (DRISDOL) 1.25 MG (50000 UNIT) CAPS capsule TAKE 1 CAPSULE (50,000 UNITS TOTAL) BY MOUTH EVERY 7 (SEVEN) DAYS. 12/19/19  Yes [name], MD  acetaminophen (TYLENOL ARTHRITIS PAIN) 650 MG CR tablet Take 1,300 mg by mouth every 8 (eight) hours as needed for pain.     [provider]  famotidine (PEPCID) 20 MG tablet Take 1 tablet (20 mg total) by mouth at bedtime. 01/06/20   [name], MD  loratadine (CLARITIN) 10 MG tablet Take 10 mg by mouth daily.    [provider]  meloxicam (MOBIC) 15 MG tablet TAKE 1 TABLET BY MOUTH EVERY DAY AS NEEDED FOR PAIN 05/14/20   [name], MD  Multiple Vitamin (MULTIVITAMIN WITH MINERALS) TABS tablet Take 1 tablet by mouth daily.    [provider]  Polyvinyl Alcohol-Povidone (TEARS PLUS OP) Place 1 drop into  both eyes daily as needed (dry eyes/ redness/ burning).     [provider]  predniSONE (DELTASONE) 10 MG tablet Take 4 tablets (40 mg total) by mouth daily for 5 days. 09/09/20 09/14/20  Sharion Balloon, NP  PRESCRIPTION MEDICATION CPAP    [provider]  triamcinolone (NASACORT ALLERGY 24HR) 55 MCG/ACT AERO nasal inhaler Place 2 sprays into the nose daily. In each nostril    [provider]  triamcinolone cream (KENALOG) 0.1 % Apply 1 application topically 2 (two) times daily. Apply to AA. Max 2 weeks at a time. 08/22/20 08/22/21  [name], MD  UNABLE TO FIND Take by mouth.    [provider]    Family History Family History  Problem Relation Age of Onset   Cancer Mother 23       uterine   CAD Father 2       MI   Hypertension Father 29   Alzheimer's disease Father 74   Cancer Cousin        x2, breast   Diabetes Sister    Cancer Paternal Grandmother        melanoma, possibly   Colon cancer Neg Hx    Colon polyps Neg Hx    Rectal cancer Neg Hx    Stomach cancer Neg Hx     Social History Social History   Tobacco Use   Smoking status: Former Smoker    Types: Cigarettes    Quit date: 11/05/1965    Years since quitting: 54.8   Smokeless tobacco: Never Used   Tobacco comment: socially as a teen  Scientific laboratory technician Use: Former  Substance Use Topics   Alcohol use: No    Alcohol/week: 0.0 standard drinks   Drug use: No     Allergies   Sulfa antibiotics, Voltaren [diclofenac sodium], Latex, Tape, Doxycycline, and Other   Review of Systems Review of Systems  Constitutional: Negative for chills and fever.  HENT: Negative for congestion, ear pain and sore throat.   Eyes: Negative for pain  and visual disturbance.  Respiratory: Positive for wheezing. Negative for cough and shortness of breath.   Cardiovascular: Negative for chest pain and palpitations.  Gastrointestinal: Negative for abdominal pain and vomiting.    Genitourinary: Negative for dysuria and hematuria.  Musculoskeletal: Negative for arthralgias and back pain.  Skin: Negative for color change and rash.  Neurological: Negative for seizures and syncope.  All other systems reviewed and are negative.    Physical Exam Triage Vital Signs ED Triage Vitals  Enc Vitals Group     BP 09/09/20 1614 (!) 146/82     Pulse Rate 09/09/20 1614 61     Resp 09/09/20 1614 (!) 21     Temp 09/09/20 1614 98.3 F (36.8 C)     Temp Source 09/09/20 1614 Oral     SpO2 09/09/20 1614 (!) 59 %     Weight --      Height --      Head Circumference --      Peak Flow --      Pain Score 09/09/20 1610 0     Pain Loc --      Pain Edu? --      Excl. in [place]? --    No data found.  Updated Vital Signs BP (!) 146/82 (BP Location: Left Arm)    Pulse 61    Temp 98.3 F (36.8 C) (Oral)    Resp (!) 21    SpO2 95%   Visual Acuity Right Eye Distance:   Left Eye Distance:   Bilateral Distance:    Right Eye Near:   Left Eye Near:    Bilateral Near:     Physical Exam Vitals and nursing note reviewed.  Constitutional:      General: She is not in acute distress.    Appearance: She is well-developed.  HENT:     Head: Normocephalic and atraumatic.     Right Ear: Tympanic membrane normal.     Left Ear: Tympanic membrane normal.     Nose: Nose normal.     Mouth/Throat:     Mouth: Mucous membranes are moist.     Pharynx: Oropharynx is clear.  Eyes:     Conjunctiva/sclera: Conjunctivae normal.  Cardiovascular:     Rate and Rhythm: Normal rate and regular rhythm.     Heart sounds: Normal heart sounds.  Pulmonary:     Effort: Pulmonary effort is normal. No respiratory distress.     Breath sounds: Normal breath sounds. No wheezing.     Comments: Diminished throughout. Abdominal:     Palpations: Abdomen is soft.     Tenderness: There is no abdominal tenderness.  Musculoskeletal:     Cervical back: Neck supple.  Skin:    General: Skin is warm and dry.   Neurological:     General: No focal deficit present.     Mental Status: She is alert and oriented to person, place, and time.     Gait: Gait normal.  Psychiatric:        Mood and Affect: Mood normal.        Behavior: Behavior normal.      UC Treatments / Results  Labs (all labs ordered are listed, but only abnormal results are displayed) Labs Reviewed - No data to display  EKG   Radiology No results found.  Procedures Procedures (including critical care time)  Medications Ordered in UC Medications - No data to display  Initial Impression / Assessment and Plan / UC Course  I  have reviewed the triage vital signs and the nursing notes.  Pertinent labs & imaging results that were available during my care of the patient were reviewed by me and considered in my medical decision making (see chart for details).   Asthma exacerbation.  Treating with prednisone and continued use of albuterol inhaler.  Instructed patient to call 911 and go to the emergency department if she has difficulty breathing.  Instructed her to follow-up with her PCP or pulmonologist next week.  Patient agrees to plan of care.   Final Clinical Impressions(s) / UC Diagnoses   Final diagnoses:  Mild intermittent asthma with acute exacerbation     Discharge Instructions     Take the prednisone as directed.  Continue using your albuterol inhaler as directed.    Call 911 and go to the emergency department if you have difficulty breathing.    Follow-up with your primary care provider next week.        ED Prescriptions    Medication Sig Dispense Auth. Provider   predniSONE (DELTASONE) 10 MG tablet Take 4 tablets (40 mg total) by mouth daily for 5 days. 20 tablet Sharion Balloon, NP     PDMP not reviewed this encounter.   Sharion Balloon, NP 09/09/20 1640

## 2020-09-09 NOTE — ED Triage Notes (Signed)
Patient c/o "upper respiratory wheezing" x 1 day.   Patient stated she's using her rescue inhaler more than normal ("6 times in a day").   Patient denies fever or cough.   History of asthma (allergy associated).

## 2020-09-09 NOTE — Telephone Encounter (Signed)
Will await [facility] eval.  

## 2020-09-14 NOTE — Telephone Encounter (Addendum)
rec stop TCI cream.  Just continue leg elevation and compression stocking use and regular moisturizing.  Actually, did the prednisone course over the weekend (treated for asthma exacerbation) help the leg rash?

## 2020-09-14 NOTE — Telephone Encounter (Signed)
Spoke with pt relaying Dr. Synthia Innocent message.  Pt verbalizes understanding.  States she stopped the cream about 1 wk ago.  Says the rash looks better.  However, she has a burning sensation in the left leg.  States she has UC f/u on Fri, 09/16/20.

## 2020-09-14 NOTE — Telephone Encounter (Signed)
Seen at [facility], treated for asthma flare with prednisone course.

## 2020-09-14 NOTE — Telephone Encounter (Signed)
Pt returned your call Pt needs to leave at 2 and you can leave message

## 2020-09-14 NOTE — Telephone Encounter (Signed)
Lvm asking pt to call back. Need to relay Dr. G's message and get answer to his question.  

## 2020-09-16 ENCOUNTER — Ambulatory Visit (INDEPENDENT_AMBULATORY_CARE_PROVIDER_SITE_OTHER): Payer: Medicare Other | Admitting: Family Medicine

## 2020-09-16 ENCOUNTER — Other Ambulatory Visit: Payer: Self-pay

## 2020-09-16 ENCOUNTER — Encounter: Payer: Self-pay | Admitting: Family Medicine

## 2020-09-16 VITALS — BP 164/90 | HR 81 | Temp 97.6°F | Ht 63.0 in | Wt 225.2 lb

## 2020-09-16 DIAGNOSIS — R21 Rash and other nonspecific skin eruption: Secondary | ICD-10-CM | POA: Diagnosis not present

## 2020-09-16 DIAGNOSIS — R609 Edema, unspecified: Secondary | ICD-10-CM

## 2020-09-16 DIAGNOSIS — I83029 Varicose veins of left lower extremity with ulcer of unspecified site: Secondary | ICD-10-CM | POA: Diagnosis not present

## 2020-09-16 DIAGNOSIS — L97929 Non-pressure chronic ulcer of unspecified part of left lower leg with unspecified severity: Secondary | ICD-10-CM | POA: Diagnosis not present

## 2020-09-16 DIAGNOSIS — I83892 Varicose veins of left lower extremities with other complications: Secondary | ICD-10-CM | POA: Diagnosis not present

## 2020-09-16 DIAGNOSIS — I10 Essential (primary) hypertension: Secondary | ICD-10-CM | POA: Diagnosis not present

## 2020-09-16 DIAGNOSIS — M79605 Pain in left leg: Secondary | ICD-10-CM

## 2020-09-16 DIAGNOSIS — M79604 Pain in right leg: Secondary | ICD-10-CM | POA: Diagnosis not present

## 2020-09-16 MED ORDER — CEPHALEXIN 500 MG PO CAPS: 500.0000 mg | ORAL_CAPSULE | Freq: Three times a day (TID) | ORAL | 0 refills | Status: DC

## 2020-09-16 NOTE — Assessment & Plan Note (Signed)
Previously thought related to stasis dermatitis, did not respond to topical triamcinolone or to recent prednisone burst.  Known CVI contributes.  Will cover for cellulitis with keflex 500mg  TID course x 7 days, increase lasix to 40mg  BID for 3 days then update with effect. Pt agrees with plan. Reviewed importance of avoiding cat scratches to avoid infections.

## 2020-09-16 NOTE — Patient Instructions (Addendum)
For left leg redness /swelling - start keflex antibiotic three times daily for 1 week, increase lasix to 40mg  twice daily for 3 days. Double up on potassium at the same time for those 3 days.  Keep leg elevated and keep compression stocking on during the day.  Let me know if not improving with this.  Limit salt in the diet.

## 2020-09-16 NOTE — Progress Notes (Signed)
Patient ID: [name], female    DOB: [birth year], 74 y.o.   MRN: [id number]  This visit was conducted in person.  BP (!) 164/90 (BP Location: Left Arm, Patient Position: Sitting, Cuff Size: Large)   Pulse 81   Temp 97.6 F (36.4 C) (Temporal)   Ht 5\' 3"  (1.6 m)   Wt 225 lb 4 oz (102.2 kg)   SpO2 96%   BMI 39.90 kg/m   BP Readings from Last 3 Encounters:  09/16/20 (!) 164/90  09/09/20 (!) 146/82  08/22/20 136/90  Elevated on repeat testing.   CC: UCC f/u visit  Subjective:   HPI: [name] is a 74 y.o. female presenting on 09/16/2020 for Hospitalization Follow-up (Seen at [facility] 09/09/20.)   Seen at [facility] 09/09/2020 treated for asthma exacerbation with prednisone 40mg  5d course - also recommended continue albuterol use. Chest tightness and wheezing has improved. Notes ongoing exertional dyspnea.   Ongoing left leg swelling and mild redness associated with open sores that develop along leg. H/o chronic leg wrapping for 18 months from 12/2018 to 08/2020. Regularly using compression stockings at home. No change in leg with prednisone course. Followed by Dr Donzetta Matters - referred to Dr Scot Dock for further evaluation for targets of intervention to smaller veins of leg.   Uses lymphedema pump nightly.  Last saw Dr Ronnald Ramp 06/2020 for annual exam.   16 cats live at home.  HTN - bp elevated recently. Doesn't have cuff to check at home.      Relevant past medical, surgical, family and social history reviewed and updated as indicated. Interim medical history since our last visit reviewed. Allergies and medications reviewed and updated. Outpatient Medications Prior to Visit  Medication Sig Dispense Refill  . acetaminophen (TYLENOL ARTHRITIS PAIN) 650 MG CR tablet Take 1,300 mg by mouth every 8 (eight) hours as needed for pain.     Marland Kitchen albuterol (VENTOLIN HFA) 108 (90 Base) MCG/ACT inhaler INHALE 2 PUFFS BY MOUTH EVERY 6 HORUS AS NEEDED FOR WHEEZING/SHORTNESS OF BREATH 8.5 g 3  . aspirin EC  81 MG tablet Take 81 mg by mouth at bedtime. Melanoma prevention    . famotidine (PEPCID) 20 MG tablet Take 1 tablet (20 mg total) by mouth at bedtime.    . furosemide (LASIX) 40 MG tablet Take 1 tablet (40 mg total) by mouth 2 (two) times daily as needed for fluid. Take second dose if needed for leg swelling 60 tablet 6  . loratadine (CLARITIN) 10 MG tablet Take 10 mg by mouth daily.    Marland Kitchen losartan (COZAAR) 25 MG tablet TAKE 2 TABLETS BY MOUTH EVERY DAY 60 tablet 5  . meloxicam (MOBIC) 15 MG tablet TAKE 1 TABLET BY MOUTH EVERY DAY AS NEEDED FOR PAIN 30 tablet 1  . montelukast (SINGULAIR) 10 MG tablet TAKE 1 TABLET BY MOUTH EVERY DAY 30 tablet 3  . Multiple Vitamin (MULTIVITAMIN WITH MINERALS) TABS tablet Take 1 tablet by mouth daily.    . Polyvinyl Alcohol-Povidone (TEARS PLUS OP) Place 1 drop into both eyes daily as needed (dry eyes/ redness/ burning).     . potassium chloride SA (KLOR-CON) 20 MEQ tablet Take 1 tablet (20 mEq total) by mouth 2 (two) times daily. 180 tablet 3  . PRESCRIPTION MEDICATION CPAP    . Probiotic Product (PROBIOTIC DAILY PO) Take 1 tablet by mouth daily.     Marland Kitchen triamcinolone (NASACORT ALLERGY 24HR) 55 MCG/ACT AERO nasal inhaler Place 2 sprays into the nose  daily. In each nostril    . triamcinolone cream (KENALOG) 0.1 % Apply 1 application topically 2 (two) times daily. Apply to AA. Max 2 weeks at a time. 80 g 1  . UNABLE TO FIND Take by mouth.    . Vitamin D, Ergocalciferol, (DRISDOL) 1.25 MG (50000 UNIT) CAPS capsule TAKE 1 CAPSULE (50,000 UNITS TOTAL) BY MOUTH EVERY 7 (SEVEN) DAYS. 12 capsule 3   No facility-administered medications prior to visit.     Per HPI unless specifically indicated in ROS section below Review of Systems Objective:  BP (!) 164/90 (BP Location: Left Arm, Patient Position: Sitting, Cuff Size: Large)   Pulse 81   Temp 97.6 F (36.4 C) (Temporal)   Ht 5\' 3"  (1.6 m)   Wt 225 lb 4 oz (102.2 kg)   SpO2 96%   BMI 39.90 kg/m   Wt Readings from  Last 3 Encounters:  09/16/20 225 lb 4 oz (102.2 kg)  08/22/20 228 lb 9.6 oz (103.7 kg)  08/19/20 221 lb (100.2 kg)      Physical Exam Vitals and nursing note reviewed.  Constitutional:      Appearance: Normal appearance. She is obese. She is not ill-appearing.  Cardiovascular:     Rate and Rhythm: Normal rate and regular rhythm.     Pulses: Normal pulses.     Heart sounds: Normal heart sounds. No murmur heard.   Pulmonary:     Effort: Pulmonary effort is normal. No respiratory distress.     Breath sounds: Normal breath sounds. No wheezing, rhonchi or rales.  Musculoskeletal:        General: Swelling present.     Right lower leg: Edema present.     Left lower leg: Edema present.     Comments:  Tight swelling to legs L>R  1+ DP on left   Skin:    General: Skin is warm and dry.     Findings: Erythema and rash present.     Comments:  Scabbed erosions to LLE, weeping of leg present  Faint erythema to lower leg deepens into dorsal foot  Neurological:     Mental Status: She is alert.  Psychiatric:        Mood and Affect: Mood normal.        Behavior: Behavior normal.       Results for orders placed or performed in visit on 08/22/20  Sedimentation rate  Result Value Ref Range   Sed Rate 10 0 - 30 mm/hr  CBC with Differential/Platelet  Result Value Ref Range   WBC 6.2 4.0 - 10.5 K/uL   RBC 4.24 3.87 - 5.11 Mil/uL   Hemoglobin 12.7 12.0 - 15.0 g/dL   HCT 38.5 36 - 46 %   MCV 90.9 78.0 - 100.0 fl   MCHC 33.0 30.0 - 36.0 g/dL   RDW 13.6 11.5 - 15.5 %   Platelets 191.0 150 - 400 K/uL   Neutrophils Relative % 61.0 43 - 77 %   Lymphocytes Relative 26.0 12 - 46 %   Monocytes Relative 7.7 3 - 12 %   Eosinophils Relative 4.0 0 - 5 %   Basophils Relative 1.3 0 - 3 %   Neutro Abs 3.8 1.4 - 7.7 K/uL   Lymphs Abs 1.6 0.7 - 4.0 K/uL   Monocytes Absolute 0.5 0.1 - 1.0 K/uL   Eosinophils Absolute 0.2 0.0 - 0.7 K/uL   Basophils Absolute 0.1 0.0 - 0.1 K/uL   *Note: Due to a large  number  of results and/or encounters for the requested time period, some results have not been displayed. A complete set of results can be found in Results Review.   Assessment & Plan:  This visit occurred during the SARS-CoV-2 public health emergency.  Safety protocols were in place, including screening questions prior to the visit, additional usage of staff PPE, and extensive cleaning of exam room while observing appropriate contact time as indicated for disinfecting solutions.   Problem List Items Addressed This Visit    Venous stasis ulcer of left lower leg with edema of left lower leg (HCC)   Skin rash - Primary    Previously thought related to stasis dermatitis, did not respond to topical triamcinolone or to recent prednisone burst.  Known CVI contributes.  Will cover for cellulitis with keflex 500mg  TID course x 7 days, increase lasix to 40mg  BID for 3 days then update with effect. Pt agrees with plan. Reviewed importance of avoiding cat scratches to avoid infections.       Leg pain, bilateral   Hypertension    BP again elevated today, denies increased salt intake recently, compliant with losartan 50mg  daily and lasix 40mg  daily.  Will increase lasix to 40mg  BID x3 days, update with effect. She does not have way to check BP at home. Reassess at CPE next month.           Meds ordered this encounter  Medications  . cephALEXin (KEFLEX) 500 MG capsule    Sig: Take 1 capsule (500 mg total) by mouth 3 (three) times daily.    Dispense:  21 capsule    Refill:  0   No orders of the defined types were placed in this encounter.   Patient Instructions  For left leg redness /swelling - start keflex antibiotic three times daily for 1 week, increase lasix to 40mg  twice daily for 3 days. Double up on potassium at the same time for those 3 days.  Keep leg elevated and keep compression stocking on during the day.  Let me know if not improving with this.  Limit salt in the diet.   Follow up  plan: Return if symptoms worsen or fail to improve.  [name], MD

## 2020-09-16 NOTE — Assessment & Plan Note (Signed)
BP again elevated today, denies increased salt intake recently, compliant with losartan 50mg  daily and lasix 40mg  daily.  Will increase lasix to 40mg  BID x3 days, update with effect. She does not have way to check BP at home. Reassess at CPE next month.

## 2020-09-21 ENCOUNTER — Telehealth: Payer: Self-pay

## 2020-09-21 NOTE — Telephone Encounter (Signed)
No available appts at [facility] today; pt last seen 09/16/20 for HFU.

## 2020-09-21 NOTE — Telephone Encounter (Signed)
[facility] - Client TELEPHONE ADVICE RECORD [facility] Patient Name: [name] Gender: Female DOB: [birth year] Age: 73 Y 2 M 12 D Return Phone Number: [phone] (Primary) Address: City/State/Zip: [place] [address] Client [name] - Client Client Site [place] Physician [name] - MD Contact Type Call Who Is Calling Patient / Member / Family / Caregiver Call Type Triage / Clinical Relationship To Patient Self Return Phone Number [phone] (Primary) Chief Complaint Leg Pain Reason for Call Symptomatic / Request for [name] says that she has pain on her left leg. She has a raptured disc on her back and wants to know what she can take. Translation No Nurse Assessment Nurse: Alinda Money, RN, Sarah Date/Time [name] Time): 09/20/2020 4:51:03 PM Confirm and document reason for call. If symptomatic, describe symptoms. ---Caller states she has ruptured discs in her lower back. States normally does 5 days of prednisone for flare up. States she just finished prednisone in the last 2 weeks for URI but is now having a flare up of pain. States normally takes Meloxicam or Tylenol Arthritis but unsure what to take since now also take Cephalexin. Does the patient have any new or worsening symptoms? ---Yes Will a triage be completed? ---Yes Related visit to physician within the last 2 weeks? ---Yes Does the PT have any chronic conditions? (i.e. diabetes, asthma, this includes High risk factors for pregnancy, etc.) ---Yes List chronic conditions. ---Ruptured discs in lower back. Is this a behavioral health or substance abuse call? ---No Guidelines Guideline Title Affirmed Question Affirmed Notes Nurse Date/Time (Eastern Time) Back Pain Soft tissue infection (e.g., abscess, cellulitis) or other serious infection (e.g., bacteremia) in last 2 weeks Goins,  RN, Judson Roch 09/20/2020 4:53:58 PM Disp. Time [name] Time) Disposition Final User 09/20/2020 4:57:45 PM See PCP within 24 Hours Yes Goins, RN, Judson Roch PLEASE NOTE: All timestamps contained within this report are represented as [place] Standard Time. CONFIDENTIALTY NOTICE: This fax transmission is intended only for the addressee. It contains information that is legally privileged, confidential or otherwise protected from use or disclosure. If you are not the intended recipient, you are strictly prohibited from reviewing, disclosing, copying using or disseminating any of this information or taking any action in reliance on or regarding this information. If you have received this fax in error, please notify us immediately by telephone so that we can arrange for its return to [place]. Phone: [phone], Toll-Free: [phone], Fax: [phone] Page: 2 of 2 Call Id: [id number] [place] Disagree/Comply Comply Caller Understands Yes PreDisposition Call Doctor Care Advice Given Per Guideline SEE PCP WITHIN 24 HOURS: * IF OFFICE WILL BE OPEN: You need to be examined within the next 24 hours. Call your doctor (or NP/PA) when the office opens and make an appointment. CALL BACK IF: * You become worse CARE ADVICE given per Back Pain (Adult) guideline. Comments User: Sallyanne Kuster, RN Date/Time [name] Time): 09/20/2020 4:56:37 PM Currently has some venous stasis ulcers on bilateral legs Referrals REFERRED TO PCP OFFICE

## 2020-09-21 NOTE — Telephone Encounter (Signed)
Is left leg pain coming from a new leg ulcer or does it seem to be coming from back?  Did lasix help with leg swelling/rash?  Ok to use ibuprofen or tylenol as previously even with keflex. Let us know if not improved with this to consider another prednisone course.

## 2020-09-21 NOTE — Telephone Encounter (Addendum)
Spoke with pt asking Dr. Synthia Innocent questions.  Denies leg pain is coming from back.  Pain usually goes from knee to the ankle.  States the Lasix is helping with swelling/rash.  I relayed Dr. Synthia Innocent message.  Pt verbalizes understanding.  Says she is taking meloxicam, which seems to help better.  Occasionally takes Tylenol Arthritis.  Pt will call with an update.   Pt was using Cetaphil gel for a couple of days and thinks that was cause of weeping skin ulcers.  Has stopped use and areas seem to be getting better.

## 2020-09-22 NOTE — Telephone Encounter (Signed)
Pt called stated she spoke to dr Golden Circle NP  They prescribed methylprednisolone     Can she take kelfex along with methylprednisolone

## 2020-09-23 NOTE — Telephone Encounter (Signed)
Advised pt of PCP msg. Pt reports she has contacted pharmacist and they advised the same thing. Pt appreciative and verbalized understanding.

## 2020-09-23 NOTE — Telephone Encounter (Signed)
Ok to take both 

## 2020-10-11 ENCOUNTER — Other Ambulatory Visit: Payer: Self-pay

## 2020-10-11 ENCOUNTER — Ambulatory Visit (INDEPENDENT_AMBULATORY_CARE_PROVIDER_SITE_OTHER): Payer: Medicare Other

## 2020-10-11 ENCOUNTER — Other Ambulatory Visit: Payer: Self-pay | Admitting: Family Medicine

## 2020-10-11 DIAGNOSIS — E559 Vitamin D deficiency, unspecified: Secondary | ICD-10-CM

## 2020-10-11 DIAGNOSIS — D509 Iron deficiency anemia, unspecified: Secondary | ICD-10-CM

## 2020-10-11 DIAGNOSIS — Z0000 Encounter for general adult medical examination without abnormal findings: Secondary | ICD-10-CM | POA: Diagnosis not present

## 2020-10-11 NOTE — Progress Notes (Signed)
Subjective:   [name] is a 73 y.o. female who presents for Medicare Annual (Subsequent) preventive examination.  Review of Systems: N/A      I connected with the patient today by telephone and verified that I am speaking with the correct person using two identifiers. Location patient: home Location nurse: work Persons participating in the telephone visit: patient, nurse.   I discussed the limitations, risks, security and privacy concerns of performing an evaluation and management service by telephone and the availability of in person appointments. I also discussed with the patient that there may be a patient responsible charge related to this service. The patient expressed understanding and verbally consented to this telephonic visit.        Cardiac Risk Factors include: advanced age (>51men, >38 women);hypertension     Objective:    Today's Vitals   10/11/20 1117  PainSc: 4    There is no height or weight on file to calculate BMI.  Advanced Directives 10/11/2020 05/05/2020 04/15/2020 10/08/2019 08/03/2019 06/05/2019 10/01/2018  Does Patient Have a Medical Advance Directive? No No No No No No No  Does patient want to make changes to medical advance directive? - - - - - - -  Copy of [place] in Chart? - - - - - - -  Would patient like information on creating a medical advance directive? Yes (MAU/Ambulatory/Procedural Areas - Information given) No - Patient declined No - Patient declined Yes (MAU/Ambulatory/Procedural Areas - Information given) No - Patient declined No - Patient declined No - Patient declined    Current Medications (verified) Outpatient Encounter Medications as of 10/11/2020  Medication Sig  . acetaminophen (TYLENOL ARTHRITIS PAIN) 650 MG CR tablet Take 1,300 mg by mouth every 8 (eight) hours as needed for pain.   Marland Kitchen albuterol (VENTOLIN HFA) 108 (90 Base) MCG/ACT inhaler INHALE 2 PUFFS BY MOUTH EVERY 6 HORUS AS NEEDED FOR WHEEZING/SHORTNESS OF  BREATH  . aspirin EC 81 MG tablet Take 81 mg by mouth at bedtime. Melanoma prevention  . cephALEXin (KEFLEX) 500 MG capsule Take 1 capsule (500 mg total) by mouth 3 (three) times daily.  . famotidine (PEPCID) 20 MG tablet Take 1 tablet (20 mg total) by mouth at bedtime.  . furosemide (LASIX) 40 MG tablet Take 1 tablet (40 mg total) by mouth 2 (two) times daily as needed for fluid. Take second dose if needed for leg swelling  . loratadine (CLARITIN) 10 MG tablet Take 10 mg by mouth daily.  Marland Kitchen losartan (COZAAR) 25 MG tablet TAKE 2 TABLETS BY MOUTH EVERY DAY  . meloxicam (MOBIC) 15 MG tablet TAKE 1 TABLET BY MOUTH EVERY DAY AS NEEDED FOR PAIN  . montelukast (SINGULAIR) 10 MG tablet TAKE 1 TABLET BY MOUTH EVERY DAY  . Multiple Vitamin (MULTIVITAMIN WITH MINERALS) TABS tablet Take 1 tablet by mouth daily.  . Polyvinyl Alcohol-Povidone (TEARS PLUS OP) Place 1 drop into both eyes daily as needed (dry eyes/ redness/ burning).   . potassium chloride SA (KLOR-CON) 20 MEQ tablet Take 1 tablet (20 mEq total) by mouth 2 (two) times daily.  Marland Kitchen PRESCRIPTION MEDICATION CPAP  . Probiotic Product (PROBIOTIC DAILY PO) Take 1 tablet by mouth daily.   Marland Kitchen triamcinolone (NASACORT ALLERGY 24HR) 55 MCG/ACT AERO nasal inhaler Place 2 sprays into the nose daily. In each nostril  . triamcinolone cream (KENALOG) 0.1 % Apply 1 application topically 2 (two) times daily. Apply to AA. Max 2 weeks at a time.  Marland Kitchen UNABLE  TO FIND Take by mouth.  . Vitamin D, Ergocalciferol, (DRISDOL) 1.25 MG (50000 UNIT) CAPS capsule TAKE 1 CAPSULE (50,000 UNITS TOTAL) BY MOUTH EVERY 7 (SEVEN) DAYS.   No facility-administered encounter medications on file as of 10/11/2020.    Allergies (verified) Sulfa antibiotics, Voltaren [diclofenac sodium], Latex, Tape, Doxycycline, and Other   History: Past Medical History:  Diagnosis Date  . Allergy   . Anesthesia complication    trouble waking up 2/2 CPAP  . Arthritis   . Asthma in adult   . Cataract  2019   left eye resolved with surgery  . Cervical spondylosis 2012   Jacelyn Grip, [facility])  . Choroidal nevus of left eye 03/22/2016  . Chronic venous insufficiency 2016   severe reflux with painful varicosities sees [facility]  . Clotting disorder ([place])    due to vein ablation   . Complication of anesthesia    difficulty coming out due to sleep apnea per pt  . Difficult intubation    PATIENT DENIES   . Diverticulosis    per ct scans 09-2016 and 01-2017  . DVT (deep venous thrombosis) ([place]) 2011   small, developed after venous ablation  . DVT (deep venous thrombosis) ([place])   . Family history of adverse reaction to anesthesia    O2 levels drop upon waking   . Hearing loss sensory, bilateral 2013   mod-severe high freq sensorineural ([facility])  . Hiatal hernia     09-2016 ct scan HP at cancer center   . History of kidney stones 1980s  . History of radiation therapy 07/19/11-08/25/2011   RIGHT AXILLARY REGION/METASTATIC  . Hypertension   . Melanoma ([place]) 06/29/10   MALIGNANT MELANOMA R SHOULDER/SUPRASCAPULAR BACK s/p interferon chemo and XRT  . Neuromuscular disorder ([facility])    muscle spasms  . OSA (obstructive sleep apnea)    on CPAP  . Pneumonia    2001  . RLS (restless legs syndrome)   . Seasonal allergies   . Sleep apnea    wears cpap  . Tinnitus   . Unspecified vitamin D deficiency 03/18/2013  . Varicose veins of left lower extremity    Past Surgical History:  Procedure Laterality Date  . ABI  2016   WNL  . ANTERIOR CERVICAL DECOMP/DISCECTOMY FUSION N/A 08/22/2015   ANTERIOR CERVICAL DECOMPRESSION FUSION C3/4 - interbody graft and anterior plate; Kristeen Miss, MD  . ANTERIOR CERVICAL DECOMP/DISCECTOMY FUSION N/A 07/02/2016   Procedure: Cervical four- five Cervical five- six Cervical six- seven Anterior cervical decompression/diskectomy/fusion;  Surgeon: Kristeen Miss, MD;  Location: [facility];  Service: Neurosurgery;  Laterality: N/A;  C4-5 C5-6 C6-7 Anterior cervical  decompression/diskectomy/fusion  . BREAST BIOPSY Left 2013   BENIGN  . BREAST SURGERY Right 1990   BX   . CARDIOVASCULAR STRESS TEST  2010   normal stress test, EF 66%  . CATARACT EXTRACTION W/ INTRAOCULAR LENS IMPLANT Left 2019   Dr. Kathrin Penner  . COLONOSCOPY  03/2005   benign polyp, int hem, rpt 5 yrs ([place], [place])  . COLONOSCOPY WITH PROPOFOL N/A 01/06/2018   diverticulosis, decreased sphincter tone, no f/u needed [name], Kirke Corin, MD)  . DEEP AXILLARY SENTINEL NODE BIOPSY / EXCISION  2012   RIGHT  . DEXA  06/2016   WNL  . DILATION AND CURETTAGE OF UTERUS  2010  . ENDOVENOUS ABLATION SAPHENOUS VEIN W/ LASER Left 2010   [facility]  . ENDOVENOUS ABLATION SAPHENOUS VEIN W/ LASER Left 2016   accessory branch [facility] Kellie Simmering)  .  ESOPHAGOGASTRODUODENOSCOPY  2006   mod chronic carditis, no H pylori ([place], [place])  . INTRAVASCULAR ULTRASOUND/IVUS N/A 08/03/2019   Waynetta Sandy, [place]   left  . LOWER EXTREMITY VENOGRAPHY Left 08/03/2019   Waynetta Sandy, MD  . LUMBAR LAMINECTOMY  2001   L4/5  . MELANOMA EXCISION  2011   Right shoulder, with sentinel lymph node biopsy  . PERIPHERAL VASCULAR INTERVENTION Left 08/03/2019   Stent of left common iliac vein with 14 x 60 mm Vici Donzetta Matters, [place] Dom, MD)  . PORT-A-CATH REMOVAL  12/05/2011   Procedure: REMOVAL PORT-A-CATH;  Surgeon: Rolm Bookbinder, MD;  Location: [facility];  Service: General;  Laterality: N/A;  left port removal  . PORTACATH PLACEMENT     left subclavian  . UPPER GASTROINTESTINAL ENDOSCOPY     Family History  Problem Relation Age of Onset  . Cancer Mother 70       uterine  . CAD Father 22       MI  . Hypertension Father 62  . Alzheimer's disease Father 16  . Cancer Cousin        x2, breast  . Diabetes Sister   . Cancer Paternal Grandmother        melanoma, possibly  . Colon cancer Neg Hx   . Colon polyps Neg Hx   . Rectal cancer Neg Hx   .  Stomach cancer Neg Hx    Social History   Socioeconomic History  . Marital status: Divorced    Spouse name: Not on file  . Number of children: 0  . Years of education: Not on file  . Highest education level: Not on file  Occupational History  . Occupation: retired    Fish farm manager: RETIRED    Comment: Government social research officer   Tobacco Use  . Smoking status: Former Smoker    Types: Cigarettes    Quit date: 11/05/1965    Years since quitting: 54.9  . Smokeless tobacco: Never Used  . Tobacco comment: socially as a teen  Vaping Use  . Vaping Use: Former  Substance and Sexual Activity  . Alcohol use: No    Alcohol/week: 0.0 standard drinks  . Drug use: No  . Sexual activity: Never  Other Topics Concern  . Not on file  Social History Narrative   Lives alone.  Sister and husband live next door   Lives in [place].   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   Edu: some college   Diet: poor - good water, rare fruits/vegetables   Social Determinants of Health   Financial Resource Strain: Low Risk   . Difficulty of Paying Living Expenses: Not hard at all  Food Insecurity: No Food Insecurity  . Worried About Charity fundraiser in the Last Year: Never true  . Ran Out of Food in the Last Year: Never true  Transportation Needs: No Transportation Needs  . Lack of Transportation (Medical): No  . Lack of Transportation (Non-Medical): No  Physical Activity: Inactive  . Days of Exercise per Week: 0 days  . Minutes of Exercise per Session: 0 min  Stress: No Stress Concern Present  . Feeling of Stress : Not at all  Social Connections:   . Frequency of Communication with Friends and Family: Not on file  . Frequency of Social Gatherings with Friends and Family: Not on file  . Attends Religious Services: Not on file  . Active Member  of Clubs or Organizations: Not on file  . Attends Archivist Meetings: Not on file  . Marital Status: Not on file    Tobacco  Counseling Counseling given: Not Answered Comment: socially as a teen   Clinical Intake:  Pre-visit preparation completed: Yes  Pain : 0-10 Pain Score: 4  Pain Type: Chronic pain Pain Location: Back Pain Orientation: Lower Pain Descriptors / Indicators: Aching Pain Onset: More than a month ago Pain Frequency: Intermittent Pain Relieving Factors: prednisone  Pain Relieving Factors: prednisone  Nutritional Risks: None Diabetes: No  How often do you need to have someone help you when you read instructions, pamphlets, or other written materials from your doctor or pharmacy?: 1 - Never What is the last grade level you completed in school?: some college  Diabetic: No Nutrition Risk Assessment:  Has the patient had any N/V/D within the last 2 months?  No  Does the patient have any non-healing wounds?  No  Has the patient had any unintentional weight loss or weight gain?  No   Diabetes:  Is the patient diabetic?  No  If diabetic, was a CBG obtained today?  N/A Did the patient bring in their glucometer from home?  N/A How often do you monitor your CBG's? N/A.   Financial Strains and Diabetes Management:  Are you having any financial strains with the device, your supplies or your medication? N/A.  Does the patient want to be seen by Chronic Care Management for management of their diabetes?  N/A Would the patient like to be referred to a Nutritionist or for Diabetic Management?  N/A   Interpreter Needed?: No  Information entered by :: CJohnson, LPN   Activities of Daily Living In your present state of health, do you have any difficulty performing the following activities: 10/11/2020  Hearing? N  Vision? N  Difficulty concentrating or making decisions? N  Walking or climbing stairs? N  Dressing or bathing? N  Doing errands, shopping? N  Preparing Food and eating ? N  Using the Toilet? N  In the past six months, have you accidently leaked urine? Y  Comment wears pads   Do you have problems with loss of bowel control? N  Managing your Medications? N  Managing your Finances? N  Housekeeping or managing your Housekeeping? N  Some recent data might be hidden    Patient Care Team: [name], MD as PCP - General (Family Medicine) Marin Olp Rudell Cobb, MD as Consulting Physician (Oncology) Kristeen Miss, MD as Consulting Physician (Neurosurgery) Danella Sensing, MD as Consulting Physician (Dermatology) Garrel Ridgel, [place] as Consulting Physician (Podiatry) Juanito Doom, MD as Consulting Physician (Pulmonary Disease) Shon Hough, MD as Consulting Physician (Ophthalmology)  Indicate any recent Medical Services you may have received from other than [facility] providers in the past year (date may be approximate).     Assessment:   This is a routine wellness examination for [place].  Hearing/Vision screen  Hearing Screening   125Hz  250Hz  500Hz  1000Hz  2000Hz  3000Hz  4000Hz  6000Hz  8000Hz   Right ear:           Left ear:           Vision Screening Comments: Patient gets annual eye exams   Dietary issues and exercise activities discussed: Current Exercise Habits: The patient does not participate in regular exercise at present, Exercise limited by: None identified  Goals    . DIET - INCREASE WATER INTAKE     Starting 10/01/2018, I will continue to  drink at least 5-8 glasses of water daily.     . Patient Stated     10/08/2019, I will maintain and continue medications as prescribed.     . Patient Stated     10/11/2020, I will maintain and continue medications as prescribed.       Depression Screen PHQ 2/9 Scores 10/11/2020 08/22/2020 10/08/2019 10/01/2018 09/25/2017 09/24/2016 09/23/2015  PHQ - 2 Score 0 0 0 0 0 0 0  PHQ- 9 Score 0 - 0 0 0 - -    Fall Risk Fall Risk  10/11/2020 08/22/2020 10/08/2019 10/01/2018 09/23/2018  Falls in the past year? 1 1 1  0 0  Comment tripped and fell - has sores on her leg and has wrappings and a special shoe that  tripped her over - Emmi Telephone Survey: data to providers prior to load  Number falls in past yr: 0 0 1 - -  Injury with Fall? 0 0 0 - -  Risk for fall due to : Medication side effect - Impaired mobility;Impaired balance/gait;Medication side effect - -  Follow up Falls evaluation completed;Falls prevention discussed Falls evaluation completed Falls evaluation completed;Falls prevention discussed - -    FALL RISK PREVENTION PERTAINING TO THE HOME:  Any stairs in or around the home? Yes  If so, are there any without handrails? No  Home free of loose throw rugs in walkways, pet beds, electrical cords, etc? Yes  Adequate lighting in your home to reduce risk of falls? Yes   ASSISTIVE DEVICES UTILIZED TO PREVENT FALLS:  Life alert? No  Use of a cane, walker or w/c? No  Grab bars in the bathroom? No  Shower chair or bench in shower? No  Elevated toilet seat or a handicapped toilet? No   TIMED UP AND GO:  Was the test performed? N/A, telephone visit .    Cognitive Function: MMSE - Mini Mental State Exam 10/11/2020 10/08/2019 10/01/2018 09/25/2017 09/24/2016  Orientation to time 5 5 5 5 5   Orientation to Place 5 5 5 5 5   Registration 3 3 3 3 3   Attention/ Calculation 5 5 0 0 0  Recall 3 3 3 3 3   Language- name 2 objects - - 0 0 0  Language- repeat 1 1 1 1 1   Language- follow 3 step command - - 3 3 3   Language- read & follow direction - - 0 0 0  Write a sentence - - 0 0 0  Copy design - - 0 0 0  Total score - - 20 20 20   Mini Cog  Mini-Cog screen was completed. Maximum score is 22. A value of 0 denotes this part of the MMSE was not completed or the patient failed this part of the Mini-Cog screening.       Immunizations Immunization History  Administered Date(s) Administered  . Fluad Quad(high Dose 65+) 10/14/2019, 08/22/2020  . Influenza, High Dose Seasonal PF 08/13/2016, 07/31/2017, 07/28/2018  . Influenza, Seasonal, Injecte, Preservative Fre 09/01/2009  .  Influenza,inj,Quad PF,6+ Mos 08/06/2013, 09/17/2014, 08/18/2015  . Influenza-Unspecified 08/23/2019  . Moderna SARS-COVID-2 Vaccination 02/16/2020, 03/17/2020  . Pneumococcal Conjugate-13 09/22/2014  . Pneumococcal Polysaccharide-23 05/15/2013  . Td 10/14/2019    TDAP status: Up to date  Flu Vaccine status: Up to date  Pneumococcal vaccine status: Up to date  Covid-19 vaccine status: Completed vaccines  Qualifies for Shingles Vaccine? Yes   Zostavax completed No   Shingrix Completed?: No.    Education has been provided regarding the importance  of this vaccine. Patient has been advised to call insurance company to determine out of pocket expense if they have not yet received this vaccine. Advised may also receive vaccine at local pharmacy or Health Dept. Verbalized acceptance and understanding.  Screening Tests Health Maintenance  Topic Date Due  . MAMMOGRAM  05/03/2021  . COLONOSCOPY  01/07/2028  . TETANUS/TDAP  10/13/2029  . INFLUENZA VACCINE  Completed  . DEXA SCAN  Completed  . COVID-19 Vaccine  Completed  . Hepatitis C Screening  Completed  . PNA vac Low Risk Adult  Completed    Health Maintenance  There are no preventive care reminders to display for this patient.  Colorectal cancer screening: Type of screening: Colonoscopy. Completed 01/06/2018. Repeat every 10 years  Mammogram status: Completed 05/03/2020. Repeat every year  Bone Density status: due, will discuss with provider first at physical   Lung Cancer Screening: (Low Dose CT Chest recommended if Age 88-80 years, 30 pack-year currently smoking OR have quit w/in 15 years.) does not qualify.    Additional Screening:  Hepatitis C Screening: does qualify; Completed 09/19/2015  Vision Screening: Recommended annual ophthalmology exams for early detection of glaucoma and other disorders of the eye. Is the patient up to date with their annual eye exam?  Yes  Who is the provider or what is the name of the office  in which the patient attends annual eye exams? Dr. Kathrin Penner If pt is not established with a provider, would they like to be referred to a provider to establish care? No .   Dental Screening: Recommended annual dental exams for proper oral hygiene  Community Resource Referral / Chronic Care Management: CRR required this visit?  No   CCM required this visit?  No      Plan:     I have personally reviewed and noted the following in the patient's chart:   . Medical and social history . Use of alcohol, tobacco or illicit drugs  . Current medications and supplements . Functional ability and status . Nutritional status . Physical activity . Advanced directives . List of other physicians . Hospitalizations, surgeries, and ER visits in previous 12 months . Vitals . Screenings to include cognitive, depression, and falls . Referrals and appointments  In addition, I have reviewed and discussed with patient certain preventive protocols, quality metrics, and best practice recommendations. A written personalized care plan for preventive services as well as general preventive health recommendations were provided to patient.   Due to this being a telephonic visit, the after visit summary with patients personalized plan was offered to patient via office or my-chart.  Patient preferred to pick up at office at next visit or via mychart.   Andrez Grime, LPN   07/10/7095

## 2020-10-11 NOTE — Patient Instructions (Signed)
[name] , Thank you for taking time to come for your Medicare Wellness Visit. I appreciate your ongoing commitment to your health goals. Please review the following plan we discussed and let me know if I can assist you in the future.   Screening recommendations/referrals: Colonoscopy: Up to date, completed 01/06/2018, due 01/2028 Mammogram: Up to date, completed 05/03/2020, due 04/2021 Bone Density: due, will discuss with provider first at physical  Recommended yearly ophthalmology/optometry visit for glaucoma screening and checkup Recommended yearly dental visit for hygiene and checkup  Vaccinations: Influenza vaccine: Up to date, completed 08/22/2020, due 06/2021 Pneumococcal vaccine: Completed series Tdap vaccine: Up to date, completed 10/14/2019, due 10/2029 Shingles vaccine: due, check with your insurance regarding coverage/cost if interested    Covid-19:Completed series  Advanced directives: Advance directive discussed with you today. I have provided a copy for you to complete at home and have notarized. Once this is complete please bring a copy in to our office so we can scan it into your chart.  Conditions/risks identified: hypertension  Next appointment: Follow up in one year for your annual wellness visit    Preventive Care 74 Years and Older, Female Preventive care refers to lifestyle choices and visits with your health care provider that can promote health and wellness. What does preventive care include?  A yearly physical exam. This is also called an annual well check.  Dental exams once or twice a year.  Routine eye exams. Ask your health care provider how often you should have your eyes checked.  Personal lifestyle choices, including:  Daily care of your teeth and gums.  Regular physical activity.  Eating a healthy diet.  Avoiding tobacco and drug use.  Limiting alcohol use.  Practicing safe sex.  Taking low-dose aspirin every day.  Taking vitamin and  mineral supplements as recommended by your health care provider. What happens during an annual well check? The services and screenings done by your health care provider during your annual well check will depend on your age, overall health, lifestyle risk factors, and family history of disease. Counseling  Your health care provider may ask you questions about your:  Alcohol use.  Tobacco use.  Drug use.  Emotional well-being.  Home and relationship well-being.  Sexual activity.  Eating habits.  History of falls.  Memory and ability to understand (cognition).  Work and work Statistician.  Reproductive health. Screening  You may have the following tests or measurements:  Height, weight, and BMI.  Blood pressure.  Lipid and cholesterol levels. These may be checked every 5 years, or more frequently if you are over 13 years old.  Skin check.  Lung cancer screening. You may have this screening every year starting at age 67 if you have a 30-pack-year history of smoking and currently smoke or have quit within the past 15 years.  Fecal occult blood test (FOBT) of the stool. You may have this test every year starting at age 48.  Flexible sigmoidoscopy or colonoscopy. You may have a sigmoidoscopy every 5 years or a colonoscopy every 10 years starting at age 27.  Hepatitis C blood test.  Hepatitis B blood test.  Sexually transmitted disease (STD) testing.  Diabetes screening. This is done by checking your blood sugar (glucose) after you have not eaten for a while (fasting). You may have this done every 1-3 years.  Bone density scan. This is done to screen for osteoporosis. You may have this done starting at age 4.  Mammogram. This may be  done every 1-2 years. Talk to your health care provider about how often you should have regular mammograms. Talk with your health care provider about your test results, treatment options, and if necessary, the need for more tests. Vaccines   Your health care provider may recommend certain vaccines, such as:  Influenza vaccine. This is recommended every year.  Tetanus, diphtheria, and acellular pertussis (Tdap, Td) vaccine. You may need a Td booster every 10 years.  Zoster vaccine. You may need this after age 48.  Pneumococcal 13-valent conjugate (PCV13) vaccine. One dose is recommended after age 71.  Pneumococcal polysaccharide (PPSV23) vaccine. One dose is recommended after age 26. Talk to your health care provider about which screenings and vaccines you need and how often you need them. This information is not intended to replace advice given to you by your health care provider. Make sure you discuss any questions you have with your health care provider. Document Released: 11/18/2015 Document Revised: 07/11/2016 Document Reviewed: 08/23/2015 Elsevier Interactive Patient Education  2017 [place] Prevention in the Home Falls can cause injuries. They can happen to people of all ages. There are many things you can do to make your home safe and to help prevent falls. What can I do on the outside of my home?  Regularly fix the edges of walkways and driveways and fix any cracks.  Remove anything that might make you trip as you walk through a door, such as a raised step or threshold.  Trim any bushes or trees on the path to your home.  Use bright outdoor lighting.  Clear any walking paths of anything that might make someone trip, such as rocks or tools.  Regularly check to see if handrails are loose or broken. Make sure that both sides of any steps have handrails.  Any raised decks and porches should have guardrails on the edges.  Have any leaves, snow, or ice cleared regularly.  Use sand or salt on walking paths during winter.  Clean up any spills in your garage right away. This includes oil or grease spills. What can I do in the bathroom?  Use night lights.  Install grab bars by the toilet and in the  tub and shower. Do not use towel bars as grab bars.  Use non-skid mats or decals in the tub or shower.  If you need to sit down in the shower, use a plastic, non-slip stool.  Keep the floor dry. Clean up any water that spills on the floor as soon as it happens.  Remove soap buildup in the tub or shower regularly.  Attach bath mats securely with double-sided non-slip rug tape.  Do not have throw rugs and other things on the floor that can make you trip. What can I do in the bedroom?  Use night lights.  Make sure that you have a light by your bed that is easy to reach.  Do not use any sheets or blankets that are too big for your bed. They should not hang down onto the floor.  Have a firm chair that has side arms. You can use this for support while you get dressed.  Do not have throw rugs and other things on the floor that can make you trip. What can I do in the kitchen?  Clean up any spills right away.  Avoid walking on wet floors.  Keep items that you use a lot in easy-to-reach places.  If you need to reach something above you, use  a strong step stool that has a grab bar.  Keep electrical cords out of the way.  Do not use floor polish or wax that makes floors slippery. If you must use wax, use non-skid floor wax.  Do not have throw rugs and other things on the floor that can make you trip. What can I do with my stairs?  Do not leave any items on the stairs.  Make sure that there are handrails on both sides of the stairs and use them. Fix handrails that are broken or loose. Make sure that handrails are as long as the stairways.  Check any carpeting to make sure that it is firmly attached to the stairs. Fix any carpet that is loose or worn.  Avoid having throw rugs at the top or bottom of the stairs. If you do have throw rugs, attach them to the floor with carpet tape.  Make sure that you have a light switch at the top of the stairs and the bottom of the stairs. If you  do not have them, ask someone to add them for you. What else can I do to help prevent falls?  Wear shoes that:  Do not have high heels.  Have rubber bottoms.  Are comfortable and fit you well.  Are closed at the toe. Do not wear sandals.  If you use a stepladder:  Make sure that it is fully opened. Do not climb a closed stepladder.  Make sure that both sides of the stepladder are locked into place.  Ask someone to hold it for you, if possible.  Clearly mark and make sure that you can see:  Any grab bars or handrails.  First and last steps.  Where the edge of each step is.  Use tools that help you move around (mobility aids) if they are needed. These include:  Canes.  Walkers.  Scooters.  Crutches.  Turn on the lights when you go into a dark area. Replace any light bulbs as soon as they burn out.  Set up your furniture so you have a clear path. Avoid moving your furniture around.  If any of your floors are uneven, fix them.  If there are any pets around you, be aware of where they are.  Review your medicines with your doctor. Some medicines can make you feel dizzy. This can increase your chance of falling. Ask your doctor what other things that you can do to help prevent falls. This information is not intended to replace advice given to you by your health care provider. Make sure you discuss any questions you have with your health care provider. Document Released: 08/18/2009 Document Revised: 03/29/2016 Document Reviewed: 11/26/2014 Elsevier Interactive Patient Education  2017 [employer].

## 2020-10-11 NOTE — Progress Notes (Signed)
PCP notes:  Health Maintenance: Dexa- due   Abnormal Screenings: none   Patient concerns: none   Nurse concerns: none   Next PCP appt: 10/17/2020 @ 11:30 am

## 2020-10-12 ENCOUNTER — Other Ambulatory Visit (INDEPENDENT_AMBULATORY_CARE_PROVIDER_SITE_OTHER): Payer: Medicare Other

## 2020-10-12 ENCOUNTER — Other Ambulatory Visit: Payer: Self-pay

## 2020-10-12 DIAGNOSIS — E559 Vitamin D deficiency, unspecified: Secondary | ICD-10-CM

## 2020-10-12 DIAGNOSIS — D509 Iron deficiency anemia, unspecified: Secondary | ICD-10-CM

## 2020-10-12 LAB — CBC WITH DIFFERENTIAL/PLATELET
Basophils Absolute: 0 10*3/uL (ref 0.0–0.1)
Basophils Relative: 0.2 % (ref 0.0–3.0)
Eosinophils Absolute: 0 10*3/uL (ref 0.0–0.7)
Eosinophils Relative: 0.5 % (ref 0.0–5.0)
HCT: 41.9 % (ref 36.0–46.0)
Hemoglobin: 13.7 g/dL (ref 12.0–15.0)
Lymphocytes Relative: 19.2 % (ref 12.0–46.0)
Lymphs Abs: 1.5 10*3/uL (ref 0.7–4.0)
MCHC: 32.6 g/dL (ref 30.0–36.0)
MCV: 89 fl (ref 78.0–100.0)
Monocytes Absolute: 0.4 10*3/uL (ref 0.1–1.0)
Monocytes Relative: 5 % (ref 3.0–12.0)
Neutro Abs: 6 10*3/uL (ref 1.4–7.7)
Neutrophils Relative %: 75.1 % (ref 43.0–77.0)
Platelets: 229 10*3/uL (ref 150.0–400.0)
RBC: 4.71 Mil/uL (ref 3.87–5.11)
RDW: 14.4 % (ref 11.5–15.5)
WBC: 8 10*3/uL (ref 4.0–10.5)

## 2020-10-12 LAB — IBC PANEL
Iron: 59 ug/dL (ref 42–145)
Saturation Ratios: 16.7 % — ABNORMAL LOW (ref 20.0–50.0)
Transferrin: 253 mg/dL (ref 212.0–360.0)

## 2020-10-12 LAB — VITAMIN D 25 HYDROXY (VIT D DEFICIENCY, FRACTURES): VITD: 44.84 ng/mL (ref 30.00–100.00)

## 2020-10-12 LAB — FERRITIN: Ferritin: 51.2 ng/mL (ref 10.0–291.0)

## 2020-10-17 ENCOUNTER — Other Ambulatory Visit: Payer: Self-pay

## 2020-10-17 ENCOUNTER — Encounter: Payer: Self-pay | Admitting: Family Medicine

## 2020-10-17 ENCOUNTER — Ambulatory Visit (INDEPENDENT_AMBULATORY_CARE_PROVIDER_SITE_OTHER): Payer: Medicare Other | Admitting: Family Medicine

## 2020-10-17 VITALS — BP 134/82 | HR 82 | Temp 97.6°F | Ht 62.0 in | Wt 214.1 lb

## 2020-10-17 DIAGNOSIS — I83893 Varicose veins of bilateral lower extremities with other complications: Secondary | ICD-10-CM | POA: Diagnosis not present

## 2020-10-17 DIAGNOSIS — D509 Iron deficiency anemia, unspecified: Secondary | ICD-10-CM | POA: Diagnosis not present

## 2020-10-17 DIAGNOSIS — E559 Vitamin D deficiency, unspecified: Secondary | ICD-10-CM | POA: Diagnosis not present

## 2020-10-17 DIAGNOSIS — I872 Venous insufficiency (chronic) (peripheral): Secondary | ICD-10-CM

## 2020-10-17 DIAGNOSIS — G4733 Obstructive sleep apnea (adult) (pediatric): Secondary | ICD-10-CM | POA: Diagnosis not present

## 2020-10-17 DIAGNOSIS — G2581 Restless legs syndrome: Secondary | ICD-10-CM

## 2020-10-17 DIAGNOSIS — C4361 Malignant melanoma of right upper limb, including shoulder: Secondary | ICD-10-CM | POA: Diagnosis not present

## 2020-10-17 DIAGNOSIS — Z9989 Dependence on other enabling machines and devices: Secondary | ICD-10-CM

## 2020-10-17 DIAGNOSIS — I871 Compression of vein: Secondary | ICD-10-CM | POA: Diagnosis not present

## 2020-10-17 DIAGNOSIS — M40202 Unspecified kyphosis, cervical region: Secondary | ICD-10-CM

## 2020-10-17 DIAGNOSIS — Z7189 Other specified counseling: Secondary | ICD-10-CM

## 2020-10-17 DIAGNOSIS — I10 Essential (primary) hypertension: Secondary | ICD-10-CM | POA: Diagnosis not present

## 2020-10-17 NOTE — Assessment & Plan Note (Signed)
Chronic, stable. Continue current regimen. 

## 2020-10-17 NOTE — Patient Instructions (Addendum)
If interested, check with pharmacy about new 2 shot shingles series (shingrix). Bring [place] copy of your advanced directive to update your chart.  You are doing well today Return as needed or in 6 months for follow up visit.   Health Maintenance After Age 74 After age 68, you are at a higher risk for certain long-term diseases and infections as well as injuries from falls. Falls are a major cause of broken bones and head injuries in people who are older than age 58. Getting regular preventive care can help to keep you healthy and well. Preventive care includes getting regular testing and making lifestyle changes as recommended by your health care provider. Talk with your health care provider about:  Which screenings and tests you should have. A screening is a test that checks for a disease when you have no symptoms.  A diet and exercise plan that is right for you. What should I know about screenings and tests to prevent falls? Screening and testing are the best ways to find a health problem early. Early diagnosis and treatment give you the best chance of managing medical conditions that are common after age 41. Certain conditions and lifestyle choices may make you more likely to have a fall. Your health care provider may recommend:  Regular vision checks. Poor vision and conditions such as cataracts can make you more likely to have a fall. If you wear glasses, make sure to get your prescription updated if your vision changes.  Medicine review. Work with your health care provider to regularly review all of the medicines you are taking, including over-the-counter medicines. Ask your health care provider about any side effects that may make you more likely to have a fall. Tell your health care provider if any medicines that you take make you feel dizzy or sleepy.  Osteoporosis screening. Osteoporosis is a condition that causes the bones to get weaker. This can make the bones weak and cause them to break  more easily.  Blood pressure screening. Blood pressure changes and medicines to control blood pressure can make you feel dizzy.  Strength and balance checks. Your health care provider may recommend certain tests to check your strength and balance while standing, walking, or changing positions.  Foot health exam. Foot pain and numbness, as well as not wearing proper footwear, can make you more likely to have a fall.  Depression screening. You may be more likely to have a fall if you have a fear of falling, feel emotionally low, or feel unable to do activities that you used to do.  Alcohol use screening. Using too much alcohol can affect your balance and may make you more likely to have a fall. What actions can I take to lower my risk of falls? General instructions  Talk with your health care provider about your risks for falling. Tell your health care provider if: ? You fall. Be sure to tell your health care provider about all falls, even ones that seem minor. ? You feel dizzy, sleepy, or off-balance.  Take over-the-counter and prescription medicines only as told by your health care provider. These include any supplements.  Eat a healthy diet and maintain a healthy weight. A healthy diet includes low-fat dairy products, low-fat (lean) meats, and fiber from whole grains, beans, and lots of fruits and vegetables. Home safety  Remove any tripping hazards, such as rugs, cords, and clutter.  Install safety equipment such as grab bars in bathrooms and safety rails on stairs.  Keep  rooms and walkways well-lit. Activity   Follow a regular exercise program to stay fit. This will help you maintain your balance. Ask your health care provider what types of exercise are appropriate for you.  If you need a cane or walker, use it as recommended by your health care provider.  Wear supportive shoes that have nonskid soles. Lifestyle  Do not drink alcohol if your health care provider tells you not  to drink.  If you drink alcohol, limit how much you have: ? 0-1 drink a day for women. ? 0-2 drinks a day for men.  Be aware of how much alcohol is in your drink. In the [place] one drink equals one typical bottle of beer (12 oz), one-half glass of wine (5 oz), or one shot of hard liquor (1 oz).  Do not use any products that contain nicotine or tobacco, such as cigarettes and e-cigarettes. If you need help quitting, ask your health care provider. Summary  Having a healthy lifestyle and getting preventive care can help to protect your health and wellness after age 73.  Screening and testing are the best way to find a health problem early and help you avoid having a fall. Early diagnosis and treatment give you the best chance for managing medical conditions that are more common for people who are older than age 97.  Falls are a major cause of broken bones and head injuries in people who are older than age 71. Take precautions to prevent a fall at home.  Work with your health care provider to learn what changes you can make to improve your health and wellness and to prevent falls. This information is not intended to replace advice given to you by your health care provider. Make sure you discuss any questions you have with your health care provider. Document Revised: 02/12/2019 Document Reviewed: 09/04/2017 Elsevier Patient Education  2020 [employer].

## 2020-10-17 NOTE — Assessment & Plan Note (Signed)
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 Packet previously provided.

## 2020-10-17 NOTE — Progress Notes (Addendum)
Patient ID: [name], female    DOB: 30-Sep-1946, 74 y.o.   MRN: [id number]  This visit was conducted in person.  BP 134/82 (BP Location: Left Arm, Patient Position: Sitting, Cuff Size: Large)   Pulse 82   Temp 97.6 F (36.4 C) (Temporal)   Ht 5\' 2"  (1.575 m)   Wt 214 lb 1 oz (97.1 kg)   SpO2 97%   BMI 39.15 kg/m    CC: CPE Subjective:   HPI: [name] is a 74 y.o. female presenting on 10/17/2020 for Annual Exam (Prt 2. )   Saw health advisor last week for medicare wellness visit. Note reviewed.   No exam data present  Flowsheet Row Clinical Support from 10/11/2020 in [facility]  PHQ-2 Total Score 0      Fall Risk  10/11/2020 08/22/2020 10/08/2019 10/01/2018 09/23/2018  Falls in the past year? 1 1 1  0 0  Comment tripped and fell - has sores on her leg and has wrappings and a special shoe that tripped her over - Emmi Telephone Survey: data to providers prior to load  Number falls in past yr: 0 0 1 - -  Injury with Fall? 0 0 0 - -  Risk for fall due to : Medication side effect - Impaired mobility;Impaired balance/gait;Medication side effect - -  Follow up Falls evaluation completed;Falls prevention discussed Falls evaluation completed Falls evaluation completed;Falls prevention discussed - -  Fall without injury months ago. Doesn't use cane regularly.   Currently on prednisone course for lumbar disc issue ([place]). On day 11/12 - this is not really helping this time. Considering vaccine.  She also completed keflex course for venous stasis ulcer last month.   Preventative: COLONOSCOPY WITH PROPOFOL 01/06/2018-diverticulosis, decreased sphincter tone, no f/u needed [name], Kirke Corin, MD) Well woman exam with Abel Presto last seen 05/2020  Mammogram yearly 04/2020 at [place] Date:8/2017[facility].  Flu shot - yearly COVID vaccine Moderna 02/2020, 03/2020, planning on getting booster.  Td 2020 Pneumovax 05/2013 , prevnar  09/2014 shingrix - discussed, declines. H/o shingles. 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 Packet previously provided. Seat belt use discussed. Sunscreen use discussed. No changing moles on skin. Sees dermand oncyearly (h/o melanoma). Ex smoker.  Alcohol - none  Dentist q6 mo (due) Eye exam yearly, watching R cataract  Bowel - no constipation  Bladder - no incontinence   Lives alone. Sister and husband live next door  Lives in [place].  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  Edu: some college Activity: no regular exercise Diet: poor - good water, rare fruits/vegetables     Relevant past medical, surgical, family and social history reviewed and updated as indicated. Interim medical history since our last visit reviewed. Allergies and medications reviewed and updated. Outpatient Medications Prior to Visit  Medication Sig Dispense Refill  . acetaminophen (TYLENOL) 650 MG CR tablet Take 1,300 mg by mouth every 8 (eight) hours as needed for pain.     Marland Kitchen albuterol (VENTOLIN HFA) 108 (90 Base) MCG/ACT inhaler INHALE 2 PUFFS BY MOUTH EVERY 6 HORUS AS NEEDED FOR WHEEZING/SHORTNESS OF BREATH 8.5 g 3  . aspirin EC 81 MG tablet Take 81 mg by mouth at bedtime. Melanoma prevention    . cephALEXin (KEFLEX) 500 MG capsule Take 1 capsule (500 mg total) by mouth 3 (three) times daily. 21 capsule 0  . famotidine (PEPCID) 20  MG tablet Take 1 tablet (20 mg total) by mouth at bedtime.    . furosemide (LASIX) 40 MG tablet Take 1 tablet (40 mg total) by mouth 2 (two) times daily as needed for fluid. Take second dose if needed for leg swelling 60 tablet 6  . loratadine (CLARITIN) 10 MG tablet Take 10 mg by mouth daily.    Marland Kitchen losartan (COZAAR) 25 MG tablet TAKE 2 TABLETS BY MOUTH EVERY DAY 60 tablet 5  . meloxicam (MOBIC) 15 MG tablet TAKE 1 TABLET BY MOUTH EVERY DAY AS NEEDED FOR PAIN 30 tablet 1  . montelukast (SINGULAIR)  10 MG tablet TAKE 1 TABLET BY MOUTH EVERY DAY 30 tablet 3  . Multiple Vitamin (MULTIVITAMIN WITH MINERALS) TABS tablet Take 1 tablet by mouth daily.    . Polyvinyl Alcohol-Povidone (TEARS PLUS OP) Place 1 drop into both eyes daily as needed (dry eyes/ redness/ burning).     . potassium chloride SA (KLOR-CON) 20 MEQ tablet Take 1 tablet (20 mEq total) by mouth 2 (two) times daily. 180 tablet 3  . predniSONE (STERAPRED UNI-PAK 48 TAB) 5 MG (48) TBPK tablet Take by mouth.    Marland Kitchen PRESCRIPTION MEDICATION CPAP    . Probiotic Product (PROBIOTIC DAILY PO) Take 1 tablet by mouth daily.     Marland Kitchen triamcinolone (NASACORT) 55 MCG/ACT AERO nasal inhaler Place 2 sprays into the nose daily. In each nostril    . triamcinolone cream (KENALOG) 0.1 % Apply 1 application topically 2 (two) times daily. Apply to AA. Max 2 weeks at a time. 80 g 1  . UNABLE TO FIND Take by mouth.    . Vitamin D, Ergocalciferol, (DRISDOL) 1.25 MG (50000 UNIT) CAPS capsule TAKE 1 CAPSULE (50,000 UNITS TOTAL) BY MOUTH EVERY 7 (SEVEN) DAYS. 12 capsule 3   No facility-administered medications prior to visit.     Per HPI unless specifically indicated in ROS section below Review of Systems Objective:  BP 134/82 (BP Location: Left Arm, Patient Position: Sitting, Cuff Size: Large)   Pulse 82   Temp 97.6 F (36.4 C) (Temporal)   Ht 5\' 2"  (1.575 m)   Wt 214 lb 1 oz (97.1 kg)   SpO2 97%   BMI 39.15 kg/m   Wt Readings from Last 3 Encounters:  10/17/20 214 lb 1 oz (97.1 kg)  09/16/20 225 lb 4 oz (102.2 kg)  08/22/20 228 lb 9.6 oz (103.7 kg)      Physical Exam Vitals and nursing note reviewed.  Constitutional:      General: She is not in acute distress.    Appearance: Normal appearance. She is well-developed and well-nourished. She is not ill-appearing.  HENT:     Head: Normocephalic and atraumatic.     Right Ear: Hearing, tympanic membrane, ear canal and external ear normal.     Left Ear: Hearing, tympanic membrane, ear canal and  external ear normal.     Mouth/Throat:     Mouth: Oropharynx is clear and moist and mucous membranes are normal.     Pharynx: No posterior oropharyngeal edema.  Eyes:     General: No scleral icterus.    Extraocular Movements: Extraocular movements intact and EOM normal.     Conjunctiva/sclera: Conjunctivae normal.     Pupils: Pupils are equal, round, and reactive to light.  Neck:     Thyroid: No thyroid mass or thyromegaly.     Vascular: No carotid bruit.  Cardiovascular:     Rate and Rhythm: Normal rate and regular  rhythm.     Pulses: Normal pulses and intact distal pulses.          Radial pulses are 2+ on the right side and 2+ on the left side.     Heart sounds: Murmur (2/6 systolic USB) heard.    Pulmonary:     Effort: Pulmonary effort is normal. No respiratory distress.     Breath sounds: Normal breath sounds. No wheezing, rhonchi or rales.  Abdominal:     General: Abdomen is flat. Bowel sounds are normal. There is no distension.     Palpations: Abdomen is soft. There is no mass.     Tenderness: There is no abdominal tenderness. There is no guarding or rebound.     Hernia: No hernia is present.  Musculoskeletal:        General: No edema. Normal range of motion.     Cervical back: Normal range of motion and neck supple.     Right lower leg: No edema.     Left lower leg: No edema.  Lymphadenopathy:     Cervical: No cervical adenopathy.  Skin:    General: Skin is warm and dry.     Findings: No rash.  Neurological:     General: No focal deficit present.     Mental Status: She is alert and oriented to person, place, and time.     Comments: CN grossly intact, station and gait intact  Psychiatric:        Mood and Affect: Mood and affect and mood normal.        Behavior: Behavior normal.        Thought Content: Thought content normal.        Judgment: Judgment normal.       Results for orders placed or performed in visit on 10/12/20  CBC with Differential/Platelet   Result Value Ref Range   WBC 8.0 4.0 - 10.5 K/uL   RBC 4.71 3.87 - 5.11 Mil/uL   Hemoglobin 13.7 12.0 - 15.0 g/dL   HCT 41.9 36.0 - 46.0 %   MCV 89.0 78.0 - 100.0 fl   MCHC 32.6 30.0 - 36.0 g/dL   RDW 14.4 11.5 - 15.5 %   Platelets 229.0 150.0 - 400.0 K/uL   Neutrophils Relative % 75.1 43.0 - 77.0 %   Lymphocytes Relative 19.2 12.0 - 46.0 %   Monocytes Relative 5.0 3.0 - 12.0 %   Eosinophils Relative 0.5 0.0 - 5.0 %   Basophils Relative 0.2 0.0 - 3.0 %   Neutro Abs 6.0 1.4 - 7.7 K/uL   Lymphs Abs 1.5 0.7 - 4.0 K/uL   Monocytes Absolute 0.4 0.1 - 1.0 K/uL   Eosinophils Absolute 0.0 0.0 - 0.7 K/uL   Basophils Absolute 0.0 0.0 - 0.1 K/uL  Ferritin  Result Value Ref Range   Ferritin 51.2 10.0 - 291.0 ng/mL  IBC panel  Result Value Ref Range   Iron 59 42 - 145 ug/dL   Transferrin 253.0 212.0 - 360.0 mg/dL   Saturation Ratios 16.7 (L) 20.0 - 50.0 %  VITAMIN D 25 Hydroxy (Vit-D Deficiency, Fractures)  Result Value Ref Range   VITD 44.84 30.00 - 100.00 ng/mL   *Note: Due to a large number of results and/or encounters for the requested time period, some results have not been displayed. A complete set of results can be found in Results Review.   Assessment & Plan:  This visit occurred during the SARS-CoV-2 public health emergency.  Safety protocols were in  place, including screening questions prior to the visit, additional usage of staff PPE, and extensive cleaning of exam room while observing appropriate contact time as indicated for disinfecting solutions.   Problem List Items Addressed This Visit    Vitamin D deficiency    Continue vit D replacement.       Varicose veins of both lower extremities with complications   RESOLVED: RLS (restless legs syndrome)    Found to not have RLS - will resolve.       OSA on CPAP   Melanoma ([place])    Regularly sees derm and onc.       Relevant Medications   predniSONE (STERAPRED UNI-PAK 48 TAB) 5 MG (48) TBPK tablet   May-Thurner  syndrome   Kyphosis of cervical region   RESOLVED: IDA (iron deficiency anemia)    Iron levels stable off replacement.       Hypertension    Chronic, stable. Continue current regimen.       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 Packet previously provided.          No orders of the defined types were placed in this encounter.  No orders of the defined types were placed in this encounter.   Patient instructions: If interested, check with pharmacy about new 2 shot shingles series (shingrix). Bring [place] copy of your advanced directive to update your chart.  You are doing well today Return as needed or in 6 months for follow up visit.   Follow up plan: Return in about 6 months (around 04/17/2021) for follow up visit.  [name], MD

## 2020-10-17 NOTE — Assessment & Plan Note (Addendum)
Found to not have RLS - will resolve.

## 2020-10-17 NOTE — Assessment & Plan Note (Signed)
Continue vit D replacement.  

## 2020-10-17 NOTE — Assessment & Plan Note (Signed)
Iron levels stable off replacement.

## 2020-10-17 NOTE — Assessment & Plan Note (Signed)
Regularly sees derm and onc.

## 2020-10-20 ENCOUNTER — Ambulatory Visit (INDEPENDENT_AMBULATORY_CARE_PROVIDER_SITE_OTHER): Payer: Medicare Other | Admitting: Vascular Surgery

## 2020-10-20 ENCOUNTER — Other Ambulatory Visit: Payer: Self-pay

## 2020-10-20 ENCOUNTER — Encounter: Payer: Self-pay | Admitting: Vascular Surgery

## 2020-10-20 VITALS — BP 143/80 | HR 79 | Temp 97.5°F | Resp 18 | Ht 62.0 in | Wt 214.0 lb

## 2020-10-20 DIAGNOSIS — I872 Venous insufficiency (chronic) (peripheral): Secondary | ICD-10-CM

## 2020-10-20 DIAGNOSIS — I871 Compression of vein: Secondary | ICD-10-CM

## 2020-10-20 NOTE — Progress Notes (Signed)
REASON FOR VISIT:   Follow-up of chronic venous insufficiency  MEDICAL ISSUES:   CHRONIC VENOUS INSUFFICIENCY: This patient has CEAP C5 venous disease.  She has no symptoms on the right side.  She does have superficial venous reflux in the right great saphenous vein.  If she develops symptoms on the right in the future we could reevaluate her reflux study to see if the reflux in the saphenous vein has progressed.  On the left side she has had a venous stent placed with a good result and has had previous laser ablation of the left great saphenous vein and anterior sensory saphenous vein.  She has some varicose veins in her thigh but I do not think this is significantly contributing to venous hypertension on the left.  Regardless she does not have any interest in stab phlebectomies for this.  We have discussed the importance of intermittent leg elevation and the proper positioning for this.  I encouraged her to continue to wear her compression stockings.  We discussed the importance of exercise specifically walking and water aerobics.  I encouraged her to avoid prolonged sitting and standing.  We also discussed the importance of keeping her skin well lubricated.  In addition we discussed the importance of maintaining a healthy weight as central obesity especially increases lower extremity venous pressure.   HPI:   [name] is a pleasant 74 y.o. female with a complicated history.  She had treatment of the may Thurner syndrome by Dr. Donzetta Matters last year and had a good result from that.  Previous to that she did undergone laser ablation of her left great saphenous vein in [place].  She tells me that this was complicated by DVT.  She subsequently had laser ablation of her anterior accessory saphenous vein by Dr. Kellie Simmering here.  She has had issues with venous ulcers of her left leg and has been followed in the wound care center.  She was last seen in early October and at that point her wounds have  essentially healed.  She had some varicose veins in her thigh and was set up for vascular consultation.  The patient does describe some swelling in both legs but especially on the left.  She has been using a lymphedema pump.  She does wear thigh-high compression stockings which help her swelling some.  She elevates her legs some but it sounds like she is not doing this correctly.  Past Medical History:  Diagnosis Date  . Allergy   . Anesthesia complication    trouble waking up 2/2 CPAP  . Arthritis   . Asthma in adult   . Cataract 2019   left eye resolved with surgery  . Cervical spondylosis 2012   Jacelyn Grip, [place])  . Choroidal nevus of left eye 03/22/2016  . Chronic venous insufficiency 2016   severe reflux with painful varicosities sees [facility]  . Clotting disorder ([place])    due to vein ablation   . Complication of anesthesia    difficulty coming out due to sleep apnea per pt  . Difficult intubation    PATIENT DENIES   . Diverticulosis    per ct scans 09-2016 and 01-2017  . DVT (deep venous thrombosis) ([place]) 2011   small, developed after venous ablation  . DVT (deep venous thrombosis) ([place])   . Family history of adverse reaction to anesthesia    O2 levels drop upon waking   . Hearing loss sensory, bilateral 2013   mod-severe high freq sensorineural (  [facility])  . Hiatal hernia     09-2016 ct scan [facility] at cancer center   . History of kidney stones 1980s  . History of radiation therapy 07/19/11-08/25/2011   RIGHT AXILLARY REGION/METASTATIC  . Hypertension   . Melanoma ([place]) 06/29/10   MALIGNANT MELANOMA R SHOULDER/SUPRASCAPULAR BACK s/p interferon chemo and XRT  . Neuromuscular disorder ([facility])    muscle spasms  . OSA (obstructive sleep apnea)    on CPAP  . Pneumonia    2001  . RLS (restless legs syndrome)   . Seasonal allergies   . Sleep apnea    wears cpap  . Tinnitus   . Unspecified vitamin D deficiency 03/18/2013  . Varicose veins of left lower extremity     Family  History  Problem Relation Age of Onset  . Cancer Mother 67       uterine  . CAD Father 35       MI  . Hypertension Father 82  . Alzheimer's disease Father 40  . Cancer Cousin        x2, breast  . Diabetes Sister   . Cancer Paternal Grandmother        melanoma, possibly  . Colon cancer Neg Hx   . Colon polyps Neg Hx   . Rectal cancer Neg Hx   . Stomach cancer Neg Hx     SOCIAL HISTORY: Social History   Tobacco Use  . Smoking status: Former Smoker    Types: Cigarettes    Quit date: 11/05/1965    Years since quitting: 54.9  . Smokeless tobacco: Never Used  . Tobacco comment: socially as a teen  Substance Use Topics  . Alcohol use: No    Alcohol/week: 0.0 standard drinks    Allergies  Allergen Reactions  . Sulfa Antibiotics Itching, Swelling and Shortness Of Breath    Facial swelling  . Voltaren [Diclofenac Sodium] Shortness Of Breath  . Latex Other (See Comments)    Blisters ONLY HAD REACTION TO TAPE  (??? ONLY ADHESIVE ALLERGY)  . Tape Other (See Comments)    Caused blisters - must use paper tape - same reaction from NeoPrene  . Doxycycline Other (See Comments)    Diaphoresis and dizziness  . Other     Neoprene    Current Outpatient Medications  Medication Sig Dispense Refill  . acetaminophen (TYLENOL) 650 MG CR tablet Take 1,300 mg by mouth every 8 (eight) hours as needed for pain.     Marland Kitchen albuterol (VENTOLIN HFA) 108 (90 Base) MCG/ACT inhaler INHALE 2 PUFFS BY MOUTH EVERY 6 HORUS AS NEEDED FOR WHEEZING/SHORTNESS OF BREATH 8.5 g 3  . aspirin EC 81 MG tablet Take 81 mg by mouth at bedtime. Melanoma prevention    . furosemide (LASIX) 40 MG tablet Take 1 tablet (40 mg total) by mouth 2 (two) times daily as needed for fluid. Take second dose if needed for leg swelling 60 tablet 6  . loratadine (CLARITIN) 10 MG tablet Take 10 mg by mouth daily.    Marland Kitchen losartan (COZAAR) 25 MG tablet TAKE 2 TABLETS BY MOUTH EVERY DAY 60 tablet 5  . meloxicam (MOBIC) 15 MG tablet TAKE 1  TABLET BY MOUTH EVERY DAY AS NEEDED FOR PAIN 30 tablet 1  . montelukast (SINGULAIR) 10 MG tablet TAKE 1 TABLET BY MOUTH EVERY DAY 30 tablet 3  . Multiple Vitamin (MULTIVITAMIN WITH MINERALS) TABS tablet Take 1 tablet by mouth daily.    . Polyvinyl Alcohol-Povidone (TEARS PLUS OP) Place  1 drop into both eyes daily as needed (dry eyes/ redness/ burning).     . potassium chloride SA (KLOR-CON) 20 MEQ tablet Take 1 tablet (20 mEq total) by mouth 2 (two) times daily. 180 tablet 3  . predniSONE (STERAPRED UNI-PAK 48 TAB) 5 MG (48) TBPK tablet Take by mouth.    Marland Kitchen PRESCRIPTION MEDICATION CPAP    . Probiotic Product (PROBIOTIC DAILY PO) Take 1 tablet by mouth daily.     Marland Kitchen triamcinolone (NASACORT) 55 MCG/ACT AERO nasal inhaler Place 2 sprays into the nose daily. In each nostril    . triamcinolone cream (KENALOG) 0.1 % Apply 1 application topically 2 (two) times daily. Apply to AA. Max 2 weeks at a time. 80 g 1  . UNABLE TO FIND Take by mouth.    . Vitamin D, Ergocalciferol, (DRISDOL) 1.25 MG (50000 UNIT) CAPS capsule TAKE 1 CAPSULE (50,000 UNITS TOTAL) BY MOUTH EVERY 7 (SEVEN) DAYS. 12 capsule 3  . cephALEXin (KEFLEX) 500 MG capsule Take 1 capsule (500 mg total) by mouth 3 (three) times daily. (Patient not taking: Reported on 10/20/2020) 21 capsule 0  . famotidine (PEPCID) 20 MG tablet Take 1 tablet (20 mg total) by mouth at bedtime. (Patient not taking: Reported on 10/20/2020)     No current facility-administered medications for this visit.    REVIEW OF SYSTEMS:  [X]  denotes positive finding, [ ]  denotes negative finding Cardiac  Comments:  Chest pain or chest pressure:    Shortness of breath upon exertion:    Short of breath when lying flat:    Irregular heart rhythm:        Vascular    Pain in calf, thigh, or hip brought on by ambulation:    Pain in feet at night that wakes you up from your sleep:     Blood clot in your veins:    Leg swelling:  x       Pulmonary    Oxygen at home:     Productive cough:     Wheezing:         Neurologic    Sudden weakness in arms or legs:     Sudden numbness in arms or legs:     Sudden onset of difficulty speaking or slurred speech:    Temporary loss of vision in one eye:     Problems with dizziness:         Gastrointestinal    Blood in stool:     Vomited blood:         Genitourinary    Burning when urinating:     Blood in urine:        Psychiatric    Major depression:         Hematologic    Bleeding problems:    Problems with blood clotting too easily:        Skin    Rashes or ulcers: x       Constitutional    Fever or chills:     PHYSICAL EXAM:   Vitals:   10/20/20 1610  BP: (!) 143/80  Pulse: 79  Resp: 18  Temp: (!) 97.5 F (36.4 C)  TempSrc: Temporal  SpO2: 98%  Weight: 214 lb (97.1 kg)  Height: 5\' 2"  (1.575 m)    GENERAL: The patient is a well-nourished female, in no acute distress. The vital signs are documented above. CARDIAC: There is a regular rate and rhythm.  VASCULAR: I do not detect carotid bruits. She has  palpable posterior tibial pulses bilaterally. She has some dilated varicose veins along the lateral aspect of her left leg. She has several small wounds on her left leg as documented below .       PULMONARY: There is good air exchange bilaterally without wheezing or rales. ABDOMEN: Soft and non-tender with normal pitched bowel sounds.  MUSCULOSKELETAL: There are no major deformities or cyanosis. NEUROLOGIC: No focal weakness or paresthesias are detected. SKIN: There are no ulcers or rashes noted. PSYCHIATRIC: The patient has a normal affect.  DATA:    VENOUS DUPLEX: I reviewed her venous duplex scan from 08/19/2020.  On the right side there was no evidence of DVT.  There was deep venous reflux involving the common femoral vein.  There was some reflux in the right great saphenous vein but no reflux at the saphenofemoral junction of the vein was not especially dilated.  On the  left side there is no evidence of DVT.  There is deep venous reflux involving the common femoral vein.  She is previously had ablation of the left great saphenous vein.  Deitra Mayo [facility] [phone]

## 2020-11-02 ENCOUNTER — Other Ambulatory Visit: Payer: Self-pay | Admitting: Family Medicine

## 2020-11-02 NOTE — Telephone Encounter (Signed)
Pharmacy requests refill on: [place]-Con 20 MeQ  LAST REFILL: 10/29/2019 (Q-180, R-3) LAST OV: 10/17/2020 NEXT OV: Not Scheduled PHARMACY: [facility] [id number], [place]

## 2020-11-07 ENCOUNTER — Ambulatory Visit: Payer: Medicare Other | Admitting: Podiatry

## 2020-11-08 DIAGNOSIS — L0109 Other impetigo: Secondary | ICD-10-CM | POA: Diagnosis not present

## 2020-11-08 DIAGNOSIS — L0889 Other specified local infections of the skin and subcutaneous tissue: Secondary | ICD-10-CM | POA: Diagnosis not present

## 2020-11-08 DIAGNOSIS — I83222 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 of calf and inflammation: Secondary | ICD-10-CM | POA: Diagnosis not present

## 2020-11-08 DIAGNOSIS — Z85828 Personal history of other malignant neoplasm of skin: Secondary | ICD-10-CM | POA: Diagnosis not present

## 2020-11-08 DIAGNOSIS — Z85820 Personal history of malignant melanoma of skin: Secondary | ICD-10-CM | POA: Diagnosis not present

## 2020-11-09 ENCOUNTER — Encounter: Payer: Self-pay | Admitting: Family Medicine

## 2020-11-09 ENCOUNTER — Ambulatory Visit (INDEPENDENT_AMBULATORY_CARE_PROVIDER_SITE_OTHER): Payer: Medicare Other | Admitting: Family Medicine

## 2020-11-09 ENCOUNTER — Other Ambulatory Visit: Payer: Self-pay

## 2020-11-09 VITALS — BP 124/80 | HR 81 | Temp 97.0°F | Ht 63.0 in | Wt 224.5 lb

## 2020-11-09 DIAGNOSIS — I83029 Varicose veins of left lower extremity with ulcer of unspecified site: Secondary | ICD-10-CM | POA: Diagnosis not present

## 2020-11-09 DIAGNOSIS — R600 Localized edema: Secondary | ICD-10-CM | POA: Diagnosis not present

## 2020-11-09 DIAGNOSIS — I871 Compression of vein: Secondary | ICD-10-CM | POA: Diagnosis not present

## 2020-11-09 DIAGNOSIS — I83893 Varicose veins of bilateral lower extremities with other complications: Secondary | ICD-10-CM | POA: Diagnosis not present

## 2020-11-09 DIAGNOSIS — I83892 Varicose veins of left lower extremities with other complications: Secondary | ICD-10-CM

## 2020-11-09 DIAGNOSIS — R609 Edema, unspecified: Secondary | ICD-10-CM | POA: Diagnosis not present

## 2020-11-09 DIAGNOSIS — I872 Venous insufficiency (chronic) (peripheral): Secondary | ICD-10-CM | POA: Diagnosis not present

## 2020-11-09 DIAGNOSIS — L97929 Non-pressure chronic ulcer of unspecified part of left lower leg with unspecified severity: Secondary | ICD-10-CM | POA: Diagnosis not present

## 2020-11-09 NOTE — Assessment & Plan Note (Addendum)
Several recurrent venous stasis ulcers to LLE in setting of known CVI. Possible purulent cellulitis pending recent wound culture collected by derm. She has started on oral keflex + topical mupirocin course and has close f/u planned with derm. Discussed wound clinic referral if not improving, discussed option of home health wound care however pt may not be eligible if she is not homebound.  Reviewed home wound care.

## 2020-11-09 NOTE — Patient Instructions (Signed)
Continue antibiotics (oral and topical), continue daily dressing changes after cleaning skin, keep follow up with derm.  Let me know if not improving to return to wound clinic.

## 2020-11-09 NOTE — Progress Notes (Signed)
Patient ID: [name], female    DOB: [birth year], 75 y.o.   MRN: [id number]  This visit was conducted in person.  BP 124/80 (BP Location: Left Arm, Patient Position: Sitting, Cuff Size: Large)   Pulse 81   Temp (!) 97 F (36.1 C) (Temporal)   Ht 5\' 3"  (1.6 m)   Wt 224 lb 8 oz (101.8 kg)   SpO2 93%   BMI 39.77 kg/m    CC: check leg Subjective:   HPI: [name] is a 75 y.o. female presenting on 11/09/2020 for Sore on leg (Pt stated that she has a surface sore on her Lt leg that has been their for the past month)   Left leg sore developed over the past week, as well as progressive soreness behind the leg. No fevers/chills, no nausea. Saw dermatologist Dr Ronnald Ramp yesterday - ?MRSA skin infection s/p culture. Started on keflex and topical mupirocin. Also wrapping legs daily.   Saw [facility] last month, stable period. CEAP C5 venous disease. Venous stent placed to L leg, s/p laser ablation. rec leg elevation, 15-18mmHg compression stockings, regular walking and water aerobics.   Last keflex course was 09/2020, at that time for recurrent venous stasis ulcer.      Relevant past medical, surgical, family and social history reviewed and updated as indicated. Interim medical history since our last visit reviewed. Allergies and medications reviewed and updated. Outpatient Medications Prior to Visit  Medication Sig Dispense Refill  . acetaminophen (TYLENOL) 650 MG CR tablet Take 1,300 mg by mouth every 8 (eight) hours as needed for pain.     Marland Kitchen albuterol (VENTOLIN HFA) 108 (90 Base) MCG/ACT inhaler INHALE 2 PUFFS BY MOUTH EVERY 6 HORUS AS NEEDED FOR WHEEZING/SHORTNESS OF BREATH 8.5 g 3  . aspirin EC 81 MG tablet Take 81 mg by mouth at bedtime. Melanoma prevention    . cephALEXin (KEFLEX) 500 MG capsule Take 1 capsule (500 mg total) by mouth 3 (three) times daily. 21 capsule 0  . famotidine (PEPCID) 20 MG tablet Take 1 tablet (20 mg total) by mouth at bedtime.    . furosemide (LASIX) 40 MG  tablet Take 1 tablet (40 mg total) by mouth 2 (two) times daily as needed for fluid. Take second dose if needed for leg swelling 60 tablet 6  . KLOR-CON M20 20 MEQ tablet TAKE 1 TABLET BY MOUTH TWICE A DAY 60 tablet 5  . loratadine (CLARITIN) 10 MG tablet Take 10 mg by mouth daily.    Marland Kitchen losartan (COZAAR) 25 MG tablet TAKE 2 TABLETS BY MOUTH EVERY DAY 60 tablet 5  . meloxicam (MOBIC) 15 MG tablet TAKE 1 TABLET BY MOUTH EVERY DAY AS NEEDED FOR PAIN 30 tablet 1  . montelukast (SINGULAIR) 10 MG tablet TAKE 1 TABLET BY MOUTH EVERY DAY 30 tablet 3  . Multiple Vitamin (MULTIVITAMIN WITH MINERALS) TABS tablet Take 1 tablet by mouth daily.    . Polyvinyl Alcohol-Povidone (TEARS PLUS OP) Place 1 drop into both eyes daily as needed (dry eyes/ redness/ burning).     Marland Kitchen PRESCRIPTION MEDICATION CPAP    . Probiotic Product (PROBIOTIC DAILY PO) Take 1 tablet by mouth daily.     Marland Kitchen triamcinolone (NASACORT) 55 MCG/ACT AERO nasal inhaler Place 2 sprays into the nose daily. In each nostril    . triamcinolone cream (KENALOG) 0.1 % Apply 1 application topically 2 (two) times daily. Apply to AA. Max 2 weeks at a time. 80 g 1  .  UNABLE TO FIND Take by mouth.    . Vitamin D, Ergocalciferol, (DRISDOL) 1.25 MG (50000 UNIT) CAPS capsule TAKE 1 CAPSULE (50,000 UNITS TOTAL) BY MOUTH EVERY 7 (SEVEN) DAYS. 12 capsule 3  . predniSONE (STERAPRED UNI-PAK 48 TAB) 5 MG (48) TBPK tablet Take by mouth.     No facility-administered medications prior to visit.     Per HPI unless specifically indicated in ROS section below Review of Systems Objective:  BP 124/80 (BP Location: Left Arm, Patient Position: Sitting, Cuff Size: Large)   Pulse 81   Temp (!) 97 F (36.1 C) (Temporal)   Ht 5\' 3"  (1.6 m)   Wt 224 lb 8 oz (101.8 kg)   SpO2 93%   BMI 39.77 kg/m   Wt Readings from Last 3 Encounters:  11/09/20 224 lb 8 oz (101.8 kg)  10/20/20 214 lb (97.1 kg)  10/17/20 214 lb 1 oz (97.1 kg)      Physical Exam Vitals and nursing note  reviewed.  Constitutional:      Appearance: Normal appearance. She is obese. She is not ill-appearing.  Musculoskeletal:        General: Swelling and tenderness present.     Right lower leg: Edema present.     Left lower leg: Edema present.  Skin:    General: Skin is warm and dry.     Findings: Erythema, lesion and wound present.          Comments:  Mild erythema of LLE  L anterior medial lower leg with erosions  Areas of macerated denuded skin to L posterior leg just proximal to ankle with significant scaling surrounding lesions  See pictures  Neurological:     Mental Status: She is alert.  Psychiatric:        Mood and Affect: Mood normal.        Behavior: Behavior normal.       Wounds cleaned with normal saline and gauze, wounds dressed with triple abx ointment, nonstick gauze, kerlex and coban wrap.     Assessment & Plan:  This visit occurred during the SARS-CoV-2 public health emergency.  Safety protocols were in place, including screening questions prior to the visit, additional usage of staff PPE, and extensive cleaning of exam room while observing appropriate contact time as indicated for disinfecting solutions.   Problem List Items Addressed This Visit    Venous stasis ulcer of left lower leg with edema of left lower leg (HCC) - Primary    Several recurrent venous stasis ulcers to LLE in setting of known CVI. Possible purulent cellulitis pending recent wound culture collected by derm. She has started on oral keflex + topical mupirocin course and has close f/u planned with derm. Discussed wound clinic referral if not improving, discussed option of home health wound care however pt may not be eligible if she is not homebound.  Reviewed home wound care.       Varicose veins of both lower extremities with complications   May-Thurner syndrome   Chronic venous insufficiency   Bilateral lower extremity edema       No orders of the defined types were placed in this  encounter.  No orders of the defined types were placed in this encounter.   Patient Instructions  Continue antibiotics (oral and topical), continue daily dressing changes after cleaning skin, keep follow up with derm.  Let me know if not improving to return to wound clinic.    Follow up plan: Return if symptoms worsen or  fail to improve.  [name], MD

## 2020-11-14 DIAGNOSIS — I83202 Varicose veins of unspecified lower extremity with both ulcer of calf and inflammation: Secondary | ICD-10-CM | POA: Diagnosis not present

## 2020-11-14 DIAGNOSIS — Z85828 Personal history of other malignant neoplasm of skin: Secondary | ICD-10-CM | POA: Diagnosis not present

## 2020-11-14 DIAGNOSIS — L0109 Other impetigo: Secondary | ICD-10-CM | POA: Diagnosis not present

## 2020-11-14 DIAGNOSIS — Z85820 Personal history of malignant melanoma of skin: Secondary | ICD-10-CM | POA: Diagnosis not present

## 2020-11-27 ENCOUNTER — Other Ambulatory Visit: Payer: Self-pay | Admitting: Family Medicine

## 2020-11-29 ENCOUNTER — Other Ambulatory Visit: Payer: Self-pay | Admitting: Family Medicine

## 2020-11-30 DIAGNOSIS — I872 Venous insufficiency (chronic) (peripheral): Secondary | ICD-10-CM | POA: Diagnosis not present

## 2020-11-30 DIAGNOSIS — Z85828 Personal history of other malignant neoplasm of skin: Secondary | ICD-10-CM | POA: Diagnosis not present

## 2020-11-30 DIAGNOSIS — I8312 Varicose veins of left lower extremity with inflammation: Secondary | ICD-10-CM | POA: Diagnosis not present

## 2020-11-30 DIAGNOSIS — I8311 Varicose veins of right lower extremity with inflammation: Secondary | ICD-10-CM | POA: Diagnosis not present

## 2020-11-30 DIAGNOSIS — Z85820 Personal history of malignant melanoma of skin: Secondary | ICD-10-CM | POA: Diagnosis not present

## 2020-12-01 DIAGNOSIS — Z23 Encounter for immunization: Secondary | ICD-10-CM | POA: Diagnosis not present

## 2020-12-06 ENCOUNTER — Ambulatory Visit: Payer: Medicare Other | Admitting: Family Medicine

## 2020-12-12 ENCOUNTER — Ambulatory Visit (INDEPENDENT_AMBULATORY_CARE_PROVIDER_SITE_OTHER): Payer: Medicare Other | Admitting: Family Medicine

## 2020-12-12 ENCOUNTER — Encounter: Payer: Self-pay | Admitting: Family Medicine

## 2020-12-12 ENCOUNTER — Other Ambulatory Visit: Payer: Self-pay

## 2020-12-12 DIAGNOSIS — R609 Edema, unspecified: Secondary | ICD-10-CM

## 2020-12-12 DIAGNOSIS — I83029 Varicose veins of left lower extremity with ulcer of unspecified site: Secondary | ICD-10-CM

## 2020-12-12 DIAGNOSIS — M5431 Sciatica, right side: Secondary | ICD-10-CM

## 2020-12-12 DIAGNOSIS — G8929 Other chronic pain: Secondary | ICD-10-CM | POA: Insufficient documentation

## 2020-12-12 DIAGNOSIS — I83892 Varicose veins of left lower extremities with other complications: Secondary | ICD-10-CM | POA: Diagnosis not present

## 2020-12-12 DIAGNOSIS — L97929 Non-pressure chronic ulcer of unspecified part of left lower leg with unspecified severity: Secondary | ICD-10-CM

## 2020-12-12 MED ORDER — TRAMADOL HCL 50 MG PO TABS: 50.0000 mg | ORAL_TABLET | Freq: Three times a day (TID) | ORAL | 0 refills | Status: DC | PRN

## 2020-12-12 NOTE — Progress Notes (Signed)
Patient ID: [name], female    DOB: 1946-01-02, 75 y.o.   MRN: [id number]  This visit was conducted in person.  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 female presenting on 12/12/2020 for Leg Pain (C/o right leg pain.  Starts in right buttock radiating to back of calf.  Started about 3 wks ago.  H/o ruptured disc in lower back. )   3 wks ago while getting into her car noticed groin pain which did improve. Over the past 2 weeks noticing increasing sciatic pain - shooting pain down R buttock. Started on methylprednisolone taper (24mg -->6mg ) through neurosurgery. She is finishing course. Normally takes meloxicam but not currently due to concomitant methylprednisolone taper. [name] also been using heating pad.   Denies urinary or bowel accidents, inciting trauma/injury, numbness or weakness of leg.   Known h/o lumbar HNP.   Has recently been dealing with left leg sore that developed in the past month as well as left posterior leg soreness. Derm has been treating venous stasis ulcers and possible cellulitis with keflex and topical mupirocin and leg wraps. Now using artificial skin hydrocolloid dressing changes regularly. Doing this in place of return to wound center. Requests medial L lower leg dressing changed.      Relevant past medical, surgical, family and social history reviewed and updated as indicated. Interim medical history since our last visit reviewed. Allergies and medications reviewed and updated. Outpatient Medications Prior to Visit  Medication Sig Dispense Refill  . acetaminophen (TYLENOL) 650 MG CR tablet Take 1,300 mg by mouth every 8 (eight) hours as needed for pain.     Marland Kitchen albuterol (VENTOLIN HFA) 108 (90 Base) MCG/ACT  inhaler INHALE 2 PUFFS BY MOUTH EVERY 6 HORUS AS NEEDED FOR WHEEZING/SHORTNESS OF BREATH 8.5 g 3  . aspirin EC 81 MG tablet Take 81 mg by mouth at bedtime. Melanoma prevention    . famotidine (PEPCID) 20 MG tablet Take 1 tablet (20 mg total) by mouth at bedtime.    . furosemide (LASIX) 40 MG tablet Take 1 tablet (40 mg total) by mouth 2 (two) times daily as needed for fluid. Take second dose if needed for leg swelling 60 tablet 6  . KLOR-CON M20 20 MEQ tablet TAKE 1 TABLET BY MOUTH TWICE A DAY 60 tablet 5  . loratadine (CLARITIN) 10 MG tablet Take 10 mg by mouth daily.    Marland Kitchen losartan (COZAAR) 25 MG tablet TAKE 2 TABLETS BY MOUTH EVERY DAY 60 tablet 5  . meloxicam (MOBIC) 15 MG tablet TAKE 1 TABLET BY MOUTH EVERY DAY AS NEEDED FOR PAIN 30 tablet 1  . methylPREDNISolone (MEDROL DOSEPAK) 4 MG TBPK tablet TAKE 6 TABLETS ON DAY 1 AS DIRECTED ON PACKAGE AND DECREASE BY 1 TAB EACH DAY FOR A TOTAL OF 6 DAYS    . montelukast (SINGULAIR) 10 MG tablet TAKE 1 TABLET BY MOUTH EVERY DAY 30 tablet 3  . Multiple Vitamin (MULTIVITAMIN WITH MINERALS) TABS tablet Take 1 tablet by mouth daily.    . Polyvinyl Alcohol-Povidone (TEARS PLUS OP) Place 1 drop into both eyes daily as needed (dry eyes/ redness/  burning).     Marland Kitchen PRESCRIPTION MEDICATION CPAP    . Probiotic Product (PROBIOTIC DAILY PO) Take 1 tablet by mouth daily.     Marland Kitchen triamcinolone (NASACORT) 55 MCG/ACT AERO nasal inhaler Place 2 sprays into the nose daily. In each nostril    . triamcinolone cream (KENALOG) 0.1 % Apply 1 application topically 2 (two) times daily. Apply to AA. Max 2 weeks at a time. 80 g 1  . UNABLE TO FIND Take by mouth.    . Vitamin D, Ergocalciferol, (DRISDOL) 1.25 MG (50000 UNIT) CAPS capsule TAKE 1 CAPSULE (50,000 UNITS TOTAL) BY MOUTH EVERY 7 (SEVEN) DAYS. 12 capsule 3  . cephALEXin (KEFLEX) 500 MG capsule Take 1 capsule (500 mg total) by mouth 3 (three) times daily. 21 capsule 0   No facility-administered medications prior to visit.      Per HPI unless specifically indicated in ROS section below Review of Systems Objective:  BP 134/72 (BP Location: Left Arm, Patient Position: Sitting, Cuff Size: Large)   Pulse 85   Temp 97.9 F (36.6 C) (Temporal)   Ht 5\' 3"  (1.6 m)   Wt 217 lb 9 oz (98.7 kg)   SpO2 96%   BMI 38.54 kg/m   Wt Readings from Last 3 Encounters:  12/12/20 217 lb 9 oz (98.7 kg)  11/09/20 224 lb 8 oz (101.8 kg)  10/20/20 214 lb (97.1 kg)      Physical Exam Vitals and nursing note reviewed.  Constitutional:      Appearance: Normal appearance. She is not ill-appearing.  Musculoskeletal:        General: Swelling and tenderness present. Normal range of motion.     Right lower leg: Edema present.     Left lower leg: Edema present.     Comments:  Discomfort to palpation of midline lower lumbar spine  No pain with int/ext rotation at hip  Neg seated SLR bilaterally Chronic bilateral lower extremity swelling - largely nonpitting  Skin:    General: Skin is warm and dry.     Findings: Wound present. No rash.     Comments: 1cm ulcer to L medial lower leg, smaller healing 57mm ulcer proximally medial left leg. No surrounding streaking erythema or warmth  Neurological:     Mental Status: She is alert.       Wound care: Old hydrocolloid removed, skin cleaned with alcohol pad and new hydrocolloid applied. Pt tolerated well. Larger wound with healthy granulation tissue present without surrounding erythema.   Results for orders placed or performed in visit on 10/12/20  CBC with Differential/Platelet  Result Value Ref Range   WBC 8.0 4.0 - 10.5 K/uL   RBC 4.71 3.87 - 5.11 Mil/uL   Hemoglobin 13.7 12.0 - 15.0 g/dL   HCT 41.9 36.0 - 46.0 %   MCV 89.0 78.0 - 100.0 fl   MCHC 32.6 30.0 - 36.0 g/dL   RDW 14.4 11.5 - 15.5 %   Platelets 229.0 150.0 - 400.0 K/uL   Neutrophils Relative % 75.1 43.0 - 77.0 %   Lymphocytes Relative 19.2 12.0 - 46.0 %   Monocytes Relative 5.0 3.0 - 12.0 %   Eosinophils Relative  0.5 0.0 - 5.0 %   Basophils Relative 0.2 0.0 - 3.0 %   Neutro Abs 6.0 1.4 - 7.7 K/uL   Lymphs Abs 1.5 0.7 - 4.0 K/uL   Monocytes Absolute 0.4 0.1 - 1.0 K/uL   Eosinophils Absolute 0.0 0.0 - 0.7 K/uL   Basophils Absolute 0.0  0.0 - 0.1 K/uL  Ferritin  Result Value Ref Range   Ferritin 51.2 10.0 - 291.0 ng/mL  IBC panel  Result Value Ref Range   Iron 59 42 - 145 ug/dL   Transferrin 253.0 212.0 - 360.0 mg/dL   Saturation Ratios 16.7 (L) 20.0 - 50.0 %  VITAMIN D 25 Hydroxy (Vit-D Deficiency, Fractures)  Result Value Ref Range   VITD 44.84 30.00 - 100.00 ng/mL   *Note: Due to a large number of results and/or encounters for the requested time period, some results have not been displayed. A complete set of results can be found in Results Review.   Assessment & Plan:  This visit occurred during the SARS-CoV-2 public health emergency.  Safety protocols were in place, including screening questions prior to the visit, additional usage of staff PPE, and extensive cleaning of exam room while observing appropriate contact time as indicated for disinfecting solutions.   Problem List Items Addressed This Visit    Venous stasis ulcer of left lower leg with edema of left lower leg (HCC)    2 residual ulcers which are slowly healing.  No signs of bacterial infection /cellulitis at this time. Hydrocolloid dressing changed in office today.       Right sided sciatica    2 wk h/o what sounds like R sciatica - has received medrol dose pack through neurosurgery. Will add tramadol for breakthrough pain. Provided with stretching exercises from [facility] pt advisor for piriformis syndrome. She will restart her meloxicam when she completes steroid taper. Update if not improving as expected.           Meds ordered this encounter  Medications  . traMADol (ULTRAM) 50 MG tablet    Sig: Take 1 tablet (50 mg total) by mouth every 8 (eight) hours as needed for up to 5 days for moderate pain.    Dispense:  15 tablet     Refill:  0   No orders of the defined types were placed in this encounter.   Patient Instructions  Left leg dressing changed. I do think you have right leg sciatica  Finish methylprednisolone course. Then may restart meloxicam.  May use tramadol for breakthrough pain - sent to pharmacy.   Follow up plan: Return if symptoms worsen or fail to improve.  [name], MD

## 2020-12-12 NOTE — Assessment & Plan Note (Signed)
2 wk h/o what sounds like R sciatica - has received medrol dose pack through neurosurgery. Will add tramadol for breakthrough pain. Provided with stretching exercises from [facility] pt advisor for piriformis syndrome. She will restart her meloxicam when she completes steroid taper. Update if not improving as expected.

## 2020-12-12 NOTE — Patient Instructions (Signed)
Left leg dressing changed. I do think you have right leg sciatica  Finish methylprednisolone course. Then may restart meloxicam.  May use tramadol for breakthrough pain - sent to pharmacy.

## 2020-12-12 NOTE — Assessment & Plan Note (Signed)
2 residual ulcers which are slowly healing.  No signs of bacterial infection /cellulitis at this time. Hydrocolloid dressing changed in office today.

## 2021-01-02 ENCOUNTER — Other Ambulatory Visit: Payer: Self-pay | Admitting: Family Medicine

## 2021-01-23 ENCOUNTER — Other Ambulatory Visit: Payer: Self-pay

## 2021-01-24 ENCOUNTER — Ambulatory Visit (INDEPENDENT_AMBULATORY_CARE_PROVIDER_SITE_OTHER): Payer: Medicare Other | Admitting: Family Medicine

## 2021-01-24 ENCOUNTER — Encounter: Payer: Self-pay | Admitting: Family Medicine

## 2021-01-24 VITALS — BP 150/84 | HR 76 | Temp 97.4°F | Ht 63.0 in | Wt 218.8 lb

## 2021-01-24 DIAGNOSIS — R400 Somnolence: Secondary | ICD-10-CM | POA: Diagnosis not present

## 2021-01-24 DIAGNOSIS — I83892 Varicose veins of left lower extremities with other complications: Secondary | ICD-10-CM | POA: Diagnosis not present

## 2021-01-24 DIAGNOSIS — G4733 Obstructive sleep apnea (adult) (pediatric): Secondary | ICD-10-CM

## 2021-01-24 DIAGNOSIS — Z9989 Dependence on other enabling machines and devices: Secondary | ICD-10-CM

## 2021-01-24 DIAGNOSIS — I10 Essential (primary) hypertension: Secondary | ICD-10-CM | POA: Diagnosis not present

## 2021-01-24 DIAGNOSIS — L97929 Non-pressure chronic ulcer of unspecified part of left lower leg with unspecified severity: Secondary | ICD-10-CM

## 2021-01-24 DIAGNOSIS — I83029 Varicose veins of left lower extremity with ulcer of unspecified site: Secondary | ICD-10-CM | POA: Diagnosis not present

## 2021-01-24 DIAGNOSIS — R609 Edema, unspecified: Secondary | ICD-10-CM | POA: Diagnosis not present

## 2021-01-24 LAB — BASIC METABOLIC PANEL
BUN: 15 mg/dL (ref 6–23)
CO2: 31 mEq/L (ref 19–32)
Calcium: 9.4 mg/dL (ref 8.4–10.5)
Chloride: 107 mEq/L (ref 96–112)
Creatinine, Ser: 0.67 mg/dL (ref 0.40–1.20)
GFR: 86.03 mL/min (ref >60.00)
Glucose, Bld: 96 mg/dL (ref 70–99)
Potassium: 4.1 mEq/L (ref 3.5–5.1)
Sodium: 143 mEq/L (ref 135–145)

## 2021-01-24 LAB — TSH: TSH: 2.69 u[IU]/mL (ref 0.35–4.50)

## 2021-01-24 NOTE — Patient Instructions (Addendum)
Labs today.  Work on bedtime routine, limit daytime naps.  See below for sleeping checklist.   Sleep hygiene checklist: 1. Avoid naps during the day 2. Avoid stimulants such as caffeine and nicotine. Avoid bedtime alcohol (it can speed onset of sleep but the body's metabolism can cause awakenings). 3. All forms of exercise help ensure sound sleep - limit vigorous exercise to morning or late afternoon 4. Avoid food too close to bedtime including chocolate (which contains caffeine) 5. Soak up natural light 6. Establish regular bedtime routine. 7. Associate bed with sleep - avoid TV, computer or phone, reading while in bed. 8. Ensure pleasant, relaxing sleep environment - quiet, dark, cool room.

## 2021-01-24 NOTE — Assessment & Plan Note (Signed)
Stable period followed by pulmonology. She will call to schedule f/u as received letter she's due.

## 2021-01-24 NOTE — Assessment & Plan Note (Signed)
Continues hydrocolloid dressing changes.

## 2021-01-24 NOTE — Progress Notes (Signed)
Patient ID: [name], female    DOB: [birth year], 75 y.o.   MRN: [id number]  This visit was conducted in person.  BP (!) 150/84 (BP Location: Left Arm, Patient Position: Sitting, Cuff Size: Large)   Pulse 76   Temp (!) 97.4 F (36.3 C) (Temporal)   Ht 5\' 3"  (1.6 m)   Wt 218 lb 12.8 oz (99.2 kg)   SpO2 98%   BMI 38.76 kg/m    CC: daytime somnolence  Subjective:   HPI: [name] is a 75 y.o. female presenting on 01/24/2021 for sleep issues (Falling asleep all the time but cannot sleep at nigh )   Lives in 40 ft RV. Lives with 16 cats at home.   Doesn't check BP at home - elevation noted today. She continues daily lasix 40mg  with losartan 50mg  daily.   Notes increasing daytime somnolence along with trouble sleeping at night. Intermittent trouble with this over the years. Taking daytime naps. Bedtime 11-12 at night. Then not sleepy at night - can get in bed late at 4am. Doesn't take anything for sleep.   Known OSA on CPAP at setting of 12 cm ([facility]P) - latest eval mid 2021 with stable report, no need to change CPAP settings at that time.      Relevant past medical, surgical, family and social history reviewed and updated as indicated. Interim medical history since our last visit reviewed. Allergies and medications reviewed and updated. Outpatient Medications Prior to Visit  Medication Sig Dispense Refill  . acetaminophen (TYLENOL) 650 MG CR tablet Take 1,300 mg by mouth every 8 (eight) hours as needed for pain.     Marland Kitchen albuterol (VENTOLIN HFA) 108 (90 Base) MCG/ACT inhaler INHALE 2 PUFFS BY MOUTH EVERY 6 HORUS AS NEEDED FOR WHEEZING/SHORTNESS OF BREATH 8.5 g 3  . aspirin EC 81 MG tablet Take 81 mg by mouth at bedtime. Melanoma prevention    . famotidine (PEPCID) 20 MG tablet Take 1 tablet (20 mg total) by mouth at bedtime.    . furosemide (LASIX) 40 MG tablet Take 1 tablet (40 mg total) by mouth 2 (two) times daily as needed for fluid. Take second dose if needed for leg  swelling 60 tablet 6  . KLOR-CON M20 20 MEQ tablet TAKE 1 TABLET BY MOUTH TWICE A DAY 60 tablet 5  . loratadine (CLARITIN) 10 MG tablet Take 10 mg by mouth daily.    Marland Kitchen losartan (COZAAR) 25 MG tablet TAKE 2 TABLETS BY MOUTH EVERY DAY 60 tablet 5  . meloxicam (MOBIC) 15 MG tablet TAKE 1 TABLET BY MOUTH EVERY DAY AS NEEDED FOR PAIN 30 tablet 1  . montelukast (SINGULAIR) 10 MG tablet TAKE 1 TABLET BY MOUTH EVERY DAY 30 tablet 3  . Multiple Vitamin (MULTIVITAMIN WITH MINERALS) TABS tablet Take 1 tablet by mouth daily.    . Polyvinyl Alcohol-Povidone (TEARS PLUS OP) Place 1 drop into both eyes daily as needed (dry eyes/ redness/ burning).     Marland Kitchen PRESCRIPTION MEDICATION CPAP    . Probiotic Product (PROBIOTIC DAILY PO) Take 1 tablet by mouth daily.     Marland Kitchen triamcinolone (NASACORT) 55 MCG/ACT AERO nasal inhaler Place 2 sprays into the nose daily. In each nostril    . triamcinolone cream (KENALOG) 0.1 % Apply 1 application topically 2 (two) times daily. Apply to AA. Max 2 weeks at a time. 80 g 1  . UNABLE TO FIND Take by mouth.    . Vitamin D, Ergocalciferol, (DRISDOL)  1.25 MG (50000 UNIT) CAPS capsule TAKE 1 CAPSULE BY MOUTH EVERY 7 DAYS 12 capsule 3  . methylPREDNISolone (MEDROL DOSEPAK) 4 MG TBPK tablet TAKE 6 TABLETS ON DAY 1 AS DIRECTED ON PACKAGE AND DECREASE BY 1 TAB EACH DAY FOR A TOTAL OF 6 DAYS (Patient not taking: Reported on 01/24/2021)     No facility-administered medications prior to visit.     Per HPI unless specifically indicated in ROS section below Review of Systems Objective:  BP (!) 150/84 (BP Location: Left Arm, Patient Position: Sitting, Cuff Size: Large)   Pulse 76   Temp (!) 97.4 F (36.3 C) (Temporal)   Ht 5\' 3"  (1.6 m)   Wt 218 lb 12.8 oz (99.2 kg)   SpO2 98%   BMI 38.76 kg/m   Wt Readings from Last 3 Encounters:  01/24/21 218 lb 12.8 oz (99.2 kg)  12/12/20 217 lb 9 oz (98.7 kg)  11/09/20 224 lb 8 oz (101.8 kg)      Physical Exam Vitals and nursing note reviewed.   Constitutional:      Appearance: Normal appearance. She is not ill-appearing.  Cardiovascular:     Rate and Rhythm: Normal rate and regular rhythm.     Pulses: Normal pulses.     Heart sounds: Murmur (3/6 systolic USB) heard.    Pulmonary:     Effort: Pulmonary effort is normal. No respiratory distress.     Breath sounds: Normal breath sounds. No wheezing, rhonchi or rales.  Musculoskeletal:     Right lower leg: Edema (tr) present.     Left lower leg: Edema (tr) present.  Neurological:     Mental Status: She is alert.  Psychiatric:        Mood and Affect: Mood normal.        Behavior: Behavior normal.       Results for orders placed or performed in visit on 10/12/20  CBC with Differential/Platelet  Result Value Ref Range   WBC 8.0 4.0 - 10.5 K/uL   RBC 4.71 3.87 - 5.11 Mil/uL   Hemoglobin 13.7 12.0 - 15.0 g/dL   HCT 41.9 36.0 - 46.0 %   MCV 89.0 78.0 - 100.0 fl   MCHC 32.6 30.0 - 36.0 g/dL   RDW 14.4 11.5 - 15.5 %   Platelets 229.0 150.0 - 400.0 K/uL   Neutrophils Relative % 75.1 43.0 - 77.0 %   Lymphocytes Relative 19.2 12.0 - 46.0 %   Monocytes Relative 5.0 3.0 - 12.0 %   Eosinophils Relative 0.5 0.0 - 5.0 %   Basophils Relative 0.2 0.0 - 3.0 %   Neutro Abs 6.0 1.4 - 7.7 K/uL   Lymphs Abs 1.5 0.7 - 4.0 K/uL   Monocytes Absolute 0.4 0.1 - 1.0 K/uL   Eosinophils Absolute 0.0 0.0 - 0.7 K/uL   Basophils Absolute 0.0 0.0 - 0.1 K/uL  Ferritin  Result Value Ref Range   Ferritin 51.2 10.0 - 291.0 ng/mL  IBC panel  Result Value Ref Range   Iron 59 42 - 145 ug/dL   Transferrin 253.0 212.0 - 360.0 mg/dL   Saturation Ratios 16.7 (L) 20.0 - 50.0 %  VITAMIN D 25 Hydroxy (Vit-D Deficiency, Fractures)  Result Value Ref Range   VITD 44.84 30.00 - 100.00 ng/mL   *Note: Due to a large number of results and/or encounters for the requested time period, some results have not been displayed. A complete set of results can be found in Results Review.   Assessment &  Plan:  This  visit occurred during the SARS-CoV-2 public health emergency.  Safety protocols were in place, including screening questions prior to the visit, additional usage of staff PPE, and extensive cleaning of exam room while observing appropriate contact time as indicated for disinfecting solutions.   Problem List Items Addressed This Visit    Hypertension   Relevant Orders   Basic metabolic panel   TSH   OSA on CPAP    Stable period followed by pulmonology. She will call to schedule f/u as received letter she's due.       Venous stasis ulcer of left lower leg with edema of left lower leg (HCC)    Continues hydrocolloid dressing changes.      Somnolence - Primary    Ongoing daytime somnolence with naps leading to trouble sleeping at night time. Discussed that she doesn't have insomnia trouble but instead trouble with altered sleep schedule - discussed sleep hygiene measures, checklist provided. Update if not improved with this.           No orders of the defined types were placed in this encounter.  Orders Placed This Encounter  Procedures  . Basic metabolic panel  . TSH    Patient Instructions  Labs today.  Work on bedtime routine, limit daytime naps.  See below for sleeping checklist.   Sleep hygiene checklist: 1. Avoid naps during the day 2. Avoid stimulants such as caffeine and nicotine. Avoid bedtime alcohol (it can speed onset of sleep but the body's metabolism can cause awakenings). 3. All forms of exercise help ensure sound sleep - limit vigorous exercise to morning or late afternoon 4. Avoid food too close to bedtime including chocolate (which contains caffeine) 5. Soak up natural light 6. Establish regular bedtime routine. 7. Associate bed with sleep - avoid TV, computer or phone, reading while in bed. 8. Ensure pleasant, relaxing sleep environment - quiet, dark, cool room.    Follow up plan: Return if symptoms worsen or fail to improve.  [name], MD

## 2021-01-24 NOTE — Assessment & Plan Note (Signed)
Ongoing daytime somnolence with naps leading to trouble sleeping at night time. Discussed that she doesn't have insomnia trouble but instead trouble with altered sleep schedule - discussed sleep hygiene measures, checklist provided. Update if not improved with this.

## 2021-02-02 ENCOUNTER — Encounter: Payer: Self-pay | Admitting: Podiatry

## 2021-02-02 ENCOUNTER — Ambulatory Visit (INDEPENDENT_AMBULATORY_CARE_PROVIDER_SITE_OTHER): Payer: Medicare Other | Admitting: Podiatry

## 2021-02-02 ENCOUNTER — Other Ambulatory Visit: Payer: Self-pay

## 2021-02-02 DIAGNOSIS — B351 Tinea unguium: Secondary | ICD-10-CM

## 2021-02-02 DIAGNOSIS — M79674 Pain in right toe(s): Secondary | ICD-10-CM | POA: Diagnosis not present

## 2021-02-02 DIAGNOSIS — I872 Venous insufficiency (chronic) (peripheral): Secondary | ICD-10-CM

## 2021-02-02 DIAGNOSIS — M79675 Pain in left toe(s): Secondary | ICD-10-CM | POA: Diagnosis not present

## 2021-02-02 DIAGNOSIS — G629 Polyneuropathy, unspecified: Secondary | ICD-10-CM

## 2021-02-02 NOTE — Progress Notes (Signed)
This patient returns to my office for at risk foot care.  This patient requires this care by a professional since this patient will be at risk due to having chronic venous insufficiency.  Patient is wearing a bandage and compression sock left leg..  Wearing compression sock right leg..  Patient is taking trental.This patient is unable to cut nails herself  since the patient cannot reach her  nails.These nails are painful walking and wearing shoes.  This patient presents for at risk foot care today.  General Appearance  Alert, conversant and in no acute stress.  Vascular  Dorsalis pedis and posterior tibial pulses are palpable.    Neurologic  Senn-Weinstein  WNL   B/L.  Nails Thick disfigured discolored nails with subungual debris  from hallux to fifth toes bilaterally. No evidence of bacterial infection or drainage bilaterally. Pincer hallux nails  B/L.  Orthopedic  No limitations of motion  feet .  No crepitus or effusions noted.  No bony pathology or digital deformities noted. DJD [name]  B/L.  Skin  normotropic skin with no porokeratosis noted bilaterally.  No signs of infections or ulcers noted.   Unknown swelling dorsum of right foot.  Onychomycosis  Pain in right toes  Pain in left toes  Consent was obtained for treatment procedures.   Mechanical debridement of nails 1-5  bilaterally performed with a nail nipper.  Filed with dremel without incident. No infection or ulcer.     Return office visit   10  weeks        Told patient to return for periodic foot care and evaluation due to potential at risk complications.   Gardiner Barefoot DPM

## 2021-02-05 DIAGNOSIS — L0390 Cellulitis, unspecified: Secondary | ICD-10-CM | POA: Diagnosis not present

## 2021-02-05 DIAGNOSIS — I872 Venous insufficiency (chronic) (peripheral): Secondary | ICD-10-CM | POA: Diagnosis not present

## 2021-02-06 ENCOUNTER — Telehealth: Payer: Self-pay

## 2021-02-06 NOTE — Telephone Encounter (Signed)
Contacted pt who reports she contacted after hours nursing on Sat and Sun and they recommended on Sun that she go to UC. She went to UC in [name], [place], and they prescribed, Keflex x 7 days and refilled abx salve. Pt c/o swelling, inflamed and burning from leg with more pustules along the leg. She was concerned before getting the abx that there were bumps that may become pustules but she is hoping the abx will now stop the bumps from changing. She has contacted dermatology and has a f/u apt with them tomorrow. She denies any worsening of symptoms. Advised this nurse would contact UC to request records and let PCP know of her msg. Advised if anything worsens to contact office. Advised of ER precautions. Pt verbalized understanding and also voiced she has always been treated well by the after hours nursing line.   LVM with medical records at [name], at [phone] for them to fax office notes from 02/05/21.

## 2021-02-06 NOTE — Telephone Encounter (Signed)
Noted! Thank you

## 2021-02-06 NOTE — Telephone Encounter (Signed)
Received call from [facility] who requested fax requesting records and they will need to contact pt to get authorization to fax records to this office. They will call pt.  Faxed requested to [phone]

## 2021-02-06 NOTE — Telephone Encounter (Signed)
[facility] Night - Client TELEPHONE ADVICE RECORD [facility] Patient Name: [name] [place] Gender: Female DOB: [birth year] Age: 75 Y 58 M 29 D Return Phone Number: [phone] (Secondary) Address: City/ State/ Zip: [place] [address] Client [facility] Night - Client Client Site [place] Physician [name] - MD Contact Type Call Who Is Calling Patient / Member / Family / Caregiver Call Type Triage / Clinical Relationship To Patient Self Return Phone Number [phone] (Secondary) Chief Complaint Wound Infection Reason for Call Symptomatic / Request for Health Information Initial Comment Caller was dx last week with staph infection on left leg. She had a minor fall last night and scratched her left leg and there were the same little pustules as before. She washed and bandaged with her abx soaps and salves same as when she had impetigo and bandaged with non stick last night and hasn't changed or checked yet but now leg is starting to burn. Asks if she needs abx. Translation No Nurse Assessment Nurse: Amber Salvo, RN, Magda Paganini Date/Time [name] Time): 02/04/2021 3:24:23 PM Confirm and document reason for call. If symptomatic, describe symptoms. ---Caller states she had a minor fall last night and scratched her left leg and there were the same little pustules as she had before with staph. She washed and bandaged with her abx soaps and salves same as when she had impetigo and bandaged with non stick last night. Changed bandage which had some blood and puss, is starting to burn. Does the patient have any new or worsening symptoms? ---Yes Will a triage be completed? ---Yes Related visit to physician within the last 2 weeks? ---No Does the PT have any chronic conditions? (i.e. diabetes, asthma, this includes High risk factors for pregnancy, etc.) ---Yes List chronic conditions. ---stasis  dermatitis, Is this a behavioral health or substance abuse call? ---No Guidelines Guideline Title Affirmed Question Affirmed Notes Nurse Date/Time (Eastern Time) Wound Infection [1] Pus or cloudy fluid draining from [name] 02/04/2021 3:26:39 PM PLEASE NOTE: All timestamps contained within this report are represented as [place] Standard Time. CONFIDENTIALTY NOTICE: This fax transmission is intended only for the addressee. It contains information that is legally privileged, confidential or otherwise protected from use or disclosure. If you are not the intended recipient, you are strictly prohibited from reviewing, disclosing, copying using or disseminating any of this information or taking any action in reliance on or regarding this information. If you have received this fax in error, please notify us immediately by telephone so that we can arrange for its return to [place]. Phone: [phone], Toll-Free: [phone], Fax: [phone] Page: 2 of 4 Call Id: [id number] Guidelines Guideline Title Affirmed Question Affirmed Notes Nurse Date/Time [name] Time) wound AND [2] no fever Disp. Time [name] Time) Disposition Final User 02/04/2021 3:32:09 PM See PCP within 24 Hours Yes Amber Salvo, RN, Christa See Disagree/Comply Comply Caller Understands Yes PreDisposition Call Doctor Care Advice Given Per Guideline SEE PCP WITHIN 24 HOURS: * IF OFFICE WILL BE CLOSED: You need to be seen within the next 24 hours. A clinic or an urgent care center is often a good source of care if your doctor's office is closed or you can't get an appointment. WARM SOAKS OR LOCAL HEAT: * If wound is OPEN, soak in warm water or put warm wet cloth on wound for 20 min three times a day. Use a warm saline solution containing 2 tsp. table salt per quart (1  liter) of water. ANTIBIOTIC OINTMENT: * Apply an over-thecounter antibiotic ointment (e.g., Bacitracin) 3 times a day. * If the area could become dirty, cover with  an adhesive bandage (such as a Band-Aid) or a gauze dressing. CALL BACK IF: * Fever occurs * You become worse CARE ADVICE per Wound Infection (Adult) guideline. Referrals REFERRED TO PCP OFFICE Triage Details User: [name] Date/Time: 02/04/2021 3:26:39 PM Triage Guideline: Wound Infection [1] Widespread rash AND [2] bright red, sunburn-like AND [3] too weak to stand -----No Sounds like a life-threatening emergency to the triager -----No Stitches (or staples) and not infected -----No Skin glue used to close wound and not infected -----No C-section surgical wound infection suspected PLEASE NOTE: All timestamps contained within this report are represented as [place] Standard Time. CONFIDENTIALTY NOTICE: This fax transmission is intended only for the addressee. It contains information that is legally privileged, confidential or otherwise protected from use or disclosure. If you are not the intended recipient, you are strictly prohibited from reviewing, disclosing, copying using or disseminating any of this information or taking any action in reliance on or regarding this information. If you have received this fax in error, please notify us immediately by telephone so that we can arrange for its return to [place]. Phone: [phone], Toll-Free: [phone], Fax: [phone] Page: 3 of 4 Call Id: [id number] Triage Details -----No Surgical wound infection suspected (post-op) -----No Animal bite wound infection suspected -----No Chronic wound care and Negative Pressure Wound Therapy (NPWT) symptoms and questions -----No [1] Widespread rash AND [2] bright red, sunburn-like -----No SEVERE pain in the wound -----No Black (necrotic) or blisters develop in wound -----No Patient sounds very sick or weak to the triager -----No [1] Looks infected (spreading redness, red streak, pus) AND [2] fever -----No [1] Red streak runs from the wound AND [2] longer than 1 inch (2.5  cm) -----No [1] Skin around the wound has become red AND [2] larger than 2 inches (5 cm) -----No [1] Face wound AND [2] looks infected (e.g., spreading redness) -----No [1] Finger wound AND [2] entire finger swollen -----No [1] Red area or streak AND [2] no fever -----No [1] Pus or cloudy fluid draining from wound AND [2] no fever -----Yes Disposition: See PCP within 24 Hours SEE PCP WITHIN 24 HOURS: * IF OFFICE WILL BE CLOSED: You need to be seen within the next 24 hours. A clinic or an urgent care center  is often a good source of care if your doctor's office is closed or you can't get an appointment. PLEASE NOTE: All timestamps contained within this report are represented as [place] Standard Time. CONFIDENTIALTY NOTICE: This fax transmission is intended only for the addressee. It contains information that is legally privileged, confidential or otherwise protected from use or disclosure. If you are not the intended recipient, you are strictly prohibited from reviewing, disclosing, copying using or disseminating any of this information or taking any action in reliance on or regarding this information. If you have received this fax in error, please notify us immediately by telephone so that we can arrange for its return to [place]. Phone: [phone], Toll-Free: [phone], Fax: [phone] Page: 4 of 4 Call Id: [id number] Triage Details WARM SOAKS OR LOCAL HEAT: * If wound is OPEN, soak in warm water or put warm wet cloth on wound for 20 min three times a day. Use a  warm saline solution containing 2 tsp. table salt per quart (1 liter) of water. ANTIBIOTIC OINTMENT: * Apply an over-the-counter antibiotic ointment (e.g.,  Bacitracin) 3 times a day. * If the area could become dirty, cover with an adhesive bandage (such as a Band-Aid) or a gauze dressing. CALL BACK IF: * Fever occurs * You become worse CARE ADVICE per Wound Infection (Adult) guideline.

## 2021-02-06 NOTE — Telephone Encounter (Signed)
[facility] Night - Client TELEPHONE ADVICE RECORD [facility] Patient Name: [name] [place] Gender: Female DOB: [birth year] Age: 75 Y 31 M 30 D Return Phone Number: [phone] (Secondary) Address: City/ State/ Zip: [place] [address] Client [facility] Night - Client Client Site [place] Physician [name] - MD Contact Type Call Who Is Calling Patient / Member / Family / Caregiver Call Type Triage / Clinical Relationship To Patient Self Return Phone Number [phone] (Secondary) Chief Complaint Wound Infection Reason for Call Symptomatic / Request for [name] has some sores on her left leg and hx of staph. These sores look similar to the lesions she had when she had staph. Translation No Nurse Assessment Nurse: Windle Guard, RN, Lesa Date/Time (Eastern Time): 02/05/2021 1:26:48 PM Confirm and document reason for call. If symptomatic, describe symptoms. ---Caller states she has sores on her left leg that look like impetigo Does the patient have any new or worsening symptoms? ---Yes Will a triage be completed? ---Yes Related visit to physician within the last 2 weeks? ---No Does the PT have any chronic conditions? (i.e. diabetes, asthma, this includes High risk factors for pregnancy, etc.) ---Yes List chronic conditions. ---HTN, sleep apnea, allergy induced asthma Is this a behavioral health or substance abuse call? ---No Guidelines Guideline Title Affirmed Question Affirmed Notes Nurse Date/Time (Eastern Time) Sores [1] Looks infected (spreading redness, pus) AND [2] large red area (> 2 in. or 5 cm) Conner, RN, Lesa 02/05/2021 1:29:15 PM Disp. Time [name] Time) Disposition Final User 02/05/2021 1:33:06 PM See HCP within 4 Hours (or PCP triage) Yes Conner, RN, Lesa PLEASE NOTE: All timestamps contained within this report are represented as  [place] Standard Time. CONFIDENTIALTY NOTICE: This fax transmission is intended only for the addressee. It contains information that is legally privileged, confidential or otherwise protected from use or disclosure. If you are not the intended recipient, you are strictly prohibited from reviewing, disclosing, copying using or disseminating any of this information or taking any action in reliance on or regarding this information. If you have received this fax in error, please notify us immediately by telephone so that we can arrange for its return to [place]. Phone: [phone], Toll-Free: [phone], Fax: [phone] Page: 2 of 3 Call Id: [id number] [place] Disagree/Comply Comply Caller Understands Yes PreDisposition Did not know what to do Care Advice Given Per Guideline SEE HCP (OR PCP TRIAGE) WITHIN 4 HOURS: * IF OFFICE WILL BE CLOSED AND NO PCP (PRIMARY CARE PROVIDER) SECOND-LEVEL TRIAGE: You need to be seen within the next 3 or 4 hours. A nearby Urgent Care Center [facility]) is often a good source of care. Another choice is to go to the ED. Go sooner if you become worse. CALL BACK IF: * You become worse CARE ADVICE given per Sores (Adult) guideline. Comments User: Starleen Blue, RN Date/Time [name] Time): 02/05/2021 1:33:39 PM Caller disconnected prior to closing script Referrals [facility] [place] Triage Details User: Windle Guard RN, Lesa Date/Time: 02/05/2021 1:29:15 PM Triage Guideline: Sores Sounds like a life-threatening emergency to the triager -----No Followed an injury (i.e., from an abrasion or scratch) -----No Wound infection suspected (in traumatic or surgical wound) -----No Soft yellow scabs (crusts) -----No Followed a burn -----No Looks like insect bite -----No Looks like chickenpox -----No On one side of the lip PLEASE NOTE: All timestamps contained within this report are represented as [place] Standard Time. CONFIDENTIALTY  NOTICE: This fax  transmission is intended only for the addressee. It contains information that is legally privileged, confidential or otherwise protected from use or disclosure. If you are not the intended recipient, you are strictly prohibited from reviewing, disclosing, copying using or disseminating any of this information or taking any action in reliance on or regarding this information. If you have received this fax in error, please notify us immediately by telephone so that we can arrange for its return to [place]. Phone: [phone], Toll-Free: [phone], Fax: [phone] Page: 3 of 3 Call Id: [id number] Triage Details -----No Looks like poison ivy -----No Looks like ringworm -----No Patient sounds very sick or weak to the triager -----No [1] Red area or streak AND [2] fever -----No [1] Looks infected (spreading redness, pus) AND [2] large red area (> 2 in. or 5 cm) -----Yes Disposition: See HCP within 4 Hours (or PCP triage) SEE HCP (OR PCP TRIAGE) WITHIN 4 HOURS: * IF OFFICE WILL BE CLOSED AND NO PCP (PRIMARY CARE PROVIDER) SECOND-LEVEL TRIAGE:  You need to be seen within the next 3 or 4 hours. A nearby Urgent Care Center [facility]) is often a good source of  care. Another choice is to go to the ED. Go sooner if you become worse. CALL BACK IF: * You become worse CARE ADVICE given per Sores (Adult) guideline.

## 2021-02-07 DIAGNOSIS — I872 Venous insufficiency (chronic) (peripheral): Secondary | ICD-10-CM | POA: Diagnosis not present

## 2021-02-07 DIAGNOSIS — I8311 Varicose veins of right lower extremity with inflammation: Secondary | ICD-10-CM | POA: Diagnosis not present

## 2021-02-07 DIAGNOSIS — I8312 Varicose veins of left lower extremity with inflammation: Secondary | ICD-10-CM | POA: Diagnosis not present

## 2021-02-07 DIAGNOSIS — Z85820 Personal history of malignant melanoma of skin: Secondary | ICD-10-CM | POA: Diagnosis not present

## 2021-02-07 DIAGNOSIS — Z85828 Personal history of other malignant neoplasm of skin: Secondary | ICD-10-CM | POA: Diagnosis not present

## 2021-02-14 NOTE — Telephone Encounter (Signed)
Records reviewed. Date of service 02/05/2021.  Placed on keflex 500mg  QID 10d course + topical mupirocin.

## 2021-02-14 NOTE — Telephone Encounter (Signed)
Received UC note from [facility] and placed in Dr. Darnell Level inbox.

## 2021-02-16 DIAGNOSIS — Z85828 Personal history of other malignant neoplasm of skin: Secondary | ICD-10-CM | POA: Diagnosis not present

## 2021-02-16 DIAGNOSIS — I83222 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 of calf and inflammation: Secondary | ICD-10-CM | POA: Diagnosis not present

## 2021-02-16 DIAGNOSIS — Z85820 Personal history of malignant melanoma of skin: Secondary | ICD-10-CM | POA: Diagnosis not present

## 2021-03-16 DIAGNOSIS — I83222 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 of calf and inflammation: Secondary | ICD-10-CM | POA: Diagnosis not present

## 2021-03-16 DIAGNOSIS — Z85820 Personal history of malignant melanoma of skin: Secondary | ICD-10-CM | POA: Diagnosis not present

## 2021-03-16 DIAGNOSIS — Z85828 Personal history of other malignant neoplasm of skin: Secondary | ICD-10-CM | POA: Diagnosis not present

## 2021-03-23 ENCOUNTER — Ambulatory Visit: Payer: Medicare Other | Admitting: Pulmonary Disease

## 2021-03-24 ENCOUNTER — Ambulatory Visit (INDEPENDENT_AMBULATORY_CARE_PROVIDER_SITE_OTHER): Payer: Medicare Other | Admitting: Family Medicine

## 2021-03-24 ENCOUNTER — Encounter: Payer: Self-pay | Admitting: Family Medicine

## 2021-03-24 ENCOUNTER — Other Ambulatory Visit: Payer: Self-pay

## 2021-03-24 VITALS — BP 130/78 | HR 75 | Temp 98.4°F | Ht 63.0 in | Wt 217.2 lb

## 2021-03-24 DIAGNOSIS — L97929 Non-pressure chronic ulcer of unspecified part of left lower leg with unspecified severity: Secondary | ICD-10-CM | POA: Diagnosis not present

## 2021-03-24 DIAGNOSIS — I83892 Varicose veins of left lower extremities with other complications: Secondary | ICD-10-CM | POA: Diagnosis not present

## 2021-03-24 DIAGNOSIS — R609 Edema, unspecified: Secondary | ICD-10-CM | POA: Diagnosis not present

## 2021-03-24 DIAGNOSIS — I872 Venous insufficiency (chronic) (peripheral): Secondary | ICD-10-CM

## 2021-03-24 DIAGNOSIS — I83029 Varicose veins of left lower extremity with ulcer of unspecified site: Secondary | ICD-10-CM | POA: Diagnosis not present

## 2021-03-24 DIAGNOSIS — I83893 Varicose veins of bilateral lower extremities with other complications: Secondary | ICD-10-CM

## 2021-03-24 NOTE — Patient Instructions (Signed)
I do agree with returning to see the wound clinic - call today to schedule an appointment. Let us know if spreading redness or worsening pain or enlarging would prior to your next visit.

## 2021-03-24 NOTE — Progress Notes (Signed)
Patient ID: [name], female    DOB: Mar 22, 1946, 75 y.o.   MRN: [id number]  This visit was conducted in person.  BP 130/78   Pulse 75   Temp 98.4 F (36.9 C) (Temporal)   Ht 5\' 3"  (1.6 m)   Wt 217 lb 3 oz (98.5 kg)   SpO2 97%   BMI 38.47 kg/m    CC: L leg swelling  Subjective:   HPI: [name] is a 75 y.o. female presenting on 03/24/2021 for Leg Swelling (C/o left lower leg swelling.  Started a little less than 1 wk. )   1 wk h/o LLE swelling.   H/o poorly healing ulcers 11/2020, treated for impetigo as well. Saw derm - she has been treating wounds with hydrocolloid bandages and restarted keflex course + mupirocin cream Saw dermatology (Dr Ronnald Ramp) last week - infection largely better but wound has not healed well - recommended return to wound clinic. Advised to continue compression wraps. Wanted to get my opinion. Doesn't like unna boots - as she feels they rub on leg and can cause sore.  Continues lasix 40mg  daily along with losartan 50mg  daily.   Most recently treated for LLE cellulitis with keflex course as well as mupirocin abx ointments at [facility] (02/2021).      Relevant past medical, surgical, family and social history reviewed and updated as indicated. Interim medical history since our last visit reviewed. Allergies and medications reviewed and updated. Outpatient Medications Prior to Visit  Medication Sig Dispense Refill  . acetaminophen (TYLENOL) 650 MG CR tablet Take 1,300 mg by mouth every 8 (eight) hours as needed for pain.     Marland Kitchen albuterol (VENTOLIN HFA) 108 (90 Base) MCG/ACT inhaler INHALE 2 PUFFS BY MOUTH EVERY 6 HORUS AS NEEDED FOR WHEEZING/SHORTNESS OF BREATH 8.5 g 3  . aspirin EC 81 MG tablet Take 81 mg by mouth at bedtime. Melanoma prevention    . furosemide (LASIX) 40 MG tablet Take 1 tablet (40 mg total) by mouth 2 (two) times daily as needed for fluid. Take second dose if needed for leg swelling 60 tablet 6  . KLOR-CON M20 20 MEQ tablet TAKE 1 TABLET  BY MOUTH TWICE A DAY 60 tablet 5  . loratadine (CLARITIN) 10 MG tablet Take 10 mg by mouth daily.    Marland Kitchen losartan (COZAAR) 25 MG tablet TAKE 2 TABLETS BY MOUTH EVERY DAY 60 tablet 5  . meloxicam (MOBIC) 15 MG tablet TAKE 1 TABLET BY MOUTH EVERY DAY AS NEEDED FOR PAIN 30 tablet 1  . montelukast (SINGULAIR) 10 MG tablet TAKE 1 TABLET BY MOUTH EVERY DAY 30 tablet 3  . Multiple Vitamin (MULTIVITAMIN WITH MINERALS) TABS tablet Take 1 tablet by mouth daily.    . Polyvinyl Alcohol-Povidone (TEARS PLUS OP) Place 1 drop into both eyes daily as needed (dry eyes/ redness/ burning).     Marland Kitchen PRESCRIPTION MEDICATION CPAP    . Probiotic Product (PROBIOTIC DAILY PO) Take 1 tablet by mouth daily.     Marland Kitchen triamcinolone (NASACORT) 55 MCG/ACT AERO nasal inhaler Place 2 sprays into the nose daily. In each nostril    . triamcinolone cream (KENALOG) 0.1 % Apply 1 application topically 2 (two) times daily. Apply to AA. Max 2 weeks at a time. 80 g 1  . UNABLE TO FIND Take by mouth.    . Vitamin D, Ergocalciferol, (DRISDOL) 1.25 MG (50000 UNIT) CAPS capsule TAKE 1 CAPSULE BY MOUTH EVERY 7 DAYS 12 capsule 3  .  famotidine (PEPCID) 20 MG tablet Take 1 tablet (20 mg total) by mouth at bedtime.     No facility-administered medications prior to visit.     Per HPI unless specifically indicated in ROS section below Review of Systems Objective:  BP 130/78   Pulse 75   Temp 98.4 F (36.9 C) (Temporal)   Ht 5\' 3"  (1.6 m)   Wt 217 lb 3 oz (98.5 kg)   SpO2 97%   BMI 38.47 kg/m   Wt Readings from Last 3 Encounters:  03/24/21 217 lb 3 oz (98.5 kg)  01/24/21 218 lb 12.8 oz (99.2 kg)  12/12/20 217 lb 9 oz (98.7 kg)      Physical Exam Vitals and nursing note reviewed.  Constitutional:      Appearance: Normal appearance. She is obese. She is not ill-appearing.  Musculoskeletal:        General: Normal range of motion.     Right lower leg: Edema present.     Left lower leg: Edema present.     Comments:  Nonpitting edema  L>R 1+ DP bilaterally  Skin:    General: Skin is warm.     Findings: Wound present. No erythema or rash.     Comments: Punched out chronic appearing 2.5cm ulcer present to medial left leg without surrounding erythema or drainage  Neurological:     Mental Status: She is alert.       Results for orders placed or performed in visit on 66/06/30  Basic metabolic panel  Result Value Ref Range   Sodium 143 135 - 145 mEq/L   Potassium 4.1 3.5 - 5.1 mEq/L   Chloride 107 96 - 112 mEq/L   CO2 31 19 - 32 mEq/L   Glucose, Bld 96 70 - 99 mg/dL   BUN 15 6 - 23 mg/dL   Creatinine, Ser 0.67 0.40 - 1.20 mg/dL   GFR 86.03 >60.00 mL/min   Calcium 9.4 8.4 - 10.5 mg/dL  TSH  Result Value Ref Range   TSH 2.69 0.35 - 4.50 uIU/mL   *Note: Due to a large number of results and/or encounters for the requested time period, some results have not been displayed. A complete set of results can be found in Results Review.   Assessment & Plan:  This visit occurred during the SARS-CoV-2 public health emergency.  Safety protocols were in place, including screening questions prior to the visit, additional usage of staff PPE, and extensive cleaning of exam room while observing appropriate contact time as indicated for disinfecting solutions.   Problem List Items Addressed This Visit    Chronic venous insufficiency - Primary   Varicose veins of both lower extremities with complications   Venous stasis ulcer of left lower leg with edema of left lower leg (HCC)    Chronic presumed venous wound to L medial leg not healing despite regular dressing changes and recent abx course through derm. Agree with referral to wound clinic as next step - she will call today to schedule appointment, states she doesn't need referral as she's an established patient with ongoing open wound.  Advised she won't qualify for home health as she's not homebound.           No orders of the defined types were placed in this encounter.  No  orders of the defined types were placed in this encounter.   Patient Instructions  I do agree with returning to see the wound clinic - call today to schedule an appointment. Let  us know if spreading redness or worsening pain or enlarging would prior to your next visit.    Follow up plan: Return if symptoms worsen or fail to improve.  [name], MD

## 2021-03-24 NOTE — Assessment & Plan Note (Signed)
Chronic presumed venous wound to L medial leg not healing despite regular dressing changes and recent abx course through derm. Agree with referral to wound clinic as next step - she will call today to schedule appointment, states she doesn't need referral as she's an established patient with ongoing open wound.  Advised she won't qualify for home health as she's not homebound.

## 2021-03-27 ENCOUNTER — Telehealth: Payer: Self-pay

## 2021-03-27 NOTE — Telephone Encounter (Signed)
Noted! Thank you

## 2021-03-27 NOTE — Telephone Encounter (Signed)
Patient called back to let Dr Darnell Level know that she called [facility]-she is a former patient there, and she is scheduled for June 1st to be seen for her leg. Wanted Dr Darnell Level to know.

## 2021-04-05 ENCOUNTER — Ambulatory Visit: Payer: Medicare Other | Admitting: Internal Medicine

## 2021-04-12 ENCOUNTER — Other Ambulatory Visit: Payer: Self-pay

## 2021-04-12 ENCOUNTER — Encounter: Payer: Medicare Other | Attending: Internal Medicine | Admitting: Internal Medicine

## 2021-04-12 DIAGNOSIS — I87332 Chronic venous hypertension (idiopathic) with ulcer and inflammation of left lower extremity: Secondary | ICD-10-CM | POA: Insufficient documentation

## 2021-04-12 DIAGNOSIS — L97221 Non-pressure chronic ulcer of left calf limited to breakdown of skin: Secondary | ICD-10-CM | POA: Insufficient documentation

## 2021-04-12 DIAGNOSIS — L97222 Non-pressure chronic ulcer of left calf with fat layer exposed: Secondary | ICD-10-CM | POA: Diagnosis not present

## 2021-04-12 NOTE — Progress Notes (Signed)
Amber, [name] ([id number]) Visit Report for 04/12/2021 Abuse/Suicide Risk Screen Details Patient Name: [name], [name]. Date of Service: 04/12/2021 12:45 PM Medical Record Number: [id number] Patient Account Number: [id number] Date of Birth/Sex: [birth year] (75 y.o. F) Treating RN: Donnamarie Poag Primary Care Lucine Bilski: [name] Other Clinician: Referring Houda Brau: [name] Treating Ulys Favia/Extender: Tito Dine in Treatment: 0 Abuse/Suicide Risk Screen Items Answer ABUSE RISK SCREEN: Has anyone close to you tried to hurt or harm you recentlyo No Do you feel uncomfortable with anyone in your familyo No Has anyone forced you do things that you didnot want to doo No Electronic Signature(s) Signed: 04/12/2021 2:11:15 PM By: Donnamarie Poag Entered By: Donnamarie Poag on 04/12/2021 13:22:57 Maniscalco, [name] ([id number]) -------------------------------------------------------------------------------- Activities of Daily Living Details Patient Name: Amber, [name]. Date of Service: 04/12/2021 12:45 PM Medical Record Number: [id number] Patient Account Number: [id number] Date of Birth/Sex: 22-Mar-1946 (75 y.o. F) Treating RN: Donnamarie Poag Primary Care Caelen Reierson: [name] Other Clinician: Referring Parilee [name]: [name] Treating Dakotah Heiman/Extender: Tito Dine in Treatment: 0 Activities of Daily Living Items Answer Activities of Daily Living (Please select one for each item) Drive Automobile Completely Able Take Medications Completely Able Use Telephone Completely Able Care for Appearance Completely Able Use Toilet Completely Able Bath / Shower Completely Able Dress Self Completely Able Feed Self Completely Able Walk Completely Able Get In / Out Bed Completely Able Housework Completely Able Prepare Meals Completely Able Handle Money Completely Able Shop for Self Completely Able Electronic Signature(s) Signed: 04/12/2021 2:11:15 PM By: Donnamarie Poag Entered By: Donnamarie Poag on 04/12/2021 13:23:22 Yanko, [name] ([id number]) -------------------------------------------------------------------------------- Education Screening Details Patient Name: TAMEEKA, [name]. Date of Service: 04/12/2021 12:45 PM Medical Record Number: [id number] Patient Account Number: [id number] Date of Birth/Sex: Feb 15, 1946 (75 y.o. F) Treating RN: Donnamarie Poag Primary Care Gelene Recktenwald: [name] Other Clinician: Referring Kaziyah Parkison: [name] Treating Rustin Erhart/Extender: Tito Dine in Treatment: 0 Primary Learner Assessed: Patient Learning Preferences/Education Level/Primary Language Learning Preference: Explanation Highest Education Level: College or Above Preferred Language: English Cognitive Barrier Language Barrier: No Translator Needed: No Memory Deficit: No Emotional Barrier: No Cultural/Religious Beliefs Affecting Medical Care: No Physical Barrier Impaired Vision: No Impaired Hearing: No Decreased Hand dexterity: No Knowledge/Comprehension Knowledge Level: High Comprehension Level: High Ability to understand written instructions: High Ability to understand verbal instructions: High Motivation Anxiety Level: Calm Cooperation: Cooperative Education Importance: Acknowledges Need Interest in Health Problems: Asks Questions Perception: Coherent Willingness to Engage in Self-Management High Activities: Readiness to Engage in Self-Management High Activities: Electronic Signature(s) Signed: 04/12/2021 2:11:15 PM By: Donnamarie Poag Entered By: Donnamarie Poag on 04/12/2021 13:23:43 Pinkley, [name] ([id number]) -------------------------------------------------------------------------------- Fall Risk Assessment Details Patient Name: [name]. Date of Service: 04/12/2021 12:45 PM Medical Record Number: [id number] Patient Account Number: [id number] Date of Birth/Sex: Nov 17, 1945 (75 y.o. F) Treating RN: Donnamarie Poag Primary  Care Takeya Marquis: [name] Other Clinician: Referring [name] Bones: [name] Treating Coyle Stordahl/Extender: Tito Dine in Treatment: 0 Fall Risk Assessment Items Have you had 2 or more falls in the last 12 monthso 0 No Have you had any fall that resulted in injury in the last 12 monthso 0 No FALLS RISK SCREEN History of falling - immediate or within 3 months 0 No Secondary diagnosis (Do you have 2 or more medical diagnoseso) 0 No Ambulatory aid None/bed rest/wheelchair/nurse 0 Yes Crutches/cane/walker 0 No Furniture 0 No Intravenous therapy Access/Saline/Heparin Lock 0 No Gait/Transferring Normal/ bed rest/ wheelchair 0 Yes Weak (short steps  with or without shuffle, stooped but able to lift head while walking, may 0 No seek support from furniture) Impaired (short steps with shuffle, may have difficulty arising from chair, head down, impaired 0 No balance) Mental Status Oriented to own ability 0 Yes Electronic Signature(s) Signed: 04/12/2021 2:11:15 PM By: Donnamarie Poag Entered By: Donnamarie Poag on 04/12/2021 13:24:24 Weide, [name] ([id number]) -------------------------------------------------------------------------------- Foot Assessment Details Patient Name: [name], Amber. Date of Service: 04/12/2021 12:45 PM Medical Record Number: [id number] Patient Account Number: [id number] Date of Birth/Sex: 1946-02-24 (75 y.o. F) Treating RN: Donnamarie Poag Primary Care Kathya Wilz: [name] Other Clinician: Referring Jailin Moomaw: [name] Treating Rochella Benner/Extender: Tito Dine in Treatment: 0 Foot Assessment Items Site Locations + = Sensation present, - = Sensation absent, C = Callus, U = Ulcer R = Redness, W = Warmth, M = Maceration, PU = Pre-ulcerative lesion F = Fissure, S = Swelling, D = Dryness Assessment Right: Left: Other Deformity: No No Prior Foot Ulcer: No No Prior Amputation: No No Charcot Joint: No No Ambulatory Status:  Ambulatory Without Help Gait: Steady Electronic Signature(s) Signed: 04/12/2021 2:11:15 PM By: Donnamarie Poag Entered By: Donnamarie Poag on 04/12/2021 13:25:42 Erway, [name] ([id number]) -------------------------------------------------------------------------------- Nutrition Risk Screening Details Patient Name: KATLYNE, NISHIDA. Date of Service: 04/12/2021 12:45 PM Medical Record Number: [id number] Patient Account Number: [id number] Date of Birth/Sex: Jul 12, 1946 (75 y.o. F) Treating RN: Donnamarie Poag Primary Care Griff Badley: [name] Other Clinician: Referring Ridgely Anastacio: [name] Treating Jasmane Brockway/Extender: Tito Dine in Treatment: 0 Height (in): 63 Weight (lbs): 212 Body Mass Index (BMI): 37.6 Nutrition Risk Screening Items Score Screening NUTRITION RISK SCREEN: I have an illness or condition that made me change the kind and/or amount of food I eat 0 No I eat fewer than two meals per day 0 No I eat few fruits and vegetables, or milk products 0 No I have three or more drinks of beer, liquor or wine almost every day 0 No I have tooth or mouth problems that make it hard for me to eat 0 No I don't always have enough money to buy the food I need 0 No I eat alone most of the time 0 No I take three or more different prescribed or over-the-counter drugs a day 1 Yes Without wanting to, I have lost or gained 10 pounds in the last six months 0 No I am not always physically able to shop, cook and/or feed myself 0 No Nutrition Protocols Good Risk Protocol 0 No interventions needed Moderate Risk Protocol High Risk Proctocol Risk Level: Good Risk Score: 1 Electronic Signature(s) Signed: 04/12/2021 2:11:15 PM By: Donnamarie Poag Entered ByDonnamarie Poag on 04/12/2021 13:24:32

## 2021-04-12 NOTE — Progress Notes (Addendum)
Amber, [name] ([id number]) Visit Report for 04/12/2021 Allergy List Details Patient Name: [name], [name]. Date of Service: 04/12/2021 12:45 PM Medical Record Number: [id number] Patient Account Number: [id number] Date of Birth/Sex: 1946/08/28 (75 y.o. F) Treating RN: Donnamarie Poag Primary Care Halford Goetzke: [name] Other Clinician: Referring Janie Capp: [name] Treating Zetta Stoneman/Extender: Tito Dine in Treatment: 0 Allergies Active Allergies Sulfa (Sulfonamide Antibiotics) Reaction: facial swelling latex Neoprene Voltaren Reaction: wheezing asthma symptoms Severity: Moderate doxycycline Reaction: gi Allergy Notes Electronic Signature(s) Signed: 04/12/2021 2:11:15 PM By: Donnamarie Poag Entered By: Donnamarie Poag on 04/12/2021 13:17:38 Goodhart, [name] ([id number]) -------------------------------------------------------------------------------- Arrival Information Details Patient Name: Amber, [name]. Date of Service: 04/12/2021 12:45 PM Medical Record Number: [id number] Patient Account Number: [id number] Date of Birth/Sex: 1946/03/05 (75 y.o. F) Treating RN: Donnamarie Poag Primary Care Marcques Wrightsman: [name] Other Clinician: Referring Murriel Holwerda: [name] Treating Erika Slaby/Extender: Tito Dine in Treatment: 0 Visit Information Patient Arrived: Ambulatory Arrival Time: 13:15 Accompanied By: self Transfer Assistance: None Patient Identification Verified: Yes Secondary Verification Process Completed: Yes Patient Has Alerts: Yes Patient Alerts: Patient on Blood Thinner ASPIRIN NOT diabetic History Since Last Visit Electronic Signature(s) Signed: 04/12/2021 2:11:15 PM By: Donnamarie Poag Entered By: Donnamarie Poag on 04/12/2021 13:16:15 Peckenpaugh, [name] ([id number]) -------------------------------------------------------------------------------- Clinic Level of Care Assessment Details Patient Name: Amber, [name]. Date of Service: 04/12/2021 12:45  PM Medical Record Number: [id number] Patient Account Number: [id number] Date of Birth/Sex: August 01, 1946 (75 y.o. F) Treating RN: Cornell Barman Primary Care Tyronica Truxillo: [name] Other Clinician: Referring Cylah Fannin: [name] Treating Yaire Kreher/Extender: Tito Dine in Treatment: 0 Clinic Level of Care Assessment Items TOOL 1 Quantity Score []  - Use when EandM and Procedure is performed on INITIAL visit 0 ASSESSMENTS - Nursing Assessment / Reassessment X - General Physical Exam (combine w/ comprehensive assessment (listed just below) when performed on new 1 20 pt. evals) X- 1 25 Comprehensive Assessment (HX, ROS, Risk Assessments, Wounds Hx, etc.) ASSESSMENTS - Wound and Skin Assessment / Reassessment []  - Dermatologic / Skin Assessment (not related to wound area) 0 ASSESSMENTS - Ostomy and/or Continence Assessment and Care []  - Incontinence Assessment and Management 0 []  - 0 Ostomy Care Assessment and Management (repouching, etc.) PROCESS - Coordination of Care X - Simple Patient / Family Education for ongoing care 1 15 []  - 0 Complex (extensive) Patient / Family Education for ongoing care X- 1 10 Staff obtains Consents, Records, Test Results / Process Orders []  - 0 Staff telephones HHA, Nursing Homes / Clarify orders / etc []  - 0 Routine Transfer to another Facility (non-emergent condition) []  - 0 Routine Hospital Admission (non-emergent condition) X- 1 15 New Admissions / Biomedical engineer / Ordering NPWT, Apligraf, etc. []  - 0 Emergency Hospital Admission (emergent condition) PROCESS - Special Needs []  - Pediatric / Minor Patient Management 0 []  - 0 Isolation Patient Management []  - 0 Hearing / Language / Visual special needs []  - 0 Assessment of Community assistance (transportation, D/C planning, etc.) []  - 0 Additional assistance / Altered mentation []  - 0 Support Surface(s) Assessment (bed, cushion, seat, etc.) INTERVENTIONS -  Miscellaneous []  - External ear exam 0 []  - 0 Patient Transfer (multiple staff / Civil Service fast streamer / Similar devices) []  - 0 Simple Staple / Suture removal (25 or less) []  - 0 Complex Staple / Suture removal (26 or more) []  - 0 Hypo/Hyperglycemic Management (do not check if billed separately) X- 1 15 Ankle / Brachial Index (ABI) - do not check  if billed separately Has the patient been seen at the hospital within the last three years: Yes Total Score: 100 Level Of Care: New/Established - Level 3 BRYNDLE, CORREDOR ([id number]) Electronic Signature(s) Signed: 04/12/2021 4:36:28 PM By: [name], [name], RN, CWS, [name], [name] Entered By: [name], [name], RN, CWS, Kim on 04/12/2021 14:10:48 [name] ([id number]) -------------------------------------------------------------------------------- Compression Therapy Details Patient Name: Amber, [name]. Date of Service: 04/12/2021 12:45 PM Medical Record Number: [id number] Patient Account Number: [id number] Date of Birth/Sex: 1946/02/11 (75 y.o. F) Treating RN: Donnamarie Poag Primary Care Zena Vitelli: [name] Other Clinician: Referring Royden Bulman: [name] Treating Noe Pittsley/Extender: Tito Dine in Treatment: 0 Compression Therapy Performed for Wound Assessment: Wound #13 Left,Distal,Posterior Lower Leg Performed By: Junius Argyle, RN Compression Type: Three Layer Pre Treatment ABI: 0.9 Post Procedure Diagnosis Same as Pre-procedure Electronic Signature(s) Signed: 04/12/2021 2:11:15 PM By: Donnamarie Poag Entered By: Donnamarie Poag on 04/12/2021 14:05:14 Fusaro, [name] ([id number]) -------------------------------------------------------------------------------- Encounter Discharge Information Details Patient Name: Amber, [name]. Date of Service: 04/12/2021 12:45 PM Medical Record Number: [id number] Patient Account Number: [id number] Date of Birth/Sex: [birth year] (75 y.o. F) Treating RN: Dolan Amen Primary Care Tahmir Kleckner:  [name] Other Clinician: Referring Jaskirat Zertuche: [name] Treating Mickeal Daws/Extender: Tito Dine in Treatment: 0 Encounter Discharge Information Items Discharge Condition: Stable Ambulatory Status: Ambulatory Discharge Destination: Home Transportation: Private Auto Accompanied By: self Schedule Follow-up Appointment: Yes Clinical Summary of Care: Electronic Signature(s) Signed: 04/12/2021 4:25:56 PM By: Georges Mouse, Minus Breeding RN Entered By: Georges Mouse, Minus Breeding on 04/12/2021 14:24:41 [name] ([id number]) -------------------------------------------------------------------------------- Lower Extremity Assessment Details Patient Name: MADELEIN, MAHADEO. Date of Service: 04/12/2021 12:45 PM Medical Record Number: [id number] Patient Account Number: [id number] Date of Birth/Sex: 06-[name]-1946 (75 y.o. F) Treating RN: Donnamarie Poag Primary Care Dyani Babel: [name] Other Clinician: Referring Genese Quebedeaux: [name] Treating [name] Meadow/Extender: Tito Dine in Treatment: 0 Edema Assessment Assessed: [Left: Yes] [Right: No] Edema: [Left: Ye] [Right: s] Calf Left: Right: Point of Measurement: 32 cm From Medial Instep 50.5 cm Ankle Left: Right: Point of Measurement: 10 cm From Medial Instep 23 cm Knee To Floor Left: Right: From Medial Instep 44 cm Vascular Assessment Pulses: Dorsalis Pedis Doppler Audible: [Left:Yes] Blood Pressure: Brachial: [Left:140] Ankle: [Left:Dorsalis Pedis: 122 0.87] Electronic Signature(s) Signed: 04/12/2021 2:11:15 PM By: Donnamarie Poag Entered By: Donnamarie Poag on 04/12/2021 13:37:51 Bogus, Takeira Lenna Sciara ([id number]) -------------------------------------------------------------------------------- Multi Wound Chart Details Patient Name: [name], [name]. Date of Service: 04/12/2021 12:45 PM Medical Record Number: [id number] Patient Account Number: [id number] Date of Birth/Sex: Dec 25, 1945 (75 y.o. F) Treating RN:  Donnamarie Poag Primary Care Linford Quintela: [name] Other Clinician: Referring Agripina Guyette: [name] Treating Terea [name]/Extender: Tito Dine in Treatment: 0 Vital Signs Height(in): 63 Pulse(bpm): 47 Weight(lbs): 212 Blood Pressure(mmHg): 161/72 Body Mass Index(BMI): 38 Temperature(F): 98.3 Respiratory Rate(breaths/min): 18 Photos: [N/A:N/A] Wound Location: Left, Distal, Posterior Lower Leg N/A N/A Wounding Event: Gradually Appeared N/A N/A Primary Etiology: Venous Leg Ulcer N/A N/A Comorbid History: Cataracts, Asthma, Sleep Apnea, N/A N/A Deep Vein Thrombosis, Hypertension, Peripheral Venous Disease, Osteoarthritis, Received Chemotherapy, Received Radiation Date Acquired: 02/24/2021 N/A N/A Weeks of Treatment: 0 N/A N/A Wound Status: Open N/A N/A Measurements L x W x D (cm) 3x2.5x0.1 N/A N/A Area (cm) : 5.89 N/A N/A Volume (cm) : 0.589 N/A N/A Classification: Full Thickness Without Exposed N/A N/A Support Structures Exudate Amount: Medium N/A N/A Exudate Type: Serosanguineous N/A N/A Exudate Color: red, brown N/A N/A Granulation Amount: Medium (34-66%) N/A N/A Granulation  Quality: Red, Pink N/A N/A Necrotic Amount: Medium (34-66%) N/A N/A Exposed Structures: Fat Layer (Subcutaneous Tissue): N/A N/A Yes Fascia: No Tendon: No Muscle: No Joint: No Bone: No Procedures Performed: Compression Therapy N/A N/A Treatment Notes Electronic Signature(s) Signed: 04/12/2021 4:14:58 PM By: Linton Ham MD Previous Signature: 04/12/2021 2:11:15 PM Version By: Donnamarie Poag Entered By: Linton Ham on 04/12/2021 14:21:45 Pogorzelski, [name] ([id number]) -------------------------------------------------------------------------------- Multi-Disciplinary Care Plan Details Patient Name: TAYGAN, CONNELL. Date of Service: 04/12/2021 12:45 PM Medical Record Number: [id number] Patient Account Number: [id number] Date of Birth/Sex: 05/31/1946 (75 y.o. F) Treating RN:  Donnamarie Poag Primary Care Dejane Scheibe: [name] Other Clinician: Referring Jasten Guyette: [name] Treating Hikari Tripp/Extender: Tito Dine in Treatment: 0 Active Inactive Necrotic Tissue Nursing Diagnoses: Impaired tissue integrity related to necrotic/devitalized tissue Knowledge deficit related to management of necrotic/devitalized tissue Goals: Necrotic/devitalized tissue will be minimized in the wound bed Date Initiated: 04/12/2021 Target Resolution Date: 04/27/2021 Goal Status: Active Interventions: Assess patient pain level pre-, during and post procedure and prior to discharge Provide education on necrotic tissue and debridement process Treatment Activities: Apply topical anesthetic as ordered : 04/12/2021 Notes: Orientation to the Wound Care Program Nursing Diagnoses: Knowledge deficit related to the wound healing center program Goals: Patient/caregiver will verbalize understanding of the Price Date Initiated: 04/12/2021 Target Resolution Date: 05/04/2021 Goal Status: Active Interventions: Provide education on orientation to the wound center Notes: Venous Leg Ulcer Nursing Diagnoses: Actual venous Insuffiency (use after diagnosis is confirmed) Knowledge deficit related to disease process and management Potential for venous Insuffiency (use before diagnosis confirmed) Goals: Patient will maintain optimal edema control Date Initiated: 04/12/2021 Target Resolution Date: 05/04/2021 Goal Status: Active Interventions: Assess peripheral edema status every visit. Compression as ordered Provide education on venous insufficiency Treatment Activities: Therapeutic compression applied : 04/12/2021 RHYA, SHAN ([id number]) Notes: Wound/Skin Impairment Nursing Diagnoses: Impaired tissue integrity Goals: Patient/caregiver will verbalize understanding of skin care regimen Date Initiated: 04/12/2021 Target Resolution Date: 05/04/2021 Goal  Status: Active Ulcer/skin breakdown will have a volume reduction of 30% by week 4 Date Initiated: 04/12/2021 Target Resolution Date: 05/12/2021 Goal Status: Active Interventions: Assess ulceration(s) every visit Treatment Activities: Referred to DME Khambrel Amsden for dressing supplies : 04/12/2021 Skin care regimen initiated : 04/12/2021 Notes: Electronic Signature(s) Signed: 04/12/2021 2:11:15 PM By: Donnamarie Poag Entered By: Donnamarie Poag on 04/12/2021 14:04:20 Schnackenberg, [name] ([id number]) -------------------------------------------------------------------------------- Pain Assessment Details Patient Name: GAILYN, CROOK. Date of Service: 04/12/2021 12:45 PM Medical Record Number: [id number] Patient Account Number: [id number] Date of Birth/Sex: [name] 11, 1946 (75 y.o. F) Treating RN: Donnamarie Poag Primary Care Ethanael Veith: [name] Other Clinician: Referring Ximenna Fonseca: [name] Treating Darienne Belleau/Extender: Tito Dine in Treatment: 0 Active Problems Location of Pain Severity and Description of Pain Patient Has Paino Yes Site Locations Pain Location: Pain in Ulcers Rate the pain. Current Pain Level: 5 Pain Management and Medication Current Pain Management: Electronic Signature(s) Signed: 04/12/2021 2:11:15 PM By: Donnamarie Poag Entered By: Donnamarie Poag on 04/12/2021 13:16:28 Jurgensen, [name] ([id number]) -------------------------------------------------------------------------------- Patient/Caregiver Education Details Patient Name: ALISYN, LEQUIRE. Date of Service: 04/12/2021 12:45 PM Medical Record Number: [id number] Patient Account Number: [id number] Date of Birth/Gender: 1946/06/11 (75 y.o. F) Treating RN: Cornell Barman Primary Care Physician: [name] Other Clinician: Referring Physician: [name] Treating Physician/Extender: Tito Dine in Treatment: 0 Education Assessment Education Provided To: Patient Education Topics  Provided Venous: Handouts: Controlling Swelling with Multilayered Compression Wraps Methods: Demonstration, Explain/Verbal Responses: State content correctly Welcome To  The [place]: Handouts: Welcome To [facility] [place] Methods: Demonstration, Explain/Verbal Responses: State content correctly Electronic Signature(s) Signed: 04/12/2021 4:36:28 PM By: [name], [name], RN, CWS, [name], [name] Entered By: [name], [name], RN, CWS, Kim on 04/12/2021 14:11:29 [name] ([id number]) -------------------------------------------------------------------------------- Wound Assessment Details Patient Name: LACHRISTA, HESLIN. Date of Service: 04/12/2021 12:45 PM Medical Record Number: [id number] Patient Account Number: [id number] Date of Birth/Sex: 06/12/1946 (75 y.o. F) Treating RN: Donnamarie Poag Primary Care Taijah Macrae: [name] Other Clinician: Referring Mairyn Lenahan: [name] Treating Aikam Hellickson/Extender: Tito Dine in Treatment: 0 Wound Status Wound Number: 13 Primary Venous Leg Ulcer Etiology: Wound Location: Left, Distal, Posterior Lower Leg Wound Open Wounding Event: Gradually Appeared Status: Date Acquired: 02/24/2021 Comorbid Cataracts, Asthma, Sleep Apnea, Deep Vein Thrombosis, Weeks Of Treatment: 0 History: Hypertension, Peripheral Venous Disease, Osteoarthritis, Clustered Wound: No Received Chemotherapy, Received Radiation Photos Wound Measurements Length: (cm) 3 Width: (cm) 2.5 Depth: (cm) 0.1 Area: (cm) 5.89 Volume: (cm) 0.589 % Reduction in Area: % Reduction in Volume: Tunneling: No Undermining: No Wound Description Classification: Full Thickness Without Exposed Support Structures Exudate Amount: Medium Exudate Type: Serosanguineous Exudate Color: red, brown Foul Odor After Cleansing: No Slough/Fibrino Yes Wound Bed Granulation Amount: Medium (34-66%) Exposed Structure Granulation Quality: Red, Pink Fascia Exposed: No Necrotic Amount:  Medium (34-66%) Fat Layer (Subcutaneous Tissue) Exposed: Yes Necrotic Quality: Adherent Slough Tendon Exposed: No Muscle Exposed: No Joint Exposed: No Bone Exposed: No Treatment Notes Wound #13 (Lower Leg) Wound Laterality: Left, Posterior, Distal Cleanser Soap and Water Discharge Instruction: Gently cleanse wound with antibacterial soap, rinse and pat dry prior to dressing wounds Peri-Wound Care IVIANA, BLASINGAME ([id number]) Topical Primary Dressing Iodosorb 40 (g) Discharge Instruction: Apply IodoSorb to wound bed only as directed. Secondary Dressing ABD Pad 5x9 (in/in) Discharge Instruction: Cover with ABD pad Secured With Compression Wrap Profore Lite LF 3 Multilayer Compression Bandaging System Discharge Instruction: Apply 3 multi-layer wrap as prescribed. Compression Stockings Add-Ons Electronic Signature(s) Signed: 04/12/2021 2:11:15 PM By: Donnamarie Poag Entered By: Donnamarie Poag on 04/12/2021 13:30:57 Civil, [name] ([id number]) -------------------------------------------------------------------------------- Vitals Details Patient Name: KAILENE, STEINHART. Date of Service: 04/12/2021 12:45 PM Medical Record Number: [id number] Patient Account Number: [id number] Date of Birth/Sex: 1946/10/13 (75 y.o. F) Treating RN: Donnamarie Poag Primary Care Shakil Dirk: [name] Other Clinician: Referring Shaniqwa Horsman: [name] Treating Alberto Pina/Extender: Tito Dine in Treatment: 0 Vital Signs Time Taken: 15:15 Temperature (F): 98.3 Height (in): 63 Pulse (bpm): 74 Source: Stated Respiratory Rate (breaths/min): 18 Weight (lbs): 212 Blood Pressure (mmHg): 161/72 Source: Measured Reference Range: 80 - 120 mg / dl Body Mass Index (BMI): 37.6 Electronic Signature(s) Signed: 04/12/2021 2:11:15 PM By: Donnamarie Poag Entered ByDonnamarie Poag on 04/12/2021 13:16:55

## 2021-04-13 ENCOUNTER — Ambulatory Visit (INDEPENDENT_AMBULATORY_CARE_PROVIDER_SITE_OTHER): Payer: Medicare Other | Admitting: Podiatry

## 2021-04-13 ENCOUNTER — Encounter: Payer: Self-pay | Admitting: Podiatry

## 2021-04-13 DIAGNOSIS — M79675 Pain in left toe(s): Secondary | ICD-10-CM | POA: Diagnosis not present

## 2021-04-13 DIAGNOSIS — B351 Tinea unguium: Secondary | ICD-10-CM | POA: Diagnosis not present

## 2021-04-13 DIAGNOSIS — M79674 Pain in right toe(s): Secondary | ICD-10-CM

## 2021-04-13 DIAGNOSIS — I872 Venous insufficiency (chronic) (peripheral): Secondary | ICD-10-CM

## 2021-04-13 DIAGNOSIS — G629 Polyneuropathy, unspecified: Secondary | ICD-10-CM

## 2021-04-13 NOTE — Progress Notes (Signed)
NORINA, COWPER ([id number]) Visit Report for 04/12/2021 HPI Details Patient Name: Amber, [name]. Date of Service: 04/12/2021 12:45 PM Medical Record Number: [id number] Patient Account Number: [id number] Date of Birth/Sex: 1946-03-10 (75 y.o. F) Treating RN: Cornell Barman Primary Care Provider: [name] Other Clinician: Referring Provider: [name] Treating Provider/Extender: Tito Dine in Treatment: 0 History of Present Illness HPI Description: Pleasant 75 year old with history of chronic venous insufficiency. No diabetes or peripheral vascular disease. Left ABI 1.29. Questionable history of left lower extremity DVT. She developed a recurrent ulceration on her left lateral calf in December 2015, which she attributes to poor diet and subsequent lower extremity edema. She underwent endovenous laser ablation of her left greater saphenous vein in 2010. She underwent laser ablation of accessory branch of left GSV in April 2016 by Dr. Kellie Simmering at [facility]. She was previously wearing Unna boots, which she tolerated well. Tolerating 2 layer compression and cadexomer iodine. She returns to clinic for follow-up and is without new complaints. She denies any significant pain at this time. She reports persistent pain with pressure. No claudication or ischemic rest pain. No fever or chills. No drainage. READMISSION 11/13/16; this is a 75 year old woman who is not a diabetic. She is here for a review of a painful area on her left medial lower extremity. I note that she was seen here previously last year for wound I believe to be in the same area. At that time she had undergone previously a left greater saphenous vein ablation by Dr. Kellie Simmering and she had a ablation of the anterior accessory branch of the left greater saphenous vein in March 2016. Seeing that the wound actually closed over. In reviewing the history with her today the ulcer in this area has been recurrent. She describes  a biopsy of this area in 2009 that only showed stasis physiology. 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 Do not see that she has ever had arterial studies in the left leg. She tells me she has a follow-up with Dr. Kellie Simmering in roughly 10 days In any case she developed the reopening of this area roughly a month ago. On the background of this she describes rapidly increasing edema which has responded to Lasix 40 mg and metolazone 2.5 mg as well as the patient's lymph massage. She has been told she has both venous insufficiency and lymphedema but she cannot tolerate compression stockings 11/28/16; the patient saw Dr. Kellie Simmering recently. Per the patient he did arterial Dopplers in the office that did not show evidence of arterial insufficiency, per the patient he stated "treat this like an ordinary venous ulcer". She also saw her dermatologist Dr. Ronnald Ramp who felt that this was more of a vascular ulcer. In general things are improving although she arrives today with increasing bilateral lower extremity edema with weeping a deeper fluid through the wound on the left medial leg compatible with some degree of lymphedema 12/04/16; the patient's wound is fully epithelialized but I don't think fully healed. We will do another week of depression with Promogran and TCA however I suspect we'll be able to discharge her next week. This is a very unusual-looking wound which was initially a figure-of-eight type wound lying on its side surrounded by petechial like hemorrhage. She  has had venous ablation on this side. She apparently does not have an arterial issue per Dr. Kellie Simmering. She saw her dermatologist thought it was "vascular". Patient is definitely going to need ongoing compression and I talked about  this with her today she will go to elastic therapy after she leaves here next week 12/11/16; the patient's wound is not completely closed today. She has surrounding scar tissue and in further discussion with the patient it would appear that she had ulcers in this area in 2009 for a prolonged period of time ultimately requiring a punch biopsy of this area that only showed venous insufficiency. I did not previously pickup on this part of the history from the patient. 12/18/16; the patient's wound is completely epithelialized. There is no open area here. She has significant bilateral venous insufficiency with secondary lymphedema to a mild-to-moderate degree she does not have compression stockings.. She did not say anything to me when I was in the room, she told our intake nurse that she was still having pain in this area. This isn't unusual recurrent small open area. She is going to go to elastic therapy to obtain compression stockings. 12/25/16; the patient's wound is fully epithelialized. There is no open area here. The patient describes some continued episodic discomfort in this area medial left calf. However everything looks fine and healed here. She is been to elastic therapy and caught herself 15-20 mmHg stockings, they apparently were having trouble getting 20-30 mm stockings in her size 01/22/17; this is a patient we discharged from the clinic a month ago. She has a recurrent open wound on her medial left calf. She had 15 mm support stockings. I told her I thought she needed 20-30 mm compression stockings. She tells me that she has been ill with hospitalization secondary to asthma and is been found to have severe hypokalemia likely secondary to a combination of Lasix and metolazone. This morning she noted blistering and leaking fluid on the posterior part of her left leg. She called our intake nurse urgently and we was saw her this afternoon. She has not had any real discomfort here. I don't know  that she's been wearing any stockings on this leg for at least 2-3 days. ABIs in this clinic were 1.21 on the right and 1.3 on the left. She is previously seen vascular surgery who does not think that there is a peripheral arterial issue. 01/30/17; Patient arrives with no open wound on the left leg. She has been to elastic therapy and obtained 20-14mmhg below knee stockings and she has one on the right leg today. READMISSION 02/19/18; this Cirrincione is a now 75 year old patient we've had in this clinic perhaps 3 times before. I had last looked at her from January 07 December 2016 with an area on the medial left leg. We discharged her on 12/25/16 however she had to be readmitted on 01/22/17 with a recurrence. I have in my notes that we discharged her on 20-30 mm stockings although she tells me she was only wearing support hose because she cannot get stockings on predominantly related to her cervical spine surgery/issues. She has had previous ablations done by vein and vascular in [place] including a great saphenous vein ablation on the left with an anterior accessory branch ablation I think both of these were in 2016. On one of the previous visit she had a biopsy noted 2009 that was negative. She is not felt to have an arterial issue. She is not a diabetic. She does  have a history of obstructive sleep apnea hypertension asthma as well as chronic venous insufficiency and lymphedema. On this occasion she noted 2 dry scaly patch on her left leg. She tried to put lotion on this it didn't really help. There were 2 open areas.the JAMERIA, BRADWAY ([id number]) patient has been seeing her primary physician from 02/05/18 through 02/14/18. She had Unna boots applied. The superior wound now on the lateral left leg has closed but she's had one wound that remains open on the lateral left leg. This is not the same spot as we dealt with in 2018. ABIs in this clinic were 1.3 bilaterally 02/26/18; patient has a small wound on  the left lateral calf. Dimensions are down. She has chronic venous insufficiency and lymphedema. 03/05/18; small open area on the left lateral calf. Dimensions are down. Tightly adherent necrotic debris over the surface of the wound which was difficult to remove. Also the dressing [over collagen] stuck to the wound surface. This was removed with some difficulty as well. Change the primary dressing to Hydrofera Blue ready 03/12/18; small open area on the left lateral calf. Comes in with tightly adherent surface eschar as well as some adherent Hydrofera Blue. 03/19/18; open area on the left lateral calf. Again adherent surface eschar as well as some adherent Hydrofera Blue nonviable subcutaneous tissue. She complained of pain all week even with the reduction from 4-3 layer compression I put on last week. Also she had an increase in her ankle and calf measurements probably related to the same thing. 03/26/18; open area on the left lateral calf. A very small open area remains here. We used silver alginate starting last week as the Hydrofera Blue seem to stick to the wound bed. In using 4-layer compression 04/02/18; the open area in the left lateral calf at some adherent slough which I removed there is no open area here. We are able to transition her into her own compression stocking. Truthfully I think this is probably his support hose. However this does not maintain skin integrity will be limited. She cannot put over the toe compression stockings on because of neck problems hand problems etc. She is allergic to the lining layer of juxta lites. We might be forced to use extremitease stocking should this fail READMIT 11/24/2018 Patient is now a 75 year old woman who is not a diabetic. She has been in this clinic on at least 3 previous occasions largely with recurrent wounds on her left leg secondary to chronic venous insufficiency with secondary lymphedema. Her situation is complicated by inability to  get stockings on and an allergy to neoprene which is apparently a component and at least juxta lites and other stockings. As a result she really has not been wearing any stockings on her legs. She tells [place] that roughly 2 or 3 weeks ago she started noticing a stinging sensation just above her ankle on the left medial aspect. She has been diagnosed with pseudogout and she wondered whether this was what she was experiencing. She tried to dress this with something she bought at the store however subsequently it pulled skin off and now she has an open wound that is not improving. She has been using Vaseline gauze with a cover bandage. She saw her primary doctor last week who put an Haematologist on her. ABIs in this clinic was 1.03 on the left 2/12; the area is on the left medial ankle. Odd-looking wound with what looks to be surface epithelialization but a multitude of  small petechial openings. This clearly not closed yet. We have been using silver alginate under 3 layer compression with TCA 2/19; the wound area did not look quite as good this week. Necrotic debris over the majority of the wound surface which required debridement. She continues to have a multitude of what looked to be small petechial openings. She reminds [place] that she had a biopsy on this initially during her first outbreak in 2015 in [place] [facility]. She expresses concern about this being a possible melanoma. 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 I assured her that this does not look anything like melanoma. Except for the petechial reaction it does look like a venous insufficiency area and she certainly has evidence of this on both sides 2/26; a difficult area on the left medial ankle. The patient clearly has chronic venous hypertension with some degree of lymphedema. The odd thing about the area is the small petechial hemorrhages. I am not  really sure how to explain this. This was present last time and this is not a compression injury. We have been using Hydrofera Blue which I changed to last week 3/4; still using Hydrofera Blue. Aggressive debridement today. She does not have known arterial issues. She has seen Dr. Kellie Simmering at [facility] vein and vascular and and has an ablation on the left. [Anterior accessory branch of the greater saphenous]. From what I remember they did not feel she had an arterial issue. The patient has had this area biopsied in 2009 at [facility] dermatology and by her recollection they said this was "stasis". She is also follow-up with dermatology locally who thought that this was more of a vascular issue 3/11; using Hydrofera Blue. Aggressive debridement today. She does not have an arterial issue. We are using 3 layer compression although we may need to go to 4. The patient has been in for multiple changes to her wrap since I last saw her a week ago. She says that the area was leaking. I do not have too much more information on what was found 01/19/19 on evaluation today patient was actually being seen for a nurse visit when unfortunately she had the area on her left lateral lower extremity as well as weeping from the right lower extremity that became apparent. Therefore we did end up actually seeing her for a full visit with myself. She is having some pain at this site as well but fortunately nothing too significant at this point. No fevers, chills, nausea, or vomiting noted at this time. 3/18-Patient is back to the clinic with the left leg venous leg ulcer, the ulcer is larger in size, has a surface that is densely adherent with fibrinous tissue, the Hydrofera Blue was used but is densely adherent and there was difficulty in removing it. The right lower extremity was also wrapped for weeping edema. Patient has a new area over the left lateral foot above the malleolus that is small and appears to have no debris  with intact surrounding skin. Patient is on increased dose of Lasix also as a means to edema management 3/25; the patient has a nonhealing venous ulcer on the medial left leg and last week developed a smaller area on the lateral left calf. We have been using Hydrofera Blue with a contact layer. 4/1; no major change in these wounds areas. Left medial and more recently left lateral calf. I tried Iodoflex last week to aid in debridement  she did not tolerate this. She stated her pain was terrible all week. She took the top layer of the 4 layer compression off. 4/8; the patient actually looks somewhat better in terms of her more prominent left lateral calf wound. There is some healthy looking tissue here. She is still complaining of a lot of discomfort. 4/15; patient in a lot of pain secondary to sciatica. She is on a prednisone taper prescribed by her primary physician. She has the 2 areas one on the left medial and more recently a smaller area on the left lateral calf. Both of these just above the malleoli 4/22; her back pain is better but she still states she is very uncomfortable and now feels she is intolerant to the [employer]. No real change in the wounds we have been using Sorbact. She has been previously intolerant to Iodoflex. There is not a lot of option about what we can use to debride this wound under compression that she no doubt needs. sHe states Ultram no longer works for her pain 4/29; no major change in the wounds slightly increased depth. Surface on the original medial wound perhaps somewhat improved however the more recent area on the lateral left ankle is 100% covered in very adherent debris we have been using Sorbact. She tolerates 4 layer compression well and her edema control is a lot better. She has not had to come in for a nurse check 5/6; no major change in the condition of the wounds. She did consent to debridement today which was done with some difficulty. Continuing Sorbact.  She did not tolerate Iodoflex. She was in for a check of her compression the day after we wrapped her last week this was adjusted but nothing much was found 5/13; no major change in the condition or area of the wounds. I was able to get a fairly aggressive debridement done on the lateral left leg wound. Even using Sorbact under compression. She came back in on Friday to have the wrap changed. She says she felt uncomfortable on the WAYLYNN, BENEFIEL. ([id number]) lateral aspect of her ankle. She has a long history of chronic venous insufficiency including previous ablation surgery on this side. 5/20-Patient returns for wounds on left leg with both wounds covered in slough, with the lateral leg wound larger in size, she has been in 3 layer compression and felt more comfortable, she describes pain in ankle, in leg and pins and needles in foot, and is about to try Pamelor for this 6/3; wounds on the left lateral and left medial leg. The area medially which is the most recent of the 2 seems to have had the largest increase in dimensions. We have been using Sorbac to try and debride the surface. She has been to see orthopedics they apparently did a plain x-ray that was indeterminant. Diagnosed her with neuropathy and they have ordered an MRI to determine if there is underlying osteomyelitis. This was not high on my thought list but I suppose it is prudent. We have advised her to make an appointment with vein and vascular in [place]. She has a history of a left greater saphenous and accessory vein ablations I wonder if there is anything else that can be done from a surgical point of view to help in these difficult refractory wounds. We have previously healed this wound on one occasion but it keeps on reopening [medial side] 6/10; deep tissue culture I did last week I think on the left medial wound showed  both moderate E. coli and moderate staph aureus [MSSA]. She is going to require antibiotics and I have  chosen Augmentin. We have been using Sorbact and we have made better looking wound surface on both sides but certainly no improvement in wound area. She was back in last Friday apparently for a dressing changes the wrap was hurting her outer left ankle. She has not managed to get a hold of vein and vascular in [place]. We are going to have to make her that appointment 6/17; patient is tolerating the Augmentin. She had an MRI that I think was ordered by orthopedic surgeon this did not show osteomyelitis or an abscess did suggest cellulitis. We have been using Sorbact to the lateral and medial ankles. We have been trying to arrange a follow-up appointment with vein and vascular in [place] or did her original ablations. We apparently an area sent the request to vein and vascular in [facility] 6/24; patient has completed the Augmentin. We do not yet have a vein and vascular appointment in [place]. I am not sure what the issue is here we have asked her to call tomorrow. We are using Sorbact. Making some improvements and especially the medial wound. Both surfaces however look better medial and lateral. 7/1; the patient has been in contact with vein and vascular in [place] but has not yet received an appointment. Using Sorbact we have gradually improve the wound surface with no improvement in surface area. She is approved for Apligraf but the wound surface still is not completely viable. She has not had to come in for a dressing change 7/8; the patient has an appointment with vein and vascular on 7/31 which is a Friday afternoon. She is concerned about getting back here for [place] to dress her wounds. I think it is important to have them goal for her venous reflux/history of ablations etc. to see if anything else can be done. She apparently tested positive for 1 of the blood tests with regards to lupus and saw a rheumatologist. He has raised the issue of vasculitis again. I have had this thought in  the past however the evidence seems overwhelming that this is a venous reflux etiology. If the rheumatologist tells me there is clinical and laboratory investigation is positive for lupus I will rethink this. 7/15; the patient's wound surfaces are quite a bit better. The medial area which was her original wound now has no depth although the lateral wound which was the more recent area actually appears larger. Both with viable surfaces which is indeed better. Using Sorbact. I wanted to use Apligraf on her however there is the issue of the vein and vascular appointment on 7/31 at 2:00 in the afternoon which would not allow her to get back to be rewrapped and they would no doubt remove the graft 7/22; the patient's wound surfaces have moderate amount of debris although generally look better. The lateral one is larger with 2 small satellite areas superiorly. We are waiting for her vein and vascular appointment on 7/31. She has been approved for Apligraf which I would like to use after th 7/29; wound surfaces have improved no debridement is required we have been using Sorbact. She sees vein and vascular on Friday with this so question of whether anything can be done to lessen the likelihood of recurrence and/or speed the healing of these areas. She is already had previous ablations. She no doubt has severe venous hypertension 8/5-Patient returns at 1 week, she was in [employer]  for 3 days by her podiatrist, we have been using so backed to the wound, she has increased pain in both the wounds on the left lower leg especially the more distal one on the lateral aspect 8/12-Patient returns at 1 week and she is agreeable to having debridement in both wounds on her left leg today. We have been using Sorbact, and vascular studies were reviewed at last visit 8/19; the patient arrives with her wounds fairly clean and no debridement is required. We have used Sorbact which is really done a nice job in cleaning up  these very difficult wound surfaces. The patient saw Dr. Donzetta Matters of vascular surgery on 7/31. He did not feel that there was an arterial component. He felt that her treated greater saphenous vein is adequately addressed and that the small saphenous vein did not appear to be involved significantly. She was also noted to have deep venous reflux which is not treatable. Dr. Donzetta Matters mentioned the possibility of a central obstructive component leading to reflux and he offered her central venography. She wanted to discuss this or think about it. I have urged her to go ahead with this. She has had recurrent difficult wounds in these areas which do heal but after months in the clinic. If there is anything that can be done to reduce the likelihood of this I think it is worth it. 9/2 she is still working towards getting follow-up with Dr. Donzetta Matters to schedule her CT. Things are quite a bit worse venography. I put Apligraf on 2 weeks ago on both wounds on the medial and lateral part of her left lower leg. She arrives in clinic today with 3 superficial additional wounds above the area laterally and one below the wound medially. She describes a lot of discomfort. I think these are probably wrapped injuries. Does not look like she has cellulitis. 07/20/2019 on evaluation today patient appears to be doing somewhat poorly in regard to her lower extremity ulcers. She in fact showed signs of erythema in fact we may even be dealing with an infection at this time. Unfortunately I am unsure if this is just infection or if indeed there may be some allergic reaction that occurred as a result of the Apligraf application. With that being said that would be unusual but nonetheless not impossible in this patient is one who is unfortunately allergic to quite a bit. Currently we have been using the Sorbact which seems to do as well as anything for her. I do think we may want to obtain a culture today to see if there is anything showing up  there that may need to be addressed. 9/16; noted that last week the wounds look worse in 1 week follow-up of the Apligraf. Using Sorbact as of 2 days ago. She arrives with copious amounts of drainage and new skin breakdown on the back of the left calf. The wounds arm more substantial bilaterally. There is a fair amount of swelling in the left calf no overt DVT there is edema present I think in the left greater than right thigh. She is supposed to go on 9/28 for CT venography. The wounds on the medial and lateral calf are worse and she has new skin breakdown posteriorly at least new for me. This is almost developing into a circumferential wound area The Apligraf was taken off last week which I agree with things are not going in the right direction a culture was done we do not have that back yet. She is  on Augmentin that she started 2 days ago 9/23; dressing was changed by her nurses on Monday. In general there is no improvement in the wound areas although the area looks less angry than last week. She did get Augmentin for MSSA cultured on the 14th. She still appears to have too much swelling in the left leg even with 3 layer compression 9/30; the patient underwent her procedure on 9/28 by Dr. Donzetta Matters at [facility]. She was discovered to have the common iliac vein measuring 12.2 mm but at the level of L4-L5 measured 3 mm. After stenting it measured 10 mm. 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 Rouleaux flow in the common femoral and femoral vein was observed much improved after stenting. We are using silver alginate to the wounds on the medial and lateral ankle on the left. 4 layer compression 10/7; the patient had fluid swelling around her knee and 4 layer compression. At the advice of vein and vascular this was reduced to 3 layer which she is tolerating better. We have been using silver alginate under 3 layer compression since last Friday 10/14; arrives with the  areas on the left ankle looking a lot better. Inflammation in the area also a lot better. She came in for a nurse check on SURA, CANUL ([id number]) 10/9 10/21; continued nice improvement. Slight improvements in surface area of both the medial and lateral wounds on the left. A lot of the satellite lesions in the weeping erythema around these from stasis dermatitis is resolved. We have been using silver alginate 10/28; general improvement in the entire wound areas although not a lot of change in dimensions the wound certainly looks better. There is a lot less in terms of venous inflammation. Continue silver alginate this week however look towards Hydrofera Blue next week 11/4; very adherent debris on the medial wound left wound is not as bad. We have been using silver alginate. Change to [facility] today 11/11; very adherent debris on both wound areas. She went to vein and vascular last week and follow-up they put in [place] boot on this today. He says the [facility] was adherent. Wound is definitely not as good as last week. Especially on the left there the satellite lesions look more prominent 11/18; absolutely no better. erythema on lateral aspect with tenderness. 09/30/2019 on evaluation today patient appears to actually be doing better. Dr. Dellia Nims did put her on doxycycline last week which I do believe has helped her at this point. Fortunately there is no signs of active infection at this time. No fevers, chills, nausea, vomiting, or diarrhea. I do believe he may want extend the doxycycline for 7 additional days just to ensure everything does completely cleared up the patient is in agreement with that plan. Otherwise she is going require some sharp debridement today 12/2; patient is completing a 2-week course of doxycycline. I gave her this empirically for inflammation as well as infection when I last saw her 2 weeks ago. All of this seems to be better. She is using silver alginate she  has the area on the medial aspect of the larger area laterally and the 2 small satellite regions laterally above the major wound. 12/9; the patient's wound on the left medial and left lateral calf look really quite good. We have been using silver alginate. She saw vein and vascular in follow-up on 10/09/2019. She has had a previous left greater saphenous vein ablation by Dr. Oscar La in 2016.  More recently she underwent a left common iliac vein stent by Dr. 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 The swelling is improved and certainly the wounds have improved. The patient shows [place] today area on the right medial calf there is almost no wound but leaking lymphedema. She says she start this started 3 or 4 days ago. She did not traumatize it. It is not painful. She does not wear compression on that side 12/16; the patient continues to do well laterally. Medially still requiring debridement. The area on the right calf did not materialize to anything and is not currently open. We wrapped this last time. She has support stockings for that leg although I am not sure they are going to provide adequate compression 12/23; the lateral wound looks stable. Medially still requiring debridement for tightly adherent fibrinous debris. We've been using silver alginate. Surface area not any different 12/30; neither wound is any better with regards to surface and the area on the left lateral is larger. I been using silver alginate to the left lateral which look quite good last week and Sorbact to the left medial 11/11/2019. Lateral wound area actually looks better and somewhat smaller. Medial still requires a very aggressive debridement today. We have been using Sorbact on both wound areas 1/13; not much better still adherent debris bilaterally. I been using Sorbact. She has severe venous hypertension. Probably some degree of dermal fibrosis distally. I wonder whether tighter compression might help and I am going  to try that today. We also need to work on the bioburden 1/20; using Sorbact. She has severe venous hypertension status post stent placement for pelvic vein compression. We applied gentamicin last time to see if we could reduce bioburden I had some discussion with her today about the use of pentoxifylline. This is occasionally used in this setting for wounds with refractory venous insufficiency. However this interacts with Plavix. She tells me that she was put on this after stent placement for 3 months. She will call Dr. Claretha Cooper office to discuss 1/27; we are using gentamicin under Sorbact. 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 She has a stent. Wound medially is measuring smaller this week. Laterally measuring slightly larger although she has some satellite lesions superiorly 2/3; gentamicin under Sorbact under 4-layer compression. 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 She has a stent on Plavix. Her wounds are measuring smaller this week. More substantially laterally where there is a satellite lesion superiorly. 2/10; gentamicin under Sorbac. 4-layer compression. Patient communicated with Dr. Donzetta Matters at vein and vascular in [place]. He is okay with the patient coming off Plavix I will therefore start her on pentoxifylline for a 1 month trial. In general her wounds look better today. I had some concerns about swelling in the left thigh however she measures 61.5 on the right and 63 on the mid thigh which does not suggest there is any difficulty. The patient is not describing any pain. 2/17; gentamicin under Sorbac 4-layer compression. She has been on pentoxifylline for 1 week and complains of loose stool. No nausea she is eating and drinking well 2/24; the patient apparently came in 2 days ago for a nurse visit when her wrap fell down. Both areas look a little worse this week macerated medially and satellite lesions laterally. Change to  silver alginate today 3/3; wounds are larger today especially medially. She also has more swelling in her foot lower leg and I even noted some swelling in  her posterior thigh which is tender. I wonder about the patency of her stent. Fortuitously she sees Dr. Claretha Cooper group on Friday 3/10; Mrs. Mastrogiovanni was seen by vein and vascular on 3/5. The patient underwent ultrasound. There was no evidence of thrombosis involving the IVC no evidence of thrombosis involving the right common iliac vein there is no evidence of thrombosis involving the right external iliac vein the left external vein is also patent. The right common iliac vein stent appears patent bilateral common femoral veins are compressible and appear patent. I was concerned about the left common iliac stent however it looks like this is functional. She has some edema in the posterior thigh that was tender she still has that this week. I also note they had trouble finding the pulses in her left foot and booked her for an ABI baseline in 4 weeks. She will follow up in 6 months for repeat IVC duplex. The patient stopped the pentoxifylline because of diarrhea. It does not look like that was being effective in any case. I have advised her to go back on her aspirin 81 mg tablet, vascular it also suggested this 3/17; comes in today with her wound surfaces a lot better. The excoriations from last week considerably better probably secondary to the TCA. We have been using silver alginate 3/24; comes in today with smaller wounds both medially and laterally. Both required debridement. There are 2 small satellite areas superiorly laterally. She also has a very odd bandlike area in the mid calf almost looking like there was a weakness in the wrap in a localized area. I would write this off as being this however anteriorly she has a small raised ballotable area that is very tender almost reminiscent of an abscess but there was no obvious purulent surface to  it. 02/04/20 upon evaluation today patient appears to be doing fairly well in regard to her wounds today. Fortunately there is no signs of active DEONE, OMAHONEY. ([id number]) infection at this time. No fevers, chills, nausea, vomiting, or diarrhea. She has been tolerating the dressing changes without complication. Fortunately I feel like she is showing signs of improvement although has been sometime since have seen her. Nonetheless the area of concern that Dr. Dellia Nims had last week where she had possibly an area of the wrap that was we can allow the leg to bulge appears to be doing significantly better today there is no signs of anything worsening. 4/7; the patient's wounds on her medial and lateral left leg continue to contract. We have been using a regular alginate. Last week she developed an area on the right medial lower leg which is probably a venous ulcer as well. 4/14; the wounds on her left medial and lateral lower leg continue to contract. Surface eschar. We have been using regular alginate. The area on the right medial lower leg is closed. We have been putting both legs under 4-layer contraction. The patient went back to see vein and vascular she had arterial studies done which were apparently "quite good" per the patient although I have not read their notes I have never felt she had an arterial issue. 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 This is been longstanding and refractory to exercise, leg elevation and longstanding use of compression wraps in our clinic as well as compression stockings on the times we have been able to get these to heal 4/21; we thought she actually might be close this week however she arrives in  clinic with a lot of edema in her upper left calf and into her posterior thigh. This is been an intermittent problem here. She says the wrap fell down but it was replaced with a nurse visit on Monday. We are using calcium  alginate to the wounds and the wound sizes there not terribly larger than last week but there is a lot more edema 4/28; again wound edges are smaller on both sides. Her edema is better controlled than last time. She is obtained her compression pumps from [employer] although they have not been to her home to set these up. 5/5; left medial and left lateral both look stable. I am not sure the medial is any smaller. We have been using calcium alginate under 4-layer compression. oShe had an area on the right medial. This was eschared today. We have been wrapping this as well. She does not tolerate external compression stockings due to a history of various contact allergies. She has her compression pumps however the representative from the company is coming on her to show her how to use these tomorrow 5/19; patient with severe chronic venous insufficiency secondary to central venous disease. She had a stent placed in her left common iliac vein. She has done better since but still difficult to control wounds. She comes in today with nothing open on the right leg. Her areas on the left medial and left lateral are just about closed. We are using calcium alginate under 4-layer compression. She is using her external compression pumps at home She only has 15-20 support stockings. States she cannot get anything tighter than that on. 03/30/20-Patient returns at 1 week, the wounds on the left leg are both slightly bigger, the last week she was on 3 layer compression which started to slide down. She is starting to use her lymphedema pumps although she stated on 1 day her right ankle started to swell up and she have to stop that day. Unfortunately the open area seem to oscillate between improving to the point of healing and then flaring up all to do with effectiveness of compression or lack of due to the left leg topography not keeping the compression wraps from rolling down 6/2 patient comes in with a 15/20  mmHg stocking on the right leg. She tells me that she developed a lot of swelling in her ankles she saw orthopedics she was felt to possibly be having a flare of pseudogout versus some other type of arthritis. She was put on steroids for a respiratory issue so that helps with the inflammation. She has not been using the pumps all week. She thinks the left thigh is more swollen than usual and I would agree with that. She has an appointment with Dr. Donzetta Matters 9 days or so from now 6/9; both wounds on the left medial and left lateral are smaller. We have been using calcium alginate under compression. She does not have an open wound on the right leg she is using a stocking and her compression pumps things are going well. She has an appointment with Dr. Donzetta Matters with regards to her stent in the left common iliac vein 6/16; the wounds on the left medial and left lateral ankle continues to contract. The patient saw Dr. Donzetta Matters and I think he seems satisfied. Ordered follow-up venous reflux studies on both sides in September. Cautioned that she may need thigh-high stockings. She has been using calcium alginate under compression on the left and her own stocking on the right leg.  She tells [place] there are no open wounds on the right 6/23; left lateral is just about closed. Medial required debridement today. We have been using calcium alginate. Extensive discussion about the compression pumps she is only using these on 25 mmHg states she could not take 40 or 30 when the wrap came out to her home to demonstrate these. He said they should not feel tight 6/30; the left lateral wound has a slight amount of eschar. . The area medially is about the same using Hydrofera Blue. 7/7; left lateral wound still has some eschar. I will remove this next week may be closed. The area medially is very small using Hydrofera Blue with improvement. Unfortunately the stockings fell down. Unfortunately the blisters have developed at the edge of  where the wrap fell. When this happened she says her legs hurt she did not use her pumps. We are not open Monday for her to come in and change the wraps and she had an appointment yesterday. She also tells me that she is going to have an MRI of her back. She is having pain radiating into her left anterior leg she thinks her from an L5 disc. She saw Dr. Ellene Route of neurosurgery 7/14; the area on the left lateral ankle area is closed. Still a small area medially however it looks better as well. We have been using Hydrofera Blue under 4-layer compression 7/21; left lateral ankle is still closed however her wound on the medial left calf is actually larger. This is probably because Hydrofera Blue got stuck to the wound. She came in for a nurse change on Friday and will do that again this week I was concerned about the amount of swelling that she had last week however she is using her compression pumps twice a day and the swelling seems well controlled 7/28; remaining wound on the left medial lower leg is smaller. We have been using moistened silver collagen under compression she is coming back for a nurse visit. For reasons that were not really clear she was just keeping her legs elevated and not using her compression pumps. I have asked her to use the compression pumps. She does not have any wounds on the right leg 06/15/20-Patient returns at 2 weeks, her LLE edema is worse and she developed a blister wound that is new and has bigger posterior calf wound on right, we are using Prisma with pad, 4 layer compression. she has been on lasix 40 mg daily 8/18; patient arrives today with things a lot worse than I remember from a few weeks ago. She was seen last week. Noted that her edema was worse and that she now had a left lateral wound as well as deteriorating edema in the medial and posterior part of the lower leg. She says she is using his or her external compression pumps once a day although I wonder about  the compliance. 8/25; weeping area on the right medial lower leg. This had actually gotten a small localized area of her compression stocking wet. oOn the left side there is a large denuded area on the posterior medial lower leg and smaller area on the lateral. This was not the original areas that we dealt with. 9/1 the patient's wound on the left leg include the left lateral and left posterior. Larger superficial wounds weeping. She has very poor edema control. Tender localized edema in the left lower medial ankle/heel probably because of localized wrap issues. She freely admits she is not using the  compression pumps. She has been up on her feet a lot. She thinks the hydrofera blue is contributing to the pain she is experiencing.. This is a complaint that I have occasionally heard 9/8; really not much improvement. The patient is still complaining of a lot of pain particularly when she uses compression pumps. I switched her JANAYIA, BURGGRAF ([id number]) to silver alginate last time because she found the Hydrofera Blue to be irritating. I don't hear much difference in her description with the silver alginate. 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 She has follow-up with Dr. Donzetta Matters I think for ultrasound next month 9/15; quite a bit of improvement today. We have less edema and more epithelialization in both of her wound areas on the left medial and left lateral calf. These are not the site of her original wounds in this area. She says she has been using her compression pumps for 30 minutes twice a day, there pain issues that never quite understood. Silver alginate as the primary dressing 9/22; continued improvement. Both areas medially and laterally still have a small open area there is some eschar. She continues to complain of left medial ankle pain. Swelling in the leg is in much better condition. We have been using  silver alginate 9/29; continued improvement. Both areas medially and laterally in the left calf look as though they are close some minor surface eschar but I think this is epithelialized. She comes in today saying she has a ruptured disc at L4-L5 cannot bend over to put on her stockings. 10/6; patient comes in today with no open wounds on either leg. However her edema on the left leg in the upper one third of the lower leg is poorly controlled nonpitting. She says that she could not use the pumps for 2 days and then she has been using the last couple of days. It is not clear to me she has been able to get her stocking on. She has back problems. Mrs. Gagan has severe chronic venous insufficiency with secondary lymphedema. Her venous insufficiency is partially centrally mediated and that she is now post stent in the left common iliac vein. [facility] Thurner's syndrome/physiology]. She follows up with them on 10/15. She wears 20/30 below-knee stockings. She is supposed to use compression pumps at home although I think her compliance about with this is been less than 100%. I have asked her to use these 3 times a day. Finally I think she has lipodermatosclerosis in the left lower leg with an inverted bottle sign. It is been a major problem controlling the edema in the left leg. The right leg we have had wounds on but not as significant a problem is on the left READMISSION 04/12/2021 Mrs. Fenner is a 75 year old woman we know well in this clinic. She has severe chronic venous insufficiency. 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 I believe she has had bilateral greater saphenous vein stripping in the past as well. She tells me that this wound opened sometime in March. She had a fall and thinks it was initially abrasion. She developed areas she describes as little blisters on the anterior part of her leg and she saw dermatology and was treated for methicillin staph  aureus with several rounds of antibiotics. She has been using support stockings on the left leg and says this is the only thing  she can get on. Her compression pump use maybe once a day she says if she did not use one she use the other. She comes in today with incredible swelling in the left leg with a wound on the left posterior calf. She has been using Neosporin to this previously a hydrocolloid. Electronic Signature(s) Signed: 04/12/2021 4:14:58 PM By: Linton Ham MD Entered By: Linton Ham on 04/12/2021 14:26:03 [name] ([id number]) -------------------------------------------------------------------------------- Physical Exam Details Patient Name: ICESS, BERTONI. Date of Service: 04/12/2021 12:45 PM Medical Record Number: [id number] Patient Account Number: [id number] Date of Birth/Sex: Mar 13, 1946 (75 y.o. F) Treating RN: Cornell Barman Primary Care Provider: [name] Other Clinician: Referring Provider: [name] Treating Provider/Extender: Tito Dine in Treatment: 0 Constitutional Sitting or standing Blood Pressure is within target range for patient.. Pulse regular and within target range for patient.Marland Kitchen Respirations regular, non- labored and within target range.. Temperature is normal and within the target range for the patient.Marland Kitchen appears in no distress. Cardiovascular Pedal pulses are palpable in the left foot.. Large amount of swelling in the left leg which I think is all lymphedema. I do not see any evidence of cellulitis no palpable tenderness.. Notes Wound exam; the area posterior on the left calf does not have a completely viable surface although I did not debride this in view of the amount of swelling in the left leg but I think this is probably going to need to be debrided going forward. There is no evidence I can see of cellulitis. No blisters or pustules that she was describing. Electronic Signature(s) Signed: 04/12/2021 4:14:58 PM By:  Linton Ham MD Entered By: Linton Ham on 04/12/2021 14:30:39 Gamel, [name] ([id number]) -------------------------------------------------------------------------------- Physician Orders Details Patient Name: CARESSA, SCEARCE. Date of Service: 04/12/2021 12:45 PM Medical Record Number: [id number] Patient Account Number: [id number] Date of Birth/Sex: 20-Feb-1946 (75 y.o. F) Treating RN: Donnamarie Poag Primary Care Provider: [name] Other Clinician: Referring Provider: [name] Treating Provider/Extender: Tito Dine in Treatment: 0 Verbal / Phone Orders: No Diagnosis Coding Follow-up Appointments Wound #13 Left,Distal,Posterior Lower Leg o Return Appointment in 1 week. o Nurse Visit as needed Bathing/ Shower/ Hygiene o May shower with wound dressing protected with water repellent cover or cast protector. Anesthetic (Use 'Patient Medications' Section for Anesthetic Order Entry) o Lidocaine applied to wound bed Edema Control - Lymphedema / Segmental Compressive Device / Other o Elevate, Exercise Daily and Avoid Standing for Long Periods of Time. o Elevate legs to the level of the heart and pump ankles as often as possible o Elevate leg(s) parallel to the floor when sitting. o Compression Pump: Use compression pump on left lower extremity for 60 minutes, twice daily. Wound Treatment Wound #13 - Lower Leg Wound Laterality: Left, Posterior, Distal Cleanser: Soap and Water Discharge Instructions: Gently cleanse wound with antibacterial soap, rinse and pat dry prior to dressing wounds Primary Dressing: Iodosorb 40 (g) Discharge Instructions: Apply IodoSorb to wound bed only as directed. Secondary Dressing: ABD Pad 5x9 (in/in) Discharge Instructions: Cover with ABD pad Compression Wrap: Profore Lite LF 3 Multilayer Compression Bandaging System Discharge Instructions: Apply 3 multi-layer wrap as prescribed. Electronic Signature(s) Signed:  04/12/2021 4:14:58 PM By: Linton Ham MD Signed: 04/12/2021 4:36:28 PM By: [name], [name], RN, CWS, [name], [name] Entered By: [name], [name], RN, CWS, Kim on 04/12/2021 14:10:11 ONDA, KATTNER ([id number]) -------------------------------------------------------------------------------- Problem List Details Patient Name: TESSIA, KASSIN. Date of Service: 04/12/2021 12:45 PM Medical Record Number: [id number] Patient Account  Number: [id number] Date of Birth/Sex: 11/11/1945 (75 y.o. F) Treating RN: Cornell Barman Primary Care Provider: [name] Other Clinician: Referring Provider: [name] Treating Provider/Extender: Tito Dine in Treatment: 0 Active Problems ICD-10 Encounter Code Description Active Date MDM Diagnosis I87.332 Chronic venous hypertension (idiopathic) with ulcer and inflammation of 04/12/2021 No Yes left lower extremity I89.0 Lymphedema, not elsewhere classified 04/12/2021 No Yes L97.221 Non-pressure chronic ulcer of left calf limited to breakdown of skin 04/12/2021 No Yes Inactive Problems Resolved Problems Electronic Signature(s) Signed: 04/12/2021 4:14:58 PM By: Linton Ham MD Entered By: Linton Ham on 04/12/2021 14:12:55 Wetherbee, [name] ([id number]) -------------------------------------------------------------------------------- Progress Note/History and Physical Details Patient Name: TOMARA, YOUNGBERG. Date of Service: 04/12/2021 12:45 PM Medical Record Number: [id number] Patient Account Number: [id number] Date of Birth/Sex: 16-May-1946 (75 y.o. F) Treating RN: Cornell Barman Primary Care Provider: [name] Other Clinician: Referring Provider: [name] Treating Provider/Extender: Tito Dine in Treatment: 0 Subjective History of Present Illness (HPI) Pleasant 75 year old with history of chronic venous insufficiency. No diabetes or peripheral vascular disease. Left ABI 1.29. Questionable history of left lower extremity  DVT. She developed a recurrent ulceration on her left lateral calf in December 2015, which she attributes to poor diet and subsequent lower extremity edema. She underwent endovenous laser ablation of her left greater saphenous vein in 2010. She underwent laser ablation of accessory branch of left GSV in April 2016 by Dr. Kellie Simmering at [facility]. She was previously wearing Unna boots, which she tolerated well. Tolerating 2 layer compression and cadexomer iodine. She returns to clinic for follow-up and is without new complaints. She denies any significant pain at this time. She reports persistent pain with pressure. No claudication or ischemic rest pain. No fever or chills. No drainage. READMISSION 11/13/16; this is a 75 year old woman who is not a diabetic. She is here for a review of a painful area on her left medial lower extremity. I note that she was seen here previously last year for wound I believe to be in the same area. At that time she had undergone previously a left greater saphenous vein ablation by Dr. Kellie Simmering and she had a ablation of the anterior accessory branch of the left greater saphenous vein in March 2016. Seeing that the wound actually closed over. In reviewing the history with her today the ulcer in this area has been recurrent. She describes a biopsy of this area in 2009 that only showed stasis physiology. 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 Do not see that she has ever had arterial studies in the left leg. She tells me she has a follow-up with Dr. Kellie Simmering in roughly 10 days In any case she developed the reopening of this area roughly a month ago. On the background of this she describes rapidly increasing edema which has responded to Lasix 40 mg and metolazone  2.5 mg as well as the patient's lymph massage. She has been told she has both venous insufficiency and lymphedema but she cannot tolerate compression stockings 11/28/16; the patient saw Dr. Kellie Simmering recently. Per the patient he did arterial Dopplers in the office that did not show evidence of arterial insufficiency, per the patient he stated "treat this like an ordinary venous ulcer". She also saw her dermatologist  Dr. Ronnald Ramp who felt that this was more of a vascular ulcer. In general things are improving although she arrives today with increasing bilateral lower extremity edema with weeping a deeper fluid through the wound on the left medial leg compatible with some degree of lymphedema 12/04/16; the patient's wound is fully epithelialized but I don't think fully healed. We will do another week of depression with Promogran and TCA however I suspect we'll be able to discharge her next week. This is a very unusual-looking wound which was initially a figure-of-eight type wound lying on its side surrounded by petechial like hemorrhage. She has had venous ablation on this side. She apparently does not have an arterial issue per Dr. Kellie Simmering. She saw her dermatologist thought it was "vascular". Patient is definitely going to need ongoing compression and I talked about this with her today she will go to elastic therapy after she leaves here next week 12/11/16; the patient's wound is not completely closed today. She has surrounding scar tissue and in further discussion with the patient it would appear that she had ulcers in this area in 2009 for a prolonged period of time ultimately requiring a punch biopsy of this area that only showed venous insufficiency. I did not previously pickup on this part of the history from the patient. 12/18/16; the patient's wound is completely epithelialized. There is no open area here. She has significant bilateral venous insufficiency with secondary lymphedema to a mild-to-moderate  degree she does not have compression stockings.. She did not say anything to me when I was in the room, she told our intake nurse that she was still having pain in this area. This isn't unusual recurrent small open area. She is going to go to elastic therapy to obtain compression stockings. 12/25/16; the patient's wound is fully epithelialized. There is no open area here. The patient describes some continued episodic discomfort in this area medial left calf. However everything looks fine and healed here. She is been to elastic therapy and caught herself 15-20 mmHg stockings, they apparently were having trouble getting 20-30 mm stockings in her size 01/22/17; this is a patient we discharged from the clinic a month ago. She has a recurrent open wound on her medial left calf. She had 15 mm support stockings. I told her I thought she needed 20-30 mm compression stockings. She tells me that she has been ill with hospitalization secondary to asthma and is been found to have severe hypokalemia likely secondary to a combination of Lasix and metolazone. This morning she noted blistering and leaking fluid on the posterior part of her left leg. She called our intake nurse urgently and we was saw her this afternoon. She has not had any real discomfort here. I don't know that she's been wearing any stockings on this leg for at least 2-3 days. ABIs in this clinic were 1.21 on the right and 1.3 on the left. She is previously seen vascular surgery who does not think that there is a peripheral arterial issue. 01/30/17; Patient arrives with no open wound on the left leg. She has been to elastic therapy and obtained 20-5mmhg below knee stockings and she has one on the right leg today. READMISSION 02/19/18; this Forrer is a now 75 year old patient we've had in this clinic perhaps 3 times before. I had last looked at her from January 07 December 2016 with an area on the medial left leg. We discharged her on 12/25/16 however  she had to be readmitted on 01/22/17 with  a recurrence. I have in my notes that we discharged her on 20-30 mm stockings although she tells me she was only wearing support hose because she cannot get stockings on predominantly related to her cervical spine surgery/issues. She has had previous ablations done by vein and vascular in [place] including a great saphenous vein ablation on the left with an anterior accessory branch ablation I think both of these were in 2016. On one of the previous visit she had a biopsy noted 2009 that was negative. She is not felt to have an arterial issue. She is not a diabetic. She does have a history of obstructive sleep apnea hypertension asthma as well as chronic venous insufficiency and lymphedema. On this occasion she noted 2 dry scaly patch on her left leg. She tried to put lotion on this it didn't really help. There were 2 open areas.the patient has been seeing her primary physician from 02/05/18 through 02/14/18. She had Unna boots applied. The superior wound now on the lateral left leg has closed but she's had one wound that remains open on the lateral left leg. This is not the same spot as we dealt with in 2018. ABIs in this clinic were 1.3 bilaterally MARISEL, TOSTENSON ([id number]) 02/26/18; patient has a small wound on the left lateral calf. Dimensions are down. She has chronic venous insufficiency and lymphedema. 03/05/18; small open area on the left lateral calf. Dimensions are down. Tightly adherent necrotic debris over the surface of the wound which was difficult to remove. Also the dressing [over collagen] stuck to the wound surface. This was removed with some difficulty as well. Change the primary dressing to Hydrofera Blue ready 03/12/18; small open area on the left lateral calf. Comes in with tightly adherent surface eschar as well as some adherent Hydrofera Blue. 03/19/18; open area on the left lateral calf. Again adherent surface eschar as well as some  adherent Hydrofera Blue nonviable subcutaneous tissue. She complained of pain all week even with the reduction from 4-3 layer compression I put on last week. Also she had an increase in her ankle and calf measurements probably related to the same thing. 03/26/18; open area on the left lateral calf. A very small open area remains here. We used silver alginate starting last week as the Hydrofera Blue seem to stick to the wound bed. In using 4-layer compression 04/02/18; the open area in the left lateral calf at some adherent slough which I removed there is no open area here. We are able to transition her into her own compression stocking. Truthfully I think this is probably his support hose. However this does not maintain skin integrity will be limited. She cannot put over the toe compression stockings on because of neck problems hand problems etc. She is allergic to the lining layer of juxta lites. We might be forced to use extremitease stocking should this fail READMIT 11/24/2018 Patient is now a 75 year old woman who is not a diabetic. She has been in this clinic on at least 3 previous occasions largely with recurrent wounds on her left leg secondary to chronic venous insufficiency with secondary lymphedema. Her situation is complicated by inability to get stockings on and an allergy to neoprene which is apparently a component and at least juxta lites and other stockings. As a result she really has not been wearing any stockings on her legs. She tells [place] that roughly 2 or 3 weeks ago she started noticing a stinging sensation just above her ankle on the  left medial aspect. She has been diagnosed with pseudogout and she wondered whether this was what she was experiencing. She tried to dress this with something she bought at the store however subsequently it pulled skin off and now she has an open wound that is not improving. She has been using Vaseline gauze with a cover bandage. She saw her primary  doctor last week who put an Haematologist on her. ABIs in this clinic was 1.03 on the left 2/12; the area is on the left medial ankle. Odd-looking wound with what looks to be surface epithelialization but a multitude of small petechial openings. This clearly not closed yet. We have been using silver alginate under 3 layer compression with TCA 2/19; the wound area did not look quite as good this week. Necrotic debris over the majority of the wound surface which required debridement. She continues to have a multitude of what looked to be small petechial openings. She reminds [place] that she had a biopsy on this initially during her first outbreak in 2015 in [place] [facility]. She expresses concern about this being a possible melanoma. 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 I assured her that this does not look anything like melanoma. Except for the petechial reaction it does look like a venous insufficiency area and she certainly has evidence of this on both sides 2/26; a difficult area on the left medial ankle. The patient clearly has chronic venous hypertension with some degree of lymphedema. The odd thing about the area is the small petechial hemorrhages. I am not really sure how to explain this. This was present last time and this is not a compression injury. We have been using Hydrofera Blue which I changed to last week 3/4; still using Hydrofera Blue. Aggressive debridement today. She does not have known arterial issues. She has seen Dr. Kellie Simmering at [facility] vein [facility] vascular and and has an ablation on the left. [Anterior accessory branch of the greater saphenous]. From what I remember they did not feel she had an arterial issue. The patient has had this area biopsied in 2009 at [facility] dermatology and by her recollection they said this was "stasis". She is also follow-up with dermatology locally who  thought that this was more of a vascular issue 3/11; using Hydrofera Blue. Aggressive debridement today. She does not have an arterial issue. We are using 3 layer compression although we may need to go to 4. The patient has been in for multiple changes to her wrap since I last saw her a week ago. She says that the area was leaking. I do not have too much more information on what was found 01/19/19 on evaluation today patient was actually being seen for a nurse visit when unfortunately she had the area on her left lateral lower extremity as well as weeping from the right lower extremity that became apparent. Therefore we did end up actually seeing her for a full visit with myself. She is having some pain at this site as well but fortunately nothing too significant at this point. No fevers, chills, nausea, or vomiting noted at this time. 3/18-Patient is back to the clinic with the left leg venous leg ulcer, the ulcer is larger in size, has a surface that is densely adherent with fibrinous tissue, the Hydrofera Blue was used but is densely adherent and there was difficulty in removing it. The right lower extremity was also wrapped  for weeping edema. Patient has a new area over the left lateral foot above the malleolus that is small and appears to have no debris with intact surrounding skin. Patient is on increased dose of Lasix also as a means to edema management 3/25; the patient has a nonhealing venous ulcer on the medial left leg and last week developed a smaller area on the lateral left calf. We have been using Hydrofera Blue with a contact layer. 4/1; no major change in these wounds areas. Left medial and more recently left lateral calf. I tried Iodoflex last week to aid in debridement she did not tolerate this. She stated her pain was terrible all week. She took the top layer of the 4 layer compression off. 4/8; the patient actually looks somewhat better in terms of her more prominent left lateral  calf wound. There is some healthy looking tissue here. She is still complaining of a lot of discomfort. 4/15; patient in a lot of pain secondary to sciatica. She is on a prednisone taper prescribed by her primary physician. She has the 2 areas one on the left medial and more recently a smaller area on the left lateral calf. Both of these just above the malleoli 4/22; her back pain is better but she still states she is very uncomfortable and now feels she is intolerant to the [employer]. No real change in the wounds we have been using Sorbact. She has been previously intolerant to Iodoflex. There is not a lot of option about what we can use to debride this wound under compression that she no doubt needs. sHe states Ultram no longer works for her pain 4/29; no major change in the wounds slightly increased depth. Surface on the original medial wound perhaps somewhat improved however the more recent area on the lateral left ankle is 100% covered in very adherent debris we have been using Sorbact. She tolerates 4 layer compression well and her edema control is a lot better. She has not had to come in for a nurse check 5/6; no major change in the condition of the wounds. She did consent to debridement today which was done with some difficulty. Continuing Sorbact. She did not tolerate Iodoflex. She was in for a check of her compression the day after we wrapped her last week this was adjusted but nothing much was found 5/13; no major change in the condition or area of the wounds. I was able to get a fairly aggressive debridement done on the lateral left leg wound. Even using Sorbact under compression. She came back in on Friday to have the wrap changed. She says she felt uncomfortable on the lateral aspect of her ankle. She has a long history of chronic venous insufficiency including previous ablation surgery on this side. 5/20-Patient returns for wounds on left leg with both wounds covered in slough, with  the lateral leg wound larger in size, she has been in 3 layer compression and felt more comfortable, she describes pain in ankle, in leg and pins and needles in foot, and is about to try Pamelor for this 6/3; wounds on the left lateral and left medial leg. The area medially which is the most recent of the 2 seems to have had the largest increase in [place], [place]. ([id number]) dimensions. We have been using Sorbac to try and debride the surface. She has been to see orthopedics they apparently did a plain x-ray that was indeterminant. Diagnosed her with neuropathy and they have ordered an MRI  to determine if there is underlying osteomyelitis. This was not high on my thought list but I suppose it is prudent. We have advised her to make an appointment with vein and vascular in [place]. She has a history of a left greater saphenous and accessory vein ablations I wonder if there is anything else that can be done from a surgical point of view to help in these difficult refractory wounds. We have previously healed this wound on one occasion but it keeps on reopening [medial side] 6/10; deep tissue culture I did last week I think on the left medial wound showed both moderate E. coli and moderate staph aureus [MSSA]. She is going to require antibiotics and I have chosen Augmentin. We have been using Sorbact and we have made better looking wound surface on both sides but certainly no improvement in wound area. She was back in last Friday apparently for a dressing changes the wrap was hurting her outer left ankle. She has not managed to get a hold of vein and vascular in [place]. We are going to have to make her that appointment 6/17; patient is tolerating the Augmentin. She had an MRI that I think was ordered by orthopedic surgeon this did not show osteomyelitis or an abscess did suggest cellulitis. We have been using Sorbact to the lateral and medial ankles. We have been trying to arrange a follow-up  appointment with vein and vascular in [place] or did her original ablations. We apparently an area sent the request to vein and vascular in [facility] 6/24; patient has completed the Augmentin. We do not yet have a vein and vascular appointment in [place]. I am not sure what the issue is here we have asked her to call tomorrow. We are using Sorbact. Making some improvements and especially the medial wound. Both surfaces however look better medial and lateral. 7/1; the patient has been in contact with vein and vascular in [place] but has not yet received an appointment. Using Sorbact we have gradually improve the wound surface with no improvement in surface area. She is approved for Apligraf but the wound surface still is not completely viable. She has not had to come in for a dressing change 7/8; the patient has an appointment with vein and vascular on 7/31 which is a Friday afternoon. She is concerned about getting back here for [place] to dress her wounds. I think it is important to have them goal for her venous reflux/history of ablations etc. to see if anything else can be done. She apparently tested positive for 1 of the blood tests with regards to lupus and saw a rheumatologist. He has raised the issue of vasculitis again. I have had this thought in the past however the evidence seems overwhelming that this is a venous reflux etiology. If the rheumatologist tells me there is clinical and laboratory investigation is positive for lupus I will rethink this. 7/15; the patient's wound surfaces are quite a bit better. The medial area which was her original wound now has no depth although the lateral wound which was the more recent area actually appears larger. Both with viable surfaces which is indeed better. Using Sorbact. I wanted to use Apligraf on her however there is the issue of the vein and vascular appointment on 7/31 at 2:00 in the afternoon which would not allow her to get back to be  rewrapped and they would no doubt remove the graft 7/22; the patient's wound surfaces have moderate amount of debris although generally look  better. The lateral one is larger with 2 small satellite areas superiorly. We are waiting for her vein and vascular appointment on 7/31. She has been approved for Apligraf which I would like to use after th 7/29; wound surfaces have improved no debridement is required we have been using Sorbact. She sees vein and vascular on Friday with this so question of whether anything can be done to lessen the likelihood of recurrence and/or speed the healing of these areas. She is already had previous ablations. She no doubt has severe venous hypertension 8/5-Patient returns at 1 week, she was in [place] for 3 days by her podiatrist, we have been using so backed to the wound, she has increased pain in both the wounds on the left lower leg especially the more distal one on the lateral aspect 8/12-Patient returns at 1 week and she is agreeable to having debridement in both wounds on her left leg today. We have been using Sorbact, and vascular studies were reviewed at last visit 8/19; the patient arrives with her wounds fairly clean and no debridement is required. We have used Sorbact which is really done a nice job in cleaning up these very difficult wound surfaces. The patient saw Dr. Donzetta Matters of vascular surgery on 7/31. He did not feel that there was an arterial component. He felt that her treated greater saphenous vein is adequately addressed and that the small saphenous vein did not appear to be involved significantly. She was also noted to have deep venous reflux which is not treatable. Dr. Donzetta Matters mentioned the possibility of a central obstructive component leading to reflux and he offered her central venography. She wanted to discuss this or think about it. I have urged her to go ahead with this. She has had recurrent difficult wounds in these areas which do heal but  after months in the clinic. If there is anything that can be done to reduce the likelihood of this I think it is worth it. 9/2 she is still working towards getting follow-up with Dr. Donzetta Matters to schedule her CT. Things are quite a bit worse venography. I put Apligraf on 2 weeks ago on both wounds on the medial and lateral part of her left lower leg. She arrives in clinic today with 3 superficial additional wounds above the area laterally and one below the wound medially. She describes a lot of discomfort. I think these are probably wrapped injuries. Does not look like she has cellulitis. 07/20/2019 on evaluation today patient appears to be doing somewhat poorly in regard to her lower extremity ulcers. She in fact showed signs of erythema in fact we may even be dealing with an infection at this time. Unfortunately I am unsure if this is just infection or if indeed there may be some allergic reaction that occurred as a result of the Apligraf application. With that being said that would be unusual but nonetheless not impossible in this patient is one who is unfortunately allergic to quite a bit. Currently we have been using the Sorbact which seems to do as well as anything for her. I do think we may want to obtain a culture today to see if there is anything showing up there that may need to be addressed. 9/16; noted that last week the wounds look worse in 1 week follow-up of the Apligraf. Using Sorbact as of 2 days ago. She arrives with copious amounts of drainage and new skin breakdown on the back of the left calf. The wounds arm  more substantial bilaterally. There is a fair amount of swelling in the left calf no overt DVT there is edema present I think in the left greater than right thigh. She is supposed to go on 9/28 for CT venography. The wounds on the medial and lateral calf are worse and she has new skin breakdown posteriorly at least new for me. This is almost developing into a circumferential wound  area The Apligraf was taken off last week which I agree with things are not going in the right direction a culture was done we do not have that back yet. She is on Augmentin that she started 2 days ago 9/23; dressing was changed by her nurses on Monday. In general there is no improvement in the wound areas although the area looks less angry than last week. She did get Augmentin for MSSA cultured on the 14th. She still appears to have too much swelling in the left leg even with 3 layer compression 9/30; the patient underwent her procedure on 9/28 by Dr. Donzetta [facility]ters at [facility]. She was discovered to have the common iliac vein measuring 12.2 mm but at the level of L4-L5 measured 3 mm. After stenting it measured 10 mm. 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 Rouleaux flow in the common femoral and femoral vein was observed much improved after stenting. We are using silver alginate to the wounds on the medial and lateral ankle on the left. 4 layer compression 10/7; the patient had fluid swelling around her knee and 4 layer compression. At the advice of vein and vascular this was reduced to 3 layer which she is tolerating better. We have been using silver alginate under 3 layer compression since last Friday 10/14; arrives with the areas on the left ankle looking a lot better. Inflammation in the area also a lot better. She came in for a nurse check on 10/9 10/21; continued nice improvement. Slight improvements in surface area of both the medial and lateral wounds on the left. A lot of the satellite lesions in the weeping erythema around these from stasis dermatitis is resolved. We have been using silver alginate [place], SEVERS ([id number]) 10/28; general improvement in the entire wound areas although not a lot of change in dimensions the wound certainly looks better. There is a lot less in terms of venous inflammation. Continue silver alginate this week however look  towards Hydrofera Blue next week 11/4; very adherent debris on the medial wound left wound is not as bad. We have been using silver alginate. Change to [facility] today 11/11; very adherent debris on both wound areas. She went to vein and vascular last week and follow-up they put in [place] boot on this today. He says the [facility] was adherent. Wound is definitely not as good as last week. Especially on the left there the satellite lesions look more prominent 11/18; absolutely no better. erythema on lateral aspect with tenderness. 09/30/2019 on evaluation today patient appears to actually be doing better. Dr. Dellia Nims did put her on doxycycline last week which I do believe has helped her at this point. Fortunately there is no signs of active infection at this time. No fevers, chills, nausea, vomiting, or diarrhea. I do believe he may want extend the doxycycline for 7 additional days just to ensure everything does completely cleared up the patient is in agreement with that plan. Otherwise she is going require some sharp debridement today 12/2; patient is completing a 2-week course  of doxycycline. I gave her this empirically for inflammation as well as infection when I last saw her 2 weeks ago. All of this seems to be better. She is using silver alginate she has the area on the medial aspect of the larger area laterally and the 2 small satellite regions laterally above the major wound. 12/9; the patient's wound on the left medial and left lateral calf look really quite good. We have been using silver alginate. She saw vein and vascular in follow-up on 10/09/2019. She has had a previous left greater saphenous vein ablation by Dr. Oscar La in 2016. More recently she underwent a left common iliac vein stent by Dr. 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 The swelling is improved and certainly the wounds have improved. The patient shows [place] today area on the right medial calf there is almost no  wound but leaking lymphedema. She says she start this started 3 or 4 days ago. She did not traumatize it. It is not painful. She does not wear compression on that side 12/16; the patient continues to do well laterally. Medially still requiring debridement. The area on the right calf did not materialize to anything and is not currently open. We wrapped this last time. She has support stockings for that leg although I am not sure they are going to provide adequate compression 12/23; the lateral wound looks stable. Medially still requiring debridement for tightly adherent fibrinous debris. We've been using silver alginate. Surface area not any different 12/30; neither wound is any better with regards to surface and the area on the left lateral is larger. I been using silver alginate to the left lateral which look quite good last week and Sorbact to the left medial 11/11/2019. Lateral wound area actually looks better and somewhat smaller. Medial still requires a very aggressive debridement today. We have been using Sorbact on both wound areas 1/13; not much better still adherent debris bilaterally. I been using Sorbact. She has severe venous hypertension. Probably some degree of dermal fibrosis distally. I wonder whether tighter compression might help and I am going to try that today. We also need to work on the bioburden 1/20; using Sorbact. She has severe venous hypertension status post stent placement for pelvic vein compression. We applied gentamicin last time to see if we could reduce bioburden I had some discussion with her today about the use of pentoxifylline. This is occasionally used in this setting for wounds with refractory venous insufficiency. However this interacts with Plavix. She tells me that she was put on this after stent placement for 3 months. She will call Dr. Claretha Cooper office to discuss 1/27; we are using gentamicin under Sorbact. 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 She has a stent. Wound medially is measuring smaller this week. Laterally measuring slightly larger although she has some satellite lesions superiorly 2/3; gentamicin under Sorbact under 4-layer compression. 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 She has a stent on Plavix. Her wounds are measuring smaller this week. More substantially laterally where there is a satellite lesion superiorly. 2/10; gentamicin under Sorbac. 4-layer compression. Patient communicated with Dr. Donzetta Matters at vein and vascular in [place]. He is okay with the patient coming off Plavix I will therefore start her on pentoxifylline for a 1 month trial. In general her wounds look better today. I had some concerns about swelling in the left thigh however she measures 61.5 on the right and 63 on the mid thigh  which does not suggest there is any difficulty. The patient is not describing any pain. 2/17; gentamicin under Sorbac 4-layer compression. She has been on pentoxifylline for 1 week and complains of loose stool. No nausea she is eating and drinking well 2/24; the patient apparently came in 2 days ago for a nurse visit when her wrap fell down. Both areas look a little worse this week macerated medially and satellite lesions laterally. Change to silver alginate today 3/3; wounds are larger today especially medially. She also has more swelling in her foot lower leg and I even noted some swelling in her posterior thigh which is tender. I wonder about the patency of her stent. Fortuitously she sees Dr. Claretha Cooper group on Friday 3/10; Mrs. Footman was seen by vein and vascular on 3/5. The patient underwent ultrasound. There was no evidence of thrombosis involving the IVC no evidence of thrombosis involving the right common iliac vein there is no evidence of thrombosis involving the right external iliac vein the left external vein is also patent. The right common iliac vein stent appears patent  bilateral common femoral veins are compressible and appear patent. I was concerned about the left common iliac stent however it looks like this is functional. She has some edema in the posterior thigh that was tender she still has that this week. I also note they had trouble finding the pulses in her left foot and booked her for an ABI baseline in 4 weeks. She will follow up in 6 months for repeat IVC duplex. The patient stopped the pentoxifylline because of diarrhea. It does not look like that was being effective in any case. I have advised her to go back on her aspirin 81 mg tablet, vascular it also suggested this 3/17; comes in today with her wound surfaces a lot better. The excoriations from last week considerably better probably secondary to the TCA. We have been using silver alginate 3/24; comes in today with smaller wounds both medially and laterally. Both required debridement. There are 2 small satellite areas superiorly laterally. She also has a very odd bandlike area in the mid calf almost looking like there was a weakness in the wrap in a localized area. I would write this off as being this however anteriorly she has a small raised ballotable area that is very tender almost reminiscent of an abscess but there was no obvious purulent surface to it. 02/04/20 upon evaluation today patient appears to be doing fairly well in regard to her wounds today. Fortunately there is no signs of active infection at this time. No fevers, chills, nausea, vomiting, or diarrhea. She has been tolerating the dressing changes without complication. Fortunately I feel like she is showing signs of improvement although has been sometime since have seen her. Nonetheless the area of concern that Dr. Dellia Nims had last week where she had possibly an area of the wrap that was we can allow the leg to bulge appears to be doing significantly better today there is no signs of anything worsening. ELA, MOFFAT  ([id number]) 4/7; the patient's wounds on her medial and lateral left leg continue to contract. We have been using a regular alginate. Last week she developed an area on the right medial lower leg which is probably a venous ulcer as well. 4/14; the wounds on her left medial and lateral lower leg continue to contract. Surface eschar. We have been using regular alginate. The area on the right medial lower leg is closed. We have been putting  both legs under 4-layer contraction. The patient went back to see vein and vascular she had arterial studies done which were apparently "quite good" per the patient although I have not read their notes I have never felt she had an arterial issue. 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 This is been longstanding and refractory to exercise, leg elevation and longstanding use of compression wraps in our clinic as well as compression stockings on the times we have been able to get these to heal 4/21; we thought she actually might be close this week however she arrives in clinic with a lot of edema in her upper left calf and into her posterior thigh. This is been an intermittent problem here. She says the wrap fell down but it was replaced with a nurse visit on Monday. We are using calcium alginate to the wounds and the wound sizes there not terribly larger than last week but there is a lot more edema 4/28; again wound edges are smaller on both sides. Her edema is better controlled than last time. She is obtained her compression pumps from [employer] although they have not been to her home to set these up. 5/5; left medial and left lateral both look stable. I am not sure the medial is any smaller. We have been using calcium alginate under 4-layer compression. She had an area on the right medial. This was eschared today. We have been wrapping this as well. She does not tolerate external compression stockings due to a  history of various contact allergies. She has her compression pumps however the representative from the company is coming on her to show her how to use these tomorrow 5/19; patient with severe chronic venous insufficiency secondary to central venous disease. She had a stent placed in her left common iliac vein. She has done better since but still difficult to control wounds. She comes in today with nothing open on the right leg. Her areas on the left medial and left lateral are just about closed. We are using calcium alginate under 4-layer compression. She is using her external compression pumps at home She only has 15-20 support stockings. States she cannot get anything tighter than that on. 03/30/20-Patient returns at 1 week, the wounds on the left leg are both slightly bigger, the last week she was on 3 layer compression which started to slide down. She is starting to use her lymphedema pumps although she stated on 1 day her right ankle started to swell up and she have to stop that day. Unfortunately the open area seem to oscillate between improving to the point of healing and then flaring up all to do with effectiveness of compression or lack of due to the left leg topography not keeping the compression wraps from rolling down 6/2 patient comes in with a 15/20 mmHg stocking on the right leg. She tells me that she developed a lot of swelling in her ankles she saw orthopedics she was felt to possibly be having a flare of pseudogout versus some other type of arthritis. She was put on steroids for a respiratory issue so that helps with the inflammation. She has not been using the pumps all week. She thinks the left thigh is more swollen than usual and I would agree with that. She has an appointment with Dr. Donzetta Matters 9 days or so from now 6/9; both wounds on the left medial and left lateral are smaller. We have been using calcium alginate under compression.  She does not have an open wound on the right leg  she is using a stocking and her compression pumps things are going well. She has an appointment with Dr. Donzetta Matters with regards to her stent in the left common iliac vein 6/16; the wounds on the left medial and left lateral ankle continues to contract. The patient saw Dr. Donzetta Matters and I think he seems satisfied. Ordered follow-up venous reflux studies on both sides in September. Cautioned that she may need thigh-high stockings. She has been using calcium alginate under compression on the left and her own stocking on the right leg. She tells [place] there are no open wounds on the right 6/23; left lateral is just about closed. Medial required debridement today. We have been using calcium alginate. Extensive discussion about the compression pumps she is only using these on 25 mmHg states she could not take 40 or 30 when the wrap came out to her home to demonstrate these. He said they should not feel tight 6/30; the left lateral wound has a slight amount of eschar. . The area medially is about the same using Hydrofera Blue. 7/7; left lateral wound still has some eschar. I will remove this next week may be closed. The area medially is very small using Hydrofera Blue with improvement. Unfortunately the stockings fell down. Unfortunately the blisters have developed at the edge of where the wrap fell. When this happened she says her legs hurt she did not use her pumps. We are not open Monday for her to come in and change the wraps and she had an appointment yesterday. She also tells me that she is going to have an MRI of her back. She is having pain radiating into her left anterior leg she thinks her from an L5 disc. She saw Dr. Ellene Route of neurosurgery 7/14; the area on the left lateral ankle area is closed. Still a small area medially however it looks better as well. We have been using Hydrofera Blue under 4-layer compression 7/21; left lateral ankle is still closed however her wound on the medial left calf is actually  larger. This is probably because Hydrofera Blue got stuck to the wound. She came in for a nurse change on Friday and will do that again this week I was concerned about the amount of swelling that she had last week however she is using her compression pumps twice a day and the swelling seems well controlled 7/28; remaining wound on the left medial lower leg is smaller. We have been using moistened silver collagen under compression she is coming back for a nurse visit. For reasons that were not really clear she was just keeping her legs elevated and not using her compression pumps. I have asked her to use the compression pumps. She does not have any wounds on the right leg 06/15/20-Patient returns at 2 weeks, her LLE edema is worse and she developed a blister wound that is new and has bigger posterior calf wound on right, we are using Prisma with pad, 4 layer compression. she has been on lasix 40 mg daily 8/18; patient arrives today with things a lot worse than I remember from a few weeks ago. She was seen last week. Noted that her edema was worse and that she now had a left lateral wound as well as deteriorating edema in the medial and posterior part of the lower leg. She says she is using his or her external compression pumps once a day although I wonder about the  compliance. 8/25; weeping area on the right medial lower leg. This had actually gotten a small localized area of her compression stocking wet. On the left side there is a large denuded area on the posterior medial lower leg and smaller area on the lateral. This was not the original areas that we dealt with. 9/1 the patient's wound on the left leg include the left lateral and left posterior. Larger superficial wounds weeping. She has very poor edema control. Tender localized edema in the left lower medial ankle/heel probably because of localized wrap issues. She freely admits she is not using the compression pumps. She has been up on her feet  a lot. She thinks the hydrofera blue is contributing to the pain she is experiencing.. This is a complaint that I have occasionally heard 9/8; really not much improvement. The patient is still complaining of a lot of pain particularly when she uses compression pumps. I switched her to silver alginate last time because she found the Hydrofera Blue to be irritating. I don't hear much difference in her description with the silver alginate. 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 She has follow-up with Dr. Donzetta Matters I think for ultrasound next month DARIANNE, MURALLES ([id number]) 9/15; quite a bit of improvement today. We have less edema and more epithelialization in both of her wound areas on the left medial and left lateral calf. These are not the site of her original wounds in this area. She says she has been using her compression pumps for 30 minutes twice a day, there pain issues that never quite understood. Silver alginate as the primary dressing 9/22; continued improvement. Both areas medially and laterally still have a small open area there is some eschar. She continues to complain of left medial ankle pain. Swelling in the leg is in much better condition. We have been using silver alginate 9/29; continued improvement. Both areas medially and laterally in the left calf look as though they are close some minor surface eschar but I think this is epithelialized. She comes in today saying she has a ruptured disc at L4-L5 cannot bend over to put on her stockings. 10/6; patient comes in today with no open wounds on either leg. However her edema on the left leg in the upper one third of the lower leg is poorly controlled nonpitting. She says that she could not use the pumps for 2 days and then she has been using the last couple of days. It is not clear to me she has been able to get her stocking on. She has back problems. Mrs.  Rooke has severe chronic venous insufficiency with secondary lymphedema. Her venous insufficiency is partially centrally mediated and that she is now post stent in the left common iliac vein. [facility] Thurner's syndrome/physiology]. She follows up with them on 10/15. She wears 20/30 below-knee stockings. She is supposed to use compression pumps at home although I think her compliance about with this is been less than 100%. I have asked her to use these 3 times a day. Finally I think she has lipodermatosclerosis in the left lower leg with an inverted bottle sign. It is been a major problem controlling the edema in the left leg. The right leg we have had wounds on but not as significant a problem is on the left READMISSION 04/12/2021 Mrs. Wilcher is a 75 year old woman we know well in this clinic. She has  severe chronic venous insufficiency. 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 I believe she has had bilateral greater saphenous vein stripping in the past as well. She tells me that this wound opened sometime in March. She had a fall and thinks it was initially abrasion. She developed areas she describes as little blisters on the anterior part of her leg and she saw dermatology and was treated for methicillin staph aureus with several rounds of antibiotics. She has been using support stockings on the left leg and says this is the only thing she can get on. Her compression pump use maybe once a day she says if she did not use one she use the other. She comes in today with incredible swelling in the left leg with a wound on the left posterior calf. She has been using Neosporin to this previously a hydrocolloid. Patient History Information obtained from Patient. Allergies Sulfa (Sulfonamide Antibiotics) (Reaction: facial swelling), latex, Neoprene, Voltaren (Severity: Moderate, Reaction: wheezing asthma symptoms), doxycycline (Reaction: gi) Family History Cancer -  Mother,Paternal Grandparents, Hypertension - Mother,Father, Kidney Disease - Paternal Grandparents, Lung Disease - Paternal Grandparents, No family history of Diabetes, Heart Disease, Hereditary Spherocytosis, Seizures, Stroke, Thyroid Problems, Tuberculosis. Social History Former smoker - quit at age 53 - ended on 04/19/1966, Marital Status - Divorced, Alcohol Use - Never, Drug Use - No History, Caffeine Use - Daily. Medical History Eyes Patient has history of Cataracts Denies history of Glaucoma, Optic Neuritis Ear/Nose/Mouth/Throat Denies history of Chronic sinus problems/congestion, Middle ear problems Hematologic/Lymphatic Denies history of Anemia, Hemophilia, Human Immunodeficiency Virus, Lymphedema, Sickle Cell Disease Respiratory Patient has history of Asthma, Sleep Apnea Denies history of Aspiration, Chronic Obstructive Pulmonary Disease (COPD), Pneumothorax, Tuberculosis Cardiovascular Patient has history of Deep Vein Thrombosis - LLE 2010, Hypertension, Peripheral Venous Disease Denies history of Angina, Arrhythmia, Congestive Heart Failure, Coronary Artery Disease, Hypotension, Myocardial Infarction, Peripheral Arterial Disease, Phlebitis, Vasculitis Gastrointestinal Denies history of Cirrhosis , Colitis, Crohn s, Hepatitis A, Hepatitis B, Hepatitis C Endocrine Denies history of Type I Diabetes, Type II Diabetes Genitourinary Denies history of End Stage Renal Disease Immunological Denies history of Lupus Erythematosus, Raynaud s, Scleroderma Integumentary (Skin) Denies history of History of Burn, History of pressure wounds Musculoskeletal Patient has history of Osteoarthritis - neck Denies history of Gout, Rheumatoid Arthritis, Osteomyelitis Neurologic Denies history of Dementia, Neuropathy, Quadriplegia, Paraplegia, Seizure Disorder Oncologic Patient has history of Received Chemotherapy - interfeon immunotherapy, Received Radiation Psychiatric Denies history of  Anorexia/bulimia, Confinement Anxiety JULANE, CROCK. ([id number]) Hospitalization/Surgery History - Melanoma left shoulder. Medical And Surgical History Notes Constitutional Symptoms (General Health) Vein ablation LLE 2010 Vascular Sx ( vein ablationo) LLE 02/2015 Eyes Horners syndrome Genitourinary Kidney stones Neurologic ct scan showed swollen lymph nodes Oncologic Melanoma (R) shoulder 07/2010; (R) axillary lymphnode removal Review of Systems (ROS) Constitutional Symptoms (General Health) Denies complaints or symptoms of Fatigue, Fever, Chills, Marked Weight Change. Eyes Complains or has symptoms of Dry Eyes. Ear/Nose/Mouth/Throat Denies complaints or symptoms of Difficult clearing ears, Sinusitis. Hematologic/Lymphatic Denies complaints or symptoms of Bleeding / Clotting Disorders, Human Immunodeficiency Virus. Cardiovascular Complains or has symptoms of LE edema. Gastrointestinal Denies complaints or symptoms of Frequent diarrhea, Nausea, Vomiting. Endocrine Denies complaints or symptoms of Hepatitis, Thyroid disease, Polydypsia (Excessive Thirst). Genitourinary Denies complaints or symptoms of Kidney failure/ Dialysis, Incontinence/dribbling. Immunological Denies complaints or symptoms of Hives, Itching. Integumentary (Skin) Complains or has symptoms of Breakdown, Swelling. Neurologic Denies complaints or symptoms of Numbness/parasthesias,  Focal/Weakness. Psychiatric Denies complaints or symptoms of Anxiety, Claustrophobia. Objective Constitutional Sitting or standing Blood Pressure is within target range for patient.. Pulse regular and within target range for patient.Marland Kitchen Respirations regular, non- labored and within target range.. Temperature is normal and within the target range for the patient.Marland Kitchen appears in no distress. Vitals Time Taken: 3:15 PM, Height: 63 in, Source: Stated, Weight: 212 lbs, Source: Measured, BMI: 37.6, Temperature: 98.3 F, Pulse: 74  bpm, Respiratory Rate: 18 breaths/min, Blood Pressure: 161/72 mmHg. Cardiovascular Pedal pulses are palpable in the left foot.. Large amount of swelling in the left leg which I think is all lymphedema. I do not see any evidence of cellulitis no palpable tenderness.. General Notes: Wound exam; the area posterior on the left calf does not have a completely viable surface although I did not debride this in view of the amount of swelling in the left leg but I think this is probably going to need to be debrided going forward. There is no evidence I can see of cellulitis. No blisters or pustules that she was describing. Integumentary (Hair, Skin) Wound #13 status is Open. Original cause of wound was Gradually Appeared. The date acquired was: 02/24/2021. The wound is located on the Left,Distal,Posterior Lower Leg. The wound measures 3cm length x 2.5cm width x 0.1cm depth; 5.89cm^2 area and 0.589cm^3 volume. There is Fat Layer (Subcutaneous Tissue) exposed. There is no tunneling or undermining noted. There is a medium amount of serosanguineous drainage noted. There is medium (34-66%) red, pink granulation within the wound bed. There is a medium (34-66%) amount of necrotic tissue within the wound bed including Adherent Slough. JOURDAN, MALDONADO ([id number]) Assessment Active Problems ICD-10 Chronic venous hypertension (idiopathic) with ulcer and inflammation of left lower extremity Lymphedema, not elsewhere classified Non-pressure chronic ulcer of left calf limited to breakdown of skin Procedures Wound #13 Pre-procedure diagnosis of Wound #13 is a Venous Leg Ulcer located on the Left,Distal,Posterior Lower Leg . There was a Three Layer Compression Therapy Procedure with a pre-treatment ABI of 0.9 by Donnamarie Poag, RN. Post procedure Diagnosis Wound #13: Same as Pre-Procedure Plan Follow-up Appointments: Wound #13 Left,Distal,Posterior Lower Leg: Return Appointment in 1 week. Nurse Visit as  needed Bathing/ Shower/ Hygiene: May shower with wound dressing protected with water repellent cover or cast protector. Anesthetic (Use 'Patient Medications' Section for Anesthetic Order Entry): Lidocaine applied to wound bed Edema Control - Lymphedema / Segmental Compressive Device / Other: Elevate, Exercise Daily and Avoid Standing for Long Periods of Time. Elevate legs to the level of the heart and pump ankles as often as possible Elevate leg(s) parallel to the floor when sitting. Compression Pump: Use compression pump on left lower extremity for 60 minutes, twice daily. WOUND #13: - Lower Leg Wound Laterality: Left, Posterior, Distal Cleanser: Soap and Water Discharge Instructions: Gently cleanse wound with antibacterial soap, rinse and pat dry prior to dressing wounds Primary Dressing: Iodosorb 40 (g) Discharge Instructions: Apply IodoSorb to wound bed only as directed. Secondary Dressing: ABD Pad 5x9 (in/in) Discharge Instructions: Cover with ABD pad Compression Wrap: Profore Lite LF 3 Multilayer Compression Bandaging System Discharge Instructions: Apply 3 multi-layer wrap as prescribed. 1. Wound in the left posterior calf in the setting of uncontrolled lymphedema with stasis dermatitis 2. The patient asked me to use a 3 layer compression on her left leg and I complied with this. I have asked her to use her compression pumps twice a day on top of this. I think her compliance with the  latter has been less than perfect 3. I do not remember discharging her in support stockings. She says this is the only thing she can get on. If that is true she is going to have to use the pumps 3 times a day to control the amount of swelling in that left leg. 4. She is beginning to have skin changes of poorly controlled lymphedema. Nothing else looks suspicious from an infection or dermatologic point of view 5. She tells me that she [facility] and he said that 10 to 15 mm support  stockings were adequate. I doubt that. She also follows in July for ultrasound of her common femoral vein stent. [May Thurner syndrome) I spent 35 minutes in review of this patient's past medical history, face-to-face evaluation and preparation of this record Electronic Signature(s) Signed: 04/12/2021 4:14:58 PM By: Linton Ham MD Entered By: Linton Ham on 04/12/2021 14:33:10 Schou, [name] ([id number]) -------------------------------------------------------------------------------- ROS/PFSH Details Patient Name: TAYONA, SARNOWSKI. Date of Service: 04/12/2021 12:45 PM Medical Record Number: [id number] Patient Account Number: [id number] Date of Birth/Sex: Jun 18, 1946 (75 y.o. F) Treating RN: Donnamarie Poag Primary Care Provider: [name] Other Clinician: Referring Provider: [name] Treating Provider/Extender: Tito Dine in Treatment: 0 Label Progress Note Print Version as History and Physical for this encounter Information Obtained From Patient Constitutional Symptoms (General Health) Complaints and Symptoms: Negative for: Fatigue; Fever; Chills; Marked Weight Change Medical History: Past Medical History Notes: Vein ablation LLE 2010 Vascular Sx ( vein ablationo) LLE 02/2015 Eyes Complaints and Symptoms: Positive for: Dry Eyes Medical History: Positive for: Cataracts Negative for: Glaucoma; Optic Neuritis Past Medical History Notes: Horners syndrome Ear/Nose/Mouth/Throat Complaints and Symptoms: Negative for: Difficult clearing ears; Sinusitis Medical History: Negative for: Chronic sinus problems/congestion; Middle ear problems Hematologic/Lymphatic Complaints and Symptoms: Negative for: Bleeding / Clotting Disorders; Human Immunodeficiency Virus Medical History: Negative for: Anemia; Hemophilia; Human Immunodeficiency Virus; Lymphedema; Sickle Cell Disease Cardiovascular Complaints and Symptoms: Positive for: LE edema Medical  History: Positive for: Deep Vein Thrombosis - LLE 2010; Hypertension; Peripheral Venous Disease Negative for: Angina; Arrhythmia; Congestive Heart Failure; Coronary Artery Disease; Hypotension; Myocardial Infarction; Peripheral Arterial Disease; Phlebitis; Vasculitis Gastrointestinal Complaints and Symptoms: Negative for: Frequent diarrhea; Nausea; Vomiting Medical History: Negative for: Cirrhosis ; Colitis; Crohnos; Hepatitis A; Hepatitis B; Hepatitis C Endocrine OMARA, ALCON. ([id number]) Complaints and Symptoms: Negative for: Hepatitis; Thyroid disease; Polydypsia (Excessive Thirst) Medical History: Negative for: Type I Diabetes; Type II Diabetes Genitourinary Complaints and Symptoms: Negative for: Kidney failure/ Dialysis; Incontinence/dribbling Medical History: Negative for: End Stage Renal Disease Past Medical History Notes: Kidney stones Immunological Complaints and Symptoms: Negative for: Hives; Itching Medical History: Negative for: Lupus Erythematosus; Raynaudos; Scleroderma Integumentary (Skin) Complaints and Symptoms: Positive for: Breakdown; Swelling Medical History: Negative for: History of Burn; History of pressure wounds Neurologic Complaints and Symptoms: Negative for: Numbness/parasthesias; Focal/Weakness Medical History: Negative for: Dementia; Neuropathy; Quadriplegia; Paraplegia; Seizure Disorder Past Medical History Notes: ct scan showed swollen lymph nodes Psychiatric Complaints and Symptoms: Negative for: Anxiety; Claustrophobia Medical History: Negative for: Anorexia/bulimia; Confinement Anxiety Respiratory Medical History: Positive for: Asthma; Sleep Apnea Negative for: Aspiration; Chronic Obstructive Pulmonary Disease (COPD); Pneumothorax; Tuberculosis Musculoskeletal Medical History: Positive for: Osteoarthritis - neck Negative for: Gout; Rheumatoid Arthritis; Osteomyelitis Oncologic Medical History: Positive for: Received  Chemotherapy - interfeon immunotherapy; Received Radiation Past Medical History Notes: Melanoma (R) shoulder 07/2010; (R) axillary lymphnode removal HBO Extended History Items ABRAHAM, MARGULIES ([id number]) Eyes: Cataracts Immunizations Pneumococcal Vaccine: Received Pneumococcal  Vaccination: Yes Immunization Notes: tetanus shot w/in the last 5 years per pt Implantable Devices None Hospitalization / Surgery History Type of Hospitalization/Surgery Melanoma left shoulder Family and Social History Cancer: Yes - Mother,Paternal Grandparents; Diabetes: No; Heart Disease: No; Hereditary Spherocytosis: No; Hypertension: Yes - Mother,Father; Kidney Disease: Yes - Paternal Grandparents; Lung Disease: Yes - Paternal Grandparents; Seizures: No; Stroke: No; Thyroid Problems: No; Tuberculosis: No; Former smoker - quit at age 12 - ended on 04/19/1966; Marital Status - Divorced; Alcohol Use: Never; Drug Use: No History; Caffeine Use: Daily; Financial Concerns: No; Food, Clothing or Shelter Needs: No; Support System Lacking: No; Transportation Concerns: No Electronic Signature(s) Signed: 04/12/2021 2:11:15 PM By: Donnamarie Poag Signed: 04/12/2021 4:14:58 PM By: Linton Ham MD Entered By: Donnamarie Poag on 04/12/2021 13:22:51 HARTLEE, AMEDEE ([id number]) -------------------------------------------------------------------------------- SuperBill Details Patient Name: HARLO, JASO. Date of Service: 04/12/2021 Medical Record Number: [id number] Patient Account Number: [id number] Date of Birth/Sex: 1946-02-23 (75 y.o. F) Treating RN: Cornell Barman Primary Care Provider: [name] Other Clinician: Referring Provider: [name] Treating Provider/Extender: Tito Dine in Treatment: 0 Diagnosis Coding ICD-10 Codes Code Description [phone] Chronic venous hypertension (idiopathic) with ulcer and inflammation of left lower extremity I89.0 Lymphedema, not elsewhere classified L97.221  Non-pressure chronic ulcer of left calf limited to breakdown of skin Facility Procedures CPT4 Code: [id number] Description: [place] VISIT-LEV 3 EST PT Modifier: Quantity: 1 CPT4 Code: [id number] Description: (Facility Use Only) 29581LT - [employer] LWR LT LEG Modifier: Quantity: 1 Physician Procedures CPT4 Code Description: [id number] - WC PHYS LEVEL 4 - EST PT Modifier: Quantity: 1 CPT4 Code Description: ICD-10 Diagnosis Description L97.221 Non-pressure chronic ulcer of left calf limited to breakdown of skin I87.332 Chronic venous hypertension (idiopathic) with ulcer and inflammation of Modifier: left lower extremity Quantity: Electronic Signature(s) Signed: 04/12/2021 4:14:58 PM By: Linton Ham MD Entered By: Linton Ham on 04/12/2021 14:33:41

## 2021-04-13 NOTE — Progress Notes (Signed)
This patient returns to my office for at risk foot care.  This patient requires this care by a professional since this patient will be at risk due to having chronic venous insufficiency.  Patient is wearing a bandage and compression sock left leg..  Wearing compression sock right leg..  Patient is taking trental.This patient is unable to cut nails herself  since the patient cannot reach her  nails.These nails are painful walking and wearing shoes.  This patient presents for at risk foot care today.  General Appearance  Alert, conversant and in no acute stress.  Vascular  Dorsalis pedis and posterior tibial pulses are palpable.    Neurologic  Senn-Weinstein  WNL   B/L.  Nails Thick disfigured discolored nails with subungual debris  from hallux to fifth toes bilaterally. No evidence of bacterial infection or drainage bilaterally. Pincer hallux nails  B/L.  Orthopedic  No limitations of motion  feet .  No crepitus or effusions noted.  No bony pathology or digital deformities noted. DJD [name]  B/L.  Skin  normotropic skin with no porokeratosis noted bilaterally.  No signs of infections or ulcers noted.   Unknown swelling dorsum of right foot.  Onychomycosis  Pain in right toes  Pain in left toes  Consent was obtained for treatment procedures.   Mechanical debridement of nails 1-5  bilaterally performed with a nail nipper.  Filed with dremel without incident. No infection or ulcer.     Return office visit   12  weeks        Told patient to return for periodic foot care and evaluation due to potential at risk complications.   Gardiner Barefoot DPM

## 2021-04-14 ENCOUNTER — Other Ambulatory Visit: Payer: Self-pay | Admitting: *Deleted

## 2021-04-14 DIAGNOSIS — I871 Compression of vein: Secondary | ICD-10-CM

## 2021-04-14 DIAGNOSIS — I872 Venous insufficiency (chronic) (peripheral): Secondary | ICD-10-CM

## 2021-04-19 ENCOUNTER — Encounter: Payer: Medicare Other | Admitting: Internal Medicine

## 2021-04-19 ENCOUNTER — Other Ambulatory Visit: Payer: Self-pay

## 2021-04-19 DIAGNOSIS — L97222 Non-pressure chronic ulcer of left calf with fat layer exposed: Secondary | ICD-10-CM | POA: Diagnosis not present

## 2021-04-19 DIAGNOSIS — I872 Venous insufficiency (chronic) (peripheral): Secondary | ICD-10-CM | POA: Diagnosis not present

## 2021-04-19 DIAGNOSIS — I87332 Chronic venous hypertension (idiopathic) with ulcer and inflammation of left lower extremity: Secondary | ICD-10-CM | POA: Diagnosis not present

## 2021-04-20 NOTE — Progress Notes (Signed)
TALAYLA, DOYEL ([id number]) Visit Report for 04/19/2021 Arrival Information Details Patient Name: [name], [name]. Date of Service: 04/19/2021 2:30 PM Medical Record Number: [id number] Patient Account Number: [id number] Date of Birth/Sex: [birth year] (75 y.o. F) Treating RN: Cornell Barman Primary Care Nikash Mortensen: [name] Other Clinician: Referring Tamura Lasky: [name] Treating Eliora Nienhuis/Extender: Tito Dine in Treatment: 1 Visit Information History Since Last Visit Added or deleted any medications: No Patient Arrived: Ambulatory Had a fall or experienced change in No Arrival Time: 14:54 activities of daily living that may affect Accompanied By: self risk of falls: Transfer Assistance: None Hospitalized since last visit: No Patient Identification Verified: Yes Pain Present Now: Yes Secondary Verification Process Completed: Yes Patient Has Alerts: Yes Patient Alerts: Patient on Blood Thinner ASPIRIN NOT diabetic Electronic Signature(s) Signed: 04/20/2021 3:50:18 PM By: Jeanine Luz Entered By: Jeanine Luz on 04/19/2021 14:56:40 Fornwalt, [name] ([id number]) -------------------------------------------------------------------------------- Encounter Discharge Information Details Patient Name: [name], [name]. Date of Service: 04/19/2021 2:30 PM Medical Record Number: [id number] Patient Account Number: [id number] Date of Birth/Sex: 1945/11/15 (75 y.o. F) Treating RN: Carlene Coria Primary Care Brad Lieurance: [name] Other Clinician: Referring Naika Noto: [name] Treating Daija Routson/Extender: Tito Dine in Treatment: 1 Encounter Discharge Information Items Post Procedure Vitals Discharge Condition: Stable Temperature (F): 98.8 Ambulatory Status: Ambulatory Pulse (bpm): 67 Discharge Destination: Home Respiratory Rate (breaths/min): 18 Transportation: Private Auto Blood Pressure (mmHg): 159/81 Accompanied By: self Schedule  Follow-up Appointment: Yes Clinical Summary of Care: Patient Declined Electronic Signature(s) Signed: 04/20/2021 8:01:43 AM By: Carlene Coria RN Entered By: Carlene Coria on 04/19/2021 15:54:11 Friedl, [name] ([id number]) -------------------------------------------------------------------------------- Lower Extremity Assessment Details Patient Name: [name], [name]. Date of Service: 04/19/2021 2:30 PM Medical Record Number: [id number] Patient Account Number: [id number] Date of Birth/Sex: 11-25-1945 (75 y.o. F) Treating RN: Cornell Barman Primary Care Alijah Akram: [name] Other Clinician: Referring Maitland Muhlbauer: [name] Treating Abdulaziz Toman/Extender: Tito Dine in Treatment: 1 Edema Assessment Assessed: [Left: Yes] [Right: No] Edema: [Left: Ye] [Right: s] Calf Left: Right: Point of Measurement: 32 cm From Medial Instep 55.4 cm Ankle Left: Right: Point of Measurement: 10 cm From Medial Instep 23.7 cm Vascular Assessment Pulses: Dorsalis Pedis Palpable: [Left:Yes] Electronic Signature(s) Signed: 04/19/2021 5:10:04 PM By: [name], [name], RN, CWS, [name], [name] Signed: 04/20/2021 3:50:18 PM By: Jeanine Luz Entered By: Jeanine Luz on 04/19/2021 15:09:40 [name], [name] ([id number]) -------------------------------------------------------------------------------- Multi Wound Chart Details Patient Name: [name], [name]. Date of Service: 04/19/2021 2:30 PM Medical Record Number: [id number] Patient Account Number: [id number] Date of Birth/Sex: 1945/11/17 (75 y.o. F) Treating RN: Cornell Barman Primary Care Ariyan Sinnett: [name] Other Clinician: Referring Mysha Peeler: [name] Treating Zeppelin Beckstrand/Extender: Tito Dine in Treatment: 1 Vital Signs Height(in): 3 Pulse(bpm): 73 Weight(lbs): 212 Blood Pressure(mmHg): 159/81 Body Mass Index(BMI): 38 Temperature(F): 98.8 Respiratory Rate(breaths/min): 18 Photos: [N/A:N/A] Wound Location: Left, Distal,  Posterior Lower Leg N/A N/A Wounding Event: Gradually Appeared N/A N/A Primary Etiology: Venous Leg Ulcer N/A N/A Comorbid History: Cataracts, Asthma, Sleep Apnea, N/A N/A Deep Vein Thrombosis, Hypertension, Peripheral Venous Disease, Osteoarthritis, Received Chemotherapy, Received Radiation Date Acquired: 02/24/2021 N/A N/A Weeks of Treatment: 1 N/A N/A Wound Status: Open N/A N/A Measurements L x W x D (cm) 2.7x2.3x0.1 N/A N/A Area (cm) : 4.877 N/A N/A Volume (cm) : 0.488 N/A N/A % Reduction in Area: 17.20% N/A N/A % Reduction in Volume: 17.10% N/A N/A Classification: Full Thickness Without Exposed N/A N/A Support Structures Exudate Amount: Medium N/A N/A Exudate Type: Serosanguineous N/A N/A  Exudate Color: red, brown N/A N/A Granulation Amount: Medium (34-66%) N/A N/A Granulation Quality: Red, Hyper-granulation N/A N/A Necrotic Amount: Medium (34-66%) N/A N/A Exposed Structures: Fat Layer (Subcutaneous Tissue): N/A N/A Yes Fascia: No Tendon: No Muscle: No Joint: No Bone: No Debridement: Debridement - Excisional N/A N/A Pre-procedure Verification/Time 15:36 N/A N/A Out Taken: Tissue Debrided: Subcutaneous, Slough N/A N/A Level: Skin/Subcutaneous Tissue N/A N/A Debridement Area (sq cm): 6.21 N/A N/A Instrument: Curette N/A N/A Bleeding: Moderate N/A N/A Hemostasis Achieved: Pressure N/A N/A Debridement Treatment Procedure was tolerated well N/A N/A Response: 2.7x2.3x0.1 N/A N/A NEMA, OATLEY ([id number]) Post Debridement Measurements L x W x D (cm) Post Debridement Volume: 0.488 N/A N/A (cm) Procedures Performed: Debridement N/A N/A Treatment Notes Electronic Signature(s) Signed: 04/19/2021 4:10:05 PM By: Linton Ham MD Entered By: Linton Ham on 04/19/2021 15:48:22 Campus, [name] ([id number]) -------------------------------------------------------------------------------- Multi-Disciplinary Care Plan Details Patient Name: [name], [name]. Date of  Service: 04/19/2021 2:30 PM Medical Record Number: [id number] Patient Account Number: [id number] Date of Birth/Sex: 06-07-1946 (75 y.o. F) Treating RN: Cornell Barman Primary Care Evalie Hargraves: [name] Other Clinician: Referring Nicholas Trompeter: [name] Treating Arvie Bartholomew/Extender: Tito Dine in Treatment: 1 Active Inactive Necrotic Tissue Nursing Diagnoses: Impaired tissue integrity related to necrotic/devitalized tissue Knowledge deficit related to management of necrotic/devitalized tissue Goals: Necrotic/devitalized tissue will be minimized in the wound bed Date Initiated: 04/12/2021 Target Resolution Date: 04/27/2021 Goal Status: Active Interventions: Assess patient pain level pre-, during and post procedure and prior to discharge Provide education on necrotic tissue and debridement process Treatment Activities: Apply topical anesthetic as ordered : 04/12/2021 Notes: Orientation to the Wound Care Program Nursing Diagnoses: Knowledge deficit related to the wound healing center program Goals: Patient/caregiver will verbalize understanding of the [facility] Date Initiated: 04/12/2021 Target Resolution Date: 05/04/2021 Goal Status: Active Interventions: Provide education on orientation to the wound center Notes: Venous Leg Ulcer Nursing Diagnoses: Actual venous Insuffiency (use after diagnosis is confirmed) Knowledge deficit related to disease process and management Potential for venous Insuffiency (use before diagnosis confirmed) Goals: Patient will maintain optimal edema control Date Initiated: 04/12/2021 Target Resolution Date: 05/04/2021 Goal Status: Active Interventions: Assess peripheral edema status every visit. Compression as ordered Provide education on venous insufficiency Treatment Activities: Therapeutic compression applied : 04/12/2021 DAMIRA, KEM ([id number]) Notes: Wound/Skin Impairment Nursing Diagnoses: Impaired tissue  integrity Goals: Patient/caregiver will verbalize understanding of skin care regimen Date Initiated: 04/12/2021 Target Resolution Date: 05/04/2021 Goal Status: Active Ulcer/skin breakdown will have a volume reduction of 30% by week 4 Date Initiated: 04/12/2021 Target Resolution Date: 05/12/2021 Goal Status: Active Interventions: Assess ulceration(s) every visit Treatment Activities: Referred to DME Zoha Spranger for dressing supplies : 04/12/2021 Skin care regimen initiated : 04/12/2021 Notes: Electronic Signature(s) Signed: 04/19/2021 5:10:04 PM By: [name], [name], RN, CWS, [name], [name] Entered By: [name], [name], RN, CWS, Kim on 04/19/2021 15:35:21 Kagawa, [name] ([id number]) -------------------------------------------------------------------------------- Pain Assessment Details Patient Name: LEOLIA, VINZANT. Date of Service: 04/19/2021 2:30 PM Medical Record Number: [id number] Patient Account Number: [id number] Date of Birth/Sex: November 08, 1945 (75 y.o. F) Treating RN: Cornell Barman Primary Care Logan Vegh: [name] Other Clinician: Referring Carolann Brazell: [name] Treating Jatin Naumann/Extender: Tito Dine in Treatment: 1 Active Problems Location of Pain Severity and Description of Pain Patient Has Paino Yes Site Locations Pain Location: Generalized Pain Rate the pain. Current Pain Level: 8 Pain Management and Medication Current Pain Management: Notes complained of a bad headache Electronic Signature(s) Signed: 04/19/2021 5:10:04 PM By: [name], [name],  RN, CWS, Leisure centre manager, [name] Signed: 04/20/2021 3:50:18 PM By: Jeanine Luz Entered By: Jeanine Luz on 04/19/2021 14:59:32 Fry, [name] ([id number]) -------------------------------------------------------------------------------- Patient/Caregiver Education Details Patient Name: JANESHIA, CILIBERTO. Date of Service: 04/19/2021 2:30 PM Medical Record Number: [id number] Patient Account Number: [id number] Date of Birth/Gender: 26-Oct-1946 (75  y.o. F) Treating RN: Cornell Barman Primary Care Physician: [name] Other Clinician: Referring Physician: [name] Treating Physician/Extender: Tito Dine in Treatment: 1 Education Assessment Education Provided To: Patient Education Topics Provided Venous: Handouts: Controlling Swelling with Multilayered Compression Wraps Methods: Demonstration, Explain/Verbal Responses: State content correctly Wound Debridement: Handouts: Wound Debridement Methods: Demonstration, Explain/Verbal Responses: State content correctly Electronic Signature(s) Signed: 04/19/2021 5:10:04 PM By: [name], [name], RN, CWS, [name], [name] Entered By: [name], [name], RN, CWS, Kim on 04/19/2021 15:39:34 [name] ([id number]) -------------------------------------------------------------------------------- Wound Assessment Details Patient Name: [name], [name]. Date of Service: 04/19/2021 2:30 PM Medical Record Number: [id number] Patient Account Number: [id number] Date of Birth/Sex: 1946-01-20 (75 y.o. F) Treating RN: Cornell Barman Primary Care Consuella Scurlock: [name] Other Clinician: Referring Ricquel [name]: [name] Treating Ajee Heasley/Extender: Tito Dine in Treatment: 1 Wound Status Wound Number: 13 Primary Venous Leg Ulcer Etiology: Wound Location: Left, Distal, Posterior Lower Leg Wound Open Wounding Event: Gradually Appeared Status: Date Acquired: 02/24/2021 Comorbid Cataracts, Asthma, Sleep Apnea, Deep Vein Thrombosis, Weeks Of Treatment: 1 History: Hypertension, Peripheral Venous Disease, Osteoarthritis, Clustered Wound: No Received Chemotherapy, Received Radiation Photos Wound Measurements Length: (cm) 2.7 Width: (cm) 2.3 Depth: (cm) 0.1 Area: (cm) 4.877 Volume: (cm) 0.488 % Reduction in Area: 17.2% % Reduction in Volume: 17.1% Tunneling: No Undermining: No Wound Description Classification: Full Thickness Without Exposed Support  Structures Exudate Amount: Medium Exudate Type: Serosanguineous Exudate Color: red, brown Foul Odor After Cleansing: No Slough/Fibrino Yes Wound Bed Granulation Amount: Medium (34-66%) Exposed Structure Granulation Quality: Red, Hyper-granulation Fascia Exposed: No Necrotic Amount: Medium (34-66%) Fat Layer (Subcutaneous Tissue) Exposed: Yes Necrotic Quality: Adherent Slough Tendon Exposed: No Muscle Exposed: No Joint Exposed: No Bone Exposed: No Treatment Notes Wound #13 (Lower Leg) Wound Laterality: Left, Posterior, Distal Cleanser Soap and Water Discharge Instruction: Gently cleanse wound with antibacterial soap, rinse and pat dry prior to dressing wounds Peri-Wound Care HARUYE, LAINEZ ([id number]) Topical Primary Dressing Iodosorb 40 (g) Discharge Instruction: Apply IodoSorb to wound bed only as directed. Secondary Dressing ABD Pad 5x9 (in/in) Discharge Instruction: Cover with ABD pad Secured With Compression Wrap Profore Lite LF 3 Multilayer Compression Bandaging System Discharge Instruction: Apply 3 multi-layer wrap as prescribed. Compression Stockings Add-Ons Electronic Signature(s) Signed: 04/19/2021 5:10:04 PM By: [name], [name], RN, CWS, [name], [name] Signed: 04/20/2021 3:50:18 PM By: Jeanine Luz Entered By: Jeanine Luz on 04/19/2021 15:08:02 SEVILLE, BRICK ([id number]) -------------------------------------------------------------------------------- Vitals Details Patient Name: [name], [name]. Date of Service: 04/19/2021 2:30 PM Medical Record Number: [id number] Patient Account Number: [id number] Date of Birth/Sex: Apr 25, 1946 (75 y.o. F) Treating RN: Cornell Barman Primary Care Zalyn Amend: [name] Other Clinician: Referring Evella Kasal: [name] Treating Abie Killian/Extender: Tito Dine in Treatment: 1 Vital Signs Time Taken: 02:56 Temperature (F): 98.8 Height (in): 63 Pulse (bpm): 67 Weight (lbs): 212 Respiratory Rate  (breaths/min): 18 Body Mass Index (BMI): 37.6 Blood Pressure (mmHg): 159/81 Reference Range: 80 - 120 mg / dl Electronic Signature(s) Signed: 04/20/2021 3:50:18 PM By: Jeanine Luz Entered By: Jeanine Luz on 04/19/2021 14:59:04

## 2021-04-20 NOTE — Progress Notes (Signed)
BRUNETTE, LAVALLE ([id number]) Visit Report for 04/19/2021 Debridement Details Patient Name: [name], [name]. Date of Service: 04/19/2021 2:30 PM Medical Record Number: [id number] Patient Account Number: [id number] Date of Birth/Sex: 1946-06-20 (75 y.o. F) Treating RN: Cornell Barman Primary Care Provider: [name] Other Clinician: Referring Provider: [name] Treating Provider/Extender: Tito Dine in Treatment: 1 Debridement Performed for Wound #13 Left,Distal,Posterior Lower Leg Assessment: Performed By: Physician [name] Dillon, MD Debridement Type: Debridement Severity of Tissue Pre Debridement: Fat layer exposed Level of Consciousness (Pre- Awake and Alert procedure): Pre-procedure Verification/Time Out Yes - 15:36 Taken: Total Area Debrided (L x W): 2.7 (cm) x 2.3 (cm) = 6.21 (cm) Tissue and other material Viable, Non-Viable, Slough, Subcutaneous, Biofilm, Slough debrided: Level: Skin/Subcutaneous Tissue Debridement Description: Excisional Instrument: Curette Bleeding: Moderate Hemostasis Achieved: Pressure Response to Treatment: Procedure was tolerated well Level of Consciousness (Post- Awake and Alert procedure): Post Debridement Measurements of Total Wound Length: (cm) 2.7 Width: (cm) 2.3 Depth: (cm) 0.1 Volume: (cm) 0.488 Character of Wound/Ulcer Post Debridement: Requires Further Debridement Severity of Tissue Post Debridement: Fat layer exposed Post Procedure Diagnosis Same as Pre-procedure Electronic Signature(s) Signed: 04/19/2021 4:10:05 PM By: Linton Ham MD Signed: 04/19/2021 5:10:04 PM By: [name], [name], RN, CWS, [name], [name] Entered By: Linton Ham on 04/19/2021 15:50:11 [name], [name] Child ([id number]) -------------------------------------------------------------------------------- HPI Details Patient Name: [name], [name]. Date of Service: 04/19/2021 2:30 PM Medical Record Number: [id number] Patient Account Number:  [id number] Date of Birth/Sex: 08-Feb-1946 (75 y.o. F) Treating RN: Cornell Barman Primary Care Provider: [name] Other Clinician: Referring Provider: [name] Treating Provider/Extender: Tito Dine in Treatment: 1 History of Present Illness HPI Description: Pleasant 75 year old with history of chronic venous insufficiency. No diabetes or peripheral vascular disease. Left ABI 1.29. Questionable history of left lower extremity DVT. She developed a recurrent ulceration on her left lateral calf in December 2015, which she attributes to poor diet and subsequent lower extremity edema. She underwent endovenous laser ablation of her left greater saphenous vein in 2010. She underwent laser ablation of accessory branch of left GSV in April 2016 by Dr. Kellie Simmering at [facility]. She was previously wearing Unna boots, which she tolerated well. Tolerating 2 layer compression and cadexomer iodine. She returns to clinic for follow-up and is without new complaints. She denies any significant pain at this time. She reports persistent pain with pressure. No claudication or ischemic rest pain. No fever or chills. No drainage. READMISSION 11/13/16; this is a 74 year old woman who is not a diabetic. She is here for a review of a painful area on her left medial lower extremity. I note that she was seen here previously last year for wound I believe to be in the same area. At that time she had undergone previously a left greater saphenous vein ablation by Dr. Kellie Simmering and she had a ablation of the anterior accessory branch of the left greater saphenous vein in March 2016. Seeing that the wound actually closed over. In reviewing the history with her today the ulcer in this area has been recurrent. She describes a biopsy of this area in 2009 that only showed stasis physiology. 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 Do not see that she has ever had arterial studies in  the left leg. She tells me she has a follow-up with Dr. Kellie Simmering in roughly 10 days In any case she developed the reopening of this area roughly a month ago. On the background of this she describes rapidly increasing edema which has responded to Lasix 40 mg and metolazone 2.5 mg as well as the patient's lymph massage. She has been told she has both venous insufficiency and lymphedema but she cannot tolerate compression stockings 11/28/16; the patient saw Dr. Kellie Simmering recently. Per the patient he did arterial Dopplers in the office that did not show evidence of arterial insufficiency, per the patient he stated "treat this like an ordinary venous ulcer". She also saw her dermatologist Dr. Ronnald Ramp who felt that this was more of a vascular ulcer. In general things are improving although she arrives today with increasing bilateral lower extremity edema with weeping a deeper fluid through the wound on the left medial leg compatible with some degree of lymphedema 12/04/16; the patient's wound is fully epithelialized but I don't think fully healed. We will do another week of depression with Promogran and TCA however I suspect we'll be able to discharge her next week. This is a very unusual-looking wound which was initially a figure-of-eight type wound lying on its side surrounded by petechial like hemorrhage. She has had venous ablation on this side. She apparently does not have an arterial issue per Dr. Kellie Simmering. She saw her dermatologist thought it was "vascular". Patient is definitely going to need ongoing compression and I talked about this with her today she will go to elastic therapy after she leaves here next week 12/11/16; the patient's wound is not completely closed today. She has surrounding scar tissue and in further discussion  with the patient it would appear that she had ulcers in this area in 2009 for a prolonged period of time ultimately requiring a punch biopsy of this area that only showed venous insufficiency. I did not previously pickup on this part of the history from the patient. 12/18/16; the patient's wound is completely epithelialized. There is no open area here. She has significant bilateral venous insufficiency with secondary lymphedema to a mild-to-moderate degree she does not have compression stockings.. She did not say anything to me when I was in the room, she told our intake nurse that she was still having pain in this area. This isn't unusual recurrent small open area. She is going to go to elastic therapy to obtain compression stockings. 12/25/16; the patient's wound is fully epithelialized. There is no open area here. The patient describes some continued episodic discomfort in this area medial left calf. However everything looks fine and healed here. She is been to elastic therapy and caught herself 15-20 mmHg stockings, they apparently were having trouble getting 20-30 mm stockings in her size 01/22/17; this is a patient we discharged from the clinic a month ago. She has a recurrent open wound on her medial left calf. She had 15 mm support stockings. I told her I thought she needed 20-30 mm compression stockings. She tells me that she has been ill with hospitalization secondary to asthma and is been found to have severe hypokalemia likely secondary to a combination of Lasix and metolazone. This morning she noted blistering and leaking fluid on the posterior part of her left leg. She called our intake nurse urgently and we was saw her this afternoon. She has not had any real discomfort here. I don't know that she's been wearing any stockings on this leg for at  least 2-3 days. ABIs in this clinic were 1.21 on the right and 1.3 on the left. She is previously seen vascular surgery who does not think that  there is a peripheral arterial issue. 01/30/17; Patient arrives with no open wound on the left leg. She has been to elastic therapy and obtained 20-59mmhg below knee stockings and she has one on the right leg today. READMISSION 02/19/18; this Edell is a now 75 year old patient we've had in this clinic perhaps 3 times before. I had last looked at her from January 07 December 2016 with an area on the medial left leg. We discharged her on 12/25/16 however she had to be readmitted on 01/22/17 with a recurrence. I have in my notes that we discharged her on 20-30 mm stockings although she tells me she was only wearing support hose because she cannot get stockings on predominantly related to her cervical spine surgery/issues. She has had previous ablations done by vein and vascular in [place] including a great saphenous vein ablation on the left with an anterior accessory branch ablation I think both of these were in 2016. On one of the previous visit she had a biopsy noted 2009 that was negative. She is not felt to have an arterial issue. She is not a diabetic. She does have a history of obstructive sleep apnea hypertension asthma as well as chronic venous insufficiency and lymphedema. On this occasion she noted 2 dry scaly patch on her left leg. She tried to put lotion on this it didn't really help. There were 2 open areas.the patient has been seeing her primary physician from 02/05/18 through 02/14/18. She had Unna boots applied. The superior wound now on the lateral left leg has closed but she's had one wound that remains open on the lateral left leg. This is not the same spot as we dealt with in 2018. ABIs in this clinic were 1.3 bilaterally [name], [name] ([id number]) 02/26/18; patient has a small wound on the left lateral calf. Dimensions are down. She has chronic venous insufficiency and lymphedema. 03/05/18; small open area on the left lateral calf. Dimensions are down. Tightly adherent necrotic debris  over the surface of the wound which was difficult to remove. Also the dressing [over collagen] stuck to the wound surface. This was removed with some difficulty as well. Change the primary dressing to Hydrofera Blue ready 03/12/18; small open area on the left lateral calf. Comes in with tightly adherent surface eschar as well as some adherent Hydrofera Blue. 03/19/18; open area on the left lateral calf. Again adherent surface eschar as well as some adherent Hydrofera Blue nonviable subcutaneous tissue. She complained of pain all week even with the reduction from 4-3 layer compression I put on last week. Also she had an increase in her ankle and calf measurements probably related to the same thing. 03/26/18; open area on the left lateral calf. A very small open area remains here. We used silver alginate starting last week as the Hydrofera Blue seem to stick to the wound bed. In using 4-layer compression 04/02/18; the open area in the left lateral calf at some adherent slough which I removed there is no open area here. We are able to transition her into her own compression stocking. Truthfully I think this is probably his support hose. However this does not maintain skin integrity will be limited. She cannot put over the toe compression stockings on because of neck problems hand problems etc. She is allergic to the lining layer of  juxta lites. We might be forced to use extremitease stocking should this fail READMIT 11/24/2018 Patient is now a 75 year old woman who is not a diabetic. She has been in this clinic on at least 3 previous occasions largely with recurrent wounds on her left leg secondary to chronic venous insufficiency with secondary lymphedema. Her situation is complicated by inability to get stockings on and an allergy to neoprene which is apparently a component and at least juxta lites and other stockings. As a result she really has not been wearing any stockings on her legs. She tells [place]  that roughly 2 or 3 weeks ago she started noticing a stinging sensation just above her ankle on the left medial aspect. She has been diagnosed with pseudogout and she wondered whether this was what she was experiencing. She tried to dress this with something she bought at the store however subsequently it pulled skin off and now she has an open wound that is not improving. She has been using Vaseline gauze with a cover bandage. She saw her primary doctor last week who put an Haematologist on her. ABIs in this clinic was 1.03 on the left 2/12; the area is on the left medial ankle. Odd-looking wound with what looks to be surface epithelialization but a multitude of small petechial openings. This clearly not closed yet. We have been using silver alginate under 3 layer compression with TCA 2/19; the wound area did not look quite as good this week. Necrotic debris over the majority of the wound surface which required debridement. She continues to have a multitude of what looked to be small petechial openings. She reminds [place] that she had a biopsy on this initially during her first outbreak in 2015 in [place] dermatology. She expresses concern about this being a possible melanoma. 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 I assured her that this does not look anything like melanoma. Except for the petechial reaction it does look like a venous insufficiency area and she certainly has evidence of this on both sides 2/26; a difficult area on the left medial ankle. The patient clearly has chronic venous hypertension with some degree of lymphedema. The odd thing about the area is the small petechial hemorrhages. I am not really sure how to explain this. This was present last time and this is not a compression injury. We have been using Hydrofera Blue which I changed to last week 3/4; still using Hydrofera Blue. Aggressive  debridement today. She does not have known arterial issues. She has seen Dr. Kellie Simmering at [facility] vein and vascular and and has an ablation on the left. [Anterior accessory branch of the greater saphenous]. From what I remember they did not feel she had an arterial issue. The patient has had this area biopsied in 2009 at [facility] dermatology and by her recollection they said this was "stasis". She is also follow-up with dermatology locally who thought that this was more of a vascular issue 3/11; using Hydrofera Blue. Aggressive debridement today. She does not have an arterial issue. We are using 3 layer compression although we may need to go to 4. The patient has been in for multiple changes to her wrap since I last saw her a week ago. She says that the area was leaking. I do not have too much more information on what was found 01/19/19 on evaluation today patient was actually being seen for a nurse visit  when unfortunately she had the area on her left lateral lower extremity as well as weeping from the right lower extremity that became apparent. Therefore we did end up actually seeing her for a full visit with myself. She is having some pain at this site as well but fortunately nothing too significant at this point. No fevers, chills, nausea, or vomiting noted at this time. 3/18-Patient is back to the clinic with the left leg venous leg ulcer, the ulcer is larger in size, has a surface that is densely adherent with fibrinous tissue, the Hydrofera Blue was used but is densely adherent and there was difficulty in removing it. The right lower extremity was also wrapped for weeping edema. Patient has a new area over the left lateral foot above the malleolus that is small and appears to have no debris with intact surrounding skin. Patient is on increased dose of Lasix also as a means to edema management 3/25; the patient has a nonhealing venous ulcer on the medial left leg and last week developed a smaller  area on the lateral left calf. We have been using Hydrofera Blue with a contact layer. 4/1; no major change in these wounds areas. Left medial and more recently left lateral calf. I tried Iodoflex last week to aid in debridement she did not tolerate this. She stated her pain was terrible all week. She took the top layer of the 4 layer compression off. 4/8; the patient actually looks somewhat better in terms of her more prominent left lateral calf wound. There is some healthy looking tissue here. She is still complaining of a lot of discomfort. 4/15; patient in a lot of pain secondary to sciatica. She is on a prednisone taper prescribed by her primary physician. She has the 2 areas one on the left medial and more recently a smaller area on the left lateral calf. Both of these just above the malleoli 4/22; her back pain is better but she still states she is very uncomfortable and now feels she is intolerant to the [employer]. No real change in the wounds we have been using Sorbact. She has been previously intolerant to Iodoflex. There is not a lot of option about what we can use to debride this wound under compression that she no doubt needs. sHe states Ultram no longer works for her pain 4/29; no major change in the wounds slightly increased depth. Surface on the original medial wound perhaps somewhat improved however the more recent area on the lateral left ankle is 100% covered in very adherent debris we have been using Sorbact. She tolerates 4 layer compression well and her edema control is a lot better. She has not had to come in for a nurse check 5/6; no major change in the condition of the wounds. She did consent to debridement today which was done with some difficulty. Continuing Sorbact. She did not tolerate Iodoflex. She was in for a check of her compression the day after we wrapped her last week this was adjusted but nothing much was found 5/13; no major change in the condition or area of  the wounds. I was able to get a fairly aggressive debridement done on the lateral left leg wound. Even using Sorbact under compression. She came back in on Friday to have the wrap changed. She says she felt uncomfortable on the lateral aspect of her ankle. She has a long history of chronic venous insufficiency including previous ablation surgery on this side. 5/20-Patient returns for wounds on  left leg with both wounds covered in slough, with the lateral leg wound larger in size, she has been in 3 layer compression and felt more comfortable, she describes pain in ankle, in leg and pins and needles in foot, and is about to try Pamelor for this 6/3; wounds on the left lateral and left medial leg. The area medially which is the most recent of the 2 seems to have had the largest increase in [name], [name]. ([id number]) dimensions. We have been using Sorbac to try and debride the surface. She has been to see orthopedics they apparently did a plain x-ray that was indeterminant. Diagnosed her with neuropathy and they have ordered an MRI to determine if there is underlying osteomyelitis. This was not high on my thought list but I suppose it is prudent. We have advised her to make an appointment with vein and vascular in [place]. She has a history of a left greater saphenous and accessory vein ablations I wonder if there is anything else that can be done from a surgical point of view to help in these difficult refractory wounds. We have previously healed this wound on one occasion but it keeps on reopening [medial side] 6/10; deep tissue culture I did last week I think on the left medial wound showed both moderate E. coli and moderate staph aureus [MSSA]. She is going to require antibiotics and I have chosen Augmentin. We have been using Sorbact and we have made better looking wound surface on both sides but certainly no improvement in wound area. She was back in last Friday apparently for a dressing changes  the wrap was hurting her outer left ankle. She has not managed to get a hold of vein and vascular in [place]. We are going to have to make her that appointment 6/17; patient is tolerating the Augmentin. She had an MRI that I think was ordered by orthopedic surgeon this did not show osteomyelitis or an abscess did suggest cellulitis. We have been using Sorbact to the lateral and medial ankles. We have been trying to arrange a follow-up appointment with vein and vascular in [place] or did her original ablations. We apparently an area sent the request to vein and vascular in [facility] 6/24; patient has completed the Augmentin. We do not yet have a vein and vascular appointment in [place]. I am not sure what the issue is here we have asked her to call tomorrow. We are using Sorbact. Making some improvements and especially the medial wound. Both surfaces however look better medial and lateral. 7/1; the patient has been in contact with vein and vascular in [place] but has not yet received an appointment. Using Sorbact we have gradually improve the wound surface with no improvement in surface area. She is approved for Apligraf but the wound surface still is not completely viable. She has not had to come in for a dressing change 7/8; the patient has an appointment with vein and vascular on 7/31 which is a Friday afternoon. She is concerned about getting back here for [place] to dress her wounds. I think it is important to have them goal for her venous reflux/history of ablations etc. to see if anything else can be done. She apparently tested positive for 1 of the blood tests with regards to lupus and saw a rheumatologist. He has raised the issue of vasculitis again. I have had this thought in the past however the evidence seems overwhelming that this is a venous reflux etiology. If the rheumatologist tells  me there is clinical and laboratory investigation is positive for lupus I will rethink  this. 7/15; the patient's wound surfaces are quite a bit better. The medial area which was her original wound now has no depth although the lateral wound which was the more recent area actually appears larger. Both with viable surfaces which is indeed better. Using Sorbact. I wanted to use Apligraf on her however there is the issue of the vein and vascular appointment on 7/31 at 2:00 in the afternoon which would not allow her to get back to be rewrapped and they would no doubt remove the graft 7/22; the patient's wound surfaces have moderate amount of debris although generally look better. The lateral one is larger with 2 small satellite areas superiorly. We are waiting for her vein and vascular appointment on 7/31. She has been approved for Apligraf which I would like to use after th 7/29; wound surfaces have improved no debridement is required we have been using Sorbact. She sees vein and vascular on Friday with this so question of whether anything can be done to lessen the likelihood of recurrence and/or speed the healing of these areas. She is already had previous ablations. She no doubt has severe venous hypertension 8/5-Patient returns at 1 week, she was in [place] for 3 days by her podiatrist, we have been using so backed to the wound, she has increased pain in both the wounds on the left lower leg especially the more distal one on the lateral aspect 8/12-Patient returns at 1 week and she is agreeable to having debridement in both wounds on her left leg today. We have been using Sorbact, and vascular studies were reviewed at last visit 8/19; the patient arrives with her wounds fairly clean and no debridement is required. We have used Sorbact which is really done a nice job in cleaning up these very difficult wound surfaces. The patient saw Dr. Donzetta Matters of vascular surgery on 7/31. He did not feel that there was an arterial component. He felt that her treated greater saphenous vein is adequately  addressed and that the small saphenous vein did not appear to be involved significantly. She was also noted to have deep venous reflux which is not treatable. Dr. Donzetta Matters mentioned the possibility of a central obstructive component leading to reflux and he offered her central venography. She wanted to discuss this or think about it. I have urged her to go ahead with this. She has had recurrent difficult wounds in these areas which do heal but after months in the clinic. If there is anything that can be done to reduce the likelihood of this I think it is worth it. 9/2 she is still working towards getting follow-up with Dr. Donzetta Matters to schedule her CT. Things are quite a bit worse venography. I put Apligraf on 2 weeks ago on both wounds on the medial and lateral part of her left lower leg. She arrives in clinic today with 3 superficial additional wounds above the area laterally and one below the wound medially. She describes a lot of discomfort. I think these are probably wrapped injuries. Does not look like she has cellulitis. 07/20/2019 on evaluation today patient appears to be doing somewhat poorly in regard to her lower extremity ulcers. She in fact showed signs of erythema in fact we may even be dealing with an infection at this time. Unfortunately I am unsure if this is just infection or if indeed there may be some allergic reaction that occurred as  a result of the Apligraf application. With that being said that would be unusual but nonetheless not impossible in this patient is one who is unfortunately allergic to quite a bit. Currently we have been using the Sorbact which seems to do as well as anything for her. I do think we may want to obtain a culture today to see if there is anything showing up there that may need to be addressed. 9/16; noted that last week the wounds look worse in 1 week follow-up of the Apligraf. Using Sorbact as of 2 days ago. She arrives with copious amounts of drainage and new  skin breakdown on the back of the left calf. The wounds arm more substantial bilaterally. There is a fair amount of swelling in the left calf no overt DVT there is edema present I think in the left greater than right thigh. She is supposed to go on 9/28 for CT venography. The wounds on the medial and lateral calf are worse and she has new skin breakdown posteriorly at least new for me. This is almost developing into a circumferential wound area The Apligraf was taken off last week which I agree with things are not going in the right direction a culture was done we do not have that back yet. She is on Augmentin that she started 2 days ago 9/23; dressing was changed by her nurses on Monday. In general there is no improvement in the wound areas although the area looks less angry than last week. She did get Augmentin for MSSA cultured on the 14th. She still appears to have too much swelling in the left leg even with 3 layer compression 9/30; the patient underwent her procedure on 9/28 by Dr. Donzetta Matters at [facility]. She was discovered to have the common iliac vein measuring 12.2 mm but at the level of L4-L5 measured 3 mm. After stenting it measured 10 mm. 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 Rouleaux flow in the common femoral and femoral vein was observed much improved after stenting. We are using silver alginate to the wounds on the medial and lateral ankle on the left. 4 layer compression 10/7; the patient had fluid swelling around her knee and 4 layer compression. At the advice of vein and vascular this was reduced to 3 layer which she is tolerating better. We have been using silver alginate under 3 layer compression since last Friday 10/14; arrives with the areas on the left ankle looking a lot better. Inflammation in the area also a lot better. She came in for a nurse check on 10/9 10/21; continued nice improvement. Slight improvements in surface area of both the  medial and lateral wounds on the left. A lot of the satellite lesions in the weeping erythema around these from stasis dermatitis is resolved. We have been using silver alginate [name], [name] ([id number]) 10/28; general improvement in the entire wound areas although not a lot of change in dimensions the wound certainly looks better. There is a lot less in terms of venous inflammation. Continue silver alginate this week however look towards Hydrofera Blue next week 11/4; very adherent debris on the medial wound left wound is not as bad. We have been using silver alginate. Change to [facility] today 11/11; very adherent debris on both wound areas. She went to vein and vascular last week and follow-up they put in [place] boot on this today. He says the [facility] was adherent. Wound is definitely not as  good as last week. Especially on the left there the satellite lesions look more prominent 11/18; absolutely no better. erythema on lateral aspect with tenderness. 09/30/2019 on evaluation today patient appears to actually be doing better. Dr. Dellia Nims did put her on doxycycline last week which I do believe has helped her at this point. Fortunately there is no signs of active infection at this time. No fevers, chills, nausea, vomiting, or diarrhea. I do believe he may want extend the doxycycline for 7 additional days just to ensure everything does completely cleared up the patient is in agreement with that plan. Otherwise she is going require some sharp debridement today 12/2; patient is completing a 2-week course of doxycycline. I gave her this empirically for inflammation as well as infection when I last saw her 2 weeks ago. All of this seems to be better. She is using silver alginate she has the area on the medial aspect of the larger area laterally and the 2 small satellite regions laterally above the major wound. 12/9; the patient's wound on the left medial and left lateral calf look really  quite good. We have been using silver alginate. She saw vein and vascular in follow-up on 10/09/2019. She has had a previous left greater saphenous vein ablation by Dr. Oscar La in 2016. More recently she underwent a left common iliac vein stent by Dr. 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 The swelling is improved and certainly the wounds have improved. The patient shows [place] today area on the right medial calf there is almost no wound but leaking lymphedema. She says she start this started 3 or 4 days ago. She did not traumatize it. It is not painful. She does not wear compression on that side 12/16; the patient continues to do well laterally. Medially still requiring debridement. The area on the right calf did not materialize to anything and is not currently open. We wrapped this last time. She has support stockings for that leg although I am not sure they are going to provide adequate compression 12/23; the lateral wound looks stable. Medially still requiring debridement for tightly adherent fibrinous debris. We've been using silver alginate. Surface area not any different 12/30; neither wound is any better with regards to surface and the area on the left lateral is larger. I been using silver alginate to the left lateral which look quite good last week and Sorbact to the left medial 11/11/2019. Lateral wound area actually looks better and somewhat smaller. Medial still requires a very aggressive debridement today. We have been using Sorbact on both wound areas 1/13; not much better still adherent debris bilaterally. I been using Sorbact. She has severe venous hypertension. Probably some degree of dermal fibrosis distally. I wonder whether tighter compression might help and I am going to try that today. We also need to work on the bioburden 1/20; using Sorbact. She has severe venous hypertension status post stent placement for pelvic vein compression. We applied gentamicin last time to  see if we could reduce bioburden I had some discussion with her today about the use of pentoxifylline. This is occasionally used in this setting for wounds with refractory venous insufficiency. However this interacts with Plavix. She tells me that she was put on this after stent placement for 3 months. She will call Dr. Claretha Cooper office to discuss 1/27; we are using gentamicin under Sorbact. 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 She has a stent. Wound medially is measuring smaller this week.  Laterally measuring slightly larger although she has some satellite lesions superiorly 2/3; gentamicin under Sorbact under 4-layer compression. 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 She has a stent on Plavix. Her wounds are measuring smaller this week. More substantially laterally where there is a satellite lesion superiorly. 2/10; gentamicin under Sorbac. 4-layer compression. Patient communicated with Dr. Donzetta Matters at vein and vascular in [place]. He is okay with the patient coming off Plavix I will therefore start her on pentoxifylline for a 1 month trial. In general her wounds look better today. I had some concerns about swelling in the left thigh however she measures 61.5 on the right and 63 on the mid thigh which does not suggest there is any difficulty. The patient is not describing any pain. 2/17; gentamicin under Sorbac 4-layer compression. She has been on pentoxifylline for 1 week and complains of loose stool. No nausea she is eating and drinking well 2/24; the patient apparently came in 2 days ago for a nurse visit when her wrap fell down. Both areas look a little worse this week macerated medially and satellite lesions laterally. Change to silver alginate today 3/3; wounds are larger today especially medially. She also has more swelling in her foot lower leg and I even noted some swelling in her posterior thigh which is tender. I wonder about  the patency of her stent. Fortuitously she sees Dr. Claretha Cooper group on Friday 3/10; [name] was seen by vein and vascular on 3/5. The patient underwent ultrasound. There was no evidence of thrombosis involving the IVC no evidence of thrombosis involving the right common iliac vein there is no evidence of thrombosis involving the right external iliac vein the left external vein is also patent. The right common iliac vein stent appears patent bilateral common femoral veins are compressible and appear patent. I was concerned about the left common iliac stent however it looks like this is functional. She has some edema in the posterior thigh that was tender she still has that this week. I also note they had trouble finding the pulses in her left foot and booked her for an ABI baseline in 4 weeks. She will follow up in 6 months for repeat IVC duplex. The patient stopped the pentoxifylline because of diarrhea. It does not look like that was being effective in any case. I have advised her to go back on her aspirin 81 mg tablet, vascular it also suggested this 3/17; comes in today with her wound surfaces a lot better. The excoriations from last week considerably better probably secondary to the TCA. We have been using silver alginate 3/24; comes in today with smaller wounds both medially and laterally. Both required debridement. There are 2 small satellite areas superiorly laterally. She also has a very odd bandlike area in the mid calf almost looking like there was a weakness in the wrap in a localized area. I would write this off as being this however anteriorly she has a small raised ballotable area that is very tender almost reminiscent of an abscess but there was no obvious purulent surface to it. 02/04/20 upon evaluation today patient appears to be doing fairly well in regard to her wounds today. Fortunately there is no signs of active infection at this time. No fevers, chills, nausea, vomiting, or  diarrhea. She has been tolerating the dressing changes without complication. Fortunately I feel like she is showing signs of improvement although has been sometime since have seen her. Nonetheless the area of concern that  Dr. Dellia Nims had last week where she had possibly an area of the wrap that was we can allow the leg to bulge appears to be doing significantly better today there is no signs of anything worsening. [name], [name] ([id number]) 4/7; the patient's wounds on her medial and lateral left leg continue to contract. We have been using a regular alginate. Last week she developed an area on the right medial lower leg which is probably a venous ulcer as well. 4/14; the wounds on her left medial and lateral lower leg continue to contract. Surface eschar. We have been using regular alginate. The area on the right medial lower leg is closed. We have been putting both legs under 4-layer contraction. The patient went back to see vein and vascular she had arterial studies done which were apparently "quite good" per the patient although I have not read their notes I have never felt she had an arterial issue. 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 This is been longstanding and refractory to exercise, leg elevation and longstanding use of compression wraps in our clinic as well as compression stockings on the times we have been able to get these to heal 4/21; we thought she actually might be close this week however she arrives in clinic with a lot of edema in her upper left calf and into her posterior thigh. This is been an intermittent problem here. She says the wrap fell down but it was replaced with a nurse visit on Monday. We are using calcium alginate to the wounds and the wound sizes there not terribly larger than last week but there is a lot more edema 4/28; again wound edges are smaller on both sides. Her edema is better controlled than last time.  She is obtained her compression pumps from [employer] although they have not been to her home to set these up. 5/5; left medial and left lateral both look stable. I am not sure the medial is any smaller. We have been using calcium alginate under 4-layer compression. oShe had an area on the right medial. This was eschared today. We have been wrapping this as well. She does not tolerate external compression stockings due to a history of various contact allergies. She has her compression pumps however the representative from the company is coming on her to show her how to use these tomorrow 5/19; patient with severe chronic venous insufficiency secondary to central venous disease. She had a stent placed in her left common iliac vein. She has done better since but still difficult to control wounds. She comes in today with nothing open on the right leg. Her areas on the left medial and left lateral are just about closed. We are using calcium alginate under 4-layer compression. She is using her external compression pumps at home She only has 15-20 support stockings. States she cannot get anything tighter than that on. 03/30/20-Patient returns at 1 week, the wounds on the left leg are both slightly bigger, the last week she was on 3 layer compression which started to slide down. She is starting to use her lymphedema pumps although she stated on 1 day her right ankle started to swell up and she have to stop that day. Unfortunately the open area seem to oscillate between improving to the point of healing and then flaring up all to do with effectiveness of compression or lack of due to the left leg topography not keeping the compression wraps from rolling down 6/2  patient comes in with a 15/20 mmHg stocking on the right leg. She tells me that she developed a lot of swelling in her ankles she saw orthopedics she was felt to possibly be having a flare of pseudogout versus some other type of arthritis.  She was put on steroids for a respiratory issue so that helps with the inflammation. She has not been using the pumps all week. She thinks the left thigh is more swollen than usual and I would agree with that. She has an appointment with Dr. Donzetta Matters 9 days or so from now 6/9; both wounds on the left medial and left lateral are smaller. We have been using calcium alginate under compression. She does not have an open wound on the right leg she is using a stocking and her compression pumps things are going well. She has an appointment with Dr. Donzetta Matters with regards to her stent in the left common iliac vein 6/16; the wounds on the left medial and left lateral ankle continues to contract. The patient saw Dr. Donzetta Matters and I think he seems satisfied. Ordered follow-up venous reflux studies on both sides in September. Cautioned that she may need thigh-high stockings. She has been using calcium alginate under compression on the left and her own stocking on the right leg. She tells [place] there are no open wounds on the right 6/23; left lateral is just about closed. Medial required debridement today. We have been using calcium alginate. Extensive discussion about the compression pumps she is only using these on 25 mmHg states she could not take 40 or 30 when the wrap came out to her home to demonstrate these. He said they should not feel tight 6/30; the left lateral wound has a slight amount of eschar. . The area medially is about the same using Hydrofera Blue. 7/7; left lateral wound still has some eschar. I will remove this next week may be closed. The area medially is very small using Hydrofera Blue with improvement. Unfortunately the stockings fell down. Unfortunately the blisters have developed at the edge of where the wrap fell. When this happened she says her legs hurt she did not use her pumps. We are not open Monday for her to come in and change the wraps and she had an appointment yesterday. She also tells me  that she is going to have an MRI of her back. She is having pain radiating into her left anterior leg she thinks her from an L5 disc. She saw Dr. Ellene Route of neurosurgery 7/14; the area on the left lateral ankle area is closed. Still a small area medially however it looks better as well. We have been using Hydrofera Blue under 4-layer compression 7/21; left lateral ankle is still closed however her wound on the medial left calf is actually larger. This is probably because Hydrofera Blue got stuck to the wound. She came in for a nurse change on Friday and will do that again this week I was concerned about the amount of swelling that she had last week however she is using her compression pumps twice a day and the swelling seems well controlled 7/28; remaining wound on the left medial lower leg is smaller. We have been using moistened silver collagen under compression she is coming back for a nurse visit. For reasons that were not really clear she was just keeping her legs elevated and not using her compression pumps. I have asked her to use the compression pumps. She does not have any wounds on  the right leg 06/15/20-Patient returns at 2 weeks, her LLE edema is worse and she developed a blister wound that is new and has bigger posterior calf wound on right, we are using Prisma with pad, 4 layer compression. she has been on lasix 40 mg daily 8/18; patient arrives today with things a lot worse than I remember from a few weeks ago. She was seen last week. Noted that her edema was worse and that she now had a left lateral wound as well as deteriorating edema in the medial and posterior part of the lower leg. She says she is using his or her external compression pumps once a day although I wonder about the compliance. 8/25; weeping area on the right medial lower leg. This had actually gotten a small localized area of her compression stocking wet. oOn the left side there is a large denuded area on the posterior  medial lower leg and smaller area on the lateral. This was not the original areas that we dealt with. 9/1 the patient's wound on the left leg include the left lateral and left posterior. Larger superficial wounds weeping. She has very poor edema control. Tender localized edema in the left lower medial ankle/heel probably because of localized wrap issues. She freely admits she is not using the compression pumps. She has been up on her feet a lot. She thinks the hydrofera blue is contributing to the pain she is experiencing.. This is a complaint that I have occasionally heard 9/8; really not much improvement. The patient is still complaining of a lot of pain particularly when she uses compression pumps. I switched her to silver alginate last time because she found the Hydrofera Blue to be irritating. I don't hear much difference in her description with the silver alginate. 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 She has follow-up with Dr. Donzetta Matters I think for ultrasound next month [name], [name] ([id number]) 9/15; quite a bit of improvement today. We have less edema and more epithelialization in both of her wound areas on the left medial and left lateral calf. These are not the site of her original wounds in this area. She says she has been using her compression pumps for 30 minutes twice a day, there pain issues that never quite understood. Silver alginate as the primary dressing 9/22; continued improvement. Both areas medially and laterally still have a small open area there is some eschar. She continues to complain of left medial ankle pain. Swelling in the leg is in much better condition. We have been using silver alginate 9/29; continued improvement. Both areas medially and laterally in the left calf look as though they are close some minor surface eschar but I think this is epithelialized. She comes in today saying  she has a ruptured disc at L4-L5 cannot bend over to put on her stockings. 10/6; patient comes in today with no open wounds on either leg. However her edema on the left leg in the upper one third of the lower leg is poorly controlled nonpitting. She says that she could not use the pumps for 2 days and then she has been using the last couple of days. It is not clear to me she has been able to get her stocking on. She has back problems. [name] has severe chronic venous insufficiency with secondary lymphedema. Her venous insufficiency is partially centrally mediated and that she is now post  stent in the left common iliac vein. [facility] Thurner's syndrome/physiology]. She follows up with them on 10/15. She wears 20/30 below-knee stockings. She is supposed to use compression pumps at home although I think her compliance about with this is been less than 100%. I have asked her to use these 3 times a day. Finally I think she has lipodermatosclerosis in the left lower leg with an inverted bottle sign. It is been a major problem controlling the edema in the left leg. The right leg we have had wounds on but not as significant a problem is on the left READMISSION 04/12/2021 [name] is a 75 year old woman we know well in this clinic. She has severe chronic venous insufficiency. 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 I believe she has had bilateral greater saphenous vein ablation in the past as well. She tells me that this wound opened sometime in March. She had a fall and thinks it was initially abrasion. She developed areas she describes as little blisters on the anterior part of her leg and she saw dermatology and was treated for methicillin staph aureus with several rounds of antibiotics. She has been using support stockings on the left leg and says this is the only thing she can get on. Her compression pump use maybe once a day she says if she did not use one  she use the other. She comes in today with incredible swelling in the left leg with a wound on the left posterior calf. She has been using Neosporin to this previously a hydrocolloid. 6/15; patient arrives back for 1 week follow-up.Marland Kitchen Apparently her wrap fell down she did not call us to replace this. He has poor edema control. She only uses her compression pumps once a day Electronic Signature(s) Signed: 04/19/2021 4:10:05 PM By: Linton Ham MD Entered By: Linton Ham on 04/19/2021 15:51:43 Ihde, [name] Child ([id number]) -------------------------------------------------------------------------------- Physical Exam Details Patient Name: [name], [name]. Date of Service: 04/19/2021 2:30 PM Medical Record Number: [id number] Patient Account Number: [id number] Date of Birth/Sex: [birth year] (75 y.o. F) Treating RN: Cornell Barman Primary Care Provider: [name] Other Clinician: Referring Provider: [name] Treating Provider/Extender: Tito Dine in Treatment: 1 Constitutional Patient is hypertensive.. Pulse regular and within target range for patient.Marland Kitchen Respirations regular, non-labored and within target range.. Temperature is normal and within the target range for the patient.Marland Kitchen appears in no distress. Cardiovascular Needle pulses are palpable. Poor edema control. There is no evidence of cellulitis or acute DVT. Notes Wound exam; the area on the left posterior calf under illumination does not have anything" viable surface. Reasonably aggressive debridement which is usual she tolerates poorly. She has no evidence of surrounding infection. Hemostasis with direct pressure Electronic Signature(s) Signed: 04/19/2021 4:10:05 PM By: Linton Ham MD Entered By: Linton Ham on 04/19/2021 15:54:10 [place], [name] Child ([id number]) -------------------------------------------------------------------------------- Physician Orders Details Patient Name: [name], [name]. Date of  Service: 04/19/2021 2:30 PM Medical Record Number: [id number] Patient Account Number: [id number] Date of Birth/Sex: 1946/04/30 (75 y.o. F) Treating RN: Cornell Barman Primary Care Provider: [name] Other Clinician: Referring Provider: [name] Treating Provider/Extender: Tito Dine in Treatment: 1 Verbal / Phone Orders: No Diagnosis Coding Follow-up Appointments Wound #13 Left,Distal,Posterior Lower Leg o Return Appointment in 1 week. o Nurse Visit as needed Bathing/ Shower/ Hygiene o May shower with wound dressing protected with water repellent cover or cast protector. Anesthetic (Use 'Patient Medications' Section for Anesthetic Order  Entry) o Lidocaine applied to wound bed Edema Control - Lymphedema / Segmental Compressive Device / Other o Elevate, Exercise Daily and Avoid Standing for Long Periods of Time. o Elevate legs to the level of the heart and pump ankles as often as possible o Elevate leg(s) parallel to the floor when sitting. o Compression Pump: Use compression pump on left lower extremity for 60 minutes, twice daily. - TWICE DAILY for 1 hour o Compression Pump: Use compression pump on right lower extremity for 60 minutes, twice daily. - TWICE DAILY for 1 hour Wound Treatment Wound #13 - Lower Leg Wound Laterality: Left, Posterior, Distal Cleanser: Soap and Water Discharge Instructions: Gently cleanse wound with antibacterial soap, rinse and pat dry prior to dressing wounds Primary Dressing: Iodosorb 40 (g) Discharge Instructions: Apply IodoSorb to wound bed only as directed. Secondary Dressing: ABD Pad 5x9 (in/in) Discharge Instructions: Cover with ABD pad Compression Wrap: Profore Lite LF 3 Multilayer Compression Bandaging System Discharge Instructions: Apply 3 multi-layer wrap as prescribed. Electronic Signature(s) Signed: 04/19/2021 4:10:05 PM By: Linton Ham MD Signed: 04/19/2021 5:10:04 PM By: [name], [name], RN, CWS, Kim  RN, [name] Entered By: [name], [name], RN, CWS, Kim on 04/19/2021 15:38:57 SAE, HANDRICH ([id number]) -------------------------------------------------------------------------------- Problem List Details Patient Name: YINA, RIVIERE. Date of Service: 04/19/2021 2:30 PM Medical Record Number: [id number] Patient Account Number: [id number] Date of Birth/Sex: [birth year] (75 y.o. F) Treating RN: Cornell Barman Primary Care Provider: [name] Other Clinician: Referring Provider: [name] Treating Provider/Extender: Tito Dine in Treatment: 1 Active Problems ICD-10 Encounter Code Description Active Date MDM Diagnosis I87.332 Chronic venous hypertension (idiopathic) with ulcer and inflammation of 04/12/2021 No Yes left lower extremity I89.0 Lymphedema, not elsewhere classified 04/12/2021 No Yes L97.221 Non-pressure chronic ulcer of left calf limited to breakdown of skin 04/12/2021 No Yes Inactive Problems Resolved Problems Electronic Signature(s) Signed: 04/19/2021 4:10:05 PM By: Linton Ham MD Entered By: Linton Ham on 04/19/2021 15:47:58 [place], [name] Child ([id number]) -------------------------------------------------------------------------------- Progress Note Details Patient Name: ANEYAH, LORTZ. Date of Service: 04/19/2021 2:30 PM Medical Record Number: [id number] Patient Account Number: [id number] Date of Birth/Sex: Oct 06, 1946 (75 y.o. F) Treating RN: Cornell Barman Primary Care Provider: [name] Other Clinician: Referring Provider: [name] Treating Provider/Extender: Tito Dine in Treatment: 1 Subjective History of Present Illness (HPI) Pleasant 75 year old with history of chronic venous insufficiency. No diabetes or peripheral vascular disease. Left ABI 1.29. Questionable history of left lower extremity DVT. She developed a recurrent ulceration on her left lateral calf in December 2015, which she attributes to poor diet and  subsequent lower extremity edema. She underwent endovenous laser ablation of her left greater saphenous vein in 2010. She underwent laser ablation of accessory branch of left GSV in April 2016 by Dr. Kellie Simmering at [facility]. She was previously wearing Unna boots, which she tolerated well. Tolerating 2 layer compression and cadexomer iodine. She returns to clinic for follow-up and is without new complaints. She denies any significant pain at this time. She reports persistent pain with pressure. No claudication or ischemic rest pain. No fever or chills. No drainage. READMISSION 11/13/16; this is a 75 year old woman who is not a diabetic. She is here for a review of a painful area on her left medial lower extremity. I note that she was seen here previously last year for wound I believe to be in the same area. At that time she had undergone previously a left greater saphenous vein ablation by Dr. Kellie Simmering and she had  a ablation of the anterior accessory branch of the left greater saphenous vein in March 2016. Seeing that the wound actually closed over. In reviewing the history with her today the ulcer in this area has been recurrent. She describes a biopsy of this area in 2009 that only showed stasis physiology. 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 Do not see that she has ever had arterial studies in the left leg. She tells me she has a follow-up with Dr. Kellie Simmering in roughly 10 days In any case she developed the reopening of this area roughly a month ago. On the background of this she describes rapidly increasing edema which has responded to Lasix 40 mg and metolazone 2.5 mg as well as the patient's lymph massage. She has been told she has both venous insufficiency and lymphedema but she  cannot tolerate compression stockings 11/28/16; the patient saw Dr. Kellie Simmering recently. Per the patient he did arterial Dopplers in the office that did not show evidence of arterial insufficiency, per the patient he stated "treat this like an ordinary venous ulcer". She also saw her dermatologist Dr. Ronnald Ramp who felt that this was more of a vascular ulcer. In general things are improving although she arrives today with increasing bilateral lower extremity edema with weeping a deeper fluid through the wound on the left medial leg compatible with some degree of lymphedema 12/04/16; the patient's wound is fully epithelialized but I don't think fully healed. We will do another week of depression with Promogran and TCA however I suspect we'll be able to discharge her next week. This is a very unusual-looking wound which was initially a figure-of-eight type wound lying on its side surrounded by petechial like hemorrhage. She has had venous ablation on this side. She apparently does not have an arterial issue per Dr. Kellie Simmering. She saw her dermatologist thought it was "vascular". Patient is definitely going to need ongoing compression and I talked about this with her today she will go to elastic therapy after she leaves here next week 12/11/16; the patient's wound is not completely closed today. She has surrounding scar tissue and in further discussion with the patient it would appear that she had ulcers in this area in 2009 for a prolonged period of time ultimately requiring a punch biopsy of this area that only showed venous insufficiency. I did not previously pickup on this part of the history from the patient. 12/18/16; the patient's wound is completely epithelialized. There is no open area here. She has significant bilateral venous insufficiency with secondary lymphedema to a mild-to-moderate degree she does not have compression stockings.. She did not say anything to me when I was in the room, she told our intake  nurse that she was still having pain in this area. This isn't unusual recurrent small open area. She is going to go to elastic therapy to obtain compression stockings. 12/25/16; the patient's wound is fully epithelialized. There is no open area here. The patient describes some continued episodic discomfort in this area medial left calf. However everything looks fine and healed here. She is been to elastic therapy and caught herself 15-20 mmHg stockings, they apparently were having trouble getting 20-30 mm stockings in her size 01/22/17; this is a patient we discharged from the clinic a  month ago. She has a recurrent open wound on her medial left calf. She had 15 mm support stockings. I told her I thought she needed 20-30 mm compression stockings. She tells me that she has been ill with hospitalization secondary to asthma and is been found to have severe hypokalemia likely secondary to a combination of Lasix and metolazone. This morning she noted blistering and leaking fluid on the posterior part of her left leg. She called our intake nurse urgently and we was saw her this afternoon. She has not had any real discomfort here. I don't know that she's been wearing any stockings on this leg for at least 2-3 days. ABIs in this clinic were 1.21 on the right and 1.3 on the left. She is previously seen vascular surgery who does not think that there is a peripheral arterial issue. 01/30/17; Patient arrives with no open wound on the left leg. She has been to elastic therapy and obtained 20-34mmhg below knee stockings and she has one on the right leg today. READMISSION 02/19/18; this Brownrigg is a now 75 year old patient we've had in this clinic perhaps 3 times before. I had last looked at her from January 07 December 2016 with an area on the medial left leg. We discharged her on 12/25/16 however she had to be readmitted on 01/22/17 with a recurrence. I have in my notes that we discharged her on 20-30 mm stockings  although she tells me she was only wearing support hose because she cannot get stockings on predominantly related to her cervical spine surgery/issues. She has had previous ablations done by vein and vascular in [place] including a great saphenous vein ablation on the left with an anterior accessory branch ablation I think both of these were in 2016. On one of the previous visit she had a biopsy noted 2009 that was negative. She is not felt to have an arterial issue. She is not a diabetic. She does have a history of obstructive sleep apnea hypertension asthma as well as chronic venous insufficiency and lymphedema. On this occasion she noted 2 dry scaly patch on her left leg. She tried to put lotion on this it didn't really help. There were 2 open areas.the patient has been seeing her primary physician from 02/05/18 through 02/14/18. She had Unna boots applied. The superior wound now on the lateral left leg has closed but she's had one wound that remains open on the lateral left leg. This is not the same spot as we dealt with in 2018. ABIs in this clinic were 1.3 bilaterally [name], [name] ([id number]) 02/26/18; patient has a small wound on the left lateral calf. Dimensions are down. She has chronic venous insufficiency and lymphedema. 03/05/18; small open area on the left lateral calf. Dimensions are down. Tightly adherent necrotic debris over the surface of the wound which was difficult to remove. Also the dressing [over collagen] stuck to the wound surface. This was removed with some difficulty as well. Change the primary dressing to Hydrofera Blue ready 03/12/18; small open area on the left lateral calf. Comes in with tightly adherent surface eschar as well as some adherent Hydrofera Blue. 03/19/18; open area on the left lateral calf. Again adherent surface eschar as well as some adherent Hydrofera Blue nonviable subcutaneous tissue. She complained of pain all week even with the reduction from 4-3  layer compression I put on last week. Also she had an increase in her ankle and calf measurements probably related to the same thing. 03/26/18; open  area on the left lateral calf. A very small open area remains here. We used silver alginate starting last week as the Hydrofera Blue seem to stick to the wound bed. In using 4-layer compression 04/02/18; the open area in the left lateral calf at some adherent slough which I removed there is no open area here. We are able to transition her into her own compression stocking. Truthfully I think this is probably his support hose. However this does not maintain skin integrity will be limited. She cannot put over the toe compression stockings on because of neck problems hand problems etc. She is allergic to the lining layer of juxta lites. We might be forced to use extremitease stocking should this fail READMIT 11/24/2018 Patient is now a 75 year old woman who is not a diabetic. She has been in this clinic on at least 3 previous occasions largely with recurrent wounds on her left leg secondary to chronic venous insufficiency with secondary lymphedema. Her situation is complicated by inability to get stockings on and an allergy to neoprene which is apparently a component and at least juxta lites and other stockings. As a result she really has not been wearing any stockings on her legs. She tells [place] that roughly 2 or 3 weeks ago she started noticing a stinging sensation just above her ankle on the left medial aspect. She has been diagnosed with pseudogout and she wondered whether this was what she was experiencing. She tried to dress this with something she bought at the store however subsequently it pulled skin off and now she has an open wound that is not improving. She has been using Vaseline gauze with a cover bandage. She saw her primary doctor last week who put an Haematologist on her. ABIs in this clinic was 1.03 on the left 2/12; the area is on the left  medial ankle. Odd-looking wound with what looks to be surface epithelialization but a multitude of small petechial openings. This clearly not closed yet. We have been using silver alginate under 3 layer compression with TCA 2/19; the wound area did not look quite as good this week. Necrotic debris over the majority of the wound surface which required debridement. She continues to have a multitude of what looked to be small petechial openings. She reminds [place] that she had a biopsy on this initially during her first outbreak in 2015 in [place] [facility]. She expresses concern about this being a possible melanoma. 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 I assured her that this does not look anything like melanoma. Except for the petechial reaction it does look like a venous insufficiency area and she certainly has evidence of this on both sides 2/26; a difficult area on the left medial ankle. The patient clearly has chronic venous hypertension with some degree of lymphedema. The odd thing about the area is the small petechial hemorrhages. I am not really sure how to explain this. This was present last time and this is not a compression injury. We have been using Hydrofera Blue which I changed to last week 3/4; still using Hydrofera Blue. Aggressive debridement today. She does not have known arterial issues. She has seen Dr. Kellie Simmering at [facility] vein and vascular and and has an ablation on the left. [Anterior accessory branch of the greater saphenous]. From what I remember they did not feel she had an arterial issue. The patient has had this area biopsied  in 2009 at [facility] dermatology and by her recollection they said this was "stasis". She is also follow-up with dermatology locally who thought that this was more of a vascular issue 3/11; using Hydrofera Blue. Aggressive debridement today. She does not have  an arterial issue. We are using 3 layer compression although we may need to go to 4. The patient has been in for multiple changes to her wrap since I last saw her a week ago. She says that the area was leaking. I do not have too much more information on what was found 01/19/19 on evaluation today patient was actually being seen for a nurse visit when unfortunately she had the area on her left lateral lower extremity as well as weeping from the right lower extremity that became apparent. Therefore we did end up actually seeing her for a full visit with myself. She is having some pain at this site as well but fortunately nothing too significant at this point. No fevers, chills, nausea, or vomiting noted at this time. 3/18-Patient is back to the clinic with the left leg venous leg ulcer, the ulcer is larger in size, has a surface that is densely adherent with fibrinous tissue, the Hydrofera Blue was used but is densely adherent and there was difficulty in removing it. The right lower extremity was also wrapped for weeping edema. Patient has a new area over the left lateral foot above the malleolus that is small and appears to have no debris with intact surrounding skin. Patient is on increased dose of Lasix also as a means to edema management 3/25; the patient has a nonhealing venous ulcer on the medial left leg and last week developed a smaller area on the lateral left calf. We have been using Hydrofera Blue with a contact layer. 4/1; no major change in these wounds areas. Left medial and more recently left lateral calf. I tried Iodoflex last week to aid in debridement she did not tolerate this. She stated her pain was terrible all week. She took the top layer of the 4 layer compression off. 4/8; the patient actually looks somewhat better in terms of her more prominent left lateral calf wound. There is some healthy looking tissue here. She is still complaining of a lot of discomfort. 4/15; patient in a  lot of pain secondary to sciatica. She is on a prednisone taper prescribed by her primary physician. She has the 2 areas one on the left medial and more recently a smaller area on the left lateral calf. Both of these just above the malleoli 4/22; her back pain is better but she still states she is very uncomfortable and now feels she is intolerant to the [employer]. No real change in the wounds we have been using Sorbact. She has been previously intolerant to Iodoflex. There is not a lot of option about what we can use to debride this wound under compression that she no doubt needs. sHe states Ultram no longer works for her pain 4/29; no major change in the wounds slightly increased depth. Surface on the original medial wound perhaps somewhat improved however the more recent area on the lateral left ankle is 100% covered in very adherent debris we have been using Sorbact. She tolerates 4 layer compression well and her edema control is a lot better. She has not had to come in for a nurse check 5/6; no major change in the condition of the wounds. She did consent to debridement today which was done with some  difficulty. Continuing Sorbact. She did not tolerate Iodoflex. She was in for a check of her compression the day after we wrapped her last week this was adjusted but nothing much was found 5/13; no major change in the condition or area of the wounds. I was able to get a fairly aggressive debridement done on the lateral left leg wound. Even using Sorbact under compression. She came back in on Friday to have the wrap changed. She says she felt uncomfortable on the lateral aspect of her ankle. She has a long history of chronic venous insufficiency including previous ablation surgery on this side. 5/20-Patient returns for wounds on left leg with both wounds covered in slough, with the lateral leg wound larger in size, she has been in 3 layer compression and felt more comfortable, she describes pain in  ankle, in leg and pins and needles in foot, and is about to try Pamelor for this 6/3; wounds on the left lateral and left medial leg. The area medially which is the most recent of the 2 seems to have had the largest increase in [name], [name]. ([id number]) dimensions. We have been using Sorbac to try and debride the surface. She has been to see orthopedics they apparently did a plain x-ray that was indeterminant. Diagnosed her with neuropathy and they have ordered an MRI to determine if there is underlying osteomyelitis. This was not high on my thought list but I suppose it is prudent. We have advised her to make an appointment with vein and vascular in [place]. She has a history of a left greater saphenous and accessory vein ablations I wonder if there is anything else that can be done from a surgical point of view to help in these difficult refractory wounds. We have previously healed this wound on one occasion but it keeps on reopening [medial side] 6/10; deep tissue culture I did last week I think on the left medial wound showed both moderate E. coli and moderate staph aureus [MSSA]. She is going to require antibiotics and I have chosen Augmentin. We have been using Sorbact and we have made better looking wound surface on both sides but certainly no improvement in wound area. She was back in last Friday apparently for a dressing changes the wrap was hurting her outer left ankle. She has not managed to get a hold of vein and vascular in [place]. We are going to have to make her that appointment 6/17; patient is tolerating the Augmentin. She had an MRI that I think was ordered by orthopedic surgeon this did not show osteomyelitis or an abscess did suggest cellulitis. We have been using Sorbact to the lateral and medial ankles. We have been trying to arrange a follow-up appointment with vein and vascular in [place] or did her original ablations. We apparently an area sent the request to vein  and vascular in [facility] 6/24; patient has completed the Augmentin. We do not yet have a vein and vascular appointment in [place]. I am not sure what the issue is here we have asked her to call tomorrow. We are using Sorbact. Making some improvements and especially the medial wound. Both surfaces however look better medial and lateral. 7/1; the patient has been in contact with vein and vascular in [place] but has not yet received an appointment. Using Sorbact we have gradually improve the wound surface with no improvement in surface area. She is approved for Apligraf but the wound surface still is not completely viable. She has not had  to come in for a dressing change 7/8; the patient has an appointment with vein and vascular on 7/31 which is a Friday afternoon. She is concerned about getting back here for [place] to dress her wounds. I think it is important to have them goal for her venous reflux/history of ablations etc. to see if anything else can be done. She apparently tested positive for 1 of the blood tests with regards to lupus and saw a rheumatologist. He has raised the issue of vasculitis again. I have had this thought in the past however the evidence seems overwhelming that this is a venous reflux etiology. If the rheumatologist tells me there is clinical and laboratory investigation is positive for lupus I will rethink this. 7/15; the patient's wound surfaces are quite a bit better. The medial area which was her original wound now has no depth although the lateral wound which was the more recent area actually appears larger. Both with viable surfaces which is indeed better. Using Sorbact. I wanted to use Apligraf on her however there is the issue of the vein and vascular appointment on 7/31 at 2:00 in the afternoon which would not allow her to get back to be rewrapped and they would no doubt remove the graft 7/22; the patient's wound surfaces have moderate amount of debris although  generally look better. The lateral one is larger with 2 small satellite areas superiorly. We are waiting for her vein and vascular appointment on 7/31. She has been approved for Apligraf which I would like to use after th 7/29; wound surfaces have improved no debridement is required we have been using Sorbact. She sees vein and vascular on Friday with this so question of whether anything can be done to lessen the likelihood of recurrence and/or speed the healing of these areas. She is already had previous ablations. She no doubt has severe venous hypertension 8/5-Patient returns at 1 week, she was in [place] for 3 days by her podiatrist, we have been using so backed to the wound, she has increased pain in both the wounds on the left lower leg especially the more distal one on the lateral aspect 8/12-Patient returns at 1 week and she is agreeable to having debridement in both wounds on her left leg today. We have been using Sorbact, and vascular studies were reviewed at last visit 8/19; the patient arrives with her wounds fairly clean and no debridement is required. We have used Sorbact which is really done a nice job in cleaning up these very difficult wound surfaces. The patient saw Dr. Donzetta Matters of vascular surgery on 7/31. He did not feel that there was an arterial component. He felt that her treated greater saphenous vein is adequately addressed and that the small saphenous vein did not appear to be involved significantly. She was also noted to have deep venous reflux which is not treatable. Dr. Donzetta Matters mentioned the possibility of a central obstructive component leading to reflux and he offered her central venography. She wanted to discuss this or think about it. I have urged her to go ahead with this. She has had recurrent difficult wounds in these areas which do heal but after months in the clinic. If there is anything that can be done to reduce the likelihood of this I think it is worth it. 9/2  she is still working towards getting follow-up with Dr. Donzetta Matters to schedule her CT. Things are quite a bit worse venography. I put Apligraf on 2 weeks ago on both  wounds on the medial and lateral part of her left lower leg. She arrives in clinic today with 3 superficial additional wounds above the area laterally and one below the wound medially. She describes a lot of discomfort. I think these are probably wrapped injuries. Does not look like she has cellulitis. 07/20/2019 on evaluation today patient appears to be doing somewhat poorly in regard to her lower extremity ulcers. She in fact showed signs of erythema in fact we may even be dealing with an infection at this time. Unfortunately I am unsure if this is just infection or if indeed there may be some allergic reaction that occurred as a result of the Apligraf application. With that being said that would be unusual but nonetheless not impossible in this patient is one who is unfortunately allergic to quite a bit. Currently we have been using the Sorbact which seems to do as well as anything for her. I do think we may want to obtain a culture today to see if there is anything showing up there that may need to be addressed. 9/16; noted that last week the wounds look worse in 1 week follow-up of the Apligraf. Using Sorbact as of 2 days ago. She arrives with copious amounts of drainage and new skin breakdown on the back of the left calf. The wounds arm more substantial bilaterally. There is a fair amount of swelling in the left calf no overt DVT there is edema present I think in the left greater than right thigh. She is supposed to go on 9/28 for CT venography. The wounds on the medial and lateral calf are worse and she has new skin breakdown posteriorly at least new for me. This is almost developing into a circumferential wound area The Apligraf was taken off last week which I agree with things are not going in the right direction a culture was done we do  not have that back yet. She is on Augmentin that she started 2 days ago 9/23; dressing was changed by her nurses on Monday. In general there is no improvement in the wound areas although the area looks less angry than last week. She did get Augmentin for MSSA cultured on the 14th. She still appears to have too much swelling in the left leg even with 3 layer compression 9/30; the patient underwent her procedure on 9/28 by Dr. Donzetta Matters at [facility]. She was discovered to have the common iliac vein measuring 12.2 mm but at the level of L4-L5 measured 3 mm. After stenting it measured 10 mm. 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 Rouleaux flow in the common femoral and femoral vein was observed much improved after stenting. We are using silver alginate to the wounds on the medial and lateral ankle on the left. 4 layer compression 10/7; the patient had fluid swelling around her knee and 4 layer compression. At the advice of vein and vascular this was reduced to 3 layer which she is tolerating better. We have been using silver alginate under 3 layer compression since last Friday 10/14; arrives with the areas on the left ankle looking a lot better. Inflammation in the area also a lot better. She came in for a nurse check on 10/9 10/21; continued nice improvement. Slight improvements in surface area of both the medial and lateral wounds on the left. A lot of the satellite lesions in the weeping erythema around these from stasis dermatitis is resolved. We have been using silver alginate  [name], [name] ([id number]) 10/28; general improvement in the entire wound areas although not a lot of change in dimensions the wound certainly looks better. There is a lot less in terms of venous inflammation. Continue silver alginate this week however look towards Hydrofera Blue next week 11/4; very adherent debris on the medial wound left wound is not as bad. We have been using silver  alginate. Change to [facility] today 11/11; very adherent debris on both wound areas. She went to vein and vascular last week and follow-up they put in Hardesty boot on this today. He says the [facility] was adherent. Wound is definitely not as good as last week. Especially on the left there the satellite lesions look more prominent 11/18; absolutely no better. erythema on lateral aspect with tenderness. 09/30/2019 on evaluation today patient appears to actually be doing better. Dr. Dellia Nims did put her on doxycycline last week which I do believe has helped her at this point. Fortunately there is no signs of active infection at this time. No fevers, chills, nausea, vomiting, or diarrhea. I do believe he may want extend the doxycycline for 7 additional days just to ensure everything does completely cleared up the patient is in agreement with that plan. Otherwise she is going require some sharp debridement today 12/2; patient is completing a 2-week course of doxycycline. I gave her this empirically for inflammation as well as infection when I last saw her 2 weeks ago. All of this seems to be better. She is using silver alginate she has the area on the medial aspect of the larger area laterally and the 2 small satellite regions laterally above the major wound. 12/9; the patient's wound on the left medial and left lateral calf look really quite good. We have been using silver alginate. She saw vein and vascular in follow-up on 10/09/2019. She has had a previous left greater saphenous vein ablation by Dr. Oscar La in 2016. More recently she underwent a left common iliac vein stent by Dr. 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 The swelling is improved and certainly the wounds have improved. The patient shows [place] today area on the right medial calf there is almost no wound but leaking lymphedema. She says she start this started 3 or 4 days ago. She did not traumatize it. It is not painful. She  does not wear compression on that side 12/16; the patient continues to do well laterally. Medially still requiring debridement. The area on the right calf did not materialize to anything and is not currently open. We wrapped this last time. She has support stockings for that leg although I am not sure they are going to provide adequate compression 12/23; the lateral wound looks stable. Medially still requiring debridement for tightly adherent fibrinous debris. We've been using silver alginate. Surface area not any different 12/30; neither wound is any better with regards to surface and the area on the left lateral is larger. I been using silver alginate to the left lateral which look quite good last week and Sorbact to the left medial 11/11/2019. Lateral wound area actually looks better and somewhat smaller. Medial still requires a very aggressive debridement today. We have been using Sorbact on both wound areas 1/13; not much better still adherent debris bilaterally. I been using Sorbact. She has severe venous hypertension. Probably some degree of dermal fibrosis distally. I wonder whether tighter compression might help and I am going to try that today. We also need to work on  the bioburden 1/20; using Sorbact. She has severe venous hypertension status post stent placement for pelvic vein compression. We applied gentamicin last time to see if we could reduce bioburden I had some discussion with her today about the use of pentoxifylline. This is occasionally used in this setting for wounds with refractory venous insufficiency. However this interacts with Plavix. She tells me that she was put on this after stent placement for 3 months. She will call Dr. Claretha Cooper office to discuss 1/27; we are using gentamicin under Sorbact. 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 She has a stent. Wound medially is measuring smaller this week. Laterally measuring slightly larger although she  has some satellite lesions superiorly 2/3; gentamicin under Sorbact under 4-layer compression. 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 She has a stent on Plavix. Her wounds are measuring smaller this week. More substantially laterally where there is a satellite lesion superiorly. 2/10; gentamicin under Sorbac. 4-layer compression. Patient communicated with Dr. Donzetta Matters at vein and vascular in [place]. He is okay with the patient coming off Plavix I will therefore start her on pentoxifylline for a 1 month trial. In general her wounds look better today. I had some concerns about swelling in the left thigh however she measures 61.5 on the right and 63 on the mid thigh which does not suggest there is any difficulty. The patient is not describing any pain. 2/17; gentamicin under Sorbac 4-layer compression. She has been on pentoxifylline for 1 week and complains of loose stool. No nausea she is eating and drinking well 2/24; the patient apparently came in 2 days ago for a nurse visit when her wrap fell down. Both areas look a little worse this week macerated medially and satellite lesions laterally. Change to silver alginate today 3/3; wounds are larger today especially medially. She also has more swelling in her foot lower leg and I even noted some swelling in her posterior thigh which is tender. I wonder about the patency of her stent. Fortuitously she sees Dr. Claretha Cooper group on Friday 3/10; Mrs. Montanye was seen by vein and vascular on 3/5. The patient underwent ultrasound. There was no evidence of thrombosis involving the IVC no evidence of thrombosis involving the right common iliac vein there is no evidence of thrombosis involving the right external iliac vein the left external vein is also patent. The right common iliac vein stent appears patent bilateral common femoral veins are compressible and appear patent. I was concerned about the left common iliac stent however it  looks like this is functional. She has some edema in the posterior thigh that was tender she still has that this week. I also note they had trouble finding the pulses in her left foot and booked her for an ABI baseline in 4 weeks. She will follow up in 6 months for repeat IVC duplex. The patient stopped the pentoxifylline because of diarrhea. It does not look like that was being effective in any case. I have advised her to go back on her aspirin 81 mg tablet, vascular it also suggested this 3/17; comes in today with her wound surfaces a lot better. The excoriations from last week considerably better probably secondary to the TCA. We have been using silver alginate 3/24; comes in today with smaller wounds both medially and laterally. Both required debridement. There are 2 small satellite areas superiorly laterally. She also has a very odd bandlike area in the mid calf almost looking like there was a weakness  in the wrap in a localized area. I would write this off as being this however anteriorly she has a small raised ballotable area that is very tender almost reminiscent of an abscess but there was no obvious purulent surface to it. 02/04/20 upon evaluation today patient appears to be doing fairly well in regard to her wounds today. Fortunately there is no signs of active infection at this time. No fevers, chills, nausea, vomiting, or diarrhea. She has been tolerating the dressing changes without complication. Fortunately I feel like she is showing signs of improvement although has been sometime since have seen her. Nonetheless the area of concern that Dr. Dellia Nims had last week where she had possibly an area of the wrap that was we can allow the leg to bulge appears to be doing significantly better today there is no signs of anything worsening. MAALIYAH, ADOLPH ([id number]) 4/7; the patient's wounds on her medial and lateral left leg continue to contract. We have been using a regular alginate. Last  week she developed an area on the right medial lower leg which is probably a venous ulcer as well. 4/14; the wounds on her left medial and lateral lower leg continue to contract. Surface eschar. We have been using regular alginate. The area on the right medial lower leg is closed. We have been putting both legs under 4-layer contraction. The patient went back to see vein and vascular she had arterial studies done which were apparently "quite good" per the patient although I have not read their notes I have never felt she had an arterial issue. 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 This is been longstanding and refractory to exercise, leg elevation and longstanding use of compression wraps in our clinic as well as compression stockings on the times we have been able to get these to heal 4/21; we thought she actually might be close this week however she arrives in clinic with a lot of edema in her upper left calf and into her posterior thigh. This is been an intermittent problem here. She says the wrap fell down but it was replaced with a nurse visit on Monday. We are using calcium alginate to the wounds and the wound sizes there not terribly larger than last week but there is a lot more edema 4/28; again wound edges are smaller on both sides. Her edema is better controlled than last time. She is obtained her compression pumps from [employer] although they have not been to her home to set these up. 5/5; left medial and left lateral both look stable. I am not sure the medial is any smaller. We have been using calcium alginate under 4-layer compression. She had an area on the right medial. This was eschared today. We have been wrapping this as well. She does not tolerate external compression stockings due to a history of various contact allergies. She has her compression pumps however the representative from the company is coming on her to show  her how to use these tomorrow 5/19; patient with severe chronic venous insufficiency secondary to central venous disease. She had a stent placed in her left common iliac vein. She has done better since but still difficult to control wounds. She comes in today with nothing open on the right leg. Her areas on the left medial and left lateral are just about closed. We are using calcium alginate under 4-layer compression. She is using her external compression pumps at home She only has 15-20  support stockings. States she cannot get anything tighter than that on. 03/30/20-Patient returns at 1 week, the wounds on the left leg are both slightly bigger, the last week she was on 3 layer compression which started to slide down. She is starting to use her lymphedema pumps although she stated on 1 day her right ankle started to swell up and she have to stop that day. Unfortunately the open area seem to oscillate between improving to the point of healing and then flaring up all to do with effectiveness of compression or lack of due to the left leg topography not keeping the compression wraps from rolling down 6/2 patient comes in with a 15/20 mmHg stocking on the right leg. She tells me that she developed a lot of swelling in her ankles she saw orthopedics she was felt to possibly be having a flare of pseudogout versus some other type of arthritis. She was put on steroids for a respiratory issue so that helps with the inflammation. She has not been using the pumps all week. She thinks the left thigh is more swollen than usual and I would agree with that. She has an appointment with Dr. Donzetta Matters 9 days or so from now 6/9; both wounds on the left medial and left lateral are smaller. We have been using calcium alginate under compression. She does not have an open wound on the right leg she is using a stocking and her compression pumps things are going well. She has an appointment with Dr. Donzetta Matters with regards to her stent  in the left common iliac vein 6/16; the wounds on the left medial and left lateral ankle continues to contract. The patient saw Dr. Donzetta Matters and I think he seems satisfied. Ordered follow-up venous reflux studies on both sides in September. Cautioned that she may need thigh-high stockings. She has been using calcium alginate under compression on the left and her own stocking on the right leg. She tells [place] there are no open wounds on the right 6/23; left lateral is just about closed. Medial required debridement today. We have been using calcium alginate. Extensive discussion about the compression pumps she is only using these on 25 mmHg states she could not take 40 or 30 when the wrap came out to her home to demonstrate these. He said they should not feel tight 6/30; the left lateral wound has a slight amount of eschar. . The area medially is about the same using Hydrofera Blue. 7/7; left lateral wound still has some eschar. I will remove this next week may be closed. The area medially is very small using Hydrofera Blue with improvement. Unfortunately the stockings fell down. Unfortunately the blisters have developed at the edge of where the wrap fell. When this happened she says her legs hurt she did not use her pumps. We are not open Monday for her to come in and change the wraps and she had an appointment yesterday. She also tells me that she is going to have an MRI of her back. She is having pain radiating into her left anterior leg she thinks her from an L5 disc. She saw Dr. Ellene Route of neurosurgery 7/14; the area on the left lateral ankle area is closed. Still a small area medially however it looks better as well. We have been using Hydrofera Blue under 4-layer compression 7/21; left lateral ankle is still closed however her wound on the medial left calf is actually larger. This is probably because Hydrofera Blue got stuck to the  wound. She came in for a nurse change on Friday and will do that again  this week I was concerned about the amount of swelling that she had last week however she is using her compression pumps twice a day and the swelling seems well controlled 7/28; remaining wound on the left medial lower leg is smaller. We have been using moistened silver collagen under compression she is coming back for a nurse visit. For reasons that were not really clear she was just keeping her legs elevated and not using her compression pumps. I have asked her to use the compression pumps. She does not have any wounds on the right leg 06/15/20-Patient returns at 2 weeks, her LLE edema is worse and she developed a blister wound that is new and has bigger posterior calf wound on right, we are using Prisma with pad, 4 layer compression. she has been on lasix 40 mg daily 8/18; patient arrives today with things a lot worse than I remember from a few weeks ago. She was seen last week. Noted that her edema was worse and that she now had a left lateral wound as well as deteriorating edema in the medial and posterior part of the lower leg. She says she is using his or her external compression pumps once a day although I wonder about the compliance. 8/25; weeping area on the right medial lower leg. This had actually gotten a small localized area of her compression stocking wet. On the left side there is a large denuded area on the posterior medial lower leg and smaller area on the lateral. This was not the original areas that we dealt with. 9/1 the patient's wound on the left leg include the left lateral and left posterior. Larger superficial wounds weeping. She has very poor edema control. Tender localized edema in the left lower medial ankle/heel probably because of localized wrap issues. She freely admits she is not using the compression pumps. She has been up on her feet a lot. She thinks the hydrofera blue is contributing to the pain she is experiencing.. This is a complaint that I have occasionally  heard 9/8; really not much improvement. The patient is still complaining of a lot of pain particularly when she uses compression pumps. I switched her to silver alginate last time because she found the Hydrofera Blue to be irritating. I don't hear much difference in her description with the silver alginate. 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 She has follow-up with Dr. Donzetta Matters I think for ultrasound next month MARITZA, HOSTERMAN ([id number]) 9/15; quite a bit of improvement today. We have less edema and more epithelialization in both of her wound areas on the left medial and left lateral calf. These are not the site of her original wounds in this area. She says she has been using her compression pumps for 30 minutes twice a day, there pain issues that never quite understood. Silver alginate as the primary dressing 9/22; continued improvement. Both areas medially and laterally still have a small open area there is some eschar. She continues to complain of left medial ankle pain. Swelling in the leg is in much better condition. We have been using silver alginate 9/29; continued improvement. Both areas medially and laterally in the left calf look as though they are close some minor surface eschar but I think this is epithelialized. She comes in today saying she has  a ruptured disc at L4-L5 cannot bend over to put on her stockings. 10/6; patient comes in today with no open wounds on either leg. However her edema on the left leg in the upper one third of the lower leg is poorly controlled nonpitting. She says that she could not use the pumps for 2 days and then she has been using the last couple of days. It is not clear to me she has been able to get her stocking on. She has back problems. [name] has severe chronic venous insufficiency with secondary lymphedema. Her venous insufficiency is partially centrally mediated and  that she is now post stent in the left common iliac vein. [facility] Thurner's syndrome/physiology]. She follows up with them on 10/15. She wears 20/30 below-knee stockings. She is supposed to use compression pumps at home although I think her compliance about with this is been less than 100%. I have asked her to use these 3 times a day. Finally I think she has lipodermatosclerosis in the left lower leg with an inverted bottle sign. It is been a major problem controlling the edema in the left leg. The right leg we have had wounds on but not as significant a problem is on the left READMISSION 04/12/2021 [name] is a 75 year old woman we know well in this clinic. She has severe chronic venous insufficiency. 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 I believe she has had bilateral greater saphenous vein ablation in the past as well. She tells me that this wound opened sometime in March. She had a fall and thinks it was initially abrasion. She developed areas she describes as little blisters on the anterior part of her leg and she saw dermatology and was treated for methicillin staph aureus with several rounds of antibiotics. She has been using support stockings on the left leg and says this is the only thing she can get on. Her compression pump use maybe once a day she says if she did not use one she use the other. She comes in today with incredible swelling in the left leg with a wound on the left posterior calf. She has been using Neosporin to this previously a hydrocolloid. 6/15; patient arrives back for 1 week follow-up.Marland Kitchen Apparently her wrap fell down she did not call us to replace this. He has poor edema control. She only uses her compression pumps once a day Objective Constitutional Patient is hypertensive.. Pulse regular and within target range for patient.Marland Kitchen Respirations regular, non-labored and within target range.. Temperature is normal and within the  target range for the patient.Marland Kitchen appears in no distress. Vitals Time Taken: 2:56 AM, Height: 63 in, Weight: 212 lbs, BMI: 37.6, Temperature: 98.8 F, Pulse: 67 bpm, Respiratory Rate: 18 breaths/min, Blood Pressure: 159/81 mmHg. Cardiovascular Needle pulses are palpable. Poor edema control. There is no evidence of cellulitis or acute DVT. General Notes: Wound exam; the area on the left posterior calf under illumination does not have anything" viable surface. Reasonably aggressive debridement which is usual she tolerates poorly. She has no evidence of surrounding infection. Hemostasis with direct pressure Integumentary (Hair, Skin) Wound #13 status is Open. Original cause of wound was Gradually Appeared. The date acquired was: 02/24/2021. The wound has been in treatment 1 weeks. The wound is located on the Left,Distal,Posterior Lower Leg. The wound measures 2.7cm length x 2.3cm width x 0.1cm depth; 4.877cm^2 area and 0.488cm^3 volume. There is Fat Layer (Subcutaneous Tissue) exposed. There  is no tunneling or undermining noted. There is a medium amount of serosanguineous drainage noted. There is medium (34-66%) red, hyper - granulation within the wound bed. There is a medium (34-66%) amount of necrotic tissue within the wound bed including Adherent Slough. Assessment Active Problems ICD-10 Chronic venous hypertension (idiopathic) with ulcer and inflammation of left lower extremity Lymphedema, not elsewhere classified Non-pressure chronic ulcer of left calf limited to breakdown of skin VELVA, MOLINARI. ([id number]) Procedures Wound #13 Pre-procedure diagnosis of Wound #13 is a Venous Leg Ulcer located on the Left,Distal,Posterior Lower Leg .Severity of Tissue Pre Debridement is: Fat layer exposed. There was a Excisional Skin/Subcutaneous Tissue Debridement with a total area of 6.21 sq cm performed by [name] Dillon, MD. With the following instrument(s): Curette to remove Viable and Non-Viable  tissue/material. Material removed includes Subcutaneous Tissue, Slough, and Biofilm. No specimens were taken. A time out was conducted at 15:36, prior to the start of the procedure. A Moderate amount of bleeding was controlled with Pressure. The procedure was tolerated well. Post Debridement Measurements: 2.7cm length x 2.3cm width x 0.1cm depth; 0.488cm^3 volume. Character of Wound/Ulcer Post Debridement requires further debridement. Severity of Tissue Post Debridement is: Fat layer exposed. Post procedure Diagnosis Wound #13: Same as Pre-Procedure Plan Follow-up Appointments: Wound #13 Left,Distal,Posterior Lower Leg: Return Appointment in 1 week. Nurse Visit as needed Bathing/ Shower/ Hygiene: May shower with wound dressing protected with water repellent cover or cast protector. Anesthetic (Use 'Patient Medications' Section for Anesthetic Order Entry): Lidocaine applied to wound bed Edema Control - Lymphedema / Segmental Compressive Device / Other: Elevate, Exercise Daily and Avoid Standing for Long Periods of Time. Elevate legs to the level of the heart and pump ankles as often as possible Elevate leg(s) parallel to the floor when sitting. Compression Pump: Use compression pump on left lower extremity for 60 minutes, twice daily. - TWICE DAILY for 1 hour Compression Pump: Use compression pump on right lower extremity for 60 minutes, twice daily. - TWICE DAILY for 1 hour WOUND #13: - Lower Leg Wound Laterality: Left, Posterior, Distal Cleanser: Soap and Water Discharge Instructions: Gently cleanse wound with antibacterial soap, rinse and pat dry prior to dressing wounds Primary Dressing: Iodosorb 40 (g) Discharge Instructions: Apply IodoSorb to wound bed only as directed. Secondary Dressing: ABD Pad 5x9 (in/in) Discharge Instructions: Cover with ABD pad Compression Wrap: Profore Lite LF 3 Multilayer Compression Bandaging System Discharge Instructions: Apply 3 multi-layer wrap as  prescribed. 1. I continued with Iodoflex as the primary dressing 2. ABDs under 3 layer compression 3. I again impressed on her the need to have more than 1 use of her external compression pumps daily at home I have asked her for 2 and I will ask her about this every week Electronic Signature(s) Signed: 04/19/2021 4:10:05 PM By: Linton Ham MD Entered By: Linton Ham on 04/19/2021 15:55:04 Bergfeld, [name] Child ([id number]) -------------------------------------------------------------------------------- SuperBill Details Patient Name: MARGURITE, DUFFY. Date of Service: 04/19/2021 Medical Record Number: [id number] Patient Account Number: [id number] Date of Birth/Sex: 12-13-1945 (75 y.o. F) Treating RN: Cornell Barman Primary Care Provider: [name] Other Clinician: Referring Provider: [name] Treating Provider/Extender: Tito Dine in Treatment: 1 Diagnosis Coding ICD-10 Codes Code Description [phone] Chronic venous hypertension (idiopathic) with ulcer and inflammation of left lower extremity I89.0 Lymphedema, not elsewhere classified L97.221 Non-pressure chronic ulcer of left calf limited to breakdown of skin Facility Procedures CPT4 Code Description: 76160737 11042 - DEB SUBQ TISSUE 20 SQ CM/< Modifier: Quantity:  1 CPT4 Code Description: ICD-10 Diagnosis Description L97.221 Non-pressure chronic ulcer of left calf limited to breakdown of skin I87.332 Chronic venous hypertension (idiopathic) with ulcer and inflammation of l I89.0 Lymphedema, not elsewhere classified Modifier: eft lower extremity Quantity: Physician Procedures CPT4 Code Description: [id number] 11042 - WC PHYS SUBQ TISS 20 SQ CM Modifier: Quantity: 1 CPT4 Code Description: ICD-10 Diagnosis Description L97.221 Non-pressure chronic ulcer of left calf limited to breakdown of skin I87.332 Chronic venous hypertension (idiopathic) with ulcer and inflammation of l I89.0 Lymphedema, not elsewhere  classified Modifier: eft lower extremity Quantity: Electronic Signature(s) Signed: 04/19/2021 4:10:05 PM By: Linton Ham MD Entered By: Linton Ham on 04/19/2021 15:55:25

## 2021-04-26 ENCOUNTER — Ambulatory Visit: Payer: Medicare Other | Admitting: Internal Medicine

## 2021-04-27 ENCOUNTER — Other Ambulatory Visit: Payer: Self-pay

## 2021-04-27 DIAGNOSIS — I87332 Chronic venous hypertension (idiopathic) with ulcer and inflammation of left lower extremity: Secondary | ICD-10-CM | POA: Diagnosis not present

## 2021-04-27 NOTE — Progress Notes (Signed)
Amber, [name] ([id number]) Visit Report for 04/27/2021 Arrival Information Details Patient Name: [name], [name]. Date of Service: 04/27/2021 12:30 PM Medical Record Number: [id number] Patient Account Number: [id number] Date of Birth/Sex: Jun 24, 1946 (75 y.o. F) Treating RN: Donnamarie Poag Primary Care Abdirahman Chittum: [name] Other Clinician: Referring Massimiliano Rohleder: [name] Treating Ahlia Lemanski/Extender: [name] in Treatment: 2 Visit Information History Since Last Visit Added or deleted any medications: No Patient Arrived: Ambulatory Had a fall or experienced change in No Arrival Time: 12:36 activities of daily living that may affect Accompanied By: self risk of falls: Transfer Assistance: None Hospitalized since last visit: No Patient Identification Verified: Yes Has Dressing in Place as Prescribed: Yes Secondary Verification Process Completed: Yes Has Compression in Place as Prescribed: Yes Patient Has Alerts: Yes Pain Present Now: No Patient Alerts: Patient on Blood Thinner ASPIRIN NOT diabetic Electronic Signature(s) Signed: 04/27/2021 2:03:19 PM By: Donnamarie Poag Entered By: Donnamarie Poag on 04/27/2021 12:54:40 Date, [name] ([id number]) -------------------------------------------------------------------------------- Clinic Level of Care Assessment Details Patient Name: Amber, [name]. Date of Service: 04/27/2021 12:30 PM Medical Record Number: [id number] Patient Account Number: [id number] Date of Birth/Sex: [birth year] (74 y.o. F) Treating RN: Donnamarie Poag Primary Care Azizah Lisle: [name] Other Clinician: Referring Rache Klimaszewski: [name] Treating Corie Allis/Extender: [name] in Treatment: 2 Clinic Level of Care Assessment Items TOOL 1 Quantity Score []  - Use when EandM and Procedure is performed on INITIAL visit 0 ASSESSMENTS - Nursing Assessment / Reassessment []  - General Physical Exam (combine w/ comprehensive assessment (listed just  below) when performed on new 0 pt. evals) []  - 0 Comprehensive Assessment (HX, ROS, Risk Assessments, Wounds Hx, etc.) ASSESSMENTS - Wound and Skin Assessment / Reassessment []  - Dermatologic / Skin Assessment (not related to wound area) 0 ASSESSMENTS - Ostomy and/or Continence Assessment and Care []  - Incontinence Assessment and Management 0 []  - 0 Ostomy Care Assessment and Management (repouching, etc.) PROCESS - Coordination of Care []  - Simple Patient / Family Education for ongoing care 0 []  - 0 Complex (extensive) Patient / Family Education for ongoing care []  - 0 Staff obtains Programmer, systems, Records, Test Results / Process Orders []  - 0 Staff telephones HHA, Nursing Homes / Clarify orders / etc []  - 0 Routine Transfer to another Facility (non-emergent condition) []  - 0 Routine Hospital Admission (non-emergent condition) []  - 0 New Admissions / Biomedical engineer / Ordering NPWT, Apligraf, etc. []  - 0 Emergency Hospital Admission (emergent condition) PROCESS - Special Needs []  - Pediatric / Minor Patient Management 0 []  - 0 Isolation Patient Management []  - 0 Hearing / Language / Visual special needs []  - 0 Assessment of Community assistance (transportation, D/C planning, etc.) []  - 0 Additional assistance / Altered mentation []  - 0 Support Surface(s) Assessment (bed, cushion, seat, etc.) INTERVENTIONS - Miscellaneous []  - External ear exam 0 []  - 0 Patient Transfer (multiple staff / Civil Service fast streamer / Similar devices) []  - 0 Simple Staple / Suture removal (25 or less) []  - 0 Complex Staple / Suture removal (26 or more) []  - 0 Hypo/Hyperglycemic Management (do not check if billed separately) []  - 0 Ankle / Brachial Index (ABI) - do not check if billed separately Has the patient been seen at the hospital within the last three years: Yes Total Score: 0 Level Of Care: ____ [name] ([id number]) Electronic Signature(s) Signed: 04/27/2021 2:03:19 PM By:  Donnamarie Poag Entered By: Donnamarie Poag on 04/27/2021 12:56:03 Macmillan, [name] ([id number]) -------------------------------------------------------------------------------- Compression Therapy Details Patient Name:  [name], [name] Date of Service: 04/27/2021 12:30 PM Medical Record Number: [id number] Patient Account Number: [id number] Date of Birth/Sex: November 08, 1945 (75 y.o. F) Treating RN: Donnamarie Poag Primary Care Ivannah Zody: [name] Other Clinician: Referring Tareka Jhaveri: [name] Treating Gustaf Mccarter/Extender: [name] in Treatment: 2 Compression Therapy Performed for Wound Assessment: Wound #13 Left,Distal,Posterior Lower Leg Performed By: Clinician Donnamarie Poag, RN Compression Type: Three Layer Electronic Signature(s) Signed: 04/27/2021 2:03:19 PM By: Donnamarie Poag Entered By: Donnamarie Poag on 04/27/2021 12:55:17 Kopplin, [name] ([id number]) -------------------------------------------------------------------------------- Encounter Discharge Information Details Patient Name: [name], Amber. Date of Service: 04/27/2021 12:30 PM Medical Record Number: [id number] Patient Account Number: [id number] Date of Birth/Sex: 1945-12-17 (75 y.o. F) Treating RN: Donnamarie Poag Primary Care Fatin Bachicha: [name] Other Clinician: Referring Andree Golphin: [name] Treating Tonilynn Bieker/Extender: [name] in Treatment: 2 Encounter Discharge Information Items Discharge Condition: Stable Ambulatory Status: Ambulatory Discharge Destination: Home Transportation: Private Auto Accompanied By: self Schedule Follow-up Appointment: Yes Clinical Summary of Care: Electronic Signature(s) Signed: 04/27/2021 2:03:19 PM By: Donnamarie Poag Entered By: Donnamarie Poag on 04/27/2021 12:55:56 Cecena, [name] ([id number]) -------------------------------------------------------------------------------- Wound Assessment Details Patient Name: [name], Amber. Date of Service: 04/27/2021 12:30  PM Medical Record Number: [id number] Patient Account Number: [id number] Date of Birth/Sex: December 07, 1945 (75 y.o. F) Treating RN: Donnamarie Poag Primary Care Aiyana Stegmann: [name] Other Clinician: Referring Cale Decarolis: [name] Treating Shanedra Lave/Extender: Jeri Cos Weeks in Treatment: 2 Wound Status Wound Number: 13 Primary Venous Leg Ulcer Etiology: Wound Location: Left, Distal, Posterior Lower Leg Wound Open Wounding Event: Gradually Appeared Status: Date Acquired: 02/24/2021 Comorbid Cataracts, Asthma, Sleep Apnea, Deep Vein Thrombosis, Weeks Of Treatment: 2 History: Hypertension, Peripheral Venous Disease, Osteoarthritis, Clustered Wound: No Received Chemotherapy, Received Radiation Wound Measurements Length: (cm) 2.7 Width: (cm) 2.3 Depth: (cm) 0.1 Area: (cm) 4.877 Volume: (cm) 0.488 % Reduction in Area: 17.2% % Reduction in Volume: 17.1% Wound Description Classification: Full Thickness Without Exposed Support Structu Exudate Amount: Medium Exudate Type: Serosanguineous Exudate Color: red, brown res Foul Odor After Cleansing: No Slough/Fibrino Yes Wound Bed Granulation Amount: Medium (34-66%) Exposed Structure Granulation Quality: Red, Hyper-granulation Fascia Exposed: No Necrotic Amount: Medium (34-66%) Fat Layer (Subcutaneous Tissue) Exposed: Yes Necrotic Quality: Adherent Slough Tendon Exposed: No Muscle Exposed: No Joint Exposed: No Bone Exposed: No Treatment Notes Wound #13 (Lower Leg) Wound Laterality: Left, Posterior, Distal Cleanser Soap and Water Discharge Instruction: Gently cleanse wound with antibacterial soap, rinse and pat dry prior to dressing wounds Peri-Wound Care Topical Primary Dressing Iodosorb 40 (g) Discharge Instruction: Apply IodoSorb to wound bed only as directed. Secondary Dressing ABD Pad 5x9 (in/in) Discharge Instruction: Cover with ABD pad Secured With Compression Wrap Profore Lite LF 3 Multilayer Compression  [place], [place] ([id number]) Discharge Instruction: Apply 3 multi-layer wrap as prescribed. Compression Stockings Add-Ons Electronic Signature(s) Signed: 04/27/2021 2:03:19 PM By: Donnamarie Poag Entered ByDonnamarie Poag on 04/27/2021 12:54:59

## 2021-05-02 ENCOUNTER — Other Ambulatory Visit: Payer: Self-pay | Admitting: Family Medicine

## 2021-05-03 ENCOUNTER — Encounter (HOSPITAL_BASED_OUTPATIENT_CLINIC_OR_DEPARTMENT_OTHER): Payer: Medicare Other | Admitting: Internal Medicine

## 2021-05-03 ENCOUNTER — Other Ambulatory Visit: Payer: Self-pay | Admitting: Family Medicine

## 2021-05-03 ENCOUNTER — Other Ambulatory Visit: Payer: Self-pay

## 2021-05-03 DIAGNOSIS — L97221 Non-pressure chronic ulcer of left calf limited to breakdown of skin: Secondary | ICD-10-CM

## 2021-05-03 DIAGNOSIS — I87332 Chronic venous hypertension (idiopathic) with ulcer and inflammation of left lower extremity: Secondary | ICD-10-CM | POA: Diagnosis not present

## 2021-05-03 DIAGNOSIS — I890 Lymphedema, not elsewhere classified: Secondary | ICD-10-CM

## 2021-05-03 NOTE — Telephone Encounter (Signed)
Name of Medication: Tramadol Name of Pharmacy: [facility] or Written Date and Quantity: 12/12/20, #15 Last Office Visit and Type: 03/24/21, L LE edema Next Office Visit and Type: 05/09/21, R ankle edema Last Controlled Substance Agreement Date: none Last UDS: none

## 2021-05-05 ENCOUNTER — Ambulatory Visit: Payer: 59 | Admitting: Hematology & Oncology

## 2021-05-05 ENCOUNTER — Other Ambulatory Visit: Payer: 59

## 2021-05-05 NOTE — Telephone Encounter (Signed)
ERx 

## 2021-05-09 ENCOUNTER — Ambulatory Visit: Payer: Medicare Other | Admitting: Family Medicine

## 2021-05-09 ENCOUNTER — Ambulatory Visit: Payer: 59 | Admitting: Hematology & Oncology

## 2021-05-09 ENCOUNTER — Other Ambulatory Visit: Payer: 59

## 2021-05-10 ENCOUNTER — Encounter: Payer: Medicare Other | Attending: Internal Medicine | Admitting: Internal Medicine

## 2021-05-10 ENCOUNTER — Other Ambulatory Visit: Payer: Self-pay

## 2021-05-10 DIAGNOSIS — I87332 Chronic venous hypertension (idiopathic) with ulcer and inflammation of left lower extremity: Secondary | ICD-10-CM | POA: Diagnosis not present

## 2021-05-10 DIAGNOSIS — L97221 Non-pressure chronic ulcer of left calf limited to breakdown of skin: Secondary | ICD-10-CM | POA: Diagnosis not present

## 2021-05-10 DIAGNOSIS — I890 Lymphedema, not elsewhere classified: Secondary | ICD-10-CM | POA: Insufficient documentation

## 2021-05-10 DIAGNOSIS — Z923 Personal history of irradiation: Secondary | ICD-10-CM | POA: Insufficient documentation

## 2021-05-10 DIAGNOSIS — Z87891 Personal history of nicotine dependence: Secondary | ICD-10-CM | POA: Insufficient documentation

## 2021-05-10 DIAGNOSIS — Z85820 Personal history of malignant melanoma of skin: Secondary | ICD-10-CM | POA: Diagnosis not present

## 2021-05-10 DIAGNOSIS — Z86718 Personal history of other venous thrombosis and embolism: Secondary | ICD-10-CM | POA: Diagnosis not present

## 2021-05-12 NOTE — Progress Notes (Signed)
MIMA, CRANMORE ([id number]) Visit Report for 05/10/2021 Arrival Information Details Patient Name: [name], [name]. Date of Service: 05/10/2021 10:00 AM Medical Record Number: [id number] Patient Account Number: [id number] Date of Birth/Sex: June 08, 1946 (75 y.o. F) Treating RN: Carlene Coria Primary Care Priyanka Causey: [name] Other Clinician: Referring Boden Stucky: [name] Treating Nakyiah Kuck/Extender: Yaakov Guthrie in Treatment: 4 Visit Information History Since Last Visit All ordered tests and consults were completed: No Patient Arrived: Ambulatory Added or deleted any medications: No Arrival Time: 10:08 Any new allergies or adverse reactions: No Accompanied By: self Had a fall or experienced change in No Transfer Assistance: None activities of daily living that may affect Patient Identification Verified: Yes risk of falls: Secondary Verification Process Completed: Yes Signs or symptoms of abuse/neglect since last visito No Patient Requires Transmission-Based No Hospitalized since last visit: No Precautions: Implantable device outside of the clinic excluding No Patient Has Alerts: Yes cellular tissue based products placed in the center Patient Alerts: Patient on Blood since last visit: Thinner Has Dressing in Place as Prescribed: Yes ASPIRIN Has Compression in Place as Prescribed: Yes NOT diabetic Pain Present Now: No Electronic Signature(s) Signed: 05/11/2021 10:01:55 PM By: Carlene Coria RN Entered By: Carlene Coria on 05/10/2021 10:13:22 Othman, [name] ([id number]) -------------------------------------------------------------------------------- Clinic Level of Care Assessment Details Patient Name: [name]. Date of Service: 05/10/2021 10:00 AM Medical Record Number: [id number] Patient Account Number: [id number] Date of Birth/Sex: Oct 30, 1946 (76 y.o. F) Treating RN: Carlene Coria Primary Care Shamyah Stantz: [name] Other Clinician: Referring  Karime Scheuermann: [name] Treating Sutton Plake/Extender: Yaakov Guthrie in Treatment: 4 Clinic Level of Care Assessment Items TOOL 1 Quantity Score []  - Use when EandM and Procedure is performed on INITIAL visit 0 ASSESSMENTS - Nursing Assessment / Reassessment []  - General Physical Exam (combine w/ comprehensive assessment (listed just below) when performed on new 0 pt. evals) []  - 0 Comprehensive Assessment (HX, ROS, Risk Assessments, Wounds Hx, etc.) ASSESSMENTS - Wound and Skin Assessment / Reassessment []  - Dermatologic / Skin Assessment (not related to wound area) 0 ASSESSMENTS - Ostomy and/or Continence Assessment and Care []  - Incontinence Assessment and Management 0 []  - 0 Ostomy Care Assessment and Management (repouching, etc.) PROCESS - Coordination of Care []  - Simple Patient / Family Education for ongoing care 0 []  - 0 Complex (extensive) Patient / Family Education for ongoing care []  - 0 Staff obtains Programmer, systems, Records, Test Results / Process Orders []  - 0 Staff telephones HHA, Nursing Homes / Clarify orders / etc []  - 0 Routine Transfer to another Facility (non-emergent condition) []  - 0 Routine Hospital Admission (non-emergent condition) []  - 0 New Admissions / Biomedical engineer / Ordering NPWT, Apligraf, etc. []  - 0 Emergency Hospital Admission (emergent condition) PROCESS - Special Needs []  - Pediatric / Minor Patient Management 0 []  - 0 Isolation Patient Management []  - 0 Hearing / Language / Visual special needs []  - 0 Assessment of Community assistance (transportation, D/C planning, etc.) []  - 0 Additional assistance / Altered mentation []  - 0 Support Surface(s) Assessment (bed, cushion, seat, etc.) INTERVENTIONS - Miscellaneous []  - External ear exam 0 []  - 0 Patient Transfer (multiple staff / Civil Service fast streamer / Similar devices) []  - 0 Simple Staple / Suture removal (25 or less) []  - 0 Complex Staple / Suture removal (26 or more) []   - 0 Hypo/Hyperglycemic Management (do not check if billed separately) []  - 0 Ankle / Brachial Index (ABI) - do not check if billed separately Has the  patient been seen at the hospital within the last three years: Yes Total Score: 0 Level Of Care: ____ [name] ([id number]) Electronic Signature(s) Signed: 05/11/2021 10:01:55 PM By: Carlene Coria RN Entered By: Carlene Coria on 05/10/2021 11:02:33 BROOKIE, WAYMENT ([id number]) -------------------------------------------------------------------------------- Encounter Discharge Information Details Patient Name: [name]. Date of Service: 05/10/2021 10:00 AM Medical Record Number: [id number] Patient Account Number: [id number] Date of Birth/Sex: [birth year] (75 y.o. F) Treating RN: Carlene Coria Primary Care Tineshia Becraft: [name] Other Clinician: Referring Juliana Boling: [name] Treating Deangelo Berns/Extender: Yaakov Guthrie in Treatment: 4 Encounter Discharge Information Items Post Procedure Vitals Discharge Condition: Stable Temperature (F): 98.8 Ambulatory Status: Ambulatory Pulse (bpm): 92 Discharge Destination: Home Respiratory Rate (breaths/min): 18 Transportation: Private Auto Blood Pressure (mmHg): 171/84 Accompanied By: self Schedule Follow-up Appointment: Yes Clinical Summary of Care: Patient Declined Electronic Signature(s) Signed: 05/11/2021 10:01:55 PM By: Carlene Coria RN Entered By: Carlene Coria on 05/10/2021 11:04:20 Bourcier, [name] ([id number]) -------------------------------------------------------------------------------- Lower Extremity Assessment Details Patient Name: [name]. Date of Service: 05/10/2021 10:00 AM Medical Record Number: [id number] Patient Account Number: [id number] Date of Birth/Sex: 1946/04/16 (75 y.o. F) Treating RN: Carlene Coria Primary Care Lindora Alviar: [name] Other Clinician: Referring Jenene Kauffmann: [name] Treating Lesslie Mossa/Extender: Yaakov Guthrie in Treatment: 4 Edema Assessment Assessed: [Left: No] [Right: No] Edema: [Left: Ye] [Right: s] Calf Left: Right: Point of Measurement: 32 cm From Medial Instep 52 cm Ankle Left: Right: Point of Measurement: 10 cm From Medial Instep 23 cm Vascular Assessment Pulses: Dorsalis Pedis Palpable: [Left:Yes] Electronic Signature(s) Signed: 05/11/2021 10:01:55 PM By: Carlene Coria RN Entered By: Carlene Coria on 05/10/2021 10:21:09 [name] ([id number]) -------------------------------------------------------------------------------- Multi Wound Chart Details Patient Name: OMA, MARZAN. Date of Service: 05/10/2021 10:00 AM Medical Record Number: [id number] Patient Account Number: [id number] Date of Birth/Sex: [birth year] (75 y.o. F) Treating RN: Carlene Coria Primary Care Kierstan Auer: [name] Other Clinician: Referring Miran Kautzman: [name] Treating Arah Aro/Extender: Yaakov Guthrie in Treatment: 4 Vital Signs Height(in): 75 Pulse(bpm): 55 Weight(lbs): 212 Blood Pressure(mmHg): 171/84 Body Mass Index(BMI): 38 Temperature(F): 98.8 Respiratory Rate(breaths/min): 18 Photos: [N/A:N/A] Wound Location: Left, Distal, Posterior Lower Leg N/A N/A Wounding Event: Gradually Appeared N/A N/A Primary Etiology: Venous Leg Ulcer N/A N/A Comorbid History: Cataracts, Asthma, Sleep Apnea, N/A N/A Deep Vein Thrombosis, Hypertension, Peripheral Venous Disease, Osteoarthritis, Received Chemotherapy, Received Radiation Date Acquired: 02/24/2021 N/A N/A Weeks of Treatment: 4 N/A N/A Wound Status: Open N/A N/A Measurements L x W x D (cm) 3x2.5x0.1 N/A N/A Area (cm) : 5.89 N/A N/A Volume (cm) : 0.589 N/A N/A % Reduction in Area: 0.00% N/A N/A % Reduction in Volume: 0.00% N/A N/A Classification: Full Thickness Without Exposed N/A N/A Support Structures Exudate Amount: Medium N/A N/A Exudate Type: Serosanguineous N/A N/A Exudate Color: red, brown N/A  N/A Granulation Amount: Small (1-33%) N/A N/A Granulation Quality: Red, Hyper-granulation N/A N/A Necrotic Amount: Large (67-100%) N/A N/A Exposed Structures: Fat Layer (Subcutaneous Tissue): N/A N/A Yes Fascia: No Tendon: No Muscle: No Joint: No Bone: No Epithelialization: None N/A N/A Debridement: Debridement - Excisional N/A N/A Pre-procedure Verification/Time 10:49 N/A N/A Out Taken: Pain Control: Lidocaine 4% Topical Solution N/A N/A Tissue Debrided: Subcutaneous, Slough N/A N/A Level: Skin/Subcutaneous Tissue N/A N/A Debridement Area (sq cm): 7.5 N/A N/A Instrument: Curette N/A N/A Bleeding: Minimum N/A N/A Hemostasis Achieved: Pressure N/A N/A Procedural Pain: 0 N/A N/A RONYA, GILCREST. ([id number]) Post Procedural Pain: 0 N/A N/A Debridement Treatment Procedure was tolerated well N/A N/A Response: Post  Debridement 3x2.5x0.1 N/A N/A Measurements L x W x D (cm) Post Debridement Volume: 0.589 N/A N/A (cm) Procedures Performed: Debridement N/A N/A Treatment Notes Wound #13 (Lower Leg) Wound Laterality: Left, Posterior, Distal Cleanser Soap and Water Discharge Instruction: Gently cleanse wound with antibacterial soap, rinse and pat dry prior to dressing wounds Peri-Wound Care Topical Primary Dressing Iodosorb 40 (g) Discharge Instruction: Apply IodoSorb to wound bed only as directed. Secondary Dressing ABD Pad 5x9 (in/in) Discharge Instruction: Cover with ABD pad Secured With Compression Wrap Profore Lite LF 3 Multilayer Compression Bandaging System Discharge Instruction: Apply 3 multi-layer wrap as prescribed. Compression Stockings Add-Ons Electronic Signature(s) Signed: 05/10/2021 11:14:01 AM By: Kalman Shan DO Entered By: Kalman Shan on 05/10/2021 11:09:49 [name] ([id number]) -------------------------------------------------------------------------------- Multi-Disciplinary Care Plan Details Patient Name: PRESLEE, REGAS. Date of  Service: 05/10/2021 10:00 AM Medical Record Number: [id number] Patient Account Number: [id number] Date of Birth/Sex: 05/20/1946 (75 y.o. F) Treating RN: Carlene Coria Primary Care Mykenzie Ebanks: [name] Other Clinician: Referring Natalye Kott: [name] Treating Dawanda Mapel/Extender: Yaakov Guthrie in Treatment: 4 Active Inactive Wound/Skin Impairment Nursing Diagnoses: Impaired tissue integrity Goals: Patient/caregiver will verbalize understanding of skin care regimen Date Initiated: 04/12/2021 Target Resolution Date: 06/03/2021 Goal Status: Active Ulcer/skin breakdown will have a volume reduction of 30% by week 4 Date Initiated: 04/12/2021 Target Resolution Date: 05/12/2021 Goal Status: Active Interventions: Assess ulceration(s) every visit Treatment Activities: Referred to DME Jlen Wintle for dressing supplies : 04/12/2021 Skin care regimen initiated : 04/12/2021 Notes: Electronic Signature(s) Signed: 05/11/2021 10:01:55 PM By: Carlene Coria RN Entered By: Carlene Coria on 05/10/2021 10:47:05 Garrels, [name] ([id number]) -------------------------------------------------------------------------------- Pain Assessment Details Patient Name: ELLIOT, SIMONEAUX. Date of Service: 05/10/2021 10:00 AM Medical Record Number: [id number] Patient Account Number: [id number] Date of Birth/Sex: [birth year] (75 y.o. F) Treating RN: Carlene Coria Primary Care Maizee Reinhold: [name] Other Clinician: Referring Taye Cato: [name] Treating Braniya Farrugia/Extender: Yaakov Guthrie in Treatment: 4 Active Problems Location of Pain Severity and Description of Pain Patient Has Paino No Site Locations Pain Management and Medication Current Pain Management: Electronic Signature(s) Signed: 05/11/2021 10:01:55 PM By: Carlene Coria RN Entered By: Carlene Coria on 05/10/2021 10:13:56 Mendonsa, [name]  ([id number]) -------------------------------------------------------------------------------- Patient/Caregiver Education Details Patient Name: HAEDYN, BREAU. Date of Service: 05/10/2021 10:00 AM Medical Record Number: [id number] Patient Account Number: [id number] Date of Birth/Gender: [birth year] (75 y.o. F) Treating RN: Carlene Coria Primary Care Physician: [name] Other Clinician: Referring Physician: [name] Treating Physician/Extender: Yaakov Guthrie in Treatment: 4 Education Assessment Education Provided To: Patient Education Topics Provided Wound/Skin Impairment: Methods: Explain/Verbal Responses: State content correctly Electronic Signature(s) Signed: 05/11/2021 10:01:55 PM By: Carlene Coria RN Entered By: Carlene Coria on 05/10/2021 11:03:00 [name] ([id number]) -------------------------------------------------------------------------------- Wound Assessment Details Patient Name: TRACHELLE, LOW. Date of Service: 05/10/2021 10:00 AM Medical Record Number: [id number] Patient Account Number: [id number] Date of Birth/Sex: [birth year] (75 y.o. F) Treating RN: Carlene Coria Primary Care Christabelle Hanzlik: [name] Other Clinician: Referring Bharath Bernstein: [name] Treating Andre Gallego/Extender: Yaakov Guthrie in Treatment: 4 Wound Status Wound Number: 13 Primary Venous Leg Ulcer Etiology: Wound Location: Left, Distal, Posterior Lower Leg Wound Open Wounding Event: Gradually Appeared Status: Date Acquired: 02/24/2021 Comorbid Cataracts, Asthma, Sleep Apnea, Deep Vein Thrombosis, Weeks Of Treatment: 4 History: Hypertension, Peripheral Venous Disease, Osteoarthritis, Clustered Wound: No Received Chemotherapy, Received Radiation Photos Wound Measurements Length: (cm) 3 Width: (cm) 2.5 Depth: (cm) 0.1 Area: (cm) 5.89 Volume: (cm) 0.589 % Reduction in Area: 0% % Reduction in Volume: 0% Epithelialization:  None Tunneling:  No Undermining: No Wound Description Classification: Full Thickness Without Exposed Support Structures Exudate Amount: Medium Exudate Type: Serosanguineous Exudate Color: red, brown Foul Odor After Cleansing: No Slough/Fibrino Yes Wound Bed Granulation Amount: Small (1-33%) Exposed Structure Granulation Quality: Red, Hyper-granulation Fascia Exposed: No Necrotic Amount: Large (67-100%) Fat Layer (Subcutaneous Tissue) Exposed: Yes Necrotic Quality: Adherent Slough Tendon Exposed: No Muscle Exposed: No Joint Exposed: No Bone Exposed: No Treatment Notes Wound #13 (Lower Leg) Wound Laterality: Left, Posterior, Distal Cleanser Soap and Water Discharge Instruction: Gently cleanse wound with antibacterial soap, rinse and pat dry prior to dressing wounds Peri-Wound Care GLENDELL, SCHLOTTMAN ([id number]) Topical Primary Dressing Iodosorb 40 (g) Discharge Instruction: Apply IodoSorb to wound bed only as directed. Secondary Dressing ABD Pad 5x9 (in/in) Discharge Instruction: Cover with ABD pad Secured With Compression Wrap Profore Lite LF 3 Multilayer Compression Bandaging System Discharge Instruction: Apply 3 multi-layer wrap as prescribed. Compression Stockings Add-Ons Electronic Signature(s) Signed: 05/11/2021 10:01:55 PM By: Carlene Coria RN Entered By: Carlene Coria on 05/10/2021 10:20:12 DORETHEA, STRUBEL ([id number]) -------------------------------------------------------------------------------- Vitals Details Patient Name: LAYZA, SUMMA. Date of Service: 05/10/2021 10:00 AM Medical Record Number: [id number] Patient Account Number: [id number] Date of Birth/Sex: [name] 08, 1946 (75 y.o. F) Treating RN: Carlene Coria Primary Care Missey Hasley: [name] Other Clinician: Referring Hamlin Devine: [name] Treating Caridad Silveira/Extender: Yaakov Guthrie in Treatment: 4 Vital Signs Time Taken: 10:13 Temperature (F): 98.8 Height (in): 63 Pulse (bpm): 92 Weight (lbs):  212 Respiratory Rate (breaths/min): 18 Body Mass Index (BMI): 37.6 Blood Pressure (mmHg): 171/84 Reference Range: 80 - 120 mg / dl Electronic Signature(s) Signed: 05/11/2021 10:01:55 PM By: Carlene Coria RN Entered By: Carlene Coria on 05/10/2021 10:13:45

## 2021-05-12 NOTE — Progress Notes (Signed)
[name], [name] ([id number]) Visit Report for 05/10/2021 Chief Complaint Document Details Patient Name: [name], [name]. Date of Service: 05/10/2021 10:00 AM Medical Record Number: [id number] Patient Account Number: [id number] Date of Birth/Sex: 26-Jul-1946 (75 y.o. F) Treating RN: Dolan Amen Primary Care Provider: [name] Other Clinician: Referring Provider: [name] Treating Provider/Extender: Yaakov Guthrie in Treatment: 4 Information Obtained from: Patient Chief Complaint Left calf venous stasis ulceration. 11/13/16; the patient is here for a left calf recurrent ulceration which is painful and has been present for the last month 01/22/17; the patient re-presents here for recurrent difficulties with blistering and leaking fluid on her left posterior calf 12/10/18; the patient returns to clinic for a wound on the left medial calf 04/12/2021; left lower extremity wound Electronic Signature(s) Signed: 05/10/2021 11:14:01 AM By: Kalman Shan DO Entered By: Kalman Shan on 05/10/2021 11:10:03 Wienke, [name] ([id number]) -------------------------------------------------------------------------------- Debridement Details Patient Name: [name]. Date of Service: 05/10/2021 10:00 AM Medical Record Number: [id number] Patient Account Number: [id number] Date of Birth/Sex: 07/20/1946 (75 y.o. F) Treating RN: Carlene Coria Primary Care Provider: [name] Other Clinician: Referring Provider: [name] Treating Provider/Extender: Yaakov Guthrie in Treatment: 4 Debridement Performed for Wound #13 Left,Distal,Posterior Lower Leg Assessment: Performed By: Physician Kalman Shan, MD Debridement Type: Debridement Severity of Tissue Pre Debridement: Fat layer exposed Level of Consciousness (Pre- Awake and Alert procedure): Pre-procedure Verification/Time Out Yes - 10:49 Taken: Start Time: 10:49 Pain Control: Lidocaine 4% Topical  Solution Total Area Debrided (L x W): 3 (cm) x 2.5 (cm) = 7.5 (cm) Tissue and other material Viable, Non-Viable, Slough, Subcutaneous, Skin: Dermis , Skin: Epidermis, Biofilm, Slough debrided: Level: Skin/Subcutaneous Tissue Debridement Description: Excisional Instrument: Curette Bleeding: Minimum Hemostasis Achieved: Pressure End Time: 10:51 Procedural Pain: 0 Post Procedural Pain: 0 Response to Treatment: Procedure was tolerated well Level of Consciousness (Post- Awake and Alert procedure): Post Debridement Measurements of Total Wound Length: (cm) 3 Width: (cm) 2.5 Depth: (cm) 0.1 Volume: (cm) 0.589 Character of Wound/Ulcer Post Debridement: Improved Severity of Tissue Post Debridement: Fat layer exposed Post Procedure Diagnosis Same as Pre-procedure Electronic Signature(s) Signed: 05/10/2021 11:14:01 AM By: Kalman Shan DO Signed: 05/11/2021 10:01:55 PM By: Carlene Coria RN Entered By: Carlene Coria on 05/10/2021 10:52:25 [name], [name] ([id number]) -------------------------------------------------------------------------------- HPI Details Patient Name: [name], [name]. Date of Service: 05/10/2021 10:00 AM Medical Record Number: [id number] Patient Account Number: [id number] Date of Birth/Sex: 07-Jun-1946 (75 y.o. F) Treating RN: Dolan Amen Primary Care Provider: [name] Other Clinician: Referring Provider: [name] Treating Provider/Extender: Yaakov Guthrie in Treatment: 4 History of Present Illness HPI Description: Pleasant 75 year old with history of chronic venous insufficiency. No diabetes or peripheral vascular disease. Left ABI 1.29. Questionable history of left lower extremity DVT. She developed a recurrent ulceration on her left lateral calf in December 2015, which she attributes to poor diet and subsequent lower extremity edema. She underwent endovenous laser ablation of her left greater saphenous vein in 2010. She underwent laser  ablation of accessory branch of left GSV in April 2016 by Dr. Kellie Simmering at [facility]. She was previously wearing Unna boots, which she tolerated well. Tolerating 2 layer compression and cadexomer iodine. She returns to clinic for follow-up and is without new complaints. She denies any significant pain at this time. She reports persistent pain with pressure. No claudication or ischemic rest pain. No fever or chills. No drainage. READMISSION 11/13/16; this is a 75 year old woman who is not a diabetic. She is here for a  review of a painful area on her left medial lower extremity. I note that she was seen here previously last year for wound I believe to be in the same area. At that time she had undergone previously a left greater saphenous vein ablation by Dr. Kellie Simmering and she had a ablation of the anterior accessory branch of the left greater saphenous vein in March 2016. Seeing that the wound actually closed over. In reviewing the history with her today the ulcer in this area has been recurrent. She describes a biopsy of this area in 2009 that only showed stasis physiology. 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 Do not see that she has ever had arterial studies in the left leg. She tells me she has a follow-up with Dr. Kellie Simmering in roughly 10 days In any case she developed the reopening of this area roughly a month ago. On the background of this she describes rapidly increasing edema which has responded to Lasix 40 mg and metolazone 2.5 mg as well as the patient's lymph massage. She has been told she has both venous insufficiency and lymphedema but she cannot tolerate compression stockings 11/28/16; the patient saw Dr. Kellie Simmering recently. Per the patient he did arterial Dopplers in the office  that did not show evidence of arterial insufficiency, per the patient he stated "treat this like an ordinary venous ulcer". She also saw her dermatologist Dr. Ronnald Ramp who felt that this was more of a vascular ulcer. In general things are improving although she arrives today with increasing bilateral lower extremity edema with weeping a deeper fluid through the wound on the left medial leg compatible with some degree of lymphedema 12/04/16; the patient's wound is fully epithelialized but I don't think fully healed. We will do another week of depression with Promogran and TCA however I suspect we'll be able to discharge her next week. This is a very unusual-looking wound which was initially a figure-of-eight type wound lying on its side surrounded by petechial like hemorrhage. She has had venous ablation on this side. She apparently does not have an arterial issue per Dr. Kellie Simmering. She saw her dermatologist thought it was "vascular". Patient is definitely going to need ongoing compression and I talked about this with her today she will go to elastic therapy after she leaves here next week 12/11/16; the patient's wound is not completely closed today. She has surrounding scar tissue and in further discussion with the patient it would appear that she had ulcers in this area in 2009 for a prolonged period of time ultimately requiring a punch biopsy of this area that only showed venous insufficiency. I did not previously pickup on this part of the history from the patient. 12/18/16; the patient's wound is completely epithelialized. There is no open area here. She has significant bilateral venous insufficiency with secondary lymphedema to a mild-to-moderate degree she does not have compression stockings.. She did not say anything to me when I was in the room, she told our intake nurse that she was still having pain in this area. This isn't unusual recurrent small open area. She is going to go to elastic therapy to  obtain compression stockings. 12/25/16; the patient's wound is fully epithelialized. There is no open area here. The patient describes some continued  episodic discomfort in this area medial left calf. However everything looks fine and healed here. She is been to elastic therapy and caught herself 15-20 mmHg stockings, they apparently were having trouble getting 20-30 mm stockings in her size 01/22/17; this is a patient we discharged from the clinic a month ago. She has a recurrent open wound on her medial left calf. She had 15 mm support stockings. I told her I thought she needed 20-30 mm compression stockings. She tells me that she has been ill with hospitalization secondary to asthma and is been found to have severe hypokalemia likely secondary to a combination of Lasix and metolazone. This morning she noted blistering and leaking fluid on the posterior part of her left leg. She called our intake nurse urgently and we was saw her this afternoon. She has not had any real discomfort here. I don't know that she's been wearing any stockings on this leg for at least 2-3 days. ABIs in this clinic were 1.21 on the right and 1.3 on the left. She is previously seen vascular surgery who does not think that there is a peripheral arterial issue. 01/30/17; Patient arrives with no open wound on the left leg. She has been to elastic therapy and obtained 20-13mmhg below knee stockings and she has one on the right leg today. READMISSION 02/19/18; this Balsley is a now 75 year old patient we've had in this clinic perhaps 3 times before. I had last looked at her from January 07 December 2016 with an area on the medial left leg. We discharged her on 12/25/16 however she had to be readmitted on 01/22/17 with a recurrence. I have in my notes that we discharged her on 20-30 mm stockings although she tells me she was only wearing support hose because she cannot get stockings on predominantly related to her cervical spine  surgery/issues. She has had previous ablations done by vein and vascular in [place] including a great saphenous vein ablation on the left with an anterior accessory branch ablation I think both of these were in 2016. On one of the previous visit she had a biopsy noted 2009 that was negative. She is not felt to have an arterial issue. She is not a diabetic. She does have a history of obstructive sleep apnea hypertension asthma as well as chronic venous insufficiency and lymphedema. On this occasion she noted 2 dry scaly patch on her left leg. She tried to put lotion on this it didn't really help. There were 2 open areas.the patient has been seeing her primary physician from 02/05/18 through 02/14/18. She had Unna boots applied. The superior wound now on the lateral left leg has closed but she's had one wound that remains open on the lateral left leg. This is not the same spot as we dealt with in 2018. ABIs in this clinic were 1.3 bilaterally [name], [name] ([id number]) 02/26/18; patient has a small wound on the left lateral calf. Dimensions are down. She has chronic venous insufficiency and lymphedema. 03/05/18; small open area on the left lateral calf. Dimensions are down. Tightly adherent necrotic debris over the surface of the wound which was difficult to remove. Also the dressing [over collagen] stuck to the wound surface. This was removed with some difficulty as well. Change the primary dressing to Hydrofera Blue ready 03/12/18; small open area on the left lateral calf. Comes in with tightly adherent surface eschar as well as some adherent Hydrofera Blue. 03/19/18; open area on the left lateral calf. Again adherent surface  eschar as well as some adherent Hydrofera Blue nonviable subcutaneous tissue. She complained of pain all week even with the reduction from 4-3 layer compression I put on last week. Also she had an increase in her ankle and calf measurements probably related to the same  thing. 03/26/18; open area on the left lateral calf. A very small open area remains here. We used silver alginate starting last week as the Hydrofera Blue seem to stick to the wound bed. In using 4-layer compression 04/02/18; the open area in the left lateral calf at some adherent slough which I removed there is no open area here. We are able to transition her into her own compression stocking. Truthfully I think this is probably his support hose. However this does not maintain skin integrity will be limited. She cannot put over the toe compression stockings on because of neck problems hand problems etc. She is allergic to the lining layer of juxta lites. We might be forced to use extremitease stocking should this fail READMIT 11/24/2018 Patient is now a 75 year old woman who is not a diabetic. She has been in this clinic on at least 3 previous occasions largely with recurrent wounds on her left leg secondary to chronic venous insufficiency with secondary lymphedema. Her situation is complicated by inability to get stockings on and an allergy to neoprene which is apparently a component and at least juxta lites and other stockings. As a result she really has not been wearing any stockings on her legs. She tells [place] that roughly 2 or 3 weeks ago she started noticing a stinging sensation just above her ankle on the left medial aspect. She has been diagnosed with pseudogout and she wondered whether this was what she was experiencing. She tried to dress this with something she bought at the store however subsequently it pulled skin off and now she has an open wound that is not improving. She has been using Vaseline gauze with a cover bandage. She saw her primary doctor last week who put an Haematologist on her. ABIs in this clinic was 1.03 on the left 2/12; the area is on the left medial ankle. Odd-looking wound with what looks to be surface epithelialization but a multitude of small petechial openings. This  clearly not closed yet. We have been using silver alginate under 3 layer compression with TCA 2/19; the wound area did not look quite as good this week. Necrotic debris over the majority of the wound surface which required debridement. She continues to have a multitude of what looked to be small petechial openings. She reminds [place] that she had a biopsy on this initially during her first outbreak in 2015 in [place] [facility]. She expresses concern about this being a possible melanoma. 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 I assured her that this does not look anything like melanoma. Except for the petechial reaction it does look like a venous insufficiency area and she certainly has evidence of this on both sides 2/26; a difficult area on the left medial ankle. The patient clearly has chronic venous hypertension with some degree of lymphedema. The odd thing about the area is the small petechial hemorrhages. I am not really sure how to explain this. This was present last time and this is not a compression injury. We have been using Hydrofera Blue which I changed to last week 3/4; still using Hydrofera Blue. Aggressive debridement today. She does  not have known arterial issues. She has seen Dr. Kellie Simmering at [facility] vein and vascular and and has an ablation on the left. [Anterior accessory branch of the greater saphenous]. From what I remember they did not feel she had an arterial issue. The patient has had this area biopsied in 2009 at [facility] dermatology and by her recollection they said this was "stasis". She is also follow-up with dermatology locally who thought that this was more of a vascular issue 3/11; using Hydrofera Blue. Aggressive debridement today. She does not have an arterial issue. We are using 3 layer compression although we may need to go to 4. The patient has been in for multiple changes  to her wrap since I last saw her a week ago. She says that the area was leaking. I do not have too much more information on what was found 01/19/19 on evaluation today patient was actually being seen for a nurse visit when unfortunately she had the area on her left lateral lower extremity as well as weeping from the right lower extremity that became apparent. Therefore we did end up actually seeing her for a full visit with myself. She is having some pain at this site as well but fortunately nothing too significant at this point. No fevers, chills, nausea, or vomiting noted at this time. 3/18-Patient is back to the clinic with the left leg venous leg ulcer, the ulcer is larger in size, has a surface that is densely adherent with fibrinous tissue, the Hydrofera Blue was used but is densely adherent and there was difficulty in removing it. The right lower extremity was also wrapped for weeping edema. Patient has a new area over the left lateral foot above the malleolus that is small and appears to have no debris with intact surrounding skin. Patient is on increased dose of Lasix also as a means to edema management 3/25; the patient has a nonhealing venous ulcer on the medial left leg and last week developed a smaller area on the lateral left calf. We have been using Hydrofera Blue with a contact layer. 4/1; no major change in these wounds areas. Left medial and more recently left lateral calf. I tried Iodoflex last week to aid in debridement she did not tolerate this. She stated her pain was terrible all week. She took the top layer of the 4 layer compression off. 4/8; the patient actually looks somewhat better in terms of her more prominent left lateral calf wound. There is some healthy looking tissue here. She is still complaining of a lot of discomfort. 4/15; patient in a lot of pain secondary to sciatica. She is on a prednisone taper prescribed by her primary physician. She has the 2 areas one  on the left medial and more recently a smaller area on the left lateral calf. Both of these just above the malleoli 4/22; her back pain is better but she still states she is very uncomfortable and now feels she is intolerant to the [employer]. No real change in the wounds we have been using Sorbact. She has been previously intolerant to Iodoflex. There is not a lot of option about what we can use to debride this wound under compression that she no doubt needs. sHe states Ultram no longer works for her pain 4/29; no major change in the wounds slightly increased depth. Surface on the original medial wound perhaps somewhat improved however the more recent area on the lateral left ankle is 100% covered in very adherent debris we  have been using Sorbact. She tolerates 4 layer compression well and her edema control is a lot better. She has not had to come in for a nurse check 5/6; no major change in the condition of the wounds. She did consent to debridement today which was done with some difficulty. Continuing Sorbact. She did not tolerate Iodoflex. She was in for a check of her compression the day after we wrapped her last week this was adjusted but nothing much was found 5/13; no major change in the condition or area of the wounds. I was able to get a fairly aggressive debridement done on the lateral left leg wound. Even using Sorbact under compression. She came back in on Friday to have the wrap changed. She says she felt uncomfortable on the lateral aspect of her ankle. She has a long history of chronic venous insufficiency including previous ablation surgery on this side. 5/20-Patient returns for wounds on left leg with both wounds covered in slough, with the lateral leg wound larger in size, she has been in 3 layer compression and felt more comfortable, she describes pain in ankle, in leg and pins and needles in foot, and is about to try Pamelor for this 6/3; wounds on the left lateral and left  medial leg. The area medially which is the most recent of the 2 seems to have had the largest increase in [place], AYALA RIBBLE. ([id number]) dimensions. We have been using Sorbac to try and debride the surface. She has been to see orthopedics they apparently did a plain x-ray that was indeterminant. Diagnosed her with neuropathy and they have ordered an MRI to determine if there is underlying osteomyelitis. This was not high on my thought list but I suppose it is prudent. We have advised her to make an appointment with vein and vascular in [place]. She has a history of a left greater saphenous and accessory vein ablations I wonder if there is anything else that can be done from a surgical point of view to help in these difficult refractory wounds. We have previously healed this wound on one occasion but it keeps on reopening [medial side] 6/10; deep tissue culture I did last week I think on the left medial wound showed both moderate E. coli and moderate staph aureus [MSSA]. She is going to require antibiotics and I have chosen Augmentin. We have been using Sorbact and we have made better looking wound surface on both sides but certainly no improvement in wound area. She was back in last Friday apparently for a dressing changes the wrap was hurting her outer left ankle. She has not managed to get a hold of vein and vascular in [place]. We are going to have to make her that appointment 6/17; patient is tolerating the Augmentin. She had an MRI that I think was ordered by orthopedic surgeon this did not show osteomyelitis or an abscess did suggest cellulitis. We have been using Sorbact to the lateral and medial ankles. We have been trying to arrange a follow-up appointment with vein and vascular in [place] or did her original ablations. We apparently an area sent the request to vein and vascular in [facility] 6/24; patient has completed the Augmentin. We do not yet have a vein and vascular appointment  in [employer]. I am not sure what the issue is here we have asked her to call tomorrow. We are using Sorbact. Making some improvements and especially the medial wound. Both surfaces however look better medial and lateral. 7/1; the patient  has been in contact with vein and vascular in [place] but has not yet received an appointment. Using Sorbact we have gradually improve the wound surface with no improvement in surface area. She is approved for Apligraf but the wound surface still is not completely viable. She has not had to come in for a dressing change 7/8; the patient has an appointment with vein and vascular on 7/31 which is a Friday afternoon. She is concerned about getting back here for [place] to dress her wounds. I think it is important to have them goal for her venous reflux/history of ablations etc. to see if anything else can be done. She apparently tested positive for 1 of the blood tests with regards to lupus and saw a rheumatologist. He has raised the issue of vasculitis again. I have had this thought in the past however the evidence seems overwhelming that this is a venous reflux etiology. If the rheumatologist tells me there is clinical and laboratory investigation is positive for lupus I will rethink this. 7/15; the patient's wound surfaces are quite a bit better. The medial area which was her original wound now has no depth although the lateral wound which was the more recent area actually appears larger. Both with viable surfaces which is indeed better. Using Sorbact. I wanted to use Apligraf on her however there is the issue of the vein and vascular appointment on 7/31 at 2:00 in the afternoon which would not allow her to get back to be rewrapped and they would no doubt remove the graft 7/22; the patient's wound surfaces have moderate amount of debris although generally look better. The lateral one is larger with 2 small satellite areas superiorly. We are waiting for her vein and  vascular appointment on 7/31. She has been approved for Apligraf which I would like to use after th 7/29; wound surfaces have improved no debridement is required we have been using Sorbact. She sees vein and vascular on Friday with this so question of whether anything can be done to lessen the likelihood of recurrence and/or speed the healing of these areas. She is already had previous ablations. She no doubt has severe venous hypertension 8/5-Patient returns at 1 week, she was in [place] for 3 days by her podiatrist, we have been using so backed to the wound, she has increased pain in both the wounds on the left lower leg especially the more distal one on the lateral aspect 8/12-Patient returns at 1 week and she is agreeable to having debridement in both wounds on her left leg today. We have been using Sorbact, and vascular studies were reviewed at last visit 8/19; the patient arrives with her wounds fairly clean and no debridement is required. We have used Sorbact which is really done a nice job in cleaning up these very difficult wound surfaces. The patient saw Dr. Donzetta Matters of vascular surgery on 7/31. He did not feel that there was an arterial component. He felt that her treated greater saphenous vein is adequately addressed and that the small saphenous vein did not appear to be involved significantly. She was also noted to have deep venous reflux which is not treatable. Dr. Donzetta Matters mentioned the possibility of a central obstructive component leading to reflux and he offered her central venography. She wanted to discuss this or think about it. I have urged her to go ahead with this. She has had recurrent difficult wounds in these areas which do heal but after months in the clinic. If there  is anything that can be done to reduce the likelihood of this I think it is worth it. 9/2 she is still working towards getting follow-up with Dr. Donzetta Matters to schedule her CT. Things are quite a bit worse venography. I  put Apligraf on 2 weeks ago on both wounds on the medial and lateral part of her left lower leg. She arrives in clinic today with 3 superficial additional wounds above the area laterally and one below the wound medially. She describes a lot of discomfort. I think these are probably wrapped injuries. Does not look like she has cellulitis. 07/20/2019 on evaluation today patient appears to be doing somewhat poorly in regard to her lower extremity ulcers. She in fact showed signs of erythema in fact we may even be dealing with an infection at this time. Unfortunately I am unsure if this is just infection or if indeed there may be some allergic reaction that occurred as a result of the Apligraf application. With that being said that would be unusual but nonetheless not impossible in this patient is one who is unfortunately allergic to quite a bit. Currently we have been using the Sorbact which seems to do as well as anything for her. I do think we may want to obtain a culture today to see if there is anything showing up there that may need to be addressed. 9/16; noted that last week the wounds look worse in 1 week follow-up of the Apligraf. Using Sorbact as of 2 days ago. She arrives with copious amounts of drainage and new skin breakdown on the back of the left calf. The wounds arm more substantial bilaterally. There is a fair amount of swelling in the left calf no overt DVT there is edema present I think in the left greater than right thigh. She is supposed to go on 9/28 for CT venography. The wounds on the medial and lateral calf are worse and she has new skin breakdown posteriorly at least new for me. This is almost developing into a circumferential wound area The Apligraf was taken off last week which I agree with things are not going in the right direction a culture was done we do not have that back yet. She is on Augmentin that she started 2 days ago 9/23; dressing was changed by her nurses on  Monday. In general there is no improvement in the wound areas although the area looks less angry than last week. She did get Augmentin for MSSA cultured on the 14th. She still appears to have too much swelling in the left leg even with 3 layer compression 9/30; the patient underwent her procedure on 9/28 by Dr. Donzetta [facility]. She was discovered to have the common iliac vein measuring 12.2 mm but at the level of L4-L5 measured 3 mm. After stenting it measured 10 mm. 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 Rouleaux flow in the common femoral and femoral vein was observed much improved after stenting. We are using silver alginate to the wounds on the medial and lateral ankle on the left. 4 layer compression 10/7; the patient had fluid swelling around her knee and 4 layer compression. At the advice of vein and vascular this was reduced to 3 layer which she is tolerating better. We have been using silver alginate under 3 layer compression since last Friday 10/14; arrives with the areas on the left ankle looking a lot better. Inflammation in the area also a lot better. She  came in for a nurse check on 10/9 10/21; continued nice improvement. Slight improvements in surface area of both the medial and lateral wounds on the left. A lot of the satellite lesions in the weeping erythema around these from stasis dermatitis is resolved. We have been using silver alginate [name], [name] ([id number]) 10/28; general improvement in the entire wound areas although not a lot of change in dimensions the wound certainly looks better. There is a lot less in terms of venous inflammation. Continue silver alginate this week however look towards Hydrofera Blue next week 11/4; very adherent debris on the medial wound left wound is not as bad. We have been using silver alginate. Change to [facility] today 11/11; very adherent debris on both wound areas. She went to vein and vascular  last week and follow-up they put in [place] boot on this today. He says the [name] was adherent. Wound is definitely not as good as last week. Especially on the left there the satellite lesions look more prominent 11/18; absolutely no better. erythema on lateral aspect with tenderness. 09/30/2019 on evaluation today patient appears to actually be doing better. Dr. Dellia Nims did put her on doxycycline last week which I do believe has helped her at this point. Fortunately there is no signs of active infection at this time. No fevers, chills, nausea, vomiting, or diarrhea. I do believe he may want extend the doxycycline for 7 additional days just to ensure everything does completely cleared up the patient is in agreement with that plan. Otherwise she is going require some sharp debridement today 12/2; patient is completing a 2-week course of doxycycline. I gave her this empirically for inflammation as well as infection when I last saw her 2 weeks ago. All of this seems to be better. She is using silver alginate she has the area on the medial aspect of the larger area laterally and the 2 small satellite regions laterally above the major wound. 12/9; the patient's wound on the left medial and left lateral calf look really quite good. We have been using silver alginate. She saw vein and vascular in follow-up on 10/09/2019. She has had a previous left greater saphenous vein ablation by Dr. Oscar La in 2016. More recently she underwent a left common iliac vein stent by Dr. 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 The swelling is improved and certainly the wounds have improved. The patient shows [place] today area on the right medial calf there is almost no wound but leaking lymphedema. She says she start this started 3 or 4 days ago. She did not traumatize it. It is not painful. She does not wear compression on that side 12/16; the patient continues to do well laterally. Medially still requiring  debridement. The area on the right calf did not materialize to anything and is not currently open. We wrapped this last time. She has support stockings for that leg although I am not sure they are going to provide adequate compression 12/23; the lateral wound looks stable. Medially still requiring debridement for tightly adherent fibrinous debris. We've been using silver alginate. Surface area not any different 12/30; neither wound is any better with regards to surface and the area on the left lateral is larger. I been using silver alginate to the left lateral which look quite good last week and Sorbact to the left medial 11/11/2019. Lateral wound area actually looks better and somewhat smaller. Medial still requires a very aggressive debridement today. We have been using  Sorbact on both wound areas 1/13; not much better still adherent debris bilaterally. I been using Sorbact. She has severe venous hypertension. Probably some degree of dermal fibrosis distally. I wonder whether tighter compression might help and I am going to try that today. We also need to work on the bioburden 1/20; using Sorbact. She has severe venous hypertension status post stent placement for pelvic vein compression. We applied gentamicin last time to see if we could reduce bioburden I had some discussion with her today about the use of pentoxifylline. This is occasionally used in this setting for wounds with refractory venous insufficiency. However this interacts with Plavix. She tells me that she was put on this after stent placement for 3 months. She will call Dr. Claretha Cooper office to discuss 1/27; we are using gentamicin under Sorbact. 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 She has a stent. Wound medially is measuring smaller this week. Laterally measuring slightly larger although she has some satellite lesions superiorly 2/3; gentamicin under Sorbact under 4-layer compression. She has severe  venous hypertension with may Thurner pathophysiology. She has a stent on Plavix. Her wounds are measuring smaller this week. More substantially laterally where there is a satellite lesion superiorly. 2/10; gentamicin under Sorbac. 4-layer compression. Patient communicated with Dr. Donzetta Matters at vein and vascular in [place]. He is okay with the patient coming off Plavix I will therefore start her on pentoxifylline for a 1 month trial. In general her wounds look better today. I had some concerns about swelling in the left thigh however she measures 61.5 on the right and 63 on the mid thigh which does not suggest there is any difficulty. The patient is not describing any pain. 2/17; gentamicin under Sorbac 4-layer compression. She has been on pentoxifylline for 1 week and complains of loose stool. No nausea she is eating and drinking well 2/24; the patient apparently came in 2 days ago for a nurse visit when her wrap fell down. Both areas look a little worse this week macerated medially and satellite lesions laterally. Change to silver alginate today 3/3; wounds are larger today especially medially. She also has more swelling in her foot lower leg and I even noted some swelling in her posterior thigh which is tender. I wonder about the patency of her stent. Fortuitously she sees Dr. Claretha Cooper group on Friday 3/10; Mrs. Schweers was seen by vein and vascular on 3/5. The patient underwent ultrasound. There was no evidence of thrombosis involving the IVC no evidence of thrombosis involving the right common iliac vein there is no evidence of thrombosis involving the right external iliac vein the left external vein is also patent. The right common iliac vein stent appears patent bilateral common femoral veins are compressible and appear patent. I was concerned about the left common iliac stent however it looks like this is functional. She has some edema in the posterior thigh that was tender she still has that this  week. I also note they had trouble finding the pulses in her left foot and booked her for an ABI baseline in 4 weeks. She will follow up in 6 months for repeat IVC duplex. The patient stopped the pentoxifylline because of diarrhea. It does not look like that was being effective in any case. I have advised her to go back on her aspirin 81 mg tablet, vascular it also suggested this 3/17; comes in today with her wound surfaces a lot better. The excoriations from last week considerably better probably secondary  to the TCA. We have been using silver alginate 3/24; comes in today with smaller wounds both medially and laterally. Both required debridement. There are 2 small satellite areas superiorly laterally. She also has a very odd bandlike area in the mid calf almost looking like there was a weakness in the wrap in a localized area. I would write this off as being this however anteriorly she has a small raised ballotable area that is very tender almost reminiscent of an abscess but there was no obvious purulent surface to it. 02/04/20 upon evaluation today patient appears to be doing fairly well in regard to her wounds today. Fortunately there is no signs of active infection at this time. No fevers, chills, nausea, vomiting, or diarrhea. She has been tolerating the dressing changes without complication. Fortunately I feel like she is showing signs of improvement although has been sometime since have seen her. Nonetheless the area of concern that Dr. Dellia Nims had last week where she had possibly an area of the wrap that was we can allow the leg to bulge appears to be doing significantly better today there is no signs of anything worsening. EMALEY, APPLIN ([id number]) 4/7; the patient's wounds on her medial and lateral left leg continue to contract. We have been using a regular alginate. Last week she developed an area on the right medial lower leg which is probably a venous ulcer as well. 4/14; the  wounds on her left medial and lateral lower leg continue to contract. Surface eschar. We have been using regular alginate. The area on the right medial lower leg is closed. We have been putting both legs under 4-layer contraction. The patient went back to see vein and vascular she had arterial studies done which were apparently "quite good" per the patient although I have not read their notes I have never felt she had an arterial issue. 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 This is been longstanding and refractory to exercise, leg elevation and longstanding use of compression wraps in our clinic as well as compression stockings on the times we have been able to get these to heal 4/21; we thought she actually might be close this week however she arrives in clinic with a lot of edema in her upper left calf and into her posterior thigh. This is been an intermittent problem here. She says the wrap fell down but it was replaced with a nurse visit on Monday. We are using calcium alginate to the wounds and the wound sizes there not terribly larger than last week but there is a lot more edema 4/28; again wound edges are smaller on both sides. Her edema is better controlled than last time. She is obtained her compression pumps from [employer] although they have not been to her home to set these up. 5/5; left medial and left lateral both look stable. I am not sure the medial is any smaller. We have been using calcium alginate under 4-layer compression. oShe had an area on the right medial. This was eschared today. We have been wrapping this as well. She does not tolerate external compression stockings due to a history of various contact allergies. She has her compression pumps however the representative from the company is coming on her to show her how to use these tomorrow 5/19; patient with severe chronic venous insufficiency secondary to central  venous disease. She had a stent placed in her left common iliac vein. She has done better since but  still difficult to control wounds. She comes in today with nothing open on the right leg. Her areas on the left medial and left lateral are just about closed. We are using calcium alginate under 4-layer compression. She is using her external compression pumps at home She only has 15-20 support stockings. States she cannot get anything tighter than that on. 03/30/20-Patient returns at 1 week, the wounds on the left leg are both slightly bigger, the last week she was on 3 layer compression which started to slide down. She is starting to use her lymphedema pumps although she stated on 1 day her right ankle started to swell up and she have to stop that day. Unfortunately the open area seem to oscillate between improving to the point of healing and then flaring up all to do with effectiveness of compression or lack of due to the left leg topography not keeping the compression wraps from rolling down 6/2 patient comes in with a 15/20 mmHg stocking on the right leg. She tells me that she developed a lot of swelling in her ankles she saw orthopedics she was felt to possibly be having a flare of pseudogout versus some other type of arthritis. She was put on steroids for a respiratory issue so that helps with the inflammation. She has not been using the pumps all week. She thinks the left thigh is more swollen than usual and I would agree with that. She has an appointment with Dr. Donzetta Matters 9 days or so from now 6/9; both wounds on the left medial and left lateral are smaller. We have been using calcium alginate under compression. She does not have an open wound on the right leg she is using a stocking and her compression pumps things are going well. She has an appointment with Dr. Donzetta Matters with regards to her stent in the left common iliac vein 6/16; the wounds on the left medial and left lateral ankle continues to  contract. The patient saw Dr. Donzetta Matters and I think he seems satisfied. Ordered follow-up venous reflux studies on both sides in September. Cautioned that she may need thigh-high stockings. She has been using calcium alginate under compression on the left and her own stocking on the right leg. She tells [place] there are no open wounds on the right 6/23; left lateral is just about closed. Medial required debridement today. We have been using calcium alginate. Extensive discussion about the compression pumps she is only using these on 25 mmHg states she could not take 40 or 30 when the wrap came out to her home to demonstrate these. He said they should not feel tight 6/30; the left lateral wound has a slight amount of eschar. . The area medially is about the same using Hydrofera Blue. 7/7; left lateral wound still has some eschar. I will remove this next week may be closed. The area medially is very small using Hydrofera Blue with improvement. Unfortunately the stockings fell down. Unfortunately the blisters have developed at the edge of where the wrap fell. When this happened she says her legs hurt she did not use her pumps. We are not open Monday for her to come in and change the wraps and she had an appointment yesterday. She also tells me that she is going to have an MRI of her back. She is having pain radiating into her left anterior leg she thinks her from an L5 disc. She saw Dr. Ellene Route of neurosurgery 7/14; the area on the left lateral ankle area  is closed. Still a small area medially however it looks better as well. We have been using Hydrofera Blue under 4-layer compression 7/21; left lateral ankle is still closed however her wound on the medial left calf is actually larger. This is probably because Hydrofera Blue got stuck to the wound. She came in for a nurse change on Friday and will do that again this week I was concerned about the amount of swelling that she had last week however she is using her  compression pumps twice a day and the swelling seems well controlled 7/28; remaining wound on the left medial lower leg is smaller. We have been using moistened silver collagen under compression she is coming back for a nurse visit. For reasons that were not really clear she was just keeping her legs elevated and not using her compression pumps. I have asked her to use the compression pumps. She does not have any wounds on the right leg 06/15/20-Patient returns at 2 weeks, her LLE edema is worse and she developed a blister wound that is new and has bigger posterior calf wound on right, we are using Prisma with pad, 4 layer compression. she has been on lasix 40 mg daily 8/18; patient arrives today with things a lot worse than I remember from a few weeks ago. She was seen last week. Noted that her edema was worse and that she now had a left lateral wound as well as deteriorating edema in the medial and posterior part of the lower leg. She says she is using his or her external compression pumps once a day although I wonder about the compliance. 8/25; weeping area on the right medial lower leg. This had actually gotten a small localized area of her compression stocking wet. oOn the left side there is a large denuded area on the posterior medial lower leg and smaller area on the lateral. This was not the original areas that we dealt with. 9/1 the patient's wound on the left leg include the left lateral and left posterior. Larger superficial wounds weeping. She has very poor edema control. Tender localized edema in the left lower medial ankle/heel probably because of localized wrap issues. She freely admits she is not using the compression pumps. She has been up on her feet a lot. She thinks the hydrofera blue is contributing to the pain she is experiencing.. This is a complaint that I have occasionally heard 9/8; really not much improvement. The patient is still complaining of a lot of pain particularly  when she uses compression pumps. I switched her to silver alginate last time because she found the Hydrofera Blue to be irritating. I don't hear much difference in her description with the silver alginate. 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 She has follow-up with Dr. Donzetta Matters I think for ultrasound next month NYKIA, TURKO ([id number]) 9/15; quite a bit of improvement today. We have less edema and more epithelialization in both of her wound areas on the left medial and left lateral calf. These are not the site of her original wounds in this area. She says she has been using her compression pumps for 30 minutes twice a day, there pain issues that never quite understood. Silver alginate as the primary dressing 9/22; continued improvement. Both areas medially and laterally still have a small open area there is some eschar. She continues to complain of left medial ankle pain.  Swelling in the leg is in much better condition. We have been using silver alginate 9/29; continued improvement. Both areas medially and laterally in the left calf look as though they are close some minor surface eschar but I think this is epithelialized. She comes in today saying she has a ruptured disc at L4-L5 cannot bend over to put on her stockings. 10/6; patient comes in today with no open wounds on either leg. However her edema on the left leg in the upper one third of the lower leg is poorly controlled nonpitting. She says that she could not use the pumps for 2 days and then she has been using the last couple of days. It is not clear to me she has been able to get her stocking on. She has back problems. Mrs. Desautel has severe chronic venous insufficiency with secondary lymphedema. Her venous insufficiency is partially centrally mediated and that she is now post stent in the left common iliac vein. [facility] Thurner's syndrome/physiology]. She  follows up with them on 10/15. She wears 20/30 below-knee stockings. She is supposed to use compression pumps at home although I think her compliance about with this is been less than 100%. I have asked her to use these 3 times a day. Finally I think she has lipodermatosclerosis in the left lower leg with an inverted bottle sign. It is been a major problem controlling the edema in the left leg. The right leg we have had wounds on but not as significant a problem is on the left READMISSION 04/12/2021 Mrs. Sagun is a 75 year old woman we know well in this clinic. She has severe chronic venous insufficiency. 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 I believe she has had bilateral greater saphenous vein ablation in the past as well. She tells me that this wound opened sometime in March. She had a fall and thinks it was initially abrasion. She developed areas she describes as little blisters on the anterior part of her leg and she saw dermatology and was treated for methicillin staph aureus with several rounds of antibiotics. She has been using support stockings on the left leg and says this is the only thing she can get on. Her compression pump use maybe once a day she says if she did not use one she use the other. She comes in today with incredible swelling in the left leg with a wound on the left posterior calf. She has been using Neosporin to this previously a hydrocolloid. 6/15; patient arrives back for 1 week follow-up.Marland Kitchen Apparently her wrap fell down she did not call us to replace this. He has poor edema control. She only uses her compression pumps once a day 6/29; patient presents for 1 week follow-up. She has tolerated the compression wrap well. We have been using Iodoflex under the wrap. She has no issues or complaints today. 7/6; patient presents for 1 week follow-up. She states that the compression wrap rolled down her leg 4 days ago. She has been  trying to keep the area covered but has no dressings at home to use. She denies signs of infection. Electronic Signature(s) Signed: 05/10/2021 11:14:01 AM By: Kalman Shan DO Entered By: Kalman Shan on 05/10/2021 11:11:04 Lanius, [name] ([id number]) -------------------------------------------------------------------------------- Physical Exam Details Patient Name: KAMDEN, REBER. Date of Service: 05/10/2021 10:00 AM Medical Record Number: [id number] Patient Account Number: [id number] Date of Birth/Sex: Jul 18, 1946 (75 y.o. F) Treating RN: Dolan Amen Primary Care Provider:  [name] Other Clinician: Referring Provider: [name] Treating Provider/Extender: Yaakov Guthrie in Treatment: 4 Constitutional . Cardiovascular . Psychiatric . Notes Left lower extremity: To the posterior aspect there is an open wound with nonviable tissue present. No signs of infection Electronic Signature(s) Signed: 05/10/2021 11:14:01 AM By: Kalman Shan DO Entered By: Kalman Shan on 05/10/2021 11:11:48 [name], [name] ([id number]) -------------------------------------------------------------------------------- Physician Orders Details Patient Name: [name], [name]. Date of Service: 05/10/2021 10:00 AM Medical Record Number: [id number] Patient Account Number: [id number] Date of Birth/Sex: 1946/10/09 (75 y.o. F) Treating RN: Carlene Coria Primary Care Provider: [name] Other Clinician: Referring Provider: [name] Treating Provider/Extender: Yaakov Guthrie in Treatment: 4 Verbal / Phone Orders: No Diagnosis Coding Follow-up Appointments Wound #13 Left,Distal,Posterior Lower Leg o Return Appointment in 1 week. o Nurse Visit as needed Bathing/ Shower/ Hygiene o May shower with wound dressing protected with water repellent cover or cast protector. Anesthetic (Use 'Patient Medications' Section for Anesthetic Order Entry) o Lidocaine  applied to wound bed Edema Control - Lymphedema / Segmental Compressive Device / Other o Elevate, Exercise Daily and Avoid Standing for Long Periods of Time. o Elevate legs to the level of the heart and pump ankles as often as possible o Elevate leg(s) parallel to the floor when sitting. o Compression Pump: Use compression pump on left lower extremity for 60 minutes, twice daily. - TWICE DAILY for 1 hour o Compression Pump: Use compression pump on right lower extremity for 60 minutes, twice daily. - TWICE DAILY for 1 hour Wound Treatment Wound #13 - Lower Leg Wound Laterality: Left, Posterior, Distal Cleanser: Soap and Water 1 x Per Week/30 Days Discharge Instructions: Gently cleanse wound with antibacterial soap, rinse and pat dry prior to dressing wounds Primary Dressing: Iodosorb 40 (g) 1 x Per Week/30 Days Discharge Instructions: Apply IodoSorb to wound bed only as directed. Secondary Dressing: ABD Pad 5x9 (in/in) 1 x Per Week/30 Days Discharge Instructions: Cover with ABD pad Compression Wrap: Profore Lite LF 3 Multilayer Compression Bandaging System 1 x Per Week/30 Days Discharge Instructions: Apply 3 multi-layer wrap as prescribed. Electronic Signature(s) Signed: 05/10/2021 11:14:01 AM By: Kalman Shan DO Signed: 05/11/2021 10:01:55 PM By: Carlene Coria RN Entered By: Carlene Coria on 05/10/2021 11:02:15 [name], [name] ([id number]) -------------------------------------------------------------------------------- Problem List Details Patient Name: [name], [name]. Date of Service: 05/10/2021 10:00 AM Medical Record Number: [id number] Patient Account Number: [id number] Date of Birth/Sex: 04/08/1946 (75 y.o. F) Treating RN: Dolan Amen Primary Care Provider: [name] Other Clinician: Referring Provider: [name] Treating Provider/Extender: Yaakov Guthrie in Treatment: 4 Active Problems ICD-10 Encounter Code Description Active Date  MDM Diagnosis I87.332 Chronic venous hypertension (idiopathic) with ulcer and inflammation of 04/12/2021 No Yes left lower extremity I89.0 Lymphedema, not elsewhere classified 04/12/2021 No Yes L97.221 Non-pressure chronic ulcer of left calf limited to breakdown of skin 04/12/2021 No Yes Inactive Problems Resolved Problems Electronic Signature(s) Signed: 05/10/2021 11:14:01 AM By: Kalman Shan DO Entered By: Kalman Shan on 05/10/2021 11:09:41 [name], [name] ([id number]) -------------------------------------------------------------------------------- Progress Note/History and Physical Details Patient Name: [name], [name]. Date of Service: 05/10/2021 10:00 AM Medical Record Number: [id number] Patient Account Number: [id number] Date of Birth/Sex: [birth year] (75 y.o. F) Treating RN: Dolan Amen Primary Care Provider: [name] Other Clinician: Referring Provider: [name] Treating Provider/Extender: Yaakov Guthrie in Treatment: 4 Subjective Chief Complaint Information obtained from Patient Left calf venous stasis ulceration. 11/13/16; the patient is here for a left calf recurrent ulceration which is painful and  has been present for the last month 01/22/17; the patient re-presents here for recurrent difficulties with blistering and leaking fluid on her left posterior calf 12/10/18; the patient returns to clinic for a wound on the left medial calf 04/12/2021; left lower extremity wound History of Present Illness (HPI) Pleasant 75 year old with history of chronic venous insufficiency. No diabetes or peripheral vascular disease. Left ABI 1.29. Questionable history of left lower extremity DVT. She developed a recurrent ulceration on her left lateral calf in December 2015, which she attributes to poor diet and subsequent lower extremity edema. She underwent endovenous laser ablation of her left greater saphenous vein in 2010. She underwent laser ablation of accessory branch  of left GSV in April 2016 by Dr. Kellie Simmering at [facility]. She was previously wearing Unna boots, which she tolerated well. Tolerating 2 layer compression and cadexomer iodine. She returns to clinic for follow-up and is without new complaints. She denies any significant pain at this time. She reports persistent pain with pressure. No claudication or ischemic rest pain. No fever or chills. No drainage. READMISSION 11/13/16; this is a 75 year old woman who is not a diabetic. She is here for a review of a painful area on her left medial lower extremity. I note that she was seen here previously last year for wound I believe to be in the same area. At that time she had undergone previously a left greater saphenous vein ablation by Dr. Kellie Simmering and she had a ablation of the anterior accessory branch of the left greater saphenous vein in March 2016. Seeing that the wound actually closed over. In reviewing the history with her today the ulcer in this area has been recurrent. She describes a biopsy of this area in 2009 that only showed stasis physiology. 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 Do not see that she has ever had arterial studies in the left leg. She tells me she has a follow-up with Dr. Kellie Simmering in roughly 10 days In any case she developed the reopening of this area roughly a month ago. On the background of this she describes rapidly increasing edema which has responded to Lasix 40 mg and metolazone 2.5 mg as well as the patient's lymph massage. She has been told she has both venous insufficiency and lymphedema but she cannot tolerate compression stockings 11/28/16; the patient saw Dr. Kellie Simmering recently. Per the patient he did arterial Dopplers in the office that did not show evidence of  arterial insufficiency, per the patient he stated "treat this like an ordinary venous ulcer". She also saw her dermatologist Dr. Ronnald Ramp who felt that this was more of a vascular ulcer. In general things are improving although she arrives today with increasing bilateral lower extremity edema with weeping a deeper fluid through the wound on the left medial leg compatible with some degree of lymphedema 12/04/16; the patient's wound is fully epithelialized but I don't think fully healed. We will do another week of depression with Promogran and TCA however I suspect we'll be able to discharge her next week. This is a very unusual-looking wound which was initially a figure-of-eight type wound lying on its side surrounded by petechial like hemorrhage. She has had venous ablation on this side. She apparently does not have an arterial issue per  Dr. Kellie Simmering. She saw her dermatologist thought it was "vascular". Patient is definitely going to need ongoing compression and I talked about this with her today she will go to elastic therapy after she leaves here next week 12/11/16; the patient's wound is not completely closed today. She has surrounding scar tissue and in further discussion with the patient it would appear that she had ulcers in this area in 2009 for a prolonged period of time ultimately requiring a punch biopsy of this area that only showed venous insufficiency. I did not previously pickup on this part of the history from the patient. 12/18/16; the patient's wound is completely epithelialized. There is no open area here. She has significant bilateral venous insufficiency with secondary lymphedema to a mild-to-moderate degree she does not have compression stockings.. She did not say anything to me when I was in the room, she told our intake nurse that she was still having pain in this area. This isn't unusual recurrent small open area. She is going to go to elastic therapy to obtain compression  stockings. 12/25/16; the patient's wound is fully epithelialized. There is no open area here. The patient describes some continued episodic discomfort in this area medial left calf. However everything looks fine and healed here. She is been to elastic therapy and caught herself 15-20 mmHg stockings, they apparently were having trouble getting 20-30 mm stockings in her size 01/22/17; this is a patient we discharged from the clinic a month ago. She has a recurrent open wound on her medial left calf. She had 15 mm support stockings. I told her I thought she needed 20-30 mm compression stockings. She tells me that she has been ill with hospitalization secondary to asthma and is been found to have severe hypokalemia likely secondary to a combination of Lasix and metolazone. This morning she noted blistering and leaking fluid on the posterior part of her left leg. She called our intake nurse urgently and we was saw her this afternoon. She has not had any real discomfort here. I don't know that she's been wearing any stockings on this leg for at least 2-3 days. ABIs in this clinic were 1.21 on the right and 1.3 on the left. She is previously seen vascular surgery who does not think that there is a peripheral arterial issue. 01/30/17; Patient arrives with no open wound on the left leg. She has been to elastic therapy and obtained 20-68mmhg below knee stockings and she has one on the right leg today. READMISSION 02/19/18; this Massaro is a now 75 year old patient we've had in this clinic perhaps 3 times before. I had last looked at her from January 07 December 2016 with an area on the medial left leg. We discharged her on 12/25/16 however she had to be readmitted on 01/22/17 with a recurrence. I have in my notes that we discharged her on 20-30 mm stockings although she tells me she was only wearing support hose because she cannot get stockings on predominantly related to her cervical spine surgery/issues. She has had  previous ablations done by vein and vascular in [place] including a great saphenous vein ablation on the left with an anterior accessory branch ablation I think both of these were in 2016. On one of the previous visit she had a biopsy noted 2009 that was negative. She is not felt to have an arterial issue. She is not a diabetic. She does have a LAURIA, DEPOY. ([id number]) history of obstructive sleep apnea hypertension asthma as well as  chronic venous insufficiency and lymphedema. On this occasion she noted 2 dry scaly patch on her left leg. She tried to put lotion on this it didn't really help. There were 2 open areas.the patient has been seeing her primary physician from 02/05/18 through 02/14/18. She had Unna boots applied. The superior wound now on the lateral left leg has closed but she's had one wound that remains open on the lateral left leg. This is not the same spot as we dealt with in 2018. ABIs in this clinic were 1.3 bilaterally 02/26/18; patient has a small wound on the left lateral calf. Dimensions are down. She has chronic venous insufficiency and lymphedema. 03/05/18; small open area on the left lateral calf. Dimensions are down. Tightly adherent necrotic debris over the surface of the wound which was difficult to remove. Also the dressing [over collagen] stuck to the wound surface. This was removed with some difficulty as well. Change the primary dressing to Hydrofera Blue ready 03/12/18; small open area on the left lateral calf. Comes in with tightly adherent surface eschar as well as some adherent Hydrofera Blue. 03/19/18; open area on the left lateral calf. Again adherent surface eschar as well as some adherent Hydrofera Blue nonviable subcutaneous tissue. She complained of pain all week even with the reduction from 4-3 layer compression I put on last week. Also she had an increase in her ankle and calf measurements probably related to the same thing. 03/26/18; open area on the left  lateral calf. A very small open area remains here. We used silver alginate starting last week as the Hydrofera Blue seem to stick to the wound bed. In using 4-layer compression 04/02/18; the open area in the left lateral calf at some adherent slough which I removed there is no open area here. We are able to transition her into her own compression stocking. Truthfully I think this is probably his support hose. However this does not maintain skin integrity will be limited. She cannot put over the toe compression stockings on because of neck problems hand problems etc. She is allergic to the lining layer of juxta lites. We might be forced to use extremitease stocking should this fail READMIT 11/24/2018 Patient is now a 75 year old woman who is not a diabetic. She has been in this clinic on at least 3 previous occasions largely with recurrent wounds on her left leg secondary to chronic venous insufficiency with secondary lymphedema. Her situation is complicated by inability to get stockings on and an allergy to neoprene which is apparently a component and at least juxta lites and other stockings. As a result she really has not been wearing any stockings on her legs. She tells [place] that roughly 2 or 3 weeks ago she started noticing a stinging sensation just above her ankle on the left medial aspect. She has been diagnosed with pseudogout and she wondered whether this was what she was experiencing. She tried to dress this with something she bought at the store however subsequently it pulled skin off and now she has an open wound that is not improving. She has been using Vaseline gauze with a cover bandage. She saw her primary doctor last week who put an Haematologist on her. ABIs in this clinic was 1.03 on the left 2/12; the area is on the left medial ankle. Odd-looking wound with what looks to be surface epithelialization but a multitude of small petechial openings. This clearly not closed yet. We have been  using silver alginate under 3  layer compression with TCA 2/19; the wound area did not look quite as good this week. Necrotic debris over the majority of the wound surface which required debridement. She continues to have a multitude of what looked to be small petechial openings. She reminds [place] that she had a biopsy on this initially during her first outbreak in 2015 in [place] dermatology. She expresses concern about this being a possible melanoma. 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 I assured her that this does not look anything like melanoma. Except for the petechial reaction it does look like a venous insufficiency area and she certainly has evidence of this on both sides 2/26; a difficult area on the left medial ankle. The patient clearly has chronic venous hypertension with some degree of lymphedema. The odd thing about the area is the small petechial hemorrhages. I am not really sure how to explain this. This was present last time and this is not a compression injury. We have been using Hydrofera Blue which I changed to last week 3/4; still using Hydrofera Blue. Aggressive debridement today. She does not have known arterial issues. She has seen Dr. Kellie Simmering at [facility] vein and vascular and and has an ablation on the left. [Anterior accessory branch of the greater saphenous]. From what I remember they did not feel she had an arterial issue. The patient has had this area biopsied in 2009 at [facility] dermatology and by her recollection they said this was "stasis". She is also follow-up with dermatology locally who thought that this was more of a vascular issue 3/11; using Hydrofera Blue. Aggressive debridement today. She does not have an arterial issue. We are using 3 layer compression although we may need to go to 4. The patient has been in for multiple changes to her wrap since I last saw her a  week ago. She says that the area was leaking. I do not have too much more information on what was found 01/19/19 on evaluation today patient was actually being seen for a nurse visit when unfortunately she had the area on her left lateral lower extremity as well as weeping from the right lower extremity that became apparent. Therefore we did end up actually seeing her for a full visit with myself. She is having some pain at this site as well but fortunately nothing too significant at this point. No fevers, chills, nausea, or vomiting noted at this time. 3/18-Patient is back to the clinic with the left leg venous leg ulcer, the ulcer is larger in size, has a surface that is densely adherent with fibrinous tissue, the Hydrofera Blue was used but is densely adherent and there was difficulty in removing it. The right lower extremity was also wrapped for weeping edema. Patient has a new area over the left lateral foot above the malleolus that is small and appears to have no debris with intact surrounding skin. Patient is on increased dose of Lasix also as a means to edema management 3/25; the patient has a nonhealing venous ulcer on the medial left leg and last week developed a smaller area on the lateral left calf. We have been using Hydrofera Blue with a contact layer. 4/1; no major change in these wounds areas. Left medial and more recently left lateral calf. I tried Iodoflex last week to aid in debridement she did not tolerate this. She stated her pain was terrible all week. She took the top  layer of the 4 layer compression off. 4/8; the patient actually looks somewhat better in terms of her more prominent left lateral calf wound. There is some healthy looking tissue here. She is still complaining of a lot of discomfort. 4/15; patient in a lot of pain secondary to sciatica. She is on a prednisone taper prescribed by her primary physician. She has the 2 areas one on the left medial and more recently a  smaller area on the left lateral calf. Both of these just above the malleoli 4/22; her back pain is better but she still states she is very uncomfortable and now feels she is intolerant to the [employer]. No real change in the wounds we have been using Sorbact. She has been previously intolerant to Iodoflex. There is not a lot of option about what we can use to debride this wound under compression that she no doubt needs. sHe states Ultram no longer works for her pain 4/29; no major change in the wounds slightly increased depth. Surface on the original medial wound perhaps somewhat improved however the more recent area on the lateral left ankle is 100% covered in very adherent debris we have been using Sorbact. She tolerates 4 layer compression well and her edema control is a lot better. She has not had to come in for a nurse check 5/6; no major change in the condition of the wounds. She did consent to debridement today which was done with some difficulty. Continuing Sorbact. She did not tolerate Iodoflex. She was in for a check of her compression the day after we wrapped her last week this was adjusted but LACREASHA, HINDS. ([id number]) nothing much was found 5/13; no major change in the condition or area of the wounds. I was able to get a fairly aggressive debridement done on the lateral left leg wound. Even using Sorbact under compression. She came back in on Friday to have the wrap changed. She says she felt uncomfortable on the lateral aspect of her ankle. She has a long history of chronic venous insufficiency including previous ablation surgery on this side. 5/20-Patient returns for wounds on left leg with both wounds covered in slough, with the lateral leg wound larger in size, she has been in 3 layer compression and felt more comfortable, she describes pain in ankle, in leg and pins and needles in foot, and is about to try Pamelor for this 6/3; wounds on the left lateral and left medial leg. The  area medially which is the most recent of the 2 seems to have had the largest increase in dimensions. We have been using Sorbac to try and debride the surface. She has been to see orthopedics they apparently did a plain x-ray that was indeterminant. Diagnosed her with neuropathy and they have ordered an MRI to determine if there is underlying osteomyelitis. This was not high on my thought list but I suppose it is prudent. We have advised her to make an appointment with vein and vascular in [place]. She has a history of a left greater saphenous and accessory vein ablations I wonder if there is anything else that can be done from a surgical point of view to help in these difficult refractory wounds. We have previously healed this wound on one occasion but it keeps on reopening [medial side] 6/10; deep tissue culture I did last week I think on the left medial wound showed both moderate E. coli and moderate staph aureus [MSSA]. She is going to require antibiotics and  I have chosen Augmentin. We have been using Sorbact and we have made better looking wound surface on both sides but certainly no improvement in wound area. She was back in last Friday apparently for a dressing changes the wrap was hurting her outer left ankle. She has not managed to get a hold of vein and vascular in [place]. We are going to have to make her that appointment 6/17; patient is tolerating the Augmentin. She had an MRI that I think was ordered by orthopedic surgeon this did not show osteomyelitis or an abscess did suggest cellulitis. We have been using Sorbact to the lateral and medial ankles. We have been trying to arrange a follow-up appointment with vein and vascular in [place] or did her original ablations. We apparently an area sent the request to vein and vascular in [facility] 6/24; patient has completed the Augmentin. We do not yet have a vein and vascular appointment in [place]. I am not sure what the issue  is here we have asked her to call tomorrow. We are using Sorbact. Making some improvements and especially the medial wound. Both surfaces however look better medial and lateral. 7/1; the patient has been in contact with vein and vascular in [place] but has not yet received an appointment. Using Sorbact we have gradually improve the wound surface with no improvement in surface area. She is approved for Apligraf but the wound surface still is not completely viable. She has not had to come in for a dressing change 7/8; the patient has an appointment with vein and vascular on 7/31 which is a Friday afternoon. She is concerned about getting back here for [place] to dress her wounds. I think it is important to have them goal for her venous reflux/history of ablations etc. to see if anything else can be done. She apparently tested positive for 1 of the blood tests with regards to lupus and saw a rheumatologist. He has raised the issue of vasculitis again. I have had this thought in the past however the evidence seems overwhelming that this is a venous reflux etiology. If the rheumatologist tells me there is clinical and laboratory investigation is positive for lupus I will rethink this. 7/15; the patient's wound surfaces are quite a bit better. The medial area which was her original wound now has no depth although the lateral wound which was the more recent area actually appears larger. Both with viable surfaces which is indeed better. Using Sorbact. I wanted to use Apligraf on her however there is the issue of the vein and vascular appointment on 7/31 at 2:00 in the afternoon which would not allow her to get back to be rewrapped and they would no doubt remove the graft 7/22; the patient's wound surfaces have moderate amount of debris although generally look better. The lateral one is larger with 2 small satellite areas superiorly. We are waiting for her vein and vascular appointment on 7/31. She has been  approved for Apligraf which I would like to use after th 7/29; wound surfaces have improved no debridement is required we have been using Sorbact. She sees vein and vascular on Friday with this so question of whether anything can be done to lessen the likelihood of recurrence and/or speed the healing of these areas. She is already had previous ablations. She no doubt has severe venous hypertension 8/5-Patient returns at 1 week, she was in [employer] [facility] for 3 days by her podiatrist, we have been using so backed to the wound, she  has increased pain in both the wounds on the left lower leg especially the more distal one on the lateral aspect 8/12-Patient returns at 1 week and she is agreeable to having debridement in both wounds on her left leg today. We have been using Sorbact, and vascular studies were reviewed at last visit 8/19; the patient arrives with her wounds fairly clean and no debridement is required. We have used Sorbact which is really done a nice job in cleaning up these very difficult wound surfaces. The patient saw Dr. Donzetta Matters of vascular surgery on 7/31. He did not feel that there was an arterial component. He felt that her treated greater saphenous vein is adequately addressed and that the small saphenous vein did not appear to be involved significantly. She was also noted to have deep venous reflux which is not treatable. Dr. Donzetta Matters mentioned the possibility of a central obstructive component leading to reflux and he offered her central venography. She wanted to discuss this or think about it. I have urged her to go ahead with this. She has had recurrent difficult wounds in these areas which do heal but after months in the clinic. If there is anything that can be done to reduce the likelihood of this I think it is worth it. 9/2 she is still working towards getting follow-up with Dr. Donzetta Matters to schedule her CT. Things are quite a bit worse venography. I put Apligraf on 2 weeks ago on both wounds  on the medial and lateral part of her left lower leg. She arrives in clinic today with 3 superficial additional wounds above the area laterally and one below the wound medially. She describes a lot of discomfort. I think these are probably wrapped injuries. Does not look like she has cellulitis. 07/20/2019 on evaluation today patient appears to be doing somewhat poorly in regard to her lower extremity ulcers. She in fact showed signs of erythema in fact we may even be dealing with an infection at this time. Unfortunately I am unsure if this is just infection or if indeed there may be some allergic reaction that occurred as a result of the Apligraf application. With that being said that would be unusual but nonetheless not impossible in this patient is one who is unfortunately allergic to quite a bit. Currently we have been using the Sorbact which seems to do as well as anything for her. I do think we may want to obtain a culture today to see if there is anything showing up there that may need to be addressed. 9/16; noted that last week the wounds look worse in 1 week follow-up of the Apligraf. Using Sorbact as of 2 days ago. She arrives with copious amounts of drainage and new skin breakdown on the back of the left calf. The wounds arm more substantial bilaterally. There is a fair amount of swelling in the left calf no overt DVT there is edema present I think in the left greater than right thigh. She is supposed to go on 9/28 for CT venography. The wounds on the medial and lateral calf are worse and she has new skin breakdown posteriorly at least new for me. This is almost developing into a circumferential wound area The Apligraf was taken off last week which I agree with things are not going in the right direction a culture was done we do not have that back yet. She is on Augmentin that she started 2 days ago 9/23; dressing was changed by her nurses on Monday.  In general there is no improvement in the  wound areas although the area looks less angry than last week. She did get Augmentin for MSSA cultured on the 14th. She still appears to have too much swelling in the left leg even with 3 layer compression 9/30; the patient underwent her procedure on 9/28 by Dr. Donzetta Matters at vascular and vein specialist. She was discovered to have the common iliac vein measuring 12.2 mm but at the level of L4-L5 measured 3 mm. After stenting it measured 10 mm. 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 Rouleaux flow in the common femoral and femoral vein was observed much improved after stenting. We are using silver alginate to the wounds on the medial and lateral ankle on the left. 4 layer compression IMMACULATE, CRUTCHER ([id number]) 10/7; the patient had fluid swelling around her knee and 4 layer compression. At the advice of vein and vascular this was reduced to 3 layer which she is tolerating better. We have been using silver alginate under 3 layer compression since last Friday 10/14; arrives with the areas on the left ankle looking a lot better. Inflammation in the area also a lot better. She came in for a nurse check on 10/9 10/21; continued nice improvement. Slight improvements in surface area of both the medial and lateral wounds on the left. A lot of the satellite lesions in the weeping erythema around these from stasis dermatitis is resolved. We have been using silver alginate 10/28; general improvement in the entire wound areas although not a lot of change in dimensions the wound certainly looks better. There is a lot less in terms of venous inflammation. Continue silver alginate this week however look towards Hydrofera Blue next week 11/4; very adherent debris on the medial wound left wound is not as bad. We have been using silver alginate. Change to [facility] today 11/11; very adherent debris on both wound areas. She went to vein and vascular last week and follow-up they put in [place] boot on  this today. He says the [facility] was adherent. Wound is definitely not as good as last week. Especially on the left there the satellite lesions look more prominent 11/18; absolutely no better. erythema on lateral aspect with tenderness. 09/30/2019 on evaluation today patient appears to actually be doing better. Dr. Dellia Nims did put her on doxycycline last week which I do believe has helped her at this point. Fortunately there is no signs of active infection at this time. No fevers, chills, nausea, vomiting, or diarrhea. I do believe he may want extend the doxycycline for 7 additional days just to ensure everything does completely cleared up the patient is in agreement with that plan. Otherwise she is going require some sharp debridement today 12/2; patient is completing a 2-week course of doxycycline. I gave her this empirically for inflammation as well as infection when I last saw her 2 weeks ago. All of this seems to be better. She is using silver alginate she has the area on the medial aspect of the larger area laterally and the 2 small satellite regions laterally above the major wound. 12/9; the patient's wound on the left medial and left lateral calf look really quite good. We have been using silver alginate. She saw vein and vascular in follow-up on 10/09/2019. She has had a previous left greater saphenous vein ablation by Dr. Oscar La in 2016. More recently she underwent a left common iliac vein stent by Dr. Donzetta Matters on 08/04/2019 due to  May Thurner type lesions. The swelling is improved and certainly the wounds have improved. The patient shows [place] today area on the right medial calf there is almost no wound but leaking lymphedema. She says she start this started 3 or 4 days ago. She did not traumatize it. It is not painful. She does not wear compression on that side 12/16; the patient continues to do well laterally. Medially still requiring debridement. The area on the right calf did not  materialize to anything and is not currently open. We wrapped this last time. She has support stockings for that leg although I am not sure they are going to provide adequate compression 12/23; the lateral wound looks stable. Medially still requiring debridement for tightly adherent fibrinous debris. We've been using silver alginate. Surface area not any different 12/30; neither wound is any better with regards to surface and the area on the left lateral is larger. I been using silver alginate to the left lateral which look quite good last week and Sorbact to the left medial 11/11/2019. Lateral wound area actually looks better and somewhat smaller. Medial still requires a very aggressive debridement today. We have been using Sorbact on both wound areas 1/13; not much better still adherent debris bilaterally. I been using Sorbact. She has severe venous hypertension. Probably some degree of dermal fibrosis distally. I wonder whether tighter compression might help and I am going to try that today. We also need to work on the bioburden 1/20; using Sorbact. She has severe venous hypertension status post stent placement for pelvic vein compression. We applied gentamicin last time to see if we could reduce bioburden I had some discussion with her today about the use of pentoxifylline. This is occasionally used in this setting for wounds with refractory venous insufficiency. However this interacts with Plavix. She tells me that she was put on this after stent placement for 3 months. She will call Dr. Claretha Cooper office to discuss 1/27; we are using gentamicin under Sorbact. 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 She has a stent. Wound medially is measuring smaller this week. Laterally measuring slightly larger although she has some satellite lesions superiorly 2/3; gentamicin under Sorbact under 4-layer compression. 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 She has a stent on Plavix. Her wounds are measuring smaller this week. More substantially laterally where there is a satellite lesion superiorly. 2/10; gentamicin under Sorbac. 4-layer compression. Patient communicated with Dr. Donzetta Matters at vein and vascular in [place]. He is okay with the patient coming off Plavix I will therefore start her on pentoxifylline for a 1 month trial. In general her wounds look better today. I had some concerns about swelling in the left thigh however she measures 61.5 on the right and 63 on the mid thigh which does not suggest there is any difficulty. The patient is not describing any pain. 2/17; gentamicin under Sorbac 4-layer compression. She has been on pentoxifylline for 1 week and complains of loose stool. No nausea she is eating and drinking well 2/24; the patient apparently came in 2 days ago for a nurse visit when her wrap fell down. Both areas look a little worse this week macerated medially and satellite lesions laterally. Change to silver alginate today 3/3; wounds are larger today especially medially. She also has more swelling in her foot lower leg and I even noted some swelling in her posterior thigh which is tender. I wonder about the patency of her stent. Fortuitously she sees  Dr. Claretha Cooper group on Friday 3/10; Mrs. Ige was seen by vein and vascular on 3/5. The patient underwent ultrasound. There was no evidence of thrombosis involving the IVC no evidence of thrombosis involving the right common iliac vein there is no evidence of thrombosis involving the right external iliac vein the left external vein is also patent. The right common iliac vein stent appears patent bilateral common femoral veins are compressible and appear patent. I was concerned about the left common iliac stent however it looks like this is functional. She has some edema in the posterior thigh that was tender she still has that this week. I also note they had trouble  finding the pulses in her left foot and booked her for an ABI baseline in 4 weeks. She will follow up in 6 months for repeat IVC duplex. The patient stopped the pentoxifylline because of diarrhea. It does not look like that was being effective in any case. I have advised her to go back on her aspirin 81 mg tablet, vascular it also suggested this 3/17; comes in today with her wound surfaces a lot better. The excoriations from last week considerably better probably secondary to the TCA. We have been using silver alginate 3/24; comes in today with smaller wounds both medially and laterally. Both required debridement. There are 2 small satellite areas superiorly laterally. She also has a very odd bandlike area in the mid calf almost looking like there was a weakness in the wrap in a localized area. I would write this off as being this however anteriorly she has a small raised ballotable area that is very tender almost reminiscent of an abscess but there was no HARLEM, THRESHER. ([id number]) obvious purulent surface to it. 02/04/20 upon evaluation today patient appears to be doing fairly well in regard to her wounds today. Fortunately there is no signs of active infection at this time. No fevers, chills, nausea, vomiting, or diarrhea. She has been tolerating the dressing changes without complication. Fortunately I feel like she is showing signs of improvement although has been sometime since have seen her. Nonetheless the area of concern that Dr. Dellia Nims had last week where she had possibly an area of the wrap that was we can allow the leg to bulge appears to be doing significantly better today there is no signs of anything worsening. 4/7; the patient's wounds on her medial and lateral left leg continue to contract. We have been using a regular alginate. Last week she developed an area on the right medial lower leg which is probably a venous ulcer as well. 4/14; the wounds on her left medial and lateral  lower leg continue to contract. Surface eschar. We have been using regular alginate. The area on the right medial lower leg is closed. We have been putting both legs under 4-layer contraction. The patient went back to see vein and vascular she had arterial studies done which were apparently "quite good" per the patient although I have not read their notes I have never felt she had an arterial issue. 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 This is been longstanding and refractory to exercise, leg elevation and longstanding use of compression wraps in our clinic as well as compression stockings on the times we have been able to get these to heal 4/21; we thought she actually might be close this week however she arrives in clinic with a lot of edema in her upper left calf and into her posterior thigh.  This is been an intermittent problem here. She says the wrap fell down but it was replaced with a nurse visit on Monday. We are using calcium alginate to the wounds and the wound sizes there not terribly larger than last week but there is a lot more edema 4/28; again wound edges are smaller on both sides. Her edema is better controlled than last time. She is obtained her compression pumps from [employer] although they have not been to her home to set these up. 5/5; left medial and left lateral both look stable. I am not sure the medial is any smaller. We have been using calcium alginate under 4-layer compression. She had an area on the right medial. This was eschared today. We have been wrapping this as well. She does not tolerate external compression stockings due to a history of various contact allergies. She has her compression pumps however the representative from the company is coming on her to show her how to use these tomorrow 5/19; patient with severe chronic venous insufficiency secondary to central venous disease. She had a stent placed in  her left common iliac vein. She has done better since but still difficult to control wounds. She comes in today with nothing open on the right leg. Her areas on the left medial and left lateral are just about closed. We are using calcium alginate under 4-layer compression. She is using her external compression pumps at home She only has 15-20 support stockings. States she cannot get anything tighter than that on. 03/30/20-Patient returns at 1 week, the wounds on the left leg are both slightly bigger, the last week she was on 3 layer compression which started to slide down. She is starting to use her lymphedema pumps although she stated on 1 day her right ankle started to swell up and she have to stop that day. Unfortunately the open area seem to oscillate between improving to the point of healing and then flaring up all to do with effectiveness of compression or lack of due to the left leg topography not keeping the compression wraps from rolling down 6/2 patient comes in with a 15/20 mmHg stocking on the right leg. She tells me that she developed a lot of swelling in her ankles she saw orthopedics she was felt to possibly be having a flare of pseudogout versus some other type of arthritis. She was put on steroids for a respiratory issue so that helps with the inflammation. She has not been using the pumps all week. She thinks the left thigh is more swollen than usual and I would agree with that. She has an appointment with Dr. Donzetta Matters 9 days or so from now 6/9; both wounds on the left medial and left lateral are smaller. We have been using calcium alginate under compression. She does not have an open wound on the right leg she is using a stocking and her compression pumps things are going well. She has an appointment with Dr. Donzetta Matters with regards to her stent in the left common iliac vein 6/16; the wounds on the left medial and left lateral ankle continues to contract. The patient saw Dr. Donzetta Matters and I think  he seems satisfied. Ordered follow-up venous reflux studies on both sides in September. Cautioned that she may need thigh-high stockings. She has been using calcium alginate under compression on the left and her own stocking on the right leg. She tells [place] there are no open wounds on the right 6/23; left lateral is just  about closed. Medial required debridement today. We have been using calcium alginate. Extensive discussion about the compression pumps she is only using these on 25 mmHg states she could not take 40 or 30 when the wrap came out to her home to demonstrate these. He said they should not feel tight 6/30; the left lateral wound has a slight amount of eschar. . The area medially is about the same using Hydrofera Blue. 7/7; left lateral wound still has some eschar. I will remove this next week may be closed. The area medially is very small using Hydrofera Blue with improvement. Unfortunately the stockings fell down. Unfortunately the blisters have developed at the edge of where the wrap fell. When this happened she says her legs hurt she did not use her pumps. We are not open Monday for her to come in and change the wraps and she had an appointment yesterday. She also tells me that she is going to have an MRI of her back. She is having pain radiating into her left anterior leg she thinks her from an L5 disc. She saw Dr. Ellene Route of neurosurgery 7/14; the area on the left lateral ankle area is closed. Still a small area medially however it looks better as well. We have been using Hydrofera Blue under 4-layer compression 7/21; left lateral ankle is still closed however her wound on the medial left calf is actually larger. This is probably because Hydrofera Blue got stuck to the wound. She came in for a nurse change on Friday and will do that again this week I was concerned about the amount of swelling that she had last week however she is using her compression pumps twice a day and the swelling  seems well controlled 7/28; remaining wound on the left medial lower leg is smaller. We have been using moistened silver collagen under compression she is coming back for a nurse visit. For reasons that were not really clear she was just keeping her legs elevated and not using her compression pumps. I have asked her to use the compression pumps. She does not have any wounds on the right leg 06/15/20-Patient returns at 2 weeks, her LLE edema is worse and she developed a blister wound that is new and has bigger posterior calf wound on right, we are using Prisma with pad, 4 layer compression. she has been on lasix 40 mg daily 8/18; patient arrives today with things a lot worse than I remember from a few weeks ago. She was seen last week. Noted that her edema was worse and that she now had a left lateral wound as well as deteriorating edema in the medial and posterior part of the lower leg. She says she is using his or her external compression pumps once a day although I wonder about the compliance. 8/25; weeping area on the right medial lower leg. This had actually gotten a small localized area of her compression stocking wet. On the left side there is a large denuded area on the posterior medial lower leg and smaller area on the lateral. This was not the original areas that we dealt with. 9/1 the patient's wound on the left leg include the left lateral and left posterior. Larger superficial wounds weeping. She has very poor edema control. Tender localized edema in the left lower medial ankle/heel probably because of localized wrap issues. She freely admits she is not using [place] ([id number]) the compression pumps. She has been up on her feet a lot. She thinks  the hydrofera blue is contributing to the pain she is experiencing.. This is a complaint that I have occasionally heard 9/8; really not much improvement. The patient is still complaining of a lot of pain particularly when she uses  compression pumps. I switched her to silver alginate last time because she found the Hydrofera Blue to be irritating. I don't hear much difference in her description with the silver alginate. 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 She has follow-up with Dr. Donzetta Matters I think for ultrasound next month 9/15; quite a bit of improvement today. We have less edema and more epithelialization in both of her wound areas on the left medial and left lateral calf. These are not the site of her original wounds in this area. She says she has been using her compression pumps for 30 minutes twice a day, there pain issues that never quite understood. Silver alginate as the primary dressing 9/22; continued improvement. Both areas medially and laterally still have a small open area there is some eschar. She continues to complain of left medial ankle pain. Swelling in the leg is in much better condition. We have been using silver alginate 9/29; continued improvement. Both areas medially and laterally in the left calf look as though they are close some minor surface eschar but I think this is epithelialized. She comes in today saying she has a ruptured disc at L4-L5 cannot bend over to put on her stockings. 10/6; patient comes in today with no open wounds on either leg. However her edema on the left leg in the upper one third of the lower leg is poorly controlled nonpitting. She says that she could not use the pumps for 2 days and then she has been using the last couple of days. It is not clear to me she has been able to get her stocking on. She has back problems. Mrs. Harlacher has severe chronic venous insufficiency with secondary lymphedema. Her venous insufficiency is partially centrally mediated and that she is now post stent in the left common iliac vein. [facility] Thurner's syndrome/physiology]. She follows up with them on 10/15. She wears  20/30 below-knee stockings. She is supposed to use compression pumps at home although I think her compliance about with this is been less than 100%. I have asked her to use these 3 times a day. Finally I think she has lipodermatosclerosis in the left lower leg with an inverted bottle sign. It is been a major problem controlling the edema in the left leg. The right leg we have had wounds on but not as significant a problem is on the left READMISSION 04/12/2021 Mrs. Rominger is a 75 year old woman we know well in this clinic. She has severe chronic venous insufficiency. 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 I believe she has had bilateral greater saphenous vein ablation in the past as well. She tells me that this wound opened sometime in March. She had a fall and thinks it was initially abrasion. She developed areas she describes as little blisters on the anterior part of her leg and she saw dermatology and was treated for methicillin staph aureus with several rounds of antibiotics. She has been using support stockings on the left leg and says this is the only thing she can get on. Her compression pump use maybe once a day she says if she did  not use one she use the other. She comes in today with incredible swelling in the left leg with a wound on the left posterior calf. She has been using Neosporin to this previously a hydrocolloid. 6/15; patient arrives back for 1 week follow-up.Marland Kitchen Apparently her wrap fell down she did not call us to replace this. He has poor edema control. She only uses her compression pumps once a day 6/29; patient presents for 1 week follow-up. She has tolerated the compression wrap well. We have been using Iodoflex under the wrap. She has no issues or complaints today. 7/6; patient presents for 1 week follow-up. She states that the compression wrap rolled down her leg 4 days ago. She has been trying to keep the area covered but has no  dressings at home to use. She denies signs of infection. Patient History Information obtained from Patient. Family History Cancer - Mother,Paternal Grandparents, Hypertension - Mother,Father, Kidney Disease - Paternal Grandparents, Lung Disease - Paternal Grandparents, No family history of Diabetes, Heart Disease, Hereditary Spherocytosis, Seizures, Stroke, Thyroid Problems, Tuberculosis. Social History Former smoker - quit at age 63 - ended on 04/19/1966, Marital Status - Divorced, Alcohol Use - Never, Drug Use - No History, Caffeine Use - Daily. Medical History Eyes Patient has history of Cataracts Denies history of Glaucoma, Optic Neuritis Ear/Nose/Mouth/Throat Denies history of Chronic sinus problems/congestion, Middle ear problems Hematologic/Lymphatic Denies history of Anemia, Hemophilia, Human Immunodeficiency Virus, Lymphedema, Sickle Cell Disease Respiratory Patient has history of Asthma, Sleep Apnea Denies history of Aspiration, Chronic Obstructive Pulmonary Disease (COPD), Pneumothorax, Tuberculosis Cardiovascular Patient has history of Deep Vein Thrombosis - LLE 2010, Hypertension, Peripheral Venous Disease Denies history of Angina, Arrhythmia, Congestive Heart Failure, Coronary Artery Disease, Hypotension, Myocardial Infarction, Peripheral Arterial Disease, Phlebitis, Vasculitis Gastrointestinal Denies history of Cirrhosis , Colitis, Crohn s, Hepatitis A, Hepatitis B, Hepatitis C Endocrine Denies history of Type I Diabetes, Type II Diabetes Genitourinary Denies history of End Stage Renal Disease Immunological Denies history of Lupus Erythematosus, Raynaud s, Scleroderma Integumentary (Skin) Sava, Vetra J. ([id number]) Denies history of History of Burn, History of pressure wounds Musculoskeletal Patient has history of Osteoarthritis - neck Denies history of Gout, Rheumatoid Arthritis, Osteomyelitis Neurologic Denies history of Dementia, Neuropathy, Quadriplegia,  Paraplegia, Seizure Disorder Oncologic Patient has history of Received Chemotherapy - interfeon immunotherapy, Received Radiation Psychiatric Denies history of Anorexia/bulimia, Confinement Anxiety Hospitalization/Surgery History - Melanoma left shoulder. Medical And Surgical History Notes Constitutional Symptoms (General Health) Vein ablation LLE 2010 Vascular Sx ( vein ablationo) LLE 02/2015 Eyes Horners syndrome Genitourinary Kidney stones Neurologic ct scan showed swollen lymph nodes Oncologic Melanoma (R) shoulder 07/2010; (R) axillary lymphnode removal Objective Constitutional Vitals Time Taken: 10:13 AM, Height: 63 in, Weight: 212 lbs, BMI: 37.6, Temperature: 98.8 F, Pulse: 92 bpm, Respiratory Rate: 18 breaths/min, Blood Pressure: 171/84 mmHg. General Notes: Left lower extremity: To the posterior aspect there is an open wound with nonviable tissue present. No signs of infection Integumentary (Hair, Skin) Wound #13 status is Open. Original cause of wound was Gradually Appeared. The date acquired was: 02/24/2021. The wound has been in treatment 4 weeks. The wound is located on the Left,Distal,Posterior Lower Leg. The wound measures 3cm length x 2.5cm width x 0.1cm depth; 5.89cm^2 area and 0.589cm^3 volume. There is Fat Layer (Subcutaneous Tissue) exposed. There is no tunneling or undermining noted. There is a medium amount of serosanguineous drainage noted. There is small (1-33%) red, hyper - granulation within the wound bed. There is a large (67-100%) amount of necrotic  tissue within the wound bed including Adherent Slough. Assessment Active Problems ICD-10 Chronic venous hypertension (idiopathic) with ulcer and inflammation of left lower extremity Lymphedema, not elsewhere classified Non-pressure chronic ulcer of left calf limited to breakdown of skin Overall patient's wound is stable. I debrided nonviable tissue. No signs of infection on exam. We will continue with Iodoflex  under compression therapy. She is to call if the wrap comes down again and we can change this. Once surface is more clean I may try endoform. Procedures GILLIE, FLEITES ([id number]) Wound #13 Pre-procedure diagnosis of Wound #13 is a Venous Leg Ulcer located on the Left,Distal,Posterior Lower Leg .Severity of Tissue Pre Debridement is: Fat layer exposed. There was a Excisional Skin/Subcutaneous Tissue Debridement with a total area of 7.5 sq cm performed by Kalman Shan, MD. With the following instrument(s): Curette to remove Viable and Non-Viable tissue/material. Material removed includes Subcutaneous Tissue, Slough, Skin: Dermis, Skin: Epidermis, and Biofilm after achieving pain control using Lidocaine 4% Topical Solution. No specimens were taken. A time out was conducted at 10:49, prior to the start of the procedure. A Minimum amount of bleeding was controlled with Pressure. The procedure was tolerated well with a pain level of 0 throughout and a pain level of 0 following the procedure. Post Debridement Measurements: 3cm length x 2.5cm width x 0.1cm depth; 0.589cm^3 volume. Character of Wound/Ulcer Post Debridement is improved. Severity of Tissue Post Debridement is: Fat layer exposed. Post procedure Diagnosis Wound #13: Same as Pre-Procedure Plan Follow-up Appointments: Wound #13 Left,Distal,Posterior Lower Leg: Return Appointment in 1 week. Nurse Visit as needed Bathing/ Shower/ Hygiene: May shower with wound dressing protected with water repellent cover or cast protector. Anesthetic (Use 'Patient Medications' Section for Anesthetic Order Entry): Lidocaine applied to wound bed Edema Control - Lymphedema / Segmental Compressive Device / Other: Elevate, Exercise Daily and Avoid Standing for Long Periods of Time. Elevate legs to the level of the heart and pump ankles as often as possible Elevate leg(s) parallel to the floor when sitting. Compression Pump: Use compression pump on left  lower extremity for 60 minutes, twice daily. - TWICE DAILY for 1 hour Compression Pump: Use compression pump on right lower extremity for 60 minutes, twice daily. - TWICE DAILY for 1 hour WOUND #13: - Lower Leg Wound Laterality: Left, Posterior, Distal Cleanser: Soap and Water 1 x Per Week/30 Days Discharge Instructions: Gently cleanse wound with antibacterial soap, rinse and pat dry prior to dressing wounds Primary Dressing: Iodosorb 40 (g) 1 x Per Week/30 Days Discharge Instructions: Apply IodoSorb to wound bed only as directed. Secondary Dressing: ABD Pad 5x9 (in/in) 1 x Per Week/30 Days Discharge Instructions: Cover with ABD pad Compression Wrap: Profore Lite LF 3 Multilayer Compression Bandaging System 1 x Per Week/30 Days Discharge Instructions: Apply 3 multi-layer wrap as prescribed. 1. In office sharp debridement 2. Iodoflex under compression therapy 3. Follow-up in 1 week Electronic Signature(s) Signed: 05/10/2021 11:14:01 AM By: Kalman Shan DO Entered By: Kalman Shan on 05/10/2021 11:13:25 Brightbill, [name] ([id number]) -------------------------------------------------------------------------------- ROS/PFSH Details Patient Name: SHAQUINA, GILLHAM. Date of Service: 05/10/2021 10:00 AM Medical Record Number: [id number] Patient Account Number: [id number] Date of Birth/Sex: 04-25-1946 (75 y.o. F) Treating RN: Dolan Amen Primary Care Provider: [name] Other Clinician: Referring Provider: [name] Treating Provider/Extender: Yaakov Guthrie in Treatment: 4 Label Progress Note Print Version as History and Physical for this encounter Information Obtained From Patient Constitutional Symptoms (General Health) Medical History: Past Medical History Notes: Vein ablation LLE  2010 Vascular Sx ( vein ablationo) LLE 02/2015 Eyes Medical History: Positive for: Cataracts Negative for: Glaucoma; Optic Neuritis Past Medical History Notes: Horners  syndrome Ear/Nose/Mouth/Throat Medical History: Negative for: Chronic sinus problems/congestion; Middle ear problems Hematologic/Lymphatic Medical History: Negative for: Anemia; Hemophilia; Human Immunodeficiency Virus; Lymphedema; Sickle Cell Disease Respiratory Medical History: Positive for: Asthma; Sleep Apnea Negative for: Aspiration; Chronic Obstructive Pulmonary Disease (COPD); Pneumothorax; Tuberculosis Cardiovascular Medical History: Positive for: Deep Vein Thrombosis - LLE 2010; Hypertension; Peripheral Venous Disease Negative for: Angina; Arrhythmia; Congestive Heart Failure; Coronary Artery Disease; Hypotension; Myocardial Infarction; Peripheral Arterial Disease; Phlebitis; Vasculitis Gastrointestinal Medical History: Negative for: Cirrhosis ; Colitis; Crohnos; Hepatitis A; Hepatitis B; Hepatitis C Endocrine Medical History: Negative for: Type I Diabetes; Type II Diabetes Genitourinary Medical History: Negative for: End Stage Renal Disease Past Medical History Notes: Kidney stones TACEY, DIMAGGIO ([id number]) Immunological Medical History: Negative for: Lupus Erythematosus; Raynaudos; Scleroderma Integumentary (Skin) Medical History: Negative for: History of Burn; History of pressure wounds Musculoskeletal Medical History: Positive for: Osteoarthritis - neck Negative for: Gout; Rheumatoid Arthritis; Osteomyelitis Neurologic Medical History: Negative for: Dementia; Neuropathy; Quadriplegia; Paraplegia; Seizure Disorder Past Medical History Notes: ct scan showed swollen lymph nodes Oncologic Medical History: Positive for: Received Chemotherapy - interfeon immunotherapy; Received Radiation Past Medical History Notes: Melanoma (R) shoulder 07/2010; (R) axillary lymphnode removal Psychiatric Medical History: Negative for: Anorexia/bulimia; Confinement Anxiety HBO Extended History Items Eyes: Cataracts Immunizations Pneumococcal Vaccine: Received  Pneumococcal Vaccination: Yes Immunization Notes: tetanus shot w/in the last 5 years per pt Implantable Devices None Hospitalization / Surgery History Type of Hospitalization/Surgery Melanoma left shoulder Family and Social History Cancer: Yes - Mother,Paternal Grandparents; Diabetes: No; Heart Disease: No; Hereditary Spherocytosis: No; Hypertension: Yes - Mother,Father; Kidney Disease: Yes - Paternal Grandparents; Lung Disease: Yes - Paternal Grandparents; Seizures: No; Stroke: No; Thyroid Problems: No; Tuberculosis: No; Former smoker - quit at age 68 - ended on 04/19/1966; Marital Status - Divorced; Alcohol Use: Never; Drug Use: No History; Caffeine Use: Daily; Financial Concerns: No; Food, Clothing or Shelter Needs: No; Support System Lacking: No; Transportation Concerns: No Electronic Signature(s) Signed: 05/10/2021 11:14:01 AM By: Kalman Shan DO Signed: 05/10/2021 5:07:08 PM By: Dolan Amen RN Entered By: Kalman Shan on 05/10/2021 11:11:12 ASHEY, TRAMONTANA ([id number]) -------------------------------------------------------------------------------- SuperBill Details Patient Name: ALZINA, GOLDA. Date of Service: 05/10/2021 Medical Record Number: [id number] Patient Account Number: [id number] Date of Birth/Sex: 1946-07-29 (75 y.o. F) Treating RN: Dolan Amen Primary Care Provider: [name] Other Clinician: Referring Provider: [name] Treating Provider/Extender: Yaakov Guthrie in Treatment: 4 Diagnosis Coding ICD-10 Codes Code Description [phone] Chronic venous hypertension (idiopathic) with ulcer and inflammation of left lower extremity I89.0 Lymphedema, not elsewhere classified L97.221 Non-pressure chronic ulcer of left calf limited to breakdown of skin Facility Procedures CPT4 Code Description: [id number] 11042 - DEB SUBQ TISSUE 20 SQ CM/< Modifier: Quantity: 1 CPT4 Code Description: ICD-10 Diagnosis Description L97.221 Non-pressure chronic  ulcer of left calf limited to breakdown of skin I87.332 Chronic venous hypertension (idiopathic) with ulcer and inflammation of l I89.0 Lymphedema, not elsewhere classified Modifier: eft lower extremity Quantity: Physician Procedures CPT4 Code Description: [id number] 72094 - WC PHYS SUBQ TISS 20 SQ CM Modifier: Quantity: 1 CPT4 Code Description: ICD-10 Diagnosis Description L97.221 Non-pressure chronic ulcer of left calf limited to breakdown of skin I87.332 Chronic venous hypertension (idiopathic) with ulcer and inflammation of l I89.0 Lymphedema, not elsewhere classified Modifier: eft lower extremity Quantity: Electronic Signature(s) Signed: 05/10/2021 11:14:01 AM By: Kalman Shan DO Entered By: Heber [place],  Vida Nicol on 05/10/2021 11:13:38

## 2021-05-15 ENCOUNTER — Other Ambulatory Visit: Payer: Self-pay

## 2021-05-15 DIAGNOSIS — I87332 Chronic venous hypertension (idiopathic) with ulcer and inflammation of left lower extremity: Secondary | ICD-10-CM | POA: Diagnosis not present

## 2021-05-15 NOTE — Progress Notes (Addendum)
[name], [name] ([id number]) Visit Report for 05/15/2021 Arrival Information Details Patient Name: [name], [name]. Date of Service: 05/15/2021 11:30 AM Medical Record Number: [id number] Patient Account Number: [id number] Date of Birth/Sex: [birth year] (75 y.o. F) Treating [name]: [name] Primary Care [name]: [name] Other Clinician: Referring [name]: [name] Treating [name]/Extender: [name] in Treatment: 4 Visit Information History Since Last Visit Pain Present Now: No Patient Arrived: Ambulatory Arrival Time: 11:40 Accompanied By: self Transfer Assistance: None Patient Identification Verified: Yes Secondary Verification Process Completed: Yes Patient Requires Transmission-Based No Precautions: Patient Has Alerts: Yes Patient Alerts: Patient on Blood Thinner ASPIRIN NOT diabetic Electronic Signature(s) Signed: 05/15/2021 11:54:38 AM By: [name] Entered By: [name] on 05/15/2021 11:54:38 [name], [name] ([id number]) -------------------------------------------------------------------------------- Clinic Level of Care Assessment Details Patient Name: [name], [name]. Date of Service: 05/15/2021 11:30 AM Medical Record Number: [id number] Patient Account Number: [id number] Date of Birth/Sex: 05-09-1946 (75 y.o. F) Treating [name]: [name] Primary Care Davien [name]: [name] Other Clinician: Referring [name]: [name] Treating [name]/Extender: [name] in Treatment: 4 Clinic Level of Care Assessment Items TOOL 1 Quantity Score []  - Use when EandM and Procedure is performed on INITIAL visit 0 ASSESSMENTS - Nursing Assessment / Reassessment []  - General Physical Exam (combine w/ comprehensive assessment (listed just below) when performed on new 0 pt. evals) []  - 0 Comprehensive Assessment (HX, ROS, Risk Assessments, Wounds Hx, etc.) ASSESSMENTS - Wound and Skin Assessment / Reassessment []  -  Dermatologic / Skin Assessment (not related to wound area) 0 ASSESSMENTS - Ostomy and/or Continence Assessment and Care []  - Incontinence Assessment and Management 0 []  - 0 Ostomy Care Assessment and Management (repouching, etc.) PROCESS - Coordination of Care []  - Simple Patient / Family Education for ongoing care 0 []  - 0 Complex (extensive) Patient / Family Education for ongoing care []  - 0 Staff obtains Programmer, systems, Records, Test Results / Process Orders []  - 0 Staff telephones HHA, Nursing Homes / Clarify orders / etc []  - 0 Routine Transfer to another Facility (non-emergent condition) []  - 0 Routine Hospital Admission (non-emergent condition) []  - 0 New Admissions / Biomedical engineer / Ordering NPWT, Apligraf, etc. []  - 0 Emergency Hospital Admission (emergent condition) PROCESS - Special Needs []  - Pediatric / Minor Patient Management 0 []  - 0 Isolation Patient Management []  - 0 Hearing / Language / Visual special needs []  - 0 Assessment of Community assistance (transportation, D/C planning, etc.) []  - 0 Additional assistance / Altered mentation []  - 0 Support Surface(s) Assessment (bed, cushion, seat, etc.) INTERVENTIONS - Miscellaneous []  - External ear exam 0 []  - 0 Patient Transfer (multiple staff / Civil Service fast streamer / Similar devices) []  - 0 Simple Staple / Suture removal (25 or less) []  - 0 Complex Staple / Suture removal (26 or more) []  - 0 Hypo/Hyperglycemic Management (do not check if billed separately) []  - 0 Ankle / Brachial Index (ABI) - do not check if billed separately Has the patient been seen at the hospital within the last three years: Yes Total Score: 0 Level Of Care: ____ [name] ([id number]) Electronic Signature(s) Signed: 05/15/2021 4:08:35 PM By: [name] Entered By: [name] on 05/15/2021 11:56:14 [name]  ([id number]) -------------------------------------------------------------------------------- Compression Therapy Details Patient Name: [name], [name]. Date of Service: 05/15/2021 11:30 AM Medical Record Number: [id number] Patient Account Number: [id number] Date of Birth/Sex: 1946/09/26 (74 y.o. F) Treating [name]: [name] Primary Care Standley Bargo: [name],  [name] Other Clinician: Referring [name]: [name] Treating [name]/Extender: [name] Weeks in Treatment: 4 Compression Therapy Performed for Wound Assessment: Wound #13 Left,Distal,Posterior Lower Leg Performed By: Amber Daniels, [name] Compression Type: Three Layer Pre Treatment ABI: 0.9 Electronic Signature(s) Signed: 05/15/2021 11:55:13 AM By: [name] Entered By: [name] on 05/15/2021 11:55:13 [name], [name] ([id number]) -------------------------------------------------------------------------------- Encounter Discharge Information Details Patient Name: [name], [name]. Date of Service: 05/15/2021 11:30 AM Medical Record Number: [id number] Patient Account Number: [id number] Date of Birth/Sex: Jun 19, 1946 (75 y.o. F) Treating [name]: [name] Primary Care [name]: [name] Other Clinician: Referring [name]: [name] Treating [name]/Extender: [name] in Treatment: 4 Encounter Discharge Information Items Discharge Condition: Stable Ambulatory Status: Ambulatory Discharge Destination: Home Transportation: Private Auto Accompanied By: self Schedule Follow-up Appointment: Yes Clinical Summary of Care: Electronic Signature(s) Signed: 05/15/2021 11:56:03 AM By: [name] Entered By: [name] on 05/15/2021 11:56:03 [name], [name] ([id number]) -------------------------------------------------------------------------------- Wound Assessment Details Patient Name: [name], [name]. Date of Service: 05/15/2021 11:30 AM Medical Record Number:  [id number] Patient Account Number: [id number] Date of Birth/Sex: 1946-08-22 (75 y.o. F) Treating [name]: [name] Primary Care [name]: [name] Other Clinician: Referring [name]: [name] Treating [name]/Extender: [name] Weeks in Treatment: 4 Wound Status Wound Number: 13 Primary Etiology: Venous Leg Ulcer Wound Location: Left, Distal, Posterior Lower Leg Wound Status: Open Wounding Event: Gradually Appeared Date Acquired: 02/24/2021 Weeks Of Treatment: 4 Clustered Wound: No Wound Measurements Length: (cm) 3 Width: (cm) 2.5 Depth: (cm) 0.1 Area: (cm) 5.89 Volume: (cm) 0.589 % Reduction in Area: 0% % Reduction in Volume: 0% Wound Description Classification: Full Thickness Without Exposed Support Structure s Treatment Notes Wound #13 (Lower Leg) Wound Laterality: Left, Posterior, Distal Cleanser Soap and Water Discharge Instruction: Gently cleanse wound with antibacterial soap, rinse and pat dry prior to dressing wounds Peri-Wound Care Topical Primary Dressing Iodosorb 40 (g) Discharge Instruction: Apply IodoSorb to wound bed only as directed. Secondary Dressing ABD Pad 5x9 (in/in) Discharge Instruction: Cover with ABD pad Secured With Compression Wrap Profore Lite LF 3 Multilayer Compression Bandaging System Discharge Instruction: Apply 3 multi-layer wrap as prescribed. Compression Stockings Add-Ons Electronic Signature(s) Signed: 05/15/2021 4:08:35 PM By: [name] Entered By: [name] on 05/15/2021 11:54:50

## 2021-05-16 ENCOUNTER — Ambulatory Visit (INDEPENDENT_AMBULATORY_CARE_PROVIDER_SITE_OTHER): Payer: Medicare Other | Admitting: Pulmonary Disease

## 2021-05-16 ENCOUNTER — Encounter: Payer: Self-pay | Admitting: Pulmonary Disease

## 2021-05-16 DIAGNOSIS — J4520 Mild intermittent asthma, uncomplicated: Secondary | ICD-10-CM | POA: Diagnosis not present

## 2021-05-16 DIAGNOSIS — G4733 Obstructive sleep apnea (adult) (pediatric): Secondary | ICD-10-CM | POA: Diagnosis not present

## 2021-05-16 DIAGNOSIS — R400 Somnolence: Secondary | ICD-10-CM

## 2021-05-16 DIAGNOSIS — Z9989 Dependence on other enabling machines and devices: Secondary | ICD-10-CM

## 2021-05-16 NOTE — Patient Instructions (Addendum)
CPAP is working well at 12 cm Trial of melatonin around 10 pm  Alarm clock at 9 am Light exposure between 8-9 am

## 2021-05-16 NOTE — Assessment & Plan Note (Signed)
Refills on Singulair and albuterol MDI

## 2021-05-16 NOTE — Assessment & Plan Note (Signed)
Her hypersomnolence seems to be related to decrease sleep quantity rather than inadequate sleep treated OSA. She has developed insomnia and also has circadian rhythm disorder now with sleeping more in the daytime. Of asked her to take melatonin to encourage sleep to bedtime between midnight and 1 AM and wake up time around 8 AM with an alarm clock.  I then advised her light exposure in the mornings

## 2021-05-16 NOTE — Progress Notes (Signed)
   Subjective:    Patient ID: [name], female    DOB: May 15, 1946, 75 y.o.   MRN: [id number]  HPI  75 yo woman for followup of severe OSA & asthma   Moved from [place] 2010 - she was diagnosed with asthma in her 46s and took Advair for a long period of time.   PMH -  treated for melanoma from 2011-2013 requiring interferon therapy.  - Cervical disc disease and underwent fusion  She took neurotin for RLS for years   developed lymphedema after cervical fusion surgery. She has a Horner syndrome S/p venous stent for May Thurner syndrome   Breathing has been stable over the past year.  She reports increased sleepiness in the daytime.  She has developed insomnia and there are times where she does not sleep at night and goes to bed at 8 AM.  She is very sleepy during the day and is falling asleep talking to people. She wonders if this is due to sleep apnea She is also developing increased kyphosis and wonders if this will make her breathing worse  She has a dozen cats at home and a litter of kittens   Significant tests/ events reviewed  PSG 2003 which showed AHI of 81 events per hour and lowest desaturation to 57%. This was corrected by CPAP of 12 cm in with residual events of 5.6 events per hour on a study in 2007. CPAP 11/05/13 to 03/24/14 >> used on 140 of 140 nights with average 4 hrs and 36 min. Average AHI is 6.5 with median CPAP 12 cm H2O.   PFTs 04/2014- no airway obs, ratio ok, FEV1 80%, TLC & DLCO nml  Review of Systems neg for any significant sore throat, dysphagia, itching, sneezing, nasal congestion or excess/ purulent secretions, fever, chills, sweats, unintended wt loss, pleuritic or exertional cp, hempoptysis, orthopnea pnd or change in chronic leg swelling. Also denies presyncope, palpitations, heartburn, abdominal pain, nausea, vomiting, diarrhea or change in bowel or urinary habits, dysuria,hematuria, rash, arthralgias, visual complaints, headache, numbness weakness or  ataxia.     Objective:   Physical Exam  Gen. Pleasant, obese, in no distress ENT - no lesions, no post nasal drip Neck: No JVD, no thyromegaly, no carotid bruits Lungs: no use of accessory muscles, no dullness to percussion, decreased without rales or rhonchi  Cardiovascular: Rhythm regular, heart sounds  normal, no murmurs or gallops, 2+ peripheral edema Musculoskeletal: No deformities, no cyanosis or clubbing , no tremors       Assessment & Plan:

## 2021-05-16 NOTE — Assessment & Plan Note (Signed)
CPAP download was reviewed which shows excellent control of events on 12 cm.  Her compliance is about 4 hours every night and she has different sleep times every night. Weight loss encouraged, compliance with goal of at least 4-6 hrs every night is the expectation. Advised against medications with sedative side effects Cautioned against driving when sleepy - understanding that sleepiness will vary on a day to day basis  We discussed inspire device and she would have to lose weight to get to a BMI of 32 to qualify

## 2021-05-17 ENCOUNTER — Other Ambulatory Visit: Payer: Self-pay

## 2021-05-17 ENCOUNTER — Encounter (HOSPITAL_BASED_OUTPATIENT_CLINIC_OR_DEPARTMENT_OTHER): Payer: Medicare Other | Admitting: Internal Medicine

## 2021-05-17 DIAGNOSIS — I87332 Chronic venous hypertension (idiopathic) with ulcer and inflammation of left lower extremity: Secondary | ICD-10-CM | POA: Diagnosis not present

## 2021-05-18 DIAGNOSIS — Z85820 Personal history of malignant melanoma of skin: Secondary | ICD-10-CM | POA: Diagnosis not present

## 2021-05-18 DIAGNOSIS — L0889 Other specified local infections of the skin and subcutaneous tissue: Secondary | ICD-10-CM | POA: Diagnosis not present

## 2021-05-18 DIAGNOSIS — L0109 Other impetigo: Secondary | ICD-10-CM | POA: Diagnosis not present

## 2021-05-18 DIAGNOSIS — I8312 Varicose veins of left lower extremity with inflammation: Secondary | ICD-10-CM | POA: Diagnosis not present

## 2021-05-18 DIAGNOSIS — I8311 Varicose veins of right lower extremity with inflammation: Secondary | ICD-10-CM | POA: Diagnosis not present

## 2021-05-18 DIAGNOSIS — Z85828 Personal history of other malignant neoplasm of skin: Secondary | ICD-10-CM | POA: Diagnosis not present

## 2021-05-18 DIAGNOSIS — I872 Venous insufficiency (chronic) (peripheral): Secondary | ICD-10-CM | POA: Diagnosis not present

## 2021-05-18 NOTE — Progress Notes (Addendum)
[name], Amber ([id number]) Visit Report for 05/17/2021 Arrival Information Details Patient Name: [name], [name]. Date of Service: 05/17/2021 1:30 PM Medical Record Number: [id number] Patient Account Number: [id number] Date of Birth/Sex: 1945/11/13 (75 y.o. F) Treating RN: Carlene Coria Primary Care Syla Devoss: [name] Other Clinician: Referring Kyrie Fludd: [name] Treating Synethia Endicott/Extender: Yaakov Guthrie in Treatment: 5 Visit Information History Since Last Visit All ordered tests and consults were completed: No Patient Arrived: Ambulatory Added or deleted any medications: No Arrival Time: 13:40 Any new allergies or adverse reactions: No Accompanied By: self Had a fall or experienced change in No Transfer Assistance: None activities of daily living that may affect Patient Identification Verified: Yes risk of falls: Secondary Verification Process Completed: Yes Signs or symptoms of abuse/neglect since last visito No Patient Requires Transmission-Based No Hospitalized since last visit: No Precautions: Implantable device outside of the clinic excluding No Patient Has Alerts: Yes cellular tissue based products placed in the center Patient Alerts: Patient on Blood since last visit: Thinner Has Dressing in Place as Prescribed: Yes ASPIRIN Pain Present Now: No NOT diabetic Electronic Signature(s) Signed: 05/18/2021 4:28:30 PM By: Carlene Coria RN Entered By: Carlene Coria on 05/17/2021 13:45:26 Lolli, [name] ([id number]) -------------------------------------------------------------------------------- Clinic Level of Care Assessment Details Patient Name: Amber, [name]. Date of Service: 05/17/2021 1:30 PM Medical Record Number: [id number] Patient Account Number: [id number] Date of Birth/Sex: January 27, 1946 (75 y.o. F) Treating RN: Carlene Coria Primary Care Kaely Hollan: [name] Other Clinician: Referring Alexandrya Chim: [name] Treating  Jadene Stemmer/Extender: Yaakov Guthrie in Treatment: 5 Clinic Level of Care Assessment Items TOOL 1 Quantity Score []  - Use when EandM and Procedure is performed on INITIAL visit 0 ASSESSMENTS - Nursing Assessment / Reassessment []  - General Physical Exam (combine w/ comprehensive assessment (listed just below) when performed on new 0 pt. evals) []  - 0 Comprehensive Assessment (HX, ROS, Risk Assessments, Wounds Hx, etc.) ASSESSMENTS - Wound and Skin Assessment / Reassessment []  - Dermatologic / Skin Assessment (not related to wound area) 0 ASSESSMENTS - Ostomy and/or Continence Assessment and Care []  - Incontinence Assessment and Management 0 []  - 0 Ostomy Care Assessment and Management (repouching, etc.) PROCESS - Coordination of Care []  - Simple Patient / Family Education for ongoing care 0 []  - 0 Complex (extensive) Patient / Family Education for ongoing care []  - 0 Staff obtains Programmer, systems, Records, Test Results / Process Orders []  - 0 Staff telephones HHA, Nursing Homes / Clarify orders / etc []  - 0 Routine Transfer to another Facility (non-emergent condition) []  - 0 Routine Hospital Admission (non-emergent condition) []  - 0 New Admissions / Biomedical engineer / Ordering NPWT, Apligraf, etc. []  - 0 Emergency Hospital Admission (emergent condition) PROCESS - Special Needs []  - Pediatric / Minor Patient Management 0 []  - 0 Isolation Patient Management []  - 0 Hearing / Language / Visual special needs []  - 0 Assessment of Community assistance (transportation, D/C planning, etc.) []  - 0 Additional assistance / Altered mentation []  - 0 Support Surface(s) Assessment (bed, cushion, seat, etc.) INTERVENTIONS - Miscellaneous []  - External ear exam 0 []  - 0 Patient Transfer (multiple staff / Civil Service fast streamer / Similar devices) []  - 0 Simple Staple / Suture removal (25 or less) []  - 0 Complex Staple / Suture removal (26 or more) []  - 0 Hypo/Hyperglycemic Management (do  not check if billed separately) []  - 0 Ankle / Brachial Index (ABI) - do not check if billed separately Has the patient been seen at the hospital within  the last three years: Yes Total Score: 0 Level Of Care: ____ [name] ([id number]) Electronic Signature(s) Signed: 05/18/2021 4:28:30 PM By: Carlene Coria RN Entered By: Carlene Coria on 05/17/2021 14:30:27 Tony, [name] ([id number]) -------------------------------------------------------------------------------- Compression Therapy Details Patient Name: GEORGANNE, SIPLE. Date of Service: 05/17/2021 1:30 PM Medical Record Number: [id number] Patient Account Number: [id number] Date of Birth/Sex: 1946-07-08 (75 y.o. F) Treating RN: Carlene Coria Primary Care Hershy Flenner: [name] Other Clinician: Referring Ferguson Gertner: [name] Treating Bijan Ridgley/Extender: Yaakov Guthrie in Treatment: 5 Compression Therapy Performed for Wound Assessment: Wound #13 Left,Distal,Posterior Lower Leg Performed By: Clinician Carlene Coria, RN Compression Type: Three Layer Post Procedure Diagnosis Same as Pre-procedure Electronic Signature(s) Signed: 05/18/2021 4:28:30 PM By: Carlene Coria RN Entered By: Carlene Coria on 05/17/2021 14:29:49 LAURENASHLEY, VIAR ([id number]) -------------------------------------------------------------------------------- Encounter Discharge Information Details Patient Name: Amber, [name]. Date of Service: 05/17/2021 1:30 PM Medical Record Number: [id number] Patient Account Number: [id number] Date of Birth/Sex: July 19, 1946 (75 y.o. F) Treating RN: Carlene Coria Primary Care Zaidy Absher: [name] Other Clinician: Referring Tynisa Vohs: [name] Treating Ladasha Schnackenberg/Extender: Yaakov Guthrie in Treatment: 5 Encounter Discharge Information Items Discharge Condition: Stable Ambulatory Status: Ambulatory Discharge Destination: Home Transportation: Private Auto Accompanied By: self Schedule  Follow-up Appointment: Yes Clinical Summary of Care: Patient Declined Electronic Signature(s) Signed: 05/18/2021 4:28:30 PM By: Carlene Coria RN Entered By: Carlene Coria on 05/17/2021 14:32:07 [name] ([id number]) -------------------------------------------------------------------------------- Lower Extremity Assessment Details Patient Name: RAMAH, LANGHANS. Date of Service: 05/17/2021 1:30 PM Medical Record Number: [id number] Patient Account Number: [id number] Date of Birth/Sex: 05-18-1946 (75 y.o. F) Treating RN: Carlene Coria Primary Care Fahed Morten: [name] Other Clinician: Referring Reyes Aldaco: [name] Treating Coulter Oldaker/Extender: Yaakov Guthrie in Treatment: 5 Edema Assessment Assessed: [Left: No] [Right: No] [Left: Edema] [Right: :] Calf Left: Right: Point of Measurement: 32 cm From Medial Instep 42 cm Ankle Left: Right: Point of Measurement: 10 cm From Medial Instep 23 cm Vascular Assessment Pulses: Dorsalis Pedis Palpable: [Left:Yes] Electronic Signature(s) Signed: 05/18/2021 4:28:30 PM By: Carlene Coria RN Entered By: Carlene Coria on 05/17/2021 13:56:47 Pasquarelli, [name] ([id number]) -------------------------------------------------------------------------------- Multi Wound Chart Details Patient Name: CHAZLYN, CUDE. Date of Service: 05/17/2021 1:30 PM Medical Record Number: [id number] Patient Account Number: [id number] Date of Birth/Sex: September 04, 1946 (75 y.o. F) Treating RN: Carlene Coria Primary Care Vandy Fong: [name] Other Clinician: Referring Zetta Stoneman: [name] Treating Parveen Freehling/Extender: Yaakov Guthrie in Treatment: 5 Vital Signs Height(in): 63 Pulse(bpm): 36 Weight(lbs): 212 Blood Pressure(mmHg): 165/90 Body Mass Index(BMI): 38 Temperature(F): 98.3 Respiratory Rate(breaths/min): 18 Photos: [N/A:N/A] Wound Location: Left, Distal, Posterior Lower Leg N/A N/A Wounding Event: Gradually Appeared N/A  N/A Primary Etiology: Venous Leg Ulcer N/A N/A Comorbid History: Cataracts, Asthma, Sleep Apnea, N/A N/A Deep Vein Thrombosis, Hypertension, Peripheral Venous Disease, Osteoarthritis, Received Chemotherapy, Received Radiation Date Acquired: 02/24/2021 N/A N/A Weeks of Treatment: 5 N/A N/A Wound Status: Open N/A N/A Measurements L x W x D (cm) 2.4x2.2x0.1 N/A N/A Area (cm) : 4.147 N/A N/A Volume (cm) : 0.415 N/A N/A % Reduction in Area: 29.60% N/A N/A % Reduction in Volume: 29.50% N/A N/A Classification: Full Thickness Without Exposed N/A N/A Support Structures Exudate Amount: Medium N/A N/A Exudate Type: Serosanguineous N/A N/A Exudate Color: red, brown N/A N/A Granulation Amount: Medium (34-66%) N/A N/A Granulation Quality: Pink N/A N/A Necrotic Amount: Medium (34-66%) N/A N/A Exposed Structures: Fat Layer (Subcutaneous Tissue): N/A N/A Yes Fascia: No Tendon: No Muscle: No Joint: No Bone: No Epithelialization: None N/A N/A Debridement: Debridement -  Excisional N/A N/A Pre-procedure Verification/Time 14:32 N/A N/A Out Taken: Pain Control: Lidocaine 4% Topical Solution N/A N/A Tissue Debrided: Subcutaneous, Slough N/A N/A Level: Skin/Subcutaneous Tissue N/A N/A Debridement Area (sq cm): 5.28 N/A N/A Instrument: Curette N/A N/A Bleeding: Minimum N/A N/A Hemostasis Achieved: Pressure N/A N/A Procedural Pain: 0 N/A N/A IVANKA, KIRSHNER ([id number]) Post Procedural Pain: 0 N/A N/A Debridement Treatment Procedure was tolerated well N/A N/A Response: Post Debridement 2.4x2.2x0.1 N/A N/A Measurements L x W x D (cm) Post Debridement Volume: 0.415 N/A N/A (cm) Procedures Performed: Compression Therapy N/A N/A Debridement Treatment Notes Wound #13 (Lower Leg) Wound Laterality: Left, Posterior, Distal Cleanser Soap and Water Discharge Instruction: Gently cleanse wound with antibacterial soap, rinse and pat dry prior to dressing wounds Peri-Wound Care Topical Santyl  Collagenase Ointment, 30 (gm), tube Discharge Instruction: apply nickel thick to wound bed only Primary Dressing Hydrofera Blue Ready Transfer Foam, 2.5x2.5 (in/in) Discharge Instruction: Apply Hydrofera Blue Ready to wound bed as directed Secondary Dressing ABD Pad 5x9 (in/in) Discharge Instruction: Cover with ABD pad Secured With Compression Wrap Profore Lite LF 3 Multilayer Compression Bandaging System Discharge Instruction: Apply 3 multi-layer wrap as prescribed. Compression Stockings Add-Ons Electronic Signature(s) Signed: 05/17/2021 4:20:24 PM By: Kalman Shan DO Entered By: Kalman Shan on 05/17/2021 15:19:29 Royse, [name] ([id number]) -------------------------------------------------------------------------------- Multi-Disciplinary Care Plan Details Patient Name: NADIYAH, ZEIS. Date of Service: 05/17/2021 1:30 PM Medical Record Number: [id number] Patient Account Number: [id number] Date of Birth/Sex: 1946/06/22 (75 y.o. F) Treating RN: Carlene Coria Primary Care Hung Rhinesmith: [name] Other Clinician: Referring Amany Rando: [name] Treating Tyliyah Mcmeekin/Extender: Yaakov Guthrie in Treatment: 5 Active Inactive Wound/Skin Impairment Nursing Diagnoses: Impaired tissue integrity Goals: Patient/caregiver will verbalize understanding of skin care regimen Date Initiated: 04/12/2021 Target Resolution Date: 06/03/2021 Goal Status: Active Ulcer/skin breakdown will have a volume reduction of 30% by week 4 Date Initiated: 04/12/2021 Target Resolution Date: 05/12/2021 Goal Status: Active Interventions: Assess ulceration(s) every visit Treatment Activities: Referred to DME Adan [name] for dressing supplies : 04/12/2021 Skin care regimen initiated : 04/12/2021 Notes: Electronic Signature(s) Signed: 05/18/2021 4:28:30 PM By: Carlene Coria RN Entered By: Carlene Coria on 05/17/2021 14:25:00 Chesbro, [name]  ([id number]) -------------------------------------------------------------------------------- Pain Assessment Details Patient Name: SOMAYA, GRASSI. Date of Service: 05/17/2021 1:30 PM Medical Record Number: [id number] Patient Account Number: [id number] Date of Birth/Sex: 10-Nov-1945 (75 y.o. F) Treating RN: Carlene Coria Primary Care Wadie Mattie: [name] Other Clinician: Referring Jozi Malachi: [name] Treating Arrie Zuercher/Extender: Yaakov Guthrie in Treatment: 5 Active Problems Location of Pain Severity and Description of Pain Patient Has Paino No Site Locations Pain Management and Medication Current Pain Management: Electronic Signature(s) Signed: 05/18/2021 4:28:30 PM By: Carlene Coria RN Entered By: Carlene Coria on 05/17/2021 13:46:29 Yingst, [name] ([id number]) -------------------------------------------------------------------------------- Patient/Caregiver Education Details Patient Name: KYNNEDI, ZWEIG. Date of Service: 05/17/2021 1:30 PM Medical Record Number: [id number] Patient Account Number: [id number] Date of Birth/Gender: 1946-10-03 (75 y.o. F) Treating RN: Carlene Coria Primary Care Physician: [name] Other Clinician: Referring Physician: [name] Treating Physician/Extender: Yaakov Guthrie in Treatment: 5 Education Assessment Education Provided To: Patient Education Topics Provided Wound/Skin Impairment: Methods: Explain/Verbal Responses: State content correctly Electronic Signature(s) Signed: 05/18/2021 4:28:30 PM By: Carlene Coria RN Entered By: Carlene Coria on 05/17/2021 14:30:58 Dorey, [name] ([id number]) -------------------------------------------------------------------------------- Wound Assessment Details Patient Name: ANESSIA, [place]. Date of Service: 05/17/2021 1:30 PM Medical Record Number: [id number] Patient Account Number: [id number] Date of Birth/Sex: 05/26/1946 (75 y.o. F) Treating RN: Carlene Coria Primary  Care Demetrus Pavao:  [name] Other Clinician: Referring Chene Kasinger: [name] Treating Renny Gunnarson/Extender: Yaakov Guthrie in Treatment: 5 Wound Status Wound Number: 13 Primary Venous Leg Ulcer Etiology: Wound Location: Left, Distal, Posterior Lower Leg Wound Open Wounding Event: Gradually Appeared Status: Date Acquired: 02/24/2021 Comorbid Cataracts, Asthma, Sleep Apnea, Deep Vein Thrombosis, Weeks Of Treatment: 5 History: Hypertension, Peripheral Venous Disease, Osteoarthritis, Clustered Wound: No Received Chemotherapy, Received Radiation Photos Wound Measurements Length: (cm) 2.4 Width: (cm) 2.2 Depth: (cm) 0.1 Area: (cm) 4.147 Volume: (cm) 0.415 % Reduction in Area: 29.6% % Reduction in Volume: 29.5% Epithelialization: None Tunneling: No Undermining: No Wound Description Classification: Full Thickness Without Exposed Support Structures Exudate Amount: Medium Exudate Type: Serosanguineous Exudate Color: red, brown Foul Odor After Cleansing: No Slough/Fibrino Yes Wound Bed Granulation Amount: Medium (34-66%) Exposed Structure Granulation Quality: Pink Fascia Exposed: No Necrotic Amount: Medium (34-66%) Fat Layer (Subcutaneous Tissue) Exposed: Yes Necrotic Quality: Adherent Slough Tendon Exposed: No Muscle Exposed: No Joint Exposed: No Bone Exposed: No Treatment Notes Wound #13 (Lower Leg) Wound Laterality: Left, Posterior, Distal Cleanser Soap and Water Discharge Instruction: Gently cleanse wound with antibacterial soap, rinse and pat dry prior to dressing wounds Peri-Wound Care SAYDEE, ZOLMAN ([id number]) Topical Santyl Collagenase Ointment, 30 (gm), tube Discharge Instruction: apply nickel thick to wound bed only Primary Dressing Hydrofera Blue Ready Transfer Foam, 2.5x2.5 (in/in) Discharge Instruction: Apply Hydrofera Blue Ready to wound bed as directed Secondary Dressing ABD Pad 5x9 (in/in) Discharge Instruction: Cover with ABD  pad Secured With Compression Wrap Profore Lite LF 3 Multilayer Compression Bandaging System Discharge Instruction: Apply 3 multi-layer wrap as prescribed. Compression Stockings Add-Ons Electronic Signature(s) Signed: 05/18/2021 4:28:30 PM By: Carlene Coria RN Entered By: Carlene Coria on 05/17/2021 13:55:04 Berwanger, [name] ([id number]) -------------------------------------------------------------------------------- Vitals Details Patient Name: SHAKINA, CHOY. Date of Service: 05/17/2021 1:30 PM Medical Record Number: [id number] Patient Account Number: [id number] Date of Birth/Sex: 1946-08-04 (75 y.o. F) Treating RN: Carlene Coria Primary Care Elesha Thedford: [name] Other Clinician: Referring Sharlon Pfohl: [name] Treating Sherron Mummert/Extender: Yaakov Guthrie in Treatment: 5 Vital Signs Time Taken: 13:45 Temperature (F): 98.3 Height (in): 63 Pulse (bpm): 80 Weight (lbs): 212 Respiratory Rate (breaths/min): 18 Body Mass Index (BMI): 37.6 Blood Pressure (mmHg): 165/90 Reference Range: 80 - 120 mg / dl Electronic Signature(s) Signed: 05/18/2021 4:28:30 PM By: Carlene Coria RN Entered By: Carlene Coria on 05/17/2021 13:46:15

## 2021-05-18 NOTE — Progress Notes (Addendum)
KYLIEGH, JESTER ([id number]) Visit Report for 05/17/2021 Chief Complaint Document Details Patient Name: [name], [name]. Date of Service: 05/17/2021 1:30 PM Medical Record Number: [id number] Patient Account Number: [id number] Date of Birth/Sex: 12-Feb-1946 (75 y.o. F) Treating RN: Cornell Barman Primary Care Provider: [name] Other Clinician: Referring Provider: [name] Treating Provider/Extender: Yaakov Guthrie in Treatment: 5 Information Obtained from: Patient Chief Complaint Left calf venous stasis ulceration. 11/13/16; the patient is here for a left calf recurrent ulceration which is painful and has been present for the last month 01/22/17; the patient re-presents here for recurrent difficulties with blistering and leaking fluid on her left posterior calf 12/10/18; the patient returns to clinic for a wound on the left medial calf 04/12/2021; left lower extremity wound Electronic Signature(s) Signed: 05/17/2021 4:20:24 PM By: Kalman Shan DO Entered By: Kalman Shan on 05/17/2021 15:19:42 [name], [name] ([id number]) -------------------------------------------------------------------------------- Debridement Details Patient Name: [name], [name]. Date of Service: 05/17/2021 1:30 PM Medical Record Number: [id number] Patient Account Number: [id number] Date of Birth/Sex: 1945-12-27 (75 y.o. F) Treating RN: Carlene Coria Primary Care Provider: [name] Other Clinician: Referring Provider: [name] Treating Provider/Extender: Yaakov Guthrie in Treatment: 5 Debridement Performed for Wound #13 Left,Distal,Posterior Lower Leg Assessment: Performed By: Physician Kalman Shan, MD Debridement Type: Debridement Severity of Tissue Pre Debridement: Fat layer exposed Level of Consciousness (Pre- Awake and Alert procedure): Pre-procedure Verification/Time Out Yes - 14:32 Taken: Start Time: 14:32 Pain Control: Lidocaine 4% Topical  Solution Total Area Debrided (L x W): 2.4 (cm) x 2.2 (cm) = 5.28 (cm) Tissue and other material Viable, Non-Viable, Slough, Subcutaneous, Skin: Dermis , Skin: Epidermis, Slough debrided: Level: Skin/Subcutaneous Tissue Debridement Description: Excisional Instrument: Curette Bleeding: Minimum Hemostasis Achieved: Pressure End Time: 14:38 Procedural Pain: 0 Post Procedural Pain: 0 Response to Treatment: Procedure was tolerated well Level of Consciousness (Post- Awake and Alert procedure): Post Debridement Measurements of Total Wound Length: (cm) 2.4 Width: (cm) 2.2 Depth: (cm) 0.1 Volume: (cm) 0.415 Character of Wound/Ulcer Post Debridement: Improved Severity of Tissue Post Debridement: Fat layer exposed Post Procedure Diagnosis Same as Pre-procedure Electronic Signature(s) Signed: 05/17/2021 4:20:24 PM By: Kalman Shan DO Signed: 05/18/2021 4:28:30 PM By: Carlene Coria RN Entered By: Carlene Coria on 05/17/2021 14:33:46 [name], [name] ([id number]) -------------------------------------------------------------------------------- HPI Details Patient Name: [name], [name]. Date of Service: 05/17/2021 1:30 PM Medical Record Number: [id number] Patient Account Number: [id number] Date of Birth/Sex: 1945/12/16 (75 y.o. F) Treating RN: Cornell Barman Primary Care Provider: [name] Other Clinician: Referring Provider: [name] Treating Provider/Extender: Yaakov Guthrie in Treatment: 5 History of Present Illness HPI Description: Pleasant 75 year old with history of chronic venous insufficiency. No diabetes or peripheral vascular disease. Left ABI 1.29. Questionable history of left lower extremity DVT. She developed a recurrent ulceration on her left lateral calf in December 2015, which she attributes to poor diet and subsequent lower extremity edema. She underwent endovenous laser ablation of her left greater saphenous vein in 2010. She underwent laser ablation  of accessory branch of left GSV in April 2016 by Dr. Kellie Simmering at [facility]. She was previously wearing Unna boots, which she tolerated well. Tolerating 2 layer compression and cadexomer iodine. She returns to clinic for follow-up and is without new complaints. She denies any significant pain at this time. She reports persistent pain with pressure. No claudication or ischemic rest pain. No fever or chills. No drainage. READMISSION 11/13/16; this is a 75 year old woman who is not a diabetic. She is here for a review  of a painful area on her left medial lower extremity. I note that she was seen here previously last year for wound I believe to be in the same area. At that time she had undergone previously a left greater saphenous vein ablation by Dr. Kellie Simmering and she had a ablation of the anterior accessory branch of the left greater saphenous vein in March 2016. Seeing that the wound actually closed over. In reviewing the history with her today the ulcer in this area has been recurrent. She describes a biopsy of this area in 2009 that only showed stasis physiology. 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 Do not see that she has ever had arterial studies in the left leg. She tells me she has a follow-up with Dr. Kellie Simmering in roughly 10 days In any case she developed the reopening of this area roughly a month ago. On the background of this she describes rapidly increasing edema which has responded to Lasix 40 mg and metolazone 2.5 mg as well as the patient's lymph massage. She has been told she has both venous insufficiency and lymphedema but she cannot tolerate compression stockings 11/28/16; the patient saw Dr. Kellie Simmering recently. Per the patient he did arterial Dopplers in the office that did  not show evidence of arterial insufficiency, per the patient he stated "treat this like an ordinary venous ulcer". She also saw her dermatologist Dr. Ronnald Ramp who felt that this was more of a vascular ulcer. In general things are improving although she arrives today with increasing bilateral lower extremity edema with weeping a deeper fluid through the wound on the left medial leg compatible with some degree of lymphedema 12/04/16; the patient's wound is fully epithelialized but I don't think fully healed. We will do another week of depression with Promogran and TCA however I suspect we'll be able to discharge her next week. This is a very unusual-looking wound which was initially a figure-of-eight type wound lying on its side surrounded by petechial like hemorrhage. She has had venous ablation on this side. She apparently does not have an arterial issue per Dr. Kellie Simmering. She saw her dermatologist thought it was "vascular". Patient is definitely going to need ongoing compression and I talked about this with her today she will go to elastic therapy after she leaves here next week 12/11/16; the patient's wound is not completely closed today. She has surrounding scar tissue and in further discussion with the patient it would appear that she had ulcers in this area in 2009 for a prolonged period of time ultimately requiring a punch biopsy of this area that only showed venous insufficiency. I did not previously pickup on this part of the history from the patient. 12/18/16; the patient's wound is completely epithelialized. There is no open area here. She has significant bilateral venous insufficiency with secondary lymphedema to a mild-to-moderate degree she does not have compression stockings.. She did not say anything to me when I was in the room, she told our intake nurse that she was still having pain in this area. This isn't unusual recurrent small open area. She is going to go to elastic therapy to obtain  compression stockings. 12/25/16; the patient's wound is fully epithelialized. There is no open area here. The patient describes some continued episodic  discomfort in this area medial left calf. However everything looks fine and healed here. She is been to elastic therapy and caught herself 15-20 mmHg stockings, they apparently were having trouble getting 20-30 mm stockings in her size 01/22/17; this is a patient we discharged from the clinic a month ago. She has a recurrent open wound on her medial left calf. She had 15 mm support stockings. I told her I thought she needed 20-30 mm compression stockings. She tells me that she has been ill with hospitalization secondary to asthma and is been found to have severe hypokalemia likely secondary to a combination of Lasix and metolazone. This morning she noted blistering and leaking fluid on the posterior part of her left leg. She called our intake nurse urgently and we was saw her this afternoon. She has not had any real discomfort here. I don't know that she's been wearing any stockings on this leg for at least 2-3 days. ABIs in this clinic were 1.21 on the right and 1.3 on the left. She is previously seen vascular surgery who does not think that there is a peripheral arterial issue. 01/30/17; Patient arrives with no open wound on the left leg. She has been to elastic therapy and obtained 20-62mmhg below knee stockings and she has one on the right leg today. READMISSION 02/19/18; this Dyk is a now 75 year old patient we've had in this clinic perhaps 3 times before. I had last looked at her from January 07 December 2016 with an area on the medial left leg. We discharged her on 12/25/16 however she had to be readmitted on 01/22/17 with a recurrence. I have in my notes that we discharged her on 20-30 mm stockings although she tells me she was only wearing support hose because she cannot get stockings on predominantly related to her cervical spine surgery/issues.  She has had previous ablations done by vein and vascular in [place] including a great saphenous vein ablation on the left with an anterior accessory branch ablation I think both of these were in 2016. On one of the previous visit she had a biopsy noted 2009 that was negative. She is not felt to have an arterial issue. She is not a diabetic. She does have a history of obstructive sleep apnea hypertension asthma as well as chronic venous insufficiency and lymphedema. On this occasion she noted 2 dry scaly patch on her left leg. She tried to put lotion on this it didn't really help. There were 2 open areas.the patient has been seeing her primary physician from 02/05/18 through 02/14/18. She had Unna boots applied. The superior wound now on the lateral left leg has closed but she's had one wound that remains open on the lateral left leg. This is not the same spot as we dealt with in 2018. ABIs in this clinic were 1.3 bilaterally [name], [name] ([id number]) 02/26/18; patient has a small wound on the left lateral calf. Dimensions are down. She has chronic venous insufficiency and lymphedema. 03/05/18; small open area on the left lateral calf. Dimensions are down. Tightly adherent necrotic debris over the surface of the wound which was difficult to remove. Also the dressing [over collagen] stuck to the wound surface. This was removed with some difficulty as well. Change the primary dressing to Hydrofera Blue ready 03/12/18; small open area on the left lateral calf. Comes in with tightly adherent surface eschar as well as some adherent Hydrofera Blue. 03/19/18; open area on the left lateral calf. Again adherent surface eschar  as well as some adherent Hydrofera Blue nonviable subcutaneous tissue. She complained of pain all week even with the reduction from 4-3 layer compression I put on last week. Also she had an increase in her ankle and calf measurements probably related to the same thing. 03/26/18; open area on  the left lateral calf. A very small open area remains here. We used silver alginate starting last week as the Hydrofera Blue seem to stick to the wound bed. In using 4-layer compression 04/02/18; the open area in the left lateral calf at some adherent slough which I removed there is no open area here. We are able to transition her into her own compression stocking. Truthfully I think this is probably his support hose. However this does not maintain skin integrity will be limited. She cannot put over the toe compression stockings on because of neck problems hand problems etc. She is allergic to the lining layer of juxta lites. We might be forced to use extremitease stocking should this fail READMIT 11/24/2018 Patient is now a 75 year old woman who is not a diabetic. She has been in this clinic on at least 3 previous occasions largely with recurrent wounds on her left leg secondary to chronic venous insufficiency with secondary lymphedema. Her situation is complicated by inability to get stockings on and an allergy to neoprene which is apparently a component and at least juxta lites and other stockings. As a result she really has not been wearing any stockings on her legs. She tells [place] that roughly 2 or 3 weeks ago she started noticing a stinging sensation just above her ankle on the left medial aspect. She has been diagnosed with pseudogout and she wondered whether this was what she was experiencing. She tried to dress this with something she bought at the store however subsequently it pulled skin off and now she has an open wound that is not improving. She has been using Vaseline gauze with a cover bandage. She saw her primary doctor last week who put an Haematologist on her. ABIs in this clinic was 1.03 on the left 2/12; the area is on the left medial ankle. Odd-looking wound with what looks to be surface epithelialization but a multitude of small petechial openings. This clearly not closed yet. We have  been using silver alginate under 3 layer compression with TCA 2/19; the wound area did not look quite as good this week. Necrotic debris over the majority of the wound surface which required debridement. She continues to have a multitude of what looked to be small petechial openings. She reminds [place] that she had a biopsy on this initially during her first outbreak in 2015 in [place] dermatology. She expresses concern about this being a possible melanoma. 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 I assured her that this does not look anything like melanoma. Except for the petechial reaction it does look like a venous insufficiency area and she certainly has evidence of this on both sides 2/26; a difficult area on the left medial ankle. The patient clearly has chronic venous hypertension with some degree of lymphedema. The odd thing about the area is the small petechial hemorrhages. I am not really sure how to explain this. This was present last time and this is not a compression injury. We have been using Hydrofera Blue which I changed to last week 3/4; still using Hydrofera Blue. Aggressive debridement today. She does not  have known arterial issues. She has seen Dr. Kellie Simmering at [facility] vein and vascular and and has an ablation on the left. [Anterior accessory branch of the greater saphenous]. From what I remember they did not feel she had an arterial issue. The patient has had this area biopsied in 2009 at [facility] dermatology and by her recollection they said this was "stasis". She is also follow-up with dermatology locally who thought that this was more of a vascular issue 3/11; using Hydrofera Blue. Aggressive debridement today. She does not have an arterial issue. We are using 3 layer compression although we may need to go to 4. The patient has been in for multiple changes to her wrap since I last saw her  a week ago. She says that the area was leaking. I do not have too much more information on what was found 01/19/19 on evaluation today patient was actually being seen for a nurse visit when unfortunately she had the area on her left lateral lower extremity as well as weeping from the right lower extremity that became apparent. Therefore we did end up actually seeing her for a full visit with myself. She is having some pain at this site as well but fortunately nothing too significant at this point. No fevers, chills, nausea, or vomiting noted at this time. 3/18-Patient is back to the clinic with the left leg venous leg ulcer, the ulcer is larger in size, has a surface that is densely adherent with fibrinous tissue, the Hydrofera Blue was used but is densely adherent and there was difficulty in removing it. The right lower extremity was also wrapped for weeping edema. Patient has a new area over the left lateral foot above the malleolus that is small and appears to have no debris with intact surrounding skin. Patient is on increased dose of Lasix also as a means to edema management 3/25; the patient has a nonhealing venous ulcer on the medial left leg and last week developed a smaller area on the lateral left calf. We have been using Hydrofera Blue with a contact layer. 4/1; no major change in these wounds areas. Left medial and more recently left lateral calf. I tried Iodoflex last week to aid in debridement she did not tolerate this. She stated her pain was terrible all week. She took the top layer of the 4 layer compression off. 4/8; the patient actually looks somewhat better in terms of her more prominent left lateral calf wound. There is some healthy looking tissue here. She is still complaining of a lot of discomfort. 4/15; patient in a lot of pain secondary to sciatica. She is on a prednisone taper prescribed by her primary physician. She has the 2 areas one on the left medial and more recently  a smaller area on the left lateral calf. Both of these just above the malleoli 4/22; her back pain is better but she still states she is very uncomfortable and now feels she is intolerant to the [employer]. No real change in the wounds we have been using Sorbact. She has been previously intolerant to Iodoflex. There is not a lot of option about what we can use to debride this wound under compression that she no doubt needs. sHe states Ultram no longer works for her pain 4/29; no major change in the wounds slightly increased depth. Surface on the original medial wound perhaps somewhat improved however the more recent area on the lateral left ankle is 100% covered in very adherent debris we have  been using Sorbact. She tolerates 4 layer compression well and her edema control is a lot better. She has not had to come in for a nurse check 5/6; no major change in the condition of the wounds. She did consent to debridement today which was done with some difficulty. Continuing Sorbact. She did not tolerate Iodoflex. She was in for a check of her compression the day after we wrapped her last week this was adjusted but nothing much was found 5/13; no major change in the condition or area of the wounds. I was able to get a fairly aggressive debridement done on the lateral left leg wound. Even using Sorbact under compression. She came back in on Friday to have the wrap changed. She says she felt uncomfortable on the lateral aspect of her ankle. She has a long history of chronic venous insufficiency including previous ablation surgery on this side. 5/20-Patient returns for wounds on left leg with both wounds covered in slough, with the lateral leg wound larger in size, she has been in 3 layer compression and felt more comfortable, she describes pain in ankle, in leg and pins and needles in foot, and is about to try Pamelor for this 6/3; wounds on the left lateral and left medial leg. The area medially which is the  most recent of the 2 seems to have had the largest increase in [place], [name]. ([id number]) dimensions. We have been using Sorbac to try and debride the surface. She has been to see orthopedics they apparently did a plain x-ray that was indeterminant. Diagnosed her with neuropathy and they have ordered an MRI to determine if there is underlying osteomyelitis. This was not high on my thought list but I suppose it is prudent. We have advised her to make an appointment with vein and vascular in [place]. She has a history of a left greater saphenous and accessory vein ablations I wonder if there is anything else that can be done from a surgical point of view to help in these difficult refractory wounds. We have previously healed this wound on one occasion but it keeps on reopening [medial side] 6/10; deep tissue culture I did last week I think on the left medial wound showed both moderate E. coli and moderate staph aureus [MSSA]. She is going to require antibiotics and I have chosen Augmentin. We have been using Sorbact and we have made better looking wound surface on both sides but certainly no improvement in wound area. She was back in last Friday apparently for a dressing changes the wrap was hurting her outer left ankle. She has not managed to get a hold of vein and vascular in [place]. We are going to have to make her that appointment 6/17; patient is tolerating the Augmentin. She had an MRI that I think was ordered by orthopedic surgeon this did not show osteomyelitis or an abscess did suggest cellulitis. We have been using Sorbact to the lateral and medial ankles. We have been trying to arrange a follow-up appointment with vein and vascular in [place] or did her original ablations. We apparently an area sent the request to vein and vascular in [facility] 6/24; patient has completed the Augmentin. We do not yet have a vein and vascular appointment in [place]. I am not sure what the issue  is here we have asked her to call tomorrow. We are using Sorbact. Making some improvements and especially the medial wound. Both surfaces however look better medial and lateral. 7/1; the patient has  been in contact with vein and vascular in [place] but has not yet received an appointment. Using Sorbact we have gradually improve the wound surface with no improvement in surface area. She is approved for Apligraf but the wound surface still is not completely viable. She has not had to come in for a dressing change 7/8; the patient has an appointment with vein and vascular on 7/31 which is a Friday afternoon. She is concerned about getting back here for [place] to dress her wounds. I think it is important to have them goal for her venous reflux/history of ablations etc. to see if anything else can be done. She apparently tested positive for 1 of the blood tests with regards to lupus and saw a rheumatologist. He has raised the issue of vasculitis again. I have had this thought in the past however the evidence seems overwhelming that this is a venous reflux etiology. If the rheumatologist tells me there is clinical and laboratory investigation is positive for lupus I will rethink this. 7/15; the patient's wound surfaces are quite a bit better. The medial area which was her original wound now has no depth although the lateral wound which was the more recent area actually appears larger. Both with viable surfaces which is indeed better. Using Sorbact. I wanted to use Apligraf on her however there is the issue of the vein and vascular appointment on 7/31 at 2:00 in the afternoon which would not allow her to get back to be rewrapped and they would no doubt remove the graft 7/22; the patient's wound surfaces have moderate amount of debris although generally look better. The lateral one is larger with 2 small satellite areas superiorly. We are waiting for her vein and vascular appointment on 7/31. She has been  approved for Apligraf which I would like to use after th 7/29; wound surfaces have improved no debridement is required we have been using Sorbact. She sees vein and vascular on Friday with this so question of whether anything can be done to lessen the likelihood of recurrence and/or speed the healing of these areas. She is already had previous ablations. She no doubt has severe venous hypertension 8/5-Patient returns at 1 week, she was in [place] for 3 days by her podiatrist, we have been using so backed to the wound, she has increased pain in both the wounds on the left lower leg especially the more distal one on the lateral aspect 8/12-Patient returns at 1 week and she is agreeable to having debridement in both wounds on her left leg today. We have been using Sorbact, and vascular studies were reviewed at last visit 8/19; the patient arrives with her wounds fairly clean and no debridement is required. We have used Sorbact which is really done a nice job in cleaning up these very difficult wound surfaces. The patient saw Dr. Donzetta Matters of vascular surgery on 7/31. He did not feel that there was an arterial component. He felt that her treated greater saphenous vein is adequately addressed and that the small saphenous vein did not appear to be involved significantly. She was also noted to have deep venous reflux which is not treatable. Dr. Donzetta Matters mentioned the possibility of a central obstructive component leading to reflux and he offered her central venography. She wanted to discuss this or think about it. I have urged her to go ahead with this. She has had recurrent difficult wounds in these areas which do heal but after months in the clinic. If there is  anything that can be done to reduce the likelihood of this I think it is worth it. 9/2 she is still working towards getting follow-up with Dr. Donzetta Matters to schedule her CT. Things are quite a bit worse venography. I put Apligraf on 2 weeks ago on both wounds  on the medial and lateral part of her left lower leg. She arrives in clinic today with 3 superficial additional wounds above the area laterally and one below the wound medially. She describes a lot of discomfort. I think these are probably wrapped injuries. Does not look like she has cellulitis. 07/20/2019 on evaluation today patient appears to be doing somewhat poorly in regard to her lower extremity ulcers. She in fact showed signs of erythema in fact we may even be dealing with an infection at this time. Unfortunately I am unsure if this is just infection or if indeed there may be some allergic reaction that occurred as a result of the Apligraf application. With that being said that would be unusual but nonetheless not impossible in this patient is one who is unfortunately allergic to quite a bit. Currently we have been using the Sorbact which seems to do as well as anything for her. I do think we may want to obtain a culture today to see if there is anything showing up there that may need to be addressed. 9/16; noted that last week the wounds look worse in 1 week follow-up of the Apligraf. Using Sorbact as of 2 days ago. She arrives with copious amounts of drainage and new skin breakdown on the back of the left calf. The wounds arm more substantial bilaterally. There is a fair amount of swelling in the left calf no overt DVT there is edema present I think in the left greater than right thigh. She is supposed to go on 9/28 for CT venography. The wounds on the medial and lateral calf are worse and she has new skin breakdown posteriorly at least new for me. This is almost developing into a circumferential wound area The Apligraf was taken off last week which I agree with things are not going in the right direction a culture was done we do not have that back yet. She is on Augmentin that she started 2 days ago 9/23; dressing was changed by her nurses on Monday. In general there is no improvement in the  wound areas although the area looks less angry than last week. She did get Augmentin for MSSA cultured on the 14th. She still appears to have too much swelling in the left leg even with 3 layer compression 9/30; the patient underwent her procedure on 9/28 by Dr. Donzetta Matters at [facility]. She was discovered to have the common iliac vein measuring 12.2 mm but at the level of L4-L5 measured 3 mm. After stenting it measured 10 mm. 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 Rouleaux flow in the common femoral and femoral vein was observed much improved after stenting. We are using silver alginate to the wounds on the medial and lateral ankle on the left. 4 layer compression 10/7; the patient had fluid swelling around her knee and 4 layer compression. At the advice of vein and vascular this was reduced to 3 layer which she is tolerating better. We have been using silver alginate under 3 layer compression since last Friday 10/14; arrives with the areas on the left ankle looking a lot better. Inflammation in the area also a lot better. She came  in for a nurse check on 10/9 10/21; continued nice improvement. Slight improvements in surface area of both the medial and lateral wounds on the left. A lot of the satellite lesions in the weeping erythema around these from stasis dermatitis is resolved. We have been using silver alginate [name], [name] ([id number]) 10/28; general improvement in the entire wound areas although not a lot of change in dimensions the wound certainly looks better. There is a lot less in terms of venous inflammation. Continue silver alginate this week however look towards Hydrofera Blue next week 11/4; very adherent debris on the medial wound left wound is not as bad. We have been using silver alginate. Change to [facility] today 11/11; very adherent debris on both wound areas. She went to vein and vascular last week and follow-up they put in [place] boot on  this today. He says the [place] was adherent. Wound is definitely not as good as last week. Especially on the left there the satellite lesions look more prominent 11/18; absolutely no better. erythema on lateral aspect with tenderness. 09/30/2019 on evaluation today patient appears to actually be doing better. Dr. Dellia Nims did put her on doxycycline last week which I do believe has helped her at this point. Fortunately there is no signs of active infection at this time. No fevers, chills, nausea, vomiting, or diarrhea. I do believe he may want extend the doxycycline for 7 additional days just to ensure everything does completely cleared up the patient is in agreement with that plan. Otherwise she is going require some sharp debridement today 12/2; patient is completing a 2-week course of doxycycline. I gave her this empirically for inflammation as well as infection when I last saw her 2 weeks ago. All of this seems to be better. She is using silver alginate she has the area on the medial aspect of the larger area laterally and the 2 small satellite regions laterally above the major wound. 12/9; the patient's wound on the left medial and left lateral calf look really quite good. We have been using silver alginate. She saw vein and vascular in follow-up on 10/09/2019. She has had a previous left greater saphenous vein ablation by Dr. Oscar La in 2016. More recently she underwent a left common iliac vein stent by Dr. 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 The swelling is improved and certainly the wounds have improved. The patient shows [place] today area on the right medial calf there is almost no wound but leaking lymphedema. She says she start this started 3 or 4 days ago. She did not traumatize it. It is not painful. She does not wear compression on that side 12/16; the patient continues to do well laterally. Medially still requiring debridement. The area on the right calf did not  materialize to anything and is not currently open. We wrapped this last time. She has support stockings for that leg although I am not sure they are going to provide adequate compression 12/23; the lateral wound looks stable. Medially still requiring debridement for tightly adherent fibrinous debris. We've been using silver alginate. Surface area not any different 12/30; neither wound is any better with regards to surface and the area on the left lateral is larger. I been using silver alginate to the left lateral which look quite good last week and Sorbact to the left medial 11/11/2019. Lateral wound area actually looks better and somewhat smaller. Medial still requires a very aggressive debridement today. We have been using Sorbact  on both wound areas 1/13; not much better still adherent debris bilaterally. I been using Sorbact. She has severe venous hypertension. Probably some degree of dermal fibrosis distally. I wonder whether tighter compression might help and I am going to try that today. We also need to work on the bioburden 1/20; using Sorbact. She has severe venous hypertension status post stent placement for pelvic vein compression. We applied gentamicin last time to see if we could reduce bioburden I had some discussion with her today about the use of pentoxifylline. This is occasionally used in this setting for wounds with refractory venous insufficiency. However this interacts with Plavix. She tells me that she was put on this after stent placement for 3 months. She will call Dr. Claretha Cooper office to discuss 1/27; we are using gentamicin under Sorbact. 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 She has a stent. Wound medially is measuring smaller this week. Laterally measuring slightly larger although she has some satellite lesions superiorly 2/3; gentamicin under Sorbact under 4-layer compression. 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 She has a stent on Plavix. Her wounds are measuring smaller this week. More substantially laterally where there is a satellite lesion superiorly. 2/10; gentamicin under Sorbac. 4-layer compression. Patient communicated with Dr. Donzetta Matters at vein and vascular in [place]. He is okay with the patient coming off Plavix I will therefore start her on pentoxifylline for a 1 month trial. In general her wounds look better today. I had some concerns about swelling in the left thigh however she measures 61.5 on the right and 63 on the mid thigh which does not suggest there is any difficulty. The patient is not describing any pain. 2/17; gentamicin under Sorbac 4-layer compression. She has been on pentoxifylline for 1 week and complains of loose stool. No nausea she is eating and drinking well 2/24; the patient apparently came in 2 days ago for a nurse visit when her wrap fell down. Both areas look a little worse this week macerated medially and satellite lesions laterally. Change to silver alginate today 3/3; wounds are larger today especially medially. She also has more swelling in her foot lower leg and I even noted some swelling in her posterior thigh which is tender. I wonder about the patency of her stent. Fortuitously she sees Dr. Claretha Cooper group on Friday 3/10; Mrs. Dade was seen by vein and vascular on 3/5. The patient underwent ultrasound. There was no evidence of thrombosis involving the IVC no evidence of thrombosis involving the right common iliac vein there is no evidence of thrombosis involving the right external iliac vein the left external vein is also patent. The right common iliac vein stent appears patent bilateral common femoral veins are compressible and appear patent. I was concerned about the left common iliac stent however it looks like this is functional. She has some edema in the posterior thigh that was tender she still has that this week. I also note they had trouble  finding the pulses in her left foot and booked her for an ABI baseline in 4 weeks. She will follow up in 6 months for repeat IVC duplex. The patient stopped the pentoxifylline because of diarrhea. It does not look like that was being effective in any case. I have advised her to go back on her aspirin 81 mg tablet, vascular it also suggested this 3/17; comes in today with her wound surfaces a lot better. The excoriations from last week considerably better probably secondary to  the TCA. We have been using silver alginate 3/24; comes in today with smaller wounds both medially and laterally. Both required debridement. There are 2 small satellite areas superiorly laterally. She also has a very odd bandlike area in the mid calf almost looking like there was a weakness in the wrap in a localized area. I would write this off as being this however anteriorly she has a small raised ballotable area that is very tender almost reminiscent of an abscess but there was no obvious purulent surface to it. 02/04/20 upon evaluation today patient appears to be doing fairly well in regard to her wounds today. Fortunately there is no signs of active infection at this time. No fevers, chills, nausea, vomiting, or diarrhea. She has been tolerating the dressing changes without complication. Fortunately I feel like she is showing signs of improvement although has been sometime since have seen her. Nonetheless the area of concern that Dr. Dellia Nims had last week where she had possibly an area of the wrap that was we can allow the leg to bulge appears to be doing significantly better today there is no signs of anything worsening. AMARIYA, LISKEY ([id number]) 4/7; the patient's wounds on her medial and lateral left leg continue to contract. We have been using a regular alginate. Last week she developed an area on the right medial lower leg which is probably a venous ulcer as well. 4/14; the wounds on her left medial and lateral  lower leg continue to contract. Surface eschar. We have been using regular alginate. The area on the right medial lower leg is closed. We have been putting both legs under 4-layer contraction. The patient went back to see vein and vascular she had arterial studies done which were apparently "quite good" per the patient although I have not read their notes I have never felt she had an arterial issue. 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 This is been longstanding and refractory to exercise, leg elevation and longstanding use of compression wraps in our clinic as well as compression stockings on the times we have been able to get these to heal 4/21; we thought she actually might be close this week however she arrives in clinic with a lot of edema in her upper left calf and into her posterior thigh. This is been an intermittent problem here. She says the wrap fell down but it was replaced with a nurse visit on Monday. We are using calcium alginate to the wounds and the wound sizes there not terribly larger than last week but there is a lot more edema 4/28; again wound edges are smaller on both sides. Her edema is better controlled than last time. She is obtained her compression pumps from [employer] although they have not been to her home to set these up. 5/5; left medial and left lateral both look stable. I am not sure the medial is any smaller. We have been using calcium alginate under 4-layer compression. oShe had an area on the right medial. This was eschared today. We have been wrapping this as well. She does not tolerate external compression stockings due to a history of various contact allergies. She has her compression pumps however the representative from the company is coming on her to show her how to use these tomorrow 5/19; patient with severe chronic venous insufficiency secondary to central venous disease. She had a stent placed in  her left common iliac vein. She has done better since but still  difficult to control wounds. She comes in today with nothing open on the right leg. Her areas on the left medial and left lateral are just about closed. We are using calcium alginate under 4-layer compression. She is using her external compression pumps at home She only has 15-20 support stockings. States she cannot get anything tighter than that on. 03/30/20-Patient returns at 1 week, the wounds on the left leg are both slightly bigger, the last week she was on 3 layer compression which started to slide down. She is starting to use her lymphedema pumps although she stated on 1 day her right ankle started to swell up and she have to stop that day. Unfortunately the open area seem to oscillate between improving to the point of healing and then flaring up all to do with effectiveness of compression or lack of due to the left leg topography not keeping the compression wraps from rolling down 6/2 patient comes in with a 15/20 mmHg stocking on the right leg. She tells me that she developed a lot of swelling in her ankles she saw orthopedics she was felt to possibly be having a flare of pseudogout versus some other type of arthritis. She was put on steroids for a respiratory issue so that helps with the inflammation. She has not been using the pumps all week. She thinks the left thigh is more swollen than usual and I would agree with that. She has an appointment with Dr. Donzetta Matters 9 days or so from now 6/9; both wounds on the left medial and left lateral are smaller. We have been using calcium alginate under compression. She does not have an open wound on the right leg she is using a stocking and her compression pumps things are going well. She has an appointment with Dr. Donzetta Matters with regards to her stent in the left common iliac vein 6/16; the wounds on the left medial and left lateral ankle continues to contract. The patient saw Dr. Donzetta Matters and I think  he seems satisfied. Ordered follow-up venous reflux studies on both sides in September. Cautioned that she may need thigh-high stockings. She has been using calcium alginate under compression on the left and her own stocking on the right leg. She tells [place] there are no open wounds on the right 6/23; left lateral is just about closed. Medial required debridement today. We have been using calcium alginate. Extensive discussion about the compression pumps she is only using these on 25 mmHg states she could not take 40 or 30 when the wrap came out to her home to demonstrate these. He said they should not feel tight 6/30; the left lateral wound has a slight amount of eschar. . The area medially is about the same using Hydrofera Blue. 7/7; left lateral wound still has some eschar. I will remove this next week may be closed. The area medially is very small using Hydrofera Blue with improvement. Unfortunately the stockings fell down. Unfortunately the blisters have developed at the edge of where the wrap fell. When this happened she says her legs hurt she did not use her pumps. We are not open Monday for her to come in and change the wraps and she had an appointment yesterday. She also tells me that she is going to have an MRI of her back. She is having pain radiating into her left anterior leg she thinks her from an L5 disc. She saw Dr. Ellene Route of neurosurgery 7/14; the area on the left lateral ankle area is  closed. Still a small area medially however it looks better as well. We have been using Hydrofera Blue under 4-layer compression 7/21; left lateral ankle is still closed however her wound on the medial left calf is actually larger. This is probably because Hydrofera Blue got stuck to the wound. She came in for a nurse change on Friday and will do that again this week I was concerned about the amount of swelling that she had last week however she is using her compression pumps twice a day and the swelling  seems well controlled 7/28; remaining wound on the left medial lower leg is smaller. We have been using moistened silver collagen under compression she is coming back for a nurse visit. For reasons that were not really clear she was just keeping her legs elevated and not using her compression pumps. I have asked her to use the compression pumps. She does not have any wounds on the right leg 06/15/20-Patient returns at 2 weeks, her LLE edema is worse and she developed a blister wound that is new and has bigger posterior calf wound on right, we are using Prisma with pad, 4 layer compression. she has been on lasix 40 mg daily 8/18; patient arrives today with things a lot worse than I remember from a few weeks ago. She was seen last week. Noted that her edema was worse and that she now had a left lateral wound as well as deteriorating edema in the medial and posterior part of the lower leg. She says she is using his or her external compression pumps once a day although I wonder about the compliance. 8/25; weeping area on the right medial lower leg. This had actually gotten a small localized area of her compression stocking wet. oOn the left side there is a large denuded area on the posterior medial lower leg and smaller area on the lateral. This was not the original areas that we dealt with. 9/1 the patient's wound on the left leg include the left lateral and left posterior. Larger superficial wounds weeping. She has very poor edema control. Tender localized edema in the left lower medial ankle/heel probably because of localized wrap issues. She freely admits she is not using the compression pumps. She has been up on her feet a lot. She thinks the hydrofera blue is contributing to the pain she is experiencing.. This is a complaint that I have occasionally heard 9/8; really not much improvement. The patient is still complaining of a lot of pain particularly when she uses compression pumps. I switched  her to silver alginate last time because she found the Hydrofera Blue to be irritating. I don't hear much difference in her description with the silver alginate. 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 She has follow-up with Dr. Donzetta Matters I think for ultrasound next month PRESLYNN, BIER ([id number]) 9/15; quite a bit of improvement today. We have less edema and more epithelialization in both of her wound areas on the left medial and left lateral calf. These are not the site of her original wounds in this area. She says she has been using her compression pumps for 30 minutes twice a day, there pain issues that never quite understood. Silver alginate as the primary dressing 9/22; continued improvement. Both areas medially and laterally still have a small open area there is some eschar. She continues to complain of left medial ankle pain. Swelling  in the leg is in much better condition. We have been using silver alginate 9/29; continued improvement. Both areas medially and laterally in the left calf look as though they are close some minor surface eschar but I think this is epithelialized. She comes in today saying she has a ruptured disc at L4-L5 cannot bend over to put on her stockings. 10/6; patient comes in today with no open wounds on either leg. However her edema on the left leg in the upper one third of the lower leg is poorly controlled nonpitting. She says that she could not use the pumps for 2 days and then she has been using the last couple of days. It is not clear to me she has been able to get her stocking on. She has back problems. Mrs. Harbeck has severe chronic venous insufficiency with secondary lymphedema. Her venous insufficiency is partially centrally mediated and that she is now post stent in the left common iliac vein. [facility] Thurner's syndrome/physiology]. She follows up with them on 10/15. She wears  20/30 below-knee stockings. She is supposed to use compression pumps at home although I think her compliance about with this is been less than 100%. I have asked her to use these 3 times a day. Finally I think she has lipodermatosclerosis in the left lower leg with an inverted bottle sign. It is been a major problem controlling the edema in the left leg. The right leg we have had wounds on but not as significant a problem is on the left READMISSION 04/12/2021 Mrs. Manz is a 75 year old woman we know well in this clinic. She has severe chronic venous insufficiency. 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 I believe she has had bilateral greater saphenous vein ablation in the past as well. She tells me that this wound opened sometime in March. She had a fall and thinks it was initially abrasion. She developed areas she describes as little blisters on the anterior part of her leg and she saw dermatology and was treated for methicillin staph aureus with several rounds of antibiotics. She has been using support stockings on the left leg and says this is the only thing she can get on. Her compression pump use maybe once a day she says if she did not use one she use the other. She comes in today with incredible swelling in the left leg with a wound on the left posterior calf. She has been using Neosporin to this previously a hydrocolloid. 6/15; patient arrives back for 1 week follow-up.Marland Kitchen Apparently her wrap fell down she did not call us to replace this. He has poor edema control. She only uses her compression pumps once a day 6/29; patient presents for 1 week follow-up. She has tolerated the compression wrap well. We have been using Iodoflex under the wrap. She has no issues or complaints today. 7/6; patient presents for 1 week follow-up. She states that the compression wrap rolled down her leg 4 days ago. She has been trying to keep the area covered but has no  dressings at home to use. She denies signs of infection. 7/13; patient presents for 1 week follow-up. She states that her compression wrap rolled down her right leg and she called our office and had it placed a few days ago. She has been tolerating the current wrap well. She states that the Iodoflex is causing a burning sensation. She denies signs of infection. Electronic Signature(s) Signed: 05/17/2021 4:20:24  PM By: Kalman Shan DO Entered By: Kalman Shan on 05/17/2021 15:21:05 [name] ([id number]) -------------------------------------------------------------------------------- Physical Exam Details Patient Name: LINEA, CALLES. Date of Service: 05/17/2021 1:30 PM Medical Record Number: [id number] Patient Account Number: [id number] Date of Birth/Sex: 08/11/1946 (75 y.o. F) Treating RN: Cornell Barman Primary Care Provider: [name] Other Clinician: Referring Provider: [name] Treating Provider/Extender: Yaakov Guthrie in Treatment: 5 Constitutional . Psychiatric . Notes Left lower extremity: To the posterior aspect there is an open wound with Granulation tissue and nonviable tissue present. No signs of infection Electronic Signature(s) Signed: 05/17/2021 4:20:24 PM By: Kalman Shan DO Entered By: Kalman Shan on 05/17/2021 16:03:32 [name], [name] ([id number]) -------------------------------------------------------------------------------- Physician Orders Details Patient Name: TYLIA, EWELL. Date of Service: 05/17/2021 1:30 PM Medical Record Number: [id number] Patient Account Number: [id number] Date of Birth/Sex: 10/23/1946 (75 y.o. F) Treating RN: Carlene Coria Primary Care Provider: [name] Other Clinician: Referring Provider: [name] Treating Provider/Extender: Yaakov Guthrie in Treatment: 5 Verbal / Phone Orders: No Diagnosis Coding Follow-up Appointments Wound #13 Left,Distal,Posterior Lower  Leg o Return Appointment in 1 week. o Nurse Visit as needed Bathing/ Shower/ Hygiene o May shower with wound dressing protected with water repellent cover or cast protector. Anesthetic (Use 'Patient Medications' Section for Anesthetic Order Entry) o Lidocaine applied to wound bed Edema Control - Lymphedema / Segmental Compressive Device / Other o Elevate, Exercise Daily and Avoid Standing for Long Periods of Time. o Elevate legs to the level of the heart and pump ankles as often as possible o Elevate leg(s) parallel to the floor when sitting. o Compression Pump: Use compression pump on left lower extremity for 60 minutes, twice daily. - TWICE DAILY for 1 hour o Compression Pump: Use compression pump on right lower extremity for 60 minutes, twice daily. - TWICE DAILY for 1 hour Wound Treatment Wound #13 - Lower Leg Wound Laterality: Left, Posterior, Distal Cleanser: Soap and Water 1 x Per Week/30 Days Discharge Instructions: Gently cleanse wound with antibacterial soap, rinse and pat dry prior to dressing wounds Topical: Santyl Collagenase Ointment, 30 (gm), tube (Home Health) 1 x Per Week/30 Days Discharge Instructions: apply nickel thick to wound bed only Primary Dressing: Hydrofera Blue Ready Transfer Foam, 2.5x2.5 (in/in) (DME) (Generic) 1 x Per Week/30 Days Discharge Instructions: Apply Hydrofera Blue Ready to wound bed as directed Secondary Dressing: ABD Pad 5x9 (in/in) 1 x Per Week/30 Days Discharge Instructions: Cover with ABD pad Compression Wrap: Profore Lite LF 3 Multilayer Compression Bandaging System 1 x Per Week/30 Days Discharge Instructions: Apply 3 multi-layer wrap as prescribed. Electronic Signature(s) Signed: 05/17/2021 4:20:24 PM By: Kalman Shan DO Signed: 05/18/2021 4:28:30 PM By: Carlene Coria RN Entered By: Carlene Coria on 05/17/2021 14:29:26 [name], [name]  ([id number]) -------------------------------------------------------------------------------- Problem List Details Patient Name: SHEFALI, NG. Date of Service: 05/17/2021 1:30 PM Medical Record Number: [id number] Patient Account Number: [id number] Date of Birth/Sex: 08-12-1946 (75 y.o. F) Treating RN: Cornell Barman Primary Care Provider: [name] Other Clinician: Referring Provider: [name] Treating Provider/Extender: Yaakov Guthrie in Treatment: 5 Active Problems ICD-10 Encounter Code Description Active Date MDM Diagnosis I87.332 Chronic venous hypertension (idiopathic) with ulcer and inflammation of 04/12/2021 No Yes left lower extremity I89.0 Lymphedema, not elsewhere classified 04/12/2021 No Yes L97.221 Non-pressure chronic ulcer of left calf limited to breakdown of skin 04/12/2021 No Yes Inactive Problems Resolved Problems Electronic Signature(s) Signed: 05/17/2021 4:20:24 PM By: Kalman Shan DO Entered By: Kalman Shan on 05/17/2021 15:19:22 Thedford, Chanita  JMarland Kitchen ([id number]) -------------------------------------------------------------------------------- Progress Note Details Patient Name: [name], [name]. Date of Service: 05/17/2021 1:30 PM Medical Record Number: [id number] Patient Account Number: [id number] Date of Birth/Sex: [birth year] (75 y.o. F) Treating RN: Cornell Barman Primary Care Provider: [name] Other Clinician: Referring Provider: [name] Treating Provider/Extender: Yaakov Guthrie in Treatment: 5 Subjective Chief Complaint Information obtained from Patient Left calf venous stasis ulceration. 11/13/16; the patient is here for a left calf recurrent ulceration which is painful and has been present for the last month 01/22/17; the patient re-presents here for recurrent difficulties with blistering and leaking fluid on her left posterior calf 12/10/18; the patient returns to clinic for a wound on the left medial calf 04/12/2021;  left lower extremity wound History of Present Illness (HPI) Pleasant 75 year old with history of chronic venous insufficiency. No diabetes or peripheral vascular disease. Left ABI 1.29. Questionable history of left lower extremity DVT. She developed a recurrent ulceration on her left lateral calf in December 2015, which she attributes to poor diet and subsequent lower extremity edema. She underwent endovenous laser ablation of her left greater saphenous vein in 2010. She underwent laser ablation of accessory branch of left GSV in April 2016 by Dr. Kellie Simmering at [facility]. She was previously wearing Unna boots, which she tolerated well. Tolerating 2 layer compression and cadexomer iodine. She returns to clinic for follow-up and is without new complaints. She denies any significant pain at this time. She reports persistent pain with pressure. No claudication or ischemic rest pain. No fever or chills. No drainage. READMISSION 11/13/16; this is a 75 year old woman who is not a diabetic. She is here for a review of a painful area on her left medial lower extremity. I note that she was seen here previously last year for wound I believe to be in the same area. At that time she had undergone previously a left greater saphenous vein ablation by Dr. Kellie Simmering and she had a ablation of the anterior accessory branch of the left greater saphenous vein in March 2016. Seeing that the wound actually closed over. In reviewing the history with her today the ulcer in this area has been recurrent. She describes a biopsy of this area in 2009 that only showed stasis physiology. 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 Do not see that she has ever had arterial studies in the left leg. She tells me she has a  follow-up with Dr. Kellie Simmering in roughly 10 days In any case she developed the reopening of this area roughly a month ago. On the background of this she describes rapidly increasing edema which has responded to Lasix 40 mg and metolazone 2.5 mg as well as the patient's lymph massage. She has been told she has both venous insufficiency and lymphedema but she cannot tolerate compression stockings 11/28/16; the patient saw Dr. Kellie Simmering recently. Per the patient he did arterial Dopplers in the office that did not show evidence of arterial insufficiency, per the patient he stated "treat this like an ordinary venous ulcer". She also saw her dermatologist Dr. Ronnald Ramp who felt that this was more of a vascular ulcer. In general things are improving although she arrives today with increasing bilateral lower extremity edema with weeping a deeper fluid through the wound on the left  medial leg compatible with some degree of lymphedema 12/04/16; the patient's wound is fully epithelialized but I don't think fully healed. We will do another week of depression with Promogran and TCA however I suspect we'll be able to discharge her next week. This is a very unusual-looking wound which was initially a figure-of-eight type wound lying on its side surrounded by petechial like hemorrhage. She has had venous ablation on this side. She apparently does not have an arterial issue per Dr. Kellie Simmering. She saw her dermatologist thought it was "vascular". Patient is definitely going to need ongoing compression and I talked about this with her today she will go to elastic therapy after she leaves here next week 12/11/16; the patient's wound is not completely closed today. She has surrounding scar tissue and in further discussion with the patient it would appear that she had ulcers in this area in 2009 for a prolonged period of time ultimately requiring a punch biopsy of this area that only showed venous insufficiency. I did not previously pickup  on this part of the history from the patient. 12/18/16; the patient's wound is completely epithelialized. There is no open area here. She has significant bilateral venous insufficiency with secondary lymphedema to a mild-to-moderate degree she does not have compression stockings.. She did not say anything to me when I was in the room, she told our intake nurse that she was still having pain in this area. This isn't unusual recurrent small open area. She is going to go to elastic therapy to obtain compression stockings. 12/25/16; the patient's wound is fully epithelialized. There is no open area here. The patient describes some continued episodic discomfort in this area medial left calf. However everything looks fine and healed here. She is been to elastic therapy and caught herself 15-20 mmHg stockings, they apparently were having trouble getting 20-30 mm stockings in her size 01/22/17; this is a patient we discharged from the clinic a month ago. She has a recurrent open wound on her medial left calf. She had 15 mm support stockings. I told her I thought she needed 20-30 mm compression stockings. She tells me that she has been ill with hospitalization secondary to asthma and is been found to have severe hypokalemia likely secondary to a combination of Lasix and metolazone. This morning she noted blistering and leaking fluid on the posterior part of her left leg. She called our intake nurse urgently and we was saw her this afternoon. She has not had any real discomfort here. I don't know that she's been wearing any stockings on this leg for at least 2-3 days. ABIs in this clinic were 1.21 on the right and 1.3 on the left. She is previously seen vascular surgery who does not think that there is a peripheral arterial issue. 01/30/17; Patient arrives with no open wound on the left leg. She has been to elastic therapy and obtained 20-73mmhg below knee stockings and she has one on the right leg  today. READMISSION 02/19/18; this [place] is a now 75 year old patient we've had in this clinic perhaps 3 times before. I had last looked at her from January 07 December 2016 with an area on the medial left leg. We discharged her on 12/25/16 however she had to be readmitted on 01/22/17 with a recurrence. I have in my notes that we discharged her on 20-30 mm stockings although she tells me she was only wearing support hose because she cannot get stockings on predominantly related to her cervical spine surgery/issues.  She has had previous ablations done by vein and vascular in [place] including a great saphenous vein ablation on the left with an anterior accessory branch ablation I think both of these were in 2016. On one of the previous visit she had a biopsy noted 2009 that was negative. She is not felt to have an arterial issue. She is not a diabetic. She does have a LAVANA, HUCKEBA. ([id number]) history of obstructive sleep apnea hypertension asthma as well as chronic venous insufficiency and lymphedema. On this occasion she noted 2 dry scaly patch on her left leg. She tried to put lotion on this it didn't really help. There were 2 open areas.the patient has been seeing her primary physician from 02/05/18 through 02/14/18. She had Unna boots applied. The superior wound now on the lateral left leg has closed but she's had one wound that remains open on the lateral left leg. This is not the same spot as we dealt with in 2018. ABIs in this clinic were 1.3 bilaterally 02/26/18; patient has a small wound on the left lateral calf. Dimensions are down. She has chronic venous insufficiency and lymphedema. 03/05/18; small open area on the left lateral calf. Dimensions are down. Tightly adherent necrotic debris over the surface of the wound which was difficult to remove. Also the dressing [over collagen] stuck to the wound surface. This was removed with some difficulty as well. Change the primary dressing to  Hydrofera Blue ready 03/12/18; small open area on the left lateral calf. Comes in with tightly adherent surface eschar as well as some adherent Hydrofera Blue. 03/19/18; open area on the left lateral calf. Again adherent surface eschar as well as some adherent Hydrofera Blue nonviable subcutaneous tissue. She complained of pain all week even with the reduction from 4-3 layer compression I put on last week. Also she had an increase in her ankle and calf measurements probably related to the same thing. 03/26/18; open area on the left lateral calf. A very small open area remains here. We used silver alginate starting last week as the Hydrofera Blue seem to stick to the wound bed. In using 4-layer compression 04/02/18; the open area in the left lateral calf at some adherent slough which I removed there is no open area here. We are able to transition her into her own compression stocking. Truthfully I think this is probably his support hose. However this does not maintain skin integrity will be limited. She cannot put over the toe compression stockings on because of neck problems hand problems etc. She is allergic to the lining layer of juxta lites. We might be forced to use extremitease stocking should this fail READMIT 11/24/2018 Patient is now a 75 year old woman who is not a diabetic. She has been in this clinic on at least 3 previous occasions largely with recurrent wounds on her left leg secondary to chronic venous insufficiency with secondary lymphedema. Her situation is complicated by inability to get stockings on and an allergy to neoprene which is apparently a component and at least juxta lites and other stockings. As a result she really has not been wearing any stockings on her legs. She tells [place] that roughly 2 or 3 weeks ago she started noticing a stinging sensation just above her ankle on the left medial aspect. She has been diagnosed with pseudogout and she wondered whether this was what she was  experiencing. She tried to dress this with something she bought at the store however subsequently it pulled skin off  and now she has an open wound that is not improving. She has been using Vaseline gauze with a cover bandage. She saw her primary doctor last week who put an Haematologist on her. ABIs in this clinic was 1.03 on the left 2/12; the area is on the left medial ankle. Odd-looking wound with what looks to be surface epithelialization but a multitude of small petechial openings. This clearly not closed yet. We have been using silver alginate under 3 layer compression with TCA 2/19; the wound area did not look quite as good this week. Necrotic debris over the majority of the wound surface which required debridement. She continues to have a multitude of what looked to be small petechial openings. She reminds [place] that she had a biopsy on this initially during her first outbreak in 2015 in [place] [facility]. She expresses concern about this being a possible melanoma. 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 I assured her that this does not look anything like melanoma. Except for the petechial reaction it does look like a venous insufficiency area and she certainly has evidence of this on both sides 2/26; a difficult area on the left medial ankle. The patient clearly has chronic venous hypertension with some degree of lymphedema. The odd thing about the area is the small petechial hemorrhages. I am not really sure how to explain this. This was present last time and this is not a compression injury. We have been using Hydrofera Blue which I changed to last week 3/4; still using Hydrofera Blue. Aggressive debridement today. She does not have known arterial issues. She has seen Dr. Kellie Simmering at [facility] vein and vascular and and has an ablation on the left. [Anterior accessory branch of the greater saphenous].  From what I remember they did not feel she had an arterial issue. The patient has had this area biopsied in 2009 at [facility] dermatology and by her recollection they said this was "stasis". She is also follow-up with dermatology locally who thought that this was more of a vascular issue 3/11; using Hydrofera Blue. Aggressive debridement today. She does not have an arterial issue. We are using 3 layer compression although we may need to go to 4. The patient has been in for multiple changes to her wrap since I last saw her a week ago. She says that the area was leaking. I do not have too much more information on what was found 01/19/19 on evaluation today patient was actually being seen for a nurse visit when unfortunately she had the area on her left lateral lower extremity as well as weeping from the right lower extremity that became apparent. Therefore we did end up actually seeing her for a full visit with myself. She is having some pain at this site as well but fortunately nothing too significant at this point. No fevers, chills, nausea, or vomiting noted at this time. 3/18-Patient is back to the clinic with the left leg venous leg ulcer, the ulcer is larger in size, has a surface that is densely adherent with fibrinous tissue, the Hydrofera Blue was used but is densely adherent and there was difficulty in removing it. The right lower extremity was also wrapped for weeping edema. Patient has a new area over the left lateral foot above the malleolus that is small and appears to have no debris with intact surrounding skin. Patient is on increased dose of Lasix also as  a means to edema management 3/25; the patient has a nonhealing venous ulcer on the medial left leg and last week developed a smaller area on the lateral left calf. We have been using Hydrofera Blue with a contact layer. 4/1; no major change in these wounds areas. Left medial and more recently left lateral calf. I tried Iodoflex last  week to aid in debridement she did not tolerate this. She stated her pain was terrible all week. She took the top layer of the 4 layer compression off. 4/8; the patient actually looks somewhat better in terms of her more prominent left lateral calf wound. There is some healthy looking tissue here. She is still complaining of a lot of discomfort. 4/15; patient in a lot of pain secondary to sciatica. She is on a prednisone taper prescribed by her primary physician. She has the 2 areas one on the left medial and more recently a smaller area on the left lateral calf. Both of these just above the malleoli 4/22; her back pain is better but she still states she is very uncomfortable and now feels she is intolerant to the [employer]. No real change in the wounds we have been using Sorbact. She has been previously intolerant to Iodoflex. There is not a lot of option about what we can use to debride this wound under compression that she no doubt needs. sHe states Ultram no longer works for her pain 4/29; no major change in the wounds slightly increased depth. Surface on the original medial wound perhaps somewhat improved however the more recent area on the lateral left ankle is 100% covered in very adherent debris we have been using Sorbact. She tolerates 4 layer compression well and her edema control is a lot better. She has not had to come in for a nurse check 5/6; no major change in the condition of the wounds. She did consent to debridement today which was done with some difficulty. Continuing Sorbact. She did not tolerate Iodoflex. She was in for a check of her compression the day after we wrapped her last week this was adjusted but [name], [name]. ([id number]) nothing much was found 5/13; no major change in the condition or area of the wounds. I was able to get a fairly aggressive debridement done on the lateral left leg wound. Even using Sorbact under compression. She came back in on Friday to have the  wrap changed. She says she felt uncomfortable on the lateral aspect of her ankle. She has a long history of chronic venous insufficiency including previous ablation surgery on this side. 5/20-Patient returns for wounds on left leg with both wounds covered in slough, with the lateral leg wound larger in size, she has been in 3 layer compression and felt more comfortable, she describes pain in ankle, in leg and pins and needles in foot, and is about to try Pamelor for this 6/3; wounds on the left lateral and left medial leg. The area medially which is the most recent of the 2 seems to have had the largest increase in dimensions. We have been using Sorbac to try and debride the surface. She has been to see orthopedics they apparently did a plain x-ray that was indeterminant. Diagnosed her with neuropathy and they have ordered an MRI to determine if there is underlying osteomyelitis. This was not high on my thought list but I suppose it is prudent. We have advised her to make an appointment with vein and vascular in [place]. She has a  history of a left greater saphenous and accessory vein ablations I wonder if there is anything else that can be done from a surgical point of view to help in these difficult refractory wounds. We have previously healed this wound on one occasion but it keeps on reopening [medial side] 6/10; deep tissue culture I did last week I think on the left medial wound showed both moderate E. coli and moderate staph aureus [MSSA]. She is going to require antibiotics and I have chosen Augmentin. We have been using Sorbact and we have made better looking wound surface on both sides but certainly no improvement in wound area. She was back in last Friday apparently for a dressing changes the wrap was hurting her outer left ankle. She has not managed to get a hold of vein and vascular in [place]. We are going to have to make her that appointment 6/17; patient is tolerating the  Augmentin. She had an MRI that I think was ordered by orthopedic surgeon this did not show osteomyelitis or an abscess did suggest cellulitis. We have been using Sorbact to the lateral and medial ankles. We have been trying to arrange a follow-up appointment with vein and vascular in [place] or did her original ablations. We apparently an area sent the request to vein and vascular in [facility] 6/24; patient has completed the Augmentin. We do not yet have a vein and vascular appointment in [place]. I am not sure what the issue is here we have asked her to call tomorrow. We are using Sorbact. Making some improvements and especially the medial wound. Both surfaces however look better medial and lateral. 7/1; the patient has been in contact with vein and vascular in [place] but has not yet received an appointment. Using Sorbact we have gradually improve the wound surface with no improvement in surface area. She is approved for Apligraf but the wound surface still is not completely viable. She has not had to come in for a dressing change 7/8; the patient has an appointment with vein and vascular on 7/31 which is a Friday afternoon. She is concerned about getting back here for [place] to dress her wounds. I think it is important to have them goal for her venous reflux/history of ablations etc. to see if anything else can be done. She apparently tested positive for 1 of the blood tests with regards to lupus and saw a rheumatologist. He has raised the issue of vasculitis again. I have had this thought in the past however the evidence seems overwhelming that this is a venous reflux etiology. If the rheumatologist tells me there is clinical and laboratory investigation is positive for lupus I will rethink this. 7/15; the patient's wound surfaces are quite a bit better. The medial area which was her original wound now has no depth although the lateral wound which was the more recent area actually appears  larger. Both with viable surfaces which is indeed better. Using Sorbact. I wanted to use Apligraf on her however there is the issue of the vein and vascular appointment on 7/31 at 2:00 in the afternoon which would not allow her to get back to be rewrapped and they would no doubt remove the graft 7/22; the patient's wound surfaces have moderate amount of debris although generally look better. The lateral one is larger with 2 small satellite areas superiorly. We are waiting for her vein and vascular appointment on 7/31. She has been approved for Apligraf which I would like to use after th 7/29;  wound surfaces have improved no debridement is required we have been using Sorbact. She sees vein and vascular on Friday with this so question of whether anything can be done to lessen the likelihood of recurrence and/or speed the healing of these areas. She is already had previous ablations. She no doubt has severe venous hypertension 8/5-Patient returns at 1 week, she was in [place] for 3 days by her podiatrist, we have been using so backed to the wound, she has increased pain in both the wounds on the left lower leg especially the more distal one on the lateral aspect 8/12-Patient returns at 1 week and she is agreeable to having debridement in both wounds on her left leg today. We have been using Sorbact, and vascular studies were reviewed at last visit 8/19; the patient arrives with her wounds fairly clean and no debridement is required. We have used Sorbact which is really done a nice job in cleaning up these very difficult wound surfaces. The patient saw Dr. Donzetta Matters of vascular surgery on 7/31. He did not feel that there was an arterial component. He felt that her treated greater saphenous vein is adequately addressed and that the small saphenous vein did not appear to be involved significantly. She was also noted to have deep venous reflux which is not treatable. Dr. Donzetta Matters mentioned the possibility of a  central obstructive component leading to reflux and he offered her central venography. She wanted to discuss this or think about it. I have urged her to go ahead with this. She has had recurrent difficult wounds in these areas which do heal but after months in the clinic. If there is anything that can be done to reduce the likelihood of this I think it is worth it. 9/2 she is still working towards getting follow-up with Dr. Donzetta Matters to schedule her CT. Things are quite a bit worse venography. I put Apligraf on 2 weeks ago on both wounds on the medial and lateral part of her left lower leg. She arrives in clinic today with 3 superficial additional wounds above the area laterally and one below the wound medially. She describes a lot of discomfort. I think these are probably wrapped injuries. Does not look like she has cellulitis. 07/20/2019 on evaluation today patient appears to be doing somewhat poorly in regard to her lower extremity ulcers. She in fact showed signs of erythema in fact we may even be dealing with an infection at this time. Unfortunately I am unsure if this is just infection or if indeed there may be some allergic reaction that occurred as a result of the Apligraf application. With that being said that would be unusual but nonetheless not impossible in this patient is one who is unfortunately allergic to quite a bit. Currently we have been using the Sorbact which seems to do as well as anything for her. I do think we may want to obtain a culture today to see if there is anything showing up there that may need to be addressed. 9/16; noted that last week the wounds look worse in 1 week follow-up of the Apligraf. Using Sorbact as of 2 days ago. She arrives with copious amounts of drainage and new skin breakdown on the back of the left calf. The wounds arm more substantial bilaterally. There is a fair amount of swelling in the left calf no overt DVT there is edema present I think in the left  greater than right thigh. She is supposed to go on 9/28  for CT venography. The wounds on the medial and lateral calf are worse and she has new skin breakdown posteriorly at least new for me. This is almost developing into a circumferential wound area The Apligraf was taken off last week which I agree with things are not going in the right direction a culture was done we do not have that back yet. She is on Augmentin that she started 2 days ago 9/23; dressing was changed by her nurses on Monday. In general there is no improvement in the wound areas although the area looks less angry than last week. She did get Augmentin for MSSA cultured on the 14th. She still appears to have too much swelling in the left leg even with 3 layer compression 9/30; the patient underwent her procedure on 9/28 by Dr. Donzetta Matters at vascular and vein specialist. She was discovered to have the common iliac vein measuring 12.2 mm but at the level of L4-L5 measured 3 mm. After stenting it measured 10 mm. 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 Rouleaux flow in the common femoral and femoral vein was observed much improved after stenting. We are using silver alginate to the wounds on the medial and lateral ankle on the left. 4 layer compression [name], [name] ([id number]) 10/7; the patient had fluid swelling around her knee and 4 layer compression. At the advice of vein and vascular this was reduced to 3 layer which she is tolerating better. We have been using silver alginate under 3 layer compression since last Friday 10/14; arrives with the areas on the left ankle looking a lot better. Inflammation in the area also a lot better. She came in for a nurse check on 10/9 10/21; continued nice improvement. Slight improvements in surface area of both the medial and lateral wounds on the left. A lot of the satellite lesions in the weeping erythema around these from stasis dermatitis is resolved. We have been using  silver alginate 10/28; general improvement in the entire wound areas although not a lot of change in dimensions the wound certainly looks better. There is a lot less in terms of venous inflammation. Continue silver alginate this week however look towards Hydrofera Blue next week 11/4; very adherent debris on the medial wound left wound is not as bad. We have been using silver alginate. Change to [facility] today 11/11; very adherent debris on both wound areas. She went to vein and vascular last week and follow-up they put in Kistler boot on this today. He says the [facility] was adherent. Wound is definitely not as good as last week. Especially on the left there the satellite lesions look more prominent 11/18; absolutely no better. erythema on lateral aspect with tenderness. 09/30/2019 on evaluation today patient appears to actually be doing better. Dr. Dellia Nims did put her on doxycycline last week which I do believe has helped her at this point. Fortunately there is no signs of active infection at this time. No fevers, chills, nausea, vomiting, or diarrhea. I do believe he may want extend the doxycycline for 7 additional days just to ensure everything does completely cleared up the patient is in agreement with that plan. Otherwise she is going require some sharp debridement today 12/2; patient is completing a 2-week course of doxycycline. I gave her this empirically for inflammation as well as infection when I last saw her 2 weeks ago. All of this seems to be better. She is using silver alginate she has the area on  the medial aspect of the larger area laterally and the 2 small satellite regions laterally above the major wound. 12/9; the patient's wound on the left medial and left lateral calf look really quite good. We have been using silver alginate. She saw vein and vascular in follow-up on 10/09/2019. She has had a previous left greater saphenous vein ablation by Dr. Oscar La in 2016. More  recently she underwent a left common iliac vein stent by Dr. 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 The swelling is improved and certainly the wounds have improved. The patient shows [place] today area on the right medial calf there is almost no wound but leaking lymphedema. She says she start this started 3 or 4 days ago. She did not traumatize it. It is not painful. She does not wear compression on that side 12/16; the patient continues to do well laterally. Medially still requiring debridement. The area on the right calf did not materialize to anything and is not currently open. We wrapped this last time. She has support stockings for that leg although I am not sure they are going to provide adequate compression 12/23; the lateral wound looks stable. Medially still requiring debridement for tightly adherent fibrinous debris. We've been using silver alginate. Surface area not any different 12/30; neither wound is any better with regards to surface and the area on the left lateral is larger. I been using silver alginate to the left lateral which look quite good last week and Sorbact to the left medial 11/11/2019. Lateral wound area actually looks better and somewhat smaller. Medial still requires a very aggressive debridement today. We have been using Sorbact on both wound areas 1/13; not much better still adherent debris bilaterally. I been using Sorbact. She has severe venous hypertension. Probably some degree of dermal fibrosis distally. I wonder whether tighter compression might help and I am going to try that today. We also need to work on the bioburden 1/20; using Sorbact. She has severe venous hypertension status post stent placement for pelvic vein compression. We applied gentamicin last time to see if we could reduce bioburden I had some discussion with her today about the use of pentoxifylline. This is occasionally used in this setting for wounds with refractory  venous insufficiency. However this interacts with Plavix. She tells me that she was put on this after stent placement for 3 months. She will call Dr. Claretha Cooper office to discuss 1/27; we are using gentamicin under Sorbact. 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 She has a stent. Wound medially is measuring smaller this week. Laterally measuring slightly larger although she has some satellite lesions superiorly 2/3; gentamicin under Sorbact under 4-layer compression. 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 She has a stent on Plavix. Her wounds are measuring smaller this week. More substantially laterally where there is a satellite lesion superiorly. 2/10; gentamicin under Sorbac. 4-layer compression. Patient communicated with Dr. Donzetta Matters at vein and vascular in [place]. He is okay with the patient coming off Plavix I will therefore start her on pentoxifylline for a 1 month trial. In general her wounds look better today. I had some concerns about swelling in the left thigh however she measures 61.5 on the right and 63 on the mid thigh which does not suggest there is any difficulty. The patient is not describing any pain. 2/17; gentamicin under Sorbac 4-layer compression. She has been on pentoxifylline for 1 week and complains of loose stool. No nausea she is  eating and drinking well 2/24; the patient apparently came in 2 days ago for a nurse visit when her wrap fell down. Both areas look a little worse this week macerated medially and satellite lesions laterally. Change to silver alginate today 3/3; wounds are larger today especially medially. She also has more swelling in her foot lower leg and I even noted some swelling in her posterior thigh which is tender. I wonder about the patency of her stent. Fortuitously she sees Dr. Claretha Cooper group on Friday 3/10; Mrs. Ogletree was seen by vein and vascular on 3/5. The patient underwent ultrasound. There was  no evidence of thrombosis involving the IVC no evidence of thrombosis involving the right common iliac vein there is no evidence of thrombosis involving the right external iliac vein the left external vein is also patent. The right common iliac vein stent appears patent bilateral common femoral veins are compressible and appear patent. I was concerned about the left common iliac stent however it looks like this is functional. She has some edema in the posterior thigh that was tender she still has that this week. I also note they had trouble finding the pulses in her left foot and booked her for an ABI baseline in 4 weeks. She will follow up in 6 months for repeat IVC duplex. The patient stopped the pentoxifylline because of diarrhea. It does not look like that was being effective in any case. I have advised her to go back on her aspirin 81 mg tablet, vascular it also suggested this 3/17; comes in today with her wound surfaces a lot better. The excoriations from last week considerably better probably secondary to the TCA. We have been using silver alginate 3/24; comes in today with smaller wounds both medially and laterally. Both required debridement. There are 2 small satellite areas superiorly laterally. She also has a very odd bandlike area in the mid calf almost looking like there was a weakness in the wrap in a localized area. I would write this off as being this however anteriorly she has a small raised ballotable area that is very tender almost reminiscent of an abscess but there was no AUTUMM, HATTERY. ([id number]) obvious purulent surface to it. 02/04/20 upon evaluation today patient appears to be doing fairly well in regard to her wounds today. Fortunately there is no signs of active infection at this time. No fevers, chills, nausea, vomiting, or diarrhea. She has been tolerating the dressing changes without complication. Fortunately I feel like she is showing signs of improvement although  has been sometime since have seen her. Nonetheless the area of concern that Dr. Dellia Nims had last week where she had possibly an area of the wrap that was we can allow the leg to bulge appears to be doing significantly better today there is no signs of anything worsening. 4/7; the patient's wounds on her medial and lateral left leg continue to contract. We have been using a regular alginate. Last week she developed an area on the right medial lower leg which is probably a venous ulcer as well. 4/14; the wounds on her left medial and lateral lower leg continue to contract. Surface eschar. We have been using regular alginate. The area on the right medial lower leg is closed. We have been putting both legs under 4-layer contraction. The patient went back to see vein and vascular she had arterial studies done which were apparently "quite good" per the patient although I have not read their notes I have never felt  she had an arterial issue. 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 This is been longstanding and refractory to exercise, leg elevation and longstanding use of compression wraps in our clinic as well as compression stockings on the times we have been able to get these to heal 4/21; we thought she actually might be close this week however she arrives in clinic with a lot of edema in her upper left calf and into her posterior thigh. This is been an intermittent problem here. She says the wrap fell down but it was replaced with a nurse visit on Monday. We are using calcium alginate to the wounds and the wound sizes there not terribly larger than last week but there is a lot more edema 4/28; again wound edges are smaller on both sides. Her edema is better controlled than last time. She is obtained her compression pumps from [employer] although they have not been to her home to set these up. 5/5; left medial and left lateral both look stable. I am  not sure the medial is any smaller. We have been using calcium alginate under 4-layer compression. She had an area on the right medial. This was eschared today. We have been wrapping this as well. She does not tolerate external compression stockings due to a history of various contact allergies. She has her compression pumps however the representative from the company is coming on her to show her how to use these tomorrow 5/19; patient with severe chronic venous insufficiency secondary to central venous disease. She had a stent placed in her left common iliac vein. She has done better since but still difficult to control wounds. She comes in today with nothing open on the right leg. Her areas on the left medial and left lateral are just about closed. We are using calcium alginate under 4-layer compression. She is using her external compression pumps at home She only has 15-20 support stockings. States she cannot get anything tighter than that on. 03/30/20-Patient returns at 1 week, the wounds on the left leg are both slightly bigger, the last week she was on 3 layer compression which started to slide down. She is starting to use her lymphedema pumps although she stated on 1 day her right ankle started to swell up and she have to stop that day. Unfortunately the open area seem to oscillate between improving to the point of healing and then flaring up all to do with effectiveness of compression or lack of due to the left leg topography not keeping the compression wraps from rolling down 6/2 patient comes in with a 15/20 mmHg stocking on the right leg. She tells me that she developed a lot of swelling in her ankles she saw orthopedics she was felt to possibly be having a flare of pseudogout versus some other type of arthritis. She was put on steroids for a respiratory issue so that helps with the inflammation. She has not been using the pumps all week. She thinks the left thigh is more swollen than  usual and I would agree with that. She has an appointment with Dr. Donzetta Matters 9 days or so from now 6/9; both wounds on the left medial and left lateral are smaller. We have been using calcium alginate under compression. She does not have an open wound on the right leg she is using a stocking and her compression pumps things are going well. She has an appointment with Dr. Donzetta Matters with regards to her stent in  the left common iliac vein 6/16; the wounds on the left medial and left lateral ankle continues to contract. The patient saw Dr. Donzetta Matters and I think he seems satisfied. Ordered follow-up venous reflux studies on both sides in September. Cautioned that she may need thigh-high stockings. She has been using calcium alginate under compression on the left and her own stocking on the right leg. She tells [place] there are no open wounds on the right 6/23; left lateral is just about closed. Medial required debridement today. We have been using calcium alginate. Extensive discussion about the compression pumps she is only using these on 25 mmHg states she could not take 40 or 30 when the wrap came out to her home to demonstrate these. He said they should not feel tight 6/30; the left lateral wound has a slight amount of eschar. . The area medially is about the same using Hydrofera Blue. 7/7; left lateral wound still has some eschar. I will remove this next week may be closed. The area medially is very small using Hydrofera Blue with improvement. Unfortunately the stockings fell down. Unfortunately the blisters have developed at the edge of where the wrap fell. When this happened she says her legs hurt she did not use her pumps. We are not open Monday for her to come in and change the wraps and she had an appointment yesterday. She also tells me that she is going to have an MRI of her back. She is having pain radiating into her left anterior leg she thinks her from an L5 disc. She saw Dr. Ellene Route of neurosurgery 7/14;  the area on the left lateral ankle area is closed. Still a small area medially however it looks better as well. We have been using Hydrofera Blue under 4-layer compression 7/21; left lateral ankle is still closed however her wound on the medial left calf is actually larger. This is probably because Hydrofera Blue got stuck to the wound. She came in for a nurse change on Friday and will do that again this week I was concerned about the amount of swelling that she had last week however she is using her compression pumps twice a day and the swelling seems well controlled 7/28; remaining wound on the left medial lower leg is smaller. We have been using moistened silver collagen under compression she is coming back for a nurse visit. For reasons that were not really clear she was just keeping her legs elevated and not using her compression pumps. I have asked her to use the compression pumps. She does not have any wounds on the right leg 06/15/20-Patient returns at 2 weeks, her LLE edema is worse and she developed a blister wound that is new and has bigger posterior calf wound on right, we are using Prisma with pad, 4 layer compression. she has been on lasix 40 mg daily 8/18; patient arrives today with things a lot worse than I remember from a few weeks ago. She was seen last week. Noted that her edema was worse and that she now had a left lateral wound as well as deteriorating edema in the medial and posterior part of the lower leg. She says she is using his or her external compression pumps once a day although I wonder about the compliance. 8/25; weeping area on the right medial lower leg. This had actually gotten a small localized area of her compression stocking wet. On the left side there is a large denuded area on the posterior medial lower  leg and smaller area on the lateral. This was not the original areas that we dealt with. 9/1 the patient's wound on the left leg include the left lateral and left  posterior. Larger superficial wounds weeping. She has very poor edema control. Tender localized edema in the left lower medial ankle/heel probably because of localized wrap issues. She freely admits she is not using [place], [place] ([id number]) the compression pumps. She has been up on her feet a lot. She thinks the hydrofera blue is contributing to the pain she is experiencing.. This is a complaint that I have occasionally heard 9/8; really not much improvement. The patient is still complaining of a lot of pain particularly when she uses compression pumps. I switched her to silver alginate last time because she found the Hydrofera Blue to be irritating. I don't hear much difference in her description with the silver alginate. 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 She has follow-up with Dr. Donzetta Matters I think for ultrasound next month 9/15; quite a bit of improvement today. We have less edema and more epithelialization in both of her wound areas on the left medial and left lateral calf. These are not the site of her original wounds in this area. She says she has been using her compression pumps for 30 minutes twice a day, there pain issues that never quite understood. Silver alginate as the primary dressing 9/22; continued improvement. Both areas medially and laterally still have a small open area there is some eschar. She continues to complain of left medial ankle pain. Swelling in the leg is in much better condition. We have been using silver alginate 9/29; continued improvement. Both areas medially and laterally in the left calf look as though they are close some minor surface eschar but I think this is epithelialized. She comes in today saying she has a ruptured disc at L4-L5 cannot bend over to put on her stockings. 10/6; patient comes in today with no open wounds on either leg. However her edema on the left leg in the  upper one third of the lower leg is poorly controlled nonpitting. She says that she could not use the pumps for 2 days and then she has been using the last couple of days. It is not clear to me she has been able to get her stocking on. She has back problems. Mrs. Monteleone has severe chronic venous insufficiency with secondary lymphedema. Her venous insufficiency is partially centrally mediated and that she is now post stent in the left common iliac vein. [facility] Thurner's syndrome/physiology]. She follows up with them on 10/15. She wears 20/30 below-knee stockings. She is supposed to use compression pumps at home although I think her compliance about with this is been less than 100%. I have asked her to use these 3 times a day. Finally I think she has lipodermatosclerosis in the left lower leg with an inverted bottle sign. It is been a major problem controlling the edema in the left leg. The right leg we have had wounds on but not as significant a problem is on the left READMISSION 04/12/2021 Mrs. Schorr is a 75 year old woman we know well in this clinic. She has severe chronic venous insufficiency. 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 I believe she has had bilateral greater saphenous vein ablation in the past as well. She tells me  that this wound opened sometime in March. She had a fall and thinks it was initially abrasion. She developed areas she describes as little blisters on the anterior part of her leg and she saw dermatology and was treated for methicillin staph aureus with several rounds of antibiotics. She has been using support stockings on the left leg and says this is the only thing she can get on. Her compression pump use maybe once a day she says if she did not use one she use the other. She comes in today with incredible swelling in the left leg with a wound on the left posterior calf. She has been using Neosporin to this previously a  hydrocolloid. 6/15; patient arrives back for 1 week follow-up.Marland Kitchen Apparently her wrap fell down she did not call us to replace this. He has poor edema control. She only uses her compression pumps once a day 6/29; patient presents for 1 week follow-up. She has tolerated the compression wrap well. We have been using Iodoflex under the wrap. She has no issues or complaints today. 7/6; patient presents for 1 week follow-up. She states that the compression wrap rolled down her leg 4 days ago. She has been trying to keep the area covered but has no dressings at home to use. She denies signs of infection. 7/13; patient presents for 1 week follow-up. She states that her compression wrap rolled down her right leg and she called our office and had it placed a few days ago. She has been tolerating the current wrap well. She states that the Iodoflex is causing a burning sensation. She denies signs of infection. Patient History Information obtained from Patient. Family History Cancer - Mother,Paternal Grandparents, Hypertension - Mother,Father, Kidney Disease - Paternal Grandparents, Lung Disease - Paternal Grandparents, No family history of Diabetes, Heart Disease, Hereditary Spherocytosis, Seizures, Stroke, Thyroid Problems, Tuberculosis. Social History Former smoker - quit at age 71 - ended on 04/19/1966, Marital Status - Divorced, Alcohol Use - Never, Drug Use - No History, Caffeine Use - Daily. Medical History Eyes Patient has history of Cataracts Denies history of Glaucoma, Optic Neuritis Ear/Nose/Mouth/Throat Denies history of Chronic sinus problems/congestion, Middle ear problems Hematologic/Lymphatic Denies history of Anemia, Hemophilia, Human Immunodeficiency Virus, Lymphedema, Sickle Cell Disease Respiratory Patient has history of Asthma, Sleep Apnea Denies history of Aspiration, Chronic Obstructive Pulmonary Disease (COPD), Pneumothorax, Tuberculosis Cardiovascular Patient has history of  Deep Vein Thrombosis - LLE 2010, Hypertension, Peripheral Venous Disease Denies history of Angina, Arrhythmia, Congestive Heart Failure, Coronary Artery Disease, Hypotension, Myocardial Infarction, Peripheral Arterial Disease, Phlebitis, Vasculitis Gastrointestinal Denies history of Cirrhosis , Colitis, Crohn s, Hepatitis A, Hepatitis B, Hepatitis C Endocrine Denies history of Type I Diabetes, Type II Diabetes Genitourinary AMARIYA, LISKEY ([id number]) Denies history of End Stage Renal Disease Immunological Denies history of Lupus Erythematosus, Raynaud s, Scleroderma Integumentary (Skin) Denies history of History of Burn, History of pressure wounds Musculoskeletal Patient has history of Osteoarthritis - neck Denies history of Gout, Rheumatoid Arthritis, Osteomyelitis Neurologic Denies history of Dementia, Neuropathy, Quadriplegia, Paraplegia, Seizure Disorder Oncologic Patient has history of Received Chemotherapy - interfeon immunotherapy, Received Radiation Psychiatric Denies history of Anorexia/bulimia, Confinement Anxiety Hospitalization/Surgery History - Melanoma left shoulder. Medical And Surgical History Notes Constitutional Symptoms (General Health) Vein ablation LLE 2010 Vascular Sx ( vein ablationo) LLE 02/2015 Eyes Horners syndrome Genitourinary Kidney stones Neurologic ct scan showed swollen lymph nodes Oncologic Melanoma (R) shoulder 07/2010; (R) axillary lymphnode removal Objective Constitutional Vitals Time Taken: 1:45 PM, Height: 63 in, Weight:  212 lbs, BMI: 37.6, Temperature: 98.3 F, Pulse: 80 bpm, Respiratory Rate: 18 breaths/min, Blood Pressure: 165/90 mmHg. General Notes: Left lower extremity: To the posterior aspect there is an open wound with Granulation tissue and nonviable tissue present. No signs of infection Integumentary (Hair, Skin) Wound #13 status is Open. Original cause of wound was Gradually Appeared. The date acquired was: 02/24/2021. The  wound has been in treatment 5 weeks. The wound is located on the Left,Distal,Posterior Lower Leg. The wound measures 2.4cm length x 2.2cm width x 0.1cm depth; 4.147cm^2 area and 0.415cm^3 volume. There is Fat Layer (Subcutaneous Tissue) exposed. There is no tunneling or undermining noted. There is a medium amount of serosanguineous drainage noted. There is medium (34-66%) pink granulation within the wound bed. There is a medium (34-66%) amount of necrotic tissue within the wound bed including Adherent Slough. Assessment Active Problems ICD-10 Chronic venous hypertension (idiopathic) with ulcer and inflammation of left lower extremity Lymphedema, not elsewhere classified Non-pressure chronic ulcer of left calf limited to breakdown of skin Patient's wound has shown improvement in size and appearance since last clinic visit. She has noticed burning to her wound site with Iodoflex. We will stop this today. I debrided nonviable tissue. No signs of infection. I recommended using Santyl and Hydrofera Blue under 3 layer compression. I would also like to Try a skin substitute And we will run this through insurance today. MARGORIE, [name] ([id number]) Procedures Wound #13 Pre-procedure diagnosis of Wound #13 is a Venous Leg Ulcer located on the Left,Distal,Posterior Lower Leg .Severity of Tissue Pre Debridement is: Fat layer exposed. There was a Excisional Skin/Subcutaneous Tissue Debridement with a total area of 5.28 sq cm performed by Kalman Shan, MD. With the following instrument(s): Curette to remove Viable and Non-Viable tissue/material. Material removed includes Subcutaneous Tissue, Slough, Skin: Dermis, and Skin: Epidermis after achieving pain control using Lidocaine 4% Topical Solution. No specimens were taken. A time out was conducted at 14:32, prior to the start of the procedure. A Minimum amount of bleeding was controlled with Pressure. The procedure was tolerated well with a pain level of  0 throughout and a pain level of 0 following the procedure. Post Debridement Measurements: 2.4cm length x 2.2cm width x 0.1cm depth; 0.415cm^3 volume. Character of Wound/Ulcer Post Debridement is improved. Severity of Tissue Post Debridement is: Fat layer exposed. Post procedure Diagnosis Wound #13: Same as Pre-Procedure Pre-procedure diagnosis of Wound #13 is a Venous Leg Ulcer located on the Left,Distal,Posterior Lower Leg . There was a Three Layer Compression Therapy Procedure by Carlene Coria, RN. Post procedure Diagnosis Wound #13: Same as Pre-Procedure Plan Follow-up Appointments: Wound #13 Left,Distal,Posterior Lower Leg: Return Appointment in 1 week. Nurse Visit as needed Bathing/ Shower/ Hygiene: May shower with wound dressing protected with water repellent cover or cast protector. Anesthetic (Use 'Patient Medications' Section for Anesthetic Order Entry): Lidocaine applied to wound bed Edema Control - Lymphedema / Segmental Compressive Device / Other: Elevate, Exercise Daily and Avoid Standing for Long Periods of Time. Elevate legs to the level of the heart and pump ankles as often as possible Elevate leg(s) parallel to the floor when sitting. Compression Pump: Use compression pump on left lower extremity for 60 minutes, twice daily. - TWICE DAILY for 1 hour Compression Pump: Use compression pump on right lower extremity for 60 minutes, twice daily. - TWICE DAILY for 1 hour WOUND #13: - Lower Leg Wound Laterality: Left, Posterior, Distal Cleanser: Soap and Water 1 x Per Week/30 Days Discharge Instructions: Gently  cleanse wound with antibacterial soap, rinse and pat dry prior to dressing wounds Topical: Santyl Collagenase Ointment, 30 (gm), tube (Home Health) 1 x Per Week/30 Days Discharge Instructions: apply nickel thick to wound bed only Primary Dressing: Hydrofera Blue Ready Transfer Foam, 2.5x2.5 (in/in) (DME) (Generic) 1 x Per Week/30 Days Discharge Instructions: Apply  Hydrofera Blue Ready to wound bed as directed Secondary Dressing: ABD Pad 5x9 (in/in) 1 x Per Week/30 Days Discharge Instructions: Cover with ABD pad Compression Wrap: Profore Lite LF 3 Multilayer Compression Bandaging System 1 x Per Week/30 Days Discharge Instructions: Apply 3 multi-layer wrap as prescribed. 1. In office sharp debridement 2. Hydrofera Blue and Santyl under 3 layer compression 3. Run skin substitute through insurance 4. Follow-up in 1 week Electronic Signature(s) Signed: 05/17/2021 4:20:24 PM By: Kalman Shan DO Entered By: Kalman Shan on 05/17/2021 16:19:06 Aughenbaugh, [name] ([id number]) -------------------------------------------------------------------------------- ROS/PFSH Details Patient Name: SHARONA, ROVNER. Date of Service: 05/17/2021 1:30 PM Medical Record Number: [id number] Patient Account Number: [id number] Date of Birth/Sex: 03-21-1946 (75 y.o. F) Treating RN: Cornell Barman Primary Care Provider: [name] Other Clinician: Referring Provider: [name] Treating Provider/Extender: Yaakov Guthrie in Treatment: 5 Label Progress Note Print Version as History and Physical for this encounter Information Obtained From Patient Constitutional Symptoms (General Health) Medical History: Past Medical History Notes: Vein ablation LLE 2010 Vascular Sx ( vein ablationo) LLE 02/2015 Eyes Medical History: Positive for: Cataracts Negative for: Glaucoma; Optic Neuritis Past Medical History Notes: Horners syndrome Ear/Nose/Mouth/Throat Medical History: Negative for: Chronic sinus problems/congestion; Middle ear problems Hematologic/Lymphatic Medical History: Negative for: Anemia; Hemophilia; Human Immunodeficiency Virus; Lymphedema; Sickle Cell Disease Respiratory Medical History: Positive for: Asthma; Sleep Apnea Negative for: Aspiration; Chronic Obstructive Pulmonary Disease (COPD); Pneumothorax; Tuberculosis Cardiovascular Medical  History: Positive for: Deep Vein Thrombosis - LLE 2010; Hypertension; Peripheral Venous Disease Negative for: Angina; Arrhythmia; Congestive Heart Failure; Coronary Artery Disease; Hypotension; Myocardial Infarction; Peripheral Arterial Disease; Phlebitis; Vasculitis Gastrointestinal Medical History: Negative for: Cirrhosis ; Colitis; Crohnos; Hepatitis A; Hepatitis B; Hepatitis C Endocrine Medical History: Negative for: Type I Diabetes; Type II Diabetes Genitourinary Medical History: Negative for: End Stage Renal Disease Past Medical History Notes: Kidney stones ANNALI, LYBRAND ([id number]) Immunological Medical History: Negative for: Lupus Erythematosus; Raynaudos; Scleroderma Integumentary (Skin) Medical History: Negative for: History of Burn; History of pressure wounds Musculoskeletal Medical History: Positive for: Osteoarthritis - neck Negative for: Gout; Rheumatoid Arthritis; Osteomyelitis Neurologic Medical History: Negative for: Dementia; Neuropathy; Quadriplegia; Paraplegia; Seizure Disorder Past Medical History Notes: ct scan showed swollen lymph nodes Oncologic Medical History: Positive for: Received Chemotherapy - interfeon immunotherapy; Received Radiation Past Medical History Notes: Melanoma (R) shoulder 07/2010; (R) axillary lymphnode removal Psychiatric Medical History: Negative for: Anorexia/bulimia; Confinement Anxiety HBO Extended History Items Eyes: Cataracts Immunizations Pneumococcal Vaccine: Received Pneumococcal Vaccination: Yes Immunization Notes: tetanus shot w/in the last 5 years per pt Implantable Devices None Hospitalization / Surgery History Type of Hospitalization/Surgery Melanoma left shoulder Family and Social History Cancer: Yes - Mother,Paternal Grandparents; Diabetes: No; Heart Disease: No; Hereditary Spherocytosis: No; Hypertension: Yes - Mother,Father; Kidney Disease: Yes - Paternal Grandparents; Lung Disease: Yes - Paternal  Grandparents; Seizures: No; Stroke: No; Thyroid Problems: No; Tuberculosis: No; Former smoker - quit at age 42 - ended on 04/19/1966; Marital Status - Divorced; Alcohol Use: Never; Drug Use: No History; Caffeine Use: Daily; Financial Concerns: No; Food, Clothing or Shelter Needs: No; Support System Lacking: No; Transportation Concerns: No Electronic Signature(s) Signed: 05/17/2021 4:20:24 PM By: Kalman Shan DO Signed: 05/18/2021  4:19:16 PM By: [name], [name], RN, CWS, [name], [name] Entered By: Kalman Shan on 05/17/2021 15:21:16 DESARE, DUDDY ([id number]) -------------------------------------------------------------------------------- SuperBill Details Patient Name: SHANTAYA, BLUESTONE. Date of Service: 05/17/2021 Medical Record Number: [id number] Patient Account Number: [id number] Date of Birth/Sex: [birth year] (75 y.o. F) Treating RN: Carlene Coria Primary Care Provider: [name] Other Clinician: Referring Provider: [name] Treating Provider/Extender: Yaakov Guthrie in Treatment: 5 Diagnosis Coding ICD-10 Codes Code Description [phone] Chronic venous hypertension (idiopathic) with ulcer and inflammation of left lower extremity I89.0 Lymphedema, not elsewhere classified L97.221 Non-pressure chronic ulcer of left calf limited to breakdown of skin Facility Procedures CPT4 Code Description: [id number] - DEB SUBQ TISSUE 20 SQ CM/< Modifier: Quantity: 1 CPT4 Code Description: ICD-10 Diagnosis Description I87.332 Chronic venous hypertension (idiopathic) with ulcer and inflammation of l Modifier: eft lower extremity Quantity: Physician Procedures CPT4 Code Description: [id number] - WC PHYS SUBQ TISS 20 SQ CM Modifier: Quantity: 1 CPT4 Code Description: ICD-10 Diagnosis Description I87.332 Chronic venous hypertension (idiopathic) with ulcer and inflammation of l Modifier: eft lower extremity Quantity: Electronic Signature(s) Signed: 05/17/2021 4:20:24 PM By:  Kalman Shan DO Entered By: Kalman Shan on 05/17/2021 16:19:37

## 2021-05-22 NOTE — Progress Notes (Signed)
Amber, [name] ([id number]) Visit Report for 05/03/2021 Arrival Information Details Patient Name: Amber, [name]. Date of Service: 05/03/2021 2:30 PM Medical Record Number: [id number] Patient Account Number: [id number] Date of Birth/Sex: [birth year] (75 y.o. F) Treating RN: Donnamarie Poag Primary Care Jaaziah Schulke: [name] Other Clinician: Referring Micha Dosanjh: [name] Treating Jenia Klepper/Extender: Yaakov Guthrie in Treatment: 3 Visit Information History Since Last Visit Added or deleted any medications: No Patient Arrived: Ambulatory Had a fall or experienced change in No Arrival Time: 14:50 activities of daily living that may affect Accompanied By: self risk of falls: Transfer Assistance: None Hospitalized since last visit: No Patient Identification Verified: Yes Has Dressing in Place as Prescribed: Yes Secondary Verification Process Completed: Yes Has Compression in Place as Prescribed: Yes Patient Has Alerts: Yes Pain Present Now: Yes Patient Alerts: Patient on Blood Thinner ASPIRIN NOT diabetic Electronic Signature(s) Signed: 05/03/2021 3:28:20 PM By: Donnamarie Poag Entered ByDonnamarie Poag on 05/03/2021 14:57:58 [name] ([id number]) -------------------------------------------------------------------------------- Compression Therapy Details Patient Name: Amber, [name]. Date of Service: 05/03/2021 2:30 PM Medical Record Number: [id number] Patient Account Number: [id number] Date of Birth/Sex: 10-25-1946 (75 y.o. F) Treating RN: Cornell Barman Primary Care Rainier Feuerborn: [name] Other Clinician: Referring Collie Kittel: [name] Treating Ellagrace Yoshida/Extender: Yaakov Guthrie in Treatment: 3 Compression Therapy Performed for Wound Assessment: Wound #13 Left,Distal,Posterior Lower Leg Performed By: Clinician Cornell Barman, RN Compression Type: Three Layer Pre Treatment ABI: 0.9 Post Procedure Diagnosis Same as Pre-procedure Electronic  Signature(s) Signed: 05/22/2021 1:36:15 PM By: [name], [name], RN, CWS, [name], [name] Entered By: [name], [name], RN, CWS, Kim on 05/03/2021 15:14:00 [name] ([id number]) -------------------------------------------------------------------------------- Encounter Discharge Information Details Patient Name: Amber, [name]. Date of Service: 05/03/2021 2:30 PM Medical Record Number: [id number] Patient Account Number: [id number] Date of Birth/Sex: [birth year] (75 y.o. F) Treating RN: Carlene Coria Primary Care Karsyn [name]: [name] Other Clinician: Referring Ivy Puryear: [name] Treating Caddie Randle/Extender: Yaakov Guthrie in Treatment: 3 Encounter Discharge Information Items Discharge Condition: Stable Ambulatory Status: Ambulatory Discharge Destination: Home Transportation: Private Auto Accompanied By: self Schedule Follow-up Appointment: Yes Clinical Summary of Care: Patient Declined Electronic Signature(s) Signed: 05/04/2021 6:25:03 PM By: Carlene Coria RN Entered By: Carlene Coria on 05/03/2021 15:25:57 [name] ([id number]) -------------------------------------------------------------------------------- Lower Extremity Assessment Details Patient Name: Amber, [name]. Date of Service: 05/03/2021 2:30 PM Medical Record Number: [id number] Patient Account Number: [id number] Date of Birth/Sex: 10-Nov-1945 (75 y.o. F) Treating RN: Donnamarie Poag Primary Care Dorance Spink: [name] Other Clinician: Referring Eino Whitner: [name] Treating Amethyst Gainer/Extender: Yaakov Guthrie in Treatment: 3 Edema Assessment Assessed: [Left: Yes] [Right: No] Edema: [Left: Ye] [Right: s] Calf Left: Right: Point of Measurement: 32 cm From Medial Instep 52.5 cm Ankle Left: Right: Point of Measurement: 10 cm From Medial Instep 23 cm Knee To Floor Left: Right: From Medial Instep 40 cm Vascular Assessment Pulses: Dorsalis Pedis Palpable: [Left:Yes] Electronic  Signature(s) Signed: 05/03/2021 3:28:20 PM By: Donnamarie Poag Entered By: Donnamarie Poag on 05/03/2021 15:00:55 [name] ([id number]) -------------------------------------------------------------------------------- Multi Wound Chart Details Patient Name: Amber, [name]. Date of Service: 05/03/2021 2:30 PM Medical Record Number: [id number] Patient Account Number: [id number] Date of Birth/Sex: [birth year] (75 y.o. F) Treating RN: Cornell Barman Primary Care Maecy Podgurski: [name] Other Clinician: Referring Rim Thatch: [name] Treating Eliyahu Bille/Extender: Yaakov Guthrie in Treatment: 3 Vital Signs Height(in): 63 Pulse(bpm): 81 Weight(lbs): 212 Blood Pressure(mmHg): 180/82 Body Mass Index(BMI): 38 Temperature(F): 98.5 Respiratory Rate(breaths/min): 20 Photos: [N/A:N/A] Wound Location: Left, Distal, Posterior Lower Leg N/A N/A Wounding  Event: Gradually Appeared N/A N/A Primary Etiology: Venous Leg Ulcer N/A N/A Comorbid History: Cataracts, Asthma, Sleep Apnea, N/A N/A Deep Vein Thrombosis, Hypertension, Peripheral Venous Disease, Osteoarthritis, Received Chemotherapy, Received Radiation Date Acquired: 02/24/2021 N/A N/A Weeks of Treatment: 3 N/A N/A Wound Status: Open N/A N/A Measurements L x W x D (cm) 2x2.5x0.1 N/A N/A Area (cm) : 3.927 N/A N/A Volume (cm) : 0.393 N/A N/A % Reduction in Area: 33.30% N/A N/A % Reduction in Volume: 33.30% N/A N/A Classification: Full Thickness Without Exposed N/A N/A Support Structures Exudate Amount: Large N/A N/A Exudate Type: Serosanguineous N/A N/A Exudate Color: red, brown N/A N/A Granulation Amount: Small (1-33%) N/A N/A Granulation Quality: Red, Hyper-granulation N/A N/A Necrotic Amount: Large (67-100%) N/A N/A Exposed Structures: Fat Layer (Subcutaneous Tissue): N/A N/A Yes Fascia: No Tendon: No Muscle: No Joint: No Bone: No Procedures Performed: Compression Therapy N/A N/A Treatment Notes Electronic  Signature(s) Signed: 05/03/2021 3:26:16 PM By: Kalman Shan DO Entered By: Kalman Shan on 05/03/2021 15:17:22 [name] ([id number]) -------------------------------------------------------------------------------- Multi-Disciplinary Care Plan Details Patient Name: Amber, [name]. Date of Service: 05/03/2021 2:30 PM Medical Record Number: [id number] Patient Account Number: [id number] Date of Birth/Sex: 03-20-1946 (75 y.o. F) Treating RN: Cornell Barman Primary Care Asheton Scheffler: [name] Other Clinician: Referring Laina Guerrieri: [name] Treating Lynisha Osuch/Extender: Yaakov Guthrie in Treatment: 3 Active Inactive Necrotic Tissue Nursing Diagnoses: Impaired tissue integrity related to necrotic/devitalized tissue Knowledge deficit related to management of necrotic/devitalized tissue Goals: Necrotic/devitalized tissue will be minimized in the wound bed Date Initiated: 04/12/2021 Target Resolution Date: 04/27/2021 Goal Status: Active Interventions: Assess patient pain level pre-, during and post procedure and prior to discharge Provide education on necrotic tissue and debridement process Treatment Activities: Apply topical anesthetic as ordered : 04/12/2021 Notes: Orientation to the Wound Care Program Nursing Diagnoses: Knowledge deficit related to the wound healing center program Goals: Patient/caregiver will verbalize understanding of the [place] Date Initiated: 04/12/2021 Target Resolution Date: 05/04/2021 Goal Status: Active Interventions: Provide education on orientation to the wound center Notes: Venous Leg Ulcer Nursing Diagnoses: Actual venous Insuffiency (use after diagnosis is confirmed) Knowledge deficit related to disease process and management Potential for venous Insuffiency (use before diagnosis confirmed) Goals: Patient will maintain optimal edema control Date Initiated: 04/12/2021 Target Resolution Date: 05/04/2021 Goal  Status: Active Interventions: Assess peripheral edema status every visit. Compression as ordered Provide education on venous insufficiency Treatment Activities: Therapeutic compression applied : 04/12/2021 Amber, [name] ([id number]) Notes: Wound/Skin Impairment Nursing Diagnoses: Impaired tissue integrity Goals: Patient/caregiver will verbalize understanding of skin care regimen Date Initiated: 04/12/2021 Target Resolution Date: 05/04/2021 Goal Status: Active Ulcer/skin breakdown will have a volume reduction of 30% by week 4 Date Initiated: 04/12/2021 Target Resolution Date: 05/12/2021 Goal Status: Active Interventions: Assess ulceration(s) every visit Treatment Activities: Referred to DME Jamile Rekowski for dressing supplies : 04/12/2021 Skin care regimen initiated : 04/12/2021 Notes: Electronic Signature(s) Signed: 05/22/2021 1:36:15 PM By: [name], [name], RN, CWS, [name], [name] Entered By: [name], [name], RN, CWS, Kim on 05/03/2021 15:13:27 Heckel, Tenna [name] ([id number]) -------------------------------------------------------------------------------- Pain Assessment Details Patient Name: Amber, [name]. Date of Service: 05/03/2021 2:30 PM Medical Record Number: [id number] Patient Account Number: [id number] Date of Birth/Sex: 10-08-1946 (75 y.o. F) Treating RN: Donnamarie Poag Primary Care Ermine Stebbins: [name] Other Clinician: Referring Sherrell Farish: [name] Treating Jarvis Sawa/Extender: Yaakov Guthrie in Treatment: 3 Active Problems Location of Pain Severity and Description of Pain Patient Has Paino Yes Site Locations Pain Location: Generalized Pain, Pain in Ulcers  Rate the pain. Current Pain Level: 8 Pain Management and Medication Current Pain Management: Electronic Signature(s) Signed: 05/03/2021 3:28:20 PM By: Donnamarie Poag Entered By: Donnamarie Poag on 05/03/2021 14:58:25 [name], Tenna [name]  ([id number]) -------------------------------------------------------------------------------- Patient/Caregiver Education Details Patient Name: [name] Date of Service: 05/03/2021 2:30 PM Medical Record Number: [id number] Patient Account Number: [id number] Date of Birth/Gender: May 06, 1946 (75 y.o. F) Treating RN: Cornell Barman Primary Care Physician: [name] Other Clinician: Referring Physician: [name] Treating Physician/Extender: Yaakov Guthrie in Treatment: 3 Education Assessment Education Provided To: Patient Education Topics Provided Venous: Handouts: Controlling Swelling with Multilayered Compression Wraps Methods: Demonstration, Explain/Verbal Responses: State content correctly Welcome To [facility]: Handouts: Welcome To [facility] Methods: Demonstration, Explain/Verbal Responses: State content correctly Electronic Signature(s) Signed: 05/22/2021 1:36:15 PM By: [name], [name], RN, CWS, [name], [name] Entered By: [name], [name], RN, CWS, Kim on 05/03/2021 15:16:04 [name] ([id number]) -------------------------------------------------------------------------------- Wound Assessment Details Patient Name: Amber, [name]. Date of Service: 05/03/2021 2:30 PM Medical Record Number: [id number] Patient Account Number: [id number] Date of Birth/Sex: 19-Jul-1946 (75 y.o. F) Treating RN: Donnamarie Poag Primary Care Aviona Martenson: [name] Other Clinician: Referring Lethia Donlon: [name] Treating Daya Dutt/Extender: Yaakov Guthrie in Treatment: 3 Wound Status Wound Number: 13 Primary Venous Leg Ulcer Etiology: Wound Location: Left, Distal, Posterior Lower Leg Wound Open Wounding Event: Gradually Appeared Status: Date Acquired: 02/24/2021 Comorbid Cataracts, Asthma, Sleep Apnea, Deep Vein Thrombosis, Weeks Of Treatment: 3 History: Hypertension, Peripheral Venous Disease, Osteoarthritis, Clustered Wound: No Received  Chemotherapy, Received Radiation Photos Wound Measurements Length: (cm) 2 Width: (cm) 2.5 Depth: (cm) 0.1 Area: (cm) 3.927 Volume: (cm) 0.393 % Reduction in Area: 33.3% % Reduction in Volume: 33.3% Tunneling: No Undermining: No Wound Description Classification: Full Thickness Without Exposed Support Structu Exudate Amount: Large Exudate Type: Serosanguineous Exudate Color: red, brown res Foul Odor After Cleansing: No Slough/Fibrino Yes Wound Bed Granulation Amount: Small (1-33%) Exposed Structure Granulation Quality: Red, Hyper-granulation Fascia Exposed: No Necrotic Amount: Large (67-100%) Fat Layer (Subcutaneous Tissue) Exposed: Yes Necrotic Quality: Adherent Slough Tendon Exposed: No Muscle Exposed: No Joint Exposed: No Bone Exposed: No Electronic Signature(s) Signed: 05/03/2021 3:28:20 PM By: Donnamarie Poag Entered By: Donnamarie Poag on 05/03/2021 14:59:06 Maskell, Tenna [name] ([id number]) -------------------------------------------------------------------------------- [place] Details Patient Name: Amber, [name]. Date of Service: 05/03/2021 2:30 PM Medical Record Number: [id number] Patient Account Number: [id number] Date of Birth/Sex: 12-21-1945 (75 y.o. F) Treating RN: Donnamarie Poag Primary Care Annaliesa Blann: [name] Other Clinician: Referring Herb Beltre: [name] Treating Bernita Beckstrom/Extender: Yaakov Guthrie in Treatment: 3 Vital Signs Time Taken: 14:58 Temperature (F): 98.5 Height (in): 63 Pulse (bpm): 81 Weight (lbs): 212 Respiratory Rate (breaths/min): 20 Body Mass Index (BMI): 37.6 Blood Pressure (mmHg): 180/82 Reference Range: 80 - 120 mg / dl Electronic Signature(s) Signed: 05/03/2021 3:28:20 PM By: Donnamarie Poag Entered ByDonnamarie Poag on 05/03/2021 14:58:14

## 2021-05-22 NOTE — Progress Notes (Signed)
ERLA, BACCHI ([id number]) Visit Report for 05/03/2021 Chief Complaint Document Details Patient Name: [name], [name]. Date of Service: 05/03/2021 2:30 PM Medical Record Number: [id number] Patient Account Number: [id number] Date of Birth/Sex: 26-Mar-1946 (75 y.o. F) Treating RN: Cornell Barman Primary Care Provider: [name] Other Clinician: Referring Provider: [name] Treating Provider/Extender: Yaakov Guthrie in Treatment: 3 Information Obtained from: Patient Chief Complaint Left calf venous stasis ulceration. 11/13/16; the patient is here for a left calf recurrent ulceration which is painful and has been present for the last month 01/22/17; the patient re-presents here for recurrent difficulties with blistering and leaking fluid on her left posterior calf 12/10/18; the patient returns to clinic for a wound on the left medial calf 04/12/2021; left lower extremity wound Electronic Signature(s) Signed: 05/03/2021 3:26:16 PM By: Kalman Shan DO Entered By: Kalman Shan on 05/03/2021 15:18:52 [name], [name] ([id number]) -------------------------------------------------------------------------------- HPI Details Patient Name: [name], [name]. Date of Service: 05/03/2021 2:30 PM Medical Record Number: [id number] Patient Account Number: [id number] Date of Birth/Sex: December 14, 1945 (75 y.o. F) Treating RN: Cornell Barman Primary Care Provider: [name] Other Clinician: Referring Provider: [name] Treating Provider/Extender: Yaakov Guthrie in Treatment: 3 History of Present Illness HPI Description: Pleasant 75 year old with history of chronic venous insufficiency. No diabetes or peripheral vascular disease. Left ABI 1.29. Questionable history of left lower extremity DVT. She developed a recurrent ulceration on her left lateral calf in December 2015, which she attributes to poor diet and subsequent lower extremity edema. She underwent endovenous laser  ablation of her left greater saphenous vein in 2010. She underwent laser ablation of accessory branch of left GSV in April 2016 by Dr. Kellie Simmering at [facility]. She was previously wearing Unna boots, which she tolerated well. Tolerating 2 layer compression and cadexomer iodine. She returns to clinic for follow-up and is without new complaints. She denies any significant pain at this time. She reports persistent pain with pressure. No claudication or ischemic rest pain. No fever or chills. No drainage. READMISSION 11/13/16; this is a 75 year old woman who is not a diabetic. She is here for a review of a painful area on her left medial lower extremity. I note that she was seen here previously last year for wound I believe to be in the same area. At that time she had undergone previously a left greater saphenous vein ablation by Dr. Kellie Simmering and she had a ablation of the anterior accessory branch of the left greater saphenous vein in March 2016. Seeing that the wound actually closed over. In reviewing the history with her today the ulcer in this area has been recurrent. She describes a biopsy of this area in 2009 that only showed stasis physiology. 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 Do not see that she has ever had arterial studies in the left leg. She tells me she has a follow-up with Dr. Kellie Simmering in roughly 10 days In any case she developed the reopening of this area roughly a month ago. On the background of this she describes rapidly increasing edema which has responded to Lasix 40 mg and metolazone 2.5 mg as well as the patient's lymph massage. She has been told she has both venous insufficiency and lymphedema but she cannot tolerate compression stockings 11/28/16; the  patient saw Dr.  Kellie Simmering recently. Per the patient he did arterial Dopplers in the office that did not show evidence of arterial insufficiency, per the patient he stated "treat this like an ordinary venous ulcer". She also saw her dermatologist Dr. Ronnald Ramp who felt that this was more of a vascular ulcer. In general things are improving although she arrives today with increasing bilateral lower extremity edema with weeping a deeper fluid through the wound on the left medial leg compatible with some degree of lymphedema 12/04/16; the patient's wound is fully epithelialized but I don't think fully healed. We will do another week of depression with Promogran and TCA however I suspect we'll be able to discharge her next week. This is a very unusual-looking wound which was initially a figure-of-eight type wound lying on its side surrounded by petechial like hemorrhage. She has had venous ablation on this side. She apparently does not have an arterial issue per Dr. Kellie Simmering. She saw her dermatologist thought it was "vascular". Patient is definitely going to need ongoing compression and I talked about this with her today she will go to elastic therapy after she leaves here next week 12/11/16; the patient's wound is not completely closed today. She has surrounding scar tissue and in further discussion with the patient it would appear that she had ulcers in this area in 2009 for a prolonged period of time ultimately requiring a punch biopsy of this area that only showed venous insufficiency. I did not previously pickup on this part of the history from the patient. 12/18/16; the patient's wound is completely epithelialized. There is no open area here. She has significant bilateral venous insufficiency with secondary lymphedema to a mild-to-moderate degree she does not have compression stockings.. She did not say anything to me when I was in the room, she told our intake nurse that she was still having pain in this area. This isn't unusual  recurrent small open area. She is going to go to elastic therapy to obtain compression stockings. 12/25/16; the patient's wound is fully epithelialized. There is no open area here. The patient describes some continued episodic discomfort in this area medial left calf. However everything looks fine and healed here. She is been to elastic therapy and caught herself 15-20 mmHg stockings, they apparently were having trouble getting 20-30 mm stockings in her size 01/22/17; this is a patient we discharged from the clinic a month ago. She has a recurrent open wound on her medial left calf. She had 15 mm support stockings. I told her I thought she needed 20-30 mm compression stockings. She tells me that she has been ill with hospitalization secondary to asthma and is been found to have severe hypokalemia likely secondary to a combination of Lasix and metolazone. This morning she noted blistering and leaking fluid on the posterior part of her left leg. She called our intake nurse urgently and we was saw her this afternoon. She has not had any real discomfort here. I don't know that she's been wearing any stockings on this leg for at least 2-3 days. ABIs in this clinic were 1.21 on the right and 1.3 on the left. She is previously seen vascular surgery who does not think that there is a peripheral arterial issue. 01/30/17; Patient arrives with no open wound on the left leg. She has been to elastic therapy and obtained 20-60mmhg below knee stockings and she has one on the right leg today. READMISSION 02/19/18; this Arentz is a now 75 year old patient we've had  in this clinic perhaps 3 times before. I had last looked at her from January 07 December 2016 with an area on the medial left leg. We discharged her on 12/25/16 however she had to be readmitted on 01/22/17 with a recurrence. I have in my notes that we discharged her on 20-30 mm stockings although she tells me she was only wearing support hose because she cannot  get stockings on predominantly related to her cervical spine surgery/issues. She has had previous ablations done by vein and vascular in [place] including a great saphenous vein ablation on the left with an anterior accessory branch ablation I think both of these were in 2016. On one of the previous visit she had a biopsy noted 2009 that was negative. She is not felt to have an arterial issue. She is not a diabetic. She does have a history of obstructive sleep apnea hypertension asthma as well as chronic venous insufficiency and lymphedema. On this occasion she noted 2 dry scaly patch on her left leg. She tried to put lotion on this it didn't really help. There were 2 open areas.the patient has been seeing her primary physician from 02/05/18 through 02/14/18. She had Unna boots applied. The superior wound now on the lateral left leg has closed but she's had one wound that remains open on the lateral left leg. This is not the same spot as we dealt with in 2018. ABIs in this clinic were 1.3 bilaterally [name], [name] ([id number]) 02/26/18; patient has a small wound on the left lateral calf. Dimensions are down. She has chronic venous insufficiency and lymphedema. 03/05/18; small open area on the left lateral calf. Dimensions are down. Tightly adherent necrotic debris over the surface of the wound which was difficult to remove. Also the dressing [over collagen] stuck to the wound surface. This was removed with some difficulty as well. Change the primary dressing to Hydrofera Blue ready 03/12/18; small open area on the left lateral calf. Comes in with tightly adherent surface eschar as well as some adherent Hydrofera Blue. 03/19/18; open area on the left lateral calf. Again adherent surface eschar as well as some adherent Hydrofera Blue nonviable subcutaneous tissue. She complained of pain all week even with the reduction from 4-3 layer compression I put on last week. Also she had an increase in her ankle  and calf measurements probably related to the same thing. 03/26/18; open area on the left lateral calf. A very small open area remains here. We used silver alginate starting last week as the Hydrofera Blue seem to stick to the wound bed. In using 4-layer compression 04/02/18; the open area in the left lateral calf at some adherent slough which I removed there is no open area here. We are able to transition her into her own compression stocking. Truthfully I think this is probably his support hose. However this does not maintain skin integrity will be limited. She cannot put over the toe compression stockings on because of neck problems hand problems etc. She is allergic to the lining layer of juxta lites. We might be forced to use extremitease stocking should this fail READMIT 11/24/2018 Patient is now a 75 year old woman who is not a diabetic. She has been in this clinic on at least 3 previous occasions largely with recurrent wounds on her left leg secondary to chronic venous insufficiency with secondary lymphedema. Her situation is complicated by inability to get stockings on and an allergy to neoprene which is apparently a component and at least  juxta lites and other stockings. As a result she really has not been wearing any stockings on her legs. She tells [place] that roughly 2 or 3 weeks ago she started noticing a stinging sensation just above her ankle on the left medial aspect. She has been diagnosed with pseudogout and she wondered whether this was what she was experiencing. She tried to dress this with something she bought at the store however subsequently it pulled skin off and now she has an open wound that is not improving. She has been using Vaseline gauze with a cover bandage. She saw her primary doctor last week who put an Haematologist on her. ABIs in this clinic was 1.03 on the left 2/12; the area is on the left medial ankle. Odd-looking wound with what looks to be surface epithelialization  but a multitude of small petechial openings. This clearly not closed yet. We have been using silver alginate under 3 layer compression with TCA 2/19; the wound area did not look quite as good this week. Necrotic debris over the majority of the wound surface which required debridement. She continues to have a multitude of what looked to be small petechial openings. She reminds [place] that she had a biopsy on this initially during her first outbreak in 2015 in [place] dermatology. She expresses concern about this being a possible melanoma. 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 I assured her that this does not look anything like melanoma. Except for the petechial reaction it does look like a venous insufficiency area and she certainly has evidence of this on both sides 2/26; a difficult area on the left medial ankle. The patient clearly has chronic venous hypertension with some degree of lymphedema. The odd thing about the area is the small petechial hemorrhages. I am not really sure how to explain this. This was present last time and this is not a compression injury. We have been using Hydrofera Blue which I changed to last week 3/4; still using Hydrofera Blue. Aggressive debridement today. She does not have known arterial issues. She has seen Dr. Kellie Simmering at [facility] vein and vascular and and has an ablation on the left. [Anterior accessory branch of the greater saphenous]. From what I remember they did not feel she had an arterial issue. The patient has had this area biopsied in 2009 at [facility] dermatology and by her recollection they said this was "stasis". She is also follow-up with dermatology locally who thought that this was more of a vascular issue 3/11; using Hydrofera Blue. Aggressive debridement today. She does not have an arterial issue. We are using 3 layer compression although we may need to go  to 4. The patient has been in for multiple changes to her wrap since I last saw her a week ago. She says that the area was leaking. I do not have too much more information on what was found 01/19/19 on evaluation today patient was actually being seen for a nurse visit when unfortunately she had the area on her left lateral lower extremity as well as weeping from the right lower extremity that became apparent. Therefore we did end up actually seeing her for a full visit with myself. She is having some pain at this site as well but fortunately nothing too significant at this point. No fevers, chills, nausea, or vomiting noted at this time. 3/18-Patient is back to the clinic with the left leg venous  leg ulcer, the ulcer is larger in size, has a surface that is densely adherent with fibrinous tissue, the Hydrofera Blue was used but is densely adherent and there was difficulty in removing it. The right lower extremity was also wrapped for weeping edema. Patient has a new area over the left lateral foot above the malleolus that is small and appears to have no debris with intact surrounding skin. Patient is on increased dose of Lasix also as a means to edema management 3/25; the patient has a nonhealing venous ulcer on the medial left leg and last week developed a smaller area on the lateral left calf. We have been using Hydrofera Blue with a contact layer. 4/1; no major change in these wounds areas. Left medial and more recently left lateral calf. I tried Iodoflex last week to aid in debridement she did not tolerate this. She stated her pain was terrible all week. She took the top layer of the 4 layer compression off. 4/8; the patient actually looks somewhat better in terms of her more prominent left lateral calf wound. There is some healthy looking tissue here. She is still complaining of a lot of discomfort. 4/15; patient in a lot of pain secondary to sciatica. She is on a prednisone taper prescribed by  her primary physician. She has the 2 areas one on the left medial and more recently a smaller area on the left lateral calf. Both of these just above the malleoli 4/22; her back pain is better but she still states she is very uncomfortable and now feels she is intolerant to the [employer]. No real change in the wounds we have been using Sorbact. She has been previously intolerant to Iodoflex. There is not a lot of option about what we can use to debride this wound under compression that she no doubt needs. sHe states Ultram no longer works for her pain 4/29; no major change in the wounds slightly increased depth. Surface on the original medial wound perhaps somewhat improved however the more recent area on the lateral left ankle is 100% covered in very adherent debris we have been using Sorbact. She tolerates 4 layer compression well and her edema control is a lot better. She has not had to come in for a nurse check 5/6; no major change in the condition of the wounds. She did consent to debridement today which was done with some difficulty. Continuing Sorbact. She did not tolerate Iodoflex. She was in for a check of her compression the day after we wrapped her last week this was adjusted but nothing much was found 5/13; no major change in the condition or area of the wounds. I was able to get a fairly aggressive debridement done on the lateral left leg wound. Even using Sorbact under compression. She came back in on Friday to have the wrap changed. She says she felt uncomfortable on the lateral aspect of her ankle. She has a long history of chronic venous insufficiency including previous ablation surgery on this side. 5/20-Patient returns for wounds on left leg with both wounds covered in slough, with the lateral leg wound larger in size, she has been in 3 layer compression and felt more comfortable, she describes pain in ankle, in leg and pins and needles in foot, and is about to try Pamelor for  this 6/3; wounds on the left lateral and left medial leg. The area medially which is the most recent of the 2 seems to have had the largest increase in  [name], [name] ([id number]) dimensions. We have been using Sorbac to try and debride the surface. She has been to see orthopedics they apparently did a plain x-ray that was indeterminant. Diagnosed her with neuropathy and they have ordered an MRI to determine if there is underlying osteomyelitis. This was not high on my thought list but I suppose it is prudent. We have advised her to make an appointment with vein and vascular in [place]. She has a history of a left greater saphenous and accessory vein ablations I wonder if there is anything else that can be done from a surgical point of view to help in these difficult refractory wounds. We have previously healed this wound on one occasion but it keeps on reopening [medial side] 6/10; deep tissue culture I did last week I think on the left medial wound showed both moderate E. coli and moderate staph aureus [MSSA]. She is going to require antibiotics and I have chosen Augmentin. We have been using Sorbact and we have made better looking wound surface on both sides but certainly no improvement in wound area. She was back in last Friday apparently for a dressing changes the wrap was hurting her outer left ankle. She has not managed to get a hold of vein and vascular in [place]. We are going to have to make her that appointment 6/17; patient is tolerating the Augmentin. She had an MRI that I think was ordered by orthopedic surgeon this did not show osteomyelitis or an abscess did suggest cellulitis. We have been using Sorbact to the lateral and medial ankles. We have been trying to arrange a follow-up appointment with vein and vascular in [place] or did her original ablations. We apparently an area sent the request to vein and vascular in [facility] 6/24; patient has completed the Augmentin. We  do not yet have a vein and vascular appointment in [place]. I am not sure what the issue is here we have asked her to call tomorrow. We are using Sorbact. Making some improvements and especially the medial wound. Both surfaces however look better medial and lateral. 7/1; the patient has been in contact with vein and vascular in [place] but has not yet received an appointment. Using Sorbact we have gradually improve the wound surface with no improvement in surface area. She is approved for Apligraf but the wound surface still is not completely viable. She has not had to come in for a dressing change 7/8; the patient has an appointment with vein and vascular on 7/31 which is a Friday afternoon. She is concerned about getting back here for [place] to dress her wounds. I think it is important to have them goal for her venous reflux/history of ablations etc. to see if anything else can be done. She apparently tested positive for 1 of the blood tests with regards to lupus and saw a rheumatologist. He has raised the issue of vasculitis again. I have had this thought in the past however the evidence seems overwhelming that this is a venous reflux etiology. If the rheumatologist tells me there is clinical and laboratory investigation is positive for lupus I will rethink this. 7/15; the patient's wound surfaces are quite a bit better. The medial area which was her original wound now has no depth although the lateral wound which was the more recent area actually appears larger. Both with viable surfaces which is indeed better. Using Sorbact. I wanted to use Apligraf on her however there is the issue of the vein and vascular  appointment on 7/31 at 2:00 in the afternoon which would not allow her to get back to be rewrapped and they would no doubt remove the graft 7/22; the patient's wound surfaces have moderate amount of debris although generally look better. The lateral one is larger with 2 small  satellite areas superiorly. We are waiting for her vein and vascular appointment on 7/31. She has been approved for Apligraf which I would like to use after th 7/29; wound surfaces have improved no debridement is required we have been using Sorbact. She sees vein and vascular on Friday with this so question of whether anything can be done to lessen the likelihood of recurrence and/or speed the healing of these areas. She is already had previous ablations. She no doubt has severe venous hypertension 8/5-Patient returns at 1 week, she was in [place] for 3 days by her podiatrist, we have been using so backed to the wound, she has increased pain in both the wounds on the left lower leg especially the more distal one on the lateral aspect 8/12-Patient returns at 1 week and she is agreeable to having debridement in both wounds on her left leg today. We have been using Sorbact, and vascular studies were reviewed at last visit 8/19; the patient arrives with her wounds fairly clean and no debridement is required. We have used Sorbact which is really done a nice job in cleaning up these very difficult wound surfaces. The patient saw Dr. Donzetta Matters of vascular surgery on 7/31. He did not feel that there was an arterial component. He felt that her treated greater saphenous vein is adequately addressed and that the small saphenous vein did not appear to be involved significantly. She was also noted to have deep venous reflux which is not treatable. Dr. Donzetta Matters mentioned the possibility of a central obstructive component leading to reflux and he offered her central venography. She wanted to discuss this or think about it. I have urged her to go ahead with this. She has had recurrent difficult wounds in these areas which do heal but after months in the clinic. If there is anything that can be done to reduce the likelihood of this I think it is worth it. 9/2 she is still working towards getting follow-up with Dr. Donzetta Matters to  schedule her CT. Things are quite a bit worse venography. I put Apligraf on 2 weeks ago on both wounds on the medial and lateral part of her left lower leg. She arrives in clinic today with 3 superficial additional wounds above the area laterally and one below the wound medially. She describes a lot of discomfort. I think these are probably wrapped injuries. Does not look like she has cellulitis. 07/20/2019 on evaluation today patient appears to be doing somewhat poorly in regard to her lower extremity ulcers. She in fact showed signs of erythema in fact we may even be dealing with an infection at this time. Unfortunately I am unsure if this is just infection or if indeed there may be some allergic reaction that occurred as a result of the Apligraf application. With that being said that would be unusual but nonetheless not impossible in this patient is one who is unfortunately allergic to quite a bit. Currently we have been using the Sorbact which seems to do as well as anything for her. I do think we may want to obtain a culture today to see if there is anything showing up there that may need to be addressed. 9/16; noted that  last week the wounds look worse in 1 week follow-up of the Apligraf. Using Sorbact as of 2 days ago. She arrives with copious amounts of drainage and new skin breakdown on the back of the left calf. The wounds arm more substantial bilaterally. There is a fair amount of swelling in the left calf no overt DVT there is edema present I think in the left greater than right thigh. She is supposed to go on 9/28 for CT venography. The wounds on the medial and lateral calf are worse and she has new skin breakdown posteriorly at least new for me. This is almost developing into a circumferential wound area The Apligraf was taken off last week which I agree with things are not going in the right direction a culture was done we do not have that back yet. She is on Augmentin that she started 2  days ago 9/23; dressing was changed by her nurses on Monday. In general there is no improvement in the wound areas although the area looks less angry than last week. She did get Augmentin for MSSA cultured on the 14th. She still appears to have too much swelling in the left leg even with 3 layer compression 9/30; the patient underwent her procedure on 9/28 by Dr. Donzetta Matters at [facility]. She was discovered to have the common iliac vein measuring 12.2 mm but at the level of L4-L5 measured 3 mm. After stenting it measured 10 mm. 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 Rouleaux flow in the common femoral and femoral vein was observed much improved after stenting. We are using silver alginate to the wounds on the medial and lateral ankle on the left. 4 layer compression 10/7; the patient had fluid swelling around her knee and 4 layer compression. At the advice of vein and vascular this was reduced to 3 layer which she is tolerating better. We have been using silver alginate under 3 layer compression since last Friday 10/14; arrives with the areas on the left ankle looking a lot better. Inflammation in the area also a lot better. She came in for a nurse check on 10/9 10/21; continued nice improvement. Slight improvements in surface area of both the medial and lateral wounds on the left. A lot of the satellite lesions in the weeping erythema around these from stasis dermatitis is resolved. We have been using silver alginate TYKISHA, AREOLA ([id number]) 10/28; general improvement in the entire wound areas although not a lot of change in dimensions the wound certainly looks better. There is a lot less in terms of venous inflammation. Continue silver alginate this week however look towards Hydrofera Blue next week 11/4; very adherent debris on the medial wound left wound is not as bad. We have been using silver alginate. Change to [facility] today 11/11; very adherent  debris on both wound areas. She went to vein and vascular last week and follow-up they put in [place] boot on this today. He says the [facility] was adherent. Wound is definitely not as good as last week. Especially on the left there the satellite lesions look more prominent 11/18; absolutely no better. erythema on lateral aspect with tenderness. 09/30/2019 on evaluation today patient appears to actually be doing better. Dr. Dellia Nims did put her on doxycycline last week which I do believe has helped her at this point. Fortunately there is no signs of active infection at this time. No fevers, chills, nausea, vomiting, or diarrhea. I do believe he  may want extend the doxycycline for 7 additional days just to ensure everything does completely cleared up the patient is in agreement with that plan. Otherwise she is going require some sharp debridement today 12/2; patient is completing a 2-week course of doxycycline. I gave her this empirically for inflammation as well as infection when I last saw her 2 weeks ago. All of this seems to be better. She is using silver alginate she has the area on the medial aspect of the larger area laterally and the 2 small satellite regions laterally above the major wound. 12/9; the patient's wound on the left medial and left lateral calf look really quite good. We have been using silver alginate. She saw vein and vascular in follow-up on 10/09/2019. She has had a previous left greater saphenous vein ablation by Dr. Oscar La in 2016. More recently she underwent a left common iliac vein stent by Dr. 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 The swelling is improved and certainly the wounds have improved. The patient shows [place] today area on the right medial calf there is almost no wound but leaking lymphedema. She says she start this started 3 or 4 days ago. She did not traumatize it. It is not painful. She does not wear compression on that side 12/16; the patient  continues to do well laterally. Medially still requiring debridement. The area on the right calf did not materialize to anything and is not currently open. We wrapped this last time. She has support stockings for that leg although I am not sure they are going to provide adequate compression 12/23; the lateral wound looks stable. Medially still requiring debridement for tightly adherent fibrinous debris. We've been using silver alginate. Surface area not any different 12/30; neither wound is any better with regards to surface and the area on the left lateral is larger. I been using silver alginate to the left lateral which look quite good last week and Sorbact to the left medial 11/11/2019. Lateral wound area actually looks better and somewhat smaller. Medial still requires a very aggressive debridement today. We have been using Sorbact on both wound areas 1/13; not much better still adherent debris bilaterally. I been using Sorbact. She has severe venous hypertension. Probably some degree of dermal fibrosis distally. I wonder whether tighter compression might help and I am going to try that today. We also need to work on the bioburden 1/20; using Sorbact. She has severe venous hypertension status post stent placement for pelvic vein compression. We applied gentamicin last time to see if we could reduce bioburden I had some discussion with her today about the use of pentoxifylline. This is occasionally used in this setting for wounds with refractory venous insufficiency. However this interacts with Plavix. She tells me that she was put on this after stent placement for 3 months. She will call Dr. Claretha Cooper office to discuss 1/27; we are using gentamicin under Sorbact. 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 She has a stent. Wound medially is measuring smaller this week. Laterally measuring slightly larger although she has some satellite lesions superiorly 2/3; gentamicin  under Sorbact under 4-layer compression. 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 She has a stent on Plavix. Her wounds are measuring smaller this week. More substantially laterally where there is a satellite lesion superiorly. 2/10; gentamicin under Sorbac. 4-layer compression. Patient communicated with Dr. Donzetta Matters at vein and vascular in [place]. He is okay with the patient coming off Plavix I  will therefore start her on pentoxifylline for a 1 month trial. In general her wounds look better today. I had some concerns about swelling in the left thigh however she measures 61.5 on the right and 63 on the mid thigh which does not suggest there is any difficulty. The patient is not describing any pain. 2/17; gentamicin under Sorbac 4-layer compression. She has been on pentoxifylline for 1 week and complains of loose stool. No nausea she is eating and drinking well 2/24; the patient apparently came in 2 days ago for a nurse visit when her wrap fell down. Both areas look a little worse this week macerated medially and satellite lesions laterally. Change to silver alginate today 3/3; wounds are larger today especially medially. She also has more swelling in her foot lower leg and I even noted some swelling in her posterior thigh which is tender. I wonder about the patency of her stent. Fortuitously she sees Dr. Claretha Cooper group on Friday 3/10; Mrs. Steward was seen by vein and vascular on 3/5. The patient underwent ultrasound. There was no evidence of thrombosis involving the IVC no evidence of thrombosis involving the right common iliac vein there is no evidence of thrombosis involving the right external iliac vein the left external vein is also patent. The right common iliac vein stent appears patent bilateral common femoral veins are compressible and appear patent. I was concerned about the left common iliac stent however it looks like this is functional. She has some edema in the  posterior thigh that was tender she still has that this week. I also note they had trouble finding the pulses in her left foot and booked her for an ABI baseline in 4 weeks. She will follow up in 6 months for repeat IVC duplex. The patient stopped the pentoxifylline because of diarrhea. It does not look like that was being effective in any case. I have advised her to go back on her aspirin 81 mg tablet, vascular it also suggested this 3/17; comes in today with her wound surfaces a lot better. The excoriations from last week considerably better probably secondary to the TCA. We have been using silver alginate 3/24; comes in today with smaller wounds both medially and laterally. Both required debridement. There are 2 small satellite areas superiorly laterally. She also has a very odd bandlike area in the mid calf almost looking like there was a weakness in the wrap in a localized area. I would write this off as being this however anteriorly she has a small raised ballotable area that is very tender almost reminiscent of an abscess but there was no obvious purulent surface to it. 02/04/20 upon evaluation today patient appears to be doing fairly well in regard to her wounds today. Fortunately there is no signs of active infection at this time. No fevers, chills, nausea, vomiting, or diarrhea. She has been tolerating the dressing changes without complication. Fortunately I feel like she is showing signs of improvement although has been sometime since have seen her. Nonetheless the area of concern that Dr. Dellia Nims had last week where she had possibly an area of the wrap that was we can allow the leg to bulge appears to be doing significantly better today there is no signs of anything worsening. EARLEAN, FIDALGO ([id number]) 4/7; the patient's wounds on her medial and lateral left leg continue to contract. We have been using a regular alginate. Last week she developed an area on the right medial lower leg  which is probably  a venous ulcer as well. 4/14; the wounds on her left medial and lateral lower leg continue to contract. Surface eschar. We have been using regular alginate. The area on the right medial lower leg is closed. We have been putting both legs under 4-layer contraction. The patient went back to see vein and vascular she had arterial studies done which were apparently "quite good" per the patient although I have not read their notes I have never felt she had an arterial issue. 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 This is been longstanding and refractory to exercise, leg elevation and longstanding use of compression wraps in our clinic as well as compression stockings on the times we have been able to get these to heal 4/21; we thought she actually might be close this week however she arrives in clinic with a lot of edema in her upper left calf and into her posterior thigh. This is been an intermittent problem here. She says the wrap fell down but it was replaced with a nurse visit on Monday. We are using calcium alginate to the wounds and the wound sizes there not terribly larger than last week but there is a lot more edema 4/28; again wound edges are smaller on both sides. Her edema is better controlled than last time. She is obtained her compression pumps from [employer] although they have not been to her home to set these up. 5/5; left medial and left lateral both look stable. I am not sure the medial is any smaller. We have been using calcium alginate under 4-layer compression. oShe had an area on the right medial. This was eschared today. We have been wrapping this as well. She does not tolerate external compression stockings due to a history of various contact allergies. She has her compression pumps however the representative from the company is coming on her to show her how to use these tomorrow 5/19; patient with severe  chronic venous insufficiency secondary to central venous disease. She had a stent placed in her left common iliac vein. She has done better since but still difficult to control wounds. She comes in today with nothing open on the right leg. Her areas on the left medial and left lateral are just about closed. We are using calcium alginate under 4-layer compression. She is using her external compression pumps at home She only has 15-20 support stockings. States she cannot get anything tighter than that on. 03/30/20-Patient returns at 1 week, the wounds on the left leg are both slightly bigger, the last week she was on 3 layer compression which started to slide down. She is starting to use her lymphedema pumps although she stated on 1 day her right ankle started to swell up and she have to stop that day. Unfortunately the open area seem to oscillate between improving to the point of healing and then flaring up all to do with effectiveness of compression or lack of due to the left leg topography not keeping the compression wraps from rolling down 6/2 patient comes in with a 15/20 mmHg stocking on the right leg. She tells me that she developed a lot of swelling in her ankles she saw orthopedics she was felt to possibly be having a flare of pseudogout versus some other type of arthritis. She was put on steroids for a respiratory issue so that helps with the inflammation. She has not been using the pumps all week. She thinks the left thigh is more swollen  than usual and I would agree with that. She has an appointment with Dr. Donzetta Matters 9 days or so from now 6/9; both wounds on the left medial and left lateral are smaller. We have been using calcium alginate under compression. She does not have an open wound on the right leg she is using a stocking and her compression pumps things are going well. She has an appointment with Dr. Donzetta Matters with regards to her stent in the left common iliac vein 6/16; the wounds on the  left medial and left lateral ankle continues to contract. The patient saw Dr. Donzetta Matters and I think he seems satisfied. Ordered follow-up venous reflux studies on both sides in September. Cautioned that she may need thigh-high stockings. She has been using calcium alginate under compression on the left and her own stocking on the right leg. She tells [place] there are no open wounds on the right 6/23; left lateral is just about closed. Medial required debridement today. We have been using calcium alginate. Extensive discussion about the compression pumps she is only using these on 25 mmHg states she could not take 40 or 30 when the wrap came out to her home to demonstrate these. He said they should not feel tight 6/30; the left lateral wound has a slight amount of eschar. . The area medially is about the same using Hydrofera Blue. 7/7; left lateral wound still has some eschar. I will remove this next week may be closed. The area medially is very small using Hydrofera Blue with improvement. Unfortunately the stockings fell down. Unfortunately the blisters have developed at the edge of where the wrap fell. When this happened she says her legs hurt she did not use her pumps. We are not open Monday for her to come in and change the wraps and she had an appointment yesterday. She also tells me that she is going to have an MRI of her back. She is having pain radiating into her left anterior leg she thinks her from an L5 disc. She saw Dr. Ellene Route of neurosurgery 7/14; the area on the left lateral ankle area is closed. Still a small area medially however it looks better as well. We have been using Hydrofera Blue under 4-layer compression 7/21; left lateral ankle is still closed however her wound on the medial left calf is actually larger. This is probably because Hydrofera Blue got stuck to the wound. She came in for a nurse change on Friday and will do that again this week I was concerned about the amount of swelling  that she had last week however she is using her compression pumps twice a day and the swelling seems well controlled 7/28; remaining wound on the left medial lower leg is smaller. We have been using moistened silver collagen under compression she is coming back for a nurse visit. For reasons that were not really clear she was just keeping her legs elevated and not using her compression pumps. I have asked her to use the compression pumps. She does not have any wounds on the right leg 06/15/20-Patient returns at 2 weeks, her LLE edema is worse and she developed a blister wound that is new and has bigger posterior calf wound on right, we are using Prisma with pad, 4 layer compression. she has been on lasix 40 mg daily 8/18; patient arrives today with things a lot worse than I remember from a few weeks ago. She was seen last week. Noted that her edema was worse and that  she now had a left lateral wound as well as deteriorating edema in the medial and posterior part of the lower leg. She says she is using his or her external compression pumps once a day although I wonder about the compliance. 8/25; weeping area on the right medial lower leg. This had actually gotten a small localized area of her compression stocking wet. oOn the left side there is a large denuded area on the posterior medial lower leg and smaller area on the lateral. This was not the original areas that we dealt with. 9/1 the patient's wound on the left leg include the left lateral and left posterior. Larger superficial wounds weeping. She has very poor edema control. Tender localized edema in the left lower medial ankle/heel probably because of localized wrap issues. She freely admits she is not using the compression pumps. She has been up on her feet a lot. She thinks the hydrofera blue is contributing to the pain she is experiencing.. This is a complaint that I have occasionally heard 9/8; really not much improvement. The patient is  still complaining of a lot of pain particularly when she uses compression pumps. I switched her to silver alginate last time because she found the Hydrofera Blue to be irritating. I don't hear much difference in her description with the silver alginate. 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 She has follow-up with Dr. Donzetta Matters I think for ultrasound next month ARIELIS, LEONHART ([id number]) 9/15; quite a bit of improvement today. We have less edema and more epithelialization in both of her wound areas on the left medial and left lateral calf. These are not the site of her original wounds in this area. She says she has been using her compression pumps for 30 minutes twice a day, there pain issues that never quite understood. Silver alginate as the primary dressing 9/22; continued improvement. Both areas medially and laterally still have a small open area there is some eschar. She continues to complain of left medial ankle pain. Swelling in the leg is in much better condition. We have been using silver alginate 9/29; continued improvement. Both areas medially and laterally in the left calf look as though they are close some minor surface eschar but I think this is epithelialized. She comes in today saying she has a ruptured disc at L4-L5 cannot bend over to put on her stockings. 10/6; patient comes in today with no open wounds on either leg. However her edema on the left leg in the upper one third of the lower leg is poorly controlled nonpitting. She says that she could not use the pumps for 2 days and then she has been using the last couple of days. It is not clear to me she has been able to get her stocking on. She has back problems. Mrs. Marquart has severe chronic venous insufficiency with secondary lymphedema. Her venous insufficiency is partially centrally mediated and that she is now post stent in the left common iliac  vein. [facility] Thurner's syndrome/physiology]. She follows up with them on 10/15. She wears 20/30 below-knee stockings. She is supposed to use compression pumps at home although I think her compliance about with this is been less than 100%. I have asked her to use these 3 times a day. Finally I think she has lipodermatosclerosis in the left lower leg with an inverted bottle sign. It is been a major problem  controlling the edema in the left leg. The right leg we have had wounds on but not as significant a problem is on the left READMISSION 04/12/2021 Mrs. Sweeny is a 75 year old woman we know well in this clinic. She has severe chronic venous insufficiency. 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 I believe she has had bilateral greater saphenous vein ablation in the past as well. She tells me that this wound opened sometime in March. She had a fall and thinks it was initially abrasion. She developed areas she describes as little blisters on the anterior part of her leg and she saw dermatology and was treated for methicillin staph aureus with several rounds of antibiotics. She has been using support stockings on the left leg and says this is the only thing she can get on. Her compression pump use maybe once a day she says if she did not use one she use the other. She comes in today with incredible swelling in the left leg with a wound on the left posterior calf. She has been using Neosporin to this previously a hydrocolloid. 6/15; patient arrives back for 1 week follow-up.Marland Kitchen Apparently her wrap fell down she did not call us to replace this. He has poor edema control. She only uses her compression pumps once a day 6/29; patient presents for 1 week follow-up. She has tolerated the compression wrap well. We have been using Iodoflex under the wrap. She has no issues or complaints today. Electronic Signature(s) Signed: 05/03/2021 3:26:16 PM By: Kalman Shan  DO Entered By: Kalman Shan on 05/03/2021 15:19:57 Thelen, [name] ([id number]) -------------------------------------------------------------------------------- Physical Exam Details Patient Name: [name], [name]. Date of Service: 05/03/2021 2:30 PM Medical Record Number: [id number] Patient Account Number: [id number] Date of Birth/Sex: July 06, 1946 (75 y.o. F) Treating RN: Cornell Barman Primary Care Provider: [name] Other Clinician: Referring Provider: [name] Treating Provider/Extender: Yaakov Guthrie in Treatment: 3 Constitutional . Cardiovascular . Psychiatric . Notes Left lower extremity: To the posterior aspect there is an open wound with nonviable tissue present and surrounding granulation tissue circumferentially. No signs of infection Electronic Signature(s) Signed: 05/03/2021 3:26:16 PM By: Kalman Shan DO Entered By: Kalman Shan on 05/03/2021 15:21:07 [name] ([id number]) -------------------------------------------------------------------------------- Physician Orders Details Patient Name: [name], [name]. Date of Service: 05/03/2021 2:30 PM Medical Record Number: [id number] Patient Account Number: [id number] Date of Birth/Sex: [birth year] (75 y.o. F) Treating RN: Cornell Barman Primary Care Provider: [name] Other Clinician: Referring Provider: [name] Treating Provider/Extender: Yaakov Guthrie in Treatment: 3 Verbal / Phone Orders: No Diagnosis Coding ICD-10 Coding Code Description [phone] Chronic venous hypertension (idiopathic) with ulcer and inflammation of left lower extremity I89.0 Lymphedema, not elsewhere classified L97.221 Non-pressure chronic ulcer of left calf limited to breakdown of skin Follow-up Appointments Wound #13 Left,Distal,Posterior Lower Leg o Return Appointment in 1 week. o Nurse Visit as needed Bathing/ Shower/ Hygiene o May shower with wound dressing protected with  water repellent cover or cast protector. Anesthetic (Use 'Patient Medications' Section for Anesthetic Order Entry) o Lidocaine applied to wound bed Edema Control - Lymphedema / Segmental Compressive Device / Other o Elevate, Exercise Daily and Avoid Standing for Long Periods of Time. o Elevate legs to the level of the heart and pump ankles as often as possible o Elevate leg(s) parallel to the floor when sitting. o Compression Pump: Use compression pump on left lower extremity for 60 minutes, twice daily. - TWICE DAILY for  1 hour o Compression Pump: Use compression pump on right lower extremity for 60 minutes, twice daily. - TWICE DAILY for 1 hour Wound Treatment Wound #13 - Lower Leg Wound Laterality: Left, Posterior, Distal Cleanser: Soap and Water Discharge Instructions: Gently cleanse wound with antibacterial soap, rinse and pat dry prior to dressing wounds Primary Dressing: Iodosorb 40 (g) Discharge Instructions: Apply IodoSorb to wound bed only as directed. Secondary Dressing: ABD Pad 5x9 (in/in) Discharge Instructions: Cover with ABD pad Compression Wrap: Profore Lite LF 3 Multilayer Compression Bandaging System Discharge Instructions: Apply 3 multi-layer wrap as prescribed. Electronic Signature(s) Signed: 05/03/2021 3:26:16 PM By: Kalman Shan DO Signed: 05/22/2021 1:36:15 PM By: [name], [name], RN, CWS, [name], [name] Entered By: [name], [name], RN, CWS, Kim on 05/03/2021 15:14:55 [name], [name] ([id number]) -------------------------------------------------------------------------------- Problem List Details Patient Name: CHAKARA, BOGNAR. Date of Service: 05/03/2021 2:30 PM Medical Record Number: [id number] Patient Account Number: [id number] Date of Birth/Sex: [birth year] (75 y.o. F) Treating RN: Cornell Barman Primary Care Provider: [name] Other Clinician: Referring Provider: [name] Treating Provider/Extender: Yaakov Guthrie in Treatment: 3 Active  Problems ICD-10 Encounter Code Description Active Date MDM Diagnosis I87.332 Chronic venous hypertension (idiopathic) with ulcer and inflammation of 04/12/2021 No Yes left lower extremity I89.0 Lymphedema, not elsewhere classified 04/12/2021 No Yes L97.221 Non-pressure chronic ulcer of left calf limited to breakdown of skin 04/12/2021 No Yes Inactive Problems Resolved Problems Electronic Signature(s) Signed: 05/03/2021 3:26:16 PM By: Kalman Shan DO Entered By: Kalman Shan on 05/03/2021 15:17:11 Mesler, [name] ([id number]) -------------------------------------------------------------------------------- Progress Note/History and Physical Details Patient Name: [name], [name]. Date of Service: 05/03/2021 2:30 PM Medical Record Number: [id number] Patient Account Number: [id number] Date of Birth/Sex: [birth year] (75 y.o. F) Treating RN: Cornell Barman Primary Care Provider: [name] Other Clinician: Referring Provider: [name] Treating Provider/Extender: Yaakov Guthrie in Treatment: 3 Subjective Chief Complaint Information obtained from Patient Left calf venous stasis ulceration. 11/13/16; the patient is here for a left calf recurrent ulceration which is painful and has been present for the last month 01/22/17; the patient re-presents here for recurrent difficulties with blistering and leaking fluid on her left posterior calf 12/10/18; the patient returns to clinic for a wound on the left medial calf 04/12/2021; left lower extremity wound History of Present Illness (HPI) Pleasant 75 year old with history of chronic venous insufficiency. No diabetes or peripheral vascular disease. Left ABI 1.29. Questionable history of left lower extremity DVT. She developed a recurrent ulceration on her left lateral calf in December 2015, which she attributes to poor diet and subsequent lower extremity edema. She underwent endovenous laser ablation of her left greater saphenous vein in  2010. She underwent laser ablation of accessory branch of left GSV in April 2016 by Dr. Kellie Simmering at [facility]. She was previously wearing Unna boots, which she tolerated well. Tolerating 2 layer compression and cadexomer iodine. She returns to clinic for follow-up and is without new complaints. She denies any significant pain at this time. She reports persistent pain with pressure. No claudication or ischemic rest pain. No fever or chills. No drainage. READMISSION 11/13/16; this is a 75 year old woman who is not a diabetic. She is here for a review of a painful area on her left medial lower extremity. I note that she was seen here previously last year for wound I believe to be in the same area. At that time she had undergone previously a left greater saphenous vein ablation by Dr. Kellie Simmering and she had a ablation of  the anterior accessory branch of the left greater saphenous vein in March 2016. Seeing that the wound actually closed over. In reviewing the history with her today the ulcer in this area has been recurrent. She describes a biopsy of this area in 2009 that only showed stasis physiology. 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 Do not see that she has ever had arterial studies in the left leg. She tells me she has a follow-up with Dr. Kellie Simmering in roughly 10 days In any case she developed the reopening of this area roughly a month ago. On the background of this she describes rapidly increasing edema which has responded to Lasix 40 mg and metolazone 2.5 mg as well as the patient's lymph massage. She has been told she has both venous insufficiency and lymphedema but she cannot tolerate compression stockings 11/28/16; the patient saw Dr. Kellie Simmering recently. Per the patient he did  arterial Dopplers in the office that did not show evidence of arterial insufficiency, per the patient he stated "treat this like an ordinary venous ulcer". She also saw her dermatologist Dr. Ronnald Ramp who felt that this was more of a vascular ulcer. In general things are improving although she arrives today with increasing bilateral lower extremity edema with weeping a deeper fluid through the wound on the left medial leg compatible with some degree of lymphedema 12/04/16; the patient's wound is fully epithelialized but I don't think fully healed. We will do another week of depression with Promogran and TCA however I suspect we'll be able to discharge her next week. This is a very unusual-looking wound which was initially a figure-of-eight type wound lying on its side surrounded by petechial like hemorrhage. She has had venous ablation on this side. She apparently does not have an arterial issue per Dr. Kellie Simmering. She saw her dermatologist thought it was "vascular". Patient is definitely going to need ongoing compression and I talked about this with her today she will go to elastic therapy after she leaves here next week 12/11/16; the patient's wound is not completely closed today. She has surrounding scar tissue and in further discussion with the patient it would appear that she had ulcers in this area in 2009 for a prolonged period of time ultimately requiring a punch biopsy of this area that only showed venous insufficiency. I did not previously pickup on this part of the history from the patient. 12/18/16; the patient's wound is completely epithelialized. There is no open area here. She has significant bilateral venous insufficiency with secondary lymphedema to a mild-to-moderate degree she does not have compression stockings.. She did not say anything to me when I was in the room, she told our intake nurse that she was still having pain in this area. This isn't unusual recurrent small open area. She is going  to go to elastic therapy to obtain compression stockings. 12/25/16; the patient's wound is fully epithelialized. There is no open area here. The patient describes some continued episodic discomfort in this area medial left calf. However everything looks fine and healed here. She is been to elastic therapy and caught herself 15-20 mmHg stockings, they apparently were having trouble getting 20-30 mm stockings in her size 01/22/17; this is a patient we discharged from the clinic a month ago. She  has a recurrent open wound on her medial left calf. She had 15 mm support stockings. I told her I thought she needed 20-30 mm compression stockings. She tells me that she has been ill with hospitalization secondary to asthma and is been found to have severe hypokalemia likely secondary to a combination of Lasix and metolazone. This morning she noted blistering and leaking fluid on the posterior part of her left leg. She called our intake nurse urgently and we was saw her this afternoon. She has not had any real discomfort here. I don't know that she's been wearing any stockings on this leg for at least 2-3 days. ABIs in this clinic were 1.21 on the right and 1.3 on the left. She is previously seen vascular surgery who does not think that there is a peripheral arterial issue. 01/30/17; Patient arrives with no open wound on the left leg. She has been to elastic therapy and obtained 20-9mmhg below knee stockings and she has one on the right leg today. READMISSION 02/19/18; this Conteh is a now 75 year old patient we've had in this clinic perhaps 3 times before. I had last looked at her from January 07 December 2016 with an area on the medial left leg. We discharged her on 12/25/16 however she had to be readmitted on 01/22/17 with a recurrence. I have in my notes that we discharged her on 20-30 mm stockings although she tells me she was only wearing support hose because she cannot get stockings on predominantly related  to her cervical spine surgery/issues. She has had previous ablations done by vein and vascular in [place] including a great saphenous vein ablation on the left with an anterior accessory branch ablation I think both of these were in 2016. On one of the previous visit she had a biopsy noted 2009 that was negative. She is not felt to have an arterial issue. She is not a diabetic. She does have a [name], HOFLAND. ([id number]) history of obstructive sleep apnea hypertension asthma as well as chronic venous insufficiency and lymphedema. On this occasion she noted 2 dry scaly patch on her left leg. She tried to put lotion on this it didn't really help. There were 2 open areas.the patient has been seeing her primary physician from 02/05/18 through 02/14/18. She had Unna boots applied. The superior wound now on the lateral left leg has closed but she's had one wound that remains open on the lateral left leg. This is not the same spot as we dealt with in 2018. ABIs in this clinic were 1.3 bilaterally 02/26/18; patient has a small wound on the left lateral calf. Dimensions are down. She has chronic venous insufficiency and lymphedema. 03/05/18; small open area on the left lateral calf. Dimensions are down. Tightly adherent necrotic debris over the surface of the wound which was difficult to remove. Also the dressing [over collagen] stuck to the wound surface. This was removed with some difficulty as well. Change the primary dressing to Hydrofera Blue ready 03/12/18; small open area on the left lateral calf. Comes in with tightly adherent surface eschar as well as some adherent Hydrofera Blue. 03/19/18; open area on the left lateral calf. Again adherent surface eschar as well as some adherent Hydrofera Blue nonviable subcutaneous tissue. She complained of pain all week even with the reduction from 4-3 layer compression I put on last week. Also she had an increase in her ankle and calf measurements probably related to  the same thing. 03/26/18; open area on the  left lateral calf. A very small open area remains here. We used silver alginate starting last week as the Hydrofera Blue seem to stick to the wound bed. In using 4-layer compression 04/02/18; the open area in the left lateral calf at some adherent slough which I removed there is no open area here. We are able to transition her into her own compression stocking. Truthfully I think this is probably his support hose. However this does not maintain skin integrity will be limited. She cannot put over the toe compression stockings on because of neck problems hand problems etc. She is allergic to the lining layer of juxta lites. We might be forced to use extremitease stocking should this fail READMIT 11/24/2018 Patient is now a 75 year old woman who is not a diabetic. She has been in this clinic on at least 3 previous occasions largely with recurrent wounds on her left leg secondary to chronic venous insufficiency with secondary lymphedema. Her situation is complicated by inability to get stockings on and an allergy to neoprene which is apparently a component and at least juxta lites and other stockings. As a result she really has not been wearing any stockings on her legs. She tells [place] that roughly 2 or 3 weeks ago she started noticing a stinging sensation just above her ankle on the left medial aspect. She has been diagnosed with pseudogout and she wondered whether this was what she was experiencing. She tried to dress this with something she bought at the store however subsequently it pulled skin off and now she has an open wound that is not improving. She has been using Vaseline gauze with a cover bandage. She saw her primary doctor last week who put an Haematologist on her. ABIs in this clinic was 1.03 on the left 2/12; the area is on the left medial ankle. Odd-looking wound with what looks to be surface epithelialization but a multitude of small  petechial openings. This clearly not closed yet. We have been using silver alginate under 3 layer compression with TCA 2/19; the wound area did not look quite as good this week. Necrotic debris over the majority of the wound surface which required debridement. She continues to have a multitude of what looked to be small petechial openings. She reminds [place] that she had a biopsy on this initially during her first outbreak in 2015 in [place] [facility]. She expresses concern about this being a possible melanoma. 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 I assured her that this does not look anything like melanoma. Except for the petechial reaction it does look like a venous insufficiency area and she certainly has evidence of this on both sides 2/26; a difficult area on the left medial ankle. The patient clearly has chronic venous hypertension with some degree of lymphedema. The odd thing about the area is the small petechial hemorrhages. I am not really sure how to explain this. This was present last time and this is not a compression injury. We have been using Hydrofera Blue which I changed to last week 3/4; still using Hydrofera Blue. Aggressive debridement today. She does not have known arterial issues. She has seen Dr. Kellie Simmering at [facility] vein and vascular and and has an ablation on the left. [Anterior accessory branch of the greater saphenous]. From what I remember they did not feel she had an arterial issue. The patient has had this area biopsied in 2009 at  [place] [facility] and by her recollection they said this was "stasis". She is also follow-up with dermatology locally who thought that this was more of a vascular issue 3/11; using Hydrofera Blue. Aggressive debridement today. She does not have an arterial issue. We are using 3 layer compression although we may need to go to 4. The patient has been  in for multiple changes to her wrap since I last saw her a week ago. She says that the area was leaking. I do not have too much more information on what was found 01/19/19 on evaluation today patient was actually being seen for a nurse visit when unfortunately she had the area on her left lateral lower extremity as well as weeping from the right lower extremity that became apparent. Therefore we did end up actually seeing her for a full visit with myself. She is having some pain at this site as well but fortunately nothing too significant at this point. No fevers, chills, nausea, or vomiting noted at this time. 3/18-Patient is back to the clinic with the left leg venous leg ulcer, the ulcer is larger in size, has a surface that is densely adherent with fibrinous tissue, the Hydrofera Blue was used but is densely adherent and there was difficulty in removing it. The right lower extremity was also wrapped for weeping edema. Patient has a new area over the left lateral foot above the malleolus that is small and appears to have no debris with intact surrounding skin. Patient is on increased dose of Lasix also as a means to edema management 3/25; the patient has a nonhealing venous ulcer on the medial left leg and last week developed a smaller area on the lateral left calf. We have been using Hydrofera Blue with a contact layer. 4/1; no major change in these wounds areas. Left medial and more recently left lateral calf. I tried Iodoflex last week to aid in debridement she did not tolerate this. She stated her pain was terrible all week. She took the top layer of the 4 layer compression off. 4/8; the patient actually looks somewhat better in terms of her more prominent left lateral calf wound. There is some healthy looking tissue here. She is still complaining of a lot of discomfort. 4/15; patient in a lot of pain secondary to sciatica. She is on a prednisone taper prescribed by her primary physician. She  has the 2 areas one on the left medial and more recently a smaller area on the left lateral calf. Both of these just above the malleoli 4/22; her back pain is better but she still states she is very uncomfortable and now feels she is intolerant to the [employer]. No real change in the wounds we have been using Sorbact. She has been previously intolerant to Iodoflex. There is not a lot of option about what we can use to debride this wound under compression that she no doubt needs. sHe states Ultram no longer works for her pain 4/29; no major change in the wounds slightly increased depth. Surface on the original medial wound perhaps somewhat improved however the more recent area on the lateral left ankle is 100% covered in very adherent debris we have been using Sorbact. She tolerates 4 layer compression well and her edema control is a lot better. She has not had to come in for a nurse check 5/6; no major change in the condition of the wounds. She did consent to debridement today which was done with some difficulty. Continuing Sorbact.  She did not tolerate Iodoflex. She was in for a check of her compression the day after we wrapped her last week this was adjusted but MALIJAH, LIETZ. ([id number]) nothing much was found 5/13; no major change in the condition or area of the wounds. I was able to get a fairly aggressive debridement done on the lateral left leg wound. Even using Sorbact under compression. She came back in on Friday to have the wrap changed. She says she felt uncomfortable on the lateral aspect of her ankle. She has a long history of chronic venous insufficiency including previous ablation surgery on this side. 5/20-Patient returns for wounds on left leg with both wounds covered in slough, with the lateral leg wound larger in size, she has been in 3 layer compression and felt more comfortable, she describes pain in ankle, in leg and pins and needles in foot, and is about to try Pamelor for  this 6/3; wounds on the left lateral and left medial leg. The area medially which is the most recent of the 2 seems to have had the largest increase in dimensions. We have been using Sorbac to try and debride the surface. She has been to see orthopedics they apparently did a plain x-ray that was indeterminant. Diagnosed her with neuropathy and they have ordered an MRI to determine if there is underlying osteomyelitis. This was not high on my thought list but I suppose it is prudent. We have advised her to make an appointment with vein and vascular in [place]. She has a history of a left greater saphenous and accessory vein ablations I wonder if there is anything else that can be done from a surgical point of view to help in these difficult refractory wounds. We have previously healed this wound on one occasion but it keeps on reopening [medial side] 6/10; deep tissue culture I did last week I think on the left medial wound showed both moderate E. coli and moderate staph aureus [MSSA]. She is going to require antibiotics and I have chosen Augmentin. We have been using Sorbact and we have made better looking wound surface on both sides but certainly no improvement in wound area. She was back in last Friday apparently for a dressing changes the wrap was hurting her outer left ankle. She has not managed to get a hold of vein and vascular in [place]. We are going to have to make her that appointment 6/17; patient is tolerating the Augmentin. She had an MRI that I think was ordered by orthopedic surgeon this did not show osteomyelitis or an abscess did suggest cellulitis. We have been using Sorbact to the lateral and medial ankles. We have been trying to arrange a follow-up appointment with vein and vascular in [place] or did her original ablations. We apparently an area sent the request to vein and vascular in [facility] 6/24; patient has completed the Augmentin. We do not yet have a vein and  vascular appointment in [place]. I am not sure what the issue is here we have asked her to call tomorrow. We are using Sorbact. Making some improvements and especially the medial wound. Both surfaces however look better medial and lateral. 7/1; the patient has been in contact with vein and vascular in [place] but has not yet received an appointment. Using Sorbact we have gradually improve the wound surface with no improvement in surface area. She is approved for Apligraf but the wound surface still is not completely viable. She has not had to come in  for a dressing change 7/8; the patient has an appointment with vein and vascular on 7/31 which is a Friday afternoon. She is concerned about getting back here for [place] to dress her wounds. I think it is important to have them goal for her venous reflux/history of ablations etc. to see if anything else can be done. She apparently tested positive for 1 of the blood tests with regards to lupus and saw a rheumatologist. He has raised the issue of vasculitis again. I have had this thought in the past however the evidence seems overwhelming that this is a venous reflux etiology. If the rheumatologist tells me there is clinical and laboratory investigation is positive for lupus I will rethink this. 7/15; the patient's wound surfaces are quite a bit better. The medial area which was her original wound now has no depth although the lateral wound which was the more recent area actually appears larger. Both with viable surfaces which is indeed better. Using Sorbact. I wanted to use Apligraf on her however there is the issue of the vein and vascular appointment on 7/31 at 2:00 in the afternoon which would not allow her to get back to be rewrapped and they would no doubt remove the graft 7/22; the patient's wound surfaces have moderate amount of debris although generally look better. The lateral one is larger with 2 small satellite areas superiorly. We are  waiting for her vein and vascular appointment on 7/31. She has been approved for Apligraf which I would like to use after th 7/29; wound surfaces have improved no debridement is required we have been using Sorbact. She sees vein and vascular on Friday with this so question of whether anything can be done to lessen the likelihood of recurrence and/or speed the healing of these areas. She is already had previous ablations. She no doubt has severe venous hypertension 8/5-Patient returns at 1 week, she was in [place] for 3 days by her podiatrist, we have been using so backed to the wound, she has increased pain in both the wounds on the left lower leg especially the more distal one on the lateral aspect 8/12-Patient returns at 1 week and she is agreeable to having debridement in both wounds on her left leg today. We have been using Sorbact, and vascular studies were reviewed at last visit 8/19; the patient arrives with her wounds fairly clean and no debridement is required. We have used Sorbact which is really done a nice job in cleaning up these very difficult wound surfaces. The patient saw Dr. Donzetta Matters of vascular surgery on 7/31. He did not feel that there was an arterial component. He felt that her treated greater saphenous vein is adequately addressed and that the small saphenous vein did not appear to be involved significantly. She was also noted to have deep venous reflux which is not treatable. Dr. Donzetta Matters mentioned the possibility of a central obstructive component leading to reflux and he offered her central venography. She wanted to discuss this or think about it. I have urged her to go ahead with this. She has had recurrent difficult wounds in these areas which do heal but after months in the clinic. If there is anything that can be done to reduce the likelihood of this I think it is worth it. 9/2 she is still working towards getting follow-up with Dr. Donzetta Matters to schedule her CT. Things are quite a  bit worse venography. I put Apligraf on 2 weeks ago on both wounds on the  medial and lateral part of her left lower leg. She arrives in clinic today with 3 superficial additional wounds above the area laterally and one below the wound medially. She describes a lot of discomfort. I think these are probably wrapped injuries. Does not look like she has cellulitis. 07/20/2019 on evaluation today patient appears to be doing somewhat poorly in regard to her lower extremity ulcers. She in fact showed signs of erythema in fact we may even be dealing with an infection at this time. Unfortunately I am unsure if this is just infection or if indeed there may be some allergic reaction that occurred as a result of the Apligraf application. With that being said that would be unusual but nonetheless not impossible in this patient is one who is unfortunately allergic to quite a bit. Currently we have been using the Sorbact which seems to do as well as anything for her. I do think we may want to obtain a culture today to see if there is anything showing up there that may need to be addressed. 9/16; noted that last week the wounds look worse in 1 week follow-up of the Apligraf. Using Sorbact as of 2 days ago. She arrives with copious amounts of drainage and new skin breakdown on the back of the left calf. The wounds arm more substantial bilaterally. There is a fair amount of swelling in the left calf no overt DVT there is edema present I think in the left greater than right thigh. She is supposed to go on 9/28 for CT venography. The wounds on the medial and lateral calf are worse and she has new skin breakdown posteriorly at least new for me. This is almost developing into a circumferential wound area The Apligraf was taken off last week which I agree with things are not going in the right direction a culture was done we do not have that back yet. She is on Augmentin that she started 2 days ago 9/23; dressing was  changed by her nurses on Monday. In general there is no improvement in the wound areas although the area looks less angry than last week. She did get Augmentin for MSSA cultured on the 14th. She still appears to have too much swelling in the left leg even with 3 layer compression 9/30; the patient underwent her procedure on 9/28 by Dr. Donzetta Matters at vascular and vein specialist. She was discovered to have the common iliac vein measuring 12.2 mm but at the level of L4-L5 measured 3 mm. After stenting it measured 10 mm. 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 Rouleaux flow in the common femoral and femoral vein was observed much improved after stenting. We are using silver alginate to the wounds on the medial and lateral ankle on the left. 4 layer compression NYELLI, SAMARA ([id number]) 10/7; the patient had fluid swelling around her knee and 4 layer compression. At the advice of vein and vascular this was reduced to 3 layer which she is tolerating better. We have been using silver alginate under 3 layer compression since last Friday 10/14; arrives with the areas on the left ankle looking a lot better. Inflammation in the area also a lot better. She came in for a nurse check on 10/9 10/21; continued nice improvement. Slight improvements in surface area of both the medial and lateral wounds on the left. A lot of the satellite lesions in the weeping erythema around these from stasis dermatitis is resolved. We have been using silver  alginate 10/28; general improvement in the entire wound areas although not a lot of change in dimensions the wound certainly looks better. There is a lot less in terms of venous inflammation. Continue silver alginate this week however look towards Hydrofera Blue next week 11/4; very adherent debris on the medial wound left wound is not as bad. We have been using silver alginate. Change to [facility] today 11/11; very adherent debris on both wound areas. She  went to vein and vascular last week and follow-up they put in [place] boot on this today. He says the [facility] was adherent. Wound is definitely not as good as last week. Especially on the left there the satellite lesions look more prominent 11/18; absolutely no better. erythema on lateral aspect with tenderness. 09/30/2019 on evaluation today patient appears to actually be doing better. Dr. Dellia Nims did put her on doxycycline last week which I do believe has helped her at this point. Fortunately there is no signs of active infection at this time. No fevers, chills, nausea, vomiting, or diarrhea. I do believe he may want extend the doxycycline for 7 additional days just to ensure everything does completely cleared up the patient is in agreement with that plan. Otherwise she is going require some sharp debridement today 12/2; patient is completing a 2-week course of doxycycline. I gave her this empirically for inflammation as well as infection when I last saw her 2 weeks ago. All of this seems to be better. She is using silver alginate she has the area on the medial aspect of the larger area laterally and the 2 small satellite regions laterally above the major wound. 12/9; the patient's wound on the left medial and left lateral calf look really quite good. We have been using silver alginate. She saw vein and vascular in follow-up on 10/09/2019. She has had a previous left greater saphenous vein ablation by Dr. Oscar La in 2016. More recently she underwent a left common iliac vein stent by Dr. 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 The swelling is improved and certainly the wounds have improved. The patient shows [place] today area on the right medial calf there is almost no wound but leaking lymphedema. She says she start this started 3 or 4 days ago. She did not traumatize it. It is not painful. She does not wear compression on that side 12/16; the patient continues to do well laterally.  Medially still requiring debridement. The area on the right calf did not materialize to anything and is not currently open. We wrapped this last time. She has support stockings for that leg although I am not sure they are going to provide adequate compression 12/23; the lateral wound looks stable. Medially still requiring debridement for tightly adherent fibrinous debris. We've been using silver alginate. Surface area not any different 12/30; neither wound is any better with regards to surface and the area on the left lateral is larger. I been using silver alginate to the left lateral which look quite good last week and Sorbact to the left medial 11/11/2019. Lateral wound area actually looks better and somewhat smaller. Medial still requires a very aggressive debridement today. We have been using Sorbact on both wound areas 1/13; not much better still adherent debris bilaterally. I been using Sorbact. She has severe venous hypertension. Probably some degree of dermal fibrosis distally. I wonder whether tighter compression might help and I am going to try that today. We also need to work on the bioburden 1/20;  using Sorbact. She has severe venous hypertension status post stent placement for pelvic vein compression. We applied gentamicin last time to see if we could reduce bioburden I had some discussion with her today about the use of pentoxifylline. This is occasionally used in this setting for wounds with refractory venous insufficiency. However this interacts with Plavix. She tells me that she was put on this after stent placement for 3 months. She will call Dr. Claretha Cooper office to discuss 1/27; we are using gentamicin under Sorbact. 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 She has a stent. Wound medially is measuring smaller this week. Laterally measuring slightly larger although she has some satellite lesions superiorly 2/3; gentamicin under Sorbact under 4-layer  compression. 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 She has a stent on Plavix. Her wounds are measuring smaller this week. More substantially laterally where there is a satellite lesion superiorly. 2/10; gentamicin under Sorbac. 4-layer compression. Patient communicated with Dr. Donzetta Matters at vein and vascular in [place]. He is okay with the patient coming off Plavix I will therefore start her on pentoxifylline for a 1 month trial. In general her wounds look better today. I had some concerns about swelling in the left thigh however she measures 61.5 on the right and 63 on the mid thigh which does not suggest there is any difficulty. The patient is not describing any pain. 2/17; gentamicin under Sorbac 4-layer compression. She has been on pentoxifylline for 1 week and complains of loose stool. No nausea she is eating and drinking well 2/24; the patient apparently came in 2 days ago for a nurse visit when her wrap fell down. Both areas look a little worse this week macerated medially and satellite lesions laterally. Change to silver alginate today 3/3; wounds are larger today especially medially. She also has more swelling in her foot lower leg and I even noted some swelling in her posterior thigh which is tender. I wonder about the patency of her stent. Fortuitously she sees Dr. Claretha Cooper group on Friday 3/10; Mrs. Witherington was seen by vein and vascular on 3/5. The patient underwent ultrasound. There was no evidence of thrombosis involving the IVC no evidence of thrombosis involving the right common iliac vein there is no evidence of thrombosis involving the right external iliac vein the left external vein is also patent. The right common iliac vein stent appears patent bilateral common femoral veins are compressible and appear patent. I was concerned about the left common iliac stent however it looks like this is functional. She has some edema in the posterior thigh that was  tender she still has that this week. I also note they had trouble finding the pulses in her left foot and booked her for an ABI baseline in 4 weeks. She will follow up in 6 months for repeat IVC duplex. The patient stopped the pentoxifylline because of diarrhea. It does not look like that was being effective in any case. I have advised her to go back on her aspirin 81 mg tablet, vascular it also suggested this 3/17; comes in today with her wound surfaces a lot better. The excoriations from last week considerably better probably secondary to the TCA. We have been using silver alginate 3/24; comes in today with smaller wounds both medially and laterally. Both required debridement. There are 2 small satellite areas superiorly laterally. She also has a very odd bandlike area in the mid calf almost looking like there was a weakness in the wrap  in a localized area. I would write this off as being this however anteriorly she has a small raised ballotable area that is very tender almost reminiscent of an abscess but there was no LAQUITA, HARLAN. ([id number]) obvious purulent surface to it. 02/04/20 upon evaluation today patient appears to be doing fairly well in regard to her wounds today. Fortunately there is no signs of active infection at this time. No fevers, chills, nausea, vomiting, or diarrhea. She has been tolerating the dressing changes without complication. Fortunately I feel like she is showing signs of improvement although has been sometime since have seen her. Nonetheless the area of concern that Dr. Dellia Nims had last week where she had possibly an area of the wrap that was we can allow the leg to bulge appears to be doing significantly better today there is no signs of anything worsening. 4/7; the patient's wounds on her medial and lateral left leg continue to contract. We have been using a regular alginate. Last week she developed an area on the right medial lower leg which is probably a venous  ulcer as well. 4/14; the wounds on her left medial and lateral lower leg continue to contract. Surface eschar. We have been using regular alginate. The area on the right medial lower leg is closed. We have been putting both legs under 4-layer contraction. The patient went back to see vein and vascular she had arterial studies done which were apparently "quite good" per the patient although I have not read their notes I have never felt she had an arterial issue. 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 This is been longstanding and refractory to exercise, leg elevation and longstanding use of compression wraps in our clinic as well as compression stockings on the times we have been able to get these to heal 4/21; we thought she actually might be close this week however she arrives in clinic with a lot of edema in her upper left calf and into her posterior thigh. This is been an intermittent problem here. She says the wrap fell down but it was replaced with a nurse visit on Monday. We are using calcium alginate to the wounds and the wound sizes there not terribly larger than last week but there is a lot more edema 4/28; again wound edges are smaller on both sides. Her edema is better controlled than last time. She is obtained her compression pumps from [employer] although they have not been to her home to set these up. 5/5; left medial and left lateral both look stable. I am not sure the medial is any smaller. We have been using calcium alginate under 4-layer compression. She had an area on the right medial. This was eschared today. We have been wrapping this as well. She does not tolerate external compression stockings due to a history of various contact allergies. She has her compression pumps however the representative from the company is coming on her to show her how to use these tomorrow 5/19; patient with severe chronic venous insufficiency  secondary to central venous disease. She had a stent placed in her left common iliac vein. She has done better since but still difficult to control wounds. She comes in today with nothing open on the right leg. Her areas on the left medial and left lateral are just about closed. We are using calcium alginate under 4-layer compression. She is using her external compression pumps at home She only has 15-20 support stockings. States  she cannot get anything tighter than that on. 03/30/20-Patient returns at 1 week, the wounds on the left leg are both slightly bigger, the last week she was on 3 layer compression which started to slide down. She is starting to use her lymphedema pumps although she stated on 1 day her right ankle started to swell up and she have to stop that day. Unfortunately the open area seem to oscillate between improving to the point of healing and then flaring up all to do with effectiveness of compression or lack of due to the left leg topography not keeping the compression wraps from rolling down 6/2 patient comes in with a 15/20 mmHg stocking on the right leg. She tells me that she developed a lot of swelling in her ankles she saw orthopedics she was felt to possibly be having a flare of pseudogout versus some other type of arthritis. She was put on steroids for a respiratory issue so that helps with the inflammation. She has not been using the pumps all week. She thinks the left thigh is more swollen than usual and I would agree with that. She has an appointment with Dr. Donzetta Matters 9 days or so from now 6/9; both wounds on the left medial and left lateral are smaller. We have been using calcium alginate under compression. She does not have an open wound on the right leg she is using a stocking and her compression pumps things are going well. She has an appointment with Dr. Donzetta Matters with regards to her stent in the left common iliac vein 6/16; the wounds on the left medial and left lateral  ankle continues to contract. The patient saw Dr. Donzetta Matters and I think he seems satisfied. Ordered follow-up venous reflux studies on both sides in September. Cautioned that she may need thigh-high stockings. She has been using calcium alginate under compression on the left and her own stocking on the right leg. She tells [place] there are no open wounds on the right 6/23; left lateral is just about closed. Medial required debridement today. We have been using calcium alginate. Extensive discussion about the compression pumps she is only using these on 25 mmHg states she could not take 40 or 30 when the wrap came out to her home to demonstrate these. He said they should not feel tight 6/30; the left lateral wound has a slight amount of eschar. . The area medially is about the same using Hydrofera Blue. 7/7; left lateral wound still has some eschar. I will remove this next week may be closed. The area medially is very small using Hydrofera Blue with improvement. Unfortunately the stockings fell down. Unfortunately the blisters have developed at the edge of where the wrap fell. When this happened she says her legs hurt she did not use her pumps. We are not open Monday for her to come in and change the wraps and she had an appointment yesterday. She also tells me that she is going to have an MRI of her back. She is having pain radiating into her left anterior leg she thinks her from an L5 disc. She saw Dr. Ellene Route of neurosurgery 7/14; the area on the left lateral ankle area is closed. Still a small area medially however it looks better as well. We have been using Hydrofera Blue under 4-layer compression 7/21; left lateral ankle is still closed however her wound on the medial left calf is actually larger. This is probably because Hydrofera Blue got stuck to the wound. She came  in for a nurse change on Friday and will do that again this week I was concerned about the amount of swelling that she had last week  however she is using her compression pumps twice a day and the swelling seems well controlled 7/28; remaining wound on the left medial lower leg is smaller. We have been using moistened silver collagen under compression she is coming back for a nurse visit. For reasons that were not really clear she was just keeping her legs elevated and not using her compression pumps. I have asked her to use the compression pumps. She does not have any wounds on the right leg 06/15/20-Patient returns at 2 weeks, her LLE edema is worse and she developed a blister wound that is new and has bigger posterior calf wound on right, we are using Prisma with pad, 4 layer compression. she has been on lasix 40 mg daily 8/18; patient arrives today with things a lot worse than I remember from a few weeks ago. She was seen last week. Noted that her edema was worse and that she now had a left lateral wound as well as deteriorating edema in the medial and posterior part of the lower leg. She says she is using his or her external compression pumps once a day although I wonder about the compliance. 8/25; weeping area on the right medial lower leg. This had actually gotten a small localized area of her compression stocking wet. On the left side there is a large denuded area on the posterior medial lower leg and smaller area on the lateral. This was not the original areas that we dealt with. 9/1 the patient's wound on the left leg include the left lateral and left posterior. Larger superficial wounds weeping. She has very poor edema control. Tender localized edema in the left lower medial ankle/heel probably because of localized wrap issues. She freely admits she is not using [place], [place] ([id number]) the compression pumps. She has been up on her feet a lot. She thinks the hydrofera blue is contributing to the pain she is experiencing.. This is a complaint that I have occasionally heard 9/8; really not much improvement. The patient  is still complaining of a lot of pain particularly when she uses compression pumps. I switched her to silver alginate last time because she found the Hydrofera Blue to be irritating. I don't hear much difference in her description with the silver alginate. 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 She has follow-up with Dr. Donzetta Matters I think for ultrasound next month 9/15; quite a bit of improvement today. We have less edema and more epithelialization in both of her wound areas on the left medial and left lateral calf. These are not the site of her original wounds in this area. She says she has been using her compression pumps for 30 minutes twice a day, there pain issues that never quite understood. Silver alginate as the primary dressing 9/22; continued improvement. Both areas medially and laterally still have a small open area there is some eschar. She continues to complain of left medial ankle pain. Swelling in the leg is in much better condition. We have been using silver alginate 9/29; continued improvement. Both areas medially and laterally in the left calf look as though they are close some minor surface eschar but I think this is epithelialized. She comes in today saying she has a ruptured disc  at L4-L5 cannot bend over to put on her stockings. 10/6; patient comes in today with no open wounds on either leg. However her edema on the left leg in the upper one third of the lower leg is poorly controlled nonpitting. She says that she could not use the pumps for 2 days and then she has been using the last couple of days. It is not clear to me she has been able to get her stocking on. She has back problems. Mrs. Knoebel has severe chronic venous insufficiency with secondary lymphedema. Her venous insufficiency is partially centrally mediated and that she is now post stent in the left common iliac vein. [facility] Thurner's  syndrome/physiology]. She follows up with them on 10/15. She wears 20/30 below-knee stockings. She is supposed to use compression pumps at home although I think her compliance about with this is been less than 100%. I have asked her to use these 3 times a day. Finally I think she has lipodermatosclerosis in the left lower leg with an inverted bottle sign. It is been a major problem controlling the edema in the left leg. The right leg we have had wounds on but not as significant a problem is on the left READMISSION 04/12/2021 Mrs. Flury is a 75 year old woman we know well in this clinic. She has severe chronic venous insufficiency. 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 I believe she has had bilateral greater saphenous vein ablation in the past as well. She tells me that this wound opened sometime in March. She had a fall and thinks it was initially abrasion. She developed areas she describes as little blisters on the anterior part of her leg and she saw dermatology and was treated for methicillin staph aureus with several rounds of antibiotics. She has been using support stockings on the left leg and says this is the only thing she can get on. Her compression pump use maybe once a day she says if she did not use one she use the other. She comes in today with incredible swelling in the left leg with a wound on the left posterior calf. She has been using Neosporin to this previously a hydrocolloid. 6/15; patient arrives back for 1 week follow-up.Marland Kitchen Apparently her wrap fell down she did not call us to replace this. He has poor edema control. She only uses her compression pumps once a day 6/29; patient presents for 1 week follow-up. She has tolerated the compression wrap well. We have been using Iodoflex under the wrap. She has no issues or complaints today. Patient History Information obtained from Patient. Family History Cancer - Mother,Paternal  Grandparents, Hypertension - Mother,Father, Kidney Disease - Paternal Grandparents, Lung Disease - Paternal Grandparents, No family history of Diabetes, Heart Disease, Hereditary Spherocytosis, Seizures, Stroke, Thyroid Problems, Tuberculosis. Social History Former smoker - quit at age 26 - ended on 04/19/1966, Marital Status - Divorced, Alcohol Use - Never, Drug Use - No History, Caffeine Use - Daily. Medical History Eyes Patient has history of Cataracts Denies history of Glaucoma, Optic Neuritis Ear/Nose/Mouth/Throat Denies history of Chronic sinus problems/congestion, Middle ear problems Hematologic/Lymphatic Denies history of Anemia, Hemophilia, Human Immunodeficiency Virus, Lymphedema, Sickle Cell Disease Respiratory Patient has history of Asthma, Sleep Apnea Denies history of Aspiration, Chronic Obstructive Pulmonary Disease (COPD), Pneumothorax, Tuberculosis Cardiovascular Patient has history of Deep Vein Thrombosis - LLE 2010, Hypertension, Peripheral Venous Disease Denies history of Angina, Arrhythmia, Congestive Heart Failure, Coronary Artery Disease, Hypotension, Myocardial  Infarction, Peripheral Arterial Disease, Phlebitis, Vasculitis Gastrointestinal Denies history of Cirrhosis , Colitis, Crohn s, Hepatitis A, Hepatitis B, Hepatitis C Endocrine Denies history of Type I Diabetes, Type II Diabetes Genitourinary Denies history of End Stage Renal Disease Immunological Denies history of Lupus Erythematosus, Raynaud s, Scleroderma Integumentary (Skin) Denies history of History of Burn, History of pressure wounds Musculoskeletal Patient has history of Osteoarthritis - neck Melchor, Lenox J. ([id number]) Denies history of Gout, Rheumatoid Arthritis, Osteomyelitis Neurologic Denies history of Dementia, Neuropathy, Quadriplegia, Paraplegia, Seizure Disorder Oncologic Patient has history of Received Chemotherapy - interfeon immunotherapy, Received Radiation Psychiatric Denies  history of Anorexia/bulimia, Confinement Anxiety Hospitalization/Surgery History - Melanoma left shoulder. Medical And Surgical History Notes Constitutional Symptoms (General Health) Vein ablation LLE 2010 Vascular Sx ( vein ablationo) LLE 02/2015 Eyes Horners syndrome Genitourinary Kidney stones Neurologic ct scan showed swollen lymph nodes Oncologic Melanoma (R) shoulder 07/2010; (R) axillary lymphnode removal Objective Constitutional Vitals Time Taken: 2:58 PM, Height: 63 in, Weight: 212 lbs, BMI: 37.6, Temperature: 98.5 F, Pulse: 81 bpm, Respiratory Rate: 20 breaths/min, Blood Pressure: 180/82 mmHg. General Notes: Left lower extremity: To the posterior aspect there is an open wound with nonviable tissue present and surrounding granulation tissue circumferentially. No signs of infection Integumentary (Hair, Skin) Wound #13 status is Open. Original cause of wound was Gradually Appeared. The date acquired was: 02/24/2021. The wound has been in treatment 3 weeks. The wound is located on the Left,Distal,Posterior Lower Leg. The wound measures 2cm length x 2.5cm width x 0.1cm depth; 3.927cm^2 area and 0.393cm^3 volume. There is Fat Layer (Subcutaneous Tissue) exposed. There is no tunneling or undermining noted. There is a large amount of serosanguineous drainage noted. There is small (1-33%) red, hyper - granulation within the wound bed. There is a large (67- 100%) amount of necrotic tissue within the wound bed including Adherent Slough. Assessment Active Problems ICD-10 Chronic venous hypertension (idiopathic) with ulcer and inflammation of left lower extremity Lymphedema, not elsewhere classified Non-pressure chronic ulcer of left calf limited to breakdown of skin Patient's wound has shown improvement in size over the past few weeks. She is currently only using iodoform under 3-layer compression and we will continue this. No obvious signs of an infection. We will see her back in 1  week. Procedures Wound #13 Pre-procedure diagnosis of Wound #13 is a Venous Leg Ulcer located on the Left,Distal,Posterior Lower Leg . There was a Three RUBERTA, HOLCK ([id number]) Compression Therapy Procedure with a pre-treatment ABI of 0.9 by Cornell Barman, RN. Post procedure Diagnosis Wound #13: Same as Pre-Procedure Plan Follow-up Appointments: Wound #13 Left,Distal,Posterior Lower Leg: Return Appointment in 1 week. Nurse Visit as needed Bathing/ Shower/ Hygiene: May shower with wound dressing protected with water repellent cover or cast protector. Anesthetic (Use 'Patient Medications' Section for Anesthetic Order Entry): Lidocaine applied to wound bed Edema Control - Lymphedema / Segmental Compressive Device / Other: Elevate, Exercise Daily and Avoid Standing for Long Periods of Time. Elevate legs to the level of the heart and pump ankles as often as possible Elevate leg(s) parallel to the floor when sitting. Compression Pump: Use compression pump on left lower extremity for 60 minutes, twice daily. - TWICE DAILY for 1 hour Compression Pump: Use compression pump on right lower extremity for 60 minutes, twice daily. - TWICE DAILY for 1 hour WOUND #13: - Lower Leg Wound Laterality: Left, Posterior, Distal Cleanser: Soap and Water Discharge Instructions: Gently cleanse wound with antibacterial soap, rinse and pat dry prior to dressing  wounds Primary Dressing: Iodosorb 40 (g) Discharge Instructions: Apply IodoSorb to wound bed only as directed. Secondary Dressing: ABD Pad 5x9 (in/in) Discharge Instructions: Cover with ABD pad Compression Wrap: Profore Lite LF 3 Multilayer Compression Bandaging System Discharge Instructions: Apply 3 multi-layer wrap as prescribed. 1. Iodoform under 3 layer compression 2. Follow-up in 1 week. Electronic Signature(s) Signed: 05/03/2021 3:26:16 PM By: Kalman Shan DO Entered By: Kalman Shan on 05/03/2021 15:22:38 TIMESHA, CERVANTEZ  ([id number]) -------------------------------------------------------------------------------- ROS/PFSH Details Patient Name: YARIANA, HOAGLUND. Date of Service: 05/03/2021 2:30 PM Medical Record Number: [id number] Patient Account Number: [id number] Date of Birth/Sex: 12-11-1945 (75 y.o. F) Treating RN: Cornell Barman Primary Care Provider: [name] Other Clinician: Referring Provider: [name] Treating Provider/Extender: Yaakov Guthrie in Treatment: 3 Label Progress Note Print Version as History and Physical for this encounter Information Obtained From Patient Constitutional Symptoms (General Health) Medical History: Past Medical History Notes: Vein ablation LLE 2010 Vascular Sx ( vein ablationo) LLE 02/2015 Eyes Medical History: Positive for: Cataracts Negative for: Glaucoma; Optic Neuritis Past Medical History Notes: Horners syndrome Ear/Nose/Mouth/Throat Medical History: Negative for: Chronic sinus problems/congestion; Middle ear problems Hematologic/Lymphatic Medical History: Negative for: Anemia; Hemophilia; Human Immunodeficiency Virus; Lymphedema; Sickle Cell Disease Respiratory Medical History: Positive for: Asthma; Sleep Apnea Negative for: Aspiration; Chronic Obstructive Pulmonary Disease (COPD); Pneumothorax; Tuberculosis Cardiovascular Medical History: Positive for: Deep Vein Thrombosis - LLE 2010; Hypertension; Peripheral Venous Disease Negative for: Angina; Arrhythmia; Congestive Heart Failure; Coronary Artery Disease; Hypotension; Myocardial Infarction; Peripheral Arterial Disease; Phlebitis; Vasculitis Gastrointestinal Medical History: Negative for: Cirrhosis ; Colitis; Crohnos; Hepatitis A; Hepatitis B; Hepatitis C Endocrine Medical History: Negative for: Type I Diabetes; Type II Diabetes Genitourinary Medical History: Negative for: End Stage Renal Disease Past Medical History Notes: Kidney stones TIERRAH, ANASTOS  ([id number]) Immunological Medical History: Negative for: Lupus Erythematosus; Raynaudos; Scleroderma Integumentary (Skin) Medical History: Negative for: History of Burn; History of pressure wounds Musculoskeletal Medical History: Positive for: Osteoarthritis - neck Negative for: Gout; Rheumatoid Arthritis; Osteomyelitis Neurologic Medical History: Negative for: Dementia; Neuropathy; Quadriplegia; Paraplegia; Seizure Disorder Past Medical History Notes: ct scan showed swollen lymph nodes Oncologic Medical History: Positive for: Received Chemotherapy - interfeon immunotherapy; Received Radiation Past Medical History Notes: Melanoma (R) shoulder 07/2010; (R) axillary lymphnode removal Psychiatric Medical History: Negative for: Anorexia/bulimia; Confinement Anxiety HBO Extended History Items Eyes: Cataracts Immunizations Pneumococcal Vaccine: Received Pneumococcal Vaccination: Yes Immunization Notes: tetanus shot w/in the last 5 years per pt Implantable Devices None Hospitalization / Surgery History Type of Hospitalization/Surgery Melanoma left shoulder Family and Social History Cancer: Yes - Mother,Paternal Grandparents; Diabetes: No; Heart Disease: No; Hereditary Spherocytosis: No; Hypertension: Yes - Mother,Father; Kidney Disease: Yes - Paternal Grandparents; Lung Disease: Yes - Paternal Grandparents; Seizures: No; Stroke: No; Thyroid Problems: No; Tuberculosis: No; Former smoker - quit at age 51 - ended on 04/19/1966; Marital Status - Divorced; Alcohol Use: Never; Drug Use: No History; Caffeine Use: Daily; Financial Concerns: No; Food, Clothing or Shelter Needs: No; Support System Lacking: No; Transportation Concerns: No Electronic Signature(s) Signed: 05/03/2021 3:26:16 PM By: Kalman Shan DO Signed: 05/22/2021 1:36:15 PM By: [name], [name], RN, CWS, [name], [name] Entered By: Kalman Shan on 05/03/2021 15:20:08 MIKIALA, FUGETT  ([id number]) -------------------------------------------------------------------------------- SuperBill Details Patient Name: TRISTINA, SAHAGIAN. Date of Service: 05/03/2021 Medical Record Number: [id number] Patient Account Number: [id number] Date of Birth/Sex: 05-29-1946 (75 y.o. F) Treating RN: Cornell Barman Primary Care Provider: [name] Other Clinician: Referring Provider: [name] Treating Provider/Extender: Yaakov Guthrie in Treatment: 3  Diagnosis Coding ICD-10 Codes Code Description I87.332 Chronic venous hypertension (idiopathic) with ulcer and inflammation of left lower extremity I89.0 Lymphedema, not elsewhere classified L97.221 Non-pressure chronic ulcer of left calf limited to breakdown of skin Facility Procedures CPT4 Code: 10312811 Description: (Facility Use Only) [phone] - [facility] LWR LT LEG Modifier: Quantity: 1 Physician Procedures CPT4 Code Description: [id number] - WC PHYS LEVEL 3 - EST PT Modifier: Quantity: 1 CPT4 Code Description: ICD-10 Diagnosis Description I87.332 Chronic venous hypertension (idiopathic) with ulcer and inflammation of I89.0 Lymphedema, not elsewhere classified L97.221 Non-pressure chronic ulcer of left calf limited to breakdown of skin Modifier: left lower extremity Quantity: Electronic Signature(s) Signed: 05/03/2021 3:26:16 PM By: Kalman Shan DO Entered By: Kalman Shan on 05/03/2021 15:25:55

## 2021-05-24 ENCOUNTER — Encounter: Payer: Medicare Other | Admitting: Internal Medicine

## 2021-05-25 ENCOUNTER — Other Ambulatory Visit: Payer: Self-pay

## 2021-05-25 DIAGNOSIS — I87332 Chronic venous hypertension (idiopathic) with ulcer and inflammation of left lower extremity: Secondary | ICD-10-CM | POA: Diagnosis not present

## 2021-05-25 NOTE — Progress Notes (Signed)
[name], Amber ([id number]) Visit Report for 05/25/2021 Arrival Information Details Patient Name: [name], [name]. Date of Service: 05/25/2021 8:00 AM Medical Record Number: [id number] Patient Account Number: [id number] Date of Birth/Sex: Jun 12, 1946 (75 y.o. F) Treating RN: Donnamarie Poag Primary Care Lashanta Elbe: [name] Other Clinician: Referring Sandford Diop: [name] Treating Rhodia Acres/Extender: [name] in Treatment: 6 Visit Information History Since Last Visit Added or deleted any medications: No Patient Arrived: Ambulatory Had a fall or experienced change in No Arrival Time: 08:05 activities of daily living that may affect Accompanied By: self risk of falls: Transfer Assistance: None Hospitalized since last visit: No Patient Identification Verified: Yes Has Dressing in Place as Prescribed: Yes Secondary Verification Process Completed: Yes Has Compression in Place as Prescribed: Yes Patient Requires Transmission-Based No Pain Present Now: Yes Precautions: Patient Has Alerts: Yes Patient Alerts: Patient on Blood Thinner ASPIRIN NOT diabetic Electronic Signature(s) Signed: 05/25/2021 10:03:03 AM By: Donnamarie Poag Entered By: Donnamarie Poag on 05/25/2021 08:08:46 Vermeer, [name] ([id number]) -------------------------------------------------------------------------------- Clinic Level of Care Assessment Details Patient Name: [name], Amber. Date of Service: 05/25/2021 8:00 AM Medical Record Number: [id number] Patient Account Number: [id number] Date of Birth/Sex: [birth year] (75 y.o. F) Treating RN: Donnamarie Poag Primary Care Yanis Larin: [name] Other Clinician: Referring Mckensie Scotti: [name] Treating Breydan Shillingburg/Extender: [name] in Treatment: 6 Clinic Level of Care Assessment Items TOOL 1 Quantity Score []  - Use when EandM and Procedure is performed on INITIAL visit 0 ASSESSMENTS - Nursing Assessment / Reassessment []  - General Physical  Exam (combine w/ comprehensive assessment (listed just below) when performed on new 0 pt. evals) []  - 0 Comprehensive Assessment (HX, ROS, Risk Assessments, Wounds Hx, etc.) ASSESSMENTS - Wound and Skin Assessment / Reassessment []  - Dermatologic / Skin Assessment (not related to wound area) 0 ASSESSMENTS - Ostomy and/or Continence Assessment and Care []  - Incontinence Assessment and Management 0 []  - 0 Ostomy Care Assessment and Management (repouching, etc.) PROCESS - Coordination of Care []  - Simple Patient / Family Education for ongoing care 0 []  - 0 Complex (extensive) Patient / Family Education for ongoing care []  - 0 Staff obtains Programmer, systems, Records, Test Results / Process Orders []  - 0 Staff telephones HHA, Nursing Homes / Clarify orders / etc []  - 0 Routine Transfer to another Facility (non-emergent condition) []  - 0 Routine Hospital Admission (non-emergent condition) []  - 0 New Admissions / Biomedical engineer / Ordering NPWT, Apligraf, etc. []  - 0 Emergency Hospital Admission (emergent condition) PROCESS - Special Needs []  - Pediatric / Minor Patient Management 0 []  - 0 Isolation Patient Management []  - 0 Hearing / Language / Visual special needs []  - 0 Assessment of Community assistance (transportation, D/C planning, etc.) []  - 0 Additional assistance / Altered mentation []  - 0 Support Surface(s) Assessment (bed, cushion, seat, etc.) INTERVENTIONS - Miscellaneous []  - External ear exam 0 []  - 0 Patient Transfer (multiple staff / Civil Service fast streamer / Similar devices) []  - 0 Simple Staple / Suture removal (25 or less) []  - 0 Complex Staple / Suture removal (26 or more) []  - 0 Hypo/Hyperglycemic Management (do not check if billed separately) []  - 0 Ankle / Brachial Index (ABI) - do not check if billed separately Has the patient been seen at the hospital within the last three years: Yes Total Score: 0 Level Of Care: ____ [name]  ([id number]) Electronic Signature(s) Signed: 05/25/2021 10:03:03 AM By: Donnamarie Poag Entered By: Donnamarie Poag on 05/25/2021 08:19:44 Anchondo, [name] ([id number]) --------------------------------------------------------------------------------  Encounter Discharge Information Details Patient Name: [name], Amber. Date of Service: 05/25/2021 8:00 AM Medical Record Number: [id number] Patient Account Number: [id number] Date of Birth/Sex: [birth year] (76 y.o. F) Treating RN: Donnamarie Poag Primary Care Eaden Hettinger: [name] Other Clinician: Referring Macdonald Rigor: [name] Treating Ronnica Dreese/Extender: [name] in Treatment: 6 Encounter Discharge Information Items Discharge Condition: Stable Ambulatory Status: Ambulatory Discharge Destination: Home Transportation: Private Auto Accompanied By: self Schedule Follow-up Appointment: Yes Clinical Summary of Care: Electronic Signature(s) Signed: 05/25/2021 10:03:03 AM By: Donnamarie Poag Entered By: Donnamarie Poag on 05/25/2021 08:19:36 Speedy, [name] ([id number]) -------------------------------------------------------------------------------- Wound Assessment Details Patient Name: [name], Amber. Date of Service: 05/25/2021 8:00 AM Medical Record Number: [id number] Patient Account Number: [id number] Date of Birth/Sex: June 08, 1946 (75 y.o. F) Treating RN: Donnamarie Poag Primary Care Amyriah Buras: [name] Other Clinician: Referring Amoreena Neubert: [name] Treating Terre Zabriskie/Extender: Jeri Cos Weeks in Treatment: 6 Wound Status Wound Number: 13 Primary Venous Leg Ulcer Etiology: Wound Location: Left, Distal, Posterior Lower Leg Wound Open Wounding Event: Gradually Appeared Status: Date Acquired: 02/24/2021 Comorbid Cataracts, Asthma, Sleep Apnea, Deep Vein Thrombosis, Weeks Of Treatment: 6 History: Hypertension, Peripheral Venous Disease, Osteoarthritis, Clustered Wound: No Received Chemotherapy, Received Radiation Wound  Measurements Length: (cm) 2.4 Width: (cm) 2.2 Depth: (cm) 0.1 Area: (cm) 4.147 Volume: (cm) 0.415 % Reduction in Area: 29.6% % Reduction in Volume: 29.5% Epithelialization: None Wound Description Classification: Full Thickness Without Exposed Support Structures Exudate Amount: Medium Exudate Type: Serosanguineous Exudate Color: red, brown Foul Odor After Cleansing: No Slough/Fibrino Yes Wound Bed Granulation Amount: Medium (34-66%) Exposed Structure Granulation Quality: Pink Fascia Exposed: No Necrotic Amount: Medium (34-66%) Fat Layer (Subcutaneous Tissue) Exposed: Yes Necrotic Quality: Adherent Slough Tendon Exposed: No Muscle Exposed: No Joint Exposed: No Bone Exposed: No Treatment Notes Wound #13 (Lower Leg) Wound Laterality: Left, Posterior, Distal Cleanser Soap and Water Discharge Instruction: Gently cleanse wound with antibacterial soap, rinse and pat dry prior to dressing wounds Peri-Wound Care Topical Santyl Collagenase Ointment, 30 (gm), tube Discharge Instruction: apply nickel thick to wound bed only Primary Dressing Hydrofera Blue Ready Transfer Foam, 2.5x2.5 (in/in) Discharge Instruction: Apply Hydrofera Blue Ready to wound bed as directed Secondary Dressing ABD Pad 5x9 (in/in) Discharge Instruction: Cover with ABD pad Secured With ANTONIETA, SLAVEN ([id number]) Compression Wrap Profore Lite LF 3 Multilayer Compression Bandaging System Discharge Instruction: Apply 3 multi-layer wrap as prescribed. Compression Stockings Add-Ons Electronic Signature(s) Signed: 05/25/2021 10:03:03 AM By: Donnamarie Poag Entered ByDonnamarie Poag on 05/25/2021 08:09:09

## 2021-05-26 ENCOUNTER — Ambulatory Visit (HOSPITAL_COMMUNITY)
Admission: RE | Admit: 2021-05-26 | Discharge: 2021-05-26 | Disposition: A | Discharge status: Home / Self Care | Payer: Medicare Other | Source: Ambulatory Visit | Attending: Vascular Surgery | Admitting: Vascular Surgery

## 2021-05-26 ENCOUNTER — Ambulatory Visit (INDEPENDENT_AMBULATORY_CARE_PROVIDER_SITE_OTHER): Payer: Medicare Other | Admitting: Vascular Surgery

## 2021-05-26 ENCOUNTER — Encounter: Payer: Self-pay | Admitting: Vascular Surgery

## 2021-05-26 VITALS — BP 148/83 | HR 78 | Temp 98.4°F | Resp 20 | Ht 63.0 in | Wt 211.4 lb

## 2021-05-26 DIAGNOSIS — I872 Venous insufficiency (chronic) (peripheral): Secondary | ICD-10-CM

## 2021-05-26 DIAGNOSIS — R609 Edema, unspecified: Secondary | ICD-10-CM

## 2021-05-26 DIAGNOSIS — I83029 Varicose veins of left lower extremity with ulcer of unspecified site: Secondary | ICD-10-CM | POA: Diagnosis not present

## 2021-05-26 DIAGNOSIS — L97929 Non-pressure chronic ulcer of unspecified part of left lower leg with unspecified severity: Secondary | ICD-10-CM | POA: Diagnosis not present

## 2021-05-26 DIAGNOSIS — I83892 Varicose veins of left lower extremities with other complications: Secondary | ICD-10-CM

## 2021-05-26 DIAGNOSIS — I871 Compression of vein: Secondary | ICD-10-CM | POA: Diagnosis not present

## 2021-05-26 NOTE — Progress Notes (Signed)
Patient ID: [name], female   DOB: 03/30/1946, 75 y.o.   [name]  Reason for Consult: Follow-up   Referred by [name], MD  Subjective:     HPI:  [name] is a 75 y.o. female history of greater saphenous and accessory saphenous vein ablation on the left performed in [facility].  She also had a May Thurner syndrome treated by me.  She remains on aspirin.  She had healed all of her wounds to the left lower extremity but these unfortunately recurred a few months back and she is currently being evaluated at the [place] wound [facility] center states that her wound has recently gone from 5 to 1 cm.  She is also developed some scabs on the medial aspect of her right leg.  She has never had any venous intervention of the right lower extremity.  She has normal ABIs in the past.  She does have left greater than right lower extremity swelling and this is stable after knee surgery many years ago.  She does not have any fevers or chills.  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/2016   Chronic venous insufficiency 2016   severe reflux with painful varicosities sees [facility]   Clotting disorder ([place])    due to vein ablation    Complication of anesthesia    difficulty coming out due to sleep apnea per pt   Difficult intubation    PATIENT DENIES    Diverticulosis    per ct scans 09-2016 and 01-2017   DVT (deep venous thrombosis) ([place]) 2011   small, developed after venous ablation   DVT (deep venous thrombosis) ([place])    Family history of adverse reaction to anesthesia    O2 levels drop upon waking    Hearing loss sensory, bilateral 2013   mod-severe high freq sensorineural ([facility])   Hiatal hernia     09-2016 ct scan [facility] at cancer center     History of kidney stones 1980s   History of radiation therapy 07/19/11-08/25/2011   RIGHT AXILLARY REGION/METASTATIC   Hypertension    Melanoma ([place]) 06/29/10   MALIGNANT MELANOMA R SHOULDER/SUPRASCAPULAR BACK s/p interferon chemo and XRT   Neuromuscular disorder ([facility])    muscle spasms   OSA (obstructive sleep apnea)    on CPAP   Pneumonia    2001   RLS (restless legs syndrome)    Seasonal allergies    Sleep apnea    wears cpap   Tinnitus    Unspecified vitamin D deficiency 03/18/2013   Varicose veins of left lower extremity    Family History  Problem Relation Age of Onset   Cancer Mother 42       uterine   CAD Father 26       MI   Hypertension Father 48   Alzheimer's disease Father 31   Cancer Cousin        x2, breast   Diabetes Sister    Cancer Paternal Grandmother        melanoma, possibly   Colon cancer Neg Hx    Colon polyps Neg Hx    Rectal cancer Neg Hx    Stomach cancer  Neg Hx    Past Surgical History:  Procedure Laterality Date   ABI  2016   WNL   ANTERIOR CERVICAL DECOMP/DISCECTOMY FUSION N/A 08/22/2015   ANTERIOR CERVICAL DECOMPRESSION FUSION C3/4 - interbody graft and anterior plate; Kristeen Miss, MD   ANTERIOR CERVICAL DECOMP/DISCECTOMY FUSION N/A 07/02/2016   Procedure: Cervical four- five Cervical five- six Cervical six- seven Anterior cervical decompression/diskectomy/fusion;  Surgeon: Kristeen Miss, MD;  Location: [facility];  Service: Neurosurgery;  Laterality: N/A;  C4-5 C5-6 C6-7 Anterior cervical decompression/diskectomy/fusion   BREAST BIOPSY Left 2013   BENIGN   BREAST SURGERY Right 1990   BX    CARDIOVASCULAR STRESS TEST  2010   normal stress test, EF 66%   CATARACT EXTRACTION W/ INTRAOCULAR LENS IMPLANT Left 2019   Dr. Kathrin Penner   COLONOSCOPY  03/2005   benign polyp, int hem, rpt 5 yrs ([place], [place])   COLONOSCOPY WITH PROPOFOL N/A 01/06/2018   diverticulosis, decreased sphincter tone, no f/u needed [name], Kirke Corin, MD)   [place] / EXCISION  2012   RIGHT   DEXA  06/2016   WNL   DILATION AND CURETTAGE OF UTERUS  2010   ENDOVENOUS ABLATION SAPHENOUS VEIN W/ LASER Left 2010   [facility]   ENDOVENOUS ABLATION SAPHENOUS VEIN W/ LASER Left 2016   accessory branch [facility] Kellie Simmering)   ESOPHAGOGASTRODUODENOSCOPY  2006   mod chronic carditis, no H pylori ([place], [place])   INTRAVASCULAR ULTRASOUND/IVUS N/A 08/03/2019   Waynetta Sandy, MD   [place]   left   LOWER EXTREMITY VENOGRAPHY Left 08/03/2019   Waynetta Sandy, MD   LUMBAR LAMINECTOMY  2001   L4/5   MELANOMA EXCISION  2011   Right shoulder, with sentinel lymph node biopsy   PERIPHERAL VASCULAR INTERVENTION Left 08/03/2019   Stent of left common iliac vein with 14 x 60 mm Vici Donzetta Matters, [place] Dom, MD)   [facility] REMOVAL  12/05/2011   Procedure: REMOVAL PORT-A-CATH;  Surgeon: Rolm Bookbinder, MD;  Location: [place];  Service: General;  Laterality: N/A;  left port removal   PORTACATH PLACEMENT     left subclavian   UPPER GASTROINTESTINAL ENDOSCOPY      Short Social History:  Social History   Tobacco Use   Smoking status: Former    Types: Cigarettes    Quit date: 11/05/1965    Years since quitting: 55.5   Smokeless tobacco: Never   Tobacco comments:    socially as a teen  Substance Use Topics   Alcohol use: No    Alcohol/week: 0.0 standard drinks    Allergies  Allergen Reactions   Sulfa Antibiotics Itching, Swelling and Shortness Of Breath    Facial swelling   Voltaren [Diclofenac Sodium] Shortness Of Breath   Latex Other (See Comments)    Blisters ONLY HAD REACTION TO TAPE  (??? ONLY ADHESIVE ALLERGY)   Tape Other (See Comments)    Caused blisters - must use paper tape - same reaction from NeoPrene   Doxycycline Other (See Comments)    Diaphoresis and dizziness   Other     Neoprene    Current Outpatient Medications  Medication Sig Dispense Refill    acetaminophen (TYLENOL) 650 MG CR tablet Take 1,300 mg by mouth every 8 (eight) hours as needed for pain.      albuterol (VENTOLIN HFA) 108 (90 Base) MCG/ACT inhaler INHALE 2 PUFFS BY MOUTH EVERY 6 HORUS AS NEEDED FOR  WHEEZING/SHORTNESS OF BREATH 8.5 g 3   aspirin EC 81 MG tablet Take 81 mg by mouth at bedtime. Melanoma prevention     cephALEXin (KEFLEX) 500 MG capsule Take 500 mg by mouth 3 (three) times daily.     furosemide (LASIX) 40 MG tablet Take 1 tablet (40 mg total) by mouth 2 (two) times daily as needed for fluid. Take second dose if needed for leg swelling 60 tablet 6   KLOR-CON M20 20 MEQ tablet TAKE 1 TABLET BY MOUTH TWICE A DAY 60 tablet 5   loratadine (CLARITIN) 10 MG tablet Take 10 mg by mouth daily.     losartan (COZAAR) 25 MG tablet TAKE 2 TABLETS BY MOUTH EVERY DAY 60 tablet 5   meloxicam (MOBIC) 15 MG tablet TAKE 1 TABLET BY MOUTH EVERY DAY AS NEEDED FOR PAIN 30 tablet 1   montelukast (SINGULAIR) 10 MG tablet TAKE 1 TABLET BY MOUTH EVERY DAY 30 tablet 3   Multiple Vitamin (MULTIVITAMIN WITH MINERALS) TABS tablet Take 1 tablet by mouth daily.     Polyvinyl Alcohol-Povidone (TEARS PLUS OP) Place 1 drop into both eyes daily as needed (dry eyes/ redness/ burning).      PRESCRIPTION MEDICATION CPAP     Probiotic Product (PROBIOTIC DAILY PO) Take 1 tablet by mouth daily.      traMADol (ULTRAM) 50 MG tablet TAKE 1 TABLET BY MOUTH EVERY 8 HOURS AS NEEDED FOR UP TO 5 DAYS FOR MODERATE PAIN. 15 tablet 0   triamcinolone (NASACORT) 55 MCG/ACT AERO nasal inhaler Place 2 sprays into the nose daily. In each nostril     triamcinolone cream (KENALOG) 0.1 % Apply 1 application topically 2 (two) times daily. Apply to AA. Max 2 weeks at a time. 80 g 1   UNABLE TO FIND Take by mouth.     Vitamin D, Ergocalciferol, (DRISDOL) 1.25 MG (50000 UNIT) CAPS capsule TAKE 1 CAPSULE BY MOUTH EVERY 7 DAYS 12 capsule 3   No current facility-administered medications for this visit.    Review of Systems   Constitutional:  Constitutional negative. HENT: HENT negative.  Eyes: Eyes negative.  Respiratory: Respiratory negative.  Cardiovascular: Positive for leg swelling.  GI: Gastrointestinal negative.  Skin: Positive for wound.  Neurological: Neurological negative. Hematologic: Hematologic/lymphatic negative.  Psychiatric: Psychiatric negative.       Objective:  Objective   Vitals:   05/26/21 0937  BP: (!) 148/83  Pulse: 78  Resp: 20  Temp: 98.4 F (36.9 C)  SpO2: 96%  Weight: 211 lb 6.4 oz (95.9 kg)  Height: '5\' 3"'$  (1.6 m)   Body mass index is 37.45 kg/m.  Physical Exam HENT:     Head: Normocephalic.     Nose:     Comments: Wearing a mask Cardiovascular:     Pulses:          Posterior tibial pulses are 2+ on the right side.     Comments: Dressing on place left lower extremity pulses could not be evaluated Abdominal:     General: Abdomen is flat.  Musculoskeletal:     Right lower leg: Edema present.     Left lower leg: Edema present.  Skin:    Comments: There is a compression dressing of the left lower extremity There are small sores on the medial aspect of the right calf  Neurological:     General: No focal deficit present.     Mental Status: She is alert.  Psychiatric:  Mood and Affect: Mood normal.        Behavior: Behavior normal.        Thought Content: Thought content normal.    Data: IVC/Iliac Findings:  +----------+------+--------+--------+     IVC    PatentThrombusComments  +----------+------+--------+--------+  IVC Prox  patent                  +----------+------+--------+--------+  IVC Mid   patent                  +----------+------+--------+--------+  IVC Distalpatent                  +----------+------+--------+--------+      +-------------------+---------+-----------+---------+-----------+--------+          CIV        RT-PatentRT-ThrombusLT-PatentLT-ThrombusComments   +-------------------+---------+-----------+---------+-----------+--------+  Common Iliac Prox                       patent                       +-------------------+---------+-----------+---------+-----------+--------+  Common Iliac Mid                        patent                       +-------------------+---------+-----------+---------+-----------+--------+  Common Iliac Distal                     patent                       +-------------------+---------+-----------+---------+-----------+--------+       +-------------------------+---------+-----------+---------+-----------+----  ----+             EIV            RT-PatentRT-ThrombusLT-PatentLT-ThrombusComments  +-------------------------+---------+-----------+---------+-----------+----  ----+  External Iliac Vein Prox                      patent                         +-------------------------+---------+-----------+---------+-----------+----  ----+  External Iliac Vein Mid                       patent                         +-------------------------+---------+-----------+---------+-----------+----  ----+  External Iliac Vein                           patent                         Distal                                                                       +-------------------------+---------+-----------+---------+-----------+----  ----+          Summary:  IVC/Iliac: There is no evidence of thrombus involving the left common  iliac vein.  There is no evidence of thrombus involving the  left external iliac vein.  Difficult to delineate ends of stent.      Assessment/Plan:     75 year old female with the above-noted history.  Stent appears patent on today's exam by duplex.  She does have new wounds on the right lower extremity with healing wounds of the left lower extremity.  We will have her follow-up in short order with venous reflux testing of the right.   She previously did have some reflux I am concerned that given the location of the new wounds there may be a venous component.  She previously does have normal ABIs palpable pulses on the right side, the left side cannot be palpated due to presence of compression dressing.     Waynetta Sandy MD [facility]

## 2021-05-29 ENCOUNTER — Other Ambulatory Visit: Payer: Self-pay | Admitting: *Deleted

## 2021-05-29 DIAGNOSIS — I872 Venous insufficiency (chronic) (peripheral): Secondary | ICD-10-CM

## 2021-05-31 ENCOUNTER — Encounter (HOSPITAL_BASED_OUTPATIENT_CLINIC_OR_DEPARTMENT_OTHER): Payer: Medicare Other | Admitting: Internal Medicine

## 2021-05-31 ENCOUNTER — Other Ambulatory Visit: Payer: Self-pay

## 2021-05-31 DIAGNOSIS — I87332 Chronic venous hypertension (idiopathic) with ulcer and inflammation of left lower extremity: Secondary | ICD-10-CM | POA: Diagnosis not present

## 2021-05-31 DIAGNOSIS — I890 Lymphedema, not elsewhere classified: Secondary | ICD-10-CM

## 2021-05-31 DIAGNOSIS — L97221 Non-pressure chronic ulcer of left calf limited to breakdown of skin: Secondary | ICD-10-CM

## 2021-06-03 NOTE — Progress Notes (Signed)
[name], [name] Visit Report for 05/31/2021 Chief Complaint Document Details Patient Name: [name], [name]. Date of Service: 05/31/2021 1:30 PM Medical Record Number: [id number] Patient Account Number: [id number] Date of Birth/Sex: 02/01/1946 (75 y.o. F) Treating RN: Cornell Barman Primary Care Provider: [name] Other Clinician: Referring Provider: [name] Treating Provider/Extender: Yaakov Guthrie in Treatment: 7 Information Obtained from: Patient Chief Complaint Left calf venous stasis ulceration. 11/13/16; the patient is here for a left calf recurrent ulceration which is painful and has been present for the last month 01/22/17; the patient re-presents here for recurrent difficulties with blistering and leaking fluid on her left posterior calf 12/10/18; the patient returns to clinic for a wound on the left medial calf 04/12/2021; left lower extremity wound, 05/31/21; right leg wound Electronic Signature(s) Signed: 05/31/2021 3:06:46 PM By: Kalman Shan DO Entered By: Kalman Shan on 05/31/2021 14:30:30 Percival, [name] -------------------------------------------------------------------------------- Debridement Details Patient Name: [name]. Date of Service: 05/31/2021 1:30 PM Medical Record Number: [id number] Patient Account Number: [id number] Date of Birth/Sex: 12/24/1945 (75 y.o. F) Treating RN: Carlene Coria Primary Care Provider: [name] Other Clinician: Referring Provider: [name] Treating Provider/Extender: Yaakov Guthrie in Treatment: 7 Debridement Performed for Wound #13 Left,Distal,Posterior Lower Leg Assessment: Performed By: Physician Kalman Shan, MD Debridement Type: Debridement Severity of Tissue Pre Debridement: Fat layer exposed Level of Consciousness (Pre- Awake and Alert procedure): Pre-procedure Verification/Time Out Yes - 14:24 Taken: Start Time: 14:24 Pain Control:  Lidocaine 4% Topical Solution Total Area Debrided (L x W): 2 (cm) x 2 (cm) = 4 (cm) Tissue and other material Viable, Non-Viable, Slough, Subcutaneous, Skin: Dermis , Skin: Epidermis, Slough debrided: Level: Skin/Subcutaneous Tissue Debridement Description: Excisional Instrument: Curette Bleeding: Moderate Hemostasis Achieved: Pressure End Time: 14:27 Procedural Pain: 0 Post Procedural Pain: 0 Response to Treatment: Procedure was tolerated well Level of Consciousness (Post- Awake and Alert procedure): Post Debridement Measurements of Total Wound Length: (cm) 2 Width: (cm) 2 Depth: (cm) 0.1 Volume: (cm) 0.314 Character of Wound/Ulcer Post Debridement: Improved Severity of Tissue Post Debridement: Fat layer exposed Post Procedure Diagnosis Same as Pre-procedure Electronic Signature(s) Signed: 05/31/2021 3:06:46 PM By: Kalman Shan DO Signed: 06/02/2021 4:32:50 PM By: Carlene Coria RN Entered By: Carlene Coria on 05/31/2021 14:25:48 [name], [name] -------------------------------------------------------------------------------- HPI Details Patient Name: [name], [name]. Date of Service: 05/31/2021 1:30 PM Medical Record Number: [id number] Patient Account Number: [id number] Date of Birth/Sex: [birth year] (75 y.o. F) Treating RN: Cornell Barman Primary Care Provider: [name] Other Clinician: Referring Provider: [name] Treating Provider/Extender: Yaakov Guthrie in Treatment: 7 History of Present Illness HPI Description: Pleasant 75 year old with history of chronic venous insufficiency. No diabetes or peripheral vascular disease. Left ABI 1.29. Questionable history of left lower extremity DVT. She developed a recurrent ulceration on her left lateral calf in December 2015, which she attributes to poor diet and subsequent lower extremity edema. She underwent endovenous laser ablation of her left greater saphenous vein in 2010. She underwent  laser ablation of accessory branch of left GSV in April 2016 by Dr. Kellie Simmering at [facility]. She was previously wearing Unna boots, which she tolerated well. Tolerating 2 layer compression and cadexomer iodine. She returns to clinic for follow-up and is without new complaints. She denies any significant pain at this time. She reports persistent pain with pressure. No claudication or ischemic rest pain. No fever or chills. No drainage. READMISSION 11/13/16; this is a 75 year old woman who is not a diabetic. She is  here for a review of a painful area on her left medial lower extremity. I note that she was seen here previously last year for wound I believe to be in the same area. At that time she had undergone previously a left greater saphenous vein ablation by Dr. Kellie Simmering and she had a ablation of the anterior accessory branch of the left greater saphenous vein in March 2016. Seeing that the wound actually closed over. In reviewing the history with her today the ulcer in this area has been recurrent. She describes a biopsy of this area in 2009 that only showed stasis physiology. 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 Do not see that she has ever had arterial studies in the left leg. She tells me she has a follow-up with Dr. Kellie Simmering in roughly 10 days In any case she developed the reopening of this area roughly a month ago. On the background of this she describes rapidly increasing edema which has responded to Lasix 40 mg and metolazone 2.5 mg as well as the patient's lymph massage. She has been told she has both venous insufficiency and lymphedema but she cannot tolerate compression stockings 11/28/16; the patient saw Dr. Kellie Simmering recently. Per the patient he did arterial Dopplers in the  office that did not show evidence of arterial insufficiency, per the patient he stated "treat this like an ordinary venous ulcer". She also saw her dermatologist Dr. Ronnald Ramp who felt that this was more of a vascular ulcer. In general things are improving although she arrives today with increasing bilateral lower extremity edema with weeping a deeper fluid through the wound on the left medial leg compatible with some degree of lymphedema 12/04/16; the patient's wound is fully epithelialized but I don't think fully healed. We will do another week of depression with Promogran and TCA however I suspect we'll be able to discharge her next week. This is a very unusual-looking wound which was initially a figure-of-eight type wound lying on its side surrounded by petechial like hemorrhage. She has had venous ablation on this side. She apparently does not have an arterial issue per Dr. Kellie Simmering. She saw her dermatologist thought it was "vascular". Patient is definitely going to need ongoing compression and I talked about this with her today she will go to elastic therapy after she leaves here next week 12/11/16; the patient's wound is not completely closed today. She has surrounding scar tissue and in further discussion with the patient it would appear that she had ulcers in this area in 2009 for a prolonged period of time ultimately requiring a punch biopsy of this area that only showed venous insufficiency. I did not previously pickup on this part of the history from the patient. 12/18/16; the patient's wound is completely epithelialized. There is no open area here. She has significant bilateral venous insufficiency with secondary lymphedema to a mild-to-moderate degree she does not have compression stockings.. She did not say anything to me when I was in the room, she told our intake nurse that she was still having pain in this area. This isn't unusual recurrent small open area. She is going to go to elastic  therapy to obtain compression stockings. 12/25/16; the patient's wound is fully epithelialized. There is no open area here. The patient  describes some continued episodic discomfort in this area medial left calf. However everything looks fine and healed here. She is been to elastic therapy and caught herself 15-20 mmHg stockings, they apparently were having trouble getting 20-30 mm stockings in her size 01/22/17; this is a patient we discharged from the clinic a month ago. She has a recurrent open wound on her medial left calf. She had 15 mm support stockings. I told her I thought she needed 20-30 mm compression stockings. She tells me that she has been ill with hospitalization secondary to asthma and is been found to have severe hypokalemia likely secondary to a combination of Lasix and metolazone. This morning she noted blistering and leaking fluid on the posterior part of her left leg. She called our intake nurse urgently and we was saw her this afternoon. She has not had any real discomfort here. I don't know that she's been wearing any stockings on this leg for at least 2-3 days. ABIs in this clinic were 1.21 on the right and 1.3 on the left. She is previously seen vascular surgery who does not think that there is a peripheral arterial issue. 01/30/17; Patient arrives with no open wound on the left leg. She has been to elastic therapy and obtained 20-10mhg below knee stockings and she has one on the right leg today. READMISSION 02/19/18; this PCoralesis a now 75year old patient we've had in this clinic perhaps 3 times before. I had last looked at her from January 07 December 2016 with an area on the medial left leg. We discharged her on 12/25/16 however she had to be readmitted on 01/22/17 with a recurrence. I have in my notes that we discharged her on 20-30 mm stockings although she tells me she was only wearing support hose because she cannot get stockings on predominantly related to her cervical  spine surgery/issues. She has [place] on the left with an anterior accessory branch ablation I think both of these were in 2016. On one of the previous visit she had a biopsy noted 2009 that was negative. She is not felt to have an arterial issue. She is not a diabetic. She does have a history of obstructive sleep apnea hypertension asthma as well as chronic venous insufficiency and lymphedema. On this occasion she noted 2 dry scaly patch on her left leg. She tried to put lotion on this it didn't really help. There were 2 open areas.the patient has been seeing her primary physician from 02/05/18 through 02/14/18. She had Unna boots applied. The superior wound now on the lateral left leg has closed but she's had one wound that remains open on the lateral left leg. This is not the same spot as we dealt with in 2018. ABIs in this clinic were 1.3 bilaterally PDEBORH, MANDEL([id number] 02/26/18; patient has a small wound on the left lateral calf. Dimensions are down. She has chronic venous insufficiency and lymphedema. 03/05/18; small open area on the left lateral calf. Dimensions are down. Tightly adherent necrotic debris over the surface of the wound which was difficult to remove. Also the dressing [over collagen] stuck to the wound surface. This was removed with some difficulty as well. Change the primary dressing to Hydrofera Blue ready 03/12/18; small open area on the left lateral calf. Comes in with tightly adherent surface eschar as well as some adherent Hydrofera Blue. 03/19/18; open area on the left lateral calf.  Again adherent surface eschar as well as some adherent Hydrofera Blue nonviable subcutaneous tissue. She complained of pain all week even with the reduction from 4-3 layer compression I put on last week. Also she had an increase in her ankle and calf measurements probably related to the same  thing. 03/26/18; open area on the left lateral calf. A very small open area remains here. We used silver alginate starting last week as the Hydrofera Blue seem to stick to the wound bed. In using 4-layer compression 04/02/18; the open area in the left lateral calf at some adherent slough which I removed there is no open area here. We are able to transition her into her own compression stocking. Truthfully I think this is probably his support hose. However this does not maintain skin integrity will be limited. She cannot put over the toe compression stockings on because of neck problems hand problems etc. She is allergic to the lining layer of juxta lites. We might be forced to use extremitease stocking should this fail READMIT 11/24/2018 Patient is now a 75 year old woman who is not a diabetic. She has been in this clinic on at least 3 previous occasions largely with recurrent wounds on her left leg secondary to chronic venous insufficiency with secondary lymphedema. Her situation is complicated by inability to get stockings on and an allergy to neoprene which is apparently a component and at least juxta lites and other stockings. As a result she really has not been wearing any stockings on her legs. She tells [place] that roughly 2 or 3 weeks ago she started noticing a stinging sensation just above her ankle on the left medial aspect. She has been diagnosed with pseudogout and she wondered whether this was what she was experiencing. She tried to dress this with something she bought at the store however subsequently it pulled skin off and now she has an open wound that is not improving. She has been using Vaseline gauze with a cover bandage. She saw her primary doctor last week who put an Haematologist on her. ABIs in this clinic was 1.03 on the left 2/12; the area is on the left medial ankle. Odd-looking wound with what looks to be surface epithelialization but a multitude of small petechial openings. This  clearly not closed yet. We have been using silver alginate under 3 layer compression with TCA 2/19; the wound area did not look quite as good this week. Necrotic debris over the majority of the wound surface which required debridement. She continues to have a multitude of what looked to be small petechial openings. She reminds [place] that she had a biopsy on this initially during her first outbreak in 2015 in [place] dermatology. She expresses concern about this being a possible melanoma. 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 I assured her that this does not look anything like melanoma. Except for the petechial reaction it does look like a venous insufficiency area and she certainly has evidence of this on both sides 2/26; a difficult area on the left medial ankle. The patient clearly has chronic venous hypertension with some degree of lymphedema. The odd thing about the area is the small petechial hemorrhages. I am not really sure how to explain this. This was present last time and this is not a compression injury. We have been using Hydrofera Blue which I changed to last week 3/4; still using Hydrofera Blue. Aggressive debridement  today. She does not have known arterial issues. She has seen Dr. Kellie Simmering at [facility] vein [facility] vascular and and has an ablation on the left. [Anterior accessory branch of the greater saphenous]. From what I remember they did not feel she had an arterial issue. The patient has had this area biopsied in 2009 at [facility] dermatology and by her recollection they said this was "stasis". She is also follow-up with dermatology locally who thought that this was more of a vascular issue 3/11; using Hydrofera Blue. Aggressive debridement today. She does not have an arterial issue. We are using 3 layer compression although we may need to go to 4. The patient has been in for multiple changes  to her wrap since I last saw her a week ago. She says that the area was leaking. I do not have too much more information on what was found 01/19/19 on evaluation today patient was actually being seen for a nurse visit when unfortunately she had the area on her left lateral lower extremity as well as weeping from the right lower extremity that became apparent. Therefore we did end up actually seeing her for a full visit with myself. She is having some pain at this site as well but fortunately nothing too significant at this point. No fevers, chills, nausea, or vomiting noted at this time. 3/18-Patient is back to the clinic with the left leg venous leg ulcer, the ulcer is larger in size, has a surface that is densely adherent with fibrinous tissue, the Hydrofera Blue was used but is densely adherent and there was difficulty in removing it. The right lower extremity was also wrapped for weeping edema. Patient has a new area over the left lateral foot above the malleolus that is small and appears to have no debris with intact surrounding skin. Patient is on increased dose of Lasix also as a means to edema management 3/25; the patient has a nonhealing venous ulcer on the medial left leg and last week developed a smaller area on the lateral left calf. We have been using Hydrofera Blue with a contact layer. 4/1; no major change in these wounds areas. Left medial and more recently left lateral calf. I tried Iodoflex last week to aid in debridement she did not tolerate this. She stated her pain was terrible all week. She took the top layer of the 4 layer compression off. 4/8; the patient actually looks somewhat better in terms of her more prominent left lateral calf wound. There is some healthy looking tissue here. She is still complaining of a lot of discomfort. 4/15; patient in a lot of pain secondary to sciatica. She is on a prednisone taper prescribed by her primary physician. She has the 2 areas one  on the left medial and more recently a smaller area on the left lateral calf. Both of these just above the malleoli 4/22; her back pain is better but she still states she is very uncomfortable and now feels she is intolerant to the [employer]. No real change in the wounds we have been using Sorbact. She has been previously intolerant to Iodoflex. There is not a lot of option about what we can use to debride this wound under compression that she no doubt needs. sHe states Ultram no longer works for her pain 4/29; no major change in the wounds slightly increased depth. Surface on the original medial wound perhaps somewhat improved however the more recent area on the lateral left ankle is 100% covered in very  adherent debris we have been using Sorbact. She tolerates 4 layer compression well and her edema control is a lot better. She has not had to come in for a nurse check 5/6; no major change in the condition of the wounds. She did consent to debridement today which was done with some difficulty. Continuing Sorbact. She did not tolerate Iodoflex. She was in for a check of her compression the day after we wrapped her last week this was adjusted but nothing much was found 5/13; no major change in the condition or area of the wounds. I was able to get a fairly aggressive debridement done on the lateral left leg wound. Even using Sorbact under compression. She came back in on Friday to have the wrap changed. She says she felt uncomfortable on the lateral aspect of her ankle. She has a long history of chronic venous insufficiency including previous ablation surgery on this side. 5/20-Patient returns for wounds on left leg with both wounds covered in slough, with the lateral leg wound larger in size, she has been in 3 layer compression and felt more comfortable, she describes pain in ankle, in leg and pins and needles in foot, and is about to try Pamelor for this 6/3; wounds on the left lateral and left  medial leg. The area medially which is the most recent of the 2 seems to have had the largest increase in Vado, KOY SZOPINSKI. ([id number]) dimensions. We have been using Sorbac to try and debride the surface. She has been to see orthopedics they apparently did a plain x-ray that was indeterminant. Diagnosed her with neuropathy and they have ordered an MRI to determine if there is underlying osteomyelitis. This was not high on my thought list but I suppose it is prudent. We have advised her to make an appointment with vein and vascular in [place]. She has a history of a left greater saphenous and accessory vein ablations I wonder if there is anything else that can be done from a surgical point of view to help in these difficult refractory wounds. We have previously healed this wound on one occasion but it keeps on reopening [medial side] 6/10; deep tissue culture I did last week I think on the left medial wound showed both moderate E. coli and moderate staph aureus [MSSA]. She is going to require antibiotics and I have chosen Augmentin. We have been using Sorbact and we have made better looking wound surface on both sides but certainly no improvement in wound area. She was back in last Friday apparently for a dressing changes the wrap was hurting her outer left ankle. She has not managed to get a hold of vein and vascular in [place]. We are going to have to make her that appointment 6/17; patient is tolerating the Augmentin. She had an MRI that I think was ordered by orthopedic surgeon this did not show osteomyelitis or an abscess did suggest cellulitis. We have been using Sorbact to the lateral and medial ankles. We have been trying to arrange a follow-up appointment with vein and vascular in [place] or did her original ablations. We apparently an area sent the request to vein and vascular in [facility] 6/24; patient has completed the Augmentin. We do not yet have a vein and vascular appointment  in [place]. I am not sure what the issue is here we have asked her to call tomorrow. We are using Sorbact. Making some improvements and especially the medial wound. Both surfaces however look better medial and lateral.  7/1; the patient has been in contact with vein and vascular in [place] but has not yet received an appointment. Using Sorbact we have gradually improve the wound surface with no improvement in surface area. She is approved for Apligraf but the wound surface still is not completely viable. She has not had to come in for a dressing change 7/8; the patient has an appointment with vein and vascular on 7/31 which is a Friday afternoon. She is concerned about getting back here for [place] to dress her wounds. I think it is important to have them goal for her venous reflux/history of ablations etc. to see if anything else can be done. She apparently tested positive for 1 of the blood tests with regards to lupus and saw a rheumatologist. He has raised the issue of vasculitis again. I have had this thought in the past however the evidence seems overwhelming that this is a venous reflux etiology. If the rheumatologist tells me there is clinical and laboratory investigation is positive for lupus I will rethink this. 7/15; the patient's wound surfaces are quite a bit better. The medial area which was her original wound now has no depth although the lateral wound which was the more recent area actually appears larger. Both with viable surfaces which is indeed better. Using Sorbact. I wanted to use Apligraf on her however there is the issue of the vein and vascular appointment on 7/31 at 2:00 in the afternoon which would not allow her to get back to be rewrapped and they would no doubt remove the graft 7/22; the patient's wound surfaces have moderate amount of debris although generally look better. The lateral one is larger with 2 small satellite areas superiorly. We are waiting for her vein and  vascular appointment on 7/31. She has been approved for Apligraf which I would like to use after th 7/29; wound surfaces have improved no debridement is required we have been using Sorbact. She sees vein and vascular on Friday with this so question of whether anything can be done to lessen the likelihood of recurrence and/or speed the healing of these areas. She is already had previous ablations. She no doubt has severe venous hypertension 8/5-Patient returns at 1 week, she was in [place] for 3 days by her podiatrist, we have been using so backed to the wound, she has increased pain in both the wounds on the left lower leg especially the more distal one on the lateral aspect 8/12-Patient returns at 1 week and she is agreeable to having debridement in both wounds on her left leg today. We have been using Sorbact, and vascular studies were reviewed at last visit 8/19; the patient arrives with her wounds fairly clean and no debridement is required. We have used Sorbact which is really done a nice job in cleaning up these very difficult wound surfaces. The patient saw Dr. Donzetta Matters of vascular surgery on 7/31. He did not feel that there was an arterial component. He felt that her treated greater saphenous vein is adequately addressed and that the small saphenous vein did not appear to be involved significantly. She was also noted to have deep venous reflux which is not treatable. Dr. Donzetta Matters mentioned the possibility of a central obstructive component leading to reflux and he offered her central venography. She wanted to discuss this or think about it. I have urged her to go ahead with this. She has had recurrent difficult wounds in these areas which do heal but after months in the  clinic. If there is anything that can be done to reduce the likelihood of this I think it is worth it. 9/2 she is still working towards getting follow-up with Dr. Donzetta Matters to schedule her CT. Things are quite a bit worse venography. I  put Apligraf on 2 weeks ago on both wounds on the medial and lateral part of her left lower leg. She arrives in clinic today with 3 superficial additional wounds above the area laterally and one below the wound medially. She describes a lot of discomfort. I think these are probably wrapped injuries. Does not look like she has cellulitis. 07/20/2019 on evaluation today patient appears to be doing somewhat poorly in regard to her lower extremity ulcers. She in fact showed signs of erythema in fact we may even be dealing with an infection at this time. Unfortunately I am unsure if this is just infection or if indeed there may be some allergic reaction that occurred as a result of the Apligraf application. With that being said that would be unusual but nonetheless not impossible in this patient is one who is unfortunately allergic to quite a bit. Currently we have been using the Sorbact which seems to do as well as anything for her. I do think we may want to obtain a culture today to see if there is anything showing up there that may need to be addressed. 9/16; noted that last week the wounds look worse in 1 week follow-up of the Apligraf. Using Sorbact as of 2 days ago. She arrives with copious amounts of drainage and new skin breakdown on the back of the left calf. The wounds arm more substantial bilaterally. There is a fair amount of swelling in the left calf no overt DVT there is edema present I think in the left greater than right thigh. She is supposed to go on 9/28 for CT venography. The wounds on the medial and lateral calf are worse and she has new skin breakdown posteriorly at least new for me. This is almost developing into a circumferential wound area The Apligraf was taken off last week which I agree with things are not going in the right direction a culture was done we do not have that back yet. She is on Augmentin that she started 2 days ago 9/23; dressing was changed by her nurses on  Monday. In general there is no improvement in the wound areas although the area looks less angry than last week. She did get Augmentin for MSSA cultured on the 14th. She still appears to have too much swelling in the left leg even with 3 layer compression 9/30; the patient underwent her procedure on 9/28 by Dr. Donzetta Matters at [facility]. She was discovered to have the common iliac vein measuring 12.2 mm but at the level of L4-L5 measured 3 mm. After stenting it measured 10 mm. 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 Rouleaux flow in the common femoral and femoral vein was observed much improved after stenting. We are using silver alginate to the wounds on the medial and lateral ankle on the left. 4 layer compression 10/7; the patient had fluid swelling around her knee and 4 layer compression. At the advice of vein and vascular this was reduced to 3 layer which she is tolerating better. We have been using silver alginate under 3 layer compression since last Friday 10/14; arrives with the areas on the left ankle looking a lot better. Inflammation in the area also a  lot better. She came in for a nurse check on 10/9 10/21; continued nice improvement. Slight improvements in surface area of both the medial and lateral wounds on the left. A lot of the satellite lesions in the weeping erythema around these from stasis dermatitis is resolved. We have been using silver alginate [name], [name] 10/28; general improvement in the entire wound areas although not a lot of change in dimensions the wound certainly looks better. There is a lot less in terms of venous inflammation. Continue silver alginate this week however look towards Hydrofera Blue next week 11/4; very adherent debris on the medial wound left wound is not as bad. We have been using silver alginate. Change to [facility] today 11/11; very adherent debris on both wound areas. She went to vein and vascular  last week and follow-up they put in [place] boot on this today. He says the [facility] was adherent. Wound is definitely not as good as last week. Especially on the left there the satellite lesions look more prominent 11/18; absolutely no better. erythema on lateral aspect with tenderness. 09/30/2019 on evaluation today patient appears to actually be doing better. Dr. Dellia Nims did put her on doxycycline last week which I do believe has helped her at this point. Fortunately there is no signs of active infection at this time. No fevers, chills, nausea, vomiting, or diarrhea. I do believe he may want extend the doxycycline for 7 additional days just to ensure everything does completely cleared up the patient is in agreement with that plan. Otherwise she is going require some sharp debridement today 12/2; patient is completing a 2-week course of doxycycline. I gave her this empirically for inflammation as well as infection when I last saw her 2 weeks ago. All of this seems to be better. She is using silver alginate she has the area on the medial aspect of the larger area laterally and the 2 small satellite regions laterally above the major wound. 12/9; the patient's wound on the left medial and left lateral calf look really quite good. We have been using silver alginate. She saw vein and vascular in follow-up on 10/09/2019. She has had a previous left greater saphenous vein ablation by Dr. Oscar La in 2016. More recently she underwent a left common iliac vein stent by Dr. 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 The swelling is improved and certainly the wounds have improved. The patient shows [place] today area on the right medial calf there is almost no wound but leaking lymphedema. She says she start this started 3 or 4 days ago. She did not traumatize it. It is not painful. She does not wear compression on that side 12/16; the patient continues to do well laterally. Medially still requiring  debridement. The area on the right calf did not materialize to anything and is not currently open. We wrapped this last time. She has support stockings for that leg although I am not sure they are going to provide adequate compression 12/23; the lateral wound looks stable. Medially still requiring debridement for tightly adherent fibrinous debris. We've been using silver alginate. Surface area not any different 12/30; neither wound is any better with regards to surface and the area on the left lateral is larger. I been using silver alginate to the left lateral which look quite good last week and Sorbact to the left medial 11/11/2019. Lateral wound area actually looks better and somewhat smaller. Medial still requires a very aggressive debridement today. We  have been using Sorbact on both wound areas 1/13; not much better still adherent debris bilaterally. I been using Sorbact. She has severe venous hypertension. Probably some degree of dermal fibrosis distally. I wonder whether tighter compression might help and I am going to try that today. We also need to work on the bioburden 1/20; using Sorbact. She has severe venous hypertension status post stent placement for pelvic vein compression. We applied gentamicin last time to see if we could reduce bioburden I had some discussion with her today about the use of pentoxifylline. This is occasionally used in this setting for wounds with refractory venous insufficiency. However this interacts with Plavix. She tells me that she was put on this after stent placement for 3 months. She will call Dr. Claretha Cooper office to discuss 1/27; we are using gentamicin under Sorbact. 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 She has a stent. Wound medially is measuring smaller this week. Laterally measuring slightly larger although she has some satellite lesions superiorly 2/3; gentamicin under Sorbact under 4-layer compression. She has severe  venous hypertension with may Thurner pathophysiology. She has a stent on Plavix. Her wounds are measuring smaller this week. More substantially laterally where there is a satellite lesion superiorly. 2/10; gentamicin under Sorbac. 4-layer compression. Patient communicated with Dr. Donzetta Matters at vein and vascular in [place]. He is okay with the patient coming off Plavix I will therefore start her on pentoxifylline for a 1 month trial. In general her wounds look better today. I had some concerns about swelling in the left thigh however she measures 61.5 on the right and 63 on the mid thigh which does not suggest there is any difficulty. The patient is not describing any pain. 2/17; gentamicin under Sorbac 4-layer compression. She has been on pentoxifylline for 1 week and complains of loose stool. No nausea she is eating and drinking well 2/24; the patient apparently came in 2 days ago for a nurse visit when her wrap fell down. Both areas look a little worse this week macerated medially and satellite lesions laterally. Change to silver alginate today 3/3; wounds are larger today especially medially. She also has more swelling in her foot lower leg and I even noted some swelling in her posterior thigh which is tender. I wonder about the patency of her stent. Fortuitously she sees Dr. Claretha Cooper group on Friday 3/10; Mrs. Steinberg was seen by vein and vascular on 3/5. The patient underwent ultrasound. There was no evidence of thrombosis involving the IVC no evidence of thrombosis involving the right common iliac vein there is no evidence of thrombosis involving the right external iliac vein the left external vein is also patent. The right common iliac vein stent appears patent bilateral common femoral veins are compressible and appear patent. I was concerned about the left common iliac stent however it looks like this is functional. She has some edema in the posterior thigh that was tender she still has that this  week. I also note they had trouble finding the pulses in her left foot and booked her for an ABI baseline in 4 weeks. She will follow up in 6 months for repeat IVC duplex. The patient stopped the pentoxifylline because of diarrhea. It does not look like that was being effective in any case. I have advised her to go back on her aspirin 81 mg tablet, vascular it also suggested this 3/17; comes in today with her wound surfaces a lot better. The excoriations from last week considerably  better probably secondary to the TCA. We have been using silver alginate 3/24; comes in today with smaller wounds both medially and laterally. Both required debridement. There are 2 small satellite areas superiorly laterally. She also has a very odd bandlike area in the mid calf almost looking like there was a weakness in the wrap in a localized area. I would write this off as being this however anteriorly she has a small raised ballotable area that is very tender almost reminiscent of an abscess but there was no obvious purulent surface to it. 02/04/20 upon evaluation today patient appears to be doing fairly well in regard to her wounds today. Fortunately there is no signs of active infection at this time. No fevers, chills, nausea, vomiting, or diarrhea. She has been tolerating the dressing changes without complication. Fortunately I feel like she is showing signs of improvement although has been sometime since have seen her. Nonetheless the area of concern that Dr. Dellia Nims had last week where she had possibly an area of the wrap that was we can allow the leg to bulge appears to be doing significantly better today there is no signs of anything worsening. [name], [name] 4/7; the patient's wounds on her medial and lateral left leg continue to contract. We have been using a regular alginate. Last week she developed an area on the right medial lower leg which is probably a venous ulcer as well. 4/14; the  wounds on her left medial and lateral lower leg continue to contract. Surface eschar. We have been using regular alginate. The area on the right medial lower leg is closed. We have been putting both legs under 4-layer contraction. The patient went back to see vein and vascular she had arterial studies done which were apparently "quite good" per the patient although I have not read their notes I have never felt she had an arterial issue. 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 This is been longstanding and refractory to exercise, leg elevation and longstanding use of compression wraps in our clinic as well as compression stockings on the times we have been able to get these to heal 4/21; we thought she actually might be close this week however she arrives in clinic with a lot of edema in her upper left calf and into her posterior thigh. This is been an intermittent problem here. She says the wrap fell down but it was replaced with a nurse visit on Monday. We are using calcium alginate to the wounds and the wound sizes there not terribly larger than last week but there is a lot more edema 4/28; again wound edges are smaller on both sides. Her edema is better controlled than last time. She is obtained her compression pumps from [employer] although they have not been to her home to set these up. 5/5; left medial and left lateral both look stable. I am not sure the medial is any smaller. We have been using calcium alginate under 4-layer compression. oShe had an area on the right medial. This was eschared today. We have been wrapping this as well. She does not tolerate external compression stockings due to a history of various contact allergies. She has her compression pumps however the representative from the company is coming on her to show her how to use these tomorrow 5/19; patient with severe chronic venous insufficiency secondary to central  venous disease. She had a stent placed in her left common iliac vein. She has done  better since but still difficult to control wounds. She comes in today with nothing open on the right leg. Her areas on the left medial and left lateral are just about closed. We are using calcium alginate under 4-layer compression. She is using her external compression pumps at home She only has 15-20 support stockings. States she cannot get anything tighter than that on. 03/30/20-Patient returns at 1 week, the wounds on the left leg are both slightly bigger, the last week she was on 3 layer compression which started to slide down. She is starting to use her lymphedema pumps although she stated on 1 day her right ankle started to swell up and she have to stop that day. Unfortunately the open area seem to oscillate between improving to the point of healing and then flaring up all to do with effectiveness of compression or lack of due to the left leg topography not keeping the compression wraps from rolling down 6/2 patient comes in with a 15/20 mmHg stocking on the right leg. She tells me that she developed a lot of swelling in her ankles she saw orthopedics she was felt to possibly be having a flare of pseudogout versus some other type of arthritis. She was put on steroids for a respiratory issue so that helps with the inflammation. She has not been using the pumps all week. She thinks the left thigh is more swollen than usual and I would agree with that. She has an appointment with Dr. Donzetta Matters 9 days or so from now 6/9; both wounds on the left medial and left lateral are smaller. We have been using calcium alginate under compression. She does not have an open wound on the right leg she is using a stocking and her compression pumps things are going well. She has an appointment with Dr. Donzetta Matters with regards to her stent in the left common iliac vein 6/16; the wounds on the left medial and left lateral ankle continues to  contract. The patient saw Dr. Donzetta Matters and I think he seems satisfied. Ordered follow-up venous reflux studies on both sides in September. Cautioned that she may need thigh-high stockings. She has been using calcium alginate under compression on the left and her own stocking on the right leg. She tells [place] there are no open wounds on the right 6/23; left lateral is just about closed. Medial required debridement today. We have been using calcium alginate. Extensive discussion about the compression pumps she is only using these on 25 mmHg states she could not take 40 or 30 when the wrap came out to her home to demonstrate these. He said they should not feel tight 6/30; the left lateral wound has a slight amount of eschar. . The area medially is about the same using Hydrofera Blue. 7/7; left lateral wound still has some eschar. I will remove this next week may be closed. The area medially is very small using Hydrofera Blue with improvement. Unfortunately the stockings fell down. Unfortunately the blisters have developed at the edge of where the wrap fell. When this happened she says her legs hurt she did not use her pumps. We are not open Monday for her to come in and change the wraps and she had an appointment yesterday. She also tells me that she is going to have an MRI of her back. She is having pain radiating into her left anterior leg she thinks her from an L5 disc. She saw Dr. Ellene Route of neurosurgery 7/14; the area on the left  lateral ankle area is closed. Still a small area medially however it looks better as well. We have been using Hydrofera Blue under 4-layer compression 7/21; left lateral ankle is still closed however her wound on the medial left calf is actually larger. This is probably because Hydrofera Blue got stuck to the wound. She came in for a nurse change on Friday and will do that again this week I was concerned about the amount of swelling that she had last week however she is using her  compression pumps twice a day and the swelling seems well controlled 7/28; remaining wound on the left medial lower leg is smaller. We have been using moistened silver collagen under compression she is coming back for a nurse visit. For reasons that were not really clear she was just keeping her legs elevated and not using her compression pumps. I have asked her to use the compression pumps. She does not have any wounds on the right leg 06/15/20-Patient returns at 2 weeks, her LLE edema is worse and she developed a blister wound that is new and has bigger posterior calf wound on right, we are using Prisma with pad, 4 layer compression. she has been on lasix 40 mg daily 8/18; patient arrives today with things a lot worse than I remember from a few weeks ago. She was seen last week. Noted that her edema was worse and that she now had a left lateral wound as well as deteriorating edema in the medial and posterior part of the lower leg. She says she is using his or her external compression pumps once a day although I wonder about the compliance. 8/25; weeping area on the right medial lower leg. This had actually gotten a small localized area of her compression stocking wet. oOn the left side there is a large denuded area on the posterior medial lower leg and smaller area on the lateral. This was not the original areas that we dealt with. 9/1 the patient's wound on the left leg include the left lateral and left posterior. Larger superficial wounds weeping. She has very poor edema control. Tender localized edema in the left lower medial ankle/heel probably because of localized wrap issues. She freely admits she is not using the compression pumps. She has been up on her feet a lot. She thinks the hydrofera blue is contributing to the pain she is experiencing.. This is a complaint that I have occasionally heard 9/8; really not much improvement. The patient is still complaining of a lot of pain particularly  when she uses compression pumps. I switched her to silver alginate last time because she found the Hydrofera Blue to be irritating. I don't hear much difference in her description with the silver alginate. 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 She has follow-up with Dr. Donzetta Matters I think for ultrasound next month [name], [name] 9/15; quite a bit of improvement today. We have less edema and more epithelialization in both of her wound areas on the left medial and left lateral calf. These are not the site of her original wounds in this area. She says she has been using her compression pumps for 30 minutes twice a day, there pain issues that never quite understood. Silver alginate as the primary dressing 9/22; continued improvement. Both areas medially and laterally still have a small open area there is some eschar. She continues to complain of left  medial ankle pain. Swelling in the leg is in much better condition. We have been using silver alginate 9/29; continued improvement. Both areas medially and laterally in the left calf look as though they are close some minor surface eschar but I think this is epithelialized. She comes in today saying she has a ruptured disc at L4-L5 cannot bend over to put on her stockings. 10/6; patient comes in today with no open wounds on either leg. However her edema on the left leg in the upper one third of the lower leg is poorly controlled nonpitting. She says that she could not use the pumps for 2 days and then she has been using the last couple of days. It is not clear to me she has been able to get her stocking on. She has back problems. Mrs. Stade has severe chronic venous insufficiency with secondary lymphedema. Her venous insufficiency is partially centrally mediated and that she is now post stent in the left common iliac vein. [facility] Thurner's syndrome/physiology]. She  follows up with them on 10/15. She wears 20/30 below-knee stockings. She is supposed to use compression pumps at home although I think her compliance about with this is been less than 100%. I have asked her to use these 3 times a day. Finally I think she has lipodermatosclerosis in the left lower leg with an inverted bottle sign. It is been a major problem controlling the edema in the left leg. The right leg we have had wounds on but not as significant a problem is on the left READMISSION 04/12/2021 Mrs. Nestor is a 75 year old woman we know well in this clinic. She has severe chronic venous insufficiency. 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 I believe she has had bilateral greater saphenous vein ablation in the past as well. She tells me that this wound opened sometime in March. She had a fall and thinks it was initially abrasion. She developed areas she describes as little blisters on the anterior part of her leg and she saw dermatology and was treated for methicillin staph aureus with several rounds of antibiotics. She has been using support stockings on the left leg and says this is the only thing she can get on. Her compression pump use maybe once a day she says if she did not use one she use the other. She comes in today with incredible swelling in the left leg with a wound on the left posterior calf. She has been using Neosporin to this previously a hydrocolloid. 6/15; patient arrives back for 1 week follow-up.Marland Kitchen Apparently her wrap fell down she did not call us to replace this. He has poor edema control. She only uses her compression pumps once a day 6/29; patient presents for 1 week follow-up. She has tolerated the compression wrap well. We have been using Iodoflex under the wrap. She has no issues or complaints today. 7/6; patient presents for 1 week follow-up. She states that the compression wrap rolled down her leg 4 days ago. She has been  trying to keep the area covered but has no dressings at home to use. She denies signs of infection. 7/13; patient presents for 1 week follow-up. She states that her compression wrap rolled down her right leg and she called our office and had it placed a few days ago. She has been tolerating the current wrap well. She states that the Iodoflex is causing a burning sensation. She denies signs of infection. 7/27;  patient presents for 1 week follow-up She has tolerated the compression wrap well with Hydrofera Blue underneath. She has now developed a wound to her right lower extremity. She reports having a culture done To this area by her dermatologist that she reports is negative. She currently denies signs of infection. Electronic Signature(s) Signed: 05/31/2021 3:06:46 PM By: Kalman Shan DO Entered By: Kalman Shan on 05/31/2021 14:53:28 Zullo, [name] -------------------------------------------------------------------------------- Physical Exam Details Patient Name: [name], [name]. Date of Service: 05/31/2021 1:30 PM Medical Record Number: [id number] Patient Account Number: [id number] Date of Birth/Sex: 1946-03-20 (75 y.o. F) Treating RN: Cornell Barman Primary Care Provider: [name] Other Clinician: Referring Provider: [name] Treating Provider/Extender: Yaakov Guthrie in Treatment: 7 Constitutional . Psychiatric . Notes Left lower extremity: To the posterior aspect there is an open wound with Granulation tissue and nonviable tissue present. No signs of infection Right lower extremity: Open wound limited to skin breakdown. No obvious signs of infection. 3+ pitting edema to the knee Electronic Signature(s) Signed: 05/31/2021 3:06:46 PM By: Kalman Shan DO Entered By: Kalman Shan on 05/31/2021 14:57:48 Beldin, [name] -------------------------------------------------------------------------------- Physician Orders  Details Patient Name: [name], [name]. Date of Service: 05/31/2021 1:30 PM Medical Record Number: [id number] Patient Account Number: [id number] Date of Birth/Sex: 1946/09/15 (75 y.o. F) Treating RN: Carlene Coria Primary Care Provider: [name] Other Clinician: Referring Provider: [name] Treating Provider/Extender: Yaakov Guthrie in Treatment: 7 Verbal / Phone Orders: No Diagnosis Coding Follow-up Appointments Wound #13 Left,Distal,Posterior Lower Leg o Return Appointment in 1 week. o Nurse Visit as needed Bathing/ Shower/ Hygiene o May shower with wound dressing protected with water repellent cover or cast protector. Anesthetic (Use 'Patient Medications' Section for Anesthetic Order Entry) o Lidocaine applied to wound bed Edema Control - Lymphedema / Segmental Compressive Device / Other o Elevate, Exercise Daily and Avoid Standing for Long Periods of Time. o Elevate legs to the level of the heart and pump ankles as often as possible o Elevate leg(s) parallel to the floor when sitting. o Compression Pump: Use compression pump on left lower extremity for 60 minutes, twice daily. - TWICE DAILY for 1 hour o Compression Pump: Use compression pump on right lower extremity for 60 minutes, twice daily. - TWICE DAILY for 1 hour Wound Treatment Wound #13 - Lower Leg Wound Laterality: Left, Posterior, Distal Cleanser: Soap and Water 1 x Per Week/30 Days Discharge Instructions: Gently cleanse wound with antibacterial soap, rinse and pat dry prior to dressing wounds Topical: Santyl Collagenase Ointment, 30 (gm), tube (Home Health) 1 x Per Week/30 Days Discharge Instructions: apply nickel thick to wound bed only Primary Dressing: Hydrofera Blue Ready Transfer Foam, 2.5x2.5 (in/in) (Generic) 1 x Per Week/30 Days Discharge Instructions: Apply Hydrofera Blue Ready to wound bed as directed Secondary Dressing: ABD Pad 5x9 (in/in) 1 x Per Week/30  Days Discharge Instructions: Cover with ABD pad Compression Wrap: Profore Lite LF 3 Multilayer Compression Bandaging System 1 x Per Week/30 Days Discharge Instructions: Apply 3 multi-layer wrap as prescribed. Wound #14 - Lower Leg Wound Laterality: Right Cleanser: Soap and Water 1 x Per Week/30 Days Discharge Instructions: Gently cleanse wound with antibacterial soap, rinse and pat dry prior to dressing wounds Primary Dressing: Silvercel Small 2x2 (in/in) 1 x Per Week/30 Days Discharge Instructions: Apply Silvercel Small 2x2 (in/in) as instructed Secondary Dressing: ABD Pad 5x9 (in/in) 1 x Per Week/30 Days Discharge Instructions: Cover with ABD pad Compression Wrap: Profore Lite LF 3 Multilayer Compression Bandaging  System 1 x Per Week/30 Days Discharge Instructions: Apply 3 multi-layer wrap as prescribed. Electronic Signature(s) Signed: 05/31/2021 3:06:46 PM By: Kalman Shan DO Signed: 06/02/2021 4:32:50 PM By: Carlene Coria RN [place], [place] ([id number]) Entered By: Carlene Coria on 05/31/2021 14:22:57 JEWELIA, MOROS ([id number]) -------------------------------------------------------------------------------- Problem List Details Patient Name: MALAUN, MACEDA. Date of Service: 05/31/2021 1:30 PM Medical Record Number: [id number] Patient Account Number: [id number] Date of Birth/Sex: 07-15-1946 (75 y.o. F) Treating RN: Cornell Barman Primary Care Provider: [name] Other Clinician: Referring Provider: [name] Treating Provider/Extender: Yaakov Guthrie in Treatment: 7 Active Problems ICD-10 Encounter Code Description Active Date MDM Diagnosis I87.332 Chronic venous hypertension (idiopathic) with ulcer and inflammation of 04/12/2021 No Yes left lower extremity I89.0 Lymphedema, not elsewhere classified 04/12/2021 No Yes L97.221 Non-pressure chronic ulcer of left calf limited to breakdown of skin 04/12/2021 No Yes S81.801A Unspecified open wound, right lower leg,  initial encounter 05/31/2021 No Yes Inactive Problems Resolved Problems Electronic Signature(s) Signed: 05/31/2021 3:06:46 PM By: Kalman Shan DO Entered By: Kalman Shan on 05/31/2021 14:29:29 Jahnke, [name] -------------------------------------------------------------------------------- Progress Note Details Patient Name: PORCHE, QUAGLIATA. Date of Service: 05/31/2021 1:30 PM Medical Record Number: [id number] Patient Account Number: [id number] Date of Birth/Sex: Apr 03, 1946 (75 y.o. F) Treating RN: Cornell Barman Primary Care Provider: [name] Other Clinician: Referring Provider: [name] Treating Provider/Extender: Yaakov Guthrie in Treatment: 7 Subjective Chief Complaint Information obtained from Patient Left calf venous stasis ulceration. 11/13/16; the patient is here for a left calf recurrent ulceration which is painful and has been present for the last month 01/22/17; the patient re-presents here for recurrent difficulties with blistering and leaking fluid on her left posterior calf 12/10/18; the patient returns to clinic for a wound on the left medial calf 04/12/2021; left lower extremity wound, 05/31/21; right leg wound History of Present Illness (HPI) Pleasant 75 year old with history of chronic venous insufficiency. No diabetes or peripheral vascular disease. Left ABI 1.29. Questionable history of left lower extremity DVT. She developed a recurrent ulceration on her left lateral calf in December 2015, which she attributes to poor diet and subsequent lower extremity edema. She underwent endovenous laser ablation of her left greater saphenous vein in 2010. She underwent laser ablation of accessory branch of left GSV in April 2016 by Dr. Kellie Simmering at [facility]. She was previously wearing Unna boots, which she tolerated well. Tolerating 2 layer compression and cadexomer iodine. She returns to clinic for follow-up and is without new complaints. She  denies any significant pain at this time. She reports persistent pain with pressure. No claudication or ischemic rest pain. No fever or chills. No drainage. READMISSION 11/13/16; this is a 75 year old woman who is not a diabetic. She is here for a review of a painful area on her left medial lower extremity. I note that she was seen here previously last year for wound I believe to be in the same area. At that time she had undergone previously a left greater saphenous vein ablation by Dr. Kellie Simmering and she had a ablation of the anterior accessory branch of the left greater saphenous vein in March 2016. Seeing that the wound actually closed over. In reviewing the history with her today the ulcer in this area has been recurrent. She describes a biopsy of this area in 2009 that only showed stasis physiology. 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 Do not see that she has ever had arterial studies in the left leg. She tells me she has a follow-up with Dr. Kellie Simmering in roughly 10 days In any case she developed the reopening of this area roughly a month ago. On the background of this she describes rapidly increasing edema which has responded to Lasix 40 mg and metolazone 2.5 mg as well as the patient's lymph massage. She has been told she has both venous insufficiency and lymphedema but she cannot tolerate compression stockings 11/28/16; the patient saw Dr. Kellie Simmering recently. Per the patient he did arterial Dopplers in the office that did not show evidence of arterial insufficiency, per the patient he stated "treat this like an ordinary venous ulcer". She also saw her dermatologist Dr. Ronnald Ramp who felt that this was more of a vascular ulcer. In general things are improving although she arrives today  with increasing bilateral lower extremity edema with weeping a deeper fluid through the wound on the left medial leg compatible with some degree of lymphedema 12/04/16; the patient's wound is fully epithelialized but I don't think fully healed. We will do another week of depression with Promogran and TCA however I suspect we'll be able to discharge her next week. This is a very unusual-looking wound which was initially a figure-of-eight type wound lying on its side surrounded by petechial like hemorrhage. She has had venous ablation on this side. She apparently does not have an arterial issue per Dr. Kellie Simmering. She saw her dermatologist thought it was "vascular". Patient is definitely going to need ongoing compression and I talked about this with her today she will go to elastic therapy after she leaves here next week 12/11/16; the patient's wound is not completely closed today. She has surrounding scar tissue and in further discussion with the patient it would appear that she had ulcers in this area in 2009 for a prolonged period of time ultimately requiring a punch biopsy of this area that only showed venous insufficiency. I did not previously pickup on this part of the history from the patient. 12/18/16; the patient's wound is completely epithelialized. There is no open area here. She has significant bilateral venous insufficiency with secondary lymphedema to a mild-to-moderate degree she does not have compression stockings.. She did not say anything to me when I was in the room, she told our intake nurse that she was still having pain in this area. This isn't unusual recurrent small open area. She is going to go to elastic therapy to obtain compression stockings. 12/25/16; the patient's wound is fully epithelialized. There is no open area here. The patient describes some continued episodic discomfort in this area medial left calf. However everything looks fine and healed here. She is been to elastic  therapy and caught herself 15-20 mmHg stockings, they apparently were having trouble getting 20-30 mm stockings in her size 01/22/17; this is a patient we discharged from the clinic a month ago. She has a recurrent open wound on her medial left calf. She had 15 mm support stockings. I told her I thought she needed 20-30 mm compression stockings. She tells me that she has been ill with hospitalization secondary to asthma and is been found to have severe hypokalemia likely secondary to a combination of Lasix and metolazone. This morning she noted blistering and leaking fluid on the posterior part of her left leg. She called  our intake nurse urgently and we was saw her this afternoon. She has not had any real discomfort here. I don't know that she's been wearing any stockings on this leg for at least 2-3 days. ABIs in this clinic were 1.21 on the right and 1.3 on the left. She is previously seen vascular surgery who does not think that there is a peripheral arterial issue. 01/30/17; Patient arrives with no open wound on the left leg. She has been to elastic therapy and obtained 20-56mhg below knee stockings and she has one on the right leg today. READMISSION 02/19/18; this PBrammeris a now 75year old patient we've had in this clinic perhaps 3 times before. I had last looked at her from January 07 December 2016 with an area on the medial left leg. We discharged her on 12/25/16 however she had to be readmitted on 01/22/17 with a recurrence. I have in my notes that we discharged her on 20-30 mm stockings although she tells me she was only wearing support hose because she cannot get stockings on predominantly related to her cervical spine surgery/issues. [place] the left with an anterior accessory branch ablation I think both of these were in 2016. On one of the previous visit she had a biopsy noted 2009  that was negative. She is not felt to have an arterial issue. She is not a diabetic. She does have a PCHARMELL, ARAIZA ([id number] history of obstructive sleep apnea hypertension asthma as well as chronic venous insufficiency and lymphedema. On this occasion she noted 2 dry scaly patch on her left leg. She tried to put lotion on this it didn't really help. There were 2 open areas.the patient has been seeing her primary physician from 02/05/18 through 02/14/18. She had Unna boots applied. The superior wound now on the lateral left leg has closed but she's had one wound that remains open on the lateral left leg. This is not the same spot as we dealt with in 2018. ABIs in this clinic were 1.3 bilaterally 02/26/18; patient has a small wound on the left lateral calf. Dimensions are down. She has chronic venous insufficiency and lymphedema. 03/05/18; small open area on the left lateral calf. Dimensions are down. Tightly adherent necrotic debris over the surface of the wound which was difficult to remove. Also the dressing [over collagen] stuck to the wound surface. This was removed with some difficulty as well. Change the primary dressing to Hydrofera Blue ready 03/12/18; small open area on the left lateral calf. Comes in with tightly adherent surface eschar as well as some adherent Hydrofera Blue. 03/19/18; open area on the left lateral calf. Again adherent surface eschar as well as some adherent Hydrofera Blue nonviable subcutaneous tissue. She complained of pain all week even with the reduction from 4-3 layer compression I put on last week. Also she had an increase in her ankle and calf measurements probably related to the same thing. 03/26/18; open area on the left lateral calf. A very small open area remains here. We used silver alginate starting last week as the Hydrofera Blue seem to stick to the wound bed. In using 4-layer compression 04/02/18; the open area in the left lateral calf at some adherent slough  which I removed there is no open area here. We are able to transition her into her own compression stocking. Truthfully I think this is probably his support hose. However  this does not maintain skin integrity will be limited. She cannot put over the toe compression stockings on because of neck problems hand problems etc. She is allergic to the lining layer of juxta lites. We might be forced to use extremitease stocking should this fail READMIT 11/24/2018 Patient is now a 75 year old woman who is not a diabetic. She has been in this clinic on at least 3 previous occasions largely with recurrent wounds on her left leg secondary to chronic venous insufficiency with secondary lymphedema. Her situation is complicated by inability to get stockings on and an allergy to neoprene which is apparently a component and at least juxta lites and other stockings. As a result she really has not been wearing any stockings on her legs. She tells [place] that roughly 2 or 3 weeks ago she started noticing a stinging sensation just above her ankle on the left medial aspect. She has been diagnosed with pseudogout and she wondered whether this was what she was experiencing. She tried to dress this with something she bought at the store however subsequently it pulled skin off and now she has an open wound that is not improving. She has been using Vaseline gauze with a cover bandage. She saw her primary doctor last week who put an Haematologist on her. ABIs in this clinic was 1.03 on the left 2/12; the area is on the left medial ankle. Odd-looking wound with what looks to be surface epithelialization but a multitude of small petechial openings. This clearly not closed yet. We have been using silver alginate under 3 layer compression with TCA 2/19; the wound area did not look quite as good this week. Necrotic debris over the majority of the wound surface which required debridement. She continues to have a multitude of what looked to  be small petechial openings. She reminds [place] that she had a biopsy on this initially during her first outbreak in 2015 in [place] dermat[facility]. She expresses concern about this being a possible melanoma. 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 I assured her that this does not look anything like melanoma. Except for the petechial reaction it does look like a venous insufficiency area and she certainly has evidence of this on both sides 2/26; a difficult area on the left medial ankle. The patient clearly has chronic venous hypertension with some degree of lymphedema. The odd thing about the area is the small petechial hemorrhages. I am not really sure how to explain this. This was present last time and this is not a compression injury. We have been using Hydrofera Blue which I changed to last week 3/4; still using Hydrofera Blue. Aggressive debridement today. She does not have known arterial issues. She has seen Dr. Kellie Simmering at [facility] vein [facility] vascular and and has an ablation on the left. [Anterior accessory branch of the greater saphenous]. From what I remember they did not feel she had an arterial issue. The patient has had this area biopsied in 2009 at [facility] dermatology and by her recollection they said this was "stasis". She is also follow-up with dermatology locally who thought that this was more of a vascular issue 3/11; using Hydrofera Blue. Aggressive debridement today. She does not have an arterial issue. We are using 3 layer compression although we may need to go to 4. The patient has been in for multiple changes to her wrap since I last saw her a week ago.  She says that the area was leaking. I do not have too much more information on what was found 01/19/19 on evaluation today patient was actually being seen for a nurse visit when unfortunately she had the area on her left lateral lower extremity  as well as weeping from the right lower extremity that became apparent. Therefore we did end up actually seeing her for a full visit with myself. She is having some pain at this site as well but fortunately nothing too significant at this point. No fevers, chills, nausea, or vomiting noted at this time. 3/18-Patient is back to the clinic with the left leg venous leg ulcer, the ulcer is larger in size, has a surface that is densely adherent with fibrinous tissue, the Hydrofera Blue was used but is densely adherent and there was difficulty in removing it. The right lower extremity was also wrapped for weeping edema. Patient has a new area over the left lateral foot above the malleolus that is small and appears to have no debris with intact surrounding skin. Patient is on increased dose of Lasix also as a means to edema management 3/25; the patient has a nonhealing venous ulcer on the medial left leg and last week developed a smaller area on the lateral left calf. We have been using Hydrofera Blue with a contact layer. 4/1; no major change in these wounds areas. Left medial and more recently left lateral calf. I tried Iodoflex last week to aid in debridement she did not tolerate this. She stated her pain was terrible all week. She took the top layer of the 4 layer compression off. 4/8; the patient actually looks somewhat better in terms of her more prominent left lateral calf wound. There is some healthy looking tissue here. She is still complaining of a lot of discomfort. 4/15; patient in a lot of pain secondary to sciatica. She is on a prednisone taper prescribed by her primary physician. She has the 2 areas one on the left medial and more recently a smaller area on the left lateral calf. Both of these just above the malleoli 4/22; her back pain is better but she still states she is very uncomfortable and now feels she is intolerant to the [employer]. No real change in the wounds we have been using  Sorbact. She has been previously intolerant to Iodoflex. There is not a lot of option about what we can use to debride this wound under compression that she no doubt needs. sHe states Ultram no longer works for her pain 4/29; no major change in the wounds slightly increased depth. Surface on the original medial wound perhaps somewhat improved however the more recent area on the lateral left ankle is 100% covered in very adherent debris we have been using Sorbact. She tolerates 4 layer compression well and her edema control is a lot better. She has not had to come in for a nurse check 5/6; no major change in the condition of the wounds. She did consent to debridement today which was done with some difficulty. Continuing Sorbact. She did not tolerate Iodoflex. She was in for a check of her compression the day after we wrapped her last week this was adjusted but MARVIS, GATON. ([id number]) nothing much was found 5/13; no major change in the condition or area of the wounds. I was able to get a fairly aggressive debridement done on the lateral left leg wound. Even using Sorbact under compression. She came back in on Friday to have  the wrap changed. She says she felt uncomfortable on the lateral aspect of her ankle. She has a long history of chronic venous insufficiency including previous ablation surgery on this side. 5/20-Patient returns for wounds on left leg with both wounds covered in slough, with the lateral leg wound larger in size, she has been in 3 layer compression and felt more comfortable, she describes pain in ankle, in leg and pins and needles in foot, and is about to try Pamelor for this 6/3; wounds on the left lateral and left medial leg. The area medially which is the most recent of the 2 seems to have had the largest increase in dimensions. We have been using Sorbac to try and debride the surface. She has been to see orthopedics they apparently did a plain x-ray that was indeterminant.  Diagnosed her with neuropathy and they have ordered an MRI to determine if there is underlying osteomyelitis. This was not high on my thought list but I suppose it is prudent. We have advised her to make an appointment with vein and vascular in [place]. She has a history of a left greater saphenous and accessory vein ablations I wonder if there is anything else that can be done from a surgical point of view to help in these difficult refractory wounds. We have previously healed this wound on one occasion but it keeps on reopening [medial side] 6/10; deep tissue culture I did last week I think on the left medial wound showed both moderate E. coli and moderate staph aureus [MSSA]. She is going to require antibiotics and I have chosen Augmentin. We have been using Sorbact and we have made better looking wound surface on both sides but certainly no improvement in wound area. She was back in last Friday apparently for a dressing changes the wrap was hurting her outer left ankle. She has not managed to get a hold of vein and vascular in [place]. We are going to have to make her that appointment 6/17; patient is tolerating the Augmentin. She had an MRI that I think was ordered by orthopedic surgeon this did not show osteomyelitis or an abscess did suggest cellulitis. We have been using Sorbact to the lateral and medial ankles. We have been trying to arrange a follow-up appointment with vein and vascular in [place] or did her original ablations. We apparently an area sent the request to vein and vascular in [facility] 6/24; patient has completed the Augmentin. We do not yet have a vein and vascular appointment in [place]. I am not sure what the issue is here we have asked her to call tomorrow. We are using Sorbact. Making some improvements and especially the medial wound. Both surfaces however look better medial and lateral. 7/1; the patient has been in contact with vein and vascular in [place]  but has not yet received an appointment. Using Sorbact we have gradually improve the wound surface with no improvement in surface area. She is approved for Apligraf but the wound surface still is not completely viable. She has not had to come in for a dressing change 7/8; the patient has an appointment with vein and vascular on 7/31 which is a Friday afternoon. She is concerned about getting back here for [place] to dress her wounds. I think it is important to have them goal for her venous reflux/history of ablations etc. to see if anything else can be done. She apparently tested positive for 1 of the blood tests with regards to lupus and saw a  rheumatologist. He has raised the issue of vasculitis again. I have had this thought in the past however the evidence seems overwhelming that this is a venous reflux etiology. If the rheumatologist tells me there is clinical and laboratory investigation is positive for lupus I will rethink this. 7/15; the patient's wound surfaces are quite a bit better. The medial area which was her original wound now has no depth although the lateral wound which was the more recent area actually appears larger. Both with viable surfaces which is indeed better. Using Sorbact. I wanted to use Apligraf on her however there is the issue of the vein and vascular appointment on 7/31 at 2:00 in the afternoon which would not allow her to get back to be rewrapped and they would no doubt remove the graft 7/22; the patient's wound surfaces have moderate amount of debris although generally look better. The lateral one is larger with 2 small satellite areas superiorly. We are waiting for her vein and vascular appointment on 7/31. She has been approved for Apligraf which I would like to use after th 7/29; wound surfaces have improved no debridement is required we have been using Sorbact. She sees vein and vascular on Friday with this so question of whether anything can be done to lessen the  likelihood of recurrence and/or speed the healing of these areas. She is already had previous ablations. She no doubt has severe venous hypertension 8/5-Patient returns at 1 week, she was in [place] for 3 days by her podiatrist, we have been using so backed to the wound, she has increased pain in both the wounds on the left lower leg especially the more distal one on the lateral aspect 8/12-Patient returns at 1 week and she is agreeable to having debridement in both wounds on her left leg today. We have been using Sorbact, and vascular studies were reviewed at last visit 8/19; the patient arrives with her wounds fairly clean and no debridement is required. We have used Sorbact which is really done a nice job in cleaning up these very difficult wound surfaces. The patient saw Dr. Donzetta Matters of vascular surgery on 7/31. He did not feel that there was an arterial component. He felt that her treated greater saphenous vein is adequately addressed and that the small saphenous vein did not appear to be involved significantly. She was also noted to have deep venous reflux which is not treatable. Dr. Donzetta Matters mentioned the possibility of a central obstructive component leading to reflux and he offered her central venography. She wanted to discuss this or think about it. I have urged her to go ahead with this. She has had recurrent difficult wounds in these areas which do heal but after months in the clinic. If there is anything that can be done to reduce the likelihood of this I think it is worth it. 9/2 she is still working towards getting follow-up with Dr. Donzetta Matters to schedule her CT. Things are quite a bit worse venography. I put Apligraf on 2 weeks ago on both wounds on the medial and lateral part of her left lower leg. She arrives in clinic today with 3 superficial additional wounds above the area laterally and one below the wound medially. She describes a lot of discomfort. I think these are probably wrapped  injuries. Does not look like she has cellulitis. 07/20/2019 on evaluation today patient appears to be doing somewhat poorly in regard to her lower extremity ulcers. She in fact showed signs of erythema in  fact we may even be dealing with an infection at this time. Unfortunately I am unsure if this is just infection or if indeed there may be some allergic reaction that occurred as a result of the Apligraf application. With that being said that would be unusual but nonetheless not impossible in this patient is one who is unfortunately allergic to quite a bit. Currently we have been using the Sorbact which seems to do as well as anything for her. I do think we may want to obtain a culture today to see if there is anything showing up there that may need to be addressed. 9/16; noted that last week the wounds look worse in 1 week follow-up of the Apligraf. Using Sorbact as of 2 days ago. She arrives with copious amounts of drainage and new skin breakdown on the back of the left calf. The wounds arm more substantial bilaterally. There is a fair amount of swelling in the left calf no overt DVT there is edema present I think in the left greater than right thigh. She is supposed to go on 9/28 for CT venography. The wounds on the medial and lateral calf are worse and she has new skin breakdown posteriorly at least new for me. This is almost developing into a circumferential wound area The Apligraf was taken off last week which I agree with things are not going in the right direction a culture was done we do not have that back yet. She is on Augmentin that she started 2 days ago 9/23; dressing was changed by her nurses on Monday. In general there is no improvement in the wound areas although the area looks less angry than last week. She did get Augmentin for MSSA cultured on the 14th. She still appears to have too much swelling in the left leg even with 3 layer compression 9/30; the patient underwent her  procedure on 9/28 by Dr. Donzetta Matters at vascular and vein specialist. She was discovered to have the common iliac vein measuring 12.2 mm but at the level of L4-L5 measured 3 mm. After stenting it measured 10 mm. 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 Rouleaux flow in the common femoral and femoral vein was observed much improved after stenting. We are using silver alginate to the wounds on the medial and lateral ankle on the left. 4 layer compression SAQUANA, MARUSKA ([id number]) 10/7; the patient had fluid swelling around her knee and 4 layer compression. At the advice of vein and vascular this was reduced to 3 layer which she is tolerating better. We have been using silver alginate under 3 layer compression since last Friday 10/14; arrives with the areas on the left ankle looking a lot better. Inflammation in the area also a lot better. She came in for a nurse check on 10/9 10/21; continued nice improvement. Slight improvements in surface area of both the medial and lateral wounds on the left. A lot of the satellite lesions in the weeping erythema around these from stasis dermatitis is resolved. We have been using silver alginate 10/28; general improvement in the entire wound areas although not a lot of change in dimensions the wound certainly looks better. There is a lot less in terms of venous inflammation. Continue silver alginate this week however look towards Hydrofera Blue next week 11/4; very adherent debris on the medial wound left wound is not as bad. We have been using silver alginate. Change to [facility] today 11/11; very adherent debris on both  wound areas. She went to vein and vascular last week and follow-up they put in [place] boot on this today. He says the [facility] was adherent. Wound is definitely not as good as last week. Especially on the left there the satellite lesions look more prominent 11/18; absolutely no better. erythema on lateral aspect with  tenderness. 09/30/2019 on evaluation today patient appears to actually be doing better. Dr. Dellia Nims did put her on doxycycline last week which I do believe has helped her at this point. Fortunately there is no signs of active infection at this time. No fevers, chills, nausea, vomiting, or diarrhea. I do believe he may want extend the doxycycline for 7 additional days just to ensure everything does completely cleared up the patient is in agreement with that plan. Otherwise she is going require some sharp debridement today 12/2; patient is completing a 2-week course of doxycycline. I gave her this empirically for inflammation as well as infection when I last saw her 2 weeks ago. All of this seems to be better. She is using silver alginate she has the area on the medial aspect of the larger area laterally and the 2 small satellite regions laterally above the major wound. 12/9; the patient's wound on the left medial and left lateral calf look really quite good. We have been using silver alginate. She saw vein and vascular in follow-up on 10/09/2019. She has had a previous left greater saphenous vein ablation by Dr. Oscar La in 2016. More recently she underwent a left common iliac vein stent by Dr. 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 The swelling is improved and certainly the wounds have improved. The patient shows [place] today area on the right medial calf there is almost no wound but leaking lymphedema. She says she start this started 3 or 4 days ago. She did not traumatize it. It is not painful. She does not wear compression on that side 12/16; the patient continues to do well laterally. Medially still requiring debridement. The area on the right calf did not materialize to anything and is not currently open. We wrapped this last time. She has support stockings for that leg although I am not sure they are going to provide adequate compression 12/23; the lateral wound looks stable. Medially  still requiring debridement for tightly adherent fibrinous debris. We've been using silver alginate. Surface area not any different 12/30; neither wound is any better with regards to surface and the area on the left lateral is larger. I been using silver alginate to the left lateral which look quite good last week and Sorbact to the left medial 11/11/2019. Lateral wound area actually looks better and somewhat smaller. Medial still requires a very aggressive debridement today. We have been using Sorbact on both wound areas 1/13; not much better still adherent debris bilaterally. I been using Sorbact. She has severe venous hypertension. Probably some degree of dermal fibrosis distally. I wonder whether tighter compression might help and I am going to try that today. We also need to work on the bioburden 1/20; using Sorbact. She has severe venous hypertension status post stent placement for pelvic vein compression. We applied gentamicin last time to see if we could reduce bioburden I had some discussion with her today about the use of pentoxifylline. This is occasionally used in this setting for wounds with refractory venous insufficiency. However this interacts with Plavix. She tells me that she was put on this after stent placement for 3 months. She will call  Dr. Claretha Cooper office to discuss 1/27; we are using gentamicin under Sorbact. 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 She has a stent. Wound medially is measuring smaller this week. Laterally measuring slightly larger although she has some satellite lesions superiorly 2/3; gentamicin under Sorbact under 4-layer compression. 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 She has a stent on Plavix. Her wounds are measuring smaller this week. More substantially laterally where there is a satellite lesion superiorly. 2/10; gentamicin under Sorbac. 4-layer compression. Patient communicated with Dr. Donzetta Matters  at vein and vascular in [place]. He is okay with the patient coming off Plavix I will therefore start her on pentoxifylline for a 1 month trial. In general her wounds look better today. I had some concerns about swelling in the left thigh however she measures 61.5 on the right and 63 on the mid thigh which does not suggest there is any difficulty. The patient is not describing any pain. 2/17; gentamicin under Sorbac 4-layer compression. She has been on pentoxifylline for 1 week and complains of loose stool. No nausea she is eating and drinking well 2/24; the patient apparently came in 2 days ago for a nurse visit when her wrap fell down. Both areas look a little worse this week macerated medially and satellite lesions laterally. Change to silver alginate today 3/3; wounds are larger today especially medially. She also has more swelling in her foot lower leg and I even noted some swelling in her posterior thigh which is tender. I wonder about the patency of her stent. Fortuitously she sees Dr. Claretha Cooper group on Friday 3/10; Mrs. Bero was seen by vein and vascular on 3/5. The patient underwent ultrasound. There was no evidence of thrombosis involving the IVC no evidence of thrombosis involving the right common iliac vein there is no evidence of thrombosis involving the right external iliac vein the left external vein is also patent. The right common iliac vein stent appears patent bilateral common femoral veins are compressible and appear patent. I was concerned about the left common iliac stent however it looks like this is functional. She has some edema in the posterior thigh that was tender she still has that this week. I also note they had trouble finding the pulses in her left foot and booked her for an ABI baseline in 4 weeks. She will follow up in 6 months for repeat IVC duplex. The patient stopped the pentoxifylline because of diarrhea. It does not look like that was being effective in any  case. I have advised her to go back on her aspirin 81 mg tablet, vascular it also suggested this 3/17; comes in today with her wound surfaces a lot better. The excoriations from last week considerably better probably secondary to the TCA. We have been using silver alginate 3/24; comes in today with smaller wounds both medially and laterally. Both required debridement. There are 2 small satellite areas superiorly laterally. She also has a very odd bandlike area in the mid calf almost looking like there was a weakness in the wrap in a localized area. I would write this off as being this however anteriorly she has a small raised ballotable area that is very tender almost reminiscent of an abscess but there was no [name], [name]. ([id number]) obvious purulent surface to it. 02/04/20 upon evaluation today patient appears to be doing fairly well in regard to her wounds today. Fortunately there is no signs of active infection at this time. No fevers, chills, nausea,  vomiting, or diarrhea. She has been tolerating the dressing changes without complication. Fortunately I feel like she is showing signs of improvement although has been sometime since have seen her. Nonetheless the area of concern that Dr. Dellia Nims had last week where she had possibly an area of the wrap that was we can allow the leg to bulge appears to be doing significantly better today there is no signs of anything worsening. 4/7; the patient's wounds on her medial and lateral left leg continue to contract. We have been using a regular alginate. Last week she developed an area on the right medial lower leg which is probably a venous ulcer as well. 4/14; the wounds on her left medial and lateral lower leg continue to contract. Surface eschar. We have been using regular alginate. The area on the right medial lower leg is closed. We have been putting both legs under 4-layer contraction. The patient went back to see vein and vascular she had  arterial studies done which were apparently "quite good" per the patient although I have not read their notes I have never felt she had an arterial issue. 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 This is been longstanding and refractory to exercise, leg elevation and longstanding use of compression wraps in our clinic as well as compression stockings on the times we have been able to get these to heal 4/21; we thought she actually might be close this week however she arrives in clinic with a lot of edema in her upper left calf and into her posterior thigh. This is been an intermittent problem here. She says the wrap fell down but it was replaced with a nurse visit on Monday. We are using calcium alginate to the wounds and the wound sizes there not terribly larger than last week but there is a lot more edema 4/28; again wound edges are smaller on both sides. Her edema is better controlled than last time. She is obtained her compression pumps from [employer] although they have not been to her home to set these up. 5/5; left medial and left lateral both look stable. I am not sure the medial is any smaller. We have been using calcium alginate under 4-layer compression. She had an area on the right medial. This was eschared today. We have been wrapping this as well. She does not tolerate external compression stockings due to a history of various contact allergies. She has her compression pumps however the representative from the company is coming on her to show her how to use these tomorrow 5/19; patient with severe chronic venous insufficiency secondary to central venous disease. She had a stent placed in her left common iliac vein. She has done better since but still difficult to control wounds. She comes in today with nothing open on the right leg. Her areas on the left medial and left lateral are just about closed. We are using calcium alginate  under 4-layer compression. She is using her external compression pumps at home She only has 15-20 support stockings. States she cannot get anything tighter than that on. 03/30/20-Patient returns at 1 week, the wounds on the left leg are both slightly bigger, the last week she was on 3 layer compression which started to slide down. She is starting to use her lymphedema pumps although she stated on 1 day her right ankle started to swell up and she have to stop that day. Unfortunately the open area seem to oscillate between improving to  the point of healing and then flaring up all to do with effectiveness of compression or lack of due to the left leg topography not keeping the compression wraps from rolling down 6/2 patient comes in with a 15/20 mmHg stocking on the right leg. She tells me that she developed a lot of swelling in her ankles she saw orthopedics she was felt to possibly be having a flare of pseudogout versus some other type of arthritis. She was put on steroids for a respiratory issue so that helps with the inflammation. She has not been using the pumps all week. She thinks the left thigh is more swollen than usual and I would agree with that. She has an appointment with Dr. Donzetta Matters 9 days or so from now 6/9; both wounds on the left medial and left lateral are smaller. We have been using calcium alginate under compression. She does not have an open wound on the right leg she is using a stocking and her compression pumps things are going well. She has an appointment with Dr. Donzetta Matters with regards to her stent in the left common iliac vein 6/16; the wounds on the left medial and left lateral ankle continues to contract. The patient saw Dr. Donzetta Matters and I think he seems satisfied. Ordered follow-up venous reflux studies on both sides in September. Cautioned that she may need thigh-high stockings. She has been using calcium alginate under compression on the left and her own stocking on the right leg. She  tells [place] there are no open wounds on the right 6/23; left lateral is just about closed. Medial required debridement today. We have been using calcium alginate. Extensive discussion about the compression pumps she is only using these on 25 mmHg states she could not take 40 or 30 when the wrap came out to her home to demonstrate these. He said they should not feel tight 6/30; the left lateral wound has a slight amount of eschar. . The area medially is about the same using Hydrofera Blue. 7/7; left lateral wound still has some eschar. I will remove this next week may be closed. The area medially is very small using Hydrofera Blue with improvement. Unfortunately the stockings fell down. Unfortunately the blisters have developed at the edge of where the wrap fell. When this happened she says her legs hurt she did not use her pumps. We are not open Monday for her to come in and change the wraps and she had an appointment yesterday. She also tells me that she is going to have an MRI of her back. She is having pain radiating into her left anterior leg she thinks her from an L5 disc. She saw Dr. Ellene Route of neurosurgery 7/14; the area on the left lateral ankle area is closed. Still a small area medially however it looks better as well. We have been using Hydrofera Blue under 4-layer compression 7/21; left lateral ankle is still closed however her wound on the medial left calf is actually larger. This is probably because Hydrofera Blue got stuck to the wound. She came in for a nurse change on Friday and will do that again this week I was concerned about the amount of swelling that she had last week however she is using her compression pumps twice a day and the swelling seems well controlled 7/28; remaining wound on the left medial lower leg is smaller. We have been using moistened silver collagen under compression she is coming back for a nurse visit. For reasons that were  not really clear she was just keeping  her legs elevated and not using her compression pumps. I have asked her to use the compression pumps. She does not have any wounds on the right leg 06/15/20-Patient returns at 2 weeks, her LLE edema is worse and she developed a blister wound that is new and has bigger posterior calf wound on right, we are using Prisma with pad, 4 layer compression. she has been on lasix 40 mg daily 8/18; patient arrives today with things a lot worse than I remember from a few weeks ago. She was seen last week. Noted that her edema was worse and that she now had a left lateral wound as well as deteriorating edema in the medial and posterior part of the lower leg. She says she is using his or her external compression pumps once a day although I wonder about the compliance. 8/25; weeping area on the right medial lower leg. This had actually gotten a small localized area of her compression stocking wet. On the left side there is a large denuded area on the posterior medial lower leg and smaller area on the lateral. This was not the original areas that we dealt with. 9/1 the patient's wound on the left leg include the left lateral and left posterior. Larger superficial wounds weeping. She has very poor edema control. Tender localized edema in the left lower medial ankle/heel probably because of localized wrap issues. She freely admits she is not using [place], [place] ([id number]) the compression pumps. She has been up on her feet a lot. She thinks the hydrofera blue is contributing to the pain she is experiencing.. This is a complaint that I have occasionally heard 9/8; really not much improvement. The patient is still complaining of a lot of pain particularly when she uses compression pumps. I switched her to silver alginate last time because she found the Hydrofera Blue to be irritating. I don't hear much difference in her description with the silver alginate. 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 She has follow-up with Dr. Donzetta Matters I think for ultrasound next month 9/15; quite a bit of improvement today. We have less edema and more epithelialization in both of her wound areas on the left medial and left lateral calf. These are not the site of her original wounds in this area. She says she has been using her compression pumps for 30 minutes twice a day, there pain issues that never quite understood. Silver alginate as the primary dressing 9/22; continued improvement. Both areas medially and laterally still have a small open area there is some eschar. She continues to complain of left medial ankle pain. Swelling in the leg is in much better condition. We have been using silver alginate 9/29; continued improvement. Both areas medially and laterally in the left calf look as though they are close some minor surface eschar but I think this is epithelialized. She comes in today saying she has a ruptured disc at L4-L5 cannot bend over to put on her stockings. 10/6; patient comes in today with no open wounds on either leg. However her edema on the left leg in the upper one third of the lower leg is poorly controlled nonpitting. She says that she could not use the pumps for 2 days and then she has been using the last couple of days. It is not clear to me she has been able  to get her stocking on. She has back problems. Mrs. Suitter has severe chronic venous insufficiency with secondary lymphedema. Her venous insufficiency is partially centrally mediated and that she is now post stent in the left common iliac vein. [facility] Thurner's syndrome/physiology]. She follows up with them on 10/15. She wears 20/30 below-knee stockings. She is supposed to use compression pumps at home although I think her compliance about with this is been less than 100%. I have asked her to use these 3 times a day. Finally I think she has lipodermatosclerosis in the left  lower leg with an inverted bottle sign. It is been a major problem controlling the edema in the left leg. The right leg we have had wounds on but not as significant a problem is on the left READMISSION 04/12/2021 Mrs. Fenton is a 75 year old woman we know well in this clinic. She has severe chronic venous insufficiency. 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 I believe she has had bilateral greater saphenous vein ablation in the past as well. She tells me that this wound opened sometime in March. She had a fall and thinks it was initially abrasion. She developed areas she describes as little blisters on the anterior part of her leg and she saw dermatology and was treated for methicillin staph aureus with several rounds of antibiotics. She has been using support stockings on the left leg and says this is the only thing she can get on. Her compression pump use maybe once a day she says if she did not use one she use the other. She comes in today with incredible swelling in the left leg with a wound on the left posterior calf. She has been using Neosporin to this previously a hydrocolloid. 6/15; patient arrives back for 1 week follow-up.Marland Kitchen Apparently her wrap fell down she did not call us to replace this. He has poor edema control. She only uses her compression pumps once a day 6/29; patient presents for 1 week follow-up. She has tolerated the compression wrap well. We have been using Iodoflex under the wrap. She has no issues or complaints today. 7/6; patient presents for 1 week follow-up. She states that the compression wrap rolled down her leg 4 days ago. She has been trying to keep the area covered but has no dressings at home to use. She denies signs of infection. 7/13; patient presents for 1 week follow-up. She states that her compression wrap rolled down her right leg and she called our office and had it placed a few days ago. She has been tolerating the  current wrap well. She states that the Iodoflex is causing a burning sensation. She denies signs of infection. 7/27; patient presents for 1 week follow-up She has tolerated the compression wrap well with Hydrofera Blue underneath. She has now developed a wound to her right lower extremity. She reports having a culture done To this area by her dermatologist that she reports is negative. She currently denies signs of infection. Patient History Information obtained from Patient. Family History Cancer - Mother,Paternal Grandparents, Hypertension - Mother,Father, Kidney Disease - Paternal Grandparents, Lung Disease - Paternal Grandparents, No family history of Diabetes, Heart Disease, Hereditary Spherocytosis, Seizures, Stroke, Thyroid Problems, Tuberculosis. Social History Former smoker - quit at age 52 - ended on 04/19/1966, Marital Status - Divorced, Alcohol Use - Never, Drug Use - No History, Caffeine Use - Daily. Medical History Eyes Patient has history of Cataracts Denies history of  Glaucoma, Optic Neuritis Ear/Nose/Mouth/Throat Denies history of Chronic sinus problems/congestion, Middle ear problems Hematologic/Lymphatic Denies history of Anemia, Hemophilia, Human Immunodeficiency Virus, Lymphedema, Sickle Cell Disease Respiratory Patient has history of Asthma, Sleep Apnea Denies history of Aspiration, Chronic Obstructive Pulmonary Disease (COPD), Pneumothorax, Tuberculosis Cardiovascular Patient has history of Deep Vein Thrombosis - LLE 2010, Hypertension, Peripheral Venous Disease Denies history of Angina, Arrhythmia, Congestive Heart Failure, Coronary Artery Disease, Hypotension, Myocardial Infarction, Peripheral Arterial Disease, Phlebitis, Vasculitis Gastrointestinal Mohrmann, Makaleigh J. ([id number]) Denies history of Cirrhosis , Colitis, Crohn s, Hepatitis A, Hepatitis B, Hepatitis C Endocrine Denies history of Type I Diabetes, Type II Diabetes Genitourinary Denies history of End  Stage Renal Disease Immunological Denies history of Lupus Erythematosus, Raynaud s, Scleroderma Integumentary (Skin) Denies history of History of Burn, History of pressure wounds Musculoskeletal Patient has history of Osteoarthritis - neck Denies history of Gout, Rheumatoid Arthritis, Osteomyelitis Neurologic Denies history of Dementia, Neuropathy, Quadriplegia, Paraplegia, Seizure Disorder Oncologic Patient has history of Received Chemotherapy - interfeon immunotherapy, Received Radiation Psychiatric Denies history of Anorexia/bulimia, Confinement Anxiety Hospitalization/Surgery History - Melanoma left shoulder. Medical And Surgical History Notes Constitutional Symptoms (General Health) Vein ablation LLE 2010 Vascular Sx ( vein ablationo) LLE 02/2015 Eyes Horners syndrome Genitourinary Kidney stones Neurologic ct scan showed swollen lymph nodes Oncologic Melanoma (R) shoulder 07/2010; (R) axillary lymphnode removal Objective Constitutional Vitals Time Taken: 1:52 PM, Height: 63 in, Weight: 212 lbs, BMI: 37.6, Temperature: 98.5 F, Pulse: 84 bpm, Respiratory Rate: 18 breaths/min, Blood Pressure: 171/89 mmHg. General Notes: Left lower extremity: To the posterior aspect there is an open wound with Granulation tissue and nonviable tissue present. No signs of infection Right lower extremity: Open wound limited to skin breakdown. No obvious signs of infection. 3+ pitting edema to the knee Integumentary (Hair, Skin) Wound #13 status is Open. Original cause of wound was Gradually Appeared. The date acquired was: 02/24/2021. The wound has been in treatment 7 weeks. The wound is located on the Left,Distal,Posterior Lower Leg. The wound measures 2cm length x 2cm width x 0.1cm depth; 3.142cm^2 area and 0.314cm^3 volume. There is Fat Layer (Subcutaneous Tissue) exposed. There is no tunneling or undermining noted. There is a medium amount of serosanguineous drainage noted. There is medium  (34-66%) pink granulation within the wound bed. There is a medium (34-66%) amount of necrotic tissue within the wound bed including Adherent Slough. Wound #14 status is Open. Original cause of wound was Gradually Appeared. The date acquired was: 05/05/2021. The wound is located on the Right Lower Leg. The wound measures 5.5cm length x 4.5cm width x 0.1cm depth; 19.439cm^2 area and 1.944cm^3 volume. There is Fat Layer (Subcutaneous Tissue) exposed. There is no tunneling or undermining noted. There is a medium amount of serosanguineous drainage noted. There is medium (34-66%) red, pink granulation within the wound bed. There is a medium (34-66%) amount of necrotic tissue within the wound bed including Adherent Slough. Assessment Active Problems ICD-10 Chronic venous hypertension (idiopathic) with ulcer and inflammation of left lower extremity Lymphedema, not elsewhere classified Non-pressure chronic ulcer of left calf limited to breakdown of skin Hirschhorn, Moira J. ([id number]) Unspecified open wound, right lower leg, initial encounter Patient's wound to the left lower extremity is stable. I debrided nonviable tissue. I recommended continuing Hydrofera Blue with 3 layer compression. She now has a wound to her right lower extremity and I recommended silver alginate under 3 layer compression. No obvious signs of infection on exam. Also patient was approved for skin substitutes. She had an  allergic reaction to Apligraf and we will avoid this. Would like a better surface before we order this. Procedures Wound #13 Pre-procedure diagnosis of Wound #13 is a Venous Leg Ulcer located on the Left,Distal,Posterior Lower Leg .Severity of Tissue Pre Debridement is: Fat layer exposed. There was a Excisional Skin/Subcutaneous Tissue Debridement with a total area of 4 sq cm performed by Kalman Shan, MD. With the following instrument(s): Curette to remove Viable and Non-Viable tissue/material. Material removed  includes Subcutaneous Tissue, Slough, Skin: Dermis, and Skin: Epidermis after achieving pain control using Lidocaine 4% Topical Solution. No specimens were taken. A time out was conducted at 14:24, prior to the start of the procedure. A Moderate amount of bleeding was controlled with Pressure. The procedure was tolerated well with a pain level of 0 throughout and a pain level of 0 following the procedure. Post Debridement Measurements: 2cm length x 2cm width x 0.1cm depth; 0.314cm^3 volume. Character of Wound/Ulcer Post Debridement is improved. Severity of Tissue Post Debridement is: Fat layer exposed. Post procedure Diagnosis Wound #13: Same as Pre-Procedure Pre-procedure diagnosis of Wound #13 is a Venous Leg Ulcer located on the Left,Distal,Posterior Lower Leg . There was a Three Layer Compression Therapy Procedure by Carlene Coria, RN. Post procedure Diagnosis Wound #13: Same as Pre-Procedure Wound #14 Pre-procedure diagnosis of Wound #14 is a Venous Leg Ulcer located on the Right Lower Leg . There was a Three Layer Compression Therapy Procedure by Carlene Coria, RN. Post procedure Diagnosis Wound #14: Same as Pre-Procedure Plan Follow-up Appointments: Wound #13 Left,Distal,Posterior Lower Leg: Return Appointment in 1 week. Nurse Visit as needed Bathing/ Shower/ Hygiene: May shower with wound dressing protected with water repellent cover or cast protector. Anesthetic (Use 'Patient Medications' Section for Anesthetic Order Entry): Lidocaine applied to wound bed Edema Control - Lymphedema / Segmental Compressive Device / Other: Elevate, Exercise Daily and Avoid Standing for Long Periods of Time. Elevate legs to the level of the heart and pump ankles as often as possible Elevate leg(s) parallel to the floor when sitting. Compression Pump: Use compression pump on left lower extremity for 60 minutes, twice daily. - TWICE DAILY for 1 hour Compression Pump: Use compression pump on right lower  extremity for 60 minutes, twice daily. - TWICE DAILY for 1 hour WOUND #13: - Lower Leg Wound Laterality: Left, Posterior, Distal Cleanser: Soap and Water 1 x Per Week/30 Days Discharge Instructions: Gently cleanse wound with antibacterial soap, rinse and pat dry prior to dressing wounds Topical: Santyl Collagenase Ointment, 30 (gm), tube (Home Health) 1 x Per Week/30 Days Discharge Instructions: apply nickel thick to wound bed only Primary Dressing: Hydrofera Blue Ready Transfer Foam, 2.5x2.5 (in/in) (Generic) 1 x Per Week/30 Days Discharge Instructions: Apply Hydrofera Blue Ready to wound bed as directed Secondary Dressing: ABD Pad 5x9 (in/in) 1 x Per Week/30 Days Discharge Instructions: Cover with ABD pad Compression Wrap: Profore Lite LF 3 Multilayer Compression Bandaging System 1 x Per Week/30 Days Discharge Instructions: Apply 3 multi-layer wrap as prescribed. WOUND #14: - Lower Leg Wound Laterality: Right Cleanser: Soap and Water 1 x Per Week/30 Days Discharge Instructions: Gently cleanse wound with antibacterial soap, rinse and pat dry prior to dressing wounds Primary Dressing: Silvercel Small 2x2 (in/in) 1 x Per Week/30 Days BRINIYA, LANGEL ([id number]) Discharge Instructions: Apply Silvercel Small 2x2 (in/in) as instructed Secondary Dressing: ABD Pad 5x9 (in/in) 1 x Per Week/30 Days Discharge Instructions: Cover with ABD pad Compression Wrap: Profore Lite LF 3 Multilayer Compression Bandaging System 1  x Per Week/30 Days Discharge Instructions: Apply 3 multi-layer wrap as prescribed. 1. In office sharp debridement 2. Hydrofera Blue under 3 layer compression to the left leg 3. Silver alginate under 3 layer compression to the right leg Electronic Signature(s) Signed: 05/31/2021 3:06:46 PM By: Kalman Shan DO Entered By: Kalman Shan on 05/31/2021 15:04:49 Favata, [name] -------------------------------------------------------------------------------- ROS/PFSH  Details Patient Name: CICILIA, MASEDA. Date of Service: 05/31/2021 1:30 PM Medical Record Number: [id number] Patient Account Number: [id number] Date of Birth/Sex: January 15, 1946 (75 y.o. F) Treating RN: Cornell Barman Primary Care Provider: [name] Other Clinician: Referring Provider: [name] Treating Provider/Extender: Yaakov Guthrie in Treatment: 7 Label Progress Note Print Version as History and Physical for this encounter Information Obtained From Patient Constitutional Symptoms (General Health) Medical History: Past Medical History Notes: Vein ablation LLE 2010 Vascular Sx ( vein ablationo) LLE 02/2015 Eyes Medical History: Positive for: Cataracts Negative for: Glaucoma; Optic Neuritis Past Medical History Notes: Horners syndrome Ear/Nose/Mouth/Throat Medical History: Negative for: Chronic sinus problems/congestion; Middle ear problems Hematologic/Lymphatic Medical History: Negative for: Anemia; Hemophilia; Human Immunodeficiency Virus; Lymphedema; Sickle Cell Disease Respiratory Medical History: Positive for: Asthma; Sleep Apnea Negative for: Aspiration; Chronic Obstructive Pulmonary Disease (COPD); Pneumothorax; Tuberculosis Cardiovascular Medical History: Positive for: Deep Vein Thrombosis - LLE 2010; Hypertension; Peripheral Venous Disease Negative for: Angina; Arrhythmia; Congestive Heart Failure; Coronary Artery Disease; Hypotension; Myocardial Infarction; Peripheral Arterial Disease; Phlebitis; Vasculitis Gastrointestinal Medical History: Negative for: Cirrhosis ; Colitis; Crohnos; Hepatitis A; Hepatitis B; Hepatitis C Endocrine Medical History: Negative for: Type I Diabetes; Type II Diabetes Genitourinary Medical History: Negative for: End Stage Renal Disease Past Medical History Notes: Kidney stones LENDER, PASOS ([id number]) Immunological Medical History: Negative for: Lupus Erythematosus; Raynaudos; Scleroderma Integumentary  (Skin) Medical History: Negative for: History of Burn; History of pressure wounds Musculoskeletal Medical History: Positive for: Osteoarthritis - neck Negative for: Gout; Rheumatoid Arthritis; Osteomyelitis Neurologic Medical History: Negative for: Dementia; Neuropathy; Quadriplegia; Paraplegia; Seizure Disorder Past Medical History Notes: ct scan showed swollen lymph nodes Oncologic Medical History: Positive for: Received Chemotherapy - interfeon immunotherapy; Received Radiation Past Medical History Notes: Melanoma (R) shoulder 07/2010; (R) axillary lymphnode removal Psychiatric Medical History: Negative for: Anorexia/bulimia; Confinement Anxiety HBO Extended History Items Eyes: Cataracts Immunizations Pneumococcal Vaccine: Received Pneumococcal Vaccination: Yes Received Pneumococcal Vaccination On or After 60th Birthday: No Immunization Notes: tetanus shot w/in the last 5 years per pt Implantable Devices None Hospitalization / Surgery History Type of Hospitalization/Surgery Melanoma left shoulder Family and Social History Cancer: Yes - Mother,Paternal Grandparents; Diabetes: No; Heart Disease: No; Hereditary Spherocytosis: No; Hypertension: Yes - Mother,Father; Kidney Disease: Yes - Paternal Grandparents; Lung Disease: Yes - Paternal Grandparents; Seizures: No; Stroke: No; Thyroid Problems: No; Tuberculosis: No; Former smoker - quit at age 52 - ended on 04/19/1966; Marital Status - Divorced; Alcohol Use: Never; Drug Use: No History; Caffeine Use: Daily; Financial Concerns: No; Food, Clothing or Shelter Needs: No; Support System Lacking: No; Transportation Concerns: No Electronic Signature(s) Signed: 05/31/2021 3:06:46 PM By: Kalman Shan DO Signed: 05/31/2021 5:51:34 PM By: [name], [name], RN, CWS, [name], [name] Entered By: Kalman Shan on 05/31/2021 14:53:53 Luzader, [name]  ([id number]) -------------------------------------------------------------------------------- SuperBill Details Patient Name: CHIA, DAHLMAN. Date of Service: 05/31/2021 Medical Record Number: [id number] Patient Account Number: [id number] Date of Birth/Sex: 02/08/1946 (75 y.o. F) Treating RN: Carlene Coria Primary Care Provider: [name] Other Clinician: Referring Provider: [name] Treating Provider/Extender: Yaakov Guthrie in Treatment: 7 Diagnosis Coding ICD-10 Codes Code Description [phone] Chronic venous  hypertension (idiopathic) with ulcer and inflammation of left lower extremity I89.0 Lymphedema, not elsewhere classified L97.221 Non-pressure chronic ulcer of left calf limited to breakdown of skin S81.801A Unspecified open wound, right lower leg, initial encounter Facility Procedures CPT4 Code Description: [id number] 11042 - DEB SUBQ TISSUE 20 SQ CM/< Modifier: Quantity: 1 CPT4 Code Description: ICD-10 Diagnosis Description I87.332 Chronic venous hypertension (idiopathic) with ulcer and inflammation of l L97.221 Non-pressure chronic ulcer of left calf limited to breakdown of skin I89.0 Lymphedema, not elsewhere classified Modifier: eft lower extremity Quantity: Physician Procedures CPT4 Code Description: [id number] - WC PHYS LEVEL 3 - EST PT Modifier: Quantity: 1 CPT4 Code Description: ICD-10 Diagnosis Description [id number] Unspecified open wound, right lower leg, initial encounter I89.0 Lymphedema, not elsewhere classified Modifier: Quantity: CPT4 Code Description: [id number] 11042 - WC PHYS SUBQ TISS 20 SQ CM Modifier: Quantity: 1 CPT4 Code Description: ICD-10 Diagnosis Description I87.332 Chronic venous hypertension (idiopathic) with ulcer and inflammation of l L97.221 Non-pressure chronic ulcer of left calf limited to breakdown of skin I89.0 Lymphedema, not elsewhere classified Modifier: eft lower extremity Quantity: Electronic Signature(s) Signed:  05/31/2021 3:06:46 PM By: Kalman Shan DO Entered By: Kalman Shan on 05/31/2021 15:05:36

## 2021-06-03 NOTE — Progress Notes (Signed)
[name], [name] ([name]) Visit Report for 05/31/2021 Arrival Information Details Patient Name: [name], [name]. Date of Service: 05/31/2021 1:30 PM Medical Record Number: [name] Patient Account Number: [id number] Date of Birth/Sex: 04/17/1946 (75 y.o. F) Treating RN: [name] Primary Care [name]: [name] Other Clinician: Referring [name]: [name] Treating [name]/Extender: [name] in Treatment: 7 Visit Information History Since Last Visit All ordered tests and consults were completed: No Patient Arrived: Ambulatory Added or deleted any medications: No Arrival Time: 13:50 Any new allergies or adverse reactions: No Accompanied By: self Had a fall or experienced change in No Transfer Assistance: None activities of daily living that may affect Patient Identification Verified: Yes risk of falls: Secondary Verification Process Completed: Yes Signs or symptoms of abuse/neglect since last visito No Patient Requires Transmission-Based No Hospitalized since last visit: No Precautions: Implantable device outside of the clinic excluding No Patient Has Alerts: Yes cellular tissue based products placed in the center Patient Alerts: Patient on Blood since last visit: Thinner Has Dressing in Place as Prescribed: Yes ASPIRIN Has Compression in Place as Prescribed: Yes NOT diabetic Pain Present Now: No Electronic Signature(s) Signed: 06/02/2021 4:32:50 PM By: Amber Coria RN Entered By: [name] on 05/31/2021 13:52:19 [name], [name] ([name]) -------------------------------------------------------------------------------- Clinic Level of Care Assessment Details Patient Name: [name], [name]. Date of Service: 05/31/2021 1:30 PM Medical Record Number: [name] Patient Account Number: [id number] Date of Birth/Sex: [birth year] (75 y.o. F) Treating RN: [name] Primary Care [name]: [name] Other Clinician: Referring  [name]: [name] Treating [name]/Extender: [name] in Treatment: 7 Clinic Level of Care Assessment Items TOOL 1 Quantity Score '[]'$  - Use when EandM and Procedure is performed on INITIAL visit 0 ASSESSMENTS - Nursing Assessment / Reassessment '[]'$  - General Physical Exam (combine w/ comprehensive assessment (listed just below) when performed on new 0 pt. evals) '[]'$  - 0 Comprehensive Assessment (HX, ROS, Risk Assessments, Wounds Hx, etc.) ASSESSMENTS - Wound and Skin Assessment / Reassessment '[]'$  - Dermatologic / Skin Assessment (not related to wound area) 0 ASSESSMENTS - Ostomy and/or Continence Assessment and Care '[]'$  - Incontinence Assessment and Management 0 '[]'$  - 0 Ostomy Care Assessment and Management (repouching, etc.) PROCESS - Coordination of Care '[]'$  - Simple Patient / Family Education for ongoing care 0 '[]'$  - 0 Complex (extensive) Patient / Family Education for ongoing care '[]'$  - 0 Staff obtains Programmer, systems, Records, Test Results / Process Orders '[]'$  - 0 Staff telephones HHA, Nursing Homes / Clarify orders / etc '[]'$  - 0 Routine Transfer to another Facility (non-emergent condition) '[]'$  - 0 Routine Hospital Admission (non-emergent condition) '[]'$  - 0 New Admissions / Biomedical engineer / Ordering NPWT, Apligraf, etc. '[]'$  - 0 Emergency Hospital Admission (emergent condition) PROCESS - Special Needs '[]'$  - Pediatric / Minor Patient Management 0 '[]'$  - 0 Isolation Patient Management '[]'$  - 0 Hearing / Language / Visual special needs '[]'$  - 0 Assessment of Community assistance (transportation, D/C planning, etc.) '[]'$  - 0 Additional assistance / Altered mentation '[]'$  - 0 Support Surface(s) Assessment (bed, cushion, seat, etc.) INTERVENTIONS - Miscellaneous '[]'$  - External ear exam 0 '[]'$  - 0 Patient Transfer (multiple staff / Civil Service fast streamer / Similar devices) '[]'$  - 0 Simple Staple / Suture removal (25 or less) '[]'$  - 0 Complex Staple / Suture removal (26 or more) '[]'$   - 0 Hypo/Hyperglycemic Management (do not check if billed separately) '[]'$  - 0 Ankle / Brachial Index (ABI) - do not check if billed separately Has the  patient been seen at the hospital within the last three years: Yes Total Score: 0 Level Of Care: ____ [name] ([name]) Electronic Signature(s) Signed: 06/02/2021 4:32:50 PM By: Amber Coria RN Entered By: [name] on 05/31/2021 14:23:12 [name], [name] ([name]) -------------------------------------------------------------------------------- Compression Therapy Details Patient Name: [name]. Date of Service: 05/31/2021 1:30 PM Medical Record Number: [name] Patient Account Number: [id number] Date of Birth/Sex: 1946-03-02 (75 y.o. F) Treating RN: [name] Primary Care Drae Mitzel: [name] Other Clinician: Referring Nasya Vincent: [name] Treating [name]/Extender: [name] in Treatment: 7 Compression Therapy Performed for Wound Assessment: Wound #13 Left,Distal,Posterior Lower Leg Performed By: Clinician Amber Coria, RN Compression Type: Three Layer Post Procedure Diagnosis Same as Pre-procedure Electronic Signature(s) Signed: 06/02/2021 4:32:50 PM By: Amber Coria RN Entered By: [name] on 05/31/2021 14:21:09 [name], [name] ([name]) -------------------------------------------------------------------------------- Compression Therapy Details Patient Name: [name]. Date of Service: 05/31/2021 1:30 PM Medical Record Number: [name] Patient Account Number: [id number] Date of Birth/Sex: [birth year] (75 y.o. F) Treating RN: [name] Primary Care Gionni Freese: [name] Other Clinician: Referring [name]: [name] Treating [name]/Extender: [name] in Treatment: 7 Compression Therapy Performed for Wound Assessment: Wound #14 Right Lower Leg Performed By: Clinician Amber Coria, RN Compression Type: Three Layer Post Procedure  Diagnosis Same as Pre-procedure Electronic Signature(s) Signed: 06/02/2021 4:32:50 PM By: Amber Coria RN Entered By: [name] on 05/31/2021 14:21:09 [name], [name] ([name]) -------------------------------------------------------------------------------- Encounter Discharge Information Details Patient Name: [name], [name]. Date of Service: 05/31/2021 1:30 PM Medical Record Number: [name] Patient Account Number: [id number] Date of Birth/Sex: [birth year] (75 y.o. F) Treating RN: [name] Primary Care Kenetra Hildenbrand: [name] Other Clinician: Referring Dino Borntreger: [name] Treating Patrecia Veiga/Extender: [name] in Treatment: 7 Encounter Discharge Information Items Post Procedure Vitals Discharge Condition: Stable Temperature (F): 98.5 Ambulatory Status: Ambulatory Pulse (bpm): 84 Discharge Destination: Home Respiratory Rate (breaths/min): 18 Transportation: Private Auto Blood Pressure (mmHg): 171/89 Accompanied By: self Schedule Follow-up Appointment: Yes Clinical Summary of Care: Patient Declined Electronic Signature(s) Signed: 06/02/2021 4:32:50 PM By: Amber Coria RN Entered By: [name] on 05/31/2021 14:49:38 [name], [name] ([name]) -------------------------------------------------------------------------------- Lower Extremity Assessment Details Patient Name: [name], [name]. Date of Service: 05/31/2021 1:30 PM Medical Record Number: [name] Patient Account Number: [id number] Date of Birth/Sex: [birth year] (75 y.o. F) Treating RN: [name] Primary Care Janiel Crisostomo: [name] Other Clinician: Referring Ranae Casebier: [name] Treating Nyeli [name]/Extender: [name] in Treatment: 7 Edema Assessment Assessed: [Left: No] [Right: No] Edema: [Left: Yes] [Right: Yes] Calf Left: Right: Point of Measurement: 32 cm From Medial Instep 44 cm 46 cm Ankle Left: Right: Point of Measurement: 10 cm From Medial  Instep 23 cm 24 cm Vascular Assessment Pulses: Dorsalis Pedis Palpable: [Left:Yes] [Right:Yes] Electronic Signature(s) Signed: 06/02/2021 4:32:50 PM By: Amber Coria RN Entered By: [name] on 05/31/2021 14:03:38 [name], [name] ([name]) -------------------------------------------------------------------------------- Multi Wound Chart Details Patient Name: [name], [name]. Date of Service: 05/31/2021 1:30 PM Medical Record Number: [name] Patient Account Number: [id number] Date of Birth/Sex: 1946/02/03 (75 y.o. F) Treating RN: [name] Primary Care Liliana Dang: [name] Other Clinician: Referring Pascale Maves: [name] Treating Sharifa Bucholz/Extender: [name] in Treatment: 7 Vital Signs Height(in): 63 Pulse(bpm): 25 Weight(lbs): 212 Blood Pressure(mmHg): 171/89 Body Mass Index(BMI): 38 Temperature(F): 98.5 Respiratory Rate(breaths/min): 18 Photos: [N/A:N/A] Wound Location: Left, Distal, Posterior Lower Leg Right Lower Leg N/A Wounding Event: Gradually Appeared Gradually Appeared N/A Primary Etiology: Venous Leg Ulcer Venous Leg Ulcer N/A Comorbid History: Cataracts,  Asthma, Sleep Apnea, Cataracts, Asthma, Sleep Apnea, N/A Deep Vein Thrombosis, Deep Vein Thrombosis, Hypertension, Peripheral Venous Hypertension, Peripheral Venous Disease, Osteoarthritis, Received Disease, Osteoarthritis, Received Chemotherapy, Received Radiation Chemotherapy, Received Radiation Date Acquired: 02/24/2021 05/05/2021 N/A Weeks of Treatment: 7 0 N/A Wound Status: Open Open N/A Measurements L x W x D (cm) 2x2x0.1 5.5x4.5x0.1 N/A Area (cm) : 3.142 19.439 N/A Volume (cm) : 0.314 1.944 N/A % Reduction in Area: 46.70% N/A N/A % Reduction in Volume: 46.70% N/A N/A Classification: Full Thickness Without Exposed Full Thickness Without Exposed N/A Support Structures Support Structures Exudate Amount: Medium Medium N/A Exudate Type: Serosanguineous Serosanguineous  N/A Exudate Color: red, brown red, brown N/A Granulation Amount: Medium (34-66%) Medium (34-66%) N/A Granulation Quality: Pink Red, Pink N/A Necrotic Amount: Medium (34-66%) Medium (34-66%) N/A Exposed Structures: Fat Layer (Subcutaneous Tissue): Fat Layer (Subcutaneous Tissue): N/A Yes Yes Fascia: No Fascia: No Tendon: No Tendon: No Muscle: No Muscle: No Joint: No Joint: No Bone: No Bone: No Epithelialization: None None N/A Debridement: Debridement - Excisional N/A N/A Pre-procedure Verification/Time 14:24 N/A N/A Out Taken: Pain Control: Lidocaine 4% Topical Solution N/A N/A Tissue Debrided: Subcutaneous, Slough N/A N/A Level: Skin/Subcutaneous Tissue N/A N/A Debridement Area (sq cm): 4 N/A N/A Instrument: Curette N/A N/A Bleeding: Moderate N/A N/A Hemostasis Achieved: Pressure N/A N/A Procedural Pain: 0 N/A N/A RAMESHA, FRISQUE. ([id number]) Post Procedural Pain: 0 N/A N/A Debridement Treatment Procedure was tolerated well N/A N/A Response: Post Debridement 2x2x0.1 N/A N/A Measurements L x W x D (cm) Post Debridement Volume: 0.314 N/A N/A (cm) Procedures Performed: Compression Therapy Compression Therapy N/A Debridement Treatment Notes Electronic Signature(s) Signed: 05/31/2021 3:06:46 PM By: Kalman Shan DO Entered By: Kalman Shan on 05/31/2021 14:29:57 [place], [name] -------------------------------------------------------------------------------- Multi-Disciplinary Care Plan Details Patient Name: BAKER, YAUCH. Date of Service: 05/31/2021 1:30 PM Medical Record Number: [id number] Patient Account Number: [id number] Date of Birth/Sex: 02-15-1946 (75 y.o. F) Treating RN: [name] Primary Care Denis Koppel: [name] Other Clinician: Referring Alyxander Kollmann: [name] Treating Liannah Yarbough/Extender: [name] in Treatment: 7 Active Inactive Wound/Skin Impairment Nursing Diagnoses: Impaired tissue  integrity Goals: Patient/caregiver will verbalize understanding of skin care regimen Date Initiated: 04/12/2021 Target Resolution Date: 06/03/2021 Goal Status: Active Ulcer/skin breakdown will have a volume reduction of 30% by week 4 Date Initiated: 04/12/2021 Target Resolution Date: 05/12/2021 Goal Status: Active Interventions: Assess ulceration(s) every visit Treatment Activities: Referred to DME Oneal Biglow for dressing supplies : 04/12/2021 Skin care regimen initiated : 04/12/2021 Notes: Electronic Signature(s) Signed: 06/02/2021 4:32:50 PM By: Amber Coria RN Entered By: [name] on 05/31/2021 14:20:13 Petre, [name] -------------------------------------------------------------------------------- Pain Assessment Details Patient Name: CANDIA, ISLEY. Date of Service: 05/31/2021 1:30 PM Medical Record Number: [id number] Patient Account Number: [id number] Date of Birth/Sex: [name] 31, 1946 (75 y.o. F) Treating RN: [name] Primary Care Ahmed Inniss: [name] Other Clinician: Referring Azeez Dunker: [name] Treating Bray Vickerman/Extender: [name] in Treatment: 7 Active Problems Location of Pain Severity and Description of Pain Patient Has Paino No Site Locations Pain Management and Medication Current Pain Management: Electronic Signature(s) Signed: 06/02/2021 4:32:50 PM By: Amber Coria RN Entered By: [name] on 05/31/2021 13:52:45 Burright, [name] -------------------------------------------------------------------------------- Patient/Caregiver Education Details Patient Name: ZAHMYA, BOREL. Date of Service: 05/31/2021 1:30 PM Medical Record Number: [id number] Patient Account Number: [id number] Date of Birth/Gender: 1946/10/25 (75 y.o. F) Treating RN: [name] Primary Care Physician: [name] Other Clinician: Referring Physician: Ria [name] Treating Physician/Extender: [name] in Treatment:  7 Education  Assessment Education Provided To: Patient Education Topics Provided Wound/Skin Impairment: Methods: Explain/Verbal Responses: State content correctly Electronic Signature(s) Signed: 06/02/2021 4:32:50 PM By: Amber Coria RN Entered By: [name] on 05/31/2021 14:27:55 Donathan, [name] ([name]) -------------------------------------------------------------------------------- Wound Assessment Details Patient Name: JOLICIA, MARCHI. Date of Service: 05/31/2021 1:30 PM Medical Record Number: [name] Patient Account Number: [id number] Date of Birth/Sex: 08-[name]-1946 (75 y.o. F) Treating RN: [name] Primary Care Feather Berrie: [name] Other Clinician: Referring Batool Majid: [name] Treating Laury Huizar/Extender: [name] in Treatment: 7 Wound Status Wound Number: 13 Primary Venous Leg Ulcer Etiology: Wound Location: Left, Distal, Posterior Lower Leg Wound Open Wounding Event: Gradually Appeared Status: Date Acquired: 02/24/2021 Comorbid Cataracts, Asthma, Sleep Apnea, Deep Vein Thrombosis, Weeks Of Treatment: 7 History: Hypertension, Peripheral Venous Disease, Osteoarthritis, Clustered Wound: No Received Chemotherapy, Received Radiation Photos Wound Measurements Length: (cm) 2 Width: (cm) 2 Depth: (cm) 0.1 Area: (cm) 3.142 Volume: (cm) 0.314 % Reduction in Area: 46.7% % Reduction in Volume: 46.7% Epithelialization: None Tunneling: No Undermining: No Wound Description Classification: Full Thickness Without Exposed Support Structures Exudate Amount: Medium Exudate Type: Serosanguineous Exudate Color: red, brown Foul Odor After Cleansing: No Slough/Fibrino Yes Wound Bed Granulation Amount: Medium (34-66%) Exposed Structure Granulation Quality: Pink Fascia Exposed: No Necrotic Amount: Medium (34-66%) Fat Layer (Subcutaneous Tissue) Exposed: Yes Necrotic Quality: Adherent Slough Tendon Exposed: No Muscle Exposed: No Joint  Exposed: No Bone Exposed: No Treatment Notes Wound #13 (Lower Leg) Wound Laterality: Left, Posterior, Distal Cleanser Soap and Water Discharge Instruction: Gently cleanse wound with antibacterial soap, rinse and pat dry prior to dressing wounds Peri-Wound Care MARZIA, TRIPPLETT ([name]) Topical Santyl Collagenase Ointment, 30 (gm), tube Discharge Instruction: apply nickel thick to wound bed only Primary Dressing Hydrofera Blue Ready Transfer Foam, 2.5x2.5 (in/in) Discharge Instruction: Apply Hydrofera Blue Ready to wound bed as directed Secondary Dressing ABD Pad 5x9 (in/in) Discharge Instruction: Cover with ABD pad Secured With Compression Wrap Profore Lite LF 3 Multilayer Compression Bandaging System Discharge Instruction: Apply 3 multi-layer wrap as prescribed. Compression Stockings Add-Ons Electronic Signature(s) Signed: 06/02/2021 4:32:50 PM By: Amber Coria RN Entered By: [name] on 05/31/2021 14:02:16 AMERICA, CALICA ([name]) -------------------------------------------------------------------------------- Wound Assessment Details Patient Name: ZHARIYAH, BARRILE. Date of Service: 05/31/2021 1:30 PM Medical Record Number: [name] Patient Account Number: [id number] Date of Birth/Sex: [birth year] (75 y.o. F) Treating RN: [name] Primary Care Gwenyth Dingee: [name] Other Clinician: Referring Kayse Puccini: [name] Treating Ashlan Dignan/Extender: [name] in Treatment: 7 Wound Status Wound Number: 14 Primary Venous Leg Ulcer Etiology: Wound Location: Right Lower Leg Wound Open Wounding Event: Gradually Appeared Status: Date Acquired: 05/05/2021 Comorbid Cataracts, Asthma, Sleep Apnea, Deep Vein Thrombosis, Weeks Of Treatment: 0 History: Hypertension, Peripheral Venous Disease, Osteoarthritis, Clustered Wound: No Received Chemotherapy, Received Radiation Photos Wound Measurements Length: (cm) 5.5 Width: (cm) 4.5 Depth: (cm)  0.1 Area: (cm) 19.439 Volume: (cm) 1.944 % Reduction in Area: % Reduction in Volume: Epithelialization: None Tunneling: No Undermining: No Wound Description Classification: Full Thickness Without Exposed Support Structures Exudate Amount: Medium Exudate Type: Serosanguineous Exudate Color: red, brown Foul Odor After Cleansing: No Slough/Fibrino Yes Wound Bed Granulation Amount: Medium (34-66%) Exposed Structure Granulation Quality: Red, Pink Fascia Exposed: No Necrotic Amount: Medium (34-66%) Fat Layer (Subcutaneous Tissue) Exposed: Yes Necrotic Quality: Adherent Slough Tendon Exposed: No Muscle Exposed: No Joint Exposed: No Bone Exposed: No Treatment Notes Wound #14 (Lower Leg) Wound Laterality: Right Cleanser Soap and Water Discharge Instruction: Gently cleanse wound with antibacterial soap, rinse and pat dry  prior to dressing wounds Peri-Wound Care AQUINNAH, VUNCANNON ([id number]) Topical Primary Dressing Silvercel Small 2x2 (in/in) Discharge Instruction: Apply Silvercel Small 2x2 (in/in) as instructed Secondary Dressing ABD Pad 5x9 (in/in) Discharge Instruction: Cover with ABD pad Secured With Compression Wrap Profore Lite LF 3 Multilayer Compression Bandaging System Discharge Instruction: Apply 3 multi-layer wrap as prescribed. Compression Stockings Add-Ons Electronic Signature(s) Signed: 06/02/2021 4:32:50 PM By: Amber Coria RN Entered By: [name] on 05/31/2021 14:01:38 CHASTELYN, RENNARD ([id number]) -------------------------------------------------------------------------------- Vitals Details Patient Name: JAZARIYAH, GRIBBINS. Date of Service: 05/31/2021 1:30 PM Medical Record Number: [id number] Patient Account Number: [id number] Date of Birth/Sex: 08-08-1946 (75 y.o. F) Treating RN: [name] Primary Care Girard Koontz: [name] Other Clinician: Referring Batsheva Stevick: [name] Treating Kaven Cumbie/Extender: [name] in Treatment:  7 Vital Signs Time Taken: 13:52 Temperature (F): 98.5 Height (in): 63 Pulse (bpm): 84 Weight (lbs): 212 Respiratory Rate (breaths/min): 18 Body Mass Index (BMI): 37.6 Blood Pressure (mmHg): 171/89 Reference Range: 80 - 120 mg / dl Electronic Signature(s) Signed: 06/02/2021 4:32:50 PM By: Amber Coria RN Entered By: [name] on 05/31/2021 13:52:37

## 2021-06-05 ENCOUNTER — Telehealth: Payer: Self-pay

## 2021-06-05 DIAGNOSIS — Z882 Allergy status to sulfonamides status: Secondary | ICD-10-CM | POA: Diagnosis not present

## 2021-06-05 DIAGNOSIS — M5441 Lumbago with sciatica, right side: Secondary | ICD-10-CM | POA: Diagnosis not present

## 2021-06-05 DIAGNOSIS — Z87891 Personal history of nicotine dependence: Secondary | ICD-10-CM | POA: Diagnosis not present

## 2021-06-05 DIAGNOSIS — Z7982 Long term (current) use of aspirin: Secondary | ICD-10-CM | POA: Diagnosis not present

## 2021-06-05 DIAGNOSIS — Z79899 Other long term (current) drug therapy: Secondary | ICD-10-CM | POA: Diagnosis not present

## 2021-06-05 DIAGNOSIS — I10 Essential (primary) hypertension: Secondary | ICD-10-CM | POA: Diagnosis not present

## 2021-06-05 NOTE — Telephone Encounter (Signed)
I spoke with pt; pt said the pain in rt leg is excrutiating now and pt having difficulty in walking even with a walker. Pt is going to ED in [facility] now. Sending note to Dr Darnell Level as PCP.

## 2021-06-05 NOTE — Telephone Encounter (Signed)
Noted. Agree with ER eval for pain uncontrolled with tramadol.  Last seen for this 12/2020.  Plz call tomorrow for update on symptoms.

## 2021-06-05 NOTE — Telephone Encounter (Signed)
[facility] Night - Client TELEPHONE ADVICE RECORD [facility] Patient Name: [name] [place] Gender: Female DOB: 1945/11/14 Age: 75 Y 10 M 28 D Return Phone Number: [phone] (Primary), [phone] (Secondary) Address: City/ State/ Zip: [place] [address] Client [facility] Night - Client Client Site [place] Physician [name] - MD Contact Type Call Who Is Calling Patient / Member / Family / Caregiver Call Type Triage / Clinical Relationship To Patient Self Return Phone Number [phone] (Secondary) Chief Complaint Back Pain - General Reason for Call Symptomatic / Request for [place] states she is experiencing sciatica back pain needing medication called in. [place] ER Translation No Nurse Assessment Nurse: [name], RN, Lelan Pons Date/Time [name] Time): 06/05/2021 8:44:38 AM Confirm and document reason for call. If symptomatic, describe symptoms. ---Caller states she is experiencing sciatica and right back pain. Seen about this in March or April. Pain is excruciating over 10/10. She is only able to shuffle feet. Taking tramadol since Saturday. Does the patient have any new or worsening symptoms? ---Yes Will a triage be completed? ---Yes Related visit to physician within the last 2 weeks? ---No Does the PT have any chronic conditions? (i.e. diabetes, asthma, this includes High risk factors for pregnancy, etc.) ---Yes List chronic conditions. ---HTN, sleep apnea Is this a behavioral health or substance abuse call? ---No Guidelines Guideline Title Affirmed Question Affirmed Notes Nurse Date/Time (Eastern Time) Back Pain [1] SEVERE back pain (e.g., excruciating) AND [2] sudden onset AND [3] age > 74 years Stormy Card 06/05/2021 8:48:32 AM PLEASE NOTE: All timestamps contained within this report are  represented as [place] Standard Time. CONFIDENTIALTY NOTICE: This fax transmission is intended only for the addressee. It contains information that is legally privileged, confidential or otherwise protected from use or disclosure. If you are not the intended recipient, you are strictly prohibited from reviewing, disclosing, copying using or disseminating any of this information or taking any action in reliance on or regarding this information. If you have received this fax in error, please notify us immediately by telephone so that we can arrange for its return to [place]. Phone: [phone], Toll-Free: [phone], Fax: [phone] Page: 2 of 2 Call Id: [id number] [name]. Time [name] Time) Disposition Final User 06/05/2021 8:29:44 AM Send To RN Personal Markus Daft, RN, Windy 06/05/2021 8:43:17 AM Attempt made - message left [name], RN, Lelan Pons 06/05/2021 8:53:19 AM Go to ED Now Yes [name], RN, [place] Disagree/Comply Comply Caller Understands Yes PreDisposition Did not know what to do Care Advice Given Per Guideline GO TO ED NOW: * You need to be seen in the Emergency Department. * Leave now. Drive carefully. ANOTHER ADULT SHOULD DRIVE: * It is better and safer if another adult drives instead of you. CARE ADVICE given per Back Pain (Adult) guideline. Referrals GO TO FACILITY OTHER - SPECIFY

## 2021-06-05 NOTE — Telephone Encounter (Signed)
Noted  

## 2021-06-06 NOTE — Telephone Encounter (Signed)
Spoke with pt asking for update.  States she is doing much better.  She was given shot before leaving ER which had significantly reduced pain by the time pt arrived home.  She was also given oxycodone, prednisone and told to double up on Extra Strength Tylenol (total 1,000 mg total).  Says she was told to f/u with PCP.  Scheduled ER f/u on 06/09/21 at 12:00.

## 2021-06-07 ENCOUNTER — Other Ambulatory Visit: Payer: Self-pay

## 2021-06-07 ENCOUNTER — Encounter: Payer: Medicare Other | Attending: Internal Medicine | Admitting: Internal Medicine

## 2021-06-07 DIAGNOSIS — I890 Lymphedema, not elsewhere classified: Secondary | ICD-10-CM | POA: Diagnosis not present

## 2021-06-07 DIAGNOSIS — Z8349 Family history of other endocrine, nutritional and metabolic diseases: Secondary | ICD-10-CM | POA: Insufficient documentation

## 2021-06-07 DIAGNOSIS — Z86718 Personal history of other venous thrombosis and embolism: Secondary | ICD-10-CM | POA: Diagnosis not present

## 2021-06-07 DIAGNOSIS — L97221 Non-pressure chronic ulcer of left calf limited to breakdown of skin: Secondary | ICD-10-CM

## 2021-06-07 DIAGNOSIS — Z87891 Personal history of nicotine dependence: Secondary | ICD-10-CM | POA: Insufficient documentation

## 2021-06-07 DIAGNOSIS — Z8249 Family history of ischemic heart disease and other diseases of the circulatory system: Secondary | ICD-10-CM | POA: Diagnosis not present

## 2021-06-07 DIAGNOSIS — Z7902 Long term (current) use of antithrombotics/antiplatelets: Secondary | ICD-10-CM | POA: Insufficient documentation

## 2021-06-07 DIAGNOSIS — S81801A Unspecified open wound, right lower leg, initial encounter: Secondary | ICD-10-CM | POA: Diagnosis not present

## 2021-06-07 DIAGNOSIS — L97821 Non-pressure chronic ulcer of other part of left lower leg limited to breakdown of skin: Secondary | ICD-10-CM | POA: Diagnosis not present

## 2021-06-07 NOTE — Progress Notes (Signed)
[name] Visit Report for 06/07/2021 Arrival Information Details Patient Name: [name], [name]. Date of Service: 06/07/2021 1:15 PM Medical Record Number: [id number] Patient Account Number: [id number] Date of Birth/Sex: 1946/05/27 (75 y.o. F) Treating RN: [name] Primary Care Wille Aubuchon: [name] Other Clinician: Referring [name]: [name] Treating [name]/Extender: [name] in Treatment: 8 Visit Information History Since Last Visit Added or deleted any medications: No Patient Arrived: Wheel Chair Had a fall or experienced change in No Arrival Time: 13:30 activities of daily living that may affect Accompanied By: self risk of falls: Transfer Assistance: EasyPivot Patient Lift Hospitalized since last visit: No Patient Identification Verified: Yes Has Dressing in Place as Prescribed: Yes Secondary Verification Process Completed: Yes Pain Present Now: Yes Patient Requires Transmission-Based No Precautions: Patient Has Alerts: Yes Patient Alerts: Patient on Blood Thinner ASPIRIN NOT diabetic Electronic Signature(s) Signed: 06/07/2021 3:01:59 PM By: [name] Entered By: [name] on 06/07/2021 13:31:38 [name], [name] -------------------------------------------------------------------------------- Clinic Level of Care Assessment Details Patient Name: [name], [name]. Date of Service: 06/07/2021 1:15 PM Medical Record Number: [id number] Patient Account Number: [id number] Date of Birth/Sex: Mar 14, 1946 (75 y.o. F) Treating RN: [name] Primary Care Waddell Iten: [name] Other Clinician: Referring Brennin [name]: [name] Treating [name]/Extender: [name] in Treatment: 8 Clinic Level of Care Assessment Items TOOL 1 Quantity Score '[]'$  - Use when EandM and Procedure is performed on INITIAL visit 0 ASSESSMENTS - Nursing Assessment / Reassessment '[]'$  - General Physical Exam (combine w/  comprehensive assessment (listed just below) when performed on new 0 pt. evals) '[]'$  - 0 Comprehensive Assessment (HX, ROS, Risk Assessments, Wounds Hx, etc.) ASSESSMENTS - Wound and Skin Assessment / Reassessment '[]'$  - Dermatologic / Skin Assessment (not related to wound area) 0 ASSESSMENTS - Ostomy and/or Continence Assessment and Care '[]'$  - Incontinence Assessment and Management 0 '[]'$  - 0 Ostomy Care Assessment and Management (repouching, etc.) PROCESS - Coordination of Care '[]'$  - Simple Patient / Family Education for ongoing care 0 '[]'$  - 0 Complex (extensive) Patient / Family Education for ongoing care '[]'$  - 0 Staff obtains Programmer, systems, Records, Test Results / Process Orders '[]'$  - 0 Staff telephones HHA, Nursing Homes / Clarify orders / etc '[]'$  - 0 Routine Transfer to another Facility (non-emergent condition) '[]'$  - 0 Routine Hospital Admission (non-emergent condition) '[]'$  - 0 New Admissions / Biomedical engineer / Ordering NPWT, Apligraf, etc. '[]'$  - 0 Emergency Hospital Admission (emergent condition) PROCESS - Special Needs '[]'$  - Pediatric / Minor Patient Management 0 '[]'$  - 0 Isolation Patient Management '[]'$  - 0 Hearing / Language / Visual special needs '[]'$  - 0 Assessment of Community assistance (transportation, D/C planning, etc.) '[]'$  - 0 Additional assistance / Altered mentation '[]'$  - 0 Support Surface(s) Assessment (bed, cushion, seat, etc.) INTERVENTIONS - Miscellaneous '[]'$  - External ear exam 0 '[]'$  - 0 Patient Transfer (multiple staff / Civil Service fast streamer / Similar devices) '[]'$  - 0 Simple Staple / Suture removal (25 or less) '[]'$  - 0 Complex Staple / Suture removal (26 or more) '[]'$  - 0 Hypo/Hyperglycemic Management (do not check if billed separately) '[]'$  - 0 Ankle / Brachial Index (ABI) - do not check if billed separately Has the patient been seen at the hospital within the last three years: Yes Total Score: 0 Level Of Care: ____ [name] Electronic  Signature(s) Signed: 06/07/2021 3:01:59 PM By: [name] Entered By: [name] on 06/07/2021 14:39:06 [name], [name] -------------------------------------------------------------------------------- Compression Therapy Details Patient  Name: [name], CANN. Date of Service: 06/07/2021 1:15 PM Medical Record Number: [id number] Patient Account Number: [id number] Date of Birth/Sex: 08-09-1946 (75 y.o. F) Treating RN: [name] Primary Care [name]: [name] Other Clinician: Referring [name]: [name] Treating [name]/Extender: [name] in Treatment: 8 Compression Therapy Performed for Wound Assessment: Wound #14 Right Lower Leg Performed By: Clinician Amber Poag, RN Compression Type: Three Layer Post Procedure Diagnosis Same as Pre-procedure Electronic Signature(s) Signed: 06/07/2021 3:01:59 PM By: [name] Entered By: [name] on 06/07/2021 14:32:40 [name], [name] -------------------------------------------------------------------------------- Compression Therapy Details Patient Name: [name], [name]. Date of Service: 06/07/2021 1:15 PM Medical Record Number: [id number] Patient Account Number: [id number] Date of Birth/Sex: 04-29-1946 (75 y.o. F) Treating RN: [name] Primary Care Dyshon Philbin: [name] Other Clinician: Referring [name]: [name] Treating [name]/Extender: [name] in Treatment: 8 Compression Therapy Performed for Wound Assessment: Wound #13 Left,Distal,Posterior Lower Leg Performed By: Amber Argyle, RN Compression Type: Three Layer Post Procedure Diagnosis Same as Pre-procedure Electronic Signature(s) Signed: 06/07/2021 3:01:59 PM By: [name] Entered By: [name] on 06/07/2021 14:32:56 [name], [name] -------------------------------------------------------------------------------- Encounter Discharge Information Details Patient Name:  [name], [name]. Date of Service: 06/07/2021 1:15 PM Medical Record Number: [id number] Patient Account Number: [id number] Date of Birth/Sex: 12/30/1945 (75 y.o. F) Treating RN: [name] Primary Care Bethlehem Langstaff: [name] Other Clinician: Referring Nason Conradt: [name] Treating Laporsha Grealish/Extender: [name] in Treatment: 8 Encounter Discharge Information Items Post Procedure Vitals Discharge Condition: Stable Temperature (F): 96.9 Ambulatory Status: Wheelchair Pulse (bpm): 61 Discharge Destination: Home Respiratory Rate (breaths/min): 16 Transportation: Private Auto Blood Pressure (mmHg): 171/83 Accompanied By: self Schedule Follow-up Appointment: Yes Clinical Summary of Care: Electronic Signature(s) Signed: 06/07/2021 3:01:59 PM By: [name] Entered By: [name] on 06/07/2021 14:56:31 Obi, [name] -------------------------------------------------------------------------------- Lower Extremity Assessment Details Patient Name: [name], [name]. Date of Service: 06/07/2021 1:15 PM Medical Record Number: [id number] Patient Account Number: [id number] Date of Birth/Sex: 1946/01/13 (75 y.o. F) Treating RN: [name] Primary Care Ladiamond Gallina: [name] Other Clinician: Referring Grahm Etsitty: [name] Treating Rekia Kujala/Extender: [name] in Treatment: 8 Edema Assessment Assessed: [Left: Yes] [Right: Yes] [Left: Edema] [Right: :] Calf Left: Right: Point of Measurement: 32 cm From Medial Instep 39 cm 43 cm Ankle Left: Right: Point of Measurement: 10 cm From Medial Instep 22 cm 22.5 cm Vascular Assessment Pulses: Dorsalis Pedis Palpable: [Left:Yes] [Right:Yes] Electronic Signature(s) Signed: 06/07/2021 3:01:59 PM By: [name] Entered By: [name] on 06/07/2021 13:45:14 Basso, Letty Lenna Sciara ([id number]) -------------------------------------------------------------------------------- Multi Wound Chart  Details Patient Name: [name], [name]. Date of Service: 06/07/2021 1:15 PM Medical Record Number: [id number] Patient Account Number: [id number] Date of Birth/Sex: 08-Feb-1946 (75 y.o. F) Treating RN: [name] Primary Care Farrell Pantaleo: [name] Other Clinician: Referring Zachary Nole: [name] Treating Ena Demary/Extender: [name] in Treatment: 8 Vital Signs Height(in): 41 Pulse(bpm): 60 Weight(lbs): 212 Blood Pressure(mmHg): 171/83 Body Mass Index(BMI): 38 Temperature(F): 96.9 Respiratory Rate(breaths/min): 16 Photos: [N/A:N/A] Wound Location: Left, Distal, Posterior Lower Leg Right Lower Leg N/A Wounding Event: Gradually Appeared Gradually Appeared N/A Primary Etiology: Venous Leg Ulcer Venous Leg Ulcer N/A Comorbid History: Cataracts, Asthma, Sleep Apnea, Cataracts, Asthma, Sleep Apnea, N/A Deep Vein Thrombosis, Deep Vein Thrombosis, Hypertension, Peripheral Venous Hypertension, Peripheral Venous Disease, Osteoarthritis, Received Disease, Osteoarthritis, Received Chemotherapy, Received Radiation Chemotherapy, Received Radiation Date Acquired: 02/24/2021 05/05/2021 N/A Weeks of Treatment: 8 1 N/A Wound Status: Open Open N/A Measurements L x W x D (cm) 1.8x1.5x0.1  5x3x0.1 N/A Area (cm) : 2.121 11.781 N/A Volume (cm) : 0.212 1.178 N/A % Reduction in Area: 64.00% 39.40% N/A % Reduction in Volume: 64.00% 39.40% N/A Classification: Full Thickness Without Exposed Full Thickness Without Exposed N/A Support Structures Support Structures Exudate Amount: Medium Medium N/A Exudate Type: Serosanguineous Serosanguineous N/A Exudate Color: red, brown red, brown N/A Granulation Amount: Medium (34-66%) Medium (34-66%) N/A Granulation Quality: Pink Red, Pink N/A Necrotic Amount: Medium (34-66%) Medium (34-66%) N/A Exposed Structures: Fat Layer (Subcutaneous Tissue): Fat Layer (Subcutaneous Tissue): N/A Yes Yes Fascia: No Fascia: No Tendon: No Tendon:  No Muscle: No Muscle: No Joint: No Joint: No Bone: No Bone: No Epithelialization: None None N/A Debridement: Debridement - Excisional N/A N/A Pre-procedure Verification/Time 14:36 N/A N/A Out Taken: Pain Control: Lidocaine N/A N/A Tissue Debrided: Subcutaneous, Slough N/A N/A Level: Skin/Subcutaneous Tissue N/A N/A Debridement Area (sq cm): 2.7 N/A N/A Instrument: Curette N/A N/A Bleeding: Minimum N/A N/A Hemostasis Achieved: Pressure N/A N/A Procedure was tolerated well N/A N/A CHINMAYI, MULLIS ([id number]) Debridement Treatment Response: Post Debridement 1.8x1.5x0.1 N/A N/A Measurements L x W x D (cm) Post Debridement Volume: 0.212 N/A N/A (cm) Procedures Performed: Compression Therapy Compression Therapy N/A Debridement Treatment Notes Electronic Signature(s) Signed: 06/07/2021 2:51:04 PM By: Kalman Shan DO Entered By: Kalman Shan on 06/07/2021 14:40:59 [name], [name] -------------------------------------------------------------------------------- Multi-Disciplinary Care Plan Details Patient Name: [name], [name]. Date of Service: 06/07/2021 1:15 PM Medical Record Number: [id number] Patient Account Number: [id number] Date of Birth/Sex: Jul 02, 1946 (75 y.o. F) Treating RN: [name] Primary Care Teofila Bowery: [name] Other Clinician: Referring Lajada Janes: [name] Treating Lurlene Ronda/Extender: [name] in Treatment: 8 Active Inactive Electronic Signature(s) Signed: 06/07/2021 3:01:59 PM By: [name] Entered By: [name] on 06/07/2021 13:46:00 Bordon, [name] -------------------------------------------------------------------------------- Pain Assessment Details Patient Name: [name], [name]. Date of Service: 06/07/2021 1:15 PM Medical Record Number: [id number] Patient Account Number: [id number] Date of Birth/Sex: [birth year] (75 y.o. F) Treating RN: [name] Primary Care Angelos Wasco: [name]  Other Clinician: Referring Lavarr President: [name] Treating Berdena Cisek/Extender: [name] in Treatment: 8 Active Problems Location of Pain Severity and Description of Pain Patient Has Paino Yes Site Locations Pain Location: Generalized Pain Rate the pain. Current Pain Level: 8 Pain Management and Medication Current Pain Management: Notes c/o sciatic pain and visited hospital for pain Electronic Signature(s) Signed: 06/07/2021 3:01:59 PM By: [name] Entered By: [name] on 06/07/2021 13:34:06 Mclucas, [name] -------------------------------------------------------------------------------- Patient/Caregiver Education Details Patient Name: [name], [name]. Date of Service: 06/07/2021 1:15 PM Medical Record Number: [id number] Patient Account Number: [id number] Date of Birth/Gender: June 13, 1946 (75 y.o. F) Treating RN: [name] Primary Care Physician: [name] Other Clinician: Referring Physician: Ria [name] Treating Physician/Extender: [name] in Treatment: 8 Education Assessment Education Provided To: Patient Education Topics Provided Venous: Wound Debridement: Wound/Skin Impairment: Electronic Signature(s) Signed: 06/07/2021 3:01:59 PM By: [name] Entered By: [name] on 06/07/2021 14:39:37 Murata, Zionna Lenna Sciara ([id number]) -------------------------------------------------------------------------------- Wound Assessment Details Patient Name: [name], MAHORNEY. Date of Service: 06/07/2021 1:15 PM Medical Record Number: [id number] Patient Account Number: [id number] Date of Birth/Sex: [birth year] (75 y.o. F) Treating RN: [name] Primary Care Eternity Dexter: [name] Other Clinician: Referring Cyndy Braver: [name] Treating Nijee Heatwole/Extender: [name] in Treatment: 8 Wound Status Wound Number: 13 Primary Venous Leg Ulcer Etiology: Wound Location: Left, Distal, Posterior Lower Leg Wound  Open Wounding Event: Gradually Appeared Status: Date Acquired: 02/24/2021 Comorbid Cataracts, Asthma, Sleep Apnea, Deep Vein Thrombosis, Weeks Of  Treatment: 8 History: Hypertension, Peripheral Venous Disease, Osteoarthritis, Clustered Wound: No Received Chemotherapy, Received Radiation Photos Wound Measurements Length: (cm) 1.8 Width: (cm) 1.5 Depth: (cm) 0.1 Area: (cm) 2.121 Volume: (cm) 0.212 % Reduction in Area: 64% % Reduction in Volume: 64% Epithelialization: None Tunneling: No Undermining: No Wound Description Classification: Full Thickness Without Exposed Support Structures Exudate Amount: Medium Exudate Type: Serosanguineous Exudate Color: red, brown Foul Odor After Cleansing: No Slough/Fibrino Yes Wound Bed Granulation Amount: Medium (34-66%) Exposed Structure Granulation Quality: Pink Fascia Exposed: No Necrotic Amount: Medium (34-66%) Fat Layer (Subcutaneous Tissue) Exposed: Yes Necrotic Quality: Adherent Slough Tendon Exposed: No Muscle Exposed: No Joint Exposed: No Bone Exposed: No Treatment Notes Wound #13 (Lower Leg) Wound Laterality: Left, Posterior, Distal Cleanser Soap and Water Discharge Instruction: Gently cleanse wound with antibacterial soap, rinse and pat dry prior to dressing wounds Peri-Wound Care MICOLE, SICKMAN ([id number]) Topical Santyl Collagenase Ointment, 30 (gm), tube Discharge Instruction: apply nickel thick to wound bed only Primary Dressing Hydrofera Blue Ready Transfer Foam, 2.5x2.5 (in/in) Discharge Instruction: Apply Hydrofera Blue Ready to wound bed as directed Secondary Dressing ABD Pad 5x9 (in/in) Discharge Instruction: Cover with ABD pad Secured With Compression Wrap Profore Lite LF 3 Multilayer Compression Bandaging System Discharge Instruction: Apply 3 multi-layer wrap as prescribed. Compression Stockings Add-Ons Electronic Signature(s) Signed: 06/07/2021 3:01:59 PM By: [name] Entered By: [name] on  06/07/2021 13:42:28 Eble, [name] -------------------------------------------------------------------------------- Wound Assessment Details Patient Name: SHEALYNN, BLANKEN. Date of Service: 06/07/2021 1:15 PM Medical Record Number: [id number] Patient Account Number: [id number] Date of Birth/Sex: 15-Feb-1946 (75 y.o. F) Treating RN: [name] Primary Care Onita Pfluger: [name] Other Clinician: Referring Mardell Cragg: [name] Treating Xayvion Shirah/Extender: [name] in Treatment: 8 Wound Status Wound Number: 14 Primary Venous Leg Ulcer Etiology: Wound Location: Right Lower Leg Wound Open Wounding Event: Gradually Appeared Status: Date Acquired: 05/05/2021 Comorbid Cataracts, Asthma, Sleep Apnea, Deep Vein Thrombosis, Weeks Of Treatment: 1 History: Hypertension, Peripheral Venous Disease, Osteoarthritis, Clustered Wound: No Received Chemotherapy, Received Radiation Photos Wound Measurements Length: (cm) 5 Width: (cm) 3 Depth: (cm) 0.1 Area: (cm) 11.781 Volume: (cm) 1.178 % Reduction in Area: 39.4% % Reduction in Volume: 39.4% Epithelialization: None Tunneling: No Undermining: No Wound Description Classification: Full Thickness Without Exposed Support Structures Exudate Amount: Medium Exudate Type: Serosanguineous Exudate Color: red, brown Foul Odor After Cleansing: No Slough/Fibrino Yes Wound Bed Granulation Amount: Medium (34-66%) Exposed Structure Granulation Quality: Red, Pink Fascia Exposed: No Necrotic Amount: Medium (34-66%) Fat Layer (Subcutaneous Tissue) Exposed: Yes Necrotic Quality: Adherent Slough Tendon Exposed: No Muscle Exposed: No Joint Exposed: No Bone Exposed: No Treatment Notes Wound #14 (Lower Leg) Wound Laterality: Right Cleanser Soap and Water Discharge Instruction: Gently cleanse wound with antibacterial soap, rinse and pat dry prior to dressing wounds Peri-Wound Care TRACYANN, WASSMUTH  ([id number]) Topical Primary Dressing Silvercel Small 2x2 (in/in) Discharge Instruction: Apply Silvercel Small 2x2 (in/in) as instructed Secondary Dressing ABD Pad 5x9 (in/in) Discharge Instruction: Cover with ABD pad Secured With Coban Cohesive Bandage 4x5 (yds) Stretched Discharge Instruction: Apply Coban as directed. Kerlix Roll Sterile or Non-Sterile 6-ply 4.5x4 (yd/yd) Discharge Instruction: Apply Kerlix as directed Compression Wrap Compression Stockings Add-Ons Electronic Signature(s) Signed: 06/07/2021 3:01:59 PM By: [name] Entered By: [name] on 06/07/2021 13:43:21 Novakovich, Andera Lenna Sciara ([id number]) -------------------------------------------------------------------------------- Vitals Details Patient Name: FIDENCIA, LURA. Date of Service: 06/07/2021 1:15 PM Medical Record Number: [id number] Patient Account Number: [id number] Date of Birth/Sex: Apr 20, 1946 (75 y.o. F) Treating RN: [name] Primary Care  Delberta Folts: [name] Other Clinician: Referring Ezel Vallone: [name] Treating Deara Bober/Extender: [name] in Treatment: 8 Vital Signs Time Taken: 13:31 Temperature (F): 96.9 Height (in): 63 Pulse (bpm): 60 Weight (lbs): 212 Respiratory Rate (breaths/min): 16 Body Mass Index (BMI): 37.6 Blood Pressure (mmHg): 171/83 Reference Range: 80 - 120 mg / dl Electronic Signature(s) Signed: 06/07/2021 3:01:59 PM By: [name] Entered ByDonnamarie [name] on 06/07/2021 13:33:44

## 2021-06-07 NOTE — Progress Notes (Signed)
SHIMEKA, ACORS ([id number]) Visit Report for 06/07/2021 Chief Complaint Document Details Patient Name: [name], [name]. Date of Service: 06/07/2021 1:15 PM Medical Record Number: [id number] Patient Account Number: [id number] Date of Birth/Sex: Apr 02, 1946 (75 y.o. F) Treating RN: Donnamarie Poag Primary Care Provider: [name] Other Clinician: Referring Provider: [name] Treating Provider/Extender: Yaakov Guthrie in Treatment: 8 Information Obtained from: Patient Chief Complaint Left calf venous stasis ulceration. 11/13/16; the patient is here for a left calf recurrent ulceration which is painful and has been present for the last month 01/22/17; the patient re-presents here for recurrent difficulties with blistering and leaking fluid on her left posterior calf 12/10/18; the patient returns to clinic for a wound on the left medial calf 04/12/2021; left lower extremity wound, 05/31/21; right leg wound Electronic Signature(s) Signed: 06/07/2021 2:51:04 PM By: Kalman Shan DO Entered By: Kalman Shan on 06/07/2021 [place], [place] ([id number]) -------------------------------------------------------------------------------- Debridement Details Patient Name: [name]. Date of Service: 06/07/2021 1:15 PM Medical Record Number: [id number] Patient Account Number: [id number] Date of Birth/Sex: 06/16/1946 (75 y.o. F) Treating RN: Donnamarie Poag Primary Care Provider: [name] Other Clinician: Referring Provider: [name] Treating Provider/Extender: Yaakov Guthrie in Treatment: 8 Debridement Performed for Wound #13 Left,Distal,Posterior Lower Leg Assessment: Performed By: Physician Kalman Shan, MD Debridement Type: Debridement Severity of Tissue Pre Debridement: Fat layer exposed Level of Consciousness (Pre- Awake and Alert procedure): Pre-procedure Verification/Time Out Yes - 14:36 Taken: Start Time: 14:36 Pain Control:  Lidocaine Total Area Debrided (L x W): 1.8 (cm) x 1.5 (cm) = 2.7 (cm) Tissue and other material Viable, Non-Viable, Slough, Subcutaneous, Slough debrided: Level: Skin/Subcutaneous Tissue Debridement Description: Excisional Instrument: Curette Bleeding: Minimum Hemostasis Achieved: Pressure End Time: 14:38 Response to Treatment: Procedure was tolerated well Level of Consciousness (Post- Awake and Alert procedure): Post Debridement Measurements of Total Wound Length: (cm) 1.8 Width: (cm) 1.5 Depth: (cm) 0.1 Volume: (cm) 0.212 Character of Wound/Ulcer Post Debridement: Improved Severity of Tissue Post Debridement: Fat layer exposed Post Procedure Diagnosis Same as Pre-procedure Electronic Signature(s) Signed: 06/07/2021 2:51:04 PM By: Kalman Shan DO Signed: 06/07/2021 3:01:59 PM By: Donnamarie Poag Entered By: Donnamarie Poag on 06/07/2021 14:38:15 [place], [name] -------------------------------------------------------------------------------- HPI Details Patient Name: Amber, [name]. Date of Service: 06/07/2021 1:15 PM Medical Record Number: [id number] Patient Account Number: [id number] Date of Birth/Sex: August 08, 1946 (75 y.o. F) Treating RN: Donnamarie Poag Primary Care Provider: [name] Other Clinician: Referring Provider: [name] Treating Provider/Extender: Yaakov Guthrie in Treatment: 8 History of Present Illness HPI Description: Pleasant 75 year old with history of chronic venous insufficiency. No diabetes or peripheral vascular disease. Left ABI 1.29. Questionable history of left lower extremity DVT. She developed a recurrent ulceration on her left lateral calf in December 2015, which she attributes to poor diet and subsequent lower extremity edema. She underwent endovenous laser ablation of her left greater saphenous vein in 2010. She underwent laser ablation of accessory branch of left GSV in April 2016 by Dr. Kellie Simmering at [facility]. She was  previously wearing Unna boots, which she tolerated well. Tolerating 2 layer compression and cadexomer iodine. She returns to clinic for follow-up and is without new complaints. She denies any significant pain at this time. She reports persistent pain with pressure. No claudication or ischemic rest pain. No fever or chills. No drainage. READMISSION 11/13/16; this is a 75 year old woman who is not a diabetic. She is here for a review of a painful area on her left medial lower extremity. I note  that she was seen here previously last year for wound I believe to be in the same area. At that time she had undergone previously a left greater saphenous vein ablation by Dr. Kellie Simmering and she had a ablation of the anterior accessory branch of the left greater saphenous vein in March 2016. Seeing that the wound actually closed over. In reviewing the history with her today the ulcer in this area has been recurrent. She describes a biopsy of this area in 2009 that only showed stasis physiology. 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 Do not see that she has ever had arterial studies in the left leg. She tells me she has a follow-up with Dr. Kellie Simmering in roughly 10 days In any case she developed the reopening of this area roughly a month ago. On the background of this she describes rapidly increasing edema which has responded to Lasix 40 mg and metolazone 2.5 mg as well as the patient's lymph massage. She has been told she has both venous insufficiency and lymphedema but she cannot tolerate compression stockings 11/28/16; the patient saw Dr. Kellie Simmering recently. Per the patient he did arterial Dopplers in the office that did not show evidence of arterial insufficiency, per the patient he stated "treat this  like an ordinary venous ulcer". She also saw her dermatologist Dr. Ronnald Ramp who felt that this was more of a vascular ulcer. In general things are improving although she arrives today with increasing bilateral lower extremity edema with weeping a deeper fluid through the wound on the left medial leg compatible with some degree of lymphedema 12/04/16; the patient's wound is fully epithelialized but I don't think fully healed. We will do another week of depression with Promogran and TCA however I suspect we'll be able to discharge her next week. This is a very unusual-looking wound which was initially a figure-of-eight type wound lying on its side surrounded by petechial like hemorrhage. She has had venous ablation on this side. She apparently does not have an arterial issue per Dr. Kellie Simmering. She saw her dermatologist thought it was "vascular". Patient is definitely going to need ongoing compression and I talked about this with her today she will go to elastic therapy after she leaves here next week 12/11/16; the patient's wound is not completely closed today. She has surrounding scar tissue and in further discussion with the patient it would appear that she had ulcers in this area in 2009 for a prolonged period of time ultimately requiring a punch biopsy of this area that only showed venous insufficiency. I did not previously pickup on this part of the history from the patient. 12/18/16; the patient's wound is completely epithelialized. There is no open area here. She has significant bilateral venous insufficiency with secondary lymphedema to a mild-to-moderate degree she does not have compression stockings.. She did not say anything to me when I was in the room, she told our intake nurse that she was still having pain in this area. This isn't unusual recurrent small open area. She is going to go to elastic therapy to obtain compression stockings. 12/25/16; the patient's wound is fully epithelialized. There is  no open area here. The patient describes some continued episodic discomfort in this area medial left calf. However everything looks fine and  healed here. She is been to elastic therapy and caught herself 15-20 mmHg stockings, they apparently were having trouble getting 20-30 mm stockings in her size 01/22/17; this is a patient we discharged from the clinic a month ago. She has a recurrent open wound on her medial left calf. She had 15 mm support stockings. I told her I thought she needed 20-30 mm compression stockings. She tells me that she has been ill with hospitalization secondary to asthma and is been found to have severe hypokalemia likely secondary to a combination of Lasix and metolazone. This morning she noted blistering and leaking fluid on the posterior part of her left leg. She called our intake nurse urgently and we was saw her this afternoon. She has not had any real discomfort here. I don't know that she's been wearing any stockings on this leg for at least 2-3 days. ABIs in this clinic were 1.21 on the right and 1.3 on the left. She is previously seen vascular surgery who does not think that there is a peripheral arterial issue. 01/30/17; Patient arrives with no open wound on the left leg. She has been to elastic therapy and obtained 20-23mhg below knee stockings and she has one on the right leg today. READMISSION 02/19/18; this PZastoupilis a now 75year old patient we've had in this clinic perhaps 3 times before. I had last looked at her from January 07 December 2016 with an area on the medial left leg. We discharged her on 12/25/16 however she had to be readmitted on 01/22/17 with a recurrence. I have in my notes that we discharged her on 20-30 mm stockings although she tells me she was only wearing support hose because she cannot get stockings on predominantly related to her cervical spine surgery/issues. She has [place] on the left with an anterior accessory branch ablation I think both of these were in 2016. On one of the previous visit she had a biopsy noted 2009 that was negative. She is not felt to have an arterial issue. She is not a diabetic. She does have a history of obstructive sleep apnea hypertension asthma as well as chronic venous insufficiency and lymphedema. On this occasion she noted 2 dry scaly patch on her left leg. She tried to put lotion on this it didn't really help. There were 2 open areas.the patient has been seeing her primary physician from 02/05/18 through 02/14/18. She had Unna boots applied. The superior wound now on the lateral left leg has closed but she's had one wound that remains open on the lateral left leg. This is not the same spot as we dealt with in 2018. ABIs in this clinic were 1.3 bilaterally PTRYPHENA, LAUMAN([id number] 02/26/18; patient has a small wound on the left lateral calf. Dimensions are down. She has chronic venous insufficiency and lymphedema. 03/05/18; small open area on the left lateral calf. Dimensions are down. Tightly adherent necrotic debris over the surface of the wound which was difficult to remove. Also the dressing [over collagen] stuck to the wound surface. This was removed with some difficulty as well. Change the primary dressing to Hydrofera Blue ready 03/12/18; small open area on the left lateral calf. Comes in with tightly adherent surface eschar as well as some adherent Hydrofera Blue. 03/19/18; open area on the left lateral calf. Again adherent surface eschar as well as some adherent Hydrofera Blue nonviable subcutaneous tissue. She complained  of pain all week even with the reduction from 4-3 layer compression I put on last week. Also she had an increase in her ankle and calf measurements probably related to the same thing. 03/26/18; open area on the left lateral calf. A very small open area remains here. We used silver alginate  starting last week as the Hydrofera Blue seem to stick to the wound bed. In using 4-layer compression 04/02/18; the open area in the left lateral calf at some adherent slough which I removed there is no open area here. We are able to transition her into her own compression stocking. Truthfully I think this is probably his support hose. However this does not maintain skin integrity will be limited. She cannot put over the toe compression stockings on because of neck problems hand problems etc. She is allergic to the lining layer of juxta lites. We might be forced to use extremitease stocking should this fail READMIT 11/24/2018 Patient is now a 75 year old woman who is not a diabetic. She has been in this clinic on at least 3 previous occasions largely with recurrent wounds on her left leg secondary to chronic venous insufficiency with secondary lymphedema. Her situation is complicated by inability to get stockings on and an allergy to neoprene which is apparently a component and at least juxta lites and other stockings. As a result she really has not been wearing any stockings on her legs. She tells [place] that roughly 2 or 3 weeks ago she started noticing a stinging sensation just above her ankle on the left medial aspect. She has been diagnosed with pseudogout and she wondered whether this was what she was experiencing. She tried to dress this with something she bought at the store however subsequently it pulled skin off and now she has an open wound that is not improving. She has been using Vaseline gauze with a cover bandage. She saw her primary doctor last week who put an Haematologist on her. ABIs in this clinic was 1.03 on the left 2/12; the area is on the left medial ankle. Odd-looking wound with what looks to be surface epithelialization but a multitude of small petechial openings. This clearly not closed yet. We have been using silver alginate under 3 layer compression with TCA 2/19; the wound area  did not look quite as good this week. Necrotic debris over the majority of the wound surface which required debridement. She continues to have a multitude of what looked to be small petechial openings. She reminds [place] that she had a biopsy on this initially during her first outbreak in 2015 in [place] dermatology. She expresses concern about this being a possible melanoma. 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 I assured her that this does not look anything like melanoma. Except for the petechial reaction it does look like a venous insufficiency area and she certainly has evidence of this on both sides 2/26; a difficult area on the left medial ankle. The patient clearly has chronic venous hypertension with some degree of lymphedema. The odd thing about the area is the small petechial hemorrhages. I am not really sure how to explain this. This was present last time and this is not a compression injury. We have been using Hydrofera Blue which I changed to last week 3/4; still using Hydrofera Blue. Aggressive debridement today. She does not have known arterial issues. She has seen Dr. Kellie Simmering at [facility] vein  and vascular and and has an ablation on the left. [Anterior accessory branch of the greater saphenous]. From what I remember they did not feel she had an arterial issue. The patient has had this area biopsied in 2009 at [facility] dermatology and by her recollection they said this was "stasis". She is also follow-up with dermatology locally who thought that this was more of a vascular issue 3/11; using Hydrofera Blue. Aggressive debridement today. She does not have an arterial issue. We are using 3 layer compression although we may need to go to 4. The patient has been in for multiple changes to her wrap since I last saw her a week ago. She says that the area was leaking. I do not have too much more  information on what was found 01/19/19 on evaluation today patient was actually being seen for a nurse visit when unfortunately she had the area on her left lateral lower extremity as well as weeping from the right lower extremity that became apparent. Therefore we did end up actually seeing her for a full visit with myself. She is having some pain at this site as well but fortunately nothing too significant at this point. No fevers, chills, nausea, or vomiting noted at this time. 3/18-Patient is back to the clinic with the left leg venous leg ulcer, the ulcer is larger in size, has a surface that is densely adherent with fibrinous tissue, the Hydrofera Blue was used but is densely adherent and there was difficulty in removing it. The right lower extremity was also wrapped for weeping edema. Patient has a new area over the left lateral foot above the malleolus that is small and appears to have no debris with intact surrounding skin. Patient is on increased dose of Lasix also as a means to edema management 3/25; the patient has a nonhealing venous ulcer on the medial left leg and last week developed a smaller area on the lateral left calf. We have been using Hydrofera Blue with a contact layer. 4/1; no major change in these wounds areas. Left medial and more recently left lateral calf. I tried Iodoflex last week to aid in debridement she did not tolerate this. She stated her pain was terrible all week. She took the top layer of the 4 layer compression off. 4/8; the patient actually looks somewhat better in terms of her more prominent left lateral calf wound. There is some healthy looking tissue here. She is still complaining of a lot of discomfort. 4/15; patient in a lot of pain secondary to sciatica. She is on a prednisone taper prescribed by her primary physician. She has the 2 areas one on the left medial and more recently a smaller area on the left lateral calf. Both of these just above the  malleoli 4/22; her back pain is better but she still states she is very uncomfortable and now feels she is intolerant to the [employer]. No real change in the wounds we have been using Sorbact. She has been previously intolerant to Iodoflex. There is not a lot of option about what we can use to debride this wound under compression that she no doubt needs. sHe states Ultram no longer works for her pain 4/29; no major change in the wounds slightly increased depth. Surface on the original medial wound perhaps somewhat improved however the more recent area on the lateral left ankle is 100% covered in very adherent debris we have been using Sorbact. She tolerates 4 layer compression well and her edema  control is a lot better. She has not had to come in for a nurse check 5/6; no major change in the condition of the wounds. She did consent to debridement today which was done with some difficulty. Continuing Sorbact. She did not tolerate Iodoflex. She was in for a check of her compression the day after we wrapped her last week this was adjusted but nothing much was found 5/13; no major change in the condition or area of the wounds. I was able to get a fairly aggressive debridement done on the lateral left leg wound. Even using Sorbact under compression. She came back in on Friday to have the wrap changed. She says she felt uncomfortable on the lateral aspect of her ankle. She has a long history of chronic venous insufficiency including previous ablation surgery on this side. 5/20-Patient returns for wounds on left leg with both wounds covered in slough, with the lateral leg wound larger in size, she has been in 3 layer compression and felt more comfortable, she describes pain in ankle, in leg and pins and needles in foot, and is about to try Pamelor for this 6/3; wounds on the left lateral and left medial leg. The area medially which is the most recent of the 2 seems to have had the largest increase  in [place], ADYSON STANISLAWSKI. ([id number]) dimensions. We have been using Sorbac to try and debride the surface. She has been to see orthopedics they apparently did a plain x-ray that was indeterminant. Diagnosed her with neuropathy and they have ordered an MRI to determine if there is underlying osteomyelitis. This was not high on my thought list but I suppose it is prudent. We have advised her to make an appointment with vein and vascular in [place]. She has a history of a left greater saphenous and accessory vein ablations I wonder if there is anything else that can be done from a surgical point of view to help in these difficult refractory wounds. We have previously healed this wound on one occasion but it keeps on reopening [medial side] 6/10; deep tissue culture I did last week I think on the left medial wound showed both moderate E. coli and moderate staph aureus [MSSA]. She is going to require antibiotics and I have chosen Augmentin. We have been using Sorbact and we have made better looking wound surface on both sides but certainly no improvement in wound area. She was back in last Friday apparently for a dressing changes the wrap was hurting her outer left ankle. She has not managed to get a hold of vein and vascular in [place]. We are going to have to make her that appointment 6/17; patient is tolerating the Augmentin. She had an MRI that I think was ordered by orthopedic surgeon this did not show osteomyelitis or an abscess did suggest cellulitis. We have been using Sorbact to the lateral and medial ankles. We have been trying to arrange a follow-up appointment with vein and vascular in [place] or did her original ablations. We apparently an area sent the request to vein and vascular in [facility] 6/24; patient has completed the Augmentin. We do not yet have a vein and vascular appointment in [place]. I am not sure what the issue is here we have asked her to call tomorrow. We are using  Sorbact. Making some improvements and especially the medial wound. Both surfaces however look better medial and lateral. 7/1; the patient has been in contact with vein and vascular in [place] but has not  yet received an appointment. Using Sorbact we have gradually improve the wound surface with no improvement in surface area. She is approved for Apligraf but the wound surface still is not completely viable. She has not had to come in for a dressing change 7/8; the patient has an appointment with vein and vascular on 7/31 which is a Friday afternoon. She is concerned about getting back here for [place] to dress her wounds. I think it is important to have them goal for her venous reflux/history of ablations etc. to see if anything else can be done. She apparently tested positive for 1 of the blood tests with regards to lupus and saw a rheumatologist. He has raised the issue of vasculitis again. I have had this thought in the past however the evidence seems overwhelming that this is a venous reflux etiology. If the rheumatologist tells me there is clinical and laboratory investigation is positive for lupus I will rethink this. 7/15; the patient's wound surfaces are quite a bit better. The medial area which was her original wound now has no depth although the lateral wound which was the more recent area actually appears larger. Both with viable surfaces which is indeed better. Using Sorbact. I wanted to use Apligraf on her however there is the issue of the vein and vascular appointment on 7/31 at 2:00 in the afternoon which would not allow her to get back to be rewrapped and they would no doubt remove the graft 7/22; the patient's wound surfaces have moderate amount of debris although generally look better. The lateral one is larger with 2 small satellite areas superiorly. We are waiting for her vein and vascular appointment on 7/31. She has been approved for Apligraf which I would like to use after  th 7/29; wound surfaces have improved no debridement is required we have been using Sorbact. She sees vein and vascular on Friday with this so question of whether anything can be done to lessen the likelihood of recurrence and/or speed the healing of these areas. She is already had previous ablations. She no doubt has severe venous hypertension 8/5-Patient returns at 1 week, she was in [place] for 3 days by her podiatrist, we have been using so backed to the wound, she has increased pain in both the wounds on the left lower leg especially the more distal one on the lateral aspect 8/12-Patient returns at 1 week and she is agreeable to having debridement in both wounds on her left leg today. We have been using Sorbact, and vascular studies were reviewed at last visit 8/19; the patient arrives with her wounds fairly clean and no debridement is required. We have used Sorbact which is really done a nice job in cleaning up these very difficult wound surfaces. The patient saw Dr. Donzetta Matters of vascular surgery on 7/31. He did not feel that there was an arterial component. He felt that her treated greater saphenous vein is adequately addressed and that the small saphenous vein did not appear to be involved significantly. She was also noted to have deep venous reflux which is not treatable. Dr. Donzetta Matters mentioned the possibility of a central obstructive component leading to reflux and he offered her central venography. She wanted to discuss this or think about it. I have urged her to go ahead with this. She has had recurrent difficult wounds in these areas which do heal but after months in the clinic. If there is anything that can be done to reduce the likelihood of this I  think it is worth it. 9/2 she is still working towards getting follow-up with Dr. Donzetta Matters to schedule her CT. Things are quite a bit worse venography. I put Apligraf on 2 weeks ago on both wounds on the medial and lateral part of her left lower leg.  She arrives in clinic today with 3 superficial additional wounds above the area laterally and one below the wound medially. She describes a lot of discomfort. I think these are probably wrapped injuries. Does not look like she has cellulitis. 07/20/2019 on evaluation today patient appears to be doing somewhat poorly in regard to her lower extremity ulcers. She in fact showed signs of erythema in fact we may even be dealing with an infection at this time. Unfortunately I am unsure if this is just infection or if indeed there may be some allergic reaction that occurred as a result of the Apligraf application. With that being said that would be unusual but nonetheless not impossible in this patient is one who is unfortunately allergic to quite a bit. Currently we have been using the Sorbact which seems to do as well as anything for her. I do think we may want to obtain a culture today to see if there is anything showing up there that may need to be addressed. 9/16; noted that last week the wounds look worse in 1 week follow-up of the Apligraf. Using Sorbact as of 2 days ago. She arrives with copious amounts of drainage and new skin breakdown on the back of the left calf. The wounds arm more substantial bilaterally. There is a fair amount of swelling in the left calf no overt DVT there is edema present I think in the left greater than right thigh. She is supposed to go on 9/28 for CT venography. The wounds on the medial and lateral calf are worse and she has new skin breakdown posteriorly at least new for me. This is almost developing into a circumferential wound area The Apligraf was taken off last week which I agree with things are not going in the right direction a culture was done we do not have that back yet. She is on Augmentin that she started 2 days ago 9/23; dressing was changed by her nurses on Monday. In general there is no improvement in the wound areas although the area looks less angry than  last week. She did get Augmentin for MSSA cultured on the 14th. She still appears to have too much swelling in the left leg even with 3 layer compression 9/30; the patient underwent her procedure on 9/28 by Dr. Donzetta Matters at [facility]. She was discovered to have the common iliac vein measuring 12.2 mm but at the level of L4-L5 measured 3 mm. After stenting it measured 10 mm. 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 Rouleaux flow in the common femoral and femoral vein was observed much improved after stenting. We are using silver alginate to the wounds on the medial and lateral ankle on the left. 4 layer compression 10/7; the patient had fluid swelling around her knee and 4 layer compression. At the advice of vein and vascular this was reduced to 3 layer which she is tolerating better. We have been using silver alginate under 3 layer compression since last Friday 10/14; arrives with the areas on the left ankle looking a lot better. Inflammation in the area also a lot better. She came in for a nurse check on 10/9 10/21; continued nice improvement. Slight  improvements in surface area of both the medial and lateral wounds on the left. A lot of the satellite lesions in the weeping erythema around these from stasis dermatitis is resolved. We have been using silver alginate YEJIN, FAUSNAUGH ([id number]) 10/28; general improvement in the entire wound areas although not a lot of change in dimensions the wound certainly looks better. There is a lot less in terms of venous inflammation. Continue silver alginate this week however look towards Hydrofera Blue next week 11/4; very adherent debris on the medial wound left wound is not as bad. We have been using silver alginate. Change to [facility] today 11/11; very adherent debris on both wound areas. She went to vein and vascular last week and follow-up they put in [place] boot on this today. He says the [facility] was adherent.  Wound is definitely not as good as last week. Especially on the left there the satellite lesions look more prominent 11/18; absolutely no better. erythema on lateral aspect with tenderness. 09/30/2019 on evaluation today patient appears to actually be doing better. Dr. Dellia Nims did put her on doxycycline last week which I do believe has helped her at this point. Fortunately there is no signs of active infection at this time. No fevers, chills, nausea, vomiting, or diarrhea. I do believe he may want extend the doxycycline for 7 additional days just to ensure everything does completely cleared up the patient is in agreement with that plan. Otherwise she is going require some sharp debridement today 12/2; patient is completing a 2-week course of doxycycline. I gave her this empirically for inflammation as well as infection when I last saw her 2 weeks ago. All of this seems to be better. She is using silver alginate she has the area on the medial aspect of the larger area laterally and the 2 small satellite regions laterally above the major wound. 12/9; the patient's wound on the left medial and left lateral calf look really quite good. We have been using silver alginate. She saw vein and vascular in follow-up on 10/09/2019. She has had a previous left greater saphenous vein ablation by Dr. Oscar La in 2016. More recently she underwent a left common iliac vein stent by Dr. 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 The swelling is improved and certainly the wounds have improved. The patient shows [place] today area on the right medial calf there is almost no wound but leaking lymphedema. She says she start this started 3 or 4 days ago. She did not traumatize it. It is not painful. She does not wear compression on that side 12/16; the patient continues to do well laterally. Medially still requiring debridement. The area on the right calf did not materialize to anything and is not currently open. We wrapped  this last time. She has support stockings for that leg although I am not sure they are going to provide adequate compression 12/23; the lateral wound looks stable. Medially still requiring debridement for tightly adherent fibrinous debris. We've been using silver alginate. Surface area not any different 12/30; neither wound is any better with regards to surface and the area on the left lateral is larger. I been using silver alginate to the left lateral which look quite good last week and Sorbact to the left medial 11/11/2019. Lateral wound area actually looks better and somewhat smaller. Medial still requires a very aggressive debridement today. We have been using Sorbact on both wound areas 1/13; not much better still adherent debris bilaterally.  I been using Sorbact. She has severe venous hypertension. Probably some degree of dermal fibrosis distally. I wonder whether tighter compression might help and I am going to try that today. We also need to work on the bioburden 1/20; using Sorbact. She has severe venous hypertension status post stent placement for pelvic vein compression. We applied gentamicin last time to see if we could reduce bioburden I had some discussion with her today about the use of pentoxifylline. This is occasionally used in this setting for wounds with refractory venous insufficiency. However this interacts with Plavix. She tells me that she was put on this after stent placement for 3 months. She will call Dr. Claretha Cooper office to discuss 1/27; we are using gentamicin under Sorbact. 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 She has a stent. Wound medially is measuring smaller this week. Laterally measuring slightly larger although she has some satellite lesions superiorly 2/3; gentamicin under Sorbact under 4-layer compression. 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 She has a stent on Plavix. Her wounds are measuring smaller  this week. More substantially laterally where there is a satellite lesion superiorly. 2/10; gentamicin under Sorbac. 4-layer compression. Patient communicated with Dr. Donzetta Matters at vein and vascular in [place]. He is okay with the patient coming off Plavix I will therefore start her on pentoxifylline for a 1 month trial. In general her wounds look better today. I had some concerns about swelling in the left thigh however she measures 61.5 on the right and 63 on the mid thigh which does not suggest there is any difficulty. The patient is not describing any pain. 2/17; gentamicin under Sorbac 4-layer compression. She has been on pentoxifylline for 1 week and complains of loose stool. No nausea she is eating and drinking well 2/24; the patient apparently came in 2 days ago for a nurse visit when her wrap fell down. Both areas look a little worse this week macerated medially and satellite lesions laterally. Change to silver alginate today 3/3; wounds are larger today especially medially. She also has more swelling in her foot lower leg and I even noted some swelling in her posterior thigh which is tender. I wonder about the patency of her stent. Fortuitously she sees Dr. Claretha Cooper group on Friday 3/10; Mrs. Veth was seen by vein and vascular on 3/5. The patient underwent ultrasound. There was no evidence of thrombosis involving the IVC no evidence of thrombosis involving the right common iliac vein there is no evidence of thrombosis involving the right external iliac vein the left external vein is also patent. The right common iliac vein stent appears patent bilateral common femoral veins are compressible and appear patent. I was concerned about the left common iliac stent however it looks like this is functional. She has some edema in the posterior thigh that was tender she still has that this week. I also note they had trouble finding the pulses in her left foot and booked her for an ABI baseline in 4  weeks. She will follow up in 6 months for repeat IVC duplex. The patient stopped the pentoxifylline because of diarrhea. It does not look like that was being effective in any case. I have advised her to go back on her aspirin 81 mg tablet, vascular it also suggested this 3/17; comes in today with her wound surfaces a lot better. The excoriations from last week considerably better probably secondary to the TCA. We have been using silver alginate 3/24; comes in today  with smaller wounds both medially and laterally. Both required debridement. There are 2 small satellite areas superiorly laterally. She also has a very odd bandlike area in the mid calf almost looking like there was a weakness in the wrap in a localized area. I would write this off as being this however anteriorly she has a small raised ballotable area that is very tender almost reminiscent of an abscess but there was no obvious purulent surface to it. 02/04/20 upon evaluation today patient appears to be doing fairly well in regard to her wounds today. Fortunately there is no signs of active infection at this time. No fevers, chills, nausea, vomiting, or diarrhea. She has been tolerating the dressing changes without complication. Fortunately I feel like she is showing signs of improvement although has been sometime since have seen her. Nonetheless the area of concern that Dr. Dellia Nims had last week where she had possibly an area of the wrap that was we can allow the leg to bulge appears to be doing significantly better today there is no signs of anything worsening. TANISE, DELORBE ([id number]) 4/7; the patient's wounds on her medial and lateral left leg continue to contract. We have been using a regular alginate. Last week she developed an area on the right medial lower leg which is probably a venous ulcer as well. 4/14; the wounds on her left medial and lateral lower leg continue to contract. Surface eschar. We have been using regular  alginate. The area on the right medial lower leg is closed. We have been putting both legs under 4-layer contraction. The patient went back to see vein and vascular she had arterial studies done which were apparently "quite good" per the patient although I have not read their notes I have never felt she had an arterial issue. 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 This is been longstanding and refractory to exercise, leg elevation and longstanding use of compression wraps in our clinic as well as compression stockings on the times we have been able to get these to heal 4/21; we thought she actually might be close this week however she arrives in clinic with a lot of edema in her upper left calf and into her posterior thigh. This is been an intermittent problem here. She says the wrap fell down but it was replaced with a nurse visit on Monday. We are using calcium alginate to the wounds and the wound sizes there not terribly larger than last week but there is a lot more edema 4/28; again wound edges are smaller on both sides. Her edema is better controlled than last time. She is obtained her compression pumps from [employer] although they have not been to her home to set these up. 5/5; left medial and left lateral both look stable. I am not sure the medial is any smaller. We have been using calcium alginate under 4-layer compression. oShe had an area on the right medial. This was eschared today. We have been wrapping this as well. She does not tolerate external compression stockings due to a history of various contact allergies. She has her compression pumps however the representative from the company is coming on her to show her how to use these tomorrow 5/19; patient with severe chronic venous insufficiency secondary to central venous disease. She had a stent placed in her left common iliac vein. She has done better since but still difficult  to control wounds. She comes in today with nothing open on  the right leg. Her areas on the left medial and left lateral are just about closed. We are using calcium alginate under 4-layer compression. She is using her external compression pumps at home She only has 15-20 support stockings. States she cannot get anything tighter than that on. 03/30/20-Patient returns at 1 week, the wounds on the left leg are both slightly bigger, the last week she was on 3 layer compression which started to slide down. She is starting to use her lymphedema pumps although she stated on 1 day her right ankle started to swell up and she have to stop that day. Unfortunately the open area seem to oscillate between improving to the point of healing and then flaring up all to do with effectiveness of compression or lack of due to the left leg topography not keeping the compression wraps from rolling down 6/2 patient comes in with a 15/20 mmHg stocking on the right leg. She tells me that she developed a lot of swelling in her ankles she saw orthopedics she was felt to possibly be having a flare of pseudogout versus some other type of arthritis. She was put on steroids for a respiratory issue so that helps with the inflammation. She has not been using the pumps all week. She thinks the left thigh is more swollen than usual and I would agree with that. She has an appointment with Dr. Donzetta Matters 9 days or so from now 6/9; both wounds on the left medial and left lateral are smaller. We have been using calcium alginate under compression. She does not have an open wound on the right leg she is using a stocking and her compression pumps things are going well. She has an appointment with Dr. Donzetta Matters with regards to her stent in the left common iliac vein 6/16; the wounds on the left medial and left lateral ankle continues to contract. The patient saw Dr. Donzetta Matters and I think he seems satisfied. Ordered follow-up venous reflux studies on both sides  in September. Cautioned that she may need thigh-high stockings. She has been using calcium alginate under compression on the left and her own stocking on the right leg. She tells [place] there are no open wounds on the right 6/23; left lateral is just about closed. Medial required debridement today. We have been using calcium alginate. Extensive discussion about the compression pumps she is only using these on 25 mmHg states she could not take 40 or 30 when the wrap came out to her home to demonstrate these. He said they should not feel tight 6/30; the left lateral wound has a slight amount of eschar. . The area medially is about the same using Hydrofera Blue. 7/7; left lateral wound still has some eschar. I will remove this next week may be closed. The area medially is very small using Hydrofera Blue with improvement. Unfortunately the stockings fell down. Unfortunately the blisters have developed at the edge of where the wrap fell. When this happened she says her legs hurt she did not use her pumps. We are not open Monday for her to come in and change the wraps and she had an appointment yesterday. She also tells me that she is going to have an MRI of her back. She is having pain radiating into her left anterior leg she thinks her from an L5 disc. She saw Dr. Ellene Route of neurosurgery 7/14; the area on the left lateral ankle area is closed. Still a small area medially however it looks better as well.  We have been using Hydrofera Blue under 4-layer compression 7/21; left lateral ankle is still closed however her wound on the medial left calf is actually larger. This is probably because Hydrofera Blue got stuck to the wound. She came in for a nurse change on Friday and will do that again this week I was concerned about the amount of swelling that she had last week however she is using her compression pumps twice a day and the swelling seems well controlled 7/28; remaining wound on the left medial lower leg  is smaller. We have been using moistened silver collagen under compression she is coming back for a nurse visit. For reasons that were not really clear she was just keeping her legs elevated and not using her compression pumps. I have asked her to use the compression pumps. She does not have any wounds on the right leg 06/15/20-Patient returns at 2 weeks, her LLE edema is worse and she developed a blister wound that is new and has bigger posterior calf wound on right, we are using Prisma with pad, 4 layer compression. she has been on lasix 40 mg daily 8/18; patient arrives today with things a lot worse than I remember from a few weeks ago. She was seen last week. Noted that her edema was worse and that she now had a left lateral wound as well as deteriorating edema in the medial and posterior part of the lower leg. She says she is using his or her external compression pumps once a day although I wonder about the compliance. 8/25; weeping area on the right medial lower leg. This had actually gotten a small localized area of her compression stocking wet. oOn the left side there is a large denuded area on the posterior medial lower leg and smaller area on the lateral. This was not the original areas that we dealt with. 9/1 the patient's wound on the left leg include the left lateral and left posterior. Larger superficial wounds weeping. She has very poor edema control. Tender localized edema in the left lower medial ankle/heel probably because of localized wrap issues. She freely admits she is not using the compression pumps. She has been up on her feet a lot. She thinks the hydrofera blue is contributing to the pain she is experiencing.. This is a complaint that I have occasionally heard 9/8; really not much improvement. The patient is still complaining of a lot of pain particularly when she uses compression pumps. I switched her to silver alginate last time because she found the Hydrofera Blue to be  irritating. I don't hear much difference in her description with the silver alginate. 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 She has follow-up with Dr. Donzetta Matters I think for ultrasound next month MAHARI, BOSHEARS ([id number]) 9/15; quite a bit of improvement today. We have less edema and more epithelialization in both of her wound areas on the left medial and left lateral calf. These are not the site of her original wounds in this area. She says she has been using her compression pumps for 30 minutes twice a day, there pain issues that never quite understood. Silver alginate as the primary dressing 9/22; continued improvement. Both areas medially and laterally still have a small open area there is some eschar. She continues to complain of left medial ankle pain. Swelling in the leg is in much better condition. We have been using  silver alginate 9/29; continued improvement. Both areas medially and laterally in the left calf look as though they are close some minor surface eschar but I think this is epithelialized. She comes in today saying she has a ruptured disc at L4-L5 cannot bend over to put on her stockings. 10/6; patient comes in today with no open wounds on either leg. However her edema on the left leg in the upper one third of the lower leg is poorly controlled nonpitting. She says that she could not use the pumps for 2 days and then she has been using the last couple of days. It is not clear to me she has been able to get her stocking on. She has back problems. Mrs. Allsbrook has severe chronic venous insufficiency with secondary lymphedema. Her venous insufficiency is partially centrally mediated and that she is now post stent in the left common iliac vein. [facility] Thurner's syndrome/physiology]. She follows up with them on 10/15. She wears 20/30 below-knee stockings. She is supposed to use compression pumps at home  although I think her compliance about with this is been less than 100%. I have asked her to use these 3 times a day. Finally I think she has lipodermatosclerosis in the left lower leg with an inverted bottle sign. It is been a major problem controlling the edema in the left leg. The right leg we have had wounds on but not as significant a problem is on the left READMISSION 04/12/2021 Mrs. Danehy is a 75 year old woman we know well in this clinic. She has severe chronic venous insufficiency. 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 I believe she has had bilateral greater saphenous vein ablation in the past as well. She tells me that this wound opened sometime in March. She had a fall and thinks it was initially abrasion. She developed areas she describes as little blisters on the anterior part of her leg and she saw dermatology and was treated for methicillin staph aureus with several rounds of antibiotics. She has been using support stockings on the left leg and says this is the only thing she can get on. Her compression pump use maybe once a day she says if she did not use one she use the other. She comes in today with incredible swelling in the left leg with a wound on the left posterior calf. She has been using Neosporin to this previously a hydrocolloid. 6/15; patient arrives back for 1 week follow-up.Marland Kitchen Apparently her wrap fell down she did not call us to replace this. He has poor edema control. She only uses her compression pumps once a day 6/29; patient presents for 1 week follow-up. She has tolerated the compression wrap well. We have been using Iodoflex under the wrap. She has no issues or complaints today. 7/6; patient presents for 1 week follow-up. She states that the compression wrap rolled down her leg 4 days ago. She has been trying to keep the area covered but has no dressings at home to use. She denies signs of infection. 7/13; patient  presents for 1 week follow-up. She states that her compression wrap rolled down her right leg and she called our office and had it placed a few days ago. She has been tolerating the current wrap well. She states that the Iodoflex is causing a burning sensation. She denies signs of infection. 7/27; patient presents for 1 week follow-up She has tolerated the compression wrap well with Hydrofera Blue  underneath. She has now developed a wound to her right lower extremity. She reports having a culture done To this area by her dermatologist that she reports is negative. She currently denies signs of infection. 8/30; patient presents for 1 week follow-up. On the left side she has tolerated the compression wrap well with Hydrofera Blue underneath. She reports some discomfort to her right lower extremity with the 3 layer compression. She currently denies signs of infection. Electronic Signature(s) Signed: 06/07/2021 2:51:04 PM By: Kalman Shan DO Entered By: Kalman Shan on 06/07/2021 14:43:19 Grahn, [name] -------------------------------------------------------------------------------- Physical Exam Details Patient Name: VERSIE, GETMAN. Date of Service: 06/07/2021 1:15 PM Medical Record Number: [id number] Patient Account Number: [id number] Date of Birth/Sex: October 16, 1946 (76 y.o. F) Treating RN: Donnamarie Poag Primary Care Provider: [name] Other Clinician: Referring Provider: [name] Treating Provider/Extender: Yaakov Guthrie in Treatment: 8 Constitutional . Cardiovascular . Psychiatric . Notes Left lower extremity: To the posterior aspect there is an open wound with Granulation tissue and nonviable tissue present. No signs of infection Right lower extremity: Open wound limited to skin breakdown. No obvious signs of infection. 3+ pitting edema to the knee Electronic Signature(s) Signed: 06/07/2021 2:51:04 PM By: Kalman Shan DO Entered By: Kalman Shan on 06/07/2021 14:48:32 Christine, [name] -------------------------------------------------------------------------------- Physician Orders Details Patient Name: SHARITTA, SAMPLEY. Date of Service: 06/07/2021 1:15 PM Medical Record Number: [id number] Patient Account Number: [id number] Date of Birth/Sex: 1946-09-21 (75 y.o. F) Treating RN: Donnamarie Poag Primary Care Provider: [name] Other Clinician: Referring Provider: [name] Treating Provider/Extender: Yaakov Guthrie in Treatment: 8 Verbal / Phone Orders: No Diagnosis Coding Follow-up Appointments Wound #13 Left,Distal,Posterior Lower Leg o Return Appointment in 2 weeks. o Nurse Visit as needed - nurse visit to rewrap 06/13/21 Bathing/ Shower/ Hygiene o May shower with wound dressing protected with water repellent cover or cast protector. Anesthetic (Use 'Patient Medications' Section for Anesthetic Order Entry) o Lidocaine applied to wound bed Edema Control - Lymphedema / Segmental Compressive Device / Other o Elevate, Exercise Daily and Avoid Standing for Long Periods of Time. o Elevate legs to the level of the heart and pump ankles as often as possible o Elevate leg(s) parallel to the floor when sitting. o Compression Pump: Use compression pump on left lower extremity for 60 minutes, twice daily. - TWICE DAILY for 1 hour o Compression Pump: Use compression pump on right lower extremity for 60 minutes, twice daily. - TWICE DAILY for 1 hour Wound Treatment Wound #13 - Lower Leg Wound Laterality: Left, Posterior, Distal Cleanser: Soap and Water 1 x Per Week/30 Days Discharge Instructions: Gently cleanse wound with antibacterial soap, rinse and pat dry prior to dressing wounds Topical: Santyl Collagenase Ointment, 30 (gm), tube (Home Health) 1 x Per Week/30 Days Discharge Instructions: apply nickel thick to wound bed only Primary Dressing: Hydrofera Blue Ready Transfer Foam,  2.5x2.5 (in/in) (Generic) 1 x Per Week/30 Days Discharge Instructions: Apply Hydrofera Blue Ready to wound bed as directed Secondary Dressing: ABD Pad 5x9 (in/in) 1 x Per Week/30 Days Discharge Instructions: Cover with ABD pad Compression Wrap: Profore Lite LF 3 Multilayer Compression Bandaging System 1 x Per Week/30 Days Discharge Instructions: Apply 3 multi-layer wrap as prescribed. Wound #14 - Lower Leg Wound Laterality: Right Cleanser: Soap and Water 1 x Per Week/30 Days Discharge Instructions: Gently cleanse wound with antibacterial soap, rinse and pat dry prior to dressing wounds Primary Dressing: Silvercel Small 2x2 (in/in) 1 x Per Week/30 Days Discharge  Instructions: Apply Silvercel Small 2x2 (in/in) as instructed Secondary Dressing: ABD Pad 5x9 (in/in) 1 x Per Week/30 Days Discharge Instructions: Cover with ABD pad Secured With: Coban Cohesive Bandage 4x5 (yds) Stretched 1 x Per Week/30 Days Discharge Instructions: Apply Coban as directed. Secured With: [employer] or Non-Sterile 6-ply 4.5x4 (yd/yd) 1 x Per Week/30 Days Discharge Instructions: Apply Kerlix as directed ANTARA, GHUMAN ([id number]) Electronic Signature(s) Signed: 06/07/2021 2:51:04 PM By: Kalman Shan DO Signed: 06/07/2021 3:01:59 PM By: Donnamarie Poag Entered By: Donnamarie Poag on 06/07/2021 14:38:59 Hirano, Senai Lenna Sciara ([id number]) -------------------------------------------------------------------------------- Problem List Details Patient Name: DANIAL, FLIPPIN. Date of Service: 06/07/2021 1:15 PM Medical Record Number: [id number] Patient Account Number: [id number] Date of Birth/Sex: 04-21-1946 (75 y.o. F) Treating RN: Donnamarie Poag Primary Care Provider: [name] Other Clinician: Referring Provider: [name] Treating Provider/Extender: Yaakov Guthrie in Treatment: 8 Active Problems ICD-10 Encounter Code Description Active Date MDM Diagnosis I87.332 Chronic venous hypertension  (idiopathic) with ulcer and inflammation of 04/12/2021 No Yes left lower extremity I89.0 Lymphedema, not elsewhere classified 04/12/2021 No Yes L97.221 Non-pressure chronic ulcer of left calf limited to breakdown of skin 04/12/2021 No Yes S81.801A Unspecified open wound, right lower leg, initial encounter 05/31/2021 No Yes Inactive Problems Resolved Problems Electronic Signature(s) Signed: 06/07/2021 2:51:04 PM By: Kalman Shan DO Entered By: Kalman Shan on 06/07/2021 14:40:49 Pillard, Avarae J. ([id number]) -------------------------------------------------------------------------------- Progress Note/History and Physical Details Patient Name: ANDRICA, ROWLETTE. Date of Service: 06/07/2021 1:15 PM Medical Record Number: [id number] Patient Account Number: [id number] Date of Birth/Sex: 28-Aug-1946 (76 y.o. F) Treating RN: Donnamarie Poag Primary Care Provider: [name] Other Clinician: Referring Provider: [name] Treating Provider/Extender: Yaakov Guthrie in Treatment: 8 Subjective Chief Complaint Information obtained from Patient Left calf venous stasis ulceration. 11/13/16; the patient is here for a left calf recurrent ulceration which is painful and has been present for the last month 01/22/17; the patient re-presents here for recurrent difficulties with blistering and leaking fluid on her left posterior calf 12/10/18; the patient returns to clinic for a wound on the left medial calf 04/12/2021; left lower extremity wound, 05/31/21; right leg wound History of Present Illness (HPI) Pleasant 75 year old with history of chronic venous insufficiency. No diabetes or peripheral vascular disease. Left ABI 1.29. Questionable history of left lower extremity DVT. She developed a recurrent ulceration on her left lateral calf in December 2015, which she attributes to poor diet and subsequent lower extremity edema. She underwent endovenous laser ablation of her left greater saphenous vein in  2010. She underwent laser ablation of accessory branch of left GSV in April 2016 by Dr. Kellie Simmering at [facility]. She was previously wearing Unna boots, which she tolerated well. Tolerating 2 layer compression and cadexomer iodine. She returns to clinic for follow-up and is without new complaints. She denies any significant pain at this time. She reports persistent pain with pressure. No claudication or ischemic rest pain. No fever or chills. No drainage. READMISSION 11/13/16; this is a 75 year old woman who is not a diabetic. She is here for a review of a painful area on her left medial lower extremity. I note that she was seen here previously last year for wound I believe to be in the same area. At that time she had undergone previously a left greater saphenous vein ablation by Dr. Kellie Simmering and she had a ablation of the anterior accessory branch of the left greater saphenous vein in March 2016. Seeing that the wound actually closed over. In reviewing the  history with her today the ulcer in this area has been recurrent. She describes a biopsy of this area in 2009 that only showed stasis physiology. She also has a history of today malignant melanoma in the right shoulder for which she follows with Dr. 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 Do not see that she has ever had arterial studies in the left leg. She tells me she has a follow-up with Dr. Kellie Simmering in roughly 10 days In any case she developed the reopening of this area roughly a month ago. On the background of this she describes rapidly increasing edema which has responded to Lasix 40 mg and metolazone 2.5 mg as well as the patient's lymph massage. She has been told she has both venous insufficiency and lymphedema but she cannot tolerate compression stockings 11/28/16; the patient saw Dr. Kellie Simmering recently. Per the patient he did  arterial Dopplers in the office that did not show evidence of arterial insufficiency, per the patient he stated "treat this like an ordinary venous ulcer". She also saw her dermatologist Dr. Ronnald Ramp who felt that this was more of a vascular ulcer. In general things are improving although she arrives today with increasing bilateral lower extremity edema with weeping a deeper fluid through the wound on the left medial leg compatible with some degree of lymphedema 12/04/16; the patient's wound is fully epithelialized but I don't think fully healed. We will do another week of depression with Promogran and TCA however I suspect we'll be able to discharge her next week. This is a very unusual-looking wound which was initially a figure-of-eight type wound lying on its side surrounded by petechial like hemorrhage. She has had venous ablation on this side. She apparently does not have an arterial issue per Dr. Kellie Simmering. She saw her dermatologist thought it was "vascular". Patient is definitely going to need ongoing compression and I talked about this with her today she will go to elastic therapy after she leaves here next week 12/11/16; the patient's wound is not completely closed today. She has surrounding scar tissue and in further discussion with the patient it would appear that she had ulcers in this area in 2009 for a prolonged period of time ultimately requiring a punch biopsy of this area that only showed venous insufficiency. I did not previously pickup on this part of the history from the patient. 12/18/16; the patient's wound is completely epithelialized. There is no open area here. She has significant bilateral venous insufficiency with secondary lymphedema to a mild-to-moderate degree she does not have compression stockings.. She did not say anything to me when I was in the room, she told our intake nurse that she was still having pain in this area. This isn't unusual recurrent small open area. She is going  to go to elastic therapy to obtain compression stockings. 12/25/16; the patient's wound is fully epithelialized. There is no open area here. The patient describes some continued episodic discomfort in this area medial left calf. However everything looks fine and healed here. She is been to elastic therapy and caught herself 15-20 mmHg stockings, they apparently were having trouble getting 20-30 mm stockings in her size 01/22/17; this is a patient we discharged from the clinic a month ago. She has a recurrent open wound on her medial left calf. She had 15 mm support stockings. I told her I thought she needed  20-30 mm compression stockings. She tells me that she has been ill with hospitalization secondary to asthma and is been found to have severe hypokalemia likely secondary to a combination of Lasix and metolazone. This morning she noted blistering and leaking fluid on the posterior part of her left leg. She called our intake nurse urgently and we was saw her this afternoon. She has not had any real discomfort here. I don't know that she's been wearing any stockings on this leg for at least 2-3 days. ABIs in this clinic were 1.21 on the right and 1.3 on the left. She is previously seen vascular surgery who does not think that there is a peripheral arterial issue. 01/30/17; Patient arrives with no open wound on the left leg. She has been to elastic therapy and obtained 20-25mhg below knee stockings and she has one on the right leg today. READMISSION 02/19/18; this [place] a now 75year old patient we've had in this clinic perhaps 3 times before. I had last looked at her from January 07 December 2016 with an area on the medial left leg. We discharged her on 12/25/16 however she had to be readmitted on 01/22/17 with a recurrence. I have in my notes that we discharged her on 20-30 mm stockings although she tells me she was only wearing support hose because she cannot get stockings on predominantly related  to her cervical spine surgery/issues. She has [place] on the left with an anterior accessory branch ablation I think both of these were in 2016. On one of the previous visit she had a biopsy noted 2009 that was negative. She is not felt to have an arterial issue. She is not a diabetic. She does have a PSAKSHI, GOODBAR ([id number] history of obstructive sleep apnea hypertension asthma as well as chronic venous insufficiency and lymphedema. On this occasion she noted 2 dry scaly patch on her left leg. She tried to put lotion on this it didn't really help. There were 2 open areas.the patient has been seeing her primary physician from 02/05/18 through 02/14/18. She had Unna boots applied. The superior wound now on the lateral left leg has closed but she's had one wound that remains open on the lateral left leg. This is not the same spot as we dealt with in 2018. ABIs in this clinic were 1.3 bilaterally 02/26/18; patient has a small wound on the left lateral calf. Dimensions are down. She has chronic venous insufficiency and lymphedema. 03/05/18; small open area on the left lateral calf. Dimensions are down. Tightly adherent necrotic debris over the surface of the wound which was difficult to remove. Also the dressing [over collagen] stuck to the wound surface. This was removed with some difficulty as well. Change the primary dressing to Hydrofera Blue ready 03/12/18; small open area on the left lateral calf. Comes in with tightly adherent surface eschar as well as some adherent Hydrofera Blue. 03/19/18; open area on the left lateral calf. Again adherent surface eschar as well as some adherent Hydrofera Blue nonviable subcutaneous tissue. She complained of pain all week even with the reduction from 4-3 layer compression I put on last week. Also she had an increase in her ankle and calf measurements probably related to  the same thing. 03/26/18; open area on the left lateral calf. A very small open area remains here. We used silver alginate starting last week as the Hydrofera Blue seem to  stick to the wound bed. In using 4-layer compression 04/02/18; the open area in the left lateral calf at some adherent slough which I removed there is no open area here. We are able to transition her into her own compression stocking. Truthfully I think this is probably his support hose. However this does not maintain skin integrity will be limited. She cannot put over the toe compression stockings on because of neck problems hand problems etc. She is allergic to the lining layer of juxta lites. We might be forced to use extremitease stocking should this fail READMIT 11/24/2018 Patient is now a 75 year old woman who is not a diabetic. She has been in this clinic on at least 3 previous occasions largely with recurrent wounds on her left leg secondary to chronic venous insufficiency with secondary lymphedema. Her situation is complicated by inability to get stockings on and an allergy to neoprene which is apparently a component and at least juxta lites and other stockings. As a result she really has not been wearing any stockings on her legs. She tells [place] that roughly 2 or 3 weeks ago she started noticing a stinging sensation just above her ankle on the left medial aspect. She has been diagnosed with pseudogout and she wondered whether this was what she was experiencing. She tried to dress this with something she bought at the store however subsequently it pulled skin off and now she has an open wound that is not improving. She has been using Vaseline gauze with a cover bandage. She saw her primary doctor last week who put an Haematologist on her. ABIs in this clinic was 1.03 on the left 2/12; the area is on the left medial ankle. Odd-looking wound with what looks to be surface epithelialization but a multitude of small  petechial openings. This clearly not closed yet. We have been using silver alginate under 3 layer compression with TCA 2/19; the wound area did not look quite as good this week. Necrotic debris over the majority of the wound surface which required debridement. She continues to have a multitude of what looked to be small petechial openings. She reminds [place] that she had a biopsy on this initially during her first outbreak in 2015 in [place] dermatology. She expresses concern about this being a possible melanoma. 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 I assured her that this does not look anything like melanoma. Except for the petechial reaction it does look like a venous insufficiency area and she certainly has evidence of this on both sides 2/26; a difficult area on the left medial ankle. The patient clearly has chronic venous hypertension with some degree of lymphedema. The odd thing about the area is the small petechial hemorrhages. I am not really sure how to explain this. This was present last time and this is not a compression injury. We have been using Hydrofera Blue which I changed to last week 3/4; still using Hydrofera Blue. Aggressive debridement today. She does not have known arterial issues. She has seen Dr. Kellie Simmering at [facility] vein and vascular and and has an ablation on the left. [Anterior accessory branch of the greater saphenous]. From what I remember they did not feel she had an arterial issue. The patient has had this area biopsied in 2009 at [facility] dermatology and by her recollection they said this was "stasis". She is also follow-up with dermatology locally who thought that this was  more of a vascular issue 3/11; using Hydrofera Blue. Aggressive debridement today. She does not have an arterial issue. We are using 3 layer compression although we may need to go to 4. The patient has been  in for multiple changes to her wrap since I last saw her a week ago. She says that the area was leaking. I do not have too much more information on what was found 01/19/19 on evaluation today patient was actually being seen for a nurse visit when unfortunately she had the area on her left lateral lower extremity as well as weeping from the right lower extremity that became apparent. Therefore we did end up actually seeing her for a full visit with myself. She is having some pain at this site as well but fortunately nothing too significant at this point. No fevers, chills, nausea, or vomiting noted at this time. 3/18-Patient is back to the clinic with the left leg venous leg ulcer, the ulcer is larger in size, has a surface that is densely adherent with fibrinous tissue, the Hydrofera Blue was used but is densely adherent and there was difficulty in removing it. The right lower extremity was also wrapped for weeping edema. Patient has a new area over the left lateral foot above the malleolus that is small and appears to have no debris with intact surrounding skin. Patient is on increased dose of Lasix also as a means to edema management 3/25; the patient has a nonhealing venous ulcer on the medial left leg and last week developed a smaller area on the lateral left calf. We have been using Hydrofera Blue with a contact layer. 4/1; no major change in these wounds areas. Left medial and more recently left lateral calf. I tried Iodoflex last week to aid in debridement she did not tolerate this. She stated her pain was terrible all week. She took the top layer of the 4 layer compression off. 4/8; the patient actually looks somewhat better in terms of her more prominent left lateral calf wound. There is some healthy looking tissue here. She is still complaining of a lot of discomfort. 4/15; patient in a lot of pain secondary to sciatica. She is on a prednisone taper prescribed by her primary physician. She  has the 2 areas one on the left medial and more recently a smaller area on the left lateral calf. Both of these just above the malleoli 4/22; her back pain is better but she still states she is very uncomfortable and now feels she is intolerant to the [employer]. No real change in the wounds we have been using Sorbact. She has been previously intolerant to Iodoflex. There is not a lot of option about what we can use to debride this wound under compression that she no doubt needs. sHe states Ultram no longer works for her pain 4/29; no major change in the wounds slightly increased depth. Surface on the original medial wound perhaps somewhat improved however the more recent area on the lateral left ankle is 100% covered in very adherent debris we have been using Sorbact. She tolerates 4 layer compression well and her edema control is a lot better. She has not had to come in for a nurse check 5/6; no major change in the condition of the wounds. She did consent to debridement today which was done with some difficulty. Continuing Sorbact. She did not tolerate Iodoflex. She was in for a check of her compression the day after we wrapped her last week this  was adjusted but MELA, OLIVARES ([id number]) nothing much was found 5/13; no major change in the condition or area of the wounds. I was able to get a fairly aggressive debridement done on the lateral left leg wound. Even using Sorbact under compression. She came back in on Friday to have the wrap changed. She says she felt uncomfortable on the lateral aspect of her ankle. She has a long history of chronic venous insufficiency including previous ablation surgery on this side. 5/20-Patient returns for wounds on left leg with both wounds covered in slough, with the lateral leg wound larger in size, she has been in 3 layer compression and felt more comfortable, she describes pain in ankle, in leg and pins and needles in foot, and is about to try Pamelor for  this 6/3; wounds on the left lateral and left medial leg. The area medially which is the most recent of the 2 seems to have had the largest increase in dimensions. We have been using Sorbac to try and debride the surface. She has been to see orthopedics they apparently did a plain x-ray that was indeterminant. Diagnosed her with neuropathy and they have ordered an MRI to determine if there is underlying osteomyelitis. This was not high on my thought list but I suppose it is prudent. We have advised her to make an appointment with vein and vascular in [place]. She has a history of a left greater saphenous and accessory vein ablations I wonder if there is anything else that can be done from a surgical point of view to help in these difficult refractory wounds. We have previously healed this wound on one occasion but it keeps on reopening [medial side] 6/10; deep tissue culture I did last week I think on the left medial wound showed both moderate E. coli and moderate staph aureus [MSSA]. She is going to require antibiotics and I have chosen Augmentin. We have been using Sorbact and we have made better looking wound surface on both sides but certainly no improvement in wound area. She was back in last Friday apparently for a dressing changes the wrap was hurting her outer left ankle. She has not managed to get a hold of vein and vascular in [place]. We are going to have to make her that appointment 6/17; patient is tolerating the Augmentin. She had an MRI that I think was ordered by orthopedic surgeon this did not show osteomyelitis or an abscess did suggest cellulitis. We have been using Sorbact to the lateral and medial ankles. We have been trying to arrange a follow-up appointment with vein and vascular in [place] or did her original ablations. We apparently an area sent the request to vein and vascular in [facility] 6/24; patient has completed the Augmentin. We do not yet have a vein and  vascular appointment in [place]. I am not sure what the issue is here we have asked her to call tomorrow. We are using Sorbact. Making some improvements and especially the medial wound. Both surfaces however look better medial and lateral. 7/1; the patient has been in contact with vein and vascular in [place] but has not yet received an appointment. Using Sorbact we have gradually improve the wound surface with no improvement in surface area. She is approved for Apligraf but the wound surface still is not completely viable. She has not had to come in for a dressing change 7/8; the patient has an appointment with vein and vascular on 7/31 which is a Friday afternoon. She is  concerned about getting back here for [place] to dress her wounds. I think it is important to have them goal for her venous reflux/history of ablations etc. to see if anything else can be done. She apparently tested positive for 1 of the blood tests with regards to lupus and saw a rheumatologist. He has raised the issue of vasculitis again. I have had this thought in the past however the evidence seems overwhelming that this is a venous reflux etiology. If the rheumatologist tells me there is clinical and laboratory investigation is positive for lupus I will rethink this. 7/15; the patient's wound surfaces are quite a bit better. The medial area which was her original wound now has no depth although the lateral wound which was the more recent area actually appears larger. Both with viable surfaces which is indeed better. Using Sorbact. I wanted to use Apligraf on her however there is the issue of the vein and vascular appointment on 7/31 at 2:00 in the afternoon which would not allow her to get back to be rewrapped and they would no doubt remove the graft 7/22; the patient's wound surfaces have moderate amount of debris although generally look better. The lateral one is larger with 2 small satellite areas superiorly. We are  waiting for her vein and vascular appointment on 7/31. She has been approved for Apligraf which I would like to use after th 7/29; wound surfaces have improved no debridement is required we have been using Sorbact. She sees vein and vascular on Friday with this so question of whether anything can be done to lessen the likelihood of recurrence and/or speed the healing of these areas. She is already had previous ablations. She no doubt has severe venous hypertension 8/5-Patient returns at 1 week, she was in [place] for 3 days by her podiatrist, we have been using so backed to the wound, she has increased pain in both the wounds on the left lower leg especially the more distal one on the lateral aspect 8/12-Patient returns at 1 week and she is agreeable to having debridement in both wounds on her left leg today. We have been using Sorbact, and vascular studies were reviewed at last visit 8/19; the patient arrives with her wounds fairly clean and no debridement is required. We have used Sorbact which is really done a nice job in cleaning up these very difficult wound surfaces. The patient saw Dr. Donzetta Matters of vascular surgery on 7/31. He did not feel that there was an arterial component. He felt that her treated greater saphenous vein is adequately addressed and that the small saphenous vein did not appear to be involved significantly. She was also noted to have deep venous reflux which is not treatable. Dr. Donzetta Matters mentioned the possibility of a central obstructive component leading to reflux and he offered her central venography. She wanted to discuss this or think about it. I have urged her to go ahead with this. She has had recurrent difficult wounds in these areas which do heal but after months in the clinic. If there is anything that can be done to reduce the likelihood of this I think it is worth it. 9/2 she is still working towards getting follow-up with Dr. Donzetta Matters to schedule her CT. Things are quite a  bit worse venography. I put Apligraf on 2 weeks ago on both wounds on the medial and lateral part of her left lower leg. She arrives in clinic today with 3 superficial additional wounds above the area laterally  and one below the wound medially. She describes a lot of discomfort. I think these are probably wrapped injuries. Does not look like she has cellulitis. 07/20/2019 on evaluation today patient appears to be doing somewhat poorly in regard to her lower extremity ulcers. She in fact showed signs of erythema in fact we may even be dealing with an infection at this time. Unfortunately I am unsure if this is just infection or if indeed there may be some allergic reaction that occurred as a result of the Apligraf application. With that being said that would be unusual but nonetheless not impossible in this patient is one who is unfortunately allergic to quite a bit. Currently we have been using the Sorbact which seems to do as well as anything for her. I do think we may want to obtain a culture today to see if there is anything showing up there that may need to be addressed. 9/16; noted that last week the wounds look worse in 1 week follow-up of the Apligraf. Using Sorbact as of 2 days ago. She arrives with copious amounts of drainage and new skin breakdown on the back of the left calf. The wounds arm more substantial bilaterally. There is a fair amount of swelling in the left calf no overt DVT there is edema present I think in the left greater than right thigh. She is supposed to go on 9/28 for CT venography. The wounds on the medial and lateral calf are worse and she has new skin breakdown posteriorly at least new for me. This is almost developing into a circumferential wound area The Apligraf was taken off last week which I agree with things are not going in the right direction a culture was done we do not have that back yet. She is on Augmentin that she started 2 days ago 9/23; dressing was  changed by her nurses on Monday. In general there is no improvement in the wound areas although the area looks less angry than last week. She did get Augmentin for MSSA cultured on the 14th. She still appears to have too much swelling in the left leg even with 3 layer compression 9/30; the patient underwent her procedure on 9/28 by Dr. Donzetta Matters at vascular and vein specialist. She was discovered to have the common iliac vein measuring 12.2 mm but at the level of L4-L5 measured 3 mm. After stenting it measured 10 mm. 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 Rouleaux flow in the common femoral and femoral vein was observed much improved after stenting. We are using silver alginate to the wounds on the medial and lateral ankle on the left. 4 layer compression KWEEN, ANGRISANI ([id number]) 10/7; the patient had fluid swelling around her knee and 4 layer compression. At the advice of vein and vascular this was reduced to 3 layer which she is tolerating better. We have been using silver alginate under 3 layer compression since last Friday 10/14; arrives with the areas on the left ankle looking a lot better. Inflammation in the area also a lot better. She came in for a nurse check on 10/9 10/21; continued nice improvement. Slight improvements in surface area of both the medial and lateral wounds on the left. A lot of the satellite lesions in the weeping erythema around these from stasis dermatitis is resolved. We have been using silver alginate 10/28; general improvement in the entire wound areas although not a lot of change in dimensions the wound certainly looks better. There  is a lot less in terms of venous inflammation. Continue silver alginate this week however look towards Hydrofera Blue next week 11/4; very adherent debris on the medial wound left wound is not as bad. We have been using silver alginate. Change to [facility] today 11/11; very adherent debris on both wound areas. She  went to vein and vascular last week and follow-up they put in [place] boot on this today. He says the [facility] was adherent. Wound is definitely not as good as last week. Especially on the left there the satellite lesions look more prominent 11/18; absolutely no better. erythema on lateral aspect with tenderness. 09/30/2019 on evaluation today patient appears to actually be doing better. Dr. Dellia Nims did put her on doxycycline last week which I do believe has helped her at this point. Fortunately there is no signs of active infection at this time. No fevers, chills, nausea, vomiting, or diarrhea. I do believe he may want extend the doxycycline for 7 additional days just to ensure everything does completely cleared up the patient is in agreement with that plan. Otherwise she is going require some sharp debridement today 12/2; patient is completing a 2-week course of doxycycline. I gave her this empirically for inflammation as well as infection when I last saw her 2 weeks ago. All of this seems to be better. She is using silver alginate she has the area on the medial aspect of the larger area laterally and the 2 small satellite regions laterally above the major wound. 12/9; the patient's wound on the left medial and left lateral calf look really quite good. We have been using silver alginate. She saw vein and vascular in follow-up on 10/09/2019. She has had a previous left greater saphenous vein ablation by Dr. Oscar La in 2016. More recently she underwent a left common iliac vein stent by Dr. 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 The swelling is improved and certainly the wounds have improved. The patient shows [place] today area on the right medial calf there is almost no wound but leaking lymphedema. She says she start this started 3 or 4 days ago. She did not traumatize it. It is not painful. She does not wear compression on that side 12/16; the patient continues to do well laterally.  Medially still requiring debridement. The area on the right calf did not materialize to anything and is not currently open. We wrapped this last time. She has support stockings for that leg although I am not sure they are going to provide adequate compression 12/23; the lateral wound looks stable. Medially still requiring debridement for tightly adherent fibrinous debris. We've been using silver alginate. Surface area not any different 12/30; neither wound is any better with regards to surface and the area on the left lateral is larger. I been using silver alginate to the left lateral which look quite good last week and Sorbact to the left medial 11/11/2019. Lateral wound area actually looks better and somewhat smaller. Medial still requires a very aggressive debridement today. We have been using Sorbact on both wound areas 1/13; not much better still adherent debris bilaterally. I been using Sorbact. She has severe venous hypertension. Probably some degree of dermal fibrosis distally. I wonder whether tighter compression might help and I am going to try that today. We also need to work on the bioburden 1/20; using Sorbact. She has severe venous hypertension status post stent placement for pelvic vein compression. We applied gentamicin last time to see if  we could reduce bioburden I had some discussion with her today about the use of pentoxifylline. This is occasionally used in this setting for wounds with refractory venous insufficiency. However this interacts with Plavix. She tells me that she was put on this after stent placement for 3 months. She will call Dr. Claretha Cooper office to discuss 1/27; we are using gentamicin under Sorbact. 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 She has a stent. Wound medially is measuring smaller this week. Laterally measuring slightly larger although she has some satellite lesions superiorly 2/3; gentamicin under Sorbact under 4-layer  compression. 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 She has a stent on Plavix. Her wounds are measuring smaller this week. More substantially laterally where there is a satellite lesion superiorly. 2/10; gentamicin under Sorbac. 4-layer compression. Patient communicated with Dr. Donzetta Matters at vein and vascular in [place]. He is okay with the patient coming off Plavix I will therefore start her on pentoxifylline for a 1 month trial. In general her wounds look better today. I had some concerns about swelling in the left thigh however she measures 61.5 on the right and 63 on the mid thigh which does not suggest there is any difficulty. The patient is not describing any pain. 2/17; gentamicin under Sorbac 4-layer compression. She has been on pentoxifylline for 1 week and complains of loose stool. No nausea she is eating and drinking well 2/24; the patient apparently came in 2 days ago for a nurse visit when her wrap fell down. Both areas look a little worse this week macerated medially and satellite lesions laterally. Change to silver alginate today 3/3; wounds are larger today especially medially. She also has more swelling in her foot lower leg and I even noted some swelling in her posterior thigh which is tender. I wonder about the patency of her stent. Fortuitously she sees Dr. Claretha Cooper group on Friday 3/10; Mrs. Banken was seen by vein and vascular on 3/5. The patient underwent ultrasound. There was no evidence of thrombosis involving the IVC no evidence of thrombosis involving the right common iliac vein there is no evidence of thrombosis involving the right external iliac vein the left external vein is also patent. The right common iliac vein stent appears patent bilateral common femoral veins are compressible and appear patent. I was concerned about the left common iliac stent however it looks like this is functional. She has some edema in the posterior thigh that was  tender she still has that this week. I also note they had trouble finding the pulses in her left foot and booked her for an ABI baseline in 4 weeks. She will follow up in 6 months for repeat IVC duplex. The patient stopped the pentoxifylline because of diarrhea. It does not look like that was being effective in any case. I have advised her to go back on her aspirin 81 mg tablet, vascular it also suggested this 3/17; comes in today with her wound surfaces a lot better. The excoriations from last week considerably better probably secondary to the TCA. We have been using silver alginate 3/24; comes in today with smaller wounds both medially and laterally. Both required debridement. There are 2 small satellite areas superiorly laterally. She also has a very odd bandlike area in the mid calf almost looking like there was a weakness in the wrap in a localized area. I would write this off as being this however anteriorly she has a small raised ballotable area that is  very tender almost reminiscent of an abscess but there was no DRESDEN, BOKHARI. ([id number]) obvious purulent surface to it. 02/04/20 upon evaluation today patient appears to be doing fairly well in regard to her wounds today. Fortunately there is no signs of active infection at this time. No fevers, chills, nausea, vomiting, or diarrhea. She has been tolerating the dressing changes without complication. Fortunately I feel like she is showing signs of improvement although has been sometime since have seen her. Nonetheless the area of concern that Dr. Dellia Nims had last week where she had possibly an area of the wrap that was we can allow the leg to bulge appears to be doing significantly better today there is no signs of anything worsening. 4/7; the patient's wounds on her medial and lateral left leg continue to contract. We have been using a regular alginate. Last week she developed an area on the right medial lower leg which is probably a venous  ulcer as well. 4/14; the wounds on her left medial and lateral lower leg continue to contract. Surface eschar. We have been using regular alginate. The area on the right medial lower leg is closed. We have been putting both legs under 4-layer contraction. The patient went back to see vein and vascular she had arterial studies done which were apparently "quite good" per the patient although I have not read their notes I have never felt she had an arterial issue. 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 This is been longstanding and refractory to exercise, leg elevation and longstanding use of compression wraps in our clinic as well as compression stockings on the times we have been able to get these to heal 4/21; we thought she actually might be close this week however she arrives in clinic with a lot of edema in her upper left calf and into her posterior thigh. This is been an intermittent problem here. She says the wrap fell down but it was replaced with a nurse visit on Monday. We are using calcium alginate to the wounds and the wound sizes there not terribly larger than last week but there is a lot more edema 4/28; again wound edges are smaller on both sides. Her edema is better controlled than last time. She is obtained her compression pumps from [employer] although they have not been to her home to set these up. 5/5; left medial and left lateral both look stable. I am not sure the medial is any smaller. We have been using calcium alginate under 4-layer compression. She had an area on the right medial. This was eschared today. We have been wrapping this as well. She does not tolerate external compression stockings due to a history of various contact allergies. She has her compression pumps however the representative from the company is coming on her to show her how to use these tomorrow 5/19; patient with severe chronic venous insufficiency  secondary to central venous disease. She had a stent placed in her left common iliac vein. She has done better since but still difficult to control wounds. She comes in today with nothing open on the right leg. Her areas on the left medial and left lateral are just about closed. We are using calcium alginate under 4-layer compression. She is using her external compression pumps at home She only has 15-20 support stockings. States she cannot get anything tighter than that on. 03/30/20-Patient returns at 1 week, the wounds on the left leg are both slightly bigger,  the last week she was on 3 layer compression which started to slide down. She is starting to use her lymphedema pumps although she stated on 1 day her right ankle started to swell up and she have to stop that day. Unfortunately the open area seem to oscillate between improving to the point of healing and then flaring up all to do with effectiveness of compression or lack of due to the left leg topography not keeping the compression wraps from rolling down 6/2 patient comes in with a 15/20 mmHg stocking on the right leg. She tells me that she developed a lot of swelling in her ankles she saw orthopedics she was felt to possibly be having a flare of pseudogout versus some other type of arthritis. She was put on steroids for a respiratory issue so that helps with the inflammation. She has not been using the pumps all week. She thinks the left thigh is more swollen than usual and I would agree with that. She has an appointment with Dr. Donzetta Matters 9 days or so from now 6/9; both wounds on the left medial and left lateral are smaller. We have been using calcium alginate under compression. She does not have an open wound on the right leg she is using a stocking and her compression pumps things are going well. She has an appointment with Dr. Donzetta Matters with regards to her stent in the left common iliac vein 6/16; the wounds on the left medial and left lateral  ankle continues to contract. The patient saw Dr. Donzetta Matters and I think he seems satisfied. Ordered follow-up venous reflux studies on both sides in September. Cautioned that she may need thigh-high stockings. She has been using calcium alginate under compression on the left and her own stocking on the right leg. She tells [place] there are no open wounds on the right 6/23; left lateral is just about closed. Medial required debridement today. We have been using calcium alginate. Extensive discussion about the compression pumps she is only using these on 25 mmHg states she could not take 40 or 30 when the wrap came out to her home to demonstrate these. He said they should not feel tight 6/30; the left lateral wound has a slight amount of eschar. . The area medially is about the same using Hydrofera Blue. 7/7; left lateral wound still has some eschar. I will remove this next week may be closed. The area medially is very small using Hydrofera Blue with improvement. Unfortunately the stockings fell down. Unfortunately the blisters have developed at the edge of where the wrap fell. When this happened she says her legs hurt she did not use her pumps. We are not open Monday for her to come in and change the wraps and she had an appointment yesterday. She also tells me that she is going to have an MRI of her back. She is having pain radiating into her left anterior leg she thinks her from an L5 disc. She saw Dr. Ellene Route of neurosurgery 7/14; the area on the left lateral ankle area is closed. Still a small area medially however it looks better as well. We have been using Hydrofera Blue under 4-layer compression 7/21; left lateral ankle is still closed however her wound on the medial left calf is actually larger. This is probably because Hydrofera Blue got stuck to the wound. She came in for a nurse change on Friday and will do that again this week I was concerned about the amount of swelling that  she had last week  however she is using her compression pumps twice a day and the swelling seems well controlled 7/28; remaining wound on the left medial lower leg is smaller. We have been using moistened silver collagen under compression she is coming back for a nurse visit. For reasons that were not really clear she was just keeping her legs elevated and not using her compression pumps. I have asked her to use the compression pumps. She does not have any wounds on the right leg 06/15/20-Patient returns at 2 weeks, her LLE edema is worse and she developed a blister wound that is new and has bigger posterior calf wound on right, we are using Prisma with pad, 4 layer compression. she has been on lasix 40 mg daily 8/18; patient arrives today with things a lot worse than I remember from a few weeks ago. She was seen last week. Noted that her edema was worse and that she now had a left lateral wound as well as deteriorating edema in the medial and posterior part of the lower leg. She says she is using his or her external compression pumps once a day although I wonder about the compliance. 8/25; weeping area on the right medial lower leg. This had actually gotten a small localized area of her compression stocking wet. On the left side there is a large denuded area on the posterior medial lower leg and smaller area on the lateral. This was not the original areas that we dealt with. 9/1 the patient's wound on the left leg include the left lateral and left posterior. Larger superficial wounds weeping. She has very poor edema control. Tender localized edema in the left lower medial ankle/heel probably because of localized wrap issues. She freely admits she is not using [place], [place] ([id number]) the compression pumps. She has been up on her feet a lot. She thinks the hydrofera blue is contributing to the pain she is experiencing.. This is a complaint that I have occasionally heard 9/8; really not much improvement. The patient  is still complaining of a lot of pain particularly when she uses compression pumps. I switched her to silver alginate last time because she found the Hydrofera Blue to be irritating. I don't hear much difference in her description with the silver alginate. 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 She has follow-up with Dr. Donzetta Matters I think for ultrasound next month 9/15; quite a bit of improvement today. We have less edema and more epithelialization in both of her wound areas on the left medial and left lateral calf. These are not the site of her original wounds in this area. She says she has been using her compression pumps for 30 minutes twice a day, there pain issues that never quite understood. Silver alginate as the primary dressing 9/22; continued improvement. Both areas medially and laterally still have a small open area there is some eschar. She continues to complain of left medial ankle pain. Swelling in the leg is in much better condition. We have been using silver alginate 9/29; continued improvement. Both areas medially and laterally in the left calf look as though they are close some minor surface eschar but I think this is epithelialized. She comes in today saying she has a ruptured disc at L4-L5 cannot bend over to put on her stockings. 10/6; patient comes in today with no open wounds on either leg. However  her edema on the left leg in the upper one third of the lower leg is poorly controlled nonpitting. She says that she could not use the pumps for 2 days and then she has been using the last couple of days. It is not clear to me she has been able to get her stocking on. She has back problems. Mrs. Denapoli has severe chronic venous insufficiency with secondary lymphedema. Her venous insufficiency is partially centrally mediated and that she is now post stent in the left common iliac vein. [facility] Thurner's  syndrome/physiology]. She follows up with them on 10/15. She wears 20/30 below-knee stockings. She is supposed to use compression pumps at home although I think her compliance about with this is been less than 100%. I have asked her to use these 3 times a day. Finally I think she has lipodermatosclerosis in the left lower leg with an inverted bottle sign. It is been a major problem controlling the edema in the left leg. The right leg we have had wounds on but not as significant a problem is on the left READMISSION 04/12/2021 Mrs. Bhullar is a 75 year old woman we know well in this clinic. She has severe chronic venous insufficiency. 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 I believe she has had bilateral greater saphenous vein ablation in the past as well. She tells me that this wound opened sometime in March. She had a fall and thinks it was initially abrasion. She developed areas she describes as little blisters on the anterior part of her leg and she saw dermatology and was treated for methicillin staph aureus with several rounds of antibiotics. She has been using support stockings on the left leg and says this is the only thing she can get on. Her compression pump use maybe once a day she says if she did not use one she use the other. She comes in today with incredible swelling in the left leg with a wound on the left posterior calf. She has been using Neosporin to this previously a hydrocolloid. 6/15; patient arrives back for 1 week follow-up.Marland Kitchen Apparently her wrap fell down she did not call us to replace this. He has poor edema control. She only uses her compression pumps once a day 6/29; patient presents for 1 week follow-up. She has tolerated the compression wrap well. We have been using Iodoflex under the wrap. She has no issues or complaints today. 7/6; patient presents for 1 week follow-up. She states that the compression wrap rolled down her leg 4  days ago. She has been trying to keep the area covered but has no dressings at home to use. She denies signs of infection. 7/13; patient presents for 1 week follow-up. She states that her compression wrap rolled down her right leg and she called our office and had it placed a few days ago. She has been tolerating the current wrap well. She states that the Iodoflex is causing a burning sensation. She denies signs of infection. 7/27; patient presents for 1 week follow-up She has tolerated the compression wrap well with Hydrofera Blue underneath. She has now developed a wound to her right lower extremity. She reports having a culture done To this area by her dermatologist that she reports is negative. She currently denies signs of infection. 8/30; patient presents for 1 week follow-up. On the left side she has tolerated the compression wrap well with Hydrofera Blue underneath. She reports some discomfort to her  right lower extremity with the 3 layer compression. She currently denies signs of infection. Patient History Information obtained from Patient. Family History Cancer - Mother,Paternal Grandparents, Hypertension - Mother,Father, Kidney Disease - Paternal Grandparents, Lung Disease - Paternal Grandparents, No family history of Diabetes, Heart Disease, Hereditary Spherocytosis, Seizures, Stroke, Thyroid Problems, Tuberculosis. Social History Former smoker - quit at age 79 - ended on 04/19/1966, Marital Status - Divorced, Alcohol Use - Never, Drug Use - No History, Caffeine Use - Daily. Medical History Eyes Patient has history of Cataracts Denies history of Glaucoma, Optic Neuritis Ear/Nose/Mouth/Throat Denies history of Chronic sinus problems/congestion, Middle ear problems Hematologic/Lymphatic Denies history of Anemia, Hemophilia, Human Immunodeficiency Virus, Lymphedema, Sickle Cell Disease Respiratory Patient has history of Asthma, Sleep Apnea Denies history of Aspiration, Chronic  Obstructive Pulmonary Disease (COPD), Pneumothorax, Tuberculosis Cardiovascular Patient has history of Deep Vein Thrombosis - LLE 2010, Hypertension, Peripheral Venous Disease Denies history of Angina, Arrhythmia, Congestive Heart Failure, Coronary Artery Disease, Hypotension, Myocardial Infarction, Peripheral Arterial Koppen, Xenia J. ([id number]) Disease, Phlebitis, Vasculitis Gastrointestinal Denies history of Cirrhosis , Colitis, Crohn s, Hepatitis A, Hepatitis B, Hepatitis C Endocrine Denies history of Type I Diabetes, Type II Diabetes Genitourinary Denies history of End Stage Renal Disease Immunological Denies history of Lupus Erythematosus, Raynaud s, Scleroderma Integumentary (Skin) Denies history of History of Burn, History of pressure wounds Musculoskeletal Patient has history of Osteoarthritis - neck Denies history of Gout, Rheumatoid Arthritis, Osteomyelitis Neurologic Denies history of Dementia, Neuropathy, Quadriplegia, Paraplegia, Seizure Disorder Oncologic Patient has history of Received Chemotherapy - interfeon immunotherapy, Received Radiation Psychiatric Denies history of Anorexia/bulimia, Confinement Anxiety Hospitalization/Surgery History - Melanoma left shoulder. Medical And Surgical History Notes Constitutional Symptoms (General Health) Vein ablation LLE 2010 Vascular Sx ( vein ablationo) LLE 02/2015 Eyes Horners syndrome Genitourinary Kidney stones Neurologic ct scan showed swollen lymph nodes Oncologic Melanoma (R) shoulder 07/2010; (R) axillary lymphnode removal Objective Constitutional Vitals Time Taken: 1:31 PM, Height: 63 in, Weight: 212 lbs, BMI: 37.6, Temperature: 96.9 F, Pulse: 60 bpm, Respiratory Rate: 16 breaths/min, Blood Pressure: 171/83 mmHg. General Notes: Left lower extremity: To the posterior aspect there is an open wound with Granulation tissue and nonviable tissue present. No signs of infection Right lower extremity: Open wound  limited to skin breakdown. No obvious signs of infection. 3+ pitting edema to the knee Integumentary (Hair, Skin) Wound #13 status is Open. Original cause of wound was Gradually Appeared. The date acquired was: 02/24/2021. The wound has been in treatment 8 weeks. The wound is located on the Left,Distal,Posterior Lower Leg. The wound measures 1.8cm length x 1.5cm width x 0.1cm depth; 2.121cm^2 area and 0.212cm^3 volume. There is Fat Layer (Subcutaneous Tissue) exposed. There is no tunneling or undermining noted. There is a medium amount of serosanguineous drainage noted. There is medium (34-66%) pink granulation within the wound bed. There is a medium (34-66%) amount of necrotic tissue within the wound bed including Adherent Slough. Wound #14 status is Open. Original cause of wound was Gradually Appeared. The date acquired was: 05/05/2021. The wound has been in treatment 1 weeks. The wound is located on the Right Lower Leg. The wound measures 5cm length x 3cm width x 0.1cm depth; 11.781cm^2 area and 1.178cm^3 volume. There is Fat Layer (Subcutaneous Tissue) exposed. There is no tunneling or undermining noted. There is a medium amount of serosanguineous drainage noted. There is medium (34-66%) red, pink granulation within the wound bed. There is a medium (34-66%) amount of necrotic tissue within the wound bed including Adherent  Slough. Assessment Active Problems ICD-10 AMORY, MARCELLINO ([id number]) Chronic venous hypertension (idiopathic) with ulcer and inflammation of left lower extremity Lymphedema, not elsewhere classified Non-pressure chronic ulcer of left calf limited to breakdown of skin Unspecified open wound, right lower leg, initial encounter Patient's wounds have shown improvement since last clinic visit. To the right lower extremity I recommended continuing silver alginate. She would like to go later on compression and I recommended Kerlix/Coban. To the left lower extremity I debrided  nonviable tissue. No signs of infection. I recommended continuing Santyl and Hydrofera Blue under compression therapy. Procedures Wound #13 Pre-procedure diagnosis of Wound #13 is a Venous Leg Ulcer located on the Left,Distal,Posterior Lower Leg .Severity of Tissue Pre Debridement is: Fat layer exposed. There was a Excisional Skin/Subcutaneous Tissue Debridement with a total area of 2.7 sq cm performed by Kalman Shan, MD. With the following instrument(s): Curette to remove Viable and Non-Viable tissue/material. Material removed includes Subcutaneous Tissue and Slough and after achieving pain control using Lidocaine. A time out was conducted at 14:36, prior to the start of the procedure. A Minimum amount of bleeding was controlled with Pressure. The procedure was tolerated well. Post Debridement Measurements: 1.8cm length x 1.5cm width x 0.1cm depth; 0.212cm^3 volume. Character of Wound/Ulcer Post Debridement is improved. Severity of Tissue Post Debridement is: Fat layer exposed. Post procedure Diagnosis Wound #13: Same as Pre-Procedure Pre-procedure diagnosis of Wound #13 is a Venous Leg Ulcer located on the Left,Distal,Posterior Lower Leg . There was a Three Layer Compression Therapy Procedure by Donnamarie Poag, RN. Post procedure Diagnosis Wound #13: Same as Pre-Procedure Wound #14 Pre-procedure diagnosis of Wound #14 is a Venous Leg Ulcer located on the Right Lower Leg . There was a Three Layer Compression Therapy Procedure by Donnamarie Poag, RN. Post procedure Diagnosis Wound #14: Same as Pre-Procedure Plan Follow-up Appointments: Wound #13 Left,Distal,Posterior Lower Leg: Return Appointment in 2 weeks. Nurse Visit as needed - nurse visit to rewrap 06/13/21 Bathing/ Shower/ Hygiene: May shower with wound dressing protected with water repellent cover or cast protector. Anesthetic (Use 'Patient Medications' Section for Anesthetic Order Entry): Lidocaine applied to wound bed Edema Control -  Lymphedema / Segmental Compressive Device / Other: Elevate, Exercise Daily and Avoid Standing for Long Periods of Time. Elevate legs to the level of the heart and pump ankles as often as possible Elevate leg(s) parallel to the floor when sitting. Compression Pump: Use compression pump on left lower extremity for 60 minutes, twice daily. - TWICE DAILY for 1 hour Compression Pump: Use compression pump on right lower extremity for 60 minutes, twice daily. - TWICE DAILY for 1 hour WOUND #13: - Lower Leg Wound Laterality: Left, Posterior, Distal Cleanser: Soap and Water 1 x Per Week/30 Days Discharge Instructions: Gently cleanse wound with antibacterial soap, rinse and pat dry prior to dressing wounds Topical: Santyl Collagenase Ointment, 30 (gm), tube (Home Health) 1 x Per Week/30 Days Discharge Instructions: apply nickel thick to wound bed only Primary Dressing: Hydrofera Blue Ready Transfer Foam, 2.5x2.5 (in/in) (Generic) 1 x Per Week/30 Days Discharge Instructions: Apply Hydrofera Blue Ready to wound bed as directed Secondary Dressing: ABD Pad 5x9 (in/in) 1 x Per Week/30 Days Discharge Instructions: Cover with ABD pad Compression Wrap: Profore Lite LF 3 Multilayer Compression Bandaging System 1 x Per Week/30 Days Discharge Instructions: Apply 3 multi-layer wrap as prescribed. WOUND #14: - Lower Leg Wound Laterality: Right Cleanser: Soap and Water 1 x Per Week/30 Days Discharge Instructions: Gently cleanse wound with antibacterial soap,  rinse and pat dry prior to dressing wounds Primary Dressing: Silvercel Small 2x2 (in/in) 1 x Per Week/30 Days Discharge Instructions: Apply Silvercel Small 2x2 (in/in) as instructed Secondary Dressing: ABD Pad 5x9 (in/in) 1 x Per Week/30 Days Discharge Instructions: Cover with ABD pad YAMINA, SPONSELLER ([id number]) Secured With: Coban Cohesive Bandage 4x5 (yds) Stretched 1 x Per Week/30 Days Discharge Instructions: Apply Coban as directed. Secured With:  [employer] or Non-Sterile 6-ply 4.5x4 (yd/yd) 1 x Per Week/30 Days Discharge Instructions: Apply Kerlix as directed 1. In office sharp debridement 2. Continue Santyl and Hydrofera Blue under 3 layer compression to the left lower extremity 3. Continue silver alginate under Kerlix/Coban to the right lower extremity 4. Follow-up for nurse visit and wrap change next week 5. Follow-up with me in 2 weeks Electronic Signature(s) Signed: 06/07/2021 2:51:04 PM By: Kalman Shan DO Entered By: Kalman Shan on 06/07/2021 14:50:34 Condie, [name] -------------------------------------------------------------------------------- ROS/PFSH Details Patient Name: CHARLISHA, HAMBERG. Date of Service: 06/07/2021 1:15 PM Medical Record Number: [id number] Patient Account Number: [id number] Date of Birth/Sex: 12/24/1945 (75 y.o. F) Treating RN: Donnamarie Poag Primary Care Provider: [name] Other Clinician: Referring Provider: [name] Treating Provider/Extender: Yaakov Guthrie in Treatment: 8 Label Progress Note Print Version as History and Physical for this encounter Information Obtained From Patient Constitutional Symptoms (General Health) Medical History: Past Medical History Notes: Vein ablation LLE 2010 Vascular Sx ( vein ablationo) LLE 02/2015 Eyes Medical History: Positive for: Cataracts Negative for: Glaucoma; Optic Neuritis Past Medical History Notes: Horners syndrome Ear/Nose/Mouth/Throat Medical History: Negative for: Chronic sinus problems/congestion; Middle ear problems Hematologic/Lymphatic Medical History: Negative for: Anemia; Hemophilia; Human Immunodeficiency Virus; Lymphedema; Sickle Cell Disease Respiratory Medical History: Positive for: Asthma; Sleep Apnea Negative for: Aspiration; Chronic Obstructive Pulmonary Disease (COPD); Pneumothorax; Tuberculosis Cardiovascular Medical History: Positive for: Deep Vein Thrombosis - LLE 2010;  Hypertension; Peripheral Venous Disease Negative for: Angina; Arrhythmia; Congestive Heart Failure; Coronary Artery Disease; Hypotension; Myocardial Infarction; Peripheral Arterial Disease; Phlebitis; Vasculitis Gastrointestinal Medical History: Negative for: Cirrhosis ; Colitis; Crohnos; Hepatitis A; Hepatitis B; Hepatitis C Endocrine Medical History: Negative for: Type I Diabetes; Type II Diabetes Genitourinary Medical History: Negative for: End Stage Renal Disease Past Medical History Notes: Kidney stones KHUSHBU, [name] ([id number]) Immunological Medical History: Negative for: Lupus Erythematosus; Raynaudos; Scleroderma Integumentary (Skin) Medical History: Negative for: History of Burn; History of pressure wounds Musculoskeletal Medical History: Positive for: Osteoarthritis - neck Negative for: Gout; Rheumatoid Arthritis; Osteomyelitis Neurologic Medical History: Negative for: Dementia; Neuropathy; Quadriplegia; Paraplegia; Seizure Disorder Past Medical History Notes: ct scan showed swollen lymph nodes Oncologic Medical History: Positive for: Received Chemotherapy - interfeon immunotherapy; Received Radiation Past Medical History Notes: Melanoma (R) shoulder 07/2010; (R) axillary lymphnode removal Psychiatric Medical History: Negative for: Anorexia/bulimia; Confinement Anxiety HBO Extended History Items Eyes: Cataracts Immunizations Pneumococcal Vaccine: Received Pneumococcal Vaccination: Yes Received Pneumococcal Vaccination On or After 60th Birthday: No Immunization Notes: tetanus shot w/in the last 5 years per pt Implantable Devices None Hospitalization / Surgery History Type of Hospitalization/Surgery Melanoma left shoulder Family and Social History Cancer: Yes - Mother,Paternal Grandparents; Diabetes: No; Heart Disease: No; Hereditary Spherocytosis: No; Hypertension: Yes - Mother,Father; Kidney Disease: Yes - Paternal Grandparents; Lung Disease: Yes -  Paternal Grandparents; Seizures: No; Stroke: No; Thyroid Problems: No; Tuberculosis: No; Former smoker - quit at age 24 - ended on 04/19/1966; Marital Status - Divorced; Alcohol Use: Never; Drug Use: No History; Caffeine Use: Daily; Financial Concerns: No; Food, Clothing or Shelter Needs: No; Support  System Lacking: No; Transportation Concerns: No Electronic Signature(s) Signed: 06/07/2021 2:51:04 PM By: Kalman Shan DO Signed: 06/07/2021 3:01:59 PM By: Donnamarie Poag Entered By: Kalman Shan on 06/07/2021 14:47:11 Witzke, [name] ([id number]) -------------------------------------------------------------------------------- SuperBill Details Patient Name: LILER, PANTER. Date of Service: 06/07/2021 Medical Record Number: [id number] Patient Account Number: [id number] Date of Birth/Sex: 05/27/1946 (75 y.o. F) Treating RN: Donnamarie Poag Primary Care Provider: [name] Other Clinician: Referring Provider: [name] Treating Provider/Extender: Yaakov Guthrie in Treatment: 8 Diagnosis Coding ICD-10 Codes Code Description [phone] Chronic venous hypertension (idiopathic) with ulcer and inflammation of left lower extremity I89.0 Lymphedema, not elsewhere classified L97.221 Non-pressure chronic ulcer of left calf limited to breakdown of skin S81.801A Unspecified open wound, right lower leg, initial encounter Facility Procedures CPT4 Code: [id number] Description: [id number] - DEB SUBQ TISSUE 20 SQ CM/< Modifier: Quantity: 1 CPT4 Code: Description: ICD-10 Diagnosis Description L97.221 Non-pressure chronic ulcer of left calf limited to breakdown of skin Modifier: Quantity: Physician Procedures CPT4 Code: [id number] Description: 11042 - WC PHYS SUBQ TISS 20 SQ CM Modifier: Quantity: 1 CPT4 Code: Description: ICD-10 Diagnosis Description L97.221 Non-pressure chronic ulcer of left calf limited to breakdown of skin Modifier: Quantity: Electronic Signature(s) Signed: 06/07/2021  2:51:04 PM By: Kalman Shan DO Entered By: Kalman Shan on 06/07/2021 14:50:41

## 2021-06-09 ENCOUNTER — Encounter: Payer: Self-pay | Admitting: Family Medicine

## 2021-06-09 ENCOUNTER — Other Ambulatory Visit: Payer: Self-pay

## 2021-06-09 ENCOUNTER — Ambulatory Visit (INDEPENDENT_AMBULATORY_CARE_PROVIDER_SITE_OTHER): Payer: Medicare Other | Admitting: Family Medicine

## 2021-06-09 VITALS — BP 140/90 | HR 65 | Temp 98.3°F | Ht 63.0 in | Wt 214.2 lb

## 2021-06-09 DIAGNOSIS — M5431 Sciatica, right side: Secondary | ICD-10-CM

## 2021-06-09 DIAGNOSIS — I871 Compression of vein: Secondary | ICD-10-CM

## 2021-06-09 DIAGNOSIS — I872 Venous insufficiency (chronic) (peripheral): Secondary | ICD-10-CM

## 2021-06-09 NOTE — Assessment & Plan Note (Signed)
Recurrent episode landed her in the ER s/p treatment with toradol IM, oxycodone pill and discharged with prednisone taper with benefit. She stopped oxycodone due to concern over side effects however these sound more like side effects to prednisone that have diminished as dose dropped. Recommend finish prednisone course and update [place] if not improving as expected. Last flare of sciatica was 12/2020 treated with tramadol with benefit at that time.

## 2021-06-09 NOTE — Progress Notes (Signed)
Patient ID: [name], female    DOB: 1946-08-07, 75 y.o.   [name]  This visit was conducted in person.  BP 140/90   Pulse 65   Temp 98.3 F (36.8 C) (Temporal)   Ht '5\' 3"'$  (1.6 m)   Wt 214 lb 3 oz (97.2 kg)   SpO2 97%   BMI 37.94 kg/m    CC: hosp f/u visit  Subjective:   HPI: [name] is a 75 y.o. female presenting on 06/09/2021 for Follow-up (ER Visit)   Recent visit to [facility] ER for severe back pain. Records reviewed. Presented with R LBP with radiation to R buttock and leg unresolved with PO tramadol. Treated with toradol IM, discharged with prednisone and oxycodone course. She stopped oxycodone concerned it was causing irritability, continues prednisone taper as well as tylenol '1000mg'$  bid. Notes improvement since starting this regimen. Marked BP elevation while hospitalized.   Has seen neurosurgery (Elsner) - both agreed surgery not indicated - not bad enough.   Continues seeing wound clinic for recurrent leg ulcers, last seen 8/3, note reviewed. Using Hydrofera Blue dressing.   [name] since seen [facility] Dr Donzetta Matters (patent stent) - planned rpt venous reflux R [place] to eval for reflux contributory to recurrent ulcers.  Also saw peripheral vascular specialist (Dr Scot Dock) -  Also saw pulmonology for f/u OSA on CPAP. Rec trial melatonin at 10pm.   Has more cats at home.   Lumbar MRI 06/2020 IMPRESSION: 1. Disc degeneration at L2-3 and below without progression from 2019. 2. L3-4 moderate spinal stenosis. 3. L4-5 and L5-S1 asymmetric right subarticular recess narrowing.     Relevant past medical, surgical, family and social history reviewed and updated as indicated. Interim medical history since our last visit reviewed. Allergies and medications reviewed and updated. Outpatient Medications Prior to Visit  Medication Sig Dispense Refill   acetaminophen (TYLENOL) 650 MG CR tablet Take 1,300 mg by mouth every 8 (eight) hours as needed for pain.      albuterol  (VENTOLIN HFA) 108 (90 Base) MCG/ACT inhaler INHALE 2 PUFFS BY MOUTH EVERY 6 HORUS AS NEEDED FOR WHEEZING/SHORTNESS OF BREATH 8.5 g 3   aspirin EC 81 MG tablet Take 81 mg by mouth at bedtime. Melanoma prevention     furosemide (LASIX) 40 MG tablet Take 1 tablet (40 mg total) by mouth 2 (two) times daily as needed for fluid. Take second dose if needed for leg swelling 60 tablet 6   KLOR-CON M20 20 MEQ tablet TAKE 1 TABLET BY MOUTH TWICE A DAY 60 tablet 5   loratadine (CLARITIN) 10 MG tablet Take 10 mg by mouth daily.     losartan (COZAAR) 25 MG tablet TAKE 2 TABLETS BY MOUTH EVERY DAY 60 tablet 5   meloxicam (MOBIC) 15 MG tablet TAKE 1 TABLET BY MOUTH EVERY DAY AS NEEDED FOR PAIN 30 tablet 1   montelukast (SINGULAIR) 10 MG tablet TAKE 1 TABLET BY MOUTH EVERY DAY 30 tablet 3   Multiple Vitamin (MULTIVITAMIN WITH MINERALS) TABS tablet Take 1 tablet by mouth daily.     Polyvinyl Alcohol-Povidone (TEARS PLUS OP) Place 1 drop into both eyes daily as needed (dry eyes/ redness/ burning).      predniSONE (DELTASONE) 20 MG tablet 2 daily x 3 days, then 1 daily x 3 days then 1/2 daily x 4 days     PRESCRIPTION MEDICATION CPAP     Probiotic Product (PROBIOTIC DAILY PO) Take 1 tablet by mouth daily.  traMADol (ULTRAM) 50 MG tablet TAKE 1 TABLET BY MOUTH EVERY 8 HOURS AS NEEDED FOR UP TO 5 DAYS FOR MODERATE PAIN. 15 tablet 0   triamcinolone (NASACORT) 55 MCG/ACT AERO nasal inhaler Place 2 sprays into the nose daily. In each nostril     triamcinolone cream (KENALOG) 0.1 % Apply 1 application topically 2 (two) times daily. Apply to AA. Max 2 weeks at a time. 80 g 1   UNABLE TO FIND Take by mouth.     Vitamin D, Ergocalciferol, (DRISDOL) 1.25 MG (50000 UNIT) CAPS capsule TAKE 1 CAPSULE BY MOUTH EVERY 7 DAYS 12 capsule 3   cephALEXin (KEFLEX) 500 MG capsule Take 500 mg by mouth 3 (three) times daily.     No facility-administered medications prior to visit.     Per HPI unless specifically indicated in ROS  section below Review of Systems  Objective:  BP 140/90   Pulse 65   Temp 98.3 F (36.8 C) (Temporal)   Ht '5\' 3"'$  (1.6 m)   Wt 214 lb 3 oz (97.2 kg)   SpO2 97%   BMI 37.94 kg/m   Wt Readings from Last 3 Encounters:  06/09/21 214 lb 3 oz (97.2 kg)  05/26/21 211 lb 6.4 oz (95.9 kg)  05/16/21 212 lb (96.2 kg)      Physical Exam Constitutional:      Appearance: She is obese.     Comments: Ambulates with quad cane   Musculoskeletal:     Comments:  Discomfort midline lower lumbar spine Discomfort R lower lumbar paraspinous mm  Neg seated SLR No pain with int/ext rotation at hip bilaterally No pain at SIJ, GTB or sciatic notch bilaterally.   Neurological:     Mental Status: She is alert.  Psychiatric:        Mood and Affect: Mood normal.        Behavior: Behavior normal.      Results for orders placed or performed in visit on [id number]  Basic metabolic panel  Result Value Ref Range   Sodium 143 135 - 145 mEq/L   Potassium 4.1 3.5 - 5.1 mEq/L   Chloride 107 96 - 112 mEq/L   CO2 31 19 - 32 mEq/L   Glucose, Bld 96 70 - 99 mg/dL   BUN 15 6 - 23 mg/dL   Creatinine, Ser 0.67 0.40 - 1.20 mg/dL   GFR 86.03 >60.00 mL/min   Calcium 9.4 8.4 - 10.5 mg/dL  TSH  Result Value Ref Range   TSH 2.69 0.35 - 4.50 uIU/mL   *Note: Due to a large number of results and/or encounters for the requested time period, some results have not been displayed. A complete set of results can be found in Results Review.    Assessment & Plan:  This visit occurred during the SARS-CoV-2 public health emergency.  Safety protocols were in place, including screening questions prior to the visit, additional usage of staff PPE, and extensive cleaning of exam room while observing appropriate contact time as indicated for disinfecting solutions.   Problem List Items Addressed This Visit     Chronic venous insufficiency   May-Thurner syndrome   Right sided sciatica - Primary    Recurrent episode landed her in  the ER s/p treatment with toradol IM, oxycodone pill and discharged with prednisone taper with benefit. She stopped oxycodone due to concern over side effects however these sound more like side effects to prednisone that have diminished as dose dropped. Recommend finish prednisone course and  update [place] if not improving as expected. Last flare of sciatica was 12/2020 treated with tramadol with benefit at that time.          No orders of the defined types were placed in this encounter.  No orders of the defined types were placed in this encounter.    Patient Instructions  I'm glad you're doing better.  Finish prednisone taper.  May continue tramadol as needed.   Follow up plan: Return if symptoms worsen or fail to improve.  [name], MD

## 2021-06-09 NOTE — Patient Instructions (Addendum)
I'm glad you're doing better.  Finish prednisone taper.  May continue tramadol as needed.

## 2021-06-14 ENCOUNTER — Encounter: Payer: Medicare Other | Admitting: Physician Assistant

## 2021-06-14 ENCOUNTER — Other Ambulatory Visit: Payer: Self-pay

## 2021-06-14 DIAGNOSIS — L97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d: Secondary | ICD-10-CM | POA: Diagnosis not present

## 2021-06-14 DIAGNOSIS — I87333 Chronic venous hypertension (idiopathic) with ulcer and inflammation of bilateral lower extremity: Secondary | ICD-10-CM | POA: Diagnosis not present

## 2021-06-14 DIAGNOSIS — L97821 Non-pressure chronic ulcer of other part of left lower leg limited to breakdown of skin: Secondary | ICD-10-CM | POA: Diagnosis not present

## 2021-06-14 DIAGNOSIS — L97812 Non-pressure chronic ulcer of other part of right lower leg with fat layer exposed: Secondary | ICD-10-CM | POA: Diagnosis not present

## 2021-06-14 NOTE — Progress Notes (Signed)
SHENEKA, DILLAHUNTY ([id number]) Visit Report for 06/14/2021 Arrival Information Details Patient Name: NILLIE, OTA. Date of Service: 06/14/2021 1:15 PM Medical Record Number: [id number] Patient Account Number: [id number] Date of Birth/Sex: 1946/02/15 (75 y.o. F) Treating RN: Donnamarie Poag Primary Care Joselinne Lawal: [name] Other Clinician: Referring Ailine Hefferan: [name] Treating Lexx Monte/Extender: [name] in Treatment: 9 Visit Information History Since Last Visit Added or deleted any medications: No Patient Arrived: Ambulatory Had a fall or experienced change in No Arrival Time: 14:01 activities of daily living that may affect Accompanied By: self risk of falls: Transfer Assistance: None Hospitalized since last visit: No Patient Identification Verified: Yes Has Dressing in Place as Prescribed: Yes Secondary Verification Process Completed: Yes Has Compression in Place as Prescribed: Yes Patient Requires Transmission-Based No Pain Present Now: Yes Precautions: Patient Has Alerts: Yes Patient Alerts: Patient on Blood Thinner ASPIRIN NOT diabetic Electronic Signature(s) Signed: 06/14/2021 4:15:45 PM By: Donnamarie Poag Entered By: Donnamarie Poag on 06/14/2021 14:05:29 Stamas, [name] -------------------------------------------------------------------------------- Clinic Level of Care Assessment Details Patient Name: CESAR, MUSACCHIA. Date of Service: 06/14/2021 1:15 PM Medical Record Number: [id number] Patient Account Number: [id number] Date of Birth/Sex: [birth year] (75 y.o. F) Treating RN: Donnamarie Poag Primary Care Yeira Gulden: [name] Other Clinician: Referring Reymond Maynez: [name] Treating Nyree Applegate/Extender: [name] in Treatment: 9 Clinic Level of Care Assessment Items TOOL 1 Quantity Score '[]'$  - Use when EandM and Procedure is performed on INITIAL visit 0 ASSESSMENTS - Nursing Assessment / Reassessment '[]'$  - General Physical  Exam (combine w/ comprehensive assessment (listed just below) when performed on new 0 pt. evals) '[]'$  - 0 Comprehensive Assessment (HX, ROS, Risk Assessments, Wounds Hx, etc.) ASSESSMENTS - Wound and Skin Assessment / Reassessment '[]'$  - Dermatologic / Skin Assessment (not related to wound area) 0 ASSESSMENTS - Ostomy and/or Continence Assessment and Care '[]'$  - Incontinence Assessment and Management 0 '[]'$  - 0 Ostomy Care Assessment and Management (repouching, etc.) PROCESS - Coordination of Care '[]'$  - Simple Patient / Family Education for ongoing care 0 '[]'$  - 0 Complex (extensive) Patient / Family Education for ongoing care '[]'$  - 0 Staff obtains Programmer, systems, Records, Test Results / Process Orders '[]'$  - 0 Staff telephones HHA, Nursing Homes / Clarify orders / etc '[]'$  - 0 Routine Transfer to another Facility (non-emergent condition) '[]'$  - 0 Routine Hospital Admission (non-emergent condition) '[]'$  - 0 New Admissions / Biomedical engineer / Ordering NPWT, Apligraf, etc. '[]'$  - 0 Emergency Hospital Admission (emergent condition) PROCESS - Special Needs '[]'$  - Pediatric / Minor Patient Management 0 '[]'$  - 0 Isolation Patient Management '[]'$  - 0 Hearing / Language / Visual special needs '[]'$  - 0 Assessment of Community assistance (transportation, D/C planning, etc.) '[]'$  - 0 Additional assistance / Altered mentation '[]'$  - 0 Support Surface(s) Assessment (bed, cushion, seat, etc.) INTERVENTIONS - Miscellaneous '[]'$  - External ear exam 0 '[]'$  - 0 Patient Transfer (multiple staff / Civil Service fast streamer / Similar devices) '[]'$  - 0 Simple Staple / Suture removal (25 or less) '[]'$  - 0 Complex Staple / Suture removal (26 or more) '[]'$  - 0 Hypo/Hyperglycemic Management (do not check if billed separately) '[]'$  - 0 Ankle / Brachial Index (ABI) - do not check if billed separately Has the patient been seen at the hospital within the last three years: Yes Total Score: 0 Level Of Care: ____ [name]  ([id number]) Electronic Signature(s) Signed: 06/14/2021 4:15:45 PM By: Donnamarie Poag Entered By: Donnamarie Poag on 06/14/2021 14:31:26 Roberge, Hadas J. ([id number]) --------------------------------------------------------------------------------  Compression Therapy Details Patient Name: TAHIRY, KIN. Date of Service: 06/14/2021 1:15 PM Medical Record Number: [id number] Patient Account Number: [id number] Date of Birth/Sex: August 23, 1946 (75 y.o. F) Treating RN: Donnamarie Poag Primary Care Amoni Morales: [name] Other Clinician: Referring Sumit Branham: [name] Treating Kavita Bartl/Extender: [name] in Treatment: 9 Compression Therapy Performed for Wound Assessment: Wound #14 Right Lower Leg Performed By: Clinician Donnamarie Poag, RN Compression Type: Three Layer Post Procedure Diagnosis Same as Pre-procedure Electronic Signature(s) Signed: 06/14/2021 4:15:45 PM By: Donnamarie Poag Entered By: Donnamarie Poag on 06/14/2021 14:24:26 Gubbels, [name] -------------------------------------------------------------------------------- Compression Therapy Details Patient Name: LANESSA, KNEPP. Date of Service: 06/14/2021 1:15 PM Medical Record Number: [id number] Patient Account Number: [id number] Date of Birth/Sex: May 01, 1946 (75 y.o. F) Treating RN: Donnamarie Poag Primary Care Tanith Dagostino: [name] Other Clinician: Referring Randilyn Foisy: [name] Treating Aleea Hendry/Extender: [name] in Treatment: 9 Compression Therapy Performed for Wound Assessment: Wound #13 Left,Distal,Posterior Lower Leg Performed By: Junius Argyle, RN Compression Type: Four Layer Post Procedure Diagnosis Same as Pre-procedure Electronic Signature(s) Signed: 06/14/2021 4:15:45 PM By: Donnamarie Poag Entered By: Donnamarie Poag on 06/14/2021 14:31:15 Montejano, [name] -------------------------------------------------------------------------------- Encounter Discharge Information  Details Patient Name: SHADONNA, CADMAN. Date of Service: 06/14/2021 1:15 PM Medical Record Number: [id number] Patient Account Number: [id number] Date of Birth/Sex: 04/14/1946 (75 y.o. F) Treating RN: Donnamarie Poag Primary Care Aislin Onofre: [name] Other Clinician: Referring Aroura Vasudevan: [name] Treating Taiyo Kozma/Extender: [name] in Treatment: 9 Encounter Discharge Information Items Discharge Condition: Stable Ambulatory Status: Ambulatory Discharge Destination: Home Transportation: Private Auto Accompanied By: self Schedule Follow-up Appointment: Yes Clinical Summary of Care: Electronic Signature(s) Signed: 06/14/2021 4:15:45 PM By: Donnamarie Poag Entered By: Donnamarie Poag on 06/14/2021 14:35:26 Gonyer, [name] -------------------------------------------------------------------------------- Lower Extremity Assessment Details Patient Name: SHANECE, HAMBRIC. Date of Service: 06/14/2021 1:15 PM Medical Record Number: [id number] Patient Account Number: [id number] Date of Birth/Sex: 09-12-1946 (75 y.o. F) Treating RN: Donnamarie Poag Primary Care Aragon Scarantino: [name] Other Clinician: Referring Sammye Staff: [name] Treating Jakobie Henslee/Extender: Jeri Cos Weeks in Treatment: 9 Edema Assessment Assessed: [Left: Yes] [Right: Yes] Edema: [Left: Yes] [Right: Yes] Calf Left: Right: Point of Measurement: 32 cm From Medial Instep 46 cm 44 cm Ankle Left: Right: Point of Measurement: 10 cm From Medial Instep 22 cm 22.7 cm Vascular Assessment Pulses: Dorsalis Pedis Palpable: [Left:Yes] [Right:Yes] Electronic Signature(s) Signed: 06/14/2021 4:15:45 PM By: Donnamarie Poag Entered By: Donnamarie Poag on 06/14/2021 14:16:58 Duval, Melrose Lenna Sciara ([id number]) -------------------------------------------------------------------------------- Multi Wound Chart Details Patient Name: ALYZEA, RADON. Date of Service: 06/14/2021 1:15 PM Medical Record Number: [id number] Patient  Account Number: [id number] Date of Birth/Sex: [name] 07, 1946 (75 y.o. F) Treating RN: Donnamarie Poag Primary Care Semaja Lymon: [name] Other Clinician: Referring Daysean Tinkham: [name] Treating [name] [name]/Extender: [name] in Treatment: 9 Vital Signs Height(in): 64 Pulse(bpm): 70 Weight(lbs): 212 Blood Pressure(mmHg): 154/82 Body Mass Index(BMI): 38 Temperature(F): 98.5 Respiratory Rate(breaths/min): 16 Photos: [N/A:N/A] Wound Location: Left, Distal, Posterior Lower Leg Right Lower Leg N/A Wounding Event: Gradually Appeared Gradually Appeared N/A Primary Etiology: Venous Leg Ulcer Venous Leg Ulcer N/A Comorbid History: Cataracts, Asthma, Sleep Apnea, Cataracts, Asthma, Sleep Apnea, N/A Deep Vein Thrombosis, Deep Vein Thrombosis, Hypertension, Peripheral Venous Hypertension, Peripheral Venous Disease, Osteoarthritis, Received Disease, Osteoarthritis, Received Chemotherapy, Received Radiation Chemotherapy, Received Radiation Date Acquired: 02/24/2021 05/05/2021 N/A Weeks of Treatment: 9 2 N/A Wound Status: Open Open N/A Measurements L x W x D (cm) 1.7x1.5x0.1 4x4x0.1 N/A Area (cm) : 2.003 12.566 N/A Volume (cm) : 0.2  1.257 N/A % Reduction in Area: 66.00% 35.40% N/A % Reduction in Volume: 66.00% 35.30% N/A Classification: Full Thickness Without Exposed Full Thickness Without Exposed N/A Support Structures Support Structures Exudate Amount: Medium Medium N/A Exudate Type: Serosanguineous Serosanguineous N/A Exudate Color: red, brown red, brown N/A Granulation Amount: Medium (34-66%) Medium (34-66%) N/A Granulation Quality: Pink Red, Pink N/A Necrotic Amount: Medium (34-66%) Medium (34-66%) N/A Exposed Structures: Fat Layer (Subcutaneous Tissue): Fat Layer (Subcutaneous Tissue): N/A Yes Yes Fascia: No Fascia: No Tendon: No Tendon: No Muscle: No Muscle: No Joint: No Joint: No Bone: No Bone: No Epithelialization: None None N/A Treatment Notes Electronic  Signature(s) Signed: 06/14/2021 4:15:45 PM By: Donnamarie Poag Entered By: Donnamarie Poag on 06/14/2021 14:17:32 Lawrie, [name] ([id number]) -------------------------------------------------------------------------------- Multi-Disciplinary Care Plan Details Patient Name: KRISAN, BESTER. Date of Service: 06/14/2021 1:15 PM Medical Record Number: [id number] Patient Account Number: [id number] Date of Birth/Sex: Jul 22, 1946 (75 y.o. F) Treating RN: Donnamarie Poag Primary Care Lorree Millar: [name] Other Clinician: Referring Karsyn [name]: [name] Treating Karleigh Bunte/Extender: Jeri Cos Weeks in Treatment: 9 Active Inactive Electronic Signature(s) Signed: 06/14/2021 4:15:45 PM By: Donnamarie Poag Entered By: Donnamarie Poag on 06/14/2021 14:17:21 [name], [name] ([id number]) -------------------------------------------------------------------------------- Pain Assessment Details Patient Name: CALLIA, DIBLER. Date of Service: 06/14/2021 1:15 PM Medical Record Number: [id number] Patient Account Number: [id number] Date of Birth/Sex: 04/26/1946 (75 y.o. F) Treating RN: Donnamarie Poag Primary Care Watt Geiler: [name] Other Clinician: Referring Asmar Brozek: [name] Treating Shawan Tosh/Extender: [name] in Treatment: 9 Active Problems Location of Pain Severity and Description of Pain Patient Has Paino Yes Site Locations Pain Location: Generalized Pain, Pain in Ulcers Rate the pain. Current Pain Level: 3 Pain Management and Medication Current Pain Management: Electronic Signature(s) Signed: 06/14/2021 4:15:45 PM By: Donnamarie Poag Entered By: Donnamarie Poag on 06/14/2021 14:07:46 Etzkorn, [name] ([id number]) -------------------------------------------------------------------------------- Patient/Caregiver Education Details Patient Name: JASLYNNE, WYKA. Date of Service: 06/14/2021 1:15 PM Medical Record Number: [id number] Patient Account Number: [id number] Date of Birth/Gender:  18-Mar-1946 (75 y.o. F) Treating RN: Donnamarie Poag Primary Care Physician: [name] Other Clinician: Referring Physician: [name] Treating Physician/Extender: [name] in Treatment: 9 Education Assessment Education Provided To: Patient Education Topics Provided Venous: Wound/Skin Impairment: Electronic Signature(s) Signed: 06/14/2021 4:15:45 PM By: Donnamarie Poag Entered By: Donnamarie Poag on 06/14/2021 14:31:51 Walkup, [name] ([id number]) -------------------------------------------------------------------------------- Wound Assessment Details Patient Name: PATTYE, SABB. Date of Service: 06/14/2021 1:15 PM Medical Record Number: [id number] Patient Account Number: [id number] Date of Birth/Sex: June 22, 1946 (75 y.o. F) Treating RN: Donnamarie Poag Primary Care Graceanne Guin: [name] Other Clinician: Referring Donelda Mailhot: [name] Treating Michel Eskelson/Extender: Jeri Cos Weeks in Treatment: 9 Wound Status Wound Number: 13 Primary Venous Leg Ulcer Etiology: Wound Location: Left, Distal, Posterior Lower Leg Wound Open Wounding Event: Gradually Appeared Status: Date Acquired: 02/24/2021 Comorbid Cataracts, Asthma, Sleep Apnea, Deep Vein Thrombosis, Weeks Of Treatment: 9 History: Hypertension, Peripheral Venous Disease, Osteoarthritis, Clustered Wound: No Received Chemotherapy, Received Radiation Photos Wound Measurements Length: (cm) 1.7 Width: (cm) 1.5 Depth: (cm) 0.1 Area: (cm) 2.003 Volume: (cm) 0.2 % Reduction in Area: 66% % Reduction in Volume: 66% Epithelialization: None Tunneling: No Undermining: No Wound Description Classification: Full Thickness Without Exposed Support Structures Exudate Amount: Medium Exudate Type: Serosanguineous Exudate Color: red, brown Foul Odor After Cleansing: No Slough/Fibrino Yes Wound Bed Granulation Amount: Medium (34-66%) Exposed Structure Granulation Quality: Pink Fascia Exposed: No Necrotic Amount:  Medium (34-66%) Fat Layer (Subcutaneous Tissue) Exposed: Yes Necrotic Quality: Adherent Slough Tendon Exposed: No Muscle Exposed: No Joint  Exposed: No Bone Exposed: No Treatment Notes Wound #13 (Lower Leg) Wound Laterality: Left, Posterior, Distal Cleanser Soap and Water Discharge Instruction: Gently cleanse wound with antibacterial soap, rinse and pat dry prior to dressing wounds Peri-Wound Care RUBEE, CONWILL ([id number]) Topical Triamcinolone Acetonide Cream, 0.1%, 15 (g) tube Discharge Instruction: Peri wound Apply as directed by Jocie Meroney. Primary Dressing AquacelAg Advantage Dressing, 4x5 (in/in) Discharge Instruction: Apply to wound as directed Secondary Dressing ABD Pad 5x9 (in/in) Discharge Instruction: Cover with ABD pad Secured With Compression Wrap Medichoice 4 layer Compression System, 35-40 mmHG Discharge Instruction: Apply multi-layer wrap as directed. Compression Stockings Add-Ons Electronic Signature(s) Signed: 06/14/2021 4:15:45 PM By: Donnamarie Poag Entered By: Donnamarie Poag on 06/14/2021 14:14:02 Cocke, [name] -------------------------------------------------------------------------------- Wound Assessment Details Patient Name: GENEVA, PREVETTE. Date of Service: 06/14/2021 1:15 PM Medical Record Number: [id number] Patient Account Number: [id number] Date of Birth/Sex: [birth year] (75 y.o. F) Treating RN: Donnamarie Poag Primary Care Kimiyah Blick: [name] Other Clinician: Referring Zayde Stroupe: [name] Treating Lamoyne Hessel/Extender: [name] in Treatment: 9 Wound Status Wound Number: 14 Primary Venous Leg Ulcer Etiology: Wound Location: Right Lower Leg Wound Open Wounding Event: Gradually Appeared Status: Date Acquired: 05/05/2021 Comorbid Cataracts, Asthma, Sleep Apnea, Deep Vein Thrombosis, Weeks Of Treatment: 2 History: Hypertension, Peripheral Venous Disease, Osteoarthritis, Clustered Wound: No Received Chemotherapy,  Received Radiation Photos Wound Measurements Length: (cm) 4 Width: (cm) 4 Depth: (cm) 0.1 Area: (cm) 12.566 Volume: (cm) 1.257 % Reduction in Area: 35.4% % Reduction in Volume: 35.3% Epithelialization: None Tunneling: No Undermining: No Wound Description Classification: Full Thickness Without Exposed Support Structures Exudate Amount: Medium Exudate Type: Serosanguineous Exudate Color: red, brown Foul Odor After Cleansing: No Slough/Fibrino Yes Wound Bed Granulation Amount: Medium (34-66%) Exposed Structure Granulation Quality: Red, Pink Fascia Exposed: No Necrotic Amount: Medium (34-66%) Fat Layer (Subcutaneous Tissue) Exposed: Yes Necrotic Quality: Adherent Slough Tendon Exposed: No Muscle Exposed: No Joint Exposed: No Bone Exposed: No Treatment Notes Wound #14 (Lower Leg) Wound Laterality: Right Cleanser Soap and Water Discharge Instruction: Gently cleanse wound with antibacterial soap, rinse and pat dry prior to dressing wounds Peri-Wound Care TERESSIA, JASPERSON ([id number]) Topical Triamcinolone Acetonide Cream, 0.1%, 15 (g) tube Discharge Instruction: Peri wound Apply as directed by Mirenda Baltazar. Primary Dressing AquacelAg Advantage Dressing, 4x5 (in/in) Discharge Instruction: Apply to wound as directed Secondary Dressing ABD Pad 5x9 (in/in) Discharge Instruction: Cover with ABD pad Secured With Compression Wrap Profore Lite LF 3 Multilayer Compression Bandaging System Discharge Instruction: Apply 3 multi-layer wrap as prescribed. Compression Stockings Add-Ons Electronic Signature(s) Signed: 06/14/2021 4:15:45 PM By: Donnamarie Poag Entered By: Donnamarie Poag on 06/14/2021 14:15:11 Alfrey, [name] -------------------------------------------------------------------------------- Vitals Details Patient Name: JAIA, HIGNIGHT. Date of Service: 06/14/2021 1:15 PM Medical Record Number: [id number] Patient Account Number: [id number] Date of Birth/Sex:  07-Feb-1946 (75 y.o. F) Treating RN: Donnamarie Poag Primary Care Twanisha [name]: [name] Other Clinician: Referring Shi Blankenship: [name] Treating Oralia Criger/Extender: [name] in Treatment: 9 Vital Signs Time Taken: 14:05 Temperature (F): 98.5 Height (in): 63 Pulse (bpm): 62 Weight (lbs): 212 Respiratory Rate (breaths/min): 16 Body Mass Index (BMI): 37.6 Blood Pressure (mmHg): 154/82 Reference Range: 80 - 120 mg / dl Electronic Signature(s) Signed: 06/14/2021 4:15:45 PM By: Donnamarie Poag Entered ByDonnamarie Poag on 06/14/2021 14:07:36

## 2021-06-14 NOTE — Progress Notes (Addendum)
[name], PHON ([name]) Visit Report for 06/14/2021 Chief Complaint Document Details Patient Name: [name], [name]. Date of Service: 06/14/2021 1:15 PM Medical Record Number: [name] Patient Account Number: [id number] Date of Birth/Sex: January 02, 1946 (75 y.o. F) Treating RN: Donnamarie Poag Primary Care Provider: [name] Other Clinician: Referring Provider: [name] Treating Provider/Extender: [name] in Treatment: 9 Information Obtained from: Patient Chief Complaint Left calf venous stasis ulceration. 11/13/16; the patient is here for a left calf recurrent ulceration which is painful and has been present for the last month 01/22/17; the patient re-presents here for recurrent difficulties with blistering and leaking fluid on her left posterior calf 12/10/18; the patient returns to clinic for a wound on the left medial calf 04/12/2021; left lower extremity wound, 05/31/21; right leg wound Electronic Signature(s) Signed: 06/14/2021 2:06:18 PM By: Worthy Keeler PA-C Entered By: Worthy Keeler on 06/14/2021 [name], [name] ([name]) -------------------------------------------------------------------------------- HPI Details Patient Name: [name], [name]. Date of Service: 06/14/2021 1:15 PM Medical Record Number: [name] Patient Account Number: [id number] Date of Birth/Sex: [birth year] (75 y.o. F) Treating RN: Donnamarie Poag Primary Care Provider: [name] Other Clinician: Referring Provider: [name] Treating Provider/Extender: [name] in Treatment: 9 History of Present Illness HPI Description: Pleasant 75 year old with history of chronic venous insufficiency. No diabetes or peripheral vascular disease. Left ABI 1.29. Questionable history of left lower extremity DVT. She developed a recurrent ulceration on her left lateral calf in December 2015, which she attributes to poor diet and subsequent lower extremity edema. She underwent  endovenous laser ablation of her left greater saphenous vein in 2010. She underwent laser ablation of accessory branch of left GSV in April 2016 by Dr. Kellie Simmering at [facility]. She was previously wearing Unna boots, which she tolerated well. Tolerating 2 layer compression and cadexomer iodine. She returns to clinic for follow-up and is without new complaints. She denies any significant pain at this time. She reports persistent pain with pressure. No claudication or ischemic rest pain. No fever or chills. No drainage. READMISSION 11/13/16; this is a 75 year old woman who is not a diabetic. She is here for a review of a painful area on her left medial lower extremity. I note that she was seen here previously last year for wound I believe to be in the same area. At that time she had undergone previously a left greater saphenous vein ablation by Dr. Kellie Simmering and she had a ablation of the anterior accessory branch of the left greater saphenous vein in March 2016. Seeing that the wound actually closed over. In reviewing the history with her today the ulcer in this area has been recurrent. She describes a biopsy of this area in 2009 that only showed stasis physiology. She also has a history of today malignant melanoma in the right shoulder for which she follows with Dr. 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 Do not see that she has ever had arterial studies in the left leg. She tells me she has a follow-up with Dr. Kellie Simmering in roughly 10 days In any case she developed the reopening of this area roughly a month ago. On the background of this she describes rapidly increasing edema which has responded to Lasix 40 mg and metolazone 2.5 mg as well as the patient's lymph massage. She has been told she has both venous insufficiency and lymphedema but she  cannot tolerate compression stockings 11/28/16;  the patient saw Dr. Kellie Simmering recently. Per the patient he did arterial Dopplers in the office that did not show evidence of arterial insufficiency, per the patient he stated "treat this like an ordinary venous ulcer". She also saw her dermatologist Dr. Ronnald Ramp who felt that this was more of a vascular ulcer. In general things are improving although she arrives today with increasing bilateral lower extremity edema with weeping a deeper fluid through the wound on the left medial leg compatible with some degree of lymphedema 12/04/16; the patient's wound is fully epithelialized but I don't think fully healed. We will do another week of depression with Promogran and TCA however I suspect we'll be able to discharge her next week. This is a very unusual-looking wound which was initially a figure-of-eight type wound lying on its side surrounded by petechial like hemorrhage. She has had venous ablation on this side. She apparently does not have an arterial issue per Dr. Kellie Simmering. She saw her dermatologist thought it was "vascular". Patient is definitely going to need ongoing compression and I talked about this with her today she will go to elastic therapy after she leaves here next week 12/11/16; the patient's wound is not completely closed today. She has surrounding scar tissue and in further discussion with the patient it would appear that she had ulcers in this area in 2009 for a prolonged period of time ultimately requiring a punch biopsy of this area that only showed venous insufficiency. I did not previously pickup on this part of the history from the patient. 12/18/16; the patient's wound is completely epithelialized. There is no open area here. She has significant bilateral venous insufficiency with secondary lymphedema to a mild-to-moderate degree she does not have compression stockings.. She did not say anything to me when I was in the room, she told our intake nurse that she was still having pain in this  area. This isn't unusual recurrent small open area. She is going to go to elastic therapy to obtain compression stockings. 12/25/16; the patient's wound is fully epithelialized. There is no open area here. The patient describes some continued episodic discomfort in this area medial left calf. However everything looks fine and healed here. She is been to elastic therapy and caught herself 15-20 mmHg stockings, they apparently were having trouble getting 20-30 mm stockings in her size 01/22/17; this is a patient we discharged from the clinic a month ago. She has a recurrent open wound on her medial left calf. She had 15 mm support stockings. I told her I thought she needed 20-30 mm compression stockings. She tells me that she has been ill with hospitalization secondary to asthma and is been found to have severe hypokalemia likely secondary to a combination of Lasix and metolazone. This morning she noted blistering and leaking fluid on the posterior part of her left leg. She called our intake nurse urgently and we was saw her this afternoon. She has not had any real discomfort here. I don't know that she's been wearing any stockings on this leg for at least 2-3 days. ABIs in this clinic were 1.21 on the right and 1.3 on the left. She is previously seen vascular surgery who does not think that there is a peripheral arterial issue. 01/30/17; Patient arrives with no open wound on the left leg. She has been to elastic therapy and obtained 20-16mhg below knee stockings and she has one on the right leg today. READMISSION 02/19/18; this PShadeis  a now 75 year old patient we've had in this clinic perhaps 3 times before. I had last looked at her from January 07 December 2016 with an area on the medial left leg. We discharged her on 12/25/16 however she had to be readmitted on 01/22/17 with a recurrence. I have in my notes that we discharged her on 20-30 mm stockings although she tells me she was only wearing support  hose because she cannot get stockings on predominantly related to her cervical spine surgery/issues. She has had previous ablations done by vein and vascular in [place] including a great saphenous vein ablation on the left with an anterior accessory branch ablation I think both of these were in 2016. On one of the previous visit she had a biopsy noted 2009 that was negative. She is not felt to have an arterial issue. She is not a diabetic. She does have a history of obstructive sleep apnea hypertension asthma as well as chronic venous insufficiency and lymphedema. On this occasion she noted 2 dry scaly patch on her left leg. She tried to put lotion on this it didn't really help. There were 2 open areas.the patient has been seeing her primary physician from 02/05/18 through 02/14/18. She had Unna boots applied. The superior wound now on the lateral left leg has closed but she's had one wound that remains open on the lateral left leg. This is not the same spot as we dealt with in 2018. ABIs in this clinic were 1.3 bilaterally FAITHE, HILDRETH ([id number]) 02/26/18; patient has a small wound on the left lateral calf. Dimensions are down. She has chronic venous insufficiency and lymphedema. 03/05/18; small open area on the left lateral calf. Dimensions are down. Tightly adherent necrotic debris over the surface of the wound which was difficult to remove. Also the dressing [over collagen] stuck to the wound surface. This was removed with some difficulty as well. Change the primary dressing to Hydrofera Blue ready 03/12/18; small open area on the left lateral calf. Comes in with tightly adherent surface eschar as well as some adherent Hydrofera Blue. 03/19/18; open area on the left lateral calf. Again adherent surface eschar as well as some adherent Hydrofera Blue nonviable subcutaneous tissue. She complained of pain all week even with the reduction from 4-3 layer compression I put on last week. Also she had an  increase in her ankle and calf measurements probably related to the same thing. 03/26/18; open area on the left lateral calf. A very small open area remains here. We used silver alginate starting last week as the Hydrofera Blue seem to stick to the wound bed. In using 4-layer compression 04/02/18; the open area in the left lateral calf at some adherent slough which I removed there is no open area here. We are able to transition her into her own compression stocking. Truthfully I think this is probably his support hose. However this does not maintain skin integrity will be limited. She cannot put over the toe compression stockings on because of neck problems hand problems etc. She is allergic to the lining layer of juxta lites. We might be forced to use extremitease stocking should this fail READMIT 11/24/2018 Patient is now a 75 year old woman who is not a diabetic. She has been in this clinic on at least 3 previous occasions largely with recurrent wounds on her left leg secondary to chronic venous insufficiency with secondary lymphedema. Her situation is complicated by inability to get stockings on and an allergy to neoprene which is  apparently a component and at least juxta lites and other stockings. As a result she really has not been wearing any stockings on her legs. She tells [place] that roughly 2 or 3 weeks ago she started noticing a stinging sensation just above her ankle on the left medial aspect. She has been diagnosed with pseudogout and she wondered whether this was what she was experiencing. She tried to dress this with something she bought at the store however subsequently it pulled skin off and now she has an open wound that is not improving. She has been using Vaseline gauze with a cover bandage. She saw her primary doctor last week who put an Haematologist on her. ABIs in this clinic was 1.03 on the left 2/12; the area is on the left medial ankle. Odd-looking wound with what looks to be  surface epithelialization but a multitude of small petechial openings. This clearly not closed yet. We have been using silver alginate under 3 layer compression with TCA 2/19; the wound area did not look quite as good this week. Necrotic debris over the majority of the wound surface which required debridement. She continues to have a multitude of what looked to be small petechial openings. She reminds [place] that she had a biopsy on this initially during her first outbreak in 2015 in [place] dermat[facility]. She expresses concern about this being a possible melanoma. 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 I assured her that this does not look anything like melanoma. Except for the petechial reaction it does look like a venous insufficiency area and she certainly has evidence of this on both sides 2/26; a difficult area on the left medial ankle. The patient clearly has chronic venous hypertension with some degree of lymphedema. The odd thing about the area is the small petechial hemorrhages. I am not really sure how to explain this. This was present last time and this is not a compression injury. We have been using Hydrofera Blue which I changed to last week 3/4; still using Hydrofera Blue. Aggressive debridement today. She does not have known arterial issues. She has seen Dr. Kellie Simmering at [facility] vein and vascular and and has an ablation on the left. [Anterior accessory branch of the greater saphenous]. From what I remember they did not feel she had an arterial issue. The patient has had this area biopsied in 2009 at [facility] dermatology and by her recollection they said this was "stasis". She is also follow-up with dermatology locally who thought that this was more of a vascular issue 3/11; using Hydrofera Blue. Aggressive debridement today. She does not have an arterial issue. We are using 3 layer compression  although we may need to go to 4. The patient has been in for multiple changes to her wrap since I last saw her a week ago. She says that the area was leaking. I do not have too much more information on what was found 01/19/19 on evaluation today patient was actually being seen for a nurse visit when unfortunately she had the area on her left lateral lower extremity as well as weeping from the right lower extremity that became apparent. Therefore we did end up actually seeing her for a full visit with myself. She is having some pain at this site as well but fortunately nothing too significant at this point. No fevers, chills, nausea, or vomiting noted at this time. 3/18-Patient is back to the  clinic with the left leg venous leg ulcer, the ulcer is larger in size, has a surface that is densely adherent with fibrinous tissue, the Hydrofera Blue was used but is densely adherent and there was difficulty in removing it. The right lower extremity was also wrapped for weeping edema. Patient has a new area over the left lateral foot above the malleolus that is small and appears to have no debris with intact surrounding skin. Patient is on increased dose of Lasix also as a means to edema management 3/25; the patient has a nonhealing venous ulcer on the medial left leg and last week developed a smaller area on the lateral left calf. We have been using Hydrofera Blue with a contact layer. 4/1; no major change in these wounds areas. Left medial and more recently left lateral calf. I tried Iodoflex last week to aid in debridement she did not tolerate this. She stated her pain was terrible all week. She took the top layer of the 4 layer compression off. 4/8; the patient actually looks somewhat better in terms of her more prominent left lateral calf wound. There is some healthy looking tissue here. She is still complaining of a lot of discomfort. 4/15; patient in a lot of pain secondary to sciatica. She is on a  prednisone taper prescribed by her primary physician. She has the 2 areas one on the left medial and more recently a smaller area on the left lateral calf. Both of these just above the malleoli 4/22; her back pain is better but she still states she is very uncomfortable and now feels she is intolerant to the [employer]. No real change in the wounds we have been using Sorbact. She has been previously intolerant to Iodoflex. There is not a lot of option about what we can use to debride this wound under compression that she no doubt needs. sHe states Ultram no longer works for her pain 4/29; no major change in the wounds slightly increased depth. Surface on the original medial wound perhaps somewhat improved however the more recent area on the lateral left ankle is 100% covered in very adherent debris we have been using Sorbact. She tolerates 4 layer compression well and her edema control is a lot better. She has not had to come in for a nurse check 5/6; no major change in the condition of the wounds. She did consent to debridement today which was done with some difficulty. Continuing Sorbact. She did not tolerate Iodoflex. She was in for a check of her compression the day after we wrapped her last week this was adjusted but nothing much was found 5/13; no major change in the condition or area of the wounds. I was able to get a fairly aggressive debridement done on the lateral left leg wound. Even using Sorbact under compression. She came back in on Friday to have the wrap changed. She says she felt uncomfortable on the lateral aspect of her ankle. She has a long history of chronic venous insufficiency including previous ablation surgery on this side. 5/20-Patient returns for wounds on left leg with both wounds covered in slough, with the lateral leg wound larger in size, she has been in 3 layer compression and felt more comfortable, she describes pain in ankle, in leg and pins and needles in foot, and is  about to try Pamelor for this 6/3; wounds on the left lateral and left medial leg. The area medially which is the most recent of the 2 seems to  have had the largest increase in [place], [facility]. ([name]) dimensions. We have been using Sorbac to try and debride the surface. She has been to see orthopedics they apparently did a plain x-ray that was indeterminant. Diagnosed her with neuropathy and they have ordered an MRI to determine if there is underlying osteomyelitis. This was not high on my thought list but I suppose it is prudent. We have advised her to make an appointment with vein and vascular in [place]. She has a history of a left greater saphenous and accessory vein ablations I wonder if there is anything else that can be done from a surgical point of view to help in these difficult refractory wounds. We have previously healed this wound on one occasion but it keeps on reopening [medial side] 6/10; deep tissue culture I did last week I think on the left medial wound showed both moderate E. coli and moderate staph aureus [MSSA]. She is going to require antibiotics and I have chosen Augmentin. We have been using Sorbact and we have made better looking wound surface on both sides but certainly no improvement in wound area. She was back in last Friday apparently for a dressing changes the wrap was hurting her outer left ankle. She has not managed to get a hold of vein and vascular in [place]. We are going to have to make her that appointment 6/17; patient is tolerating the Augmentin. She had an MRI that I think was ordered by orthopedic surgeon this did not show osteomyelitis or an abscess did suggest cellulitis. We have been using Sorbact to the lateral and medial ankles. We have been trying to arrange a follow-up appointment with vein and vascular in [place] or did her original ablations. We apparently an area sent the request to vein and vascular in [facility] 6/24; patient has  completed the Augmentin. We do not yet have a vein and vascular appointment in [place]. I am not sure what the issue is here we have asked her to call tomorrow. We are using Sorbact. Making some improvements and especially the medial wound. Both surfaces however look better medial and lateral. 7/1; the patient has been in contact with vein and vascular in [place] but has not yet received an appointment. Using Sorbact we have gradually improve the wound surface with no improvement in surface area. She is approved for Apligraf but the wound surface still is not completely viable. She has not had to come in for a dressing change 7/8; the patient has an appointment with vein and vascular on 7/31 which is a Friday afternoon. She is concerned about getting back here for [place] to dress her wounds. I think it is important to have them goal for her venous reflux/history of ablations etc. to see if anything else can be done. She apparently tested positive for 1 of the blood tests with regards to lupus and saw a rheumatologist. He has raised the issue of vasculitis again. I have had this thought in the past however the evidence seems overwhelming that this is a venous reflux etiology. If the rheumatologist tells me there is clinical and laboratory investigation is positive for lupus I will rethink this. 7/15; the patient's wound surfaces are quite a bit better. The medial area which was her original wound now has no depth although the lateral wound which was the more recent area actually appears larger. Both with viable surfaces which is indeed better. Using Sorbact. I wanted to use Apligraf on her however there is the  issue of the vein and vascular appointment on 7/31 at 2:00 in the afternoon which would not allow her to get back to be rewrapped and they would no doubt remove the graft 7/22; the patient's wound surfaces have moderate amount of debris although generally look better. The lateral one is larger  with 2 small satellite areas superiorly. We are waiting for her vein and vascular appointment on 7/31. She has been approved for Apligraf which I would like to use after th 7/29; wound surfaces have improved no debridement is required we have been using Sorbact. She sees vein and vascular on Friday with this so question of whether anything can be done to lessen the likelihood of recurrence and/or speed the healing of these areas. She is already had previous ablations. She no doubt has severe venous hypertension 8/5-Patient returns at 1 week, she was in [place] for 3 days by her podiatrist, we have been using so backed to the wound, she has increased pain in both the wounds on the left lower leg especially the more distal one on the lateral aspect 8/12-Patient returns at 1 week and she is agreeable to having debridement in both wounds on her left leg today. We have been using Sorbact, and vascular studies were reviewed at last visit 8/19; the patient arrives with her wounds fairly clean and no debridement is required. We have used Sorbact which is really done a nice job in cleaning up these very difficult wound surfaces. The patient saw Dr. Donzetta Matters of vascular surgery on 7/31. He did not feel that there was an arterial component. He felt that her treated greater saphenous vein is adequately addressed and that the small saphenous vein did not appear to be involved significantly. She was also noted to have deep venous reflux which is not treatable. Dr. Donzetta Matters mentioned the possibility of a central obstructive component leading to reflux and he offered her central venography. She wanted to discuss this or think about it. I have urged her to go ahead with this. She has had recurrent difficult wounds in these areas which do heal but after months in the clinic. If there is anything that can be done to reduce the likelihood of this I think it is worth it. 9/2 she is still working towards getting follow-up with  Dr. Donzetta Matters to schedule her CT. Things are quite a bit worse venography. I put Apligraf on 2 weeks ago on both wounds on the medial and lateral part of her left lower leg. She arrives in clinic today with 3 superficial additional wounds above the area laterally and one below the wound medially. She describes a lot of discomfort. I think these are probably wrapped injuries. Does not look like she has cellulitis. 07/20/2019 on evaluation today patient appears to be doing somewhat poorly in regard to her lower extremity ulcers. She in fact showed signs of erythema in fact we may even be dealing with an infection at this time. Unfortunately I am unsure if this is just infection or if indeed there may be some allergic reaction that occurred as a result of the Apligraf application. With that being said that would be unusual but nonetheless not impossible in this patient is one who is unfortunately allergic to quite a bit. Currently we have been using the Sorbact which seems to do as well as anything for her. I do think we may want to obtain a culture today to see if there is anything showing up there that may need  to be addressed. 9/16; noted that last week the wounds look worse in 1 week follow-up of the Apligraf. Using Sorbact as of 2 days ago. She arrives with copious amounts of drainage and new skin breakdown on the back of the left calf. The wounds arm more substantial bilaterally. There is a fair amount of swelling in the left calf no overt DVT there is edema present I think in the left greater than right thigh. She is supposed to go on 9/28 for CT venography. The wounds on the medial and lateral calf are worse and she has new skin breakdown posteriorly at least new for me. This is almost developing into a circumferential wound area The Apligraf was taken off last week which I agree with things are not going in the right direction a culture was done we do not have that back yet. She is on Augmentin that  she started 2 days ago 9/23; dressing was changed by her nurses on Monday. In general there is no improvement in the wound areas although the area looks less angry than last week. She did get Augmentin for MSSA cultured on the 14th. She still appears to have too much swelling in the left leg even with 3 layer compression 9/30; the patient underwent her procedure on 9/28 by Dr. Donzetta [facility]ters at [facility]. She was discovered to have the common iliac vein measuring 12.2 mm but at the level of L4-L5 measured 3 mm. After stenting it measured 10 mm. 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 Rouleaux flow in the common femoral and femoral vein was observed much improved after stenting. We are using silver alginate to the wounds on the medial and lateral ankle on the left. 4 layer compression 10/7; the patient had fluid swelling around her knee and 4 layer compression. At the advice of vein and vascular this was reduced to 3 layer which she is tolerating better. We have been using silver alginate under 3 layer compression since last Friday 10/14; arrives with the areas on the left ankle looking a lot better. Inflammation in the area also a lot better. She came in for a nurse check on 10/9 10/21; continued nice improvement. Slight improvements in surface area of both the medial and lateral wounds on the left. A lot of the satellite lesions in the weeping erythema around these from stasis dermatitis is resolved. We have been using silver alginate [name], [name] 10/28; general improvement in the entire wound areas although not a lot of change in dimensions the wound certainly looks better. There is a lot less in terms of venous inflammation. Continue silver alginate this week however look towards Hydrofera Blue next week 11/4; very adherent debris on the medial wound left wound is not as bad. We have been using silver alginate. Change to [facility] today 11/11;  very adherent debris on both wound areas. She went to vein and vascular last week and follow-up they put in [place] boot on this today. He says the [facility] was adherent. Wound is definitely not as good as last week. Especially on the left there the satellite lesions look more prominent 11/18; absolutely no better. erythema on lateral aspect with tenderness. 09/30/2019 on evaluation today patient appears to actually be doing better. Dr. Dellia Nims did put her on doxycycline last week which I do believe has helped her at this point. Fortunately there is no signs of active infection at this time. No fevers, chills, nausea, vomiting,  or diarrhea. I do believe he may want extend the doxycycline for 7 additional days just to ensure everything does completely cleared up the patient is in agreement with that plan. Otherwise she is going require some sharp debridement today 12/2; patient is completing a 2-week course of doxycycline. I gave her this empirically for inflammation as well as infection when I last saw her 2 weeks ago. All of this seems to be better. She is using silver alginate she has the area on the medial aspect of the larger area laterally and the 2 small satellite regions laterally above the major wound. 12/9; the patient's wound on the left medial and left lateral calf look really quite good. We have been using silver alginate. She saw vein and vascular in follow-up on 10/09/2019. She has had a previous left greater saphenous vein ablation by Dr. Oscar La in 2016. More recently she underwent a left common iliac vein stent by Dr. 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 The swelling is improved and certainly the wounds have improved. The patient shows [place] today area on the right medial calf there is almost no wound but leaking lymphedema. She says she start this started 3 or 4 days ago. She did not traumatize it. It is not painful. She does not wear compression on that side 12/16; the  patient continues to do well laterally. Medially still requiring debridement. The area on the right calf did not materialize to anything and is not currently open. We wrapped this last time. She has support stockings for that leg although I am not sure they are going to provide adequate compression 12/23; the lateral wound looks stable. Medially still requiring debridement for tightly adherent fibrinous debris. We've been using silver alginate. Surface area not any different 12/30; neither wound is any better with regards to surface and the area on the left lateral is larger. I been using silver alginate to the left lateral which look quite good last week and Sorbact to the left medial 11/11/2019. Lateral wound area actually looks better and somewhat smaller. Medial still requires a very aggressive debridement today. We have been using Sorbact on both wound areas 1/13; not much better still adherent debris bilaterally. I been using Sorbact. She has severe venous hypertension. Probably some degree of dermal fibrosis distally. I wonder whether tighter compression might help and I am going to try that today. We also need to work on the bioburden 1/20; using Sorbact. She has severe venous hypertension status post stent placement for pelvic vein compression. We applied gentamicin last time to see if we could reduce bioburden I had some discussion with her today about the use of pentoxifylline. This is occasionally used in this setting for wounds with refractory venous insufficiency. However this interacts with Plavix. She tells me that she was put on this after stent placement for 3 months. She will call Dr. Claretha Cooper office to discuss 1/27; we are using gentamicin under Sorbact. 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 She has a stent. Wound medially is measuring smaller this week. Laterally measuring slightly larger although she has some satellite lesions superiorly 2/3;  gentamicin under Sorbact under 4-layer compression. 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 She has a stent on Plavix. Her wounds are measuring smaller this week. More substantially laterally where there is a satellite lesion superiorly. 2/10; gentamicin under Sorbac. 4-layer compression. Patient communicated with Dr. Donzetta Matters at vein and vascular in [place]. He is okay with  the patient coming off Plavix I will therefore start her on pentoxifylline for a 1 month trial. In general her wounds look better today. I had some concerns about swelling in the left thigh however she measures 61.5 on the right and 63 on the mid thigh which does not suggest there is any difficulty. The patient is not describing any pain. 2/17; gentamicin under Sorbac 4-layer compression. She has been on pentoxifylline for 1 week and complains of loose stool. No nausea she is eating and drinking well 2/24; the patient apparently came in 2 days ago for a nurse visit when her wrap fell down. Both areas look a little worse this week macerated medially and satellite lesions laterally. Change to silver alginate today 3/3; wounds are larger today especially medially. She also has more swelling in her foot lower leg and I even noted some swelling in her posterior thigh which is tender. I wonder about the patency of her stent. Fortuitously she sees Dr. Claretha Cooper group on Friday 3/10; Mrs. Delpino was seen by vein and vascular on 3/5. The patient underwent ultrasound. There was no evidence of thrombosis involving the IVC no evidence of thrombosis involving the right common iliac vein there is no evidence of thrombosis involving the right external iliac vein the left external vein is also patent. The right common iliac vein stent appears patent bilateral common femoral veins are compressible and appear patent. I was concerned about the left common iliac stent however it looks like this is functional. She has some  edema in the posterior thigh that was tender she still has that this week. I also note they had trouble finding the pulses in her left foot and booked her for an ABI baseline in 4 weeks. She will follow up in 6 months for repeat IVC duplex. The patient stopped the pentoxifylline because of diarrhea. It does not look like that was being effective in any case. I have advised her to go back on her aspirin 81 mg tablet, vascular it also suggested this 3/17; comes in today with her wound surfaces a lot better. The excoriations from last week considerably better probably secondary to the TCA. We have been using silver alginate 3/24; comes in today with smaller wounds both medially and laterally. Both required debridement. There are 2 small satellite areas superiorly laterally. She also has a very odd bandlike area in the mid calf almost looking like there was a weakness in the wrap in a localized area. I would write this off as being this however anteriorly she has a small raised ballotable area that is very tender almost reminiscent of an abscess but there was no obvious purulent surface to it. 02/04/20 upon evaluation today patient appears to be doing fairly well in regard to her wounds today. Fortunately there is no signs of active infection at this time. No fevers, chills, nausea, vomiting, or diarrhea. She has been tolerating the dressing changes without complication. Fortunately I feel like she is showing signs of improvement although has been sometime since have seen her. Nonetheless the area of concern that Dr. Dellia Nims had last week where she had possibly an area of the wrap that was we can allow the leg to bulge appears to be doing significantly better today there is no signs of anything worsening. [name], [name] ([name]) 4/7; the patient's wounds on her medial and lateral left leg continue to contract. We have been using a regular alginate. Last week she developed an area on the right  medial lower leg which is probably a venous ulcer as well. 4/14; the wounds on her left medial and lateral lower leg continue to contract. Surface eschar. We have been using regular alginate. The area on the right medial lower leg is closed. We have been putting both legs under 4-layer contraction. The patient went back to see vein and vascular she had arterial studies done which were apparently "quite good" per the patient although I have not read their notes I have never felt she had an arterial issue. 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 This is been longstanding and refractory to exercise, leg elevation and longstanding use of compression wraps in our clinic as well as compression stockings on the times we have been able to get these to heal 4/21; we thought she actually might be close this week however she arrives in clinic with a lot of edema in her upper left calf and into her posterior thigh. This is been an intermittent problem here. She says the wrap fell down but it was replaced with a nurse visit on Monday. We are using calcium alginate to the wounds and the wound sizes there not terribly larger than last week but there is a lot more edema 4/28; again wound edges are smaller on both sides. Her edema is better controlled than last time. She is obtained her compression pumps from [employer] although they have not been to her home to set these up. 5/5; left medial and left lateral both look stable. I am not sure the medial is any smaller. We have been using calcium alginate under 4-layer compression. oShe had an area on the right medial. This was eschared today. We have been wrapping this as well. She does not tolerate external compression stockings due to a history of various contact allergies. She has her compression pumps however the representative from the company is coming on her to show her how to use these tomorrow 5/19;  patient with severe chronic venous insufficiency secondary to central venous disease. She had a stent placed in her left common iliac vein. She has done better since but still difficult to control wounds. She comes in today with nothing open on the right leg. Her areas on the left medial and left lateral are just about closed. We are using calcium alginate under 4-layer compression. She is using her external compression pumps at home She only has 15-20 support stockings. States she cannot get anything tighter than that on. 03/30/20-Patient returns at 1 week, the wounds on the left leg are both slightly bigger, the last week she was on 3 layer compression which started to slide down. She is starting to use her lymphedema pumps although she stated on 1 day her right ankle started to swell up and she have to stop that day. Unfortunately the open area seem to oscillate between improving to the point of healing and then flaring up all to do with effectiveness of compression or lack of due to the left leg topography not keeping the compression wraps from rolling down 6/2 patient comes in with a 15/20 mmHg stocking on the right leg. She tells me that she developed a lot of swelling in her ankles she saw orthopedics she was felt to possibly be having a flare of pseudogout versus some other type of arthritis. She was put on steroids for a respiratory issue so that helps with the inflammation. She has not been using the pumps all week. She thinks  the left thigh is more swollen than usual and I would agree with that. She has an appointment with Dr. Donzetta Matters 9 days or so from now 6/9; both wounds on the left medial and left lateral are smaller. We have been using calcium alginate under compression. She does not have an open wound on the right leg she is using a stocking and her compression pumps things are going well. She has an appointment with Dr. Donzetta Matters with regards to her stent in the left common iliac vein 6/16;  the wounds on the left medial and left lateral ankle continues to contract. The patient saw Dr. Donzetta Matters and I think he seems satisfied. Ordered follow-up venous reflux studies on both sides in September. Cautioned that she may need thigh-high stockings. She has been using calcium alginate under compression on the left and her own stocking on the right leg. She tells [place] there are no open wounds on the right 6/23; left lateral is just about closed. Medial required debridement today. We have been using calcium alginate. Extensive discussion about the compression pumps she is only using these on 25 mmHg states she could not take 40 or 30 when the wrap came out to her home to demonstrate these. He said they should not feel tight 6/30; the left lateral wound has a slight amount of eschar. . The area medially is about the same using Hydrofera Blue. 7/7; left lateral wound still has some eschar. I will remove this next week may be closed. The area medially is very small using Hydrofera Blue with improvement. Unfortunately the stockings fell down. Unfortunately the blisters have developed at the edge of where the wrap fell. When this happened she says her legs hurt she did not use her pumps. We are not open Monday for her to come in and change the wraps and she had an appointment yesterday. She also tells me that she is going to have an MRI of her back. She is having pain radiating into her left anterior leg she thinks her from an L5 disc. She saw Dr. Ellene Route of neurosurgery 7/14; the area on the left lateral ankle area is closed. Still a small area medially however it looks better as well. We have been using Hydrofera Blue under 4-layer compression 7/21; left lateral ankle is still closed however her wound on the medial left calf is actually larger. This is probably because Hydrofera Blue got stuck to the wound. She came in for a nurse change on Friday and will do that again this week I was concerned about the  amount of swelling that she had last week however she is using her compression pumps twice a day and the swelling seems well controlled 7/28; remaining wound on the left medial lower leg is smaller. We have been using moistened silver collagen under compression she is coming back for a nurse visit. For reasons that were not really clear she was just keeping her legs elevated and not using her compression pumps. I have asked her to use the compression pumps. She does not have any wounds on the right leg 06/15/20-Patient returns at 2 weeks, her LLE edema is worse and she developed a blister wound that is new and has bigger posterior calf wound on right, we are using Prisma with pad, 4 layer compression. she has been on lasix 40 mg daily 8/18; patient arrives today with things a lot worse than I remember from a few weeks ago. She was seen last week. Noted that  her edema was worse and that she now had a left lateral wound as well as deteriorating edema in the medial and posterior part of the lower leg. She says she is using his or her external compression pumps once a day although I wonder about the compliance. 8/25; weeping area on the right medial lower leg. This had actually gotten a small localized area of her compression stocking wet. oOn the left side there is a large denuded area on the posterior medial lower leg and smaller area on the lateral. This was not the original areas that we dealt with. 9/1 the patient's wound on the left leg include the left lateral and left posterior. Larger superficial wounds weeping. She has very poor edema control. Tender localized edema in the left lower medial ankle/heel probably because of localized wrap issues. She freely admits she is not using the compression pumps. She has been up on her feet a lot. She thinks the hydrofera blue is contributing to the pain she is experiencing.. This is a complaint that I have occasionally heard 9/8; really not much  improvement. The patient is still complaining of a lot of pain particularly when she uses compression pumps. I switched her to silver alginate last time because she found the Hydrofera Blue to be irritating. I don't hear much difference in her description with the silver alginate. 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 She has follow-up with Dr. Donzetta Matters I think for ultrasound next month ZORRIA, CLAPSADDLE ([id number]) 9/15; quite a bit of improvement today. We have less edema and more epithelialization in both of her wound areas on the left medial and left lateral calf. These are not the site of her original wounds in this area. She says she has been using her compression pumps for 30 minutes twice a day, there pain issues that never quite understood. Silver alginate as the primary dressing 9/22; continued improvement. Both areas medially and laterally still have a small open area there is some eschar. She continues to complain of left medial ankle pain. Swelling in the leg is in much better condition. We have been using silver alginate 9/29; continued improvement. Both areas medially and laterally in the left calf look as though they are close some minor surface eschar but I think this is epithelialized. She comes in today saying she has a ruptured disc at L4-L5 cannot bend over to put on her stockings. 10/6; patient comes in today with no open wounds on either leg. However her edema on the left leg in the upper one third of the lower leg is poorly controlled nonpitting. She says that she could not use the pumps for 2 days and then she has been using the last couple of days. It is not clear to me she has been able to get her stocking on. She has back problems. Mrs. Rowbotham has severe chronic venous insufficiency with secondary lymphedema. Her venous insufficiency is partially centrally mediated and that she is now post stent  in the left common iliac vein. [facility] Thurner's syndrome/physiology]. She follows up with them on 10/15. She wears 20/30 below-knee stockings. She is supposed to use compression pumps at home although I think her compliance about with this is been less than 100%. I have asked her to use these 3 times a day. Finally I think she has lipodermatosclerosis in the left lower leg with an inverted bottle sign.  It is been a major problem controlling the edema in the left leg. The right leg we have had wounds on but not as significant a problem is on the left READMISSION 04/12/2021 Mrs. Bartolucci is a 75 year old woman we know well in this clinic. She has severe chronic venous insufficiency. 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 I believe she has had bilateral greater saphenous vein ablation in the past as well. She tells me that this wound opened sometime in March. She had a fall and thinks it was initially abrasion. She developed areas she describes as little blisters on the anterior part of her leg and she saw dermatology and was treated for methicillin staph aureus with several rounds of antibiotics. She has been using support stockings on the left leg and says this is the only thing she can get on. Her compression pump use maybe once a day she says if she did not use one she use the other. She comes in today with incredible swelling in the left leg with a wound on the left posterior calf. She has been using Neosporin to this previously a hydrocolloid. 6/15; patient arrives back for 1 week follow-up.Marland Kitchen Apparently her wrap fell down she did not call us to replace this. He has poor edema control. She only uses her compression pumps once a day 6/29; patient presents for 1 week follow-up. She has tolerated the compression wrap well. We have been using Iodoflex under the wrap. She has no issues or complaints today. 7/6; patient presents for 1 week follow-up. She states  that the compression wrap rolled down her leg 4 days ago. She has been trying to keep the area covered but has no dressings at home to use. She denies signs of infection. 7/13; patient presents for 1 week follow-up. She states that her compression wrap rolled down her right leg and she called our office and had it placed a few days ago. She has been tolerating the current wrap well. She states that the Iodoflex is causing a burning sensation. She denies signs of infection. 7/27; patient presents for 1 week follow-up She has tolerated the compression wrap well with Hydrofera Blue underneath. She has now developed a wound to her right lower extremity. She reports having a culture done To this area by her dermatologist that she reports is negative. She currently denies signs of infection. 8/30; patient presents for 1 week follow-up. On the left side she has tolerated the compression wrap well with Hydrofera Blue underneath. She reports some discomfort to her right lower extremity with the 3 layer compression. She currently denies signs of infection. 06/14/2021 upon evaluation today patient's wound actually appears to be doing decently well based on what I am seeing currently. She actually has 2 areas 1 on the left distal/posterior lower leg and the other on the right lower leg. Subsequently the measurements are roughly the same may be slightly smaller but in general have not made any trend towards getting worse which is great news. Electronic Signature(s) Signed: 06/15/2021 5:28:22 PM By: Worthy Keeler PA-C Entered By: Worthy Keeler on 06/15/2021 17:28:22 AIYANNA, BAMBACH ([id number]) -------------------------------------------------------------------------------- Physical Exam Details Patient Name: [name], [name]. Date of Service: 06/14/2021 1:15 PM Medical Record Number: [id number] Patient Account Number: [id number] Date of Birth/Sex: [birth year] (75 y.o. F) Treating RN: Donnamarie Poag Primary Care  Provider: [name] Other Clinician: Referring Provider: [name] Treating Provider/Extender: [name] in Treatment:  9 Constitutional [name] and well-hydrated in no acute distress. Respiratory normal breathing without difficulty. Psychiatric this patient is able to make decisions and demonstrates good insight into disease process. Alert and Oriented x 3. pleasant and cooperative. Notes Upon inspection patient's wound bed actually showed signs of good granulation epithelization at this point. There does not appear to be any signs of active infection which is great news and overall extremely pleased with where things stand today. No fevers, chills, nausea, vomiting, or diarrhea. Electronic Signature(s) Signed: 06/15/2021 5:28:52 PM By: Worthy Keeler PA-C Entered By: Worthy Keeler on 06/15/2021 17:28:52 [name], [name] -------------------------------------------------------------------------------- Physician Orders Details Patient Name: [name], [name]. Date of Service: 06/14/2021 1:15 PM Medical Record Number: [id number] Patient Account Number: [id number] Date of Birth/Sex: 08/29/1946 (75 y.o. F) Treating RN: Donnamarie Poag Primary Care Provider: [name] Other Clinician: Referring Provider: [name] Treating Provider/Extender: [name] in Treatment: 9 Verbal / Phone Orders: No Diagnosis Coding ICD-10 Coding Code Description [phone] Chronic venous hypertension (idiopathic) with ulcer and inflammation of left lower extremity I89.0 Lymphedema, not elsewhere classified L97.221 Non-pressure chronic ulcer of left calf limited to breakdown of skin S81.801A Unspecified open wound, right lower leg, initial encounter Follow-up Appointments Wound #13 Left,Distal,Posterior Lower Leg o Return Appointment in 1 week. o Nurse Visit as needed Bathing/ Shower/ Hygiene o May shower with wound dressing protected with  water repellent cover or cast protector. Anesthetic (Use 'Patient Medications' Section for Anesthetic Order Entry) o Lidocaine applied to wound bed Edema Control - Lymphedema / Segmental Compressive Device / Other o Elevate, Exercise Daily and Avoid Standing for Long Periods of Time. o Elevate legs to the level of the heart and pump ankles as often as possible o Elevate leg(s) parallel to the floor when sitting. o Compression Pump: Use compression pump on left lower extremity for 60 minutes, twice daily. - TWICE DAILY for 1 hour o Compression Pump: Use compression pump on right lower extremity for 60 minutes, twice daily. - TWICE DAILY for 1 hour Wound Treatment Wound #13 - Lower Leg Wound Laterality: Left, Posterior, Distal Cleanser: Soap and Water 1 x Per Week/30 Days Discharge Instructions: Gently cleanse wound with antibacterial soap, rinse and pat dry prior to dressing wounds Topical: Triamcinolone Acetonide Cream, 0.1%, 15 (g) tube 1 x Per Week/30 Days Discharge Instructions: Peri wound Apply as directed by provider. Primary Dressing: AquacelAg Advantage Dressing, 4x5 (in/in) 1 x Per Week/30 Days Discharge Instructions: Apply to wound as directed Secondary Dressing: ABD Pad 5x9 (in/in) 1 x Per Week/30 Days Discharge Instructions: Cover with ABD pad Compression Wrap: Medichoice 4 layer Compression System, 35-40 mmHG 1 x Per Week/30 Days Discharge Instructions: Apply multi-layer wrap as directed. Wound #14 - Lower Leg Wound Laterality: Right Cleanser: Soap and Water 1 x Per Week/30 Days Discharge Instructions: Gently cleanse wound with antibacterial soap, rinse and pat dry prior to dressing wounds Topical: Triamcinolone Acetonide Cream, 0.1%, 15 (g) tube 1 x Per Week/30 Days Discharge Instructions: Peri wound Apply as directed by provider. Primary Dressing: AquacelAg Advantage Dressing, 4x5 (in/in) 1 x Per Week/30 Days Discharge Instructions: Apply to wound as  directed Secondary Dressing: ABD Pad 5x9 (in/in) 1 x Per Week/30 Days [name], [name] Discharge Instructions: Cover with ABD pad Compression Wrap: Profore Lite LF 3 Multilayer Compression Bandaging System 1 x Per Week/30 Days Discharge Instructions: Apply 3 multi-layer wrap as prescribed. Electronic Signature(s) Signed: 06/14/2021 4:15:45 PM By: Donnamarie Poag Signed: 06/15/2021 5:38:02 PM By: Joaquim Lai  III, Dmario Russom PA-C Entered By: Donnamarie Poag on 06/14/2021 14:30:46 Shahin, [name] -------------------------------------------------------------------------------- Problem List Details Patient Name: SHALIN, FRIDDLE. Date of Service: 06/14/2021 1:15 PM Medical Record Number: [id number] Patient Account Number: [id number] Date of Birth/Sex: 11-Oct-1946 (74 y.o. F) Treating RN: Donnamarie Poag Primary Care Provider: [name] Other Clinician: Referring Provider: [name] Treating Provider/Extender: [name] in Treatment: 9 Active Problems ICD-10 Encounter Code Description Active Date MDM Diagnosis I87.332 Chronic venous hypertension (idiopathic) with ulcer and inflammation of 04/12/2021 No Yes left lower extremity I89.0 Lymphedema, not elsewhere classified 04/12/2021 No Yes L97.221 Non-pressure chronic ulcer of left calf limited to breakdown of skin 04/12/2021 No Yes S81.801A Unspecified open wound, right lower leg, initial encounter 05/31/2021 No Yes Inactive Problems Resolved Problems Electronic Signature(s) Signed: 06/14/2021 2:06:11 PM By: Worthy Keeler PA-C Entered By: Worthy Keeler on 06/14/2021 14:06:11 Duzan, [name] -------------------------------------------------------------------------------- Progress Note Details Patient Name: MANERVIA, MUSCO. Date of Service: 06/14/2021 1:15 PM Medical Record Number: [id number] Patient Account Number: [id number] Date of Birth/Sex: 19-Sep-1946 (75 y.o. F) Treating RN: Donnamarie Poag Primary Care  Provider: [name] Other Clinician: Referring Provider: [name] Treating Provider/Extender: [name] in Treatment: 9 Subjective Chief Complaint Information obtained from Patient Left calf venous stasis ulceration. 11/13/16; the patient is here for a left calf recurrent ulceration which is painful and has been present for the last month 01/22/17; the patient re-presents here for recurrent difficulties with blistering and leaking fluid on her left posterior calf 12/10/18; the patient returns to clinic for a wound on the left medial calf 04/12/2021; left lower extremity wound, 05/31/21; right leg wound History of Present Illness (HPI) Pleasant 75 year old with history of chronic venous insufficiency. No diabetes or peripheral vascular disease. Left ABI 1.29. Questionable history of left lower extremity DVT. She developed a recurrent ulceration on her left lateral calf in December 2015, which she attributes to poor diet and subsequent lower extremity edema. She underwent endovenous laser ablation of her left greater saphenous vein in 2010. She underwent laser ablation of accessory branch of left GSV in April 2016 by Dr. Kellie Simmering at [facility]. She was previously wearing Unna boots, which she tolerated well. Tolerating 2 layer compression and cadexomer iodine. She returns to clinic for follow-up and is without new complaints. She denies any significant pain at this time. She reports persistent pain with pressure. No claudication or ischemic rest pain. No fever or chills. No drainage. READMISSION 11/13/16; this is a 75 year old woman who is not a diabetic. She is here for a review of a painful area on her left medial lower extremity. I note that she was seen here previously last year for wound I believe to be in the same area. At that time she had undergone previously a left greater saphenous vein ablation by Dr. Kellie Simmering and she had a ablation of the anterior accessory branch of the  left greater saphenous vein in March 2016. Seeing that the wound actually closed over. In reviewing the history with her today the ulcer in this area has been recurrent. She describes a biopsy of this area in 2009 that only showed stasis physiology. She also has a history of today malignant melanoma in the right shoulder for which she follows with Dr. 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 Do not see that she has ever had arterial studies  in the left leg. She tells me she has a follow-up with Dr. Kellie Simmering in roughly 10 days In any case she developed the reopening of this area roughly a month ago. On the background of this she describes rapidly increasing edema which has responded to Lasix 40 mg and metolazone 2.5 mg as well as the patient's lymph massage. She has been told she has both venous insufficiency and lymphedema but she cannot tolerate compression stockings 11/28/16; the patient saw Dr. Kellie Simmering recently. Per the patient he did arterial Dopplers in the office that did not show evidence of arterial insufficiency, per the patient he stated "treat this like an ordinary venous ulcer". She also saw her dermatologist Dr. Ronnald Ramp who felt that this was more of a vascular ulcer. In general things are improving although she arrives today with increasing bilateral lower extremity edema with weeping a deeper fluid through the wound on the left medial leg compatible with some degree of lymphedema 12/04/16; the patient's wound is fully epithelialized but I don't think fully healed. We will do another week of depression with Promogran and TCA however I suspect we'll be able to discharge her next week. This is a very unusual-looking wound which was initially a figure-of-eight type wound lying on its side surrounded by petechial like hemorrhage. She has had venous ablation on this side. She  apparently does not have an arterial issue per Dr. Kellie Simmering. She saw her dermatologist thought it was "vascular". Patient is definitely going to need ongoing compression and I talked about this with her today she will go to elastic therapy after she leaves here next week 12/11/16; the patient's wound is not completely closed today. She has surrounding scar tissue and in further discussion with the patient it would appear that she had ulcers in this area in 2009 for a prolonged period of time ultimately requiring a punch biopsy of this area that only showed venous insufficiency. I did not previously pickup on this part of the history from the patient. 12/18/16; the patient's wound is completely epithelialized. There is no open area here. She has significant bilateral venous insufficiency with secondary lymphedema to a mild-to-moderate degree she does not have compression stockings.. She did not say anything to me when I was in the room, she told our intake nurse that she was still having pain in this area. This isn't unusual recurrent small open area. She is going to go to elastic therapy to obtain compression stockings. 12/25/16; the patient's wound is fully epithelialized. There is no open area here. The patient describes some continued episodic discomfort in this area medial left calf. However everything looks fine and healed here. She is been to elastic therapy and caught herself 15-20 mmHg stockings, they apparently were having trouble getting 20-30 mm stockings in her size 01/22/17; this is a patient we discharged from the clinic a month ago. She has a recurrent open wound on her medial left calf. She had 15 mm support stockings. I told her I thought she needed 20-30 mm compression stockings. She tells me that she has been ill with hospitalization secondary to asthma and is been found to have severe hypokalemia likely secondary to a combination of Lasix and metolazone. This morning she noted blistering  and leaking fluid on the posterior part of her left leg. She called our intake nurse urgently and we was saw her this afternoon. She has not had any real discomfort here. I don't know that she's been wearing any stockings on this leg  for at least 2-3 days. ABIs in this clinic were 1.21 on the right and 1.3 on the left. She is previously seen vascular surgery who does not think that there is a peripheral arterial issue. 01/30/17; Patient arrives with no open wound on the left leg. She has been to elastic therapy and obtained 20-66mhg below knee stockings and she has one on the right leg today. READMISSION 02/19/18; this PReifis a now 75year old patient we've had in this clinic perhaps 3 times before. I had last looked at her from January 07 December 2016 with an area on the medial left leg. We discharged her on 12/25/16 however she had to be readmitted on 01/22/17 with a recurrence. I have in my notes that we discharged her on 20-30 mm stockings although she tells me she was only wearing support hose because she cannot get stockings on predominantly related to her cervical spine surgery/issues. [place] the left with an anterior accessory branch ablation I think both of these were in 2016. On one of the previous visit she had a biopsy noted 2009 that was negative. She is not felt to have an arterial issue. She is not a diabetic. She does have a PTATIJANA, LUEBBERS ([id number] history of obstructive sleep apnea hypertension asthma as well as chronic venous insufficiency and lymphedema. On this occasion she noted 2 dry scaly patch on her left leg. She tried to put lotion on this it didn't really help. There were 2 open areas.the patient has been seeing her primary physician from 02/05/18 through 02/14/18. She had Unna boots applied. The superior wound now on the lateral left leg has closed but she's had  one wound that remains open on the lateral left leg. This is not the same spot as we dealt with in 2018. ABIs in this clinic were 1.3 bilaterally 02/26/18; patient has a small wound on the left lateral calf. Dimensions are down. She has chronic venous insufficiency and lymphedema. 03/05/18; small open area on the left lateral calf. Dimensions are down. Tightly adherent necrotic debris over the surface of the wound which was difficult to remove. Also the dressing [over collagen] stuck to the wound surface. This was removed with some difficulty as well. Change the primary dressing to Hydrofera Blue ready 03/12/18; small open area on the left lateral calf. Comes in with tightly adherent surface eschar as well as some adherent Hydrofera Blue. 03/19/18; open area on the left lateral calf. Again adherent surface eschar as well as some adherent Hydrofera Blue nonviable subcutaneous tissue. She complained of pain all week even with the reduction from 4-3 layer compression I put on last week. Also she had an increase in her ankle and calf measurements probably related to the same thing. 03/26/18; open area on the left lateral calf. A very small open area remains here. We used silver alginate starting last week as the Hydrofera Blue seem to stick to the wound bed. In using 4-layer compression 04/02/18; the open area in the left lateral calf at some adherent slough which I removed there is no open area here. We are able to transition her into her own compression stocking. Truthfully I think this is probably his support hose. However this does not maintain skin integrity will be limited. She cannot put over the toe compression stockings on because of neck problems hand problems etc. She is allergic to the lining layer  of juxta lites. We might be forced to use extremitease stocking should this fail READMIT 11/24/2018 Patient is now a 75 year old woman who is not a diabetic. She has been in this clinic on at least 3  previous occasions largely with recurrent wounds on her left leg secondary to chronic venous insufficiency with secondary lymphedema. Her situation is complicated by inability to get stockings on and an allergy to neoprene which is apparently a component and at least juxta lites and other stockings. As a result she really has not been wearing any stockings on her legs. She tells [place] that roughly 2 or 3 weeks ago she started noticing a stinging sensation just above her ankle on the left medial aspect. She has been diagnosed with pseudogout and she wondered whether this was what she was experiencing. She tried to dress this with something she bought at the store however subsequently it pulled skin off and now she has an open wound that is not improving. She has been using Vaseline gauze with a cover bandage. She saw her primary doctor last week who put an Haematologist on her. ABIs in this clinic was 1.03 on the left 2/12; the area is on the left medial ankle. Odd-looking wound with what looks to be surface epithelialization but a multitude of small petechial openings. This clearly not closed yet. We have been using silver alginate under 3 layer compression with TCA 2/19; the wound area did not look quite as good this week. Necrotic debris over the majority of the wound surface which required debridement. She continues to have a multitude of what looked to be small petechial openings. She reminds [place] that she had a biopsy on this initially during her first outbreak in 2015 in [place] dermatology. She expresses concern about this being a possible melanoma. 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 I assured her that this does not look anything like melanoma. Except for the petechial reaction it does look like a venous insufficiency area and she certainly has evidence of this on both sides 2/26; a difficult area on  the left medial ankle. The patient clearly has chronic venous hypertension with some degree of lymphedema. The odd thing about the area is the small petechial hemorrhages. I am not really sure how to explain this. This was present last time and this is not a compression injury. We have been using Hydrofera Blue which I changed to last week 3/4; still using Hydrofera Blue. Aggressive debridement today. She does not have known arterial issues. She has seen Dr. Kellie Simmering at [facility] vascular and and has an ablation on the left. [Anterior accessory branch of the greater saphenous]. From what I remember they did not feel she had an arterial issue. The patient has had this area biopsied in 2009 at [facility] dermatology and by her recollection they said this was "stasis". She is also follow-up with dermatology locally who thought that this was more of a vascular issue 3/11; using Hydrofera Blue. Aggressive debridement today. She does not have an arterial issue. We are using 3 layer compression although we may need to go to 4. The patient has been in for multiple changes to her wrap since I last saw her a week ago. She says that the area was leaking. I do not have too much more information on what was found 01/19/19 on evaluation today patient was actually being seen for a nurse  visit when unfortunately she had the area on her left lateral lower extremity as well as weeping from the right lower extremity that became apparent. Therefore we did end up actually seeing her for a full visit with myself. She is having some pain at this site as well but fortunately nothing too significant at this point. No fevers, chills, nausea, or vomiting noted at this time. 3/18-Patient is back to the clinic with the left leg venous leg ulcer, the ulcer is larger in size, has a surface that is densely adherent with fibrinous tissue, the Hydrofera Blue was used but is densely adherent and there was difficulty in removing  it. The right lower extremity was also wrapped for weeping edema. Patient has a new area over the left lateral foot above the malleolus that is small and appears to have no debris with intact surrounding skin. Patient is on increased dose of Lasix also as a means to edema management 3/25; the patient has a nonhealing venous ulcer on the medial left leg and last week developed a smaller area on the lateral left calf. We have been using Hydrofera Blue with a contact layer. 4/1; no major change in these wounds areas. Left medial and more recently left lateral calf. I tried Iodoflex last week to aid in debridement she did not tolerate this. She stated her pain was terrible all week. She took the top layer of the 4 layer compression off. 4/8; the patient actually looks somewhat better in terms of her more prominent left lateral calf wound. There is some healthy looking tissue here. She is still complaining of a lot of discomfort. 4/15; patient in a lot of pain secondary to sciatica. She is on a prednisone taper prescribed by her primary physician. She has the 2 areas one on the left medial and more recently a smaller area on the left lateral calf. Both of these just above the malleoli 4/22; her back pain is better but she still states she is very uncomfortable and now feels she is intolerant to the [employer]. No real change in the wounds we have been using Sorbact. She has been previously intolerant to Iodoflex. There is not a lot of option about what we can use to debride this wound under compression that she no doubt needs. sHe states Ultram no longer works for her pain 4/29; no major change in the wounds slightly increased depth. Surface on the original medial wound perhaps somewhat improved however the more recent area on the lateral left ankle is 100% covered in very adherent debris we have been using Sorbact. She tolerates 4 layer compression well and her edema control is a lot better. She has not  had to come in for a nurse check 5/6; no major change in the condition of the wounds. She did consent to debridement today which was done with some difficulty. Continuing Sorbact. She did not tolerate Iodoflex. She was in for a check of her compression the day after we wrapped her last week this was adjusted but MARIELI, CLAES. ([name]) nothing much was found 5/13; no major change in the condition or area of the wounds. I was able to get a fairly aggressive debridement done on the lateral left leg wound. Even using Sorbact under compression. She came back in on Friday to have the wrap changed. She says she felt uncomfortable on the lateral aspect of her ankle. She has a long history of chronic venous insufficiency including previous ablation surgery on this side.  5/20-Patient returns for wounds on left leg with both wounds covered in slough, with the lateral leg wound larger in size, she has been in 3 layer compression and felt more comfortable, she describes pain in ankle, in leg and pins and needles in foot, and is about to try Pamelor for this 6/3; wounds on the left lateral and left medial leg. The area medially which is the most recent of the 2 seems to have had the largest increase in dimensions. We have been using Sorbac to try and debride the surface. She has been to see orthopedics they apparently did a plain x-ray that was indeterminant. Diagnosed her with neuropathy and they have ordered an MRI to determine if there is underlying osteomyelitis. This was not high on my thought list but I suppose it is prudent. We have advised her to make an appointment with vein and vascular in [place]. She has a history of a left greater saphenous and accessory vein ablations I wonder if there is anything else that can be done from a surgical point of view to help in these difficult refractory wounds. We have previously healed this wound on one occasion but it keeps on reopening [medial side] 6/10;  deep tissue culture I did last week I think on the left medial wound showed both moderate E. coli and moderate staph aureus [MSSA]. She is going to require antibiotics and I have chosen Augmentin. We have been using Sorbact and we have made better looking wound surface on both sides but certainly no improvement in wound area. She was back in last Friday apparently for a dressing changes the wrap was hurting her outer left ankle. She has not managed to get a hold of vein and vascular in [place]. We are going to have to make her that appointment 6/17; patient is tolerating the Augmentin. She had an MRI that I think was ordered by orthopedic surgeon this did not show osteomyelitis or an abscess did suggest cellulitis. We have been using Sorbact to the lateral and medial ankles. We have been trying to arrange a follow-up appointment with vein and vascular in [place] or did her original ablations. We apparently an area sent the request to vein and vascular in [facility] 6/24; patient has completed the Augmentin. We do not yet have a vein and vascular appointment in [place]. I am not sure what the issue is here we have asked her to call tomorrow. We are using Sorbact. Making some improvements and especially the medial wound. Both surfaces however look better medial and lateral. 7/1; the patient has been in contact with vein and vascular in [place] but has not yet received an appointment. Using Sorbact we have gradually improve the wound surface with no improvement in surface area. She is approved for Apligraf but the wound surface still is not completely viable. She has not had to come in for a dressing change 7/8; the patient has an appointment with vein and vascular on 7/31 which is a Friday afternoon. She is concerned about getting back here for [place] to dress her wounds. I think it is important to have them goal for her venous reflux/history of ablations etc. to see if anything else can be  done. She apparently tested positive for 1 of the blood tests with regards to lupus and saw a rheumatologist. He has raised the issue of vasculitis again. I have had this thought in the past however the evidence seems overwhelming that this is a venous reflux etiology. If the  rheumatologist tells me there is clinical and laboratory investigation is positive for lupus I will rethink this. 7/15; the patient's wound surfaces are quite a bit better. The medial area which was her original wound now has no depth although the lateral wound which was the more recent area actually appears larger. Both with viable surfaces which is indeed better. Using Sorbact. I wanted to use Apligraf on her however there is the issue of the vein and vascular appointment on 7/31 at 2:00 in the afternoon which would not allow her to get back to be rewrapped and they would no doubt remove the graft 7/22; the patient's wound surfaces have moderate amount of debris although generally look better. The lateral one is larger with 2 small satellite areas superiorly. We are waiting for her vein and vascular appointment on 7/31. She has been approved for Apligraf which I would like to use after th 7/29; wound surfaces have improved no debridement is required we have been using Sorbact. She sees vein and vascular on Friday with this so question of whether anything can be done to lessen the likelihood of recurrence and/or speed the healing of these areas. She is already had previous ablations. She no doubt has severe venous hypertension 8/5-Patient returns at 1 week, she was in [place] for 3 days by her podiatrist, we have been using so backed to the wound, she has increased pain in both the wounds on the left lower leg especially the more distal one on the lateral aspect 8/12-Patient returns at 1 week and she is agreeable to having debridement in both wounds on her left leg today. We have been using Sorbact, and vascular studies were  reviewed at last visit 8/19; the patient arrives with her wounds fairly clean and no debridement is required. We have used Sorbact which is really done a nice job in cleaning up these very difficult wound surfaces. The patient saw Dr. Donzetta Matters of vascular surgery on 7/31. He did not feel that there was an arterial component. He felt that her treated greater saphenous vein is adequately addressed and that the small saphenous vein did not appear to be involved significantly. She was also noted to have deep venous reflux which is not treatable. Dr. Donzetta Matters mentioned the possibility of a central obstructive component leading to reflux and he offered her central venography. She wanted to discuss this or think about it. I have urged her to go ahead with this. She has had recurrent difficult wounds in these areas which do heal but after months in the clinic. If there is anything that can be done to reduce the likelihood of this I think it is worth it. 9/2 she is still working towards getting follow-up with Dr. Donzetta Matters to schedule her CT. Things are quite a bit worse venography. I put Apligraf on 2 weeks ago on both wounds on the medial and lateral part of her left lower leg. She arrives in clinic today with 3 superficial additional wounds above the area laterally and one below the wound medially. She describes a lot of discomfort. I think these are probably wrapped injuries. Does not look like she has cellulitis. 07/20/2019 on evaluation today patient appears to be doing somewhat poorly in regard to her lower extremity ulcers. She in fact showed signs of erythema in fact we may even be dealing with an infection at this time. Unfortunately I am unsure if this is just infection or if indeed there may be some allergic reaction that occurred  as a result of the Apligraf application. With that being said that would be unusual but nonetheless not impossible in this patient is one who is unfortunately allergic to quite a bit.  Currently we have been using the Sorbact which seems to do as well as anything for her. I do think we may want to obtain a culture today to see if there is anything showing up there that may need to be addressed. 9/16; noted that last week the wounds look worse in 1 week follow-up of the Apligraf. Using Sorbact as of 2 days ago. She arrives with copious amounts of drainage and new skin breakdown on the back of the left calf. The wounds arm more substantial bilaterally. There is a fair amount of swelling in the left calf no overt DVT there is edema present I think in the left greater than right thigh. She is supposed to go on 9/28 for CT venography. The wounds on the medial and lateral calf are worse and she has new skin breakdown posteriorly at least new for me. This is almost developing into a circumferential wound area The Apligraf was taken off last week which I agree with things are not going in the right direction a culture was done we do not have that back yet. She is on Augmentin that she started 2 days ago 9/23; dressing was changed by her nurses on Monday. In general there is no improvement in the wound areas although the area looks less angry than last week. She did get Augmentin for MSSA cultured on the 14th. She still appears to have too much swelling in the left leg even with 3 layer compression 9/30; the patient underwent her procedure on 9/28 by Dr. Donzetta Matters at vascular and vein specialist. She was discovered to have the common iliac vein measuring 12.2 mm but at the level of L4-L5 measured 3 mm. After stenting it measured 10 mm. 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 Rouleaux flow in the common femoral and femoral vein was observed much improved after stenting. We are using silver alginate to the wounds on the medial and lateral ankle on the left. 4 layer compression SHALONNA, WIEDEL ([name]) 10/7; the patient had fluid swelling around her knee and 4 layer  compression. At the advice of vein and vascular this was reduced to 3 layer which she is tolerating better. We have been using silver alginate under 3 layer compression since last Friday 10/14; arrives with the areas on the left ankle looking a lot better. Inflammation in the area also a lot better. She came in for a nurse check on 10/9 10/21; continued nice improvement. Slight improvements in surface area of both the medial and lateral wounds on the left. A lot of the satellite lesions in the weeping erythema around these from stasis dermatitis is resolved. We have been using silver alginate 10/28; general improvement in the entire wound areas although not a lot of change in dimensions the wound certainly looks better. There is a lot less in terms of venous inflammation. Continue silver alginate this week however look towards Hydrofera Blue next week 11/4; very adherent debris on the medial wound left wound is not as bad. We have been using silver alginate. Change to [facility] today 11/11; very adherent debris on both wound areas. She went to vein and vascular last week and follow-up they put in [place] boot on this today. He says the [facility] was adherent. Wound is definitely not  as good as last week. Especially on the left there the satellite lesions look more prominent 11/18; absolutely no better. erythema on lateral aspect with tenderness. 09/30/2019 on evaluation today patient appears to actually be doing better. Dr. Dellia Nims did put her on doxycycline last week which I do believe has helped her at this point. Fortunately there is no signs of active infection at this time. No fevers, chills, nausea, vomiting, or diarrhea. I do believe he may want extend the doxycycline for 7 additional days just to ensure everything does completely cleared up the patient is in agreement with that plan. Otherwise she is going require some sharp debridement today 12/2; patient is completing a 2-week course  of doxycycline. I gave her this empirically for inflammation as well as infection when I last saw her 2 weeks ago. All of this seems to be better. She is using silver alginate she has the area on the medial aspect of the larger area laterally and the 2 small satellite regions laterally above the major wound. 12/9; the patient's wound on the left medial and left lateral calf look really quite good. We have been using silver alginate. She saw vein and vascular in follow-up on 10/09/2019. She has had a previous left greater saphenous vein ablation by Dr. Oscar La in 2016. More recently she underwent a left common iliac vein stent by Dr. 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 The swelling is improved and certainly the wounds have improved. The patient shows [place] today area on the right medial calf there is almost no wound but leaking lymphedema. She says she start this started 3 or 4 days ago. She did not traumatize it. It is not painful. She does not wear compression on that side 12/16; the patient continues to do well laterally. Medially still requiring debridement. The area on the right calf did not materialize to anything and is not currently open. We wrapped this last time. She has support stockings for that leg although I am not sure they are going to provide adequate compression 12/23; the lateral wound looks stable. Medially still requiring debridement for tightly adherent fibrinous debris. We've been using silver alginate. Surface area not any different 12/30; neither wound is any better with regards to surface and the area on the left lateral is larger. I been using silver alginate to the left lateral which look quite good last week and Sorbact to the left medial 11/11/2019. Lateral wound area actually looks better and somewhat smaller. Medial still requires a very aggressive debridement today. We have been using Sorbact on both wound areas 1/13; not much better still adherent debris  bilaterally. I been using Sorbact. She has severe venous hypertension. Probably some degree of dermal fibrosis distally. I wonder whether tighter compression might help and I am going to try that today. We also need to work on the bioburden 1/20; using Sorbact. She has severe venous hypertension status post stent placement for pelvic vein compression. We applied gentamicin last time to see if we could reduce bioburden I had some discussion with her today about the use of pentoxifylline. This is occasionally used in this setting for wounds with refractory venous insufficiency. However this interacts with Plavix. She tells me that she was put on this after stent placement for 3 months. She will call Dr. Claretha Cooper office to discuss 1/27; we are using gentamicin under Sorbact. 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 She has a stent. Wound medially is measuring smaller this  week. Laterally measuring slightly larger although she has some satellite lesions superiorly 2/3; gentamicin under Sorbact under 4-layer compression. 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 She has a stent on Plavix. Her wounds are measuring smaller this week. More substantially laterally where there is a satellite lesion superiorly. 2/10; gentamicin under Sorbac. 4-layer compression. Patient communicated with Dr. Donzetta Matters at vein and vascular in [place]. He is okay with the patient coming off Plavix I will therefore start her on pentoxifylline for a 1 month trial. In general her wounds look better today. I had some concerns about swelling in the left thigh however she measures 61.5 on the right and 63 on the mid thigh which does not suggest there is any difficulty. The patient is not describing any pain. 2/17; gentamicin under Sorbac 4-layer compression. She has been on pentoxifylline for 1 week and complains of loose stool. No nausea she is eating and drinking well 2/24; the  patient apparently came in 2 days ago for a nurse visit when her wrap fell down. Both areas look a little worse this week macerated medially and satellite lesions laterally. Change to silver alginate today 3/3; wounds are larger today especially medially. She also has more swelling in her foot lower leg and I even noted some swelling in her posterior thigh which is tender. I wonder about the patency of her stent. Fortuitously she sees Dr. Claretha Cooper group on Friday 3/10; Mrs. Darnold was seen by vein and vascular on 3/5. The patient underwent ultrasound. There was no evidence of thrombosis involving the IVC no evidence of thrombosis involving the right common iliac vein there is no evidence of thrombosis involving the right external iliac vein the left external vein is also patent. The right common iliac vein stent appears patent bilateral common femoral veins are compressible and appear patent. I was concerned about the left common iliac stent however it looks like this is functional. She has some edema in the posterior thigh that was tender she still has that this week. I also note they had trouble finding the pulses in her left foot and booked her for an ABI baseline in 4 weeks. She will follow up in 6 months for repeat IVC duplex. The patient stopped the pentoxifylline because of diarrhea. It does not look like that was being effective in any case. I have advised her to go back on her aspirin 81 mg tablet, vascular it also suggested this 3/17; comes in today with her wound surfaces a lot better. The excoriations from last week considerably better probably secondary to the TCA. We have been using silver alginate 3/24; comes in today with smaller wounds both medially and laterally. Both required debridement. There are 2 small satellite areas superiorly laterally. She also has a very odd bandlike area in the mid calf almost looking like there was a weakness in the wrap in a localized area. I would write  this off as being this however anteriorly she has a small raised ballotable area that is very tender almost reminiscent of an abscess but there was no [name], [name]. ([name]) obvious purulent surface to it. 02/04/20 upon evaluation today patient appears to be doing fairly well in regard to her wounds today. Fortunately there is no signs of active infection at this time. No fevers, chills, nausea, vomiting, or diarrhea. She has been tolerating the dressing changes without complication. Fortunately I feel like she is showing signs of improvement although has been sometime since have seen her. Nonetheless  the area of concern that Dr. Dellia Nims had last week where she had possibly an area of the wrap that was we can allow the leg to bulge appears to be doing significantly better today there is no signs of anything worsening. 4/7; the patient's wounds on her medial and lateral left leg continue to contract. We have been using a regular alginate. Last week she developed an area on the right medial lower leg which is probably a venous ulcer as well. 4/14; the wounds on her left medial and lateral lower leg continue to contract. Surface eschar. We have been using regular alginate. The area on the right medial lower leg is closed. We have been putting both legs under 4-layer contraction. The patient went back to see vein and vascular she had arterial studies done which were apparently "quite good" per the patient although I have not read their notes I have never felt she had an arterial issue. 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 This is been longstanding and refractory to exercise, leg elevation and longstanding use of compression wraps in our clinic as well as compression stockings on the times we have been able to get these to heal 4/21; we thought she actually might be close this week however she arrives in clinic with a lot of edema in her upper left  calf and into her posterior thigh. This is been an intermittent problem here. She says the wrap fell down but it was replaced with a nurse visit on Monday. We are using calcium alginate to the wounds and the wound sizes there not terribly larger than last week but there is a lot more edema 4/28; again wound edges are smaller on both sides. Her edema is better controlled than last time. She is obtained her compression pumps from [employer] although they have not been to her home to set these up. 5/5; left medial and left lateral both look stable. I am not sure the medial is any smaller. We have been using calcium alginate under 4-layer compression. She had an area on the right medial. This was eschared today. We have been wrapping this as well. She does not tolerate external compression stockings due to a history of various contact allergies. She has her compression pumps however the representative from the company is coming on her to show her how to use these tomorrow 5/19; patient with severe chronic venous insufficiency secondary to central venous disease. She had a stent placed in her left common iliac vein. She has done better since but still difficult to control wounds. She comes in today with nothing open on the right leg. Her areas on the left medial and left lateral are just about closed. We are using calcium alginate under 4-layer compression. She is using her external compression pumps at home She only has 15-20 support stockings. States she cannot get anything tighter than that on. 03/30/20-Patient returns at 1 week, the wounds on the left leg are both slightly bigger, the last week she was on 3 layer compression which started to slide down. She is starting to use her lymphedema pumps although she stated on 1 day her right ankle started to swell up and she have to stop that day. Unfortunately the open area seem to oscillate between improving to the point of healing and then flaring  up all to do with effectiveness of compression or lack of due to the left leg topography not keeping the compression wraps from rolling  down 6/2 patient comes in with a 15/20 mmHg stocking on the right leg. She tells me that she developed a lot of swelling in her ankles she saw orthopedics she was felt to possibly be having a flare of pseudogout versus some other type of arthritis. She was put on steroids for a respiratory issue so that helps with the inflammation. She has not been using the pumps all week. She thinks the left thigh is more swollen than usual and I would agree with that. She has an appointment with Dr. Donzetta Matters 9 days or so from now 6/9; both wounds on the left medial and left lateral are smaller. We have been using calcium alginate under compression. She does not have an open wound on the right leg she is using a stocking and her compression pumps things are going well. She has an appointment with Dr. Donzetta Matters with regards to her stent in the left common iliac vein 6/16; the wounds on the left medial and left lateral ankle continues to contract. The patient saw Dr. Donzetta Matters and I think he seems satisfied. Ordered follow-up venous reflux studies on both sides in September. Cautioned that she may need thigh-high stockings. She has been using calcium alginate under compression on the left and her own stocking on the right leg. She tells [place] there are no open wounds on the right 6/23; left lateral is just about closed. Medial required debridement today. We have been using calcium alginate. Extensive discussion about the compression pumps she is only using these on 25 mmHg states she could not take 40 or 30 when the wrap came out to her home to demonstrate these. He said they should not feel tight 6/30; the left lateral wound has a slight amount of eschar. . The area medially is about the same using Hydrofera Blue. 7/7; left lateral wound still has some eschar. I will remove this next week may be  closed. The area medially is very small using Hydrofera Blue with improvement. Unfortunately the stockings fell down. Unfortunately the blisters have developed at the edge of where the wrap fell. When this happened she says her legs hurt she did not use her pumps. We are not open Monday for her to come in and change the wraps and she had an appointment yesterday. She also tells me that she is going to have an MRI of her back. She is having pain radiating into her left anterior leg she thinks her from an L5 disc. She saw Dr. Ellene Route of neurosurgery 7/14; the area on the left lateral ankle area is closed. Still a small area medially however it looks better as well. We have been using Hydrofera Blue under 4-layer compression 7/21; left lateral ankle is still closed however her wound on the medial left calf is actually larger. This is probably because Hydrofera Blue got stuck to the wound. She came in for a nurse change on Friday and will do that again this week I was concerned about the amount of swelling that she had last week however she is using her compression pumps twice a day and the swelling seems well controlled 7/28; remaining wound on the left medial lower leg is smaller. We have been using moistened silver collagen under compression she is coming back for a nurse visit. For reasons that were not really clear she was just keeping her legs elevated and not using her compression pumps. I have asked her to use the compression pumps. She does not have any wounds  on the right leg 06/15/20-Patient returns at 2 weeks, her LLE edema is worse and she developed a blister wound that is new and has bigger posterior calf wound on right, we are using Prisma with pad, 4 layer compression. she has been on lasix 40 mg daily 8/18; patient arrives today with things a lot worse than I remember from a few weeks ago. She was seen last week. Noted that her edema was worse and that she now had a left lateral wound as  well as deteriorating edema in the medial and posterior part of the lower leg. She says she is using his or her external compression pumps once a day although I wonder about the compliance. 8/25; weeping area on the right medial lower leg. This had actually gotten a small localized area of her compression stocking wet. On the left side there is a large denuded area on the posterior medial lower leg and smaller area on the lateral. This was not the original areas that we dealt with. 9/1 the patient's wound on the left leg include the left lateral and left posterior. Larger superficial wounds weeping. She has very poor edema control. Tender localized edema in the left lower medial ankle/heel probably because of localized wrap issues. She freely admits she is not using [place], [place] ([name]) the compression pumps. She has been up on her feet a lot. She thinks the hydrofera blue is contributing to the pain she is experiencing.. This is a complaint that I have occasionally heard 9/8; really not much improvement. The patient is still complaining of a lot of pain particularly when she uses compression pumps. I switched her to silver alginate last time because she found the Hydrofera Blue to be irritating. I don't hear much difference in her description with the silver alginate. 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 She has follow-up with Dr. Donzetta Matters I think for ultrasound next month 9/15; quite a bit of improvement today. We have less edema and more epithelialization in both of her wound areas on the left medial and left lateral calf. These are not the site of her original wounds in this area. She says she has been using her compression pumps for 30 minutes twice a day, there pain issues that never quite understood. Silver alginate as the primary dressing 9/22; continued improvement. Both areas medially and laterally  still have a small open area there is some eschar. She continues to complain of left medial ankle pain. Swelling in the leg is in much better condition. We have been using silver alginate 9/29; continued improvement. Both areas medially and laterally in the left calf look as though they are close some minor surface eschar but I think this is epithelialized. She comes in today saying she has a ruptured disc at L4-L5 cannot bend over to put on her stockings. 10/6; patient comes in today with no open wounds on either leg. However her edema on the left leg in the upper one third of the lower leg is poorly controlled nonpitting. She says that she could not use the pumps for 2 days and then she has been using the last couple of days. It is not clear to me she has been able to get her stocking on. She has back problems. Mrs. Barca has severe chronic venous insufficiency with secondary lymphedema. Her venous insufficiency is partially centrally mediated and that she is now  post stent in the left common iliac vein. [facility] Thurner's syndrome/physiology]. She follows up with them on 10/15. She wears 20/30 below-knee stockings. She is supposed to use compression pumps at home although I think her compliance about with this is been less than 100%. I have asked her to use these 3 times a day. Finally I think she has lipodermatosclerosis in the left lower leg with an inverted bottle sign. It is been a major problem controlling the edema in the left leg. The right leg we have had wounds on but not as significant a problem is on the left READMISSION 04/12/2021 Mrs. Stoffers is a 75 year old woman we know well in this clinic. She has severe chronic venous insufficiency. 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 I believe she has had bilateral greater saphenous vein ablation in the past as well. She tells me that this wound opened sometime in March. She had a fall and thinks it was  initially abrasion. She developed areas she describes as little blisters on the anterior part of her leg and she saw dermatology and was treated for methicillin staph aureus with several rounds of antibiotics. She has been using support stockings on the left leg and says this is the only thing she can get on. Her compression pump use maybe once a day she says if she did not use one she use the other. She comes in today with incredible swelling in the left leg with a wound on the left posterior calf. She has been using Neosporin to this previously a hydrocolloid. 6/15; patient arrives back for 1 week follow-up.Marland Kitchen Apparently her wrap fell down she did not call us to replace this. He has poor edema control. She only uses her compression pumps once a day 6/29; patient presents for 1 week follow-up. She has tolerated the compression wrap well. We have been using Iodoflex under the wrap. She has no issues or complaints today. 7/6; patient presents for 1 week follow-up. She states that the compression wrap rolled down her leg 4 days ago. She has been trying to keep the area covered but has no dressings at home to use. She denies signs of infection. 7/13; patient presents for 1 week follow-up. She states that her compression wrap rolled down her right leg and she called our office and had it placed a few days ago. She has been tolerating the current wrap well. She states that the Iodoflex is causing a burning sensation. She denies signs of infection. 7/27; patient presents for 1 week follow-up She has tolerated the compression wrap well with Hydrofera Blue underneath. She has now developed a wound to her right lower extremity. She reports having a culture done To this area by her dermatologist that she reports is negative. She currently denies signs of infection. 8/30; patient presents for 1 week follow-up. On the left side she has tolerated the compression wrap well with Hydrofera Blue underneath.  She reports some discomfort to her right lower extremity with the 3 layer compression. She currently denies signs of infection. 06/14/2021 upon evaluation today patient's wound actually appears to be doing decently well based on what I am seeing currently. She actually has 2 areas 1 on the left distal/posterior lower leg and the other on the right lower leg. Subsequently the measurements are roughly the same may be slightly smaller but in general have not made any trend towards getting worse which is great news. Objective Constitutional [name]  and well-hydrated in no acute distress. Vitals Time Taken: 2:05 PM, Height: 63 in, Weight: 212 lbs, BMI: 37.6, Temperature: 98.5 F, Pulse: 62 bpm, Respiratory Rate: 16 breaths/min, Blood Pressure: 154/82 mmHg. Respiratory normal breathing without difficulty. MINDIE, HERLONG ([name]) Psychiatric this patient is able to make decisions and demonstrates good insight into disease process. Alert and Oriented x 3. pleasant and cooperative. General Notes: Upon inspection patient's wound bed actually showed signs of good granulation epithelization at this point. There does not appear to be any signs of active infection which is great news and overall extremely pleased with where things stand today. No fevers, chills, nausea, vomiting, or diarrhea. Integumentary (Hair, Skin) Wound #13 status is Open. Original cause of wound was Gradually Appeared. The date acquired was: 02/24/2021. The wound has been in treatment 9 weeks. The wound is located on the Left,Distal,Posterior Lower Leg. The wound measures 1.7cm length x 1.5cm width x 0.1cm depth; 2.003cm^2 area and 0.2cm^3 volume. There is Fat Layer (Subcutaneous Tissue) exposed. There is no tunneling or undermining noted. There is a medium amount of serosanguineous drainage noted. There is medium (34-66%) pink granulation within the wound bed. There is a medium (34- 66%) amount of necrotic tissue within  the wound bed including Adherent Slough. Wound #14 status is Open. Original cause of wound was Gradually Appeared. The date acquired was: 05/05/2021. The wound has been in treatment 2 weeks. The wound is located on the Right Lower Leg. The wound measures 4cm length x 4cm width x 0.1cm depth; 12.566cm^2 area and 1.257cm^3 volume. There is Fat Layer (Subcutaneous Tissue) exposed. There is no tunneling or undermining noted. There is a medium amount of serosanguineous drainage noted. There is medium (34-66%) red, pink granulation within the wound bed. There is a medium (34-66%) amount of necrotic tissue within the wound bed including Adherent Slough. Assessment Active Problems ICD-10 Chronic venous hypertension (idiopathic) with ulcer and inflammation of left lower extremity Lymphedema, not elsewhere classified Non-pressure chronic ulcer of left calf limited to breakdown of skin Unspecified open wound, right lower leg, initial encounter Procedures Wound #13 Pre-procedure diagnosis of Wound #13 is a Venous Leg Ulcer located on the Left,Distal,Posterior Lower Leg . There was a Four Layer Compression Therapy Procedure by Donnamarie Poag, RN. Post procedure Diagnosis Wound #13: Same as Pre-Procedure Wound #14 Pre-procedure diagnosis of Wound #14 is a Venous Leg Ulcer located on the Right Lower Leg . There was a Three Layer Compression Therapy Procedure by Donnamarie Poag, RN. Post procedure Diagnosis Wound #14: Same as Pre-Procedure Plan Follow-up Appointments: Wound #13 Left,Distal,Posterior Lower Leg: Return Appointment in 1 week. Nurse Visit as needed Bathing/ Shower/ Hygiene: May shower with wound dressing protected with water repellent cover or cast protector. Anesthetic (Use 'Patient Medications' Section for Anesthetic Order Entry): Lidocaine applied to wound bed Edema Control - Lymphedema / Segmental Compressive Device / Other: Elevate, Exercise Daily and Avoid Standing for Long Periods of  Time. Elevate legs to the level of the heart and pump ankles as often as possible Elevate leg(s) parallel to the floor when sitting. Compression Pump: Use compression pump on left lower extremity for 60 minutes, twice daily. - TWICE DAILY for 1 hour Compression Pump: Use compression pump on right lower extremity for 60 minutes, twice daily. - TWICE DAILY for 1 hour WOUND #13: - Lower Leg Wound Laterality: Left, Posterior, Distal Cleanser: Soap and Water 1 x Per Week/30 Days ESBEIDY, PIGG ([name]) Discharge Instructions: Gently cleanse wound with antibacterial soap, rinse and  pat dry prior to dressing wounds Topical: Triamcinolone Acetonide Cream, 0.1%, 15 (g) tube 1 x Per Week/30 Days Discharge Instructions: Peri wound Apply as directed by provider. Primary Dressing: AquacelAg Advantage Dressing, 4x5 (in/in) 1 x Per Week/30 Days Discharge Instructions: Apply to wound as directed Secondary Dressing: ABD Pad 5x9 (in/in) 1 x Per Week/30 Days Discharge Instructions: Cover with ABD pad Compression Wrap: Medichoice 4 layer Compression System, 35-40 mmHG 1 x Per Week/30 Days Discharge Instructions: Apply multi-layer wrap as directed. WOUND #14: - Lower Leg Wound Laterality: Right Cleanser: Soap and Water 1 x Per Week/30 Days Discharge Instructions: Gently cleanse wound with antibacterial soap, rinse and pat dry prior to dressing wounds Topical: Triamcinolone Acetonide Cream, 0.1%, 15 (g) tube 1 x Per Week/30 Days Discharge Instructions: Peri wound Apply as directed by provider. Primary Dressing: AquacelAg Advantage Dressing, 4x5 (in/in) 1 x Per Week/30 Days Discharge Instructions: Apply to wound as directed Secondary Dressing: ABD Pad 5x9 (in/in) 1 x Per Week/30 Days Discharge Instructions: Cover with ABD pad Compression Wrap: Profore Lite LF 3 Multilayer Compression Bandaging System 1 x Per Week/30 Days Discharge Instructions: Apply 3 multi-layer wrap as prescribed. 1. I am good  recommend that we go ahead and continue with the alginate dressings I think that still a good way to go here. We will however get rid of the silver and just use the plain alginate as she believes in the past she has had trouble with irritation from the silver. 2. I am also can recommend that we have the patient going to continue with the 3 layer compression wrap. 3. I am also can recommend the patient should continue to elevate her legs much as possible to try to help with edema control I think this is still of utmost importance. 4. With regard to the Apligraf she states in the past this has not worked and she really does not want to go down that road again for that reason were not can order anything at this point per patient request. We will see patient back for reevaluation in 1 week here in the clinic. If anything worsens or changes patient will contact our office for additional recommendations. Electronic Signature(s) Signed: 06/15/2021 5:31:12 PM By: Worthy Keeler PA-C Entered By: Worthy Keeler on 06/15/2021 17:31:12 [place], [name] ([name]) -------------------------------------------------------------------------------- SuperBill Details Patient Name: ALLEANE, GLUCK. Date of Service: 06/14/2021 Medical Record Number: [name] Patient Account Number: [id number] Date of Birth/Sex: 1946/08/11 (75 y.o. F) Treating RN: Donnamarie Poag Primary Care Provider: [name] Other Clinician: Referring Provider: [name] Treating Provider/Extender: [name] in Treatment: 9 Diagnosis Coding ICD-10 Codes Code Description [phone] Chronic venous hypertension (idiopathic) with ulcer and inflammation of left lower extremity I89.0 Lymphedema, not elsewhere classified L97.221 Non-pressure chronic ulcer of left calf limited to breakdown of skin S81.801A Unspecified open wound, right lower leg, initial encounter Facility Procedures CPT4: Description Modifier Quantity Code  [id number] BILATERAL: Application of multi-layer venous compression system; leg (below knee), including 1 ankle and foot. Physician Procedures CPT4 Code Description: [id number] - WC PHYS LEVEL 4 - EST PT Modifier: Quantity: 1 CPT4 Code Description: ICD-10 Diagnosis Description I87.332 Chronic venous hypertension (idiopathic) with ulcer and inflammation of I89.0 Lymphedema, not elsewhere classified L97.221 Non-pressure chronic ulcer of left calf limited to breakdown of skin  S81.801A Unspecified open wound, right lower leg, initial encounter Modifier: left lower extremity Quantity: Electronic Signature(s) Signed: 06/15/2021 5:31:46 PM By: Worthy Keeler PA-C Previous Signature: 06/14/2021 4:15:45 PM Version  By: Donnamarie Poag Entered By: Worthy Keeler on 06/15/2021 17:31:46

## 2021-06-16 ENCOUNTER — Other Ambulatory Visit: Payer: Self-pay

## 2021-06-16 DIAGNOSIS — L97821 Non-pressure chronic ulcer of other part of left lower leg limited to breakdown of skin: Secondary | ICD-10-CM | POA: Diagnosis not present

## 2021-06-16 NOTE — Progress Notes (Signed)
PERSEPHANY, BATZER ([id number]) Visit Report for 06/16/2021 Physician Orders Details Patient Name: [name], [name]. Date of Service: 06/16/2021 2:45 PM Medical Record Number: [id number] Patient Account Number: [id number] Date of Birth/Sex: February 14, 1946 (75 y.o. F) Treating RN: Cornell Barman Primary Care Provider: [name] Other Clinician: Referring Provider: [name] Treating Provider/Extender: [name] in Treatment: 9 Verbal / Phone Orders: No Diagnosis Coding Follow-up Appointments Wound #13 Left,Distal,Posterior Lower Leg o Return Appointment in 1 week. o Nurse Visit as needed Bathing/ Shower/ Hygiene o May shower with wound dressing protected with water repellent cover or cast protector. Anesthetic (Use 'Patient Medications' Section for Anesthetic Order Entry) o Lidocaine applied to wound bed Edema Control - Lymphedema / Segmental Compressive Device / Other o Elevate, Exercise Daily and Avoid Standing for Long Periods of Time. o Elevate legs to the level of the heart and pump ankles as often as possible o Elevate leg(s) parallel to the floor when sitting. o Compression Pump: Use compression pump on left lower extremity for 60 minutes, twice daily. - TWICE DAILY for 1 hour o Compression Pump: Use compression pump on right lower extremity for 60 minutes, twice daily. - TWICE DAILY for 1 hour Wound Treatment Wound #13 - Lower Leg Wound Laterality: Left, Posterior, Distal Cleanser: Soap and Water 1 x Per Week/30 Days Discharge Instructions: Gently cleanse wound with antibacterial soap, rinse and pat dry prior to dressing wounds Topical: Triamcinolone Acetonide Cream, 0.1%, 15 (g) tube 1 x Per Week/30 Days Discharge Instructions: Peri wound Apply as directed by provider. Primary Dressing: AquacelAg Advantage Dressing, 4x5 (in/in) 1 x Per Week/30 Days Discharge Instructions: Apply to wound as directed Secondary Dressing: ABD Pad 5x9 (in/in) 1 x Per  Week/30 Days Discharge Instructions: Cover with ABD pad Compression Wrap: Medichoice 4 layer Compression System, 35-40 mmHG 1 x Per Week/30 Days Discharge Instructions: Apply multi-layer wrap as directed. Wound #14 - Lower Leg Wound Laterality: Right Cleanser: Soap and Water 1 x Per Week/30 Days Discharge Instructions: Gently cleanse wound with antibacterial soap, rinse and pat dry prior to dressing wounds Topical: Triamcinolone Acetonide Cream, 0.1%, 15 (g) tube 1 x Per Week/30 Days Discharge Instructions: Peri wound Apply as directed by provider. Secondary Dressing: ABD Pad 5x9 (in/in) 1 x Per Week/30 Days Discharge Instructions: Cover with ABD pad Secondary Dressing: Non-Adherent Pad 3x8 (in/in) 1 x Per Week/30 Days Compression Wrap: Profore Lite LF 3 Multilayer Compression Bandaging System 1 x Per Week/30 Days Discharge Instructions: Apply 3 multi-layer wrap as prescribed. [name], [name] Electronic Signature(s) Signed: 06/16/2021 5:54:54 PM By: Worthy Keeler PA-C Signed: 06/16/2021 6:04:50 PM By: [name], [name], RN, CWS, [name], [name] Entered By: [name], [name], RN, CWS, Kim on 06/16/2021 15:18:53 [name], [name] -------------------------------------------------------------------------------- SuperBill Details Patient Name: [name], [name]. Date of Service: 06/16/2021 Medical Record Number: [id number] Patient Account Number: [id number] Date of Birth/Sex: [birth year] (75 y.o. F) Treating RN: Cornell Barman Primary Care Provider: [name] Other Clinician: Referring Provider: [name] Treating Provider/Extender: [name] in Treatment: 9 Diagnosis Coding ICD-10 Codes Code Description [phone] Chronic venous hypertension (idiopathic) with ulcer and inflammation of left lower extremity I89.0 Lymphedema, not elsewhere classified L97.221 Non-pressure chronic ulcer of left calf limited to breakdown of skin S81.801A Unspecified open wound, right lower  leg, initial encounter Facility Procedures CPT4 Code: [id number] Description: (Facility Use Only) [phone] - [place] LWR RT LEG Modifier: Quantity: 1 Electronic Signature(s) Signed: 06/16/2021 5:54:54 PM By: Worthy Keeler PA-C Signed: 06/16/2021 6:04:50  PM By: [name], [name], RN, CWS, [name], [name] Entered By: [name], [name], RN, CWS, Kim on 06/16/2021 15:21:28

## 2021-06-16 NOTE — Progress Notes (Signed)
AYLETH, STELZNER ([id number]) Visit Report for 06/16/2021 Arrival Information Details Patient Name: [name], [name]. Date of Service: 06/16/2021 2:45 PM Medical Record Number: [id number] Patient Account Number: [id number] Date of Birth/Sex: 08/03/1946 (75 y.o. F) Treating RN: Cornell Barman Primary Care Ameliarose Shark: [name] Other Clinician: Referring Jamail Cullers: [name] Treating Zadin Lange/Extender: [name] in Treatment: 9 Visit Information History Since Last Visit Has Dressing in Place as Prescribed: Yes Patient Arrived: Ambulatory Has Compression in Place as Prescribed: Yes Arrival Time: 14:54 Pain Present Now: No Accompanied By: self Transfer Assistance: None Patient Identification Verified: Yes Secondary Verification Process Completed: Yes Patient Requires Transmission-Based No Precautions: Patient Has Alerts: Yes Patient Alerts: Patient on Blood Thinner ASPIRIN NOT diabetic Electronic Signature(s) Signed: 06/16/2021 6:04:50 PM By: [name], [name], RN, CWS, [name], [name] Entered By: [name], [name], RN, CWS, Kim on 06/16/2021 15:19:11 [place], [name] ([id number]) -------------------------------------------------------------------------------- Compression Therapy Details Patient Name: [name], [name]. Date of Service: 06/16/2021 2:45 PM Medical Record Number: [id number] Patient Account Number: [id number] Date of Birth/Sex: [birth year] (75 y.o. F) Treating RN: Cornell Barman Primary Care Charnice Zwilling: [name] Other Clinician: Referring Aniella Wandrey: [name] Treating Weber Monnier/Extender: [name] in Treatment: 9 Compression Therapy Performed for Wound Assessment: Wound #14 Right Lower Leg Performed By: Clinician Cornell Barman, RN Compression Type: Three Hydrologist) Signed: 06/16/2021 6:04:50 PM By: [name], [name], RN, CWS, [name], [name] Entered By: [name], [name], RN, CWS, Kim on 06/16/2021 15:20:35 [name], [name]  ([id number]) -------------------------------------------------------------------------------- Encounter Discharge Information Details Patient Name: [name], [name]. Date of Service: 06/16/2021 2:45 PM Medical Record Number: [id number] Patient Account Number: [id number] Date of Birth/Sex: 02-10-1946 (75 y.o. F) Treating RN: Cornell Barman Primary Care Haruka Kowaleski: [name] Other Clinician: Referring Ryker Pherigo: [name] Treating Treyana Sturgell/Extender: [name] in Treatment: 9 Encounter Discharge Information Items Discharge Condition: Stable Ambulatory Status: Ambulatory Discharge Destination: Home Transportation: Private Auto Accompanied By: self Schedule Follow-up Appointment: Yes Clinical Summary of Care: Electronic Signature(s) Signed: 06/16/2021 6:04:50 PM By: [name], [name], RN, CWS, [name], [name] Entered By: [name], [name], RN, CWS, Kim on 06/16/2021 15:21:14 [name], [name] -------------------------------------------------------------------------------- Wound Assessment Details Patient Name: [name], [name]. Date of Service: 06/16/2021 2:45 PM Medical Record Number: [id number] Patient Account Number: [id number] Date of Birth/Sex: 24-Oct-1946 (74 y.o. F) Treating RN: Cornell Barman Primary Care Alexis Reber: [name] Other Clinician: Referring Colyn Miron: [name] Treating Deatra Mcmahen/Extender: Jeri Cos Weeks in Treatment: 9 Wound Status Wound Number: 14 Primary Venous Leg Ulcer Etiology: Wound Location: Right Lower Leg Wound Open Wounding Event: Gradually Appeared Status: Date Acquired: 05/05/2021 Comorbid Cataracts, Asthma, Sleep Apnea, Deep Vein Thrombosis, Weeks Of Treatment: 2 History: Hypertension, Peripheral Venous Disease, Osteoarthritis, Clustered Wound: No Received Chemotherapy, Received Radiation Wound Measurements Length: (cm) 3 Width: (cm) 3 Depth: (cm) 0.1 Area: (cm) 7.069 Volume: (cm) 0.707 % Reduction in Area: 63.6% % Reduction  in Volume: 63.6% Epithelialization: Medium (34-66%) Tunneling: No Undermining: No Wound Description Classification: Partial Thickness Exudate Amount: Medium Exudate Type: Serosanguineous Exudate Color: red, brown Foul Odor After Cleansing: No Slough/Fibrino Yes Wound Bed Granulation Amount: Large (67-100%) Exposed Structure Granulation Quality: Red, Pink Fascia Exposed: No Necrotic Amount: None Present (0%) Fat Layer (Subcutaneous Tissue) Exposed: No Tendon Exposed: No Muscle Exposed: No Joint Exposed: No Bone Exposed: No Limited to Skin Breakdown Treatment Notes Wound #14 (Lower Leg) Wound Laterality: Right Cleanser Soap and Water Discharge Instruction: Gently cleanse wound with antibacterial soap, rinse and pat dry prior to dressing wounds Peri-Wound Care Topical Triamcinolone Acetonide Cream,  0.1%, 15 (g) tube Discharge Instruction: Peri wound Apply as directed by Kendle Erker. Primary Dressing Secondary Dressing ABD Pad 5x9 (in/in) Discharge Instruction: Cover with ABD pad Non-Adherent Pad 3x8 (in/in) Secured With Compression Wrap [name], [name] ([id number]) Profore Lite LF 3 Multilayer Compression Bandaging System Discharge Instruction: Apply 3 multi-layer wrap as prescribed. Compression Stockings Environmental education officer) Signed: 06/16/2021 6:04:50 PM By: [name], [name], RN, CWS, [name], [name] Entered By: [name], [name], RN, CWS, Kim on 06/16/2021 15:20:15

## 2021-06-21 ENCOUNTER — Encounter: Payer: Medicare Other | Admitting: Internal Medicine

## 2021-06-21 ENCOUNTER — Telehealth: Payer: Self-pay | Admitting: Family Medicine

## 2021-06-21 MED ORDER — TRAMADOL HCL 50 MG PO TABS: 50.0000 mg | ORAL_TABLET | Freq: Two times a day (BID) | ORAL | 0 refills | Status: DC | PRN

## 2021-06-21 NOTE — Telephone Encounter (Signed)
Spoke with pt relaying Dr. G's message. Pt verbalizes understanding.  

## 2021-06-21 NOTE — Addendum Note (Signed)
Addended by: [name] on: 06/21/2021 01:57 PM   Modules accepted: Orders

## 2021-06-21 NOTE — Telephone Encounter (Signed)
Patient call in stated her sciatica pain has started back wants to know if she should continue to take traMADol (ULTRAM) 50 MG table . Please advise . Patient would like a call back.

## 2021-06-21 NOTE — Telephone Encounter (Signed)
She can continue tramadol if it's helping. I have refilled.  Let us know if not doing well with this.

## 2021-06-22 ENCOUNTER — Telehealth: Payer: Self-pay | Admitting: Family Medicine

## 2021-06-22 NOTE — Telephone Encounter (Addendum)
Is she having leg weakness? If so recommend urgent eval. May schedule for appointment in the afternoon at open slot.  Don't recommend prednisone or oxycodone at this time.  I have scheduled her at 3pm for tomorrow - plz ensure she can come then.

## 2021-06-22 NOTE — Telephone Encounter (Signed)
Mrs. Oakden called in and wanted to give a report on the pain, the pain hasnt gotten any worse, and the pain is still in her leg the right leg feels very heavy and its hard to move around and she is using her cane in the house, and lower back pain for the past couple days. And she is on tramadol , and she still has Prednisone and Oxycodone left from the ER visit.

## 2021-06-22 NOTE — Telephone Encounter (Signed)
Pt sent for triage access nurse for sxs listed.  FYI to Dr. Darnell Level.

## 2021-06-23 ENCOUNTER — Other Ambulatory Visit: Payer: Self-pay

## 2021-06-23 ENCOUNTER — Encounter: Payer: Self-pay | Admitting: Family Medicine

## 2021-06-23 ENCOUNTER — Encounter: Payer: Medicare Other | Admitting: Physician Assistant

## 2021-06-23 ENCOUNTER — Ambulatory Visit (INDEPENDENT_AMBULATORY_CARE_PROVIDER_SITE_OTHER): Payer: Medicare Other | Admitting: Family Medicine

## 2021-06-23 VITALS — BP 148/84 | HR 81 | Temp 97.9°F | Ht 63.0 in | Wt 205.2 lb

## 2021-06-23 DIAGNOSIS — L97821 Non-pressure chronic ulcer of other part of left lower leg limited to breakdown of skin: Secondary | ICD-10-CM | POA: Diagnosis not present

## 2021-06-23 DIAGNOSIS — M5431 Sciatica, right side: Secondary | ICD-10-CM

## 2021-06-23 DIAGNOSIS — L97222 Non-pressure chronic ulcer of left calf with fat layer exposed: Secondary | ICD-10-CM | POA: Diagnosis not present

## 2021-06-23 DIAGNOSIS — I872 Venous insufficiency (chronic) (peripheral): Secondary | ICD-10-CM | POA: Diagnosis not present

## 2021-06-23 MED ORDER — KETOROLAC TROMETHAMINE 30 MG/ML IJ SOLN
30.0000 mg | Freq: Once | INTRAMUSCULAR | Status: AC
  Administered 2021-06-23: 30 mg via INTRAMUSCULAR

## 2021-06-23 NOTE — Progress Notes (Signed)
Patient ID: [name], female    DOB: 06/07/1946, 75 y.o.   MRN: [id number]  This visit was conducted in person.  BP (!) 148/84   Pulse 81   Temp 97.9 F (36.6 C) (Temporal)   Ht '5\' 3"'$  (1.6 m)   Wt 205 lb 4 oz (93.1 kg)   SpO2 96%   BMI 36.36 kg/m    CC: ongoing R sciatica Subjective:   HPI: [name] is a 75 y.o. female presenting on 06/23/2021 for Sciatica (C/o ongoing right side sciatica pain. )   See prior note for details.  Ongoing R sciatica pain s/p ER evaluation 06/05/2021 treated with toradol IM, discharged with prednisone and oxycodone but then had difficulty tolerating prednisone. She also continues tylenol '1000mg'$  bid. Symptoms did improve for 1+ week after last visit. Symptoms recurred over this past weekend so she restarted tramadol Q8 hours with temporary benefit. Notes ongoing leg heaviness worse with prolonged standing. She feels toradol shot is what has helped the most. Notes ongoing R buttock ache/throb with radiation to R lower back.   No leg weakness, no saddle anesthesia, fevers/chills, leg numbness.  She feels symptoms are slowly improving.   Known lumbar lumbar DDD with moderate spinal stenosis L3/4 Prior to this month, last sciatica flare was 12/2020.   Has seen neurosurgery (Elsner) - both agreed surgery not indicated - not bad enough. Saw wound clinic earlier today with leg wraps.   Lumbar MRI 06/2020 IMPRESSION: 1. Disc degeneration at L2-3 and below without progression from 2019. 2. L3-4 moderate spinal stenosis. 3. L4-5 and L5-S1 asymmetric right subarticular recess narrowing     Relevant past medical, surgical, family and social history reviewed and updated as indicated. Interim medical history since our last visit reviewed. Allergies and medications reviewed and updated. Outpatient Medications Prior to Visit  Medication Sig Dispense Refill   acetaminophen (TYLENOL) 650 MG CR tablet Take 1,300 mg by mouth every 8 (eight) hours as needed  for pain.      albuterol (VENTOLIN HFA) 108 (90 Base) MCG/ACT inhaler INHALE 2 PUFFS BY MOUTH EVERY 6 HORUS AS NEEDED FOR WHEEZING/SHORTNESS OF BREATH 8.5 g 3   aspirin EC 81 MG tablet Take 81 mg by mouth at bedtime. Melanoma prevention     furosemide (LASIX) 40 MG tablet Take 1 tablet (40 mg total) by mouth 2 (two) times daily as needed for fluid. Take second dose if needed for leg swelling 60 tablet 6   KLOR-CON M20 20 MEQ tablet TAKE 1 TABLET BY MOUTH TWICE A DAY 60 tablet 5   loratadine (CLARITIN) 10 MG tablet Take 10 mg by mouth daily.     losartan (COZAAR) 25 MG tablet TAKE 2 TABLETS BY MOUTH EVERY DAY 60 tablet 5   meloxicam (MOBIC) 15 MG tablet TAKE 1 TABLET BY MOUTH EVERY DAY AS NEEDED FOR PAIN 30 tablet 1   montelukast (SINGULAIR) 10 MG tablet TAKE 1 TABLET BY MOUTH EVERY DAY 30 tablet 3   Multiple Vitamin (MULTIVITAMIN WITH MINERALS) TABS tablet Take 1 tablet by mouth daily.     Polyvinyl Alcohol-Povidone (TEARS PLUS OP) Place 1 drop into both eyes daily as needed (dry eyes/ redness/ burning).      PRESCRIPTION MEDICATION CPAP     Probiotic Product (PROBIOTIC DAILY PO) Take 1 tablet by mouth daily.      traMADol (ULTRAM) 50 MG tablet Take 1 tablet (50 mg total) by mouth every 12 (twelve) hours as needed for moderate  pain. 20 tablet 0   triamcinolone (NASACORT) 55 MCG/ACT AERO nasal inhaler Place 2 sprays into the nose daily. In each nostril     triamcinolone cream (KENALOG) 0.1 % Apply 1 application topically 2 (two) times daily. Apply to AA. Max 2 weeks at a time. 80 g 1   UNABLE TO FIND Take by mouth.     Vitamin D, Ergocalciferol, (DRISDOL) 1.25 MG (50000 UNIT) CAPS capsule TAKE 1 CAPSULE BY MOUTH EVERY 7 DAYS 12 capsule 3   predniSONE (DELTASONE) 20 MG tablet 2 daily x 3 days, then 1 daily x 3 days then 1/2 daily x 4 days     No facility-administered medications prior to visit.     Per HPI unless specifically indicated in ROS section below Review of Systems  Objective:  BP  (!) 148/84   Pulse 81   Temp 97.9 F (36.6 C) (Temporal)   Ht '5\' 3"'$  (1.6 m)   Wt 205 lb 4 oz (93.1 kg)   SpO2 96%   BMI 36.36 kg/m   Wt Readings from Last 3 Encounters:  06/23/21 205 lb 4 oz (93.1 kg)  06/09/21 214 lb 3 oz (97.2 kg)  05/26/21 211 lb 6.4 oz (95.9 kg)      Physical Exam Vitals and nursing note reviewed.  Constitutional:      Appearance: Normal appearance. She is not ill-appearing.  Musculoskeletal:        General: Normal range of motion.     Right lower leg: No edema.     Left lower leg: No edema.     Comments:  Discomfort overlying prior midline surgical site  Discomfort to palpation of R lumbar paraspinous mm  Neg seated SLR bilaterally. No pain with int/ext rotation at hip. No pain at SIJ, GTB or sciatic notch bilaterally.   Skin:    General: Skin is warm and dry.     Findings: No rash.  Neurological:     Mental Status: She is alert.  Psychiatric:        Mood and Affect: Mood normal.        Behavior: Behavior normal.      Results for orders placed or performed in visit on [id number]  Basic metabolic panel  Result Value Ref Range   Sodium 143 135 - 145 mEq/L   Potassium 4.1 3.5 - 5.1 mEq/L   Chloride 107 96 - 112 mEq/L   CO2 31 19 - 32 mEq/L   Glucose, Bld 96 70 - 99 mg/dL   BUN 15 6 - 23 mg/dL   Creatinine, Ser 0.67 0.40 - 1.20 mg/dL   GFR 86.03 >60.00 mL/min   Calcium 9.4 8.4 - 10.5 mg/dL  TSH  Result Value Ref Range   TSH 2.69 0.35 - 4.50 uIU/mL   *Note: Due to a large number of results and/or encounters for the requested time period, some results have not been displayed. A complete set of results can be found in Results Review.    Assessment & Plan:  This visit occurred during the SARS-CoV-2 public health emergency.  Safety protocols were in place, including screening questions prior to the visit, additional usage of staff PPE, and extensive cleaning of exam room while observing appropriate contact time as indicated for disinfecting  solutions.   Problem List Items Addressed This Visit     Right sided sciatica - Primary    Symptoms did significantly improve after last seen here, however over the past week symptoms have recurred. Discussed tramadol use,  oxycodone use, avoid prednisone use at this time. Will provide toradol IM '30mg'$  today. Discussed possible PT evaluation however want to be cautious with this with her cervical neck history.         Meds ordered this encounter  Medications   ketorolac (TORADOL) 30 MG/ML injection 30 mg   No orders of the defined types were placed in this encounter.   Patient Instructions  I think you have recurrent sciatica on the right side  Toradol shot today.  Continue tramadol as needed, may use oxycodone as needed for breakthrough pain, don't mix with tramadol.   Let us know if recurrent or worsening symptoms.  Consider physical therapy.   Follow up plan: Return if symptoms worsen or fail to improve.  [name], MD

## 2021-06-23 NOTE — Assessment & Plan Note (Signed)
Symptoms did significantly improve after last seen here, however over the past week symptoms have recurred. Discussed tramadol use, oxycodone use, avoid prednisone use at this time. Will provide toradol IM '30mg'$  today. Discussed possible PT evaluation however want to be cautious with this with her cervical neck history.

## 2021-06-23 NOTE — Progress Notes (Signed)
SHAWNTE, ROGEL ([id number]) Visit Report for 06/23/2021 Chief Complaint Document Details Patient Name: [name], [name]. Date of Service: 06/23/2021 1:45 PM Medical Record Number: [id number] Patient Account Number: [id number] Date of Birth/Sex: 07-Sep-1946 (75 y.o. F) Treating RN: Cornell Barman Primary Care Provider: [name] Other Clinician: Referring Provider: [name] Treating Provider/Extender: [name] in Treatment: 10 Information Obtained from: Patient Chief Complaint Left calf venous stasis ulceration. 11/13/16; the patient is here for a left calf recurrent ulceration which is painful and has been present for the last month 01/22/17; the patient re-presents here for recurrent difficulties with blistering and leaking fluid on her left posterior calf 12/10/18; the patient returns to clinic for a wound on the left medial calf 04/12/2021; left lower extremity wound, 05/31/21; right leg wound Electronic Signature(s) Signed: 06/23/2021 2:19:27 PM By: Worthy Keeler PA-C Entered By: Worthy Keeler on 06/23/2021 14:19:27 Crotteau, [name] -------------------------------------------------------------------------------- Debridement Details Patient Name: [name]. Date of Service: 06/23/2021 1:45 PM Medical Record Number: [id number] Patient Account Number: [id number] Date of Birth/Sex: [birth year] (75 y.o. F) Treating RN: Cornell Barman Primary Care Provider: [name] Other Clinician: Referring Provider: [name] Treating Provider/Extender: [name] in Treatment: 10 Debridement Performed for Wound #13 Left,Distal,Posterior Lower Leg Assessment: Performed By: Physician Tommie Sams., PA-C Debridement Type: Debridement Severity of Tissue Pre Debridement: Fat layer exposed Level of Consciousness (Pre- Awake and Alert procedure): Pre-procedure Verification/Time Out Yes - 13:59 Taken: Total Area Debrided (L x W): 1.7 (cm) x 1.7 (cm)  = 2.89 (cm) Tissue and other material Viable, Non-Viable, Slough, Subcutaneous, Biofilm, Slough debrided: Level: Skin/Subcutaneous Tissue Debridement Description: Excisional Instrument: Curette Bleeding: Minimum Hemostasis Achieved: Pressure Response to Treatment: Procedure was tolerated well Level of Consciousness (Post- Awake and Alert procedure): Post Debridement Measurements of Total Wound Length: (cm) 1.7 Width: (cm) 1.7 Depth: (cm) 0.2 Volume: (cm) 0.454 Character of Wound/Ulcer Post Debridement: Stable Severity of Tissue Post Debridement: Fat layer exposed Post Procedure Diagnosis Same as Pre-procedure Electronic Signature(s) Signed: 06/23/2021 3:25:01 PM By: [name], [name], RN, CWS, [name], [name] Signed: 06/23/2021 5:46:21 PM By: Worthy Keeler PA-C Entered By: [name], [name], RN, CWS, Kim on 06/23/2021 14:00:22 DEONKA, DONOHOE ([id number]) -------------------------------------------------------------------------------- HPI Details Patient Name: [name]. Date of Service: 06/23/2021 1:45 PM Medical Record Number: [id number] Patient Account Number: [id number] Date of Birth/Sex: [birth year] (75 y.o. F) Treating RN: Cornell Barman Primary Care Provider: [name] Other Clinician: Referring Provider: [name] Treating Provider/Extender: [name] in Treatment: 10 History of Present Illness HPI Description: Pleasant 75 year old with history of chronic venous insufficiency. No diabetes or peripheral vascular disease. Left ABI 1.29. Questionable history of left lower extremity DVT. She developed a recurrent ulceration on her left lateral calf in December 2015, which she attributes to poor diet and subsequent lower extremity edema. She underwent endovenous laser ablation of her left greater saphenous vein in 2010. She underwent laser ablation of accessory branch of left GSV in April 2016 by Dr. Kellie Simmering at [facility]. She was previously wearing Unna boots, which  she tolerated well. Tolerating 2 layer compression and cadexomer iodine. She returns to clinic for follow-up and is without new complaints. She denies any significant pain at this time. She reports persistent pain with pressure. No claudication or ischemic rest pain. No fever or chills. No drainage. READMISSION 11/13/16; this is a 75 year old woman who is not a diabetic. She is here for a review of a painful area on her left medial  lower extremity. I note that she was seen here previously last year for wound I believe to be in the same area. At that time she had undergone previously a left greater saphenous vein ablation by Dr. Kellie Simmering and she had a ablation of the anterior accessory branch of the left greater saphenous vein in March 2016. Seeing that the wound actually closed over. In reviewing the history with her today the ulcer in this area has been recurrent. She describes a biopsy of this area in 2009 that only showed stasis physiology. She also has a history of today malignant melanoma in the right shoulder for which she follows with Dr. 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 Do not see that she has ever had arterial studies in the left leg. She tells me she has a follow-up with Dr. Kellie Simmering in roughly 10 days In any case she developed the reopening of this area roughly a month ago. On the background of this she describes rapidly increasing edema which has responded to Lasix 40 mg and metolazone 2.5 mg as well as the patient's lymph massage. She has been told she has both venous insufficiency and lymphedema but she cannot tolerate compression stockings 11/28/16; the patient saw Dr. Kellie Simmering recently. Per the patient he did arterial Dopplers in the office that did not show evidence of arterial insufficiency, per the patient he stated "treat this like an ordinary venous ulcer". She  also saw her dermatologist Dr. Ronnald Ramp who felt that this was more of a vascular ulcer. In general things are improving although she arrives today with increasing bilateral lower extremity edema with weeping a deeper fluid through the wound on the left medial leg compatible with some degree of lymphedema 12/04/16; the patient's wound is fully epithelialized but I don't think fully healed. We will do another week of depression with Promogran and TCA however I suspect we'll be able to discharge her next week. This is a very unusual-looking wound which was initially a figure-of-eight type wound lying on its side surrounded by petechial like hemorrhage. She has had venous ablation on this side. She apparently does not have an arterial issue per Dr. Kellie Simmering. She saw her dermatologist thought it was "vascular". Patient is definitely going to need ongoing compression and I talked about this with her today she will go to elastic therapy after she leaves here next week 12/11/16; the patient's wound is not completely closed today. She has surrounding scar tissue and in further discussion with the patient it would appear that she had ulcers in this area in 2009 for a prolonged period of time ultimately requiring a punch biopsy of this area that only showed venous insufficiency. I did not previously pickup on this part of the history from the patient. 12/18/16; the patient's wound is completely epithelialized. There is no open area here. She has significant bilateral venous insufficiency with secondary lymphedema to a mild-to-moderate degree she does not have compression stockings.. She did not say anything to me when I was in the room, she told our intake nurse that she was still having pain in this area. This isn't unusual recurrent small open area. She is going to go to elastic therapy to obtain compression stockings. 12/25/16; the patient's wound is fully epithelialized. There is no open area here. The patient  describes some continued episodic discomfort in this area medial left calf. However  everything looks fine and healed here. She is been to elastic therapy and caught herself 15-20 mmHg stockings, they apparently were having trouble getting 20-30 mm stockings in her size 01/22/17; this is a patient we discharged from the clinic a month ago. She has a recurrent open wound on her medial left calf. She had 15 mm support stockings. I told her I thought she needed 20-30 mm compression stockings. She tells me that she has been ill with hospitalization secondary to asthma and is been found to have severe hypokalemia likely secondary to a combination of Lasix and metolazone. This morning she noted blistering and leaking fluid on the posterior part of her left leg. She called our intake nurse urgently and we was saw her this afternoon. She has not had any real discomfort here. I don't know that she's been wearing any stockings on this leg for at least 2-3 days. ABIs in this clinic were 1.21 on the right and 1.3 on the left. She is previously seen vascular surgery who does not think that there is a peripheral arterial issue. 01/30/17; Patient arrives with no open wound on the left leg. She has been to elastic therapy and obtained 20-56mhg below knee stockings and she has one on the right leg today. READMISSION 02/19/18; this PBartnikis a now 75year old patient we've had in this clinic perhaps 3 times before. I had last looked at her from January 07 December 2016 with an area on the medial left leg. We discharged her on 12/25/16 however she had to be readmitted on 01/22/17 with a recurrence. I have in my notes that we discharged her on 20-30 mm stockings although she tells me she was only wearing support hose because she cannot get stockings on predominantly related to her cervical spine surgery/issues. She has [place] on  the left with an anterior accessory branch ablation I think both of these were in 2016. On one of the previous visit she had a biopsy noted 2009 that was negative. She is not felt to have an arterial issue. She is not a diabetic. She does have a history of obstructive sleep apnea hypertension asthma as well as chronic venous insufficiency and lymphedema. On this occasion she noted 2 dry scaly patch on her left leg. She tried to put lotion on this it didn't really help. There were 2 open areas.the patient has been seeing her primary physician from 02/05/18 through 02/14/18. She had Unna boots applied. The superior wound now on the lateral left leg has closed but she's had one wound that remains open on the lateral left leg. This is not the same spot as we dealt with in 2018. ABIs in this clinic were 1.3 bilaterally PFISHER, WIECHMANN([id number] 02/26/18; patient has a small wound on the left lateral calf. Dimensions are down. She has chronic venous insufficiency and lymphedema. 03/05/18; small open area on the left lateral calf. Dimensions are down. Tightly adherent necrotic debris over the surface of the wound which was difficult to remove. Also the dressing [over collagen] stuck to the wound surface. This was removed with some difficulty as well. Change the primary dressing to Hydrofera Blue ready 03/12/18; small open area on the left lateral calf. Comes in with tightly adherent surface eschar as well as some adherent Hydrofera Blue. 03/19/18; open area on the left lateral calf. Again adherent surface eschar as well as some adherent Hydrofera Blue nonviable  subcutaneous tissue. She complained of pain all week even with the reduction from 4-3 layer compression I put on last week. Also she had an increase in her ankle and calf measurements probably related to the same thing. 03/26/18; open area on the left lateral calf. A very small open area remains here. We used silver alginate starting last week as the  Hydrofera Blue seem to stick to the wound bed. In using 4-layer compression 04/02/18; the open area in the left lateral calf at some adherent slough which I removed there is no open area here. We are able to transition her into her own compression stocking. Truthfully I think this is probably his support hose. However this does not maintain skin integrity will be limited. She cannot put over the toe compression stockings on because of neck problems hand problems etc. She is allergic to the lining layer of juxta lites. We might be forced to use extremitease stocking should this fail READMIT 11/24/2018 Patient is now a 75 year old woman who is not a diabetic. She has been in this clinic on at least 3 previous occasions largely with recurrent wounds on her left leg secondary to chronic venous insufficiency with secondary lymphedema. Her situation is complicated by inability to get stockings on and an allergy to neoprene which is apparently a component and at least juxta lites and other stockings. As a result she really has not been wearing any stockings on her legs. She tells [place] that roughly 2 or 3 weeks ago she started noticing a stinging sensation just above her ankle on the left medial aspect. She has been diagnosed with pseudogout and she wondered whether this was what she was experiencing. She tried to dress this with something she bought at the store however subsequently it pulled skin off and now she has an open wound that is not improving. She has been using Vaseline gauze with a cover bandage. She saw her primary doctor last week who put an Haematologist on her. ABIs in this clinic was 1.03 on the left 2/12; the area is on the left medial ankle. Odd-looking wound with what looks to be surface epithelialization but a multitude of small petechial openings. This clearly not closed yet. We have been using silver alginate under 3 layer compression with TCA 2/19; the wound area did not look quite as  good this week. Necrotic debris over the majority of the wound surface which required debridement. She continues to have a multitude of what looked to be small petechial openings. She reminds [place] that she had a biopsy on this initially during her first outbreak in 2015 in [place] dermatology. She expresses concern about this being a possible melanoma. 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 I assured her that this does not look anything like melanoma. Except for the petechial reaction it does look like a venous insufficiency area and she certainly has evidence of this on both sides 2/26; a difficult area on the left medial ankle. The patient clearly has chronic venous hypertension with some degree of lymphedema. The odd thing about the area is the small petechial hemorrhages. I am not really sure how to explain this. This was present last time and this is not a compression injury. We have been using Hydrofera Blue which I changed to last week 3/4; still using Hydrofera Blue. Aggressive debridement today. She does not have known arterial issues. She has seen Dr.  Kellie Simmering at [facility] vein and vascular and and has an ablation on the left. [Anterior accessory branch of the greater saphenous]. From what I remember they did not feel she had an arterial issue. The patient has had this area biopsied in 2009 at [facility] dermatology and by her recollection they said this was "stasis". She is also follow-up with dermatology locally who thought that this was more of a vascular issue 3/11; using Hydrofera Blue. Aggressive debridement today. She does not have an arterial issue. We are using 3 layer compression although we may need to go to 4. The patient has been in for multiple changes to her wrap since I last saw her a week ago. She says that the area was leaking. I do not have too much more information on what was  found 01/19/19 on evaluation today patient was actually being seen for a nurse visit when unfortunately she had the area on her left lateral lower extremity as well as weeping from the right lower extremity that became apparent. Therefore we did end up actually seeing her for a full visit with myself. She is having some pain at this site as well but fortunately nothing too significant at this point. No fevers, chills, nausea, or vomiting noted at this time. 3/18-Patient is back to the clinic with the left leg venous leg ulcer, the ulcer is larger in size, has a surface that is densely adherent with fibrinous tissue, the Hydrofera Blue was used but is densely adherent and there was difficulty in removing it. The right lower extremity was also wrapped for weeping edema. Patient has a new area over the left lateral foot above the malleolus that is small and appears to have no debris with intact surrounding skin. Patient is on increased dose of Lasix also as a means to edema management 3/25; the patient has a nonhealing venous ulcer on the medial left leg and last week developed a smaller area on the lateral left calf. We have been using Hydrofera Blue with a contact layer. 4/1; no major change in these wounds areas. Left medial and more recently left lateral calf. I tried Iodoflex last week to aid in debridement she did not tolerate this. She stated her pain was terrible all week. She took the top layer of the 4 layer compression off. 4/8; the patient actually looks somewhat better in terms of her more prominent left lateral calf wound. There is some healthy looking tissue here. She is still complaining of a lot of discomfort. 4/15; patient in a lot of pain secondary to sciatica. She is on a prednisone taper prescribed by her primary physician. She has the 2 areas one on the left medial and more recently a smaller area on the left lateral calf. Both of these just above the malleoli 4/22; her back pain  is better but she still states she is very uncomfortable and now feels she is intolerant to the [employer]. No real change in the wounds we have been using Sorbact. She has been previously intolerant to Iodoflex. There is not a lot of option about what we can use to debride this wound under compression that she no doubt needs. sHe states Ultram no longer works for her pain 4/29; no major change in the wounds slightly increased depth. Surface on the original medial wound perhaps somewhat improved however the more recent area on the lateral left ankle is 100% covered in very adherent debris we have been using Sorbact. She tolerates 4 layer compression  well and her edema control is a lot better. She has not had to come in for a nurse check 5/6; no major change in the condition of the wounds. She did consent to debridement today which was done with some difficulty. Continuing Sorbact. She did not tolerate Iodoflex. She was in for a check of her compression the day after we wrapped her last week this was adjusted but nothing much was found 5/13; no major change in the condition or area of the wounds. I was able to get a fairly aggressive debridement done on the lateral left leg wound. Even using Sorbact under compression. She came back in on Friday to have the wrap changed. She says she felt uncomfortable on the lateral aspect of her ankle. She has a long history of chronic venous insufficiency including previous ablation surgery on this side. 5/20-Patient returns for wounds on left leg with both wounds covered in slough, with the lateral leg wound larger in size, she has been in 3 layer compression and felt more comfortable, she describes pain in ankle, in leg and pins and needles in foot, and is about to try Pamelor for this 6/3; wounds on the left lateral and left medial leg. The area medially which is the most recent of the 2 seems to have had the largest increase in Wood, JADI WARM.  ([id number]) dimensions. We have been using Sorbac to try and debride the surface. She has been to see orthopedics they apparently did a plain x-ray that was indeterminant. Diagnosed her with neuropathy and they have ordered an MRI to determine if there is underlying osteomyelitis. This was not high on my thought list but I suppose it is prudent. We have advised her to make an appointment with vein and vascular in [place]. She has a history of a left greater saphenous and accessory vein ablations I wonder if there is anything else that can be done from a surgical point of view to help in these difficult refractory wounds. We have previously healed this wound on one occasion but it keeps on reopening [medial side] 6/10; deep tissue culture I did last week I think on the left medial wound showed both moderate E. coli and moderate staph aureus [MSSA]. She is going to require antibiotics and I have chosen Augmentin. We have been using Sorbact and we have made better looking wound surface on both sides but certainly no improvement in wound area. She was back in last Friday apparently for a dressing changes the wrap was hurting her outer left ankle. She has not managed to get a hold of vein and vascular in [place]. We are going to have to make her that appointment 6/17; patient is tolerating the Augmentin. She had an MRI that I think was ordered by orthopedic surgeon this did not show osteomyelitis or an abscess did suggest cellulitis. We have been using Sorbact to the lateral and medial ankles. We have been trying to arrange a follow-up appointment with vein and vascular in [place] or did her original ablations. We apparently an area sent the request to vein and vascular in [facility] 6/24; patient has completed the Augmentin. We do not yet have a vein and vascular appointment in [place]. I am not sure what the issue is here we have asked her to call tomorrow. We are using Sorbact. Making some  improvements and especially the medial wound. Both surfaces however look better medial and lateral. 7/1; the patient has been in contact with vein and vascular in  [place] but has not yet received an appointment. Using Sorbact we have gradually improve the wound surface with no improvement in surface area. She is approved for Apligraf but the wound surface still is not completely viable. She has not had to come in for a dressing change 7/8; the patient has an appointment with vein and vascular on 7/31 which is a Friday afternoon. She is concerned about getting back here for [place] to dress her wounds. I think it is important to have them goal for her venous reflux/history of ablations etc. to see if anything else can be done. She apparently tested positive for 1 of the blood tests with regards to lupus and saw a rheumatologist. He has raised the issue of vasculitis again. I have had this thought in the past however the evidence seems overwhelming that this is a venous reflux etiology. If the rheumatologist tells me there is clinical and laboratory investigation is positive for lupus I will rethink this. 7/15; the patient's wound surfaces are quite a bit better. The medial area which was her original wound now has no depth although the lateral wound which was the more recent area actually appears larger. Both with viable surfaces which is indeed better. Using Sorbact. I wanted to use Apligraf on her however there is the issue of the vein and vascular appointment on 7/31 at 2:00 in the afternoon which would not allow her to get back to be rewrapped and they would no doubt remove the graft 7/22; the patient's wound surfaces have moderate amount of debris although generally look better. The lateral one is larger with 2 small satellite areas superiorly. We are waiting for her vein and vascular appointment on 7/31. She has been approved for Apligraf which I would like to use after th 7/29; wound surfaces  have improved no debridement is required we have been using Sorbact. She sees vein and vascular on Friday with this so question of whether anything can be done to lessen the likelihood of recurrence and/or speed the healing of these areas. She is already had previous ablations. She no doubt has severe venous hypertension 8/5-Patient returns at 1 week, she was in [place] for 3 days by her podiatrist, we have been using so backed to the wound, she has increased pain in both the wounds on the left lower leg especially the more distal one on the lateral aspect 8/12-Patient returns at 1 week and she is agreeable to having debridement in both wounds on her left leg today. We have been using Sorbact, and vascular studies were reviewed at last visit 8/19; the patient arrives with her wounds fairly clean and no debridement is required. We have used Sorbact which is really done a nice job in cleaning up these very difficult wound surfaces. The patient saw Dr. Donzetta Matters of vascular surgery on 7/31. He did not feel that there was an arterial component. He felt that her treated greater saphenous vein is adequately addressed and that the small saphenous vein did not appear to be involved significantly. She was also noted to have deep venous reflux which is not treatable. Dr. Donzetta Matters mentioned the possibility of a central obstructive component leading to reflux and he offered her central venography. She wanted to discuss this or think about it. I have urged her to go ahead with this. She has had recurrent difficult wounds in these areas which do heal but after months in the clinic. If there is anything that can be done to reduce the  likelihood of this I think it is worth it. 9/2 she is still working towards getting follow-up with Dr. Donzetta Matters to schedule her CT. Things are quite a bit worse venography. I put Apligraf on 2 weeks ago on both wounds on the medial and lateral part of her left lower leg. She arrives in clinic  today with 3 superficial additional wounds above the area laterally and one below the wound medially. She describes a lot of discomfort. I think these are probably wrapped injuries. Does not look like she has cellulitis. 07/20/2019 on evaluation today patient appears to be doing somewhat poorly in regard to her lower extremity ulcers. She in fact showed signs of erythema in fact we may even be dealing with an infection at this time. Unfortunately I am unsure if this is just infection or if indeed there may be some allergic reaction that occurred as a result of the Apligraf application. With that being said that would be unusual but nonetheless not impossible in this patient is one who is unfortunately allergic to quite a bit. Currently we have been using the Sorbact which seems to do as well as anything for her. I do think we may want to obtain a culture today to see if there is anything showing up there that may need to be addressed. 9/16; noted that last week the wounds look worse in 1 week follow-up of the Apligraf. Using Sorbact as of 2 days ago. She arrives with copious amounts of drainage and new skin breakdown on the back of the left calf. The wounds arm more substantial bilaterally. There is a fair amount of swelling in the left calf no overt DVT there is edema present I think in the left greater than right thigh. She is supposed to go on 9/28 for CT venography. The wounds on the medial and lateral calf are worse and she has new skin breakdown posteriorly at least new for me. This is almost developing into a circumferential wound area The Apligraf was taken off last week which I agree with things are not going in the right direction a culture was done we do not have that back yet. She is on Augmentin that she started 2 days ago 9/23; dressing was changed by her nurses on Monday. In general there is no improvement in the wound areas although the area looks less angry than last week. She did get  Augmentin for MSSA cultured on the 14th. She still appears to have too much swelling in the left leg even with 3 layer compression 9/30; the patient underwent her procedure on 9/28 by Dr. Donzetta Matters at [facility]. She was discovered to have the common iliac vein measuring 12.2 mm but at the level of L4-L5 measured 3 mm. After stenting it measured 10 mm. 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 Rouleaux flow in the common femoral and femoral vein was observed much improved after stenting. We are using silver alginate to the wounds on the medial and lateral ankle on the left. 4 layer compression 10/7; the patient had fluid swelling around her knee and 4 layer compression. At the advice of vein and vascular this was reduced to 3 layer which she is tolerating better. We have been using silver alginate under 3 layer compression since last Friday 10/14; arrives with the areas on the left ankle looking a lot better. Inflammation in the area also a lot better. She came in for a nurse check on 10/9 10/21;  continued nice improvement. Slight improvements in surface area of both the medial and lateral wounds on the left. A lot of the satellite lesions in the weeping erythema around these from stasis dermatitis is resolved. We have been using silver alginate MAELEY, SUESS ([id number]) 10/28; general improvement in the entire wound areas although not a lot of change in dimensions the wound certainly looks better. There is a lot less in terms of venous inflammation. Continue silver alginate this week however look towards Hydrofera Blue next week 11/4; very adherent debris on the medial wound left wound is not as bad. We have been using silver alginate. Change to [facility] today 11/11; very adherent debris on both wound areas. She went to vein and vascular last week and follow-up they put in [place] boot on this today. He says the [facility] was adherent. Wound is definitely  not as good as last week. Especially on the left there the satellite lesions look more prominent 11/18; absolutely no better. erythema on lateral aspect with tenderness. 09/30/2019 on evaluation today patient appears to actually be doing better. Dr. Dellia Nims did put her on doxycycline last week which I do believe has helped her at this point. Fortunately there is no signs of active infection at this time. No fevers, chills, nausea, vomiting, or diarrhea. I do believe he may want extend the doxycycline for 7 additional days just to ensure everything does completely cleared up the patient is in agreement with that plan. Otherwise she is going require some sharp debridement today 12/2; patient is completing a 2-week course of doxycycline. I gave her this empirically for inflammation as well as infection when I last saw her 2 weeks ago. All of this seems to be better. She is using silver alginate she has the area on the medial aspect of the larger area laterally and the 2 small satellite regions laterally above the major wound. 12/9; the patient's wound on the left medial and left lateral calf look really quite good. We have been using silver alginate. She saw vein and vascular in follow-up on 10/09/2019. She has had a previous left greater saphenous vein ablation by Dr. Oscar La in 2016. More recently she underwent a left common iliac vein stent by Dr. 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 The swelling is improved and certainly the wounds have improved. The patient shows [place] today area on the right medial calf there is almost no wound but leaking lymphedema. She says she start this started 3 or 4 days ago. She did not traumatize it. It is not painful. She does not wear compression on that side 12/16; the patient continues to do well laterally. Medially still requiring debridement. The area on the right calf did not materialize to anything and is not currently open. We wrapped this last time. She  has support stockings for that leg although I am not sure they are going to provide adequate compression 12/23; the lateral wound looks stable. Medially still requiring debridement for tightly adherent fibrinous debris. We've been using silver alginate. Surface area not any different 12/30; neither wound is any better with regards to surface and the area on the left lateral is larger. I been using silver alginate to the left lateral which look quite good last week and Sorbact to the left medial 11/11/2019. Lateral wound area actually looks better and somewhat smaller. Medial still requires a very aggressive debridement today. We have been using Sorbact on both wound areas 1/13; not much better  still adherent debris bilaterally. I been using Sorbact. She has severe venous hypertension. Probably some degree of dermal fibrosis distally. I wonder whether tighter compression might help and I am going to try that today. We also need to work on the bioburden 1/20; using Sorbact. She has severe venous hypertension status post stent placement for pelvic vein compression. We applied gentamicin last time to see if we could reduce bioburden I had some discussion with her today about the use of pentoxifylline. This is occasionally used in this setting for wounds with refractory venous insufficiency. However this interacts with Plavix. She tells me that she was put on this after stent placement for 3 months. She will call Dr. Claretha Cooper office to discuss 1/27; we are using gentamicin under Sorbact. 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 She has a stent. Wound medially is measuring smaller this week. Laterally measuring slightly larger although she has some satellite lesions superiorly 2/3; gentamicin under Sorbact under 4-layer compression. 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 She has a stent on Plavix. Her wounds are measuring smaller this week. More  substantially laterally where there is a satellite lesion superiorly. 2/10; gentamicin under Sorbac. 4-layer compression. Patient communicated with Dr. Donzetta Matters at vein and vascular in [place]. He is okay with the patient coming off Plavix I will therefore start her on pentoxifylline for a 1 month trial. In general her wounds look better today. I had some concerns about swelling in the left thigh however she measures 61.5 on the right and 63 on the mid thigh which does not suggest there is any difficulty. The patient is not describing any pain. 2/17; gentamicin under Sorbac 4-layer compression. She has been on pentoxifylline for 1 week and complains of loose stool. No nausea she is eating and drinking well 2/24; the patient apparently came in 2 days ago for a nurse visit when her wrap fell down. Both areas look a little worse this week macerated medially and satellite lesions laterally. Change to silver alginate today 3/3; wounds are larger today especially medially. She also has more swelling in her foot lower leg and I even noted some swelling in her posterior thigh which is tender. I wonder about the patency of her stent. Fortuitously she sees Dr. Claretha Cooper group on Friday 3/10; Mrs. Pehl was seen by vein and vascular on 3/5. The patient underwent ultrasound. There was no evidence of thrombosis involving the IVC no evidence of thrombosis involving the right common iliac vein there is no evidence of thrombosis involving the right external iliac vein the left external vein is also patent. The right common iliac vein stent appears patent bilateral common femoral veins are compressible and appear patent. I was concerned about the left common iliac stent however it looks like this is functional. She has some edema in the posterior thigh that was tender she still has that this week. I also note they had trouble finding the pulses in her left foot and booked her for an ABI baseline in 4 weeks. She will  follow up in 6 months for repeat IVC duplex. The patient stopped the pentoxifylline because of diarrhea. It does not look like that was being effective in any case. I have advised her to go back on her aspirin 81 mg tablet, vascular it also suggested this 3/17; comes in today with her wound surfaces a lot better. The excoriations from last week considerably better probably secondary to the TCA. We have been using silver alginate  3/24; comes in today with smaller wounds both medially and laterally. Both required debridement. There are 2 small satellite areas superiorly laterally. She also has a very odd bandlike area in the mid calf almost looking like there was a weakness in the wrap in a localized area. I would write this off as being this however anteriorly she has a small raised ballotable area that is very tender almost reminiscent of an abscess but there was no obvious purulent surface to it. 02/04/20 upon evaluation today patient appears to be doing fairly well in regard to her wounds today. Fortunately there is no signs of active infection at this time. No fevers, chills, nausea, vomiting, or diarrhea. She has been tolerating the dressing changes without complication. Fortunately I feel like she is showing signs of improvement although has been sometime since have seen her. Nonetheless the area of concern that Dr. Dellia Nims had last week where she had possibly an area of the wrap that was we can allow the leg to bulge appears to be doing significantly better today there is no signs of anything worsening. YARA, WEYL ([id number]) 4/7; the patient's wounds on her medial and lateral left leg continue to contract. We have been using a regular alginate. Last week she developed an area on the right medial lower leg which is probably a venous ulcer as well. 4/14; the wounds on her left medial and lateral lower leg continue to contract. Surface eschar. We have been using regular alginate. The area  on the right medial lower leg is closed. We have been putting both legs under 4-layer contraction. The patient went back to see vein and vascular she had arterial studies done which were apparently "quite good" per the patient although I have not read their notes I have never felt she had an arterial issue. 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 This is been longstanding and refractory to exercise, leg elevation and longstanding use of compression wraps in our clinic as well as compression stockings on the times we have been able to get these to heal 4/21; we thought she actually might be close this week however she arrives in clinic with a lot of edema in her upper left calf and into her posterior thigh. This is been an intermittent problem here. She says the wrap fell down but it was replaced with a nurse visit on Monday. We are using calcium alginate to the wounds and the wound sizes there not terribly larger than last week but there is a lot more edema 4/28; again wound edges are smaller on both sides. Her edema is better controlled than last time. She is obtained her compression pumps from [employer] although they have not been to her home to set these up. 5/5; left medial and left lateral both look stable. I am not sure the medial is any smaller. We have been using calcium alginate under 4-layer compression. oShe had an area on the right medial. This was eschared today. We have been wrapping this as well. She does not tolerate external compression stockings due to a history of various contact allergies. She has her compression pumps however the representative from the company is coming on her to show her how to use these tomorrow 5/19; patient with severe chronic venous insufficiency secondary to central venous disease. She had a stent placed in her left common iliac vein. She has done better since but still difficult to control wounds.  She comes in today  with nothing open on the right leg. Her areas on the left medial and left lateral are just about closed. We are using calcium alginate under 4-layer compression. She is using her external compression pumps at home She only has 15-20 support stockings. States she cannot get anything tighter than that on. 03/30/20-Patient returns at 1 week, the wounds on the left leg are both slightly bigger, the last week she was on 3 layer compression which started to slide down. She is starting to use her lymphedema pumps although she stated on 1 day her right ankle started to swell up and she have to stop that day. Unfortunately the open area seem to oscillate between improving to the point of healing and then flaring up all to do with effectiveness of compression or lack of due to the left leg topography not keeping the compression wraps from rolling down 6/2 patient comes in with a 15/20 mmHg stocking on the right leg. She tells me that she developed a lot of swelling in her ankles she saw orthopedics she was felt to possibly be having a flare of pseudogout versus some other type of arthritis. She was put on steroids for a respiratory issue so that helps with the inflammation. She has not been using the pumps all week. She thinks the left thigh is more swollen than usual and I would agree with that. She has an appointment with Dr. Donzetta Matters 9 days or so from now 6/9; both wounds on the left medial and left lateral are smaller. We have been using calcium alginate under compression. She does not have an open wound on the right leg she is using a stocking and her compression pumps things are going well. She has an appointment with Dr. Donzetta Matters with regards to her stent in the left common iliac vein 6/16; the wounds on the left medial and left lateral ankle continues to contract. The patient saw Dr. Donzetta Matters and I think he seems satisfied. Ordered follow-up venous reflux studies on both sides in September.  Cautioned that she may need thigh-high stockings. She has been using calcium alginate under compression on the left and her own stocking on the right leg. She tells [place] there are no open wounds on the right 6/23; left lateral is just about closed. Medial required debridement today. We have been using calcium alginate. Extensive discussion about the compression pumps she is only using these on 25 mmHg states she could not take 40 or 30 when the wrap came out to her home to demonstrate these. He said they should not feel tight 6/30; the left lateral wound has a slight amount of eschar. . The area medially is about the same using Hydrofera Blue. 7/7; left lateral wound still has some eschar. I will remove this next week may be closed. The area medially is very small using Hydrofera Blue with improvement. Unfortunately the stockings fell down. Unfortunately the blisters have developed at the edge of where the wrap fell. When this happened she says her legs hurt she did not use her pumps. We are not open Monday for her to come in and change the wraps and she had an appointment yesterday. She also tells me that she is going to have an MRI of her back. She is having pain radiating into her left anterior leg she thinks her from an L5 disc. She saw Dr. Ellene Route of neurosurgery 7/14; the area on the left lateral ankle area is closed. Still a small area medially however it  looks better as well. We have been using Hydrofera Blue under 4-layer compression 7/21; left lateral ankle is still closed however her wound on the medial left calf is actually larger. This is probably because Hydrofera Blue got stuck to the wound. She came in for a nurse change on Friday and will do that again this week I was concerned about the amount of swelling that she had last week however she is using her compression pumps twice a day and the swelling seems well controlled 7/28; remaining wound on the left medial lower leg is smaller. We  have been using moistened silver collagen under compression she is coming back for a nurse visit. For reasons that were not really clear she was just keeping her legs elevated and not using her compression pumps. I have asked her to use the compression pumps. She does not have any wounds on the right leg 06/15/20-Patient returns at 2 weeks, her LLE edema is worse and she developed a blister wound that is new and has bigger posterior calf wound on right, we are using Prisma with pad, 4 layer compression. she has been on lasix 40 mg daily 8/18; patient arrives today with things a lot worse than I remember from a few weeks ago. She was seen last week. Noted that her edema was worse and that she now had a left lateral wound as well as deteriorating edema in the medial and posterior part of the lower leg. She says she is using his or her external compression pumps once a day although I wonder about the compliance. 8/25; weeping area on the right medial lower leg. This had actually gotten a small localized area of her compression stocking wet. oOn the left side there is a large denuded area on the posterior medial lower leg and smaller area on the lateral. This was not the original areas that we dealt with. 9/1 the patient's wound on the left leg include the left lateral and left posterior. Larger superficial wounds weeping. She has very poor edema control. Tender localized edema in the left lower medial ankle/heel probably because of localized wrap issues. She freely admits she is not using the compression pumps. She has been up on her feet a lot. She thinks the hydrofera blue is contributing to the pain she is experiencing.. This is a complaint that I have occasionally heard 9/8; really not much improvement. The patient is still complaining of a lot of pain particularly when she uses compression pumps. I switched her to silver alginate last time because she found the Hydrofera Blue to be irritating. I  don't hear much difference in her description with the silver alginate. 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 She has follow-up with Dr. Donzetta Matters I think for ultrasound next month MAILYN, HORITA ([id number]) 9/15; quite a bit of improvement today. We have less edema and more epithelialization in both of her wound areas on the left medial and left lateral calf. These are not the site of her original wounds in this area. She says she has been using her compression pumps for 30 minutes twice a day, there pain issues that never quite understood. Silver alginate as the primary dressing 9/22; continued improvement. Both areas medially and laterally still have a small open area there is some eschar. She continues to complain of left medial ankle pain. Swelling in the leg is in much better condition.  We have been using silver alginate 9/29; continued improvement. Both areas medially and laterally in the left calf look as though they are close some minor surface eschar but I think this is epithelialized. She comes in today saying she has a ruptured disc at L4-L5 cannot bend over to put on her stockings. 10/6; patient comes in today with no open wounds on either leg. However her edema on the left leg in the upper one third of the lower leg is poorly controlled nonpitting. She says that she could not use the pumps for 2 days and then she has been using the last couple of days. It is not clear to me she has been able to get her stocking on. She has back problems. Mrs. Garde has severe chronic venous insufficiency with secondary lymphedema. Her venous insufficiency is partially centrally mediated and that she is now post stent in the left common iliac vein. [facility] Thurner's syndrome/physiology]. She follows up with them on 10/15. She wears 20/30 below-knee stockings. She is supposed to use compression pumps at home although I think  her compliance about with this is been less than 100%. I have asked her to use these 3 times a day. Finally I think she has lipodermatosclerosis in the left lower leg with an inverted bottle sign. It is been a major problem controlling the edema in the left leg. The right leg we have had wounds on but not as significant a problem is on the left READMISSION 04/12/2021 Mrs. Rufener is a 75 year old woman we know well in this clinic. She has severe chronic venous insufficiency. 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 I believe she has had bilateral greater saphenous vein ablation in the past as well. She tells me that this wound opened sometime in March. She had a fall and thinks it was initially abrasion. She developed areas she describes as little blisters on the anterior part of her leg and she saw dermatology and was treated for methicillin staph aureus with several rounds of antibiotics. She has been using support stockings on the left leg and says this is the only thing she can get on. Her compression pump use maybe once a day she says if she did not use one she use the other. She comes in today with incredible swelling in the left leg with a wound on the left posterior calf. She has been using Neosporin to this previously a hydrocolloid. 6/15; patient arrives back for 1 week follow-up.Marland Kitchen Apparently her wrap fell down she did not call us to replace this. He has poor edema control. She only uses her compression pumps once a day 6/29; patient presents for 1 week follow-up. She has tolerated the compression wrap well. We have been using Iodoflex under the wrap. She has no issues or complaints today. 7/6; patient presents for 1 week follow-up. She states that the compression wrap rolled down her leg 4 days ago. She has been trying to keep the area covered but has no dressings at home to use. She denies signs of infection. 7/13; patient presents for 1 week  follow-up. She states that her compression wrap rolled down her right leg and she called our office and had it placed a few days ago. She has been tolerating the current wrap well. She states that the Iodoflex is causing a burning sensation. She denies signs of infection. 7/27; patient presents for 1 week follow-up She has tolerated the compression wrap  well with Hydrofera Blue underneath. She has now developed a wound to her right lower extremity. She reports having a culture done To this area by her dermatologist that she reports is negative. She currently denies signs of infection. 8/30; patient presents for 1 week follow-up. On the left side she has tolerated the compression wrap well with Hydrofera Blue underneath. She reports some discomfort to her right lower extremity with the 3 layer compression. She currently denies signs of infection. 06/14/2021 upon evaluation today patient's wound actually appears to be doing decently well based on what I am seeing currently. She actually has 2 areas 1 on the left distal/posterior lower leg and the other on the right lower leg. Subsequently the measurements are roughly the same may be slightly smaller but in general have not made any trend towards getting worse which is great news. 06/23/2021 upon evaluation today patient appears to be doing pretty well in regard to her wounds. On the right she is having difficulty with sciatic pain and this subsequently has led to her taking the wrap off over the last week her leg is more swollen she is also been on prednisone for 10 days. With that being said I think we may want to just use a compression sock on the left that way she will not have to worry about the compression wrap being in place that she cannot get off. With that being said I do believe as well that the patient is going to need to continue with the wrapping on the left we will also need to do a little bit of sharp debridement today. Electronic  Signature(s) Signed: 06/23/2021 5:34:48 PM By: Worthy Keeler PA-C Entered By: Worthy Keeler on 06/23/2021 17:34:48 Grabill, [name] -------------------------------------------------------------------------------- Physical Exam Details Patient Name: LEIANNA, FUJII. Date of Service: 06/23/2021 1:45 PM Medical Record Number: [id number] Patient Account Number: [id number] Date of Birth/Sex: [birth year] (75 y.o. F) Treating RN: Cornell Barman Primary Care Provider: [name] Other Clinician: Referring Provider: [name] Treating Provider/Extender: [name] in Treatment: 3 Constitutional [name] and well-hydrated in no acute distress. Respiratory normal breathing without difficulty. Psychiatric this patient is able to make decisions and demonstrates good insight into disease process. Alert and Oriented x 3. pleasant and cooperative. Notes Upon inspection patient's wound bed actually showed signs of good granulation epithelization at this point. Fortunately there does not appear to be any evidence of active infection which is great news and overall very pleased with where things stand today. No fevers, chills, nausea, vomiting, or diarrhea. Electronic Signature(s) Signed: 06/23/2021 5:35:12 PM By: Worthy Keeler PA-C Entered By: Worthy Keeler on 06/23/2021 17:35:12 Breault, [name] -------------------------------------------------------------------------------- Physician Orders Details Patient Name: RAQUELLE, GISMONDI. Date of Service: 06/23/2021 1:45 PM Medical Record Number: [id number] Patient Account Number: [id number] Date of Birth/Sex: [birth year] (75 y.o. F) Treating RN: Cornell Barman Primary Care Provider: [name] Other Clinician: Referring Provider: [name] Treating Provider/Extender: [name] in Treatment: 10 Verbal / Phone Orders: No Diagnosis Coding Follow-up Appointments Wound #13  Left,Distal,Posterior Lower Leg o Return Appointment in 1 week. o Nurse Visit as needed Bathing/ Shower/ Hygiene o May shower with wound dressing protected with water repellent cover or cast protector. Anesthetic (Use 'Patient Medications' Section for Anesthetic Order Entry) o Lidocaine applied to wound bed Edema Control - Lymphedema / Segmental Compressive Device / Other o Elevate, Exercise Daily and Avoid Standing for Long Periods of Time. o Elevate legs to the level  of the heart and pump ankles as often as possible o Elevate leg(s) parallel to the floor when sitting. o Compression Pump: Use compression pump on left lower extremity for 60 minutes, twice daily. - TWICE DAILY for 1 hour o Compression Pump: Use compression pump on right lower extremity for 60 minutes, twice daily. - TWICE DAILY for 1 hour Wound Treatment Wound #13 - Lower Leg Wound Laterality: Left, Posterior, Distal Cleanser: Soap and Water 1 x Per Week/30 Days Discharge Instructions: Gently cleanse wound with antibacterial soap, rinse and pat dry prior to dressing wounds Topical: Triamcinolone Acetonide Cream, 0.1%, 15 (g) tube 1 x Per Week/30 Days Discharge Instructions: Peri wound Apply as directed by provider. Primary Dressing: Prisma 4.34 (in) 1 x Per Week/30 Days Discharge Instructions: Moisten w/normal saline or sterile water; Cover wound as directed. Do not remove from wound bed. Secondary Dressing: ABD Pad 5x9 (in/in) 1 x Per Week/30 Days Discharge Instructions: Cover with ABD pad Compression Wrap: Medichoice 4 layer Compression System, 35-40 mmHG 1 x Per Week/30 Days Discharge Instructions: Apply multi-layer wrap as directed. Wound #14 - Lower Leg Wound Laterality: Right Cleanser: Soap and Water 1 x Per Week/30 Days Discharge Instructions: Gently cleanse wound with antibacterial soap, rinse and pat dry prior to dressing wounds Topical: Triamcinolone Acetonide Cream, 0.1%, 15 (g) tube 1 x Per  Week/30 Days Discharge Instructions: Peri wound Apply as directed by provider. Secondary Dressing: ABD Pad 5x9 (in/in) 1 x Per Week/30 Days Discharge Instructions: Cover with ABD pad Secondary Dressing: Non-Adherent Pad 3x8 (in/in) 1 x Per Week/30 Days Compression Wrap: Profore Lite LF 3 Multilayer Compression Bandaging System 1 x Per Week/30 Days Discharge Instructions: Apply 3 multi-layer wrap as prescribed. Electronic Signature(s) PEBBLES, NICHOL ([id number]) Signed: 06/23/2021 3:25:01 PM By: [name], [name], RN, CWS, [name], [name] Signed: 06/23/2021 5:46:21 PM By: Worthy Keeler PA-C Entered By: [name], [name], RN, CWS, Kim on 06/23/2021 14:04:04 KACELYNN, AMYOT ([id number]) -------------------------------------------------------------------------------- Problem List Details Patient Name: ANALICE, HOLTZ. Date of Service: 06/23/2021 1:45 PM Medical Record Number: [id number] Patient Account Number: [id number] Date of Birth/Sex: [birth year] (75 y.o. F) Treating RN: Cornell Barman Primary Care Provider: [name] Other Clinician: Referring Provider: [name] Treating Provider/Extender: [name] in Treatment: 10 Active Problems ICD-10 Encounter Code Description Active Date MDM Diagnosis I87.332 Chronic venous hypertension (idiopathic) with ulcer and inflammation of 04/12/2021 No Yes left lower extremity I89.0 Lymphedema, not elsewhere classified 04/12/2021 No Yes L97.221 Non-pressure chronic ulcer of left calf limited to breakdown of skin 04/12/2021 No Yes S81.801A Unspecified open wound, right lower leg, initial encounter 05/31/2021 No Yes Inactive Problems Resolved Problems Electronic Signature(s) Signed: 06/23/2021 2:19:19 PM By: Worthy Keeler PA-C Entered By: Worthy Keeler on 06/23/2021 14:19:19 Donna, [name] -------------------------------------------------------------------------------- Progress Note Details Patient Name: DESERE, TUSZYNSKI. Date of  Service: 06/23/2021 1:45 PM Medical Record Number: [id number] Patient Account Number: [id number] Date of Birth/Sex: [birth year] (75 y.o. F) Treating RN: Cornell Barman Primary Care Provider: [name] Other Clinician: Referring Provider: [name] Treating Provider/Extender: [name] in Treatment: 10 Subjective Chief Complaint Information obtained from Patient Left calf venous stasis ulceration. 11/13/16; the patient is here for a left calf recurrent ulceration which is painful and has been present for the last month 01/22/17; the patient re-presents here for recurrent difficulties with blistering and leaking fluid on her left posterior calf 12/10/18; the patient returns to clinic for a wound on the left medial calf 04/12/2021; left lower extremity wound,  05/31/21; right leg wound History of Present Illness (HPI) Pleasant 75 year old with history of chronic venous insufficiency. No diabetes or peripheral vascular disease. Left ABI 1.29. Questionable history of left lower extremity DVT. She developed a recurrent ulceration on her left lateral calf in December 2015, which she attributes to poor diet and subsequent lower extremity edema. She underwent endovenous laser ablation of her left greater saphenous vein in 2010. She underwent laser ablation of accessory branch of left GSV in April 2016 by Dr. Kellie Simmering at [facility]. She was previously wearing Unna boots, which she tolerated well. Tolerating 2 layer compression and cadexomer iodine. She returns to clinic for follow-up and is without new complaints. She denies any significant pain at this time. She reports persistent pain with pressure. No claudication or ischemic rest pain. No fever or chills. No drainage. READMISSION 11/13/16; this is a 75 year old woman who is not a diabetic. She is here for a review of a painful area on her left medial lower extremity. I note that she was seen here previously last year for wound I believe to be in  the same area. At that time she had undergone previously a left greater saphenous vein ablation by Dr. Kellie Simmering and she had a ablation of the anterior accessory branch of the left greater saphenous vein in March 2016. Seeing that the wound actually closed over. In reviewing the history with her today the ulcer in this area has been recurrent. She describes a biopsy of this area in 2009 that only showed stasis physiology. She also has a history of today malignant melanoma in the right shoulder for which she follows with Dr. 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 Do not see that she has ever had arterial studies in the left leg. She tells me she has a follow-up with Dr. Kellie Simmering in roughly 10 days In any case she developed the reopening of this area roughly a month ago. On the background of this she describes rapidly increasing edema which has responded to Lasix 40 mg and metolazone 2.5 mg as well as the patient's lymph massage. She has been told she has both venous insufficiency and lymphedema but she cannot tolerate compression stockings 11/28/16; the patient saw Dr. Kellie Simmering recently. Per the patient he did arterial Dopplers in the office that did not show evidence of arterial insufficiency, per the patient he stated "treat this like an ordinary venous ulcer". She also saw her dermatologist Dr. Ronnald Ramp who felt that this was more of a vascular ulcer. In general things are improving although she arrives today with increasing bilateral lower extremity edema with weeping a deeper fluid through the wound on the left medial leg compatible with some degree of lymphedema 12/04/16; the patient's wound is fully epithelialized but I don't think fully healed. We will do another week of depression with Promogran and TCA however I suspect we'll be able to discharge her next week. This is a very  unusual-looking wound which was initially a figure-of-eight type wound lying on its side surrounded by petechial like hemorrhage. She has had venous ablation on this side. She apparently does not have an arterial issue per Dr. Kellie Simmering. She saw her dermatologist thought it was "vascular". Patient is definitely going to need ongoing compression and I talked about this with her today she will go to elastic therapy after she leaves here next week 12/11/16; the  patient's wound is not completely closed today. She has surrounding scar tissue and in further discussion with the patient it would appear that she had ulcers in this area in 2009 for a prolonged period of time ultimately requiring a punch biopsy of this area that only showed venous insufficiency. I did not previously pickup on this part of the history from the patient. 12/18/16; the patient's wound is completely epithelialized. There is no open area here. She has significant bilateral venous insufficiency with secondary lymphedema to a mild-to-moderate degree she does not have compression stockings.. She did not say anything to me when I was in the room, she told our intake nurse that she was still having pain in this area. This isn't unusual recurrent small open area. She is going to go to elastic therapy to obtain compression stockings. 12/25/16; the patient's wound is fully epithelialized. There is no open area here. The patient describes some continued episodic discomfort in this area medial left calf. However everything looks fine and healed here. She is been to elastic therapy and caught herself 15-20 mmHg stockings, they apparently were having trouble getting 20-30 mm stockings in her size 01/22/17; this is a patient we discharged from the clinic a month ago. She has a recurrent open wound on her medial left calf. She had 15 mm support stockings. I told her I thought she needed 20-30 mm compression stockings. She tells me that she has been ill  with hospitalization secondary to asthma and is been found to have severe hypokalemia likely secondary to a combination of Lasix and metolazone. This morning she noted blistering and leaking fluid on the posterior part of her left leg. She called our intake nurse urgently and we was saw her this afternoon. She has not had any real discomfort here. I don't know that she's been wearing any stockings on this leg for at least 2-3 days. ABIs in this clinic were 1.21 on the right and 1.3 on the left. She is previously seen vascular surgery who does not think that there is a peripheral arterial issue. 01/30/17; Patient arrives with no open wound on the left leg. She has been to elastic therapy and obtained 20-67mhg below knee stockings and she has one on the right leg today. READMISSION 02/19/18; this PZopfiis a now 75year old patient we've had in this clinic perhaps 3 times before. I had last looked at her from January 07 December 2016 with an area on the medial left leg. We discharged her on 12/25/16 however she had to be readmitted on 01/22/17 with a recurrence. I have in my notes that we discharged her on 20-30 mm stockings although she tells me she was only wearing support hose because she cannot get stockings on predominantly related to her cervical spine surgery/issues. [place] with an anterior accessory branch ablation I think both of these were in 2016. On one of the previous visit she had a biopsy noted 2009 that was negative. She is not felt to have an arterial issue. She is not a diabetic. She does have a PCASSIDI, GUYMON ([id number] history of obstructive sleep apnea hypertension asthma as well as chronic venous insufficiency and lymphedema. On this occasion she noted 2 dry scaly patch on her left leg. She tried to put lotion on this it didn't really help. There were 2 open  areas.the patient has been seeing her primary  physician from 02/05/18 through 02/14/18. She had Unna boots applied. The superior wound now on the lateral left leg has closed but she's had one wound that remains open on the lateral left leg. This is not the same spot as we dealt with in 2018. ABIs in this clinic were 1.3 bilaterally 02/26/18; patient has a small wound on the left lateral calf. Dimensions are down. She has chronic venous insufficiency and lymphedema. 03/05/18; small open area on the left lateral calf. Dimensions are down. Tightly adherent necrotic debris over the surface of the wound which was difficult to remove. Also the dressing [over collagen] stuck to the wound surface. This was removed with some difficulty as well. Change the primary dressing to Hydrofera Blue ready 03/12/18; small open area on the left lateral calf. Comes in with tightly adherent surface eschar as well as some adherent Hydrofera Blue. 03/19/18; open area on the left lateral calf. Again adherent surface eschar as well as some adherent Hydrofera Blue nonviable subcutaneous tissue. She complained of pain all week even with the reduction from 4-3 layer compression I put on last week. Also she had an increase in her ankle and calf measurements probably related to the same thing. 03/26/18; open area on the left lateral calf. A very small open area remains here. We used silver alginate starting last week as the Hydrofera Blue seem to stick to the wound bed. In using 4-layer compression 04/02/18; the open area in the left lateral calf at some adherent slough which I removed there is no open area here. We are able to transition her into her own compression stocking. Truthfully I think this is probably his support hose. However this does not maintain skin integrity will be limited. She cannot put over the toe compression stockings on because of neck problems hand problems etc. She is allergic to the lining layer of juxta lites. We  might be forced to use extremitease stocking should this fail READMIT 11/24/2018 Patient is now a 75 year old woman who is not a diabetic. She has been in this clinic on at least 3 previous occasions largely with recurrent wounds on her left leg secondary to chronic venous insufficiency with secondary lymphedema. Her situation is complicated by inability to get stockings on and an allergy to neoprene which is apparently a component and at least juxta lites and other stockings. As a result she really has not been wearing any stockings on her legs. She tells [place] that roughly 2 or 3 weeks ago she started noticing a stinging sensation just above her ankle on the left medial aspect. She has been diagnosed with pseudogout and she wondered whether this was what she was experiencing. She tried to dress this with something she bought at the store however subsequently it pulled skin off and now she has an open wound that is not improving. She has been using Vaseline gauze with a cover bandage. She saw her primary doctor last week who put an Haematologist on her. ABIs in this clinic was 1.03 on the left 2/12; the area is on the left medial ankle. Odd-looking wound with what looks to be surface epithelialization but a multitude of small petechial openings. This clearly not closed yet. We have been using silver alginate under 3 layer compression with TCA 2/19; the wound area did not look quite as good this week. Necrotic debris over the majority of the wound surface which required debridement. She continues to have a multitude of what looked to be small petechial  openings. She reminds [place] that she had a biopsy on this initially during her first outbreak in 2015 in [place] dermatology. She expresses concern about this being a possible melanoma. 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 I assured her that this does not  look anything like melanoma. Except for the petechial reaction it does look like a venous insufficiency area and she certainly has evidence of this on both sides 2/26; a difficult area on the left medial ankle. The patient clearly has chronic venous hypertension with some degree of lymphedema. The odd thing about the area is the small petechial hemorrhages. I am not really sure how to explain this. This was present last time and this is not a compression injury. We have been using Hydrofera Blue which I changed to last week 3/4; still using Hydrofera Blue. Aggressive debridement today. She does not have known arterial issues. She has seen Dr. Kellie Simmering at [facility] vein [facility] vascular and and has an ablation on the left. [Anterior accessory branch of the greater saphenous]. From what I remember they did not feel she had an arterial issue. The patient has had this area biopsied in 2009 at [facility] dermatology and by her recollection they said this was "stasis". She is also follow-up with dermatology locally who thought that this was more of a vascular issue 3/11; using Hydrofera Blue. Aggressive debridement today. She does not have an arterial issue. We are using 3 layer compression although we may need to go to 4. The patient has been in for multiple changes to her wrap since I last saw her a week ago. She says that the area was leaking. I do not have too much more information on what was found 01/19/19 on evaluation today patient was actually being seen for a nurse visit when unfortunately she had the area on her left lateral lower extremity as well as weeping from the right lower extremity that became apparent. Therefore we did end up actually seeing her for a full visit with myself. She is having some pain at this site as well but fortunately nothing too significant at this point. No fevers, chills, nausea, or vomiting noted at this time. 3/18-Patient is back to the clinic with the left leg venous leg  ulcer, the ulcer is larger in size, has a surface that is densely adherent with fibrinous tissue, the Hydrofera Blue was used but is densely adherent and there was difficulty in removing it. The right lower extremity was also wrapped for weeping edema. Patient has a new area over the left lateral foot above the malleolus that is small and appears to have no debris with intact surrounding skin. Patient is on increased dose of Lasix also as a means to edema management 3/25; the patient has a nonhealing venous ulcer on the medial left leg and last week developed a smaller area on the lateral left calf. We have been using Hydrofera Blue with a contact layer. 4/1; no major change in these wounds areas. Left medial and more recently left lateral calf. I tried Iodoflex last week to aid in debridement she did not tolerate this. She stated her pain was terrible all week. She took the top layer of the 4 layer compression off. 4/8; the patient actually looks somewhat better in terms of her more prominent left lateral calf wound. There is some healthy looking tissue here. She is still complaining of a lot of discomfort.  4/15; patient in a lot of pain secondary to sciatica. She is on a prednisone taper prescribed by her primary physician. She has the 2 areas one on the left medial and more recently a smaller area on the left lateral calf. Both of these just above the malleoli 4/22; her back pain is better but she still states she is very uncomfortable and now feels she is intolerant to the [employer]. No real change in the wounds we have been using Sorbact. She has been previously intolerant to Iodoflex. There is not a lot of option about what we can use to debride this wound under compression that she no doubt needs. sHe states Ultram no longer works for her pain 4/29; no major change in the wounds slightly increased depth. Surface on the original medial wound perhaps somewhat improved however the more recent  area on the lateral left ankle is 100% covered in very adherent debris we have been using Sorbact. She tolerates 4 layer compression well and her edema control is a lot better. She has not had to come in for a nurse check 5/6; no major change in the condition of the wounds. She did consent to debridement today which was done with some difficulty. Continuing Sorbact. She did not tolerate Iodoflex. She was in for a check of her compression the day after we wrapped her last week this was adjusted but RINKI, JURKOWSKI. ([id number]) nothing much was found 5/13; no major change in the condition or area of the wounds. I was able to get a fairly aggressive debridement done on the lateral left leg wound. Even using Sorbact under compression. She came back in on Friday to have the wrap changed. She says she felt uncomfortable on the lateral aspect of her ankle. She has a long history of chronic venous insufficiency including previous ablation surgery on this side. 5/20-Patient returns for wounds on left leg with both wounds covered in slough, with the lateral leg wound larger in size, she has been in 3 layer compression and felt more comfortable, she describes pain in ankle, in leg and pins and needles in foot, and is about to try Pamelor for this 6/3; wounds on the left lateral and left medial leg. The area medially which is the most recent of the 2 seems to have had the largest increase in dimensions. We have been using Sorbac to try and debride the surface. She has been to see orthopedics they apparently did a plain x-ray that was indeterminant. Diagnosed her with neuropathy and they have ordered an MRI to determine if there is underlying osteomyelitis. This was not high on my thought list but I suppose it is prudent. We have advised her to make an appointment with vein and vascular in [place]. She has a history of a left greater saphenous and accessory vein ablations I wonder if there is anything else that  can be done from a surgical point of view to help in these difficult refractory wounds. We have previously healed this wound on one occasion but it keeps on reopening [medial side] 6/10; deep tissue culture I did last week I think on the left medial wound showed both moderate E. coli and moderate staph aureus [MSSA]. She is going to require antibiotics and I have chosen Augmentin. We have been using Sorbact and we have made better looking wound surface on both sides but certainly no improvement in wound area. She was back in last Friday apparently for a dressing changes the wrap  was hurting her outer left ankle. She has not managed to get a hold of vein and vascular in [place]. We are going to have to make her that appointment 6/17; patient is tolerating the Augmentin. She had an MRI that I think was ordered by orthopedic surgeon this did not show osteomyelitis or an abscess did suggest cellulitis. We have been using Sorbact to the lateral and medial ankles. We have been trying to arrange a follow-up appointment with vein and vascular in [place] or did her original ablations. We apparently an area sent the request to vein and vascular in [facility] 6/24; patient has completed the Augmentin. We do not yet have a vein and vascular appointment in [place]. I am not sure what the issue is here we have asked her to call tomorrow. We are using Sorbact. Making some improvements and especially the medial wound. Both surfaces however look better medial and lateral. 7/1; the patient has been in contact with vein and vascular in [place] but has not yet received an appointment. Using Sorbact we have gradually improve the wound surface with no improvement in surface area. She is approved for Apligraf but the wound surface still is not completely viable. She has not had to come in for a dressing change 7/8; the patient has an appointment with vein and vascular on 7/31 which is a Friday afternoon. She is  concerned about getting back here for [place] to dress her wounds. I think it is important to have them goal for her venous reflux/history of ablations etc. to see if anything else can be done. She apparently tested positive for 1 of the blood tests with regards to lupus and saw a rheumatologist. He has raised the issue of vasculitis again. I have had this thought in the past however the evidence seems overwhelming that this is a venous reflux etiology. If the rheumatologist tells me there is clinical and laboratory investigation is positive for lupus I will rethink this. 7/15; the patient's wound surfaces are quite a bit better. The medial area which was her original wound now has no depth although the lateral wound which was the more recent area actually appears larger. Both with viable surfaces which is indeed better. Using Sorbact. I wanted to use Apligraf on her however there is the issue of the vein and vascular appointment on 7/31 at 2:00 in the afternoon which would not allow her to get back to be rewrapped and they would no doubt remove the graft 7/22; the patient's wound surfaces have moderate amount of debris although generally look better. The lateral one is larger with 2 small satellite areas superiorly. We are waiting for her vein and vascular appointment on 7/31. She has been approved for Apligraf which I would like to use after th 7/29; wound surfaces have improved no debridement is required we have been using Sorbact. She sees vein and vascular on Friday with this so question of whether anything can be done to lessen the likelihood of recurrence and/or speed the healing of these areas. She is already had previous ablations. She no doubt has severe venous hypertension 8/5-Patient returns at 1 week, she was in [place] for 3 days by her podiatrist, we have been using so backed to the wound, she has increased pain in both the wounds on the left lower leg especially the more distal one on  the lateral aspect 8/12-Patient returns at 1 week and she is agreeable to having debridement in both wounds on her left leg  today. We have been using Sorbact, and vascular studies were reviewed at last visit 8/19; the patient arrives with her wounds fairly clean and no debridement is required. We have used Sorbact which is really done a nice job in cleaning up these very difficult wound surfaces. The patient saw Dr. Donzetta Matters of vascular surgery on 7/31. He did not feel that there was an arterial component. He felt that her treated greater saphenous vein is adequately addressed and that the small saphenous vein did not appear to be involved significantly. She was also noted to have deep venous reflux which is not treatable. Dr. Donzetta Matters mentioned the possibility of a central obstructive component leading to reflux and he offered her central venography. She wanted to discuss this or think about it. I have urged her to go ahead with this. She has had recurrent difficult wounds in these areas which do heal but after months in the clinic. If there is anything that can be done to reduce the likelihood of this I think it is worth it. 9/2 she is still working towards getting follow-up with Dr. Donzetta Matters to schedule her CT. Things are quite a bit worse venography. I put Apligraf on 2 weeks ago on both wounds on the medial and lateral part of her left lower leg. She arrives in clinic today with 3 superficial additional wounds above the area laterally and one below the wound medially. She describes a lot of discomfort. I think these are probably wrapped injuries. Does not look like she has cellulitis. 07/20/2019 on evaluation today patient appears to be doing somewhat poorly in regard to her lower extremity ulcers. She in fact showed signs of erythema in fact we may even be dealing with an infection at this time. Unfortunately I am unsure if this is just infection or if indeed there may be some allergic reaction that  occurred as a result of the Apligraf application. With that being said that would be unusual but nonetheless not impossible in this patient is one who is unfortunately allergic to quite a bit. Currently we have been using the Sorbact which seems to do as well as anything for her. I do think we may want to obtain a culture today to see if there is anything showing up there that may need to be addressed. 9/16; noted that last week the wounds look worse in 1 week follow-up of the Apligraf. Using Sorbact as of 2 days ago. She arrives with copious amounts of drainage and new skin breakdown on the back of the left calf. The wounds arm more substantial bilaterally. There is a fair amount of swelling in the left calf no overt DVT there is edema present I think in the left greater than right thigh. She is supposed to go on 9/28 for CT venography. The wounds on the medial and lateral calf are worse and she has new skin breakdown posteriorly at least new for me. This is almost developing into a circumferential wound area The Apligraf was taken off last week which I agree with things are not going in the right direction a culture was done we do not have that back yet. She is on Augmentin that she started 2 days ago 9/23; dressing was changed by her nurses on Monday. In general there is no improvement in the wound areas although the area looks less angry than last week. She did get Augmentin for MSSA cultured on the 14th. She still appears to have too much swelling in the left  leg even with 3 layer compression 9/30; the patient underwent her procedure on 9/28 by Dr. Donzetta Matters at vascular and vein specialist. She was discovered to have the common iliac vein measuring 12.2 mm but at the level of L4-L5 measured 3 mm. After stenting it measured 10 mm. 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 Rouleaux flow in the common femoral and femoral vein was observed much improved after stenting. We are using silver  alginate to the wounds on the medial and lateral ankle on the left. 4 layer compression SARY, FISHBACK ([id number]) 10/7; the patient had fluid swelling around her knee and 4 layer compression. At the advice of vein and vascular this was reduced to 3 layer which she is tolerating better. We have been using silver alginate under 3 layer compression since last Friday 10/14; arrives with the areas on the left ankle looking a lot better. Inflammation in the area also a lot better. She came in for a nurse check on 10/9 10/21; continued nice improvement. Slight improvements in surface area of both the medial and lateral wounds on the left. A lot of the satellite lesions in the weeping erythema around these from stasis dermatitis is resolved. We have been using silver alginate 10/28; general improvement in the entire wound areas although not a lot of change in dimensions the wound certainly looks better. There is a lot less in terms of venous inflammation. Continue silver alginate this week however look towards Hydrofera Blue next week 11/4; very adherent debris on the medial wound left wound is not as bad. We have been using silver alginate. Change to [facility] today 11/11; very adherent debris on both wound areas. She went to vein and vascular last week and follow-up they put in [place] boot on this today. He says the [facility] was adherent. Wound is definitely not as good as last week. Especially on the left there the satellite lesions look more prominent 11/18; absolutely no better. erythema on lateral aspect with tenderness. 09/30/2019 on evaluation today patient appears to actually be doing better. Dr. Dellia Nims did put her on doxycycline last week which I do believe has helped her at this point. Fortunately there is no signs of active infection at this time. No fevers, chills, nausea, vomiting, or diarrhea. I do believe he may want extend the doxycycline for 7 additional days just to ensure  everything does completely cleared up the patient is in agreement with that plan. Otherwise she is going require some sharp debridement today 12/2; patient is completing a 2-week course of doxycycline. I gave her this empirically for inflammation as well as infection when I last saw her 2 weeks ago. All of this seems to be better. She is using silver alginate she has the area on the medial aspect of the larger area laterally and the 2 small satellite regions laterally above the major wound. 12/9; the patient's wound on the left medial and left lateral calf look really quite good. We have been using silver alginate. She saw vein and vascular in follow-up on 10/09/2019. She has had a previous left greater saphenous vein ablation by Dr. Oscar La in 2016. More recently she underwent a left common iliac vein stent by Dr. 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 The swelling is improved and certainly the wounds have improved. The patient shows [place] today area on the right medial calf there is almost no wound but leaking lymphedema. She says she start this started 3  or 4 days ago. She did not traumatize it. It is not painful. She does not wear compression on that side 12/16; the patient continues to do well laterally. Medially still requiring debridement. The area on the right calf did not materialize to anything and is not currently open. We wrapped this last time. She has support stockings for that leg although I am not sure they are going to provide adequate compression 12/23; the lateral wound looks stable. Medially still requiring debridement for tightly adherent fibrinous debris. We've been using silver alginate. Surface area not any different 12/30; neither wound is any better with regards to surface and the area on the left lateral is larger. I been using silver alginate to the left lateral which look quite good last week and Sorbact to the left medial 11/11/2019. Lateral wound area actually  looks better and somewhat smaller. Medial still requires a very aggressive debridement today. We have been using Sorbact on both wound areas 1/13; not much better still adherent debris bilaterally. I been using Sorbact. She has severe venous hypertension. Probably some degree of dermal fibrosis distally. I wonder whether tighter compression might help and I am going to try that today. We also need to work on the bioburden 1/20; using Sorbact. She has severe venous hypertension status post stent placement for pelvic vein compression. We applied gentamicin last time to see if we could reduce bioburden I had some discussion with her today about the use of pentoxifylline. This is occasionally used in this setting for wounds with refractory venous insufficiency. However this interacts with Plavix. She tells me that she was put on this after stent placement for 3 months. She will call Dr. Claretha Cooper office to discuss 1/27; we are using gentamicin under Sorbact. 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 She has a stent. Wound medially is measuring smaller this week. Laterally measuring slightly larger although she has some satellite lesions superiorly 2/3; gentamicin under Sorbact under 4-layer compression. 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 She has a stent on Plavix. Her wounds are measuring smaller this week. More substantially laterally where there is a satellite lesion superiorly. 2/10; gentamicin under Sorbac. 4-layer compression. Patient communicated with Dr. Donzetta Matters at vein and vascular in [place]. He is okay with the patient coming off Plavix I will therefore start her on pentoxifylline for a 1 month trial. In general her wounds look better today. I had some concerns about swelling in the left thigh however she measures 61.5 on the right and 63 on the mid thigh which does not suggest there is any difficulty. The patient is not describing any  pain. 2/17; gentamicin under Sorbac 4-layer compression. She has been on pentoxifylline for 1 week and complains of loose stool. No nausea she is eating and drinking well 2/24; the patient apparently came in 2 days ago for a nurse visit when her wrap fell down. Both areas look a little worse this week macerated medially and satellite lesions laterally. Change to silver alginate today 3/3; wounds are larger today especially medially. She also has more swelling in her foot lower leg and I even noted some swelling in her posterior thigh which is tender. I wonder about the patency of her stent. Fortuitously she sees Dr. Claretha Cooper group on Friday 3/10; Mrs. Chana was seen by vein and vascular on 3/5. The patient underwent ultrasound. There was no evidence of thrombosis involving the IVC no evidence of thrombosis involving the right common iliac vein there  is no evidence of thrombosis involving the right external iliac vein the left external vein is also patent. The right common iliac vein stent appears patent bilateral common femoral veins are compressible and appear patent. I was concerned about the left common iliac stent however it looks like this is functional. She has some edema in the posterior thigh that was tender she still has that this week. I also note they had trouble finding the pulses in her left foot and booked her for an ABI baseline in 4 weeks. She will follow up in 6 months for repeat IVC duplex. The patient stopped the pentoxifylline because of diarrhea. It does not look like that was being effective in any case. I have advised her to go back on her aspirin 81 mg tablet, vascular it also suggested this 3/17; comes in today with her wound surfaces a lot better. The excoriations from last week considerably better probably secondary to the TCA. We have been using silver alginate 3/24; comes in today with smaller wounds both medially and laterally. Both required debridement. There are 2  small satellite areas superiorly laterally. She also has a very odd bandlike area in the mid calf almost looking like there was a weakness in the wrap in a localized area. I would write this off as being this however anteriorly she has a small raised ballotable area that is very tender almost reminiscent of an abscess but there was no GWENDOLA, POLLAND. ([id number]) obvious purulent surface to it. 02/04/20 upon evaluation today patient appears to be doing fairly well in regard to her wounds today. Fortunately there is no signs of active infection at this time. No fevers, chills, nausea, vomiting, or diarrhea. She has been tolerating the dressing changes without complication. Fortunately I feel like she is showing signs of improvement although has been sometime since have seen her. Nonetheless the area of concern that Dr. Dellia Nims had last week where she had possibly an area of the wrap that was we can allow the leg to bulge appears to be doing significantly better today there is no signs of anything worsening. 4/7; the patient's wounds on her medial and lateral left leg continue to contract. We have been using a regular alginate. Last week she developed an area on the right medial lower leg which is probably a venous ulcer as well. 4/14; the wounds on her left medial and lateral lower leg continue to contract. Surface eschar. We have been using regular alginate. The area on the right medial lower leg is closed. We have been putting both legs under 4-layer contraction. The patient went back to see vein and vascular she had arterial studies done which were apparently "quite good" per the patient although I have not read their notes I have never felt she had an arterial issue. 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 This is been longstanding and refractory to exercise, leg elevation and longstanding use of compression wraps in our clinic as well as compression  stockings on the times we have been able to get these to heal 4/21; we thought she actually might be close this week however she arrives in clinic with a lot of edema in her upper left calf and into her posterior thigh. This is been an intermittent problem here. She says the wrap fell down but it was replaced with a nurse visit on Monday. We are using calcium alginate to the wounds and the wound sizes there not terribly larger than  last week but there is a lot more edema 4/28; again wound edges are smaller on both sides. Her edema is better controlled than last time. She is obtained her compression pumps from [employer] although they have not been to her home to set these up. 5/5; left medial and left lateral both look stable. I am not sure the medial is any smaller. We have been using calcium alginate under 4-layer compression. She had an area on the right medial. This was eschared today. We have been wrapping this as well. She does not tolerate external compression stockings due to a history of various contact allergies. She has her compression pumps however the representative from the company is coming on her to show her how to use these tomorrow 5/19; patient with severe chronic venous insufficiency secondary to central venous disease. She had a stent placed in her left common iliac vein. She has done better since but still difficult to control wounds. She comes in today with nothing open on the right leg. Her areas on the left medial and left lateral are just about closed. We are using calcium alginate under 4-layer compression. She is using her external compression pumps at home She only has 15-20 support stockings. States she cannot get anything tighter than that on. 03/30/20-Patient returns at 1 week, the wounds on the left leg are both slightly bigger, the last week she was on 3 layer compression which started to slide down. She is starting to use her lymphedema pumps although she  stated on 1 day her right ankle started to swell up and she have to stop that day. Unfortunately the open area seem to oscillate between improving to the point of healing and then flaring up all to do with effectiveness of compression or lack of due to the left leg topography not keeping the compression wraps from rolling down 6/2 patient comes in with a 15/20 mmHg stocking on the right leg. She tells me that she developed a lot of swelling in her ankles she saw orthopedics she was felt to possibly be having a flare of pseudogout versus some other type of arthritis. She was put on steroids for a respiratory issue so that helps with the inflammation. She has not been using the pumps all week. She thinks the left thigh is more swollen than usual and I would agree with that. She has an appointment with Dr. Donzetta Matters 9 days or so from now 6/9; both wounds on the left medial and left lateral are smaller. We have been using calcium alginate under compression. She does not have an open wound on the right leg she is using a stocking and her compression pumps things are going well. She has an appointment with Dr. Donzetta Matters with regards to her stent in the left common iliac vein 6/16; the wounds on the left medial and left lateral ankle continues to contract. The patient saw Dr. Donzetta Matters and I think he seems satisfied. Ordered follow-up venous reflux studies on both sides in September. Cautioned that she may need thigh-high stockings. She has been using calcium alginate under compression on the left and her own stocking on the right leg. She tells [place] there are no open wounds on the right 6/23; left lateral is just about closed. Medial required debridement today. We have been using calcium alginate. Extensive discussion about the compression pumps she is only using these on 25 mmHg states she could not take 40 or 30 when the wrap came out to her  home to demonstrate these. He said they should not feel tight 6/30; the left  lateral wound has a slight amount of eschar. . The area medially is about the same using Hydrofera Blue. 7/7; left lateral wound still has some eschar. I will remove this next week may be closed. The area medially is very small using Hydrofera Blue with improvement. Unfortunately the stockings fell down. Unfortunately the blisters have developed at the edge of where the wrap fell. When this happened she says her legs hurt she did not use her pumps. We are not open Monday for her to come in and change the wraps and she had an appointment yesterday. She also tells me that she is going to have an MRI of her back. She is having pain radiating into her left anterior leg she thinks her from an L5 disc. She saw Dr. Ellene Route of neurosurgery 7/14; the area on the left lateral ankle area is closed. Still a small area medially however it looks better as well. We have been using Hydrofera Blue under 4-layer compression 7/21; left lateral ankle is still closed however her wound on the medial left calf is actually larger. This is probably because Hydrofera Blue got stuck to the wound. She came in for a nurse change on Friday and will do that again this week I was concerned about the amount of swelling that she had last week however she is using her compression pumps twice a day and the swelling seems well controlled 7/28; remaining wound on the left medial lower leg is smaller. We have been using moistened silver collagen under compression she is coming back for a nurse visit. For reasons that were not really clear she was just keeping her legs elevated and not using her compression pumps. I have asked her to use the compression pumps. She does not have any wounds on the right leg 06/15/20-Patient returns at 2 weeks, her LLE edema is worse and she developed a blister wound that is new and has bigger posterior calf wound on right, we are using Prisma with pad, 4 layer compression. she has been on lasix 40 mg  daily 8/18; patient arrives today with things a lot worse than I remember from a few weeks ago. She was seen last week. Noted that her edema was worse and that she now had a left lateral wound as well as deteriorating edema in the medial and posterior part of the lower leg. She says she is using his or her external compression pumps once a day although I wonder about the compliance. 8/25; weeping area on the right medial lower leg. This had actually gotten a small localized area of her compression stocking wet. On the left side there is a large denuded area on the posterior medial lower leg and smaller area on the lateral. This was not the original areas that we dealt with. 9/1 the patient's wound on the left leg include the left lateral and left posterior. Larger superficial wounds weeping. She has very poor edema control. Tender localized edema in the left lower medial ankle/heel probably because of localized wrap issues. She freely admits she is not using Gashi, Galateo ([id number]) the compression pumps. She has been up on her feet a lot. She thinks the hydrofera blue is contributing to the pain she is experiencing.. This is a complaint that I have occasionally heard 9/8; really not much improvement. The patient is still complaining of a lot of pain particularly when she uses compression  pumps. I switched her to silver alginate last time because she found the Hydrofera Blue to be irritating. I don't hear much difference in her description with the silver alginate. 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 She has follow-up with Dr. Donzetta Matters I think for ultrasound next month 9/15; quite a bit of improvement today. We have less edema and more epithelialization in both of her wound areas on the left medial and left lateral calf. These are not the site of her original wounds in this area. She says she has been using her  compression pumps for 30 minutes twice a day, there pain issues that never quite understood. Silver alginate as the primary dressing 9/22; continued improvement. Both areas medially and laterally still have a small open area there is some eschar. She continues to complain of left medial ankle pain. Swelling in the leg is in much better condition. We have been using silver alginate 9/29; continued improvement. Both areas medially and laterally in the left calf look as though they are close some minor surface eschar but I think this is epithelialized. She comes in today saying she has a ruptured disc at L4-L5 cannot bend over to put on her stockings. 10/6; patient comes in today with no open wounds on either leg. However her edema on the left leg in the upper one third of the lower leg is poorly controlled nonpitting. She says that she could not use the pumps for 2 days and then she has been using the last couple of days. It is not clear to me she has been able to get her stocking on. She has back problems. Mrs. Altemus has severe chronic venous insufficiency with secondary lymphedema. Her venous insufficiency is partially centrally mediated and that she is now post stent in the left common iliac vein. [facility] Thurner's syndrome/physiology]. She follows up with them on 10/15. She wears 20/30 below-knee stockings. She is supposed to use compression pumps at home although I think her compliance about with this is been less than 100%. I have asked her to use these 3 times a day. Finally I think she has lipodermatosclerosis in the left lower leg with an inverted bottle sign. It is been a major problem controlling the edema in the left leg. The right leg we have had wounds on but not as significant a problem is on the left READMISSION 04/12/2021 Mrs. Sonne is a 75 year old woman we know well in this clinic. She has severe chronic venous insufficiency. 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 I believe she has had bilateral greater saphenous vein ablation in the past as well. She tells me that this wound opened sometime in March. She had a fall and thinks it was initially abrasion. She developed areas she describes as little blisters on the anterior part of her leg and she saw dermatology and was treated for methicillin staph aureus with several rounds of antibiotics. She has been using support stockings on the left leg and says this is the only thing she can get on. Her compression pump use maybe once a day she says if she did not use one she use the other. She comes in today with incredible swelling in the left leg with a wound on the left posterior calf. She has been using Neosporin to this previously a hydrocolloid. 6/15; patient arrives back  for 1 week follow-up.Marland Kitchen Apparently her wrap fell down she did not call us to replace this. He has poor edema control. She only uses her compression pumps once a day 6/29; patient presents for 1 week follow-up. She has tolerated the compression wrap well. We have been using Iodoflex under the wrap. She has no issues or complaints today. 7/6; patient presents for 1 week follow-up. She states that the compression wrap rolled down her leg 4 days ago. She has been trying to keep the area covered but has no dressings at home to use. She denies signs of infection. 7/13; patient presents for 1 week follow-up. She states that her compression wrap rolled down her right leg and she called our office and had it placed a few days ago. She has been tolerating the current wrap well. She states that the Iodoflex is causing a burning sensation. She denies signs of infection. 7/27; patient presents for 1 week follow-up She has tolerated the compression wrap well with Hydrofera Blue underneath. She has now developed a wound to her right lower extremity. She reports having a culture done To this area by her dermatologist that she reports is  negative. She currently denies signs of infection. 8/30; patient presents for 1 week follow-up. On the left side she has tolerated the compression wrap well with Hydrofera Blue underneath. She reports some discomfort to her right lower extremity with the 3 layer compression. She currently denies signs of infection. 06/14/2021 upon evaluation today patient's wound actually appears to be doing decently well based on what I am seeing currently. She actually has 2 areas 1 on the left distal/posterior lower leg and the other on the right lower leg. Subsequently the measurements are roughly the same may be slightly smaller but in general have not made any trend towards getting worse which is great news. 06/23/2021 upon evaluation today patient appears to be doing pretty well in regard to her wounds. On the right she is having difficulty with sciatic pain and this subsequently has led to her taking the wrap off over the last week her leg is more swollen she is also been on prednisone for 10 days. With that being said I think we may want to just use a compression sock on the left that way she will not have to worry about the compression wrap being in place that she cannot get off. With that being said I do believe as well that the patient is going to need to continue with the wrapping on the left we will also need to do a little bit of sharp debridement today. Objective Constitutional [name] and well-hydrated in no acute distress. Vitals Time Taken: 1:56 PM, Height: 63 in, Weight: 212 lbs, BMI: 37.6, Temperature: 98.5 F, Pulse: 62 bpm, Respiratory Rate: 16 breaths/min, Loeffelholz, Victoriya J. ([id number]) Blood Pressure: 154/82 mmHg. Respiratory normal breathing without difficulty. Psychiatric this patient is able to make decisions and demonstrates good insight into disease process. Alert and Oriented x 3. pleasant and cooperative. General Notes: Upon inspection patient's wound bed actually showed  signs of good granulation epithelization at this point. Fortunately there does not appear to be any evidence of active infection which is great news and overall very pleased with where things stand today. No fevers, chills, nausea, vomiting, or diarrhea. Integumentary (Hair, Skin) Wound #13 status is Open. Original cause of wound was Gradually Appeared. The date acquired was: 02/24/2021. The wound has been in treatment 10 weeks. The wound is located on the [employer]  Lower Leg. The wound measures 1.7cm length x 1.7cm width x 0.2cm depth; 2.27cm^2 area and 0.454cm^3 volume. There is a medium amount of serosanguineous drainage noted. Wound #14 status is Open. Original cause of wound was Gradually Appeared. The date acquired was: 05/05/2021. The wound has been in treatment 3 weeks. The wound is located on the Right Lower Leg. The wound measures 2cm length x 2cm width x 0.1cm depth; 3.142cm^2 area and 0.314cm^3 volume. There is a medium amount of serosanguineous drainage noted. Assessment Active Problems ICD-10 Chronic venous hypertension (idiopathic) with ulcer and inflammation of left lower extremity Lymphedema, not elsewhere classified Non-pressure chronic ulcer of left calf limited to breakdown of skin Unspecified open wound, right lower leg, initial encounter Procedures Wound #13 Pre-procedure diagnosis of Wound #13 is a Venous Leg Ulcer located on the Left,Distal,Posterior Lower Leg .Severity of Tissue Pre Debridement is: Fat layer exposed. There was a Excisional Skin/Subcutaneous Tissue Debridement with a total area of 2.89 sq cm performed by Tommie Sams., PA-C. With the following instrument(s): Curette to remove Viable and Non-Viable tissue/material. Material removed includes Subcutaneous Tissue, Slough, and Biofilm. No specimens were taken. A time out was conducted at 13:59, prior to the start of the procedure. A Minimum amount of bleeding was controlled with Pressure. The  procedure was tolerated well. Post Debridement Measurements: 1.7cm length x 1.7cm width x 0.2cm depth; 0.454cm^3 volume. Character of Wound/Ulcer Post Debridement is stable. Severity of Tissue Post Debridement is: Fat layer exposed. Post procedure Diagnosis Wound #13: Same as Pre-Procedure Plan Follow-up Appointments: Wound #13 Left,Distal,Posterior Lower Leg: Return Appointment in 1 week. Nurse Visit as needed Bathing/ Shower/ Hygiene: May shower with wound dressing protected with water repellent cover or cast protector. Anesthetic (Use 'Patient Medications' Section for Anesthetic Order Entry): Lidocaine applied to wound bed Edema Control - Lymphedema / Segmental Compressive Device / Other: Elevate, Exercise Daily and Avoid Standing for Long Periods of Time. Elevate legs to the level of the heart and pump ankles as often as possible Elevate leg(s) parallel to the floor when sitting. Compression Pump: Use compression pump on left lower extremity for 60 minutes, twice daily. - TWICE DAILY for 1 hour Compression Pump: Use compression pump on right lower extremity for 60 minutes, twice daily. - TWICE DAILY for 1 hour WOUND #13: - Lower Leg Wound Laterality: Left, Posterior, Distal Bittinger, Sheronda J. ([id number]) Cleanser: Soap and Water 1 x Per Week/30 Days Discharge Instructions: Gently cleanse wound with antibacterial soap, rinse and pat dry prior to dressing wounds Topical: Triamcinolone Acetonide Cream, 0.1%, 15 (g) tube 1 x Per Week/30 Days Discharge Instructions: Peri wound Apply as directed by provider. Primary Dressing: Prisma 4.34 (in) 1 x Per Week/30 Days Discharge Instructions: Moisten w/normal saline or sterile water; Cover wound as directed. Do not remove from wound bed. Secondary Dressing: ABD Pad 5x9 (in/in) 1 x Per Week/30 Days Discharge Instructions: Cover with ABD pad Compression Wrap: Medichoice 4 layer Compression System, 35-40 mmHG 1 x Per Week/30 Days Discharge  Instructions: Apply multi-layer wrap as directed. WOUND #14: - Lower Leg Wound Laterality: Right Cleanser: Soap and Water 1 x Per Week/30 Days Discharge Instructions: Gently cleanse wound with antibacterial soap, rinse and pat dry prior to dressing wounds Topical: Triamcinolone Acetonide Cream, 0.1%, 15 (g) tube 1 x Per Week/30 Days Discharge Instructions: Peri wound Apply as directed by provider. Secondary Dressing: ABD Pad 5x9 (in/in) 1 x Per Week/30 Days Discharge Instructions: Cover with ABD pad Secondary Dressing: Non-Adherent Pad 3x8 (in/in)  1 x Per Week/30 Days Compression Wrap: Profore Lite LF 3 Multilayer Compression Bandaging System 1 x Per Week/30 Days Discharge Instructions: Apply 3 multi-layer wrap as prescribed. 1. I would recommend currently that we going continue with the wound care measures as before and the patient is in agreement the plan. This includes the use of the silver collagen which I think has been beneficial for her. This is on the left leg. On the right leg we have been using the triamcinolone. 2. I am also can recommend that we continue with a 3 layer compression wrap on the left leg which again I do believe has done a good job. On the right she will be using a compression stocking. We will see patient back for reevaluation in 1 week here in the clinic. If anything worsens or changes patient will contact our office for additional recommendations. Electronic Signature(s) Signed: 06/23/2021 5:35:59 PM By: Worthy Keeler PA-C Entered By: Worthy Keeler on 06/23/2021 17:35:59 Balinski, [name] ([id number]) -------------------------------------------------------------------------------- SuperBill Details Patient Name: DELESA, GREIFF. Date of Service: 06/23/2021 Medical Record Number: [id number] Patient Account Number: [id number] Date of Birth/Sex: 1946/03/24 (75 y.o. F) Treating RN: Cornell Barman Primary Care Provider: [name] Other Clinician: Referring  Provider: [name] Treating Provider/Extender: [name] in Treatment: 10 Diagnosis Coding ICD-10 Codes Code Description [id number] Chronic venous hypertension (idiopathic) with ulcer and inflammation of left lower extremity I89.0 Lymphedema, not elsewhere classified L97.221 Non-pressure chronic ulcer of left calf limited to breakdown of skin S81.801A Unspecified open wound, right lower leg, initial encounter Facility Procedures CPT4 Code: [id number] Description: [id number] - DEB SUBQ TISSUE 20 SQ CM/< Modifier: Quantity: 1 CPT4 Code: Description: ICD-10 Diagnosis Description L97.221 Non-pressure chronic ulcer of left calf limited to breakdown of skin Modifier: Quantity: Physician Procedures CPT4 Code: [id number] Description: 11042 - WC PHYS SUBQ TISS 20 SQ CM Modifier: Quantity: 1 CPT4 Code: Description: ICD-10 Diagnosis Description L97.221 Non-pressure chronic ulcer of left calf limited to breakdown of skin Modifier: Quantity: Electronic Signature(s) Signed: 06/23/2021 5:36:22 PM By: Worthy Keeler PA-C Entered By: Worthy Keeler on 06/23/2021 17:36:21

## 2021-06-23 NOTE — Telephone Encounter (Signed)
Per appt notes pt has appt 06/23/21 at 3PM with Dr Darnell Level. Sending note to DR Baldwin Crown CMA.

## 2021-06-23 NOTE — Telephone Encounter (Signed)
Noted.  FYI to Dr. G. 

## 2021-06-23 NOTE — Progress Notes (Signed)
[name], [name] Visit Report for 06/23/2021 Arrival Information Details Patient Name: [name], [name]. Date of Service: 06/23/2021 1:45 PM Medical Record Number: [id number] Patient Account Number: [id number] Date of Birth/Sex: [birth year] (75 y.o. F) Treating RN: Cornell Barman Primary Care Linsey Hirota: [name] Other Clinician: Referring Hanad Leino: [name] Treating Dequavion Follette/Extender: [name] in Treatment: 10 Visit Information History Since Last Visit Added or deleted any medications: No Patient Arrived: Ambulatory Has Dressing in Place as Prescribed: Yes Arrival Time: 13:56 Pain Present Now: No Accompanied By: self Transfer Assistance: None Patient Identification Verified: Yes Secondary Verification Process Completed: Yes Patient Requires Transmission-Based No Precautions: Patient Has Alerts: Yes Patient Alerts: Patient on Blood Thinner ASPIRIN NOT diabetic Electronic Signature(s) Signed: 06/23/2021 3:25:01 PM By: [name], [name], RN, CWS, [name], [name] Entered By: [name], [name], RN, CWS, Kim on 06/23/2021 13:56:30 [name], [name] -------------------------------------------------------------------------------- Encounter Discharge Information Details Patient Name: [name], [name]. Date of Service: 06/23/2021 1:45 PM Medical Record Number: [id number] Patient Account Number: [id number] Date of Birth/Sex: [birth year] (75 y.o. F) Treating RN: Cornell Barman Primary Care Doris Gruhn: [name] Other Clinician: Referring Ryler Laskowski: [name] Treating Kenise Barraco/Extender: [name] in Treatment: 10 Encounter Discharge Information Items Post Procedure Vitals Discharge Condition: Stable Unable to obtain vitals Reason: limited time Ambulatory Status: Wheelchair Discharge Destination: Home Transportation: Private Auto Accompanied By: self Schedule Follow-up Appointment: Yes Clinical Summary of Care: Electronic Signature(s) Signed:  06/23/2021 3:25:01 PM By: [name], [name], RN, CWS, [name], [name] Entered By: [name], [name], RN, CWS, Kim on 06/23/2021 14:25:10 [name] -------------------------------------------------------------------------------- Lower Extremity Assessment Details Patient Name: [name], [name]. Date of Service: 06/23/2021 1:45 PM Medical Record Number: [id number] Patient Account Number: [id number] Date of Birth/Sex: 1946/03/20 (75 y.o. F) Treating RN: Cornell Barman Primary Care Gwyn Hieronymus: [name] Other Clinician: Referring Kamali Nephew: [name] Treating Maxamillion Banas/Extender: Jeri Cos Weeks in Treatment: 10 Edema Assessment Assessed: [name]: Yes] [Right: Yes] Edema: [Left: Yes] [Right: Yes] Calf Left: Right: Point of Measurement: 32 cm From Medial Instep 48 cm 43 cm Ankle Left: Right: Point of Measurement: 10 cm From Medial Instep 22 cm 22.5 cm Vascular Assessment Pulses: Dorsalis Pedis Palpable: [Left:Yes] [Right:Yes] Electronic Signature(s) Signed: 06/23/2021 3:25:01 PM By: [name], [name], RN, CWS, [name], [name] Entered By: [name], [name], RN, CWS, Kim on 06/23/2021 13:58:24 Hail, [name] -------------------------------------------------------------------------------- Multi Wound Chart Details Patient Name: [name], [name]. Date of Service: 06/23/2021 1:45 PM Medical Record Number: [id number] Patient Account Number: [id number] Date of Birth/Sex: [birth year] (75 y.o. F) Treating RN: Cornell Barman Primary Care Harland Aguiniga: [name] Other Clinician: Referring Deardra Hinkley: [name] Treating Kwane Rohl/Extender: [name] in Treatment: 10 Vital Signs Height(in): 63 Pulse(bpm): 73 Weight(lbs): 212 Blood Pressure(mmHg): 154/82 Body Mass Index(BMI): 38 Temperature(F): 98.5 Respiratory Rate(breaths/min): 16 Photos: [13:No Photos] [14:No Photos] [N/A:N/A] Wound Location: [13:Left, Distal, Posterior Lower Leg] [14:Right Lower Leg] [N/A:N/A] Wounding  Event: [13:Gradually Appeared] [14:Gradually Appeared] [N/A:N/A] Primary Etiology: [13:Venous Leg Ulcer] [14:Venous Leg Ulcer] [N/A:N/A] Date Acquired: [13:02/24/2021] [14:05/05/2021] [N/A:N/A] Weeks of Treatment: [13:10] [14:3] [N/A:N/A] Wound Status: [13:Open] [14:Open] [N/A:N/A] Measurements L x W x D (cm) [13:1.7x1.7x0.2] [14:2x2x0.1] [N/A:N/A] Area (cm) : [13:2.27] [14:3.142] [N/A:N/A] Volume (cm) : [13:0.454] [14:0.314] [N/A:N/A] % Reduction in Area: [13:61.50%] [14:83.80%] [N/A:N/A] % Reduction in Volume: [13:22.90%] [14:83.80%] [N/A:N/A] Classification: [13:Full Thickness Without Exposed Support Structures] [14:Partial Thickness] [N/A:N/A] Exudate Amount: [13:Medium] [14:Medium] [N/A:N/A] Exudate Type: [13:Serosanguineous red, brown] [14:Serosanguineous red, brown] [N/A:N/A N/A] Treatment Notes Electronic Signature(s) Signed: 06/23/2021 3:25:01 PM By: [name], [name], RN, CWS,  Maudie Mercury RN, [name] Entered By: [name], [name], RN, CWS, Kim on 06/23/2021 13:59:22 Ormand, [name] -------------------------------------------------------------------------------- Multi-Disciplinary Care Plan Details Patient Name: [name], [name]. Date of Service: 06/23/2021 1:45 PM Medical Record Number: [id number] Patient Account Number: [id number] Date of Birth/Sex: [birth year] (75 y.o. F) Treating RN: Cornell Barman Primary Care Meng Winterton: [name] Other Clinician: Referring Aeon Kessner: [name] Treating Nikeya Maxim/Extender: [name] in Treatment: 10 Active Inactive Venous Leg Ulcer Nursing Diagnoses: Actual venous Insuffiency (use after diagnosis is confirmed) Knowledge deficit related to disease process and management Potential for venous Insuffiency (use before diagnosis confirmed) Goals: Patient will maintain optimal edema control Date Initiated: 04/12/2021 Target Resolution Date: 05/04/2021 Goal Status: Active Interventions: Assess peripheral edema status every visit. Compression  as ordered Provide education on venous insufficiency Treatment Activities: Therapeutic compression applied : 04/12/2021 Notes: Electronic Signature(s) Signed: 06/23/2021 3:25:01 PM By: [name], [name], RN, CWS, [name], [name] Entered By: [name], [name], RN, CWS, Kim on 06/23/2021 13:59:11 [name], [name] -------------------------------------------------------------------------------- Pain Assessment Details Patient Name: [name], [name]. Date of Service: 06/23/2021 1:45 PM Medical Record Number: [id number] Patient Account Number: [id number] Date of Birth/Sex: March 01, 1946 (75 y.o. F) Treating RN: Cornell Barman Primary Care Jema Deegan: [name] Other Clinician: Referring Zakiah Beckerman: [name] Treating Jarett Dralle/Extender: [name] in Treatment: 10 Active Problems Location of Pain Severity and Description of Pain Patient Has Paino Yes Site Locations Pain Location: Pain in Ulcers With Dressing Change: Yes Pain Management and Medication Current Pain Management: Electronic Signature(s) Signed: 06/23/2021 3:25:01 PM By: [name], [name], RN, CWS, [name], [name] Entered By: [name], [name], RN, CWS, Kim on 06/23/2021 13:57:09 Printz, [name] -------------------------------------------------------------------------------- Patient/Caregiver Education Details Patient Name: [name], [name]. Date of Service: 06/23/2021 1:45 PM Medical Record Number: [id number] Patient Account Number: [id number] Date of Birth/Gender: [birth year] (75 y.o. F) Treating RN: Cornell Barman Primary Care Physician: [name] Other Clinician: Referring Physician: [name] Treating Physician/Extender: [name] in Treatment: 10 Education Assessment Education Provided To: Patient Education Topics Provided Venous: Handouts: Controlling Swelling with Multilayered Compression Wraps Methods: Demonstration, Explain/Verbal Responses: State content correctly Wound/Skin  Impairment: Handouts: Caring for Your Ulcer Methods: Demonstration, Explain/Verbal Responses: State content correctly Electronic Signature(s) Signed: 06/23/2021 3:25:01 PM By: [name], [name], RN, CWS, [name], [name] Entered By: [name], [name], RN, CWS, Kim on 06/23/2021 14:02:58 [name], [name] -------------------------------------------------------------------------------- Wound Assessment Details Patient Name: [name], [name]. Date of Service: 06/23/2021 1:45 PM Medical Record Number: [id number] Patient Account Number: [id number] Date of Birth/Sex: 12-30-1945 (75 y.o. F) Treating RN: Cornell Barman Primary Care Zimir Kittleson: [name] Other Clinician: Referring Cassie Henkels: [name] Treating Roscoe Witts/Extender: Jeri Cos Weeks in Treatment: 10 Wound Status Wound Number: 13 Primary Etiology: Venous Leg Ulcer Wound Location: Left, Distal, Posterior Lower Leg Wound Status: Open Wounding Event: Gradually Appeared Date Acquired: 02/24/2021 Weeks Of Treatment: 10 Clustered Wound: No Wound Measurements Length: (cm) 1.7 Width: (cm) 1.7 Depth: (cm) 0.2 Area: (cm) 2.27 Volume: (cm) 0.454 % Reduction in Area: 61.5% % Reduction in Volume: 22.9% Wound Description Classification: Full Thickness Without Exposed Support Structu Exudate Amount: Medium Exudate Type: Serosanguineous Exudate Color: red, brown res Treatment Notes Wound #13 (Lower Leg) Wound Laterality: Left, Posterior, Distal Cleanser Soap and Water Discharge Instruction: Gently cleanse wound with antibacterial soap, rinse and pat dry prior to dressing wounds Peri-Wound Care Topical Triamcinolone Acetonide Cream, 0.1%, 15 (g) tube Discharge Instruction: Peri wound Apply as directed by Fonda Rochon. Primary Dressing Prisma 4.34 (in) Discharge Instruction: Moisten w/normal saline or sterile  water; Cover wound as directed. Do not remove from wound bed. Secondary Dressing ABD Pad 5x9 (in/in) Discharge Instruction:  Cover with ABD pad Secured With Compression Wrap Medichoice 4 layer Compression System, 35-40 mmHG Discharge Instruction: Apply multi-layer wrap as directed. Compression Stockings Add-Ons Electronic Signature(s) Signed: 06/23/2021 3:25:01 PM By: [name], [name], RN, CWS, [name], [name] [address] Madilyn Lenna Sciara ([id number]) Entered By: [name], [name], RN, CWS, Kim on 06/23/2021 13:57:44 Latandra, Holme [name] -------------------------------------------------------------------------------- Wound Assessment Details Patient Name: [name], SANDEFER. Date of Service: 06/23/2021 1:45 PM Medical Record Number: [id number] Patient Account Number: [id number] Date of Birth/Sex: 1946/03/16 (75 y.o. F) Treating RN: Cornell Barman Primary Care Sharetta Ricchio: [name] Other Clinician: Referring Nikolas Casher: [name] Treating Abagail Limb/Extender: Jeri Cos Weeks in Treatment: 10 Wound Status Wound Number: 14 Primary Etiology: Venous Leg Ulcer Wound Location: Right Lower Leg Wound Status: Open Wounding Event: Gradually Appeared Date Acquired: 05/05/2021 Weeks Of Treatment: 3 Clustered Wound: No Wound Measurements Length: (cm) 2 Width: (cm) 2 Depth: (cm) 0.1 Area: (cm) 3.142 Volume: (cm) 0.314 % Reduction in Area: 83.8% % Reduction in Volume: 83.8% Wound Description Classification: Partial Thickness Exudate Amount: Medium Exudate Type: Serosanguineous Exudate Color: red, brown Treatment Notes Wound #14 (Lower Leg) Wound Laterality: Right Cleanser Soap and Water Discharge Instruction: Gently cleanse wound with antibacterial soap, rinse and pat dry prior to dressing wounds Peri-Wound Care Topical Triamcinolone Acetonide Cream, 0.1%, 15 (g) tube Discharge Instruction: Peri wound Apply as directed by Laloni Rowton. Primary Dressing Non-Adherent Pad 3x8 (in/in) Discharge Instruction: Add to wound bed to alleviate sticking. Secondary Dressing Non-Adherent Pad 3x8 (in/in) Secured With Coban Cohesive  Bandage 4x5 (yds) Stretched Discharge Instruction: Apply Coban as directed. Compression Wrap Compression Stockings Add-Ons Electronic Signature(s) Signed: 06/23/2021 3:25:01 PM By: [name], [name], RN, CWS, [name], [name] Entered By: [name], [name], RN, CWS, Kim on 06/23/2021 13:57:44 SHELBI, JUSZCZAK ([id number], WYLER ([id number]) -------------------------------------------------------------------------------- Vitals Details Patient Name: INTISAR, LANDIN. Date of Service: 06/23/2021 1:45 PM Medical Record Number: [id number] Patient Account Number: [id number] Date of Birth/Sex: [birth year] (75 y.o. F) Treating RN: Cornell Barman Primary Care Raghad Lorenz: [name] Other Clinician: Referring Shandreka Dante: [name] Treating Ferlando Lia/Extender: [name] in Treatment: 10 Vital Signs Time Taken: 13:56 Temperature (F): 98.5 Height (in): 63 Pulse (bpm): 62 Weight (lbs): 212 Respiratory Rate (breaths/min): 16 Body Mass Index (BMI): 37.6 Blood Pressure (mmHg): 154/82 Reference Range: 80 - 120 mg / dl Electronic Signature(s) Signed: 06/23/2021 3:25:01 PM By: [name], [name], RN, CWS, [name], [name] Entered By: [name], [name], RN, CWS, Kim on 06/23/2021 13:56:47

## 2021-06-23 NOTE — Telephone Encounter (Signed)
Lvm to call back.  Need to get answer to Dr. Synthia Innocent question and relay his message.

## 2021-06-23 NOTE — Telephone Encounter (Signed)
[name] called in and said no leg weakness and the pain has moved back up to her butt and she is using her walker. And related the apt time

## 2021-06-23 NOTE — Patient Instructions (Addendum)
I think you have recurrent sciatica on the right side  Toradol shot today.  Continue tramadol as needed, may use oxycodone as needed for breakthrough pain, don't mix with tramadol.   Let us know if recurrent or worsening symptoms.  Consider physical therapy.

## 2021-06-23 NOTE — Telephone Encounter (Signed)
[facility] - Client TELEPHONE ADVICE RECORD [facility] Patient Name: [name] [place] Gender: Female DOB: 12/26/1945 Age: 75 Y 11 M 14 D Return Phone Number: [phone] (Primary) Address: City/ State/ Zip: [place] [address] Client [place] [facility] - Client Client Site [place] - Day Physician [name] - MD Contact Type Call Who Is Calling Patient / Member / Family / Caregiver Call Type Triage / Clinical Relationship To Patient Self Return Phone Number [phone] (Primary) Chief Complaint Weakness, Localized Reason for Call Symptomatic / Request for Health Information Initial Comment [name] CMA from [facility] is transferring mutual pt who was prescribed Trammadol for leg pain and describes heaviness/weakness in one leg. 3rd episode of sciatica and weakness in leg/ inablity to put weight on leg is consistent with other episodes. Not sudden/new. Translation No Nurse Assessment Nurse: Phoebe Perch, RN, Dagoberto Date/Time (Eastern Time): 06/22/2021 5:37:59 PM Confirm and document reason for call. If symptomatic, describe symptoms. ---Caller states she has heaviness/weakness in one leg. History of sciatica. States this is the 3rd episode of sciatica and weakness in leg and inability to put weight. Symptoms started Sunday. Describes it as pain starting in glute area that radiates down the leg. Taking Tramadol but pain has not gotten worse but hasn't gotten better either. Does the patient have any new or worsening symptoms? ---Yes Will a triage be completed? ---Yes Related visit to physician within the last 2 weeks? ---Yes Does the PT have any chronic conditions? (i.e. diabetes, asthma, this includes High risk factors for pregnancy, etc.) ---Yes List chronic conditions. ---Uses C-PAP for sleep apnea. HTN. Allergy-induced asthma. Sciatica. Is this a behavioral health or substance  abuse call? ---No PLEASE NOTE: All timestamps contained within this report are represented as [place] Standard Time. CONFIDENTIALTY NOTICE: This fax transmission is intended only for the addressee. It contains information that is legally privileged, confidential or otherwise protected from use or disclosure. If you are not the intended recipient, you are strictly prohibited from reviewing, disclosing, copying using or disseminating any of this information or taking any action in reliance on or regarding this information. If you have received this fax in error, please notify us immediately by telephone so that we can arrange for its return to [place]. Phone: [phone], Toll-Free: [phone], Fax: [phone] Page: 2 of 2 Call Id: [id number] Guidelines Guideline Title Affirmed Question Affirmed Notes Nurse Date/Time [name] Time) Leg Pain [1] Thigh, calf, or ankle swelling AND [2] only 1 side Phoebe Perch, RN, Dagoberto 06/22/2021 5:43:51 PM Disp. Time [name] Time) Disposition Final User 06/22/2021 5:50:39 PM See HCP within 4 Hours (or PCP triage) Yes Phoebe Perch, RN, [place] Disagree/Comply Disagree Caller Understands Yes PreDisposition [place] Advice Given Per Guideline SEE HCP (OR PCP TRIAGE) WITHIN 4 HOURS: * IF OFFICE WILL BE OPEN: You need to be seen within the next 3 or 4 hours. Call your doctor (or NP/PA) now or as soon as the office opens. * IF OFFICE WILL BE CLOSED AND NO PCP (PRIMARY CARE PROVIDER) SECOND-LEVEL TRIAGE: You need to be seen within the next 3 or 4 hours. A nearby Urgent Care Center [facility]) is often a good source of care. Another choice is to go to the ED. Go sooner if you become worse. CALL BACK IF: * You become worse CARE ADVICE given per Leg Pain (Adult) guideline. Comments User: Zenaida Deed, RN Date/Time [name] Time): 06/22/2021 5:46:14 PM Currently rates pain 4-5/10. Currently is sitting.  Pain increases when walking or standing  still. User: Zenaida Deed, RN Date/Time [name] Time): 06/22/2021 5:52:00 PM Caller refused to go to UC/ED tonight. Stated she does not believe her swelling is due to a bloot clot because she's had blood clots before and she knows the pain and the symptoms that come with that. Caller stated she has an appointment with wound care tomorrow and will also follow up with PCP office tomorrow in regards to Tramadol instructions. Advised caller to call us back for any new or worsening symptoms. Caller verbalized understanding. Referrals GO TO FACILITY REFUSED

## 2021-06-25 ENCOUNTER — Other Ambulatory Visit: Payer: Self-pay | Admitting: Family Medicine

## 2021-06-28 ENCOUNTER — Encounter (HOSPITAL_BASED_OUTPATIENT_CLINIC_OR_DEPARTMENT_OTHER): Payer: Medicare Other | Admitting: Internal Medicine

## 2021-06-28 ENCOUNTER — Other Ambulatory Visit: Payer: Self-pay

## 2021-06-28 DIAGNOSIS — L97221 Non-pressure chronic ulcer of left calf limited to breakdown of skin: Secondary | ICD-10-CM

## 2021-06-28 DIAGNOSIS — I87322 Chronic venous hypertension (idiopathic) with inflammation of left lower extremity: Secondary | ICD-10-CM

## 2021-06-28 DIAGNOSIS — I890 Lymphedema, not elsewhere classified: Secondary | ICD-10-CM | POA: Diagnosis not present

## 2021-06-28 DIAGNOSIS — L97821 Non-pressure chronic ulcer of other part of left lower leg limited to breakdown of skin: Secondary | ICD-10-CM | POA: Diagnosis not present

## 2021-06-29 ENCOUNTER — Telehealth: Payer: Self-pay | Admitting: Family Medicine

## 2021-06-29 NOTE — Telephone Encounter (Signed)
Patient is calling in requesting a refill on the prescription OXYCODONE 5 MG (one by mouth every 6 hours as needed)

## 2021-06-29 NOTE — Telephone Encounter (Signed)
[name] called in wanted to update Dr. Darnell Level on her condition, she stated feeling better but healed. She stated it moved to under her butt, and she cant bend to grab anything, and sitting is okay but moving gives her a sharp pain.  She is on Tramadol and she has Oxycodone left from the hospital and she only has 6 left.

## 2021-06-29 NOTE — Telephone Encounter (Signed)
Last apt 06/23/21 No future apt scheduled Medication prescribed by ER Oxycodone, '5mg'$ , PRN Q6H

## 2021-06-30 ENCOUNTER — Ambulatory Visit: Payer: Medicare Other | Admitting: Vascular Surgery

## 2021-06-30 ENCOUNTER — Encounter (HOSPITAL_COMMUNITY): Payer: Medicare Other

## 2021-06-30 MED ORDER — OXYCODONE HCL 5 MG PO TABS: 5.0000 mg | ORAL_TABLET | Freq: Three times a day (TID) | ORAL | 0 refills | Status: DC | PRN

## 2021-06-30 NOTE — Telephone Encounter (Signed)
Oxycodone refilled in place of tramadol.

## 2021-07-03 ENCOUNTER — Telehealth: Payer: Self-pay | Admitting: *Deleted

## 2021-07-03 NOTE — Telephone Encounter (Signed)
PLEASE NOTE: All timestamps contained within this report are represented as [place] Standard Time. CONFIDENTIALTY NOTICE: This fax transmission is intended only for the addressee. It contains information that is legally privileged, confidential or otherwise protected from use or disclosure. If you are not the intended recipient, you are strictly prohibited from reviewing, disclosing, copying using or disseminating any of this information or taking any action in reliance on or regarding this information. If you have received this fax in error, please notify us immediately by telephone so that we can arrange for its return to [place]. Phone: [phone], Toll-Free: [phone], Fax: [phone] Page: 1 of 2 Call Id: [id number] [place] [facility] - Client TELEPHONE ADVICE RECORD [facility] Patient Name: [name] [place] Gender: Female DOB: 06/28/1946 Age: 75 Y 11 M 24 D Return Phone Number: [phone] (Secondary) Address: City/ State/ Zip: [place] [address] Client [facility] - Client Client Site [place] Physician [name] - MD Contact Type Call Who Is Calling Patient / Member / Family / Caregiver Call Type Triage / Clinical Relationship To Patient Self Return Phone Number [phone] (Secondary) Chief Complaint Back Pain - General Reason for Call Symptomatic / Request for Health Information Initial Comment Caller states she is on medication for sciatica and has a question about switching pain medications. She has lower back pain to the right side, running down her leg and under the right butt cheek. Translation No Nurse Assessment Nurse: Terence Lux, RN, Lana Date/Time (Eastern Time): 07/02/2021 12:26:17 AM Confirm and document reason for call. If symptomatic, describe symptoms. ---Caller states was seen in ER in August and dx with sciatica. Has taken prednisone to help  with inflammation. Was prescribed tramadol by PCP but was told that if it did not help can try stronger medication. Pain now 4/10 at rest. Lower back pain to the right side, running down her leg and under the right butt cheek. Does the patient have any new or worsening symptoms? ---Yes Will a triage be completed? ---Yes Related visit to physician within the last 2 weeks? ---Yes Does the PT have any chronic conditions? (i.e. diabetes, asthma, this includes High risk factors for pregnancy, etc.) ---Yes List chronic conditions. ---Back pain, asthma, sleep apnea, HTN Is this a behavioral health or substance abuse call? ---No Guidelines Guideline Title Affirmed Question Affirmed Notes Nurse Date/Time (Eastern Time) Back Pain [1] MODERATE back pain (e.g., interferes with normal activities) Terence Lux, Therapist, sports, Lana 07/02/2021 12:32:40 AM PLEASE NOTE: All timestamps contained within this report are represented as [place] Standard Time. CONFIDENTIALTY NOTICE: This fax transmission is intended only for the addressee. It contains information that is legally privileged, confidential or otherwise protected from use or disclosure. If you are not the intended recipient, you are strictly prohibited from reviewing, disclosing, copying using or disseminating any of this information or taking any action in reliance on or regarding this information. If you have received this fax in error, please notify us immediately by telephone so that we can arrange for its return to [place]. Phone: [phone], Toll-Free: [phone], Fax: [phone] Page: 2 of 2 Call Id: [id number] Guidelines Guideline Title Affirmed Question Affirmed Notes Nurse Date/Time [name] Time) AND [2] present > 3 days Disp. Time [name] Time) Disposition Final User 07/02/2021 12:40:17 AM SEE PCP WITHIN 3 DAYS Yes Terence Lux, RN, Lana Caller Disagree/Comply Comply Caller Understands Yes PreDisposition Call Doctor Care Advice Given Per  Guideline SEE PCP WITHIN 3 DAYS: * You need to  be seen within 2 or 3 days. USE HEAT: * Use a heat pack, heating pad, or warm wet washcloth. * Do this for 10 minutes three times a day. SLEEP: * Sleep on your side with a pillow between your knees. * If you sleep on your back, place a pillow under your knees. * Avoid sleeping on the abdomen. The mattress should be firm or reinforced with a board. ACTIVITY: * Avoid any activities that cause severe pain. * Use caution and commonsense when performing heavy lifting. Similarly, be careful during strenuous exercise. PAIN MEDICINES: * For pain relief, you can take either acetaminophen, ibuprofen, or naproxen. CALL BACK IF: * Numbness or weakness occurs, or bowel/bladder problems * There are any urine symptoms or fever * You become worse CARE ADVICE given per Back Pain (Adult) guideline. Referrals REFERRED TO PCP OFFICE

## 2021-07-03 NOTE — Progress Notes (Signed)
QIARA, HARB ([id number]) Visit Report for 06/28/2021 Chief Complaint Document Details Patient Name: [name], [name]. Date of Service: 06/28/2021 1:15 PM Medical Record Number: [id number] Patient Account Number: [id number] Date of Birth/Sex: 11-Mar-1946 (75 y.o. F) Treating RN: Carlene Coria Primary Care Provider: [name] Other Clinician: Referring Provider: [name] Treating Provider/Extender: Yaakov Guthrie in Treatment: 11 Information Obtained from: Patient Chief Complaint Left calf venous stasis ulceration. 11/13/16; the patient is here for a left calf recurrent ulceration which is painful and has been present for the last month 01/22/17; the patient re-presents here for recurrent difficulties with blistering and leaking fluid on her left posterior calf 12/10/18; the patient returns to clinic for a wound on the left medial calf 04/12/2021; left lower extremity wound, 05/31/21; right leg wound Electronic Signature(s) Signed: 06/28/2021 2:12:34 PM By: Kalman Shan DO Entered By: Kalman Shan on 06/28/2021 14:08:15 [name], [name] -------------------------------------------------------------------------------- HPI Details Patient Name: [name], [name]. Date of Service: 06/28/2021 1:15 PM Medical Record Number: [id number] Patient Account Number: [id number] Date of Birth/Sex: 10-21-1946 (75 y.o. F) Treating RN: Carlene Coria Primary Care Provider: [name] Other Clinician: Referring Provider: [name] Treating Provider/Extender: Yaakov Guthrie in Treatment: 11 History of Present Illness HPI Description: Pleasant 75 year old with history of chronic venous insufficiency. No diabetes or peripheral vascular disease. Left ABI 1.29. Questionable history of left lower extremity DVT. She developed a recurrent ulceration on her left lateral calf in December 2015, which she attributes to poor diet and subsequent lower extremity edema.  She underwent endovenous laser ablation of her left greater saphenous vein in 2010. She underwent laser ablation of accessory branch of left GSV in April 2016 by Dr. Kellie Simmering at [facility]. She was previously wearing Unna boots, which she tolerated well. Tolerating 2 layer compression and cadexomer iodine. She returns to clinic for follow-up and is without new complaints. She denies any significant pain at this time. She reports persistent pain with pressure. No claudication or ischemic rest pain. No fever or chills. No drainage. READMISSION 11/13/16; this is a 75 year old woman who is not a diabetic. She is here for a review of a painful area on her left medial lower extremity. I note that she was seen here previously last year for wound I believe to be in the same area. At that time she had undergone previously a left greater saphenous vein ablation by Dr. Kellie Simmering and she had a ablation of the anterior accessory branch of the left greater saphenous vein in March 2016. Seeing that the wound actually closed over. In reviewing the history with her today the ulcer in this area has been recurrent. She describes a biopsy of this area in 2009 that only showed stasis physiology. She also has a history of today malignant melanoma in the right shoulder for which she follows with Dr. 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 Do not see that she has ever had arterial studies in the left leg. She tells me she has a follow-up with Dr. Kellie Simmering in roughly 10 days In any case she developed the reopening of this area roughly a month ago. On the background of this she describes rapidly increasing edema which has responded to Lasix 40 mg and metolazone 2.5 mg as well as the patient's lymph massage. She has been told she has both venous insufficiency and lymphedema but she cannot tolerate  compression  stockings 11/28/16; the patient saw Dr. Kellie Simmering recently. Per the patient he did arterial Dopplers in the office that did not show evidence of arterial insufficiency, per the patient he stated "treat this like an ordinary venous ulcer". She also saw her dermatologist Dr. Ronnald Ramp who felt that this was more of a vascular ulcer. In general things are improving although she arrives today with increasing bilateral lower extremity edema with weeping a deeper fluid through the wound on the left medial leg compatible with some degree of lymphedema 12/04/16; the patient's wound is fully epithelialized but I don't think fully healed. We will do another week of depression with Promogran and TCA however I suspect we'll be able to discharge her next week. This is a very unusual-looking wound which was initially a figure-of-eight type wound lying on its side surrounded by petechial like hemorrhage. She has had venous ablation on this side. She apparently does not have an arterial issue per Dr. Kellie Simmering. She saw her dermatologist thought it was "vascular". Patient is definitely going to need ongoing compression and I talked about this with her today she will go to elastic therapy after she leaves here next week 12/11/16; the patient's wound is not completely closed today. She has surrounding scar tissue and in further discussion with the patient it would appear that she had ulcers in this area in 2009 for a prolonged period of time ultimately requiring a punch biopsy of this area that only showed venous insufficiency. I did not previously pickup on this part of the history from the patient. 12/18/16; the patient's wound is completely epithelialized. There is no open area here. She has significant bilateral venous insufficiency with secondary lymphedema to a mild-to-moderate degree she does not have compression stockings.. She did not say anything to me when I was in the room, she told our intake nurse that she was still  having pain in this area. This isn't unusual recurrent small open area. She is going to go to elastic therapy to obtain compression stockings. 12/25/16; the patient's wound is fully epithelialized. There is no open area here. The patient describes some continued episodic discomfort in this area medial left calf. However everything looks fine and healed here. She is been to elastic therapy and caught herself 15-20 mmHg stockings, they apparently were having trouble getting 20-30 mm stockings in her size 01/22/17; this is a patient we discharged from the clinic a month ago. She has a recurrent open wound on her medial left calf. She had 15 mm support stockings. I told her I thought she needed 20-30 mm compression stockings. She tells me that she has been ill with hospitalization secondary to asthma and is been found to have severe hypokalemia likely secondary to a combination of Lasix and metolazone. This morning she noted blistering and leaking fluid on the posterior part of her left leg. She called our intake nurse urgently and we was saw her this afternoon. She has not had any real discomfort here. I don't know that she's been wearing any stockings on this leg for at least 2-3 days. ABIs in this clinic were 1.21 on the right and 1.3 on the left. She is previously seen vascular surgery who does not think that there is a peripheral arterial issue. 01/30/17; Patient arrives with no open wound on the left leg. She has been to elastic therapy and obtained 20-39mmhg below knee stockings and she has one on the right leg today. READMISSION 02/19/18; this Miklos is a now  75 year old patient we've had in this clinic perhaps 3 times before. I had last looked at her from January 07 December 2016 with an area on the medial left leg. We discharged her on 12/25/16 however she had to be readmitted on 01/22/17 with a recurrence. I have in my notes that we discharged her on 20-30 mm stockings although she tells me she was  only wearing support hose because she cannot get stockings on predominantly related to her cervical spine surgery/issues. She has had previous ablations done by vein and vascular in [place] including a great saphenous vein ablation on the left with an anterior accessory branch ablation I think both of these were in 2016. On one of the previous visit she had a biopsy noted 2009 that was negative. She is not felt to have an arterial issue. She is not a diabetic. She does have a history of obstructive sleep apnea hypertension asthma as well as chronic venous insufficiency and lymphedema. On this occasion she noted 2 dry scaly patch on her left leg. She tried to put lotion on this it didn't really help. There were 2 open areas.the patient has been seeing her primary physician from 02/05/18 through 02/14/18. She had Unna boots applied. The superior wound now on the lateral left leg has closed but she's had one wound that remains open on the lateral left leg. This is not the same spot as we dealt with in 2018. ABIs in this clinic were 1.3 bilaterally [name], [name] ([id number]) 02/26/18; patient has a small wound on the left lateral calf. Dimensions are down. She has chronic venous insufficiency and lymphedema. 03/05/18; small open area on the left lateral calf. Dimensions are down. Tightly adherent necrotic debris over the surface of the wound which was difficult to remove. Also the dressing [over collagen] stuck to the wound surface. This was removed with some difficulty as well. Change the primary dressing to Hydrofera Blue ready 03/12/18; small open area on the left lateral calf. Comes in with tightly adherent surface eschar as well as some adherent Hydrofera Blue. 03/19/18; open area on the left lateral calf. Again adherent surface eschar as well as some adherent Hydrofera Blue nonviable subcutaneous tissue. She complained of pain all week even with the reduction from 4-3 layer compression I put on last  week. Also she had an increase in her ankle and calf measurements probably related to the same thing. 03/26/18; open area on the left lateral calf. A very small open area remains here. We used silver alginate starting last week as the Hydrofera Blue seem to stick to the wound bed. In using 4-layer compression 04/02/18; the open area in the left lateral calf at some adherent slough which I removed there is no open area here. We are able to transition her into her own compression stocking. Truthfully I think this is probably his support hose. However this does not maintain skin integrity will be limited. She cannot put over the toe compression stockings on because of neck problems hand problems etc. She is allergic to the lining layer of juxta lites. We might be forced to use extremitease stocking should this fail READMIT 11/24/2018 Patient is now a 75 year old woman who is not a diabetic. She has been in this clinic on at least 3 previous occasions largely with recurrent wounds on her left leg secondary to chronic venous insufficiency with secondary lymphedema. Her situation is complicated by inability to get stockings on and an allergy to neoprene which is apparently a  component and at least juxta lites and other stockings. As a result she really has not been wearing any stockings on her legs. She tells [place] that roughly 2 or 3 weeks ago she started noticing a stinging sensation just above her ankle on the left medial aspect. She has been diagnosed with pseudogout and she wondered whether this was what she was experiencing. She tried to dress this with something she bought at the store however subsequently it pulled skin off and now she has an open wound that is not improving. She has been using Vaseline gauze with a cover bandage. She saw her primary doctor last week who put an Haematologist on her. ABIs in this clinic was 1.03 on the left 2/12; the area is on the left medial ankle. Odd-looking wound with  what looks to be surface epithelialization but a multitude of small petechial openings. This clearly not closed yet. We have been using silver alginate under 3 layer compression with TCA 2/19; the wound area did not look quite as good this week. Necrotic debris over the majority of the wound surface which required debridement. She continues to have a multitude of what looked to be small petechial openings. She reminds [place] that she had a biopsy on this initially during her first outbreak in 2015 in [place] dermat[facility]. She expresses concern about this being a possible melanoma. 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 I assured her that this does not look anything like melanoma. Except for the petechial reaction it does look like a venous insufficiency area and she certainly has evidence of this on both sides 2/26; a difficult area on the left medial ankle. The patient clearly has chronic venous hypertension with some degree of lymphedema. The odd thing about the area is the small petechial hemorrhages. I am not really sure how to explain this. This was present last time and this is not a compression injury. We have been using Hydrofera Blue which I changed to last week 3/4; still using Hydrofera Blue. Aggressive debridement today. She does not have known arterial issues. She has seen Dr. Kellie Simmering at [name] vein and vascular and and has an ablation on the left. [Anterior accessory branch of the greater saphenous]. From what I remember they did not feel she had an arterial issue. The patient has had this area biopsied in 2009 at [facility] dermatology and by her recollection they said this was "stasis". She is also follow-up with dermatology locally who thought that this was more of a vascular issue 3/11; using Hydrofera Blue. Aggressive debridement today. She does not have an arterial issue. We are using 3  layer compression although we may need to go to 4. The patient has been in for multiple changes to her wrap since I last saw her a week ago. She says that the area was leaking. I do not have too much more information on what was found 01/19/19 on evaluation today patient was actually being seen for a nurse visit when unfortunately she had the area on her left lateral lower extremity as well as weeping from the right lower extremity that became apparent. Therefore we did end up actually seeing her for a full visit with myself. She is having some pain at this site as well but fortunately nothing too significant at this point. No fevers, chills, nausea, or vomiting noted at this time. 3/18-Patient is back to the clinic with  the left leg venous leg ulcer, the ulcer is larger in size, has a surface that is densely adherent with fibrinous tissue, the Hydrofera Blue was used but is densely adherent and there was difficulty in removing it. The right lower extremity was also wrapped for weeping edema. Patient has a new area over the left lateral foot above the malleolus that is small and appears to have no debris with intact surrounding skin. Patient is on increased dose of Lasix also as a means to edema management 3/25; the patient has a nonhealing venous ulcer on the medial left leg and last week developed a smaller area on the lateral left calf. We have been using Hydrofera Blue with a contact layer. 4/1; no major change in these wounds areas. Left medial and more recently left lateral calf. I tried Iodoflex last week to aid in debridement she did not tolerate this. She stated her pain was terrible all week. She took the top layer of the 4 layer compression off. 4/8; the patient actually looks somewhat better in terms of her more prominent left lateral calf wound. There is some healthy looking tissue here. She is still complaining of a lot of discomfort. 4/15; patient in a lot of pain secondary to  sciatica. She is on a prednisone taper prescribed by her primary physician. She has the 2 areas one on the left medial and more recently a smaller area on the left lateral calf. Both of these just above the malleoli 4/22; her back pain is better but she still states she is very uncomfortable and now feels she is intolerant to the [employer]. No real change in the wounds we have been using Sorbact. She has been previously intolerant to Iodoflex. There is not a lot of option about what we can use to debride this wound under compression that she no doubt needs. sHe states Ultram no longer works for her pain 4/29; no major change in the wounds slightly increased depth. Surface on the original medial wound perhaps somewhat improved however the more recent area on the lateral left ankle is 100% covered in very adherent debris we have been using Sorbact. She tolerates 4 layer compression well and her edema control is a lot better. She has not had to come in for a nurse check 5/6; no major change in the condition of the wounds. She did consent to debridement today which was done with some difficulty. Continuing Sorbact. She did not tolerate Iodoflex. She was in for a check of her compression the day after we wrapped her last week this was adjusted but nothing much was found 5/13; no major change in the condition or area of the wounds. I was able to get a fairly aggressive debridement done on the lateral left leg wound. Even using Sorbact under compression. She came back in on Friday to have the wrap changed. She says she felt uncomfortable on the lateral aspect of her ankle. She has a long history of chronic venous insufficiency including previous ablation surgery on this side. 5/20-Patient returns for wounds on left leg with both wounds covered in slough, with the lateral leg wound larger in size, she has been in 3 layer compression and felt more comfortable, she describes pain in ankle, in leg and pins and  needles in foot, and is about to try Pamelor for this 6/3; wounds on the left lateral and left medial leg. The area medially which is the most recent of the 2 seems to have had  the largest increase in [place], [place] ([id number]) dimensions. We have been using Sorbac to try and debride the surface. She has been to see orthopedics they apparently did a plain x-ray that was indeterminant. Diagnosed her with neuropathy and they have ordered an MRI to determine if there is underlying osteomyelitis. This was not high on my thought list but I suppose it is prudent. We have advised her to make an appointment with vein and vascular in [place]. She has a history of a left greater saphenous and accessory vein ablations I wonder if there is anything else that can be done from a surgical point of view to help in these difficult refractory wounds. We have previously healed this wound on one occasion but it keeps on reopening [medial side] 6/10; deep tissue culture I did last week I think on the left medial wound showed both moderate E. coli and moderate staph aureus [MSSA]. She is going to require antibiotics and I have chosen Augmentin. We have been using Sorbact and we have made better looking wound surface on both sides but certainly no improvement in wound area. She was back in last Friday apparently for a dressing changes the wrap was hurting her outer left ankle. She has not managed to get a hold of vein and vascular in [place]. We are going to have to make her that appointment 6/17; patient is tolerating the Augmentin. She had an MRI that I think was ordered by orthopedic surgeon this did not show osteomyelitis or an abscess did suggest cellulitis. We have been using Sorbact to the lateral and medial ankles. We have been trying to arrange a follow-up appointment with vein and vascular in [place] or did her original ablations. We apparently an area sent the request to vein and vascular in  [facility] 6/24; patient has completed the Augmentin. We do not yet have a vein and vascular appointment in [place]. I am not sure what the issue is here we have asked her to call tomorrow. We are using Sorbact. Making some improvements and especially the medial wound. Both surfaces however look better medial and lateral. 7/1; the patient has been in contact with vein and vascular in [place] but has not yet received an appointment. Using Sorbact we have gradually improve the wound surface with no improvement in surface area. She is approved for Apligraf but the wound surface still is not completely viable. She has not had to come in for a dressing change 7/8; the patient has an appointment with vein and vascular on 7/31 which is a Friday afternoon. She is concerned about getting back here for [place] to dress her wounds. I think it is important to have them goal for her venous reflux/history of ablations etc. to see if anything else can be done. She apparently tested positive for 1 of the blood tests with regards to lupus and saw a rheumatologist. He has raised the issue of vasculitis again. I have had this thought in the past however the evidence seems overwhelming that this is a venous reflux etiology. If the rheumatologist tells me there is clinical and laboratory investigation is positive for lupus I will rethink this. 7/15; the patient's wound surfaces are quite a bit better. The medial area which was her original wound now has no depth although the lateral wound which was the more recent area actually appears larger. Both with viable surfaces which is indeed better. Using Sorbact. I wanted to use Apligraf on her however there is the issue of  the vein and vascular appointment on 7/31 at 2:00 in the afternoon which would not allow her to get back to be rewrapped and they would no doubt remove the graft 7/22; the patient's wound surfaces have moderate amount of debris although generally look  better. The lateral one is larger with 2 small satellite areas superiorly. We are waiting for her vein and vascular appointment on 7/31. She has been approved for Apligraf which I would like to use after th 7/29; wound surfaces have improved no debridement is required we have been using Sorbact. She sees vein and vascular on Friday with this so question of whether anything can be done to lessen the likelihood of recurrence and/or speed the healing of these areas. She is already had previous ablations. She no doubt has severe venous hypertension 8/5-Patient returns at 1 week, she was in [place] for 3 days by her podiatrist, we have been using so backed to the wound, she has increased pain in both the wounds on the left lower leg especially the more distal one on the lateral aspect 8/12-Patient returns at 1 week and she is agreeable to having debridement in both wounds on her left leg today. We have been using Sorbact, and vascular studies were reviewed at last visit 8/19; the patient arrives with her wounds fairly clean and no debridement is required. We have used Sorbact which is really done a nice job in cleaning up these very difficult wound surfaces. The patient saw Dr. Donzetta Matters of vascular surgery on 7/31. He did not feel that there was an arterial component. He felt that her treated greater saphenous vein is adequately addressed and that the small saphenous vein did not appear to be involved significantly. She was also noted to have deep venous reflux which is not treatable. Dr. Donzetta Matters mentioned the possibility of a central obstructive component leading to reflux and he offered her central venography. She wanted to discuss this or think about it. I have urged her to go ahead with this. She has had recurrent difficult wounds in these areas which do heal but after months in the clinic. If there is anything that can be done to reduce the likelihood of this I think it is worth it. 9/2 she is still  working towards getting follow-up with Dr. Donzetta Matters to schedule her CT. Things are quite a bit worse venography. I put Apligraf on 2 weeks ago on both wounds on the medial and lateral part of her left lower leg. She arrives in clinic today with 3 superficial additional wounds above the area laterally and one below the wound medially. She describes a lot of discomfort. I think these are probably wrapped injuries. Does not look like she has cellulitis. 07/20/2019 on evaluation today patient appears to be doing somewhat poorly in regard to her lower extremity ulcers. She in fact showed signs of erythema in fact we may even be dealing with an infection at this time. Unfortunately I am unsure if this is just infection or if indeed there may be some allergic reaction that occurred as a result of the Apligraf application. With that being said that would be unusual but nonetheless not impossible in this patient is one who is unfortunately allergic to quite a bit. Currently we have been using the Sorbact which seems to do as well as anything for her. I do think we may want to obtain a culture today to see if there is anything showing up there that may need to be  addressed. 9/16; noted that last week the wounds look worse in 1 week follow-up of the Apligraf. Using Sorbact as of 2 days ago. She arrives with copious amounts of drainage and new skin breakdown on the back of the left calf. The wounds arm more substantial bilaterally. There is a fair amount of swelling in the left calf no overt DVT there is edema present I think in the left greater than right thigh. She is supposed to go on 9/28 for CT venography. The wounds on the medial and lateral calf are worse and she has new skin breakdown posteriorly at least new for me. This is almost developing into a circumferential wound area The Apligraf was taken off last week which I agree with things are not going in the right direction a culture was done we do not have  that back yet. She is on Augmentin that she started 2 days ago 9/23; dressing was changed by her nurses on Monday. In general there is no improvement in the wound areas although the area looks less angry than last week. She did get Augmentin for MSSA cultured on the 14th. She still appears to have too much swelling in the left leg even with 3 layer compression 9/30; the patient underwent her procedure on 9/28 by Dr. Donzetta Matters at [facility]. She was discovered to have the common iliac vein measuring 12.2 mm but at the level of L4-L5 measured 3 mm. After stenting it measured 10 mm. 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 Rouleaux flow in the common femoral and femoral vein was observed much improved after stenting. We are using silver alginate to the wounds on the medial and lateral ankle on the left. 4 layer compression 10/7; the patient had fluid swelling around her knee and 4 layer compression. At the advice of vein and vascular this was reduced to 3 layer which she is tolerating better. We have been using silver alginate under 3 layer compression since last Friday 10/14; arrives with the areas on the left ankle looking a lot better. Inflammation in the area also a lot better. She came in for a nurse check on 10/9 10/21; continued nice improvement. Slight improvements in surface area of both the medial and lateral wounds on the left. A lot of the satellite lesions in the weeping erythema around these from stasis dermatitis is resolved. We have been using silver alginate CASSANDA, WALMER ([id number]) 10/28; general improvement in the entire wound areas although not a lot of change in dimensions the wound certainly looks better. There is a lot less in terms of venous inflammation. Continue silver alginate this week however look towards Hydrofera Blue next week 11/4; very adherent debris on the medial wound left wound is not as bad. We have been using silver alginate.  Change to [facility] today 11/11; very adherent debris on both wound areas. She went to vein and vascular last week and follow-up they put in [place] boot on this today. He says the [facility] was adherent. Wound is definitely not as good as last week. Especially on the left there the satellite lesions look more prominent 11/18; absolutely no better. erythema on lateral aspect with tenderness. 09/30/2019 on evaluation today patient appears to actually be doing better. Dr. Dellia Nims did put her on doxycycline last week which I do believe has helped her at this point. Fortunately there is no signs of active infection at this time. No fevers, chills, nausea, vomiting, or diarrhea.  I do believe he may want extend the doxycycline for 7 additional days just to ensure everything does completely cleared up the patient is in agreement with that plan. Otherwise she is going require some sharp debridement today 12/2; patient is completing a 2-week course of doxycycline. I gave her this empirically for inflammation as well as infection when I last saw her 2 weeks ago. All of this seems to be better. She is using silver alginate she has the area on the medial aspect of the larger area laterally and the 2 small satellite regions laterally above the major wound. 12/9; the patient's wound on the left medial and left lateral calf look really quite good. We have been using silver alginate. She saw vein and vascular in follow-up on 10/09/2019. She has had a previous left greater saphenous vein ablation by Dr. Oscar La in 2016. More recently she underwent a left common iliac vein stent by Dr. 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 The swelling is improved and certainly the wounds have improved. The patient shows [place] today area on the right medial calf there is almost no wound but leaking lymphedema. She says she start this started 3 or 4 days ago. She did not traumatize it. It is not painful. She does not  wear compression on that side 12/16; the patient continues to do well laterally. Medially still requiring debridement. The area on the right calf did not materialize to anything and is not currently open. We wrapped this last time. She has support stockings for that leg although I am not sure they are going to provide adequate compression 12/23; the lateral wound looks stable. Medially still requiring debridement for tightly adherent fibrinous debris. We've been using silver alginate. Surface area not any different 12/30; neither wound is any better with regards to surface and the area on the left lateral is larger. I been using silver alginate to the left lateral which look quite good last week and Sorbact to the left medial 11/11/2019. Lateral wound area actually looks better and somewhat smaller. Medial still requires a very aggressive debridement today. We have been using Sorbact on both wound areas 1/13; not much better still adherent debris bilaterally. I been using Sorbact. She has severe venous hypertension. Probably some degree of dermal fibrosis distally. I wonder whether tighter compression might help and I am going to try that today. We also need to work on the bioburden 1/20; using Sorbact. She has severe venous hypertension status post stent placement for pelvic vein compression. We applied gentamicin last time to see if we could reduce bioburden I had some discussion with her today about the use of pentoxifylline. This is occasionally used in this setting for wounds with refractory venous insufficiency. However this interacts with Plavix. She tells me that she was put on this after stent placement for 3 months. She will call Dr. Claretha Cooper office to discuss 1/27; we are using gentamicin under Sorbact. 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 She has a stent. Wound medially is measuring smaller this week. Laterally measuring slightly larger although she has some  satellite lesions superiorly 2/3; gentamicin under Sorbact under 4-layer compression. 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 She has a stent on Plavix. Her wounds are measuring smaller this week. More substantially laterally where there is a satellite lesion superiorly. 2/10; gentamicin under Sorbac. 4-layer compression. Patient communicated with Dr. Donzetta Matters at vein and vascular in [place]. He is okay with the patient  coming off Plavix I will therefore start her on pentoxifylline for a 1 month trial. In general her wounds look better today. I had some concerns about swelling in the left thigh however she measures 61.5 on the right and 63 on the mid thigh which does not suggest there is any difficulty. The patient is not describing any pain. 2/17; gentamicin under Sorbac 4-layer compression. She has been on pentoxifylline for 1 week and complains of loose stool. No nausea she is eating and drinking well 2/24; the patient apparently came in 2 days ago for a nurse visit when her wrap fell down. Both areas look a little worse this week macerated medially and satellite lesions laterally. Change to silver alginate today 3/3; wounds are larger today especially medially. She also has more swelling in her foot lower leg and I even noted some swelling in her posterior thigh which is tender. I wonder about the patency of her stent. Fortuitously she sees Dr. Claretha Cooper group on Friday 3/10; Mrs. [name] was seen by vein and vascular on 3/5. The patient underwent ultrasound. There was no evidence of thrombosis involving the IVC no evidence of thrombosis involving the right common iliac vein there is no evidence of thrombosis involving the right external iliac vein the left external vein is also patent. The right common iliac vein stent appears patent bilateral common femoral veins are compressible and appear patent. I was concerned about the left common iliac stent however it looks like  this is functional. She has some edema in the posterior thigh that was tender she still has that this week. I also note they had trouble finding the pulses in her left foot and booked her for an ABI baseline in 4 weeks. She will follow up in 6 months for repeat IVC duplex. The patient stopped the pentoxifylline because of diarrhea. It does not look like that was being effective in any case. I have advised her to go back on her aspirin 81 mg tablet, vascular it also suggested this 3/17; comes in today with her wound surfaces a lot better. The excoriations from last week considerably better probably secondary to the TCA. We have been using silver alginate 3/24; comes in today with smaller wounds both medially and laterally. Both required debridement. There are 2 small satellite areas superiorly laterally. She also has a very odd bandlike area in the mid calf almost looking like there was a weakness in the wrap in a localized area. I would write this off as being this however anteriorly she has a small raised ballotable area that is very tender almost reminiscent of an abscess but there was no obvious purulent surface to it. 02/04/20 upon evaluation today patient appears to be doing fairly well in regard to her wounds today. Fortunately there is no signs of active infection at this time. No fevers, chills, nausea, vomiting, or diarrhea. She has been tolerating the dressing changes without complication. Fortunately I feel like she is showing signs of improvement although has been sometime since have seen her. Nonetheless the area of concern that Dr. Dellia Nims had last week where she had possibly an area of the wrap that was we can allow the leg to bulge appears to be doing significantly better today there is no signs of anything worsening. [name], [name] ([id number]) 4/7; the patient's wounds on her medial and lateral left leg continue to contract. We have been using a regular alginate. Last week she  developed an area on the right medial lower  leg which is probably a venous ulcer as well. 4/14; the wounds on her left medial and lateral lower leg continue to contract. Surface eschar. We have been using regular alginate. The area on the right medial lower leg is closed. We have been putting both legs under 4-layer contraction. The patient went back to see vein and vascular she had arterial studies done which were apparently "quite good" per the patient although I have not read their notes I have never felt she had an arterial issue. 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 This is been longstanding and refractory to exercise, leg elevation and longstanding use of compression wraps in our clinic as well as compression stockings on the times we have been able to get these to heal 4/21; we thought she actually might be close this week however she arrives in clinic with a lot of edema in her upper left calf and into her posterior thigh. This is been an intermittent problem here. She says the wrap fell down but it was replaced with a nurse visit on Monday. We are using calcium alginate to the wounds and the wound sizes there not terribly larger than last week but there is a lot more edema 4/28; again wound edges are smaller on both sides. Her edema is better controlled than last time. She is obtained her compression pumps from [employer] although they have not been to her home to set these up. 5/5; left medial and left lateral both look stable. I am not sure the medial is any smaller. We have been using calcium alginate under 4-layer compression. oShe had an area on the right medial. This was eschared today. We have been wrapping this as well. She does not tolerate external compression stockings due to a history of various contact allergies. She has her compression pumps however the representative from the company is coming on her to show her how  to use these tomorrow 5/19; patient with severe chronic venous insufficiency secondary to central venous disease. She had a stent placed in her left common iliac vein. She has done better since but still difficult to control wounds. She comes in today with nothing open on the right leg. Her areas on the left medial and left lateral are just about closed. We are using calcium alginate under 4-layer compression. She is using her external compression pumps at home She only has 15-20 support stockings. States she cannot get anything tighter than that on. 03/30/20-Patient returns at 1 week, the wounds on the left leg are both slightly bigger, the last week she was on 3 layer compression which started to slide down. She is starting to use her lymphedema pumps although she stated on 1 day her right ankle started to swell up and she have to stop that day. Unfortunately the open area seem to oscillate between improving to the point of healing and then flaring up all to do with effectiveness of compression or lack of due to the left leg topography not keeping the compression wraps from rolling down 6/2 patient comes in with a 15/20 mmHg stocking on the right leg. She tells me that she developed a lot of swelling in her ankles she saw orthopedics she was felt to possibly be having a flare of pseudogout versus some other type of arthritis. She was put on steroids for a respiratory issue so that helps with the inflammation. She has not been using the pumps all week. She thinks the left  thigh is more swollen than usual and I would agree with that. She has an appointment with Dr. Donzetta Matters 9 days or so from now 6/9; both wounds on the left medial and left lateral are smaller. We have been using calcium alginate under compression. She does not have an open wound on the right leg she is using a stocking and her compression pumps things are going well. She has an appointment with Dr. Donzetta Matters with regards to her stent in the  left common iliac vein 6/16; the wounds on the left medial and left lateral ankle continues to contract. The patient saw Dr. Donzetta Matters and I think he seems satisfied. Ordered follow-up venous reflux studies on both sides in September. Cautioned that she may need thigh-high stockings. She has been using calcium alginate under compression on the left and her own stocking on the right leg. She tells [place] there are no open wounds on the right 6/23; left lateral is just about closed. Medial required debridement today. We have been using calcium alginate. Extensive discussion about the compression pumps she is only using these on 25 mmHg states she could not take 40 or 30 when the wrap came out to her home to demonstrate these. He said they should not feel tight 6/30; the left lateral wound has a slight amount of eschar. . The area medially is about the same using Hydrofera Blue. 7/7; left lateral wound still has some eschar. I will remove this next week may be closed. The area medially is very small using Hydrofera Blue with improvement. Unfortunately the stockings fell down. Unfortunately the blisters have developed at the edge of where the wrap fell. When this happened she says her legs hurt she did not use her pumps. We are not open Monday for her to come in and change the wraps and she had an appointment yesterday. She also tells me that she is going to have an MRI of her back. She is having pain radiating into her left anterior leg she thinks her from an L5 disc. She saw Dr. Ellene Route of neurosurgery 7/14; the area on the left lateral ankle area is closed. Still a small area medially however it looks better as well. We have been using Hydrofera Blue under 4-layer compression 7/21; left lateral ankle is still closed however her wound on the medial left calf is actually larger. This is probably because Hydrofera Blue got stuck to the wound. She came in for a nurse change on Friday and will do that again this  week I was concerned about the amount of swelling that she had last week however she is using her compression pumps twice a day and the swelling seems well controlled 7/28; remaining wound on the left medial lower leg is smaller. We have been using moistened silver collagen under compression she is coming back for a nurse visit. For reasons that were not really clear she was just keeping her legs elevated and not using her compression pumps. I have asked her to use the compression pumps. She does not have any wounds on the right leg 06/15/20-Patient returns at 2 weeks, her LLE edema is worse and she developed a blister wound that is new and has bigger posterior calf wound on right, we are using Prisma with pad, 4 layer compression. she has been on lasix 40 mg daily 8/18; patient arrives today with things a lot worse than I remember from a few weeks ago. She was seen last week. Noted that her edema  was worse and that she now had a left lateral wound as well as deteriorating edema in the medial and posterior part of the lower leg. She says she is using his or her external compression pumps once a day although I wonder about the compliance. 8/25; weeping area on the right medial lower leg. This had actually gotten a small localized area of her compression stocking wet. oOn the left side there is a large denuded area on the posterior medial lower leg and smaller area on the lateral. This was not the original areas that we dealt with. 9/1 the patient's wound on the left leg include the left lateral and left posterior. Larger superficial wounds weeping. She has very poor edema control. Tender localized edema in the left lower medial ankle/heel probably because of localized wrap issues. She freely admits she is not using the compression pumps. She has been up on her feet a lot. She thinks the hydrofera blue is contributing to the pain she is experiencing.. This is a complaint that I have occasionally  heard 9/8; really not much improvement. The patient is still complaining of a lot of pain particularly when she uses compression pumps. I switched her to silver alginate last time because she found the Hydrofera Blue to be irritating. I don't hear much difference in her description with the silver alginate. 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 She has follow-up with Dr. Donzetta Matters I think for ultrasound next month KEYONI, LAPINSKI ([id number]) 9/15; quite a bit of improvement today. We have less edema and more epithelialization in both of her wound areas on the left medial and left lateral calf. These are not the site of her original wounds in this area. She says she has been using her compression pumps for 30 minutes twice a day, there pain issues that never quite understood. Silver alginate as the primary dressing 9/22; continued improvement. Both areas medially and laterally still have a small open area there is some eschar. She continues to complain of left medial ankle pain. Swelling in the leg is in much better condition. We have been using silver alginate 9/29; continued improvement. Both areas medially and laterally in the left calf look as though they are close some minor surface eschar but I think this is epithelialized. She comes in today saying she has a ruptured disc at L4-L5 cannot bend over to put on her stockings. 10/6; patient comes in today with no open wounds on either leg. However her edema on the left leg in the upper one third of the lower leg is poorly controlled nonpitting. She says that she could not use the pumps for 2 days and then she has been using the last couple of days. It is not clear to me she has been able to get her stocking on. She has back problems. [name] has severe chronic venous insufficiency with secondary lymphedema. Her venous insufficiency is partially centrally mediated and  that she is now post stent in the left common iliac vein. [facility] Thurner's syndrome/physiology]. She follows up with them on 10/15. She wears 20/30 below-knee stockings. She is supposed to use compression pumps at home although I think her compliance about with this is been less than 100%. I have asked her to use these 3 times a day. Finally I think she has lipodermatosclerosis in the left lower leg with an inverted bottle sign. It is  been a major problem controlling the edema in the left leg. The right leg we have had wounds on but not as significant a problem is on the left READMISSION 04/12/2021 [name] is a 75 year old woman we know well in this clinic. She has severe chronic venous insufficiency. 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 I believe she has had bilateral greater saphenous vein ablation in the past as well. She tells me that this wound opened sometime in March. She had a fall and thinks it was initially abrasion. She developed areas she describes as little blisters on the anterior part of her leg and she saw dermatology and was treated for methicillin staph aureus with several rounds of antibiotics. She has been using support stockings on the left leg and says this is the only thing she can get on. Her compression pump use maybe once a day she says if she did not use one she use the other. She comes in today with incredible swelling in the left leg with a wound on the left posterior calf. She has been using Neosporin to this previously a hydrocolloid. 6/15; patient arrives back for 1 week follow-up.Marland Kitchen Apparently her wrap fell down she did not call us to replace this. He has poor edema control. She only uses her compression pumps once a day 6/29; patient presents for 1 week follow-up. She has tolerated the compression wrap well. We have been using Iodoflex under the wrap. She has no issues or complaints today. 7/6; patient presents for 1  week follow-up. She states that the compression wrap rolled down her leg 4 days ago. She has been trying to keep the area covered but has no dressings at home to use. She denies signs of infection. 7/13; patient presents for 1 week follow-up. She states that her compression wrap rolled down her right leg and she called our office and had it placed a few days ago. She has been tolerating the current wrap well. She states that the Iodoflex is causing a burning sensation. She denies signs of infection. 7/27; patient presents for 1 week follow-up She has tolerated the compression wrap well with Hydrofera Blue underneath. She has now developed a wound to her right lower extremity. She reports having a culture done To this area by her dermatologist that she reports is negative. She currently denies signs of infection. 8/30; patient presents for 1 week follow-up. On the left side she has tolerated the compression wrap well with Hydrofera Blue underneath. She reports some discomfort to her right lower extremity with the 3 layer compression. She currently denies signs of infection. 06/14/2021 upon evaluation today patient's wound actually appears to be doing decently well based on what I am seeing currently. She actually has 2 areas 1 on the left distal/posterior lower leg and the other on the right lower leg. Subsequently the measurements are roughly the same may be slightly smaller but in general have not made any trend towards getting worse which is great news. 06/23/2021 upon evaluation today patient appears to be doing pretty well in regard to her wounds. On the right she is having difficulty with sciatic pain and this subsequently has led to her taking the wrap off over the last week her leg is more swollen she is also been on prednisone for 10 days. With that being said I think we may want to just use a compression sock on the left that way she will not  have to worry about the compression wrap being in  place that she cannot get off. With that being said I do believe as well that the patient is going to need to continue with the wrapping on the left we will also need to do a little bit of sharp debridement today. 8/24; patient presents for 1 week follow-up. She has no issues or complaints today. She denies signs of infection. Electronic Signature(s) Signed: 06/28/2021 2:12:34 PM By: Kalman Shan DO Entered By: Kalman Shan on 06/28/2021 14:08:40 Salonga, [name] -------------------------------------------------------------------------------- Physical Exam Details Patient Name: [name], [name]. Date of Service: 06/28/2021 1:15 PM Medical Record Number: [id number] Patient Account Number: [id number] Date of Birth/Sex: 13-Dec-1945 (75 y.o. F) Treating RN: Carlene Coria Primary Care Provider: [name] Other Clinician: Referring Provider: [name] Treating Provider/Extender: Yaakov Guthrie in Treatment: 11 Constitutional . Cardiovascular . Psychiatric . Notes Left lower extremity: To the distal medial aspect there is an open wound with granulation tissue throughout with scant nonviable tissue. No obvious signs of infection. Right lower extremity: To the distal medial aspect there is some dried lymphedema fluid but no obvious wounds. Electronic Signature(s) Signed: 06/28/2021 2:12:34 PM By: Kalman Shan DO Entered By: Kalman Shan on 06/28/2021 14:09:59 [name], [name] -------------------------------------------------------------------------------- Physician Orders Details Patient Name: MILAROSE, GILSTER. Date of Service: 06/28/2021 1:15 PM Medical Record Number: [id number] Patient Account Number: [id number] Date of Birth/Sex: Apr 11, 1946 (75 y.o. F) Treating RN: Carlene Coria Primary Care Provider: [name] Other Clinician: Referring Provider: [name] Treating Provider/Extender: Yaakov Guthrie in  Treatment: 21 Verbal / Phone Orders: No Diagnosis Coding Follow-up Appointments Wound #13 Left,Distal,Posterior Lower Leg o Return Appointment in 1 week. o Nurse Visit as needed Bathing/ Shower/ Hygiene o May shower with wound dressing protected with water repellent cover or cast protector. Anesthetic (Use 'Patient Medications' Section for Anesthetic Order Entry) o Lidocaine applied to wound bed Edema Control - Lymphedema / Segmental Compressive Device / Other o Elevate, Exercise Daily and Avoid Standing for Long Periods of Time. o Elevate legs to the level of the heart and pump ankles as often as possible o Elevate leg(s) parallel to the floor when sitting. o Compression Pump: Use compression pump on left lower extremity for 60 minutes, twice daily. - TWICE DAILY for 1 hour o Compression Pump: Use compression pump on right lower extremity for 60 minutes, twice daily. - TWICE DAILY for 1 hour Wound Treatment Wound #13 - Lower Leg Wound Laterality: Left, Posterior, Distal Cleanser: Soap and Water 1 x Per Week/30 Days Discharge Instructions: Gently cleanse wound with antibacterial soap, rinse and pat dry prior to dressing wounds Topical: Triamcinolone Acetonide Cream, 0.1%, 15 (g) tube 1 x Per Week/30 Days Discharge Instructions: Peri wound Apply as directed by provider. Primary Dressing: Prisma 4.34 (in) 1 x Per Week/30 Days Discharge Instructions: Moisten w/normal saline or sterile water; Cover wound as directed. Do not remove from wound bed. Secondary Dressing: ABD Pad 5x9 (in/in) 1 x Per Week/30 Days Discharge Instructions: Cover with ABD pad Compression Wrap: Medichoice 4 layer Compression System, 35-40 mmHG 1 x Per Week/30 Days Discharge Instructions: Apply multi-layer wrap as directed. Wound #14 - Lower Leg Wound Laterality: Right Cleanser: Soap and Water 1 x Per Week/30 Days Discharge Instructions: Gently cleanse wound with antibacterial soap, rinse and pat dry  prior to dressing wounds Topical: Triamcinolone Acetonide Cream, 0.1%, 15 (g) tube 1 x Per Week/30 Days Discharge Instructions: Peri wound Apply as directed by provider. Secondary  Dressing: Non-Adherent Pad 3x8 (in/in) 1 x Per Week/30 Days Compression Wrap: coban 1 x Per Week/30 Days Electronic Signature(s) Signed: 06/28/2021 2:12:34 PM By: Kalman Shan DO Signed: 07/03/2021 1:47:40 PM By: Carlene Coria RN Entered By: Carlene Coria on 06/28/2021 14:00:33 KAEDYN, MACIAS ([id number]) VENITTA, BACKES ([id number]) -------------------------------------------------------------------------------- Problem List Details Patient Name: AYLEEN, AKARD. Date of Service: 06/28/2021 1:15 PM Medical Record Number: [id number] Patient Account Number: [id number] Date of Birth/Sex: Nov 09, 1945 (75 y.o. F) Treating RN: Carlene Coria Primary Care Provider: [name] Other Clinician: Referring Provider: [name] Treating Provider/Extender: Yaakov Guthrie in Treatment: 11 Active Problems ICD-10 Encounter Code Description Active Date MDM Diagnosis I87.332 Chronic venous hypertension (idiopathic) with ulcer and inflammation of 04/12/2021 No Yes left lower extremity I89.0 Lymphedema, not elsewhere classified 04/12/2021 No Yes L97.221 Non-pressure chronic ulcer of left calf limited to breakdown of skin 04/12/2021 No Yes S81.801A Unspecified open wound, right lower leg, initial encounter 05/31/2021 No Yes Inactive Problems Resolved Problems Electronic Signature(s) Signed: 06/28/2021 2:12:34 PM By: Kalman Shan DO Entered By: Kalman Shan on 06/28/2021 14:07:53 [name], [name] -------------------------------------------------------------------------------- Progress Note/History and Physical Details Patient Name: JELEAH, NAPOLEON. Date of Service: 06/28/2021 1:15 PM Medical Record Number: [id number] Patient Account Number: [id number] Date of Birth/Sex: December 17, 1945 (75 y.o.  F) Treating RN: Carlene Coria Primary Care Provider: [name] Other Clinician: Referring Provider: [name] Treating Provider/Extender: Yaakov Guthrie in Treatment: 11 Subjective Chief Complaint Information obtained from Patient Left calf venous stasis ulceration. 11/13/16; the patient is here for a left calf recurrent ulceration which is painful and has been present for the last month 01/22/17; the patient re-presents here for recurrent difficulties with blistering and leaking fluid on her left posterior calf 12/10/18; the patient returns to clinic for a wound on the left medial calf 04/12/2021; left lower extremity wound, 05/31/21; right leg wound History of Present Illness (HPI) Pleasant 75 year old with history of chronic venous insufficiency. No diabetes or peripheral vascular disease. Left ABI 1.29. Questionable history of left lower extremity DVT. She developed a recurrent ulceration on her left lateral calf in December 2015, which she attributes to poor diet and subsequent lower extremity edema. She underwent endovenous laser ablation of her left greater saphenous vein in 2010. She underwent laser ablation of accessory branch of left GSV in April 2016 by Dr. Kellie Simmering at [facility]. She was previously wearing Unna boots, which she tolerated well. Tolerating 2 layer compression and cadexomer iodine. She returns to clinic for follow-up and is without new complaints. She denies any significant pain at this time. She reports persistent pain with pressure. No claudication or ischemic rest pain. No fever or chills. No drainage. READMISSION 11/13/16; this is a 75 year old woman who is not a diabetic. She is here for a review of a painful area on her left medial lower extremity. I note that she was seen here previously last year for wound I believe to be in the same area. At that time she had undergone previously a left greater saphenous vein ablation by Dr. Kellie Simmering and she had a  ablation of the anterior accessory branch of the left greater saphenous vein in March 2016. Seeing that the wound actually closed over. In reviewing the history with her today the ulcer in this area has been recurrent. She describes a biopsy of this area in 2009 that only showed stasis physiology. She also has a history of today malignant melanoma in the right shoulder for which she follows with Dr. 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 Do not see that she has ever had arterial studies in the left leg. She tells me she has a follow-up with Dr. Kellie Simmering in roughly 10 days In any case she developed the reopening of this area roughly a month ago. On the background of this she describes rapidly increasing edema which has responded to Lasix 40 mg and metolazone 2.5 mg as well as the patient's lymph massage. She has been told she has both venous insufficiency and lymphedema but she cannot tolerate compression stockings 11/28/16; the patient saw Dr. Kellie Simmering recently. Per the patient he did arterial Dopplers in the office that did not show evidence of arterial insufficiency, per the patient he stated "treat this like an ordinary venous ulcer". She also saw her dermatologist Dr. Ronnald Ramp who felt that this was more of a vascular ulcer. In general things are improving although she arrives today with increasing bilateral lower extremity edema with weeping a deeper fluid through the wound on the left medial leg compatible with some degree of lymphedema 12/04/16; the patient's wound is fully epithelialized but I don't think fully healed. We will do another week of depression with Promogran and TCA however I suspect we'll be able to discharge her next week. This is a very unusual-looking wound which was initially a figure-of-eight type wound lying on its side surrounded by petechial like hemorrhage. She  has had venous ablation on this side. She apparently does not have an arterial issue per Dr. Kellie Simmering. She saw her dermatologist thought it was "vascular". Patient is definitely going to need ongoing compression and I talked about this with her today she will go to elastic therapy after she leaves here next week 12/11/16; the patient's wound is not completely closed today. She has surrounding scar tissue and in further discussion with the patient it would appear that she had ulcers in this area in 2009 for a prolonged period of time ultimately requiring a punch biopsy of this area that only showed venous insufficiency. I did not previously pickup on this part of the history from the patient. 12/18/16; the patient's wound is completely epithelialized. There is no open area here. She has significant bilateral venous insufficiency with secondary lymphedema to a mild-to-moderate degree she does not have compression stockings.. She did not say anything to me when I was in the room, she told our intake nurse that she was still having pain in this area. This isn't unusual recurrent small open area. She is going to go to elastic therapy to obtain compression stockings. 12/25/16; the patient's wound is fully epithelialized. There is no open area here. The patient describes some continued episodic discomfort in this area medial left calf. However everything looks fine and healed here. She is been to elastic therapy and caught herself 15-20 mmHg stockings, they apparently were having trouble getting 20-30 mm stockings in her size 01/22/17; this is a patient we discharged from the clinic a month ago. She has a recurrent open wound on her medial left calf. She had 15 mm support stockings. I told her I thought she needed 20-30 mm compression stockings. She tells me that she has been ill with hospitalization secondary to asthma and is been found to have severe hypokalemia likely secondary to a combination of Lasix and  metolazone. This morning she noted blistering and leaking fluid on the posterior part  of her left leg. She called our intake nurse urgently and we was saw her this afternoon. She has not had any real discomfort here. I don't know that she's been wearing any stockings on this leg for at least 2-3 days. ABIs in this clinic were 1.21 on the right and 1.3 on the left. She is previously seen vascular surgery who does not think that there is a peripheral arterial issue. 01/30/17; Patient arrives with no open wound on the left leg. She has been to elastic therapy and obtained 20-37mhg below knee stockings and she has one on the right leg today. READMISSION 02/19/18; this PMishoeis a now 75year old patient we've had in this clinic perhaps 3 times before. I had last looked at her from January 07 December 2016 with an area on the medial left leg. We discharged her on 12/25/16 however she had to be readmitted on 01/22/17 with a recurrence. I have in my notes that we discharged her on 20-30 mm stockings although she tells me she was only wearing support hose because she cannot get stockings on predominantly related to her cervical spine surgery/issues. She has [place] the left with an anterior accessory branch ablation I think both of these were in 2016. On one of the previous visit she had a biopsy noted 2009 that was negative. She is not felt to have an arterial issue. She is not a diabetic. She does have a PLYSHA, SCHER ([id number] history of obstructive sleep apnea hypertension asthma as well as chronic venous insufficiency and lymphedema. On this occasion she noted 2 dry scaly patch on her left leg. She tried to put lotion on this it didn't really help. There were 2 open areas.the patient has been seeing her primary physician from 02/05/18 through 02/14/18. She had Unna boots applied. The superior wound now on  the lateral left leg has closed but she's had one wound that remains open on the lateral left leg. This is not the same spot as we dealt with in 2018. ABIs in this clinic were 1.3 bilaterally 02/26/18; patient has a small wound on the left lateral calf. Dimensions are down. She has chronic venous insufficiency and lymphedema. 03/05/18; small open area on the left lateral calf. Dimensions are down. Tightly adherent necrotic debris over the surface of the wound which was difficult to remove. Also the dressing [over collagen] stuck to the wound surface. This was removed with some difficulty as well. Change the primary dressing to Hydrofera Blue ready 03/12/18; small open area on the left lateral calf. Comes in with tightly adherent surface eschar as well as some adherent Hydrofera Blue. 03/19/18; open area on the left lateral calf. Again adherent surface eschar as well as some adherent Hydrofera Blue nonviable subcutaneous tissue. She complained of pain all week even with the reduction from 4-3 layer compression I put on last week. Also she had an increase in her ankle and calf measurements probably related to the same thing. 03/26/18; open area on the left lateral calf. A very small open area remains here. We used silver alginate starting last week as the Hydrofera Blue seem to stick to the wound bed. In using 4-layer compression 04/02/18; the open area in the left lateral calf at some adherent slough which I removed there is no open area here. We are able to transition her into her own compression stocking. Truthfully I think this is  probably his support hose. However this does not maintain skin integrity will be limited. She cannot put over the toe compression stockings on because of neck problems hand problems etc. She is allergic to the lining layer of juxta lites. We might be forced to use extremitease stocking should this fail READMIT 11/24/2018 Patient is now a 75 year old woman who is not a diabetic.  She has been in this clinic on at least 3 previous occasions largely with recurrent wounds on her left leg secondary to chronic venous insufficiency with secondary lymphedema. Her situation is complicated by inability to get stockings on and an allergy to neoprene which is apparently a component and at least juxta lites and other stockings. As a result she really has not been wearing any stockings on her legs. She tells [place] that roughly 2 or 3 weeks ago she started noticing a stinging sensation just above her ankle on the left medial aspect. She has been diagnosed with pseudogout and she wondered whether this was what she was experiencing. She tried to dress this with something she bought at the store however subsequently it pulled skin off and now she has an open wound that is not improving. She has been using Vaseline gauze with a cover bandage. She saw her primary doctor last week who put an Haematologist on her. ABIs in this clinic was 1.03 on the left 2/12; the area is on the left medial ankle. Odd-looking wound with what looks to be surface epithelialization but a multitude of small petechial openings. This clearly not closed yet. We have been using silver alginate under 3 layer compression with TCA 2/19; the wound area did not look quite as good this week. Necrotic debris over the majority of the wound surface which required debridement. She continues to have a multitude of what looked to be small petechial openings. She reminds [place] that she had a biopsy on this initially during her first outbreak in 2015 in [place] [facility]. She expresses concern about this being a possible melanoma. 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 I assured her that this does not look anything like melanoma. Except for the petechial reaction it does look like a venous insufficiency area and she certainly has evidence of this  on both sides 2/26; a difficult area on the left medial ankle. The patient clearly has chronic venous hypertension with some degree of lymphedema. The odd thing about the area is the small petechial hemorrhages. I am not really sure how to explain this. This was present last time and this is not a compression injury. We have been using Hydrofera Blue which I changed to last week 3/4; still using Hydrofera Blue. Aggressive debridement today. She does not have known arterial issues. She has seen Dr. Kellie Simmering at [facility] vein [facility] vascular and and has an ablation on the left. [Anterior accessory branch of the greater saphenous]. From what I remember they did not feel she had an arterial issue. The patient has had this area biopsied in 2009 at [facility] and by her recollection they said this was "stasis". She is also follow-up with dermatology locally who thought that this was more of a vascular issue 3/11; using Hydrofera Blue. Aggressive debridement today. She does not have an arterial issue. We are using 3 layer compression although we may need to go to 4. The patient has been in for multiple changes to her wrap since I  last saw her a week ago. She says that the area was leaking. I do not have too much more information on what was found 01/19/19 on evaluation today patient was actually being seen for a nurse visit when unfortunately she had the area on her left lateral lower extremity as well as weeping from the right lower extremity that became apparent. Therefore we did end up actually seeing her for a full visit with myself. She is having some pain at this site as well but fortunately nothing too significant at this point. No fevers, chills, nausea, or vomiting noted at this time. 3/18-Patient is back to the clinic with the left leg venous leg ulcer, the ulcer is larger in size, has a surface that is densely adherent with fibrinous tissue, the Hydrofera Blue was used but is densely  adherent and there was difficulty in removing it. The right lower extremity was also wrapped for weeping edema. Patient has a new area over the left lateral foot above the malleolus that is small and appears to have no debris with intact surrounding skin. Patient is on increased dose of Lasix also as a means to edema management 3/25; the patient has a nonhealing venous ulcer on the medial left leg and last week developed a smaller area on the lateral left calf. We have been using Hydrofera Blue with a contact layer. 4/1; no major change in these wounds areas. Left medial and more recently left lateral calf. I tried Iodoflex last week to aid in debridement she did not tolerate this. She stated her pain was terrible all week. She took the top layer of the 4 layer compression off. 4/8; the patient actually looks somewhat better in terms of her more prominent left lateral calf wound. There is some healthy looking tissue here. She is still complaining of a lot of discomfort. 4/15; patient in a lot of pain secondary to sciatica. She is on a prednisone taper prescribed by her primary physician. She has the 2 areas one on the left medial and more recently a smaller area on the left lateral calf. Both of these just above the malleoli 4/22; her back pain is better but she still states she is very uncomfortable and now feels she is intolerant to the [employer]. No real change in the wounds we have been using Sorbact. She has been previously intolerant to Iodoflex. There is not a lot of option about what we can use to debride this wound under compression that she no doubt needs. sHe states Ultram no longer works for her pain 4/29; no major change in the wounds slightly increased depth. Surface on the original medial wound perhaps somewhat improved however the more recent area on the lateral left ankle is 100% covered in very adherent debris we have been using Sorbact. She tolerates 4 layer compression well and  her edema control is a lot better. She has not had to come in for a nurse check 5/6; no major change in the condition of the wounds. She did consent to debridement today which was done with some difficulty. Continuing Sorbact. She did not tolerate Iodoflex. She was in for a check of her compression the day after we wrapped her last week this was adjusted but TAMMY, SAYNE. ([id number]) nothing much was found 5/13; no major change in the condition or area of the wounds. I was able to get a fairly aggressive debridement done on the lateral left leg wound. Even using Sorbact under compression. She came  back in on Friday to have the wrap changed. She says she felt uncomfortable on the lateral aspect of her ankle. She has a long history of chronic venous insufficiency including previous ablation surgery on this side. 5/20-Patient returns for wounds on left leg with both wounds covered in slough, with the lateral leg wound larger in size, she has been in 3 layer compression and felt more comfortable, she describes pain in ankle, in leg and pins and needles in foot, and is about to try Pamelor for this 6/3; wounds on the left lateral and left medial leg. The area medially which is the most recent of the 2 seems to have had the largest increase in dimensions. We have been using Sorbac to try and debride the surface. She has been to see orthopedics they apparently did a plain x-ray that was indeterminant. Diagnosed her with neuropathy and they have ordered an MRI to determine if there is underlying osteomyelitis. This was not high on my thought list but I suppose it is prudent. We have advised her to make an appointment with vein and vascular in [place]. She has a history of a left greater saphenous and accessory vein ablations I wonder if there is anything else that can be done from a surgical point of view to help in these difficult refractory wounds. We have previously healed this wound on one occasion  but it keeps on reopening [medial side] 6/10; deep tissue culture I did last week I think on the left medial wound showed both moderate E. coli and moderate staph aureus [MSSA]. She is going to require antibiotics and I have chosen Augmentin. We have been using Sorbact and we have made better looking wound surface on both sides but certainly no improvement in wound area. She was back in last Friday apparently for a dressing changes the wrap was hurting her outer left ankle. She has not managed to get a hold of vein and vascular in [place]. We are going to have to make her that appointment 6/17; patient is tolerating the Augmentin. She had an MRI that I think was ordered by orthopedic surgeon this did not show osteomyelitis or an abscess did suggest cellulitis. We have been using Sorbact to the lateral and medial ankles. We have been trying to arrange a follow-up appointment with vein and vascular in [place] or did her original ablations. We apparently an area sent the request to vein and vascular in [facility] 6/24; patient has completed the Augmentin. We do not yet have a vein and vascular appointment in [place]. I am not sure what the issue is here we have asked her to call tomorrow. We are using Sorbact. Making some improvements and especially the medial wound. Both surfaces however look better medial and lateral. 7/1; the patient has been in contact with vein and vascular in [place] but has not yet received an appointment. Using Sorbact we have gradually improve the wound surface with no improvement in surface area. She is approved for Apligraf but the wound surface still is not completely viable. She has not had to come in for a dressing change 7/8; the patient has an appointment with vein and vascular on 7/31 which is a Friday afternoon. She is concerned about getting back here for [place] to dress her wounds. I think it is important to have them goal for her venous reflux/history of  ablations etc. to see if anything else can be done. She apparently tested positive for 1 of the blood tests with  regards to lupus and saw a rheumatologist. He has raised the issue of vasculitis again. I have had this thought in the past however the evidence seems overwhelming that this is a venous reflux etiology. If the rheumatologist tells me there is clinical and laboratory investigation is positive for lupus I will rethink this. 7/15; the patient's wound surfaces are quite a bit better. The medial area which was her original wound now has no depth although the lateral wound which was the more recent area actually appears larger. Both with viable surfaces which is indeed better. Using Sorbact. I wanted to use Apligraf on her however there is the issue of the vein and vascular appointment on 7/31 at 2:00 in the afternoon which would not allow her to get back to be rewrapped and they would no doubt remove the graft 7/22; the patient's wound surfaces have moderate amount of debris although generally look better. The lateral one is larger with 2 small satellite areas superiorly. We are waiting for her vein and vascular appointment on 7/31. She has been approved for Apligraf which I would like to use after th 7/29; wound surfaces have improved no debridement is required we have been using Sorbact. She sees vein and vascular on Friday with this so question of whether anything can be done to lessen the likelihood of recurrence and/or speed the healing of these areas. She is already had previous ablations. She no doubt has severe venous hypertension 8/5-Patient returns at 1 week, she was in [place] for 3 days by her podiatrist, we have been using so backed to the wound, she has increased pain in both the wounds on the left lower leg especially the more distal one on the lateral aspect 8/12-Patient returns at 1 week and she is agreeable to having debridement in both wounds on her left leg today. We have  been using Sorbact, and vascular studies were reviewed at last visit 8/19; the patient arrives with her wounds fairly clean and no debridement is required. We have used Sorbact which is really done a nice job in cleaning up these very difficult wound surfaces. The patient saw Dr. Donzetta Matters of vascular surgery on 7/31. He did not feel that there was an arterial component. He felt that her treated greater saphenous vein is adequately addressed and that the small saphenous vein did not appear to be involved significantly. She was also noted to have deep venous reflux which is not treatable. Dr. Donzetta Matters mentioned the possibility of a central obstructive component leading to reflux and he offered her central venography. She wanted to discuss this or think about it. I have urged her to go ahead with this. She has had recurrent difficult wounds in these areas which do heal but after months in the clinic. If there is anything that can be done to reduce the likelihood of this I think it is worth it. 9/2 she is still working towards getting follow-up with Dr. Donzetta Matters to schedule her CT. Things are quite a bit worse venography. I put Apligraf on 2 weeks ago on both wounds on the medial and lateral part of her left lower leg. She arrives in clinic today with 3 superficial additional wounds above the area laterally and one below the wound medially. She describes a lot of discomfort. I think these are probably wrapped injuries. Does not look like she has cellulitis. 07/20/2019 on evaluation today patient appears to be doing somewhat poorly in regard to her lower extremity ulcers. She in fact  showed signs of erythema in fact we may even be dealing with an infection at this time. Unfortunately I am unsure if this is just infection or if indeed there may be some allergic reaction that occurred as a result of the Apligraf application. With that being said that would be unusual but nonetheless not impossible in this patient is one  who is unfortunately allergic to quite a bit. Currently we have been using the Sorbact which seems to do as well as anything for her. I do think we may want to obtain a culture today to see if there is anything showing up there that may need to be addressed. 9/16; noted that last week the wounds look worse in 1 week follow-up of the Apligraf. Using Sorbact as of 2 days ago. She arrives with copious amounts of drainage and new skin breakdown on the back of the left calf. The wounds arm more substantial bilaterally. There is a fair amount of swelling in the left calf no overt DVT there is edema present I think in the left greater than right thigh. She is supposed to go on 9/28 for CT venography. The wounds on the medial and lateral calf are worse and she has new skin breakdown posteriorly at least new for me. This is almost developing into a circumferential wound area The Apligraf was taken off last week which I agree with things are not going in the right direction a culture was done we do not have that back yet. She is on Augmentin that she started 2 days ago 9/23; dressing was changed by her nurses on Monday. In general there is no improvement in the wound areas although the area looks less angry than last week. She did get Augmentin for MSSA cultured on the 14th. She still appears to have too much swelling in the left leg even with 3 layer compression 9/30; the patient underwent her procedure on 9/28 by Dr. Donzetta Matters at vascular and vein specialist. She was discovered to have the common iliac vein measuring 12.2 mm but at the level of L4-L5 measured 3 mm. After stenting it measured 10 mm. 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 Rouleaux flow in the common femoral and femoral vein was observed much improved after stenting. We are using silver alginate to the wounds on the medial and lateral ankle on the left. 4 layer compression [name], [name] 10/7; the patient had fluid  swelling around her knee and 4 layer compression. At the advice of vein and vascular this was reduced to 3 layer which she is tolerating better. We have been using silver alginate under 3 layer compression since last Friday 10/14; arrives with the areas on the left ankle looking a lot better. Inflammation in the area also a lot better. She came in for a nurse check on 10/9 10/21; continued nice improvement. Slight improvements in surface area of both the medial and lateral wounds on the left. A lot of the satellite lesions in the weeping erythema around these from stasis dermatitis is resolved. We have been using silver alginate 10/28; general improvement in the entire wound areas although not a lot of change in dimensions the wound certainly looks better. There is a lot less in terms of venous inflammation. Continue silver alginate this week however look towards Hydrofera Blue next week 11/4; very adherent debris on the medial wound left wound is not as bad. We have been using silver alginate. Change to [facility] today  11/11; very adherent debris on both wound areas. She went to vein and vascular last week and follow-up they put in [place] boot on this today. He says the [facility] was adherent. Wound is definitely not as good as last week. Especially on the left there the satellite lesions look more prominent 11/18; absolutely no better. erythema on lateral aspect with tenderness. 09/30/2019 on evaluation today patient appears to actually be doing better. Dr. Dellia Nims did put her on doxycycline last week which I do believe has helped her at this point. Fortunately there is no signs of active infection at this time. No fevers, chills, nausea, vomiting, or diarrhea. I do believe he may want extend the doxycycline for 7 additional days just to ensure everything does completely cleared up the patient is in agreement with that plan. Otherwise she is going require some sharp debridement today 12/2;  patient is completing a 2-week course of doxycycline. I gave her this empirically for inflammation as well as infection when I last saw her 2 weeks ago. All of this seems to be better. She is using silver alginate she has the area on the medial aspect of the larger area laterally and the 2 small satellite regions laterally above the major wound. 12/9; the patient's wound on the left medial and left lateral calf look really quite good. We have been using silver alginate. She saw vein and vascular in follow-up on 10/09/2019. She has had a previous left greater saphenous vein ablation by Dr. Oscar La in 2016. More recently she underwent a left common iliac vein stent by Dr. 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 The swelling is improved and certainly the wounds have improved. The patient shows [place] today area on the right medial calf there is almost no wound but leaking lymphedema. She says she start this started 3 or 4 days ago. She did not traumatize it. It is not painful. She does not wear compression on that side 12/16; the patient continues to do well laterally. Medially still requiring debridement. The area on the right calf did not materialize to anything and is not currently open. We wrapped this last time. She has support stockings for that leg although I am not sure they are going to provide adequate compression 12/23; the lateral wound looks stable. Medially still requiring debridement for tightly adherent fibrinous debris. We've been using silver alginate. Surface area not any different 12/30; neither wound is any better with regards to surface and the area on the left lateral is larger. I been using silver alginate to the left lateral which look quite good last week and Sorbact to the left medial 11/11/2019. Lateral wound area actually looks better and somewhat smaller. Medial still requires a very aggressive debridement today. We have been using Sorbact on both wound areas 1/13; not  much better still adherent debris bilaterally. I been using Sorbact. She has severe venous hypertension. Probably some degree of dermal fibrosis distally. I wonder whether tighter compression might help and I am going to try that today. We also need to work on the bioburden 1/20; using Sorbact. She has severe venous hypertension status post stent placement for pelvic vein compression. We applied gentamicin last time to see if we could reduce bioburden I had some discussion with her today about the use of pentoxifylline. This is occasionally used in this setting for wounds with refractory venous insufficiency. However this interacts with Plavix. She tells me that she was put on this after stent placement  for 3 months. She will call Dr. Claretha Cooper office to discuss 1/27; we are using gentamicin under Sorbact. 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 She has a stent. Wound medially is measuring smaller this week. Laterally measuring slightly larger although she has some satellite lesions superiorly 2/3; gentamicin under Sorbact under 4-layer compression. 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 She has a stent on Plavix. Her wounds are measuring smaller this week. More substantially laterally where there is a satellite lesion superiorly. 2/10; gentamicin under Sorbac. 4-layer compression. Patient communicated with Dr. Donzetta Matters at vein and vascular in [place]. He is okay with the patient coming off Plavix I will therefore start her on pentoxifylline for a 1 month trial. In general her wounds look better today. I had some concerns about swelling in the left thigh however she measures 61.5 on the right and 63 on the mid thigh which does not suggest there is any difficulty. The patient is not describing any pain. 2/17; gentamicin under Sorbac 4-layer compression. She has been on pentoxifylline for 1 week and complains of loose stool. No nausea she  is eating and drinking well 2/24; the patient apparently came in 2 days ago for a nurse visit when her wrap fell down. Both areas look a little worse this week macerated medially and satellite lesions laterally. Change to silver alginate today 3/3; wounds are larger today especially medially. She also has more swelling in her foot lower leg and I even noted some swelling in her posterior thigh which is tender. I wonder about the patency of her stent. Fortuitously she sees Dr. Claretha Cooper group on Friday 3/10; Mrs. Wargel was seen by vein and vascular on 3/5. The patient underwent ultrasound. There was no evidence of thrombosis involving the IVC no evidence of thrombosis involving the right common iliac vein there is no evidence of thrombosis involving the right external iliac vein the left external vein is also patent. The right common iliac vein stent appears patent bilateral common femoral veins are compressible and appear patent. I was concerned about the left common iliac stent however it looks like this is functional. She has some edema in the posterior thigh that was tender she still has that this week. I also note they had trouble finding the pulses in her left foot and booked her for an ABI baseline in 4 weeks. She will follow up in 6 months for repeat IVC duplex. The patient stopped the pentoxifylline because of diarrhea. It does not look like that was being effective in any case. I have advised her to go back on her aspirin 81 mg tablet, vascular it also suggested this 3/17; comes in today with her wound surfaces a lot better. The excoriations from last week considerably better probably secondary to the TCA. We have been using silver alginate 3/24; comes in today with smaller wounds both medially and laterally. Both required debridement. There are 2 small satellite areas superiorly laterally. She also has a very odd bandlike area in the mid calf almost looking like there was a weakness in the  wrap in a localized area. I would write this off as being this however anteriorly she has a small raised ballotable area that is very tender almost reminiscent of an abscess but there was no [name], [name]. ([id number]) obvious purulent surface to it. 02/04/20 upon evaluation today patient appears to be doing fairly well in regard to her wounds today. Fortunately there is no signs of active infection at  this time. No fevers, chills, nausea, vomiting, or diarrhea. She has been tolerating the dressing changes without complication. Fortunately I feel like she is showing signs of improvement although has been sometime since have seen her. Nonetheless the area of concern that Dr. Dellia Nims had last week where she had possibly an area of the wrap that was we can allow the leg to bulge appears to be doing significantly better today there is no signs of anything worsening. 4/7; the patient's wounds on her medial and lateral left leg continue to contract. We have been using a regular alginate. Last week she developed an area on the right medial lower leg which is probably a venous ulcer as well. 4/14; the wounds on her left medial and lateral lower leg continue to contract. Surface eschar. We have been using regular alginate. The area on the right medial lower leg is closed. We have been putting both legs under 4-layer contraction. The patient went back to see vein and vascular she had arterial studies done which were apparently "quite good" per the patient although I have not read their notes I have never felt she had an arterial issue. 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 This is been longstanding and refractory to exercise, leg elevation and longstanding use of compression wraps in our clinic as well as compression stockings on the times we have been able to get these to heal 4/21; we thought she actually might be close this week however she arrives in clinic  with a lot of edema in her upper left calf and into her posterior thigh. This is been an intermittent problem here. She says the wrap fell down but it was replaced with a nurse visit on Monday. We are using calcium alginate to the wounds and the wound sizes there not terribly larger than last week but there is a lot more edema 4/28; again wound edges are smaller on both sides. Her edema is better controlled than last time. She is obtained her compression pumps from [employer] although they have not been to her home to set these up. 5/5; left medial and left lateral both look stable. I am not sure the medial is any smaller. We have been using calcium alginate under 4-layer compression. She had an area on the right medial. This was eschared today. We have been wrapping this as well. She does not tolerate external compression stockings due to a history of various contact allergies. She has her compression pumps however the representative from the company is coming on her to show her how to use these tomorrow 5/19; patient with severe chronic venous insufficiency secondary to central venous disease. She had a stent placed in her left common iliac vein. She has done better since but still difficult to control wounds. She comes in today with nothing open on the right leg. Her areas on the left medial and left lateral are just about closed. We are using calcium alginate under 4-layer compression. She is using her external compression pumps at home She only has 15-20 support stockings. States she cannot get anything tighter than that on. 03/30/20-Patient returns at 1 week, the wounds on the left leg are both slightly bigger, the last week she was on 3 layer compression which started to slide down. She is starting to use her lymphedema pumps although she stated on 1 day her right ankle started to swell up and she have to stop that day. Unfortunately the open area seem  to oscillate between improving to  the point of healing and then flaring up all to do with effectiveness of compression or lack of due to the left leg topography not keeping the compression wraps from rolling down 6/2 patient comes in with a 15/20 mmHg stocking on the right leg. She tells me that she developed a lot of swelling in her ankles she saw orthopedics she was felt to possibly be having a flare of pseudogout versus some other type of arthritis. She was put on steroids for a respiratory issue so that helps with the inflammation. She has not been using the pumps all week. She thinks the left thigh is more swollen than usual and I would agree with that. She has an appointment with Dr. Donzetta Matters 9 days or so from now 6/9; both wounds on the left medial and left lateral are smaller. We have been using calcium alginate under compression. She does not have an open wound on the right leg she is using a stocking and her compression pumps things are going well. She has an appointment with Dr. Donzetta Matters with regards to her stent in the left common iliac vein 6/16; the wounds on the left medial and left lateral ankle continues to contract. The patient saw Dr. Donzetta Matters and I think he seems satisfied. Ordered follow-up venous reflux studies on both sides in September. Cautioned that she may need thigh-high stockings. She has been using calcium alginate under compression on the left and her own stocking on the right leg. She tells [place] there are no open wounds on the right 6/23; left lateral is just about closed. Medial required debridement today. We have been using calcium alginate. Extensive discussion about the compression pumps she is only using these on 25 mmHg states she could not take 40 or 30 when the wrap came out to her home to demonstrate these. He said they should not feel tight 6/30; the left lateral wound has a slight amount of eschar. . The area medially is about the same using Hydrofera Blue. 7/7; left lateral wound still has some eschar. I  will remove this next week may be closed. The area medially is very small using Hydrofera Blue with improvement. Unfortunately the stockings fell down. Unfortunately the blisters have developed at the edge of where the wrap fell. When this happened she says her legs hurt she did not use her pumps. We are not open Monday for her to come in and change the wraps and she had an appointment yesterday. She also tells me that she is going to have an MRI of her back. She is having pain radiating into her left anterior leg she thinks her from an L5 disc. She saw Dr. Ellene Route of neurosurgery 7/14; the area on the left lateral ankle area is closed. Still a small area medially however it looks better as well. We have been using Hydrofera Blue under 4-layer compression 7/21; left lateral ankle is still closed however her wound on the medial left calf is actually larger. This is probably because Hydrofera Blue got stuck to the wound. She came in for a nurse change on Friday and will do that again this week I was concerned about the amount of swelling that she had last week however she is using her compression pumps twice a day and the swelling seems well controlled 7/28; remaining wound on the left medial lower leg is smaller. We have been using moistened silver collagen under compression she is coming back for a  nurse visit. For reasons that were not really clear she was just keeping her legs elevated and not using her compression pumps. I have asked her to use the compression pumps. She does not have any wounds on the right leg 06/15/20-Patient returns at 2 weeks, her LLE edema is worse and she developed a blister wound that is new and has bigger posterior calf wound on right, we are using Prisma with pad, 4 layer compression. she has been on lasix 40 mg daily 8/18; patient arrives today with things a lot worse than I remember from a few weeks ago. She was seen last week. Noted that her edema was worse and that  she now had a left lateral wound as well as deteriorating edema in the medial and posterior part of the lower leg. She says she is using his or her external compression pumps once a day although I wonder about the compliance. 8/25; weeping area on the right medial lower leg. This had actually gotten a small localized area of her compression stocking wet. On the left side there is a large denuded area on the posterior medial lower leg and smaller area on the lateral. This was not the original areas that we dealt with. 9/1 the patient's wound on the left leg include the left lateral and left posterior. Larger superficial wounds weeping. She has very poor edema control. Tender localized edema in the left lower medial ankle/heel probably because of localized wrap issues. She freely admits she is not using [place], [place] ([id number]) the compression pumps. She has been up on her feet a lot. She thinks the hydrofera blue is contributing to the pain she is experiencing.. This is a complaint that I have occasionally heard 9/8; really not much improvement. The patient is still complaining of a lot of pain particularly when she uses compression pumps. I switched her to silver alginate last time because she found the Hydrofera Blue to be irritating. I don't hear much difference in her description with the silver alginate. 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 She has follow-up with Dr. Donzetta Matters I think for ultrasound next month 9/15; quite a bit of improvement today. We have less edema and more epithelialization in both of her wound areas on the left medial and left lateral calf. These are not the site of her original wounds in this area. She says she has been using her compression pumps for 30 minutes twice a day, there pain issues that never quite understood. Silver alginate as the primary dressing 9/22; continued improvement.  Both areas medially and laterally still have a small open area there is some eschar. She continues to complain of left medial ankle pain. Swelling in the leg is in much better condition. We have been using silver alginate 9/29; continued improvement. Both areas medially and laterally in the left calf look as though they are close some minor surface eschar but I think this is epithelialized. She comes in today saying she has a ruptured disc at L4-L5 cannot bend over to put on her stockings. 10/6; patient comes in today with no open wounds on either leg. However her edema on the left leg in the upper one third of the lower leg is poorly controlled nonpitting. She says that she could not use the pumps for 2 days and then she has been using the last couple of days. It is not clear  to me she has been able to get her stocking on. She has back problems. Mrs. Kearley has severe chronic venous insufficiency with secondary lymphedema. Her venous insufficiency is partially centrally mediated and that she is now post stent in the left common iliac vein. [facility] Thurner's syndrome/physiology]. She follows up with them on 10/15. She wears 20/30 below-knee stockings. She is supposed to use compression pumps at home although I think her compliance about with this is been less than 100%. I have asked her to use these 3 times a day. Finally I think she has lipodermatosclerosis in the left lower leg with an inverted bottle sign. It is been a major problem controlling the edema in the left leg. The right leg we have had wounds on but not as significant a problem is on the left READMISSION 04/12/2021 [name] is a 75 year old woman we know well in this clinic. She has severe chronic venous insufficiency. 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 I believe she has had bilateral greater saphenous vein ablation in the past as well. She tells me that this wound opened sometime in March.  She had a fall and thinks it was initially abrasion. She developed areas she describes as little blisters on the anterior part of her leg and she saw dermatology and was treated for methicillin staph aureus with several rounds of antibiotics. She has been using support stockings on the left leg and says this is the only thing she can get on. Her compression pump use maybe once a day she says if she did not use one she use the other. She comes in today with incredible swelling in the left leg with a wound on the left posterior calf. She has been using Neosporin to this previously a hydrocolloid. 6/15; patient arrives back for 1 week follow-up.Marland Kitchen Apparently her wrap fell down she did not call us to replace this. He has poor edema control. She only uses her compression pumps once a day 6/29; patient presents for 1 week follow-up. She has tolerated the compression wrap well. We have been using Iodoflex under the wrap. She has no issues or complaints today. 7/6; patient presents for 1 week follow-up. She states that the compression wrap rolled down her leg 4 days ago. She has been trying to keep the area covered but has no dressings at home to use. She denies signs of infection. 7/13; patient presents for 1 week follow-up. She states that her compression wrap rolled down her right leg and she called our office and had it placed a few days ago. She has been tolerating the current wrap well. She states that the Iodoflex is causing a burning sensation. She denies signs of infection. 7/27; patient presents for 1 week follow-up She has tolerated the compression wrap well with Hydrofera Blue underneath. She has now developed a wound to her right lower extremity. She reports having a culture done To this area by her dermatologist that she reports is negative. She currently denies signs of infection. 8/30; patient presents for 1 week follow-up. On the left side she has tolerated the compression wrap well with  Hydrofera Blue underneath. She reports some discomfort to her right lower extremity with the 3 layer compression. She currently denies signs of infection. 06/14/2021 upon evaluation today patient's wound actually appears to be doing decently well based on what I am seeing currently. She actually has 2 areas 1 on the left distal/posterior lower leg  and the other on the right lower leg. Subsequently the measurements are roughly the same may be slightly smaller but in general have not made any trend towards getting worse which is great news. 06/23/2021 upon evaluation today patient appears to be doing pretty well in regard to her wounds. On the right she is having difficulty with sciatic pain and this subsequently has led to her taking the wrap off over the last week her leg is more swollen she is also been on prednisone for 10 days. With that being said I think we may want to just use a compression sock on the left that way she will not have to worry about the compression wrap being in place that she cannot get off. With that being said I do believe as well that the patient is going to need to continue with the wrapping on the left we will also need to do a little bit of sharp debridement today. 8/24; patient presents for 1 week follow-up. She has no issues or complaints today. She denies signs of infection. Patient History Information obtained from Patient. Family History Cancer - Mother,Paternal Grandparents, Hypertension - Mother,Father, Kidney Disease - Paternal Grandparents, Lung Disease - Paternal Grandparents, No family history of Diabetes, Heart Disease, Hereditary Spherocytosis, Seizures, Stroke, Thyroid Problems, Tuberculosis. Social History Former smoker - quit at age 40 - ended on 04/19/1966, Marital Status - Divorced, Alcohol Use - Never, Drug Use - No History, Caffeine Use - Daily. Medical History Eyes CHELESE, LANDERS ([id number]) Patient has history of Cataracts Denies history of  Glaucoma, Optic Neuritis Ear/Nose/Mouth/Throat Denies history of Chronic sinus problems/congestion, Middle ear problems Hematologic/Lymphatic Denies history of Anemia, Hemophilia, Human Immunodeficiency Virus, Lymphedema, Sickle Cell Disease Respiratory Patient has history of Asthma, Sleep Apnea Denies history of Aspiration, Chronic Obstructive Pulmonary Disease (COPD), Pneumothorax, Tuberculosis Cardiovascular Patient has history of Deep Vein Thrombosis - LLE 2010, Hypertension, Peripheral Venous Disease Denies history of Angina, Arrhythmia, Congestive Heart Failure, Coronary Artery Disease, Hypotension, Myocardial Infarction, Peripheral Arterial Disease, Phlebitis, Vasculitis Gastrointestinal Denies history of Cirrhosis , Colitis, Crohn s, Hepatitis A, Hepatitis B, Hepatitis C Endocrine Denies history of Type I Diabetes, Type II Diabetes Genitourinary Denies history of End Stage Renal Disease Immunological Denies history of Lupus Erythematosus, Raynaud s, Scleroderma Integumentary (Skin) Denies history of History of Burn, History of pressure wounds Musculoskeletal Patient has history of Osteoarthritis - neck Denies history of Gout, Rheumatoid Arthritis, Osteomyelitis Neurologic Denies history of Dementia, Neuropathy, Quadriplegia, Paraplegia, Seizure Disorder Oncologic Patient has history of Received Chemotherapy - interfeon immunotherapy, Received Radiation Psychiatric Denies history of Anorexia/bulimia, Confinement Anxiety Hospitalization/Surgery History - Melanoma left shoulder. Medical And Surgical History Notes Constitutional Symptoms (General Health) Vein ablation LLE 2010 Vascular Sx ( vein ablationo) LLE 02/2015 Eyes Horners syndrome Genitourinary Kidney stones Neurologic ct scan showed swollen lymph nodes Oncologic Melanoma (R) shoulder 07/2010; (R) axillary lymphnode removal Objective Constitutional Vitals Time Taken: 1:22 PM, Height: 63 in, Weight: 212 lbs,  BMI: 37.6, Temperature: 98.4 F, Pulse: 89 bpm, Respiratory Rate: 18 breaths/min, Blood Pressure: 181/83 mmHg. General Notes: Left lower extremity: To the distal medial aspect there is an open wound with granulation tissue throughout with scant nonviable tissue. No obvious signs of infection. Right lower extremity: To the distal medial aspect there is some dried lymphedema fluid but no obvious wounds. Integumentary (Hair, Skin) Wound #13 status is Open. Original cause of wound was Gradually Appeared. The date acquired was: 02/24/2021. The wound has been in treatment 11 weeks. The wound is  located on the Left,Distal,Posterior Lower Leg. The wound measures 1.7cm length x 1.5cm width x 0.2cm depth; 2.003cm^2 area and 0.401cm^3 volume. There is Fat Layer (Subcutaneous Tissue) exposed. There is no tunneling or undermining noted. There is a medium amount of serosanguineous drainage noted. There is medium (34-66%) red, pink granulation within the wound bed. There is a medium (34-66%) amount of necrotic tissue within the wound bed including Adherent Slough. Wound #14 status is Open. Original cause of wound was Gradually Appeared. The date acquired was: 05/05/2021. The wound has been in treatment 4 weeks. The wound is located on the Right Lower Leg. The wound measures 0.1cm length x 0.1cm width x 0.1cm depth; 0.008cm^2 area and Hiott, Timia J. ([id number]) 0.001cm^3 volume. There is no tunneling or undermining noted. There is a none present amount of drainage noted. There is no granulation within the wound bed. There is no necrotic tissue within the wound bed. Assessment Active Problems ICD-10 Chronic venous hypertension (idiopathic) with ulcer and inflammation of left lower extremity Lymphedema, not elsewhere classified Non-pressure chronic ulcer of left calf limited to breakdown of skin Unspecified open wound, right lower leg, initial encounter Patient's left lower extremity wound is stable. No signs  of infection on exam. I recommended continuing with compression therapy and collagen. To the right lower extremity there are no obvious wounds but she has benefited from triamcinolone cream so I recommended continuing this. She can wear her compression stocking To this leg. Procedures Wound #13 Pre-procedure diagnosis of Wound #13 is a Venous Leg Ulcer located on the Left,Distal,Posterior Lower Leg . There was a Four Layer Compression Therapy Procedure by Carlene Coria, RN. Post procedure Diagnosis Wound #13: Same as Pre-Procedure Plan Follow-up Appointments: Wound #13 Left,Distal,Posterior Lower Leg: Return Appointment in 1 week. Nurse Visit as needed Bathing/ Shower/ Hygiene: May shower with wound dressing protected with water repellent cover or cast protector. Anesthetic (Use 'Patient Medications' Section for Anesthetic Order Entry): Lidocaine applied to wound bed Edema Control - Lymphedema / Segmental Compressive Device / Other: Elevate, Exercise Daily and Avoid Standing for Long Periods of Time. Elevate legs to the level of the heart and pump ankles as often as possible Elevate leg(s) parallel to the floor when sitting. Compression Pump: Use compression pump on left lower extremity for 60 minutes, twice daily. - TWICE DAILY for 1 hour Compression Pump: Use compression pump on right lower extremity for 60 minutes, twice daily. - TWICE DAILY for 1 hour WOUND #13: - Lower Leg Wound Laterality: Left, Posterior, Distal Cleanser: Soap and Water 1 x Per Week/30 Days Discharge Instructions: Gently cleanse wound with antibacterial soap, rinse and pat dry prior to dressing wounds Topical: Triamcinolone Acetonide Cream, 0.1%, 15 (g) tube 1 x Per Week/30 Days Discharge Instructions: Peri wound Apply as directed by provider. Primary Dressing: Prisma 4.34 (in) 1 x Per Week/30 Days Discharge Instructions: Moisten w/normal saline or sterile water; Cover wound as directed. Do not remove from wound  bed. Secondary Dressing: ABD Pad 5x9 (in/in) 1 x Per Week/30 Days Discharge Instructions: Cover with ABD pad Compression Wrap: Medichoice 4 layer Compression System, 35-40 mmHG 1 x Per Week/30 Days Discharge Instructions: Apply multi-layer wrap as directed. WOUND #14: - Lower Leg Wound Laterality: Right Cleanser: Soap and Water 1 x Per Week/30 Days Discharge Instructions: Gently cleanse wound with antibacterial soap, rinse and pat dry prior to dressing wounds Topical: Triamcinolone Acetonide Cream, 0.1%, 15 (g) tube 1 x Per Week/30 Days Discharge Instructions: Peri wound Apply as directed by  provider. Secondary Dressing: Non-Adherent Pad 3x8 (in/in) 1 x Per Week/30 Days Compression Wrap: coban 1 x Per Week/30 Days JAYANI, BEGNOCHE. ([id number]) 1. Continue collagen under compression wrap to the left lower extremity 2. Continue triamcinolone cream under compression stocking 3. Follow-up in 1 week Electronic Signature(s) Signed: 06/28/2021 2:12:34 PM By: Kalman Shan DO Entered By: Kalman Shan on 06/28/2021 14:11:45 Mesler, [name] -------------------------------------------------------------------------------- ROS/PFSH Details Patient Name: MILADY, BETHARDS. Date of Service: 06/28/2021 1:15 PM Medical Record Number: [id number] Patient Account Number: [id number] Date of Birth/Sex: 05/09/1946 (75 y.o. F) Treating RN: Carlene Coria Primary Care Provider: [name] Other Clinician: Referring Provider: [name] Treating Provider/Extender: Yaakov Guthrie in Treatment: 11 Label Progress Note Print Version as History and Physical for this encounter Information Obtained From Patient Constitutional Symptoms (General Health) Medical History: Past Medical History Notes: Vein ablation LLE 2010 Vascular Sx ( vein ablationo) LLE 02/2015 Eyes Medical History: Positive for: Cataracts Negative for: Glaucoma; Optic Neuritis Past Medical History  Notes: Horners syndrome Ear/Nose/Mouth/Throat Medical History: Negative for: Chronic sinus problems/congestion; Middle ear problems Hematologic/Lymphatic Medical History: Negative for: Anemia; Hemophilia; Human Immunodeficiency Virus; Lymphedema; Sickle Cell Disease Respiratory Medical History: Positive for: Asthma; Sleep Apnea Negative for: Aspiration; Chronic Obstructive Pulmonary Disease (COPD); Pneumothorax; Tuberculosis Cardiovascular Medical History: Positive for: Deep Vein Thrombosis - LLE 2010; Hypertension; Peripheral Venous Disease Negative for: Angina; Arrhythmia; Congestive Heart Failure; Coronary Artery Disease; Hypotension; Myocardial Infarction; Peripheral Arterial Disease; Phlebitis; Vasculitis Gastrointestinal Medical History: Negative for: Cirrhosis ; Colitis; Crohnos; Hepatitis A; Hepatitis B; Hepatitis C Endocrine Medical History: Negative for: Type I Diabetes; Type II Diabetes Genitourinary Medical History: Negative for: End Stage Renal Disease Past Medical History Notes: Kidney stones PAVIELLE, WOFFORD ([id number]) Immunological Medical History: Negative for: Lupus Erythematosus; Raynaudos; Scleroderma Integumentary (Skin) Medical History: Negative for: History of Burn; History of pressure wounds Musculoskeletal Medical History: Positive for: Osteoarthritis - neck Negative for: Gout; Rheumatoid Arthritis; Osteomyelitis Neurologic Medical History: Negative for: Dementia; Neuropathy; Quadriplegia; Paraplegia; Seizure Disorder Past Medical History Notes: ct scan showed swollen lymph nodes Oncologic Medical History: Positive for: Received Chemotherapy - interfeon immunotherapy; Received Radiation Past Medical History Notes: Melanoma (R) shoulder 07/2010; (R) axillary lymphnode removal Psychiatric Medical History: Negative for: Anorexia/bulimia; Confinement Anxiety HBO Extended History Items Eyes: Cataracts Immunizations Pneumococcal  Vaccine: Received Pneumococcal Vaccination: Yes Received Pneumococcal Vaccination On or After 60th Birthday: No Immunization Notes: tetanus shot w/in the last 5 years per pt Implantable Devices None Hospitalization / Surgery History Type of Hospitalization/Surgery Melanoma left shoulder Family and Social History Cancer: Yes - Mother,Paternal Grandparents; Diabetes: No; Heart Disease: No; Hereditary Spherocytosis: No; Hypertension: Yes - Mother,Father; Kidney Disease: Yes - Paternal Grandparents; Lung Disease: Yes - Paternal Grandparents; Seizures: No; Stroke: No; Thyroid Problems: No; Tuberculosis: No; Former smoker - quit at age 4 - ended on 04/19/1966; Marital Status - Divorced; Alcohol Use: Never; Drug Use: No History; Caffeine Use: Daily; Financial Concerns: No; Food, Clothing or Shelter Needs: No; Support System Lacking: No; Transportation Concerns: No Electronic Signature(s) Signed: 06/28/2021 2:12:34 PM By: Kalman Shan DO Signed: 07/03/2021 1:47:40 PM By: Carlene Coria RN Entered By: Kalman Shan on 06/28/2021 14:08:52 Faries, [name] -------------------------------------------------------------------------------- SuperBill Details Patient Name: LEONELA, NAKAYAMA. Date of Service: 06/28/2021 Medical Record Number: [id number] Patient Account Number: [id number] Date of Birth/Sex: 01-May-1946 (75 y.o. F) Treating RN: Carlene Coria Primary Care Provider: [name] Other Clinician: Referring Provider: [name] Treating Provider/Extender: Yaakov Guthrie in Treatment: 11 Diagnosis Coding ICD-10 Codes Code Description  [id number] Chronic venous hypertension (idiopathic) with ulcer and inflammation of left lower extremity I89.0 Lymphedema, not elsewhere classified L97.221 Non-pressure chronic ulcer of left calf limited to breakdown of skin Facility Procedures CPT4 Code: [id number] Description: (Facility Use Only) [id number] - [facility]  LT LEG Modifier: Quantity: 1 Physician Procedures CPT4 Code Description: [id number] - WC PHYS LEVEL 3 - EST PT Modifier: Quantity: 1 CPT4 Code Description: ICD-10 Diagnosis Description I87.332 Chronic venous hypertension (idiopathic) with ulcer and inflammation of I89.0 Lymphedema, not elsewhere classified L97.221 Non-pressure chronic ulcer of left calf limited to breakdown of skin Modifier: left lower extremity Quantity: Electronic Signature(s) Signed: 06/28/2021 2:49:02 PM By: Kalman Shan DO Signed: 07/03/2021 1:47:40 PM By: Carlene Coria RN Previous Signature: 06/28/2021 2:12:34 PM Version By: Kalman Shan DO Entered By: Carlene Coria on 06/28/2021 14:16:36

## 2021-07-03 NOTE — Progress Notes (Signed)
UNKOWN, TEDFORD ([id number]) Visit Report for 06/28/2021 Arrival Information Details Patient Name: [name], [name]. Date of Service: 06/28/2021 1:15 PM Medical Record Number: [id number] Patient Account Number: [id number] Date of Birth/Sex: 1946-03-05 (75 y.o. F) Treating RN: Carlene Coria Primary Care Milad Bublitz: [name] Other Clinician: Referring Jazzalyn Loewenstein: [name] Treating Amran Malter/Extender: Yaakov Guthrie in Treatment: 11 Visit Information History Since Last Visit All ordered tests and consults were completed: No Patient Arrived: Ambulatory Added or deleted any medications: No Arrival Time: 13:22 Any new allergies or adverse reactions: No Accompanied By: self Had a fall or experienced change in No Transfer Assistance: None activities of daily living that may affect Patient Identification Verified: Yes risk of falls: Secondary Verification Process Completed: Yes Signs or symptoms of abuse/neglect since last visito No Patient Requires Transmission-Based No Hospitalized since last visit: No Precautions: Implantable device outside of the clinic excluding No Patient Has Alerts: Yes cellular tissue based products placed in the center Patient Alerts: Patient on Blood since last visit: Thinner Has Dressing in Place as Prescribed: Yes ASPIRIN Has Compression in Place as Prescribed: Yes NOT diabetic Pain Present Now: Yes Electronic Signature(s) Signed: 07/03/2021 1:47:40 PM By: Carlene Coria RN Entered By: Carlene Coria on 06/28/2021 13:22:28 [name], [name] -------------------------------------------------------------------------------- Clinic Level of Care Assessment Details Patient Name: [name], [name]. Date of Service: 06/28/2021 1:15 PM Medical Record Number: [id number] Patient Account Number: [id number] Date of Birth/Sex: 02-Jun-1946 (75 y.o. F) Treating RN: Carlene Coria Primary Care [name] Resnick: [name] Other Clinician: Referring  Katreena Schupp: [name] Treating Soumya Colson/Extender: Yaakov Guthrie in Treatment: 11 Clinic Level of Care Assessment Items TOOL 1 Quantity Score '[]'$  - Use when EandM and Procedure is performed on INITIAL visit 0 ASSESSMENTS - Nursing Assessment / Reassessment '[]'$  - General Physical Exam (combine w/ comprehensive assessment (listed just below) when performed on new 0 pt. evals) '[]'$  - 0 Comprehensive Assessment (HX, ROS, Risk Assessments, Wounds Hx, etc.) ASSESSMENTS - Wound and Skin Assessment / Reassessment '[]'$  - Dermatologic / Skin Assessment (not related to wound area) 0 ASSESSMENTS - Ostomy and/or Continence Assessment and Care '[]'$  - Incontinence Assessment and Management 0 '[]'$  - 0 Ostomy Care Assessment and Management (repouching, etc.) PROCESS - Coordination of Care '[]'$  - Simple Patient / Family Education for ongoing care 0 '[]'$  - 0 Complex (extensive) Patient / Family Education for ongoing care '[]'$  - 0 Staff obtains Programmer, systems, Records, Test Results / Process Orders '[]'$  - 0 Staff telephones HHA, Nursing Homes / Clarify orders / etc '[]'$  - 0 Routine Transfer to another Facility (non-emergent condition) '[]'$  - 0 Routine Hospital Admission (non-emergent condition) '[]'$  - 0 New Admissions / Biomedical engineer / Ordering NPWT, Apligraf, etc. '[]'$  - 0 Emergency Hospital Admission (emergent condition) PROCESS - Special Needs '[]'$  - Pediatric / Minor Patient Management 0 '[]'$  - 0 Isolation Patient Management '[]'$  - 0 Hearing / Language / Visual special needs '[]'$  - 0 Assessment of Community assistance (transportation, D/C planning, etc.) '[]'$  - 0 Additional assistance / Altered mentation '[]'$  - 0 Support Surface(s) Assessment (bed, cushion, seat, etc.) INTERVENTIONS - Miscellaneous '[]'$  - External ear exam 0 '[]'$  - 0 Patient Transfer (multiple staff / Civil Service fast streamer / Similar devices) '[]'$  - 0 Simple Staple / Suture removal (25 or less) '[]'$  - 0 Complex Staple / Suture removal (26 or more) '[]'$   - 0 Hypo/Hyperglycemic Management (do not check if billed separately) '[]'$  - 0 Ankle / Brachial Index (ABI) - do not check if billed separately Has the  patient been seen at the hospital within the last three years: Yes Total Score: 0 Level Of Care: ____ [name] Electronic Signature(s) Signed: 07/03/2021 1:47:40 PM By: Carlene Coria RN Entered By: Carlene Coria on 06/28/2021 14:16:21 [name], [name] -------------------------------------------------------------------------------- Compression Therapy Details Patient Name: [name], [name]. Date of Service: 06/28/2021 1:15 PM Medical Record Number: [id number] Patient Account Number: [id number] Date of Birth/Sex: 07-07-1946 (75 y.o. F) Treating RN: Carlene Coria Primary Care Herschel Fleagle: [name] Other Clinician: Referring Vernee Baines: [name] Treating Leander Tout/Extender: Yaakov Guthrie in Treatment: 11 Compression Therapy Performed for Wound Assessment: Wound #13 Left,Distal,Posterior Lower Leg Performed By: Clinician Carlene Coria, RN Compression Type: Four Layer Post Procedure Diagnosis Same as Pre-procedure Electronic Signature(s) Signed: 07/03/2021 1:47:40 PM By: Carlene Coria RN Entered By: Carlene Coria on 06/28/2021 14:00:57 [name] -------------------------------------------------------------------------------- Encounter Discharge Information Details Patient Name: [name], [name]. Date of Service: 06/28/2021 1:15 PM Medical Record Number: [id number] Patient Account Number: [id number] Date of Birth/Sex: 1945-12-12 (75 y.o. F) Treating RN: Carlene Coria Primary Care Telisha Zawadzki: [name] Other Clinician: Referring Tilly Pernice: [name] Treating Czarina Gingras/Extender: Yaakov Guthrie in Treatment: 11 Encounter Discharge Information Items Discharge Condition: Stable Ambulatory Status: Ambulatory Discharge Destination: Home Transportation: Private  Auto Accompanied By: self Schedule Follow-up Appointment: Yes Clinical Summary of Care: Patient Declined Electronic Signature(s) Signed: 07/03/2021 1:47:40 PM By: Carlene Coria RN Entered By: Carlene Coria on 06/28/2021 14:17:32 [place], [name] -------------------------------------------------------------------------------- Lower Extremity Assessment Details Patient Name: [name], [name]. Date of Service: 06/28/2021 1:15 PM Medical Record Number: [id number] Patient Account Number: [id number] Date of Birth/Sex: August 15, 1946 (75 y.o. F) Treating RN: Carlene Coria Primary Care Taheerah Guldin: [name] Other Clinician: Referring Melisa Donofrio: [name] Treating Traevon Meiring/Extender: Yaakov Guthrie in Treatment: 11 Edema Assessment Assessed: [Left: No] Patrice Paradise: No] [Left: Edema] [Right: :] Calf Left: Right: Point of Measurement: 32 cm From Medial Instep 38 cm 42 cm Ankle Left: Right: Point of Measurement: 10 cm From Medial Instep 22 cm 23 cm Vascular Assessment Pulses: Dorsalis Pedis Palpable: [Left:Yes] [Right:Yes] Electronic Signature(s) Signed: 07/03/2021 1:47:40 PM By: Carlene Coria RN Entered By: Carlene Coria on 06/28/2021 13:35:35 Stehlik, [name] -------------------------------------------------------------------------------- Multi Wound Chart Details Patient Name: [name], [name]. Date of Service: 06/28/2021 1:15 PM Medical Record Number: [id number] Patient Account Number: [id number] Date of Birth/Sex: 03/02/1946 (75 y.o. F) Treating RN: Carlene Coria Primary Care Selah Zelman: [name] Other Clinician: Referring Xion [name]: [name] Treating Hawkins Seaman/Extender: Yaakov Guthrie in Treatment: 11 Vital Signs Height(in): 58 Pulse(bpm): 70 Weight(lbs): 212 Blood Pressure(mmHg): 181/83 Body Mass Index(BMI): 38 Temperature(F): 98.4 Respiratory Rate(breaths/min): 18 Photos: [N/A:N/A] Wound Location: Left, Distal, Posterior  Lower Leg Right Lower Leg N/A Wounding Event: Gradually Appeared Gradually Appeared N/A Primary Etiology: Venous Leg Ulcer Venous Leg Ulcer N/A Comorbid History: Cataracts, Asthma, Sleep Apnea, Cataracts, Asthma, Sleep Apnea, N/A Deep Vein Thrombosis, Deep Vein Thrombosis, Hypertension, Peripheral Venous Hypertension, Peripheral Venous Disease, Osteoarthritis, Received Disease, Osteoarthritis, Received Chemotherapy, Received Radiation Chemotherapy, Received Radiation Date Acquired: 02/24/2021 05/05/2021 N/A Weeks of Treatment: 11 4 N/A Wound Status: Open Open N/A Measurements L x W x D (cm) 1.7x1.5x0.2 0.1x0.1x0.1 N/A Area (cm) : 2.003 0.008 N/A Volume (cm) : 0.401 0.001 N/A % Reduction in Area: 66.00% 100.00% N/A % Reduction in Volume: 31.90% 99.90% N/A Classification: Full Thickness Without Exposed Partial Thickness N/A Support Structures Exudate Amount: Medium None Present N/A Exudate Type: Serosanguineous N/A N/A Exudate Color: red, brown N/A N/A Granulation Amount: Medium (34-66%) None Present (0%) N/A  Granulation Quality: Red, Pink N/A N/A Necrotic Amount: Medium (34-66%) None Present (0%) N/A Exposed Structures: Fat Layer (Subcutaneous Tissue): Fascia: No N/A Yes Fat Layer (Subcutaneous Tissue): Fascia: No No Tendon: No Tendon: No Muscle: No Muscle: No Joint: No Joint: No Bone: No Bone: No Epithelialization: Medium (34-66%) Large (67-100%) N/A Procedures Performed: Compression Therapy N/A N/A Treatment Notes Electronic Signature(s) Signed: 06/28/2021 2:12:34 PM By: Kalman Shan DO Entered By: Kalman Shan on 06/28/2021 14:08:01 [name], [name] [name], [name] -------------------------------------------------------------------------------- Multi-Disciplinary Care Plan Details Patient Name: [name], [name]. Date of Service: 06/28/2021 1:15 PM Medical Record Number: [id number] Patient Account Number: [id number] Date of Birth/Sex:  [birth year] (75 y.o. F) Treating RN: Carlene Coria Primary Care Averey Trompeter: [name] Other Clinician: Referring Clint Biello: [name] Treating Eleshia Wooley/Extender: Yaakov Guthrie in Treatment: 11 Active Inactive Venous Leg Ulcer Nursing Diagnoses: Actual venous Insuffiency (use after diagnosis is confirmed) Knowledge deficit related to disease process and management Potential for venous Insuffiency (use before diagnosis confirmed) Goals: Patient will maintain optimal edema control Date Initiated: 04/12/2021 Target Resolution Date: 08/26/2021 Goal Status: Active Interventions: Assess peripheral edema status every visit. Compression as ordered Provide education on venous insufficiency Treatment Activities: Therapeutic compression applied : 04/12/2021 Notes: Electronic Signature(s) Signed: 07/03/2021 1:47:40 PM By: Carlene Coria RN Entered By: Carlene Coria on 06/28/2021 13:56:20 Fitzgibbons, [name] -------------------------------------------------------------------------------- Pain Assessment Details Patient Name: [name], [name]. Date of Service: 06/28/2021 1:15 PM Medical Record Number: [id number] Patient Account Number: [id number] Date of Birth/Sex: 1946/02/21 (75 y.o. F) Treating RN: Carlene Coria Primary Care Renad Jenniges: [name] Other Clinician: Referring Joseph Johns: [name] Treating Ettie Krontz/Extender: Yaakov Guthrie in Treatment: 11 Active Problems Location of Pain Severity and Description of Pain Patient Has Paino Yes Site Locations With Dressing Change: Yes Duration of the Pain. Constant / Intermittento Intermittent How Long Does it Lasto Hours: Minutes: 15 Rate the pain. Current Pain Level: 4 Worst Pain Level: 4 Least Pain Level: 0 Tolerable Pain Level: 5 Character of Pain Describe the Pain: Burning Pain Management and Medication Current Pain Management: Medication: No Cold Application: No Rest: Yes Massage:  No Activity: No T.E.N.S.: No Heat Application: No Leg drop or elevation: No Is the Current Pain Management Adequate: Inadequate How does your wound impact your activities of daily livingo Sleep: No Bathing: No Appetite: No Relationship With Others: No Bladder Continence: No Emotions: No Bowel Continence: No Work: No Toileting: No Drive: No Dressing: No Hobbies: No Electronic Signature(s) Signed: 07/03/2021 1:47:40 PM By: Carlene Coria RN Entered By: Carlene Coria on 06/28/2021 13:24:14 Lepore, [name] -------------------------------------------------------------------------------- Patient/Caregiver Education Details Patient Name: [name], [name]. Date of Service: 06/28/2021 1:15 PM Medical Record Number: [id number] Patient Account Number: [id number] Date of Birth/Gender: [birth year] (75 y.o. F) Treating RN: Carlene Coria Primary Care Physician: [name] Other Clinician: Referring Physician: [name] Treating Physician/Extender: Yaakov Guthrie in Treatment: 11 Education Assessment Education Provided To: Patient Education Topics Provided Venous: Methods: Explain/Verbal Responses: State content correctly Electronic Signature(s) Signed: 07/03/2021 1:47:40 PM By: Carlene Coria RN Entered By: Carlene Coria on 06/28/2021 14:16:47 [place], [name] -------------------------------------------------------------------------------- Wound Assessment Details Patient Name: [name], [name]. Date of Service: 06/28/2021 1:15 PM Medical Record Number: [id number] Patient Account Number: [id number] Date of Birth/Sex: [birth year] (75 y.o. F) Treating RN: Carlene Coria Primary Care Suleyma Wafer: [name] Other Clinician: Referring Aminta Sakurai: [name] Treating Abimbola Aki/Extender: Yaakov Guthrie in Treatment: 11 Wound Status Wound Number: 13 Primary Venous Leg Ulcer Etiology: Wound Location: Left, Distal,  Posterior Lower Leg Wound  Open Wounding Event: Gradually Appeared Status: Date Acquired: 02/24/2021 Comorbid Cataracts, Asthma, Sleep Apnea, Deep Vein Thrombosis, Weeks Of Treatment: 11 History: Hypertension, Peripheral Venous Disease, Osteoarthritis, Clustered Wound: No Received Chemotherapy, Received Radiation Photos Wound Measurements Length: (cm) 1.7 Width: (cm) 1.5 Depth: (cm) 0.2 Area: (cm) 2.003 Volume: (cm) 0.401 % Reduction in Area: 66% % Reduction in Volume: 31.9% Epithelialization: Medium (34-66%) Tunneling: No Undermining: No Wound Description Classification: Full Thickness Without Exposed Support Structures Exudate Amount: Medium Exudate Type: Serosanguineous Exudate Color: red, brown Foul Odor After Cleansing: No Slough/Fibrino Yes Wound Bed Granulation Amount: Medium (34-66%) Exposed Structure Granulation Quality: Red, Pink Fascia Exposed: No Necrotic Amount: Medium (34-66%) Fat Layer (Subcutaneous Tissue) Exposed: Yes Necrotic Quality: Adherent Slough Tendon Exposed: No Muscle Exposed: No Joint Exposed: No Bone Exposed: No Treatment Notes Wound #13 (Lower Leg) Wound Laterality: Left, Posterior, Distal Cleanser Soap and Water Discharge Instruction: Gently cleanse wound with antibacterial soap, rinse and pat dry prior to dressing wounds Peri-Wound Care SKARLET, TROCHEZ ([id number]) Topical Triamcinolone Acetonide Cream, 0.1%, 15 (g) tube Discharge Instruction: Peri wound Apply as directed by Rannie Craney. Primary Dressing Prisma 4.34 (in) Discharge Instruction: Moisten w/normal saline or sterile water; Cover wound as directed. Do not remove from wound bed. Secondary Dressing ABD Pad 5x9 (in/in) Discharge Instruction: Cover with ABD pad Secured With Compression Wrap Medichoice 4 layer Compression System, 35-40 mmHG Discharge Instruction: Apply multi-layer wrap as directed. Compression Stockings Add-Ons Electronic Signature(s) Signed: 07/03/2021 1:47:40 PM By: Carlene Coria  RN Entered By: Carlene Coria on 06/28/2021 13:33:36 Elamin, [name] -------------------------------------------------------------------------------- Wound Assessment Details Patient Name: MIRYAH, DELGATTO. Date of Service: 06/28/2021 1:15 PM Medical Record Number: [id number] Patient Account Number: [id number] Date of Birth/Sex: [birth year] (75 y.o. F) Treating RN: Carlene Coria Primary Care Neville Walston: [name] Other Clinician: Referring Kismet Facemire: [name] Treating Bradon Fester/Extender: Yaakov Guthrie in Treatment: 11 Wound Status Wound Number: 14 Primary Venous Leg Ulcer Etiology: Wound Location: Right Lower Leg Wound Open Wounding Event: Gradually Appeared Status: Date Acquired: 05/05/2021 Comorbid Cataracts, Asthma, Sleep Apnea, Deep Vein Thrombosis, Weeks Of Treatment: 4 History: Hypertension, Peripheral Venous Disease, Osteoarthritis, Clustered Wound: No Received Chemotherapy, Received Radiation Photos Wound Measurements Length: (cm) 0.1 Width: (cm) 0.1 Depth: (cm) 0.1 Area: (cm) 0.008 Volume: (cm) 0.001 % Reduction in Area: 100% % Reduction in Volume: 99.9% Epithelialization: Large (67-100%) Tunneling: No Undermining: No Wound Description Classification: Partial Thickness Exudate Amount: None Present Foul Odor After Cleansing: No Slough/Fibrino No Wound Bed Granulation Amount: None Present (0%) Exposed Structure Necrotic Amount: None Present (0%) Fascia Exposed: No Fat Layer (Subcutaneous Tissue) Exposed: No Tendon Exposed: No Muscle Exposed: No Joint Exposed: No Bone Exposed: No Treatment Notes Wound #14 (Lower Leg) Wound Laterality: Right Cleanser Soap and Water Discharge Instruction: Gently cleanse wound with antibacterial soap, rinse and pat dry prior to dressing wounds Peri-Wound Care Topical JAKIYA, SWOVELAND. ([id number]) Triamcinolone Acetonide Cream, 0.1%, 15 (g) tube Discharge Instruction: Peri wound Apply as  directed by Llewellyn Schoenberger. Primary Dressing Secondary Dressing Non-Adherent Pad 3x8 (in/in) Secured With Compression Wrap coban Compression Stockings Add-Ons Electronic Signature(s) Signed: 07/03/2021 1:47:40 PM By: Carlene Coria RN Entered By: Carlene Coria on 06/28/2021 13:34:08 Smedley, [name] -------------------------------------------------------------------------------- Vitals Details Patient Name: ZI, PLETT. Date of Service: 06/28/2021 1:15 PM Medical Record Number: [id number] Patient Account Number: [id number] Date of Birth/Sex: August 13, 1946 (75 y.o. F) Treating RN: Carlene Coria Primary Care Aundrea Horace: [name] Other Clinician: Referring Katie Faraone: [name] Treating Jamille Yoshino/Extender: Kalman Shan  Weeks in Treatment: 11 Vital Signs Time Taken: 13:22 Temperature (F): 98.4 Height (in): 63 Pulse (bpm): 89 Weight (lbs): 212 Respiratory Rate (breaths/min): 18 Body Mass Index (BMI): 37.6 Blood Pressure (mmHg): 181/83 Reference Range: 80 - 120 mg / dl Electronic Signature(s) Signed: 07/03/2021 1:47:40 PM By: Carlene Coria RN Entered By: Carlene Coria on 06/28/2021 13:22:43

## 2021-07-03 NOTE — Telephone Encounter (Signed)
Pt called wanting to discuss the Oxycodone

## 2021-07-03 NOTE — Telephone Encounter (Signed)
She was initially on tramadol but I sent in oxycodone over weekend for breakthrough pain.  Please call for an update, see how oxycodone helped.

## 2021-07-04 ENCOUNTER — Telehealth: Payer: Self-pay | Admitting: Family Medicine

## 2021-07-04 NOTE — Telephone Encounter (Signed)
Spoke with pt asking about oxycodone.  States no one called to let her know about the rx so she has still been taking tramadol.  Says pain is now going from left lower buttock up to left low back.  Per Dr. Darnell Level, rec stopping tramadol and start oxycodone.  Pt verbalizes understanding and will pick up rx today.   Pt has OV tomorrow at 11:00.

## 2021-07-04 NOTE — Telephone Encounter (Signed)
See Phn note, 07/03/21.

## 2021-07-04 NOTE — Telephone Encounter (Signed)
Lvm asking pt to call back. Need update to see if oxycodone helps back pain.

## 2021-07-04 NOTE — Telephone Encounter (Signed)
Pt. [name] would like for [name] to call her (tbuck)

## 2021-07-04 NOTE — Telephone Encounter (Signed)
Please give pt a call. (Tbuck)

## 2021-07-04 NOTE — Telephone Encounter (Signed)
Pt called back returning your call °

## 2021-07-04 NOTE — Telephone Encounter (Signed)
See phn note, 07/03/21.

## 2021-07-05 ENCOUNTER — Encounter (HOSPITAL_BASED_OUTPATIENT_CLINIC_OR_DEPARTMENT_OTHER): Payer: Medicare Other | Admitting: Internal Medicine

## 2021-07-05 ENCOUNTER — Other Ambulatory Visit: Payer: Self-pay

## 2021-07-05 ENCOUNTER — Ambulatory Visit (INDEPENDENT_AMBULATORY_CARE_PROVIDER_SITE_OTHER): Payer: Medicare Other | Admitting: Family Medicine

## 2021-07-05 ENCOUNTER — Encounter: Payer: Self-pay | Admitting: Family Medicine

## 2021-07-05 VITALS — BP 120/70 | HR 90 | Temp 97.4°F | Ht 63.0 in | Wt 202.0 lb

## 2021-07-05 DIAGNOSIS — R634 Abnormal weight loss: Secondary | ICD-10-CM | POA: Insufficient documentation

## 2021-07-05 DIAGNOSIS — L97221 Non-pressure chronic ulcer of left calf limited to breakdown of skin: Secondary | ICD-10-CM

## 2021-07-05 DIAGNOSIS — M5431 Sciatica, right side: Secondary | ICD-10-CM | POA: Diagnosis not present

## 2021-07-05 DIAGNOSIS — L97821 Non-pressure chronic ulcer of other part of left lower leg limited to breakdown of skin: Secondary | ICD-10-CM | POA: Diagnosis not present

## 2021-07-05 MED ORDER — METHOCARBAMOL 500 MG PO TABS: 500.0000 mg | ORAL_TABLET | Freq: Three times a day (TID) | ORAL | 0 refills | Status: DC | PRN

## 2021-07-05 MED ORDER — ACETAMINOPHEN ER 650 MG PO TBCR: 1300.0000 mg | EXTENDED_RELEASE_TABLET | Freq: Two times a day (BID) | ORAL | Status: AC | PRN

## 2021-07-05 NOTE — Assessment & Plan Note (Signed)
Will monitor (~15lb weight loss).  She is also planning to schedule f/u with onc as due.

## 2021-07-05 NOTE — Progress Notes (Signed)
MAKINLEIGH, TILDEN ([id number]) Visit Report for 07/05/2021 Chief Complaint Document Details Patient Name: ALIONA, SLUITER. Date of Service: 07/05/2021 1:15 PM Medical Record Number: [id number] Patient Account Number: [id number] Date of Birth/Sex: 1946-01-29 (75 y.o. F) Treating RN: Donnamarie Poag Primary Care Provider: [name] Other Clinician: Referring Provider: [name] Treating Provider/Extender: Yaakov Guthrie in Treatment: 12 Information Obtained from: Patient Chief Complaint Left calf venous stasis ulceration. 11/13/16; the patient is here for a left calf recurrent ulceration which is painful and has been present for the last month 01/22/17; the patient re-presents here for recurrent difficulties with blistering and leaking fluid on her left posterior calf 12/10/18; the patient returns to clinic for a wound on the left medial calf 04/12/2021; left lower extremity wound, 05/31/21; right leg wound Electronic Signature(s) Signed: 07/05/2021 2:30:18 PM By: Kalman Shan DO Entered By: Kalman Shan on 07/05/2021 14:25:23 [place], [name] -------------------------------------------------------------------------------- Debridement Details Patient Name: SADIYYAH, TOMEK. Date of Service: 07/05/2021 1:15 PM Medical Record Number: [id number] Patient Account Number: [id number] Date of Birth/Sex: Jul 31, 1946 (75 y.o. F) Treating RN: Donnamarie Poag Primary Care Provider: [name] Other Clinician: Referring Provider: [name] Treating Provider/Extender: Yaakov Guthrie in Treatment: 12 Debridement Performed for Wound #13 Left,Distal,Posterior Lower Leg Assessment: Performed By: Physician Kalman Shan, MD Debridement Type: Debridement Severity of Tissue Pre Debridement: Fat layer exposed Level of Consciousness (Pre- Awake and Alert procedure): Pre-procedure Verification/Time Out Yes - 14:03 Taken: Start Time: 14:03 Pain Control:  Lidocaine Total Area Debrided (L x W): 1.6 (cm) x 1.4 (cm) = 2.24 (cm) Tissue and other material Viable, Non-Viable, Slough, Subcutaneous, Biofilm, Slough debrided: Level: Skin/Subcutaneous Tissue Debridement Description: Excisional Instrument: Curette Bleeding: Minimum Hemostasis Achieved: Pressure End Time: 14:04 Response to Treatment: Procedure was tolerated well Level of Consciousness (Post- Awake and Alert procedure): Post Debridement Measurements of Total Wound Length: (cm) 1.6 Width: (cm) 1.4 Depth: (cm) 0.2 Volume: (cm) 0.352 Character of Wound/Ulcer Post Debridement: Improved Severity of Tissue Post Debridement: Fat layer exposed Post Procedure Diagnosis Same as Pre-procedure Electronic Signature(s) Signed: 07/05/2021 2:30:18 PM By: Kalman Shan DO Signed: 07/05/2021 3:14:25 PM By: Donnamarie Poag Entered By: Donnamarie Poag on 07/05/2021 [place], [name] -------------------------------------------------------------------------------- HPI Details Patient Name: KENSI, TULLOS. Date of Service: 07/05/2021 1:15 PM Medical Record Number: [id number] Patient Account Number: [id number] Date of Birth/Sex: 1946-06-26 (75 y.o. F) Treating RN: Donnamarie Poag Primary Care Provider: [name] Other Clinician: Referring Provider: [name] Treating Provider/Extender: Yaakov Guthrie in Treatment: 12 History of Present Illness HPI Description: Pleasant 75 year old with history of chronic venous insufficiency. No diabetes or peripheral vascular disease. Left ABI 1.29. Questionable history of left lower extremity DVT. She developed a recurrent ulceration on her left lateral calf in December 2015, which she attributes to poor diet and subsequent lower extremity edema. She underwent endovenous laser ablation of her left greater saphenous vein in 2010. She underwent laser ablation of accessory branch of left GSV in April 2016 by Dr. Kellie Simmering at [facility]. She was previously wearing Unna boots, which she tolerated well. Tolerating 2 layer compression and cadexomer iodine. She returns to clinic for follow-up and is without new complaints. She denies any significant pain at this time. She reports persistent pain with pressure. No claudication or ischemic rest pain. No fever or chills. No drainage. READMISSION 11/13/16; this is a 75 year old woman who is not a diabetic. She is here for a review of a painful area on her left medial lower extremity. I  note that she was seen here previously last year for wound I believe to be in the same area. At that time she had undergone previously a left greater saphenous vein ablation by Dr. Kellie Simmering and she had a ablation of the anterior accessory branch of the left greater saphenous vein in March 2016. Seeing that the wound actually closed over. In reviewing the history with her today the ulcer in this area has been recurrent. She describes a biopsy of this area in 2009 that only showed stasis physiology. She also has a history of today malignant melanoma in the right shoulder for which she follows with Dr. 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 Do not see that she has ever had arterial studies in the left leg. She tells me she has a follow-up with Dr. Kellie Simmering in roughly 10 days In any case she developed the reopening of this area roughly a month ago. On the background of this she describes rapidly increasing edema which has responded to Lasix 40 mg and metolazone 2.5 mg as well as the patient's lymph massage. She has been told she has both venous insufficiency and lymphedema but she cannot tolerate compression stockings 11/28/16; the patient saw Dr. Kellie Simmering recently. Per the patient he did arterial Dopplers in the office that did not show evidence of arterial insufficiency, per the patient he  stated "treat this like an ordinary venous ulcer". She also saw her dermatologist Dr. Ronnald Ramp who felt that this was more of a vascular ulcer. In general things are improving although she arrives today with increasing bilateral lower extremity edema with weeping a deeper fluid through the wound on the left medial leg compatible with some degree of lymphedema 12/04/16; the patient's wound is fully epithelialized but I don't think fully healed. We will do another week of depression with Promogran and TCA however I suspect we'll be able to discharge her next week. This is a very unusual-looking wound which was initially a figure-of-eight type wound lying on its side surrounded by petechial like hemorrhage. She has had venous ablation on this side. She apparently does not have an arterial issue per Dr. Kellie Simmering. She saw her dermatologist thought it was "vascular". Patient is definitely going to need ongoing compression and I talked about this with her today she will go to elastic therapy after she leaves here next week 12/11/16; the patient's wound is not completely closed today. She has surrounding scar tissue and in further discussion with the patient it would appear that she had ulcers in this area in 2009 for a prolonged period of time ultimately requiring a punch biopsy of this area that only showed venous insufficiency. I did not previously pickup on this part of the history from the patient. 12/18/16; the patient's wound is completely epithelialized. There is no open area here. She has significant bilateral venous insufficiency with secondary lymphedema to a mild-to-moderate degree she does not have compression stockings.. She did not say anything to me when I was in the room, she told our intake nurse that she was still having pain in this area. This isn't unusual recurrent small open area. She is going to go to elastic therapy to obtain compression stockings. 12/25/16; the patient's wound is fully  epithelialized. There is no open area here. The patient describes some continued episodic discomfort in this area medial left calf. However everything looks fine  and healed here. She is been to elastic therapy and caught herself 15-20 mmHg stockings, they apparently were having trouble getting 20-30 mm stockings in her size 01/22/17; this is a patient we discharged from the clinic a month ago. She has a recurrent open wound on her medial left calf. She had 15 mm support stockings. I told her I thought she needed 20-30 mm compression stockings. She tells me that she has been ill with hospitalization secondary to asthma and is been found to have severe hypokalemia likely secondary to a combination of Lasix and metolazone. This morning she noted blistering and leaking fluid on the posterior part of her left leg. She called our intake nurse urgently and we was saw her this afternoon. She has not had any real discomfort here. I don't know that she's been wearing any stockings on this leg for at least 2-3 days. ABIs in this clinic were 1.21 on the right and 1.3 on the left. She is previously seen vascular surgery who does not think that there is a peripheral arterial issue. 01/30/17; Patient arrives with no open wound on the left leg. She has been to elastic therapy and obtained 20-23mhg below knee stockings and she has one on the right leg today. READMISSION 02/19/18; this PBarrickis a now 75year old patient we've had in this clinic perhaps 3 times before. I had last looked at her from January 07 December 2016 with an area on the medial left leg. We discharged her on 12/25/16 however she had to be readmitted on 01/22/17 with a recurrence. I have in my notes that we discharged her on 20-30 mm stockings although she tells me she was only wearing support hose because she cannot get stockings on predominantly related to her cervical spine surgery/issues. She [place] on the left with an anterior accessory branch ablation I think both of these were in 2016. On one of the previous visit she had a biopsy noted 2009 that was negative. She is not felt to have an arterial issue. She is not a diabetic. She does have a history of obstructive sleep apnea hypertension asthma as well as chronic venous insufficiency and lymphedema. On this occasion she noted 2 dry scaly patch on her left leg. She tried to put lotion on this it didn't really help. There were 2 open areas.the patient has been seeing her primary physician from 02/05/18 through 02/14/18. She had Unna boots applied. The superior wound now on the lateral left leg has closed but she's had one wound that remains open on the lateral left leg. This is not the same spot as we dealt with in 2018. ABIs in this clinic were 1.3 bilaterally PBEETA, DINIS([id number] 02/26/18; patient has a small wound on the left lateral calf. Dimensions are down. She has chronic venous insufficiency and lymphedema. 03/05/18; small open area on the left lateral calf. Dimensions are down. Tightly adherent necrotic debris over the surface of the wound which was difficult to remove. Also the dressing [over collagen] stuck to the wound surface. This was removed with some difficulty as well. Change the primary dressing to Hydrofera Blue ready 03/12/18; small open area on the left lateral calf. Comes in with tightly adherent surface eschar as well as some adherent Hydrofera Blue. 03/19/18; open area on the left lateral calf. Again adherent surface eschar as well as some adherent Hydrofera Blue nonviable subcutaneous tissue. She  complained of pain all week even with the reduction from 4-3 layer compression I put on last week. Also she had an increase in her ankle and calf measurements probably related to the same thing. 03/26/18; open area on the left lateral calf. A very small open area remains here.  We used silver alginate starting last week as the Hydrofera Blue seem to stick to the wound bed. In using 4-layer compression 04/02/18; the open area in the left lateral calf at some adherent slough which I removed there is no open area here. We are able to transition her into her own compression stocking. Truthfully I think this is probably his support hose. However this does not maintain skin integrity will be limited. She cannot put over the toe compression stockings on because of neck problems hand problems etc. She is allergic to the lining layer of juxta lites. We might be forced to use extremitease stocking should this fail READMIT 11/24/2018 Patient is now a 75 year old woman who is not a diabetic. She has been in this clinic on at least 3 previous occasions largely with recurrent wounds on her left leg secondary to chronic venous insufficiency with secondary lymphedema. Her situation is complicated by inability to get stockings on and an allergy to neoprene which is apparently a component and at least juxta lites and other stockings. As a result she really has not been wearing any stockings on her legs. She tells [place] that roughly 2 or 3 weeks ago she started noticing a stinging sensation just above her ankle on the left medial aspect. She has been diagnosed with pseudogout and she wondered whether this was what she was experiencing. She tried to dress this with something she bought at the store however subsequently it pulled skin off and now she has an open wound that is not improving. She has been using Vaseline gauze with a cover bandage. She saw her primary doctor last week who put an Haematologist on her. ABIs in this clinic was 1.03 on the left 2/12; the area is on the left medial ankle. Odd-looking wound with what looks to be surface epithelialization but a multitude of small petechial openings. This clearly not closed yet. We have been using silver alginate under 3 layer compression with  TCA 2/19; the wound area did not look quite as good this week. Necrotic debris over the majority of the wound surface which required debridement. She continues to have a multitude of what looked to be small petechial openings. She reminds [place] that she had a biopsy on this initially during her first outbreak in 2015 in [place] [facility]. She expresses concern about this being a possible melanoma. 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 I assured her that this does not look anything like melanoma. Except for the petechial reaction it does look like a venous insufficiency area and she certainly has evidence of this on both sides 2/26; a difficult area on the left medial ankle. The patient clearly has chronic venous hypertension with some degree of lymphedema. The odd thing about the area is the small petechial hemorrhages. I am not really sure how to explain this. This was present last time and this is not a compression injury. We have been using Hydrofera Blue which I changed to last week 3/4; still using Hydrofera Blue. Aggressive debridement today. She does not have known arterial issues. She has seen Dr. Kellie Simmering at [facility]  vein and vascular and and has an ablation on the left. [Anterior accessory branch of the greater saphenous]. From what I remember they did not feel she had an arterial issue. The patient has had this area biopsied in 2009 at [facility] dermatology and by her recollection they said this was "stasis". She is also follow-up with dermatology locally who thought that this was more of a vascular issue 3/11; using Hydrofera Blue. Aggressive debridement today. She does not have an arterial issue. We are using 3 layer compression although we may need to go to 4. The patient has been in for multiple changes to her wrap since I last saw her a week ago. She says that the area was leaking. I do not  have too much more information on what was found 01/19/19 on evaluation today patient was actually being seen for a nurse visit when unfortunately she had the area on her left lateral lower extremity as well as weeping from the right lower extremity that became apparent. Therefore we did end up actually seeing her for a full visit with myself. She is having some pain at this site as well but fortunately nothing too significant at this point. No fevers, chills, nausea, or vomiting noted at this time. 3/18-Patient is back to the clinic with the left leg venous leg ulcer, the ulcer is larger in size, has a surface that is densely adherent with fibrinous tissue, the Hydrofera Blue was used but is densely adherent and there was difficulty in removing it. The right lower extremity was also wrapped for weeping edema. Patient has a new area over the left lateral foot above the malleolus that is small and appears to have no debris with intact surrounding skin. Patient is on increased dose of Lasix also as a means to edema management 3/25; the patient has a nonhealing venous ulcer on the medial left leg and last week developed a smaller area on the lateral left calf. We have been using Hydrofera Blue with a contact layer. 4/1; no major change in these wounds areas. Left medial and more recently left lateral calf. I tried Iodoflex last week to aid in debridement she did not tolerate this. She stated her pain was terrible all week. She took the top layer of the 4 layer compression off. 4/8; the patient actually looks somewhat better in terms of her more prominent left lateral calf wound. There is some healthy looking tissue here. She is still complaining of a lot of discomfort. 4/15; patient in a lot of pain secondary to sciatica. She is on a prednisone taper prescribed by her primary physician. She has the 2 areas one on the left medial and more recently a smaller area on the left lateral calf. Both of these  just above the malleoli 4/22; her back pain is better but she still states she is very uncomfortable and now feels she is intolerant to the [employer]. No real change in the wounds we have been using Sorbact. She has been previously intolerant to Iodoflex. There is not a lot of option about what we can use to debride this wound under compression that she no doubt needs. sHe states Ultram no longer works for her pain 4/29; no major change in the wounds slightly increased depth. Surface on the original medial wound perhaps somewhat improved however the more recent area on the lateral left ankle is 100% covered in very adherent debris we have been using Sorbact. She tolerates 4 layer compression well and her  edema control is a lot better. She has not had to come in for a nurse check 5/6; no major change in the condition of the wounds. She did consent to debridement today which was done with some difficulty. Continuing Sorbact. She did not tolerate Iodoflex. She was in for a check of her compression the day after we wrapped her last week this was adjusted but nothing much was found 5/13; no major change in the condition or area of the wounds. I was able to get a fairly aggressive debridement done on the lateral left leg wound. Even using Sorbact under compression. She came back in on Friday to have the wrap changed. She says she felt uncomfortable on the lateral aspect of her ankle. She has a long history of chronic venous insufficiency including previous ablation surgery on this side. 5/20-Patient returns for wounds on left leg with both wounds covered in slough, with the lateral leg wound larger in size, she has been in 3 layer compression and felt more comfortable, she describes pain in ankle, in leg and pins and needles in foot, and is about to try Pamelor for this 6/3; wounds on the left lateral and left medial leg. The area medially which is the most recent of the 2 seems to have had the largest  increase in [place], EMMOGENE PARRENT. ([id number]) dimensions. We have been using Sorbac to try and debride the surface. She has been to see orthopedics they apparently did a plain x-ray that was indeterminant. Diagnosed her with neuropathy and they have ordered an MRI to determine if there is underlying osteomyelitis. This was not high on my thought list but I suppose it is prudent. We have advised her to make an appointment with vein and vascular in [place]. She has a history of a left greater saphenous and accessory vein ablations I wonder if there is anything else that can be done from a surgical point of view to help in these difficult refractory wounds. We have previously healed this wound on one occasion but it keeps on reopening [medial side] 6/10; deep tissue culture I did last week I think on the left medial wound showed both moderate E. coli and moderate staph aureus [MSSA]. She is going to require antibiotics and I have chosen Augmentin. We have been using Sorbact and we have made better looking wound surface on both sides but certainly no improvement in wound area. She was back in last Friday apparently for a dressing changes the wrap was hurting her outer left ankle. She has not managed to get a hold of vein and vascular in [place]. We are going to have to make her that appointment 6/17; patient is tolerating the Augmentin. She had an MRI that I think was ordered by orthopedic surgeon this did not show osteomyelitis or an abscess did suggest cellulitis. We have been using Sorbact to the lateral and medial ankles. We have been trying to arrange a follow-up appointment with vein and vascular in [place] or did her original ablations. We apparently an area sent the request to vein and vascular in [facility] 6/24; patient has completed the Augmentin. We do not yet have a vein and vascular appointment in [place]. I am not sure what the issue is here we have asked her to call tomorrow. We  are using Sorbact. Making some improvements and especially the medial wound. Both surfaces however look better medial and lateral. 7/1; the patient has been in contact with vein and vascular in [place] but has  not yet received an appointment. Using Sorbact we have gradually improve the wound surface with no improvement in surface area. She is approved for Apligraf but the wound surface still is not completely viable. She has not had to come in for a dressing change 7/8; the patient has an appointment with vein and vascular on 7/31 which is a Friday afternoon. She is concerned about getting back here for [place] to dress her wounds. I think it is important to have them goal for her venous reflux/history of ablations etc. to see if anything else can be done. She apparently tested positive for 1 of the blood tests with regards to lupus and saw a rheumatologist. He has raised the issue of vasculitis again. I have had this thought in the past however the evidence seems overwhelming that this is a venous reflux etiology. If the rheumatologist tells me there is clinical and laboratory investigation is positive for lupus I will rethink this. 7/15; the patient's wound surfaces are quite a bit better. The medial area which was her original wound now has no depth although the lateral wound which was the more recent area actually appears larger. Both with viable surfaces which is indeed better. Using Sorbact. I wanted to use Apligraf on her however there is the issue of the vein and vascular appointment on 7/31 at 2:00 in the afternoon which would not allow her to get back to be rewrapped and they would no doubt remove the graft 7/22; the patient's wound surfaces have moderate amount of debris although generally look better. The lateral one is larger with 2 small satellite areas superiorly. We are waiting for her vein and vascular appointment on 7/31. She has been approved for Apligraf which I would like to  use after th 7/29; wound surfaces have improved no debridement is required we have been using Sorbact. She sees vein and vascular on Friday with this so question of whether anything can be done to lessen the likelihood of recurrence and/or speed the healing of these areas. She is already had previous ablations. She no doubt has severe venous hypertension 8/5-Patient returns at 1 week, she was in [place] for 3 days by her podiatrist, we have been using so backed to the wound, she has increased pain in both the wounds on the left lower leg especially the more distal one on the lateral aspect 8/12-Patient returns at 1 week and she is agreeable to having debridement in both wounds on her left leg today. We have been using Sorbact, and vascular studies were reviewed at last visit 8/19; the patient arrives with her wounds fairly clean and no debridement is required. We have used Sorbact which is really done a nice job in cleaning up these very difficult wound surfaces. The patient saw Dr. Donzetta Matters of vascular surgery on 7/31. He did not feel that there was an arterial component. He felt that her treated greater saphenous vein is adequately addressed and that the small saphenous vein did not appear to be involved significantly. She was also noted to have deep venous reflux which is not treatable. Dr. Donzetta Matters mentioned the possibility of a central obstructive component leading to reflux and he offered her central venography. She wanted to discuss this or think about it. I have urged her to go ahead with this. She has had recurrent difficult wounds in these areas which do heal but after months in the clinic. If there is anything that can be done to reduce the likelihood of this  I think it is worth it. 9/2 she is still working towards getting follow-up with Dr. Donzetta Matters to schedule her CT. Things are quite a bit worse venography. I put Apligraf on 2 weeks ago on both wounds on the medial and lateral part of her left  lower leg. She arrives in clinic today with 3 superficial additional wounds above the area laterally and one below the wound medially. She describes a lot of discomfort. I think these are probably wrapped injuries. Does not look like she has cellulitis. 07/20/2019 on evaluation today patient appears to be doing somewhat poorly in regard to her lower extremity ulcers. She in fact showed signs of erythema in fact we may even be dealing with an infection at this time. Unfortunately I am unsure if this is just infection or if indeed there may be some allergic reaction that occurred as a result of the Apligraf application. With that being said that would be unusual but nonetheless not impossible in this patient is one who is unfortunately allergic to quite a bit. Currently we have been using the Sorbact which seems to do as well as anything for her. I do think we may want to obtain a culture today to see if there is anything showing up there that may need to be addressed. 9/16; noted that last week the wounds look worse in 1 week follow-up of the Apligraf. Using Sorbact as of 2 days ago. She arrives with copious amounts of drainage and new skin breakdown on the back of the left calf. The wounds arm more substantial bilaterally. There is a fair amount of swelling in the left calf no overt DVT there is edema present I think in the left greater than right thigh. She is supposed to go on 9/28 for CT venography. The wounds on the medial and lateral calf are worse and she has new skin breakdown posteriorly at least new for me. This is almost developing into a circumferential wound area The Apligraf was taken off last week which I agree with things are not going in the right direction a culture was done we do not have that back yet. She is on Augmentin that she started 2 days ago 9/23; dressing was changed by her nurses on Monday. In general there is no improvement in the wound areas although the area looks less  angry than last week. She did get Augmentin for MSSA cultured on the 14th. She still appears to have too much swelling in the left leg even with 3 layer compression 9/30; the patient underwent her procedure on 9/28 by Dr. Donzetta Matters at [facility]. She was discovered to have the common iliac vein measuring 12.2 mm but at the level of L4-L5 measured 3 mm. After stenting it measured 10 mm. 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 Rouleaux flow in the common femoral and femoral vein was observed much improved after stenting. We are using silver alginate to the wounds on the medial and lateral ankle on the left. 4 layer compression 10/7; the patient had fluid swelling around her knee and 4 layer compression. At the advice of vein and vascular this was reduced to 3 layer which she is tolerating better. We have been using silver alginate under 3 layer compression since last Friday 10/14; arrives with the areas on the left ankle looking a lot better. Inflammation in the area also a lot better. She came in for a nurse check on 10/9 10/21; continued nice improvement.  Slight improvements in surface area of both the medial and lateral wounds on the left. A lot of the satellite lesions in the weeping erythema around these from stasis dermatitis is resolved. We have been using silver alginate JEARLENE, SMUCK ([id number]) 10/28; general improvement in the entire wound areas although not a lot of change in dimensions the wound certainly looks better. There is a lot less in terms of venous inflammation. Continue silver alginate this week however look towards Hydrofera Blue next week 11/4; very adherent debris on the medial wound left wound is not as bad. We have been using silver alginate. Change to [facility] today 11/11; very adherent debris on both wound areas. She went to vein and vascular last week and follow-up they put in [place] boot on this today. He says the [facility]  was adherent. Wound is definitely not as good as last week. Especially on the left there the satellite lesions look more prominent 11/18; absolutely no better. erythema on lateral aspect with tenderness. 09/30/2019 on evaluation today patient appears to actually be doing better. Dr. Dellia Nims did put her on doxycycline last week which I do believe has helped her at this point. Fortunately there is no signs of active infection at this time. No fevers, chills, nausea, vomiting, or diarrhea. I do believe he may want extend the doxycycline for 7 additional days just to ensure everything does completely cleared up the patient is in agreement with that plan. Otherwise she is going require some sharp debridement today 12/2; patient is completing a 2-week course of doxycycline. I gave her this empirically for inflammation as well as infection when I last saw her 2 weeks ago. All of this seems to be better. She is using silver alginate she has the area on the medial aspect of the larger area laterally and the 2 small satellite regions laterally above the major wound. 12/9; the patient's wound on the left medial and left lateral calf look really quite good. We have been using silver alginate. She saw vein and vascular in follow-up on 10/09/2019. She has had a previous left greater saphenous vein ablation by Dr. Oscar La in 2016. More recently she underwent a left common iliac vein stent by Dr. 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 The swelling is improved and certainly the wounds have improved. The patient shows [place] today area on the right medial calf there is almost no wound but leaking lymphedema. She says she start this started 3 or 4 days ago. She did not traumatize it. It is not painful. She does not wear compression on that side 12/16; the patient continues to do well laterally. Medially still requiring debridement. The area on the right calf did not materialize to anything and is not currently  open. We wrapped this last time. She has support stockings for that leg although I am not sure they are going to provide adequate compression 12/23; the lateral wound looks stable. Medially still requiring debridement for tightly adherent fibrinous debris. We've been using silver alginate. Surface area not any different 12/30; neither wound is any better with regards to surface and the area on the left lateral is larger. I been using silver alginate to the left lateral which look quite good last week and Sorbact to the left medial 11/11/2019. Lateral wound area actually looks better and somewhat smaller. Medial still requires a very aggressive debridement today. We have been using Sorbact on both wound areas 1/13; not much better still adherent debris  bilaterally. I been using Sorbact. She has severe venous hypertension. Probably some degree of dermal fibrosis distally. I wonder whether tighter compression might help and I am going to try that today. We also need to work on the bioburden 1/20; using Sorbact. She has severe venous hypertension status post stent placement for pelvic vein compression. We applied gentamicin last time to see if we could reduce bioburden I had some discussion with her today about the use of pentoxifylline. This is occasionally used in this setting for wounds with refractory venous insufficiency. However this interacts with Plavix. She tells me that she was put on this after stent placement for 3 months. She will call Dr. Claretha Cooper office to discuss 1/27; we are using gentamicin under Sorbact. 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 She has a stent. Wound medially is measuring smaller this week. Laterally measuring slightly larger although she has some satellite lesions superiorly 2/3; gentamicin under Sorbact under 4-layer compression. 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 She has a stent on Plavix. Her wounds are  measuring smaller this week. More substantially laterally where there is a satellite lesion superiorly. 2/10; gentamicin under Sorbac. 4-layer compression. Patient communicated with Dr. Donzetta Matters at vein and vascular in [place]. He is okay with the patient coming off Plavix I will therefore start her on pentoxifylline for a 1 month trial. In general her wounds look better today. I had some concerns about swelling in the left thigh however she measures 61.5 on the right and 63 on the mid thigh which does not suggest there is any difficulty. The patient is not describing any pain. 2/17; gentamicin under Sorbac 4-layer compression. She has been on pentoxifylline for 1 week and complains of loose stool. No nausea she is eating and drinking well 2/24; the patient apparently came in 2 days ago for a nurse visit when her wrap fell down. Both areas look a little worse this week macerated medially and satellite lesions laterally. Change to silver alginate today 3/3; wounds are larger today especially medially. She also has more swelling in her foot lower leg and I even noted some swelling in her posterior thigh which is tender. I wonder about the patency of her stent. Fortuitously she sees Dr. Claretha Cooper group on Friday 3/10; Mrs. Kanak was seen by vein and vascular on 3/5. The patient underwent ultrasound. There was no evidence of thrombosis involving the IVC no evidence of thrombosis involving the right common iliac vein there is no evidence of thrombosis involving the right external iliac vein the left external vein is also patent. The right common iliac vein stent appears patent bilateral common femoral veins are compressible and appear patent. I was concerned about the left common iliac stent however it looks like this is functional. She has some edema in the posterior thigh that was tender she still has that this week. I also note they had trouble finding the pulses in her left foot and booked her for an ABI  baseline in 4 weeks. She will follow up in 6 months for repeat IVC duplex. The patient stopped the pentoxifylline because of diarrhea. It does not look like that was being effective in any case. I have advised her to go back on her aspirin 81 mg tablet, vascular it also suggested this 3/17; comes in today with her wound surfaces a lot better. The excoriations from last week considerably better probably secondary to the TCA. We have been using silver alginate 3/24; comes in  today with smaller wounds both medially and laterally. Both required debridement. There are 2 small satellite areas superiorly laterally. She also has a very odd bandlike area in the mid calf almost looking like there was a weakness in the wrap in a localized area. I would write this off as being this however anteriorly she has a small raised ballotable area that is very tender almost reminiscent of an abscess but there was no obvious purulent surface to it. 02/04/20 upon evaluation today patient appears to be doing fairly well in regard to her wounds today. Fortunately there is no signs of active infection at this time. No fevers, chills, nausea, vomiting, or diarrhea. She has been tolerating the dressing changes without complication. Fortunately I feel like she is showing signs of improvement although has been sometime since have seen her. Nonetheless the area of concern that Dr. Dellia Nims had last week where she had possibly an area of the wrap that was we can allow the leg to bulge appears to be doing significantly better today there is no signs of anything worsening. DORETHY, LALIBERTE ([id number]) 4/7; the patient's wounds on her medial and lateral left leg continue to contract. We have been using a regular alginate. Last week she developed an area on the right medial lower leg which is probably a venous ulcer as well. 4/14; the wounds on her left medial and lateral lower leg continue to contract. Surface eschar. We have been  using regular alginate. The area on the right medial lower leg is closed. We have been putting both legs under 4-layer contraction. The patient went back to see vein and vascular she had arterial studies done which were apparently "quite good" per the patient although I have not read their notes I have never felt she had an arterial issue. 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 This is been longstanding and refractory to exercise, leg elevation and longstanding use of compression wraps in our clinic as well as compression stockings on the times we have been able to get these to heal 4/21; we thought she actually might be close this week however she arrives in clinic with a lot of edema in her upper left calf and into her posterior thigh. This is been an intermittent problem here. She says the wrap fell down but it was replaced with a nurse visit on Monday. We are using calcium alginate to the wounds and the wound sizes there not terribly larger than last week but there is a lot more edema 4/28; again wound edges are smaller on both sides. Her edema is better controlled than last time. She is obtained her compression pumps from [employer] although they have not been to her home to set these up. 5/5; left medial and left lateral both look stable. I am not sure the medial is any smaller. We have been using calcium alginate under 4-layer compression. oShe had an area on the right medial. This was eschared today. We have been wrapping this as well. She does not tolerate external compression stockings due to a history of various contact allergies. She has her compression pumps however the representative from the company is coming on her to show her how to use these tomorrow 5/19; patient with severe chronic venous insufficiency secondary to central venous disease. She had a stent placed in her left common iliac vein. She has done better since but  still difficult to control wounds. She comes in today with nothing open  on the right leg. Her areas on the left medial and left lateral are just about closed. We are using calcium alginate under 4-layer compression. She is using her external compression pumps at home She only has 15-20 support stockings. States she cannot get anything tighter than that on. 03/30/20-Patient returns at 1 week, the wounds on the left leg are both slightly bigger, the last week she was on 3 layer compression which started to slide down. She is starting to use her lymphedema pumps although she stated on 1 day her right ankle started to swell up and she have to stop that day. Unfortunately the open area seem to oscillate between improving to the point of healing and then flaring up all to do with effectiveness of compression or lack of due to the left leg topography not keeping the compression wraps from rolling down 6/2 patient comes in with a 15/20 mmHg stocking on the right leg. She tells me that she developed a lot of swelling in her ankles she saw orthopedics she was felt to possibly be having a flare of pseudogout versus some other type of arthritis. She was put on steroids for a respiratory issue so that helps with the inflammation. She has not been using the pumps all week. She thinks the left thigh is more swollen than usual and I would agree with that. She has an appointment with Dr. Donzetta Matters 9 days or so from now 6/9; both wounds on the left medial and left lateral are smaller. We have been using calcium alginate under compression. She does not have an open wound on the right leg she is using a stocking and her compression pumps things are going well. She has an appointment with Dr. Donzetta Matters with regards to her stent in the left common iliac vein 6/16; the wounds on the left medial and left lateral ankle continues to contract. The patient saw Dr. Donzetta Matters and I think he seems satisfied. Ordered follow-up venous reflux  studies on both sides in September. Cautioned that she may need thigh-high stockings. She has been using calcium alginate under compression on the left and her own stocking on the right leg. She tells [place] there are no open wounds on the right 6/23; left lateral is just about closed. Medial required debridement today. We have been using calcium alginate. Extensive discussion about the compression pumps she is only using these on 25 mmHg states she could not take 40 or 30 when the wrap came out to her home to demonstrate these. He said they should not feel tight 6/30; the left lateral wound has a slight amount of eschar. . The area medially is about the same using Hydrofera Blue. 7/7; left lateral wound still has some eschar. I will remove this next week may be closed. The area medially is very small using Hydrofera Blue with improvement. Unfortunately the stockings fell down. Unfortunately the blisters have developed at the edge of where the wrap fell. When this happened she says her legs hurt she did not use her pumps. We are not open Monday for her to come in and change the wraps and she had an appointment yesterday. She also tells me that she is going to have an MRI of her back. She is having pain radiating into her left anterior leg she thinks her from an L5 disc. She saw Dr. Ellene Route of neurosurgery 7/14; the area on the left lateral ankle area is closed. Still a small area medially however it looks better as  well. We have been using Hydrofera Blue under 4-layer compression 7/21; left lateral ankle is still closed however her wound on the medial left calf is actually larger. This is probably because Hydrofera Blue got stuck to the wound. She came in for a nurse change on Friday and will do that again this week I was concerned about the amount of swelling that she had last week however she is using her compression pumps twice a day and the swelling seems well controlled 7/28; remaining wound on the  left medial lower leg is smaller. We have been using moistened silver collagen under compression she is coming back for a nurse visit. For reasons that were not really clear she was just keeping her legs elevated and not using her compression pumps. I have asked her to use the compression pumps. She does not have any wounds on the right leg 06/15/20-Patient returns at 2 weeks, her LLE edema is worse and she developed a blister wound that is new and has bigger posterior calf wound on right, we are using Prisma with pad, 4 layer compression. she has been on lasix 40 mg daily 8/18; patient arrives today with things a lot worse than I remember from a few weeks ago. She was seen last week. Noted that her edema was worse and that she now had a left lateral wound as well as deteriorating edema in the medial and posterior part of the lower leg. She says she is using his or her external compression pumps once a day although I wonder about the compliance. 8/25; weeping area on the right medial lower leg. This had actually gotten a small localized area of her compression stocking wet. oOn the left side there is a large denuded area on the posterior medial lower leg and smaller area on the lateral. This was not the original areas that we dealt with. 9/1 the patient's wound on the left leg include the left lateral and left posterior. Larger superficial wounds weeping. She has very poor edema control. Tender localized edema in the left lower medial ankle/heel probably because of localized wrap issues. She freely admits she is not using the compression pumps. She has been up on her feet a lot. She thinks the hydrofera blue is contributing to the pain she is experiencing.. This is a complaint that I have occasionally heard 9/8; really not much improvement. The patient is still complaining of a lot of pain particularly when she uses compression pumps. I switched her to silver alginate last time because she found the  Hydrofera Blue to be irritating. I don't hear much difference in her description with the silver alginate. 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 She has follow-up with Dr. Donzetta Matters I think for ultrasound next month DALEA, HENLY ([id number]) 9/15; quite a bit of improvement today. We have less edema and more epithelialization in both of her wound areas on the left medial and left lateral calf. These are not the site of her original wounds in this area. She says she has been using her compression pumps for 30 minutes twice a day, there pain issues that never quite understood. Silver alginate as the primary dressing 9/22; continued improvement. Both areas medially and laterally still have a small open area there is some eschar. She continues to complain of left medial ankle pain. Swelling in the leg is in much better condition. We have been  using silver alginate 9/29; continued improvement. Both areas medially and laterally in the left calf look as though they are close some minor surface eschar but I think this is epithelialized. She comes in today saying she has a ruptured disc at L4-L5 cannot bend over to put on her stockings. 10/6; patient comes in today with no open wounds on either leg. However her edema on the left leg in the upper one third of the lower leg is poorly controlled nonpitting. She says that she could not use the pumps for 2 days and then she has been using the last couple of days. It is not clear to me she has been able to get her stocking on. She has back problems. Mrs. Downey has severe chronic venous insufficiency with secondary lymphedema. Her venous insufficiency is partially centrally mediated and that she is now post stent in the left common iliac vein. [facility] Thurner's syndrome/physiology]. She follows up with them on 10/15. She wears 20/30 below-knee stockings. She is supposed to use  compression pumps at home although I think her compliance about with this is been less than 100%. I have asked her to use these 3 times a day. Finally I think she has lipodermatosclerosis in the left lower leg with an inverted bottle sign. It is been a major problem controlling the edema in the left leg. The right leg we have had wounds on but not as significant a problem is on the left READMISSION 04/12/2021 Mrs. Sun is a 75 year old woman we know well in this clinic. She has severe chronic venous insufficiency. 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 I believe she has had bilateral greater saphenous vein ablation in the past as well. She tells me that this wound opened sometime in March. She had a fall and thinks it was initially abrasion. She developed areas she describes as little blisters on the anterior part of her leg and she saw dermatology and was treated for methicillin staph aureus with several rounds of antibiotics. She has been using support stockings on the left leg and says this is the only thing she can get on. Her compression pump use maybe once a day she says if she did not use one she use the other. She comes in today with incredible swelling in the left leg with a wound on the left posterior calf. She has been using Neosporin to this previously a hydrocolloid. 6/15; patient arrives back for 1 week follow-up.Marland Kitchen Apparently her wrap fell down she did not call us to replace this. He has poor edema control. She only uses her compression pumps once a day 6/29; patient presents for 1 week follow-up. She has tolerated the compression wrap well. We have been using Iodoflex under the wrap. She has no issues or complaints today. 7/6; patient presents for 1 week follow-up. She states that the compression wrap rolled down her leg 4 days ago. She has been trying to keep the area covered but has no dressings at home to use. She denies signs of  infection. 7/13; patient presents for 1 week follow-up. She states that her compression wrap rolled down her right leg and she called our office and had it placed a few days ago. She has been tolerating the current wrap well. She states that the Iodoflex is causing a burning sensation. She denies signs of infection. 7/27; patient presents for 1 week follow-up She has tolerated the compression wrap well with Hydrofera  Blue underneath. She has now developed a wound to her right lower extremity. She reports having a culture done To this area by her dermatologist that she reports is negative. She currently denies signs of infection. 8/30; patient presents for 1 week follow-up. On the left side she has tolerated the compression wrap well with Hydrofera Blue underneath. She reports some discomfort to her right lower extremity with the 3 layer compression. She currently denies signs of infection. 06/14/2021 upon evaluation today patient's wound actually appears to be doing decently well based on what I am seeing currently. She actually has 2 areas 1 on the left distal/posterior lower leg and the other on the right lower leg. Subsequently the measurements are roughly the same may be slightly smaller but in general have not made any trend towards getting worse which is great news. 06/23/2021 upon evaluation today patient appears to be doing pretty well in regard to her wounds. On the right she is having difficulty with sciatic pain and this subsequently has led to her taking the wrap off over the last week her leg is more swollen she is also been on prednisone for 10 days. With that being said I think we may want to just use a compression sock on the left that way she will not have to worry about the compression wrap being in place that she cannot get off. With that being said I do believe as well that the patient is going to need to continue with the wrapping on the left we will also need to do a little bit of  sharp debridement today. 8/24; patient presents for 1 week follow-up. She has no issues or complaints today. She denies signs of infection. 8/31; patient presents for 1 week follow-up. She has no issues or complaints today. She has tolerated the compression wrap well on the left lower extremity. She denies signs of infection. Electronic Signature(s) Signed: 07/05/2021 2:30:18 PM By: Kalman Shan DO Entered By: Kalman Shan on 07/05/2021 14:26:05 Lefebre, [name] -------------------------------------------------------------------------------- Physical Exam Details Patient Name: XOLANI, GRUNDSTROM. Date of Service: 07/05/2021 1:15 PM Medical Record Number: [id number] Patient Account Number: [id number] Date of Birth/Sex: 1946-10-25 (75 y.o. F) Treating RN: Donnamarie Poag Primary Care Provider: [name] Other Clinician: Referring Provider: [name] Treating Provider/Extender: Yaakov Guthrie in Treatment: 12 Constitutional . Cardiovascular . Psychiatric . Notes Left lower extremity: To the distal medial aspect there is an open wound with granulation tissue and nonviable tissue present. No signs of infection. Right lower extremity: No open wounds Electronic Signature(s) Signed: 07/05/2021 2:30:18 PM By: Kalman Shan DO Entered By: Kalman Shan on 07/05/2021 14:26:48 Koo, [name] -------------------------------------------------------------------------------- Physician Orders Details Patient Name: JANINNE, GOERING. Date of Service: 07/05/2021 1:15 PM Medical Record Number: [id number] Patient Account Number: [id number] Date of Birth/Sex: [birth year] (75 y.o. F) Treating RN: Donnamarie Poag Primary Care Provider: [name] Other Clinician: Referring Provider: [name] Treating Provider/Extender: Yaakov Guthrie in Treatment: 12 Verbal / Phone Orders: No Diagnosis Coding Follow-up Appointments Wound #13  Left,Distal,Posterior Lower Leg o Return Appointment in 1 week. o Nurse Visit as needed Bathing/ Shower/ Hygiene o May shower with wound dressing protected with water repellent cover or cast protector. Anesthetic (Use 'Patient Medications' Section for Anesthetic Order Entry) o Lidocaine applied to wound bed Edema Control - Lymphedema / Segmental Compressive Device / Other o Patient to wear own compression stockings. Remove compression stockings every night before going to bed and put on every morning when  getting up. - right leg o Elevate, Exercise Daily and Avoid Standing for Long Periods of Time. o Elevate legs to the level of the heart and pump ankles as often as possible o Elevate leg(s) parallel to the floor when sitting. o Compression Pump: Use compression pump on left lower extremity for 60 minutes, twice daily. - TWICE DAILY for 1 hour o Compression Pump: Use compression pump on right lower extremity for 60 minutes, twice daily. - TWICE DAILY for 1 hour Wound Treatment Wound #13 - Lower Leg Wound Laterality: Left, Posterior, Distal Cleanser: Soap and Water 1 x Per Week/30 Days Discharge Instructions: Gently cleanse wound with antibacterial soap, rinse and pat dry prior to dressing wounds Topical: Triamcinolone Acetonide Cream, 0.1%, 15 (g) tube 1 x Per Week/30 Days Discharge Instructions: Peri wound Apply as directed by provider. Primary Dressing: Prisma 4.34 (in) 1 x Per Week/30 Days Discharge Instructions: Moisten w/normal saline or sterile water; Cover wound as directed. Do not remove from wound bed. Secondary Dressing: ABD Pad 5x9 (in/in) 1 x Per Week/30 Days Discharge Instructions: Cover with ABD pad Compression Wrap: Medichoice 4 layer Compression System, 35-40 mmHG 1 x Per Week/30 Days Discharge Instructions: Apply multi-layer wrap as directed. Wound #14 - Lower Leg Wound Laterality: Right Cleanser: Soap and Water 1 x Per Week/30 Days Discharge  Instructions: Gently cleanse wound with antibacterial soap, rinse and pat dry prior to dressing wounds Topical: Triamcinolone Acetonide Cream, 0.1%, 15 (g) tube 1 x Per Week/30 Days Discharge Instructions: Peri wound Apply as directed by provider. Secondary Dressing: Non-Adherent Pad 3x8 (in/in) 1 x Per Week/30 Days Compression Wrap: coban 1 x Per Week/30 Days Electronic Signature(s) Signed: 07/05/2021 2:30:18 PM By: Kalman Shan DO Signed: 07/05/2021 3:14:25 PM By: Kelly Splinter ([id number]) Entered By: Donnamarie Poag on 07/05/2021 14:06:41 Briscoe, [name] -------------------------------------------------------------------------------- Problem List Details Patient Name: [name], ELWARD. Date of Service: 07/05/2021 1:15 PM Medical Record Number: [id number] Patient Account Number: [id number] Date of Birth/Sex: 04/22/1946 (75 y.o. F) Treating RN: Donnamarie Poag Primary Care Provider: [name] Other Clinician: Referring Provider: [name] Treating Provider/Extender: Yaakov Guthrie in Treatment: 12 Active Problems ICD-10 Encounter Code Description Active Date MDM Diagnosis I87.332 Chronic venous hypertension (idiopathic) with ulcer and inflammation of 04/12/2021 No Yes left lower extremity I89.0 Lymphedema, not elsewhere classified 04/12/2021 No Yes L97.221 Non-pressure chronic ulcer of left calf limited to breakdown of skin 04/12/2021 No Yes S81.801A Unspecified open wound, right lower leg, initial encounter 05/31/2021 No Yes Inactive Problems Resolved Problems Electronic Signature(s) Signed: 07/05/2021 2:30:18 PM By: Kalman Shan DO Entered By: Kalman Shan on 07/05/2021 14:24:58 Momon, Taetum Lenna Sciara ([id number]) -------------------------------------------------------------------------------- Progress Note/History and Physical Details Patient Name: STEFFI, BARSNESS. Date of Service: 07/05/2021 1:15 PM Medical Record Number:  [id number] Patient Account Number: [id number] Date of Birth/Sex: April 23, 1946 (75 y.o. F) Treating RN: Donnamarie Poag Primary Care Provider: [name] Other Clinician: Referring Provider: [name] Treating Provider/Extender: Yaakov Guthrie in Treatment: 12 Subjective Chief Complaint Information obtained from Patient Left calf venous stasis ulceration. 11/13/16; the patient is here for a left calf recurrent ulceration which is painful and has been present for the last month 01/22/17; the patient re-presents here for recurrent difficulties with blistering and leaking fluid on her left posterior calf 12/10/18; the patient returns to clinic for a wound on the left medial calf 04/12/2021; left lower extremity wound, 05/31/21; right leg wound History of Present Illness (HPI) Pleasant 75 year old with history of chronic venous insufficiency. No  diabetes or peripheral vascular disease. Left ABI 1.29. Questionable history of left lower extremity DVT. She developed a recurrent ulceration on her left lateral calf in December 2015, which she attributes to poor diet and subsequent lower extremity edema. She underwent endovenous laser ablation of her left greater saphenous vein in 2010. She underwent laser ablation of accessory branch of left GSV in April 2016 by Dr. Kellie Simmering at [facility]. She was previously wearing Unna boots, which she tolerated well. Tolerating 2 layer compression and cadexomer iodine. She returns to clinic for follow-up and is without new complaints. She denies any significant pain at this time. She reports persistent pain with pressure. No claudication or ischemic rest pain. No fever or chills. No drainage. READMISSION 11/13/16; this is a 75 year old woman who is not a diabetic. She is here for a review of a painful area on her left medial lower extremity. I note that she was seen here previously last year for wound I believe to be in the same area. At that time she had  undergone previously a left greater saphenous vein ablation by Dr. Kellie Simmering and she had a ablation of the anterior accessory branch of the left greater saphenous vein in March 2016. Seeing that the wound actually closed over. In reviewing the history with her today the ulcer in this area has been recurrent. She describes a biopsy of this area in 2009 that only showed stasis physiology. She also has a history of today malignant melanoma in the right shoulder for which she follows with Dr. 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 Do not see that she has ever had arterial studies in the left leg. She tells me she has a follow-up with Dr. Kellie Simmering in roughly 10 days In any case she developed the reopening of this area roughly a month ago. On the background of this she describes rapidly increasing edema which has responded to Lasix 40 mg and metolazone 2.5 mg as well as the patient's lymph massage. She has been told she has both venous insufficiency and lymphedema but she cannot tolerate compression stockings 11/28/16; the patient saw Dr. Kellie Simmering recently. Per the patient he did arterial Dopplers in the office that did not show evidence of arterial insufficiency, per the patient he stated "treat this like an ordinary venous ulcer". She also saw her dermatologist Dr. Ronnald Ramp who felt that this was more of a vascular ulcer. In general things are improving although she arrives today with increasing bilateral lower extremity edema with weeping a deeper fluid through the wound on the left medial leg compatible with some degree of lymphedema 12/04/16; the patient's wound is fully epithelialized but I don't think fully healed. We will do another week of depression with Promogran and TCA however I suspect we'll be able to discharge her next week. This is a very unusual-looking wound which was initially a  figure-of-eight type wound lying on its side surrounded by petechial like hemorrhage. She has had venous ablation on this side. She apparently does not have an arterial issue per Dr. Kellie Simmering. She saw her dermatologist thought it was "vascular". Patient is definitely going to need ongoing compression and I talked about this with her today she will go to elastic therapy after she leaves here next week 12/11/16; the patient's wound is not completely closed today. She has surrounding scar tissue and in further discussion with the  patient it would appear that she had ulcers in this area in 2009 for a prolonged period of time ultimately requiring a punch biopsy of this area that only showed venous insufficiency. I did not previously pickup on this part of the history from the patient. 12/18/16; the patient's wound is completely epithelialized. There is no open area here. She has significant bilateral venous insufficiency with secondary lymphedema to a mild-to-moderate degree she does not have compression stockings.. She did not say anything to me when I was in the room, she told our intake nurse that she was still having pain in this area. This isn't unusual recurrent small open area. She is going to go to elastic therapy to obtain compression stockings. 12/25/16; the patient's wound is fully epithelialized. There is no open area here. The patient describes some continued episodic discomfort in this area medial left calf. However everything looks fine and healed here. She is been to elastic therapy and caught herself 15-20 mmHg stockings, they apparently were having trouble getting 20-30 mm stockings in her size 01/22/17; this is a patient we discharged from the clinic a month ago. She has a recurrent open wound on her medial left calf. She had 15 mm support stockings. I told her I thought she needed 20-30 mm compression stockings. She tells me that she has been ill with hospitalization secondary to asthma and  is been found to have severe hypokalemia likely secondary to a combination of Lasix and metolazone. This morning she noted blistering and leaking fluid on the posterior part of her left leg. She called our intake nurse urgently and we was saw her this afternoon. She has not had any real discomfort here. I don't know that she's been wearing any stockings on this leg for at least 2-3 days. ABIs in this clinic were 1.21 on the right and 1.3 on the left. She is previously seen vascular surgery who does not think that there is a peripheral arterial issue. 01/30/17; Patient arrives with no open wound on the left leg. She has been to elastic therapy and obtained 20-30mhg below knee stockings and she has one on the right leg today. READMISSION 02/19/18; this PFlameris a now 75year old patient we've had in this clinic perhaps 3 times before. I had last looked at her from January 07 December 2016 with an area on the medial left leg. We discharged her on 12/25/16 however she had to be readmitted on 01/22/17 with a recurrence. I have in my notes that we discharged her on 20-30 mm stockings although she tells me she was only wearing support hose because she cannot get stockings on predominantly related to her cervical spine surgery/issues. She has [place] on the left with an anterior accessory branch ablation I think both of these were in 2016. On one of the previous visit she had a biopsy noted 2009 that was negative. She is not felt to have an arterial issue. She is not a diabetic. She does have a PSHAYANNE, VADER ([id number] history of obstructive sleep apnea hypertension asthma as well as chronic venous insufficiency and lymphedema. On this occasion she noted 2 dry scaly patch on her left leg. She tried to put lotion on this it didn't really help. There were 2 open areas.the patient has been seeing her primary  physician from 02/05/18 through 02/14/18. She had Unna boots applied. The superior wound now on the lateral left  leg has closed but she's had one wound that remains open on the lateral left leg. This is not the same spot as we dealt with in 2018. ABIs in this clinic were 1.3 bilaterally 02/26/18; patient has a small wound on the left lateral calf. Dimensions are down. She has chronic venous insufficiency and lymphedema. 03/05/18; small open area on the left lateral calf. Dimensions are down. Tightly adherent necrotic debris over the surface of the wound which was difficult to remove. Also the dressing [over collagen] stuck to the wound surface. This was removed with some difficulty as well. Change the primary dressing to Hydrofera Blue ready 03/12/18; small open area on the left lateral calf. Comes in with tightly adherent surface eschar as well as some adherent Hydrofera Blue. 03/19/18; open area on the left lateral calf. Again adherent surface eschar as well as some adherent Hydrofera Blue nonviable subcutaneous tissue. She complained of pain all week even with the reduction from 4-3 layer compression I put on last week. Also she had an increase in her ankle and calf measurements probably related to the same thing. 03/26/18; open area on the left lateral calf. A very small open area remains here. We used silver alginate starting last week as the Hydrofera Blue seem to stick to the wound bed. In using 4-layer compression 04/02/18; the open area in the left lateral calf at some adherent slough which I removed there is no open area here. We are able to transition her into her own compression stocking. Truthfully I think this is probably his support hose. However this does not maintain skin integrity will be limited. She cannot put over the toe compression stockings on because of neck problems hand problems etc. She is allergic to the lining layer of juxta lites. We might be forced to use extremitease stocking  should this fail READMIT 11/24/2018 Patient is now a 75 year old woman who is not a diabetic. She has been in this clinic on at least 3 previous occasions largely with recurrent wounds on her left leg secondary to chronic venous insufficiency with secondary lymphedema. Her situation is complicated by inability to get stockings on and an allergy to neoprene which is apparently a component and at least juxta lites and other stockings. As a result she really has not been wearing any stockings on her legs. She tells [place] that roughly 2 or 3 weeks ago she started noticing a stinging sensation just above her ankle on the left medial aspect. She has been diagnosed with pseudogout and she wondered whether this was what she was experiencing. She tried to dress this with something she bought at the store however subsequently it pulled skin off and now she has an open wound that is not improving. She has been using Vaseline gauze with a cover bandage. She saw her primary doctor last week who put an Haematologist on her. ABIs in this clinic was 1.03 on the left 2/12; the area is on the left medial ankle. Odd-looking wound with what looks to be surface epithelialization but a multitude of small petechial openings. This clearly not closed yet. We have been using silver alginate under 3 layer compression with TCA 2/19; the wound area did not look quite as good this week. Necrotic debris over the majority of the wound surface which required debridement. She continues to have a multitude of what looked to be small petechial openings. She reminds [place] that she had a biopsy on this initially during her first outbreak in 2015  in [facility] dermatology. She expresses concern about this being a possible melanoma. 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 I assured her that this does not look anything like melanoma. Except for the petechial  reaction it does look like a venous insufficiency area and she certainly has evidence of this on both sides 2/26; a difficult area on the left medial ankle. The patient clearly has chronic venous hypertension with some degree of lymphedema. The odd thing about the area is the small petechial hemorrhages. I am not really sure how to explain this. This was present last time and this is not a compression injury. We have been using Hydrofera Blue which I changed to last week 3/4; still using Hydrofera Blue. Aggressive debridement today. She does not have known arterial issues. She has seen Dr. Kellie Simmering at [facility] vein and vascular and and has an ablation on the left. [Anterior accessory branch of the greater saphenous]. From what I remember they did not feel she had an arterial issue. The patient has had this area biopsied in 2009 at [facility] dermatology and by her recollection they said this was "stasis". She is also follow-up with dermatology locally who thought that this was more of a vascular issue 3/11; using Hydrofera Blue. Aggressive debridement today. She does not have an arterial issue. We are using 3 layer compression although we may need to go to 4. The patient has been in for multiple changes to her wrap since I last saw her a week ago. She says that the area was leaking. I do not have too much more information on what was found 01/19/19 on evaluation today patient was actually being seen for a nurse visit when unfortunately she had the area on her left lateral lower extremity as well as weeping from the right lower extremity that became apparent. Therefore we did end up actually seeing her for a full visit with myself. She is having some pain at this site as well but fortunately nothing too significant at this point. No fevers, chills, nausea, or vomiting noted at this time. 3/18-Patient is back to the clinic with the left leg venous leg ulcer, the ulcer is larger in size, has a surface that  is densely adherent with fibrinous tissue, the Hydrofera Blue was used but is densely adherent and there was difficulty in removing it. The right lower extremity was also wrapped for weeping edema. Patient has a new area over the left lateral foot above the malleolus that is small and appears to have no debris with intact surrounding skin. Patient is on increased dose of Lasix also as a means to edema management 3/25; the patient has a nonhealing venous ulcer on the medial left leg and last week developed a smaller area on the lateral left calf. We have been using Hydrofera Blue with a contact layer. 4/1; no major change in these wounds areas. Left medial and more recently left lateral calf. I tried Iodoflex last week to aid in debridement she did not tolerate this. She stated her pain was terrible all week. She took the top layer of the 4 layer compression off. 4/8; the patient actually looks somewhat better in terms of her more prominent left lateral calf wound. There is some healthy looking tissue here. She is still complaining of a lot of discomfort. 4/15; patient in a lot of pain secondary to sciatica. She is on a prednisone taper prescribed by  her primary physician. She has the 2 areas one on the left medial and more recently a smaller area on the left lateral calf. Both of these just above the malleoli 4/22; her back pain is better but she still states she is very uncomfortable and now feels she is intolerant to the [employer]. No real change in the wounds we have been using Sorbact. She has been previously intolerant to Iodoflex. There is not a lot of option about what we can use to debride this wound under compression that she no doubt needs. sHe states Ultram no longer works for her pain 4/29; no major change in the wounds slightly increased depth. Surface on the original medial wound perhaps somewhat improved however the more recent area on the lateral left ankle is 100% covered in very  adherent debris we have been using Sorbact. She tolerates 4 layer compression well and her edema control is a lot better. She has not had to come in for a nurse check 5/6; no major change in the condition of the wounds. She did consent to debridement today which was done with some difficulty. Continuing Sorbact. She did not tolerate Iodoflex. She was in for a check of her compression the day after we wrapped her last week this was adjusted but RUBEE, CONWILL. ([id number]) nothing much was found 5/13; no major change in the condition or area of the wounds. I was able to get a fairly aggressive debridement done on the lateral left leg wound. Even using Sorbact under compression. She came back in on Friday to have the wrap changed. She says she felt uncomfortable on the lateral aspect of her ankle. She has a long history of chronic venous insufficiency including previous ablation surgery on this side. 5/20-Patient returns for wounds on left leg with both wounds covered in slough, with the lateral leg wound larger in size, she has been in 3 layer compression and felt more comfortable, she describes pain in ankle, in leg and pins and needles in foot, and is about to try Pamelor for this 6/3; wounds on the left lateral and left medial leg. The area medially which is the most recent of the 2 seems to have had the largest increase in dimensions. We have been using Sorbac to try and debride the surface. She has been to see orthopedics they apparently did a plain x-ray that was indeterminant. Diagnosed her with neuropathy and they have ordered an MRI to determine if there is underlying osteomyelitis. This was not high on my thought list but I suppose it is prudent. We have advised her to make an appointment with vein and vascular in [place]. She has a history of a left greater saphenous and accessory vein ablations I wonder if there is anything else that can be done from a surgical point of view to help in  these difficult refractory wounds. We have previously healed this wound on one occasion but it keeps on reopening [medial side] 6/10; deep tissue culture I did last week I think on the left medial wound showed both moderate E. coli and moderate staph aureus [MSSA]. She is going to require antibiotics and I have chosen Augmentin. We have been using Sorbact and we have made better looking wound surface on both sides but certainly no improvement in wound area. She was back in last Friday apparently for a dressing changes the wrap was hurting her outer left ankle. She has not managed to get a hold of vein and vascular  in [place]. We are going to have to make her that appointment 6/17; patient is tolerating the Augmentin. She had an MRI that I think was ordered by orthopedic surgeon this did not show osteomyelitis or an abscess did suggest cellulitis. We have been using Sorbact to the lateral and medial ankles. We have been trying to arrange a follow-up appointment with vein and vascular in [place] or did her original ablations. We apparently an area sent the request to vein and vascular in [facility] 6/24; patient has completed the Augmentin. We do not yet have a vein and vascular appointment in [place]. I am not sure what the issue is here we have asked her to call tomorrow. We are using Sorbact. Making some improvements and especially the medial wound. Both surfaces however look better medial and lateral. 7/1; the patient has been in contact with vein and vascular in [place] but has not yet received an appointment. Using Sorbact we have gradually improve the wound surface with no improvement in surface area. She is approved for Apligraf but the wound surface still is not completely viable. She has not had to come in for a dressing change 7/8; the patient has an appointment with vein and vascular on 7/31 which is a Friday afternoon. She is concerned about getting back here for [place] to dress  her wounds. I think it is important to have them goal for her venous reflux/history of ablations etc. to see if anything else can be done. She apparently tested positive for 1 of the blood tests with regards to lupus and saw a rheumatologist. He has raised the issue of vasculitis again. I have had this thought in the past however the evidence seems overwhelming that this is a venous reflux etiology. If the rheumatologist tells me there is clinical and laboratory investigation is positive for lupus I will rethink this. 7/15; the patient's wound surfaces are quite a bit better. The medial area which was her original wound now has no depth although the lateral wound which was the more recent area actually appears larger. Both with viable surfaces which is indeed better. Using Sorbact. I wanted to use Apligraf on her however there is the issue of the vein and vascular appointment on 7/31 at 2:00 in the afternoon which would not allow her to get back to be rewrapped and they would no doubt remove the graft 7/22; the patient's wound surfaces have moderate amount of debris although generally look better. The lateral one is larger with 2 small satellite areas superiorly. We are waiting for her vein and vascular appointment on 7/31. She has been approved for Apligraf which I would like to use after th 7/29; wound surfaces have improved no debridement is required we have been using Sorbact. She sees vein and vascular on Friday with this so question of whether anything can be done to lessen the likelihood of recurrence and/or speed the healing of these areas. She is already had previous ablations. She no doubt has severe venous hypertension 8/5-Patient returns at 1 week, she was in [place] for 3 days by her podiatrist, we have been using so backed to the wound, she has increased pain in both the wounds on the left lower leg especially the more distal one on the lateral aspect 8/12-Patient returns at 1 week  and she is agreeable to having debridement in both wounds on her left leg today. We have been using Sorbact, and vascular studies were reviewed at last visit 8/19; the patient arrives  with her wounds fairly clean and no debridement is required. We have used Sorbact which is really done a nice job in cleaning up these very difficult wound surfaces. The patient saw Dr. Donzetta Matters of vascular surgery on 7/31. He did not feel that there was an arterial component. He felt that her treated greater saphenous vein is adequately addressed and that the small saphenous vein did not appear to be involved significantly. She was also noted to have deep venous reflux which is not treatable. Dr. Donzetta Matters mentioned the possibility of a central obstructive component leading to reflux and he offered her central venography. She wanted to discuss this or think about it. I have urged her to go ahead with this. She has had recurrent difficult wounds in these areas which do heal but after months in the clinic. If there is anything that can be done to reduce the likelihood of this I think it is worth it. 9/2 she is still working towards getting follow-up with Dr. Donzetta Matters to schedule her CT. Things are quite a bit worse venography. I put Apligraf on 2 weeks ago on both wounds on the medial and lateral part of her left lower leg. She arrives in clinic today with 3 superficial additional wounds above the area laterally and one below the wound medially. She describes a lot of discomfort. I think these are probably wrapped injuries. Does not look like she has cellulitis. 07/20/2019 on evaluation today patient appears to be doing somewhat poorly in regard to her lower extremity ulcers. She in fact showed signs of erythema in fact we may even be dealing with an infection at this time. Unfortunately I am unsure if this is just infection or if indeed there may be some allergic reaction that occurred as a result of the Apligraf application. With  that being said that would be unusual but nonetheless not impossible in this patient is one who is unfortunately allergic to quite a bit. Currently we have been using the Sorbact which seems to do as well as anything for her. I do think we may want to obtain a culture today to see if there is anything showing up there that may need to be addressed. 9/16; noted that last week the wounds look worse in 1 week follow-up of the Apligraf. Using Sorbact as of 2 days ago. She arrives with copious amounts of drainage and new skin breakdown on the back of the left calf. The wounds arm more substantial bilaterally. There is a fair amount of swelling in the left calf no overt DVT there is edema present I think in the left greater than right thigh. She is supposed to go on 9/28 for CT venography. The wounds on the medial and lateral calf are worse and she has new skin breakdown posteriorly at least new for me. This is almost developing into a circumferential wound area The Apligraf was taken off last week which I agree with things are not going in the right direction a culture was done we do not have that back yet. She is on Augmentin that she started 2 days ago 9/23; dressing was changed by her nurses on Monday. In general there is no improvement in the wound areas although the area looks less angry than last week. She did get Augmentin for MSSA cultured on the 14th. She still appears to have too much swelling in the left leg even with 3 layer compression 9/30; the patient underwent her procedure on 9/28 by Dr. Donzetta Matters at  vascular and vein specialist. She was discovered to have the common iliac vein measuring 12.2 mm but at the level of L4-L5 measured 3 mm. After stenting it measured 10 mm. 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 Rouleaux flow in the common femoral and femoral vein was observed much improved after stenting. We are using silver alginate to the wounds on the medial and lateral  ankle on the left. 4 layer compression AISHANI, PETRASEK ([id number]) 10/7; the patient had fluid swelling around her knee and 4 layer compression. At the advice of vein and vascular this was reduced to 3 layer which she is tolerating better. We have been using silver alginate under 3 layer compression since last Friday 10/14; arrives with the areas on the left ankle looking a lot better. Inflammation in the area also a lot better. She came in for a nurse check on 10/9 10/21; continued nice improvement. Slight improvements in surface area of both the medial and lateral wounds on the left. A lot of the satellite lesions in the weeping erythema around these from stasis dermatitis is resolved. We have been using silver alginate 10/28; general improvement in the entire wound areas although not a lot of change in dimensions the wound certainly looks better. There is a lot less in terms of venous inflammation. Continue silver alginate this week however look towards Hydrofera Blue next week 11/4; very adherent debris on the medial wound left wound is not as bad. We have been using silver alginate. Change to [facility] today 11/11; very adherent debris on both wound areas. She went to vein and vascular last week and follow-up they put in [place] boot on this today. He says the [facility] was adherent. Wound is definitely not as good as last week. Especially on the left there the satellite lesions look more prominent 11/18; absolutely no better. erythema on lateral aspect with tenderness. 09/30/2019 on evaluation today patient appears to actually be doing better. Dr. Dellia Nims did put her on doxycycline last week which I do believe has helped her at this point. Fortunately there is no signs of active infection at this time. No fevers, chills, nausea, vomiting, or diarrhea. I do believe he may want extend the doxycycline for 7 additional days just to ensure everything does completely cleared up the patient is  in agreement with that plan. Otherwise she is going require some sharp debridement today 12/2; patient is completing a 2-week course of doxycycline. I gave her this empirically for inflammation as well as infection when I last saw her 2 weeks ago. All of this seems to be better. She is using silver alginate she has the area on the medial aspect of the larger area laterally and the 2 small satellite regions laterally above the major wound. 12/9; the patient's wound on the left medial and left lateral calf look really quite good. We have been using silver alginate. She saw vein and vascular in follow-up on 10/09/2019. She has had a previous left greater saphenous vein ablation by Dr. Oscar La in 2016. More recently she underwent a left common iliac vein stent by Dr. 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 The swelling is improved and certainly the wounds have improved. The patient shows [place] today area on the right medial calf there is almost no wound but leaking lymphedema. She says she start this started 3 or 4 days ago. She did not traumatize it. It is not painful. She does not wear compression  on that side 12/16; the patient continues to do well laterally. Medially still requiring debridement. The area on the right calf did not materialize to anything and is not currently open. We wrapped this last time. She has support stockings for that leg although I am not sure they are going to provide adequate compression 12/23; the lateral wound looks stable. Medially still requiring debridement for tightly adherent fibrinous debris. We've been using silver alginate. Surface area not any different 12/30; neither wound is any better with regards to surface and the area on the left lateral is larger. I been using silver alginate to the left lateral which look quite good last week and Sorbact to the left medial 11/11/2019. Lateral wound area actually looks better and somewhat smaller. Medial still  requires a very aggressive debridement today. We have been using Sorbact on both wound areas 1/13; not much better still adherent debris bilaterally. I been using Sorbact. She has severe venous hypertension. Probably some degree of dermal fibrosis distally. I wonder whether tighter compression might help and I am going to try that today. We also need to work on the bioburden 1/20; using Sorbact. She has severe venous hypertension status post stent placement for pelvic vein compression. We applied gentamicin last time to see if we could reduce bioburden I had some discussion with her today about the use of pentoxifylline. This is occasionally used in this setting for wounds with refractory venous insufficiency. However this interacts with Plavix. She tells me that she was put on this after stent placement for 3 months. She will call Dr. Claretha Cooper office to discuss 1/27; we are using gentamicin under Sorbact. 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 She has a stent. Wound medially is measuring smaller this week. Laterally measuring slightly larger although she has some satellite lesions superiorly 2/3; gentamicin under Sorbact under 4-layer compression. 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 She has a stent on Plavix. Her wounds are measuring smaller this week. More substantially laterally where there is a satellite lesion superiorly. 2/10; gentamicin under Sorbac. 4-layer compression. Patient communicated with Dr. Donzetta Matters at vein and vascular in [place]. He is okay with the patient coming off Plavix I will therefore start her on pentoxifylline for a 1 month trial. In general her wounds look better today. I had some concerns about swelling in the left thigh however she measures 61.5 on the right and 63 on the mid thigh which does not suggest there is any difficulty. The patient is not describing any pain. 2/17; gentamicin under Sorbac 4-layer  compression. She has been on pentoxifylline for 1 week and complains of loose stool. No nausea she is eating and drinking well 2/24; the patient apparently came in 2 days ago for a nurse visit when her wrap fell down. Both areas look a little worse this week macerated medially and satellite lesions laterally. Change to silver alginate today 3/3; wounds are larger today especially medially. She also has more swelling in her foot lower leg and I even noted some swelling in her posterior thigh which is tender. I wonder about the patency of her stent. Fortuitously she sees Dr. Claretha Cooper group on Friday 3/10; Mrs. Krygowski was seen by vein and vascular on 3/5. The patient underwent ultrasound. There was no evidence of thrombosis involving the IVC no evidence of thrombosis involving the right common iliac vein there is no evidence of thrombosis involving the right external iliac vein the left external vein is also patent.  The right common iliac vein stent appears patent bilateral common femoral veins are compressible and appear patent. I was concerned about the left common iliac stent however it looks like this is functional. She has some edema in the posterior thigh that was tender she still has that this week. I also note they had trouble finding the pulses in her left foot and booked her for an ABI baseline in 4 weeks. She will follow up in 6 months for repeat IVC duplex. The patient stopped the pentoxifylline because of diarrhea. It does not look like that was being effective in any case. I have advised her to go back on her aspirin 81 mg tablet, vascular it also suggested this 3/17; comes in today with her wound surfaces a lot better. The excoriations from last week considerably better probably secondary to the TCA. We have been using silver alginate 3/24; comes in today with smaller wounds both medially and laterally. Both required debridement. There are 2 small satellite areas  superiorly laterally. She also has a very odd bandlike area in the mid calf almost looking like there was a weakness in the wrap in a localized area. I would write this off as being this however anteriorly she has a small raised ballotable area that is very tender almost reminiscent of an abscess but there was no DIAN, GROSMAN. ([id number]) obvious purulent surface to it. 02/04/20 upon evaluation today patient appears to be doing fairly well in regard to her wounds today. Fortunately there is no signs of active infection at this time. No fevers, chills, nausea, vomiting, or diarrhea. She has been tolerating the dressing changes without complication. Fortunately I feel like she is showing signs of improvement although has been sometime since have seen her. Nonetheless the area of concern that Dr. Dellia Nims had last week where she had possibly an area of the wrap that was we can allow the leg to bulge appears to be doing significantly better today there is no signs of anything worsening. 4/7; the patient's wounds on her medial and lateral left leg continue to contract. We have been using a regular alginate. Last week she developed an area on the right medial lower leg which is probably a venous ulcer as well. 4/14; the wounds on her left medial and lateral lower leg continue to contract. Surface eschar. We have been using regular alginate. The area on the right medial lower leg is closed. We have been putting both legs under 4-layer contraction. The patient went back to see vein and vascular she had arterial studies done which were apparently "quite good" per the patient although I have not read their notes I have never felt she had an arterial issue. 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 This is been longstanding and refractory to exercise, leg elevation and longstanding use of compression wraps in our clinic as well as compression stockings on the times  we have been able to get these to heal 4/21; we thought she actually might be close this week however she arrives in clinic with a lot of edema in her upper left calf and into her posterior thigh. This is been an intermittent problem here. She says the wrap fell down but it was replaced with a nurse visit on Monday. We are using calcium alginate to the wounds and the wound sizes there not terribly larger than last week but there is a lot more edema 4/28; again wound edges are smaller on both sides.  Her edema is better controlled than last time. She is obtained her compression pumps from [employer] although they have not been to her home to set these up. 5/5; left medial and left lateral both look stable. I am not sure the medial is any smaller. We have been using calcium alginate under 4-layer compression. She had an area on the right medial. This was eschared today. We have been wrapping this as well. She does not tolerate external compression stockings due to a history of various contact allergies. She has her compression pumps however the representative from the company is coming on her to show her how to use these tomorrow 5/19; patient with severe chronic venous insufficiency secondary to central venous disease. She had a stent placed in her left common iliac vein. She has done better since but still difficult to control wounds. She comes in today with nothing open on the right leg. Her areas on the left medial and left lateral are just about closed. We are using calcium alginate under 4-layer compression. She is using her external compression pumps at home She only has 15-20 support stockings. States she cannot get anything tighter than that on. 03/30/20-Patient returns at 1 week, the wounds on the left leg are both slightly bigger, the last week she was on 3 layer compression which started to slide down. She is starting to use her lymphedema pumps although she stated on 1 day her right  ankle started to swell up and she have to stop that day. Unfortunately the open area seem to oscillate between improving to the point of healing and then flaring up all to do with effectiveness of compression or lack of due to the left leg topography not keeping the compression wraps from rolling down 6/2 patient comes in with a 15/20 mmHg stocking on the right leg. She tells me that she developed a lot of swelling in her ankles she saw orthopedics she was felt to possibly be having a flare of pseudogout versus some other type of arthritis. She was put on steroids for a respiratory issue so that helps with the inflammation. She has not been using the pumps all week. She thinks the left thigh is more swollen than usual and I would agree with that. She has an appointment with Dr. Donzetta Matters 9 days or so from now 6/9; both wounds on the left medial and left lateral are smaller. We have been using calcium alginate under compression. She does not have an open wound on the right leg she is using a stocking and her compression pumps things are going well. She has an appointment with Dr. Donzetta Matters with regards to her stent in the left common iliac vein 6/16; the wounds on the left medial and left lateral ankle continues to contract. The patient saw Dr. Donzetta Matters and I think he seems satisfied. Ordered follow-up venous reflux studies on both sides in September. Cautioned that she may need thigh-high stockings. She has been using calcium alginate under compression on the left and her own stocking on the right leg. She tells [place] there are no open wounds on the right 6/23; left lateral is just about closed. Medial required debridement today. We have been using calcium alginate. Extensive discussion about the compression pumps she is only using these on 25 mmHg states she could not take 40 or 30 when the wrap came out to her home to demonstrate these. He said they should not feel tight 6/30; the left lateral wound has a  slight  amount of eschar. . The area medially is about the same using Hydrofera Blue. 7/7; left lateral wound still has some eschar. I will remove this next week may be closed. The area medially is very small using Hydrofera Blue with improvement. Unfortunately the stockings fell down. Unfortunately the blisters have developed at the edge of where the wrap fell. When this happened she says her legs hurt she did not use her pumps. We are not open Monday for her to come in and change the wraps and she had an appointment yesterday. She also tells me that she is going to have an MRI of her back. She is having pain radiating into her left anterior leg she thinks her from an L5 disc. She saw Dr. Ellene Route of neurosurgery 7/14; the area on the left lateral ankle area is closed. Still a small area medially however it looks better as well. We have been using Hydrofera Blue under 4-layer compression 7/21; left lateral ankle is still closed however her wound on the medial left calf is actually larger. This is probably because Hydrofera Blue got stuck to the wound. She came in for a nurse change on Friday and will do that again this week I was concerned about the amount of swelling that she had last week however she is using her compression pumps twice a day and the swelling seems well controlled 7/28; remaining wound on the left medial lower leg is smaller. We have been using moistened silver collagen under compression she is coming back for a nurse visit. For reasons that were not really clear she was just keeping her legs elevated and not using her compression pumps. I have asked her to use the compression pumps. She does not have any wounds on the right leg 06/15/20-Patient returns at 2 weeks, her LLE edema is worse and she developed a blister wound that is new and has bigger posterior calf wound on right, we are using Prisma with pad, 4 layer compression. she has been on lasix 40 mg daily 8/18; patient arrives today  with things a lot worse than I remember from a few weeks ago. She was seen last week. Noted that her edema was worse and that she now had a left lateral wound as well as deteriorating edema in the medial and posterior part of the lower leg. She says she is using his or her external compression pumps once a day although I wonder about the compliance. 8/25; weeping area on the right medial lower leg. This had actually gotten a small localized area of her compression stocking wet. On the left side there is a large denuded area on the posterior medial lower leg and smaller area on the lateral. This was not the original areas that we dealt with. 9/1 the patient's wound on the left leg include the left lateral and left posterior. Larger superficial wounds weeping. She has very poor edema control. Tender localized edema in the left lower medial ankle/heel probably because of localized wrap issues. She freely admits she is not using [place] ([id number]) the compression pumps. She has been up on her feet a lot. She thinks the hydrofera blue is contributing to the pain she is experiencing.. This is a complaint that I have occasionally heard 9/8; really not much improvement. The patient is still complaining of a lot of pain particularly when she uses compression pumps. I switched her to silver alginate last time because she found the Hydrofera Blue to be irritating.  I don't hear much difference in her description with the silver alginate. 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 She has follow-up with Dr. Donzetta Matters I think for ultrasound next month 9/15; quite a bit of improvement today. We have less edema and more epithelialization in both of her wound areas on the left medial and left lateral calf. These are not the site of her original wounds in this area. She says she has been using her compression pumps for 30 minutes twice  a day, there pain issues that never quite understood. Silver alginate as the primary dressing 9/22; continued improvement. Both areas medially and laterally still have a small open area there is some eschar. She continues to complain of left medial ankle pain. Swelling in the leg is in much better condition. We have been using silver alginate 9/29; continued improvement. Both areas medially and laterally in the left calf look as though they are close some minor surface eschar but I think this is epithelialized. She comes in today saying she has a ruptured disc at L4-L5 cannot bend over to put on her stockings. 10/6; patient comes in today with no open wounds on either leg. However her edema on the left leg in the upper one third of the lower leg is poorly controlled nonpitting. She says that she could not use the pumps for 2 days and then she has been using the last couple of days. It is not clear to me she has been able to get her stocking on. She has back problems. Mrs. Pozo has severe chronic venous insufficiency with secondary lymphedema. Her venous insufficiency is partially centrally mediated and that she is now post stent in the left common iliac vein. [facility] Thurner's syndrome/physiology]. She follows up with them on 10/15. She wears 20/30 below-knee stockings. She is supposed to use compression pumps at home although I think her compliance about with this is been less than 100%. I have asked her to use these 3 times a day. Finally I think she has lipodermatosclerosis in the left lower leg with an inverted bottle sign. It is been a major problem controlling the edema in the left leg. The right leg we have had wounds on but not as significant a problem is on the left READMISSION 04/12/2021 Mrs. Zimbelman is a 75 year old woman we know well in this clinic. She has severe chronic venous insufficiency. 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 I believe she  has had bilateral greater saphenous vein ablation in the past as well. She tells me that this wound opened sometime in March. She had a fall and thinks it was initially abrasion. She developed areas she describes as little blisters on the anterior part of her leg and she saw dermatology and was treated for methicillin staph aureus with several rounds of antibiotics. She has been using support stockings on the left leg and says this is the only thing she can get on. Her compression pump use maybe once a day she says if she did not use one she use the other. She comes in today with incredible swelling in the left leg with a wound on the left posterior calf. She has been using Neosporin to this previously a hydrocolloid. 6/15; patient arrives back for 1 week follow-up.Marland Kitchen Apparently her wrap fell down she did not call us to replace this. He  has poor edema control. She only uses her compression pumps once a day 6/29; patient presents for 1 week follow-up. She has tolerated the compression wrap well. We have been using Iodoflex under the wrap. She has no issues or complaints today. 7/6; patient presents for 1 week follow-up. She states that the compression wrap rolled down her leg 4 days ago. She has been trying to keep the area covered but has no dressings at home to use. She denies signs of infection. 7/13; patient presents for 1 week follow-up. She states that her compression wrap rolled down her right leg and she called our office and had it placed a few days ago. She has been tolerating the current wrap well. She states that the Iodoflex is causing a burning sensation. She denies signs of infection. 7/27; patient presents for 1 week follow-up She has tolerated the compression wrap well with Hydrofera Blue underneath. She has now developed a wound to her right lower extremity. She reports having a culture done To this area by her dermatologist that she reports is negative. She currently denies signs of  infection. 8/30; patient presents for 1 week follow-up. On the left side she has tolerated the compression wrap well with Hydrofera Blue underneath. She reports some discomfort to her right lower extremity with the 3 layer compression. She currently denies signs of infection. 06/14/2021 upon evaluation today patient's wound actually appears to be doing decently well based on what I am seeing currently. She actually has 2 areas 1 on the left distal/posterior lower leg and the other on the right lower leg. Subsequently the measurements are roughly the same may be slightly smaller but in general have not made any trend towards getting worse which is great news. 06/23/2021 upon evaluation today patient appears to be doing pretty well in regard to her wounds. On the right she is having difficulty with sciatic pain and this subsequently has led to her taking the wrap off over the last week her leg is more swollen she is also been on prednisone for 10 days. With that being said I think we may want to just use a compression sock on the left that way she will not have to worry about the compression wrap being in place that she cannot get off. With that being said I do believe as well that the patient is going to need to continue with the wrapping on the left we will also need to do a little bit of sharp debridement today. 8/24; patient presents for 1 week follow-up. She has no issues or complaints today. She denies signs of infection. 8/31; patient presents for 1 week follow-up. She has no issues or complaints today. She has tolerated the compression wrap well on the left lower extremity. She denies signs of infection. Patient History Information obtained from Patient. Family History Cancer - Mother,Paternal Grandparents, Hypertension - Mother,Father, Kidney Disease - Paternal Grandparents, Lung Disease - Paternal Grandparents, No family history of Diabetes, Heart Disease, Hereditary Spherocytosis, Seizures,  Stroke, Thyroid Problems, Tuberculosis. Social History Former smoker - quit at age 75 - ended on 04/19/1966, Marital Status - Divorced, Alcohol Use - Never, Drug Use - No History, Caffeine Use - Daily. NAJJA, KAUL ([id number]) Medical History Eyes Patient has history of Cataracts Denies history of Glaucoma, Optic Neuritis Ear/Nose/Mouth/Throat Denies history of Chronic sinus problems/congestion, Middle ear problems Hematologic/Lymphatic Denies history of Anemia, Hemophilia, Human Immunodeficiency Virus, Lymphedema, Sickle Cell Disease Respiratory Patient has history of Asthma, Sleep Apnea Denies history  of Aspiration, Chronic Obstructive Pulmonary Disease (COPD), Pneumothorax, Tuberculosis Cardiovascular Patient has history of Deep Vein Thrombosis - LLE 2010, Hypertension, Peripheral Venous Disease Denies history of Angina, Arrhythmia, Congestive Heart Failure, Coronary Artery Disease, Hypotension, Myocardial Infarction, Peripheral Arterial Disease, Phlebitis, Vasculitis Gastrointestinal Denies history of Cirrhosis , Colitis, Crohn s, Hepatitis A, Hepatitis B, Hepatitis C Endocrine Denies history of Type I Diabetes, Type II Diabetes Genitourinary Denies history of End Stage Renal Disease Immunological Denies history of Lupus Erythematosus, Raynaud s, Scleroderma Integumentary (Skin) Denies history of History of Burn, History of pressure wounds Musculoskeletal Patient has history of Osteoarthritis - neck Denies history of Gout, Rheumatoid Arthritis, Osteomyelitis Neurologic Denies history of Dementia, Neuropathy, Quadriplegia, Paraplegia, Seizure Disorder Oncologic Patient has history of Received Chemotherapy - interfeon immunotherapy, Received Radiation Psychiatric Denies history of Anorexia/bulimia, Confinement Anxiety Hospitalization/Surgery History - Melanoma left shoulder. Medical And Surgical History Notes Constitutional Symptoms (General Health) Vein ablation LLE  2010 Vascular Sx ( vein ablationo) LLE 02/2015 Eyes Horners syndrome Genitourinary Kidney stones Neurologic ct scan showed swollen lymph nodes Oncologic Melanoma (R) shoulder 07/2010; (R) axillary lymphnode removal Objective Constitutional Vitals Time Taken: 1:22 PM, Height: 63 in, Weight: 212 lbs, BMI: 37.6, Temperature: 98.7 F, Pulse: 82 bpm, Respiratory Rate: 18 breaths/min, Blood Pressure: 143/86 mmHg. General Notes: Left lower extremity: To the distal medial aspect there is an open wound with granulation tissue and nonviable tissue present. No signs of infection. Right lower extremity: No open wounds Integumentary (Hair, Skin) Wound #13 status is Open. Original cause of wound was Gradually Appeared. The date acquired was: 02/24/2021. The wound has been in treatment 12 weeks. The wound is located on the Left,Distal,Posterior Lower Leg. The wound measures 1.6cm length x 1.4cm width x 0.2cm depth; 1.759cm^2 area and 0.352cm^3 volume. There is Fat Layer (Subcutaneous Tissue) exposed. There is no tunneling or undermining noted. There is a medium amount of serosanguineous drainage noted. There is medium (34-66%) red, pink granulation within the wound bed. There is a medium (34-66%) amount of necrotic tissue within the wound bed including Adherent Slough. ESPERANSA, LOUT ([id number]) Wound #14 status is Healed - Epithelialized. Original cause of wound was Gradually Appeared. The date acquired was: 05/05/2021. The wound has been in treatment 5 weeks. The wound is located on the Right Lower Leg. The wound measures 0cm length x 0cm width x 0cm depth; 0cm^2 area and 0cm^3 volume. There is no tunneling or undermining noted. There is a none present amount of drainage noted. There is no granulation within the wound bed. There is no necrotic tissue within the wound bed. Assessment Active Problems ICD-10 Chronic venous hypertension (idiopathic) with ulcer and inflammation of left lower  extremity Lymphedema, not elsewhere classified Non-pressure chronic ulcer of left calf limited to breakdown of skin Unspecified open wound, right lower leg, initial encounter Patient's left lower extremity wound has shown improvement in size since last clinic visit albeit small. I debrided nonviable tissue. Post debridement there was good granulation tissue present. I recommended continuing collagen. At this time I would like to try a skin substitute. She has been approved for several options. She states that she had two different skin substitutes in the past that she did not do well with. We will look into this and if PuraPly is not one of them we will order this. There are no open wounds to the right lower extremity and I recommended using daily compression stocking to this leg. Procedures Wound #13 Pre-procedure diagnosis of Wound #13 is a Venous  Leg Ulcer located on the Left,Distal,Posterior Lower Leg .Severity of Tissue Pre Debridement is: Fat layer exposed. There was a Excisional Skin/Subcutaneous Tissue Debridement with a total area of 2.24 sq cm performed by Kalman Shan, MD. With the following instrument(s): Curette to remove Viable and Non-Viable tissue/material. Material removed includes Subcutaneous Tissue, Slough, and Biofilm after achieving pain control using Lidocaine. A time out was conducted at 14:03, prior to the start of the procedure. A Minimum amount of bleeding was controlled with Pressure. The procedure was tolerated well. Post Debridement Measurements: 1.6cm length x 1.4cm width x 0.2cm depth; 0.352cm^3 volume. Character of Wound/Ulcer Post Debridement is improved. Severity of Tissue Post Debridement is: Fat layer exposed. Post procedure Diagnosis Wound #13: Same as Pre-Procedure Plan Follow-up Appointments: Wound #13 Left,Distal,Posterior Lower Leg: Return Appointment in 1 week. Nurse Visit as needed Bathing/ Shower/ Hygiene: May shower with wound dressing  protected with water repellent cover or cast protector. Anesthetic (Use 'Patient Medications' Section for Anesthetic Order Entry): Lidocaine applied to wound bed Edema Control - Lymphedema / Segmental Compressive Device / Other: Patient to wear own compression stockings. Remove compression stockings every night before going to bed and put on every morning when getting up. - right leg Elevate, Exercise Daily and Avoid Standing for Long Periods of Time. Elevate legs to the level of the heart and pump ankles as often as possible Elevate leg(s) parallel to the floor when sitting. Compression Pump: Use compression pump on left lower extremity for 60 minutes, twice daily. - TWICE DAILY for 1 hour Compression Pump: Use compression pump on right lower extremity for 60 minutes, twice daily. - TWICE DAILY for 1 hour WOUND #13: - Lower Leg Wound Laterality: Left, Posterior, Distal Cleanser: Soap and Water 1 x Per Week/30 Days Discharge Instructions: Gently cleanse wound with antibacterial soap, rinse and pat dry prior to dressing wounds Topical: Triamcinolone Acetonide Cream, 0.1%, 15 (g) tube 1 x Per Week/30 Days Discharge Instructions: Peri wound Apply as directed by provider. Primary Dressing: Prisma 4.34 (in) 1 x Per Week/30 Days Discharge Instructions: Moisten w/normal saline or sterile water; Cover wound as directed. Do not remove from wound bed. Secondary Dressing: ABD Pad 5x9 (in/in) 1 x Per Week/30 Days Discharge Instructions: Cover with ABD pad EMONNIE, BETZOLD ([id number]) Compression Wrap: Medichoice 4 layer Compression System, 35-40 mmHG 1 x Per Week/30 Days Discharge Instructions: Apply multi-layer wrap as directed. WOUND #14: - Lower Leg Wound Laterality: Right Cleanser: Soap and Water 1 x Per Week/30 Days Discharge Instructions: Gently cleanse wound with antibacterial soap, rinse and pat dry prior to dressing wounds Topical: Triamcinolone Acetonide Cream, 0.1%, 15 (g) tube 1 x Per  Week/30 Days Discharge Instructions: Peri wound Apply as directed by provider. Secondary Dressing: Non-Adherent Pad 3x8 (in/in) 1 x Per Week/30 Days Compression Wrap: coban 1 x Per Week/30 Days 1. Collagen under compression to the left lower extremity 2. Daily compression stocking to the right lower extremity 3. Follow-up in 1 week Electronic Signature(s) Signed: 07/05/2021 2:30:18 PM By: Kalman Shan DO Entered By: Kalman Shan on 07/05/2021 14:29:45 Lyter, [name] -------------------------------------------------------------------------------- ROS/PFSH Details Patient Name: JHOVANA, NELL. Date of Service: 07/05/2021 1:15 PM Medical Record Number: [id number] Patient Account Number: [id number] Date of Birth/Sex: [birth year] (75 y.o. F) Treating RN: Donnamarie Poag Primary Care Provider: [name] Other Clinician: Referring Provider: [name] Treating Provider/Extender: Yaakov Guthrie in Treatment: 12 Label Progress Note Print Version as History and Physical for this encounter Information Obtained From Patient Constitutional  Symptoms (General Health) Medical History: Past Medical History Notes: Vein ablation LLE 2010 Vascular Sx ( vein ablationo) LLE 02/2015 Eyes Medical History: Positive for: Cataracts Negative for: Glaucoma; Optic Neuritis Past Medical History Notes: Horners syndrome Ear/Nose/Mouth/Throat Medical History: Negative for: Chronic sinus problems/congestion; Middle ear problems Hematologic/Lymphatic Medical History: Negative for: Anemia; Hemophilia; Human Immunodeficiency Virus; Lymphedema; Sickle Cell Disease Respiratory Medical History: Positive for: Asthma; Sleep Apnea Negative for: Aspiration; Chronic Obstructive Pulmonary Disease (COPD); Pneumothorax; Tuberculosis Cardiovascular Medical History: Positive for: Deep Vein Thrombosis - LLE 2010; Hypertension; Peripheral Venous Disease Negative for: Angina;  Arrhythmia; Congestive Heart Failure; Coronary Artery Disease; Hypotension; Myocardial Infarction; Peripheral Arterial Disease; Phlebitis; Vasculitis Gastrointestinal Medical History: Negative for: Cirrhosis ; Colitis; Crohnos; Hepatitis A; Hepatitis B; Hepatitis C Endocrine Medical History: Negative for: Type I Diabetes; Type II Diabetes Genitourinary Medical History: Negative for: End Stage Renal Disease Past Medical History Notes: Kidney stones MARIAMA, EDNIE ([id number]) Immunological Medical History: Negative for: Lupus Erythematosus; Raynaudos; Scleroderma Integumentary (Skin) Medical History: Negative for: History of Burn; History of pressure wounds Musculoskeletal Medical History: Positive for: Osteoarthritis - neck Negative for: Gout; Rheumatoid Arthritis; Osteomyelitis Neurologic Medical History: Negative for: Dementia; Neuropathy; Quadriplegia; Paraplegia; Seizure Disorder Past Medical History Notes: ct scan showed swollen lymph nodes Oncologic Medical History: Positive for: Received Chemotherapy - interfeon immunotherapy; Received Radiation Past Medical History Notes: Melanoma (R) shoulder 07/2010; (R) axillary lymphnode removal Psychiatric Medical History: Negative for: Anorexia/bulimia; Confinement Anxiety HBO Extended History Items Eyes: Cataracts Immunizations Pneumococcal Vaccine: Received Pneumococcal Vaccination: Yes Received Pneumococcal Vaccination On or After 60th Birthday: No Immunization Notes: tetanus shot w/in the last 5 years per pt Implantable Devices None Hospitalization / Surgery History Type of Hospitalization/Surgery Melanoma left shoulder Family and Social History Cancer: Yes - Mother,Paternal Grandparents; Diabetes: No; Heart Disease: No; Hereditary Spherocytosis: No; Hypertension: Yes - Mother,Father; Kidney Disease: Yes - Paternal Grandparents; Lung Disease: Yes - Paternal Grandparents; Seizures: No; Stroke: No;  Thyroid Problems: No; Tuberculosis: No; Former smoker - quit at age 64 - ended on 04/19/1966; Marital Status - Divorced; Alcohol Use: Never; Drug Use: No History; Caffeine Use: Daily; Financial Concerns: No; Food, Clothing or Shelter Needs: No; Support System Lacking: No; Transportation Concerns: No Electronic Signature(s) Signed: 07/05/2021 2:30:18 PM By: Kalman Shan DO Signed: 07/05/2021 3:14:25 PM By: Donnamarie Poag Entered By: Kalman Shan on 07/05/2021 14:26:14 Kitko, [name] -------------------------------------------------------------------------------- SuperBill Details Patient Name: TEMARI, WHINNERY. Date of Service: 07/05/2021 Medical Record Number: [id number] Patient Account Number: [id number] Date of Birth/Sex: 12/30/1945 (75 y.o. F) Treating RN: Donnamarie Poag Primary Care Provider: [name] Other Clinician: Referring Provider: [name] Treating Provider/Extender: Yaakov Guthrie in Treatment: 12 Diagnosis Coding ICD-10 Codes Code Description [phone] Chronic venous hypertension (idiopathic) with ulcer and inflammation of left lower extremity I89.0 Lymphedema, not elsewhere classified L97.221 Non-pressure chronic ulcer of left calf limited to breakdown of skin Facility Procedures CPT4 Code: [id number] Description: [id number] - DEB SUBQ TISSUE 20 SQ CM/< Modifier: Quantity: 1 CPT4 Code: Description: ICD-10 Diagnosis Description L97.221 Non-pressure chronic ulcer of left calf limited to breakdown of skin Modifier: Quantity: Physician Procedures CPT4 Code: [id number] Description: 11042 - WC PHYS SUBQ TISS 20 SQ CM Modifier: Quantity: 1 CPT4 Code: Description: ICD-10 Diagnosis Description L97.221 Non-pressure chronic ulcer of left calf limited to breakdown of skin Modifier: Quantity: Electronic Signature(s) Signed: 07/05/2021 2:30:18 PM By: Kalman Shan DO Entered By: Kalman Shan on 07/05/2021 14:29:53

## 2021-07-05 NOTE — Progress Notes (Signed)
ALANTIS, SEIFERT ([id number]) Visit Report for 07/05/2021 Arrival Information Details Patient Name: [name], [name]. Date of Service: 07/05/2021 1:15 PM Medical Record Number: [id number] Patient Account Number: [id number] Date of Birth/Sex: Mar 08, 1946 (75 y.o. F) Treating RN: Donnamarie Poag Primary Care Caelum Federici: [name] Other Clinician: Referring Kalysta Kneisley: [name] Treating Karessa Onorato/Extender: Yaakov Guthrie in Treatment: 12 Visit Information History Since Last Visit Added or deleted any medications: No Patient Arrived: Ambulatory Had a fall or experienced change in No Arrival Time: 13:20 activities of daily living that may affect Accompanied By: self risk of falls: Transfer Assistance: None Hospitalized since last visit: No Patient Identification Verified: Yes Has Dressing in Place as Prescribed: Yes Secondary Verification Process Completed: Yes Has Compression in Place as Prescribed: Yes Patient Requires Transmission-Based No Pain Present Now: Yes Precautions: Patient Has Alerts: Yes Patient Alerts: Patient on Blood Thinner ASPIRIN NOT diabetic Electronic Signature(s) Signed: 07/05/2021 3:14:25 PM By: Donnamarie Poag Entered By: Donnamarie Poag on 07/05/2021 13:23:17 Stacy, [name] -------------------------------------------------------------------------------- Clinic Level of Care Assessment Details Patient Name: [name], [name]. Date of Service: 07/05/2021 1:15 PM Medical Record Number: [id number] Patient Account Number: [id number] Date of Birth/Sex: October 02, 1946 (75 y.o. F) Treating RN: Donnamarie Poag Primary Care Ibraham Levi: [name] Other Clinician: Referring Elenie Coven: [name] Treating Narada Uzzle/Extender: Yaakov Guthrie in Treatment: 12 Clinic Level of Care Assessment Items TOOL 1 Quantity Score '[]'$  - Use when EandM and Procedure is performed on INITIAL visit 0 ASSESSMENTS - Nursing Assessment / Reassessment '[]'$  - General  Physical Exam (combine w/ comprehensive assessment (listed just below) when performed on new 0 pt. evals) '[]'$  - 0 Comprehensive Assessment (HX, ROS, Risk Assessments, Wounds Hx, etc.) ASSESSMENTS - Wound and Skin Assessment / Reassessment '[]'$  - Dermatologic / Skin Assessment (not related to wound area) 0 ASSESSMENTS - Ostomy and/or Continence Assessment and Care '[]'$  - Incontinence Assessment and Management 0 '[]'$  - 0 Ostomy Care Assessment and Management (repouching, etc.) PROCESS - Coordination of Care '[]'$  - Simple Patient / Family Education for ongoing care 0 '[]'$  - 0 Complex (extensive) Patient / Family Education for ongoing care '[]'$  - 0 Staff obtains Consents, Records, Test Results / Process Orders '[]'$  - 0 Staff telephones HHA, Nursing Homes / Clarify orders / etc '[]'$  - 0 Routine Transfer to another Facility (non-emergent condition) '[]'$  - 0 Routine Hospital Admission (non-emergent condition) '[]'$  - 0 New Admissions / Biomedical engineer / Ordering NPWT, Apligraf, etc. '[]'$  - 0 Emergency Hospital Admission (emergent condition) PROCESS - Special Needs '[]'$  - Pediatric / Minor Patient Management 0 '[]'$  - 0 Isolation Patient Management '[]'$  - 0 Hearing / Language / Visual special needs '[]'$  - 0 Assessment of Community assistance (transportation, D/C planning, etc.) '[]'$  - 0 Additional assistance / Altered mentation '[]'$  - 0 Support Surface(s) Assessment (bed, cushion, seat, etc.) INTERVENTIONS - Miscellaneous '[]'$  - External ear exam 0 '[]'$  - 0 Patient Transfer (multiple staff / Civil Service fast streamer / Similar devices) '[]'$  - 0 Simple Staple / Suture removal (25 or less) '[]'$  - 0 Complex Staple / Suture removal (26 or more) '[]'$  - 0 Hypo/Hyperglycemic Management (do not check if billed separately) '[]'$  - 0 Ankle / Brachial Index (ABI) - do not check if billed separately Has the patient been seen at the hospital within the last three years: Yes Total Score: 0 Level Of Care: ____ [name]  ([id number]) Electronic Signature(s) Signed: 07/05/2021 3:14:25 PM By: Donnamarie Poag Entered By: Donnamarie Poag on 07/05/2021 14:07:55 Ebert, Laquanna J. ([id number]) --------------------------------------------------------------------------------  Encounter Discharge Information Details Patient Name: [name], [name]. Date of Service: 07/05/2021 1:15 PM Medical Record Number: [id number] Patient Account Number: [id number] Date of Birth/Sex: 01-19-1946 (75 y.o. F) Treating RN: Donnamarie Poag Primary Care Ashlee Bewley: [name] Other Clinician: Referring Stacy Sailer: [name] Treating Lukka Black/Extender: Yaakov Guthrie in Treatment: 12 Encounter Discharge Information Items Post Procedure Vitals Discharge Condition: Stable Temperature (F): 98.7 Ambulatory Status: Ambulatory Pulse (bpm): 82 Discharge Destination: Home Respiratory Rate (breaths/min): 18 Transportation: Private Auto Blood Pressure (mmHg): 143/86 Accompanied By: self Schedule Follow-up Appointment: Yes Clinical Summary of Care: Electronic Signature(s) Signed: 07/05/2021 3:14:25 PM By: Donnamarie Poag Entered By: Donnamarie Poag on 07/05/2021 14:22:14 Hendrie, [name] ([id number]) -------------------------------------------------------------------------------- Lower Extremity Assessment Details Patient Name: [name], [name]. Date of Service: 07/05/2021 1:15 PM Medical Record Number: [id number] Patient Account Number: [id number] Date of Birth/Sex: [birth year] (75 y.o. F) Treating RN: Donnamarie Poag Primary Care Capers Hagmann: [name] Other Clinician: Referring Pasqual Farias: [name] Treating Kiyani Jernigan/Extender: Yaakov Guthrie in Treatment: 12 Edema Assessment Assessed: [name]: Yes] Patrice Paradise: Yes] [Left: Edema] [Right: :] Calf Left: Right: Point of Measurement: 32 cm From Medial Instep 37.5 cm 43 cm Ankle Left: Right: Point of Measurement: 10 cm From Medial Instep 22 cm 22.4 cm Knee To Floor Left: Right: From  Medial Instep 42 cm 42 cm Vascular Assessment Pulses: Dorsalis Pedis Palpable: [Left:Yes] [Right:Yes] Electronic Signature(s) Signed: 07/05/2021 3:14:25 PM By: Donnamarie Poag Entered By: Donnamarie Poag on 07/05/2021 13:35:20 Halberg, [name] ([id number]) -------------------------------------------------------------------------------- Multi Wound Chart Details Patient Name: [name], [name]. Date of Service: 07/05/2021 1:15 PM Medical Record Number: [id number] Patient Account Number: [id number] Date of Birth/Sex: [birth year] (75 y.o. F) Treating RN: Donnamarie Poag Primary Care Farren Nelles: [name] Other Clinician: Referring Thunder Bridge[place]: [name] Treating Dalene Robards/Extender: Yaakov Guthrie in Treatment: 12 Vital Signs Height(in): 56 Pulse(bpm): 96 Weight(lbs): 212 Blood Pressure(mmHg): 143/86 Body Mass Index(BMI): 38 Temperature(F): 98.7 Respiratory Rate(breaths/min): 18 Photos: [N/A:N/A] Wound Location: Left, Distal, Posterior Lower Leg Right Lower Leg N/A Wounding Event: Gradually Appeared Gradually Appeared N/A Primary Etiology: Venous Leg Ulcer Venous Leg Ulcer N/A Comorbid History: Cataracts, Asthma, Sleep Apnea, Cataracts, Asthma, Sleep Apnea, N/A Deep Vein Thrombosis, Deep Vein Thrombosis, Hypertension, Peripheral Venous Hypertension, Peripheral Venous Disease, Osteoarthritis, Received Disease, Osteoarthritis, Received Chemotherapy, Received Radiation Chemotherapy, Received Radiation Date Acquired: 02/24/2021 05/05/2021 N/A Weeks of Treatment: 12 5 N/A Wound Status: Open Healed - Epithelialized N/A Measurements L x W x D (cm) 1.6x1.4x0.2 0x0x0 N/A Area (cm) : 1.759 0 N/A Volume (cm) : 0.352 0 N/A % Reduction in Area: 70.10% 100.00% N/A % Reduction in Volume: 40.20% 100.00% N/A Classification: Full Thickness Without Exposed Partial Thickness N/A Support Structures Exudate Amount: Medium None Present N/A Exudate Type: Serosanguineous N/A N/A Exudate Color: red,  brown N/A N/A Granulation Amount: Medium (34-66%) None Present (0%) N/A Granulation Quality: Red, Pink N/A N/A Necrotic Amount: Medium (34-66%) None Present (0%) N/A Exposed Structures: Fat Layer (Subcutaneous Tissue): Fascia: No N/A Yes Fat Layer (Subcutaneous Tissue): Fascia: No No Tendon: No Tendon: No Muscle: No Muscle: No Joint: No Joint: No Bone: No Bone: No Epithelialization: Medium (34-66%) Large (67-100%) N/A Debridement: Debridement - Excisional N/A N/A Pre-procedure Verification/Time 14:03 N/A N/A Out Taken: Pain Control: Lidocaine N/A N/A Tissue Debrided: Subcutaneous, Slough N/A N/A Level: Skin/Subcutaneous Tissue N/A N/A Debridement Area (sq cm): 2.24 N/A N/A Instrument: Curette N/A N/A Bleeding: Minimum N/A N/A Hemostasis Achieved: Pressure N/A N/A Procedure was tolerated well N/A N/A [name], [name] ([id number]) Debridement Treatment Response: Post Debridement 1.6x1.4x0.2 N/A  N/A Measurements L x W x D (cm) Post Debridement Volume: 0.352 N/A N/A (cm) Procedures Performed: Debridement N/A N/A Treatment Notes Wound #13 (Lower Leg) Wound Laterality: Left, Posterior, Distal Cleanser Soap and Water Discharge Instruction: Gently cleanse wound with antibacterial soap, rinse and pat dry prior to dressing wounds Peri-Wound Care Topical Triamcinolone Acetonide Cream, 0.1%, 15 (g) tube Discharge Instruction: Peri wound Apply as directed by Sharnika Binney. Primary Dressing Prisma 4.34 (in) Discharge Instruction: Moisten w/normal saline or sterile water; Cover wound as directed. Do not remove from wound bed. Secondary Dressing ABD Pad 5x9 (in/in) Discharge Instruction: Cover with ABD pad Secured With Compression Wrap Medichoice 4 layer Compression System, 35-40 mmHG Discharge Instruction: Apply multi-layer wrap as directed. Compression Stockings Add-Ons Electronic Signature(s) Signed: 07/05/2021 2:30:18 PM By: Kalman Shan DO Entered By: Kalman Shan  on 07/05/2021 14:25:05 Schnoebelen, [name] -------------------------------------------------------------------------------- Multi-Disciplinary Care Plan Details Patient Name: [name], [name]. Date of Service: 07/05/2021 1:15 PM Medical Record Number: [id number] Patient Account Number: [id number] Date of Birth/Sex: [birth year] (75 y.o. F) Treating RN: Donnamarie Poag Primary Care Zyairah Wacha: [name] Other Clinician: Referring Mickey Esguerra: [name] Treating Ayeshia Coppin/Extender: Yaakov Guthrie in Treatment: 12 Active Inactive Venous Leg Ulcer Nursing Diagnoses: Actual venous Insuffiency (use after diagnosis is confirmed) Knowledge deficit related to disease process and management Potential for venous Insuffiency (use before diagnosis confirmed) Goals: Patient will maintain optimal edema control Date Initiated: 04/12/2021 Target Resolution Date: 08/26/2021 Goal Status: Active Interventions: Assess peripheral edema status every visit. Compression as ordered Provide education on venous insufficiency Treatment Activities: Therapeutic compression applied : 04/12/2021 Notes: Electronic Signature(s) Signed: 07/05/2021 3:14:25 PM By: Donnamarie Poag Entered By: Donnamarie Poag on 07/05/2021 13:35:36 [place], Lakeisha Lenna Sciara ([id number]) -------------------------------------------------------------------------------- Pain Assessment Details Patient Name: [name], [name]. Date of Service: 07/05/2021 1:15 PM Medical Record Number: [id number] Patient Account Number: [id number] Date of Birth/Sex: February 22, 1946 (75 y.o. F) Treating RN: Donnamarie Poag Primary Care Fenton Candee: [name] Other Clinician: Referring Phoenyx Melka: [name] Treating Reighn Kaplan/Extender: Yaakov Guthrie in Treatment: 12 Active Problems Location of Pain Severity and Description of Pain Patient Has Paino No Site Locations Rate the pain. Current Pain Level: 0 Pain Management and Medication Current Pain  Management: Electronic Signature(s) Signed: 07/05/2021 3:14:25 PM By: Donnamarie Poag Entered By: Donnamarie Poag on 07/05/2021 13:23:45 Bondy, [name] -------------------------------------------------------------------------------- Patient/Caregiver Education Details Patient Name: [name], [name]. Date of Service: 07/05/2021 1:15 PM Medical Record Number: [id number] Patient Account Number: [id number] Date of Birth/Gender: 1946/06/26 (75 y.o. F) Treating RN: Donnamarie Poag Primary Care Physician: [name] Other Clinician: Referring Physician: [name] Treating Physician/Extender: Yaakov Guthrie in Treatment: 12 Education Assessment Education Provided To: Patient Education Topics Provided Basic Hygiene: Venous: Wound/Skin Impairment: Electronic Signature(s) Signed: 07/05/2021 3:14:25 PM By: Donnamarie Poag Entered By: Donnamarie Poag on 07/05/2021 13:59:47 [name], [name] -------------------------------------------------------------------------------- Wound Assessment Details Patient Name: [name], [name]. Date of Service: 07/05/2021 1:15 PM Medical Record Number: [id number] Patient Account Number: [id number] Date of Birth/Sex: 13-Nov-1945 (75 y.o. F) Treating RN: Donnamarie Poag Primary Care Nancylee Gaines: [name] Other Clinician: Referring Zerick Prevette: [name] Treating Melena Hayes/Extender: Yaakov Guthrie in Treatment: 12 Wound Status Wound Number: 13 Primary Venous Leg Ulcer Etiology: Wound Location: Left, Distal, Posterior Lower Leg Wound Open Wounding Event: Gradually Appeared Status: Date Acquired: 02/24/2021 Comorbid Cataracts, Asthma, Sleep Apnea, Deep Vein Thrombosis, Weeks Of Treatment: 12 History: Hypertension, Peripheral Venous Disease, Osteoarthritis, Clustered Wound: No Received Chemotherapy, Received Radiation Photos Wound Measurements Length: (cm) 1.6 Width: (cm) 1.4 Depth: (cm)  0.2 Area: (cm) 1.759 Volume:  (cm) 0.352 % Reduction in Area: 70.1% % Reduction in Volume: 40.2% Epithelialization: Medium (34-66%) Tunneling: No Undermining: No Wound Description Classification: Full Thickness Without Exposed Support Structures Exudate Amount: Medium Exudate Type: Serosanguineous Exudate Color: red, brown Foul Odor After Cleansing: No Slough/Fibrino Yes Wound Bed Granulation Amount: Medium (34-66%) Exposed Structure Granulation Quality: Red, Pink Fascia Exposed: No Necrotic Amount: Medium (34-66%) Fat Layer (Subcutaneous Tissue) Exposed: Yes Necrotic Quality: Adherent Slough Tendon Exposed: No Muscle Exposed: No Joint Exposed: No Bone Exposed: No Treatment Notes Wound #13 (Lower Leg) Wound Laterality: Left, Posterior, Distal Cleanser Soap and Water Discharge Instruction: Gently cleanse wound with antibacterial soap, rinse and pat dry prior to dressing wounds Peri-Wound Care [name], [name] Topical Triamcinolone Acetonide Cream, 0.1%, 15 (g) tube Discharge Instruction: Peri wound Apply as directed by Lajuanna Pompa. Primary Dressing Prisma 4.34 (in) Discharge Instruction: Moisten w/normal saline or sterile water; Cover wound as directed. Do not remove from wound bed. Secondary Dressing ABD Pad 5x9 (in/in) Discharge Instruction: Cover with ABD pad Secured With Compression Wrap Medichoice 4 layer Compression System, 35-40 mmHG Discharge Instruction: Apply multi-layer wrap as directed. Compression Stockings Add-Ons Electronic Signature(s) Signed: 07/05/2021 3:14:25 PM By: Donnamarie Poag Entered By: Donnamarie Poag on 07/05/2021 13:32:00 [name], [name] -------------------------------------------------------------------------------- Wound Assessment Details Patient Name: NAHJA, TRUGLIO. Date of Service: 07/05/2021 1:15 PM Medical Record Number: [id number] Patient Account Number: [id number] Date of Birth/Sex: [birth year] (75 y.o. F) Treating RN: Donnamarie Poag Primary  Care Johara Lodwick: [name] Other Clinician: Referring Sabree Nuon: [name] Treating Prathik Aman/Extender: Yaakov Guthrie in Treatment: 12 Wound Status Wound Number: 14 Primary Venous Leg Ulcer Etiology: Wound Location: Right Lower Leg Wound Healed - Epithelialized Wounding Event: Gradually Appeared Status: Date Acquired: 05/05/2021 Comorbid Cataracts, Asthma, Sleep Apnea, Deep Vein Thrombosis, Weeks Of Treatment: 5 History: Hypertension, Peripheral Venous Disease, Osteoarthritis, Clustered Wound: No Received Chemotherapy, Received Radiation Photos Wound Measurements Length: (cm) 0 Width: (cm) 0 Depth: (cm) 0 Area: (cm) 0 Volume: (cm) 0 % Reduction in Area: 100% % Reduction in Volume: 100% Epithelialization: Large (67-100%) Tunneling: No Undermining: No Wound Description Classification: Partial Thickness Exudate Amount: None Present Foul Odor After Cleansing: No Slough/Fibrino No Wound Bed Granulation Amount: None Present (0%) Exposed Structure Necrotic Amount: None Present (0%) Fascia Exposed: No Fat Layer (Subcutaneous Tissue) Exposed: No Tendon Exposed: No Muscle Exposed: No Joint Exposed: No Bone Exposed: No Electronic Signature(s) Signed: 07/05/2021 3:14:25 PM By: Donnamarie Poag Entered By: Donnamarie Poag on 07/05/2021 14:07:21 Muramoto, [name] -------------------------------------------------------------------------------- Vitals Details Patient Name: SEANA, STELMA. Date of Service: 07/05/2021 1:15 PM Medical Record Number: [id number] Patient Account Number: [id number] Date of Birth/Sex: July 25, 1946 (75 y.o. F) Treating RN: Donnamarie Poag Primary Care Bode Pieper: [name] Other Clinician: Referring Ayuub Penley: [name] Treating Prim Morace/Extender: Yaakov Guthrie in Treatment: 12 Vital Signs Time Taken: 13:22 Temperature (F): 98.7 Height (in): 63 Pulse (bpm): 82 Weight (lbs): 212 Respiratory Rate  (breaths/min): 18 Body Mass Index (BMI): 37.6 Blood Pressure (mmHg): 143/86 Reference Range: 80 - 120 mg / dl Electronic Signature(s) Signed: 07/05/2021 3:14:25 PM By: Donnamarie Poag Entered ByDonnamarie Poag on 07/05/2021 13:23:36

## 2021-07-05 NOTE — Assessment & Plan Note (Signed)
Sciatica symptoms are improving however now notes R lower back pain with radiation to upper buttock ?near SIJ.  Toradol IM has helped in the past.  Discussed current regimen which includes oxycodone PRN, as well as will Rx robaxin muscle relaxant to use as needed - discussed sedation precautions and not to mix with oxycodone.  If ongoing over next 1-2 wks, discussed further evaluation with imaging (start with lumbar spine films) - esp given h/o cancer.  Pt agrees with plan.

## 2021-07-05 NOTE — Progress Notes (Signed)
Patient ID: [name], female    DOB: 1946-05-27, 75 y.o.   [name]  This visit was conducted in person.  BP 120/70   Pulse 90   Temp (!) 97.4 F (36.3 C) (Temporal)   Ht '5\' 3"'$  (1.6 m)   Wt 202 lb (91.6 kg)   SpO2 95%   BMI 35.78 kg/m    CC: low back pain  Subjective:   HPI: [name] is a 75 y.o. female presenting on 07/05/2021 for Back Pain (Here for low back pain f/u.  Started oxycodone last night- not sure if helpful yet. )   See prior notes for details.  Ongoing R sciatica pain s/p ER evaluation 06/05/2021 treated with toradol IM, discharged with prednisone and oxycodone but then had difficulty tolerating prednisone. She also continues tylenol '1000mg'$  bid. Symptoms did improve for 1+ week after ER eval but recurred mid August so she restarted tramadol TID. Notes worse pain with prolonged standing. Feels toradol shot helped the most.   Denies leg numbness or weakness, saddle anesthesia, fevers/chills. No recent falls.   On Friday of last week, she noticed R leg symptoms did improve with persistent tightness/spasm of R lower back to buttock.  Yesterday started oxycodone in place of tramadol.   Known lumbar DDD with moderate spinal stenosis L3/4 Prior to this month, last sciatica flare was 12/2020.   Has seen neurosurgery ([facility]) - both agreed surgery not indicated - not bad enough.  Caution with PT given cervical neck history.  Notes decrease in appetite associated with weight loss. She will call to schedule f/u with oncology.  Poor support network at home.    Lumbar MRI 06/2020 IMPRESSION: 1. Disc degeneration at L2-3 and below without progression from 2019. 2. L3-4 moderate spinal stenosis. 3. L4-5 and L5-S1 asymmetric right subarticular recess narrowing     Relevant past medical, surgical, family and social history reviewed and updated as indicated. Interim medical history since our last visit reviewed. Allergies and medications reviewed and  updated. Outpatient Medications Prior to Visit  Medication Sig Dispense Refill   albuterol (VENTOLIN HFA) 108 (90 Base) MCG/ACT inhaler INHALE 2 PUFFS BY MOUTH EVERY 6 HORUS AS NEEDED FOR WHEEZING/SHORTNESS OF BREATH 8.5 g 3   aspirin EC 81 MG tablet Take 81 mg by mouth at bedtime. Melanoma prevention     furosemide (LASIX) 40 MG tablet TAKE 1 TABLET (40 MG TOTAL) BY MOUTH 2 (TWO) TIMES DAILY AS NEEDED FOR FLUID. TAKE SECOND DOSE IF NEEDED FOR LEG SWELLING 60 tablet 2   KLOR-CON M20 20 MEQ tablet TAKE 1 TABLET BY MOUTH TWICE A DAY 60 tablet 5   loratadine (CLARITIN) 10 MG tablet Take 10 mg by mouth daily.     losartan (COZAAR) 25 MG tablet TAKE 2 TABLETS BY MOUTH EVERY DAY 60 tablet 2   meloxicam (MOBIC) 15 MG tablet TAKE 1 TABLET BY MOUTH EVERY DAY AS NEEDED FOR PAIN 30 tablet 1   montelukast (SINGULAIR) 10 MG tablet TAKE 1 TABLET BY MOUTH EVERY DAY 30 tablet 3   Multiple Vitamin (MULTIVITAMIN WITH MINERALS) TABS tablet Take 1 tablet by mouth daily.     oxyCODONE (ROXICODONE) 5 MG immediate release tablet Take 1 tablet (5 mg total) by mouth 3 (three) times daily as needed for severe pain. 15 tablet 0   Polyvinyl Alcohol-Povidone (TEARS PLUS OP) Place 1 drop into both eyes daily as needed (dry eyes/ redness/ burning).      PRESCRIPTION MEDICATION CPAP  Probiotic Product (PROBIOTIC DAILY PO) Take 1 tablet by mouth daily.      triamcinolone (NASACORT) 55 MCG/ACT AERO nasal inhaler Place 2 sprays into the nose daily. In each nostril     triamcinolone cream (KENALOG) 0.1 % Apply 1 application topically 2 (two) times daily. Apply to AA. Max 2 weeks at a time. 80 g 1   UNABLE TO FIND Take by mouth.     Vitamin D, Ergocalciferol, (DRISDOL) 1.25 MG (50000 UNIT) CAPS capsule TAKE 1 CAPSULE BY MOUTH EVERY 7 DAYS 12 capsule 3   acetaminophen (TYLENOL) 650 MG CR tablet Take 1,300 mg by mouth every 8 (eight) hours as needed for pain.      No facility-administered medications prior to visit.     Per  HPI unless specifically indicated in ROS section below Review of Systems  Objective:  BP 120/70   Pulse 90   Temp (!) 97.4 F (36.3 C) (Temporal)   Ht '5\' 3"'$  (1.6 m)   Wt 202 lb (91.6 kg)   SpO2 95%   BMI 35.78 kg/m   Wt Readings from Last 3 Encounters:  07/05/21 202 lb (91.6 kg)  06/23/21 205 lb 4 oz (93.1 kg)  06/09/21 214 lb 3 oz (97.2 kg)      Physical Exam Vitals and nursing note reviewed.  Constitutional:      Appearance: Normal appearance. She is not ill-appearing.  Musculoskeletal:        General: No swelling.     Right lower leg: Edema present.     Left lower leg: Edema present.     Comments:  Compression wraps in place, chronic leg swelling present  No pain midline spine  + left lumbar paraspinous mm discomfort  Negative seated SLR bilaterally  No pain with int/ext rotation at hip  Discomfort to palpation of L SIJ  Skin:    General: Skin is warm and dry.     Findings: No rash.  Neurological:     Mental Status: She is alert.      Results for orders placed or performed in visit on [id number]  Basic metabolic panel  Result Value Ref Range   Sodium 143 135 - 145 mEq/L   Potassium 4.1 3.5 - 5.1 mEq/L   Chloride 107 96 - 112 mEq/L   CO2 31 19 - 32 mEq/L   Glucose, Bld 96 70 - 99 mg/dL   BUN 15 6 - 23 mg/dL   Creatinine, Ser 0.67 0.40 - 1.20 mg/dL   GFR 86.03 >60.00 mL/min   Calcium 9.4 8.4 - 10.5 mg/dL  TSH  Result Value Ref Range   TSH 2.69 0.35 - 4.50 uIU/mL   *Note: Due to a large number of results and/or encounters for the requested time period, some results have not been displayed. A complete set of results can be found in Results Review.    Assessment & Plan:  This visit occurred during the SARS-CoV-2 public health emergency.  Safety protocols were in place, including screening questions prior to the visit, additional usage of staff PPE, and extensive cleaning of exam room while observing appropriate contact time as indicated for disinfecting  solutions.   Problem List Items Addressed This Visit     Right sided sciatica - Primary    Sciatica symptoms are improving however now notes R lower back pain with radiation to upper buttock ?near SIJ.  Toradol IM has helped in the past.  Discussed current regimen which includes oxycodone PRN, as well as will  Rx robaxin muscle relaxant to use as needed - discussed sedation precautions and not to mix with oxycodone.  If ongoing over next 1-2 wks, discussed further evaluation with imaging (start with lumbar spine films) - esp given h/o cancer.  Pt agrees with plan.       Relevant Medications   methocarbamol (ROBAXIN) 500 MG tablet   Weight loss    Will monitor (~15lb weight loss).  She is also planning to schedule f/u with onc as due.         Meds ordered this encounter  Medications   acetaminophen (TYLENOL) 650 MG CR tablet    Sig: Take 2 tablets (1,300 mg total) by mouth 2 (two) times daily as needed for pain.   methocarbamol (ROBAXIN) 500 MG tablet    Sig: Take 1 tablet (500 mg total) by mouth 3 (three) times daily as needed for muscle spasms (sedation precautions).    Dispense:  20 tablet    Refill:  0   No orders of the defined types were placed in this encounter.   Patient Instructions  Trial muscle relaxant sent to pharmacy.  Let us know if not improving over the next week to get lumbar xrays (in [place]).  May continue oxycodone up to 3 times a day as needed, but use sparingly. Start with tylenol '1300mg'$  twice daily first.   Follow up plan: Return if symptoms worsen or fail to improve.  [name], MD

## 2021-07-05 NOTE — Patient Instructions (Addendum)
Trial muscle relaxant sent to pharmacy.  Let us know if not improving over the next week to get lumbar xrays (in [place]).  May continue oxycodone up to 3 times a day as needed, but use sparingly. Start with tylenol '1300mg'$  twice daily first.

## 2021-07-12 ENCOUNTER — Encounter: Payer: Medicare Other | Attending: Internal Medicine | Admitting: Internal Medicine

## 2021-07-12 ENCOUNTER — Other Ambulatory Visit
Admission: RE | Admit: 2021-07-12 | Discharge: 2021-07-12 | Disposition: A | Discharge status: Home / Self Care | Payer: Medicare Other | Source: Ambulatory Visit | Attending: Internal Medicine | Admitting: Internal Medicine

## 2021-07-12 ENCOUNTER — Other Ambulatory Visit: Payer: Self-pay

## 2021-07-12 DIAGNOSIS — B999 Unspecified infectious disease: Secondary | ICD-10-CM | POA: Insufficient documentation

## 2021-07-12 DIAGNOSIS — L03116 Cellulitis of left lower limb: Secondary | ICD-10-CM | POA: Diagnosis not present

## 2021-07-12 DIAGNOSIS — L97821 Non-pressure chronic ulcer of other part of left lower leg limited to breakdown of skin: Secondary | ICD-10-CM | POA: Insufficient documentation

## 2021-07-12 DIAGNOSIS — Z87891 Personal history of nicotine dependence: Secondary | ICD-10-CM | POA: Insufficient documentation

## 2021-07-12 DIAGNOSIS — Z86718 Personal history of other venous thrombosis and embolism: Secondary | ICD-10-CM | POA: Insufficient documentation

## 2021-07-12 DIAGNOSIS — I890 Lymphedema, not elsewhere classified: Secondary | ICD-10-CM | POA: Insufficient documentation

## 2021-07-12 DIAGNOSIS — S81801A Unspecified open wound, right lower leg, initial encounter: Secondary | ICD-10-CM | POA: Insufficient documentation

## 2021-07-12 DIAGNOSIS — L97221 Non-pressure chronic ulcer of left calf limited to breakdown of skin: Secondary | ICD-10-CM | POA: Diagnosis not present

## 2021-07-12 DIAGNOSIS — Z8349 Family history of other endocrine, nutritional and metabolic diseases: Secondary | ICD-10-CM | POA: Insufficient documentation

## 2021-07-12 DIAGNOSIS — Z8249 Family history of ischemic heart disease and other diseases of the circulatory system: Secondary | ICD-10-CM | POA: Insufficient documentation

## 2021-07-12 DIAGNOSIS — Z7902 Long term (current) use of antithrombotics/antiplatelets: Secondary | ICD-10-CM | POA: Insufficient documentation

## 2021-07-12 NOTE — Progress Notes (Addendum)
NYIA, CORNIA ([id number]) Visit Report for 07/12/2021 Arrival Information Details Patient Name: [name], [name]. Date of Service: 07/12/2021 1:15 PM Medical Record Number: [id number] Patient Account Number: [id number] Date of Birth/Sex: December 05, 1945 (75 y.o. F) Treating RN: Donnamarie Poag Primary Care Oprah Camarena: [name] Other Clinician: Referring Latika Kronick: [name] Treating Kamarian Sahakian/Extender: Yaakov Guthrie in Treatment: 13 Visit Information History Since Last Visit Added or deleted any medications: Yes Patient Arrived: Ambulatory Had a fall or experienced change in No Arrival Time: 13:17 activities of daily living that may affect Accompanied By: self risk of falls: Transfer Assistance: None Hospitalized since last visit: No Patient Requires Transmission-Based No Has Dressing in Place as Prescribed: Yes Precautions: Has Compression in Place as Prescribed: Yes Patient Has Alerts: Yes Pain Present Now: Yes Patient Alerts: Patient on Blood Thinner ASPIRIN NOT diabetic Electronic Signature(s) Signed: 07/12/2021 1:48:47 PM By: Donnamarie Poag Entered By: Donnamarie Poag on 07/12/2021 13:21:19 Balsam, [name] -------------------------------------------------------------------------------- Clinic Level of Care Assessment Details Patient Name: [name]. Date of Service: 07/12/2021 1:15 PM Medical Record Number: [id number] Patient Account Number: [id number] Date of Birth/Sex: April 27, 1946 (75 y.o. F) Treating RN: Donnamarie Poag Primary Care Jameica Couts: [name] Other Clinician: Referring Marquest Gunkel: [name] Treating Shawntee Mainwaring/Extender: Yaakov Guthrie in Treatment: 13 Clinic Level of Care Assessment Items TOOL 1 Quantity Score '[]'$  - Use when EandM and Procedure is performed on INITIAL visit 0 ASSESSMENTS - Nursing Assessment / Reassessment '[]'$  - General Physical Exam (combine w/ comprehensive assessment (listed just below) when performed  on new 0 pt. evals) '[]'$  - 0 Comprehensive Assessment (HX, ROS, Risk Assessments, Wounds Hx, etc.) ASSESSMENTS - Wound and Skin Assessment / Reassessment '[]'$  - Dermatologic / Skin Assessment (not related to wound area) 0 ASSESSMENTS - Ostomy and/or Continence Assessment and Care '[]'$  - Incontinence Assessment and Management 0 '[]'$  - 0 Ostomy Care Assessment and Management (repouching, etc.) PROCESS - Coordination of Care '[]'$  - Simple Patient / Family Education for ongoing care 0 '[]'$  - 0 Complex (extensive) Patient / Family Education for ongoing care '[]'$  - 0 Staff obtains Programmer, systems, Records, Test Results / Process Orders '[]'$  - 0 Staff telephones HHA, Nursing Homes / Clarify orders / etc '[]'$  - 0 Routine Transfer to another Facility (non-emergent condition) '[]'$  - 0 Routine Hospital Admission (non-emergent condition) '[]'$  - 0 New Admissions / Biomedical engineer / Ordering NPWT, Apligraf, etc. '[]'$  - 0 Emergency Hospital Admission (emergent condition) PROCESS - Special Needs '[]'$  - Pediatric / Minor Patient Management 0 '[]'$  - 0 Isolation Patient Management '[]'$  - 0 Hearing / Language / Visual special needs '[]'$  - 0 Assessment of Community assistance (transportation, D/C planning, etc.) '[]'$  - 0 Additional assistance / Altered mentation '[]'$  - 0 Support Surface(s) Assessment (bed, cushion, seat, etc.) INTERVENTIONS - Miscellaneous '[]'$  - External ear exam 0 '[]'$  - 0 Patient Transfer (multiple staff / Civil Service fast streamer / Similar devices) '[]'$  - 0 Simple Staple / Suture removal (25 or less) '[]'$  - 0 Complex Staple / Suture removal (26 or more) '[]'$  - 0 Hypo/Hyperglycemic Management (do not check if billed separately) '[]'$  - 0 Ankle / Brachial Index (ABI) - do not check if billed separately Has the patient been seen at the hospital within the last three years: Yes Total Score: 0 Level Of Care: ____ [name] Electronic Signature(s) Signed: 07/12/2021 4:46:08 PM By: Donnamarie Poag Entered By:  Donnamarie Poag on 07/12/2021 14:06:24 President, [name] -------------------------------------------------------------------------------- Compression Therapy Details Patient Name: [name]. Date  of Service: 07/12/2021 1:15 PM Medical Record Number: [id number] Patient Account Number: [id number] Date of Birth/Sex: 1946-04-06 (75 y.o. F) Treating RN: Donnamarie Poag Primary Care [place] Dani: [name] Other Clinician: Referring Antionio Negron: [name] Treating Emillia Weatherly/Extender: Yaakov Guthrie in Treatment: 13 Compression Therapy Performed for Wound Assessment: Wound #13 Left,Distal,Posterior Lower Leg Performed By: Junius Argyle, RN Compression Type: Three Layer Post Procedure Diagnosis Same as Pre-procedure Electronic Signature(s) Signed: 07/12/2021 4:46:08 PM By: Donnamarie Poag Entered By: Donnamarie Poag on 07/12/2021 13:56:24 Decook, [name] -------------------------------------------------------------------------------- Encounter Discharge Information Details Patient Name: [name]. Date of Service: 07/12/2021 1:15 PM Medical Record Number: [id number] Patient Account Number: [id number] Date of Birth/Sex: 30-Oct-1946 (75 y.o. F) Treating RN: Donnamarie Poag Primary Care Merrill Villarruel: [name] Other Clinician: Referring Evalyse Stroope: [name] Treating Lizbett Garciagarcia/Extender: Yaakov Guthrie in Treatment: 13 Encounter Discharge Information Items Post Procedure Vitals Discharge Condition: Stable Temperature (F): 98.8 Ambulatory Status: Ambulatory Pulse (bpm): 70 Discharge Destination: Home Respiratory Rate (breaths/min): 16 Transportation: Private Auto Blood Pressure (mmHg): 139/70 Accompanied By: self Schedule Follow-up Appointment: Yes Clinical Summary of Care: Electronic Signature(s) Signed: 07/12/2021 4:46:08 PM By: Donnamarie Poag Entered By: Donnamarie Poag on 07/12/2021 14:18:30 Shuck, [name]  ([id number]) -------------------------------------------------------------------------------- Lower Extremity Assessment Details Patient Name: TAYLIN, BELLIZZI. Date of Service: 07/12/2021 1:15 PM Medical Record Number: [id number] Patient Account Number: [id number] Date of Birth/Sex: [name] 19, 1946 (75 y.o. F) Treating RN: Donnamarie Poag Primary Care Kamy [name]: [name] Other Clinician: Referring Keslee Harrington: [name] Treating Tobie Hellen/Extender: Yaakov Guthrie in Treatment: 13 Edema Assessment Assessed: [name]: Yes] Patrice Paradise: No] [Left: Edema] [Right: :] Calf Left: Right: Point of Measurement: 32 cm From Medial Instep 42 cm Ankle Left: Right: Point of Measurement: 10 cm From Medial Instep 22 cm Knee To Floor Left: Right: From Medial Instep 42 cm Vascular Assessment Pulses: Dorsalis Pedis Palpable: [Left:Yes] Electronic Signature(s) Signed: 07/12/2021 1:48:47 PM By: Donnamarie Poag Entered By: Donnamarie Poag on 07/12/2021 13:32:39 Batie, [name] -------------------------------------------------------------------------------- Multi Wound Chart Details Patient Name: QWYNN, ROCHER. Date of Service: 07/12/2021 1:15 PM Medical Record Number: [id number] Patient Account Number: [id number] Date of Birth/Sex: 1946-06-22 (75 y.o. F) Treating RN: Donnamarie Poag Primary Care Susana Duell: [name] Other Clinician: Referring Joniece Smotherman: [name] Treating Yoshio Seliga/Extender: Yaakov Guthrie in Treatment: 13 Vital Signs Height(in): 13 Pulse(bpm): 68 Weight(lbs): 212 Blood Pressure(mmHg): 139/70 Body Mass Index(BMI): 38 Temperature(F): 98.8 Respiratory Rate(breaths/min): 16 Photos: [N/A:N/A] Wound Location: Left, Distal, Posterior Lower Leg N/A N/A Wounding Event: Gradually Appeared N/A N/A Primary Etiology: Venous Leg Ulcer N/A N/A Comorbid History: Cataracts, Asthma, Sleep Apnea, N/A N/A Deep Vein Thrombosis, Hypertension, Peripheral Venous Disease,  Osteoarthritis, Received Chemotherapy, Received Radiation Date Acquired: 02/24/2021 N/A N/A Weeks of Treatment: 13 N/A N/A Wound Status: Open N/A N/A Measurements L x W x D (cm) 1.6x1x0.2 N/A N/A Area (cm) : 1.257 N/A N/A Volume (cm) : 0.251 N/A N/A % Reduction in Area: 78.70% N/A N/A % Reduction in Volume: 57.40% N/A N/A Classification: Full Thickness Without Exposed N/A N/A Support Structures Exudate Amount: Medium N/A N/A Exudate Type: Serosanguineous N/A N/A Exudate Color: red, brown N/A N/A Granulation Amount: Medium (34-66%) N/A N/A Granulation Quality: Red, Pink N/A N/A Necrotic Amount: Medium (34-66%) N/A N/A Exposed Structures: Fat Layer (Subcutaneous Tissue): N/A N/A Yes Fascia: No Tendon: No Muscle: No Joint: No Bone: No Epithelialization: Medium (34-66%) N/A N/A Debridement: Debridement - Excisional N/A N/A Pre-procedure Verification/Time 13:59 N/A N/A Out Taken: Pain Control: Lidocaine N/A N/A Tissue Debrided: Subcutaneous, Slough N/A N/A Level:  Skin/Subcutaneous Tissue N/A N/A Debridement Area (sq cm): 1.6 N/A N/A Instrument: Curette N/A N/A Bleeding: Minimum N/A N/A Hemostasis Achieved: Pressure N/A N/A Procedure was tolerated well N/A N/A EDRA, BIGGINS ([id number]) Debridement Treatment Response: Post Debridement 1.6x1x0.2 N/A N/A Measurements L x W x D (cm) Post Debridement Volume: 0.251 N/A N/A (cm) Procedures Performed: Compression Therapy N/A N/A Debridement Treatment Notes Wound #13 (Lower Leg) Wound Laterality: Left, Posterior, Distal Cleanser Soap and Water Discharge Instruction: Gently cleanse wound with antibacterial soap, rinse and pat dry prior to dressing wounds Peri-Wound Care Topical Gentamicin Discharge Instruction: to wound bed Apply as directed by Antonietta Lansdowne. Triamcinolone Acetonide Cream, 0.1%, 15 (g) tube Discharge Instruction: Peri wound Apply as directed by Dierre Crevier. Primary Dressing Prisma 4.34 (in) Discharge  Instruction: Moisten w/normal saline or sterile water; Cover wound as directed. Do not remove from wound bed. Secondary Dressing ABD Pad 5x9 (in/in) Discharge Instruction: Cover with ABD pad Secured With Compression Wrap Medichoice 4 layer Compression System, 35-40 mmHG Discharge Instruction: Apply multi-layer wrap as directed. Compression Stockings Add-Ons Electronic Signature(s) Signed: 07/12/2021 3:10:56 PM By: Kalman Shan DO Entered By: Kalman Shan on 07/12/2021 14:26:12 NUSAIBAH, HEIDEL ([id number]) -------------------------------------------------------------------------------- Multi-Disciplinary Care Plan Details Patient Name: NADJA, MCCONAUGHY. Date of Service: 07/12/2021 1:15 PM Medical Record Number: [id number] Patient Account Number: [id number] Date of Birth/Sex: [birth year] (75 y.o. F) Treating RN: Donnamarie Poag Primary Care Kayra Crowell: [name] Other Clinician: Referring Chukwuemeka Artola: [name] Treating Lydiah Pong/Extender: Yaakov Guthrie in Treatment: 13 Active Inactive Venous Leg Ulcer Nursing Diagnoses: Actual venous Insuffiency (use after diagnosis is confirmed) Knowledge deficit related to disease process and management Potential for venous Insuffiency (use before diagnosis confirmed) Goals: Patient will maintain optimal edema control Date Initiated: 04/12/2021 Target Resolution Date: 08/26/2021 Goal Status: Active Interventions: Assess peripheral edema status every visit. Compression as ordered Provide education on venous insufficiency Treatment Activities: Therapeutic compression applied : 04/12/2021 Notes: Electronic Signature(s) Signed: 07/12/2021 1:48:47 PM By: Donnamarie Poag Entered By: Donnamarie Poag on 07/12/2021 13:32:57 Balik, Iyanna J. ([id number]) -------------------------------------------------------------------------------- Pain Assessment Details Patient Name: ADWOA, PRESTWOOD. Date of Service: 07/12/2021 1:15 PM Medical Record  Number: [id number] Patient Account Number: [id number] Date of Birth/Sex: 06-07-1946 (75 y.o. F) Treating RN: Donnamarie Poag Primary Care Kayann Maj: [name] Other Clinician: Referring Dalayza Zambrana: [name] Treating Kamerin Grumbine/Extender: Yaakov Guthrie in Treatment: 13 Active Problems Location of Pain Severity and Description of Pain Patient Has Paino Yes Site Locations Rate the pain. Current Pain Level: 3 Pain Management and Medication Current Pain Management: Electronic Signature(s) Signed: 07/12/2021 1:48:47 PM By: Donnamarie Poag Entered By: Donnamarie Poag on 07/12/2021 13:21:52 Deetz, [name] ([id number]) -------------------------------------------------------------------------------- Patient/Caregiver Education Details Patient Name: SWAYZIE, PISTONE. Date of Service: 07/12/2021 1:15 PM Medical Record Number: [id number] Patient Account Number: [id number] Date of Birth/Gender: 07/21/1946 (75 y.o. F) Treating RN: Donnamarie Poag Primary Care Physician: [name] Other Clinician: Referring Physician: [name] Treating Physician/Extender: Yaakov Guthrie in Treatment: 13 Education Assessment Education Provided To: Patient Education Topics Provided Venous: Wound Debridement: Wound/Skin Impairment: Electronic Signature(s) Signed: 07/12/2021 4:46:08 PM By: Donnamarie Poag Entered By: Donnamarie Poag on 07/12/2021 14:07:00 Delamar, [name] ([id number]) -------------------------------------------------------------------------------- Wound Assessment Details Patient Name: ANIESHA, HINKS. Date of Service: 07/12/2021 1:15 PM Medical Record Number: [id number] Patient Account Number: [id number] Date of Birth/Sex: [birth year] (75 y.o. F) Treating RN: Donnamarie Poag Primary Care Raushanah Osmundson: [name] Other Clinician: Referring Brenetta Penny: [name] Treating Taira Knabe/Extender: Yaakov Guthrie in Treatment: 13 Wound Status Wound Number: 13 Primary Venous Leg  Ulcer Etiology: Wound Location: Left, Distal, Posterior Lower Leg Wound Open Wounding Event: Gradually Appeared Status: Date Acquired: 02/24/2021 Comorbid Cataracts, Asthma, Sleep Apnea, Deep Vein Thrombosis, Weeks Of Treatment: 13 History: Hypertension, Peripheral Venous Disease, Osteoarthritis, Clustered Wound: No Received Chemotherapy, Received Radiation Photos Wound Measurements Length: (cm) 1.6 Width: (cm) 1 Depth: (cm) 0.2 Area: (cm) 1.257 Volume: (cm) 0.251 % Reduction in Area: 78.7% % Reduction in Volume: 57.4% Epithelialization: Medium (34-66%) Tunneling: No Undermining: No Wound Description Classification: Full Thickness Without Exposed Support Structures Exudate Amount: Medium Exudate Type: Serosanguineous Exudate Color: red, brown Foul Odor After Cleansing: No Slough/Fibrino Yes Wound Bed Granulation Amount: Medium (34-66%) Exposed Structure Granulation Quality: Red, Pink Fascia Exposed: No Necrotic Amount: Medium (34-66%) Fat Layer (Subcutaneous Tissue) Exposed: Yes Necrotic Quality: Adherent Slough Tendon Exposed: No Muscle Exposed: No Joint Exposed: No Bone Exposed: No Treatment Notes Wound #13 (Lower Leg) Wound Laterality: Left, Posterior, Distal Cleanser Soap and Water Discharge Instruction: Gently cleanse wound with antibacterial soap, rinse and pat dry prior to dressing wounds Peri-Wound Care LUN, BREIT ([id number]) Topical Gentamicin Discharge Instruction: to wound bed Apply as directed by Donicia Druck. Triamcinolone Acetonide Cream, 0.1%, 15 (g) tube Discharge Instruction: Peri wound Apply as directed by Hardie Veltre. Primary Dressing Prisma 4.34 (in) Discharge Instruction: Moisten w/normal saline or sterile water; Cover wound as directed. Do not remove from wound bed. Secondary Dressing ABD Pad 5x9 (in/in) Discharge Instruction: Cover with ABD pad Secured With Compression Wrap Medichoice 4 layer Compression System, 35-40  mmHG Discharge Instruction: Apply multi-layer wrap as directed. Compression Stockings Add-Ons Electronic Signature(s) Signed: 07/12/2021 1:48:47 PM By: Donnamarie Poag Entered By: Donnamarie Poag on 07/12/2021 13:30:01 JONISHA, LEPPO ([id number]) -------------------------------------------------------------------------------- Vitals Details Patient Name: LINDI, TRIAS. Date of Service: 07/12/2021 1:15 PM Medical Record Number: [id number] Patient Account Number: [id number] Date of Birth/Sex: January 21, 1946 (75 y.o. F) Treating RN: Donnamarie Poag Primary Care Takasha Vetere: [name] Other Clinician: Referring Massie Cogliano: [name] Treating Meade Hogeland/Extender: Yaakov Guthrie in Treatment: 13 Vital Signs Time Taken: 13:20 Temperature (F): 98.8 Height (in): 63 Pulse (bpm): 70 Weight (lbs): 212 Respiratory Rate (breaths/min): 16 Body Mass Index (BMI): 37.6 Blood Pressure (mmHg): 139/70 Reference Range: 80 - 120 mg / dl Electronic Signature(s) Signed: 07/12/2021 1:48:47 PM By: Donnamarie Poag Entered ByDonnamarie Poag on 07/12/2021 13:21:43

## 2021-07-12 NOTE — Progress Notes (Signed)
ARVIS, GERVASI ([id number]) Visit Report for 07/12/2021 Chief Complaint Document Details Patient Name: [name], [name]. Date of Service: 07/12/2021 1:15 PM Medical Record Number: [id number] Patient Account Number: [id number] Date of Birth/Sex: 10-Apr-1946 (75 y.o. F) Treating RN: Donnamarie Poag Primary Care Provider: [name] Other Clinician: Referring Provider: [name] Treating Provider/Extender: Yaakov Guthrie in Treatment: 13 Information Obtained from: Patient Chief Complaint Left calf venous stasis ulceration. 11/13/16; the patient is here for a left calf recurrent ulceration which is painful and has been present for the last month 01/22/17; the patient re-presents here for recurrent difficulties with blistering and leaking fluid on her left posterior calf 12/10/18; the patient returns to clinic for a wound on the left medial calf 04/12/2021; left lower extremity wound, 05/31/21; right leg wound Electronic Signature(s) Signed: 07/12/2021 3:10:56 PM By: Kalman Shan DO Entered By: Kalman Shan on 07/12/2021 14:27:42 Kegley, Modesta Lenna Sciara ([id number]) -------------------------------------------------------------------------------- Debridement Details Patient Name: [name]. Date of Service: 07/12/2021 1:15 PM Medical Record Number: [id number] Patient Account Number: [id number] Date of Birth/Sex: Mar 27, 1946 (75 y.o. F) Treating RN: Donnamarie Poag Primary Care Provider: [name] Other Clinician: Referring Provider: [name] Treating Provider/Extender: Yaakov Guthrie in Treatment: 13 Debridement Performed for Wound #13 Left,Distal,Posterior Lower Leg Assessment: Performed By: Physician Kalman Shan, MD Debridement Type: Debridement Severity of Tissue Pre Debridement: Fat layer exposed Level of Consciousness (Pre- Awake and Alert procedure): Pre-procedure Verification/Time Out Yes - 13:59 Taken: Start Time: 13:59 Pain Control:  Lidocaine Total Area Debrided (L x W): 1.6 (cm) x 1 (cm) = 1.6 (cm) Tissue and other material Viable, Non-Viable, Slough, Subcutaneous, Biofilm, Slough debrided: Level: Skin/Subcutaneous Tissue Debridement Description: Excisional Instrument: Curette Specimen: Tissue Culture Number of Specimens Taken: 1 Bleeding: Minimum Hemostasis Achieved: Pressure Response to Treatment: Procedure was tolerated well Level of Consciousness (Post- Awake and Alert procedure): Post Debridement Measurements of Total Wound Length: (cm) 1.6 Width: (cm) 1 Depth: (cm) 0.2 Volume: (cm) 0.251 Character of Wound/Ulcer Post Debridement: Improved Severity of Tissue Post Debridement: Fat layer exposed Post Procedure Diagnosis Same as Pre-procedure Electronic Signature(s) Signed: 07/12/2021 3:10:56 PM By: Kalman Shan DO Signed: 07/12/2021 4:46:08 PM By: Donnamarie Poag Entered By: Donnamarie Poag on 07/12/2021 14:02:23 Muchow, [name] -------------------------------------------------------------------------------- HPI Details Patient Name: Amber, [name]. Date of Service: 07/12/2021 1:15 PM Medical Record Number: [id number] Patient Account Number: [id number] Date of Birth/Sex: October 22, 1946 (75 y.o. F) Treating RN: Donnamarie Poag Primary Care Provider: [name] Other Clinician: Referring Provider: [name] Treating Provider/Extender: Yaakov Guthrie in Treatment: 13 History of Present Illness HPI Description: Pleasant 75 year old with history of chronic venous insufficiency. No diabetes or peripheral vascular disease. Left ABI 1.29. Questionable history of left lower extremity DVT. She developed a recurrent ulceration on her left lateral calf in December 2015, which she attributes to poor diet and subsequent lower extremity edema. She underwent endovenous laser ablation of her left greater saphenous vein in 2010. She underwent laser ablation of accessory branch of left GSV in  April 2016 by Dr. Kellie Simmering at [facility]. She was previously wearing Unna boots, which she tolerated well. Tolerating 2 layer compression and cadexomer iodine. She returns to clinic for follow-up and is without new complaints. She denies any significant pain at this time. She reports persistent pain with pressure. No claudication or ischemic rest pain. No fever or chills. No drainage. READMISSION 11/13/16; this is a 75 year old woman who is not a diabetic. She is here for a review of a painful area on her  left medial lower extremity. I note that she was seen here previously last year for wound I believe to be in the same area. At that time she had undergone previously a left greater saphenous vein ablation by Dr. Kellie Simmering and she had a ablation of the anterior accessory branch of the left greater saphenous vein in March 2016. Seeing that the wound actually closed over. In reviewing the history with her today the ulcer in this area has been recurrent. She describes a biopsy of this area in 2009 that only showed stasis physiology. She also has a history of today malignant melanoma in the right shoulder for which she follows with Dr. 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 Do not see that she has ever had arterial studies in the left leg. She tells me she has a follow-up with Dr. Kellie Simmering in roughly 10 days In any case she developed the reopening of this area roughly a month ago. On the background of this she describes rapidly increasing edema which has responded to Lasix 40 mg and metolazone 2.5 mg as well as the patient's lymph massage. She has been told she has both venous insufficiency and lymphedema but she cannot tolerate compression stockings 11/28/16; the patient saw Dr. Kellie Simmering recently. Per the patient he did arterial Dopplers in the office that did not show evidence of  arterial insufficiency, per the patient he stated "treat this like an ordinary venous ulcer". She also saw her dermatologist Dr. Ronnald Ramp who felt that this was more of a vascular ulcer. In general things are improving although she arrives today with increasing bilateral lower extremity edema with weeping a deeper fluid through the wound on the left medial leg compatible with some degree of lymphedema 12/04/16; the patient's wound is fully epithelialized but I don't think fully healed. We will do another week of depression with Promogran and TCA however I suspect we'll be able to discharge her next week. This is a very unusual-looking wound which was initially a figure-of-eight type wound lying on its side surrounded by petechial like hemorrhage. She has had venous ablation on this side. She apparently does not have an arterial issue per Dr. Kellie Simmering. She saw her dermatologist thought it was "vascular". Patient is definitely going to need ongoing compression and I talked about this with her today she will go to elastic therapy after she leaves here next week 12/11/16; the patient's wound is not completely closed today. She has surrounding scar tissue and in further discussion with the patient it would appear that she had ulcers in this area in 2009 for a prolonged period of time ultimately requiring a punch biopsy of this area that only showed venous insufficiency. I did not previously pickup on this part of the history from the patient. 12/18/16; the patient's wound is completely epithelialized. There is no open area here. She has significant bilateral venous insufficiency with secondary lymphedema to a mild-to-moderate degree she does not have compression stockings.. She did not say anything to me when I was in the room, she told our intake nurse that she was still having pain in this area. This isn't unusual recurrent small open area. She is going to go to elastic therapy to obtain compression  stockings. 12/25/16; the patient's wound is fully epithelialized. There is no open area here. The patient describes some continued episodic discomfort in this area medial left  calf. However everything looks fine and healed here. She is been to elastic therapy and caught herself 15-20 mmHg stockings, they apparently were having trouble getting 20-30 mm stockings in her size 01/22/17; this is a patient we discharged from the clinic a month ago. She has a recurrent open wound on her medial left calf. She had 15 mm support stockings. I told her I thought she needed 20-30 mm compression stockings. She tells me that she has been ill with hospitalization secondary to asthma and is been found to have severe hypokalemia likely secondary to a combination of Lasix and metolazone. This morning she noted blistering and leaking fluid on the posterior part of her left leg. She called our intake nurse urgently and we was saw her this afternoon. She has not had any real discomfort here. I don't know that she's been wearing any stockings on this leg for at least 2-3 days. ABIs in this clinic were 1.21 on the right and 1.3 on the left. She is previously seen vascular surgery who does not think that there is a peripheral arterial issue. 01/30/17; Patient arrives with no open wound on the left leg. She has been to elastic therapy and obtained 20-58mhg below knee stockings and she has one on the right leg today. READMISSION 02/19/18; this PCowlesis a now 75year old patient we've had in this clinic perhaps 3 times before. I had last looked at her from January 07 December 2016 with an area on the medial left leg. We discharged her on 12/25/16 however she had to be readmitted on 01/22/17 with a recurrence. I have in my notes that we discharged her on 20-30 mm stockings although she tells me she was only wearing support hose because she cannot get stockings on predominantly related to her cervical spine surgery/issues. She has [place]  [place] the left with an anterior accessory branch ablation I think both of these were in 2016. On one of the previous visit she had a biopsy noted 2009 that was negative. She is not felt to have an arterial issue. She is not a diabetic. She does have a history of obstructive sleep apnea hypertension asthma as well as chronic venous insufficiency and lymphedema. On this occasion she noted 2 dry scaly patch on her left leg. She tried to put lotion on this it didn't really help. There were 2 open areas.the patient has been seeing her primary physician from 02/05/18 through 02/14/18. She had Unna boots applied. The superior wound now on the lateral left leg has closed but she's had one wound that remains open on the lateral left leg. This is not the same spot as we dealt with in 2018. ABIs in this clinic were 1.3 bilaterally PZARELA, ROBBINS([id number] 02/26/18; patient has a small wound on the left lateral calf. Dimensions are down. She has chronic venous insufficiency and lymphedema. 03/05/18; small open area on the left lateral calf. Dimensions are down. Tightly adherent necrotic debris over the surface of the wound which was difficult to remove. Also the dressing [over collagen] stuck to the wound surface. This was removed with some difficulty as well. Change the primary dressing to Hydrofera Blue ready 03/12/18; small open area on the left lateral calf. Comes in with tightly adherent surface eschar as well as some adherent Hydrofera Blue. 03/19/18; open area on the left lateral calf. Again adherent surface eschar as well as some adherent Hydrofera  Blue nonviable subcutaneous tissue. She complained of pain all week even with the reduction from 4-3 layer compression I put on last week. Also she had an increase in her ankle and calf measurements probably related to the same thing. 03/26/18; open area on the left  lateral calf. A very small open area remains here. We used silver alginate starting last week as the Hydrofera Blue seem to stick to the wound bed. In using 4-layer compression 04/02/18; the open area in the left lateral calf at some adherent slough which I removed there is no open area here. We are able to transition her into her own compression stocking. Truthfully I think this is probably his support hose. However this does not maintain skin integrity will be limited. She cannot put over the toe compression stockings on because of neck problems hand problems etc. She is allergic to the lining layer of juxta lites. We might be forced to use extremitease stocking should this fail READMIT 11/24/2018 Patient is now a 75 year old woman who is not a diabetic. She has been in this clinic on at least 3 previous occasions largely with recurrent wounds on her left leg secondary to chronic venous insufficiency with secondary lymphedema. Her situation is complicated by inability to get stockings on and an allergy to neoprene which is apparently a component and at least juxta lites and other stockings. As a result she really has not been wearing any stockings on her legs. She tells [place] that roughly 2 or 3 weeks ago she started noticing a stinging sensation just above her ankle on the left medial aspect. She has been diagnosed with pseudogout and she wondered whether this was what she was experiencing. She tried to dress this with something she bought at the store however subsequently it pulled skin off and now she has an open wound that is not improving. She has been using Vaseline gauze with a cover bandage. She saw her primary doctor last week who put an Haematologist on her. ABIs in this clinic was 1.03 on the left 2/12; the area is on the left medial ankle. Odd-looking wound with what looks to be surface epithelialization but a multitude of small petechial openings. This clearly not closed yet. We have been  using silver alginate under 3 layer compression with TCA 2/19; the wound area did not look quite as good this week. Necrotic debris over the majority of the wound surface which required debridement. She continues to have a multitude of what looked to be small petechial openings. She reminds [place] that she had a biopsy on this initially during her first outbreak in 2015 in [place] [facility]. She expresses concern about this being a possible melanoma. 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 I assured her that this does not look anything like melanoma. Except for the petechial reaction it does look like a venous insufficiency area and she certainly has evidence of this on both sides 2/26; a difficult area on the left medial ankle. The patient clearly has chronic venous hypertension with some degree of lymphedema. The odd thing about the area is the small petechial hemorrhages. I am not really sure how to explain this. This was present last time and this is not a compression injury. We have been using Hydrofera Blue which I changed to last week 3/4; still using Hydrofera Blue. Aggressive debridement today. She does not have known arterial issues. She has  seen Dr. Kellie Simmering at [facility] vein and vascular and and has an ablation on the left. [Anterior accessory branch of the greater saphenous]. From what I remember they did not feel she had an arterial issue. The patient has had this area biopsied in 2009 at [facility] dermatology and by her recollection they said this was "stasis". She is also follow-up with dermatology locally who thought that this was more of a vascular issue 3/11; using Hydrofera Blue. Aggressive debridement today. She does not have an arterial issue. We are using 3 layer compression although we may need to go to 4. The patient has been in for multiple changes to her wrap since I last saw her a  week ago. She says that the area was leaking. I do not have too much more information on what was found 01/19/19 on evaluation today patient was actually being seen for a nurse visit when unfortunately she had the area on her left lateral lower extremity as well as weeping from the right lower extremity that became apparent. Therefore we did end up actually seeing her for a full visit with myself. She is having some pain at this site as well but fortunately nothing too significant at this point. No fevers, chills, nausea, or vomiting noted at this time. 3/18-Patient is back to the clinic with the left leg venous leg ulcer, the ulcer is larger in size, has a surface that is densely adherent with fibrinous tissue, the Hydrofera Blue was used but is densely adherent and there was difficulty in removing it. The right lower extremity was also wrapped for weeping edema. Patient has a new area over the left lateral foot above the malleolus that is small and appears to have no debris with intact surrounding skin. Patient is on increased dose of Lasix also as a means to edema management 3/25; the patient has a nonhealing venous ulcer on the medial left leg and last week developed a smaller area on the lateral left calf. We have been using Hydrofera Blue with a contact layer. 4/1; no major change in these wounds areas. Left medial and more recently left lateral calf. I tried Iodoflex last week to aid in debridement she did not tolerate this. She stated her pain was terrible all week. She took the top layer of the 4 layer compression off. 4/8; the patient actually looks somewhat better in terms of her more prominent left lateral calf wound. There is some healthy looking tissue here. She is still complaining of a lot of discomfort. 4/15; patient in a lot of pain secondary to sciatica. She is on a prednisone taper prescribed by her primary physician. She has the 2 areas one on the left medial and more recently a  smaller area on the left lateral calf. Both of these just above the malleoli 4/22; her back pain is better but she still states she is very uncomfortable and now feels she is intolerant to the [employer]. No real change in the wounds we have been using Sorbact. She has been previously intolerant to Iodoflex. There is not a lot of option about what we can use to debride this wound under compression that she no doubt needs. sHe states Ultram no longer works for her pain 4/29; no major change in the wounds slightly increased depth. Surface on the original medial wound perhaps somewhat improved however the more recent area on the lateral left ankle is 100% covered in very adherent debris we have been using Sorbact. She tolerates 4  layer compression well and her edema control is a lot better. She has not had to come in for a nurse check 5/6; no major change in the condition of the wounds. She did consent to debridement today which was done with some difficulty. Continuing Sorbact. She did not tolerate Iodoflex. She was in for a check of her compression the day after we wrapped her last week this was adjusted but nothing much was found 5/13; no major change in the condition or area of the wounds. I was able to get a fairly aggressive debridement done on the lateral left leg wound. Even using Sorbact under compression. She came back in on Friday to have the wrap changed. She says she felt uncomfortable on the lateral aspect of her ankle. She has a long history of chronic venous insufficiency including previous ablation surgery on this side. 5/20-Patient returns for wounds on left leg with both wounds covered in slough, with the lateral leg wound larger in size, she has been in 3 layer compression and felt more comfortable, she describes pain in ankle, in leg and pins and needles in foot, and is about to try Pamelor for this 6/3; wounds on the left lateral and left medial leg. The area medially which is the  most recent of the 2 seems to have had the largest increase in [place], LAURA BALBUENA. ([id number]) dimensions. We have been using Sorbac to try and debride the surface. She has been to see orthopedics they apparently did a plain x-ray that was indeterminant. Diagnosed her with neuropathy and they have ordered an MRI to determine if there is underlying osteomyelitis. This was not high on my thought list but I suppose it is prudent. We have advised her to make an appointment with vein and vascular in [place]. She has a history of a left greater saphenous and accessory vein ablations I wonder if there is anything else that can be done from a surgical point of view to help in these difficult refractory wounds. We have previously healed this wound on one occasion but it keeps on reopening [medial side] 6/10; deep tissue culture I did last week I think on the left medial wound showed both moderate E. coli and moderate staph aureus [MSSA]. She is going to require antibiotics and I have chosen Augmentin. We have been using Sorbact and we have made better looking wound surface on both sides but certainly no improvement in wound area. She was back in last Friday apparently for a dressing changes the wrap was hurting her outer left ankle. She has not managed to get a hold of vein and vascular in [place]. We are going to have to make her that appointment 6/17; patient is tolerating the Augmentin. She had an MRI that I think was ordered by orthopedic surgeon this did not show osteomyelitis or an abscess did suggest cellulitis. We have been using Sorbact to the lateral and medial ankles. We have been trying to arrange a follow-up appointment with vein and vascular in [place] or did her original ablations. We apparently an area sent the request to vein and vascular in [place] 6/24; patient has completed the Augmentin. We do not yet have a vein and vascular appointment in [place]. I am not sure what the issue  is here we have asked her to call tomorrow. We are using Sorbact. Making some improvements and especially the medial wound. Both surfaces however look better medial and lateral. 7/1; the patient has been in contact with vein and  vascular in [place] but has not yet received an appointment. Using Sorbact we have gradually improve the wound surface with no improvement in surface area. She is approved for Apligraf but the wound surface still is not completely viable. She has not had to come in for a dressing change 7/8; the patient has an appointment with vein and vascular on 7/31 which is a Friday afternoon. She is concerned about getting back here for [place] to dress her wounds. I think it is important to have them goal for her venous reflux/history of ablations etc. to see if anything else can be done. She apparently tested positive for 1 of the blood tests with regards to lupus and saw a rheumatologist. He has raised the issue of vasculitis again. I have had this thought in the past however the evidence seems overwhelming that this is a venous reflux etiology. If the rheumatologist tells me there is clinical and laboratory investigation is positive for lupus I will rethink this. 7/15; the patient's wound surfaces are quite a bit better. The medial area which was her original wound now has no depth although the lateral wound which was the more recent area actually appears larger. Both with viable surfaces which is indeed better. Using Sorbact. I wanted to use Apligraf on her however there is the issue of the vein and vascular appointment on 7/31 at 2:00 in the afternoon which would not allow her to get back to be rewrapped and they would no doubt remove the graft 7/22; the patient's wound surfaces have moderate amount of debris although generally look better. The lateral one is larger with 2 small satellite areas superiorly. We are waiting for her vein and vascular appointment on 7/31. She has been  approved for Apligraf which I would like to use after th 7/29; wound surfaces have improved no debridement is required we have been using Sorbact. She sees vein and vascular on Friday with this so question of whether anything can be done to lessen the likelihood of recurrence and/or speed the healing of these areas. She is already had previous ablations. She no doubt has severe venous hypertension 8/5-Patient returns at 1 week, she was in [place] for 3 days by her podiatrist, we have been using so backed to the wound, she has increased pain in both the wounds on the left lower leg especially the more distal one on the lateral aspect 8/12-Patient returns at 1 week and she is agreeable to having debridement in both wounds on her left leg today. We have been using Sorbact, and vascular studies were reviewed at last visit 8/19; the patient arrives with her wounds fairly clean and no debridement is required. We have used Sorbact which is really done a nice job in cleaning up these very difficult wound surfaces. The patient saw Dr. Donzetta Matters of vascular surgery on 7/31. He did not feel that there was an arterial component. He felt that her treated greater saphenous vein is adequately addressed and that the small saphenous vein did not appear to be involved significantly. She was also noted to have deep venous reflux which is not treatable. Dr. Donzetta Matters mentioned the possibility of a central obstructive component leading to reflux and he offered her central venography. She wanted to discuss this or think about it. I have urged her to go ahead with this. She has had recurrent difficult wounds in these areas which do heal but after months in the clinic. If there is anything that can be done to  reduce the likelihood of this I think it is worth it. 9/2 she is still working towards getting follow-up with Dr. Donzetta Matters to schedule her CT. Things are quite a bit worse venography. I put Apligraf on 2 weeks ago on both wounds  on the medial and lateral part of her left lower leg. She arrives in clinic today with 3 superficial additional wounds above the area laterally and one below the wound medially. She describes a lot of discomfort. I think these are probably wrapped injuries. Does not look like she has cellulitis. 07/20/2019 on evaluation today patient appears to be doing somewhat poorly in regard to her lower extremity ulcers. She in fact showed signs of erythema in fact we may even be dealing with an infection at this time. Unfortunately I am unsure if this is just infection or if indeed there may be some allergic reaction that occurred as a result of the Apligraf application. With that being said that would be unusual but nonetheless not impossible in this patient is one who is unfortunately allergic to quite a bit. Currently we have been using the Sorbact which seems to do as well as anything for her. I do think we may want to obtain a culture today to see if there is anything showing up there that may need to be addressed. 9/16; noted that last week the wounds look worse in 1 week follow-up of the Apligraf. Using Sorbact as of 2 days ago. She arrives with copious amounts of drainage and new skin breakdown on the back of the left calf. The wounds arm more substantial bilaterally. There is a fair amount of swelling in the left calf no overt DVT there is edema present I think in the left greater than right thigh. She is supposed to go on 9/28 for CT venography. The wounds on the medial and lateral calf are worse and she has new skin breakdown posteriorly at least new for me. This is almost developing into a circumferential wound area The Apligraf was taken off last week which I agree with things are not going in the right direction a culture was done we do not have that back yet. She is on Augmentin that she started 2 days ago 9/23; dressing was changed by her nurses on Monday. In general there is no improvement in the  wound areas although the area looks less angry than last week. She did get Augmentin for MSSA cultured on the 14th. She still appears to have too much swelling in the left leg even with 3 layer compression 9/30; the patient underwent her procedure on 9/28 by Dr. Donzetta Matters at [facility]. She was discovered to have the common iliac vein measuring 12.2 mm but at the level of L4-L5 measured 3 mm. After stenting it measured 10 mm. 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 Rouleaux flow in the common femoral and femoral vein was observed much improved after stenting. We are using silver alginate to the wounds on the medial and lateral ankle on the left. 4 layer compression 10/7; the patient had fluid swelling around her knee and 4 layer compression. At the advice of vein and vascular this was reduced to 3 layer which she is tolerating better. We have been using silver alginate under 3 layer compression since last Friday 10/14; arrives with the areas on the left ankle looking a lot better. Inflammation in the area also a lot better. She came in for a nurse check on  10/9 10/21; continued nice improvement. Slight improvements in surface area of both the medial and lateral wounds on the left. A lot of the satellite lesions in the weeping erythema around these from stasis dermatitis is resolved. We have been using silver alginate SUJEI, RYLE ([id number]) 10/28; general improvement in the entire wound areas although not a lot of change in dimensions the wound certainly looks better. There is a lot less in terms of venous inflammation. Continue silver alginate this week however look towards Hydrofera Blue next week 11/4; very adherent debris on the medial wound left wound is not as bad. We have been using silver alginate. Change to [facility] today 11/11; very adherent debris on both wound areas. She went to vein and vascular last week and follow-up they put in [place] boot on  this today. He says the [facility] was adherent. Wound is definitely not as good as last week. Especially on the left there the satellite lesions look more prominent 11/18; absolutely no better. erythema on lateral aspect with tenderness. 09/30/2019 on evaluation today patient appears to actually be doing better. Dr. Dellia Nims did put her on doxycycline last week which I do believe has helped her at this point. Fortunately there is no signs of active infection at this time. No fevers, chills, nausea, vomiting, or diarrhea. I do believe he may want extend the doxycycline for 7 additional days just to ensure everything does completely cleared up the patient is in agreement with that plan. Otherwise she is going require some sharp debridement today 12/2; patient is completing a 2-week course of doxycycline. I gave her this empirically for inflammation as well as infection when I last saw her 2 weeks ago. All of this seems to be better. She is using silver alginate she has the area on the medial aspect of the larger area laterally and the 2 small satellite regions laterally above the major wound. 12/9; the patient's wound on the left medial and left lateral calf look really quite good. We have been using silver alginate. She saw vein and vascular in follow-up on 10/09/2019. She has had a previous left greater saphenous vein ablation by Dr. Oscar La in 2016. More recently she underwent a left common iliac vein stent by Dr. 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 The swelling is improved and certainly the wounds have improved. The patient shows [place] today area on the right medial calf there is almost no wound but leaking lymphedema. She says she start this started 3 or 4 days ago. She did not traumatize it. It is not painful. She does not wear compression on that side 12/16; the patient continues to do well laterally. Medially still requiring debridement. The area on the right calf did not  materialize to anything and is not currently open. We wrapped this last time. She has support stockings for that leg although I am not sure they are going to provide adequate compression 12/23; the lateral wound looks stable. Medially still requiring debridement for tightly adherent fibrinous debris. We've been using silver alginate. Surface area not any different 12/30; neither wound is any better with regards to surface and the area on the left lateral is larger. I been using silver alginate to the left lateral which look quite good last week and Sorbact to the left medial 11/11/2019. Lateral wound area actually looks better and somewhat smaller. Medial still requires a very aggressive debridement today. We have been using Sorbact on both wound areas 1/13; not  much better still adherent debris bilaterally. I been using Sorbact. She has severe venous hypertension. Probably some degree of dermal fibrosis distally. I wonder whether tighter compression might help and I am going to try that today. We also need to work on the bioburden 1/20; using Sorbact. She has severe venous hypertension status post stent placement for pelvic vein compression. We applied gentamicin last time to see if we could reduce bioburden I had some discussion with her today about the use of pentoxifylline. This is occasionally used in this setting for wounds with refractory venous insufficiency. However this interacts with Plavix. She tells me that she was put on this after stent placement for 3 months. She will call Dr. Claretha Cooper office to discuss 1/27; we are using gentamicin under Sorbact. 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 She has a stent. Wound medially is measuring smaller this week. Laterally measuring slightly larger although she has some satellite lesions superiorly 2/3; gentamicin under Sorbact under 4-layer compression. 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 She has a stent on Plavix. Her wounds are measuring smaller this week. More substantially laterally where there is a satellite lesion superiorly. 2/10; gentamicin under Sorbac. 4-layer compression. Patient communicated with Dr. Donzetta Matters at vein and vascular in [place]. He is okay with the patient coming off Plavix I will therefore start her on pentoxifylline for a 1 month trial. In general her wounds look better today. I had some concerns about swelling in the left thigh however she measures 61.5 on the right and 63 on the mid thigh which does not suggest there is any difficulty. The patient is not describing any pain. 2/17; gentamicin under Sorbac 4-layer compression. She has been on pentoxifylline for 1 week and complains of loose stool. No nausea she is eating and drinking well 2/24; the patient apparently came in 2 days ago for a nurse visit when her wrap fell down. Both areas look a little worse this week macerated medially and satellite lesions laterally. Change to silver alginate today 3/3; wounds are larger today especially medially. She also has more swelling in her foot lower leg and I even noted some swelling in her posterior thigh which is tender. I wonder about the patency of her stent. Fortuitously she sees Dr. Claretha Cooper group on Friday 3/10; Mrs. Fehrmann was seen by vein and vascular on 3/5. The patient underwent ultrasound. There was no evidence of thrombosis involving the IVC no evidence of thrombosis involving the right common iliac vein there is no evidence of thrombosis involving the right external iliac vein the left external vein is also patent. The right common iliac vein stent appears patent bilateral common femoral veins are compressible and appear patent. I was concerned about the left common iliac stent however it looks like this is functional. She has some edema in the posterior thigh that was tender she still has that this week. I also note they had trouble  finding the pulses in her left foot and booked her for an ABI baseline in 4 weeks. She will follow up in 6 months for repeat IVC duplex. The patient stopped the pentoxifylline because of diarrhea. It does not look like that was being effective in any case. I have advised her to go back on her aspirin 81 mg tablet, vascular it also suggested this 3/17; comes in today with her wound surfaces a lot better. The excoriations from last week considerably better probably secondary to the TCA. We have been using  silver alginate 3/24; comes in today with smaller wounds both medially and laterally. Both required debridement. There are 2 small satellite areas superiorly laterally. She also has a very odd bandlike area in the mid calf almost looking like there was a weakness in the wrap in a localized area. I would write this off as being this however anteriorly she has a small raised ballotable area that is very tender almost reminiscent of an abscess but there was no obvious purulent surface to it. 02/04/20 upon evaluation today patient appears to be doing fairly well in regard to her wounds today. Fortunately there is no signs of active infection at this time. No fevers, chills, nausea, vomiting, or diarrhea. She has been tolerating the dressing changes without complication. Fortunately I feel like she is showing signs of improvement although has been sometime since have seen her. Nonetheless the area of concern that Dr. Dellia Nims had last week where she had possibly an area of the wrap that was we can allow the leg to bulge appears to be doing significantly better today there is no signs of anything worsening. RICKEA, SCHUTZ ([id number]) 4/7; the patient's wounds on her medial and lateral left leg continue to contract. We have been using a regular alginate. Last week she developed an area on the right medial lower leg which is probably a venous ulcer as well. 4/14; the wounds on her left medial and lateral  lower leg continue to contract. Surface eschar. We have been using regular alginate. The area on the right medial lower leg is closed. We have been putting both legs under 4-layer contraction. The patient went back to see vein and vascular she had arterial studies done which were apparently "quite good" per the patient although I have not read their notes I have never felt she had an arterial issue. 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 This is been longstanding and refractory to exercise, leg elevation and longstanding use of compression wraps in our clinic as well as compression stockings on the times we have been able to get these to heal 4/21; we thought she actually might be close this week however she arrives in clinic with a lot of edema in her upper left calf and into her posterior thigh. This is been an intermittent problem here. She says the wrap fell down but it was replaced with a nurse visit on Monday. We are using calcium alginate to the wounds and the wound sizes there not terribly larger than last week but there is a lot more edema 4/28; again wound edges are smaller on both sides. Her edema is better controlled than last time. She is obtained her compression pumps from [employer] although they have not been to her home to set these up. 5/5; left medial and left lateral both look stable. I am not sure the medial is any smaller. We have been using calcium alginate under 4-layer compression. oShe had an area on the right medial. This was eschared today. We have been wrapping this as well. She does not tolerate external compression stockings due to a history of various contact allergies. She has her compression pumps however the representative from the company is coming on her to show her how to use these tomorrow 5/19; patient with severe chronic venous insufficiency secondary to central venous disease. She had a stent placed in  her left common iliac vein. She has done better since but still difficult to control wounds. She comes  in today with nothing open on the right leg. Her areas on the left medial and left lateral are just about closed. We are using calcium alginate under 4-layer compression. She is using her external compression pumps at home She only has 15-20 support stockings. States she cannot get anything tighter than that on. 03/30/20-Patient returns at 1 week, the wounds on the left leg are both slightly bigger, the last week she was on 3 layer compression which started to slide down. She is starting to use her lymphedema pumps although she stated on 1 day her right ankle started to swell up and she have to stop that day. Unfortunately the open area seem to oscillate between improving to the point of healing and then flaring up all to do with effectiveness of compression or lack of due to the left leg topography not keeping the compression wraps from rolling down 6/2 patient comes in with a 15/20 mmHg stocking on the right leg. She tells me that she developed a lot of swelling in her ankles she saw orthopedics she was felt to possibly be having a flare of pseudogout versus some other type of arthritis. She was put on steroids for a respiratory issue so that helps with the inflammation. She has not been using the pumps all week. She thinks the left thigh is more swollen than usual and I would agree with that. She has an appointment with Dr. Donzetta Matters 9 days or so from now 6/9; both wounds on the left medial and left lateral are smaller. We have been using calcium alginate under compression. She does not have an open wound on the right leg she is using a stocking and her compression pumps things are going well. She has an appointment with Dr. Donzetta Matters with regards to her stent in the left common iliac vein 6/16; the wounds on the left medial and left lateral ankle continues to contract. The patient saw Dr. Donzetta Matters and I think  he seems satisfied. Ordered follow-up venous reflux studies on both sides in September. Cautioned that she may need thigh-high stockings. She has been using calcium alginate under compression on the left and her own stocking on the right leg. She tells [place] there are no open wounds on the right 6/23; left lateral is just about closed. Medial required debridement today. We have been using calcium alginate. Extensive discussion about the compression pumps she is only using these on 25 mmHg states she could not take 40 or 30 when the wrap came out to her home to demonstrate these. He said they should not feel tight 6/30; the left lateral wound has a slight amount of eschar. . The area medially is about the same using Hydrofera Blue. 7/7; left lateral wound still has some eschar. I will remove this next week may be closed. The area medially is very small using Hydrofera Blue with improvement. Unfortunately the stockings fell down. Unfortunately the blisters have developed at the edge of where the wrap fell. When this happened she says her legs hurt she did not use her pumps. We are not open Monday for her to come in and change the wraps and she had an appointment yesterday. She also tells me that she is going to have an MRI of her back. She is having pain radiating into her left anterior leg she thinks her from an L5 disc. She saw Dr. Ellene Route of neurosurgery 7/14; the area on the left lateral ankle area is closed. Still a small area medially  however it looks better as well. We have been using Hydrofera Blue under 4-layer compression 7/21; left lateral ankle is still closed however her wound on the medial left calf is actually larger. This is probably because Hydrofera Blue got stuck to the wound. She came in for a nurse change on Friday and will do that again this week I was concerned about the amount of swelling that she had last week however she is using her compression pumps twice a day and the swelling  seems well controlled 7/28; remaining wound on the left medial lower leg is smaller. We have been using moistened silver collagen under compression she is coming back for a nurse visit. For reasons that were not really clear she was just keeping her legs elevated and not using her compression pumps. I have asked her to use the compression pumps. She does not have any wounds on the right leg 06/15/20-Patient returns at 2 weeks, her LLE edema is worse and she developed a blister wound that is new and has bigger posterior calf wound on right, we are using Prisma with pad, 4 layer compression. she has been on lasix 40 mg daily 8/18; patient arrives today with things a lot worse than I remember from a few weeks ago. She was seen last week. Noted that her edema was worse and that she now had a left lateral wound as well as deteriorating edema in the medial and posterior part of the lower leg. She says she is using his or her external compression pumps once a day although I wonder about the compliance. 8/25; weeping area on the right medial lower leg. This had actually gotten a small localized area of her compression stocking wet. oOn the left side there is a large denuded area on the posterior medial lower leg and smaller area on the lateral. This was not the original areas that we dealt with. 9/1 the patient's wound on the left leg include the left lateral and left posterior. Larger superficial wounds weeping. She has very poor edema control. Tender localized edema in the left lower medial ankle/heel probably because of localized wrap issues. She freely admits she is not using the compression pumps. She has been up on her feet a lot. She thinks the hydrofera blue is contributing to the pain she is experiencing.. This is a complaint that I have occasionally heard 9/8; really not much improvement. The patient is still complaining of a lot of pain particularly when she uses compression pumps. I switched  her to silver alginate last time because she found the Hydrofera Blue to be irritating. I don't hear much difference in her description with the silver alginate. 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 She has follow-up with Dr. Donzetta Matters I think for ultrasound next month MACAULEY, BICK ([id number]) 9/15; quite a bit of improvement today. We have less edema and more epithelialization in both of her wound areas on the left medial and left lateral calf. These are not the site of her original wounds in this area. She says she has been using her compression pumps for 30 minutes twice a day, there pain issues that never quite understood. Silver alginate as the primary dressing 9/22; continued improvement. Both areas medially and laterally still have a small open area there is some eschar. She continues to complain of left medial ankle pain. Swelling in the leg is in much  better condition. We have been using silver alginate 9/29; continued improvement. Both areas medially and laterally in the left calf look as though they are close some minor surface eschar but I think this is epithelialized. She comes in today saying she has a ruptured disc at L4-L5 cannot bend over to put on her stockings. 10/6; patient comes in today with no open wounds on either leg. However her edema on the left leg in the upper one third of the lower leg is poorly controlled nonpitting. She says that she could not use the pumps for 2 days and then she has been using the last couple of days. It is not clear to me she has been able to get her stocking on. She has back problems. Mrs. Zipay has severe chronic venous insufficiency with secondary lymphedema. Her venous insufficiency is partially centrally mediated and that she is now post stent in the left common iliac vein. [facility] Thurner's syndrome/physiology]. She follows up with them on 10/15. She wears  20/30 below-knee stockings. She is supposed to use compression pumps at home although I think her compliance about with this is been less than 100%. I have asked her to use these 3 times a day. Finally I think she has lipodermatosclerosis in the left lower leg with an inverted bottle sign. It is been a major problem controlling the edema in the left leg. The right leg we have had wounds on but not as significant a problem is on the left READMISSION 04/12/2021 Mrs. Winand is a 75 year old woman we know well in this clinic. She has severe chronic venous insufficiency. 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 I believe she has had bilateral greater saphenous vein ablation in the past as well. She tells me that this wound opened sometime in March. She had a fall and thinks it was initially abrasion. She developed areas she describes as little blisters on the anterior part of her leg and she saw dermatology and was treated for methicillin staph aureus with several rounds of antibiotics. She has been using support stockings on the left leg and says this is the only thing she can get on. Her compression pump use maybe once a day she says if she did not use one she use the other. She comes in today with incredible swelling in the left leg with a wound on the left posterior calf. She has been using Neosporin to this previously a hydrocolloid. 6/15; patient arrives back for 1 week follow-up.Marland Kitchen Apparently her wrap fell down she did not call us to replace this. He has poor edema control. She only uses her compression pumps once a day 6/29; patient presents for 1 week follow-up. She has tolerated the compression wrap well. We have been using Iodoflex under the wrap. She has no issues or complaints today. 7/6; patient presents for 1 week follow-up. She states that the compression wrap rolled down her leg 4 days ago. She has been trying to keep the area covered but has no  dressings at home to use. She denies signs of infection. 7/13; patient presents for 1 week follow-up. She states that her compression wrap rolled down her right leg and she called our office and had it placed a few days ago. She has been tolerating the current wrap well. She states that the Iodoflex is causing a burning sensation. She denies signs of infection. 7/27; patient presents for 1 week follow-up She has tolerated the  compression wrap well with Hydrofera Blue underneath. She has now developed a wound to her right lower extremity. She reports having a culture done To this area by her dermatologist that she reports is negative. She currently denies signs of infection. 8/30; patient presents for 1 week follow-up. On the left side she has tolerated the compression wrap well with Hydrofera Blue underneath. She reports some discomfort to her right lower extremity with the 3 layer compression. She currently denies signs of infection. 06/14/2021 upon evaluation today patient's wound actually appears to be doing decently well based on what I am seeing currently. She actually has 2 areas 1 on the left distal/posterior lower leg and the other on the right lower leg. Subsequently the measurements are roughly the same may be slightly smaller but in general have not made any trend towards getting worse which is great news. 06/23/2021 upon evaluation today patient appears to be doing pretty well in regard to her wounds. On the right she is having difficulty with sciatic pain and this subsequently has led to her taking the wrap off over the last week her leg is more swollen she is also been on prednisone for 10 days. With that being said I think we may want to just use a compression sock on the left that way she will not have to worry about the compression wrap being in place that she cannot get off. With that being said I do believe as well that the patient is going to need to continue with the wrapping on  the left we will also need to do a little bit of sharp debridement today. 8/24; patient presents for 1 week follow-up. She has no issues or complaints today. She denies signs of infection. 8/31; patient presents for 1 week follow-up. She has no issues or complaints today. She has tolerated the compression wrap well on the left lower extremity. She denies signs of infection. 9/7; patient presents for follow-up. She reports that the compression wrap rolled down slightly on her right lower extremity. She reports tenderness to the wound bed. She denies increased warmth or erythema to the surrounding skin. She overall feels well. Electronic Signature(s) Signed: 07/12/2021 3:10:56 PM By: Kalman Shan DO Entered By: Kalman Shan on 07/12/2021 14:28:46 Presswood, [name] ([id number]) -------------------------------------------------------------------------------- Physical Exam Details Patient Name: GRACESON, STECKLEIN. Date of Service: 07/12/2021 1:15 PM Medical Record Number: [id number] Patient Account Number: [id number] Date of Birth/Sex: [birth year] (75 y.o. F) Treating RN: Donnamarie Poag Primary Care Provider: [name] Other Clinician: Referring Provider: [name] Treating Provider/Extender: Yaakov Guthrie in Treatment: 13 Constitutional . Cardiovascular . Psychiatric . Notes Left lower extremity: To the distal medial aspect there is an open wound with nonviable tissue and some scant granulation tissue present. Post debridement there was mostly granulation tissue present. Electronic Signature(s) Signed: 07/12/2021 3:10:56 PM By: Kalman Shan DO Entered By: Kalman Shan on 07/12/2021 14:29:45 Cajamarca, [name] ([id number]) -------------------------------------------------------------------------------- Physician Orders Details Patient Name: BENTLEIGH, FOGLE. Date of Service: 07/12/2021 1:15 PM Medical Record Number: [id number] Patient Account Number: [id number] Date  of Birth/Sex: 1946-02-08 (75 y.o. F) Treating RN: Donnamarie Poag Primary Care Provider: [name] Other Clinician: Referring Provider: [name] Treating Provider/Extender: Yaakov Guthrie in Treatment: 32 Verbal / Phone Orders: No Diagnosis Coding Follow-up Appointments Wound #13 Left,Distal,Posterior Lower Leg o Return Appointment in 1 week. o Nurse Visit as needed - nurse visit on Friday, 9/9 Bathing/ Shower/ Hygiene o May shower with wound dressing protected with water repellent  cover or cast protector. Anesthetic (Use 'Patient Medications' Section for Anesthetic Order Entry) o Lidocaine applied to wound bed Edema Control - Lymphedema / Segmental Compressive Device / Other o Optional: One layer of unna paste to top of compression wrap (to act as an anchor). o Patient to wear own compression stockings. Remove compression stockings every night before going to bed and put on every morning when getting up. - right leg o Elevate, Exercise Daily and Avoid Standing for Long Periods of Time. o Elevate legs to the level of the heart and pump ankles as often as possible o Elevate leg(s) parallel to the floor when sitting. o Compression Pump: Use compression pump on left lower extremity for 60 minutes, twice daily. - TWICE DAILY for 1 hour o Compression Pump: Use compression pump on right lower extremity for 60 minutes, twice daily. - TWICE DAILY for 1 hour Wound Treatment Wound #13 - Lower Leg Wound Laterality: Left, Posterior, Distal Cleanser: Soap and Water 2 x Per Week/30 Days Discharge Instructions: Gently cleanse wound with antibacterial soap, rinse and pat dry prior to dressing wounds Topical: Gentamicin 2 x Per Week/30 Days Discharge Instructions: to wound bed Apply as directed by provider. Topical: Triamcinolone Acetonide Cream, 0.1%, 15 (g) tube 2 x Per Week/30 Days Discharge Instructions: Peri wound Apply as directed by provider. Primary  Dressing: Prisma 4.34 (in) 2 x Per Week/30 Days Discharge Instructions: Moisten w/normal saline or sterile water; Cover wound as directed. Do not remove from wound bed. Secondary Dressing: ABD Pad 5x9 (in/in) 2 x Per Week/30 Days Discharge Instructions: Cover with ABD pad Compression Wrap: Medichoice 4 layer Compression System, 35-40 mmHG 2 x Per Week/30 Days Discharge Instructions: Apply multi-layer wrap as directed. Laboratory o Bacteria identified in Wound by Culture (MICRO) oooo LOINC Code: [id number] oooo Convenience Name: Wound culture routine Electronic Signature(s) Signed: 07/12/2021 3:10:56 PM By: Kalman Shan DO Signed: 07/12/2021 4:46:08 PM By: Donnamarie Poag Entered By: Donnamarie Poag on 07/12/2021 14:10:18 JENESSY, KUNZLER ([id number]) BERTINA, OGLE ([id number]) -------------------------------------------------------------------------------- Problem List Details Patient Name: MIDGIE, ACAMPORA. Date of Service: 07/12/2021 1:15 PM Medical Record Number: [id number] Patient Account Number: [id number] Date of Birth/Sex: [birth year] (75 y.o. F) Treating RN: Donnamarie Poag Primary Care Provider: [name] Other Clinician: Referring Provider: [name] Treating Provider/Extender: Yaakov Guthrie in Treatment: 13 Active Problems ICD-10 Encounter Code Description Active Date MDM Diagnosis I87.332 Chronic venous hypertension (idiopathic) with ulcer and inflammation of 04/12/2021 No Yes left lower extremity I89.0 Lymphedema, not elsewhere classified 04/12/2021 No Yes L97.221 Non-pressure chronic ulcer of left calf limited to breakdown of skin 04/12/2021 No Yes Inactive Problems Resolved Problems ICD-10 Code Description Active Date Resolved Date S81.801A Unspecified open wound, right lower leg, initial encounter 05/31/2021 05/31/2021 Electronic Signature(s) Signed: 07/12/2021 3:10:56 PM By: Kalman Shan DO Entered By: Kalman Shan on 07/12/2021 14:26:04 Peterkin, [name] -------------------------------------------------------------------------------- Progress Note/History and Physical Details Patient Name: ARBELLA, KROLIK. Date of Service: 07/12/2021 1:15 PM Medical Record Number: [id number] Patient Account Number: [id number] Date of Birth/Sex: [birth year] (75 y.o. F) Treating RN: Donnamarie Poag Primary Care Provider: [name] Other Clinician: Referring Provider: [name] Treating Provider/Extender: Yaakov Guthrie in Treatment: 13 Subjective Chief Complaint Information obtained from Patient Left calf venous stasis ulceration. 11/13/16; the patient is here for a left calf recurrent ulceration which is painful and has been present for the last month 01/22/17; the patient re-presents here for recurrent difficulties with blistering and leaking fluid on her left posterior calf 12/10/18;  the patient returns to clinic for a wound on the left medial calf 04/12/2021; left lower extremity wound, 05/31/21; right leg wound History of Present Illness (HPI) Pleasant 75 year old with history of chronic venous insufficiency. No diabetes or peripheral vascular disease. Left ABI 1.29. Questionable history of left lower extremity DVT. She developed a recurrent ulceration on her left lateral calf in December 2015, which she attributes to poor diet and subsequent lower extremity edema. She underwent endovenous laser ablation of her left greater saphenous vein in 2010. She underwent laser ablation of accessory branch of left GSV in April 2016 by Dr. Kellie Simmering at [facility]. She was previously wearing Unna boots, which she tolerated well. Tolerating 2 layer compression and cadexomer iodine. She returns to clinic for follow-up and is without new complaints. She denies any significant pain at this time. She reports persistent pain with pressure. No claudication or ischemic rest pain. No fever or chills. No drainage. READMISSION 11/13/16; this is a 75 year old  woman who is not a diabetic. She is here for a review of a painful area on her left medial lower extremity. I note that she was seen here previously last year for wound I believe to be in the same area. At that time she had undergone previously a left greater saphenous vein ablation by Dr. Kellie Simmering and she had a ablation of the anterior accessory branch of the left greater saphenous vein in March 2016. Seeing that the wound actually closed over. In reviewing the history with her today the ulcer in this area has been recurrent. She describes a biopsy of this area in 2009 that only showed stasis physiology. She also has a history of today malignant melanoma in the right shoulder for which she follows with Dr. 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 Do not see that she has ever had arterial studies in the left leg. She tells me she has a follow-up with Dr. Kellie Simmering in roughly 10 days In any case she developed the reopening of this area roughly a month ago. On the background of this she describes rapidly increasing edema which has responded to Lasix 40 mg and metolazone 2.5 mg as well as the patient's lymph massage. She has been told she has both venous insufficiency and lymphedema but she cannot tolerate compression stockings 11/28/16; the patient saw Dr. Kellie Simmering recently. Per the patient he did arterial Dopplers in the office that did not show evidence of arterial insufficiency, per the patient he stated "treat this like an ordinary venous ulcer". She also saw her dermatologist Dr. Ronnald Ramp who felt that this was more of a vascular ulcer. In general things are improving although she arrives today with increasing bilateral lower extremity edema with weeping a deeper fluid through the wound on the left medial leg compatible with some degree of lymphedema 12/04/16; the patient's wound is fully  epithelialized but I don't think fully healed. We will do another week of depression with Promogran and TCA however I suspect we'll be able to discharge her next week. This is a very unusual-looking wound which was initially a figure-of-eight type wound lying on its side surrounded by petechial like hemorrhage. She has had venous ablation on this side. She apparently does not have an arterial issue per Dr. Kellie Simmering. She saw her dermatologist thought it was "vascular". Patient is definitely going to need ongoing compression and I talked about  this with her today she will go to elastic therapy after she leaves here next week 12/11/16; the patient's wound is not completely closed today. She has surrounding scar tissue and in further discussion with the patient it would appear that she had ulcers in this area in 2009 for a prolonged period of time ultimately requiring a punch biopsy of this area that only showed venous insufficiency. I did not previously pickup on this part of the history from the patient. 12/18/16; the patient's wound is completely epithelialized. There is no open area here. She has significant bilateral venous insufficiency with secondary lymphedema to a mild-to-moderate degree she does not have compression stockings.. She did not say anything to me when I was in the room, she told our intake nurse that she was still having pain in this area. This isn't unusual recurrent small open area. She is going to go to elastic therapy to obtain compression stockings. 12/25/16; the patient's wound is fully epithelialized. There is no open area here. The patient describes some continued episodic discomfort in this area medial left calf. However everything looks fine and healed here. She is been to elastic therapy and caught herself 15-20 mmHg stockings, they apparently were having trouble getting 20-30 mm stockings in her size 01/22/17; this is a patient we discharged from the clinic a month ago. She has  a recurrent open wound on her medial left calf. She had 15 mm support stockings. I told her I thought she needed 20-30 mm compression stockings. She tells me that she has been ill with hospitalization secondary to asthma and is been found to have severe hypokalemia likely secondary to a combination of Lasix and metolazone. This morning she noted blistering and leaking fluid on the posterior part of her left leg. She called our intake nurse urgently and we was saw her this afternoon. She has not had any real discomfort here. I don't know that she's been wearing any stockings on this leg for at least 2-3 days. ABIs in this clinic were 1.21 on the right and 1.3 on the left. She is previously seen vascular surgery who does not think that there is a peripheral arterial issue. 01/30/17; Patient arrives with no open wound on the left leg. She has been to elastic therapy and obtained 20-34mhg below knee stockings and she has one on the right leg today. READMISSION 02/19/18; this PKanouseis a now 75year old patient we've had in this clinic perhaps 3 times before. I had last looked at her from January 07 December 2016 with an area on the medial left leg. We discharged her on 12/25/16 however she had to be readmitted on 01/22/17 with a recurrence. I have in my notes that we discharged her on 20-30 mm stockings although she tells me she was only wearing support hose because she cannot get stockings on predominantly related to her cervical spine surgery/issues. [place] with an anterior accessory branch ablation I think both of these were in 2016. On one of the previous visit she had a biopsy noted 2009 that was negative. She is not felt to have an arterial issue. She is not a diabetic. She does have a PQUINNLYN, PACIONE ([id number] history of obstructive sleep apnea hypertension asthma as well as chronic  venous insufficiency and lymphedema. On this occasion she noted 2 dry scaly patch on her left leg. She tried to put  lotion on this it didn't really help. There were 2 open areas.the patient has been seeing her primary physician from 02/05/18 through 02/14/18. She had Unna boots applied. The superior wound now on the lateral left leg has closed but she's had one wound that remains open on the lateral left leg. This is not the same spot as we dealt with in 2018. ABIs in this clinic were 1.3 bilaterally 02/26/18; patient has a small wound on the left lateral calf. Dimensions are down. She has chronic venous insufficiency and lymphedema. 03/05/18; small open area on the left lateral calf. Dimensions are down. Tightly adherent necrotic debris over the surface of the wound which was difficult to remove. Also the dressing [over collagen] stuck to the wound surface. This was removed with some difficulty as well. Change the primary dressing to Hydrofera Blue ready 03/12/18; small open area on the left lateral calf. Comes in with tightly adherent surface eschar as well as some adherent Hydrofera Blue. 03/19/18; open area on the left lateral calf. Again adherent surface eschar as well as some adherent Hydrofera Blue nonviable subcutaneous tissue. She complained of pain all week even with the reduction from 4-3 layer compression I put on last week. Also she had an increase in her ankle and calf measurements probably related to the same thing. 03/26/18; open area on the left lateral calf. A very small open area remains here. We used silver alginate starting last week as the Hydrofera Blue seem to stick to the wound bed. In using 4-layer compression 04/02/18; the open area in the left lateral calf at some adherent slough which I removed there is no open area here. We are able to transition her into her own compression stocking. Truthfully I think this is probably his support hose. However this does not maintain skin  integrity will be limited. She cannot put over the toe compression stockings on because of neck problems hand problems etc. She is allergic to the lining layer of juxta lites. We might be forced to use extremitease stocking should this fail READMIT 11/24/2018 Patient is now a 75 year old woman who is not a diabetic. She has been in this clinic on at least 3 previous occasions largely with recurrent wounds on her left leg secondary to chronic venous insufficiency with secondary lymphedema. Her situation is complicated by inability to get stockings on and an allergy to neoprene which is apparently a component and at least juxta lites and other stockings. As a result she really has not been wearing any stockings on her legs. She tells [place] that roughly 2 or 3 weeks ago she started noticing a stinging sensation just above her ankle on the left medial aspect. She has been diagnosed with pseudogout and she wondered whether this was what she was experiencing. She tried to dress this with something she bought at the store however subsequently it pulled skin off and now she has an open wound that is not improving. She has been using Vaseline gauze with a cover bandage. She saw her primary doctor last week who put an Haematologist on her. ABIs in this clinic was 1.03 on the left 2/12; the area is on the left medial ankle. Odd-looking wound with what looks to be surface epithelialization but a multitude of small petechial openings. This clearly not closed yet. We have been using silver alginate under 3 layer compression with TCA 2/19; the wound area did not look quite as good this week. Necrotic debris over the majority of the  wound surface which required debridement. She continues to have a multitude of what looked to be small petechial openings. She reminds [place] that she had a biopsy on this initially during her first outbreak in 2015 in [place] [facility]. She expresses concern about this being a possible  melanoma. 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 I assured her that this does not look anything like melanoma. Except for the petechial reaction it does look like a venous insufficiency area and she certainly has evidence of this on both sides 2/26; a difficult area on the left medial ankle. The patient clearly has chronic venous hypertension with some degree of lymphedema. The odd thing about the area is the small petechial hemorrhages. I am not really sure how to explain this. This was present last time and this is not a compression injury. We have been using Hydrofera Blue which I changed to last week 3/4; still using Hydrofera Blue. Aggressive debridement today. She does not have known arterial issues. She has seen Dr. Kellie Simmering at [facility] vein [facility] vascular and and has an ablation on the left. [Anterior accessory branch of the greater saphenous]. From what I remember they did not feel she had an arterial issue. The patient has had this area biopsied in 2009 at [facility] dermatology and by her recollection they said this was "stasis". She is also follow-up with dermatology locally who thought that this was more of a vascular issue 3/11; using Hydrofera Blue. Aggressive debridement today. She does not have an arterial issue. We are using 3 layer compression although we may need to go to 4. The patient has been in for multiple changes to her wrap since I last saw her a week ago. She says that the area was leaking. I do not have too much more information on what was found 01/19/19 on evaluation today patient was actually being seen for a nurse visit when unfortunately she had the area on her left lateral lower extremity as well as weeping from the right lower extremity that became apparent. Therefore we did end up actually seeing her for a full visit with myself. She is having some pain at this site as well  but fortunately nothing too significant at this point. No fevers, chills, nausea, or vomiting noted at this time. 3/18-Patient is back to the clinic with the left leg venous leg ulcer, the ulcer is larger in size, has a surface that is densely adherent with fibrinous tissue, the Hydrofera Blue was used but is densely adherent and there was difficulty in removing it. The right lower extremity was also wrapped for weeping edema. Patient has a new area over the left lateral foot above the malleolus that is small and appears to have no debris with intact surrounding skin. Patient is on increased dose of Lasix also as a means to edema management 3/25; the patient has a nonhealing venous ulcer on the medial left leg and last week developed a smaller area on the lateral left calf. We have been using Hydrofera Blue with a contact layer. 4/1; no major change in these wounds areas. Left medial and more recently left lateral calf. I tried Iodoflex last week to aid in debridement she did not tolerate this. She stated her pain was terrible all week. She took the top layer of the 4 layer compression off. 4/8; the patient actually looks somewhat better in terms of her more prominent left lateral  calf wound. There is some healthy looking tissue here. She is still complaining of a lot of discomfort. 4/15; patient in a lot of pain secondary to sciatica. She is on a prednisone taper prescribed by her primary physician. She has the 2 areas one on the left medial and more recently a smaller area on the left lateral calf. Both of these just above the malleoli 4/22; her back pain is better but she still states she is very uncomfortable and now feels she is intolerant to the [employer]. No real change in the wounds we have been using Sorbact. She has been previously intolerant to Iodoflex. There is not a lot of option about what we can use to debride this wound under compression that she no doubt needs. sHe states Ultram no  longer works for her pain 4/29; no major change in the wounds slightly increased depth. Surface on the original medial wound perhaps somewhat improved however the more recent area on the lateral left ankle is 100% covered in very adherent debris we have been using Sorbact. She tolerates 4 layer compression well and her edema control is a lot better. She has not had to come in for a nurse check 5/6; no major change in the condition of the wounds. She did consent to debridement today which was done with some difficulty. Continuing Sorbact. She did not tolerate Iodoflex. She was in for a check of her compression the day after we wrapped her last week this was adjusted but DENAIJA, MARALDO. ([id number]) nothing much was found 5/13; no major change in the condition or area of the wounds. I was able to get a fairly aggressive debridement done on the lateral left leg wound. Even using Sorbact under compression. She came back in on Friday to have the wrap changed. She says she felt uncomfortable on the lateral aspect of her ankle. She has a long history of chronic venous insufficiency including previous ablation surgery on this side. 5/20-Patient returns for wounds on left leg with both wounds covered in slough, with the lateral leg wound larger in size, she has been in 3 layer compression and felt more comfortable, she describes pain in ankle, in leg and pins and needles in foot, and is about to try Pamelor for this 6/3; wounds on the left lateral and left medial leg. The area medially which is the most recent of the 2 seems to have had the largest increase in dimensions. We have been using Sorbac to try and debride the surface. She has been to see orthopedics they apparently did a plain x-ray that was indeterminant. Diagnosed her with neuropathy and they have ordered an MRI to determine if there is underlying osteomyelitis. This was not high on my thought list but I suppose it is prudent. We have advised her  to make an appointment with vein and vascular in [place]. She has a history of a left greater saphenous and accessory vein ablations I wonder if there is anything else that can be done from a surgical point of view to help in these difficult refractory wounds. We have previously healed this wound on one occasion but it keeps on reopening [medial side] 6/10; deep tissue culture I did last week I think on the left medial wound showed both moderate E. coli and moderate staph aureus [MSSA]. She is going to require antibiotics and I have chosen Augmentin. We have been using Sorbact and we have made better looking wound surface on both sides but certainly  no improvement in wound area. She was back in last Friday apparently for a dressing changes the wrap was hurting her outer left ankle. She has not managed to get a hold of vein and vascular in [place]. We are going to have to make her that appointment 6/17; patient is tolerating the Augmentin. She had an MRI that I think was ordered by orthopedic surgeon this did not show osteomyelitis or an abscess did suggest cellulitis. We have been using Sorbact to the lateral and medial ankles. We have been trying to arrange a follow-up appointment with vein and vascular in [place] or did her original ablations. We apparently an area sent the request to vein and vascular in [facility] 6/24; patient has completed the Augmentin. We do not yet have a vein and vascular appointment in [place]. I am not sure what the issue is here we have asked her to call tomorrow. We are using Sorbact. Making some improvements and especially the medial wound. Both surfaces however look better medial and lateral. 7/1; the patient has been in contact with vein and vascular in [place] but has not yet received an appointment. Using Sorbact we have gradually improve the wound surface with no improvement in surface area. She is approved for Apligraf but the wound surface still is  not completely viable. She has not had to come in for a dressing change 7/8; the patient has an appointment with vein and vascular on 7/31 which is a Friday afternoon. She is concerned about getting back here for [place] to dress her wounds. I think it is important to have them goal for her venous reflux/history of ablations etc. to see if anything else can be done. She apparently tested positive for 1 of the blood tests with regards to lupus and saw a rheumatologist. He has raised the issue of vasculitis again. I have had this thought in the past however the evidence seems overwhelming that this is a venous reflux etiology. If the rheumatologist tells me there is clinical and laboratory investigation is positive for lupus I will rethink this. 7/15; the patient's wound surfaces are quite a bit better. The medial area which was her original wound now has no depth although the lateral wound which was the more recent area actually appears larger. Both with viable surfaces which is indeed better. Using Sorbact. I wanted to use Apligraf on her however there is the issue of the vein and vascular appointment on 7/31 at 2:00 in the afternoon which would not allow her to get back to be rewrapped and they would no doubt remove the graft 7/22; the patient's wound surfaces have moderate amount of debris although generally look better. The lateral one is larger with 2 small satellite areas superiorly. We are waiting for her vein and vascular appointment on 7/31. She has been approved for Apligraf which I would like to use after th 7/29; wound surfaces have improved no debridement is required we have been using Sorbact. She sees vein and vascular on Friday with this so question of whether anything can be done to lessen the likelihood of recurrence and/or speed the healing of these areas. She is already had previous ablations. She no doubt has severe venous hypertension 8/5-Patient returns at 1 week, she was in [employer] for 3 days by her podiatrist, we have been using so backed to the wound, she has increased pain in both the wounds on the left lower leg especially the more distal one on the lateral aspect 8/12-Patient  returns at 1 week and she is agreeable to having debridement in both wounds on her left leg today. We have been using Sorbact, and vascular studies were reviewed at last visit 8/19; the patient arrives with her wounds fairly clean and no debridement is required. We have used Sorbact which is really done a nice job in cleaning up these very difficult wound surfaces. The patient saw Dr. Donzetta Matters of vascular surgery on 7/31. He did not feel that there was an arterial component. He felt that her treated greater saphenous vein is adequately addressed and that the small saphenous vein did not appear to be involved significantly. She was also noted to have deep venous reflux which is not treatable. Dr. Donzetta Matters mentioned the possibility of a central obstructive component leading to reflux and he offered her central venography. She wanted to discuss this or think about it. I have urged her to go ahead with this. She has had recurrent difficult wounds in these areas which do heal but after months in the clinic. If there is anything that can be done to reduce the likelihood of this I think it is worth it. 9/2 she is still working towards getting follow-up with Dr. Donzetta Matters to schedule her CT. Things are quite a bit worse venography. I put Apligraf on 2 weeks ago on both wounds on the medial and lateral part of her left lower leg. She arrives in clinic today with 3 superficial additional wounds above the area laterally and one below the wound medially. She describes a lot of discomfort. I think these are probably wrapped injuries. Does not look like she has cellulitis. 07/20/2019 on evaluation today patient appears to be doing somewhat poorly in regard to her lower extremity ulcers. She in fact showed signs of erythema  in fact we may even be dealing with an infection at this time. Unfortunately I am unsure if this is just infection or if indeed there may be some allergic reaction that occurred as a result of the Apligraf application. With that being said that would be unusual but nonetheless not impossible in this patient is one who is unfortunately allergic to quite a bit. Currently we have been using the Sorbact which seems to do as well as anything for her. I do think we may want to obtain a culture today to see if there is anything showing up there that may need to be addressed. 9/16; noted that last week the wounds look worse in 1 week follow-up of the Apligraf. Using Sorbact as of 2 days ago. She arrives with copious amounts of drainage and new skin breakdown on the back of the left calf. The wounds arm more substantial bilaterally. There is a fair amount of swelling in the left calf no overt DVT there is edema present I think in the left greater than right thigh. She is supposed to go on 9/28 for CT venography. The wounds on the medial and lateral calf are worse and she has new skin breakdown posteriorly at least new for me. This is almost developing into a circumferential wound area The Apligraf was taken off last week which I agree with things are not going in the right direction a culture was done we do not have that back yet. She is on Augmentin that she started 2 days ago 9/23; dressing was changed by her nurses on Monday. In general there is no improvement in the wound areas although the area looks less angry than last week. She did get  Augmentin for MSSA cultured on the 14th. She still appears to have too much swelling in the left leg even with 3 layer compression 9/30; the patient underwent her procedure on 9/28 by Dr. Donzetta Matters at vascular and vein specialist. She was discovered to have the common iliac vein measuring 12.2 mm but at the level of L4-L5 measured 3 mm. After stenting it measured 10 mm. 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 Rouleaux flow in the common femoral and femoral vein was observed much improved after stenting. We are using silver alginate to the wounds on the medial and lateral ankle on the left. 4 layer compression DOMINEQUE, BURGI ([id number]) 10/7; the patient had fluid swelling around her knee and 4 layer compression. At the advice of vein and vascular this was reduced to 3 layer which she is tolerating better. We have been using silver alginate under 3 layer compression since last Friday 10/14; arrives with the areas on the left ankle looking a lot better. Inflammation in the area also a lot better. She came in for a nurse check on 10/9 10/21; continued nice improvement. Slight improvements in surface area of both the medial and lateral wounds on the left. A lot of the satellite lesions in the weeping erythema around these from stasis dermatitis is resolved. We have been using silver alginate 10/28; general improvement in the entire wound areas although not a lot of change in dimensions the wound certainly looks better. There is a lot less in terms of venous inflammation. Continue silver alginate this week however look towards Hydrofera Blue next week 11/4; very adherent debris on the medial wound left wound is not as bad. We have been using silver alginate. Change to [employer] today 11/11; very adherent debris on both wound areas. She went to vein and vascular last week and follow-up they put in [place] boot on this today. He says the [facility] was adherent. Wound is definitely not as good as last week. Especially on the left there the satellite lesions look more prominent 11/18; absolutely no better. erythema on lateral aspect with tenderness. 09/30/2019 on evaluation today patient appears to actually be doing better. Dr. Dellia Nims did put her on doxycycline last week which I do believe has helped her at this point. Fortunately there is no signs  of active infection at this time. No fevers, chills, nausea, vomiting, or diarrhea. I do believe he may want extend the doxycycline for 7 additional days just to ensure everything does completely cleared up the patient is in agreement with that plan. Otherwise she is going require some sharp debridement today 12/2; patient is completing a 2-week course of doxycycline. I gave her this empirically for inflammation as well as infection when I last saw her 2 weeks ago. All of this seems to be better. She is using silver alginate she has the area on the medial aspect of the larger area laterally and the 2 small satellite regions laterally above the major wound. 12/9; the patient's wound on the left medial and left lateral calf look really quite good. We have been using silver alginate. She saw vein and vascular in follow-up on 10/09/2019. She has had a previous left greater saphenous vein ablation by Dr. Oscar La in 2016. More recently she underwent a left common iliac vein stent by Dr. 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 The swelling is improved and certainly the wounds have improved. The patient shows [place] today area on the  right medial calf there is almost no wound but leaking lymphedema. She says she start this started 3 or 4 days ago. She did not traumatize it. It is not painful. She does not wear compression on that side 12/16; the patient continues to do well laterally. Medially still requiring debridement. The area on the right calf did not materialize to anything and is not currently open. We wrapped this last time. She has support stockings for that leg although I am not sure they are going to provide adequate compression 12/23; the lateral wound looks stable. Medially still requiring debridement for tightly adherent fibrinous debris. We've been using silver alginate. Surface area not any different 12/30; neither wound is any better with regards to surface and the area on the left  lateral is larger. I been using silver alginate to the left lateral which look quite good last week and Sorbact to the left medial 11/11/2019. Lateral wound area actually looks better and somewhat smaller. Medial still requires a very aggressive debridement today. We have been using Sorbact on both wound areas 1/13; not much better still adherent debris bilaterally. I been using Sorbact. She has severe venous hypertension. Probably some degree of dermal fibrosis distally. I wonder whether tighter compression might help and I am going to try that today. We also need to work on the bioburden 1/20; using Sorbact. She has severe venous hypertension status post stent placement for pelvic vein compression. We applied gentamicin last time to see if we could reduce bioburden I had some discussion with her today about the use of pentoxifylline. This is occasionally used in this setting for wounds with refractory venous insufficiency. However this interacts with Plavix. She tells me that she was put on this after stent placement for 3 months. She will call Dr. Claretha Cooper office to discuss 1/27; we are using gentamicin under Sorbact. 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 She has a stent. Wound medially is measuring smaller this week. Laterally measuring slightly larger although she has some satellite lesions superiorly 2/3; gentamicin under Sorbact under 4-layer compression. 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 She has a stent on Plavix. Her wounds are measuring smaller this week. More substantially laterally where there is a satellite lesion superiorly. 2/10; gentamicin under Sorbac. 4-layer compression. Patient communicated with Dr. Donzetta Matters at vein and vascular in [place]. He is okay with the patient coming off Plavix I will therefore start her on pentoxifylline for a 1 month trial. In general her wounds look better today. I had some concerns about  swelling in the left thigh however she measures 61.5 on the right and 63 on the mid thigh which does not suggest there is any difficulty. The patient is not describing any pain. 2/17; gentamicin under Sorbac 4-layer compression. She has been on pentoxifylline for 1 week and complains of loose stool. No nausea she is eating and drinking well 2/24; the patient apparently came in 2 days ago for a nurse visit when her wrap fell down. Both areas look a little worse this week macerated medially and satellite lesions laterally. Change to silver alginate today 3/3; wounds are larger today especially medially. She also has more swelling in her foot lower leg and I even noted some swelling in her posterior thigh which is tender. I wonder about the patency of her stent. Fortuitously she sees Dr. Claretha Cooper group on Friday 3/10; Mrs. Cassella was seen by vein and vascular on 3/5. The patient underwent ultrasound. There was  no evidence of thrombosis involving the IVC no evidence of thrombosis involving the right common iliac vein there is no evidence of thrombosis involving the right external iliac vein the left external vein is also patent. The right common iliac vein stent appears patent bilateral common femoral veins are compressible and appear patent. I was concerned about the left common iliac stent however it looks like this is functional. She has some edema in the posterior thigh that was tender she still has that this week. I also note they had trouble finding the pulses in her left foot and booked her for an ABI baseline in 4 weeks. She will follow up in 6 months for repeat IVC duplex. The patient stopped the pentoxifylline because of diarrhea. It does not look like that was being effective in any case. I have advised her to go back on her aspirin 81 mg tablet, vascular it also suggested this 3/17; comes in today with her wound surfaces a lot better. The excoriations from last week considerably better  probably secondary to the TCA. We have been using silver alginate 3/24; comes in today with smaller wounds both medially and laterally. Both required debridement. There are 2 small satellite areas superiorly laterally. She also has a very odd bandlike area in the mid calf almost looking like there was a weakness in the wrap in a localized area. I would write this off as being this however anteriorly she has a small raised ballotable area that is very tender almost reminiscent of an abscess but there was no DELLER, LIPPENS. ([id number]) obvious purulent surface to it. 02/04/20 upon evaluation today patient appears to be doing fairly well in regard to her wounds today. Fortunately there is no signs of active infection at this time. No fevers, chills, nausea, vomiting, or diarrhea. She has been tolerating the dressing changes without complication. Fortunately I feel like she is showing signs of improvement although has been sometime since have seen her. Nonetheless the area of concern that Dr. Dellia Nims had last week where she had possibly an area of the wrap that was we can allow the leg to bulge appears to be doing significantly better today there is no signs of anything worsening. 4/7; the patient's wounds on her medial and lateral left leg continue to contract. We have been using a regular alginate. Last week she developed an area on the right medial lower leg which is probably a venous ulcer as well. 4/14; the wounds on her left medial and lateral lower leg continue to contract. Surface eschar. We have been using regular alginate. The area on the right medial lower leg is closed. We have been putting both legs under 4-layer contraction. The patient went back to see vein and vascular she had arterial studies done which were apparently "quite good" per the patient although I have not read their notes I have never felt she had an arterial issue. 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 This is been longstanding and refractory to exercise, leg elevation and longstanding use of compression wraps in our clinic as well as compression stockings on the times we have been able to get these to heal 4/21; we thought she actually might be close this week however she arrives in clinic with a lot of edema in her upper left calf and into her posterior thigh. This is been an intermittent problem here. She says the wrap fell down but it was replaced with a nurse visit on  Monday. We are using calcium alginate to the wounds and the wound sizes there not terribly larger than last week but there is a lot more edema 4/28; again wound edges are smaller on both sides. Her edema is better controlled than last time. She is obtained her compression pumps from [employer] although they have not been to her home to set these up. 5/5; left medial and left lateral both look stable. I am not sure the medial is any smaller. We have been using calcium alginate under 4-layer compression. She had an area on the right medial. This was eschared today. We have been wrapping this as well. She does not tolerate external compression stockings due to a history of various contact allergies. She has her compression pumps however the representative from the company is coming on her to show her how to use these tomorrow 5/19; patient with severe chronic venous insufficiency secondary to central venous disease. She had a stent placed in her left common iliac vein. She has done better since but still difficult to control wounds. She comes in today with nothing open on the right leg. Her areas on the left medial and left lateral are just about closed. We are using calcium alginate under 4-layer compression. She is using her external compression pumps at home She only has 15-20 support stockings. States she cannot get anything tighter than that on. 03/30/20-Patient returns at 1 week, the  wounds on the left leg are both slightly bigger, the last week she was on 3 layer compression which started to slide down. She is starting to use her lymphedema pumps although she stated on 1 day her right ankle started to swell up and she have to stop that day. Unfortunately the open area seem to oscillate between improving to the point of healing and then flaring up all to do with effectiveness of compression or lack of due to the left leg topography not keeping the compression wraps from rolling down 6/2 patient comes in with a 15/20 mmHg stocking on the right leg. She tells me that she developed a lot of swelling in her ankles she saw orthopedics she was felt to possibly be having a flare of pseudogout versus some other type of arthritis. She was put on steroids for a respiratory issue so that helps with the inflammation. She has not been using the pumps all week. She thinks the left thigh is more swollen than usual and I would agree with that. She has an appointment with Dr. Donzetta Matters 9 days or so from now 6/9; both wounds on the left medial and left lateral are smaller. We have been using calcium alginate under compression. She does not have an open wound on the right leg she is using a stocking and her compression pumps things are going well. She has an appointment with Dr. Donzetta Matters with regards to her stent in the left common iliac vein 6/16; the wounds on the left medial and left lateral ankle continues to contract. The patient saw Dr. Donzetta Matters and I think he seems satisfied. Ordered follow-up venous reflux studies on both sides in September. Cautioned that she may need thigh-high stockings. She has been using calcium alginate under compression on the left and her own stocking on the right leg. She tells [place] there are no open wounds on the right 6/23; left lateral is just about closed. Medial required debridement today. We have been using calcium alginate. Extensive discussion about the compression pumps  she is only using  these on 25 mmHg states she could not take 40 or 30 when the wrap came out to her home to demonstrate these. He said they should not feel tight 6/30; the left lateral wound has a slight amount of eschar. . The area medially is about the same using Hydrofera Blue. 7/7; left lateral wound still has some eschar. I will remove this next week may be closed. The area medially is very small using Hydrofera Blue with improvement. Unfortunately the stockings fell down. Unfortunately the blisters have developed at the edge of where the wrap fell. When this happened she says her legs hurt she did not use her pumps. We are not open Monday for her to come in and change the wraps and she had an appointment yesterday. She also tells me that she is going to have an MRI of her back. She is having pain radiating into her left anterior leg she thinks her from an L5 disc. She saw Dr. Ellene Route of neurosurgery 7/14; the area on the left lateral ankle area is closed. Still a small area medially however it looks better as well. We have been using Hydrofera Blue under 4-layer compression 7/21; left lateral ankle is still closed however her wound on the medial left calf is actually larger. This is probably because Hydrofera Blue got stuck to the wound. She came in for a nurse change on Friday and will do that again this week I was concerned about the amount of swelling that she had last week however she is using her compression pumps twice a day and the swelling seems well controlled 7/28; remaining wound on the left medial lower leg is smaller. We have been using moistened silver collagen under compression she is coming back for a nurse visit. For reasons that were not really clear she was just keeping her legs elevated and not using her compression pumps. I have asked her to use the compression pumps. She does not have any wounds on the right leg 06/15/20-Patient returns at 2 weeks, her LLE edema is worse  and she developed a blister wound that is new and has bigger posterior calf wound on right, we are using Prisma with pad, 4 layer compression. she has been on lasix 40 mg daily 8/18; patient arrives today with things a lot worse than I remember from a few weeks ago. She was seen last week. Noted that her edema was worse and that she now had a left lateral wound as well as deteriorating edema in the medial and posterior part of the lower leg. She says she is using his or her external compression pumps once a day although I wonder about the compliance. 8/25; weeping area on the right medial lower leg. This had actually gotten a small localized area of her compression stocking wet. On the left side there is a large denuded area on the posterior medial lower leg and smaller area on the lateral. This was not the original areas that we dealt with. 9/1 the patient's wound on the left leg include the left lateral and left posterior. Larger superficial wounds weeping. She has very poor edema control. Tender localized edema in the left lower medial ankle/heel probably because of localized wrap issues. She freely admits she is not using Huy, Dyer ([id number]) the compression pumps. She has been up on her feet a lot. She thinks the hydrofera blue is contributing to the pain she is experiencing.. This is a complaint that I have occasionally heard 9/8; really  not much improvement. The patient is still complaining of a lot of pain particularly when she uses compression pumps. I switched her to silver alginate last time because she found the Hydrofera Blue to be irritating. I don't hear much difference in her description with the silver alginate. 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 She has follow-up with Dr. Donzetta Matters I think for ultrasound next month 9/15; quite a bit of improvement today. We have less edema and more  epithelialization in both of her wound areas on the left medial and left lateral calf. These are not the site of her original wounds in this area. She says she has been using her compression pumps for 30 minutes twice a day, there pain issues that never quite understood. Silver alginate as the primary dressing 9/22; continued improvement. Both areas medially and laterally still have a small open area there is some eschar. She continues to complain of left medial ankle pain. Swelling in the leg is in much better condition. We have been using silver alginate 9/29; continued improvement. Both areas medially and laterally in the left calf look as though they are close some minor surface eschar but I think this is epithelialized. She comes in today saying she has a ruptured disc at L4-L5 cannot bend over to put on her stockings. 10/6; patient comes in today with no open wounds on either leg. However her edema on the left leg in the upper one third of the lower leg is poorly controlled nonpitting. She says that she could not use the pumps for 2 days and then she has been using the last couple of days. It is not clear to me she has been able to get her stocking on. She has back problems. Mrs. Grumbling has severe chronic venous insufficiency with secondary lymphedema. Her venous insufficiency is partially centrally mediated and that she is now post stent in the left common iliac vein. [facility] Thurner's syndrome/physiology]. She follows up with them on 10/15. She wears 20/30 below-knee stockings. She is supposed to use compression pumps at home although I think her compliance about with this is been less than 100%. I have asked her to use these 3 times a day. Finally I think she has lipodermatosclerosis in the left lower leg with an inverted bottle sign. It is been a major problem controlling the edema in the left leg. The right leg we have had wounds on but not as significant a problem is on the  left READMISSION 04/12/2021 Mrs. Quance is a 75 year old woman we know well in this clinic. She has severe chronic venous insufficiency. 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 I believe she has had bilateral greater saphenous vein ablation in the past as well. She tells me that this wound opened sometime in March. She had a fall and thinks it was initially abrasion. She developed areas she describes as little blisters on the anterior part of her leg and she saw dermatology and was treated for methicillin staph aureus with several rounds of antibiotics. She has been using support stockings on the left leg and says this is the only thing she can get on. Her compression pump use maybe once a day she says if she did not use one she use the other. She comes in today with incredible swelling in the left leg with a wound on  the left posterior calf. She has been using Neosporin to this previously a hydrocolloid. 6/15; patient arrives back for 1 week follow-up.Marland Kitchen Apparently her wrap fell down she did not call us to replace this. He has poor edema control. She only uses her compression pumps once a day 6/29; patient presents for 1 week follow-up. She has tolerated the compression wrap well. We have been using Iodoflex under the wrap. She has no issues or complaints today. 7/6; patient presents for 1 week follow-up. She states that the compression wrap rolled down her leg 4 days ago. She has been trying to keep the area covered but has no dressings at home to use. She denies signs of infection. 7/13; patient presents for 1 week follow-up. She states that her compression wrap rolled down her right leg and she called our office and had it placed a few days ago. She has been tolerating the current wrap well. She states that the Iodoflex is causing a burning sensation. She denies signs of infection. 7/27; patient presents for 1 week follow-up She has tolerated the  compression wrap well with Hydrofera Blue underneath. She has now developed a wound to her right lower extremity. She reports having a culture done To this area by her dermatologist that she reports is negative. She currently denies signs of infection. 8/30; patient presents for 1 week follow-up. On the left side she has tolerated the compression wrap well with Hydrofera Blue underneath. She reports some discomfort to her right lower extremity with the 3 layer compression. She currently denies signs of infection. 06/14/2021 upon evaluation today patient's wound actually appears to be doing decently well based on what I am seeing currently. She actually has 2 areas 1 on the left distal/posterior lower leg and the other on the right lower leg. Subsequently the measurements are roughly the same may be slightly smaller but in general have not made any trend towards getting worse which is great news. 06/23/2021 upon evaluation today patient appears to be doing pretty well in regard to her wounds. On the right she is having difficulty with sciatic pain and this subsequently has led to her taking the wrap off over the last week her leg is more swollen she is also been on prednisone for 10 days. With that being said I think we may want to just use a compression sock on the left that way she will not have to worry about the compression wrap being in place that she cannot get off. With that being said I do believe as well that the patient is going to need to continue with the wrapping on the left we will also need to do a little bit of sharp debridement today. 8/24; patient presents for 1 week follow-up. She has no issues or complaints today. She denies signs of infection. 8/31; patient presents for 1 week follow-up. She has no issues or complaints today. She has tolerated the compression wrap well on the left lower extremity. She denies signs of infection. 9/7; patient presents for follow-up. She reports that  the compression wrap rolled down slightly on her right lower extremity. She reports tenderness to the wound bed. She denies increased warmth or erythema to the surrounding skin. She overall feels well. Patient History Information obtained from Patient. Family History Cancer - Mother,Paternal Grandparents, Hypertension - Mother,Father, Kidney Disease - Paternal Grandparents, Lung Disease - Paternal Grandparents, No family history of Diabetes, Heart Disease, Hereditary Spherocytosis, Seizures, Stroke, Thyroid Problems, Tuberculosis. IVA, BJORKMAN ([id number]) Social  History Former smoker - quit at age 64 - ended on 04/19/1966, Marital Status - Divorced, Alcohol Use - Never, Drug Use - No History, Caffeine Use - Daily. Medical History Eyes Patient has history of Cataracts Denies history of Glaucoma, Optic Neuritis Ear/Nose/Mouth/Throat Denies history of Chronic sinus problems/congestion, Middle ear problems Hematologic/Lymphatic Denies history of Anemia, Hemophilia, Human Immunodeficiency Virus, Lymphedema, Sickle Cell Disease Respiratory Patient has history of Asthma, Sleep Apnea Denies history of Aspiration, Chronic Obstructive Pulmonary Disease (COPD), Pneumothorax, Tuberculosis Cardiovascular Patient has history of Deep Vein Thrombosis - LLE 2010, Hypertension, Peripheral Venous Disease Denies history of Angina, Arrhythmia, Congestive Heart Failure, Coronary Artery Disease, Hypotension, Myocardial Infarction, Peripheral Arterial Disease, Phlebitis, Vasculitis Gastrointestinal Denies history of Cirrhosis , Colitis, Crohn s, Hepatitis A, Hepatitis B, Hepatitis C Endocrine Denies history of Type I Diabetes, Type II Diabetes Genitourinary Denies history of End Stage Renal Disease Immunological Denies history of Lupus Erythematosus, Raynaud s, Scleroderma Integumentary (Skin) Denies history of History of Burn, History of pressure wounds Musculoskeletal Patient has history of  Osteoarthritis - neck Denies history of Gout, Rheumatoid Arthritis, Osteomyelitis Neurologic Denies history of Dementia, Neuropathy, Quadriplegia, Paraplegia, Seizure Disorder Oncologic Patient has history of Received Chemotherapy - interfeon immunotherapy, Received Radiation Psychiatric Denies history of Anorexia/bulimia, Confinement Anxiety Hospitalization/Surgery History - Melanoma left shoulder. Medical And Surgical History Notes Constitutional Symptoms (General Health) Vein ablation LLE 2010 Vascular Sx ( vein ablationo) LLE 02/2015 Eyes Horners syndrome Genitourinary Kidney stones Neurologic ct scan showed swollen lymph nodes Oncologic Melanoma (R) shoulder 07/2010; (R) axillary lymphnode removal Objective Constitutional Vitals Time Taken: 1:20 PM, Height: 63 in, Weight: 212 lbs, BMI: 37.6, Temperature: 98.8 F, Pulse: 70 bpm, Respiratory Rate: 16 breaths/min, Blood Pressure: 139/70 mmHg. General Notes: Left lower extremity: To the distal medial aspect there is an open wound with nonviable tissue and some scant granulation tissue present. Post debridement there was mostly granulation tissue present. Integumentary (Hair, Skin) Wound #13 status is Open. Original cause of wound was Gradually Appeared. The date acquired was: 02/24/2021. The wound has been in treatment 13 weeks. The wound is located on the Left,Distal,Posterior Lower Leg. The wound measures 1.6cm length x 1cm width x 0.2cm depth; 1.257cm^2 area and 0.251cm^3 volume. There is Fat Layer (Subcutaneous Tissue) exposed. There is no tunneling or undermining noted. There DENNYS, FRISINGER ([id number]) is a medium amount of serosanguineous drainage noted. There is medium (34-66%) red, pink granulation within the wound bed. There is a medium (34-66%) amount of necrotic tissue within the wound bed including Adherent Slough. Assessment Active Problems ICD-10 Chronic venous hypertension (idiopathic) with ulcer and inflammation  of left lower extremity Lymphedema, not elsewhere classified Non-pressure chronic ulcer of left calf limited to breakdown of skin Patient's wound has shown improvement in size since last clinic visit. The appearance however was concerning for possible infection. The surrounding soft tissue is intact without signs of cellulitis. I did obtain a wound culture of the wound bed. I debrided nonviable tissue and the appearance had improved. I recommend continuing collagen under compression And adding gentamicin. She will have a nurse visit at the end of the week for wrap change and assure the wound is stable. If wound improves we will likely do PuraPly next week. Procedures Wound #13 Pre-procedure diagnosis of Wound #13 is a Venous Leg Ulcer located on the Left,Distal,Posterior Lower Leg .Severity of Tissue Pre Debridement is: Fat layer exposed. There was a Excisional Skin/Subcutaneous Tissue Debridement with a total area of 1.6 sq cm performed by Heber [name],  Janett Billow, MD. With the following instrument(s): Curette to remove Viable and Non-Viable tissue/material. Material removed includes Subcutaneous Tissue, Slough, and Biofilm after achieving pain control using Lidocaine. 1 specimen was taken by a Tissue Culture and sent to the lab per facility protocol. A time out was conducted at 13:59, prior to the start of the procedure. A Minimum amount of bleeding was controlled with Pressure. The procedure was tolerated well. Post Debridement Measurements: 1.6cm length x 1cm width x 0.2cm depth; 0.251cm^3 volume. Character of Wound/Ulcer Post Debridement is improved. Severity of Tissue Post Debridement is: Fat layer exposed. Post procedure Diagnosis Wound #13: Same as Pre-Procedure Pre-procedure diagnosis of Wound #13 is a Venous Leg Ulcer located on the Left,Distal,Posterior Lower Leg . There was a Three Layer Compression Therapy Procedure by Donnamarie Poag, RN. Post procedure Diagnosis Wound #13: Same as  Pre-Procedure Plan Follow-up Appointments: Wound #13 Left,Distal,Posterior Lower Leg: Return Appointment in 1 week. Nurse Visit as needed - nurse visit on Friday, 9/9 Bathing/ Shower/ Hygiene: May shower with wound dressing protected with water repellent cover or cast protector. Anesthetic (Use 'Patient Medications' Section for Anesthetic Order Entry): Lidocaine applied to wound bed Edema Control - Lymphedema / Segmental Compressive Device / Other: Optional: One layer of unna paste to top of compression wrap (to act as an anchor). Patient to wear own compression stockings. Remove compression stockings every night before going to bed and put on every morning when getting up. - right leg Elevate, Exercise Daily and Avoid Standing for Long Periods of Time. Elevate legs to the level of the heart and pump ankles as often as possible Elevate leg(s) parallel to the floor when sitting. Compression Pump: Use compression pump on left lower extremity for 60 minutes, twice daily. - TWICE DAILY for 1 hour Compression Pump: Use compression pump on right lower extremity for 60 minutes, twice daily. - TWICE DAILY for 1 hour Laboratory ordered were: Wound culture routine WOUND #13: - Lower Leg Wound Laterality: Left, Posterior, Distal Cleanser: Soap and Water 2 x Per Week/30 Days Discharge Instructions: Gently cleanse wound with antibacterial soap, rinse and pat dry prior to dressing wounds Topical: Gentamicin 2 x Per Week/30 Days Discharge Instructions: to wound bed Apply as directed by provider. Topical: Triamcinolone Acetonide Cream, 0.1%, 15 (g) tube 2 x Per Week/30 Days XYLIA, KARPOWICH ([id number]) Discharge Instructions: Peri wound Apply as directed by provider. Primary Dressing: Prisma 4.34 (in) 2 x Per Week/30 Days Discharge Instructions: Moisten w/normal saline or sterile water; Cover wound as directed. Do not remove from wound bed. Secondary Dressing: ABD Pad 5x9 (in/in) 2 x Per Week/30  Days Discharge Instructions: Cover with ABD pad Compression Wrap: Medichoice 4 layer Compression System, 35-40 mmHG 2 x Per Week/30 Days Discharge Instructions: Apply multi-layer wrap as directed. 1. In office sharp debridement 2. Wound culture 3. Continue collagen under compression wrap 4. Nurse visit on Friday for wrap change 5. Follow-up with me in 1 week Electronic Signature(s) Signed: 07/12/2021 3:10:56 PM By: Kalman Shan DO Entered By: Kalman Shan on 07/12/2021 14:32:53 Milazzo, [name] -------------------------------------------------------------------------------- ROS/PFSH Details Patient Name: JAKALYN, RENNA. Date of Service: 07/12/2021 1:15 PM Medical Record Number: [id number] Patient Account Number: [id number] Date of Birth/Sex: 03-Jun-1946 (75 y.o. F) Treating RN: Donnamarie Poag Primary Care Provider: [name] Other Clinician: Referring Provider: [name] Treating Provider/Extender: Yaakov Guthrie in Treatment: 13 Label Progress Note Print Version as History and Physical for this encounter Information Obtained From Patient Constitutional Symptoms (General Health) Medical History:  Past Medical History Notes: Vein ablation LLE 2010 Vascular Sx ( vein ablationo) LLE 02/2015 Eyes Medical History: Positive for: Cataracts Negative for: Glaucoma; Optic Neuritis Past Medical History Notes: Horners syndrome Ear/Nose/Mouth/Throat Medical History: Negative for: Chronic sinus problems/congestion; Middle ear problems Hematologic/Lymphatic Medical History: Negative for: Anemia; Hemophilia; Human Immunodeficiency Virus; Lymphedema; Sickle Cell Disease Respiratory Medical History: Positive for: Asthma; Sleep Apnea Negative for: Aspiration; Chronic Obstructive Pulmonary Disease (COPD); Pneumothorax; Tuberculosis Cardiovascular Medical History: Positive for: Deep Vein Thrombosis - LLE 2010; Hypertension; Peripheral Venous  Disease Negative for: Angina; Arrhythmia; Congestive Heart Failure; Coronary Artery Disease; Hypotension; Myocardial Infarction; Peripheral Arterial Disease; Phlebitis; Vasculitis Gastrointestinal Medical History: Negative for: Cirrhosis ; Colitis; Crohnos; Hepatitis A; Hepatitis B; Hepatitis C Endocrine Medical History: Negative for: Type I Diabetes; Type II Diabetes Genitourinary Medical History: Negative for: End Stage Renal Disease Past Medical History Notes: Kidney stones BAILEE, MARTINE ([id number]) Immunological Medical History: Negative for: Lupus Erythematosus; Raynaudos; Scleroderma Integumentary (Skin) Medical History: Negative for: History of Burn; History of pressure wounds Musculoskeletal Medical History: Positive for: Osteoarthritis - neck Negative for: Gout; Rheumatoid Arthritis; Osteomyelitis Neurologic Medical History: Negative for: Dementia; Neuropathy; Quadriplegia; Paraplegia; Seizure Disorder Past Medical History Notes: ct scan showed swollen lymph nodes Oncologic Medical History: Positive for: Received Chemotherapy - interfeon immunotherapy; Received Radiation Past Medical History Notes: Melanoma (R) shoulder 07/2010; (R) axillary lymphnode removal Psychiatric Medical History: Negative for: Anorexia/bulimia; Confinement Anxiety HBO Extended History Items Eyes: Cataracts Immunizations Pneumococcal Vaccine: Received Pneumococcal Vaccination: Yes Received Pneumococcal Vaccination On or After 60th Birthday: No Immunization Notes: tetanus shot w/in the last 5 years per pt Implantable Devices None Hospitalization / Surgery History Type of Hospitalization/Surgery Melanoma left shoulder Family and Social History Cancer: Yes - Mother,Paternal Grandparents; Diabetes: No; Heart Disease: No; Hereditary Spherocytosis: No; Hypertension: Yes - Mother,Father; Kidney Disease: Yes - Paternal Grandparents; Lung Disease: Yes - Paternal Grandparents; Seizures:  No; Stroke: No; Thyroid Problems: No; Tuberculosis: No; Former smoker - quit at age 24 - ended on 04/19/1966; Marital Status - Divorced; Alcohol Use: Never; Drug Use: No History; Caffeine Use: Daily; Financial Concerns: No; Food, Clothing or Shelter Needs: No; Support System Lacking: No; Transportation Concerns: No Electronic Signature(s) Signed: 07/12/2021 3:10:56 PM By: Kalman Shan DO Signed: 07/12/2021 4:46:08 PM By: Donnamarie Poag Entered By: Kalman Shan on 07/12/2021 14:29:03 Daywalt, [name] -------------------------------------------------------------------------------- SuperBill Details Patient Name: SHAUNTELLE, BACKLUND. Date of Service: 07/12/2021 Medical Record Number: [id number] Patient Account Number: [id number] Date of Birth/Sex: [birth year] (75 y.o. F) Treating RN: Donnamarie Poag Primary Care Provider: [name] Other Clinician: Referring Provider: [name] Treating Provider/Extender: Yaakov Guthrie in Treatment: 13 Diagnosis Coding ICD-10 Codes Code Description [phone] Chronic venous hypertension (idiopathic) with ulcer and inflammation of left lower extremity I89.0 Lymphedema, not elsewhere classified L97.221 Non-pressure chronic ulcer of left calf limited to breakdown of skin Facility Procedures CPT4 Code: [id number] Description: [id number] - DEB SUBQ TISSUE 20 SQ CM/< Modifier: Quantity: 1 CPT4 Code: Description: ICD-10 Diagnosis Description L97.221 Non-pressure chronic ulcer of left calf limited to breakdown of skin Modifier: Quantity: Physician Procedures CPT4 Code: [id number] Description: 11042 - WC PHYS SUBQ TISS 20 SQ CM Modifier: Quantity: 1 CPT4 Code: Description: ICD-10 Diagnosis Description L97.221 Non-pressure chronic ulcer of left calf limited to breakdown of skin Modifier: Quantity: Electronic Signature(s) Signed: 07/12/2021 3:10:56 PM By: Kalman Shan DO Entered By: Kalman Shan on 07/12/2021 14:33:05

## 2021-07-14 ENCOUNTER — Other Ambulatory Visit: Payer: Self-pay

## 2021-07-14 DIAGNOSIS — Z8249 Family history of ischemic heart disease and other diseases of the circulatory system: Secondary | ICD-10-CM | POA: Diagnosis not present

## 2021-07-14 DIAGNOSIS — L97821 Non-pressure chronic ulcer of other part of left lower leg limited to breakdown of skin: Secondary | ICD-10-CM | POA: Diagnosis not present

## 2021-07-14 DIAGNOSIS — I890 Lymphedema, not elsewhere classified: Secondary | ICD-10-CM | POA: Diagnosis not present

## 2021-07-14 DIAGNOSIS — S81801A Unspecified open wound, right lower leg, initial encounter: Secondary | ICD-10-CM | POA: Diagnosis not present

## 2021-07-14 DIAGNOSIS — Z87891 Personal history of nicotine dependence: Secondary | ICD-10-CM | POA: Diagnosis not present

## 2021-07-14 DIAGNOSIS — Z86718 Personal history of other venous thrombosis and embolism: Secondary | ICD-10-CM | POA: Diagnosis not present

## 2021-07-14 DIAGNOSIS — Z7902 Long term (current) use of antithrombotics/antiplatelets: Secondary | ICD-10-CM | POA: Diagnosis not present

## 2021-07-14 DIAGNOSIS — Z8349 Family history of other endocrine, nutritional and metabolic diseases: Secondary | ICD-10-CM | POA: Diagnosis not present

## 2021-07-14 DIAGNOSIS — L97221 Non-pressure chronic ulcer of left calf limited to breakdown of skin: Secondary | ICD-10-CM | POA: Diagnosis not present

## 2021-07-14 NOTE — Progress Notes (Signed)
GITA, DESHOTELS ([id number]) Visit Report for 07/14/2021 Arrival Information Details Patient Name: [name], [name]. Date of Service: 07/14/2021 1:30 PM Medical Record Number: [id number] Patient Account Number: [id number] Date of Birth/Sex: [birth year] (75 y.o. F) Treating RN: Dolan Amen Primary Care Ragan Reale: [name] Other Clinician: Referring Kaven Cumbie: [name] Treating Maritsa Hunsucker/Extender: [name] in Treatment: 13 Visit Information History Since Last Visit Pain Present Now: No Patient Arrived: Ambulatory Arrival Time: 13:22 Accompanied By: self Transfer Assistance: None Patient Identification Verified: Yes Secondary Verification Process Completed: Yes Patient Requires Transmission-Based No Precautions: Patient Has Alerts: Yes Patient Alerts: Patient on Blood Thinner ASPIRIN NOT diabetic Electronic Signature(s) Signed: 07/14/2021 4:22:57 PM By: Dolan Amen RN Entered By: Dolan Amen on 07/14/2021 13:49:54 Carino, [name] -------------------------------------------------------------------------------- Clinic Level of Care Assessment Details Patient Name: [name], [name]. Date of Service: 07/14/2021 1:30 PM Medical Record Number: [id number] Patient Account Number: [id number] Date of Birth/Sex: Feb 20, 1946 (75 y.o. F) Treating RN: Dolan Amen Primary Care Crysten Kaman: [name] Other Clinician: Referring Wilber Fini: [name] Treating Tamiyah Moulin/Extender: [name] in Treatment: 13 Clinic Level of Care Assessment Items TOOL 1 Quantity Score '[]'$  - Use when EandM and Procedure is performed on INITIAL visit 0 ASSESSMENTS - Nursing Assessment / Reassessment '[]'$  - General Physical Exam (combine w/ comprehensive assessment (listed just below) when performed on new 0 pt. evals) '[]'$  - 0 Comprehensive Assessment (HX, ROS, Risk Assessments, Wounds Hx, etc.) ASSESSMENTS - Wound and Skin Assessment / Reassessment '[]'$  -  Dermatologic / Skin Assessment (not related to wound area) 0 ASSESSMENTS - Ostomy and/or Continence Assessment and Care '[]'$  - Incontinence Assessment and Management 0 '[]'$  - 0 Ostomy Care Assessment and Management (repouching, etc.) PROCESS - Coordination of Care '[]'$  - Simple Patient / Family Education for ongoing care 0 '[]'$  - 0 Complex (extensive) Patient / Family Education for ongoing care '[]'$  - 0 Staff obtains Programmer, systems, Records, Test Results / Process Orders '[]'$  - 0 Staff telephones HHA, Nursing Homes / Clarify orders / etc '[]'$  - 0 Routine Transfer to another Facility (non-emergent condition) '[]'$  - 0 Routine Hospital Admission (non-emergent condition) '[]'$  - 0 New Admissions / Biomedical engineer / Ordering NPWT, Apligraf, etc. '[]'$  - 0 Emergency Hospital Admission (emergent condition) PROCESS - Special Needs '[]'$  - Pediatric / Minor Patient Management 0 '[]'$  - 0 Isolation Patient Management '[]'$  - 0 Hearing / Language / Visual special needs '[]'$  - 0 Assessment of Community assistance (transportation, D/C planning, etc.) '[]'$  - 0 Additional assistance / Altered mentation '[]'$  - 0 Support Surface(s) Assessment (bed, cushion, seat, etc.) INTERVENTIONS - Miscellaneous '[]'$  - External ear exam 0 '[]'$  - 0 Patient Transfer (multiple staff / Civil Service fast streamer / Similar devices) '[]'$  - 0 Simple Staple / Suture removal (25 or less) '[]'$  - 0 Complex Staple / Suture removal (26 or more) '[]'$  - 0 Hypo/Hyperglycemic Management (do not check if billed separately) '[]'$  - 0 Ankle / Brachial Index (ABI) - do not check if billed separately Has the patient been seen at the hospital within the last three years: Yes Total Score: 0 Level Of Care: ____ [name] Electronic Signature(s) Signed: 07/14/2021 4:22:57 PM By: Dolan Amen RN Entered By: Dolan Amen on 07/14/2021 13:50:52 Stegmaier, [name]  ([id number]) -------------------------------------------------------------------------------- Compression Therapy Details Patient Name: [name], [name]. Date of Service: 07/14/2021 1:30 PM Medical Record Number: [id number] Patient Account Number: [id number] Date of Birth/Sex: 02/21/1946 (75 y.o. F) Treating RN: Dolan Amen Primary Care Lacinda Curvin: Danise Mina,  Garlon Hatchet Other Clinician: Referring Neidy Guerrieri: [name] Treating Joely Losier/Extender: Jeri Cos Weeks in Treatment: 13 Compression Therapy Performed for Wound Assessment: Wound #13 Left,Distal,Posterior Lower Leg Performed By: Cora Daniels, RN Compression Type: Four Layer Pre Treatment ABI: 0.9 Electronic Signature(s) Signed: 07/14/2021 4:22:57 PM By: Dolan Amen RN Entered By: Dolan Amen on 07/14/2021 13:50:23 Correll, [name] ([id number]) -------------------------------------------------------------------------------- Encounter Discharge Information Details Patient Name: [name], [name]. Date of Service: 07/14/2021 1:30 PM Medical Record Number: [id number] Patient Account Number: [id number] Date of Birth/Sex: 06-15-1946 (75 y.o. F) Treating RN: Dolan Amen Primary Care Florabel Faulks: [name] Other Clinician: Referring Mackinzie Vuncannon: [name] Treating Felicite Zeimet/Extender: [name] in Treatment: 13 Encounter Discharge Information Items Discharge Condition: Stable Ambulatory Status: Ambulatory Discharge Destination: Home Transportation: Private Auto Accompanied By: self Schedule Follow-up Appointment: Yes Clinical Summary of Care: Electronic Signature(s) Signed: 07/14/2021 4:22:57 PM By: Dolan Amen RN Entered By: Dolan Amen on 07/14/2021 13:50:48 Stegman, [name] ([id number]) -------------------------------------------------------------------------------- Wound Assessment Details Patient Name: [name], [name]. Date of Service: 07/14/2021 1:30 PM Medical Record Number:  [id number] Patient Account Number: [id number] Date of Birth/Sex: [birth year] (75 y.o. F) Treating RN: Dolan Amen Primary Care Hadassah Rana: [name] Other Clinician: Referring Twila Rappa: [name] Treating Teryl Gubler/Extender: [name] in Treatment: 13 Wound Status Wound Number: 13 Primary Etiology: Venous Leg Ulcer Wound Location: Left, Distal, Posterior Lower Leg Wound Status: Open Wounding Event: Gradually Appeared Date Acquired: 02/24/2021 Weeks Of Treatment: 13 Clustered Wound: No Wound Measurements Length: (cm) 1.6 Width: (cm) 1 Depth: (cm) 0.2 Area: (cm) 1.257 Volume: (cm) 0.251 % Reduction in Area: 78.7% % Reduction in Volume: 57.4% Wound Description Classification: Full Thickness Without Exposed Support Structu Exudate Amount: Medium Exudate Type: Serosanguineous Exudate Color: red, brown res Treatment Notes Wound #13 (Lower Leg) Wound Laterality: Left, Posterior, Distal Cleanser Soap and Water Discharge Instruction: Gently cleanse wound with antibacterial soap, rinse and pat dry prior to dressing wounds Peri-Wound Care Topical Gentamicin Discharge Instruction: to wound bed Apply as directed by [place] Sarinana. Triamcinolone Acetonide Cream, 0.1%, 15 (g) tube Discharge Instruction: Peri wound Apply as directed by Teya Otterson. Primary Dressing Prisma 4.34 (in) Discharge Instruction: Moisten w/normal saline or sterile water; Cover wound as directed. Do not remove from wound bed. Secondary Dressing ABD Pad 5x9 (in/in) Discharge Instruction: Cover with ABD pad Secured With Compression Wrap Medichoice 4 layer Compression System, 35-40 mmHG Discharge Instruction: Apply multi-layer wrap as directed. Compression Stockings Add-Ons [name], [name] Electronic Signature(s) Signed: 07/14/2021 4:22:57 PM By: Dolan Amen RN Entered By: Dolan Amen on 07/14/2021 13:50:02

## 2021-07-15 LAB — AEROBIC CULTURE W GRAM STAIN (SUPERFICIAL SPECIMEN)
Culture: NO GROWTH
Gram Stain: NONE SEEN

## 2021-07-19 ENCOUNTER — Other Ambulatory Visit: Payer: Self-pay

## 2021-07-19 ENCOUNTER — Encounter (HOSPITAL_BASED_OUTPATIENT_CLINIC_OR_DEPARTMENT_OTHER): Payer: Medicare Other | Admitting: Internal Medicine

## 2021-07-19 DIAGNOSIS — L97221 Non-pressure chronic ulcer of left calf limited to breakdown of skin: Secondary | ICD-10-CM

## 2021-07-19 DIAGNOSIS — I890 Lymphedema, not elsewhere classified: Secondary | ICD-10-CM

## 2021-07-19 DIAGNOSIS — I87332 Chronic venous hypertension (idiopathic) with ulcer and inflammation of left lower extremity: Secondary | ICD-10-CM

## 2021-07-19 DIAGNOSIS — L97821 Non-pressure chronic ulcer of other part of left lower leg limited to breakdown of skin: Secondary | ICD-10-CM | POA: Diagnosis not present

## 2021-07-19 NOTE — Progress Notes (Signed)
Amber, [name] ([id number]) Visit Report for 07/19/2021 Arrival Information Details Patient Name: [name], [name]. Date of Service: 07/19/2021 1:15 PM Medical Record Number: [id number] Patient Account Number: [id number] Date of Birth/Sex: 02/28/1946 (75 y.o. F) Treating RN: Donnamarie Poag Primary Care Damesha Lawler: [name] Other Clinician: Referring Yigit Norkus: [name] Treating Georgeann Brinkman/Extender: Yaakov Guthrie in Treatment: 14 Visit Information History Since Last Visit Added or deleted any medications: No Patient Arrived: Ambulatory Hospitalized since last visit: No Arrival Time: 13:19 Has Dressing in Place as Prescribed: Yes Accompanied By: self Has Compression in Place as Prescribed: Yes Transfer Assistance: None Pain Present Now: No Patient Identification Verified: Yes Secondary Verification Process Completed: Yes Patient Requires Transmission-Based No Precautions: Patient Has Alerts: Yes Patient Alerts: Patient on Blood Thinner ASPIRIN NOT diabetic Electronic Signature(s) Signed: 07/19/2021 3:44:27 PM By: Donnamarie Poag Entered By: Donnamarie Poag on 07/19/2021 13:23:38 Steffy, [name] -------------------------------------------------------------------------------- Clinic Level of Care Assessment Details Patient Name: Amber, [name]. Date of Service: 07/19/2021 1:15 PM Medical Record Number: [id number] Patient Account Number: [id number] Date of Birth/Sex: May 15, 1946 (75 y.o. F) Treating RN: Donnamarie Poag Primary Care Angelic Schnelle: [name] Other Clinician: Referring Dieter Hane: [name] Treating Nesa [name]/Extender: Yaakov Guthrie in Treatment: 14 Clinic Level of Care Assessment Items TOOL 1 Quantity Score '[]'$  - Use when EandM and Procedure is performed on INITIAL visit 0 ASSESSMENTS - Nursing Assessment / Reassessment '[]'$  - General Physical Exam (combine w/ comprehensive assessment (listed just below) when performed on new 0 pt.  evals) '[]'$  - 0 Comprehensive Assessment (HX, ROS, Risk Assessments, Wounds Hx, etc.) ASSESSMENTS - Wound and Skin Assessment / Reassessment '[]'$  - Dermatologic / Skin Assessment (not related to wound area) 0 ASSESSMENTS - Ostomy and/or Continence Assessment and Care '[]'$  - Incontinence Assessment and Management 0 '[]'$  - 0 Ostomy Care Assessment and Management (repouching, etc.) PROCESS - Coordination of Care '[]'$  - Simple Patient / Family Education for ongoing care 0 '[]'$  - 0 Complex (extensive) Patient / Family Education for ongoing care '[]'$  - 0 Staff obtains Programmer, systems, Records, Test Results / Process Orders '[]'$  - 0 Staff telephones HHA, Nursing Homes / Clarify orders / etc '[]'$  - 0 Routine Transfer to another Facility (non-emergent condition) '[]'$  - 0 Routine Hospital Admission (non-emergent condition) '[]'$  - 0 New Admissions / Biomedical engineer / Ordering NPWT, Apligraf, etc. '[]'$  - 0 Emergency Hospital Admission (emergent condition) PROCESS - Special Needs '[]'$  - Pediatric / Minor Patient Management 0 '[]'$  - 0 Isolation Patient Management '[]'$  - 0 Hearing / Language / Visual special needs '[]'$  - 0 Assessment of Community assistance (transportation, D/C planning, etc.) '[]'$  - 0 Additional assistance / Altered mentation '[]'$  - 0 Support Surface(s) Assessment (bed, cushion, seat, etc.) INTERVENTIONS - Miscellaneous '[]'$  - External ear exam 0 '[]'$  - 0 Patient Transfer (multiple staff / Civil Service fast streamer / Similar devices) '[]'$  - 0 Simple Staple / Suture removal (25 or less) '[]'$  - 0 Complex Staple / Suture removal (26 or more) '[]'$  - 0 Hypo/Hyperglycemic Management (do not check if billed separately) '[]'$  - 0 Ankle / Brachial Index (ABI) - do not check if billed separately Has the patient been seen at the hospital within the last three years: Yes Total Score: 0 Level Of Care: ____ [name] Electronic Signature(s) Signed: 07/19/2021 3:44:27 PM By: Donnamarie Poag Entered By: Donnamarie Poag on  07/19/2021 14:24:58 Keenum, [name] -------------------------------------------------------------------------------- Encounter Discharge Information Details Patient Name: Amber, [name]. Date of Service: 07/19/2021 1:15 PM Medical Record Number:  [id number] Patient Account Number: [id number] Date of Birth/Sex: 10-25-1946 (75 y.o. F) Treating RN: Donnamarie Poag Primary Care Linsy Ehresman: [name] Other Clinician: Referring Jericka Kadar: [name] Treating Jaedyn Lard/Extender: Yaakov Guthrie in Treatment: 14 Encounter Discharge Information Items Post Procedure Vitals Discharge Condition: Stable Temperature (F): 98.4 Ambulatory Status: Ambulatory Pulse (bpm): 76 Discharge Destination: Home Respiratory Rate (breaths/min): 16 Transportation: Private Auto Blood Pressure (mmHg): 143/78 Accompanied By: self Schedule Follow-up Appointment: Yes Clinical Summary of Care: Electronic Signature(s) Signed: 07/19/2021 3:44:27 PM By: Donnamarie Poag Entered By: Donnamarie Poag on 07/19/2021 14:26:19 Mruk, [name] -------------------------------------------------------------------------------- Lower Extremity Assessment Details Patient Name: Amber, [name]. Date of Service: 07/19/2021 1:15 PM Medical Record Number: [id number] Patient Account Number: [id number] Date of Birth/Sex: [birth year] (75 y.o. F) Treating RN: Donnamarie Poag Primary Care Dafney Farler: [name] Other Clinician: Referring Vinal Rosengrant: [name] Treating Heer Justiss/Extender: Yaakov Guthrie in Treatment: 14 Edema Assessment Assessed: [name]: Yes] Patrice Paradise: No] [Left: Edema] [Right: :] Calf Left: Right: Point of Measurement: 32 cm From Medial Instep 41 cm Ankle Left: Right: Point of Measurement: 10 cm From Medial Instep 21.5 cm Knee To Floor Left: Right: From Medial Instep 42 cm Vascular Assessment Pulses: Dorsalis Pedis Palpable: [Left:Yes] Electronic Signature(s) Signed:  07/19/2021 3:44:27 PM By: Donnamarie Poag Entered By: Donnamarie Poag on 07/19/2021 13:31:25 Kemppainen, [name] -------------------------------------------------------------------------------- Multi Wound Chart Details Patient Name: LAYAAN, QUIST. Date of Service: 07/19/2021 1:15 PM Medical Record Number: [id number] Patient Account Number: [id number] Date of Birth/Sex: 1946-04-08 (75 y.o. F) Treating RN: Donnamarie Poag Primary Care Landin Tallon: [name] Other Clinician: Referring Zavon Hyson: [name] Treating Clark Cuff/Extender: Yaakov Guthrie in Treatment: 14 Vital Signs Height(in): 63 Pulse(bpm): 82 Weight(lbs): 212 Blood Pressure(mmHg): 143/78 Body Mass Index(BMI): 38 Temperature(F): 98.4 Respiratory Rate(breaths/min): 16 Photos: [N/A:N/A] Wound Location: Left, Distal, Posterior Lower Leg N/A N/A Wounding Event: Gradually Appeared N/A N/A Primary Etiology: Venous Leg Ulcer N/A N/A Comorbid History: Cataracts, Asthma, Sleep Apnea, N/A N/A Deep Vein Thrombosis, Hypertension, Peripheral Venous Disease, Osteoarthritis, Received Chemotherapy, Received Radiation Date Acquired: 02/24/2021 N/A N/A Weeks of Treatment: 14 N/A N/A Wound Status: Open N/A N/A Measurements L x W x D (cm) 0.8x0.7x0.2 N/A N/A Area (cm) : 0.44 N/A N/A Volume (cm) : 0.088 N/A N/A % Reduction in Area: 92.50% N/A N/A % Reduction in Volume: 85.10% N/A N/A Classification: Full Thickness Without Exposed N/A N/A Support Structures Exudate Amount: Medium N/A N/A Exudate Type: Serosanguineous N/A N/A Exudate Color: red, brown N/A N/A Granulation Amount: Medium (34-66%) N/A N/A Granulation Quality: Red, Pink N/A N/A Necrotic Amount: Medium (34-66%) N/A N/A Exposed Structures: Fat Layer (Subcutaneous Tissue): N/A N/A Yes Fascia: No Tendon: No Muscle: No Joint: No Bone: No Debridement: Debridement - Excisional N/A N/A Pre-procedure Verification/Time 14:03 N/A N/A Out Taken: Pain  Control: Lidocaine N/A N/A Tissue Debrided: Subcutaneous, Slough N/A N/A Level: Skin/Subcutaneous Tissue N/A N/A Debridement Area (sq cm): 0.56 N/A N/A Instrument: Curette N/A N/A Bleeding: Minimum N/A N/A Hemostasis Achieved: Pressure N/A N/A Debridement Treatment Procedure was tolerated well N/A N/A ResponseMARIDELL, VOLMER ([id number]) Post Debridement 0.8x0.7x0.2 N/A N/A Measurements L x W x D (cm) Post Debridement Volume: 0.088 N/A N/A (cm) Procedures Performed: Cellular or Tissue Based Product N/A N/A Debridement Treatment Notes Electronic Signature(s) Signed: 07/19/2021 2:28:04 PM By: Kalman Shan DO Entered By: Kalman Shan on 07/19/2021 14:23:40 Cato, [name] -------------------------------------------------------------------------------- Multi-Disciplinary Care Plan Details Patient Name: COPELYN, ENIX. Date of Service: 07/19/2021 1:15 PM Medical Record Number: [id number] Patient Account Number: [id number] Date  of Birth/Sex: October 26, 1946 (75 y.o. F) Treating RN: Donnamarie Poag Primary Care Johnell Bas: [name] Other Clinician: Referring Nakhi Choi: [name] Treating Ivor Kishi/Extender: Yaakov Guthrie in Treatment: 14 Active Inactive Electronic Signature(s) Signed: 07/19/2021 3:44:27 PM By: Donnamarie Poag Entered By: Donnamarie Poag on 07/19/2021 14:02:36 Willard, [name] ([id number]) -------------------------------------------------------------------------------- Pain Assessment Details Patient Name: CORTINA, HALIM. Date of Service: 07/19/2021 1:15 PM Medical Record Number: [id number] Patient Account Number: [id number] Date of Birth/Sex: 1946-06-15 (75 y.o. F) Treating RN: Donnamarie Poag Primary Care Yamil Dougher: [name] Other Clinician: Referring Shannen Flansburg: [name] Treating Shianne Zeiser/Extender: Yaakov Guthrie in Treatment: 14 Active Problems Location of Pain Severity and Description of Pain Patient Has Paino  No Site Locations Rate the pain. Current Pain Level: 0 Pain Management and Medication Current Pain Management: Electronic Signature(s) Signed: 07/19/2021 3:44:27 PM By: Donnamarie Poag Entered By: Donnamarie Poag on 07/19/2021 13:24:02 Backs, [name] ([id number]) -------------------------------------------------------------------------------- Patient/Caregiver Education Details Patient Name: [name] Date of Service: 07/19/2021 1:15 PM Medical Record Number: [id number] Patient Account Number: [id number] Date of Birth/Gender: 05/21/1946 (75 y.o. F) Treating RN: Donnamarie Poag Primary Care Physician: [name] Other Clinician: Referring Physician: [name] Treating Physician/Extender: Yaakov Guthrie in Treatment: 14 Education Assessment Education Provided To: Patient Education Topics Provided Wound/Skin Impairment: Electronic Signature(s) Signed: 07/19/2021 3:44:27 PM By: Donnamarie Poag Entered By: Donnamarie Poag on 07/19/2021 14:25:21 Creswell, [name] ([id number]) -------------------------------------------------------------------------------- Wound Assessment Details Patient Name: KARLA, STENNES. Date of Service: 07/19/2021 1:15 PM Medical Record Number: [id number] Patient Account Number: [id number] Date of Birth/Sex: [birth year] (75 y.o. F) Treating RN: Donnamarie Poag Primary Care Esperanza Madrazo: [name] Other Clinician: Referring Kendy Haston: [name] Treating Kentavius Dettore/Extender: Yaakov Guthrie in Treatment: 14 Wound Status Wound Number: 13 Primary Venous Leg Ulcer Etiology: Wound Location: Left, Distal, Posterior Lower Leg Wound Open Wounding Event: Gradually Appeared Status: Date Acquired: 02/24/2021 Comorbid Cataracts, Asthma, Sleep Apnea, Deep Vein Thrombosis, Weeks Of Treatment: 14 History: Hypertension, Peripheral Venous Disease, Osteoarthritis, Clustered Wound: No Received Chemotherapy, Received Radiation Photos Wound  Measurements Length: (cm) 0.8 Width: (cm) 0.7 Depth: (cm) 0.2 Area: (cm) 0.44 Volume: (cm) 0.088 % Reduction in Area: 92.5% % Reduction in Volume: 85.1% Tunneling: No Undermining: No Wound Description Classification: Full Thickness Without Exposed Support Structures Exudate Amount: Medium Exudate Type: Serosanguineous Exudate Color: red, brown Foul Odor After Cleansing: No Slough/Fibrino Yes Wound Bed Granulation Amount: Medium (34-66%) Exposed Structure Granulation Quality: Red, Pink Fascia Exposed: No Necrotic Amount: Medium (34-66%) Fat Layer (Subcutaneous Tissue) Exposed: Yes Necrotic Quality: Adherent Slough Tendon Exposed: No Muscle Exposed: No Joint Exposed: No Bone Exposed: No Treatment Notes Wound #13 (Lower Leg) Wound Laterality: Left, Posterior, Distal Cleanser Soap and Water Discharge Instruction: Gently cleanse wound with antibacterial soap, rinse and pat dry prior to dressing wounds Peri-Wound Care REVECA, HOWZE ([id number]) Topical Primary Dressing Secondary Dressing ABD Pad 5x9 (in/in) Discharge Instruction: Cover with ABD pad Secured With Compression Wrap Medichoice 4 layer Compression System, 35-40 mmHG Discharge Instruction: Apply multi-layer wrap as directed. Compression Stockings Add-Ons Electronic Signature(s) Signed: 07/19/2021 3:44:27 PM By: Donnamarie Poag Entered By: Donnamarie Poag on 07/19/2021 13:30:29 Reser, [name] ([id number]) -------------------------------------------------------------------------------- Vitals Details Patient Name: DORANN, VASSELL. Date of Service: 07/19/2021 1:15 PM Medical Record Number: [id number] Patient Account Number: [id number] Date of Birth/Sex: 04/08/1946 (75 y.o. F) Treating RN: Donnamarie Poag Primary Care Jadah Bobak: [name] Other Clinician: Referring Donata Reddick: [name] Treating Chelby Salata/Extender: Yaakov Guthrie in Treatment: 14 Vital Signs Time Taken: 13:22 Temperature (F):  98.4 Height (in):  63 Pulse (bpm): 76 Weight (lbs): 212 Respiratory Rate (breaths/min): 16 Body Mass Index (BMI): 37.6 Blood Pressure (mmHg): 143/78 Reference Range: 80 - 120 mg / dl Electronic Signature(s) Signed: 07/19/2021 3:44:27 PM By: Donnamarie Poag Entered ByDonnamarie Poag on 07/19/2021 13:23:55

## 2021-07-19 NOTE — Progress Notes (Addendum)
[name], [name] Visit Report for 07/19/2021 Chief Complaint Document Details Patient Name: [name], [name]. Date of Service: 07/19/2021 1:15 PM Medical Record Number: [id number] Patient Account Number: [id number] Date of Birth/Sex: 1946-06-17 (75 y.o. F) Treating RN: [name] Primary Care Provider: [name] Other Clinician: Referring Provider: [name] Treating Provider/Extender: Yaakov Guthrie in Treatment: 14 Information Obtained from: Patient Chief Complaint Left calf venous stasis ulceration. 11/13/16; the patient is here for a left calf recurrent ulceration which is painful and has been present for the last month 01/22/17; the patient re-presents here for recurrent difficulties with blistering and leaking fluid on her left posterior calf 12/10/18; the patient returns to clinic for a wound on the left medial calf 04/12/2021; left lower extremity wound, 05/31/21; right leg wound Electronic Signature(s) Signed: 07/19/2021 2:28:04 PM By: Kalman Shan DO Entered By: Kalman Shan on 07/19/2021 14:23:53 Scerbo, [name] -------------------------------------------------------------------------------- Cellular or Tissue Based Product Details Patient Name: [name], [name]. Date of Service: 07/19/2021 1:15 PM Medical Record Number: [id number] Patient Account Number: [id number] Date of Birth/Sex: [birth year] (75 y.o. F) Treating RN: [name] Primary Care Provider: [name] Other Clinician: Referring Provider: [name] Treating Provider/Extender: Yaakov Guthrie in Treatment: 14 Cellular or Tissue Based Product Type Wound #13 Left,Distal,Posterior Lower Leg Applied to: Performed By: Physician Kalman Shan, MD Cellular or Tissue Based Product Puraply AM Type: Level of Consciousness (Pre- Awake and Alert procedure): Pre-procedure Verification/Time Out Yes - 14:05 Taken: Location: trunk / arms / legs Wound  Size (sq cm): 0.56 Product Size (sq cm): 4 Waste Size (sq cm): 1 Waste Reason: WOUND SIZE Amount of Product Applied (sq cm): 3 Instrument Used: Forceps, Scissors Lot #: [id number] Expiration Date: 10/16/2023 Reconstituted: Yes Solution Type: SALINE Solution Amount: 2 Lot #: [id number] Solution Expiration Date: 04/06/2023 Secured: Yes Secured With: Steri-Strips Dressing Applied: Yes Primary Dressing: mepitel Response to Treatment: Procedure was tolerated well Level of Consciousness (Post- Awake and Alert procedure): Post Procedure Diagnosis Same as Pre-procedure Electronic Signature(s) Signed: 07/20/2021 9:44:24 AM By: [name] Previous Signature: 07/19/2021 3:44:27 PM Version By: [name] Entered By: [name] on 07/20/2021 09:44:24 Hebner, [name] -------------------------------------------------------------------------------- Debridement Details Patient Name: [name], [name]. Date of Service: 07/19/2021 1:15 PM Medical Record Number: [id number] Patient Account Number: [id number] Date of Birth/Sex: 1946-05-22 (75 y.o. F) Treating RN: [name] Primary Care Provider: [name] Other Clinician: Referring Provider: [name] Treating Provider/Extender: Yaakov Guthrie in Treatment: 14 Debridement Performed for Wound #13 Left,Distal,Posterior Lower Leg Assessment: Performed By: Physician Kalman Shan, MD Debridement Type: Debridement Severity of Tissue Pre Debridement: Fat layer exposed Level of Consciousness (Pre- Awake and Alert procedure): Pre-procedure Verification/Time Out Yes - 14:03 Taken: Start Time: 14:03 Pain Control: Lidocaine Total Area Debrided (L x W): 0.8 (cm) x 0.7 (cm) = 0.56 (cm) Tissue and other material Viable, Non-Viable, Slough, Subcutaneous, Biofilm, Slough debrided: Level: Skin/Subcutaneous Tissue Debridement Description: Excisional Instrument: Curette Bleeding: Minimum Hemostasis Achieved:  Pressure Response to Treatment: Procedure was tolerated well Level of Consciousness (Post- Awake and Alert procedure): Post Debridement Measurements of Total Wound Length: (cm) 0.8 Width: (cm) 0.7 Depth: (cm) 0.2 Volume: (cm) 0.088 Character of Wound/Ulcer Post Debridement: Improved Severity of Tissue Post Debridement: Fat layer exposed Post Procedure Diagnosis Same as Pre-procedure Electronic Signature(s) Signed: 07/19/2021 2:28:04 PM By: Kalman Shan DO Signed: 07/19/2021 3:44:27 PM By: [name] Entered By: [name] on 07/19/2021 14:04:52 Keeble, [name] -------------------------------------------------------------------------------- HPI Details Patient Name: [name], [name].  Date of Service: 07/19/2021 1:15 PM Medical Record Number: [id number] Patient Account Number: [id number] Date of Birth/Sex: January 23, 1946 (75 y.o. F) Treating RN: [name] Primary Care Provider: [name] Other Clinician: Referring Provider: [name] Treating Provider/Extender: Yaakov Guthrie in Treatment: 14 History of Present Illness HPI Description: Pleasant 75 year old with history of chronic venous insufficiency. No diabetes or peripheral vascular disease. Left ABI 1.29. Questionable history of left lower extremity DVT. She developed a recurrent ulceration on her left lateral calf in December 2015, which she attributes to poor diet and subsequent lower extremity edema. She underwent endovenous laser ablation of her left greater saphenous vein in 2010. She underwent laser ablation of accessory branch of left GSV in April 2016 by Dr. Kellie Simmering at [facility]. She was previously wearing Unna boots, which she tolerated well. Tolerating 2 layer compression and cadexomer iodine. She returns to clinic for follow-up and is without new complaints. She denies any significant pain at this time. She reports persistent pain with pressure. No claudication or ischemic rest  pain. No fever or chills. No drainage. READMISSION 11/13/16; this is a 75 year old woman who is not a diabetic. She is here for a review of a painful area on her left medial lower extremity. I note that she was seen here previously last year for wound I believe to be in the same area. At that time she had undergone previously a left greater saphenous vein ablation by Dr. Kellie Simmering and she had a ablation of the anterior accessory branch of the left greater saphenous vein in March 2016. Seeing that the wound actually closed over. In reviewing the history with her today the ulcer in this area has been recurrent. She describes a biopsy of this area in 2009 that only showed stasis physiology. She also has a history of today malignant melanoma in the right shoulder for which she follows with Dr. 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 Do not see that she has ever had arterial studies in the left leg. She tells me she has a follow-up with Dr. Kellie Simmering in roughly 10 days In any case she developed the reopening of this area roughly a month ago. On the background of this she describes rapidly increasing edema which has responded to Lasix 40 mg and metolazone 2.5 mg as well as the patient's lymph massage. She has been told she has both venous insufficiency and lymphedema but she cannot tolerate compression stockings 11/28/16; the patient saw Dr. Kellie Simmering recently. Per the patient he did arterial Dopplers in the office that did not show evidence of arterial insufficiency, per the patient he stated "treat this like an ordinary venous ulcer". She also saw her dermatologist Dr. Ronnald Ramp who felt that this was more of a vascular ulcer. In general things are improving although she arrives today with increasing bilateral lower extremity edema with weeping a deeper fluid through the wound on the left medial leg  compatible with some degree of lymphedema 12/04/16; the patient's wound is fully epithelialized but I don't think fully healed. We will do another week of depression with Promogran and TCA however I suspect we'll be able to discharge her next week. This is a very unusual-looking wound which was initially a figure-of-eight type wound lying on its side surrounded by petechial like hemorrhage. She has had venous ablation on this side. She apparently does not have an arterial issue  per Dr. Kellie Simmering. She saw her dermatologist thought it was "vascular". Patient is definitely going to need ongoing compression and I talked about this with her today she will go to elastic therapy after she leaves here next week 12/11/16; the patient's wound is not completely closed today. She has surrounding scar tissue and in further discussion with the patient it would appear that she had ulcers in this area in 2009 for a prolonged period of time ultimately requiring a punch biopsy of this area that only showed venous insufficiency. I did not previously pickup on this part of the history from the patient. 12/18/16; the patient's wound is completely epithelialized. There is no open area here. She has significant bilateral venous insufficiency with secondary lymphedema to a mild-to-moderate degree she does not have compression stockings.. She did not say anything to me when I was in the room, she told our intake nurse that she was still having pain in this area. This isn't unusual recurrent small open area. She is going to go to elastic therapy to obtain compression stockings. 12/25/16; the patient's wound is fully epithelialized. There is no open area here. The patient describes some continued episodic discomfort in this area medial left calf. However everything looks fine and healed here. She is been to elastic therapy and caught herself 15-20 mmHg stockings, they apparently were having trouble getting 20-30 mm stockings in her  size 01/22/17; this is a patient we discharged from the clinic a month ago. She has a recurrent open wound on her medial left calf. She had 15 mm support stockings. I told her I thought she needed 20-30 mm compression stockings. She tells me that she has been ill with hospitalization secondary to asthma and is been found to have severe hypokalemia likely secondary to a combination of Lasix and metolazone. This morning she noted blistering and leaking fluid on the posterior part of her left leg. She called our intake nurse urgently and we was saw her this afternoon. She has not had any real discomfort here. I don't know that she's been wearing any stockings on this leg for at least 2-3 days. ABIs in this clinic were 1.21 on the right and 1.3 on the left. She is previously seen vascular surgery who does not think that there is a peripheral arterial issue. 01/30/17; Patient arrives with no open wound on the left leg. She has been to elastic therapy and obtained 20-24mhg below knee stockings and she has one on the right leg today. READMISSION 02/19/18; this PEgbersis a now 75year old patient we've had in this clinic perhaps 3 times before. I had last looked at her from January 07 December 2016 with an area on the medial left leg. We discharged her on 12/25/16 however she had to be readmitted on 01/22/17 with a recurrence. I have in my notes that we discharged her on 20-30 mm stockings although she tells me she was only wearing support hose because she cannot get stockings on predominantly related to her cervical spine surgery/issues. [place] the left with an anterior accessory branch ablation I think both of these were in 2016. On one of the previous visit she had a biopsy noted 2009 that was negative. She is not felt to have an arterial issue. She is not a diabetic. She does have a history of  obstructive sleep apnea hypertension asthma as well as chronic venous insufficiency  and lymphedema. On this occasion she noted 2 dry scaly patch on her left leg. She tried to put lotion on this it didn't really help. There were 2 open areas.the patient has been seeing her primary physician from 02/05/18 through 02/14/18. She had Unna boots applied. The superior wound now on the lateral left leg has closed but she's had one wound that remains open on the lateral left leg. This is not the same spot as we dealt with in 2018. ABIs in this clinic were 1.3 bilaterally [name], [name] 02/26/18; patient has a small wound on the left lateral calf. Dimensions are down. She has chronic venous insufficiency and lymphedema. 03/05/18; small open area on the left lateral calf. Dimensions are down. Tightly adherent necrotic debris over the surface of the wound which was difficult to remove. Also the dressing [over collagen] stuck to the wound surface. This was removed with some difficulty as well. Change the primary dressing to Hydrofera Blue ready 03/12/18; small open area on the left lateral calf. Comes in with tightly adherent surface eschar as well as some adherent Hydrofera Blue. 03/19/18; open area on the left lateral calf. Again adherent surface eschar as well as some adherent Hydrofera Blue nonviable subcutaneous tissue. She complained of pain all week even with the reduction from 4-3 layer compression I put on last week. Also she had an increase in her ankle and calf measurements probably related to the same thing. 03/26/18; open area on the left lateral calf. A very small open area remains here. We used silver alginate starting last week as the Hydrofera Blue seem to stick to the wound bed. In using 4-layer compression 04/02/18; the open area in the left lateral calf at some adherent slough which I removed there is no open area here. We are able to transition her into her own compression stocking.  Truthfully I think this is probably his support hose. However this does not maintain skin integrity will be limited. She cannot put over the toe compression stockings on because of neck problems hand problems etc. She is allergic to the lining layer of juxta lites. We might be forced to use extremitease stocking should this fail READMIT 11/24/2018 Patient is now a 75 year old woman who is not a diabetic. She has been in this clinic on at least 3 previous occasions largely with recurrent wounds on her left leg secondary to chronic venous insufficiency with secondary lymphedema. Her situation is complicated by inability to get stockings on and an allergy to neoprene which is apparently a component and at least juxta lites and other stockings. As a result she really has not been wearing any stockings on her legs. She tells [place] that roughly 2 or 3 weeks ago she started noticing a stinging sensation just above her ankle on the left medial aspect. She has been diagnosed with pseudogout and she wondered whether this was what she was experiencing. She tried to dress this with something she bought at the store however subsequently it pulled skin off and now she has an open wound that is not improving. She has been using Vaseline gauze with a cover bandage. She saw her primary doctor last week who put an Haematologist on her. ABIs in this clinic was 1.03 on the left 2/12; the area is on the left medial ankle. Odd-looking wound with what looks to be surface epithelialization but a multitude of small petechial openings. This clearly not closed yet. We have been using silver alginate under 3  layer compression with TCA 2/19; the wound area did not look quite as good this week. Necrotic debris over the majority of the wound surface which required debridement. She continues to have a multitude of what looked to be small petechial openings. She reminds [place] that she had a biopsy on this initially during her first outbreak  in 2015 in [place] dermat[place]. She expresses concern about this being a possible melanoma. 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 I assured her that this does not look anything like melanoma. Except for the petechial reaction it does look like a venous insufficiency area and she certainly has evidence of this on both sides 2/26; a difficult area on the left medial ankle. The patient clearly has chronic venous hypertension with some degree of lymphedema. The odd thing about the area is the small petechial hemorrhages. I am not really sure how to explain this. This was present last time and this is not a compression injury. We have been using Hydrofera Blue which I changed to last week 3/4; still using Hydrofera Blue. Aggressive debridement today. She does not have known arterial issues. She has seen Dr. Kellie Simmering at [facility] vein and vascular and and has an ablation on the left. [Anterior accessory branch of the greater saphenous]. From what I remember they did not feel she had an arterial issue. The patient has had this area biopsied in 2009 at [facility] dermatology and by her recollection they said this was "stasis". She is also follow-up with dermatology locally who thought that this was more of a vascular issue 3/11; using Hydrofera Blue. Aggressive debridement today. She does not have an arterial issue. We are using 3 layer compression although we may need to go to 4. The patient has been in for multiple changes to her wrap since I last saw her a week ago. She says that the area was leaking. I do not have too much more information on what was found 01/19/19 on evaluation today patient was actually being seen for a nurse visit when unfortunately she had the area on her left lateral lower extremity as well as weeping from the right lower extremity that became apparent. Therefore we did end up actually  seeing her for a full visit with myself. She is having some pain at this site as well but fortunately nothing too significant at this point. No fevers, chills, nausea, or vomiting noted at this time. 3/18-Patient is back to the clinic with the left leg venous leg ulcer, the ulcer is larger in size, has a surface that is densely adherent with fibrinous tissue, the Hydrofera Blue was used but is densely adherent and there was difficulty in removing it. The right lower extremity was also wrapped for weeping edema. Patient has a new area over the left lateral foot above the malleolus that is small and appears to have no debris with intact surrounding skin. Patient is on increased dose of Lasix also as a means to edema management 3/25; the patient has a nonhealing venous ulcer on the medial left leg and last week developed a smaller area on the lateral left calf. We have been using Hydrofera Blue with a contact layer. 4/1; no major change in these wounds areas. Left medial and more recently left lateral calf. I tried Iodoflex last week to aid in debridement she did not tolerate this. She stated her pain was terrible all week. She took the  top layer of the 4 layer compression off. 4/8; the patient actually looks somewhat better in terms of her more prominent left lateral calf wound. There is some healthy looking tissue here. She is still complaining of a lot of discomfort. 4/15; patient in a lot of pain secondary to sciatica. She is on a prednisone taper prescribed by her primary physician. She has the 2 areas one on the left medial and more recently a smaller area on the left lateral calf. Both of these just above the malleoli 4/22; her back pain is better but she still states she is very uncomfortable and now feels she is intolerant to the [employer]. No real change in the wounds we have been using Sorbact. She has been previously intolerant to Iodoflex. There is not a lot of option about what we can use  to debride this wound under compression that she no doubt needs. sHe states Ultram no longer works for her pain 4/29; no major change in the wounds slightly increased depth. Surface on the original medial wound perhaps somewhat improved however the more recent area on the lateral left ankle is 100% covered in very adherent debris we have been using Sorbact. She tolerates 4 layer compression well and her edema control is a lot better. She has not had to come in for a nurse check 5/6; no major change in the condition of the wounds. She did consent to debridement today which was done with some difficulty. Continuing Sorbact. She did not tolerate Iodoflex. She was in for a check of her compression the day after we wrapped her last week this was adjusted but nothing much was found 5/13; no major change in the condition or area of the wounds. I was able to get a fairly aggressive debridement done on the lateral left leg wound. Even using Sorbact under compression. She came back in on Friday to have the wrap changed. She says she felt uncomfortable on the lateral aspect of her ankle. She has a long history of chronic venous insufficiency including previous ablation surgery on this side. 5/20-Patient returns for wounds on left leg with both wounds covered in slough, with the lateral leg wound larger in size, she has been in 3 layer compression and felt more comfortable, she describes pain in ankle, in leg and pins and needles in foot, and is about to try Pamelor for this 6/3; wounds on the left lateral and left medial leg. The area medially which is the most recent of the 2 seems to have had the largest increase in [name], [name]. ([id number]) dimensions. We have been using Sorbac to try and debride the surface. She has been to see orthopedics they apparently did a plain x-ray that was indeterminant. Diagnosed her with neuropathy and they have ordered an MRI to determine if there is underlying osteomyelitis.  This was not high on my thought list but I suppose it is prudent. We have advised her to make an appointment with vein and vascular in [place]. She has a history of a left greater saphenous and accessory vein ablations I wonder if there is anything else that can be done from a surgical point of view to help in these difficult refractory wounds. We have previously healed this wound on one occasion but it keeps on reopening [medial side] 6/10; deep tissue culture I did last week I think on the left medial wound showed both moderate E. coli and moderate staph aureus [MSSA]. She is going to require antibiotics  and I have chosen Augmentin. We have been using Sorbact and we have made better looking wound surface on both sides but certainly no improvement in wound area. She was back in last Friday apparently for a dressing changes the wrap was hurting her outer left ankle. She has not managed to get a hold of vein and vascular in [place]. We are going to have to make her that appointment 6/17; patient is tolerating the Augmentin. She had an MRI that I think was ordered by orthopedic surgeon this did not show osteomyelitis or an abscess did suggest cellulitis. We have been using Sorbact to the lateral and medial ankles. We have been trying to arrange a follow-up appointment with vein and vascular in [place] or did her original ablations. We apparently an area sent the request to vein and vascular in [facility] 6/24; patient has completed the Augmentin. We do not yet have a vein and vascular appointment in [place]. I am not sure what the issue is here we have asked her to call tomorrow. We are using Sorbact. Making some improvements and especially the medial wound. Both surfaces however look better medial and lateral. 7/1; the patient has been in contact with vein and vascular in [place] but has not yet received an appointment. Using Sorbact we have gradually improve the wound surface with no  improvement in surface area. She is approved for Apligraf but the wound surface still is not completely viable. She has not had to come in for a dressing change 7/8; the patient has an appointment with vein and vascular on 7/31 which is a Friday afternoon. She is concerned about getting back here for [place] to dress her wounds. I think it is important to have them goal for her venous reflux/history of ablations etc. to see if anything else can be done. She apparently tested positive for 1 of the blood tests with regards to lupus and saw a rheumatologist. He has raised the issue of vasculitis again. I have had this thought in the past however the evidence seems overwhelming that this is a venous reflux etiology. If the rheumatologist tells me there is clinical and laboratory investigation is positive for lupus I will rethink this. 7/15; the patient's wound surfaces are quite a bit better. The medial area which was her original wound now has no depth although the lateral wound which was the more recent area actually appears larger. Both with viable surfaces which is indeed better. Using Sorbact. I wanted to use Apligraf on her however there is the issue of the vein and vascular appointment on 7/31 at 2:00 in the afternoon which would not allow her to get back to be rewrapped and they would no doubt remove the graft 7/22; the patient's wound surfaces have moderate amount of debris although generally look better. The lateral one is larger with 2 small satellite areas superiorly. We are waiting for her vein and vascular appointment on 7/31. She has been approved for Apligraf which I would like to use after th 7/29; wound surfaces have improved no debridement is required we have been using Sorbact. She sees vein and vascular on Friday with this so question of whether anything can be done to lessen the likelihood of recurrence and/or speed the healing of these areas. She is already had previous ablations. She  no doubt has severe venous hypertension 8/5-Patient returns at 1 week, she was in [employer] for 3 days by her podiatrist, we have been using so backed to the wound,  she has increased pain in both the wounds on the left lower leg especially the more distal one on the lateral aspect 8/12-Patient returns at 1 week and she is agreeable to having debridement in both wounds on her left leg today. We have been using Sorbact, and vascular studies were reviewed at last visit 8/19; the patient arrives with her wounds fairly clean and no debridement is required. We have used Sorbact which is really done a nice job in cleaning up these very difficult wound surfaces. The patient saw Dr. Donzetta Matters of vascular surgery on 7/31. He did not feel that there was an arterial component. He felt that her treated greater saphenous vein is adequately addressed and that the small saphenous vein did not appear to be involved significantly. She was also noted to have deep venous reflux which is not treatable. Dr. Donzetta Matters mentioned the possibility of a central obstructive component leading to reflux and he offered her central venography. She wanted to discuss this or think about it. I have urged her to go ahead with this. She has had recurrent difficult wounds in these areas which do heal but after months in the clinic. If there is anything that can be done to reduce the likelihood of this I think it is worth it. 9/2 she is still working towards getting follow-up with Dr. Donzetta Matters to schedule her CT. Things are quite a bit worse venography. I put Apligraf on 2 weeks ago on both wounds on the medial and lateral part of her left lower leg. She arrives in clinic today with 3 superficial additional wounds above the area laterally and one below the wound medially. She describes a lot of discomfort. I think these are probably wrapped injuries. Does not look like she has cellulitis. 07/20/2019 on evaluation today patient appears to be doing  somewhat poorly in regard to her lower extremity ulcers. She in fact showed signs of erythema in fact we may even be dealing with an infection at this time. Unfortunately I am unsure if this is just infection or if indeed there may be some allergic reaction that occurred as a result of the Apligraf application. With that being said that would be unusual but nonetheless not impossible in this patient is one who is unfortunately allergic to quite a bit. Currently we have been using the Sorbact which seems to do as well as anything for her. I do think we may want to obtain a culture today to see if there is anything showing up there that may need to be addressed. 9/16; noted that last week the wounds look worse in 1 week follow-up of the Apligraf. Using Sorbact as of 2 days ago. She arrives with copious amounts of drainage and new skin breakdown on the back of the left calf. The wounds arm more substantial bilaterally. There is a fair amount of swelling in the left calf no overt DVT there is edema present I think in the left greater than right thigh. She is supposed to go on 9/28 for CT venography. The wounds on the medial and lateral calf are worse and she has new skin breakdown posteriorly at least new for me. This is almost developing into a circumferential wound area The Apligraf was taken off last week which I agree with things are not going in the right direction a culture was done we do not have that back yet. She is on Augmentin that she started 2 days ago 9/23; dressing was changed by her nurses on  Monday. In general there is no improvement in the wound areas although the area looks less angry than last week. She did get Augmentin for MSSA cultured on the 14th. She still appears to have too much swelling in the left leg even with 3 layer compression 9/30; the patient underwent her procedure on 9/28 by Dr. Donzetta Matters at vascular and vein specialist. She was discovered to have the common iliac  vein measuring 12.2 mm but at the level of L4-L5 measured 3 mm. After stenting it measured 10 mm. 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 Rouleaux flow in the common femoral and femoral vein was observed much improved after stenting. We are using silver alginate to the wounds on the medial and lateral ankle on the left. 4 layer compression 10/7; the patient had fluid swelling around her knee and 4 layer compression. At the advice of vein and vascular this was reduced to 3 layer which she is tolerating better. We have been using silver alginate under 3 layer compression since last Friday 10/14; arrives with the areas on the left ankle looking a lot better. Inflammation in the area also a lot better. She came in for a nurse check on 10/9 10/21; continued nice improvement. Slight improvements in surface area of both the medial and lateral wounds on the left. A lot of the satellite lesions in the weeping erythema around these from stasis dermatitis is resolved. We have been using silver alginate [name], [name] 10/28; general improvement in the entire wound areas although not a lot of change in dimensions the wound certainly looks better. There is a lot less in terms of venous inflammation. Continue silver alginate this week however look towards Hydrofera Blue next week 11/4; very adherent debris on the medial wound left wound is not as bad. We have been using silver alginate. Change to [facility] today 11/11; very adherent debris on both wound areas. She went to vein and vascular last week and follow-up they put in [place] boot on this today. He says the [facility] was adherent. Wound is definitely not as good as last week. Especially on the left there the satellite lesions look more prominent 11/18; absolutely no better. erythema on lateral aspect with tenderness. 09/30/2019 on evaluation today patient appears to actually be doing better. Dr. Dellia Nims did put her on  doxycycline last week which I do believe has helped her at this point. Fortunately there is no signs of active infection at this time. No fevers, chills, nausea, vomiting, or diarrhea. I do believe he may want extend the doxycycline for 7 additional days just to ensure everything does completely cleared up the patient is in agreement with that plan. Otherwise she is going require some sharp debridement today 12/2; patient is completing a 2-week course of doxycycline. I gave her this empirically for inflammation as well as infection when I last saw her 2 weeks ago. All of this seems to be better. She is using silver alginate she has the area on the medial aspect of the larger area laterally and the 2 small satellite regions laterally above the major wound. 12/9; the patient's wound on the left medial and left lateral calf look really quite good. We have been using silver alginate. She saw vein and vascular in follow-up on 10/09/2019. She has had a previous left greater saphenous vein ablation by Dr. Oscar La in 2016. More recently she underwent a left common iliac vein stent by Dr. Donzetta Matters on 08/04/2019 due  to May Thurner type lesions. The swelling is improved and certainly the wounds have improved. The patient shows [place] today area on the right medial calf there is almost no wound but leaking lymphedema. She says she start this started 3 or 4 days ago. She did not traumatize it. It is not painful. She does not wear compression on that side 12/16; the patient continues to do well laterally. Medially still requiring debridement. The area on the right calf did not materialize to anything and is not currently open. We wrapped this last time. She has support stockings for that leg although I am not sure they are going to provide adequate compression 12/23; the lateral wound looks stable. Medially still requiring debridement for tightly adherent fibrinous debris. We've been using silver alginate. Surface area not  any different 12/30; neither wound is any better with regards to surface and the area on the left lateral is larger. I been using silver alginate to the left lateral which look quite good last week and Sorbact to the left medial 11/11/2019. Lateral wound area actually looks better and somewhat smaller. Medial still requires a very aggressive debridement today. We have been using Sorbact on both wound areas 1/13; not much better still adherent debris bilaterally. I been using Sorbact. She has severe venous hypertension. Probably some degree of dermal fibrosis distally. I wonder whether tighter compression might help and I am going to try that today. We also need to work on the bioburden 1/20; using Sorbact. She has severe venous hypertension status post stent placement for pelvic vein compression. We applied gentamicin last time to see if we could reduce bioburden I had some discussion with her today about the use of pentoxifylline. This is occasionally used in this setting for wounds with refractory venous insufficiency. However this interacts with Plavix. She tells me that she was put on this after stent placement for 3 months. She will call Dr. Claretha Cooper office to discuss 1/27; we are using gentamicin under Sorbact. 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 She has a stent. Wound medially is measuring smaller this week. Laterally measuring slightly larger although she has some satellite lesions superiorly 2/3; gentamicin under Sorbact under 4-layer compression. 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 She has a stent on Plavix. Her wounds are measuring smaller this week. More substantially laterally where there is a satellite lesion superiorly. 2/10; gentamicin under Sorbac. 4-layer compression. Patient communicated with Dr. Donzetta Matters at vein and vascular in [place]. He is okay with the patient coming off Plavix I will therefore start her on  pentoxifylline for a 1 month trial. In general her wounds look better today. I had some concerns about swelling in the left thigh however she measures 61.5 on the right and 63 on the mid thigh which does not suggest there is any difficulty. The patient is not describing any pain. 2/17; gentamicin under Sorbac 4-layer compression. She has been on pentoxifylline for 1 week and complains of loose stool. No nausea she is eating and drinking well 2/24; the patient apparently came in 2 days ago for a nurse visit when her wrap fell down. Both areas look a little worse this week macerated medially and satellite lesions laterally. Change to silver alginate today 3/3; wounds are larger today especially medially. She also has more swelling in her foot lower leg and I even noted some swelling in her posterior thigh which is tender. I wonder about the patency of her stent. Fortuitously she  sees Dr. Claretha Cooper group on Friday 3/10; Mrs. Cockfield was seen by vein and vascular on 3/5. The patient underwent ultrasound. There was no evidence of thrombosis involving the IVC no evidence of thrombosis involving the right common iliac vein there is no evidence of thrombosis involving the right external iliac vein the left external vein is also patent. The right common iliac vein stent appears patent bilateral common femoral veins are compressible and appear patent. I was concerned about the left common iliac stent however it looks like this is functional. She has some edema in the posterior thigh that was tender she still has that this week. I also note they had trouble finding the pulses in her left foot and booked her for an ABI baseline in 4 weeks. She will follow up in 6 months for repeat IVC duplex. The patient stopped the pentoxifylline because of diarrhea. It does not look like that was being effective in any case. I have advised her to go back on her aspirin 81 mg tablet, vascular it also suggested this 3/17; comes in  today with her wound surfaces a lot better. The excoriations from last week considerably better probably secondary to the TCA. We have been using silver alginate 3/24; comes in today with smaller wounds both medially and laterally. Both required debridement. There are 2 small satellite areas superiorly laterally. She also has a very odd bandlike area in the mid calf almost looking like there was a weakness in the wrap in a localized area. I would write this off as being this however anteriorly she has a small raised ballotable area that is very tender almost reminiscent of an abscess but there was no obvious purulent surface to it. 02/04/20 upon evaluation today patient appears to be doing fairly well in regard to her wounds today. Fortunately there is no signs of active infection at this time. No fevers, chills, nausea, vomiting, or diarrhea. She has been tolerating the dressing changes without complication. Fortunately I feel like she is showing signs of improvement although has been sometime since have seen her. Nonetheless the area of concern that Dr. Dellia Nims had last week where she had possibly an area of the wrap that was we can allow the leg to bulge appears to be doing significantly better today there is no signs of anything worsening. [name], [name] 4/7; the patient's wounds on her medial and lateral left leg continue to contract. We have been using a regular alginate. Last week she developed an area on the right medial lower leg which is probably a venous ulcer as well. 4/14; the wounds on her left medial and lateral lower leg continue to contract. Surface eschar. We have been using regular alginate. The area on the right medial lower leg is closed. We have been putting both legs under 4-layer contraction. The patient went back to see vein and vascular she had arterial studies done which were apparently "quite good" per the patient although I have not read their notes I have  never felt she had an arterial issue. 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 This is been longstanding and refractory to exercise, leg elevation and longstanding use of compression wraps in our clinic as well as compression stockings on the times we have been able to get these to heal 4/21; we thought she actually might be close this week however she arrives in clinic with a lot of edema in her upper left calf and into her posterior  thigh. This is been an intermittent problem here. She says the wrap fell down but it was replaced with a nurse visit on Monday. We are using calcium alginate to the wounds and the wound sizes there not terribly larger than last week but there is a lot more edema 4/28; again wound edges are smaller on both sides. Her edema is better controlled than last time. She is obtained her compression pumps from [employer] although they have not been to her home to set these up. 5/5; left medial and left lateral both look stable. I am not sure the medial is any smaller. We have been using calcium alginate under 4-layer compression. oShe had an area on the right medial. This was eschared today. We have been wrapping this as well. She does not tolerate external compression stockings due to a history of various contact allergies. She has her compression pumps however the representative from the company is coming on her to show her how to use these tomorrow 5/19; patient with severe chronic venous insufficiency secondary to central venous disease. She had a stent placed in her left common iliac vein. She has done better since but still difficult to control wounds. She comes in today with nothing open on the right leg. Her areas on the left medial and left lateral are just about closed. We are using calcium alginate under 4-layer compression. She is using her external compression pumps at home She only has 15-20 support stockings.  States she cannot get anything tighter than that on. 03/30/20-Patient returns at 1 week, the wounds on the left leg are both slightly bigger, the last week she was on 3 layer compression which started to slide down. She is starting to use her lymphedema pumps although she stated on 1 day her right ankle started to swell up and she have to stop that day. Unfortunately the open area seem to oscillate between improving to the point of healing and then flaring up all to do with effectiveness of compression or lack of due to the left leg topography not keeping the compression wraps from rolling down 6/2 patient comes in with a 15/20 mmHg stocking on the right leg. She tells me that she developed a lot of swelling in her ankles she saw orthopedics she was felt to possibly be having a flare of pseudogout versus some other type of arthritis. She was put on steroids for a respiratory issue so that helps with the inflammation. She has not been using the pumps all week. She thinks the left thigh is more swollen than usual and I would agree with that. She has an appointment with Dr. Donzetta Matters 9 days or so from now 6/9; both wounds on the left medial and left lateral are smaller. We have been using calcium alginate under compression. She does not have an open wound on the right leg she is using a stocking and her compression pumps things are going well. She has an appointment with Dr. Donzetta Matters with regards to her stent in the left common iliac vein 6/16; the wounds on the left medial and left lateral ankle continues to contract. The patient saw Dr. Donzetta Matters and I think he seems satisfied. Ordered follow-up venous reflux studies on both sides in September. Cautioned that she may need thigh-high stockings. She has been using calcium alginate under compression on the left and her own stocking on the right leg. She tells [place] there are no open wounds on the right 6/23; left lateral is just  about closed. Medial required debridement  today. We have been using calcium alginate. Extensive discussion about the compression pumps she is only using these on 25 mmHg states she could not take 40 or 30 when the wrap came out to her home to demonstrate these. He said they should not feel tight 6/30; the left lateral wound has a slight amount of eschar. . The area medially is about the same using Hydrofera Blue. 7/7; left lateral wound still has some eschar. I will remove this next week may be closed. The area medially is very small using Hydrofera Blue with improvement. Unfortunately the stockings fell down. Unfortunately the blisters have developed at the edge of where the wrap fell. When this happened she says her legs hurt she did not use her pumps. We are not open Monday for her to come in and change the wraps and she had an appointment yesterday. She also tells me that she is going to have an MRI of her back. She is having pain radiating into her left anterior leg she thinks her from an L5 disc. She saw Dr. Ellene Route of neurosurgery 7/14; the area on the left lateral ankle area is closed. Still a small area medially however it looks better as well. We have been using Hydrofera Blue under 4-layer compression 7/21; left lateral ankle is still closed however her wound on the medial left calf is actually larger. This is probably because Hydrofera Blue got stuck to the wound. She came in for a nurse change on Friday and will do that again this week I was concerned about the amount of swelling that she had last week however she is using her compression pumps twice a day and the swelling seems well controlled 7/28; remaining wound on the left medial lower leg is smaller. We have been using moistened silver collagen under compression she is coming back for a nurse visit. For reasons that were not really clear she was just keeping her legs elevated and not using her compression pumps. I have asked her to use the compression pumps. She does not  have any wounds on the right leg 06/15/20-Patient returns at 2 weeks, her LLE edema is worse and she developed a blister wound that is new and has bigger posterior calf wound on right, we are using Prisma with pad, 4 layer compression. she has been on lasix 40 mg daily 8/18; patient arrives today with things a lot worse than I remember from a few weeks ago. She was seen last week. Noted that her edema was worse and that she now had a left lateral wound as well as deteriorating edema in the medial and posterior part of the lower leg. She says she is using his or her external compression pumps once a day although I wonder about the compliance. 8/25; weeping area on the right medial lower leg. This had actually gotten a small localized area of her compression stocking wet. oOn the left side there is a large denuded area on the posterior medial lower leg and smaller area on the lateral. This was not the original areas that we dealt with. 9/1 the patient's wound on the left leg include the left lateral and left posterior. Larger superficial wounds weeping. She has very poor edema control. Tender localized edema in the left lower medial ankle/heel probably because of localized wrap issues. She freely admits she is not using the compression pumps. She has been up on her feet a lot. She thinks the hydrofera blue  is contributing to the pain she is experiencing.. This is a complaint that I have occasionally heard 9/8; really not much improvement. The patient is still complaining of a lot of pain particularly when she uses compression pumps. I switched her to silver alginate last time because she found the Hydrofera Blue to be irritating. I don't hear much difference in her description with the silver alginate. 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 She has follow-up with Dr. Donzetta Matters I think for ultrasound next month CARYS, NEUMEISTER ([id number]) 9/15; quite a bit of improvement today. We have less edema and more epithelialization in both of her wound areas on the left medial and left lateral calf. These are not the site of her original wounds in this area. She says she has been using her compression pumps for 30 minutes twice a day, there pain issues that never quite understood. Silver alginate as the primary dressing 9/22; continued improvement. Both areas medially and laterally still have a small open area there is some eschar. She continues to complain of left medial ankle pain. Swelling in the leg is in much better condition. We have been using silver alginate 9/29; continued improvement. Both areas medially and laterally in the left calf look as though they are close some minor surface eschar but I think this is epithelialized. She comes in today saying she has a ruptured disc at L4-L5 cannot bend over to put on her stockings. 10/6; patient comes in today with no open wounds on either leg. However her edema on the left leg in the upper one third of the lower leg is poorly controlled nonpitting. She says that she could not use the pumps for 2 days and then she has been using the last couple of days. It is not clear to me she has been able to get her stocking on. She has back problems. Mrs. Starlin has severe chronic venous insufficiency with secondary lymphedema. Her venous insufficiency is partially centrally mediated and that she is now post stent in the left common iliac vein. [facility] Thurner's syndrome/physiology]. She follows up with them on 10/15. She wears 20/30 below-knee stockings. She is supposed to use compression pumps at home although I think her compliance about with this is been less than 100%. I have asked her to use these 3 times a day. Finally I think she has lipodermatosclerosis in the left lower leg with an inverted bottle sign. It is been a major problem controlling the edema in the left leg. The right leg  we have had wounds on but not as significant a problem is on the left READMISSION 04/12/2021 Mrs. Wollam is a 75 year old woman we know well in this clinic. She has severe chronic venous insufficiency. 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 I believe she has had bilateral greater saphenous vein ablation in the past as well. She tells me that this wound opened sometime in March. She had a fall and thinks it was initially abrasion. She developed areas she describes as little blisters on the anterior part of her leg and she saw dermatology and was treated for methicillin staph aureus with several rounds of antibiotics. She has been using support stockings on the left leg and says this is the only thing she can get on. Her compression pump use maybe once a day she says if she  did not use one she use the other. She comes in today with incredible swelling in the left leg with a wound on the left posterior calf. She has been using Neosporin to this previously a hydrocolloid. 6/15; patient arrives back for 1 week follow-up.Marland Kitchen Apparently her wrap fell down she did not call us to replace this. He has poor edema control. She only uses her compression pumps once a day 6/29; patient presents for 1 week follow-up. She has tolerated the compression wrap well. We have been using Iodoflex under the wrap. She has no issues or complaints today. 7/6; patient presents for 1 week follow-up. She states that the compression wrap rolled down her leg 4 days ago. She has been trying to keep the area covered but has no dressings at home to use. She denies signs of infection. 7/13; patient presents for 1 week follow-up. She states that her compression wrap rolled down her right leg and she called our office and had it placed a few days ago. She has been tolerating the current wrap well. She states that the Iodoflex is causing a burning sensation. She denies signs of infection. 7/27;  patient presents for 1 week follow-up She has tolerated the compression wrap well with Hydrofera Blue underneath. She has now developed a wound to her right lower extremity. She reports having a culture done To this area by her dermatologist that she reports is negative. She currently denies signs of infection. 8/30; patient presents for 1 week follow-up. On the left side she has tolerated the compression wrap well with Hydrofera Blue underneath. She reports some discomfort to her right lower extremity with the 3 layer compression. She currently denies signs of infection. 06/14/2021 upon evaluation today patient's wound actually appears to be doing decently well based on what I am seeing currently. She actually has 2 areas 1 on the left distal/posterior lower leg and the other on the right lower leg. Subsequently the measurements are roughly the same may be slightly smaller but in general have not made any trend towards getting worse which is great news. 06/23/2021 upon evaluation today patient appears to be doing pretty well in regard to her wounds. On the right she is having difficulty with sciatic pain and this subsequently has led to her taking the wrap off over the last week her leg is more swollen she is also been on prednisone for 10 days. With that being said I think we may want to just use a compression sock on the left that way she will not have to worry about the compression wrap being in place that she cannot get off. With that being said I do believe as well that the patient is going to need to continue with the wrapping on the left we will also need to do a little bit of sharp debridement today. 8/24; patient presents for 1 week follow-up. She has no issues or complaints today. She denies signs of infection. 8/31; patient presents for 1 week follow-up. She has no issues or complaints today. She has tolerated the compression wrap well on the left lower extremity. She denies signs of  infection. 9/7; patient presents for follow-up. She reports that the compression wrap rolled down slightly on her right lower extremity. She reports tenderness to the wound bed. She denies increased warmth or erythema to the surrounding skin. She overall feels well. 9/14; patient presents for follow-up. She has no issues or complaints today. She denies signs of infection. PuraPly is available and  patient would like to start this today. Electronic Signature(s) Signed: 07/19/2021 2:28:04 PM By: Kalman Shan DO Entered By: Kalman Shan on 07/19/2021 14:24:24 [name] -------------------------------------------------------------------------------- Physical Exam Details Patient Name: ERIONNA, STARNER. Date of Service: 07/19/2021 1:15 PM Medical Record Number: [id number] Patient Account Number: [id number] Date of Birth/Sex: Feb 05, 1946 (75 y.o. F) Treating RN: [name] Primary Care Provider: [name] Other Clinician: Referring Provider: [name] Treating Provider/Extender: Yaakov Guthrie in Treatment: 14 Constitutional . Cardiovascular . Psychiatric . Notes Left lower extremity: To the distal medial aspect there is an open wound with nonviable tissue and some scant granulation tissue present. Post debridement there was granulation tissue present. No signs of infection. Electronic Signature(s) Signed: 07/19/2021 2:28:04 PM By: Kalman Shan DO Entered By: Kalman Shan on 07/19/2021 14:25:27 [name] -------------------------------------------------------------------------------- Physician Orders Details Patient Name: AYANNAH, GRUNERT. Date of Service: 07/19/2021 1:15 PM Medical Record Number: [id number] Patient Account Number: [id number] Date of Birth/Sex: [name] 22, 1946 (75 y.o. F) Treating RN: [name] Primary Care Provider: [name] Other Clinician: Referring Provider: [name] Treating  Provider/Extender: Yaakov Guthrie in Treatment: 51 Verbal / Phone Orders: No Diagnosis Coding Follow-up Appointments Wound #13 Left,Distal,Posterior Lower Leg o Return Appointment in 1 week. - Puraply #2 on 07/26/21 o Nurse Visit as needed Bathing/ Shower/ Hygiene o May shower with wound dressing protected with water repellent cover or cast protector. o No tub bath. Anesthetic (Use 'Patient Medications' Section for Anesthetic Order Entry) o Lidocaine applied to wound bed Cellular or Tissue Based Products o Cellular or Tissue Based Product applied to wound bed; including contact layer, fixation with steri-strips, dry gauze and cover dressing. (DO NOT REMOVE). - Puraply #1 applied 07/19/21 Edema Control - Lymphedema / Segmental Compressive Device / Other o Optional: One layer of unna paste to top of compression wrap (to act as an anchor). o Patient to wear own compression stockings. Remove compression stockings every night before going to bed and put on every morning when getting up. - right leg o Elevate, Exercise Daily and Avoid Standing for Long Periods of Time. o Elevate legs to the level of the heart and pump ankles as often as possible o Elevate leg(s) parallel to the floor when sitting. o Compression Pump: Use compression pump on left lower extremity for 60 minutes, twice daily. - TWICE DAILY for 1 hour o Compression Pump: Use compression pump on right lower extremity for 60 minutes, twice daily. - TWICE DAILY for 1 hour Wound Treatment Wound #13 - Lower Leg Wound Laterality: Left, Posterior, Distal Cleanser: Soap and Water 2 x Per Week/30 Days Discharge Instructions: Gently cleanse wound with antibacterial soap, rinse and pat dry prior to dressing wounds Secondary Dressing: ABD Pad 5x9 (in/in) 2 x Per Week/30 Days Discharge Instructions: Cover with ABD pad Compression Wrap: Medichoice 4 layer Compression System, 35-40 mmHG 2 x Per Week/30  Days Discharge Instructions: Apply multi-layer wrap as directed. Electronic Signature(s) Signed: 07/19/2021 2:28:04 PM By: Kalman Shan DO Signed: 07/19/2021 3:44:27 PM By: [name] Entered By: [name] on 07/19/2021 14:24:50 Renovato, [name] -------------------------------------------------------------------------------- Problem List Details Patient Name: LILYONA, SPIGHT. Date of Service: 07/19/2021 1:15 PM Medical Record Number: [id number] Patient Account Number: [id number] Date of Birth/Sex: 1946-07-30 (75 y.o. F) Treating RN: [name] Primary Care Provider: [name] Other Clinician: Referring Provider: [name] Treating Provider/Extender: Yaakov Guthrie in Treatment: 14 Active Problems ICD-10 Encounter Code Description Active Date MDM Diagnosis I87.332 Chronic venous hypertension (  idiopathic) with ulcer and inflammation of 04/12/2021 No Yes left lower extremity I89.0 Lymphedema, not elsewhere classified 04/12/2021 No Yes L97.221 Non-pressure chronic ulcer of left calf limited to breakdown of skin 04/12/2021 No Yes Inactive Problems Resolved Problems ICD-10 Code Description Active Date Resolved Date S81.801A Unspecified open wound, right lower leg, initial encounter 05/31/2021 05/31/2021 Electronic Signature(s) Signed: 07/19/2021 2:28:04 PM By: Kalman Shan DO Entered By: Kalman Shan on 07/19/2021 14:23:33 Kerby, [name] -------------------------------------------------------------------------------- Progress Note/History and Physical Details Patient Name: VALETA, JAREMA. Date of Service: 07/19/2021 1:15 PM Medical Record Number: [id number] Patient Account Number: [id number] Date of Birth/Sex: [birth year] (75 y.o. F) Treating RN: [name] Primary Care Provider: [name] Other Clinician: Referring Provider: [name] Treating Provider/Extender: Yaakov Guthrie in Treatment:  14 Subjective Chief Complaint Information obtained from Patient Left calf venous stasis ulceration. 11/13/16; the patient is here for a left calf recurrent ulceration which is painful and has been present for the last month 01/22/17; the patient re-presents here for recurrent difficulties with blistering and leaking fluid on her left posterior calf 12/10/18; the patient returns to clinic for a wound on the left medial calf 04/12/2021; left lower extremity wound, 05/31/21; right leg wound History of Present Illness (HPI) Pleasant 75 year old with history of chronic venous insufficiency. No diabetes or peripheral vascular disease. Left ABI 1.29. Questionable history of left lower extremity DVT. She developed a recurrent ulceration on her left lateral calf in December 2015, which she attributes to poor diet and subsequent lower extremity edema. She underwent endovenous laser ablation of her left greater saphenous vein in 2010. She underwent laser ablation of accessory branch of left GSV in April 2016 by Dr. Kellie Simmering at [facility] She was previously wearing Unna boots, which she tolerated well. Tolerating 2 layer compression and cadexomer iodine. She returns to clinic for follow-up and is without new complaints. She denies any significant pain at this time. She reports persistent pain with pressure. No claudication or ischemic rest pain. No fever or chills. No drainage. READMISSION 11/13/16; this is a 75 year old woman who is not a diabetic. She is here for a review of a painful area on her left medial lower extremity. I note that she was seen here previously last year for wound I believe to be in the same area. At that time she had undergone previously a left greater saphenous vein ablation by Dr. Kellie Simmering and she had a ablation of the anterior accessory branch of the left greater saphenous vein in March 2016. Seeing that the wound actually closed over. In reviewing the history with her today the ulcer in this  area has been recurrent. She describes a biopsy of this area in 2009 that only showed stasis physiology. She also has a history of today malignant melanoma in the right shoulder for which she follows with Dr. 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 Do not see that she has ever had arterial studies in the left leg. She tells me she has a follow-up with Dr. Kellie Simmering in roughly 10 days In any case she developed the reopening of this area roughly a month ago. On the background of this she describes rapidly increasing edema which has responded to Lasix 40 mg and metolazone 2.5 mg as well as the patient's lymph massage. She has been told she has both venous insufficiency and lymphedema but she cannot tolerate  compression stockings 11/28/16; the patient saw Dr. Kellie Simmering recently. Per the patient he did arterial Dopplers in the office that did not show evidence of arterial insufficiency, per the patient he stated "treat this like an ordinary venous ulcer". She also saw her dermatologist Dr. Ronnald Ramp who felt that this was more of a vascular ulcer. In general things are improving although she arrives today with increasing bilateral lower extremity edema with weeping a deeper fluid through the wound on the left medial leg compatible with some degree of lymphedema 12/04/16; the patient's wound is fully epithelialized but I don't think fully healed. We will do another week of depression with Promogran and TCA however I suspect we'll be able to discharge her next week. This is a very unusual-looking wound which was initially a figure-of-eight type wound lying on its side surrounded by petechial like hemorrhage. She has had venous ablation on this side. She apparently does not have an arterial issue per Dr. Kellie Simmering. She saw her dermatologist thought it was "vascular". Patient is definitely going to need  ongoing compression and I talked about this with her today she will go to elastic therapy after she leaves here next week 12/11/16; the patient's wound is not completely closed today. She has surrounding scar tissue and in further discussion with the patient it would appear that she had ulcers in this area in 2009 for a prolonged period of time ultimately requiring a punch biopsy of this area that only showed venous insufficiency. I did not previously pickup on this part of the history from the patient. 12/18/16; the patient's wound is completely epithelialized. There is no open area here. She has significant bilateral venous insufficiency with secondary lymphedema to a mild-to-moderate degree she does not have compression stockings.. She did not say anything to me when I was in the room, she told our intake nurse that she was still having pain in this area. This isn't unusual recurrent small open area. She is going to go to elastic therapy to obtain compression stockings. 12/25/16; the patient's wound is fully epithelialized. There is no open area here. The patient describes some continued episodic discomfort in this area medial left calf. However everything looks fine and healed here. She is been to elastic therapy and caught herself 15-20 mmHg stockings, they apparently were having trouble getting 20-30 mm stockings in her size 01/22/17; this is a patient we discharged from the clinic a month ago. She has a recurrent open wound on her medial left calf. She had 15 mm support stockings. I told her I thought she needed 20-30 mm compression stockings. She tells me that she has been ill with hospitalization secondary to asthma and is been found to have severe hypokalemia likely secondary to a combination of Lasix and metolazone. This morning she noted blistering and leaking fluid on the posterior part of her left leg. She called our intake nurse urgently and we was saw her this afternoon. She has not had  any real discomfort here. I don't know that she's been wearing any stockings on this leg for at least 2-3 days. ABIs in this clinic were 1.21 on the right and 1.3 on the left. She is previously seen vascular surgery who does not think that there is a peripheral arterial issue. 01/30/17; Patient arrives with no open wound on the left leg. She has been to elastic therapy and obtained 20-79mhg below knee stockings and she has one on the right leg today. READMISSION 02/19/18; this PRainesis a now  75 year old patient we've had in this clinic perhaps 3 times before. I had last looked at her from January 07 December 2016 with an area on the medial left leg. We discharged her on 12/25/16 however she had to be readmitted on 01/22/17 with a recurrence. I have in my notes that we discharged her on 20-30 mm stockings although she tells me she was only wearing support hose because she cannot get stockings on predominantly related to her cervical spine surgery/issues. She has had previous ablations done by vein and vascular in [place] including a great saphenous vein ablation on the left with an anterior accessory branch ablation I think both of these were in 2016. On one of the previous visit she had a biopsy noted 2009 that was negative. She is not felt to have an arterial issue. She is not a diabetic. She does have a DELANEY, SEELE. ([id number]) history of obstructive sleep apnea hypertension asthma as well as chronic venous insufficiency and lymphedema. On this occasion she noted 2 dry scaly patch on her left leg. She tried to put lotion on this it didn't really help. There were 2 open areas.the patient has been seeing her primary physician from 02/05/18 through 02/14/18. She had Unna boots applied. The superior wound now on the lateral left leg has closed but she's had one wound that remains open on the lateral left leg. This is not the same spot as we dealt with in 2018. ABIs in this clinic were 1.3  bilaterally 02/26/18; patient has a small wound on the left lateral calf. Dimensions are down. She has chronic venous insufficiency and lymphedema. 03/05/18; small open area on the left lateral calf. Dimensions are down. Tightly adherent necrotic debris over the surface of the wound which was difficult to remove. Also the dressing [over collagen] stuck to the wound surface. This was removed with some difficulty as well. Change the primary dressing to Hydrofera Blue ready 03/12/18; small open area on the left lateral calf. Comes in with tightly adherent surface eschar as well as some adherent Hydrofera Blue. 03/19/18; open area on the left lateral calf. Again adherent surface eschar as well as some adherent Hydrofera Blue nonviable subcutaneous tissue. She complained of pain all week even with the reduction from 4-3 layer compression I put on last week. Also she had an increase in her ankle and calf measurements probably related to the same thing. 03/26/18; open area on the left lateral calf. A very small open area remains here. We used silver alginate starting last week as the Hydrofera Blue seem to stick to the wound bed. In using 4-layer compression 04/02/18; the open area in the left lateral calf at some adherent slough which I removed there is no open area here. We are able to transition her into her own compression stocking. Truthfully I think this is probably his support hose. However this does not maintain skin integrity will be limited. She cannot put over the toe compression stockings on because of neck problems hand problems etc. She is allergic to the lining layer of juxta lites. We might be forced to use extremitease stocking should this fail READMIT 11/24/2018 Patient is now a 75 year old woman who is not a diabetic. She has been in this clinic on at least 3 previous occasions largely with recurrent wounds on her left leg secondary to chronic venous insufficiency with secondary lymphedema. Her  situation is complicated by inability to get stockings on and an allergy to neoprene which is apparently a  component and at least juxta lites and other stockings. As a result she really has not been wearing any stockings on her legs. She tells [place] that roughly 2 or 3 weeks ago she started noticing a stinging sensation just above her ankle on the left medial aspect. She has been diagnosed with pseudogout and she wondered whether this was what she was experiencing. She tried to dress this with something she bought at the store however subsequently it pulled skin off and now she has an open wound that is not improving. She has been using Vaseline gauze with a cover bandage. She saw her primary doctor last week who put an Haematologist on her. ABIs in this clinic was 1.03 on the left 2/12; the area is on the left medial ankle. Odd-looking wound with what looks to be surface epithelialization but a multitude of small petechial openings. This clearly not closed yet. We have been using silver alginate under 3 layer compression with TCA 2/19; the wound area did not look quite as good this week. Necrotic debris over the majority of the wound surface which required debridement. She continues to have a multitude of what looked to be small petechial openings. She reminds [place] that she had a biopsy on this initially during her first outbreak in 2015 in [place] [facility]. She expresses concern about this being a possible melanoma. 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 I assured her that this does not look anything like melanoma. Except for the petechial reaction it does look like a venous insufficiency area and she certainly has evidence of this on both sides 2/26; a difficult area on the left medial ankle. The patient clearly has chronic venous hypertension with some degree of lymphedema. The odd thing about the area is  the small petechial hemorrhages. I am not really sure how to explain this. This was present last time and this is not a compression injury. We have been using Hydrofera Blue which I changed to last week 3/4; still using Hydrofera Blue. Aggressive debridement today. She does not have known arterial issues. She has seen Dr. Kellie Simmering at [facility] vein [facility] vascular and and has an ablation on the left. [Anterior accessory branch of the greater saphenous]. From what I remember they did not feel she had an arterial issue. The patient has had this area biopsied in 2009 at [facility] dermatology and by her recollection they said this was "stasis". She is also follow-up with dermatology locally who thought that this was more of a vascular issue 3/11; using Hydrofera Blue. Aggressive debridement today. She does not have an arterial issue. We are using 3 layer compression although we may need to go to 4. The patient has been in for multiple changes to her wrap since I last saw her a week ago. She says that the area was leaking. I do not have too much more information on what was found 01/19/19 on evaluation today patient was actually being seen for a nurse visit when unfortunately she had the area on her left lateral lower extremity as well as weeping from the right lower extremity that became apparent. Therefore we did end up actually seeing her for a full visit with myself. She is having some pain at this site as well but fortunately nothing too significant at this point. No fevers, chills, nausea, or vomiting noted at this time. 3/18-Patient is back to the clinic with the  left leg venous leg ulcer, the ulcer is larger in size, has a surface that is densely adherent with fibrinous tissue, the Hydrofera Blue was used but is densely adherent and there was difficulty in removing it. The right lower extremity was also wrapped for weeping edema. Patient has a new area over the left lateral foot above the malleolus that  is small and appears to have no debris with intact surrounding skin. Patient is on increased dose of Lasix also as a means to edema management 3/25; the patient has a nonhealing venous ulcer on the medial left leg and last week developed a smaller area on the lateral left calf. We have been using Hydrofera Blue with a contact layer. 4/1; no major change in these wounds areas. Left medial and more recently left lateral calf. I tried Iodoflex last week to aid in debridement she did not tolerate this. She stated her pain was terrible all week. She took the top layer of the 4 layer compression off. 4/8; the patient actually looks somewhat better in terms of her more prominent left lateral calf wound. There is some healthy looking tissue here. She is still complaining of a lot of discomfort. 4/15; patient in a lot of pain secondary to sciatica. She is on a prednisone taper prescribed by her primary physician. She has the 2 areas one on the left medial and more recently a smaller area on the left lateral calf. Both of these just above the malleoli 4/22; her back pain is better but she still states she is very uncomfortable and now feels she is intolerant to the [employer]. No real change in the wounds we have been using Sorbact. She has been previously intolerant to Iodoflex. There is not a lot of option about what we can use to debride this wound under compression that she no doubt needs. sHe states Ultram no longer works for her pain 4/29; no major change in the wounds slightly increased depth. Surface on the original medial wound perhaps somewhat improved however the more recent area on the lateral left ankle is 100% covered in very adherent debris we have been using Sorbact. She tolerates 4 layer compression well and her edema control is a lot better. She has not had to come in for a nurse check 5/6; no major change in the condition of the wounds. She did consent to debridement today which was done with  some difficulty. Continuing Sorbact. She did not tolerate Iodoflex. She was in for a check of her compression the day after we wrapped her last week this was adjusted but NIKKITA, ESHLEMAN. ([id number]) nothing much was found 5/13; no major change in the condition or area of the wounds. I was able to get a fairly aggressive debridement done on the lateral left leg wound. Even using Sorbact under compression. She came back in on Friday to have the wrap changed. She says she felt uncomfortable on the lateral aspect of her ankle. She has a long history of chronic venous insufficiency including previous ablation surgery on this side. 5/20-Patient returns for wounds on left leg with both wounds covered in slough, with the lateral leg wound larger in size, she has been in 3 layer compression and felt more comfortable, she describes pain in ankle, in leg and pins and needles in foot, and is about to try Pamelor for this 6/3; wounds on the left lateral and left medial leg. The area medially which is the most recent of the 2 seems  to have had the largest increase in dimensions. We have been using Sorbac to try and debride the surface. She has been to see orthopedics they apparently did a plain x-ray that was indeterminant. Diagnosed her with neuropathy and they have ordered an MRI to determine if there is underlying osteomyelitis. This was not high on my thought list but I suppose it is prudent. We have advised her to make an appointment with vein and vascular in [place]. She has a history of a left greater saphenous and accessory vein ablations I wonder if there is anything else that can be done from a surgical point of view to help in these difficult refractory wounds. We have previously healed this wound on one occasion but it keeps on reopening [medial side] 6/10; deep tissue culture I did last week I think on the left medial wound showed both moderate E. coli and moderate staph aureus [MSSA]. She is  going to require antibiotics and I have chosen Augmentin. We have been using Sorbact and we have made better looking wound surface on both sides but certainly no improvement in wound area. She was back in last Friday apparently for a dressing changes the wrap was hurting her outer left ankle. She has not managed to get a hold of vein and vascular in [place]. We are going to have to make her that appointment 6/17; patient is tolerating the Augmentin. She had an MRI that I think was ordered by orthopedic surgeon this did not show osteomyelitis or an abscess did suggest cellulitis. We have been using Sorbact to the lateral and medial ankles. We have been trying to arrange a follow-up appointment with vein and vascular in [place] or did her original ablations. We apparently an area sent the request to vein and vascular in [facility] 6/24; patient has completed the Augmentin. We do not yet have a vein and vascular appointment in [place]. I am not sure what the issue is here we have asked her to call tomorrow. We are using Sorbact. Making some improvements and especially the medial wound. Both surfaces however look better medial and lateral. 7/1; the patient has been in contact with vein and vascular in [place] but has not yet received an appointment. Using Sorbact we have gradually improve the wound surface with no improvement in surface area. She is approved for Apligraf but the wound surface still is not completely viable. She has not had to come in for a dressing change 7/8; the patient has an appointment with vein and vascular on 7/31 which is a Friday afternoon. She is concerned about getting back here for [place] to dress her wounds. I think it is important to have them goal for her venous reflux/history of ablations etc. to see if anything else can be done. She apparently tested positive for 1 of the blood tests with regards to lupus and saw a rheumatologist. He has raised the issue of  vasculitis again. I have had this thought in the past however the evidence seems overwhelming that this is a venous reflux etiology. If the rheumatologist tells me there is clinical and laboratory investigation is positive for lupus I will rethink this. 7/15; the patient's wound surfaces are quite a bit better. The medial area which was her original wound now has no depth although the lateral wound which was the more recent area actually appears larger. Both with viable surfaces which is indeed better. Using Sorbact. I wanted to use Apligraf on her however there is the issue of  the vein and vascular appointment on 7/31 at 2:00 in the afternoon which would not allow her to get back to be rewrapped and they would no doubt remove the graft 7/22; the patient's wound surfaces have moderate amount of debris although generally look better. The lateral one is larger with 2 small satellite areas superiorly. We are waiting for her vein and vascular appointment on 7/31. She has been approved for Apligraf which I would like to use after th 7/29; wound surfaces have improved no debridement is required we have been using Sorbact. She sees vein and vascular on Friday with this so question of whether anything can be done to lessen the likelihood of recurrence and/or speed the healing of these areas. She is already had previous ablations. She no doubt has severe venous hypertension 8/5-Patient returns at 1 week, she was in [place] for 3 days by her podiatrist, we have been using so backed to the wound, she has increased pain in both the wounds on the left lower leg especially the more distal one on the lateral aspect 8/12-Patient returns at 1 week and she is agreeable to having debridement in both wounds on her left leg today. We have been using Sorbact, and vascular studies were reviewed at last visit 8/19; the patient arrives with her wounds fairly clean and no debridement is required. We have used Sorbact which  is really done a nice job in cleaning up these very difficult wound surfaces. The patient saw Dr. Donzetta Matters of vascular surgery on 7/31. He did not feel that there was an arterial component. He felt that her treated greater saphenous vein is adequately addressed and that the small saphenous vein did not appear to be involved significantly. She was also noted to have deep venous reflux which is not treatable. Dr. Donzetta Matters mentioned the possibility of a central obstructive component leading to reflux and he offered her central venography. She wanted to discuss this or think about it. I have urged her to go ahead with this. She has had recurrent difficult wounds in these areas which do heal but after months in the clinic. If there is anything that can be done to reduce the likelihood of this I think it is worth it. 9/2 she is still working towards getting follow-up with Dr. Donzetta Matters to schedule her CT. Things are quite a bit worse venography. I put Apligraf on 2 weeks ago on both wounds on the medial and lateral part of her left lower leg. She arrives in clinic today with 3 superficial additional wounds above the area laterally and one below the wound medially. She describes a lot of discomfort. I think these are probably wrapped injuries. Does not look like she has cellulitis. 07/20/2019 on evaluation today patient appears to be doing somewhat poorly in regard to her lower extremity ulcers. She in fact showed signs of erythema in fact we may even be dealing with an infection at this time. Unfortunately I am unsure if this is just infection or if indeed there may be some allergic reaction that occurred as a result of the Apligraf application. With that being said that would be unusual but nonetheless not impossible in this patient is one who is unfortunately allergic to quite a bit. Currently we have been using the Sorbact which seems to do as well as anything for her. I do think we may want to obtain a culture today  to see if there is anything showing up there that may need to be  addressed. 9/16; noted that last week the wounds look worse in 1 week follow-up of the Apligraf. Using Sorbact as of 2 days ago. She arrives with copious amounts of drainage and new skin breakdown on the back of the left calf. The wounds arm more substantial bilaterally. There is a fair amount of swelling in the left calf no overt DVT there is edema present I think in the left greater than right thigh. She is supposed to go on 9/28 for CT venography. The wounds on the medial and lateral calf are worse and she has new skin breakdown posteriorly at least new for me. This is almost developing into a circumferential wound area The Apligraf was taken off last week which I agree with things are not going in the right direction a culture was done we do not have that back yet. She is on Augmentin that she started 2 days ago 9/23; dressing was changed by her nurses on Monday. In general there is no improvement in the wound areas although the area looks less angry than last week. She did get Augmentin for MSSA cultured on the 14th. She still appears to have too much swelling in the left leg even with 3 layer compression 9/30; the patient underwent her procedure on 9/28 by Dr. Donzetta Matters at vascular and vein specialist. She was discovered to have the common iliac vein measuring 12.2 mm but at the level of L4-L5 measured 3 mm. After stenting it measured 10 mm. 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 Rouleaux flow in the common femoral and femoral vein was observed much improved after stenting. We are using silver alginate to the wounds on the medial and lateral ankle on the left. 4 layer compression DONSHAY, WIBLE ([id number]) 10/7; the patient had fluid swelling around her knee and 4 layer compression. At the advice of vein and vascular this was reduced to 3 layer which she is tolerating better. We have been using silver alginate  under 3 layer compression since last Friday 10/14; arrives with the areas on the left ankle looking a lot better. Inflammation in the area also a lot better. She came in for a nurse check on 10/9 10/21; continued nice improvement. Slight improvements in surface area of both the medial and lateral wounds on the left. A lot of the satellite lesions in the weeping erythema around these from stasis dermatitis is resolved. We have been using silver alginate 10/28; general improvement in the entire wound areas although not a lot of change in dimensions the wound certainly looks better. There is a lot less in terms of venous inflammation. Continue silver alginate this week however look towards Hydrofera Blue next week 11/4; very adherent debris on the medial wound left wound is not as bad. We have been using silver alginate. Change to [facility] today 11/11; very adherent debris on both wound areas. She went to vein and vascular last week and follow-up they put in Nuevo boot on this today. He says the [facility] was adherent. Wound is definitely not as good as last week. Especially on the left there the satellite lesions look more prominent 11/18; absolutely no better. erythema on lateral aspect with tenderness. 09/30/2019 on evaluation today patient appears to actually be doing better. Dr. Dellia Nims did put her on doxycycline last week which I do believe has helped her at this point. Fortunately there is no signs of active infection at this time. No fevers, chills, nausea, vomiting, or diarrhea. I  do believe he may want extend the doxycycline for 7 additional days just to ensure everything does completely cleared up the patient is in agreement with that plan. Otherwise she is going require some sharp debridement today 12/2; patient is completing a 2-week course of doxycycline. I gave her this empirically for inflammation as well as infection when I last saw her 2 weeks ago. All of this seems to be  better. She is using silver alginate she has the area on the medial aspect of the larger area laterally and the 2 small satellite regions laterally above the major wound. 12/9; the patient's wound on the left medial and left lateral calf look really quite good. We have been using silver alginate. She saw vein and vascular in follow-up on 10/09/2019. She has had a previous left greater saphenous vein ablation by Dr. Oscar La in 2016. More recently she underwent a left common iliac vein stent by Dr. 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 The swelling is improved and certainly the wounds have improved. The patient shows [place] today area on the right medial calf there is almost no wound but leaking lymphedema. She says she start this started 3 or 4 days ago. She did not traumatize it. It is not painful. She does not wear compression on that side 12/16; the patient continues to do well laterally. Medially still requiring debridement. The area on the right calf did not materialize to anything and is not currently open. We wrapped this last time. She has support stockings for that leg although I am not sure they are going to provide adequate compression 12/23; the lateral wound looks stable. Medially still requiring debridement for tightly adherent fibrinous debris. We've been using silver alginate. Surface area not any different 12/30; neither wound is any better with regards to surface and the area on the left lateral is larger. I been using silver alginate to the left lateral which look quite good last week and Sorbact to the left medial 11/11/2019. Lateral wound area actually looks better and somewhat smaller. Medial still requires a very aggressive debridement today. We have been using Sorbact on both wound areas 1/13; not much better still adherent debris bilaterally. I been using Sorbact. She has severe venous hypertension. Probably some degree of dermal fibrosis distally. I wonder whether  tighter compression might help and I am going to try that today. We also need to work on the bioburden 1/20; using Sorbact. She has severe venous hypertension status post stent placement for pelvic vein compression. We applied gentamicin last time to see if we could reduce bioburden I had some discussion with her today about the use of pentoxifylline. This is occasionally used in this setting for wounds with refractory venous insufficiency. However this interacts with Plavix. She tells me that she was put on this after stent placement for 3 months. She will call Dr. Claretha Cooper office to discuss 1/27; we are using gentamicin under Sorbact. 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 She has a stent. Wound medially is measuring smaller this week. Laterally measuring slightly larger although she has some satellite lesions superiorly 2/3; gentamicin under Sorbact under 4-layer compression. 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 She has a stent on Plavix. Her wounds are measuring smaller this week. More substantially laterally where there is a satellite lesion superiorly. 2/10; gentamicin under Sorbac. 4-layer compression. Patient communicated with Dr. Donzetta Matters at vein and vascular in [place]. He is okay with the patient coming  off Plavix I will therefore start her on pentoxifylline for a 1 month trial. In general her wounds look better today. I had some concerns about swelling in the left thigh however she measures 61.5 on the right and 63 on the mid thigh which does not suggest there is any difficulty. The patient is not describing any pain. 2/17; gentamicin under Sorbac 4-layer compression. She has been on pentoxifylline for 1 week and complains of loose stool. No nausea she is eating and drinking well 2/24; the patient apparently came in 2 days ago for a nurse visit when her wrap fell down. Both areas look a little worse this week macerated medially  and satellite lesions laterally. Change to silver alginate today 3/3; wounds are larger today especially medially. She also has more swelling in her foot lower leg and I even noted some swelling in her posterior thigh which is tender. I wonder about the patency of her stent. Fortuitously she sees Dr. Claretha Cooper group on Friday 3/10; Mrs. Moyle was seen by vein and vascular on 3/5. The patient underwent ultrasound. There was no evidence of thrombosis involving the IVC no evidence of thrombosis involving the right common iliac vein there is no evidence of thrombosis involving the right external iliac vein the left external vein is also patent. The right common iliac vein stent appears patent bilateral common femoral veins are compressible and appear patent. I was concerned about the left common iliac stent however it looks like this is functional. She has some edema in the posterior thigh that was tender she still has that this week. I also note they had trouble finding the pulses in her left foot and booked her for an ABI baseline in 4 weeks. She will follow up in 6 months for repeat IVC duplex. The patient stopped the pentoxifylline because of diarrhea. It does not look like that was being effective in any case. I have advised her to go back on her aspirin 81 mg tablet, vascular it also suggested this 3/17; comes in today with her wound surfaces a lot better. The excoriations from last week considerably better probably secondary to the TCA. We have been using silver alginate 3/24; comes in today with smaller wounds both medially and laterally. Both required debridement. There are 2 small satellite areas superiorly laterally. She also has a very odd bandlike area in the mid calf almost looking like there was a weakness in the wrap in a localized area. I would write this off as being this however anteriorly she has a small raised ballotable area that is very tender almost reminiscent of an abscess but  there was no SHAVANDA, MALISZEWSKI. ([id number]) obvious purulent surface to it. 02/04/20 upon evaluation today patient appears to be doing fairly well in regard to her wounds today. Fortunately there is no signs of active infection at this time. No fevers, chills, nausea, vomiting, or diarrhea. She has been tolerating the dressing changes without complication. Fortunately I feel like she is showing signs of improvement although has been sometime since have seen her. Nonetheless the area of concern that Dr. Dellia Nims had last week where she had possibly an area of the wrap that was we can allow the leg to bulge appears to be doing significantly better today there is no signs of anything worsening. 4/7; the patient's wounds on her medial and lateral left leg continue to contract. We have been using a regular alginate. Last week she developed an area on the right medial lower leg  which is probably a venous ulcer as well. 4/14; the wounds on her left medial and lateral lower leg continue to contract. Surface eschar. We have been using regular alginate. The area on the right medial lower leg is closed. We have been putting both legs under 4-layer contraction. The patient went back to see vein and vascular she had arterial studies done which were apparently "quite good" per the patient although I have not read their notes I have never felt she had an arterial issue. 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 This is been longstanding and refractory to exercise, leg elevation and longstanding use of compression wraps in our clinic as well as compression stockings on the times we have been able to get these to heal 4/21; we thought she actually might be close this week however she arrives in clinic with a lot of edema in her upper left calf and into her posterior thigh. This is been an intermittent problem here. She says the wrap fell down but it was replaced with a nurse  visit on Monday. We are using calcium alginate to the wounds and the wound sizes there not terribly larger than last week but there is a lot more edema 4/28; again wound edges are smaller on both sides. Her edema is better controlled than last time. She is obtained her compression pumps from [employer] although they have not been to her home to set these up. 5/5; left medial and left lateral both look stable. I am not sure the medial is any smaller. We have been using calcium alginate under 4-layer compression. She had an area on the right medial. This was eschared today. We have been wrapping this as well. She does not tolerate external compression stockings due to a history of various contact allergies. She has her compression pumps however the representative from the company is coming on her to show her how to use these tomorrow 5/19; patient with severe chronic venous insufficiency secondary to central venous disease. She had a stent placed in her left common iliac vein. She has done better since but still difficult to control wounds. She comes in today with nothing open on the right leg. Her areas on the left medial and left lateral are just about closed. We are using calcium alginate under 4-layer compression. She is using her external compression pumps at home She only has 15-20 support stockings. States she cannot get anything tighter than that on. 03/30/20-Patient returns at 1 week, the wounds on the left leg are both slightly bigger, the last week she was on 3 layer compression which started to slide down. She is starting to use her lymphedema pumps although she stated on 1 day her right ankle started to swell up and she have to stop that day. Unfortunately the open area seem to oscillate between improving to the point of healing and then flaring up all to do with effectiveness of compression or lack of due to the left leg topography not keeping the compression wraps from rolling  down 6/2 patient comes in with a 15/20 mmHg stocking on the right leg. She tells me that she developed a lot of swelling in her ankles she saw orthopedics she was felt to possibly be having a flare of pseudogout versus some other type of arthritis. She was put on steroids for a respiratory issue so that helps with the inflammation. She has not been using the pumps all week. She thinks the left  thigh is more swollen than usual and I would agree with that. She has an appointment with Dr. Donzetta Matters 9 days or so from now 6/9; both wounds on the left medial and left lateral are smaller. We have been using calcium alginate under compression. She does not have an open wound on the right leg she is using a stocking and her compression pumps things are going well. She has an appointment with Dr. Donzetta Matters with regards to her stent in the left common iliac vein 6/16; the wounds on the left medial and left lateral ankle continues to contract. The patient saw Dr. Donzetta Matters and I think he seems satisfied. Ordered follow-up venous reflux studies on both sides in September. Cautioned that she may need thigh-high stockings. She has been using calcium alginate under compression on the left and her own stocking on the right leg. She tells [place] there are no open wounds on the right 6/23; left lateral is just about closed. Medial required debridement today. We have been using calcium alginate. Extensive discussion about the compression pumps she is only using these on 25 mmHg states she could not take 40 or 30 when the wrap came out to her home to demonstrate these. He said they should not feel tight 6/30; the left lateral wound has a slight amount of eschar. . The area medially is about the same using Hydrofera Blue. 7/7; left lateral wound still has some eschar. I will remove this next week may be closed. The area medially is very small using Hydrofera Blue with improvement. Unfortunately the stockings fell down. Unfortunately the  blisters have developed at the edge of where the wrap fell. When this happened she says her legs hurt she did not use her pumps. We are not open Monday for her to come in and change the wraps and she had an appointment yesterday. She also tells me that she is going to have an MRI of her back. She is having pain radiating into her left anterior leg she thinks her from an L5 disc. She saw Dr. Ellene Route of neurosurgery 7/14; the area on the left lateral ankle area is closed. Still a small area medially however it looks better as well. We have been using Hydrofera Blue under 4-layer compression 7/21; left lateral ankle is still closed however her wound on the medial left calf is actually larger. This is probably because Hydrofera Blue got stuck to the wound. She came in for a nurse change on Friday and will do that again this week I was concerned about the amount of swelling that she had last week however she is using her compression pumps twice a day and the swelling seems well controlled 7/28; remaining wound on the left medial lower leg is smaller. We have been using moistened silver collagen under compression she is coming back for a nurse visit. For reasons that were not really clear she was just keeping her legs elevated and not using her compression pumps. I have asked her to use the compression pumps. She does not have any wounds on the right leg 06/15/20-Patient returns at 2 weeks, her LLE edema is worse and she developed a blister wound that is new and has bigger posterior calf wound on right, we are using Prisma with pad, 4 layer compression. she has been on lasix 40 mg daily 8/18; patient arrives today with things a lot worse than I remember from a few weeks ago. She was seen last week. Noted that her edema was  worse and that she now had a left lateral wound as well as deteriorating edema in the medial and posterior part of the lower leg. She says she is using his or her external compression  pumps once a day although I wonder about the compliance. 8/25; weeping area on the right medial lower leg. This had actually gotten a small localized area of her compression stocking wet. On the left side there is a large denuded area on the posterior medial lower leg and smaller area on the lateral. This was not the original areas that we dealt with. 9/1 the patient's wound on the left leg include the left lateral and left posterior. Larger superficial wounds weeping. She has very poor edema control. Tender localized edema in the left lower medial ankle/heel probably because of localized wrap issues. She freely admits she is not using Jenny, [place] ([id number]) the compression pumps. She has been up on her feet a lot. She thinks the hydrofera blue is contributing to the pain she is experiencing.. This is a complaint that I have occasionally heard 9/8; really not much improvement. The patient is still complaining of a lot of pain particularly when she uses compression pumps. I switched her to silver alginate last time because she found the Hydrofera Blue to be irritating. I don't hear much difference in her description with the silver alginate. 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 She has follow-up with Dr. Donzetta Matters I think for ultrasound next month 9/15; quite a bit of improvement today. We have less edema and more epithelialization in both of her wound areas on the left medial and left lateral calf. These are not the site of her original wounds in this area. She says she has been using her compression pumps for 30 minutes twice a day, there pain issues that never quite understood. Silver alginate as the primary dressing 9/22; continued improvement. Both areas medially and laterally still have a small open area there is some eschar. She continues to complain of left medial ankle pain. Swelling in the leg is in much  better condition. We have been using silver alginate 9/29; continued improvement. Both areas medially and laterally in the left calf look as though they are close some minor surface eschar but I think this is epithelialized. She comes in today saying she has a ruptured disc at L4-L5 cannot bend over to put on her stockings. 10/6; patient comes in today with no open wounds on either leg. However her edema on the left leg in the upper one third of the lower leg is poorly controlled nonpitting. She says that she could not use the pumps for 2 days and then she has been using the last couple of days. It is not clear to me she has been able to get her stocking on. She has back problems. Mrs. Puglia has severe chronic venous insufficiency with secondary lymphedema. Her venous insufficiency is partially centrally mediated and that she is now post stent in the left common iliac vein. [facility] Thurner's syndrome/physiology]. She follows up with them on 10/15. She wears 20/30 below-knee stockings. She is supposed to use compression pumps at home although I think her compliance about with this is been less than 100%. I have asked her to use these 3 times a day. Finally I think she has lipodermatosclerosis in the left lower leg with an inverted bottle sign. It is been  a major problem controlling the edema in the left leg. The right leg we have had wounds on but not as significant a problem is on the left READMISSION 04/12/2021 Mrs. Calcote is a 75 year old woman we know well in this clinic. She has severe chronic venous insufficiency. 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 I believe she has had bilateral greater saphenous vein ablation in the past as well. She tells me that this wound opened sometime in March. She had a fall and thinks it was initially abrasion. She developed areas she describes as little blisters on the anterior part of her leg and she saw dermatology and  was treated for methicillin staph aureus with several rounds of antibiotics. She has been using support stockings on the left leg and says this is the only thing she can get on. Her compression pump use maybe once a day she says if she did not use one she use the other. She comes in today with incredible swelling in the left leg with a wound on the left posterior calf. She has been using Neosporin to this previously a hydrocolloid. 6/15; patient arrives back for 1 week follow-up.Marland Kitchen Apparently her wrap fell down she did not call us to replace this. He has poor edema control. She only uses her compression pumps once a day 6/29; patient presents for 1 week follow-up. She has tolerated the compression wrap well. We have been using Iodoflex under the wrap. She has no issues or complaints today. 7/6; patient presents for 1 week follow-up. She states that the compression wrap rolled down her leg 4 days ago. She has been trying to keep the area covered but has no dressings at home to use. She denies signs of infection. 7/13; patient presents for 1 week follow-up. She states that her compression wrap rolled down her right leg and she called our office and had it placed a few days ago. She has been tolerating the current wrap well. She states that the Iodoflex is causing a burning sensation. She denies signs of infection. 7/27; patient presents for 1 week follow-up She has tolerated the compression wrap well with Hydrofera Blue underneath. She has now developed a wound to her right lower extremity. She reports having a culture done To this area by her dermatologist that she reports is negative. She currently denies signs of infection. 8/30; patient presents for 1 week follow-up. On the left side she has tolerated the compression wrap well with Hydrofera Blue underneath. She reports some discomfort to her right lower extremity with the 3 layer compression. She currently denies signs of infection. 06/14/2021  upon evaluation today patient's wound actually appears to be doing decently well based on what I am seeing currently. She actually has 2 areas 1 on the left distal/posterior lower leg and the other on the right lower leg. Subsequently the measurements are roughly the same may be slightly smaller but in general have not made any trend towards getting worse which is great news. 06/23/2021 upon evaluation today patient appears to be doing pretty well in regard to her wounds. On the right she is having difficulty with sciatic pain and this subsequently has led to her taking the wrap off over the last week her leg is more swollen she is also been on prednisone for 10 days. With that being said I think we may want to just use a compression sock on the left that way she will not  have to worry about the compression wrap being in place that she cannot get off. With that being said I do believe as well that the patient is going to need to continue with the wrapping on the left we will also need to do a little bit of sharp debridement today. 8/24; patient presents for 1 week follow-up. She has no issues or complaints today. She denies signs of infection. 8/31; patient presents for 1 week follow-up. She has no issues or complaints today. She has tolerated the compression wrap well on the left lower extremity. She denies signs of infection. 9/7; patient presents for follow-up. She reports that the compression wrap rolled down slightly on her right lower extremity. She reports tenderness to the wound bed. She denies increased warmth or erythema to the surrounding skin. She overall feels well. 9/14; patient presents for follow-up. She has no issues or complaints today. She denies signs of infection. PuraPly is available and patient would like to start this today. Patient History Information obtained from Patient. Family History SHNIYA, ANCEL ([id number]) Cancer - Mother,Paternal Grandparents, Hypertension -  Mother,Father, Kidney Disease - Paternal Grandparents, Lung Disease - Paternal Grandparents, No family history of Diabetes, Heart Disease, Hereditary Spherocytosis, Seizures, Stroke, Thyroid Problems, Tuberculosis. Social History Former smoker - quit at age 35 - ended on 04/19/1966, Marital Status - Divorced, Alcohol Use - Never, Drug Use - No History, Caffeine Use - Daily. Medical History Eyes Patient has history of Cataracts Denies history of Glaucoma, Optic Neuritis Ear/Nose/Mouth/Throat Denies history of Chronic sinus problems/congestion, Middle ear problems Hematologic/Lymphatic Denies history of Anemia, Hemophilia, Human Immunodeficiency Virus, Lymphedema, Sickle Cell Disease Respiratory Patient has history of Asthma, Sleep Apnea Denies history of Aspiration, Chronic Obstructive Pulmonary Disease (COPD), Pneumothorax, Tuberculosis Cardiovascular Patient has history of Deep Vein Thrombosis - LLE 2010, Hypertension, Peripheral Venous Disease Denies history of Angina, Arrhythmia, Congestive Heart Failure, Coronary Artery Disease, Hypotension, Myocardial Infarction, Peripheral Arterial Disease, Phlebitis, Vasculitis Gastrointestinal Denies history of Cirrhosis , Colitis, Crohn s, Hepatitis A, Hepatitis B, Hepatitis C Endocrine Denies history of Type I Diabetes, Type II Diabetes Genitourinary Denies history of End Stage Renal Disease Immunological Denies history of Lupus Erythematosus, Raynaud s, Scleroderma Integumentary (Skin) Denies history of History of Burn, History of pressure wounds Musculoskeletal Patient has history of Osteoarthritis - neck Denies history of Gout, Rheumatoid Arthritis, Osteomyelitis Neurologic Denies history of Dementia, Neuropathy, Quadriplegia, Paraplegia, Seizure Disorder Oncologic Patient has history of Received Chemotherapy - interfeon immunotherapy, Received Radiation Psychiatric Denies history of Anorexia/bulimia, Confinement  Anxiety Hospitalization/Surgery History - Melanoma left shoulder. Medical And Surgical History Notes Constitutional Symptoms (General Health) Vein ablation LLE 2010 Vascular Sx ( vein ablationo) LLE 02/2015 Eyes Horners syndrome Genitourinary Kidney stones Neurologic ct scan showed swollen lymph nodes Oncologic Melanoma (R) shoulder 07/2010; (R) axillary lymphnode removal Objective Constitutional Vitals Time Taken: 1:22 PM, Height: 63 in, Weight: 212 lbs, BMI: 37.6, Temperature: 98.4 F, Pulse: 76 bpm, Respiratory Rate: 16 breaths/min, Blood Pressure: 143/78 mmHg. General Notes: Left lower extremity: To the distal medial aspect there is an open wound with nonviable tissue and some scant granulation tissue present. Post debridement there was granulation tissue present. No signs of infection. Integumentary (Hair, Skin) Braziel, Indianna J. ([id number]) Wound #13 status is Open. Original cause of wound was Gradually Appeared. The date acquired was: 02/24/2021. The wound has been in treatment 14 weeks. The wound is located on the Left,Distal,Posterior Lower Leg. The wound measures 0.8cm length x 0.7cm width x 0.2cm depth; 0.44cm^2 area and  0.088cm^3 volume. There is Fat Layer (Subcutaneous Tissue) exposed. There is no tunneling or undermining noted. There is a medium amount of serosanguineous drainage noted. There is medium (34-66%) red, pink granulation within the wound bed. There is a medium (34-66%) amount of necrotic tissue within the wound bed including Adherent Slough. Assessment Active Problems ICD-10 Chronic venous hypertension (idiopathic) with ulcer and inflammation of left lower extremity Lymphedema, not elsewhere classified Non-pressure chronic ulcer of left calf limited to breakdown of skin Patient's wound has shown improvement since last clinic visit. I debrided nonviable tissue and patient had a nice clean healthy surface present. No signs of infection. Patient had a wound  culture that had no growth at last clinic visit. PuraPly was placed in standard fashion today. I recommended continuing compression wrap. Patient will follow-up in 1 week Procedures Wound #13 Pre-procedure diagnosis of Wound #13 is a Venous Leg Ulcer located on the Left,Distal,Posterior Lower Leg .Severity of Tissue Pre Debridement is: Fat layer exposed. There was a Excisional Skin/Subcutaneous Tissue Debridement with a total area of 0.56 sq cm performed by Kalman Shan, MD. With the following instrument(s): Curette to remove Viable and Non-Viable tissue/material. Material removed includes Subcutaneous Tissue, Slough, and Biofilm after achieving pain control using Lidocaine. A time out was conducted at 14:03, prior to the start of the procedure. A Minimum amount of bleeding was controlled with Pressure. The procedure was tolerated well. Post Debridement Measurements: 0.8cm length x 0.7cm width x 0.2cm depth; 0.088cm^3 volume. Character of Wound/Ulcer Post Debridement is improved. Severity of Tissue Post Debridement is: Fat layer exposed. Post procedure Diagnosis Wound #13: Same as Pre-Procedure Pre-procedure diagnosis of Wound #13 is a Venous Leg Ulcer located on the Left,Distal,Posterior Lower Leg. A skin graft procedure using a bioengineered skin substitute/cellular or tissue based product was performed by Kalman Shan, MD with the following instrument(s): Forceps and Scissors. Puraply was applied and secured with Steri-Strips. 3 sq cm of product was utilized and 1 sq cm was wasted due to [place]. Post Application, mepitel was applied. A Time Out was conducted at 14:05, prior to the start of the procedure. The procedure was tolerated well. Post procedure Diagnosis Wound #13: Same as Pre-Procedure . Plan Follow-up Appointments: Wound #13 Left,Distal,Posterior Lower Leg: Return Appointment in 1 week. - Puraply #2 on 07/26/21 Nurse Visit as needed Bathing/ Shower/ Hygiene: May shower  with wound dressing protected with water repellent cover or cast protector. No tub bath. Anesthetic (Use 'Patient Medications' Section for Anesthetic Order Entry): Lidocaine applied to wound bed Cellular or Tissue Based Products: Cellular or Tissue Based Product applied to wound bed; including contact layer, fixation with steri-strips, dry gauze and cover dressing. (DO NOT REMOVE). - Puraply #1 applied 07/19/21 Edema Control - Lymphedema / Segmental Compressive Device / Other: Optional: One layer of unna paste to top of compression wrap (to act as an anchor). Patient to wear own compression stockings. Remove compression stockings every night before going to bed and put on every morning when getting up. - right leg Elevate, Exercise Daily and Avoid Standing for Long Periods of Time. Elevate legs to the level of the heart and pump ankles as often as possible Elevate leg(s) parallel to the floor when sitting. Compression Pump: Use compression pump on left lower extremity for 60 minutes, twice daily. - TWICE DAILY for 1 hour Compression Pump: Use compression pump on right lower extremity for 60 minutes, twice daily. - TWICE DAILY for 1 hour FAYETTA, RANKIN. ([id number]) WOUND #13: -  Lower Leg Wound Laterality: Left, Posterior, Distal Cleanser: Soap and Water 2 x Per Week/30 Days Discharge Instructions: Gently cleanse wound with antibacterial soap, rinse and pat dry prior to dressing wounds Secondary Dressing: ABD Pad 5x9 (in/in) 2 x Per Week/30 Days Discharge Instructions: Cover with ABD pad Compression Wrap: Medichoice 4 layer Compression System, 35-40 mmHG 2 x Per Week/30 Days Discharge Instructions: Apply multi-layer wrap as directed. 1. Puraply #1 placed in standard fashion today 2. Continue 4-layer compression 3. Follow-up in 1 week 4. In office sharp debridement Electronic Signature(s) Signed: 07/19/2021 2:28:04 PM By: Kalman Shan DO Entered By: Kalman Shan on 07/19/2021  14:27:17 KATELYNN, MCALPINE ([id number]) -------------------------------------------------------------------------------- ROS/PFSH Details Patient Name: SHAUNTEA, KADY. Date of Service: 07/19/2021 1:15 PM Medical Record Number: [id number] Patient Account Number: [id number] Date of Birth/Sex: 26-Aug-1946 (75 y.o. F) Treating RN: [name] Primary Care Provider: [name] Other Clinician: Referring Provider: [name] Treating Provider/Extender: Yaakov Guthrie in Treatment: 14 Label Progress Note Print Version as History and Physical for this encounter Information Obtained From Patient Constitutional Symptoms (General Health) Medical History: Past Medical History Notes: Vein ablation LLE 2010 Vascular Sx ( vein ablationo) LLE 02/2015 Eyes Medical History: Positive for: Cataracts Negative for: Glaucoma; Optic Neuritis Past Medical History Notes: Horners syndrome Ear/Nose/Mouth/Throat Medical History: Negative for: Chronic sinus problems/congestion; Middle ear problems Hematologic/Lymphatic Medical History: Negative for: Anemia; Hemophilia; Human Immunodeficiency Virus; Lymphedema; Sickle Cell Disease Respiratory Medical History: Positive for: Asthma; Sleep Apnea Negative for: Aspiration; Chronic Obstructive Pulmonary Disease (COPD); Pneumothorax; Tuberculosis Cardiovascular Medical History: Positive for: Deep Vein Thrombosis - LLE 2010; Hypertension; Peripheral Venous Disease Negative for: Angina; Arrhythmia; Congestive Heart Failure; Coronary Artery Disease; Hypotension; Myocardial Infarction; Peripheral Arterial Disease; Phlebitis; Vasculitis Gastrointestinal Medical History: Negative for: Cirrhosis ; Colitis; Crohnos; Hepatitis A; Hepatitis B; Hepatitis C Endocrine Medical History: Negative for: Type I Diabetes; Type II Diabetes Genitourinary Medical History: Negative for: End Stage Renal Disease Past Medical History Notes: Kidney stones SHIVANYA, MANIS ([id number]) Immunological Medical History: Negative for: Lupus Erythematosus; Raynaudos; Scleroderma Integumentary (Skin) Medical History: Negative for: History of Burn; History of pressure wounds Musculoskeletal Medical History: Positive for: Osteoarthritis - neck Negative for: Gout; Rheumatoid Arthritis; Osteomyelitis Neurologic Medical History: Negative for: Dementia; Neuropathy; Quadriplegia; Paraplegia; Seizure Disorder Past Medical History Notes: ct scan showed swollen lymph nodes Oncologic Medical History: Positive for: Received Chemotherapy - interfeon immunotherapy; Received Radiation Past Medical History Notes: Melanoma (R) shoulder 07/2010; (R) axillary lymphnode removal Psychiatric Medical History: Negative for: Anorexia/bulimia; Confinement Anxiety HBO Extended History Items Eyes: Cataracts Immunizations Pneumococcal Vaccine: Received Pneumococcal Vaccination: Yes Received Pneumococcal Vaccination On or After 60th Birthday: No Immunization Notes: tetanus shot w/in the last 5 years per pt Implantable Devices None Hospitalization / Surgery History Type of Hospitalization/Surgery Melanoma left shoulder Family and Social History Cancer: Yes - Mother,Paternal Grandparents; Diabetes: No; Heart Disease: No; Hereditary Spherocytosis: No; Hypertension: Yes - Mother,Father; Kidney Disease: Yes - Paternal Grandparents; Lung Disease: Yes - Paternal Grandparents; Seizures: No; Stroke: No; Thyroid Problems: No; Tuberculosis: No; Former smoker - quit at age 21 - ended on 04/19/1966; Marital Status - Divorced; Alcohol Use: Never; Drug Use: No History; Caffeine Use: Daily; Financial Concerns: No; Food, Clothing or Shelter Needs: No; Support System Lacking: No; Transportation Concerns: No Electronic Signature(s) Signed: 07/19/2021 2:28:04 PM By: Kalman Shan DO Signed: 07/19/2021 3:44:27 PM By: [name] Entered By: Kalman Shan on 07/19/2021  14:24:32 Gallentine, [name] -------------------------------------------------------------------------------- SuperBill Details Patient Name: AZYRIA, TORELLO. Date of Service:  07/19/2021 Medical Record Number: [id number] Patient Account Number: [id number] Date of Birth/Sex: Mar 16, 1946 (75 y.o. F) Treating RN: [name] Primary Care Provider: [name] Other Clinician: Referring Provider: [name] Treating Provider/Extender: Yaakov Guthrie in Treatment: 14 Diagnosis Coding ICD-10 Codes Code Description [phone] Chronic venous hypertension (idiopathic) with ulcer and inflammation of left lower extremity I89.0 Lymphedema, not elsewhere classified L97.221 Non-pressure chronic ulcer of left calf limited to breakdown of skin Facility Procedures CPT4 Code Description: [id number] - SKIN SUB GRAFT TRNK/ARM/LEG Modifier: Quantity: 1 CPT4 Code Description: ICD-10 Diagnosis Description L97.221 Non-pressure chronic ulcer of left calf limited to breakdown of skin I87.332 Chronic venous hypertension (idiopathic) with ulcer and inflammation of l I89.0 Lymphedema, not elsewhere classified Modifier: eft lower extremity Quantity: Physician Procedures CPT4 Code Description: [id number] - WC PHYS SKIN SUB GRAFT TRNK/ARM/LEG Modifier: Quantity: 1 CPT4 Code Description: ICD-10 Diagnosis Description L97.221 Non-pressure chronic ulcer of left calf limited to breakdown of skin I87.332 Chronic venous hypertension (idiopathic) with ulcer and inflammation of lef I89.0 Lymphedema, not elsewhere  classified Modifier: t lower extremity Quantity: Electronic Signature(s) Signed: 07/19/2021 2:28:04 PM By: Kalman Shan DO Entered By: Kalman Shan on 07/19/2021 14:27:47

## 2021-07-20 ENCOUNTER — Ambulatory Visit (INDEPENDENT_AMBULATORY_CARE_PROVIDER_SITE_OTHER): Payer: Medicare Other | Admitting: Podiatry

## 2021-07-20 ENCOUNTER — Encounter: Payer: Self-pay | Admitting: Podiatry

## 2021-07-20 DIAGNOSIS — M79674 Pain in right toe(s): Secondary | ICD-10-CM

## 2021-07-20 DIAGNOSIS — M79675 Pain in left toe(s): Secondary | ICD-10-CM | POA: Diagnosis not present

## 2021-07-20 DIAGNOSIS — G629 Polyneuropathy, unspecified: Secondary | ICD-10-CM | POA: Diagnosis not present

## 2021-07-20 DIAGNOSIS — I872 Venous insufficiency (chronic) (peripheral): Secondary | ICD-10-CM

## 2021-07-20 DIAGNOSIS — B351 Tinea unguium: Secondary | ICD-10-CM | POA: Diagnosis not present

## 2021-07-20 NOTE — Progress Notes (Signed)
This patient returns to my office for at risk foot care.  This patient requires this care by a professional since this patient will be at risk due to having chronic venous insufficiency.  Patient is wearing a bandage and compression sock left leg..  Wearing compression sock right leg..  Patient is taking trental.This patient is unable to cut nails herself  since the patient cannot reach her  nails.These nails are painful walking and wearing shoes.  This patient presents for at risk foot care today.  General Appearance  Alert, conversant and in no acute stress.  Vascular  Dorsalis pedis and posterior tibial pulses are palpable.    Neurologic  Senn-Weinstein  WNL   B/L.  Nails Thick disfigured discolored nails with subungual debris  from hallux to fifth toes bilaterally. No evidence of bacterial infection or drainage bilaterally. Pincer hallux nails  B/L. Subungual drainage left hallux.  Orthopedic  No limitations of motion  feet .  No crepitus or effusions noted.  No bony pathology or digital deformities noted. DJD [name]  B/L.  Skin  normotropic skin with no porokeratosis noted bilaterally.  No signs of infections or ulcers noted.   Unknown swelling dorsum of right foot.  Onychomycosis  Pain in right toes  Pain in left toes  Consent was obtained for treatment procedures.   Mechanical debridement of nails 1-5  bilaterally performed with a nail nipper.  Filed with dremel without incident. No infection or ulcer.     Return office visit   10  weeks        Told patient to return for periodic foot care and evaluation due to potential at risk complications.   Gardiner Barefoot DPM

## 2021-07-26 ENCOUNTER — Other Ambulatory Visit: Payer: Self-pay

## 2021-07-26 ENCOUNTER — Encounter (HOSPITAL_BASED_OUTPATIENT_CLINIC_OR_DEPARTMENT_OTHER): Payer: Medicare Other | Admitting: Internal Medicine

## 2021-07-26 DIAGNOSIS — I890 Lymphedema, not elsewhere classified: Secondary | ICD-10-CM

## 2021-07-26 DIAGNOSIS — L97221 Non-pressure chronic ulcer of left calf limited to breakdown of skin: Secondary | ICD-10-CM | POA: Diagnosis not present

## 2021-07-26 DIAGNOSIS — L97821 Non-pressure chronic ulcer of other part of left lower leg limited to breakdown of skin: Secondary | ICD-10-CM | POA: Diagnosis not present

## 2021-07-26 DIAGNOSIS — I87332 Chronic venous hypertension (idiopathic) with ulcer and inflammation of left lower extremity: Secondary | ICD-10-CM | POA: Diagnosis not present

## 2021-07-26 NOTE — Progress Notes (Signed)
[name] ([id number]) Visit Report for 07/26/2021 Arrival Information Details Patient Name: [name], [name]. Date of Service: 07/26/2021 1:00 PM Medical Record Number: [id number] Patient Account Number: [id number] Date of Birth/Sex: May 29, 1946 (75 y.o. F) Treating RN: [name] Primary Care [name]: [name] Other Clinician: Referring [name]: [name] Treating [name]/Extender: [name] in Treatment: 15 Visit Information History Since Last Visit Pain Present Now: No Patient Arrived: Ambulatory Arrival Time: 13:03 Accompanied By: self Transfer Assistance: None Patient Identification Verified: Yes Secondary Verification Process Completed: Yes Patient Requires Transmission-Based No Precautions: Patient Has Alerts: Yes Patient Alerts: Patient on Blood Thinner ASPIRIN NOT diabetic Electronic Signature(s) Signed: 07/26/2021 4:25:23 PM By: Amber Amen RN Entered By: [name] on 07/26/2021 13:04:08 [name] ([id number]) -------------------------------------------------------------------------------- Clinic Level of Care Assessment Details Patient Name: [name], [name]. Date of Service: 07/26/2021 1:00 PM Medical Record Number: [id number] Patient Account Number: [id number] Date of Birth/Sex: [birth year] (75 y.o. F) Treating RN: [name] Primary Care Yeiren Whitecotton: [name] Other Clinician: Referring [name]: [name] Treating Quadre Bristol/Extender: [name] in Treatment: 15 Clinic Level of Care Assessment Items TOOL 1 Quantity Score []  - Use when EandM and Procedure is performed on INITIAL visit 0 ASSESSMENTS - Nursing Assessment / Reassessment []  - General Physical Exam (combine w/ comprehensive assessment (listed just below) when performed on new 0 pt. evals) []  - 0 Comprehensive Assessment (HX, ROS, Risk Assessments, Wounds Hx, etc.) ASSESSMENTS - Wound and Skin Assessment / Reassessment []   - Dermatologic / Skin Assessment (not related to wound area) 0 ASSESSMENTS - Ostomy and/or Continence Assessment and Care []  - Incontinence Assessment and Management 0 []  - 0 Ostomy Care Assessment and Management (repouching, etc.) PROCESS - Coordination of Care []  - Simple Patient / Family Education for ongoing care 0 []  - 0 Complex (extensive) Patient / Family Education for ongoing care []  - 0 Staff obtains Programmer, systems, Records, Test Results / Process Orders []  - 0 Staff telephones HHA, Nursing Homes / Clarify orders / etc []  - 0 Routine Transfer to another Facility (non-emergent condition) []  - 0 Routine Hospital Admission (non-emergent condition) []  - 0 New Admissions / Biomedical engineer / Ordering NPWT, Apligraf, etc. []  - 0 Emergency Hospital Admission (emergent condition) PROCESS - Special Needs []  - Pediatric / Minor Patient Management 0 []  - 0 Isolation Patient Management []  - 0 Hearing / Language / Visual special needs []  - 0 Assessment of Community assistance (transportation, D/C planning, etc.) []  - 0 Additional assistance / Altered mentation []  - 0 Support Surface(s) Assessment (bed, cushion, seat, etc.) INTERVENTIONS - Miscellaneous []  - External ear exam 0 []  - 0 Patient Transfer (multiple staff / Civil Service fast streamer / Similar devices) []  - 0 Simple Staple / Suture removal (25 or less) []  - 0 Complex Staple / Suture removal (26 or more) []  - 0 Hypo/Hyperglycemic Management (do not check if billed separately) []  - 0 Ankle / Brachial Index (ABI) - do not check if billed separately Has the patient been seen at the hospital within the last three years: Yes Total Score: 0 Level Of Care: ____ [name] ([id number]) Electronic Signature(s) Signed: 07/26/2021 3:15:27 PM By: [name] Entered By: [name] on 07/26/2021 13:49:41 [name], [name] ([id number]) -------------------------------------------------------------------------------- Compression  Therapy Details Patient Name: [name], [name]. Date of Service: 07/26/2021 1:00 PM Medical Record Number: [id number] Patient Account Number: [id number] Date of Birth/Sex: 06-09-1946 (75 y.o. F) Treating RN: [name] Primary Care Danayah Smyre: [name] Other Clinician:  Referring Shah Insley: [name] Treating Zaylyn Bergdoll/Extender: [name] in Treatment: 15 Compression Therapy Performed for Wound Assessment: Wound #13 Left,Distal,Posterior Lower Leg Performed By: Amber Argyle, RN Compression Type: Four Layer Post Procedure Diagnosis Same as Pre-procedure Electronic Signature(s) Signed: 07/26/2021 3:15:27 PM By: [name] Entered By: [name] on 07/26/2021 13:49:32 [name] ([id number]) -------------------------------------------------------------------------------- Encounter Discharge Information Details Patient Name: [name], [name]. Date of Service: 07/26/2021 1:00 PM Medical Record Number: [id number] Patient Account Number: [id number] Date of Birth/Sex: 09-Jun-1946 (75 y.o. F) Treating RN: [name] Primary Care Emmagene Ortner: [name] Other Clinician: Referring Galia Rahm: [name] Treating Kiyah Demartini/Extender: [name] in Treatment: 15 Encounter Discharge Information Items Post Procedure Vitals Discharge Condition: Stable Temperature (F): 98.1 Ambulatory Status: Ambulatory Pulse (bpm): 83 Discharge Destination: Home Respiratory Rate (breaths/min): 16 Transportation: Private Auto Blood Pressure (mmHg): 161/79 Accompanied By: self Schedule Follow-up Appointment: Yes Clinical Summary of Care: Electronic Signature(s) Signed: 07/26/2021 3:15:27 PM By: [name] Entered By: [name] on 07/26/2021 13:51:02 [name] ([id number]) -------------------------------------------------------------------------------- Lower Extremity Assessment Details Patient Name: [name], [name]. Date of Service: 07/26/2021  1:00 PM Medical Record Number: [id number] Patient Account Number: [id number] Date of Birth/Sex: Mar 22, 1946 (75 y.o. F) Treating RN: [name] Primary Care Darrelle Barrell: [name] Other Clinician: Referring Irvin Lizama: [name] Treating Kaida Games/Extender: [name] in Treatment: 15 Edema Assessment Assessed: [name]: Yes] Patrice Paradise: No] Edema: [Left: Ye] [Right: s] Calf Left: Right: Point of Measurement: 32 cm From Medial Instep 41 cm Ankle Left: Right: Point of Measurement: 10 cm From Medial Instep 21.5 cm Vascular Assessment Pulses: Dorsalis Pedis Palpable: [Left:Yes] Electronic Signature(s) Signed: 07/26/2021 4:25:23 PM By: Amber Amen RN Entered By: [name] on 07/26/2021 13:14:19 [name], [name] ([id number]) -------------------------------------------------------------------------------- Multi Wound Chart Details Patient Name: [name], [name]. Date of Service: 07/26/2021 1:00 PM Medical Record Number: [id number] Patient Account Number: [id number] Date of Birth/Sex: 13-Oct-1946 (75 y.o. F) Treating RN: [name] Primary Care Keion Neels: [name] Other Clinician: Referring Rebekah Zackery: [name] Treating [name] Domingo/Extender: [name] in Treatment: 15 Vital Signs Height(in): 70 Pulse(bpm): 44 Weight(lbs): 212 Blood Pressure(mmHg): 161/79 Body Mass Index(BMI): 38 Temperature(F): 98.3 Respiratory Rate(breaths/min): 18 Photos: [N/A:N/A] Wound Location: Left, Distal, Posterior Lower Leg N/A N/A Wounding Event: Gradually Appeared N/A N/A Primary Etiology: Venous Leg Ulcer N/A N/A Comorbid History: Cataracts, Asthma, Sleep Apnea, N/A N/A Deep Vein Thrombosis, Hypertension, Peripheral Venous Disease, Osteoarthritis, Received Chemotherapy, Received Radiation Date Acquired: 02/24/2021 N/A N/A Weeks of Treatment: 15 N/A N/A Wound Status: Open N/A N/A Measurements L x W x D (cm) 0.6x0.5x0.1 N/A N/A Area (cm) : 0.236  N/A N/A Volume (cm) : 0.024 N/A N/A % Reduction in Area: 96.00% N/A N/A % Reduction in Volume: 95.90% N/A N/A Classification: Full Thickness Without Exposed N/A N/A Support Structures Exudate Amount: Medium N/A N/A Exudate Type: Serosanguineous N/A N/A Exudate Color: red, brown N/A N/A Granulation Amount: Medium (34-66%) N/A N/A Granulation Quality: Red, Pink N/A N/A Necrotic Amount: Medium (34-66%) N/A N/A Exposed Structures: Fat Layer (Subcutaneous Tissue): N/A N/A Yes Fascia: No Tendon: No Muscle: No Joint: No Bone: No Epithelialization: Small (1-33%) N/A N/A Debridement: Debridement - Excisional N/A N/A Pre-procedure Verification/Time 15:37 N/A N/A Out Taken: Pain Control: Lidocaine N/A N/A Tissue Debrided: Subcutaneous, Slough N/A N/A Level: Skin/Subcutaneous Tissue N/A N/A Debridement Area (sq cm): 0.3 N/A N/A Instrument: Curette N/A N/A Bleeding: Minimum N/A N/A Hemostasis Achieved: Pressure N/A N/A Procedure was tolerated well N/A N/A NORMAN, BIER ([id number]) Debridement Treatment Response:  Post Debridement 0.6x0.5x0.1 N/A N/A Measurements L x W x D (cm) Post Debridement Volume: 0.024 N/A N/A (cm) Procedures Performed: Debridement N/A N/A Treatment Notes Electronic Signature(s) Signed: 07/26/2021 1:49:48 PM By: Kalman Shan DO Entered By: Kalman Shan on 07/26/2021 13:43:32 Linden, [name] ([id number]) -------------------------------------------------------------------------------- Multi-Disciplinary Care Plan Details Patient Name: [name], [name]. Date of Service: 07/26/2021 1:00 PM Medical Record Number: [id number] Patient Account Number: [id number] Date of Birth/Sex: 31-Oct-1946 (75 y.o. F) Treating RN: [name] Primary Care Jahyra Sukup: [name] Other Clinician: Referring Keyri Salberg: [name] Treating Latravious Levitt/Extender: [name] in Treatment: 15 Active Inactive Electronic Signature(s) Signed: 07/26/2021 3:15:27  PM By: [name] Entered By: [name] on 07/26/2021 13:36:38 Mehra, [name] ([id number]) -------------------------------------------------------------------------------- Pain Assessment Details Patient Name: [name], [name]. Date of Service: 07/26/2021 1:00 PM Medical Record Number: [id number] Patient Account Number: [id number] Date of Birth/Sex: [birth year] (75 y.o. F) Treating RN: [name] Primary Care Ellyanna [name]: [name] Other Clinician: Referring Marialuisa Basara: [name] Treating Rohil Lesch/Extender: [name] in Treatment: 15 Active Problems Location of Pain Severity and Description of Pain Patient Has Paino No Site Locations Rate the pain. Current Pain Level: 0 Pain Management and Medication Current Pain Management: Electronic Signature(s) Signed: 07/26/2021 4:25:23 PM By: Amber Amen RN Entered By: [name] on 07/26/2021 13:04:40 Knightly, [name] ([id number]) -------------------------------------------------------------------------------- Patient/Caregiver Education Details Patient Name: MAKALEIGH, REINARD. Date of Service: 07/26/2021 1:00 PM Medical Record Number: [id number] Patient Account Number: [id number] Date of Birth/Gender: 01-May-1946 (75 y.o. F) Treating RN: [name] Primary Care Physician: [name] Other Clinician: Referring Physician: Ria [name] Treating Physician/Extender: [name] in Treatment: 15 Education Assessment Education Provided To: Patient Education Topics Provided Wound/Skin Impairment: Electronic Signature(s) Signed: 07/26/2021 3:15:27 PM By: [name] Entered By: [name] on 07/26/2021 13:49:59 Obeirne, [name] ([id number]) -------------------------------------------------------------------------------- Wound Assessment Details Patient Name: [name], [name]. Date of Service: 07/26/2021 1:00 PM Medical Record Number: [id number] Patient Account Number: [id number] Date of  Birth/Sex: 25-Jun-1946 (75 y.o. F) Treating RN: [name] Primary Care Greggory Safranek: [name] Other Clinician: Referring Gigi Onstad: [name] Treating Yoshua Geisinger/Extender: [name] in Treatment: 15 Wound Status Wound Number: 13 Primary Venous Leg Ulcer Etiology: Wound Location: Left, Distal, Posterior Lower Leg Wound Open Wounding Event: Gradually Appeared Status: Date Acquired: 02/24/2021 Comorbid Cataracts, Asthma, Sleep Apnea, Deep Vein Thrombosis, Weeks Of Treatment: 15 History: Hypertension, Peripheral Venous Disease, Osteoarthritis, Clustered Wound: No Received Chemotherapy, Received Radiation Photos Wound Measurements Length: (cm) 0.6 Width: (cm) 0.5 Depth: (cm) 0.1 Area: (cm) 0.236 Volume: (cm) 0.024 % Reduction in Area: 96% % Reduction in Volume: 95.9% Epithelialization: Small (1-33%) Tunneling: No Undermining: No Wound Description Classification: Full Thickness Without Exposed Support Structures Exudate Amount: Medium Exudate Type: Serosanguineous Exudate Color: red, brown Foul Odor After Cleansing: No Slough/Fibrino Yes Wound Bed Granulation Amount: Medium (34-66%) Exposed Structure Granulation Quality: Red, Pink Fascia Exposed: No Necrotic Amount: Medium (34-66%) Fat Layer (Subcutaneous Tissue) Exposed: Yes Necrotic Quality: Adherent Slough Tendon Exposed: No Muscle Exposed: No Joint Exposed: No Bone Exposed: No Treatment Notes Wound #13 (Lower Leg) Wound Laterality: Left, Posterior, Distal Cleanser Soap and Water Discharge Instruction: Gently cleanse wound with antibacterial soap, rinse and pat dry prior to dressing wounds Peri-Wound Care [name], [name] ([id number]) Topical Primary Dressing Secondary Dressing ABD Pad 5x9 (in/in) Discharge Instruction: Cover with ABD pad Secured With Compression Wrap Medichoice 4 layer Compression System, 35-40 mmHG Discharge Instruction: Apply multi-layer wrap as  directed. Compression Stockings Add-Ons Electronic Signature(s) Signed: 07/26/2021  4:25:23 PM By: Amber Amen RN Entered By: [name] on 07/26/2021 13:11:32 [name], [name] ([id number]) -------------------------------------------------------------------------------- Vitals Details Patient Name: [name], [name]. Date of Service: 07/26/2021 1:00 PM Medical Record Number: [id number] Patient Account Number: [id number] Date of Birth/Sex: [birth year] (75 y.o. F) Treating RN: [name] Primary Care Areanna Gengler: [name] Other Clinician: Referring Floride Hutmacher: [name] Treating Jamesyn Moorefield/Extender: [name] in Treatment: 15 Vital Signs Time Taken: 13:04 Temperature (F): 98.3 Height (in): 63 Pulse (bpm): 83 Weight (lbs): 212 Respiratory Rate (breaths/min): 18 Body Mass Index (BMI): 37.6 Blood Pressure (mmHg): 161/79 Reference Range: 80 - 120 mg / dl Electronic Signature(s) Signed: 07/26/2021 4:25:23 PM By: Amber Amen RN Entered By: [name] on 07/26/2021 13:04:24

## 2021-07-26 NOTE — Progress Notes (Signed)
Amber, [name] ([id number]) Visit Report for 07/26/2021 Chief Complaint Document Details Patient Name: [name], [name]. Date of Service: 07/26/2021 1:00 PM Medical Record Number: [id number] Patient Account Number: [id number] Date of Birth/Sex: [name] 11, 1946 (75 y.o. F) Treating RN: Donnamarie Poag Primary Care Provider: [name] Other Clinician: Referring Provider: [name] Treating Provider/Extender: Yaakov Guthrie in Treatment: 15 Information Obtained from: Patient Chief Complaint Left calf venous stasis ulceration. 11/13/16; the patient is here for a left calf recurrent ulceration which is painful and has been present for the last month 01/22/17; the patient re-presents here for recurrent difficulties with blistering and leaking fluid on her left posterior calf 12/10/18; the patient returns to clinic for a wound on the left medial calf 04/12/2021; left lower extremity wound, 05/31/21; right leg wound Electronic Signature(s) Signed: 07/26/2021 1:49:48 PM By: Kalman Shan DO Entered By: Kalman Shan on 07/26/2021 13:44:00 [name] ([id number]) -------------------------------------------------------------------------------- Cellular or Tissue Based Product Details Patient Name: [name], [name]. Date of Service: 07/26/2021 1:00 PM Medical Record Number: [id number] Patient Account Number: [id number] Date of Birth/Sex: 24-Apr-1946 (75 y.o. F) Treating RN: Donnamarie Poag Primary Care Provider: [name] Other Clinician: Referring Provider: [name] Treating Provider/Extender: Yaakov Guthrie in Treatment: 15 Cellular or Tissue Based Product Type Wound #13 Left,Distal,Posterior Lower Leg Applied to: Performed By: Physician Kalman Shan, MD Cellular or Tissue Based Product Puraply AM Type: Level of Consciousness (Pre- Awake and Alert procedure): Pre-procedure Verification/Time Out Yes - 15:39 Taken: Location: trunk / arms / legs Wound  Size (sq cm): 0.3 Product Size (sq cm): 4 Waste Size (sq cm): 0 Amount of Product Applied (sq cm): 4 Instrument Used: Forceps, Scissors Lot #: [id number] Expiration Date: 11/03/2023 Reconstituted: Yes Solution Type: normal saline Solution Amount: 28ml Lot #: [id number] Solution Expiration Date: 04/06/2023 Secured: Yes Secured With: Steri-Strips Dressing Applied: Yes Primary Dressing: mepitel Response to Treatment: Procedure was tolerated well Level of Consciousness (Post- Awake and Alert procedure): Post Procedure Diagnosis Same as Pre-procedure Electronic Signature(s) Signed: 07/26/2021 3:15:27 PM By: Donnamarie Poag Entered By: Donnamarie Poag on 07/26/2021 [name] ([id number]) -------------------------------------------------------------------------------- Debridement Details Patient Name: [name], [name]. Date of Service: 07/26/2021 1:00 PM Medical Record Number: [id number] Patient Account Number: [id number] Date of Birth/Sex: 11/24/1945 (75 y.o. F) Treating RN: Donnamarie Poag Primary Care Provider: [name] Other Clinician: Referring Provider: [name] Treating Provider/Extender: Yaakov Guthrie in Treatment: 15 Debridement Performed for Wound #13 Left,Distal,Posterior Lower Leg Assessment: Performed By: Physician Kalman Shan, MD Debridement Type: Debridement Severity of Tissue Pre Debridement: Fat layer exposed Level of Consciousness (Pre- Awake and Alert procedure): Pre-procedure Verification/Time Out Yes - 15:37 Taken: Start Time: 15:37 Pain Control: Lidocaine Total Area Debrided (L x W): 0.6 (cm) x 0.5 (cm) = 0.3 (cm) Tissue and other material Slough, Subcutaneous, Slough debrided: Level: Skin/Subcutaneous Tissue Debridement Description: Excisional Instrument: Curette Bleeding: Minimum Hemostasis Achieved: Pressure Response to Treatment: Procedure was tolerated well Level of Consciousness (Post- Awake and  Alert procedure): Post Debridement Measurements of Total Wound Length: (cm) 0.6 Width: (cm) 0.5 Depth: (cm) 0.1 Volume: (cm) 0.024 Character of Wound/Ulcer Post Debridement: Improved Severity of Tissue Post Debridement: Fat layer exposed Post Procedure Diagnosis Same as Pre-procedure Electronic Signature(s) Signed: 07/26/2021 1:49:48 PM By: Kalman Shan DO Signed: 07/26/2021 3:15:27 PM By: Donnamarie Poag Entered By: Donnamarie Poag on 07/26/2021 13:38:59 [name] ([id number]) -------------------------------------------------------------------------------- HPI Details Patient Name: [name], [name]. Date of Service: 07/26/2021 1:00 PM Medical Record Number: [id number] Patient Account Number: [id number] Date  of Birth/Sex: 02-13-1946 (75 y.o. F) Treating RN: Donnamarie Poag Primary Care Provider: [name] Other Clinician: Referring Provider: [name] Treating Provider/Extender: Yaakov Guthrie in Treatment: 15 History of Present Illness HPI Description: Pleasant 75 year old with history of chronic venous insufficiency. No diabetes or peripheral vascular disease. Left ABI 1.29. Questionable history of left lower extremity DVT. She developed a recurrent ulceration on her left lateral calf in December 2015, which she attributes to poor diet and subsequent lower extremity edema. She underwent endovenous laser ablation of her left greater saphenous vein in 2010. She underwent laser ablation of accessory branch of left GSV in April 2016 by Dr. Kellie Simmering at [facility]. She was previously wearing Unna boots, which she tolerated well. Tolerating 2 layer compression and cadexomer iodine. She returns to clinic for follow-up and is without new complaints. She denies any significant pain at this time. She reports persistent pain with pressure. No claudication or ischemic rest pain. No fever or chills. No drainage. READMISSION 11/13/16; this is a 75 year old woman who is not a  diabetic. She is here for a review of a painful area on her left medial lower extremity. I note that she was seen here previously last year for wound I believe to be in the same area. At that time she had undergone previously a left greater saphenous vein ablation by Dr. Kellie Simmering and she had a ablation of the anterior accessory branch of the left greater saphenous vein in March 2016. Seeing that the wound actually closed over. In reviewing the history with her today the ulcer in this area has been recurrent. She describes a biopsy of this area in 2009 that only showed stasis physiology. She also has a history of today malignant melanoma in the right shoulder for which she follows with Dr. 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 Do not see that she has ever had arterial studies in the left leg. She tells me she has a follow-up with Dr. Kellie Simmering in roughly 10 days In any case she developed the reopening of this area roughly a month ago. On the background of this she describes rapidly increasing edema which has responded to Lasix 40 mg and metolazone 2.5 mg as well as the patient's lymph massage. She has been told she has both venous insufficiency and lymphedema but she cannot tolerate compression stockings 11/28/16; the patient saw Dr. Kellie Simmering recently. Per the patient he did arterial Dopplers in the office that did not show evidence of arterial insufficiency, per the patient he stated "treat this like an ordinary venous ulcer". She also saw her dermatologist Dr. Ronnald Ramp who felt that this was more of a vascular ulcer. In general things are improving although she arrives today with increasing bilateral lower extremity edema with weeping a deeper fluid through the wound on the left medial leg compatible with some degree of lymphedema 12/04/16; the patient's wound is fully epithelialized but I  don't think fully healed. We will do another week of depression with Promogran and TCA however I suspect we'll be able to discharge her next week. This is a very unusual-looking wound which was initially a figure-of-eight type wound lying on its side surrounded by petechial like hemorrhage. She has had venous ablation on this side. She apparently does not have an arterial issue per Dr. Kellie Simmering. She saw her dermatologist thought it was "vascular". Patient is definitely going  to need ongoing compression and I talked about this with her today she will go to elastic therapy after she leaves here next week 12/11/16; the patient's wound is not completely closed today. She has surrounding scar tissue and in further discussion with the patient it would appear that she had ulcers in this area in 2009 for a prolonged period of time ultimately requiring a punch biopsy of this area that only showed venous insufficiency. I did not previously pickup on this part of the history from the patient. 12/18/16; the patient's wound is completely epithelialized. There is no open area here. She has significant bilateral venous insufficiency with secondary lymphedema to a mild-to-moderate degree she does not have compression stockings.. She did not say anything to me when I was in the room, she told our intake nurse that she was still having pain in this area. This isn't unusual recurrent small open area. She is going to go to elastic therapy to obtain compression stockings. 12/25/16; the patient's wound is fully epithelialized. There is no open area here. The patient describes some continued episodic discomfort in this area medial left calf. However everything looks fine and healed here. She is been to elastic therapy and caught herself 15-20 mmHg stockings, they apparently were having trouble getting 20-30 mm stockings in her size 01/22/17; this is a patient we discharged from the clinic a month ago. She has a recurrent open  wound on her medial left calf. She had 15 mm support stockings. I told her I thought she needed 20-30 mm compression stockings. She tells me that she has been ill with hospitalization secondary to asthma and is been found to have severe hypokalemia likely secondary to a combination of Lasix and metolazone. This morning she noted blistering and leaking fluid on the posterior part of her left leg. She called our intake nurse urgently and we was saw her this afternoon. She has not had any real discomfort here. I don't know that she's been wearing any stockings on this leg for at least 2-3 days. ABIs in this clinic were 1.21 on the right and 1.3 on the left. She is previously seen vascular surgery who does not think that there is a peripheral arterial issue. 01/30/17; Patient arrives with no open wound on the left leg. She has been to elastic therapy and obtained 20-34mmhg below knee stockings and she has one on the right leg today. READMISSION 02/19/18; this Cocker is a now 75 year old patient we've had in this clinic perhaps 3 times before. I had last looked at her from January 07 December 2016 with an area on the medial left leg. We discharged her on 12/25/16 however she had to be readmitted on 01/22/17 with a recurrence. I have in my notes that we discharged her on 20-30 mm stockings although she tells me she was only wearing support hose because she cannot get stockings on predominantly related to her cervical spine surgery/issues. She has had previous ablations done by vein and vascular in [place] including a great saphenous vein ablation on the left with an anterior accessory branch ablation I think both of these were in 2016. On one of the previous visit she had a biopsy noted 2009 that was negative. She is not felt to have an arterial issue. She is not a diabetic. She does have a history of obstructive sleep apnea hypertension asthma as well as chronic venous insufficiency and lymphedema. On this  occasion she noted 2 dry scaly patch on her left leg.  She tried to put lotion on this it didn't really help. There were 2 open areas.the patient has been seeing her primary physician from 02/05/18 through 02/14/18. She had Unna boots applied. The superior wound now on the lateral left leg has closed but she's had one wound that remains open on the lateral left leg. This is not the same spot as we dealt with in 2018. ABIs in this clinic were 1.3 bilaterally [name], [name] ([id number]) 02/26/18; patient has a small wound on the left lateral calf. Dimensions are down. She has chronic venous insufficiency and lymphedema. 03/05/18; small open area on the left lateral calf. Dimensions are down. Tightly adherent necrotic debris over the surface of the wound which was difficult to remove. Also the dressing [over collagen] stuck to the wound surface. This was removed with some difficulty as well. Change the primary dressing to Hydrofera Blue ready 03/12/18; small open area on the left lateral calf. Comes in with tightly adherent surface eschar as well as some adherent Hydrofera Blue. 03/19/18; open area on the left lateral calf. Again adherent surface eschar as well as some adherent Hydrofera Blue nonviable subcutaneous tissue. She complained of pain all week even with the reduction from 4-3 layer compression I put on last week. Also she had an increase in her ankle and calf measurements probably related to the same thing. 03/26/18; open area on the left lateral calf. A very small open area remains here. We used silver alginate starting last week as the Hydrofera Blue seem to stick to the wound bed. In using 4-layer compression 04/02/18; the open area in the left lateral calf at some adherent slough which I removed there is no open area here. We are able to transition her into her own compression stocking. Truthfully I think this is probably his support hose. However this does not maintain skin integrity will  be limited. She cannot put over the toe compression stockings on because of neck problems hand problems etc. She is allergic to the lining layer of juxta lites. We might be forced to use extremitease stocking should this fail READMIT 11/24/2018 Patient is now a 75 year old woman who is not a diabetic. She has been in this clinic on at least 3 previous occasions largely with recurrent wounds on her left leg secondary to chronic venous insufficiency with secondary lymphedema. Her situation is complicated by inability to get stockings on and an allergy to neoprene which is apparently a component and at least juxta lites and other stockings. As a result she really has not been wearing any stockings on her legs. She tells [place] that roughly 2 or 3 weeks ago she started noticing a stinging sensation just above her ankle on the left medial aspect. She has been diagnosed with pseudogout and she wondered whether this was what she was experiencing. She tried to dress this with something she bought at the store however subsequently it pulled skin off and now she has an open wound that is not improving. She has been using Vaseline gauze with a cover bandage. She saw her primary doctor last week who put an Haematologist on her. ABIs in this clinic was 1.03 on the left 2/12; the area is on the left medial ankle. Odd-looking wound with what looks to be surface epithelialization but a multitude of small petechial openings. This clearly not closed yet. We have been using silver alginate under 3 layer compression with TCA 2/19; the wound area did not look quite as good this  week. Necrotic debris over the majority of the wound surface which required debridement. She continues to have a multitude of what looked to be small petechial openings. She reminds [place] that she had a biopsy on this initially during her first outbreak in 2015 in [place] [facility]. She expresses concern about this being a possible melanoma. She  apparently had a nodular melanoma up on her shoulder that was treated with excision, lymph node removal and ultimately radiation. I assured her that this does not look anything like melanoma. Except for the petechial reaction it does look like a venous insufficiency area and she certainly has evidence of this on both sides 2/26; a difficult area on the left medial ankle. The patient clearly has chronic venous hypertension with some degree of lymphedema. The odd thing about the area is the small petechial hemorrhages. I am not really sure how to explain this. This was present last time and this is not a compression injury. We have been using Hydrofera Blue which I changed to last week 3/4; still using Hydrofera Blue. Aggressive debridement today. She does not have known arterial issues. She has seen Dr. Kellie Simmering at [facility] vein and vascular and and has an ablation on the left. [Anterior accessory branch of the greater saphenous]. From what I remember they did not feel she had an arterial issue. The patient has had this area biopsied in 2009 at [facility] dermatology and by her recollection they said this was "stasis". She is also follow-up with dermatology locally who thought that this was more of a vascular issue 3/11; using Hydrofera Blue. Aggressive debridement today. She does not have an arterial issue. We are using 3 layer compression although we may need to go to 4. The patient has been in for multiple changes to her wrap since I last saw her a week ago. She says that the area was leaking. I do not have too much more information on what was found 01/19/19 on evaluation today patient was actually being seen for a nurse visit when unfortunately she had the area on her left lateral lower extremity as well as weeping from the right lower extremity that became apparent. Therefore we did end up actually seeing her for a full visit with myself. She is having some pain at this site as well but fortunately  nothing too significant at this point. No fevers, chills, nausea, or vomiting noted at this time. 3/18-Patient is back to the clinic with the left leg venous leg ulcer, the ulcer is larger in size, has a surface that is densely adherent with fibrinous tissue, the Hydrofera Blue was used but is densely adherent and there was difficulty in removing it. The right lower extremity was also wrapped for weeping edema. Patient has a new area over the left lateral foot above the malleolus that is small and appears to have no debris with intact surrounding skin. Patient is on increased dose of Lasix also as a means to edema management 3/25; the patient has a nonhealing venous ulcer on the medial left leg and last week developed a smaller area on the lateral left calf. We have been using Hydrofera Blue with a contact layer. 4/1; no major change in these wounds areas. Left medial and more recently left lateral calf. I tried Iodoflex last week to aid in debridement she did not tolerate this. She stated her pain was terrible all week. She took the top layer of the 4 layer compression off. 4/8; the patient actually looks somewhat better  in terms of her more prominent left lateral calf wound. There is some healthy looking tissue here. She is still complaining of a lot of discomfort. 4/15; patient in a lot of pain secondary to sciatica. She is on a prednisone taper prescribed by her primary physician. She has the 2 areas one on the left medial and more recently a smaller area on the left lateral calf. Both of these just above the malleoli 4/22; her back pain is better but she still states she is very uncomfortable and now feels she is intolerant to the [employer]. No real change in the wounds we have been using Sorbact. She has been previously intolerant to Iodoflex. There is not a lot of option about what we can use to debride this wound under compression that she no doubt needs. sHe states Ultram no longer works for  her pain 4/29; no major change in the wounds slightly increased depth. Surface on the original medial wound perhaps somewhat improved however the more recent area on the lateral left ankle is 100% covered in very adherent debris we have been using Sorbact. She tolerates 4 layer compression well and her edema control is a lot better. She has not had to come in for a nurse check 5/6; no major change in the condition of the wounds. She did consent to debridement today which was done with some difficulty. Continuing Sorbact. She did not tolerate Iodoflex. She was in for a check of her compression the day after we wrapped her last week this was adjusted but nothing much was found 5/13; no major change in the condition or area of the wounds. I was able to get a fairly aggressive debridement done on the lateral left leg wound. Even using Sorbact under compression. She came back in on Friday to have the wrap changed. She says she felt uncomfortable on the lateral aspect of her ankle. She has a long history of chronic venous insufficiency including previous ablation surgery on this side. 5/20-Patient returns for wounds on left leg with both wounds covered in slough, with the lateral leg wound larger in size, she has been in 3 layer compression and felt more comfortable, she describes pain in ankle, in leg and pins and needles in foot, and is about to try Pamelor for this 6/3; wounds on the left lateral and left medial leg. The area medially which is the most recent of the 2 seems to have had the largest increase in Nunda, [name]. ([id number]) dimensions. We have been using Sorbac to try and debride the surface. She has been to see orthopedics they apparently did a plain x-ray that was indeterminant. Diagnosed her with neuropathy and they have ordered an MRI to determine if there is underlying osteomyelitis. This was not high on my thought list but I suppose it is prudent. We have advised her to make an  appointment with vein and vascular in [place]. She has a history of a left greater saphenous and accessory vein ablations I wonder if there is anything else that can be done from a surgical point of view to help in these difficult refractory wounds. We have previously healed this wound on one occasion but it keeps on reopening [medial side] 6/10; deep tissue culture I did last week I think on the left medial wound showed both moderate E. coli and moderate staph aureus [MSSA]. She is going to require antibiotics and I have chosen Augmentin. We have been using Sorbact and we have made better  looking wound surface on both sides but certainly no improvement in wound area. She was back in last Friday apparently for a dressing changes the wrap was hurting her outer left ankle. She has not managed to get a hold of vein and vascular in [place]. We are going to have to make her that appointment 6/17; patient is tolerating the Augmentin. She had an MRI that I think was ordered by orthopedic surgeon this did not show osteomyelitis or an abscess did suggest cellulitis. We have been using Sorbact to the lateral and medial ankles. We have been trying to arrange a follow-up appointment with vein and vascular in [place] or did her original ablations. We apparently an area sent the request to vein and vascular in [facility] 6/24; patient has completed the Augmentin. We do not yet have a vein and vascular appointment in [place]. I am not sure what the issue is here we have asked her to call tomorrow. We are using Sorbact. Making some improvements and especially the medial wound. Both surfaces however look better medial and lateral. 7/1; the patient has been in contact with vein and vascular in [place] but has not yet received an appointment. Using Sorbact we have gradually improve the wound surface with no improvement in surface area. She is approved for Apligraf but the wound surface still is  not completely viable. She has not had to come in for a dressing change 7/8; the patient has an appointment with vein and vascular on 7/31 which is a Friday afternoon. She is concerned about getting back here for [place] to dress her wounds. I think it is important to have them goal for her venous reflux/history of ablations etc. to see if anything else can be done. She apparently tested positive for 1 of the blood tests with regards to lupus and saw a rheumatologist. He has raised the issue of vasculitis again. I have had this thought in the past however the evidence seems overwhelming that this is a venous reflux etiology. If the rheumatologist tells me there is clinical and laboratory investigation is positive for lupus I will rethink this. 7/15; the patient's wound surfaces are quite a bit better. The medial area which was her original wound now has no depth although the lateral wound which was the more recent area actually appears larger. Both with viable surfaces which is indeed better. Using Sorbact. I wanted to use Apligraf on her however there is the issue of the vein and vascular appointment on 7/31 at 2:00 in the afternoon which would not allow her to get back to be rewrapped and they would no doubt remove the graft 7/22; the patient's wound surfaces have moderate amount of debris although generally look better. The lateral one is larger with 2 small satellite areas superiorly. We are waiting for her vein and vascular appointment on 7/31. She has been approved for Apligraf which I would like to use after th 7/29; wound surfaces have improved no debridement is required we have been using Sorbact. She sees vein and vascular on Friday with this so question of whether anything can be done to lessen the likelihood of recurrence and/or speed the healing of these areas. She is already had previous ablations. She no doubt has severe venous hypertension 8/5-Patient returns at 1 week, she was in [place] for 3 days by her podiatrist, we have been using so backed to the wound, she has increased pain in both the wounds on the left lower leg especially the  more distal one on the lateral aspect 8/12-Patient returns at 1 week and she is agreeable to having debridement in both wounds on her left leg today. We have been using Sorbact, and vascular studies were reviewed at last visit 8/19; the patient arrives with her wounds fairly clean and no debridement is required. We have used Sorbact which is really done a nice job in cleaning up these very difficult wound surfaces. The patient saw Dr. Donzetta Matters of vascular surgery on 7/31. He did not feel that there was an arterial component. He felt that her treated greater saphenous vein is adequately addressed and that the small saphenous vein did not appear to be involved significantly. She was also noted to have deep venous reflux which is not treatable. Dr. Donzetta Matters mentioned the possibility of a central obstructive component leading to reflux and he offered her central venography. She wanted to discuss this or think about it. I have urged her to go ahead with this. She has had recurrent difficult wounds in these areas which do heal but after months in the clinic. If there is anything that can be done to reduce the likelihood of this I think it is worth it. 9/2 she is still working towards getting follow-up with Dr. Donzetta Matters to schedule her CT. Things are quite a bit worse venography. I put Apligraf on 2 weeks ago on both wounds on the medial and lateral part of her left lower leg. She arrives in clinic today with 3 superficial additional wounds above the area laterally and one below the wound medially. She describes a lot of discomfort. I think these are probably wrapped injuries. Does not look like she has cellulitis. 07/20/2019 on evaluation today patient appears to be doing somewhat poorly in regard to her lower extremity ulcers. She in fact showed signs of erythema  in fact we may even be dealing with an infection at this time. Unfortunately I am unsure if this is just infection or if indeed there may be some allergic reaction that occurred as a result of the Apligraf application. With that being said that would be unusual but nonetheless not impossible in this patient is one who is unfortunately allergic to quite a bit. Currently we have been using the Sorbact which seems to do as well as anything for her. I do think we may want to obtain a culture today to see if there is anything showing up there that may need to be addressed. 9/16; noted that last week the wounds look worse in 1 week follow-up of the Apligraf. Using Sorbact as of 2 days ago. She arrives with copious amounts of drainage and new skin breakdown on the back of the left calf. The wounds arm more substantial bilaterally. There is a fair amount of swelling in the left calf no overt DVT there is edema present I think in the left greater than right thigh. She is supposed to go on 9/28 for CT venography. The wounds on the medial and lateral calf are worse and she has new skin breakdown posteriorly at least new for me. This is almost developing into a circumferential wound area The Apligraf was taken off last week which I agree with things are not going in the right direction a culture was done we do not have that back yet. She is on Augmentin that she started 2 days ago 9/23; dressing was changed by her nurses on Monday. In general there is no improvement in the wound areas although the area looks  less angry than last week. She did get Augmentin for MSSA cultured on the 14th. She still appears to have too much swelling in the left leg even with 3 layer compression 9/30; the patient underwent her procedure on 9/28 by Dr. Donzetta Matters at vascular and vein specialist. She was discovered to have the common iliac vein measuring 12.2 mm but at the level of L4-L5 measured 3 mm. After stenting it measured 10 mm. 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 Rouleaux flow in the common femoral and femoral vein was observed much improved after stenting. We are using silver alginate to the wounds on the medial and lateral ankle on the left. 4 layer compression 10/7; the patient had fluid swelling around her knee and 4 layer compression. At the advice of vein and vascular this was reduced to 3 layer which she is tolerating better. We have been using silver alginate under 3 layer compression since last Friday 10/14; arrives with the areas on the left ankle looking a lot better. Inflammation in the area also a lot better. She came in for a nurse check on 10/9 10/21; continued nice improvement. Slight improvements in surface area of both the medial and lateral wounds on the left. A lot of the satellite lesions in the weeping erythema around these from stasis dermatitis is resolved. We have been using silver alginate KAELEA, GATHRIGHT ([id number]) 10/28; general improvement in the entire wound areas although not a lot of change in dimensions the wound certainly looks better. There is a lot less in terms of venous inflammation. Continue silver alginate this week however look towards Hydrofera Blue next week 11/4; very adherent debris on the medial wound left wound is not as bad. We have been using silver alginate. Change to [facility] today 11/11; very adherent debris on both wound areas. She went to vein and vascular last week and follow-up they put in [place] boot on this today. He says the [facility] was adherent. Wound is definitely not as good as last week. Especially on the left there the satellite lesions look more prominent 11/18; absolutely no better. erythema on lateral aspect with tenderness. 09/30/2019 on evaluation today patient appears to actually be doing better. Dr. Dellia Nims did put her on doxycycline last week which I do believe has helped her at this point. Fortunately there is no signs  of active infection at this time. No fevers, chills, nausea, vomiting, or diarrhea. I do believe he may want extend the doxycycline for 7 additional days just to ensure everything does completely cleared up the patient is in agreement with that plan. Otherwise she is going require some sharp debridement today 12/2; patient is completing a 2-week course of doxycycline. I gave her this empirically for inflammation as well as infection when I last saw her 2 weeks ago. All of this seems to be better. She is using silver alginate she has the area on the medial aspect of the larger area laterally and the 2 small satellite regions laterally above the major wound. 12/9; the patient's wound on the left medial and left lateral calf look really quite good. We have been using silver alginate. She saw vein and vascular in follow-up on 10/09/2019. She has had a previous left greater saphenous vein ablation by Dr. Oscar La in 2016. More recently she underwent a left common iliac vein stent by Dr. 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 The swelling is improved and certainly the wounds have improved.  The patient shows [place] today area on the right medial calf there is almost no wound but leaking lymphedema. She says she start this started 3 or 4 days ago. She did not traumatize it. It is not painful. She does not wear compression on that side 12/16; the patient continues to do well laterally. Medially still requiring debridement. The area on the right calf did not materialize to anything and is not currently open. We wrapped this last time. She has support stockings for that leg although I am not sure they are going to provide adequate compression 12/23; the lateral wound looks stable. Medially still requiring debridement for tightly adherent fibrinous debris. We've been using silver alginate. Surface area not any different 12/30; neither wound is any better with regards to surface and the area on the left  lateral is larger. I been using silver alginate to the left lateral which look quite good last week and Sorbact to the left medial 11/11/2019. Lateral wound area actually looks better and somewhat smaller. Medial still requires a very aggressive debridement today. We have been using Sorbact on both wound areas 1/13; not much better still adherent debris bilaterally. I been using Sorbact. She has severe venous hypertension. Probably some degree of dermal fibrosis distally. I wonder whether tighter compression might help and I am going to try that today. We also need to work on the bioburden 1/20; using Sorbact. She has severe venous hypertension status post stent placement for pelvic vein compression. We applied gentamicin last time to see if we could reduce bioburden I had some discussion with her today about the use of pentoxifylline. This is occasionally used in this setting for wounds with refractory venous insufficiency. However this interacts with Plavix. She tells me that she was put on this after stent placement for 3 months. She will call Dr. Claretha Cooper office to discuss 1/27; we are using gentamicin under Sorbact. 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 She has a stent. Wound medially is measuring smaller this week. Laterally measuring slightly larger although she has some satellite lesions superiorly 2/3; gentamicin under Sorbact under 4-layer compression. 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 She has a stent on Plavix. Her wounds are measuring smaller this week. More substantially laterally where there is a satellite lesion superiorly. 2/10; gentamicin under Sorbac. 4-layer compression. Patient communicated with Dr. Donzetta Matters at vein and vascular in [place]. He is okay with the patient coming off Plavix I will therefore start her on pentoxifylline for a 1 month trial. In general her wounds look better today. I had some concerns about  swelling in the left thigh however she measures 61.5 on the right and 63 on the mid thigh which does not suggest there is any difficulty. The patient is not describing any pain. 2/17; gentamicin under Sorbac 4-layer compression. She has been on pentoxifylline for 1 week and complains of loose stool. No nausea she is eating and drinking well 2/24; the patient apparently came in 2 days ago for a nurse visit when her wrap fell down. Both areas look a little worse this week macerated medially and satellite lesions laterally. Change to silver alginate today 3/3; wounds are larger today especially medially. She also has more swelling in her foot lower leg and I even noted some swelling in her posterior thigh which is tender. I wonder about the patency of her stent. Fortuitously she sees Dr. Claretha Cooper group on Friday 3/10; Mrs. Rivkin was seen by vein and vascular  on 3/5. The patient underwent ultrasound. There was no evidence of thrombosis involving the IVC no evidence of thrombosis involving the right common iliac vein there is no evidence of thrombosis involving the right external iliac vein the left external vein is also patent. The right common iliac vein stent appears patent bilateral common femoral veins are compressible and appear patent. I was concerned about the left common iliac stent however it looks like this is functional. She has some edema in the posterior thigh that was tender she still has that this week. I also note they had trouble finding the pulses in her left foot and booked her for an ABI baseline in 4 weeks. She will follow up in 6 months for repeat IVC duplex. The patient stopped the pentoxifylline because of diarrhea. It does not look like that was being effective in any case. I have advised her to go back on her aspirin 81 mg tablet, vascular it also suggested this 3/17; comes in today with her wound surfaces a lot better. The excoriations from last week considerably better  probably secondary to the TCA. We have been using silver alginate 3/24; comes in today with smaller wounds both medially and laterally. Both required debridement. There are 2 small satellite areas superiorly laterally. She also has a very odd bandlike area in the mid calf almost looking like there was a weakness in the wrap in a localized area. I would write this off as being this however anteriorly she has a small raised ballotable area that is very tender almost reminiscent of an abscess but there was no obvious purulent surface to it. 02/04/20 upon evaluation today patient appears to be doing fairly well in regard to her wounds today. Fortunately there is no signs of active infection at this time. No fevers, chills, nausea, vomiting, or diarrhea. She has been tolerating the dressing changes without complication. Fortunately I feel like she is showing signs of improvement although has been sometime since have seen her. Nonetheless the area of concern that Dr. Dellia Nims had last week where she had possibly an area of the wrap that was we can allow the leg to bulge appears to be doing significantly better today there is no signs of anything worsening. [name], [name] ([id number]) 4/7; the patient's wounds on her medial and lateral left leg continue to contract. We have been using a regular alginate. Last week she developed an area on the right medial lower leg which is probably a venous ulcer as well. 4/14; the wounds on her left medial and lateral lower leg continue to contract. Surface eschar. We have been using regular alginate. The area on the right medial lower leg is closed. We have been putting both legs under 4-layer contraction. The patient went back to see vein and vascular she had arterial studies done which were apparently "quite good" per the patient although I have not read their notes I have never felt she had an arterial issue. 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 This is been longstanding and refractory to exercise, leg elevation and longstanding use of compression wraps in our clinic as well as compression stockings on the times we have been able to get these to heal 4/21; we thought she actually might be close this week however she arrives in clinic with a lot of edema in her upper left calf and into her posterior thigh. This is been an intermittent problem here. She says the wrap fell down but  it was replaced with a nurse visit on Monday. We are using calcium alginate to the wounds and the wound sizes there not terribly larger than last week but there is a lot more edema 4/28; again wound edges are smaller on both sides. Her edema is better controlled than last time. She is obtained her compression pumps from [employer] although they have not been to her home to set these up. 5/5; left medial and left lateral both look stable. I am not sure the medial is any smaller. We have been using calcium alginate under 4-layer compression. oShe had an area on the right medial. This was eschared today. We have been wrapping this as well. She does not tolerate external compression stockings due to a history of various contact allergies. She has her compression pumps however the representative from the company is coming on her to show her how to use these tomorrow 5/19; patient with severe chronic venous insufficiency secondary to central venous disease. She had a stent placed in her left common iliac vein. She has done better since but still difficult to control wounds. She comes in today with nothing open on the right leg. Her areas on the left medial and left lateral are just about closed. We are using calcium alginate under 4-layer compression. She is using her external compression pumps at home She only has 15-20 support stockings. States she cannot get anything tighter than that on. 03/30/20-Patient returns at 1 week, the  wounds on the left leg are both slightly bigger, the last week she was on 3 layer compression which started to slide down. She is starting to use her lymphedema pumps although she stated on 1 day her right ankle started to swell up and she have to stop that day. Unfortunately the open area seem to oscillate between improving to the point of healing and then flaring up all to do with effectiveness of compression or lack of due to the left leg topography not keeping the compression wraps from rolling down 6/2 patient comes in with a 15/20 mmHg stocking on the right leg. She tells me that she developed a lot of swelling in her ankles she saw orthopedics she was felt to possibly be having a flare of pseudogout versus some other type of arthritis. She was put on steroids for a respiratory issue so that helps with the inflammation. She has not been using the pumps all week. She thinks the left thigh is more swollen than usual and I would agree with that. She has an appointment with Dr. Donzetta Matters 9 days or so from now 6/9; both wounds on the left medial and left lateral are smaller. We have been using calcium alginate under compression. She does not have an open wound on the right leg she is using a stocking and her compression pumps things are going well. She has an appointment with Dr. Donzetta Matters with regards to her stent in the left common iliac vein 6/16; the wounds on the left medial and left lateral ankle continues to contract. The patient saw Dr. Donzetta Matters and I think he seems satisfied. Ordered follow-up venous reflux studies on both sides in September. Cautioned that she may need thigh-high stockings. She has been using calcium alginate under compression on the left and her own stocking on the right leg. She tells [place] there are no open wounds on the right 6/23; left lateral is just about closed. Medial required debridement today. We have been using calcium alginate. Extensive discussion about  the compression pumps  she is only using these on 25 mmHg states she could not take 40 or 30 when the wrap came out to her home to demonstrate these. He said they should not feel tight 6/30; the left lateral wound has a slight amount of eschar. . The area medially is about the same using Hydrofera Blue. 7/7; left lateral wound still has some eschar. I will remove this next week may be closed. The area medially is very small using Hydrofera Blue with improvement. Unfortunately the stockings fell down. Unfortunately the blisters have developed at the edge of where the wrap fell. When this happened she says her legs hurt she did not use her pumps. We are not open Monday for her to come in and change the wraps and she had an appointment yesterday. She also tells me that she is going to have an MRI of her back. She is having pain radiating into her left anterior leg she thinks her from an L5 disc. She saw Dr. Ellene Route of neurosurgery 7/14; the area on the left lateral ankle area is closed. Still a small area medially however it looks better as well. We have been using Hydrofera Blue under 4-layer compression 7/21; left lateral ankle is still closed however her wound on the medial left calf is actually larger. This is probably because Hydrofera Blue got stuck to the wound. She came in for a nurse change on Friday and will do that again this week I was concerned about the amount of swelling that she had last week however she is using her compression pumps twice a day and the swelling seems well controlled 7/28; remaining wound on the left medial lower leg is smaller. We have been using moistened silver collagen under compression she is coming back for a nurse visit. For reasons that were not really clear she was just keeping her legs elevated and not using her compression pumps. I have asked her to use the compression pumps. She does not have any wounds on the right leg 06/15/20-Patient returns at 2 weeks, her LLE edema is worse  and she developed a blister wound that is new and has bigger posterior calf wound on right, we are using Prisma with pad, 4 layer compression. she has been on lasix 40 mg daily 8/18; patient arrives today with things a lot worse than I remember from a few weeks ago. She was seen last week. Noted that her edema was worse and that she now had a left lateral wound as well as deteriorating edema in the medial and posterior part of the lower leg. She says she is using his or her external compression pumps once a day although I wonder about the compliance. 8/25; weeping area on the right medial lower leg. This had actually gotten a small localized area of her compression stocking wet. oOn the left side there is a large denuded area on the posterior medial lower leg and smaller area on the lateral. This was not the original areas that we dealt with. 9/1 the patient's wound on the left leg include the left lateral and left posterior. Larger superficial wounds weeping. She has very poor edema control. Tender localized edema in the left lower medial ankle/heel probably because of localized wrap issues. She freely admits she is not using the compression pumps. She has been up on her feet a lot. She thinks the hydrofera blue is contributing to the pain she is experiencing.. This is a complaint that I have  occasionally heard 9/8; really not much improvement. The patient is still complaining of a lot of pain particularly when she uses compression pumps. I switched her to silver alginate last time because she found the Hydrofera Blue to be irritating. I don't hear much difference in her description with the silver alginate. 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 She has follow-up with Dr. Donzetta Matters I think for ultrasound next month AUBREE, DOODY ([id number]) 9/15; quite a bit of improvement today. We have less edema and more  epithelialization in both of her wound areas on the left medial and left lateral calf. These are not the site of her original wounds in this area. She says she has been using her compression pumps for 30 minutes twice a day, there pain issues that never quite understood. Silver alginate as the primary dressing 9/22; continued improvement. Both areas medially and laterally still have a small open area there is some eschar. She continues to complain of left medial ankle pain. Swelling in the leg is in much better condition. We have been using silver alginate 9/29; continued improvement. Both areas medially and laterally in the left calf look as though they are close some minor surface eschar but I think this is epithelialized. She comes in today saying she has a ruptured disc at L4-L5 cannot bend over to put on her stockings. 10/6; patient comes in today with no open wounds on either leg. However her edema on the left leg in the upper one third of the lower leg is poorly controlled nonpitting. She says that she could not use the pumps for 2 days and then she has been using the last couple of days. It is not clear to me she has been able to get her stocking on. She has back problems. Mrs. Lamprecht has severe chronic venous insufficiency with secondary lymphedema. Her venous insufficiency is partially centrally mediated and that she is now post stent in the left common iliac vein. [facility] Thurner's syndrome/physiology]. She follows up with them on 10/15. She wears 20/30 below-knee stockings. She is supposed to use compression pumps at home although I think her compliance about with this is been less than 100%. I have asked her to use these 3 times a day. Finally I think she has lipodermatosclerosis in the left lower leg with an inverted bottle sign. It is been a major problem controlling the edema in the left leg. The right leg we have had wounds on but not as significant a problem is on the  left READMISSION 04/12/2021 Mrs. Berrong is a 75 year old woman we know well in this clinic. She has severe chronic venous insufficiency. 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 I believe she has had bilateral greater saphenous vein ablation in the past as well. She tells me that this wound opened sometime in March. She had a fall and thinks it was initially abrasion. She developed areas she describes as little blisters on the anterior part of her leg and she saw dermatology and was treated for methicillin staph aureus with several rounds of antibiotics. She has been using support stockings on the left leg and says this is the only thing she can get on. Her compression pump use maybe once a day she says if she did not use one she use the other. She comes in today with incredible swelling  in the left leg with a wound on the left posterior calf. She has been using Neosporin to this previously a hydrocolloid. 6/15; patient arrives back for 1 week follow-up.Marland Kitchen Apparently her wrap fell down she did not call us to replace this. He has poor edema control. She only uses her compression pumps once a day 6/29; patient presents for 1 week follow-up. She has tolerated the compression wrap well. We have been using Iodoflex under the wrap. She has no issues or complaints today. 7/6; patient presents for 1 week follow-up. She states that the compression wrap rolled down her leg 4 days ago. She has been trying to keep the area covered but has no dressings at home to use. She denies signs of infection. 7/13; patient presents for 1 week follow-up. She states that her compression wrap rolled down her right leg and she called our office and had it placed a few days ago. She has been tolerating the current wrap well. She states that the Iodoflex is causing a burning sensation. She denies signs of infection. 7/27; patient presents for 1 week follow-up She has tolerated the  compression wrap well with Hydrofera Blue underneath. She has now developed a wound to her right lower extremity. She reports having a culture done To this area by her dermatologist that she reports is negative. She currently denies signs of infection. 8/30; patient presents for 1 week follow-up. On the left side she has tolerated the compression wrap well with Hydrofera Blue underneath. She reports some discomfort to her right lower extremity with the 3 layer compression. She currently denies signs of infection. 06/14/2021 upon evaluation today patient's wound actually appears to be doing decently well based on what I am seeing currently. She actually has 2 areas 1 on the left distal/posterior lower leg and the other on the right lower leg. Subsequently the measurements are roughly the same may be slightly smaller but in general have not made any trend towards getting worse which is great news. 06/23/2021 upon evaluation today patient appears to be doing pretty well in regard to her wounds. On the right she is having difficulty with sciatic pain and this subsequently has led to her taking the wrap off over the last week her leg is more swollen she is also been on prednisone for 10 days. With that being said I think we may want to just use a compression sock on the left that way she will not have to worry about the compression wrap being in place that she cannot get off. With that being said I do believe as well that the patient is going to need to continue with the wrapping on the left we will also need to do a little bit of sharp debridement today. 8/24; patient presents for 1 week follow-up. She has no issues or complaints today. She denies signs of infection. 8/31; patient presents for 1 week follow-up. She has no issues or complaints today. She has tolerated the compression wrap well on the left lower extremity. She denies signs of infection. 9/7; patient presents for follow-up. She reports that  the compression wrap rolled down slightly on her right lower extremity. She reports tenderness to the wound bed. She denies increased warmth or erythema to the surrounding skin. She overall feels well. 9/14; patient presents for follow-up. She has no issues or complaints today. She denies signs of infection. PuraPly is available and patient would like to start this today. 9/21; patient presents for follow-up. She has no  issues or complaints today. She tolerated the first skin substitute placement last week under compression. Electronic Signature(s) Signed: 07/26/2021 1:49:48 PM By: Kalman Shan DO Entered By: Kalman Shan on 07/26/2021 13:44:39 Zuluaga, Tenna [name] ([id number]) -------------------------------------------------------------------------------- Physical Exam Details Patient Name: VERNETTE, MOISE. Date of Service: 07/26/2021 1:00 PM Medical Record Number: [id number] Patient Account Number: [id number] Date of Birth/Sex: 05-Dec-1945 (75 y.o. F) Treating RN: Donnamarie Poag Primary Care Provider: [name] Other Clinician: Referring Provider: [name] Treating Provider/Extender: Yaakov Guthrie in Treatment: 15 Constitutional . Psychiatric . Notes Left lower extremity: To the distal medial aspect there is an open wound with nonviable tissue and some scant granulation tissue present. Post debridement there was granulation tissue present. No signs of infection. Electronic Signature(s) Signed: 07/26/2021 1:49:48 PM By: Kalman Shan DO Entered By: Kalman Shan on 07/26/2021 13:45:33 Greathouse, Tenna [name] ([id number]) -------------------------------------------------------------------------------- Physician Orders Details Patient Name: ASHLAND, OSMER. Date of Service: 07/26/2021 1:00 PM Medical Record Number: [id number] Patient Account Number: [id number] Date of Birth/Sex: 11-03-1946 (75 y.o. F) Treating RN: Donnamarie Poag Primary Care Provider: [name] Other Clinician: Referring Provider: [name] Treating Provider/Extender: Yaakov Guthrie in Treatment: 15 Verbal / Phone Orders: No Diagnosis Coding ICD-10 Coding Code Description [phone] Chronic venous hypertension (idiopathic) with ulcer and inflammation of left lower extremity I89.0 Lymphedema, not elsewhere classified L97.221 Non-pressure chronic ulcer of left calf limited to breakdown of skin Follow-up Appointments Wound #13 Left,Distal,Posterior Lower Leg o Return Appointment in 1 week. - Puraply #3 on 08/02/21 o Nurse Visit as needed Bathing/ Shower/ Hygiene o May shower with wound dressing protected with water repellent cover or cast protector. o No tub bath. Anesthetic (Use 'Patient Medications' Section for Anesthetic Order Entry) o Lidocaine applied to wound bed Cellular or Tissue Based Products o Cellular or Tissue Based Product applied to wound bed; including contact layer, fixation with steri-strips, dry gauze and cover dressing. (DO NOT REMOVE). - Puraply #2 applied 07/26/21 Edema Control - Lymphedema / Segmental Compressive Device / Other o Optional: One layer of unna paste to top of compression wrap (to act as an anchor). o Patient to wear own compression stockings. Remove compression stockings every night before going to bed and put on every morning when getting up. - right leg o Elevate, Exercise Daily and Avoid Standing for Long Periods of Time. o Elevate legs to the level of the heart and pump ankles as often as possible o Elevate leg(s) parallel to the floor when sitting. o Compression Pump: Use compression pump on left lower extremity for 60 minutes, twice daily. - TWICE DAILY for 1 hour o Compression Pump: Use compression pump on right lower extremity for 60 minutes, twice daily. - TWICE DAILY for 1 hour Wound Treatment Wound #13 - Lower Leg Wound Laterality: Left, Posterior, Distal Cleanser: Soap and Water 2 x  Per Week/30 Days Discharge Instructions: Gently cleanse wound with antibacterial soap, rinse and pat dry prior to dressing wounds Secondary Dressing: ABD Pad 5x9 (in/in) 2 x Per Week/30 Days Discharge Instructions: Cover with ABD pad Compression Wrap: Medichoice 4 layer Compression System, 35-40 mmHG 2 x Per Week/30 Days Discharge Instructions: Apply multi-layer wrap as directed. Electronic Signature(s) Signed: 07/26/2021 2:02:17 PM By: Kalman Shan DO Signed: 07/26/2021 3:15:27 PM By: Donnamarie Poag Entered By: Donnamarie Poag on 07/26/2021 13:49:12 Aguirre, Tenna [name] ([id number]) -------------------------------------------------------------------------------- Problem List Details Patient Name: JUDYTHE, POSTEMA. Date of Service: 07/26/2021 1:00 PM Medical Record Number: [id number] Patient Account Number: [id number] Date of Birth/Sex: 1946-02-21 (  75 y.o. 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 Date of Service: 07/26/2021  1:00 PM Medical Record Number: [id number] Patient Account Number: [id number] Date of Birth/Sex: October 14, 1946 (75 y.o. F) Treating RN: Donnamarie Poag Primary Care Provider: [name] Other Clinician: Referring Provider: [name] Treating Provider/Extender: Yaakov Guthrie in Treatment: 15 Subjective Chief Complaint Information obtained from Patient Left calf venous stasis ulceration. 11/13/16; the patient is here for a left calf recurrent ulceration which is painful and has been present for the last month 01/22/17; the patient re-presents here for recurrent difficulties with blistering and leaking fluid on her left posterior calf 12/10/18; the patient returns to clinic for a wound on the left medial calf 04/12/2021; left lower extremity wound, 05/31/21; right leg wound History of Present Illness (HPI) Pleasant 75 year old with history of chronic venous insufficiency. No diabetes or peripheral vascular disease. Left ABI 1.29. Questionable history of left lower extremity DVT. She developed a recurrent ulceration on her left lateral calf in December 2015, which she attributes to poor diet and subsequent lower extremity edema. She underwent endovenous laser ablation of her left greater saphenous vein in 2010. She underwent laser ablation of accessory branch of left GSV in April 2016 by Dr. Kellie Simmering at [name]. She was previously wearing Unna boots, which she tolerated well. Tolerating 2 layer compression and cadexomer iodine. She returns to clinic for follow-up and is without new complaints. She denies any significant pain at this time. She reports persistent pain with pressure. No claudication or ischemic rest pain. No fever or chills. No drainage. READMISSION 11/13/16; this is a 75 year old woman who is not a diabetic. She is here for a review of a painful area on her left medial lower extremity. I note that she was seen here previously last year for wound I believe to be in the same  area. At that time she had undergone previously a left greater saphenous vein ablation by Dr. Kellie Simmering and she had a ablation of the anterior accessory branch of the left greater saphenous vein in March 2016. Seeing that the wound actually closed over. In reviewing the history with her today the ulcer in this area has been recurrent. She describes a biopsy of this area in 2009 that only showed stasis physiology. 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 Do not see that she has ever had arterial studies in the left leg. She tells me she has a follow-up with Dr. Kellie Simmering in roughly 10 days In any case she developed the reopening of this area roughly a month ago. On the background  of this she describes rapidly increasing edema which has responded to Lasix 40 mg and metolazone 2.5 mg as well as the patient's lymph massage. She has been told she has both venous insufficiency and lymphedema but she cannot tolerate compression stockings 11/28/16; the patient saw Dr. Kellie Simmering recently. Per the patient he did arterial Dopplers in the office that did not show evidence of arterial insufficiency, per the patient he stated "treat this like an ordinary venous ulcer". She also saw her dermatologist Dr. Ronnald Ramp who felt that this was more of a vascular ulcer. In general things are improving although she arrives today with increasing bilateral lower extremity edema with weeping a deeper fluid through the wound on the left medial leg compatible with some degree of lymphedema 12/04/16; the patient's wound is fully epithelialized but I don't think fully healed. We will do another week of depression with Promogran and TCA however I suspect we'll be able to discharge her next week. This is a very unusual-looking  wound which was initially a figure-of-eight type wound lying on its side surrounded by petechial like hemorrhage. She has had venous ablation on this side. She apparently does not have an arterial issue per Dr. Kellie Simmering. She saw her dermatologist thought it was "vascular". Patient is definitely going to need ongoing compression and I talked about this with her today she will go to elastic therapy after she leaves here next week 12/11/16; the patient's wound is not completely closed today. She has surrounding scar tissue and in further discussion with the patient it would appear that she had ulcers in this area in 2009 for a prolonged period of time ultimately requiring a punch biopsy of this area that only showed venous insufficiency. I did not previously pickup on this part of the history from the patient. 12/18/16; the patient's wound is completely epithelialized. There is no open area here. She has significant bilateral venous insufficiency with secondary lymphedema to a mild-to-moderate degree she does not have compression stockings.. She did not say anything to me when I was in the room, she told our intake nurse that she was still having pain in this area. This isn't unusual recurrent small open area. She is going to go to elastic therapy to obtain compression stockings. 12/25/16; the patient's wound is fully epithelialized. There is no open area here. The patient describes some continued episodic discomfort in this area medial left calf. However everything looks fine and healed here. She is been to elastic therapy and caught herself 15-20 mmHg stockings, they apparently were having trouble getting 20-30 mm stockings in her size 01/22/17; this is a patient we discharged from the clinic a month ago. She has a recurrent open wound on her medial left calf. She had 15 mm support stockings. I told her I thought she needed 20-30 mm compression stockings. She tells me that she has been ill with  hospitalization secondary to asthma and is been found to have severe hypokalemia likely secondary to a combination of Lasix and metolazone. This morning she noted blistering and leaking fluid on the posterior part of her left leg. She called our intake nurse urgently and we was saw her this afternoon. She has not had any real discomfort here. I don't know that she's been wearing any stockings on this leg for at least 2-3 days. ABIs in this clinic were 1.21 on the right and 1.3 on the left. She is previously seen vascular surgery who does not think that there is a peripheral arterial  issue. 01/30/17; Patient arrives with no open wound on the left leg. She has been to elastic therapy and obtained 20-21mmhg below knee stockings and she has one on the right leg today. READMISSION 02/19/18; this B[place]in is a now 75 year old patient we've had in this clinic perhaps 3 times before. I had last looked at her from January 07 December 2016 with an area on the medial left leg. We discharged her on 12/25/16 however she had to be readmitted on 01/22/17 with a recurrence. I have in my notes that we discharged her on 20-30 mm stockings although she tells me she was only wearing support hose because she cannot get stockings on predominantly related to her cervical spine surgery/issues. She has had previous ablations done by vein and vascular in [place] including a great saphenous vein ablation on the left with an anterior accessory branch ablation I think both of these were in 2016. On one of the previous visit she had a biopsy noted 2009 that was negative. She is not felt to have an arterial issue. She is not a diabetic. She does have a [name], [name]. ([id number]) history of obstructive sleep apnea hypertension asthma as well as chronic venous insufficiency and lymphedema. On this occasion she noted 2 dry scaly patch on her left leg. She tried to put lotion on this it didn't really help. There were 2 open  areas.the patient has been seeing her primary physician from 02/05/18 through 02/14/18. She had Unna boots applied. The superior wound now on the lateral left leg has closed but she's had one wound that remains open on the lateral left leg. This is not the same spot as we dealt with in 2018. ABIs in this clinic were 1.3 bilaterally 02/26/18; patient has a small wound on the left lateral calf. Dimensions are down. She has chronic venous insufficiency and lymphedema. 03/05/18; small open area on the left lateral calf. Dimensions are down. Tightly adherent necrotic debris over the surface of the wound which was difficult to remove. Also the dressing [over collagen] stuck to the wound surface. This was removed with some difficulty as well. Change the primary dressing to Hydrofera Blue ready 03/12/18; small open area on the left lateral calf. Comes in with tightly adherent surface eschar as well as some adherent Hydrofera Blue. 03/19/18; open area on the left lateral calf. Again adherent surface eschar as well as some adherent Hydrofera Blue nonviable subcutaneous tissue. She complained of pain all week even with the reduction from 4-3 layer compression I put on last week. Also she had an increase in her ankle and calf measurements probably related to the same thing. 03/26/18; open area on the left lateral calf. A very small open area remains here. We used silver alginate starting last week as the Hydrofera Blue seem to stick to the wound bed. In using 4-layer compression 04/02/18; the open area in the left lateral calf at some adherent slough which I removed there is no open area here. We are able to transition her into her own compression stocking. Truthfully I think this is probably his support hose. However this does not maintain skin integrity will be limited. She cannot put over the toe compression stockings on because of neck problems hand problems etc. She is allergic to the lining layer of juxta lites. We  might be forced to use extremitease stocking should this fail READMIT 11/24/2018 Patient is now a 75 year old woman who is not a diabetic. She has been in this clinic on  at least 3 previous occasions largely with recurrent wounds on her left leg secondary to chronic venous insufficiency with secondary lymphedema. Her situation is complicated by inability to get stockings on and an allergy to neoprene which is apparently a component and at least juxta lites and other stockings. As a result she really has not been wearing any stockings on her legs. She tells [place] that roughly 2 or 3 weeks ago she started noticing a stinging sensation just above her ankle on the left medial aspect. She has been diagnosed with pseudogout and she wondered whether this was what she was experiencing. She tried to dress this with something she bought at the store however subsequently it pulled skin off and now she has an open wound that is not improving. She has been using Vaseline gauze with a cover bandage. She saw her primary doctor last week who put an Haematologist on her. ABIs in this clinic was 1.03 on the left 2/12; the area is on the left medial ankle. Odd-looking wound with what looks to be surface epithelialization but a multitude of small petechial openings. This clearly not closed yet. We have been using silver alginate under 3 layer compression with TCA 2/19; the wound area did not look quite as good this week. Necrotic debris over the majority of the wound surface which required debridement. She continues to have a multitude of what looked to be small petechial openings. She reminds [place] that she had a biopsy on this initially during her first outbreak in 2015 in [place] dermatology. She expresses concern about this being a possible melanoma. 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 I assured her that this does not  look anything like melanoma. Except for the petechial reaction it does look like a venous insufficiency area and she certainly has evidence of this on both sides 2/26; a difficult area on the left medial ankle. The patient clearly has chronic venous hypertension with some degree of lymphedema. The odd thing about the area is the small petechial hemorrhages. I am not really sure how to explain this. This was present last time and this is not a compression injury. We have been using Hydrofera Blue which I changed to last week 3/4; still using Hydrofera Blue. Aggressive debridement today. She does not have known arterial issues. She has seen Dr. Kellie Simmering at [facility] vascular and and has an ablation on the left. [Anterior accessory branch of the greater saphenous]. From what I remember they did not feel she had an arterial issue. The patient has had this area biopsied in 2009 at [facility] dermatology and by her recollection they said this was "stasis". She is also follow-up with dermatology locally who thought that this was more of a vascular issue 3/11; using Hydrofera Blue. Aggressive debridement today. She does not have an arterial issue. We are using 3 layer compression although we may need to go to 4. The patient has been in for multiple changes to her wrap since I last saw her a week ago. She says that the area was leaking. I do not have too much more information on what was found 01/19/19 on evaluation today patient was actually being seen for a nurse visit when unfortunately she had the area on her left lateral lower extremity as well as weeping from the right lower extremity that became apparent. Therefore we did end up actually seeing her for a  full visit with myself. She is having some pain at this site as well but fortunately nothing too significant at this point. No fevers, chills, nausea, or vomiting noted at this time. 3/18-Patient is back to the clinic with the left leg venous leg  ulcer, the ulcer is larger in size, has a surface that is densely adherent with fibrinous tissue, the Hydrofera Blue was used but is densely adherent and there was difficulty in removing it. The right lower extremity was also wrapped for weeping edema. Patient has a new area over the left lateral foot above the malleolus that is small and appears to have no debris with intact surrounding skin. Patient is on increased dose of Lasix also as a means to edema management 3/25; the patient has a nonhealing venous ulcer on the medial left leg and last week developed a smaller area on the lateral left calf. We have been using Hydrofera Blue with a contact layer. 4/1; no major change in these wounds areas. Left medial and more recently left lateral calf. I tried Iodoflex last week to aid in debridement she did not tolerate this. She stated her pain was terrible all week. She took the top layer of the 4 layer compression off. 4/8; the patient actually looks somewhat better in terms of her more prominent left lateral calf wound. There is some healthy looking tissue here. She is still complaining of a lot of discomfort. 4/15; patient in a lot of pain secondary to sciatica. She is on a prednisone taper prescribed by her primary physician. She has the 2 areas one on the left medial and more recently a smaller area on the left lateral calf. Both of these just above the malleoli 4/22; her back pain is better but she still states she is very uncomfortable and now feels she is intolerant to the [employer]. No real change in the wounds we have been using Sorbact. She has been previously intolerant to Iodoflex. There is not a lot of option about what we can use to debride this wound under compression that she no doubt needs. sHe states Ultram no longer works for her pain 4/29; no major change in the wounds slightly increased depth. Surface on the original medial wound perhaps somewhat improved however the more recent  area on the lateral left ankle is 100% covered in very adherent debris we have been using Sorbact. She tolerates 4 layer compression well and her edema control is a lot better. She has not had to come in for a nurse check 5/6; no major change in the condition of the wounds. She did consent to debridement today which was done with some difficulty. Continuing Sorbact. She did not tolerate Iodoflex. She was in for a check of her compression the day after we wrapped her last week this was adjusted but LECIA, ESPERANZA. ([id number]) nothing much was found 5/13; no major change in the condition or area of the wounds. I was able to get a fairly aggressive debridement done on the lateral left leg wound. Even using Sorbact under compression. She came back in on Friday to have the wrap changed. She says she felt uncomfortable on the lateral aspect of her ankle. She has a long history of chronic venous insufficiency including previous ablation surgery on this side. 5/20-Patient returns for wounds on left leg with both wounds covered in slough, with the lateral leg wound larger in size, she has been in 3 layer compression and felt more comfortable, she describes pain  in ankle, in leg and pins and needles in foot, and is about to try Pamelor for this 6/3; wounds on the left lateral and left medial leg. The area medially which is the most recent of the 2 seems to have had the largest increase in dimensions. We have been using Sorbac to try and debride the surface. She has been to see orthopedics they apparently did a plain x-ray that was indeterminant. Diagnosed her with neuropathy and they have ordered an MRI to determine if there is underlying osteomyelitis. This was not high on my thought list but I suppose it is prudent. We have advised her to make an appointment with vein and vascular in [place]. She has a history of a left greater saphenous and accessory vein ablations I wonder if there is anything else that  can be done from a surgical point of view to help in these difficult refractory wounds. We have previously healed this wound on one occasion but it keeps on reopening [medial side] 6/10; deep tissue culture I did last week I think on the left medial wound showed both moderate E. coli and moderate staph aureus [MSSA]. She is going to require antibiotics and I have chosen Augmentin. We have been using Sorbact and we have made better looking wound surface on both sides but certainly no improvement in wound area. She was back in last Friday apparently for a dressing changes the wrap was hurting her outer left ankle. She has not managed to get a hold of vein and vascular in [place]. We are going to have to make her that appointment 6/17; patient is tolerating the Augmentin. She had an MRI that I think was ordered by orthopedic surgeon this did not show osteomyelitis or an abscess did suggest cellulitis. We have been using Sorbact to the lateral and medial ankles. We have been trying to arrange a follow-up appointment with vein and vascular in [place] or did her original ablations. We apparently an area sent the request to vein and vascular in [facility] 6/24; patient has completed the Augmentin. We do not yet have a vein and vascular appointment in [place]. I am not sure what the issue is here we have asked her to call tomorrow. We are using Sorbact. Making some improvements and especially the medial wound. Both surfaces however look better medial and lateral. 7/1; the patient has been in contact with vein and vascular in [name] but has not yet received an appointment. Using Sorbact we have gradually improve the wound surface with no improvement in surface area. She is approved for Apligraf but the wound surface still is not completely viable. She has not had to come in for a dressing change 7/8; the patient has an appointment with vein and vascular on 7/31 which is a Friday afternoon. She is  concerned about getting back here for [place] to dress her wounds. I think it is important to have them goal for her venous reflux/history of ablations etc. to see if anything else can be done. She apparently tested positive for 1 of the blood tests with regards to lupus and saw a rheumatologist. He has raised the issue of vasculitis again. I have had this thought in the past however the evidence seems overwhelming that this is a venous reflux etiology. If the rheumatologist tells me there is clinical and laboratory investigation is positive for lupus I will rethink this. 7/15; the patient's wound surfaces are quite a bit better. The medial area which was her original wound  now has no depth although the lateral wound which was the more recent area actually appears larger. Both with viable surfaces which is indeed better. Using Sorbact. I wanted to use Apligraf on her however there is the issue of the vein and vascular appointment on 7/31 at 2:00 in the afternoon which would not allow her to get back to be rewrapped and they would no doubt remove the graft 7/22; the patient's wound surfaces have moderate amount of debris although generally look better. The lateral one is larger with 2 small satellite areas superiorly. We are waiting for her vein and vascular appointment on 7/31. She has been approved for Apligraf which I would like to use after th 7/29; wound surfaces have improved no debridement is required we have been using Sorbact. She sees vein and vascular on Friday with this so question of whether anything can be done to lessen the likelihood of recurrence and/or speed the healing of these areas. She is already had previous ablations. She no doubt has severe venous hypertension 8/5-Patient returns at 1 week, she was in [place] for 3 days by her podiatrist, we have been using so backed to the wound, she has increased pain in both the wounds on the left lower leg especially the more distal one on  the lateral aspect 8/12-Patient returns at 1 week and she is agreeable to having debridement in both wounds on her left leg today. We have been using Sorbact, and vascular studies were reviewed at last visit 8/19; the patient arrives with her wounds fairly clean and no debridement is required. We have used Sorbact which is really done a nice job in cleaning up these very difficult wound surfaces. The patient saw Dr. Donzetta Matters of vascular surgery on 7/31. He did not feel that there was an arterial component. He felt that her treated greater saphenous vein is adequately addressed and that the small saphenous vein did not appear to be involved significantly. She was also noted to have deep venous reflux which is not treatable. Dr. Donzetta Matters mentioned the possibility of a central obstructive component leading to reflux and he offered her central venography. She wanted to discuss this or think about it. I have urged her to go ahead with this. She has had recurrent difficult wounds in these areas which do heal but after months in the clinic. If there is anything that can be done to reduce the likelihood of this I think it is worth it. 9/2 she is still working towards getting follow-up with Dr. Donzetta Matters to schedule her CT. Things are quite a bit worse venography. I put Apligraf on 2 weeks ago on both wounds on the medial and lateral part of her left lower leg. She arrives in clinic today with 3 superficial additional wounds above the area laterally and one below the wound medially. She describes a lot of discomfort. I think these are probably wrapped injuries. Does not look like she has cellulitis. 07/20/2019 on evaluation today patient appears to be doing somewhat poorly in regard to her lower extremity ulcers. She in fact showed signs of erythema in fact we may even be dealing with an infection at this time. Unfortunately I am unsure if this is just infection or if indeed there may be some allergic reaction that  occurred as a result of the Apligraf application. With that being said that would be unusual but nonetheless not impossible in this patient is one who is unfortunately allergic to quite a bit. Currently we  have been using the Sorbact which seems to do as well as anything for her. I do think we may want to obtain a culture today to see if there is anything showing up there that may need to be addressed. 9/16; noted that last week the wounds look worse in 1 week follow-up of the Apligraf. Using Sorbact as of 2 days ago. She arrives with copious amounts of drainage and new skin breakdown on the back of the left calf. The wounds arm more substantial bilaterally. There is a fair amount of swelling in the left calf no overt DVT there is edema present I think in the left greater than right thigh. She is supposed to go on 9/28 for CT venography. The wounds on the medial and lateral calf are worse and she has new skin breakdown posteriorly at least new for me. This is almost developing into a circumferential wound area The Apligraf was taken off last week which I agree with things are not going in the right direction a culture was done we do not have that back yet. She is on Augmentin that she started 2 days ago 9/23; dressing was changed by her nurses on Monday. In general there is no improvement in the wound areas although the area looks less angry than last week. She did get Augmentin for MSSA cultured on the 14th. She still appears to have too much swelling in the left leg even with 3 layer compression 9/30; the patient underwent her procedure on 9/28 by Dr. Donzetta Matters at vascular and vein specialist. She was discovered to have the common iliac vein measuring 12.2 mm but at the level of L4-L5 measured 3 mm. After stenting it measured 10 mm. 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 Rouleaux flow in the common femoral and femoral vein was observed much improved after stenting. We are using silver  alginate to the wounds on the medial and lateral ankle on the left. 4 layer compression [name], [name] ([id number]) 10/7; the patient had fluid swelling around her knee and 4 layer compression. At the advice of vein and vascular this was reduced to 3 layer which she is tolerating better. We have been using silver alginate under 3 layer compression since last Friday 10/14; arrives with the areas on the left ankle looking a lot better. Inflammation in the area also a lot better. She came in for a nurse check on 10/9 10/21; continued nice improvement. Slight improvements in surface area of both the medial and lateral wounds on the left. A lot of the satellite lesions in the weeping erythema around these from stasis dermatitis is resolved. We have been using silver alginate 10/28; general improvement in the entire wound areas although not a lot of change in dimensions the wound certainly looks better. There is a lot less in terms of venous inflammation. Continue silver alginate this week however look towards Hydrofera Blue next week 11/4; very adherent debris on the medial wound left wound is not as bad. We have been using silver alginate. Change to [facility] today 11/11; very adherent debris on both wound areas. She went to vein and vascular last week and follow-up they put in [place] boot on this today. He says the [facility] was adherent. Wound is definitely not as good as last week. Especially on the left there the satellite lesions look more prominent 11/18; absolutely no better. erythema on lateral aspect with tenderness. 09/30/2019 on evaluation today patient appears to actually be  doing better. Dr. Dellia Nims did put her on doxycycline last week which I do believe has helped her at this point. Fortunately there is no signs of active infection at this time. No fevers, chills, nausea, vomiting, or diarrhea. I do believe he may want extend the doxycycline for 7 additional days just to ensure  everything does completely cleared up the patient is in agreement with that plan. Otherwise she is going require some sharp debridement today 12/2; patient is completing a 2-week course of doxycycline. I gave her this empirically for inflammation as well as infection when I last saw her 2 weeks ago. All of this seems to be better. She is using silver alginate she has the area on the medial aspect of the larger area laterally and the 2 small satellite regions laterally above the major wound. 12/9; the patient's wound on the left medial and left lateral calf look really quite good. We have been using silver alginate. She saw vein and vascular in follow-up on 10/09/2019. She has had a previous left greater saphenous vein ablation by Dr. Oscar La in 2016. More recently she underwent a left common iliac vein stent by Dr. 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 The swelling is improved and certainly the wounds have improved. The patient shows [place] today area on the right medial calf there is almost no wound but leaking lymphedema. She says she start this started 3 or 4 days ago. She did not traumatize it. It is not painful. She does not wear compression on that side 12/16; the patient continues to do well laterally. Medially still requiring debridement. The area on the right calf did not materialize to anything and is not currently open. We wrapped this last time. She has support stockings for that leg although I am not sure they are going to provide adequate compression 12/23; the lateral wound looks stable. Medially still requiring debridement for tightly adherent fibrinous debris. We've been using silver alginate. Surface area not any different 12/30; neither wound is any better with regards to surface and the area on the left lateral is larger. I been using silver alginate to the left lateral which look quite good last week and Sorbact to the left medial 11/11/2019. Lateral wound area actually  looks better and somewhat smaller. Medial still requires a very aggressive debridement today. We have been using Sorbact on both wound areas 1/13; not much better still adherent debris bilaterally. I been using Sorbact. She has severe venous hypertension. Probably some degree of dermal fibrosis distally. I wonder whether tighter compression might help and I am going to try that today. We also need to work on the bioburden 1/20; using Sorbact. She has severe venous hypertension status post stent placement for pelvic vein compression. We applied gentamicin last time to see if we could reduce bioburden I had some discussion with her today about the use of pentoxifylline. This is occasionally used in this setting for wounds with refractory venous insufficiency. However this interacts with Plavix. She tells me that she was put on this after stent placement for 3 months. She will call Dr. Claretha Cooper office to discuss 1/27; we are using gentamicin under Sorbact. 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 She has a stent. Wound medially is measuring smaller this week. Laterally measuring slightly larger although she has some satellite lesions superiorly 2/3; gentamicin under Sorbact under 4-layer compression. 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 She has a stent on Plavix. Her  wounds are measuring smaller this week. More substantially laterally where there is a satellite lesion superiorly. 2/10; gentamicin under Sorbac. 4-layer compression. Patient communicated with Dr. Donzetta Matters at vein and vascular in [place]. He is okay with the patient coming off Plavix I will therefore start her on pentoxifylline for a 1 month trial. In general her wounds look better today. I had some concerns about swelling in the left thigh however she measures 61.5 on the right and 63 on the mid thigh which does not suggest there is any difficulty. The patient is not describing any  pain. 2/17; gentamicin under Sorbac 4-layer compression. She has been on pentoxifylline for 1 week and complains of loose stool. No nausea she is eating and drinking well 2/24; the patient apparently came in 2 days ago for a nurse visit when her wrap fell down. Both areas look a little worse this week macerated medially and satellite lesions laterally. Change to silver alginate today 3/3; wounds are larger today especially medially. She also has more swelling in her foot lower leg and I even noted some swelling in her posterior thigh which is tender. I wonder about the patency of her stent. Fortuitously she sees Dr. Claretha Cooper group on Friday 3/10; Mrs. Riddle was seen by vein and vascular on 3/5. The patient underwent ultrasound. There was no evidence of thrombosis involving the IVC no evidence of thrombosis involving the right common iliac vein there is no evidence of thrombosis involving the right external iliac vein the left external vein is also patent. The right common iliac vein stent appears patent bilateral common femoral veins are compressible and appear patent. I was concerned about the left common iliac stent however it looks like this is functional. She has some edema in the posterior thigh that was tender she still has that this week. I also note they had trouble finding the pulses in her left foot and booked her for an ABI baseline in 4 weeks. She will follow up in 6 months for repeat IVC duplex. The patient stopped the pentoxifylline because of diarrhea. It does not look like that was being effective in any case. I have advised her to go back on her aspirin 81 mg tablet, vascular it also suggested this 3/17; comes in today with her wound surfaces a lot better. The excoriations from last week considerably better probably secondary to the TCA. We have been using silver alginate 3/24; comes in today with smaller wounds both medially and laterally. Both required debridement. There are 2  small satellite areas superiorly laterally. She also has a very odd bandlike area in the mid calf almost looking like there was a weakness in the wrap in a localized area. I would write this off as being this however anteriorly she has a small raised ballotable area that is very tender almost reminiscent of an abscess but there was no [name], [name]. ([id number]) obvious purulent surface to it. 02/04/20 upon evaluation today patient appears to be doing fairly well in regard to her wounds today. Fortunately there is no signs of active infection at this time. No fevers, chills, nausea, vomiting, or diarrhea. She has been tolerating the dressing changes without complication. Fortunately I feel like she is showing signs of improvement although has been sometime since have seen her. Nonetheless the area of concern that Dr. Dellia Nims had last week where she had possibly an area of the wrap that was we can allow the leg to bulge appears to be doing significantly better today  there is no signs of anything worsening. 4/7; the patient's wounds on her medial and lateral left leg continue to contract. We have been using a regular alginate. Last week she developed an area on the right medial lower leg which is probably a venous ulcer as well. 4/14; the wounds on her left medial and lateral lower leg continue to contract. Surface eschar. We have been using regular alginate. The area on the right medial lower leg is closed. We have been putting both legs under 4-layer contraction. The patient went back to see vein and vascular she had arterial studies done which were apparently "quite good" per the patient although I have not read their notes I have never felt she had an arterial issue. 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 This is been longstanding and refractory to exercise, leg elevation and longstanding use of compression wraps in our clinic as well as compression  stockings on the times we have been able to get these to heal 4/21; we thought she actually might be close this week however she arrives in clinic with a lot of edema in her upper left calf and into her posterior thigh. This is been an intermittent problem here. She says the wrap fell down but it was replaced with a nurse visit on Monday. We are using calcium alginate to the wounds and the wound sizes there not terribly larger than last week but there is a lot more edema 4/28; again wound edges are smaller on both sides. Her edema is better controlled than last time. She is obtained her compression pumps from [employer] although they have not been to her home to set these up. 5/5; left medial and left lateral both look stable. I am not sure the medial is any smaller. We have been using calcium alginate under 4-layer compression. She had an area on the right medial. This was eschared today. We have been wrapping this as well. She does not tolerate external compression stockings due to a history of various contact allergies. She has her compression pumps however the representative from the company is coming on her to show her how to use these tomorrow 5/19; patient with severe chronic venous insufficiency secondary to central venous disease. She had a stent placed in her left common iliac vein. She has done better since but still difficult to control wounds. She comes in today with nothing open on the right leg. Her areas on the left medial and left lateral are just about closed. We are using calcium alginate under 4-layer compression. She is using her external compression pumps at home She only has 15-20 support stockings. States she cannot get anything tighter than that on. 03/30/20-Patient returns at 1 week, the wounds on the left leg are both slightly bigger, the last week she was on 3 layer compression which started to slide down. She is starting to use her lymphedema pumps although she  stated on 1 day her right ankle started to swell up and she have to stop that day. Unfortunately the open area seem to oscillate between improving to the point of healing and then flaring up all to do with effectiveness of compression or lack of due to the left leg topography not keeping the compression wraps from rolling down 6/2 patient comes in with a 15/20 mmHg stocking on the right leg. She tells me that she developed a lot of swelling in her ankles she saw orthopedics she was felt to possibly  be having a flare of pseudogout versus some other type of arthritis. She was put on steroids for a respiratory issue so that helps with the inflammation. She has not been using the pumps all week. She thinks the left thigh is more swollen than usual and I would agree with that. She has an appointment with Dr. Donzetta Matters 9 days or so from now 6/9; both wounds on the left medial and left lateral are smaller. We have been using calcium alginate under compression. She does not have an open wound on the right leg she is using a stocking and her compression pumps things are going well. She has an appointment with Dr. Donzetta Matters with regards to her stent in the left common iliac vein 6/16; the wounds on the left medial and left lateral ankle continues to contract. The patient saw Dr. Donzetta Matters and I think he seems satisfied. Ordered follow-up venous reflux studies on both sides in September. Cautioned that she may need thigh-high stockings. She has been using calcium alginate under compression on the left and her own stocking on the right leg. She tells [place] there are no open wounds on the right 6/23; left lateral is just about closed. Medial required debridement today. We have been using calcium alginate. Extensive discussion about the compression pumps she is only using these on 25 mmHg states she could not take 40 or 30 when the wrap came out to her home to demonstrate these. He said they should not feel tight 6/30; the left  lateral wound has a slight amount of eschar. . The area medially is about the same using Hydrofera Blue. 7/7; left lateral wound still has some eschar. I will remove this next week may be closed. The area medially is very small using Hydrofera Blue with improvement. Unfortunately the stockings fell down. Unfortunately the blisters have developed at the edge of where the wrap fell. When this happened she says her legs hurt she did not use her pumps. We are not open Monday for her to come in and change the wraps and she had an appointment yesterday. She also tells me that she is going to have an MRI of her back. She is having pain radiating into her left anterior leg she thinks her from an L5 disc. She saw Dr. Ellene Route of neurosurgery 7/14; the area on the left lateral ankle area is closed. Still a small area medially however it looks better as well. We have been using Hydrofera Blue under 4-layer compression 7/21; left lateral ankle is still closed however her wound on the medial left calf is actually larger. This is probably because Hydrofera Blue got stuck to the wound. She came in for a nurse change on Friday and will do that again this week I was concerned about the amount of swelling that she had last week however she is using her compression pumps twice a day and the swelling seems well controlled 7/28; remaining wound on the left medial lower leg is smaller. We have been using moistened silver collagen under compression she is coming back for a nurse visit. For reasons that were not really clear she was just keeping her legs elevated and not using her compression pumps. I have asked her to use the compression pumps. She does not have any wounds on the right leg 06/15/20-Patient returns at 2 weeks, her LLE edema is worse and she developed a blister wound that is new and has bigger posterior calf wound on right, we are using Prisma  with pad, 4 layer compression. she has been on lasix 40 mg  daily 8/18; patient arrives today with things a lot worse than I remember from a few weeks ago. She was seen last week. Noted that her edema was worse and that she now had a left lateral wound as well as deteriorating edema in the medial and posterior part of the lower leg. She says she is using his or her external compression pumps once a day although I wonder about the compliance. 8/25; weeping area on the right medial lower leg. This had actually gotten a small localized area of her compression stocking wet. On the left side there is a large denuded area on the posterior medial lower leg and smaller area on the lateral. This was not the original areas that we dealt with. 9/1 the patient's wound on the left leg include the left lateral and left posterior. Larger superficial wounds weeping. She has very poor edema control. Tender localized edema in the left lower medial ankle/heel probably because of localized wrap issues. She freely admits she is not using Fogleman, [place] ([id number]) the compression pumps. She has been up on her feet a lot. She thinks the hydrofera blue is contributing to the pain she is experiencing.. This is a complaint that I have occasionally heard 9/8; really not much improvement. The patient is still complaining of a lot of pain particularly when she uses compression pumps. I switched her to silver alginate last time because she found the Hydrofera Blue to be irritating. I don't hear much difference in her description with the silver alginate. 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 She has follow-up with Dr. Donzetta Matters I think for ultrasound next month 9/15; quite a bit of improvement today. We have less edema and more epithelialization in both of her wound areas on the left medial and left lateral calf. These are not the site of her original wounds in this area. She says she has been using her  compression pumps for 30 minutes twice a day, there pain issues that never quite understood. Silver alginate as the primary dressing 9/22; continued improvement. Both areas medially and laterally still have a small open area there is some eschar. She continues to complain of left medial ankle pain. Swelling in the leg is in much better condition. We have been using silver alginate 9/29; continued improvement. Both areas medially and laterally in the left calf look as though they are close some minor surface eschar but I think this is epithelialized. She comes in today saying she has a ruptured disc at L4-L5 cannot bend over to put on her stockings. 10/6; patient comes in today with no open wounds on either leg. However her edema on the left leg in the upper one third of the lower leg is poorly controlled nonpitting. She says that she could not use the pumps for 2 days and then she has been using the last couple of days. It is not clear to me she has been able to get her stocking on. She has back problems. Mrs. Calef has severe chronic venous insufficiency with secondary lymphedema. Her venous insufficiency is partially centrally mediated and that she is now post stent in the left common iliac vein. [facility] Thurner's syndrome/physiology]. She follows up with them on 10/15. She wears 20/30 below-knee stockings. She is supposed to use compression pumps at home although I think  her compliance about with this is been less than 100%. I have asked her to use these 3 times a day. Finally I think she has lipodermatosclerosis in the left lower leg with an inverted bottle sign. It is been a major problem controlling the edema in the left leg. The right leg we have had wounds on but not as significant a problem is on the left READMISSION 04/12/2021 Mrs. Hannan is a 75 year old woman we know well in this clinic. She has severe chronic venous insufficiency. 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 I believe she has had bilateral greater saphenous vein ablation in the past as well. She tells me that this wound opened sometime in March. She had a fall and thinks it was initially abrasion. She developed areas she describes as little blisters on the anterior part of her leg and she saw dermatology and was treated for methicillin staph aureus with several rounds of antibiotics. She has been using support stockings on the left leg and says this is the only thing she can get on. Her compression pump use maybe once a day she says if she did not use one she use the other. She comes in today with incredible swelling in the left leg with a wound on the left posterior calf. She has been using Neosporin to this previously a hydrocolloid. 6/15; patient arrives back for 1 week follow-up.Marland Kitchen Apparently her wrap fell down she did not call us to replace this. He has poor edema control. She only uses her compression pumps once a day 6/29; patient presents for 1 week follow-up. She has tolerated the compression wrap well. We have been using Iodoflex under the wrap. She has no issues or complaints today. 7/6; patient presents for 1 week follow-up. She states that the compression wrap rolled down her leg 4 days ago. She has been trying to keep the area covered but has no dressings at home to use. She denies signs of infection. 7/13; patient presents for 1 week follow-up. She states that her compression wrap rolled down her right leg and she called our office and had it placed a few days ago. She has been tolerating the current wrap well. She states that the Iodoflex is causing a burning sensation. She denies signs of infection. 7/27; patient presents for 1 week follow-up She has tolerated the compression wrap well with Hydrofera Blue underneath. She has now developed a wound to her right lower extremity. She reports having a culture done To this area by her dermatologist that she reports is  negative. She currently denies signs of infection. 8/30; patient presents for 1 week follow-up. On the left side she has tolerated the compression wrap well with Hydrofera Blue underneath. She reports some discomfort to her right lower extremity with the 3 layer compression. She currently denies signs of infection. 06/14/2021 upon evaluation today patient's wound actually appears to be doing decently well based on what I am seeing currently. She actually has 2 areas 1 on the left distal/posterior lower leg and the other on the right lower leg. Subsequently the measurements are roughly the same may be slightly smaller but in general have not made any trend towards getting worse which is great news. 06/23/2021 upon evaluation today patient appears to be doing pretty well in regard to her wounds. On the right she is having difficulty with sciatic pain and this subsequently has led to her taking the wrap off over  the last week her leg is more swollen she is also been on prednisone for 10 days. With that being said I think we may want to just use a compression sock on the left that way she will not have to worry about the compression wrap being in place that she cannot get off. With that being said I do believe as well that the patient is going to need to continue with the wrapping on the left we will also need to do a little bit of sharp debridement today. 8/24; patient presents for 1 week follow-up. She has no issues or complaints today. She denies signs of infection. 8/31; patient presents for 1 week follow-up. She has no issues or complaints today. She has tolerated the compression wrap well on the left lower extremity. She denies signs of infection. 9/7; patient presents for follow-up. She reports that the compression wrap rolled down slightly on her right lower extremity. She reports tenderness to the wound bed. She denies increased warmth or erythema to the surrounding skin. She overall feels  well. 9/14; patient presents for follow-up. She has no issues or complaints today. She denies signs of infection. PuraPly is available and patient would like to start this today. 9/21; patient presents for follow-up. She has no issues or complaints today. She tolerated the first skin substitute placement last week under compression. Patient History TANYLA, STEGE ([id number]) Information obtained from Patient. Family History Cancer - Mother,Paternal Grandparents, Hypertension - Mother,Father, Kidney Disease - Paternal Grandparents, Lung Disease - Paternal Grandparents, No family history of Diabetes, Heart Disease, Hereditary Spherocytosis, Seizures, Stroke, Thyroid Problems, Tuberculosis. Social History Former smoker - quit at age 64 - ended on 04/19/1966, Marital Status - Divorced, Alcohol Use - Never, Drug Use - No History, Caffeine Use - Daily. Medical History Eyes Patient has history of Cataracts Denies history of Glaucoma, Optic Neuritis Ear/Nose/Mouth/Throat Denies history of Chronic sinus problems/congestion, Middle ear problems Hematologic/Lymphatic Denies history of Anemia, Hemophilia, Human Immunodeficiency Virus, Lymphedema, Sickle Cell Disease Respiratory Patient has history of Asthma, Sleep Apnea Denies history of Aspiration, Chronic Obstructive Pulmonary Disease (COPD), Pneumothorax, Tuberculosis Cardiovascular Patient has history of Deep Vein Thrombosis - LLE 2010, Hypertension, Peripheral Venous Disease Denies history of Angina, Arrhythmia, Congestive Heart Failure, Coronary Artery Disease, Hypotension, Myocardial Infarction, Peripheral Arterial Disease, Phlebitis, Vasculitis Gastrointestinal Denies history of Cirrhosis , Colitis, Crohn s, Hepatitis A, Hepatitis B, Hepatitis C Endocrine Denies history of Type I Diabetes, Type II Diabetes Genitourinary Denies history of End Stage Renal Disease Immunological Denies history of Lupus Erythematosus, Raynaud s,  Scleroderma Integumentary (Skin) Denies history of History of Burn, History of pressure wounds Musculoskeletal Patient has history of Osteoarthritis - neck Denies history of Gout, Rheumatoid Arthritis, Osteomyelitis Neurologic Denies history of Dementia, Neuropathy, Quadriplegia, Paraplegia, Seizure Disorder Oncologic Patient has history of Received Chemotherapy - interfeon immunotherapy, Received Radiation Psychiatric Denies history of Anorexia/bulimia, Confinement Anxiety Hospitalization/Surgery History - Melanoma left shoulder. Medical And Surgical History Notes Constitutional Symptoms (General Health) Vein ablation LLE 2010 Vascular Sx ( vein ablationo) LLE 02/2015 Eyes Horners syndrome Genitourinary Kidney stones Neurologic ct scan showed swollen lymph nodes Oncologic Melanoma (R) shoulder 07/2010; (R) axillary lymphnode removal Objective Constitutional Vitals Time Taken: 1:04 PM, Height: 63 in, Weight: 212 lbs, BMI: 37.6, Temperature: 98.3 F, Pulse: 83 bpm, Respiratory Rate: 18 breaths/min, Blood Pressure: 161/79 mmHg. General Notes: Left lower extremity: To the distal medial aspect there is an open wound with nonviable tissue and some scant granulation tissue [name], [name]. ([id number])  present. Post debridement there was granulation tissue present. No signs of infection. Integumentary (Hair, Skin) Wound #13 status is Open. Original cause of wound was Gradually Appeared. The date acquired was: 02/24/2021. The wound has been in treatment 15 weeks. The wound is located on the Left,Distal,Posterior Lower Leg. The wound measures 0.6cm length x 0.5cm width x 0.1cm depth; 0.236cm^2 area and 0.024cm^3 volume. There is Fat Layer (Subcutaneous Tissue) exposed. There is no tunneling or undermining noted. There is a medium amount of serosanguineous drainage noted. There is medium (34-66%) red, pink granulation within the wound bed. There is a medium (34-66%) amount of necrotic tissue  within the wound bed including Adherent Slough. Assessment Active Problems ICD-10 Chronic venous hypertension (idiopathic) with ulcer and inflammation of left lower extremity Lymphedema, not elsewhere classified Non-pressure chronic ulcer of left calf limited to breakdown of skin Patient's wound has shown improvement in size and appearance since last clinic visit with the first application of PuraPly AM. Nonviable tissue was debrided and the second application of PuraPly was placed in office today in standard fashion. No signs of infection on exam. I recommended continuing this under compression therapy. Follow-up in 1 week Procedures Wound #13 Pre-procedure diagnosis of Wound #13 is a Venous Leg Ulcer located on the Left,Distal,Posterior Lower Leg .Severity of Tissue Pre Debridement is: Fat layer exposed. There was a Excisional Skin/Subcutaneous Tissue Debridement with a total area of 0.3 sq cm performed by Kalman Shan, MD. With the following instrument(s): Curette Material removed includes Subcutaneous Tissue and Slough and after achieving pain control using Lidocaine. A time out was conducted at 15:37, prior to the start of the procedure. A Minimum amount of bleeding was controlled with Pressure. The procedure was tolerated well. Post Debridement Measurements: 0.6cm length x 0.5cm width x 0.1cm depth; 0.024cm^3 volume. Character of Wound/Ulcer Post Debridement is improved. Severity of Tissue Post Debridement is: Fat layer exposed. Post procedure Diagnosis Wound #13: Same as Pre-Procedure Plan 1. PuraPly #2 placed in standard fashion 2. Compression wrap 3. Follow-up in 1 week 4. In office sharp debridement Electronic Signature(s) Signed: 07/26/2021 1:49:48 PM By: Kalman Shan DO Entered By: Kalman Shan on 07/26/2021 13:48:06 [place], Tenna [name] ([id number]) -------------------------------------------------------------------------------- ROS/PFSH Details Patient Name: HESTER, JOSLIN. Date of Service: 07/26/2021 1:00 PM Medical Record Number: [id number] Patient Account Number: [id number] Date of Birth/Sex: 1946-02-02 (75 y.o. F) Treating RN: Donnamarie Poag Primary Care Provider: [name] Other Clinician: Referring Provider: [name] Treating Provider/Extender: Yaakov Guthrie in Treatment: 15 Label Progress Note Print Version as History and Physical for this encounter Information Obtained From Patient Constitutional Symptoms (General Health) Medical History: Past Medical History Notes: Vein ablation LLE 2010 Vascular Sx ( vein ablationo) LLE 02/2015 Eyes Medical History: Positive for: Cataracts Negative for: Glaucoma; Optic Neuritis Past Medical History Notes: Horners syndrome Ear/Nose/Mouth/Throat Medical History: Negative for: Chronic sinus problems/congestion; Middle ear problems Hematologic/Lymphatic Medical History: Negative for: Anemia; Hemophilia; Human Immunodeficiency Virus; Lymphedema; Sickle Cell Disease Respiratory Medical History: Positive for: Asthma; Sleep Apnea Negative for: Aspiration; Chronic Obstructive Pulmonary Disease (COPD); Pneumothorax; Tuberculosis Cardiovascular Medical History: Positive for: Deep Vein Thrombosis - LLE 2010; Hypertension; Peripheral Venous Disease Negative for: Angina; Arrhythmia; Congestive Heart Failure; Coronary Artery Disease; Hypotension; Myocardial Infarction; Peripheral Arterial Disease; Phlebitis; Vasculitis Gastrointestinal Medical History: Negative for: Cirrhosis ; Colitis; Crohnos; Hepatitis A; Hepatitis B; Hepatitis C Endocrine Medical History: Negative for: Type I Diabetes; Type II Diabetes Genitourinary Medical History: Negative for: End Stage Renal Disease Past Medical History Notes: Kidney stones  TOMEKIA, HELTON ([id number]) Immunological Medical History: Negative for: Lupus Erythematosus; Raynaudos; Scleroderma Integumentary (Skin) Medical History: Negative  for: History of Burn; History of pressure wounds Musculoskeletal Medical History: Positive for: Osteoarthritis - neck Negative for: Gout; Rheumatoid Arthritis; Osteomyelitis Neurologic Medical History: Negative for: Dementia; Neuropathy; Quadriplegia; Paraplegia; Seizure Disorder Past Medical History Notes: ct scan showed swollen lymph nodes Oncologic Medical History: Positive for: Received Chemotherapy - interfeon immunotherapy; Received Radiation Past Medical History Notes: Melanoma (R) shoulder 07/2010; (R) axillary lymphnode removal Psychiatric Medical History: Negative for: Anorexia/bulimia; Confinement Anxiety HBO Extended History Items Eyes: Cataracts Immunizations Pneumococcal Vaccine: Received Pneumococcal Vaccination: Yes Received Pneumococcal Vaccination On or After 60th Birthday: No Immunization Notes: tetanus shot w/in the last 5 years per pt Implantable Devices None Hospitalization / Surgery History Type of Hospitalization/Surgery Melanoma left shoulder Family and Social History Cancer: Yes - Mother,Paternal Grandparents; Diabetes: No; Heart Disease: No; Hereditary Spherocytosis: No; Hypertension: Yes - Mother,Father; Kidney Disease: Yes - Paternal Grandparents; Lung Disease: Yes - Paternal Grandparents; Seizures: No; Stroke: No; Thyroid Problems: No; Tuberculosis: No; Former smoker - quit at age 89 - ended on 04/19/1966; Marital Status - Divorced; Alcohol Use: Never; Drug Use: No History; Caffeine Use: Daily; Financial Concerns: No; Food, Clothing or Shelter Needs: No; Support System Lacking: No; Transportation Concerns: No Electronic Signature(s) Signed: 07/26/2021 1:49:48 PM By: Kalman Shan DO Signed: 07/26/2021 3:15:27 PM By: Donnamarie Poag Entered By: Kalman Shan on 07/26/2021 13:45:02 [place], Tenna [name] ([id number]) -------------------------------------------------------------------------------- SuperBill Details Patient Name: AEISHA, MINARIK. Date  of Service: 07/26/2021 Medical Record Number: [id number] Patient Account Number: [id number] Date of Birth/Sex: [birth year] (75 y.o. F) Treating RN: Donnamarie Poag Primary Care Provider: [name] Other Clinician: Referring Provider: [name] Treating Provider/Extender: Yaakov Guthrie in Treatment: 15 Diagnosis Coding ICD-10 Codes Code Description [phone] Chronic venous hypertension (idiopathic) with ulcer and inflammation of left lower extremity I89.0 Lymphedema, not elsewhere classified L97.221 Non-pressure chronic ulcer of left calf limited to breakdown of skin Facility Procedures CPT4 Code Description: [id number] - SKIN SUB GRAFT TRNK/ARM/LEG Modifier: Quantity: 1 CPT4 Code Description: ICD-10 Diagnosis Description I87.332 Chronic venous hypertension (idiopathic) with ulcer and inflammation of l I89.0 Lymphedema, not elsewhere classified L97.221 Non-pressure chronic ulcer of left calf limited to breakdown of skin Modifier: eft lower extremity Quantity: Physician Procedures CPT4 Code Description: [id number] - WC PHYS SKIN SUB GRAFT TRNK/ARM/LEG Modifier: Quantity: 1 CPT4 Code Description: ICD-10 Diagnosis Description I87.332 Chronic venous hypertension (idiopathic) with ulcer and inflammation of lef I89.0 Lymphedema, not elsewhere classified L97.221 Non-pressure chronic ulcer of left calf limited to breakdown of  skin Modifier: t lower extremity Quantity: Electronic Signature(s) Signed: 07/26/2021 2:02:17 PM By: Kalman Shan DO Signed: 07/26/2021 3:15:27 PM By: Donnamarie Poag Previous Signature: 07/26/2021 1:49:48 PM Version By: Kalman Shan DO Entered By: Donnamarie Poag on 07/26/2021 13:49:51

## 2021-08-02 ENCOUNTER — Ambulatory Visit (HOSPITAL_COMMUNITY)
Admission: RE | Admit: 2021-08-02 | Discharge: 2021-08-02 | Disposition: A | Discharge status: Home / Self Care | Payer: Medicare Other | Source: Ambulatory Visit | Attending: Vascular Surgery | Admitting: Vascular Surgery

## 2021-08-02 ENCOUNTER — Encounter: Payer: Self-pay | Admitting: Vascular Surgery

## 2021-08-02 ENCOUNTER — Encounter (HOSPITAL_BASED_OUTPATIENT_CLINIC_OR_DEPARTMENT_OTHER): Payer: Medicare Other | Admitting: Internal Medicine

## 2021-08-02 ENCOUNTER — Other Ambulatory Visit: Payer: Self-pay

## 2021-08-02 ENCOUNTER — Ambulatory Visit (INDEPENDENT_AMBULATORY_CARE_PROVIDER_SITE_OTHER): Payer: Medicare Other | Admitting: Vascular Surgery

## 2021-08-02 VITALS — BP 143/82 | HR 77 | Temp 98.2°F | Resp 20 | Ht 63.0 in | Wt 203.0 lb

## 2021-08-02 DIAGNOSIS — L97929 Non-pressure chronic ulcer of unspecified part of left lower leg with unspecified severity: Secondary | ICD-10-CM | POA: Diagnosis not present

## 2021-08-02 DIAGNOSIS — I890 Lymphedema, not elsewhere classified: Secondary | ICD-10-CM

## 2021-08-02 DIAGNOSIS — I871 Compression of vein: Secondary | ICD-10-CM | POA: Diagnosis not present

## 2021-08-02 DIAGNOSIS — I87332 Chronic venous hypertension (idiopathic) with ulcer and inflammation of left lower extremity: Secondary | ICD-10-CM

## 2021-08-02 DIAGNOSIS — R609 Edema, unspecified: Secondary | ICD-10-CM | POA: Diagnosis not present

## 2021-08-02 DIAGNOSIS — I83892 Varicose veins of left lower extremities with other complications: Secondary | ICD-10-CM | POA: Diagnosis not present

## 2021-08-02 DIAGNOSIS — I83029 Varicose veins of left lower extremity with ulcer of unspecified site: Secondary | ICD-10-CM | POA: Diagnosis not present

## 2021-08-02 DIAGNOSIS — L97221 Non-pressure chronic ulcer of left calf limited to breakdown of skin: Secondary | ICD-10-CM

## 2021-08-02 DIAGNOSIS — I872 Venous insufficiency (chronic) (peripheral): Secondary | ICD-10-CM | POA: Diagnosis not present

## 2021-08-02 DIAGNOSIS — L97821 Non-pressure chronic ulcer of other part of left lower leg limited to breakdown of skin: Secondary | ICD-10-CM | POA: Diagnosis not present

## 2021-08-02 NOTE — Progress Notes (Signed)
LINDSEE, LABARRE ([id number]) Visit Report for 08/02/2021 Arrival Information Details Patient Name: [name], [name]. Date of Service: 08/02/2021 2:30 PM Medical Record Number: [id number] Patient Account Number: [id number] Date of Birth/Sex: 1946-10-24 (75 y.o. F) Treating RN: Donnamarie Poag Primary Care Fey Coghill: [name] Other Clinician: Referring Valbona Slabach: [name] Treating Lilyana Lippman/Extender: Yaakov Guthrie in Treatment: 16 Visit Information History Since Last Visit Added or deleted any medications: No Patient Arrived: Ambulatory Had a fall or experienced change in No Arrival Time: 14:38 activities of daily living that may affect Accompanied By: self risk of falls: Transfer Assistance: None Hospitalized since last visit: No Patient Identification Verified: Yes Has Dressing in Place as Prescribed: Yes Secondary Verification Process Completed: Yes Has Compression in Place as Prescribed: Yes Patient Requires Transmission-Based No Pain Present Now: Yes Precautions: Patient Has Alerts: Yes Patient Alerts: Patient on Blood Thinner ASPIRIN NOT diabetic Electronic Signature(s) Signed: 08/02/2021 3:51:14 PM By: Donnamarie Poag Entered By: Donnamarie Poag on 08/02/2021 14:39:50 Carraway, [name] ([id number]) -------------------------------------------------------------------------------- Clinic Level of Care Assessment Details Patient Name: [name], [name]. Date of Service: 08/02/2021 2:30 PM Medical Record Number: [id number] Patient Account Number: [id number] Date of Birth/Sex: 07-20-1946 (75 y.o. F) Treating RN: Donnamarie Poag Primary Care Terri Malerba: [name] Other Clinician: Referring Heran Campau: [name] Treating Elena Cothern/Extender: Yaakov Guthrie in Treatment: 16 Clinic Level of Care Assessment Items TOOL 1 Quantity Score []  - Use when EandM and Procedure is performed on INITIAL visit 0 ASSESSMENTS - Nursing Assessment / Reassessment []  - General  Physical Exam (combine w/ comprehensive assessment (listed just below) when performed on new 0 pt. evals) []  - 0 Comprehensive Assessment (HX, ROS, Risk Assessments, Wounds Hx, etc.) ASSESSMENTS - Wound and Skin Assessment / Reassessment []  - Dermatologic / Skin Assessment (not related to wound area) 0 ASSESSMENTS - Ostomy and/or Continence Assessment and Care []  - Incontinence Assessment and Management 0 []  - 0 Ostomy Care Assessment and Management (repouching, etc.) PROCESS - Coordination of Care []  - Simple Patient / Family Education for ongoing care 0 []  - 0 Complex (extensive) Patient / Family Education for ongoing care []  - 0 Staff obtains Programmer, systems, Records, Test Results / Process Orders []  - 0 Staff telephones HHA, Nursing Homes / Clarify orders / etc []  - 0 Routine Transfer to another Facility (non-emergent condition) []  - 0 Routine Hospital Admission (non-emergent condition) []  - 0 New Admissions / Biomedical engineer / Ordering NPWT, Apligraf, etc. []  - 0 Emergency Hospital Admission (emergent condition) PROCESS - Special Needs []  - Pediatric / Minor Patient Management 0 []  - 0 Isolation Patient Management []  - 0 Hearing / Language / Visual special needs []  - 0 Assessment of Community assistance (transportation, D/C planning, etc.) []  - 0 Additional assistance / Altered mentation []  - 0 Support Surface(s) Assessment (bed, cushion, seat, etc.) INTERVENTIONS - Miscellaneous []  - External ear exam 0 []  - 0 Patient Transfer (multiple staff / Civil Service fast streamer / Similar devices) []  - 0 Simple Staple / Suture removal (25 or less) []  - 0 Complex Staple / Suture removal (26 or more) []  - 0 Hypo/Hyperglycemic Management (do not check if billed separately) []  - 0 Ankle / Brachial Index (ABI) - do not check if billed separately Has the patient been seen at the hospital within the last three years: Yes Total Score: 0 Level Of Care: ____ [name]  ([id number]) Electronic Signature(s) Signed: 08/02/2021 3:51:14 PM By: Donnamarie Poag Entered By: Donnamarie Poag on 08/02/2021 15:21:13 Hibberd, Avanelle J. ([id number]) --------------------------------------------------------------------------------  Compression Therapy Details Patient Name: [name]. Date of Service: 08/02/2021 2:30 PM Medical Record Number: [id number] Patient Account Number: [id number] Date of Birth/Sex: [birth year] (75 y.o. F) Treating RN: Donnamarie Poag Primary Care Denece Shearer: [name] Other Clinician: Referring Jacquette Canales: [name] Treating Malvina Schadler/Extender: Yaakov Guthrie in Treatment: 16 Compression Therapy Performed for Wound Assessment: Wound #13 Left,Distal,Posterior Lower Leg Performed By: Junius Argyle, RN Compression Type: Four Layer Post Procedure Diagnosis Same as Pre-procedure Electronic Signature(s) Signed: 08/02/2021 3:51:14 PM By: Donnamarie Poag Entered By: Donnamarie Poag on 08/02/2021 15:04:00 Cuellar, [name] ([id number]) -------------------------------------------------------------------------------- Encounter Discharge Information Details Patient Name: [name], [name]. Date of Service: 08/02/2021 2:30 PM Medical Record Number: [id number] Patient Account Number: [id number] Date of Birth/Sex: 08/17/1946 (75 y.o. F) Treating RN: Donnamarie Poag Primary Care Otto Felkins: [name] Other Clinician: Referring Annett Boxwell: [name] Treating Kalum Minner/Extender: Yaakov Guthrie in Treatment: 16 Encounter Discharge Information Items Post Procedure Vitals Discharge Condition: Stable Temperature (F): 98.5 Ambulatory Status: Ambulatory Pulse (bpm): 88 Discharge Destination: Home Respiratory Rate (breaths/min): 16 Transportation: Private Auto Blood Pressure (mmHg): 172/91 Accompanied By: self Schedule Follow-up Appointment: Yes Clinical Summary of Care: Electronic Signature(s) Signed: 08/02/2021 3:51:14 PM By: Donnamarie Poag Entered By: Donnamarie Poag on 08/02/2021 15:23:39 Quesnell, [name] ([id number]) -------------------------------------------------------------------------------- Lower Extremity Assessment Details Patient Name: [name], [name]. Date of Service: 08/02/2021 2:30 PM Medical Record Number: [id number] Patient Account Number: [id number] Date of Birth/Sex: 1946-03-12 (75 y.o. F) Treating RN: Donnamarie Poag Primary Care Jenee Spaugh: [name] Other Clinician: Referring Anouk Critzer: [name] Treating Treshon Stannard/Extender: Yaakov Guthrie in Treatment: 16 Edema Assessment Assessed: [name]: Yes] [Right: No] Edema: [Left: N] [Right: o] Calf Left: Right: Point of Measurement: 322 cm From Medial Instep 41 cm Ankle Left: Right: Point of Measurement: 10 cm From Medial Instep 21.5 cm Vascular Assessment Pulses: Dorsalis Pedis Palpable: [Left:Yes] Electronic Signature(s) Signed: 08/02/2021 3:51:14 PM By: Donnamarie Poag Entered By: Donnamarie Poag on 08/02/2021 14:47:05 Babinski, [name] Lenna Sciara ([id number]) -------------------------------------------------------------------------------- Multi Wound Chart Details Patient Name: [name], [name]. Date of Service: 08/02/2021 2:30 PM Medical Record Number: [id number] Patient Account Number: [id number] Date of Birth/Sex: [birth year] (75 y.o. F) Treating RN: Donnamarie Poag Primary Care Mansoor Hillyard: [name] Other Clinician: Referring Ahmiya Abee: [name] Treating Benino Korinek/Extender: Yaakov Guthrie in Treatment: 16 Vital Signs Height(in): 63 Pulse(bpm): 88 Weight(lbs): 212 Blood Pressure(mmHg): 172/91 Body Mass Index(BMI): 38 Temperature(F): 98.5 Respiratory Rate(breaths/min): 16 Photos: [13:No Photos] [N/A:N/A] Wound Location: [13:Left, Distal, Posterior Lower Leg] [N/A:N/A] Wounding Event: [13:Gradually Appeared] [N/A:N/A] Primary Etiology: [13:Venous Leg Ulcer] [N/A:N/A] Comorbid History: [13:Cataracts, Asthma, Sleep Apnea, Deep Vein  Thrombosis, Hypertension, Peripheral Venous Disease, Osteoarthritis, Received Chemotherapy, Received Radiation] [N/A:N/A] Date Acquired: [13:02/24/2021] [N/A:N/A] Weeks of Treatment: [13:16] [N/A:N/A] Wound Status: [13:Open] [N/A:N/A] Measurements L x W x D (cm) [13:0.4x0.2x0.1] [N/A:N/A] Area (cm) : [13:0.063] [N/A:N/A] Volume (cm) : [13:0.006] [N/A:N/A] % Reduction in Area: [13:98.90%] [N/A:N/A] % Reduction in Volume: [13:99.00%] [N/A:N/A] Classification: [13:Full Thickness Without Exposed Support Structures] [N/A:N/A] Exudate Amount: [13:Medium] [N/A:N/A] Exudate Type: [13:Serosanguineous] [N/A:N/A] Exudate Color: [13:red, brown] [N/A:N/A] Granulation Amount: [13:Large (67-100%)] [N/A:N/A] Granulation Quality: [13:Red, Pink] [N/A:N/A] Necrotic Amount: [13:Small (1-33%)] [N/A:N/A] Exposed Structures: [13:Fat Layer (Subcutaneous Tissue): Yes Fascia: No Tendon: No Muscle: No Joint: No Bone: No] [N/A:N/A] Epithelialization: [13:Small (1-33%)] [N/A:N/A] Procedures Performed: [13:Cellular or Tissue Based Product Compression Therapy] [N/A:N/A] Treatment Notes Electronic Signature(s) Signed: 08/02/2021 3:17:28 PM By: Kalman Shan DO Entered By: Kalman Shan on 08/02/2021 15:14:08 Nihiser, [name] ([id number]) -------------------------------------------------------------------------------- Multi-Disciplinary Care Plan Details Patient Name: [name], [name]. Date  of Service: 08/02/2021 2:30 PM Medical Record Number: [id number] Patient Account Number: [id number] Date of Birth/Sex: 06/16/1946 (75 y.o. F) Treating RN: Donnamarie Poag Primary Care Brookelynne Dimperio: [name] Other Clinician: Referring Trever Streater: [name] Treating Aaronmichael Brumbaugh/Extender: Yaakov Guthrie in Treatment: 16 Active Inactive Electronic Signature(s) Signed: 08/02/2021 3:51:14 PM By: Donnamarie Poag Entered By: Donnamarie Poag on 08/02/2021 14:47:16 Merriman, [name]  ([id number]) -------------------------------------------------------------------------------- Pain Assessment Details Patient Name: [name], [name]. Date of Service: 08/02/2021 2:30 PM Medical Record Number: [id number] Patient Account Number: [id number] Date of Birth/Sex: 02/27/1946 (75 y.o. F) Treating RN: Donnamarie Poag Primary Care Demetria Lightsey: [name] Other Clinician: Referring [name] Rineer: [name] Treating Ayani Ospina/Extender: Yaakov Guthrie in Treatment: 16 Active Problems Location of Pain Severity and Description of Pain Patient Has Paino No Site Locations Rate the pain. Current Pain Level: 0 Pain Management and Medication Current Pain Management: Notes not at this time Electronic Signature(s) Signed: 08/02/2021 3:51:14 PM By: Donnamarie Poag Entered By: Donnamarie Poag on 08/02/2021 14:40:45 Basley, [name] ([id number]) -------------------------------------------------------------------------------- Patient/Caregiver Education Details Patient Name: OLGA, BOURBEAU. Date of Service: 08/02/2021 2:30 PM Medical Record Number: [id number] Patient Account Number: [id number] Date of Birth/Gender: 1946-11-05 (75 y.o. F) Treating RN: Donnamarie Poag Primary Care Physician: [name] Other Clinician: Referring Physician: [name] Treating Physician/Extender: Yaakov Guthrie in Treatment: 16 Education Assessment Education Provided To: Patient Education Topics Provided Wound/Skin Impairment: Electronic Signature(s) Signed: 08/02/2021 3:51:14 PM By: Donnamarie Poag Entered By: Donnamarie Poag on 08/02/2021 15:21:39 Stites, [name] ([id number]) -------------------------------------------------------------------------------- Wound Assessment Details Patient Name: [name], [name]. Date of Service: 08/02/2021 2:30 PM Medical Record Number: [id number] Patient Account Number: [id number] Date of Birth/Sex: [birth year] (75 y.o. F) Treating RN: Donnamarie Poag Primary Care  Kline Bulthuis: [name] Other Clinician: Referring Shayden Gingrich: [name] Treating Marquet Faircloth/Extender: Yaakov Guthrie in Treatment: 16 Wound Status Wound Number: 13 Primary Venous Leg Ulcer Etiology: Wound Location: Left, Distal, Posterior Lower Leg Wound Open Wounding Event: Gradually Appeared Status: Date Acquired: 02/24/2021 Comorbid Cataracts, Asthma, Sleep Apnea, Deep Vein Thrombosis, Weeks Of Treatment: 16 History: Hypertension, Peripheral Venous Disease, Osteoarthritis, Clustered Wound: No Received Chemotherapy, Received Radiation Wound Measurements Length: (cm) 0.4 Width: (cm) 0.2 Depth: (cm) 0.1 Area: (cm) 0.063 Volume: (cm) 0.006 % Reduction in Area: 98.9% % Reduction in Volume: 99% Epithelialization: Small (1-33%) Wound Description Classification: Full Thickness Without Exposed Support Structures Exudate Amount: Medium Exudate Type: Serosanguineous Exudate Color: red, brown Foul Odor After Cleansing: No Slough/Fibrino Yes Wound Bed Granulation Amount: Large (67-100%) Exposed Structure Granulation Quality: Red, Pink Fascia Exposed: No Necrotic Amount: Small (1-33%) Fat Layer (Subcutaneous Tissue) Exposed: Yes Necrotic Quality: Adherent Slough Tendon Exposed: No Muscle Exposed: No Joint Exposed: No Bone Exposed: No Treatment Notes Wound #13 (Lower Leg) Wound Laterality: Left, Posterior, Distal Cleanser Soap and Water Discharge Instruction: Gently cleanse wound with antibacterial soap, rinse and pat dry prior to dressing wounds Peri-Wound Care Topical Primary Dressing Secondary Dressing ABD Pad 5x9 (in/in) Discharge Instruction: Cover with ABD pad Secured With Compression Wrap Medichoice 4 layer Compression System, 35-40 mmHG Discharge Instruction: Apply multi-layer wrap as directed. Compression Stockings BUNNIE, REHBERG ([id number]) Add-Ons Electronic Signature(s) Signed: 08/02/2021 3:51:14 PM By: Donnamarie Poag Entered By: Donnamarie Poag on 08/02/2021 15:05:33 YETZALI, WELD ([id number]) -------------------------------------------------------------------------------- Vitals Details Patient Name: [name], LINDSTROM. Date of Service: 08/02/2021 2:30 PM Medical Record Number: [id number] Patient Account Number: [id number] Date of Birth/Sex: 30-Mar-1946 (75 y.o. F) Treating RN: Donnamarie Poag Primary Care Jaslene Marsteller: [name] Other Clinician: Referring Simara Rhyner: [name] Treating  Inaara Tye/Extender: Yaakov Guthrie in Treatment: 16 Vital Signs Time Taken: 14:39 Temperature (F): 98.5 Height (in): 63 Pulse (bpm): 88 Weight (lbs): 212 Respiratory Rate (breaths/min): 16 Body Mass Index (BMI): 37.6 Blood Pressure (mmHg): 172/91 Reference Range: 80 - 120 mg / dl Electronic Signature(s) Signed: 08/02/2021 3:51:14 PM By: Donnamarie Poag Entered ByDonnamarie Poag on 08/02/2021 14:40:30

## 2021-08-02 NOTE — Progress Notes (Signed)
EDYNN, [name] ([id number]) Visit Report for 08/02/2021 Chief Complaint Document Details Patient Name: [name], [name]. Date of Service: 08/02/2021 2:30 PM Medical Record Number: [id number] Patient Account Number: [id number] Date of Birth/Sex: [birth year] (75 y.o. F) Treating RN: Donnamarie Poag Primary Care Provider: [name] Other Clinician: Referring Provider: [name] Treating Provider/Extender: Yaakov Guthrie in Treatment: 16 Information Obtained from: Patient Chief Complaint Left calf venous stasis ulceration. 11/13/16; the patient is here for a left calf recurrent ulceration which is painful and has been present for the last month 01/22/17; the patient re-presents here for recurrent difficulties with blistering and leaking fluid on her left posterior calf 12/10/18; the patient returns to clinic for a wound on the left medial calf 04/12/2021; left lower extremity wound, 05/31/21; right leg wound Electronic Signature(s) Signed: 08/02/2021 3:17:28 PM By: Kalman Shan DO Entered By: Kalman Shan on 08/02/2021 15:14:20 Parmelee, [name] ([id number]) -------------------------------------------------------------------------------- Cellular or Tissue Based Product Details Patient Name: Amber, [name]. Date of Service: 08/02/2021 2:30 PM Medical Record Number: [id number] Patient Account Number: [id number] Date of Birth/Sex: 07/30/1946 (75 y.o. F) Treating RN: Donnamarie Poag Primary Care Provider: [name] Other Clinician: Referring Provider: [name] Treating Provider/Extender: Yaakov Guthrie in Treatment: 16 Cellular or Tissue Based Product Type Wound #13 Left,Distal,Posterior Lower Leg Applied to: Performed By: Physician Kalman Shan, MD Cellular or Tissue Based Product Purap[place] AM Type: Level of Consciousness (Pre- Awake and Alert procedure): Pre-procedure Verification/Time Out Yes - 15:05 Taken: Location: trunk / arms / legs Wound  Size (sq cm): 0.08 Product Size (sq cm): 4 Waste Size (sq cm): 2 Waste Reason: wound size Amount of Product Applied (sq cm): 2 Instrument Used: Forceps, Scissors Lot #: [id number] Expiration Date: 11/03/2023 Fenestrated: Yes Instrument: Blade Reconstituted: Yes Solution Type: normal saline Solution Amount: 3 ml Lot #: [id number] Solution Expiration Date: 04/06/2023 Secured: Yes Secured With: Steri-Strips Dressing Applied: Yes Primary Dressing: mepitel Response to Treatment: Procedure was tolerated well Level of Consciousness (Post- Awake and Alert procedure): Post Procedure Diagnosis Same as Pre-procedure Electronic Signature(s) Signed: 08/02/2021 3:51:14 PM By: Donnamarie Poag Entered By: Donnamarie Poag on 08/02/2021 15:09:41 Blasius, [name] ([id number]) -------------------------------------------------------------------------------- HPI Details Patient Name: Amber, [name]. Date of Service: 08/02/2021 2:30 PM Medical Record Number: [id number] Patient Account Number: [id number] Date of Birth/Sex: [birth year] (75 y.o. F) Treating RN: Donnamarie Poag Primary Care Provider: [name] Other Clinician: Referring Provider: [name] Treating Provider/Extender: Yaakov Guthrie in Treatment: 16 History of Present Illness HPI Description: Pleasant 75 year old with history of chronic venous insufficiency. No diabetes or peripheral vascular disease. Left ABI 1.29. Questionable history of left lower extremity DVT. She developed a recurrent ulceration on her left lateral calf in December 2015, which she attributes to poor diet and subsequent lower extremity edema. She underwent endovenous laser ablation of her left greater saphenous vein in 2010. She underwent laser ablation of accessory branch of left GSV in April 2016 by Dr. Kellie Simmering at [facility]. She was previously wearing Unna boots, which she tolerated well. Tolerating 2 layer compression and cadexomer iodine. She returns to  clinic for follow-up and is without new complaints. She denies any significant pain at this time. She reports persistent pain with pressure. No claudication or ischemic rest pain. No fever or chills. No drainage. READMISSION 11/13/16; this is a 75 year old woman who is not a diabetic. She is here for a review of a painful area on her left medial lower extremity. I note that she was seen here previously last  year for wound I believe to be in the same area. At that time she had undergone previously a left greater saphenous vein ablation by Dr. Kellie Simmering and she had a ablation of the anterior accessory branch of the left greater saphenous vein in March 2016. Seeing that the wound actually closed over. In reviewing the history with her today the ulcer in this area has been recurrent. She describes a biopsy of this area in 2009 that only showed stasis physiology. She also has a history of today malignant melanoma in the right shoulder for which she follows with Dr. 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 Do not see that she has ever had arterial studies in the left leg. She tells me she has a follow-up with Dr. Kellie Simmering in roughly 10 days In any case she developed the reopening of this area roughly a month ago. On the background of this she describes rapidly increasing edema which has responded to Lasix 40 mg and metolazone 2.5 mg as well as the patient's lymph massage. She has been told she has both venous insufficiency and lymphedema but she cannot tolerate compression stockings 11/28/16; the patient saw Dr. Kellie Simmering recently. Per the patient he did arterial Dopplers in the office that did not show evidence of arterial insufficiency, per the patient he stated "treat this like an ordinary venous ulcer". She also saw her dermatologist Dr. Ronnald Ramp who felt that this was more of a vascular ulcer. In  general things are improving although she arrives today with increasing bilateral lower extremity edema with weeping a deeper fluid through the wound on the left medial leg compatible with some degree of lymphedema 12/04/16; the patient's wound is fully epithelialized but I don't think fully healed. We will do another week of depression with Promogran and TCA however I suspect we'll be able to discharge her next week. This is a very unusual-looking wound which was initially a figure-of-eight type wound lying on its side surrounded by petechial like hemorrhage. She has had venous ablation on this side. She apparently does not have an arterial issue per Dr. Kellie Simmering. She saw her dermatologist thought it was "vascular". Patient is definitely going to need ongoing compression and I talked about this with her today she will go to elastic therapy after she leaves here next week 12/11/16; the patient's wound is not completely closed today. She has surrounding scar tissue and in further discussion with the patient it would appear that she had ulcers in this area in 2009 for a prolonged period of time ultimately requiring a punch biopsy of this area that only showed venous insufficiency. I did not previously pickup on this part of the history from the patient. 12/18/16; the patient's wound is completely epithelialized. There is no open area here. She has significant bilateral venous insufficiency with secondary lymphedema to a mild-to-moderate degree she does not have compression stockings.. She did not say anything to me when I was in the room, she told our intake nurse that she was still having pain in this area. This isn't unusual recurrent small open area. She is going to go to elastic therapy to obtain compression stockings. 12/25/16; the patient's wound is fully epithelialized. There is no open area here. The patient describes some continued episodic discomfort in this area medial left calf. However everything  looks fine and healed here. She is been to elastic  therapy and caught herself 15-20 mmHg stockings, they apparently were having trouble getting 20-30 mm stockings in her size 01/22/17; this is a patient we discharged from the clinic a month ago. She has a recurrent open wound on her medial left calf. She had 15 mm support stockings. I told her I thought she needed 20-30 mm compression stockings. She tells me that she has been ill with hospitalization secondary to asthma and is been found to have severe hypokalemia likely secondary to a combination of Lasix and metolazone. This morning she noted blistering and leaking fluid on the posterior part of her left leg. She called our intake nurse urgently and we was saw her this afternoon. She has not had any real discomfort here. I don't know that she's been wearing any stockings on this leg for at least 2-3 days. ABIs in this clinic were 1.21 on the right and 1.3 on the left. She is previously seen vascular surgery who does not think that there is a peripheral arterial issue. 01/30/17; Patient arrives with no open wound on the left leg. She has been to elastic therapy and obtained 20-53mmhg below knee stockings and she has one on the right leg today. READMISSION 02/19/18; this Brockman is a now 75 year old patient we've had in this clinic perhaps 3 times before. I had last looked at her from January 07 December 2016 with an area on the medial left leg. We discharged her on 12/25/16 however she had to be readmitted on 01/22/17 with a recurrence. I have in my notes that we discharged her on 20-30 mm stockings although she tells me she was only wearing support hose because she cannot get stockings on predominantly related to her cervical spine surgery/issues. She has had previous ablations done by vein and vascular in [place] including a great saphenous vein ablation on the left with an anterior accessory branch ablation I think both of these were in 2016. On one  of the previous visit she had a biopsy noted 2009 that was negative. She is not felt to have an arterial issue. She is not a diabetic. She does have a history of obstructive sleep apnea hypertension asthma as well as chronic venous insufficiency and lymphedema. On this occasion she noted 2 dry scaly patch on her left leg. She tried to put lotion on this it didn't really help. There were 2 open areas.the patient has been seeing her primary physician from 02/05/18 through 02/14/18. She had Unna boots applied. The superior wound now on the lateral left leg has closed but she's had one wound that remains open on the lateral left leg. This is not the same spot as we dealt with in 2018. ABIs in this clinic were 1.3 bilaterally DAPHINE, LOCH ([id number]) 02/26/18; patient has a small wound on the left lateral calf. Dimensions are down. She has chronic venous insufficiency and lymphedema. 03/05/18; small open area on the left lateral calf. Dimensions are down. Tightly adherent necrotic debris over the surface of the wound which was difficult to remove. Also the dressing [over collagen] stuck to the wound surface. This was removed with some difficulty as well. Change the primary dressing to Hydrofera Blue ready 03/12/18; small open area on the left lateral calf. Comes in with tightly adherent surface eschar as well as some adherent Hydrofera Blue. 03/19/18; open area on the left lateral calf. Again adherent surface eschar as well as some adherent Hydrofera Blue nonviable subcutaneous tissue. She complained of pain all week even with the  reduction from 4-3 layer compression I put on last week. Also she had an increase in her ankle and calf measurements probably related to the same thing. 03/26/18; open area on the left lateral calf. A very small open area remains here. We used silver alginate starting last week as the Hydrofera Blue seem to stick to the wound bed. In using 4-layer compression 04/02/18; the open area  in the left lateral calf at some adherent slough which I removed there is no open area here. We are able to transition her into her own compression stocking. Truthfully I think this is probably his support hose. However this does not maintain skin integrity will be limited. She cannot put over the toe compression stockings on because of neck problems hand problems etc. She is allergic to the lining layer of juxta lites. We might be forced to use extremitease stocking should this fail READMIT 11/24/2018 Patient is now a 75 year old woman who is not a diabetic. She has been in this clinic on at least 3 previous occasions largely with recurrent wounds on her left leg secondary to chronic venous insufficiency with secondary lymphedema. Her situation is complicated by inability to get stockings on and an allergy to neoprene which is apparently a component and at least juxta lites and other stockings. As a result she really has not been wearing any stockings on her legs. She tells [place] that roughly 2 or 3 weeks ago she started noticing a stinging sensation just above her ankle on the left medial aspect. She has been diagnosed with pseudogout and she wondered whether this was what she was experiencing. She tried to dress this with something she bought at the store however subsequently it pulled skin off and now she has an open wound that is not improving. She has been using Vaseline gauze with a cover bandage. She saw her primary doctor last week who put an Haematologist on her. ABIs in this clinic was 1.03 on the left 2/12; the area is on the left medial ankle. Odd-looking wound with what looks to be surface epithelialization but a multitude of small petechial openings. This clearly not closed yet. We have been using silver alginate under 3 layer compression with TCA 2/19; the wound area did not look quite as good this week. Necrotic debris over the majority of the wound surface which required debridement. She  continues to have a multitude of what looked to be small petechial openings. She reminds [place] that she had a biopsy on this initially during her first outbreak in 2015 in [place] dermatology. She expresses concern about this being a possible melanoma. 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 I assured her that this does not look anything like melanoma. Except for the petechial reaction it does look like a venous insufficiency area and she certainly has evidence of this on both sides 2/26; a difficult area on the left medial ankle. The patient clearly has chronic venous hypertension with some degree of lymphedema. The odd thing about the area is the small petechial hemorrhages. I am not really sure how to explain this. This was present last time and this is not a compression injury. We have been using Hydrofera Blue which I changed to last week 3/4; still using Hydrofera Blue. Aggressive debridement today. She does not have known arterial issues. She has seen Dr. Kellie Simmering at [facility] vein and vascular and and has an ablation  on the left. [Anterior accessory branch of the greater saphenous]. From what I remember they did not feel she had an arterial issue. The patient has had this area biopsied in 2009 at [facility] dermatology and by her recollection they said this was "stasis". She is also follow-up with dermatology locally who thought that this was more of a vascular issue 3/11; using Hydrofera Blue. Aggressive debridement today. She does not have an arterial issue. We are using 3 layer compression although we may need to go to 4. The patient has been in for multiple changes to her wrap since I last saw her a week ago. She says that the area was leaking. I do not have too much more information on what was found 01/19/19 on evaluation today patient was actually being seen for a nurse visit when unfortunately she  had the area on her left lateral lower extremity as well as weeping from the right lower extremity that became apparent. Therefore we did end up actually seeing her for a full visit with myself. She is having some pain at this site as well but fortunately nothing too significant at this point. No fevers, chills, nausea, or vomiting noted at this time. 3/18-Patient is back to the clinic with the left leg venous leg ulcer, the ulcer is larger in size, has a surface that is densely adherent with fibrinous tissue, the Hydrofera Blue was used but is densely adherent and there was difficulty in removing it. The right lower extremity was also wrapped for weeping edema. Patient has a new area over the left lateral foot above the malleolus that is small and appears to have no debris with intact surrounding skin. Patient is on increased dose of Lasix also as a means to edema management 3/25; the patient has a nonhealing venous ulcer on the medial left leg and last week developed a smaller area on the lateral left calf. We have been using Hydrofera Blue with a contact layer. 4/1; no major change in these wounds areas. Left medial and more recently left lateral calf. I tried Iodoflex last week to aid in debridement she did not tolerate this. She stated her pain was terrible all week. She took the top layer of the 4 layer compression off. 4/8; the patient actually looks somewhat better in terms of her more prominent left lateral calf wound. There is some healthy looking tissue here. She is still complaining of a lot of discomfort. 4/15; patient in a lot of pain secondary to sciatica. She is on a prednisone taper prescribed by her primary physician. She has the 2 areas one on the left medial and more recently a smaller area on the left lateral calf. Both of these just above the malleoli 4/22; her back pain is better but she still states she is very uncomfortable and now feels she is intolerant to the [employer]. No  real change in the wounds we have been using Sorbact. She has been previously intolerant to Iodoflex. There is not a lot of option about what we can use to debride this wound under compression that she no doubt needs. sHe states Ultram no longer works for her pain 4/29; no major change in the wounds slightly increased depth. Surface on the original medial wound perhaps somewhat improved however the more recent area on the lateral left ankle is 100% covered in very adherent debris we have been using Sorbact. She tolerates 4 layer compression well and her edema control is a lot better. She has  not had to come in for a nurse check 5/6; no major change in the condition of the wounds. She did consent to debridement today which was done with some difficulty. Continuing Sorbact. She did not tolerate Iodoflex. She was in for a check of her compression the day after we wrapped her last week this was adjusted but nothing much was found 5/13; no major change in the condition or area of the wounds. I was able to get a fairly aggressive debridement done on the lateral left leg wound. Even using Sorbact under compression. She came back in on Friday to have the wrap changed. She says she felt uncomfortable on the lateral aspect of her ankle. She has a long history of chronic venous insufficiency including previous ablation surgery on this side. 5/20-Patient returns for wounds on left leg with both wounds covered in slough, with the lateral leg wound larger in size, she has been in 3 layer compression and felt more comfortable, she describes pain in ankle, in leg and pins and needles in foot, and is about to try Pamelor for this 6/3; wounds on the left lateral and left medial leg. The area medially which is the most recent of the 2 seems to have had the largest increase in [place], MARIAGUADALUPE FIALKOWSKI. ([id number]) dimensions. We have been using Sorbac to try and debride the surface. She has been to see orthopedics they  apparently did a plain x-ray that was indeterminant. Diagnosed her with neuropathy and they have ordered an MRI to determine if there is underlying osteomyelitis. This was not high on my thought list but I suppose it is prudent. We have advised her to make an appointment with vein and vascular in [place]. She has a history of a left greater saphenous and accessory vein ablations I wonder if there is anything else that can be done from a surgical point of view to help in these difficult refractory wounds. We have previously healed this wound on one occasion but it keeps on reopening [medial side] 6/10; deep tissue culture I did last week I think on the left medial wound showed both moderate E. coli and moderate staph aureus [MSSA]. She is going to require antibiotics and I have chosen Augmentin. We have been using Sorbact and we have made better looking wound surface on both sides but certainly no improvement in wound area. She was back in last Friday apparently for a dressing changes the wrap was hurting her outer left ankle. She has not managed to get a hold of vein and vascular in [place]. We are going to have to make her that appointment 6/17; patient is tolerating the Augmentin. She had an MRI that I think was ordered by orthopedic surgeon this did not show osteomyelitis or an abscess did suggest cellulitis. We have been using Sorbact to the lateral and medial ankles. We have been trying to arrange a follow-up appointment with vein and vascular in [place] or did her original ablations. We apparently an area sent the request to vein and vascular in [facility] 6/24; patient has completed the Augmentin. We do not yet have a vein and vascular appointment in [place]. I am not sure what the issue is here we have asked her to call tomorrow. We are using Sorbact. Making some improvements and especially the medial wound. Both surfaces however look better medial and lateral. 7/1; the patient  has been in contact with vein and vascular in [place] but has not yet received an appointment. Using Sorbact we  have gradually improve the wound surface with no improvement in surface area. She is approved for Apligraf but the wound surface still is not completely viable. She has not had to come in for a dressing change 7/8; the patient has an appointment with vein and vascular on 7/31 which is a Friday afternoon. She is concerned about getting back here for [place] to dress her wounds. I think it is important to have them goal for her venous reflux/history of ablations etc. to see if anything else can be done. She apparently tested positive for 1 of the blood tests with regards to lupus and saw a rheumatologist. He has raised the issue of vasculitis again. I have had this thought in the past however the evidence seems overwhelming that this is a venous reflux etiology. If the rheumatologist tells me there is clinical and laboratory investigation is positive for lupus I will rethink this. 7/15; the patient's wound surfaces are quite a bit better. The medial area which was her original wound now has no depth although the lateral wound which was the more recent area actually appears larger. Both with viable surfaces which is indeed better. Using Sorbact. I wanted to use Apligraf on her however there is the issue of the vein and vascular appointment on 7/31 at 2:00 in the afternoon which would not allow her to get back to be rewrapped and they would no doubt remove the graft 7/22; the patient's wound surfaces have moderate amount of debris although generally look better. The lateral one is larger with 2 small satellite areas superiorly. We are waiting for her vein and vascular appointment on 7/31. She has been approved for Apligraf which I would like to use after th 7/29; wound surfaces have improved no debridement is required we have been using Sorbact. She sees vein and vascular on Friday with this  so question of whether anything can be done to lessen the likelihood of recurrence and/or speed the healing of these areas. She is already had previous ablations. She no doubt has severe venous hypertension 8/5-Patient returns at 1 week, she was in [place] for 3 days by her podiatrist, we have been using so backed to the wound, she has increased pain in both the wounds on the left lower leg especially the more distal one on the lateral aspect 8/12-Patient returns at 1 week and she is agreeable to having debridement in both wounds on her left leg today. We have been using Sorbact, and vascular studies were reviewed at last visit 8/19; the patient arrives with her wounds fairly clean and no debridement is required. We have used Sorbact which is really done a nice job in cleaning up these very difficult wound surfaces. The patient saw Dr. Donzetta Matters of vascular surgery on 7/31. He did not feel that there was an arterial component. He felt that her treated greater saphenous vein is adequately addressed and that the small saphenous vein did not appear to be involved significantly. She was also noted to have deep venous reflux which is not treatable. Dr. Donzetta Matters mentioned the possibility of a central obstructive component leading to reflux and he offered her central venography. She wanted to discuss this or think about it. I have urged her to go ahead with this. She has had recurrent difficult wounds in these areas which do heal but after months in the clinic. If there is anything that can be done to reduce the likelihood of this I think it is worth it. 9/2 she  is still working towards getting follow-up with Dr. Donzetta Matters to schedule her CT. Things are quite a bit worse venography. I put Apligraf on 2 weeks ago on both wounds on the medial and lateral part of her left lower leg. She arrives in clinic today with 3 superficial additional wounds above the area laterally and one below the wound medially. She describes a  lot of discomfort. I think these are probably wrapped injuries. Does not look like she has cellulitis. 07/20/2019 on evaluation today patient appears to be doing somewhat poorly in regard to her lower extremity ulcers. She in fact showed signs of erythema in fact we may even be dealing with an infection at this time. Unfortunately I am unsure if this is just infection or if indeed there may be some allergic reaction that occurred as a result of the Apligraf application. With that being said that would be unusual but nonetheless not impossible in this patient is one who is unfortunately allergic to quite a bit. Currently we have been using the Sorbact which seems to do as well as anything for her. I do think we may want to obtain a culture today to see if there is anything showing up there that may need to be addressed. 9/16; noted that last week the wounds look worse in 1 week follow-up of the Apligraf. Using Sorbact as of 2 days ago. She arrives with copious amounts of drainage and new skin breakdown on the back of the left calf. The wounds arm more substantial bilaterally. There is a fair amount of swelling in the left calf no overt DVT there is edema present I think in the left greater than right thigh. She is supposed to go on 9/28 for CT venography. The wounds on the medial and lateral calf are worse and she has new skin breakdown posteriorly at least new for me. This is almost developing into a circumferential wound area The Apligraf was taken off last week which I agree with things are not going in the right direction a culture was done we do not have that back yet. She is on Augmentin that she started 2 days ago 9/23; dressing was changed by her nurses on Monday. In general there is no improvement in the wound areas although the area looks less angry than last week. She did get Augmentin for MSSA cultured on the 14th. She still appears to have too much swelling in the left leg even with 3  layer compression 9/30; the patient underwent her procedure on 9/28 by Dr. Donzetta Matters at [facility]. She was discovered to have the common iliac vein measuring 12.2 mm but at the level of L4-L5 measured 3 mm. After stenting it measured 10 mm. 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 Rouleaux flow in the common femoral and femoral vein was observed much improved after stenting. We are using silver alginate to the wounds on the medial and lateral ankle on the left. 4 layer compression 10/7; the patient had fluid swelling around her knee and 4 layer compression. At the advice of vein and vascular this was reduced to 3 layer which she is tolerating better. We have been using silver alginate under 3 layer compression since last Friday 10/14; arrives with the areas on the left ankle looking a lot better. Inflammation in the area also a lot better. She came in for a nurse check on 10/9 10/21; continued nice improvement. Slight improvements in surface area of both the  medial and lateral wounds on the left. A lot of the satellite lesions in the weeping erythema around these from stasis dermatitis is resolved. We have been using silver alginate DANY, HARTEN ([id number]) 10/28; general improvement in the entire wound areas although not a lot of change in dimensions the wound certainly looks better. There is a lot less in terms of venous inflammation. Continue silver alginate this week however look towards Hydrofera Blue next week 11/4; very adherent debris on the medial wound left wound is not as bad. We have been using silver alginate. Change to [facility] today 11/11; very adherent debris on both wound areas. She went to vein and vascular last week and follow-up they put in [place] boot on this today. He says the [facility] was adherent. Wound is definitely not as good as last week. Especially on the left there the satellite lesions look more prominent 11/18;  absolutely no better. erythema on lateral aspect with tenderness. 09/30/2019 on evaluation today patient appears to actually be doing better. Dr. Dellia Nims did put her on doxycycline last week which I do believe has helped her at this point. Fortunately there is no signs of active infection at this time. No fevers, chills, nausea, vomiting, or diarrhea. I do believe he may want extend the doxycycline for 7 additional days just to ensure everything does completely cleared up the patient is in agreement with that plan. Otherwise she is going require some sharp debridement today 12/2; patient is completing a 2-week course of doxycycline. I gave her this empirically for inflammation as well as infection when I last saw her 2 weeks ago. All of this seems to be better. She is using silver alginate she has the area on the medial aspect of the larger area laterally and the 2 small satellite regions laterally above the major wound. 12/9; the patient's wound on the left medial and left lateral calf look really quite good. We have been using silver alginate. She saw vein and vascular in follow-up on 10/09/2019. She has had a previous left greater saphenous vein ablation by Dr. Oscar La in 2016. More recently she underwent a left common iliac vein stent by Dr. 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 The swelling is improved and certainly the wounds have improved. The patient shows [place] today area on the right medial calf there is almost no wound but leaking lymphedema. She says she start this started 3 or 4 days ago. She did not traumatize it. It is not painful. She does not wear compression on that side 12/16; the patient continues to do well laterally. Medially still requiring debridement. The area on the right calf did not materialize to anything and is not currently open. We wrapped this last time. She has support stockings for that leg although I am not sure they are going to provide adequate  compression 12/23; the lateral wound looks stable. Medially still requiring debridement for tightly adherent fibrinous debris. We've been using silver alginate. Surface area not any different 12/30; neither wound is any better with regards to surface and the area on the left lateral is larger. I been using silver alginate to the left lateral which look quite good last week and Sorbact to the left medial 11/11/2019. Lateral wound area actually looks better and somewhat smaller. Medial still requires a very aggressive debridement today. We have been using Sorbact on both wound areas 1/13; not much better still adherent debris bilaterally. I been using Sorbact. She has severe  venous hypertension. Probably some degree of dermal fibrosis distally. I wonder whether tighter compression might help and I am going to try that today. We also need to work on the bioburden 1/20; using Sorbact. She has severe venous hypertension status post stent placement for pelvic vein compression. We applied gentamicin last time to see if we could reduce bioburden I had some discussion with her today about the use of pentoxifylline. This is occasionally used in this setting for wounds with refractory venous insufficiency. However this interacts with Plavix. She tells me that she was put on this after stent placement for 3 months. She will call Dr. Claretha Cooper office to discuss 1/27; we are using gentamicin under Sorbact. 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 She has a stent. Wound medially is measuring smaller this week. Laterally measuring slightly larger although she has some satellite lesions superiorly 2/3; gentamicin under Sorbact under 4-layer compression. 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 She has a stent on Plavix. Her wounds are measuring smaller this week. More substantially laterally where there is a satellite lesion superiorly. 2/10; gentamicin under  Sorbac. 4-layer compression. Patient communicated with Dr. Donzetta Matters at vein and vascular in [place]. He is okay with the patient coming off Plavix I will therefore start her on pentoxifylline for a 1 month trial. In general her wounds look better today. I had some concerns about swelling in the left thigh however she measures 61.5 on the right and 63 on the mid thigh which does not suggest there is any difficulty. The patient is not describing any pain. 2/17; gentamicin under Sorbac 4-layer compression. She has been on pentoxifylline for 1 week and complains of loose stool. No nausea she is eating and drinking well 2/24; the patient apparently came in 2 days ago for a nurse visit when her wrap fell down. Both areas look a little worse this week macerated medially and satellite lesions laterally. Change to silver alginate today 3/3; wounds are larger today especially medially. She also has more swelling in her foot lower leg and I even noted some swelling in her posterior thigh which is tender. I wonder about the patency of her stent. Fortuitously she sees Dr. Claretha Cooper group on Friday 3/10; Mrs. Linders was seen by vein and vascular on 3/5. The patient underwent ultrasound. There was no evidence of thrombosis involving the IVC no evidence of thrombosis involving the right common iliac vein there is no evidence of thrombosis involving the right external iliac vein the left external vein is also patent. The right common iliac vein stent appears patent bilateral common femoral veins are compressible and appear patent. I was concerned about the left common iliac stent however it looks like this is functional. She has some edema in the posterior thigh that was tender she still has that this week. I also note they had trouble finding the pulses in her left foot and booked her for an ABI baseline in 4 weeks. She will follow up in 6 months for repeat IVC duplex. The patient stopped the pentoxifylline because of  diarrhea. It does not look like that was being effective in any case. I have advised her to go back on her aspirin 81 mg tablet, vascular it also suggested this 3/17; comes in today with her wound surfaces a lot better. The excoriations from last week considerably better probably secondary to the TCA. We have been using silver alginate 3/24; comes in today with smaller wounds both medially and laterally.  Both required debridement. There are 2 small satellite areas superiorly laterally. She also has a very odd bandlike area in the mid calf almost looking like there was a weakness in the wrap in a localized area. I would write this off as being this however anteriorly she has a small raised ballotable area that is very tender almost reminiscent of an abscess but there was no obvious purulent surface to it. 02/04/20 upon evaluation today patient appears to be doing fairly well in regard to her wounds today. Fortunately there is no signs of active infection at this time. No fevers, chills, nausea, vomiting, or diarrhea. She has been tolerating the dressing changes without complication. Fortunately I feel like she is showing signs of improvement although has been sometime since have seen her. Nonetheless the area of concern that Dr. Dellia Nims had last week where she had possibly an area of the wrap that was we can allow the leg to bulge appears to be doing significantly better today there is no signs of anything worsening. JENEE, SPAUGH ([id number]) 4/7; the patient's wounds on her medial and lateral left leg continue to contract. We have been using a regular alginate. Last week she developed an area on the right medial lower leg which is probably a venous ulcer as well. 4/14; the wounds on her left medial and lateral lower leg continue to contract. Surface eschar. We have been using regular alginate. The area on the right medial lower leg is closed. We have been putting both legs under 4-layer  contraction. The patient went back to see vein and vascular she had arterial studies done which were apparently "quite good" per the patient although I have not read their notes I have never felt she had an arterial issue. 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 This is been longstanding and refractory to exercise, leg elevation and longstanding use of compression wraps in our clinic as well as compression stockings on the times we have been able to get these to heal 4/21; we thought she actually might be close this week however she arrives in clinic with a lot of edema in her upper left calf and into her posterior thigh. This is been an intermittent problem here. She says the wrap fell down but it was replaced with a nurse visit on Monday. We are using calcium alginate to the wounds and the wound sizes there not terribly larger than last week but there is a lot more edema 4/28; again wound edges are smaller on both sides. Her edema is better controlled than last time. She is obtained her compression pumps from [employer] although they have not been to her home to set these up. 5/5; left medial and left lateral both look stable. I am not sure the medial is any smaller. We have been using calcium alginate under 4-layer compression. oShe had an area on the right medial. This was eschared today. We have been wrapping this as well. She does not tolerate external compression stockings due to a history of various contact allergies. She has her compression pumps however the representative from the company is coming on her to show her how to use these tomorrow 5/19; patient with severe chronic venous insufficiency secondary to central venous disease. She had a stent placed in her left common iliac vein. She has done better since but still difficult to control wounds. She comes in today with nothing open on the right leg. Her areas on the left  medial and left lateral are just about closed. We are using calcium alginate under 4-layer compression. She is using her external compression pumps at home She only has 15-20 support stockings. States she cannot get anything tighter than that on. 03/30/20-Patient returns at 1 week, the wounds on the left leg are both slightly bigger, the last week she was on 3 layer compression which started to slide down. She is starting to use her lymphedema pumps although she stated on 1 day her right ankle started to swell up and she have to stop that day. Unfortunately the open area seem to oscillate between improving to the point of healing and then flaring up all to do with effectiveness of compression or lack of due to the left leg topography not keeping the compression wraps from rolling down 6/2 patient comes in with a 15/20 mmHg stocking on the right leg. She tells me that she developed a lot of swelling in her ankles she saw orthopedics she was felt to possibly be having a flare of pseudogout versus some other type of arthritis. She was put on steroids for a respiratory issue so that helps with the inflammation. She has not been using the pumps all week. She thinks the left thigh is more swollen than usual and I would agree with that. She has an appointment with Dr. Donzetta Matters 9 days or so from now 6/9; both wounds on the left medial and left lateral are smaller. We have been using calcium alginate under compression. She does not have an open wound on the right leg she is using a stocking and her compression pumps things are going well. She has an appointment with Dr. Donzetta Matters with regards to her stent in the left common iliac vein 6/16; the wounds on the left medial and left lateral ankle continues to contract. The patient saw Dr. Donzetta Matters and I think he seems satisfied. Ordered follow-up venous reflux studies on both sides in September. Cautioned that she may need thigh-high stockings. She has been using  calcium alginate under compression on the left and her own stocking on the right leg. She tells [place] there are no open wounds on the right 6/23; left lateral is just about closed. Medial required debridement today. We have been using calcium alginate. Extensive discussion about the compression pumps she is only using these on 25 mmHg states she could not take 40 or 30 when the wrap came out to her home to demonstrate these. He said they should not feel tight 6/30; the left lateral wound has a slight amount of eschar. . The area medially is about the same using Hydrofera Blue. 7/7; left lateral wound still has some eschar. I will remove this next week may be closed. The area medially is very small using Hydrofera Blue with improvement. Unfortunately the stockings fell down. Unfortunately the blisters have developed at the edge of where the wrap fell. When this happened she says her legs hurt she did not use her pumps. We are not open Monday for her to come in and change the wraps and she had an appointment yesterday. She also tells me that she is going to have an MRI of her back. She is having pain radiating into her left anterior leg she thinks her from an L5 disc. She saw Dr. Ellene Route of neurosurgery 7/14; the area on the left lateral ankle area is closed. Still a small area medially however it looks better as well. We have been using Hydrofera Blue under 4-layer  compression 7/21; left lateral ankle is still closed however her wound on the medial left calf is actually larger. This is probably because Hydrofera Blue got stuck to the wound. She came in for a nurse change on Friday and will do that again this week I was concerned about the amount of swelling that she had last week however she is using her compression pumps twice a day and the swelling seems well controlled 7/28; remaining wound on the left medial lower leg is smaller. We have been using moistened silver collagen under compression she is  coming back for a nurse visit. For reasons that were not really clear she was just keeping her legs elevated and not using her compression pumps. I have asked her to use the compression pumps. She does not have any wounds on the right leg 06/15/20-Patient returns at 2 weeks, her LLE edema is worse and she developed a blister wound that is new and has bigger posterior calf wound on right, we are using Prisma with pad, 4 layer compression. she has been on lasix 40 mg daily 8/18; patient arrives today with things a lot worse than I remember from a few weeks ago. She was seen last week. Noted that her edema was worse and that she now had a left lateral wound as well as deteriorating edema in the medial and posterior part of the lower leg. She says she is using his or her external compression pumps once a day although I wonder about the compliance. 8/25; weeping area on the right medial lower leg. This had actually gotten a small localized area of her compression stocking wet. oOn the left side there is a large denuded area on the posterior medial lower leg and smaller area on the lateral. This was not the original areas that we dealt with. 9/1 the patient's wound on the left leg include the left lateral and left posterior. Larger superficial wounds weeping. She has very poor edema control. Tender localized edema in the left lower medial ankle/heel probably because of localized wrap issues. She freely admits she is not using the compression pumps. She has been up on her feet a lot. She thinks the hydrofera blue is contributing to the pain she is experiencing.. This is a complaint that I have occasionally heard 9/8; really not much improvement. The patient is still complaining of a lot of pain particularly when she uses compression pumps. I switched her to silver alginate last time because she found the Hydrofera Blue to be irritating. I don't hear much difference in her description with the  silver alginate. 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 She has follow-up with Dr. Donzetta Matters I think for ultrasound next month ELFA, WOOTON ([id number]) 9/15; quite a bit of improvement today. We have less edema and more epithelialization in both of her wound areas on the left medial and left lateral calf. These are not the site of her original wounds in this area. She says she has been using her compression pumps for 30 minutes twice a day, there pain issues that never quite understood. Silver alginate as the primary dressing 9/22; continued improvement. Both areas medially and laterally still have a small open area there is some eschar. She continues to complain of left medial ankle pain. Swelling in the leg is in much better condition. We have been using silver alginate 9/29; continued improvement. Both areas medially  and laterally in the left calf look as though they are close some minor surface eschar but I think this is epithelialized. She comes in today saying she has a ruptured disc at L4-L5 cannot bend over to put on her stockings. 10/6; patient comes in today with no open wounds on either leg. However her edema on the left leg in the upper one third of the lower leg is poorly controlled nonpitting. She says that she could not use the pumps for 2 days and then she has been using the last couple of days. It is not clear to me she has been able to get her stocking on. She has back problems. Mrs. Neary has severe chronic venous insufficiency with secondary lymphedema. Her venous insufficiency is partially centrally mediated and that she is now post stent in the left common iliac vein. [facility] Thurner's syndrome/physiology]. She follows up with them on 10/15. She wears 20/30 below-knee stockings. She is supposed to use compression pumps at home although I think her compliance about with this is been less than 100%. I  have asked her to use these 3 times a day. Finally I think she has lipodermatosclerosis in the left lower leg with an inverted bottle sign. It is been a major problem controlling the edema in the left leg. The right leg we have had wounds on but not as significant a problem is on the left READMISSION 04/12/2021 Mrs. Buege is a 74 year old woman we know well in this clinic. She has severe chronic venous insufficiency. 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 I believe she has had bilateral greater saphenous vein ablation in the past as well. She tells me that this wound opened sometime in March. She had a fall and thinks it was initially abrasion. She developed areas she describes as little blisters on the anterior part of her leg and she saw dermatology and was treated for methicillin staph aureus with several rounds of antibiotics. She has been using support stockings on the left leg and says this is the only thing she can get on. Her compression pump use maybe once a day she says if she did not use one she use the other. She comes in today with incredible swelling in the left leg with a wound on the left posterior calf. She has been using Neosporin to this previously a hydrocolloid. 6/15; patient arrives back for 1 week follow-up.Marland Kitchen Apparently her wrap fell down she did not call us to replace this. He has poor edema control. She only uses her compression pumps once a day 6/29; patient presents for 1 week follow-up. She has tolerated the compression wrap well. We have been using Iodoflex under the wrap. She has no issues or complaints today. 7/6; patient presents for 1 week follow-up. She states that the compression wrap rolled down her leg 4 days ago. She has been trying to keep the area covered but has no dressings at home to use. She denies signs of infection. 7/13; patient presents for 1 week follow-up. She states that her compression wrap rolled down  her right leg and she called our office and had it placed a few days ago. She has been tolerating the current wrap well. She states that the Iodoflex is causing a burning sensation. She denies signs of infection. 7/27; patient presents for 1 week follow-up She has tolerated the compression wrap well with Hydrofera Blue underneath. She has now developed a wound to  her right lower extremity. She reports having a culture done To this area by her dermatologist that she reports is negative. She currently denies signs of infection. 8/30; patient presents for 1 week follow-up. On the left side she has tolerated the compression wrap well with Hydrofera Blue underneath. She reports some discomfort to her right lower extremity with the 3 layer compression. She currently denies signs of infection. 06/14/2021 upon evaluation today patient's wound actually appears to be doing decently well based on what I am seeing currently. She actually has 2 areas 1 on the left distal/posterior lower leg and the other on the right lower leg. Subsequently the measurements are roughly the same may be slightly smaller but in general have not made any trend towards getting worse which is great news. 06/23/2021 upon evaluation today patient appears to be doing pretty well in regard to her wounds. On the right she is having difficulty with sciatic pain and this subsequently has led to her taking the wrap off over the last week her leg is more swollen she is also been on prednisone for 10 days. With that being said I think we may want to just use a compression sock on the left that way she will not have to worry about the compression wrap being in place that she cannot get off. With that being said I do believe as well that the patient is going to need to continue with the wrapping on the left we will also need to do a little bit of sharp debridement today. 8/24; patient presents for 1 week follow-up. She has no issues or complaints  today. She denies signs of infection. 8/31; patient presents for 1 week follow-up. She has no issues or complaints today. She has tolerated the compression wrap well on the left lower extremity. She denies signs of infection. 9/7; patient presents for follow-up. She reports that the compression wrap rolled down slightly on her right lower extremity. She reports tenderness to the wound bed. She denies increased warmth or erythema to the surrounding skin. She overall feels well. 9/14; patient presents for follow-up. She has no issues or complaints today. She denies signs of infection. PuraPly is available and patient would like to start this today. 9/21; patient presents for follow-up. She has no issues or complaints today. She tolerated the first skin substitute placement last week under compression. 9/28; patient presents for 1 week follow-up. She has no issues or complaints today. Electronic Signature(s) Signed: 08/02/2021 3:17:28 PM By: Kalman Shan DO Entered By: Kalman Shan on 08/02/2021 15:15:04 Sandford, [name] ([id number]) -------------------------------------------------------------------------------- Physical Exam Details Patient Name: TREZURE, CRONK. Date of Service: 08/02/2021 2:30 PM Medical Record Number: [id number] Patient Account Number: [id number] Date of Birth/Sex: Oct 10, 1946 (75 y.o. F) Treating RN: Donnamarie Poag Primary Care Provider: [name] Other Clinician: Referring Provider: [name] Treating Provider/Extender: Yaakov Guthrie in Treatment: 16 Constitutional . Cardiovascular . Psychiatric . Notes Left lower extremity: To the distal medial aspect there is a small open wound with granulation tissue present. No signs of infection. Electronic Signature(s) Signed: 08/02/2021 3:17:28 PM By: Kalman Shan DO Entered By: Kalman Shan on 08/02/2021 15:15:53 Deringer, [name]  ([id number]) -------------------------------------------------------------------------------- Physician Orders Details Patient Name: DONELLA, PASCARELLA. Date of Service: 08/02/2021 2:30 PM Medical Record Number: [id number] Patient Account Number: [id number] Date of Birth/Sex: 1946-01-06 (75 y.o. F) Treating RN: Donnamarie Poag Primary Care Provider: [name] Other Clinician: Referring Provider: [name] Treating Provider/Extender: Yaakov Guthrie in Treatment: 16 Verbal /  Phone Orders: No Diagnosis Coding Follow-up Appointments Wound #13 Left,Distal,Posterior Lower Leg o Return Appointment in 1 week. - Puraply #4 on 08/09/21 o Nurse Visit as needed Bathing/ Shower/ Hygiene o May shower with wound dressing protected with water repellent cover or cast protector. o No tub bath. Anesthetic (Use 'Patient Medications' Section for Anesthetic Order Entry) o Lidocaine applied to wound bed Cellular or Tissue Based Products o Cellular or Tissue Based Product applied to wound bed; including contact layer, fixation with steri-strips, dry gauze and cover dressing. (DO NOT REMOVE). - Puraply #3 applied 08/02/21 Edema Control - Lymphedema / Segmental Compressive Device / Other o Optional: One layer of unna paste to top of compression wrap (to act as an anchor). o Patient to wear own compression stockings. Remove compression stockings every night before going to bed and put on every morning when getting up. - right leg o Elevate, Exercise Daily and Avoid Standing for Long Periods of Time. o Elevate legs to the level of the heart and pump ankles as often as possible o Elevate leg(s) parallel to the floor when sitting. o Compression Pump: Use compression pump on left lower extremity for 60 minutes, twice daily. - TWICE DAILY for 1 hour o Compression Pump: Use compression pump on right lower extremity for 60 minutes, twice daily. - TWICE DAILY for 1 hour Wound  Treatment Wound #13 - Lower Leg Wound Laterality: Left, Posterior, Distal Cleanser: Soap and Water 1 x Per Week/30 Days Discharge Instructions: Gently cleanse wound with antibacterial soap, rinse and pat dry prior to dressing wounds Secondary Dressing: ABD Pad 5x9 (in/in) 1 x Per Week/30 Days Discharge Instructions: Cover with ABD pad Compression Wrap: Medichoice 4 layer Compression System, 35-40 mmHG 1 x Per Week/30 Days Discharge Instructions: Apply multi-layer wrap as directed. Electronic Signature(s) Signed: 08/02/2021 3:17:28 PM By: Kalman Shan DO Signed: 08/02/2021 3:51:14 PM By: Donnamarie Poag Entered By: Donnamarie Poag on 08/02/2021 15:11:07 Romanello, [name] ([id number]) -------------------------------------------------------------------------------- Problem List Details Patient Name: MAURIANA, DANN. Date of Service: 08/02/2021 2:30 PM Medical Record Number: [id number] Patient Account Number: [id number] Date of Birth/Sex: September 28, 1946 (75 y.o. F) Treating RN: Donnamarie Poag Primary Care Provider: [name] Other Clinician: Referring Provider: [name] Treating Provider/Extender: Yaakov Guthrie in Treatment: 16 Active Problems ICD-10 Encounter Code Description Active Date MDM Diagnosis I87.332 Chronic venous hypertension (idiopathic) with ulcer and inflammation of 04/12/2021 No Yes left lower extremity I89.0 Lymphedema, not elsewhere classified 04/12/2021 No Yes L97.221 Non-pressure chronic ulcer of left calf limited to breakdown of skin 04/12/2021 No Yes Inactive Problems Resolved Problems ICD-10 Code Description Active Date Resolved Date S81.801A Unspecified open wound, right lower leg, initial encounter 05/31/2021 05/31/2021 Electronic Signature(s) Signed: 08/02/2021 3:17:28 PM By: Kalman Shan DO Entered By: Kalman Shan on 08/02/2021 15:14:02 Dobies, [name]  ([id number]) -------------------------------------------------------------------------------- Progress Note/History and Physical Details Patient Name: JAX, ABDELRAHMAN. Date of Service: 08/02/2021 2:30 PM Medical Record Number: [id number] Patient Account Number: [id number] Date of Birth/Sex: [birth year] (75 y.o. F) Treating RN: Donnamarie Poag Primary Care Provider: [name] Other Clinician: Referring Provider: [name] Treating Provider/Extender: Yaakov Guthrie in Treatment: 16 Subjective Chief Complaint Information obtained from Patient Left calf venous stasis ulceration. 11/13/16; the patient is here for a left calf recurrent ulceration which is painful and has been present for the last month 01/22/17; the patient re-presents here for recurrent difficulties with blistering and leaking fluid on her left posterior calf 12/10/18; the patient returns to clinic for a wound on the left  medial calf 04/12/2021; left lower extremity wound, 05/31/21; right leg wound History of Present Illness (HPI) Pleasant 75 year old with history of chronic venous insufficiency. No diabetes or peripheral vascular disease. Left ABI 1.29. Questionable history of left lower extremity DVT. She developed a recurrent ulceration on her left lateral calf in December 2015, which she attributes to poor diet and subsequent lower extremity edema. She underwent endovenous laser ablation of her left greater saphenous vein in 2010. She underwent laser ablation of accessory branch of left GSV in April 2016 by Dr. Kellie Simmering at [facility]. She was previously wearing Unna boots, which she tolerated well. Tolerating 2 layer compression and cadexomer iodine. She returns to clinic for follow-up and is without new complaints. She denies any significant pain at this time. She reports persistent pain with pressure. No claudication or ischemic rest pain. No fever or chills. No drainage. READMISSION 11/13/16; this is a 75 year old  woman who is not a diabetic. She is here for a review of a painful area on her left medial lower extremity. I note that she was seen here previously last year for wound I believe to be in the same area. At that time she had undergone previously a left greater saphenous vein ablation by Dr. Kellie Simmering and she had a ablation of the anterior accessory branch of the left greater saphenous vein in March 2016. Seeing that the wound actually closed over. In reviewing the history with her today the ulcer in this area has been recurrent. She describes a biopsy of this area in 2009 that only showed stasis physiology. She also has a history of today malignant melanoma in the right shoulder for which she follows with Dr. 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 Do not see that she has ever had arterial studies in the left leg. She tells me she has a follow-up with Dr. Kellie Simmering in roughly 10 days In any case she developed the reopening of this area roughly a month ago. On the background of this she describes rapidly increasing edema which has responded to Lasix 40 mg and metolazone 2.5 mg as well as the patient's lymph massage. She has been told she has both venous insufficiency and lymphedema but she cannot tolerate compression stockings 11/28/16; the patient saw Dr. Kellie Simmering recently. Per the patient he did arterial Dopplers in the office that did not show evidence of arterial insufficiency, per the patient he stated "treat this like an ordinary venous ulcer". She also saw her dermatologist Dr. Ronnald Ramp who felt that this was more of a vascular ulcer. In general things are improving although she arrives today with increasing bilateral lower extremity edema with weeping a deeper fluid through the wound on the left medial leg compatible with some degree of lymphedema 12/04/16; the patient's wound is fully  epithelialized but I don't think fully healed. We will do another week of depression with Promogran and TCA however I suspect we'll be able to discharge her next week. This is a very unusual-looking wound which was initially a figure-of-eight type wound lying on its side surrounded by petechial like hemorrhage. She has had venous ablation on this side. She apparently does not have an arterial issue per Dr. Kellie Simmering. She saw her dermatologist thought it was "vascular". Patient is definitely going to need ongoing compression and I talked about this with her today she will go to elastic therapy after  she leaves here next week 12/11/16; the patient's wound is not completely closed today. She has surrounding scar tissue and in further discussion with the patient it would appear that she had ulcers in this area in 2009 for a prolonged period of time ultimately requiring a punch biopsy of this area that only showed venous insufficiency. I did not previously pickup on this part of the history from the patient. 12/18/16; the patient's wound is completely epithelialized. There is no open area here. She has significant bilateral venous insufficiency with secondary lymphedema to a mild-to-moderate degree she does not have compression stockings.. She did not say anything to me when I was in the room, she told our intake nurse that she was still having pain in this area. This isn't unusual recurrent small open area. She is going to go to elastic therapy to obtain compression stockings. 12/25/16; the patient's wound is fully epithelialized. There is no open area here. The patient describes some continued episodic discomfort in this area medial left calf. However everything looks fine and healed here. She is been to elastic therapy and caught herself 15-20 mmHg stockings, they apparently were having trouble getting 20-30 mm stockings in her size 01/22/17; this is a patient we discharged from the clinic a month ago. She has  a recurrent open wound on her medial left calf. She had 15 mm support stockings. I told her I thought she needed 20-30 mm compression stockings. She tells me that she has been ill with hospitalization secondary to asthma and is been found to have severe hypokalemia likely secondary to a combination of Lasix and metolazone. This morning she noted blistering and leaking fluid on the posterior part of her left leg. She called our intake nurse urgently and we was saw her this afternoon. She has not had any real discomfort here. I don't know that she's been wearing any stockings on this leg for at least 2-3 days. ABIs in this clinic were 1.21 on the right and 1.3 on the left. She is previously seen vascular surgery who does not think that there is a peripheral arterial issue. 01/30/17; Patient arrives with no open wound on the left leg. She has been to elastic therapy and obtained 20-40mmhg below knee stockings and she has one on the right leg today. READMISSION 02/19/18; this Gockley is a now 75 year old patient we've had in this clinic perhaps 3 times before. I had last looked at her from January 07 December 2016 with an area on the medial left leg. We discharged her on 12/25/16 however she had to be readmitted on 01/22/17 with a recurrence. I have in my notes that we discharged her on 20-30 mm stockings although she tells me she was only wearing support hose because she cannot get stockings on predominantly related to her cervical spine surgery/issues. She has had previous ablations done by vein and vascular in [place] including a great saphenous vein ablation on the left with an anterior accessory branch ablation I think both of these were in 2016. On one of the previous visit she had a biopsy noted 2009 that was negative. She is not felt to have an arterial issue. She is not a diabetic. She does have a JALILAH, WILTSIE. ([id number]) history of obstructive sleep apnea hypertension asthma as well as chronic  venous insufficiency and lymphedema. On this occasion she noted 2 dry scaly patch on her left leg. She tried to put lotion on this it didn't really help. There were 2 open  areas.the patient has been seeing her primary physician from 02/05/18 through 02/14/18. She had Unna boots applied. The superior wound now on the lateral left leg has closed but she's had one wound that remains open on the lateral left leg. This is not the same spot as we dealt with in 2018. ABIs in this clinic were 1.3 bilaterally 02/26/18; patient has a small wound on the left lateral calf. Dimensions are down. She has chronic venous insufficiency and lymphedema. 03/05/18; small open area on the left lateral calf. Dimensions are down. Tightly adherent necrotic debris over the surface of the wound which was difficult to remove. Also the dressing [over collagen] stuck to the wound surface. This was removed with some difficulty as well. Change the primary dressing to Hydrofera Blue ready 03/12/18; small open area on the left lateral calf. Comes in with tightly adherent surface eschar as well as some adherent Hydrofera Blue. 03/19/18; open area on the left lateral calf. Again adherent surface eschar as well as some adherent Hydrofera Blue nonviable subcutaneous tissue. She complained of pain all week even with the reduction from 4-3 layer compression I put on last week. Also she had an increase in her ankle and calf measurements probably related to the same thing. 03/26/18; open area on the left lateral calf. A very small open area remains here. We used silver alginate starting last week as the Hydrofera Blue seem to stick to the wound bed. In using 4-layer compression 04/02/18; the open area in the left lateral calf at some adherent slough which I removed there is no open area here. We are able to transition her into her own compression stocking. Truthfully I think this is probably his support hose. However this does not maintain skin  integrity will be limited. She cannot put over the toe compression stockings on because of neck problems hand problems etc. She is allergic to the lining layer of juxta lites. We might be forced to use extremitease stocking should this fail READMIT 11/24/2018 Patient is now a 75 year old woman who is not a diabetic. She has been in this clinic on at least 3 previous occasions largely with recurrent wounds on her left leg secondary to chronic venous insufficiency with secondary lymphedema. Her situation is complicated by inability to get stockings on and an allergy to neoprene which is apparently a component and at least juxta lites and other stockings. As a result she really has not been wearing any stockings on her legs. She tells [place] that roughly 2 or 3 weeks ago she started noticing a stinging sensation just above her ankle on the left medial aspect. She has been diagnosed with pseudogout and she wondered whether this was what she was experiencing. She tried to dress this with something she bought at the store however subsequently it pulled skin off and now she has an open wound that is not improving. She has been using Vaseline gauze with a cover bandage. She saw her primary doctor last week who put an Haematologist on her. ABIs in this clinic was 1.03 on the left 2/12; the area is on the left medial ankle. Odd-looking wound with what looks to be surface epithelialization but a multitude of small petechial openings. This clearly not closed yet. We have been using silver alginate under 3 layer compression with TCA 2/19; the wound area did not look quite as good this week. Necrotic debris over the majority of the wound surface which required debridement. She continues to have a multitude  of what looked to be small petechial openings. She reminds [place] that she had a biopsy on this initially during her first outbreak in 2015 in [place] [facility]. She expresses concern about this being a possible  melanoma. 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 I assured her that this does not look anything like melanoma. Except for the petechial reaction it does look like a venous insufficiency area and she certainly has evidence of this on both sides 2/26; a difficult area on the left medial ankle. The patient clearly has chronic venous hypertension with some degree of lymphedema. The odd thing about the area is the small petechial hemorrhages. I am not really sure how to explain this. This was present last time and this is not a compression injury. We have been using Hydrofera Blue which I changed to last week 3/4; still using Hydrofera Blue. Aggressive debridement today. She does not have known arterial issues. She has seen Dr. Kellie Simmering at [facility] vein [facility] vascular and and has an ablation on the left. [Anterior accessory branch of the greater saphenous]. From what I remember they did not feel she had an arterial issue. The patient has had this area biopsied in 2009 at [facility] dermatology and by her recollection they said this was "stasis". She is also follow-up with dermatology locally who thought that this was more of a vascular issue 3/11; using Hydrofera Blue. Aggressive debridement today. She does not have an arterial issue. We are using 3 layer compression although we may need to go to 4. The patient has been in for multiple changes to her wrap since I last saw her a week ago. She says that the area was leaking. I do not have too much more information on what was found 01/19/19 on evaluation today patient was actually being seen for a nurse visit when unfortunately she had the area on her left lateral lower extremity as well as weeping from the right lower extremity that became apparent. Therefore we did end up actually seeing her for a full visit with myself. She is having some pain at this site as well  but fortunately nothing too significant at this point. No fevers, chills, nausea, or vomiting noted at this time. 3/18-Patient is back to the clinic with the left leg venous leg ulcer, the ulcer is larger in size, has a surface that is densely adherent with fibrinous tissue, the Hydrofera Blue was used but is densely adherent and there was difficulty in removing it. The right lower extremity was also wrapped for weeping edema. Patient has a new area over the left lateral foot above the malleolus that is small and appears to have no debris with intact surrounding skin. Patient is on increased dose of Lasix also as a means to edema management 3/25; the patient has a nonhealing venous ulcer on the medial left leg and last week developed a smaller area on the lateral left calf. We have been using Hydrofera Blue with a contact layer. 4/1; no major change in these wounds areas. Left medial and more recently left lateral calf. I tried Iodoflex last week to aid in debridement she did not tolerate this. She stated her pain was terrible all week. She took the top layer of the 4 layer compression off. 4/8; the patient actually looks somewhat better in terms of her more prominent left lateral calf wound. There is some healthy looking tissue here. She is  still complaining of a lot of discomfort. 4/15; patient in a lot of pain secondary to sciatica. She is on a prednisone taper prescribed by her primary physician. She has the 2 areas one on the left medial and more recently a smaller area on the left lateral calf. Both of these just above the malleoli 4/22; her back pain is better but she still states she is very uncomfortable and now feels she is intolerant to the [employer]. No real change in the wounds we have been using Sorbact. She has been previously intolerant to Iodoflex. There is not a lot of option about what we can use to debride this wound under compression that she no doubt needs. sHe states Ultram no  longer works for her pain 4/29; no major change in the wounds slightly increased depth. Surface on the original medial wound perhaps somewhat improved however the more recent area on the lateral left ankle is 100% covered in very adherent debris we have been using Sorbact. She tolerates 4 layer compression well and her edema control is a lot better. She has not had to come in for a nurse check 5/6; no major change in the condition of the wounds. She did consent to debridement today which was done with some difficulty. Continuing Sorbact. She did not tolerate Iodoflex. She was in for a check of her compression the day after we wrapped her last week this was adjusted but ADHIRA, JAMIL. ([id number]) nothing much was found 5/13; no major change in the condition or area of the wounds. I was able to get a fairly aggressive debridement done on the lateral left leg wound. Even using Sorbact under compression. She came back in on Friday to have the wrap changed. She says she felt uncomfortable on the lateral aspect of her ankle. She has a long history of chronic venous insufficiency including previous ablation surgery on this side. 5/20-Patient returns for wounds on left leg with both wounds covered in slough, with the lateral leg wound larger in size, she has been in 3 layer compression and felt more comfortable, she describes pain in ankle, in leg and pins and needles in foot, and is about to try Pamelor for this 6/3; wounds on the left lateral and left medial leg. The area medially which is the most recent of the 2 seems to have had the largest increase in dimensions. We have been using Sorbac to try and debride the surface. She has been to see orthopedics they apparently did a plain x-ray that was indeterminant. Diagnosed her with neuropathy and they have ordered an MRI to determine if there is underlying osteomyelitis. This was not high on my thought list but I suppose it is prudent. We have advised her  to make an appointment with vein and vascular in [place]. She has a history of a left greater saphenous and accessory vein ablations I wonder if there is anything else that can be done from a surgical point of view to help in these difficult refractory wounds. We have previously healed this wound on one occasion but it keeps on reopening [medial side] 6/10; deep tissue culture I did last week I think on the left medial wound showed both moderate E. coli and moderate staph aureus [MSSA]. She is going to require antibiotics and I have chosen Augmentin. We have been using Sorbact and we have made better looking wound surface on both sides but certainly no improvement in wound area. She was back in last Friday  apparently for a dressing changes the wrap was hurting her outer left ankle. She has not managed to get a hold of vein and vascular in [place]. We are going to have to make her that appointment 6/17; patient is tolerating the Augmentin. She had an MRI that I think was ordered by orthopedic surgeon this did not show osteomyelitis or an abscess did suggest cellulitis. We have been using Sorbact to the lateral and medial ankles. We have been trying to arrange a follow-up appointment with vein and vascular in [place] or did her original ablations. We apparently an area sent the request to vein and vascular in [facility] 6/24; patient has completed the Augmentin. We do not yet have a vein and vascular appointment in [place]. I am not sure what the issue is here we have asked her to call tomorrow. We are using Sorbact. Making some improvements and especially the medial wound. Both surfaces however look better medial and lateral. 7/1; the patient has been in contact with vein and vascular in [place] but has not yet received an appointment. Using Sorbact we have gradually improve the wound surface with no improvement in surface area. She is approved for Apligraf but the wound surface still is  not completely viable. She has not had to come in for a dressing change 7/8; the patient has an appointment with vein and vascular on 7/31 which is a Friday afternoon. She is concerned about getting back here for [place] to dress her wounds. I think it is important to have them goal for her venous reflux/history of ablations etc. to see if anything else can be done. She apparently tested positive for 1 of the blood tests with regards to lupus and saw a rheumatologist. He has raised the issue of vasculitis again. I have had this thought in the past however the evidence seems overwhelming that this is a venous reflux etiology. If the rheumatologist tells me there is clinical and laboratory investigation is positive for lupus I will rethink this. 7/15; the patient's wound surfaces are quite a bit better. The medial area which was her original wound now has no depth although the lateral wound which was the more recent area actually appears larger. Both with viable surfaces which is indeed better. Using Sorbact. I wanted to use Apligraf on her however there is the issue of the vein and vascular appointment on 7/31 at 2:00 in the afternoon which would not allow her to get back to be rewrapped and they would no doubt remove the graft 7/22; the patient's wound surfaces have moderate amount of debris although generally look better. The lateral one is larger with 2 small satellite areas superiorly. We are waiting for her vein and vascular appointment on 7/31. She has been approved for Apligraf which I would like to use after th 7/29; wound surfaces have improved no debridement is required we have been using Sorbact. She sees vein and vascular on Friday with this so question of whether anything can be done to lessen the likelihood of recurrence and/or speed the healing of these areas. She is already had previous ablations. She no doubt has severe venous hypertension 8/5-Patient returns at 1 week, she was in [place] for 3 days by her podiatrist, we have been using so backed to the wound, she has increased pain in both the wounds on the left lower leg especially the more distal one on the lateral aspect 8/12-Patient returns at 1 week and she is agreeable to having debridement  in both wounds on her left leg today. We have been using Sorbact, and vascular studies were reviewed at last visit 8/19; the patient arrives with her wounds fairly clean and no debridement is required. We have used Sorbact which is really done a nice job in cleaning up these very difficult wound surfaces. The patient saw Dr. Donzetta Matters of vascular surgery on 7/31. He did not feel that there was an arterial component. He felt that her treated greater saphenous vein is adequately addressed and that the small saphenous vein did not appear to be involved significantly. She was also noted to have deep venous reflux which is not treatable. Dr. Donzetta Matters mentioned the possibility of a central obstructive component leading to reflux and he offered her central venography. She wanted to discuss this or think about it. I have urged her to go ahead with this. She has had recurrent difficult wounds in these areas which do heal but after months in the clinic. If there is anything that can be done to reduce the likelihood of this I think it is worth it. 9/2 she is still working towards getting follow-up with Dr. Donzetta Matters to schedule her CT. Things are quite a bit worse venography. I put Apligraf on 2 weeks ago on both wounds on the medial and lateral part of her left lower leg. She arrives in clinic today with 3 superficial additional wounds above the area laterally and one below the wound medially. She describes a lot of discomfort. I think these are probably wrapped injuries. Does not look like she has cellulitis. 07/20/2019 on evaluation today patient appears to be doing somewhat poorly in regard to her lower extremity ulcers. She in fact showed signs of erythema  in fact we may even be dealing with an infection at this time. Unfortunately I am unsure if this is just infection or if indeed there may be some allergic reaction that occurred as a result of the Apligraf application. With that being said that would be unusual but nonetheless not impossible in this patient is one who is unfortunately allergic to quite a bit. Currently we have been using the Sorbact which seems to do as well as anything for her. I do think we may want to obtain a culture today to see if there is anything showing up there that may need to be addressed. 9/16; noted that last week the wounds look worse in 1 week follow-up of the Apligraf. Using Sorbact as of 2 days ago. She arrives with copious amounts of drainage and new skin breakdown on the back of the left calf. The wounds arm more substantial bilaterally. There is a fair amount of swelling in the left calf no overt DVT there is edema present I think in the left greater than right thigh. She is supposed to go on 9/28 for CT venography. The wounds on the medial and lateral calf are worse and she has new skin breakdown posteriorly at least new for me. This is almost developing into a circumferential wound area The Apligraf was taken off last week which I agree with things are not going in the right direction a culture was done we do not have that back yet. She is on Augmentin that she started 2 days ago 9/23; dressing was changed by her nurses on Monday. In general there is no improvement in the wound areas although the area looks less angry than last week. She did get Augmentin for MSSA cultured on the 14th. She still appears to  have too much swelling in the left leg even with 3 layer compression 9/30; the patient underwent her procedure on 9/28 by Dr. Donzetta Matters at vascular and vein specialist. She was discovered to have the common iliac vein measuring 12.2 mm but at the level of L4-L5 measured 3 mm. After stenting it measured 10 mm. 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 Rouleaux flow in the common femoral and femoral vein was observed much improved after stenting. We are using silver alginate to the wounds on the medial and lateral ankle on the left. 4 layer compression JENNALYN, CAWLEY ([id number]) 10/7; the patient had fluid swelling around her knee and 4 layer compression. At the advice of vein and vascular this was reduced to 3 layer which she is tolerating better. We have been using silver alginate under 3 layer compression since last Friday 10/14; arrives with the areas on the left ankle looking a lot better. Inflammation in the area also a lot better. She came in for a nurse check on 10/9 10/21; continued nice improvement. Slight improvements in surface area of both the medial and lateral wounds on the left. A lot of the satellite lesions in the weeping erythema around these from stasis dermatitis is resolved. We have been using silver alginate 10/28; general improvement in the entire wound areas although not a lot of change in dimensions the wound certainly looks better. There is a lot less in terms of venous inflammation. Continue silver alginate this week however look towards Hydrofera Blue next week 11/4; very adherent debris on the medial wound left wound is not as bad. We have been using silver alginate. Change to [facility] today 11/11; very adherent debris on both wound areas. She went to vein and vascular last week and follow-up they put in [place] boot on this today. He says the [facility] was adherent. Wound is definitely not as good as last week. Especially on the left there the satellite lesions look more prominent 11/18; absolutely no better. erythema on lateral aspect with tenderness. 09/30/2019 on evaluation today patient appears to actually be doing better. Dr. Dellia Nims did put her on doxycycline last week which I do believe has helped her at this point. Fortunately there is no signs  of active infection at this time. No fevers, chills, nausea, vomiting, or diarrhea. I do believe he may want extend the doxycycline for 7 additional days just to ensure everything does completely cleared up the patient is in agreement with that plan. Otherwise she is going require some sharp debridement today 12/2; patient is completing a 2-week course of doxycycline. I gave her this empirically for inflammation as well as infection when I last saw her 2 weeks ago. All of this seems to be better. She is using silver alginate she has the area on the medial aspect of the larger area laterally and the 2 small satellite regions laterally above the major wound. 12/9; the patient's wound on the left medial and left lateral calf look really quite good. We have been using silver alginate. She saw vein and vascular in follow-up on 10/09/2019. She has had a previous left greater saphenous vein ablation by Dr. Oscar La in 2016. More recently she underwent a left common iliac vein stent by Dr. 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 The swelling is improved and certainly the wounds have improved. The patient shows [place] today area on the right medial calf there is almost no wound but leaking lymphedema.  She says she start this started 3 or 4 days ago. She did not traumatize it. It is not painful. She does not wear compression on that side 12/16; the patient continues to do well laterally. Medially still requiring debridement. The area on the right calf did not materialize to anything and is not currently open. We wrapped this last time. She has support stockings for that leg although I am not sure they are going to provide adequate compression 12/23; the lateral wound looks stable. Medially still requiring debridement for tightly adherent fibrinous debris. We've been using silver alginate. Surface area not any different 12/30; neither wound is any better with regards to surface and the area on the left  lateral is larger. I been using silver alginate to the left lateral which look quite good last week and Sorbact to the left medial 11/11/2019. Lateral wound area actually looks better and somewhat smaller. Medial still requires a very aggressive debridement today. We have been using Sorbact on both wound areas 1/13; not much better still adherent debris bilaterally. I been using Sorbact. She has severe venous hypertension. Probably some degree of dermal fibrosis distally. I wonder whether tighter compression might help and I am going to try that today. We also need to work on the bioburden 1/20; using Sorbact. She has severe venous hypertension status post stent placement for pelvic vein compression. We applied gentamicin last time to see if we could reduce bioburden I had some discussion with her today about the use of pentoxifylline. This is occasionally used in this setting for wounds with refractory venous insufficiency. However this interacts with Plavix. She tells me that she was put on this after stent placement for 3 months. She will call Dr. Claretha Cooper office to discuss 1/27; we are using gentamicin under Sorbact. 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 She has a stent. Wound medially is measuring smaller this week. Laterally measuring slightly larger although she has some satellite lesions superiorly 2/3; gentamicin under Sorbact under 4-layer compression. 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 She has a stent on Plavix. Her wounds are measuring smaller this week. More substantially laterally where there is a satellite lesion superiorly. 2/10; gentamicin under Sorbac. 4-layer compression. Patient communicated with Dr. Donzetta Matters at vein and vascular in [place]. He is okay with the patient coming off Plavix I will therefore start her on pentoxifylline for a 1 month trial. In general her wounds look better today. I had some concerns about  swelling in the left thigh however she measures 61.5 on the right and 63 on the mid thigh which does not suggest there is any difficulty. The patient is not describing any pain. 2/17; gentamicin under Sorbac 4-layer compression. She has been on pentoxifylline for 1 week and complains of loose stool. No nausea she is eating and drinking well 2/24; the patient apparently came in 2 days ago for a nurse visit when her wrap fell down. Both areas look a little worse this week macerated medially and satellite lesions laterally. Change to silver alginate today 3/3; wounds are larger today especially medially. She also has more swelling in her foot lower leg and I even noted some swelling in her posterior thigh which is tender. I wonder about the patency of her stent. Fortuitously she sees Dr. Claretha Cooper group on Friday 3/10; Mrs. Difrancesco was seen by vein and vascular on 3/5. The patient underwent ultrasound. There was no evidence of thrombosis involving the IVC no evidence of thrombosis  involving the right common iliac vein there is no evidence of thrombosis involving the right external iliac vein the left external vein is also patent. The right common iliac vein stent appears patent bilateral common femoral veins are compressible and appear patent. I was concerned about the left common iliac stent however it looks like this is functional. She has some edema in the posterior thigh that was tender she still has that this week. I also note they had trouble finding the pulses in her left foot and booked her for an ABI baseline in 4 weeks. She will follow up in 6 months for repeat IVC duplex. The patient stopped the pentoxifylline because of diarrhea. It does not look like that was being effective in any case. I have advised her to go back on her aspirin 81 mg tablet, vascular it also suggested this 3/17; comes in today with her wound surfaces a lot better. The excoriations from last week considerably better  probably secondary to the TCA. We have been using silver alginate 3/24; comes in today with smaller wounds both medially and laterally. Both required debridement. There are 2 small satellite areas superiorly laterally. She also has a very odd bandlike area in the mid calf almost looking like there was a weakness in the wrap in a localized area. I would write this off as being this however anteriorly she has a small raised ballotable area that is very tender almost reminiscent of an abscess but there was no AKIRA, PERUSSE. ([id number]) obvious purulent surface to it. 02/04/20 upon evaluation today patient appears to be doing fairly well in regard to her wounds today. Fortunately there is no signs of active infection at this time. No fevers, chills, nausea, vomiting, or diarrhea. She has been tolerating the dressing changes without complication. Fortunately I feel like she is showing signs of improvement although has been sometime since have seen her. Nonetheless the area of concern that Dr. Dellia Nims had last week where she had possibly an area of the wrap that was we can allow the leg to bulge appears to be doing significantly better today there is no signs of anything worsening. 4/7; the patient's wounds on her medial and lateral left leg continue to contract. We have been using a regular alginate. Last week she developed an area on the right medial lower leg which is probably a venous ulcer as well. 4/14; the wounds on her left medial and lateral lower leg continue to contract. Surface eschar. We have been using regular alginate. The area on the right medial lower leg is closed. We have been putting both legs under 4-layer contraction. The patient went back to see vein and vascular she had arterial studies done which were apparently "quite good" per the patient although I have not read their notes I have never felt she had an arterial issue. 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 This is been longstanding and refractory to exercise, leg elevation and longstanding use of compression wraps in our clinic as well as compression stockings on the times we have been able to get these to heal 4/21; we thought she actually might be close this week however she arrives in clinic with a lot of edema in her upper left calf and into her posterior thigh. This is been an intermittent problem here. She says the wrap fell down but it was replaced with a nurse visit on Monday. We are using calcium alginate to the wounds and the  wound sizes there not terribly larger than last week but there is a lot more edema 4/28; again wound edges are smaller on both sides. Her edema is better controlled than last time. She is obtained her compression pumps from [employer] although they have not been to her home to set these up. 5/5; left medial and left lateral both look stable. I am not sure the medial is any smaller. We have been using calcium alginate under 4-layer compression. She had an area on the right medial. This was eschared today. We have been wrapping this as well. She does not tolerate external compression stockings due to a history of various contact allergies. She has her compression pumps however the representative from the company is coming on her to show her how to use these tomorrow 5/19; patient with severe chronic venous insufficiency secondary to central venous disease. She had a stent placed in her left common iliac vein. She has done better since but still difficult to control wounds. She comes in today with nothing open on the right leg. Her areas on the left medial and left lateral are just about closed. We are using calcium alginate under 4-layer compression. She is using her external compression pumps at home She only has 15-20 support stockings. States she cannot get anything tighter than that on. 03/30/20-Patient returns at 1 week, the  wounds on the left leg are both slightly bigger, the last week she was on 3 layer compression which started to slide down. She is starting to use her lymphedema pumps although she stated on 1 day her right ankle started to swell up and she have to stop that day. Unfortunately the open area seem to oscillate between improving to the point of healing and then flaring up all to do with effectiveness of compression or lack of due to the left leg topography not keeping the compression wraps from rolling down 6/2 patient comes in with a 15/20 mmHg stocking on the right leg. She tells me that she developed a lot of swelling in her ankles she saw orthopedics she was felt to possibly be having a flare of pseudogout versus some other type of arthritis. She was put on steroids for a respiratory issue so that helps with the inflammation. She has not been using the pumps all week. She thinks the left thigh is more swollen than usual and I would agree with that. She has an appointment with Dr. Donzetta Matters 9 days or so from now 6/9; both wounds on the left medial and left lateral are smaller. We have been using calcium alginate under compression. She does not have an open wound on the right leg she is using a stocking and her compression pumps things are going well. She has an appointment with Dr. Donzetta Matters with regards to her stent in the left common iliac vein 6/16; the wounds on the left medial and left lateral ankle continues to contract. The patient saw Dr. Donzetta Matters and I think he seems satisfied. Ordered follow-up venous reflux studies on both sides in September. Cautioned that she may need thigh-high stockings. She has been using calcium alginate under compression on the left and her own stocking on the right leg. She tells [place] there are no open wounds on the right 6/23; left lateral is just about closed. Medial required debridement today. We have been using calcium alginate. Extensive discussion about the compression pumps  she is only using these on 25 mmHg states she could not take 40 or  30 when the wrap came out to her home to demonstrate these. He said they should not feel tight 6/30; the left lateral wound has a slight amount of eschar. . The area medially is about the same using Hydrofera Blue. 7/7; left lateral wound still has some eschar. I will remove this next week may be closed. The area medially is very small using Hydrofera Blue with improvement. Unfortunately the stockings fell down. Unfortunately the blisters have developed at the edge of where the wrap fell. When this happened she says her legs hurt she did not use her pumps. We are not open Monday for her to come in and change the wraps and she had an appointment yesterday. She also tells me that she is going to have an MRI of her back. She is having pain radiating into her left anterior leg she thinks her from an L5 disc. She saw Dr. Ellene Route of neurosurgery 7/14; the area on the left lateral ankle area is closed. Still a small area medially however it looks better as well. We have been using Hydrofera Blue under 4-layer compression 7/21; left lateral ankle is still closed however her wound on the medial left calf is actually larger. This is probably because Hydrofera Blue got stuck to the wound. She came in for a nurse change on Friday and will do that again this week I was concerned about the amount of swelling that she had last week however she is using her compression pumps twice a day and the swelling seems well controlled 7/28; remaining wound on the left medial lower leg is smaller. We have been using moistened silver collagen under compression she is coming back for a nurse visit. For reasons that were not really clear she was just keeping her legs elevated and not using her compression pumps. I have asked her to use the compression pumps. She does not have any wounds on the right leg 06/15/20-Patient returns at 2 weeks, her LLE edema is worse  and she developed a blister wound that is new and has bigger posterior calf wound on right, we are using Prisma with pad, 4 layer compression. she has been on lasix 40 mg daily 8/18; patient arrives today with things a lot worse than I remember from a few weeks ago. She was seen last week. Noted that her edema was worse and that she now had a left lateral wound as well as deteriorating edema in the medial and posterior part of the lower leg. She says she is using his or her external compression pumps once a day although I wonder about the compliance. 8/25; weeping area on the right medial lower leg. This had actually gotten a small localized area of her compression stocking wet. On the left side there is a large denuded area on the posterior medial lower leg and smaller area on the lateral. This was not the original areas that we dealt with. 9/1 the patient's wound on the left leg include the left lateral and left posterior. Larger superficial wounds weeping. She has very poor edema control. Tender localized edema in the left lower medial ankle/heel probably because of localized wrap issues. She freely admits she is not using Woodrow, Huerfano ([id number]) the compression pumps. She has been up on her feet a lot. She thinks the hydrofera blue is contributing to the pain she is experiencing.. This is a complaint that I have occasionally heard 9/8; really not much improvement. The patient is still complaining of a lot  of pain particularly when she uses compression pumps. I switched her to silver alginate last time because she found the Hydrofera Blue to be irritating. I don't hear much difference in her description with the silver alginate. 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 She has follow-up with Dr. Donzetta Matters I think for ultrasound next month 9/15; quite a bit of improvement today. We have less edema and more  epithelialization in both of her wound areas on the left medial and left lateral calf. These are not the site of her original wounds in this area. She says she has been using her compression pumps for 30 minutes twice a day, there pain issues that never quite understood. Silver alginate as the primary dressing 9/22; continued improvement. Both areas medially and laterally still have a small open area there is some eschar. She continues to complain of left medial ankle pain. Swelling in the leg is in much better condition. We have been using silver alginate 9/29; continued improvement. Both areas medially and laterally in the left calf look as though they are close some minor surface eschar but I think this is epithelialized. She comes in today saying she has a ruptured disc at L4-L5 cannot bend over to put on her stockings. 10/6; patient comes in today with no open wounds on either leg. However her edema on the left leg in the upper one third of the lower leg is poorly controlled nonpitting. She says that she could not use the pumps for 2 days and then she has been using the last couple of days. It is not clear to me she has been able to get her stocking on. She has back problems. Mrs. Vantol has severe chronic venous insufficiency with secondary lymphedema. Her venous insufficiency is partially centrally mediated and that she is now post stent in the left common iliac vein. [facility] Thurner's syndrome/physiology]. She follows up with them on 10/15. She wears 20/30 below-knee stockings. She is supposed to use compression pumps at home although I think her compliance about with this is been less than 100%. I have asked her to use these 3 times a day. Finally I think she has lipodermatosclerosis in the left lower leg with an inverted bottle sign. It is been a major problem controlling the edema in the left leg. The right leg we have had wounds on but not as significant a problem is on the  left READMISSION 04/12/2021 Mrs. Streck is a 75 year old woman we know well in this clinic. She has severe chronic venous insufficiency. 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 I believe she has had bilateral greater saphenous vein ablation in the past as well. She tells me that this wound opened sometime in March. She had a fall and thinks it was initially abrasion. She developed areas she describes as little blisters on the anterior part of her leg and she saw dermatology and was treated for methicillin staph aureus with several rounds of antibiotics. She has been using support stockings on the left leg and says this is the only thing she can get on. Her compression pump use maybe once a day she says if she did not use one she use the other. She comes in today with incredible swelling in the left leg with a wound on the left posterior calf. She has been using Neosporin to this  previously a hydrocolloid. 6/15; patient arrives back for 1 week follow-up.Marland Kitchen Apparently her wrap fell down she did not call us to replace this. He has poor edema control. She only uses her compression pumps once a day 6/29; patient presents for 1 week follow-up. She has tolerated the compression wrap well. We have been using Iodoflex under the wrap. She has no issues or complaints today. 7/6; patient presents for 1 week follow-up. She states that the compression wrap rolled down her leg 4 days ago. She has been trying to keep the area covered but has no dressings at home to use. She denies signs of infection. 7/13; patient presents for 1 week follow-up. She states that her compression wrap rolled down her right leg and she called our office and had it placed a few days ago. She has been tolerating the current wrap well. She states that the Iodoflex is causing a burning sensation. She denies signs of infection. 7/27; patient presents for 1 week follow-up She has tolerated the  compression wrap well with Hydrofera Blue underneath. She has now developed a wound to her right lower extremity. She reports having a culture done To this area by her dermatologist that she reports is negative. She currently denies signs of infection. 8/30; patient presents for 1 week follow-up. On the left side she has tolerated the compression wrap well with Hydrofera Blue underneath. She reports some discomfort to her right lower extremity with the 3 layer compression. She currently denies signs of infection. 06/14/2021 upon evaluation today patient's wound actually appears to be doing decently well based on what I am seeing currently. She actually has 2 areas 1 on the left distal/posterior lower leg and the other on the right lower leg. Subsequently the measurements are roughly the same may be slightly smaller but in general have not made any trend towards getting worse which is great news. 06/23/2021 upon evaluation today patient appears to be doing pretty well in regard to her wounds. On the right she is having difficulty with sciatic pain and this subsequently has led to her taking the wrap off over the last week her leg is more swollen she is also been on prednisone for 10 days. With that being said I think we may want to just use a compression sock on the left that way she will not have to worry about the compression wrap being in place that she cannot get off. With that being said I do believe as well that the patient is going to need to continue with the wrapping on the left we will also need to do a little bit of sharp debridement today. 8/24; patient presents for 1 week follow-up. She has no issues or complaints today. She denies signs of infection. 8/31; patient presents for 1 week follow-up. She has no issues or complaints today. She has tolerated the compression wrap well on the left lower extremity. She denies signs of infection. 9/7; patient presents for follow-up. She reports that  the compression wrap rolled down slightly on her right lower extremity. She reports tenderness to the wound bed. She denies increased warmth or erythema to the surrounding skin. She overall feels well. 9/14; patient presents for follow-up. She has no issues or complaints today. She denies signs of infection. PuraPly is available and patient would like to start this today. 9/21; patient presents for follow-up. She has no issues or complaints today. She tolerated the first skin substitute placement last week under compression. 9/28; patient presents for  1 week follow-up. She has no issues or complaints today. JAMILYA, SARRAZIN ([id number]) Patient History Information obtained from Patient. Family History Cancer - Mother,Paternal Grandparents, Hypertension - Mother,Father, Kidney Disease - Paternal Grandparents, Lung Disease - Paternal Grandparents, No family history of Diabetes, Heart Disease, Hereditary Spherocytosis, Seizures, Stroke, Thyroid Problems, Tuberculosis. Social History Former smoker - quit at age 49 - ended on 04/19/1966, Marital Status - Divorced, Alcohol Use - Never, Drug Use - No History, Caffeine Use - Daily. Medical History Eyes Patient has history of Cataracts Denies history of Glaucoma, Optic Neuritis Ear/Nose/Mouth/Throat Denies history of Chronic sinus problems/congestion, Middle ear problems Hematologic/Lymphatic Denies history of Anemia, Hemophilia, Human Immunodeficiency Virus, Lymphedema, Sickle Cell Disease Respiratory Patient has history of Asthma, Sleep Apnea Denies history of Aspiration, Chronic Obstructive Pulmonary Disease (COPD), Pneumothorax, Tuberculosis Cardiovascular Patient has history of Deep Vein Thrombosis - LLE 2010, Hypertension, Peripheral Venous Disease Denies history of Angina, Arrhythmia, Congestive Heart Failure, Coronary Artery Disease, Hypotension, Myocardial Infarction, Peripheral Arterial Disease, Phlebitis,  Vasculitis Gastrointestinal Denies history of Cirrhosis , Colitis, Crohn s, Hepatitis A, Hepatitis B, Hepatitis C Endocrine Denies history of Type I Diabetes, Type II Diabetes Genitourinary Denies history of End Stage Renal Disease Immunological Denies history of Lupus Erythematosus, Raynaud s, Scleroderma Integumentary (Skin) Denies history of History of Burn, History of pressure wounds Musculoskeletal Patient has history of Osteoarthritis - neck Denies history of Gout, Rheumatoid Arthritis, Osteomyelitis Neurologic Denies history of Dementia, Neuropathy, Quadriplegia, Paraplegia, Seizure Disorder Oncologic Patient has history of Received Chemotherapy - interfeon immunotherapy, Received Radiation Psychiatric Denies history of Anorexia/bulimia, Confinement Anxiety Hospitalization/Surgery History - Melanoma left shoulder. Medical And Surgical History Notes Constitutional Symptoms (General Health) Vein ablation LLE 2010 Vascular Sx ( vein ablationo) LLE 02/2015 Eyes Horners syndrome Genitourinary Kidney stones Neurologic ct scan showed swollen lymph nodes Oncologic Melanoma (R) shoulder 07/2010; (R) axillary lymphnode removal Objective Constitutional Vitals Time Taken: 2:39 PM, Height: 63 in, Weight: 212 lbs, BMI: 37.6, Temperature: 98.5 F, Pulse: 88 bpm, Respiratory Rate: 16 breaths/min, Blood Pressure: 172/91 mmHg. MICHELENA, CULMER ([id number]) General Notes: Left lower extremity: To the distal medial aspect there is a small open wound with granulation tissue present. No signs of infection. Integumentary (Hair, Skin) Wound #13 status is Open. Original cause of wound was Gradually Appeared. The date acquired was: 02/24/2021. The wound has been in treatment 16 weeks. The wound is located on the Left,Distal,Posterior Lower Leg. The wound measures 0.4cm length x 0.2cm width x 0.1cm depth; 0.063cm^2 area and 0.006cm^3 volume. There is Fat Layer (Subcutaneous Tissue) exposed. There  is a medium amount of serosanguineous drainage noted. There is large (67-100%) red, pink granulation within the wound bed. There is a small (1-33%) amount of necrotic tissue within the wound bed including Adherent Slough. Assessment Active Problems ICD-10 Chronic venous hypertension (idiopathic) with ulcer and inflammation of left lower extremity Lymphedema, not elsewhere classified Non-pressure chronic ulcer of left calf limited to breakdown of skin Patient has done well with her second application of PuraPly. The wound appears smaller with granulation tissue present. No signs of infection. I am hopeful that she will be healed in the next week with her third application of PuraPly. This was placed in standard fashion today. Procedures Wound #13 Pre-procedure diagnosis of Wound #13 is a Venous Leg Ulcer located on the Left,Distal,Posterior Lower Leg. A skin graft procedure using a bioengineered skin substitute/cellular or tissue based product was performed by Kalman Shan, MD with the following instrument(s): Forceps and Scissors. Puraply AM was  applied and secured with Steri-Strips. 2 sq cm of product was utilized and 2 sq cm was wasted due to wound size. Post Application, mepitel was applied. A Time Out was conducted at 15:05, prior to the start of the procedure. The procedure was tolerated well. Post procedure Diagnosis Wound #13: Same as Pre-Procedure . Pre-procedure diagnosis of Wound #13 is a Venous Leg Ulcer located on the Left,Distal,Posterior Lower Leg . There was a Four Layer Compression Therapy Procedure by Donnamarie Poag, RN. Post procedure Diagnosis Wound #13: Same as Pre-Procedure Plan Follow-up Appointments: Wound #13 Left,Distal,Posterior Lower Leg: Return Appointment in 1 week. - Puraply #4 on 08/09/21 Nurse Visit as needed Bathing/ Shower/ Hygiene: May shower with wound dressing protected with water repellent cover or cast protector. No tub bath. Anesthetic (Use  'Patient Medications' Section for Anesthetic Order Entry): Lidocaine applied to wound bed Cellular or Tissue Based Products: Cellular or Tissue Based Product applied to wound bed; including contact layer, fixation with steri-strips, dry gauze and cover dressing. (DO NOT REMOVE). - Puraply #3 applied 08/02/21 Edema Control - Lymphedema / Segmental Compressive Device / Other: Optional: One layer of unna paste to top of compression wrap (to act as an anchor). Patient to wear own compression stockings. Remove compression stockings every night before going to bed and put on every morning when getting up. - right leg Elevate, Exercise Daily and Avoid Standing for Long Periods of Time. Elevate legs to the level of the heart and pump ankles as often as possible Elevate leg(s) parallel to the floor when sitting. Compression Pump: Use compression pump on left lower extremity for 60 minutes, twice daily. - TWICE DAILY for 1 hour Compression Pump: Use compression pump on right lower extremity for 60 minutes, twice daily. - TWICE DAILY for 1 hour WOUND #13: - Lower Leg Wound Laterality: Left, Posterior, Distal Schneider, Hallelujah J. ([id number]) Cleanser: Soap and Water 1 x Per Week/30 Days Discharge Instructions: Gently cleanse wound with antibacterial soap, rinse and pat dry prior to dressing wounds Secondary Dressing: ABD Pad 5x9 (in/in) 1 x Per Week/30 Days Discharge Instructions: Cover with ABD pad Compression Wrap: Medichoice 4 layer Compression System, 35-40 mmHG 1 x Per Week/30 Days Discharge Instructions: Apply multi-layer wrap as directed. 1. PuraPly #3 placed in standard fashion 2. Follow-up in 1 week Electronic Signature(s) Signed: 08/02/2021 3:17:28 PM By: Kalman Shan DO Entered By: Kalman Shan on 08/02/2021 15:16:59 Paschen, [name] ([id number]) -------------------------------------------------------------------------------- ROS/PFSH Details Patient Name: HARMONIE, VERRASTRO. Date of  Service: 08/02/2021 2:30 PM Medical Record Number: [id number] Patient Account Number: [id number] Date of Birth/Sex: [birth year] (75 y.o. F) Treating RN: Donnamarie Poag Primary Care Provider: [name] Other Clinician: Referring Provider: [name] Treating Provider/Extender: Yaakov Guthrie in Treatment: 16 Label Progress Note Print Version as History and Physical for this encounter Information Obtained From Patient Constitutional Symptoms (General Health) Medical History: Past Medical History Notes: Vein ablation LLE 2010 Vascular Sx ( vein ablationo) LLE 02/2015 Eyes Medical History: Positive for: Cataracts Negative for: Glaucoma; Optic Neuritis Past Medical History Notes: Horners syndrome Ear/Nose/Mouth/Throat Medical History: Negative for: Chronic sinus problems/congestion; Middle ear problems Hematologic/Lymphatic Medical History: Negative for: Anemia; Hemophilia; Human Immunodeficiency Virus; Lymphedema; Sickle Cell Disease Respiratory Medical History: Positive for: Asthma; Sleep Apnea Negative for: Aspiration; Chronic Obstructive Pulmonary Disease (COPD); Pneumothorax; Tuberculosis Cardiovascular Medical History: Positive for: Deep Vein Thrombosis - LLE 2010; Hypertension; Peripheral Venous Disease Negative for: Angina; Arrhythmia; Congestive Heart Failure; Coronary Artery Disease; Hypotension; Myocardial Infarction; Peripheral Arterial Disease; Phlebitis; Vasculitis  Gastrointestinal Medical History: Negative for: Cirrhosis ; Colitis; Crohnos; Hepatitis A; Hepatitis B; Hepatitis C Endocrine Medical History: Negative for: Type I Diabetes; Type II Diabetes Genitourinary Medical History: Negative for: End Stage Renal Disease Past Medical History Notes: Kidney stones KAEDEN, DEPAZ ([id number]) Immunological Medical History: Negative for: Lupus Erythematosus; Raynaudos; Scleroderma Integumentary (Skin) Medical History: Negative for: History of  Burn; History of pressure wounds Musculoskeletal Medical History: Positive for: Osteoarthritis - neck Negative for: Gout; Rheumatoid Arthritis; Osteomyelitis Neurologic Medical History: Negative for: Dementia; Neuropathy; Quadriplegia; Paraplegia; Seizure Disorder Past Medical History Notes: ct scan showed swollen lymph nodes Oncologic Medical History: Positive for: Received Chemotherapy - interfeon immunotherapy; Received Radiation Past Medical History Notes: Melanoma (R) shoulder 07/2010; (R) axillary lymphnode removal Psychiatric Medical History: Negative for: Anorexia/bulimia; Confinement Anxiety HBO Extended History Items Eyes: Cataracts Immunizations Pneumococcal Vaccine: Received Pneumococcal Vaccination: Yes Received Pneumococcal Vaccination On or After 60th Birthday: No Immunization Notes: tetanus shot w/in the last 5 years per pt Implantable Devices None Hospitalization / Surgery History Type of Hospitalization/Surgery Melanoma left shoulder Family and Social History Cancer: Yes - Mother,Paternal Grandparents; Diabetes: No; Heart Disease: No; Hereditary Spherocytosis: No; Hypertension: Yes - Mother,Father; Kidney Disease: Yes - Paternal Grandparents; Lung Disease: Yes - Paternal Grandparents; Seizures: No; Stroke: No; Thyroid Problems: No; Tuberculosis: No; Former smoker - quit at age 12 - ended on 04/19/1966; Marital Status - Divorced; Alcohol Use: Never; Drug Use: No History; Caffeine Use: Daily; Financial Concerns: No; Food, Clothing or Shelter Needs: No; Support System Lacking: No; Transportation Concerns: No Electronic Signature(s) Signed: 08/02/2021 3:17:28 PM By: Kalman Shan DO Signed: 08/02/2021 3:51:14 PM By: Donnamarie Poag Entered By: Kalman Shan on 08/02/2021 15:15:12 Treasure, [name] ([id number]) -------------------------------------------------------------------------------- SuperBill Details Patient Name: LUCIANNA, OSTLUND. Date of Service:  08/02/2021 Medical Record Number: [id number] Patient Account Number: [id number] Date of Birth/Sex: Dec 30, 1945 (75 y.o. F) Treating RN: Donnamarie Poag Primary Care Provider: [name] Other Clinician: Referring Provider: [name] Treating Provider/Extender: Yaakov Guthrie in Treatment: 16 Diagnosis Coding ICD-10 Codes Code Description [phone] Chronic venous hypertension (idiopathic) with ulcer and inflammation of left lower extremity I89.0 Lymphedema, not elsewhere classified L97.221 Non-pressure chronic ulcer of left calf limited to breakdown of skin Facility Procedures CPT4 Code Description: [id number] - SKIN SUB GRAFT TRNK/ARM/LEG Modifier: Quantity: 1 CPT4 Code Description: ICD-10 Diagnosis Description I87.332 Chronic venous hypertension (idiopathic) with ulcer and inflammation of l I89.0 Lymphedema, not elsewhere classified L97.221 Non-pressure chronic ulcer of left calf limited to breakdown of skin Modifier: eft lower extremity Quantity: Physician Procedures CPT4 Code Description: [id number] - WC PHYS SKIN SUB GRAFT TRNK/ARM/LEG Modifier: Quantity: 1 CPT4 Code Description: ICD-10 Diagnosis Description I87.332 Chronic venous hypertension (idiopathic) with ulcer and inflammation of lef I89.0 Lymphedema, not elsewhere classified L97.221 Non-pressure chronic ulcer of left calf limited to breakdown of  skin Modifier: t lower extremity Quantity: Electronic Signature(s) Signed: 08/02/2021 3:17:28 PM By: Kalman Shan DO Entered By: Kalman Shan on 08/02/2021 15:17:13

## 2021-08-02 NOTE — Progress Notes (Signed)
Patient ID: [name], female   DOB: 1946-04-23, 75 y.o.   MRN: [id number]  Reason for Consult: Follow-up   Referred by [name], MD  Subjective:     HPI:  [name] is a 75 y.o. female history of left greater saphenous and accessory saphenous vein ablation.  I have subsequently treated May Thurner syndrome stent was noted to be patent in July.  She was now in wound care center at [facility] for wounds on bilateral lower extremities.  Right lower extremity has now healed with compression.  Left lower extremity has Unna boot.  She is here today for venous reflux testing on the right where she has not had venous intervention in the past.  States that the left lower extremity wound has nearly healed as well.  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/2016   Chronic venous insufficiency 2016   severe reflux with painful varicosities sees [facility]   Clotting disorder ([place])    due to vein ablation    Complication of anesthesia    difficulty coming out due to sleep apnea per pt   Difficult intubation    PATIENT DENIES    Diverticulosis    per ct scans 09-2016 and 01-2017   DVT (deep venous thrombosis) ([place]) 2011   small, developed after venous ablation   DVT (deep venous thrombosis) ([place])    Family history of adverse reaction to anesthesia    O2 levels drop upon waking    Hearing loss sensory, bilateral 2013   mod-severe high freq sensorineural ([facility])   Hiatal hernia     09-2016 ct scan HP at cancer center    History of kidney stones 1980s   History of radiation therapy 07/19/11-08/25/2011   RIGHT AXILLARY REGION/METASTATIC   Hypertension    Melanoma ([place]) 06/29/10   MALIGNANT MELANOMA R SHOULDER/SUPRASCAPULAR BACK s/p interferon  chemo and XRT   Neuromuscular disorder ([facility])    muscle spasms   OSA (obstructive sleep apnea)    on CPAP   Pneumonia    2001   RLS (restless legs syndrome)    Seasonal allergies    Sleep apnea    wears cpap   Tinnitus    Unspecified vitamin D deficiency 03/18/2013   Varicose veins of left lower extremity    Family History  Problem Relation Age of Onset   Cancer Mother 67       uterine   CAD Father 42       MI   Hypertension Father 56   Alzheimer's disease Father 21   Cancer Cousin        x2, breast   Diabetes Sister    Cancer Paternal Grandmother        melanoma, possibly   Colon cancer Neg Hx    Colon polyps Neg Hx    Rectal cancer Neg Hx    Stomach cancer Neg Hx    Past Surgical History:  Procedure Laterality Date   ABI  2016   WNL   ANTERIOR CERVICAL DECOMP/DISCECTOMY FUSION N/A 08/22/2015   ANTERIOR CERVICAL DECOMPRESSION FUSION C3/4 - interbody graft and anterior plate; Kristeen Miss, MD   ANTERIOR CERVICAL DECOMP/DISCECTOMY FUSION  N/A 07/02/2016   Procedure: Cervical four- five Cervical five- six Cervical six- seven Anterior cervical decompression/diskectomy/fusion;  Surgeon: Kristeen Miss, MD;  Location: [facility];  Service: Neurosurgery;  Laterality: N/A;  C4-5 C5-6 C6-7 Anterior cervical decompression/diskectomy/fusion   BREAST BIOPSY Left 2013   BENIGN   BREAST SURGERY Right 1990   BX    CARDIOVASCULAR STRESS TEST  2010   normal stress test, EF 66%   CATARACT EXTRACTION W/ INTRAOCULAR LENS IMPLANT Left 2019   Dr. Kathrin Penner   COLONOSCOPY  03/2005   benign polyp, int hem, rpt 5 yrs ([place], [place])   COLONOSCOPY WITH PROPOFOL N/A 01/06/2018   diverticulosis, decreased sphincter tone, no f/u needed [name], Kirke Corin, MD)   [place] / EXCISION  2012   RIGHT   DEXA  06/2016   WNL   DILATION AND CURETTAGE OF UTERUS  2010   ENDOVENOUS ABLATION SAPHENOUS VEIN W/ LASER Left 2010   [facility]   ENDOVENOUS ABLATION SAPHENOUS VEIN W/  LASER Left 2016   accessory branch [facility] Kellie Simmering)   ESOPHAGOGASTRODUODENOSCOPY  2006   mod chronic carditis, no H pylori ([place], [place])   INTRAVASCULAR ULTRASOUND/IVUS N/A 08/03/2019   Waynetta Sandy, MD   [place]   left   LOWER EXTREMITY VENOGRAPHY Left 08/03/2019   Waynetta Sandy, MD   LUMBAR LAMINECTOMY  2001   L4/5   MELANOMA EXCISION  2011   Right shoulder, with sentinel lymph node biopsy   PERIPHERAL VASCULAR INTERVENTION Left 08/03/2019   Stent of left common iliac vein with 14 x 60 mm Vici Donzetta Matters, [place] Dom, MD)   [facility] REMOVAL  12/05/2011   Procedure: REMOVAL PORT-A-CATH;  Surgeon: Rolm Bookbinder, MD;  Location: [facility];  Service: General;  Laterality: N/A;  left port removal   PORTACATH PLACEMENT     left subclavian   UPPER GASTROINTESTINAL ENDOSCOPY      Short Social History:  Social History   Tobacco Use   Smoking status: Former    Types: Cigarettes    Quit date: 11/05/1965    Years since quitting: 55.7   Smokeless tobacco: Never   Tobacco comments:    socially as a teen  Substance Use Topics   Alcohol use: No    Alcohol/week: 0.0 standard drinks    Allergies  Allergen Reactions   Sulfa Antibiotics Itching, Swelling and Shortness Of Breath    Facial swelling   Voltaren [Diclofenac Sodium] Shortness Of Breath   Latex Other (See Comments)    Blisters ONLY HAD REACTION TO TAPE  (??? ONLY ADHESIVE ALLERGY)   Tape Other (See Comments)    Caused blisters - must use paper tape - same reaction from NeoPrene   Doxycycline Other (See Comments)    Diaphoresis and dizziness   Other     Neoprene    Current Outpatient Medications  Medication Sig Dispense Refill   acetaminophen (TYLENOL) 650 MG CR tablet Take 2 tablets (1,300 mg total) by mouth 2 (two) times daily as needed for pain.     albuterol (VENTOLIN HFA) 108 (90 Base) MCG/ACT inhaler INHALE 2 PUFFS BY MOUTH EVERY 6 HORUS AS NEEDED FOR  WHEEZING/SHORTNESS OF BREATH 8.5 g 3   aspirin EC 81 MG tablet Take 81 mg by mouth at bedtime. Melanoma prevention     furosemide (LASIX) 40 MG tablet TAKE 1 TABLET (40 MG TOTAL) BY MOUTH 2 (TWO) TIMES DAILY AS NEEDED FOR FLUID. TAKE  SECOND DOSE IF NEEDED FOR LEG SWELLING 60 tablet 2   KLOR-CON M20 20 MEQ tablet TAKE 1 TABLET BY MOUTH TWICE A DAY 60 tablet 5   loratadine (CLARITIN) 10 MG tablet Take 10 mg by mouth daily.     losartan (COZAAR) 25 MG tablet TAKE 2 TABLETS BY MOUTH EVERY DAY 60 tablet 2   meloxicam (MOBIC) 15 MG tablet TAKE 1 TABLET BY MOUTH EVERY DAY AS NEEDED FOR PAIN 30 tablet 1   methocarbamol (ROBAXIN) 500 MG tablet Take 1 tablet (500 mg total) by mouth 3 (three) times daily as needed for muscle spasms (sedation precautions). 20 tablet 0   montelukast (SINGULAIR) 10 MG tablet TAKE 1 TABLET BY MOUTH EVERY DAY 30 tablet 3   Multiple Vitamin (MULTIVITAMIN WITH MINERALS) TABS tablet Take 1 tablet by mouth daily.     oxyCODONE (ROXICODONE) 5 MG immediate release tablet Take 1 tablet (5 mg total) by mouth 3 (three) times daily as needed for severe pain. 15 tablet 0   Polyvinyl Alcohol-Povidone (TEARS PLUS OP) Place 1 drop into both eyes daily as needed (dry eyes/ redness/ burning).      PRESCRIPTION MEDICATION CPAP     Probiotic Product (PROBIOTIC DAILY PO) Take 1 tablet by mouth daily.      triamcinolone (NASACORT) 55 MCG/ACT AERO nasal inhaler Place 2 sprays into the nose daily. In each nostril     triamcinolone cream (KENALOG) 0.1 % Apply 1 application topically 2 (two) times daily. Apply to AA. Max 2 weeks at a time. 80 g 1   UNABLE TO FIND Take by mouth.     Vitamin D, Ergocalciferol, (DRISDOL) 1.25 MG (50000 UNIT) CAPS capsule TAKE 1 CAPSULE BY MOUTH EVERY 7 DAYS 12 capsule 3   No current facility-administered medications for this visit.    Review of Systems  Constitutional:  Constitutional negative. HENT: HENT negative.  Eyes: Eyes negative.  Respiratory: Respiratory  negative.  Cardiovascular: Cardiovascular negative.  GI: Gastrointestinal negative.  Musculoskeletal: Musculoskeletal negative.  Skin: Positive for wound.  Neurological: Neurological negative. Hematologic: Hematologic/lymphatic negative.  Psychiatric: Psychiatric negative.       Objective:  Objective  Vitals:   08/02/21 1041  BP: (!) 143/82  Pulse: 77  Resp: 20  Temp: 98.2 F (36.8 C)  SpO2: 95%     Physical Exam HENT:     Head: Normocephalic.     Nose:     Comments: Wearing a mask Eyes:     Pupils: Pupils are equal, round, and reactive to light.  Cardiovascular:     Rate and Rhythm: Normal rate.  Pulmonary:     Effort: Pulmonary effort is normal.  Abdominal:     General: Abdomen is flat.     Palpations: Abdomen is soft.  Musculoskeletal:     Cervical back: Neck supple.     Right lower leg: No edema.     Comments: Compressive wrap on left lower extremity No wounds on right lower extremity  Skin:    Capillary Refill: Capillary refill takes less than 2 seconds.  Neurological:     General: No focal deficit present.     Mental Status: She is alert.  Psychiatric:        Mood and Affect: Mood normal.        Behavior: Behavior normal.        Thought Content: Thought content normal.    Data: Venous Reflux Times  +--------------+---------+------+-----------+------------+--------+  RIGHT         Reflux  NoRefluxReflux TimeDiameter cmsComments                          Yes                                   +--------------+---------+------+-----------+------------+--------+  CFV                     yes   >1 second                       +--------------+---------+------+-----------+------------+--------+  FV mid        no                                              +--------------+---------+------+-----------+------------+--------+  Popliteal     no                                               +--------------+---------+------+-----------+------------+--------+  GSV at SFJ              yes    >500 ms      0.58              +--------------+---------+------+-----------+------------+--------+  GSV prox thigh          yes    >500 ms      0.43              +--------------+---------+------+-----------+------------+--------+  GSV mid thigh           yes    >500 ms      0.40              +--------------+---------+------+-----------+------------+--------+  GSV dist thigh          yes    >500 ms      0.32              +--------------+---------+------+-----------+------------+--------+  GSV at knee             yes    >500 ms      0.36              +--------------+---------+------+-----------+------------+--------+  GSV prox calf           yes    >500 ms      0.40              +--------------+---------+------+-----------+------------+--------+  SSV Pop Fossa no                            0.26              +--------------+---------+------+-----------+------------+--------+  SSV prox calf no                            0.31              +--------------+---------+------+-----------+------------+--------+  SSV mid calf  no                            0.29              +--------------+---------+------+-----------+------------+--------+  Summary:  Right:  - No evidence of deep vein thrombosis seen in the right lower extremity,  from the common femoral through the popliteal veins.  - No evidence of superficial venous thrombosis in the right lower  extremity.     - Venous reflux is noted in the right common femoral vein.  - Venous reflux is noted in the right sapheno-femoral junction.  - Venous reflux is noted in the right greater saphenous vein in the thigh.  - Venous reflux is noted in the right greater saphenous vein in the calf.      Assessment/Plan:    75 year old female with previously noted above procedures.   No veins for intervention on the right lower extremity with previously ablated left greater saphenous and accessory saphenous veins and previous treatment of May Thurner on the left.  Wounds of healed on the right thankfully.  She continues compressive wraps through [facility] center on the left and will continue compression stockings to the right.  She will follow-up in 1 year to evaluate her left common iliac vein stent.     Waynetta Sandy MD [facility]

## 2021-08-03 ENCOUNTER — Telehealth: Payer: Self-pay | Admitting: Family Medicine

## 2021-08-03 NOTE — Telephone Encounter (Signed)
E-scribed refill.  Pt has wellness scheduled on 10/13/21.  Plz schedule schedule cpe and same day labs after 10/13/21.

## 2021-08-04 NOTE — Telephone Encounter (Signed)
Talk to pt tried to schedule a physical/lab but pt want to speak to lisa first

## 2021-08-07 ENCOUNTER — Other Ambulatory Visit: Payer: Self-pay | Admitting: Family Medicine

## 2021-08-08 NOTE — Telephone Encounter (Addendum)
Returned pt's call.  Lvm asking pt to call back.  Need to see what she needs before scheduling labs (@ BS) and cpe (@ GO) after 10/13/21.

## 2021-08-09 ENCOUNTER — Encounter: Payer: Medicare Other | Attending: Internal Medicine | Admitting: Internal Medicine

## 2021-08-09 ENCOUNTER — Other Ambulatory Visit: Payer: Self-pay

## 2021-08-09 DIAGNOSIS — I890 Lymphedema, not elsewhere classified: Secondary | ICD-10-CM | POA: Diagnosis not present

## 2021-08-09 DIAGNOSIS — L97829 Non-pressure chronic ulcer of other part of left lower leg with unspecified severity: Secondary | ICD-10-CM | POA: Diagnosis present

## 2021-08-09 DIAGNOSIS — Z87891 Personal history of nicotine dependence: Secondary | ICD-10-CM | POA: Insufficient documentation

## 2021-08-09 DIAGNOSIS — L97221 Non-pressure chronic ulcer of left calf limited to breakdown of skin: Secondary | ICD-10-CM | POA: Diagnosis not present

## 2021-08-09 DIAGNOSIS — I87332 Chronic venous hypertension (idiopathic) with ulcer and inflammation of left lower extremity: Secondary | ICD-10-CM | POA: Insufficient documentation

## 2021-08-09 NOTE — Progress Notes (Signed)
CHASLYN, EISEN ([id number]) Visit Report for 08/09/2021 Chief Complaint Document Details Patient Name: [name], [name]. Date of Service: 08/09/2021 1:45 PM Medical Record Number: [id number] Patient Account Number: [id number] Date of Birth/Sex: May 28, 1946 (75 y.o. F) Treating RN: Donnamarie Poag Primary Care Provider: [name] Other Clinician: Referring Provider: [name] Treating Provider/Extender: Yaakov Guthrie in Treatment: 17 Information Obtained from: Patient Chief Complaint Left calf venous stasis ulceration. 11/13/16; the patient is here for a left calf recurrent ulceration which is painful and has been present for the last month 01/22/17; the patient re-presents here for recurrent difficulties with blistering and leaking fluid on her left posterior calf 12/10/18; the patient returns to clinic for a wound on the left medial calf 04/12/2021; left lower extremity wound, 05/31/21; right leg wound Electronic Signature(s) Signed: 08/09/2021 2:36:41 PM By: Kalman Shan DO Entered By: Kalman Shan on 08/09/2021 14:30:55 [name], [name] ([id number]) -------------------------------------------------------------------------------- HPI Details Patient Name: [name], [name]. Date of Service: 08/09/2021 1:45 PM Medical Record Number: [id number] Patient Account Number: [id number] Date of Birth/Sex: 08/04/1946 (75 y.o. F) Treating RN: Donnamarie Poag Primary Care Provider: [name] Other Clinician: Referring Provider: [name] Treating Provider/Extender: Yaakov Guthrie in Treatment: 17 History of Present Illness HPI Description: Pleasant 75 year old with history of chronic venous insufficiency. No diabetes or peripheral vascular disease. Left ABI 1.29. Questionable history of left lower extremity DVT. She developed a recurrent ulceration on her left lateral calf in December 2015, which she attributes to poor diet and subsequent lower extremity edema.  She underwent endovenous laser ablation of her left greater saphenous vein in 2010. She underwent laser ablation of accessory branch of left GSV in April 2016 by Dr. Kellie Simmering at [facility]. She was previously wearing Unna boots, which she tolerated well. Tolerating 2 layer compression and cadexomer iodine. She returns to clinic for follow-up and is without new complaints. She denies any significant pain at this time. She reports persistent pain with pressure. No claudication or ischemic rest pain. No fever or chills. No drainage. READMISSION 11/13/16; this is a 75 year old woman who is not a diabetic. She is here for a review of a painful area on her left medial lower extremity. I note that she was seen here previously last year for wound I believe to be in the same area. At that time she had undergone previously a left greater saphenous vein ablation by Dr. Kellie Simmering and she had a ablation of the anterior accessory branch of the left greater saphenous vein in March 2016. Seeing that the wound actually closed over. In reviewing the history with her today the ulcer in this area has been recurrent. She describes a biopsy of this area in 2009 that only showed stasis physiology. She also has a history of today malignant melanoma in the right shoulder for which she follows with Dr. 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 Do not see that she has ever had arterial studies in the left leg. She tells me she has a follow-up with Dr. Kellie Simmering in roughly 10 days In any case she developed the reopening of this area roughly a month ago. On the background of this she describes rapidly increasing edema which has responded to Lasix 40 mg and metolazone 2.5 mg as well as the patient's lymph massage. She has been told she has both venous insufficiency and lymphedema but she cannot tolerate  compression  stockings 11/28/16; the patient saw Dr. Kellie Simmering recently. Per the patient he did arterial Dopplers in the office that did not show evidence of arterial insufficiency, per the patient he stated "treat this like an ordinary venous ulcer". She also saw her dermatologist Dr. Ronnald Ramp who felt that this was more of a vascular ulcer. In general things are improving although she arrives today with increasing bilateral lower extremity edema with weeping a deeper fluid through the wound on the left medial leg compatible with some degree of lymphedema 12/04/16; the patient's wound is fully epithelialized but I don't think fully healed. We will do another week of depression with Promogran and TCA however I suspect we'll be able to discharge her next week. This is a very unusual-looking wound which was initially a figure-of-eight type wound lying on its side surrounded by petechial like hemorrhage. She has had venous ablation on this side. She apparently does not have an arterial issue per Dr. Kellie Simmering. She saw her dermatologist thought it was "vascular". Patient is definitely going to need ongoing compression and I talked about this with her today she will go to elastic therapy after she leaves here next week 12/11/16; the patient's wound is not completely closed today. She has surrounding scar tissue and in further discussion with the patient it would appear that she had ulcers in this area in 2009 for a prolonged period of time ultimately requiring a punch biopsy of this area that only showed venous insufficiency. I did not previously pickup on this part of the history from the patient. 12/18/16; the patient's wound is completely epithelialized. There is no open area here. She has significant bilateral venous insufficiency with secondary lymphedema to a mild-to-moderate degree she does not have compression stockings.. She did not say anything to me when I was in the room, she told our intake nurse that she was still  having pain in this area. This isn't unusual recurrent small open area. She is going to go to elastic therapy to obtain compression stockings. 12/25/16; the patient's wound is fully epithelialized. There is no open area here. The patient describes some continued episodic discomfort in this area medial left calf. However everything looks fine and healed here. She is been to elastic therapy and caught herself 15-20 mmHg stockings, they apparently were having trouble getting 20-30 mm stockings in her size 01/22/17; this is a patient we discharged from the clinic a month ago. She has a recurrent open wound on her medial left calf. She had 15 mm support stockings. I told her I thought she needed 20-30 mm compression stockings. She tells me that she has been ill with hospitalization secondary to asthma and is been found to have severe hypokalemia likely secondary to a combination of Lasix and metolazone. This morning she noted blistering and leaking fluid on the posterior part of her left leg. She called our intake nurse urgently and we was saw her this afternoon. She has not had any real discomfort here. I don't know that she's been wearing any stockings on this leg for at least 2-3 days. ABIs in this clinic were 1.21 on the right and 1.3 on the left. She is previously seen vascular surgery who does not think that there is a peripheral arterial issue. 01/30/17; Patient arrives with no open wound on the left leg. She has been to elastic therapy and obtained 20-39mmhg below knee stockings and she has one on the right leg today. READMISSION 02/19/18; this [name] is a now  75 year old patient we've had in this clinic perhaps 3 times before. I had last looked at her from January 07 December 2016 with an area on the medial left leg. We discharged her on 12/25/16 however she had to be readmitted on 01/22/17 with a recurrence. I have in my notes that we discharged her on 20-30 mm stockings although she tells me she was  only wearing support hose because she cannot get stockings on predominantly related to her cervical spine surgery/issues. She has had previous ablations done by vein and vascular in [place] including a great saphenous vein ablation on the left with an anterior accessory branch ablation I think both of these were in 2016. On one of the previous visit she had a biopsy noted 2009 that was negative. She is not felt to have an arterial issue. She is not a diabetic. She does have a history of obstructive sleep apnea hypertension asthma as well as chronic venous insufficiency and lymphedema. On this occasion she noted 2 dry scaly patch on her left leg. She tried to put lotion on this it didn't really help. There were 2 open areas.the patient has been seeing her primary physician from 02/05/18 through 02/14/18. She had Unna boots applied. The superior wound now on the lateral left leg has closed but she's had one wound that remains open on the lateral left leg. This is not the same spot as we dealt with in 2018. ABIs in this clinic were 1.3 bilaterally [name], [name] ([id number]) 02/26/18; patient has a small wound on the left lateral calf. Dimensions are down. She has chronic venous insufficiency and lymphedema. 03/05/18; small open area on the left lateral calf. Dimensions are down. Tightly adherent necrotic debris over the surface of the wound which was difficult to remove. Also the dressing [over collagen] stuck to the wound surface. This was removed with some difficulty as well. Change the primary dressing to Hydrofera Blue ready 03/12/18; small open area on the left lateral calf. Comes in with tightly adherent surface eschar as well as some adherent Hydrofera Blue. 03/19/18; open area on the left lateral calf. Again adherent surface eschar as well as some adherent Hydrofera Blue nonviable subcutaneous tissue. She complained of pain all week even with the reduction from 4-3 layer compression I put on last  week. Also she had an increase in her ankle and calf measurements probably related to the same thing. 03/26/18; open area on the left lateral calf. A very small open area remains here. We used silver alginate starting last week as the Hydrofera Blue seem to stick to the wound bed. In using 4-layer compression 04/02/18; the open area in the left lateral calf at some adherent slough which I removed there is no open area here. We are able to transition her into her own compression stocking. Truthfully I think this is probably his support hose. However this does not maintain skin integrity will be limited. She cannot put over the toe compression stockings on because of neck problems hand problems etc. She is allergic to the lining layer of juxta lites. We might be forced to use extremitease stocking should this fail READMIT 11/24/2018 Patient is now a 75 year old woman who is not a diabetic. She has been in this clinic on at least 3 previous occasions largely with recurrent wounds on her left leg secondary to chronic venous insufficiency with secondary lymphedema. Her situation is complicated by inability to get stockings on and an allergy to neoprene which is apparently a  component and at least juxta lites and other stockings. As a result she really has not been wearing any stockings on her legs. She tells [place] that roughly 2 or 3 weeks ago she started noticing a stinging sensation just above her ankle on the left medial aspect. She has been diagnosed with pseudogout and she wondered whether this was what she was experiencing. She tried to dress this with something she bought at the store however subsequently it pulled skin off and now she has an open wound that is not improving. She has been using Vaseline gauze with a cover bandage. She saw her primary doctor last week who put an Haematologist on her. ABIs in this clinic was 1.03 on the left 2/12; the area is on the left medial ankle. Odd-looking wound with  what looks to be surface epithelialization but a multitude of small petechial openings. This clearly not closed yet. We have been using silver alginate under 3 layer compression with TCA 2/19; the wound area did not look quite as good this week. Necrotic debris over the majority of the wound surface which required debridement. She continues to have a multitude of what looked to be small petechial openings. She reminds [place] that she had a biopsy on this initially during her first outbreak in 2015 in [place] [facility]. She expresses concern about this being a possible melanoma. 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 I assured her that this does not look anything like melanoma. Except for the petechial reaction it does look like a venous insufficiency area and she certainly has evidence of this on both sides 2/26; a difficult area on the left medial ankle. The patient clearly has chronic venous hypertension with some degree of lymphedema. The odd thing about the area is the small petechial hemorrhages. I am not really sure how to explain this. This was present last time and this is not a compression injury. We have been using Hydrofera Blue which I changed to last week 3/4; still using Hydrofera Blue. Aggressive debridement today. She does not have known arterial issues. She has seen Dr. Kellie Simmering at [name] vein and vascular and and has an ablation on the left. [Anterior accessory branch of the greater saphenous]. From what I remember they did not feel she had an arterial issue. The patient has had this area biopsied in 2009 at [facility] dermatology and by her recollection they said this was "stasis". She is also follow-up with dermatology locally who thought that this was more of a vascular issue 3/11; using Hydrofera Blue. Aggressive debridement today. She does not have an arterial issue. We are using 3  layer compression although we may need to go to 4. The patient has been in for multiple changes to her wrap since I last saw her a week ago. She says that the area was leaking. I do not have too much more information on what was found 01/19/19 on evaluation today patient was actually being seen for a nurse visit when unfortunately she had the area on her left lateral lower extremity as well as weeping from the right lower extremity that became apparent. Therefore we did end up actually seeing her for a full visit with myself. She is having some pain at this site as well but fortunately nothing too significant at this point. No fevers, chills, nausea, or vomiting noted at this time. 3/18-Patient is back to the clinic with  the left leg venous leg ulcer, the ulcer is larger in size, has a surface that is densely adherent with fibrinous tissue, the Hydrofera Blue was used but is densely adherent and there was difficulty in removing it. The right lower extremity was also wrapped for weeping edema. Patient has a new area over the left lateral foot above the malleolus that is small and appears to have no debris with intact surrounding skin. Patient is on increased dose of Lasix also as a means to edema management 3/25; the patient has a nonhealing venous ulcer on the medial left leg and last week developed a smaller area on the lateral left calf. We have been using Hydrofera Blue with a contact layer. 4/1; no major change in these wounds areas. Left medial and more recently left lateral calf. I tried Iodoflex last week to aid in debridement she did not tolerate this. She stated her pain was terrible all week. She took the top layer of the 4 layer compression off. 4/8; the patient actually looks somewhat better in terms of her more prominent left lateral calf wound. There is some healthy looking tissue here. She is still complaining of a lot of discomfort. 4/15; patient in a lot of pain secondary to  sciatica. She is on a prednisone taper prescribed by her primary physician. She has the 2 areas one on the left medial and more recently a smaller area on the left lateral calf. Both of these just above the malleoli 4/22; her back pain is better but she still states she is very uncomfortable and now feels she is intolerant to the [employer]. No real change in the wounds we have been using Sorbact. She has been previously intolerant to Iodoflex. There is not a lot of option about what we can use to debride this wound under compression that she no doubt needs. sHe states Ultram no longer works for her pain 4/29; no major change in the wounds slightly increased depth. Surface on the original medial wound perhaps somewhat improved however the more recent area on the lateral left ankle is 100% covered in very adherent debris we have been using Sorbact. She tolerates 4 layer compression well and her edema control is a lot better. She has not had to come in for a nurse check 5/6; no major change in the condition of the wounds. She did consent to debridement today which was done with some difficulty. Continuing Sorbact. She did not tolerate Iodoflex. She was in for a check of her compression the day after we wrapped her last week this was adjusted but nothing much was found 5/13; no major change in the condition or area of the wounds. I was able to get a fairly aggressive debridement done on the lateral left leg wound. Even using Sorbact under compression. She came back in on Friday to have the wrap changed. She says she felt uncomfortable on the lateral aspect of her ankle. She has a long history of chronic venous insufficiency including previous ablation surgery on this side. 5/20-Patient returns for wounds on left leg with both wounds covered in slough, with the lateral leg wound larger in size, she has been in 3 layer compression and felt more comfortable, she describes pain in ankle, in leg and pins and  needles in foot, and is about to try Pamelor for this 6/3; wounds on the left lateral and left medial leg. The area medially which is the most recent of the 2 seems to have had  the largest increase in [place], [place] ([id number]) dimensions. We have been using Sorbac to try and debride the surface. She has been to see orthopedics they apparently did a plain x-ray that was indeterminant. Diagnosed her with neuropathy and they have ordered an MRI to determine if there is underlying osteomyelitis. This was not high on my thought list but I suppose it is prudent. We have advised her to make an appointment with vein and vascular in [place]. She has a history of a left greater saphenous and accessory vein ablations I wonder if there is anything else that can be done from a surgical point of view to help in these difficult refractory wounds. We have previously healed this wound on one occasion but it keeps on reopening [medial side] 6/10; deep tissue culture I did last week I think on the left medial wound showed both moderate E. coli and moderate staph aureus [MSSA]. She is going to require antibiotics and I have chosen Augmentin. We have been using Sorbact and we have made better looking wound surface on both sides but certainly no improvement in wound area. She was back in last Friday apparently for a dressing changes the wrap was hurting her outer left ankle. She has not managed to get a hold of vein and vascular in [place]. We are going to have to make her that appointment 6/17; patient is tolerating the Augmentin. She had an MRI that I think was ordered by orthopedic surgeon this did not show osteomyelitis or an abscess did suggest cellulitis. We have been using Sorbact to the lateral and medial ankles. We have been trying to arrange a follow-up appointment with vein and vascular in [place] or did her original ablations. We apparently an area sent the request to vein and vascular in  [facility] 6/24; patient has completed the Augmentin. We do not yet have a vein and vascular appointment in [place]. I am not sure what the issue is here we have asked her to call tomorrow. We are using Sorbact. Making some improvements and especially the medial wound. Both surfaces however look better medial and lateral. 7/1; the patient has been in contact with vein and vascular in [place] but has not yet received an appointment. Using Sorbact we have gradually improve the wound surface with no improvement in surface area. She is approved for Apligraf but the wound surface still is not completely viable. She has not had to come in for a dressing change 7/8; the patient has an appointment with vein and vascular on 7/31 which is a Friday afternoon. She is concerned about getting back here for [place] to dress her wounds. I think it is important to have them goal for her venous reflux/history of ablations etc. to see if anything else can be done. She apparently tested positive for 1 of the blood tests with regards to lupus and saw a rheumatologist. He has raised the issue of vasculitis again. I have had this thought in the past however the evidence seems overwhelming that this is a venous reflux etiology. If the rheumatologist tells me there is clinical and laboratory investigation is positive for lupus I will rethink this. 7/15; the patient's wound surfaces are quite a bit better. The medial area which was her original wound now has no depth although the lateral wound which was the more recent area actually appears larger. Both with viable surfaces which is indeed better. Using Sorbact. I wanted to use Apligraf on her however there is the issue of  the vein and vascular appointment on 7/31 at 2:00 in the afternoon which would not allow her to get back to be rewrapped and they would no doubt remove the graft 7/22; the patient's wound surfaces have moderate amount of debris although generally look  better. The lateral one is larger with 2 small satellite areas superiorly. We are waiting for her vein and vascular appointment on 7/31. She has been approved for Apligraf which I would like to use after th 7/29; wound surfaces have improved no debridement is required we have been using Sorbact. She sees vein and vascular on Friday with this so question of whether anything can be done to lessen the likelihood of recurrence and/or speed the healing of these areas. She is already had previous ablations. She no doubt has severe venous hypertension 8/5-Patient returns at 1 week, she was in [place] for 3 days by her podiatrist, we have been using so backed to the wound, she has increased pain in both the wounds on the left lower leg especially the more distal one on the lateral aspect 8/12-Patient returns at 1 week and she is agreeable to having debridement in both wounds on her left leg today. We have been using Sorbact, and vascular studies were reviewed at last visit 8/19; the patient arrives with her wounds fairly clean and no debridement is required. We have used Sorbact which is really done a nice job in cleaning up these very difficult wound surfaces. The patient saw Dr. Donzetta Matters of vascular surgery on 7/31. He did not feel that there was an arterial component. He felt that her treated greater saphenous vein is adequately addressed and that the small saphenous vein did not appear to be involved significantly. She was also noted to have deep venous reflux which is not treatable. Dr. Donzetta Matters mentioned the possibility of a central obstructive component leading to reflux and he offered her central venography. She wanted to discuss this or think about it. I have urged her to go ahead with this. She has had recurrent difficult wounds in these areas which do heal but after months in the clinic. If there is anything that can be done to reduce the likelihood of this I think it is worth it. 9/2 she is still  working towards getting follow-up with Dr. Donzetta Matters to schedule her CT. Things are quite a bit worse venography. I put Apligraf on 2 weeks ago on both wounds on the medial and lateral part of her left lower leg. She arrives in clinic today with 3 superficial additional wounds above the area laterally and one below the wound medially. She describes a lot of discomfort. I think these are probably wrapped injuries. Does not look like she has cellulitis. 07/20/2019 on evaluation today patient appears to be doing somewhat poorly in regard to her lower extremity ulcers. She in fact showed signs of erythema in fact we may even be dealing with an infection at this time. Unfortunately I am unsure if this is just infection or if indeed there may be some allergic reaction that occurred as a result of the Apligraf application. With that being said that would be unusual but nonetheless not impossible in this patient is one who is unfortunately allergic to quite a bit. Currently we have been using the Sorbact which seems to do as well as anything for her. I do think we may want to obtain a culture today to see if there is anything showing up there that may need to be  addressed. 9/16; noted that last week the wounds look worse in 1 week follow-up of the Apligraf. Using Sorbact as of 2 days ago. She arrives with copious amounts of drainage and new skin breakdown on the back of the left calf. The wounds arm more substantial bilaterally. There is a fair amount of swelling in the left calf no overt DVT there is edema present I think in the left greater than right thigh. She is supposed to go on 9/28 for CT venography. The wounds on the medial and lateral calf are worse and she has new skin breakdown posteriorly at least new for me. This is almost developing into a circumferential wound area The Apligraf was taken off last week which I agree with things are not going in the right direction a culture was done we do not have  that back yet. She is on Augmentin that she started 2 days ago 9/23; dressing was changed by her nurses on Monday. In general there is no improvement in the wound areas although the area looks less angry than last week. She did get Augmentin for MSSA cultured on the 14th. She still appears to have too much swelling in the left leg even with 3 layer compression 9/30; the patient underwent her procedure on 9/28 by Dr. Donzetta Matters at [facility]. She was discovered to have the common iliac vein measuring 12.2 mm but at the level of L4-L5 measured 3 mm. After stenting it measured 10 mm. 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 Rouleaux flow in the common femoral and femoral vein was observed much improved after stenting. We are using silver alginate to the wounds on the medial and lateral ankle on the left. 4 layer compression 10/7; the patient had fluid swelling around her knee and 4 layer compression. At the advice of vein and vascular this was reduced to 3 layer which she is tolerating better. We have been using silver alginate under 3 layer compression since last Friday 10/14; arrives with the areas on the left ankle looking a lot better. Inflammation in the area also a lot better. She came in for a nurse check on 10/9 10/21; continued nice improvement. Slight improvements in surface area of both the medial and lateral wounds on the left. A lot of the satellite lesions in the weeping erythema around these from stasis dermatitis is resolved. We have been using silver alginate [name], [name] ([id number]) 10/28; general improvement in the entire wound areas although not a lot of change in dimensions the wound certainly looks better. There is a lot less in terms of venous inflammation. Continue silver alginate this week however look towards Hydrofera Blue next week 11/4; very adherent debris on the medial wound left wound is not as bad. We have been using silver alginate.  Change to [facility] today 11/11; very adherent debris on both wound areas. She went to vein and vascular last week and follow-up they put in [place] boot on this today. He says the [facility] was adherent. Wound is definitely not as good as last week. Especially on the left there the satellite lesions look more prominent 11/18; absolutely no better. erythema on lateral aspect with tenderness. 09/30/2019 on evaluation today patient appears to actually be doing better. Dr. Dellia Nims did put her on doxycycline last week which I do believe has helped her at this point. Fortunately there is no signs of active infection at this time. No fevers, chills, nausea, vomiting, or diarrhea.  I do believe he may want extend the doxycycline for 7 additional days just to ensure everything does completely cleared up the patient is in agreement with that plan. Otherwise she is going require some sharp debridement today 12/2; patient is completing a 2-week course of doxycycline. I gave her this empirically for inflammation as well as infection when I last saw her 2 weeks ago. All of this seems to be better. She is using silver alginate she has the area on the medial aspect of the larger area laterally and the 2 small satellite regions laterally above the major wound. 12/9; the patient's wound on the left medial and left lateral calf look really quite good. We have been using silver alginate. She saw vein and vascular in follow-up on 10/09/2019. She has had a previous left greater saphenous vein ablation by Dr. Oscar La in 2016. More recently she underwent a left common iliac vein stent by Dr. 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 The swelling is improved and certainly the wounds have improved. The patient shows [place] today area on the right medial calf there is almost no wound but leaking lymphedema. She says she start this started 3 or 4 days ago. She did not traumatize it. It is not painful. She does not  wear compression on that side 12/16; the patient continues to do well laterally. Medially still requiring debridement. The area on the right calf did not materialize to anything and is not currently open. We wrapped this last time. She has support stockings for that leg although I am not sure they are going to provide adequate compression 12/23; the lateral wound looks stable. Medially still requiring debridement for tightly adherent fibrinous debris. We've been using silver alginate. Surface area not any different 12/30; neither wound is any better with regards to surface and the area on the left lateral is larger. I been using silver alginate to the left lateral which look quite good last week and Sorbact to the left medial 11/11/2019. Lateral wound area actually looks better and somewhat smaller. Medial still requires a very aggressive debridement today. We have been using Sorbact on both wound areas 1/13; not much better still adherent debris bilaterally. I been using Sorbact. She has severe venous hypertension. Probably some degree of dermal fibrosis distally. I wonder whether tighter compression might help and I am going to try that today. We also need to work on the bioburden 1/20; using Sorbact. She has severe venous hypertension status post stent placement for pelvic vein compression. We applied gentamicin last time to see if we could reduce bioburden I had some discussion with her today about the use of pentoxifylline. This is occasionally used in this setting for wounds with refractory venous insufficiency. However this interacts with Plavix. She tells me that she was put on this after stent placement for 3 months. She will call Dr. Claretha Cooper office to discuss 1/27; we are using gentamicin under Sorbact. 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 She has a stent. Wound medially is measuring smaller this week. Laterally measuring slightly larger although she has some  satellite lesions superiorly 2/3; gentamicin under Sorbact under 4-layer compression. 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 She has a stent on Plavix. Her wounds are measuring smaller this week. More substantially laterally where there is a satellite lesion superiorly. 2/10; gentamicin under Sorbac. 4-layer compression. Patient communicated with Dr. Donzetta Matters at vein and vascular in [place]. He is okay with the patient  coming off Plavix I will therefore start her on pentoxifylline for a 1 month trial. In general her wounds look better today. I had some concerns about swelling in the left thigh however she measures 61.5 on the right and 63 on the mid thigh which does not suggest there is any difficulty. The patient is not describing any pain. 2/17; gentamicin under Sorbac 4-layer compression. She has been on pentoxifylline for 1 week and complains of loose stool. No nausea she is eating and drinking well 2/24; the patient apparently came in 2 days ago for a nurse visit when her wrap fell down. Both areas look a little worse this week macerated medially and satellite lesions laterally. Change to silver alginate today 3/3; wounds are larger today especially medially. She also has more swelling in her foot lower leg and I even noted some swelling in her posterior thigh which is tender. I wonder about the patency of her stent. Fortuitously she sees Dr. Claretha Cooper group on Friday 3/10; Mrs. [name] was seen by vein and vascular on 3/5. The patient underwent ultrasound. There was no evidence of thrombosis involving the IVC no evidence of thrombosis involving the right common iliac vein there is no evidence of thrombosis involving the right external iliac vein the left external vein is also patent. The right common iliac vein stent appears patent bilateral common femoral veins are compressible and appear patent. I was concerned about the left common iliac stent however it looks like  this is functional. She has some edema in the posterior thigh that was tender she still has that this week. I also note they had trouble finding the pulses in her left foot and booked her for an ABI baseline in 4 weeks. She will follow up in 6 months for repeat IVC duplex. The patient stopped the pentoxifylline because of diarrhea. It does not look like that was being effective in any case. I have advised her to go back on her aspirin 81 mg tablet, vascular it also suggested this 3/17; comes in today with her wound surfaces a lot better. The excoriations from last week considerably better probably secondary to the TCA. We have been using silver alginate 3/24; comes in today with smaller wounds both medially and laterally. Both required debridement. There are 2 small satellite areas superiorly laterally. She also has a very odd bandlike area in the mid calf almost looking like there was a weakness in the wrap in a localized area. I would write this off as being this however anteriorly she has a small raised ballotable area that is very tender almost reminiscent of an abscess but there was no obvious purulent surface to it. 02/04/20 upon evaluation today patient appears to be doing fairly well in regard to her wounds today. Fortunately there is no signs of active infection at this time. No fevers, chills, nausea, vomiting, or diarrhea. She has been tolerating the dressing changes without complication. Fortunately I feel like she is showing signs of improvement although has been sometime since have seen her. Nonetheless the area of concern that Dr. Dellia Nims had last week where she had possibly an area of the wrap that was we can allow the leg to bulge appears to be doing significantly better today there is no signs of anything worsening. [name], [name] ([id number]) 4/7; the patient's wounds on her medial and lateral left leg continue to contract. We have been using a regular alginate. Last week she  developed an area on the right medial lower  leg which is probably a venous ulcer as well. 4/14; the wounds on her left medial and lateral lower leg continue to contract. Surface eschar. We have been using regular alginate. The area on the right medial lower leg is closed. We have been putting both legs under 4-layer contraction. The patient went back to see vein and vascular she had arterial studies done which were apparently "quite good" per the patient although I have not read their notes I have never felt she had an arterial issue. 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 This is been longstanding and refractory to exercise, leg elevation and longstanding use of compression wraps in our clinic as well as compression stockings on the times we have been able to get these to heal 4/21; we thought she actually might be close this week however she arrives in clinic with a lot of edema in her upper left calf and into her posterior thigh. This is been an intermittent problem here. She says the wrap fell down but it was replaced with a nurse visit on Monday. We are using calcium alginate to the wounds and the wound sizes there not terribly larger than last week but there is a lot more edema 4/28; again wound edges are smaller on both sides. Her edema is better controlled than last time. She is obtained her compression pumps from [employer] although they have not been to her home to set these up. 5/5; left medial and left lateral both look stable. I am not sure the medial is any smaller. We have been using calcium alginate under 4-layer compression. oShe had an area on the right medial. This was eschared today. We have been wrapping this as well. She does not tolerate external compression stockings due to a history of various contact allergies. She has her compression pumps however the representative from the company is coming on her to show her how  to use these tomorrow 5/19; patient with severe chronic venous insufficiency secondary to central venous disease. She had a stent placed in her left common iliac vein. She has done better since but still difficult to control wounds. She comes in today with nothing open on the right leg. Her areas on the left medial and left lateral are just about closed. We are using calcium alginate under 4-layer compression. She is using her external compression pumps at home She only has 15-20 support stockings. States she cannot get anything tighter than that on. 03/30/20-Patient returns at 1 week, the wounds on the left leg are both slightly bigger, the last week she was on 3 layer compression which started to slide down. She is starting to use her lymphedema pumps although she stated on 1 day her right ankle started to swell up and she have to stop that day. Unfortunately the open area seem to oscillate between improving to the point of healing and then flaring up all to do with effectiveness of compression or lack of due to the left leg topography not keeping the compression wraps from rolling down 6/2 patient comes in with a 15/20 mmHg stocking on the right leg. She tells me that she developed a lot of swelling in her ankles she saw orthopedics she was felt to possibly be having a flare of pseudogout versus some other type of arthritis. She was put on steroids for a respiratory issue so that helps with the inflammation. She has not been using the pumps all week. She thinks the left  thigh is more swollen than usual and I would agree with that. She has an appointment with Dr. Donzetta Matters 9 days or so from now 6/9; both wounds on the left medial and left lateral are smaller. We have been using calcium alginate under compression. She does not have an open wound on the right leg she is using a stocking and her compression pumps things are going well. She has an appointment with Dr. Donzetta Matters with regards to her stent in the  left common iliac vein 6/16; the wounds on the left medial and left lateral ankle continues to contract. The patient saw Dr. Donzetta Matters and I think he seems satisfied. Ordered follow-up venous reflux studies on both sides in September. Cautioned that she may need thigh-high stockings. She has been using calcium alginate under compression on the left and her own stocking on the right leg. She tells [place] there are no open wounds on the right 6/23; left lateral is just about closed. Medial required debridement today. We have been using calcium alginate. Extensive discussion about the compression pumps she is only using these on 25 mmHg states she could not take 40 or 30 when the wrap came out to her home to demonstrate these. He said they should not feel tight 6/30; the left lateral wound has a slight amount of eschar. . The area medially is about the same using Hydrofera Blue. 7/7; left lateral wound still has some eschar. I will remove this next week may be closed. The area medially is very small using Hydrofera Blue with improvement. Unfortunately the stockings fell down. Unfortunately the blisters have developed at the edge of where the wrap fell. When this happened she says her legs hurt she did not use her pumps. We are not open Monday for her to come in and change the wraps and she had an appointment yesterday. She also tells me that she is going to have an MRI of her back. She is having pain radiating into her left anterior leg she thinks her from an L5 disc. She saw Dr. Ellene Route of neurosurgery 7/14; the area on the left lateral ankle area is closed. Still a small area medially however it looks better as well. We have been using Hydrofera Blue under 4-layer compression 7/21; left lateral ankle is still closed however her wound on the medial left calf is actually larger. This is probably because Hydrofera Blue got stuck to the wound. She came in for a nurse change on Friday and will do that again this  week I was concerned about the amount of swelling that she had last week however she is using her compression pumps twice a day and the swelling seems well controlled 7/28; remaining wound on the left medial lower leg is smaller. We have been using moistened silver collagen under compression she is coming back for a nurse visit. For reasons that were not really clear she was just keeping her legs elevated and not using her compression pumps. I have asked her to use the compression pumps. She does not have any wounds on the right leg 06/15/20-Patient returns at 2 weeks, her LLE edema is worse and she developed a blister wound that is new and has bigger posterior calf wound on right, we are using Prisma with pad, 4 layer compression. she has been on lasix 40 mg daily 8/18; patient arrives today with things a lot worse than I remember from a few weeks ago. She was seen last week. Noted that her edema  was worse and that she now had a left lateral wound as well as deteriorating edema in the medial and posterior part of the lower leg. She says she is using his or her external compression pumps once a day although I wonder about the compliance. 8/25; weeping area on the right medial lower leg. This had actually gotten a small localized area of her compression stocking wet. oOn the left side there is a large denuded area on the posterior medial lower leg and smaller area on the lateral. This was not the original areas that we dealt with. 9/1 the patient's wound on the left leg include the left lateral and left posterior. Larger superficial wounds weeping. She has very poor edema control. Tender localized edema in the left lower medial ankle/heel probably because of localized wrap issues. She freely admits she is not using the compression pumps. She has been up on her feet a lot. She thinks the hydrofera blue is contributing to the pain she is experiencing.. This is a complaint that I have occasionally  heard 9/8; really not much improvement. The patient is still complaining of a lot of pain particularly when she uses compression pumps. I switched her to silver alginate last time because she found the Hydrofera Blue to be irritating. I don't hear much difference in her description with the silver alginate. 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 She has follow-up with Dr. Donzetta Matters I think for ultrasound next month KEYONI, LAPINSKI ([id number]) 9/15; quite a bit of improvement today. We have less edema and more epithelialization in both of her wound areas on the left medial and left lateral calf. These are not the site of her original wounds in this area. She says she has been using her compression pumps for 30 minutes twice a day, there pain issues that never quite understood. Silver alginate as the primary dressing 9/22; continued improvement. Both areas medially and laterally still have a small open area there is some eschar. She continues to complain of left medial ankle pain. Swelling in the leg is in much better condition. We have been using silver alginate 9/29; continued improvement. Both areas medially and laterally in the left calf look as though they are close some minor surface eschar but I think this is epithelialized. She comes in today saying she has a ruptured disc at L4-L5 cannot bend over to put on her stockings. 10/6; patient comes in today with no open wounds on either leg. However her edema on the left leg in the upper one third of the lower leg is poorly controlled nonpitting. She says that she could not use the pumps for 2 days and then she has been using the last couple of days. It is not clear to me she has been able to get her stocking on. She has back problems. Mrs. Sickles has severe chronic venous insufficiency with secondary lymphedema. Her venous insufficiency is partially centrally mediated and  that she is now post stent in the left common iliac vein. [facility] Thurner's syndrome/physiology]. She follows up with them on 10/15. She wears 20/30 below-knee stockings. She is supposed to use compression pumps at home although I think her compliance about with this is been less than 100%. I have asked her to use these 3 times a day. Finally I think she has lipodermatosclerosis in the left lower leg with an inverted bottle sign. It is  been a major problem controlling the edema in the left leg. The right leg we have had wounds on but not as significant a problem is on the left READMISSION 04/12/2021 Mrs. Ryle is a 75 year old woman we know well in this clinic. She has severe chronic venous insufficiency. 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 I believe she has had bilateral greater saphenous vein ablation in the past as well. She tells me that this wound opened sometime in March. She had a fall and thinks it was initially abrasion. She developed areas she describes as little blisters on the anterior part of her leg and she saw dermatology and was treated for methicillin staph aureus with several rounds of antibiotics. She has been using support stockings on the left leg and says this is the only thing she can get on. Her compression pump use maybe once a day she says if she did not use one she use the other. She comes in today with incredible swelling in the left leg with a wound on the left posterior calf. She has been using Neosporin to this previously a hydrocolloid. 6/15; patient arrives back for 1 week follow-up.Marland Kitchen Apparently her wrap fell down she did not call us to replace this. He has poor edema control. She only uses her compression pumps once a day 6/29; patient presents for 1 week follow-up. She has tolerated the compression wrap well. We have been using Iodoflex under the wrap. She has no issues or complaints today. 7/6; patient presents for 1  week follow-up. She states that the compression wrap rolled down her leg 4 days ago. She has been trying to keep the area covered but has no dressings at home to use. She denies signs of infection. 7/13; patient presents for 1 week follow-up. She states that her compression wrap rolled down her right leg and she called our office and had it placed a few days ago. She has been tolerating the current wrap well. She states that the Iodoflex is causing a burning sensation. She denies signs of infection. 7/27; patient presents for 1 week follow-up She has tolerated the compression wrap well with Hydrofera Blue underneath. She has now developed a wound to her right lower extremity. She reports having a culture done To this area by her dermatologist that she reports is negative. She currently denies signs of infection. 8/30; patient presents for 1 week follow-up. On the left side she has tolerated the compression wrap well with Hydrofera Blue underneath. She reports some discomfort to her right lower extremity with the 3 layer compression. She currently denies signs of infection. 06/14/2021 upon evaluation today patient's wound actually appears to be doing decently well based on what I am seeing currently. She actually has 2 areas 1 on the left distal/posterior lower leg and the other on the right lower leg. Subsequently the measurements are roughly the same may be slightly smaller but in general have not made any trend towards getting worse which is great news. 06/23/2021 upon evaluation today patient appears to be doing pretty well in regard to her wounds. On the right she is having difficulty with sciatic pain and this subsequently has led to her taking the wrap off over the last week her leg is more swollen she is also been on prednisone for 10 days. With that being said I think we may want to just use a compression sock on the left that way she will not  have to worry about the compression wrap being in  place that she cannot get off. With that being said I do believe as well that the patient is going to need to continue with the wrapping on the left we will also need to do a little bit of sharp debridement today. 8/24; patient presents for 1 week follow-up. She has no issues or complaints today. She denies signs of infection. 8/31; patient presents for 1 week follow-up. She has no issues or complaints today. She has tolerated the compression wrap well on the left lower extremity. She denies signs of infection. 9/7; patient presents for follow-up. She reports that the compression wrap rolled down slightly on her right lower extremity. She reports tenderness to the wound bed. She denies increased warmth or erythema to the surrounding skin. She overall feels well. 9/14; patient presents for follow-up. She has no issues or complaints today. She denies signs of infection. PuraPly is available and patient would like to start this today. 9/21; patient presents for follow-up. She has no issues or complaints today. She tolerated the first skin substitute placement last week under compression. 9/28; patient presents for 1 week follow-up. She has no issues or complaints today. 10/5; patient presents for 1 week follow-up. She reports taking the wrap off yesterday. She was on her feet more yesterday and developed more swelling to her left lower extremity. She denies pain. Overall she is doing well. Electronic Signature(s) Signed: 08/09/2021 2:36:41 PM By: Hewitt Blade ([id number]) Entered By: Kalman Shan on 08/09/2021 14:32:12 Anthes, [name] ([id number]) -------------------------------------------------------------------------------- Physical Exam Details Patient Name: [name], [name]. Date of Service: 08/09/2021 1:45 PM Medical Record Number: [id number] Patient Account Number: [id number] Date of Birth/Sex: May 15, 1946 (75 y.o. F) Treating RN: Donnamarie Poag Primary Care Provider:  [name] Other Clinician: Referring Provider: [name] Treating Provider/Extender: Yaakov Guthrie in Treatment: 17 Constitutional . Cardiovascular . Psychiatric . Notes Left lower extremity: To the distal medial aspect there is epithelialization to previous wound site. Minimal dried drainage noted. Leg is slightly more swollen than usual. No increased warmth erythema or pain on exam. Electronic Signature(s) Signed: 08/09/2021 2:36:41 PM By: Kalman Shan DO Entered By: Kalman Shan on 08/09/2021 14:33:27 Hartman, [name] ([id number]) -------------------------------------------------------------------------------- Physician Orders Details Patient Name: [name], [name]. Date of Service: 08/09/2021 1:45 PM Medical Record Number: [id number] Patient Account Number: [id number] Date of Birth/Sex: [birth year] (75 y.o. F) Treating RN: Donnamarie Poag Primary Care Provider: [name] Other Clinician: Referring Provider: [name] Treating Provider/Extender: Yaakov Guthrie in Treatment: 49 Verbal / Phone Orders: No Diagnosis Coding Follow-up Appointments Wound #13 Left,Distal,Posterior Lower Leg o Return Appointment in 1 week. o Nurse Visit as needed Bathing/ Shower/ Hygiene o May shower with wound dressing protected with water repellent cover or cast protector. o No tub bath. Anesthetic (Use 'Patient Medications' Section for Anesthetic Order Entry) o Lidocaine applied to wound bed Edema Control - Lymphedema / Segmental Compressive Device / Other o Optional: One layer of unna paste to top of compression wrap (to act as an anchor). o Patient to wear own compression stockings. Remove compression stockings every night before going to bed and put on every morning when getting up. - right leg o Elevate, Exercise Daily and Avoid Standing for Long Periods of Time. o Elevate legs to the level of the heart and pump ankles as  often as possible o Elevate leg(s) parallel to the floor when sitting. o Compression Pump: Use compression pump on  left lower extremity for 60 minutes, twice daily. - TWICE DAILY for 1 hour o Compression Pump: Use compression pump on right lower extremity for 60 minutes, twice daily. - TWICE DAILY for 1 hour Wound Treatment Wound #13 - Lower Leg Wound Laterality: Left, Posterior, Distal Cleanser: Soap and Water 1 x Per Week/30 Days Discharge Instructions: Gently cleanse wound with antibacterial soap, rinse and pat dry prior to dressing wounds Primary Dressing: Hydrofera Blue Ready Transfer Foam, 2.5x2.5 (in/in) 1 x Per Week/30 Days Discharge Instructions: Apply Hydrofera Blue Ready to wound bed as directed Secondary Dressing: ABD Pad 5x9 (in/in) 1 x Per Week/30 Days Discharge Instructions: Cover with ABD pad Compression Wrap: Profore Lite LF 3 Multilayer Compression Bandaging System 1 x Per Week/30 Days Discharge Instructions: Apply 3 multi-layer wrap as prescribed. Electronic Signature(s) Signed: 08/09/2021 2:36:41 PM By: Kalman Shan DO Signed: 08/09/2021 3:06:25 PM By: Donnamarie Poag Entered By: Donnamarie Poag on 08/09/2021 14:27:53 Shaler, [name] ([id number]) -------------------------------------------------------------------------------- Problem List Details Patient Name: [name], [name]. Date of Service: 08/09/2021 1:45 PM Medical Record Number: [id number] Patient Account Number: [id number] Date of Birth/Sex: 1946-03-15 (75 y.o. F) Treating RN: Donnamarie Poag Primary Care Provider: [name] Other Clinician: Referring Provider: [name] Treating Provider/Extender: Yaakov Guthrie in Treatment: 17 Active Problems ICD-10 Encounter Code Description Active Date MDM Diagnosis I87.332 Chronic venous hypertension (idiopathic) with ulcer and inflammation of 04/12/2021 No Yes left lower extremity I89.0 Lymphedema, not elsewhere classified 04/12/2021 No Yes L97.221  Non-pressure chronic ulcer of left calf limited to breakdown of skin 04/12/2021 No Yes Inactive Problems Resolved Problems ICD-10 Code Description Active Date Resolved Date S81.801A Unspecified open wound, right lower leg, initial encounter 05/31/2021 05/31/2021 Electronic Signature(s) Signed: 08/09/2021 2:36:41 PM By: Kalman Shan DO Entered By: Kalman Shan on 08/09/2021 14:30:32 Pouncey, [name] ([id number]) -------------------------------------------------------------------------------- Progress Note Details Patient Name: [name], [name]. Date of Service: 08/09/2021 1:45 PM Medical Record Number: [id number] Patient Account Number: [id number] Date of Birth/Sex: 1946/01/19 (75 y.o. F) Treating RN: Donnamarie Poag Primary Care Provider: [name] Other Clinician: Referring Provider: [name] Treating Provider/Extender: Yaakov Guthrie in Treatment: 17 Subjective Chief Complaint Information obtained from Patient Left calf venous stasis ulceration. 11/13/16; the patient is here for a left calf recurrent ulceration which is painful and has been present for the last month 01/22/17; the patient re-presents here for recurrent difficulties with blistering and leaking fluid on her left posterior calf 12/10/18; the patient returns to clinic for a wound on the left medial calf 04/12/2021; left lower extremity wound, 05/31/21; right leg wound History of Present Illness (HPI) Pleasant 75 year old with history of chronic venous insufficiency. No diabetes or peripheral vascular disease. Left ABI 1.29. Questionable history of left lower extremity DVT. She developed a recurrent ulceration on her left lateral calf in December 2015, which she attributes to poor diet and subsequent lower extremity edema. She underwent endovenous laser ablation of her left greater saphenous vein in 2010. She underwent laser ablation of accessory branch of left GSV in April 2016 by Dr. Kellie Simmering at [facility]. She  was previously wearing Unna boots, which she tolerated well. Tolerating 2 layer compression and cadexomer iodine. She returns to clinic for follow-up and is without new complaints. She denies any significant pain at this time. She reports persistent pain with pressure. No claudication or ischemic rest pain. No fever or chills. No drainage. READMISSION 11/13/16; this is a 75 year old woman who is not a diabetic. She is here for a review of a painful  area on her left medial lower extremity. I note that she was seen here previously last year for wound I believe to be in the same area. At that time she had undergone previously a left greater saphenous vein ablation by Dr. Kellie Simmering and she had a ablation of the anterior accessory branch of the left greater saphenous vein in March 2016. Seeing that the wound actually closed over. In reviewing the history with her today the ulcer in this area has been recurrent. She describes a biopsy of this area in 2009 that only showed stasis physiology. She also has a history of today malignant melanoma in the right shoulder for which she follows with Dr. 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 Do not see that she has ever had arterial studies in the left leg. She tells me she has a follow-up with Dr. Kellie Simmering in roughly 10 days In any case she developed the reopening of this area roughly a month ago. On the background of this she describes rapidly increasing edema which has responded to Lasix 40 mg and metolazone 2.5 mg as well as the patient's lymph massage. She has been told she has both venous insufficiency and lymphedema but she cannot tolerate compression stockings 11/28/16; the patient saw Dr. Kellie Simmering recently. Per the patient he did arterial Dopplers in the office that did not show evidence of arterial insufficiency, per the patient he stated "treat  this like an ordinary venous ulcer". She also saw her dermatologist Dr. Ronnald Ramp who felt that this was more of a vascular ulcer. In general things are improving although she arrives today with increasing bilateral lower extremity edema with weeping a deeper fluid through the wound on the left medial leg compatible with some degree of lymphedema 12/04/16; the patient's wound is fully epithelialized but I don't think fully healed. We will do another week of depression with Promogran and TCA however I suspect we'll be able to discharge her next week. This is a very unusual-looking wound which was initially a figure-of-eight type wound lying on its side surrounded by petechial like hemorrhage. She has had venous ablation on this side. She apparently does not have an arterial issue per Dr. Kellie Simmering. She saw her dermatologist thought it was "vascular". Patient is definitely going to need ongoing compression and I talked about this with her today she will go to elastic therapy after she leaves here next week 12/11/16; the patient's wound is not completely closed today. She has surrounding scar tissue and in further discussion with the patient it would appear that she had ulcers in this area in 2009 for a prolonged period of time ultimately requiring a punch biopsy of this area that only showed venous insufficiency. I did not previously pickup on this part of the history from the patient. 12/18/16; the patient's wound is completely epithelialized. There is no open area here. She has significant bilateral venous insufficiency with secondary lymphedema to a mild-to-moderate degree she does not have compression stockings.. She did not say anything to me when I was in the room, she told our intake nurse that she was still having pain in this area. This isn't unusual recurrent small open area. She is going to go to elastic therapy to obtain compression stockings. 12/25/16; the patient's wound is fully epithelialized. There  is no open area here. The patient describes some continued episodic discomfort in this  area medial left calf. However everything looks fine and healed here. She is been to elastic therapy and caught herself 15-20 mmHg stockings, they apparently were having trouble getting 20-30 mm stockings in her size 01/22/17; this is a patient we discharged from the clinic a month ago. She has a recurrent open wound on her medial left calf. She had 15 mm support stockings. I told her I thought she needed 20-30 mm compression stockings. She tells me that she has been ill with hospitalization secondary to asthma and is been found to have severe hypokalemia likely secondary to a combination of Lasix and metolazone. This morning she noted blistering and leaking fluid on the posterior part of her left leg. She called our intake nurse urgently and we was saw her this afternoon. She has not had any real discomfort here. I don't know that she's been wearing any stockings on this leg for at least 2-3 days. ABIs in this clinic were 1.21 on the right and 1.3 on the left. She is previously seen vascular surgery who does not think that there is a peripheral arterial issue. 01/30/17; Patient arrives with no open wound on the left leg. She has been to elastic therapy and obtained 20-88mmhg below knee stockings and she has one on the right leg today. READMISSION 02/19/18; this Macaluso is a now 75 year old patient we've had in this clinic perhaps 3 times before. I had last looked at her from January 07 December 2016 with an area on the medial left leg. We discharged her on 12/25/16 however she had to be readmitted on 01/22/17 with a recurrence. I have in my notes that we discharged her on 20-30 mm stockings although she tells me she was only wearing support hose because she cannot get stockings on predominantly related to her cervical spine surgery/issues. She has had previous ablations done by vein and vascular in [place] including a  great saphenous vein ablation on the left with an anterior accessory branch ablation I think both of these were in 2016. On one of the previous visit she had a biopsy noted 2009 that was negative. She is not felt to have an arterial issue. She is not a diabetic. She does have a DONNELLA, MORFORD. ([id number]) history of obstructive sleep apnea hypertension asthma as well as chronic venous insufficiency and lymphedema. On this occasion she noted 2 dry scaly patch on her left leg. She tried to put lotion on this it didn't really help. There were 2 open areas.the patient has been seeing her primary physician from 02/05/18 through 02/14/18. She had Unna boots applied. The superior wound now on the lateral left leg has closed but she's had one wound that remains open on the lateral left leg. This is not the same spot as we dealt with in 2018. ABIs in this clinic were 1.3 bilaterally 02/26/18; patient has a small wound on the left lateral calf. Dimensions are down. She has chronic venous insufficiency and lymphedema. 03/05/18; small open area on the left lateral calf. Dimensions are down. Tightly adherent necrotic debris over the surface of the wound which was difficult to remove. Also the dressing [over collagen] stuck to the wound surface. This was removed with some difficulty as well. Change the primary dressing to Hydrofera Blue ready 03/12/18; small open area on the left lateral calf. Comes in with tightly adherent surface eschar as well as some adherent Hydrofera Blue. 03/19/18; open area on the left lateral calf. Again adherent surface eschar as well as  some adherent Hydrofera Blue nonviable subcutaneous tissue. She complained of pain all week even with the reduction from 4-3 layer compression I put on last week. Also she had an increase in her ankle and calf measurements probably related to the same thing. 03/26/18; open area on the left lateral calf. A very small open area remains here. We used silver alginate  starting last week as the Hydrofera Blue seem to stick to the wound bed. In using 4-layer compression 04/02/18; the open area in the left lateral calf at some adherent slough which I removed there is no open area here. We are able to transition her into her own compression stocking. Truthfully I think this is probably his support hose. However this does not maintain skin integrity will be limited. She cannot put over the toe compression stockings on because of neck problems hand problems etc. She is allergic to the lining layer of juxta lites. We might be forced to use extremitease stocking should this fail READMIT 11/24/2018 Patient is now a 75 year old woman who is not a diabetic. She has been in this clinic on at least 3 previous occasions largely with recurrent wounds on her left leg secondary to chronic venous insufficiency with secondary lymphedema. Her situation is complicated by inability to get stockings on and an allergy to neoprene which is apparently a component and at least juxta lites and other stockings. As a result she really has not been wearing any stockings on her legs. She tells [place] that roughly 2 or 3 weeks ago she started noticing a stinging sensation just above her ankle on the left medial aspect. She has been diagnosed with pseudogout and she wondered whether this was what she was experiencing. She tried to dress this with something she bought at the store however subsequently it pulled skin off and now she has an open wound that is not improving. She has been using Vaseline gauze with a cover bandage. She saw her primary doctor last week who put an Haematologist on her. ABIs in this clinic was 1.03 on the left 2/12; the area is on the left medial ankle. Odd-looking wound with what looks to be surface epithelialization but a multitude of small petechial openings. This clearly not closed yet. We have been using silver alginate under 3 layer compression with TCA 2/19; the wound area  did not look quite as good this week. Necrotic debris over the majority of the wound surface which required debridement. She continues to have a multitude of what looked to be small petechial openings. She reminds [place] that she had a biopsy on this initially during her first outbreak in 2015 in [facility]. She expresses concern about this being a possible melanoma. 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 I assured her that this does not look anything like melanoma. Except for the petechial reaction it does look like a venous insufficiency area and she certainly has evidence of this on both sides 2/26; a difficult area on the left medial ankle. The patient clearly has chronic venous hypertension with some degree of lymphedema. The odd thing about the area is the small petechial hemorrhages. I am not really sure how to explain this. This was present last time and this is not a compression injury. We have been using Hydrofera Blue which I changed to last week 3/4; still using Hydrofera Blue. Aggressive debridement today. She does not have known arterial  issues. She has seen Dr. Kellie Simmering at [facility] and and has an ablation on the left. [Anterior accessory branch of the greater saphenous]. From what I remember they did not feel she had an arterial issue. The patient has had this area biopsied in 2009 at [facility] dermatology and by her recollection they said this was "stasis". She is also follow-up with dermatology locally who thought that this was more of a vascular issue 3/11; using Hydrofera Blue. Aggressive debridement today. She does not have an arterial issue. We are using 3 layer compression although we may need to go to 4. The patient has been in for multiple changes to her wrap since I last saw her a week ago. She says that the area was leaking. I do not have too much more  information on what was found 01/19/19 on evaluation today patient was actually being seen for a nurse visit when unfortunately she had the area on her left lateral lower extremity as well as weeping from the right lower extremity that became apparent. Therefore we did end up actually seeing her for a full visit with myself. She is having some pain at this site as well but fortunately nothing too significant at this point. No fevers, chills, nausea, or vomiting noted at this time. 3/18-Patient is back to the clinic with the left leg venous leg ulcer, the ulcer is larger in size, has a surface that is densely adherent with fibrinous tissue, the Hydrofera Blue was used but is densely adherent and there was difficulty in removing it. The right lower extremity was also wrapped for weeping edema. Patient has a new area over the left lateral foot above the malleolus that is small and appears to have no debris with intact surrounding skin. Patient is on increased dose of Lasix also as a means to edema management 3/25; the patient has a nonhealing venous ulcer on the medial left leg and last week developed a smaller area on the lateral left calf. We have been using Hydrofera Blue with a contact layer. 4/1; no major change in these wounds areas. Left medial and more recently left lateral calf. I tried Iodoflex last week to aid in debridement she did not tolerate this. She stated her pain was terrible all week. She took the top layer of the 4 layer compression off. 4/8; the patient actually looks somewhat better in terms of her more prominent left lateral calf wound. There is some healthy looking tissue here. She is still complaining of a lot of discomfort. 4/15; patient in a lot of pain secondary to sciatica. She is on a prednisone taper prescribed by her primary physician. She has the 2 areas one on the left medial and more recently a smaller area on the left lateral calf. Both of these just above the  malleoli 4/22; her back pain is better but she still states she is very uncomfortable and now feels she is intolerant to the [employer]. No real change in the wounds we have been using Sorbact. She has been previously intolerant to Iodoflex. There is not a lot of option about what we can use to debride this wound under compression that she no doubt needs. sHe states Ultram no longer works for her pain 4/29; no major change in the wounds slightly increased depth. Surface on the original medial wound perhaps somewhat improved however the more recent area on the lateral left ankle is 100% covered in very adherent debris we have been using Sorbact.  She tolerates 4 layer compression well and her edema control is a lot better. She has not had to come in for a nurse check 5/6; no major change in the condition of the wounds. She did consent to debridement today which was done with some difficulty. Continuing Sorbact. She did not tolerate Iodoflex. She was in for a check of her compression the day after we wrapped her last week this was adjusted but KINZIE, WICKES. ([id number]) nothing much was found 5/13; no major change in the condition or area of the wounds. I was able to get a fairly aggressive debridement done on the lateral left leg wound. Even using Sorbact under compression. She came back in on Friday to have the wrap changed. She says she felt uncomfortable on the lateral aspect of her ankle. She has a long history of chronic venous insufficiency including previous ablation surgery on this side. 5/20-Patient returns for wounds on left leg with both wounds covered in slough, with the lateral leg wound larger in size, she has been in 3 layer compression and felt more comfortable, she describes pain in ankle, in leg and pins and needles in foot, and is about to try Pamelor for this 6/3; wounds on the left lateral and left medial leg. The area medially which is the most recent of the 2 seems to have had  the largest increase in dimensions. We have been using Sorbac to try and debride the surface. She has been to see orthopedics they apparently did a plain x-ray that was indeterminant. Diagnosed her with neuropathy and they have ordered an MRI to determine if there is underlying osteomyelitis. This was not high on my thought list but I suppose it is prudent. We have advised her to make an appointment with vein and vascular in [place]. She has a history of a left greater saphenous and accessory vein ablations I wonder if there is anything else that can be done from a surgical point of view to help in these difficult refractory wounds. We have previously healed this wound on one occasion but it keeps on reopening [medial side] 6/10; deep tissue culture I did last week I think on the left medial wound showed both moderate E. coli and moderate staph aureus [MSSA]. She is going to require antibiotics and I have chosen Augmentin. We have been using Sorbact and we have made better looking wound surface on both sides but certainly no improvement in wound area. She was back in last Friday apparently for a dressing changes the wrap was hurting her outer left ankle. She has not managed to get a hold of vein and vascular in [place]. We are going to have to make her that appointment 6/17; patient is tolerating the Augmentin. She had an MRI that I think was ordered by orthopedic surgeon this did not show osteomyelitis or an abscess did suggest cellulitis. We have been using Sorbact to the lateral and medial ankles. We have been trying to arrange a follow-up appointment with vein and vascular in [place] or did her original ablations. We apparently an area sent the request to vein and vascular in [facility] 6/24; patient has completed the Augmentin. We do not yet have a vein and vascular appointment in [place]. I am not sure what the issue is here we have asked her to call tomorrow. We are using Sorbact.  Making some improvements and especially the medial wound. Both surfaces however look better medial and lateral. 7/1; the patient has been in contact  with vein and vascular in [place] but has not yet received an appointment. Using Sorbact we have gradually improve the wound surface with no improvement in surface area. She is approved for Apligraf but the wound surface still is not completely viable. She has not had to come in for a dressing change 7/8; the patient has an appointment with vein and vascular on 7/31 which is a Friday afternoon. She is concerned about getting back here for [place] to dress her wounds. I think it is important to have them goal for her venous reflux/history of ablations etc. to see if anything else can be done. She apparently tested positive for 1 of the blood tests with regards to lupus and saw a rheumatologist. He has raised the issue of vasculitis again. I have had this thought in the past however the evidence seems overwhelming that this is a venous reflux etiology. If the rheumatologist tells me there is clinical and laboratory investigation is positive for lupus I will rethink this. 7/15; the patient's wound surfaces are quite a bit better. The medial area which was her original wound now has no depth although the lateral wound which was the more recent area actually appears larger. Both with viable surfaces which is indeed better. Using Sorbact. I wanted to use Apligraf on her however there is the issue of the vein and vascular appointment on 7/31 at 2:00 in the afternoon which would not allow her to get back to be rewrapped and they would no doubt remove the graft 7/22; the patient's wound surfaces have moderate amount of debris although generally look better. The lateral one is larger with 2 small satellite areas superiorly. We are waiting for her vein and vascular appointment on 7/31. She has been approved for Apligraf which I would like to use after th 7/29; wound  surfaces have improved no debridement is required we have been using Sorbact. She sees vein and vascular on Friday with this so question of whether anything can be done to lessen the likelihood of recurrence and/or speed the healing of these areas. She is already had previous ablations. She no doubt has severe venous hypertension 8/5-Patient returns at 1 week, she was in [place] for 3 days by her podiatrist, we have been using so backed to the wound, she has increased pain in both the wounds on the left lower leg especially the more distal one on the lateral aspect 8/12-Patient returns at 1 week and she is agreeable to having debridement in both wounds on her left leg today. We have been using Sorbact, and vascular studies were reviewed at last visit 8/19; the patient arrives with her wounds fairly clean and no debridement is required. We have used Sorbact which is really done a nice job in cleaning up these very difficult wound surfaces. The patient saw Dr. Donzetta Matters of vascular surgery on 7/31. He did not feel that there was an arterial component. He felt that her treated greater saphenous vein is adequately addressed and that the small saphenous vein did not appear to be involved significantly. She was also noted to have deep venous reflux which is not treatable. Dr. Donzetta Matters mentioned the possibility of a central obstructive component leading to reflux and he offered her central venography. She wanted to discuss this or think about it. I have urged her to go ahead with this. She has had recurrent difficult wounds in these areas which do heal but after months in the clinic. If there is anything that can  be done to reduce the likelihood of this I think it is worth it. 9/2 she is still working towards getting follow-up with Dr. Donzetta Matters to schedule her CT. Things are quite a bit worse venography. I put Apligraf on 2 weeks ago on both wounds on the medial and lateral part of her left lower leg. She arrives in  clinic today with 3 superficial additional wounds above the area laterally and one below the wound medially. She describes a lot of discomfort. I think these are probably wrapped injuries. Does not look like she has cellulitis. 07/20/2019 on evaluation today patient appears to be doing somewhat poorly in regard to her lower extremity ulcers. She in fact showed signs of erythema in fact we may even be dealing with an infection at this time. Unfortunately I am unsure if this is just infection or if indeed there may be some allergic reaction that occurred as a result of the Apligraf application. With that being said that would be unusual but nonetheless not impossible in this patient is one who is unfortunately allergic to quite a bit. Currently we have been using the Sorbact which seems to do as well as anything for her. I do think we may want to obtain a culture today to see if there is anything showing up there that may need to be addressed. 9/16; noted that last week the wounds look worse in 1 week follow-up of the Apligraf. Using Sorbact as of 2 days ago. She arrives with copious amounts of drainage and new skin breakdown on the back of the left calf. The wounds arm more substantial bilaterally. There is a fair amount of swelling in the left calf no overt DVT there is edema present I think in the left greater than right thigh. She is supposed to go on 9/28 for CT venography. The wounds on the medial and lateral calf are worse and she has new skin breakdown posteriorly at least new for me. This is almost developing into a circumferential wound area The Apligraf was taken off last week which I agree with things are not going in the right direction a culture was done we do not have that back yet. She is on Augmentin that she started 2 days ago 9/23; dressing was changed by her nurses on Monday. In general there is no improvement in the wound areas although the area looks less angry than last week. She  did get Augmentin for MSSA cultured on the 14th. She still appears to have too much swelling in the left leg even with 3 layer compression 9/30; the patient underwent her procedure on 9/28 by Dr. Donzetta Matters at vascular and vein specialist. She was discovered to have the common iliac vein measuring 12.2 mm but at the level of L4-L5 measured 3 mm. After stenting it measured 10 mm. 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 Rouleaux flow in the common femoral and femoral vein was observed much improved after stenting. We are using silver alginate to the wounds on the medial and lateral ankle on the left. 4 layer compression [name], [name] ([id number]) 10/7; the patient had fluid swelling around her knee and 4 layer compression. At the advice of vein and vascular this was reduced to 3 layer which she is tolerating better. We have been using silver alginate under 3 layer compression since last Friday 10/14; arrives with the areas on the left ankle looking a lot better. Inflammation in the area also a lot better. She  came in for a nurse check on 10/9 10/21; continued nice improvement. Slight improvements in surface area of both the medial and lateral wounds on the left. A lot of the satellite lesions in the weeping erythema around these from stasis dermatitis is resolved. We have been using silver alginate 10/28; general improvement in the entire wound areas although not a lot of change in dimensions the wound certainly looks better. There is a lot less in terms of venous inflammation. Continue silver alginate this week however look towards Hydrofera Blue next week 11/4; very adherent debris on the medial wound left wound is not as bad. We have been using silver alginate. Change to [facility] today 11/11; very adherent debris on both wound areas. She went to vein and vascular last week and follow-up they put in [place] boot on this today. He says the [facility] was adherent. Wound is  definitely not as good as last week. Especially on the left there the satellite lesions look more prominent 11/18; absolutely no better. erythema on lateral aspect with tenderness. 09/30/2019 on evaluation today patient appears to actually be doing better. Dr. Dellia Nims did put her on doxycycline last week which I do believe has helped her at this point. Fortunately there is no signs of active infection at this time. No fevers, chills, nausea, vomiting, or diarrhea. I do believe he may want extend the doxycycline for 7 additional days just to ensure everything does completely cleared up the patient is in agreement with that plan. Otherwise she is going require some sharp debridement today 12/2; patient is completing a 2-week course of doxycycline. I gave her this empirically for inflammation as well as infection when I last saw her 2 weeks ago. All of this seems to be better. She is using silver alginate she has the area on the medial aspect of the larger area laterally and the 2 small satellite regions laterally above the major wound. 12/9; the patient's wound on the left medial and left lateral calf look really quite good. We have been using silver alginate. She saw vein and vascular in follow-up on 10/09/2019. She has had a previous left greater saphenous vein ablation by Dr. Oscar La in 2016. More recently she underwent a left common iliac vein stent by Dr. 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 The swelling is improved and certainly the wounds have improved. The patient shows [place] today area on the right medial calf there is almost no wound but leaking lymphedema. She says she start this started 3 or 4 days ago. She did not traumatize it. It is not painful. She does not wear compression on that side 12/16; the patient continues to do well laterally. Medially still requiring debridement. The area on the right calf did not materialize to anything and is not currently open. We wrapped this  last time. She has support stockings for that leg although I am not sure they are going to provide adequate compression 12/23; the lateral wound looks stable. Medially still requiring debridement for tightly adherent fibrinous debris. We've been using silver alginate. Surface area not any different 12/30; neither wound is any better with regards to surface and the area on the left lateral is larger. I been using silver alginate to the left lateral which look quite good last week and Sorbact to the left medial 11/11/2019. Lateral wound area actually looks better and somewhat smaller. Medial still requires a very aggressive debridement today. We have been using Sorbact on both wound  areas 1/13; not much better still adherent debris bilaterally. I been using Sorbact. She has severe venous hypertension. Probably some degree of dermal fibrosis distally. I wonder whether tighter compression might help and I am going to try that today. We also need to work on the bioburden 1/20; using Sorbact. She has severe venous hypertension status post stent placement for pelvic vein compression. We applied gentamicin last time to see if we could reduce bioburden I had some discussion with her today about the use of pentoxifylline. This is occasionally used in this setting for wounds with refractory venous insufficiency. However this interacts with Plavix. She tells me that she was put on this after stent placement for 3 months. She will call Dr. Claretha Cooper office to discuss 1/27; we are using gentamicin under Sorbact. 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 She has a stent. Wound medially is measuring smaller this week. Laterally measuring slightly larger although she has some satellite lesions superiorly 2/3; gentamicin under Sorbact under 4-layer compression. 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 She has a stent on Plavix. Her wounds are measuring smaller this  week. More substantially laterally where there is a satellite lesion superiorly. 2/10; gentamicin under Sorbac. 4-layer compression. Patient communicated with Dr. Donzetta Matters at vein and vascular in [place]. He is okay with the patient coming off Plavix I will therefore start her on pentoxifylline for a 1 month trial. In general her wounds look better today. I had some concerns about swelling in the left thigh however she measures 61.5 on the right and 63 on the mid thigh which does not suggest there is any difficulty. The patient is not describing any pain. 2/17; gentamicin under Sorbac 4-layer compression. She has been on pentoxifylline for 1 week and complains of loose stool. No nausea she is eating and drinking well 2/24; the patient apparently came in 2 days ago for a nurse visit when her wrap fell down. Both areas look a little worse this week macerated medially and satellite lesions laterally. Change to silver alginate today 3/3; wounds are larger today especially medially. She also has more swelling in her foot lower leg and I even noted some swelling in her posterior thigh which is tender. I wonder about the patency of her stent. Fortuitously she sees Dr. Claretha Cooper group on Friday 3/10; Mrs. Hilyer was seen by vein and vascular on 3/5. The patient underwent ultrasound. There was no evidence of thrombosis involving the IVC no evidence of thrombosis involving the right common iliac vein there is no evidence of thrombosis involving the right external iliac vein the left external vein is also patent. The right common iliac vein stent appears patent bilateral common femoral veins are compressible and appear patent. I was concerned about the left common iliac stent however it looks like this is functional. She has some edema in the posterior thigh that was tender she still has that this week. I also note they had trouble finding the pulses in her left foot and booked her for an ABI baseline in 4 weeks.  She will follow up in 6 months for repeat IVC duplex. The patient stopped the pentoxifylline because of diarrhea. It does not look like that was being effective in any case. I have advised her to go back on her aspirin 81 mg tablet, vascular it also suggested this 3/17; comes in today with her wound surfaces a lot better. The excoriations from last week considerably better probably secondary to the TCA. We  have been using silver alginate 3/24; comes in today with smaller wounds both medially and laterally. Both required debridement. There are 2 small satellite areas superiorly laterally. She also has a very odd bandlike area in the mid calf almost looking like there was a weakness in the wrap in a localized area. I would write this off as being this however anteriorly she has a small raised ballotable area that is very tender almost reminiscent of an abscess but there was no [name], [name]. ([id number]) obvious purulent surface to it. 02/04/20 upon evaluation today patient appears to be doing fairly well in regard to her wounds today. Fortunately there is no signs of active infection at this time. No fevers, chills, nausea, vomiting, or diarrhea. She has been tolerating the dressing changes without complication. Fortunately I feel like she is showing signs of improvement although has been sometime since have seen her. Nonetheless the area of concern that Dr. Dellia Nims had last week where she had possibly an area of the wrap that was we can allow the leg to bulge appears to be doing significantly better today there is no signs of anything worsening. 4/7; the patient's wounds on her medial and lateral left leg continue to contract. We have been using a regular alginate. Last week she developed an area on the right medial lower leg which is probably a venous ulcer as well. 4/14; the wounds on her left medial and lateral lower leg continue to contract. Surface eschar. We have been using regular alginate.  The area on the right medial lower leg is closed. We have been putting both legs under 4-layer contraction. The patient went back to see vein and vascular she had arterial studies done which were apparently "quite good" per the patient although I have not read their notes I have never felt she had an arterial issue. 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 This is been longstanding and refractory to exercise, leg elevation and longstanding use of compression wraps in our clinic as well as compression stockings on the times we have been able to get these to heal 4/21; we thought she actually might be close this week however she arrives in clinic with a lot of edema in her upper left calf and into her posterior thigh. This is been an intermittent problem here. She says the wrap fell down but it was replaced with a nurse visit on Monday. We are using calcium alginate to the wounds and the wound sizes there not terribly larger than last week but there is a lot more edema 4/28; again wound edges are smaller on both sides. Her edema is better controlled than last time. She is obtained her compression pumps from [employer] although they have not been to her home to set these up. 5/5; left medial and left lateral both look stable. I am not sure the medial is any smaller. We have been using calcium alginate under 4-layer compression. She had an area on the right medial. This was eschared today. We have been wrapping this as well. She does not tolerate external compression stockings due to a history of various contact allergies. She has her compression pumps however the representative from the company is coming on her to show her how to use these tomorrow 5/19; patient with severe chronic venous insufficiency secondary to central venous disease. She had a stent placed in her left common iliac vein. She has done better since but still difficult to control  wounds. She comes in today with nothing open on the right leg. Her areas on the left medial and left lateral are just about closed. We are using calcium alginate under 4-layer compression. She is using her external compression pumps at home She only has 15-20 support stockings. States she cannot get anything tighter than that on. 03/30/20-Patient returns at 1 week, the wounds on the left leg are both slightly bigger, the last week she was on 3 layer compression which started to slide down. She is starting to use her lymphedema pumps although she stated on 1 day her right ankle started to swell up and she have to stop that day. Unfortunately the open area seem to oscillate between improving to the point of healing and then flaring up all to do with effectiveness of compression or lack of due to the left leg topography not keeping the compression wraps from rolling down 6/2 patient comes in with a 15/20 mmHg stocking on the right leg. She tells me that she developed a lot of swelling in her ankles she saw orthopedics she was felt to possibly be having a flare of pseudogout versus some other type of arthritis. She was put on steroids for a respiratory issue so that helps with the inflammation. She has not been using the pumps all week. She thinks the left thigh is more swollen than usual and I would agree with that. She has an appointment with Dr. Donzetta Matters 9 days or so from now 6/9; both wounds on the left medial and left lateral are smaller. We have been using calcium alginate under compression. She does not have an open wound on the right leg she is using a stocking and her compression pumps things are going well. She has an appointment with Dr. Donzetta Matters with regards to her stent in the left common iliac vein 6/16; the wounds on the left medial and left lateral ankle continues to contract. The patient saw Dr. Donzetta Matters and I think he seems satisfied. Ordered follow-up venous reflux studies on both sides in  September. Cautioned that she may need thigh-high stockings. She has been using calcium alginate under compression on the left and her own stocking on the right leg. She tells [place] there are no open wounds on the right 6/23; left lateral is just about closed. Medial required debridement today. We have been using calcium alginate. Extensive discussion about the compression pumps she is only using these on 25 mmHg states she could not take 40 or 30 when the wrap came out to her home to demonstrate these. He said they should not feel tight 6/30; the left lateral wound has a slight amount of eschar. . The area medially is about the same using Hydrofera Blue. 7/7; left lateral wound still has some eschar. I will remove this next week may be closed. The area medially is very small using Hydrofera Blue with improvement. Unfortunately the stockings fell down. Unfortunately the blisters have developed at the edge of where the wrap fell. When this happened she says her legs hurt she did not use her pumps. We are not open Monday for her to come in and change the wraps and she had an appointment yesterday. She also tells me that she is going to have an MRI of her back. She is having pain radiating into her left anterior leg she thinks her from an L5 disc. She saw Dr. Ellene Route of neurosurgery 7/14; the area on the left lateral ankle area is closed. Still a  small area medially however it looks better as well. We have been using Hydrofera Blue under 4-layer compression 7/21; left lateral ankle is still closed however her wound on the medial left calf is actually larger. This is probably because Hydrofera Blue got stuck to the wound. She came in for a nurse change on Friday and will do that again this week I was concerned about the amount of swelling that she had last week however she is using her compression pumps twice a day and the swelling seems well controlled 7/28; remaining wound on the left medial lower leg is  smaller. We have been using moistened silver collagen under compression she is coming back for a nurse visit. For reasons that were not really clear she was just keeping her legs elevated and not using her compression pumps. I have asked her to use the compression pumps. She does not have any wounds on the right leg 06/15/20-Patient returns at 2 weeks, her LLE edema is worse and she developed a blister wound that is new and has bigger posterior calf wound on right, we are using Prisma with pad, 4 layer compression. she has been on lasix 40 mg daily 8/18; patient arrives today with things a lot worse than I remember from a few weeks ago. She was seen last week. Noted that her edema was worse and that she now had a left lateral wound as well as deteriorating edema in the medial and posterior part of the lower leg. She says she is using his or her external compression pumps once a day although I wonder about the compliance. 8/25; weeping area on the right medial lower leg. This had actually gotten a small localized area of her compression stocking wet. On the left side there is a large denuded area on the posterior medial lower leg and smaller area on the lateral. This was not the original areas that we dealt with. 9/1 the patient's wound on the left leg include the left lateral and left posterior. Larger superficial wounds weeping. She has very poor edema control. Tender localized edema in the left lower medial ankle/heel probably because of localized wrap issues. She freely admits she is not using [place], Evaro ([id number]) the compression pumps. She has been up on her feet a lot. She thinks the hydrofera blue is contributing to the pain she is experiencing.. This is a complaint that I have occasionally heard 9/8; really not much improvement. The patient is still complaining of a lot of pain particularly when she uses compression pumps. I switched her to silver alginate last time because she found  the Hydrofera Blue to be irritating. I don't hear much difference in her description with the silver alginate. 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 She has follow-up with Dr. Donzetta Matters I think for ultrasound next month 9/15; quite a bit of improvement today. We have less edema and more epithelialization in both of her wound areas on the left medial and left lateral calf. These are not the site of her original wounds in this area. She says she has been using her compression pumps for 30 minutes twice a day, there pain issues that never quite understood. Silver alginate as the primary dressing 9/22; continued improvement. Both areas medially and laterally still have a small open area there is some eschar. She continues to complain of left medial ankle pain. Swelling in the leg  is in much better condition. We have been using silver alginate 9/29; continued improvement. Both areas medially and laterally in the left calf look as though they are close some minor surface eschar but I think this is epithelialized. She comes in today saying she has a ruptured disc at L4-L5 cannot bend over to put on her stockings. 10/6; patient comes in today with no open wounds on either leg. However her edema on the left leg in the upper one third of the lower leg is poorly controlled nonpitting. She says that she could not use the pumps for 2 days and then she has been using the last couple of days. It is not clear to me she has been able to get her stocking on. She has back problems. Mrs. Hum has severe chronic venous insufficiency with secondary lymphedema. Her venous insufficiency is partially centrally mediated and that she is now post stent in the left common iliac vein. [facility] Thurner's syndrome/physiology]. She follows up with them on 10/15. She wears 20/30 below-knee stockings. She is supposed to use compression pumps at home although I  think her compliance about with this is been less than 100%. I have asked her to use these 3 times a day. Finally I think she has lipodermatosclerosis in the left lower leg with an inverted bottle sign. It is been a major problem controlling the edema in the left leg. The right leg we have had wounds on but not as significant a problem is on the left READMISSION 04/12/2021 Mrs. Rowen is a 75 year old woman we know well in this clinic. She has severe chronic venous insufficiency. 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 I believe she has had bilateral greater saphenous vein ablation in the past as well. She tells me that this wound opened sometime in March. She had a fall and thinks it was initially abrasion. She developed areas she describes as little blisters on the anterior part of her leg and she saw dermatology and was treated for methicillin staph aureus with several rounds of antibiotics. She has been using support stockings on the left leg and says this is the only thing she can get on. Her compression pump use maybe once a day she says if she did not use one she use the other. She comes in today with incredible swelling in the left leg with a wound on the left posterior calf. She has been using Neosporin to this previously a hydrocolloid. 6/15; patient arrives back for 1 week follow-up.Marland Kitchen Apparently her wrap fell down she did not call us to replace this. He has poor edema control. She only uses her compression pumps once a day 6/29; patient presents for 1 week follow-up. She has tolerated the compression wrap well. We have been using Iodoflex under the wrap. She has no issues or complaints today. 7/6; patient presents for 1 week follow-up. She states that the compression wrap rolled down her leg 4 days ago. She has been trying to keep the area covered but has no dressings at home to use. She denies signs of infection. 7/13; patient presents for 1  week follow-up. She states that her compression wrap rolled down her right leg and she called our office and had it placed a few days ago. She has been tolerating the current wrap well. She states that the Iodoflex is causing a burning sensation. She denies signs of infection. 7/27; patient presents for 1 week follow-up She  has tolerated the compression wrap well with Hydrofera Blue underneath. She has now developed a wound to her right lower extremity. She reports having a culture done To this area by her dermatologist that she reports is negative. She currently denies signs of infection. 8/30; patient presents for 1 week follow-up. On the left side she has tolerated the compression wrap well with Hydrofera Blue underneath. She reports some discomfort to her right lower extremity with the 3 layer compression. She currently denies signs of infection. 06/14/2021 upon evaluation today patient's wound actually appears to be doing decently well based on what I am seeing currently. She actually has 2 areas 1 on the left distal/posterior lower leg and the other on the right lower leg. Subsequently the measurements are roughly the same may be slightly smaller but in general have not made any trend towards getting worse which is great news. 06/23/2021 upon evaluation today patient appears to be doing pretty well in regard to her wounds. On the right she is having difficulty with sciatic pain and this subsequently has led to her taking the wrap off over the last week her leg is more swollen she is also been on prednisone for 10 days. With that being said I think we may want to just use a compression sock on the left that way she will not have to worry about the compression wrap being in place that she cannot get off. With that being said I do believe as well that the patient is going to need to continue with the wrapping on the left we will also need to do a little bit of sharp debridement today. 8/24; patient  presents for 1 week follow-up. She has no issues or complaints today. She denies signs of infection. 8/31; patient presents for 1 week follow-up. She has no issues or complaints today. She has tolerated the compression wrap well on the left lower extremity. She denies signs of infection. 9/7; patient presents for follow-up. She reports that the compression wrap rolled down slightly on her right lower extremity. She reports tenderness to the wound bed. She denies increased warmth or erythema to the surrounding skin. She overall feels well. 9/14; patient presents for follow-up. She has no issues or complaints today. She denies signs of infection. PuraPly is available and patient would like to start this today. 9/21; patient presents for follow-up. She has no issues or complaints today. She tolerated the first skin substitute placement last week under compression. 9/28; patient presents for 1 week follow-up. She has no issues or complaints today. ROBIE, OATS ([id number]) 10/5; patient presents for 1 week follow-up. She reports taking the wrap off yesterday. She was on her feet more yesterday and developed more swelling to her left lower extremity. She denies pain. Overall she is doing well. Patient History Information obtained from Patient. Family History Cancer - Mother,Paternal Grandparents, Hypertension - Mother,Father, Kidney Disease - Paternal Grandparents, Lung Disease - Paternal Grandparents, No family history of Diabetes, Heart Disease, Hereditary Spherocytosis, Seizures, Stroke, Thyroid Problems, Tuberculosis. Social History Former smoker - quit at age 70 - ended on 04/19/1966, Marital Status - Divorced, Alcohol Use - Never, Drug Use - No History, Caffeine Use - Daily. Medical History Eyes Patient has history of Cataracts Denies history of Glaucoma, Optic Neuritis Ear/Nose/Mouth/Throat Denies history of Chronic sinus problems/congestion, Middle ear  problems Hematologic/Lymphatic Denies history of Anemia, Hemophilia, Human Immunodeficiency Virus, Lymphedema, Sickle Cell Disease Respiratory Patient has history of Asthma, Sleep Apnea Denies history of Aspiration, Chronic  Obstructive Pulmonary Disease (COPD), Pneumothorax, Tuberculosis Cardiovascular Patient has history of Deep Vein Thrombosis - LLE 2010, Hypertension, Peripheral Venous Disease Denies history of Angina, Arrhythmia, Congestive Heart Failure, Coronary Artery Disease, Hypotension, Myocardial Infarction, Peripheral Arterial Disease, Phlebitis, Vasculitis Gastrointestinal Denies history of Cirrhosis , Colitis, Crohn s, Hepatitis A, Hepatitis B, Hepatitis C Endocrine Denies history of Type I Diabetes, Type II Diabetes Genitourinary Denies history of End Stage Renal Disease Immunological Denies history of Lupus Erythematosus, Raynaud s, Scleroderma Integumentary (Skin) Denies history of History of Burn, History of pressure wounds Musculoskeletal Patient has history of Osteoarthritis - neck Denies history of Gout, Rheumatoid Arthritis, Osteomyelitis Neurologic Denies history of Dementia, Neuropathy, Quadriplegia, Paraplegia, Seizure Disorder Oncologic Patient has history of Received Chemotherapy - interfeon immunotherapy, Received Radiation Psychiatric Denies history of Anorexia/bulimia, Confinement Anxiety Hospitalization/Surgery History - Melanoma left shoulder. Medical And Surgical History Notes Constitutional Symptoms (General Health) Vein ablation LLE 2010 Vascular Sx ( vein ablationo) LLE 02/2015 Eyes Horners syndrome Genitourinary Kidney stones Neurologic ct scan showed swollen lymph nodes Oncologic Melanoma (R) shoulder 07/2010; (R) axillary lymphnode removal Objective Constitutional Vitals Time Taken: 3:52 PM, Height: 63 in, Weight: 212 lbs, BMI: 37.6, Temperature: 98 F, Pulse: 77 bpm, Respiratory Rate: 16 breaths/min, Blood Pressure: 164/91  mmHg. MAKALEIGH, REINARD ([id number]) General Notes: Left lower extremity: To the distal medial aspect there is epithelialization to previous wound site. Minimal dried drainage noted. Leg is slightly more swollen than usual. No increased warmth erythema or pain on exam. Integumentary (Hair, Skin) Wound #13 status is Open. Original cause of wound was Gradually Appeared. The date acquired was: 02/24/2021. The wound has been in treatment 17 weeks. The wound is located on the Left,Distal,Posterior Lower Leg. The wound measures 0.1cm length x 0cm width x 0.1cm depth; 0.008cm^2 area and 0.001cm^3 volume. There is Fat Layer (Subcutaneous Tissue) exposed. There is no tunneling or undermining noted. There is a medium amount of serosanguineous drainage noted. There is large (67-100%) red, pink granulation within the wound bed. There is a small (1-33%) amount of necrotic tissue within the wound bed. Assessment Active Problems ICD-10 Chronic venous hypertension (idiopathic) with ulcer and inflammation of left lower extremity Lymphedema, not elsewhere classified Non-pressure chronic ulcer of left calf limited to breakdown of skin Patient's wound has healed. Patient would like to be wrapped 1 more week to assure that the wound does not open. I think this is reasonable. Because there is minimal dried drainage we will add Hydrofera below to the previous wound site. We will wrap her in 3 layer compression. I suspect her increased swelling is due to being on her feet more recently without the wrap on. No signs of blood clot. Procedures Wound #13 Pre-procedure diagnosis of Wound #13 is a Venous Leg Ulcer located on the Left,Distal,Posterior Lower Leg . There was a Three Layer Compression Therapy Procedure by Donnamarie Poag, RN. Post procedure Diagnosis Wound #13: Same as Pre-Procedure Plan Follow-up Appointments: Wound #13 Left,Distal,Posterior Lower Leg: Return Appointment in 1 week. Nurse Visit as  needed Bathing/ Shower/ Hygiene: May shower with wound dressing protected with water repellent cover or cast protector. No tub bath. Anesthetic (Use 'Patient Medications' Section for Anesthetic Order Entry): Lidocaine applied to wound bed Edema Control - Lymphedema / Segmental Compressive Device / Other: Optional: One layer of unna paste to top of compression wrap (to act as an anchor). Patient to wear own compression stockings. Remove compression stockings every night before going to bed and put on every morning when getting up. -  right leg Elevate, Exercise Daily and Avoid Standing for Long Periods of Time. Elevate legs to the level of the heart and pump ankles as often as possible Elevate leg(s) parallel to the floor when sitting. Compression Pump: Use compression pump on left lower extremity for 60 minutes, twice daily. - TWICE DAILY for 1 hour Compression Pump: Use compression pump on right lower extremity for 60 minutes, twice daily. - TWICE DAILY for 1 hour WOUND #13: - Lower Leg Wound Laterality: Left, Posterior, Distal Cleanser: Soap and Water 1 x Per Week/30 Days Discharge Instructions: Gently cleanse wound with antibacterial soap, rinse and pat dry prior to dressing wounds Primary Dressing: Hydrofera Blue Ready Transfer Foam, 2.5x2.5 (in/in) 1 x Per Week/30 Days Discharge Instructions: Apply Hydrofera Blue Ready to wound bed as directed Secondary Dressing: ABD Pad 5x9 (in/in) 1 x Per Week/30 Days Discharge Instructions: Cover with ABD pad ARLYNE, BRANDES. ([id number]) Compression Wrap: Profore Lite LF 3 Multilayer Compression Bandaging System 1 x Per Week/30 Days Discharge Instructions: Apply 3 multi-layer wrap as prescribed. 1. Hydrofera Blue under compression wrap 2. Follow-up in 1 week Electronic Signature(s) Signed: 08/09/2021 2:36:41 PM By: Kalman Shan DO Entered By: Kalman Shan on 08/09/2021 14:36:03 Westry, [name]  ([id number]) -------------------------------------------------------------------------------- ROS/PFSH Details Patient Name: MONNA, CREAN. Date of Service: 08/09/2021 1:45 PM Medical Record Number: [id number] Patient Account Number: [id number] Date of Birth/Sex: [birth year] (75 y.o. F) Treating RN: Donnamarie Poag Primary Care Provider: [name] Other Clinician: Referring Provider: [name] Treating Provider/Extender: Yaakov Guthrie in Treatment: 17 Label Progress Note Print Version as History and Physical for this encounter Information Obtained From Patient Constitutional Symptoms (General Health) Medical History: Past Medical History Notes: Vein ablation LLE 2010 Vascular Sx ( vein ablationo) LLE 02/2015 Eyes Medical History: Positive for: Cataracts Negative for: Glaucoma; Optic Neuritis Past Medical History Notes: Horners syndrome Ear/Nose/Mouth/Throat Medical History: Negative for: Chronic sinus problems/congestion; Middle ear problems Hematologic/Lymphatic Medical History: Negative for: Anemia; Hemophilia; Human Immunodeficiency Virus; Lymphedema; Sickle Cell Disease Respiratory Medical History: Positive for: Asthma; Sleep Apnea Negative for: Aspiration; Chronic Obstructive Pulmonary Disease (COPD); Pneumothorax; Tuberculosis Cardiovascular Medical History: Positive for: Deep Vein Thrombosis - LLE 2010; Hypertension; Peripheral Venous Disease Negative for: Angina; Arrhythmia; Congestive Heart Failure; Coronary Artery Disease; Hypotension; Myocardial Infarction; Peripheral Arterial Disease; Phlebitis; Vasculitis Gastrointestinal Medical History: Negative for: Cirrhosis ; Colitis; Crohnos; Hepatitis A; Hepatitis B; Hepatitis C Endocrine Medical History: Negative for: Type I Diabetes; Type II Diabetes Genitourinary Medical History: Negative for: End Stage Renal Disease Past Medical History Notes: Kidney stones KIRSTY, MONJARAZ  ([id number]) Immunological Medical History: Negative for: Lupus Erythematosus; Raynaudos; Scleroderma Integumentary (Skin) Medical History: Negative for: History of Burn; History of pressure wounds Musculoskeletal Medical History: Positive for: Osteoarthritis - neck Negative for: Gout; Rheumatoid Arthritis; Osteomyelitis Neurologic Medical History: Negative for: Dementia; Neuropathy; Quadriplegia; Paraplegia; Seizure Disorder Past Medical History Notes: ct scan showed swollen lymph nodes Oncologic Medical History: Positive for: Received Chemotherapy - interfeon immunotherapy; Received Radiation Past Medical History Notes: Melanoma (R) shoulder 07/2010; (R) axillary lymphnode removal Psychiatric Medical History: Negative for: Anorexia/bulimia; Confinement Anxiety HBO Extended History Items Eyes: Cataracts Immunizations Pneumococcal Vaccine: Received Pneumococcal Vaccination: Yes Received Pneumococcal Vaccination On or After 60th Birthday: No Immunization Notes: tetanus shot w/in the last 5 years per pt Implantable Devices None Hospitalization / Surgery History Type of Hospitalization/Surgery Melanoma left shoulder Family and Social History Cancer: Yes - Mother,Paternal Grandparents; Diabetes: No; Heart Disease: No; Hereditary Spherocytosis: No; Hypertension: Yes - Mother,Father; Kidney Disease: Yes -  Paternal Grandparents; Lung Disease: Yes - Paternal Grandparents; Seizures: No; Stroke: No; Thyroid Problems: No; Tuberculosis: No; Former smoker - quit at age 83 - ended on 04/19/1966; Marital Status - Divorced; Alcohol Use: Never; Drug Use: No History; Caffeine Use: Daily; Financial Concerns: No; Food, Clothing or Shelter Needs: No; Support System Lacking: No; Transportation Concerns: No Electronic Signature(s) Signed: 08/09/2021 2:36:41 PM By: Kalman Shan DO Signed: 08/09/2021 3:06:25 PM By: Donnamarie Poag Entered By: Kalman Shan on 08/09/2021 14:32:23 [facility], [name] ([id number]) -------------------------------------------------------------------------------- SuperBill Details Patient Name: KYRIANNA, BARLETTA. Date of Service: 08/09/2021 Medical Record Number: [id number] Patient Account Number: [id number] Date of Birth/Sex: 08-07-1946 (75 y.o. F) Treating RN: Donnamarie Poag Primary Care Provider: [name] Other Clinician: Referring Provider: [name] Treating Provider/Extender: Yaakov Guthrie in Treatment: 17 Diagnosis Coding ICD-10 Codes Code Description [phone] Chronic venous hypertension (idiopathic) with ulcer and inflammation of left lower extremity I89.0 Lymphedema, not elsewhere classified L97.221 Non-pressure chronic ulcer of left calf limited to breakdown of skin Physician Procedures CPT4 Code Description: [id number] - WC PHYS LEVEL 3 - EST PT Modifier: Quantity: 1 CPT4 Code Description: ICD-10 Diagnosis Description I87.332 Chronic venous hypertension (idiopathic) with ulcer and inflammation of I89.0 Lymphedema, not elsewhere classified L97.221 Non-pressure chronic ulcer of left calf limited to breakdown of skin Modifier: left lower extremity Quantity: Electronic Signature(s) Signed: 08/09/2021 2:36:41 PM By: Kalman Shan DO Entered By: Kalman Shan on 08/09/2021 14:36:19

## 2021-08-09 NOTE — Progress Notes (Signed)
[name] ([id number]) Visit Report for 08/09/2021 Arrival Information Details Patient Name: Amber, [name]. Date of Service: 08/09/2021 1:45 PM Medical Record Number: [id number] Patient Account Number: [id number] Date of Birth/Sex: 1945/12/15 (75 y.o. F) Treating RN: Donnamarie Poag Primary Care Kallen Delatorre: [name] Other Clinician: Referring Verdis Bassette: [name] Treating Abeni Finchum/Extender: Yaakov Guthrie in Treatment: 49 Visit Information History Since Last Visit Added or deleted any medications: No Patient Arrived: Ambulatory Had a fall or experienced change in No Arrival Time: 13:48 activities of daily living that may affect Accompanied By: self risk of falls: Transfer Assistance: None Hospitalized since last visit: No Patient Identification Verified: Yes Has Dressing in Place as Prescribed: Yes Secondary Verification Process Completed: Yes Has Compression in Place as Prescribed: Yes Patient Requires Transmission-Based No Pain Present Now: No Precautions: Patient Has Alerts: Yes Patient Alerts: Patient on Blood Thinner ASPIRIN NOT diabetic Electronic Signature(s) Signed: 08/09/2021 3:06:25 PM By: Donnamarie Poag Entered By: Donnamarie Poag on 08/09/2021 13:51:13 [name] ([id number]) -------------------------------------------------------------------------------- Compression Therapy Details Patient Name: Amber, [name]. Date of Service: 08/09/2021 1:45 PM Medical Record Number: [id number] Patient Account Number: [id number] Date of Birth/Sex: June 07, 1946 (75 y.o. F) Treating RN: Donnamarie Poag Primary Care Raysean Graumann: [name] Other Clinician: Referring Makynleigh Breslin: [name] Treating Seung Nidiffer/Extender: Yaakov Guthrie in Treatment: 17 Compression Therapy Performed for Wound Assessment: Wound #13 Left,Distal,Posterior Lower Leg Performed By: Junius Argyle, RN Compression Type: Three Layer Post Procedure Diagnosis Same as  Pre-procedure Electronic Signature(s) Signed: 08/09/2021 3:06:25 PM By: Donnamarie Poag Entered By: Donnamarie Poag on 08/09/2021 14:26:33 [name] ([id number]) -------------------------------------------------------------------------------- Encounter Discharge Information Details Patient Name: Amber, [name]. Date of Service: 08/09/2021 1:45 PM Medical Record Number: [id number] Patient Account Number: [id number] Date of Birth/Sex: 1946-09-17 (75 y.o. F) Treating RN: Donnamarie Poag Primary Care Cleon Signorelli: [name] Other Clinician: Referring Bharat Antillon: [name] Treating Ainslie Mazurek/Extender: Yaakov Guthrie in Treatment: 17 Encounter Discharge Information Items Discharge Condition: Stable Ambulatory Status: Ambulatory Discharge Destination: Home Transportation: Private Auto Accompanied By: self Schedule Follow-up Appointment: Yes Clinical Summary of Care: Electronic Signature(s) Signed: 08/09/2021 3:06:25 PM By: Donnamarie Poag Entered By: Donnamarie Poag on 08/09/2021 14:29:40 [name] ([id number]) -------------------------------------------------------------------------------- Lower Extremity Assessment Details Patient Name: Amber, [name]. Date of Service: 08/09/2021 1:45 PM Medical Record Number: [id number] Patient Account Number: [id number] Date of Birth/Sex: [birth year] (75 y.o. F) Treating RN: Donnamarie Poag Primary Care Estie Sproule: [name] Other Clinician: Referring Nahomi Hegner: [name] Treating Pearl Bents/Extender: Yaakov Guthrie in Treatment: 17 Edema Assessment Assessed: [name]: Yes] Patrice Paradise: No] Edema: [Left: Ye] [Right: s] Calf Left: Right: Point of Measurement: 32 cm From Medial Instep 48 cm Ankle Left: Right: Point of Measurement: 10 cm From Medial Instep 21.5 cm Knee To Floor Left: Right: From Medial Instep 44 cm Vascular Assessment Pulses: Dorsalis Pedis Palpable: [Left:Yes] Electronic Signature(s) Signed: 08/09/2021 3:06:25  PM By: Donnamarie Poag Entered By: Donnamarie Poag on 08/09/2021 13:58:58 [name], [name] ([id number]) -------------------------------------------------------------------------------- Multi Wound Chart Details Patient Name: Amber, [name]. Date of Service: 08/09/2021 1:45 PM Medical Record Number: [id number] Patient Account Number: [id number] Date of Birth/Sex: [birth year] (75 y.o. F) Treating RN: Donnamarie Poag Primary Care Cache Bills: [name] Other Clinician: Referring Dmiyah Liscano: [name] Treating Yurem Viner/Extender: Yaakov Guthrie in Treatment: 17 Vital Signs Height(in): 63 Pulse(bpm): 57 Weight(lbs): 212 Blood Pressure(mmHg): 164/91 Body Mass Index(BMI): 38 Temperature(F): 98 Respiratory Rate(breaths/min): 16 Photos: [N/A:N/A] Wound Location: Left, Distal, Posterior Lower Leg N/A N/A Wounding Event: Gradually Appeared N/A N/A Primary Etiology: Venous Leg Ulcer  N/A N/A Comorbid History: Cataracts, Asthma, Sleep Apnea, N/A N/A Deep Vein Thrombosis, Hypertension, Peripheral Venous Disease, Osteoarthritis, Received Chemotherapy, Received Radiation Date Acquired: 02/24/2021 N/A N/A Weeks of Treatment: 17 N/A N/A Wound Status: Open N/A N/A Measurements L x W x D (cm) 0.1x0x0.1 N/A N/A Area (cm) : 0.008 N/A N/A Volume (cm) : 0.001 N/A N/A % Reduction in Area: 99.90% N/A N/A % Reduction in Volume: 99.80% N/A N/A Classification: Full Thickness Without Exposed N/A N/A Support Structures Exudate Amount: Medium N/A N/A Exudate Type: Serosanguineous N/A N/A Exudate Color: red, brown N/A N/A Granulation Amount: Large (67-100%) N/A N/A Granulation Quality: Red, Pink N/A N/A Necrotic Amount: Small (1-33%) N/A N/A Exposed Structures: Fat Layer (Subcutaneous Tissue): N/A N/A Yes Fascia: No Tendon: No Muscle: No Joint: No Bone: No Epithelialization: Small (1-33%) N/A N/A Procedures Performed: Compression Therapy N/A N/A Treatment Notes Wound #13 (Lower Leg) Wound  Laterality: Left, Posterior, Distal Cleanser Soap and Water Discharge Instruction: Gently cleanse wound with antibacterial soap, rinse and pat dry prior to dressing wounds [name], [name] ([id number]) Peri-Wound Care Topical Primary Dressing Hydrofera Blue Ready Transfer Foam, 2.5x2.5 (in/in) Discharge Instruction: Apply Hydrofera Blue Ready to wound bed as directed Secondary Dressing ABD Pad 5x9 (in/in) Discharge Instruction: Cover with ABD pad Secured With Compression Wrap Profore Lite LF 3 Multilayer Compression Bandaging System Discharge Instruction: Apply 3 multi-layer wrap as prescribed. Compression Stockings Add-Ons Electronic Signature(s) Signed: 08/09/2021 2:36:41 PM By: Kalman Shan DO Entered By: Kalman Shan on 08/09/2021 14:30:39 Xie, Tenna [name] ([id number]) -------------------------------------------------------------------------------- Multi-Disciplinary Care Plan Details Patient Name: Amber, [name]. Date of Service: 08/09/2021 1:45 PM Medical Record Number: [id number] Patient Account Number: [id number] Date of Birth/Sex: 1946-03-01 (75 y.o. F) Treating RN: Donnamarie Poag Primary Care Shylyn Younce: [name] Other Clinician: Referring Thyra Yinger: [name] Treating Pattie Flaharty/Extender: Yaakov Guthrie in Treatment: 17 Active Inactive Electronic Signature(s) Signed: 08/09/2021 3:06:25 PM By: Donnamarie Poag Entered By: Donnamarie Poag on 08/09/2021 13:59:52 Barua, Kassia Lenna Sciara ([id number]) -------------------------------------------------------------------------------- Pain Assessment Details Patient Name: Amber, [name]. Date of Service: 08/09/2021 1:45 PM Medical Record Number: [id number] Patient Account Number: [id number] Date of Birth/Sex: [birth year] (75 y.o. F) Treating RN: Donnamarie Poag Primary Care Velta Rockholt: [name] Other Clinician: Referring Islam Eichinger: [name] Treating Lexine Jaspers/Extender: Yaakov Guthrie in Treatment:  17 Active Problems Location of Pain Severity and Description of Pain Patient Has Paino No Site Locations Rate the pain. Current Pain Level: 0 Pain Management and Medication Current Pain Management: Electronic Signature(s) Signed: 08/09/2021 3:06:25 PM By: Donnamarie Poag Entered By: Donnamarie Poag on 08/09/2021 13:54:05 Dowty, Tenna [name] ([id number]) -------------------------------------------------------------------------------- Patient/Caregiver Education Details Patient Name: Amber, [name]. Date of Service: 08/09/2021 1:45 PM Medical Record Number: [id number] Patient Account Number: [id number] Date of Birth/Gender: [birth year] (75 y.o. F) Treating RN: Donnamarie Poag Primary Care Physician: [name] Other Clinician: Referring Physician: [name] Treating Physician/Extender: Yaakov Guthrie in Treatment: 17 Education Assessment Education Provided To: Patient Education Topics Provided Venous: Wound/Skin Impairment: Electronic Signature(s) Signed: 08/09/2021 3:06:25 PM By: Donnamarie Poag Entered By: Donnamarie Poag on 08/09/2021 14:25:58 Petrizzo, Tenna [name] ([id number]) -------------------------------------------------------------------------------- Wound Assessment Details Patient Name: Amber, [name]. Date of Service: 08/09/2021 1:45 PM Medical Record Number: [id number] Patient Account Number: [id number] Date of Birth/Sex: 13-Nov-1945 (75 y.o. F) Treating RN: Donnamarie Poag Primary Care Nicey Krah: [name] Other Clinician: Referring Jannice Beitzel: [name] Treating Johan Creveling/Extender: Yaakov Guthrie in Treatment: 17 Wound Status Wound Number: 13 Primary Venous Leg Ulcer Etiology: Wound Location: Left, Distal, Posterior Lower Leg Wound Open Wounding  Event: Gradually Appeared Status: Date Acquired: 02/24/2021 Comorbid Cataracts, Asthma, Sleep Apnea, Deep Vein Thrombosis, Weeks Of Treatment: 17 History: Hypertension, Peripheral Venous Disease,  Osteoarthritis, Clustered Wound: No Received Chemotherapy, Received Radiation Photos Wound Measurements Length: (cm) 0.1 Width: (cm) 0 Depth: (cm) 0.1 Area: (cm) 0.008 Volume: (cm) 0.001 % Reduction in Area: 99.9% % Reduction in Volume: 99.8% Epithelialization: Small (1-33%) Tunneling: No Undermining: No Wound Description Classification: Full Thickness Without Exposed Support Structures Exudate Amount: Medium Exudate Type: Serosanguineous Exudate Color: red, brown Foul Odor After Cleansing: No Slough/Fibrino Yes Wound Bed Granulation Amount: Large (67-100%) Exposed Structure Granulation Quality: Red, Pink Fascia Exposed: No Necrotic Amount: Small (1-33%) Fat Layer (Subcutaneous Tissue) Exposed: Yes Tendon Exposed: No Muscle Exposed: No Joint Exposed: No Bone Exposed: No Treatment Notes Wound #13 (Lower Leg) Wound Laterality: Left, Posterior, Distal Cleanser Soap and Water Discharge Instruction: Gently cleanse wound with antibacterial soap, rinse and pat dry prior to dressing wounds Peri-Wound Care Amber, [name] ([id number]) Topical Primary Dressing Hydrofera Blue Ready Transfer Foam, 2.5x2.5 (in/in) Discharge Instruction: Apply Hydrofera Blue Ready to wound bed as directed Secondary Dressing ABD Pad 5x9 (in/in) Discharge Instruction: Cover with ABD pad Secured With Compression Wrap Profore Lite LF 3 Multilayer Compression Bandaging System Discharge Instruction: Apply 3 multi-layer wrap as prescribed. Compression Stockings Add-Ons Electronic Signature(s) Signed: 08/09/2021 3:06:25 PM By: Donnamarie Poag Entered By: Donnamarie Poag on 08/09/2021 14:28:14 Breck, Tenna [name] ([id number]) -------------------------------------------------------------------------------- Vitals Details Patient Name: Amber, [name]. Date of Service: 08/09/2021 1:45 PM Medical Record Number: [id number] Patient Account Number: [id number] Date of Birth/Sex: [birth year] (75 y.o. F) Treating RN:  Donnamarie Poag Primary Care Rhiann Boucher: [name] Other Clinician: Referring Delylah Stanczyk: [name] Treating Aeisha Minarik/Extender: Yaakov Guthrie in Treatment: 17 Vital Signs Time Taken: 15:52 Temperature (F): 98 Height (in): 63 Pulse (bpm): 77 Weight (lbs): 212 Respiratory Rate (breaths/min): 16 Body Mass Index (BMI): 37.6 Blood Pressure (mmHg): 164/91 Reference Range: 80 - 120 mg / dl Electronic Signature(s) Signed: 08/09/2021 3:06:25 PM By: Donnamarie Poag Entered ByDonnamarie Poag on 08/09/2021 13:53:43

## 2021-08-10 NOTE — Telephone Encounter (Signed)
Returned pt's call.  Lvm asking pt to call back.  Need to see what she needs before scheduling labs (@ BS) and cpe (@ GO) after 10/13/21.

## 2021-08-11 NOTE — Telephone Encounter (Signed)
Returned pt's call.  Lvm asking pt to call back.  Need to see what she needs before scheduling labs (@ BS) and cpe (@ GO) after 10/13/21.  Mailing a letter.

## 2021-08-14 ENCOUNTER — Telehealth: Payer: Self-pay | Admitting: *Deleted

## 2021-08-14 NOTE — Telephone Encounter (Signed)
Reschedule appointment from 05/09/21

## 2021-08-15 ENCOUNTER — Other Ambulatory Visit: Payer: Self-pay

## 2021-08-15 ENCOUNTER — Inpatient Hospital Stay: Payer: Medicare Other | Attending: Hematology & Oncology

## 2021-08-15 ENCOUNTER — Encounter: Payer: Self-pay | Admitting: Family

## 2021-08-15 ENCOUNTER — Other Ambulatory Visit: Payer: Self-pay | Admitting: Family

## 2021-08-15 ENCOUNTER — Inpatient Hospital Stay (HOSPITAL_BASED_OUTPATIENT_CLINIC_OR_DEPARTMENT_OTHER): Payer: Medicare Other | Admitting: Family

## 2021-08-15 VITALS — BP 149/76 | HR 72 | Temp 98.6°F | Resp 18 | Wt 208.1 lb

## 2021-08-15 DIAGNOSIS — R5383 Other fatigue: Secondary | ICD-10-CM | POA: Diagnosis not present

## 2021-08-15 DIAGNOSIS — M545 Low back pain, unspecified: Secondary | ICD-10-CM | POA: Diagnosis not present

## 2021-08-15 DIAGNOSIS — G5600 Carpal tunnel syndrome, unspecified upper limb: Secondary | ICD-10-CM | POA: Diagnosis not present

## 2021-08-15 DIAGNOSIS — C4361 Malignant melanoma of right upper limb, including shoulder: Secondary | ICD-10-CM | POA: Insufficient documentation

## 2021-08-15 DIAGNOSIS — Z79899 Other long term (current) drug therapy: Secondary | ICD-10-CM | POA: Diagnosis not present

## 2021-08-15 LAB — LACTATE DEHYDROGENASE: LDH: 213 U/L — ABNORMAL HIGH (ref 98–192)

## 2021-08-15 LAB — CBC WITH DIFFERENTIAL (CANCER CENTER ONLY)
Abs Immature Granulocytes: 0.02 10*3/uL (ref 0.00–0.07)
Basophils Absolute: 0 10*3/uL (ref 0.0–0.1)
Basophils Relative: 0 %
Eosinophils Absolute: 0.3 10*3/uL (ref 0.0–0.5)
Eosinophils Relative: 4 %
HCT: 37.9 % (ref 36.0–46.0)
Hemoglobin: 12.5 g/dL (ref 12.0–15.0)
Immature Granulocytes: 0 %
Lymphocytes Relative: 26 %
Lymphs Abs: 1.9 10*3/uL (ref 0.7–4.0)
MCH: 30.4 pg (ref 26.0–34.0)
MCHC: 33 g/dL (ref 30.0–36.0)
MCV: 92.2 fL (ref 80.0–100.0)
Monocytes Absolute: 0.6 10*3/uL (ref 0.1–1.0)
Monocytes Relative: 8 %
Neutro Abs: 4.3 10*3/uL (ref 1.7–7.7)
Neutrophils Relative %: 62 %
Platelet Count: 179 10*3/uL (ref 150–400)
RBC: 4.11 MIL/uL (ref 3.87–5.11)
RDW: 13.6 % (ref 11.5–15.5)
WBC Count: 7.1 10*3/uL (ref 4.0–10.5)
nRBC: 0 % (ref 0.0–0.2)

## 2021-08-15 LAB — CMP (CANCER CENTER ONLY)
ALT: 16 U/L (ref 0–44)
AST: 12 U/L — ABNORMAL LOW (ref 15–41)
Albumin: 3.9 g/dL (ref 3.5–5.0)
Alkaline Phosphatase: 48 U/L (ref 38–126)
Anion gap: 6 (ref 5–15)
BUN: 16 mg/dL (ref 8–23)
CO2: 31 mmol/L (ref 22–32)
Calcium: 10 mg/dL (ref 8.9–10.3)
Chloride: 105 mmol/L (ref 98–111)
Creatinine: 0.59 mg/dL (ref 0.44–1.00)
GFR, Estimated: >60 mL/min (ref >60)
Glucose, Bld: 100 mg/dL — ABNORMAL HIGH (ref 70–99)
Potassium: 4 mmol/L (ref 3.5–5.1)
Sodium: 142 mmol/L (ref 135–145)
Total Bilirubin: 0.6 mg/dL (ref 0.3–1.2)
Total Protein: 6.5 g/dL (ref 6.5–8.1)

## 2021-08-15 NOTE — Progress Notes (Signed)
Hematology and Oncology Follow Up Visit  ELISHEBA MCDONNELL [id number] May 30, 1946 75 y.o. 08/15/2021   Principle Diagnosis:  Stage IIIC (T3b N3 M0) melanoma of the right shoulder   Current Therapy:        Observation   Interim History:  Ms. Hancher is here today for follow-up. We last saw her in July 2021. She is staying quite busy with all of her sweet cats.  She notes brain fog and not remembering what she is taking about while in conversation. She is fatigued and notes that her balance is off. She is veering to the right when walking. She is unsure of this has to do with her cervical spine issues. She has had pulling to the left side in the past noted in 2018 assessed by neurosurgery Dr. Ellene Route.  She has pain that effects the mid and lower back. This waxes and wanes. She has been treated by Dr. Koleen Distance in the past and plans to contact his office for follow-up.  She states that since having treatment for the melanoma back in 2012 she notes that she has some left sided facial drooping.  No skin changes noted.  No adenopathy noted on exam.  No fever, chills, n/v, cough, rash, dizziness, SOB, chest pain, palpitations, abdominal pain or changes in bowel or bladder habits.  No numbness or tingling in her extremities at this time. She does have intermittent numbness and tingling in her hands associated with carpal tunnel syndrome.  She had a fall outside on a wooden walkway which she states is not secure. She states that she was not injured. No syncope.  She has a venous stasis ulcer on the back of her left leg that she states is almost healed. She is followed closely by a wound care clinic.  She has maintained a good appetite and is staying properly hydrated. Her weight is stable at 208 lbs.   ECOG Performance Status: 1 - Symptomatic but completely ambulatory  Medications:  Allergies as of 08/15/2021       Reactions   Sulfa Antibiotics Itching, Swelling, Shortness Of Breath   Facial  swelling   Voltaren [diclofenac Sodium] Shortness Of Breath   Latex Other (See Comments)   Blisters ONLY HAD REACTION TO TAPE  (??? ONLY ADHESIVE ALLERGY)   Tape Other (See Comments)   Caused blisters - must use paper tape - same reaction from NeoPrene   Doxycycline Other (See Comments)   Diaphoresis and dizziness   Other    Neoprene        Medication List        Accurate as of August 15, 2021  2:33 PM. If you have any questions, ask your nurse or doctor.          acetaminophen 650 MG CR tablet Commonly known as: TYLENOL Take 2 tablets (1,300 mg total) by mouth 2 (two) times daily as needed for pain.   albuterol 108 (90 Base) MCG/ACT inhaler Commonly known as: VENTOLIN HFA INHALE 2 PUFFS BY MOUTH EVERY 6 HORUS AS NEEDED FOR WHEEZING/SHORTNESS OF BREATH   aspirin EC 81 MG tablet Take 81 mg by mouth at bedtime. Melanoma prevention   furosemide 40 MG tablet Commonly known as: LASIX TAKE 1 TABLET (40 MG TOTAL) BY MOUTH 2 (TWO) TIMES DAILY AS NEEDED FOR FLUID. TAKE SECOND DOSE IF NEEDED FOR LEG SWELLING   Klor-Con M20 20 MEQ tablet Generic drug: potassium chloride SA TAKE 1 TABLET BY MOUTH TWICE A DAY   loratadine 10  MG tablet Commonly known as: CLARITIN Take 10 mg by mouth daily.   losartan 25 MG tablet Commonly known as: COZAAR TAKE 2 TABLETS BY MOUTH EVERY DAY   meloxicam 15 MG tablet Commonly known as: MOBIC TAKE 1 TABLET BY MOUTH EVERY DAY AS NEEDED FOR PAIN   methocarbamol 500 MG tablet Commonly known as: Robaxin Take 1 tablet (500 mg total) by mouth 3 (three) times daily as needed for muscle spasms (sedation precautions).   montelukast 10 MG tablet Commonly known as: SINGULAIR TAKE 1 TABLET BY MOUTH EVERY DAY   multivitamin with minerals Tabs tablet Take 1 tablet by mouth daily.   oxyCODONE 5 MG immediate release tablet Commonly known as: Roxicodone Take 1 tablet (5 mg total) by mouth 3 (three) times daily as needed for severe pain.    PRESCRIPTION MEDICATION CPAP   PROBIOTIC DAILY PO Take 1 tablet by mouth daily.   TEARS PLUS OP Place 1 drop into both eyes daily as needed (dry eyes/ redness/ burning).   triamcinolone 55 MCG/ACT Aero nasal inhaler Commonly known as: NASACORT Place 2 sprays into the nose daily. In each nostril   triamcinolone cream 0.1 % Commonly known as: KENALOG Apply 1 application topically 2 (two) times daily. Apply to AA. Max 2 weeks at a time.   UNABLE TO FIND Take by mouth.   Vitamin D (Ergocalciferol) 1.25 MG (50000 UNIT) Caps capsule Commonly known as: DRISDOL TAKE 1 CAPSULE BY MOUTH EVERY 7 DAYS        Allergies:  Allergies  Allergen Reactions   Sulfa Antibiotics Itching, Swelling and Shortness Of Breath    Facial swelling   Voltaren [Diclofenac Sodium] Shortness Of Breath   Latex Other (See Comments)    Blisters ONLY HAD REACTION TO TAPE  (??? ONLY ADHESIVE ALLERGY)   Tape Other (See Comments)    Caused blisters - must use paper tape - same reaction from NeoPrene   Doxycycline Other (See Comments)    Diaphoresis and dizziness   Other     Neoprene    Past Medical History, Surgical history, Social history, and Family History were reviewed and updated.  Review of Systems: All other 10 point review of systems is negative.   Physical Exam:  weight is 208 lb 1.9 oz (94.4 kg). Her oral temperature is 98.6 F (37 C). Her blood pressure is 149/76 (abnormal) and her pulse is 72. Her respiration is 18 and oxygen saturation is 97%.   Wt Readings from Last 3 Encounters:  08/15/21 208 lb 1.9 oz (94.4 kg)  08/02/21 203 lb (92.1 kg)  07/05/21 202 lb (91.6 kg)    Ocular: Sclerae unicteric, pupils equal, round and reactive to light Ear-nose-throat: Oropharynx clear, dentition fair Lymphatic: No cervical, supraclavicular or axillary adenopathy Lungs no rales or rhonchi, good excursion bilaterally Heart regular rate and rhythm, no murmur appreciated Abd soft, nontender,  positive bowel sounds MSK no focal spinal tenderness, no joint edema Neuro: non-focal, well-oriented, appropriate affect Breasts: Deferred   Lab Results  Component Value Date   WBC 7.1 08/15/2021   HGB 12.5 08/15/2021   HCT 37.9 08/15/2021   MCV 92.2 08/15/2021   PLT 179 08/15/2021   Lab Results  Component Value Date   FERRITIN 51.2 10/12/2020   IRON 59 10/12/2020   TIBC 280 08/21/2013   UIBC 207 08/21/2013   IRONPCTSAT 16.7 (L) 10/12/2020   Lab Results  Component Value Date   RBC 4.11 08/15/2021   No results found for: KPAFRELGTCHN, LAMBDASER,  KAPLAMBRATIO No results found for: IGGSERUM, IGA, IGMSERUM No results found for: Odetta Pink, SPEI   Chemistry      Component Value Date/Time   NA 143 01/24/2021 1240   NA 149 (H) 08/15/2017 1039   NA 143 09/17/2016 1018   K 4.1 01/24/2021 1240   K 4.1 08/15/2017 1039   K 3.5 09/17/2016 1018   CL 107 01/24/2021 1240   CL 107 08/15/2017 1039   CO2 31 01/24/2021 1240   CO2 29 08/15/2017 1039   CO2 28 09/17/2016 1018   BUN 15 01/24/2021 1240   BUN 14 08/15/2017 1039   BUN 19.5 09/17/2016 1018   CREATININE 0.67 01/24/2021 1240   CREATININE 0.77 05/05/2020 1115   CREATININE 1.0 08/15/2017 1039   CREATININE 0.8 09/17/2016 1018      Component Value Date/Time   CALCIUM 9.4 01/24/2021 1240   CALCIUM 9.7 08/15/2017 1039   CALCIUM 9.8 09/17/2016 1018   ALKPHOS 60 05/05/2020 1115   ALKPHOS 48 08/15/2017 1039   ALKPHOS 71 09/17/2016 1018   AST 11 (L) 05/05/2020 1115   AST 10 09/17/2016 1018   ALT 16 05/05/2020 1115   ALT 43 08/15/2017 1039   ALT 18 09/17/2016 1018   BILITOT 0.5 05/05/2020 1115   BILITOT 0.39 09/17/2016 1018       Impression and Plan: Ms. Calbert is a very pleasant 75 yo caucasian female with history of stage IIIc melanoma if the right shoulder with resection. She had 8 positive lymph nodes and completed adjuvant interferon therapy in December 2012   followed by radiation to the right axilla.  Patient is symptomatic as mentioned above also discussed with Dr. Marin Olp.  We will get an MRI of the brain to further assess for cause of symptoms.  We also advised that she follow-up with her neurologist.  We will plan to follow-up in 1 year but this is subject to change pending MRI results.  She was encouraged to contact our office with any questions or concerns. We can certainly see her sooner if needed.   Lottie Dawson, NP 10/11/20222:33 PM

## 2021-08-16 ENCOUNTER — Encounter: Payer: Medicare Other | Admitting: Internal Medicine

## 2021-08-16 ENCOUNTER — Telehealth: Payer: Self-pay | Admitting: *Deleted

## 2021-08-16 DIAGNOSIS — I87332 Chronic venous hypertension (idiopathic) with ulcer and inflammation of left lower extremity: Secondary | ICD-10-CM | POA: Diagnosis not present

## 2021-08-16 NOTE — Telephone Encounter (Signed)
Per 08/15/21 los called and lvm of upcoming message - requested phone call to confirm

## 2021-08-16 NOTE — Progress Notes (Signed)
Amber, [name] ([id number]) Visit Report for 08/16/2021 Arrival Information Details Patient Name: [name], [name]. Date of Service: 08/16/2021 1:45 PM Medical Record Number: [id number] Patient Account Number: [id number] Date of Birth/Sex: [birth year] (75 y.o. F) Treating RN: Donnamarie Poag Primary Care Allister Lessley: [name] Other Clinician: Referring Jona Erkkila: [name] Treating Alexio Sroka/Extender: Yaakov Guthrie in Treatment: 18 Visit Information History Since Last Visit Added or deleted any medications: No Patient Arrived: Ambulatory Had a fall or experienced change in No Arrival Time: 14:06 activities of daily living that may affect Accompanied By: self risk of falls: Transfer Assistance: None Hospitalized since last visit: No Patient Identification Verified: Yes Has Dressing in Place as Prescribed: Yes Secondary Verification Process Completed: Yes Has Compression in Place as Prescribed: Yes Patient Requires Transmission-Based No Pain Present Now: No Precautions: Patient Has Alerts: Yes Patient Alerts: Patient on Blood Thinner ASPIRIN NOT diabetic Electronic Signature(s) Signed: 08/16/2021 3:03:55 PM By: Donnamarie Poag Entered By: Donnamarie Poag on 08/16/2021 14:06:44 [name] ([id number]) -------------------------------------------------------------------------------- Clinic Level of Care Assessment Details Patient Name: [name], [name]. Date of Service: 08/16/2021 1:45 PM Medical Record Number: [id number] Patient Account Number: [id number] Date of Birth/Sex: [birth year] (76 y.o. F) Treating RN: Donnamarie Poag Primary Care Fenix Rorke: [name] Other Clinician: Referring Dameshia Seybold: [name] Treating Brandace Cargle/Extender: Yaakov Guthrie in Treatment: 18 Clinic Level of Care Assessment Items TOOL 1 Quantity Score []  - Use when EandM and Procedure is performed on INITIAL visit 0 ASSESSMENTS - Nursing Assessment / Reassessment []  -  General Physical Exam (combine w/ comprehensive assessment (listed just below) when performed on new 0 pt. evals) []  - 0 Comprehensive Assessment (HX, ROS, Risk Assessments, Wounds Hx, etc.) ASSESSMENTS - Wound and Skin Assessment / Reassessment []  - Dermatologic / Skin Assessment (not related to wound area) 0 ASSESSMENTS - Ostomy and/or Continence Assessment and Care []  - Incontinence Assessment and Management 0 []  - 0 Ostomy Care Assessment and Management (repouching, etc.) PROCESS - Coordination of Care []  - Simple Patient / Family Education for ongoing care 0 []  - 0 Complex (extensive) Patient / Family Education for ongoing care []  - 0 Staff obtains Programmer, systems, Records, Test Results / Process Orders []  - 0 Staff telephones HHA, Nursing Homes / Clarify orders / etc []  - 0 Routine Transfer to another Facility (non-emergent condition) []  - 0 Routine Hospital Admission (non-emergent condition) []  - 0 New Admissions / Biomedical engineer / Ordering NPWT, Apligraf, etc. []  - 0 Emergency Hospital Admission (emergent condition) PROCESS - Special Needs []  - Pediatric / Minor Patient Management 0 []  - 0 Isolation Patient Management []  - 0 Hearing / Language / Visual special needs []  - 0 Assessment of Community assistance (transportation, D/C planning, etc.) []  - 0 Additional assistance / Altered mentation []  - 0 Support Surface(s) Assessment (bed, cushion, seat, etc.) INTERVENTIONS - Miscellaneous []  - External ear exam 0 []  - 0 Patient Transfer (multiple staff / Civil Service fast streamer / Similar devices) []  - 0 Simple Staple / Suture removal (25 or less) []  - 0 Complex Staple / Suture removal (26 or more) []  - 0 Hypo/Hyperglycemic Management (do not check if billed separately) []  - 0 Ankle / Brachial Index (ABI) - do not check if billed separately Has the patient been seen at the hospital within the last three years: Yes Total Score: 0 Level Of Care: ____ [name]  ([id number]) Electronic Signature(s) Signed: 08/16/2021 3:03:55 PM By: Donnamarie Poag Entered By: Donnamarie Poag on 08/16/2021 14:30:32 [name], [name] ([id number]) --------------------------------------------------------------------------------  Compression Therapy Details Patient Name: [name], [name]. Date of Service: 08/16/2021 1:45 PM Medical Record Number: [id number] Patient Account Number: [id number] Date of Birth/Sex: 1946/06/15 (75 y.o. F) Treating RN: Donnamarie Poag Primary Care Bera Pinela: [name] Other Clinician: Referring [name] [name]: [name] Treating Jaire Pinkham/Extender: Yaakov Guthrie in Treatment: 18 Compression Therapy Performed for Wound Assessment: NonWound Condition Lymphedema - Left Leg Performed By: Clinician Donnamarie Poag, RN Compression Type: Three Layer Post Procedure Diagnosis Same as Pre-procedure Electronic Signature(s) Signed: 08/16/2021 3:03:55 PM By: Donnamarie Poag Entered By: Donnamarie Poag on 08/16/2021 14:21:25 [name] ([id number]) -------------------------------------------------------------------------------- Encounter Discharge Information Details Patient Name: [name], [name]. Date of Service: 08/16/2021 1:45 PM Medical Record Number: [id number] Patient Account Number: [id number] Date of Birth/Sex: 23-Jun-1946 (75 y.o. F) Treating RN: Donnamarie Poag Primary Care Makaiya Geerdes: [name] Other Clinician: Referring Ayris Carano: [name] Treating Donnell Beauchamp/Extender: Yaakov Guthrie in Treatment: 69 Encounter Discharge Information Items Discharge Condition: Stable Ambulatory Status: Ambulatory Discharge Destination: Home Transportation: Private Auto Accompanied By: self Schedule Follow-up Appointment: Yes Clinical Summary of Care: Electronic Signature(s) Signed: 08/16/2021 3:05:24 PM By: Donnamarie Poag Entered By: Donnamarie Poag on 08/16/2021 15:05:04 [name]  ([id number]) -------------------------------------------------------------------------------- Lower Extremity Assessment Details Patient Name: [name], [name]. Date of Service: 08/16/2021 1:45 PM Medical Record Number: [id number] Patient Account Number: [id number] Date of Birth/Sex: 1945/12/21 (75 y.o. F) Treating RN: Donnamarie Poag Primary Care Sharlize Hoar: [name] Other Clinician: Referring Kaelon Weekes: [name] Treating Asees Manfredi/Extender: Yaakov Guthrie in Treatment: 18 Edema Assessment Assessed: [name]: Yes] Patrice Paradise: No] Edema: [Left: Ye] [Right: s] Calf Left: Right: Point of Measurement: 32 cm From Medial Instep 42.5 cm Ankle Left: Right: Point of Measurement: 10 cm From Medial Instep 22.5 cm Knee To Floor Left: Right: From Medial Instep 44 cm Vascular Assessment Pulses: Dorsalis Pedis Palpable: [Left:Yes] Electronic Signature(s) Signed: 08/16/2021 3:03:55 PM By: Donnamarie Poag Entered By: Donnamarie Poag on 08/16/2021 14:16:35 Zima, Tenna [name] ([id number]) -------------------------------------------------------------------------------- Multi Wound Chart Details Patient Name: [name], [name]. Date of Service: 08/16/2021 1:45 PM Medical Record Number: [id number] Patient Account Number: [id number] Date of Birth/Sex: 1946/04/19 (75 y.o. F) Treating RN: Donnamarie Poag Primary Care Cashay Manganelli: [name] Other Clinician: Referring Conchita Truxillo: [name] Treating Frederick Marro/Extender: Yaakov Guthrie in Treatment: 18 Vital Signs Height(in): 63 Pulse(bpm): 80 Weight(lbs): 212 Blood Pressure(mmHg): 158/81 Body Mass Index(BMI): 38 Temperature(F): 98.8 Respiratory Rate(breaths/min): 18 Photos: [N/A:N/A] Wound Location: Left, Distal, Posterior Lower Leg N/A N/A Wounding Event: Gradually Appeared N/A N/A Primary Etiology: Venous Leg Ulcer N/A N/A Comorbid History: Cataracts, Asthma, Sleep Apnea, N/A N/A Deep Vein Thrombosis, Hypertension, Peripheral  Venous Disease, Osteoarthritis, Received Chemotherapy, Received Radiation Date Acquired: 02/24/2021 N/A N/A Weeks of Treatment: 18 N/A N/A Wound Status: Healed - Epithelialized N/A N/A Measurements L x W x D (cm) 0x0x0 N/A N/A Area (cm) : 0 N/A N/A Volume (cm) : 0 N/A N/A % Reduction in Area: 100.00% N/A N/A % Reduction in Volume: 100.00% N/A N/A Classification: Full Thickness Without Exposed N/A N/A Support Structures Exudate Amount: None Present N/A N/A Granulation Amount: None Present (0%) N/A N/A Necrotic Amount: None Present (0%) N/A N/A Exposed Structures: Fascia: No N/A N/A Fat Layer (Subcutaneous Tissue): No Tendon: No Muscle: No Joint: No Bone: No Epithelialization: Large (67-100%) N/A N/A Treatment Notes Electronic Signature(s) Signed: 08/16/2021 3:11:40 PM By: Kalman Shan DO Previous Signature: 08/16/2021 3:03:55 PM Version By: Donnamarie Poag Entered By: Kalman Shan on 08/16/2021 15:07:58 [place], Tenna [name] ([id number]) -------------------------------------------------------------------------------- Multi-Disciplinary Care Plan Details Patient Name: [name], [name]. Date  of Service: 08/16/2021 1:45 PM Medical Record Number: [id number] Patient Account Number: [id number] Date of Birth/Sex: November 08, 1945 (75 y.o. F) Treating RN: Donnamarie Poag Primary Care Felis Quillin: [name] Other Clinician: Referring Bassel Gaskill: [name] Treating Geanie Pacifico/Extender: Yaakov Guthrie in Treatment: 18 Active Inactive Electronic Signature(s) Signed: 08/16/2021 3:03:55 PM By: Donnamarie Poag Entered By: Donnamarie Poag on 08/16/2021 14:16:50 Lover[place], Tenna [name] ([id number]) -------------------------------------------------------------------------------- Non-Wound Condition Assessment Details Patient Name: [name], [name]. Date of Service: 08/16/2021 1:45 PM Medical Record Number: [id number] Patient Account Number: [id number] Date of Birth/Sex: Dec 15, 1945 (75 y.o.  F) Treating RN: Donnamarie Poag Primary Care Danika Kluender: [name] Other Clinician: Referring Dakwan Pridgen: [name] Treating Hibah Odonnell/Extender: Yaakov Guthrie in Treatment: 18 Non-Wound Condition: Condition: Lymphedema Location: Leg Side: Left Photos Electronic Signature(s) Signed: 08/16/2021 3:03:55 PM By: Donnamarie Poag Entered By: Donnamarie Poag on 08/16/2021 14:15:52 Decoster, Tenna [name] ([id number]) -------------------------------------------------------------------------------- Pain Assessment Details Patient Name: [name], [name]. Date of Service: 08/16/2021 1:45 PM Medical Record Number: [id number] Patient Account Number: [id number] Date of Birth/Sex: 02-11-1946 (75 y.o. F) Treating RN: Donnamarie Poag Primary Care [name] [name]: [name] Other Clinician: Referring Caitlynn Ju: [name] Treating Alanis Clift/Extender: Yaakov Guthrie in Treatment: 18 Active Problems Location of Pain Severity and Description of Pain Patient Has Paino No Site Locations Rate the pain. Current Pain Level: 0 Pain Management and Medication Current Pain Management: Electronic Signature(s) Signed: 08/16/2021 3:03:55 PM By: Donnamarie Poag Entered By: Donnamarie Poag on 08/16/2021 14:08:36 Bachicha, Tenna [name] ([id number]) -------------------------------------------------------------------------------- Patient/Caregiver Education Details Patient Name: [name], [name]. Date of Service: 08/16/2021 1:45 PM Medical Record Number: [id number] Patient Account Number: [id number] Date of Birth/Gender: 1946-01-10 (75 y.o. F) Treating RN: Donnamarie Poag Primary Care Physician: [name] Other Clinician: Referring Physician: [name] Treating Physician/Extender: Yaakov Guthrie in Treatment: 70 Education Assessment Education Provided To: Patient Education Topics Provided Electronic Signature(s) Signed: 08/16/2021 3:05:24 PM By: Donnamarie Poag Entered By: Donnamarie Poag on  08/16/2021 15:04:06 [name] ([id number]) -------------------------------------------------------------------------------- Wound Assessment Details Patient Name: [name], [name]. Date of Service: 08/16/2021 1:45 PM Medical Record Number: [id number] Patient Account Number: [id number] Date of Birth/Sex: 04-29-1946 (75 y.o. F) Treating RN: Donnamarie Poag Primary Care Gabino Hagin: [name] Other Clinician: Referring Lex Linhares: [name] Treating Rj Pedrosa/Extender: Yaakov Guthrie in Treatment: 18 Wound Status Wound Number: 13 Primary Venous Leg Ulcer Etiology: Wound Location: Left, Distal, Posterior Lower Leg Wound Healed - Epithelialized Wounding Event: Gradually Appeared Status: Date Acquired: 02/24/2021 Comorbid Cataracts, Asthma, Sleep Apnea, Deep Vein Thrombosis, Weeks Of Treatment: 18 History: Hypertension, Peripheral Venous Disease, Osteoarthritis, Clustered Wound: No Received Chemotherapy, Received Radiation Photos Wound Measurements Length: (cm) 0 Width: (cm) 0 Depth: (cm) 0 Area: (cm) Volume: (cm) % Reduction in Area: 100% % Reduction in Volume: 100% Epithelialization: Large (67-100%) 0 Tunneling: No 0 Undermining: No Wound Description Classification: Full Thickness Without Exposed Support Structures Exudate Amount: None Present Foul Odor After Cleansing: No Slough/Fibrino No Wound Bed Granulation Amount: None Present (0%) Exposed Structure Necrotic Amount: None Present (0%) Fascia Exposed: No Fat Layer (Subcutaneous Tissue) Exposed: No Tendon Exposed: No Muscle Exposed: No Joint Exposed: No Bone Exposed: No Electronic Signature(s) Signed: 08/16/2021 3:03:55 PM By: Donnamarie Poag Entered By: Donnamarie Poag on 08/16/2021 14:15:10 Sorto, Tenna [name] ([id number]) -------------------------------------------------------------------------------- [place] Details Patient Name: [name], [name]. Date of Service: 08/16/2021 1:45 PM Medical Record  Number: [id number] Patient Account Number: [id number] Date of Birth/Sex: 1946-08-07 (75 y.o. F) Treating RN: Donnamarie Poag Primary Care Mischele Detter: [name] Other Clinician: Referring Marianna Cid: [name] Treating Jullian Clayson/Extender: Heber [place],  Jessica Weeks in Treatment: 18 Vital Signs Time Taken: 14:06 Temperature (F): 98.8 Height (in): 63 Pulse (bpm): 80 Weight (lbs): 212 Respiratory Rate (breaths/min): 18 Body Mass Index (BMI): 37.6 Blood Pressure (mmHg): 158/81 Reference Range: 80 - 120 mg / dl Electronic Signature(s) Signed: 08/16/2021 3:03:55 PM By: Donnamarie Poag Entered ByDonnamarie Poag on 08/16/2021 14:08:29

## 2021-08-16 NOTE — Progress Notes (Signed)
[name], [name] ([id number]) Visit Report for 08/16/2021 Chief Complaint Document Details Patient Name: [name], [name]. Date of Service: 08/16/2021 1:45 PM Medical Record Number: [id number] Patient Account Number: [id number] Date of Birth/Sex: [birth year] (75 y.o. F) Treating RN: Donnamarie Poag Primary Care Provider: [name] Other Clinician: Referring Provider: [name] Treating Provider/Extender: Yaakov Guthrie in Treatment: 18 Information Obtained from: Patient Chief Complaint Left calf venous stasis ulceration. 11/13/16; the patient is here for a left calf recurrent ulceration which is painful and has been present for the last month 01/22/17; the patient re-presents here for recurrent difficulties with blistering and leaking fluid on her left posterior calf 12/10/18; the patient returns to clinic for a wound on the left medial calf 04/12/2021; left lower extremity wound, 05/31/21; right leg wound Electronic Signature(s) Signed: 08/16/2021 3:11:40 PM By: Kalman Shan DO Entered By: Kalman Shan on 08/16/2021 15:08:16 [place], [name] ([id number]) -------------------------------------------------------------------------------- HPI Details Patient Name: [name], [name]. Date of Service: 08/16/2021 1:45 PM Medical Record Number: [id number] Patient Account Number: [id number] Date of Birth/Sex: [birth year] (75 y.o. F) Treating RN: Donnamarie Poag Primary Care Provider: [name] Other Clinician: Referring Provider: [name] Treating Provider/Extender: Yaakov Guthrie in Treatment: 18 History of Present Illness HPI Description: Pleasant 75 year old with history of chronic venous insufficiency. No diabetes or peripheral vascular disease. Left ABI 1.29. Questionable history of left lower extremity DVT. She developed a recurrent ulceration on her left lateral calf in December 2015, which she attributes to poor diet and subsequent lower  extremity edema. She underwent endovenous laser ablation of her left greater saphenous vein in 2010. She underwent laser ablation of accessory branch of left GSV in April 2016 by Dr. Kellie Simmering at [facility]. She was previously wearing Unna boots, which she tolerated well. Tolerating 2 layer compression and cadexomer iodine. She returns to clinic for follow-up and is without new complaints. She denies any significant pain at this time. She reports persistent pain with pressure. No claudication or ischemic rest pain. No fever or chills. No drainage. READMISSION 11/13/16; this is a 75 year old woman who is not a diabetic. She is here for a review of a painful area on her left medial lower extremity. I note that she was seen here previously last year for wound I believe to be in the same area. At that time she had undergone previously a left greater saphenous vein ablation by Dr. Kellie Simmering and she had a ablation of the anterior accessory branch of the left greater saphenous vein in March 2016. Seeing that the wound actually closed over. In reviewing the history with her today the ulcer in this area has been recurrent. She describes a biopsy of this area in 2009 that only showed stasis physiology. She also has a history of today malignant melanoma in the right shoulder for which she follows with Dr. 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 Do not see that she has ever had arterial studies in the left leg. She tells me she has a follow-up with Dr. Kellie Simmering in roughly 10 days In any case she developed the reopening of this area roughly a month ago. On the background of this she describes rapidly increasing edema which has responded to Lasix 40 mg and metolazone 2.5 mg as well as the patient's lymph massage. She has been told she has both venous insufficiency and lymphedema but she cannot tolerate  compression stockings 11/28/16; the patient saw Dr. Kellie Simmering recently. Per the patient he did arterial Dopplers in the office that did not show evidence of arterial insufficiency, per the patient he stated "treat this like an ordinary venous ulcer". She also saw her dermatologist Dr. Ronnald Ramp who felt that this was more of a vascular ulcer. In general things are improving although she arrives today with increasing bilateral lower extremity edema with weeping a deeper fluid through the wound on the left medial leg compatible with some degree of lymphedema 12/04/16; the patient's wound is fully epithelialized but I don't think fully healed. We will do another week of depression with Promogran and TCA however I suspect we'll be able to discharge her next week. This is a very unusual-looking wound which was initially a figure-of-eight type wound lying on its side surrounded by petechial like hemorrhage. She has had venous ablation on this side. She apparently does not have an arterial issue per Dr. Kellie Simmering. She saw her dermatologist thought it was "vascular". Patient is definitely going to need ongoing compression and I talked about this with her today she will go to elastic therapy after she leaves here next week 12/11/16; the patient's wound is not completely closed today. She has surrounding scar tissue and in further discussion with the patient it would appear that she had ulcers in this area in 2009 for a prolonged period of time ultimately requiring a punch biopsy of this area that only showed venous insufficiency. I did not previously pickup on this part of the history from the patient. 12/18/16; the patient's wound is completely epithelialized. There is no open area here. She has significant bilateral venous insufficiency with secondary lymphedema to a mild-to-moderate degree she does not have compression stockings.. She did not say anything to me when I was in the room, she told our intake nurse that she  was still having pain in this area. This isn't unusual recurrent small open area. She is going to go to elastic therapy to obtain compression stockings. 12/25/16; the patient's wound is fully epithelialized. There is no open area here. The patient describes some continued episodic discomfort in this area medial left calf. However everything looks fine and healed here. She is been to elastic therapy and caught herself 15-20 mmHg stockings, they apparently were having trouble getting 20-30 mm stockings in her size 01/22/17; this is a patient we discharged from the clinic a month ago. She has a recurrent open wound on her medial left calf. She had 15 mm support stockings. I told her I thought she needed 20-30 mm compression stockings. She tells me that she has been ill with hospitalization secondary to asthma and is been found to have severe hypokalemia likely secondary to a combination of Lasix and metolazone. This morning she noted blistering and leaking fluid on the posterior part of her left leg. She called our intake nurse urgently and we was saw her this afternoon. She has not had any real discomfort here. I don't know that she's been wearing any stockings on this leg for at least 2-3 days. ABIs in this clinic were 1.21 on the right and 1.3 on the left. She is previously seen vascular surgery who does not think that there is a peripheral arterial issue. 01/30/17; Patient arrives with no open wound on the left leg. She has been to elastic therapy and obtained 20-8mmhg below knee stockings and she has one on the right leg today. READMISSION 02/19/18; this Pall is a now 76 year old  patient we've had in this clinic perhaps 3 times before. I had last looked at her from January 07 December 2016 with an area on the medial left leg. We discharged her on 12/25/16 however she had to be readmitted on 01/22/17 with a recurrence. I have in my notes that we discharged her on 20-30 mm stockings although she tells me  she was only wearing support hose because she cannot get stockings on predominantly related to her cervical spine surgery/issues. She has had previous ablations done by vein and vascular in [place] including a great saphenous vein ablation on the left with an anterior accessory branch ablation I think both of these were in 2016. On one of the previous visit she had a biopsy noted 2009 that was negative. She is not felt to have an arterial issue. She is not a diabetic. She does have a history of obstructive sleep apnea hypertension asthma as well as chronic venous insufficiency and lymphedema. On this occasion she noted 2 dry scaly patch on her left leg. She tried to put lotion on this it didn't really help. There were 2 open areas.the patient has been seeing her primary physician from 02/05/18 through 02/14/18. She had Unna boots applied. The superior wound now on the lateral left leg has closed but she's had one wound that remains open on the lateral left leg. This is not the same spot as we dealt with in 2018. ABIs in this clinic were 1.3 bilaterally [name], [name] ([id number]) 02/26/18; patient has a small wound on the left lateral calf. Dimensions are down. She has chronic venous insufficiency and lymphedema. 03/05/18; small open area on the left lateral calf. Dimensions are down. Tightly adherent necrotic debris over the surface of the wound which was difficult to remove. Also the dressing [over collagen] stuck to the wound surface. This was removed with some difficulty as well. Change the primary dressing to Hydrofera Blue ready 03/12/18; small open area on the left lateral calf. Comes in with tightly adherent surface eschar as well as some adherent Hydrofera Blue. 03/19/18; open area on the left lateral calf. Again adherent surface eschar as well as some adherent Hydrofera Blue nonviable subcutaneous tissue. She complained of pain all week even with the reduction from 4-3 layer compression I put on  last week. Also she had an increase in her ankle and calf measurements probably related to the same thing. 03/26/18; open area on the left lateral calf. A very small open area remains here. We used silver alginate starting last week as the Hydrofera Blue seem to stick to the wound bed. In using 4-layer compression 04/02/18; the open area in the left lateral calf at some adherent slough which I removed there is no open area here. We are able to transition her into her own compression stocking. Truthfully I think this is probably his support hose. However this does not maintain skin integrity will be limited. She cannot put over the toe compression stockings on because of neck problems hand problems etc. She is allergic to the lining layer of juxta lites. We might be forced to use extremitease stocking should this fail READMIT 11/24/2018 Patient is now a 75 year old woman who is not a diabetic. She has been in this clinic on at least 3 previous occasions largely with recurrent wounds on her left leg secondary to chronic venous insufficiency with secondary lymphedema. Her situation is complicated by inability to get stockings on and an allergy to neoprene which is apparently a component  and at least juxta lites and other stockings. As a result she really has not been wearing any stockings on her legs. She tells [place] that roughly 2 or 3 weeks ago she started noticing a stinging sensation just above her ankle on the left medial aspect. She has been diagnosed with pseudogout and she wondered whether this was what she was experiencing. She tried to dress this with something she bought at the store however subsequently it pulled skin off and now she has an open wound that is not improving. She has been using Vaseline gauze with a cover bandage. She saw her primary doctor last week who put an Haematologist on her. ABIs in this clinic was 1.03 on the left 2/12; the area is on the left medial ankle. Odd-looking wound  with what looks to be surface epithelialization but a multitude of small petechial openings. This clearly not closed yet. We have been using silver alginate under 3 layer compression with TCA 2/19; the wound area did not look quite as good this week. Necrotic debris over the majority of the wound surface which required debridement. She continues to have a multitude of what looked to be small petechial openings. She reminds [place] that she had a biopsy on this initially during her first outbreak in 2015 in [place] dermatology. She expresses concern about this being a possible melanoma. 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 I assured her that this does not look anything like melanoma. Except for the petechial reaction it does look like a venous insufficiency area and she certainly has evidence of this on both sides 2/26; a difficult area on the left medial ankle. The patient clearly has chronic venous hypertension with some degree of lymphedema. The odd thing about the area is the small petechial hemorrhages. I am not really sure how to explain this. This was present last time and this is not a compression injury. We have been using Hydrofera Blue which I changed to last week 3/4; still using Hydrofera Blue. Aggressive debridement today. She does not have known arterial issues. She has seen Dr. Kellie Simmering at [facility] vein and vascular and and has an ablation on the left. [Anterior accessory branch of the greater saphenous]. From what I remember they did not feel she had an arterial issue. The patient has had this area biopsied in 2009 at [facility] dermatology and by her recollection they said this was "stasis". She is also follow-up with dermatology locally who thought that this was more of a vascular issue 3/11; using Hydrofera Blue. Aggressive debridement today. She does not have an arterial issue. We are using 3  layer compression although we may need to go to 4. The patient has been in for multiple changes to her wrap since I last saw her a week ago. She says that the area was leaking. I do not have too much more information on what was found 01/19/19 on evaluation today patient was actually being seen for a nurse visit when unfortunately she had the area on her left lateral lower extremity as well as weeping from the right lower extremity that became apparent. Therefore we did end up actually seeing her for a full visit with myself. She is having some pain at this site as well but fortunately nothing too significant at this point. No fevers, chills, nausea, or vomiting noted at this time. 3/18-Patient is back to the clinic with the  left leg venous leg ulcer, the ulcer is larger in size, has a surface that is densely adherent with fibrinous tissue, the Hydrofera Blue was used but is densely adherent and there was difficulty in removing it. The right lower extremity was also wrapped for weeping edema. Patient has a new area over the left lateral foot above the malleolus that is small and appears to have no debris with intact surrounding skin. Patient is on increased dose of Lasix also as a means to edema management 3/25; the patient has a nonhealing venous ulcer on the medial left leg and last week developed a smaller area on the lateral left calf. We have been using Hydrofera Blue with a contact layer. 4/1; no major change in these wounds areas. Left medial and more recently left lateral calf. I tried Iodoflex last week to aid in debridement she did not tolerate this. She stated her pain was terrible all week. She took the top layer of the 4 layer compression off. 4/8; the patient actually looks somewhat better in terms of her more prominent left lateral calf wound. There is some healthy looking tissue here. She is still complaining of a lot of discomfort. 4/15; patient in a lot of pain secondary to  sciatica. She is on a prednisone taper prescribed by her primary physician. She has the 2 areas one on the left medial and more recently a smaller area on the left lateral calf. Both of these just above the malleoli 4/22; her back pain is better but she still states she is very uncomfortable and now feels she is intolerant to the [employer]. No real change in the wounds we have been using Sorbact. She has been previously intolerant to Iodoflex. There is not a lot of option about what we can use to debride this wound under compression that she no doubt needs. sHe states Ultram no longer works for her pain 4/29; no major change in the wounds slightly increased depth. Surface on the original medial wound perhaps somewhat improved however the more recent area on the lateral left ankle is 100% covered in very adherent debris we have been using Sorbact. She tolerates 4 layer compression well and her edema control is a lot better. She has not had to come in for a nurse check 5/6; no major change in the condition of the wounds. She did consent to debridement today which was done with some difficulty. Continuing Sorbact. She did not tolerate Iodoflex. She was in for a check of her compression the day after we wrapped her last week this was adjusted but nothing much was found 5/13; no major change in the condition or area of the wounds. I was able to get a fairly aggressive debridement done on the lateral left leg wound. Even using Sorbact under compression. She came back in on Friday to have the wrap changed. She says she felt uncomfortable on the lateral aspect of her ankle. She has a long history of chronic venous insufficiency including previous ablation surgery on this side. 5/20-Patient returns for wounds on left leg with both wounds covered in slough, with the lateral leg wound larger in size, she has been in 3 layer compression and felt more comfortable, she describes pain in ankle, in leg and pins and  needles in foot, and is about to try Pamelor for this 6/3; wounds on the left lateral and left medial leg. The area medially which is the most recent of the 2 seems to have had the  largest increase in [place], [place] ([id number]) dimensions. We have been using Sorbac to try and debride the surface. She has been to see orthopedics they apparently did a plain x-ray that was indeterminant. Diagnosed her with neuropathy and they have ordered an MRI to determine if there is underlying osteomyelitis. This was not high on my thought list but I suppose it is prudent. We have advised her to make an appointment with vein and vascular in [place]. She has a history of a left greater saphenous and accessory vein ablations I wonder if there is anything else that can be done from a surgical point of view to help in these difficult refractory wounds. We have previously healed this wound on one occasion but it keeps on reopening [medial side] 6/10; deep tissue culture I did last week I think on the left medial wound showed both moderate E. coli and moderate staph aureus [MSSA]. She is going to require antibiotics and I have chosen Augmentin. We have been using Sorbact and we have made better looking wound surface on both sides but certainly no improvement in wound area. She was back in last Friday apparently for a dressing changes the wrap was hurting her outer left ankle. She has not managed to get a hold of vein and vascular in [place]. We are going to have to make her that appointment 6/17; patient is tolerating the Augmentin. She had an MRI that I think was ordered by orthopedic surgeon this did not show osteomyelitis or an abscess did suggest cellulitis. We have been using Sorbact to the lateral and medial ankles. We have been trying to arrange a follow-up appointment with vein and vascular in [place] or did her original ablations. We apparently an area sent the request to vein and vascular in  [facility] 6/24; patient has completed the Augmentin. We do not yet have a vein and vascular appointment in [place]. I am not sure what the issue is here we have asked her to call tomorrow. We are using Sorbact. Making some improvements and especially the medial wound. Both surfaces however look better medial and lateral. 7/1; the patient has been in contact with vein and vascular in [place] but has not yet received an appointment. Using Sorbact we have gradually improve the wound surface with no improvement in surface area. She is approved for Apligraf but the wound surface still is not completely viable. She has not had to come in for a dressing change 7/8; the patient has an appointment with vein and vascular on 7/31 which is a Friday afternoon. She is concerned about getting back here for [place] to dress her wounds. I think it is important to have them goal for her venous reflux/history of ablations etc. to see if anything else can be done. She apparently tested positive for 1 of the blood tests with regards to lupus and saw a rheumatologist. He has raised the issue of vasculitis again. I have had this thought in the past however the evidence seems overwhelming that this is a venous reflux etiology. If the rheumatologist tells me there is clinical and laboratory investigation is positive for lupus I will rethink this. 7/15; the patient's wound surfaces are quite a bit better. The medial area which was her original wound now has no depth although the lateral wound which was the more recent area actually appears larger. Both with viable surfaces which is indeed better. Using Sorbact. I wanted to use Apligraf on her however there is the issue of the  vein and vascular appointment on 7/31 at 2:00 in the afternoon which would not allow her to get back to be rewrapped and they would no doubt remove the graft 7/22; the patient's wound surfaces have moderate amount of debris although generally look  better. The lateral one is larger with 2 small satellite areas superiorly. We are waiting for her vein and vascular appointment on 7/31. She has been approved for Apligraf which I would like to use after th 7/29; wound surfaces have improved no debridement is required we have been using Sorbact. She sees vein and vascular on Friday with this so question of whether anything can be done to lessen the likelihood of recurrence and/or speed the healing of these areas. She is already had previous ablations. She no doubt has severe venous hypertension 8/5-Patient returns at 1 week, she was in [place] for 3 days by her podiatrist, we have been using so backed to the wound, she has increased pain in both the wounds on the left lower leg especially the more distal one on the lateral aspect 8/12-Patient returns at 1 week and she is agreeable to having debridement in both wounds on her left leg today. We have been using Sorbact, and vascular studies were reviewed at last visit 8/19; the patient arrives with her wounds fairly clean and no debridement is required. We have used Sorbact which is really done a nice job in cleaning up these very difficult wound surfaces. The patient saw Dr. Donzetta Matters of vascular surgery on 7/31. He did not feel that there was an arterial component. He felt that her treated greater saphenous vein is adequately addressed and that the small saphenous vein did not appear to be involved significantly. She was also noted to have deep venous reflux which is not treatable. Dr. Donzetta Matters mentioned the possibility of a central obstructive component leading to reflux and he offered her central venography. She wanted to discuss this or think about it. I have urged her to go ahead with this. She has had recurrent difficult wounds in these areas which do heal but after months in the clinic. If there is anything that can be done to reduce the likelihood of this I think it is worth it. 9/2 she is still  working towards getting follow-up with Dr. Donzetta Matters to schedule her CT. Things are quite a bit worse venography. I put Apligraf on 2 weeks ago on both wounds on the medial and lateral part of her left lower leg. She arrives in clinic today with 3 superficial additional wounds above the area laterally and one below the wound medially. She describes a lot of discomfort. I think these are probably wrapped injuries. Does not look like she has cellulitis. 07/20/2019 on evaluation today patient appears to be doing somewhat poorly in regard to her lower extremity ulcers. She in fact showed signs of erythema in fact we may even be dealing with an infection at this time. Unfortunately I am unsure if this is just infection or if indeed there may be some allergic reaction that occurred as a result of the Apligraf application. With that being said that would be unusual but nonetheless not impossible in this patient is one who is unfortunately allergic to quite a bit. Currently we have been using the Sorbact which seems to do as well as anything for her. I do think we may want to obtain a culture today to see if there is anything showing up there that may need to be addressed.  9/16; noted that last week the wounds look worse in 1 week follow-up of the Apligraf. Using Sorbact as of 2 days ago. She arrives with copious amounts of drainage and new skin breakdown on the back of the left calf. The wounds arm more substantial bilaterally. There is a fair amount of swelling in the left calf no overt DVT there is edema present I think in the left greater than right thigh. She is supposed to go on 9/28 for CT venography. The wounds on the medial and lateral calf are worse and she has new skin breakdown posteriorly at least new for me. This is almost developing into a circumferential wound area The Apligraf was taken off last week which I agree with things are not going in the right direction a culture was done we do not have  that back yet. She is on Augmentin that she started 2 days ago 9/23; dressing was changed by her nurses on Monday. In general there is no improvement in the wound areas although the area looks less angry than last week. She did get Augmentin for MSSA cultured on the 14th. She still appears to have too much swelling in the left leg even with 3 layer compression 9/30; the patient underwent her procedure on 9/28 by Dr. Donzetta Matters at [facility]. She was discovered to have the common iliac vein measuring 12.2 mm but at the level of L4-L5 measured 3 mm. After stenting it measured 10 mm. 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 Rouleaux flow in the common femoral and femoral vein was observed much improved after stenting. We are using silver alginate to the wounds on the medial and lateral ankle on the left. 4 layer compression 10/7; the patient had fluid swelling around her knee and 4 layer compression. At the advice of vein and vascular this was reduced to 3 layer which she is tolerating better. We have been using silver alginate under 3 layer compression since last Friday 10/14; arrives with the areas on the left ankle looking a lot better. Inflammation in the area also a lot better. She came in for a nurse check on 10/9 10/21; continued nice improvement. Slight improvements in surface area of both the medial and lateral wounds on the left. A lot of the satellite lesions in the weeping erythema around these from stasis dermatitis is resolved. We have been using silver alginate [name], [name] ([id number]) 10/28; general improvement in the entire wound areas although not a lot of change in dimensions the wound certainly looks better. There is a lot less in terms of venous inflammation. Continue silver alginate this week however look towards Hydrofera Blue next week 11/4; very adherent debris on the medial wound left wound is not as bad. We have been using silver alginate.  Change to [facility] today 11/11; very adherent debris on both wound areas. She went to vein and vascular last week and follow-up they put in [place] boot on this today. He says the [facility] was adherent. Wound is definitely not as good as last week. Especially on the left there the satellite lesions look more prominent 11/18; absolutely no better. erythema on lateral aspect with tenderness. 09/30/2019 on evaluation today patient appears to actually be doing better. Dr. Dellia Nims did put her on doxycycline last week which I do believe has helped her at this point. Fortunately there is no signs of active infection at this time. No fevers, chills, nausea, vomiting, or diarrhea. I  do believe he may want extend the doxycycline for 7 additional days just to ensure everything does completely cleared up the patient is in agreement with that plan. Otherwise she is going require some sharp debridement today 12/2; patient is completing a 2-week course of doxycycline. I gave her this empirically for inflammation as well as infection when I last saw her 2 weeks ago. All of this seems to be better. She is using silver alginate she has the area on the medial aspect of the larger area laterally and the 2 small satellite regions laterally above the major wound. 12/9; the patient's wound on the left medial and left lateral calf look really quite good. We have been using silver alginate. She saw vein and vascular in follow-up on 10/09/2019. She has had a previous left greater saphenous vein ablation by Dr. Oscar La in 2016. More recently she underwent a left common iliac vein stent by Dr. 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 The swelling is improved and certainly the wounds have improved. The patient shows [place] today area on the right medial calf there is almost no wound but leaking lymphedema. She says she start this started 3 or 4 days ago. She did not traumatize it. It is not painful. She does not  wear compression on that side 12/16; the patient continues to do well laterally. Medially still requiring debridement. The area on the right calf did not materialize to anything and is not currently open. We wrapped this last time. She has support stockings for that leg although I am not sure they are going to provide adequate compression 12/23; the lateral wound looks stable. Medially still requiring debridement for tightly adherent fibrinous debris. We've been using silver alginate. Surface area not any different 12/30; neither wound is any better with regards to surface and the area on the left lateral is larger. I been using silver alginate to the left lateral which look quite good last week and Sorbact to the left medial 11/11/2019. Lateral wound area actually looks better and somewhat smaller. Medial still requires a very aggressive debridement today. We have been using Sorbact on both wound areas 1/13; not much better still adherent debris bilaterally. I been using Sorbact. She has severe venous hypertension. Probably some degree of dermal fibrosis distally. I wonder whether tighter compression might help and I am going to try that today. We also need to work on the bioburden 1/20; using Sorbact. She has severe venous hypertension status post stent placement for pelvic vein compression. We applied gentamicin last time to see if we could reduce bioburden I had some discussion with her today about the use of pentoxifylline. This is occasionally used in this setting for wounds with refractory venous insufficiency. However this interacts with Plavix. She tells me that she was put on this after stent placement for 3 months. She will call Dr. Claretha Cooper office to discuss 1/27; we are using gentamicin under Sorbact. 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 She has a stent. Wound medially is measuring smaller this week. Laterally measuring slightly larger although she has some  satellite lesions superiorly 2/3; gentamicin under Sorbact under 4-layer compression. 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 She has a stent on Plavix. Her wounds are measuring smaller this week. More substantially laterally where there is a satellite lesion superiorly. 2/10; gentamicin under Sorbac. 4-layer compression. Patient communicated with Dr. Donzetta Matters at vein and vascular in [place]. He is okay with the patient coming  off Plavix I will therefore start her on pentoxifylline for a 1 month trial. In general her wounds look better today. I had some concerns about swelling in the left thigh however she measures 61.5 on the right and 63 on the mid thigh which does not suggest there is any difficulty. The patient is not describing any pain. 2/17; gentamicin under Sorbac 4-layer compression. She has been on pentoxifylline for 1 week and complains of loose stool. No nausea she is eating and drinking well 2/24; the patient apparently came in 2 days ago for a nurse visit when her wrap fell down. Both areas look a little worse this week macerated medially and satellite lesions laterally. Change to silver alginate today 3/3; wounds are larger today especially medially. She also has more swelling in her foot lower leg and I even noted some swelling in her posterior thigh which is tender. I wonder about the patency of her stent. Fortuitously she sees Dr. Claretha Cooper group on Friday 3/10; Mrs. Bow was seen by vein and vascular on 3/5. The patient underwent ultrasound. There was no evidence of thrombosis involving the IVC no evidence of thrombosis involving the right common iliac vein there is no evidence of thrombosis involving the right external iliac vein the left external vein is also patent. The right common iliac vein stent appears patent bilateral common femoral veins are compressible and appear patent. I was concerned about the left common iliac stent however it looks like  this is functional. She has some edema in the posterior thigh that was tender she still has that this week. I also note they had trouble finding the pulses in her left foot and booked her for an ABI baseline in 4 weeks. She will follow up in 6 months for repeat IVC duplex. The patient stopped the pentoxifylline because of diarrhea. It does not look like that was being effective in any case. I have advised her to go back on her aspirin 81 mg tablet, vascular it also suggested this 3/17; comes in today with her wound surfaces a lot better. The excoriations from last week considerably better probably secondary to the TCA. We have been using silver alginate 3/24; comes in today with smaller wounds both medially and laterally. Both required debridement. There are 2 small satellite areas superiorly laterally. She also has a very odd bandlike area in the mid calf almost looking like there was a weakness in the wrap in a localized area. I would write this off as being this however anteriorly she has a small raised ballotable area that is very tender almost reminiscent of an abscess but there was no obvious purulent surface to it. 02/04/20 upon evaluation today patient appears to be doing fairly well in regard to her wounds today. Fortunately there is no signs of active infection at this time. No fevers, chills, nausea, vomiting, or diarrhea. She has been tolerating the dressing changes without complication. Fortunately I feel like she is showing signs of improvement although has been sometime since have seen her. Nonetheless the area of concern that Dr. Dellia Nims had last week where she had possibly an area of the wrap that was we can allow the leg to bulge appears to be doing significantly better today there is no signs of anything worsening. [name], [name] ([id number]) 4/7; the patient's wounds on her medial and lateral left leg continue to contract. We have been using a regular alginate. Last week she  developed an area on the right medial lower leg  which is probably a venous ulcer as well. 4/14; the wounds on her left medial and lateral lower leg continue to contract. Surface eschar. We have been using regular alginate. The area on the right medial lower leg is closed. We have been putting both legs under 4-layer contraction. The patient went back to see vein and vascular she had arterial studies done which were apparently "quite good" per the patient although I have not read their notes I have never felt she had an arterial issue. 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 This is been longstanding and refractory to exercise, leg elevation and longstanding use of compression wraps in our clinic as well as compression stockings on the times we have been able to get these to heal 4/21; we thought she actually might be close this week however she arrives in clinic with a lot of edema in her upper left calf and into her posterior thigh. This is been an intermittent problem here. She says the wrap fell down but it was replaced with a nurse visit on Monday. We are using calcium alginate to the wounds and the wound sizes there not terribly larger than last week but there is a lot more edema 4/28; again wound edges are smaller on both sides. Her edema is better controlled than last time. She is obtained her compression pumps from [employer] although they have not been to her home to set these up. 5/5; left medial and left lateral both look stable. I am not sure the medial is any smaller. We have been using calcium alginate under 4-layer compression. oShe had an area on the right medial. This was eschared today. We have been wrapping this as well. She does not tolerate external compression stockings due to a history of various contact allergies. She has her compression pumps however the representative from the company is coming on her to show her how  to use these tomorrow 5/19; patient with severe chronic venous insufficiency secondary to central venous disease. She had a stent placed in her left common iliac vein. She has done better since but still difficult to control wounds. She comes in today with nothing open on the right leg. Her areas on the left medial and left lateral are just about closed. We are using calcium alginate under 4-layer compression. She is using her external compression pumps at home She only has 15-20 support stockings. States she cannot get anything tighter than that on. 03/30/20-Patient returns at 1 week, the wounds on the left leg are both slightly bigger, the last week she was on 3 layer compression which started to slide down. She is starting to use her lymphedema pumps although she stated on 1 day her right ankle started to swell up and she have to stop that day. Unfortunately the open area seem to oscillate between improving to the point of healing and then flaring up all to do with effectiveness of compression or lack of due to the left leg topography not keeping the compression wraps from rolling down 6/2 patient comes in with a 15/20 mmHg stocking on the right leg. She tells me that she developed a lot of swelling in her ankles she saw orthopedics she was felt to possibly be having a flare of pseudogout versus some other type of arthritis. She was put on steroids for a respiratory issue so that helps with the inflammation. She has not been using the pumps all week. She thinks the left thigh  is more swollen than usual and I would agree with that. She has an appointment with Dr. Donzetta Matters 9 days or so from now 6/9; both wounds on the left medial and left lateral are smaller. We have been using calcium alginate under compression. She does not have an open wound on the right leg she is using a stocking and her compression pumps things are going well. She has an appointment with Dr. Donzetta Matters with regards to her stent in the  left common iliac vein 6/16; the wounds on the left medial and left lateral ankle continues to contract. The patient saw Dr. Donzetta Matters and I think he seems satisfied. Ordered follow-up venous reflux studies on both sides in September. Cautioned that she may need thigh-high stockings. She has been using calcium alginate under compression on the left and her own stocking on the right leg. She tells [place] there are no open wounds on the right 6/23; left lateral is just about closed. Medial required debridement today. We have been using calcium alginate. Extensive discussion about the compression pumps she is only using these on 25 mmHg states she could not take 40 or 30 when the wrap came out to her home to demonstrate these. He said they should not feel tight 6/30; the left lateral wound has a slight amount of eschar. . The area medially is about the same using Hydrofera Blue. 7/7; left lateral wound still has some eschar. I will remove this next week may be closed. The area medially is very small using Hydrofera Blue with improvement. Unfortunately the stockings fell down. Unfortunately the blisters have developed at the edge of where the wrap fell. When this happened she says her legs hurt she did not use her pumps. We are not open Monday for her to come in and change the wraps and she had an appointment yesterday. She also tells me that she is going to have an MRI of her back. She is having pain radiating into her left anterior leg she thinks her from an L5 disc. She saw Dr. Ellene Route of neurosurgery 7/14; the area on the left lateral ankle area is closed. Still a small area medially however it looks better as well. We have been using Hydrofera Blue under 4-layer compression 7/21; left lateral ankle is still closed however her wound on the medial left calf is actually larger. This is probably because Hydrofera Blue got stuck to the wound. She came in for a nurse change on Friday and will do that again this  week I was concerned about the amount of swelling that she had last week however she is using her compression pumps twice a day and the swelling seems well controlled 7/28; remaining wound on the left medial lower leg is smaller. We have been using moistened silver collagen under compression she is coming back for a nurse visit. For reasons that were not really clear she was just keeping her legs elevated and not using her compression pumps. I have asked her to use the compression pumps. She does not have any wounds on the right leg 06/15/20-Patient returns at 2 weeks, her LLE edema is worse and she developed a blister wound that is new and has bigger posterior calf wound on right, we are using Prisma with pad, 4 layer compression. she has been on lasix 40 mg daily 8/18; patient arrives today with things a lot worse than I remember from a few weeks ago. She was seen last week. Noted that her edema was  worse and that she now had a left lateral wound as well as deteriorating edema in the medial and posterior part of the lower leg. She says she is using his or her external compression pumps once a day although I wonder about the compliance. 8/25; weeping area on the right medial lower leg. This had actually gotten a small localized area of her compression stocking wet. oOn the left side there is a large denuded area on the posterior medial lower leg and smaller area on the lateral. This was not the original areas that we dealt with. 9/1 the patient's wound on the left leg include the left lateral and left posterior. Larger superficial wounds weeping. She has very poor edema control. Tender localized edema in the left lower medial ankle/heel probably because of localized wrap issues. She freely admits she is not using the compression pumps. She has been up on her feet a lot. She thinks the hydrofera blue is contributing to the pain she is experiencing.. This is a complaint that I have occasionally  heard 9/8; really not much improvement. The patient is still complaining of a lot of pain particularly when she uses compression pumps. I switched her to silver alginate last time because she found the Hydrofera Blue to be irritating. I don't hear much difference in her description with the silver alginate. 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 She has follow-up with Dr. Donzetta Matters I think for ultrasound next month GEORGENA, WEISHEIT ([id number]) 9/15; quite a bit of improvement today. We have less edema and more epithelialization in both of her wound areas on the left medial and left lateral calf. These are not the site of her original wounds in this area. She says she has been using her compression pumps for 30 minutes twice a day, there pain issues that never quite understood. Silver alginate as the primary dressing 9/22; continued improvement. Both areas medially and laterally still have a small open area there is some eschar. She continues to complain of left medial ankle pain. Swelling in the leg is in much better condition. We have been using silver alginate 9/29; continued improvement. Both areas medially and laterally in the left calf look as though they are close some minor surface eschar but I think this is epithelialized. She comes in today saying she has a ruptured disc at L4-L5 cannot bend over to put on her stockings. 10/6; patient comes in today with no open wounds on either leg. However her edema on the left leg in the upper one third of the lower leg is poorly controlled nonpitting. She says that she could not use the pumps for 2 days and then she has been using the last couple of days. It is not clear to me she has been able to get her stocking on. She has back problems. Mrs. Schlink has severe chronic venous insufficiency with secondary lymphedema. Her venous insufficiency is partially centrally mediated and  that she is now post stent in the left common iliac vein. [facility] Thurner's syndrome/physiology]. She follows up with them on 10/15. She wears 20/30 below-knee stockings. She is supposed to use compression pumps at home although I think her compliance about with this is been less than 100%. I have asked her to use these 3 times a day. Finally I think she has lipodermatosclerosis in the left lower leg with an inverted bottle sign. It is been  a major problem controlling the edema in the left leg. The right leg we have had wounds on but not as significant a problem is on the left READMISSION 04/12/2021 Mrs. Schwarz is a 75 year old woman we know well in this clinic. She has severe chronic venous insufficiency. 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 I believe she has had bilateral greater saphenous vein ablation in the past as well. She tells me that this wound opened sometime in March. She had a fall and thinks it was initially abrasion. She developed areas she describes as little blisters on the anterior part of her leg and she saw dermatology and was treated for methicillin staph aureus with several rounds of antibiotics. She has been using support stockings on the left leg and says this is the only thing she can get on. Her compression pump use maybe once a day she says if she did not use one she use the other. She comes in today with incredible swelling in the left leg with a wound on the left posterior calf. She has been using Neosporin to this previously a hydrocolloid. 6/15; patient arrives back for 1 week follow-up.Marland Kitchen Apparently her wrap fell down she did not call us to replace this. He has poor edema control. She only uses her compression pumps once a day 6/29; patient presents for 1 week follow-up. She has tolerated the compression wrap well. We have been using Iodoflex under the wrap. She has no issues or complaints today. 7/6; patient presents for 1  week follow-up. She states that the compression wrap rolled down her leg 4 days ago. She has been trying to keep the area covered but has no dressings at home to use. She denies signs of infection. 7/13; patient presents for 1 week follow-up. She states that her compression wrap rolled down her right leg and she called our office and had it placed a few days ago. She has been tolerating the current wrap well. She states that the Iodoflex is causing a burning sensation. She denies signs of infection. 7/27; patient presents for 1 week follow-up She has tolerated the compression wrap well with Hydrofera Blue underneath. She has now developed a wound to her right lower extremity. She reports having a culture done To this area by her dermatologist that she reports is negative. She currently denies signs of infection. 8/30; patient presents for 1 week follow-up. On the left side she has tolerated the compression wrap well with Hydrofera Blue underneath. She reports some discomfort to her right lower extremity with the 3 layer compression. She currently denies signs of infection. 06/14/2021 upon evaluation today patient's wound actually appears to be doing decently well based on what I am seeing currently. She actually has 2 areas 1 on the left distal/posterior lower leg and the other on the right lower leg. Subsequently the measurements are roughly the same may be slightly smaller but in general have not made any trend towards getting worse which is great news. 06/23/2021 upon evaluation today patient appears to be doing pretty well in regard to her wounds. On the right she is having difficulty with sciatic pain and this subsequently has led to her taking the wrap off over the last week her leg is more swollen she is also been on prednisone for 10 days. With that being said I think we may want to just use a compression sock on the left that way she will not have  to worry about the compression wrap being in  place that she cannot get off. With that being said I do believe as well that the patient is going to need to continue with the wrapping on the left we will also need to do a little bit of sharp debridement today. 8/24; patient presents for 1 week follow-up. She has no issues or complaints today. She denies signs of infection. 8/31; patient presents for 1 week follow-up. She has no issues or complaints today. She has tolerated the compression wrap well on the left lower extremity. She denies signs of infection. 9/7; patient presents for follow-up. She reports that the compression wrap rolled down slightly on her right lower extremity. She reports tenderness to the wound bed. She denies increased warmth or erythema to the surrounding skin. She overall feels well. 9/14; patient presents for follow-up. She has no issues or complaints today. She denies signs of infection. PuraPly is available and patient would like to start this today. 9/21; patient presents for follow-up. She has no issues or complaints today. She tolerated the first skin substitute placement last week under compression. 9/28; patient presents for 1 week follow-up. She has no issues or complaints today. 10/5; patient presents for 1 week follow-up. She reports taking the wrap off yesterday. She was on her feet more yesterday and developed more swelling to her left lower extremity. She denies pain. Overall she is doing well. 10/12; patient presents for 1 week follow-up. She has no issues or complaints today. Electronic Signature(s) PELAGIA, [name] ([id number]) Signed: 08/16/2021 3:11:40 PM By: Kalman Shan DO Entered By: Kalman Shan on 08/16/2021 15:08:41 EMANUELLE, HAMMERSTROM ([id number]) -------------------------------------------------------------------------------- Physical Exam Details Patient Name: ADISSON, DEAK. Date of Service: 08/16/2021 1:45 PM Medical Record Number: [id number] Patient Account Number:  [id number] Date of Birth/Sex: 11/30/1945 (75 y.o. F) Treating RN: Donnamarie Poag Primary Care Provider: [name] Other Clinician: Referring Provider: [name] Treating Provider/Extender: Yaakov Guthrie in Treatment: 18 Constitutional . Cardiovascular . Psychiatric . Notes Left lower extremity: To the distal medial aspect there is epithelialization to previous wound site. Minimal dried drainage noted. No increased swelling noted. No signs of infection. Electronic Signature(s) Signed: 08/16/2021 3:11:40 PM By: Kalman Shan DO Entered By: Kalman Shan on 08/16/2021 15:09:35 Buis, [name] ([id number]) -------------------------------------------------------------------------------- Physician Orders Details Patient Name: ISAURA, SCHILLER. Date of Service: 08/16/2021 1:45 PM Medical Record Number: [id number] Patient Account Number: [id number] Date of Birth/Sex: 1946/03/26 (75 y.o. F) Treating RN: Donnamarie Poag Primary Care Provider: [name] Other Clinician: Referring Provider: [name] Treating Provider/Extender: Yaakov Guthrie in Treatment: 31 Verbal / Phone Orders: No Diagnosis Coding Bathing/ Shower/ Hygiene o May shower with wound dressing protected with water repellent cover or cast protector. o No tub bath. Anesthetic (Use 'Patient Medications' Section for Anesthetic Order Entry) o Lidocaine applied to wound bed Edema Control - Lymphedema / Segmental Compressive Device / Other Left Lower Extremity o Optional: One layer of unna paste to top of compression wrap (to act as an anchor). o 3 Layer Compression System for Lymphedema. o Patient to wear own compression stockings. Remove compression stockings every night before going to bed and put on every morning when getting up. - right leg o Elevate, Exercise Daily and Avoid Standing for Long Periods of Time. o Elevate legs to the level of the heart and pump  ankles as often as possible o Elevate leg(s) parallel to the floor when sitting. o Compression Pump: Use compression pump on left lower  extremity for 60 minutes, twice daily. - TWICE DAILY for 1 hour o Compression Pump: Use compression pump on right lower extremity for 60 minutes, twice daily. - TWICE DAILY for 1 hour Electronic Signature(s) Signed: 08/16/2021 3:03:55 PM By: Donnamarie Poag Signed: 08/16/2021 3:11:40 PM By: Kalman Shan DO Entered By: Donnamarie Poag on 08/16/2021 14:30:13 Coker, Marsha Lenna Sciara ([id number]) -------------------------------------------------------------------------------- Problem List Details Patient Name: ARLIN, SAVONA. Date of Service: 08/16/2021 1:45 PM Medical Record Number: [id number] Patient Account Number: [id number] Date of Birth/Sex: [birth year] (75 y.o. F) Treating RN: Donnamarie Poag Primary Care Provider: [name] Other Clinician: Referring Provider: [name] Treating Provider/Extender: Yaakov Guthrie in Treatment: 18 Active Problems ICD-10 Encounter Code Description Active Date MDM Diagnosis I87.332 Chronic venous hypertension (idiopathic) with ulcer and inflammation of 04/12/2021 No Yes left lower extremity I89.0 Lymphedema, not elsewhere classified 04/12/2021 No Yes L97.221 Non-pressure chronic ulcer of left calf limited to breakdown of skin 04/12/2021 No Yes Inactive Problems Resolved Problems ICD-10 Code Description Active Date Resolved Date S81.801A Unspecified open wound, right lower leg, initial encounter 05/31/2021 05/31/2021 Electronic Signature(s) Signed: 08/16/2021 3:11:40 PM By: Kalman Shan DO Entered By: Kalman Shan on 08/16/2021 15:07:50 Kreisler, [name] ([id number]) -------------------------------------------------------------------------------- Progress Note/History and Physical Details Patient Name: SHYLO, [name]. Date of Service: 08/16/2021 1:45 PM Medical Record Number: [id number] Patient  Account Number: [id number] Date of Birth/Sex: September 05, 1946 (75 y.o. F) Treating RN: Donnamarie Poag Primary Care Provider: [name] Other Clinician: Referring Provider: [name] Treating Provider/Extender: Yaakov Guthrie in Treatment: 18 Subjective Chief Complaint Information obtained from Patient Left calf venous stasis ulceration. 11/13/16; the patient is here for a left calf recurrent ulceration which is painful and has been present for the last month 01/22/17; the patient re-presents here for recurrent difficulties with blistering and leaking fluid on her left posterior calf 12/10/18; the patient returns to clinic for a wound on the left medial calf 04/12/2021; left lower extremity wound, 05/31/21; right leg wound History of Present Illness (HPI) Pleasant 75 year old with history of chronic venous insufficiency. No diabetes or peripheral vascular disease. Left ABI 1.29. Questionable history of left lower extremity DVT. She developed a recurrent ulceration on her left lateral calf in December 2015, which she attributes to poor diet and subsequent lower extremity edema. She underwent endovenous laser ablation of her left greater saphenous vein in 2010. She underwent laser ablation of accessory branch of left GSV in April 2016 by Dr. Kellie Simmering at [facility]. She was previously wearing Unna boots, which she tolerated well. Tolerating 2 layer compression and cadexomer iodine. She returns to clinic for follow-up and is without new complaints. She denies any significant pain at this time. She reports persistent pain with pressure. No claudication or ischemic rest pain. No fever or chills. No drainage. READMISSION 11/13/16; this is a 75 year old woman who is not a diabetic. She is here for a review of a painful area on her left medial lower extremity. I note that she was seen here previously last year for wound I believe to be in the same area. At that time she had undergone previously a left  greater saphenous vein ablation by Dr. Kellie Simmering and she had a ablation of the anterior accessory branch of the left greater saphenous vein in March 2016. Seeing that the wound actually closed over. In reviewing the history with her today the ulcer in this area has been recurrent. She describes a biopsy of this area in 2009 that only showed stasis physiology. She also has a  history of today malignant melanoma in the right shoulder for which she follows with Dr. 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 Do not see that she has ever had arterial studies in the left leg. She tells me she has a follow-up with Dr. Kellie Simmering in roughly 10 days In any case she developed the reopening of this area roughly a month ago. On the background of this she describes rapidly increasing edema which has responded to Lasix 40 mg and metolazone 2.5 mg as well as the patient's lymph massage. She has been told she has both venous insufficiency and lymphedema but she cannot tolerate compression stockings 11/28/16; the patient saw Dr. Kellie Simmering recently. Per the patient he did arterial Dopplers in the office that did not show evidence of arterial insufficiency, per the patient he stated "treat this like an ordinary venous ulcer". She also saw her dermatologist Dr. Ronnald Ramp who felt that this was more of a vascular ulcer. In general things are improving although she arrives today with increasing bilateral lower extremity edema with weeping a deeper fluid through the wound on the left medial leg compatible with some degree of lymphedema 12/04/16; the patient's wound is fully epithelialized but I don't think fully healed. We will do another week of depression with Promogran and TCA however I suspect we'll be able to discharge her next week. This is a very unusual-looking wound which was initially a figure-of-eight type  wound lying on its side surrounded by petechial like hemorrhage. She has had venous ablation on this side. She apparently does not have an arterial issue per Dr. Kellie Simmering. She saw her dermatologist thought it was "vascular". Patient is definitely going to need ongoing compression and I talked about this with her today she will go to elastic therapy after she leaves here next week 12/11/16; the patient's wound is not completely closed today. She has surrounding scar tissue and in further discussion with the patient it would appear that she had ulcers in this area in 2009 for a prolonged period of time ultimately requiring a punch biopsy of this area that only showed venous insufficiency. I did not previously pickup on this part of the history from the patient. 12/18/16; the patient's wound is completely epithelialized. There is no open area here. She has significant bilateral venous insufficiency with secondary lymphedema to a mild-to-moderate degree she does not have compression stockings.. She did not say anything to me when I was in the room, she told our intake nurse that she was still having pain in this area. This isn't unusual recurrent small open area. She is going to go to elastic therapy to obtain compression stockings. 12/25/16; the patient's wound is fully epithelialized. There is no open area here. The patient describes some continued episodic discomfort in this area medial left calf. However everything looks fine and healed here. She is been to elastic therapy and caught herself 15-20 mmHg stockings, they apparently were having trouble getting 20-30 mm stockings in her size 01/22/17; this is a patient we discharged from the clinic a month ago. She has a recurrent open wound on her medial left calf. She had 15 mm support stockings. I told her I thought she needed 20-30 mm compression stockings. She tells me that she has been ill with hospitalization secondary to asthma and is been found to have  severe hypokalemia likely secondary to a combination  of Lasix and metolazone. This morning she noted blistering and leaking fluid on the posterior part of her left leg. She called our intake nurse urgently and we was saw her this afternoon. She has not had any real discomfort here. I don't know that she's been wearing any stockings on this leg for at least 2-3 days. ABIs in this clinic were 1.21 on the right and 1.3 on the left. She is previously seen vascular surgery who does not think that there is a peripheral arterial issue. 01/30/17; Patient arrives with no open wound on the left leg. She has been to elastic therapy and obtained 20-13mmhg below knee stockings and she has one on the right leg today. READMISSION 02/19/18; this Bracher is a now 75 year old patient we've had in this clinic perhaps 3 times before. I had last looked at her from January 07 December 2016 with an area on the medial left leg. We discharged her on 12/25/16 however she had to be readmitted on 01/22/17 with a recurrence. I have in my notes that we discharged her on 20-30 mm stockings although she tells me she was only wearing support hose because she cannot get stockings on predominantly related to her cervical spine surgery/issues. She has had previous ablations done by vein and vascular in [place] including a great saphenous vein ablation on the left with an anterior accessory branch ablation I think both of these were in 2016. On one of the previous visit she had a biopsy noted 2009 that was negative. She is not felt to have an arterial issue. She is not a diabetic. She does have a CHARLOTT, CALVARIO. ([id number]) history of obstructive sleep apnea hypertension asthma as well as chronic venous insufficiency and lymphedema. On this occasion she noted 2 dry scaly patch on her left leg. She tried to put lotion on this it didn't really help. There were 2 open areas.the patient has been seeing her primary physician from 02/05/18  through 02/14/18. She had Unna boots applied. The superior wound now on the lateral left leg has closed but she's had one wound that remains open on the lateral left leg. This is not the same spot as we dealt with in 2018. ABIs in this clinic were 1.3 bilaterally 02/26/18; patient has a small wound on the left lateral calf. Dimensions are down. She has chronic venous insufficiency and lymphedema. 03/05/18; small open area on the left lateral calf. Dimensions are down. Tightly adherent necrotic debris over the surface of the wound which was difficult to remove. Also the dressing [over collagen] stuck to the wound surface. This was removed with some difficulty as well. Change the primary dressing to Hydrofera Blue ready 03/12/18; small open area on the left lateral calf. Comes in with tightly adherent surface eschar as well as some adherent Hydrofera Blue. 03/19/18; open area on the left lateral calf. Again adherent surface eschar as well as some adherent Hydrofera Blue nonviable subcutaneous tissue. She complained of pain all week even with the reduction from 4-3 layer compression I put on last week. Also she had an increase in her ankle and calf measurements probably related to the same thing. 03/26/18; open area on the left lateral calf. A very small open area remains here. We used silver alginate starting last week as the Hydrofera Blue seem to stick to the wound bed. In using 4-layer compression 04/02/18; the open area in the left lateral calf at some adherent slough which I removed there is no open area here.  We are able to transition her into her own compression stocking. Truthfully I think this is probably his support hose. However this does not maintain skin integrity will be limited. She cannot put over the toe compression stockings on because of neck problems hand problems etc. She is allergic to the lining layer of juxta lites. We might be forced to use extremitease stocking should this  fail READMIT 11/24/2018 Patient is now a 75 year old woman who is not a diabetic. She has been in this clinic on at least 3 previous occasions largely with recurrent wounds on her left leg secondary to chronic venous insufficiency with secondary lymphedema. Her situation is complicated by inability to get stockings on and an allergy to neoprene which is apparently a component and at least juxta lites and other stockings. As a result she really has not been wearing any stockings on her legs. She tells [place] that roughly 2 or 3 weeks ago she started noticing a stinging sensation just above her ankle on the left medial aspect. She has been diagnosed with pseudogout and she wondered whether this was what she was experiencing. She tried to dress this with something she bought at the store however subsequently it pulled skin off and now she has an open wound that is not improving. She has been using Vaseline gauze with a cover bandage. She saw her primary doctor last week who put an Haematologist on her. ABIs in this clinic was 1.03 on the left 2/12; the area is on the left medial ankle. Odd-looking wound with what looks to be surface epithelialization but a multitude of small petechial openings. This clearly not closed yet. We have been using silver alginate under 3 layer compression with TCA 2/19; the wound area did not look quite as good this week. Necrotic debris over the majority of the wound surface which required debridement. She continues to have a multitude of what looked to be small petechial openings. She reminds [place] that she had a biopsy on this initially during her first outbreak in 2015 in [place] [facility]. She expresses concern about this being a possible melanoma. 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 I assured her that this does not look anything like melanoma. Except for the petechial reaction it  does look like a venous insufficiency area and she certainly has evidence of this on both sides 2/26; a difficult area on the left medial ankle. The patient clearly has chronic venous hypertension with some degree of lymphedema. The odd thing about the area is the small petechial hemorrhages. I am not really sure how to explain this. This was present last time and this is not a compression injury. We have been using Hydrofera Blue which I changed to last week 3/4; still using Hydrofera Blue. Aggressive debridement today. She does not have known arterial issues. She has seen Dr. Kellie Simmering at [facility] vein and vascular and and has an ablation on the left. [Anterior accessory branch of the greater saphenous]. From what I remember they did not feel she had an arterial issue. The patient has had this area biopsied in 2009 at [facility] dermatology and by her recollection they said this was "stasis". She is also follow-up with dermatology locally who thought that this was more of a vascular issue 3/11; using Hydrofera Blue. Aggressive debridement today. She does not have an arterial issue. We are using 3 layer compression although we may need to  go to 4. The patient has been in for multiple changes to her wrap since I last saw her a week ago. She says that the area was leaking. I do not have too much more information on what was found 01/19/19 on evaluation today patient was actually being seen for a nurse visit when unfortunately she had the area on her left lateral lower extremity as well as weeping from the right lower extremity that became apparent. Therefore we did end up actually seeing her for a full visit with myself. She is having some pain at this site as well but fortunately nothing too significant at this point. No fevers, chills, nausea, or vomiting noted at this time. 3/18-Patient is back to the clinic with the left leg venous leg ulcer, the ulcer is larger in size, has a surface that is densely  adherent with fibrinous tissue, the Hydrofera Blue was used but is densely adherent and there was difficulty in removing it. The right lower extremity was also wrapped for weeping edema. Patient has a new area over the left lateral foot above the malleolus that is small and appears to have no debris with intact surrounding skin. Patient is on increased dose of Lasix also as a means to edema management 3/25; the patient has a nonhealing venous ulcer on the medial left leg and last week developed a smaller area on the lateral left calf. We have been using Hydrofera Blue with a contact layer. 4/1; no major change in these wounds areas. Left medial and more recently left lateral calf. I tried Iodoflex last week to aid in debridement she did not tolerate this. She stated her pain was terrible all week. She took the top layer of the 4 layer compression off. 4/8; the patient actually looks somewhat better in terms of her more prominent left lateral calf wound. There is some healthy looking tissue here. She is still complaining of a lot of discomfort. 4/15; patient in a lot of pain secondary to sciatica. She is on a prednisone taper prescribed by her primary physician. She has the 2 areas one on the left medial and more recently a smaller area on the left lateral calf. Both of these just above the malleoli 4/22; her back pain is better but she still states she is very uncomfortable and now feels she is intolerant to the [employer]. No real change in the wounds we have been using Sorbact. She has been previously intolerant to Iodoflex. There is not a lot of option about what we can use to debride this wound under compression that she no doubt needs. sHe states Ultram no longer works for her pain 4/29; no major change in the wounds slightly increased depth. Surface on the original medial wound perhaps somewhat improved however the more recent area on the lateral left ankle is 100% covered in very adherent  debris we have been using Sorbact. She tolerates 4 layer compression well and her edema control is a lot better. She has not had to come in for a nurse check 5/6; no major change in the condition of the wounds. She did consent to debridement today which was done with some difficulty. Continuing Sorbact. She did not tolerate Iodoflex. She was in for a check of her compression the day after we wrapped her last week this was adjusted but [name], [name]. ([id number]) nothing much was found 5/13; no major change in the condition or area of the wounds. I was able to get a fairly  aggressive debridement done on the lateral left leg wound. Even using Sorbact under compression. She came back in on Friday to have the wrap changed. She says she felt uncomfortable on the lateral aspect of her ankle. She has a long history of chronic venous insufficiency including previous ablation surgery on this side. 5/20-Patient returns for wounds on left leg with both wounds covered in slough, with the lateral leg wound larger in size, she has been in 3 layer compression and felt more comfortable, she describes pain in ankle, in leg and pins and needles in foot, and is about to try Pamelor for this 6/3; wounds on the left lateral and left medial leg. The area medially which is the most recent of the 2 seems to have had the largest increase in dimensions. We have been using Sorbac to try and debride the surface. She has been to see orthopedics they apparently did a plain x-ray that was indeterminant. Diagnosed her with neuropathy and they have ordered an MRI to determine if there is underlying osteomyelitis. This was not high on my thought list but I suppose it is prudent. We have advised her to make an appointment with vein and vascular in [place]. She has a history of a left greater saphenous and accessory vein ablations I wonder if there is anything else that can be done from a surgical point of view to help in these  difficult refractory wounds. We have previously healed this wound on one occasion but it keeps on reopening [medial side] 6/10; deep tissue culture I did last week I think on the left medial wound showed both moderate E. coli and moderate staph aureus [MSSA]. She is going to require antibiotics and I have chosen Augmentin. We have been using Sorbact and we have made better looking wound surface on both sides but certainly no improvement in wound area. She was back in last Friday apparently for a dressing changes the wrap was hurting her outer left ankle. She has not managed to get a hold of vein and vascular in [place]. We are going to have to make her that appointment 6/17; patient is tolerating the Augmentin. She had an MRI that I think was ordered by orthopedic surgeon this did not show osteomyelitis or an abscess did suggest cellulitis. We have been using Sorbact to the lateral and medial ankles. We have been trying to arrange a follow-up appointment with vein and vascular in [place] or did her original ablations. We apparently an area sent the request to vein and vascular in [facility] 6/24; patient has completed the Augmentin. We do not yet have a vein and vascular appointment in [place]. I am not sure what the issue is here we have asked her to call tomorrow. We are using Sorbact. Making some improvements and especially the medial wound. Both surfaces however look better medial and lateral. 7/1; the patient has been in contact with vein and vascular in [place] but has not yet received an appointment. Using Sorbact we have gradually improve the wound surface with no improvement in surface area. She is approved for Apligraf but the wound surface still is not completely viable. She has not had to come in for a dressing change 7/8; the patient has an appointment with vein and vascular on 7/31 which is a Friday afternoon. She is concerned about getting back here for [place] to dress her  wounds. I think it is important to have them goal for her venous reflux/history of ablations etc. to see if  anything else can be done. She apparently tested positive for 1 of the blood tests with regards to lupus and saw a rheumatologist. He has raised the issue of vasculitis again. I have had this thought in the past however the evidence seems overwhelming that this is a venous reflux etiology. If the rheumatologist tells me there is clinical and laboratory investigation is positive for lupus I will rethink this. 7/15; the patient's wound surfaces are quite a bit better. The medial area which was her original wound now has no depth although the lateral wound which was the more recent area actually appears larger. Both with viable surfaces which is indeed better. Using Sorbact. I wanted to use Apligraf on her however there is the issue of the vein and vascular appointment on 7/31 at 2:00 in the afternoon which would not allow her to get back to be rewrapped and they would no doubt remove the graft 7/22; the patient's wound surfaces have moderate amount of debris although generally look better. The lateral one is larger with 2 small satellite areas superiorly. We are waiting for her vein and vascular appointment on 7/31. She has been approved for Apligraf which I would like to use after th 7/29; wound surfaces have improved no debridement is required we have been using Sorbact. She sees vein and vascular on Friday with this so question of whether anything can be done to lessen the likelihood of recurrence and/or speed the healing of these areas. She is already had previous ablations. She no doubt has severe venous hypertension 8/5-Patient returns at 1 week, she was in [place] for 3 days by her podiatrist, we have been using so backed to the wound, she has increased pain in both the wounds on the left lower leg especially the more distal one on the lateral aspect 8/12-Patient returns at 1 week and  she is agreeable to having debridement in both wounds on her left leg today. We have been using Sorbact, and vascular studies were reviewed at last visit 8/19; the patient arrives with her wounds fairly clean and no debridement is required. We have used Sorbact which is really done a nice job in cleaning up these very difficult wound surfaces. The patient saw Dr. Donzetta Matters of vascular surgery on 7/31. He did not feel that there was an arterial component. He felt that her treated greater saphenous vein is adequately addressed and that the small saphenous vein did not appear to be involved significantly. She was also noted to have deep venous reflux which is not treatable. Dr. Donzetta Matters mentioned the possibility of a central obstructive component leading to reflux and he offered her central venography. She wanted to discuss this or think about it. I have urged her to go ahead with this. She has had recurrent difficult wounds in these areas which do heal but after months in the clinic. If there is anything that can be done to reduce the likelihood of this I think it is worth it. 9/2 she is still working towards getting follow-up with Dr. Donzetta Matters to schedule her CT. Things are quite a bit worse venography. I put Apligraf on 2 weeks ago on both wounds on the medial and lateral part of her left lower leg. She arrives in clinic today with 3 superficial additional wounds above the area laterally and one below the wound medially. She describes a lot of discomfort. I think these are probably wrapped injuries. Does not look like she has cellulitis. 07/20/2019 on evaluation today patient  appears to be doing somewhat poorly in regard to her lower extremity ulcers. She in fact showed signs of erythema in fact we may even be dealing with an infection at this time. Unfortunately I am unsure if this is just infection or if indeed there may be some allergic reaction that occurred as a result of the Apligraf application. With that  being said that would be unusual but nonetheless not impossible in this patient is one who is unfortunately allergic to quite a bit. Currently we have been using the Sorbact which seems to do as well as anything for her. I do think we may want to obtain a culture today to see if there is anything showing up there that may need to be addressed. 9/16; noted that last week the wounds look worse in 1 week follow-up of the Apligraf. Using Sorbact as of 2 days ago. She arrives with copious amounts of drainage and new skin breakdown on the back of the left calf. The wounds arm more substantial bilaterally. There is a fair amount of swelling in the left calf no overt DVT there is edema present I think in the left greater than right thigh. She is supposed to go on 9/28 for CT venography. The wounds on the medial and lateral calf are worse and she has new skin breakdown posteriorly at least new for me. This is almost developing into a circumferential wound area The Apligraf was taken off last week which I agree with things are not going in the right direction a culture was done we do not have that back yet. She is on Augmentin that she started 2 days ago 9/23; dressing was changed by her nurses on Monday. In general there is no improvement in the wound areas although the area looks less angry than last week. She did get Augmentin for MSSA cultured on the 14th. She still appears to have too much swelling in the left leg even with 3 layer compression 9/30; the patient underwent her procedure on 9/28 by Dr. Donzetta Matters at vascular and vein specialist. She was discovered to have the common iliac vein measuring 12.2 mm but at the level of L4-L5 measured 3 mm. After stenting it measured 10 mm. 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 Rouleaux flow in the common femoral and femoral vein was observed much improved after stenting. We are using silver alginate to the wounds on the medial and lateral ankle on  the left. 4 layer compression [name], [name] ([id number]) 10/7; the patient had fluid swelling around her knee and 4 layer compression. At the advice of vein and vascular this was reduced to 3 layer which she is tolerating better. We have been using silver alginate under 3 layer compression since last Friday 10/14; arrives with the areas on the left ankle looking a lot better. Inflammation in the area also a lot better. She came in for a nurse check on 10/9 10/21; continued nice improvement. Slight improvements in surface area of both the medial and lateral wounds on the left. A lot of the satellite lesions in the weeping erythema around these from stasis dermatitis is resolved. We have been using silver alginate 10/28; general improvement in the entire wound areas although not a lot of change in dimensions the wound certainly looks better. There is a lot less in terms of venous inflammation. Continue silver alginate this week however look towards Hydrofera Blue next week 11/4; very adherent debris on the medial wound left  wound is not as bad. We have been using silver alginate. Change to [facility] today 11/11; very adherent debris on both wound areas. She went to vein and vascular last week and follow-up they put in [place] boot on this today. He says the [facility] was adherent. Wound is definitely not as good as last week. Especially on the left there the satellite lesions look more prominent 11/18; absolutely no better. erythema on lateral aspect with tenderness. 09/30/2019 on evaluation today patient appears to actually be doing better. Dr. Dellia Nims did put her on doxycycline last week which I do believe has helped her at this point. Fortunately there is no signs of active infection at this time. No fevers, chills, nausea, vomiting, or diarrhea. I do believe he may want extend the doxycycline for 7 additional days just to ensure everything does completely cleared up the patient is  in agreement with that plan. Otherwise she is going require some sharp debridement today 12/2; patient is completing a 2-week course of doxycycline. I gave her this empirically for inflammation as well as infection when I last saw her 2 weeks ago. All of this seems to be better. She is using silver alginate she has the area on the medial aspect of the larger area laterally and the 2 small satellite regions laterally above the major wound. 12/9; the patient's wound on the left medial and left lateral calf look really quite good. We have been using silver alginate. She saw vein and vascular in follow-up on 10/09/2019. She has had a previous left greater saphenous vein ablation by Dr. Oscar La in 2016. More recently she underwent a left common iliac vein stent by Dr. 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 The swelling is improved and certainly the wounds have improved. The patient shows [place] today area on the right medial calf there is almost no wound but leaking lymphedema. She says she start this started 3 or 4 days ago. She did not traumatize it. It is not painful. She does not wear compression on that side 12/16; the patient continues to do well laterally. Medially still requiring debridement. The area on the right calf did not materialize to anything and is not currently open. We wrapped this last time. She has support stockings for that leg although I am not sure they are going to provide adequate compression 12/23; the lateral wound looks stable. Medially still requiring debridement for tightly adherent fibrinous debris. We've been using silver alginate. Surface area not any different 12/30; neither wound is any better with regards to surface and the area on the left lateral is larger. I been using silver alginate to the left lateral which look quite good last week and Sorbact to the left medial 11/11/2019. Lateral wound area actually looks better and somewhat smaller. Medial still  requires a very aggressive debridement today. We have been using Sorbact on both wound areas 1/13; not much better still adherent debris bilaterally. I been using Sorbact. She has severe venous hypertension. Probably some degree of dermal fibrosis distally. I wonder whether tighter compression might help and I am going to try that today. We also need to work on the bioburden 1/20; using Sorbact. She has severe venous hypertension status post stent placement for pelvic vein compression. We applied gentamicin last time to see if we could reduce bioburden I had some discussion with her today about the use of pentoxifylline. This is occasionally used in this setting for wounds with refractory venous insufficiency. However  this interacts with Plavix. She tells me that she was put on this after stent placement for 3 months. She will call Dr. Claretha Cooper office to discuss 1/27; we are using gentamicin under Sorbact. 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 She has a stent. Wound medially is measuring smaller this week. Laterally measuring slightly larger although she has some satellite lesions superiorly 2/3; gentamicin under Sorbact under 4-layer compression. 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 She has a stent on Plavix. Her wounds are measuring smaller this week. More substantially laterally where there is a satellite lesion superiorly. 2/10; gentamicin under Sorbac. 4-layer compression. Patient communicated with Dr. Donzetta Matters at vein and vascular in [place]. He is okay with the patient coming off Plavix I will therefore start her on pentoxifylline for a 1 month trial. In general her wounds look better today. I had some concerns about swelling in the left thigh however she measures 61.5 on the right and 63 on the mid thigh which does not suggest there is any difficulty. The patient is not describing any pain. 2/17; gentamicin under Sorbac 4-layer  compression. She has been on pentoxifylline for 1 week and complains of loose stool. No nausea she is eating and drinking well 2/24; the patient apparently came in 2 days ago for a nurse visit when her wrap fell down. Both areas look a little worse this week macerated medially and satellite lesions laterally. Change to silver alginate today 3/3; wounds are larger today especially medially. She also has more swelling in her foot lower leg and I even noted some swelling in her posterior thigh which is tender. I wonder about the patency of her stent. Fortuitously she sees Dr. Claretha Cooper group on Friday 3/10; Mrs. Mckenna was seen by vein and vascular on 3/5. The patient underwent ultrasound. There was no evidence of thrombosis involving the IVC no evidence of thrombosis involving the right common iliac vein there is no evidence of thrombosis involving the right external iliac vein the left external vein is also patent. The right common iliac vein stent appears patent bilateral common femoral veins are compressible and appear patent. I was concerned about the left common iliac stent however it looks like this is functional. She has some edema in the posterior thigh that was tender she still has that this week. I also note they had trouble finding the pulses in her left foot and booked her for an ABI baseline in 4 weeks. She will follow up in 6 months for repeat IVC duplex. The patient stopped the pentoxifylline because of diarrhea. It does not look like that was being effective in any case. I have advised her to go back on her aspirin 81 mg tablet, vascular it also suggested this 3/17; comes in today with her wound surfaces a lot better. The excoriations from last week considerably better probably secondary to the TCA. We have been using silver alginate 3/24; comes in today with smaller wounds both medially and laterally. Both required debridement. There are 2 small satellite areas  superiorly laterally. She also has a very odd bandlike area in the mid calf almost looking like there was a weakness in the wrap in a localized area. I would write this off as being this however anteriorly she has a small raised ballotable area that is very tender almost reminiscent of an abscess but there was no [name], [name]. ([id number]) obvious purulent surface to it. 02/04/20 upon evaluation today patient appears to be doing fairly  well in regard to her wounds today. Fortunately there is no signs of active infection at this time. No fevers, chills, nausea, vomiting, or diarrhea. She has been tolerating the dressing changes without complication. Fortunately I feel like she is showing signs of improvement although has been sometime since have seen her. Nonetheless the area of concern that Dr. Dellia Nims had last week where she had possibly an area of the wrap that was we can allow the leg to bulge appears to be doing significantly better today there is no signs of anything worsening. 4/7; the patient's wounds on her medial and lateral left leg continue to contract. We have been using a regular alginate. Last week she developed an area on the right medial lower leg which is probably a venous ulcer as well. 4/14; the wounds on her left medial and lateral lower leg continue to contract. Surface eschar. We have been using regular alginate. The area on the right medial lower leg is closed. We have been putting both legs under 4-layer contraction. The patient went back to see vein and vascular she had arterial studies done which were apparently "quite good" per the patient although I have not read their notes I have never felt she had an arterial issue. 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 This is been longstanding and refractory to exercise, leg elevation and longstanding use of compression wraps in our clinic as well as compression stockings on the times  we have been able to get these to heal 4/21; we thought she actually might be close this week however she arrives in clinic with a lot of edema in her upper left calf and into her posterior thigh. This is been an intermittent problem here. She says the wrap fell down but it was replaced with a nurse visit on Monday. We are using calcium alginate to the wounds and the wound sizes there not terribly larger than last week but there is a lot more edema 4/28; again wound edges are smaller on both sides. Her edema is better controlled than last time. She is obtained her compression pumps from [employer] although they have not been to her home to set these up. 5/5; left medial and left lateral both look stable. I am not sure the medial is any smaller. We have been using calcium alginate under 4-layer compression. She had an area on the right medial. This was eschared today. We have been wrapping this as well. She does not tolerate external compression stockings due to a history of various contact allergies. She has her compression pumps however the representative from the company is coming on her to show her how to use these tomorrow 5/19; patient with severe chronic venous insufficiency secondary to central venous disease. She had a stent placed in her left common iliac vein. She has done better since but still difficult to control wounds. She comes in today with nothing open on the right leg. Her areas on the left medial and left lateral are just about closed. We are using calcium alginate under 4-layer compression. She is using her external compression pumps at home She only has 15-20 support stockings. States she cannot get anything tighter than that on. 03/30/20-Patient returns at 1 week, the wounds on the left leg are both slightly bigger, the last week she was on 3 layer compression which started to slide down. She is starting to use her lymphedema pumps although she stated on 1 day her right  ankle started to swell up and she have to stop that day. Unfortunately the open area seem to oscillate between improving to the point of healing and then flaring up all to do with effectiveness of compression or lack of due to the left leg topography not keeping the compression wraps from rolling down 6/2 patient comes in with a 15/20 mmHg stocking on the right leg. She tells me that she developed a lot of swelling in her ankles she saw orthopedics she was felt to possibly be having a flare of pseudogout versus some other type of arthritis. She was put on steroids for a respiratory issue so that helps with the inflammation. She has not been using the pumps all week. She thinks the left thigh is more swollen than usual and I would agree with that. She has an appointment with Dr. Donzetta Matters 9 days or so from now 6/9; both wounds on the left medial and left lateral are smaller. We have been using calcium alginate under compression. She does not have an open wound on the right leg she is using a stocking and her compression pumps things are going well. She has an appointment with Dr. Donzetta Matters with regards to her stent in the left common iliac vein 6/16; the wounds on the left medial and left lateral ankle continues to contract. The patient saw Dr. Donzetta Matters and I think he seems satisfied. Ordered follow-up venous reflux studies on both sides in September. Cautioned that she may need thigh-high stockings. She has been using calcium alginate under compression on the left and her own stocking on the right leg. She tells [place] there are no open wounds on the right 6/23; left lateral is just about closed. Medial required debridement today. We have been using calcium alginate. Extensive discussion about the compression pumps she is only using these on 25 mmHg states she could not take 40 or 30 when the wrap came out to her home to demonstrate these. He said they should not feel tight 6/30; the left lateral wound has a slight  amount of eschar. . The area medially is about the same using Hydrofera Blue. 7/7; left lateral wound still has some eschar. I will remove this next week may be closed. The area medially is very small using Hydrofera Blue with improvement. Unfortunately the stockings fell down. Unfortunately the blisters have developed at the edge of where the wrap fell. When this happened she says her legs hurt she did not use her pumps. We are not open Monday for her to come in and change the wraps and she had an appointment yesterday. She also tells me that she is going to have an MRI of her back. She is having pain radiating into her left anterior leg she thinks her from an L5 disc. She saw Dr. Ellene Route of neurosurgery 7/14; the area on the left lateral ankle area is closed. Still a small area medially however it looks better as well. We have been using Hydrofera Blue under 4-layer compression 7/21; left lateral ankle is still closed however her wound on the medial left calf is actually larger. This is probably because Hydrofera Blue got stuck to the wound. She came in for a nurse change on Friday and will do that again this week I was concerned about the amount of swelling that she had last week however she is using her compression pumps twice a day and the swelling seems well controlled 7/28; remaining wound on the left medial lower leg is  smaller. We have been using moistened silver collagen under compression she is coming back for a nurse visit. For reasons that were not really clear she was just keeping her legs elevated and not using her compression pumps. I have asked her to use the compression pumps. She does not have any wounds on the right leg 06/15/20-Patient returns at 2 weeks, her LLE edema is worse and she developed a blister wound that is new and has bigger posterior calf wound on right, we are using Prisma with pad, 4 layer compression. she has been on lasix 40 mg daily 8/18; patient arrives today  with things a lot worse than I remember from a few weeks ago. She was seen last week. Noted that her edema was worse and that she now had a left lateral wound as well as deteriorating edema in the medial and posterior part of the lower leg. She says she is using his or her external compression pumps once a day although I wonder about the compliance. 8/25; weeping area on the right medial lower leg. This had actually gotten a small localized area of her compression stocking wet. On the left side there is a large denuded area on the posterior medial lower leg and smaller area on the lateral. This was not the original areas that we dealt with. 9/1 the patient's wound on the left leg include the left lateral and left posterior. Larger superficial wounds weeping. She has very poor edema control. Tender localized edema in the left lower medial ankle/heel probably because of localized wrap issues. She freely admits she is not using Tercer[employer], [place] ([id number]) the compression pumps. She has been up on her feet a lot. She thinks the hydrofera blue is contributing to the pain she is experiencing.. This is a complaint that I have occasionally heard 9/8; really not much improvement. The patient is still complaining of a lot of pain particularly when she uses compression pumps. I switched her to silver alginate last time because she found the Hydrofera Blue to be irritating. I don't hear much difference in her description with the silver alginate. 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 She has follow-up with Dr. Donzetta Matters I think for ultrasound next month 9/15; quite a bit of improvement today. We have less edema and more epithelialization in both of her wound areas on the left medial and left lateral calf. These are not the site of her original wounds in this area. She says she has been using her compression pumps for 30 minutes twice  a day, there pain issues that never quite understood. Silver alginate as the primary dressing 9/22; continued improvement. Both areas medially and laterally still have a small open area there is some eschar. She continues to complain of left medial ankle pain. Swelling in the leg is in much better condition. We have been using silver alginate 9/29; continued improvement. Both areas medially and laterally in the left calf look as though they are close some minor surface eschar but I think this is epithelialized. She comes in today saying she has a ruptured disc at L4-L5 cannot bend over to put on her stockings. 10/6; patient comes in today with no open wounds on either leg. However her edema on the left leg in the upper one third of the lower leg is poorly controlled nonpitting. She says that she could not use the pumps for 2  days and then she has been using the last couple of days. It is not clear to me she has been able to get her stocking on. She has back problems. Mrs. Cranford has severe chronic venous insufficiency with secondary lymphedema. Her venous insufficiency is partially centrally mediated and that she is now post stent in the left common iliac vein. [name] Thurner's syndrome/physiology]. She follows up with them on 10/15. She wears 20/30 below-knee stockings. She is supposed to use compression pumps at home although I think her compliance about with this is been less than 100%. I have asked her to use these 3 times a day. Finally I think she has lipodermatosclerosis in the left lower leg with an inverted bottle sign. It is been a major problem controlling the edema in the left leg. The right leg we have had wounds on but not as significant a problem is on the left READMISSION 04/12/2021 Mrs. Furness is a 75 year old woman we know well in this clinic. She has severe chronic venous insufficiency. 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 I believe she  has had bilateral greater saphenous vein ablation in the past as well. She tells me that this wound opened sometime in March. She had a fall and thinks it was initially abrasion. She developed areas she describes as little blisters on the anterior part of her leg and she saw dermatology and was treated for methicillin staph aureus with several rounds of antibiotics. She has been using support stockings on the left leg and says this is the only thing she can get on. Her compression pump use maybe once a day she says if she did not use one she use the other. She comes in today with incredible swelling in the left leg with a wound on the left posterior calf. She has been using Neosporin to this previously a hydrocolloid. 6/15; patient arrives back for 1 week follow-up.Marland Kitchen Apparently her wrap fell down she did not call us to replace this. He has poor edema control. She only uses her compression pumps once a day 6/29; patient presents for 1 week follow-up. She has tolerated the compression wrap well. We have been using Iodoflex under the wrap. She has no issues or complaints today. 7/6; patient presents for 1 week follow-up. She states that the compression wrap rolled down her leg 4 days ago. She has been trying to keep the area covered but has no dressings at home to use. She denies signs of infection. 7/13; patient presents for 1 week follow-up. She states that her compression wrap rolled down her right leg and she called our office and had it placed a few days ago. She has been tolerating the current wrap well. She states that the Iodoflex is causing a burning sensation. She denies signs of infection. 7/27; patient presents for 1 week follow-up She has tolerated the compression wrap well with Hydrofera Blue underneath. She has now developed a wound to her right lower extremity. She reports having a culture done To this area by her dermatologist that she reports is negative. She currently denies signs of  infection. 8/30; patient presents for 1 week follow-up. On the left side she has tolerated the compression wrap well with Hydrofera Blue underneath. She reports some discomfort to her right lower extremity with the 3 layer compression. She currently denies signs of infection. 06/14/2021 upon evaluation today patient's wound actually appears to be doing decently well based on what  I am seeing currently. She actually has 2 areas 1 on the left distal/posterior lower leg and the other on the right lower leg. Subsequently the measurements are roughly the same may be slightly smaller but in general have not made any trend towards getting worse which is great news. 06/23/2021 upon evaluation today patient appears to be doing pretty well in regard to her wounds. On the right she is having difficulty with sciatic pain and this subsequently has led to her taking the wrap off over the last week her leg is more swollen she is also been on prednisone for 10 days. With that being said I think we may want to just use a compression sock on the left that way she will not have to worry about the compression wrap being in place that she cannot get off. With that being said I do believe as well that the patient is going to need to continue with the wrapping on the left we will also need to do a little bit of sharp debridement today. 8/24; patient presents for 1 week follow-up. She has no issues or complaints today. She denies signs of infection. 8/31; patient presents for 1 week follow-up. She has no issues or complaints today. She has tolerated the compression wrap well on the left lower extremity. She denies signs of infection. 9/7; patient presents for follow-up. She reports that the compression wrap rolled down slightly on her right lower extremity. She reports tenderness to the wound bed. She denies increased warmth or erythema to the surrounding skin. She overall feels well. 9/14; patient presents for follow-up. She  has no issues or complaints today. She denies signs of infection. PuraPly is available and patient would like to start this today. 9/21; patient presents for follow-up. She has no issues or complaints today. She tolerated the first skin substitute placement last week under compression. 9/28; patient presents for 1 week follow-up. She has no issues or complaints today. [name], [name] ([id number]) 10/5; patient presents for 1 week follow-up. She reports taking the wrap off yesterday. She was on her feet more yesterday and developed more swelling to her left lower extremity. She denies pain. Overall she is doing well. 10/12; patient presents for 1 week follow-up. She has no issues or complaints today. Patient History Information obtained from Patient. Family History Cancer - Mother,Paternal Grandparents, Hypertension - Mother,Father, Kidney Disease - Paternal Grandparents, Lung Disease - Paternal Grandparents, No family history of Diabetes, Heart Disease, Hereditary Spherocytosis, Seizures, Stroke, Thyroid Problems, Tuberculosis. Social History Former smoker - quit at age 2 - ended on 04/19/1966, Marital Status - Divorced, Alcohol Use - Never, Drug Use - No History, Caffeine Use - Daily. Medical History Eyes Patient has history of Cataracts Denies history of Glaucoma, Optic Neuritis Ear/Nose/Mouth/Throat Denies history of Chronic sinus problems/congestion, Middle ear problems Hematologic/Lymphatic Denies history of Anemia, Hemophilia, Human Immunodeficiency Virus, Lymphedema, Sickle Cell Disease Respiratory Patient has history of Asthma, Sleep Apnea Denies history of Aspiration, Chronic Obstructive Pulmonary Disease (COPD), Pneumothorax, Tuberculosis Cardiovascular Patient has history of Deep Vein Thrombosis - LLE 2010, Hypertension, Peripheral Venous Disease Denies history of Angina, Arrhythmia, Congestive Heart Failure, Coronary Artery Disease, Hypotension, Myocardial Infarction,  Peripheral Arterial Disease, Phlebitis, Vasculitis Gastrointestinal Denies history of Cirrhosis , Colitis, Crohn s, Hepatitis A, Hepatitis B, Hepatitis C Endocrine Denies history of Type I Diabetes, Type II Diabetes Genitourinary Denies history of End Stage Renal Disease Immunological Denies history of Lupus Erythematosus, Raynaud s, Scleroderma Integumentary (Skin) Denies history of History  of Burn, History of pressure wounds Musculoskeletal Patient has history of Osteoarthritis - neck Denies history of Gout, Rheumatoid Arthritis, Osteomyelitis Neurologic Denies history of Dementia, Neuropathy, Quadriplegia, Paraplegia, Seizure Disorder Oncologic Patient has history of Received Chemotherapy - interfeon immunotherapy, Received Radiation Psychiatric Denies history of Anorexia/bulimia, Confinement Anxiety Hospitalization/Surgery History - Melanoma left shoulder. Medical And Surgical History Notes Constitutional Symptoms (General Health) Vein ablation LLE 2010 Vascular Sx ( vein ablationo) LLE 02/2015 Eyes Horners syndrome Genitourinary Kidney stones Neurologic ct scan showed swollen lymph nodes Oncologic Melanoma (R) shoulder 07/2010; (R) axillary lymphnode removal Objective Culhane, Kelsa J. ([id number]) Constitutional Vitals Time Taken: 2:06 PM, Height: 63 in, Weight: 212 lbs, BMI: 37.6, Temperature: 98.8 F, Pulse: 80 bpm, Respiratory Rate: 18 breaths/min, Blood Pressure: 158/81 mmHg. General Notes: Left lower extremity: To the distal medial aspect there is epithelialization to previous wound site. Minimal dried drainage noted. No increased swelling noted. No signs of infection. Integumentary (Hair, Skin) Wound #13 status is Healed - Epithelialized. Original cause of wound was Gradually Appeared. The date acquired was: 02/24/2021. The wound has been in treatment 18 weeks. The wound is located on the Left,Distal,Posterior Lower Leg. The wound measures 0cm length x 0cm width x  0cm depth; 0cm^2 area and 0cm^3 volume. There is no tunneling or undermining noted. There is a none present amount of drainage noted. There is no granulation within the wound bed. There is no necrotic tissue within the wound bed. Assessment Active Problems ICD-10 Chronic venous hypertension (idiopathic) with ulcer and inflammation of left lower extremity Lymphedema, not elsewhere classified Non-pressure chronic ulcer of left calf limited to breakdown of skin Patient's wound continues to be closed with some dried drainage. We will wrap for 1 more week to assure there are no issues. After that we will try without the compression wrap and her own compression stockings. No signs of infection on exam. Follow-up in 1 week Procedures There was a Three Layer Compression Therapy Procedure by Donnamarie Poag, RN. Post procedure Diagnosis Wound #: Same as Pre-Procedure Plan Bathing/ Shower/ Hygiene: May shower with wound dressing protected with water repellent cover or cast protector. No tub bath. Anesthetic (Use 'Patient Medications' Section for Anesthetic Order Entry): Lidocaine applied to wound bed Edema Control - Lymphedema / Segmental Compressive Device / Other: Optional: One layer of unna paste to top of compression wrap (to act as an anchor). 3 Layer Compression System for Lymphedema. Patient to wear own compression stockings. Remove compression stockings every night before going to bed and put on every morning when getting up. - right leg Elevate, Exercise Daily and Avoid Standing for Long Periods of Time. Elevate legs to the level of the heart and pump ankles as often as possible Elevate leg(s) parallel to the floor when sitting. Compression Pump: Use compression pump on left lower extremity for 60 minutes, twice daily. - TWICE DAILY for 1 hour Compression Pump: Use compression pump on right lower extremity for 60 minutes, twice daily. - TWICE DAILY for 1 hour 1. Compression wrap 2.  Follow-up in 1 week Electronic Signature(s) Signed: 08/16/2021 3:11:40 PM By: Hewitt Blade ([id number]) Entered By: Kalman Shan on 08/16/2021 15:10:36 KAYLON, LAROCHE ([id number]) -------------------------------------------------------------------------------- ROS/PFSH Details Patient Name: NATASSJA, OLLIS. Date of Service: 08/16/2021 1:45 PM Medical Record Number: [id number] Patient Account Number: [id number] Date of Birth/Sex: 24-Mar-1946 (75 y.o. F) Treating RN: Donnamarie Poag Primary Care Provider: [name] Other Clinician: Referring Provider: [name] Treating Provider/Extender: Yaakov Guthrie in Treatment: [phone]  Label Progress Note Print Version as History and Physical for this encounter Information Obtained From Patient Constitutional Symptoms (General Health) Medical History: Past Medical History Notes: Vein ablation LLE 2010 Vascular Sx ( vein ablationo) LLE 02/2015 Eyes Medical History: Positive for: Cataracts Negative for: Glaucoma; Optic Neuritis Past Medical History Notes: Horners syndrome Ear/Nose/Mouth/Throat Medical History: Negative for: Chronic sinus problems/congestion; Middle ear problems Hematologic/Lymphatic Medical History: Negative for: Anemia; Hemophilia; Human Immunodeficiency Virus; Lymphedema; Sickle Cell Disease Respiratory Medical History: Positive for: Asthma; Sleep Apnea Negative for: Aspiration; Chronic Obstructive Pulmonary Disease (COPD); Pneumothorax; Tuberculosis Cardiovascular Medical History: Positive for: Deep Vein Thrombosis - LLE 2010; Hypertension; Peripheral Venous Disease Negative for: Angina; Arrhythmia; Congestive Heart Failure; Coronary Artery Disease; Hypotension; Myocardial Infarction; Peripheral Arterial Disease; Phlebitis; Vasculitis Gastrointestinal Medical History: Negative for: Cirrhosis ; Colitis; Crohnos; Hepatitis A; Hepatitis B; Hepatitis C Endocrine Medical  History: Negative for: Type I Diabetes; Type II Diabetes Genitourinary Medical History: Negative for: End Stage Renal Disease Past Medical History Notes: Kidney stones DAYLAH, SAYAVONG ([id number]) Immunological Medical History: Negative for: Lupus Erythematosus; Raynaudos; Scleroderma Integumentary (Skin) Medical History: Negative for: History of Burn; History of pressure wounds Musculoskeletal Medical History: Positive for: Osteoarthritis - neck Negative for: Gout; Rheumatoid Arthritis; Osteomyelitis Neurologic Medical History: Negative for: Dementia; Neuropathy; Quadriplegia; Paraplegia; Seizure Disorder Past Medical History Notes: ct scan showed swollen lymph nodes Oncologic Medical History: Positive for: Received Chemotherapy - interfeon immunotherapy; Received Radiation Past Medical History Notes: Melanoma (R) shoulder 07/2010; (R) axillary lymphnode removal Psychiatric Medical History: Negative for: Anorexia/bulimia; Confinement Anxiety HBO Extended History Items Eyes: Cataracts Immunizations Pneumococcal Vaccine: Received Pneumococcal Vaccination: Yes Received Pneumococcal Vaccination On or After 60th Birthday: No Immunization Notes: tetanus shot w/in the last 5 years per pt Implantable Devices None Hospitalization / Surgery History Type of Hospitalization/Surgery Melanoma left shoulder Family and Social History Cancer: Yes - Mother,Paternal Grandparents; Diabetes: No; Heart Disease: No; Hereditary Spherocytosis: No; Hypertension: Yes - Mother,Father; Kidney Disease: Yes - Paternal Grandparents; Lung Disease: Yes - Paternal Grandparents; Seizures: No; Stroke: No; Thyroid Problems: No; Tuberculosis: No; Former smoker - quit at age 82 - ended on 04/19/1966; Marital Status - Divorced; Alcohol Use: Never; Drug Use: No History; Caffeine Use: Daily; Financial Concerns: No; Food, Clothing or Shelter Needs: No; Support System Lacking: No; Transportation Concerns:  No Electronic Signature(s) Signed: 08/16/2021 3:11:40 PM By: Kalman Shan DO Signed: 08/16/2021 3:53:36 PM By: Donnamarie Poag Entered By: Kalman Shan on 08/16/2021 15:08:49 Stanis, [name] ([id number]) -------------------------------------------------------------------------------- SuperBill Details Patient Name: SHELLE, GALDAMEZ. Date of Service: 08/16/2021 Medical Record Number: [id number] Patient Account Number: [id number] Date of Birth/Sex: [birth year] (75 y.o. F) Treating RN: Donnamarie Poag Primary Care Provider: [name] Other Clinician: Referring Provider: [name] Treating Provider/Extender: Yaakov Guthrie in Treatment: 18 Diagnosis Coding ICD-10 Codes Code Description [phone] Chronic venous hypertension (idiopathic) with ulcer and inflammation of left lower extremity I89.0 Lymphedema, not elsewhere classified L97.221 Non-pressure chronic ulcer of left calf limited to breakdown of skin Facility Procedures CPT4 Code: [id number] Description: (Facility Use Only) [phone] - [facility] LWR LT LEG Modifier: Quantity: 1 Physician Procedures CPT4 Code Description: [id number] - WC PHYS LEVEL 3 - EST PT Modifier: Quantity: 1 CPT4 Code Description: ICD-10 Diagnosis Description I87.332 Chronic venous hypertension (idiopathic) with ulcer and inflammation of I89.0 Lymphedema, not elsewhere classified L97.221 Non-pressure chronic ulcer of left calf limited to breakdown of skin Modifier: left lower extremity Quantity: Electronic Signature(s) Signed: 08/16/2021 3:11:40 PM By: Kalman Shan DO Previous Signature: 08/16/2021 3:03:55 PM  Version By: Donnamarie Poag Entered By: Kalman Shan on 08/16/2021 15:11:18

## 2021-08-17 ENCOUNTER — Other Ambulatory Visit: Payer: Self-pay | Admitting: Family Medicine

## 2021-08-18 NOTE — Telephone Encounter (Signed)
ERx 

## 2021-08-18 NOTE — Telephone Encounter (Signed)
Refill request Albuterol, which was sent to the pharmacy 08/08/21 . Spoke to [facility] pharmacist and was advised that they did not get the script, verbal order given over the phone.  Robaxin last refill 07/05/21 #20 Last office visit 07/05/21

## 2021-08-22 ENCOUNTER — Encounter: Payer: Self-pay | Admitting: Family Medicine

## 2021-08-22 ENCOUNTER — Ambulatory Visit (INDEPENDENT_AMBULATORY_CARE_PROVIDER_SITE_OTHER): Payer: Medicare Other | Admitting: Family Medicine

## 2021-08-22 ENCOUNTER — Other Ambulatory Visit: Payer: Self-pay

## 2021-08-22 ENCOUNTER — Ambulatory Visit (INDEPENDENT_AMBULATORY_CARE_PROVIDER_SITE_OTHER)
Admission: RE | Admit: 2021-08-22 | Discharge: 2021-08-22 | Disposition: A | Discharge status: Home / Self Care | Payer: Medicare Other | Source: Ambulatory Visit | Attending: Family Medicine | Admitting: Family Medicine

## 2021-08-22 ENCOUNTER — Telehealth: Payer: Self-pay | Admitting: Family Medicine

## 2021-08-22 VITALS — BP 140/82 | HR 65 | Temp 98.0°F | Ht 63.0 in | Wt 204.4 lb

## 2021-08-22 DIAGNOSIS — M5441 Lumbago with sciatica, right side: Secondary | ICD-10-CM

## 2021-08-22 DIAGNOSIS — Z23 Encounter for immunization: Secondary | ICD-10-CM | POA: Diagnosis not present

## 2021-08-22 DIAGNOSIS — G959 Disease of spinal cord, unspecified: Secondary | ICD-10-CM

## 2021-08-22 DIAGNOSIS — S32030A Wedge compression fracture of third lumbar vertebra, initial encounter for closed fracture: Secondary | ICD-10-CM | POA: Diagnosis not present

## 2021-08-22 DIAGNOSIS — M542 Cervicalgia: Secondary | ICD-10-CM | POA: Diagnosis not present

## 2021-08-22 DIAGNOSIS — M5431 Sciatica, right side: Secondary | ICD-10-CM

## 2021-08-22 DIAGNOSIS — M5412 Radiculopathy, cervical region: Secondary | ICD-10-CM

## 2021-08-22 DIAGNOSIS — M5136 Other intervertebral disc degeneration, lumbar region: Secondary | ICD-10-CM | POA: Diagnosis not present

## 2021-08-22 DIAGNOSIS — M40202 Unspecified kyphosis, cervical region: Secondary | ICD-10-CM

## 2021-08-22 DIAGNOSIS — M48061 Spinal stenosis, lumbar region without neurogenic claudication: Secondary | ICD-10-CM | POA: Diagnosis not present

## 2021-08-22 DIAGNOSIS — G8929 Other chronic pain: Secondary | ICD-10-CM | POA: Diagnosis not present

## 2021-08-22 NOTE — Telephone Encounter (Signed)
Lvm returning pt's call.

## 2021-08-22 NOTE — Progress Notes (Signed)
Patient ID: [name], female    DOB: 1946/10/08, 75 y.o.   MRN: [id number]  This visit was conducted in person.  BP 140/82   Pulse 65   Temp 98 F (36.7 C) (Temporal)   Ht 5\' 3"  (1.6 m)   Wt 204 lb 7 oz (92.7 kg)   SpO2 94%   BMI 36.21 kg/m    CC: pain, f/u visit  Subjective:   HPI: [name] is a 75 y.o. female presenting on 08/22/2021 for Pain Management (Here for f/u.)   Ongoing chronic pain partly stemming from R sided sciatica as well as some R thigh weakness/unsteadiness when bearing weight - managing with oxycodone PRN #15 without significant benefit, we also tried robaxin muscle relaxant with initial benefit. Predominant pain currently to midline lower back. H/o lumbar laminectomy 2001. Also s/p ACDF 2016 and 2017 however no significant ongoing neck pain at this time. No shooting pain down arms, some finger paresthesias in h/o CTS. Not currently taking anything for pain.   MRI LUMBAR SPINE WITHOUT CONTRAST IMPRESSION: 1. Disc degeneration at L2-3 and below without progression from 2019. 2. L3-4 moderate spinal stenosis. 3. L4-5 and L5-S1 asymmetric right subarticular recess narrowing.  Last saw Dr Ellene Route ~18 months ago.   Saw oncology clinic earlier this month for h/o stage IIIc melanoma of R shoulder - planning brain MRI for further evaluation of imbalance (R veering when walking). MRI scheduled for Thursday. Also notes return of word finding difficulties - similar to when she was undergoing treatment for melanoma while on interferon (she did not tolerate interferon well).   Closely followed by wound clinic for chronic recurrent lower extremity wounds. Likely will be discharged tomorrow.   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      Relevant past medical, surgical, family and social history reviewed and updated as indicated. Interim medical history since our last visit reviewed. Allergies and medications reviewed and updated. Outpatient  Medications Prior to Visit  Medication Sig Dispense Refill   acetaminophen (TYLENOL) 650 MG CR tablet Take 2 tablets (1,300 mg total) by mouth 2 (two) times daily as needed for pain.     albuterol (VENTOLIN HFA) 108 (90 Base) MCG/ACT inhaler INHALE 2 PUFFS BY MOUTH EVERY 6 HOURS AS NEEDED FOR WHEEZING/SHORTNESS OF BREATH 8.5 each 0   aspirin EC 81 MG tablet Take 81 mg by mouth at bedtime. Melanoma prevention     furosemide (LASIX) 40 MG tablet TAKE 1 TABLET (40 MG TOTAL) BY MOUTH 2 (TWO) TIMES DAILY AS NEEDED FOR FLUID. TAKE SECOND DOSE IF NEEDED FOR LEG SWELLING 60 tablet 2   loratadine (CLARITIN) 10 MG tablet Take 10 mg by mouth daily.     losartan (COZAAR) 25 MG tablet TAKE 2 TABLETS BY MOUTH EVERY DAY 60 tablet 2   meloxicam (MOBIC) 15 MG tablet TAKE 1 TABLET BY MOUTH EVERY DAY AS NEEDED FOR PAIN 30 tablet 1   methocarbamol (ROBAXIN) 500 MG tablet TAKE 1 TABLET BY MOUTH 3 TIMES DAILY AS NEEDED FOR MUSCLE SPASMS (SEDATION PRECAUTIONS). 20 tablet 0   montelukast (SINGULAIR) 10 MG tablet TAKE 1 TABLET BY MOUTH EVERY DAY 30 tablet 3   Multiple Vitamin (MULTIVITAMIN WITH MINERALS) TABS tablet Take 1 tablet by mouth daily.     Polyvinyl Alcohol-Povidone (TEARS PLUS OP) Place 1 drop into both eyes daily as needed (dry eyes/ redness/ burning).      potassium chloride SA (KLOR-CON M20) 20 MEQ tablet  TAKE 1 TABLET BY MOUTH TWICE A DAY 60 tablet 3   PRESCRIPTION MEDICATION CPAP     Probiotic Product (PROBIOTIC DAILY PO) Take 1 tablet by mouth daily.      triamcinolone (NASACORT) 55 MCG/ACT AERO nasal inhaler Place 2 sprays into the nose daily. In each nostril     triamcinolone cream (KENALOG) 0.1 % Apply 1 application topically 2 (two) times daily. Apply to AA. Max 2 weeks at a time. 80 g 1   UNABLE TO FIND Take by mouth.     Vitamin D, Ergocalciferol, (DRISDOL) 1.25 MG (50000 UNIT) CAPS capsule TAKE 1 CAPSULE BY MOUTH EVERY 7 DAYS 12 capsule 3   oxyCODONE (ROXICODONE) 5 MG immediate release tablet Take  1 tablet (5 mg total) by mouth 3 (three) times daily as needed for severe pain. 15 tablet 0   No facility-administered medications prior to visit.     Per HPI unless specifically indicated in ROS section below Review of Systems  Objective:  BP 140/82   Pulse 65   Temp 98 F (36.7 C) (Temporal)   Ht 5\' 3"  (1.6 m)   Wt 204 lb 7 oz (92.7 kg)   SpO2 94%   BMI 36.21 kg/m   Wt Readings from Last 3 Encounters:  08/22/21 204 lb 7 oz (92.7 kg)  08/15/21 208 lb 1.9 oz (94.4 kg)  08/02/21 203 lb (92.1 kg)      Physical Exam Vitals and nursing note reviewed.  Constitutional:      Appearance: Normal appearance. She is not ill-appearing.  Neck:     Comments: Chronic cervical kyphosis Musculoskeletal:     Cervical back: Neck supple.  Neurological:     Mental Status: She is alert.     Deep Tendon Reflexes:     Reflex Scores:      Bicep reflexes are 2+ on the right side and 2+ on the left side.      Patellar reflexes are 1+ on the right side and 1+ on the left side.      Achilles reflexes are 1+ on the right side and 1+ on the left side.    Comments:  5/5 strength BUE, BLE Grip strength intact Sensation intact to light touch bilateral extremities   Psychiatric:     Comments: Circumferential       Results for orders placed or performed in visit on 08/15/21  CBC with Differential (Cancer Center Only)  Result Value Ref Range   WBC Count 7.1 4.0 - 10.5 K/uL   RBC 4.11 3.87 - 5.11 MIL/uL   Hemoglobin 12.5 12.0 - 15.0 g/dL   HCT 37.9 36.0 - 46.0 %   MCV 92.2 80.0 - 100.0 fL   MCH 30.4 26.0 - 34.0 pg   MCHC 33.0 30.0 - 36.0 g/dL   RDW 13.6 11.5 - 15.5 %   Platelet Count 179 150 - 400 K/uL   nRBC 0.0 0.0 - 0.2 %   Neutrophils Relative % 62 %   Neutro Abs 4.3 1.7 - 7.7 K/uL   Lymphocytes Relative 26 %   Lymphs Abs 1.9 0.7 - 4.0 K/uL   Monocytes Relative 8 %   Monocytes Absolute 0.6 0.1 - 1.0 K/uL   Eosinophils Relative 4 %   Eosinophils Absolute 0.3 0.0 - 0.5 K/uL    Basophils Relative 0 %   Basophils Absolute 0.0 0.0 - 0.1 K/uL   Immature Granulocytes 0 %   Abs Immature Granulocytes 0.02 0.00 - 0.07 K/uL  CMP (  [facility] only)  Result Value Ref Range   Sodium 142 135 - 145 mmol/L   Potassium 4.0 3.5 - 5.1 mmol/L   Chloride 105 98 - 111 mmol/L   CO2 31 22 - 32 mmol/L   Glucose, Bld 100 (H) 70 - 99 mg/dL   BUN 16 8 - 23 mg/dL   Creatinine 0.59 0.44 - 1.00 mg/dL   Calcium 10.0 8.9 - 10.3 mg/dL   Total Protein 6.5 6.5 - 8.1 g/dL   Albumin 3.9 3.5 - 5.0 g/dL   AST 12 (L) 15 - 41 U/L   ALT 16 0 - 44 U/L   Alkaline Phosphatase 48 38 - 126 U/L   Total Bilirubin 0.6 0.3 - 1.2 mg/dL   GFR, Estimated >60 >60 mL/min   Anion gap 6 5 - 15  Lactate dehydrogenase (LDH)  Result Value Ref Range   LDH 213 (H) 98 - 192 U/L   *Note: Due to a large number of results and/or encounters for the requested time period, some results have not been displayed. A complete set of results can be found in Results Review.    C-spine films IMPRESSION: ACDF C3-C7 with instrumentation at C4-C7. Ankylosis at C3-4 and C5-6. CT examination may be helpful to confirm ankylosis at C4-5 and C6-7. Moderate degenerative disc disease C7-T1.  L spine films IMPRESSION: Mild to moderate degenerative disc disease, most severe at L4-5. Remote appearing superior endplate fracture of L3 without significant loss of height. This does, however, appear new since prior MRI examination of 06/25/2020.  Assessment & Plan:  This visit occurred during the SARS-CoV-2 public health emergency.  Safety protocols were in place, including screening questions prior to the visit, additional usage of staff PPE, and extensive cleaning of exam room while observing appropriate contact time as indicated for disinfecting solutions.   Problem List Items Addressed This Visit     Kyphosis of cervical region    No significant upper extremity symptoms.  ?component of stenosis contributing to subjective RLE  weakness.  Update cervical films.       Relevant Orders   DG Cervical Spine Complete (Completed)   Chronic low back pain with right-sided sciatica - Primary    Ongoing lower back pain with R sciatica associated with subjective unsteadiness/weakness of R thigh although exam largely reassuring. Tried and failed multiple medications including opiate and muscle relaxant. H/o remote lumbar laminectomy. Last MRI 2021 showing DDD, moderate spinal stenosis at L3/4 as well as asymmetric R sided subarticular recess narrowing.  Will check lumbar films and consider lumbar MRI given endorsed subjective R leg weakness.       Relevant Orders   DG Lumbar Spine Complete (Completed)   MR Lumbar Spine Wo Contrast   Closed compression fracture of L3 lumbar vertebra, initial encounter ([place])    Incidental finding on recent xrays - update MRI.       Relevant Orders   MR Lumbar Spine Wo Contrast   Other Visit Diagnoses     Need for influenza vaccination       Relevant Orders   Flu Vaccine QUAD High Dose(Fluad) (Completed)        No orders of the defined types were placed in this encounter.  Orders Placed This Encounter  Procedures   DG Lumbar Spine Complete    Standing Status:   Future    Number of Occurrences:   1    Standing Expiration Date:   08/22/2022    Order Specific Question:  Reason for Exam (SYMPTOM  OR DIAGNOSIS REQUIRED)    Answer:   R sciatica    Order Specific Question:   Preferred imaging location?    Answer:   [facility]   DG Cervical Spine Complete    Standing Status:   Future    Number of Occurrences:   1    Standing Expiration Date:   08/22/2022    Order Specific Question:   Reason for Exam (SYMPTOM  OR DIAGNOSIS REQUIRED)    Answer:   neck pain in h/o ACDF x2    Order Specific Question:   Preferred imaging location?    Answer:   [facility]   MR Lumbar Spine Wo Contrast    Standing Status:   Future    Standing Expiration Date:   08/25/2022     Order Specific Question:   What is the patient's sedation requirement?    Answer:   No Sedation    Order Specific Question:   Does the patient have a pacemaker or implanted devices?    Answer:   No    Order Specific Question:   Preferred imaging location?    Answer:   [facility] (table limit-550lbs)   Flu Vaccine QUAD High Dose(Fluad)     Patient Instructions  Flu shot today  Xrays of neck and lower back today.  We will likely order MRI of lower back for further evaluation. I recommend return to neurosurgery for follow up visit.   Follow up plan: Return if symptoms worsen or fail to improve.  [name], MD

## 2021-08-22 NOTE — Patient Instructions (Addendum)
Flu shot today  Xrays of neck and lower back today.  We will likely order MRI of lower back for further evaluation. I recommend return to neurosurgery for follow up visit.

## 2021-08-22 NOTE — Telephone Encounter (Signed)
Pt would like lisa to call her concerning her AWV/cpe that mrG. Ask me to do but pt insist on speaking to pt

## 2021-08-23 ENCOUNTER — Encounter (HOSPITAL_BASED_OUTPATIENT_CLINIC_OR_DEPARTMENT_OTHER): Payer: Medicare Other | Admitting: Internal Medicine

## 2021-08-23 DIAGNOSIS — I890 Lymphedema, not elsewhere classified: Secondary | ICD-10-CM | POA: Diagnosis not present

## 2021-08-23 DIAGNOSIS — L97221 Non-pressure chronic ulcer of left calf limited to breakdown of skin: Secondary | ICD-10-CM | POA: Diagnosis not present

## 2021-08-23 DIAGNOSIS — I87332 Chronic venous hypertension (idiopathic) with ulcer and inflammation of left lower extremity: Secondary | ICD-10-CM

## 2021-08-23 NOTE — Progress Notes (Signed)
LELIA, JONS ([id number]) Visit Report for 08/23/2021 Arrival Information Details Patient Name: [name], [name]. Date of Service: 08/23/2021 3:30 PM Medical Record Number: [id number] Patient Account Number: [id number] Date of Birth/Sex: 1945-12-25 (75 y.o. F) Treating RN: Dolan Amen Primary Care Nova Evett: [name] Other Clinician: Referring Chevonne Bostrom: [name] Treating Selah Zelman/Extender: Yaakov Guthrie in Treatment: 19 Visit Information History Since Last Visit Pain Present Now: No Patient Arrived: Ambulatory Arrival Time: 15:30 Accompanied By: self Transfer Assistance: None Patient Identification Verified: Yes Secondary Verification Process Completed: Yes Patient Requires Transmission-Based No Precautions: Patient Has Alerts: Yes Patient Alerts: Patient on Blood Thinner ASPIRIN NOT diabetic Electronic Signature(s) Signed: 08/23/2021 4:16:56 PM By: Dolan Amen RN Entered By: Dolan Amen on 08/23/2021 15:30:46 [name], [name] ([id number]) -------------------------------------------------------------------------------- Clinic Level of Care Assessment Details Patient Name: [name], [name]. Date of Service: 08/23/2021 3:30 PM Medical Record Number: [id number] Patient Account Number: [id number] Date of Birth/Sex: October 05, 1946 (75 y.o. F) Treating RN: Dolan Amen Primary Care Jatasia Gundrum: [name] Other Clinician: Referring Jimmye Wisnieski: [name] Treating Aurore Redinger/Extender: Yaakov Guthrie in Treatment: 19 Clinic Level of Care Assessment Items TOOL 4 Quantity Score X - Use when only an EandM is performed on FOLLOW-UP visit 1 0 ASSESSMENTS - Nursing Assessment / Reassessment X - Reassessment of Co-morbidities (includes updates in patient status) 1 10 X- 1 5 Reassessment of Adherence to Treatment Plan ASSESSMENTS - Wound and Skin Assessment / Reassessment []  - Simple Wound Assessment / Reassessment - one wound 0 []  -  0 Complex Wound Assessment / Reassessment - multiple wounds []  - 0 Dermatologic / Skin Assessment (not related to wound area) ASSESSMENTS - Focused Assessment []  - Circumferential Edema Measurements - multi extremities 0 []  - 0 Nutritional Assessment / Counseling / Intervention []  - 0 Lower Extremity Assessment (monofilament, tuning fork, pulses) []  - 0 Peripheral Arterial Disease Assessment (using hand held doppler) ASSESSMENTS - Ostomy and/or Continence Assessment and Care []  - Incontinence Assessment and Management 0 []  - 0 Ostomy Care Assessment and Management (repouching, etc.) PROCESS - Coordination of Care X - Simple Patient / Family Education for ongoing care 1 15 []  - 0 Complex (extensive) Patient / Family Education for ongoing care []  - 0 Staff obtains Programmer, systems, Records, Test Results / Process Orders []  - 0 Staff telephones HHA, Nursing Homes / Clarify orders / etc []  - 0 Routine Transfer to another Facility (non-emergent condition) []  - 0 Routine Hospital Admission (non-emergent condition) []  - 0 New Admissions / Biomedical engineer / Ordering NPWT, Apligraf, etc. []  - 0 Emergency Hospital Admission (emergent condition) X- 1 10 Simple Discharge Coordination []  - 0 Complex (extensive) Discharge Coordination PROCESS - Special Needs []  - Pediatric / Minor Patient Management 0 []  - 0 Isolation Patient Management []  - 0 Hearing / Language / Visual special needs []  - 0 Assessment of Community assistance (transportation, D/C planning, etc.) []  - 0 Additional assistance / Altered mentation []  - 0 Support Surface(s) Assessment (bed, cushion, seat, etc.) INTERVENTIONS - Wound Cleansing / Measurement [name], TOOL. ([id number]) []  - 0 Simple Wound Cleansing - one wound []  - 0 Complex Wound Cleansing - multiple wounds []  - 0 Wound Imaging (photographs - any number of wounds) []  - 0 Wound Tracing (instead of photographs) []  - 0 Simple Wound Measurement -  one wound []  - 0 Complex Wound Measurement - multiple wounds INTERVENTIONS - Wound Dressings []  - Small Wound Dressing one or multiple wounds 0 []  - 0 Medium Wound Dressing one or multiple  wounds []  - 0 Large Wound Dressing one or multiple wounds []  - 0 Application of Medications - topical []  - 0 Application of Medications - injection INTERVENTIONS - Miscellaneous []  - External ear exam 0 []  - 0 Specimen Collection (cultures, biopsies, blood, body fluids, etc.) []  - 0 Specimen(s) / Culture(s) sent or taken to Lab for analysis []  - 0 Patient Transfer (multiple staff / Civil Service fast streamer / Similar devices) []  - 0 Simple Staple / Suture removal (25 or less) []  - 0 Complex Staple / Suture removal (26 or more) []  - 0 Hypo / Hyperglycemic Management (close monitor of Blood Glucose) []  - 0 Ankle / Brachial Index (ABI) - do not check if billed separately X- 1 5 Vital Signs Has the patient been seen at the hospital within the last three years: Yes Total Score: 45 Level Of Care: New/Established - Level 2 Electronic Signature(s) Signed: 08/23/2021 4:16:56 PM By: Dolan Amen RN Entered By: Dolan Amen on 08/23/2021 16:10:31 [name], [name] ([id number]) -------------------------------------------------------------------------------- Encounter Discharge Information Details Patient Name: [name], [name] Date of Service: 08/23/2021 3:30 PM Medical Record Number: [id number] Patient Account Number: [id number] Date of Birth/Sex: [birth year] (75 y.o. F) Treating RN: Dolan Amen Primary Care Krystalle Pilkington: [name] Other Clinician: Referring Shauntavia Brackin: [name] Treating Wisdom Rickey/Extender: Yaakov Guthrie in Treatment: 85 Encounter Discharge Information Items Discharge Condition: Stable Ambulatory Status: Ambulatory Discharge Destination: Home Transportation: Private Auto Accompanied By: self Schedule Follow-up Appointment: No Clinical Summary of Care: Electronic  Signature(s) Signed: 08/23/2021 4:16:56 PM By: Dolan Amen RN Entered By: Dolan Amen on 08/23/2021 16:12:09 [name] ([id number]) -------------------------------------------------------------------------------- Lower Extremity Assessment Details Patient Name: [name], HAVLIK. Date of Service: 08/23/2021 3:30 PM Medical Record Number: [id number] Patient Account Number: [id number] Date of Birth/Sex: 1946-07-16 (75 y.o. F) Treating RN: Dolan Amen Primary Care Ambrie Carte: [name] Other Clinician: Referring Jesusa Stenerson: [name] Treating Keithen Capo/Extender: Yaakov Guthrie in Treatment: 19 Edema Assessment Assessed: [name]: Yes] Patrice Paradise: No] Edema: [Left: Ye] [Right: s] Calf Left: Right: Point of Measurement: 32 cm From Medial Instep 42.5 cm Ankle Left: Right: Point of Measurement: 10 cm From Medial Instep 22.5 cm Electronic Signature(s) Signed: 08/23/2021 4:16:56 PM By: Dolan Amen RN Entered By: Dolan Amen on 08/23/2021 15:37:26 Younis, [name] ([id number]) -------------------------------------------------------------------------------- Multi Wound Chart Details Patient Name: [name], KROG. Date of Service: 08/23/2021 3:30 PM Medical Record Number: [id number] Patient Account Number: [id number] Date of Birth/Sex: 1945/11/17 (75 y.o. F) Treating RN: Dolan Amen Primary Care Aboubacar Matsuo: [name] Other Clinician: Referring Tyland Klemens: [name] Treating Noemy Hallmon/Extender: Yaakov Guthrie in Treatment: 19 Vital Signs Height(in): 63 Pulse(bpm): 85 Weight(lbs): 212 Blood Pressure(mmHg): 171/89 Body Mass Index(BMI): 38 Temperature(F): 98.8 Respiratory Rate(breaths/min): 18 Wound Assessments Treatment Notes Electronic Signature(s) Signed: 08/23/2021 4:19:50 PM By: Kalman Shan DO Entered By: Kalman Shan on 08/23/2021 16:15:21 Kops, [name]  ([id number]) -------------------------------------------------------------------------------- Multi-Disciplinary Care Plan Details Patient Name: [name], COLASURDO. Date of Service: 08/23/2021 3:30 PM Medical Record Number: [id number] Patient Account Number: [id number] Date of Birth/Sex: 09/03/1946 (75 y.o. F) Treating RN: Dolan Amen Primary Care Keilana Morlock: [name] Other Clinician: Referring Fernand Sorbello: [name] Treating Sandie Swayze/Extender: Yaakov Guthrie in Treatment: 72 Active Inactive Electronic Signature(s) Signed: 08/23/2021 4:16:56 PM By: Dolan Amen RN Entered By: Dolan Amen on 08/23/2021 15:37:34 Zook, [name] ([id number]) -------------------------------------------------------------------------------- Pain Assessment Details Patient Name: [name], THELANDER. Date of Service: 08/23/2021 3:30 PM Medical Record Number: [id number] Patient Account Number: [id number] Date of Birth/Sex: [birth year] (75 y.o. F) Treating RN: Dolan Amen Primary  Care Nahuel Wilbert: [name] Other Clinician: Referring Wally Shevchenko: [name] Treating Tarrell Debes/Extender: Yaakov Guthrie in Treatment: 19 Active Problems Location of Pain Severity and Description of Pain Patient Has Paino No Site Locations Rate the pain. Current Pain Level: 0 Pain Management and Medication Current Pain Management: Electronic Signature(s) Signed: 08/23/2021 4:16:56 PM By: Dolan Amen RN Entered By: Dolan Amen on 08/23/2021 15:32:33 Gural, [name] ([id number]) -------------------------------------------------------------------------------- Patient/Caregiver Education Details Patient Name: [name], BENSEN. Date of Service: 08/23/2021 3:30 PM Medical Record Number: [id number] Patient Account Number: [id number] Date of Birth/Gender: 09/18/1946 (75 y.o. F) Treating RN: Dolan Amen Primary Care Physician: [name] Other Clinician: Referring Physician:  [name] Treating Physician/Extender: Yaakov Guthrie in Treatment: 67 Education Assessment Education Provided To: Patient Education Topics Provided Notes discharge instructions Electronic Signature(s) Signed: 08/23/2021 4:16:56 PM By: Dolan Amen RN Entered By: Dolan Amen on 08/23/2021 16:11:41 Bazen, [name] ([id number]) -------------------------------------------------------------------------------- Vitals Details Patient Name: [name], COVELL. Date of Service: 08/23/2021 3:30 PM Medical Record Number: [id number] Patient Account Number: [id number] Date of Birth/Sex: 01-09-1946 (75 y.o. F) Treating RN: Dolan Amen Primary Care Petra Sargeant: [name] Other Clinician: Referring Rumi Kolodziej: [name] Treating Zoriana Oats/Extender: Yaakov Guthrie in Treatment: 19 Vital Signs Time Taken: 15:31 Temperature (F): 98.8 Height (in): 63 Pulse (bpm): 85 Weight (lbs): 212 Respiratory Rate (breaths/min): 18 Body Mass Index (BMI): 37.6 Blood Pressure (mmHg): 171/89 Reference Range: 80 - 120 mg / dl Electronic Signature(s) Signed: 08/23/2021 4:16:56 PM By: Dolan Amen RN Entered By: Dolan Amen on 08/23/2021 15:32:17

## 2021-08-23 NOTE — Telephone Encounter (Signed)
Lvm returning pt's call.

## 2021-08-23 NOTE — Progress Notes (Signed)
[name] ([id number]) Visit Report for 08/23/2021 Chief Complaint Document Details Patient Name: [name], [name]. Date of Service: 08/23/2021 3:30 PM Medical Record Number: [id number] Patient Account Number: [id number] Date of Birth/Sex: 07/04/1946 (75 y.o. F) Treating RN: Dolan Amen Primary Care Provider: [name] Other Clinician: Referring Provider: [name] Treating Provider/Extender: Yaakov Guthrie in Treatment: 23 Information Obtained from: Patient Chief Complaint Left calf venous stasis ulceration. 11/13/16; the patient is here for a left calf recurrent ulceration which is painful and has been present for the last month 01/22/17; the patient re-presents here for recurrent difficulties with blistering and leaking fluid on her left posterior calf 12/10/18; the patient returns [name] clinic for a wound on the left medial calf 04/12/2021; left lower extremity wound, 05/31/21; right leg wound Electronic Signature(s) Signed: 08/23/2021 4:19:50 PM By: Kalman Shan DO Entered By: Kalman Shan on 08/23/2021 16:15:33 Bow[place], [name] ([id number]) -------------------------------------------------------------------------------- HPI Details Patient Name: [name], [name]. Date of Service: 08/23/2021 3:30 PM Medical Record Number: [id number] Patient Account Number: [id number] Date of Birth/Sex: 08-27-1946 (75 y.o. F) Treating RN: Dolan Amen Primary Care Provider: [name] Other Clinician: Referring Provider: [name] Treating Provider/Extender: Yaakov Guthrie in Treatment: 34 History of Present Illness HPI Description: Pleasant 75 year old with history of chronic venous insufficiency. No diabetes or peripheral vascular disease. Left ABI 1.29. Questionable history of left lower extremity DVT. She developed a recurrent ulceration on her left lateral calf in December 2015, which she attributes [name] poor diet and subsequent lower  extremity edema. She underwent endovenous laser ablation of her left greater saphenous vein in 2010. She underwent laser ablation of accessory branch of left GSV in April 2016 by Dr. Kellie Simmering at [facility]. She was previously wearing Unna boots, which she tolerated well. Tolerating 2 layer compression and cadexomer iodine. She returns [name] clinic for follow-up and is without new complaints. She denies any significant pain at this time. She reports persistent pain with pressure. No claudication or ischemic rest pain. No fever or chills. No drainage. READMISSION 11/13/16; this is a 75 year old woman who is not a diabetic. She is here for a review of a painful area on her left medial lower extremity. I note that she was seen here previously last year for wound I believe [name] be in the same area. At that time she had undergone previously a left greater saphenous vein ablation by Dr. Kellie Simmering and she had a ablation of the anterior accessory branch of the left greater saphenous vein in March 2016. Seeing that the wound actually closed over. In reviewing the history with her today the ulcer in this area has been recurrent. She describes a biopsy of this area in 2009 that only showed stasis physiology. She also has a history of today malignant melanoma in the right shoulder for which she follows with Dr. 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 Do not see that she has ever had arterial studies in the left leg. She tells me she has a follow-up with Dr. Kellie Simmering in roughly 10 days In any case she developed the reopening of this area roughly a month ago. On the background of this she describes rapidly increasing edema which has responded [name] Lasix 40 mg and metolazone 2.5 mg as well as the patient's lymph massage. She has been told she has both venous insufficiency and lymphedema but she cannot tolerate  compression stockings 11/28/16; the patient saw Dr. Kellie Simmering recently. Per the patient he did arterial Dopplers in the office that did not show evidence of arterial insufficiency, per the patient he stated "treat this like an ordinary venous ulcer". She also saw her dermatologist Dr. Ronnald Ramp who felt that this was more of a vascular ulcer. In general things are improving although she arrives today with increasing bilateral lower extremity edema with weeping a deeper fluid through the wound on the left medial leg compatible with some degree of lymphedema 12/04/16; the patient's wound is fully epithelialized but I don't think fully healed. We will do another week of depression with Promogran and TCA however I suspect we'll be able [name] discharge her next week. This is a very unusual-looking wound which was initially a figure-of-eight type wound lying on its side surrounded by petechial like hemorrhage. She has had venous ablation on this side. She apparently does not have an arterial issue per Dr. Kellie Simmering. She saw her dermatologist thought it was "vascular". Patient is definitely going [name] need ongoing compression and I talked about this with her today she will go [name] elastic therapy after she leaves here next week 12/11/16; the patient's wound is not completely closed today. She has surrounding scar tissue and in further discussion with the patient it would appear that she had ulcers in this area in 2009 for a prolonged period of time ultimately requiring a punch biopsy of this area that only showed venous insufficiency. I did not previously pickup on this part of the history from the patient. 12/18/16; the patient's wound is completely epithelialized. There is no open area here. She has significant bilateral venous insufficiency with secondary lymphedema [name] a mild-[name]-moderate degree she does not have compression stockings.. She did not say anything [name] me when I was in the room, she told our intake nurse that she  was still having pain in this area. This isn't unusual recurrent small open area. She is going [name] go [name] elastic therapy [name] obtain compression stockings. 12/25/16; the patient's wound is fully epithelialized. There is no open area here. The patient describes some continued episodic discomfort in this area medial left calf. However everything looks fine and healed here. She is been [name] elastic therapy and caught herself 15-20 mmHg stockings, they apparently were having trouble getting 20-30 mm stockings in her size 01/22/17; this is a patient we discharged from the clinic a month ago. She has a recurrent open wound on her medial left calf. She had 15 mm support stockings. I told her I thought she needed 20-30 mm compression stockings. She tells me that she has been ill with hospitalization secondary [name] asthma and is been found [name] have severe hypokalemia likely secondary [name] a combination of Lasix and metolazone. This morning she noted blistering and leaking fluid on the posterior part of her left leg. She called our intake nurse urgently and we was saw her this afternoon. She has not had any real discomfort here. I don't know that she's been wearing any stockings on this leg for at least 2-3 days. ABIs in this clinic were 1.21 on the right and 1.3 on the left. She is previously seen vascular surgery who does not think that there is a peripheral arterial issue. 01/30/17; Patient arrives with no open wound on the left leg. She has been [name] elastic therapy and obtained 20-5mmhg below knee stockings and she has one on the right leg today. READMISSION 02/19/18; this Braggs is a now 75 year old  patient we've had in this clinic perhaps 3 times before. I had last looked at her from January 07 December 2016 with an area on the medial left leg. We discharged her on 12/25/16 however she had [name] be readmitted on 01/22/17 with a recurrence. I have in my notes that we discharged her on 20-30 mm stockings although she tells me  she was only wearing support hose because she cannot get stockings on predominantly related [name] her cervical spine surgery/issues. She has had previous ablations done by vein and vascular in [place] including a great saphenous vein ablation on the left with an anterior accessory branch ablation I think both of these were in 2016. On one of the previous visit she had a biopsy noted 2009 that was negative. She is not felt [name] have an arterial issue. She is not a diabetic. She does have a history of obstructive sleep apnea hypertension asthma as well as chronic venous insufficiency and lymphedema. On this occasion she noted 2 dry scaly patch on her left leg. She tried [name] put lotion on this it didn't really help. There were 2 open areas.the patient has been seeing her primary physician from 02/05/18 through 02/14/18. She had Unna boots applied. The superior wound now on the lateral left leg has closed but she's had one wound that remains open on the lateral left leg. This is not the same spot as we dealt with in 2018. ABIs in this clinic were 1.3 bilaterally [name], [name] ([id number]) 02/26/18; patient has a small wound on the left lateral calf. Dimensions are down. She has chronic venous insufficiency and lymphedema. 03/05/18; small open area on the left lateral calf. Dimensions are down. Tightly adherent necrotic debris over the surface of the wound which was difficult [name] remove. Also the dressing [over collagen] stuck [name] the wound surface. This was removed with some difficulty as well. Change the primary dressing [name] Hydrofera Blue ready 03/12/18; small open area on the left lateral calf. Comes in with tightly adherent surface eschar as well as some adherent Hydrofera Blue. 03/19/18; open area on the left lateral calf. Again adherent surface eschar as well as some adherent Hydrofera Blue nonviable subcutaneous tissue. She complained of pain all week even with the reduction from 4-3 layer compression I put on  last week. Also she had an increase in her ankle and calf measurements probably related [name] the same thing. 03/26/18; open area on the left lateral calf. A very small open area remains here. We used silver alginate starting last week as the Hydrofera Blue seem [name] stick [name] the wound bed. In using 4-layer compression 04/02/18; the open area in the left lateral calf at some adherent slough which I removed there is no open area here. We are able [name] transition her into her own compression stocking. Truthfully I think this is probably his support hose. However this does not maintain skin integrity will be limited. She cannot put over the toe compression stockings on because of neck problems hand problems etc. She is allergic [name] the lining layer of juxta lites. We might be forced [name] use extremitease stocking should this fail READMIT 11/24/2018 Patient is now a 75 year old woman who is not a diabetic. She has been in this clinic on at least 3 previous occasions largely with recurrent wounds on her left leg secondary [name] chronic venous insufficiency with secondary lymphedema. Her situation is complicated by inability [name] get stockings on and an allergy [name] neoprene which is apparently a component  and at least juxta lites and other stockings. As a result she really has not been wearing any stockings on her legs. She tells [place] that roughly 2 or 3 weeks ago she started noticing a stinging sensation just above her ankle on the left medial aspect. She has been diagnosed with pseudogout and she wondered whether this was what she was experiencing. She tried [name] dress this with something she bought at the store however subsequently it pulled skin off and now she has an open wound that is not improving. She has been using Vaseline gauze with a cover bandage. She saw her primary doctor last week who put an Haematologist on her. ABIs in this clinic was 1.03 on the left 2/12; the area is on the left medial ankle. Odd-looking wound  with what looks [name] be surface epithelialization but a multitude of small petechial openings. This clearly not closed yet. We have been using silver alginate under 3 layer compression with TCA 2/19; the wound area did not look quite as good this week. Necrotic debris over the majority of the wound surface which required debridement. She continues [name] have a multitude of what looked [name] be small petechial openings. She reminds [place] that she had a biopsy on this initially during her first outbreak in 2015 in [place] [facility]. She expresses concern about this being a possible melanoma. 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 I assured her that this does not look anything like melanoma. Except for the petechial reaction it does look like a venous insufficiency area and she certainly has evidence of this on both sides 2/26; a difficult area on the left medial ankle. The patient clearly has chronic venous hypertension with some degree of lymphedema. The odd thing about the area is the small petechial hemorrhages. I am not really sure how [name] explain this. This was present last time and this is not a compression injury. We have been using Hydrofera Blue which I changed [name] last week 3/4; still using Hydrofera Blue. Aggressive debridement today. She does not have known arterial issues. She has seen Dr. Kellie Simmering at [facility] vein and vascular and and has an ablation on the left. [Anterior accessory branch of the greater saphenous]. From what I remember they did not feel she had an arterial issue. The patient has had this area biopsied in 2009 at [name] dermatology and by her recollection they said this was "stasis". She is also follow-up with dermatology locally who thought that this was more of a vascular issue 3/11; using Hydrofera Blue. Aggressive debridement today. She does not have an arterial issue. We are using 3  layer compression although we may need [name] go [name] 4. The patient has been in for multiple changes [name] her wrap since I last saw her a week ago. She says that the area was leaking. I do not have too much more information on what was found 01/19/19 on evaluation today patient was actually being seen for a nurse visit when unfortunately she had the area on her left lateral lower extremity as well as weeping from the right lower extremity that became apparent. Therefore we did end up actually seeing her for a full visit with myself. She is having some pain at this site as well but fortunately nothing too significant at this point. No fevers, chills, nausea, or vomiting noted at this time. 3/18-Patient is back [name] the clinic with the  left leg venous leg ulcer, the ulcer is larger in size, has a surface that is densely adherent with fibrinous tissue, the Hydrofera Blue was used but is densely adherent and there was difficulty in removing it. The right lower extremity was also wrapped for weeping edema. Patient has a new area over the left lateral foot above the malleolus that is small and appears [name] have no debris with intact surrounding skin. Patient is on increased dose of Lasix also as a means [name] edema management 3/25; the patient has a nonhealing venous ulcer on the medial left leg and last week developed a smaller area on the lateral left calf. We have been using Hydrofera Blue with a contact layer. 4/1; no major change in these wounds areas. Left medial and more recently left lateral calf. I tried Iodoflex last week [name] aid in debridement she did not tolerate this. She stated her pain was terrible all week. She took the top layer of the 4 layer compression off. 4/8; the patient actually looks somewhat better in terms of her more prominent left lateral calf wound. There is some healthy looking tissue here. She is still complaining of a lot of discomfort. 4/15; patient in a lot of pain secondary [name]  sciatica. She is on a prednisone taper prescribed by her primary physician. She has the 2 areas one on the left medial and more recently a smaller area on the left lateral calf. Both of these just above the malleoli 4/22; her back pain is better but she still states she is very uncomfortable and now feels she is intolerant [name] the [employer]. No real change in the wounds we have been using Sorbact. She has been previously intolerant [name] Iodoflex. There is not a lot of option about what we can use [name] debride this wound under compression that she no doubt needs. sHe states Ultram no longer works for her pain 4/29; no major change in the wounds slightly increased depth. Surface on the original medial wound perhaps somewhat improved however the more recent area on the lateral left ankle is 100% covered in very adherent debris we have been using Sorbact. She tolerates 4 layer compression well and her edema control is a lot better. She has not had [name] come in for a nurse check 5/6; no major change in the condition of the wounds. She did consent [name] debridement today which was done with some difficulty. Continuing Sorbact. She did not tolerate Iodoflex. She was in for a check of her compression the day after we wrapped her last week this was adjusted but nothing much was found 5/13; no major change in the condition or area of the wounds. I was able [name] get a fairly aggressive debridement done on the lateral left leg wound. Even using Sorbact under compression. She came back in on Friday [name] have the wrap changed. She says she felt uncomfortable on the lateral aspect of her ankle. She has a long history of chronic venous insufficiency including previous ablation surgery on this side. 5/20-Patient returns for wounds on left leg with both wounds covered in slough, with the lateral leg wound larger in size, she has been in 3 layer compression and felt more comfortable, she describes pain in ankle, in leg and pins and  needles in foot, and is about [name] try Pamelor for this 6/3; wounds on the left lateral and left medial leg. The area medially which is the most recent of the 2 seems [name] have had the  largest increase in [place], [place]iefer ([id number]) dimensions. We have been using Sorbac [name] try and debride the surface. She has been [name] see orthopedics they apparently did a plain x-ray that was indeterminant. Diagnosed her with neuropathy and they have ordered an MRI [name] determine if there is underlying osteomyelitis. This was not high on my thought list but I suppose it is prudent. We have advised her [name] make an appointment with vein and vascular in [place]. She has a history of a left greater saphenous and accessory vein ablations I wonder if there is anything else that can be done from a surgical point of view [name] help in these difficult refractory wounds. We have previously healed this wound on one occasion but it keeps on reopening [medial side] 6/10; deep tissue culture I did last week I think on the left medial wound showed both moderate E. coli and moderate staph aureus [MSSA]. She is going [name] require antibiotics and I have chosen Augmentin. We have been using Sorbact and we have made better looking wound surface on both sides but certainly no improvement in wound area. She was back in last Friday apparently for a dressing changes the wrap was hurting her outer left ankle. She has not managed [name] get a hold of vein and vascular in [place]. We are going [name] have [name] make her that appointment 6/17; patient is tolerating the Augmentin. She had an MRI that I think was ordered by orthopedic surgeon this did not show osteomyelitis or an abscess did suggest cellulitis. We have been using Sorbact [name] the lateral and medial ankles. We have been trying [name] arrange a follow-up appointment with vein and vascular in [place] or did her original ablations. We apparently an area sent the request [name] vein and vascular in  [facility] 6/24; patient has completed the Augmentin. We do not yet have a vein and vascular appointment in [place]. I am not sure what the issue is here we have asked her [name] call tomorrow. We are using Sorbact. Making some improvements and especially the medial wound. Both surfaces however look better medial and lateral. 7/1; the patient has been in contact with vein and vascular in [place] but has not yet received an appointment. Using Sorbact we have gradually improve the wound surface with no improvement in surface area. She is approved for Apligraf but the wound surface still is not completely viable. She has not had [name] come in for a dressing change 7/8; the patient has an appointment with vein and vascular on 7/31 which is a Friday afternoon. She is concerned about getting back here for [place] [name] dress her wounds. I think it is important [name] have them goal for her venous reflux/history of ablations etc. [name] see if anything else can be done. She apparently tested positive for 1 of the blood tests with regards [name] lupus and saw a rheumatologist. He has raised the issue of vasculitis again. I have had this thought in the past however the evidence seems overwhelming that this is a venous reflux etiology. If the rheumatologist tells me there is clinical and laboratory investigation is positive for lupus I will rethink this. 7/15; the patient's wound surfaces are quite a bit better. The medial area which was her original wound now has no depth although the lateral wound which was the more recent area actually appears larger. Both with viable surfaces which is indeed better. Using Sorbact. I wanted [name] use Apligraf on her however there is the issue of the  vein and vascular appointment on 7/31 at 2:00 in the afternoon which would not allow her [name] get back [name] be rewrapped and they would no doubt remove the graft 7/22; the patient's wound surfaces have moderate amount of debris although generally look  better. The lateral one is larger with 2 small satellite areas superiorly. We are waiting for her vein and vascular appointment on 7/31. She has been approved for Apligraf which I would like [name] use after th 7/29; wound surfaces have improved no debridement is required we have been using Sorbact. She sees vein and vascular on Friday with this so question of whether anything can be done [name] lessen the likelihood of recurrence and/or speed the healing of these areas. She is already had previous ablations. She no doubt has severe venous hypertension 8/5-Patient returns at 1 week, she was in [place] for 3 days by her podiatrist, we have been using so backed [name] the wound, she has increased pain in both the wounds on the left lower leg especially the more distal one on the lateral aspect 8/12-Patient returns at 1 week and she is agreeable [name] having debridement in both wounds on her left leg today. We have been using Sorbact, and vascular studies were reviewed at last visit 8/19; the patient arrives with her wounds fairly clean and no debridement is required. We have used Sorbact which is really done a nice job in cleaning up these very difficult wound surfaces. The patient saw Dr. Donzetta Matters of vascular surgery on 7/31. He did not feel that there was an arterial component. He felt that her treated greater saphenous vein is adequately addressed and that the small saphenous vein did not appear [name] be involved significantly. She was also noted [name] have deep venous reflux which is not treatable. Dr. Donzetta Matters mentioned the possibility of a central obstructive component leading [name] reflux and he offered her central venography. She wanted [name] discuss this or think about it. I have urged her [name] go ahead with this. She has had recurrent difficult wounds in these areas which do heal but after months in the clinic. If there is anything that can be done [name] reduce the likelihood of this I think it is worth it. 9/2 she is still  working towards getting follow-up with Dr. Donzetta Matters [name] schedule her CT. Things are quite a bit worse venography. I put Apligraf on 2 weeks ago on both wounds on the medial and lateral part of her left lower leg. She arrives in clinic today with 3 superficial additional wounds above the area laterally and one below the wound medially. She describes a lot of discomfort. I think these are probably wrapped injuries. Does not look like she has cellulitis. 07/20/2019 on evaluation today patient appears [name] be doing somewhat poorly in regard [name] her lower extremity ulcers. She in fact showed signs of erythema in fact we may even be dealing with an infection at this time. Unfortunately I am unsure if this is just infection or if indeed there may be some allergic reaction that occurred as a result of the Apligraf application. With that being said that would be unusual but nonetheless not impossible in this patient is one who is unfortunately allergic [name] quite a bit. Currently we have been using the Sorbact which seems to do as well as anything for her. I do think we may want [name] obtain a culture today [name] see if there is anything showing up there that may need [name] be addressed.  9/16; noted that last week the wounds look worse in 1 week follow-up of the Apligraf. Using Sorbact as of 2 days ago. She arrives with copious amounts of drainage and new skin breakdown on the back of the left calf. The wounds arm more substantial bilaterally. There is a fair amount of swelling in the left calf no overt DVT there is edema present I think in the left greater than right thigh. She is supposed [name] go on 9/28 for CT venography. The wounds on the medial and lateral calf are worse and she has new skin breakdown posteriorly at least new for me. This is almost developing into a circumferential wound area The Apligraf was taken off last week which I agree with things are not going in the right direction a culture was done we do not have  that back yet. She is on Augmentin that she started 2 days ago 9/23; dressing was changed by her nurses on Monday. In general there is no improvement in the wound areas although the area looks less angry than last week. She did get Augmentin for MSSA cultured on the 14th. She still appears [name] have too much swelling in the left leg even with 3 layer compression 9/30; the patient underwent her procedure on 9/28 by Dr. Donzetta Matters at [facility]. She was discovered [name] have the common iliac vein measuring 12.2 mm but at the level of L4-L5 measured 3 mm. After stenting it measured 10 mm. 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 Rouleaux flow in the common femoral and femoral vein was observed much improved after stenting. We are using silver alginate [name] the wounds on the medial and lateral ankle on the left. 4 layer compression 10/7; the patient had fluid swelling around her knee and 4 layer compression. At the advice of vein and vascular this was reduced [name] 3 layer which she is tolerating better. We have been using silver alginate under 3 layer compression since last Friday 10/14; arrives with the areas on the left ankle looking a lot better. Inflammation in the area also a lot better. She came in for a nurse check on 10/9 10/21; continued nice improvement. Slight improvements in surface area of both the medial and lateral wounds on the left. A lot of the satellite lesions in the weeping erythema around these from stasis dermatitis is resolved. We have been using silver alginate TAMANIKA, HEINEY ([id number]) 10/28; general improvement in the entire wound areas although not a lot of change in dimensions the wound certainly looks better. There is a lot less in terms of venous inflammation. Continue silver alginate this week however look towards Hydrofera Blue next week 11/4; very adherent debris on the medial wound left wound is not as bad. We have been using silver alginate.  Change [name] [facility] today 11/11; very adherent debris on both wound areas. She went [name] vein and vascular last week and follow-up they put in [place] boot on this today. He says the [facility] was adherent. Wound is definitely not as good as last week. Especially on the left there the satellite lesions look more prominent 11/18; absolutely no better. erythema on lateral aspect with tenderness. 09/30/2019 on evaluation today patient appears [name] actually be doing better. Dr. Dellia Nims did put her on doxycycline last week which I do believe has helped her at this point. Fortunately there is no signs of active infection at this time. No fevers, chills, nausea, vomiting, or diarrhea. I  do believe he may want extend the doxycycline for 7 additional days just [name] ensure everything does completely cleared up the patient is in agreement with that plan. Otherwise she is going require some sharp debridement today 12/2; patient is completing a 2-week course of doxycycline. I gave her this empirically for inflammation as well as infection when I last saw her 2 weeks ago. All of this seems [name] be better. She is using silver alginate she has the area on the medial aspect of the larger area laterally and the 2 small satellite regions laterally above the major wound. 12/9; the patient's wound on the left medial and left lateral calf look really quite good. We have been using silver alginate. She saw vein and vascular in follow-up on 10/09/2019. She has had a previous left greater saphenous vein ablation by Dr. Oscar La in 2016. More recently she underwent a left common iliac vein stent by Dr. Donzetta Matters on 08/04/2019 due [name] May Thurner type lesions. The swelling is improved and certainly the wounds have improved. The patient shows [place] today area on the right medial calf there is almost no wound but leaking lymphedema. She says she start this started 3 or 4 days ago. She did not traumatize it. It is not painful. She does not  wear compression on that side 12/16; the patient continues to do well laterally. Medially still requiring debridement. The area on the right calf did not materialize [name] anything and is not currently open. We wrapped this last time. She has support stockings for that leg although I am not sure they are going [name] provide adequate compression 12/23; the lateral wound looks stable. Medially still requiring debridement for tightly adherent fibrinous debris. We've been using silver alginate. Surface area not any different 12/30; neither wound is any better with regards [name] surface and the area on the left lateral is larger. I been using silver alginate [name] the left lateral which look quite good last week and Sorbact [name] the left medial 11/11/2019. Lateral wound area actually looks better and somewhat smaller. Medial still requires a very aggressive debridement today. We have been using Sorbact on both wound areas 1/13; not much better still adherent debris bilaterally. I been using Sorbact. She has severe venous hypertension. Probably some degree of dermal fibrosis distally. I wonder whether tighter compression might help and I am going [name] try that today. We also need [name] work on the bioburden 1/20; using Sorbact. She has severe venous hypertension status post stent placement for pelvic vein compression. We applied gentamicin last time [name] see if we could reduce bioburden I had some discussion with her today about the use of pentoxifylline. This is occasionally used in this setting for wounds with refractory venous insufficiency. However this interacts with Plavix. She tells me that she was put on this after stent placement for 3 months. She will call Dr. Claretha Cooper office [name] discuss 1/27; we are using gentamicin under Sorbact. 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 She has a stent. Wound medially is measuring smaller this week. Laterally measuring slightly larger although she has some  satellite lesions superiorly 2/3; gentamicin under Sorbact under 4-layer compression. 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 She has a stent on Plavix. Her wounds are measuring smaller this week. More substantially laterally where there is a satellite lesion superiorly. 2/10; gentamicin under Sorbac. 4-layer compression. Patient communicated with Dr. Donzetta Matters at vein and vascular in [place]. He is okay with the patient coming  off Plavix I will therefore start her on pentoxifylline for a 1 month trial. In general her wounds look better today. I had some concerns about swelling in the left thigh however she measures 61.5 on the right and 63 on the mid thigh which does not suggest there is any difficulty. The patient is not describing any pain. 2/17; gentamicin under Sorbac 4-layer compression. She has been on pentoxifylline for 1 week and complains of loose stool. No nausea she is eating and drinking well 2/24; the patient apparently came in 2 days ago for a nurse visit when her wrap fell down. Both areas look a little worse this week macerated medially and satellite lesions laterally. Change [name] silver alginate today 3/3; wounds are larger today especially medially. She also has more swelling in her foot lower leg and I even noted some swelling in her posterior thigh which is tender. I wonder about the patency of her stent. Fortuitously she sees Dr. Claretha Cooper group on Friday 3/10; Mrs. Talamante was seen by vein and vascular on 3/5. The patient underwent ultrasound. There was no evidence of thrombosis involving the IVC no evidence of thrombosis involving the right common iliac vein there is no evidence of thrombosis involving the right external iliac vein the left external vein is also patent. The right common iliac vein stent appears patent bilateral common femoral veins are compressible and appear patent. I was concerned about the left common iliac stent however it looks like  this is functional. She has some edema in the posterior thigh that was tender she still has that this week. I also note they had trouble finding the pulses in her left foot and booked her for an ABI baseline in 4 weeks. She will follow up in 6 months for repeat IVC duplex. The patient stopped the pentoxifylline because of diarrhea. It does not look like that was being effective in any case. I have advised her [name] go back on her aspirin 81 mg tablet, vascular it also suggested this 3/17; comes in today with her wound surfaces a lot better. The excoriations from last week considerably better probably secondary [name] the TCA. We have been using silver alginate 3/24; comes in today with smaller wounds both medially and laterally. Both required debridement. There are 2 small satellite areas superiorly laterally. She also has a very odd bandlike area in the mid calf almost looking like there was a weakness in the wrap in a localized area. I would write this off as being this however anteriorly she has a small raised ballotable area that is very tender almost reminiscent of an abscess but there was no obvious purulent surface [name] it. 02/04/20 upon evaluation today patient appears [name] be doing fairly well in regard [name] her wounds today. Fortunately there is no signs of active infection at this time. No fevers, chills, nausea, vomiting, or diarrhea. She has been tolerating the dressing changes without complication. Fortunately I feel like she is showing signs of improvement although has been sometime since have seen her. Nonetheless the area of concern that Dr. Dellia Nims had last week where she had possibly an area of the wrap that was we can allow the leg [name] bulge appears [name] be doing significantly better today there is no signs of anything worsening. [name], [name] ([id number]) 4/7; the patient's wounds on her medial and lateral left leg continue [name] contract. We have been using a regular alginate. Last week she  developed an area on the right medial lower leg  which is probably a venous ulcer as well. 4/14; the wounds on her left medial and lateral lower leg continue [name] contract. Surface eschar. We have been using regular alginate. The area on the right medial lower leg is closed. We have been putting both legs under 4-layer contraction. The patient went back [name] see vein and vascular she had arterial studies done which were apparently "quite good" per the patient although I have not read their notes I have never felt she had an arterial issue. The patient has refractory lymphedema secondary [name] severe chronic venous insufficiency. This is been longstanding and refractory [name] exercise, leg elevation and longstanding use of compression wraps in our clinic as well as compression stockings on the times we have been able [name] get these [name] heal 4/21; we thought she actually might be close this week however she arrives in clinic with a lot of edema in her upper left calf and into her posterior thigh. This is been an intermittent problem here. She says the wrap fell down but it was replaced with a nurse visit on Monday. We are using calcium alginate [name] the wounds and the wound sizes there not terribly larger than last week but there is a lot more edema 4/28; again wound edges are smaller on both sides. Her edema is better controlled than last time. She is obtained her compression pumps from [employer] although they have not been [name] her home [name] set these up. 5/5; left medial and left lateral both look stable. I am not sure the medial is any smaller. We have been using calcium alginate under 4-layer compression. oShe had an area on the right medial. This was eschared today. We have been wrapping this as well. She does not tolerate external compression stockings due [name] a history of various contact allergies. She has her compression pumps however the representative from the company is coming on her [name] show her how  [name] use these tomorrow 5/19; patient with severe chronic venous insufficiency secondary [name] central venous disease. She had a stent placed in her left common iliac vein. She has done better since but still difficult [name] control wounds. She comes in today with nothing open on the right leg. Her areas on the left medial and left lateral are just about closed. We are using calcium alginate under 4-layer compression. She is using her external compression pumps at home She only has 15-20 support stockings. States she cannot get anything tighter than that on. 03/30/20-Patient returns at 1 week, the wounds on the left leg are both slightly bigger, the last week she was on 3 layer compression which started [name] slide down. She is starting [name] use her lymphedema pumps although she stated on 1 day her right ankle started [name] swell up and she have [name] stop that day. Unfortunately the open area seem [name] oscillate between improving [name] the point of healing and then flaring up all to do with effectiveness of compression or lack of due [name] the left leg topography not keeping the compression wraps from rolling down 6/2 patient comes in with a 15/20 mmHg stocking on the right leg. She tells me that she developed a lot of swelling in her ankles she saw orthopedics she was felt [name] possibly be having a flare of pseudogout versus some other type of arthritis. She was put on steroids for a respiratory issue so that helps with the inflammation. She has not been using the pumps all week. She thinks the left thigh  is more swollen than usual and I would agree with that. She has an appointment with Dr. Donzetta Matters 9 days or so from now 6/9; both wounds on the left medial and left lateral are smaller. We have been using calcium alginate under compression. She does not have an open wound on the right leg she is using a stocking and her compression pumps things are going well. She has an appointment with Dr. Donzetta Matters with regards [name] her stent in the  left common iliac vein 6/16; the wounds on the left medial and left lateral ankle continues [name] contract. The patient saw Dr. Donzetta Matters and I think he seems satisfied. Ordered follow-up venous reflux studies on both sides in September. Cautioned that she may need thigh-high stockings. She has been using calcium alginate under compression on the left and her own stocking on the right leg. She tells [place] there are no open wounds on the right 6/23; left lateral is just about closed. Medial required debridement today. We have been using calcium alginate. Extensive discussion about the compression pumps she is only using these on 25 mmHg states she could not take 40 or 30 when the wrap came out [name] her home [name] demonstrate these. He said they should not feel tight 6/30; the left lateral wound has a slight amount of eschar. . The area medially is about the same using Hydrofera Blue. 7/7; left lateral wound still has some eschar. I will remove this next week may be closed. The area medially is very small using Hydrofera Blue with improvement. Unfortunately the stockings fell down. Unfortunately the blisters have developed at the edge of where the wrap fell. When this happened she says her legs hurt she did not use her pumps. We are not open Monday for her [name] come in and change the wraps and she had an appointment yesterday. She also tells me that she is going [name] have an MRI of her back. She is having pain radiating into her left anterior leg she thinks her from an L5 disc. She saw Dr. Ellene Route of neurosurgery 7/14; the area on the left lateral ankle area is closed. Still a small area medially however it looks better as well. We have been using Hydrofera Blue under 4-layer compression 7/21; left lateral ankle is still closed however her wound on the medial left calf is actually larger. This is probably because Hydrofera Blue got stuck [name] the wound. She came in for a nurse change on Friday and will do that again this  week I was concerned about the amount of swelling that she had last week however she is using her compression pumps twice a day and the swelling seems well controlled 7/28; remaining wound on the left medial lower leg is smaller. We have been using moistened silver collagen under compression she is coming back for a nurse visit. For reasons that were not really clear she was just keeping her legs elevated and not using her compression pumps. I have asked her [name] use the compression pumps. She does not have any wounds on the right leg 06/15/20-Patient returns at 2 weeks, her LLE edema is worse and she developed a blister wound that is new and has bigger posterior calf wound on right, we are using Prisma with pad, 4 layer compression. she has been on lasix 40 mg daily 8/18; patient arrives today with things a lot worse than I remember from a few weeks ago. She was seen last week. Noted that her edema was  worse and that she now had a left lateral wound as well as deteriorating edema in the medial and posterior part of the lower leg. She says she is using his or her external compression pumps once a day although I wonder about the compliance. 8/25; weeping area on the right medial lower leg. This had actually gotten a small localized area of her compression stocking wet. oOn the left side there is a large denuded area on the posterior medial lower leg and smaller area on the lateral. This was not the original areas that we dealt with. 9/1 the patient's wound on the left leg include the left lateral and left posterior. Larger superficial wounds weeping. She has very poor edema control. Tender localized edema in the left lower medial ankle/heel probably because of localized wrap issues. She freely admits she is not using the compression pumps. She has been up on her feet a lot. She thinks the hydrofera blue is contributing [name] the pain she is experiencing.. This is a complaint that I have occasionally  heard 9/8; really not much improvement. The patient is still complaining of a lot of pain particularly when she uses compression pumps. I switched her [name] silver alginate last time because she found the Hydrofera Blue [name] be irritating. I don't hear much difference in her description with the silver alginate. 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 She has follow-up with Dr. Donzetta Matters I think for ultrasound next month [name], TOOTHAKER ([id number]) 9/15; quite a bit of improvement today. We have less edema and more epithelialization in both of her wound areas on the left medial and left lateral calf. These are not the site of her original wounds in this area. She says she has been using her compression pumps for 30 minutes twice a day, there pain issues that never quite understood. Silver alginate as the primary dressing 9/22; continued improvement. Both areas medially and laterally still have a small open area there is some eschar. She continues [name] complain of left medial ankle pain. Swelling in the leg is in much better condition. We have been using silver alginate 9/29; continued improvement. Both areas medially and laterally in the left calf look as though they are close some minor surface eschar but I think this is epithelialized. She comes in today saying she has a ruptured disc at L4-L5 cannot bend over [name] put on her stockings. 10/6; patient comes in today with no open wounds on either leg. However her edema on the left leg in the upper one third of the lower leg is poorly controlled nonpitting. She says that she could not use the pumps for 2 days and then she has been using the last couple of days. It is not clear [name] me she has been able [name] get her stocking on. She has back problems. Mrs. Vanderweele has severe chronic venous insufficiency with secondary lymphedema. Her venous insufficiency is partially centrally mediated and  that she is now post stent in the left common iliac vein. [facility] Thurner's syndrome/physiology]. She follows up with them on 10/15. She wears 20/30 below-knee stockings. She is supposed [name] use compression pumps at home although I think her compliance about with this is been less than 100%. I have asked her [name] use these 3 times a day. Finally I think she has lipodermatosclerosis in the left lower leg with an inverted bottle sign. It is been  a major problem controlling the edema in the left leg. The right leg we have had wounds on but not as significant a problem is on the left READMISSION 04/12/2021 Mrs. [name] is a 75 year old woman we know well in this clinic. She has severe chronic venous insufficiency. 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 I believe she has had bilateral greater saphenous vein ablation in the past as well. She tells me that this wound opened sometime in March. She had a fall and thinks it was initially abrasion. She developed areas she describes as little blisters on the anterior part of her leg and she saw dermatology and was treated for methicillin staph aureus with several rounds of antibiotics. She has been using support stockings on the left leg and says this is the only thing she can get on. Her compression pump use maybe once a day she says if she did not use one she use the other. She comes in today with incredible swelling in the left leg with a wound on the left posterior calf. She has been using Neosporin [name] this previously a hydrocolloid. 6/15; patient arrives back for 1 week follow-up.Marland Kitchen Apparently her wrap fell down she did not call us [name] replace this. He has poor edema control. She only uses her compression pumps once a day 6/29; patient presents for 1 week follow-up. She has tolerated the compression wrap well. We have been using Iodoflex under the wrap. She has no issues or complaints today. 7/6; patient presents for 1  week follow-up. She states that the compression wrap rolled down her leg 4 days ago. She has been trying [name] keep the area covered but has no dressings at home [name] use. She denies signs of infection. 7/13; patient presents for 1 week follow-up. She states that her compression wrap rolled down her right leg and she called our office and had it placed a few days ago. She has been tolerating the current wrap well. She states that the Iodoflex is causing a burning sensation. She denies signs of infection. 7/27; patient presents for 1 week follow-up She has tolerated the compression wrap well with Hydrofera Blue underneath. She has now developed a wound [name] her right lower extremity. She reports having a culture done [name] this area by her dermatologist that she reports is negative. She currently denies signs of infection. 8/30; patient presents for 1 week follow-up. On the left side she has tolerated the compression wrap well with Hydrofera Blue underneath. She reports some discomfort [name] her right lower extremity with the 3 layer compression. She currently denies signs of infection. 06/14/2021 upon evaluation today patient's wound actually appears [name] be doing decently well based on what I am seeing currently. She actually has 2 areas 1 on the left distal/posterior lower leg and the other on the right lower leg. Subsequently the measurements are roughly the same may be slightly smaller but in general have not made any trend towards getting worse which is great news. 06/23/2021 upon evaluation today patient appears [name] be doing pretty well in regard [name] her wounds. On the right she is having difficulty with sciatic pain and this subsequently has led [name] her taking the wrap off over the last week her leg is more swollen she is also been on prednisone for 10 days. With that being said I think we may want [name] just use a compression sock on the left that way she will not have  [name] worry about the compression wrap being in  place that she cannot get off. With that being said I do believe as well that the patient is going [name] need [name] continue with the wrapping on the left we will also need to do a little bit of sharp debridement today. 8/24; patient presents for 1 week follow-up. She has no issues or complaints today. She denies signs of infection. 8/31; patient presents for 1 week follow-up. She has no issues or complaints today. She has tolerated the compression wrap well on the left lower extremity. She denies signs of infection. 9/7; patient presents for follow-up. She reports that the compression wrap rolled down slightly on her right lower extremity. She reports tenderness [name] the wound bed. She denies increased warmth or erythema [name] the surrounding skin. She overall feels well. 9/14; patient presents for follow-up. She has no issues or complaints today. She denies signs of infection. PuraPly is available and patient would like [name] start this today. 9/21; patient presents for follow-up. She has no issues or complaints today. She tolerated the first skin substitute placement last week under compression. 9/28; patient presents for 1 week follow-up. She has no issues or complaints today. 10/5; patient presents for 1 week follow-up. She reports taking the wrap off yesterday. She was on her feet more yesterday and developed more swelling [name] her left lower extremity. She denies pain. Overall she is doing well. 10/12; patient presents for 1 week follow-up. She has no issues or complaints today. 10/19; patient presents for follow-up. She has no issues or complaints today. She tolerated the compression wrap well. She states she has compression stockings at home. [name], [name] ([id number]) Electronic Signature(s) Signed: 08/23/2021 4:19:50 PM By: Kalman Shan DO Entered By: Kalman Shan on 08/23/2021 16:16:03 [name], [name]  ([id number]) -------------------------------------------------------------------------------- Physical Exam Details Patient Name: [name], [name]. Date of Service: 08/23/2021 3:30 PM Medical Record Number: [id number] Patient Account Number: [id number] Date of Birth/Sex: 1946-08-20 (75 y.o. F) Treating RN: Dolan Amen Primary Care Provider: [name] Other Clinician: Referring Provider: [name] Treating Provider/Extender: Yaakov Guthrie in Treatment: 66 Constitutional . Cardiovascular . Psychiatric . Notes Left lower extremity: [name] the distal medial aspect there is epithelialization [name] the previous wound site. Dry flaky skin noted. No signs of infection. Electronic Signature(s) Signed: 08/23/2021 4:19:50 PM By: Kalman Shan DO Entered By: Kalman Shan on 08/23/2021 16:16:45 Bong, [name] ([id number]) -------------------------------------------------------------------------------- Physician Orders Details Patient Name: HANIYYAH, SAKUMA. Date of Service: 08/23/2021 3:30 PM Medical Record Number: [id number] Patient Account Number: [id number] Date of Birth/Sex: Apr 19, 1946 (75 y.o. F) Treating RN: Dolan Amen Primary Care Provider: [name] Other Clinician: Referring Provider: [name] Treating Provider/Extender: Yaakov Guthrie in Treatment: 27 Verbal / Phone Orders: No Diagnosis Coding Discharge From [facility] o Discharge from [facility] o Wear compression garments daily. Put garments on first thing when you wake up and remove them before bed. o Moisturize legs daily after removing compression garments. Electronic Signature(s) Signed: 08/23/2021 4:16:56 PM By: Dolan Amen RN Signed: 08/23/2021 4:19:50 PM By: Kalman Shan DO Entered By: Dolan Amen on 08/23/2021 16:07:29 [name], [name]  ([id number]) -------------------------------------------------------------------------------- Problem List Details Patient Name: ITZAYANA, PARDY. Date of Service: 08/23/2021 3:30 PM Medical Record Number: [id number] Patient Account Number: [id number] Date of Birth/Sex: 04/09/1946 (75 y.o. F) Treating RN: Dolan Amen Primary Care Provider: [name] Other Clinician: Referring Provider: [name] Treating Provider/Extender: Yaakov Guthrie in Treatment: 4 Active Problems  ICD-10 Encounter Code Description Active Date MDM Diagnosis I87.332 Chronic venous hypertension (idiopathic) with ulcer and inflammation of 04/12/2021 No Yes left lower extremity I89.0 Lymphedema, not elsewhere classified 04/12/2021 No Yes L97.221 Non-pressure chronic ulcer of left calf limited [name] breakdown of skin 04/12/2021 No Yes Inactive Problems Resolved Problems ICD-10 Code Description Active Date Resolved Date S81.801A Unspecified open wound, right lower leg, initial encounter 05/31/2021 05/31/2021 Electronic Signature(s) Signed: 08/23/2021 4:19:50 PM By: Kalman Shan DO Entered By: Kalman Shan on 08/23/2021 16:15:01 Schaffert, [name] ([id number]) -------------------------------------------------------------------------------- Progress Note/History and Physical Details Patient Name: RILLA, BUCKMAN. Date of Service: 08/23/2021 3:30 PM Medical Record Number: [id number] Patient Account Number: [id number] Date of Birth/Sex: 1945/12/28 (75 y.o. F) Treating RN: Dolan Amen Primary Care Provider: [name] Other Clinician: Referring Provider: [name] Treating Provider/Extender: Yaakov Guthrie in Treatment: 71 Subjective Chief Complaint Information obtained from Patient Left calf venous stasis ulceration. 11/13/16; the patient is here for a left calf recurrent ulceration which is painful and has been present for the last month 01/22/17; the patient re-presents  here for recurrent difficulties with blistering and leaking fluid on her left posterior calf 12/10/18; the patient returns [name] clinic for a wound on the left medial calf 04/12/2021; left lower extremity wound, 05/31/21; right leg wound History of Present Illness (HPI) Pleasant 75 year old with history of chronic venous insufficiency. No diabetes or peripheral vascular disease. Left ABI 1.29. Questionable history of left lower extremity DVT. She developed a recurrent ulceration on her left lateral calf in December 2015, which she attributes [name] poor diet and subsequent lower extremity edema. She underwent endovenous laser ablation of her left greater saphenous vein in 2010. She underwent laser ablation of accessory branch of left GSV in April 2016 by Dr. Kellie Simmering at [facility]. She was previously wearing Unna boots, which she tolerated well. Tolerating 2 layer compression and cadexomer iodine. She returns [name] clinic for follow-up and is without new complaints. She denies any significant pain at this time. She reports persistent pain with pressure. No claudication or ischemic rest pain. No fever or chills. No drainage. READMISSION 11/13/16; this is a 75 year old woman who is not a diabetic. She is here for a review of a painful area on her left medial lower extremity. I note that she was seen here previously last year for wound I believe [name] be in the same area. At that time she had undergone previously a left greater saphenous vein ablation by Dr. Kellie Simmering and she had a ablation of the anterior accessory branch of the left greater saphenous vein in March 2016. Seeing that the wound actually closed over. In reviewing the history with her today the ulcer in this area has been recurrent. She describes a biopsy of this area in 2009 that only showed stasis physiology. 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 Do not see that she has ever had arterial studies in the left leg. She tells me she has a follow-up with Dr. Kellie Simmering in roughly 10 days In any case she developed the reopening of this area roughly a month ago. On the background of this she describes rapidly increasing edema which has responded [name] Lasix 40 mg and metolazone 2.5 mg as well as the patient's lymph massage. She has been  told she has both venous insufficiency and lymphedema but she cannot tolerate compression stockings 11/28/16; the patient saw Dr. Kellie Simmering recently. Per the patient he did arterial Dopplers in the office that did not show evidence of arterial insufficiency, per the patient he stated "treat this like an ordinary venous ulcer". She also saw her dermatologist Dr. Ronnald Ramp who felt that this was more of a vascular ulcer. In general things are improving although she arrives today with increasing bilateral lower extremity edema with weeping a deeper fluid through the wound on the left medial leg compatible with some degree of lymphedema 12/04/16; the patient's wound is fully epithelialized but I don't think fully healed. We will do another week of depression with Promogran and TCA however I suspect we'll be able [name] discharge her next week. This is a very unusual-looking wound which was initially a figure-of-eight type wound lying on its side surrounded by petechial like hemorrhage. She has had venous ablation on this side. She apparently does not have an arterial issue per Dr. Kellie Simmering. She saw her dermatologist thought it was "vascular". Patient is definitely going [name] need ongoing compression and I talked about this with her today she will go [name] elastic therapy after she leaves here next week 12/11/16; the patient's wound is not completely closed today. She has surrounding scar tissue and in further discussion with the patient it  would appear that she had ulcers in this area in 2009 for a prolonged period of time ultimately requiring a punch biopsy of this area that only showed venous insufficiency. I did not previously pickup on this part of the history from the patient. 12/18/16; the patient's wound is completely epithelialized. There is no open area here. She has significant bilateral venous insufficiency with secondary lymphedema [name] a mild-[name]-moderate degree she does not have compression stockings.. She did not say anything [name] me when I was in the room, she told our intake nurse that she was still having pain in this area. This isn't unusual recurrent small open area. She is going [name] go [name] elastic therapy [name] obtain compression stockings. 12/25/16; the patient's wound is fully epithelialized. There is no open area here. The patient describes some continued episodic discomfort in this area medial left calf. However everything looks fine and healed here. She is been [name] elastic therapy and caught herself 15-20 mmHg stockings, they apparently were having trouble getting 20-30 mm stockings in her size 01/22/17; this is a patient we discharged from the clinic a month ago. She has a recurrent open wound on her medial left calf. She had 15 mm support stockings. I told her I thought she needed 20-30 mm compression stockings. She tells me that she has been ill with hospitalization secondary [name] asthma and is been found [name] have severe hypokalemia likely secondary [name] a combination of Lasix and metolazone. This morning she noted blistering and leaking fluid on the posterior part of her left leg. She called our intake nurse urgently and we was saw her this afternoon. She has not had any real discomfort here. I don't know that she's been wearing any stockings on this leg for at least 2-3 days. ABIs in this clinic were 1.21 on the right and 1.3 on the left. She is previously seen vascular surgery who does not think that there is a peripheral  arterial issue. 01/30/17; Patient arrives with no open wound on the left leg. She has been [name] elastic therapy and obtained 20-52mmhg below knee stockings and she has one  on the right leg today. READMISSION 02/19/18; this Ng is a now 75 year old patient we've had in this clinic perhaps 3 times before. I had last looked at her from January 07 December 2016 with an area on the medial left leg. We discharged her on 12/25/16 however she had [name] be readmitted on 01/22/17 with a recurrence. I have in my notes that we discharged her on 20-30 mm stockings although she tells me she was only wearing support hose because she cannot get stockings on predominantly related [name] her cervical spine surgery/issues. She has had previous ablations done by vein and vascular in [place] including a great saphenous vein ablation on the left with an anterior accessory branch ablation I think both of these were in 2016. On one of the previous visit she had a biopsy noted 2009 that was negative. She is not felt [name] have an arterial issue. She is not a diabetic. She does have a MELAYNA, ROBARTS. ([id number]) history of obstructive sleep apnea hypertension asthma as well as chronic venous insufficiency and lymphedema. On this occasion she noted 2 dry scaly patch on her left leg. She tried [name] put lotion on this it didn't really help. There were 2 open areas.the patient has been seeing her primary physician from 02/05/18 through 02/14/18. She had Unna boots applied. The superior wound now on the lateral left leg has closed but she's had one wound that remains open on the lateral left leg. This is not the same spot as we dealt with in 2018. ABIs in this clinic were 1.3 bilaterally 02/26/18; patient has a small wound on the left lateral calf. Dimensions are down. She has chronic venous insufficiency and lymphedema. 03/05/18; small open area on the left lateral calf. Dimensions are down. Tightly adherent necrotic debris over the surface of  the wound which was difficult [name] remove. Also the dressing [over collagen] stuck [name] the wound surface. This was removed with some difficulty as well. Change the primary dressing [name] Hydrofera Blue ready 03/12/18; small open area on the left lateral calf. Comes in with tightly adherent surface eschar as well as some adherent Hydrofera Blue. 03/19/18; open area on the left lateral calf. Again adherent surface eschar as well as some adherent Hydrofera Blue nonviable subcutaneous tissue. She complained of pain all week even with the reduction from 4-3 layer compression I put on last week. Also she had an increase in her ankle and calf measurements probably related [name] the same thing. 03/26/18; open area on the left lateral calf. A very small open area remains here. We used silver alginate starting last week as the Hydrofera Blue seem [name] stick [name] the wound bed. In using 4-layer compression 04/02/18; the open area in the left lateral calf at some adherent slough which I removed there is no open area here. We are able [name] transition her into her own compression stocking. Truthfully I think this is probably his support hose. However this does not maintain skin integrity will be limited. She cannot put over the toe compression stockings on because of neck problems hand problems etc. She is allergic [name] the lining layer of juxta lites. We might be forced [name] use extremitease stocking should this fail READMIT 11/24/2018 Patient is now a 75 year old woman who is not a diabetic. She has been in this clinic on at least 3 previous occasions largely with recurrent wounds on her left leg secondary [name] chronic venous insufficiency with secondary lymphedema. Her situation is complicated by inability [name] get  stockings on and an allergy [name] neoprene which is apparently a component and at least juxta lites and other stockings. As a result she really has not been wearing any stockings on her legs. She tells [place] that roughly 2 or 3  weeks ago she started noticing a stinging sensation just above her ankle on the left medial aspect. She has been diagnosed with pseudogout and she wondered whether this was what she was experiencing. She tried [name] dress this with something she bought at the store however subsequently it pulled skin off and now she has an open wound that is not improving. She has been using Vaseline gauze with a cover bandage. She saw her primary doctor last week who put an Haematologist on her. ABIs in this clinic was 1.03 on the left 2/12; the area is on the left medial ankle. Odd-looking wound with what looks [name] be surface epithelialization but a multitude of small petechial openings. This clearly not closed yet. We have been using silver alginate under 3 layer compression with TCA 2/19; the wound area did not look quite as good this week. Necrotic debris over the majority of the wound surface which required debridement. She continues [name] have a multitude of what looked [name] be small petechial openings. She reminds [place] that she had a biopsy on this initially during her first outbreak in 2015 in [place] [facility]. She expresses concern about this being a possible melanoma. 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 I assured her that this does not look anything like melanoma. Except for the petechial reaction it does look like a venous insufficiency area and she certainly has evidence of this on both sides 2/26; a difficult area on the left medial ankle. The patient clearly has chronic venous hypertension with some degree of lymphedema. The odd thing about the area is the small petechial hemorrhages. I am not really sure how [name] explain this. This was present last time and this is not a compression injury. We have been using Hydrofera Blue which I changed [name] last week 3/4; still using Hydrofera Blue. Aggressive debridement today. She  does not have known arterial issues. She has seen Dr. Kellie Simmering at [facility] vein [facility] vascular and and has an ablation on the left. [Anterior accessory branch of the greater saphenous]. From what I remember they did not feel she had an arterial issue. The patient has had this area biopsied in 2009 at [facility] dermatology and by her recollection they said this was "stasis". She is also follow-up with dermatology locally who thought that this was more of a vascular issue 3/11; using Hydrofera Blue. Aggressive debridement today. She does not have an arterial issue. We are using 3 layer compression although we may need [name] go [name] 4. The patient has been in for multiple changes [name] her wrap since I last saw her a week ago. She says that the area was leaking. I do not have too much more information on what was found 01/19/19 on evaluation today patient was actually being seen for a nurse visit when unfortunately she had the area on her left lateral lower extremity as well as weeping from the right lower extremity that became apparent. Therefore we did end up actually seeing her for a full visit with myself. She is having some pain at this site as well but fortunately nothing too significant at this point. No fevers, chills, nausea, or vomiting  noted at this time. 3/18-Patient is back [name] the clinic with the left leg venous leg ulcer, the ulcer is larger in size, has a surface that is densely adherent with fibrinous tissue, the Hydrofera Blue was used but is densely adherent and there was difficulty in removing it. The right lower extremity was also wrapped for weeping edema. Patient has a new area over the left lateral foot above the malleolus that is small and appears [name] have no debris with intact surrounding skin. Patient is on increased dose of Lasix also as a means [name] edema management 3/25; the patient has a nonhealing venous ulcer on the medial left leg and last week developed a smaller area on the lateral  left calf. We have been using Hydrofera Blue with a contact layer. 4/1; no major change in these wounds areas. Left medial and more recently left lateral calf. I tried Iodoflex last week [name] aid in debridement she did not tolerate this. She stated her pain was terrible all week. She took the top layer of the 4 layer compression off. 4/8; the patient actually looks somewhat better in terms of her more prominent left lateral calf wound. There is some healthy looking tissue here. She is still complaining of a lot of discomfort. 4/15; patient in a lot of pain secondary [name] sciatica. She is on a prednisone taper prescribed by her primary physician. She has the 2 areas one on the left medial and more recently a smaller area on the left lateral calf. Both of these just above the malleoli 4/22; her back pain is better but she still states she is very uncomfortable and now feels she is intolerant [name] the [employer]. No real change in the wounds we have been using Sorbact. She has been previously intolerant [name] Iodoflex. There is not a lot of option about what we can use [name] debride this wound under compression that she no doubt needs. sHe states Ultram no longer works for her pain 4/29; no major change in the wounds slightly increased depth. Surface on the original medial wound perhaps somewhat improved however the more recent area on the lateral left ankle is 100% covered in very adherent debris we have been using Sorbact. She tolerates 4 layer compression well and her edema control is a lot better. She has not had [name] come in for a nurse check 5/6; no major change in the condition of the wounds. She did consent [name] debridement today which was done with some difficulty. Continuing Sorbact. She did not tolerate Iodoflex. She was in for a check of her compression the day after we wrapped her last week this was adjusted but [name], [name]. ([id number]) nothing much was found 5/13; no major change in the condition or  area of the wounds. I was able [name] get a fairly aggressive debridement done on the lateral left leg wound. Even using Sorbact under compression. She came back in on Friday [name] have the wrap changed. She says she felt uncomfortable on the lateral aspect of her ankle. She has a long history of chronic venous insufficiency including previous ablation surgery on this side. 5/20-Patient returns for wounds on left leg with both wounds covered in slough, with the lateral leg wound larger in size, she has been in 3 layer compression and felt more comfortable, she describes pain in ankle, in leg and pins and needles in foot, and is about [name] try Pamelor for this 6/3; wounds on the left lateral and left medial leg.  The area medially which is the most recent of the 2 seems [name] have had the largest increase in dimensions. We have been using Sorbac [name] try and debride the surface. She has been [name] see orthopedics they apparently did a plain x-ray that was indeterminant. Diagnosed her with neuropathy and they have ordered an MRI [name] determine if there is underlying osteomyelitis. This was not high on my thought list but I suppose it is prudent. We have advised her [name] make an appointment with vein and vascular in [place]. She has a history of a left greater saphenous and accessory vein ablations I wonder if there is anything else that can be done from a surgical point of view [name] help in these difficult refractory wounds. We have previously healed this wound on one occasion but it keeps on reopening [medial side] 6/10; deep tissue culture I did last week I think on the left medial wound showed both moderate E. coli and moderate staph aureus [MSSA]. She is going [name] require antibiotics and I have chosen Augmentin. We have been using Sorbact and we have made better looking wound surface on both sides but certainly no improvement in wound area. She was back in last Friday apparently for a dressing changes the wrap was hurting  her outer left ankle. She has not managed [name] get a hold of vein and vascular in [place]. We are going [name] have [name] make her that appointment 6/17; patient is tolerating the Augmentin. She had an MRI that I think was ordered by orthopedic surgeon this did not show osteomyelitis or an abscess did suggest cellulitis. We have been using Sorbact [name] the lateral and medial ankles. We have been trying [name] arrange a follow-up appointment with vein and vascular in [place] or did her original ablations. We apparently an area sent the request [name] vein and vascular in [facility] 6/24; patient has completed the Augmentin. We do not yet have a vein and vascular appointment in [place]. I am not sure what the issue is here we have asked her [name] call tomorrow. We are using Sorbact. Making some improvements and especially the medial wound. Both surfaces however look better medial and lateral. 7/1; the patient has been in contact with vein and vascular in [place] but has not yet received an appointment. Using Sorbact we have gradually improve the wound surface with no improvement in surface area. She is approved for Apligraf but the wound surface still is not completely viable. She has not had [name] come in for a dressing change 7/8; the patient has an appointment with vein and vascular on 7/31 which is a Friday afternoon. She is concerned about getting back here for [place] [name] dress her wounds. I think it is important [name] have them goal for her venous reflux/history of ablations etc. [name] see if anything else can be done. She apparently tested positive for 1 of the blood tests with regards [name] lupus and saw a rheumatologist. He has raised the issue of vasculitis again. I have had this thought in the past however the evidence seems overwhelming that this is a venous reflux etiology. If the rheumatologist tells me there is clinical and laboratory investigation is positive for lupus I will rethink this. 7/15; the patient's  wound surfaces are quite a bit better. The medial area which was her original wound now has no depth although the lateral wound which was the more recent area actually appears larger. Both with viable surfaces which is indeed better. Using Sorbact. I  wanted [name] use Apligraf on her however there is the issue of the vein and vascular appointment on 7/31 at 2:00 in the afternoon which would not allow her [name] get back [name] be rewrapped and they would no doubt remove the graft 7/22; the patient's wound surfaces have moderate amount of debris although generally look better. The lateral one is larger with 2 small satellite areas superiorly. We are waiting for her vein and vascular appointment on 7/31. She has been approved for Apligraf which I would like [name] use after th 7/29; wound surfaces have improved no debridement is required we have been using Sorbact. She sees vein and vascular on Friday with this so question of whether anything can be done [name] lessen the likelihood of recurrence and/or speed the healing of these areas. She is already had previous ablations. She no doubt has severe venous hypertension 8/5-Patient returns at 1 week, she was in [place] for 3 days by her podiatrist, we have been using so backed [name] the wound, she has increased pain in both the wounds on the left lower leg especially the more distal one on the lateral aspect 8/12-Patient returns at 1 week and she is agreeable [name] having debridement in both wounds on her left leg today. We have been using Sorbact, and vascular studies were reviewed at last visit 8/19; the patient arrives with her wounds fairly clean and no debridement is required. We have used Sorbact which is really done a nice job in cleaning up these very difficult wound surfaces. The patient saw Dr. Donzetta Matters of vascular surgery on 7/31. He did not feel that there was an arterial component. He felt that her treated greater saphenous vein is adequately addressed and that the  small saphenous vein did not appear [name] be involved significantly. She was also noted [name] have deep venous reflux which is not treatable. Dr. Donzetta Matters mentioned the possibility of a central obstructive component leading [name] reflux and he offered her central venography. She wanted [name] discuss this or think about it. I have urged her [name] go ahead with this. She has had recurrent difficult wounds in these areas which do heal but after months in the clinic. If there is anything that can be done [name] reduce the likelihood of this I think it is worth it. 9/2 she is still working towards getting follow-up with Dr. Donzetta Matters [name] schedule her CT. Things are quite a bit worse venography. I put Apligraf on 2 weeks ago on both wounds on the medial and lateral part of her left lower leg. She arrives in clinic today with 3 superficial additional wounds above the area laterally and one below the wound medially. She describes a lot of discomfort. I think these are probably wrapped injuries. Does not look like she has cellulitis. 07/20/2019 on evaluation today patient appears [name] be doing somewhat poorly in regard [name] her lower extremity ulcers. She in fact showed signs of erythema in fact we may even be dealing with an infection at this time. Unfortunately I am unsure if this is just infection or if indeed there may be some allergic reaction that occurred as a result of the Apligraf application. With that being said that would be unusual but nonetheless not impossible in this patient is one who is unfortunately allergic [name] quite a bit. Currently we have been using the Sorbact which seems to do as well as anything for her. I do think we may want [name] obtain a culture today [name] see if  there is anything showing up there that may need [name] be addressed. 9/16; noted that last week the wounds look worse in 1 week follow-up of the Apligraf. Using Sorbact as of 2 days ago. She arrives with copious amounts of drainage and new skin breakdown on the  back of the left calf. The wounds arm more substantial bilaterally. There is a fair amount of swelling in the left calf no overt DVT there is edema present I think in the left greater than right thigh. She is supposed [name] go on 9/28 for CT venography. The wounds on the medial and lateral calf are worse and she has new skin breakdown posteriorly at least new for me. This is almost developing into a circumferential wound area The Apligraf was taken off last week which I agree with things are not going in the right direction a culture was done we do not have that back yet. She is on Augmentin that she started 2 days ago 9/23; dressing was changed by her nurses on Monday. In general there is no improvement in the wound areas although the area looks less angry than last week. She did get Augmentin for MSSA cultured on the 14th. She still appears [name] have too much swelling in the left leg even with 3 layer compression 9/30; the patient underwent her procedure on 9/28 by Dr. Donzetta Matters at vascular and vein specialist. She was discovered [name] have the common iliac vein measuring 12.2 mm but at the level of L4-L5 measured 3 mm. After stenting it measured 10 mm. 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 Rouleaux flow in the common femoral and femoral vein was observed much improved after stenting. We are using silver alginate [name] the wounds on the medial and lateral ankle on the left. 4 layer compression [name], [name] ([id number]) 10/7; the patient had fluid swelling around her knee and 4 layer compression. At the advice of vein and vascular this was reduced [name] 3 layer which she is tolerating better. We have been using silver alginate under 3 layer compression since last Friday 10/14; arrives with the areas on the left ankle looking a lot better. Inflammation in the area also a lot better. She came in for a nurse check on 10/9 10/21; continued nice improvement. Slight improvements in surface area of  both the medial and lateral wounds on the left. A lot of the satellite lesions in the weeping erythema around these from stasis dermatitis is resolved. We have been using silver alginate 10/28; general improvement in the entire wound areas although not a lot of change in dimensions the wound certainly looks better. There is a lot less in terms of venous inflammation. Continue silver alginate this week however look towards Hydrofera Blue next week 11/4; very adherent debris on the medial wound left wound is not as bad. We have been using silver alginate. Change [name] [employer] today 11/11; very adherent debris on both wound areas. She went [name] vein and vascular last week and follow-up they put in [place] boot on this today. He says the [facility] was adherent. Wound is definitely not as good as last week. Especially on the left there the satellite lesions look more prominent 11/18; absolutely no better. erythema on lateral aspect with tenderness. 09/30/2019 on evaluation today patient appears [name] actually be doing better. Dr. Dellia Nims did put her on doxycycline last week which I do believe has helped her at this point. Fortunately there is no signs of active  infection at this time. No fevers, chills, nausea, vomiting, or diarrhea. I do believe he may want extend the doxycycline for 7 additional days just [name] ensure everything does completely cleared up the patient is in agreement with that plan. Otherwise she is going require some sharp debridement today 12/2; patient is completing a 2-week course of doxycycline. I gave her this empirically for inflammation as well as infection when I last saw her 2 weeks ago. All of this seems [name] be better. She is using silver alginate she has the area on the medial aspect of the larger area laterally and the 2 small satellite regions laterally above the major wound. 12/9; the patient's wound on the left medial and left lateral calf look really quite good. We have been  using silver alginate. She saw vein and vascular in follow-up on 10/09/2019. She has had a previous left greater saphenous vein ablation by Dr. Oscar La in 2016. More recently she underwent a left common iliac vein stent by Dr. Donzetta Matters on 08/04/2019 due [name] May Thurner type lesions. The swelling is improved and certainly the wounds have improved. The patient shows [place] today area on the right medial calf there is almost no wound but leaking lymphedema. She says she start this started 3 or 4 days ago. She did not traumatize it. It is not painful. She does not wear compression on that side 12/16; the patient continues to do well laterally. Medially still requiring debridement. The area on the right calf did not materialize [name] anything and is not currently open. We wrapped this last time. She has support stockings for that leg although I am not sure they are going [name] provide adequate compression 12/23; the lateral wound looks stable. Medially still requiring debridement for tightly adherent fibrinous debris. We've been using silver alginate. Surface area not any different 12/30; neither wound is any better with regards [name] surface and the area on the left lateral is larger. I been using silver alginate [name] the left lateral which look quite good last week and Sorbact [name] the left medial 11/11/2019. Lateral wound area actually looks better and somewhat smaller. Medial still requires a very aggressive debridement today. We have been using Sorbact on both wound areas 1/13; not much better still adherent debris bilaterally. I been using Sorbact. She has severe venous hypertension. Probably some degree of dermal fibrosis distally. I wonder whether tighter compression might help and I am going [name] try that today. We also need [name] work on the bioburden 1/20; using Sorbact. She has severe venous hypertension status post stent placement for pelvic vein compression. We applied gentamicin last time [name] see if we could reduce  bioburden I had some discussion with her today about the use of pentoxifylline. This is occasionally used in this setting for wounds with refractory venous insufficiency. However this interacts with Plavix. She tells me that she was put on this after stent placement for 3 months. She will call Dr. Claretha Cooper office [name] discuss 1/27; we are using gentamicin under Sorbact. 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 She has a stent. Wound medially is measuring smaller this week. Laterally measuring slightly larger although she has some satellite lesions superiorly 2/3; gentamicin under Sorbact under 4-layer compression. 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 She has a stent on Plavix. Her wounds are measuring smaller this week. More substantially laterally where there is a satellite lesion superiorly. 2/10; gentamicin under Sorbac. 4-layer compression. Patient communicated with Dr. Donzetta Matters at  vein and vascular in [place]. He is okay with the patient coming off Plavix I will therefore start her on pentoxifylline for a 1 month trial. In general her wounds look better today. I had some concerns about swelling in the left thigh however she measures 61.5 on the right and 63 on the mid thigh which does not suggest there is any difficulty. The patient is not describing any pain. 2/17; gentamicin under Sorbac 4-layer compression. She has been on pentoxifylline for 1 week and complains of loose stool. No nausea she is eating and drinking well 2/24; the patient apparently came in 2 days ago for a nurse visit when her wrap fell down. Both areas look a little worse this week macerated medially and satellite lesions laterally. Change [name] silver alginate today 3/3; wounds are larger today especially medially. She also has more swelling in her foot lower leg and I even noted some swelling in her posterior thigh which is tender. I wonder about the patency of her stent.  Fortuitously she sees Dr. Claretha Cooper group on Friday 3/10; Mrs. Gasner was seen by vein and vascular on 3/5. The patient underwent ultrasound. There was no evidence of thrombosis involving the IVC no evidence of thrombosis involving the right common iliac vein there is no evidence of thrombosis involving the right external iliac vein the left external vein is also patent. The right common iliac vein stent appears patent bilateral common femoral veins are compressible and appear patent. I was concerned about the left common iliac stent however it looks like this is functional. She has some edema in the posterior thigh that was tender she still has that this week. I also note they had trouble finding the pulses in her left foot and booked her for an ABI baseline in 4 weeks. She will follow up in 6 months for repeat IVC duplex. The patient stopped the pentoxifylline because of diarrhea. It does not look like that was being effective in any case. I have advised her [name] go back on her aspirin 81 mg tablet, vascular it also suggested this 3/17; comes in today with her wound surfaces a lot better. The excoriations from last week considerably better probably secondary [name] the TCA. We have been using silver alginate 3/24; comes in today with smaller wounds both medially and laterally. Both required debridement. There are 2 small satellite areas superiorly laterally. She also has a very odd bandlike area in the mid calf almost looking like there was a weakness in the wrap in a localized area. I would write this off as being this however anteriorly she has a small raised ballotable area that is very tender almost reminiscent of an abscess but there was no [name], [name]. ([id number]) obvious purulent surface [name] it. 02/04/20 upon evaluation today patient appears [name] be doing fairly well in regard [name] her wounds today. Fortunately there is no signs of active infection at this time. No fevers, chills, nausea, vomiting,  or diarrhea. She has been tolerating the dressing changes without complication. Fortunately I feel like she is showing signs of improvement although has been sometime since have seen her. Nonetheless the area of concern that Dr. Dellia Nims had last week where she had possibly an area of the wrap that was we can allow the leg [name] bulge appears [name] be doing significantly better today there is no signs of anything worsening. 4/7; the patient's wounds on her medial and lateral left leg continue [name] contract. We have been using a regular alginate.  Last week she developed an area on the right medial lower leg which is probably a venous ulcer as well. 4/14; the wounds on her left medial and lateral lower leg continue [name] contract. Surface eschar. We have been using regular alginate. The area on the right medial lower leg is closed. We have been putting both legs under 4-layer contraction. The patient went back [name] see vein and vascular she had arterial studies done which were apparently "quite good" per the patient although I have not read their notes I have never felt she had an arterial issue. The patient has refractory lymphedema secondary [name] severe chronic venous insufficiency. This is been longstanding and refractory [name] exercise, leg elevation and longstanding use of compression wraps in our clinic as well as compression stockings on the times we have been able [name] get these [name] heal 4/21; we thought she actually might be close this week however she arrives in clinic with a lot of edema in her upper left calf and into her posterior thigh. This is been an intermittent problem here. She says the wrap fell down but it was replaced with a nurse visit on Monday. We are using calcium alginate [name] the wounds and the wound sizes there not terribly larger than last week but there is a lot more edema 4/28; again wound edges are smaller on both sides. Her edema is better controlled than last time. She is obtained her  compression pumps from [employer] although they have not been [name] her home [name] set these up. 5/5; left medial and left lateral both look stable. I am not sure the medial is any smaller. We have been using calcium alginate under 4-layer compression. She had an area on the right medial. This was eschared today. We have been wrapping this as well. She does not tolerate external compression stockings due [name] a history of various contact allergies. She has her compression pumps however the representative from the company is coming on her [name] show her how [name] use these tomorrow 5/19; patient with severe chronic venous insufficiency secondary [name] central venous disease. She had a stent placed in her left common iliac vein. She has done better since but still difficult [name] control wounds. She comes in today with nothing open on the right leg. Her areas on the left medial and left lateral are just about closed. We are using calcium alginate under 4-layer compression. She is using her external compression pumps at home She only has 15-20 support stockings. States she cannot get anything tighter than that on. 03/30/20-Patient returns at 1 week, the wounds on the left leg are both slightly bigger, the last week she was on 3 layer compression which started [name] slide down. She is starting [name] use her lymphedema pumps although she stated on 1 day her right ankle started [name] swell up and she have [name] stop that day. Unfortunately the open area seem [name] oscillate between improving [name] the point of healing and then flaring up all to do with effectiveness of compression or lack of due [name] the left leg topography not keeping the compression wraps from rolling down 6/2 patient comes in with a 15/20 mmHg stocking on the right leg. She tells me that she developed a lot of swelling in her ankles she saw orthopedics she was felt [name] possibly be having a flare of pseudogout versus some other type of arthritis. She was put on steroids  for a respiratory issue so that helps with the inflammation. She  has not been using the pumps all week. She thinks the left thigh is more swollen than usual and I would agree with that. She has an appointment with Dr. Donzetta Matters 9 days or so from now 6/9; both wounds on the left medial and left lateral are smaller. We have been using calcium alginate under compression. She does not have an open wound on the right leg she is using a stocking and her compression pumps things are going well. She has an appointment with Dr. Donzetta Matters with regards [name] her stent in the left common iliac vein 6/16; the wounds on the left medial and left lateral ankle continues [name] contract. The patient saw Dr. Donzetta Matters and I think he seems satisfied. Ordered follow-up venous reflux studies on both sides in September. Cautioned that she may need thigh-high stockings. She has been using calcium alginate under compression on the left and her own stocking on the right leg. She tells [place] there are no open wounds on the right 6/23; left lateral is just about closed. Medial required debridement today. We have been using calcium alginate. Extensive discussion about the compression pumps she is only using these on 25 mmHg states she could not take 40 or 30 when the wrap came out [name] her home [name] demonstrate these. He said they should not feel tight 6/30; the left lateral wound has a slight amount of eschar. . The area medially is about the same using Hydrofera Blue. 7/7; left lateral wound still has some eschar. I will remove this next week may be closed. The area medially is very small using Hydrofera Blue with improvement. Unfortunately the stockings fell down. Unfortunately the blisters have developed at the edge of where the wrap fell. When this happened she says her legs hurt she did not use her pumps. We are not open Monday for her [name] come in and change the wraps and she had an appointment yesterday. She also tells me that she is going [name] have  an MRI of her back. She is having pain radiating into her left anterior leg she thinks her from an L5 disc. She saw Dr. Ellene Route of neurosurgery 7/14; the area on the left lateral ankle area is closed. Still a small area medially however it looks better as well. We have been using Hydrofera Blue under 4-layer compression 7/21; left lateral ankle is still closed however her wound on the medial left calf is actually larger. This is probably because Hydrofera Blue got stuck [name] the wound. She came in for a nurse change on Friday and will do that again this week I was concerned about the amount of swelling that she had last week however she is using her compression pumps twice a day and the swelling seems well controlled 7/28; remaining wound on the left medial lower leg is smaller. We have been using moistened silver collagen under compression she is coming back for a nurse visit. For reasons that were not really clear she was just keeping her legs elevated and not using her compression pumps. I have asked her [name] use the compression pumps. She does not have any wounds on the right leg 06/15/20-Patient returns at 2 weeks, her LLE edema is worse and she developed a blister wound that is new and has bigger posterior calf wound on right, we are using Prisma with pad, 4 layer compression. she has been on lasix 40 mg daily 8/18; patient arrives today with things a lot worse than I remember from a few  weeks ago. She was seen last week. Noted that her edema was worse and that she now had a left lateral wound as well as deteriorating edema in the medial and posterior part of the lower leg. She says she is using his or her external compression pumps once a day although I wonder about the compliance. 8/25; weeping area on the right medial lower leg. This had actually gotten a small localized area of her compression stocking wet. On the left side there is a large denuded area on the posterior medial lower leg and  smaller area on the lateral. This was not the original areas that we dealt with. 9/1 the patient's wound on the left leg include the left lateral and left posterior. Larger superficial wounds weeping. She has very poor edema control. Tender localized edema in the left lower medial ankle/heel probably because of localized wrap issues. She freely admits she is not using [name], [place] ([id number]) the compression pumps. She has been up on her feet a lot. She thinks the hydrofera blue is contributing [name] the pain she is experiencing.. This is a complaint that I have occasionally heard 9/8; really not much improvement. The patient is still complaining of a lot of pain particularly when she uses compression pumps. I switched her [name] silver alginate last time because she found the Hydrofera Blue [name] be irritating. I don't hear much difference in her description with the silver alginate. 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 She has follow-up with Dr. Donzetta Matters I think for ultrasound next month 9/15; quite a bit of improvement today. We have less edema and more epithelialization in both of her wound areas on the left medial and left lateral calf. These are not the site of her original wounds in this area. She says she has been using her compression pumps for 30 minutes twice a day, there pain issues that never quite understood. Silver alginate as the primary dressing 9/22; continued improvement. Both areas medially and laterally still have a small open area there is some eschar. She continues [name] complain of left medial ankle pain. Swelling in the leg is in much better condition. We have been using silver alginate 9/29; continued improvement. Both areas medially and laterally in the left calf look as though they are close some minor surface eschar but I think this is epithelialized. She comes in today saying she has a ruptured disc  at L4-L5 cannot bend over [name] put on her stockings. 10/6; patient comes in today with no open wounds on either leg. However her edema on the left leg in the upper one third of the lower leg is poorly controlled nonpitting. She says that she could not use the pumps for 2 days and then she has been using the last couple of days. It is not clear [name] me she has been able [name] get her stocking on. She has back problems. Mrs. Cabriales has severe chronic venous insufficiency with secondary lymphedema. Her venous insufficiency is partially centrally mediated and that she is now post stent in the left common iliac vein. [facility] Thurner's syndrome/physiology]. She follows up with them on 10/15. She wears 20/30 below-knee stockings. She is supposed [name] use compression pumps at home although I think her compliance about with this is been less than 100%. I have asked her [name] use these 3 times a day. Finally I think she has lipodermatosclerosis in  the left lower leg with an inverted bottle sign. It is been a major problem controlling the edema in the left leg. The right leg we have had wounds on but not as significant a problem is on the left READMISSION 04/12/2021 Mrs. Kronberg is a 75 year old woman we know well in this clinic. She has severe chronic venous insufficiency. 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 I believe she has had bilateral greater saphenous vein ablation in the past as well. She tells me that this wound opened sometime in March. She had a fall and thinks it was initially abrasion. She developed areas she describes as little blisters on the anterior part of her leg and she saw dermatology and was treated for methicillin staph aureus with several rounds of antibiotics. She has been using support stockings on the left leg and says this is the only thing she can get on. Her compression pump use maybe once a day she says if she did not use one she use the other. She  comes in today with incredible swelling in the left leg with a wound on the left posterior calf. She has been using Neosporin [name] this previously a hydrocolloid. 6/15; patient arrives back for 1 week follow-up.Marland Kitchen Apparently her wrap fell down she did not call us [name] replace this. He has poor edema control. She only uses her compression pumps once a day 6/29; patient presents for 1 week follow-up. She has tolerated the compression wrap well. We have been using Iodoflex under the wrap. She has no issues or complaints today. 7/6; patient presents for 1 week follow-up. She states that the compression wrap rolled down her leg 4 days ago. She has been trying [name] keep the area covered but has no dressings at home [name] use. She denies signs of infection. 7/13; patient presents for 1 week follow-up. She states that her compression wrap rolled down her right leg and she called our office and had it placed a few days ago. She has been tolerating the current wrap well. She states that the Iodoflex is causing a burning sensation. She denies signs of infection. 7/27; patient presents for 1 week follow-up She has tolerated the compression wrap well with Hydrofera Blue underneath. She has now developed a wound [name] her right lower extremity. She reports having a culture done [name] this area by her dermatologist that she reports is negative. She currently denies signs of infection. 8/30; patient presents for 1 week follow-up. On the left side she has tolerated the compression wrap well with Hydrofera Blue underneath. She reports some discomfort [name] her right lower extremity with the 3 layer compression. She currently denies signs of infection. 06/14/2021 upon evaluation today patient's wound actually appears [name] be doing decently well based on what I am seeing currently. She actually has 2 areas 1 on the left distal/posterior lower leg and the other on the right lower leg. Subsequently the measurements are roughly the same may  be slightly smaller but in general have not made any trend towards getting worse which is great news. 06/23/2021 upon evaluation today patient appears [name] be doing pretty well in regard [name] her wounds. On the right she is having difficulty with sciatic pain and this subsequently has led [name] her taking the wrap off over the last week her leg is more swollen she is also been on prednisone for 10 days. With that being said I think we may want [name] just  use a compression sock on the left that way she will not have [name] worry about the compression wrap being in place that she cannot get off. With that being said I do believe as well that the patient is going [name] need [name] continue with the wrapping on the left we will also need to do a little bit of sharp debridement today. 8/24; patient presents for 1 week follow-up. She has no issues or complaints today. She denies signs of infection. 8/31; patient presents for 1 week follow-up. She has no issues or complaints today. She has tolerated the compression wrap well on the left lower extremity. She denies signs of infection. 9/7; patient presents for follow-up. She reports that the compression wrap rolled down slightly on her right lower extremity. She reports tenderness [name] the wound bed. She denies increased warmth or erythema [name] the surrounding skin. She overall feels well. 9/14; patient presents for follow-up. She has no issues or complaints today. She denies signs of infection. PuraPly is available and patient would like [name] start this today. 9/21; patient presents for follow-up. She has no issues or complaints today. She tolerated the first skin substitute placement last week under compression. 9/28; patient presents for 1 week follow-up. She has no issues or complaints today. [name], [name] ([id number]) 10/5; patient presents for 1 week follow-up. She reports taking the wrap off yesterday. She was on her feet more yesterday and developed more swelling [name] her  left lower extremity. She denies pain. Overall she is doing well. 10/12; patient presents for 1 week follow-up. She has no issues or complaints today. 10/19; patient presents for follow-up. She has no issues or complaints today. She tolerated the compression wrap well. She states she has compression stockings at home. Patient History Information obtained from Patient. Family History Cancer - Mother,Paternal Grandparents, Hypertension - Mother,Father, Kidney Disease - Paternal Grandparents, Lung Disease - Paternal Grandparents, No family history of Diabetes, Heart Disease, Hereditary Spherocytosis, Seizures, Stroke, Thyroid Problems, Tuberculosis. Social History Former smoker - quit at age 53 - ended on 04/19/1966, Marital Status - Divorced, Alcohol Use - Never, Drug Use - No History, Caffeine Use - Daily. Medical History Eyes Patient has history of Cataracts Denies history of Glaucoma, Optic Neuritis Ear/Nose/Mouth/Throat Denies history of Chronic sinus problems/congestion, Middle ear problems Hematologic/Lymphatic Denies history of Anemia, Hemophilia, Human Immunodeficiency Virus, Lymphedema, Sickle Cell Disease Respiratory Patient has history of Asthma, Sleep Apnea Denies history of Aspiration, Chronic Obstructive Pulmonary Disease (COPD), Pneumothorax, Tuberculosis Cardiovascular Patient has history of Deep Vein Thrombosis - LLE 2010, Hypertension, Peripheral Venous Disease Denies history of Angina, Arrhythmia, Congestive Heart Failure, Coronary Artery Disease, Hypotension, Myocardial Infarction, Peripheral Arterial Disease, Phlebitis, Vasculitis Gastrointestinal Denies history of Cirrhosis , Colitis, Crohn s, Hepatitis A, Hepatitis B, Hepatitis C Endocrine Denies history of Type I Diabetes, Type II Diabetes Genitourinary Denies history of End Stage Renal Disease Immunological Denies history of Lupus Erythematosus, Raynaud s, Scleroderma Integumentary (Skin) Denies history of  History of Burn, History of pressure wounds Musculoskeletal Patient has history of Osteoarthritis - neck Denies history of Gout, Rheumatoid Arthritis, Osteomyelitis Neurologic Denies history of Dementia, Neuropathy, Quadriplegia, Paraplegia, Seizure Disorder Oncologic Patient has history of Received Chemotherapy - interfeon immunotherapy, Received Radiation Psychiatric Denies history of Anorexia/bulimia, Confinement Anxiety Hospitalization/Surgery History - Melanoma left shoulder. Medical And Surgical History Notes Constitutional Symptoms (General Health) Vein ablation LLE 2010 Vascular Sx ( vein ablationo) LLE 02/2015 Eyes Horners syndrome Genitourinary Kidney stones Neurologic ct scan showed swollen lymph nodes Oncologic  Melanoma (R) shoulder 07/2010; (R) axillary lymphnode removal Objective CESILY, CUOCO. ([id number]) Constitutional Vitals Time Taken: 3:31 PM, Height: 63 in, Weight: 212 lbs, BMI: 37.6, Temperature: 98.8 F, Pulse: 85 bpm, Respiratory Rate: 18 breaths/min, Blood Pressure: 171/89 mmHg. General Notes: Left lower extremity: [name] the distal medial aspect there is epithelialization [name] the previous wound site. Dry flaky skin noted. No signs of infection. Assessment Active Problems ICD-10 Chronic venous hypertension (idiopathic) with ulcer and inflammation of left lower extremity Lymphedema, not elsewhere classified Non-pressure chronic ulcer of left calf limited [name] breakdown of skin Patient has done well with compression wraps over the past 2 weeks [name] assure that the wound stayed closed. She has compression stockings at home. I recommended using these daily. She may follow-up as needed. Plan Discharge From [facility] Services: Discharge from [place] [facility] Complete Wear compression garments daily. Put garments on first thing when you wake up and remove them before bed. Moisturize legs daily after removing compression garments. 1. Discharge from clinic due  [name] closed wound 2. Follow-up as needed 3. Use compression stockings daily Electronic Signature(s) Signed: 08/23/2021 4:19:50 PM By: Kalman Shan DO Entered By: Kalman Shan on 08/23/2021 16:19:15 [place], [name] ([id number]) -------------------------------------------------------------------------------- ROS/PFSH Details Patient Name: DIANTHA, PAXSON. Date of Service: 08/23/2021 3:30 PM Medical Record Number: [id number] Patient Account Number: [id number] Date of Birth/Sex: [birth year] (75 y.o. F) Treating RN: Dolan Amen Primary Care Provider: [name] Other Clinician: Referring Provider: [name] Treating Provider/Extender: Yaakov Guthrie in Treatment: 19 Label Progress Note Print Version as History and Physical for this encounter Information Obtained From Patient Constitutional Symptoms (General Health) Medical History: Past Medical History Notes: Vein ablation LLE 2010 Vascular Sx ( vein ablationo) LLE 02/2015 Eyes Medical History: Positive for: Cataracts Negative for: Glaucoma; Optic Neuritis Past Medical History Notes: Horners syndrome Ear/Nose/Mouth/Throat Medical History: Negative for: Chronic sinus problems/congestion; Middle ear problems Hematologic/Lymphatic Medical History: Negative for: Anemia; Hemophilia; Human Immunodeficiency Virus; Lymphedema; Sickle Cell Disease Respiratory Medical History: Positive for: Asthma; Sleep Apnea Negative for: Aspiration; Chronic Obstructive Pulmonary Disease (COPD); Pneumothorax; Tuberculosis Cardiovascular Medical History: Positive for: Deep Vein Thrombosis - LLE 2010; Hypertension; Peripheral Venous Disease Negative for: Angina; Arrhythmia; Congestive Heart Failure; Coronary Artery Disease; Hypotension; Myocardial Infarction; Peripheral Arterial Disease; Phlebitis; Vasculitis Gastrointestinal Medical History: Negative for: Cirrhosis ; Colitis; Crohnos; Hepatitis A; Hepatitis B; Hepatitis  C Endocrine Medical History: Negative for: Type I Diabetes; Type II Diabetes Genitourinary Medical History: Negative for: End Stage Renal Disease Past Medical History Notes: Kidney stones FANNYE, MYER ([id number]) Immunological Medical History: Negative for: Lupus Erythematosus; Raynaudos; Scleroderma Integumentary (Skin) Medical History: Negative for: History of Burn; History of pressure wounds Musculoskeletal Medical History: Positive for: Osteoarthritis - neck Negative for: Gout; Rheumatoid Arthritis; Osteomyelitis Neurologic Medical History: Negative for: Dementia; Neuropathy; Quadriplegia; Paraplegia; Seizure Disorder Past Medical History Notes: ct scan showed swollen lymph nodes Oncologic Medical History: Positive for: Received Chemotherapy - interfeon immunotherapy; Received Radiation Past Medical History Notes: Melanoma (R) shoulder 07/2010; (R) axillary lymphnode removal Psychiatric Medical History: Negative for: Anorexia/bulimia; Confinement Anxiety HBO Extended History Items Eyes: Cataracts Immunizations Pneumococcal Vaccine: Received Pneumococcal Vaccination: Yes Received Pneumococcal Vaccination On or After 60th Birthday: No Immunization Notes: tetanus shot w/in the last 5 years per pt Implantable Devices None Hospitalization / Surgery History Type of Hospitalization/Surgery Melanoma left shoulder Family and Social History Cancer: Yes - Mother,Paternal Grandparents; Diabetes: No; Heart Disease: No; Hereditary Spherocytosis: No; Hypertension: Yes - Mother,Father; Kidney Disease: Yes - Paternal Grandparents; Lung  Disease: Yes - Paternal Grandparents; Seizures: No; Stroke: No; Thyroid Problems: No; Tuberculosis: No; Former smoker - quit at age 14 - ended on 04/19/1966; Marital Status - Divorced; Alcohol Use: Never; Drug Use: No History; Caffeine Use: Daily; Financial Concerns: No; Food, Clothing or Shelter Needs: No; Support System Lacking: No;  Transportation Concerns: No Electronic Signature(s) Signed: 08/23/2021 4:16:56 PM By: Dolan Amen RN Signed: 08/23/2021 4:19:50 PM By: Kalman Shan DO Entered By: Kalman Shan on 08/23/2021 [place], [name] ([id number]) -------------------------------------------------------------------------------- SuperBill Details Patient Name: KAYDI, KLEY. Date of Service: 08/23/2021 Medical Record Number: [id number] Patient Account Number: [id number] Date of Birth/Sex: [birth year] (75 y.o. F) Treating RN: Dolan Amen Primary Care Provider: [name] Other Clinician: Referring Provider: [name] Treating Provider/Extender: Yaakov Guthrie in Treatment: 19 Diagnosis Coding ICD-10 Codes Code Description [phone] Chronic venous hypertension (idiopathic) with ulcer and inflammation of left lower extremity I89.0 Lymphedema, not elsewhere classified L97.221 Non-pressure chronic ulcer of left calf limited [name] breakdown of skin Facility Procedures CPT4 Code: [id number] Description: [phone] - WOUND CARE VISIT-LEV 2 EST PT Modifier: Quantity: 1 Physician Procedures CPT4 Code Description: [id number] - WC PHYS LEVEL 3 - EST PT Modifier: Quantity: 1 CPT4 Code Description: ICD-10 Diagnosis Description I87.332 Chronic venous hypertension (idiopathic) with ulcer and inflammation of I89.0 Lymphedema, not elsewhere classified L97.221 Non-pressure chronic ulcer of left calf limited [name] breakdown of skin Modifier: left lower extremity Quantity: Electronic Signature(s) Signed: 08/23/2021 4:19:50 PM By: Kalman Shan DO Previous Signature: 08/23/2021 4:16:56 PM Version By: Dolan Amen RN Entered By: Kalman Shan on 08/23/2021 16:19:29

## 2021-08-24 ENCOUNTER — Ambulatory Visit (HOSPITAL_COMMUNITY)
Admission: RE | Admit: 2021-08-24 | Discharge: 2021-08-24 | Disposition: A | Discharge status: Home / Self Care | Payer: Medicare Other | Source: Ambulatory Visit | Attending: Family | Admitting: Family

## 2021-08-24 ENCOUNTER — Other Ambulatory Visit: Payer: Self-pay

## 2021-08-24 ENCOUNTER — Telehealth: Payer: Self-pay | Admitting: *Deleted

## 2021-08-24 DIAGNOSIS — C4361 Malignant melanoma of right upper limb, including shoulder: Secondary | ICD-10-CM | POA: Diagnosis not present

## 2021-08-24 DIAGNOSIS — Z85820 Personal history of malignant melanoma of skin: Secondary | ICD-10-CM | POA: Diagnosis not present

## 2021-08-24 DIAGNOSIS — G9389 Other specified disorders of brain: Secondary | ICD-10-CM | POA: Diagnosis not present

## 2021-08-24 DIAGNOSIS — R262 Difficulty in walking, not elsewhere classified: Secondary | ICD-10-CM | POA: Diagnosis not present

## 2021-08-24 DIAGNOSIS — R5383 Other fatigue: Secondary | ICD-10-CM | POA: Diagnosis not present

## 2021-08-24 MED ORDER — GADOBUTROL 1 MMOL/ML IV SOLN
9.0000 mL | Freq: Once | INTRAVENOUS | Status: AC | PRN
  Administered 2021-08-24: 9 mL via INTRAVENOUS

## 2021-08-24 NOTE — Telephone Encounter (Signed)
-----   Message from Celso Amy, NP sent at 08/24/2021  1:48 PM EDT ----- No evidence of metastatic disease. Needs to follow-up with her neurosurgeon Dr. Ellene Route and PCP for further workup for symptoms. Thank you.   Sarah  ----- Message ----- From: Buel Ream, Rad Results In Sent: 08/24/2021   9:31 AM EDT To: Celso Amy, NP

## 2021-08-24 NOTE — Telephone Encounter (Signed)
Lvm returning pt's call.

## 2021-08-24 NOTE — Telephone Encounter (Signed)
Called to notify pt of results. Unable to reach at this time. Request pt call office.

## 2021-08-25 ENCOUNTER — Telehealth: Payer: Self-pay | Admitting: *Deleted

## 2021-08-25 DIAGNOSIS — S32030A Wedge compression fracture of third lumbar vertebra, initial encounter for closed fracture: Secondary | ICD-10-CM | POA: Insufficient documentation

## 2021-08-25 DIAGNOSIS — M5146 Schmorl's nodes, lumbar region: Secondary | ICD-10-CM | POA: Insufficient documentation

## 2021-08-25 NOTE — Telephone Encounter (Signed)
Lvm returning pt's call.  Attempted several times to contact pt with no response.  Closing encounter.  Pt needs AWV scheduled.

## 2021-08-25 NOTE — Assessment & Plan Note (Signed)
Ongoing lower back pain with R sciatica associated with subjective unsteadiness/weakness of R thigh although exam largely reassuring. Tried and failed multiple medications including opiate and muscle relaxant. H/o remote lumbar laminectomy. Last MRI 2021 showing DDD, moderate spinal stenosis at L3/4 as well as asymmetric R sided subarticular recess narrowing.  Will check lumbar films and consider lumbar MRI given endorsed subjective R leg weakness.

## 2021-08-25 NOTE — Telephone Encounter (Signed)
Pt notified per order of S. Eulas Post NP that there is "No evidence of metastatic disease. Needs to follow-up with her neurosurgeon Dr. Ellene Route and PCP for further workup for symptoms. Thank you."  Pt appreciative of call and has no questions or concerns at this time.

## 2021-08-25 NOTE — Assessment & Plan Note (Signed)
Incidental finding on recent xrays - update MRI.

## 2021-08-25 NOTE — Telephone Encounter (Signed)
Pt returning call.  Plz schedule medicare cpe and lab visits.

## 2021-08-25 NOTE — Assessment & Plan Note (Addendum)
No significant upper extremity symptoms.  ?component of stenosis contributing to subjective RLE weakness.  Update cervical films.

## 2021-08-28 NOTE — Progress Notes (Signed)
Noted  

## 2021-08-30 ENCOUNTER — Encounter: Payer: Medicare Other | Admitting: Internal Medicine

## 2021-08-30 NOTE — Telephone Encounter (Signed)
LMTCB to schedule a cpe and labs

## 2021-09-08 ENCOUNTER — Ambulatory Visit: Payer: Medicare Other | Admitting: Family Medicine

## 2021-09-13 ENCOUNTER — Ambulatory Visit (INDEPENDENT_AMBULATORY_CARE_PROVIDER_SITE_OTHER): Payer: Medicare Other | Admitting: Family Medicine

## 2021-09-13 ENCOUNTER — Encounter: Payer: Self-pay | Admitting: Family Medicine

## 2021-09-13 ENCOUNTER — Telehealth: Payer: Self-pay

## 2021-09-13 ENCOUNTER — Telehealth: Payer: Self-pay | Admitting: Family Medicine

## 2021-09-13 ENCOUNTER — Other Ambulatory Visit: Payer: Self-pay

## 2021-09-13 VITALS — BP 148/84 | HR 69 | Temp 97.5°F | Ht 63.0 in | Wt 203.1 lb

## 2021-09-13 DIAGNOSIS — E611 Iron deficiency: Secondary | ICD-10-CM | POA: Diagnosis not present

## 2021-09-13 DIAGNOSIS — R413 Other amnesia: Secondary | ICD-10-CM

## 2021-09-13 DIAGNOSIS — S32030A Wedge compression fracture of third lumbar vertebra, initial encounter for closed fracture: Secondary | ICD-10-CM

## 2021-09-13 DIAGNOSIS — I10 Essential (primary) hypertension: Secondary | ICD-10-CM | POA: Diagnosis not present

## 2021-09-13 DIAGNOSIS — G8929 Other chronic pain: Secondary | ICD-10-CM | POA: Diagnosis not present

## 2021-09-13 DIAGNOSIS — M40202 Unspecified kyphosis, cervical region: Secondary | ICD-10-CM | POA: Diagnosis not present

## 2021-09-13 DIAGNOSIS — R4189 Other symptoms and signs involving cognitive functions and awareness: Secondary | ICD-10-CM | POA: Insufficient documentation

## 2021-09-13 DIAGNOSIS — M5441 Lumbago with sciatica, right side: Secondary | ICD-10-CM | POA: Diagnosis not present

## 2021-09-13 DIAGNOSIS — M545 Low back pain, unspecified: Secondary | ICD-10-CM

## 2021-09-13 LAB — POC URINALSYSI DIPSTICK (AUTOMATED)
Bilirubin, UA: NEGATIVE
Blood, UA: NEGATIVE
Glucose, UA: NEGATIVE
Leukocytes, UA: NEGATIVE
Nitrite, UA: NEGATIVE
Protein, UA: POSITIVE — AB
Spec Grav, UA: 1.02 (ref 1.010–1.025)
Urobilinogen, UA: 0.2 E.U./dL
pH, UA: 6.5 (ref 5.0–8.0)

## 2021-09-13 NOTE — Telephone Encounter (Signed)
Pt was seen by Dr. Darnell Level today and was taken to lab for blood work but did not stay to have it done.  Attempted to call pt. No answer.  No vm.  Pt needs to return to [facility] or go to [facility] to have blood work today.  If not, pt will need to schedule a lab visit.

## 2021-09-13 NOTE — Telephone Encounter (Signed)
Please call - we checked to see if [facility] could see her sooner for MRI however they're booked. However they suggested she call every morning to see if there's a cancellation and she could go sooner than next Wednesday. Would recommend she try this.  [phone]

## 2021-09-13 NOTE — Progress Notes (Signed)
Patient ID: [name], female    DOB: [birth year], 75 y.o.   MRN: [id number]  This visit was conducted in person.  BP (!) 148/84   Pulse 69   Temp (!) 97.5 F (36.4 C) (Temporal)   Ht 5\' 3"  (1.6 m)   Wt 203 lb 2 oz (92.1 kg)   SpO2 96%   BMI 35.98 kg/m    CC: ongoing leg pain, memory loss Subjective:   HPI: [name] is a 75 y.o. female presenting on 09/13/2021 for Memory Loss (C/o memory and confusion, comes and goes.  Started about 3 mos ago. ) and Leg Pain (C/o right leg pain/weakness.  Started about 3 mos ago. )   See prior note for details.  3 mo h/o R leg pain, weakness. Lumbar xrays from last month showed old L3 compression fracture that was new since MRI from 06/2020. Also showed mild-moderate arthritis changes most at L4/5. Last visit we ordered MR lumbar spine - MRI is scheduled for 09/20/2021. Planning to see neurosurgery Dr Ellene Route on 09/22/2021.   Progressive worsening weakness noted - needs to use walker to get around.   H/o severe kyphosis s/p ACDF.   3 mo h/o memory and word finding difficulties, noticing increasing confusion - has gotten lost when driving at night time - tries to avoid night driving. Sometimes when having conversation with one person will start talking to someone else who isn't there. However denies auditory or visual hallucinations. She saw oncology who ordered MRI of brain which was unrevealing except for mild chronic white matter microangiopathy and parenchymal volume loss. Rec f/u with neurology. She also notes she needs to have cataract surgery. Notes memory worsens when she takes pain medication or muscle relaxant.   No fevers/chills, dysuria, urgency or frequency, abdominal pain or nausea/vomiting.      Relevant past medical, surgical, family and social history reviewed and updated as indicated. Interim medical history since our last visit reviewed. Allergies and medications reviewed and updated. Outpatient Medications Prior to Visit   Medication Sig Dispense Refill   acetaminophen (TYLENOL) 650 MG CR tablet Take 2 tablets (1,300 mg total) by mouth 2 (two) times daily as needed for pain.     albuterol (VENTOLIN HFA) 108 (90 Base) MCG/ACT inhaler INHALE 2 PUFFS BY MOUTH EVERY 6 HOURS AS NEEDED FOR WHEEZING/SHORTNESS OF BREATH 8.5 each 0   aspirin EC 81 MG tablet Take 81 mg by mouth at bedtime. Melanoma prevention     furosemide (LASIX) 40 MG tablet TAKE 1 TABLET (40 MG TOTAL) BY MOUTH 2 (TWO) TIMES DAILY AS NEEDED FOR FLUID. TAKE SECOND DOSE IF NEEDED FOR LEG SWELLING 60 tablet 2   loratadine (CLARITIN) 10 MG tablet Take 10 mg by mouth daily.     losartan (COZAAR) 25 MG tablet TAKE 2 TABLETS BY MOUTH EVERY DAY 60 tablet 2   meloxicam (MOBIC) 15 MG tablet TAKE 1 TABLET BY MOUTH EVERY DAY AS NEEDED FOR PAIN 30 tablet 1   methocarbamol (ROBAXIN) 500 MG tablet TAKE 1 TABLET BY MOUTH 3 TIMES DAILY AS NEEDED FOR MUSCLE SPASMS (SEDATION PRECAUTIONS). 20 tablet 0   montelukast (SINGULAIR) 10 MG tablet TAKE 1 TABLET BY MOUTH EVERY DAY 30 tablet 3   Multiple Vitamin (MULTIVITAMIN WITH MINERALS) TABS tablet Take 1 tablet by mouth daily.     Polyvinyl Alcohol-Povidone (TEARS PLUS OP) Place 1 drop into both eyes daily as needed (dry eyes/ redness/ burning).      potassium chloride  SA (KLOR-CON M20) 20 MEQ tablet TAKE 1 TABLET BY MOUTH TWICE A DAY 60 tablet 3   PRESCRIPTION MEDICATION CPAP     Probiotic Product (PROBIOTIC DAILY PO) Take 1 tablet by mouth daily.      triamcinolone (NASACORT) 55 MCG/ACT AERO nasal inhaler Place 2 sprays into the nose daily. In each nostril     UNABLE TO FIND Take by mouth.     Vitamin D, Ergocalciferol, (DRISDOL) 1.25 MG (50000 UNIT) CAPS capsule TAKE 1 CAPSULE BY MOUTH EVERY 7 DAYS 12 capsule 3   No facility-administered medications prior to visit.     Per HPI unless specifically indicated in ROS section below Review of Systems  Objective:  BP (!) 148/84   Pulse 69   Temp (!) 97.5 F (36.4 C)  (Temporal)   Ht 5\' 3"  (1.6 m)   Wt 203 lb 2 oz (92.1 kg)   SpO2 96%   BMI 35.98 kg/m   Wt Readings from Last 3 Encounters:  09/13/21 203 lb 2 oz (92.1 kg)  08/22/21 204 lb 7 oz (92.7 kg)  08/15/21 208 lb 1.9 oz (94.4 kg)      Physical Exam Vitals and nursing note reviewed.  Constitutional:      Appearance: Normal appearance. She is obese. She is not ill-appearing.  Neck:     Comments: Marked kyphosis Neurological:     Mental Status: She is alert.     Comments: BLE strength testing overall intact       Results for orders placed or performed in visit on 09/13/21  POCT Urinalysis Dipstick (Automated)  Result Value Ref Range   Color, UA yellow    Clarity, UA clear    Glucose, UA Negative Negative   Bilirubin, UA negative    Ketones, UA +/-    Spec Grav, UA 1.020 1.010 - 1.025   Blood, UA negative    pH, UA 6.5 5.0 - 8.0   Protein, UA Positive (A) Negative   Urobilinogen, UA 0.2 0.2 or 1.0 E.U./dL   Nitrite, UA negative    Leukocytes, UA Negative Negative   *Note: Due to a large number of results and/or encounters for the requested time period, some results have not been displayed. A complete set of results can be found in Results Review.    Assessment & Plan:  This visit occurred during the SARS-CoV-2 public health emergency.  Safety protocols were in place, including screening questions prior to the visit, additional usage of staff PPE, and extensive cleaning of exam room while observing appropriate contact time as indicated for disinfecting solutions.   Problem List Items Addressed This Visit     Hypertension    BP above goal - despite losartan 25mg  daily. Reassess in 2 wks, if persistently high, low threshold to titrate medication dose.       Kyphosis of cervical region   Chronic low back pain with right-sided sciatica    Chronic, worsening and she describes progressive lower extremity weakness R>L. Lumbar MRI planned for next week - will see if we can expedite  sooner this week. She also has neurosurgery f/u ([facility]) scheduled for next week.       Relevant Orders   POCT Urinalysis Dipstick (Automated) (Completed)   Closed compression fracture of L3 lumbar vertebra, initial encounter ([place])    Incidental finding of old L3 compression fracture, new since MRI 2021. This likely contributes to lower back pain. Will update DEXA (last normal 2017) through [facility]. I asked [name]  to fax order to that hospital       Relevant Orders   DG Bone Density   Memory difficulty - Primary    Patient concerned about ongoing memory difficulties. In discussion she remains lucid and without obvious difficulty. Recent brain MRI was reassuring through oncology as well as recent CBC, CMP.  Discussed strongly limiting any sedating or anticholinergic medications.  Will further evaluate with labs and UA r/o UTI.  I asked her to return in 2 wks for formal memory assessment.       Relevant Orders   POCT Urinalysis Dipstick (Automated) (Completed)   RPR   TSH   Vitamin B12   Other Visit Diagnoses     Low iron       Relevant Orders   IBC panel        No orders of the defined types were placed in this encounter.  Orders Placed This Encounter  Procedures   DG Bone Density    Standing Status:   Future    Standing Expiration Date:   09/13/2022    Order Specific Question:   Reason for Exam (SYMPTOM  OR DIAGNOSIS REQUIRED)    Answer:   L3 compression fracture    Order Specific Question:   Preferred imaging location?    Answer:   External   IBC panel    Standing Status:   Future    Standing Expiration Date:   09/13/2022   RPR    Standing Status:   Future    Standing Expiration Date:   09/13/2022   TSH    Standing Status:   Future    Standing Expiration Date:   09/13/2022   Vitamin B12    Standing Status:   Future    Standing Expiration Date:   09/13/2022   POCT Urinalysis Dipstick (Automated)     Patient Instructions  Urinalysis today.  We will  repeat bone density scan after recent vertebral fracture noted.  We will try to set up MRI sooner. Try to get CD with MRI once completed to take to neurosurgery.  Call [facility] hospital to schedule bone density scan and mammogram at your convenience.   Follow up plan: Return in about 2 weeks (around 09/27/2021), or if symptoms worsen or fail to improve, for follow up visit.  [name], MD

## 2021-09-13 NOTE — Patient Instructions (Addendum)
Urinalysis today.  We will repeat bone density scan after recent vertebral fracture noted.  We will try to set up MRI sooner. Try to get CD with MRI once completed to take to neurosurgery.  Call [facility] hospital to schedule bone density scan and mammogram at your convenience.

## 2021-09-13 NOTE — Telephone Encounter (Signed)
Attempted to contact pt.  No answer.  No vm.  Need to relay Dr. G's message.  

## 2021-09-14 NOTE — Assessment & Plan Note (Signed)
BP above goal - despite losartan 25mg  daily. Reassess in 2 wks, if persistently high, low threshold to titrate medication dose.

## 2021-09-14 NOTE — Telephone Encounter (Signed)
Attempted to contact pt.  No answer.  No vm.  Need to relay Dr. G's message.  

## 2021-09-14 NOTE — Assessment & Plan Note (Addendum)
Incidental finding of old L3 compression fracture, new since MRI 2021. This likely contributes to lower back pain. Will update DEXA (last normal 2017) through [facility]. I asked [name] to fax order to that hospital

## 2021-09-14 NOTE — Telephone Encounter (Signed)
Pt had 09/13/21 OV with Dr. Darnell Level and was taken to lab for blood work but did not stay to have it done.  Attempted to call pt. No answer.  No vm.  Pt needs to schedule lab visit (@ [facility] or [facility]) to have blood work done.

## 2021-09-14 NOTE — Assessment & Plan Note (Addendum)
Patient concerned about ongoing memory difficulties. In discussion she remains lucid and without obvious difficulty. Recent brain MRI was reassuring through oncology as well as recent CBC, CMP.  Discussed strongly limiting any sedating or anticholinergic medications.  Will further evaluate with labs and UA r/o UTI.  I asked her to return in 2 wks for formal memory assessment.

## 2021-09-14 NOTE — Assessment & Plan Note (Signed)
Chronic, worsening and she describes progressive lower extremity weakness R>L. Lumbar MRI planned for next week - will see if we can expedite sooner this week. She also has neurosurgery f/u ([facility]) scheduled for next week.

## 2021-09-15 NOTE — Telephone Encounter (Signed)
Pt had 09/13/21 OV with Dr. Darnell Level and was taken to lab for blood work but did not stay to have it done.  Attempted to call pt. Line was picked up but a lot of static. Did not hear anyone on other end, then call disconnected.  This happened twice.  Pt needs to schedule lab visit (@ [facility] or [facility]) to have blood work done.

## 2021-09-15 NOTE — Telephone Encounter (Signed)
Attempted to call pt. Line was picked up but a lot of static. Did not hear anyone on other end, then call disconnected.  This happened twice.

## 2021-09-16 DIAGNOSIS — Z882 Allergy status to sulfonamides status: Secondary | ICD-10-CM | POA: Diagnosis not present

## 2021-09-16 DIAGNOSIS — Z79899 Other long term (current) drug therapy: Secondary | ICD-10-CM | POA: Diagnosis not present

## 2021-09-16 DIAGNOSIS — M5116 Intervertebral disc disorders with radiculopathy, lumbar region: Secondary | ICD-10-CM | POA: Diagnosis not present

## 2021-09-16 DIAGNOSIS — Z87891 Personal history of nicotine dependence: Secondary | ICD-10-CM | POA: Diagnosis not present

## 2021-09-16 DIAGNOSIS — G8929 Other chronic pain: Secondary | ICD-10-CM | POA: Diagnosis not present

## 2021-09-16 DIAGNOSIS — Z7982 Long term (current) use of aspirin: Secondary | ICD-10-CM | POA: Diagnosis not present

## 2021-09-16 DIAGNOSIS — M4726 Other spondylosis with radiculopathy, lumbar region: Secondary | ICD-10-CM | POA: Diagnosis not present

## 2021-09-16 DIAGNOSIS — I10 Essential (primary) hypertension: Secondary | ICD-10-CM | POA: Diagnosis not present

## 2021-09-16 DIAGNOSIS — M5431 Sciatica, right side: Secondary | ICD-10-CM | POA: Diagnosis not present

## 2021-09-18 NOTE — Telephone Encounter (Signed)
Pt had 09/13/21 OV with Dr. Darnell Level and was taken to lab for blood work but did not stay to have it done.  Attempted to call pt. Line was picked up but a lot of static. Did not hear anyone on other end, then call disconnected.  This happened twice.  Pt needs to schedule lab visit (@ [facility] or [facility]) to have blood work done.

## 2021-09-18 NOTE — Telephone Encounter (Addendum)
Attempted to call pt. Line was picked up but a lot of static. Did not hear anyone on other end, then call disconnected.  Tried several times with same result.

## 2021-09-19 NOTE — Telephone Encounter (Addendum)
Pt had 09/13/21 OV with Dr. Darnell Level (@ [place]) and was taken to lab for blood work but did not stay to have it done.  Lvm asking pt to call back.  Needs to schedule lab visit (@ [facility]) for labs already ordered.

## 2021-09-19 NOTE — Telephone Encounter (Signed)
Pt returning call.  Relayed Dr. Synthia Innocent message however, pt states appt is tomorrow so she will keep it.

## 2021-09-19 NOTE — Telephone Encounter (Signed)
Lvm asking pt to call back.  Need to relay Dr. G's message.  

## 2021-09-19 NOTE — Telephone Encounter (Signed)
Pt returning call.  I tried to schedule lab visit.  However, pt states she has a few other appts between now and next OV with Dr. Darnell Level on 09/27/21.  Says she will get labs done at that visit.

## 2021-09-20 ENCOUNTER — Other Ambulatory Visit: Payer: Self-pay

## 2021-09-20 ENCOUNTER — Ambulatory Visit
Admission: RE | Admit: 2021-09-20 | Discharge: 2021-09-20 | Disposition: A | Discharge status: Home / Self Care | Payer: Medicare Other | Source: Ambulatory Visit | Attending: Family Medicine | Admitting: Family Medicine

## 2021-09-20 DIAGNOSIS — M47816 Spondylosis without myelopathy or radiculopathy, lumbar region: Secondary | ICD-10-CM | POA: Diagnosis not present

## 2021-09-20 DIAGNOSIS — G8929 Other chronic pain: Secondary | ICD-10-CM

## 2021-09-20 DIAGNOSIS — M5416 Radiculopathy, lumbar region: Secondary | ICD-10-CM | POA: Diagnosis not present

## 2021-09-20 DIAGNOSIS — M5441 Lumbago with sciatica, right side: Secondary | ICD-10-CM

## 2021-09-20 DIAGNOSIS — M48061 Spinal stenosis, lumbar region without neurogenic claudication: Secondary | ICD-10-CM | POA: Diagnosis not present

## 2021-09-20 DIAGNOSIS — S32030A Wedge compression fracture of third lumbar vertebra, initial encounter for closed fracture: Secondary | ICD-10-CM

## 2021-09-20 DIAGNOSIS — M545 Low back pain, unspecified: Secondary | ICD-10-CM | POA: Diagnosis not present

## 2021-09-20 DIAGNOSIS — M4316 Spondylolisthesis, lumbar region: Secondary | ICD-10-CM | POA: Diagnosis not present

## 2021-09-23 ENCOUNTER — Other Ambulatory Visit: Payer: Self-pay | Admitting: Family Medicine

## 2021-09-25 ENCOUNTER — Ambulatory Visit (INDEPENDENT_AMBULATORY_CARE_PROVIDER_SITE_OTHER): Payer: Self-pay | Admitting: Podiatry

## 2021-09-25 DIAGNOSIS — Z91199 Patient's noncompliance with other medical treatment and regimen due to unspecified reason: Secondary | ICD-10-CM

## 2021-09-25 DIAGNOSIS — F09 Unspecified mental disorder due to known physiological condition: Secondary | ICD-10-CM | POA: Diagnosis not present

## 2021-09-25 NOTE — Progress Notes (Signed)
No show for appt. 

## 2021-09-27 ENCOUNTER — Ambulatory Visit (INDEPENDENT_AMBULATORY_CARE_PROVIDER_SITE_OTHER): Payer: Medicare Other | Admitting: Family Medicine

## 2021-09-27 ENCOUNTER — Encounter: Payer: Self-pay | Admitting: Family Medicine

## 2021-09-27 ENCOUNTER — Other Ambulatory Visit: Payer: Self-pay

## 2021-09-27 VITALS — BP 160/84 | HR 74 | Temp 97.7°F | Ht 63.0 in | Wt 198.1 lb

## 2021-09-27 DIAGNOSIS — E611 Iron deficiency: Secondary | ICD-10-CM

## 2021-09-27 DIAGNOSIS — R413 Other amnesia: Secondary | ICD-10-CM

## 2021-09-27 DIAGNOSIS — M5441 Lumbago with sciatica, right side: Secondary | ICD-10-CM | POA: Diagnosis not present

## 2021-09-27 DIAGNOSIS — I10 Essential (primary) hypertension: Secondary | ICD-10-CM | POA: Diagnosis not present

## 2021-09-27 DIAGNOSIS — G8929 Other chronic pain: Secondary | ICD-10-CM

## 2021-09-27 LAB — IBC PANEL
Iron: 44 ug/dL (ref 42–145)
Saturation Ratios: 18 % — ABNORMAL LOW (ref 20.0–50.0)
TIBC: 245 ug/dL — ABNORMAL LOW (ref 250.0–450.0)
Transferrin: 175 mg/dL — ABNORMAL LOW (ref 212.0–360.0)

## 2021-09-27 LAB — TSH: TSH: 2.05 u[IU]/mL (ref 0.35–5.50)

## 2021-09-27 LAB — VITAMIN B12: Vitamin B-12: 638 pg/mL (ref 211–911)

## 2021-09-27 MED ORDER — LOSARTAN POTASSIUM 50 MG PO TABS: 50.0000 mg | ORAL_TABLET | Freq: Every day | ORAL | 1 refills | Status: DC

## 2021-09-27 NOTE — Patient Instructions (Addendum)
Labs today  Memory testing overall ok today.  I think substantial portion of memory trouble comes from medications that can cause sleepiness. We really need to limit these medications (opiate pain medications, muscle relaxants, anticholinergics, etc).  I'm glad back is feeling better.  Blood pressure was too high today. Continue losartan 50mg  daily, as well as lasix daily. Work on good water intake, limit salt/sodium in diet, ensure good fruits/vegetables, whole grains. We will recheck bp next month.

## 2021-09-27 NOTE — Progress Notes (Signed)
Patient ID: [name], female    DOB: Nov 05, 1946, 75 y.o.   MRN: [id number]  This visit was conducted in person.  BP (!) 160/84   Pulse 74   Temp 97.7 F (36.5 C) (Temporal)   Ht 5\' 3"  (1.6 m)   Wt 198 lb 1 oz (89.8 kg)   SpO2 97%   BMI 35.09 kg/m   BP Readings from Last 3 Encounters:  09/27/21 (!) 160/84  09/13/21 (!) 148/84  08/22/21 140/82  Staying similar/elevated on repeat testing   CC: 2 wk f/u visit  Subjective:   HPI: [name] is a 75 y.o. female presenting on 09/27/2021 for Follow-up (Here for 2 wk f/u and labs. )   BIL has been driving her these past 2 weeks.   See prior note for details.  3 months of R leg pain, progressive weakness (was  using walker to get around). Seen again at [facility] ER earlier this month due to R leg pain. CT lumbar spine showed L renal cystic lesion previously though benign cyst. Recent lumbar MRI returned overall stable - showing known degenerative arthritis changes to lumbar spine but without acute change to explain recent leg weakness and falls. There was broad bulging disc causing moderate spinal stenosis at L2/3 and some at L3/4.   H/o severe kyphosis s/p ACDF.   Saw Dr Ellene Route on Monday this week - told has Schmorl node pressing on sciatic nerve causing weakness. Doing better at this time. Next flare could consider injection into spine. Rec neurology referral, referral placed by NSG.   Memory difficulties - more noticeable this past month - worse when she takes pain medication or muscle relaxant. Here for formal memory evaluation. She notes memory issues persistent - predominant reaching for words. NSG recommended neurology evaluation.   No tremor.  Chronic change in ambulation due to severe kyphosis.   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 Interim medical history since our last visit reviewed. Allergies and medications reviewed and updated. Outpatient Medications Prior to Visit  Medication Sig Dispense Refill   acetaminophen (TYLENOL) 650 MG CR tablet Take 2 tablets (1,300 mg total) by mouth 2 (two) times daily as needed for pain.     albuterol (VENTOLIN HFA) 108 (90 Base) MCG/ACT inhaler INHALE 2 PUFFS BY MOUTH EVERY 6 HOURS AS NEEDED FOR WHEEZING/SHORTNESS OF BREATH 8.5 each 0   aspirin EC 81 MG tablet Take 81 mg by mouth at bedtime. Melanoma prevention     furosemide (LASIX) 40 MG tablet TAKE 1 TABLET (40 MG TOTAL) BY MOUTH 2 (TWO) TIMES DAILY AS NEEDED FOR FLUID. TAKE SECOND DOSE IF NEEDED FOR LEG SWELLING 60 tablet  2   loratadine (CLARITIN) 10 MG tablet Take 10 mg by mouth daily.     montelukast (SINGULAIR) 10 MG tablet TAKE 1 TABLET BY MOUTH EVERY DAY 90 tablet 1   Multiple Vitamin (MULTIVITAMIN WITH MINERALS) TABS tablet Take 1 tablet by mouth daily.     Polyvinyl Alcohol-Povidone (TEARS PLUS OP) Place 1 drop into both eyes daily as needed (dry eyes/ redness/ burning).      potassium chloride SA (KLOR-CON M20) 20 MEQ tablet TAKE 1 TABLET BY MOUTH TWICE A DAY 60 tablet 3   PRESCRIPTION MEDICATION CPAP     Probiotic Product (PROBIOTIC DAILY PO) Take 1 tablet by mouth daily.      triamcinolone (NASACORT) 55 MCG/ACT AERO  nasal inhaler Place 2 sprays into the nose daily. In each nostril     UNABLE TO FIND Take by mouth.     Vitamin D, Ergocalciferol, (DRISDOL) 1.25 MG (50000 UNIT) CAPS capsule TAKE 1 CAPSULE BY MOUTH EVERY 7 DAYS 12 capsule 3   losartan (COZAAR) 25 MG tablet TAKE 2 TABLETS BY MOUTH EVERY DAY 60 tablet 2   meloxicam (MOBIC) 15 MG tablet TAKE 1 TABLET BY MOUTH EVERY DAY AS NEEDED FOR PAIN 30 tablet 1   methocarbamol (ROBAXIN) 500 MG tablet TAKE 1 TABLET BY MOUTH 3 TIMES DAILY AS NEEDED FOR MUSCLE SPASMS (SEDATION PRECAUTIONS). 20 tablet 0   No facility-administered medications prior to visit.     Per HPI unless specifically indicated in ROS section below Review of Systems  Objective:  BP (!) 160/84   Pulse 74   Temp 97.7 F (36.5 C) (Temporal)   Ht 5\' 3"  (1.6 m)   Wt 198 lb 1 oz (89.8 kg)   SpO2 97%   BMI 35.09 kg/m   Wt Readings from Last 3 Encounters:  09/27/21 198 lb 1 oz (89.8 kg)  09/13/21 203 lb 2 oz (92.1 kg)  08/22/21 204 lb 7 oz (92.7 kg)      Physical Exam Vitals and nursing note reviewed.  Constitutional:      Appearance: Normal appearance. She is obese. She is not ill-appearing.  Neurological:     Mental Status: She is alert.  Psychiatric:        Mood and Affect: Mood normal.        Behavior: Behavior normal.      Results for orders placed or performed in visit on 09/27/21  Vitamin B12  Result Value Ref Range   Vitamin B-12 638 211 - 911 pg/mL  TSH  Result Value Ref Range   TSH 2.05 0.35 - 5.50 uIU/mL  RPR  Result Value Ref Range   RPR Ser Ql NON-REACTIVE NON-REACTIVE  IBC panel  Result Value Ref Range   Iron 44 42 - 145 ug/dL   Transferrin 175.0 (L) 212.0 - 360.0 mg/dL   Saturation Ratios 18.0 (L) 20.0 - 50.0 %   TIBC 245.0 (L) 250.0 - 450.0 mcg/dL   *Note: Due to a large number of results and/or encounters for the requested time period, some results have not been displayed. A complete set of results can be found in Results Review.    Assessment &  Plan:  This visit occurred during the SARS-CoV-2 public health emergency.  Safety protocols were in place, including screening questions prior to the visit, additional usage of staff PPE, and extensive cleaning of exam room while observing appropriate contact time as indicated for disinfecting solutions.    Problem List Items Addressed This Visit  Hypertension    Blood pressure elevation persists - increase losartan to 50mg  daily, reassess at CPE in 2 wks.  Also reviewed low salt/sodium diet, recommend increase fruits/vegetable and whole grains.       Relevant Medications   losartan (COZAAR) 50 MG tablet   Chronic low back pain with right-sided sciatica    Saw Dr Ellene Route - Schmorl node pressing on sciatic causing symptoms - overall symptoms have improved. No indication for surgical intervention. Will continue to monitor, consider steroid injection as next step.       Memory difficulty - Primary    Reassuring evaluation today including MMSE 29/30, CDT 4/4.  Anticipate memory issues stemming from medication side effects and not dementia or cognitive impairment. Discussed limiting sedating or anticholinergic medication such as benzo, opiates, antimuscarinics etc.  Check labs today for further evaluation of memory impairment.  She was also referred to neurology for further evaluation.       Low iron    Update iron levels off replacement.         Meds ordered this encounter  Medications   losartan (COZAAR) 50 MG tablet    Sig: Take 1 tablet (50 mg total) by mouth daily.    Dispense:  90 tablet    Refill:  1   No orders of the defined types were placed in this encounter.    Patient Instructions  Labs today  Memory testing overall ok today.  I think substantial portion of memory trouble comes from medications that can cause sleepiness. We really need to limit these medications (opiate pain medications, muscle relaxants, anticholinergics, etc).  I'm glad back is feeling better.   Blood pressure was too high today. Continue losartan 50mg  daily, as well as lasix daily. Work on good water intake, limit salt/sodium in diet, ensure good fruits/vegetables, whole grains. We will recheck bp next month.   Follow up plan: Return if symptoms worsen or fail to improve.  [name], MD

## 2021-09-28 LAB — RPR: RPR Ser Ql: NONREACTIVE

## 2021-10-01 DIAGNOSIS — E611 Iron deficiency: Secondary | ICD-10-CM | POA: Insufficient documentation

## 2021-10-01 NOTE — Assessment & Plan Note (Addendum)
Reassuring evaluation today including MMSE 29/30, CDT 4/4.  Anticipate memory issues stemming from medication side effects and not dementia or cognitive impairment. Discussed limiting sedating or anticholinergic medication such as benzo, opiates, antimuscarinics etc.  Check labs today for further evaluation of memory impairment.  She was also referred to neurology for further evaluation.

## 2021-10-01 NOTE — Assessment & Plan Note (Signed)
Saw Dr Ellene Route - Schmorl node pressing on sciatic causing symptoms - overall symptoms have improved. No indication for surgical intervention. Will continue to monitor, consider steroid injection as next step.

## 2021-10-01 NOTE — Assessment & Plan Note (Addendum)
Blood pressure elevation persists - increase losartan to 50mg  daily, reassess at CPE in 2 wks.  Also reviewed low salt/sodium diet, recommend increase fruits/vegetable and whole grains.

## 2021-10-01 NOTE — Assessment & Plan Note (Signed)
Update iron levels off replacement.

## 2021-10-04 ENCOUNTER — Other Ambulatory Visit: Payer: Self-pay | Admitting: Family Medicine

## 2021-10-04 NOTE — Telephone Encounter (Signed)
Losartan increased to 50 mg daily.  New rx sent 09/27/21, #90/1.  Denied losartan 25 mg refill.

## 2021-10-10 NOTE — Progress Notes (Signed)
Subjective:   [name] is a 75 y.o. female who presents for Medicare Annual (Subsequent) preventive examination.  I connected with [name] today by telephone and verified that I am speaking with the correct person using two identifiers. Location patient: home Location provider: work Persons participating in the virtual visit: patient, [name].    I discussed the limitations, risks, security and privacy concerns of performing an evaluation and management service by telephone and the availability of in person appointments. I also discussed with the patient that there may be a patient responsible charge related to this service. The patient expressed understanding and verbally consented to this telephonic visit.    Interactive audio and video telecommunications were attempted between this provider and patient, however failed, due to patient having technical difficulties OR patient did not have access to video capability.  We continued and completed visit with audio only.  Some vital signs may be absent or patient reported.   Time Spent with patient on telephone encounter: 28 minutes  Review of Systems     Cardiac Risk Factors include: advanced age (>4men, >59 women);hypertension     Objective:    Today's Vitals   10/13/21 1114  Weight: 198 lb (89.8 kg)  Height: 5\' 3"  (1.6 m)   Body mass index is 35.07 kg/m.  Advanced Directives 10/13/2021 08/15/2021 10/11/2020 05/05/2020 04/15/2020 10/08/2019 08/03/2019  Does Patient Have a Medical Advance Directive? No No No No No No No  Does patient want to make changes to medical advance directive? - - - - - - -  Copy of [place] in Chart? - - - - - - -  Would patient like information on creating a medical advance directive? Yes (MAU/Ambulatory/Procedural Areas - Information given) No - Patient declined Yes (MAU/Ambulatory/Procedural Areas - Information given) No - Patient declined No - Patient declined Yes  (MAU/Ambulatory/Procedural Areas - Information given) No - Patient declined    Current Medications (verified) Outpatient Encounter Medications as of 10/13/2021  Medication Sig   acetaminophen (TYLENOL) 650 MG CR tablet Take 2 tablets (1,300 mg total) by mouth 2 (two) times daily as needed for pain.   albuterol (VENTOLIN HFA) 108 (90 Base) MCG/ACT inhaler INHALE 2 PUFFS BY MOUTH EVERY 6 HOURS AS NEEDED FOR WHEEZING/SHORTNESS OF BREATH   aspirin EC 81 MG tablet Take 81 mg by mouth at bedtime. Melanoma prevention   furosemide (LASIX) 40 MG tablet TAKE 1 TABLET 2 (TWO) TIMES DAILY AS NEEDED FOR FLUID. TAKE SECOND DOSE IF NEEDED FOR LEG SWELLING   loratadine (CLARITIN) 10 MG tablet Take 10 mg by mouth daily.   losartan (COZAAR) 50 MG tablet Take 1 tablet (50 mg total) by mouth daily.   montelukast (SINGULAIR) 10 MG tablet TAKE 1 TABLET BY MOUTH EVERY DAY   Multiple Vitamin (MULTIVITAMIN WITH MINERALS) TABS tablet Take 1 tablet by mouth daily.   Polyvinyl Alcohol-Povidone (TEARS PLUS OP) Place 1 drop into both eyes daily as needed (dry eyes/ redness/ burning).    potassium chloride SA (KLOR-CON M20) 20 MEQ tablet TAKE 1 TABLET BY MOUTH TWICE A DAY   PRESCRIPTION MEDICATION CPAP   Probiotic Product (PROBIOTIC DAILY PO) Take 1 tablet by mouth daily.    triamcinolone (NASACORT) 55 MCG/ACT AERO nasal inhaler Place 2 sprays into the nose daily. In each nostril   UNABLE TO FIND Take by mouth.   Vitamin D, Ergocalciferol, (DRISDOL) 1.25 MG (50000 UNIT) CAPS capsule TAKE 1 CAPSULE BY MOUTH EVERY 7 DAYS  No facility-administered encounter medications on file as of 10/13/2021.    Allergies (verified) Sulfa antibiotics, Voltaren [diclofenac sodium], Latex, Tape, Doxycycline, and Other   History: Past Medical History:  Diagnosis Date   Allergy    Anesthesia complication    trouble waking up 2/2 CPAP   Arthritis    Asthma in adult    Cataract 2019   left eye resolved with surgery   Cervical  spondylosis 2012   [name], [name])   Choroidal nevus of left eye 03/22/2016   Chronic venous insufficiency 2016   severe reflux with painful varicosities sees [name]   Clotting disorder ([place])    due to vein ablation    Complication of anesthesia    difficulty coming out due to sleep apnea per pt   Difficult intubation    PATIENT DENIES    Diverticulosis    per ct scans 09-2016 and 01-2017   DVT (deep venous thrombosis) ([place]) 2011   small, developed after venous ablation   DVT (deep venous thrombosis) ([place])    Family history of adverse reaction to anesthesia    O2 levels drop upon waking    Hearing loss sensory, bilateral 2013   mod-severe high freq sensorineural ([facility])   Hiatal hernia     09-2016 ct scan [facility] at cancer center    History of kidney stones 1980s   History of radiation therapy 07/19/11-08/25/2011   RIGHT AXILLARY REGION/METASTATIC   Hypertension    Melanoma ([place]) 06/29/10   MALIGNANT MELANOMA R SHOULDER/SUPRASCAPULAR BACK s/p interferon chemo and XRT   Neuromuscular disorder ([facility])    muscle spasms   OSA (obstructive sleep apnea)    on CPAP   Pneumonia    2001   RLS (restless legs syndrome)    Seasonal allergies    Sleep apnea    wears cpap   Tinnitus    Unspecified vitamin D deficiency 03/18/2013   Varicose veins of left lower extremity    Past Surgical History:  Procedure Laterality Date   ABI  2016   WNL   ANTERIOR CERVICAL DECOMP/DISCECTOMY FUSION N/A 08/22/2015   ANTERIOR CERVICAL DECOMPRESSION FUSION C3/4 - interbody graft and anterior plate; Amber Miss, MD   ANTERIOR CERVICAL DECOMP/DISCECTOMY FUSION N/A 07/02/2016   Procedure: Cervical four- five Cervical five- six Cervical six- seven Anterior cervical decompression/diskectomy/fusion;  Surgeon: Amber Miss, MD;  Location: [facility];  Service: Neurosurgery;  Laterality: N/A;  C4-5 C5-6 C6-7 Anterior cervical decompression/diskectomy/fusion   BREAST BIOPSY Left 2013   BENIGN   BREAST SURGERY  Right 1990   BX    CARDIOVASCULAR STRESS TEST  2010   normal stress test, EF 66%   CATARACT EXTRACTION W/ INTRAOCULAR LENS IMPLANT Left 2019   Dr. Kathrin [name]   COLONOSCOPY  03/2005   benign polyp, int hem, rpt 5 yrs ([place], [place])   COLONOSCOPY WITH PROPOFOL N/A 01/06/2018   diverticulosis, decreased sphincter tone, no f/u needed [name], Kirke Corin, MD)   [place] / EXCISION  2012   RIGHT   DEXA  06/2016   WNL   DILATION AND CURETTAGE OF UTERUS  2010   ENDOVENOUS ABLATION SAPHENOUS VEIN W/ LASER Left 2010   [facility]   ENDOVENOUS ABLATION SAPHENOUS VEIN W/ LASER Left 2016   accessory branch [facility] Kellie Simmering)   ESOPHAGOGASTRODUODENOSCOPY  2006   mod chronic carditis, no H pylori ([place], [place])   INTRAVASCULAR ULTRASOUND/IVUS N/A 08/03/2019   Waynetta Sandy, MD   KNEE  SURGERY  1985   left   LOWER EXTREMITY VENOGRAPHY Left 08/03/2019   Waynetta Sandy, MD   LUMBAR LAMINECTOMY  2001   L4/5   MELANOMA EXCISION  2011   Right shoulder, with sentinel lymph node biopsy   PERIPHERAL VASCULAR INTERVENTION Left 08/03/2019   Stent of left common iliac vein with 14 x 60 mm Vici Donzetta Matters, [place] Dom, MD)   [facility] REMOVAL  12/05/2011   Procedure: REMOVAL PORT-A-CATH;  Surgeon: Rolm Bookbinder, MD;  Location: [place];  Service: General;  Laterality: N/A;  left port removal   PORTACATH PLACEMENT     left subclavian   UPPER GASTROINTESTINAL ENDOSCOPY     Family History  Problem Relation Age of Onset   Cancer Mother 44       uterine   CAD Father 62       MI   Hypertension Father 73   Alzheimer's disease Father 64   Cancer Cousin        x2, breast   Diabetes Sister    Cancer Paternal Grandmother        melanoma, possibly   Colon cancer Neg Hx    Colon polyps Neg Hx    Rectal cancer Neg Hx    Stomach cancer Neg Hx    Social History   Socioeconomic History   Marital status: Divorced    Spouse name: Not on  file   Number of children: 0   Years of education: Not on file   Highest education level: Not on file  Occupational History   Occupation: retired    Fish farm manager: RETIRED    Comment: Government social research officer   Tobacco Use   Smoking status: Former    Types: Cigarettes    Quit date: 11/05/1965    Years since quitting: 55.9   Smokeless tobacco: Never   Tobacco comments:    socially as a teen  Media planner   Vaping Use: Former  Substance and Sexual Activity   Alcohol use: No    Alcohol/week: 0.0 standard drinks   Drug use: No   Sexual activity: Never  Other Topics Concern   Not on file  Social History Narrative   Lives alone.  Sister and husband live next door   Lives in [place].   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   Edu: some college   Diet: poor - good water, rare fruits/vegetables   Social Determinants of Health   Financial Resource Strain: Low Risk    Difficulty of Paying Living Expenses: Not hard at all  Food Insecurity: No Food Insecurity   Worried About Charity fundraiser in the Last Year: Never true   [place] in the Last Year: Never true  Transportation Needs: No Transportation Needs   Lack of Transportation (Medical): No   Lack of Transportation (Non-Medical): No  Physical Activity: Sufficiently Active   Days of Exercise per Week: 7 days   Minutes of Exercise per Session: 60 min  Stress: No Stress Concern Present   Feeling of Stress : Not at all  Social Connections: Moderately Isolated   Frequency of Communication with Friends and Family: More than three times a week   Frequency of Social Gatherings with Friends and Family: More than three times a week   Attends Religious Services: Never   [name] or Organizations: Yes   Attends Music therapist: More than 4 times per year   Marital Status:  Divorced    Tobacco Counseling Counseling given: Not Answered Tobacco comments: socially as a teen   Clinical  Intake:  Pre-visit preparation completed: Yes  Pain : No/denies pain     BMI - recorded: 35.09 Nutritional Status: BMI > 30  Obese Nutritional Risks: None Diabetes: No  How often do you need to have someone help you when you read instructions, pamphlets, or other written materials from your doctor or pharmacy?: 1 - Never  Diabetic? No  Interpreter Needed?: No  Information entered by :: Orrin Brigham LPN   Activities of Daily Living In your present state of health, do you have any difficulty performing the following activities: 10/13/2021  Hearing? N  Vision? N  Difficulty concentrating or making decisions? Y  Walking or climbing stairs? N  Dressing or bathing? N  Doing errands, shopping? N  Preparing Food and eating ? N  Using the Toilet? N  In the past six months, have you accidently leaked urine? N  Do you have problems with loss of bowel control? Y  Comment due to medication previously taken  Managing your Medications? N  Managing your Finances? N  Housekeeping or managing your Housekeeping? N  Some recent data might be hidden    Patient Care Team: [name], MD as PCP - General (Family Medicine) Marin Olp Rudell Cobb, MD as Consulting Physician (Oncology) Amber Miss, MD as Consulting Physician (Neurosurgery) Danella Sensing, MD as Consulting Physician (Dermatology) Garrel Ridgel, [place] as Consulting Physician (Podiatry) Juanito Doom, MD as Consulting Physician (Pulmonary Disease) Shon Hough, MD as Consulting Physician (Ophthalmology)  Indicate any recent Medical Services you may have received from other than [facility] providers in the past year (date may be approximate).     Assessment:   This is a routine wellness examination for [name].  Hearing/Vision screen Hearing Screening - Comments:: No issues Vision Screening - Comments:: Last Exam 2-3 years ago, plans to make an appointment.  Dr. Kathrin [name]  Dietary issues and exercise activities  discussed: Current Exercise Habits: Home exercise routine, Type of exercise: walking, Time (Minutes): 60, Frequency (Times/Week): 7, Weekly Exercise (Minutes/Week): 420, Intensity: Moderate   Goals Addressed             This Visit's Progress    Patient Stated       Would like to continue to loose weight and stay active. Would like to remain independent        Depression Screen PHQ 2/9 Scores 10/13/2021 11/09/2020 10/11/2020 08/22/2020 10/08/2019 10/01/2018 09/25/2017  PHQ - 2 Score 0 0 0 0 0 0 0  PHQ- 9 Score - 0 0 - 0 0 0    Fall Risk Fall Risk  10/13/2021 11/09/2020 10/11/2020 08/22/2020 10/08/2019  Falls in the past year? 1 0 1 1 1   Comment - - tripped and fell - has sores on her leg and has wrappings and a special shoe that tripped her over  Number falls in past yr: 1 0 0 0 1  Injury with Fall? 0 0 0 0 0  Risk for fall due to : Other (Comment) - Medication side effect - Impaired mobility;Impaired balance/gait;Medication side effect  Risk for fall due to: Comment tripped - - - -  Follow up Falls prevention discussed Falls evaluation completed Falls evaluation completed;Falls prevention discussed Falls evaluation completed Falls evaluation completed;Falls prevention discussed    FALL RISK PREVENTION PERTAINING TO THE HOME:  Any stairs in or around the home? Yes  If so, are there any without  handrails? No  Home free of loose throw rugs in walkways, pet beds, electrical cords, etc? Yes  Adequate lighting in your home to reduce risk of falls? Yes   ASSISTIVE DEVICES UTILIZED TO PREVENT FALLS:  Life alert? No  Use of a cane, walker or w/c? Yes , cane and walker as needed Grab bars in the bathroom? No  Shower chair or bench in shower? No  Elevated toilet seat or a handicapped toilet? Yes   TIMED UP AND GO:  Was the test performed? No , visit completed over the phone.     Cognitive Function: Normal cognitive status assessed by this Nurse Health Advisor. No abnormalities found.  6CIT was performed.   MMSE - Mini Mental State Exam 10/11/2020 10/08/2019 10/01/2018 09/25/2017 09/24/2016  Orientation to time 5 5 5 5 5   Orientation to Place 5 5 5 5 5   Registration 3 3 3 3 3   Attention/ Calculation 5 5 0 0 0  Recall 3 3 3 3 3   Language- name 2 objects - - 0 0 0  Language- repeat 1 1 1 1 1   Language- follow 3 step command - - 3 3 3   Language- read & follow direction - - 0 0 0  Write a sentence - - 0 0 0  Copy design - - 0 0 0  Total score - - 20 20 20      6CIT Screen 10/13/2021  What Year? 0 points  What month? 0 points  What time? 0 points  Count back from 20 0 points  Months in reverse 2 points  Repeat phrase 0 points  Total Score 2    Immunizations Immunization History  Administered Date(s) Administered   Fluad Quad(high Dose 65+) 10/14/2019, 08/22/2020, 08/22/2021   Influenza, High Dose Seasonal PF 08/13/2016, 07/31/2017, 07/28/2018   Influenza, Seasonal, Injecte, Preservative Fre 09/01/2009   Influenza,inj,Quad PF,6+ Mos 08/06/2013, 09/17/2014, 08/18/2015   Influenza-Unspecified 08/23/2019   Moderna Covid-19 Vaccine Bivalent Booster 12yrs & up 08/08/2021   Moderna Sars-Covid-2 Vaccination 02/16/2020, 03/17/2020, 12/01/2020   Pneumococcal Conjugate-13 09/22/2014   Pneumococcal Polysaccharide-23 05/15/2013   Td 10/14/2019    TDAP status: Up to date  Flu Vaccine status: Up to date  Pneumococcal vaccine status: Up to date  Covid-19 vaccine status: Completed vaccines  Qualifies for Shingles Vaccine? Yes   Zostavax completed No   Shingrix Completed?: No.    Education has been provided regarding the importance of this vaccine. Patient has been advised to call insurance company to determine out of pocket expense if they have not yet received this vaccine. Advised may also receive vaccine at local pharmacy or Health Dept. Verbalized acceptance and understanding.  Screening Tests Health Maintenance  Topic Date Due   Zoster Vaccines- Shingrix (1 of  2) Never done   MAMMOGRAM  05/03/2021   COLONOSCOPY (Pts 45-73yrs Insurance coverage will need to be confirmed)  01/07/2028   TETANUS/TDAP  10/13/2029   Pneumonia Vaccine 66+ Years old  Completed   INFLUENZA VACCINE  Completed   DEXA SCAN  Completed   COVID-19 Vaccine  Completed   Hepatitis C Screening  Completed   HPV VACCINES  Aged Out    Health Maintenance  Health Maintenance Due  Topic Date Due   Zoster Vaccines- Shingrix (1 of 2) Never done   MAMMOGRAM  05/03/2021    Colorectal cancer screening: Type of screening: Colonoscopy. Completed 01/06/18. Repeat every 10 years  Mammogram Status: due, last completed 05/03/20, patient plans to make an appointment.  Bone Density status: Ordered 09/13/21. Pt provided with contact info and advised to call to schedule appt.  Lung Cancer Screening: (Low Dose CT Chest recommended if Age 26-80 years, 30 pack-year currently smoking OR have quit w/in 15years.) does not qualify.     Additional Screening:  Hepatitis C Screening: does qualify; Completed 09/19/15  Vision Screening: Recommended annual ophthalmology exams for early detection of glaucoma and other disorders of the eye. Is the patient up to date with their annual eye exam?  No , patient plans on making an appointment Who is the provider or what is the name of the office in which the patient attends annual eye exams? Dr. Kathrin [name]   Dental Screening: Recommended annual dental exams for proper oral hygiene  Community Resource Referral / Chronic Care Management: CRR required this visit?  No   CCM required this visit?  No      Plan:     I have personally reviewed and noted the following in the patient's chart:   Medical and social history Use of alcohol, tobacco or illicit drugs  Current medications and supplements including opioid prescriptions.  Functional ability and status Nutritional status Physical activity Advanced directives List of other  physicians Hospitalizations, surgeries, and ER visits in previous 12 months Vitals Screenings to include cognitive, depression, and falls Referrals and appointments  In addition, I have reviewed and discussed with patient certain preventive protocols, quality metrics, and best practice recommendations. A written personalized care plan for preventive services as well as general preventive health recommendations were provided to patient.   Due to this being a telephonic visit, the after visit summary with patients personalized plan was offered to patient via mail or my-chart. Patient preferred to pick up at office at next visit.   Loma Messing, LPN   [id number]   Nurse Health Advisor  Nurse Notes: none

## 2021-10-13 ENCOUNTER — Ambulatory Visit (INDEPENDENT_AMBULATORY_CARE_PROVIDER_SITE_OTHER): Payer: Medicare Other

## 2021-10-13 VITALS — Ht 63.0 in | Wt 198.0 lb

## 2021-10-13 DIAGNOSIS — Z0000 Encounter for general adult medical examination without abnormal findings: Secondary | ICD-10-CM

## 2021-10-13 NOTE — Patient Instructions (Signed)
[name] , Thank you for taking time to complete your Medicare Wellness Visit. I appreciate your ongoing commitment to your health goals. Please review the following plan we discussed and let me know if I can assist you in the future.   Screening recommendations/referrals: Colonoscopy: up to date, completed 01/06/18, no longer required after the age of 69 Mammogram: due, last completed 05/03/20, you plan to schedule your next appointment Bone Density: due, last completed 06/18/13, ordered on 09/13/21 by Dr. Danise Mina Recommended yearly ophthalmology/optometry visit for glaucoma screening and checkup Recommended yearly dental visit for hygiene and checkup  Vaccinations: Influenza vaccine: up to date Pneumococcal vaccine: up to date Tdap vaccine: up to date, completed 10/14/19, due 10/13/29 Shingles vaccine: Discuss with your local pharmacy Covid-19:up to date  Advanced directives: Please bring a copy of Living Will and/or Healthcare Power of Attorney for your chart.   Conditions/risks identified: see problem list  Next appointment: Follow up in one year for your annual wellness visit 10/18/22 @ 11:15am , this will be a telephone visit.    Preventive Care 6 Years and Older, Female Preventive care refers to lifestyle choices and visits with your health care provider that can promote health and wellness. What does preventive care include? A yearly physical exam. This is also called an annual well check. Dental exams once or twice a year. Routine eye exams. Ask your health care provider how often you should have your eyes checked. Personal lifestyle choices, including: Daily care of your teeth and gums. Regular physical activity. Eating a healthy diet. Avoiding tobacco and drug use. Limiting alcohol use. Practicing safe sex. Taking low-dose aspirin every day. Taking vitamin and mineral supplements as recommended by your health care provider. What happens during an annual well  check? The services and screenings done by your health care provider during your annual well check will depend on your age, overall health, lifestyle risk factors, and family history of disease. Counseling  Your health care provider may ask you questions about your: Alcohol use. Tobacco use. Drug use. Emotional well-being. Home and relationship well-being. Sexual activity. Eating habits. History of falls. Memory and ability to understand (cognition). Work and work Statistician. Reproductive health. Screening  You may have the following tests or measurements: Height, weight, and BMI. Blood pressure. Lipid and cholesterol levels. These may be checked every 5 years, or more frequently if you are over 38 years old. Skin check. Lung cancer screening. You may have this screening every year starting at age 53 if you have a 30-pack-year history of smoking and currently smoke or have quit within the past 15 years. Fecal occult blood test (FOBT) of the stool. You may have this test every year starting at age 65. Flexible sigmoidoscopy or colonoscopy. You may have a sigmoidoscopy every 5 years or a colonoscopy every 10 years starting at age 77. Hepatitis C blood test. Hepatitis B blood test. Sexually transmitted disease (STD) testing. Diabetes screening. This is done by checking your blood sugar (glucose) after you have not eaten for a while (fasting). You may have this done every 1-3 years. Bone density scan. This is done to screen for osteoporosis. You may have this done starting at age 94. Mammogram. This may be done every 1-2 years. Talk to your health care provider about how often you should have regular mammograms. Talk with your health care provider about your test results, treatment options, and if necessary, the need for more tests. Vaccines  Your health care provider may recommend certain  vaccines, such as: Influenza vaccine. This is recommended every year. Tetanus, diphtheria, and  acellular pertussis (Tdap, Td) vaccine. You may need a Td booster every 10 years. Zoster vaccine. You may need this after age 13. Pneumococcal 13-valent conjugate (PCV13) vaccine. One dose is recommended after age 1. Pneumococcal polysaccharide (PPSV23) vaccine. One dose is recommended after age 6. Talk to your health care provider about which screenings and vaccines you need and how often you need them. This information is not intended to replace advice given to you by your health care provider. Make sure you discuss any questions you have with your health care provider. Document Released: 11/18/2015 Document Revised: 07/11/2016 Document Reviewed: 08/23/2015 Elsevier Interactive Patient Education  2017 [place] Prevention in the Home Falls can cause injuries. They can happen to people of all ages. There are many things you can do to make your home safe and to help prevent falls. What can I do on the outside of my home? Regularly fix the edges of walkways and driveways and fix any cracks. Remove anything that might make you trip as you walk through a door, such as a raised step or threshold. Trim any bushes or trees on the path to your home. Use bright outdoor lighting. Clear any walking paths of anything that might make someone trip, such as rocks or tools. Regularly check to see if handrails are loose or broken. Make sure that both sides of any steps have handrails. Any raised decks and porches should have guardrails on the edges. Have any leaves, snow, or ice cleared regularly. Use sand or salt on walking paths during winter. Clean up any spills in your garage right away. This includes oil or grease spills. What can I do in the bathroom? Use night lights. Install grab bars by the toilet and in the tub and shower. Do not use towel bars as grab bars. Use non-skid mats or decals in the tub or shower. If you need to sit down in the shower, use a plastic, non-slip stool. Keep  the floor dry. Clean up any water that spills on the floor as soon as it happens. Remove soap buildup in the tub or shower regularly. Attach bath mats securely with double-sided non-slip rug tape. Do not have throw rugs and other things on the floor that can make you trip. What can I do in the bedroom? Use night lights. Make sure that you have a light by your bed that is easy to reach. Do not use any sheets or blankets that are too big for your bed. They should not hang down onto the floor. Have a firm chair that has side arms. You can use this for support while you get dressed. Do not have throw rugs and other things on the floor that can make you trip. What can I do in the kitchen? Clean up any spills right away. Avoid walking on wet floors. Keep items that you use a lot in easy-to-reach places. If you need to reach something above you, use a strong step stool that has a grab bar. Keep electrical cords out of the way. Do not use floor polish or wax that makes floors slippery. If you must use wax, use non-skid floor wax. Do not have throw rugs and other things on the floor that can make you trip. What can I do with my stairs? Do not leave any items on the stairs. Make sure that there are handrails on both sides of the stairs  and use them. Fix handrails that are broken or loose. Make sure that handrails are as long as the stairways. Check any carpeting to make sure that it is firmly attached to the stairs. Fix any carpet that is loose or worn. Avoid having throw rugs at the top or bottom of the stairs. If you do have throw rugs, attach them to the floor with carpet tape. Make sure that you have a light switch at the top of the stairs and the bottom of the stairs. If you do not have them, ask someone to add them for you. What else can I do to help prevent falls? Wear shoes that: Do not have high heels. Have rubber bottoms. Are comfortable and fit you well. Are closed at the toe. Do not  wear sandals. If you use a stepladder: Make sure that it is fully opened. Do not climb a closed stepladder. Make sure that both sides of the stepladder are locked into place. Ask someone to hold it for you, if possible. Clearly mark and make sure that you can see: Any grab bars or handrails. First and last steps. Where the edge of each step is. Use tools that help you move around (mobility aids) if they are needed. These include: Canes. Walkers. Scooters. Crutches. Turn on the lights when you go into a dark area. Replace any light bulbs as soon as they burn out. Set up your furniture so you have a clear path. Avoid moving your furniture around. If any of your floors are uneven, fix them. If there are any pets around you, be aware of where they are. Review your medicines with your doctor. Some medicines can make you feel dizzy. This can increase your chance of falling. Ask your doctor what other things that you can do to help prevent falls. This information is not intended to replace advice given to you by your health care provider. Make sure you discuss any questions you have with your health care provider. Document Released: 08/18/2009 Document Revised: 03/29/2016 Document Reviewed: 11/26/2014 Elsevier Interactive Patient Education  2017 [employer].

## 2021-10-17 ENCOUNTER — Ambulatory Visit: Payer: Medicare Other | Admitting: Family Medicine

## 2021-10-18 ENCOUNTER — Other Ambulatory Visit: Payer: Self-pay

## 2021-10-18 ENCOUNTER — Encounter: Payer: Self-pay | Admitting: Family Medicine

## 2021-10-18 ENCOUNTER — Ambulatory Visit (INDEPENDENT_AMBULATORY_CARE_PROVIDER_SITE_OTHER): Payer: Medicare Other | Admitting: Family Medicine

## 2021-10-18 VITALS — BP 134/68 | HR 79 | Temp 98.0°F | Ht 61.0 in | Wt 195.3 lb

## 2021-10-18 DIAGNOSIS — R634 Abnormal weight loss: Secondary | ICD-10-CM | POA: Diagnosis not present

## 2021-10-18 DIAGNOSIS — E611 Iron deficiency: Secondary | ICD-10-CM | POA: Diagnosis not present

## 2021-10-18 DIAGNOSIS — T6591XA Toxic effect of unspecified substance, accidental (unintentional), initial encounter: Secondary | ICD-10-CM

## 2021-10-18 DIAGNOSIS — R4189 Other symptoms and signs involving cognitive functions and awareness: Secondary | ICD-10-CM

## 2021-10-18 DIAGNOSIS — M5146 Schmorl's nodes, lumbar region: Secondary | ICD-10-CM

## 2021-10-18 NOTE — Patient Instructions (Addendum)
Call to schedule mammogram.  If interested, check with pharmacy about new 2 shot shingles series (shingrix).  Keep an eye on weight, let me know if ongoing drop or if new symptoms develop.  Good to see you today, return as needed or in 3 months for follow up visit.   Health Maintenance After Age 75 After age 18, you are at a higher risk for certain long-term diseases and infections as well as injuries from falls. Falls are a major cause of broken bones and head injuries in people who are older than age 62. Getting regular preventive care can help to keep you healthy and well. Preventive care includes getting regular testing and making lifestyle changes as recommended by your health care provider. Talk with your health care provider about: Which screenings and tests you should have. A screening is a test that checks for a disease when you have no symptoms. A diet and exercise plan that is right for you. What should I know about screenings and tests to prevent falls? Screening and testing are the best ways to find a health problem early. Early diagnosis and treatment give you the best chance of managing medical conditions that are common after age 66. Certain conditions and lifestyle choices may make you more likely to have a fall. Your health care provider may recommend: Regular vision checks. Poor vision and conditions such as cataracts can make you more likely to have a fall. If you wear glasses, make sure to get your prescription updated if your vision changes. Medicine review. Work with your health care provider to regularly review all of the medicines you are taking, including over-the-counter medicines. Ask your health care provider about any side effects that may make you more likely to have a fall. Tell your health care provider if any medicines that you take make you feel dizzy or sleepy. Strength and balance checks. Your health care provider may recommend certain tests to check your strength  and balance while standing, walking, or changing positions. Foot health exam. Foot pain and numbness, as well as not wearing proper footwear, can make you more likely to have a fall. Screenings, including: Osteoporosis screening. Osteoporosis is a condition that causes the bones to get weaker and break more easily. Blood pressure screening. Blood pressure changes and medicines to control blood pressure can make you feel dizzy. Depression screening. You may be more likely to have a fall if you have a fear of falling, feel depressed, or feel unable to do activities that you used to do. Alcohol use screening. Using too much alcohol can affect your balance and may make you more likely to have a fall. Follow these instructions at home: Lifestyle Do not drink alcohol if: Your health care provider tells you not to drink. If you drink alcohol: Limit how much you have to: 0-1 drink a day for women. 0-2 drinks a day for men. Know how much alcohol is in your drink. In the [place] one drink equals one 12 oz bottle of beer (355 mL), one 5 oz glass of wine (148 mL), or one 1 oz glass of hard liquor (44 mL). Do not use any products that contain nicotine or tobacco. These products include cigarettes, chewing tobacco, and vaping devices, such as e-cigarettes. If you need help quitting, ask your health care provider. Activity  Follow a regular exercise program to stay fit. This will help you maintain your balance. Ask your health care provider what types of exercise are appropriate for you.  If you need a cane or walker, use it as recommended by your health care provider. Wear supportive shoes that have nonskid soles. Safety  Remove any tripping hazards, such as rugs, cords, and clutter. Install safety equipment such as grab bars in bathrooms and safety rails on stairs. Keep rooms and walkways well-lit. General instructions Talk with your health care provider about your risks for falling. Tell your health  care provider if: You fall. Be sure to tell your health care provider about all falls, even ones that seem minor. You feel dizzy, tiredness (fatigue), or off-balance. Take over-the-counter and prescription medicines only as told by your health care provider. These include supplements. Eat a healthy diet and maintain a healthy weight. A healthy diet includes low-fat dairy products, low-fat (lean) meats, and fiber from whole grains, beans, and lots of fruits and vegetables. Stay current with your vaccines. Schedule regular health, dental, and eye exams. Summary Having a healthy lifestyle and getting preventive care can help to protect your health and wellness after age 39. Screening and testing are the best way to find a health problem early and help you avoid having a fall. Early diagnosis and treatment give you the best chance for managing medical conditions that are more common for people who are older than age 56. Falls are a major cause of broken bones and head injuries in people who are older than age 72. Take precautions to prevent a fall at home. Work with your health care provider to learn what changes you can make to improve your health and wellness and to prevent falls. This information is not intended to replace advice given to you by your health care provider. Make sure you discuss any questions you have with your health care provider. Document Revised: 03/13/2021 Document Reviewed: 03/13/2021 Elsevier Patient Education  [employer].

## 2021-10-18 NOTE — Progress Notes (Signed)
Patient ID: [name], female    DOB: 02/20/1946, 75 y.o.   MRN: [id number]  This visit was conducted in person.  BP 134/68    Pulse 79    Temp 98 F (36.7 C) (Temporal)    Ht 5\' 1"  (1.549 m)    Wt 195 lb 5 oz (88.6 kg)    SpO2 94%    BMI 36.90 kg/m    CC: AMW f/u visit  Subjective:   HPI: [name] is a 75 y.o. female presenting on 10/18/2021 for Annual Exam [facility] prt 2.  Wants to discuss what to do until neuro appt.  Also, wants to discuss memory issues when taking opioids or steroids.)   Saw health advisor last week for medicare wellness visit. Note reviewed.    No results found.  Flowsheet Row Clinical Support from 10/13/2021 in [place] at [facility]  PHQ-2 Total Score 0       Fall Risk  10/13/2021 11/09/2020 10/11/2020 08/22/2020 10/08/2019  Falls in the past year? 1 0 1 1 1   Comment - - tripped and fell - has sores on her leg and has wrappings and a special shoe that tripped her over  Number falls in past yr: 1 0 0 0 1  Injury with Fall? 0 0 0 0 0  Risk for fall due to : Other (Comment) - Medication side effect - Impaired mobility;Impaired balance/gait;Medication side effect  Risk for fall due to: Comment tripped - - - -  Follow up Falls prevention discussed Falls evaluation completed Falls evaluation completed;Falls prevention discussed Falls evaluation completed Falls evaluation completed;Falls prevention discussed   Recent reassuring memory evaluation. [name] been feeling significantly better since minimizing sedating medications. Notes continues reaching for words occasionally but overall symptoms have improved. Does have neurology appt Jaynee Eagles) scheduled for 12/2021.   ~35 lb weight loss over the past 18 months, non intentional. Appetite ok.   Preventative: COLONOSCOPY WITH PROPOFOL 01/06/2018 - diverticulosis, decreased sphincter tone, no f/u needed [name], Kirke Corin, MD)  Well woman exam with OBGYN Dr Kennon Rounds last seen 05/2020  Mammogram yearly 04/2020  at [facility]. Due for f/u - advised to call and schedule.  DEXA 06/2016 WNL.  Lung cancer screening - not eligible Flu shot - yearly COVID vaccine Moderna 02/2020, 03/2020, booster 11/2020, bivalent booster 08/2021 Td 2020 Pneumovax 05/2013 , Prevnar-13 09/2014 shingrix - discussed, declines. H/o shingles.  Advanced directives: doesn't want prolonged life support. HCPOA - likely one of her sisters. Packet previously provided.  Seat belt use discussed.  Sunscreen use discussed. No changing moles on skin. Sees derm and onc yearly (h/o melanoma).  Ex smoker.  Alcohol - none  Dentist q6 mo (due) Eye exam yearly (due) Bowel - no constipation  Bladder - no incontinence   Lives alone. Sister and husband live next door   Lives in [place].   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   Edu: some college Activity: no regular exercise Diet: poor - good water, rare fruits/vegetables     Relevant past medical, surgical, family and social history reviewed and updated as indicated. Interim medical history since our last visit reviewed. Allergies and medications reviewed and updated. Outpatient Medications Prior to Visit  Medication Sig Dispense Refill   acetaminophen (TYLENOL) 650 MG CR tablet Take 2 tablets (1,300 mg total) by mouth 2 (two) times daily as needed for pain.     albuterol (VENTOLIN HFA) 108 (90 Base) MCG/ACT  inhaler INHALE 2 PUFFS BY MOUTH EVERY 6 HOURS AS NEEDED FOR WHEEZING/SHORTNESS OF BREATH 8.5 each 0   aspirin EC 81 MG tablet Take 81 mg by mouth at bedtime. Melanoma prevention     furosemide (LASIX) 40 MG tablet TAKE 1 TABLET 2 (TWO) TIMES DAILY AS NEEDED FOR FLUID. TAKE SECOND DOSE IF NEEDED FOR LEG SWELLING 60 tablet 2   loratadine (CLARITIN) 10 MG tablet Take 10 mg by mouth daily.     losartan (COZAAR) 50 MG tablet Take 1 tablet (50 mg total) by mouth daily. 90 tablet 1   montelukast (SINGULAIR) 10 MG tablet TAKE 1 TABLET BY MOUTH EVERY DAY 90 tablet 1    Multiple Vitamin (MULTIVITAMIN WITH MINERALS) TABS tablet Take 1 tablet by mouth daily.     Polyvinyl Alcohol-Povidone (TEARS PLUS OP) Place 1 drop into both eyes daily as needed (dry eyes/ redness/ burning).      potassium chloride SA (KLOR-CON M20) 20 MEQ tablet TAKE 1 TABLET BY MOUTH TWICE A DAY 60 tablet 3   PRESCRIPTION MEDICATION CPAP     Probiotic Product (PROBIOTIC DAILY PO) Take 1 tablet by mouth daily.      triamcinolone (NASACORT) 55 MCG/ACT AERO nasal inhaler Place 2 sprays into the nose daily. In each nostril     UNABLE TO FIND Take by mouth.     Vitamin D, Ergocalciferol, (DRISDOL) 1.25 MG (50000 UNIT) CAPS capsule TAKE 1 CAPSULE BY MOUTH EVERY 7 DAYS 12 capsule 3   No facility-administered medications prior to visit.     Per HPI unless specifically indicated in ROS section below Review of Systems  Objective:  BP 134/68    Pulse 79    Temp 98 F (36.7 C) (Temporal)    Ht 5\' 1"  (1.549 m)    Wt 195 lb 5 oz (88.6 kg)    SpO2 94%    BMI 36.90 kg/m   Wt Readings from Last 3 Encounters:  10/18/21 195 lb 5 oz (88.6 kg)  10/13/21 198 lb (89.8 kg)  09/27/21 198 lb 1 oz (89.8 kg)      Physical Exam Vitals and nursing note reviewed.  Constitutional:      Appearance: Normal appearance. She is obese. She is not ill-appearing.     Comments: Ambulates without assistance today   HENT:     Head: Normocephalic and atraumatic.     Right Ear: Tympanic membrane, ear canal and external ear normal. There is no impacted cerumen.     Left Ear: Tympanic membrane, ear canal and external ear normal. There is no impacted cerumen.  Eyes:     General:        Right eye: No discharge.        Left eye: No discharge.     Extraocular Movements: Extraocular movements intact.     Conjunctiva/sclera: Conjunctivae normal.     Pupils: Pupils are equal, round, and reactive to light.  Neck:     Thyroid: No thyroid mass or thyromegaly.  Cardiovascular:     Rate and Rhythm: Normal rate and regular  rhythm.     Pulses: Normal pulses.     Heart sounds: Murmur (3/6 systolic murmur) heard.  Pulmonary:     Effort: Pulmonary effort is normal. No respiratory distress.     Breath sounds: Normal breath sounds. No wheezing, rhonchi or rales.  Abdominal:     General: Bowel sounds are normal. There is no distension.     Palpations: Abdomen is soft. There is  no mass.     Tenderness: There is no abdominal tenderness. There is no guarding or rebound.     Hernia: No hernia is present.  Musculoskeletal:     Cervical back: Normal range of motion. No rigidity.     Right lower leg: No edema.     Left lower leg: No edema.  Lymphadenopathy:     Cervical: No cervical adenopathy.  Skin:    General: Skin is warm and dry.     Findings: No rash.  Neurological:     General: No focal deficit present.     Mental Status: She is alert. Mental status is at baseline.  Psychiatric:        Mood and Affect: Mood normal.        Behavior: Behavior normal.      Results for orders placed or performed in visit on 09/27/21  Vitamin B12  Result Value Ref Range   Vitamin B-12 638 211 - 911 pg/mL  TSH  Result Value Ref Range   TSH 2.05 0.35 - 5.50 uIU/mL  RPR  Result Value Ref Range   RPR Ser Ql NON-REACTIVE NON-REACTIVE  IBC panel  Result Value Ref Range   Iron 44 42 - 145 ug/dL   Transferrin 175.0 (L) 212.0 - 360.0 mg/dL   Saturation Ratios 18.0 (L) 20.0 - 50.0 %   TIBC 245.0 (L) 250.0 - 450.0 mcg/dL   *Note: Due to a large number of results and/or encounters for the requested time period, some results have not been displayed. A complete set of results can be found in Results Review.   Lab Results  Component Value Date   WBC 7.1 08/15/2021   HGB 12.5 08/15/2021   HCT 37.9 08/15/2021   MCV 92.2 08/15/2021   PLT 179 08/15/2021    Lab Results  Component Value Date   CREATININE 0.59 08/15/2021   BUN 16 08/15/2021   NA 142 08/15/2021   K 4.0 08/15/2021   CL 105 08/15/2021   CO2 31 08/15/2021    Lab Results  Component Value Date   ALT 16 08/15/2021   AST 12 (L) 08/15/2021   ALKPHOS 48 08/15/2021   BILITOT 0.6 08/15/2021    Assessment & Plan:  This visit occurred during the SARS-CoV-2 public health emergency.  Safety protocols were in place, including screening questions prior to the visit, additional usage of staff PPE, and extensive cleaning of exam room while observing appropriate contact time as indicated for disinfecting solutions.   Problem List Items Addressed This Visit     Weight loss    Discussed recent unintentional weight loss, up to 35 lbs over the past 18 months.  Fortunately recent labwork largely reassuring.  She will let me know if notes any ongoing weight loss for further evaluation to likely include imaging.       Schmorl's nodes of lumbar region    Reviewed recent neurosurgery note - previous L3 compression fracture actually thought to be benign schmorl's node. Discussed this - no need for DEXA scan at this time.       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 substance - Primary    Reassuring memory evaluation completed 09/2021.  Recent reassuring memory labwork including TSH, RPR.  Recent issues with cognitive impairment most consistent with high sensitivity to any sedating or anticholinergic medication - will continue to avoid from now on.       Low iron    Actually iron level normal, with impaired iron transport  of unclear cause.  Consider return to heme/onc.         No orders of the defined types were placed in this encounter.  No orders of the defined types were placed in this encounter.    Patient instructions: Call to schedule mammogram.  If interested, check with pharmacy about new 2 shot shingles series (shingrix).  Keep an eye on weight, let me know if ongoing drop or if new symptoms develop.  Good to see you today, return as needed or in 3 months for follow up visit.   Follow up plan: Return in about 3 months (around  01/16/2022) for follow up visit.  [name], MD

## 2021-10-18 NOTE — Assessment & Plan Note (Addendum)
Reviewed recent neurosurgery note - previous L3 compression fracture actually thought to be benign schmorl's node. Discussed this - no need for DEXA scan at this time.

## 2021-10-19 NOTE — Assessment & Plan Note (Addendum)
Reassuring memory evaluation completed 09/2021.  Recent reassuring memory labwork including TSH, RPR.  Recent issues with cognitive impairment most consistent with high sensitivity to any sedating or anticholinergic medication - will continue to avoid from now on.

## 2021-10-19 NOTE — Assessment & Plan Note (Signed)
Discussed recent unintentional weight loss, up to 35 lbs over the past 18 months.  Fortunately recent labwork largely reassuring.  She will let me know if notes any ongoing weight loss for further evaluation to likely include imaging.

## 2021-10-19 NOTE — Assessment & Plan Note (Signed)
Actually iron level normal, with impaired iron transport of unclear cause.  Consider return to heme/onc.

## 2021-11-09 ENCOUNTER — Other Ambulatory Visit: Payer: Self-pay

## 2021-11-09 ENCOUNTER — Ambulatory Visit (INDEPENDENT_AMBULATORY_CARE_PROVIDER_SITE_OTHER): Payer: Medicare Other | Admitting: Family Medicine

## 2021-11-09 ENCOUNTER — Encounter: Payer: Self-pay | Admitting: Family Medicine

## 2021-11-09 VITALS — BP 142/72 | HR 66 | Temp 98.0°F | Ht 61.0 in | Wt 197.5 lb

## 2021-11-09 DIAGNOSIS — R609 Edema, unspecified: Secondary | ICD-10-CM

## 2021-11-09 DIAGNOSIS — I83893 Varicose veins of bilateral lower extremities with other complications: Secondary | ICD-10-CM

## 2021-11-09 DIAGNOSIS — T6591XA Toxic effect of unspecified substance, accidental (unintentional), initial encounter: Secondary | ICD-10-CM

## 2021-11-09 DIAGNOSIS — I83019 Varicose veins of right lower extremity with ulcer of unspecified site: Secondary | ICD-10-CM

## 2021-11-09 DIAGNOSIS — R4189 Other symptoms and signs involving cognitive functions and awareness: Secondary | ICD-10-CM | POA: Diagnosis not present

## 2021-11-09 DIAGNOSIS — L97919 Non-pressure chronic ulcer of unspecified part of right lower leg with unspecified severity: Secondary | ICD-10-CM | POA: Diagnosis not present

## 2021-11-09 DIAGNOSIS — I83891 Varicose veins of right lower extremities with other complications: Secondary | ICD-10-CM

## 2021-11-09 MED ORDER — POTASSIUM CHLORIDE CRYS ER 20 MEQ PO TBCR: 40.0000 meq | EXTENDED_RELEASE_TABLET | Freq: Two times a day (BID) | ORAL | 0 refills | Status: DC

## 2021-11-09 NOTE — Progress Notes (Signed)
Patient ID: [name], female    DOB: 1945/11/24, 76 y.o.   MRN: [id number]  This visit was conducted in person.  BP (!) 142/72    Pulse 66    Temp 98 F (36.7 C) (Temporal)    Ht 5\' 1"  (1.549 m)    Wt 197 lb 8 oz (89.6 kg)    SpO2 96%    BMI 37.32 kg/m    CC:  Chief Complaint  Patient presents with   Leg Injury    Left-Hit leg on shelf at [place] about a month ago-Now leg is scabbed and burning   Leg Swelling    Subjective:   HPI: [name] is a 76 y.o. female with HTN  and venous insufficiency/venous ulcers presenting on 11/09/2021 for Leg Injury (Left-Hit leg on shelf at [place] about a month ago-Now leg is scabbed and burning) and Leg Swelling  Right leg injury on 11/09/2020 when hit leg on a shelf 1 month ago.. caused bruised. Caused mild swelling.  Now noted burning up and down right leg  Noted scabs on left posterior leg  She has been cleaning with antibiotic soap. She normally uses compression hose but cannot wear given swelling.  She has been elevating her legs.   No change in redness or heat.  No fever, no flu like illness.   On lasix 40 mg daily   Hx of staph infection on left left 1 year ago.   She has noted some change in memory lately in last 6 months, off and on.. driving confusion. Was lost for 3 hours once.. had to get police help.  Cancer center did MRI brain  Saw PCP on 10/20/2021: "Reassuring memory evaluation completed 09/2021.  Recent reassuring memory labwork including TSH, RPR.  Recent issues with cognitive impairment most consistent with high sensitivity to any sedating or anticholinergic medication - will continue to avoid from now on.'  She has been feeling better off these medications.  She was late to office today given missed turn. Father with [facility]      Relevant past medical, surgical, family and social history reviewed and updated as indicated. Interim medical history since our last visit  reviewed. Allergies and medications reviewed and updated. Outpatient Medications Prior to Visit  Medication Sig Dispense Refill   acetaminophen (TYLENOL) 650 MG CR tablet Take 2 tablets (1,300 mg total) by mouth 2 (two) times daily as needed for pain.     albuterol (VENTOLIN HFA) 108 (90 Base) MCG/ACT inhaler INHALE 2 PUFFS BY MOUTH EVERY 6 HOURS AS NEEDED FOR WHEEZING/SHORTNESS OF BREATH 8.5 each 0   aspirin EC 81 MG tablet Take 81 mg by mouth at bedtime. Melanoma prevention     furosemide (LASIX) 40 MG tablet TAKE 1 TABLET 2 (TWO) TIMES DAILY AS NEEDED FOR FLUID. TAKE SECOND DOSE IF NEEDED FOR LEG SWELLING 60 tablet 2   loratadine (CLARITIN) 10 MG tablet Take 10 mg by mouth daily.     losartan (COZAAR) 50 MG tablet Take 1 tablet (50 mg total) by mouth daily. 90 tablet 1   montelukast (SINGULAIR) 10 MG tablet TAKE 1 TABLET BY MOUTH EVERY DAY 90 tablet 1   Multiple Vitamin (MULTIVITAMIN WITH MINERALS) TABS tablet Take 1 tablet by mouth daily.     Polyvinyl Alcohol-Povidone (TEARS PLUS OP) Place 1 drop into both eyes daily as needed (dry eyes/ redness/ burning).      potassium chloride SA (KLOR-CON M20) 20 MEQ tablet TAKE 1  TABLET BY MOUTH TWICE A DAY 60 tablet 3   PRESCRIPTION MEDICATION CPAP     Probiotic Product (PROBIOTIC DAILY PO) Take 1 tablet by mouth daily.      triamcinolone (NASACORT) 55 MCG/ACT AERO nasal inhaler Place 2 sprays into the nose daily. In each nostril     UNABLE TO FIND Take by mouth.     Vitamin D, Ergocalciferol, (DRISDOL) 1.25 MG (50000 UNIT) CAPS capsule TAKE 1 CAPSULE BY MOUTH EVERY 7 DAYS 12 capsule 3   No facility-administered medications prior to visit.     Per HPI unless specifically indicated in ROS section below Review of Systems Objective:  BP (!) 142/72    Pulse 66    Temp 98 F (36.7 C) (Temporal)    Ht 5\' 1"  (1.549 m)    Wt 197 lb 8 oz (89.6 kg)    SpO2 96%    BMI 37.32 kg/m   Wt Readings from Last 3 Encounters:  11/09/21 197 lb 8 oz (89.6 kg)   10/18/21 195 lb 5 oz (88.6 kg)  10/13/21 198 lb (89.8 kg)      Physical Exam    Results for orders placed or performed in visit on 09/27/21  Vitamin B12  Result Value Ref Range   Vitamin B-12 638 211 - 911 pg/mL  TSH  Result Value Ref Range   TSH 2.05 0.35 - 5.50 uIU/mL  RPR  Result Value Ref Range   RPR Ser Ql NON-REACTIVE NON-REACTIVE  IBC panel  Result Value Ref Range   Iron 44 42 - 145 ug/dL   Transferrin 175.0 (L) 212.0 - 360.0 mg/dL   Saturation Ratios 18.0 (L) 20.0 - 50.0 %   TIBC 245.0 (L) 250.0 - 450.0 mcg/dL   *Note: Due to a large number of results and/or encounters for the requested time period, some results have not been displayed. A complete set of results can be found in Results Review.    This visit occurred during the SARS-CoV-2 public health emergency.  Safety protocols were in place, including screening questions prior to the visit, additional usage of staff PPE, and extensive cleaning of exam room while observing appropriate contact time as indicated for disinfecting solutions.   COVID 19 screen:  No recent travel or known exposure to COVID19 The patient denies respiratory symptoms of COVID 19 at this time. The importance of social distancing was discussed today.   Assessment and Plan Problem List Items Addressed This Visit     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 substance    Improving memory off of anticholinergic medication. Follow up with PCP regarding this as planned.      Varicose veins of both lower extremities with complications - Primary   Venous stasis ulcer of right lower leg with edema of right lower leg (HCC)    No clear ongoing infection. Increase lasix  to 2 daily x  5 days.   Increase potassium while on higher dose of lasix to 2 tabs twice daily.  As soon as able pout back on compression hose.  Continue elevating legs.  Wash legs with antibacterial soap.           Eliezer Lofts, MD

## 2021-11-09 NOTE — Patient Instructions (Addendum)
Increase lasix  to 2 daily x  5 days.   Increase potassium while on higher dose of lasix to 2 tabs twice daily.  As soon as able pout back on compression hose.  Continue elevating legs.  Wash legs with antibacterial soap.

## 2021-11-14 ENCOUNTER — Telehealth: Payer: Self-pay

## 2021-11-14 NOTE — Telephone Encounter (Signed)
How has she done with increased lasix dose to bid for 5 days as recommended by Dr Diona Browner?  Has she been able to restart compression stocking use?  Would offer OV if no improvement, I could see tomorrow at 12:30pm.

## 2021-11-14 NOTE — Telephone Encounter (Signed)
[facility] - Client TELEPHONE ADVICE RECORD [facility] Patient Name: [name] [place] Gender: Female DOB: Jul 08, 1946 Age: 76 Y 76 M 6 D Return Phone Number: [phone] (Primary), [phone] (Secondary) Address: City/ State/ Zip: [place] [address] Client [facility] - Client Client Site [facility] Provider [name] - MD Contact Type Call Who Is Calling Patient / Member / Family / Caregiver Call Type Triage / Clinical Relationship To Patient Self Return Phone Number [phone] (Primary) Chief Complaint Unclassified Symptom Reason for Call Symptomatic / Request for Health Information Initial Comment Caller's having scabbing that are falling off. She has clear fluid that she can not get to stop. She has Venius insufficiency. Translation No Nurse Assessment Nurse: Doyle Askew, RN, Beth Date/Time (Eastern Time): 11/14/2021 9:43:24 AM Confirm and document reason for call. If symptomatic, describe symptoms. ---Caller's having scabbing that are falling off, has clear fluid that she can not get to stop, has Venous insufficiency. Caller did see PCP last week and told no infection. Does the patient have any new or worsening symptoms? ---Yes Will a triage be completed? ---Yes Related visit to physician within the last 2 weeks? ---Yes Does the PT have any chronic conditions? (i.e. diabetes, asthma, this includes High risk factors for pregnancy, etc.) ---Yes List chronic conditions. ---venous insufficieny Is this a behavioral health or substance abuse call? ---No Guidelines Guideline Title Affirmed Question Affirmed Notes Nurse Date/Time (Eastern Time) Sores [1] Using antibiotic ointment > 1 week AND [2] sore not completely healed Doyle Askew, RN, Beth 11/14/2021 9:45:31 AM Disp. Time [name] Time) Disposition Final User 11/14/2021 9:49:17 AM See PCP within 24 Hours Yes Doyle Askew, RN,  Beth PLEASE NOTE: All timestamps contained within this report are represented as [place] Standard Time. CONFIDENTIALTY NOTICE: This fax transmission is intended only for the addressee. It contains information that is legally privileged, confidential or otherwise protected from use or disclosure. If you are not the intended recipient, you are strictly prohibited from reviewing, disclosing, copying using or disseminating any of this information or taking any action in reliance on or regarding this information. If you have received this fax in error, please notify us immediately by telephone so that we can arrange for its return to [place]. Phone: [phone], Toll-Free: [phone], Fax: [phone] Page: 2 of 2 Call Id: [id number] [place] Disagree/Comply Comply Caller Understands Yes PreDisposition Call Doctor Care Advice Given Per Guideline SEE PCP WITHIN 24 HOURS: * IF OFFICE WILL BE OPEN: You need to be examined within the next 24 hours. Call your doctor (or NP/PA) when the office opens and make an appointment. CLEANING: * Wash the area 2 to 3 times daily with antibacterial soap and warm water. ANTIBIOTIC OINTMENT: * Put a small amount of antibiotic ointment on the infected area 3 times per day. * You can get this over-the-counter (OTC) at a drugstore. * Cover the area with a clean gauze or an adhesive bandage (such as a Band-Aid). This will help prevention scratching and spread. * You become worse * Fever occurs CALL BACK IF: CARE ADVICE given per Sores (Adult) guideline. Referrals REFERRED TO PCP OFFIC

## 2021-11-14 NOTE — Telephone Encounter (Signed)
Per chart review tab pt wa seen on 11/09/21 by Dr Diona Browner; access nurse gave care advice at end of the call. Sending note to Dr Diona Browner and Butch Penny CMA and Dr Darnell Level as PCP.

## 2021-11-14 NOTE — Telephone Encounter (Signed)
Access nurse told pt that she needed to be seen within 24 hours. Pt is waiting on decision on whether to make an appt or not

## 2021-11-14 NOTE — Telephone Encounter (Signed)
Spoke to patient by telephone and was advised that she can not tell much of a difference taking the increased dose of the the Lasix. Patient stated that she is not urinating any more than she usually does. Patient stated that she has one spot on her leg that is draining fluid that she can not get to stop. Patient stated that she has not started back using the compression stockings yet because her legs are sore. Patient stated that she would like to come in to the office at [facility] tomorrow to be seen at 12:30 pm. Patient is being added to Dr. Bosie Clos schedule. Negative covid screening.

## 2021-11-15 ENCOUNTER — Ambulatory Visit (INDEPENDENT_AMBULATORY_CARE_PROVIDER_SITE_OTHER): Payer: Medicare Other | Admitting: Family Medicine

## 2021-11-15 ENCOUNTER — Other Ambulatory Visit: Payer: Self-pay

## 2021-11-15 ENCOUNTER — Encounter: Payer: Self-pay | Admitting: Family Medicine

## 2021-11-15 VITALS — BP 156/80 | HR 94 | Temp 97.7°F | Ht 61.0 in | Wt 189.4 lb

## 2021-11-15 DIAGNOSIS — I83891 Varicose veins of right lower extremities with other complications: Secondary | ICD-10-CM

## 2021-11-15 DIAGNOSIS — L97919 Non-pressure chronic ulcer of unspecified part of right lower leg with unspecified severity: Secondary | ICD-10-CM

## 2021-11-15 DIAGNOSIS — I10 Essential (primary) hypertension: Secondary | ICD-10-CM | POA: Diagnosis not present

## 2021-11-15 DIAGNOSIS — I83893 Varicose veins of bilateral lower extremities with other complications: Secondary | ICD-10-CM

## 2021-11-15 DIAGNOSIS — I83019 Varicose veins of right lower extremity with ulcer of unspecified site: Secondary | ICD-10-CM

## 2021-11-15 DIAGNOSIS — I872 Venous insufficiency (chronic) (peripheral): Secondary | ICD-10-CM | POA: Diagnosis not present

## 2021-11-15 DIAGNOSIS — R609 Edema, unspecified: Secondary | ICD-10-CM

## 2021-11-15 NOTE — Progress Notes (Addendum)
Patient ID: [name], female    DOB: 06-28-1946, 76 y.o.   MRN: [id number]  This visit was conducted in person.  BP (!) 156/80 (BP Location: Left Arm, Cuff Size: Normal)    Pulse 94    Temp 97.7 F (36.5 C) (Temporal)    Ht 5\' 1"  (1.549 m)    Wt 189 lb 7 oz (85.9 kg)    SpO2 96%    BMI 35.79 kg/m   BP Readings from Last 3 Encounters:  11/15/21 (!) 156/80  11/09/21 (!) 142/72  10/18/21 134/68   CC: leg swelling Subjective:   HPI: [name] is a 76 y.o. female presenting on 11/15/2021 for Leg Swelling (Here for f/u.  States swelling is improved but has some weeping from right leg. )   Comes in today wearing plastic bag over right foot due to increased weeping from skin of lower leg.   Seen last week by Dr Diona Browner with worsening BLE leg swelling and significant skin breakdown to RLE - treated with increased lasix x5 days (to 40mg  BID) without significant improvement. Finally last night she noted increased UOP. Notes ongoing weeping from R leg.   BP trending up recently.      Relevant past medical, surgical, family and social history reviewed and updated as indicated. Interim medical history since our last visit reviewed. Allergies and medications reviewed and updated. Outpatient Medications Prior to Visit  Medication Sig Dispense Refill   acetaminophen (TYLENOL) 650 MG CR tablet Take 2 tablets (1,300 mg total) by mouth 2 (two) times daily as needed for pain.     albuterol (VENTOLIN HFA) 108 (90 Base) MCG/ACT inhaler INHALE 2 PUFFS BY MOUTH EVERY 6 HOURS AS NEEDED FOR WHEEZING/SHORTNESS OF BREATH 8.5 each 0   aspirin EC 81 MG tablet Take 81 mg by mouth at bedtime. Melanoma prevention     furosemide (LASIX) 40 MG tablet TAKE 1 TABLET 2 (TWO) TIMES DAILY AS NEEDED FOR FLUID. TAKE SECOND DOSE IF NEEDED FOR LEG SWELLING 60 tablet 2   loratadine (CLARITIN) 10 MG tablet Take 10 mg by mouth daily.     losartan (COZAAR) 50 MG tablet Take 1 tablet (50 mg total) by mouth daily. 90  tablet 1   montelukast (SINGULAIR) 10 MG tablet TAKE 1 TABLET BY MOUTH EVERY DAY 90 tablet 1   Multiple Vitamin (MULTIVITAMIN WITH MINERALS) TABS tablet Take 1 tablet by mouth daily.     Polyvinyl Alcohol-Povidone (TEARS PLUS OP) Place 1 drop into both eyes daily as needed (dry eyes/ redness/ burning).      potassium chloride SA (KLOR-CON M20) 20 MEQ tablet Take 2 tablets (40 mEq total) by mouth 2 (two) times daily. 20 tablet 0   PRESCRIPTION MEDICATION CPAP     Probiotic Product (PROBIOTIC DAILY PO) Take 1 tablet by mouth daily.      triamcinolone (NASACORT) 55 MCG/ACT AERO nasal inhaler Place 2 sprays into the nose daily. In each nostril     UNABLE TO FIND Take by mouth.     Vitamin D, Ergocalciferol, (DRISDOL) 1.25 MG (50000 UNIT) CAPS capsule TAKE 1 CAPSULE BY MOUTH EVERY 7 DAYS 12 capsule 3   No facility-administered medications prior to visit.     Per HPI unless specifically indicated in ROS section below Review of Systems  Objective:  BP (!) 156/80 (BP Location: Left Arm, Cuff Size: Normal)    Pulse 94    Temp 97.7 F (36.5 C) (Temporal)    Ht  5\' 1"  (1.549 m)    Wt 189 lb 7 oz (85.9 kg)    SpO2 96%    BMI 35.79 kg/m   Wt Readings from Last 3 Encounters:  11/15/21 189 lb 7 oz (85.9 kg)  11/09/21 197 lb 8 oz (89.6 kg)  10/18/21 195 lb 5 oz (88.6 kg)      Physical Exam Vitals and nursing note reviewed.  Constitutional:      Appearance: Normal appearance.  Musculoskeletal:        General: Swelling and tenderness present.     Right lower leg: Edema present.     Left lower leg: Edema present.     Comments:  Nonpitting swelling BLE  RLE - skin breakdown lower leg above ankle laterally and medially with scab formation without significant erythema or drainage  RLE wounds are not clean and seem contaminated with cat hair  Constant weeping from medial scab as well as from linear abrasion anteriorly  Diminished pulses bilaterally   Skin:    General: Skin is warm.     Findings:  Abrasion and wound present. No bruising, erythema or petechiae.     Comments:  Non-infected cat scratches to BLE Maceration to skin of R sole and R toes  Neurological:     Mental Status: She is alert.   RLE wound care: RLE skin and scabbed wounds cleaned with normal saline and gauze, then dressed in unna boot. Pt tolerated well.      Assessment & Plan:  This visit occurred during the SARS-CoV-2 public health emergency.  Safety protocols were in place, including screening questions prior to the visit, additional usage of staff PPE, and extensive cleaning of exam room while observing appropriate contact time as indicated for disinfecting solutions.   Problem List Items Addressed This Visit     Hypertension    BP elevation noted today despite losartan 50mg  and recent increased lasix dose to bid - I did recommend she continue BID lasix dosing for the next 3-5 days.      Chronic venous insufficiency   Varicose veins of both lower extremities with complications   Venous stasis ulcer of right lower leg with edema of right lower leg (HCC) - Primary    Known chronic venous insufficiency. RLE wounds are scabbed with inflammation, but don't think actively infected. Wounds seem contaminated with cat hair - I cleaned RLE with normal saline and gauze.  She really enjoys her cats but discussed importance of hygiene for wound healing and how she may have ongoing issues if continues having cat induced injuries to skin of lower extremities.  I placed unna boot to RLE and asked her to return in 5 days for removal, remove sooner if fever, streaking redness, worsening pain or other concerns.  She has previously tolerated unna boot well, has had ABI withs normal RLE arterial circulation.         No orders of the defined types were placed in this encounter.  No orders of the defined types were placed in this encounter.    Patient Instructions  Keep unna boot if able until next visit Return Monday at  12:30pm for follow up visit.   Follow up plan: No follow-ups on file.  [name], MD

## 2021-11-15 NOTE — Patient Instructions (Addendum)
Keep unna boot if able until next visit Return Monday at 12:30pm for follow up visit.

## 2021-11-16 DIAGNOSIS — I83891 Varicose veins of right lower extremities with other complications: Secondary | ICD-10-CM | POA: Insufficient documentation

## 2021-11-16 DIAGNOSIS — I83019 Varicose veins of right lower extremity with ulcer of unspecified site: Secondary | ICD-10-CM | POA: Insufficient documentation

## 2021-11-16 NOTE — Assessment & Plan Note (Addendum)
Known chronic venous insufficiency. RLE wounds are scabbed with inflammation, but don't think actively infected. Wounds seem contaminated with cat hair - I cleaned RLE with normal saline and gauze.  She really enjoys her cats but discussed importance of hygiene for wound healing and how she may have ongoing issues if continues having cat induced injuries to skin of lower extremities.  I placed unna boot to RLE and asked her to return in 5 days for removal, remove sooner if fever, streaking redness, worsening pain or other concerns.  She has previously tolerated unna boot well, has had ABI withs normal RLE arterial circulation.

## 2021-11-16 NOTE — Assessment & Plan Note (Signed)
BP elevation noted today despite losartan 50mg  and recent increased lasix dose to bid - I did recommend she continue BID lasix dosing for the next 3-5 days.

## 2021-11-20 ENCOUNTER — Ambulatory Visit: Payer: Medicare Other | Admitting: Family Medicine

## 2021-11-20 ENCOUNTER — Ambulatory Visit (INDEPENDENT_AMBULATORY_CARE_PROVIDER_SITE_OTHER): Payer: Medicare Other | Admitting: Family Medicine

## 2021-11-20 ENCOUNTER — Telehealth: Payer: Self-pay | Admitting: Family Medicine

## 2021-11-20 ENCOUNTER — Other Ambulatory Visit: Payer: Self-pay

## 2021-11-20 ENCOUNTER — Encounter: Payer: Self-pay | Admitting: Family Medicine

## 2021-11-20 DIAGNOSIS — I83891 Varicose veins of right lower extremities with other complications: Secondary | ICD-10-CM | POA: Diagnosis not present

## 2021-11-20 DIAGNOSIS — R609 Edema, unspecified: Secondary | ICD-10-CM

## 2021-11-20 DIAGNOSIS — I83019 Varicose veins of right lower extremity with ulcer of unspecified site: Secondary | ICD-10-CM

## 2021-11-20 DIAGNOSIS — L97919 Non-pressure chronic ulcer of unspecified part of right lower leg with unspecified severity: Secondary | ICD-10-CM | POA: Diagnosis not present

## 2021-11-20 NOTE — Patient Instructions (Signed)
Try to keep the boot on and then follow up with the wound clinic.  I'll update Dr. Darnell Level.  Please update [place] as needed.  Take care.  Glad to see you.

## 2021-11-20 NOTE — Progress Notes (Signed)
This visit occurred during the SARS-CoV-2 public health emergency.  Safety protocols were in place, including screening questions prior to the visit, additional usage of staff PPE, and extensive cleaning of exam room while observing appropriate contact time as indicated for disinfecting solutions.  Patient here today to recheck leg and remove [name] Mems boot that was placed on 11/15/21 with Dr. Danise Mina. Patient removed boot Friday. Patient also has appt with wound clinic on Thursday 11/23/21.  She took [place] boot off 3 days ago since the swelling was better and it was sliding down her leg. No fevers.  No vomiting.  H/o venous ulceration prev.  She is taking lasix BID recently.    Meds, vitals, and allergies reviewed.   ROS: Per HPI unless specifically indicated in ROS section   Nad Ncat Intact dorsalis pedis pulse on the right foot.  Decreased edema at the right ankle.  She has venous stasis changes on the medial distal right shin, 10 x 6 cm without spreading erythema or weeping.

## 2021-11-20 NOTE — Telephone Encounter (Signed)
Patient was supposed to come in today for unna boot follow up but was cancelled as I had to be out of office.  Plz call for update on leg wound, offer appt tomorrow at 12:30pm or4pm

## 2021-11-20 NOTE — Telephone Encounter (Signed)
Lvm asking pt to call back.  Need an update on leg wound.   Fyi to Dr. Darnell Level, pt has OV with Dr. Sharlene Motts today.

## 2021-11-21 ENCOUNTER — Ambulatory Visit: Payer: Medicare Other | Admitting: Family Medicine

## 2021-11-22 ENCOUNTER — Telehealth: Payer: Self-pay | Admitting: Family Medicine

## 2021-11-22 NOTE — Telephone Encounter (Signed)
Agree. Thanks

## 2021-11-22 NOTE — Telephone Encounter (Signed)
Would be ideal if able to keep it on until wound clinic appointment.  If it's bothering her - painful, uncomfortable - she can take it off.

## 2021-11-22 NOTE — Telephone Encounter (Signed)
Pt called stating that she has a Uniboot on her right leg. Pt states that she had an appointment with Dr Damita Dunnings on 11/20/21 and he replaced the old [place] with a new one. Pt states that she had an appointment on 11/23/21 with [facility]. Pt states they had to cancel and cant take her until 11/27/21. Pt is asking should she keep the boot on until her appointment. Please advise

## 2021-11-22 NOTE — Telephone Encounter (Signed)
Patient notified as instructed by telephone and verbalized understanding. 

## 2021-11-22 NOTE — Assessment & Plan Note (Signed)
Discussed options.  Reasonable to elevate leg when possible.  Unna boot reapplied after discussion and consent from patient.  This was applied without complication or difficulty.  Routine cautions given to patient.  She will follow-up with the wound clinic.  Routed to PCP as FYI.

## 2021-11-23 ENCOUNTER — Ambulatory Visit: Payer: Medicare Other | Admitting: Physician Assistant

## 2021-11-27 ENCOUNTER — Other Ambulatory Visit
Admission: RE | Admit: 2021-11-27 | Discharge: 2021-11-27 | Disposition: A | Discharge status: Home / Self Care | Payer: Medicare Other | Source: Ambulatory Visit | Attending: Physician Assistant | Admitting: Physician Assistant

## 2021-11-27 ENCOUNTER — Other Ambulatory Visit: Payer: Self-pay

## 2021-11-27 ENCOUNTER — Encounter: Payer: Medicare Other | Attending: Physician Assistant | Admitting: Physician Assistant

## 2021-11-27 DIAGNOSIS — L97811 Non-pressure chronic ulcer of other part of right lower leg limited to breakdown of skin: Secondary | ICD-10-CM | POA: Diagnosis not present

## 2021-11-27 DIAGNOSIS — I87331 Chronic venous hypertension (idiopathic) with ulcer and inflammation of right lower extremity: Secondary | ICD-10-CM | POA: Diagnosis not present

## 2021-11-27 DIAGNOSIS — I890 Lymphedema, not elsewhere classified: Secondary | ICD-10-CM | POA: Insufficient documentation

## 2021-11-27 DIAGNOSIS — L089 Local infection of the skin and subcutaneous tissue, unspecified: Secondary | ICD-10-CM | POA: Insufficient documentation

## 2021-11-27 DIAGNOSIS — L97919 Non-pressure chronic ulcer of unspecified part of right lower leg with unspecified severity: Secondary | ICD-10-CM | POA: Insufficient documentation

## 2021-11-28 NOTE — Progress Notes (Signed)
[name], [name] ([id number]) Visit Report for 11/27/2021 Chief Complaint Document Details Patient Name: [name], [name]. Date of Service: 11/27/2021 11:15 AM Medical Record Number: [id number] Patient Account Number: [id number] Date of Birth/Sex: [birth year] (76 y.o. F) Treating RN: Cornell Barman Primary Care Provider: [name] Other Clinician: Referring Provider: [name] Treating Provider/Extender: [name] in Treatment: 0 Information Obtained from: Patient Chief Complaint Right LE Ulcer Electronic Signature(s) Signed: 11/27/2021 12:19:13 PM By: Worthy Keeler PA-C Entered By: Worthy Keeler on 11/27/2021 12:19:12 [name], [name] ([id number]) -------------------------------------------------------------------------------- HPI Details Patient Name: [name], [name]. Date of Service: 11/27/2021 11:15 AM Medical Record Number: [id number] Patient Account Number: [id number] Date of Birth/Sex: 1946/08/03 (76 y.o. F) Treating RN: Cornell Barman Primary Care Provider: [name] Other Clinician: Referring Provider: [name] Treating Provider/Extender: [name] in Treatment: 0 History of Present Illness HPI Description: Pleasant 76 year old with history of chronic venous insufficiency. No diabetes or peripheral vascular disease. Left ABI 1.29. Questionable history of left lower extremity DVT. She developed a recurrent ulceration on her left lateral calf in December 2015, which she attributes to poor diet and subsequent lower extremity edema. She underwent endovenous laser ablation of her left greater saphenous vein in 2010. She underwent laser ablation of accessory branch of left GSV in April 2016 by Dr. Kellie Simmering at [facility]. She was previously wearing Unna boots, which she tolerated well. Tolerating 2 layer compression and cadexomer iodine. She returns to clinic for follow-up and is without new complaints. She denies any significant pain at this time.  She reports persistent pain with pressure. No claudication or ischemic rest pain. No fever or chills. No drainage. READMISSION 11/13/16; this is a 76 year old woman who is not a diabetic. She is here for a review of a painful area on her left medial lower extremity. I note that she was seen here previously last year for wound I believe to be in the same area. At that time she had undergone previously a left greater saphenous vein ablation by Dr. Kellie Simmering and she had a ablation of the anterior accessory branch of the left greater saphenous vein in March 2016. Seeing that the wound actually closed over. In reviewing the history with her today the ulcer in this area has been recurrent. She describes a biopsy of this area in 2009 that only showed stasis physiology. She also has a history of today malignant melanoma in the right shoulder for which she follows with Dr. 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 Do not see that she has ever had arterial studies in the left leg. She tells me she has a follow-up with Dr. Kellie Simmering in roughly 10 days In any case she developed the reopening of this area roughly a month ago. On the background of this she describes rapidly increasing edema which has responded to Lasix 40 mg and metolazone 2.5 mg as well as the patient's lymph massage. She has been told she has both venous insufficiency and lymphedema but she cannot tolerate compression stockings 11/28/16; the patient saw Dr. Kellie Simmering recently. Per the patient he did arterial Dopplers in the office that did not show evidence of arterial insufficiency, per the patient he stated "treat this like an ordinary venous ulcer". She also saw her dermatologist Dr. Ronnald Ramp who felt that this was more of a vascular ulcer. In general things are improving although she  arrives today with increasing bilateral lower extremity  edema with weeping a deeper fluid through the wound on the left medial leg compatible with some degree of lymphedema 12/04/16; the patient's wound is fully epithelialized but I don't think fully healed. We will do another week of depression with Promogran and TCA however I suspect we'll be able to discharge her next week. This is a very unusual-looking wound which was initially a figure-of-eight type wound lying on its side surrounded by petechial like hemorrhage. She has had venous ablation on this side. She apparently does not have an arterial issue per Dr. Kellie Simmering. She saw her dermatologist thought it was "vascular". Patient is definitely going to need ongoing compression and I talked about this with her today she will go to elastic therapy after she leaves here next week 12/11/16; the patient's wound is not completely closed today. She has surrounding scar tissue and in further discussion with the patient it would appear that she had ulcers in this area in 2009 for a prolonged period of time ultimately requiring a punch biopsy of this area that only showed venous insufficiency. I did not previously pickup on this part of the history from the patient. 12/18/16; the patient's wound is completely epithelialized. There is no open area here. She has significant bilateral venous insufficiency with secondary lymphedema to a mild-to-moderate degree she does not have compression stockings.. She did not say anything to me when I was in the room, she told our intake nurse that she was still having pain in this area. This isn't unusual recurrent small open area. She is going to go to elastic therapy to obtain compression stockings. 12/25/16; the patient's wound is fully epithelialized. There is no open area here. The patient describes some continued episodic discomfort in this area medial left calf. However everything looks fine and healed here. She is been to elastic therapy and caught herself 15-20 mmHg  stockings, they apparently were having trouble getting 20-30 mm stockings in her size 01/22/17; this is a patient we discharged from the clinic a month ago. She has a recurrent open wound on her medial left calf. She had 15 mm support stockings. I told her I thought she needed 20-30 mm compression stockings. She tells me that she has been ill with hospitalization secondary to asthma and is been found to have severe hypokalemia likely secondary to a combination of Lasix and metolazone. This morning she noted blistering and leaking fluid on the posterior part of her left leg. She called our intake nurse urgently and we was saw her this afternoon. She has not had any real discomfort here. I don't know that she's been wearing any stockings on this leg for at least 2-3 days. ABIs in this clinic were 1.21 on the right and 1.3 on the left. She is previously seen vascular surgery who does not think that there is a peripheral arterial issue. 01/30/17; Patient arrives with no open wound on the left leg. She has been to elastic therapy and obtained 20-54mmhg below knee stockings and she has one on the right leg today. READMISSION 02/19/18; this Reif is a now 76 year old patient we've had in this clinic perhaps 3 times before. I had last looked at her from January 07 December 2016 with an area on the medial left leg. We discharged her on 12/25/16 however she had to be readmitted on 01/22/17 with a recurrence. I have in my notes that we discharged her on 20-30 mm stockings although she tells me  she was only wearing support hose because she cannot get stockings on predominantly related to her cervical spine surgery/issues. She has had previous ablations done by vein and vascular in [place] including a great saphenous vein ablation on the left with an anterior accessory branch ablation I think both of these were in 2016. On one of the previous visit she had a biopsy noted 2009 that was negative. She is not felt to  have an arterial issue. She is not a diabetic. She does have a history of obstructive sleep apnea hypertension asthma as well as chronic venous insufficiency and lymphedema. On this occasion she noted 2 dry scaly patch on her left leg. She tried to put lotion on this it didn't really help. There were 2 open areas.the patient has been seeing her primary physician from 02/05/18 through 02/14/18. She had Unna boots applied. The superior wound now on the lateral left leg has closed but she's had one wound that remains open on the lateral left leg. This is not the same spot as we dealt with in 2018. ABIs in this clinic were 1.3 bilaterally [name], [name] ([id number]) 02/26/18; patient has a small wound on the left lateral calf. Dimensions are down. She has chronic venous insufficiency and lymphedema. 03/05/18; small open area on the left lateral calf. Dimensions are down. Tightly adherent necrotic debris over the surface of the wound which was difficult to remove. Also the dressing [over collagen] stuck to the wound surface. This was removed with some difficulty as well. Change the primary dressing to Hydrofera Blue ready 03/12/18; small open area on the left lateral calf. Comes in with tightly adherent surface eschar as well as some adherent Hydrofera Blue. 03/19/18; open area on the left lateral calf. Again adherent surface eschar as well as some adherent Hydrofera Blue nonviable subcutaneous tissue. She complained of pain all week even with the reduction from 4-3 layer compression I put on last week. Also she had an increase in her ankle and calf measurements probably related to the same thing. 03/26/18; open area on the left lateral calf. A very small open area remains here. We used silver alginate starting last week as the Hydrofera Blue seem to stick to the wound bed. In using 4-layer compression 04/02/18; the open area in the left lateral calf at some adherent slough which I removed there is no open area  here. We are able to transition her into her own compression stocking. Truthfully I think this is probably his support hose. However this does not maintain skin integrity will be limited. She cannot put over the toe compression stockings on because of neck problems hand problems etc. She is allergic to the lining layer of juxta lites. We might be forced to use extremitease stocking should this fail READMIT 11/24/2018 Patient is now a 76 year old woman who is not a diabetic. She has been in this clinic on at least 3 previous occasions largely with recurrent wounds on her left leg secondary to chronic venous insufficiency with secondary lymphedema. Her situation is complicated by inability to get stockings on and an allergy to neoprene which is apparently a component and at least juxta lites and other stockings. As a result she really has not been wearing any stockings on her legs. She tells [place] that roughly 2 or 3 weeks ago she started noticing a stinging sensation just above her ankle on the left medial aspect. She has been diagnosed with pseudogout and she wondered whether this was what she was  experiencing. She tried to dress this with something she bought at the store however subsequently it pulled skin off and now she has an open wound that is not improving. She has been using Vaseline gauze with a cover bandage. She saw her primary doctor last week who put an Haematologist on her. ABIs in this clinic was 1.03 on the left 2/12; the area is on the left medial ankle. Odd-looking wound with what looks to be surface epithelialization but a multitude of small petechial openings. This clearly not closed yet. We have been using silver alginate under 3 layer compression with TCA 2/19; the wound area did not look quite as good this week. Necrotic debris over the majority of the wound surface which required debridement. She continues to have a multitude of what looked to be small petechial openings. She  reminds [place] that she had a biopsy on this initially during her first outbreak in 2015 in [place] dermatology. She expresses concern about this being a possible melanoma. 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 I assured her that this does not look anything like melanoma. Except for the petechial reaction it does look like a venous insufficiency area and she certainly has evidence of this on both sides 2/26; a difficult area on the left medial ankle. The patient clearly has chronic venous hypertension with some degree of lymphedema. The odd thing about the area is the small petechial hemorrhages. I am not really sure how to explain this. This was present last time and this is not a compression injury. We have been using Hydrofera Blue which I changed to last week 3/4; still using Hydrofera Blue. Aggressive debridement today. She does not have known arterial issues. She has seen Dr. Kellie Simmering at [facility] vein and vascular and and has an ablation on the left. [Anterior accessory branch of the greater saphenous]. From what I remember they did not feel she had an arterial issue. The patient has had this area biopsied in 2009 at [facility] dermat[facility] and by her recollection they said this was "stasis". She is also follow-up with dermatology locally who thought that this was more of a vascular issue 3/11; using Hydrofera Blue. Aggressive debridement today. She does not have an arterial issue. We are using 3 layer compression although we may need to go to 4. The patient has been in for multiple changes to her wrap since I last saw her a week ago. She says that the area was leaking. I do not have too much more information on what was found 01/19/19 on evaluation today patient was actually being seen for a nurse visit when unfortunately she had the area on her left lateral lower extremity as well as weeping from the  right lower extremity that became apparent. Therefore we did end up actually seeing her for a full visit with myself. She is having some pain at this site as well but fortunately nothing too significant at this point. No fevers, chills, nausea, or vomiting noted at this time. 3/18-Patient is back to the clinic with the left leg venous leg ulcer, the ulcer is larger in size, has a surface that is densely adherent with fibrinous tissue, the Hydrofera Blue was used but is densely adherent and there was difficulty in removing it. The right lower extremity was also wrapped for weeping edema. Patient has a new area over the left lateral foot above the malleolus that is  small and appears to have no debris with intact surrounding skin. Patient is on increased dose of Lasix also as a means to edema management 3/25; the patient has a nonhealing venous ulcer on the medial left leg and last week developed a smaller area on the lateral left calf. We have been using Hydrofera Blue with a contact layer. 4/1; no major change in these wounds areas. Left medial and more recently left lateral calf. I tried Iodoflex last week to aid in debridement she did not tolerate this. She stated her pain was terrible all week. She took the top layer of the 4 layer compression off. 4/8; the patient actually looks somewhat better in terms of her more prominent left lateral calf wound. There is some healthy looking tissue here. She is still complaining of a lot of discomfort. 4/15; patient in a lot of pain secondary to sciatica. She is on a prednisone taper prescribed by her primary physician. She has the 2 areas one on the left medial and more recently a smaller area on the left lateral calf. Both of these just above the malleoli 4/22; her back pain is better but she still states she is very uncomfortable and now feels she is intolerant to the [employer]. No real change in the wounds we have been using Sorbact. She has been  previously intolerant to Iodoflex. There is not a lot of option about what we can use to debride this wound under compression that she no doubt needs. sHe states Ultram no longer works for her pain 4/29; no major change in the wounds slightly increased depth. Surface on the original medial wound perhaps somewhat improved however the more recent area on the lateral left ankle is 100% covered in very adherent debris we have been using Sorbact. She tolerates 4 layer compression well and her edema control is a lot better. She has not had to come in for a nurse check 5/6; no major change in the condition of the wounds. She did consent to debridement today which was done with some difficulty. Continuing Sorbact. She did not tolerate Iodoflex. She was in for a check of her compression the day after we wrapped her last week this was adjusted but nothing much was found 5/13; no major change in the condition or area of the wounds. I was able to get a fairly aggressive debridement done on the lateral left leg wound. Even using Sorbact under compression. She came back in on Friday to have the wrap changed. She says she felt uncomfortable on the lateral aspect of her ankle. She has a long history of chronic venous insufficiency including previous ablation surgery on this side. 5/20-Patient returns for wounds on left leg with both wounds covered in slough, with the lateral leg wound larger in size, she has been in 3 layer compression and felt more comfortable, she describes pain in ankle, in leg and pins and needles in foot, and is about to try Pamelor for this 6/3; wounds on the left lateral and left medial leg. The area medially which is the most recent of the 2 seems to have had the largest increase in [place], LEYANNA BITTMAN. ([id number]) dimensions. We have been using Sorbac to try and debride the surface. She has been to see orthopedics they apparently did a plain x-ray that was indeterminant. Diagnosed her with  neuropathy and they have ordered an MRI to determine if there is underlying osteomyelitis. This was not high on my thought list but I suppose  it is prudent. We have advised her to make an appointment with vein and vascular in [place]. She has a history of a left greater saphenous and accessory vein ablations I wonder if there is anything else that can be done from a surgical point of view to help in these difficult refractory wounds. We have previously healed this wound on one occasion but it keeps on reopening [medial side] 6/10; deep tissue culture I did last week I think on the left medial wound showed both moderate E. coli and moderate staph aureus [MSSA]. She is going to require antibiotics and I have chosen Augmentin. We have been using Sorbact and we have made better looking wound surface on both sides but certainly no improvement in wound area. She was back in last Friday apparently for a dressing changes the wrap was hurting her outer left ankle. She has not managed to get a hold of vein and vascular in [place]. We are going to have to make her that appointment 6/17; patient is tolerating the Augmentin. She had an MRI that I think was ordered by orthopedic surgeon this did not show osteomyelitis or an abscess did suggest cellulitis. We have been using Sorbact to the lateral and medial ankles. We have been trying to arrange a follow-up appointment with vein and vascular in [place] or did her original ablations. We apparently an area sent the request to vein and vascular in [facility] 6/24; patient has completed the Augmentin. We do not yet have a vein and vascular appointment in [place]. I am not sure what the issue is here we have asked her to call tomorrow. We are using Sorbact. Making some improvements and especially the medial wound. Both surfaces however look better medial and lateral. 7/1; the patient has been in contact with vein and vascular in [place] but has not yet  received an appointment. Using Sorbact we have gradually improve the wound surface with no improvement in surface area. She is approved for Apligraf but the wound surface still is not completely viable. She has not had to come in for a dressing change 7/8; the patient has an appointment with vein and vascular on 7/31 which is a Friday afternoon. She is concerned about getting back here for [place] to dress her wounds. I think it is important to have them goal for her venous reflux/history of ablations etc. to see if anything else can be done. She apparently tested positive for 1 of the blood tests with regards to lupus and saw a rheumatologist. He has raised the issue of vasculitis again. I have had this thought in the past however the evidence seems overwhelming that this is a venous reflux etiology. If the rheumatologist tells me there is clinical and laboratory investigation is positive for lupus I will rethink this. 7/15; the patient's wound surfaces are quite a bit better. The medial area which was her original wound now has no depth although the lateral wound which was the more recent area actually appears larger. Both with viable surfaces which is indeed better. Using Sorbact. I wanted to use Apligraf on her however there is the issue of the vein and vascular appointment on 7/31 at 2:00 in the afternoon which would not allow her to get back to be rewrapped and they would no doubt remove the graft 7/22; the patient's wound surfaces have moderate amount of debris although generally look better. The lateral one is larger with 2 small satellite areas superiorly. We are waiting for her vein and  vascular appointment on 7/31. She has been approved for Apligraf which I would like to use after th 7/29; wound surfaces have improved no debridement is required we have been using Sorbact. She sees vein and vascular on Friday with this so question of whether anything can be done to lessen the likelihood of  recurrence and/or speed the healing of these areas. She is already had previous ablations. She no doubt has severe venous hypertension 8/5-Patient returns at 1 week, she was in [place] for 3 days by her podiatrist, we have been using so backed to the wound, she has increased pain in both the wounds on the left lower leg especially the more distal one on the lateral aspect 8/12-Patient returns at 1 week and she is agreeable to having debridement in both wounds on her left leg today. We have been using Sorbact, and vascular studies were reviewed at last visit 8/19; the patient arrives with her wounds fairly clean and no debridement is required. We have used Sorbact which is really done a nice job in cleaning up these very difficult wound surfaces. The patient saw Dr. Donzetta Matters of vascular surgery on 7/31. He did not feel that there was an arterial component. He felt that her treated greater saphenous vein is adequately addressed and that the small saphenous vein did not appear to be involved significantly. She was also noted to have deep venous reflux which is not treatable. Dr. Donzetta Matters mentioned the possibility of a central obstructive component leading to reflux and he offered her central venography. She wanted to discuss this or think about it. I have urged her to go ahead with this. She has had recurrent difficult wounds in these areas which do heal but after months in the clinic. If there is anything that can be done to reduce the likelihood of this I think it is worth it. 9/2 she is still working towards getting follow-up with Dr. Donzetta Matters to schedule her CT. Things are quite a bit worse venography. I put Apligraf on 2 weeks ago on both wounds on the medial and lateral part of her left lower leg. She arrives in clinic today with 3 superficial additional wounds above the area laterally and one below the wound medially. She describes a lot of discomfort. I think these are probably wrapped injuries. Does not  look like she has cellulitis. 07/20/2019 on evaluation today patient appears to be doing somewhat poorly in regard to her lower extremity ulcers. She in fact showed signs of erythema in fact we may even be dealing with an infection at this time. Unfortunately I am unsure if this is just infection or if indeed there may be some allergic reaction that occurred as a result of the Apligraf application. With that being said that would be unusual but nonetheless not impossible in this patient is one who is unfortunately allergic to quite a bit. Currently we have been using the Sorbact which seems to do as well as anything for her. I do think we may want to obtain a culture today to see if there is anything showing up there that may need to be addressed. 9/16; noted that last week the wounds look worse in 1 week follow-up of the Apligraf. Using Sorbact as of 2 days ago. She arrives with copious amounts of drainage and new skin breakdown on the back of the left calf. The wounds arm more substantial bilaterally. There is a fair amount of swelling in the left calf no overt DVT there  is edema present I think in the left greater than right thigh. She is supposed to go on 9/28 for CT venography. The wounds on the medial and lateral calf are worse and she has new skin breakdown posteriorly at least new for me. This is almost developing into a circumferential wound area The Apligraf was taken off last week which I agree with things are not going in the right direction a culture was done we do not have that back yet. She is on Augmentin that she started 2 days ago 9/23; dressing was changed by her nurses on Monday. In general there is no improvement in the wound areas although the area looks less angry than last week. She did get Augmentin for MSSA cultured on the 14th. She still appears to have too much swelling in the left leg even with 3 layer compression 9/30; the patient underwent her procedure on 9/28 by Dr.  Donzetta Matters at [facility]. She was discovered to have the common iliac vein measuring 12.2 mm but at the level of L4-L5 measured 3 mm. After stenting it measured 10 mm. 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 Rouleaux flow in the common femoral and femoral vein was observed much improved after stenting. We are using silver alginate to the wounds on the medial and lateral ankle on the left. 4 layer compression 10/7; the patient had fluid swelling around her knee and 4 layer compression. At the advice of vein and vascular this was reduced to 3 layer which she is tolerating better. We have been using silver alginate under 3 layer compression since last Friday 10/14; arrives with the areas on the left ankle looking a lot better. Inflammation in the area also a lot better. She came in for a nurse check on 10/9 10/21; continued nice improvement. Slight improvements in surface area of both the medial and lateral wounds on the left. A lot of the satellite lesions in the weeping erythema around these from stasis dermatitis is resolved. We have been using silver alginate NATOSHA, BOU ([id number]) 10/28; general improvement in the entire wound areas although not a lot of change in dimensions the wound certainly looks better. There is a lot less in terms of venous inflammation. Continue silver alginate this week however look towards Hydrofera Blue next week 11/4; very adherent debris on the medial wound left wound is not as bad. We have been using silver alginate. Change to [facility] today 11/11; very adherent debris on both wound areas. She went to vein and vascular last week and follow-up they put in [place] boot on this today. He says the [facility] was adherent. Wound is definitely not as good as last week. Especially on the left there the satellite lesions look more prominent 11/18; absolutely no better. erythema on lateral aspect with tenderness. 09/30/2019 on  evaluation today patient appears to actually be doing better. Dr. Dellia Nims did put her on doxycycline last week which I do believe has helped her at this point. Fortunately there is no signs of active infection at this time. No fevers, chills, nausea, vomiting, or diarrhea. I do believe he may want extend the doxycycline for 7 additional days just to ensure everything does completely cleared up the patient is in agreement with that plan. Otherwise she is going require some sharp debridement today 12/2; patient is completing a 2-week course of doxycycline. I gave her this empirically for inflammation as well as infection when I last saw her  2 weeks ago. All of this seems to be better. She is using silver alginate she has the area on the medial aspect of the larger area laterally and the 2 small satellite regions laterally above the major wound. 12/9; the patient's wound on the left medial and left lateral calf look really quite good. We have been using silver alginate. She saw vein and vascular in follow-up on 10/09/2019. She has had a previous left greater saphenous vein ablation by Dr. Oscar La in 2016. More recently she underwent a left common iliac vein stent by Dr. 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 The swelling is improved and certainly the wounds have improved. The patient shows [place] today area on the right medial calf there is almost no wound but leaking lymphedema. She says she start this started 3 or 4 days ago. She did not traumatize it. It is not painful. She does not wear compression on that side 12/16; the patient continues to do well laterally. Medially still requiring debridement. The area on the right calf did not materialize to anything and is not currently open. We wrapped this last time. She has support stockings for that leg although I am not sure they are going to provide adequate compression 12/23; the lateral wound looks stable. Medially still requiring debridement for  tightly adherent fibrinous debris. We've been using silver alginate. Surface area not any different 12/30; neither wound is any better with regards to surface and the area on the left lateral is larger. I been using silver alginate to the left lateral which look quite good last week and Sorbact to the left medial 11/11/2019. Lateral wound area actually looks better and somewhat smaller. Medial still requires a very aggressive debridement today. We have been using Sorbact on both wound areas 1/13; not much better still adherent debris bilaterally. I been using Sorbact. She has severe venous hypertension. Probably some degree of dermal fibrosis distally. I wonder whether tighter compression might help and I am going to try that today. We also need to work on the bioburden 1/20; using Sorbact. She has severe venous hypertension status post stent placement for pelvic vein compression. We applied gentamicin last time to see if we could reduce bioburden I had some discussion with her today about the use of pentoxifylline. This is occasionally used in this setting for wounds with refractory venous insufficiency. However this interacts with Plavix. She tells me that she was put on this after stent placement for 3 months. She will call Dr. Claretha Cooper office to discuss 1/27; we are using gentamicin under Sorbact. 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 She has a stent. Wound medially is measuring smaller this week. Laterally measuring slightly larger although she has some satellite lesions superiorly 2/3; gentamicin under Sorbact under 4-layer compression. 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 She has a stent on Plavix. Her wounds are measuring smaller this week. More substantially laterally where there is a satellite lesion superiorly. 2/10; gentamicin under Sorbac. 4-layer compression. Patient communicated with Dr. Donzetta Matters at vein and vascular in  [place]. He is okay with the patient coming off Plavix I will therefore start her on pentoxifylline for a 1 month trial. In general her wounds look better today. I had some concerns about swelling in the left thigh however she measures 61.5 on the right and 63 on the mid thigh which does not suggest there is any difficulty. The patient is not describing any pain. 2/17; gentamicin under  Sorbac 4-layer compression. She has been on pentoxifylline for 1 week and complains of loose stool. No nausea she is eating and drinking well 2/24; the patient apparently came in 2 days ago for a nurse visit when her wrap fell down. Both areas look a little worse this week macerated medially and satellite lesions laterally. Change to silver alginate today 3/3; wounds are larger today especially medially. She also has more swelling in her foot lower leg and I even noted some swelling in her posterior thigh which is tender. I wonder about the patency of her stent. Fortuitously she sees Dr. Claretha Cooper group on Friday 3/10; Mrs. Guerrier was seen by vein and vascular on 3/5. The patient underwent ultrasound. There was no evidence of thrombosis involving the IVC no evidence of thrombosis involving the right common iliac vein there is no evidence of thrombosis involving the right external iliac vein the left external vein is also patent. The right common iliac vein stent appears patent bilateral common femoral veins are compressible and appear patent. I was concerned about the left common iliac stent however it looks like this is functional. She has some edema in the posterior thigh that was tender she still has that this week. I also note they had trouble finding the pulses in her left foot and booked her for an ABI baseline in 4 weeks. She will follow up in 6 months for repeat IVC duplex. The patient stopped the pentoxifylline because of diarrhea. It does not look like that was being effective in any case. I have advised her  to go back on her aspirin 81 mg tablet, vascular it also suggested this 3/17; comes in today with her wound surfaces a lot better. The excoriations from last week considerably better probably secondary to the TCA. We have been using silver alginate 3/24; comes in today with smaller wounds both medially and laterally. Both required debridement. There are 2 small satellite areas superiorly laterally. She also has a very odd bandlike area in the mid calf almost looking like there was a weakness in the wrap in a localized area. I would write this off as being this however anteriorly she has a small raised ballotable area that is very tender almost reminiscent of an abscess but there was no obvious purulent surface to it. 02/04/20 upon evaluation today patient appears to be doing fairly well in regard to her wounds today. Fortunately there is no signs of active infection at this time. No fevers, chills, nausea, vomiting, or diarrhea. She has been tolerating the dressing changes without complication. Fortunately I feel like she is showing signs of improvement although has been sometime since have seen her. Nonetheless the area of concern that Dr. Dellia Nims had last week where she had possibly an area of the wrap that was we can allow the leg to bulge appears to be doing significantly better today there is no signs of anything worsening. [name], [name] ([id number]) 4/7; the patient's wounds on her medial and lateral left leg continue to contract. We have been using a regular alginate. Last week she developed an area on the right medial lower leg which is probably a venous ulcer as well. 4/14; the wounds on her left medial and lateral lower leg continue to contract. Surface eschar. We have been using regular alginate. The area on the right medial lower leg is closed. We have been putting both legs under 4-layer contraction. The patient went back to see vein and vascular she had arterial studies done  which  were apparently "quite good" per the patient although I have not read their notes I have never felt she had an arterial issue. 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 This is been longstanding and refractory to exercise, leg elevation and longstanding use of compression wraps in our clinic as well as compression stockings on the times we have been able to get these to heal 4/21; we thought she actually might be close this week however she arrives in clinic with a lot of edema in her upper left calf and into her posterior thigh. This is been an intermittent problem here. She says the wrap fell down but it was replaced with a nurse visit on Monday. We are using calcium alginate to the wounds and the wound sizes there not terribly larger than last week but there is a lot more edema 4/28; again wound edges are smaller on both sides. Her edema is better controlled than last time. She is obtained her compression pumps from [employer] although they have not been to her home to set these up. 5/5; left medial and left lateral both look stable. I am not sure the medial is any smaller. We have been using calcium alginate under 4-layer compression. oShe had an area on the right medial. This was eschared today. We have been wrapping this as well. She does not tolerate external compression stockings due to a history of various contact allergies. She has her compression pumps however the representative from the company is coming on her to show her how to use these tomorrow 5/19; patient with severe chronic venous insufficiency secondary to central venous disease. She had a stent placed in her left common iliac vein. She has done better since but still difficult to control wounds. She comes in today with nothing open on the right leg. Her areas on the left medial and left lateral are just about closed. We are using calcium alginate under 4-layer compression.  She is using her external compression pumps at home She only has 15-20 support stockings. States she cannot get anything tighter than that on. 03/30/20-Patient returns at 1 week, the wounds on the left leg are both slightly bigger, the last week she was on 3 layer compression which started to slide down. She is starting to use her lymphedema pumps although she stated on 1 day her right ankle started to swell up and she have to stop that day. Unfortunately the open area seem to oscillate between improving to the point of healing and then flaring up all to do with effectiveness of compression or lack of due to the left leg topography not keeping the compression wraps from rolling down 6/2 patient comes in with a 15/20 mmHg stocking on the right leg. She tells me that she developed a lot of swelling in her ankles she saw orthopedics she was felt to possibly be having a flare of pseudogout versus some other type of arthritis. She was put on steroids for a respiratory issue so that helps with the inflammation. She has not been using the pumps all week. She thinks the left thigh is more swollen than usual and I would agree with that. She has an appointment with Dr. Donzetta Matters 9 days or so from now 6/9; both wounds on the left medial and left lateral are smaller. We have been using calcium alginate under compression. She does not have an open wound on the right leg she is using a stocking and her  compression pumps things are going well. She has an appointment with Dr. Donzetta Matters with regards to her stent in the left common iliac vein 6/16; the wounds on the left medial and left lateral ankle continues to contract. The patient saw Dr. Donzetta Matters and I think he seems satisfied. Ordered follow-up venous reflux studies on both sides in September. Cautioned that she may need thigh-high stockings. She has been using calcium alginate under compression on the left and her own stocking on the right leg. She tells [place] there are no open  wounds on the right 6/23; left lateral is just about closed. Medial required debridement today. We have been using calcium alginate. Extensive discussion about the compression pumps she is only using these on 25 mmHg states she could not take 40 or 30 when the wrap came out to her home to demonstrate these. He said they should not feel tight 6/30; the left lateral wound has a slight amount of eschar. . The area medially is about the same using Hydrofera Blue. 7/7; left lateral wound still has some eschar. I will remove this next week may be closed. The area medially is very small using Hydrofera Blue with improvement. Unfortunately the stockings fell down. Unfortunately the blisters have developed at the edge of where the wrap fell. When this happened she says her legs hurt she did not use her pumps. We are not open Monday for her to come in and change the wraps and she had an appointment yesterday. She also tells me that she is going to have an MRI of her back. She is having pain radiating into her left anterior leg she thinks her from an L5 disc. She saw Dr. Ellene Route of neurosurgery 7/14; the area on the left lateral ankle area is closed. Still a small area medially however it looks better as well. We have been using Hydrofera Blue under 4-layer compression 7/21; left lateral ankle is still closed however her wound on the medial left calf is actually larger. This is probably because Hydrofera Blue got stuck to the wound. She came in for a nurse change on Friday and will do that again this week I was concerned about the amount of swelling that she had last week however she is using her compression pumps twice a day and the swelling seems well controlled 7/28; remaining wound on the left medial lower leg is smaller. We have been using moistened silver collagen under compression she is coming back for a nurse visit. For reasons that were not really clear she was just keeping her legs elevated and not  using her compression pumps. I have asked her to use the compression pumps. She does not have any wounds on the right leg 06/15/20-Patient returns at 2 weeks, her LLE edema is worse and she developed a blister wound that is new and has bigger posterior calf wound on right, we are using Prisma with pad, 4 layer compression. she has been on lasix 40 mg daily 8/18; patient arrives today with things a lot worse than I remember from a few weeks ago. She was seen last week. Noted that her edema was worse and that she now had a left lateral wound as well as deteriorating edema in the medial and posterior part of the lower leg. She says she is using his or her external compression pumps once a day although I wonder about the compliance. 8/25; weeping area on the right medial lower leg. This had actually gotten a small localized area  of her compression stocking wet. oOn the left side there is a large denuded area on the posterior medial lower leg and smaller area on the lateral. This was not the original areas that we dealt with. 9/1 the patient's wound on the left leg include the left lateral and left posterior. Larger superficial wounds weeping. She has very poor edema control. Tender localized edema in the left lower medial ankle/heel probably because of localized wrap issues. She freely admits she is not using the compression pumps. She has been up on her feet a lot. She thinks the hydrofera blue is contributing to the pain she is experiencing.. This is a complaint that I have occasionally heard 9/8; really not much improvement. The patient is still complaining of a lot of pain particularly when she uses compression pumps. I switched her to silver alginate last time because she found the Hydrofera Blue to be irritating. I don't hear much difference in her description with the silver alginate. 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 She has follow-up with Dr. Donzetta Matters I think for ultrasound next month YU, CRAGUN ([id number]) 9/15; quite a bit of improvement today. We have less edema and more epithelialization in both of her wound areas on the left medial and left lateral calf. These are not the site of her original wounds in this area. She says she has been using her compression pumps for 30 minutes twice a day, there pain issues that never quite understood. Silver alginate as the primary dressing 9/22; continued improvement. Both areas medially and laterally still have a small open area there is some eschar. She continues to complain of left medial ankle pain. Swelling in the leg is in much better condition. We have been using silver alginate 9/29; continued improvement. Both areas medially and laterally in the left calf look as though they are close some minor surface eschar but I think this is epithelialized. She comes in today saying she has a ruptured disc at L4-L5 cannot bend over to put on her stockings. 10/6; patient comes in today with no open wounds on either leg. However her edema on the left leg in the upper one third of the lower leg is poorly controlled nonpitting. She says that she could not use the pumps for 2 days and then she has been using the last couple of days. It is not clear to me she has been able to get her stocking on. She has back problems. Mrs. Lukes has severe chronic venous insufficiency with secondary lymphedema. Her venous insufficiency is partially centrally mediated and that she is now post stent in the left common iliac vein. [facility] Thurner's syndrome/physiology]. She follows up with them on 10/15. She wears 20/30 below-knee stockings. She is supposed to use compression pumps at home although I think her compliance about with this is been less than 100%. I have asked her to use these 3 times a day. Finally I think she has lipodermatosclerosis in the left lower leg with an inverted  bottle sign. It is been a major problem controlling the edema in the left leg. The right leg we have had wounds on but not as significant a problem is on the left READMISSION 04/12/2021 Mrs. Wasser is a 76 year old woman we know well in this clinic. She has severe chronic venous insufficiency. 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 I believe she has had bilateral greater saphenous vein ablation in the past as well. She tells me that this wound opened sometime in March. She had a fall and thinks it was initially abrasion. She developed areas she describes as little blisters on the anterior part of her leg and she saw dermatology and was treated for methicillin staph aureus with several rounds of antibiotics. She has been using support stockings on the left leg and says this is the only thing she can get on. Her compression pump use maybe once a day she says if she did not use one she use the other. She comes in today with incredible swelling in the left leg with a wound on the left posterior calf. She has been using Neosporin to this previously a hydrocolloid. 6/15; patient arrives back for 1 week follow-up.Marland Kitchen Apparently her wrap fell down she did not call us to replace this. He has poor edema control. She only uses her compression pumps once a day 6/29; patient presents for 1 week follow-up. She has tolerated the compression wrap well. We have been using Iodoflex under the wrap. She has no issues or complaints today. 7/6; patient presents for 1 week follow-up. She states that the compression wrap rolled down her leg 4 days ago. She has been trying to keep the area covered but has no dressings at home to use. She denies signs of infection. 7/13; patient presents for 1 week follow-up. She states that her compression wrap rolled down her right leg and she called our office and had it placed a few days ago. She has been tolerating the current wrap well. She  states that the Iodoflex is causing a burning sensation. She denies signs of infection. 7/27; patient presents for 1 week follow-up She has tolerated the compression wrap well with Hydrofera Blue underneath. She has now developed a wound to her right lower extremity. She reports having a culture done To this area by her dermatologist that she reports is negative. She currently denies signs of infection. 8/30; patient presents for 1 week follow-up. On the left side she has tolerated the compression wrap well with Hydrofera Blue underneath. She reports some discomfort to her right lower extremity with the 3 layer compression. She currently denies signs of infection. 06/14/2021 upon evaluation today patient's wound actually appears to be doing decently well based on what I am seeing currently. She actually has 2 areas 1 on the left distal/posterior lower leg and the other on the right lower leg. Subsequently the measurements are roughly the same may be slightly smaller but in general have not made any trend towards getting worse which is great news. 06/23/2021 upon evaluation today patient appears to be doing pretty well in regard to her wounds. On the right she is having difficulty with sciatic pain and this subsequently has led to her taking the wrap off over the last week her leg is more swollen she is also been on prednisone for 10 days. With that being said I think we may want to just use a compression sock on the left that way she will not have to worry about the compression wrap being in place that she cannot get off. With that being said I do believe as well that the patient is going to need to continue with the wrapping on the left we will also need to do a little bit of sharp debridement today. 8/24; patient presents for 1 week follow-up. She has no issues  or complaints today. She denies signs of infection. 8/31; patient presents for 1 week follow-up. She has no issues or complaints today. She  has tolerated the compression wrap well on the left lower extremity. She denies signs of infection. 9/7; patient presents for follow-up. She reports that the compression wrap rolled down slightly on her right lower extremity. She reports tenderness to the wound bed. She denies increased warmth or erythema to the surrounding skin. She overall feels well. 9/14; patient presents for follow-up. She has no issues or complaints today. She denies signs of infection. PuraPly is available and patient would like to start this today. 9/21; patient presents for follow-up. She has no issues or complaints today. She tolerated the first skin substitute placement last week under compression. 9/28; patient presents for 1 week follow-up. She has no issues or complaints today. 10/5; patient presents for 1 week follow-up. She reports taking the wrap off yesterday. She was on her feet more yesterday and developed more swelling to her left lower extremity. She denies pain. Overall she is doing well. 10/12; patient presents for 1 week follow-up. She has no issues or complaints today. 10/19; patient presents for follow-up. She has no issues or complaints today. She tolerated the compression wrap well. She states she has compression stockings at home. YIZEL, CANBY ([id number]) Readmission: 11/27/2021 upon evaluation today patient appears to be doing poorly in regard to the wounds on her right leg. She also has an area of erythema in the proximal calf area of the right leg which again I do believe is a issue from the standpoint of it being warm and erythematous to touch and also seems to be somewhat localized I really think this may be more of a cellulitis issue than anything else. Fortunately I do not see any signs of active infection Systemically at this time. Electronic Signature(s) Signed: 11/27/2021 12:42:13 PM By: Worthy Keeler PA-C Entered By: Worthy Keeler on 11/27/2021 12:42:13 [place], [name]  ([id number]) -------------------------------------------------------------------------------- Physical Exam Details Patient Name: MAILA, DUKES. Date of Service: 11/27/2021 11:15 AM Medical Record Number: [id number] Patient Account Number: [id number] Date of Birth/Sex: 09-27-1946 (76 y.o. F) Treating RN: Cornell Barman Primary Care Provider: [name] Other Clinician: Referring Provider: [name] Treating Provider/Extender: [name] in Treatment: 0 Constitutional patient is hypertensive.. pulse regular and within target range for patient.Marland Kitchen respirations regular, non-labored and within target range for patient.Marland Kitchen temperature within target range for patient.. [name] and well-hydrated in no acute distress. Eyes conjunctiva clear no eyelid edema noted. pupils equal round and reactive to light and accommodation. Ears, Nose, Mouth, and Throat no gross abnormality of ear auricles or external auditory canals. normal hearing noted during conversation. mucus membranes moist. Respiratory normal breathing without difficulty. Cardiovascular 1+ dorsalis pedis/posterior tibialis pulses. 2+ pitting edema of the bilateral lower extremities. Musculoskeletal normal gait and posture. no significant deformity or arthritic changes, no loss or range of motion, no clubbing. Psychiatric this patient is able to make decisions and demonstrates good insight into disease process. Alert and Oriented x 3. pleasant and cooperative. Notes Upon inspection patient's wound bed actually showed signs of being superficial but fairly widespread over the medial ankle of the right leg which is somewhat concerning. Her blood pressure is also elevated today which is unfortunate. Nonetheless she tells me at the primary care office this was not nearly as significant previous. Fortunately there does not appear to be any evidence of active infection at this time. Electronic Signature(s) Signed: 11/27/2021  12:42:59 PM By: Worthy Keeler PA-C Entered By: Worthy Keeler on 11/27/2021 12:42:59 [place], [name] ([id number]) -------------------------------------------------------------------------------- Physician Orders Details Patient Name: [name], [name]. Date of Service: 11/27/2021 11:15 AM Medical Record Number: [id number] Patient Account Number: [id number] Date of Birth/Sex: 09-28-1946 (76 y.o. F) Treating RN: Cornell Barman Primary Care Provider: [name] Other Clinician: Referring Provider: [name] Treating Provider/Extender: [name] in Treatment: 0 Verbal / Phone Orders: No Diagnosis Coding ICD-10 Coding Code Description I89.0 Lymphedema, not elsewhere classified I87.331 Chronic venous hypertension (idiopathic) with ulcer and inflammation of right lower extremity L97.811 Non-pressure chronic ulcer of other part of right lower leg limited to breakdown of skin Follow-up Appointments o Return Appointment in 1 week. o Nurse Visit as needed Bathing/ Shower/ Hygiene o May shower with wound dressing protected with water repellent cover or cast protector. Wound Treatment Wound #15 - Lower Leg Wound Laterality: Right, Circumferential Cleanser: Soap and Water Discharge Instructions: Gently cleanse wound with antibacterial soap, rinse and pat dry prior to dressing wounds Primary Dressing: Silvercel 4 1/4x 4 1/4 (in/in) Discharge Instructions: Apply Silvercel 4 1/4x 4 1/4 (in/in) as instructed Secondary Dressing: ABD Pad 5x9 (in/in) Discharge Instructions: Cover with ABD pad Compression Wrap: Unna-Z Zinc and Calamine Boot, 4x10 (in/yd) Discharge Instructions: Use to anchor wrap in place. Apply one strip around leg 3-finger widths below the knee to secure the wrap. Compression Wrap: Profore Lite LF 3 Multilayer Compression Bandaging System Discharge Instructions: Apply 3 multi-layer wrap as prescribed. Laboratory o Bacteria identified in Wound by Culture  (MICRO) - Right leg oooo LOINC Code: 7782-4 oooo Convenience Name: Wound culture routine Patient Medications Allergies: Voltaren, Sulfa (Sulfonamide Antibiotics), latex, Neoprene, doxycycline, oxycodone, tramadol Notifications Medication Indication Start End cefdinir 11/27/2021 DOSE 1 - oral 300 mg capsule - 1 capsule oral taken 2 times per day for 14 days Electronic Signature(s) Signed: 11/27/2021 12:46:11 PM By: Worthy Keeler PA-C Entered By: Worthy Keeler on 11/27/2021 12:46:11 [place], [name] ([id number]) Mcneill, [name] ([id number]) -------------------------------------------------------------------------------- Problem List Details Patient Name: [name], [name]. Date of Service: 11/27/2021 11:15 AM Medical Record Number: [id number] Patient Account Number: [id number] Date of Birth/Sex: May 07, 1946 (76 y.o. F) Treating RN: Cornell Barman Primary Care Provider: [name] Other Clinician: Referring Provider: [name] Treating Provider/Extender: [name] in Treatment: 0 Active Problems ICD-10 Encounter Code Description Active Date MDM Diagnosis I89.0 Lymphedema, not elsewhere classified 11/27/2021 No Yes I87.331 Chronic venous hypertension (idiopathic) with ulcer and inflammation of 11/27/2021 No Yes right lower extremity L97.811 Non-pressure chronic ulcer of other part of right lower leg limited to 11/27/2021 No Yes breakdown of skin Inactive Problems Resolved Problems Electronic Signature(s) Signed: 11/27/2021 12:18:52 PM By: Worthy Keeler PA-C Entered By: Worthy Keeler on 11/27/2021 12:18:52 [place], [name] ([id number]) -------------------------------------------------------------------------------- Progress Note/History and Physical Details Patient Name: BREELLA, VANOSTRAND. Date of Service: 11/27/2021 11:15 AM Medical Record Number: [id number] Patient Account Number: [id number] Date of Birth/Sex: [birth year] (76 y.o. F) Treating RN: Cornell Barman Primary  Care Provider: [name] Other Clinician: Referring Provider: [name] Treating Provider/Extender: [name] in Treatment: 0 Subjective Chief Complaint Information obtained from Patient Right LE Ulcer History of Present Illness (HPI) Pleasant 77 year old with history of chronic venous insufficiency. No diabetes or peripheral vascular disease. Left ABI 1.29. Questionable history of left lower extremity DVT. She developed a recurrent ulceration on her left lateral calf in December 2015, which she attributes to poor diet and subsequent lower extremity edema. She underwent  endovenous laser ablation of her left greater saphenous vein in 2010. She underwent laser ablation of accessory branch of left GSV in April 2016 by Dr. Kellie Simmering at [facility]. She was previously wearing Unna boots, which she tolerated well. Tolerating 2 layer compression and cadexomer iodine. She returns to clinic for follow-up and is without new complaints. She denies any significant pain at this time. She reports persistent pain with pressure. No claudication or ischemic rest pain. No fever or chills. No drainage. READMISSION 11/13/16; this is a 76 year old woman who is not a diabetic. She is here for a review of a painful area on her left medial lower extremity. I note that she was seen here previously last year for wound I believe to be in the same area. At that time she had undergone previously a left greater saphenous vein ablation by Dr. Kellie Simmering and she had a ablation of the anterior accessory branch of the left greater saphenous vein in March 2016. Seeing that the wound actually closed over. In reviewing the history with her today the ulcer in this area has been recurrent. She describes a biopsy of this area in 2009 that only showed stasis physiology. 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 Do not see that she has ever had arterial studies in the left leg. She tells me she has a follow-up with Dr. Kellie Simmering in roughly 10 days In any case she developed the reopening of this area roughly a month ago. On the background of this she describes rapidly increasing edema which has responded to Lasix 40 mg and metolazone 2.5 mg as well as the patient's lymph massage. She has been told she has both venous insufficiency and lymphedema but she cannot tolerate compression stockings 11/28/16; the patient saw Dr. Kellie Simmering recently. Per the patient he did arterial Dopplers in the office that did not show evidence of arterial insufficiency, per the patient he stated "treat this like an ordinary venous ulcer". She also saw her dermatologist Dr. Ronnald Ramp who felt that this was more of a vascular ulcer. In general things are improving although she arrives today with increasing bilateral lower extremity edema with weeping a deeper fluid through the wound on the left medial leg compatible with some degree of lymphedema 12/04/16; the patient's wound is fully epithelialized but I don't think fully healed. We will do another week of depression with Promogran and TCA however I suspect we'll be able to discharge her next week. This is a very unusual-looking wound which was initially a figure-of-eight type wound lying on its side surrounded by petechial like hemorrhage. She has had venous ablation on this side. She apparently does not have an arterial issue per Dr. Kellie Simmering. She saw her dermatologist thought it was "vascular". Patient is definitely going to need ongoing compression and I talked about this with her today she will go to elastic therapy after she leaves here next week 12/11/16; the patient's wound is not completely closed today. She has surrounding scar tissue and in further discussion with the patient it  would appear that she had ulcers in this area in 2009 for a prolonged period of time ultimately requiring a punch biopsy of this area that only showed venous insufficiency. I did not previously pickup on this part of  the history from the patient. 12/18/16; the patient's wound is completely epithelialized. There is no open area here. She has significant bilateral venous insufficiency with secondary lymphedema to a mild-to-moderate degree she does not have compression stockings.. She did not say anything to me when I was in the room, she told our intake nurse that she was still having pain in this area. This isn't unusual recurrent small open area. She is going to go to elastic therapy to obtain compression stockings. 12/25/16; the patient's wound is fully epithelialized. There is no open area here. The patient describes some continued episodic discomfort in this area medial left calf. However everything looks fine and healed here. She is been to elastic therapy and caught herself 15-20 mmHg stockings, they apparently were having trouble getting 20-30 mm stockings in her size 01/22/17; this is a patient we discharged from the clinic a month ago. She has a recurrent open wound on her medial left calf. She had 15 mm support stockings. I told her I thought she needed 20-30 mm compression stockings. She tells me that she has been ill with hospitalization secondary to asthma and is been found to have severe hypokalemia likely secondary to a combination of Lasix and metolazone. This morning she noted blistering and leaking fluid on the posterior part of her left leg. She called our intake nurse urgently and we was saw her this afternoon. She has not had any real discomfort here. I don't know that she's been wearing any stockings on this leg for at least 2-3 days. ABIs in this clinic were 1.21 on the right and 1.3 on the left. She is previously seen vascular surgery who does not think that there is a peripheral  arterial issue. 01/30/17; Patient arrives with no open wound on the left leg. She has been to elastic therapy and obtained 20-53mmhg below knee stockings and she has one on the right leg today. READMISSION 02/19/18; this [place] is a now 76 year old patient we've had in this clinic perhaps 3 times before. I had last looked at her from January 07 December 2016 with an area on the medial left leg. We discharged her on 12/25/16 however she had to be readmitted on 01/22/17 with a recurrence. I have in my notes that we discharged her on 20-30 mm stockings although she tells me she was only wearing support hose because she cannot get stockings on predominantly related to her cervical spine surgery/issues. She has had previous ablations done by vein and vascular in [place] including a great saphenous vein ablation on the left with an anterior accessory branch ablation I think both of these were in 2016. On one of the previous visit she had a biopsy noted 2009 that was negative. She is not felt to have an arterial issue. She is not a diabetic. She does have a history of obstructive sleep apnea hypertension asthma as well as chronic venous insufficiency and lymphedema. [name], [name] ([id number]) On this occasion she noted 2 dry scaly patch on her left leg. She tried to put lotion on this it didn't really help. There were 2 open areas.the patient has been seeing her primary physician from 02/05/18 through 02/14/18. She had Unna boots applied. The superior wound now on the lateral left leg has closed but she's had one wound that remains open on the lateral left leg. This is not the same spot as we dealt with in 2018. ABIs in this clinic were 1.3 bilaterally 02/26/18; patient has a small wound on  the left lateral calf. Dimensions are down. She has chronic venous insufficiency and lymphedema. 03/05/18; small open area on the left lateral calf. Dimensions are down. Tightly adherent necrotic debris over the surface of  the wound which was difficult to remove. Also the dressing [over collagen] stuck to the wound surface. This was removed with some difficulty as well. Change the primary dressing to Hydrofera Blue ready 03/12/18; small open area on the left lateral calf. Comes in with tightly adherent surface eschar as well as some adherent Hydrofera Blue. 03/19/18; open area on the left lateral calf. Again adherent surface eschar as well as some adherent Hydrofera Blue nonviable subcutaneous tissue. She complained of pain all week even with the reduction from 4-3 layer compression I put on last week. Also she had an increase in her ankle and calf measurements probably related to the same thing. 03/26/18; open area on the left lateral calf. A very small open area remains here. We used silver alginate starting last week as the Hydrofera Blue seem to stick to the wound bed. In using 4-layer compression 04/02/18; the open area in the left lateral calf at some adherent slough which I removed there is no open area here. We are able to transition her into her own compression stocking. Truthfully I think this is probably his support hose. However this does not maintain skin integrity will be limited. She cannot put over the toe compression stockings on because of neck problems hand problems etc. She is allergic to the lining layer of juxta lites. We might be forced to use extremitease stocking should this fail READMIT 11/24/2018 Patient is now a 76 year old woman who is not a diabetic. She has been in this clinic on at least 3 previous occasions largely with recurrent wounds on her left leg secondary to chronic venous insufficiency with secondary lymphedema. Her situation is complicated by inability to get stockings on and an allergy to neoprene which is apparently a component and at least juxta lites and other stockings. As a result she really has not been wearing any stockings on her legs. She tells [place] that roughly 2 or 3  weeks ago she started noticing a stinging sensation just above her ankle on the left medial aspect. She has been diagnosed with pseudogout and she wondered whether this was what she was experiencing. She tried to dress this with something she bought at the store however subsequently it pulled skin off and now she has an open wound that is not improving. She has been using Vaseline gauze with a cover bandage. She saw her primary doctor last week who put an Haematologist on her. ABIs in this clinic was 1.03 on the left 2/12; the area is on the left medial ankle. Odd-looking wound with what looks to be surface epithelialization but a multitude of small petechial openings. This clearly not closed yet. We have been using silver alginate under 3 layer compression with TCA 2/19; the wound area did not look quite as good this week. Necrotic debris over the majority of the wound surface which required debridement. She continues to have a multitude of what looked to be small petechial openings. She reminds [place] that she had a biopsy on this initially during her first outbreak in 2015 in [place] [facility]. She expresses concern about this being a possible melanoma. 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 I assured her that this does not look  anything like melanoma. Except for the petechial reaction it does look like a venous insufficiency area and she certainly has evidence of this on both sides 2/26; a difficult area on the left medial ankle. The patient clearly has chronic venous hypertension with some degree of lymphedema. The odd thing about the area is the small petechial hemorrhages. I am not really sure how to explain this. This was present last time and this is not a compression injury. We have been using Hydrofera Blue which I changed to last week 3/4; still using Hydrofera Blue. Aggressive debridement today. She  does not have known arterial issues. She has seen Dr. Kellie Simmering at [facility] vein [facility] vascular and and has an ablation on the left. [Anterior accessory branch of the greater saphenous]. From what I remember they did not feel she had an arterial issue. The patient has had this area biopsied in 2009 at [facility] dermatology and by her recollection they said this was "stasis". She is also follow-up with dermatology locally who thought that this was more of a vascular issue 3/11; using Hydrofera Blue. Aggressive debridement today. She does not have an arterial issue. We are using 3 layer compression although we may need to go to 4. The patient has been in for multiple changes to her wrap since I last saw her a week ago. She says that the area was leaking. I do not have too much more information on what was found 01/19/19 on evaluation today patient was actually being seen for a nurse visit when unfortunately she had the area on her left lateral lower extremity as well as weeping from the right lower extremity that became apparent. Therefore we did end up actually seeing her for a full visit with myself. She is having some pain at this site as well but fortunately nothing too significant at this point. No fevers, chills, nausea, or vomiting noted at this time. 3/18-Patient is back to the clinic with the left leg venous leg ulcer, the ulcer is larger in size, has a surface that is densely adherent with fibrinous tissue, the Hydrofera Blue was used but is densely adherent and there was difficulty in removing it. The right lower extremity was also wrapped for weeping edema. Patient has a new area over the left lateral foot above the malleolus that is small and appears to have no debris with intact surrounding skin. Patient is on increased dose of Lasix also as a means to edema management 3/25; the patient has a nonhealing venous ulcer on the medial left leg and last week developed a smaller area on the lateral  left calf. We have been using Hydrofera Blue with a contact layer. 4/1; no major change in these wounds areas. Left medial and more recently left lateral calf. I tried Iodoflex last week to aid in debridement she did not tolerate this. She stated her pain was terrible all week. She took the top layer of the 4 layer compression off. 4/8; the patient actually looks somewhat better in terms of her more prominent left lateral calf wound. There is some healthy looking tissue here. She is still complaining of a lot of discomfort. 4/15; patient in a lot of pain secondary to sciatica. She is on a prednisone taper prescribed by her primary physician. She has the 2 areas one on the left medial and more recently a smaller area on the left lateral calf. Both of these just above the malleoli 4/22; her back pain is better but she still states  she is very uncomfortable and now feels she is intolerant to the [employer]. No real change in the wounds we have been using Sorbact. She has been previously intolerant to Iodoflex. There is not a lot of option about what we can use to debride this wound under compression that she no doubt needs. sHe states Ultram no longer works for her pain 4/29; no major change in the wounds slightly increased depth. Surface on the original medial wound perhaps somewhat improved however the more recent area on the lateral left ankle is 100% covered in very adherent debris we have been using Sorbact. She tolerates 4 layer compression well and her edema control is a lot better. She has not had to come in for a nurse check 5/6; no major change in the condition of the wounds. She did consent to debridement today which was done with some difficulty. Continuing Sorbact. She did not tolerate Iodoflex. She was in for a check of her compression the day after we wrapped her last week this was adjusted but nothing much was found 5/13; no major change in the condition or area of the wounds. I was able  to get a fairly aggressive debridement done on the lateral left leg [name], [name] ([id number]) wound. Even using Sorbact under compression. She came back in on Friday to have the wrap changed. She says she felt uncomfortable on the lateral aspect of her ankle. She has a long history of chronic venous insufficiency including previous ablation surgery on this side. 5/20-Patient returns for wounds on left leg with both wounds covered in slough, with the lateral leg wound larger in size, she has been in 3 layer compression and felt more comfortable, she describes pain in ankle, in leg and pins and needles in foot, and is about to try Pamelor for this 6/3; wounds on the left lateral and left medial leg. The area medially which is the most recent of the 2 seems to have had the largest increase in dimensions. We have been using Sorbac to try and debride the surface. She has been to see orthopedics they apparently did a plain x-ray that was indeterminant. Diagnosed her with neuropathy and they have ordered an MRI to determine if there is underlying osteomyelitis. This was not high on my thought list but I suppose it is prudent. We have advised her to make an appointment with vein and vascular in [place]. She has a history of a left greater saphenous and accessory vein ablations I wonder if there is anything else that can be done from a surgical point of view to help in these difficult refractory wounds. We have previously healed this wound on one occasion but it keeps on reopening [medial side] 6/10; deep tissue culture I did last week I think on the left medial wound showed both moderate E. coli and moderate staph aureus [MSSA]. She is going to require antibiotics and I have chosen Augmentin. We have been using Sorbact and we have made better looking wound surface on both sides but certainly no improvement in wound area. She was back in last Friday apparently for a dressing changes the wrap was hurting  her outer left ankle. She has not managed to get a hold of vein and vascular in [place]. We are going to have to make her that appointment 6/17; patient is tolerating the Augmentin. She had an MRI that I think was ordered by orthopedic surgeon this did not show osteomyelitis or an abscess did suggest cellulitis.  We have been using Sorbact to the lateral and medial ankles. We have been trying to arrange a follow-up appointment with vein and vascular in [place] or did her original ablations. We apparently an area sent the request to vein and vascular in [facility] 6/24; patient has completed the Augmentin. We do not yet have a vein and vascular appointment in [place]. I am not sure what the issue is here we have asked her to call tomorrow. We are using Sorbact. Making some improvements and especially the medial wound. Both surfaces however look better medial and lateral. 7/1; the patient has been in contact with vein and vascular in [place] but has not yet received an appointment. Using Sorbact we have gradually improve the wound surface with no improvement in surface area. She is approved for Apligraf but the wound surface still is not completely viable. She has not had to come in for a dressing change 7/8; the patient has an appointment with vein and vascular on 7/31 which is a Friday afternoon. She is concerned about getting back here for [place] to dress her wounds. I think it is important to have them goal for her venous reflux/history of ablations etc. to see if anything else can be done. She apparently tested positive for 1 of the blood tests with regards to lupus and saw a rheumatologist. He has raised the issue of vasculitis again. I have had this thought in the past however the evidence seems overwhelming that this is a venous reflux etiology. If the rheumatologist tells me there is clinical and laboratory investigation is positive for lupus I will rethink this. 7/15; the patient's  wound surfaces are quite a bit better. The medial area which was her original wound now has no depth although the lateral wound which was the more recent area actually appears larger. Both with viable surfaces which is indeed better. Using Sorbact. I wanted to use Apligraf on her however there is the issue of the vein and vascular appointment on 7/31 at 2:00 in the afternoon which would not allow her to get back to be rewrapped and they would no doubt remove the graft 7/22; the patient's wound surfaces have moderate amount of debris although generally look better. The lateral one is larger with 2 small satellite areas superiorly. We are waiting for her vein and vascular appointment on 7/31. She has been approved for Apligraf which I would like to use after th 7/29; wound surfaces have improved no debridement is required we have been using Sorbact. She sees vein and vascular on Friday with this so question of whether anything can be done to lessen the likelihood of recurrence and/or speed the healing of these areas. She is already had previous ablations. She no doubt has severe venous hypertension 8/5-Patient returns at 1 week, she was in [place] for 3 days by her podiatrist, we have been using so backed to the wound, she has increased pain in both the wounds on the left lower leg especially the more distal one on the lateral aspect 8/12-Patient returns at 1 week and she is agreeable to having debridement in both wounds on her left leg today. We have been using Sorbact, and vascular studies were reviewed at last visit 8/19; the patient arrives with her wounds fairly clean and no debridement is required. We have used Sorbact which is really done a nice job in cleaning up these very difficult wound surfaces. The patient saw Dr. Donzetta Matters of vascular surgery on 7/31. He did not  feel that there was an arterial component. He felt that her treated greater saphenous vein is adequately addressed and that the  small saphenous vein did not appear to be involved significantly. She was also noted to have deep venous reflux which is not treatable. Dr. Donzetta Matters mentioned the possibility of a central obstructive component leading to reflux and he offered her central venography. She wanted to discuss this or think about it. I have urged her to go ahead with this. She has had recurrent difficult wounds in these areas which do heal but after months in the clinic. If there is anything that can be done to reduce the likelihood of this I think it is worth it. 9/2 she is still working towards getting follow-up with Dr. Donzetta Matters to schedule her CT. Things are quite a bit worse venography. I put Apligraf on 2 weeks ago on both wounds on the medial and lateral part of her left lower leg. She arrives in clinic today with 3 superficial additional wounds above the area laterally and one below the wound medially. She describes a lot of discomfort. I think these are probably wrapped injuries. Does not look like she has cellulitis. 07/20/2019 on evaluation today patient appears to be doing somewhat poorly in regard to her lower extremity ulcers. She in fact showed signs of erythema in fact we may even be dealing with an infection at this time. Unfortunately I am unsure if this is just infection or if indeed there may be some allergic reaction that occurred as a result of the Apligraf application. With that being said that would be unusual but nonetheless not impossible in this patient is one who is unfortunately allergic to quite a bit. Currently we have been using the Sorbact which seems to do as well as anything for her. I do think we may want to obtain a culture today to see if there is anything showing up there that may need to be addressed. 9/16; noted that last week the wounds look worse in 1 week follow-up of the Apligraf. Using Sorbact as of 2 days ago. She arrives with copious amounts of drainage and new skin breakdown on the  back of the left calf. The wounds arm more substantial bilaterally. There is a fair amount of swelling in the left calf no overt DVT there is edema present I think in the left greater than right thigh. She is supposed to go on 9/28 for CT venography. The wounds on the medial and lateral calf are worse and she has new skin breakdown posteriorly at least new for me. This is almost developing into a circumferential wound area The Apligraf was taken off last week which I agree with things are not going in the right direction a culture was done we do not have that back yet. She is on Augmentin that she started 2 days ago 9/23; dressing was changed by her nurses on Monday. In general there is no improvement in the wound areas although the area looks less angry than last week. She did get Augmentin for MSSA cultured on the 14th. She still appears to have too much swelling in the left leg even with 3 layer compression 9/30; the patient underwent her procedure on 9/28 by Dr. Donzetta Matters at vascular and vein specialist. She was discovered to have the common iliac vein measuring 12.2 mm but at the level of L4-L5 measured 3 mm. After stenting it measured 10 mm. 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  Rouleaux flow in the common femoral and femoral vein was observed much improved after stenting. We are using silver alginate to the wounds on the medial and lateral ankle on the left. 4 layer compression 10/7; the patient had fluid swelling around her knee and 4 layer compression. At the advice of vein and vascular this was reduced to 3 layer which she is tolerating better. We have been using silver alginate under 3 layer compression since last Friday [name], [name] ([id number]) 10/14; arrives with the areas on the left ankle looking a lot better. Inflammation in the area also a lot better. She came in for a nurse check on 10/9 10/21; continued nice improvement. Slight improvements in surface area of  both the medial and lateral wounds on the left. A lot of the satellite lesions in the weeping erythema around these from stasis dermatitis is resolved. We have been using silver alginate 10/28; general improvement in the entire wound areas although not a lot of change in dimensions the wound certainly looks better. There is a lot less in terms of venous inflammation. Continue silver alginate this week however look towards Hydrofera Blue next week 11/4; very adherent debris on the medial wound left wound is not as bad. We have been using silver alginate. Change to [facility] today 11/11; very adherent debris on both wound areas. She went to vein and vascular last week and follow-up they put in [place] boot on this today. He says the [facility] was adherent. Wound is definitely not as good as last week. Especially on the left there the satellite lesions look more prominent 11/18; absolutely no better. erythema on lateral aspect with tenderness. 09/30/2019 on evaluation today patient appears to actually be doing better. Dr. Dellia Nims did put her on doxycycline last week which I do believe has helped her at this point. Fortunately there is no signs of active infection at this time. No fevers, chills, nausea, vomiting, or diarrhea. I do believe he may want extend the doxycycline for 7 additional days just to ensure everything does completely cleared up the patient is in agreement with that plan. Otherwise she is going require some sharp debridement today 12/2; patient is completing a 2-week course of doxycycline. I gave her this empirically for inflammation as well as infection when I last saw her 2 weeks ago. All of this seems to be better. She is using silver alginate she has the area on the medial aspect of the larger area laterally and the 2 small satellite regions laterally above the major wound. 12/9; the patient's wound on the left medial and left lateral calf look really quite good. We have been  using silver alginate. She saw vein and vascular in follow-up on 10/09/2019. She has had a previous left greater saphenous vein ablation by Dr. Oscar La in 2016. More recently she underwent a left common iliac vein stent by Dr. 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 The swelling is improved and certainly the wounds have improved. The patient shows [place] today area on the right medial calf there is almost no wound but leaking lymphedema. She says she start this started 3 or 4 days ago. She did not traumatize it. It is not painful. She does not wear compression on that side 12/16; the patient continues to do well laterally. Medially still requiring debridement. The area on the right calf did not materialize to anything and is not currently open. We wrapped this last time. She has support stockings for  that leg although I am not sure they are going to provide adequate compression 12/23; the lateral wound looks stable. Medially still requiring debridement for tightly adherent fibrinous debris. We've been using silver alginate. Surface area not any different 12/30; neither wound is any better with regards to surface and the area on the left lateral is larger. I been using silver alginate to the left lateral which look quite good last week and Sorbact to the left medial 11/11/2019. Lateral wound area actually looks better and somewhat smaller. Medial still requires a very aggressive debridement today. We have been using Sorbact on both wound areas 1/13; not much better still adherent debris bilaterally. I been using Sorbact. She has severe venous hypertension. Probably some degree of dermal fibrosis distally. I wonder whether tighter compression might help and I am going to try that today. We also need to work on the bioburden 1/20; using Sorbact. She has severe venous hypertension status post stent placement for pelvic vein compression. We applied gentamicin last time to see if we could reduce  bioburden I had some discussion with her today about the use of pentoxifylline. This is occasionally used in this setting for wounds with refractory venous insufficiency. However this interacts with Plavix. She tells me that she was put on this after stent placement for 3 months. She will call Dr. Claretha Cooper office to discuss 1/27; we are using gentamicin under Sorbact. 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 She has a stent. Wound medially is measuring smaller this week. Laterally measuring slightly larger although she has some satellite lesions superiorly 2/3; gentamicin under Sorbact under 4-layer compression. 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 She has a stent on Plavix. Her wounds are measuring smaller this week. More substantially laterally where there is a satellite lesion superiorly. 2/10; gentamicin under Sorbac. 4-layer compression. Patient communicated with Dr. Donzetta Matters at vein and vascular in [place]. He is okay with the patient coming off Plavix I will therefore start her on pentoxifylline for a 1 month trial. In general her wounds look better today. I had some concerns about swelling in the left thigh however she measures 61.5 on the right and 63 on the mid thigh which does not suggest there is any difficulty. The patient is not describing any pain. 2/17; gentamicin under Sorbac 4-layer compression. She has been on pentoxifylline for 1 week and complains of loose stool. No nausea she is eating and drinking well 2/24; the patient apparently came in 2 days ago for a nurse visit when her wrap fell down. Both areas look a little worse this week macerated medially and satellite lesions laterally. Change to silver alginate today 3/3; wounds are larger today especially medially. She also has more swelling in her foot lower leg and I even noted some swelling in her posterior thigh which is tender. I wonder about the patency of her stent.  Fortuitously she sees Dr. Claretha Cooper group on Friday 3/10; Mrs. Pottenger was seen by vein and vascular on 3/5. The patient underwent ultrasound. There was no evidence of thrombosis involving the IVC no evidence of thrombosis involving the right common iliac vein there is no evidence of thrombosis involving the right external iliac vein the left external vein is also patent. The right common iliac vein stent appears patent bilateral common femoral veins are compressible and appear patent. I was concerned about the left common iliac stent however it looks like this is functional. She has some edema in the posterior thigh  that was tender she still has that this week. I also note they had trouble finding the pulses in her left foot and booked her for an ABI baseline in 4 weeks. She will follow up in 6 months for repeat IVC duplex. The patient stopped the pentoxifylline because of diarrhea. It does not look like that was being effective in any case. I have advised her to go back on her aspirin 81 mg tablet, vascular it also suggested this 3/17; comes in today with her wound surfaces a lot better. The excoriations from last week considerably better probably secondary to the TCA. We have been using silver alginate 3/24; comes in today with smaller wounds both medially and laterally. Both required debridement. There are 2 small satellite areas superiorly laterally. She also has a very odd bandlike area in the mid calf almost looking like there was a weakness in the wrap in a localized area. I would write this off as being this however anteriorly she has a small raised ballotable area that is very tender almost reminiscent of an abscess but there was no obvious purulent surface to it. [name], [name] ([id number]) [birth year]/20 upon evaluation today patient appears to be doing fairly well in regard to her wounds today. Fortunately there is no signs of active infection at this time. No fevers, chills, nausea, vomiting,  or diarrhea. She has been tolerating the dressing changes without complication. Fortunately I feel like she is showing signs of improvement although has been sometime since have seen her. Nonetheless the area of concern that Dr. Dellia Nims had last week where she had possibly an area of the wrap that was we can allow the leg to bulge appears to be doing significantly better today there is no signs of anything worsening. 4/7; the patient's wounds on her medial and lateral left leg continue to contract. We have been using a regular alginate. Last week she developed an area on the right medial lower leg which is probably a venous ulcer as well. 4/14; the wounds on her left medial and lateral lower leg continue to contract. Surface eschar. We have been using regular alginate. The area on the right medial lower leg is closed. We have been putting both legs under 4-layer contraction. The patient went back to see vein and vascular she had arterial studies done which were apparently "quite good" per the patient although I have not read their notes I have never felt she had an arterial issue. 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 This is been longstanding and refractory to exercise, leg elevation and longstanding use of compression wraps in our clinic as well as compression stockings on the times we have been able to get these to heal 4/21; we thought she actually might be close this week however she arrives in clinic with a lot of edema in her upper left calf and into her posterior thigh. This is been an intermittent problem here. She says the wrap fell down but it was replaced with a nurse visit on Monday. We are using calcium alginate to the wounds and the wound sizes there not terribly larger than last week but there is a lot more edema 4/28; again wound edges are smaller on both sides. Her edema is better controlled than last time. She is obtained her  compression pumps from [employer] although they have not been to her home to set these up. 5/5; left medial and left lateral both look stable. I am not  sure the medial is any smaller. We have been using calcium alginate under 4-layer compression. She had an area on the right medial. This was eschared today. We have been wrapping this as well. She does not tolerate external compression stockings due to a history of various contact allergies. She has her compression pumps however the representative from the company is coming on her to show her how to use these tomorrow 5/19; patient with severe chronic venous insufficiency secondary to central venous disease. She had a stent placed in her left common iliac vein. She has done better since but still difficult to control wounds. She comes in today with nothing open on the right leg. Her areas on the left medial and left lateral are just about closed. We are using calcium alginate under 4-layer compression. She is using her external compression pumps at home She only has 15-20 support stockings. States she cannot get anything tighter than that on. 03/30/20-Patient returns at 1 week, the wounds on the left leg are both slightly bigger, the last week she was on 3 layer compression which started to slide down. She is starting to use her lymphedema pumps although she stated on 1 day her right ankle started to swell up and she have to stop that day. Unfortunately the open area seem to oscillate between improving to the point of healing and then flaring up all to do with effectiveness of compression or lack of due to the left leg topography not keeping the compression wraps from rolling down 6/2 patient comes in with a 15/20 mmHg stocking on the right leg. She tells me that she developed a lot of swelling in her ankles she saw orthopedics she was felt to possibly be having a flare of pseudogout versus some other type of arthritis. She was put on steroids  for a respiratory issue so that helps with the inflammation. She has not been using the pumps all week. She thinks the left thigh is more swollen than usual and I would agree with that. She has an appointment with Dr. Donzetta Matters 9 days or so from now 6/9; both wounds on the left medial and left lateral are smaller. We have been using calcium alginate under compression. She does not have an open wound on the right leg she is using a stocking and her compression pumps things are going well. She has an appointment with Dr. Donzetta Matters with regards to her stent in the left common iliac vein 6/16; the wounds on the left medial and left lateral ankle continues to contract. The patient saw Dr. Donzetta Matters and I think he seems satisfied. Ordered follow-up venous reflux studies on both sides in September. Cautioned that she may need thigh-high stockings. She has been using calcium alginate under compression on the left and her own stocking on the right leg. She tells [place] there are no open wounds on the right 6/23; left lateral is just about closed. Medial required debridement today. We have been using calcium alginate. Extensive discussion about the compression pumps she is only using these on 25 mmHg states she could not take 40 or 30 when the wrap came out to her home to demonstrate these. He said they should not feel tight 6/30; the left lateral wound has a slight amount of eschar. . The area medially is about the same using Hydrofera Blue. 7/7; left lateral wound still has some eschar. I will remove this next week may be closed. The area medially is very small using Hydrofera Blue  with improvement. Unfortunately the stockings fell down. Unfortunately the blisters have developed at the edge of where the wrap fell. When this happened she says her legs hurt she did not use her pumps. We are not open Monday for her to come in and change the wraps and she had an appointment yesterday. She also tells me that she is going to have  an MRI of her back. She is having pain radiating into her left anterior leg she thinks her from an L5 disc. She saw Dr. Ellene Route of neurosurgery 7/14; the area on the left lateral ankle area is closed. Still a small area medially however it looks better as well. We have been using Hydrofera Blue under 4-layer compression 7/21; left lateral ankle is still closed however her wound on the medial left calf is actually larger. This is probably because Hydrofera Blue got stuck to the wound. She came in for a nurse change on Friday and will do that again this week I was concerned about the amount of swelling that she had last week however she is using her compression pumps twice a day and the swelling seems well controlled 7/28; remaining wound on the left medial lower leg is smaller. We have been using moistened silver collagen under compression she is coming back for a nurse visit. For reasons that were not really clear she was just keeping her legs elevated and not using her compression pumps. I have asked her to use the compression pumps. She does not have any wounds on the right leg 06/15/20-Patient returns at 2 weeks, her LLE edema is worse and she developed a blister wound that is new and has bigger posterior calf wound on right, we are using Prisma with pad, 4 layer compression. she has been on lasix 40 mg daily 8/18; patient arrives today with things a lot worse than I remember from a few weeks ago. She was seen last week. Noted that her edema was worse and that she now had a left lateral wound as well as deteriorating edema in the medial and posterior part of the lower leg. She says she is using his or her external compression pumps once a day although I wonder about the compliance. 8/25; weeping area on the right medial lower leg. This had actually gotten a small localized area of her compression stocking wet. On the left side there is a large denuded area on the posterior medial lower leg and  smaller area on the lateral. This was not the original areas that we dealt with. 9/1 the patient's wound on the left leg include the left lateral and left posterior. Larger superficial wounds weeping. She has very poor edema control. Tender localized edema in the left lower medial ankle/heel probably because of localized wrap issues. She freely admits she is not using the compression pumps. She has been up on her feet a lot. She thinks the hydrofera blue is contributing to the pain she is experiencing.. This is a complaint that I have occasionally heard [name], [name] ([id number]) 9/8; really not much improvement. The patient is still complaining of a lot of pain particularly when she uses compression pumps. I switched her to silver alginate last time because she found the Hydrofera Blue to be irritating. I don't hear much difference in her description with the silver alginate. 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 She has follow-up with Dr. Donzetta Matters  I think for ultrasound next month 9/15; quite a bit of improvement today. We have less edema and more epithelialization in both of her wound areas on the left medial and left lateral calf. These are not the site of her original wounds in this area. She says she has been using her compression pumps for 30 minutes twice a day, there pain issues that never quite understood. Silver alginate as the primary dressing 9/22; continued improvement. Both areas medially and laterally still have a small open area there is some eschar. She continues to complain of left medial ankle pain. Swelling in the leg is in much better condition. We have been using silver alginate 9/29; continued improvement. Both areas medially and laterally in the left calf look as though they are close some minor surface eschar but I think this is epithelialized. She comes in today saying she has a ruptured disc  at L4-L5 cannot bend over to put on her stockings. 10/6; patient comes in today with no open wounds on either leg. However her edema on the left leg in the upper one third of the lower leg is poorly controlled nonpitting. She says that she could not use the pumps for 2 days and then she has been using the last couple of days. It is not clear to me she has been able to get her stocking on. She has back problems. Mrs. Loth has severe chronic venous insufficiency with secondary lymphedema. Her venous insufficiency is partially centrally mediated and that she is now post stent in the left common iliac vein. [facility] Thurner's syndrome/physiology]. She follows up with them on 10/15. She wears 20/30 below-knee stockings. She is supposed to use compression pumps at home although I think her compliance about with this is been less than 100%. I have asked her to use these 3 times a day. Finally I think she has lipodermatosclerosis in the left lower leg with an inverted bottle sign. It is been a major problem controlling the edema in the left leg. The right leg we have had wounds on but not as significant a problem is on the left READMISSION 04/12/2021 Mrs. Shackleton is a 76 year old woman we know well in this clinic. She has severe chronic venous insufficiency. 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 I believe she has had bilateral greater saphenous vein ablation in the past as well. She tells me that this wound opened sometime in March. She had a fall and thinks it was initially abrasion. She developed areas she describes as little blisters on the anterior part of her leg and she saw dermatology and was treated for methicillin staph aureus with several rounds of antibiotics. She has been using support stockings on the left leg and says this is the only thing she can get on. Her compression pump use maybe once a day she says if she did not use one she use the other. She  comes in today with incredible swelling in the left leg with a wound on the left posterior calf. She has been using Neosporin to this previously a hydrocolloid. 6/15; patient arrives back for 1 week follow-up.Marland Kitchen Apparently her wrap fell down she did not call us to replace this. He has poor edema control. She only uses her compression pumps once a day 6/29; patient presents for 1 week follow-up. She has tolerated the compression wrap well. We have been using Iodoflex under the wrap. She has no issues or complaints  today. 7/6; patient presents for 1 week follow-up. She states that the compression wrap rolled down her leg 4 days ago. She has been trying to keep the area covered but has no dressings at home to use. She denies signs of infection. 7/13; patient presents for 1 week follow-up. She states that her compression wrap rolled down her right leg and she called our office and had it placed a few days ago. She has been tolerating the current wrap well. She states that the Iodoflex is causing a burning sensation. She denies signs of infection. 7/27; patient presents for 1 week follow-up She has tolerated the compression wrap well with Hydrofera Blue underneath. She has now developed a wound to her right lower extremity. She reports having a culture done To this area by her dermatologist that she reports is negative. She currently denies signs of infection. 8/30; patient presents for 1 week follow-up. On the left side she has tolerated the compression wrap well with Hydrofera Blue underneath. She reports some discomfort to her right lower extremity with the 3 layer compression. She currently denies signs of infection. 06/14/2021 upon evaluation today patient's wound actually appears to be doing decently well based on what I am seeing currently. She actually has 2 areas 1 on the left distal/posterior lower leg and the other on the right lower leg. Subsequently the measurements are roughly the same may  be slightly smaller but in general have not made any trend towards getting worse which is great news. 06/23/2021 upon evaluation today patient appears to be doing pretty well in regard to her wounds. On the right she is having difficulty with sciatic pain and this subsequently has led to her taking the wrap off over the last week her leg is more swollen she is also been on prednisone for 10 days. With that being said I think we may want to just use a compression sock on the left that way she will not have to worry about the compression wrap being in place that she cannot get off. With that being said I do believe as well that the patient is going to need to continue with the wrapping on the left we will also need to do a little bit of sharp debridement today. 8/24; patient presents for 1 week follow-up. She has no issues or complaints today. She denies signs of infection. 8/31; patient presents for 1 week follow-up. She has no issues or complaints today. She has tolerated the compression wrap well on the left lower extremity. She denies signs of infection. 9/7; patient presents for follow-up. She reports that the compression wrap rolled down slightly on her right lower extremity. She reports tenderness to the wound bed. She denies increased warmth or erythema to the surrounding skin. She overall feels well. 9/14; patient presents for follow-up. She has no issues or complaints today. She denies signs of infection. PuraPly is available and patient would like to start this today. 9/21; patient presents for follow-up. She has no issues or complaints today. She tolerated the first skin substitute placement last week under compression. 9/28; patient presents for 1 week follow-up. She has no issues or complaints today. 10/5; patient presents for 1 week follow-up. She reports taking the wrap off yesterday. She was on her feet more yesterday and developed more swelling to her left lower extremity. She denies  pain. Overall she is doing well. TORA, PRUNTY ([id number]) 10/12; patient presents for 1 week follow-up. She has no issues or complaints today.  10/19; patient presents for follow-up. She has no issues or complaints today. She tolerated the compression wrap well. She states she has compression stockings at home. Readmission: 11/27/2021 upon evaluation today patient appears to be doing poorly in regard to the wounds on her right leg. She also has an area of erythema in the proximal calf area of the right leg which again I do believe is a issue from the standpoint of it being warm and erythematous to touch and also seems to be somewhat localized I really think this may be more of a cellulitis issue than anything else. Fortunately I do not see any signs of active infection Systemically at this time. Patient History Information obtained from Patient. Allergies Sulfa (Sulfonamide Antibiotics) (Reaction: facial swelling), latex, Neoprene, Voltaren (Severity: Moderate, Reaction: wheezing asthma symptoms), doxycycline, oxycodone (Reaction: brain fog), tramadol (Reaction: brain fog) Family History Cancer - Mother,Paternal Grandparents, Hypertension - Mother,Father, Kidney Disease - Paternal Grandparents, Lung Disease - Paternal Grandparents, No family history of Diabetes, Heart Disease, Hereditary Spherocytosis, Seizures, Stroke, Thyroid Problems, Tuberculosis. Social History Former smoker - quit at age 79 - ended on 04/19/1966, Marital Status - Divorced, Alcohol Use - Never, Drug Use - No History, Caffeine Use - Daily. Medical History Eyes Patient has history of Cataracts Denies history of Glaucoma, Optic Neuritis Ear/Nose/Mouth/Throat Denies history of Chronic sinus problems/congestion, Middle ear problems Hematologic/Lymphatic Denies history of Anemia, Hemophilia, Human Immunodeficiency Virus, Lymphedema, Sickle Cell Disease Respiratory Patient has history of Asthma, Sleep Apnea Denies  history of Aspiration, Chronic Obstructive Pulmonary Disease (COPD), Pneumothorax, Tuberculosis Cardiovascular Patient has history of Deep Vein Thrombosis - LLE 2010, Hypertension, Peripheral Venous Disease Denies history of Angina, Arrhythmia, Congestive Heart Failure, Coronary Artery Disease, Hypotension, Myocardial Infarction, Peripheral Arterial Disease, Phlebitis, Vasculitis Gastrointestinal Denies history of Cirrhosis , Colitis, Crohn s, Hepatitis A, Hepatitis B, Hepatitis C Endocrine Denies history of Type I Diabetes, Type II Diabetes Genitourinary Denies history of End Stage Renal Disease Immunological Denies history of Lupus Erythematosus, Raynaud s, Scleroderma Integumentary (Skin) Denies history of History of Burn, History of pressure wounds Musculoskeletal Patient has history of Osteoarthritis - neck Denies history of Gout, Rheumatoid Arthritis, Osteomyelitis Neurologic Denies history of Dementia, Neuropathy, Quadriplegia, Paraplegia, Seizure Disorder Oncologic Patient has history of Received Chemotherapy - interfeon immunotherapy, Received Radiation Psychiatric Denies history of Anorexia/bulimia, Confinement Anxiety Hospitalization/Surgery History - Melanoma left shoulder. Medical And Surgical History Notes Constitutional Symptoms (General Health) Vein ablation LLE 2010 Vascular Sx ( vein ablationo) LLE 02/2015 Eyes Horners syndrome Genitourinary Kidney stones Neurologic ct scan showed swollen lymph nodes Oncologic Melanoma (R) shoulder 07/2010; (R) axillary lymphnode removal Wickliffe, Keimani J. ([id number]) Objective Constitutional patient is hypertensive.. pulse regular and within target range for patient.Marland Kitchen respirations regular, non-labored and within target range for patient.Marland Kitchen temperature within target range for patient.. [name] and well-hydrated in no acute distress. Vitals Time Taken: 11:45 AM, Height: 62 in, Weight: 199 lbs, BMI: 36.4, Temperature: 98.3  F, Pulse: 51 bpm, Respiratory Rate: 16 breaths/min, Blood Pressure: 169/93 mmHg. Eyes conjunctiva clear no eyelid edema noted. pupils equal round and reactive to light and accommodation. Ears, Nose, Mouth, and Throat no gross abnormality of ear auricles or external auditory canals. normal hearing noted during conversation. mucus membranes moist. Respiratory normal breathing without difficulty. Cardiovascular 1+ dorsalis pedis/posterior tibialis pulses. 2+ pitting edema of the bilateral lower extremities. Musculoskeletal normal gait and posture. no significant deformity or arthritic changes, no loss or range of motion, no clubbing. Psychiatric this patient is able to make decisions and  demonstrates good insight into disease process. Alert and Oriented x 3. pleasant and cooperative. General Notes: Upon inspection patient's wound bed actually showed signs of being superficial but fairly widespread over the medial ankle of the right leg which is somewhat concerning. Her blood pressure is also elevated today which is unfortunate. Nonetheless she tells me at the primary care office this was not nearly as significant previous. Fortunately there does not appear to be any evidence of active infection at this time. Integumentary (Hair, Skin) Wound #15 status is Open. Original cause of wound was Gradually Appeared. The date acquired was: 10/27/2021. The wound is located on the Right,Circumferential Lower Leg. The wound measures 7cm length x 17cm width x 0.1cm depth; 93.462cm^2 area and 9.346cm^3 volume. There is no tunneling or undermining noted. There is a large amount of serous drainage noted. The wound margin is indistinct and nonvisible. There is no granulation within the wound bed. There is a large (67-100%) amount of necrotic tissue within the wound bed including Adherent Slough. Assessment Active Problems ICD-10 Lymphedema, not elsewhere classified Chronic venous hypertension (idiopathic)  with ulcer and inflammation of right lower extremity Non-pressure chronic ulcer of other part of right lower leg limited to breakdown of skin Procedures Wound #15 Pre-procedure diagnosis of Wound #15 is a Lymphedema located on the Right,Circumferential Lower Leg . There was a Three Layer Compression Therapy Procedure by Cornell Barman, RN. Post procedure Diagnosis Wound #15: Same as Pre-Procedure LEOTTA, WEINGARTEN ([id number]) Plan Follow-up Appointments: Return Appointment in 1 week. Nurse Visit as needed Bathing/ Shower/ Hygiene: May shower with wound dressing protected with water repellent cover or cast protector. Laboratory ordered were: Wound culture routine - Right leg The following medication(s) was prescribed: cefdinir oral 300 mg capsule 1 1 capsule oral taken 2 times per day for 14 days starting 11/27/2021 WOUND #15: - Lower Leg Wound Laterality: Right, Circumferential Cleanser: Soap and Water Discharge Instructions: Gently cleanse wound with antibacterial soap, rinse and pat dry prior to dressing wounds Primary Dressing: Silvercel 4 1/4x 4 1/4 (in/in) Discharge Instructions: Apply Silvercel 4 1/4x 4 1/4 (in/in) as instructed Secondary Dressing: ABD Pad 5x9 (in/in) Discharge Instructions: Cover with ABD pad Compression Wrap: Unna-Z Zinc and Calamine Boot, 4x10 (in/yd) Discharge Instructions: Use to anchor wrap in place. Apply one strip around leg 3-finger widths below the knee to secure the wrap. Compression Wrap: Profore Lite LF 3 Multilayer Compression Bandaging System Discharge Instructions: Apply 3 multi-layer wrap as prescribed. 1. Would recommend currently that we go ahead and continue with the recommendation to initiate treatment with a silver cell dressing which I think will be good here. This is not draining as much as it has some in the past and I feel like the silver cell may be preferable to the [facility]. 2. Also can recommend an ABD pad to cover. 3. I am also  can recommend a 3 layer compression wrap which I think will help keep the edema under good control. 4. Also go ahead and send in a prescription for the patient today as well. I think based on her allergies to doxycycline and sulfa probably the best bet would be to initiate treatment with [facility] which I think is a good antibiotic and hopefully will be less likely to make her sick. We will see if this is sufficient once we get the results of the culture back I did obtain the wound culture today as well. We will see patient back for reevaluation in 1 week here in  the clinic. If anything worsens or changes patient will contact our office for additional recommendations. Electronic Signature(s) Signed: 11/27/2021 12:47:55 PM By: Worthy Keeler PA-C Entered By: Worthy Keeler on 11/27/2021 12:47:54 Pack, [name] ([id number]) -------------------------------------------------------------------------------- ROS/PFSH Details Patient Name: SHALISSA, EASTERWOOD. Date of Service: 11/27/2021 11:15 AM Medical Record Number: [id number] Patient Account Number: [id number] Date of Birth/Sex: [birth year] (76 y.o. F) Treating RN: Cornell Barman Primary Care Provider: [name] Other Clinician: Referring Provider: [name] Treating Provider/Extender: [name] in Treatment: 0 Label Progress Note Print Version as History and Physical for this encounter Information Obtained From Patient Constitutional Symptoms (General Health) Medical History: Past Medical History Notes: Vein ablation LLE 2010 Vascular Sx ( vein ablationo) LLE 02/2015 Eyes Medical History: Positive for: Cataracts Negative for: Glaucoma; Optic Neuritis Past Medical History Notes: Horners syndrome Ear/Nose/Mouth/Throat Medical History: Negative for: Chronic sinus problems/congestion; Middle ear problems Hematologic/Lymphatic Medical History: Negative for: Anemia; Hemophilia; Human Immunodeficiency Virus; Lymphedema; Sickle  Cell Disease Respiratory Medical History: Positive for: Asthma; Sleep Apnea Negative for: Aspiration; Chronic Obstructive Pulmonary Disease (COPD); Pneumothorax; Tuberculosis Cardiovascular Medical History: Positive for: Deep Vein Thrombosis - LLE 2010; Hypertension; Peripheral Venous Disease Negative for: Angina; Arrhythmia; Congestive Heart Failure; Coronary Artery Disease; Hypotension; Myocardial Infarction; Peripheral Arterial Disease; Phlebitis; Vasculitis Gastrointestinal Medical History: Negative for: Cirrhosis ; Colitis; Crohnos; Hepatitis A; Hepatitis B; Hepatitis C Endocrine Medical History: Negative for: Type I Diabetes; Type II Diabetes Genitourinary Medical History: Negative for: End Stage Renal Disease Past Medical History Notes: Kidney stones ZYONA, PETTAWAY ([id number]) Immunological Medical History: Negative for: Lupus Erythematosus; Raynaudos; Scleroderma Integumentary (Skin) Medical History: Negative for: History of Burn; History of pressure wounds Musculoskeletal Medical History: Positive for: Osteoarthritis - neck Negative for: Gout; Rheumatoid Arthritis; Osteomyelitis Neurologic Medical History: Negative for: Dementia; Neuropathy; Quadriplegia; Paraplegia; Seizure Disorder Past Medical History Notes: ct scan showed swollen lymph nodes Oncologic Medical History: Positive for: Received Chemotherapy - interfeon immunotherapy; Received Radiation Past Medical History Notes: Melanoma (R) shoulder 07/2010; (R) axillary lymphnode removal Psychiatric Medical History: Negative for: Anorexia/bulimia; Confinement Anxiety HBO Extended History Items Eyes: Cataracts Immunizations Pneumococcal Vaccine: Received Pneumococcal Vaccination: Yes Received Pneumococcal Vaccination On or After 60th Birthday: No Immunization Notes: tetanus shot w/in the last 5 years per pt Implantable Devices None Hospitalization / Surgery History Type of  Hospitalization/Surgery Melanoma left shoulder Family and Social History Cancer: Yes - Mother,Paternal Grandparents; Diabetes: No; Heart Disease: No; Hereditary Spherocytosis: No; Hypertension: Yes - Mother,Father; Kidney Disease: Yes - Paternal Grandparents; Lung Disease: Yes - Paternal Grandparents; Seizures: No; Stroke: No; Thyroid Problems: No; Tuberculosis: No; Former smoker - quit at age 28 - ended on 04/19/1966; Marital Status - Divorced; Alcohol Use: Never; Drug Use: No History; Caffeine Use: Daily; Financial Concerns: No; Food, Clothing or Shelter Needs: No; Support System Lacking: No; Transportation Concerns: No Electronic Signature(s) Signed: 11/27/2021 4:34:41 PM By: Worthy Keeler PA-C Signed: 11/28/2021 5:12:14 PM By: [name], [name], RN, CWS, [name], [name] Entered By: [name], [name], RN, CWS, Kim on 11/27/2021 11:52:56 Limon, [name] ([id number]) -------------------------------------------------------------------------------- SuperBill Details Patient Name: SPRUHA, WEIGHT. Date of Service: 11/27/2021 Medical Record Number: [id number] Patient Account Number: [id number] Date of Birth/Sex: 02/24/1946 (76 y.o. F) Treating RN: Cornell Barman Primary Care Provider: [name] Other Clinician: Referring Provider: [name] Treating Provider/Extender: [name] in Treatment: 0 Diagnosis Coding ICD-10 Codes Code Description I89.0 Lymphedema, not elsewhere classified I87.331 Chronic venous hypertension (idiopathic) with ulcer and inflammation of right lower extremity L97.811 Non-pressure chronic ulcer  of other part of right lower leg limited to breakdown of skin Facility Procedures CPT4 Code: [id number] Description: [place] VISIT-LEV 3 EST PT Modifier: Quantity: 1 Physician Procedures CPT4 Code Description: [id number] [place] 4 - EST PT Modifier: Quantity: 1 CPT4 Code Description: ICD-10 Diagnosis Description I89.0 Lymphedema, not elsewhere  classified I87.331 Chronic venous hypertension (idiopathic) with ulcer and inflammation of rig L97.811 Non-pressure chronic ulcer of other part of right lower leg limited  to brea Modifier: ht lower extremit kdown of skin Quantity: y Engineer, maintenance) Signed: 11/27/2021 12:56:23 PM By: [name], [name], RN, CWS, [name], [name] Signed: 11/27/2021 4:34:41 PM By: Worthy Keeler PA-C Previous Signature: 11/27/2021 12:48:21 PM Version By: Worthy Keeler PA-C Entered By: [name], [name], RN, CWS, Kim on 11/27/2021 12:56:22

## 2021-11-28 NOTE — Progress Notes (Signed)
LANDREY, MAHURIN ([id number]) Visit Report for 11/27/2021 Abuse Risk Screen Details Patient Name: [name], [name]. Date of Service: 11/27/2021 11:15 AM Medical Record Number: [id number] Patient Account Number: [id number] Date of Birth/Sex: [birth year] (76 y.o. F) Treating RN: Cornell Barman Primary Care Raistlin Gum: [name] Other Clinician: Referring Nethaniel Mattie: [name] Treating Gail Creekmore/Extender: [name] in Treatment: 0 Abuse Risk Screen Items Answer ABUSE RISK SCREEN: Has anyone close to you tried to hurt or harm you recentlyo No Do you feel uncomfortable with anyone in your familyo No Has anyone forced you do things that you didnot want to doo No Electronic Signature(s) Signed: 11/28/2021 5:12:14 PM By: [name], [name], RN, CWS, [name], [name] Entered By: [name], [name], RN, CWS, Kim on 11/27/2021 11:53:07 [name], [name] ([id number]) -------------------------------------------------------------------------------- Activities of Daily Living Details Patient Name: [name], [name]. Date of Service: 11/27/2021 11:15 AM Medical Record Number: [id number] Patient Account Number: [id number] Date of Birth/Sex: [birth year] (76 y.o. F) Treating RN: Cornell Barman Primary Care Shyane Fossum: [name] Other Clinician: Referring Morgon Pamer: [name] Treating Zuriyah Shatz/Extender: [name] in Treatment: 0 Activities of Daily Living Items Answer Activities of Daily Living (Please select one for each item) Drive Automobile Completely Able Take Medications Completely Able Use Telephone Completely Able Care for Appearance Completely Able Use Toilet Completely Able Bath / Shower Completely Able Dress Self Completely Able Feed Self Completely Able Walk Completely Able Get In / Out Bed Completely Able Housework Completely Able Prepare Meals Completely Able Handle Money Completely Able Shop for Self Completely Able Electronic Signature(s) Signed: 11/28/2021 5:12:14 PM By:  [name], [name], RN, CWS, [name], [name] Entered By: [name], [name], RN, CWS, Kim on 11/27/2021 11:53:24 [name], [name] ([id number]) -------------------------------------------------------------------------------- Education Screening Details Patient Name: [name], [name]. Date of Service: 11/27/2021 11:15 AM Medical Record Number: [id number] Patient Account Number: [id number] Date of Birth/Sex: 02-10-1946 (76 y.o. F) Treating RN: Cornell Barman Primary Care Ranveer Wahlstrom: [name] Other Clinician: Referring Windi Toro: [name] Treating Marcanthony Sleight/Extender: [name] in Treatment: 0 Primary Learner Assessed: Patient Learning Preferences/Education Level/Primary Language Learning Preference: Explanation, Demonstration Highest Education Level: College or Above Preferred Language: English Cognitive Barrier Language Barrier: No Translator Needed: No Memory Deficit: No Emotional Barrier: No Cultural/Religious Beliefs Affecting Medical Care: No Physical Barrier Impaired Vision: No Impaired Hearing: No Decreased Hand dexterity: No Knowledge/Comprehension Knowledge Level: High Comprehension Level: High Ability to understand written instructions: High Ability to understand verbal instructions: High Motivation Anxiety Level: Calm Cooperation: Cooperative Education Importance: Acknowledges Need Interest in Health Problems: Asks Questions Perception: Coherent Willingness to Engage in Self-Management High Activities: Readiness to Engage in Self-Management High Activities: Engineer, maintenance) Signed: 11/28/2021 5:12:14 PM By: [name], [name], RN, CWS, [name], [name] Entered By: [name], [name], RN, CWS, Kim on 11/27/2021 11:53:54 ZALEA, PETE ([id number]) -------------------------------------------------------------------------------- Fall Risk Assessment Details Patient Name: [name]. Date of Service: 11/27/2021 11:15 AM Medical Record Number: [id number] Patient Account Number:  [id number] Date of Birth/Sex: [birth year] (76 y.o. F) Treating RN: Cornell Barman Primary Care Sayer Masini: [name] Other Clinician: Referring Rillie Riffel: [name] Treating Zanaria Morell/Extender: [name] in Treatment: 0 Fall Risk Assessment Items Have you had 2 or more falls in the last 12 monthso 0 No Have you had any fall that resulted in injury in the last 12 monthso 0 No FALLS RISK SCREEN History of falling - immediate or within 3 months 0 No Secondary diagnosis (Do you have 2 or more medical diagnoseso) 15 Yes Ambulatory aid None/bed rest/wheelchair/nurse 0 No Crutches/cane/walker  15 Yes Furniture 0 No Intravenous therapy Access/Saline/Heparin Lock 0 No Gait/Transferring Normal/ bed rest/ wheelchair 0 No Weak (short steps with or without shuffle, stooped but able to lift head while walking, may 10 Yes seek support from furniture) Impaired (short steps with shuffle, may have difficulty arising from chair, head down, impaired 0 No balance) Mental Status Oriented to own ability 0 Yes Electronic Signature(s) Signed: 11/28/2021 5:12:14 PM By: [name], [name], RN, CWS, [name], [name] Entered By: [name], [name], RN, CWS, Kim on 11/27/2021 11:54:38 [name], [name] ([id number]) -------------------------------------------------------------------------------- Foot Assessment Details Patient Name: [name], [name]. Date of Service: 11/27/2021 11:15 AM Medical Record Number: [id number] Patient Account Number: [id number] Date of Birth/Sex: 22-Dec-1945 (76 y.o. F) Treating RN: Cornell Barman Primary Care Maven Rosander: [name] Other Clinician: Referring Nida Manfredi: [name] Treating Ailea Rhatigan/Extender: Jeri Cos Weeks in Treatment: 0 Foot Assessment Items Site Locations + = Sensation present, - = Sensation absent, C = Callus, U = Ulcer R = Redness, W = Warmth, M = Maceration, PU = Pre-ulcerative lesion F = Fissure, S = Swelling, D = Dryness Assessment Right: Left: Other  Deformity: No No Prior Foot Ulcer: No No Prior Amputation: No No Charcot Joint: No No Ambulatory Status: Ambulatory With Help Assistance Device: Walker GaitEnergy manager) Signed: 11/28/2021 5:12:14 PM By: [name], [name], RN, CWS, [name], [name] Entered By: [name], [name], RN, CWS, Kim on 11/27/2021 12:02:28 [name] ([id number]) -------------------------------------------------------------------------------- Nutrition Risk Screening Details Patient Name: [name], [name]. Date of Service: 11/27/2021 11:15 AM Medical Record Number: [id number] Patient Account Number: [id number] Date of Birth/Sex: [birth year] (76 y.o. F) Treating RN: Cornell Barman Primary Care Mysha Peeler: [name] Other Clinician: Referring Samyak Sackmann: [name] Treating Lancer Thurner/Extender: Jeri Cos Weeks in Treatment: 0 Height (in): 62 Weight (lbs): 199 Body Mass Index (BMI): 36.4 Nutrition Risk Screening Items Score Screening NUTRITION RISK SCREEN: I have an illness or condition that made me change the kind and/or amount of food I eat 0 No I eat fewer than two meals per day 0 No I eat few fruits and vegetables, or milk products 2 Yes I have three or more drinks of beer, liquor or wine almost every day 0 No I have tooth or mouth problems that make it hard for me to eat 0 No I don't always have enough money to buy the food I need 0 No I eat alone most of the time 1 Yes I take three or more different prescribed or over-the-counter drugs a day 1 Yes Without wanting to, I have lost or gained 10 pounds in the last six months 2 Yes I am not always physically able to shop, cook and/or feed myself 0 No Nutrition Protocols Good Risk Protocol Moderate Risk Protocol High Risk Proctocol 0 Provide education on nutrition Risk Level: High Risk Score: 6 Notes Patient is being monitored by PCP for weight loss. Has lost 35lbs in the last 6 months. Electronic Signature(s) Signed: 11/28/2021 5:12:14 PM By:  [name], [name], RN, CWS, [name], [name] Entered By: [name], [name], RN, CWS, Kim on 11/27/2021 11:56:40

## 2021-11-28 NOTE — Progress Notes (Signed)
[name] ([id number]) Visit Report for 11/27/2021 Allergy List Details Patient Name: [name], [name]. Date of Service: 11/27/2021 11:15 AM Medical Record Number: [id number] Patient Account Number: [id number] Date of Birth/Sex: 1946/03/30 (76 y.o. F) Treating RN: [name] Primary Care Viriginia Amendola: [name] Other Clinician: Referring [name]: [name] Treating Ali Mclaurin/Extender: [name] in Treatment: 0 Allergies Active Allergies Sulfa (Sulfonamide Antibiotics) Reaction: facial swelling latex Neoprene Voltaren Reaction: wheezing asthma symptoms Severity: Moderate doxycycline oxycodone Reaction: brain fog tramadol Reaction: brain fog Allergy Notes Electronic Signature(s) Signed: 11/28/2021 5:12:14 PM By: [name], [name], RN, CWS, [name], [name] Entered By: [name], [name], RN, CWS, [name] on 11/27/2021 11:50:55 Reining, [name] ([id number]) -------------------------------------------------------------------------------- Arrival Information Details Patient Name: [name], [name]. Date of Service: 11/27/2021 11:15 AM Medical Record Number: [id number] Patient Account Number: [id number] Date of Birth/Sex: [birth year] (76 y.o. F) Treating RN: [name] Primary Care Milissa [name]: [name] Other Clinician: Referring [name]: [name] Treating [name]/Extender: [name] in Treatment: 0 Visit Information Patient Arrived: Wheel Chair Arrival Time: 11:42 Accompanied By: self Transfer Assistance: None Patient Identification Verified: Yes Secondary Verification Process Completed: Yes Patient Requires Transmission-Based No Precautions: Patient Has Alerts: Yes Patient Alerts: Patient on Blood Thinner 81mg  aspirin History Since Last Visit Electronic Signature(s) Signed: 11/28/2021 5:12:14 PM By: [name], [name], RN, CWS, [name], [name] Entered By: [name], [name], RN, CWS, [name] on 11/27/2021 11:43:52 Plumb, [name]  ([id number]) -------------------------------------------------------------------------------- Clinic Level of Care Assessment Details Patient Name: [name], [name]. Date of Service: 11/27/2021 11:15 AM Medical Record Number: [id number] Patient Account Number: [id number] Date of Birth/Sex: July 19, 1946 (76 y.o. F) Treating RN: [name] Primary Care [name]: [name] Other Clinician: Referring [name]: [name] Treating [name]/Extender: [name] in Treatment: 0 Clinic Level of Care Assessment Items TOOL 1 Quantity Score []  - Use when EandM and Procedure is performed on INITIAL visit 0 ASSESSMENTS - Nursing Assessment / Reassessment X - General Physical Exam (combine w/ comprehensive assessment (listed just below) when performed on new 1 20 pt. evals) X- 1 25 Comprehensive Assessment (HX, ROS, Risk Assessments, Wounds Hx, etc.) ASSESSMENTS - Wound and Skin Assessment / Reassessment []  - Dermatologic / Skin Assessment (not related to wound area) 0 ASSESSMENTS - Ostomy and/or Continence Assessment and Care []  - Incontinence Assessment and Management 0 []  - 0 Ostomy Care Assessment and Management (repouching, etc.) PROCESS - Coordination of Care X - Simple Patient / Family Education for ongoing care 1 15 []  - 0 Complex (extensive) Patient / Family Education for ongoing care X- 1 10 Staff obtains Programmer, systems, Records, Test Results / Process Orders []  - 0 Staff telephones HHA, Nursing Homes / Clarify orders / etc []  - 0 Routine Transfer to another Facility (non-emergent condition) []  - 0 Routine Hospital Admission (non-emergent condition) X- 1 15 New Admissions / Biomedical engineer / Ordering NPWT, Apligraf, etc. []  - 0 Emergency Hospital Admission (emergent condition) PROCESS - Special Needs []  - Pediatric / Minor Patient Management 0 []  - 0 Isolation Patient Management []  - 0 Hearing / Language / Visual special needs []  - 0 Assessment of  Community assistance (transportation, D/C planning, etc.) []  - 0 Additional assistance / Altered mentation []  - 0 Support Surface(s) Assessment (bed, cushion, seat, etc.) INTERVENTIONS - Miscellaneous []  - External ear exam 0 []  - 0 Patient Transfer (multiple staff / Civil Service fast streamer / Similar devices) []  - 0 Simple Staple / Suture removal (25 or less) []  - 0 Complex Staple / Suture removal (26 or  more) []  - 0 Hypo/Hyperglycemic Management (do not check if billed separately) []  - 0 Ankle / Brachial Index (ABI) - do not check if billed separately Has the patient been seen at the hospital within the last three years: Yes Total Score: 85 Level Of Care: New/Established - Level 3 [name], [name] ([id number]) Electronic Signature(s) Signed: 11/28/2021 5:12:14 PM By: [name], [name], RN, CWS, [name], [name] Entered By: [name], [name], RN, CWS, [name] on 11/27/2021 12:56:09 [name] ([id number]) -------------------------------------------------------------------------------- Compression Therapy Details Patient Name: [name], [name]. Date of Service: 11/27/2021 11:15 AM Medical Record Number: [id number] Patient Account Number: [id number] Date of Birth/Sex: July 02, 1946 (76 y.o. F) Treating RN: [name] Primary Care [name]: [name] Other Clinician: Referring Raheen Capili: [name] Treating [name]/Extender: [name] in Treatment: 0 Compression Therapy Performed for Wound Assessment: Wound #15 Right,Circumferential Lower Leg Performed By: Clinician Amber Barman, RN Compression Type: Three Layer Post Procedure Diagnosis Same as Pre-procedure Electronic Signature(s) Signed: 11/28/2021 5:12:14 PM By: [name], [name], RN, CWS, [name], [name] Entered By: [name], [name], RN, CWS, [name] on 11/27/2021 12:31:33 [name], [name] ([id number]) -------------------------------------------------------------------------------- Encounter Discharge Information Details Patient Name: [name], [name]. Date of  Service: 11/27/2021 11:15 AM Medical Record Number: [id number] Patient Account Number: [id number] Date of Birth/Sex: 1946/03/10 (76 y.o. F) Treating RN: [name] Primary Care Rashee Marschall: [name] Other Clinician: Referring Willetta York: [name] Treating Lawrence Roldan/Extender: [name] in Treatment: 0 Encounter Discharge Information Items Discharge Condition: Stable Ambulatory Status: Wheelchair Discharge Destination: Home Transportation: Other Accompanied By: self Schedule Follow-up Appointment: Yes Clinical Summary of Care: Electronic Signature(s) Signed: 11/27/2021 1:01:52 PM By: [name], [name], RN, CWS, [name], [name] Entered By: [name], [name], RN, CWS, [name] on 11/27/2021 13:01:52 [name] ([id number]) -------------------------------------------------------------------------------- Lower Extremity Assessment Details Patient Name: [name], [name]. Date of Service: 11/27/2021 11:15 AM Medical Record Number: [id number] Patient Account Number: [id number] Date of Birth/Sex: 1946-04-04 (76 y.o. F) Treating RN: [name] Primary Care Freedom [name]: [name] Other Clinician: Referring Dijuan Sleeth: [name] Treating Kelia Gibbon/Extender: Jeri Cos Weeks in Treatment: 0 Edema Assessment Assessed: [Left: No] [Right: No] Edema: [Left: Ye] [Right: s] Calf Left: Right: Point of Measurement: 30 cm From Medial Instep 46 cm Ankle Left: Right: Point of Measurement: 13 cm From Medial Instep 24 cm Vascular Assessment Pulses: Dorsalis Pedis Palpable: [Right:No] Doppler Audible: [Right:Yes] Posterior Tibial Palpable: [Right:No Yes] Notes Patient's leg is red, swollen, warm to touch. Patient did not tolerate Cuff on lower leg. Electronic Signature(s) Signed: 11/28/2021 5:12:14 PM By: [name], [name], RN, CWS, [name], [name] Entered By: [name], [name], RN, CWS, [name] on 11/27/2021 12:32:24 CARIGAN, LISTER  ([id number]) -------------------------------------------------------------------------------- Multi Wound Chart Details Patient Name: [name], [name]. Date of Service: 11/27/2021 11:15 AM Medical Record Number: [id number] Patient Account Number: [id number] Date of Birth/Sex: 1945/12/01 (76 y.o. F) Treating RN: [name] Primary Care Brevan Luberto: [name] Other Clinician: Referring Genesi Stefanko: [name] Treating Rashaunda Rahl/Extender: [name] in Treatment: 0 Vital Signs Height(in): 84 Pulse(bpm): 61 Weight(lbs): 199 Blood Pressure(mmHg): 169/93 Body Mass Index(BMI): 36.4 Temperature(F): 98.3 Respiratory Rate(breaths/min): 16 Photos: [N/A:N/A] Wound Location: Right, Circumferential Lower Leg N/A N/A Wounding Event: Gradually Appeared N/A N/A Primary Etiology: Lymphedema N/A N/A Secondary Etiology: Venous Leg Ulcer N/A N/A Comorbid History: Cataracts, Asthma, Sleep Apnea, N/A N/A Deep Vein Thrombosis, Hypertension, Peripheral Venous Disease, Osteoarthritis, Received Chemotherapy, Received Radiation Date Acquired: 10/27/2021 N/A N/A Weeks of Treatment: 0 N/A N/A Wound Status: Open N/A N/A Wound Recurrence: No N/A N/A Measurements L x W  x D (cm) 7x17x0.1 N/A N/A Area (cm) : 93.462 N/A N/A Volume (cm) : 9.346 N/A N/A % Reduction in Area: 0.00% N/A N/A % Reduction in Volume: 0.00% N/A N/A Classification: Partial Thickness N/A N/A Exudate Amount: Large N/A N/A Exudate Type: Serous N/A N/A Exudate Color: amber N/A N/A Wound Margin: Indistinct, nonvisible N/A N/A Granulation Amount: None Present (0%) N/A N/A Necrotic Amount: Large (67-100%) N/A N/A Exposed Structures: Fascia: No N/A N/A Fat Layer (Subcutaneous Tissue): No Tendon: No Muscle: No Joint: No Bone: No Assessment Notes: Removed clump of wet, cat hair N/A N/A from the wound. Cat hairs all over patient's leg and clothing. Procedures Performed: Compression Therapy N/A N/A Treatment  Notes Electronic Signature(s) DAMAYA, CHANNING ([id number]) Signed: 11/27/2021 12:54:47 PM By: [name], [name], RN, CWS, [name], [name] Entered By: [name], [name], RN, CWS, [name] on 11/27/2021 12:54:46 Bieri, [name] ([id number]) -------------------------------------------------------------------------------- Multi-Disciplinary Care Plan Details Patient Name: [name], [name]. Date of Service: 11/27/2021 11:15 AM Medical Record Number: [id number] Patient Account Number: [id number] Date of Birth/Sex: [birth year] (76 y.o. F) Treating RN: [name] Primary Care Elisabetta Mishra: [name] Other Clinician: Referring Kalib Bhagat: [name] Treating Dabney Schanz/Extender: [name] in Treatment: 0 Active Inactive Necrotic Tissue Nursing Diagnoses: Impaired tissue integrity related to necrotic/devitalized tissue Knowledge deficit related to management of necrotic/devitalized tissue Goals: Necrotic/devitalized tissue will be minimized in the wound bed Date Initiated: 11/27/2021 Target Resolution Date: 12/25/2021 Goal Status: Active Patient/caregiver will verbalize understanding of reason and process for debridement of necrotic tissue Date Initiated: 11/27/2021 Target Resolution Date: 12/25/2021 Goal Status: Active Interventions: Assess patient pain level pre-, during and post procedure and prior to discharge Provide education on necrotic tissue and debridement process Notes: Orientation to the Wound Care Program Nursing Diagnoses: Knowledge deficit related to the wound healing center program Goals: Patient/caregiver will verbalize understanding of the [place] Date Initiated: 11/27/2021 Target Resolution Date: 12/25/2021 Goal Status: Active Interventions: Provide education on orientation to the wound center Notes: Soft Tissue Infection Nursing Diagnoses: Impaired tissue integrity Knowledge deficit related to disease process and management Knowledge deficit related to home  infection control: handwashing, handling of soiled dressings, supply storage Potential for infection: soft tissue Goals: Patient will remain free of wound infection Date Initiated: 11/27/2021 Target Resolution Date: 12/25/2021 Goal Status: Active Patient/caregiver will verbalize understanding of or measures to prevent infection and contamination in the home setting Date Initiated: 11/27/2021 Target Resolution Date: 12/25/2021 Goal Status: Active Patient's soft tissue infection will resolve Date Initiated: 11/27/2021 Target Resolution Date: 12/25/2021 Goal Status: Active JAIDYN, USERY ([id number]) Signs and symptoms of infection will be recognized early to allow for prompt treatment Date Initiated: 11/27/2021 Target Resolution Date: 12/25/2021 Goal Status: Active Interventions: Assess signs and symptoms of infection every visit Provide education on infection Treatment Activities: Culture and sensitivity : 11/27/2021 Notes: Venous Leg Ulcer Nursing Diagnoses: Actual venous Insuffiency (use after diagnosis is confirmed) Knowledge deficit related to disease process and management Potential for venous Insuffiency (use before diagnosis confirmed) Goals: Patient will maintain optimal edema control Date Initiated: 11/27/2021 Target Resolution Date: 12/25/2021 Goal Status: Active Patient/caregiver will verbalize understanding of disease process and disease management Date Initiated: 11/27/2021 Target Resolution Date: 12/25/2021 Goal Status: Active Verify adequate tissue perfusion prior to therapeutic compression application Date Initiated: 11/27/2021 Target Resolution Date: 12/25/2021 Goal Status: Active Interventions: Provide education on venous insufficiency Treatment Activities: Therapeutic compression applied : 11/27/2021 Notes: Wound/Skin Impairment Nursing Diagnoses: Impaired tissue integrity Knowledge deficit related to smoking impact on wound  healing Knowledge deficit related to  ulceration/compromised skin integrity Goals: Patient/caregiver will verbalize understanding of skin care regimen Date Initiated: 11/27/2021 Target Resolution Date: 12/25/2021 Goal Status: Active Ulcer/skin breakdown will have a volume reduction of 30% by week 4 Date Initiated: 11/27/2021 Target Resolution Date: 12/25/2021 Goal Status: Active Ulcer/skin breakdown will have a volume reduction of 50% by week 8 Date Initiated: 11/27/2021 Target Resolution Date: 01/22/2022 Goal Status: Active Ulcer/skin breakdown will have a volume reduction of 80% by week 12 Date Initiated: 11/27/2021 Target Resolution Date: 02/22/2022 Goal Status: Active Ulcer/skin breakdown will heal within 14 weeks Date Initiated: 11/27/2021 Target Resolution Date: 03/08/2022 Goal Status: Active Interventions: Assess ulceration(s) every visit Provide education on ulcer and skin care JISEL, FLEET ([id number]) Treatment Activities: Skin care regimen initiated : 11/27/2021 Topical wound management initiated : 11/27/2021 Notes: Electronic Signature(s) Signed: 11/27/2021 12:54:27 PM By: [name], [name], RN, CWS, [name], [name] Entered By: [name], [name], RN, CWS, [name] on 11/27/2021 12:54:27 Parillo, [name] ([id number]) -------------------------------------------------------------------------------- Pain Assessment Details Patient Name: [name], [name]. Date of Service: 11/27/2021 11:15 AM Medical Record Number: [id number] Patient Account Number: [id number] Date of Birth/Sex: 31-May-1946 (76 y.o. F) Treating RN: [name] Primary Care Chevelle Durr: [name] Other Clinician: Referring Lorilynn Lehr: [name] Treating Tlaloc Taddei/Extender: [name] in Treatment: 0 Active Problems Location of Pain Severity and Description of Pain Patient Has Paino No Site Locations Pain Management and Medication Current Pain Management: Notes just uncomfortable. Electronic Signature(s) Signed: 11/28/2021 5:12:14 PM By: [name], [name], RN,  CWS, [name], [name] Entered By: [name], [name], RN, CWS, [name] on 11/27/2021 11:47:26 Ohnemus, [name] ([id number]) -------------------------------------------------------------------------------- Patient/Caregiver Education Details Patient Name: [name], [name]. Date of Service: 11/27/2021 11:15 AM Medical Record Number: [id number] Patient Account Number: [id number] Date of Birth/Gender: [birth year] (76 y.o. F) Treating RN: [name] Primary Care Physician: [name] Other Clinician: Referring Physician: Ria [name] Treating Physician/Extender: [name] in Treatment: 0 Education Assessment Education Provided To: Patient Education Topics Provided Infection: Handouts: Infection Prevention and Management Methods: Demonstration, Explain/Verbal Responses: State content correctly Venous: Handouts: Controlling Swelling with Multilayered Compression Wraps Methods: Demonstration, Explain/Verbal Responses: State content correctly Welcome To [facility]: Handouts: Welcome To [facility] Methods: Demonstration, Explain/Verbal Responses: State content correctly Wound/Skin Impairment: Handouts: Caring for Your Ulcer Methods: Demonstration, Explain/Verbal Responses: State content correctly Electronic Signature(s) Signed: 11/28/2021 5:12:14 PM By: [name], [name], RN, CWS, [name], [name] Entered By: [name], [name], RN, CWS, [name] on 11/27/2021 12:57:15 [name] ([id number]) -------------------------------------------------------------------------------- Wound Assessment Details Patient Name: [name], [name]. Date of Service: 11/27/2021 11:15 AM Medical Record Number: [id number] Patient Account Number: [id number] Date of Birth/Sex: August 23, 1946 (76 y.o. F) Treating RN: [name] Primary Care Jylan Loeza: [name] Other Clinician: Referring Timoth Schara: [name] Treating Anjolaoluwa Siguenza/Extender: Jeri Cos Weeks in Treatment: 0 Wound Status Wound Number: 15  Primary Lymphedema Etiology: Wound Location: Right, Circumferential Lower Leg Secondary Venous Leg Ulcer Wounding Event: Gradually Appeared Etiology: Date Acquired: 10/27/2021 Wound Open Weeks Of Treatment: 0 Status: Clustered Wound: No Comorbid Cataracts, Asthma, Sleep Apnea, Deep Vein Thrombosis, History: Hypertension, Peripheral Venous Disease, Osteoarthritis, Received Chemotherapy, Received Radiation Photos Wound Measurements Length: (cm) 7 Width: (cm) 17 Depth: (cm) 0.1 Area: (cm) 93.462 Volume: (cm) 9.346 % Reduction in Area: 0% % Reduction in Volume: 0% Tunneling: No Undermining: No Wound Description Classification: Partial Thickness Wound Margin: Indistinct, nonvisible Exudate Amount: Large Exudate Type: Serous Exudate Color: amber Foul Odor After Cleansing: No Slough/Fibrino Yes Wound Bed Granulation Amount: None  Present (0%) Exposed Structure Necrotic Amount: Large (67-100%) Fascia Exposed: No Necrotic Quality: Adherent Slough Fat Layer (Subcutaneous Tissue) Exposed: No Tendon Exposed: No Muscle Exposed: No Joint Exposed: No Bone Exposed: No Assessment Notes Removed clump of wet, cat hair from the wound. Cat hairs all over patient's leg and clothing. Treatment Notes Wound #15 (Lower Leg) Wound Laterality: Right, Circumferential Semidey, Calianna J. ([id number]) Cleanser Soap and Water Discharge Instruction: Gently cleanse wound with antibacterial soap, rinse and pat dry prior to dressing wounds Peri-Wound Care Topical Primary Dressing Silvercel 4 1/4x 4 1/4 (in/in) Discharge Instruction: Apply Silvercel 4 1/4x 4 1/4 (in/in) as instructed Secondary Dressing ABD Pad 5x9 (in/in) Discharge Instruction: Cover with ABD pad Secured With Compression Wrap Unna-Z Zinc and Calamine Boot, 4x10 (in/yd) Discharge Instruction: Use to anchor wrap in place. Apply one strip around leg 3-finger widths below the knee to secure the wrap. Profore Lite LF 3 Multilayer  Compression Bandaging System Discharge Instruction: Apply 3 multi-layer wrap as prescribed. Compression Stockings Environmental education officer) Signed: 11/27/2021 12:53:35 PM By: [name], [name], RN, CWS, [name], [name] Entered By: [name], [name], RN, CWS, [name] on 11/27/2021 12:53:34 Scronce, [name] ([id number]) -------------------------------------------------------------------------------- Vitals Details Patient Name: ANNITTA, FIFIELD. Date of Service: 11/27/2021 11:15 AM Medical Record Number: [id number] Patient Account Number: [id number] Date of Birth/Sex: 09-16-1946 (76 y.o. F) Treating RN: [name] Primary Care Blondina Coderre: [name] Other Clinician: Referring Sabrie Moritz: [name] Treating Gethsemane Fischler/Extender: [name] in Treatment: 0 Vital Signs Time Taken: 11:45 Temperature (F): 98.3 Height (in): 62 Pulse (bpm): 51 Weight (lbs): 199 Respiratory Rate (breaths/min): 16 Body Mass Index (BMI): 36.4 Blood Pressure (mmHg): 169/93 Reference Range: 80 - 120 mg / dl Electronic Signature(s) Signed: 11/28/2021 5:12:14 PM By: [name], [name], RN, CWS, [name], [name] Entered By: [name], [name], RN, CWS, [name] on 11/27/2021 11:48:15

## 2021-11-30 ENCOUNTER — Other Ambulatory Visit: Payer: Self-pay

## 2021-11-30 DIAGNOSIS — L97811 Non-pressure chronic ulcer of other part of right lower leg limited to breakdown of skin: Secondary | ICD-10-CM | POA: Diagnosis not present

## 2021-11-30 DIAGNOSIS — I890 Lymphedema, not elsewhere classified: Secondary | ICD-10-CM | POA: Diagnosis not present

## 2021-11-30 DIAGNOSIS — I87331 Chronic venous hypertension (idiopathic) with ulcer and inflammation of right lower extremity: Secondary | ICD-10-CM | POA: Diagnosis not present

## 2021-11-30 LAB — AEROBIC CULTURE W GRAM STAIN (SUPERFICIAL SPECIMEN): Gram Stain: NONE SEEN

## 2021-11-30 NOTE — Progress Notes (Signed)
MODENE, ANDY ([id number]) Visit Report for 11/30/2021 Physician Orders Details Patient Name: [name], [name]. Date of Service: 11/30/2021 11:15 AM Medical Record Number: [id number] Patient Account Number: [id number] Date of Birth/Sex: 01/26/1946 (76 y.o. F) Treating RN: Cornell Barman Primary Care Provider: [name] Other Clinician: Referring Provider: [name] Treating Provider/Extender: [name] in Treatment: 0 Verbal / Phone Orders: No Diagnosis Coding Follow-up Appointments o Return Appointment in 1 week. o Nurse Visit as needed Bathing/ Shower/ Hygiene o May shower with wound dressing protected with water repellent cover or cast protector. Wound Treatment Wound #15 - Lower Leg Wound Laterality: Right, Circumferential Cleanser: Soap and Water Discharge Instructions: Gently cleanse wound with antibacterial soap, rinse and pat dry prior to dressing wounds Topical: Triamcinolone Acetonide Cream, 0.1%, 15 (g) tube Discharge Instructions: Apply as directed by provider. Primary Dressing: Xtrasorb Classic Super [employer], 6x9 (in/in) Discharge Instructions: over open areas of the leg that are weeping Compression Wrap: Unna-Z Zinc and Calamine Boot, 4x10 (in/yd) Discharge Instructions: Use to anchor wrap in place. Apply one strip around leg 3-finger widths below the knee to secure the wrap. Compression Wrap: Profore Lite LF 3 Multilayer Compression Bandaging System Discharge Instructions: Apply 3 multi-layer wrap as prescribed. Patient Medications Allergies: Voltaren, Sulfa (Sulfonamide Antibiotics), latex, Neoprene, doxycycline, oxycodone, tramadol Notifications Medication Indication Start End Levaquin 11/30/2021 DOSE 1 - oral 500 mg tablet - 1 tablet oral taken 1 time per day for 14 days. Do not take your multivitamin with this medication Electronic Signature(s) Signed: 11/30/2021 12:05:52 PM By: Worthy Keeler PA-C Previous Signature:  11/30/2021 12:04:08 PM Version By: Worthy Keeler PA-C Entered By: Worthy Keeler on 11/30/2021 12:05:52

## 2021-11-30 NOTE — Progress Notes (Signed)
ARLEE, BOSSARD ([id number]) Visit Report for 11/30/2021 Arrival Information Details Patient Name: [name], [name]. Date of Service: 11/30/2021 11:15 AM Medical Record Number: [id number] Patient Account Number: [id number] Date of Birth/Sex: [birth year] (76 y.o. F) Treating RN: Cornell Barman Primary Care Viggo Perko: [name] Other Clinician: Referring Dulcey Riederer: [name] Treating Misbah Hornaday/Extender: [name] in Treatment: 0 Visit Information History Since Last Visit Added or deleted any medications: No Patient Arrived: Wheel Chair Has Dressing in Place as Prescribed: Yes Arrival Time: 11:17 Has Compression in Place as Prescribed: Yes Accompanied By: self Pain Present Now: No Transfer Assistance: None Patient Identification Verified: Yes Secondary Verification Process Completed: Yes Patient Requires Transmission-Based No Precautions: Patient Has Alerts: Yes Patient Alerts: Patient on Blood Thinner 81mg  aspirin Electronic Signature(s) Signed: 11/30/2021 5:13:08 PM By: [name], [name], RN, CWS, [name], [name] Entered By: [name], [name], RN, CWS, Kim on 11/30/2021 11:38:48 [name] ([id number]) -------------------------------------------------------------------------------- Compression Therapy Details Patient Name: [name]. Date of Service: 11/30/2021 11:15 AM Medical Record Number: [id number] Patient Account Number: [id number] Date of Birth/Sex: [birth year] (76 y.o. F) Treating RN: Cornell Barman Primary Care Aamirah Salmi: [name] Other Clinician: Referring Orianna Biskup: [name] Treating Miho Monda/Extender: [name] in Treatment: 0 Compression Therapy Performed for Wound Assessment: Wound #15 Right,Circumferential Lower Leg Performed By: Clinician Cornell Barman, RN Compression Type: Three Hydrologist) Signed: 11/30/2021 5:13:08 PM By: [name], [name], RN, CWS, [name], [name] Entered By: [name], [name], RN, CWS, Kim on 11/30/2021  11:41:32 [place], [name] ([id number]) -------------------------------------------------------------------------------- Encounter Discharge Information Details Patient Name: [name]. Date of Service: 11/30/2021 11:15 AM Medical Record Number: [id number] Patient Account Number: [id number] Date of Birth/Sex: 01/17/1946 (76 y.o. F) Treating RN: Cornell Barman Primary Care Sheralee Qazi: [name] Other Clinician: Referring Mostyn Varnell: [name] Treating Jessicalynn Deshong/Extender: [name] in Treatment: 0 Encounter Discharge Information Items Discharge Condition: Stable Ambulatory Status: Ambulatory Discharge Destination: Home Transportation: Private Auto Schedule Follow-up Appointment: Yes Clinical Summary of Care: Electronic Signature(s) Signed: 11/30/2021 4:33:49 PM By: [name], [name], RN, CWS, [name], [name] Entered By: [name], [name], RN, CWS, Kim on 11/30/2021 16:33:49 [name] ([id number]) -------------------------------------------------------------------------------- Wound Assessment Details Patient Name: [name]. Date of Service: 11/30/2021 11:15 AM Medical Record Number: [id number] Patient Account Number: [id number] Date of Birth/Sex: [birth year] (76 y.o. F) Treating RN: Cornell Barman Primary Care Shaan Rhoads: [name] Other Clinician: Referring Jairo Bellew: [name] Treating Amely Voorheis/Extender: Jeri Cos Weeks in Treatment: 0 Wound Status Wound Number: 15 Primary Lymphedema Etiology: Wound Location: Right, Circumferential Lower Leg Secondary Venous Leg Ulcer Wounding Event: Gradually Appeared Etiology: Date Acquired: 10/27/2021 Wound Open Weeks Of Treatment: 0 Status: Clustered Wound: No Comorbid Cataracts, Asthma, Sleep Apnea, Deep Vein Thrombosis, History: Hypertension, Peripheral Venous Disease, Osteoarthritis, Received Chemotherapy, Received Radiation Photos Wound Measurements Length: (cm) 7 % Re Width: (cm) 17 % Re Depth: (cm) 0.1  Epit Area: (cm) 93.462 Tun Volume: (cm) 9.346 Und duction in Area: 0% duction in Volume: 0% helialization: None neling: No ermining: No Wound Description Classification: Partial Thickness Foul Wound Margin: Indistinct, nonvisible Slou Exudate Amount: Large Exudate Type: Serous Exudate Color: amber Odor After Cleansing: No gh/Fibrino Yes Wound Bed Granulation Amount: None Present (0%) Exposed Structure Necrotic Amount: Large (67-100%) Fascia Exposed: No Necrotic Quality: Adherent Slough Fat Layer (Subcutaneous Tissue) Exposed: No Tendon Exposed: No Muscle Exposed: No Joint Exposed: No Bone Exposed: No Treatment Notes Wound #15 (Lower Leg) Wound Laterality: Right, Circumferential Cleanser Soap and [address] LILLIE, PORTNER ([id number]) Discharge Instruction: Gently cleanse wound  with antibacterial soap, rinse and pat dry prior to dressing wounds Peri-Wound Care Topical Triamcinolone Acetonide Cream, 0.1%, 15 (g) tube Discharge Instruction: Apply as directed by Miaya Lafontant. Primary Dressing Xtrasorb Classic Super Absorbent Dressing, 6x9 (in/in) Discharge Instruction: over open areas of the leg that are weeping Secondary Dressing Secured With Compression Wrap Unna-Z Zinc and Calamine Boot, 4x10 (in/yd) Discharge Instruction: Use to anchor wrap in place. Apply one strip around leg 3-finger widths below the knee to secure the wrap. Profore Lite LF 3 Multilayer Compression Bandaging System Discharge Instruction: Apply 3 multi-layer wrap as prescribed. Compression Stockings Environmental education officer) Signed: 11/30/2021 5:13:08 PM By: [name], [name], RN, CWS, [name], [name] Entered By: [name], [name], RN, CWS, Kim on 11/30/2021 11:40:13

## 2021-12-01 NOTE — Assessment & Plan Note (Signed)
No clear ongoing infection. Increase lasix  to 2 daily x  5 days.   Increase potassium while on higher dose of lasix to 2 tabs twice daily.  As soon as able pout back on compression hose.  Continue elevating legs.  Wash legs with antibacterial soap.

## 2021-12-01 NOTE — Assessment & Plan Note (Signed)
Improving memory off of anticholinergic medication. Follow up with PCP regarding this as planned.

## 2021-12-05 ENCOUNTER — Encounter: Payer: Medicare Other | Admitting: Physician Assistant

## 2021-12-05 ENCOUNTER — Other Ambulatory Visit: Payer: Self-pay

## 2021-12-05 DIAGNOSIS — I890 Lymphedema, not elsewhere classified: Secondary | ICD-10-CM | POA: Diagnosis not present

## 2021-12-05 DIAGNOSIS — L97811 Non-pressure chronic ulcer of other part of right lower leg limited to breakdown of skin: Secondary | ICD-10-CM | POA: Diagnosis not present

## 2021-12-05 DIAGNOSIS — I87331 Chronic venous hypertension (idiopathic) with ulcer and inflammation of right lower extremity: Secondary | ICD-10-CM | POA: Diagnosis not present

## 2021-12-05 NOTE — Progress Notes (Addendum)
Amber, [name] ([id number]) Visit Report for 12/05/2021 Arrival Information Details Patient Name: Amber, [name]. Date of Service: 12/05/2021 11:15 AM Medical Record Number: [id number] Patient Account Number: [id number] Date of Birth/Sex: [birth year] (76 y.o. F) Treating RN: [name] Primary Care [name]: [name] Other Clinician: Referring [name]: [name] Treating Taeja Debellis/Extender: [name] in Treatment: 1 Visit Information History Since Last Visit Added or deleted any medications: No Patient Arrived: Wheel Chair Any new allergies or adverse reactions: No Arrival Time: 11:50 Had a fall or experienced change in No Accompanied By: self activities of daily living that may affect Transfer Assistance: EasyPivot Patient Lift risk of falls: Patient Identification Verified: Yes Hospitalized since last visit: No Secondary Verification Process Completed: Yes Has Dressing in Place as Prescribed: Yes Patient Requires Transmission-Based No Has Compression in Place as Prescribed: Yes Precautions: Pain Present Now: Yes Patient Has Alerts: Yes Patient Alerts: Patient on Blood Thinner 81mg  aspirin Electronic Signature(s) Signed: 12/05/2021 4:42:42 PM By: [name] Entered By: [name] on 12/05/2021 11:54:04 [name], [name] ([id number]) -------------------------------------------------------------------------------- Clinic Level of Care Assessment Details Patient Name: Amber, [name]. Date of Service: 12/05/2021 11:15 AM Medical Record Number: [id number] Patient Account Number: [id number] Date of Birth/Sex: June 25, 1946 (76 y.o. F) Treating RN: [name] Primary Care Rafael Salway: [name] Other Clinician: Referring [name]: [name] Treating Mesiah [name]/Extender: [name] in Treatment: 1 Clinic Level of Care Assessment Items TOOL 1 Quantity Score []  - Use when EandM and Procedure is performed on INITIAL visit  0 ASSESSMENTS - Nursing Assessment / Reassessment []  - General Physical Exam (combine w/ comprehensive assessment (listed just below) when performed on new 0 pt. evals) []  - 0 Comprehensive Assessment (HX, ROS, Risk Assessments, Wounds Hx, etc.) ASSESSMENTS - Wound and Skin Assessment / Reassessment []  - Dermatologic / Skin Assessment (not related to wound area) 0 ASSESSMENTS - Ostomy and/or Continence Assessment and Care []  - Incontinence Assessment and Management 0 []  - 0 Ostomy Care Assessment and Management (repouching, etc.) PROCESS - Coordination of Care []  - Simple Patient / Family Education for ongoing care 0 []  - 0 Complex (extensive) Patient / Family Education for ongoing care []  - 0 Staff obtains Programmer, systems, Records, Test Results / Process Orders []  - 0 Staff telephones HHA, Nursing Homes / Clarify orders / etc []  - 0 Routine Transfer to another Facility (non-emergent condition) []  - 0 Routine Hospital Admission (non-emergent condition) []  - 0 New Admissions / Biomedical engineer / Ordering NPWT, Apligraf, etc. []  - 0 Emergency Hospital Admission (emergent condition) PROCESS - Special Needs []  - Pediatric / Minor Patient Management 0 []  - 0 Isolation Patient Management []  - 0 Hearing / Language / Visual special needs []  - 0 Assessment of Community assistance (transportation, D/C planning, etc.) []  - 0 Additional assistance / Altered mentation []  - 0 Support Surface(s) Assessment (bed, cushion, seat, etc.) INTERVENTIONS - Miscellaneous []  - External ear exam 0 []  - 0 Patient Transfer (multiple staff / Civil Service fast streamer / Similar devices) []  - 0 Simple Staple / Suture removal (25 or less) []  - 0 Complex Staple / Suture removal (26 or more) []  - 0 Hypo/Hyperglycemic Management (do not check if billed separately) []  - 0 Ankle / Brachial Index (ABI) - do not check if billed separately Has the patient been seen at the hospital within the last three years:  Yes Total Score: 0 Level Of Care: ____ [name] ([id number]) Electronic Signature(s) Signed: 12/05/2021 4:42:42 PM By: [name] Entered By: [name],  [name] on 12/05/2021 12:42:58 Amber, [name] ([id number]) -------------------------------------------------------------------------------- Compression Therapy Details Patient Name: Amber, [name]. Date of Service: 12/05/2021 11:15 AM Medical Record Number: [id number] Patient Account Number: [id number] Date of Birth/Sex: 1946/02/06 (76 y.o. F) Treating RN: [name] Primary Care Morna Flud: [name] Other Clinician: Referring [name]: [name] Treating [name]/Extender: [name] Weeks in Treatment: 1 Compression Therapy Performed for Wound Assessment: Wound #15 Right,Circumferential Lower Leg Performed By: Clinician Amber Dredge, RN Compression Type: Three Layer Post Procedure Diagnosis Same as Pre-procedure Electronic Signature(s) Signed: 12/05/2021 4:42:42 PM By: [name] Entered By: [name] on 12/05/2021 12:42:48 Amber, [name] ([id number]) -------------------------------------------------------------------------------- Encounter Discharge Information Details Patient Name: Amber, [name]. Date of Service: 12/05/2021 11:15 AM Medical Record Number: [id number] Patient Account Number: [id number] Date of Birth/Sex: [birth year] (76 y.o. F) Treating RN: [name] Primary Care Aikam Hellickson: [name] Other Clinician: Referring [name]: [name] Treating [name]/Extender: [name] in Treatment: 1 Encounter Discharge Information Items Discharge Condition: Stable Ambulatory Status: Wheelchair Discharge Destination: Home Transportation: Private Auto Accompanied By: self Schedule Follow-up Appointment: Yes Clinical Summary of Care: Electronic Signature(s) Signed: 12/05/2021 4:42:42 PM By: [name] Entered By: [name] on 12/05/2021  12:44:03 Amber, [name] ([id number]) -------------------------------------------------------------------------------- Lower Extremity Assessment Details Patient Name: Amber, [name]. Date of Service: 12/05/2021 11:15 AM Medical Record Number: [id number] Patient Account Number: [id number] Date of Birth/Sex: 11-14-1945 (76 y.o. F) Treating RN: [name] Primary Care Voula Waln: [name] Other Clinician: Referring Jos Cygan: [name] Treating Namir Neto/Extender: [name] Weeks in Treatment: 1 Edema Assessment Assessed: [Left: No] [Right: No] Edema: [Left: Ye] [Right: s] Calf Left: Right: Point of Measurement: 30 cm From Medial Instep 38.5 cm Ankle Left: Right: Point of Measurement: 13 cm From Medial Instep 23.5 cm Vascular Assessment Pulses: Dorsalis Pedis Palpable: [Right:Yes] Posterior Tibial Palpable: [Right:Yes] Electronic Signature(s) Signed: 12/05/2021 4:42:42 PM By: [name] Entered By: [name] on 12/05/2021 12:10:47 Sperl, [name] ([id number]) -------------------------------------------------------------------------------- Multi Wound Chart Details Patient Name: MCKINLEY, ADELSTEIN. Date of Service: 12/05/2021 11:15 AM Medical Record Number: [id number] Patient Account Number: [id number] Date of Birth/Sex: 03/20/1946 (76 y.o. F) Treating RN: [name] Primary Care Yasseen Salls: [name] Other Clinician: Referring Anju Sereno: [name] Treating Amedee Amber/Extender: [name] in Treatment: 1 Vital Signs Height(in): 62 Pulse(bpm): 94 Weight(lbs): 199 Blood Pressure(mmHg): 168/96 Body Mass Index(BMI): 36.4 Temperature(F): 98.2 Respiratory Rate(breaths/min): 18 Photos: [N/A:N/A] Wound Location: Right, Circumferential Lower Leg N/A N/A Wounding Event: Gradually Appeared N/A N/A Primary Etiology: Lymphedema N/A N/A Secondary Etiology: Venous Leg Ulcer N/A N/A Comorbid History: Cataracts, Asthma, Sleep Apnea, N/A  N/A Deep Vein Thrombosis, Hypertension, Peripheral Venous Disease, Osteoarthritis, Received Chemotherapy, Received Radiation Date Acquired: 10/27/2021 N/A N/A Weeks of Treatment: 1 N/A N/A Wound Status: Open N/A N/A Wound Recurrence: No N/A N/A Measurements L x W x D (cm) 6x15x0.1 N/A N/A Area (cm) : 70.686 N/A N/A Volume (cm) : 7.069 N/A N/A % Reduction in Area: 24.40% N/A N/A % Reduction in Volume: 24.40% N/A N/A Classification: Partial Thickness N/A N/A Exudate Amount: Large N/A N/A Exudate Type: Serosanguineous N/A N/A Exudate Color: red, brown N/A N/A Wound Margin: Indistinct, nonvisible N/A N/A Granulation Amount: Small (1-33%) N/A N/A Necrotic Amount: Large (67-100%) N/A N/A Exposed Structures: Fascia: No N/A N/A Fat Layer (Subcutaneous Tissue): No Tendon: No Muscle: No Joint: No Bone: No NITYA, CAUTHON ([id number]) Epithelialization: None N/A N/A Treatment Notes Electronic Signature(s) Signed: 12/05/2021 4:42:42 PM By: [name] Entered By: [name] on 12/05/2021 12:22:10 Aguirre, Orena J. (  [id number]) -------------------------------------------------------------------------------- Multi-Disciplinary Care Plan Details Patient Name: ENIOLA, CERULLO. Date of Service: 12/05/2021 11:15 AM Medical Record Number: [id number] Patient Account Number: [id number] Date of Birth/Sex: 1945-11-28 (76 y.o. F) Treating RN: [name] Primary Care Elisheva Fallas: [name] Other Clinician: Referring Sally-Anne Wamble: [name] Treating Jhovany Weidinger/Extender: [name] in Treatment: 1 Active Inactive Necrotic Tissue Nursing Diagnoses: Impaired tissue integrity related to necrotic/devitalized tissue Knowledge deficit related to management of necrotic/devitalized tissue Goals: Necrotic/devitalized tissue will be minimized in the wound bed Date Initiated: 11/27/2021 Target Resolution Date: 12/25/2021 Goal Status: Active Patient/caregiver will verbalize  understanding of reason and process for debridement of necrotic tissue Date Initiated: 11/27/2021 Target Resolution Date: 12/25/2021 Goal Status: Active Interventions: Assess patient pain level pre-, during and post procedure and prior to discharge Provide education on necrotic tissue and debridement process Notes: Orientation to the Wound Care Program Nursing Diagnoses: Knowledge deficit related to the wound healing center program Goals: Patient/caregiver will verbalize understanding of the [place] Date Initiated: 11/27/2021 Target Resolution Date: 12/25/2021 Goal Status: Active Interventions: Provide education on orientation to the wound center Notes: Soft Tissue Infection Nursing Diagnoses: Impaired tissue integrity Knowledge deficit related to disease process and management Knowledge deficit related to home infection control: handwashing, handling of soiled dressings, supply storage Potential for infection: soft tissue Goals: Patient will remain free of wound infection Date Initiated: 11/27/2021 Target Resolution Date: 12/25/2021 Goal Status: Active Patient/caregiver will verbalize understanding of or measures to prevent infection and contamination in the home setting Date Initiated: 11/27/2021 Target Resolution Date: 12/25/2021 Goal Status: Active Patient's soft tissue infection will resolve Date Initiated: 11/27/2021 Target Resolution Date: 12/25/2021 Goal Status: Active Amber, [name] ([id number]) Signs and symptoms of infection will be recognized early to allow for prompt treatment Date Initiated: 11/27/2021 Target Resolution Date: 12/25/2021 Goal Status: Active Interventions: Assess signs and symptoms of infection every visit Provide education on infection Treatment Activities: Culture and sensitivity : 11/27/2021 Education provided on Infection : 11/27/2021 Notes: Venous Leg Ulcer Nursing Diagnoses: Actual venous Insuffiency (use after diagnosis  is confirmed) Knowledge deficit related to disease process and management Potential for venous Insuffiency (use before diagnosis confirmed) Goals: Patient will maintain optimal edema control Date Initiated: 11/27/2021 Target Resolution Date: 12/25/2021 Goal Status: Active Patient/caregiver will verbalize understanding of disease process and disease management Date Initiated: 11/27/2021 Target Resolution Date: 12/25/2021 Goal Status: Active Verify adequate tissue perfusion prior to therapeutic compression application Date Initiated: 11/27/2021 Target Resolution Date: 12/25/2021 Goal Status: Active Interventions: Provide education on venous insufficiency Treatment Activities: Therapeutic compression applied : 11/27/2021 Notes: Wound/Skin Impairment Nursing Diagnoses: Impaired tissue integrity Knowledge deficit related to smoking impact on wound healing Knowledge deficit related to ulceration/compromised skin integrity Goals: Patient/caregiver will verbalize understanding of skin care regimen Date Initiated: 11/27/2021 Target Resolution Date: 12/25/2021 Goal Status: Active Ulcer/skin breakdown will have a volume reduction of 30% by week 4 Date Initiated: 11/27/2021 Target Resolution Date: 12/25/2021 Goal Status: Active Ulcer/skin breakdown will have a volume reduction of 50% by week 8 Date Initiated: 11/27/2021 Target Resolution Date: 01/22/2022 Goal Status: Active Ulcer/skin breakdown will have a volume reduction of 80% by week 12 Date Initiated: 11/27/2021 Target Resolution Date: 02/22/2022 Goal Status: Active Ulcer/skin breakdown will heal within 14 weeks Date Initiated: 11/27/2021 Target Resolution Date: 03/08/2022 Goal Status: Active Interventions: Assess ulceration(s) every visit Provide education on ulcer and skin care Amber, [name] ([id number]) Treatment Activities: Skin care regimen initiated : 11/27/2021 Topical wound management initiated : 11/27/2021 Notes: Electronic  Signature(s)  Signed: 12/05/2021 4:42:42 PM By: [name] Entered By: [name] on 12/05/2021 12:21:49 Amber, [name] ([id number]) -------------------------------------------------------------------------------- Pain Assessment Details Patient Name: Amber, [name]. Date of Service: 12/05/2021 11:15 AM Medical Record Number: [id number] Patient Account Number: [id number] Date of Birth/Sex: 08-Aug-1946 (76 y.o. F) Treating RN: [name] Primary Care Morgyn Marut: [name] Other Clinician: Referring Ebrahim Deremer: [name] Treating Sherise Geerdes/Extender: [name] in Treatment: 1 Active Problems Location of Pain Severity and Description of Pain Patient Has Paino Yes Site Locations Rate the pain. Current Pain Level: 8 Pain Management and Medication Current Pain Management: Notes right leg Electronic Signature(s) Signed: 12/05/2021 4:42:42 PM By: [name] Entered By: [name] on 12/05/2021 11:56:38 [place], [name] ([id number]) -------------------------------------------------------------------------------- Patient/Caregiver Education Details Patient Name: JEREMY, MCLAMB. Date of Service: 12/05/2021 11:15 AM Medical Record Number: [id number] Patient Account Number: [id number] Date of Birth/Gender: 1946/02/18 (76 y.o. F) Treating RN: [name] Primary Care Physician: [name] Other Clinician: Referring Physician: Ria [name] Treating Physician/Extender: [name] in Treatment: 1 Education Assessment Education Provided To: Patient Education Topics Provided Infection: Handouts: Infection Prevention and Management Methods: Explain/Verbal Responses: State content correctly Wound/Skin Impairment: Handouts: Caring for Your Ulcer Methods: Explain/Verbal Responses: State content correctly Electronic Signature(s) Signed: 12/05/2021 4:42:42 PM By: [name] Entered By: [name] on 12/05/2021  12:43:24 Scheid, [name] ([id number]) -------------------------------------------------------------------------------- Wound Assessment Details Patient Name: Amber, [name]. Date of Service: 12/05/2021 11:15 AM Medical Record Number: [id number] Patient Account Number: [id number] Date of Birth/Sex: 12/25/1945 (76 y.o. F) Treating RN: [name] Primary Care Harris Penton: [name] Other Clinician: Referring Quill Grinder: [name] Treating Kirston Luty/Extender: [name] Weeks in Treatment: 1 Wound Status Wound Number: 15 Primary Lymphedema Etiology: Wound Location: Right, Circumferential Lower Leg Secondary Venous Leg Ulcer Wounding Event: Gradually Appeared Etiology: Date Acquired: 10/27/2021 Wound Open Weeks Of Treatment: 1 Status: Clustered Wound: No Comorbid Cataracts, Asthma, Sleep Apnea, Deep Vein Thrombosis, History: Hypertension, Peripheral Venous Disease, Osteoarthritis, Received Chemotherapy, Received Radiation Photos Wound Measurements Length: (cm) 6 % Re Width: (cm) 15 % Re Depth: (cm) 0.1 Epit Area: (cm) 70.686 Tun Volume: (cm) 7.069 Und duction in Area: 24.4% duction in Volume: 24.4% helialization: None neling: No ermining: No Wound Description Classification: Partial Thickness Foul Wound Margin: Indistinct, nonvisible Slou Exudate Amount: Large Exudate Type: Serosanguineous Exudate Color: red, brown Odor After Cleansing: No gh/Fibrino Yes Wound Bed Granulation Amount: Small (1-33%) Exposed Structure Necrotic Amount: Large (67-100%) Fascia Exposed: No Necrotic Quality: Adherent Slough Fat Layer (Subcutaneous Tissue) Exposed: No Tendon Exposed: No Muscle Exposed: No Joint Exposed: No Bone Exposed: No Treatment Notes Wound #15 (Lower Leg) Wound Laterality: Right, Circumferential Cleanser Soap and Water Amber, [name] ([id number]) Discharge Instruction: Gently cleanse wound with antibacterial soap, rinse and pat dry prior to  dressing wounds Peri-Wound Care Topical Triamcinolone Acetonide Cream, 0.1%, 15 (g) tube Discharge Instruction: Apply as directed by Ytzel Gubler. apply to wound and leg Primary Dressing Xtrasorb Classic Super Absorbent Dressing, 6x9 (in/in) Discharge Instruction: over open areas of the leg that are weeping Secondary Dressing Secured With Compression Wrap Unna-Z Zinc and Calamine Boot, 4x10 (in/yd) Discharge Instruction: Use to anchor wrap in place. Apply one strip around leg 3-finger widths below the knee to secure the wrap. Profore Lite LF 3 Multilayer Compression Bandaging System Discharge Instruction: Apply 3 multi-layer wrap as prescribed. Compression Stockings Add-Ons Electronic Signature(s) Signed: 12/05/2021 4:42:42 PM By: [name] Entered By: [name] on 12/05/2021 12:09:46 Amber, [name] ([id number]) -------------------------------------------------------------------------------- Vitals Details Patient Name: Amber, POTHIER  J. Date of Service: 12/05/2021 11:15 AM Medical Record Number: [id number] Patient Account Number: [id number] Date of Birth/Sex: 08/02/1946 (76 y.o. F) Treating RN: [name] Primary Care Cody Oliger: [name] Other Clinician: Referring Jnyah Brazee: [name] Treating Mariaceleste Herrera/Extender: [name] in Treatment: 1 Vital Signs Time Taken: 11:54 Temperature (F): 98.2 Height (in): 62 Pulse (bpm): 94 Weight (lbs): 199 Respiratory Rate (breaths/min): 18 Body Mass Index (BMI): 36.4 Blood Pressure (mmHg): 168/96 Reference Range: 80 - 120 mg / dl Notes pt asymptomatic, pt states takes BP in the evening and she states she took her BP med last night. PA Stone made aware Electronic Signature(s) Signed: 12/05/2021 4:42:42 PM By: [name] Entered By: [name] on 12/05/2021 11:56:11

## 2021-12-05 NOTE — Progress Notes (Addendum)
[name] ([id number]) Visit Report for 12/05/2021 Chief Complaint Document Details Patient Name: [name], [name]. Date of Service: 12/05/2021 11:15 AM Medical Record Number: [id number] Patient Account Number: [id number] Date of Birth/Sex: 1945-11-16 (76 y.o. F) Treating RN: Levora Dredge Primary Care Provider: [name] Other Clinician: Referring Provider: [name] Treating Provider/Extender: [name] in Treatment: 1 Information Obtained from: Patient Chief Complaint Right LE Ulcer Electronic Signature(s) Signed: 12/05/2021 11:29:23 AM By: Worthy Keeler PA-C Entered By: Worthy Keeler on 12/05/2021 11:29:23 [name], [name] ([id number]) -------------------------------------------------------------------------------- HPI Details Patient Name: [name], [name]. Date of Service: 12/05/2021 11:15 AM Medical Record Number: [id number] Patient Account Number: [id number] Date of Birth/Sex: 03-19-1946 (76 y.o. F) Treating RN: Levora Dredge Primary Care Provider: [name] Other Clinician: Referring Provider: [name] Treating Provider/Extender: [name] in Treatment: 1 History of Present Illness HPI Description: Pleasant 76 year old with history of chronic venous insufficiency. No diabetes or peripheral vascular disease. Left ABI 1.29. Questionable history of left lower extremity DVT. She developed a recurrent ulceration on her left lateral calf in December 2015, which she attributes to poor diet and subsequent lower extremity edema. She underwent endovenous laser ablation of her left greater saphenous vein in 2010. She underwent laser ablation of accessory branch of left GSV in April 2016 by Dr. Kellie Simmering at [facility]. She was previously wearing Unna boots, which she tolerated well. Tolerating 2 layer compression and cadexomer iodine. She returns to clinic for follow-up and is without new complaints. She denies any significant pain at  this time. She reports persistent pain with pressure. No claudication or ischemic rest pain. No fever or chills. No drainage. READMISSION 11/13/16; this is a 76 year old woman who is not a diabetic. She is here for a review of a painful area on her left medial lower extremity. I note that she was seen here previously last year for wound I believe to be in the same area. At that time she had undergone previously a left greater saphenous vein ablation by Dr. Kellie Simmering and she had a ablation of the anterior accessory branch of the left greater saphenous vein in March 2016. Seeing that the wound actually closed over. In reviewing the history with her today the ulcer in this area has been recurrent. She describes a biopsy of this area in 2009 that only showed stasis physiology. She also has a history of today malignant melanoma in the right shoulder for which she follows with Dr. 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 Do not see that she has ever had arterial studies in the left leg. She tells me she has a follow-up with Dr. Kellie Simmering in roughly 10 days In any case she developed the reopening of this area roughly a month ago. On the background of this she describes rapidly increasing edema which has responded to Lasix 40 mg and metolazone 2.5 mg as well as the patient's lymph massage. She has been told she has both venous insufficiency and lymphedema but she cannot tolerate compression stockings 11/28/16; the patient saw Dr. Kellie Simmering recently. Per the patient he did arterial Dopplers in the office that did not show evidence of arterial insufficiency, per the patient he stated "treat this like an ordinary venous ulcer". She also saw her dermatologist Dr. Ronnald Ramp who felt that this was more of a vascular ulcer. In general things are improving although she  arrives today with increasing bilateral lower  extremity edema with weeping a deeper fluid through the wound on the left medial leg compatible with some degree of lymphedema 12/04/16; the patient's wound is fully epithelialized but I don't think fully healed. We will do another week of depression with Promogran and TCA however I suspect we'll be able to discharge her next week. This is a very unusual-looking wound which was initially a figure-of-eight type wound lying on its side surrounded by petechial like hemorrhage. She has had venous ablation on this side. She apparently does not have an arterial issue per Dr. Kellie Simmering. She saw her dermatologist thought it was "vascular". Patient is definitely going to need ongoing compression and I talked about this with her today she will go to elastic therapy after she leaves here next week 12/11/16; the patient's wound is not completely closed today. She has surrounding scar tissue and in further discussion with the patient it would appear that she had ulcers in this area in 2009 for a prolonged period of time ultimately requiring a punch biopsy of this area that only showed venous insufficiency. I did not previously pickup on this part of the history from the patient. 12/18/16; the patient's wound is completely epithelialized. There is no open area here. She has significant bilateral venous insufficiency with secondary lymphedema to a mild-to-moderate degree she does not have compression stockings.. She did not say anything to me when I was in the room, she told our intake nurse that she was still having pain in this area. This isn't unusual recurrent small open area. She is going to go to elastic therapy to obtain compression stockings. 12/25/16; the patient's wound is fully epithelialized. There is no open area here. The patient describes some continued episodic discomfort in this area medial left calf. However everything looks fine and healed here. She is been to elastic therapy and caught herself 15-20 mmHg  stockings, they apparently were having trouble getting 20-30 mm stockings in her size 01/22/17; this is a patient we discharged from the clinic a month ago. She has a recurrent open wound on her medial left calf. She had 15 mm support stockings. I told her I thought she needed 20-30 mm compression stockings. She tells me that she has been ill with hospitalization secondary to asthma and is been found to have severe hypokalemia likely secondary to a combination of Lasix and metolazone. This morning she noted blistering and leaking fluid on the posterior part of her left leg. She called our intake nurse urgently and we was saw her this afternoon. She has not had any real discomfort here. I don't know that she's been wearing any stockings on this leg for at least 2-3 days. ABIs in this clinic were 1.21 on the right and 1.3 on the left. She is previously seen vascular surgery who does not think that there is a peripheral arterial issue. 01/30/17; Patient arrives with no open wound on the left leg. She has been to elastic therapy and obtained 20-2mhg below knee stockings and she has one on the right leg today. READMISSION 02/19/18; this PMachois a now 76year old patient we've had in this clinic perhaps 3 times before. I had last looked at her from January 07 December 2016 with an area on the medial left leg. We discharged her on 12/25/16 however she had to be readmitted on 01/22/17 with a recurrence. I have in my notes that we discharged her on 20-30 mm stockings although she tells me  she was only wearing support hose because she cannot get stockings on predominantly related to her cervical spine surgery/issues. She has had previous ablations done by vein and vascular in [place] including a great saphenous vein ablation on the left with an anterior accessory branch ablation I think both of these were in 2016. On one of the previous visit she had a biopsy noted 2009 that was negative. She is not felt to  have an arterial issue. She is not a diabetic. She does have a history of obstructive sleep apnea hypertension asthma as well as chronic venous insufficiency and lymphedema. On this occasion she noted 2 dry scaly patch on her left leg. She tried to put lotion on this it didn't really help. There were 2 open areas.the patient has been seeing her primary physician from 02/05/18 through 02/14/18. She had Unna boots applied. The superior wound now on the lateral left leg has closed but she's had one wound that remains open on the lateral left leg. This is not the same spot as we dealt with in 2018. ABIs in this clinic were 1.3 bilaterally [name], [name] ([id number]) 02/26/18; patient has a small wound on the left lateral calf. Dimensions are down. She has chronic venous insufficiency and lymphedema. 03/05/18; small open area on the left lateral calf. Dimensions are down. Tightly adherent necrotic debris over the surface of the wound which was difficult to remove. Also the dressing [over collagen] stuck to the wound surface. This was removed with some difficulty as well. Change the primary dressing to Hydrofera Blue ready 03/12/18; small open area on the left lateral calf. Comes in with tightly adherent surface eschar as well as some adherent Hydrofera Blue. 03/19/18; open area on the left lateral calf. Again adherent surface eschar as well as some adherent Hydrofera Blue nonviable subcutaneous tissue. She complained of pain all week even with the reduction from 4-3 layer compression I put on last week. Also she had an increase in her ankle and calf measurements probably related to the same thing. 03/26/18; open area on the left lateral calf. A very small open area remains here. We used silver alginate starting last week as the Hydrofera Blue seem to stick to the wound bed. In using 4-layer compression 04/02/18; the open area in the left lateral calf at some adherent slough which I removed there is no open area  here. We are able to transition her into her own compression stocking. Truthfully I think this is probably his support hose. However this does not maintain skin integrity will be limited. She cannot put over the toe compression stockings on because of neck problems hand problems etc. She is allergic to the lining layer of juxta lites. We might be forced to use extremitease stocking should this fail READMIT 11/24/2018 Patient is now a 76 year old woman who is not a diabetic. She has been in this clinic on at least 3 previous occasions largely with recurrent wounds on her left leg secondary to chronic venous insufficiency with secondary lymphedema. Her situation is complicated by inability to get stockings on and an allergy to neoprene which is apparently a component and at least juxta lites and other stockings. As a result she really has not been wearing any stockings on her legs. She tells [place] that roughly 2 or 3 weeks ago she started noticing a stinging sensation just above her ankle on the left medial aspect. She has been diagnosed with pseudogout and she wondered whether this was what she was  experiencing. She tried to dress this with something she bought at the store however subsequently it pulled skin off and now she has an open wound that is not improving. She has been using Vaseline gauze with a cover bandage. She saw her primary doctor last week who put an Haematologist on her. ABIs in this clinic was 1.03 on the left 2/12; the area is on the left medial ankle. Odd-looking wound with what looks to be surface epithelialization but a multitude of small petechial openings. This clearly not closed yet. We have been using silver alginate under 3 layer compression with TCA 2/19; the wound area did not look quite as good this week. Necrotic debris over the majority of the wound surface which required debridement. She continues to have a multitude of what looked to be small petechial openings. She  reminds [place] that she had a biopsy on this initially during her first outbreak in 2015 in [place] dermatology. She expresses concern about this being a possible melanoma. 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 I assured her that this does not look anything like melanoma. Except for the petechial reaction it does look like a venous insufficiency area and she certainly has evidence of this on both sides 2/26; a difficult area on the left medial ankle. The patient clearly has chronic venous hypertension with some degree of lymphedema. The odd thing about the area is the small petechial hemorrhages. I am not really sure how to explain this. This was present last time and this is not a compression injury. We have been using Hydrofera Blue which I changed to last week 3/4; still using Hydrofera Blue. Aggressive debridement today. She does not have known arterial issues. She has seen Dr. Kellie Simmering at [facility] vein and vascular and and has an ablation on the left. [Anterior accessory branch of the greater saphenous]. From what I remember they did not feel she had an arterial issue. The patient has had this area biopsied in 2009 at [facility] dermat[facility] and by her recollection they said this was "stasis". She is also follow-up with dermatology locally who thought that this was more of a vascular issue 3/11; using Hydrofera Blue. Aggressive debridement today. She does not have an arterial issue. We are using 3 layer compression although we may need to go to 4. The patient has been in for multiple changes to her wrap since I last saw her a week ago. She says that the area was leaking. I do not have too much more information on what was found 01/19/19 on evaluation today patient was actually being seen for a nurse visit when unfortunately she had the area on her left lateral lower extremity as well as weeping from the  right lower extremity that became apparent. Therefore we did end up actually seeing her for a full visit with myself. She is having some pain at this site as well but fortunately nothing too significant at this point. No fevers, chills, nausea, or vomiting noted at this time. 3/18-Patient is back to the clinic with the left leg venous leg ulcer, the ulcer is larger in size, has a surface that is densely adherent with fibrinous tissue, the Hydrofera Blue was used but is densely adherent and there was difficulty in removing it. The right lower extremity was also wrapped for weeping edema. Patient has a new area over the left lateral foot above the malleolus that is  small and appears to have no debris with intact surrounding skin. Patient is on increased dose of Lasix also as a means to edema management 3/25; the patient has a nonhealing venous ulcer on the medial left leg and last week developed a smaller area on the lateral left calf. We have been using Hydrofera Blue with a contact layer. 4/1; no major change in these wounds areas. Left medial and more recently left lateral calf. I tried Iodoflex last week to aid in debridement she did not tolerate this. She stated her pain was terrible all week. She took the top layer of the 4 layer compression off. 4/8; the patient actually looks somewhat better in terms of her more prominent left lateral calf wound. There is some healthy looking tissue here. She is still complaining of a lot of discomfort. 4/15; patient in a lot of pain secondary to sciatica. She is on a prednisone taper prescribed by her primary physician. She has the 2 areas one on the left medial and more recently a smaller area on the left lateral calf. Both of these just above the malleoli 4/22; her back pain is better but she still states she is very uncomfortable and now feels she is intolerant to the [employer]. No real change in the wounds we have been using Sorbact. She has been  previously intolerant to Iodoflex. There is not a lot of option about what we can use to debride this wound under compression that she no doubt needs. sHe states Ultram no longer works for her pain 4/29; no major change in the wounds slightly increased depth. Surface on the original medial wound perhaps somewhat improved however the more recent area on the lateral left ankle is 100% covered in very adherent debris we have been using Sorbact. She tolerates 4 layer compression well and her edema control is a lot better. She has not had to come in for a nurse check 5/6; no major change in the condition of the wounds. She did consent to debridement today which was done with some difficulty. Continuing Sorbact. She did not tolerate Iodoflex. She was in for a check of her compression the day after we wrapped her last week this was adjusted but nothing much was found 5/13; no major change in the condition or area of the wounds. I was able to get a fairly aggressive debridement done on the lateral left leg wound. Even using Sorbact under compression. She came back in on Friday to have the wrap changed. She says she felt uncomfortable on the lateral aspect of her ankle. She has a long history of chronic venous insufficiency including previous ablation surgery on this side. 5/20-Patient returns for wounds on left leg with both wounds covered in slough, with the lateral leg wound larger in size, she has been in 3 layer compression and felt more comfortable, she describes pain in ankle, in leg and pins and needles in foot, and is about to try Pamelor for this 6/3; wounds on the left lateral and left medial leg. The area medially which is the most recent of the 2 seems to have had the largest increase in [place], [name]. ([id number]) dimensions. We have been using Sorbac to try and debride the surface. She has been to see orthopedics they apparently did a plain x-ray that was indeterminant. Diagnosed her with  neuropathy and they have ordered an MRI to determine if there is underlying osteomyelitis. This was not high on my thought list but I suppose  it is prudent. We have advised her to make an appointment with vein and vascular in [place]. She has a history of a left greater saphenous and accessory vein ablations I wonder if there is anything else that can be done from a surgical point of view to help in these difficult refractory wounds. We have previously healed this wound on one occasion but it keeps on reopening [medial side] 6/10; deep tissue culture I did last week I think on the left medial wound showed both moderate E. coli and moderate staph aureus [MSSA]. She is going to require antibiotics and I have chosen Augmentin. We have been using Sorbact and we have made better looking wound surface on both sides but certainly no improvement in wound area. She was back in last Friday apparently for a dressing changes the wrap was hurting her outer left ankle. She has not managed to get a hold of vein and vascular in [place]. We are going to have to make her that appointment 6/17; patient is tolerating the Augmentin. She had an MRI that I think was ordered by orthopedic surgeon this did not show osteomyelitis or an abscess did suggest cellulitis. We have been using Sorbact to the lateral and medial ankles. We have been trying to arrange a follow-up appointment with vein and vascular in [place] or did her original ablations. We apparently an area sent the request to vein and vascular in [facility] 6/24; patient has completed the Augmentin. We do not yet have a vein and vascular appointment in [place]. I am not sure what the issue is here we have asked her to call tomorrow. We are using Sorbact. Making some improvements and especially the medial wound. Both surfaces however look better medial and lateral. 7/1; the patient has been in contact with vein and vascular in [place] but has not yet  received an appointment. Using Sorbact we have gradually improve the wound surface with no improvement in surface area. She is approved for Apligraf but the wound surface still is not completely viable. She has not had to come in for a dressing change 7/8; the patient has an appointment with vein and vascular on 7/31 which is a Friday afternoon. She is concerned about getting back here for [place] to dress her wounds. I think it is important to have them goal for her venous reflux/history of ablations etc. to see if anything else can be done. She apparently tested positive for 1 of the blood tests with regards to lupus and saw a rheumatologist. He has raised the issue of vasculitis again. I have had this thought in the past however the evidence seems overwhelming that this is a venous reflux etiology. If the rheumatologist tells me there is clinical and laboratory investigation is positive for lupus I will rethink this. 7/15; the patient's wound surfaces are quite a bit better. The medial area which was her original wound now has no depth although the lateral wound which was the more recent area actually appears larger. Both with viable surfaces which is indeed better. Using Sorbact. I wanted to use Apligraf on her however there is the issue of the vein and vascular appointment on 7/31 at 2:00 in the afternoon which would not allow her to get back to be rewrapped and they would no doubt remove the graft 7/22; the patient's wound surfaces have moderate amount of debris although generally look better. The lateral one is larger with 2 small satellite areas superiorly. We are waiting for her vein and  vascular appointment on 7/31. She has been approved for Apligraf which I would like to use after th 7/29; wound surfaces have improved no debridement is required we have been using Sorbact. She sees vein and vascular on Friday with this so question of whether anything can be done to lessen the likelihood of  recurrence and/or speed the healing of these areas. She is already had previous ablations. She no doubt has severe venous hypertension 8/5-Patient returns at 1 week, she was in [place] for 3 days by her podiatrist, we have been using so backed to the wound, she has increased pain in both the wounds on the left lower leg especially the more distal one on the lateral aspect 8/12-Patient returns at 1 week and she is agreeable to having debridement in both wounds on her left leg today. We have been using Sorbact, and vascular studies were reviewed at last visit 8/19; the patient arrives with her wounds fairly clean and no debridement is required. We have used Sorbact which is really done a nice job in cleaning up these very difficult wound surfaces. The patient saw Dr. Donzetta Matters of vascular surgery on 7/31. He did not feel that there was an arterial component. He felt that her treated greater saphenous vein is adequately addressed and that the small saphenous vein did not appear to be involved significantly. She was also noted to have deep venous reflux which is not treatable. Dr. Donzetta Matters mentioned the possibility of a central obstructive component leading to reflux and he offered her central venography. She wanted to discuss this or think about it. I have urged her to go ahead with this. She has had recurrent difficult wounds in these areas which do heal but after months in the clinic. If there is anything that can be done to reduce the likelihood of this I think it is worth it. 9/2 she is still working towards getting follow-up with Dr. Donzetta Matters to schedule her CT. Things are quite a bit worse venography. I put Apligraf on 2 weeks ago on both wounds on the medial and lateral part of her left lower leg. She arrives in clinic today with 3 superficial additional wounds above the area laterally and one below the wound medially. She describes a lot of discomfort. I think these are probably wrapped injuries. Does not  look like she has cellulitis. 07/20/2019 on evaluation today patient appears to be doing somewhat poorly in regard to her lower extremity ulcers. She in fact showed signs of erythema in fact we may even be dealing with an infection at this time. Unfortunately I am unsure if this is just infection or if indeed there may be some allergic reaction that occurred as a result of the Apligraf application. With that being said that would be unusual but nonetheless not impossible in this patient is one who is unfortunately allergic to quite a bit. Currently we have been using the Sorbact which seems to do as well as anything for her. I do think we may want to obtain a culture today to see if there is anything showing up there that may need to be addressed. 9/16; noted that last week the wounds look worse in 1 week follow-up of the Apligraf. Using Sorbact as of 2 days ago. She arrives with copious amounts of drainage and new skin breakdown on the back of the left calf. The wounds arm more substantial bilaterally. There is a fair amount of swelling in the left calf no overt DVT there  is edema present I think in the left greater than right thigh. She is supposed to go on 9/28 for CT venography. The wounds on the medial and lateral calf are worse and she has new skin breakdown posteriorly at least new for me. This is almost developing into a circumferential wound area The Apligraf was taken off last week which I agree with things are not going in the right direction a culture was done we do not have that back yet. She is on Augmentin that she started 2 days ago 9/23; dressing was changed by her nurses on Monday. In general there is no improvement in the wound areas although the area looks less angry than last week. She did get Augmentin for MSSA cultured on the 14th. She still appears to have too much swelling in the left leg even with 3 layer compression 9/30; the patient underwent her procedure on 9/28 by Dr.  Donzetta Matters at [facility]. She was discovered to have the common iliac vein measuring 12.2 mm but at the level of L4-L5 measured 3 mm. After stenting it measured 10 mm. 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 Rouleaux flow in the common femoral and femoral vein was observed much improved after stenting. We are using silver alginate to the wounds on the medial and lateral ankle on the left. 4 layer compression 10/7; the patient had fluid swelling around her knee and 4 layer compression. At the advice of vein and vascular this was reduced to 3 layer which she is tolerating better. We have been using silver alginate under 3 layer compression since last Friday 10/14; arrives with the areas on the left ankle looking a lot better. Inflammation in the area also a lot better. She came in for a nurse check on 10/9 10/21; continued nice improvement. Slight improvements in surface area of both the medial and lateral wounds on the left. A lot of the satellite lesions in the weeping erythema around these from stasis dermatitis is resolved. We have been using silver alginate [name], [name] ([id number]) 10/28; general improvement in the entire wound areas although not a lot of change in dimensions the wound certainly looks better. There is a lot less in terms of venous inflammation. Continue silver alginate this week however look towards Hydrofera Blue next week 11/4; very adherent debris on the medial wound left wound is not as bad. We have been using silver alginate. Change to [facility] today 11/11; very adherent debris on both wound areas. She went to vein and vascular last week and follow-up they put in [place] boot on this today. He says the [facility] was adherent. Wound is definitely not as good as last week. Especially on the left there the satellite lesions look more prominent 11/18; absolutely no better. erythema on lateral aspect with tenderness. 09/30/2019 on  evaluation today patient appears to actually be doing better. Dr. Dellia Nims did put her on doxycycline last week which I do believe has helped her at this point. Fortunately there is no signs of active infection at this time. No fevers, chills, nausea, vomiting, or diarrhea. I do believe he may want extend the doxycycline for 7 additional days just to ensure everything does completely cleared up the patient is in agreement with that plan. Otherwise she is going require some sharp debridement today 12/2; patient is completing a 2-week course of doxycycline. I gave her this empirically for inflammation as well as infection when I last saw her  2 weeks ago. All of this seems to be better. She is using silver alginate she has the area on the medial aspect of the larger area laterally and the 2 small satellite regions laterally above the major wound. 12/9; the patient's wound on the left medial and left lateral calf look really quite good. We have been using silver alginate. She saw vein and vascular in follow-up on 10/09/2019. She has had a previous left greater saphenous vein ablation by Dr. Oscar La in 2016. More recently she underwent a left common iliac vein stent by Dr. 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 The swelling is improved and certainly the wounds have improved. The patient shows [place] today area on the right medial calf there is almost no wound but leaking lymphedema. She says she start this started 3 or 4 days ago. She did not traumatize it. It is not painful. She does not wear compression on that side 12/16; the patient continues to do well laterally. Medially still requiring debridement. The area on the right calf did not materialize to anything and is not currently open. We wrapped this last time. She has support stockings for that leg although I am not sure they are going to provide adequate compression 12/23; the lateral wound looks stable. Medially still requiring debridement for  tightly adherent fibrinous debris. We've been using silver alginate. Surface area not any different 12/30; neither wound is any better with regards to surface and the area on the left lateral is larger. I been using silver alginate to the left lateral which look quite good last week and Sorbact to the left medial 11/11/2019. Lateral wound area actually looks better and somewhat smaller. Medial still requires a very aggressive debridement today. We have been using Sorbact on both wound areas 1/13; not much better still adherent debris bilaterally. I been using Sorbact. She has severe venous hypertension. Probably some degree of dermal fibrosis distally. I wonder whether tighter compression might help and I am going to try that today. We also need to work on the bioburden 1/20; using Sorbact. She has severe venous hypertension status post stent placement for pelvic vein compression. We applied gentamicin last time to see if we could reduce bioburden I had some discussion with her today about the use of pentoxifylline. This is occasionally used in this setting for wounds with refractory venous insufficiency. However this interacts with Plavix. She tells me that she was put on this after stent placement for 3 months. She will call Dr. Claretha Cooper office to discuss 1/27; we are using gentamicin under Sorbact. 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 She has a stent. Wound medially is measuring smaller this week. Laterally measuring slightly larger although she has some satellite lesions superiorly 2/3; gentamicin under Sorbact under 4-layer compression. 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 She has a stent on Plavix. Her wounds are measuring smaller this week. More substantially laterally where there is a satellite lesion superiorly. 2/10; gentamicin under Sorbac. 4-layer compression. Patient communicated with Dr. Donzetta Matters at vein and vascular in  [place]. He is okay with the patient coming off Plavix I will therefore start her on pentoxifylline for a 1 month trial. In general her wounds look better today. I had some concerns about swelling in the left thigh however she measures 61.5 on the right and 63 on the mid thigh which does not suggest there is any difficulty. The patient is not describing any pain. 2/17; gentamicin under  Sorbac 4-layer compression. She has been on pentoxifylline for 1 week and complains of loose stool. No nausea she is eating and drinking well 2/24; the patient apparently came in 2 days ago for a nurse visit when her wrap fell down. Both areas look a little worse this week macerated medially and satellite lesions laterally. Change to silver alginate today 3/3; wounds are larger today especially medially. She also has more swelling in her foot lower leg and I even noted some swelling in her posterior thigh which is tender. I wonder about the patency of her stent. Fortuitously she sees Dr. Claretha Cooper group on Friday 3/10; Mrs. Guerrier was seen by vein and vascular on 3/5. The patient underwent ultrasound. There was no evidence of thrombosis involving the IVC no evidence of thrombosis involving the right common iliac vein there is no evidence of thrombosis involving the right external iliac vein the left external vein is also patent. The right common iliac vein stent appears patent bilateral common femoral veins are compressible and appear patent. I was concerned about the left common iliac stent however it looks like this is functional. She has some edema in the posterior thigh that was tender she still has that this week. I also note they had trouble finding the pulses in her left foot and booked her for an ABI baseline in 4 weeks. She will follow up in 6 months for repeat IVC duplex. The patient stopped the pentoxifylline because of diarrhea. It does not look like that was being effective in any case. I have advised her  to go back on her aspirin 81 mg tablet, vascular it also suggested this 3/17; comes in today with her wound surfaces a lot better. The excoriations from last week considerably better probably secondary to the TCA. We have been using silver alginate 3/24; comes in today with smaller wounds both medially and laterally. Both required debridement. There are 2 small satellite areas superiorly laterally. She also has a very odd bandlike area in the mid calf almost looking like there was a weakness in the wrap in a localized area. I would write this off as being this however anteriorly she has a small raised ballotable area that is very tender almost reminiscent of an abscess but there was no obvious purulent surface to it. 02/04/20 upon evaluation today patient appears to be doing fairly well in regard to her wounds today. Fortunately there is no signs of active infection at this time. No fevers, chills, nausea, vomiting, or diarrhea. She has been tolerating the dressing changes without complication. Fortunately I feel like she is showing signs of improvement although has been sometime since have seen her. Nonetheless the area of concern that Dr. Dellia Nims had last week where she had possibly an area of the wrap that was we can allow the leg to bulge appears to be doing significantly better today there is no signs of anything worsening. MIRABEL, AHLGREN ([id number]) 4/7; the patient's wounds on her medial and lateral left leg continue to contract. We have been using a regular alginate. Last week she developed an area on the right medial lower leg which is probably a venous ulcer as well. 4/14; the wounds on her left medial and lateral lower leg continue to contract. Surface eschar. We have been using regular alginate. The area on the right medial lower leg is closed. We have been putting both legs under 4-layer contraction. The patient went back to see vein and vascular she had arterial studies done  which  were apparently "quite good" per the patient although I have not read their notes I have never felt she had an arterial issue. 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 This is been longstanding and refractory to exercise, leg elevation and longstanding use of compression wraps in our clinic as well as compression stockings on the times we have been able to get these to heal 4/21; we thought she actually might be close this week however she arrives in clinic with a lot of edema in her upper left calf and into her posterior thigh. This is been an intermittent problem here. She says the wrap fell down but it was replaced with a nurse visit on Monday. We are using calcium alginate to the wounds and the wound sizes there not terribly larger than last week but there is a lot more edema 4/28; again wound edges are smaller on both sides. Her edema is better controlled than last time. She is obtained her compression pumps from [employer] although they have not been to her home to set these up. 5/5; left medial and left lateral both look stable. I am not sure the medial is any smaller. We have been using calcium alginate under 4-layer compression. oShe had an area on the right medial. This was eschared today. We have been wrapping this as well. She does not tolerate external compression stockings due to a history of various contact allergies. She has her compression pumps however the representative from the company is coming on her to show her how to use these tomorrow 5/19; patient with severe chronic venous insufficiency secondary to central venous disease. She had a stent placed in her left common iliac vein. She has done better since but still difficult to control wounds. She comes in today with nothing open on the right leg. Her areas on the left medial and left lateral are just about closed. We are using calcium alginate under 4-layer compression.  She is using her external compression pumps at home She only has 15-20 support stockings. States she cannot get anything tighter than that on. 03/30/20-Patient returns at 1 week, the wounds on the left leg are both slightly bigger, the last week she was on 3 layer compression which started to slide down. She is starting to use her lymphedema pumps although she stated on 1 day her right ankle started to swell up and she have to stop that day. Unfortunately the open area seem to oscillate between improving to the point of healing and then flaring up all to do with effectiveness of compression or lack of due to the left leg topography not keeping the compression wraps from rolling down 6/2 patient comes in with a 15/20 mmHg stocking on the right leg. She tells me that she developed a lot of swelling in her ankles she saw orthopedics she was felt to possibly be having a flare of pseudogout versus some other type of arthritis. She was put on steroids for a respiratory issue so that helps with the inflammation. She has not been using the pumps all week. She thinks the left thigh is more swollen than usual and I would agree with that. She has an appointment with Dr. Donzetta Matters 9 days or so from now 6/9; both wounds on the left medial and left lateral are smaller. We have been using calcium alginate under compression. She does not have an open wound on the right leg she is using a stocking and her  compression pumps things are going well. She has an appointment with Dr. Donzetta Matters with regards to her stent in the left common iliac vein 6/16; the wounds on the left medial and left lateral ankle continues to contract. The patient saw Dr. Donzetta Matters and I think he seems satisfied. Ordered follow-up venous reflux studies on both sides in September. Cautioned that she may need thigh-high stockings. She has been using calcium alginate under compression on the left and her own stocking on the right leg. She tells [place] there are no open  wounds on the right 6/23; left lateral is just about closed. Medial required debridement today. We have been using calcium alginate. Extensive discussion about the compression pumps she is only using these on 25 mmHg states she could not take 40 or 30 when the wrap came out to her home to demonstrate these. He said they should not feel tight 6/30; the left lateral wound has a slight amount of eschar. . The area medially is about the same using Hydrofera Blue. 7/7; left lateral wound still has some eschar. I will remove this next week may be closed. The area medially is very small using Hydrofera Blue with improvement. Unfortunately the stockings fell down. Unfortunately the blisters have developed at the edge of where the wrap fell. When this happened she says her legs hurt she did not use her pumps. We are not open Monday for her to come in and change the wraps and she had an appointment yesterday. She also tells me that she is going to have an MRI of her back. She is having pain radiating into her left anterior leg she thinks her from an L5 disc. She saw Dr. Ellene Route of neurosurgery 7/14; the area on the left lateral ankle area is closed. Still a small area medially however it looks better as well. We have been using Hydrofera Blue under 4-layer compression 7/21; left lateral ankle is still closed however her wound on the medial left calf is actually larger. This is probably because Hydrofera Blue got stuck to the wound. She came in for a nurse change on Friday and will do that again this week I was concerned about the amount of swelling that she had last week however she is using her compression pumps twice a day and the swelling seems well controlled 7/28; remaining wound on the left medial lower leg is smaller. We have been using moistened silver collagen under compression she is coming back for a nurse visit. For reasons that were not really clear she was just keeping her legs elevated and not  using her compression pumps. I have asked her to use the compression pumps. She does not have any wounds on the right leg 06/15/20-Patient returns at 2 weeks, her LLE edema is worse and she developed a blister wound that is new and has bigger posterior calf wound on right, we are using Prisma with pad, 4 layer compression. she has been on lasix 40 mg daily 8/18; patient arrives today with things a lot worse than I remember from a few weeks ago. She was seen last week. Noted that her edema was worse and that she now had a left lateral wound as well as deteriorating edema in the medial and posterior part of the lower leg. She says she is using his or her external compression pumps once a day although I wonder about the compliance. 8/25; weeping area on the right medial lower leg. This had actually gotten a small localized area  of her compression stocking wet. oOn the left side there is a large denuded area on the posterior medial lower leg and smaller area on the lateral. This was not the original areas that we dealt with. 9/1 the patient's wound on the left leg include the left lateral and left posterior. Larger superficial wounds weeping. She has very poor edema control. Tender localized edema in the left lower medial ankle/heel probably because of localized wrap issues. She freely admits she is not using the compression pumps. She has been up on her feet a lot. She thinks the hydrofera blue is contributing to the pain she is experiencing.. This is a complaint that I have occasionally heard 9/8; really not much improvement. The patient is still complaining of a lot of pain particularly when she uses compression pumps. I switched her to silver alginate last time because she found the Hydrofera Blue to be irritating. I don't hear much difference in her description with the silver alginate. 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 She has follow-up with Dr. Donzetta Matters I think for ultrasound next month YU, CRAGUN ([id number]) 9/15; quite a bit of improvement today. We have less edema and more epithelialization in both of her wound areas on the left medial and left lateral calf. These are not the site of her original wounds in this area. She says she has been using her compression pumps for 30 minutes twice a day, there pain issues that never quite understood. Silver alginate as the primary dressing 9/22; continued improvement. Both areas medially and laterally still have a small open area there is some eschar. She continues to complain of left medial ankle pain. Swelling in the leg is in much better condition. We have been using silver alginate 9/29; continued improvement. Both areas medially and laterally in the left calf look as though they are close some minor surface eschar but I think this is epithelialized. She comes in today saying she has a ruptured disc at L4-L5 cannot bend over to put on her stockings. 10/6; patient comes in today with no open wounds on either leg. However her edema on the left leg in the upper one third of the lower leg is poorly controlled nonpitting. She says that she could not use the pumps for 2 days and then she has been using the last couple of days. It is not clear to me she has been able to get her stocking on. She has back problems. Mrs. Lukes has severe chronic venous insufficiency with secondary lymphedema. Her venous insufficiency is partially centrally mediated and that she is now post stent in the left common iliac vein. [facility] Thurner's syndrome/physiology]. She follows up with them on 10/15. She wears 20/30 below-knee stockings. She is supposed to use compression pumps at home although I think her compliance about with this is been less than 100%. I have asked her to use these 3 times a day. Finally I think she has lipodermatosclerosis in the left lower leg with an inverted  bottle sign. It is been a major problem controlling the edema in the left leg. The right leg we have had wounds on but not as significant a problem is on the left READMISSION 04/12/2021 Mrs. Wasser is a 76 year old woman we know well in this clinic. She has severe chronic venous insufficiency. 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 I believe she has had bilateral greater saphenous vein ablation in the past as well. She tells me that this wound opened sometime in March. She had a fall and thinks it was initially abrasion. She developed areas she describes as little blisters on the anterior part of her leg and she saw dermatology and was treated for methicillin staph aureus with several rounds of antibiotics. She has been using support stockings on the left leg and says this is the only thing she can get on. Her compression pump use maybe once a day she says if she did not use one she use the other. She comes in today with incredible swelling in the left leg with a wound on the left posterior calf. She has been using Neosporin to this previously a hydrocolloid. 6/15; patient arrives back for 1 week follow-up.Marland Kitchen Apparently her wrap fell down she did not call us to replace this. He has poor edema control. She only uses her compression pumps once a day 6/29; patient presents for 1 week follow-up. She has tolerated the compression wrap well. We have been using Iodoflex under the wrap. She has no issues or complaints today. 7/6; patient presents for 1 week follow-up. She states that the compression wrap rolled down her leg 4 days ago. She has been trying to keep the area covered but has no dressings at home to use. She denies signs of infection. 7/13; patient presents for 1 week follow-up. She states that her compression wrap rolled down her right leg and she called our office and had it placed a few days ago. She has been tolerating the current wrap well. She  states that the Iodoflex is causing a burning sensation. She denies signs of infection. 7/27; patient presents for 1 week follow-up She has tolerated the compression wrap well with Hydrofera Blue underneath. She has now developed a wound to her right lower extremity. She reports having a culture done To this area by her dermatologist that she reports is negative. She currently denies signs of infection. 8/30; patient presents for 1 week follow-up. On the left side she has tolerated the compression wrap well with Hydrofera Blue underneath. She reports some discomfort to her right lower extremity with the 3 layer compression. She currently denies signs of infection. 06/14/2021 upon evaluation today patient's wound actually appears to be doing decently well based on what I am seeing currently. She actually has 2 areas 1 on the left distal/posterior lower leg and the other on the right lower leg. Subsequently the measurements are roughly the same may be slightly smaller but in general have not made any trend towards getting worse which is great news. 06/23/2021 upon evaluation today patient appears to be doing pretty well in regard to her wounds. On the right she is having difficulty with sciatic pain and this subsequently has led to her taking the wrap off over the last week her leg is more swollen she is also been on prednisone for 10 days. With that being said I think we may want to just use a compression sock on the left that way she will not have to worry about the compression wrap being in place that she cannot get off. With that being said I do believe as well that the patient is going to need to continue with the wrapping on the left we will also need to do a little bit of sharp debridement today. 8/24; patient presents for 1 week follow-up. She has no issues  or complaints today. She denies signs of infection. 8/31; patient presents for 1 week follow-up. She has no issues or complaints today. She  has tolerated the compression wrap well on the left lower extremity. She denies signs of infection. 9/7; patient presents for follow-up. She reports that the compression wrap rolled down slightly on her right lower extremity. She reports tenderness to the wound bed. She denies increased warmth or erythema to the surrounding skin. She overall feels well. 9/14; patient presents for follow-up. She has no issues or complaints today. She denies signs of infection. PuraPly is available and patient would like to start this today. 9/21; patient presents for follow-up. She has no issues or complaints today. She tolerated the first skin substitute placement last week under compression. 9/28; patient presents for 1 week follow-up. She has no issues or complaints today. 10/5; patient presents for 1 week follow-up. She reports taking the wrap off yesterday. She was on her feet more yesterday and developed more swelling to her left lower extremity. She denies pain. Overall she is doing well. 10/12; patient presents for 1 week follow-up. She has no issues or complaints today. 10/19; patient presents for follow-up. She has no issues or complaints today. She tolerated the compression wrap well. She states she has compression stockings at home. LILYANAH, CELESTIN ([id number]) Readmission: 11/27/2021 upon evaluation today patient appears to be doing poorly in regard to the wounds on her right leg. She also has an area of erythema in the proximal calf area of the right leg which again I do believe is a issue from the standpoint of it being warm and erythematous to touch and also seems to be somewhat localized I really think this may be more of a cellulitis issue than anything else. Fortunately I do not see any signs of active infection Systemically at this time. 12/05/2021 upon evaluation patient's wounds actually appear to be showing some signs of improvement which is not too bad and just 1 week's time. Especially  since we had to get on the right antibiotic which is only been for the past 4 days. Fortunately I do not see any signs of active infection locally nor systemically at this time that has gotten any worse. Overall I think that again with the antibiotic she is significantly improved and currently she is taking Levaquin. Electronic Signature(s) Signed: 12/05/2021 1:18:39 PM By: Worthy Keeler PA-C Entered By: Worthy Keeler on 12/05/2021 13:18:39 Saltsman, [name] ([id number]) -------------------------------------------------------------------------------- Physical Exam Details Patient Name: ZENDAYA, GROSECLOSE. Date of Service: 12/05/2021 11:15 AM Medical Record Number: [id number] Patient Account Number: [id number] Date of Birth/Sex: 1946-10-19 (76 y.o. F) Treating RN: Levora Dredge Primary Care Provider: [name] Other Clinician: Referring Provider: [name] Treating Provider/Extender: Jeri Cos Weeks in Treatment: 1 Constitutional [name] and well-hydrated in no acute distress. Respiratory normal breathing without difficulty. Psychiatric this patient is able to make decisions and demonstrates good insight into disease process. Alert and Oriented x 3. pleasant and cooperative. Notes Upon inspection patient's wound bed showed signs of better granulation and epithelization there still was a lot of dry skin but I think that this does appear to be much less inflamed compared to last week I do believe that the Levaquin is doing an awesome job. Electronic Signature(s) Signed: 12/05/2021 1:19:03 PM By: Worthy Keeler PA-C Entered By: Worthy Keeler on 12/05/2021 13:19:03 [name], [name] ([id number]) -------------------------------------------------------------------------------- Physician Orders Details Patient Name: [name], [name]. Date of Service: 12/05/2021 11:15 AM Medical Record  Number: [id number] Patient Account Number: [id number] Date of Birth/Sex: 09/24/1946 (76  y.o. F) Treating RN: Levora Dredge Primary Care Provider: [name] Other Clinician: Referring Provider: [name] Treating Provider/Extender: [name] in Treatment: 1 Verbal / Phone Orders: No Diagnosis Coding ICD-10 Coding Code Description I89.0 Lymphedema, not elsewhere classified I87.331 Chronic venous hypertension (idiopathic) with ulcer and inflammation of right lower extremity L97.811 Non-pressure chronic ulcer of other part of right lower leg limited to breakdown of skin Follow-up Appointments o Return Appointment in 1 week. o Nurse Visit as needed Bathing/ Shower/ Hygiene o May shower with wound dressing protected with water repellent cover or cast protector. Wound Treatment Wound #15 - Lower Leg Wound Laterality: Right, Circumferential Cleanser: Soap and Water Discharge Instructions: Gently cleanse wound with antibacterial soap, rinse and pat dry prior to dressing wounds Topical: Triamcinolone Acetonide Cream, 0.1%, 15 (g) tube Discharge Instructions: Apply as directed by provider. apply to wound and leg Primary Dressing: Xtrasorb Classic Super Absorbent Dressing, 6x9 (in/in) Discharge Instructions: over open areas of the leg that are weeping Compression Wrap: Unna-Z Zinc and Calamine Boot, 4x10 (in/yd) Discharge Instructions: Use to anchor wrap in place. Apply one strip around leg 3-finger widths below the knee to secure the wrap. Compression Wrap: Profore Lite LF 3 Multilayer Compression Bandaging System Discharge Instructions: Apply 3 multi-layer wrap as prescribed. Electronic Signature(s) Signed: 12/05/2021 4:42:42 PM By: Levora Dredge Signed: 12/05/2021 5:15:34 PM By: Worthy Keeler PA-C Entered By: Levora Dredge on 12/05/2021 12:39:51 [name], [name] ([id number]) -------------------------------------------------------------------------------- Problem List Details Patient Name: [name], [name]. Date of Service: 12/05/2021 11:15  AM Medical Record Number: [id number] Patient Account Number: [id number] Date of Birth/Sex: 1946/07/13 (76 y.o. F) Treating RN: Levora Dredge Primary Care Provider: [name] Other Clinician: Referring Provider: [name] Treating Provider/Extender: [name] in Treatment: 1 Active Problems ICD-10 Encounter Code Description Active Date MDM Diagnosis I89.0 Lymphedema, not elsewhere classified 11/27/2021 No Yes I87.331 Chronic venous hypertension (idiopathic) with ulcer and inflammation of 11/27/2021 No Yes right lower extremity L97.811 Non-pressure chronic ulcer of other part of right lower leg limited to 11/27/2021 No Yes breakdown of skin Inactive Problems Resolved Problems Electronic Signature(s) Signed: 12/05/2021 11:29:15 AM By: Worthy Keeler PA-C Entered By: Worthy Keeler on 12/05/2021 11:29:14 [name], [name] ([id number]) -------------------------------------------------------------------------------- Progress Note Details Patient Name: SHATERICA, MCCLATCHY. Date of Service: 12/05/2021 11:15 AM Medical Record Number: [id number] Patient Account Number: [id number] Date of Birth/Sex: 09-Aug-1946 (76 y.o. F) Treating RN: Levora Dredge Primary Care Provider: [name] Other Clinician: Referring Provider: [name] Treating Provider/Extender: [name] in Treatment: 1 Subjective Chief Complaint Information obtained from Patient Right LE Ulcer History of Present Illness (HPI) Pleasant 76 year old with history of chronic venous insufficiency. No diabetes or peripheral vascular disease. Left ABI 1.29. Questionable history of left lower extremity DVT. She developed a recurrent ulceration on her left lateral calf in December 2015, which she attributes to poor diet and subsequent lower extremity edema. She underwent endovenous laser ablation of her left greater saphenous vein in 2010. She underwent laser ablation of accessory branch of left  GSV in April 2016 by Dr. Kellie Simmering at [facility]. She was previously wearing Unna boots, which she tolerated well. Tolerating 2 layer compression and cadexomer iodine. She returns to clinic for follow-up and is without new complaints. She denies any significant pain at this time. She reports persistent pain with pressure. No claudication or ischemic rest pain. No fever or chills. No drainage. READMISSION 11/13/16;  this is a 76 year old woman who is not a diabetic. She is here for a review of a painful area on her left medial lower extremity. I note that she was seen here previously last year for wound I believe to be in the same area. At that time she had undergone previously a left greater saphenous vein ablation by Dr. Kellie Simmering and she had a ablation of the anterior accessory branch of the left greater saphenous vein in March 2016. Seeing that the wound actually closed over. In reviewing the history with her today the ulcer in this area has been recurrent. She describes a biopsy of this area in 2009 that only showed stasis physiology. She also has a history of today malignant melanoma in the right shoulder for which she follows with Dr. 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 Do not see that she has ever had arterial studies in the left leg. She tells me she has a follow-up with Dr. Kellie Simmering in roughly 10 days In any case she developed the reopening of this area roughly a month ago. On the background of this she describes rapidly increasing edema which has responded to Lasix 40 mg and metolazone 2.5 mg as well as the patient's lymph massage. She has been told she has both venous insufficiency and lymphedema but she cannot tolerate compression stockings 11/28/16; the patient saw Dr. Kellie Simmering recently. Per the patient he did arterial Dopplers in the office that did not show evidence of  arterial insufficiency, per the patient he stated "treat this like an ordinary venous ulcer". She also saw her dermatologist Dr. Ronnald Ramp who felt that this was more of a vascular ulcer. In general things are improving although she arrives today with increasing bilateral lower extremity edema with weeping a deeper fluid through the wound on the left medial leg compatible with some degree of lymphedema 12/04/16; the patient's wound is fully epithelialized but I don't think fully healed. We will do another week of depression with Promogran and TCA however I suspect we'll be able to discharge her next week. This is a very unusual-looking wound which was initially a figure-of-eight type wound lying on its side surrounded by petechial like hemorrhage. She has had venous ablation on this side. She apparently does not have an arterial issue per Dr. Kellie Simmering. She saw her dermatologist thought it was "vascular". Patient is definitely going to need ongoing compression and I talked about this with her today she will go to elastic therapy after she leaves here next week 12/11/16; the patient's wound is not completely closed today. She has surrounding scar tissue and in further discussion with the patient it would appear that she had ulcers in this area in 2009 for a prolonged period of time ultimately requiring a punch biopsy of this area that only showed venous insufficiency. I did not previously pickup on this part of the history from the patient. 12/18/16; the patient's wound is completely epithelialized. There is no open area here. She has significant bilateral venous insufficiency with secondary lymphedema to a mild-to-moderate degree she does not have compression stockings.. She did not say anything to me when I was in the room, she told our intake nurse that she was still having pain in this area. This isn't unusual recurrent small open area. She is going to go to elastic therapy to obtain compression  stockings. 12/25/16; the patient's  wound is fully epithelialized. There is no open area here. The patient describes some continued episodic discomfort in this area medial left calf. However everything looks fine and healed here. She is been to elastic therapy and caught herself 15-20 mmHg stockings, they apparently were having trouble getting 20-30 mm stockings in her size 01/22/17; this is a patient we discharged from the clinic a month ago. She has a recurrent open wound on her medial left calf. She had 15 mm support stockings. I told her I thought she needed 20-30 mm compression stockings. She tells me that she has been ill with hospitalization secondary to asthma and is been found to have severe hypokalemia likely secondary to a combination of Lasix and metolazone. This morning she noted blistering and leaking fluid on the posterior part of her left leg. She called our intake nurse urgently and we was saw her this afternoon. She has not had any real discomfort here. I don't know that she's been wearing any stockings on this leg for at least 2-3 days. ABIs in this clinic were 1.21 on the right and 1.3 on the left. She is previously seen vascular surgery who does not think that there is a peripheral arterial issue. 01/30/17; Patient arrives with no open wound on the left leg. She has been to elastic therapy and obtained 20-72mmhg below knee stockings and she has one on the right leg today. READMISSION 02/19/18; this Jollie is a now 76 year old patient we've had in this clinic perhaps 3 times before. I had last looked at her from January 07 December 2016 with an area on the medial left leg. We discharged her on 12/25/16 however she had to be readmitted on 01/22/17 with a recurrence. I have in my notes that we discharged her on 20-30 mm stockings although she tells me she was only wearing support hose because she cannot get stockings on predominantly related to her cervical spine surgery/issues. She has had  previous ablations done by vein and vascular in [place] including a great saphenous vein ablation on the left with an anterior accessory branch ablation I think both of these were in 2016. On one of the previous visit she had a biopsy noted 2009 that was negative. She is not felt to have an arterial issue. She is not a diabetic. She does have a history of obstructive sleep apnea hypertension asthma as well as chronic venous insufficiency and lymphedema. [name], [name] ([id number]) On this occasion she noted 2 dry scaly patch on her left leg. She tried to put lotion on this it didn't really help. There were 2 open areas.the patient has been seeing her primary physician from 02/05/18 through 02/14/18. She had Unna boots applied. The superior wound now on the lateral left leg has closed but she's had one wound that remains open on the lateral left leg. This is not the same spot as we dealt with in 2018. ABIs in this clinic were 1.3 bilaterally 02/26/18; patient has a small wound on the left lateral calf. Dimensions are down. She has chronic venous insufficiency and lymphedema. 03/05/18; small open area on the left lateral calf. Dimensions are down. Tightly adherent necrotic debris over the surface of the wound which was difficult to remove. Also the dressing [over collagen] stuck to the wound surface. This was removed with some difficulty as well. Change the primary dressing to Hydrofera Blue ready 03/12/18; small open area on the left lateral calf. Comes in with tightly adherent surface eschar as well as  some adherent Hydrofera Blue. 03/19/18; open area on the left lateral calf. Again adherent surface eschar as well as some adherent Hydrofera Blue nonviable subcutaneous tissue. She complained of pain all week even with the reduction from 4-3 layer compression I put on last week. Also she had an increase in her ankle and calf measurements probably related to the same thing. 03/26/18; open area on the left  lateral calf. A very small open area remains here. We used silver alginate starting last week as the Hydrofera Blue seem to stick to the wound bed. In using 4-layer compression 04/02/18; the open area in the left lateral calf at some adherent slough which I removed there is no open area here. We are able to transition her into her own compression stocking. Truthfully I think this is probably his support hose. However this does not maintain skin integrity will be limited. She cannot put over the toe compression stockings on because of neck problems hand problems etc. She is allergic to the lining layer of juxta lites. We might be forced to use extremitease stocking should this fail READMIT 11/24/2018 Patient is now a 76 year old woman who is not a diabetic. She has been in this clinic on at least 3 previous occasions largely with recurrent wounds on her left leg secondary to chronic venous insufficiency with secondary lymphedema. Her situation is complicated by inability to get stockings on and an allergy to neoprene which is apparently a component and at least juxta lites and other stockings. As a result she really has not been wearing any stockings on her legs. She tells [place] that roughly 2 or 3 weeks ago she started noticing a stinging sensation just above her ankle on the left medial aspect. She has been diagnosed with pseudogout and she wondered whether this was what she was experiencing. She tried to dress this with something she bought at the store however subsequently it pulled skin off and now she has an open wound that is not improving. She has been using Vaseline gauze with a cover bandage. She saw her primary doctor last week who put an Haematologist on her. ABIs in this clinic was 1.03 on the left 2/12; the area is on the left medial ankle. Odd-looking wound with what looks to be surface epithelialization but a multitude of small petechial openings. This clearly not closed yet. We have been  using silver alginate under 3 layer compression with TCA 2/19; the wound area did not look quite as good this week. Necrotic debris over the majority of the wound surface which required debridement. She continues to have a multitude of what looked to be small petechial openings. She reminds [place] that she had a biopsy on this initially during her first outbreak in 2015 in [facility]. She expresses concern about this being a possible melanoma. 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 I assured her that this does not look anything like melanoma. Except for the petechial reaction it does look like a venous insufficiency area and she certainly has evidence of this on both sides 2/26; a difficult area on the left medial ankle. The patient clearly has chronic venous hypertension with some degree of lymphedema. The odd thing about the area is the small petechial hemorrhages. I am not really sure how to explain this. This was present last time and this is not a compression injury. We have been using Hydrofera Blue which  I changed to last week 3/4; still using Hydrofera Blue. Aggressive debridement today. She does not have known arterial issues. She has seen Dr. Kellie Simmering at [facility] and and has an ablation on the left. [Anterior accessory branch of the greater saphenous]. From what I remember they did not feel she had an arterial issue. The patient has had this area biopsied in 2009 at [facility] and by her recollection they said this was "stasis". She is also follow-up with dermatology locally who thought that this was more of a vascular issue 3/11; using Hydrofera Blue. Aggressive debridement today. She does not have an arterial issue. We are using 3 layer compression although we may need to go to 4. The patient has been in for multiple changes to her wrap since I last saw her a  week ago. She says that the area was leaking. I do not have too much more information on what was found 01/19/19 on evaluation today patient was actually being seen for a nurse visit when unfortunately she had the area on her left lateral lower extremity as well as weeping from the right lower extremity that became apparent. Therefore we did end up actually seeing her for a full visit with myself. She is having some pain at this site as well but fortunately nothing too significant at this point. No fevers, chills, nausea, or vomiting noted at this time. 3/18-Patient is back to the clinic with the left leg venous leg ulcer, the ulcer is larger in size, has a surface that is densely adherent with fibrinous tissue, the Hydrofera Blue was used but is densely adherent and there was difficulty in removing it. The right lower extremity was also wrapped for weeping edema. Patient has a new area over the left lateral foot above the malleolus that is small and appears to have no debris with intact surrounding skin. Patient is on increased dose of Lasix also as a means to edema management 3/25; the patient has a nonhealing venous ulcer on the medial left leg and last week developed a smaller area on the lateral left calf. We have been using Hydrofera Blue with a contact layer. 4/1; no major change in these wounds areas. Left medial and more recently left lateral calf. I tried Iodoflex last week to aid in debridement she did not tolerate this. She stated her pain was terrible all week. She took the top layer of the 4 layer compression off. 4/8; the patient actually looks somewhat better in terms of her more prominent left lateral calf wound. There is some healthy looking tissue here. She is still complaining of a lot of discomfort. 4/15; patient in a lot of pain secondary to sciatica. She is on a prednisone taper prescribed by her primary physician. She has the 2 areas one on the left medial and more recently a  smaller area on the left lateral calf. Both of these just above the malleoli 4/22; her back pain is better but she still states she is very uncomfortable and now feels she is intolerant to the [employer]. No real change in the wounds we have been using Sorbact. She has been previously intolerant to Iodoflex. There is not a lot of option about what we can use to debride this wound under compression that she no doubt needs. sHe states Ultram no longer works for her pain 4/29; no major change in the wounds slightly increased depth. Surface on the original medial wound perhaps somewhat improved however the more  recent area on the lateral left ankle is 100% covered in very adherent debris we have been using Sorbact. She tolerates 4 layer compression well and her edema control is a lot better. She has not had to come in for a nurse check 5/6; no major change in the condition of the wounds. She did consent to debridement today which was done with some difficulty. Continuing Sorbact. She did not tolerate Iodoflex. She was in for a check of her compression the day after we wrapped her last week this was adjusted but nothing much was found 5/13; no major change in the condition or area of the wounds. I was able to get a fairly aggressive debridement done on the lateral left leg [name], [name] ([id number]) wound. Even using Sorbact under compression. She came back in on Friday to have the wrap changed. She says she felt uncomfortable on the lateral aspect of her ankle. She has a long history of chronic venous insufficiency including previous ablation surgery on this side. 5/20-Patient returns for wounds on left leg with both wounds covered in slough, with the lateral leg wound larger in size, she has been in 3 layer compression and felt more comfortable, she describes pain in ankle, in leg and pins and needles in foot, and is about to try Pamelor for this 6/3; wounds on the left lateral and left medial leg. The  area medially which is the most recent of the 2 seems to have had the largest increase in dimensions. We have been using Sorbac to try and debride the surface. She has been to see orthopedics they apparently did a plain x-ray that was indeterminant. Diagnosed her with neuropathy and they have ordered an MRI to determine if there is underlying osteomyelitis. This was not high on my thought list but I suppose it is prudent. We have advised her to make an appointment with vein and vascular in [place]. She has a history of a left greater saphenous and accessory vein ablations I wonder if there is anything else that can be done from a surgical point of view to help in these difficult refractory wounds. We have previously healed this wound on one occasion but it keeps on reopening [medial side] 6/10; deep tissue culture I did last week I think on the left medial wound showed both moderate E. coli and moderate staph aureus [MSSA]. She is going to require antibiotics and I have chosen Augmentin. We have been using Sorbact and we have made better looking wound surface on both sides but certainly no improvement in wound area. She was back in last Friday apparently for a dressing changes the wrap was hurting her outer left ankle. She has not managed to get a hold of vein and vascular in [place]. We are going to have to make her that appointment 6/17; patient is tolerating the Augmentin. She had an MRI that I think was ordered by orthopedic surgeon this did not show osteomyelitis or an abscess did suggest cellulitis. We have been using Sorbact to the lateral and medial ankles. We have been trying to arrange a follow-up appointment with vein and vascular in [place] or did her original ablations. We apparently an area sent the request to vein and vascular in [facility] 6/24; patient has completed the Augmentin. We do not yet have a vein and vascular appointment in [place]. I am not sure what the issue  is here we have asked her to call tomorrow. We are using Sorbact. Making some improvements and  especially the medial wound. Both surfaces however look better medial and lateral. 7/1; the patient has been in contact with vein and vascular in [place] but has not yet received an appointment. Using Sorbact we have gradually improve the wound surface with no improvement in surface area. She is approved for Apligraf but the wound surface still is not completely viable. She has not had to come in for a dressing change 7/8; the patient has an appointment with vein and vascular on 7/31 which is a Friday afternoon. She is concerned about getting back here for [place] to dress her wounds. I think it is important to have them goal for her venous reflux/history of ablations etc. to see if anything else can be done. She apparently tested positive for 1 of the blood tests with regards to lupus and saw a rheumatologist. He has raised the issue of vasculitis again. I have had this thought in the past however the evidence seems overwhelming that this is a venous reflux etiology. If the rheumatologist tells me there is clinical and laboratory investigation is positive for lupus I will rethink this. 7/15; the patient's wound surfaces are quite a bit better. The medial area which was her original wound now has no depth although the lateral wound which was the more recent area actually appears larger. Both with viable surfaces which is indeed better. Using Sorbact. I wanted to use Apligraf on her however there is the issue of the vein and vascular appointment on 7/31 at 2:00 in the afternoon which would not allow her to get back to be rewrapped and they would no doubt remove the graft 7/22; the patient's wound surfaces have moderate amount of debris although generally look better. The lateral one is larger with 2 small satellite areas superiorly. We are waiting for her vein and vascular appointment on 7/31. She has been  approved for Apligraf which I would like to use after th 7/29; wound surfaces have improved no debridement is required we have been using Sorbact. She sees vein and vascular on Friday with this so question of whether anything can be done to lessen the likelihood of recurrence and/or speed the healing of these areas. She is already had previous ablations. She no doubt has severe venous hypertension 8/5-Patient returns at 1 week, she was in [place] for 3 days by her podiatrist, we have been using so backed to the wound, she has increased pain in both the wounds on the left lower leg especially the more distal one on the lateral aspect 8/12-Patient returns at 1 week and she is agreeable to having debridement in both wounds on her left leg today. We have been using Sorbact, and vascular studies were reviewed at last visit 8/19; the patient arrives with her wounds fairly clean and no debridement is required. We have used Sorbact which is really done a nice job in cleaning up these very difficult wound surfaces. The patient saw Dr. Donzetta Matters of vascular surgery on 7/31. He did not feel that there was an arterial component. He felt that her treated greater saphenous vein is adequately addressed and that the small saphenous vein did not appear to be involved significantly. She was also noted to have deep venous reflux which is not treatable. Dr. Donzetta Matters mentioned the possibility of a central obstructive component leading to reflux and he offered her central venography. She wanted to discuss this or think about it. I have urged her to go ahead with this. She has had recurrent difficult  wounds in these areas which do heal but after months in the clinic. If there is anything that can be done to reduce the likelihood of this I think it is worth it. 9/2 she is still working towards getting follow-up with Dr. Donzetta Matters to schedule her CT. Things are quite a bit worse venography. I put Apligraf on 2 weeks ago on both wounds  on the medial and lateral part of her left lower leg. She arrives in clinic today with 3 superficial additional wounds above the area laterally and one below the wound medially. She describes a lot of discomfort. I think these are probably wrapped injuries. Does not look like she has cellulitis. 07/20/2019 on evaluation today patient appears to be doing somewhat poorly in regard to her lower extremity ulcers. She in fact showed signs of erythema in fact we may even be dealing with an infection at this time. Unfortunately I am unsure if this is just infection or if indeed there may be some allergic reaction that occurred as a result of the Apligraf application. With that being said that would be unusual but nonetheless not impossible in this patient is one who is unfortunately allergic to quite a bit. Currently we have been using the Sorbact which seems to do as well as anything for her. I do think we may want to obtain a culture today to see if there is anything showing up there that may need to be addressed. 9/16; noted that last week the wounds look worse in 1 week follow-up of the Apligraf. Using Sorbact as of 2 days ago. She arrives with copious amounts of drainage and new skin breakdown on the back of the left calf. The wounds arm more substantial bilaterally. There is a fair amount of swelling in the left calf no overt DVT there is edema present I think in the left greater than right thigh. She is supposed to go on 9/28 for CT venography. The wounds on the medial and lateral calf are worse and she has new skin breakdown posteriorly at least new for me. This is almost developing into a circumferential wound area The Apligraf was taken off last week which I agree with things are not going in the right direction a culture was done we do not have that back yet. She is on Augmentin that she started 2 days ago 9/23; dressing was changed by her nurses on Monday. In general there is no improvement in the  wound areas although the area looks less angry than last week. She did get Augmentin for MSSA cultured on the 14th. She still appears to have too much swelling in the left leg even with 3 layer compression 9/30; the patient underwent her procedure on 9/28 by Dr. Donzetta Matters at vascular and vein specialist. She was discovered to have the common iliac vein measuring 12.2 mm but at the level of L4-L5 measured 3 mm. After stenting it measured 10 mm. 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 Rouleaux flow in the common femoral and femoral vein was observed much improved after stenting. We are using silver alginate to the wounds on the medial and lateral ankle on the left. 4 layer compression 10/7; the patient had fluid swelling around her knee and 4 layer compression. At the advice of vein and vascular this was reduced to 3 layer which she is tolerating better. We have been using silver alginate under 3 layer compression since last Friday CARLEN, REBUCK ([id number]) 10/14; arrives with  the areas on the left ankle looking a lot better. Inflammation in the area also a lot better. She came in for a nurse check on 10/9 10/21; continued nice improvement. Slight improvements in surface area of both the medial and lateral wounds on the left. A lot of the satellite lesions in the weeping erythema around these from stasis dermatitis is resolved. We have been using silver alginate 10/28; general improvement in the entire wound areas although not a lot of change in dimensions the wound certainly looks better. There is a lot less in terms of venous inflammation. Continue silver alginate this week however look towards Hydrofera Blue next week 11/4; very adherent debris on the medial wound left wound is not as bad. We have been using silver alginate. Change to [name] today 11/11; very adherent debris on both wound areas. She went to vein and vascular last week and follow-up they put in [place] boot on  this today. He says the [facility] was adherent. Wound is definitely not as good as last week. Especially on the left there the satellite lesions look more prominent 11/18; absolutely no better. erythema on lateral aspect with tenderness. 09/30/2019 on evaluation today patient appears to actually be doing better. Dr. Dellia Nims did put her on doxycycline last week which I do believe has helped her at this point. Fortunately there is no signs of active infection at this time. No fevers, chills, nausea, vomiting, or diarrhea. I do believe he may want extend the doxycycline for 7 additional days just to ensure everything does completely cleared up the patient is in agreement with that plan. Otherwise she is going require some sharp debridement today 12/2; patient is completing a 2-week course of doxycycline. I gave her this empirically for inflammation as well as infection when I last saw her 2 weeks ago. All of this seems to be better. She is using silver alginate she has the area on the medial aspect of the larger area laterally and the 2 small satellite regions laterally above the major wound. 12/9; the patient's wound on the left medial and left lateral calf look really quite good. We have been using silver alginate. She saw vein and vascular in follow-up on 10/09/2019. She has had a previous left greater saphenous vein ablation by Dr. Oscar La in 2016. More recently she underwent a left common iliac vein stent by Dr. 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 The swelling is improved and certainly the wounds have improved. The patient shows [place] today area on the right medial calf there is almost no wound but leaking lymphedema. She says she start this started 3 or 4 days ago. She did not traumatize it. It is not painful. She does not wear compression on that side 12/16; the patient continues to do well laterally. Medially still requiring debridement. The area on the right calf did not  materialize to anything and is not currently open. We wrapped this last time. She has support stockings for that leg although I am not sure they are going to provide adequate compression 12/23; the lateral wound looks stable. Medially still requiring debridement for tightly adherent fibrinous debris. We've been using silver alginate. Surface area not any different 12/30; neither wound is any better with regards to surface and the area on the left lateral is larger. I been using silver alginate to the left lateral which look quite good last week and Sorbact to the left medial 11/11/2019. Lateral wound area actually looks better  and somewhat smaller. Medial still requires a very aggressive debridement today. We have been using Sorbact on both wound areas 1/13; not much better still adherent debris bilaterally. I been using Sorbact. She has severe venous hypertension. Probably some degree of dermal fibrosis distally. I wonder whether tighter compression might help and I am going to try that today. We also need to work on the bioburden 1/20; using Sorbact. She has severe venous hypertension status post stent placement for pelvic vein compression. We applied gentamicin last time to see if we could reduce bioburden I had some discussion with her today about the use of pentoxifylline. This is occasionally used in this setting for wounds with refractory venous insufficiency. However this interacts with Plavix. She tells me that she was put on this after stent placement for 3 months. She will call Dr. Claretha Cooper office to discuss 1/27; we are using gentamicin under Sorbact. 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 She has a stent. Wound medially is measuring smaller this week. Laterally measuring slightly larger although she has some satellite lesions superiorly 2/3; gentamicin under Sorbact under 4-layer compression. 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 She has a stent on Plavix. Her wounds are measuring smaller this week. More substantially laterally where there is a satellite lesion superiorly. 2/10; gentamicin under Sorbac. 4-layer compression. Patient communicated with Dr. Donzetta Matters at vein and vascular in [place]. He is okay with the patient coming off Plavix I will therefore start her on pentoxifylline for a 1 month trial. In general her wounds look better today. I had some concerns about swelling in the left thigh however she measures 61.5 on the right and 63 on the mid thigh which does not suggest there is any difficulty. The patient is not describing any pain. 2/17; gentamicin under Sorbac 4-layer compression. She has been on pentoxifylline for 1 week and complains of loose stool. No nausea she is eating and drinking well 2/24; the patient apparently came in 2 days ago for a nurse visit when her wrap fell down. Both areas look a little worse this week macerated medially and satellite lesions laterally. Change to silver alginate today 3/3; wounds are larger today especially medially. She also has more swelling in her foot lower leg and I even noted some swelling in her posterior thigh which is tender. I wonder about the patency of her stent. Fortuitously she sees Dr. Claretha Cooper group on Friday 3/10; Mrs. Hajduk was seen by vein and vascular on 3/5. The patient underwent ultrasound. There was no evidence of thrombosis involving the IVC no evidence of thrombosis involving the right common iliac vein there is no evidence of thrombosis involving the right external iliac vein the left external vein is also patent. The right common iliac vein stent appears patent bilateral common femoral veins are compressible and appear patent. I was concerned about the left common iliac stent however it looks like this is functional. She has some edema in the posterior thigh that was tender she still has that this week. I also note they had trouble  finding the pulses in her left foot and booked her for an ABI baseline in 4 weeks. She will follow up in 6 months for repeat IVC duplex. The patient stopped the pentoxifylline because of diarrhea. It does not look like that was being effective in any case. I have advised her to go back on her aspirin 81 mg tablet, vascular it also suggested this 3/17; comes in today with  her wound surfaces a lot better. The excoriations from last week considerably better probably secondary to the TCA. We have been using silver alginate 3/24; comes in today with smaller wounds both medially and laterally. Both required debridement. There are 2 small satellite areas superiorly laterally. She also has a very odd bandlike area in the mid calf almost looking like there was a weakness in the wrap in a localized area. I would write this off as being this however anteriorly she has a small raised ballotable area that is very tender almost reminiscent of an abscess but there was no obvious purulent surface to it. [name], [name] ([id number]) 02/04/20 upon evaluation today patient appears to be doing fairly well in regard to her wounds today. Fortunately there is no signs of active infection at this time. No fevers, chills, nausea, vomiting, or diarrhea. She has been tolerating the dressing changes without complication. Fortunately I feel like she is showing signs of improvement although has been sometime since have seen her. Nonetheless the area of concern that Dr. Dellia Nims had last week where she had possibly an area of the wrap that was we can allow the leg to bulge appears to be doing significantly better today there is no signs of anything worsening. 4/7; the patient's wounds on her medial and lateral left leg continue to contract. We have been using a regular alginate. Last week she developed an area on the right medial lower leg which is probably a venous ulcer as well. 4/14; the wounds on her left medial and lateral  lower leg continue to contract. Surface eschar. We have been using regular alginate. The area on the right medial lower leg is closed. We have been putting both legs under 4-layer contraction. The patient went back to see vein and vascular she had arterial studies done which were apparently "quite good" per the patient although I have not read their notes I have never felt she had an arterial issue. 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 This is been longstanding and refractory to exercise, leg elevation and longstanding use of compression wraps in our clinic as well as compression stockings on the times we have been able to get these to heal 4/21; we thought she actually might be close this week however she arrives in clinic with a lot of edema in her upper left calf and into her posterior thigh. This is been an intermittent problem here. She says the wrap fell down but it was replaced with a nurse visit on Monday. We are using calcium alginate to the wounds and the wound sizes there not terribly larger than last week but there is a lot more edema 4/28; again wound edges are smaller on both sides. Her edema is better controlled than last time. She is obtained her compression pumps from [employer] although they have not been to her home to set these up. 5/5; left medial and left lateral both look stable. I am not sure the medial is any smaller. We have been using calcium alginate under 4-layer compression. She had an area on the right medial. This was eschared today. We have been wrapping this as well. She does not tolerate external compression stockings due to a history of various contact allergies. She has her compression pumps however the representative from the company is coming on her to show her how to use these tomorrow 5/19; patient with severe chronic venous insufficiency secondary to central venous disease. She had a  stent placed in  her left common iliac vein. She has done better since but still difficult to control wounds. She comes in today with nothing open on the right leg. Her areas on the left medial and left lateral are just about closed. We are using calcium alginate under 4-layer compression. She is using her external compression pumps at home She only has 15-20 support stockings. States she cannot get anything tighter than that on. 03/30/20-Patient returns at 1 week, the wounds on the left leg are both slightly bigger, the last week she was on 3 layer compression which started to slide down. She is starting to use her lymphedema pumps although she stated on 1 day her right ankle started to swell up and she have to stop that day. Unfortunately the open area seem to oscillate between improving to the point of healing and then flaring up all to do with effectiveness of compression or lack of due to the left leg topography not keeping the compression wraps from rolling down 6/2 patient comes in with a 15/20 mmHg stocking on the right leg. She tells me that she developed a lot of swelling in her ankles she saw orthopedics she was felt to possibly be having a flare of pseudogout versus some other type of arthritis. She was put on steroids for a respiratory issue so that helps with the inflammation. She has not been using the pumps all week. She thinks the left thigh is more swollen than usual and I would agree with that. She has an appointment with Dr. Donzetta Matters 9 days or so from now 6/9; both wounds on the left medial and left lateral are smaller. We have been using calcium alginate under compression. She does not have an open wound on the right leg she is using a stocking and her compression pumps things are going well. She has an appointment with Dr. Donzetta Matters with regards to her stent in the left common iliac vein 6/16; the wounds on the left medial and left lateral ankle continues to contract. The patient saw Dr. Donzetta Matters and I think  he seems satisfied. Ordered follow-up venous reflux studies on both sides in September. Cautioned that she may need thigh-high stockings. She has been using calcium alginate under compression on the left and her own stocking on the right leg. She tells [place] there are no open wounds on the right 6/23; left lateral is just about closed. Medial required debridement today. We have been using calcium alginate. Extensive discussion about the compression pumps she is only using these on 25 mmHg states she could not take 40 or 30 when the wrap came out to her home to demonstrate these. He said they should not feel tight 6/30; the left lateral wound has a slight amount of eschar. . The area medially is about the same using Hydrofera Blue. 7/7; left lateral wound still has some eschar. I will remove this next week may be closed. The area medially is very small using Hydrofera Blue with improvement. Unfortunately the stockings fell down. Unfortunately the blisters have developed at the edge of where the wrap fell. When this happened she says her legs hurt she did not use her pumps. We are not open Monday for her to come in and change the wraps and she had an appointment yesterday. She also tells me that she is going to have an MRI of her back. She is having pain radiating into her left anterior leg she thinks her from an L5 disc.  She saw Dr. Ellene Route of neurosurgery 7/14; the area on the left lateral ankle area is closed. Still a small area medially however it looks better as well. We have been using Hydrofera Blue under 4-layer compression 7/21; left lateral ankle is still closed however her wound on the medial left calf is actually larger. This is probably because Hydrofera Blue got stuck to the wound. She came in for a nurse change on Friday and will do that again this week I was concerned about the amount of swelling that she had last week however she is using her compression pumps twice a day and the swelling  seems well controlled 7/28; remaining wound on the left medial lower leg is smaller. We have been using moistened silver collagen under compression she is coming back for a nurse visit. For reasons that were not really clear she was just keeping her legs elevated and not using her compression pumps. I have asked her to use the compression pumps. She does not have any wounds on the right leg 06/15/20-Patient returns at 2 weeks, her LLE edema is worse and she developed a blister wound that is new and has bigger posterior calf wound on right, we are using Prisma with pad, 4 layer compression. she has been on lasix 40 mg daily 8/18; patient arrives today with things a lot worse than I remember from a few weeks ago. She was seen last week. Noted that her edema was worse and that she now had a left lateral wound as well as deteriorating edema in the medial and posterior part of the lower leg. She says she is using his or her external compression pumps once a day although I wonder about the compliance. 8/25; weeping area on the right medial lower leg. This had actually gotten a small localized area of her compression stocking wet. On the left side there is a large denuded area on the posterior medial lower leg and smaller area on the lateral. This was not the original areas that we dealt with. 9/1 the patient's wound on the left leg include the left lateral and left posterior. Larger superficial wounds weeping. She has very poor edema control. Tender localized edema in the left lower medial ankle/heel probably because of localized wrap issues. She freely admits she is not using the compression pumps. She has been up on her feet a lot. She thinks the hydrofera blue is contributing to the pain she is experiencing.. This is a complaint that I have occasionally heard MATISSE, ROSKELLEY ([id number]) 9/8; really not much improvement. The patient is still complaining of a lot of pain particularly when she uses  compression pumps. I switched her to silver alginate last time because she found the Hydrofera Blue to be irritating. I don't hear much difference in her description with the silver alginate. 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 She has follow-up with Dr. Donzetta Matters I think for ultrasound next month 9/15; quite a bit of improvement today. We have less edema and more epithelialization in both of her wound areas on the left medial and left lateral calf. These are not the site of her original wounds in this area. She says she has been using her compression pumps for 30 minutes twice a day, there pain issues that never quite understood. Silver alginate as the primary dressing 9/22; continued improvement. Both areas medially and laterally still have a small  open area there is some eschar. She continues to complain of left medial ankle pain. Swelling in the leg is in much better condition. We have been using silver alginate 9/29; continued improvement. Both areas medially and laterally in the left calf look as though they are close some minor surface eschar but I think this is epithelialized. She comes in today saying she has a ruptured disc at L4-L5 cannot bend over to put on her stockings. 10/6; patient comes in today with no open wounds on either leg. However her edema on the left leg in the upper one third of the lower leg is poorly controlled nonpitting. She says that she could not use the pumps for 2 days and then she has been using the last couple of days. It is not clear to me she has been able to get her stocking on. She has back problems. Mrs. Clendenning has severe chronic venous insufficiency with secondary lymphedema. Her venous insufficiency is partially centrally mediated and that she is now post stent in the left common iliac vein. [facility] Thurner's syndrome/physiology]. She follows up with them on 10/15. She wears  20/30 below-knee stockings. She is supposed to use compression pumps at home although I think her compliance about with this is been less than 100%. I have asked her to use these 3 times a day. Finally I think she has lipodermatosclerosis in the left lower leg with an inverted bottle sign. It is been a major problem controlling the edema in the left leg. The right leg we have had wounds on but not as significant a problem is on the left READMISSION 04/12/2021 Mrs. Delisi is a 76 year old woman we know well in this clinic. She has severe chronic venous insufficiency. 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 I believe she has had bilateral greater saphenous vein ablation in the past as well. She tells me that this wound opened sometime in March. She had a fall and thinks it was initially abrasion. She developed areas she describes as little blisters on the anterior part of her leg and she saw dermatology and was treated for methicillin staph aureus with several rounds of antibiotics. She has been using support stockings on the left leg and says this is the only thing she can get on. Her compression pump use maybe once a day she says if she did not use one she use the other. She comes in today with incredible swelling in the left leg with a wound on the left posterior calf. She has been using Neosporin to this previously a hydrocolloid. 6/15; patient arrives back for 1 week follow-up.Marland Kitchen Apparently her wrap fell down she did not call us to replace this. He has poor edema control. She only uses her compression pumps once a day 6/29; patient presents for 1 week follow-up. She has tolerated the compression wrap well. We have been using Iodoflex under the wrap. She has no issues or complaints today. 7/6; patient presents for 1 week follow-up. She states that the compression wrap rolled down her leg 4 days ago. She has been trying to keep the area covered but has no  dressings at home to use. She denies signs of infection. 7/13; patient presents for 1 week follow-up. She states that her compression wrap rolled down her right leg and she called our office and had it placed a few days ago. She has been tolerating the current wrap well. She states that the  Iodoflex is causing a burning sensation. She denies signs of infection. 7/27; patient presents for 1 week follow-up She has tolerated the compression wrap well with Hydrofera Blue underneath. She has now developed a wound to her right lower extremity. She reports having a culture done To this area by her dermatologist that she reports is negative. She currently denies signs of infection. 8/30; patient presents for 1 week follow-up. On the left side she has tolerated the compression wrap well with Hydrofera Blue underneath. She reports some discomfort to her right lower extremity with the 3 layer compression. She currently denies signs of infection. 06/14/2021 upon evaluation today patient's wound actually appears to be doing decently well based on what I am seeing currently. She actually has 2 areas 1 on the left distal/posterior lower leg and the other on the right lower leg. Subsequently the measurements are roughly the same may be slightly smaller but in general have not made any trend towards getting worse which is great news. 06/23/2021 upon evaluation today patient appears to be doing pretty well in regard to her wounds. On the right she is having difficulty with sciatic pain and this subsequently has led to her taking the wrap off over the last week her leg is more swollen she is also been on prednisone for 10 days. With that being said I think we may want to just use a compression sock on the left that way she will not have to worry about the compression wrap being in place that she cannot get off. With that being said I do believe as well that the patient is going to need to continue with the wrapping on  the left we will also need to do a little bit of sharp debridement today. 8/24; patient presents for 1 week follow-up. She has no issues or complaints today. She denies signs of infection. 8/31; patient presents for 1 week follow-up. She has no issues or complaints today. She has tolerated the compression wrap well on the left lower extremity. She denies signs of infection. 9/7; patient presents for follow-up. She reports that the compression wrap rolled down slightly on her right lower extremity. She reports tenderness to the wound bed. She denies increased warmth or erythema to the surrounding skin. She overall feels well. 9/14; patient presents for follow-up. She has no issues or complaints today. She denies signs of infection. PuraPly is available and patient would like to start this today. 9/21; patient presents for follow-up. She has no issues or complaints today. She tolerated the first skin substitute placement last week under compression. 9/28; patient presents for 1 week follow-up. She has no issues or complaints today. 10/5; patient presents for 1 week follow-up. She reports taking the wrap off yesterday. She was on her feet more yesterday and developed more swelling to her left lower extremity. She denies pain. Overall she is doing well. KAYDYN, CHISM ([id number]) 10/12; patient presents for 1 week follow-up. She has no issues or complaints today. 10/19; patient presents for follow-up. She has no issues or complaints today. She tolerated the compression wrap well. She states she has compression stockings at home. Readmission: 11/27/2021 upon evaluation today patient appears to be doing poorly in regard to the wounds on her right leg. She also has an area of erythema in the proximal calf area of the right leg which again I do believe is a issue from the standpoint of it being warm and erythematous to touch and also seems to be somewhat  localized I really think this may be more of a  cellulitis issue than anything else. Fortunately I do not see any signs of active infection Systemically at this time. 12/05/2021 upon evaluation patient's wounds actually appear to be showing some signs of improvement which is not too bad and just 1 week's time. Especially since we had to get on the right antibiotic which is only been for the past 4 days. Fortunately I do not see any signs of active infection locally nor systemically at this time that has gotten any worse. Overall I think that again with the antibiotic she is significantly improved and currently she is taking Levaquin. Objective Constitutional [name] and well-hydrated in no acute distress. Vitals Time Taken: 11:54 AM, Height: 62 in, Weight: 199 lbs, BMI: 36.4, Temperature: 98.2 F, Pulse: 94 bpm, Respiratory Rate: 18 breaths/min, Blood Pressure: 168/96 mmHg. General Notes: pt asymptomatic, pt states takes BP in the evening and she states she took her BP med last night. PA Stone made aware Respiratory normal breathing without difficulty. Psychiatric this patient is able to make decisions and demonstrates good insight into disease process. Alert and Oriented x 3. pleasant and cooperative. General Notes: Upon inspection patient's wound bed showed signs of better granulation and epithelization there still was a lot of dry skin but I think that this does appear to be much less inflamed compared to last week I do believe that the Levaquin is doing an awesome job. Integumentary (Hair, Skin) Wound #15 status is Open. Original cause of wound was Gradually Appeared. The date acquired was: 10/27/2021. The wound has been in treatment 1 weeks. The wound is located on the Right,Circumferential Lower Leg. The wound measures 6cm length x 15cm width x 0.1cm depth; 70.686cm^2 area and 7.069cm^3 volume. There is no tunneling or undermining noted. There is a large amount of serosanguineous drainage noted. The wound margin is indistinct  and nonvisible. There is small (1-33%) granulation within the wound bed. There is a large (67-100%) amount of necrotic tissue within the wound bed including Adherent Slough. Assessment Active Problems ICD-10 Lymphedema, not elsewhere classified Chronic venous hypertension (idiopathic) with ulcer and inflammation of right lower extremity Non-pressure chronic ulcer of other part of right lower leg limited to breakdown of skin Procedures Wound #15 Pre-procedure diagnosis of Wound #15 is a Lymphedema located on the Right,Circumferential Lower Leg . There was a Three Layer Compression Therapy Procedure by Levora Dredge, RN. MARJIE, CHEA ([id number]) Post procedure Diagnosis Wound #15: Same as Pre-Procedure Plan Follow-up Appointments: Return Appointment in 1 week. Nurse Visit as needed Bathing/ Shower/ Hygiene: May shower with wound dressing protected with water repellent cover or cast protector. WOUND #15: - Lower Leg Wound Laterality: Right, Circumferential Cleanser: Soap and Water Discharge Instructions: Gently cleanse wound with antibacterial soap, rinse and pat dry prior to dressing wounds Topical: Triamcinolone Acetonide Cream, 0.1%, 15 (g) tube Discharge Instructions: Apply as directed by provider. apply to wound and leg Primary Dressing: Xtrasorb Classic Super Absorbent Dressing, 6x9 (in/in) Discharge Instructions: over open areas of the leg that are weeping Compression Wrap: Unna-Z Zinc and Calamine Boot, 4x10 (in/yd) Discharge Instructions: Use to anchor wrap in place. Apply one strip around leg 3-finger widths below the knee to secure the wrap. Compression Wrap: Profore Lite LF 3 Multilayer Compression Bandaging System Discharge Instructions: Apply 3 multi-layer wrap as prescribed. 1. Would recommend that we going continue with the wound care measures as before and the patient is in agreement with the plan. This includes  the use of the triamcinolone to the leg and wounds  in general that should help with the inflammation aspect of what is going on. 2. I am going to also recommend that we have the patient continue with the XtraSorb to cover which I think is doing a good job as well as for scheduled any excess drainage. 3. I would recommend that we continue with the 3 layer compression wrap. We will see patient back for reevaluation in 1 week here in the clinic. If anything worsens or changes patient will contact our office for additional recommendations. Electronic Signature(s) Signed: 12/05/2021 1:19:48 PM By: Worthy Keeler PA-C Entered By: Worthy Keeler on 12/05/2021 13:19:48 Mathey, [name] ([id number]) -------------------------------------------------------------------------------- SuperBill Details Patient Name: KATHYANN, SPAUGH. Date of Service: 12/05/2021 Medical Record Number: [id number] Patient Account Number: [id number] Date of Birth/Sex: 1946-06-09 (76 y.o. F) Treating RN: Levora Dredge Primary Care Provider: [name] Other Clinician: Referring Provider: [name] Treating Provider/Extender: [name] in Treatment: 1 Diagnosis Coding ICD-10 Codes Code Description I89.0 Lymphedema, not elsewhere classified I87.331 Chronic venous hypertension (idiopathic) with ulcer and inflammation of right lower extremity L97.811 Non-pressure chronic ulcer of other part of right lower leg limited to breakdown of skin Facility Procedures CPT4 Code: [id number] Description: (Facility Use Only) [phone] - [place] L[facility] RT LEG Modifier: Quantity: 1 Physician Procedures CPT4 Code Description: [id number] - WC PHYS LEVEL 3 - EST PT Modifier: Quantity: 1 CPT4 Code Description: ICD-10 Diagnosis Description I89.0 Lymphedema, not elsewhere classified I87.331 Chronic venous hypertension (idiopathic) with ulcer and inflammation of rig L97.811 Non-pressure chronic ulcer of other part of right lower leg limited  to brea Modifier: ht  lower extremit kdown of skin Quantity: y Engineer, maintenance) Signed: 12/05/2021 1:20:05 PM By: Worthy Keeler PA-C Entered By: Worthy Keeler on 12/05/2021 13:20:05

## 2021-12-05 NOTE — Progress Notes (Signed)
° °  [name] NO-SHOWED her new-patient neurology appointment today. Pre-chart review below.     Provider:  Dr Jaynee Eagles Requesting Provider: Kristeen Miss, MD Primary Care Provider:  [name], MD  CC:  NO SHOWED  HPI:  [name] is a 76 y.o. female here as requested by Kristeen Miss, MD for cognitive dysfunction. 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 Per Dr. Danise Mina, appears she has memory loss on opioids and steroids, she had a reassuring memory evaluation November 2022, B12 TSH and RPR were negative/normal, she reported recent cognitive issues most consistent with high-sensitivity to sedating or anticholinergic medication which they will avoid by now.  I also reviewed Dr. Clarice Pole notes, she has a significant lumbar radiculopathy currently, she has a previous laminectomy at L4-L5 years ago [place] prednisone helped, but recently she has had some episodes of seeming forgetful, some word finding difficulty, and trailing off and conversations onto a different subject when talking with other people, she had an episode where essentially she was lost for a period of time and drove around for nearly 2 hours, she did have the help of some kind individuals her noted she was somewhat confused and disoriented but she was able to get home, due to her history of melanoma an MRI of the brain was performed, unremarkable, on exam her strength was good, trace patellar reflex on the right side, chronic kyphosis in the cervical spine, she is planning on getting translaminar injections   Reviewed notes, labs and imaging from outside physicians, which showed:  MRI brain 08/2021: IMPRESSION: Personally reviewed  images and agree with the following 1. No acute intracranial pathology. 2. No evidence of intracranial metastatic disease. 3. Mild chronic white matter microangiopathy and parenchymal volume loss.  B12 638, TSH 2.05, RPR NR NO SHOWED

## 2021-12-06 ENCOUNTER — Telehealth: Payer: Self-pay | Admitting: Neurology

## 2021-12-06 ENCOUNTER — Ambulatory Visit (INDEPENDENT_AMBULATORY_CARE_PROVIDER_SITE_OTHER): Payer: Self-pay | Admitting: Neurology

## 2021-12-06 ENCOUNTER — Telehealth: Payer: Self-pay | Admitting: Family Medicine

## 2021-12-06 DIAGNOSIS — Z91199 Patient's noncompliance with other medical treatment and regimen due to unspecified reason: Secondary | ICD-10-CM

## 2021-12-06 DIAGNOSIS — R4189 Other symptoms and signs involving cognitive functions and awareness: Secondary | ICD-10-CM

## 2021-12-06 NOTE — Telephone Encounter (Signed)
Patient no showed new-patient appt in neurology [facility] if she calls back to reschedule please send her back to new referrals.

## 2021-12-06 NOTE — Telephone Encounter (Signed)
Pt called stating that she was 15 mins late for her appt to the Neurology at [facility]. Pt states now it will be 6 more weeks until she can be rescheduled. Pt is asking for a referral to another Neurology that could get her in sooner. Please advise.

## 2021-12-07 ENCOUNTER — Encounter: Payer: Self-pay | Admitting: Neurology

## 2021-12-07 NOTE — Telephone Encounter (Signed)
Spoke with pt relaying Dr. Synthia Innocent message.  Pt verbalizes understanding.  However, she does not want to be seen at that office and wants to be referred to another neuro office, [facility], if possible.

## 2021-12-07 NOTE — Telephone Encounter (Addendum)
Unfortunately I don't think any other neurology office will be able to see her sooner than 6 wks.  Rec keep scheduled appt

## 2021-12-11 ENCOUNTER — Other Ambulatory Visit: Payer: Self-pay

## 2021-12-11 ENCOUNTER — Encounter: Payer: Medicare Other | Attending: Physician Assistant | Admitting: Physician Assistant

## 2021-12-11 DIAGNOSIS — I872 Venous insufficiency (chronic) (peripheral): Secondary | ICD-10-CM | POA: Diagnosis not present

## 2021-12-11 DIAGNOSIS — J45909 Unspecified asthma, uncomplicated: Secondary | ICD-10-CM | POA: Diagnosis not present

## 2021-12-11 DIAGNOSIS — I10 Essential (primary) hypertension: Secondary | ICD-10-CM | POA: Diagnosis not present

## 2021-12-11 DIAGNOSIS — I890 Lymphedema, not elsewhere classified: Secondary | ICD-10-CM | POA: Diagnosis not present

## 2021-12-11 DIAGNOSIS — L97812 Non-pressure chronic ulcer of other part of right lower leg with fat layer exposed: Secondary | ICD-10-CM | POA: Diagnosis not present

## 2021-12-11 DIAGNOSIS — I739 Peripheral vascular disease, unspecified: Secondary | ICD-10-CM | POA: Diagnosis not present

## 2021-12-11 DIAGNOSIS — I87331 Chronic venous hypertension (idiopathic) with ulcer and inflammation of right lower extremity: Secondary | ICD-10-CM | POA: Insufficient documentation

## 2021-12-11 DIAGNOSIS — L97811 Non-pressure chronic ulcer of other part of right lower leg limited to breakdown of skin: Secondary | ICD-10-CM | POA: Diagnosis not present

## 2021-12-11 NOTE — Progress Notes (Addendum)
[name], [name] ([id number]) Visit Report for 12/11/2021 Chief Complaint Document Details Patient Name: [name], [name]. Date of Service: 12/11/2021 2:15 PM Medical Record Number: [id number] Patient Account Number: [id number] Date of Birth/Sex: 03/04/1946 (76 y.o. F) Treating RN: Levora Dredge Primary Care Provider: [name] Other Clinician: Referring Provider: [name] Treating Provider/Extender: [name] in Treatment: 2 Information Obtained from: Patient Chief Complaint Right LE Ulcer Electronic Signature(s) Signed: 12/11/2021 2:12:58 PM By: Worthy Keeler PA-C Entered By: Worthy Keeler on 12/11/2021 14:12:58 Andal, [name] ([id number]) -------------------------------------------------------------------------------- HPI Details Patient Name: [name], [name]. Date of Service: 12/11/2021 2:15 PM Medical Record Number: [id number] Patient Account Number: [id number] Date of Birth/Sex: Mar 20, 1946 (76 y.o. F) Treating RN: Levora Dredge Primary Care Provider: [name] Other Clinician: Referring Provider: [name] Treating Provider/Extender: [name] in Treatment: 2 History of Present Illness HPI Description: Pleasant 76 year old with history of chronic venous insufficiency. No diabetes or peripheral vascular disease. Left ABI 1.29. Questionable history of left lower extremity DVT. She developed a recurrent ulceration on her left lateral calf in December 2015, which she attributes to poor diet and subsequent lower extremity edema. She underwent endovenous laser ablation of her left greater saphenous vein in 2010. She underwent laser ablation of accessory branch of left GSV in April 2016 by Dr. Kellie Simmering at [facility]. She was previously wearing Unna boots, which she tolerated well. Tolerating 2 layer compression and cadexomer iodine. She returns to clinic for follow-up and is without new complaints. She denies any significant pain at this  time. She reports persistent pain with pressure. No claudication or ischemic rest pain. No fever or chills. No drainage. READMISSION 11/13/16; this is a 76 year old woman who is not a diabetic. She is here for a review of a painful area on her left medial lower extremity. I note that she was seen here previously last year for wound I believe to be in the same area. At that time she had undergone previously a left greater saphenous vein ablation by Dr. Kellie Simmering and she had a ablation of the anterior accessory branch of the left greater saphenous vein in March 2016. Seeing that the wound actually closed over. In reviewing the history with her today the ulcer in this area has been recurrent. She describes a biopsy of this area in 2009 that only showed stasis physiology. She also has a history of today malignant melanoma in the right shoulder for which she follows with Dr. 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 Do not see that she has ever had arterial studies in the left leg. She tells me she has a follow-up with Dr. Kellie Simmering in roughly 10 days In any case she developed the reopening of this area roughly a month ago. On the background of this she describes rapidly increasing edema which has responded to Lasix 40 mg and metolazone 2.5 mg as well as the patient's lymph massage. She has been told she has both venous insufficiency and lymphedema but she cannot tolerate compression stockings 11/28/16; the patient saw Dr. Kellie Simmering recently. Per the patient he did arterial Dopplers in the office that did not show evidence of arterial insufficiency, per the patient he stated "treat this like an ordinary venous ulcer". She also saw her dermatologist Dr. Ronnald Ramp who felt that this was more of a vascular ulcer. In general things are improving although she  arrives today with increasing bilateral lower  extremity edema with weeping a deeper fluid through the wound on the left medial leg compatible with some degree of lymphedema 12/04/16; the patient's wound is fully epithelialized but I don't think fully healed. We will do another week of depression with Promogran and TCA however I suspect we'll be able to discharge her next week. This is a very unusual-looking wound which was initially a figure-of-eight type wound lying on its side surrounded by petechial like hemorrhage. She has had venous ablation on this side. She apparently does not have an arterial issue per Dr. Kellie Simmering. She saw her dermatologist thought it was "vascular". Patient is definitely going to need ongoing compression and I talked about this with her today she will go to elastic therapy after she leaves here next week 12/11/16; the patient's wound is not completely closed today. She has surrounding scar tissue and in further discussion with the patient it would appear that she had ulcers in this area in 2009 for a prolonged period of time ultimately requiring a punch biopsy of this area that only showed venous insufficiency. I did not previously pickup on this part of the history from the patient. 12/18/16; the patient's wound is completely epithelialized. There is no open area here. She has significant bilateral venous insufficiency with secondary lymphedema to a mild-to-moderate degree she does not have compression stockings.. She did not say anything to me when I was in the room, she told our intake nurse that she was still having pain in this area. This isn't unusual recurrent small open area. She is going to go to elastic therapy to obtain compression stockings. 12/25/16; the patient's wound is fully epithelialized. There is no open area here. The patient describes some continued episodic discomfort in this area medial left calf. However everything looks fine and healed here. She is been to elastic therapy and caught herself 15-20 mmHg  stockings, they apparently were having trouble getting 20-30 mm stockings in her size 01/22/17; this is a patient we discharged from the clinic a month ago. She has a recurrent open wound on her medial left calf. She had 15 mm support stockings. I told her I thought she needed 20-30 mm compression stockings. She tells me that she has been ill with hospitalization secondary to asthma and is been found to have severe hypokalemia likely secondary to a combination of Lasix and metolazone. This morning she noted blistering and leaking fluid on the posterior part of her left leg. She called our intake nurse urgently and we was saw her this afternoon. She has not had any real discomfort here. I don't know that she's been wearing any stockings on this leg for at least 2-3 days. ABIs in this clinic were 1.21 on the right and 1.3 on the left. She is previously seen vascular surgery who does not think that there is a peripheral arterial issue. 01/30/17; Patient arrives with no open wound on the left leg. She has been to elastic therapy and obtained 20-2mhg below knee stockings and she has one on the right leg today. READMISSION 02/19/18; this PMachois a now 76year old patient we've had in this clinic perhaps 3 times before. I had last looked at her from January 07 December 2016 with an area on the medial left leg. We discharged her on 12/25/16 however she had to be readmitted on 01/22/17 with a recurrence. I have in my notes that we discharged her on 20-30 mm stockings although she tells me  she was only wearing support hose because she cannot get stockings on predominantly related to her cervical spine surgery/issues. She has had previous ablations done by vein and vascular in [place] including a great saphenous vein ablation on the left with an anterior accessory branch ablation I think both of these were in 2016. On one of the previous visit she had a biopsy noted 2009 that was negative. She is not felt to  have an arterial issue. She is not a diabetic. She does have a history of obstructive sleep apnea hypertension asthma as well as chronic venous insufficiency and lymphedema. On this occasion she noted 2 dry scaly patch on her left leg. She tried to put lotion on this it didn't really help. There were 2 open areas.the patient has been seeing her primary physician from 02/05/18 through 02/14/18. She had Unna boots applied. The superior wound now on the lateral left leg has closed but she's had one wound that remains open on the lateral left leg. This is not the same spot as we dealt with in 2018. ABIs in this clinic were 1.3 bilaterally [name], [name] ([id number]) 02/26/18; patient has a small wound on the left lateral calf. Dimensions are down. She has chronic venous insufficiency and lymphedema. 03/05/18; small open area on the left lateral calf. Dimensions are down. Tightly adherent necrotic debris over the surface of the wound which was difficult to remove. Also the dressing [over collagen] stuck to the wound surface. This was removed with some difficulty as well. Change the primary dressing to Hydrofera Blue ready 03/12/18; small open area on the left lateral calf. Comes in with tightly adherent surface eschar as well as some adherent Hydrofera Blue. 03/19/18; open area on the left lateral calf. Again adherent surface eschar as well as some adherent Hydrofera Blue nonviable subcutaneous tissue. She complained of pain all week even with the reduction from 4-3 layer compression I put on last week. Also she had an increase in her ankle and calf measurements probably related to the same thing. 03/26/18; open area on the left lateral calf. A very small open area remains here. We used silver alginate starting last week as the Hydrofera Blue seem to stick to the wound bed. In using 4-layer compression 04/02/18; the open area in the left lateral calf at some adherent slough which I removed there is no open area  here. We are able to transition her into her own compression stocking. Truthfully I think this is probably his support hose. However this does not maintain skin integrity will be limited. She cannot put over the toe compression stockings on because of neck problems hand problems etc. She is allergic to the lining layer of juxta lites. We might be forced to use extremitease stocking should this fail READMIT 11/24/2018 Patient is now a 76 year old woman who is not a diabetic. She has been in this clinic on at least 3 previous occasions largely with recurrent wounds on her left leg secondary to chronic venous insufficiency with secondary lymphedema. Her situation is complicated by inability to get stockings on and an allergy to neoprene which is apparently a component and at least juxta lites and other stockings. As a result she really has not been wearing any stockings on her legs. She tells [place] that roughly 2 or 3 weeks ago she started noticing a stinging sensation just above her ankle on the left medial aspect. She has been diagnosed with pseudogout and she wondered whether this was what she was  experiencing. She tried to dress this with something she bought at the store however subsequently it pulled skin off and now she has an open wound that is not improving. She has been using Vaseline gauze with a cover bandage. She saw her primary doctor last week who put an Haematologist on her. ABIs in this clinic was 1.03 on the left 2/12; the area is on the left medial ankle. Odd-looking wound with what looks to be surface epithelialization but a multitude of small petechial openings. This clearly not closed yet. We have been using silver alginate under 3 layer compression with TCA 2/19; the wound area did not look quite as good this week. Necrotic debris over the majority of the wound surface which required debridement. She continues to have a multitude of what looked to be small petechial openings. She  reminds [place] that she had a biopsy on this initially during her first outbreak in 2015 in [place] dermat[facility]. She expresses concern about this being a possible melanoma. 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 I assured her that this does not look anything like melanoma. Except for the petechial reaction it does look like a venous insufficiency area and she certainly has evidence of this on both sides 2/26; a difficult area on the left medial ankle. The patient clearly has chronic venous hypertension with some degree of lymphedema. The odd thing about the area is the small petechial hemorrhages. I am not really sure how to explain this. This was present last time and this is not a compression injury. We have been using Hydrofera Blue which I changed to last week 3/4; still using Hydrofera Blue. Aggressive debridement today. She does not have known arterial issues. She has seen Dr. Kellie Simmering at [facility] vein and vascular and and has an ablation on the left. [Anterior accessory branch of the greater saphenous]. From what I remember they did not feel she had an arterial issue. The patient has had this area biopsied in 2009 at [facility] dermat[facility] and by her recollection they said this was "stasis". She is also follow-up with dermatology locally who thought that this was more of a vascular issue 3/11; using Hydrofera Blue. Aggressive debridement today. She does not have an arterial issue. We are using 3 layer compression although we may need to go to 4. The patient has been in for multiple changes to her wrap since I last saw her a week ago. She says that the area was leaking. I do not have too much more information on what was found 01/19/19 on evaluation today patient was actually being seen for a nurse visit when unfortunately she had the area on her left lateral lower extremity as well as weeping from the  right lower extremity that became apparent. Therefore we did end up actually seeing her for a full visit with myself. She is having some pain at this site as well but fortunately nothing too significant at this point. No fevers, chills, nausea, or vomiting noted at this time. 3/18-Patient is back to the clinic with the left leg venous leg ulcer, the ulcer is larger in size, has a surface that is densely adherent with fibrinous tissue, the Hydrofera Blue was used but is densely adherent and there was difficulty in removing it. The right lower extremity was also wrapped for weeping edema. Patient has a new area over the left lateral foot above the malleolus that is  small and appears to have no debris with intact surrounding skin. Patient is on increased dose of Lasix also as a means to edema management 3/25; the patient has a nonhealing venous ulcer on the medial left leg and last week developed a smaller area on the lateral left calf. We have been using Hydrofera Blue with a contact layer. 4/1; no major change in these wounds areas. Left medial and more recently left lateral calf. I tried Iodoflex last week to aid in debridement she did not tolerate this. She stated her pain was terrible all week. She took the top layer of the 4 layer compression off. 4/8; the patient actually looks somewhat better in terms of her more prominent left lateral calf wound. There is some healthy looking tissue here. She is still complaining of a lot of discomfort. 4/15; patient in a lot of pain secondary to sciatica. She is on a prednisone taper prescribed by her primary physician. She has the 2 areas one on the left medial and more recently a smaller area on the left lateral calf. Both of these just above the malleoli 4/22; her back pain is better but she still states she is very uncomfortable and now feels she is intolerant to the [employer]. No real change in the wounds we have been using Sorbact. She has been  previously intolerant to Iodoflex. There is not a lot of option about what we can use to debride this wound under compression that she no doubt needs. sHe states Ultram no longer works for her pain 4/29; no major change in the wounds slightly increased depth. Surface on the original medial wound perhaps somewhat improved however the more recent area on the lateral left ankle is 100% covered in very adherent debris we have been using Sorbact. She tolerates 4 layer compression well and her edema control is a lot better. She has not had to come in for a nurse check 5/6; no major change in the condition of the wounds. She did consent to debridement today which was done with some difficulty. Continuing Sorbact. She did not tolerate Iodoflex. She was in for a check of her compression the day after we wrapped her last week this was adjusted but nothing much was found 5/13; no major change in the condition or area of the wounds. I was able to get a fairly aggressive debridement done on the lateral left leg wound. Even using Sorbact under compression. She came back in on Friday to have the wrap changed. She says she felt uncomfortable on the lateral aspect of her ankle. She has a long history of chronic venous insufficiency including previous ablation surgery on this side. 5/20-Patient returns for wounds on left leg with both wounds covered in slough, with the lateral leg wound larger in size, she has been in 3 layer compression and felt more comfortable, she describes pain in ankle, in leg and pins and needles in foot, and is about to try Pamelor for this 6/3; wounds on the left lateral and left medial leg. The area medially which is the most recent of the 2 seems to have had the largest increase in [place], [name]. ([id number]) dimensions. We have been using Sorbac to try and debride the surface. She has been to see orthopedics they apparently did a plain x-ray that was indeterminant. Diagnosed her with  neuropathy and they have ordered an MRI to determine if there is underlying osteomyelitis. This was not high on my thought list but I suppose  it is prudent. We have advised her to make an appointment with vein and vascular in [place]. She has a history of a left greater saphenous and accessory vein ablations I wonder if there is anything else that can be done from a surgical point of view to help in these difficult refractory wounds. We have previously healed this wound on one occasion but it keeps on reopening [medial side] 6/10; deep tissue culture I did last week I think on the left medial wound showed both moderate E. coli and moderate staph aureus [MSSA]. She is going to require antibiotics and I have chosen Augmentin. We have been using Sorbact and we have made better looking wound surface on both sides but certainly no improvement in wound area. She was back in last Friday apparently for a dressing changes the wrap was hurting her outer left ankle. She has not managed to get a hold of vein and vascular in [place]. We are going to have to make her that appointment 6/17; patient is tolerating the Augmentin. She had an MRI that I think was ordered by orthopedic surgeon this did not show osteomyelitis or an abscess did suggest cellulitis. We have been using Sorbact to the lateral and medial ankles. We have been trying to arrange a follow-up appointment with vein and vascular in [place] or did her original ablations. We apparently an area sent the request to vein and vascular in [facility] 6/24; patient has completed the Augmentin. We do not yet have a vein and vascular appointment in [place]. I am not sure what the issue is here we have asked her to call tomorrow. We are using Sorbact. Making some improvements and especially the medial wound. Both surfaces however look better medial and lateral. 7/1; the patient has been in contact with vein and vascular in [place] but has not yet  received an appointment. Using Sorbact we have gradually improve the wound surface with no improvement in surface area. She is approved for Apligraf but the wound surface still is not completely viable. She has not had to come in for a dressing change 7/8; the patient has an appointment with vein and vascular on 7/31 which is a Friday afternoon. She is concerned about getting back here for [place] to dress her wounds. I think it is important to have them goal for her venous reflux/history of ablations etc. to see if anything else can be done. She apparently tested positive for 1 of the blood tests with regards to lupus and saw a rheumatologist. He has raised the issue of vasculitis again. I have had this thought in the past however the evidence seems overwhelming that this is a venous reflux etiology. If the rheumatologist tells me there is clinical and laboratory investigation is positive for lupus I will rethink this. 7/15; the patient's wound surfaces are quite a bit better. The medial area which was her original wound now has no depth although the lateral wound which was the more recent area actually appears larger. Both with viable surfaces which is indeed better. Using Sorbact. I wanted to use Apligraf on her however there is the issue of the vein and vascular appointment on 7/31 at 2:00 in the afternoon which would not allow her to get back to be rewrapped and they would no doubt remove the graft 7/22; the patient's wound surfaces have moderate amount of debris although generally look better. The lateral one is larger with 2 small satellite areas superiorly. We are waiting for her vein and  vascular appointment on 7/31. She has been approved for Apligraf which I would like to use after th 7/29; wound surfaces have improved no debridement is required we have been using Sorbact. She sees vein and vascular on Friday with this so question of whether anything can be done to lessen the likelihood of  recurrence and/or speed the healing of these areas. She is already had previous ablations. She no doubt has severe venous hypertension 8/5-Patient returns at 1 week, she was in [place] for 3 days by her podiatrist, we have been using so backed to the wound, she has increased pain in both the wounds on the left lower leg especially the more distal one on the lateral aspect 8/12-Patient returns at 1 week and she is agreeable to having debridement in both wounds on her left leg today. We have been using Sorbact, and vascular studies were reviewed at last visit 8/19; the patient arrives with her wounds fairly clean and no debridement is required. We have used Sorbact which is really done a nice job in cleaning up these very difficult wound surfaces. The patient saw Dr. Donzetta Matters of vascular surgery on 7/31. He did not feel that there was an arterial component. He felt that her treated greater saphenous vein is adequately addressed and that the small saphenous vein did not appear to be involved significantly. She was also noted to have deep venous reflux which is not treatable. Dr. Donzetta Matters mentioned the possibility of a central obstructive component leading to reflux and he offered her central venography. She wanted to discuss this or think about it. I have urged her to go ahead with this. She has had recurrent difficult wounds in these areas which do heal but after months in the clinic. If there is anything that can be done to reduce the likelihood of this I think it is worth it. 9/2 she is still working towards getting follow-up with Dr. Donzetta Matters to schedule her CT. Things are quite a bit worse venography. I put Apligraf on 2 weeks ago on both wounds on the medial and lateral part of her left lower leg. She arrives in clinic today with 3 superficial additional wounds above the area laterally and one below the wound medially. She describes a lot of discomfort. I think these are probably wrapped injuries. Does not  look like she has cellulitis. 07/20/2019 on evaluation today patient appears to be doing somewhat poorly in regard to her lower extremity ulcers. She in fact showed signs of erythema in fact we may even be dealing with an infection at this time. Unfortunately I am unsure if this is just infection or if indeed there may be some allergic reaction that occurred as a result of the Apligraf application. With that being said that would be unusual but nonetheless not impossible in this patient is one who is unfortunately allergic to quite a bit. Currently we have been using the Sorbact which seems to do as well as anything for her. I do think we may want to obtain a culture today to see if there is anything showing up there that may need to be addressed. 9/16; noted that last week the wounds look worse in 1 week follow-up of the Apligraf. Using Sorbact as of 2 days ago. She arrives with copious amounts of drainage and new skin breakdown on the back of the left calf. The wounds arm more substantial bilaterally. There is a fair amount of swelling in the left calf no overt DVT there  is edema present I think in the left greater than right thigh. She is supposed to go on 9/28 for CT venography. The wounds on the medial and lateral calf are worse and she has new skin breakdown posteriorly at least new for me. This is almost developing into a circumferential wound area The Apligraf was taken off last week which I agree with things are not going in the right direction a culture was done we do not have that back yet. She is on Augmentin that she started 2 days ago 9/23; dressing was changed by her nurses on Monday. In general there is no improvement in the wound areas although the area looks less angry than last week. She did get Augmentin for MSSA cultured on the 14th. She still appears to have too much swelling in the left leg even with 3 layer compression 9/30; the patient underwent her procedure on 9/28 by Dr.  Donzetta Matters at [facility]. She was discovered to have the common iliac vein measuring 12.2 mm but at the level of L4-L5 measured 3 mm. After stenting it measured 10 mm. 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 Rouleaux flow in the common femoral and femoral vein was observed much improved after stenting. We are using silver alginate to the wounds on the medial and lateral ankle on the left. 4 layer compression 10/7; the patient had fluid swelling around her knee and 4 layer compression. At the advice of vein and vascular this was reduced to 3 layer which she is tolerating better. We have been using silver alginate under 3 layer compression since last Friday 10/14; arrives with the areas on the left ankle looking a lot better. Inflammation in the area also a lot better. She came in for a nurse check on 10/9 10/21; continued nice improvement. Slight improvements in surface area of both the medial and lateral wounds on the left. A lot of the satellite lesions in the weeping erythema around these from stasis dermatitis is resolved. We have been using silver alginate NATOSHA, BOU ([id number]) 10/28; general improvement in the entire wound areas although not a lot of change in dimensions the wound certainly looks better. There is a lot less in terms of venous inflammation. Continue silver alginate this week however look towards Hydrofera Blue next week 11/4; very adherent debris on the medial wound left wound is not as bad. We have been using silver alginate. Change to [facility] today 11/11; very adherent debris on both wound areas. She went to vein and vascular last week and follow-up they put in [place] boot on this today. He says the [facility] was adherent. Wound is definitely not as good as last week. Especially on the left there the satellite lesions look more prominent 11/18; absolutely no better. erythema on lateral aspect with tenderness. 09/30/2019 on  evaluation today patient appears to actually be doing better. Dr. Dellia Nims did put her on doxycycline last week which I do believe has helped her at this point. Fortunately there is no signs of active infection at this time. No fevers, chills, nausea, vomiting, or diarrhea. I do believe he may want extend the doxycycline for 7 additional days just to ensure everything does completely cleared up the patient is in agreement with that plan. Otherwise she is going require some sharp debridement today 12/2; patient is completing a 2-week course of doxycycline. I gave her this empirically for inflammation as well as infection when I last saw her  2 weeks ago. All of this seems to be better. She is using silver alginate she has the area on the medial aspect of the larger area laterally and the 2 small satellite regions laterally above the major wound. 12/9; the patient's wound on the left medial and left lateral calf look really quite good. We have been using silver alginate. She saw vein and vascular in follow-up on 10/09/2019. She has had a previous left greater saphenous vein ablation by Dr. Oscar La in 2016. More recently she underwent a left common iliac vein stent by Dr. 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 The swelling is improved and certainly the wounds have improved. The patient shows [place] today area on the right medial calf there is almost no wound but leaking lymphedema. She says she start this started 3 or 4 days ago. She did not traumatize it. It is not painful. She does not wear compression on that side 12/16; the patient continues to do well laterally. Medially still requiring debridement. The area on the right calf did not materialize to anything and is not currently open. We wrapped this last time. She has support stockings for that leg although I am not sure they are going to provide adequate compression 12/23; the lateral wound looks stable. Medially still requiring debridement for  tightly adherent fibrinous debris. We've been using silver alginate. Surface area not any different 12/30; neither wound is any better with regards to surface and the area on the left lateral is larger. I been using silver alginate to the left lateral which look quite good last week and Sorbact to the left medial 11/11/2019. Lateral wound area actually looks better and somewhat smaller. Medial still requires a very aggressive debridement today. We have been using Sorbact on both wound areas 1/13; not much better still adherent debris bilaterally. I been using Sorbact. She has severe venous hypertension. Probably some degree of dermal fibrosis distally. I wonder whether tighter compression might help and I am going to try that today. We also need to work on the bioburden 1/20; using Sorbact. She has severe venous hypertension status post stent placement for pelvic vein compression. We applied gentamicin last time to see if we could reduce bioburden I had some discussion with her today about the use of pentoxifylline. This is occasionally used in this setting for wounds with refractory venous insufficiency. However this interacts with Plavix. She tells me that she was put on this after stent placement for 3 months. She will call Dr. Claretha Cooper office to discuss 1/27; we are using gentamicin under Sorbact. 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 She has a stent. Wound medially is measuring smaller this week. Laterally measuring slightly larger although she has some satellite lesions superiorly 2/3; gentamicin under Sorbact under 4-layer compression. 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 She has a stent on Plavix. Her wounds are measuring smaller this week. More substantially laterally where there is a satellite lesion superiorly. 2/10; gentamicin under Sorbac. 4-layer compression. Patient communicated with Dr. Donzetta Matters at vein and vascular in  [place]. He is okay with the patient coming off Plavix I will therefore start her on pentoxifylline for a 1 month trial. In general her wounds look better today. I had some concerns about swelling in the left thigh however she measures 61.5 on the right and 63 on the mid thigh which does not suggest there is any difficulty. The patient is not describing any pain. 2/17; gentamicin under  Sorbac 4-layer compression. She has been on pentoxifylline for 1 week and complains of loose stool. No nausea she is eating and drinking well 2/24; the patient apparently came in 2 days ago for a nurse visit when her wrap fell down. Both areas look a little worse this week macerated medially and satellite lesions laterally. Change to silver alginate today 3/3; wounds are larger today especially medially. She also has more swelling in her foot lower leg and I even noted some swelling in her posterior thigh which is tender. I wonder about the patency of her stent. Fortuitously she sees Dr. Claretha Cooper group on Friday 3/10; Mrs. Guerrier was seen by vein and vascular on 3/5. The patient underwent ultrasound. There was no evidence of thrombosis involving the IVC no evidence of thrombosis involving the right common iliac vein there is no evidence of thrombosis involving the right external iliac vein the left external vein is also patent. The right common iliac vein stent appears patent bilateral common femoral veins are compressible and appear patent. I was concerned about the left common iliac stent however it looks like this is functional. She has some edema in the posterior thigh that was tender she still has that this week. I also note they had trouble finding the pulses in her left foot and booked her for an ABI baseline in 4 weeks. She will follow up in 6 months for repeat IVC duplex. The patient stopped the pentoxifylline because of diarrhea. It does not look like that was being effective in any case. I have advised her  to go back on her aspirin 81 mg tablet, vascular it also suggested this 3/17; comes in today with her wound surfaces a lot better. The excoriations from last week considerably better probably secondary to the TCA. We have been using silver alginate 3/24; comes in today with smaller wounds both medially and laterally. Both required debridement. There are 2 small satellite areas superiorly laterally. She also has a very odd bandlike area in the mid calf almost looking like there was a weakness in the wrap in a localized area. I would write this off as being this however anteriorly she has a small raised ballotable area that is very tender almost reminiscent of an abscess but there was no obvious purulent surface to it. 02/04/20 upon evaluation today patient appears to be doing fairly well in regard to her wounds today. Fortunately there is no signs of active infection at this time. No fevers, chills, nausea, vomiting, or diarrhea. She has been tolerating the dressing changes without complication. Fortunately I feel like she is showing signs of improvement although has been sometime since have seen her. Nonetheless the area of concern that Dr. Dellia Nims had last week where she had possibly an area of the wrap that was we can allow the leg to bulge appears to be doing significantly better today there is no signs of anything worsening. [name], [name] ([id number]) 4/7; the patient's wounds on her medial and lateral left leg continue to contract. We have been using a regular alginate. Last week she developed an area on the right medial lower leg which is probably a venous ulcer as well. 4/14; the wounds on her left medial and lateral lower leg continue to contract. Surface eschar. We have been using regular alginate. The area on the right medial lower leg is closed. We have been putting both legs under 4-layer contraction. The patient went back to see vein and vascular she had arterial studies done  which  were apparently "quite good" per the patient although I have not read their notes I have never felt she had an arterial issue. 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 This is been longstanding and refractory to exercise, leg elevation and longstanding use of compression wraps in our clinic as well as compression stockings on the times we have been able to get these to heal 4/21; we thought she actually might be close this week however she arrives in clinic with a lot of edema in her upper left calf and into her posterior thigh. This is been an intermittent problem here. She says the wrap fell down but it was replaced with a nurse visit on Monday. We are using calcium alginate to the wounds and the wound sizes there not terribly larger than last week but there is a lot more edema 4/28; again wound edges are smaller on both sides. Her edema is better controlled than last time. She is obtained her compression pumps from [employer] although they have not been to her home to set these up. 5/5; left medial and left lateral both look stable. I am not sure the medial is any smaller. We have been using calcium alginate under 4-layer compression. oShe had an area on the right medial. This was eschared today. We have been wrapping this as well. She does not tolerate external compression stockings due to a history of various contact allergies. She has her compression pumps however the representative from the company is coming on her to show her how to use these tomorrow 5/19; patient with severe chronic venous insufficiency secondary to central venous disease. She had a stent placed in her left common iliac vein. She has done better since but still difficult to control wounds. She comes in today with nothing open on the right leg. Her areas on the left medial and left lateral are just about closed. We are using calcium alginate under 4-layer compression.  She is using her external compression pumps at home She only has 15-20 support stockings. States she cannot get anything tighter than that on. 03/30/20-Patient returns at 1 week, the wounds on the left leg are both slightly bigger, the last week she was on 3 layer compression which started to slide down. She is starting to use her lymphedema pumps although she stated on 1 day her right ankle started to swell up and she have to stop that day. Unfortunately the open area seem to oscillate between improving to the point of healing and then flaring up all to do with effectiveness of compression or lack of due to the left leg topography not keeping the compression wraps from rolling down 6/2 patient comes in with a 15/20 mmHg stocking on the right leg. She tells me that she developed a lot of swelling in her ankles she saw orthopedics she was felt to possibly be having a flare of pseudogout versus some other type of arthritis. She was put on steroids for a respiratory issue so that helps with the inflammation. She has not been using the pumps all week. She thinks the left thigh is more swollen than usual and I would agree with that. She has an appointment with Dr. Donzetta Matters 9 days or so from now 6/9; both wounds on the left medial and left lateral are smaller. We have been using calcium alginate under compression. She does not have an open wound on the right leg she is using a stocking and her  compression pumps things are going well. She has an appointment with Dr. Donzetta Matters with regards to her stent in the left common iliac vein 6/16; the wounds on the left medial and left lateral ankle continues to contract. The patient saw Dr. Donzetta Matters and I think he seems satisfied. Ordered follow-up venous reflux studies on both sides in September. Cautioned that she may need thigh-high stockings. She has been using calcium alginate under compression on the left and her own stocking on the right leg. She tells [place] there are no open  wounds on the right 6/23; left lateral is just about closed. Medial required debridement today. We have been using calcium alginate. Extensive discussion about the compression pumps she is only using these on 25 mmHg states she could not take 40 or 30 when the wrap came out to her home to demonstrate these. He said they should not feel tight 6/30; the left lateral wound has a slight amount of eschar. . The area medially is about the same using Hydrofera Blue. 7/7; left lateral wound still has some eschar. I will remove this next week may be closed. The area medially is very small using Hydrofera Blue with improvement. Unfortunately the stockings fell down. Unfortunately the blisters have developed at the edge of where the wrap fell. When this happened she says her legs hurt she did not use her pumps. We are not open Monday for her to come in and change the wraps and she had an appointment yesterday. She also tells me that she is going to have an MRI of her back. She is having pain radiating into her left anterior leg she thinks her from an L5 disc. She saw Dr. Ellene Route of neurosurgery 7/14; the area on the left lateral ankle area is closed. Still a small area medially however it looks better as well. We have been using Hydrofera Blue under 4-layer compression 7/21; left lateral ankle is still closed however her wound on the medial left calf is actually larger. This is probably because Hydrofera Blue got stuck to the wound. She came in for a nurse change on Friday and will do that again this week I was concerned about the amount of swelling that she had last week however she is using her compression pumps twice a day and the swelling seems well controlled 7/28; remaining wound on the left medial lower leg is smaller. We have been using moistened silver collagen under compression she is coming back for a nurse visit. For reasons that were not really clear she was just keeping her legs elevated and not  using her compression pumps. I have asked her to use the compression pumps. She does not have any wounds on the right leg 06/15/20-Patient returns at 2 weeks, her LLE edema is worse and she developed a blister wound that is new and has bigger posterior calf wound on right, we are using Prisma with pad, 4 layer compression. she has been on lasix 40 mg daily 8/18; patient arrives today with things a lot worse than I remember from a few weeks ago. She was seen last week. Noted that her edema was worse and that she now had a left lateral wound as well as deteriorating edema in the medial and posterior part of the lower leg. She says she is using his or her external compression pumps once a day although I wonder about the compliance. 8/25; weeping area on the right medial lower leg. This had actually gotten a small localized area  of her compression stocking wet. oOn the left side there is a large denuded area on the posterior medial lower leg and smaller area on the lateral. This was not the original areas that we dealt with. 9/1 the patient's wound on the left leg include the left lateral and left posterior. Larger superficial wounds weeping. She has very poor edema control. Tender localized edema in the left lower medial ankle/heel probably because of localized wrap issues. She freely admits she is not using the compression pumps. She has been up on her feet a lot. She thinks the hydrofera blue is contributing to the pain she is experiencing.. This is a complaint that I have occasionally heard 9/8; really not much improvement. The patient is still complaining of a lot of pain particularly when she uses compression pumps. I switched her to silver alginate last time because she found the Hydrofera Blue to be irritating. I don't hear much difference in her description with the silver alginate. 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 She has follow-up with Dr. Donzetta Matters I think for ultrasound next month [name], [name] ([id number]) 9/15; quite a bit of improvement today. We have less edema and more epithelialization in both of her wound areas on the left medial and left lateral calf. These are not the site of her original wounds in this area. She says she has been using her compression pumps for 30 minutes twice a day, there pain issues that never quite understood. Silver alginate as the primary dressing 9/22; continued improvement. Both areas medially and laterally still have a small open area there is some eschar. She continues to complain of left medial ankle pain. Swelling in the leg is in much better condition. We have been using silver alginate 9/29; continued improvement. Both areas medially and laterally in the left calf look as though they are close some minor surface eschar but I think this is epithelialized. She comes in today saying she has a ruptured disc at L4-L5 cannot bend over to put on her stockings. 10/6; patient comes in today with no open wounds on either leg. However her edema on the left leg in the upper one third of the lower leg is poorly controlled nonpitting. She says that she could not use the pumps for 2 days and then she has been using the last couple of days. It is not clear to me she has been able to get her stocking on. She has back problems. Mrs. Lukes has severe chronic venous insufficiency with secondary lymphedema. Her venous insufficiency is partially centrally mediated and that she is now post stent in the left common iliac vein. [facility] Thurner's syndrome/physiology]. She follows up with them on 10/15. She wears 20/30 below-knee stockings. She is supposed to use compression pumps at home although I think her compliance about with this is been less than 100%. I have asked her to use these 3 times a day. Finally I think she has lipodermatosclerosis in the left lower leg with an inverted  bottle sign. It is been a major problem controlling the edema in the left leg. The right leg we have had wounds on but not as significant a problem is on the left READMISSION 04/12/2021 Mrs. Wasser is a 76 year old woman we know well in this clinic. She has severe chronic venous insufficiency. 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 I believe she has had bilateral greater saphenous vein ablation in the past as well. She tells me that this wound opened sometime in March. She had a fall and thinks it was initially abrasion. She developed areas she describes as little blisters on the anterior part of her leg and she saw dermatology and was treated for methicillin staph aureus with several rounds of antibiotics. She has been using support stockings on the left leg and says this is the only thing she can get on. Her compression pump use maybe once a day she says if she did not use one she use the other. She comes in today with incredible swelling in the left leg with a wound on the left posterior calf. She has been using Neosporin to this previously a hydrocolloid. 6/15; patient arrives back for 1 week follow-up.Marland Kitchen Apparently her wrap fell down she did not call us to replace this. He has poor edema control. She only uses her compression pumps once a day 6/29; patient presents for 1 week follow-up. She has tolerated the compression wrap well. We have been using Iodoflex under the wrap. She has no issues or complaints today. 7/6; patient presents for 1 week follow-up. She states that the compression wrap rolled down her leg 4 days ago. She has been trying to keep the area covered but has no dressings at home to use. She denies signs of infection. 7/13; patient presents for 1 week follow-up. She states that her compression wrap rolled down her right leg and she called our office and had it placed a few days ago. She has been tolerating the current wrap well. She  states that the Iodoflex is causing a burning sensation. She denies signs of infection. 7/27; patient presents for 1 week follow-up She has tolerated the compression wrap well with Hydrofera Blue underneath. She has now developed a wound to her right lower extremity. She reports having a culture done To this area by her dermatologist that she reports is negative. She currently denies signs of infection. 8/30; patient presents for 1 week follow-up. On the left side she has tolerated the compression wrap well with Hydrofera Blue underneath. She reports some discomfort to her right lower extremity with the 3 layer compression. She currently denies signs of infection. 06/14/2021 upon evaluation today patient's wound actually appears to be doing decently well based on what I am seeing currently. She actually has 2 areas 1 on the left distal/posterior lower leg and the other on the right lower leg. Subsequently the measurements are roughly the same may be slightly smaller but in general have not made any trend towards getting worse which is great news. 06/23/2021 upon evaluation today patient appears to be doing pretty well in regard to her wounds. On the right she is having difficulty with sciatic pain and this subsequently has led to her taking the wrap off over the last week her leg is more swollen she is also been on prednisone for 10 days. With that being said I think we may want to just use a compression sock on the left that way she will not have to worry about the compression wrap being in place that she cannot get off. With that being said I do believe as well that the patient is going to need to continue with the wrapping on the left we will also need to do a little bit of sharp debridement today. 8/24; patient presents for 1 week follow-up. She has no issues  or complaints today. She denies signs of infection. 8/31; patient presents for 1 week follow-up. She has no issues or complaints today. She  has tolerated the compression wrap well on the left lower extremity. She denies signs of infection. 9/7; patient presents for follow-up. She reports that the compression wrap rolled down slightly on her right lower extremity. She reports tenderness to the wound bed. She denies increased warmth or erythema to the surrounding skin. She overall feels well. 9/14; patient presents for follow-up. She has no issues or complaints today. She denies signs of infection. PuraPly is available and patient would like to start this today. 9/21; patient presents for follow-up. She has no issues or complaints today. She tolerated the first skin substitute placement last week under compression. 9/28; patient presents for 1 week follow-up. She has no issues or complaints today. 10/5; patient presents for 1 week follow-up. She reports taking the wrap off yesterday. She was on her feet more yesterday and developed more swelling to her left lower extremity. She denies pain. Overall she is doing well. 10/12; patient presents for 1 week follow-up. She has no issues or complaints today. 10/19; patient presents for follow-up. She has no issues or complaints today. She tolerated the compression wrap well. She states she has compression stockings at home. [name], [name] ([id number]) Readmission: 11/27/2021 upon evaluation today patient appears to be doing poorly in regard to the wounds on her right leg. She also has an area of erythema in the proximal calf area of the right leg which again I do believe is a issue from the standpoint of it being warm and erythematous to touch and also seems to be somewhat localized I really think this may be more of a cellulitis issue than anything else. Fortunately I do not see any signs of active infection Systemically at this time. 12/05/2021 upon evaluation patient's wounds actually appear to be showing some signs of improvement which is not too bad and just 1 week's time. Especially  since we had to get on the right antibiotic which is only been for the past 4 days. Fortunately I do not see any signs of active infection locally nor systemically at this time that has gotten any worse. Overall I think that again with the antibiotic she is significantly improved and currently she is taking Levaquin. 12/11/2021 upon evaluation today patient appears to be doing well with regard to her leg. Between the antibiotics and the triamcinolone I feel like she is doing significantly better which is great news. Overall I am extremely pleased with where we stand. There does not appear to be any signs of active infection locally or systemically at this time. Electronic Signature(s) Signed: 12/11/2021 3:33:54 PM By: Worthy Keeler PA-C Entered By: Worthy Keeler on 12/11/2021 15:33:53 Pinckney, [name] ([id number]) -------------------------------------------------------------------------------- Physical Exam Details Patient Name: [name], [name]. Date of Service: 12/11/2021 2:15 PM Medical Record Number: [id number] Patient Account Number: [id number] Date of Birth/Sex: 05-18-1946 (76 y.o. F) Treating RN: Levora Dredge Primary Care Provider: [name] Other Clinician: Referring Provider: [name] Treating Provider/Extender: Jeri Cos Weeks in Treatment: 2 Constitutional [name] and well-hydrated in no acute distress. Respiratory normal breathing without difficulty. Psychiatric this patient is able to make decisions and demonstrates good insight into disease process. Alert and Oriented x 3. pleasant and cooperative. Notes Upon inspection patient's wound bed actually showed signs of good granulation and epithelization at this point. There was a lot of dry and flaky skin that was coming  off although not all of it wanted to come off super easily. Underneath there appeared to be good healed skin over the majority of the ankle region. Subsequently I do believe that we are  very close to getting this resolved actually and even from last week to this week this is a tremendous improvement. Electronic Signature(s) Signed: 12/11/2021 3:34:28 PM By: Worthy Keeler PA-C Entered By: Worthy Keeler on 12/11/2021 15:34:27 Kontos, [name] ([id number]) -------------------------------------------------------------------------------- Physician Orders Details Patient Name: [name], [name]. Date of Service: 12/11/2021 2:15 PM Medical Record Number: [id number] Patient Account Number: [id number] Date of Birth/Sex: [birth year] (76 y.o. F) Treating RN: Levora Dredge Primary Care Provider: [name] Other Clinician: Referring Provider: [name] Treating Provider/Extender: [name] in Treatment: 2 Verbal / Phone Orders: No Diagnosis Coding ICD-10 Coding Code Description I89.0 Lymphedema, not elsewhere classified I87.331 Chronic venous hypertension (idiopathic) with ulcer and inflammation of right lower extremity L97.811 Non-pressure chronic ulcer of other part of right lower leg limited to breakdown of skin Follow-up Appointments o Return Appointment in 1 week. o Nurse Visit as needed Bathing/ Shower/ Hygiene o May shower with wound dressing protected with water repellent cover or cast protector. Wound Treatment Wound #15 - Lower Leg Wound Laterality: Right, Circumferential Cleanser: Soap and Water Discharge Instructions: Gently cleanse wound with antibacterial soap, rinse and pat dry prior to dressing wounds Topical: Triamcinolone Acetonide Cream, 0.1%, 15 (g) tube Discharge Instructions: Apply as directed by provider. apply to wound and leg Primary Dressing: Xtrasorb Classic Super Absorbent Dressing, 6x9 (in/in) Discharge Instructions: over open areas of the leg that are weeping Compression Wrap: Unna-Z Zinc and Calamine Boot, 4x10 (in/yd) Discharge Instructions: Use to anchor wrap in place. Apply one strip around leg 3-finger widths below  the knee to secure the wrap. Compression Wrap: Profore Lite LF 3 Multilayer Compression Bandaging System Discharge Instructions: Apply 3 multi-layer wrap as prescribed. Electronic Signature(s) Signed: 12/11/2021 5:12:29 PM By: Levora Dredge Signed: 12/12/2021 10:02:31 AM By: Worthy Keeler PA-C Entered By: Levora Dredge on 12/11/2021 15:14:20 [name], [name] ([id number]) -------------------------------------------------------------------------------- Problem List Details Patient Name: [name], [name]. Date of Service: 12/11/2021 2:15 PM Medical Record Number: [id number] Patient Account Number: [id number] Date of Birth/Sex: 05-29-1946 (76 y.o. F) Treating RN: Levora Dredge Primary Care Provider: [name] Other Clinician: Referring Provider: [name] Treating Provider/Extender: [name] in Treatment: 2 Active Problems ICD-10 Encounter Code Description Active Date MDM Diagnosis I89.0 Lymphedema, not elsewhere classified 11/27/2021 No Yes I87.331 Chronic venous hypertension (idiopathic) with ulcer and inflammation of 11/27/2021 No Yes right lower extremity L97.811 Non-pressure chronic ulcer of other part of right lower leg limited to 11/27/2021 No Yes breakdown of skin Inactive Problems Resolved Problems Electronic Signature(s) Signed: 12/11/2021 2:12:54 PM By: Worthy Keeler PA-C Entered By: Worthy Keeler on 12/11/2021 14:12:54 Tew, [name] ([id number]) -------------------------------------------------------------------------------- Progress Note Details Patient Name: ANNLOUISE, GERETY. Date of Service: 12/11/2021 2:15 PM Medical Record Number: [id number] Patient Account Number: [id number] Date of Birth/Sex: 02/27/1946 (76 y.o. F) Treating RN: Levora Dredge Primary Care Provider: [name] Other Clinician: Referring Provider: [name] Treating Provider/Extender: [name] in Treatment: 2 Subjective Chief Complaint Information  obtained from Patient Right LE Ulcer History of Present Illness (HPI) Pleasant 76 year old with history of chronic venous insufficiency. No diabetes or peripheral vascular disease. Left ABI 1.29. Questionable history of left lower extremity DVT. She developed a recurrent ulceration on her left lateral calf in December 2015, which she attributes to poor  diet and subsequent lower extremity edema. She underwent endovenous laser ablation of her left greater saphenous vein in 2010. She underwent laser ablation of accessory branch of left GSV in April 2016 by Dr. Kellie Simmering at [facility]. She was previously wearing Unna boots, which she tolerated well. Tolerating 2 layer compression and cadexomer iodine. She returns to clinic for follow-up and is without new complaints. She denies any significant pain at this time. She reports persistent pain with pressure. No claudication or ischemic rest pain. No fever or chills. No drainage. READMISSION 11/13/16; this is a 76 year old woman who is not a diabetic. She is here for a review of a painful area on her left medial lower extremity. I note that she was seen here previously last year for wound I believe to be in the same area. At that time she had undergone previously a left greater saphenous vein ablation by Dr. Kellie Simmering and she had a ablation of the anterior accessory branch of the left greater saphenous vein in March 2016. Seeing that the wound actually closed over. In reviewing the history with her today the ulcer in this area has been recurrent. She describes a biopsy of this area in 2009 that only showed stasis physiology. She also has a history of today malignant melanoma in the right shoulder for which she follows with Dr. 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 Do not see that she has ever had arterial studies in the left leg. She tells me  she has a follow-up with Dr. Kellie Simmering in roughly 10 days In any case she developed the reopening of this area roughly a month ago. On the background of this she describes rapidly increasing edema which has responded to Lasix 40 mg and metolazone 2.5 mg as well as the patient's lymph massage. She has been told she has both venous insufficiency and lymphedema but she cannot tolerate compression stockings 11/28/16; the patient saw Dr. Kellie Simmering recently. Per the patient he did arterial Dopplers in the office that did not show evidence of arterial insufficiency, per the patient he stated "treat this like an ordinary venous ulcer". She also saw her dermatologist Dr. Ronnald Ramp who felt that this was more of a vascular ulcer. In general things are improving although she arrives today with increasing bilateral lower extremity edema with weeping a deeper fluid through the wound on the left medial leg compatible with some degree of lymphedema 12/04/16; the patient's wound is fully epithelialized but I don't think fully healed. We will do another week of depression with Promogran and TCA however I suspect we'll be able to discharge her next week. This is a very unusual-looking wound which was initially a figure-of-eight type wound lying on its side surrounded by petechial like hemorrhage. She has had venous ablation on this side. She apparently does not have an arterial issue per Dr. Kellie Simmering. She saw her dermatologist thought it was "vascular". Patient is definitely going to need ongoing compression and I talked about this with her today she will go to elastic therapy after she leaves here next week 12/11/16; the patient's wound is not completely closed today. She has surrounding scar tissue and in further discussion with the patient it would appear that she had ulcers in this area in 2009 for a prolonged period of time ultimately requiring a punch biopsy of this area that only showed venous insufficiency. I did  not  previously pickup on this part of the history from the patient. 12/18/16; the patient's wound is completely epithelialized. There is no open area here. She has significant bilateral venous insufficiency with secondary lymphedema to a mild-to-moderate degree she does not have compression stockings.. She did not say anything to me when I was in the room, she told our intake nurse that she was still having pain in this area. This isn't unusual recurrent small open area. She is going to go to elastic therapy to obtain compression stockings. 12/25/16; the patient's wound is fully epithelialized. There is no open area here. The patient describes some continued episodic discomfort in this area medial left calf. However everything looks fine and healed here. She is been to elastic therapy and caught herself 15-20 mmHg stockings, they apparently were having trouble getting 20-30 mm stockings in her size 01/22/17; this is a patient we discharged from the clinic a month ago. She has a recurrent open wound on her medial left calf. She had 15 mm support stockings. I told her I thought she needed 20-30 mm compression stockings. She tells me that she has been ill with hospitalization secondary to asthma and is been found to have severe hypokalemia likely secondary to a combination of Lasix and metolazone. This morning she noted blistering and leaking fluid on the posterior part of her left leg. She called our intake nurse urgently and we was saw her this afternoon. She has not had any real discomfort here. I don't know that she's been wearing any stockings on this leg for at least 2-3 days. ABIs in this clinic were 1.21 on the right and 1.3 on the left. She is previously seen vascular surgery who does not think that there is a peripheral arterial issue. 01/30/17; Patient arrives with no open wound on the left leg. She has been to elastic therapy and obtained 20-65mmhg below knee stockings and she has one on the right  leg today. READMISSION 02/19/18; this Folkes is a now 76 year old patient we've had in this clinic perhaps 3 times before. I had last looked at her from January 07 December 2016 with an area on the medial left leg. We discharged her on 12/25/16 however she had to be readmitted on 01/22/17 with a recurrence. I have in my notes that we discharged her on 20-30 mm stockings although she tells me she was only wearing support hose because she cannot get stockings on predominantly related to her cervical spine surgery/issues. She has had previous ablations done by vein and vascular in [place] including a great saphenous vein ablation on the left with an anterior accessory branch ablation I think both of these were in 2016. On one of the previous visit she had a biopsy noted 2009 that was negative. She is not felt to have an arterial issue. She is not a diabetic. She does have a history of obstructive sleep apnea hypertension asthma as well as chronic venous insufficiency and lymphedema. JENEAN, ESCANDON ([id number]) On this occasion she noted 2 dry scaly patch on her left leg. She tried to put lotion on this it didn't really help. There were 2 open areas.the patient has been seeing her primary physician from 02/05/18 through 02/14/18. She had Unna boots applied. The superior wound now on the lateral left leg has closed but she's had one wound that remains open on the lateral left leg. This is not the same spot as we dealt with in 2018. ABIs in this clinic were 1.3  bilaterally 02/26/18; patient has a small wound on the left lateral calf. Dimensions are down. She has chronic venous insufficiency and lymphedema. 03/05/18; small open area on the left lateral calf. Dimensions are down. Tightly adherent necrotic debris over the surface of the wound which was difficult to remove. Also the dressing [over collagen] stuck to the wound surface. This was removed with some difficulty as well. Change the primary dressing to  Hydrofera Blue ready 03/12/18; small open area on the left lateral calf. Comes in with tightly adherent surface eschar as well as some adherent Hydrofera Blue. 03/19/18; open area on the left lateral calf. Again adherent surface eschar as well as some adherent Hydrofera Blue nonviable subcutaneous tissue. She complained of pain all week even with the reduction from 4-3 layer compression I put on last week. Also she had an increase in her ankle and calf measurements probably related to the same thing. 03/26/18; open area on the left lateral calf. A very small open area remains here. We used silver alginate starting last week as the Hydrofera Blue seem to stick to the wound bed. In using 4-layer compression 04/02/18; the open area in the left lateral calf at some adherent slough which I removed there is no open area here. We are able to transition her into her own compression stocking. Truthfully I think this is probably his support hose. However this does not maintain skin integrity will be limited. She cannot put over the toe compression stockings on because of neck problems hand problems etc. She is allergic to the lining layer of juxta lites. We might be forced to use extremitease stocking should this fail READMIT 11/24/2018 Patient is now a 76 year old woman who is not a diabetic. She has been in this clinic on at least 3 previous occasions largely with recurrent wounds on her left leg secondary to chronic venous insufficiency with secondary lymphedema. Her situation is complicated by inability to get stockings on and an allergy to neoprene which is apparently a component and at least juxta lites and other stockings. As a result she really has not been wearing any stockings on her legs. She tells [place] that roughly 2 or 3 weeks ago she started noticing a stinging sensation just above her ankle on the left medial aspect. She has been diagnosed with pseudogout and she wondered whether this was what she was  experiencing. She tried to dress this with something she bought at the store however subsequently it pulled skin off and now she has an open wound that is not improving. She has been using Vaseline gauze with a cover bandage. She saw her primary doctor last week who put an Haematologist on her. ABIs in this clinic was 1.03 on the left 2/12; the area is on the left medial ankle. Odd-looking wound with what looks to be surface epithelialization but a multitude of small petechial openings. This clearly not closed yet. We have been using silver alginate under 3 layer compression with TCA 2/19; the wound area did not look quite as good this week. Necrotic debris over the majority of the wound surface which required debridement. She continues to have a multitude of what looked to be small petechial openings. She reminds [place] that she had a biopsy on this initially during her first outbreak in 2015 in [place] dermatology. She expresses concern about this being a possible melanoma. 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  I assured her that this does not look anything like melanoma. Except for the petechial reaction it does look like a venous insufficiency area and she certainly has evidence of this on both sides 2/26; a difficult area on the left medial ankle. The patient clearly has chronic venous hypertension with some degree of lymphedema. The odd thing about the area is the small petechial hemorrhages. I am not really sure how to explain this. This was present last time and this is not a compression injury. We have been using Hydrofera Blue which I changed to last week 3/4; still using Hydrofera Blue. Aggressive debridement today. She does not have known arterial issues. She has seen Dr. Kellie Simmering at [facility] and and has an ablation on the left. [Anterior accessory branch of the greater saphenous].  From what I remember they did not feel she had an arterial issue. The patient has had this area biopsied in 2009 at [facility] dermatology and by her recollection they said this was "stasis". She is also follow-up with dermatology locally who thought that this was more of a vascular issue 3/11; using Hydrofera Blue. Aggressive debridement today. She does not have an arterial issue. We are using 3 layer compression although we may need to go to 4. The patient has been in for multiple changes to her wrap since I last saw her a week ago. She says that the area was leaking. I do not have too much more information on what was found 01/19/19 on evaluation today patient was actually being seen for a nurse visit when unfortunately she had the area on her left lateral lower extremity as well as weeping from the right lower extremity that became apparent. Therefore we did end up actually seeing her for a full visit with myself. She is having some pain at this site as well but fortunately nothing too significant at this point. No fevers, chills, nausea, or vomiting noted at this time. 3/18-Patient is back to the clinic with the left leg venous leg ulcer, the ulcer is larger in size, has a surface that is densely adherent with fibrinous tissue, the Hydrofera Blue was used but is densely adherent and there was difficulty in removing it. The right lower extremity was also wrapped for weeping edema. Patient has a new area over the left lateral foot above the malleolus that is small and appears to have no debris with intact surrounding skin. Patient is on increased dose of Lasix also as a means to edema management 3/25; the patient has a nonhealing venous ulcer on the medial left leg and last week developed a smaller area on the lateral left calf. We have been using Hydrofera Blue with a contact layer. 4/1; no major change in these wounds areas. Left medial and more recently left lateral calf. I tried Iodoflex last  week to aid in debridement she did not tolerate this. She stated her pain was terrible all week. She took the top layer of the 4 layer compression off. 4/8; the patient actually looks somewhat better in terms of her more prominent left lateral calf wound. There is some healthy looking tissue here. She is still complaining of a lot of discomfort. 4/15; patient in a lot of pain secondary to sciatica. She is on a prednisone taper prescribed by her primary physician. She has the 2 areas one on the left medial and more recently a smaller area on the left lateral calf. Both of these just above the malleoli 4/22; her  back pain is better but she still states she is very uncomfortable and now feels she is intolerant to the [employer]. No real change in the wounds we have been using Sorbact. She has been previously intolerant to Iodoflex. There is not a lot of option about what we can use to debride this wound under compression that she no doubt needs. sHe states Ultram no longer works for her pain 4/29; no major change in the wounds slightly increased depth. Surface on the original medial wound perhaps somewhat improved however the more recent area on the lateral left ankle is 100% covered in very adherent debris we have been using Sorbact. She tolerates 4 layer compression well and her edema control is a lot better. She has not had to come in for a nurse check 5/6; no major change in the condition of the wounds. She did consent to debridement today which was done with some difficulty. Continuing Sorbact. She did not tolerate Iodoflex. She was in for a check of her compression the day after we wrapped her last week this was adjusted but nothing much was found 5/13; no major change in the condition or area of the wounds. I was able to get a fairly aggressive debridement done on the lateral left leg [place] ([id number]) wound. Even using Sorbact under compression. She came back in on Friday to have the  wrap changed. She says she felt uncomfortable on the lateral aspect of her ankle. She has a long history of chronic venous insufficiency including previous ablation surgery on this side. 5/20-Patient returns for wounds on left leg with both wounds covered in slough, with the lateral leg wound larger in size, she has been in 3 layer compression and felt more comfortable, she describes pain in ankle, in leg and pins and needles in foot, and is about to try Pamelor for this 6/3; wounds on the left lateral and left medial leg. The area medially which is the most recent of the 2 seems to have had the largest increase in dimensions. We have been using Sorbac to try and debride the surface. She has been to see orthopedics they apparently did a plain x-ray that was indeterminant. Diagnosed her with neuropathy and they have ordered an MRI to determine if there is underlying osteomyelitis. This was not high on my thought list but I suppose it is prudent. We have advised her to make an appointment with vein and vascular in [place]. She has a history of a left greater saphenous and accessory vein ablations I wonder if there is anything else that can be done from a surgical point of view to help in these difficult refractory wounds. We have previously healed this wound on one occasion but it keeps on reopening [medial side] 6/10; deep tissue culture I did last week I think on the left medial wound showed both moderate E. coli and moderate staph aureus [MSSA]. She is going to require antibiotics and I have chosen Augmentin. We have been using Sorbact and we have made better looking wound surface on both sides but certainly no improvement in wound area. She was back in last Friday apparently for a dressing changes the wrap was hurting her outer left ankle. She has not managed to get a hold of vein and vascular in [place]. We are going to have to make her that appointment 6/17; patient is tolerating the  Augmentin. She had an MRI that I think was ordered by orthopedic surgeon this did not  show osteomyelitis or an abscess did suggest cellulitis. We have been using Sorbact to the lateral and medial ankles. We have been trying to arrange a follow-up appointment with vein and vascular in [place] or did her original ablations. We apparently an area sent the request to vein and vascular in [facility] 6/24; patient has completed the Augmentin. We do not yet have a vein and vascular appointment in [place]. I am not sure what the issue is here we have asked her to call tomorrow. We are using Sorbact. Making some improvements and especially the medial wound. Both surfaces however look better medial and lateral. 7/1; the patient has been in contact with vein and vascular in [place] but has not yet received an appointment. Using Sorbact we have gradually improve the wound surface with no improvement in surface area. She is approved for Apligraf but the wound surface still is not completely viable. She has not had to come in for a dressing change 7/8; the patient has an appointment with vein and vascular on 7/31 which is a Friday afternoon. She is concerned about getting back here for [place] to dress her wounds. I think it is important to have them goal for her venous reflux/history of ablations etc. to see if anything else can be done. She apparently tested positive for 1 of the blood tests with regards to lupus and saw a rheumatologist. He has raised the issue of vasculitis again. I have had this thought in the past however the evidence seems overwhelming that this is a venous reflux etiology. If the rheumatologist tells me there is clinical and laboratory investigation is positive for lupus I will rethink this. 7/15; the patient's wound surfaces are quite a bit better. The medial area which was her original wound now has no depth although the lateral wound which was the more recent area actually appears  larger. Both with viable surfaces which is indeed better. Using Sorbact. I wanted to use Apligraf on her however there is the issue of the vein and vascular appointment on 7/31 at 2:00 in the afternoon which would not allow her to get back to be rewrapped and they would no doubt remove the graft 7/22; the patient's wound surfaces have moderate amount of debris although generally look better. The lateral one is larger with 2 small satellite areas superiorly. We are waiting for her vein and vascular appointment on 7/31. She has been approved for Apligraf which I would like to use after th 7/29; wound surfaces have improved no debridement is required we have been using Sorbact. She sees vein and vascular on Friday with this so question of whether anything can be done to lessen the likelihood of recurrence and/or speed the healing of these areas. She is already had previous ablations. She no doubt has severe venous hypertension 8/5-Patient returns at 1 week, she was in [place] for 3 days by her podiatrist, we have been using so backed to the wound, she has increased pain in both the wounds on the left lower leg especially the more distal one on the lateral aspect 8/12-Patient returns at 1 week and she is agreeable to having debridement in both wounds on her left leg today. We have been using Sorbact, and vascular studies were reviewed at last visit 8/19; the patient arrives with her wounds fairly clean and no debridement is required. We have used Sorbact which is really done a nice job in cleaning up these very difficult wound surfaces. The patient saw Dr. Donzetta Matters  of vascular surgery on 7/31. He did not feel that there was an arterial component. He felt that her treated greater saphenous vein is adequately addressed and that the small saphenous vein did not appear to be involved significantly. She was also noted to have deep venous reflux which is not treatable. Dr. Donzetta Matters mentioned the possibility of a  central obstructive component leading to reflux and he offered her central venography. She wanted to discuss this or think about it. I have urged her to go ahead with this. She has had recurrent difficult wounds in these areas which do heal but after months in the clinic. If there is anything that can be done to reduce the likelihood of this I think it is worth it. 9/2 she is still working towards getting follow-up with Dr. Donzetta Matters to schedule her CT. Things are quite a bit worse venography. I put Apligraf on 2 weeks ago on both wounds on the medial and lateral part of her left lower leg. She arrives in clinic today with 3 superficial additional wounds above the area laterally and one below the wound medially. She describes a lot of discomfort. I think these are probably wrapped injuries. Does not look like she has cellulitis. 07/20/2019 on evaluation today patient appears to be doing somewhat poorly in regard to her lower extremity ulcers. She in fact showed signs of erythema in fact we may even be dealing with an infection at this time. Unfortunately I am unsure if this is just infection or if indeed there may be some allergic reaction that occurred as a result of the Apligraf application. With that being said that would be unusual but nonetheless not impossible in this patient is one who is unfortunately allergic to quite a bit. Currently we have been using the Sorbact which seems to do as well as anything for her. I do think we may want to obtain a culture today to see if there is anything showing up there that may need to be addressed. 9/16; noted that last week the wounds look worse in 1 week follow-up of the Apligraf. Using Sorbact as of 2 days ago. She arrives with copious amounts of drainage and new skin breakdown on the back of the left calf. The wounds arm more substantial bilaterally. There is a fair amount of swelling in the left calf no overt DVT there is edema present I think in the left  greater than right thigh. She is supposed to go on 9/28 for CT venography. The wounds on the medial and lateral calf are worse and she has new skin breakdown posteriorly at least new for me. This is almost developing into a circumferential wound area The Apligraf was taken off last week which I agree with things are not going in the right direction a culture was done we do not have that back yet. She is on Augmentin that she started 2 days ago 9/23; dressing was changed by her nurses on Monday. In general there is no improvement in the wound areas although the area looks less angry than last week. She did get Augmentin for MSSA cultured on the 14th. She still appears to have too much swelling in the left leg even with 3 layer compression 9/30; the patient underwent her procedure on 9/28 by Dr. Donzetta Matters at vascular and vein specialist. She was discovered to have the common iliac vein measuring 12.2 mm but at the level of L4-L5 measured 3 mm. After stenting it measured 10 mm. 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 Rouleaux flow in the common femoral and femoral vein was observed much improved after stenting. We are using silver alginate to the wounds on the medial and lateral ankle on the left. 4 layer compression 10/7; the patient had fluid swelling around her knee and 4 layer compression. At the advice of vein and vascular this was reduced to 3 layer which she is tolerating better. We have been using silver alginate under 3 layer compression since last Friday ASTER, SCREWS ([id number]) 10/14; arrives with the areas on the left ankle looking a lot better. Inflammation in the area also a lot better. She came in for a nurse check on 10/9 10/21; continued nice improvement. Slight improvements in surface area of both the medial and lateral wounds on the left. A lot of the satellite lesions in the weeping erythema around these from stasis dermatitis is resolved. We have been using  silver alginate 10/28; general improvement in the entire wound areas although not a lot of change in dimensions the wound certainly looks better. There is a lot less in terms of venous inflammation. Continue silver alginate this week however look towards Hydrofera Blue next week 11/4; very adherent debris on the medial wound left wound is not as bad. We have been using silver alginate. Change to [facility] today 11/11; very adherent debris on both wound areas. She went to vein and vascular last week and follow-up they put in [place] boot on this today. He says the [facility] was adherent. Wound is definitely not as good as last week. Especially on the left there the satellite lesions look more prominent 11/18; absolutely no better. erythema on lateral aspect with tenderness. 09/30/2019 on evaluation today patient appears to actually be doing better. Dr. Dellia Nims did put her on doxycycline last week which I do believe has helped her at this point. Fortunately there is no signs of active infection at this time. No fevers, chills, nausea, vomiting, or diarrhea. I do believe he may want extend the doxycycline for 7 additional days just to ensure everything does completely cleared up the patient is in agreement with that plan. Otherwise she is going require some sharp debridement today 12/2; patient is completing a 2-week course of doxycycline. I gave her this empirically for inflammation as well as infection when I last saw her 2 weeks ago. All of this seems to be better. She is using silver alginate she has the area on the medial aspect of the larger area laterally and the 2 small satellite regions laterally above the major wound. 12/9; the patient's wound on the left medial and left lateral calf look really quite good. We have been using silver alginate. She saw vein and vascular in follow-up on 10/09/2019. She has had a previous left greater saphenous vein ablation by Dr. Oscar La in 2016. More  recently she underwent a left common iliac vein stent by Dr. 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 The swelling is improved and certainly the wounds have improved. The patient shows [place] today area on the right medial calf there is almost no wound but leaking lymphedema. She says she start this started 3 or 4 days ago. She did not traumatize it. It is not painful. She does not wear compression on that side 12/16; the patient continues to do well laterally. Medially still requiring debridement. The area on the right calf did not materialize to anything and is not currently open. We wrapped  this last time. She has support stockings for that leg although I am not sure they are going to provide adequate compression 12/23; the lateral wound looks stable. Medially still requiring debridement for tightly adherent fibrinous debris. We've been using silver alginate. Surface area not any different 12/30; neither wound is any better with regards to surface and the area on the left lateral is larger. I been using silver alginate to the left lateral which look quite good last week and Sorbact to the left medial 11/11/2019. Lateral wound area actually looks better and somewhat smaller. Medial still requires a very aggressive debridement today. We have been using Sorbact on both wound areas 1/13; not much better still adherent debris bilaterally. I been using Sorbact. She has severe venous hypertension. Probably some degree of dermal fibrosis distally. I wonder whether tighter compression might help and I am going to try that today. We also need to work on the bioburden 1/20; using Sorbact. She has severe venous hypertension status post stent placement for pelvic vein compression. We applied gentamicin last time to see if we could reduce bioburden I had some discussion with her today about the use of pentoxifylline. This is occasionally used in this setting for wounds with refractory  venous insufficiency. However this interacts with Plavix. She tells me that she was put on this after stent placement for 3 months. She will call Dr. Claretha Cooper office to discuss 1/27; we are using gentamicin under Sorbact. 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 She has a stent. Wound medially is measuring smaller this week. Laterally measuring slightly larger although she has some satellite lesions superiorly 2/3; gentamicin under Sorbact under 4-layer compression. 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 She has a stent on Plavix. Her wounds are measuring smaller this week. More substantially laterally where there is a satellite lesion superiorly. 2/10; gentamicin under Sorbac. 4-layer compression. Patient communicated with Dr. Donzetta Matters at vein and vascular in [place]. He is okay with the patient coming off Plavix I will therefore start her on pentoxifylline for a 1 month trial. In general her wounds look better today. I had some concerns about swelling in the left thigh however she measures 61.5 on the right and 63 on the mid thigh which does not suggest there is any difficulty. The patient is not describing any pain. 2/17; gentamicin under Sorbac 4-layer compression. She has been on pentoxifylline for 1 week and complains of loose stool. No nausea she is eating and drinking well 2/24; the patient apparently came in 2 days ago for a nurse visit when her wrap fell down. Both areas look a little worse this week macerated medially and satellite lesions laterally. Change to silver alginate today 3/3; wounds are larger today especially medially. She also has more swelling in her foot lower leg and I even noted some swelling in her posterior thigh which is tender. I wonder about the patency of her stent. Fortuitously she sees Dr. Claretha Cooper group on Friday 3/10; Mrs. Guercio was seen by vein and vascular on 3/5. The patient underwent ultrasound. There was  no evidence of thrombosis involving the IVC no evidence of thrombosis involving the right common iliac vein there is no evidence of thrombosis involving the right external iliac vein the left external vein is also patent. The right common iliac vein stent appears patent bilateral common femoral veins are compressible and appear patent. I was concerned about the left common iliac stent however it looks like this is functional.  She has some edema in the posterior thigh that was tender she still has that this week. I also note they had trouble finding the pulses in her left foot and booked her for an ABI baseline in 4 weeks. She will follow up in 6 months for repeat IVC duplex. The patient stopped the pentoxifylline because of diarrhea. It does not look like that was being effective in any case. I have advised her to go back on her aspirin 81 mg tablet, vascular it also suggested this 3/17; comes in today with her wound surfaces a lot better. The excoriations from last week considerably better probably secondary to the TCA. We have been using silver alginate 3/24; comes in today with smaller wounds both medially and laterally. Both required debridement. There are 2 small satellite areas superiorly laterally. She also has a very odd bandlike area in the mid calf almost looking like there was a weakness in the wrap in a localized area. I would write this off as being this however anteriorly she has a small raised ballotable area that is very tender almost reminiscent of an abscess but there was no obvious purulent surface to it. YURI, FLENER ([id number]) 02/04/20 upon evaluation today patient appears to be doing fairly well in regard to her wounds today. Fortunately there is no signs of active infection at this time. No fevers, chills, nausea, vomiting, or diarrhea. She has been tolerating the dressing changes without complication. Fortunately I feel like she is showing signs of improvement although  has been sometime since have seen her. Nonetheless the area of concern that Dr. Dellia Nims had last week where she had possibly an area of the wrap that was we can allow the leg to bulge appears to be doing significantly better today there is no signs of anything worsening. 4/7; the patient's wounds on her medial and lateral left leg continue to contract. We have been using a regular alginate. Last week she developed an area on the right medial lower leg which is probably a venous ulcer as well. 4/14; the wounds on her left medial and lateral lower leg continue to contract. Surface eschar. We have been using regular alginate. The area on the right medial lower leg is closed. We have been putting both legs under 4-layer contraction. The patient went back to see vein and vascular she had arterial studies done which were apparently "quite good" per the patient although I have not read their notes I have never felt she had an arterial issue. 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 This is been longstanding and refractory to exercise, leg elevation and longstanding use of compression wraps in our clinic as well as compression stockings on the times we have been able to get these to heal 4/21; we thought she actually might be close this week however she arrives in clinic with a lot of edema in her upper left calf and into her posterior thigh. This is been an intermittent problem here. She says the wrap fell down but it was replaced with a nurse visit on Monday. We are using calcium alginate to the wounds and the wound sizes there not terribly larger than last week but there is a lot more edema 4/28; again wound edges are smaller on both sides. Her edema is better controlled than last time. She is obtained her compression pumps from [employer] although they have not been to her home to set these up. 5/5; left medial and left  lateral both look stable. I am  not sure the medial is any smaller. We have been using calcium alginate under 4-layer compression. She had an area on the right medial. This was eschared today. We have been wrapping this as well. She does not tolerate external compression stockings due to a history of various contact allergies. She has her compression pumps however the representative from the company is coming on her to show her how to use these tomorrow 5/19; patient with severe chronic venous insufficiency secondary to central venous disease. She had a stent placed in her left common iliac vein. She has done better since but still difficult to control wounds. She comes in today with nothing open on the right leg. Her areas on the left medial and left lateral are just about closed. We are using calcium alginate under 4-layer compression. She is using her external compression pumps at home She only has 15-20 support stockings. States she cannot get anything tighter than that on. 03/30/20-Patient returns at 1 week, the wounds on the left leg are both slightly bigger, the last week she was on 3 layer compression which started to slide down. She is starting to use her lymphedema pumps although she stated on 1 day her right ankle started to swell up and she have to stop that day. Unfortunately the open area seem to oscillate between improving to the point of healing and then flaring up all to do with effectiveness of compression or lack of due to the left leg topography not keeping the compression wraps from rolling down 6/2 patient comes in with a 15/20 mmHg stocking on the right leg. She tells me that she developed a lot of swelling in her ankles she saw orthopedics she was felt to possibly be having a flare of pseudogout versus some other type of arthritis. She was put on steroids for a respiratory issue so that helps with the inflammation. She has not been using the pumps all week. She thinks the left thigh is more swollen than  usual and I would agree with that. She has an appointment with Dr. Donzetta Matters 9 days or so from now 6/9; both wounds on the left medial and left lateral are smaller. We have been using calcium alginate under compression. She does not have an open wound on the right leg she is using a stocking and her compression pumps things are going well. She has an appointment with Dr. Donzetta Matters with regards to her stent in the left common iliac vein 6/16; the wounds on the left medial and left lateral ankle continues to contract. The patient saw Dr. Donzetta Matters and I think he seems satisfied. Ordered follow-up venous reflux studies on both sides in September. Cautioned that she may need thigh-high stockings. She has been using calcium alginate under compression on the left and her own stocking on the right leg. She tells [place] there are no open wounds on the right 6/23; left lateral is just about closed. Medial required debridement today. We have been using calcium alginate. Extensive discussion about the compression pumps she is only using these on 25 mmHg states she could not take 40 or 30 when the wrap came out to her home to demonstrate these. He said they should not feel tight 6/30; the left lateral wound has a slight amount of eschar. . The area medially is about the same using Hydrofera Blue. 7/7; left lateral wound still has some eschar. I will remove this next week may be closed. The  area medially is very small using Hydrofera Blue with improvement. Unfortunately the stockings fell down. Unfortunately the blisters have developed at the edge of where the wrap fell. When this happened she says her legs hurt she did not use her pumps. We are not open Monday for her to come in and change the wraps and she had an appointment yesterday. She also tells me that she is going to have an MRI of her back. She is having pain radiating into her left anterior leg she thinks her from an L5 disc. She saw Dr. Ellene Route of neurosurgery 7/14;  the area on the left lateral ankle area is closed. Still a small area medially however it looks better as well. We have been using Hydrofera Blue under 4-layer compression 7/21; left lateral ankle is still closed however her wound on the medial left calf is actually larger. This is probably because Hydrofera Blue got stuck to the wound. She came in for a nurse change on Friday and will do that again this week I was concerned about the amount of swelling that she had last week however she is using her compression pumps twice a day and the swelling seems well controlled 7/28; remaining wound on the left medial lower leg is smaller. We have been using moistened silver collagen under compression she is coming back for a nurse visit. For reasons that were not really clear she was just keeping her legs elevated and not using her compression pumps. I have asked her to use the compression pumps. She does not have any wounds on the right leg 06/15/20-Patient returns at 2 weeks, her LLE edema is worse and she developed a blister wound that is new and has bigger posterior calf wound on right, we are using Prisma with pad, 4 layer compression. she has been on lasix 40 mg daily 8/18; patient arrives today with things a lot worse than I remember from a few weeks ago. She was seen last week. Noted that her edema was worse and that she now had a left lateral wound as well as deteriorating edema in the medial and posterior part of the lower leg. She says she is using his or her external compression pumps once a day although I wonder about the compliance. 8/25; weeping area on the right medial lower leg. This had actually gotten a small localized area of her compression stocking wet. On the left side there is a large denuded area on the posterior medial lower leg and smaller area on the lateral. This was not the original areas that we dealt with. 9/1 the patient's wound on the left leg include the left lateral and left  posterior. Larger superficial wounds weeping. She has very poor edema control. Tender localized edema in the left lower medial ankle/heel probably because of localized wrap issues. She freely admits she is not using the compression pumps. She has been up on her feet a lot. She thinks the hydrofera blue is contributing to the pain she is experiencing.. This is a complaint that I have occasionally heard ADELIZ, TONKINSON ([id number]) 9/8; really not much improvement. The patient is still complaining of a lot of pain particularly when she uses compression pumps. I switched her to silver alginate last time because she found the Hydrofera Blue to be irritating. I don't hear much difference in her description with the silver alginate. 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 She has follow-up with Dr. Donzetta Matters I think for ultrasound next month 9/15; quite a bit of improvement today. We have less edema and more epithelialization in both of her wound areas on the left medial and left lateral calf. These are not the site of her original wounds in this area. She says she has been using her compression pumps for 30 minutes twice a day, there pain issues that never quite understood. Silver alginate as the primary dressing 9/22; continued improvement. Both areas medially and laterally still have a small open area there is some eschar. She continues to complain of left medial ankle pain. Swelling in the leg is in much better condition. We have been using silver alginate 9/29; continued improvement. Both areas medially and laterally in the left calf look as though they are close some minor surface eschar but I think this is epithelialized. She comes in today saying she has a ruptured disc at L4-L5 cannot bend over to put on her stockings. 10/6; patient comes in today with no open wounds on either leg. However her edema on the left leg in the  upper one third of the lower leg is poorly controlled nonpitting. She says that she could not use the pumps for 2 days and then she has been using the last couple of days. It is not clear to me she has been able to get her stocking on. She has back problems. Mrs. Erisman has severe chronic venous insufficiency with secondary lymphedema. Her venous insufficiency is partially centrally mediated and that she is now post stent in the left common iliac vein. [facility] Thurner's syndrome/physiology]. She follows up with them on 10/15. She wears 20/30 below-knee stockings. She is supposed to use compression pumps at home although I think her compliance about with this is been less than 100%. I have asked her to use these 3 times a day. Finally I think she has lipodermatosclerosis in the left lower leg with an inverted bottle sign. It is been a major problem controlling the edema in the left leg. The right leg we have had wounds on but not as significant a problem is on the left READMISSION 04/12/2021 Mrs. Mcgonagle is a 76 year old woman we know well in this clinic. She has severe chronic venous insufficiency. 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 I believe she has had bilateral greater saphenous vein ablation in the past as well. She tells me that this wound opened sometime in March. She had a fall and thinks it was initially abrasion. She developed areas she describes as little blisters on the anterior part of her leg and she saw dermatology and was treated for methicillin staph aureus with several rounds of antibiotics. She has been using support stockings on the left leg and says this is the only thing she can get on. Her compression pump use maybe once a day she says if she did not use one she use the other. She comes in today with incredible swelling in the left leg with a wound on the left posterior calf. She has been using Neosporin to this previously a  hydrocolloid. 6/15; patient arrives back for 1 week follow-up.Marland Kitchen Apparently her wrap fell down she did not call us to replace this. He has poor edema control. She only uses her compression pumps once a day 6/29; patient presents for 1 week follow-up. She has tolerated the compression wrap well. We have been using Iodoflex under  the wrap. She has no issues or complaints today. 7/6; patient presents for 1 week follow-up. She states that the compression wrap rolled down her leg 4 days ago. She has been trying to keep the area covered but has no dressings at home to use. She denies signs of infection. 7/13; patient presents for 1 week follow-up. She states that her compression wrap rolled down her right leg and she called our office and had it placed a few days ago. She has been tolerating the current wrap well. She states that the Iodoflex is causing a burning sensation. She denies signs of infection. 7/27; patient presents for 1 week follow-up She has tolerated the compression wrap well with Hydrofera Blue underneath. She has now developed a wound to her right lower extremity. She reports having a culture done To this area by her dermatologist that she reports is negative. She currently denies signs of infection. 8/30; patient presents for 1 week follow-up. On the left side she has tolerated the compression wrap well with Hydrofera Blue underneath. She reports some discomfort to her right lower extremity with the 3 layer compression. She currently denies signs of infection. 06/14/2021 upon evaluation today patient's wound actually appears to be doing decently well based on what I am seeing currently. She actually has 2 areas 1 on the left distal/posterior lower leg and the other on the right lower leg. Subsequently the measurements are roughly the same may be slightly smaller but in general have not made any trend towards getting worse which is great news. 06/23/2021 upon evaluation today patient  appears to be doing pretty well in regard to her wounds. On the right she is having difficulty with sciatic pain and this subsequently has led to her taking the wrap off over the last week her leg is more swollen she is also been on prednisone for 10 days. With that being said I think we may want to just use a compression sock on the left that way she will not have to worry about the compression wrap being in place that she cannot get off. With that being said I do believe as well that the patient is going to need to continue with the wrapping on the left we will also need to do a little bit of sharp debridement today. 8/24; patient presents for 1 week follow-up. She has no issues or complaints today. She denies signs of infection. 8/31; patient presents for 1 week follow-up. She has no issues or complaints today. She has tolerated the compression wrap well on the left lower extremity. She denies signs of infection. 9/7; patient presents for follow-up. She reports that the compression wrap rolled down slightly on her right lower extremity. She reports tenderness to the wound bed. She denies increased warmth or erythema to the surrounding skin. She overall feels well. 9/14; patient presents for follow-up. She has no issues or complaints today. She denies signs of infection. PuraPly is available and patient would like to start this today. 9/21; patient presents for follow-up. She has no issues or complaints today. She tolerated the first skin substitute placement last week under compression. 9/28; patient presents for 1 week follow-up. She has no issues or complaints today. 10/5; patient presents for 1 week follow-up. She reports taking the wrap off yesterday. She was on her feet more yesterday and developed more swelling to her left lower extremity. She denies pain. Overall she is doing well. [name], [name] ([id number]) 10/12; patient presents for 1 week follow-up.  She has no issues or complaints  today. 10/19; patient presents for follow-up. She has no issues or complaints today. She tolerated the compression wrap well. She states she has compression stockings at home. Readmission: 11/27/2021 upon evaluation today patient appears to be doing poorly in regard to the wounds on her right leg. She also has an area of erythema in the proximal calf area of the right leg which again I do believe is a issue from the standpoint of it being warm and erythematous to touch and also seems to be somewhat localized I really think this may be more of a cellulitis issue than anything else. Fortunately I do not see any signs of active infection Systemically at this time. 12/05/2021 upon evaluation patient's wounds actually appear to be showing some signs of improvement which is not too bad and just 1 week's time. Especially since we had to get on the right antibiotic which is only been for the past 4 days. Fortunately I do not see any signs of active infection locally nor systemically at this time that has gotten any worse. Overall I think that again with the antibiotic she is significantly improved and currently she is taking Levaquin. 12/11/2021 upon evaluation today patient appears to be doing well with regard to her leg. Between the antibiotics and the triamcinolone I feel like she is doing significantly better which is great news. Overall I am extremely pleased with where we stand. There does not appear to be any signs of active infection locally or systemically at this time. Objective Constitutional [name] and well-hydrated in no acute distress. Vitals Time Taken: 2:16 PM, Height: 62 in, Weight: 199 lbs, BMI: 36.4, Temperature: 98 F, Pulse: 98 bpm, Respiratory Rate: 18 breaths/min, Blood Pressure: 154/83 mmHg. Respiratory normal breathing without difficulty. Psychiatric this patient is able to make decisions and demonstrates good insight into disease process. Alert and Oriented x 3. pleasant  and cooperative. General Notes: Upon inspection patient's wound bed actually showed signs of good granulation and epithelization at this point. There was a lot of dry and flaky skin that was coming off although not all of it wanted to come off super easily. Underneath there appeared to be good healed skin over the majority of the ankle region. Subsequently I do believe that we are very close to getting this resolved actually and even from last week to this week this is a tremendous improvement. Integumentary (Hair, Skin) Wound #15 status is Open. Original cause of wound was Gradually Appeared. The date acquired was: 10/27/2021. The wound has been in treatment 2 weeks. The wound is located on the Right,Circumferential Lower Leg. The wound measures 7cm length x 14cm width x 0.1cm depth; 76.969cm^2 area and 7.697cm^3 volume. There is Fat Layer (Subcutaneous Tissue) exposed. There is no tunneling or undermining noted. There is a large amount of serosanguineous drainage noted. The wound margin is indistinct and nonvisible. There is medium (34-66%) pink, pale granulation within the wound bed. There is a medium (34-66%) amount of necrotic tissue within the wound bed including Adherent Slough. Assessment Active Problems ICD-10 Lymphedema, not elsewhere classified Chronic venous hypertension (idiopathic) with ulcer and inflammation of right lower extremity Non-pressure chronic ulcer of other part of right lower leg limited to breakdown of skin LESLEYANNE, POLITTE. ([id number]) Procedures Wound #15 Pre-procedure diagnosis of Wound #15 is a Lymphedema located on the Right,Circumferential Lower Leg . There was a Three Layer Compression Therapy Procedure by Levora Dredge, RN. Post procedure Diagnosis Wound #15: Same as  Pre-Procedure Plan Follow-up Appointments: Return Appointment in 1 week. Nurse Visit as needed Bathing/ Shower/ Hygiene: May shower with wound dressing protected with water repellent cover  or cast protector. WOUND #15: - Lower Leg Wound Laterality: Right, Circumferential Cleanser: Soap and Water Discharge Instructions: Gently cleanse wound with antibacterial soap, rinse and pat dry prior to dressing wounds Topical: Triamcinolone Acetonide Cream, 0.1%, 15 (g) tube Discharge Instructions: Apply as directed by provider. apply to wound and leg Primary Dressing: Xtrasorb Classic Super Absorbent Dressing, 6x9 (in/in) Discharge Instructions: over open areas of the leg that are weeping Compression Wrap: Unna-Z Zinc and Calamine Boot, 4x10 (in/yd) Discharge Instructions: Use to anchor wrap in place. Apply one strip around leg 3-finger widths below the knee to secure the wrap. Compression Wrap: Profore Lite LF 3 Multilayer Compression Bandaging System Discharge Instructions: Apply 3 multi-layer wrap as prescribed. 1. Would recommend at this point that we going to continue with the wound care measures as before and the patient is in agreement with the plan. This includes the use of the triamcinolone which I think has been beneficial. 2. We will get a continue with the 3 layer compression wrap and when using XtraSorb to cover the area which will keep this from sticking as well. 3. I am also can recommend the patient should continue to elevate her legs much as possible to help with edema control. We will see patient back for reevaluation in 1 week here in the clinic. If anything worsens or changes patient will contact our office for additional recommendations. Electronic Signature(s) Signed: 12/11/2021 3:35:03 PM By: Worthy Keeler PA-C Entered By: Worthy Keeler on 12/11/2021 15:35:02 Crossen, [name] ([id number]) -------------------------------------------------------------------------------- SuperBill Details Patient Name: ZAHRA, PEFFLEY. Date of Service: 12/11/2021 Medical Record Number: [id number] Patient Account Number: [id number] Date of Birth/Sex: 30-May-1946 (76 y.o. F) Treating RN:  Levora Dredge Primary Care Provider: [name] Other Clinician: Referring Provider: [name] Treating Provider/Extender: [name] in Treatment: 2 Diagnosis Coding ICD-10 Codes Code Description I89.0 Lymphedema, not elsewhere classified I87.331 Chronic venous hypertension (idiopathic) with ulcer and inflammation of right lower extremity L97.811 Non-pressure chronic ulcer of other part of right lower leg limited to breakdown of skin Facility Procedures CPT4 Code: [id number] Description: (Facility Use Only) [phone] - [facility] L[facility] RT LEG Modifier: Quantity: 1 Physician Procedures CPT4 Code Description: [id number] - WC PHYS LEVEL 4 - EST PT Modifier: Quantity: 1 CPT4 Code Description: ICD-10 Diagnosis Description I89.0 Lymphedema, not elsewhere classified I87.331 Chronic venous hypertension (idiopathic) with ulcer and inflammation of rig L97.811 Non-pressure chronic ulcer of other part of right lower leg limited  to brea Modifier: ht lower extremit kdown of skin Quantity: y Engineer, maintenance) Signed: 12/11/2021 3:35:17 PM By: Worthy Keeler PA-C Entered By: Worthy Keeler on 12/11/2021 15:35:17

## 2021-12-11 NOTE — Progress Notes (Addendum)
TRUE, SHACK[place]ORD ([id number]) Visit Report for 12/11/2021 Arrival Information Details Patient Name: [name], [name]. Date of Service: 12/11/2021 2:15 PM Medical Record Number: [id number] Patient Account Number: [id number] Date of Birth/Sex: 1946/02/07 (76 y.o. F) Treating RN: Levora Dredge Primary Care Nai Dasch: [name] Other Clinician: Referring Stoney Karczewski: [name] Treating Kolbee Stallman/Extender: [name] in Treatment: 2 Visit Information History Since Last Visit Added or deleted any medications: No Patient Arrived: Ambulatory Any new allergies or adverse reactions: No Arrival Time: 14:13 Had a fall or experienced change in No Accompanied By: self activities of daily living that may affect Transfer Assistance: None risk of falls: Patient Identification Verified: Yes Hospitalized since last visit: No Secondary Verification Process Completed: Yes Has Dressing in Place as Prescribed: Yes Patient Requires Transmission-Based No Has Compression in Place as Prescribed: Yes Precautions: Pain Present Now: Yes Patient Has Alerts: Yes Patient Alerts: Patient on Blood Thinner 81mg  aspirin Electronic Signature(s) Signed: 12/11/2021 5:12:29 PM By: Levora Dredge Entered By: Levora Dredge on 12/11/2021 14:16:05 Suman, [name] ([id number]) -------------------------------------------------------------------------------- Clinic Level of Care Assessment Details Patient Name: [name], [name]. Date of Service: 12/11/2021 2:15 PM Medical Record Number: [id number] Patient Account Number: [id number] Date of Birth/Sex: Sep 30, 1946 (76 y.o. F) Treating RN: Levora Dredge Primary Care Islam Villescas: [name] Other Clinician: Referring Antron Seth: [name] Treating Kerington Hildebrant/Extender: [name] in Treatment: 2 Clinic Level of Care Assessment Items TOOL 1 Quantity Score []  - Use when EandM and Procedure is performed on INITIAL visit 0 ASSESSMENTS - Nursing  Assessment / Reassessment []  - General Physical Exam (combine w/ comprehensive assessment (listed just below) when performed on new 0 pt. evals) []  - 0 Comprehensive Assessment (HX, ROS, Risk Assessments, Wounds Hx, etc.) ASSESSMENTS - Wound and Skin Assessment / Reassessment []  - Dermatologic / Skin Assessment (not related to wound area) 0 ASSESSMENTS - Ostomy and/or Continence Assessment and Care []  - Incontinence Assessment and Management 0 []  - 0 Ostomy Care Assessment and Management (repouching, etc.) PROCESS - Coordination of Care []  - Simple Patient / Family Education for ongoing care 0 []  - 0 Complex (extensive) Patient / Family Education for ongoing care []  - 0 Staff obtains Programmer, systems, Records, Test Results / Process Orders []  - 0 Staff telephones HHA, Nursing Homes / Clarify orders / etc []  - 0 Routine Transfer to another Facility (non-emergent condition) []  - 0 Routine Hospital Admission (non-emergent condition) []  - 0 New Admissions / Biomedical engineer / Ordering NPWT, Apligraf, etc. []  - 0 Emergency Hospital Admission (emergent condition) PROCESS - Special Needs []  - Pediatric / Minor Patient Management 0 []  - 0 Isolation Patient Management []  - 0 Hearing / Language / Visual special needs []  - 0 Assessment of Community assistance (transportation, D/C planning, etc.) []  - 0 Additional assistance / Altered mentation []  - 0 Support Surface(s) Assessment (bed, cushion, seat, etc.) INTERVENTIONS - Miscellaneous []  - External ear exam 0 []  - 0 Patient Transfer (multiple staff / Civil Service fast streamer / Similar devices) []  - 0 Simple Staple / Suture removal (25 or less) []  - 0 Complex Staple / Suture removal (26 or more) []  - 0 Hypo/Hyperglycemic Management (do not check if billed separately) []  - 0 Ankle / Brachial Index (ABI) - do not check if billed separately Has the patient been seen at the hospital within the last three years: Yes Total Score: 0 Level Of  Care: ____ [name] ([id number]) Electronic Signature(s) Signed: 12/11/2021 5:12:29 PM By: Levora Dredge Entered By: Levora Dredge on 12/11/2021  15:14:57 MYLI, PAE ([id number]) -------------------------------------------------------------------------------- Compression Therapy Details Patient Name: [name], [name]. Date of Service: 12/11/2021 2:15 PM Medical Record Number: [id number] Patient Account Number: [id number] Date of Birth/Sex: Dec 23, 1945 (76 y.o. F) Treating RN: Levora Dredge Primary Care Xitlaly Ault: [name] Other Clinician: Referring Chea Malan: [name] Treating AmeLie Hollars/Extender: Jeri Cos Weeks in Treatment: 2 Compression Therapy Performed for Wound Assessment: Wound #15 Right,Circumferential Lower Leg Performed By: Clinician Levora Dredge, RN Compression Type: Three Layer Post Procedure Diagnosis Same as Pre-procedure Electronic Signature(s) Signed: 12/11/2021 5:12:29 PM By: Levora Dredge Entered By: Levora Dredge on 12/11/2021 15:14:45 Eastman, [name] ([id number]) -------------------------------------------------------------------------------- Encounter Discharge Information Details Patient Name: [name], [name]. Date of Service: 12/11/2021 2:15 PM Medical Record Number: [id number] Patient Account Number: [id number] Date of Birth/Sex: 1946/03/18 (76 y.o. F) Treating RN: Levora Dredge Primary Care Aniel Hubble: [name] Other Clinician: Referring Tamaya Pun: [name] Treating Jamile Sivils/Extender: [name] in Treatment: 2 Encounter Discharge Information Items Discharge Condition: Stable Ambulatory Status: Ambulatory Discharge Destination: Home Transportation: Private Auto Accompanied By: self Schedule Follow-up Appointment: Yes Clinical Summary of Care: Electronic Signature(s) Signed: 12/11/2021 5:12:29 PM By: Levora Dredge Entered By: Levora Dredge on 12/11/2021 15:26:24 LORALAI, EISMAN  ([id number]) -------------------------------------------------------------------------------- Lower Extremity Assessment Details Patient Name: [name], [name]. Date of Service: 12/11/2021 2:15 PM Medical Record Number: [id number] Patient Account Number: [id number] Date of Birth/Sex: [name] 22, 1946 (76 y.o. F) Treating RN: Levora Dredge Primary Care Cortlandt Capuano: [name] Other Clinician: Referring Demetrica Zipp: [name] Treating Antoninette Lerner/Extender: Jeri Cos Weeks in Treatment: 2 Edema Assessment Assessed: [Left: No] [Right: No] Edema: [Left: Ye] [Right: s] Calf Left: Right: Point of Measurement: 30 cm From Medial Instep 39 cm Ankle Left: Right: Point of Measurement: 13 cm From Medial Instep 23 cm Vascular Assessment Pulses: Dorsalis Pedis Palpable: [Right:Yes] Electronic Signature(s) Signed: 12/11/2021 5:12:29 PM By: Levora Dredge Entered By: Levora Dredge on 12/11/2021 14:32:06 Hagans, [name] ([id number]) -------------------------------------------------------------------------------- Multi Wound Chart Details Patient Name: [name], [name]. Date of Service: 12/11/2021 2:15 PM Medical Record Number: [id number] Patient Account Number: [id number] Date of Birth/Sex: Oct 15, 1946 (76 y.o. F) Treating RN: Levora Dredge Primary Care Andrienne Havener: [name] Other Clinician: Referring Dearius Hoffmann: [name] Treating Lyriq Jarchow/Extender: [name] in Treatment: 2 Vital Signs Height(in): 24 Pulse(bpm): 63 Weight(lbs): 199 Blood Pressure(mmHg): 154/83 Body Mass Index(BMI): 36.4 Temperature(F): 98 Respiratory Rate(breaths/min): 18 Photos: [N/A:N/A] Wound Location: Right, Circumferential Lower Leg N/A N/A Wounding Event: Gradually Appeared N/A N/A Primary Etiology: Lymphedema N/A N/A Secondary Etiology: Venous Leg Ulcer N/A N/A Comorbid History: Cataracts, Asthma, Sleep Apnea, N/A N/A Deep Vein Thrombosis, Hypertension, Peripheral Venous Disease,  Osteoarthritis, Received Chemotherapy, Received Radiation Date Acquired: 10/27/2021 N/A N/A Weeks of Treatment: 2 N/A N/A Wound Status: Open N/A N/A Wound Recurrence: No N/A N/A Measurements L x W x D (cm) 7x14x0.1 N/A N/A Area (cm) : 76.969 N/A N/A Volume (cm) : 7.697 N/A N/A % Reduction in Area: 17.60% N/A N/A % Reduction in Volume: 17.60% N/A N/A Classification: Partial Thickness N/A N/A Exudate Amount: Large N/A N/A Exudate Type: Serosanguineous N/A N/A Exudate Color: red, brown N/A N/A Wound Margin: Indistinct, nonvisible N/A N/A Granulation Amount: Medium (34-66%) N/A N/A Granulation Quality: Pink, Pale N/A N/A Necrotic Amount: Medium (34-66%) N/A N/A Exposed Structures: Fat Layer (Subcutaneous Tissue): N/A N/A Yes Fascia: No Tendon: No Muscle: No [name], [name] ([id number]) Joint: No Bone: No Epithelialization: None N/A N/A Treatment Notes Electronic Signature(s) Signed: 12/11/2021 5:12:29 PM By: Levora Dredge Entered By: Levora Dredge on 12/11/2021 14:32:26 Conly, Anagabriela J. ([id number]) --------------------------------------------------------------------------------  Multi-Disciplinary Care Plan Details Patient Name: [name], [name]. Date of Service: 12/11/2021 2:15 PM Medical Record Number: [id number] Patient Account Number: [id number] Date of Birth/Sex: 1945/12/21 (76 y.o. F) Treating RN: Levora Dredge Primary Care Obaloluwa Delatte: [name] Other Clinician: Referring Charlaine Utsey: [name] Treating Lennis Korb/Extender: [name] in Treatment: 2 Active Inactive Necrotic Tissue Nursing Diagnoses: Impaired tissue integrity related to necrotic/devitalized tissue Knowledge deficit related to management of necrotic/devitalized tissue Goals: Necrotic/devitalized tissue will be minimized in the wound bed Date Initiated: 11/27/2021 Target Resolution Date: 12/25/2021 Goal Status: Active Patient/caregiver will verbalize understanding of reason and  process for debridement of necrotic tissue Date Initiated: 11/27/2021 Target Resolution Date: 12/25/2021 Goal Status: Active Interventions: Assess patient pain level pre-, during and post procedure and prior to discharge Provide education on necrotic tissue and debridement process Notes: Orientation to the Wound Care Program Nursing Diagnoses: Knowledge deficit related to the wound healing center program Goals: Patient/caregiver will verbalize understanding of the [place] Date Initiated: 11/27/2021 Target Resolution Date: 12/25/2021 Goal Status: Active Interventions: Provide education on orientation to the wound center Notes: Soft Tissue Infection Nursing Diagnoses: Impaired tissue integrity Knowledge deficit related to disease process and management Knowledge deficit related to home infection control: handwashing, handling of soiled dressings, supply storage Potential for infection: soft tissue Goals: Patient will remain free of wound infection Date Initiated: 11/27/2021 Target Resolution Date: 12/25/2021 Goal Status: Active Patient/caregiver will verbalize understanding of or measures to prevent infection and contamination in the home setting Date Initiated: 11/27/2021 Target Resolution Date: 12/25/2021 Goal Status: Active Patient's soft tissue infection will resolve Date Initiated: 11/27/2021 Target Resolution Date: 12/25/2021 Goal Status: Active [name], [name] ([id number]) Signs and symptoms of infection will be recognized early to allow for prompt treatment Date Initiated: 11/27/2021 Target Resolution Date: 12/25/2021 Goal Status: Active Interventions: Assess signs and symptoms of infection every visit Provide education on infection Treatment Activities: Culture and sensitivity : 11/27/2021 Education provided on Infection : 12/05/2021 Notes: Venous Leg Ulcer Nursing Diagnoses: Actual venous Insuffiency (use after diagnosis is confirmed) Knowledge  deficit related to disease process and management Potential for venous Insuffiency (use before diagnosis confirmed) Goals: Patient will maintain optimal edema control Date Initiated: 11/27/2021 Target Resolution Date: 12/25/2021 Goal Status: Active Patient/caregiver will verbalize understanding of disease process and disease management Date Initiated: 11/27/2021 Target Resolution Date: 12/25/2021 Goal Status: Active Verify adequate tissue perfusion prior to therapeutic compression application Date Initiated: 11/27/2021 Target Resolution Date: 12/25/2021 Goal Status: Active Interventions: Provide education on venous insufficiency Treatment Activities: Therapeutic compression applied : 11/27/2021 Notes: Wound/Skin Impairment Nursing Diagnoses: Impaired tissue integrity Knowledge deficit related to smoking impact on wound healing Knowledge deficit related to ulceration/compromised skin integrity Goals: Patient/caregiver will verbalize understanding of skin care regimen Date Initiated: 11/27/2021 Target Resolution Date: 12/25/2021 Goal Status: Active Ulcer/skin breakdown will have a volume reduction of 30% by week 4 Date Initiated: 11/27/2021 Target Resolution Date: 12/25/2021 Goal Status: Active Ulcer/skin breakdown will have a volume reduction of 50% by week 8 Date Initiated: 11/27/2021 Target Resolution Date: 01/22/2022 Goal Status: Active Ulcer/skin breakdown will have a volume reduction of 80% by week 12 Date Initiated: 11/27/2021 Target Resolution Date: 02/22/2022 Goal Status: Active Ulcer/skin breakdown will heal within 14 weeks Date Initiated: 11/27/2021 Target Resolution Date: 03/08/2022 Goal Status: Active Interventions: Assess ulceration(s) every visit Provide education on ulcer and skin care [name], [name] ([id number]) Treatment Activities: Skin care regimen initiated : 11/27/2021 Topical wound management initiated : 11/27/2021 Notes: Electronic Signature(s) Signed:  12/11/2021  5:12:29 PM By: Levora Dredge Entered By: Levora Dredge on 12/11/2021 14:32:15 Nery, [name] ([id number]) -------------------------------------------------------------------------------- Pain Assessment Details Patient Name: [name], [name]. Date of Service: 12/11/2021 2:15 PM Medical Record Number: [id number] Patient Account Number: [id number] Date of Birth/Sex: Jul 28, 1946 (76 y.o. F) Treating RN: Levora Dredge Primary Care Blondina Coderre: [name] Other Clinician: Referring Shivon Hackel: [name] Treating Shields Pautz/Extender: [name] in Treatment: 2 Active Problems Location of Pain Severity and Description of Pain Patient Has Paino Yes Site Locations Rate the pain. Current Pain Level: 7 Pain Management and Medication Current Pain Management: Notes pt states to right lower leg Electronic Signature(s) Signed: 12/11/2021 5:12:29 PM By: Levora Dredge Entered By: Levora Dredge on 12/11/2021 14:19:29 Park, [name] ([id number]) -------------------------------------------------------------------------------- Patient/Caregiver Education Details Patient Name: [name], [name]. Date of Service: 12/11/2021 2:15 PM Medical Record Number: [id number] Patient Account Number: [id number] Date of Birth/Gender: 1946-04-15 (76 y.o. F) Treating RN: Levora Dredge Primary Care Physician: [name] Other Clinician: Referring Physician: [name] Treating Physician/Extender: [name] in Treatment: 2 Education Assessment Education Provided To: Patient Education Topics Provided Wound/Skin Impairment: Handouts: Caring for Your Ulcer Methods: Explain/Verbal Responses: State content correctly Electronic Signature(s) Signed: 12/11/2021 5:12:29 PM By: Levora Dredge Entered By: Levora Dredge on 12/11/2021 15:15:26 Serena, [name] ([id number]) -------------------------------------------------------------------------------- Wound Assessment  Details Patient Name: DYNESHIA, BACCAM. Date of Service: 12/11/2021 2:15 PM Medical Record Number: [id number] Patient Account Number: [id number] Date of Birth/Sex: 06-06-1946 (76 y.o. F) Treating RN: Levora Dredge Primary Care Mackayla Mullins: [name] Other Clinician: Referring China Deitrick: [name] Treating Trei Schoch/Extender: Jeri Cos Weeks in Treatment: 2 Wound Status Wound Number: 15 Primary Lymphedema Etiology: Wound Location: Right, Circumferential Lower Leg Secondary Venous Leg Ulcer Wounding Event: Gradually Appeared Etiology: Date Acquired: 10/27/2021 Wound Open Weeks Of Treatment: 2 Status: Clustered Wound: No Comorbid Cataracts, Asthma, Sleep Apnea, Deep Vein Thrombosis, History: Hypertension, Peripheral Venous Disease, Osteoarthritis, Received Chemotherapy, Received Radiation Photos Wound Measurements Length: (cm) 7 % Red Width: (cm) 14 % Red Depth: (cm) 0.1 Epith Area: (cm) 76.969 Tunn Volume: (cm) 7.697 Unde uction in Area: 17.6% uction in Volume: 17.6% elialization: None eling: No rmining: No Wound Description Classification: Partial Thickness Foul Wound Margin: Indistinct, nonvisible Slou Exudate Amount: Large Exudate Type: Serosanguineous Exudate Color: red, brown Odor After Cleansing: No gh/Fibrino Yes Wound Bed Granulation Amount: Medium (34-66%) Exposed Structure Granulation Quality: Pink, Pale Fascia Exposed: No Necrotic Amount: Medium (34-66%) Fat Layer (Subcutaneous Tissue) Exposed: Yes Necrotic Quality: Adherent Slough Tendon Exposed: No Muscle Exposed: No Joint Exposed: No Bone Exposed: No Treatment Notes Wound #15 (Lower Leg) Wound Laterality: Right, Circumferential Cleanser Soap and Water MELISIA, LEMING ([id number]) Discharge Instruction: Gently cleanse wound with antibacterial soap, rinse and pat dry prior to dressing wounds Peri-Wound Care Topical Triamcinolone Acetonide Cream, 0.1%, 15 (g) tube Discharge  Instruction: Apply as directed by Jamani Eley. apply to wound and leg Primary Dressing Xtrasorb Classic Super Absorbent Dressing, 6x9 (in/in) Discharge Instruction: over open areas of the leg that are weeping Secondary Dressing Secured With Compression Wrap Unna-Z Zinc and Calamine Boot, 4x10 (in/yd) Discharge Instruction: Use to anchor wrap in place. Apply one strip around leg 3-finger widths below the knee to secure the wrap. Profore Lite LF 3 Multilayer Compression Bandaging System Discharge Instruction: Apply 3 multi-layer wrap as prescribed. Compression Stockings Add-Ons Electronic Signature(s) Signed: 12/11/2021 5:12:29 PM By: Levora Dredge Entered By: Levora Dredge on 12/11/2021 14:30:55 SEDONA, WENK ([id number]) -------------------------------------------------------------------------------- Vitals Details Patient Name: SANAH, KRASKA. Date of Service: 12/11/2021 2:15  PM Medical Record Number: [id number] Patient Account Number: [id number] Date of Birth/Sex: 14-[name]-1946 (76 y.o. F) Treating RN: Levora Dredge Primary Care Othel Dicostanzo: [name] Other Clinician: Referring Altariq Goodall: [name] Treating Cierah Crader/Extender: [name] in Treatment: 2 Vital Signs Time Taken: 14:16 Temperature (F): 98 Height (in): 62 Pulse (bpm): 98 Weight (lbs): 199 Respiratory Rate (breaths/min): 18 Body Mass Index (BMI): 36.4 Blood Pressure (mmHg): 154/83 Reference Range: 80 - 120 mg / dl Electronic Signature(s) Signed: 12/11/2021 5:12:29 PM By: Levora Dredge Entered By: Levora Dredge on 12/11/2021 14:18:58

## 2021-12-16 NOTE — Telephone Encounter (Addendum)
New referral placed.  However if cognitive issues are improved with remaining off sedating/anticholinergic meds, may not need to see neurologist.

## 2021-12-16 NOTE — Addendum Note (Signed)
Addended by: [name] on: 12/16/2021 04:42 PM   Modules accepted: Orders

## 2021-12-18 ENCOUNTER — Other Ambulatory Visit: Payer: Self-pay

## 2021-12-18 ENCOUNTER — Encounter: Payer: Medicare Other | Admitting: Physician Assistant

## 2021-12-18 DIAGNOSIS — I10 Essential (primary) hypertension: Secondary | ICD-10-CM | POA: Diagnosis not present

## 2021-12-18 DIAGNOSIS — I87331 Chronic venous hypertension (idiopathic) with ulcer and inflammation of right lower extremity: Secondary | ICD-10-CM | POA: Diagnosis not present

## 2021-12-18 DIAGNOSIS — I890 Lymphedema, not elsewhere classified: Secondary | ICD-10-CM | POA: Diagnosis not present

## 2021-12-18 DIAGNOSIS — L97811 Non-pressure chronic ulcer of other part of right lower leg limited to breakdown of skin: Secondary | ICD-10-CM | POA: Diagnosis not present

## 2021-12-18 DIAGNOSIS — J45909 Unspecified asthma, uncomplicated: Secondary | ICD-10-CM | POA: Diagnosis not present

## 2021-12-18 DIAGNOSIS — I872 Venous insufficiency (chronic) (peripheral): Secondary | ICD-10-CM | POA: Diagnosis not present

## 2021-12-18 DIAGNOSIS — L97812 Non-pressure chronic ulcer of other part of right lower leg with fat layer exposed: Secondary | ICD-10-CM | POA: Diagnosis not present

## 2021-12-18 NOTE — Progress Notes (Addendum)
Amber, [name] ([id number]) Visit Report for 12/18/2021 Arrival Information Details Patient Name: [name], [name]. Date of Service: 12/18/2021 2:15 PM Medical Record Number: [id number] Patient Account Number: [id number] Date of Birth/Sex: August 19, 1946 (76 y.o. F) Treating RN: [name] Primary Care [name] Other Clinician: Referring [name]: [name] Treating [name]/Extender: [name] in Treatment: 3 Visit Information History Since Last Visit Added or deleted any medications: No Patient Arrived: Ambulatory Any new allergies or adverse reactions: No Arrival Time: 14:12 Had a fall or experienced change in No Accompanied By: self activities of daily living that may affect Transfer Assistance: None risk of falls: Patient Identification Verified: Yes Hospitalized since last visit: No Secondary Verification Process Completed: Yes Has Dressing in Place as Prescribed: Yes Patient Requires Transmission-Based No Has Compression in Place as Prescribed: Yes Precautions: Pain Present Now: Yes Patient Has Alerts: Yes Patient Alerts: Patient on Blood Thinner 81mg  aspirin Electronic Signature(s) Signed: 12/18/2021 4:20:39 PM By: [name] Entered By: [name] on 12/18/2021 14:16:33 [name], [name] ([id number]) -------------------------------------------------------------------------------- Clinic Level of Care Assessment Details Patient Name: [name], [name]. Date of Service: 12/18/2021 2:15 PM Medical Record Number: [id number] Patient Account Number: [id number] Date of Birth/Sex: 23-May-1946 (76 y.o. F) Treating RN: [name] Primary Care Nicholai Willette: [name] Other Clinician: Referring Oakleigh Hesketh: [name] Treating [name]/Extender: [name] in Treatment: 3 Clinic Level of Care Assessment Items TOOL 1 Quantity Score []  - Use when EandM and Procedure is performed on INITIAL visit 0 ASSESSMENTS - Nursing  Assessment / Reassessment []  - General Physical Exam (combine w/ comprehensive assessment (listed just below) when performed on new 0 pt. evals) []  - 0 Comprehensive Assessment (HX, ROS, Risk Assessments, Wounds Hx, etc.) ASSESSMENTS - Wound and Skin Assessment / Reassessment []  - Dermatologic / Skin Assessment (not related to wound area) 0 ASSESSMENTS - Ostomy and/or Continence Assessment and Care []  - Incontinence Assessment and Management 0 []  - 0 Ostomy Care Assessment and Management (repouching, etc.) PROCESS - Coordination of Care []  - Simple Patient / Family Education for ongoing care 0 []  - 0 Complex (extensive) Patient / Family Education for ongoing care []  - 0 Staff obtains Programmer, systems, Records, Test Results / Process Orders []  - 0 Staff telephones HHA, Nursing Homes / Clarify orders / etc []  - 0 Routine Transfer to another Facility (non-emergent condition) []  - 0 Routine Hospital Admission (non-emergent condition) []  - 0 New Admissions / Biomedical engineer / Ordering NPWT, Apligraf, etc. []  - 0 Emergency Hospital Admission (emergent condition) PROCESS - Special Needs []  - Pediatric / Minor Patient Management 0 []  - 0 Isolation Patient Management []  - 0 Hearing / Language / Visual special needs []  - 0 Assessment of Community assistance (transportation, D/C planning, etc.) []  - 0 Additional assistance / Altered mentation []  - 0 Support Surface(s) Assessment (bed, cushion, seat, etc.) INTERVENTIONS - Miscellaneous []  - External ear exam 0 []  - 0 Patient Transfer (multiple staff / Civil Service fast streamer / Similar devices) []  - 0 Simple Staple / Suture removal (25 or less) []  - 0 Complex Staple / Suture removal (26 or more) []  - 0 Hypo/Hyperglycemic Management (do not check if billed separately) []  - 0 Ankle / Brachial Index (ABI) - do not check if billed separately Has the patient been seen at the hospital within the last three years: Yes Total Score: 0 Level Of  Care: ____ [name] ([id number]) Electronic Signature(s) Signed: 12/18/2021 4:20:39 PM By: [name] Entered By: [name] on 12/18/2021  16:13:53 Amber, [name] ([id number]) -------------------------------------------------------------------------------- Compression Therapy Details Patient Name: [name], [name]. Date of Service: 12/18/2021 2:15 PM Medical Record Number: [id number] Patient Account Number: [id number] Date of Birth/Sex: June 11, 1946 (76 y.o. F) Treating RN: [name] Primary Care [name]: [name] Other Clinician: Referring [name]: [name] Treating [name]/Extender: [name] in Treatment: 3 Compression Therapy Performed for Wound Assessment: Wound #15 Right,Circumferential Lower Leg Performed By: Clinician Amber Dredge, RN Compression Type: Three Layer Post Procedure Diagnosis Same as Pre-procedure Electronic Signature(s) Signed: 12/18/2021 4:13:18 PM By: [name] Entered By: [name] on 12/18/2021 16:13:18 [name] ([id number]) -------------------------------------------------------------------------------- Encounter Discharge Information Details Patient Name: [name], [name]. Date of Service: 12/18/2021 2:15 PM Medical Record Number: [id number] Patient Account Number: [id number] Date of Birth/Sex: 1946-07-07 (76 y.o. F) Treating RN: [name] Primary Care Gabriele Zwilling: [name] Other Clinician: Referring [name]: [name] Treating [name] in Treatment: 3 Encounter Discharge Information Items Discharge Condition: Stable Ambulatory Status: Ambulatory Discharge Destination: Home Transportation: Private Auto Accompanied By: self Schedule Follow-up Appointment: Yes Clinical Summary of Care: Electronic Signature(s) Signed: 12/18/2021 4:15:40 PM By: [name] Entered By: [name] on 12/18/2021 16:15:40 [name]  ([id number]) -------------------------------------------------------------------------------- Lower Extremity Assessment Details Patient Name: [name], [name]. Date of Service: 12/18/2021 2:15 PM Medical Record Number: [id number] Patient Account Number: [id number] Date of Birth/Sex: 1946/03/08 (76 y.o. F) Treating RN: [name] Primary Care Livia Tarr: [name] Other Clinician: Referring Cai Anfinson: [name] Treating Stephane Junkins/Extender: Jeri Cos Weeks in Treatment: 3 Edema Assessment Assessed: [Left: No] [Right: No] Edema: [Left: Ye] [Right: s] Calf Left: Right: Point of Measurement: 30 cm From Medial Instep 40 cm Ankle Left: Right: Point of Measurement: 13 cm From Medial Instep 23 cm Vascular Assessment Pulses: Dorsalis Pedis Palpable: [Right:Yes] Posterior Tibial Palpable: [Right:Yes] Electronic Signature(s) Signed: 12/18/2021 4:20:39 PM By: [name] Entered By: [name] on 12/18/2021 14:29:25 [name], [name] ([id number]) -------------------------------------------------------------------------------- Multi Wound Chart Details Patient Name: [name], [name]. Date of Service: 12/18/2021 2:15 PM Medical Record Number: [id number] Patient Account Number: [id number] Date of Birth/Sex: [birth year] (77 y.o. F) Treating RN: [name] Primary Care Gabriele Loveland: [name] Other Clinician: Referring Lucio Litsey: [name] Treating Cordero Surette/Extender: [name] in Treatment: 3 Vital Signs Height(in): 62 Pulse(bpm): 55 Weight(lbs): 199 Blood Pressure(mmHg): 163/79 Body Mass Index(BMI): 36.4 Temperature(F): 98 Respiratory Rate(breaths/min): 18 Photos: [N/A:N/A] Wound Location: Right, Circumferential Lower Leg N/A N/A Wounding Event: Gradually Appeared N/A N/A Primary Etiology: Lymphedema N/A N/A Secondary Etiology: Venous Leg Ulcer N/A N/A Comorbid History: Cataracts, Asthma, Sleep Apnea, N/A N/A Deep Vein  Thrombosis, Hypertension, Peripheral Venous Disease, Osteoarthritis, Received Chemotherapy, Received Radiation Date Acquired: 10/27/2021 N/A N/A Weeks of Treatment: 3 N/A N/A Wound Status: Open N/A N/A Wound Recurrence: No N/A N/A Measurements L x W x D (cm) 7x14.5x0.1 N/A N/A Area (cm) : 79.718 N/A N/A Volume (cm) : 7.972 N/A N/A % Reduction in Area: 14.70% N/A N/A % Reduction in Volume: 14.70% N/A N/A Classification: Partial Thickness N/A N/A Exudate Amount: Large N/A N/A Exudate Type: Serosanguineous N/A N/A Exudate Color: red, brown N/A N/A Wound Margin: Indistinct, nonvisible N/A N/A Granulation Amount: Medium (34-66%) N/A N/A Granulation Quality: Pink, Pale N/A N/A Necrotic Amount: Medium (34-66%) N/A N/A Exposed Structures: Fat Layer (Subcutaneous Tissue): N/A N/A Yes Fascia: No Tendon: No Muscle: No [name], GLAZA ([id number]) Joint: No Bone: No Epithelialization: None N/A N/A Treatment Notes Electronic Signature(s) Signed: 12/18/2021 4:20:39 PM By: [name] Entered By: [name] on 12/18/2021 14:47:19  [name], [name] ([id number]) -------------------------------------------------------------------------------- Multi-Disciplinary Care Plan Details Patient Name: [name], [name]. Date of Service: 12/18/2021 2:15 PM Medical Record Number: [id number] Patient Account Number: [id number] Date of Birth/Sex: [birth year] (76 y.o. F) Treating RN: [name] Primary Care Arrayah Connors: [name] Other Clinician: Referring Rumaysa Sabatino: [name] Treating Paymon Rosensteel/Extender: [name] in Treatment: 3 Active Inactive Necrotic Tissue Nursing Diagnoses: Impaired tissue integrity related to necrotic/devitalized tissue Knowledge deficit related to management of necrotic/devitalized tissue Goals: Necrotic/devitalized tissue will be minimized in the wound bed Date Initiated: 11/27/2021 Target Resolution Date: 12/25/2021 Goal Status:  Active Patient/caregiver will verbalize understanding of reason and process for debridement of necrotic tissue Date Initiated: 11/27/2021 Target Resolution Date: 12/25/2021 Goal Status: Active Interventions: Assess patient pain level pre-, during and post procedure and prior to discharge Provide education on necrotic tissue and debridement process Notes: Orientation to the Wound Care Program Nursing Diagnoses: Knowledge deficit related to the wound healing center program Goals: Patient/caregiver will verbalize understanding of the [place] Date Initiated: 11/27/2021 Target Resolution Date: 12/25/2021 Goal Status: Active Interventions: Provide education on orientation to the wound center Notes: Soft Tissue Infection Nursing Diagnoses: Impaired tissue integrity Knowledge deficit related to disease process and management Knowledge deficit related to home infection control: handwashing, handling of soiled dressings, supply storage Potential for infection: soft tissue Goals: Patient will remain free of wound infection Date Initiated: 11/27/2021 Target Resolution Date: 12/25/2021 Goal Status: Active Patient/caregiver will verbalize understanding of or measures to prevent infection and contamination in the home setting Date Initiated: 11/27/2021 Target Resolution Date: 12/25/2021 Goal Status: Active Patient's soft tissue infection will resolve Date Initiated: 11/27/2021 Target Resolution Date: 12/25/2021 Goal Status: Active [name], [name] ([id number]) Signs and symptoms of infection will be recognized early to allow for prompt treatment Date Initiated: 11/27/2021 Target Resolution Date: 12/25/2021 Goal Status: Active Interventions: Assess signs and symptoms of infection every visit Provide education on infection Treatment Activities: Culture and sensitivity : 11/27/2021 Education provided on Infection : 11/27/2021 Notes: Venous Leg Ulcer Nursing Diagnoses: Actual  venous Insuffiency (use after diagnosis is confirmed) Knowledge deficit related to disease process and management Potential for venous Insuffiency (use before diagnosis confirmed) Goals: Patient will maintain optimal edema control Date Initiated: 11/27/2021 Target Resolution Date: 12/25/2021 Goal Status: Active Patient/caregiver will verbalize understanding of disease process and disease management Date Initiated: 11/27/2021 Target Resolution Date: 12/25/2021 Goal Status: Active Verify adequate tissue perfusion prior to therapeutic compression application Date Initiated: 11/27/2021 Target Resolution Date: 12/25/2021 Goal Status: Active Interventions: Provide education on venous insufficiency Treatment Activities: Therapeutic compression applied : 11/27/2021 Notes: Wound/Skin Impairment Nursing Diagnoses: Impaired tissue integrity Knowledge deficit related to smoking impact on wound healing Knowledge deficit related to ulceration/compromised skin integrity Goals: Patient/caregiver will verbalize understanding of skin care regimen Date Initiated: 11/27/2021 Target Resolution Date: 12/25/2021 Goal Status: Active Ulcer/skin breakdown will have a volume reduction of 30% by week 4 Date Initiated: 11/27/2021 Target Resolution Date: 12/25/2021 Goal Status: Active Ulcer/skin breakdown will have a volume reduction of 50% by week 8 Date Initiated: 11/27/2021 Target Resolution Date: 01/22/2022 Goal Status: Active Ulcer/skin breakdown will have a volume reduction of 80% by week 12 Date Initiated: 11/27/2021 Target Resolution Date: 02/22/2022 Goal Status: Active Ulcer/skin breakdown will heal within 14 weeks Date Initiated: 11/27/2021 Target Resolution Date: 03/08/2022 Goal Status: Active Interventions: Assess ulceration(s) every visit Provide education on ulcer and skin care [name], [name] ([id number]) Treatment Activities: Skin care regimen initiated : 11/27/2021 Topical wound management  initiated : 11/27/2021  Notes: Electronic Signature(s) Signed: 12/18/2021 4:20:39 PM By: [name] Entered By: [name] on 12/18/2021 14:47:05 Koebel, [name] ([id number]) -------------------------------------------------------------------------------- Pain Assessment Details Patient Name: ZETTA, STONEMAN. Date of Service: 12/18/2021 2:15 PM Medical Record Number: [id number] Patient Account Number: [id number] Date of Birth/Sex: 12/29/1945 (76 y.o. F) Treating RN: [name] Primary Care Jasai Sorg: [name] Other Clinician: Referring Dawayne Ohair: [name] Treating Mozes Sagar/Extender: [name] in Treatment: 3 Active Problems Location of Pain Severity and Description of Pain Patient Has Paino Yes Site Locations Rate the pain. Current Pain Level: 4 Pain Management and Medication Current Pain Management: Electronic Signature(s) Signed: 12/18/2021 4:20:39 PM By: [name] Entered By: [name] on 12/18/2021 14:19:32 Schwartzman, [name] ([id number]) -------------------------------------------------------------------------------- Patient/Caregiver Education Details Patient Name: YOLANDE, SKODA. Date of Service: 12/18/2021 2:15 PM Medical Record Number: [id number] Patient Account Number: [id number] Date of Birth/Gender: 1946-04-03 (76 y.o. F) Treating RN: [name] Primary Care Physician: [name] Other Clinician: Referring Physician: Ria [name] Treating Physician/Extender: [name] in Treatment: 3 Education Assessment Education Provided To: Patient Education Topics Provided Wound/Skin Impairment: Handouts: Caring for Your Ulcer Methods: Explain/Verbal Responses: State content correctly Electronic Signature(s) Signed: 12/18/2021 4:20:39 PM By: [name] Entered By: [name] on 12/18/2021 16:14:19 Hughley, [name]  ([id number]) -------------------------------------------------------------------------------- Wound Assessment Details Patient Name: MARRIE, CHANDRA. Date of Service: 12/18/2021 2:15 PM Medical Record Number: [id number] Patient Account Number: [id number] Date of Birth/Sex: Oct 28, 1946 (76 y.o. F) Treating RN: [name] Primary Care Carolee Channell: [name] Other Clinician: Referring Sidda Humm: [name] Treating Faithlyn Recktenwald/Extender: Jeri Cos Weeks in Treatment: 3 Wound Status Wound Number: 15 Primary Lymphedema Etiology: Wound Location: Right, Circumferential Lower Leg Secondary Venous Leg Ulcer Wounding Event: Gradually Appeared Etiology: Date Acquired: 10/27/2021 Wound Open Weeks Of Treatment: 3 Status: Clustered Wound: No Comorbid Cataracts, Asthma, Sleep Apnea, Deep Vein Thrombosis, History: Hypertension, Peripheral Venous Disease, Osteoarthritis, Received Chemotherapy, Received Radiation Photos Wound Measurements Length: (cm) 7 % Re Width: (cm) 14.5 % Re Depth: (cm) 0.1 Epit Area: (cm) 79.718 Tun Volume: (cm) 7.972 Und duction in Area: 14.7% duction in Volume: 14.7% helialization: None neling: No ermining: No Wound Description Classification: Partial Thickness Foul Wound Margin: Indistinct, nonvisible Slou Exudate Amount: Large Exudate Type: Serosanguineous Exudate Color: red, brown Odor After Cleansing: No gh/Fibrino Yes Wound Bed Granulation Amount: Medium (34-66%) Exposed Structure Granulation Quality: Pink, Pale Fascia Exposed: No Necrotic Amount: Medium (34-66%) Fat Layer (Subcutaneous Tissue) Exposed: Yes Necrotic Quality: Adherent Slough Tendon Exposed: No Muscle Exposed: No Joint Exposed: No Bone Exposed: No Treatment Notes Wound #15 (Lower Leg) Wound Laterality: Right, Circumferential Cleanser Soap and Water HURLEY, BLEVINS ([id number]) Discharge Instruction: Gently cleanse wound with antibacterial soap, rinse and pat dry prior  to dressing wounds Peri-Wound Care Topical Triamcinolone Acetonide Cream, 0.1%, 15 (g) tube Discharge Instruction: Apply as directed by Claretha Townshend. apply to wound and leg Primary Dressing Xtrasorb Classic Super Absorbent Dressing, 6x9 (in/in) Discharge Instruction: over open areas of the leg that are weeping Secondary Dressing Secured With Compression Wrap Profore Lite LF 3 Multilayer Compression Bandaging System Discharge Instruction: Apply 3 multi-layer wrap as prescribed. Compression Stockings Add-Ons Electronic Signature(s) Signed: 12/18/2021 4:20:39 PM By: [name] Entered By: [name] on 12/18/2021 14:28:48 DIANELLY, FERRAN ([id number]) -------------------------------------------------------------------------------- Vitals Details Patient Name: ILAYDA, TODA. Date of Service: 12/18/2021 2:15 PM Medical Record Number: [id number] Patient Account Number: [id number] Date of Birth/Sex: 1946-08-19 (76 y.o. F) Treating RN: [name] Primary Care Zakara Parkey: [name] Other Clinician: Referring Fallon Haecker: [name]  Treating Erron Wengert/Extender: Jeri Cos Weeks in Treatment: 3 Vital Signs Time Taken: 14:16 Temperature (F): 98 Height (in): 62 Pulse (bpm): 55 Weight (lbs): 199 Respiratory Rate (breaths/min): 18 Body Mass Index (BMI): 36.4 Blood Pressure (mmHg): 163/79 Reference Range: 80 - 120 mg / dl Electronic Signature(s) Signed: 12/18/2021 4:20:39 PM By: [name] Entered By: [name] on 12/18/2021 14:19:23

## 2021-12-18 NOTE — Telephone Encounter (Signed)
[facility] does not have a sooner appt than [facility]  Pt is scheduled with [facility] on 01/04/22, [facility] requested that she keep her scheduled appt  FYI

## 2021-12-18 NOTE — Progress Notes (Addendum)
RIMSHA, TREMBLEY ([id number]) Visit Report for 12/18/2021 Chief Complaint Document Details Patient Name: [name], [name]. Date of Service: 12/18/2021 2:15 PM Medical Record Number: [id number] Patient Account Number: [id number] Date of Birth/Sex: [birth year] (76 y.o. F) Treating RN: Levora Dredge Primary Care Provider: [name] Other Clinician: Referring Provider: [name] Treating Provider/Extender: [name] in Treatment: 3 Information Obtained from: Patient Chief Complaint Right LE Ulcer Electronic Signature(s) Signed: 12/18/2021 2:03:40 PM By: Worthy Keeler PA-C Entered By: Worthy Keeler on 12/18/2021 14:03:39 Scaduto, [name] ([id number]) -------------------------------------------------------------------------------- HPI Details Patient Name: [name], [name]. Date of Service: 12/18/2021 2:15 PM Medical Record Number: [id number] Patient Account Number: [id number] Date of Birth/Sex: Aug 17, 1946 (77 y.o. F) Treating RN: Levora Dredge Primary Care Provider: [name] Other Clinician: Referring Provider: [name] Treating Provider/Extender: [name] in Treatment: 3 History of Present Illness HPI Description: Pleasant 76 year old with history of chronic venous insufficiency. No diabetes or peripheral vascular disease. Left ABI 1.29. Questionable history of left lower extremity DVT. She developed a recurrent ulceration on her left lateral calf in December 2015, which she attributes to poor diet and subsequent lower extremity edema. She underwent endovenous laser ablation of her left greater saphenous vein in 2010. She underwent laser ablation of accessory branch of left GSV in April 2016 by Dr. Kellie Simmering at [facility]. She was previously wearing Unna boots, which she tolerated well. Tolerating 2 layer compression and cadexomer iodine. She returns to clinic for follow-up and is without new complaints. She denies any significant pain at this  time. She reports persistent pain with pressure. No claudication or ischemic rest pain. No fever or chills. No drainage. READMISSION 11/13/16; this is a 76 year old woman who is not a diabetic. She is here for a review of a painful area on her left medial lower extremity. I note that she was seen here previously last year for wound I believe to be in the same area. At that time she had undergone previously a left greater saphenous vein ablation by Dr. Kellie Simmering and she had a ablation of the anterior accessory branch of the left greater saphenous vein in March 2016. Seeing that the wound actually closed over. In reviewing the history with her today the ulcer in this area has been recurrent. She describes a biopsy of this area in 2009 that only showed stasis physiology. She also has a history of today malignant melanoma in the right shoulder for which she follows with Dr. 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 Do not see that she has ever had arterial studies in the left leg. She tells me she has a follow-up with Dr. Kellie Simmering in roughly 10 days In any case she developed the reopening of this area roughly a month ago. On the background of this she describes rapidly increasing edema which has responded to Lasix 40 mg and metolazone 2.5 mg as well as the patient's lymph massage. She has been told she has both venous insufficiency and lymphedema but she cannot tolerate compression stockings 11/28/16; the patient saw Dr. Kellie Simmering recently. Per the patient he did arterial Dopplers in the office that did not show evidence of arterial insufficiency, per the patient he stated "treat this like an ordinary venous ulcer". She also saw her dermatologist Dr. Ronnald [name] who felt that this was more of a vascular ulcer. In general things are improving although she  arrives today with increasing bilateral lower  extremity edema with weeping a deeper fluid through the wound on the left medial leg compatible with some degree of lymphedema 12/04/16; the patient's wound is fully epithelialized but I don't think fully healed. We will do another week of depression with Promogran and TCA however I suspect we'll be able to discharge her next week. This is a very unusual-looking wound which was initially a figure-of-eight type wound lying on its side surrounded by petechial like hemorrhage. She has had venous ablation on this side. She apparently does not have an arterial issue per Dr. Kellie Simmering. She saw her dermatologist thought it was "vascular". Patient is definitely going to need ongoing compression and I talked about this with her today she will go to elastic therapy after she leaves here next week 12/11/16; the patient's wound is not completely closed today. She has surrounding scar tissue and in further discussion with the patient it would appear that she had ulcers in this area in 2009 for a prolonged period of time ultimately requiring a punch biopsy of this area that only showed venous insufficiency. I did not previously pickup on this part of the history from the patient. 12/18/16; the patient's wound is completely epithelialized. There is no open area here. She has significant bilateral venous insufficiency with secondary lymphedema to a mild-to-moderate degree she does not have compression stockings.. She did not say anything to me when I was in the room, she told our intake nurse that she was still having pain in this area. This isn't unusual recurrent small open area. She is going to go to elastic therapy to obtain compression stockings. 12/25/16; the patient's wound is fully epithelialized. There is no open area here. The patient describes some continued episodic discomfort in this area medial left calf. However everything looks fine and healed here. She is been to elastic therapy and caught herself 15-20 mmHg  stockings, they apparently were having trouble getting 20-30 mm stockings in her size 01/22/17; this is a patient we discharged from the clinic a month ago. She has a recurrent open wound on her medial left calf. She had 15 mm support stockings. I told her I thought she needed 20-30 mm compression stockings. She tells me that she has been ill with hospitalization secondary to asthma and is been found to have severe hypokalemia likely secondary to a combination of Lasix and metolazone. This morning she noted blistering and leaking fluid on the posterior part of her left leg. She called our intake nurse urgently and we was saw her this afternoon. She has not had any real discomfort here. I don't know that she's been wearing any stockings on this leg for at least 2-3 days. ABIs in this clinic were 1.21 on the right and 1.3 on the left. She is previously seen vascular surgery who does not think that there is a peripheral arterial issue. 01/30/17; Patient arrives with no open wound on the left leg. She has been to elastic therapy and obtained 20-2mhg below knee stockings and she has one on the right leg today. READMISSION 02/19/18; this PMachois a now 76year old patient we've had in this clinic perhaps 3 times before. I had last looked at her from January 07 December 2016 with an area on the medial left leg. We discharged her on 12/25/16 however she had to be readmitted on 01/22/17 with a recurrence. I have in my notes that we discharged her on 20-30 mm stockings although she tells me  she was only wearing support hose because she cannot get stockings on predominantly related to her cervical spine surgery/issues. She has had previous ablations done by vein and vascular in [place] including a great saphenous vein ablation on the left with an anterior accessory branch ablation I think both of these were in 2016. On one of the previous visit she had a biopsy noted 2009 that was negative. She is not felt to  have an arterial issue. She is not a diabetic. She does have a history of obstructive sleep apnea hypertension asthma as well as chronic venous insufficiency and lymphedema. On this occasion she noted 2 dry scaly patch on her left leg. She tried to put lotion on this it didn't really help. There were 2 open areas.the patient has been seeing her primary physician from 02/05/18 through 02/14/18. She had Unna boots applied. The superior wound now on the lateral left leg has closed but she's had one wound that remains open on the lateral left leg. This is not the same spot as we dealt with in 2018. ABIs in this clinic were 1.3 bilaterally JAZYAH, BUTSCH ([id number]) 02/26/18; patient has a small wound on the left lateral calf. Dimensions are down. She has chronic venous insufficiency and lymphedema. 03/05/18; small open area on the left lateral calf. Dimensions are down. Tightly adherent necrotic debris over the surface of the wound which was difficult to remove. Also the dressing [over collagen] stuck to the wound surface. This was removed with some difficulty as well. Change the primary dressing to Hydrofera Blue ready 03/12/18; small open area on the left lateral calf. Comes in with tightly adherent surface eschar as well as some adherent Hydrofera Blue. 03/19/18; open area on the left lateral calf. Again adherent surface eschar as well as some adherent Hydrofera Blue nonviable subcutaneous tissue. She complained of pain all week even with the reduction from 4-3 layer compression I put on last week. Also she had an increase in her ankle and calf measurements probably related to the same thing. 03/26/18; open area on the left lateral calf. A very small open area remains here. We used silver alginate starting last week as the Hydrofera Blue seem to stick to the wound bed. In using 4-layer compression 04/02/18; the open area in the left lateral calf at some adherent slough which I removed there is no open area  here. We are able to transition her into her own compression stocking. Truthfully I think this is probably his support hose. However this does not maintain skin integrity will be limited. She cannot put over the toe compression stockings on because of neck problems hand problems etc. She is allergic to the lining layer of juxta lites. We might be forced to use extremitease stocking should this fail READMIT 11/24/2018 Patient is now a 76 year old woman who is not a diabetic. She has been in this clinic on at least 3 previous occasions largely with recurrent wounds on her left leg secondary to chronic venous insufficiency with secondary lymphedema. Her situation is complicated by inability to get stockings on and an allergy to neoprene which is apparently a component and at least juxta lites and other stockings. As a result she really has not been wearing any stockings on her legs. She tells [place] that roughly 2 or 3 weeks ago she started noticing a stinging sensation just above her ankle on the left medial aspect. She has been diagnosed with pseudogout and she wondered whether this was what she was  experiencing. She tried to dress this with something she bought at the store however subsequently it pulled skin off and now she has an open wound that is not improving. She has been using Vaseline gauze with a cover bandage. She saw her primary doctor last week who put an Haematologist on her. ABIs in this clinic was 1.03 on the left 2/12; the area is on the left medial ankle. Odd-looking wound with what looks to be surface epithelialization but a multitude of small petechial openings. This clearly not closed yet. We have been using silver alginate under 3 layer compression with TCA 2/19; the wound area did not look quite as good this week. Necrotic debris over the majority of the wound surface which required debridement. She continues to have a multitude of what looked to be small petechial openings. She  reminds [place] that she had a biopsy on this initially during her first outbreak in 2015 in [place] dermat[facility]. She expresses concern about this being a possible melanoma. 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 I assured her that this does not look anything like melanoma. Except for the petechial reaction it does look like a venous insufficiency area and she certainly has evidence of this on both sides 2/26; a difficult area on the left medial ankle. The patient clearly has chronic venous hypertension with some degree of lymphedema. The odd thing about the area is the small petechial hemorrhages. I am not really sure how to explain this. This was present last time and this is not a compression injury. We have been using Hydrofera Blue which I changed to last week 3/4; still using Hydrofera Blue. Aggressive debridement today. She does not have known arterial issues. She has seen Dr. Kellie Simmering at [facility] vein and vascular and and has an ablation on the left. [Anterior accessory branch of the greater saphenous]. From what I remember they did not feel she had an arterial issue. The patient has had this area biopsied in 2009 at [facility] dermat[facility] and by her recollection they said this was "stasis". She is also follow-up with dermatology locally who thought that this was more of a vascular issue 3/11; using Hydrofera Blue. Aggressive debridement today. She does not have an arterial issue. We are using 3 layer compression although we may need to go to 4. The patient has been in for multiple changes to her wrap since I last saw her a week ago. She says that the area was leaking. I do not have too much more information on what was found 01/19/19 on evaluation today patient was actually being seen for a nurse visit when unfortunately she had the area on her left lateral lower extremity as well as weeping from the  right lower extremity that became apparent. Therefore we did end up actually seeing her for a full visit with myself. She is having some pain at this site as well but fortunately nothing too significant at this point. No fevers, chills, nausea, or vomiting noted at this time. 3/18-Patient is back to the clinic with the left leg venous leg ulcer, the ulcer is larger in size, has a surface that is densely adherent with fibrinous tissue, the Hydrofera Blue was used but is densely adherent and there was difficulty in removing it. The right lower extremity was also wrapped for weeping edema. Patient has a new area over the left lateral foot above the malleolus that is  small and appears to have no debris with intact surrounding skin. Patient is on increased dose of Lasix also as a means to edema management 3/25; the patient has a nonhealing venous ulcer on the medial left leg and last week developed a smaller area on the lateral left calf. We have been using Hydrofera Blue with a contact layer. 4/1; no major change in these wounds areas. Left medial and more recently left lateral calf. I tried Iodoflex last week to aid in debridement she did not tolerate this. She stated her pain was terrible all week. She took the top layer of the 4 layer compression off. 4/8; the patient actually looks somewhat better in terms of her more prominent left lateral calf wound. There is some healthy looking tissue here. She is still complaining of a lot of discomfort. 4/15; patient in a lot of pain secondary to sciatica. She is on a prednisone taper prescribed by her primary physician. She has the 2 areas one on the left medial and more recently a smaller area on the left lateral calf. Both of these just above the malleoli 4/22; her back pain is better but she still states she is very uncomfortable and now feels she is intolerant to the [employer]. No real change in the wounds we have been using Sorbact. She has been  previously intolerant to Iodoflex. There is not a lot of option about what we can use to debride this wound under compression that she no doubt needs. sHe states Ultram no longer works for her pain 4/29; no major change in the wounds slightly increased depth. Surface on the original medial wound perhaps somewhat improved however the more recent area on the lateral left ankle is 100% covered in very adherent debris we have been using Sorbact. She tolerates 4 layer compression well and her edema control is a lot better. She has not had to come in for a nurse check 5/6; no major change in the condition of the wounds. She did consent to debridement today which was done with some difficulty. Continuing Sorbact. She did not tolerate Iodoflex. She was in for a check of her compression the day after we wrapped her last week this was adjusted but nothing much was found 5/13; no major change in the condition or area of the wounds. I was able to get a fairly aggressive debridement done on the lateral left leg wound. Even using Sorbact under compression. She came back in on Friday to have the wrap changed. She says she felt uncomfortable on the lateral aspect of her ankle. She has a long history of chronic venous insufficiency including previous ablation surgery on this side. 5/20-Patient returns for wounds on left leg with both wounds covered in slough, with the lateral leg wound larger in size, she has been in 3 layer compression and felt more comfortable, she describes pain in ankle, in leg and pins and needles in foot, and is about to try Pamelor for this 6/3; wounds on the left lateral and left medial leg. The area medially which is the most recent of the 2 seems to have had the largest increase in [place], LEYANNA BITTMAN. ([id number]) dimensions. We have been using Sorbac to try and debride the surface. She has been to see orthopedics they apparently did a plain x-Amber that was indeterminant. Diagnosed her with  neuropathy and they have ordered an MRI to determine if there is underlying osteomyelitis. This was not high on my thought list but I suppose  it is prudent. We have advised her to make an appointment with vein and vascular in [place]. She has a history of a left greater saphenous and accessory vein ablations I wonder if there is anything else that can be done from a surgical point of view to help in these difficult refractory wounds. We have previously healed this wound on one occasion but it keeps on reopening [medial side] 6/10; deep tissue culture I did last week I think on the left medial wound showed both moderate E. coli and moderate staph aureus [MSSA]. She is going to require antibiotics and I have chosen Augmentin. We have been using Sorbact and we have made better looking wound surface on both sides but certainly no improvement in wound area. She was back in last Friday apparently for a dressing changes the wrap was hurting her outer left ankle. She has not managed to get a hold of vein and vascular in [place]. We are going to have to make her that appointment 6/17; patient is tolerating the Augmentin. She had an MRI that I think was ordered by orthopedic surgeon this did not show osteomyelitis or an abscess did suggest cellulitis. We have been using Sorbact to the lateral and medial ankles. We have been trying to arrange a follow-up appointment with vein and vascular in [place] or did her original ablations. We apparently an area sent the request to vein and vascular in [facility] 6/24; patient has completed the Augmentin. We do not yet have a vein and vascular appointment in [place]. I am not sure what the issue is here we have asked her to call tomorrow. We are using Sorbact. Making some improvements and especially the medial wound. Both surfaces however look better medial and lateral. 7/1; the patient has been in contact with vein and vascular in [place] but has not yet  received an appointment. Using Sorbact we have gradually improve the wound surface with no improvement in surface area. She is approved for Apligraf but the wound surface still is not completely viable. She has not had to come in for a dressing change 7/8; the patient has an appointment with vein and vascular on 7/31 which is a Friday afternoon. She is concerned about getting back here for [place] to dress her wounds. I think it is important to have them goal for her venous reflux/history of ablations etc. to see if anything else can be done. She apparently tested positive for 1 of the blood tests with regards to lupus and saw a rheumatologist. He has raised the issue of vasculitis again. I have had this thought in the past however the evidence seems overwhelming that this is a venous reflux etiology. If the rheumatologist tells me there is clinical and laboratory investigation is positive for lupus I will rethink this. 7/15; the patient's wound surfaces are quite a bit better. The medial area which was her original wound now has no depth although the lateral wound which was the more recent area actually appears larger. Both with viable surfaces which is indeed better. Using Sorbact. I wanted to use Apligraf on her however there is the issue of the vein and vascular appointment on 7/31 at 2:00 in the afternoon which would not allow her to get back to be rewrapped and they would no doubt remove the graft 7/22; the patient's wound surfaces have moderate amount of debris although generally look better. The lateral one is larger with 2 small satellite areas superiorly. We are waiting for her vein and  vascular appointment on 7/31. She has been approved for Apligraf which I would like to use after th 7/29; wound surfaces have improved no debridement is required we have been using Sorbact. She sees vein and vascular on Friday with this so question of whether anything can be done to lessen the likelihood of  recurrence and/or speed the healing of these areas. She is already had previous ablations. She no doubt has severe venous hypertension 8/5-Patient returns at 1 week, she was in [place] for 3 days by her podiatrist, we have been using so backed to the wound, she has increased pain in both the wounds on the left lower leg especially the more distal one on the lateral aspect 8/12-Patient returns at 1 week and she is agreeable to having debridement in both wounds on her left leg today. We have been using Sorbact, and vascular studies were reviewed at last visit 8/19; the patient arrives with her wounds fairly clean and no debridement is required. We have used Sorbact which is really done a nice job in cleaning up these very difficult wound surfaces. The patient saw Dr. Donzetta Matters of vascular surgery on 7/31. He did not feel that there was an arterial component. He felt that her treated greater saphenous vein is adequately addressed and that the small saphenous vein did not appear to be involved significantly. She was also noted to have deep venous reflux which is not treatable. Dr. Donzetta Matters mentioned the possibility of a central obstructive component leading to reflux and he offered her central venography. She wanted to discuss this or think about it. I have urged her to go ahead with this. She has had recurrent difficult wounds in these areas which do heal but after months in the clinic. If there is anything that can be done to reduce the likelihood of this I think it is worth it. 9/2 she is still working towards getting follow-up with Dr. Donzetta Matters to schedule her CT. Things are quite a bit worse venography. I put Apligraf on 2 weeks ago on both wounds on the medial and lateral part of her left lower leg. She arrives in clinic today with 3 superficial additional wounds above the area laterally and one below the wound medially. She describes a lot of discomfort. I think these are probably wrapped injuries. Does not  look like she has cellulitis. 07/20/2019 on evaluation today patient appears to be doing somewhat poorly in regard to her lower extremity ulcers. She in fact showed signs of erythema in fact we may even be dealing with an infection at this time. Unfortunately I am unsure if this is just infection or if indeed there may be some allergic reaction that occurred as a result of the Apligraf application. With that being said that would be unusual but nonetheless not impossible in this patient is one who is unfortunately allergic to quite a bit. Currently we have been using the Sorbact which seems to do as well as anything for her. I do think we may want to obtain a culture today to see if there is anything showing up there that may need to be addressed. 9/16; noted that last week the wounds look worse in 1 week follow-up of the Apligraf. Using Sorbact as of 2 days ago. She arrives with copious amounts of drainage and new skin breakdown on the back of the left calf. The wounds arm more substantial bilaterally. There is a fair amount of swelling in the left calf no overt DVT there  is edema present I think in the left greater than right thigh. She is supposed to go on 9/28 for CT venography. The wounds on the medial and lateral calf are worse and she has new skin breakdown posteriorly at least new for me. This is almost developing into a circumferential wound area The Apligraf was taken off last week which I agree with things are not going in the right direction a culture was done we do not have that back yet. She is on Augmentin that she started 2 days ago 9/23; dressing was changed by her nurses on Monday. In general there is no improvement in the wound areas although the area looks less angry than last week. She did get Augmentin for MSSA cultured on the 14th. She still appears to have too much swelling in the left leg even with 3 layer compression 9/30; the patient underwent her procedure on 9/28 by Dr.  Donzetta Matters at [facility]. She was discovered to have the common iliac vein measuring 12.2 mm but at the level of L4-L5 measured 3 mm. After stenting it measured 10 mm. 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 Rouleaux flow in the common femoral and femoral vein was observed much improved after stenting. We are using silver alginate to the wounds on the medial and lateral ankle on the left. 4 layer compression 10/7; the patient had fluid swelling around her knee and 4 layer compression. At the advice of vein and vascular this was reduced to 3 layer which she is tolerating better. We have been using silver alginate under 3 layer compression since last Friday 10/14; arrives with the areas on the left ankle looking a lot better. Inflammation in the area also a lot better. She came in for a nurse check on 10/9 10/21; continued nice improvement. Slight improvements in surface area of both the medial and lateral wounds on the left. A lot of the satellite lesions in the weeping erythema around these from stasis dermatitis is resolved. We have been using silver alginate NATOSHA, BOU ([id number]) 10/28; general improvement in the entire wound areas although not a lot of change in dimensions the wound certainly looks better. There is a lot less in terms of venous inflammation. Continue silver alginate this week however look towards Hydrofera Blue next week 11/4; very adherent debris on the medial wound left wound is not as bad. We have been using silver alginate. Change to [facility] today 11/11; very adherent debris on both wound areas. She went to vein and vascular last week and follow-up they put in [place] boot on this today. He says the [facility] was adherent. Wound is definitely not as good as last week. Especially on the left there the satellite lesions look more prominent 11/18; absolutely no better. erythema on lateral aspect with tenderness. 09/30/2019 on  evaluation today patient appears to actually be doing better. Dr. Dellia Nims did put her on doxycycline last week which I do believe has helped her at this point. Fortunately there is no signs of active infection at this time. No fevers, chills, nausea, vomiting, or diarrhea. I do believe he may want extend the doxycycline for 7 additional days just to ensure everything does completely cleared up the patient is in agreement with that plan. Otherwise she is going require some sharp debridement today 12/2; patient is completing a 2-week course of doxycycline. I gave her this empirically for inflammation as well as infection when I last saw her  2 weeks ago. All of this seems to be better. She is using silver alginate she has the area on the medial aspect of the larger area laterally and the 2 small satellite regions laterally above the major wound. 12/9; the patient's wound on the left medial and left lateral calf look really quite good. We have been using silver alginate. She saw vein and vascular in follow-up on 10/09/2019. She has had a previous left greater saphenous vein ablation by Dr. Oscar La in 2016. More recently she underwent a left common iliac vein stent by Dr. 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 The swelling is improved and certainly the wounds have improved. The patient shows [place] today area on the right medial calf there is almost no wound but leaking lymphedema. She says she start this started 3 or 4 days ago. She did not traumatize it. It is not painful. She does not wear compression on that side 12/16; the patient continues to do well laterally. Medially still requiring debridement. The area on the right calf did not materialize to anything and is not currently open. We wrapped this last time. She has support stockings for that leg although I am not sure they are going to provide adequate compression 12/23; the lateral wound looks stable. Medially still requiring debridement for  tightly adherent fibrinous debris. We've been using silver alginate. Surface area not any different 12/30; neither wound is any better with regards to surface and the area on the left lateral is larger. I been using silver alginate to the left lateral which look quite good last week and Sorbact to the left medial 11/11/2019. Lateral wound area actually looks better and somewhat smaller. Medial still requires a very aggressive debridement today. We have been using Sorbact on both wound areas 1/13; not much better still adherent debris bilaterally. I been using Sorbact. She has severe venous hypertension. Probably some degree of dermal fibrosis distally. I wonder whether tighter compression might help and I am going to try that today. We also need to work on the bioburden 1/20; using Sorbact. She has severe venous hypertension status post stent placement for pelvic vein compression. We applied gentamicin last time to see if we could reduce bioburden I had some discussion with her today about the use of pentoxifylline. This is occasionally used in this setting for wounds with refractory venous insufficiency. However this interacts with Plavix. She tells me that she was put on this after stent placement for 3 months. She will call Dr. Claretha Cooper office to discuss 1/27; we are using gentamicin under Sorbact. 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 She has a stent. Wound medially is measuring smaller this week. Laterally measuring slightly larger although she has some satellite lesions superiorly 2/3; gentamicin under Sorbact under 4-layer compression. 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 She has a stent on Plavix. Her wounds are measuring smaller this week. More substantially laterally where there is a satellite lesion superiorly. 2/10; gentamicin under Sorbac. 4-layer compression. Patient communicated with Dr. Donzetta Matters at vein and vascular in  [place]. He is okay with the patient coming off Plavix I will therefore start her on pentoxifylline for a 1 month trial. In general her wounds look better today. I had some concerns about swelling in the left thigh however she measures 61.5 on the right and 63 on the mid thigh which does not suggest there is any difficulty. The patient is not describing any pain. 2/17; gentamicin under  Sorbac 4-layer compression. She has been on pentoxifylline for 1 week and complains of loose stool. No nausea she is eating and drinking well 2/24; the patient apparently came in 2 days ago for a nurse visit when her wrap fell down. Both areas look a little worse this week macerated medially and satellite lesions laterally. Change to silver alginate today 3/3; wounds are larger today especially medially. She also has more swelling in her foot lower leg and I even noted some swelling in her posterior thigh which is tender. I wonder about the patency of her stent. Fortuitously she sees Dr. Claretha Cooper group on Friday 3/10; Mrs. Guerrier was seen by vein and vascular on 3/5. The patient underwent ultrasound. There was no evidence of thrombosis involving the IVC no evidence of thrombosis involving the right common iliac vein there is no evidence of thrombosis involving the right external iliac vein the left external vein is also patent. The right common iliac vein stent appears patent bilateral common femoral veins are compressible and appear patent. I was concerned about the left common iliac stent however it looks like this is functional. She has some edema in the posterior thigh that was tender she still has that this week. I also note they had trouble finding the pulses in her left foot and booked her for an ABI baseline in 4 weeks. She will follow up in 6 months for repeat IVC duplex. The patient stopped the pentoxifylline because of diarrhea. It does not look like that was being effective in any case. I have advised her  to go back on her aspirin 81 mg tablet, vascular it also suggested this 3/17; comes in today with her wound surfaces a lot better. The excoriations from last week considerably better probably secondary to the TCA. We have been using silver alginate 3/24; comes in today with smaller wounds both medially and laterally. Both required debridement. There are 2 small satellite areas superiorly laterally. She also has a very odd bandlike area in the mid calf almost looking like there was a weakness in the wrap in a localized area. I would write this off as being this however anteriorly she has a small raised ballotable area that is very tender almost reminiscent of an abscess but there was no obvious purulent surface to it. 02/04/20 upon evaluation today patient appears to be doing fairly well in regard to her wounds today. Fortunately there is no signs of active infection at this time. No fevers, chills, nausea, vomiting, or diarrhea. She has been tolerating the dressing changes without complication. Fortunately I feel like she is showing signs of improvement although has been sometime since have seen her. Nonetheless the area of concern that Dr. Dellia Nims had last week where she had possibly an area of the wrap that was we can allow the leg to bulge appears to be doing significantly better today there is no signs of anything worsening. MIRABEL, AHLGREN ([id number]) 4/7; the patient's wounds on her medial and lateral left leg continue to contract. We have been using a regular alginate. Last week she developed an area on the right medial lower leg which is probably a venous ulcer as well. 4/14; the wounds on her left medial and lateral lower leg continue to contract. Surface eschar. We have been using regular alginate. The area on the right medial lower leg is closed. We have been putting both legs under 4-layer contraction. The patient went back to see vein and vascular she had arterial studies done  which  were apparently "quite good" per the patient although I have not read their notes I have never felt she had an arterial issue. 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 This is been longstanding and refractory to exercise, leg elevation and longstanding use of compression wraps in our clinic as well as compression stockings on the times we have been able to get these to heal 4/21; we thought she actually might be close this week however she arrives in clinic with a lot of edema in her upper left calf and into her posterior thigh. This is been an intermittent problem here. She says the wrap fell down but it was replaced with a nurse visit on Monday. We are using calcium alginate to the wounds and the wound sizes there not terribly larger than last week but there is a lot more edema 4/28; again wound edges are smaller on both sides. Her edema is better controlled than last time. She is obtained her compression pumps from [employer] although they have not been to her home to set these up. 5/5; left medial and left lateral both look stable. I am not sure the medial is any smaller. We have been using calcium alginate under 4-layer compression. oShe had an area on the right medial. This was eschared today. We have been wrapping this as well. She does not tolerate external compression stockings due to a history of various contact allergies. She has her compression pumps however the representative from the company is coming on her to show her how to use these tomorrow 5/19; patient with severe chronic venous insufficiency secondary to central venous disease. She had a stent placed in her left common iliac vein. She has done better since but still difficult to control wounds. She comes in today with nothing open on the right leg. Her areas on the left medial and left lateral are just about closed. We are using calcium alginate under 4-layer compression.  She is using her external compression pumps at home She only has 15-20 support stockings. States she cannot get anything tighter than that on. 03/30/20-Patient returns at 1 week, the wounds on the left leg are both slightly bigger, the last week she was on 3 layer compression which started to slide down. She is starting to use her lymphedema pumps although she stated on 1 day her right ankle started to swell up and she have to stop that day. Unfortunately the open area seem to oscillate between improving to the point of healing and then flaring up all to do with effectiveness of compression or lack of due to the left leg topography not keeping the compression wraps from rolling down 6/2 patient comes in with a 15/20 mmHg stocking on the right leg. She tells me that she developed a lot of swelling in her ankles she saw orthopedics she was felt to possibly be having a flare of pseudogout versus some other type of arthritis. She was put on steroids for a respiratory issue so that helps with the inflammation. She has not been using the pumps all week. She thinks the left thigh is more swollen than usual and I would agree with that. She has an appointment with Dr. Donzetta Matters 9 days or so from now 6/9; both wounds on the left medial and left lateral are smaller. We have been using calcium alginate under compression. She does not have an open wound on the right leg she is using a stocking and her  compression pumps things are going well. She has an appointment with Dr. Donzetta Matters with regards to her stent in the left common iliac vein 6/16; the wounds on the left medial and left lateral ankle continues to contract. The patient saw Dr. Donzetta Matters and I think he seems satisfied. Ordered follow-up venous reflux studies on both sides in September. Cautioned that she may need thigh-high stockings. She has been using calcium alginate under compression on the left and her own stocking on the right leg. She tells [place] there are no open  wounds on the right 6/23; left lateral is just about closed. Medial required debridement today. We have been using calcium alginate. Extensive discussion about the compression pumps she is only using these on 25 mmHg states she could not take 40 or 30 when the wrap came out to her home to demonstrate these. He said they should not feel tight 6/30; the left lateral wound has a slight amount of eschar. . The area medially is about the same using Hydrofera Blue. 7/7; left lateral wound still has some eschar. I will remove this next week may be closed. The area medially is very small using Hydrofera Blue with improvement. Unfortunately the stockings fell down. Unfortunately the blisters have developed at the edge of where the wrap fell. When this happened she says her legs hurt she did not use her pumps. We are not open Monday for her to come in and change the wraps and she had an appointment yesterday. She also tells me that she is going to have an MRI of her back. She is having pain radiating into her left anterior leg she thinks her from an L5 disc. She saw Dr. Ellene Route of neurosurgery 7/14; the area on the left lateral ankle area is closed. Still a small area medially however it looks better as well. We have been using Hydrofera Blue under 4-layer compression 7/21; left lateral ankle is still closed however her wound on the medial left calf is actually larger. This is probably because Hydrofera Blue got stuck to the wound. She came in for a nurse change on Friday and will do that again this week I was concerned about the amount of swelling that she had last week however she is using her compression pumps twice a day and the swelling seems well controlled 7/28; remaining wound on the left medial lower leg is smaller. We have been using moistened silver collagen under compression she is coming back for a nurse visit. For reasons that were not really clear she was just keeping her legs elevated and not  using her compression pumps. I have asked her to use the compression pumps. She does not have any wounds on the right leg 06/15/20-Patient returns at 2 weeks, her LLE edema is worse and she developed a blister wound that is new and has bigger posterior calf wound on right, we are using Prisma with pad, 4 layer compression. she has been on lasix 40 mg daily 8/18; patient arrives today with things a lot worse than I remember from a few weeks ago. She was seen last week. Noted that her edema was worse and that she now had a left lateral wound as well as deteriorating edema in the medial and posterior part of the lower leg. She says she is using his or her external compression pumps once a day although I wonder about the compliance. 8/25; weeping area on the right medial lower leg. This had actually gotten a small localized area  of her compression stocking wet. oOn the left side there is a large denuded area on the posterior medial lower leg and smaller area on the lateral. This was not the original areas that we dealt with. 9/1 the patient's wound on the left leg include the left lateral and left posterior. Larger superficial wounds weeping. She has very poor edema control. Tender localized edema in the left lower medial ankle/heel probably because of localized wrap issues. She freely admits she is not using the compression pumps. She has been up on her feet a lot. She thinks the hydrofera blue is contributing to the pain she is experiencing.. This is a complaint that I have occasionally heard 9/8; really not much improvement. The patient is still complaining of a lot of pain particularly when she uses compression pumps. I switched her to silver alginate last time because she found the Hydrofera Blue to be irritating. I don't hear much difference in her description with the silver alginate. 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 She has follow-up with Dr. Donzetta Matters I think for ultrasound next month [name], [name] ([id number]) 9/15; quite a bit of improvement today. We have less edema and more epithelialization in both of her wound areas on the left medial and left lateral calf. These are not the site of her original wounds in this area. She says she has been using her compression pumps for 30 minutes twice a day, there pain issues that never quite understood. Silver alginate as the primary dressing 9/22; continued improvement. Both areas medially and laterally still have a small open area there is some eschar. She continues to complain of left medial ankle pain. Swelling in the leg is in much better condition. We have been using silver alginate 9/29; continued improvement. Both areas medially and laterally in the left calf look as though they are close some minor surface eschar but I think this is epithelialized. She comes in today saying she has a ruptured disc at L4-L5 cannot bend over to put on her stockings. 10/6; patient comes in today with no open wounds on either leg. However her edema on the left leg in the upper one third of the lower leg is poorly controlled nonpitting. She says that she could not use the pumps for 2 days and then she has been using the last couple of days. It is not clear to me she has been able to get her stocking on. She has back problems. Mrs. Lukes has severe chronic venous insufficiency with secondary lymphedema. Her venous insufficiency is partially centrally mediated and that she is now post stent in the left common iliac vein. [facility] Thurner's syndrome/physiology]. She follows up with them on 10/15. She wears 20/30 below-knee stockings. She is supposed to use compression pumps at home although I think her compliance about with this is been less than 100%. I have asked her to use these 3 times a day. Finally I think she has lipodermatosclerosis in the left lower leg with an inverted  bottle sign. It is been a major problem controlling the edema in the left leg. The right leg we have had wounds on but not as significant a problem is on the left READMISSION 04/12/2021 Mrs. Wasser is a 76 year old woman we know well in this clinic. She has severe chronic venous insufficiency. 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 I believe she has had bilateral greater saphenous vein ablation in the past as well. She tells me that this wound opened sometime in March. She had a fall and thinks it was initially abrasion. She developed areas she describes as little blisters on the anterior part of her leg and she saw dermatology and was treated for methicillin staph aureus with several rounds of antibiotics. She has been using support stockings on the left leg and says this is the only thing she can get on. Her compression pump use maybe once a day she says if she did not use one she use the other. She comes in today with incredible swelling in the left leg with a wound on the left posterior calf. She has been using Neosporin to this previously a hydrocolloid. 6/15; patient arrives back for 1 week follow-up.Marland Kitchen Apparently her wrap fell down she did not call us to replace this. He has poor edema control. She only uses her compression pumps once a day 6/29; patient presents for 1 week follow-up. She has tolerated the compression wrap well. We have been using Iodoflex under the wrap. She has no issues or complaints today. 7/6; patient presents for 1 week follow-up. She states that the compression wrap rolled down her leg 4 days ago. She has been trying to keep the area covered but has no dressings at home to use. She denies signs of infection. 7/13; patient presents for 1 week follow-up. She states that her compression wrap rolled down her right leg and she called our office and had it placed a few days ago. She has been tolerating the current wrap well. She  states that the Iodoflex is causing a burning sensation. She denies signs of infection. 7/27; patient presents for 1 week follow-up She has tolerated the compression wrap well with Hydrofera Blue underneath. She has now developed a wound to her right lower extremity. She reports having a culture done To this area by her dermatologist that she reports is negative. She currently denies signs of infection. 8/30; patient presents for 1 week follow-up. On the left side she has tolerated the compression wrap well with Hydrofera Blue underneath. She reports some discomfort to her right lower extremity with the 3 layer compression. She currently denies signs of infection. 06/14/2021 upon evaluation today patient's wound actually appears to be doing decently well based on what I am seeing currently. She actually has 2 areas 1 on the left distal/posterior lower leg and the other on the right lower leg. Subsequently the measurements are roughly the same may be slightly smaller but in general have not made any trend towards getting worse which is great news. 06/23/2021 upon evaluation today patient appears to be doing pretty well in regard to her wounds. On the right she is having difficulty with sciatic pain and this subsequently has led to her taking the wrap off over the last week her leg is more swollen she is also been on prednisone for 10 days. With that being said I think we may want to just use a compression sock on the left that way she will not have to worry about the compression wrap being in place that she cannot get off. With that being said I do believe as well that the patient is going to need to continue with the wrapping on the left we will also need to do a little bit of sharp debridement today. 8/24; patient presents for 1 week follow-up. She has no issues  or complaints today. She denies signs of infection. 8/31; patient presents for 1 week follow-up. She has no issues or complaints today. She  has tolerated the compression wrap well on the left lower extremity. She denies signs of infection. 9/7; patient presents for follow-up. She reports that the compression wrap rolled down slightly on her right lower extremity. She reports tenderness to the wound bed. She denies increased warmth or erythema to the surrounding skin. She overall feels well. 9/14; patient presents for follow-up. She has no issues or complaints today. She denies signs of infection. PuraPly is available and patient would like to start this today. 9/21; patient presents for follow-up. She has no issues or complaints today. She tolerated the first skin substitute placement last week under compression. 9/28; patient presents for 1 week follow-up. She has no issues or complaints today. 10/5; patient presents for 1 week follow-up. She reports taking the wrap off yesterday. She was on her feet more yesterday and developed more swelling to her left lower extremity. She denies pain. Overall she is doing well. 10/12; patient presents for 1 week follow-up. She has no issues or complaints today. 10/19; patient presents for follow-up. She has no issues or complaints today. She tolerated the compression wrap well. She states she has compression stockings at home. [name], [name] ([id number]) Readmission: 11/27/2021 upon evaluation today patient appears to be doing poorly in regard to the wounds on her right leg. She also has an area of erythema in the proximal calf area of the right leg which again I do believe is a issue from the standpoint of it being warm and erythematous to touch and also seems to be somewhat localized I really think this may be more of a cellulitis issue than anything else. Fortunately I do not see any signs of active infection Systemically at this time. 12/05/2021 upon evaluation patient's wounds actually appear to be showing some signs of improvement which is not too bad and just 1 week's time. Especially  since we had to get on the right antibiotic which is only been for the past 4 days. Fortunately I do not see any signs of active infection locally nor systemically at this time that has gotten any worse. Overall I think that again with the antibiotic she is significantly improved and currently she is taking Levaquin. 12/11/2021 upon evaluation today patient appears to be doing well with regard to her leg. Between the antibiotics and the triamcinolone I feel like she is doing significantly better which is great news. Overall I am extremely pleased with where we stand. There does not appear to be any signs of active infection locally or systemically at this time. 12/18/2021 upon evaluation today patient appears to be doing well with regard to her wound. I do feel like this is showing signs of some improvement here. Little by little this is clearing up quite nicely. I think it is just a slow process but nonetheless 1 that we are seeing evidence of improvement with regard to. Electronic Signature(s) Signed: 12/18/2021 3:06:51 PM By: Worthy Keeler PA-C Entered By: Worthy Keeler on 12/18/2021 15:06:50 Seabolt, [name] ([id number]) -------------------------------------------------------------------------------- Physical Exam Details Patient Name: [name], [name]. Date of Service: 12/18/2021 2:15 PM Medical Record Number: [id number] Patient Account Number: [id number] Date of Birth/Sex: 01-08-1946 (76 y.o. F) Treating RN: Levora Dredge Primary Care Provider: [name] Other Clinician: Referring Provider: [name] Treating Provider/Extender: Jeri Cos Weeks in Treatment: 3 Constitutional [name] and well-hydrated in no acute distress.  Respiratory normal breathing without difficulty. Psychiatric this patient is able to make decisions and demonstrates good insight into disease process. Alert and Oriented x 3. pleasant and cooperative. Notes Upon inspection patient's wound  bed actually showed signs of good granulation and epithelization at this point. Fortunately I do not see any evidence of active infection locally or systemically which is great news and overall very pleased at this point. I do think that the steroid is helping I do not believe it is affecting her drowsiness. Electronic Signature(s) Signed: 12/18/2021 3:07:10 PM By: Worthy Keeler PA-C Entered By: Worthy Keeler on 12/18/2021 15:07:09 RANAY, KETTER ([id number]) -------------------------------------------------------------------------------- Physician Orders Details Patient Name: LETY, CULLENS. Date of Service: 12/18/2021 2:15 PM Medical Record Number: [id number] Patient Account Number: [id number] Date of Birth/Sex: 02/13/1946 (76 y.o. F) Treating RN: Levora Dredge Primary Care Provider: [name] Other Clinician: Referring Provider: [name] Treating Provider/Extender: [name] in Treatment: 3 Verbal / Phone Orders: No Diagnosis Coding ICD-10 Coding Code Description I89.0 Lymphedema, not elsewhere classified I87.331 Chronic venous hypertension (idiopathic) with ulcer and inflammation of right lower extremity L97.811 Non-pressure chronic ulcer of other part of right lower leg limited to breakdown of skin Follow-up Appointments o Return Appointment in 1 week. o Nurse Visit as needed Bathing/ Shower/ Hygiene o May shower with wound dressing protected with water repellent cover or cast protector. Edema Control - Lymphedema / Segmental Compressive Device / Other o Optional: One layer of unna paste to top of compression wrap (to act as an anchor). o Elevate, Exercise Daily and Avoid Standing for Long Periods of Time. o Elevate leg(s) parallel to the floor when sitting. o DO YOUR BEST to sleep in the bed at night. DO NOT sleep in your recliner. Long hours of sitting in a recliner leads to swelling of the legs and/or potential wounds on your  backside. Wound Treatment Wound #15 - Lower Leg Wound Laterality: Right, Circumferential Cleanser: Soap and Water Discharge Instructions: Gently cleanse wound with antibacterial soap, rinse and pat dry prior to dressing wounds Topical: Triamcinolone Acetonide Cream, 0.1%, 15 (g) tube Discharge Instructions: Apply as directed by provider. apply to wound and leg Primary Dressing: Xtrasorb Classic Super Absorbent Dressing, 6x9 (in/in) Discharge Instructions: over open areas of the leg that are weeping Compression Wrap: Profore Lite LF 3 Multilayer Compression Bandaging System Discharge Instructions: Apply 3 multi-layer wrap as prescribed. Electronic Signature(s) Signed: 12/18/2021 4:20:39 PM By: Levora Dredge Signed: 12/18/2021 5:38:13 PM By: Worthy Keeler PA-C Previous Signature: 12/18/2021 3:27:38 PM Version By: Worthy Keeler PA-C Entered By: Levora Dredge on 12/18/2021 16:13:45 Bellomo, [name] ([id number]) -------------------------------------------------------------------------------- Problem List Details Patient Name: [name], [name]. Date of Service: 12/18/2021 2:15 PM Medical Record Number: [id number] Patient Account Number: [id number] Date of Birth/Sex: 1945/12/04 (76 y.o. F) Treating RN: Levora Dredge Primary Care Provider: [name] Other Clinician: Referring Provider: [name] Treating Provider/Extender: [name] in Treatment: 3 Active Problems ICD-10 Encounter Code Description Active Date MDM Diagnosis I89.0 Lymphedema, not elsewhere classified 11/27/2021 No Yes I87.331 Chronic venous hypertension (idiopathic) with ulcer and inflammation of 11/27/2021 No Yes right lower extremity L97.811 Non-pressure chronic ulcer of other part of right lower leg limited to 11/27/2021 No Yes breakdown of skin Inactive Problems Resolved Problems Electronic Signature(s) Signed: 12/18/2021 2:03:16 PM By: Worthy Keeler PA-C Entered By: Worthy Keeler on  12/18/2021 14:03:16 Salmi, [name] ([id number]) -------------------------------------------------------------------------------- Progress Note Details Patient Name: [name], TISDELL. Date of Service: 12/18/2021 2:15  PM Medical Record Number: [id number] Patient Account Number: [id number] Date of Birth/Sex: 20-Dec-1945 (76 y.o. F) Treating RN: Levora Dredge Primary Care Provider: [name] Other Clinician: Referring Provider: [name] Treating Provider/Extender: [name] in Treatment: 3 Subjective Chief Complaint Information obtained from Patient Right LE Ulcer History of Present Illness (HPI) Pleasant 76 year old with history of chronic venous insufficiency. No diabetes or peripheral vascular disease. Left ABI 1.29. Questionable history of left lower extremity DVT. She developed a recurrent ulceration on her left lateral calf in December 2015, which she attributes to poor diet and subsequent lower extremity edema. She underwent endovenous laser ablation of her left greater saphenous vein in 2010. She underwent laser ablation of accessory branch of left GSV in April 2016 by Dr. Kellie Simmering at [facility]. She was previously wearing Unna boots, which she tolerated well. Tolerating 2 layer compression and cadexomer iodine. She returns to clinic for follow-up and is without new complaints. She denies any significant pain at this time. She reports persistent pain with pressure. No claudication or ischemic rest pain. No fever or chills. No drainage. READMISSION 11/13/16; this is a 76 year old woman who is not a diabetic. She is here for a review of a painful area on her left medial lower extremity. I note that she was seen here previously last year for wound I believe to be in the same area. At that time she had undergone previously a left greater saphenous vein ablation by Dr. Kellie Simmering and she had a ablation of the anterior accessory branch of the left greater saphenous vein in  March 2016. Seeing that the wound actually closed over. In reviewing the history with her today the ulcer in this area has been recurrent. She describes a biopsy of this area in 2009 that only showed stasis physiology. 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 Do not see that she has ever had arterial studies in the left leg. She tells me she has a follow-up with Dr. Kellie Simmering in roughly 10 days In any case she developed the reopening of this area roughly a month ago. On the background of this she describes rapidly increasing edema which has responded to Lasix 40 mg and metolazone 2.5 mg as well as the patient's lymph massage. She has been told she has both venous insufficiency and lymphedema but she cannot tolerate compression stockings 11/28/16; the patient saw Dr. Kellie Simmering recently. Per the patient he did arterial Dopplers in the office that did not show evidence of arterial insufficiency, per the patient he stated "treat this like an ordinary venous ulcer". She also saw her dermatologist Dr. Ronnald [name] who felt that this was more of a vascular ulcer. In general things are improving although she arrives today with increasing bilateral lower extremity edema with weeping a deeper fluid through the wound on the left medial leg compatible with some degree of lymphedema 12/04/16; the patient's wound is fully epithelialized but I don't think fully healed. We will do another week of depression with Promogran and TCA however I suspect we'll be able to discharge her next week. This is a very unusual-looking wound which was initially a figure-of-eight type wound lying on its side surrounded by petechial like hemorrhage. She has had venous ablation on this side. She apparently does not have  an  arterial issue per Dr. Kellie Simmering. She saw her dermatologist thought it was "vascular". Patient is definitely going to need ongoing compression and I talked about this with her today she will go to elastic therapy after she leaves here next week 12/11/16; the patient's wound is not completely closed today. She has surrounding scar tissue and in further discussion with the patient it would appear that she had ulcers in this area in 2009 for a prolonged period of time ultimately requiring a punch biopsy of this area that only showed venous insufficiency. I did not previously pickup on this part of the history from the patient. 12/18/16; the patient's wound is completely epithelialized. There is no open area here. She has significant bilateral venous insufficiency with secondary lymphedema to a mild-to-moderate degree she does not have compression stockings.. She did not say anything to me when I was in the room, she told our intake nurse that she was still having pain in this area. This isn't unusual recurrent small open area. She is going to go to elastic therapy to obtain compression stockings. 12/25/16; the patient's wound is fully epithelialized. There is no open area here. The patient describes some continued episodic discomfort in this area medial left calf. However everything looks fine and healed here. She is been to elastic therapy and caught herself 15-20 mmHg stockings, they apparently were having trouble getting 20-30 mm stockings in her size 01/22/17; this is a patient we discharged from the clinic a month ago. She has a recurrent open wound on her medial left calf. She had 15 mm support stockings. I told her I thought she needed 20-30 mm compression stockings. She tells me that she has been ill with hospitalization secondary to asthma and is been found to have severe hypokalemia likely secondary to a combination of Lasix and metolazone. This morning she noted blistering and leaking fluid on the  posterior part of her left leg. She called our intake nurse urgently and we was saw her this afternoon. She has not had any real discomfort here. I don't know that she's been wearing any stockings on this leg for at least 2-3 days. ABIs in this clinic were 1.21 on the right and 1.3 on the left. She is previously seen vascular surgery who does not think that there is a peripheral arterial issue. 01/30/17; Patient arrives with no open wound on the left leg. She has been to elastic therapy and obtained 20-70mmhg below knee stockings and she has one on the right leg today. READMISSION 02/19/18; this [place] is a now 76 year old patient we've had in this clinic perhaps 3 times before. I had last looked at her from January 07 December 2016 with an area on the medial left leg. We discharged her on 12/25/16 however she had to be readmitted on 01/22/17 with a recurrence. I have in my notes that we discharged her on 20-30 mm stockings although she tells me she was only wearing support hose because she cannot get stockings on predominantly related to her cervical spine surgery/issues. She has had previous ablations done by vein and vascular in [place] including a great saphenous vein ablation on the left with an anterior accessory branch ablation I think both of these were in 2016. On one of the previous visit she had a biopsy noted 2009 that was negative. She is not felt to have an arterial issue. She is not a diabetic. She does have a history of obstructive sleep apnea hypertension asthma as well as  chronic venous insufficiency and lymphedema. KENDALYNN, WIDEMAN ([id number]) On this occasion she noted 2 dry scaly patch on her left leg. She tried to put lotion on this it didn't really help. There were 2 open areas.the patient has been seeing her primary physician from 02/05/18 through 02/14/18. She had Unna boots applied. The superior wound now on the lateral left leg has closed but she's had one wound that remains  open on the lateral left leg. This is not the same spot as we dealt with in 2018. ABIs in this clinic were 1.3 bilaterally 02/26/18; patient has a small wound on the left lateral calf. Dimensions are down. She has chronic venous insufficiency and lymphedema. 03/05/18; small open area on the left lateral calf. Dimensions are down. Tightly adherent necrotic debris over the surface of the wound which was difficult to remove. Also the dressing [over collagen] stuck to the wound surface. This was removed with some difficulty as well. Change the primary dressing to Hydrofera Blue ready 03/12/18; small open area on the left lateral calf. Comes in with tightly adherent surface eschar as well as some adherent Hydrofera Blue. 03/19/18; open area on the left lateral calf. Again adherent surface eschar as well as some adherent Hydrofera Blue nonviable subcutaneous tissue. She complained of pain all week even with the reduction from 4-3 layer compression I put on last week. Also she had an increase in her ankle and calf measurements probably related to the same thing. 03/26/18; open area on the left lateral calf. A very small open area remains here. We used silver alginate starting last week as the Hydrofera Blue seem to stick to the wound bed. In using 4-layer compression 04/02/18; the open area in the left lateral calf at some adherent slough which I removed there is no open area here. We are able to transition her into her own compression stocking. Truthfully I think this is probably his support hose. However this does not maintain skin integrity will be limited. She cannot put over the toe compression stockings on because of neck problems hand problems etc. She is allergic to the lining layer of juxta lites. We might be forced to use extremitease stocking should this fail READMIT 11/24/2018 Patient is now a 76 year old woman who is not a diabetic. She has been in this clinic on at least 3 previous occasions largely  with recurrent wounds on her left leg secondary to chronic venous insufficiency with secondary lymphedema. Her situation is complicated by inability to get stockings on and an allergy to neoprene which is apparently a component and at least juxta lites and other stockings. As a result she really has not been wearing any stockings on her legs. She tells [place] that roughly 2 or 3 weeks ago she started noticing a stinging sensation just above her ankle on the left medial aspect. She has been diagnosed with pseudogout and she wondered whether this was what she was experiencing. She tried to dress this with something she bought at the store however subsequently it pulled skin off and now she has an open wound that is not improving. She has been using Vaseline gauze with a cover bandage. She saw her primary doctor last week who put an Haematologist on her. ABIs in this clinic was 1.03 on the left 2/12; the area is on the left medial ankle. Odd-looking wound with what looks to be surface epithelialization but a multitude of small petechial openings. This clearly not closed yet. We have been using  silver alginate under 3 layer compression with TCA 2/19; the wound area did not look quite as good this week. Necrotic debris over the majority of the wound surface which required debridement. She continues to have a multitude of what looked to be small petechial openings. She reminds [place] that she had a biopsy on this initially during her first outbreak in 2015 in [place] dermat[facility]. She expresses concern about this being a possible melanoma. 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 I assured her that this does not look anything like melanoma. Except for the petechial reaction it does look like a venous insufficiency area and she certainly has evidence of this on both sides 2/26; a difficult area on the left medial ankle. The  patient clearly has chronic venous hypertension with some degree of lymphedema. The odd thing about the area is the small petechial hemorrhages. I am not really sure how to explain this. This was present last time and this is not a compression injury. We have been using Hydrofera Blue which I changed to last week 3/4; still using Hydrofera Blue. Aggressive debridement today. She does not have known arterial issues. She has seen Dr. Kellie Simmering at [facility] vein [facility] vascular and and has an ablation on the left. [Anterior accessory branch of the greater saphenous]. From what I remember they did not feel she had an arterial issue. The patient has had this area biopsied in 2009 at [facility] dermatology and by her recollection they said this was "stasis". She is also follow-up with dermatology locally who thought that this was more of a vascular issue 3/11; using Hydrofera Blue. Aggressive debridement today. She does not have an arterial issue. We are using 3 layer compression although we may need to go to 4. The patient has been in for multiple changes to her wrap since I last saw her a week ago. She says that the area was leaking. I do not have too much more information on what was found 01/19/19 on evaluation today patient was actually being seen for a nurse visit when unfortunately she had the area on her left lateral lower extremity as well as weeping from the right lower extremity that became apparent. Therefore we did end up actually seeing her for a full visit with myself. She is having some pain at this site as well but fortunately nothing too significant at this point. No fevers, chills, nausea, or vomiting noted at this time. 3/18-Patient is back to the clinic with the left leg venous leg ulcer, the ulcer is larger in size, has a surface that is densely adherent with fibrinous tissue, the Hydrofera Blue was used but is densely adherent and there was difficulty in removing it. The right lower extremity  was also wrapped for weeping edema. Patient has a new area over the left lateral foot above the malleolus that is small and appears to have no debris with intact surrounding skin. Patient is on increased dose of Lasix also as a means to edema management 3/25; the patient has a nonhealing venous ulcer on the medial left leg and last week developed a smaller area on the lateral left calf. We have been using Hydrofera Blue with a contact layer. 4/1; no major change in these wounds areas. Left medial and more recently left lateral calf. I tried Iodoflex last week to aid in debridement she did not tolerate this. She stated her pain was terrible all  week. She took the top layer of the 4 layer compression off. 4/8; the patient actually looks somewhat better in terms of her more prominent left lateral calf wound. There is some healthy looking tissue here. She is still complaining of a lot of discomfort. 4/15; patient in a lot of pain secondary to sciatica. She is on a prednisone taper prescribed by her primary physician. She has the 2 areas one on the left medial and more recently a smaller area on the left lateral calf. Both of these just above the malleoli 4/22; her back pain is better but she still states she is very uncomfortable and now feels she is intolerant to the [employer]. No real change in the wounds we have been using Sorbact. She has been previously intolerant to Iodoflex. There is not a lot of option about what we can use to debride this wound under compression that she no doubt needs. sHe states Ultram no longer works for her pain 4/29; no major change in the wounds slightly increased depth. Surface on the original medial wound perhaps somewhat improved however the more recent area on the lateral left ankle is 100% covered in very adherent debris we have been using Sorbact. She tolerates 4 layer compression well and her edema control is a lot better. She has not had to come in for a nurse  check 5/6; no major change in the condition of the wounds. She did consent to debridement today which was done with some difficulty. Continuing Sorbact. She did not tolerate Iodoflex. She was in for a check of her compression the day after we wrapped her last week this was adjusted but nothing much was found 5/13; no major change in the condition or area of the wounds. I was able to get a fairly aggressive debridement done on the lateral left leg [place], [place] ([id number]) wound. Even using Sorbact under compression. She came back in on Friday to have the wrap changed. She says she felt uncomfortable on the lateral aspect of her ankle. She has a long history of chronic venous insufficiency including previous ablation surgery on this side. 5/20-Patient returns for wounds on left leg with both wounds covered in slough, with the lateral leg wound larger in size, she has been in 3 layer compression and felt more comfortable, she describes pain in ankle, in leg and pins and needles in foot, and is about to try Pamelor for this 6/3; wounds on the left lateral and left medial leg. The area medially which is the most recent of the 2 seems to have had the largest increase in dimensions. We have been using Sorbac to try and debride the surface. She has been to see orthopedics they apparently did a plain x-Amber that was indeterminant. Diagnosed her with neuropathy and they have ordered an MRI to determine if there is underlying osteomyelitis. This was not high on my thought list but I suppose it is prudent. We have advised her to make an appointment with vein and vascular in [place]. She has a history of a left greater saphenous and accessory vein ablations I wonder if there is anything else that can be done from a surgical point of view to help in these difficult refractory wounds. We have previously healed this wound on one occasion but it keeps on reopening [medial side] 6/10; deep tissue culture I did  last week I think on the left medial wound showed both moderate E. coli and moderate staph aureus [MSSA]. She is  going to require antibiotics and I have chosen Augmentin. We have been using Sorbact and we have made better looking wound surface on both sides but certainly no improvement in wound area. She was back in last Friday apparently for a dressing changes the wrap was hurting her outer left ankle. She has not managed to get a hold of vein and vascular in [place]. We are going to have to make her that appointment 6/17; patient is tolerating the Augmentin. She had an MRI that I think was ordered by orthopedic surgeon this did not show osteomyelitis or an abscess did suggest cellulitis. We have been using Sorbact to the lateral and medial ankles. We have been trying to arrange a follow-up appointment with vein and vascular in [place] or did her original ablations. We apparently an area sent the request to vein and vascular in [facility] 6/24; patient has completed the Augmentin. We do not yet have a vein and vascular appointment in [place]. I am not sure what the issue is here we have asked her to call tomorrow. We are using Sorbact. Making some improvements and especially the medial wound. Both surfaces however look better medial and lateral. 7/1; the patient has been in contact with vein and vascular in [place] but has not yet received an appointment. Using Sorbact we have gradually improve the wound surface with no improvement in surface area. She is approved for Apligraf but the wound surface still is not completely viable. She has not had to come in for a dressing change 7/8; the patient has an appointment with vein and vascular on 7/31 which is a Friday afternoon. She is concerned about getting back here for [place] to dress her wounds. I think it is important to have them goal for her venous reflux/history of ablations etc. to see if anything else can be done. She apparently tested  positive for 1 of the blood tests with regards to lupus and saw a rheumatologist. He has raised the issue of vasculitis again. I have had this thought in the past however the evidence seems overwhelming that this is a venous reflux etiology. If the rheumatologist tells me there is clinical and laboratory investigation is positive for lupus I will rethink this. 7/15; the patient's wound surfaces are quite a bit better. The medial area which was her original wound now has no depth although the lateral wound which was the more recent area actually appears larger. Both with viable surfaces which is indeed better. Using Sorbact. I wanted to use Apligraf on her however there is the issue of the vein and vascular appointment on 7/31 at 2:00 in the afternoon which would not allow her to get back to be rewrapped and they would no doubt remove the graft 7/22; the patient's wound surfaces have moderate amount of debris although generally look better. The lateral one is larger with 2 small satellite areas superiorly. We are waiting for her vein and vascular appointment on 7/31. She has been approved for Apligraf which I would like to use after th 7/29; wound surfaces have improved no debridement is required we have been using Sorbact. She sees vein and vascular on Friday with this so question of whether anything can be done to lessen the likelihood of recurrence and/or speed the healing of these areas. She is already had previous ablations. She no doubt has severe venous hypertension 8/5-Patient returns at 1 week, she was in [employer] for 3 days by her podiatrist, we have been using so backed  to the wound, she has increased pain in both the wounds on the left lower leg especially the more distal one on the lateral aspect 8/12-Patient returns at 1 week and she is agreeable to having debridement in both wounds on her left leg today. We have been using Sorbact, and vascular studies were reviewed at last  visit 8/19; the patient arrives with her wounds fairly clean and no debridement is required. We have used Sorbact which is really done a nice job in cleaning up these very difficult wound surfaces. The patient saw Dr. Donzetta Matters of vascular surgery on 7/31. He did not feel that there was an arterial component. He felt that her treated greater saphenous vein is adequately addressed and that the small saphenous vein did not appear to be involved significantly. She was also noted to have deep venous reflux which is not treatable. Dr. Donzetta Matters mentioned the possibility of a central obstructive component leading to reflux and he offered her central venography. She wanted to discuss this or think about it. I have urged her to go ahead with this. She has had recurrent difficult wounds in these areas which do heal but after months in the clinic. If there is anything that can be done to reduce the likelihood of this I think it is worth it. 9/2 she is still working towards getting follow-up with Dr. Donzetta Matters to schedule her CT. Things are quite a bit worse venography. I put Apligraf on 2 weeks ago on both wounds on the medial and lateral part of her left lower leg. She arrives in clinic today with 3 superficial additional wounds above the area laterally and one below the wound medially. She describes a lot of discomfort. I think these are probably wrapped injuries. Does not look like she has cellulitis. 07/20/2019 on evaluation today patient appears to be doing somewhat poorly in regard to her lower extremity ulcers. She in fact showed signs of erythema in fact we may even be dealing with an infection at this time. Unfortunately I am unsure if this is just infection or if indeed there may be some allergic reaction that occurred as a result of the Apligraf application. With that being said that would be unusual but nonetheless not impossible in this patient is one who is unfortunately allergic to quite a bit. Currently we have  been using the Sorbact which seems to do as well as anything for her. I do think we may want to obtain a culture today to see if there is anything showing up there that may need to be addressed. 9/16; noted that last week the wounds look worse in 1 week follow-up of the Apligraf. Using Sorbact as of 2 days ago. She arrives with copious amounts of drainage and new skin breakdown on the back of the left calf. The wounds arm more substantial bilaterally. There is a fair amount of swelling in the left calf no overt DVT there is edema present I think in the left greater than right thigh. She is supposed to go on 9/28 for CT venography. The wounds on the medial and lateral calf are worse and she has new skin breakdown posteriorly at least new for me. This is almost developing into a circumferential wound area The Apligraf was taken off last week which I agree with things are not going in the right direction a culture was done we do not have that back yet. She is on Augmentin that she started 2 days ago 9/23; dressing was changed  by her nurses on Monday. In general there is no improvement in the wound areas although the area looks less angry than last week. She did get Augmentin for MSSA cultured on the 14th. She still appears to have too much swelling in the left leg even with 3 layer compression 9/30; the patient underwent her procedure on 9/28 by Dr. Donzetta Matters at vascular and vein specialist. She was discovered to have the common iliac vein measuring 12.2 mm but at the level of L4-L5 measured 3 mm. After stenting it measured 10 mm. 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 Rouleaux flow in the common femoral and femoral vein was observed much improved after stenting. We are using silver alginate to the wounds on the medial and lateral ankle on the left. 4 layer compression 10/7; the patient had fluid swelling around her knee and 4 layer compression. At the advice of vein and vascular this was  reduced to 3 layer which she is tolerating better. We have been using silver alginate under 3 layer compression since last Friday LAVENE, PENAGOS ([id number]) 10/14; arrives with the areas on the left ankle looking a lot better. Inflammation in the area also a lot better. She came in for a nurse check on 10/9 10/21; continued nice improvement. Slight improvements in surface area of both the medial and lateral wounds on the left. A lot of the satellite lesions in the weeping erythema around these from stasis dermatitis is resolved. We have been using silver alginate 10/28; general improvement in the entire wound areas although not a lot of change in dimensions the wound certainly looks better. There is a lot less in terms of venous inflammation. Continue silver alginate this week however look towards Hydrofera Blue next week 11/4; very adherent debris on the medial wound left wound is not as bad. We have been using silver alginate. Change to [facility] today 11/11; very adherent debris on both wound areas. She went to vein and vascular last week and follow-up they put in [employer] boot on this today. He says the [facility] was adherent. Wound is definitely not as good as last week. Especially on the left there the satellite lesions look more prominent 11/18; absolutely no better. erythema on lateral aspect with tenderness. 09/30/2019 on evaluation today patient appears to actually be doing better. Dr. Dellia Nims did put her on doxycycline last week which I do believe has helped her at this point. Fortunately there is no signs of active infection at this time. No fevers, chills, nausea, vomiting, or diarrhea. I do believe he may want extend the doxycycline for 7 additional days just to ensure everything does completely cleared up the patient is in agreement with that plan. Otherwise she is going require some sharp debridement today 12/2; patient is completing a 2-week course of doxycycline. I gave her  this empirically for inflammation as well as infection when I last saw her 2 weeks ago. All of this seems to be better. She is using silver alginate she has the area on the medial aspect of the larger area laterally and the 2 small satellite regions laterally above the major wound. 12/9; the patient's wound on the left medial and left lateral calf look really quite good. We have been using silver alginate. She saw vein and vascular in follow-up on 10/09/2019. She has had a previous left greater saphenous vein ablation by Dr. Oscar La in 2016. More recently she underwent a left common iliac vein stent by Dr.  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 The swelling is improved and certainly the wounds have improved. The patient shows [place] today area on the right medial calf there is almost no wound but leaking lymphedema. She says she start this started 3 or 4 days ago. She did not traumatize it. It is not painful. She does not wear compression on that side 12/16; the patient continues to do well laterally. Medially still requiring debridement. The area on the right calf did not materialize to anything and is not currently open. We wrapped this last time. She has support stockings for that leg although I am not sure they are going to provide adequate compression 12/23; the lateral wound looks stable. Medially still requiring debridement for tightly adherent fibrinous debris. We've been using silver alginate. Surface area not any different 12/30; neither wound is any better with regards to surface and the area on the left lateral is larger. I been using silver alginate to the left lateral which look quite good last week and Sorbact to the left medial 11/11/2019. Lateral wound area actually looks better and somewhat smaller. Medial still requires a very aggressive debridement today. We have been using Sorbact on both wound areas 1/13; not much better still adherent debris bilaterally. I been using  Sorbact. She has severe venous hypertension. Probably some degree of dermal fibrosis distally. I wonder whether tighter compression might help and I am going to try that today. We also need to work on the bioburden 1/20; using Sorbact. She has severe venous hypertension status post stent placement for pelvic vein compression. We applied gentamicin last time to see if we could reduce bioburden I had some discussion with her today about the use of pentoxifylline. This is occasionally used in this setting for wounds with refractory venous insufficiency. However this interacts with Plavix. She tells me that she was put on this after stent placement for 3 months. She will call Dr. Claretha Cooper office to discuss 1/27; we are using gentamicin under Sorbact. 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 She has a stent. Wound medially is measuring smaller this week. Laterally measuring slightly larger although she has some satellite lesions superiorly 2/3; gentamicin under Sorbact under 4-layer compression. 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 She has a stent on Plavix. Her wounds are measuring smaller this week. More substantially laterally where there is a satellite lesion superiorly. 2/10; gentamicin under Sorbac. 4-layer compression. Patient communicated with Dr. Donzetta Matters at vein and vascular in [place]. He is okay with the patient coming off Plavix I will therefore start her on pentoxifylline for a 1 month trial. In general her wounds look better today. I had some concerns about swelling in the left thigh however she measures 61.5 on the right and 63 on the mid thigh which does not suggest there is any difficulty. The patient is not describing any pain. 2/17; gentamicin under Sorbac 4-layer compression. She has been on pentoxifylline for 1 week and complains of loose stool. No nausea she is eating and drinking well 2/24; the patient apparently came in 2  days ago for a nurse visit when her wrap fell down. Both areas look a little worse this week macerated medially and satellite lesions laterally. Change to silver alginate today 3/3; wounds are larger today especially medially. She also has more swelling in her foot lower leg and I even noted some swelling in her posterior thigh which is tender. I wonder about the patency of  her stent. Fortuitously she sees Dr. Claretha Cooper group on Friday 3/10; Mrs. Eskridge was seen by vein and vascular on 3/5. The patient underwent ultrasound. There was no evidence of thrombosis involving the IVC no evidence of thrombosis involving the right common iliac vein there is no evidence of thrombosis involving the right external iliac vein the left external vein is also patent. The right common iliac vein stent appears patent bilateral common femoral veins are compressible and appear patent. I was concerned about the left common iliac stent however it looks like this is functional. She has some edema in the posterior thigh that was tender she still has that this week. I also note they had trouble finding the pulses in her left foot and booked her for an ABI baseline in 4 weeks. She will follow up in 6 months for repeat IVC duplex. The patient stopped the pentoxifylline because of diarrhea. It does not look like that was being effective in any case. I have advised her to go back on her aspirin 81 mg tablet, vascular it also suggested this 3/17; comes in today with her wound surfaces a lot better. The excoriations from last week considerably better probably secondary to the TCA. We have been using silver alginate 3/24; comes in today with smaller wounds both medially and laterally. Both required debridement. There are 2 small satellite areas superiorly laterally. She also has a very odd bandlike area in the mid calf almost looking like there was a weakness in the wrap in a localized area. I would write this off as being this  however anteriorly she has a small raised ballotable area that is very tender almost reminiscent of an abscess but there was no obvious purulent surface to it. YOUNG, [name] ([id number]) 02/04/20 upon evaluation today patient appears to be doing fairly well in regard to her wounds today. Fortunately there is no signs of active infection at this time. No fevers, chills, nausea, vomiting, or diarrhea. She has been tolerating the dressing changes without complication. Fortunately I feel like she is showing signs of improvement although has been sometime since have seen her. Nonetheless the area of concern that Dr. Dellia Nims had last week where she had possibly an area of the wrap that was we can allow the leg to bulge appears to be doing significantly better today there is no signs of anything worsening. 4/7; the patient's wounds on her medial and lateral left leg continue to contract. We have been using a regular alginate. Last week she developed an area on the right medial lower leg which is probably a venous ulcer as well. 4/14; the wounds on her left medial and lateral lower leg continue to contract. Surface eschar. We have been using regular alginate. The area on the right medial lower leg is closed. We have been putting both legs under 4-layer contraction. The patient went back to see vein and vascular she had arterial studies done which were apparently "quite good" per the patient although I have not read their notes I have never felt she had an arterial issue. 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 This is been longstanding and refractory to exercise, leg elevation and longstanding use of compression wraps in our clinic as well as compression stockings on the times we have been able to get these to heal 4/21; we thought she actually might be close this week however she arrives in clinic with a lot of edema in her upper left calf and  into  her posterior thigh. This is been an intermittent problem here. She says the wrap fell down but it was replaced with a nurse visit on Monday. We are using calcium alginate to the wounds and the wound sizes there not terribly larger than last week but there is a lot more edema 4/28; again wound edges are smaller on both sides. Her edema is better controlled than last time. She is obtained her compression pumps from [employer] although they have not been to her home to set these up. 5/5; left medial and left lateral both look stable. I am not sure the medial is any smaller. We have been using calcium alginate under 4-layer compression. She had an area on the right medial. This was eschared today. We have been wrapping this as well. She does not tolerate external compression stockings due to a history of various contact allergies. She has her compression pumps however the representative from the company is coming on her to show her how to use these tomorrow 5/19; patient with severe chronic venous insufficiency secondary to central venous disease. She had a stent placed in her left common iliac vein. She has done better since but still difficult to control wounds. She comes in today with nothing open on the right leg. Her areas on the left medial and left lateral are just about closed. We are using calcium alginate under 4-layer compression. She is using her external compression pumps at home She only has 15-20 support stockings. States she cannot get anything tighter than that on. 03/30/20-Patient returns at 1 week, the wounds on the left leg are both slightly bigger, the last week she was on 3 layer compression which started to slide down. She is starting to use her lymphedema pumps although she stated on 1 day her right ankle started to swell up and she have to stop that day. Unfortunately the open area seem to oscillate between improving to the point of healing and then flaring up all to do  with effectiveness of compression or lack of due to the left leg topography not keeping the compression wraps from rolling down 6/2 patient comes in with a 15/20 mmHg stocking on the right leg. She tells me that she developed a lot of swelling in her ankles she saw orthopedics she was felt to possibly be having a flare of pseudogout versus some other type of arthritis. She was put on steroids for a respiratory issue so that helps with the inflammation. She has not been using the pumps all week. She thinks the left thigh is more swollen than usual and I would agree with that. She has an appointment with Dr. Donzetta Matters 9 days or so from now 6/9; both wounds on the left medial and left lateral are smaller. We have been using calcium alginate under compression. She does not have an open wound on the right leg she is using a stocking and her compression pumps things are going well. She has an appointment with Dr. Donzetta Matters with regards to her stent in the left common iliac vein 6/16; the wounds on the left medial and left lateral ankle continues to contract. The patient saw Dr. Donzetta Matters and I think he seems satisfied. Ordered follow-up venous reflux studies on both sides in September. Cautioned that she may need thigh-high stockings. She has been using calcium alginate under compression on the left and her own stocking on the right leg. She tells [place] there are no open wounds on the right 6/23;  left lateral is just about closed. Medial required debridement today. We have been using calcium alginate. Extensive discussion about the compression pumps she is only using these on 25 mmHg states she could not take 40 or 30 when the wrap came out to her home to demonstrate these. He said they should not feel tight 6/30; the left lateral wound has a slight amount of eschar. . The area medially is about the same using Hydrofera Blue. 7/7; left lateral wound still has some eschar. I will remove this next week may be closed. The area  medially is very small using Hydrofera Blue with improvement. Unfortunately the stockings fell down. Unfortunately the blisters have developed at the edge of where the wrap fell. When this happened she says her legs hurt she did not use her pumps. We are not open Monday for her to come in and change the wraps and she had an appointment yesterday. She also tells me that she is going to have an MRI of her back. She is having pain radiating into her left anterior leg she thinks her from an L5 disc. She saw Dr. Ellene Route of neurosurgery 7/14; the area on the left lateral ankle area is closed. Still a small area medially however it looks better as well. We have been using Hydrofera Blue under 4-layer compression 7/21; left lateral ankle is still closed however her wound on the medial left calf is actually larger. This is probably because Hydrofera Blue got stuck to the wound. She came in for a nurse change on Friday and will do that again this week I was concerned about the amount of swelling that she had last week however she is using her compression pumps twice a day and the swelling seems well controlled 7/28; remaining wound on the left medial lower leg is smaller. We have been using moistened silver collagen under compression she is coming back for a nurse visit. For reasons that were not really clear she was just keeping her legs elevated and not using her compression pumps. I have asked her to use the compression pumps. She does not have any wounds on the right leg 06/15/20-Patient returns at 2 weeks, her LLE edema is worse and she developed a blister wound that is new and has bigger posterior calf wound on right, we are using Prisma with pad, 4 layer compression. she has been on lasix 40 mg daily 8/18; patient arrives today with things a lot worse than I remember from a few weeks ago. She was seen last week. Noted that her edema was worse and that she now had a left lateral wound as well as  deteriorating edema in the medial and posterior part of the lower leg. She says she is using his or her external compression pumps once a day although I wonder about the compliance. 8/25; weeping area on the right medial lower leg. This had actually gotten a small localized area of her compression stocking wet. On the left side there is a large denuded area on the posterior medial lower leg and smaller area on the lateral. This was not the original areas that we dealt with. 9/1 the patient's wound on the left leg include the left lateral and left posterior. Larger superficial wounds weeping. She has very poor edema control. Tender localized edema in the left lower medial ankle/heel probably because of localized wrap issues. She freely admits she is not using the compression pumps. She has been up on her feet a lot. She  thinks the hydrofera blue is contributing to the pain she is experiencing.. This is a complaint that I have occasionally heard [name], [name] ([id number]) 9/8; really not much improvement. The patient is still complaining of a lot of pain particularly when she uses compression pumps. I switched her to silver alginate last time because she found the Hydrofera Blue to be irritating. I don't hear much difference in her description with the silver alginate. 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 She has follow-up with Dr. Donzetta Matters I think for ultrasound next month 9/15; quite a bit of improvement today. We have less edema and more epithelialization in both of her wound areas on the left medial and left lateral calf. These are not the site of her original wounds in this area. She says she has been using her compression pumps for 30 minutes twice a day, there pain issues that never quite understood. Silver alginate as the primary dressing 9/22; continued improvement. Both areas medially and laterally still have  a small open area there is some eschar. She continues to complain of left medial ankle pain. Swelling in the leg is in much better condition. We have been using silver alginate 9/29; continued improvement. Both areas medially and laterally in the left calf look as though they are close some minor surface eschar but I think this is epithelialized. She comes in today saying she has a ruptured disc at L4-L5 cannot bend over to put on her stockings. 10/6; patient comes in today with no open wounds on either leg. However her edema on the left leg in the upper one third of the lower leg is poorly controlled nonpitting. She says that she could not use the pumps for 2 days and then she has been using the last couple of days. It is not clear to me she has been able to get her stocking on. She has back problems. Mrs. Helmkamp has severe chronic venous insufficiency with secondary lymphedema. Her venous insufficiency is partially centrally mediated and that she is now post stent in the left common iliac vein. [facility] Thurner's syndrome/physiology]. She follows up with them on 10/15. She wears 20/30 below-knee stockings. She is supposed to use compression pumps at home although I think her compliance about with this is been less than 100%. I have asked her to use these 3 times a day. Finally I think she has lipodermatosclerosis in the left lower leg with an inverted bottle sign. It is been a major problem controlling the edema in the left leg. The right leg we have had wounds on but not as significant a problem is on the left READMISSION 04/12/2021 Mrs. Pinho is a 76 year old woman we know well in this clinic. She has severe chronic venous insufficiency. 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 I believe she has had bilateral greater saphenous vein ablation in the past as well. She tells me that this wound opened sometime in March. She had a fall and thinks it was initially  abrasion. She developed areas she describes as little blisters on the anterior part of her leg and she saw dermatology and was treated for methicillin staph aureus with several rounds of antibiotics. She has been using support stockings on the left leg and says this is the only thing she can get on. Her compression pump use maybe once a day  she says if she did not use one she use the other. She comes in today with incredible swelling in the left leg with a wound on the left posterior calf. She has been using Neosporin to this previously a hydrocolloid. 6/15; patient arrives back for 1 week follow-up.Marland Kitchen Apparently her wrap fell down she did not call us to replace this. He has poor edema control. She only uses her compression pumps once a day 6/29; patient presents for 1 week follow-up. She has tolerated the compression wrap well. We have been using Iodoflex under the wrap. She has no issues or complaints today. 7/6; patient presents for 1 week follow-up. She states that the compression wrap rolled down her leg 4 days ago. She has been trying to keep the area covered but has no dressings at home to use. She denies signs of infection. 7/13; patient presents for 1 week follow-up. She states that her compression wrap rolled down her right leg and she called our office and had it placed a few days ago. She has been tolerating the current wrap well. She states that the Iodoflex is causing a burning sensation. She denies signs of infection. 7/27; patient presents for 1 week follow-up She has tolerated the compression wrap well with Hydrofera Blue underneath. She has now developed a wound to her right lower extremity. She reports having a culture done To this area by her dermatologist that she reports is negative. She currently denies signs of infection. 8/30; patient presents for 1 week follow-up. On the left side she has tolerated the compression wrap well with Hydrofera Blue underneath. She reports some  discomfort to her right lower extremity with the 3 layer compression. She currently denies signs of infection. 06/14/2021 upon evaluation today patient's wound actually appears to be doing decently well based on what I am seeing currently. She actually has 2 areas 1 on the left distal/posterior lower leg and the other on the right lower leg. Subsequently the measurements are roughly the same may be slightly smaller but in general have not made any trend towards getting worse which is great news. 06/23/2021 upon evaluation today patient appears to be doing pretty well in regard to her wounds. On the right she is having difficulty with sciatic pain and this subsequently has led to her taking the wrap off over the last week her leg is more swollen she is also been on prednisone for 10 days. With that being said I think we may want to just use a compression sock on the left that way she will not have to worry about the compression wrap being in place that she cannot get off. With that being said I do believe as well that the patient is going to need to continue with the wrapping on the left we will also need to do a little bit of sharp debridement today. 8/24; patient presents for 1 week follow-up. She has no issues or complaints today. She denies signs of infection. 8/31; patient presents for 1 week follow-up. She has no issues or complaints today. She has tolerated the compression wrap well on the left lower extremity. She denies signs of infection. 9/7; patient presents for follow-up. She reports that the compression wrap rolled down slightly on her right lower extremity. She reports tenderness to the wound bed. She denies increased warmth or erythema to the surrounding skin. She overall feels well. 9/14; patient presents for follow-up. She has no issues or complaints today. She denies signs of infection. PuraPly  is available and patient would like to start this today. 9/21; patient presents for  follow-up. She has no issues or complaints today. She tolerated the first skin substitute placement last week under compression. 9/28; patient presents for 1 week follow-up. She has no issues or complaints today. 10/5; patient presents for 1 week follow-up. She reports taking the wrap off yesterday. She was on her feet more yesterday and developed more swelling to her left lower extremity. She denies pain. Overall she is doing well. MILANY, GECK ([id number]) 10/12; patient presents for 1 week follow-up. She has no issues or complaints today. 10/19; patient presents for follow-up. She has no issues or complaints today. She tolerated the compression wrap well. She states she has compression stockings at home. Readmission: 11/27/2021 upon evaluation today patient appears to be doing poorly in regard to the wounds on her right leg. She also has an area of erythema in the proximal calf area of the right leg which again I do believe is a issue from the standpoint of it being warm and erythematous to touch and also seems to be somewhat localized I really think this may be more of a cellulitis issue than anything else. Fortunately I do not see any signs of active infection Systemically at this time. 12/05/2021 upon evaluation patient's wounds actually appear to be showing some signs of improvement which is not too bad and just 1 week's time. Especially since we had to get on the right antibiotic which is only been for the past 4 days. Fortunately I do not see any signs of active infection locally nor systemically at this time that has gotten any worse. Overall I think that again with the antibiotic she is significantly improved and currently she is taking Levaquin. 12/11/2021 upon evaluation today patient appears to be doing well with regard to her leg. Between the antibiotics and the triamcinolone I feel like she is doing significantly better which is great news. Overall I am extremely pleased with where  we stand. There does not appear to be any signs of active infection locally or systemically at this time. 12/18/2021 upon evaluation today patient appears to be doing well with regard to her wound. I do feel like this is showing signs of some improvement here. Little by little this is clearing up quite nicely. I think it is just a slow process but nonetheless 1 that we are seeing evidence of improvement with regard to. Objective Constitutional [name] and well-hydrated in no acute distress. Vitals Time Taken: 2:16 PM, Height: 62 in, Weight: 199 lbs, BMI: 36.4, Temperature: 98 F, Pulse: 55 bpm, Respiratory Rate: 18 breaths/min, Blood Pressure: 163/79 mmHg. Respiratory normal breathing without difficulty. Psychiatric this patient is able to make decisions and demonstrates good insight into disease process. Alert and Oriented x 3. pleasant and cooperative. General Notes: Upon inspection patient's wound bed actually showed signs of good granulation and epithelization at this point. Fortunately I do not see any evidence of active infection locally or systemically which is great news and overall very pleased at this point. I do think that the steroid is helping I do not believe it is affecting her drowsiness. Integumentary (Hair, Skin) Wound #15 status is Open. Original cause of wound was Gradually Appeared. The date acquired was: 10/27/2021. The wound has been in treatment 3 weeks. The wound is located on the Right,Circumferential Lower Leg. The wound measures 7cm length x 14.5cm width x 0.1cm depth; 79.718cm^2 area and 7.972cm^3 volume. There is Fat Layer (Subcutaneous  Tissue) exposed. There is no tunneling or undermining noted. There is a large amount of serosanguineous drainage noted. The wound margin is indistinct and nonvisible. There is medium (34-66%) pink, pale granulation within the wound bed. There is a medium (34-66%) amount of necrotic tissue within the wound bed including  Adherent Slough. Assessment Active Problems ICD-10 Lymphedema, not elsewhere classified Chronic venous hypertension (idiopathic) with ulcer and inflammation of right lower extremity Non-pressure chronic ulcer of other part of right lower leg limited to breakdown of skin HENRETTER, [name]. ([id number]) Plan 1. I would recommend currently that we go ahead and continue with the triamcinolone followed by the XtraSorb which I think is doing a good job. 2. I am also can recommend that we continue with the 3 layer compression wrap which I think is doing awesome for her. We will see patient back for reevaluation in 1 week here in the clinic. If anything worsens or changes patient will contact our office for additional recommendations. Electronic Signature(s) Signed: 12/18/2021 3:07:32 PM By: Worthy Keeler PA-C Entered By: Worthy Keeler on 12/18/2021 15:07:32 Landrigan, [name] ([id number]) -------------------------------------------------------------------------------- SuperBill Details Patient Name: MATA, ROWEN. Date of Service: 12/18/2021 Medical Record Number: [id number] Patient Account Number: [id number] Date of Birth/Sex: [birth year] (76 y.o. F) Treating RN: Levora Dredge Primary Care Provider: [name] Other Clinician: Referring Provider: [name] Treating Provider/Extender: [name] in Treatment: 3 Diagnosis Coding ICD-10 Codes Code Description I89.0 Lymphedema, not elsewhere classified I87.331 Chronic venous hypertension (idiopathic) with ulcer and inflammation of right lower extremity L97.811 Non-pressure chronic ulcer of other part of right lower leg limited to breakdown of skin Facility Procedures CPT4 Code: [id number] Description: (Facility Use Only) [phone] - [facility] RT LEG Modifier: Quantity: 1 Physician Procedures CPT4 Code Description: [id number] [place] - WC PHYS LEVEL 4 - EST PT Modifier: Quantity: 1 CPT4 Code Description:  ICD-10 Diagnosis Description I89.0 Lymphedema, not elsewhere classified I87.331 Chronic venous hypertension (idiopathic) with ulcer and inflammation of rig L97.811 Non-pressure chronic ulcer of other part of right lower leg limited  to brea Modifier: ht lower extremit kdown of skin Quantity: y Engineer, maintenance) Signed: 12/18/2021 4:14:09 PM By: Levora Dredge Signed: 12/18/2021 5:38:13 PM By: Worthy Keeler PA-C Previous Signature: 12/18/2021 3:07:47 PM Version By: Worthy Keeler PA-C Entered By: Levora Dredge on 12/18/2021 16:14:08

## 2021-12-25 ENCOUNTER — Encounter: Payer: Medicare Other | Admitting: Physician Assistant

## 2021-12-25 ENCOUNTER — Other Ambulatory Visit: Payer: Self-pay

## 2021-12-25 DIAGNOSIS — I890 Lymphedema, not elsewhere classified: Secondary | ICD-10-CM | POA: Diagnosis not present

## 2021-12-25 DIAGNOSIS — L97212 Non-pressure chronic ulcer of right calf with fat layer exposed: Secondary | ICD-10-CM | POA: Diagnosis not present

## 2021-12-25 DIAGNOSIS — I872 Venous insufficiency (chronic) (peripheral): Secondary | ICD-10-CM | POA: Diagnosis not present

## 2021-12-25 DIAGNOSIS — J45909 Unspecified asthma, uncomplicated: Secondary | ICD-10-CM | POA: Diagnosis not present

## 2021-12-25 DIAGNOSIS — L97811 Non-pressure chronic ulcer of other part of right lower leg limited to breakdown of skin: Secondary | ICD-10-CM | POA: Diagnosis not present

## 2021-12-25 DIAGNOSIS — I87331 Chronic venous hypertension (idiopathic) with ulcer and inflammation of right lower extremity: Secondary | ICD-10-CM | POA: Diagnosis not present

## 2021-12-25 DIAGNOSIS — I10 Essential (primary) hypertension: Secondary | ICD-10-CM | POA: Diagnosis not present

## 2021-12-25 NOTE — Progress Notes (Addendum)
[name] ([id number]) Visit Report for 12/25/2021 Chief Complaint Document Details Patient Name: [name], [name]. Date of Service: 12/25/2021 2:15 PM Medical Record Number: [id number] Patient Account Number: [id number] Date of Birth/Sex: 1946/08/03 (76 y.o. F) Treating RN: Levora Dredge Primary Care Provider: [name] Other Clinician: Referring Provider: [name] Treating Provider/Extender: [name] in Treatment: 4 Information Obtained from: Patient Chief Complaint Right LE Ulcer Electronic Signature(s) Signed: 12/25/2021 2:12:12 PM By: Worthy Keeler PA-C Entered By: Worthy Keeler on 12/25/2021 14:12:12 SANTOS, HARDWICK ([id number]) -------------------------------------------------------------------------------- HPI Details Patient Name: [name], [name]. Date of Service: 12/25/2021 2:15 PM Medical Record Number: [id number] Patient Account Number: [id number] Date of Birth/Sex: [birth year] (76 y.o. F) Treating RN: Levora Dredge Primary Care Provider: [name] Other Clinician: Referring Provider: [name] Treating Provider/Extender: [name] in Treatment: 4 History of Present Illness HPI Description: Pleasant 76 year old with history of chronic venous insufficiency. No diabetes or peripheral vascular disease. Left ABI 1.29. Questionable history of left lower extremity DVT. She developed a recurrent ulceration on her left lateral calf in December 2015, which she attributes to poor diet and subsequent lower extremity edema. She underwent endovenous laser ablation of her left greater saphenous vein in 2010. She underwent laser ablation of accessory branch of left GSV in April 2016 by Dr. Kellie Simmering at [facility]. She was previously wearing Unna boots, which she tolerated well. Tolerating 2 layer compression and cadexomer iodine. She returns to clinic for follow-up and is without new complaints. She denies any significant pain at this  time. She reports persistent pain with pressure. No claudication or ischemic rest pain. No fever or chills. No drainage. READMISSION 11/13/16; this is a 76 year old woman who is not a diabetic. She is here for a review of a painful area on her left medial lower extremity. I note that she was seen here previously last year for wound I believe to be in the same area. At that time she had undergone previously a left greater saphenous vein ablation by Dr. Kellie Simmering and she had a ablation of the anterior accessory branch of the left greater saphenous vein in March 2016. Seeing that the wound actually closed over. In reviewing the history with her today the ulcer in this area has been recurrent. She describes a biopsy of this area in 2009 that only showed stasis physiology. She also has a history of today malignant melanoma in the right shoulder for which she follows with Dr. 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 Do not see that she has ever had arterial studies in the left leg. She tells me she has a follow-up with Dr. Kellie Simmering in roughly 10 days In any case she developed the reopening of this area roughly a month ago. On the background of this she describes rapidly increasing edema which has responded to Lasix 40 mg and metolazone 2.5 mg as well as the patient's lymph massage. She has been told she has both venous insufficiency and lymphedema but she cannot tolerate compression stockings 11/28/16; the patient saw Dr. Kellie Simmering recently. Per the patient he did arterial Dopplers in the office that did not show evidence of arterial insufficiency, per the patient he stated "treat this like an ordinary venous ulcer". She also saw her dermatologist Dr. Ronnald Ramp who felt that this was more of a vascular ulcer. In general things are improving although she  arrives today with increasing bilateral lower  extremity edema with weeping a deeper fluid through the wound on the left medial leg compatible with some degree of lymphedema 12/04/16; the patient's wound is fully epithelialized but I don't think fully healed. We will do another week of depression with Promogran and TCA however I suspect we'll be able to discharge her next week. This is a very unusual-looking wound which was initially a figure-of-eight type wound lying on its side surrounded by petechial like hemorrhage. She has had venous ablation on this side. She apparently does not have an arterial issue per Dr. Kellie Simmering. She saw her dermatologist thought it was "vascular". Patient is definitely going to need ongoing compression and I talked about this with her today she will go to elastic therapy after she leaves here next week 12/11/16; the patient's wound is not completely closed today. She has surrounding scar tissue and in further discussion with the patient it would appear that she had ulcers in this area in 2009 for a prolonged period of time ultimately requiring a punch biopsy of this area that only showed venous insufficiency. I did not previously pickup on this part of the history from the patient. 12/18/16; the patient's wound is completely epithelialized. There is no open area here. She has significant bilateral venous insufficiency with secondary lymphedema to a mild-to-moderate degree she does not have compression stockings.. She did not say anything to me when I was in the room, she told our intake nurse that she was still having pain in this area. This isn't unusual recurrent small open area. She is going to go to elastic therapy to obtain compression stockings. 12/25/16; the patient's wound is fully epithelialized. There is no open area here. The patient describes some continued episodic discomfort in this area medial left calf. However everything looks fine and healed here. She is been to elastic therapy and caught herself 15-20 mmHg  stockings, they apparently were having trouble getting 20-30 mm stockings in her size 01/22/17; this is a patient we discharged from the clinic a month ago. She has a recurrent open wound on her medial left calf. She had 15 mm support stockings. I told her I thought she needed 20-30 mm compression stockings. She tells me that she has been ill with hospitalization secondary to asthma and is been found to have severe hypokalemia likely secondary to a combination of Lasix and metolazone. This morning she noted blistering and leaking fluid on the posterior part of her left leg. She called our intake nurse urgently and we was saw her this afternoon. She has not had any real discomfort here. I don't know that she's been wearing any stockings on this leg for at least 2-3 days. ABIs in this clinic were 1.21 on the right and 1.3 on the left. She is previously seen vascular surgery who does not think that there is a peripheral arterial issue. 01/30/17; Patient arrives with no open wound on the left leg. She has been to elastic therapy and obtained 20-2mhg below knee stockings and she has one on the right leg today. READMISSION 02/19/18; this PMachois a now 76year old patient we've had in this clinic perhaps 3 times before. I had last looked at her from January 07 December 2016 with an area on the medial left leg. We discharged her on 12/25/16 however she had to be readmitted on 01/22/17 with a recurrence. I have in my notes that we discharged her on 20-30 mm stockings although she tells me  she was only wearing support hose because she cannot get stockings on predominantly related to her cervical spine surgery/issues. She has had previous ablations done by vein and vascular in [place] including a great saphenous vein ablation on the left with an anterior accessory branch ablation I think both of these were in 2016. On one of the previous visit she had a biopsy noted 2009 that was negative. She is not felt to  have an arterial issue. She is not a diabetic. She does have a history of obstructive sleep apnea hypertension asthma as well as chronic venous insufficiency and lymphedema. On this occasion she noted 2 dry scaly patch on her left leg. She tried to put lotion on this it didn't really help. There were 2 open areas.the patient has been seeing her primary physician from 02/05/18 through 02/14/18. She had Unna boots applied. The superior wound now on the lateral left leg has closed but she's had one wound that remains open on the lateral left leg. This is not the same spot as we dealt with in 2018. ABIs in this clinic were 1.3 bilaterally [name], [name] ([id number]) 02/26/18; patient has a small wound on the left lateral calf. Dimensions are down. She has chronic venous insufficiency and lymphedema. 03/05/18; small open area on the left lateral calf. Dimensions are down. Tightly adherent necrotic debris over the surface of the wound which was difficult to remove. Also the dressing [over collagen] stuck to the wound surface. This was removed with some difficulty as well. Change the primary dressing to Hydrofera Blue ready 03/12/18; small open area on the left lateral calf. Comes in with tightly adherent surface eschar as well as some adherent Hydrofera Blue. 03/19/18; open area on the left lateral calf. Again adherent surface eschar as well as some adherent Hydrofera Blue nonviable subcutaneous tissue. She complained of pain all week even with the reduction from 4-3 layer compression I put on last week. Also she had an increase in her ankle and calf measurements probably related to the same thing. 03/26/18; open area on the left lateral calf. A very small open area remains here. We used silver alginate starting last week as the Hydrofera Blue seem to stick to the wound bed. In using 4-layer compression 04/02/18; the open area in the left lateral calf at some adherent slough which I removed there is no open area  here. We are able to transition her into her own compression stocking. Truthfully I think this is probably his support hose. However this does not maintain skin integrity will be limited. She cannot put over the toe compression stockings on because of neck problems hand problems etc. She is allergic to the lining layer of juxta lites. We might be forced to use extremitease stocking should this fail READMIT 11/24/2018 Patient is now a 76 year old woman who is not a diabetic. She has been in this clinic on at least 3 previous occasions largely with recurrent wounds on her left leg secondary to chronic venous insufficiency with secondary lymphedema. Her situation is complicated by inability to get stockings on and an allergy to neoprene which is apparently a component and at least juxta lites and other stockings. As a result she really has not been wearing any stockings on her legs. She tells [place] that roughly 2 or 3 weeks ago she started noticing a stinging sensation just above her ankle on the left medial aspect. She has been diagnosed with pseudogout and she wondered whether this was what she was  experiencing. She tried to dress this with something she bought at the store however subsequently it pulled skin off and now she has an open wound that is not improving. She has been using Vaseline gauze with a cover bandage. She saw her primary doctor last week who put an Haematologist on her. ABIs in this clinic was 1.03 on the left 2/12; the area is on the left medial ankle. Odd-looking wound with what looks to be surface epithelialization but a multitude of small petechial openings. This clearly not closed yet. We have been using silver alginate under 3 layer compression with TCA 2/19; the wound area did not look quite as good this week. Necrotic debris over the majority of the wound surface which required debridement. She continues to have a multitude of what looked to be small petechial openings. She  reminds [place] that she had a biopsy on this initially during her first outbreak in 2015 in [place] dermatology. She expresses concern about this being a possible melanoma. 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 I assured her that this does not look anything like melanoma. Except for the petechial reaction it does look like a venous insufficiency area and she certainly has evidence of this on both sides 2/26; a difficult area on the left medial ankle. The patient clearly has chronic venous hypertension with some degree of lymphedema. The odd thing about the area is the small petechial hemorrhages. I am not really sure how to explain this. This was present last time and this is not a compression injury. We have been using Hydrofera Blue which I changed to last week 3/4; still using Hydrofera Blue. Aggressive debridement today. She does not have known arterial issues. She has seen Dr. Kellie Simmering at [facility] vein and vascular and and has an ablation on the left. [Anterior accessory branch of the greater saphenous]. From what I remember they did not feel she had an arterial issue. The patient has had this area biopsied in 2009 at [facility] dermat[facility] and by her recollection they said this was "stasis". She is also follow-up with dermatology locally who thought that this was more of a vascular issue 3/11; using Hydrofera Blue. Aggressive debridement today. She does not have an arterial issue. We are using 3 layer compression although we may need to go to 4. The patient has been in for multiple changes to her wrap since I last saw her a week ago. She says that the area was leaking. I do not have too much more information on what was found 01/19/19 on evaluation today patient was actually being seen for a nurse visit when unfortunately she had the area on her left lateral lower extremity as well as weeping from the  right lower extremity that became apparent. Therefore we did end up actually seeing her for a full visit with myself. She is having some pain at this site as well but fortunately nothing too significant at this point. No fevers, chills, nausea, or vomiting noted at this time. 3/18-Patient is back to the clinic with the left leg venous leg ulcer, the ulcer is larger in size, has a surface that is densely adherent with fibrinous tissue, the Hydrofera Blue was used but is densely adherent and there was difficulty in removing it. The right lower extremity was also wrapped for weeping edema. Patient has a new area over the left lateral foot above the malleolus that is  small and appears to have no debris with intact surrounding skin. Patient is on increased dose of Lasix also as a means to edema management 3/25; the patient has a nonhealing venous ulcer on the medial left leg and last week developed a smaller area on the lateral left calf. We have been using Hydrofera Blue with a contact layer. 4/1; no major change in these wounds areas. Left medial and more recently left lateral calf. I tried Iodoflex last week to aid in debridement she did not tolerate this. She stated her pain was terrible all week. She took the top layer of the 4 layer compression off. 4/8; the patient actually looks somewhat better in terms of her more prominent left lateral calf wound. There is some healthy looking tissue here. She is still complaining of a lot of discomfort. 4/15; patient in a lot of pain secondary to sciatica. She is on a prednisone taper prescribed by her primary physician. She has the 2 areas one on the left medial and more recently a smaller area on the left lateral calf. Both of these just above the malleoli 4/22; her back pain is better but she still states she is very uncomfortable and now feels she is intolerant to the [employer]. No real change in the wounds we have been using Sorbact. She has been  previously intolerant to Iodoflex. There is not a lot of option about what we can use to debride this wound under compression that she no doubt needs. sHe states Ultram no longer works for her pain 4/29; no major change in the wounds slightly increased depth. Surface on the original medial wound perhaps somewhat improved however the more recent area on the lateral left ankle is 100% covered in very adherent debris we have been using Sorbact. She tolerates 4 layer compression well and her edema control is a lot better. She has not had to come in for a nurse check 5/6; no major change in the condition of the wounds. She did consent to debridement today which was done with some difficulty. Continuing Sorbact. She did not tolerate Iodoflex. She was in for a check of her compression the day after we wrapped her last week this was adjusted but nothing much was found 5/13; no major change in the condition or area of the wounds. I was able to get a fairly aggressive debridement done on the lateral left leg wound. Even using Sorbact under compression. She came back in on Friday to have the wrap changed. She says she felt uncomfortable on the lateral aspect of her ankle. She has a long history of chronic venous insufficiency including previous ablation surgery on this side. 5/20-Patient returns for wounds on left leg with both wounds covered in slough, with the lateral leg wound larger in size, she has been in 3 layer compression and felt more comfortable, she describes pain in ankle, in leg and pins and needles in foot, and is about to try Pamelor for this 6/3; wounds on the left lateral and left medial leg. The area medially which is the most recent of the 2 seems to have had the largest increase in [place], LEYANNA BITTMAN. ([id number]) dimensions. We have been using Sorbac to try and debride the surface. She has been to see orthopedics they apparently did a plain x-ray that was indeterminant. Diagnosed her with  neuropathy and they have ordered an MRI to determine if there is underlying osteomyelitis. This was not high on my thought list but I suppose  it is prudent. We have advised her to make an appointment with vein and vascular in [place]. She has a history of a left greater saphenous and accessory vein ablations I wonder if there is anything else that can be done from a surgical point of view to help in these difficult refractory wounds. We have previously healed this wound on one occasion but it keeps on reopening [medial side] 6/10; deep tissue culture I did last week I think on the left medial wound showed both moderate E. coli and moderate staph aureus [MSSA]. She is going to require antibiotics and I have chosen Augmentin. We have been using Sorbact and we have made better looking wound surface on both sides but certainly no improvement in wound area. She was back in last Friday apparently for a dressing changes the wrap was hurting her outer left ankle. She has not managed to get a hold of vein and vascular in [place]. We are going to have to make her that appointment 6/17; patient is tolerating the Augmentin. She had an MRI that I think was ordered by orthopedic surgeon this did not show osteomyelitis or an abscess did suggest cellulitis. We have been using Sorbact to the lateral and medial ankles. We have been trying to arrange a follow-up appointment with vein and vascular in [place] or did her original ablations. We apparently an area sent the request to vein and vascular in [facility] 6/24; patient has completed the Augmentin. We do not yet have a vein and vascular appointment in [place]. I am not sure what the issue is here we have asked her to call tomorrow. We are using Sorbact. Making some improvements and especially the medial wound. Both surfaces however look better medial and lateral. 7/1; the patient has been in contact with vein and vascular in [place] but has not yet  received an appointment. Using Sorbact we have gradually improve the wound surface with no improvement in surface area. She is approved for Apligraf but the wound surface still is not completely viable. She has not had to come in for a dressing change 7/8; the patient has an appointment with vein and vascular on 7/31 which is a Friday afternoon. She is concerned about getting back here for [place] to dress her wounds. I think it is important to have them goal for her venous reflux/history of ablations etc. to see if anything else can be done. She apparently tested positive for 1 of the blood tests with regards to lupus and saw a rheumatologist. He has raised the issue of vasculitis again. I have had this thought in the past however the evidence seems overwhelming that this is a venous reflux etiology. If the rheumatologist tells me there is clinical and laboratory investigation is positive for lupus I will rethink this. 7/15; the patient's wound surfaces are quite a bit better. The medial area which was her original wound now has no depth although the lateral wound which was the more recent area actually appears larger. Both with viable surfaces which is indeed better. Using Sorbact. I wanted to use Apligraf on her however there is the issue of the vein and vascular appointment on 7/31 at 2:00 in the afternoon which would not allow her to get back to be rewrapped and they would no doubt remove the graft 7/22; the patient's wound surfaces have moderate amount of debris although generally look better. The lateral one is larger with 2 small satellite areas superiorly. We are waiting for her vein and  vascular appointment on 7/31. She has been approved for Apligraf which I would like to use after th 7/29; wound surfaces have improved no debridement is required we have been using Sorbact. She sees vein and vascular on Friday with this so question of whether anything can be done to lessen the likelihood of  recurrence and/or speed the healing of these areas. She is already had previous ablations. She no doubt has severe venous hypertension 8/5-Patient returns at 1 week, she was in [place] for 3 days by her podiatrist, we have been using so backed to the wound, she has increased pain in both the wounds on the left lower leg especially the more distal one on the lateral aspect 8/12-Patient returns at 1 week and she is agreeable to having debridement in both wounds on her left leg today. We have been using Sorbact, and vascular studies were reviewed at last visit 8/19; the patient arrives with her wounds fairly clean and no debridement is required. We have used Sorbact which is really done a nice job in cleaning up these very difficult wound surfaces. The patient saw Dr. Donzetta Matters of vascular surgery on 7/31. He did not feel that there was an arterial component. He felt that her treated greater saphenous vein is adequately addressed and that the small saphenous vein did not appear to be involved significantly. She was also noted to have deep venous reflux which is not treatable. Dr. Donzetta Matters mentioned the possibility of a central obstructive component leading to reflux and he offered her central venography. She wanted to discuss this or think about it. I have urged her to go ahead with this. She has had recurrent difficult wounds in these areas which do heal but after months in the clinic. If there is anything that can be done to reduce the likelihood of this I think it is worth it. 9/2 she is still working towards getting follow-up with Dr. Donzetta Matters to schedule her CT. Things are quite a bit worse venography. I put Apligraf on 2 weeks ago on both wounds on the medial and lateral part of her left lower leg. She arrives in clinic today with 3 superficial additional wounds above the area laterally and one below the wound medially. She describes a lot of discomfort. I think these are probably wrapped injuries. Does not  look like she has cellulitis. 07/20/2019 on evaluation today patient appears to be doing somewhat poorly in regard to her lower extremity ulcers. She in fact showed signs of erythema in fact we may even be dealing with an infection at this time. Unfortunately I am unsure if this is just infection or if indeed there may be some allergic reaction that occurred as a result of the Apligraf application. With that being said that would be unusual but nonetheless not impossible in this patient is one who is unfortunately allergic to quite a bit. Currently we have been using the Sorbact which seems to do as well as anything for her. I do think we may want to obtain a culture today to see if there is anything showing up there that may need to be addressed. 9/16; noted that last week the wounds look worse in 1 week follow-up of the Apligraf. Using Sorbact as of 2 days ago. She arrives with copious amounts of drainage and new skin breakdown on the back of the left calf. The wounds arm more substantial bilaterally. There is a fair amount of swelling in the left calf no overt DVT there  is edema present I think in the left greater than right thigh. She is supposed to go on 9/28 for CT venography. The wounds on the medial and lateral calf are worse and she has new skin breakdown posteriorly at least new for me. This is almost developing into a circumferential wound area The Apligraf was taken off last week which I agree with things are not going in the right direction a culture was done we do not have that back yet. She is on Augmentin that she started 2 days ago 9/23; dressing was changed by her nurses on Monday. In general there is no improvement in the wound areas although the area looks less angry than last week. She did get Augmentin for MSSA cultured on the 14th. She still appears to have too much swelling in the left leg even with 3 layer compression 9/30; the patient underwent her procedure on 9/28 by Dr.  Donzetta Matters at [facility]. She was discovered to have the common iliac vein measuring 12.2 mm but at the level of L4-L5 measured 3 mm. After stenting it measured 10 mm. 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 Rouleaux flow in the common femoral and femoral vein was observed much improved after stenting. We are using silver alginate to the wounds on the medial and lateral ankle on the left. 4 layer compression 10/7; the patient had fluid swelling around her knee and 4 layer compression. At the advice of vein and vascular this was reduced to 3 layer which she is tolerating better. We have been using silver alginate under 3 layer compression since last Friday 10/14; arrives with the areas on the left ankle looking a lot better. Inflammation in the area also a lot better. She came in for a nurse check on 10/9 10/21; continued nice improvement. Slight improvements in surface area of both the medial and lateral wounds on the left. A lot of the satellite lesions in the weeping erythema around these from stasis dermatitis is resolved. We have been using silver alginate NATOSHA, BOU ([id number]) 10/28; general improvement in the entire wound areas although not a lot of change in dimensions the wound certainly looks better. There is a lot less in terms of venous inflammation. Continue silver alginate this week however look towards Hydrofera Blue next week 11/4; very adherent debris on the medial wound left wound is not as bad. We have been using silver alginate. Change to [facility] today 11/11; very adherent debris on both wound areas. She went to vein and vascular last week and follow-up they put in [place] boot on this today. He says the [facility] was adherent. Wound is definitely not as good as last week. Especially on the left there the satellite lesions look more prominent 11/18; absolutely no better. erythema on lateral aspect with tenderness. 09/30/2019 on  evaluation today patient appears to actually be doing better. Dr. Dellia Nims did put her on doxycycline last week which I do believe has helped her at this point. Fortunately there is no signs of active infection at this time. No fevers, chills, nausea, vomiting, or diarrhea. I do believe he may want extend the doxycycline for 7 additional days just to ensure everything does completely cleared up the patient is in agreement with that plan. Otherwise she is going require some sharp debridement today 12/2; patient is completing a 2-week course of doxycycline. I gave her this empirically for inflammation as well as infection when I last saw her  2 weeks ago. All of this seems to be better. She is using silver alginate she has the area on the medial aspect of the larger area laterally and the 2 small satellite regions laterally above the major wound. 12/9; the patient's wound on the left medial and left lateral calf look really quite good. We have been using silver alginate. She saw vein and vascular in follow-up on 10/09/2019. She has had a previous left greater saphenous vein ablation by Dr. Oscar La in 2016. More recently she underwent a left common iliac vein stent by Dr. 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 The swelling is improved and certainly the wounds have improved. The patient shows [place] today area on the right medial calf there is almost no wound but leaking lymphedema. She says she start this started 3 or 4 days ago. She did not traumatize it. It is not painful. She does not wear compression on that side 12/16; the patient continues to do well laterally. Medially still requiring debridement. The area on the right calf did not materialize to anything and is not currently open. We wrapped this last time. She has support stockings for that leg although I am not sure they are going to provide adequate compression 12/23; the lateral wound looks stable. Medially still requiring debridement for  tightly adherent fibrinous debris. We've been using silver alginate. Surface area not any different 12/30; neither wound is any better with regards to surface and the area on the left lateral is larger. I been using silver alginate to the left lateral which look quite good last week and Sorbact to the left medial 11/11/2019. Lateral wound area actually looks better and somewhat smaller. Medial still requires a very aggressive debridement today. We have been using Sorbact on both wound areas 1/13; not much better still adherent debris bilaterally. I been using Sorbact. She has severe venous hypertension. Probably some degree of dermal fibrosis distally. I wonder whether tighter compression might help and I am going to try that today. We also need to work on the bioburden 1/20; using Sorbact. She has severe venous hypertension status post stent placement for pelvic vein compression. We applied gentamicin last time to see if we could reduce bioburden I had some discussion with her today about the use of pentoxifylline. This is occasionally used in this setting for wounds with refractory venous insufficiency. However this interacts with Plavix. She tells me that she was put on this after stent placement for 3 months. She will call Dr. Claretha Cooper office to discuss 1/27; we are using gentamicin under Sorbact. 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 She has a stent. Wound medially is measuring smaller this week. Laterally measuring slightly larger although she has some satellite lesions superiorly 2/3; gentamicin under Sorbact under 4-layer compression. 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 She has a stent on Plavix. Her wounds are measuring smaller this week. More substantially laterally where there is a satellite lesion superiorly. 2/10; gentamicin under Sorbac. 4-layer compression. Patient communicated with Dr. Donzetta Matters at vein and vascular in  [place]. He is okay with the patient coming off Plavix I will therefore start her on pentoxifylline for a 1 month trial. In general her wounds look better today. I had some concerns about swelling in the left thigh however she measures 61.5 on the right and 63 on the mid thigh which does not suggest there is any difficulty. The patient is not describing any pain. 2/17; gentamicin under  Sorbac 4-layer compression. She has been on pentoxifylline for 1 week and complains of loose stool. No nausea she is eating and drinking well 2/24; the patient apparently came in 2 days ago for a nurse visit when her wrap fell down. Both areas look a little worse this week macerated medially and satellite lesions laterally. Change to silver alginate today 3/3; wounds are larger today especially medially. She also has more swelling in her foot lower leg and I even noted some swelling in her posterior thigh which is tender. I wonder about the patency of her stent. Fortuitously she sees Dr. Claretha Cooper group on Friday 3/10; Mrs. Guerrier was seen by vein and vascular on 3/5. The patient underwent ultrasound. There was no evidence of thrombosis involving the IVC no evidence of thrombosis involving the right common iliac vein there is no evidence of thrombosis involving the right external iliac vein the left external vein is also patent. The right common iliac vein stent appears patent bilateral common femoral veins are compressible and appear patent. I was concerned about the left common iliac stent however it looks like this is functional. She has some edema in the posterior thigh that was tender she still has that this week. I also note they had trouble finding the pulses in her left foot and booked her for an ABI baseline in 4 weeks. She will follow up in 6 months for repeat IVC duplex. The patient stopped the pentoxifylline because of diarrhea. It does not look like that was being effective in any case. I have advised her  to go back on her aspirin 81 mg tablet, vascular it also suggested this 3/17; comes in today with her wound surfaces a lot better. The excoriations from last week considerably better probably secondary to the TCA. We have been using silver alginate 3/24; comes in today with smaller wounds both medially and laterally. Both required debridement. There are 2 small satellite areas superiorly laterally. She also has a very odd bandlike area in the mid calf almost looking like there was a weakness in the wrap in a localized area. I would write this off as being this however anteriorly she has a small raised ballotable area that is very tender almost reminiscent of an abscess but there was no obvious purulent surface to it. 02/04/20 upon evaluation today patient appears to be doing fairly well in regard to her wounds today. Fortunately there is no signs of active infection at this time. No fevers, chills, nausea, vomiting, or diarrhea. She has been tolerating the dressing changes without complication. Fortunately I feel like she is showing signs of improvement although has been sometime since have seen her. Nonetheless the area of concern that Dr. Dellia Nims had last week where she had possibly an area of the wrap that was we can allow the leg to bulge appears to be doing significantly better today there is no signs of anything worsening. MIRABEL, AHLGREN ([id number]) 4/7; the patient's wounds on her medial and lateral left leg continue to contract. We have been using a regular alginate. Last week she developed an area on the right medial lower leg which is probably a venous ulcer as well. 4/14; the wounds on her left medial and lateral lower leg continue to contract. Surface eschar. We have been using regular alginate. The area on the right medial lower leg is closed. We have been putting both legs under 4-layer contraction. The patient went back to see vein and vascular she had arterial studies done  which  were apparently "quite good" per the patient although I have not read their notes I have never felt she had an arterial issue. 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 This is been longstanding and refractory to exercise, leg elevation and longstanding use of compression wraps in our clinic as well as compression stockings on the times we have been able to get these to heal 4/21; we thought she actually might be close this week however she arrives in clinic with a lot of edema in her upper left calf and into her posterior thigh. This is been an intermittent problem here. She says the wrap fell down but it was replaced with a nurse visit on Monday. We are using calcium alginate to the wounds and the wound sizes there not terribly larger than last week but there is a lot more edema 4/28; again wound edges are smaller on both sides. Her edema is better controlled than last time. She is obtained her compression pumps from [employer] although they have not been to her home to set these up. 5/5; left medial and left lateral both look stable. I am not sure the medial is any smaller. We have been using calcium alginate under 4-layer compression. oShe had an area on the right medial. This was eschared today. We have been wrapping this as well. She does not tolerate external compression stockings due to a history of various contact allergies. She has her compression pumps however the representative from the company is coming on her to show her how to use these tomorrow 5/19; patient with severe chronic venous insufficiency secondary to central venous disease. She had a stent placed in her left common iliac vein. She has done better since but still difficult to control wounds. She comes in today with nothing open on the right leg. Her areas on the left medial and left lateral are just about closed. We are using calcium alginate under 4-layer compression.  She is using her external compression pumps at home She only has 15-20 support stockings. States she cannot get anything tighter than that on. 03/30/20-Patient returns at 1 week, the wounds on the left leg are both slightly bigger, the last week she was on 3 layer compression which started to slide down. She is starting to use her lymphedema pumps although she stated on 1 day her right ankle started to swell up and she have to stop that day. Unfortunately the open area seem to oscillate between improving to the point of healing and then flaring up all to do with effectiveness of compression or lack of due to the left leg topography not keeping the compression wraps from rolling down 6/2 patient comes in with a 15/20 mmHg stocking on the right leg. She tells me that she developed a lot of swelling in her ankles she saw orthopedics she was felt to possibly be having a flare of pseudogout versus some other type of arthritis. She was put on steroids for a respiratory issue so that helps with the inflammation. She has not been using the pumps all week. She thinks the left thigh is more swollen than usual and I would agree with that. She has an appointment with Dr. Donzetta Matters 9 days or so from now 6/9; both wounds on the left medial and left lateral are smaller. We have been using calcium alginate under compression. She does not have an open wound on the right leg she is using a stocking and her  compression pumps things are going well. She has an appointment with Dr. Donzetta Matters with regards to her stent in the left common iliac vein 6/16; the wounds on the left medial and left lateral ankle continues to contract. The patient saw Dr. Donzetta Matters and I think he seems satisfied. Ordered follow-up venous reflux studies on both sides in September. Cautioned that she may need thigh-high stockings. She has been using calcium alginate under compression on the left and her own stocking on the right leg. She tells [place] there are no open  wounds on the right 6/23; left lateral is just about closed. Medial required debridement today. We have been using calcium alginate. Extensive discussion about the compression pumps she is only using these on 25 mmHg states she could not take 40 or 30 when the wrap came out to her home to demonstrate these. He said they should not feel tight 6/30; the left lateral wound has a slight amount of eschar. . The area medially is about the same using Hydrofera Blue. 7/7; left lateral wound still has some eschar. I will remove this next week may be closed. The area medially is very small using Hydrofera Blue with improvement. Unfortunately the stockings fell down. Unfortunately the blisters have developed at the edge of where the wrap fell. When this happened she says her legs hurt she did not use her pumps. We are not open Monday for her to come in and change the wraps and she had an appointment yesterday. She also tells me that she is going to have an MRI of her back. She is having pain radiating into her left anterior leg she thinks her from an L5 disc. She saw Dr. Ellene Route of neurosurgery 7/14; the area on the left lateral ankle area is closed. Still a small area medially however it looks better as well. We have been using Hydrofera Blue under 4-layer compression 7/21; left lateral ankle is still closed however her wound on the medial left calf is actually larger. This is probably because Hydrofera Blue got stuck to the wound. She came in for a nurse change on Friday and will do that again this week I was concerned about the amount of swelling that she had last week however she is using her compression pumps twice a day and the swelling seems well controlled 7/28; remaining wound on the left medial lower leg is smaller. We have been using moistened silver collagen under compression she is coming back for a nurse visit. For reasons that were not really clear she was just keeping her legs elevated and not  using her compression pumps. I have asked her to use the compression pumps. She does not have any wounds on the right leg 06/15/20-Patient returns at 2 weeks, her LLE edema is worse and she developed a blister wound that is new and has bigger posterior calf wound on right, we are using Prisma with pad, 4 layer compression. she has been on lasix 40 mg daily 8/18; patient arrives today with things a lot worse than I remember from a few weeks ago. She was seen last week. Noted that her edema was worse and that she now had a left lateral wound as well as deteriorating edema in the medial and posterior part of the lower leg. She says she is using his or her external compression pumps once a day although I wonder about the compliance. 8/25; weeping area on the right medial lower leg. This had actually gotten a small localized area  of her compression stocking wet. oOn the left side there is a large denuded area on the posterior medial lower leg and smaller area on the lateral. This was not the original areas that we dealt with. 9/1 the patient's wound on the left leg include the left lateral and left posterior. Larger superficial wounds weeping. She has very poor edema control. Tender localized edema in the left lower medial ankle/heel probably because of localized wrap issues. She freely admits she is not using the compression pumps. She has been up on her feet a lot. She thinks the hydrofera blue is contributing to the pain she is experiencing.. This is a complaint that I have occasionally heard 9/8; really not much improvement. The patient is still complaining of a lot of pain particularly when she uses compression pumps. I switched her to silver alginate last time because she found the Hydrofera Blue to be irritating. I don't hear much difference in her description with the silver alginate. 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 She has follow-up with Dr. Donzetta Matters I think for ultrasound next month YU, CRAGUN ([id number]) 9/15; quite a bit of improvement today. We have less edema and more epithelialization in both of her wound areas on the left medial and left lateral calf. These are not the site of her original wounds in this area. She says she has been using her compression pumps for 30 minutes twice a day, there pain issues that never quite understood. Silver alginate as the primary dressing 9/22; continued improvement. Both areas medially and laterally still have a small open area there is some eschar. She continues to complain of left medial ankle pain. Swelling in the leg is in much better condition. We have been using silver alginate 9/29; continued improvement. Both areas medially and laterally in the left calf look as though they are close some minor surface eschar but I think this is epithelialized. She comes in today saying she has a ruptured disc at L4-L5 cannot bend over to put on her stockings. 10/6; patient comes in today with no open wounds on either leg. However her edema on the left leg in the upper one third of the lower leg is poorly controlled nonpitting. She says that she could not use the pumps for 2 days and then she has been using the last couple of days. It is not clear to me she has been able to get her stocking on. She has back problems. Mrs. Lukes has severe chronic venous insufficiency with secondary lymphedema. Her venous insufficiency is partially centrally mediated and that she is now post stent in the left common iliac vein. [facility] Thurner's syndrome/physiology]. She follows up with them on 10/15. She wears 20/30 below-knee stockings. She is supposed to use compression pumps at home although I think her compliance about with this is been less than 100%. I have asked her to use these 3 times a day. Finally I think she has lipodermatosclerosis in the left lower leg with an inverted  bottle sign. It is been a major problem controlling the edema in the left leg. The right leg we have had wounds on but not as significant a problem is on the left READMISSION 04/12/2021 Mrs. Wasser is a 76 year old woman we know well in this clinic. She has severe chronic venous insufficiency. 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 I believe she has had bilateral greater saphenous vein ablation in the past as well. She tells me that this wound opened sometime in March. She had a fall and thinks it was initially abrasion. She developed areas she describes as little blisters on the anterior part of her leg and she saw dermatology and was treated for methicillin staph aureus with several rounds of antibiotics. She has been using support stockings on the left leg and says this is the only thing she can get on. Her compression pump use maybe once a day she says if she did not use one she use the other. She comes in today with incredible swelling in the left leg with a wound on the left posterior calf. She has been using Neosporin to this previously a hydrocolloid. 6/15; patient arrives back for 1 week follow-up.Marland Kitchen Apparently her wrap fell down she did not call us to replace this. He has poor edema control. She only uses her compression pumps once a day 6/29; patient presents for 1 week follow-up. She has tolerated the compression wrap well. We have been using Iodoflex under the wrap. She has no issues or complaints today. 7/6; patient presents for 1 week follow-up. She states that the compression wrap rolled down her leg 4 days ago. She has been trying to keep the area covered but has no dressings at home to use. She denies signs of infection. 7/13; patient presents for 1 week follow-up. She states that her compression wrap rolled down her right leg and she called our office and had it placed a few days ago. She has been tolerating the current wrap well. She  states that the Iodoflex is causing a burning sensation. She denies signs of infection. 7/27; patient presents for 1 week follow-up She has tolerated the compression wrap well with Hydrofera Blue underneath. She has now developed a wound to her right lower extremity. She reports having a culture done To this area by her dermatologist that she reports is negative. She currently denies signs of infection. 8/30; patient presents for 1 week follow-up. On the left side she has tolerated the compression wrap well with Hydrofera Blue underneath. She reports some discomfort to her right lower extremity with the 3 layer compression. She currently denies signs of infection. 06/14/2021 upon evaluation today patient's wound actually appears to be doing decently well based on what I am seeing currently. She actually has 2 areas 1 on the left distal/posterior lower leg and the other on the right lower leg. Subsequently the measurements are roughly the same may be slightly smaller but in general have not made any trend towards getting worse which is great news. 06/23/2021 upon evaluation today patient appears to be doing pretty well in regard to her wounds. On the right she is having difficulty with sciatic pain and this subsequently has led to her taking the wrap off over the last week her leg is more swollen she is also been on prednisone for 10 days. With that being said I think we may want to just use a compression sock on the left that way she will not have to worry about the compression wrap being in place that she cannot get off. With that being said I do believe as well that the patient is going to need to continue with the wrapping on the left we will also need to do a little bit of sharp debridement today. 8/24; patient presents for 1 week follow-up. She has no issues  or complaints today. She denies signs of infection. 8/31; patient presents for 1 week follow-up. She has no issues or complaints today. She  has tolerated the compression wrap well on the left lower extremity. She denies signs of infection. 9/7; patient presents for follow-up. She reports that the compression wrap rolled down slightly on her right lower extremity. She reports tenderness to the wound bed. She denies increased warmth or erythema to the surrounding skin. She overall feels well. 9/14; patient presents for follow-up. She has no issues or complaints today. She denies signs of infection. PuraPly is available and patient would like to start this today. 9/21; patient presents for follow-up. She has no issues or complaints today. She tolerated the first skin substitute placement last week under compression. 9/28; patient presents for 1 week follow-up. She has no issues or complaints today. 10/5; patient presents for 1 week follow-up. She reports taking the wrap off yesterday. She was on her feet more yesterday and developed more swelling to her left lower extremity. She denies pain. Overall she is doing well. 10/12; patient presents for 1 week follow-up. She has no issues or complaints today. 10/19; patient presents for follow-up. She has no issues or complaints today. She tolerated the compression wrap well. She states she has compression stockings at home. MIU, CHIONG ([id number]) Readmission: 11/27/2021 upon evaluation today patient appears to be doing poorly in regard to the wounds on her right leg. She also has an area of erythema in the proximal calf area of the right leg which again I do believe is a issue from the standpoint of it being warm and erythematous to touch and also seems to be somewhat localized I really think this may be more of a cellulitis issue than anything else. Fortunately I do not see any signs of active infection Systemically at this time. 12/05/2021 upon evaluation patient's wounds actually appear to be showing some signs of improvement which is not too bad and just 1 week's time. Especially  since we had to get on the right antibiotic which is only been for the past 4 days. Fortunately I do not see any signs of active infection locally nor systemically at this time that has gotten any worse. Overall I think that again with the antibiotic she is significantly improved and currently she is taking Levaquin. 12/11/2021 upon evaluation today patient appears to be doing well with regard to her leg. Between the antibiotics and the triamcinolone I feel like she is doing significantly better which is great news. Overall I am extremely pleased with where we stand. There does not appear to be any signs of active infection locally or systemically at this time. 12/18/2021 upon evaluation today patient appears to be doing well with regard to her wound. I do feel like this is showing signs of some improvement here. Little by little this is clearing up quite nicely. I think it is just a slow process but nonetheless 1 that we are seeing evidence of improvement with regard to. 12/25/2021 upon evaluation today patient appears to be doing well with regard to her wounds she is definitely making some good progress here. Fortunately I do not see any signs of active infection locally or systemically at this time which is great news. No fevers, chills, nausea, vomiting, or diarrhea. Electronic Signature(s) Signed: 12/25/2021 2:58:53 PM By: Worthy Keeler PA-C Entered By: Worthy Keeler on 12/25/2021 14:58:52 Berk, [name] ([id number]) -------------------------------------------------------------------------------- Physical Exam Details Patient Name: MARIJANE, TROWER. Date of  Service: 12/25/2021 2:15 PM Medical Record Number: [id number] Patient Account Number: [id number] Date of Birth/Sex: October 11, 1946 (76 y.o. F) Treating RN: Levora Dredge Primary Care Provider: [name] Other Clinician: Referring Provider: [name] Treating Provider/Extender: Jeri Cos Weeks in Treatment:  4 Constitutional [name] and well-hydrated in no acute distress. Respiratory normal breathing without difficulty. Psychiatric this patient is able to make decisions and demonstrates good insight into disease process. Alert and Oriented x 3. pleasant and cooperative. Notes Upon inspection patient's wound actually is showing signs of significant improvement in fact we have developed more into the medial wound and the posterior wound and I think we will get a go ahead and relabel those today as well. Otherwise I feel like the patient is actually doing quite well based on what I am seeing currently. Electronic Signature(s) Signed: 12/25/2021 2:59:18 PM By: Worthy Keeler PA-C Entered By: Worthy Keeler on 12/25/2021 14:59:18 Backes, [name] ([id number]) -------------------------------------------------------------------------------- Physician Orders Details Patient Name: [name], [name]. Date of Service: 12/25/2021 2:15 PM Medical Record Number: [id number] Patient Account Number: [id number] Date of Birth/Sex: 1946/03/31 (76 y.o. F) Treating RN: Levora Dredge Primary Care Provider: [name] Other Clinician: Referring Provider: [name] Treating Provider/Extender: [name] in Treatment: 4 Verbal / Phone Orders: No Diagnosis Coding ICD-10 Coding Code Description I89.0 Lymphedema, not elsewhere classified I87.331 Chronic venous hypertension (idiopathic) with ulcer and inflammation of right lower extremity L97.811 Non-pressure chronic ulcer of other part of right lower leg limited to breakdown of skin Follow-up Appointments o Return Appointment in 1 week. o Nurse Visit as needed Bathing/ Shower/ Hygiene o May shower with wound dressing protected with water repellent cover or cast protector. Edema Control - Lymphedema / Segmental Compressive Device / Other o Optional: One layer of unna paste to top of compression wrap (to act as an anchor). o  Elevate, Exercise Daily and Avoid Standing for Long Periods of Time. o Elevate leg(s) parallel to the floor when sitting. o DO YOUR BEST to sleep in the bed at night. DO NOT sleep in your recliner. Long hours of sitting in a recliner leads to swelling of the legs and/or potential wounds on your backside. Wound Treatment Wound #15 - Lower Leg Wound Laterality: Right, Posterior Cleanser: Soap and Water Discharge Instructions: Gently cleanse wound with antibacterial soap, rinse and pat dry prior to dressing wounds Topical: Triamcinolone Acetonide Cream, 0.1%, 15 (g) tube Discharge Instructions: Apply as directed by provider. apply to wound and leg Primary Dressing: Xtrasorb Classic Super Absorbent Dressing, 6x9 (in/in) Discharge Instructions: over open areas of the leg that are weeping Compression Wrap: Profore Lite LF 3 Multilayer Compression Bandaging System Discharge Instructions: Apply 3 multi-layer wrap as prescribed. Wound #16 - Lower Leg Wound Laterality: Right, Medial Cleanser: Soap and Water Discharge Instructions: Gently cleanse wound with antibacterial soap, rinse and pat dry prior to dressing wounds Topical: Triamcinolone Acetonide Cream, 0.1%, 15 (g) tube Discharge Instructions: Apply as directed by provider. apply to wound and leg Primary Dressing: Xtrasorb Classic Super Absorbent Dressing, 6x9 (in/in) Discharge Instructions: over open areas of the leg that are weeping Compression Wrap: Profore Lite LF 3 Multilayer Compression Bandaging System Discharge Instructions: Apply 3 multi-layer wrap as prescribed. Electronic Signature(s) Signed: 12/25/2021 4:27:51 PM By: Levora Dredge Signed: 12/25/2021 5:14:22 PM By: Worthy Keeler PA-C Entered By: Levora Dredge on 12/25/2021 16:27:51 LAVALLEE, CHANDY TARMAN ([id number]) TOMIE, SPIZZIRRI ([id number]) -------------------------------------------------------------------------------- Problem List Details Patient Name: REBEKKAH, POWLESS. Date of Service: 12/25/2021 2:15 PM Medical  Record Number: [id number] Patient Account Number: [id number] Date of Birth/Sex: 1946-09-15 (76 y.o. F) Treating RN: Levora Dredge Primary Care Provider: [name] Other Clinician: Referring Provider: [name] Treating Provider/Extender: [name] in Treatment: 4 Active Problems ICD-10 Encounter Code Description Active Date MDM Diagnosis I89.0 Lymphedema, not elsewhere classified 11/27/2021 No Yes I87.331 Chronic venous hypertension (idiopathic) with ulcer and inflammation of 11/27/2021 No Yes right lower extremity L97.811 Non-pressure chronic ulcer of other part of right lower leg limited to 11/27/2021 No Yes breakdown of skin Inactive Problems Resolved Problems Electronic Signature(s) Signed: 12/25/2021 2:12:04 PM By: Worthy Keeler PA-C Entered By: Worthy Keeler on 12/25/2021 14:12:04 Samek, [name] ([id number]) -------------------------------------------------------------------------------- Progress Note Details Patient Name: [name], [name]. Date of Service: 12/25/2021 2:15 PM Medical Record Number: [id number] Patient Account Number: [id number] Date of Birth/Sex: 01-28-1946 (76 y.o. F) Treating RN: Levora Dredge Primary Care Provider: [name] Other Clinician: Referring Provider: [name] Treating Provider/Extender: [name] in Treatment: 4 Subjective Chief Complaint Information obtained from Patient Right LE Ulcer History of Present Illness (HPI) Pleasant 76 year old with history of chronic venous insufficiency. No diabetes or peripheral vascular disease. Left ABI 1.29. Questionable history of left lower extremity DVT. She developed a recurrent ulceration on her left lateral calf in December 2015, which she attributes to poor diet and subsequent lower extremity edema. She underwent endovenous laser ablation of her left greater saphenous vein in 2010. She underwent laser  ablation of accessory branch of left GSV in April 2016 by Dr. Kellie Simmering at [facility]. She was previously wearing Unna boots, which she tolerated well. Tolerating 2 layer compression and cadexomer iodine. She returns to clinic for follow-up and is without new complaints. She denies any significant pain at this time. She reports persistent pain with pressure. No claudication or ischemic rest pain. No fever or chills. No drainage. READMISSION 11/13/16; this is a 76 year old woman who is not a diabetic. She is here for a review of a painful area on her left medial lower extremity. I note that she was seen here previously last year for wound I believe to be in the same area. At that time she had undergone previously a left greater saphenous vein ablation by Dr. Kellie Simmering and she had a ablation of the anterior accessory branch of the left greater saphenous vein in March 2016. Seeing that the wound actually closed over. In reviewing the history with her today the ulcer in this area has been recurrent. She describes a biopsy of this area in 2009 that only showed stasis physiology. 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 Do not see that she has ever had arterial studies in the left leg. She tells me she has a follow-up with Dr. Kellie Simmering in roughly 10 days In any case she developed the reopening of this area roughly a month ago. On the background of this she describes rapidly increasing edema which has responded to Lasix 40 mg and metolazone 2.5 mg as well as the patient's lymph massage. She has been told she has both venous insufficiency and lymphedema but she cannot tolerate compression stockings 11/28/16; the patient saw Dr. Kellie Simmering recently. Per the patient he did arterial Dopplers in the office  that did not show evidence of arterial insufficiency, per  the patient he stated "treat this like an ordinary venous ulcer". She also saw her dermatologist Dr. Ronnald Ramp who felt that this was more of a vascular ulcer. In general things are improving although she arrives today with increasing bilateral lower extremity edema with weeping a deeper fluid through the wound on the left medial leg compatible with some degree of lymphedema 12/04/16; the patient's wound is fully epithelialized but I don't think fully healed. We will do another week of depression with Promogran and TCA however I suspect we'll be able to discharge her next week. This is a very unusual-looking wound which was initially a figure-of-eight type wound lying on its side surrounded by petechial like hemorrhage. She has had venous ablation on this side. She apparently does not have an arterial issue per Dr. Kellie Simmering. She saw her dermatologist thought it was "vascular". Patient is definitely going to need ongoing compression and I talked about this with her today she will go to elastic therapy after she leaves here next week 12/11/16; the patient's wound is not completely closed today. She has surrounding scar tissue and in further discussion with the patient it would appear that she had ulcers in this area in 2009 for a prolonged period of time ultimately requiring a punch biopsy of this area that only showed venous insufficiency. I did not previously pickup on this part of the history from the patient. 12/18/16; the patient's wound is completely epithelialized. There is no open area here. She has significant bilateral venous insufficiency with secondary lymphedema to a mild-to-moderate degree she does not have compression stockings.. She did not say anything to me when I was in the room, she told our intake nurse that she was still having pain in this area. This isn't unusual recurrent small open area. She is going to go to elastic therapy to  obtain compression stockings. 12/25/16; the patient's wound is fully epithelialized. There is no open area here. The patient describes some continued episodic discomfort in this area medial left calf. However everything looks fine and healed here. She is been to elastic therapy and caught herself 15-20 mmHg stockings, they apparently were having trouble getting 20-30 mm stockings in her size 01/22/17; this is a patient we discharged from the clinic a month ago. She has a recurrent open wound on her medial left calf. She had 15 mm support stockings. I told her I thought she needed 20-30 mm compression stockings. She tells me that she has been ill with hospitalization secondary to asthma and is been found to have severe hypokalemia likely secondary to a combination of Lasix and metolazone. This morning she noted blistering and leaking fluid on the posterior part of her left leg. She called our intake nurse urgently and we was saw her this afternoon. She has not had any real discomfort here. I don't know that she's been wearing any stockings on this leg for at least 2-3 days. ABIs in this clinic were 1.21 on the right and 1.3 on the left. She is previously seen vascular surgery who does not think that there is a peripheral arterial issue. 01/30/17; Patient arrives with no open wound on the left leg. She has been to elastic therapy and obtained 20-94mmhg below knee stockings and she has one on the right leg today. READMISSION 02/19/18; this Muhlbauer is a now 76 year old patient we've had in this clinic perhaps 3 times before. I had last looked at her from January 07 December 2016 with an area on the medial left  leg. We discharged her on 12/25/16 however she had to be readmitted on 01/22/17 with a recurrence. I have in my notes that we discharged her on 20-30 mm stockings although she tells me she was only wearing support hose because she cannot get stockings on predominantly related to her cervical spine  surgery/issues. She has had previous ablations done by vein and vascular in [name] including a great saphenous vein ablation on the left with an anterior accessory branch ablation I think both of these were in 2016. On one of the previous visit she had a biopsy noted 2009 that was negative. She is not felt to have an arterial issue. She is not a diabetic. She does have a history of obstructive sleep apnea hypertension asthma as well as chronic venous insufficiency and lymphedema. RIVA, SESMA ([id number]) On this occasion she noted 2 dry scaly patch on her left leg. She tried to put lotion on this it didn't really help. There were 2 open areas.the patient has been seeing her primary physician from 02/05/18 through 02/14/18. She had Unna boots applied. The superior wound now on the lateral left leg has closed but she's had one wound that remains open on the lateral left leg. This is not the same spot as we dealt with in 2018. ABIs in this clinic were 1.3 bilaterally 02/26/18; patient has a small wound on the left lateral calf. Dimensions are down. She has chronic venous insufficiency and lymphedema. 03/05/18; small open area on the left lateral calf. Dimensions are down. Tightly adherent necrotic debris over the surface of the wound which was difficult to remove. Also the dressing [over collagen] stuck to the wound surface. This was removed with some difficulty as well. Change the primary dressing to Hydrofera Blue ready 03/12/18; small open area on the left lateral calf. Comes in with tightly adherent surface eschar as well as some adherent Hydrofera Blue. 03/19/18; open area on the left lateral calf. Again adherent surface eschar as well as some adherent Hydrofera Blue nonviable subcutaneous tissue. She complained of pain all week even with the reduction from 4-3 layer compression I put on last week. Also she had an increase in her ankle and calf measurements probably related to the same  thing. 03/26/18; open area on the left lateral calf. A very small open area remains here. We used silver alginate starting last week as the Hydrofera Blue seem to stick to the wound bed. In using 4-layer compression 04/02/18; the open area in the left lateral calf at some adherent slough which I removed there is no open area here. We are able to transition her into her own compression stocking. Truthfully I think this is probably his support hose. However this does not maintain skin integrity will be limited. She cannot put over the toe compression stockings on because of neck problems hand problems etc. She is allergic to the lining layer of juxta lites. We might be forced to use extremitease stocking should this fail READMIT 11/24/2018 Patient is now a 76 year old woman who is not a diabetic. She has been in this clinic on at least 3 previous occasions largely with recurrent wounds on her left leg secondary to chronic venous insufficiency with secondary lymphedema. Her situation is complicated by inability to get stockings on and an allergy to neoprene which is apparently a component and at least juxta lites and other stockings. As a result she really has not been wearing any stockings on her legs. She tells [place] that roughly 2  or 3 weeks ago she started noticing a stinging sensation just above her ankle on the left medial aspect. She has been diagnosed with pseudogout and she wondered whether this was what she was experiencing. She tried to dress this with something she bought at the store however subsequently it pulled skin off and now she has an open wound that is not improving. She has been using Vaseline gauze with a cover bandage. She saw her primary doctor last week who put an Haematologist on her. ABIs in this clinic was 1.03 on the left 2/12; the area is on the left medial ankle. Odd-looking wound with what looks to be surface epithelialization but a multitude of small petechial openings. This  clearly not closed yet. We have been using silver alginate under 3 layer compression with TCA 2/19; the wound area did not look quite as good this week. Necrotic debris over the majority of the wound surface which required debridement. She continues to have a multitude of what looked to be small petechial openings. She reminds [place] that she had a biopsy on this initially during her first outbreak in 2015 in [place] [facility]. She expresses concern about this being a possible melanoma. 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 I assured her that this does not look anything like melanoma. Except for the petechial reaction it does look like a venous insufficiency area and she certainly has evidence of this on both sides 2/26; a difficult area on the left medial ankle. The patient clearly has chronic venous hypertension with some degree of lymphedema. The odd thing about the area is the small petechial hemorrhages. I am not really sure how to explain this. This was present last time and this is not a compression injury. We have been using Hydrofera Blue which I changed to last week 3/4; still using Hydrofera Blue. Aggressive debridement today. She does not have known arterial issues. She has seen Dr. Kellie Simmering at [facility] vein [facility] vascular and and has an ablation on the left. [Anterior accessory branch of the greater saphenous]. From what I remember they did not feel she had an arterial issue. The patient has had this area biopsied in 2009 at [facility] dermatology and by her recollection they said this was "stasis". She is also follow-up with dermatology locally who thought that this was more of a vascular issue 3/11; using Hydrofera Blue. Aggressive debridement today. She does not have an arterial issue. We are using 3 layer compression although we may need to go to 4. The patient has been in for multiple changes  to her wrap since I last saw her a week ago. She says that the area was leaking. I do not have too much more information on what was found 01/19/19 on evaluation today patient was actually being seen for a nurse visit when unfortunately she had the area on her left lateral lower extremity as well as weeping from the right lower extremity that became apparent. Therefore we did end up actually seeing her for a full visit with myself. She is having some pain at this site as well but fortunately nothing too significant at this point. No fevers, chills, nausea, or vomiting noted at this time. 3/18-Patient is back to the clinic with the left leg venous leg ulcer, the ulcer is larger in size, has a surface that is densely adherent with fibrinous tissue, the Hydrofera Blue was used but is  densely adherent and there was difficulty in removing it. The right lower extremity was also wrapped for weeping edema. Patient has a new area over the left lateral foot above the malleolus that is small and appears to have no debris with intact surrounding skin. Patient is on increased dose of Lasix also as a means to edema management 3/25; the patient has a nonhealing venous ulcer on the medial left leg and last week developed a smaller area on the lateral left calf. We have been using Hydrofera Blue with a contact layer. 4/1; no major change in these wounds areas. Left medial and more recently left lateral calf. I tried Iodoflex last week to aid in debridement she did not tolerate this. She stated her pain was terrible all week. She took the top layer of the 4 layer compression off. 4/8; the patient actually looks somewhat better in terms of her more prominent left lateral calf wound. There is some healthy looking tissue here. She is still complaining of a lot of discomfort. 4/15; patient in a lot of pain secondary to sciatica. She is on a prednisone taper prescribed by her primary physician. She has the 2 areas one  on the left medial and more recently a smaller area on the left lateral calf. Both of these just above the malleoli 4/22; her back pain is better but she still states she is very uncomfortable and now feels she is intolerant to the [employer]. No real change in the wounds we have been using Sorbact. She has been previously intolerant to Iodoflex. There is not a lot of option about what we can use to debride this wound under compression that she no doubt needs. sHe states Ultram no longer works for her pain 4/29; no major change in the wounds slightly increased depth. Surface on the original medial wound perhaps somewhat improved however the more recent area on the lateral left ankle is 100% covered in very adherent debris we have been using Sorbact. She tolerates 4 layer compression well and her edema control is a lot better. She has not had to come in for a nurse check 5/6; no major change in the condition of the wounds. She did consent to debridement today which was done with some difficulty. Continuing Sorbact. She did not tolerate Iodoflex. She was in for a check of her compression the day after we wrapped her last week this was adjusted but nothing much was found 5/13; no major change in the condition or area of the wounds. I was able to get a fairly aggressive debridement done on the lateral left leg Langenbach, Lawson ([id number]) wound. Even using Sorbact under compression. She came back in on Friday to have the wrap changed. She says she felt uncomfortable on the lateral aspect of her ankle. She has a long history of chronic venous insufficiency including previous ablation surgery on this side. 5/20-Patient returns for wounds on left leg with both wounds covered in slough, with the lateral leg wound larger in size, she has been in 3 layer compression and felt more comfortable, she describes pain in ankle, in leg and pins and needles in foot, and is about to try Pamelor for this 6/3; wounds on  the left lateral and left medial leg. The area medially which is the most recent of the 2 seems to have had the largest increase in dimensions. We have been using Sorbac to try and debride the surface. She has been to see orthopedics they apparently did  a plain x-ray that was indeterminant. Diagnosed her with neuropathy and they have ordered an MRI to determine if there is underlying osteomyelitis. This was not high on my thought list but I suppose it is prudent. We have advised her to make an appointment with vein and vascular in [place]. She has a history of a left greater saphenous and accessory vein ablations I wonder if there is anything else that can be done from a surgical point of view to help in these difficult refractory wounds. We have previously healed this wound on one occasion but it keeps on reopening [medial side] 6/10; deep tissue culture I did last week I think on the left medial wound showed both moderate E. coli and moderate staph aureus [MSSA]. She is going to require antibiotics and I have chosen Augmentin. We have been using Sorbact and we have made better looking wound surface on both sides but certainly no improvement in wound area. She was back in last Friday apparently for a dressing changes the wrap was hurting her outer left ankle. She has not managed to get a hold of vein and vascular in [place]. We are going to have to make her that appointment 6/17; patient is tolerating the Augmentin. She had an MRI that I think was ordered by orthopedic surgeon this did not show osteomyelitis or an abscess did suggest cellulitis. We have been using Sorbact to the lateral and medial ankles. We have been trying to arrange a follow-up appointment with vein and vascular in [place] or did her original ablations. We apparently an area sent the request to vein and vascular in [facility] 6/24; patient has completed the Augmentin. We do not yet have a vein and vascular appointment in  [place]. I am not sure what the issue is here we have asked her to call tomorrow. We are using Sorbact. Making some improvements and especially the medial wound. Both surfaces however look better medial and lateral. 7/1; the patient has been in contact with vein and vascular in [place] but has not yet received an appointment. Using Sorbact we have gradually improve the wound surface with no improvement in surface area. She is approved for Apligraf but the wound surface still is not completely viable. She has not had to come in for a dressing change 7/8; the patient has an appointment with vein and vascular on 7/31 which is a Friday afternoon. She is concerned about getting back here for [place] to dress her wounds. I think it is important to have them goal for her venous reflux/history of ablations etc. to see if anything else can be done. She apparently tested positive for 1 of the blood tests with regards to lupus and saw a rheumatologist. He has raised the issue of vasculitis again. I have had this thought in the past however the evidence seems overwhelming that this is a venous reflux etiology. If the rheumatologist tells me there is clinical and laboratory investigation is positive for lupus I will rethink this. 7/15; the patient's wound surfaces are quite a bit better. The medial area which was her original wound now has no depth although the lateral wound which was the more recent area actually appears larger. Both with viable surfaces which is indeed better. Using Sorbact. I wanted to use Apligraf on her however there is the issue of the vein and vascular appointment on 7/31 at 2:00 in the afternoon which would not allow her to get back to be rewrapped and they would no doubt remove  the graft 7/22; the patient's wound surfaces have moderate amount of debris although generally look better. The lateral one is larger with 2 small satellite areas superiorly. We are waiting for her vein and  vascular appointment on 7/31. She has been approved for Apligraf which I would like to use after th 7/29; wound surfaces have improved no debridement is required we have been using Sorbact. She sees vein and vascular on Friday with this so question of whether anything can be done to lessen the likelihood of recurrence and/or speed the healing of these areas. She is already had previous ablations. She no doubt has severe venous hypertension 8/5-Patient returns at 1 week, she was in [name] for 3 days by her podiatrist, we have been using so backed to the wound, she has increased pain in both the wounds on the left lower leg especially the more distal one on the lateral aspect 8/12-Patient returns at 1 week and she is agreeable to having debridement in both wounds on her left leg today. We have been using Sorbact, and vascular studies were reviewed at last visit 8/19; the patient arrives with her wounds fairly clean and no debridement is required. We have used Sorbact which is really done a nice job in cleaning up these very difficult wound surfaces. The patient saw Dr. Donzetta Matters of vascular surgery on 7/31. He did not feel that there was an arterial component. He felt that her treated greater saphenous vein is adequately addressed and that the small saphenous vein did not appear to be involved significantly. She was also noted to have deep venous reflux which is not treatable. Dr. Donzetta Matters mentioned the possibility of a central obstructive component leading to reflux and he offered her central venography. She wanted to discuss this or think about it. I have urged her to go ahead with this. She has had recurrent difficult wounds in these areas which do heal but after months in the clinic. If there is anything that can be done to reduce the likelihood of this I think it is worth it. 9/2 she is still working towards getting follow-up with Dr. Donzetta Matters to schedule her CT. Things are quite a bit worse venography. I  put Apligraf on 2 weeks ago on both wounds on the medial and lateral part of her left lower leg. She arrives in clinic today with 3 superficial additional wounds above the area laterally and one below the wound medially. She describes a lot of discomfort. I think these are probably wrapped injuries. Does not look like she has cellulitis. 07/20/2019 on evaluation today patient appears to be doing somewhat poorly in regard to her lower extremity ulcers. She in fact showed signs of erythema in fact we may even be dealing with an infection at this time. Unfortunately I am unsure if this is just infection or if indeed there may be some allergic reaction that occurred as a result of the Apligraf application. With that being said that would be unusual but nonetheless not impossible in this patient is one who is unfortunately allergic to quite a bit. Currently we have been using the Sorbact which seems to do as well as anything for her. I do think we may want to obtain a culture today to see if there is anything showing up there that may need to be addressed. 9/16; noted that last week the wounds look worse in 1 week follow-up of the Apligraf. Using Sorbact as of 2 days ago. She arrives with copious amounts  of drainage and new skin breakdown on the back of the left calf. The wounds arm more substantial bilaterally. There is a fair amount of swelling in the left calf no overt DVT there is edema present I think in the left greater than right thigh. She is supposed to go on 9/28 for CT venography. The wounds on the medial and lateral calf are worse and she has new skin breakdown posteriorly at least new for me. This is almost developing into a circumferential wound area The Apligraf was taken off last week which I agree with things are not going in the right direction a culture was done we do not have that back yet. She is on Augmentin that she started 2 days ago 9/23; dressing was changed by her nurses on  Monday. In general there is no improvement in the wound areas although the area looks less angry than last week. She did get Augmentin for MSSA cultured on the 14th. She still appears to have too much swelling in the left leg even with 3 layer compression 9/30; the patient underwent her procedure on 9/28 by Dr. Donzetta Matters at vascular and vein specialist. She was discovered to have the common iliac vein measuring 12.2 mm but at the level of L4-L5 measured 3 mm. After stenting it measured 10 mm. 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 Rouleaux flow in the common femoral and femoral vein was observed much improved after stenting. We are using silver alginate to the wounds on the medial and lateral ankle on the left. 4 layer compression 10/7; the patient had fluid swelling around her knee and 4 layer compression. At the advice of vein and vascular this was reduced to 3 layer which she is tolerating better. We have been using silver alginate under 3 layer compression since last Friday DOROTHIA, PASSMORE ([id number]) 10/14; arrives with the areas on the left ankle looking a lot better. Inflammation in the area also a lot better. She came in for a nurse check on 10/9 10/21; continued nice improvement. Slight improvements in surface area of both the medial and lateral wounds on the left. A lot of the satellite lesions in the weeping erythema around these from stasis dermatitis is resolved. We have been using silver alginate 10/28; general improvement in the entire wound areas although not a lot of change in dimensions the wound certainly looks better. There is a lot less in terms of venous inflammation. Continue silver alginate this week however look towards Hydrofera Blue next week 11/4; very adherent debris on the medial wound left wound is not as bad. We have been using silver alginate. Change to [facility] today 11/11; very adherent debris on both wound areas. She went to vein and vascular  last week and follow-up they put in [place] boot on this today. He says the [facility] was adherent. Wound is definitely not as good as last week. Especially on the left there the satellite lesions look more prominent 11/18; absolutely no better. erythema on lateral aspect with tenderness. 09/30/2019 on evaluation today patient appears to actually be doing better. Dr. Dellia Nims did put her on doxycycline last week which I do believe has helped her at this point. Fortunately there is no signs of active infection at this time. No fevers, chills, nausea, vomiting, or diarrhea. I do believe he may want extend the doxycycline for 7 additional days just to ensure everything does completely cleared up the patient is in agreement with that plan.  Otherwise she is going require some sharp debridement today 12/2; patient is completing a 2-week course of doxycycline. I gave her this empirically for inflammation as well as infection when I last saw her 2 weeks ago. All of this seems to be better. She is using silver alginate she has the area on the medial aspect of the larger area laterally and the 2 small satellite regions laterally above the major wound. 12/9; the patient's wound on the left medial and left lateral calf look really quite good. We have been using silver alginate. She saw vein and vascular in follow-up on 10/09/2019. She has had a previous left greater saphenous vein ablation by Dr. Oscar La in 2016. More recently she underwent a left common iliac vein stent by Dr. 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 The swelling is improved and certainly the wounds have improved. The patient shows [place] today area on the right medial calf there is almost no wound but leaking lymphedema. She says she start this started 3 or 4 days ago. She did not traumatize it. It is not painful. She does not wear compression on that side 12/16; the patient continues to do well laterally. Medially still requiring  debridement. The area on the right calf did not materialize to anything and is not currently open. We wrapped this last time. She has support stockings for that leg although I am not sure they are going to provide adequate compression 12/23; the lateral wound looks stable. Medially still requiring debridement for tightly adherent fibrinous debris. We've been using silver alginate. Surface area not any different 12/30; neither wound is any better with regards to surface and the area on the left lateral is larger. I been using silver alginate to the left lateral which look quite good last week and Sorbact to the left medial 11/11/2019. Lateral wound area actually looks better and somewhat smaller. Medial still requires a very aggressive debridement today. We have been using Sorbact on both wound areas 1/13; not much better still adherent debris bilaterally. I been using Sorbact. She has severe venous hypertension. Probably some degree of dermal fibrosis distally. I wonder whether tighter compression might help and I am going to try that today. We also need to work on the bioburden 1/20; using Sorbact. She has severe venous hypertension status post stent placement for pelvic vein compression. We applied gentamicin last time to see if we could reduce bioburden I had some discussion with her today about the use of pentoxifylline. This is occasionally used in this setting for wounds with refractory venous insufficiency. However this interacts with Plavix. She tells me that she was put on this after stent placement for 3 months. She will call Dr. Claretha Cooper office to discuss 1/27; we are using gentamicin under Sorbact. 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 She has a stent. Wound medially is measuring smaller this week. Laterally measuring slightly larger although she has some satellite lesions superiorly 2/3; gentamicin under Sorbact under 4-layer compression. She has severe  venous hypertension with may Thurner pathophysiology. She has a stent on Plavix. Her wounds are measuring smaller this week. More substantially laterally where there is a satellite lesion superiorly. 2/10; gentamicin under Sorbac. 4-layer compression. Patient communicated with Dr. Donzetta Matters at vein and vascular in [place]. He is okay with the patient coming off Plavix I will therefore start her on pentoxifylline for a 1 month trial. In general her wounds look better today. I had some concerns about swelling in  the left thigh however she measures 61.5 on the right and 63 on the mid thigh which does not suggest there is any difficulty. The patient is not describing any pain. 2/17; gentamicin under Sorbac 4-layer compression. She has been on pentoxifylline for 1 week and complains of loose stool. No nausea she is eating and drinking well 2/24; the patient apparently came in 2 days ago for a nurse visit when her wrap fell down. Both areas look a little worse this week macerated medially and satellite lesions laterally. Change to silver alginate today 3/3; wounds are larger today especially medially. She also has more swelling in her foot lower leg and I even noted some swelling in her posterior thigh which is tender. I wonder about the patency of her stent. Fortuitously she sees Dr. Claretha Cooper group on Friday 3/10; Mrs. Ports was seen by vein and vascular on 3/5. The patient underwent ultrasound. There was no evidence of thrombosis involving the IVC no evidence of thrombosis involving the right common iliac vein there is no evidence of thrombosis involving the right external iliac vein the left external vein is also patent. The right common iliac vein stent appears patent bilateral common femoral veins are compressible and appear patent. I was concerned about the left common iliac stent however it looks like this is functional. She has some edema in the posterior thigh that was tender she still has that this  week. I also note they had trouble finding the pulses in her left foot and booked her for an ABI baseline in 4 weeks. She will follow up in 6 months for repeat IVC duplex. The patient stopped the pentoxifylline because of diarrhea. It does not look like that was being effective in any case. I have advised her to go back on her aspirin 81 mg tablet, vascular it also suggested this 3/17; comes in today with her wound surfaces a lot better. The excoriations from last week considerably better probably secondary to the TCA. We have been using silver alginate 3/24; comes in today with smaller wounds both medially and laterally. Both required debridement. There are 2 small satellite areas superiorly laterally. She also has a very odd bandlike area in the mid calf almost looking like there was a weakness in the wrap in a localized area. I would write this off as being this however anteriorly she has a small raised ballotable area that is very tender almost reminiscent of an abscess but there was no obvious purulent surface to it. [name], [name] ([id number]) 02/04/20 upon evaluation today patient appears to be doing fairly well in regard to her wounds today. Fortunately there is no signs of active infection at this time. No fevers, chills, nausea, vomiting, or diarrhea. She has been tolerating the dressing changes without complication. Fortunately I feel like she is showing signs of improvement although has been sometime since have seen her. Nonetheless the area of concern that Dr. Dellia Nims had last week where she had possibly an area of the wrap that was we can allow the leg to bulge appears to be doing significantly better today there is no signs of anything worsening. 4/7; the patient's wounds on her medial and lateral left leg continue to contract. We have been using a regular alginate. Last week she developed an area on the right medial lower leg which is probably a venous ulcer as well. 4/14; the  wounds on her left medial and lateral lower leg continue to contract. Surface eschar. We have been using  regular alginate. The area on the right medial lower leg is closed. We have been putting both legs under 4-layer contraction. The patient went back to see vein and vascular she had arterial studies done which were apparently "quite good" per the patient although I have not read their notes I have never felt she had an arterial issue. 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 This is been longstanding and refractory to exercise, leg elevation and longstanding use of compression wraps in our clinic as well as compression stockings on the times we have been able to get these to heal 4/21; we thought she actually might be close this week however she arrives in clinic with a lot of edema in her upper left calf and into her posterior thigh. This is been an intermittent problem here. She says the wrap fell down but it was replaced with a nurse visit on Monday. We are using calcium alginate to the wounds and the wound sizes there not terribly larger than last week but there is a lot more edema 4/28; again wound edges are smaller on both sides. Her edema is better controlled than last time. She is obtained her compression pumps from [employer] although they have not been to her home to set these up. 5/5; left medial and left lateral both look stable. I am not sure the medial is any smaller. We have been using calcium alginate under 4-layer compression. She had an area on the right medial. This was eschared today. We have been wrapping this as well. She does not tolerate external compression stockings due to a history of various contact allergies. She has her compression pumps however the representative from the company is coming on her to show her how to use these tomorrow 5/19; patient with severe chronic venous insufficiency secondary to central  venous disease. She had a stent placed in her left common iliac vein. She has done better since but still difficult to control wounds. She comes in today with nothing open on the right leg. Her areas on the left medial and left lateral are just about closed. We are using calcium alginate under 4-layer compression. She is using her external compression pumps at home She only has 15-20 support stockings. States she cannot get anything tighter than that on. 03/30/20-Patient returns at 1 week, the wounds on the left leg are both slightly bigger, the last week she was on 3 layer compression which started to slide down. She is starting to use her lymphedema pumps although she stated on 1 day her right ankle started to swell up and she have to stop that day. Unfortunately the open area seem to oscillate between improving to the point of healing and then flaring up all to do with effectiveness of compression or lack of due to the left leg topography not keeping the compression wraps from rolling down 6/2 patient comes in with a 15/20 mmHg stocking on the right leg. She tells me that she developed a lot of swelling in her ankles she saw orthopedics she was felt to possibly be having a flare of pseudogout versus some other type of arthritis. She was put on steroids for a respiratory issue so that helps with the inflammation. She has not been using the pumps all week. She thinks the left thigh is more swollen than usual and I would agree with that. She has an appointment with Dr. Donzetta Matters 9 days or so from now 6/9; both wounds on  the left medial and left lateral are smaller. We have been using calcium alginate under compression. She does not have an open wound on the right leg she is using a stocking and her compression pumps things are going well. She has an appointment with Dr. Donzetta Matters with regards to her stent in the left common iliac vein 6/16; the wounds on the left medial and left lateral ankle continues to  contract. The patient saw Dr. Donzetta Matters and I think he seems satisfied. Ordered follow-up venous reflux studies on both sides in September. Cautioned that she may need thigh-high stockings. She has been using calcium alginate under compression on the left and her own stocking on the right leg. She tells [place] there are no open wounds on the right 6/23; left lateral is just about closed. Medial required debridement today. We have been using calcium alginate. Extensive discussion about the compression pumps she is only using these on 25 mmHg states she could not take 40 or 30 when the wrap came out to her home to demonstrate these. He said they should not feel tight 6/30; the left lateral wound has a slight amount of eschar. . The area medially is about the same using Hydrofera Blue. 7/7; left lateral wound still has some eschar. I will remove this next week may be closed. The area medially is very small using Hydrofera Blue with improvement. Unfortunately the stockings fell down. Unfortunately the blisters have developed at the edge of where the wrap fell. When this happened she says her legs hurt she did not use her pumps. We are not open Monday for her to come in and change the wraps and she had an appointment yesterday. She also tells me that she is going to have an MRI of her back. She is having pain radiating into her left anterior leg she thinks her from an L5 disc. She saw Dr. Ellene Route of neurosurgery 7/14; the area on the left lateral ankle area is closed. Still a small area medially however it looks better as well. We have been using Hydrofera Blue under 4-layer compression 7/21; left lateral ankle is still closed however her wound on the medial left calf is actually larger. This is probably because Hydrofera Blue got stuck to the wound. She came in for a nurse change on Friday and will do that again this week I was concerned about the amount of swelling that she had last week however she is using her  compression pumps twice a day and the swelling seems well controlled 7/28; remaining wound on the left medial lower leg is smaller. We have been using moistened silver collagen under compression she is coming back for a nurse visit. For reasons that were not really clear she was just keeping her legs elevated and not using her compression pumps. I have asked her to use the compression pumps. She does not have any wounds on the right leg 06/15/20-Patient returns at 2 weeks, her LLE edema is worse and she developed a blister wound that is new and has bigger posterior calf wound on right, we are using Prisma with pad, 4 layer compression. she has been on lasix 40 mg daily 8/18; patient arrives today with things a lot worse than I remember from a few weeks ago. She was seen last week. Noted that her edema was worse and that she now had a left lateral wound as well as deteriorating edema in the medial and posterior part of the lower leg. She says she  is using his or her external compression pumps once a day although I wonder about the compliance. 8/25; weeping area on the right medial lower leg. This had actually gotten a small localized area of her compression stocking wet. On the left side there is a large denuded area on the posterior medial lower leg and smaller area on the lateral. This was not the original areas that we dealt with. 9/1 the patient's wound on the left leg include the left lateral and left posterior. Larger superficial wounds weeping. She has very poor edema control. Tender localized edema in the left lower medial ankle/heel probably because of localized wrap issues. She freely admits she is not using the compression pumps. She has been up on her feet a lot. She thinks the hydrofera blue is contributing to the pain she is experiencing.. This is a complaint that I have occasionally heard [name], [name] ([id number]) 9/8; really not much improvement. The patient is still complaining of  a lot of pain particularly when she uses compression pumps. I switched her to silver alginate last time because she found the Hydrofera Blue to be irritating. I don't hear much difference in her description with the silver alginate. 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 She has follow-up with Dr. Donzetta Matters I think for ultrasound next month 9/15; quite a bit of improvement today. We have less edema and more epithelialization in both of her wound areas on the left medial and left lateral calf. These are not the site of her original wounds in this area. She says she has been using her compression pumps for 30 minutes twice a day, there pain issues that never quite understood. Silver alginate as the primary dressing 9/22; continued improvement. Both areas medially and laterally still have a small open area there is some eschar. She continues to complain of left medial ankle pain. Swelling in the leg is in much better condition. We have been using silver alginate 9/29; continued improvement. Both areas medially and laterally in the left calf look as though they are close some minor surface eschar but I think this is epithelialized. She comes in today saying she has a ruptured disc at L4-L5 cannot bend over to put on her stockings. 10/6; patient comes in today with no open wounds on either leg. However her edema on the left leg in the upper one third of the lower leg is poorly controlled nonpitting. She says that she could not use the pumps for 2 days and then she has been using the last couple of days. It is not clear to me she has been able to get her stocking on. She has back problems. Mrs. [name] has severe chronic venous insufficiency with secondary lymphedema. Her venous insufficiency is partially centrally mediated and that she is now post stent in the left common iliac vein. [facility] Thurner's syndrome/physiology]. She  follows up with them on 10/15. She wears 20/30 below-knee stockings. She is supposed to use compression pumps at home although I think her compliance about with this is been less than 100%. I have asked her to use these 3 times a day. Finally I think she has lipodermatosclerosis in the left lower leg with an inverted bottle sign. It is been a major problem controlling the edema in the left leg. The right leg we have had wounds on but not as significant a problem is on the left  READMISSION 04/12/2021 Mrs. Garay is a 76 year old woman we know well in this clinic. She has severe chronic venous insufficiency. 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 I believe she has had bilateral greater saphenous vein ablation in the past as well. She tells me that this wound opened sometime in March. She had a fall and thinks it was initially abrasion. She developed areas she describes as little blisters on the anterior part of her leg and she saw dermatology and was treated for methicillin staph aureus with several rounds of antibiotics. She has been using support stockings on the left leg and says this is the only thing she can get on. Her compression pump use maybe once a day she says if she did not use one she use the other. She comes in today with incredible swelling in the left leg with a wound on the left posterior calf. She has been using Neosporin to this previously a hydrocolloid. 6/15; patient arrives back for 1 week follow-up.Marland Kitchen Apparently her wrap fell down she did not call us to replace this. He has poor edema control. She only uses her compression pumps once a day 6/29; patient presents for 1 week follow-up. She has tolerated the compression wrap well. We have been using Iodoflex under the wrap. She has no issues or complaints today. 7/6; patient presents for 1 week follow-up. She states that the compression wrap rolled down her leg 4 days ago. She has been  trying to keep the area covered but has no dressings at home to use. She denies signs of infection. 7/13; patient presents for 1 week follow-up. She states that her compression wrap rolled down her right leg and she called our office and had it placed a few days ago. She has been tolerating the current wrap well. She states that the Iodoflex is causing a burning sensation. She denies signs of infection. 7/27; patient presents for 1 week follow-up She has tolerated the compression wrap well with Hydrofera Blue underneath. She has now developed a wound to her right lower extremity. She reports having a culture done To this area by her dermatologist that she reports is negative. She currently denies signs of infection. 8/30; patient presents for 1 week follow-up. On the left side she has tolerated the compression wrap well with Hydrofera Blue underneath. She reports some discomfort to her right lower extremity with the 3 layer compression. She currently denies signs of infection. 06/14/2021 upon evaluation today patient's wound actually appears to be doing decently well based on what I am seeing currently. She actually has 2 areas 1 on the left distal/posterior lower leg and the other on the right lower leg. Subsequently the measurements are roughly the same may be slightly smaller but in general have not made any trend towards getting worse which is great news. 06/23/2021 upon evaluation today patient appears to be doing pretty well in regard to her wounds. On the right she is having difficulty with sciatic pain and this subsequently has led to her taking the wrap off over the last week her leg is more swollen she is also been on prednisone for 10 days. With that being said I think we may want to just use a compression sock on the left that way she will not have to worry about the compression wrap being in place that she cannot get off. With that being said I do believe as well that the patient is going  to need to continue with the wrapping on the left we will also need to do a little bit of sharp debridement today. 8/24; patient presents for 1 week follow-up. She has no issues or complaints today. She denies signs of infection. 8/31; patient presents for 1 week follow-up. She has no issues or complaints today. She has tolerated the compression wrap well on the left lower extremity. She denies signs of infection. 9/7; patient presents for follow-up. She reports that the compression wrap rolled down slightly on her right lower extremity. She reports tenderness to the wound bed. She denies increased warmth or erythema to the surrounding skin. She overall feels well. 9/14; patient presents for follow-up. She has no issues or complaints today. She denies signs of infection. PuraPly is available and patient would like to start this today. 9/21; patient presents for follow-up. She has no issues or complaints today. She tolerated the first skin substitute placement last week under compression. 9/28; patient presents for 1 week follow-up. She has no issues or complaints today. 10/5; patient presents for 1 week follow-up. She reports taking the wrap off yesterday. She was on her feet more yesterday and developed more swelling to her left lower extremity. She denies pain. Overall she is doing well. [name], [name] ([id number]) 10/12; patient presents for 1 week follow-up. She has no issues or complaints today. 10/19; patient presents for follow-up. She has no issues or complaints today. She tolerated the compression wrap well. She states she has compression stockings at home. Readmission: 11/27/2021 upon evaluation today patient appears to be doing poorly in regard to the wounds on her right leg. She also has an area of erythema in the proximal calf area of the right leg which again I do believe is a issue from the standpoint of it being warm and erythematous to touch and also seems to be somewhat  localized I really think this may be more of a cellulitis issue than anything else. Fortunately I do not see any signs of active infection Systemically at this time. 12/05/2021 upon evaluation patient's wounds actually appear to be showing some signs of improvement which is not too bad and just 1 week's time. Especially since we had to get on the right antibiotic which is only been for the past 4 days. Fortunately I do not see any signs of active infection locally nor systemically at this time that has gotten any worse. Overall I think that again with the antibiotic she is significantly improved and currently she is taking Levaquin. 12/11/2021 upon evaluation today patient appears to be doing well with regard to her leg. Between the antibiotics and the triamcinolone I feel like she is doing significantly better which is great news. Overall I am extremely pleased with where we stand. There does not appear to be any signs of active infection locally or systemically at this time. 12/18/2021 upon evaluation today patient appears to be doing well with regard to her wound. I do feel like this is showing signs of some improvement here. Little by little this is clearing up quite nicely. I think it is just a slow process but nonetheless 1 that we are seeing evidence of improvement with regard to. 12/25/2021 upon evaluation today patient appears to be doing well with regard to her wounds she is definitely making some good progress here. Fortunately I do not see any signs of active infection locally or systemically at this time which is great news. No fevers, chills, nausea, vomiting, or diarrhea. Objective  Constitutional [name] and well-hydrated in no acute distress. Vitals Time Taken: 3:15 PM, Height: 62 in, Weight: 199 lbs, BMI: 36.4, Temperature: 97.8 F, Pulse: 86 bpm, Respiratory Rate: 18 breaths/min, Blood Pressure: 171/73 mmHg. Respiratory normal breathing without difficulty. Psychiatric this  patient is able to make decisions and demonstrates good insight into disease process. Alert and Oriented x 3. pleasant and cooperative. General Notes: Upon inspection patient's wound actually is showing signs of significant improvement in fact we have developed more into the medial wound and the posterior wound and I think we will get a go ahead and relabel those today as well. Otherwise I feel like the patient is actually doing quite well based on what I am seeing currently. Integumentary (Hair, Skin) Wound #15 status is Open. Original cause of wound was Gradually Appeared. The date acquired was: 10/27/2021. The wound has been in treatment 4 weeks. The wound is located on the Right,Posterior Lower Leg. The wound measures 6cm length x 13.5cm width x 0.1cm depth; 63.617cm^2 area and 6.362cm^3 volume. There is Fat Layer (Subcutaneous Tissue) exposed. There is no tunneling or undermining noted. There is a large amount of serosanguineous drainage noted. The wound margin is indistinct and nonvisible. There is medium (34-66%) pink, pale granulation within the wound bed. There is a medium (34-66%) amount of necrotic tissue within the wound bed including Adherent Slough. Wound #16 status is Open. Original cause of wound was Gradually Appeared. The date acquired was: 10/26/2021. The wound is located on the Right,Medial Lower Leg. The wound measures 1.5cm length x 1.4cm width x 0.1cm depth; 1.649cm^2 area and 0.165cm^3 volume. There is Fat Layer (Subcutaneous Tissue) exposed. There is no tunneling or undermining noted. There is a medium amount of serosanguineous drainage noted. There is medium (34-66%) red, pink, hyper - granulation within the wound bed. There is a medium (34-66%) amount of necrotic tissue within the wound bed including Adherent Slough. Assessment PATRIC, VANPELT ([id number]) Active Problems ICD-10 Lymphedema, not elsewhere classified Chronic venous hypertension (idiopathic) with ulcer and  inflammation of right lower extremity Non-pressure chronic ulcer of other part of right lower leg limited to breakdown of skin Procedures Wound #15 Pre-procedure diagnosis of Wound #15 is a Lymphedema located on the Right,Posterior Lower Leg . There was a Three Layer Compression Therapy Procedure by Levora Dredge, RN. Post procedure Diagnosis Wound #15: Same as Pre-Procedure Plan Follow-up Appointments: Return Appointment in 1 week. Nurse Visit as needed Bathing/ Shower/ Hygiene: May shower with wound dressing protected with water repellent cover or cast protector. Edema Control - Lymphedema / Segmental Compressive Device / Other: Optional: One layer of unna paste to top of compression wrap (to act as an anchor). Elevate, Exercise Daily and Avoid Standing for Long Periods of Time. Elevate leg(s) parallel to the floor when sitting. DO YOUR BEST to sleep in the bed at night. DO NOT sleep in your recliner. Long hours of sitting in a recliner leads to swelling of the legs and/or potential wounds on your backside. WOUND #15: - Lower Leg Wound Laterality: Right, Posterior Cleanser: Soap and Water Discharge Instructions: Gently cleanse wound with antibacterial soap, rinse and pat dry prior to dressing wounds Topical: Triamcinolone Acetonide Cream, 0.1%, 15 (g) tube Discharge Instructions: Apply as directed by provider. apply to wound and leg Primary Dressing: Xtrasorb Classic Super Absorbent Dressing, 6x9 (in/in) Discharge Instructions: over open areas of the leg that are weeping Compression Wrap: Profore Lite LF 3 Multilayer Compression Bandaging System Discharge Instructions: Apply 3 multi-layer wrap as  prescribed. WOUND #16: - Lower Leg Wound Laterality: Right, Medial Cleanser: Soap and Water Discharge Instructions: Gently cleanse wound with antibacterial soap, rinse and pat dry prior to dressing wounds Topical: Triamcinolone Acetonide Cream, 0.1%, 15 (g) tube Discharge Instructions:  Apply as directed by provider. apply to wound and leg Primary Dressing: Xtrasorb Classic Super Absorbent Dressing, 6x9 (in/in) Discharge Instructions: over open areas of the leg that are weeping Compression Wrap: Profore Lite LF 3 Multilayer Compression Bandaging System Discharge Instructions: Apply 3 multi-layer wrap as prescribed. 1. Would recommend currently that we going continue with the wound care measures as before and the patient is in agreement with the plan. This includes the use of the triamcinolone to the wound itself. Subsequently were using the XtraSorb over top to catch any drainage. 2. I am also can recommend that we continue with the 3 layer compression wrap which I think is doing a good job. 3. I would also recommend she continue to elevate her legs much as possible also gave her information for the [employer] in order to help with being able to fit her wraps into her shoes. We will see patient back for reevaluation in 1 week here in the clinic. If anything worsens or changes patient will contact our office for additional recommendations. Electronic Signature(s) Signed: 12/25/2021 4:59:52 PM By: Worthy Keeler PA-C Previous Signature: 12/25/2021 3:01:12 PM Version By: Worthy Keeler PA-C Entered By: Worthy Keeler on 12/25/2021 16:59:51 [name], [name] ([id number]) -------------------------------------------------------------------------------- SuperBill Details Patient Name: [name], [name]. Date of Service: 12/25/2021 Medical Record Number: [id number] Patient Account Number: [id number] Date of Birth/Sex: [birth year] (76 y.o. F) Treating RN: Levora Dredge Primary Care Provider: [name] Other Clinician: Referring Provider: [name] Treating Provider/Extender: [name] in Treatment: 4 Diagnosis Coding ICD-10 Codes Code Description I89.0 Lymphedema, not elsewhere classified I87.331 Chronic venous hypertension (idiopathic) with ulcer and  inflammation of right lower extremity L97.811 Non-pressure chronic ulcer of other part of right lower leg limited to breakdown of skin Facility Procedures CPT4 Code: [id number] Description: (Facility Use Only) [phone] - Al[facility] LWR RT LEG Modifier: Quantity: 1 Physician Procedures CPT4 Code Description: [id number] - WC PHYS LEVEL 4 - EST PT Modifier: Quantity: 1 CPT4 Code Description: ICD-10 Diagnosis Description I89.0 Lymphedema, not elsewhere classified I87.331 Chronic venous hypertension (idiopathic) with ulcer and inflammation of rig L97.811 Non-pressure chronic ulcer of other part of right lower leg limited  to brea Modifier: ht lower extremit kdown of skin Quantity: y Engineer, maintenance) Signed: 12/25/2021 4:28:09 PM By: Levora Dredge Signed: 12/25/2021 5:14:22 PM By: Worthy Keeler PA-C Previous Signature: 12/25/2021 3:01:51 PM Version By: Worthy Keeler PA-C Previous Signature: 12/25/2021 3:01:26 PM Version By: Worthy Keeler PA-C Entered By: Levora Dredge on 12/25/2021 16:28:09

## 2021-12-25 NOTE — Progress Notes (Addendum)
DEVANN, CRIBB ([id number]) Visit Report for 12/25/2021 Arrival Information Details Patient Name: [name], [name]. Date of Service: 12/25/2021 2:15 PM Medical Record Number: [id number] Patient Account Number: [id number] Date of Birth/Sex: 30-Oct-1946 (76 y.o. F) Treating RN: Levora Dredge Primary Care Deran Barro: [name] Other Clinician: Referring Aniela Caniglia: [name] Treating Elad Macphail/Extender: [name] in Treatment: 4 Visit Information History Since Last Visit Added or deleted any medications: No Patient Arrived: Ambulatory Had a fall or experienced change in No Arrival Time: 14:16 activities of daily living that may affect Accompanied By: self risk of falls: Transfer Assistance: None Hospitalized since last visit: No Patient Identification Verified: Yes Has Dressing in Place as Prescribed: Yes Secondary Verification Process Completed: Yes Has Compression in Place as Prescribed: Yes Patient Requires Transmission-Based No Pain Present Now: No Precautions: Patient Has Alerts: Yes Patient Alerts: Patient on Blood Thinner 81mg  aspirin Electronic Signature(s) Signed: 12/25/2021 4:31:00 PM By: Levora Dredge Entered By: Levora Dredge on 12/25/2021 14:17:19 Lopezperez, [name] ([id number]) -------------------------------------------------------------------------------- Clinic Level of Care Assessment Details Patient Name: [name]. Date of Service: 12/25/2021 2:15 PM Medical Record Number: [id number] Patient Account Number: [id number] Date of Birth/Sex: Mar 30, 1946 (76 y.o. F) Treating RN: Levora Dredge Primary Care Kimbria Camposano: [name] Other Clinician: Referring Khadeejah Castner: [name] Treating Caeson Filippi/Extender: [name] in Treatment: 4 Clinic Level of Care Assessment Items TOOL 1 Quantity Score []  - Use when EandM and Procedure is performed on INITIAL visit 0 ASSESSMENTS - Nursing Assessment / Reassessment []  - General  Physical Exam (combine w/ comprehensive assessment (listed just below) when performed on new 0 pt. evals) []  - 0 Comprehensive Assessment (HX, ROS, Risk Assessments, Wounds Hx, etc.) ASSESSMENTS - Wound and Skin Assessment / Reassessment []  - Dermatologic / Skin Assessment (not related to wound area) 0 ASSESSMENTS - Ostomy and/or Continence Assessment and Care []  - Incontinence Assessment and Management 0 []  - 0 Ostomy Care Assessment and Management (repouching, etc.) PROCESS - Coordination of Care []  - Simple Patient / Family Education for ongoing care 0 []  - 0 Complex (extensive) Patient / Family Education for ongoing care []  - 0 Staff obtains Programmer, systems, Records, Test Results / Process Orders []  - 0 Staff telephones HHA, Nursing Homes / Clarify orders / etc []  - 0 Routine Transfer to another Facility (non-emergent condition) []  - 0 Routine Hospital Admission (non-emergent condition) []  - 0 New Admissions / Biomedical engineer / Ordering NPWT, Apligraf, etc. []  - 0 Emergency Hospital Admission (emergent condition) PROCESS - Special Needs []  - Pediatric / Minor Patient Management 0 []  - 0 Isolation Patient Management []  - 0 Hearing / Language / Visual special needs []  - 0 Assessment of Community assistance (transportation, D/C planning, etc.) []  - 0 Additional assistance / Altered mentation []  - 0 Support Surface(s) Assessment (bed, cushion, seat, etc.) INTERVENTIONS - Miscellaneous []  - External ear exam 0 []  - 0 Patient Transfer (multiple staff / Civil Service fast streamer / Similar devices) []  - 0 Simple Staple / Suture removal (25 or less) []  - 0 Complex Staple / Suture removal (26 or more) []  - 0 Hypo/Hyperglycemic Management (do not check if billed separately) []  - 0 Ankle / Brachial Index (ABI) - do not check if billed separately Has the patient been seen at the hospital within the last three years: Yes Total Score: 0 Level Of Care: ____ [name]  ([id number]) Electronic Signature(s) Signed: 12/25/2021 4:31:00 PM By: Levora Dredge Entered By: Levora Dredge on 12/25/2021 16:27:57 Chhim, Norina J. ([id number]) -------------------------------------------------------------------------------- Compression  Therapy Details Patient Name: [name]. Date of Service: 12/25/2021 2:15 PM Medical Record Number: [id number] Patient Account Number: [id number] Date of Birth/Sex: May 17, 1946 (76 y.o. F) Treating RN: Levora Dredge Primary Care Maille Halliwell: [name] Other Clinician: Referring Shanvi Moyd: [name] Treating Armoni Kludt/Extender: [name] in Treatment: 4 Compression Therapy Performed for Wound Assessment: Wound #15 Right,Posterior Lower Leg Performed By: Clinician Levora Dredge, RN Compression Type: Three Layer Post Procedure Diagnosis Same as Pre-procedure Electronic Signature(s) Signed: 12/25/2021 4:27:10 PM By: Levora Dredge Entered By: Levora Dredge on 12/25/2021 16:27:10 Kaneko, [name] ([id number]) -------------------------------------------------------------------------------- Encounter Discharge Information Details Patient Name: [name]. Date of Service: 12/25/2021 2:15 PM Medical Record Number: [id number] Patient Account Number: [id number] Date of Birth/Sex: Jun 17, 1946 (76 y.o. F) Treating RN: Levora Dredge Primary Care Dera Vanaken: [name] Other Clinician: Referring Demir Titsworth: [name] Treating Antrell Tipler/Extender: [name] in Treatment: 4 Encounter Discharge Information Items Discharge Condition: Stable Ambulatory Status: Ambulatory Discharge Destination: Home Transportation: Other Accompanied By: self Schedule Follow-up Appointment: Yes Clinical Summary of Care: Electronic Signature(s) Signed: 12/25/2021 4:29:14 PM By: Levora Dredge Entered By: Levora Dredge on 12/25/2021 16:29:14 Deady, [name]  ([id number]) -------------------------------------------------------------------------------- Lower Extremity Assessment Details Patient Name: [name]. Date of Service: 12/25/2021 2:15 PM Medical Record Number: [id number] Patient Account Number: [id number] Date of Birth/Sex: [birth year] (76 y.o. F) Treating RN: Levora Dredge Primary Care Juana Montini: [name] Other Clinician: Referring Jhanvi Drakeford: [name] Treating Joseth Weigel/Extender: Jeri Cos Weeks in Treatment: 4 Edema Assessment Assessed: [Left: No] [Right: No] Edema: [Left: Ye] [Right: s] Calf Left: Right: Point of Measurement: From Medial Instep 40.4 cm Ankle Left: Right: Point of Measurement: From Medial Instep 23 cm Vascular Assessment Pulses: Dorsalis Pedis Palpable: [Right:Yes] Posterior Tibial Palpable: [Right:Yes] Electronic Signature(s) Signed: 12/25/2021 4:31:00 PM By: Levora Dredge Entered By: Levora Dredge on 12/25/2021 14:29:03 Mandelbaum, [name] ([id number]) -------------------------------------------------------------------------------- Multi Wound Chart Details Patient Name: LIYAT, FAULKENBERRY. Date of Service: 12/25/2021 2:15 PM Medical Record Number: [id number] Patient Account Number: [id number] Date of Birth/Sex: 1946/07/24 (76 y.o. F) Treating RN: Levora Dredge Primary Care Lorance Pickeral: [name] Other Clinician: Referring Alizzon Dioguardi: [name] Treating Marilla Boddy/Extender: [name] in Treatment: 4 Vital Signs Height(in): 62 Pulse(bpm): 86 Weight(lbs): 199 Blood Pressure(mmHg): 171/73 Body Mass Index(BMI): 36.4 Temperature(F): 97.8 Respiratory Rate(breaths/min): 18 Photos: [N/A:N/A] Wound Location: Right, Circumferential Lower Leg N/A N/A Wounding Event: Gradually Appeared N/A N/A Primary Etiology: Lymphedema N/A N/A Secondary Etiology: Venous Leg Ulcer N/A N/A Comorbid History: Cataracts, Asthma, Sleep Apnea, N/A N/A Deep Vein Thrombosis, Hypertension,  Peripheral Venous Disease, Osteoarthritis, Received Chemotherapy, Received Radiation Date Acquired: 10/27/2021 N/A N/A Weeks of Treatment: 4 N/A N/A Wound Status: Open N/A N/A Wound Recurrence: No N/A N/A Measurements L x W x D (cm) 6x13.5x0.1 N/A N/A Area (cm) : 63.617 N/A N/A Volume (cm) : 6.362 N/A N/A % Reduction in Area: 31.90% N/A N/A % Reduction in Volume: 31.90% N/A N/A Classification: Partial Thickness N/A N/A Exudate Amount: Large N/A N/A Exudate Type: Serosanguineous N/A N/A Exudate Color: red, brown N/A N/A Wound Margin: Indistinct, nonvisible N/A N/A Granulation Amount: Medium (34-66%) N/A N/A Granulation Quality: Pink, Pale N/A N/A Necrotic Amount: Medium (34-66%) N/A N/A Exposed Structures: Fat Layer (Subcutaneous Tissue): N/A N/A Yes Fascia: No Tendon: No Muscle: No TYRIA, SPRINGER ([id number]) Joint: No Bone: No Epithelialization: None N/A N/A Treatment Notes Electronic Signature(s) Signed: 12/25/2021 4:31:00 PM By: Levora Dredge Entered By: Levora Dredge on 12/25/2021 14:41:54 Tomei, [name] ([id number]) -------------------------------------------------------------------------------- Multi-Disciplinary Care Plan Details Patient Name: MENTEL,  Maday J. Date of Service: 12/25/2021 2:15 PM Medical Record Number: [id number] Patient Account Number: [id number] Date of Birth/Sex: July 03, 1946 (76 y.o. F) Treating RN: Levora Dredge Primary Care Marris Frontera: [name] Other Clinician: Referring Brenlynn Fake: [name] Treating Bertin Inabinet/Extender: [name] in Treatment: 4 Active Inactive Necrotic Tissue Nursing Diagnoses: Impaired tissue integrity related to necrotic/devitalized tissue Knowledge deficit related to management of necrotic/devitalized tissue Goals: Necrotic/devitalized tissue will be minimized in the wound bed Date Initiated: 11/27/2021 Target Resolution Date: 12/25/2021 Goal Status: Active Patient/caregiver will verbalize  understanding of reason and process for debridement of necrotic tissue Date Initiated: 11/27/2021 Target Resolution Date: 12/25/2021 Goal Status: Active Interventions: Assess patient pain level pre-, during and post procedure and prior to discharge Provide education on necrotic tissue and debridement process Notes: Orientation to the Wound Care Program Nursing Diagnoses: Knowledge deficit related to the wound healing center program Goals: Patient/caregiver will verbalize understanding of the [place] Date Initiated: 11/27/2021 Target Resolution Date: 12/25/2021 Goal Status: Active Interventions: Provide education on orientation to the wound center Notes: Soft Tissue Infection Nursing Diagnoses: Impaired tissue integrity Knowledge deficit related to disease process and management Knowledge deficit related to home infection control: handwashing, handling of soiled dressings, supply storage Potential for infection: soft tissue Goals: Patient will remain free of wound infection Date Initiated: 11/27/2021 Target Resolution Date: 12/25/2021 Goal Status: Active Patient/caregiver will verbalize understanding of or measures to prevent infection and contamination in the home setting Date Initiated: 11/27/2021 Target Resolution Date: 12/25/2021 Goal Status: Active Patient's soft tissue infection will resolve Date Initiated: 11/27/2021 Target Resolution Date: 12/25/2021 Goal Status: Active BABARA, BUFFALO ([id number]) Signs and symptoms of infection will be recognized early to allow for prompt treatment Date Initiated: 11/27/2021 Target Resolution Date: 12/25/2021 Goal Status: Active Interventions: Assess signs and symptoms of infection every visit Provide education on infection Treatment Activities: Culture and sensitivity : 11/27/2021 Education provided on Infection : 12/05/2021 Notes: Venous Leg Ulcer Nursing Diagnoses: Actual venous Insuffiency (use after diagnosis  is confirmed) Knowledge deficit related to disease process and management Potential for venous Insuffiency (use before diagnosis confirmed) Goals: Patient will maintain optimal edema control Date Initiated: 11/27/2021 Target Resolution Date: 12/25/2021 Goal Status: Active Patient/caregiver will verbalize understanding of disease process and disease management Date Initiated: 11/27/2021 Target Resolution Date: 12/25/2021 Goal Status: Active Verify adequate tissue perfusion prior to therapeutic compression application Date Initiated: 11/27/2021 Target Resolution Date: 12/25/2021 Goal Status: Active Interventions: Provide education on venous insufficiency Treatment Activities: Therapeutic compression applied : 11/27/2021 Notes: Wound/Skin Impairment Nursing Diagnoses: Impaired tissue integrity Knowledge deficit related to smoking impact on wound healing Knowledge deficit related to ulceration/compromised skin integrity Goals: Patient/caregiver will verbalize understanding of skin care regimen Date Initiated: 11/27/2021 Target Resolution Date: 12/25/2021 Goal Status: Active Ulcer/skin breakdown will have a volume reduction of 30% by week 4 Date Initiated: 11/27/2021 Target Resolution Date: 12/25/2021 Goal Status: Active Ulcer/skin breakdown will have a volume reduction of 50% by week 8 Date Initiated: 11/27/2021 Target Resolution Date: 01/22/2022 Goal Status: Active Ulcer/skin breakdown will have a volume reduction of 80% by week 12 Date Initiated: 11/27/2021 Target Resolution Date: 02/22/2022 Goal Status: Active Ulcer/skin breakdown will heal within 14 weeks Date Initiated: 11/27/2021 Target Resolution Date: 03/08/2022 Goal Status: Active Interventions: Assess ulceration(s) every visit Provide education on ulcer and skin care SHEQUILLA, GOODGAME ([id number]) Treatment Activities: Skin care regimen initiated : 11/27/2021 Topical wound management initiated : 11/27/2021 Notes: Electronic  Signature(s) Signed: 12/25/2021 4:31:00 PM By: Levora Dredge Entered By:  Levora Dredge on 12/25/2021 14:41:42 SHAILYN, WEYANDT ([id number]) -------------------------------------------------------------------------------- Pain Assessment Details Patient Name: ZAHLI, VETSCH. Date of Service: 12/25/2021 2:15 PM Medical Record Number: [id number] Patient Account Number: [id number] Date of Birth/Sex: 03-06-1946 (76 y.o. F) Treating RN: Levora Dredge Primary Care Garrette Caine: [name] Other Clinician: Referring Salisa Broz: [name] Treating Renetta Suman/Extender: [name] in Treatment: 4 Active Problems Location of Pain Severity and Description of Pain Patient Has Paino No Site Locations Rate the pain. Current Pain Level: 0 Pain Management and Medication Current Pain Management: Electronic Signature(s) Signed: 12/25/2021 4:31:00 PM By: Levora Dredge Entered By: Levora Dredge on 12/25/2021 14:18:02 Mohammed, [name] ([id number]) -------------------------------------------------------------------------------- Patient/Caregiver Education Details Patient Name: HAELI, GERLICH. Date of Service: 12/25/2021 2:15 PM Medical Record Number: [id number] Patient Account Number: [id number] Date of Birth/Gender: 1946-10-27 (76 y.o. F) Treating RN: Levora Dredge Primary Care Physician: [name] Other Clinician: Referring Physician: [name] Treating Physician/Extender: [name] in Treatment: 4 Education Assessment Education Provided To: Patient Education Topics Provided Welcome [facility]: Handouts: Welcome To [facility] Methods: Explain/Verbal Responses: State content correctly Wound/Skin Impairment: Handouts: Caring for Your Ulcer Methods: Explain/Verbal Responses: State content correctly Electronic Signature(s) Signed: 12/25/2021 4:31:00 PM By: Levora Dredge Entered By: Levora Dredge on 12/25/2021  16:28:31 HAJER, DWYER ([id number]) -------------------------------------------------------------------------------- Wound Assessment Details Patient Name: HAPPY, BEGEMAN. Date of Service: 12/25/2021 2:15 PM Medical Record Number: [id number] Patient Account Number: [id number] Date of Birth/Sex: December 31, 1945 (76 y.o. F) Treating RN: Levora Dredge Primary Care Tyreck Bell: [name] Other Clinician: Referring Kathyleen Radice: [name] Treating Jared Cahn/Extender: Jeri Cos Weeks in Treatment: 4 Wound Status Wound Number: 15 Primary Lymphedema Etiology: Wound Location: Right, Circumferential Lower Leg Secondary Venous Leg Ulcer Wounding Event: Gradually Appeared Etiology: Date Acquired: 10/27/2021 Wound Open Weeks Of Treatment: 4 Status: Clustered Wound: No Comorbid Cataracts, Asthma, Sleep Apnea, Deep Vein Thrombosis, History: Hypertension, Peripheral Venous Disease, Osteoarthritis, Received Chemotherapy, Received Radiation Photos Wound Measurements Length: (cm) 6 Width: (cm) 13.5 Depth: (cm) 0.1 Area: (cm) 63.617 Volume: (cm) 6.362 % Reduction in Area: 31.9% % Reduction in Volume: 31.9% Epithelialization: None Tunneling: No Undermining: No Wound Description Classification: Partial Thickness Wound Margin: Indistinct, nonvisible Exudate Amount: Large Exudate Type: Serosanguineous Exudate Color: red, brown Foul Odor After Cleansing: No Slough/Fibrino Yes Wound Bed Granulation Amount: Medium (34-66%) Exposed Structure Granulation Quality: Pink, Pale Fascia Exposed: No Necrotic Amount: Medium (34-66%) Fat Layer (Subcutaneous Tissue) Exposed: Yes Necrotic Quality: Adherent Slough Tendon Exposed: No Muscle Exposed: No Joint Exposed: No Bone Exposed: No Electronic Signature(s) Signed: 12/25/2021 4:31:00 PM By: Levora Dredge Entered By: Levora Dredge on 12/25/2021 14:28:30 Larsh, [name]  ([id number]) -------------------------------------------------------------------------------- Wound Assessment Details Patient Name: FABIENNE, NOLASCO. Date of Service: 12/25/2021 2:15 PM Medical Record Number: [id number] Patient Account Number: [id number] Date of Birth/Sex: 03-Feb-1946 (76 y.o. F) Treating RN: Levora Dredge Primary Care Kierstan Auer: [name] Other Clinician: Referring Zariya Minner: [name] Treating Aiya Keach/Extender: Jeri Cos Weeks in Treatment: 4 Wound Status Wound Number: 16 Primary Lymphedema Etiology: Wound Location: Right, Medial Lower Leg Secondary Venous Leg Ulcer Wounding Event: Gradually Appeared Etiology: Date Acquired: 10/26/2021 Wound Open Weeks Of Treatment: 0 Status: Clustered Wound: No Comorbid Cataracts, Asthma, Sleep Apnea, Deep Vein Thrombosis, History: Hypertension, Peripheral Venous Disease, Osteoarthritis, Received Chemotherapy, Received Radiation Photos Wound Measurements Length: (cm) 1.5 Width: (cm) 1.4 Depth: (cm) 0.1 Area: (cm) 1.649 Volume: (cm) 0.165 % Reduction in Area: 0% % Reduction in Volume: 0% Epithelialization: None Tunneling: No Undermining: No Wound Description Classification:  Full Thickness Without Exposed Support Structures Exudate Amount: Medium Exudate Type: Serosanguineous Exudate Color: red, brown Foul Odor After Cleansing: No Slough/Fibrino Yes Wound Bed Granulation Amount: Medium (34-66%) Exposed Structure Granulation Quality: Red, Pink, Hyper-granulation Fat Layer (Subcutaneous Tissue) Exposed: Yes Necrotic Amount: Medium (34-66%) Necrotic Quality: Adherent Slough Treatment Notes Wound #16 (Lower Leg) Wound Laterality: Right, Medial Cleanser Soap and Water Discharge Instruction: Gently cleanse wound with antibacterial soap, rinse and pat dry prior to dressing wounds Peri-Wound Care Topical IDALY, VERRET. ([id number]) Triamcinolone Acetonide Cream, 0.1%, 15 (g) tube Discharge  Instruction: Apply as directed by Alyson Ki. apply to wound and leg Primary Dressing Xtrasorb Classic Super Absorbent Dressing, 6x9 (in/in) Discharge Instruction: over open areas of the leg that are weeping Secondary Dressing Secured With Compression Wrap Profore Lite LF 3 Multilayer Compression Bandaging System Discharge Instruction: Apply 3 multi-layer wrap as prescribed. Compression Stockings Add-Ons Electronic Signature(s) Signed: 12/25/2021 4:31:00 PM By: Levora Dredge Entered By: Levora Dredge on 12/25/2021 15:02:50 KAIJA, KOVACEVIC ([id number]) -------------------------------------------------------------------------------- Vitals Details Patient Name: MAURISA, TESMER. Date of Service: 12/25/2021 2:15 PM Medical Record Number: [id number] Patient Account Number: [id number] Date of Birth/Sex: Mar 11, 1946 (76 y.o. F) Treating RN: Levora Dredge Primary Care Yomayra Tate: [name] Other Clinician: Referring Nikolai Wilczak: [name] Treating Amiliah Campisi/Extender: [name] in Treatment: 4 Vital Signs Time Taken: 15:15 Temperature (F): 97.8 Height (in): 62 Pulse (bpm): 86 Weight (lbs): 199 Respiratory Rate (breaths/min): 18 Body Mass Index (BMI): 36.4 Blood Pressure (mmHg): 171/73 Reference Range: 80 - 120 mg / dl Electronic Signature(s) Signed: 12/25/2021 4:31:00 PM By: Levora Dredge Entered By: Levora Dredge on 12/25/2021 14:17:45

## 2021-12-26 ENCOUNTER — Other Ambulatory Visit: Payer: Self-pay | Admitting: Family Medicine

## 2022-01-01 ENCOUNTER — Encounter: Payer: Medicare Other | Admitting: Physician Assistant

## 2022-01-02 ENCOUNTER — Encounter: Payer: Medicare Other | Admitting: Physician Assistant

## 2022-01-02 ENCOUNTER — Other Ambulatory Visit: Payer: Self-pay

## 2022-01-02 DIAGNOSIS — L97212 Non-pressure chronic ulcer of right calf with fat layer exposed: Secondary | ICD-10-CM | POA: Diagnosis not present

## 2022-01-02 DIAGNOSIS — J45909 Unspecified asthma, uncomplicated: Secondary | ICD-10-CM | POA: Diagnosis not present

## 2022-01-02 DIAGNOSIS — I87331 Chronic venous hypertension (idiopathic) with ulcer and inflammation of right lower extremity: Secondary | ICD-10-CM | POA: Diagnosis not present

## 2022-01-02 DIAGNOSIS — I890 Lymphedema, not elsewhere classified: Secondary | ICD-10-CM | POA: Diagnosis not present

## 2022-01-02 DIAGNOSIS — I10 Essential (primary) hypertension: Secondary | ICD-10-CM | POA: Diagnosis not present

## 2022-01-02 DIAGNOSIS — I872 Venous insufficiency (chronic) (peripheral): Secondary | ICD-10-CM | POA: Diagnosis not present

## 2022-01-02 DIAGNOSIS — L97811 Non-pressure chronic ulcer of other part of right lower leg limited to breakdown of skin: Secondary | ICD-10-CM | POA: Diagnosis not present

## 2022-01-02 NOTE — Progress Notes (Addendum)
[name] ([id number]) Visit Report for 01/02/2022 Chief Complaint Document Details Patient Name: [name], [name]. Date of Service: 01/02/2022 3:45 PM Medical Record Number: [id number] Patient Account Number: [id number] Date of Birth/Sex: [birth year] (76 y.o. F) Treating RN: Levora Dredge Primary Care Provider: [name] Other Clinician: Referring Provider: [name] Treating Provider/Extender: [name] in Treatment: 5 Information Obtained from: Patient Chief Complaint Right LE Ulcer Electronic Signature(s) Signed: 01/02/2022 3:45:25 PM By: Worthy Keeler PA-C Entered By: Worthy Keeler on 01/02/2022 15:45:24 [name], [name] ([id number]) -------------------------------------------------------------------------------- HPI Details Patient Name: [name], [name]. Date of Service: 01/02/2022 3:45 PM Medical Record Number: [id number] Patient Account Number: [id number] Date of Birth/Sex: 02-06-1946 (76 y.o. F) Treating RN: Levora Dredge Primary Care Provider: [name] Other Clinician: Referring Provider: [name] Treating Provider/Extender: [name] in Treatment: 5 History of Present Illness HPI Description: Pleasant 76 year old with history of chronic venous insufficiency. No diabetes or peripheral vascular disease. Left ABI 1.29. Questionable history of left lower extremity DVT. She developed a recurrent ulceration on her left lateral calf in December 2015, which she attributes to poor diet and subsequent lower extremity edema. She underwent endovenous laser ablation of her left greater saphenous vein in 2010. She underwent laser ablation of accessory branch of left GSV in April 2016 by Dr. Kellie Simmering at [facility]. She was previously wearing Unna boots, which she tolerated well. Tolerating 2 layer compression and cadexomer iodine. She returns to clinic for follow-up and is without new complaints. She denies any significant pain at this  time. She reports persistent pain with pressure. No claudication or ischemic rest pain. No fever or chills. No drainage. READMISSION 11/13/16; this is a 76 year old woman who is not a diabetic. She is here for a review of a painful area on her left medial lower extremity. I note that she was seen here previously last year for wound I believe to be in the same area. At that time she had undergone previously a left greater saphenous vein ablation by Dr. Kellie Simmering and she had a ablation of the anterior accessory branch of the left greater saphenous vein in March 2016. Seeing that the wound actually closed over. In reviewing the history with her today the ulcer in this area has been recurrent. She describes a biopsy of this area in 2009 that only showed stasis physiology. She also has a history of today malignant melanoma in the right shoulder for which she follows with Dr. 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 Do not see that she has ever had arterial studies in the left leg. She tells me she has a follow-up with Dr. Kellie Simmering in roughly 10 days In any case she developed the reopening of this area roughly a month ago. On the background of this she describes rapidly increasing edema which has responded to Lasix 40 mg and metolazone 2.5 mg as well as the patient's lymph massage. She has been told she has both venous insufficiency and lymphedema but she cannot tolerate compression stockings 11/28/16; the patient saw Dr. Kellie Simmering recently. Per the patient he did arterial Dopplers in the office that did not show evidence of arterial insufficiency, per the patient he stated "treat this like an ordinary venous ulcer". She also saw her dermatologist Dr. Ronnald Ramp who felt that this was more of a vascular ulcer. In general things are improving although she  arrives today with increasing bilateral lower  extremity edema with weeping a deeper fluid through the wound on the left medial leg compatible with some degree of lymphedema 12/04/16; the patient's wound is fully epithelialized but I don't think fully healed. We will do another week of depression with Promogran and TCA however I suspect we'll be able to discharge her next week. This is a very unusual-looking wound which was initially a figure-of-eight type wound lying on its side surrounded by petechial like hemorrhage. She has had venous ablation on this side. She apparently does not have an arterial issue per Dr. Kellie Simmering. She saw her dermatologist thought it was "vascular". Patient is definitely going to need ongoing compression and I talked about this with her today she will go to elastic therapy after she leaves here next week 12/11/16; the patient's wound is not completely closed today. She has surrounding scar tissue and in further discussion with the patient it would appear that she had ulcers in this area in 2009 for a prolonged period of time ultimately requiring a punch biopsy of this area that only showed venous insufficiency. I did not previously pickup on this part of the history from the patient. 12/18/16; the patient's wound is completely epithelialized. There is no open area here. She has significant bilateral venous insufficiency with secondary lymphedema to a mild-to-moderate degree she does not have compression stockings.. She did not say anything to me when I was in the room, she told our intake nurse that she was still having pain in this area. This isn't unusual recurrent small open area. She is going to go to elastic therapy to obtain compression stockings. 12/25/16; the patient's wound is fully epithelialized. There is no open area here. The patient describes some continued episodic discomfort in this area medial left calf. However everything looks fine and healed here. She is been to elastic therapy and caught herself 15-20 mmHg  stockings, they apparently were having trouble getting 20-30 mm stockings in her size 01/22/17; this is a patient we discharged from the clinic a month ago. She has a recurrent open wound on her medial left calf. She had 15 mm support stockings. I told her I thought she needed 20-30 mm compression stockings. She tells me that she has been ill with hospitalization secondary to asthma and is been found to have severe hypokalemia likely secondary to a combination of Lasix and metolazone. This morning she noted blistering and leaking fluid on the posterior part of her left leg. She called our intake nurse urgently and we was saw her this afternoon. She has not had any real discomfort here. I don't know that she's been wearing any stockings on this leg for at least 2-3 days. ABIs in this clinic were 1.21 on the right and 1.3 on the left. She is previously seen vascular surgery who does not think that there is a peripheral arterial issue. 01/30/17; Patient arrives with no open wound on the left leg. She has been to elastic therapy and obtained 20-2mhg below knee stockings and she has one on the right leg today. READMISSION 02/19/18; this PMachois a now 76year old patient we've had in this clinic perhaps 3 times before. I had last looked at her from January 07 December 2016 with an area on the medial left leg. We discharged her on 12/25/16 however she had to be readmitted on 01/22/17 with a recurrence. I have in my notes that we discharged her on 20-30 mm stockings although she tells me  she was only wearing support hose because she cannot get stockings on predominantly related to her cervical spine surgery/issues. She has had previous ablations done by vein and vascular in [place] including a great saphenous vein ablation on the left with an anterior accessory branch ablation I think both of these were in 2016. On one of the previous visit she had a biopsy noted 2009 that was negative. She is not felt to  have an arterial issue. She is not a diabetic. She does have a history of obstructive sleep apnea hypertension asthma as well as chronic venous insufficiency and lymphedema. On this occasion she noted 2 dry scaly patch on her left leg. She tried to put lotion on this it didn't really help. There were 2 open areas.the patient has been seeing her primary physician from 02/05/18 through 02/14/18. She had Unna boots applied. The superior wound now on the lateral left leg has closed but she's had one wound that remains open on the lateral left leg. This is not the same spot as we dealt with in 2018. ABIs in this clinic were 1.3 bilaterally [name], [name] ([id number]) 02/26/18; patient has a small wound on the left lateral calf. Dimensions are down. She has chronic venous insufficiency and lymphedema. 03/05/18; small open area on the left lateral calf. Dimensions are down. Tightly adherent necrotic debris over the surface of the wound which was difficult to remove. Also the dressing [over collagen] stuck to the wound surface. This was removed with some difficulty as well. Change the primary dressing to Hydrofera Blue ready 03/12/18; small open area on the left lateral calf. Comes in with tightly adherent surface eschar as well as some adherent Hydrofera Blue. 03/19/18; open area on the left lateral calf. Again adherent surface eschar as well as some adherent Hydrofera Blue nonviable subcutaneous tissue. She complained of pain all week even with the reduction from 4-3 layer compression I put on last week. Also she had an increase in her ankle and calf measurements probably related to the same thing. 03/26/18; open area on the left lateral calf. A very small open area remains here. We used silver alginate starting last week as the Hydrofera Blue seem to stick to the wound bed. In using 4-layer compression 04/02/18; the open area in the left lateral calf at some adherent slough which I removed there is no open area  here. We are able to transition her into her own compression stocking. Truthfully I think this is probably his support hose. However this does not maintain skin integrity will be limited. She cannot put over the toe compression stockings on because of neck problems hand problems etc. She is allergic to the lining layer of juxta lites. We might be forced to use extremitease stocking should this fail READMIT 11/24/2018 Patient is now a 76 year old woman who is not a diabetic. She has been in this clinic on at least 3 previous occasions largely with recurrent wounds on her left leg secondary to chronic venous insufficiency with secondary lymphedema. Her situation is complicated by inability to get stockings on and an allergy to neoprene which is apparently a component and at least juxta lites and other stockings. As a result she really has not been wearing any stockings on her legs. She tells [place] that roughly 2 or 3 weeks ago she started noticing a stinging sensation just above her ankle on the left medial aspect. She has been diagnosed with pseudogout and she wondered whether this was what she was  experiencing. She tried to dress this with something she bought at the store however subsequently it pulled skin off and now she has an open wound that is not improving. She has been using Vaseline gauze with a cover bandage. She saw her primary doctor last week who put an Haematologist on her. ABIs in this clinic was 1.03 on the left 2/12; the area is on the left medial ankle. Odd-looking wound with what looks to be surface epithelialization but a multitude of small petechial openings. This clearly not closed yet. We have been using silver alginate under 3 layer compression with TCA 2/19; the wound area did not look quite as good this week. Necrotic debris over the majority of the wound surface which required debridement. She continues to have a multitude of what looked to be small petechial openings. She  reminds [place] that she had a biopsy on this initially during her first outbreak in 2015 in [place] dermatology. She expresses concern about this being a possible melanoma. She apparently had a nodular melanoma up on her shoulder that was treated with excision, lymph node removal and ultimately radiation. I assured her that this does not look anything like melanoma. Except for the petechial reaction it does look like a venous insufficiency area and she certainly has evidence of this on both sides 2/26; a difficult area on the left medial ankle. The patient clearly has chronic venous hypertension with some degree of lymphedema. The odd thing about the area is the small petechial hemorrhages. I am not really sure how to explain this. This was present last time and this is not a compression injury. We have been using Hydrofera Blue which I changed to last week 3/4; still using Hydrofera Blue. Aggressive debridement today. She does not have known arterial issues. She has seen Dr. Kellie Simmering at [facility] vein and vascular and and has an ablation on the left. [Anterior accessory branch of the greater saphenous]. From what I remember they did not feel she had an arterial issue. The patient has had this area biopsied in 2009 at [facility] dermat[facility] and by her recollection they said this was "stasis". She is also follow-up with dermatology locally who thought that this was more of a vascular issue 3/11; using Hydrofera Blue. Aggressive debridement today. She does not have an arterial issue. We are using 3 layer compression although we may need to go to 4. The patient has been in for multiple changes to her wrap since I last saw her a week ago. She says that the area was leaking. I do not have too much more information on what was found 01/19/19 on evaluation today patient was actually being seen for a nurse visit when unfortunately she had the area on her left lateral lower extremity as well as weeping from the  right lower extremity that became apparent. Therefore we did end up actually seeing her for a full visit with myself. She is having some pain at this site as well but fortunately nothing too significant at this point. No fevers, chills, nausea, or vomiting noted at this time. 3/18-Patient is back to the clinic with the left leg venous leg ulcer, the ulcer is larger in size, has a surface that is densely adherent with fibrinous tissue, the Hydrofera Blue was used but is densely adherent and there was difficulty in removing it. The right lower extremity was also wrapped for weeping edema. Patient has a new area over the left lateral foot above the malleolus that is  small and appears to have no debris with intact surrounding skin. Patient is on increased dose of Lasix also as a means to edema management 3/25; the patient has a nonhealing venous ulcer on the medial left leg and last week developed a smaller area on the lateral left calf. We have been using Hydrofera Blue with a contact layer. 4/1; no major change in these wounds areas. Left medial and more recently left lateral calf. I tried Iodoflex last week to aid in debridement she did not tolerate this. She stated her pain was terrible all week. She took the top layer of the 4 layer compression off. 4/8; the patient actually looks somewhat better in terms of her more prominent left lateral calf wound. There is some healthy looking tissue here. She is still complaining of a lot of discomfort. 4/15; patient in a lot of pain secondary to sciatica. She is on a prednisone taper prescribed by her primary physician. She has the 2 areas one on the left medial and more recently a smaller area on the left lateral calf. Both of these just above the malleoli 4/22; her back pain is better but she still states she is very uncomfortable and now feels she is intolerant to the [employer]. No real change in the wounds we have been using Sorbact. She has been  previously intolerant to Iodoflex. There is not a lot of option about what we can use to debride this wound under compression that she no doubt needs. sHe states Ultram no longer works for her pain 4/29; no major change in the wounds slightly increased depth. Surface on the original medial wound perhaps somewhat improved however the more recent area on the lateral left ankle is 100% covered in very adherent debris we have been using Sorbact. She tolerates 4 layer compression well and her edema control is a lot better. She has not had to come in for a nurse check 5/6; no major change in the condition of the wounds. She did consent to debridement today which was done with some difficulty. Continuing Sorbact. She did not tolerate Iodoflex. She was in for a check of her compression the day after we wrapped her last week this was adjusted but nothing much was found 5/13; no major change in the condition or area of the wounds. I was able to get a fairly aggressive debridement done on the lateral left leg wound. Even using Sorbact under compression. She came back in on Friday to have the wrap changed. She says she felt uncomfortable on the lateral aspect of her ankle. She has a long history of chronic venous insufficiency including previous ablation surgery on this side. 5/20-Patient returns for wounds on left leg with both wounds covered in slough, with the lateral leg wound larger in size, she has been in 3 layer compression and felt more comfortable, she describes pain in ankle, in leg and pins and needles in foot, and is about to try Pamelor for this 6/3; wounds on the left lateral and left medial leg. The area medially which is the most recent of the 2 seems to have had the largest increase in [place], [name]. ([id number]) dimensions. We have been using Sorbac to try and debride the surface. She has been to see orthopedics they apparently did a plain x-ray that was indeterminant. Diagnosed her with  neuropathy and they have ordered an MRI to determine if there is underlying osteomyelitis. This was not high on my thought list but I suppose  it is prudent. We have advised her to make an appointment with vein and vascular in [place]. She has a history of a left greater saphenous and accessory vein ablations I wonder if there is anything else that can be done from a surgical point of view to help in these difficult refractory wounds. We have previously healed this wound on one occasion but it keeps on reopening [medial side] 6/10; deep tissue culture I did last week I think on the left medial wound showed both moderate E. coli and moderate staph aureus [MSSA]. She is going to require antibiotics and I have chosen Augmentin. We have been using Sorbact and we have made better looking wound surface on both sides but certainly no improvement in wound area. She was back in last Friday apparently for a dressing changes the wrap was hurting her outer left ankle. She has not managed to get a hold of vein and vascular in [place]. We are going to have to make her that appointment 6/17; patient is tolerating the Augmentin. She had an MRI that I think was ordered by orthopedic surgeon this did not show osteomyelitis or an abscess did suggest cellulitis. We have been using Sorbact to the lateral and medial ankles. We have been trying to arrange a follow-up appointment with vein and vascular in [place] or did her original ablations. We apparently an area sent the request to vein and vascular in [facility] 6/24; patient has completed the Augmentin. We do not yet have a vein and vascular appointment in [place]. I am not sure what the issue is here we have asked her to call tomorrow. We are using Sorbact. Making some improvements and especially the medial wound. Both surfaces however look better medial and lateral. 7/1; the patient has been in contact with vein and vascular in [place] but has not yet  received an appointment. Using Sorbact we have gradually improve the wound surface with no improvement in surface area. She is approved for Apligraf but the wound surface still is not completely viable. She has not had to come in for a dressing change 7/8; the patient has an appointment with vein and vascular on 7/31 which is a Friday afternoon. She is concerned about getting back here for [place] to dress her wounds. I think it is important to have them goal for her venous reflux/history of ablations etc. to see if anything else can be done. She apparently tested positive for 1 of the blood tests with regards to lupus and saw a rheumatologist. He has raised the issue of vasculitis again. I have had this thought in the past however the evidence seems overwhelming that this is a venous reflux etiology. If the rheumatologist tells me there is clinical and laboratory investigation is positive for lupus I will rethink this. 7/15; the patient's wound surfaces are quite a bit better. The medial area which was her original wound now has no depth although the lateral wound which was the more recent area actually appears larger. Both with viable surfaces which is indeed better. Using Sorbact. I wanted to use Apligraf on her however there is the issue of the vein and vascular appointment on 7/31 at 2:00 in the afternoon which would not allow her to get back to be rewrapped and they would no doubt remove the graft 7/22; the patient's wound surfaces have moderate amount of debris although generally look better. The lateral one is larger with 2 small satellite areas superiorly. We are waiting for her vein and  vascular appointment on 7/31. She has been approved for Apligraf which I would like to use after th 7/29; wound surfaces have improved no debridement is required we have been using Sorbact. She sees vein and vascular on Friday with this so question of whether anything can be done to lessen the likelihood of  recurrence and/or speed the healing of these areas. She is already had previous ablations. She no doubt has severe venous hypertension 8/5-Patient returns at 1 week, she was in [place] for 3 days by her podiatrist, we have been using so backed to the wound, she has increased pain in both the wounds on the left lower leg especially the more distal one on the lateral aspect 8/12-Patient returns at 1 week and she is agreeable to having debridement in both wounds on her left leg today. We have been using Sorbact, and vascular studies were reviewed at last visit 8/19; the patient arrives with her wounds fairly clean and no debridement is required. We have used Sorbact which is really done a nice job in cleaning up these very difficult wound surfaces. The patient saw Dr. Donzetta Matters of vascular surgery on 7/31. He did not feel that there was an arterial component. He felt that her treated greater saphenous vein is adequately addressed and that the small saphenous vein did not appear to be involved significantly. She was also noted to have deep venous reflux which is not treatable. Dr. Donzetta Matters mentioned the possibility of a central obstructive component leading to reflux and he offered her central venography. She wanted to discuss this or think about it. I have urged her to go ahead with this. She has had recurrent difficult wounds in these areas which do heal but after months in the clinic. If there is anything that can be done to reduce the likelihood of this I think it is worth it. 9/2 she is still working towards getting follow-up with Dr. Donzetta Matters to schedule her CT. Things are quite a bit worse venography. I put Apligraf on 2 weeks ago on both wounds on the medial and lateral part of her left lower leg. She arrives in clinic today with 3 superficial additional wounds above the area laterally and one below the wound medially. She describes a lot of discomfort. I think these are probably wrapped injuries. Does not  look like she has cellulitis. 07/20/2019 on evaluation today patient appears to be doing somewhat poorly in regard to her lower extremity ulcers. She in fact showed signs of erythema in fact we may even be dealing with an infection at this time. Unfortunately I am unsure if this is just infection or if indeed there may be some allergic reaction that occurred as a result of the Apligraf application. With that being said that would be unusual but nonetheless not impossible in this patient is one who is unfortunately allergic to quite a bit. Currently we have been using the Sorbact which seems to do as well as anything for her. I do think we may want to obtain a culture today to see if there is anything showing up there that may need to be addressed. 9/16; noted that last week the wounds look worse in 1 week follow-up of the Apligraf. Using Sorbact as of 2 days ago. She arrives with copious amounts of drainage and new skin breakdown on the back of the left calf. The wounds arm more substantial bilaterally. There is a fair amount of swelling in the left calf no overt DVT there  is edema present I think in the left greater than right thigh. She is supposed to go on 9/28 for CT venography. The wounds on the medial and lateral calf are worse and she has new skin breakdown posteriorly at least new for me. This is almost developing into a circumferential wound area The Apligraf was taken off last week which I agree with things are not going in the right direction a culture was done we do not have that back yet. She is on Augmentin that she started 2 days ago 9/23; dressing was changed by her nurses on Monday. In general there is no improvement in the wound areas although the area looks less angry than last week. She did get Augmentin for MSSA cultured on the 14th. She still appears to have too much swelling in the left leg even with 3 layer compression 9/30; the patient underwent her procedure on 9/28 by Dr.  Donzetta Matters at [facility]. She was discovered to have the common iliac vein measuring 12.2 mm but at the level of L4-L5 measured 3 mm. After stenting it measured 10 mm. 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 Rouleaux flow in the common femoral and femoral vein was observed much improved after stenting. We are using silver alginate to the wounds on the medial and lateral ankle on the left. 4 layer compression 10/7; the patient had fluid swelling around her knee and 4 layer compression. At the advice of vein and vascular this was reduced to 3 layer which she is tolerating better. We have been using silver alginate under 3 layer compression since last Friday 10/14; arrives with the areas on the left ankle looking a lot better. Inflammation in the area also a lot better. She came in for a nurse check on 10/9 10/21; continued nice improvement. Slight improvements in surface area of both the medial and lateral wounds on the left. A lot of the satellite lesions in the weeping erythema around these from stasis dermatitis is resolved. We have been using silver alginate [name], [name] ([id number]) 10/28; general improvement in the entire wound areas although not a lot of change in dimensions the wound certainly looks better. There is a lot less in terms of venous inflammation. Continue silver alginate this week however look towards Hydrofera Blue next week 11/4; very adherent debris on the medial wound left wound is not as bad. We have been using silver alginate. Change to [facility] today 11/11; very adherent debris on both wound areas. She went to vein and vascular last week and follow-up they put in [place] boot on this today. He says the [facility] was adherent. Wound is definitely not as good as last week. Especially on the left there the satellite lesions look more prominent 11/18; absolutely no better. erythema on lateral aspect with tenderness. 09/30/2019 on  evaluation today patient appears to actually be doing better. Dr. Dellia Nims did put her on doxycycline last week which I do believe has helped her at this point. Fortunately there is no signs of active infection at this time. No fevers, chills, nausea, vomiting, or diarrhea. I do believe he may want extend the doxycycline for 7 additional days just to ensure everything does completely cleared up the patient is in agreement with that plan. Otherwise she is going require some sharp debridement today 12/2; patient is completing a 2-week course of doxycycline. I gave her this empirically for inflammation as well as infection when I last saw her  2 weeks ago. All of this seems to be better. She is using silver alginate she has the area on the medial aspect of the larger area laterally and the 2 small satellite regions laterally above the major wound. 12/9; the patient's wound on the left medial and left lateral calf look really quite good. We have been using silver alginate. She saw vein and vascular in follow-up on 10/09/2019. She has had a previous left greater saphenous vein ablation by Dr. Oscar La in 2016. More recently she underwent a left common iliac vein stent by Dr. 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 The swelling is improved and certainly the wounds have improved. The patient shows [place] today area on the right medial calf there is almost no wound but leaking lymphedema. She says she start this started 3 or 4 days ago. She did not traumatize it. It is not painful. She does not wear compression on that side 12/16; the patient continues to do well laterally. Medially still requiring debridement. The area on the right calf did not materialize to anything and is not currently open. We wrapped this last time. She has support stockings for that leg although I am not sure they are going to provide adequate compression 12/23; the lateral wound looks stable. Medially still requiring debridement for  tightly adherent fibrinous debris. We've been using silver alginate. Surface area not any different 12/30; neither wound is any better with regards to surface and the area on the left lateral is larger. I been using silver alginate to the left lateral which look quite good last week and Sorbact to the left medial 11/11/2019. Lateral wound area actually looks better and somewhat smaller. Medial still requires a very aggressive debridement today. We have been using Sorbact on both wound areas 1/13; not much better still adherent debris bilaterally. I been using Sorbact. She has severe venous hypertension. Probably some degree of dermal fibrosis distally. I wonder whether tighter compression might help and I am going to try that today. We also need to work on the bioburden 1/20; using Sorbact. She has severe venous hypertension status post stent placement for pelvic vein compression. We applied gentamicin last time to see if we could reduce bioburden I had some discussion with her today about the use of pentoxifylline. This is occasionally used in this setting for wounds with refractory venous insufficiency. However this interacts with Plavix. She tells me that she was put on this after stent placement for 3 months. She will call Dr. Claretha Cooper office to discuss 1/27; we are using gentamicin under Sorbact. 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 She has a stent. Wound medially is measuring smaller this week. Laterally measuring slightly larger although she has some satellite lesions superiorly 2/3; gentamicin under Sorbact under 4-layer compression. 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 She has a stent on Plavix. Her wounds are measuring smaller this week. More substantially laterally where there is a satellite lesion superiorly. 2/10; gentamicin under Sorbac. 4-layer compression. Patient communicated with Dr. Donzetta Matters at vein and vascular in  [place]. He is okay with the patient coming off Plavix I will therefore start her on pentoxifylline for a 1 month trial. In general her wounds look better today. I had some concerns about swelling in the left thigh however she measures 61.5 on the right and 63 on the mid thigh which does not suggest there is any difficulty. The patient is not describing any pain. 2/17; gentamicin under  Sorbac 4-layer compression. She has been on pentoxifylline for 1 week and complains of loose stool. No nausea she is eating and drinking well 2/24; the patient apparently came in 2 days ago for a nurse visit when her wrap fell down. Both areas look a little worse this week macerated medially and satellite lesions laterally. Change to silver alginate today 3/3; wounds are larger today especially medially. She also has more swelling in her foot lower leg and I even noted some swelling in her posterior thigh which is tender. I wonder about the patency of her stent. Fortuitously she sees Dr. Claretha Cooper group on Friday 3/10; Mrs. Guerrier was seen by vein and vascular on 3/5. The patient underwent ultrasound. There was no evidence of thrombosis involving the IVC no evidence of thrombosis involving the right common iliac vein there is no evidence of thrombosis involving the right external iliac vein the left external vein is also patent. The right common iliac vein stent appears patent bilateral common femoral veins are compressible and appear patent. I was concerned about the left common iliac stent however it looks like this is functional. She has some edema in the posterior thigh that was tender she still has that this week. I also note they had trouble finding the pulses in her left foot and booked her for an ABI baseline in 4 weeks. She will follow up in 6 months for repeat IVC duplex. The patient stopped the pentoxifylline because of diarrhea. It does not look like that was being effective in any case. I have advised her  to go back on her aspirin 81 mg tablet, vascular it also suggested this 3/17; comes in today with her wound surfaces a lot better. The excoriations from last week considerably better probably secondary to the TCA. We have been using silver alginate 3/24; comes in today with smaller wounds both medially and laterally. Both required debridement. There are 2 small satellite areas superiorly laterally. She also has a very odd bandlike area in the mid calf almost looking like there was a weakness in the wrap in a localized area. I would write this off as being this however anteriorly she has a small raised ballotable area that is very tender almost reminiscent of an abscess but there was no obvious purulent surface to it. 02/04/20 upon evaluation today patient appears to be doing fairly well in regard to her wounds today. Fortunately there is no signs of active infection at this time. No fevers, chills, nausea, vomiting, or diarrhea. She has been tolerating the dressing changes without complication. Fortunately I feel like she is showing signs of improvement although has been sometime since have seen her. Nonetheless the area of concern that Dr. Dellia Nims had last week where she had possibly an area of the wrap that was we can allow the leg to bulge appears to be doing significantly better today there is no signs of anything worsening. [name], [name] ([id number]) 4/7; the patient's wounds on her medial and lateral left leg continue to contract. We have been using a regular alginate. Last week she developed an area on the right medial lower leg which is probably a venous ulcer as well. 4/14; the wounds on her left medial and lateral lower leg continue to contract. Surface eschar. We have been using regular alginate. The area on the right medial lower leg is closed. We have been putting both legs under 4-layer contraction. The patient went back to see vein and vascular she had arterial studies done  which  were apparently "quite good" per the patient although I have not read their notes I have never felt she had an arterial issue. 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 This is been longstanding and refractory to exercise, leg elevation and longstanding use of compression wraps in our clinic as well as compression stockings on the times we have been able to get these to heal 4/21; we thought she actually might be close this week however she arrives in clinic with a lot of edema in her upper left calf and into her posterior thigh. This is been an intermittent problem here. She says the wrap fell down but it was replaced with a nurse visit on Monday. We are using calcium alginate to the wounds and the wound sizes there not terribly larger than last week but there is a lot more edema 4/28; again wound edges are smaller on both sides. Her edema is better controlled than last time. She is obtained her compression pumps from [employer] although they have not been to her home to set these up. 5/5; left medial and left lateral both look stable. I am not sure the medial is any smaller. We have been using calcium alginate under 4-layer compression. oShe had an area on the right medial. This was eschared today. We have been wrapping this as well. She does not tolerate external compression stockings due to a history of various contact allergies. She has her compression pumps however the representative from the company is coming on her to show her how to use these tomorrow 5/19; patient with severe chronic venous insufficiency secondary to central venous disease. She had a stent placed in her left common iliac vein. She has done better since but still difficult to control wounds. She comes in today with nothing open on the right leg. Her areas on the left medial and left lateral are just about closed. We are using calcium alginate under 4-layer compression.  She is using her external compression pumps at home She only has 15-20 support stockings. States she cannot get anything tighter than that on. 03/30/20-Patient returns at 1 week, the wounds on the left leg are both slightly bigger, the last week she was on 3 layer compression which started to slide down. She is starting to use her lymphedema pumps although she stated on 1 day her right ankle started to swell up and she have to stop that day. Unfortunately the open area seem to oscillate between improving to the point of healing and then flaring up all to do with effectiveness of compression or lack of due to the left leg topography not keeping the compression wraps from rolling down 6/2 patient comes in with a 15/20 mmHg stocking on the right leg. She tells me that she developed a lot of swelling in her ankles she saw orthopedics she was felt to possibly be having a flare of pseudogout versus some other type of arthritis. She was put on steroids for a respiratory issue so that helps with the inflammation. She has not been using the pumps all week. She thinks the left thigh is more swollen than usual and I would agree with that. She has an appointment with Dr. Donzetta Matters 9 days or so from now 6/9; both wounds on the left medial and left lateral are smaller. We have been using calcium alginate under compression. She does not have an open wound on the right leg she is using a stocking and her  compression pumps things are going well. She has an appointment with Dr. Donzetta Matters with regards to her stent in the left common iliac vein 6/16; the wounds on the left medial and left lateral ankle continues to contract. The patient saw Dr. Donzetta Matters and I think he seems satisfied. Ordered follow-up venous reflux studies on both sides in September. Cautioned that she may need thigh-high stockings. She has been using calcium alginate under compression on the left and her own stocking on the right leg. She tells [place] there are no open  wounds on the right 6/23; left lateral is just about closed. Medial required debridement today. We have been using calcium alginate. Extensive discussion about the compression pumps she is only using these on 25 mmHg states she could not take 40 or 30 when the wrap came out to her home to demonstrate these. He said they should not feel tight 6/30; the left lateral wound has a slight amount of eschar. . The area medially is about the same using Hydrofera Blue. 7/7; left lateral wound still has some eschar. I will remove this next week may be closed. The area medially is very small using Hydrofera Blue with improvement. Unfortunately the stockings fell down. Unfortunately the blisters have developed at the edge of where the wrap fell. When this happened she says her legs hurt she did not use her pumps. We are not open Monday for her to come in and change the wraps and she had an appointment yesterday. She also tells me that she is going to have an MRI of her back. She is having pain radiating into her left anterior leg she thinks her from an L5 disc. She saw Dr. Ellene Route of neurosurgery 7/14; the area on the left lateral ankle area is closed. Still a small area medially however it looks better as well. We have been using Hydrofera Blue under 4-layer compression 7/21; left lateral ankle is still closed however her wound on the medial left calf is actually larger. This is probably because Hydrofera Blue got stuck to the wound. She came in for a nurse change on Friday and will do that again this week I was concerned about the amount of swelling that she had last week however she is using her compression pumps twice a day and the swelling seems well controlled 7/28; remaining wound on the left medial lower leg is smaller. We have been using moistened silver collagen under compression she is coming back for a nurse visit. For reasons that were not really clear she was just keeping her legs elevated and not  using her compression pumps. I have asked her to use the compression pumps. She does not have any wounds on the right leg 06/15/20-Patient returns at 2 weeks, her LLE edema is worse and she developed a blister wound that is new and has bigger posterior calf wound on right, we are using Prisma with pad, 4 layer compression. she has been on lasix 40 mg daily 8/18; patient arrives today with things a lot worse than I remember from a few weeks ago. She was seen last week. Noted that her edema was worse and that she now had a left lateral wound as well as deteriorating edema in the medial and posterior part of the lower leg. She says she is using his or her external compression pumps once a day although I wonder about the compliance. 8/25; weeping area on the right medial lower leg. This had actually gotten a small localized area  of her compression stocking wet. oOn the left side there is a large denuded area on the posterior medial lower leg and smaller area on the lateral. This was not the original areas that we dealt with. 9/1 the patient's wound on the left leg include the left lateral and left posterior. Larger superficial wounds weeping. She has very poor edema control. Tender localized edema in the left lower medial ankle/heel probably because of localized wrap issues. She freely admits she is not using the compression pumps. She has been up on her feet a lot. She thinks the hydrofera blue is contributing to the pain she is experiencing.. This is a complaint that I have occasionally heard 9/8; really not much improvement. The patient is still complaining of a lot of pain particularly when she uses compression pumps. I switched her to silver alginate last time because she found the Hydrofera Blue to be irritating. I don't hear much difference in her description with the silver alginate. 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 She has follow-up with Dr. Donzetta Matters I think for ultrasound next month YU, CRAGUN ([id number]) 9/15; quite a bit of improvement today. We have less edema and more epithelialization in both of her wound areas on the left medial and left lateral calf. These are not the site of her original wounds in this area. She says she has been using her compression pumps for 30 minutes twice a day, there pain issues that never quite understood. Silver alginate as the primary dressing 9/22; continued improvement. Both areas medially and laterally still have a small open area there is some eschar. She continues to complain of left medial ankle pain. Swelling in the leg is in much better condition. We have been using silver alginate 9/29; continued improvement. Both areas medially and laterally in the left calf look as though they are close some minor surface eschar but I think this is epithelialized. She comes in today saying she has a ruptured disc at L4-L5 cannot bend over to put on her stockings. 10/6; patient comes in today with no open wounds on either leg. However her edema on the left leg in the upper one third of the lower leg is poorly controlled nonpitting. She says that she could not use the pumps for 2 days and then she has been using the last couple of days. It is not clear to me she has been able to get her stocking on. She has back problems. Mrs. Lukes has severe chronic venous insufficiency with secondary lymphedema. Her venous insufficiency is partially centrally mediated and that she is now post stent in the left common iliac vein. [facility] Thurner's syndrome/physiology]. She follows up with them on 10/15. She wears 20/30 below-knee stockings. She is supposed to use compression pumps at home although I think her compliance about with this is been less than 100%. I have asked her to use these 3 times a day. Finally I think she has lipodermatosclerosis in the left lower leg with an inverted  bottle sign. It is been a major problem controlling the edema in the left leg. The right leg we have had wounds on but not as significant a problem is on the left READMISSION 04/12/2021 Mrs. Wasser is a 76 year old woman we know well in this clinic. She has severe chronic venous insufficiency. 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 I believe she has had bilateral greater saphenous vein ablation in the past as well. She tells me that this wound opened sometime in March. She had a fall and thinks it was initially abrasion. She developed areas she describes as little blisters on the anterior part of her leg and she saw dermatology and was treated for methicillin staph aureus with several rounds of antibiotics. She has been using support stockings on the left leg and says this is the only thing she can get on. Her compression pump use maybe once a day she says if she did not use one she use the other. She comes in today with incredible swelling in the left leg with a wound on the left posterior calf. She has been using Neosporin to this previously a hydrocolloid. 6/15; patient arrives back for 1 week follow-up.Marland Kitchen Apparently her wrap fell down she did not call us to replace this. He has poor edema control. She only uses her compression pumps once a day 6/29; patient presents for 1 week follow-up. She has tolerated the compression wrap well. We have been using Iodoflex under the wrap. She has no issues or complaints today. 7/6; patient presents for 1 week follow-up. She states that the compression wrap rolled down her leg 4 days ago. She has been trying to keep the area covered but has no dressings at home to use. She denies signs of infection. 7/13; patient presents for 1 week follow-up. She states that her compression wrap rolled down her right leg and she called our office and had it placed a few days ago. She has been tolerating the current wrap well. She  states that the Iodoflex is causing a burning sensation. She denies signs of infection. 7/27; patient presents for 1 week follow-up She has tolerated the compression wrap well with Hydrofera Blue underneath. She has now developed a wound to her right lower extremity. She reports having a culture done To this area by her dermatologist that she reports is negative. She currently denies signs of infection. 8/30; patient presents for 1 week follow-up. On the left side she has tolerated the compression wrap well with Hydrofera Blue underneath. She reports some discomfort to her right lower extremity with the 3 layer compression. She currently denies signs of infection. 06/14/2021 upon evaluation today patient's wound actually appears to be doing decently well based on what I am seeing currently. She actually has 2 areas 1 on the left distal/posterior lower leg and the other on the right lower leg. Subsequently the measurements are roughly the same may be slightly smaller but in general have not made any trend towards getting worse which is great news. 06/23/2021 upon evaluation today patient appears to be doing pretty well in regard to her wounds. On the right she is having difficulty with sciatic pain and this subsequently has led to her taking the wrap off over the last week her leg is more swollen she is also been on prednisone for 10 days. With that being said I think we may want to just use a compression sock on the left that way she will not have to worry about the compression wrap being in place that she cannot get off. With that being said I do believe as well that the patient is going to need to continue with the wrapping on the left we will also need to do a little bit of sharp debridement today. 8/24; patient presents for 1 week follow-up. She has no issues  or complaints today. She denies signs of infection. 8/31; patient presents for 1 week follow-up. She has no issues or complaints today. She  has tolerated the compression wrap well on the left lower extremity. She denies signs of infection. 9/7; patient presents for follow-up. She reports that the compression wrap rolled down slightly on her right lower extremity. She reports tenderness to the wound bed. She denies increased warmth or erythema to the surrounding skin. She overall feels well. 9/14; patient presents for follow-up. She has no issues or complaints today. She denies signs of infection. PuraPly is available and patient would like to start this today. 9/21; patient presents for follow-up. She has no issues or complaints today. She tolerated the first skin substitute placement last week under compression. 9/28; patient presents for 1 week follow-up. She has no issues or complaints today. 10/5; patient presents for 1 week follow-up. She reports taking the wrap off yesterday. She was on her feet more yesterday and developed more swelling to her left lower extremity. She denies pain. Overall she is doing well. 10/12; patient presents for 1 week follow-up. She has no issues or complaints today. 10/19; patient presents for follow-up. She has no issues or complaints today. She tolerated the compression wrap well. She states she has compression stockings at home. [name], [name] ([id number]) Readmission: 11/27/2021 upon evaluation today patient appears to be doing poorly in regard to the wounds on her right leg. She also has an area of erythema in the proximal calf area of the right leg which again I do believe is a issue from the standpoint of it being warm and erythematous to touch and also seems to be somewhat localized I really think this may be more of a cellulitis issue than anything else. Fortunately I do not see any signs of active infection Systemically at this time. 12/05/2021 upon evaluation patient's wounds actually appear to be showing some signs of improvement which is not too bad and just 1 week's time. Especially  since we had to get on the right antibiotic which is only been for the past 4 days. Fortunately I do not see any signs of active infection locally nor systemically at this time that has gotten any worse. Overall I think that again with the antibiotic she is significantly improved and currently she is taking Levaquin. 12/11/2021 upon evaluation today patient appears to be doing well with regard to her leg. Between the antibiotics and the triamcinolone I feel like she is doing significantly better which is great news. Overall I am extremely pleased with where we stand. There does not appear to be any signs of active infection locally or systemically at this time. 12/18/2021 upon evaluation today patient appears to be doing well with regard to her wound. I do feel like this is showing signs of some improvement here. Little by little this is clearing up quite nicely. I think it is just a slow process but nonetheless 1 that we are seeing evidence of improvement with regard to. 12/25/2021 upon evaluation today patient appears to be doing well with regard to her wounds she is definitely making some good progress here. Fortunately I do not see any signs of active infection locally or systemically at this time which is great news. No fevers, chills, nausea, vomiting, or diarrhea. 01/02/2022 upon evaluation today patient appears to be doing well with regard to her wound. She has been tolerating the dressing changes without complication. Fortunately I do not see any evidence of active infection  locally or systemically which is great news and overall I am extremely pleased with where we stand today. Electronic Signature(s) Signed: 01/02/2022 4:17:45 PM By: Worthy Keeler PA-C Entered By: Worthy Keeler on 01/02/2022 16:17:44 Pettinato, [name] ([id number]) -------------------------------------------------------------------------------- Physical Exam Details Patient Name: MIHIKA, SURRETTE. Date of Service:  01/02/2022 3:45 PM Medical Record Number: [id number] Patient Account Number: [id number] Date of Birth/Sex: [birth year] (76 y.o. F) Treating RN: Levora Dredge Primary Care Provider: [name] Other Clinician: Referring Provider: [name] Treating Provider/Extender: [name] in Treatment: 5 Constitutional [name] and well-hydrated in no acute distress. Respiratory normal breathing without difficulty. Psychiatric this patient is able to make decisions and demonstrates good insight into disease process. Alert and Oriented x 3. pleasant and cooperative. Notes Patient's wound bed showed evidence of good granulation and epithelization at this point. Fortunately I think that we are headed in the right direction and overall I think that the patient is showing signs of improvement week by week even though it may be slow. Electronic Signature(s) Signed: 01/02/2022 4:18:05 PM By: Worthy Keeler PA-C Entered By: Worthy Keeler on 01/02/2022 16:18:05 [name], [name] ([id number]) -------------------------------------------------------------------------------- Physician Orders Details Patient Name: [name], [name]. Date of Service: 01/02/2022 3:45 PM Medical Record Number: [id number] Patient Account Number: [id number] Date of Birth/Sex: 1946-05-28 (76 y.o. F) Treating RN: Cornell Barman Primary Care Provider: [name] Other Clinician: Referring Provider: [name] Treating Provider/Extender: [name] in Treatment: 5 Verbal / Phone Orders: No Diagnosis Coding ICD-10 Coding Code Description I89.0 Lymphedema, not elsewhere classified I87.331 Chronic venous hypertension (idiopathic) with ulcer and inflammation of right lower extremity L97.811 Non-pressure chronic ulcer of other part of right lower leg limited to breakdown of skin Follow-up Appointments o Return Appointment in 1 week. o Nurse Visit as needed Bathing/ Shower/ Hygiene o May  shower with wound dressing protected with water repellent cover or cast protector. Edema Control - Lymphedema / Segmental Compressive Device / Other o Optional: One layer of unna paste to top of compression wrap (to act as an anchor). o Elevate, Exercise Daily and Avoid Standing for Long Periods of Time. o Elevate leg(s) parallel to the floor when sitting. o DO YOUR BEST to sleep in the bed at night. DO NOT sleep in your recliner. Long hours of sitting in a recliner leads to swelling of the legs and/or potential wounds on your backside. Wound Treatment Wound #15 - Lower Leg Wound Laterality: Right, Posterior Cleanser: Soap and Water Discharge Instructions: Gently cleanse wound with antibacterial soap, rinse and pat dry prior to dressing wounds Topical: Triamcinolone Acetonide Cream, 0.1%, 15 (g) tube Discharge Instructions: Apply as directed by provider. apply to wound and leg Primary Dressing: Xtrasorb Classic Super Absorbent Dressing, 6x9 (in/in) Discharge Instructions: over open areas of the leg that are weeping Compression Wrap: Profore Lite LF 3 Multilayer Compression Bandaging System Discharge Instructions: Apply 3 multi-layer wrap as prescribed. Wound #16 - Lower Leg Wound Laterality: Right, Medial Cleanser: Soap and Water Discharge Instructions: Gently cleanse wound with antibacterial soap, rinse and pat dry prior to dressing wounds Topical: Triamcinolone Acetonide Cream, 0.1%, 15 (g) tube Discharge Instructions: Apply as directed by provider. apply to wound and leg Primary Dressing: Xtrasorb Classic Super Absorbent Dressing, 6x9 (in/in) Discharge Instructions: over open areas of the leg that are weeping Compression Wrap: Profore Lite LF 3 Multilayer Compression Bandaging System Discharge Instructions: Apply 3 multi-layer wrap as prescribed. Electronic Signature(s) Signed: 01/02/2022 4:35:54 PM By: [name], [name], RN, CWS,  Maudie Mercury RN, [name] Signed: 01/02/2022 4:37:47 PM By: Worthy Keeler PA-C Entered By: [name], [name], RN, CWS, Kim on 01/02/2022 16:02:47 EMILEIGH, KELLETT ([id number], RAYSOR ([id number]) -------------------------------------------------------------------------------- Problem List Details Patient Name: [name], [name]. Date of Service: 01/02/2022 3:45 PM Medical Record Number: [id number] Patient Account Number: [id number] Date of Birth/Sex: [birth year] (76 y.o. F) Treating RN: Levora Dredge Primary Care Provider: [name] Other Clinician: Referring Provider: [name] Treating Provider/Extender: [name] in Treatment: 5 Active Problems ICD-10 Encounter Code Description Active Date MDM Diagnosis I89.0 Lymphedema, not elsewhere classified 11/27/2021 No Yes I87.331 Chronic venous hypertension (idiopathic) with ulcer and inflammation of 11/27/2021 No Yes right lower extremity L97.811 Non-pressure chronic ulcer of other part of right lower leg limited to 11/27/2021 No Yes breakdown of skin Inactive Problems Resolved Problems Electronic Signature(s) Signed: 01/02/2022 3:45:18 PM By: Worthy Keeler PA-C Entered By: Worthy Keeler on 01/02/2022 15:45:18 [name], [name] ([id number]) -------------------------------------------------------------------------------- Progress Note Details Patient Name: [name], [name]. Date of Service: 01/02/2022 3:45 PM Medical Record Number: [id number] Patient Account Number: [id number] Date of Birth/Sex: 02/28/1946 (76 y.o. F) Treating RN: Levora Dredge Primary Care Provider: [name] Other Clinician: Referring Provider: [name] Treating Provider/Extender: [name] in Treatment: 5 Subjective Chief Complaint Information obtained from Patient Right LE Ulcer History of Present Illness (HPI) Pleasant 76 year old with history of chronic venous insufficiency. No diabetes or peripheral vascular disease. Left ABI 1.29. Questionable history of left lower extremity  DVT. She developed a recurrent ulceration on her left lateral calf in December 2015, which she attributes to poor diet and subsequent lower extremity edema. She underwent endovenous laser ablation of her left greater saphenous vein in 2010. She underwent laser ablation of accessory branch of left GSV in April 2016 by Dr. Kellie Simmering at [facility]. She was previously wearing Unna boots, which she tolerated well. Tolerating 2 layer compression and cadexomer iodine. She returns to clinic for follow-up and is without new complaints. She denies any significant pain at this time. She reports persistent pain with pressure. No claudication or ischemic rest pain. No fever or chills. No drainage. READMISSION 11/13/16; this is a 76 year old woman who is not a diabetic. She is here for a review of a painful area on her left medial lower extremity. I note that she was seen here previously last year for wound I believe to be in the same area. At that time she had undergone previously a left greater saphenous vein ablation by Dr. Kellie Simmering and she had a ablation of the anterior accessory branch of the left greater saphenous vein in March 2016. Seeing that the wound actually closed over. In reviewing the history with her today the ulcer in this area has been recurrent. She describes a biopsy of this area in 2009 that only showed stasis physiology. 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 Do not see that she has ever had arterial studies in the left leg. She tells me she has a follow-up with Dr. Kellie Simmering in roughly 10 days In any case she developed the reopening of this area roughly a month ago. On the background of this she describes rapidly increasing edema which has responded to Lasix 40 mg and metolazone  2.5 mg as well as  the patient's lymph massage. She has been told she has both venous insufficiency and lymphedema but she cannot tolerate compression stockings 11/28/16; the patient saw Dr. Kellie Simmering recently. Per the patient he did arterial Dopplers in the office that did not show evidence of arterial insufficiency, per the patient he stated "treat this like an ordinary venous ulcer". She also saw her dermatologist Dr. Ronnald Ramp who felt that this was more of a vascular ulcer. In general things are improving although she arrives today with increasing bilateral lower extremity edema with weeping a deeper fluid through the wound on the left medial leg compatible with some degree of lymphedema 12/04/16; the patient's wound is fully epithelialized but I don't think fully healed. We will do another week of depression with Promogran and TCA however I suspect we'll be able to discharge her next week. This is a very unusual-looking wound which was initially a figure-of-eight type wound lying on its side surrounded by petechial like hemorrhage. She has had venous ablation on this side. She apparently does not have an arterial issue per Dr. Kellie Simmering. She saw her dermatologist thought it was "vascular". Patient is definitely going to need ongoing compression and I talked about this with her today she will go to elastic therapy after she leaves here next week 12/11/16; the patient's wound is not completely closed today. She has surrounding scar tissue and in further discussion with the patient it would appear that she had ulcers in this area in 2009 for a prolonged period of time ultimately requiring a punch biopsy of this area that only showed venous insufficiency. I did not previously pickup on this part of the history from the patient. 12/18/16; the patient's wound is completely epithelialized. There is no open area here. She has significant bilateral venous insufficiency with secondary lymphedema to a mild-to-moderate  degree she does not have compression stockings.. She did not say anything to me when I was in the room, she told our intake nurse that she was still having pain in this area. This isn't unusual recurrent small open area. She is going to go to elastic therapy to obtain compression stockings. 12/25/16; the patient's wound is fully epithelialized. There is no open area here. The patient describes some continued episodic discomfort in this area medial left calf. However everything looks fine and healed here. She is been to elastic therapy and caught herself 15-20 mmHg stockings, they apparently were having trouble getting 20-30 mm stockings in her size 01/22/17; this is a patient we discharged from the clinic a month ago. She has a recurrent open wound on her medial left calf. She had 15 mm support stockings. I told her I thought she needed 20-30 mm compression stockings. She tells me that she has been ill with hospitalization secondary to asthma and is been found to have severe hypokalemia likely secondary to a combination of Lasix and metolazone. This morning she noted blistering and leaking fluid on the posterior part of her left leg. She called our intake nurse urgently and we was saw her this afternoon. She has not had any real discomfort here. I don't know that she's been wearing any stockings on this leg for at least 2-3 days. ABIs in this clinic were 1.21 on the right and 1.3 on the left. She is previously seen vascular surgery who does not think that there is a peripheral arterial issue. 01/30/17; Patient arrives with no open wound on the left leg. She has been to elastic therapy and obtained 20-25mmhg below  knee stockings and she has one on the right leg today. READMISSION 02/19/18; this Estorga is a now 76 year old patient we've had in this clinic perhaps 3 times before. I had last looked at her from January 07 December 2016 with an area on the medial left leg. We discharged her on 12/25/16 however  she had to be readmitted on 01/22/17 with a recurrence. I have in my notes that we discharged her on 20-30 mm stockings although she tells me she was only wearing support hose because she cannot get stockings on predominantly related to her cervical spine surgery/issues. She has had previous ablations done by vein and vascular in [place] including a great saphenous vein ablation on the left with an anterior accessory branch ablation I think both of these were in 2016. On one of the previous visit she had a biopsy noted 2009 that was negative. She is not felt to have an arterial issue. She is not a diabetic. She does have a history of obstructive sleep apnea hypertension asthma as well as chronic venous insufficiency and lymphedema. TASHEEMA, PERRONE ([id number]) On this occasion she noted 2 dry scaly patch on her left leg. She tried to put lotion on this it didn't really help. There were 2 open areas.the patient has been seeing her primary physician from 02/05/18 through 02/14/18. She had Unna boots applied. The superior wound now on the lateral left leg has closed but she's had one wound that remains open on the lateral left leg. This is not the same spot as we dealt with in 2018. ABIs in this clinic were 1.3 bilaterally 02/26/18; patient has a small wound on the left lateral calf. Dimensions are down. She has chronic venous insufficiency and lymphedema. 03/05/18; small open area on the left lateral calf. Dimensions are down. Tightly adherent necrotic debris over the surface of the wound which was difficult to remove. Also the dressing [over collagen] stuck to the wound surface. This was removed with some difficulty as well. Change the primary dressing to Hydrofera Blue ready 03/12/18; small open area on the left lateral calf. Comes in with tightly adherent surface eschar as well as some adherent Hydrofera Blue. 03/19/18; open area on the left lateral calf. Again adherent surface eschar as well as some  adherent Hydrofera Blue nonviable subcutaneous tissue. She complained of pain all week even with the reduction from 4-3 layer compression I put on last week. Also she had an increase in her ankle and calf measurements probably related to the same thing. 03/26/18; open area on the left lateral calf. A very small open area remains here. We used silver alginate starting last week as the Hydrofera Blue seem to stick to the wound bed. In using 4-layer compression 04/02/18; the open area in the left lateral calf at some adherent slough which I removed there is no open area here. We are able to transition her into her own compression stocking. Truthfully I think this is probably his support hose. However this does not maintain skin integrity will be limited. She cannot put over the toe compression stockings on because of neck problems hand problems etc. She is allergic to the lining layer of juxta lites. We might be forced to use extremitease stocking should this fail READMIT 11/24/2018 Patient is now a 75 year old woman who is not a diabetic. She has been in this clinic on at least 3 previous occasions largely with recurrent wounds on her left leg secondary to chronic venous insufficiency with secondary lymphedema. Her  situation is complicated by inability to get stockings on and an allergy to neoprene which is apparently a component and at least juxta lites and other stockings. As a result she really has not been wearing any stockings on her legs. She tells [place] that roughly 2 or 3 weeks ago she started noticing a stinging sensation just above her ankle on the left medial aspect. She has been diagnosed with pseudogout and she wondered whether this was what she was experiencing. She tried to dress this with something she bought at the store however subsequently it pulled skin off and now she has an open wound that is not improving. She has been using Vaseline gauze with a cover bandage. She saw her primary  doctor last week who put an Haematologist on her. ABIs in this clinic was 1.03 on the left 2/12; the area is on the left medial ankle. Odd-looking wound with what looks to be surface epithelialization but a multitude of small petechial openings. This clearly not closed yet. We have been using silver alginate under 3 layer compression with TCA 2/19; the wound area did not look quite as good this week. Necrotic debris over the majority of the wound surface which required debridement. She continues to have a multitude of what looked to be small petechial openings. She reminds [place] that she had a biopsy on this initially during her first outbreak in 2015 in [place] [facility]. She expresses concern about this being a possible melanoma. 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 I assured her that this does not look anything like melanoma. Except for the petechial reaction it does look like a venous insufficiency area and she certainly has evidence of this on both sides 2/26; a difficult area on the left medial ankle. The patient clearly has chronic venous hypertension with some degree of lymphedema. The odd thing about the area is the small petechial hemorrhages. I am not really sure how to explain this. This was present last time and this is not a compression injury. We have been using Hydrofera Blue which I changed to last week 3/4; still using Hydrofera Blue. Aggressive debridement today. She does not have known arterial issues. She has seen Dr. Kellie Simmering at [facility] and and has an ablation on the left. [Anterior accessory branch of the greater saphenous]. From what I remember they did not feel she had an arterial issue. The patient has had this area biopsied in 2009 at [facility] dermatology and by her recollection they said this was "stasis". She is also follow-up with dermatology locally who  thought that this was more of a vascular issue 3/11; using Hydrofera Blue. Aggressive debridement today. She does not have an arterial issue. We are using 3 layer compression although we may need to go to 4. The patient has been in for multiple changes to her wrap since I last saw her a week ago. She says that the area was leaking. I do not have too much more information on what was found 01/19/19 on evaluation today patient was actually being seen for a nurse visit when unfortunately she had the area on her left lateral lower extremity as well as weeping from the right lower extremity that became apparent. Therefore we did end up actually seeing her for a full visit with myself. She is having some pain at this site as well but fortunately nothing too significant at this  point. No fevers, chills, nausea, or vomiting noted at this time. 3/18-Patient is back to the clinic with the left leg venous leg ulcer, the ulcer is larger in size, has a surface that is densely adherent with fibrinous tissue, the Hydrofera Blue was used but is densely adherent and there was difficulty in removing it. The right lower extremity was also wrapped for weeping edema. Patient has a new area over the left lateral foot above the malleolus that is small and appears to have no debris with intact surrounding skin. Patient is on increased dose of Lasix also as a means to edema management 3/25; the patient has a nonhealing venous ulcer on the medial left leg and last week developed a smaller area on the lateral left calf. We have been using Hydrofera Blue with a contact layer. 4/1; no major change in these wounds areas. Left medial and more recently left lateral calf. I tried Iodoflex last week to aid in debridement she did not tolerate this. She stated her pain was terrible all week. She took the top layer of the 4 layer compression off. 4/8; the patient actually looks somewhat better in terms of her more prominent left lateral  calf wound. There is some healthy looking tissue here. She is still complaining of a lot of discomfort. 4/15; patient in a lot of pain secondary to sciatica. She is on a prednisone taper prescribed by her primary physician. She has the 2 areas one on the left medial and more recently a smaller area on the left lateral calf. Both of these just above the malleoli 4/22; her back pain is better but she still states she is very uncomfortable and now feels she is intolerant to the [employer]. No real change in the wounds we have been using Sorbact. She has been previously intolerant to Iodoflex. There is not a lot of option about what we can use to debride this wound under compression that she no doubt needs. sHe states Ultram no longer works for her pain 4/29; no major change in the wounds slightly increased depth. Surface on the original medial wound perhaps somewhat improved however the more recent area on the lateral left ankle is 100% covered in very adherent debris we have been using Sorbact. She tolerates 4 layer compression well and her edema control is a lot better. She has not had to come in for a nurse check 5/6; no major change in the condition of the wounds. She did consent to debridement today which was done with some difficulty. Continuing Sorbact. She did not tolerate Iodoflex. She was in for a check of her compression the day after we wrapped her last week this was adjusted but nothing much was found 5/13; no major change in the condition or area of the wounds. I was able to get a fairly aggressive debridement done on the lateral left leg [place] ([id number]) wound. Even using Sorbact under compression. She came back in on Friday to have the wrap changed. She says she felt uncomfortable on the lateral aspect of her ankle. She has a long history of chronic venous insufficiency including previous ablation surgery on this side. 5/20-Patient returns for wounds on left leg with both  wounds covered in slough, with the lateral leg wound larger in size, she has been in 3 layer compression and felt more comfortable, she describes pain in ankle, in leg and pins and needles in foot, and is about to try Pamelor for this 6/3; wounds on  the left lateral and left medial leg. The area medially which is the most recent of the 2 seems to have had the largest increase in dimensions. We have been using Sorbac to try and debride the surface. She has been to see orthopedics they apparently did a plain x-ray that was indeterminant. Diagnosed her with neuropathy and they have ordered an MRI to determine if there is underlying osteomyelitis. This was not high on my thought list but I suppose it is prudent. We have advised her to make an appointment with vein and vascular in [place]. She has a history of a left greater saphenous and accessory vein ablations I wonder if there is anything else that can be done from a surgical point of view to help in these difficult refractory wounds. We have previously healed this wound on one occasion but it keeps on reopening [medial side] 6/10; deep tissue culture I did last week I think on the left medial wound showed both moderate E. coli and moderate staph aureus [MSSA]. She is going to require antibiotics and I have chosen Augmentin. We have been using Sorbact and we have made better looking wound surface on both sides but certainly no improvement in wound area. She was back in last Friday apparently for a dressing changes the wrap was hurting her outer left ankle. She has not managed to get a hold of vein and vascular in [place]. We are going to have to make her that appointment 6/17; patient is tolerating the Augmentin. She had an MRI that I think was ordered by orthopedic surgeon this did not show osteomyelitis or an abscess did suggest cellulitis. We have been using Sorbact to the lateral and medial ankles. We have been trying to arrange a follow-up  appointment with vein and vascular in [place] or did her original ablations. We apparently an area sent the request to vein and vascular in [facility] 6/24; patient has completed the Augmentin. We do not yet have a vein and vascular appointment in [place]. I am not sure what the issue is here we have asked her to call tomorrow. We are using Sorbact. Making some improvements and especially the medial wound. Both surfaces however look better medial and lateral. 7/1; the patient has been in contact with vein and vascular in [place] but has not yet received an appointment. Using Sorbact we have gradually improve the wound surface with no improvement in surface area. She is approved for Apligraf but the wound surface still is not completely viable. She has not had to come in for a dressing change 7/8; the patient has an appointment with vein and vascular on 7/31 which is a Friday afternoon. She is concerned about getting back here for [place] to dress her wounds. I think it is important to have them goal for her venous reflux/history of ablations etc. to see if anything else can be done. She apparently tested positive for 1 of the blood tests with regards to lupus and saw a rheumatologist. He has raised the issue of vasculitis again. I have had this thought in the past however the evidence seems overwhelming that this is a venous reflux etiology. If the rheumatologist tells me there is clinical and laboratory investigation is positive for lupus I will rethink this. 7/15; the patient's wound surfaces are quite a bit better. The medial area which was her original wound now has no depth although the lateral wound which was the more recent area actually appears larger. Both with viable surfaces which  is indeed better. Using Sorbact. I wanted to use Apligraf on her however there is the issue of the vein and vascular appointment on 7/31 at 2:00 in the afternoon which would not allow her to get back to be  rewrapped and they would no doubt remove the graft 7/22; the patient's wound surfaces have moderate amount of debris although generally look better. The lateral one is larger with 2 small satellite areas superiorly. We are waiting for her vein and vascular appointment on 7/31. She has been approved for Apligraf which I would like to use after th 7/29; wound surfaces have improved no debridement is required we have been using Sorbact. She sees vein and vascular on Friday with this so question of whether anything can be done to lessen the likelihood of recurrence and/or speed the healing of these areas. She is already had previous ablations. She no doubt has severe venous hypertension 8/5-Patient returns at 1 week, she was in [place] for 3 days by her podiatrist, we have been using so backed to the wound, she has increased pain in both the wounds on the left lower leg especially the more distal one on the lateral aspect 8/12-Patient returns at 1 week and she is agreeable to having debridement in both wounds on her left leg today. We have been using Sorbact, and vascular studies were reviewed at last visit 8/19; the patient arrives with her wounds fairly clean and no debridement is required. We have used Sorbact which is really done a nice job in cleaning up these very difficult wound surfaces. The patient saw Dr. Donzetta Matters of vascular surgery on 7/31. He did not feel that there was an arterial component. He felt that her treated greater saphenous vein is adequately addressed and that the small saphenous vein did not appear to be involved significantly. She was also noted to have deep venous reflux which is not treatable. Dr. Donzetta Matters mentioned the possibility of a central obstructive component leading to reflux and he offered her central venography. She wanted to discuss this or think about it. I have urged her to go ahead with this. She has had recurrent difficult wounds in these areas which do heal but  after months in the clinic. If there is anything that can be done to reduce the likelihood of this I think it is worth it. 9/2 she is still working towards getting follow-up with Dr. Donzetta Matters to schedule her CT. Things are quite a bit worse venography. I put Apligraf on 2 weeks ago on both wounds on the medial and lateral part of her left lower leg. She arrives in clinic today with 3 superficial additional wounds above the area laterally and one below the wound medially. She describes a lot of discomfort. I think these are probably wrapped injuries. Does not look like she has cellulitis. 07/20/2019 on evaluation today patient appears to be doing somewhat poorly in regard to her lower extremity ulcers. She in fact showed signs of erythema in fact we may even be dealing with an infection at this time. Unfortunately I am unsure if this is just infection or if indeed there may be some allergic reaction that occurred as a result of the Apligraf application. With that being said that would be unusual but nonetheless not impossible in this patient is one who is unfortunately allergic to quite a bit. Currently we have been using the Sorbact which seems to do as well as anything for her. I do think we may want to  obtain a culture today to see if there is anything showing up there that may need to be addressed. 9/16; noted that last week the wounds look worse in 1 week follow-up of the Apligraf. Using Sorbact as of 2 days ago. She arrives with copious amounts of drainage and new skin breakdown on the back of the left calf. The wounds arm more substantial bilaterally. There is a fair amount of swelling in the left calf no overt DVT there is edema present I think in the left greater than right thigh. She is supposed to go on 9/28 for CT venography. The wounds on the medial and lateral calf are worse and she has new skin breakdown posteriorly at least new for me. This is almost developing into a circumferential wound  area The Apligraf was taken off last week which I agree with things are not going in the right direction a culture was done we do not have that back yet. She is on Augmentin that she started 2 days ago 9/23; dressing was changed by her nurses on Monday. In general there is no improvement in the wound areas although the area looks less angry than last week. She did get Augmentin for MSSA cultured on the 14th. She still appears to have too much swelling in the left leg even with 3 layer compression 9/30; the patient underwent her procedure on 9/28 by Dr. Donzetta Matters at vascular and vein specialist. She was discovered to have the common iliac vein measuring 12.2 mm but at the level of L4-L5 measured 3 mm. After stenting it measured 10 mm. 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 Rouleaux flow in the common femoral and femoral vein was observed much improved after stenting. We are using silver alginate to the wounds on the medial and lateral ankle on the left. 4 layer compression 10/7; the patient had fluid swelling around her knee and 4 layer compression. At the advice of vein and vascular this was reduced to 3 layer which she is tolerating better. We have been using silver alginate under 3 layer compression since last Friday ANGELIGUE, BOWNE ([id number]) 10/14; arrives with the areas on the left ankle looking a lot better. Inflammation in the area also a lot better. She came in for a nurse check on 10/9 10/21; continued nice improvement. Slight improvements in surface area of both the medial and lateral wounds on the left. A lot of the satellite lesions in the weeping erythema around these from stasis dermatitis is resolved. We have been using silver alginate 10/28; general improvement in the entire wound areas although not a lot of change in dimensions the wound certainly looks better. There is a lot less in terms of venous inflammation. Continue silver alginate this week however look  towards Hydrofera Blue next week 11/4; very adherent debris on the medial wound left wound is not as bad. We have been using silver alginate. Change to [facility] today 11/11; very adherent debris on both wound areas. She went to vein and vascular last week and follow-up they put in [place] boot on this today. He says the [facility] was adherent. Wound is definitely not as good as last week. Especially on the left there the satellite lesions look more prominent 11/18; absolutely no better. erythema on lateral aspect with tenderness. 09/30/2019 on evaluation today patient appears to actually be doing better. Dr. Dellia Nims did put her on doxycycline last week which I do believe has helped her at this point.  Fortunately there is no signs of active infection at this time. No fevers, chills, nausea, vomiting, or diarrhea. I do believe he may want extend the doxycycline for 7 additional days just to ensure everything does completely cleared up the patient is in agreement with that plan. Otherwise she is going require some sharp debridement today 12/2; patient is completing a 2-week course of doxycycline. I gave her this empirically for inflammation as well as infection when I last saw her 2 weeks ago. All of this seems to be better. She is using silver alginate she has the area on the medial aspect of the larger area laterally and the 2 small satellite regions laterally above the major wound. 12/9; the patient's wound on the left medial and left lateral calf look really quite good. We have been using silver alginate. She saw vein and vascular in follow-up on 10/09/2019. She has had a previous left greater saphenous vein ablation by Dr. Oscar La in 2016. More recently she underwent a left common iliac vein stent by Dr. 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 The swelling is improved and certainly the wounds have improved. The patient shows [place] today area on the right medial calf there is almost no  wound but leaking lymphedema. She says she start this started 3 or 4 days ago. She did not traumatize it. It is not painful. She does not wear compression on that side 12/16; the patient continues to do well laterally. Medially still requiring debridement. The area on the right calf did not materialize to anything and is not currently open. We wrapped this last time. She has support stockings for that leg although I am not sure they are going to provide adequate compression 12/23; the lateral wound looks stable. Medially still requiring debridement for tightly adherent fibrinous debris. We've been using silver alginate. Surface area not any different 12/30; neither wound is any better with regards to surface and the area on the left lateral is larger. I been using silver alginate to the left lateral which look quite good last week and Sorbact to the left medial 11/11/2019. Lateral wound area actually looks better and somewhat smaller. Medial still requires a very aggressive debridement today. We have been using Sorbact on both wound areas 1/13; not much better still adherent debris bilaterally. I been using Sorbact. She has severe venous hypertension. Probably some degree of dermal fibrosis distally. I wonder whether tighter compression might help and I am going to try that today. We also need to work on the bioburden 1/20; using Sorbact. She has severe venous hypertension status post stent placement for pelvic vein compression. We applied gentamicin last time to see if we could reduce bioburden I had some discussion with her today about the use of pentoxifylline. This is occasionally used in this setting for wounds with refractory venous insufficiency. However this interacts with Plavix. She tells me that she was put on this after stent placement for 3 months. She will call Dr. Claretha Cooper office to discuss 1/27; we are using gentamicin under Sorbact. 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 She has a stent. Wound medially is measuring smaller this week. Laterally measuring slightly larger although she has some satellite lesions superiorly 2/3; gentamicin under Sorbact under 4-layer compression. 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 She has a stent on Plavix. Her wounds are measuring smaller this week. More substantially laterally where there is a satellite lesion superiorly. 2/10; gentamicin under Sorbac. 4-layer  compression. Patient communicated with Dr. Donzetta Matters at vein and vascular in [place]. He is okay with the patient coming off Plavix I will therefore start her on pentoxifylline for a 1 month trial. In general her wounds look better today. I had some concerns about swelling in the left thigh however she measures 61.5 on the right and 63 on the mid thigh which does not suggest there is any difficulty. The patient is not describing any pain. 2/17; gentamicin under Sorbac 4-layer compression. She has been on pentoxifylline for 1 week and complains of loose stool. No nausea she is eating and drinking well 2/24; the patient apparently came in 2 days ago for a nurse visit when her wrap fell down. Both areas look a little worse this week macerated medially and satellite lesions laterally. Change to silver alginate today 3/3; wounds are larger today especially medially. She also has more swelling in her foot lower leg and I even noted some swelling in her posterior thigh which is tender. I wonder about the patency of her stent. Fortuitously she sees Dr. Claretha Cooper group on Friday 3/10; Mrs. Stallings was seen by vein and vascular on 3/5. The patient underwent ultrasound. There was no evidence of thrombosis involving the IVC no evidence of thrombosis involving the right common iliac vein there is no evidence of thrombosis involving the right external iliac vein the left external vein is also patent. The right common iliac vein stent appears patent  bilateral common femoral veins are compressible and appear patent. I was concerned about the left common iliac stent however it looks like this is functional. She has some edema in the posterior thigh that was tender she still has that this week. I also note they had trouble finding the pulses in her left foot and booked her for an ABI baseline in 4 weeks. She will follow up in 6 months for repeat IVC duplex. The patient stopped the pentoxifylline because of diarrhea. It does not look like that was being effective in any case. I have advised her to go back on her aspirin 81 mg tablet, vascular it also suggested this 3/17; comes in today with her wound surfaces a lot better. The excoriations from last week considerably better probably secondary to the TCA. We have been using silver alginate 3/24; comes in today with smaller wounds both medially and laterally. Both required debridement. There are 2 small satellite areas superiorly laterally. She also has a very odd bandlike area in the mid calf almost looking like there was a weakness in the wrap in a localized area. I would write this off as being this however anteriorly she has a small raised ballotable area that is very tender almost reminiscent of an abscess but there was no obvious purulent surface to it. GUILLERMO, DIFRANCESCO ([id number]) 02/04/20 upon evaluation today patient appears to be doing fairly well in regard to her wounds today. Fortunately there is no signs of active infection at this time. No fevers, chills, nausea, vomiting, or diarrhea. She has been tolerating the dressing changes without complication. Fortunately I feel like she is showing signs of improvement although has been sometime since have seen her. Nonetheless the area of concern that Dr. Dellia Nims had last week where she had possibly an area of the wrap that was we can allow the leg to bulge appears to be doing significantly better today there is no signs of anything  worsening. 4/7; the patient's wounds on her medial and lateral left leg continue to contract.  We have been using a regular alginate. Last week she developed an area on the right medial lower leg which is probably a venous ulcer as well. 4/14; the wounds on her left medial and lateral lower leg continue to contract. Surface eschar. We have been using regular alginate. The area on the right medial lower leg is closed. We have been putting both legs under 4-layer contraction. The patient went back to see vein and vascular she had arterial studies done which were apparently "quite good" per the patient although I have not read their notes I have never felt she had an arterial issue. 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 This is been longstanding and refractory to exercise, leg elevation and longstanding use of compression wraps in our clinic as well as compression stockings on the times we have been able to get these to heal 4/21; we thought she actually might be close this week however she arrives in clinic with a lot of edema in her upper left calf and into her posterior thigh. This is been an intermittent problem here. She says the wrap fell down but it was replaced with a nurse visit on Monday. We are using calcium alginate to the wounds and the wound sizes there not terribly larger than last week but there is a lot more edema 4/28; again wound edges are smaller on both sides. Her edema is better controlled than last time. She is obtained her compression pumps from [employer] although they have not been to her home to set these up. 5/5; left medial and left lateral both look stable. I am not sure the medial is any smaller. We have been using calcium alginate under 4-layer compression. She had an area on the right medial. This was eschared today. We have been wrapping this as well. She does not tolerate external compression stockings due to a  history of various contact allergies. She has her compression pumps however the representative from the company is coming on her to show her how to use these tomorrow 5/19; patient with severe chronic venous insufficiency secondary to central venous disease. She had a stent placed in her left common iliac vein. She has done better since but still difficult to control wounds. She comes in today with nothing open on the right leg. Her areas on the left medial and left lateral are just about closed. We are using calcium alginate under 4-layer compression. She is using her external compression pumps at home She only has 15-20 support stockings. States she cannot get anything tighter than that on. 03/30/20-Patient returns at 1 week, the wounds on the left leg are both slightly bigger, the last week she was on 3 layer compression which started to slide down. She is starting to use her lymphedema pumps although she stated on 1 day her right ankle started to swell up and she have to stop that day. Unfortunately the open area seem to oscillate between improving to the point of healing and then flaring up all to do with effectiveness of compression or lack of due to the left leg topography not keeping the compression wraps from rolling down 6/2 patient comes in with a 15/20 mmHg stocking on the right leg. She tells me that she developed a lot of swelling in her ankles she saw orthopedics she was felt to possibly be having a flare of pseudogout versus some other type of arthritis. She was put on steroids for a respiratory issue so  that helps with the inflammation. She has not been using the pumps all week. She thinks the left thigh is more swollen than usual and I would agree with that. She has an appointment with Dr. Donzetta Matters 9 days or so from now 6/9; both wounds on the left medial and left lateral are smaller. We have been using calcium alginate under compression. She does not have an open wound on the right leg  she is using a stocking and her compression pumps things are going well. She has an appointment with Dr. Donzetta Matters with regards to her stent in the left common iliac vein 6/16; the wounds on the left medial and left lateral ankle continues to contract. The patient saw Dr. Donzetta Matters and I think he seems satisfied. Ordered follow-up venous reflux studies on both sides in September. Cautioned that she may need thigh-high stockings. She has been using calcium alginate under compression on the left and her own stocking on the right leg. She tells [place] there are no open wounds on the right 6/23; left lateral is just about closed. Medial required debridement today. We have been using calcium alginate. Extensive discussion about the compression pumps she is only using these on 25 mmHg states she could not take 40 or 30 when the wrap came out to her home to demonstrate these. He said they should not feel tight 6/30; the left lateral wound has a slight amount of eschar. . The area medially is about the same using Hydrofera Blue. 7/7; left lateral wound still has some eschar. I will remove this next week may be closed. The area medially is very small using Hydrofera Blue with improvement. Unfortunately the stockings fell down. Unfortunately the blisters have developed at the edge of where the wrap fell. When this happened she says her legs hurt she did not use her pumps. We are not open Monday for her to come in and change the wraps and she had an appointment yesterday. She also tells me that she is going to have an MRI of her back. She is having pain radiating into her left anterior leg she thinks her from an L5 disc. She saw Dr. Ellene Route of neurosurgery 7/14; the area on the left lateral ankle area is closed. Still a small area medially however it looks better as well. We have been using Hydrofera Blue under 4-layer compression 7/21; left lateral ankle is still closed however her wound on the medial left calf is actually  larger. This is probably because Hydrofera Blue got stuck to the wound. She came in for a nurse change on Friday and will do that again this week I was concerned about the amount of swelling that she had last week however she is using her compression pumps twice a day and the swelling seems well controlled 7/28; remaining wound on the left medial lower leg is smaller. We have been using moistened silver collagen under compression she is coming back for a nurse visit. For reasons that were not really clear she was just keeping her legs elevated and not using her compression pumps. I have asked her to use the compression pumps. She does not have any wounds on the right leg 06/15/20-Patient returns at 2 weeks, her LLE edema is worse and she developed a blister wound that is new and has bigger posterior calf wound on right, we are using Prisma with pad, 4 layer compression. she has been on lasix 40 mg daily 8/18; patient arrives today with things a lot  worse than I remember from a few weeks ago. She was seen last week. Noted that her edema was worse and that she now had a left lateral wound as well as deteriorating edema in the medial and posterior part of the lower leg. She says she is using his or her external compression pumps once a day although I wonder about the compliance. 8/25; weeping area on the right medial lower leg. This had actually gotten a small localized area of her compression stocking wet. On the left side there is a large denuded area on the posterior medial lower leg and smaller area on the lateral. This was not the original areas that we dealt with. 9/1 the patient's wound on the left leg include the left lateral and left posterior. Larger superficial wounds weeping. She has very poor edema control. Tender localized edema in the left lower medial ankle/heel probably because of localized wrap issues. She freely admits she is not using the compression pumps. She has been up on her feet  a lot. She thinks the hydrofera blue is contributing to the pain she is experiencing.. This is a complaint that I have occasionally heard KARLEI, WALDO ([id number]) 9/8; really not much improvement. The patient is still complaining of a lot of pain particularly when she uses compression pumps. I switched her to silver alginate last time because she found the Hydrofera Blue to be irritating. I don't hear much difference in her description with the silver alginate. 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 She has follow-up with Dr. Donzetta Matters I think for ultrasound next month 9/15; quite a bit of improvement today. We have less edema and more epithelialization in both of her wound areas on the left medial and left lateral calf. These are not the site of her original wounds in this area. She says she has been using her compression pumps for 30 minutes twice a day, there pain issues that never quite understood. Silver alginate as the primary dressing 9/22; continued improvement. Both areas medially and laterally still have a small open area there is some eschar. She continues to complain of left medial ankle pain. Swelling in the leg is in much better condition. We have been using silver alginate 9/29; continued improvement. Both areas medially and laterally in the left calf look as though they are close some minor surface eschar but I think this is epithelialized. She comes in today saying she has a ruptured disc at L4-L5 cannot bend over to put on her stockings. 10/6; patient comes in today with no open wounds on either leg. However her edema on the left leg in the upper one third of the lower leg is poorly controlled nonpitting. She says that she could not use the pumps for 2 days and then she has been using the last couple of days. It is not clear to me she has been able to get her stocking on. She has back problems. Mrs.  Medders has severe chronic venous insufficiency with secondary lymphedema. Her venous insufficiency is partially centrally mediated and that she is now post stent in the left common iliac vein. [facility] Thurner's syndrome/physiology]. She follows up with them on 10/15. She wears 20/30 below-knee stockings. She is supposed to use compression pumps at home although I think her compliance about with this is been less than 100%. I have asked her to use these 3 times a day.  Finally I think she has lipodermatosclerosis in the left lower leg with an inverted bottle sign. It is been a major problem controlling the edema in the left leg. The right leg we have had wounds on but not as significant a problem is on the left READMISSION 04/12/2021 Mrs. Amster is a 75 year old woman we know well in this clinic. She has severe chronic venous insufficiency. 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 I believe she has had bilateral greater saphenous vein ablation in the past as well. She tells me that this wound opened sometime in March. She had a fall and thinks it was initially abrasion. She developed areas she describes as little blisters on the anterior part of her leg and she saw dermatology and was treated for methicillin staph aureus with several rounds of antibiotics. She has been using support stockings on the left leg and says this is the only thing she can get on. Her compression pump use maybe once a day she says if she did not use one she use the other. She comes in today with incredible swelling in the left leg with a wound on the left posterior calf. She has been using Neosporin to this previously a hydrocolloid. 6/15; patient arrives back for 1 week follow-up.Marland Kitchen Apparently her wrap fell down she did not call us to replace this. He has poor edema control. She only uses her compression pumps once a day 6/29; patient presents for 1 week follow-up. She has tolerated the  compression wrap well. We have been using Iodoflex under the wrap. She has no issues or complaints today. 7/6; patient presents for 1 week follow-up. She states that the compression wrap rolled down her leg 4 days ago. She has been trying to keep the area covered but has no dressings at home to use. She denies signs of infection. 7/13; patient presents for 1 week follow-up. She states that her compression wrap rolled down her right leg and she called our office and had it placed a few days ago. She has been tolerating the current wrap well. She states that the Iodoflex is causing a burning sensation. She denies signs of infection. 7/27; patient presents for 1 week follow-up She has tolerated the compression wrap well with Hydrofera Blue underneath. She has now developed a wound to her right lower extremity. She reports having a culture done To this area by her dermatologist that she reports is negative. She currently denies signs of infection. 8/30; patient presents for 1 week follow-up. On the left side she has tolerated the compression wrap well with Hydrofera Blue underneath. She reports some discomfort to her right lower extremity with the 3 layer compression. She currently denies signs of infection. 06/14/2021 upon evaluation today patient's wound actually appears to be doing decently well based on what I am seeing currently. She actually has 2 areas 1 on the left distal/posterior lower leg and the other on the right lower leg. Subsequently the measurements are roughly the same may be slightly smaller but in general have not made any trend towards getting worse which is great news. 06/23/2021 upon evaluation today patient appears to be doing pretty well in regard to her wounds. On the right she is having difficulty with sciatic pain and this subsequently has led to her taking the wrap off over the last week her leg is more swollen she is also been on prednisone for 10 days. With that being said I  think we may want to just use a compression sock on the left that way she will not have to worry about the compression wrap being in place that she cannot get off. With that being said I do believe as well that the patient is going to need to continue with the wrapping on the left we will also need to do a little bit of sharp debridement today. 8/24; patient presents for 1 week follow-up. She has no issues or complaints today. She denies signs of infection. 8/31; patient presents for 1 week follow-up. She has no issues or complaints today. She has tolerated the compression wrap well on the left lower extremity. She denies signs of infection. 9/7; patient presents for follow-up. She reports that the compression wrap rolled down slightly on her right lower extremity. She reports tenderness to the wound bed. She denies increased warmth or erythema to the surrounding skin. She overall feels well. 9/14; patient presents for follow-up. She has no issues or complaints today. She denies signs of infection. PuraPly is available and patient would like to start this today. 9/21; patient presents for follow-up. She has no issues or complaints today. She tolerated the first skin substitute placement last week under compression. 9/28; patient presents for 1 week follow-up. She has no issues or complaints today. 10/5; patient presents for 1 week follow-up. She reports taking the wrap off yesterday. She was on her feet more yesterday and developed more swelling to her left lower extremity. She denies pain. Overall she is doing well. KHYLEI, WILMS ([id number]) 10/12; patient presents for 1 week follow-up. She has no issues or complaints today. 10/19; patient presents for follow-up. She has no issues or complaints today. She tolerated the compression wrap well. She states she has compression stockings at home. Readmission: 11/27/2021 upon evaluation today patient appears to be doing poorly in regard to the wounds  on her right leg. She also has an area of erythema in the proximal calf area of the right leg which again I do believe is a issue from the standpoint of it being warm and erythematous to touch and also seems to be somewhat localized I really think this may be more of a cellulitis issue than anything else. Fortunately I do not see any signs of active infection Systemically at this time. 12/05/2021 upon evaluation patient's wounds actually appear to be showing some signs of improvement which is not too bad and just 1 week's time. Especially since we had to get on the right antibiotic which is only been for the past 4 days. Fortunately I do not see any signs of active infection locally nor systemically at this time that has gotten any worse. Overall I think that again with the antibiotic she is significantly improved and currently she is taking Levaquin. 12/11/2021 upon evaluation today patient appears to be doing well with regard to her leg. Between the antibiotics and the triamcinolone I feel like she is doing significantly better which is great news. Overall I am extremely pleased with where we stand. There does not appear to be any signs of active infection locally or systemically at this time. 12/18/2021 upon evaluation today patient appears to be doing well with regard to her wound. I do feel like this is showing signs of some improvement here. Little by little this is clearing up quite nicely. I think it is just a slow process but nonetheless 1 that we are seeing evidence of improvement with regard to. 12/25/2021 upon evaluation today  patient appears to be doing well with regard to her wounds she is definitely making some good progress here. Fortunately I do not see any signs of active infection locally or systemically at this time which is great news. No fevers, chills, nausea, vomiting, or diarrhea. 01/02/2022 upon evaluation today patient appears to be doing well with regard to her wound. She has  been tolerating the dressing changes without complication. Fortunately I do not see any evidence of active infection locally or systemically which is great news and overall I am extremely pleased with where we stand today. Objective Constitutional [name] and well-hydrated in no acute distress. Vitals Time Taken: 3:39 PM, Height: 62 in, Weight: 199 lbs, BMI: 36.4, Temperature: 98.9 F, Pulse: 77 bpm, Respiratory Rate: 16 breaths/min, Blood Pressure: 166/89 mmHg. General Notes: 174/92 will recheck; 166/89 Respiratory normal breathing without difficulty. Psychiatric this patient is able to make decisions and demonstrates good insight into disease process. Alert and Oriented x 3. pleasant and cooperative. General Notes: Patient's wound bed showed evidence of good granulation and epithelization at this point. Fortunately I think that we are headed in the right direction and overall I think that the patient is showing signs of improvement week by week even though it may be slow. Integumentary (Hair, Skin) Wound #15 status is Open. Original cause of wound was Gradually Appeared. The date acquired was: 10/27/2021. The wound has been in treatment 5 weeks. The wound is located on the Right,Posterior Lower Leg. The wound measures 6.5cm length x 6cm width x 0.3cm depth; 30.631cm^2 area and 9.189cm^3 volume. There is Fat Layer (Subcutaneous Tissue) exposed. There is a large amount of serosanguineous drainage noted. The wound margin is indistinct and nonvisible. There is medium (34-66%) pink, pale granulation within the wound bed. There is a medium (34-66%) amount of necrotic tissue within the wound bed including Adherent Slough. Wound #16 status is Open. Original cause of wound was Gradually Appeared. The date acquired was: 10/26/2021. The wound has been in treatment 1 weeks. The wound is located on the Right,Medial Lower Leg. The wound measures 2cm length x 1.8cm width x 0.3cm depth; 2.827cm^2  area and 0.848cm^3 volume. There is Fat Layer (Subcutaneous Tissue) exposed. There is a medium amount of serosanguineous drainage noted. There is medium (34-66%) pink, hyper - granulation within the wound bed. There is a medium (34-66%) amount of necrotic tissue within the wound bed including Adherent Slough. SAHANA, BOYLAND ([id number]) Assessment Active Problems ICD-10 Lymphedema, not elsewhere classified Chronic venous hypertension (idiopathic) with ulcer and inflammation of right lower extremity Non-pressure chronic ulcer of other part of right lower leg limited to breakdown of skin Procedures Wound #15 Pre-procedure diagnosis of Wound #15 is a Lymphedema located on the Right,Posterior Lower Leg . There was a Three Layer Compression Therapy Procedure by Cornell Barman, RN. Post procedure Diagnosis Wound #15: Same as Pre-Procedure Notes: patient tolerating wraps well.. Plan Follow-up Appointments: Return Appointment in 1 week. Nurse Visit as needed Bathing/ Shower/ Hygiene: May shower with wound dressing protected with water repellent cover or cast protector. Edema Control - Lymphedema / Segmental Compressive Device / Other: Optional: One layer of unna paste to top of compression wrap (to act as an anchor). Elevate, Exercise Daily and Avoid Standing for Long Periods of Time. Elevate leg(s) parallel to the floor when sitting. DO YOUR BEST to sleep in the bed at night. DO NOT sleep in your recliner. Long hours of sitting in a recliner leads to swelling of the legs and/or potential  wounds on your backside. WOUND #15: - Lower Leg Wound Laterality: Right, Posterior Cleanser: Soap and Water Discharge Instructions: Gently cleanse wound with antibacterial soap, rinse and pat dry prior to dressing wounds Topical: Triamcinolone Acetonide Cream, 0.1%, 15 (g) tube Discharge Instructions: Apply as directed by provider. apply to wound and leg Primary Dressing: Xtrasorb Classic Super Absorbent  Dressing, 6x9 (in/in) Discharge Instructions: over open areas of the leg that are weeping Compression Wrap: Profore Lite LF 3 Multilayer Compression Bandaging System Discharge Instructions: Apply 3 multi-layer wrap as prescribed. WOUND #16: - Lower Leg Wound Laterality: Right, Medial Cleanser: Soap and Water Discharge Instructions: Gently cleanse wound with antibacterial soap, rinse and pat dry prior to dressing wounds Topical: Triamcinolone Acetonide Cream, 0.1%, 15 (g) tube Discharge Instructions: Apply as directed by provider. apply to wound and leg Primary Dressing: Xtrasorb Classic Super Absorbent Dressing, 6x9 (in/in) Discharge Instructions: over open areas of the leg that are weeping Compression Wrap: Profore Lite LF 3 Multilayer Compression Bandaging System Discharge Instructions: Apply 3 multi-layer wrap as prescribed. 1. Would recommend that we going continue with the wound care measures as before and the patient is in agreement with plan. This includes the use of the triamcinolone to the wound beds which I think is definitely helping. 2. Also to continue with Lauraine Rinne which I think is doing a good job. 3. I would also suggest that we continue with the 3 layer compression wrap which I think is doing quite well for her. We will see patient back for reevaluation in 1 week here in the clinic. If anything worsens or changes patient will contact our office for additional recommendations. Electronic Signature(s) Signed: 01/02/2022 4:19:01 PM By: Worthy Keeler PA-C Entered By: Worthy Keeler on 01/02/2022 16:19:01 Savidge, WILMARY LEVIT ([id number]) DORETHEA, STRUBEL ([id number]) -------------------------------------------------------------------------------- SuperBill Details Patient Name: ROSSANNA, SPITZLEY. Date of Service: 01/02/2022 Medical Record Number: [id number] Patient Account Number: [id number] Date of Birth/Sex: 1946/09/05 (76 y.o. F) Treating RN: Levora Dredge Primary Care  Provider: [name] Other Clinician: Referring Provider: [name] Treating Provider/Extender: [name] in Treatment: 5 Diagnosis Coding ICD-10 Codes Code Description I89.0 Lymphedema, not elsewhere classified I87.331 Chronic venous hypertension (idiopathic) with ulcer and inflammation of right lower extremity L97.811 Non-pressure chronic ulcer of other part of right lower leg limited to breakdown of skin Facility Procedures CPT4 Code: [id number] Description: (Facility Use Only) [phone] - [place] [facility] RT LEG Modifier: Quantity: 1 Physician Procedures CPT4 Code Description: [id number] - WC PHYS LEVEL 4 - EST PT Modifier: Quantity: 1 CPT4 Code Description: ICD-10 Diagnosis Description I87.331 Chronic venous hypertension (idiopathic) with ulcer and inflammation of rig I89.0 Lymphedema, not elsewhere classified L97.811 Non-pressure chronic ulcer of other part of right lower leg limited  to brea Modifier: ht lower extremit kdown of skin Quantity: y Engineer, maintenance) Signed: 01/02/2022 4:35:54 PM By: [name], [name], RN, CWS, [name], [name] Signed: 01/02/2022 4:37:47 PM By: Worthy Keeler PA-C Previous Signature: 01/02/2022 4:19:19 PM Version By: Worthy Keeler PA-C Entered By: [name], [name], RN, CWS, Kim on 01/02/2022 16:20:36

## 2022-01-02 NOTE — Progress Notes (Signed)
KOLBY, MYUNG ([id number]) Visit Report for 01/02/2022 Arrival Information Details Patient Name: CAMALA, TALWAR. Date of Service: 01/02/2022 3:45 PM Medical Record Number: [id number] Patient Account Number: [id number] Date of Birth/Sex: 10-07-1946 (76 y.o. F) Treating RN: Cornell Barman Primary Care Jamiria Langill: [name] Other Clinician: Referring Aurthur Wingerter: [name] Treating Nan Maya/Extender: [name] in Treatment: 5 Visit Information History Since Last Visit Has Dressing in Place as Prescribed: Yes Patient Arrived: Ambulatory Has Compression in Place as Prescribed: Yes Arrival Time: 15:36 Pain Present Now: Yes Accompanied By: self Transfer Assistance: None Patient Identification Verified: Yes Secondary Verification Process Completed: Yes Patient Requires Transmission-Based No Precautions: Patient Has Alerts: Yes Patient Alerts: Patient on Blood Thinner 81mg  aspirin Electronic Signature(s) Signed: 01/02/2022 4:35:54 PM By: [name], [name], RN, CWS, [name], [name] Entered By: [name], [name], RN, CWS, Kim on 01/02/2022 15:36:49 Goyal, [name] ([id number]) -------------------------------------------------------------------------------- Compression Therapy Details Patient Name: PHALLON, HAYDU. Date of Service: 01/02/2022 3:45 PM Medical Record Number: [id number] Patient Account Number: [id number] Date of Birth/Sex: 1946-09-01 (76 y.o. F) Treating RN: Cornell Barman Primary Care Nnenna Meador: [name] Other Clinician: Referring Elisama Thissen: [name] Treating Shatha Hooser/Extender: [name] in Treatment: 5 Compression Therapy Performed for Wound Assessment: Wound #15 Right,Posterior Lower Leg Performed By: Clinician Cornell Barman, RN Compression Type: Three Layer Post Procedure Diagnosis Same as Pre-procedure Notes patient tolerating wraps well. Electronic Signature(s) Signed: 01/02/2022 4:35:54 PM By: [name], [name], RN, CWS, [name], [name] Entered By: [name], [name],  RN, CWS, Kim on 01/02/2022 16:02:02 TAMZIN, BERTLING ([id number]) -------------------------------------------------------------------------------- Encounter Discharge Information Details Patient Name: ELIF, YONTS. Date of Service: 01/02/2022 3:45 PM Medical Record Number: [id number] Patient Account Number: [id number] Date of Birth/Sex: 20-Feb-1946 (76 y.o. F) Treating RN: Cornell Barman Primary Care Oshae Simmering: [name] Other Clinician: Referring Anayelli Lai: [name] Treating Mats Jeanlouis/Extender: [name] in Treatment: 5 Encounter Discharge Information Items Discharge Condition: Stable Ambulatory Status: Ambulatory Discharge Destination: Home Transportation: Private Auto Accompanied By: self Schedule Follow-up Appointment: Yes Clinical Summary of Care: Electronic Signature(s) Signed: 01/02/2022 4:35:54 PM By: [name], [name], RN, CWS, [name], [name] Entered By: [name], [name], RN, CWS, Kim on 01/02/2022 16:24:24 [name] ([id number]) -------------------------------------------------------------------------------- Lower Extremity Assessment Details Patient Name: [name], [name]. Date of Service: 01/02/2022 3:45 PM Medical Record Number: [id number] Patient Account Number: [id number] Date of Birth/Sex: September 11, 1946 (76 y.o. F) Treating RN: Cornell Barman Primary Care Caree Wolpert: [name] Other Clinician: Referring Leva Baine: [name] Treating Shekinah Pitones/Extender: Jeri Cos Weeks in Treatment: 5 Edema Assessment Assessed: [Left: No] [Right: No] [Left: Edema] [Right: :] Calf Left: Right: Point of Measurement: 30 cm From Medial Instep 43.5 cm Ankle Left: Right: Point of Measurement: 13 cm From Medial Instep 24.5 cm Knee To Floor Left: Right: From Medial Instep 42 cm Vascular Assessment Pulses: Dorsalis Pedis Palpable: [Right:Yes] Electronic Signature(s) Signed: 01/02/2022 4:35:54 PM By: [name], [name], RN, CWS, [name], [name] Entered By: [name], [name], RN, CWS, Kim  on 01/02/2022 15:54:05 Cumba, [name] ([id number]) -------------------------------------------------------------------------------- Multi Wound Chart Details Patient Name: LOWANA, HABLE. Date of Service: 01/02/2022 3:45 PM Medical Record Number: [id number] Patient Account Number: [id number] Date of Birth/Sex: 1946-07-02 (76 y.o. F) Treating RN: Cornell Barman Primary Care Brittanya Winburn: [name] Other Clinician: Referring Christyna Letendre: [name] Treating Janoah Menna/Extender: [name] in Treatment: 5 Vital Signs Height(in): 80 Pulse(bpm): 70 Weight(lbs): 199 Blood Pressure(mmHg): 166/89 Body Mass Index(BMI): 36.4 Temperature(F): 98.9 Respiratory Rate(breaths/min): 16 Photos: [15:No Photos] [16:No Photos] [N/A:N/A] Wound Location: [15:Right, Posterior Lower Leg] [16:Right, Medial Lower Leg] [  N/A:N/A] Wounding Event: [15:Gradually Appeared] [16:Gradually Appeared] [N/A:N/A] Primary Etiology: [15:Lymphedema] [16:Lymphedema] [N/A:N/A] Secondary Etiology: [15:Venous Leg Ulcer] [16:Venous Leg Ulcer] [N/A:N/A] Comorbid History: [15:Cataracts, Asthma, Sleep Apnea, Deep Vein Thrombosis, Hypertension, Peripheral Venous Disease, Osteoarthritis, Received Chemotherapy, Received Radiation] [16:Cataracts, Asthma, Sleep Apnea, Deep Vein Thrombosis, Hypertension,  Peripheral Venous Disease, Osteoarthritis, Received Chemotherapy, Received Radiation] [N/A:N/A] Date Acquired: [15:10/27/2021] [16:10/26/2021] [N/A:N/A] Weeks of Treatment: [15:5] [16:1] [N/A:N/A] Wound Status: [15:Open] [16:Open] [N/A:N/A] Wound Recurrence: [15:No] [16:No] [N/A:N/A] Measurements L x W x D (cm) [15:6.5x6x0.3] [16:2x1.8x0.3] [N/A:N/A] Area (cm) : [15:30.631] [16:2.827] [N/A:N/A] Volume (cm) : [15:9.189] [16:0.848] [N/A:N/A] % Reduction in Area: [15:67.20%] [16:-71.40%] [N/A:N/A] % Reduction in Volume: [15:1.70%] [16:-413.90%] [N/A:N/A] Classification: [15:Full Thickness Without Exposed Support Structures]  [16:Full Thickness Without Exposed Support Structures] [N/A:N/A] Exudate Amount: [15:Large] [16:Medium] [N/A:N/A] Exudate Type: [15:Serosanguineous] [16:Serosanguineous] [N/A:N/A] Exudate Color: [15:red, brown] [16:red, brown] [N/A:N/A] Wound Margin: [15:Indistinct, nonvisible] [16:N/A] [N/A:N/A] Granulation Amount: [15:Medium (34-66%)] [16:Medium (34-66%)] [N/A:N/A] Granulation Quality: [15:Pink, Pale] [16:Pink, Hyper-granulation] [N/A:N/A] Necrotic Amount: [15:Medium (34-66%)] [16:Medium (34-66%)] [N/A:N/A] Exposed Structures: [15:Fat Layer (Subcutaneous Tissue): Yes Fascia: No Tendon: No Muscle: No Joint: No Bone: No None] [16:Fat Layer (Subcutaneous Tissue): Yes None] [N/A:N/A N/A] Treatment Notes Electronic Signature(s) Signed: 01/02/2022 4:35:54 PM By: [name], [name], RN, CWS, [name], [name] Entered By: [name], [name], RN, CWS, Kim on 01/02/2022 16:00:53 [name] ([id number]) -------------------------------------------------------------------------------- Multi-Disciplinary Care Plan Details Patient Name: GWENLYN, HOTTINGER. Date of Service: 01/02/2022 3:45 PM Medical Record Number: [id number] Patient Account Number: [id number] Date of Birth/Sex: 1946/02/28 (76 y.o. F) Treating RN: Cornell Barman Primary Care Jame Morrell: [name] Other Clinician: Referring Doyt Castellana: [name] Treating Alveta Quintela/Extender: [name] in Treatment: 5 Active Inactive Necrotic Tissue Nursing Diagnoses: Impaired tissue integrity related to necrotic/devitalized tissue Knowledge deficit related to management of necrotic/devitalized tissue Goals: Necrotic/devitalized tissue will be minimized in the wound bed Date Initiated: 11/27/2021 Target Resolution Date: 12/25/2021 Goal Status: Active Patient/caregiver will verbalize understanding of reason and process for debridement of necrotic tissue Date Initiated: 11/27/2021 Target Resolution Date: 12/25/2021 Goal Status: Active Interventions: Assess  patient pain level pre-, during and post procedure and prior to discharge Provide education on necrotic tissue and debridement process Notes: Orientation to the Wound Care Program Nursing Diagnoses: Knowledge deficit related to the wound healing center program Goals: Patient/caregiver will verbalize understanding of the [place] Date Initiated: 11/27/2021 Target Resolution Date: 12/25/2021 Goal Status: Active Interventions: Provide education on orientation to the wound center Notes: Soft Tissue Infection Nursing Diagnoses: Impaired tissue integrity Knowledge deficit related to disease process and management Knowledge deficit related to home infection control: handwashing, handling of soiled dressings, supply storage Potential for infection: soft tissue Goals: Patient will remain free of wound infection Date Initiated: 11/27/2021 Target Resolution Date: 12/25/2021 Goal Status: Active Patient/caregiver will verbalize understanding of or measures to prevent infection and contamination in the home setting Date Initiated: 11/27/2021 Target Resolution Date: 12/25/2021 Goal Status: Active Patient's soft tissue infection will resolve Date Initiated: 11/27/2021 Target Resolution Date: 12/25/2021 Goal Status: Active STEFHANIE, KACHMAR ([id number]) Signs and symptoms of infection will be recognized early to allow for prompt treatment Date Initiated: 11/27/2021 Target Resolution Date: 12/25/2021 Goal Status: Active Interventions: Assess signs and symptoms of infection every visit Provide education on infection Treatment Activities: Culture and sensitivity : 11/27/2021 Education provided on Infection : 11/27/2021 Notes: Venous Leg Ulcer Nursing Diagnoses: Actual venous Insuffiency (use after diagnosis is confirmed) Knowledge deficit related to disease process and management Potential for venous Insuffiency (use before diagnosis confirmed) Goals:  Patient will maintain  optimal edema control Date Initiated: 11/27/2021 Target Resolution Date: 12/25/2021 Goal Status: Active Patient/caregiver will verbalize understanding of disease process and disease management Date Initiated: 11/27/2021 Target Resolution Date: 12/25/2021 Goal Status: Active Verify adequate tissue perfusion prior to therapeutic compression application Date Initiated: 11/27/2021 Target Resolution Date: 12/25/2021 Goal Status: Active Interventions: Provide education on venous insufficiency Treatment Activities: Therapeutic compression applied : 11/27/2021 Notes: Wound/Skin Impairment Nursing Diagnoses: Impaired tissue integrity Knowledge deficit related to smoking impact on wound healing Knowledge deficit related to ulceration/compromised skin integrity Goals: Patient/caregiver will verbalize understanding of skin care regimen Date Initiated: 11/27/2021 Target Resolution Date: 12/25/2021 Goal Status: Active Ulcer/skin breakdown will have a volume reduction of 30% by week 4 Date Initiated: 11/27/2021 Target Resolution Date: 12/25/2021 Goal Status: Active Ulcer/skin breakdown will have a volume reduction of 50% by week 8 Date Initiated: 11/27/2021 Target Resolution Date: 01/22/2022 Goal Status: Active Ulcer/skin breakdown will have a volume reduction of 80% by week 12 Date Initiated: 11/27/2021 Target Resolution Date: 02/22/2022 Goal Status: Active Ulcer/skin breakdown will heal within 14 weeks Date Initiated: 11/27/2021 Target Resolution Date: 03/08/2022 Goal Status: Active Interventions: Assess ulceration(s) every visit Provide education on ulcer and skin care STAVROULA, ROHDE ([id number]) Treatment Activities: Skin care regimen initiated : 11/27/2021 Topical wound management initiated : 11/27/2021 Notes: Electronic Signature(s) Signed: 01/02/2022 4:35:54 PM By: [name], [name], RN, CWS, [name], [name] Entered By: [name], [name], RN, CWS, Kim on 01/02/2022 16:00:37 [name]  ([id number]) -------------------------------------------------------------------------------- Pain Assessment Details Patient Name: EDIT, RICCIARDELLI. Date of Service: 01/02/2022 3:45 PM Medical Record Number: [id number] Patient Account Number: [id number] Date of Birth/Sex: [birth year] (76 y.o. F) Treating RN: Cornell Barman Primary Care Tifanie Gardiner: [name] Other Clinician: Referring Tad Fancher: [name] Treating Graham Hyun/Extender: [name] in Treatment: 5 Active Problems Location of Pain Severity and Description of Pain Patient Has Paino No Site Locations Pain Management and Medication Current Pain Management: Electronic Signature(s) Signed: 01/02/2022 4:35:54 PM By: [name], [name], RN, CWS, [name], [name] Entered By: [name], [name], RN, CWS, Kim on 01/02/2022 15:40:19 [name] ([id number]) -------------------------------------------------------------------------------- Patient/Caregiver Education Details Patient Name: LATISE, DILLEY. Date of Service: 01/02/2022 3:45 PM Medical Record Number: [id number] Patient Account Number: [id number] Date of Birth/Gender: 1946/05/04 (76 y.o. F) Treating RN: Cornell Barman Primary Care Physician: [name] Other Clinician: Referring Physician: [name] Treating Physician/Extender: [name] in Treatment: 5 Education Assessment Education Provided To: Patient Education Topics Provided Infection: Handouts: Infection Prevention and Management Methods: Demonstration, Explain/Verbal Responses: State content correctly Wound/Skin Impairment: Handouts: Caring for Your Ulcer Methods: Demonstration, Explain/Verbal Responses: State content correctly Electronic Signature(s) Signed: 01/02/2022 4:35:54 PM By: [name], [name], RN, CWS, [name], [name] Entered By: [name], [name], RN, CWS, Kim on 01/02/2022 16:20:56 [name] ([id number]) -------------------------------------------------------------------------------- Wound  Assessment Details Patient Name: BRIELLA, HOBDAY. Date of Service: 01/02/2022 3:45 PM Medical Record Number: [id number] Patient Account Number: [id number] Date of Birth/Sex: July 18, 1946 (76 y.o. F) Treating RN: Cornell Barman Primary Care [name] Sakata: [name] Other Clinician: Referring Harper Smoker: [name] Treating Darneshia Demary/Extender: Jeri Cos Weeks in Treatment: 5 Wound Status Wound Number: 15 Primary Lymphedema Etiology: Wound Location: Right, Posterior Lower Leg Secondary Venous Leg Ulcer Wounding Event: Gradually Appeared Etiology: Date Acquired: 10/27/2021 Wound Open Weeks Of Treatment: 5 Status: Clustered Wound: No Notes: wound is boxed in due to odd shape Comorbid Cataracts, Asthma, Sleep Apnea, Deep Vein Thrombosis, History: Hypertension, Peripheral Venous Disease, Osteoarthritis, Received Chemotherapy, Received Radiation Photos Wound Measurements Length: (cm) 6.5 Width: (  cm) 6 Depth: (cm) 0.3 Area: (cm) 30.631 Volume: (cm) 9.189 % Reduction in Area: 67.2% % Reduction in Volume: 1.7% Epithelialization: None Wound Description Classification: Full Thickness Without Exposed Support Structu Wound Margin: Indistinct, nonvisible Exudate Amount: Large Exudate Type: Serosanguineous Exudate Color: red, brown res Foul Odor After Cleansing: No Slough/Fibrino Yes Wound Bed Granulation Amount: Medium (34-66%) Exposed Structure Granulation Quality: Pink, Pale Fascia Exposed: No Necrotic Amount: Medium (34-66%) Fat Layer (Subcutaneous Tissue) Exposed: Yes Necrotic Quality: Adherent Slough Tendon Exposed: No Muscle Exposed: No Joint Exposed: No Bone Exposed: No Treatment Notes Wound #15 (Lower Leg) Wound Laterality: Right, Posterior Cleanser Soap and Water CLAUDIE, BRICKHOUSE ([id number]) Discharge Instruction: Gently cleanse wound with antibacterial soap, rinse and pat dry prior to dressing wounds Peri-Wound Care Topical Triamcinolone Acetonide Cream,  0.1%, 15 (g) tube Discharge Instruction: Apply as directed by Blossom Crume. apply to wound and leg Primary Dressing Xtrasorb Classic Super Absorbent Dressing, 6x9 (in/in) Discharge Instruction: over open areas of the leg that are weeping Secondary Dressing Secured With Compression Wrap Profore Lite LF 3 Multilayer Compression Bandaging System Discharge Instruction: Apply 3 multi-layer wrap as prescribed. Compression Stockings Add-Ons Electronic Signature(s) Signed: 01/02/2022 4:32:58 PM By: Levora Dredge Signed: 01/02/2022 4:35:54 PM By: [name], [name], RN, CWS, [name], [name] Entered By: Levora Dredge on 01/02/2022 16:32:58 CHITARA, CLONCH ([id number]) -------------------------------------------------------------------------------- Wound Assessment Details Patient Name: FAREEDAH, MAHLER. Date of Service: 01/02/2022 3:45 PM Medical Record Number: [id number] Patient Account Number: [id number] Date of Birth/Sex: Sep 15, 1946 (76 y.o. F) Treating RN: Cornell Barman Primary Care Jaquelinne Glendening: [name] Other Clinician: Referring Phuoc Huy: [name] Treating Dayshaun Whobrey/Extender: Jeri Cos Weeks in Treatment: 5 Wound Status Wound Number: 16 Primary Lymphedema Etiology: Wound Location: Right, Medial Lower Leg Secondary Venous Leg Ulcer Wounding Event: Gradually Appeared Etiology: Date Acquired: 10/26/2021 Wound Open Weeks Of Treatment: 1 Status: Clustered Wound: No Comorbid Cataracts, Asthma, Sleep Apnea, Deep Vein Thrombosis, History: Hypertension, Peripheral Venous Disease, Osteoarthritis, Received Chemotherapy, Received Radiation Photos Wound Measurements Length: (cm) 2 Width: (cm) 1.8 Depth: (cm) 0.3 Area: (cm) 2.827 Volume: (cm) 0.848 % Reduction in Area: -71.4% % Reduction in Volume: -413.9% Epithelialization: None Wound Description Classification: Full Thickness Without Exposed Support Structures Exudate Amount: Medium Exudate Type: Serosanguineous Exudate Color:  red, brown Foul Odor After Cleansing: No Slough/Fibrino Yes Wound Bed Granulation Amount: Medium (34-66%) Exposed Structure Granulation Quality: Pink, Hyper-granulation Fat Layer (Subcutaneous Tissue) Exposed: Yes Necrotic Amount: Medium (34-66%) Necrotic Quality: Adherent Slough Treatment Notes Wound #16 (Lower Leg) Wound Laterality: Right, Medial Cleanser Soap and Water Discharge Instruction: Gently cleanse wound with antibacterial soap, rinse and pat dry prior to dressing wounds Peri-Wound Care Topical FEDRA, LANTER. ([id number]) Triamcinolone Acetonide Cream, 0.1%, 15 (g) tube Discharge Instruction: Apply as directed by Rashaun Wichert. apply to wound and leg Primary Dressing Xtrasorb Classic Super Absorbent Dressing, 6x9 (in/in) Discharge Instruction: over open areas of the leg that are weeping Secondary Dressing Secured With Compression Wrap Profore Lite LF 3 Multilayer Compression Bandaging System Discharge Instruction: Apply 3 multi-layer wrap as prescribed. Compression Stockings Add-Ons Electronic Signature(s) Signed: 01/02/2022 4:33:28 PM By: Levora Dredge Signed: 01/02/2022 4:35:54 PM By: [name], [name], RN, CWS, [name], [name] Entered By: Levora Dredge on 01/02/2022 16:33:28 ARIEN, MORINE ([id number]) -------------------------------------------------------------------------------- Vitals Details Patient Name: BAILEY, FAIELLA. Date of Service: 01/02/2022 3:45 PM Medical Record Number: [id number] Patient Account Number: [id number] Date of Birth/Sex: [birth year] (76 y.o. F) Treating RN: Cornell Barman Primary Care Lutricia Widjaja: [name] Other Clinician: Referring Douglas Smolinsky: [name] Treating Jilian West/Extender: Jeri Cos  Weeks in Treatment: 5 Vital Signs Time Taken: 15:39 Temperature (F): 98.9 Height (in): 62 Pulse (bpm): 77 Weight (lbs): 199 Respiratory Rate (breaths/min): 16 Body Mass Index (BMI): 36.4 Blood Pressure (mmHg): 166/89 Reference Range: 80 -  120 mg / dl Notes 174/92 will recheck; 166/89 Electronic Signature(s) Signed: 01/02/2022 4:35:54 PM By: [name], [name], RN, CWS, [name], [name] Entered By: [name], [name], RN, CWS, Kim on 01/02/2022 16:00:11

## 2022-01-03 ENCOUNTER — Encounter: Payer: Self-pay | Admitting: *Deleted

## 2022-01-04 ENCOUNTER — Ambulatory Visit (INDEPENDENT_AMBULATORY_CARE_PROVIDER_SITE_OTHER): Payer: Medicare Other | Admitting: Psychiatry

## 2022-01-04 ENCOUNTER — Encounter: Payer: Self-pay | Admitting: Psychiatry

## 2022-01-04 VITALS — BP 163/76 | HR 82 | Ht 63.0 in | Wt 193.8 lb

## 2022-01-04 DIAGNOSIS — R413 Other amnesia: Secondary | ICD-10-CM

## 2022-01-04 NOTE — Progress Notes (Signed)
[facility]  PATIENT: [name] DOB: 24-Mar-1946  REFERRING CLINICIAN: [name], MD HISTORY FROM: self REASON FOR VISIT: memory loss   HISTORICAL  CHIEF COMPLAINT:  Chief Complaint  Patient presents with   Cognitive impairment    Rm 1 New Pt   MMSE 21    HISTORY OF PRESENT ILLNESS:  The patient presents for evaluation of memory loss which has been present since December 2022.  In October and November 2022 she developed severe sciatica pain and inability to stand which required her to go to the ED. She was given multiple medications including muscle relaxers, steroids, and opiates to help relieve the pain.  Soon after this she started to have word finding difficulty. Then had an episode where she was driving home from [place] and suddenly did not know where she was. It was very dark out at the time. Another driver had to stop and help her find her way back home. She was on her way back home but got lost a second time. It took 3 hours for her to find her way home.  She had a second episode where she took a wrong turn and got lost going home. It was raining at the time. Needed another driver to help her get home again.  During this time she also started losing her train of thought during conversation. Got confused about who she was talking to and thought she was speaking with someone else.  Her PCP took her off of steroids and opiates in November 2022, and she feels her memory has improved significantly. She feels she is now back to baseline. Has started driving at night again without issues.  Notes her father had dementia.  TBI:  No past history of TBI Stroke:  no past history of stroke Seizures:  no past history of seizures Sleep: +History of OSA, uses CPAP consistently at night Mood:  patient has struggled with anxiety and depression off and on throughout her life. She does have some stress about having too many cats (currently has 24 cats)  and finding homes for them  Functional status: Patient lives alone with 24 cats Cooking: Cooks without issues, has not left the stove on. Sometimes will skip lunch Cleaning: no issues Shopping: no issues Driving: got lost driving at night twice, has not had issues since stopping opiates and steroids Bills: forgot to pay bills last fall, now no issues Medications: Occasionally forgets medications Forgetting loved ones names?: no issues Word finding difficulty? yes  OTHER MEDICAL CONDITIONS: sciatica, OSA on CPAP, melanoma (2010), asthma, peripheral neuropathy   REVIEW OF SYSTEMS: Full 14 system review of systems performed and negative with exception of: memory loss  ALLERGIES: Allergies  Allergen Reactions   Sulfa Antibiotics Itching, Swelling and Shortness Of Breath    Facial swelling   Voltaren [Diclofenac Sodium] Shortness Of Breath   Latex Other (See Comments)    Blisters ONLY HAD REACTION TO TAPE  (??? ONLY ADHESIVE ALLERGY)   Tape Other (See Comments)    Caused blisters - must use paper tape - same reaction from NeoPrene   Doxycycline Other (See Comments)    Diaphoresis and dizziness   Other     Neoprene    HOME MEDICATIONS: Outpatient Medications Prior to Visit  Medication Sig Dispense Refill   acetaminophen (TYLENOL) 650 MG CR tablet Take 2 tablets (1,300 mg total) by mouth 2 (two) times daily as needed for pain.     albuterol (VENTOLIN HFA) 108 (90  Base) MCG/ACT inhaler INHALE 2 PUFFS BY MOUTH EVERY 6 HOURS AS NEEDED FOR WHEEZING/SHORTNESS OF BREATH 8.5 each 0   aspirin EC 81 MG tablet Take 81 mg by mouth at bedtime. Melanoma prevention     furosemide (LASIX) 40 MG tablet TAKE 1 TABLET 2 (TWO) TIMES DAILY AS NEEDED FOR FLUID. TAKE SECOND DOSE IF NEEDED FOR LEG SWELLING 60 tablet 1   loratadine (CLARITIN) 10 MG tablet Take 10 mg by mouth daily.     losartan (COZAAR) 50 MG tablet Take 1 tablet (50 mg total) by mouth daily. 90 tablet 1   montelukast (SINGULAIR) 10 MG  tablet TAKE 1 TABLET BY MOUTH EVERY DAY 90 tablet 1   Multiple Vitamin (MULTIVITAMIN WITH MINERALS) TABS tablet Take 1 tablet by mouth daily.     Polyvinyl Alcohol-Povidone (TEARS PLUS OP) Place 1 drop into both eyes daily as needed (dry eyes/ redness/ burning).      potassium chloride SA (KLOR-CON M20) 20 MEQ tablet Take 2 tablets (40 mEq total) by mouth 2 (two) times daily. 20 tablet 0   PRESCRIPTION MEDICATION CPAP     Probiotic Product (PROBIOTIC DAILY PO) Take 1 tablet by mouth daily.      triamcinolone (NASACORT) 55 MCG/ACT AERO nasal inhaler Place 2 sprays into the nose daily. In each nostril     UNABLE TO FIND Take by mouth.     Vitamin D, Ergocalciferol, (DRISDOL) 1.25 MG (50000 UNIT) CAPS capsule TAKE 1 CAPSULE BY MOUTH EVERY 7 DAYS 12 capsule 3   No facility-administered medications prior to visit.    PAST MEDICAL HISTORY: 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/2016   Chronic venous insufficiency 2016   severe reflux with painful varicosities sees [facility]   Clotting disorder ([place])    due to vein ablation    Cognitive dysfunction    Complication of anesthesia    difficulty coming out due to sleep apnea per pt   Difficult intubation    PATIENT DENIES    Diverticulosis    per ct scans 09-2016 and 01-2017   DVT (deep venous thrombosis) ([place]) 2011   small, developed after venous ablation   DVT (deep venous thrombosis) ([place])    Family history of adverse reaction to anesthesia    O2 levels drop upon waking    Hearing loss sensory, bilateral 2013   mod-severe high freq sensorineural ([facility])   Hiatal hernia     09-2016 ct scan HP at cancer center    History of kidney stones 1980s   History of radiation therapy 07/19/11-08/25/2011   RIGHT  AXILLARY REGION/METASTATIC   Hypertension    Melanoma ([place]) 06/29/2010   MALIGNANT MELANOMA R SHOULDER/SUPRASCAPULAR BACK s/p interferon chemo and XRT   Neuromuscular disorder ([facility])    muscle spasms   OSA (obstructive sleep apnea)    on CPAP   Pneumonia    2001   RLS (restless legs syndrome)    Seasonal allergies    Sleep apnea    wears cpap   Tinnitus    Unspecified vitamin D deficiency 03/18/2013   Varicose veins of left lower extremity     PAST SURGICAL HISTORY: Past Surgical History:  Procedure  Laterality Date   ABI  2016   WNL   ANTERIOR CERVICAL DECOMP/DISCECTOMY FUSION N/A 08/22/2015   ANTERIOR CERVICAL DECOMPRESSION FUSION C3/4 - interbody graft and anterior plate; Kristeen Miss, MD   ANTERIOR CERVICAL DECOMP/DISCECTOMY FUSION N/A 07/02/2016   Procedure: Cervical four- five Cervical five- six Cervical six- seven Anterior cervical decompression/diskectomy/fusion;  Surgeon: Kristeen Miss, MD;  Location: [facility];  Service: Neurosurgery;  Laterality: N/A;  C4-5 C5-6 C6-7 Anterior cervical decompression/diskectomy/fusion   BREAST BIOPSY Left 2013   BENIGN   BREAST SURGERY Right 1990   BX    CARDIOVASCULAR STRESS TEST  2010   normal stress test, EF 66%   CATARACT EXTRACTION W/ INTRAOCULAR LENS IMPLANT Left 2019   Dr. Kathrin Penner   COLONOSCOPY  03/2005   benign polyp, int hem, rpt 5 yrs ([place], [place])   COLONOSCOPY WITH PROPOFOL N/A 01/06/2018   diverticulosis, decreased sphincter tone, no f/u needed [name], Kirke Corin, MD)   [place] / EXCISION  2012   RIGHT   DEXA  06/2016   WNL   DILATION AND CURETTAGE OF UTERUS  2010   ENDOVENOUS ABLATION SAPHENOUS VEIN W/ LASER Left 2010   [facility]   ENDOVENOUS ABLATION SAPHENOUS VEIN W/ LASER Left 2016   accessory branch [facility] Kellie Simmering)   ESOPHAGOGASTRODUODENOSCOPY  2006   mod chronic carditis, no H pylori ([place], [place])   INTRAVASCULAR ULTRASOUND/IVUS N/A 08/03/2019   Waynetta Sandy,  MD   KNEE SURGERY  1985   left   LOWER EXTREMITY VENOGRAPHY Left 08/03/2019   Waynetta Sandy, MD   LUMBAR LAMINECTOMY  2001   L4/5   MELANOMA EXCISION  2011   Right shoulder, with sentinel lymph node biopsy   PERIPHERAL VASCULAR INTERVENTION Left 08/03/2019   Stent of left common iliac vein with 14 x 60 mm Vici Donzetta Matters, [place] Dom, MD)   [facility] REMOVAL  12/05/2011   Procedure: REMOVAL PORT-A-CATH;  Surgeon: Rolm Bookbinder, MD;  Location: [facility];  Service: General;  Laterality: N/A;  left port removal   PORTACATH PLACEMENT     left subclavian   UPPER GASTROINTESTINAL ENDOSCOPY      FAMILY HISTORY: Family History  Problem Relation Age of Onset   Cancer Mother 63       uterine   CAD Father 41       MI   Hypertension Father 66   Alzheimer's disease Father 73   Heart attack Father    Diabetes Sister    Cancer Paternal Grandmother        melanoma, possibly   Cancer Cousin        x2, breast   Colon cancer Neg Hx    Colon polyps Neg Hx    Rectal cancer Neg Hx    Stomach cancer Neg Hx     SOCIAL HISTORY: Social History   Socioeconomic History   Marital status: Divorced    Spouse name: Not on file   Number of children: 0   Years of education: Not on file   Highest education level: Some college, no degree  Occupational History   Occupation: retired    Fish farm manager: RETIRED    Comment: Government social research officer   Tobacco Use   Smoking status: Former    Types: Cigarettes    Quit date: 11/05/1965    Years since quitting: 56.2   Smokeless tobacco: Never   Tobacco comments:    socially as a teen  Media planner  Vaping Use: Former  Substance and Sexual Activity   Alcohol use: No    Alcohol/week: 0.0 standard drinks   Drug use: No   Sexual activity: Never  Other Topics Concern   Not on file  Social History Narrative   01/04/22 Lives alone.  Sister and husband live next door, Lives in [place].   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   Edu: some college   Diet: poor - good water, rare fruits/vegetables   Social Determinants of Health   Financial Resource Strain: Low Risk    Difficulty of Paying Living Expenses: Not hard at all  Food Insecurity: No Food Insecurity   Worried About Charity fundraiser in the Last Year: Never true   [place] in the Last Year: Never true  Transportation Needs: No Transportation Needs   Lack of Transportation (Medical): No   Lack of Transportation (Non-Medical): No  Physical Activity: Sufficiently Active   Days of Exercise per Week: 7 days   Minutes of Exercise per Session: 60 min  Stress: No Stress Concern Present   Feeling of Stress : Not at all  Social Connections: Moderately Isolated   Frequency of Communication with Friends and Family: More than three times a week   Frequency of Social Gatherings with Friends and Family: More than three times a week   Attends Religious Services: Never   Marine scientist or Organizations: Yes   Attends Music therapist: More than 4 times per year   Marital Status: Divorced  Human resources officer Violence: Not At Risk   Fear of Current or Ex-Partner: No   Emotionally Abused: No   Physically Abused: No   Sexually Abused: No     PHYSICAL EXAM  GENERAL EXAM/CONSTITUTIONAL: Vitals:  Vitals:   01/04/22 1246  BP: (!) 163/76  Pulse: 82  Weight: 193 lb 12.8 oz (87.9 kg)  Height: 5\' 3"  (1.6 m)   Body mass index is 34.33 kg/m. Wt Readings from Last 3 Encounters:  01/04/22 193 lb 12.8 oz (87.9 kg)  11/20/21 192 lb (87.1 kg)  11/15/21 189 lb 7 oz (85.9 kg)   Patient is in no distress; well developed, nourished and groomed; neck is supple  CARDIOVASCULAR: Examination of peripheral vascular system by observation and palpation is normal  MUSCULOSKELETAL: Gait, strength, tone, movements noted in Neurologic exam below  NEUROLOGIC:  Montreal Cognitive Assessment  01/04/2022  Visuospatial/ Executive (0/5) 3   Naming (0/3) 3  Attention: Read list of digits (0/2) 2  Attention: Read list of letters (0/1) 1  Attention: Serial 7 subtraction starting at 100 (0/3) 0  Language: Repeat phrase (0/2) 2  Language : Fluency (0/1) 1  Abstraction (0/2) 2  Delayed Recall (0/5) 1  Orientation (0/6) 6  Total 21    CRANIAL NERVE:  2nd, 3rd, 4th, 6th - pupils equal and reactive to light, visual fields full to confrontation, extraocular muscles intact, no nystagmus 5th - facial sensation symmetric 7th - facial strength symmetric 8th - hearing intact 9th - palate elevates symmetrically, uvula midline 11th - shoulder shrug symmetric 12th - tongue protrusion midline  MOTOR:  normal bulk and tone, no cogwheeling, full strength in the BUE, BLE  SENSORY:  normal and symmetric to light touch, pinprick, temperature, vibration  COORDINATION:  finger-nose-finger, fine finger movements normal, no tremor  REFLEXES:  deep tendon reflexes present and symmetric  GAIT/STATION:  Stooped posture, decreased stride length, normal arm swing  DIAGNOSTIC DATA (LABS, IMAGING, TESTING) - I reviewed patient records, labs, notes, testing and imaging myself where available.  Lab Results  Component Value Date   WBC 7.1 08/15/2021   HGB 12.5 08/15/2021   HCT 37.9 08/15/2021   MCV 92.2 08/15/2021   PLT 179 08/15/2021      Component Value Date/Time   NA 142 08/15/2021 1414   NA 149 (H) 08/15/2017 1039   NA 143 09/17/2016 1018   K 4.0 08/15/2021 1414   K 4.1 08/15/2017 1039   K 3.5 09/17/2016 1018   CL 105 08/15/2021 1414   CL 107 08/15/2017 1039   CO2 31 08/15/2021 1414   CO2 29 08/15/2017 1039   CO2 28 09/17/2016 1018   GLUCOSE 100 (H) 08/15/2021 1414   GLUCOSE 107 08/15/2017 1039   BUN 16 08/15/2021 1414   BUN 14 08/15/2017 1039   BUN 19.5 09/17/2016 1018   CREATININE 0.59 08/15/2021 1414   CREATININE 1.0 08/15/2017 1039   CREATININE 0.8 09/17/2016 1018   CALCIUM 10.0 08/15/2021 1414   CALCIUM  9.7 08/15/2017 1039   CALCIUM 9.8 09/17/2016 1018   PROT 6.5 08/15/2021 1414   PROT 7.9 08/15/2017 1039   PROT 7.2 09/17/2016 1018   ALBUMIN 3.9 08/15/2021 1414   ALBUMIN 4.0 08/15/2017 1039   ALBUMIN 3.6 09/17/2016 1018   AST 12 (L) 08/15/2021 1414   AST 10 09/17/2016 1018   ALT 16 08/15/2021 1414   ALT 43 08/15/2017 1039   ALT 18 09/17/2016 1018   ALKPHOS 48 08/15/2021 1414   ALKPHOS 48 08/15/2017 1039   ALKPHOS 71 09/17/2016 1018   BILITOT 0.6 08/15/2021 1414   BILITOT 0.39 09/17/2016 1018   GFRNONAA >60 08/15/2021 1414   GFRAA >60 05/05/2020 1115   Lab Results  Component Value Date   CHOL 141 09/25/2017   HDL 48.30 09/25/2017   LDLCALC 76 09/25/2017   LDLDIRECT 87 10/21/2012   TRIG 84.0 09/25/2017   CHOLHDL 3 09/25/2017   No results found for: HGBA1C Lab Results  Component Value Date   [id number] 275 09/27/2021   Lab Results  Component Value Date   TSH 2.05 09/27/2021    MRI brain 08/24/21: 1. No acute intracranial pathology. 2. No evidence of intracranial metastatic disease. 3. Mild chronic white matter microangiopathy and parenchymal volume loss.    ASSESSMENT AND PLAN  76 y.o. year old female with a history of sciatica, OSA on CPAP, melanoma (2010), asthma, and peripheral neuropathy who presents for evaluation of memory loss and confusion. Her MOCA score today is 21/30, which is consistent with mild cognitive impairment. Suspect her acute memory issues were secondary to polypharmacy as she noticed improvement in her memory after stopping these medications. However given concerning events where she got lost and forgot to pay bills, a neuropsychological evaluation will be helpful to better characterize her memory deficits and ensure there is no underlying degenerative process.    1. Memory loss       PLAN: - Will place referral for neuropsychological testing  - Recommend avoid opiates and muscle relaxants if able - Follow up after testing is complete.    Orders Placed This Encounter  Procedures   Ambulatory referral to Neuropsychology    Return after testing.  I spent an average of 47 minutes chart reviewing and counseling the patient, with at least 50% of the time face to face with the patient. General brain health measures discussed, including the importance of regular aerobic exercise. Reviewed safety measures  including driving safety.   Genia Harold, MD 01/04/22 1:49 PM  [facility] [address] [place], [place] [address] [phone]

## 2022-01-04 NOTE — Patient Instructions (Signed)
A person with mild cognitive impairment (MCI) experiences memory problems greater than normally expected with aging, but not serious enough to interfere with daily activities.  The patient with MCI complains of difficulty with memory. Typically, the complaints include trouble remembering the names of people they met recently, trouble remembering the flow of a conversation, and an increased tendency to misplace things, or similar problems. In many cases, the individual will be aware of these difficulties and will compensate with increased reliance on notes and calendars.  ? ?Although there is an increased chances of going on to develop dementia, it is not possible currently to predict with certainty which patients with MCI will or will not go on to develop dementia. There is currently no specific treatment for MCI. People leading sedentary lifestyles are at greater risk for developing dementia. Increased physical activity and brain exercise can help with maintaining brain function. ? ?Tasks to improve attention/working memory ?1. Good sleep hygiene (7-8 hrs of sleep) ?2. Learning a new skill (Painting, Carpentry, Pottery, new language, Knitting). ?3.Cognitive exercises (keep a daily journal, Puzzles) ?4. Physical exercise and training  (30 min/day X 4 days week) ?5. Being on Antidepressant if needed ?6.Yoga, Meditation, Tai Chi ?7. Decrease alcohol intake ?8.Have a clear schedule and structure in daily routine ? ?

## 2022-01-05 ENCOUNTER — Ambulatory Visit: Payer: Medicare Other | Admitting: Physician Assistant

## 2022-01-08 ENCOUNTER — Telehealth: Payer: Self-pay | Admitting: Psychiatry

## 2022-01-08 ENCOUNTER — Encounter: Payer: Medicare Other | Attending: Physician Assistant | Admitting: Physician Assistant

## 2022-01-08 ENCOUNTER — Other Ambulatory Visit: Payer: Self-pay

## 2022-01-08 DIAGNOSIS — I890 Lymphedema, not elsewhere classified: Secondary | ICD-10-CM | POA: Insufficient documentation

## 2022-01-08 DIAGNOSIS — L97811 Non-pressure chronic ulcer of other part of right lower leg limited to breakdown of skin: Secondary | ICD-10-CM | POA: Insufficient documentation

## 2022-01-08 DIAGNOSIS — G4733 Obstructive sleep apnea (adult) (pediatric): Secondary | ICD-10-CM | POA: Diagnosis not present

## 2022-01-08 DIAGNOSIS — L97212 Non-pressure chronic ulcer of right calf with fat layer exposed: Secondary | ICD-10-CM | POA: Diagnosis not present

## 2022-01-08 DIAGNOSIS — I87331 Chronic venous hypertension (idiopathic) with ulcer and inflammation of right lower extremity: Secondary | ICD-10-CM | POA: Insufficient documentation

## 2022-01-08 DIAGNOSIS — I10 Essential (primary) hypertension: Secondary | ICD-10-CM | POA: Insufficient documentation

## 2022-01-08 DIAGNOSIS — J45909 Unspecified asthma, uncomplicated: Secondary | ICD-10-CM | POA: Insufficient documentation

## 2022-01-08 DIAGNOSIS — I872 Venous insufficiency (chronic) (peripheral): Secondary | ICD-10-CM | POA: Insufficient documentation

## 2022-01-08 DIAGNOSIS — L97812 Non-pressure chronic ulcer of other part of right lower leg with fat layer exposed: Secondary | ICD-10-CM | POA: Diagnosis not present

## 2022-01-08 DIAGNOSIS — Z85820 Personal history of malignant melanoma of skin: Secondary | ICD-10-CM | POA: Diagnosis not present

## 2022-01-08 NOTE — Telephone Encounter (Signed)
Referral sent to [facility] [phone]. °

## 2022-01-08 NOTE — Progress Notes (Signed)
KAEGAN, STIGLER ([id number]) Visit Report for 01/08/2022 Arrival Information Details Patient Name: [name], [name]. Date of Service: 01/08/2022 11:15 AM Medical Record Number: [id number] Patient Account Number: [id number] Date of Birth/Sex: December 13, 1945 (76 y.o. F) Treating RN: Levora Dredge Primary Care Artemio Dobie: [name] Other Clinician: Referring Shella Lahman: [name] Treating Delonda Coley/Extender: [name] in Treatment: 6 Visit Information History Since Last Visit Added or deleted any medications: No Patient Arrived: Ambulatory Any new allergies or adverse reactions: No Arrival Time: 11:08 Had a fall or experienced change in No Accompanied By: self activities of daily living that may affect Transfer Assistance: None risk of falls: Patient Identification Verified: Yes Hospitalized since last visit: No Secondary Verification Process Completed: Yes Has Dressing in Place as Prescribed: Yes Patient Requires Transmission-Based No Has Compression in Place as Prescribed: Yes Precautions: Pain Present Now: No Patient Has Alerts: Yes Patient Alerts: Patient on Blood Thinner '81mg'$  aspirin Electronic Signature(s) Signed: 01/08/2022 3:20:36 PM By: Levora Dredge Entered By: Levora Dredge on 01/08/2022 11:09:03 [name] ([id number]) -------------------------------------------------------------------------------- Clinic Level of Care Assessment Details Patient Name: [name], [name]. Date of Service: 01/08/2022 11:15 AM Medical Record Number: [id number] Patient Account Number: [id number] Date of Birth/Sex: Dec 27, 1945 (76 y.o. F) Treating RN: Levora Dredge Primary Care Hawkins Seaman: [name] Other Clinician: Referring Aleiah Mohammed: [name] Treating Alicianna Litchford/Extender: [name] in Treatment: 6 Clinic Level of Care Assessment Items TOOL 1 Quantity Score '[]'$  - Use when EandM and Procedure is performed on INITIAL visit 0 ASSESSMENTS - Nursing  Assessment / Reassessment '[]'$  - General Physical Exam (combine w/ comprehensive assessment (listed just below) when performed on new 0 pt. evals) '[]'$  - 0 Comprehensive Assessment (HX, ROS, Risk Assessments, Wounds Hx, etc.) ASSESSMENTS - Wound and Skin Assessment / Reassessment '[]'$  - Dermatologic / Skin Assessment (not related to wound area) 0 ASSESSMENTS - Ostomy and/or Continence Assessment and Care '[]'$  - Incontinence Assessment and Management 0 '[]'$  - 0 Ostomy Care Assessment and Management (repouching, etc.) PROCESS - Coordination of Care '[]'$  - Simple Patient / Family Education for ongoing care 0 '[]'$  - 0 Complex (extensive) Patient / Family Education for ongoing care '[]'$  - 0 Staff obtains Programmer, systems, Records, Test Results / Process Orders '[]'$  - 0 Staff telephones HHA, Nursing Homes / Clarify orders / etc '[]'$  - 0 Routine Transfer to another Facility (non-emergent condition) '[]'$  - 0 Routine Hospital Admission (non-emergent condition) '[]'$  - 0 New Admissions / Biomedical engineer / Ordering NPWT, Apligraf, etc. '[]'$  - 0 Emergency Hospital Admission (emergent condition) PROCESS - Special Needs '[]'$  - Pediatric / Minor Patient Management 0 '[]'$  - 0 Isolation Patient Management '[]'$  - 0 Hearing / Language / Visual special needs '[]'$  - 0 Assessment of Community assistance (transportation, D/C planning, etc.) '[]'$  - 0 Additional assistance / Altered mentation '[]'$  - 0 Support Surface(s) Assessment (bed, cushion, seat, etc.) INTERVENTIONS - Miscellaneous '[]'$  - External ear exam 0 '[]'$  - 0 Patient Transfer (multiple staff / Civil Service fast streamer / Similar devices) '[]'$  - 0 Simple Staple / Suture removal (25 or less) '[]'$  - 0 Complex Staple / Suture removal (26 or more) '[]'$  - 0 Hypo/Hyperglycemic Management (do not check if billed separately) '[]'$  - 0 Ankle / Brachial Index (ABI) - do not check if billed separately Has the patient been seen at the hospital within the last three years: Yes Total Score: 0 Level Of  Care: ____ [name] ([id number]) Electronic Signature(s) Signed: 01/08/2022 3:20:36 PM By: Levora Dredge Entered By: Levora Dredge on 01/08/2022  11:33:54 LANIYA, FRIEDL ([id number]) -------------------------------------------------------------------------------- Compression Therapy Details Patient Name: [name], [name]. Date of Service: 01/08/2022 11:15 AM Medical Record Number: [id number] Patient Account Number: [id number] Date of Birth/Sex: [birth year] (76 y.o. F) Treating RN: Levora Dredge Primary Care Makynzi Eastland: [name] Other Clinician: Referring Colleene Swarthout: [name] Treating Keghan Mcfarren/Extender: [name] in Treatment: 6 Compression Therapy Performed for Wound Assessment: Wound #15 Right,Posterior Lower Leg Performed By: Clinician Levora Dredge, RN Compression Type: Three Layer Post Procedure Diagnosis Same as Pre-procedure Electronic Signature(s) Signed: 01/08/2022 3:20:36 PM By: Levora Dredge Entered By: Levora Dredge on 01/08/2022 11:33:44 Barbar, [name] ([id number]) -------------------------------------------------------------------------------- Compression Therapy Details Patient Name: [name], [name]. Date of Service: 01/08/2022 11:15 AM Medical Record Number: [id number] Patient Account Number: [id number] Date of Birth/Sex: 1946/08/05 (76 y.o. F) Treating RN: Levora Dredge Primary Care Edra Riccardi: [name] Other Clinician: Referring Justene Jensen: [name] Treating Daralyn Bert/Extender: Jeri Cos Weeks in Treatment: 6 Compression Therapy Performed for Wound Assessment: Wound #16 Right,Medial Lower Leg Performed By: Clinician Levora Dredge, RN Compression Type: Three Layer Post Procedure Diagnosis Same as Pre-procedure Electronic Signature(s) Signed: 01/08/2022 3:20:36 PM By: Levora Dredge Entered By: Levora Dredge on 01/08/2022 11:33:44 Penton, [name]  ([id number]) -------------------------------------------------------------------------------- Encounter Discharge Information Details Patient Name: [name], [name]. Date of Service: 01/08/2022 11:15 AM Medical Record Number: [id number] Patient Account Number: [id number] Date of Birth/Sex: [birth year] (76 y.o. F) Treating RN: Levora Dredge Primary Care Braxdon Gappa: [name] Other Clinician: Referring Balthazar Dooly: [name] Treating Sarahlynn Cisnero/Extender: [name] in Treatment: 6 Encounter Discharge Information Items Discharge Condition: Stable Ambulatory Status: Ambulatory Discharge Destination: Home Transportation: Private Auto Accompanied By: self Schedule Follow-up Appointment: Yes Clinical Summary of Care: Electronic Signature(s) Signed: 01/08/2022 3:20:36 PM By: Levora Dredge Entered By: Levora Dredge on 01/08/2022 11:51:11 Gutterman, [name] ([id number]) -------------------------------------------------------------------------------- Lower Extremity Assessment Details Patient Name: [name], [name]. Date of Service: 01/08/2022 11:15 AM Medical Record Number: [id number] Patient Account Number: [id number] Date of Birth/Sex: 1946-08-03 (76 y.o. F) Treating RN: Levora Dredge Primary Care Tattiana Fakhouri: [name] Other Clinician: Referring Bladyn Tipps: [name] Treating Shareta Fishbaugh/Extender: Jeri Cos Weeks in Treatment: 6 Edema Assessment Assessed: [Left: No] [Right: No] Edema: [Left: Ye] [Right: s] Calf Left: Right: Point of Measurement: 30 cm From Medial Instep 40.7 cm Ankle Left: Right: Point of Measurement: 13 cm From Medial Instep 23 cm Vascular Assessment Pulses: Dorsalis Pedis Palpable: [Right:Yes] Electronic Signature(s) Signed: 01/08/2022 3:20:36 PM By: Levora Dredge Entered By: Levora Dredge on 01/08/2022 11:23:28 DARCEY, CARDY ([id number]) -------------------------------------------------------------------------------- Multi Wound  Chart Details Patient Name: [name], [name]. Date of Service: 01/08/2022 11:15 AM Medical Record Number: [id number] Patient Account Number: [id number] Date of Birth/Sex: [birth year] (76 y.o. F) Treating RN: Levora Dredge Primary Care Anis Degidio: [name] Other Clinician: Referring Gargi Berch: [name] Treating Amaurie Schreckengost/Extender: [name] in Treatment: 6 Vital Signs Height(in): 57 Pulse(bpm): 27 Weight(lbs): 199 Blood Pressure(mmHg): 167/89 Body Mass Index(BMI): 36.4 Temperature(F): 98.3 Respiratory Rate(breaths/min): 18 Photos: [N/A:N/A] Wound Location: Right, Posterior Lower Leg Right, Medial Lower Leg N/A Wounding Event: Gradually Appeared Gradually Appeared N/A Primary Etiology: Lymphedema Lymphedema N/A Secondary Etiology: Venous Leg Ulcer Venous Leg Ulcer N/A Comorbid History: Cataracts, Asthma, Sleep Apnea, Cataracts, Asthma, Sleep Apnea, N/A Deep Vein Thrombosis, Deep Vein Thrombosis, Hypertension, Peripheral Venous Hypertension, Peripheral Venous Disease, Osteoarthritis, Received Disease, Osteoarthritis, Received Chemotherapy, Received Radiation Chemotherapy, Received Radiation Date Acquired: 10/27/2021 10/26/2021 N/A Weeks of Treatment: 6 2 N/A Wound Status: Open Open N/A Wound Recurrence: No No N/A Measurements L x W x D (cm) 5.2x4x0.2 2x1.5x0.3  N/A Area (cm) : 16.336 2.356 N/A Volume (cm) : 3.267 0.707 N/A % Reduction in Area: 82.50% -42.90% N/A % Reduction in Volume: 65.00% -328.50% N/A Classification: Full Thickness Without Exposed Full Thickness Without Exposed N/A Support Structures Support Structures Exudate Amount: Large Medium N/A Exudate Type: Serosanguineous Serosanguineous N/A Exudate Color: red, brown red, brown N/A Wound Margin: Indistinct, nonvisible N/A N/A Granulation Amount: Medium (34-66%) Medium (34-66%) N/A Granulation Quality: Pink, Pale Pink, Hyper-granulation N/A Necrotic Amount: Medium (34-66%) Medium (34-66%)  N/A Exposed Structures: Fat Layer (Subcutaneous Tissue): Fat Layer (Subcutaneous Tissue): N/A Yes Yes Fascia: No Tendon: No Muscle: No Joint: No Bone: No Epithelialization: Small (1-33%) None N/A Treatment Notes Electronic Signature(s) Signed: 01/08/2022 3:20:36 PM By: Carma Lair, [name] ([id number]) Entered By: Levora Dredge on 01/08/2022 11:30:31 [name], [name] ([id number]) -------------------------------------------------------------------------------- Multi-Disciplinary Care Plan Details Patient Name: [name], [name]. Date of Service: 01/08/2022 11:15 AM Medical Record Number: [id number] Patient Account Number: [id number] Date of Birth/Sex: [birth year] (76 y.o. F) Treating RN: Levora Dredge Primary Care Amillia Biffle: [name] Other Clinician: Referring Nanako Stopher: [name] Treating Lavon Bothwell/Extender: [name] in Treatment: 6 Active Inactive Necrotic Tissue Nursing Diagnoses: Impaired tissue integrity related to necrotic/devitalized tissue Knowledge deficit related to management of necrotic/devitalized tissue Goals: Necrotic/devitalized tissue will be minimized in the wound bed Date Initiated: 11/27/2021 Target Resolution Date: 12/25/2021 Goal Status: Active Patient/caregiver will verbalize understanding of reason and process for debridement of necrotic tissue Date Initiated: 11/27/2021 Target Resolution Date: 12/25/2021 Goal Status: Active Interventions: Assess patient pain level pre-, during and post procedure and prior to discharge Provide education on necrotic tissue and debridement process Notes: Orientation to the Wound Care Program Nursing Diagnoses: Knowledge deficit related to the wound healing center program Goals: Patient/caregiver will verbalize understanding of the [place] Date Initiated: 11/27/2021 Target Resolution Date: 12/25/2021 Goal Status: Active Interventions: Provide education on orientation  to the wound center Notes: Soft Tissue Infection Nursing Diagnoses: Impaired tissue integrity Knowledge deficit related to disease process and management Knowledge deficit related to home infection control: handwashing, handling of soiled dressings, supply storage Potential for infection: soft tissue Goals: Patient will remain free of wound infection Date Initiated: 11/27/2021 Target Resolution Date: 12/25/2021 Goal Status: Active Patient/caregiver will verbalize understanding of or measures to prevent infection and contamination in the home setting Date Initiated: 11/27/2021 Target Resolution Date: 12/25/2021 Goal Status: Active Patient's soft tissue infection will resolve Date Initiated: 11/27/2021 Target Resolution Date: 12/25/2021 Goal Status: Active RAKIA, FRAYNE ([id number]) Signs and symptoms of infection will be recognized early to allow for prompt treatment Date Initiated: 11/27/2021 Target Resolution Date: 12/25/2021 Goal Status: Active Interventions: Assess signs and symptoms of infection every visit Provide education on infection Treatment Activities: Culture and sensitivity : 11/27/2021 Education provided on Infection : 01/02/2022 Notes: Venous Leg Ulcer Nursing Diagnoses: Actual venous Insuffiency (use after diagnosis is confirmed) Knowledge deficit related to disease process and management Potential for venous Insuffiency (use before diagnosis confirmed) Goals: Patient will maintain optimal edema control Date Initiated: 11/27/2021 Target Resolution Date: 12/25/2021 Goal Status: Active Patient/caregiver will verbalize understanding of disease process and disease management Date Initiated: 11/27/2021 Target Resolution Date: 12/25/2021 Goal Status: Active Verify adequate tissue perfusion prior to therapeutic compression application Date Initiated: 11/27/2021 Target Resolution Date: 12/25/2021 Goal Status: Active Interventions: Provide education on venous  insufficiency Treatment Activities: Therapeutic compression applied : 11/27/2021 Notes: Wound/Skin Impairment Nursing Diagnoses: Impaired tissue integrity Knowledge deficit related to smoking impact on wound healing Knowledge deficit related to  ulceration/compromised skin integrity Goals: Patient/caregiver will verbalize understanding of skin care regimen Date Initiated: 11/27/2021 Target Resolution Date: 12/25/2021 Goal Status: Active Ulcer/skin breakdown will have a volume reduction of 30% by week 4 Date Initiated: 11/27/2021 Target Resolution Date: 12/25/2021 Goal Status: Active Ulcer/skin breakdown will have a volume reduction of 50% by week 8 Date Initiated: 11/27/2021 Target Resolution Date: 01/22/2022 Goal Status: Active Ulcer/skin breakdown will have a volume reduction of 80% by week 12 Date Initiated: 11/27/2021 Target Resolution Date: 02/22/2022 Goal Status: Active Ulcer/skin breakdown will heal within 14 weeks Date Initiated: 11/27/2021 Target Resolution Date: 03/08/2022 Goal Status: Active Interventions: Assess ulceration(s) every visit Provide education on ulcer and skin care [name], [name] ([id number]) Treatment Activities: Skin care regimen initiated : 11/27/2021 Topical wound management initiated : 11/27/2021 Notes: Electronic Signature(s) Signed: 01/08/2022 3:20:36 PM By: Levora Dredge Entered By: Levora Dredge on 01/08/2022 11:30:18 [name], [name] ([id number]) -------------------------------------------------------------------------------- Pain Assessment Details Patient Name: ALLETTA, MATTOS. Date of Service: 01/08/2022 11:15 AM Medical Record Number: [id number] Patient Account Number: [id number] Date of Birth/Sex: 07/11/1946 (76 y.o. F) Treating RN: Levora Dredge Primary Care Laurella Tull: [name] Other Clinician: Referring Donnivan Villena: [name] Treating Omayra Tulloch/Extender: [name] in Treatment: 6 Active Problems Location of Pain  Severity and Description of Pain Patient Has Paino No Site Locations Rate the pain. Current Pain Level: 0 Pain Management and Medication Current Pain Management: Electronic Signature(s) Signed: 01/08/2022 3:20:36 PM By: Levora Dredge Entered By: Levora Dredge on 01/08/2022 11:13:26 Nabi, [name] ([id number]) -------------------------------------------------------------------------------- Patient/Caregiver Education Details Patient Name: NEVEYAH, GARZON. Date of Service: 01/08/2022 11:15 AM Medical Record Number: [id number] Patient Account Number: [id number] Date of Birth/Gender: 1945/11/24 (76 y.o. F) Treating RN: Levora Dredge Primary Care Physician: [name] Other Clinician: Referring Physician: [name] Treating Physician/Extender: [name] in Treatment: 6 Education Assessment Education Provided To: Patient Education Topics Provided Wound/Skin Impairment: Handouts: Caring for Your Ulcer Methods: Explain/Verbal Responses: State content correctly Electronic Signature(s) Signed: 01/08/2022 3:20:36 PM By: Levora Dredge Entered By: Levora Dredge on 01/08/2022 11:34:26 Cerino, [name] ([id number]) -------------------------------------------------------------------------------- Wound Assessment Details Patient Name: TITILAYO, HAGANS. Date of Service: 01/08/2022 11:15 AM Medical Record Number: [id number] Patient Account Number: [id number] Date of Birth/Sex: 04-07-1946 (76 y.o. F) Treating RN: Levora Dredge Primary Care Geoffrey Hynes: [name] Other Clinician: Referring Kathrynn Backstrom: [name] Treating Carrina Schoenberger/Extender: Jeri Cos Weeks in Treatment: 6 Wound Status Wound Number: 15 Primary Lymphedema Etiology: Wound Location: Right, Posterior Lower Leg Secondary Venous Leg Ulcer Wounding Event: Gradually Appeared Etiology: Date Acquired: 10/27/2021 Wound Open Weeks Of Treatment: 6 Status: Clustered Wound: No Notes: wound is boxed  in due to odd shape Comorbid Cataracts, Asthma, Sleep Apnea, Deep Vein Thrombosis, History: Hypertension, Peripheral Venous Disease, Osteoarthritis, Received Chemotherapy, Received Radiation Photos Wound Measurements Length: (cm) 5.2 Width: (cm) 4 Depth: (cm) 0.2 Area: (cm) 16.336 Volume: (cm) 3.267 % Reduction in Area: 82.5% % Reduction in Volume: 65% Epithelialization: Small (1-33%) Tunneling: No Undermining: No Wound Description Classification: Full Thickness Without Exposed Support Structu Wound Margin: Indistinct, nonvisible Exudate Amount: Large Exudate Type: Serosanguineous Exudate Color: red, brown res Foul Odor After Cleansing: No Slough/Fibrino Yes Wound Bed Granulation Amount: Medium (34-66%) Exposed Structure Granulation Quality: Pink, Pale Fascia Exposed: No Necrotic Amount: Medium (34-66%) Fat Layer (Subcutaneous Tissue) Exposed: Yes Necrotic Quality: Adherent Slough Tendon Exposed: No Muscle Exposed: No Joint Exposed: No Bone Exposed: No Treatment Notes Wound #15 (Lower Leg) Wound Laterality: Right, Posterior Cleanser Soap and [address] MARANDA, MARTE ([id number]) Discharge Instruction: Gently  cleanse wound with antibacterial soap, rinse and pat dry prior to dressing wounds Peri-Wound Care Topical Triamcinolone Acetonide Cream, 0.1%, 15 (g) tube Discharge Instruction: Apply as directed by Lauralynn Loeb. apply to wound and leg Primary Dressing Xtrasorb Classic Super Absorbent Dressing, 6x9 (in/in) Discharge Instruction: over open areas of the leg that are weeping Secondary Dressing Secured With Compression Wrap 3-LAYER WRAP - Profore Lite LF 3 Multilayer Compression [place] Discharge Instruction: Apply 3 multi-layer wrap as prescribed. Compression Stockings Add-Ons Electronic Signature(s) Signed: 01/08/2022 3:20:36 PM By: Levora Dredge Entered By: Levora Dredge on 01/08/2022 11:22:16 CARLTON, SWEANEY  ([id number]) -------------------------------------------------------------------------------- Wound Assessment Details Patient Name: ANNAH, JASKO. Date of Service: 01/08/2022 11:15 AM Medical Record Number: [id number] Patient Account Number: [id number] Date of Birth/Sex: 1946/01/26 (76 y.o. F) Treating RN: Levora Dredge Primary Care Gaudencio Chesnut: [name] Other Clinician: Referring Lincon Sahlin: [name] Treating Taro Hidrogo/Extender: Jeri Cos Weeks in Treatment: 6 Wound Status Wound Number: 16 Primary Lymphedema Etiology: Wound Location: Right, Medial Lower Leg Secondary Venous Leg Ulcer Wounding Event: Gradually Appeared Etiology: Date Acquired: 10/26/2021 Wound Open Weeks Of Treatment: 2 Status: Clustered Wound: No Comorbid Cataracts, Asthma, Sleep Apnea, Deep Vein Thrombosis, History: Hypertension, Peripheral Venous Disease, Osteoarthritis, Received Chemotherapy, Received Radiation Photos Wound Measurements Length: (cm) 2 Width: (cm) 1.5 Depth: (cm) 0.3 Area: (cm) 2.356 Volume: (cm) 0.707 % Reduction in Area: -42.9% % Reduction in Volume: -328.5% Epithelialization: None Tunneling: No Undermining: No Wound Description Classification: Full Thickness Without Exposed Support Structures Exudate Amount: Medium Exudate Type: Serosanguineous Exudate Color: red, brown Foul Odor After Cleansing: No Slough/Fibrino Yes Wound Bed Granulation Amount: Medium (34-66%) Exposed Structure Granulation Quality: Pink, Hyper-granulation Fat Layer (Subcutaneous Tissue) Exposed: Yes Necrotic Amount: Medium (34-66%) Necrotic Quality: Adherent Slough Treatment Notes Wound #16 (Lower Leg) Wound Laterality: Right, Medial Cleanser Soap and Water Discharge Instruction: Gently cleanse wound with antibacterial soap, rinse and pat dry prior to dressing wounds Peri-Wound Care Topical RICKELL, WIEHE. ([id number]) Triamcinolone Acetonide Cream, 0.1%, 15 (g) tube Discharge  Instruction: Apply as directed by Raydin Bielinski. apply to wound and leg Primary Dressing Xtrasorb Classic Super Absorbent Dressing, 6x9 (in/in) Discharge Instruction: over open areas of the leg that are weeping Secondary Dressing Secured With Compression Wrap 3-LAYER WRAP - Profore Lite LF 3 Multilayer Compression [place] Discharge Instruction: Apply 3 multi-layer wrap as prescribed. Compression Stockings Add-Ons Electronic Signature(s) Signed: 01/08/2022 3:20:36 PM By: Levora Dredge Entered By: Levora Dredge on 01/08/2022 11:22:57 KAITHLYN, TEAGLE ([id number]) -------------------------------------------------------------------------------- Vitals Details Patient Name: DENIAH, SAIA. Date of Service: 01/08/2022 11:15 AM Medical Record Number: [id number] Patient Account Number: [id number] Date of Birth/Sex: 06-20-1946 (75 y.o. F) Treating RN: Levora Dredge Primary Care Jhair Witherington: [name] Other Clinician: Referring Stewart Pimenta: [name] Treating Pearley Baranek/Extender: [name] in Treatment: 6 Vital Signs Time Taken: 11:12 Temperature (F): 98.3 Height (in): 62 Pulse (bpm): 81 Weight (lbs): 199 Respiratory Rate (breaths/min): 18 Body Mass Index (BMI): 36.4 Blood Pressure (mmHg): 167/89 Reference Range: 80 - 120 mg / dl Electronic Signature(s) Signed: 01/08/2022 3:20:36 PM By: Levora Dredge Entered By: Levora Dredge on 01/08/2022 11:12:45

## 2022-01-08 NOTE — Progress Notes (Addendum)
[name], [name] ([id number]) Visit Report for 01/08/2022 Chief Complaint Document Details Patient Name: [name], [name]. Date of Service: 01/08/2022 11:15 AM Medical Record Number: [id number] Patient Account Number: [id number] Date of Birth/Sex: 1946/09/05 (76 y.o. F) Treating RN: Levora Dredge Primary Care Provider: [name] Other Clinician: Referring Provider: [name] Treating Provider/Extender: [name] in Treatment: 6 Information Obtained from: Patient Chief Complaint Right LE Ulcer Electronic Signature(s) Signed: 01/08/2022 11:23:13 AM By: Worthy Keeler PA-C Entered By: Worthy Keeler on 01/08/2022 11:23:12 [name] ([id number]) -------------------------------------------------------------------------------- HPI Details Patient Name: [name], [name]. Date of Service: 01/08/2022 11:15 AM Medical Record Number: [id number] Patient Account Number: [id number] Date of Birth/Sex: Dec 01, 1945 (76 y.o. F) Treating RN: Levora Dredge Primary Care Provider: [name] Other Clinician: Referring Provider: [name] Treating Provider/Extender: [name] in Treatment: 6 History of Present Illness HPI Description: Pleasant 76 year old with history of chronic venous insufficiency. No diabetes or peripheral vascular disease. Left ABI 1.29. Questionable history of left lower extremity DVT. She developed a recurrent ulceration on her left lateral calf in December 2015, which she attributes to poor diet and subsequent lower extremity edema. She underwent endovenous laser ablation of her left greater saphenous vein in 2010. She underwent laser ablation of accessory branch of left GSV in April 2016 by Dr. Kellie Simmering at [facility]. She was previously wearing Unna boots, which she tolerated well. Tolerating 2 layer compression and cadexomer iodine. She returns to clinic for follow-up and is without new complaints. She denies any significant pain at this  time. She reports persistent pain with pressure. No claudication or ischemic rest pain. No fever or chills. No drainage. READMISSION 11/13/16; this is a 76 year old woman who is not a diabetic. She is here for a review of a painful area on her left medial lower extremity. I note that she was seen here previously last year for wound I believe to be in the same area. At that time she had undergone previously a left greater saphenous vein ablation by Dr. Kellie Simmering and she had a ablation of the anterior accessory branch of the left greater saphenous vein in March 2016. Seeing that the wound actually closed over. In reviewing the history with her today the ulcer in this area has been recurrent. She describes a biopsy of this area in 2009 that only showed stasis physiology. She also has a history of today malignant melanoma in the right shoulder for which she follows with Dr. 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 Do not see that she has ever had arterial studies in the left leg. She tells me she has a follow-up with Dr. Kellie Simmering in roughly 10 days In any case she developed the reopening of this area roughly a month ago. On the background of this she describes rapidly increasing edema which has responded to Lasix 40 mg and metolazone 2.5 mg as well as the patient's lymph massage. She has been told she has both venous insufficiency and lymphedema but she cannot tolerate compression stockings 11/28/16; the patient saw Dr. Kellie Simmering recently. Per the patient he did arterial Dopplers in the office that did not show evidence of arterial insufficiency, per the patient he stated "treat this like an ordinary venous ulcer". She also saw her dermatologist Dr. Ronnald Ramp who felt that this was more of a vascular ulcer. In general things are improving although she  arrives today with increasing bilateral lower  extremity edema with weeping a deeper fluid through the wound on the left medial leg compatible with some degree of lymphedema 12/04/16; the patient's wound is fully epithelialized but I don't think fully healed. We will do another week of depression with Promogran and TCA however I suspect we'll be able to discharge her next week. This is a very unusual-looking wound which was initially a figure-of-eight type wound lying on its side surrounded by petechial like hemorrhage. She has had venous ablation on this side. She apparently does not have an arterial issue per Dr. Kellie Simmering. She saw her dermatologist thought it was "vascular". Patient is definitely going to need ongoing compression and I talked about this with her today she will go to elastic therapy after she leaves here next week 12/11/16; the patient's wound is not completely closed today. She has surrounding scar tissue and in further discussion with the patient it would appear that she had ulcers in this area in 2009 for a prolonged period of time ultimately requiring a punch biopsy of this area that only showed venous insufficiency. I did not previously pickup on this part of the history from the patient. 12/18/16; the patient's wound is completely epithelialized. There is no open area here. She has significant bilateral venous insufficiency with secondary lymphedema to a mild-to-moderate degree she does not have compression stockings.. She did not say anything to me when I was in the room, she told our intake nurse that she was still having pain in this area. This isn't unusual recurrent small open area. She is going to go to elastic therapy to obtain compression stockings. 12/25/16; the patient's wound is fully epithelialized. There is no open area here. The patient describes some continued episodic discomfort in this area medial left calf. However everything looks fine and healed here. She is been to elastic therapy and caught herself 15-20 mmHg  stockings, they apparently were having trouble getting 20-30 mm stockings in her size 01/22/17; this is a patient we discharged from the clinic a month ago. She has a recurrent open wound on her medial left calf. She had 15 mm support stockings. I told her I thought she needed 20-30 mm compression stockings. She tells me that she has been ill with hospitalization secondary to asthma and is been found to have severe hypokalemia likely secondary to a combination of Lasix and metolazone. This morning she noted blistering and leaking fluid on the posterior part of her left leg. She called our intake nurse urgently and we was saw her this afternoon. She has not had any real discomfort here. I don't know that she's been wearing any stockings on this leg for at least 2-3 days. ABIs in this clinic were 1.21 on the right and 1.3 on the left. She is previously seen vascular surgery who does not think that there is a peripheral arterial issue. 01/30/17; Patient arrives with no open wound on the left leg. She has been to elastic therapy and obtained 20-2mhg below knee stockings and she has one on the right leg today. READMISSION 02/19/18; this PMachois a now 76year old patient we've had in this clinic perhaps 3 times before. I had last looked at her from January 07 December 2016 with an area on the medial left leg. We discharged her on 12/25/16 however she had to be readmitted on 01/22/17 with a recurrence. I have in my notes that we discharged her on 20-30 mm stockings although she tells me  she was only wearing support hose because she cannot get stockings on predominantly related to her cervical spine surgery/issues. She has had previous ablations done by vein and vascular in [place] including a great saphenous vein ablation on the left with an anterior accessory branch ablation I think both of these were in 2016. On one of the previous visit she had a biopsy noted 2009 that was negative. She is not felt to  have an arterial issue. She is not a diabetic. She does have a history of obstructive sleep apnea hypertension asthma as well as chronic venous insufficiency and lymphedema. On this occasion she noted 2 dry scaly patch on her left leg. She tried to put lotion on this it didn't really help. There were 2 open areas.the patient has been seeing her primary physician from 02/05/18 through 02/14/18. She had Unna boots applied. The superior wound now on the lateral left leg has closed but she's had one wound that remains open on the lateral left leg. This is not the same spot as we dealt with in 2018. ABIs in this clinic were 1.3 bilaterally [name], [name] ([id number]) 02/26/18; patient has a small wound on the left lateral calf. Dimensions are down. She has chronic venous insufficiency and lymphedema. 03/05/18; small open area on the left lateral calf. Dimensions are down. Tightly adherent necrotic debris over the surface of the wound which was difficult to remove. Also the dressing [over collagen] stuck to the wound surface. This was removed with some difficulty as well. Change the primary dressing to Hydrofera Blue ready 03/12/18; small open area on the left lateral calf. Comes in with tightly adherent surface eschar as well as some adherent Hydrofera Blue. 03/19/18; open area on the left lateral calf. Again adherent surface eschar as well as some adherent Hydrofera Blue nonviable subcutaneous tissue. She complained of pain all week even with the reduction from 4-3 layer compression I put on last week. Also she had an increase in her ankle and calf measurements probably related to the same thing. 03/26/18; open area on the left lateral calf. A very small open area remains here. We used silver alginate starting last week as the Hydrofera Blue seem to stick to the wound bed. In using 4-layer compression 04/02/18; the open area in the left lateral calf at some adherent slough which I removed there is no open area  here. We are able to transition her into her own compression stocking. Truthfully I think this is probably his support hose. However this does not maintain skin integrity will be limited. She cannot put over the toe compression stockings on because of neck problems hand problems etc. She is allergic to the lining layer of juxta lites. We might be forced to use extremitease stocking should this fail READMIT 11/24/2018 Patient is now a 76 year old woman who is not a diabetic. She has been in this clinic on at least 3 previous occasions largely with recurrent wounds on her left leg secondary to chronic venous insufficiency with secondary lymphedema. Her situation is complicated by inability to get stockings on and an allergy to neoprene which is apparently a component and at least juxta lites and other stockings. As a result she really has not been wearing any stockings on her legs. She tells [place] that roughly 2 or 3 weeks ago she started noticing a stinging sensation just above her ankle on the left medial aspect. She has been diagnosed with pseudogout and she wondered whether this was what she was  experiencing. She tried to dress this with something she bought at the store however subsequently it pulled skin off and now she has an open wound that is not improving. She has been using Vaseline gauze with a cover bandage. She saw her primary doctor last week who put an Haematologist on her. ABIs in this clinic was 1.03 on the left 2/12; the area is on the left medial ankle. Odd-looking wound with what looks to be surface epithelialization but a multitude of small petechial openings. This clearly not closed yet. We have been using silver alginate under 3 layer compression with TCA 2/19; the wound area did not look quite as good this week. Necrotic debris over the majority of the wound surface which required debridement. She continues to have a multitude of what looked to be small petechial openings. She  reminds [place] that she had a biopsy on this initially during her first outbreak in 2015 in [place] dermatology. She expresses concern about this being a possible melanoma. She apparently had a nodular melanoma up on her shoulder that was treated with excision, lymph node removal and ultimately radiation. I assured her that this does not look anything like melanoma. Except for the petechial reaction it does look like a venous insufficiency area and she certainly has evidence of this on both sides 2/26; a difficult area on the left medial ankle. The patient clearly has chronic venous hypertension with some degree of lymphedema. The odd thing about the area is the small petechial hemorrhages. I am not really sure how to explain this. This was present last time and this is not a compression injury. We have been using Hydrofera Blue which I changed to last week 3/4; still using Hydrofera Blue. Aggressive debridement today. She does not have known arterial issues. She has seen Dr. Kellie Simmering at [facility] vein and vascular and and has an ablation on the left. [Anterior accessory branch of the greater saphenous]. From what I remember they did not feel she had an arterial issue. The patient has had this area biopsied in 2009 at [facility] dermat[facility] and by her recollection they said this was "stasis". She is also follow-up with dermatology locally who thought that this was more of a vascular issue 3/11; using Hydrofera Blue. Aggressive debridement today. She does not have an arterial issue. We are using 3 layer compression although we may need to go to 4. The patient has been in for multiple changes to her wrap since I last saw her a week ago. She says that the area was leaking. I do not have too much more information on what was found 01/19/19 on evaluation today patient was actually being seen for a nurse visit when unfortunately she had the area on her left lateral lower extremity as well as weeping from the  right lower extremity that became apparent. Therefore we did end up actually seeing her for a full visit with myself. She is having some pain at this site as well but fortunately nothing too significant at this point. No fevers, chills, nausea, or vomiting noted at this time. 3/18-Patient is back to the clinic with the left leg venous leg ulcer, the ulcer is larger in size, has a surface that is densely adherent with fibrinous tissue, the Hydrofera Blue was used but is densely adherent and there was difficulty in removing it. The right lower extremity was also wrapped for weeping edema. Patient has a new area over the left lateral foot above the malleolus that is  small and appears to have no debris with intact surrounding skin. Patient is on increased dose of Lasix also as a means to edema management 3/25; the patient has a nonhealing venous ulcer on the medial left leg and last week developed a smaller area on the lateral left calf. We have been using Hydrofera Blue with a contact layer. 4/1; no major change in these wounds areas. Left medial and more recently left lateral calf. I tried Iodoflex last week to aid in debridement she did not tolerate this. She stated her pain was terrible all week. She took the top layer of the 4 layer compression off. 4/8; the patient actually looks somewhat better in terms of her more prominent left lateral calf wound. There is some healthy looking tissue here. She is still complaining of a lot of discomfort. 4/15; patient in a lot of pain secondary to sciatica. She is on a prednisone taper prescribed by her primary physician. She has the 2 areas one on the left medial and more recently a smaller area on the left lateral calf. Both of these just above the malleoli 4/22; her back pain is better but she still states she is very uncomfortable and now feels she is intolerant to the [employer]. No real change in the wounds we have been using Sorbact. She has been  previously intolerant to Iodoflex. There is not a lot of option about what we can use to debride this wound under compression that she no doubt needs. sHe states Ultram no longer works for her pain 4/29; no major change in the wounds slightly increased depth. Surface on the original medial wound perhaps somewhat improved however the more recent area on the lateral left ankle is 100% covered in very adherent debris we have been using Sorbact. She tolerates 4 layer compression well and her edema control is a lot better. She has not had to come in for a nurse check 5/6; no major change in the condition of the wounds. She did consent to debridement today which was done with some difficulty. Continuing Sorbact. She did not tolerate Iodoflex. She was in for a check of her compression the day after we wrapped her last week this was adjusted but nothing much was found 5/13; no major change in the condition or area of the wounds. I was able to get a fairly aggressive debridement done on the lateral left leg wound. Even using Sorbact under compression. She came back in on Friday to have the wrap changed. She says she felt uncomfortable on the lateral aspect of her ankle. She has a long history of chronic venous insufficiency including previous ablation surgery on this side. 5/20-Patient returns for wounds on left leg with both wounds covered in slough, with the lateral leg wound larger in size, she has been in 3 layer compression and felt more comfortable, she describes pain in ankle, in leg and pins and needles in foot, and is about to try Pamelor for this 6/3; wounds on the left lateral and left medial leg. The area medially which is the most recent of the 2 seems to have had the largest increase in [place], [name]. ([id number]) dimensions. We have been using Sorbac to try and debride the surface. She has been to see orthopedics they apparently did a plain x-ray that was indeterminant. Diagnosed her with  neuropathy and they have ordered an MRI to determine if there is underlying osteomyelitis. This was not high on my thought list but I suppose  it is prudent. We have advised her to make an appointment with vein and vascular in [place]. She has a history of a left greater saphenous and accessory vein ablations I wonder if there is anything else that can be done from a surgical point of view to help in these difficult refractory wounds. We have previously healed this wound on one occasion but it keeps on reopening [medial side] 6/10; deep tissue culture I did last week I think on the left medial wound showed both moderate E. coli and moderate staph aureus [MSSA]. She is going to require antibiotics and I have chosen Augmentin. We have been using Sorbact and we have made better looking wound surface on both sides but certainly no improvement in wound area. She was back in last Friday apparently for a dressing changes the wrap was hurting her outer left ankle. She has not managed to get a hold of vein and vascular in [place]. We are going to have to make her that appointment 6/17; patient is tolerating the Augmentin. She had an MRI that I think was ordered by orthopedic surgeon this did not show osteomyelitis or an abscess did suggest cellulitis. We have been using Sorbact to the lateral and medial ankles. We have been trying to arrange a follow-up appointment with vein and vascular in [place] or did her original ablations. We apparently an area sent the request to vein and vascular in [facility] 6/24; patient has completed the Augmentin. We do not yet have a vein and vascular appointment in [place]. I am not sure what the issue is here we have asked her to call tomorrow. We are using Sorbact. Making some improvements and especially the medial wound. Both surfaces however look better medial and lateral. 7/1; the patient has been in contact with vein and vascular in [place] but has not yet  received an appointment. Using Sorbact we have gradually improve the wound surface with no improvement in surface area. She is approved for Apligraf but the wound surface still is not completely viable. She has not had to come in for a dressing change 7/8; the patient has an appointment with vein and vascular on 7/31 which is a Friday afternoon. She is concerned about getting back here for [place] to dress her wounds. I think it is important to have them goal for her venous reflux/history of ablations etc. to see if anything else can be done. She apparently tested positive for 1 of the blood tests with regards to lupus and saw a rheumatologist. He has raised the issue of vasculitis again. I have had this thought in the past however the evidence seems overwhelming that this is a venous reflux etiology. If the rheumatologist tells me there is clinical and laboratory investigation is positive for lupus I will rethink this. 7/15; the patient's wound surfaces are quite a bit better. The medial area which was her original wound now has no depth although the lateral wound which was the more recent area actually appears larger. Both with viable surfaces which is indeed better. Using Sorbact. I wanted to use Apligraf on her however there is the issue of the vein and vascular appointment on 7/31 at 2:00 in the afternoon which would not allow her to get back to be rewrapped and they would no doubt remove the graft 7/22; the patient's wound surfaces have moderate amount of debris although generally look better. The lateral one is larger with 2 small satellite areas superiorly. We are waiting for her vein and  vascular appointment on 7/31. She has been approved for Apligraf which I would like to use after th 7/29; wound surfaces have improved no debridement is required we have been using Sorbact. She sees vein and vascular on Friday with this so question of whether anything can be done to lessen the likelihood of  recurrence and/or speed the healing of these areas. She is already had previous ablations. She no doubt has severe venous hypertension 8/5-Patient returns at 1 week, she was in [place] for 3 days by her podiatrist, we have been using so backed to the wound, she has increased pain in both the wounds on the left lower leg especially the more distal one on the lateral aspect 8/12-Patient returns at 1 week and she is agreeable to having debridement in both wounds on her left leg today. We have been using Sorbact, and vascular studies were reviewed at last visit 8/19; the patient arrives with her wounds fairly clean and no debridement is required. We have used Sorbact which is really done a nice job in cleaning up these very difficult wound surfaces. The patient saw Dr. Donzetta Matters of vascular surgery on 7/31. He did not feel that there was an arterial component. He felt that her treated greater saphenous vein is adequately addressed and that the small saphenous vein did not appear to be involved significantly. She was also noted to have deep venous reflux which is not treatable. Dr. Donzetta Matters mentioned the possibility of a central obstructive component leading to reflux and he offered her central venography. She wanted to discuss this or think about it. I have urged her to go ahead with this. She has had recurrent difficult wounds in these areas which do heal but after months in the clinic. If there is anything that can be done to reduce the likelihood of this I think it is worth it. 9/2 she is still working towards getting follow-up with Dr. Donzetta Matters to schedule her CT. Things are quite a bit worse venography. I put Apligraf on 2 weeks ago on both wounds on the medial and lateral part of her left lower leg. She arrives in clinic today with 3 superficial additional wounds above the area laterally and one below the wound medially. She describes a lot of discomfort. I think these are probably wrapped injuries. Does not  look like she has cellulitis. 07/20/2019 on evaluation today patient appears to be doing somewhat poorly in regard to her lower extremity ulcers. She in fact showed signs of erythema in fact we may even be dealing with an infection at this time. Unfortunately I am unsure if this is just infection or if indeed there may be some allergic reaction that occurred as a result of the Apligraf application. With that being said that would be unusual but nonetheless not impossible in this patient is one who is unfortunately allergic to quite a bit. Currently we have been using the Sorbact which seems to do as well as anything for her. I do think we may want to obtain a culture today to see if there is anything showing up there that may need to be addressed. 9/16; noted that last week the wounds look worse in 1 week follow-up of the Apligraf. Using Sorbact as of 2 days ago. She arrives with copious amounts of drainage and new skin breakdown on the back of the left calf. The wounds arm more substantial bilaterally. There is a fair amount of swelling in the left calf no overt DVT there  is edema present I think in the left greater than right thigh. She is supposed to go on 9/28 for CT venography. The wounds on the medial and lateral calf are worse and she has new skin breakdown posteriorly at least new for me. This is almost developing into a circumferential wound area The Apligraf was taken off last week which I agree with things are not going in the right direction a culture was done we do not have that back yet. She is on Augmentin that she started 2 days ago 9/23; dressing was changed by her nurses on Monday. In general there is no improvement in the wound areas although the area looks less angry than last week. She did get Augmentin for MSSA cultured on the 14th. She still appears to have too much swelling in the left leg even with 3 layer compression 9/30; the patient underwent her procedure on 9/28 by Dr.  Donzetta Matters at [facility]. She was discovered to have the common iliac vein measuring 12.2 mm but at the level of L4-L5 measured 3 mm. After stenting it measured 10 mm. 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 Rouleaux flow in the common femoral and femoral vein was observed much improved after stenting. We are using silver alginate to the wounds on the medial and lateral ankle on the left. 4 layer compression 10/7; the patient had fluid swelling around her knee and 4 layer compression. At the advice of vein and vascular this was reduced to 3 layer which she is tolerating better. We have been using silver alginate under 3 layer compression since last Friday 10/14; arrives with the areas on the left ankle looking a lot better. Inflammation in the area also a lot better. She came in for a nurse check on 10/9 10/21; continued nice improvement. Slight improvements in surface area of both the medial and lateral wounds on the left. A lot of the satellite lesions in the weeping erythema around these from stasis dermatitis is resolved. We have been using silver alginate [name], [name] ([id number]) 10/28; general improvement in the entire wound areas although not a lot of change in dimensions the wound certainly looks better. There is a lot less in terms of venous inflammation. Continue silver alginate this week however look towards Hydrofera Blue next week 11/4; very adherent debris on the medial wound left wound is not as bad. We have been using silver alginate. Change to [facility] today 11/11; very adherent debris on both wound areas. She went to vein and vascular last week and follow-up they put in [place] boot on this today. He says the [facility] was adherent. Wound is definitely not as good as last week. Especially on the left there the satellite lesions look more prominent 11/18; absolutely no better. erythema on lateral aspect with tenderness. 09/30/2019 on  evaluation today patient appears to actually be doing better. Dr. Dellia Nims did put her on doxycycline last week which I do believe has helped her at this point. Fortunately there is no signs of active infection at this time. No fevers, chills, nausea, vomiting, or diarrhea. I do believe he may want extend the doxycycline for 7 additional days just to ensure everything does completely cleared up the patient is in agreement with that plan. Otherwise she is going require some sharp debridement today 12/2; patient is completing a 2-week course of doxycycline. I gave her this empirically for inflammation as well as infection when I last saw her  2 weeks ago. All of this seems to be better. She is using silver alginate she has the area on the medial aspect of the larger area laterally and the 2 small satellite regions laterally above the major wound. 12/9; the patient's wound on the left medial and left lateral calf look really quite good. We have been using silver alginate. She saw vein and vascular in follow-up on 10/09/2019. She has had a previous left greater saphenous vein ablation by Dr. Oscar La in 2016. More recently she underwent a left common iliac vein stent by Dr. 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 The swelling is improved and certainly the wounds have improved. The patient shows [place] today area on the right medial calf there is almost no wound but leaking lymphedema. She says she start this started 3 or 4 days ago. She did not traumatize it. It is not painful. She does not wear compression on that side 12/16; the patient continues to do well laterally. Medially still requiring debridement. The area on the right calf did not materialize to anything and is not currently open. We wrapped this last time. She has support stockings for that leg although I am not sure they are going to provide adequate compression 12/23; the lateral wound looks stable. Medially still requiring debridement for  tightly adherent fibrinous debris. We've been using silver alginate. Surface area not any different 12/30; neither wound is any better with regards to surface and the area on the left lateral is larger. I been using silver alginate to the left lateral which look quite good last week and Sorbact to the left medial 11/11/2019. Lateral wound area actually looks better and somewhat smaller. Medial still requires a very aggressive debridement today. We have been using Sorbact on both wound areas 1/13; not much better still adherent debris bilaterally. I been using Sorbact. She has severe venous hypertension. Probably some degree of dermal fibrosis distally. I wonder whether tighter compression might help and I am going to try that today. We also need to work on the bioburden 1/20; using Sorbact. She has severe venous hypertension status post stent placement for pelvic vein compression. We applied gentamicin last time to see if we could reduce bioburden I had some discussion with her today about the use of pentoxifylline. This is occasionally used in this setting for wounds with refractory venous insufficiency. However this interacts with Plavix. She tells me that she was put on this after stent placement for 3 months. She will call Dr. Claretha Cooper office to discuss 1/27; we are using gentamicin under Sorbact. 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 She has a stent. Wound medially is measuring smaller this week. Laterally measuring slightly larger although she has some satellite lesions superiorly 2/3; gentamicin under Sorbact under 4-layer compression. 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 She has a stent on Plavix. Her wounds are measuring smaller this week. More substantially laterally where there is a satellite lesion superiorly. 2/10; gentamicin under Sorbac. 4-layer compression. Patient communicated with Dr. Donzetta Matters at vein and vascular in  [place]. He is okay with the patient coming off Plavix I will therefore start her on pentoxifylline for a 1 month trial. In general her wounds look better today. I had some concerns about swelling in the left thigh however she measures 61.5 on the right and 63 on the mid thigh which does not suggest there is any difficulty. The patient is not describing any pain. 2/17; gentamicin under  Sorbac 4-layer compression. She has been on pentoxifylline for 1 week and complains of loose stool. No nausea she is eating and drinking well 2/24; the patient apparently came in 2 days ago for a nurse visit when her wrap fell down. Both areas look a little worse this week macerated medially and satellite lesions laterally. Change to silver alginate today 3/3; wounds are larger today especially medially. She also has more swelling in her foot lower leg and I even noted some swelling in her posterior thigh which is tender. I wonder about the patency of her stent. Fortuitously she sees Dr. Claretha Cooper group on Friday 3/10; Mrs. Guerrier was seen by vein and vascular on 3/5. The patient underwent ultrasound. There was no evidence of thrombosis involving the IVC no evidence of thrombosis involving the right common iliac vein there is no evidence of thrombosis involving the right external iliac vein the left external vein is also patent. The right common iliac vein stent appears patent bilateral common femoral veins are compressible and appear patent. I was concerned about the left common iliac stent however it looks like this is functional. She has some edema in the posterior thigh that was tender she still has that this week. I also note they had trouble finding the pulses in her left foot and booked her for an ABI baseline in 4 weeks. She will follow up in 6 months for repeat IVC duplex. The patient stopped the pentoxifylline because of diarrhea. It does not look like that was being effective in any case. I have advised her  to go back on her aspirin 81 mg tablet, vascular it also suggested this 3/17; comes in today with her wound surfaces a lot better. The excoriations from last week considerably better probably secondary to the TCA. We have been using silver alginate 3/24; comes in today with smaller wounds both medially and laterally. Both required debridement. There are 2 small satellite areas superiorly laterally. She also has a very odd bandlike area in the mid calf almost looking like there was a weakness in the wrap in a localized area. I would write this off as being this however anteriorly she has a small raised ballotable area that is very tender almost reminiscent of an abscess but there was no obvious purulent surface to it. 02/04/20 upon evaluation today patient appears to be doing fairly well in regard to her wounds today. Fortunately there is no signs of active infection at this time. No fevers, chills, nausea, vomiting, or diarrhea. She has been tolerating the dressing changes without complication. Fortunately I feel like she is showing signs of improvement although has been sometime since have seen her. Nonetheless the area of concern that Dr. Dellia Nims had last week where she had possibly an area of the wrap that was we can allow the leg to bulge appears to be doing significantly better today there is no signs of anything worsening. [name], [name] ([id number]) 4/7; the patient's wounds on her medial and lateral left leg continue to contract. We have been using a regular alginate. Last week she developed an area on the right medial lower leg which is probably a venous ulcer as well. 4/14; the wounds on her left medial and lateral lower leg continue to contract. Surface eschar. We have been using regular alginate. The area on the right medial lower leg is closed. We have been putting both legs under 4-layer contraction. The patient went back to see vein and vascular she had arterial studies done  which  were apparently "quite good" per the patient although I have not read their notes I have never felt she had an arterial issue. 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 This is been longstanding and refractory to exercise, leg elevation and longstanding use of compression wraps in our clinic as well as compression stockings on the times we have been able to get these to heal 4/21; we thought she actually might be close this week however she arrives in clinic with a lot of edema in her upper left calf and into her posterior thigh. This is been an intermittent problem here. She says the wrap fell down but it was replaced with a nurse visit on Monday. We are using calcium alginate to the wounds and the wound sizes there not terribly larger than last week but there is a lot more edema 4/28; again wound edges are smaller on both sides. Her edema is better controlled than last time. She is obtained her compression pumps from [employer] although they have not been to her home to set these up. 5/5; left medial and left lateral both look stable. I am not sure the medial is any smaller. We have been using calcium alginate under 4-layer compression. oShe had an area on the right medial. This was eschared today. We have been wrapping this as well. She does not tolerate external compression stockings due to a history of various contact allergies. She has her compression pumps however the representative from the company is coming on her to show her how to use these tomorrow 5/19; patient with severe chronic venous insufficiency secondary to central venous disease. She had a stent placed in her left common iliac vein. She has done better since but still difficult to control wounds. She comes in today with nothing open on the right leg. Her areas on the left medial and left lateral are just about closed. We are using calcium alginate under 4-layer compression.  She is using her external compression pumps at home She only has 15-20 support stockings. States she cannot get anything tighter than that on. 03/30/20-Patient returns at 1 week, the wounds on the left leg are both slightly bigger, the last week she was on 3 layer compression which started to slide down. She is starting to use her lymphedema pumps although she stated on 1 day her right ankle started to swell up and she have to stop that day. Unfortunately the open area seem to oscillate between improving to the point of healing and then flaring up all to do with effectiveness of compression or lack of due to the left leg topography not keeping the compression wraps from rolling down 6/2 patient comes in with a 15/20 mmHg stocking on the right leg. She tells me that she developed a lot of swelling in her ankles she saw orthopedics she was felt to possibly be having a flare of pseudogout versus some other type of arthritis. She was put on steroids for a respiratory issue so that helps with the inflammation. She has not been using the pumps all week. She thinks the left thigh is more swollen than usual and I would agree with that. She has an appointment with Dr. Donzetta Matters 9 days or so from now 6/9; both wounds on the left medial and left lateral are smaller. We have been using calcium alginate under compression. She does not have an open wound on the right leg she is using a stocking and her  compression pumps things are going well. She has an appointment with Dr. Donzetta Matters with regards to her stent in the left common iliac vein 6/16; the wounds on the left medial and left lateral ankle continues to contract. The patient saw Dr. Donzetta Matters and I think he seems satisfied. Ordered follow-up venous reflux studies on both sides in September. Cautioned that she may need thigh-high stockings. She has been using calcium alginate under compression on the left and her own stocking on the right leg. She tells [place] there are no open  wounds on the right 6/23; left lateral is just about closed. Medial required debridement today. We have been using calcium alginate. Extensive discussion about the compression pumps she is only using these on 25 mmHg states she could not take 40 or 30 when the wrap came out to her home to demonstrate these. He said they should not feel tight 6/30; the left lateral wound has a slight amount of eschar. . The area medially is about the same using Hydrofera Blue. 7/7; left lateral wound still has some eschar. I will remove this next week may be closed. The area medially is very small using Hydrofera Blue with improvement. Unfortunately the stockings fell down. Unfortunately the blisters have developed at the edge of where the wrap fell. When this happened she says her legs hurt she did not use her pumps. We are not open Monday for her to come in and change the wraps and she had an appointment yesterday. She also tells me that she is going to have an MRI of her back. She is having pain radiating into her left anterior leg she thinks her from an L5 disc. She saw Dr. Ellene Route of neurosurgery 7/14; the area on the left lateral ankle area is closed. Still a small area medially however it looks better as well. We have been using Hydrofera Blue under 4-layer compression 7/21; left lateral ankle is still closed however her wound on the medial left calf is actually larger. This is probably because Hydrofera Blue got stuck to the wound. She came in for a nurse change on Friday and will do that again this week I was concerned about the amount of swelling that she had last week however she is using her compression pumps twice a day and the swelling seems well controlled 7/28; remaining wound on the left medial lower leg is smaller. We have been using moistened silver collagen under compression she is coming back for a nurse visit. For reasons that were not really clear she was just keeping her legs elevated and not  using her compression pumps. I have asked her to use the compression pumps. She does not have any wounds on the right leg 06/15/20-Patient returns at 2 weeks, her LLE edema is worse and she developed a blister wound that is new and has bigger posterior calf wound on right, we are using Prisma with pad, 4 layer compression. she has been on lasix 40 mg daily 8/18; patient arrives today with things a lot worse than I remember from a few weeks ago. She was seen last week. Noted that her edema was worse and that she now had a left lateral wound as well as deteriorating edema in the medial and posterior part of the lower leg. She says she is using his or her external compression pumps once a day although I wonder about the compliance. 8/25; weeping area on the right medial lower leg. This had actually gotten a small localized area  of her compression stocking wet. oOn the left side there is a large denuded area on the posterior medial lower leg and smaller area on the lateral. This was not the original areas that we dealt with. 9/1 the patient's wound on the left leg include the left lateral and left posterior. Larger superficial wounds weeping. She has very poor edema control. Tender localized edema in the left lower medial ankle/heel probably because of localized wrap issues. She freely admits she is not using the compression pumps. She has been up on her feet a lot. She thinks the hydrofera blue is contributing to the pain she is experiencing.. This is a complaint that I have occasionally heard 9/8; really not much improvement. The patient is still complaining of a lot of pain particularly when she uses compression pumps. I switched her to silver alginate last time because she found the Hydrofera Blue to be irritating. I don't hear much difference in her description with the silver alginate. 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 She has follow-up with Dr. Donzetta Matters I think for ultrasound next month YU, CRAGUN ([id number]) 9/15; quite a bit of improvement today. We have less edema and more epithelialization in both of her wound areas on the left medial and left lateral calf. These are not the site of her original wounds in this area. She says she has been using her compression pumps for 30 minutes twice a day, there pain issues that never quite understood. Silver alginate as the primary dressing 9/22; continued improvement. Both areas medially and laterally still have a small open area there is some eschar. She continues to complain of left medial ankle pain. Swelling in the leg is in much better condition. We have been using silver alginate 9/29; continued improvement. Both areas medially and laterally in the left calf look as though they are close some minor surface eschar but I think this is epithelialized. She comes in today saying she has a ruptured disc at L4-L5 cannot bend over to put on her stockings. 10/6; patient comes in today with no open wounds on either leg. However her edema on the left leg in the upper one third of the lower leg is poorly controlled nonpitting. She says that she could not use the pumps for 2 days and then she has been using the last couple of days. It is not clear to me she has been able to get her stocking on. She has back problems. Mrs. Lukes has severe chronic venous insufficiency with secondary lymphedema. Her venous insufficiency is partially centrally mediated and that she is now post stent in the left common iliac vein. [facility] Thurner's syndrome/physiology]. She follows up with them on 10/15. She wears 20/30 below-knee stockings. She is supposed to use compression pumps at home although I think her compliance about with this is been less than 100%. I have asked her to use these 3 times a day. Finally I think she has lipodermatosclerosis in the left lower leg with an inverted  bottle sign. It is been a major problem controlling the edema in the left leg. The right leg we have had wounds on but not as significant a problem is on the left READMISSION 04/12/2021 Mrs. Wasser is a 76 year old woman we know well in this clinic. She has severe chronic venous insufficiency. 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 I believe she has had bilateral greater saphenous vein ablation in the past as well. She tells me that this wound opened sometime in March. She had a fall and thinks it was initially abrasion. She developed areas she describes as little blisters on the anterior part of her leg and she saw dermatology and was treated for methicillin staph aureus with several rounds of antibiotics. She has been using support stockings on the left leg and says this is the only thing she can get on. Her compression pump use maybe once a day she says if she did not use one she use the other. She comes in today with incredible swelling in the left leg with a wound on the left posterior calf. She has been using Neosporin to this previously a hydrocolloid. 6/15; patient arrives back for 1 week follow-up.Marland Kitchen Apparently her wrap fell down she did not call us to replace this. He has poor edema control. She only uses her compression pumps once a day 6/29; patient presents for 1 week follow-up. She has tolerated the compression wrap well. We have been using Iodoflex under the wrap. She has no issues or complaints today. 7/6; patient presents for 1 week follow-up. She states that the compression wrap rolled down her leg 4 days ago. She has been trying to keep the area covered but has no dressings at home to use. She denies signs of infection. 7/13; patient presents for 1 week follow-up. She states that her compression wrap rolled down her right leg and she called our office and had it placed a few days ago. She has been tolerating the current wrap well. She  states that the Iodoflex is causing a burning sensation. She denies signs of infection. 7/27; patient presents for 1 week follow-up She has tolerated the compression wrap well with Hydrofera Blue underneath. She has now developed a wound to her right lower extremity. She reports having a culture done To this area by her dermatologist that she reports is negative. She currently denies signs of infection. 8/30; patient presents for 1 week follow-up. On the left side she has tolerated the compression wrap well with Hydrofera Blue underneath. She reports some discomfort to her right lower extremity with the 3 layer compression. She currently denies signs of infection. 06/14/2021 upon evaluation today patient's wound actually appears to be doing decently well based on what I am seeing currently. She actually has 2 areas 1 on the left distal/posterior lower leg and the other on the right lower leg. Subsequently the measurements are roughly the same may be slightly smaller but in general have not made any trend towards getting worse which is great news. 06/23/2021 upon evaluation today patient appears to be doing pretty well in regard to her wounds. On the right she is having difficulty with sciatic pain and this subsequently has led to her taking the wrap off over the last week her leg is more swollen she is also been on prednisone for 10 days. With that being said I think we may want to just use a compression sock on the left that way she will not have to worry about the compression wrap being in place that she cannot get off. With that being said I do believe as well that the patient is going to need to continue with the wrapping on the left we will also need to do a little bit of sharp debridement today. 8/24; patient presents for 1 week follow-up. She has no issues  or complaints today. She denies signs of infection. 8/31; patient presents for 1 week follow-up. She has no issues or complaints today. She  has tolerated the compression wrap well on the left lower extremity. She denies signs of infection. 9/7; patient presents for follow-up. She reports that the compression wrap rolled down slightly on her right lower extremity. She reports tenderness to the wound bed. She denies increased warmth or erythema to the surrounding skin. She overall feels well. 9/14; patient presents for follow-up. She has no issues or complaints today. She denies signs of infection. PuraPly is available and patient would like to start this today. 9/21; patient presents for follow-up. She has no issues or complaints today. She tolerated the first skin substitute placement last week under compression. 9/28; patient presents for 1 week follow-up. She has no issues or complaints today. 10/5; patient presents for 1 week follow-up. She reports taking the wrap off yesterday. She was on her feet more yesterday and developed more swelling to her left lower extremity. She denies pain. Overall she is doing well. 10/12; patient presents for 1 week follow-up. She has no issues or complaints today. 10/19; patient presents for follow-up. She has no issues or complaints today. She tolerated the compression wrap well. She states she has compression stockings at home. [name], [name] ([id number]) Readmission: 11/27/2021 upon evaluation today patient appears to be doing poorly in regard to the wounds on her right leg. She also has an area of erythema in the proximal calf area of the right leg which again I do believe is a issue from the standpoint of it being warm and erythematous to touch and also seems to be somewhat localized I really think this may be more of a cellulitis issue than anything else. Fortunately I do not see any signs of active infection Systemically at this time. 12/05/2021 upon evaluation patient's wounds actually appear to be showing some signs of improvement which is not too bad and just 1 week's time. Especially  since we had to get on the right antibiotic which is only been for the past 4 days. Fortunately I do not see any signs of active infection locally nor systemically at this time that has gotten any worse. Overall I think that again with the antibiotic she is significantly improved and currently she is taking Levaquin. 12/11/2021 upon evaluation today patient appears to be doing well with regard to her leg. Between the antibiotics and the triamcinolone I feel like she is doing significantly better which is great news. Overall I am extremely pleased with where we stand. There does not appear to be any signs of active infection locally or systemically at this time. 12/18/2021 upon evaluation today patient appears to be doing well with regard to her wound. I do feel like this is showing signs of some improvement here. Little by little this is clearing up quite nicely. I think it is just a slow process but nonetheless 1 that we are seeing evidence of improvement with regard to. 12/25/2021 upon evaluation today patient appears to be doing well with regard to her wounds she is definitely making some good progress here. Fortunately I do not see any signs of active infection locally or systemically at this time which is great news. No fevers, chills, nausea, vomiting, or diarrhea. 01/02/2022 upon evaluation today patient appears to be doing well with regard to her wound. She has been tolerating the dressing changes without complication. Fortunately I do not see any evidence of active infection  locally or systemically which is great news and overall I am extremely pleased with where we stand today. 01/08/2022 upon evaluation today patient appears to be doing better in regard to her ankle ulcers. She has been tolerating the dressing changes without complication. Fortunately I do not see any signs of infection and both the ankle and leg area is making great progress. Electronic Signature(s) Signed: 01/08/2022 11:50:27  AM By: Worthy Keeler PA-C Entered By: Worthy Keeler on 01/08/2022 11:50:26 [name], [name] ([id number]) -------------------------------------------------------------------------------- Physical Exam Details Patient Name: KAYANN, MAJ. Date of Service: 01/08/2022 11:15 AM Medical Record Number: [id number] Patient Account Number: [id number] Date of Birth/Sex: [birth year] (76 y.o. F) Treating RN: Levora Dredge Primary Care Provider: [name] Other Clinician: Referring Provider: [name] Treating Provider/Extender: [name] in Treatment: 6 Constitutional [name] and well-hydrated in no acute distress. Respiratory normal breathing without difficulty. Psychiatric this patient is able to make decisions and demonstrates good insight into disease process. Alert and Oriented x 3. pleasant and cooperative. Notes Upon inspection patient's wound bed actually showed signs of good granulation and epithelization at this point. Fortunately I do not see any evidence of active infection locally or systemically which is great and overall I am extremely pleased with where we stand today. Electronic Signature(s) Signed: 01/08/2022 11:50:40 AM By: Worthy Keeler PA-C Entered By: Worthy Keeler on 01/08/2022 11:50:40 Mayorga, [name] ([id number]) -------------------------------------------------------------------------------- Physician Orders Details Patient Name: [name], [name]. Date of Service: 01/08/2022 11:15 AM Medical Record Number: [id number] Patient Account Number: [id number] Date of Birth/Sex: 1946-01-25 (76 y.o. F) Treating RN: Levora Dredge Primary Care Provider: [name] Other Clinician: Referring Provider: [name] Treating Provider/Extender: [name] in Treatment: 6 Verbal / Phone Orders: No Diagnosis Coding ICD-10 Coding Code Description I89.0 Lymphedema, not elsewhere classified I87.331 Chronic venous hypertension  (idiopathic) with ulcer and inflammation of right lower extremity L97.811 Non-pressure chronic ulcer of other part of right lower leg limited to breakdown of skin Follow-up Appointments o Return Appointment in 1 week. o Nurse Visit as needed Bathing/ Shower/ Hygiene o May shower with wound dressing protected with water repellent cover or cast protector. Edema Control - Lymphedema / Segmental Compressive Device / Other o Optional: One layer of unna paste to top of compression wrap (to act as an anchor). o Elevate, Exercise Daily and Avoid Standing for Long Periods of Time. o Elevate leg(s) parallel to the floor when sitting. o DO YOUR BEST to sleep in the bed at night. DO NOT sleep in your recliner. Long hours of sitting in a recliner leads to swelling of the legs and/or potential wounds on your backside. Wound Treatment Wound #15 - Lower Leg Wound Laterality: Right, Posterior Cleanser: Soap and Water Discharge Instructions: Gently cleanse wound with antibacterial soap, rinse and pat dry prior to dressing wounds Topical: Triamcinolone Acetonide Cream, 0.1%, 15 (g) tube Discharge Instructions: Apply as directed by provider. apply to wound and leg Primary Dressing: Xtrasorb Classic Super Absorbent Dressing, 6x9 (in/in) Discharge Instructions: over open areas of the leg that are weeping Compression Wrap: 3-LAYER WRAP - Profore Lite LF 3 Multilayer Compression Bandaging System Discharge Instructions: Apply 3 multi-layer wrap as prescribed. Wound #16 - Lower Leg Wound Laterality: Right, Medial Cleanser: Soap and Water Discharge Instructions: Gently cleanse wound with antibacterial soap, rinse and pat dry prior to dressing wounds Topical: Triamcinolone Acetonide Cream, 0.1%, 15 (g) tube Discharge Instructions: Apply as directed by provider. apply to wound and leg Primary Dressing: Lauraine Rinne  Classic Super Absorbent Dressing, 6x9 (in/in) Discharge Instructions: over open areas of the  leg that are weeping Compression Wrap: 3-LAYER WRAP - Profore Lite LF 3 Multilayer Compression Bandaging System Discharge Instructions: Apply 3 multi-layer wrap as prescribed. Electronic Signature(s) Signed: 01/08/2022 3:20:36 PM By: Levora Dredge Signed: 01/08/2022 4:03:57 PM By: Worthy Keeler PA-C Entered By: Levora Dredge on 01/08/2022 11:33:05 [name], RUGGIRELLO ([id number]) EMAN, RYNDERS ([id number]) -------------------------------------------------------------------------------- Problem List Details Patient Name: JENIN, BIRDSALL. Date of Service: 01/08/2022 11:15 AM Medical Record Number: [id number] Patient Account Number: [id number] Date of Birth/Sex: 1946/04/10 (76 y.o. F) Treating RN: Levora Dredge Primary Care Provider: [name] Other Clinician: Referring Provider: [name] Treating Provider/Extender: [name] in Treatment: 6 Active Problems ICD-10 Encounter Code Description Active Date MDM Diagnosis I89.0 Lymphedema, not elsewhere classified 11/27/2021 No Yes I87.331 Chronic venous hypertension (idiopathic) with ulcer and inflammation of 11/27/2021 No Yes right lower extremity L97.811 Non-pressure chronic ulcer of other part of right lower leg limited to 11/27/2021 No Yes breakdown of skin Inactive Problems Resolved Problems Electronic Signature(s) Signed: 01/08/2022 11:23:09 AM By: Worthy Keeler PA-C Entered By: Worthy Keeler on 01/08/2022 11:23:09 [name] ([id number]) -------------------------------------------------------------------------------- Progress Note Details Patient Name: TERUKO, JOSWICK. Date of Service: 01/08/2022 11:15 AM Medical Record Number: [id number] Patient Account Number: [id number] Date of Birth/Sex: 04-16-1946 (76 y.o. F) Treating RN: Levora Dredge Primary Care Provider: [name] Other Clinician: Referring Provider: [name] Treating Provider/Extender: [name] in Treatment:  6 Subjective Chief Complaint Information obtained from Patient Right LE Ulcer History of Present Illness (HPI) Pleasant 76 year old with history of chronic venous insufficiency. No diabetes or peripheral vascular disease. Left ABI 1.29. Questionable history of left lower extremity DVT. She developed a recurrent ulceration on her left lateral calf in December 2015, which she attributes to poor diet and subsequent lower extremity edema. She underwent endovenous laser ablation of her left greater saphenous vein in 2010. She underwent laser ablation of accessory branch of left GSV in April 2016 by Dr. Kellie Simmering at [facility]. She was previously wearing Unna boots, which she tolerated well. Tolerating 2 layer compression and cadexomer iodine. She returns to clinic for follow-up and is without new complaints. She denies any significant pain at this time. She reports persistent pain with pressure. No claudication or ischemic rest pain. No fever or chills. No drainage. READMISSION 11/13/16; this is a 76 year old woman who is not a diabetic. She is here for a review of a painful area on her left medial lower extremity. I note that she was seen here previously last year for wound I believe to be in the same area. At that time she had undergone previously a left greater saphenous vein ablation by Dr. Kellie Simmering and she had a ablation of the anterior accessory branch of the left greater saphenous vein in March 2016. Seeing that the wound actually closed over. In reviewing the history with her today the ulcer in this area has been recurrent. She describes a biopsy of this area in 2009 that only showed stasis physiology. 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 Do not see that she has ever had  arterial studies in the left leg. She tells me she has a follow-up with Dr. Kellie Simmering in  roughly 10 days In any case she developed the reopening of this area roughly a month ago. On the background of this she describes rapidly increasing edema which has responded to Lasix 40 mg and metolazone 2.5 mg as well as the patient's lymph massage. She has been told she has both venous insufficiency and lymphedema but she cannot tolerate compression stockings 11/28/16; the patient saw Dr. Kellie Simmering recently. Per the patient he did arterial Dopplers in the office that did not show evidence of arterial insufficiency, per the patient he stated "treat this like an ordinary venous ulcer". She also saw her dermatologist Dr. Ronnald Ramp who felt that this was more of a vascular ulcer. In general things are improving although she arrives today with increasing bilateral lower extremity edema with weeping a deeper fluid through the wound on the left medial leg compatible with some degree of lymphedema 12/04/16; the patient's wound is fully epithelialized but I don't think fully healed. We will do another week of depression with Promogran and TCA however I suspect we'll be able to discharge her next week. This is a very unusual-looking wound which was initially a figure-of-eight type wound lying on its side surrounded by petechial like hemorrhage. She has had venous ablation on this side. She apparently does not have an arterial issue per Dr. Kellie Simmering. She saw her dermatologist thought it was "vascular". Patient is definitely going to need ongoing compression and I talked about this with her today she will go to elastic therapy after she leaves here next week 12/11/16; the patient's wound is not completely closed today. She has surrounding scar tissue and in further discussion with the patient it would appear that she had ulcers in this area in 2009 for a prolonged period of time ultimately requiring a punch biopsy of this area that only  showed venous insufficiency. I did not previously pickup on this part of the history from the patient. 12/18/16; the patient's wound is completely epithelialized. There is no open area here. She has significant bilateral venous insufficiency with secondary lymphedema to a mild-to-moderate degree she does not have compression stockings.. She did not say anything to me when I was in the room, she told our intake nurse that she was still having pain in this area. This isn't unusual recurrent small open area. She is going to go to elastic therapy to obtain compression stockings. 12/25/16; the patient's wound is fully epithelialized. There is no open area here. The patient describes some continued episodic discomfort in this area medial left calf. However everything looks fine and healed here. She is been to elastic therapy and caught herself 15-20 mmHg stockings, they apparently were having trouble getting 20-30 mm stockings in her size 01/22/17; this is a patient we discharged from the clinic a month ago. She has a recurrent open wound on her medial left calf. She had 15 mm support stockings. I told her I thought she needed 20-30 mm compression stockings. She tells me that she has been ill with hospitalization secondary to asthma and is been found to have severe hypokalemia likely secondary to a combination of Lasix and metolazone. This morning she noted blistering and leaking fluid on the posterior part of her left leg. She called our intake nurse urgently and we was saw her this afternoon. She has not had any real discomfort here. I don't know that she's been wearing any stockings on this leg for at least 2-3 days. ABIs in this clinic were 1.21 on the right and 1.3  on the left. She is previously seen vascular surgery who does not think that there is a peripheral arterial issue. 01/30/17; Patient arrives with no open wound on the left leg. She has been to elastic therapy and obtained 20-69mhg below knee  stockings and she has one on the right leg today. READMISSION 02/19/18; this PRieseris a now 76year old patient we've had in this clinic perhaps 3 times before. I had last looked at her from January 07 December 2016 with an area on the medial left leg. We discharged her on 12/25/16 however she had to be readmitted on 01/22/17 with a recurrence. I have in my notes that we discharged her on 20-30 mm stockings although she tells me she was only wearing support hose because she cannot get stockings on predominantly related to her cervical spine surgery/issues. [place] with an anterior accessory branch ablation I think both of these were in 2016. On one of the previous visit she had a biopsy noted 2009 that was negative. She is not felt to have an arterial issue. She is not a diabetic. She does have a history of obstructive sleep apnea hypertension asthma as well as chronic venous insufficiency and lymphedema. PEMERSYNN, DEATLEY([id number] On this occasion she noted 2 dry scaly patch on her left leg. She tried to put lotion on this it didn't really help. There were 2 open areas.the patient has been seeing her primary physician from 02/05/18 through 02/14/18. She had Unna boots applied. The superior wound now on the lateral left leg has closed but she's had one wound that remains open on the lateral left leg. This is not the same spot as we dealt with in 2018. ABIs in this clinic were 1.3 bilaterally 02/26/18; patient has a small wound on the left lateral calf. Dimensions are down. She has chronic venous insufficiency and lymphedema. 03/05/18; small open area on the left lateral calf. Dimensions are down. Tightly adherent necrotic debris over the surface of the wound which was difficult to remove. Also the dressing [over collagen] stuck to the wound surface. This was removed with some difficulty as  well. Change the primary dressing to Hydrofera Blue ready 03/12/18; small open area on the left lateral calf. Comes in with tightly adherent surface eschar as well as some adherent Hydrofera Blue. 03/19/18; open area on the left lateral calf. Again adherent surface eschar as well as some adherent Hydrofera Blue nonviable subcutaneous tissue. She complained of pain all week even with the reduction from 4-3 layer compression I put on last week. Also she had an increase in her ankle and calf measurements probably related to the same thing. 03/26/18; open area on the left lateral calf. A very small open area remains here. We used silver alginate starting last week as the Hydrofera Blue seem to stick to the wound bed. In using 4-layer compression 04/02/18; the open area in the left lateral calf at some adherent slough which I removed there is no open area here. We are able to transition her into her own compression stocking. Truthfully I think this is probably his support hose. However this does not maintain skin integrity will be limited. She cannot put over the toe compression stockings on because of neck problems hand problems etc. She is allergic to the lining layer of juxta lites. We might be forced to use extremitease stocking should this fail READMIT  11/24/2018 Patient is now a 76 year old woman who is not a diabetic. She has been in this clinic on at least 3 previous occasions largely with recurrent wounds on her left leg secondary to chronic venous insufficiency with secondary lymphedema. Her situation is complicated by inability to get stockings on and an allergy to neoprene which is apparently a component and at least juxta lites and other stockings. As a result she really has not been wearing any stockings on her legs. She tells [place] that roughly 2 or 3 weeks ago she started noticing a stinging sensation just above her ankle on the left medial aspect. She has been diagnosed with pseudogout and she  wondered whether this was what she was experiencing. She tried to dress this with something she bought at the store however subsequently it pulled skin off and now she has an open wound that is not improving. She has been using Vaseline gauze with a cover bandage. She saw her primary doctor last week who put an Haematologist on her. ABIs in this clinic was 1.03 on the left 2/12; the area is on the left medial ankle. Odd-looking wound with what looks to be surface epithelialization but a multitude of small petechial openings. This clearly not closed yet. We have been using silver alginate under 3 layer compression with TCA 2/19; the wound area did not look quite as good this week. Necrotic debris over the majority of the wound surface which required debridement. She continues to have a multitude of what looked to be small petechial openings. She reminds [place] that she had a biopsy on this initially during her first outbreak in [place] [facility]. She expresses concern about this being a possible melanoma. 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 I assured her that this does not look anything like melanoma. Except for the petechial reaction it does look like a venous insufficiency area and she certainly has evidence of this on both sides 2/26; a difficult area on the left medial ankle. The patient clearly has chronic venous hypertension with some degree of lymphedema. The odd thing about the area is the small petechial hemorrhages. I am not really sure how to explain this. This was present last time and this is not a compression injury. We have been using Hydrofera Blue which I changed to last week 3/4; still using Hydrofera Blue. Aggressive debridement today. She does not have known arterial issues. She has seen Dr. Kellie Simmering at [facility] vein and vascular and and has an ablation on the left. [Anterior  accessory branch of the greater saphenous]. From what I remember they did not feel she had an arterial issue. The patient has had this area biopsied in 2009 at [facility] dermatology and by her recollection they said this was "stasis". She is also follow-up with dermatology locally who thought that this was more of a vascular issue 3/11; using Hydrofera Blue. Aggressive debridement today. She does not have an arterial issue. We are using 3 layer compression although we may need to go to 4. The patient has been in for multiple changes to her wrap since I last saw her a week ago. She says that the area was leaking. I do not have too much more information on what was found 01/19/19 on evaluation today patient was actually being seen for a nurse visit when unfortunately she had the area on her left lateral lower extremity as well  as weeping from the right lower extremity that became apparent. Therefore we did end up actually seeing her for a full visit with myself. She is having some pain at this site as well but fortunately nothing too significant at this point. No fevers, chills, nausea, or vomiting noted at this time. 3/18-Patient is back to the clinic with the left leg venous leg ulcer, the ulcer is larger in size, has a surface that is densely adherent with fibrinous tissue, the Hydrofera Blue was used but is densely adherent and there was difficulty in removing it. The right lower extremity was also wrapped for weeping edema. Patient has a new area over the left lateral foot above the malleolus that is small and appears to have no debris with intact surrounding skin. Patient is on increased dose of Lasix also as a means to edema management 3/25; the patient has a nonhealing venous ulcer on the medial left leg and last week developed a smaller area on the lateral left calf. We have been using Hydrofera Blue with a contact layer. 4/1; no major change in these wounds areas. Left medial and more recently  left lateral calf. I tried Iodoflex last week to aid in debridement she did not tolerate this. She stated her pain was terrible all week. She took the top layer of the 4 layer compression off. 4/8; the patient actually looks somewhat better in terms of her more prominent left lateral calf wound. There is some healthy looking tissue here. She is still complaining of a lot of discomfort. 4/15; patient in a lot of pain secondary to sciatica. She is on a prednisone taper prescribed by her primary physician. She has the 2 areas one on the left medial and more recently a smaller area on the left lateral calf. Both of these just above the malleoli 4/22; her back pain is better but she still states she is very uncomfortable and now feels she is intolerant to the [employer]. No real change in the wounds we have been using Sorbact. She has been previously intolerant to Iodoflex. There is not a lot of option about what we can use to debride this wound under compression that she no doubt needs. sHe states Ultram no longer works for her pain 4/29; no major change in the wounds slightly increased depth. Surface on the original medial wound perhaps somewhat improved however the more recent area on the lateral left ankle is 100% covered in very adherent debris we have been using Sorbact. She tolerates 4 layer compression well and her edema control is a lot better. She has not had to come in for a nurse check 5/6; no major change in the condition of the wounds. She did consent to debridement today which was done with some difficulty. Continuing Sorbact. She did not tolerate Iodoflex. She was in for a check of her compression the day after we wrapped her last week this was adjusted but nothing much was found 5/13; no major change in the condition or area of the wounds. I was able to get a fairly aggressive debridement done on the lateral left leg [place], [place] ([id number]) wound. Even using Sorbact under  compression. She came back in on Friday to have the wrap changed. She says she felt uncomfortable on the lateral aspect of her ankle. She has a long history of chronic venous insufficiency including previous ablation surgery on this side. 5/20-Patient returns for wounds on left leg with both wounds covered in slough, with the  lateral leg wound larger in size, she has been in 3 layer compression and felt more comfortable, she describes pain in ankle, in leg and pins and needles in foot, and is about to try Pamelor for this 6/3; wounds on the left lateral and left medial leg. The area medially which is the most recent of the 2 seems to have had the largest increase in dimensions. We have been using Sorbac to try and debride the surface. She has been to see orthopedics they apparently did a plain x-ray that was indeterminant. Diagnosed her with neuropathy and they have ordered an MRI to determine if there is underlying osteomyelitis. This was not high on my thought list but I suppose it is prudent. We have advised her to make an appointment with vein and vascular in [place]. She has a history of a left greater saphenous and accessory vein ablations I wonder if there is anything else that can be done from a surgical point of view to help in these difficult refractory wounds. We have previously healed this wound on one occasion but it keeps on reopening [medial side] 6/10; deep tissue culture I did last week I think on the left medial wound showed both moderate E. coli and moderate staph aureus [MSSA]. She is going to require antibiotics and I have chosen Augmentin. We have been using Sorbact and we have made better looking wound surface on both sides but certainly no improvement in wound area. She was back in last Friday apparently for a dressing changes the wrap was hurting her outer left ankle. She has not managed to get a hold of vein and vascular in [place]. We are going to have to make her that  appointment 6/17; patient is tolerating the Augmentin. She had an MRI that I think was ordered by orthopedic surgeon this did not show osteomyelitis or an abscess did suggest cellulitis. We have been using Sorbact to the lateral and medial ankles. We have been trying to arrange a follow-up appointment with vein and vascular in [place] or did her original ablations. We apparently an area sent the request to vein and vascular in [facility] 6/24; patient has completed the Augmentin. We do not yet have a vein and vascular appointment in [place]. I am not sure what the issue is here we have asked her to call tomorrow. We are using Sorbact. Making some improvements and especially the medial wound. Both surfaces however look better medial and lateral. 7/1; the patient has been in contact with vein and vascular in [place] but has not yet received an appointment. Using Sorbact we have gradually improve the wound surface with no improvement in surface area. She is approved for Apligraf but the wound surface still is not completely viable. She has not had to come in for a dressing change 7/8; the patient has an appointment with vein and vascular on 7/31 which is a Friday afternoon. She is concerned about getting back here for [place] to dress her wounds. I think it is important to have them goal for her venous reflux/history of ablations etc. to see if anything else can be done. She apparently tested positive for 1 of the blood tests with regards to lupus and saw a rheumatologist. He has raised the issue of vasculitis again. I have had this thought in the past however the evidence seems overwhelming that this is a venous reflux etiology. If the rheumatologist tells me there is clinical and laboratory investigation is positive for lupus I will rethink  this. 7/15; the patient's wound surfaces are quite a bit better. The medial area which was her original wound now has no depth although the lateral wound  which was the more recent area actually appears larger. Both with viable surfaces which is indeed better. Using Sorbact. I wanted to use Apligraf on her however there is the issue of the vein and vascular appointment on 7/31 at 2:00 in the afternoon which would not allow her to get back to be rewrapped and they would no doubt remove the graft 7/22; the patient's wound surfaces have moderate amount of debris although generally look better. The lateral one is larger with 2 small satellite areas superiorly. We are waiting for her vein and vascular appointment on 7/31. She has been approved for Apligraf which I would like to use after th 7/29; wound surfaces have improved no debridement is required we have been using Sorbact. She sees vein and vascular on Friday with this so question of whether anything can be done to lessen the likelihood of recurrence and/or speed the healing of these areas. She is already had previous ablations. She no doubt has severe venous hypertension 8/5-Patient returns at 1 week, she was in [place] for 3 days by her podiatrist, we have been using so backed to the wound, she has increased pain in both the wounds on the left lower leg especially the more distal one on the lateral aspect 8/12-Patient returns at 1 week and she is agreeable to having debridement in both wounds on her left leg today. We have been using Sorbact, and vascular studies were reviewed at last visit 8/19; the patient arrives with her wounds fairly clean and no debridement is required. We have used Sorbact which is really done a nice job in cleaning up these very difficult wound surfaces. The patient saw Dr. Donzetta Matters of vascular surgery on 7/31. He did not feel that there was an arterial component. He felt that her treated greater saphenous vein is adequately addressed and that the small saphenous vein did not appear to be involved significantly. She was also noted to have deep venous reflux which is not  treatable. Dr. Donzetta Matters mentioned the possibility of a central obstructive component leading to reflux and he offered her central venography. She wanted to discuss this or think about it. I have urged her to go ahead with this. She has had recurrent difficult wounds in these areas which do heal but after months in the clinic. If there is anything that can be done to reduce the likelihood of this I think it is worth it. 9/2 she is still working towards getting follow-up with Dr. Donzetta Matters to schedule her CT. Things are quite a bit worse venography. I put Apligraf on 2 weeks ago on both wounds on the medial and lateral part of her left lower leg. She arrives in clinic today with 3 superficial additional wounds above the area laterally and one below the wound medially. She describes a lot of discomfort. I think these are probably wrapped injuries. Does not look like she has cellulitis. 07/20/2019 on evaluation today patient appears to be doing somewhat poorly in regard to her lower extremity ulcers. She in fact showed signs of erythema in fact we may even be dealing with an infection at this time. Unfortunately I am unsure if this is just infection or if indeed there may be some allergic reaction that occurred as a result of the Apligraf application. With that being said that would be unusual  but nonetheless not impossible in this patient is one who is unfortunately allergic to quite a bit. Currently we have been using the Sorbact which seems to do as well as anything for her. I do think we may want to obtain a culture today to see if there is anything showing up there that may need to be addressed. 9/16; noted that last week the wounds look worse in 1 week follow-up of the Apligraf. Using Sorbact as of 2 days ago. She arrives with copious amounts of drainage and new skin breakdown on the back of the left calf. The wounds arm more substantial bilaterally. There is a fair amount of swelling in the left calf no  overt DVT there is edema present I think in the left greater than right thigh. She is supposed to go on 9/28 for CT venography. The wounds on the medial and lateral calf are worse and she has new skin breakdown posteriorly at least new for me. This is almost developing into a circumferential wound area The Apligraf was taken off last week which I agree with things are not going in the right direction a culture was done we do not have that back yet. She is on Augmentin that she started 2 days ago 9/23; dressing was changed by her nurses on Monday. In general there is no improvement in the wound areas although the area looks less angry than last week. She did get Augmentin for MSSA cultured on the 14th. She still appears to have too much swelling in the left leg even with 3 layer compression 9/30; the patient underwent her procedure on 9/28 by Dr. Donzetta Matters at vascular and vein specialist. She was discovered to have the common iliac vein measuring 12.2 mm but at the level of L4-L5 measured 3 mm. After stenting it measured 10 mm. 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 Rouleaux flow in the common femoral and femoral vein was observed much improved after stenting. We are using silver alginate to the wounds on the medial and lateral ankle on the left. 4 layer compression 10/7; the patient had fluid swelling around her knee and 4 layer compression. At the advice of vein and vascular this was reduced to 3 layer which she is tolerating better. We have been using silver alginate under 3 layer compression since last Friday MACARENA, LANGSETH ([id number]) 10/14; arrives with the areas on the left ankle looking a lot better. Inflammation in the area also a lot better. She came in for a nurse check on 10/9 10/21; continued nice improvement. Slight improvements in surface area of both the medial and lateral wounds on the left. A lot of the satellite lesions in the weeping erythema around these from  stasis dermatitis is resolved. We have been using silver alginate 10/28; general improvement in the entire wound areas although not a lot of change in dimensions the wound certainly looks better. There is a lot less in terms of venous inflammation. Continue silver alginate this week however look towards Hydrofera Blue next week 11/4; very adherent debris on the medial wound left wound is not as bad. We have been using silver alginate. Change to [facility] today 11/11; very adherent debris on both wound areas. She went to vein and vascular last week and follow-up they put in [place] boot on this today. He says the [facility] was adherent. Wound is definitely not as good as last week. Especially on the left there the satellite lesions look more  prominent 11/18; absolutely no better. erythema on lateral aspect with tenderness. 09/30/2019 on evaluation today patient appears to actually be doing better. Dr. Dellia Nims did put her on doxycycline last week which I do believe has helped her at this point. Fortunately there is no signs of active infection at this time. No fevers, chills, nausea, vomiting, or diarrhea. I do believe he may want extend the doxycycline for 7 additional days just to ensure everything does completely cleared up the patient is in agreement with that plan. Otherwise she is going require some sharp debridement today 12/2; patient is completing a 2-week course of doxycycline. I gave her this empirically for inflammation as well as infection when I last saw her 2 weeks ago. All of this seems to be better. She is using silver alginate she has the area on the medial aspect of the larger area laterally and the 2 small satellite regions laterally above the major wound. 12/9; the patient's wound on the left medial and left lateral calf look really quite good. We have been using silver alginate. She saw vein and vascular in follow-up on 10/09/2019. She has had a previous left greater  saphenous vein ablation by Dr. Oscar La in 2016. More recently she underwent a left common iliac vein stent by Dr. 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 The swelling is improved and certainly the wounds have improved. The patient shows [place] today area on the right medial calf there is almost no wound but leaking lymphedema. She says she start this started 3 or 4 days ago. She did not traumatize it. It is not painful. She does not wear compression on that side 12/16; the patient continues to do well laterally. Medially still requiring debridement. The area on the right calf did not materialize to anything and is not currently open. We wrapped this last time. She has support stockings for that leg although I am not sure they are going to provide adequate compression 12/23; the lateral wound looks stable. Medially still requiring debridement for tightly adherent fibrinous debris. We've been using silver alginate. Surface area not any different 12/30; neither wound is any better with regards to surface and the area on the left lateral is larger. I been using silver alginate to the left lateral which look quite good last week and Sorbact to the left medial 11/11/2019. Lateral wound area actually looks better and somewhat smaller. Medial still requires a very aggressive debridement today. We have been using Sorbact on both wound areas 1/13; not much better still adherent debris bilaterally. I been using Sorbact. She has severe venous hypertension. Probably some degree of dermal fibrosis distally. I wonder whether tighter compression might help and I am going to try that today. We also need to work on the bioburden 1/20; using Sorbact. She has severe venous hypertension status post stent placement for pelvic vein compression. We applied gentamicin last time to see if we could reduce bioburden I had some discussion with her today about the use of pentoxifylline. This is occasionally used in this  setting for wounds with refractory venous insufficiency. However this interacts with Plavix. She tells me that she was put on this after stent placement for 3 months. She will call Dr. Claretha Cooper office to discuss 1/27; we are using gentamicin under Sorbact. 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 She has a stent. Wound medially is measuring smaller this week. Laterally measuring slightly larger although she has some satellite lesions superiorly 2/3; gentamicin under  Sorbact under 4-layer compression. 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 She has a stent on Plavix. Her wounds are measuring smaller this week. More substantially laterally where there is a satellite lesion superiorly. 2/10; gentamicin under Sorbac. 4-layer compression. Patient communicated with Dr. Donzetta Matters at vein and vascular in [place]. He is okay with the patient coming off Plavix I will therefore start her on pentoxifylline for a 1 month trial. In general her wounds look better today. I had some concerns about swelling in the left thigh however she measures 61.5 on the right and 63 on the mid thigh which does not suggest there is any difficulty. The patient is not describing any pain. 2/17; gentamicin under Sorbac 4-layer compression. She has been on pentoxifylline for 1 week and complains of loose stool. No nausea she is eating and drinking well 2/24; the patient apparently came in 2 days ago for a nurse visit when her wrap fell down. Both areas look a little worse this week macerated medially and satellite lesions laterally. Change to silver alginate today 3/3; wounds are larger today especially medially. She also has more swelling in her foot lower leg and I even noted some swelling in her posterior thigh which is tender. I wonder about the patency of her stent. Fortuitously she sees Dr. Claretha Cooper group on Friday 3/10; Mrs. Bettcher was seen by vein and vascular on 3/5. The  patient underwent ultrasound. There was no evidence of thrombosis involving the IVC no evidence of thrombosis involving the right common iliac vein there is no evidence of thrombosis involving the right external iliac vein the left external vein is also patent. The right common iliac vein stent appears patent bilateral common femoral veins are compressible and appear patent. I was concerned about the left common iliac stent however it looks like this is functional. She has some edema in the posterior thigh that was tender she still has that this week. I also note they had trouble finding the pulses in her left foot and booked her for an ABI baseline in 4 weeks. She will follow up in 6 months for repeat IVC duplex. The patient stopped the pentoxifylline because of diarrhea. It does not look like that was being effective in any case. I have advised her to go back on her aspirin 81 mg tablet, vascular it also suggested this 3/17; comes in today with her wound surfaces a lot better. The excoriations from last week considerably better probably secondary to the TCA. We have been using silver alginate 3/24; comes in today with smaller wounds both medially and laterally. Both required debridement. There are 2 small satellite areas superiorly laterally. She also has a very odd bandlike area in the mid calf almost looking like there was a weakness in the wrap in a localized area. I would write this off as being this however anteriorly she has a small raised ballotable area that is very tender almost reminiscent of an abscess but there was no obvious purulent surface to it. [name], [name] ([id number]) 02/04/20 upon evaluation today patient appears to be doing fairly well in regard to her wounds today. Fortunately there is no signs of active infection at this time. No fevers, chills, nausea, vomiting, or diarrhea. She has been tolerating the dressing changes without complication. Fortunately I feel like she is  showing signs of improvement although has been sometime since have seen her. Nonetheless the area of concern that Dr. Dellia Nims had last week where she had possibly an  area of the wrap that was we can allow the leg to bulge appears to be doing significantly better today there is no signs of anything worsening. 4/7; the patient's wounds on her medial and lateral left leg continue to contract. We have been using a regular alginate. Last week she developed an area on the right medial lower leg which is probably a venous ulcer as well. 4/14; the wounds on her left medial and lateral lower leg continue to contract. Surface eschar. We have been using regular alginate. The area on the right medial lower leg is closed. We have been putting both legs under 4-layer contraction. The patient went back to see vein and vascular she had arterial studies done which were apparently "quite good" per the patient although I have not read their notes I have never felt she had an arterial issue. 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 This is been longstanding and refractory to exercise, leg elevation and longstanding use of compression wraps in our clinic as well as compression stockings on the times we have been able to get these to heal 4/21; we thought she actually might be close this week however she arrives in clinic with a lot of edema in her upper left calf and into her posterior thigh. This is been an intermittent problem here. She says the wrap fell down but it was replaced with a nurse visit on Monday. We are using calcium alginate to the wounds and the wound sizes there not terribly larger than last week but there is a lot more edema 4/28; again wound edges are smaller on both sides. Her edema is better controlled than last time. She is obtained her compression pumps from [employer] although they have not been to her home to set these up. 5/5; left medial  and left lateral both look stable. I am not sure the medial is any smaller. We have been using calcium alginate under 4-layer compression. She had an area on the right medial. This was eschared today. We have been wrapping this as well. She does not tolerate external compression stockings due to a history of various contact allergies. She has her compression pumps however the representative from the company is coming on her to show her how to use these tomorrow 5/19; patient with severe chronic venous insufficiency secondary to central venous disease. She had a stent placed in her left common iliac vein. She has done better since but still difficult to control wounds. She comes in today with nothing open on the right leg. Her areas on the left medial and left lateral are just about closed. We are using calcium alginate under 4-layer compression. She is using her external compression pumps at home She only has 15-20 support stockings. States she cannot get anything tighter than that on. 03/30/20-Patient returns at 1 week, the wounds on the left leg are both slightly bigger, the last week she was on 3 layer compression which started to slide down. She is starting to use her lymphedema pumps although she stated on 1 day her right ankle started to swell up and she have to stop that day. Unfortunately the open area seem to oscillate between improving to the point of healing and then flaring up all to do with effectiveness of compression or lack of due to the left leg topography not keeping the compression wraps from rolling down 6/2 patient comes in with a 15/20 mmHg stocking on the right leg. She tells  me that she developed a lot of swelling in her ankles she saw orthopedics she was felt to possibly be having a flare of pseudogout versus some other type of arthritis. She was put on steroids for a respiratory issue so that helps with the inflammation. She has not been using the pumps all week. She thinks  the left thigh is more swollen than usual and I would agree with that. She has an appointment with Dr. Donzetta Matters 9 days or so from now 6/9; both wounds on the left medial and left lateral are smaller. We have been using calcium alginate under compression. She does not have an open wound on the right leg she is using a stocking and her compression pumps things are going well. She has an appointment with Dr. Donzetta Matters with regards to her stent in the left common iliac vein 6/16; the wounds on the left medial and left lateral ankle continues to contract. The patient saw Dr. Donzetta Matters and I think he seems satisfied. Ordered follow-up venous reflux studies on both sides in September. Cautioned that she may need thigh-high stockings. She has been using calcium alginate under compression on the left and her own stocking on the right leg. She tells [place] there are no open wounds on the right 6/23; left lateral is just about closed. Medial required debridement today. We have been using calcium alginate. Extensive discussion about the compression pumps she is only using these on 25 mmHg states she could not take 40 or 30 when the wrap came out to her home to demonstrate these. He said they should not feel tight 6/30; the left lateral wound has a slight amount of eschar. . The area medially is about the same using Hydrofera Blue. 7/7; left lateral wound still has some eschar. I will remove this next week may be closed. The area medially is very small using Hydrofera Blue with improvement. Unfortunately the stockings fell down. Unfortunately the blisters have developed at the edge of where the wrap fell. When this happened she says her legs hurt she did not use her pumps. We are not open Monday for her to come in and change the wraps and she had an appointment yesterday. She also tells me that she is going to have an MRI of her back. She is having pain radiating into her left anterior leg she thinks her from an L5 disc. She saw  Dr. Ellene Route of neurosurgery 7/14; the area on the left lateral ankle area is closed. Still a small area medially however it looks better as well. We have been using Hydrofera Blue under 4-layer compression 7/21; left lateral ankle is still closed however her wound on the medial left calf is actually larger. This is probably because Hydrofera Blue got stuck to the wound. She came in for a nurse change on Friday and will do that again this week I was concerned about the amount of swelling that she had last week however she is using her compression pumps twice a day and the swelling seems well controlled 7/28; remaining wound on the left medial lower leg is smaller. We have been using moistened silver collagen under compression she is coming back for a nurse visit. For reasons that were not really clear she was just keeping her legs elevated and not using her compression pumps. I have asked her to use the compression pumps. She does not have any wounds on the right leg 06/15/20-Patient returns at 2 weeks, her LLE edema is worse and  she developed a blister wound that is new and has bigger posterior calf wound on right, we are using Prisma with pad, 4 layer compression. she has been on lasix 40 mg daily 8/18; patient arrives today with things a lot worse than I remember from a few weeks ago. She was seen last week. Noted that her edema was worse and that she now had a left lateral wound as well as deteriorating edema in the medial and posterior part of the lower leg. She says she is using his or her external compression pumps once a day although I wonder about the compliance. 8/25; weeping area on the right medial lower leg. This had actually gotten a small localized area of her compression stocking wet. On the left side there is a large denuded area on the posterior medial lower leg and smaller area on the lateral. This was not the original areas that we dealt with. 9/1 the patient's wound on the left leg  include the left lateral and left posterior. Larger superficial wounds weeping. She has very poor edema control. Tender localized edema in the left lower medial ankle/heel probably because of localized wrap issues. She freely admits she is not using the compression pumps. She has been up on her feet a lot. She thinks the hydrofera blue is contributing to the pain she is experiencing.. This is a complaint that I have occasionally heard [name], [name] ([id number]) 9/8; really not much improvement. The patient is still complaining of a lot of pain particularly when she uses compression pumps. I switched her to silver alginate last time because she found the Hydrofera Blue to be irritating. I don't hear much difference in her description with the silver alginate. 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 She has follow-up with Dr. Donzetta Matters I think for ultrasound next month 9/15; quite a bit of improvement today. We have less edema and more epithelialization in both of her wound areas on the left medial and left lateral calf. These are not the site of her original wounds in this area. She says she has been using her compression pumps for 30 minutes twice a day, there pain issues that never quite understood. Silver alginate as the primary dressing 9/22; continued improvement. Both areas medially and laterally still have a small open area there is some eschar. She continues to complain of left medial ankle pain. Swelling in the leg is in much better condition. We have been using silver alginate 9/29; continued improvement. Both areas medially and laterally in the left calf look as though they are close some minor surface eschar but I think this is epithelialized. She comes in today saying she has a ruptured disc at L4-L5 cannot bend over to put on her stockings. 10/6; patient comes in today with no open wounds on either leg.  However her edema on the left leg in the upper one third of the lower leg is poorly controlled nonpitting. She says that she could not use the pumps for 2 days and then she has been using the last couple of days. It is not clear to me she has been able to get her stocking on. She has back problems. Mrs. Lentz has severe chronic venous insufficiency with secondary lymphedema. Her venous insufficiency is partially centrally mediated and that she is now post stent in the left common iliac vein. [facility] Thurner's syndrome/physiology]. She follows up with  them on 10/15. She wears 20/30 below-knee stockings. She is supposed to use compression pumps at home although I think her compliance about with this is been less than 100%. I have asked her to use these 3 times a day. Finally I think she has lipodermatosclerosis in the left lower leg with an inverted bottle sign. It is been a major problem controlling the edema in the left leg. The right leg we have had wounds on but not as significant a problem is on the left READMISSION 04/12/2021 Mrs. Romick is a 76 year old woman we know well in this clinic. She has severe chronic venous insufficiency. 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 I believe she has had bilateral greater saphenous vein ablation in the past as well. She tells me that this wound opened sometime in March. She had a fall and thinks it was initially abrasion. She developed areas she describes as little blisters on the anterior part of her leg and she saw dermatology and was treated for methicillin staph aureus with several rounds of antibiotics. She has been using support stockings on the left leg and says this is the only thing she can get on. Her compression pump use maybe once a day she says if she did not use one she use the other. She comes in today with incredible swelling in the left leg with a wound on the left posterior calf. She has been using  Neosporin to this previously a hydrocolloid. 6/15; patient arrives back for 1 week follow-up.Marland Kitchen Apparently her wrap fell down she did not call us to replace this. He has poor edema control. She only uses her compression pumps once a day 6/29; patient presents for 1 week follow-up. She has tolerated the compression wrap well. We have been using Iodoflex under the wrap. She has no issues or complaints today. 7/6; patient presents for 1 week follow-up. She states that the compression wrap rolled down her leg 4 days ago. She has been trying to keep the area covered but has no dressings at home to use. She denies signs of infection. 7/13; patient presents for 1 week follow-up. She states that her compression wrap rolled down her right leg and she called our office and had it placed a few days ago. She has been tolerating the current wrap well. She states that the Iodoflex is causing a burning sensation. She denies signs of infection. 7/27; patient presents for 1 week follow-up She has tolerated the compression wrap well with Hydrofera Blue underneath. She has now developed a wound to her right lower extremity. She reports having a culture done To this area by her dermatologist that she reports is negative. She currently denies signs of infection. 8/30; patient presents for 1 week follow-up. On the left side she has tolerated the compression wrap well with Hydrofera Blue underneath. She reports some discomfort to her right lower extremity with the 3 layer compression. She currently denies signs of infection. 06/14/2021 upon evaluation today patient's wound actually appears to be doing decently well based on what I am seeing currently. She actually has 2 areas 1 on the left distal/posterior lower leg and the other on the right lower leg. Subsequently the measurements are roughly the same may be slightly smaller but in general have not made any trend towards getting worse which is great news. 06/23/2021 upon  evaluation today patient appears to be doing pretty well in regard to her wounds. On the  right she is having difficulty with sciatic pain and this subsequently has led to her taking the wrap off over the last week her leg is more swollen she is also been on prednisone for 10 days. With that being said I think we may want to just use a compression sock on the left that way she will not have to worry about the compression wrap being in place that she cannot get off. With that being said I do believe as well that the patient is going to need to continue with the wrapping on the left we will also need to do a little bit of sharp debridement today. 8/24; patient presents for 1 week follow-up. She has no issues or complaints today. She denies signs of infection. 8/31; patient presents for 1 week follow-up. She has no issues or complaints today. She has tolerated the compression wrap well on the left lower extremity. She denies signs of infection. 9/7; patient presents for follow-up. She reports that the compression wrap rolled down slightly on her right lower extremity. She reports tenderness to the wound bed. She denies increased warmth or erythema to the surrounding skin. She overall feels well. 9/14; patient presents for follow-up. She has no issues or complaints today. She denies signs of infection. PuraPly is available and patient would like to start this today. 9/21; patient presents for follow-up. She has no issues or complaints today. She tolerated the first skin substitute placement last week under compression. 9/28; patient presents for 1 week follow-up. She has no issues or complaints today. 10/5; patient presents for 1 week follow-up. She reports taking the wrap off yesterday. She was on her feet more yesterday and developed more swelling to her left lower extremity. She denies pain. Overall she is doing well. MAI, LONGNECKER ([id number]) 10/12; patient presents for 1 week follow-up. She has  no issues or complaints today. 10/19; patient presents for follow-up. She has no issues or complaints today. She tolerated the compression wrap well. She states she has compression stockings at home. Readmission: 11/27/2021 upon evaluation today patient appears to be doing poorly in regard to the wounds on her right leg. She also has an area of erythema in the proximal calf area of the right leg which again I do believe is a issue from the standpoint of it being warm and erythematous to touch and also seems to be somewhat localized I really think this may be more of a cellulitis issue than anything else. Fortunately I do not see any signs of active infection Systemically at this time. 12/05/2021 upon evaluation patient's wounds actually appear to be showing some signs of improvement which is not too bad and just 1 week's time. Especially since we had to get on the right antibiotic which is only been for the past 4 days. Fortunately I do not see any signs of active infection locally nor systemically at this time that has gotten any worse. Overall I think that again with the antibiotic she is significantly improved and currently she is taking Levaquin. 12/11/2021 upon evaluation today patient appears to be doing well with regard to her leg. Between the antibiotics and the triamcinolone I feel like she is doing significantly better which is great news. Overall I am extremely pleased with where we stand. There does not appear to be any signs of active infection locally or systemically at this time. 12/18/2021 upon evaluation today patient appears to be doing well with regard to her wound. I do feel like  this is showing signs of some improvement here. Little by little this is clearing up quite nicely. I think it is just a slow process but nonetheless 1 that we are seeing evidence of improvement with regard to. 12/25/2021 upon evaluation today patient appears to be doing well with regard to her wounds she is  definitely making some good progress here. Fortunately I do not see any signs of active infection locally or systemically at this time which is great news. No fevers, chills, nausea, vomiting, or diarrhea. 01/02/2022 upon evaluation today patient appears to be doing well with regard to her wound. She has been tolerating the dressing changes without complication. Fortunately I do not see any evidence of active infection locally or systemically which is great news and overall I am extremely pleased with where we stand today. 01/08/2022 upon evaluation today patient appears to be doing better in regard to her ankle ulcers. She has been tolerating the dressing changes without complication. Fortunately I do not see any signs of infection and both the ankle and leg area is making great progress. Objective Constitutional [name] and well-hydrated in no acute distress. Vitals Time Taken: 11:12 AM, Height: 62 in, Weight: 199 lbs, BMI: 36.4, Temperature: 98.3 F, Pulse: 81 bpm, Respiratory Rate: 18 breaths/min, Blood Pressure: 167/89 mmHg. Respiratory normal breathing without difficulty. Psychiatric this patient is able to make decisions and demonstrates good insight into disease process. Alert and Oriented x 3. pleasant and cooperative. General Notes: Upon inspection patient's wound bed actually showed signs of good granulation and epithelization at this point. Fortunately I do not see any evidence of active infection locally or systemically which is great and overall I am extremely pleased with where we stand today. Integumentary (Hair, Skin) Wound #15 status is Open. Original cause of wound was Gradually Appeared. The date acquired was: 10/27/2021. The wound has been in treatment 6 weeks. The wound is located on the Right,Posterior Lower Leg. The wound measures 5.2cm length x 4cm width x 0.2cm depth; 16.336cm^2 area and 3.267cm^3 volume. There is Fat Layer (Subcutaneous Tissue) exposed. There is  no tunneling or undermining noted. There is a large amount of serosanguineous drainage noted. The wound margin is indistinct and nonvisible. There is medium (34-66%) pink, pale granulation within the wound bed. There is a medium (34-66%) amount of necrotic tissue within the wound bed including Adherent Slough. Wound #16 status is Open. Original cause of wound was Gradually Appeared. The date acquired was: 10/26/2021. The wound has been in treatment 2 weeks. The wound is located on the Right,Medial Lower Leg. The wound measures 2cm length x 1.5cm width x 0.3cm depth; 2.356cm^2 area and 0.707cm^3 volume. There is Fat Layer (Subcutaneous Tissue) exposed. There is no tunneling or undermining noted. There is a medium amount of serosanguineous drainage noted. There is medium (34-66%) pink, hyper - granulation within the wound bed. There is a medium (34-66%) amount of necrotic tissue within the wound bed including Adherent Slough. EVAH, RASHID ([id number]) Assessment Active Problems ICD-10 Lymphedema, not elsewhere classified Chronic venous hypertension (idiopathic) with ulcer and inflammation of right lower extremity Non-pressure chronic ulcer of other part of right lower leg limited to breakdown of skin Procedures Wound #15 Pre-procedure diagnosis of Wound #15 is a Lymphedema located on the Right,Posterior Lower Leg . There was a Three Layer Compression Therapy Procedure by Levora Dredge, RN. Post procedure Diagnosis Wound #15: Same as Pre-Procedure Wound #16 Pre-procedure diagnosis of Wound #16 is a Lymphedema located on the Right,Medial Lower Leg .  There was a Three Layer Compression Therapy Procedure by Levora Dredge, RN. Post procedure Diagnosis Wound #16: Same as Pre-Procedure Plan Follow-up Appointments: Return Appointment in 1 week. Nurse Visit as needed Bathing/ Shower/ Hygiene: May shower with wound dressing protected with water repellent cover or cast protector. Edema  Control - Lymphedema / Segmental Compressive Device / Other: Optional: One layer of unna paste to top of compression wrap (to act as an anchor). Elevate, Exercise Daily and Avoid Standing for Long Periods of Time. Elevate leg(s) parallel to the floor when sitting. DO YOUR BEST to sleep in the bed at night. DO NOT sleep in your recliner. Long hours of sitting in a recliner leads to swelling of the legs and/or potential wounds on your backside. WOUND #15: - Lower Leg Wound Laterality: Right, Posterior Cleanser: Soap and Water Discharge Instructions: Gently cleanse wound with antibacterial soap, rinse and pat dry prior to dressing wounds Topical: Triamcinolone Acetonide Cream, 0.1%, 15 (g) tube Discharge Instructions: Apply as directed by provider. apply to wound and leg Primary Dressing: Xtrasorb Classic Super Absorbent Dressing, 6x9 (in/in) Discharge Instructions: over open areas of the leg that are weeping Compression Wrap: 3-LAYER WRAP - Profore Lite LF 3 Multilayer Compression Bandaging System Discharge Instructions: Apply 3 multi-layer wrap as prescribed. WOUND #16: - Lower Leg Wound Laterality: Right, Medial Cleanser: Soap and Water Discharge Instructions: Gently cleanse wound with antibacterial soap, rinse and pat dry prior to dressing wounds Topical: Triamcinolone Acetonide Cream, 0.1%, 15 (g) tube Discharge Instructions: Apply as directed by provider. apply to wound and leg Primary Dressing: Xtrasorb Classic Super Absorbent Dressing, 6x9 (in/in) Discharge Instructions: over open areas of the leg that are weeping Compression Wrap: 3-LAYER WRAP - Profore Lite LF 3 Multilayer Compression Bandaging System Discharge Instructions: Apply 3 multi-layer wrap as prescribed. 1. Would recommend that we going to continue with the wound care measures as before and the patient is in agreement with the plan. This includes the use of the triamcinolone to the wound bed which I think is doing a great  job. 2. We will cover this with Lauraine Rinne which I think is doing awesome. 3. We will also continue with the 3 layer compression wrap which I think is doing excellent. RORI, GOAR ([id number]) We will see patient back for reevaluation in 1 week here in the clinic. If anything worsens or changes patient will contact our office for additional recommendations. Electronic Signature(s) Signed: 01/08/2022 11:51:08 AM By: Worthy Keeler PA-C Entered By: Worthy Keeler on 01/08/2022 11:51:08 Frazee, [name] ([id number]) -------------------------------------------------------------------------------- SuperBill Details Patient Name: PRISCA, GEARING. Date of Service: 01/08/2022 Medical Record Number: [id number] Patient Account Number: [id number] Date of Birth/Sex: January 02, 1946 (76 y.o. F) Treating RN: Levora Dredge Primary Care Provider: [name] Other Clinician: Referring Provider: [name] Treating Provider/Extender: [name] in Treatment: 6 Diagnosis Coding ICD-10 Codes Code Description I89.0 Lymphedema, not elsewhere classified I87.331 Chronic venous hypertension (idiopathic) with ulcer and inflammation of right lower extremity L97.811 Non-pressure chronic ulcer of other part of right lower leg limited to breakdown of skin Facility Procedures CPT4 Code: [id number] Description: (Facility Use Only) [phone] - APPLY MULTLAY COMPRS LWR RT LEG Modifier: Quantity: 1 Physician Procedures CPT4 Code Description: [id number] - WC PHYS LEVEL 4 - EST PT Modifier: Quantity: 1 CPT4 Code Description: ICD-10 Diagnosis Description I89.0 Lymphedema, not elsewhere classified I87.331 Chronic venous hypertension (idiopathic) with ulcer and inflammation of rig L97.811 Non-pressure chronic ulcer of other part of right lower leg limited  to brea Modifier: ht lower extremit kdown of skin Quantity: y Engineer, maintenance) Signed: 01/08/2022 11:52:04 AM By: Worthy Keeler PA-C Entered  By: Worthy Keeler on 01/08/2022 11:52:03

## 2022-01-15 ENCOUNTER — Encounter (HOSPITAL_BASED_OUTPATIENT_CLINIC_OR_DEPARTMENT_OTHER): Payer: Medicare Other | Admitting: Internal Medicine

## 2022-01-15 ENCOUNTER — Other Ambulatory Visit: Payer: Self-pay

## 2022-01-15 DIAGNOSIS — L97811 Non-pressure chronic ulcer of other part of right lower leg limited to breakdown of skin: Secondary | ICD-10-CM | POA: Diagnosis not present

## 2022-01-15 DIAGNOSIS — Z85820 Personal history of malignant melanoma of skin: Secondary | ICD-10-CM | POA: Diagnosis not present

## 2022-01-15 DIAGNOSIS — I890 Lymphedema, not elsewhere classified: Secondary | ICD-10-CM | POA: Diagnosis not present

## 2022-01-15 DIAGNOSIS — I87331 Chronic venous hypertension (idiopathic) with ulcer and inflammation of right lower extremity: Secondary | ICD-10-CM

## 2022-01-15 DIAGNOSIS — I872 Venous insufficiency (chronic) (peripheral): Secondary | ICD-10-CM | POA: Diagnosis not present

## 2022-01-15 DIAGNOSIS — I10 Essential (primary) hypertension: Secondary | ICD-10-CM | POA: Diagnosis not present

## 2022-01-15 DIAGNOSIS — G4733 Obstructive sleep apnea (adult) (pediatric): Secondary | ICD-10-CM | POA: Diagnosis not present

## 2022-01-15 NOTE — Progress Notes (Signed)
SANDEE, [name] ([id number]) Visit Report for 01/15/2022 Chief Complaint Document Details Patient Name: [name], [name]. Date of Service: 01/15/2022 11:15 AM Medical Record Number: [id number] Patient Account Number: [id number] Date of Birth/Sex: 1945/12/10 (76 y.o. F) Treating RN: Donnamarie Poag Primary Care Provider: [name] Other Clinician: Referring Provider: [name] Treating Provider/Extender: Yaakov Guthrie in Treatment: 7 Information Obtained from: Patient Chief Complaint Right LE Ulcer Electronic Signature(s) Signed: 01/15/2022 11:58:37 AM By: Kalman Shan DO Entered By: Kalman Shan on 01/15/2022 11:53:40 Streb, Amber Child ([id number]) -------------------------------------------------------------------------------- HPI Details Patient Name: [name], [name]. Date of Service: 01/15/2022 11:15 AM Medical Record Number: [id number] Patient Account Number: [id number] Date of Birth/Sex: 05-23-1946 (76 y.o. F) Treating RN: Donnamarie Poag Primary Care Provider: [name] Other Clinician: Referring Provider: [name] Treating Provider/Extender: Yaakov Guthrie in Treatment: 7 History of Present Illness HPI Description: Pleasant 76 year old with history of chronic venous insufficiency. No diabetes or peripheral vascular disease. Left ABI 1.29. Questionable history of left lower extremity DVT. She developed a recurrent ulceration on her left lateral calf in December 2015, which she attributes to poor diet and subsequent lower extremity edema. She underwent endovenous laser ablation of her left greater saphenous vein in 2010. She underwent laser ablation of accessory branch of left GSV in April 2016 by Dr. Kellie Simmering at [facility]. She was previously wearing Unna boots, which she tolerated well. Tolerating 2 layer compression and cadexomer iodine. She returns to clinic for follow-up and is without new complaints. She denies any significant pain at  this time. She reports persistent pain with pressure. No claudication or ischemic rest pain. No fever or chills. No drainage. READMISSION 11/13/16; this is a 76 year old woman who is not a diabetic. She is here for a review of a painful area on her left medial lower extremity. I note that she was seen here previously last year for wound I believe to be in the same area. At that time she had undergone previously a left greater saphenous vein ablation by Dr. Kellie Simmering and she had a ablation of the anterior accessory branch of the left greater saphenous vein in March 2016. Seeing that the wound actually closed over. In reviewing the history with her today the ulcer in this area has been recurrent. She describes a biopsy of this area in 2009 that only showed stasis physiology. She also has a history of today malignant melanoma in the right shoulder for which she follows with Dr. 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 Do not see that she has ever had arterial studies in the left leg. She tells me she has a follow-up with Dr. Kellie Simmering in roughly 10 days In any case she developed the reopening of this area roughly a month ago. On the background of this she describes rapidly increasing edema which has responded to Lasix 40 mg and metolazone 2.5 mg as well as the patient's lymph massage. She has been told she has both venous insufficiency and lymphedema but she cannot tolerate compression stockings 11/28/16; the patient saw Dr. Kellie Simmering recently. Per the patient he did arterial Dopplers in the office that did not show evidence of arterial insufficiency, per the patient he stated "treat this like an ordinary venous ulcer". She also saw her dermatologist Dr. Ronnald Ramp who felt that this was more of a vascular ulcer. In general things are improving although she arrives today  with increasing bilateral lower  extremity edema with weeping a deeper fluid through the wound on the left medial leg compatible with some degree of lymphedema 12/04/16; the patient's wound is fully epithelialized but I don't think fully healed. We will do another week of depression with Promogran and TCA however I suspect we'll be able to discharge her next week. This is a very unusual-looking wound which was initially a figure-of-eight type wound lying on its side surrounded by petechial like hemorrhage. She has had venous ablation on this side. She apparently does not have an arterial issue per Dr. Kellie Simmering. She saw her dermatologist thought it was "vascular". Patient is definitely going to need ongoing compression and I talked about this with her today she will go to elastic therapy after she leaves here next week 12/11/16; the patient's wound is not completely closed today. She has surrounding scar tissue and in further discussion with the patient it would appear that she had ulcers in this area in 2009 for a prolonged period of time ultimately requiring a punch biopsy of this area that only showed venous insufficiency. I did not previously pickup on this part of the history from the patient. 12/18/16; the patient's wound is completely epithelialized. There is no open area here. She has significant bilateral venous insufficiency with secondary lymphedema to a mild-to-moderate degree she does not have compression stockings.. She did not say anything to me when I was in the room, she told our intake nurse that she was still having pain in this area. This isn't unusual recurrent small open area. She is going to go to elastic therapy to obtain compression stockings. 12/25/16; the patient's wound is fully epithelialized. There is no open area here. The patient describes some continued episodic discomfort in this area medial left calf. However everything looks fine and healed here. She is been to elastic therapy and caught herself 15-20 mmHg  stockings, they apparently were having trouble getting 20-30 mm stockings in her size 01/22/17; this is a patient we discharged from the clinic a month ago. She has a recurrent open wound on her medial left calf. She had 15 mm support stockings. I told her I thought she needed 20-30 mm compression stockings. She tells me that she has been ill with hospitalization secondary to asthma and is been found to have severe hypokalemia likely secondary to a combination of Lasix and metolazone. This morning she noted blistering and leaking fluid on the posterior part of her left leg. She called our intake nurse urgently and we was saw her this afternoon. She has not had any real discomfort here. I don't know that she's been wearing any stockings on this leg for at least 2-3 days. ABIs in this clinic were 1.21 on the right and 1.3 on the left. She is previously seen vascular surgery who does not think that there is a peripheral arterial issue. 01/30/17; Patient arrives with no open wound on the left leg. She has been to elastic therapy and obtained 20-48mhg below knee stockings and she has one on the right leg today. READMISSION 02/19/18; this PPickingis a now 76year old patient we've had in this clinic perhaps 3 times before. I had last looked at her from January 07 December 2016 with an area on the medial left leg. We discharged her on 12/25/16 however she had to be readmitted on 01/22/17 with a recurrence. I have in my notes that we discharged her on 20-30 mm stockings although she tells me she was  only wearing support hose because she cannot get stockings on predominantly related to her cervical spine surgery/issues. She has had previous ablations done by vein and vascular in [place] including a great saphenous vein ablation on the left with an anterior accessory branch ablation I think both of these were in 2016. On one of the previous visit she had a biopsy noted 2009 that was negative. She is not felt to  have an arterial issue. She is not a diabetic. She does have a history of obstructive sleep apnea hypertension asthma as well as chronic venous insufficiency and lymphedema. On this occasion she noted 2 dry scaly patch on her left leg. She tried to put lotion on this it didn't really help. There were 2 open areas.the patient has been seeing her primary physician from 02/05/18 through 02/14/18. She had Unna boots applied. The superior wound now on the lateral left leg has closed but she's had one wound that remains open on the lateral left leg. This is not the same spot as we dealt with in 2018. ABIs in this clinic were 1.3 bilaterally [name], Amber ([id number]) 02/26/18; patient has a small wound on the left lateral calf. Dimensions are down. She has chronic venous insufficiency and lymphedema. 03/05/18; small open area on the left lateral calf. Dimensions are down. Tightly adherent necrotic debris over the surface of the wound which was difficult to remove. Also the dressing [over collagen] stuck to the wound surface. This was removed with some difficulty as well. Change the primary dressing to Hydrofera Blue ready 03/12/18; small open area on the left lateral calf. Comes in with tightly adherent surface eschar as well as some adherent Hydrofera Blue. 03/19/18; open area on the left lateral calf. Again adherent surface eschar as well as some adherent Hydrofera Blue nonviable subcutaneous tissue. She complained of pain all week even with the reduction from 4-3 layer compression I put on last week. Also she had an increase in her ankle and calf measurements probably related to the same thing. 03/26/18; open area on the left lateral calf. A very small open area remains here. We used silver alginate starting last week as the Hydrofera Blue seem to stick to the wound bed. In using 4-layer compression 04/02/18; the open area in the left lateral calf at some adherent slough which I removed there is no open area  here. We are able to transition her into her own compression stocking. Truthfully I think this is probably his support hose. However this does not maintain skin integrity will be limited. She cannot put over the toe compression stockings on because of neck problems hand problems etc. She is allergic to the lining layer of juxta lites. We might be forced to use extremitease stocking should this fail READMIT 11/24/2018 Patient is now a 76 year old woman who is not a diabetic. She has been in this clinic on at least 3 previous occasions largely with recurrent wounds on her left leg secondary to chronic venous insufficiency with secondary lymphedema. Her situation is complicated by inability to get stockings on and an allergy to neoprene which is apparently a component and at least juxta lites and other stockings. As a result she really has not been wearing any stockings on her legs. She tells [place] that roughly 2 or 3 weeks ago she started noticing a stinging sensation just above her ankle on the left medial aspect. She has been diagnosed with pseudogout and she wondered whether this was what she was experiencing. She  tried to dress this with something she bought at the store however subsequently it pulled skin off and now she has an open wound that is not improving. She has been using Vaseline gauze with a cover bandage. She saw her primary doctor last week who put an Haematologist on her. ABIs in this clinic was 1.03 on the left 2/12; the area is on the left medial ankle. Odd-looking wound with what looks to be surface epithelialization but a multitude of small petechial openings. This clearly not closed yet. We have been using silver alginate under 3 layer compression with TCA 2/19; the wound area did not look quite as good this week. Necrotic debris over the majority of the wound surface which required debridement. She continues to have a multitude of what looked to be small petechial openings. She  reminds [place] that she had a biopsy on this initially during her first outbreak in 2015 in [place] dermat[facility]. She expresses concern about this being a possible melanoma. She apparently had a nodular melanoma up on her shoulder that was treated with excision, lymph node removal and ultimately radiation. I assured her that this does not look anything like melanoma. Except for the petechial reaction it does look like a venous insufficiency area and she certainly has evidence of this on both sides 2/26; a difficult area on the left medial ankle. The patient clearly has chronic venous hypertension with some degree of lymphedema. The odd thing about the area is the small petechial hemorrhages. I am not really sure how to explain this. This was present last time and this is not a compression injury. We have been using Hydrofera Blue which I changed to last week 3/4; still using Hydrofera Blue. Aggressive debridement today. She does not have known arterial issues. She has seen Dr. Kellie Simmering at [facility] vein and vascular and and has an ablation on the left. [Anterior accessory branch of the greater saphenous]. From what I remember they did not feel she had an arterial issue. The patient has had this area biopsied in 2009 at [facility] dermatology and by her recollection they said this was "stasis". She is also follow-up with dermatology locally who thought that this was more of a vascular issue 3/11; using Hydrofera Blue. Aggressive debridement today. She does not have an arterial issue. We are using 3 layer compression although we may need to go to 4. The patient has been in for multiple changes to her wrap since I last saw her a week ago. She says that the area was leaking. I do not have too much more information on what was found 01/19/19 on evaluation today patient was actually being seen for a nurse visit when unfortunately she had the area on her left lateral lower extremity as well as weeping from the  right lower extremity that became apparent. Therefore we did end up actually seeing her for a full visit with myself. She is having some pain at this site as well but fortunately nothing too significant at this point. No fevers, chills, nausea, or vomiting noted at this time. 3/18-Patient is back to the clinic with the left leg venous leg ulcer, the ulcer is larger in size, has a surface that is densely adherent with fibrinous tissue, the Hydrofera Blue was used but is densely adherent and there was difficulty in removing it. The right lower extremity was also wrapped for weeping edema. Patient has a new area over the left lateral foot above the malleolus that is small and  appears to have no debris with intact surrounding skin. Patient is on increased dose of Lasix also as a means to edema management 3/25; the patient has a nonhealing venous ulcer on the medial left leg and last week developed a smaller area on the lateral left calf. We have been using Hydrofera Blue with a contact layer. 4/1; no major change in these wounds areas. Left medial and more recently left lateral calf. I tried Iodoflex last week to aid in debridement she did not tolerate this. She stated her pain was terrible all week. She took the top layer of the 4 layer compression off. 4/8; the patient actually looks somewhat better in terms of her more prominent left lateral calf wound. There is some healthy looking tissue here. She is still complaining of a lot of discomfort. 4/15; patient in a lot of pain secondary to sciatica. She is on a prednisone taper prescribed by her primary physician. She has the 2 areas one on the left medial and more recently a smaller area on the left lateral calf. Both of these just above the malleoli 4/22; her back pain is better but she still states she is very uncomfortable and now feels she is intolerant to the [employer]. No real change in the wounds we have been using Sorbact. She has been  previously intolerant to Iodoflex. There is not a lot of option about what we can use to debride this wound under compression that she no doubt needs. sHe states Ultram no longer works for her pain 4/29; no major change in the wounds slightly increased depth. Surface on the original medial wound perhaps somewhat improved however the more recent area on the lateral left ankle is 100% covered in very adherent debris we have been using Sorbact. She tolerates 4 layer compression well and her edema control is a lot better. She has not had to come in for a nurse check 5/6; no major change in the condition of the wounds. She did consent to debridement today which was done with some difficulty. Continuing Sorbact. She did not tolerate Iodoflex. She was in for a check of her compression the day after we wrapped her last week this was adjusted but nothing much was found 5/13; no major change in the condition or area of the wounds. I was able to get a fairly aggressive debridement done on the lateral left leg wound. Even using Sorbact under compression. She came back in on Friday to have the wrap changed. She says she felt uncomfortable on the lateral aspect of her ankle. She has a long history of chronic venous insufficiency including previous ablation surgery on this side. 5/20-Patient returns for wounds on left leg with both wounds covered in slough, with the lateral leg wound larger in size, she has been in 3 layer compression and felt more comfortable, she describes pain in ankle, in leg and pins and needles in foot, and is about to try Pamelor for this 6/3; wounds on the left lateral and left medial leg. The area medially which is the most recent of the 2 seems to have had the largest increase in [place], [name]. ([id number]) dimensions. We have been using Sorbac to try and debride the surface. She has been to see orthopedics they apparently did a plain x-ray that was indeterminant. Diagnosed her with  neuropathy and they have ordered an MRI to determine if there is underlying osteomyelitis. This was not high on my thought list but I suppose it is  prudent. We have advised her to make an appointment with vein and vascular in [place]. She has a history of a left greater saphenous and accessory vein ablations I wonder if there is anything else that can be done from a surgical point of view to help in these difficult refractory wounds. We have previously healed this wound on one occasion but it keeps on reopening [medial side] 6/10; deep tissue culture I did last week I think on the left medial wound showed both moderate E. coli and moderate staph aureus [MSSA]. She is going to require antibiotics and I have chosen Augmentin. We have been using Sorbact and we have made better looking wound surface on both sides but certainly no improvement in wound area. She was back in last Friday apparently for a dressing changes the wrap was hurting her outer left ankle. She has not managed to get a hold of vein and vascular in [place]. We are going to have to make her that appointment 6/17; patient is tolerating the Augmentin. She had an MRI that I think was ordered by orthopedic surgeon this did not show osteomyelitis or an abscess did suggest cellulitis. We have been using Sorbact to the lateral and medial ankles. We have been trying to arrange a follow-up appointment with vein and vascular in [place] or did her original ablations. We apparently an area sent the request to vein and vascular in [facility] 6/24; patient has completed the Augmentin. We do not yet have a vein and vascular appointment in [place]. I am not sure what the issue is here we have asked her to call tomorrow. We are using Sorbact. Making some improvements and especially the medial wound. Both surfaces however look better medial and lateral. 7/1; the patient has been in contact with vein and vascular in [place] but has not yet  received an appointment. Using Sorbact we have gradually improve the wound surface with no improvement in surface area. She is approved for Apligraf but the wound surface still is not completely viable. She has not had to come in for a dressing change 7/8; the patient has an appointment with vein and vascular on 7/31 which is a Friday afternoon. She is concerned about getting back here for [place] to dress her wounds. I think it is important to have them goal for her venous reflux/history of ablations etc. to see if anything else can be done. She apparently tested positive for 1 of the blood tests with regards to lupus and saw a rheumatologist. He has raised the issue of vasculitis again. I have had this thought in the past however the evidence seems overwhelming that this is a venous reflux etiology. If the rheumatologist tells me there is clinical and laboratory investigation is positive for lupus I will rethink this. 7/15; the patient's wound surfaces are quite a bit better. The medial area which was her original wound now has no depth although the lateral wound which was the more recent area actually appears larger. Both with viable surfaces which is indeed better. Using Sorbact. I wanted to use Apligraf on her however there is the issue of the vein and vascular appointment on 7/31 at 2:00 in the afternoon which would not allow her to get back to be rewrapped and they would no doubt remove the graft 7/22; the patient's wound surfaces have moderate amount of debris although generally look better. The lateral one is larger with 2 small satellite areas superiorly. We are waiting for her vein and vascular appointment  on 7/31. She has been approved for Apligraf which I would like to use after th 7/29; wound surfaces have improved no debridement is required we have been using Sorbact. She sees vein and vascular on Friday with this so question of whether anything can be done to lessen the likelihood of  recurrence and/or speed the healing of these areas. She is already had previous ablations. She no doubt has severe venous hypertension 8/5-Patient returns at 1 week, she was in [place] for 3 days by her podiatrist, we have been using so backed to the wound, she has increased pain in both the wounds on the left lower leg especially the more distal one on the lateral aspect 8/12-Patient returns at 1 week and she is agreeable to having debridement in both wounds on her left leg today. We have been using Sorbact, and vascular studies were reviewed at last visit 8/19; the patient arrives with her wounds fairly clean and no debridement is required. We have used Sorbact which is really done a nice job in cleaning up these very difficult wound surfaces. The patient saw Dr. Donzetta Matters of vascular surgery on 7/31. He did not feel that there was an arterial component. He felt that her treated greater saphenous vein is adequately addressed and that the small saphenous vein did not appear to be involved significantly. She was also noted to have deep venous reflux which is not treatable. Dr. Donzetta Matters mentioned the possibility of a central obstructive component leading to reflux and he offered her central venography. She wanted to discuss this or think about it. I have urged her to go ahead with this. She has had recurrent difficult wounds in these areas which do heal but after months in the clinic. If there is anything that can be done to reduce the likelihood of this I think it is worth it. 9/2 she is still working towards getting follow-up with Dr. Donzetta Matters to schedule her CT. Things are quite a bit worse venography. I put Apligraf on 2 weeks ago on both wounds on the medial and lateral part of her left lower leg. She arrives in clinic today with 3 superficial additional wounds above the area laterally and one below the wound medially. She describes a lot of discomfort. I think these are probably wrapped injuries. Does not  look like she has cellulitis. 07/20/2019 on evaluation today patient appears to be doing somewhat poorly in regard to her lower extremity ulcers. She in fact showed signs of erythema in fact we may even be dealing with an infection at this time. Unfortunately I am unsure if this is just infection or if indeed there may be some allergic reaction that occurred as a result of the Apligraf application. With that being said that would be unusual but nonetheless not impossible in this patient is one who is unfortunately allergic to quite a bit. Currently we have been using the Sorbact which seems to do as well as anything for her. I do think we may want to obtain a culture today to see if there is anything showing up there that may need to be addressed. 9/16; noted that last week the wounds look worse in 1 week follow-up of the Apligraf. Using Sorbact as of 2 days ago. She arrives with copious amounts of drainage and new skin breakdown on the back of the left calf. The wounds arm more substantial bilaterally. There is a fair amount of swelling in the left calf no overt DVT there is edema  present I think in the left greater than right thigh. She is supposed to go on 9/28 for CT venography. The wounds on the medial and lateral calf are worse and she has new skin breakdown posteriorly at least new for me. This is almost developing into a circumferential wound area The Apligraf was taken off last week which I agree with things are not going in the right direction a culture was done we do not have that back yet. She is on Augmentin that she started 2 days ago 9/23; dressing was changed by her nurses on Monday. In general there is no improvement in the wound areas although the area looks less angry than last week. She did get Augmentin for MSSA cultured on the 14th. She still appears to have too much swelling in the left leg even with 3 layer compression 9/30; the patient underwent her procedure on 9/28 by Dr.  Donzetta Matters at [facility]. She was discovered to have the common iliac vein measuring 12.2 mm but at the level of L4-L5 measured 3 mm. After stenting it measured 10 mm. 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 Rouleaux flow in the common femoral and femoral vein was observed much improved after stenting. We are using silver alginate to the wounds on the medial and lateral ankle on the left. 4 layer compression 10/7; the patient had fluid swelling around her knee and 4 layer compression. At the advice of vein and vascular this was reduced to 3 layer which she is tolerating better. We have been using silver alginate under 3 layer compression since last Friday 10/14; arrives with the areas on the left ankle looking a lot better. Inflammation in the area also a lot better. She came in for a nurse check on 10/9 10/21; continued nice improvement. Slight improvements in surface area of both the medial and lateral wounds on the left. A lot of the satellite lesions in the weeping erythema around these from stasis dermatitis is resolved. We have been using silver alginate [name], Amber ([id number]) 10/28; general improvement in the entire wound areas although not a lot of change in dimensions the wound certainly looks better. There is a lot less in terms of venous inflammation. Continue silver alginate this week however look towards Hydrofera Blue next week 11/4; very adherent debris on the medial wound left wound is not as bad. We have been using silver alginate. Change to [facility] today 11/11; very adherent debris on both wound areas. She went to vein and vascular last week and follow-up they put in [place]ners boot on this today. He says the [facility] was adherent. Wound is definitely not as good as last week. Especially on the left there the satellite lesions look more prominent 11/18; absolutely no better. erythema on lateral aspect with tenderness. 09/30/2019 on  evaluation today patient appears to actually be doing better. Dr. Dellia Nims did put her on doxycycline last week which I do believe has helped her at this point. Fortunately there is no signs of active infection at this time. No fevers, chills, nausea, vomiting, or diarrhea. I do believe he may want extend the doxycycline for 7 additional days just to ensure everything does completely cleared up the patient is in agreement with that plan. Otherwise she is going require some sharp debridement today 12/2; patient is completing a 2-week course of doxycycline. I gave her this empirically for inflammation as well as infection when I last saw her 2 weeks  ago. All of this seems to be better. She is using silver alginate she has the area on the medial aspect of the larger area laterally and the 2 small satellite regions laterally above the major wound. 12/9; the patient's wound on the left medial and left lateral calf look really quite good. We have been using silver alginate. She saw vein and vascular in follow-up on 10/09/2019. She has had a previous left greater saphenous vein ablation by Dr. Oscar La in 2016. More recently she underwent a left common iliac vein stent by Dr. 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 The swelling is improved and certainly the wounds have improved. The patient shows [place] today area on the right medial calf there is almost no wound but leaking lymphedema. She says she start this started 3 or 4 days ago. She did not traumatize it. It is not painful. She does not wear compression on that side 12/16; the patient continues to do well laterally. Medially still requiring debridement. The area on the right calf did not materialize to anything and is not currently open. We wrapped this last time. She has support stockings for that leg although I am not sure they are going to provide adequate compression 12/23; the lateral wound looks stable. Medially still requiring debridement for  tightly adherent fibrinous debris. We've been using silver alginate. Surface area not any different 12/30; neither wound is any better with regards to surface and the area on the left lateral is larger. I been using silver alginate to the left lateral which look quite good last week and Sorbact to the left medial 11/11/2019. Lateral wound area actually looks better and somewhat smaller. Medial still requires a very aggressive debridement today. We have been using Sorbact on both wound areas 1/13; not much better still adherent debris bilaterally. I been using Sorbact. She has severe venous hypertension. Probably some degree of dermal fibrosis distally. I wonder whether tighter compression might help and I am going to try that today. We also need to work on the bioburden 1/20; using Sorbact. She has severe venous hypertension status post stent placement for pelvic vein compression. We applied gentamicin last time to see if we could reduce bioburden I had some discussion with her today about the use of pentoxifylline. This is occasionally used in this setting for wounds with refractory venous insufficiency. However this interacts with Plavix. She tells me that she was put on this after stent placement for 3 months. She will call Dr. Claretha Cooper office to discuss 1/27; we are using gentamicin under Sorbact. 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 She has a stent. Wound medially is measuring smaller this week. Laterally measuring slightly larger although she has some satellite lesions superiorly 2/3; gentamicin under Sorbact under 4-layer compression. 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 She has a stent on Plavix. Her wounds are measuring smaller this week. More substantially laterally where there is a satellite lesion superiorly. 2/10; gentamicin under Sorbac. 4-layer compression. Patient communicated with Dr. Donzetta Matters at vein and vascular in  [place]. He is okay with the patient coming off Plavix I will therefore start her on pentoxifylline for a 1 month trial. In general her wounds look better today. I had some concerns about swelling in the left thigh however she measures 61.5 on the right and 63 on the mid thigh which does not suggest there is any difficulty. The patient is not describing any pain. 2/17; gentamicin under Sorbac 4-layer  compression. She has been on pentoxifylline for 1 week and complains of loose stool. No nausea she is eating and drinking well 2/24; the patient apparently came in 2 days ago for a nurse visit when her wrap fell down. Both areas look a little worse this week macerated medially and satellite lesions laterally. Change to silver alginate today 3/3; wounds are larger today especially medially. She also has more swelling in her foot lower leg and I even noted some swelling in her posterior thigh which is tender. I wonder about the patency of her stent. Fortuitously she sees Dr. Claretha Cooper group on Friday 3/10; Mrs. Corella was seen by vein and vascular on 3/5. The patient underwent ultrasound. There was no evidence of thrombosis involving the IVC no evidence of thrombosis involving the right common iliac vein there is no evidence of thrombosis involving the right external iliac vein the left external vein is also patent. The right common iliac vein stent appears patent bilateral common femoral veins are compressible and appear patent. I was concerned about the left common iliac stent however it looks like this is functional. She has some edema in the posterior thigh that was tender she still has that this week. I also note they had trouble finding the pulses in her left foot and booked her for an ABI baseline in 4 weeks. She will follow up in 6 months for repeat IVC duplex. The patient stopped the pentoxifylline because of diarrhea. It does not look like that was being effective in any case. I have advised her  to go back on her aspirin 81 mg tablet, vascular it also suggested this 3/17; comes in today with her wound surfaces a lot better. The excoriations from last week considerably better probably secondary to the TCA. We have been using silver alginate 3/24; comes in today with smaller wounds both medially and laterally. Both required debridement. There are 2 small satellite areas superiorly laterally. She also has a very odd bandlike area in the mid calf almost looking like there was a weakness in the wrap in a localized area. I would write this off as being this however anteriorly she has a small raised ballotable area that is very tender almost reminiscent of an abscess but there was no obvious purulent surface to it. 02/04/20 upon evaluation today patient appears to be doing fairly well in regard to her wounds today. Fortunately there is no signs of active infection at this time. No fevers, chills, nausea, vomiting, or diarrhea. She has been tolerating the dressing changes without complication. Fortunately I feel like she is showing signs of improvement although has been sometime since have seen her. Nonetheless the area of concern that Dr. Dellia Nims had last week where she had possibly an area of the wrap that was we can allow the leg to bulge appears to be doing significantly better today there is no signs of anything worsening. [name], Amber ([id number]) 4/7; the patient's wounds on her medial and lateral left leg continue to contract. We have been using a regular alginate. Last week she developed an area on the right medial lower leg which is probably a venous ulcer as well. 4/14; the wounds on her left medial and lateral lower leg continue to contract. Surface eschar. We have been using regular alginate. The area on the right medial lower leg is closed. We have been putting both legs under 4-layer contraction. The patient went back to see vein and vascular she had arterial studies done which  were apparently "quite good" per the patient although I have not read their notes I have never felt she had an arterial issue. 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 This is been longstanding and refractory to exercise, leg elevation and longstanding use of compression wraps in our clinic as well as compression stockings on the times we have been able to get these to heal 4/21; we thought she actually might be close this week however she arrives in clinic with a lot of edema in her upper left calf and into her posterior thigh. This is been an intermittent problem here. She says the wrap fell down but it was replaced with a nurse visit on Monday. We are using calcium alginate to the wounds and the wound sizes there not terribly larger than last week but there is a lot more edema 4/28; again wound edges are smaller on both sides. Her edema is better controlled than last time. She is obtained her compression pumps from [employer] although they have not been to her home to set these up. 5/5; left medial and left lateral both look stable. I am not sure the medial is any smaller. We have been using calcium alginate under 4-layer compression. oShe had an area on the right medial. This was eschared today. We have been wrapping this as well. She does not tolerate external compression stockings due to a history of various contact allergies. She has her compression pumps however the representative from the company is coming on her to show her how to use these tomorrow 5/19; patient with severe chronic venous insufficiency secondary to central venous disease. She had a stent placed in her left common iliac vein. She has done better since but still difficult to control wounds. She comes in today with nothing open on the right leg. Her areas on the left medial and left lateral are just about closed. We are using calcium alginate under 4-layer compression.  She is using her external compression pumps at home She only has 15-20 support stockings. States she cannot get anything tighter than that on. 03/30/20-Patient returns at 1 week, the wounds on the left leg are both slightly bigger, the last week she was on 3 layer compression which started to slide down. She is starting to use her lymphedema pumps although she stated on 1 day her right ankle started to swell up and she have to stop that day. Unfortunately the open area seem to oscillate between improving to the point of healing and then flaring up all to do with effectiveness of compression or lack of due to the left leg topography not keeping the compression wraps from rolling down 6/2 patient comes in with a 15/20 mmHg stocking on the right leg. She tells me that she developed a lot of swelling in her ankles she saw orthopedics she was felt to possibly be having a flare of pseudogout versus some other type of arthritis. She was put on steroids for a respiratory issue so that helps with the inflammation. She has not been using the pumps all week. She thinks the left thigh is more swollen than usual and I would agree with that. She has an appointment with Dr. Donzetta Matters 9 days or so from now 6/9; both wounds on the left medial and left lateral are smaller. We have been using calcium alginate under compression. She does not have an open wound on the right leg she is using a stocking and her compression pumps  things are going well. She has an appointment with Dr. Donzetta Matters with regards to her stent in the left common iliac vein 6/16; the wounds on the left medial and left lateral ankle continues to contract. The patient saw Dr. Donzetta Matters and I think he seems satisfied. Ordered follow-up venous reflux studies on both sides in September. Cautioned that she may need thigh-high stockings. She has been using calcium alginate under compression on the left and her own stocking on the right leg. She tells [place] there are no open  wounds on the right 6/23; left lateral is just about closed. Medial required debridement today. We have been using calcium alginate. Extensive discussion about the compression pumps she is only using these on 25 mmHg states she could not take 40 or 30 when the wrap came out to her home to demonstrate these. He said they should not feel tight 6/30; the left lateral wound has a slight amount of eschar. . The area medially is about the same using Hydrofera Blue. 7/7; left lateral wound still has some eschar. I will remove this next week may be closed. The area medially is very small using Hydrofera Blue with improvement. Unfortunately the stockings fell down. Unfortunately the blisters have developed at the edge of where the wrap fell. When this happened she says her legs hurt she did not use her pumps. We are not open Monday for her to come in and change the wraps and she had an appointment yesterday. She also tells me that she is going to have an MRI of her back. She is having pain radiating into her left anterior leg she thinks her from an L5 disc. She saw Dr. Ellene Route of neurosurgery 7/14; the area on the left lateral ankle area is closed. Still a small area medially however it looks better as well. We have been using Hydrofera Blue under 4-layer compression 7/21; left lateral ankle is still closed however her wound on the medial left calf is actually larger. This is probably because Hydrofera Blue got stuck to the wound. She came in for a nurse change on Friday and will do that again this week I was concerned about the amount of swelling that she had last week however she is using her compression pumps twice a day and the swelling seems well controlled 7/28; remaining wound on the left medial lower leg is smaller. We have been using moistened silver collagen under compression she is coming back for a nurse visit. For reasons that were not really clear she was just keeping her legs elevated and not  using her compression pumps. I have asked her to use the compression pumps. She does not have any wounds on the right leg 06/15/20-Patient returns at 2 weeks, her LLE edema is worse and she developed a blister wound that is new and has bigger posterior calf wound on right, we are using Prisma with pad, 4 layer compression. she has been on lasix 40 mg daily 8/18; patient arrives today with things a lot worse than I remember from a few weeks ago. She was seen last week. Noted that her edema was worse and that she now had a left lateral wound as well as deteriorating edema in the medial and posterior part of the lower leg. She says she is using his or her external compression pumps once a day although I wonder about the compliance. 8/25; weeping area on the right medial lower leg. This had actually gotten a small localized area of her  compression stocking wet. oOn the left side there is a large denuded area on the posterior medial lower leg and smaller area on the lateral. This was not the original areas that we dealt with. 9/1 the patient's wound on the left leg include the left lateral and left posterior. Larger superficial wounds weeping. She has very poor edema control. Tender localized edema in the left lower medial ankle/heel probably because of localized wrap issues. She freely admits she is not using the compression pumps. She has been up on her feet a lot. She thinks the hydrofera blue is contributing to the pain she is experiencing.. This is a complaint that I have occasionally heard 9/8; really not much improvement. The patient is still complaining of a lot of pain particularly when she uses compression pumps. I switched her to silver alginate last time because she found the Hydrofera Blue to be irritating. I don't hear much difference in her description with the silver alginate. 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 She has follow-up with Dr. Donzetta Matters I think for ultrasound next month MABELL, ESGUERRA ([id number]) 9/15; quite a bit of improvement today. We have less edema and more epithelialization in both of her wound areas on the left medial and left lateral calf. These are not the site of her original wounds in this area. She says she has been using her compression pumps for 30 minutes twice a day, there pain issues that never quite understood. Silver alginate as the primary dressing 9/22; continued improvement. Both areas medially and laterally still have a small open area there is some eschar. She continues to complain of left medial ankle pain. Swelling in the leg is in much better condition. We have been using silver alginate 9/29; continued improvement. Both areas medially and laterally in the left calf look as though they are close some minor surface eschar but I think this is epithelialized. She comes in today saying she has a ruptured disc at L4-L5 cannot bend over to put on her stockings. 10/6; patient comes in today with no open wounds on either leg. However her edema on the left leg in the upper one third of the lower leg is poorly controlled nonpitting. She says that she could not use the pumps for 2 days and then she has been using the last couple of days. It is not clear to me she has been able to get her stocking on. She has back problems. Mrs. [place] has severe chronic venous insufficiency with secondary lymphedema. Her venous insufficiency is partially centrally mediated and that she is now post stent in the left common iliac vein. [facility] Thurner's syndrome/physiology]. She follows up with them on 10/15. She wears 20/30 below-knee stockings. She is supposed to use compression pumps at home although I think her compliance about with this is been less than 100%. I have asked her to use these 3 times a day. Finally I think she has lipodermatosclerosis in the left lower leg with an inverted  bottle sign. It is been a major problem controlling the edema in the left leg. The right leg we have had wounds on but not as significant a problem is on the left READMISSION 04/12/2021 Mrs. Faas is a 76 year old woman we know well in this clinic. She has severe chronic venous insufficiency. 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  I believe she has had bilateral greater saphenous vein ablation in the past as well. She tells me that this wound opened sometime in March. She had a fall and thinks it was initially abrasion. She developed areas she describes as little blisters on the anterior part of her leg and she saw dermatology and was treated for methicillin staph aureus with several rounds of antibiotics. She has been using support stockings on the left leg and says this is the only thing she can get on. Her compression pump use maybe once a day she says if she did not use one she use the other. She comes in today with incredible swelling in the left leg with a wound on the left posterior calf. She has been using Neosporin to this previously a hydrocolloid. 6/15; patient arrives back for 1 week follow-up.Marland Kitchen Apparently her wrap fell down she did not call us to replace this. He has poor edema control. She only uses her compression pumps once a day 6/29; patient presents for 1 week follow-up. She has tolerated the compression wrap well. We have been using Iodoflex under the wrap. She has no issues or complaints today. 7/6; patient presents for 1 week follow-up. She states that the compression wrap rolled down her leg 4 days ago. She has been trying to keep the area covered but has no dressings at home to use. She denies signs of infection. 7/13; patient presents for 1 week follow-up. She states that her compression wrap rolled down her right leg and she called our office and had it placed a few days ago. She has been tolerating the current wrap well. She  states that the Iodoflex is causing a burning sensation. She denies signs of infection. 7/27; patient presents for 1 week follow-up She has tolerated the compression wrap well with Hydrofera Blue underneath. She has now developed a wound to her right lower extremity. She reports having a culture done To this area by her dermatologist that she reports is negative. She currently denies signs of infection. 8/30; patient presents for 1 week follow-up. On the left side she has tolerated the compression wrap well with Hydrofera Blue underneath. She reports some discomfort to her right lower extremity with the 3 layer compression. She currently denies signs of infection. 06/14/2021 upon evaluation today patient's wound actually appears to be doing decently well based on what I am seeing currently. She actually has 2 areas 1 on the left distal/posterior lower leg and the other on the right lower leg. Subsequently the measurements are roughly the same may be slightly smaller but in general have not made any trend towards getting worse which is great news. 06/23/2021 upon evaluation today patient appears to be doing pretty well in regard to her wounds. On the right she is having difficulty with sciatic pain and this subsequently has led to her taking the wrap off over the last week her leg is more swollen she is also been on prednisone for 10 days. With that being said I think we may want to just use a compression sock on the left that way she will not have to worry about the compression wrap being in place that she cannot get off. With that being said I do believe as well that the patient is going to need to continue with the wrapping on the left we will also need to do a little bit of sharp debridement today. 8/24; patient presents for 1 week follow-up. She has no issues or complaints  today. She denies signs of infection. 8/31; patient presents for 1 week follow-up. She has no issues or complaints today. She  has tolerated the compression wrap well on the left lower extremity. She denies signs of infection. 9/7; patient presents for follow-up. She reports that the compression wrap rolled down slightly on her right lower extremity. She reports tenderness to the wound bed. She denies increased warmth or erythema to the surrounding skin. She overall feels well. 9/14; patient presents for follow-up. She has no issues or complaints today. She denies signs of infection. PuraPly is available and patient would like to start this today. 9/21; patient presents for follow-up. She has no issues or complaints today. She tolerated the first skin substitute placement last week under compression. 9/28; patient presents for 1 week follow-up. She has no issues or complaints today. 10/5; patient presents for 1 week follow-up. She reports taking the wrap off yesterday. She was on her feet more yesterday and developed more swelling to her left lower extremity. She denies pain. Overall she is doing well. 10/12; patient presents for 1 week follow-up. She has no issues or complaints today. 10/19; patient presents for follow-up. She has no issues or complaints today. She tolerated the compression wrap well. She states she has compression stockings at home. SHAUGHNESSY, GETHERS ([id number]) Readmission: 11/27/2021 upon evaluation today patient appears to be doing poorly in regard to the wounds on her right leg. She also has an area of erythema in the proximal calf area of the right leg which again I do believe is a issue from the standpoint of it being warm and erythematous to touch and also seems to be somewhat localized I really think this may be more of a cellulitis issue than anything else. Fortunately I do not see any signs of active infection Systemically at this time. 12/05/2021 upon evaluation patient's wounds actually appear to be showing some signs of improvement which is not too bad and just 1 week's time. Especially  since we had to get on the right antibiotic which is only been for the past 4 days. Fortunately I do not see any signs of active infection locally nor systemically at this time that has gotten any worse. Overall I think that again with the antibiotic she is significantly improved and currently she is taking Levaquin. 12/11/2021 upon evaluation today patient appears to be doing well with regard to her leg. Between the antibiotics and the triamcinolone I feel like she is doing significantly better which is great news. Overall I am extremely pleased with where we stand. There does not appear to be any signs of active infection locally or systemically at this time. 12/18/2021 upon evaluation today patient appears to be doing well with regard to her wound. I do feel like this is showing signs of some improvement here. Little by little this is clearing up quite nicely. I think it is just a slow process but nonetheless 1 that we are seeing evidence of improvement with regard to. 12/25/2021 upon evaluation today patient appears to be doing well with regard to her wounds she is definitely making some good progress here. Fortunately I do not see any signs of active infection locally or systemically at this time which is great news. No fevers, chills, nausea, vomiting, or diarrhea. 01/02/2022 upon evaluation today patient appears to be doing well with regard to her wound. She has been tolerating the dressing changes without complication. Fortunately I do not see any evidence of active infection locally or  systemically which is great news and overall I am extremely pleased with where we stand today. 01/08/2022 upon evaluation today patient appears to be doing better in regard to her ankle ulcers. She has been tolerating the dressing changes without complication. Fortunately I do not see any signs of infection and both the ankle and leg area is making great progress. 3/13; patient presents for follow-up. She has no  issues or complaints today. She was tolerated the compression wrap well. Electronic Signature(s) Signed: 01/15/2022 11:58:37 AM By: Kalman Shan DO Entered By: Kalman Shan on 01/15/2022 11:54:07 Bankert, Amber Child ([id number]) -------------------------------------------------------------------------------- Physical Exam Details Patient Name: LAREE, GARRON. Date of Service: 01/15/2022 11:15 AM Medical Record Number: [id number] Patient Account Number: [id number] Date of Birth/Sex: December 16, 1945 (76 y.o. F) Treating RN: Donnamarie Poag Primary Care Provider: [name] Other Clinician: Referring Provider: [name] Treating Provider/Extender: Yaakov Guthrie in Treatment: 7 Constitutional . Cardiovascular . Psychiatric . Notes Right lower extremity: Open wound to the posterior and medial aspect with nonviable tissue and scant granulation tissue. No surrounding tissue infection. Electronic Signature(s) Signed: 01/15/2022 11:58:37 AM By: Kalman Shan DO Entered By: Kalman Shan on 01/15/2022 11:55:00 Rossman, Amber Child ([id number]) -------------------------------------------------------------------------------- Physician Orders Details Patient Name: AVAREY, YAEGER. Date of Service: 01/15/2022 11:15 AM Medical Record Number: [id number] Patient Account Number: [id number] Date of Birth/Sex: April 19, 1946 (76 y.o. F) Treating RN: Donnamarie Poag Primary Care Provider: [name] Other Clinician: Referring Provider: [name] Treating Provider/Extender: Yaakov Guthrie in Treatment: 7 Verbal / Phone Orders: No Diagnosis Coding Follow-up Appointments o Return Appointment in 1 week. o Nurse Visit as needed Bathing/ Shower/ Hygiene o May shower with wound dressing protected with water repellent cover or cast protector. o No tub bath. Anesthetic (Use 'Patient Medications' Section for Anesthetic Order Entry) o Lidocaine applied to wound  bed Edema Control - Lymphedema / Segmental Compressive Device / Other o Optional: One layer of unna paste to top of compression wrap (to act as an anchor). o Elevate, Exercise Daily and Avoid Standing for Long Periods of Time. o Elevate leg(s) parallel to the floor when sitting. o DO YOUR BEST to sleep in the bed at night. DO NOT sleep in your recliner. Long hours of sitting in a recliner leads to swelling of the legs and/or potential wounds on your backside. Wound Treatment Wound #15 - Lower Leg Wound Laterality: Right, Posterior Cleanser: Soap and Water 1 x Per Week/15 Days Discharge Instructions: Gently cleanse wound with antibacterial soap, rinse and pat dry prior to dressing wounds Peri-Wound Care: Triamcinolone Acetonide Cream, 0.1%, 15 (g) tube 1 x Per Week/15 Days Discharge Instructions: PERI WOUND-Apply as directed. Topical: Gentamicin 1 x Per Week/15 Days Discharge Instructions: tO WOUND-Apply as directed by provider. Primary Dressing: Hydrofera Blue Ready Transfer Foam, 4x5 (in/in) 1 x Per Week/15 Days Discharge Instructions: Apply Hydrofera Blue Ready to wound bed as directed Secondary Dressing: Xtrasorb Large 6x9 (in/in) 1 x Per Week/15 Days Discharge Instructions: Apply to wound as directed. Do not cut. Compression Wrap: 3-LAYER WRAP - Profore Lite LF 3 Multilayer Compression Bandaging System 1 x Per Week/15 Days Discharge Instructions: Apply 3 multi-layer wrap as prescribed. Wound #16 - Lower Leg Wound Laterality: Right, Medial Cleanser: Soap and Water 1 x Per Week/15 Days Discharge Instructions: Gently cleanse wound with antibacterial soap, rinse and pat dry prior to dressing wounds Peri-Wound Care: Triamcinolone Acetonide Cream, 0.1%, 15 (g) tube 1 x Per Week/15 Days Discharge Instructions: PERI WOUND-Apply as directed. Topical: Gentamicin 1 x  Per Week/15 Days Discharge Instructions: tO WOUND-Apply as directed by provider. Primary Dressing: Hydrofera Blue Ready  Transfer Foam, 4x5 (in/in) 1 x Per Week/15 Days Discharge Instructions: Apply Hydrofera Blue Ready to wound bed as directed Secondary Dressing: Xtrasorb Large 6x9 (in/in) 1 x Per Week/15 Days Discharge Instructions: Apply to wound as directed. Do not cut. Compression Wrap: 3-LAYER WRAP - Profore Lite LF 3 Multilayer Compression Bandaging System 1 x Per Week/15 Days ELLAMAY, FORS ([id number]) Discharge Instructions: Apply 3 multi-layer wrap as prescribed. Electronic Signature(s) Signed: 01/15/2022 11:58:37 AM By: Kalman Shan DO Entered By: Kalman Shan on 01/15/2022 11:57:32 Bayona, Amber Child ([id number]) -------------------------------------------------------------------------------- Problem List Details Patient Name: TEMPESTT, SILBA. Date of Service: 01/15/2022 11:15 AM Medical Record Number: [id number] Patient Account Number: [id number] Date of Birth/Sex: 1946-01-30 (76 y.o. F) Treating RN: Donnamarie Poag Primary Care Provider: [name] Other Clinician: Referring Provider: [name] Treating Provider/Extender: Yaakov Guthrie in Treatment: 7 Active Problems ICD-10 Encounter Code Description Active Date MDM Diagnosis I89.0 Lymphedema, not elsewhere classified 11/27/2021 No Yes I87.331 Chronic venous hypertension (idiopathic) with ulcer and inflammation of 11/27/2021 No Yes right lower extremity L97.811 Non-pressure chronic ulcer of other part of right lower leg limited to 11/27/2021 No Yes breakdown of skin Inactive Problems Resolved Problems Electronic Signature(s) Signed: 01/15/2022 11:58:37 AM By: Kalman Shan DO Entered By: Kalman Shan on 01/15/2022 11:53:32 Fiser, Amber Child ([id number]) -------------------------------------------------------------------------------- Progress Note Details Patient Name: KATRENA, STEHLIN. Date of Service: 01/15/2022 11:15 AM Medical Record Number: [id number] Patient Account Number: [id number] Date of Birth/Sex:  Dec 21, 1945 (76 y.o. F) Treating RN: Donnamarie Poag Primary Care Provider: [name] Other Clinician: Referring Provider: [name] Treating Provider/Extender: Yaakov Guthrie in Treatment: 7 Subjective Chief Complaint Information obtained from Patient Right LE Ulcer History of Present Illness (HPI) Pleasant 76 year old with history of chronic venous insufficiency. No diabetes or peripheral vascular disease. Left ABI 1.29. Questionable history of left lower extremity DVT. She developed a recurrent ulceration on her left lateral calf in December 2015, which she attributes to poor diet and subsequent lower extremity edema. She underwent endovenous laser ablation of her left greater saphenous vein in 2010. She underwent laser ablation of accessory branch of left GSV in April 2016 by Dr. Kellie Simmering at [facility]. She was previously wearing Unna boots, which she tolerated well. Tolerating 2 layer compression and cadexomer iodine. She returns to clinic for follow-up and is without new complaints. She denies any significant pain at this time. She reports persistent pain with pressure. No claudication or ischemic rest pain. No fever or chills. No drainage. READMISSION 11/13/16; this is a 76 year old woman who is not a diabetic. She is here for a review of a painful area on her left medial lower extremity. I note that she was seen here previously last year for wound I believe to be in the same area. At that time she had undergone previously a left greater saphenous vein ablation by Dr. Kellie Simmering and she had a ablation of the anterior accessory branch of the left greater saphenous vein in March 2016. Seeing that the wound actually closed over. In reviewing the history with her today the ulcer in this area has been recurrent. She describes a biopsy of this area in 2009 that only showed stasis physiology. 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 Do not see that she has ever had arterial studies in the left leg. She tells me she has a follow-up with Dr. Kellie Simmering in roughly 10 days In any case she developed the reopening of this area roughly a month ago. On the background of this she describes rapidly increasing edema which has responded to Lasix 40 mg and metolazone 2.5 mg as well as the patient's lymph massage. She has been told she has both venous insufficiency and lymphedema but she cannot tolerate compression stockings 11/28/16; the patient saw Dr. Kellie Simmering recently. Per the patient he did arterial Dopplers in the office that did not show evidence of arterial insufficiency, per the patient he stated "treat this like an ordinary venous ulcer". She also saw her dermatologist Dr. Ronnald Ramp who felt that this was more of a vascular ulcer. In general things are improving although she arrives today with increasing bilateral lower extremity edema with weeping a deeper fluid through the wound on the left medial leg compatible with some degree of lymphedema 12/04/16; the patient's wound is fully epithelialized but I don't think fully healed. We will do another week of depression with Promogran and TCA however I suspect we'll be able to discharge her next week. This is a very unusual-looking wound which was initially a figure-of-eight type wound lying on its side surrounded by petechial like hemorrhage. She has had venous ablation on this side. She apparently does not have an arterial issue per Dr. Kellie Simmering. She saw her dermatologist thought it was "vascular". Patient is definitely going to need ongoing compression and I talked about this with her today she will go to elastic therapy after she leaves here next week 12/11/16; the patient's wound is not completely closed today. She has  surrounding scar tissue and in further discussion with the patient it would appear that she had ulcers in this area in 2009 for a prolonged period of time ultimately requiring a punch biopsy of this area that only showed venous insufficiency. I did not previously pickup on this part of the history from the patient. 12/18/16; the patient's wound is completely epithelialized. There is no open area here. She has significant bilateral venous insufficiency with secondary lymphedema to a mild-to-moderate degree she does not have compression stockings.. She did not say anything to me when I was in the room, she told our intake nurse that she was still having pain in this area. This isn't unusual recurrent small open area. She is going to go to elastic therapy to obtain compression stockings. 12/25/16; the patient's wound is fully epithelialized. There is no open area here. The patient describes some continued episodic discomfort in this area medial left calf. However everything looks fine and healed here. She is been to elastic therapy and caught herself 15-20 mmHg stockings, they apparently were having trouble getting 20-30 mm stockings in her size 01/22/17; this is a patient we discharged from the clinic a month ago. She has a recurrent open wound on her medial left calf. She had 15 mm support stockings. I told her I thought she needed 20-30 mm compression stockings. She tells me that she has been ill with hospitalization secondary to asthma and is been found to have severe hypokalemia likely secondary to a combination of Lasix and metolazone. This morning she noted blistering and leaking fluid on the posterior part of her left leg. She called our intake nurse urgently and we was saw her this afternoon. She  has not had any real discomfort here. I don't know that she's been wearing any stockings on this leg for at least 2-3 days. ABIs in this clinic were 1.21 on the right and 1.3 on the left. She is previously  seen vascular surgery who does not think that there is a peripheral arterial issue. 01/30/17; Patient arrives with no open wound on the left leg. She has been to elastic therapy and obtained 20-5mhg below knee stockings and she has one on the right leg today. READMISSION 02/19/18; this [place] now 76year old patient we've had in this clinic perhaps 3 times before. I had last looked at her from January 07 December 2016 with an area on the medial left leg. We discharged her on 12/25/16 however she had to be readmitted on 01/22/17 with a recurrence. I have in my notes that we discharged her on 20-30 mm stockings although she tells me she was only wearing support hose because she cannot get stockings on predominantly related to her cervical spine surgery/issues. She has [place] the left with an anterior accessory branch ablation I think both of these were in 2016. On one of the previous visit she had a biopsy noted 2009 that was negative. She is not felt to have an arterial issue. She is not a diabetic. She does have a history of obstructive sleep apnea hypertension asthma as well as chronic venous insufficiency and lymphedema. PTAKODA, SIEDLECKI([id number] On this occasion she noted 2 dry scaly patch on her left leg. She tried to put lotion on this it didn't really help. There were 2 open areas.the patient has been seeing her primary physician from 02/05/18 through 02/14/18. She had Unna boots applied. The superior wound now on the lateral left leg has closed but she's had one wound that remains open on the lateral left leg. This is not the same spot as we dealt with in 2018. ABIs in this clinic were 1.3 bilaterally 02/26/18; patient has a small wound on the left lateral calf. Dimensions are down. She has chronic venous insufficiency and lymphedema. 03/05/18; small open area on the left lateral calf.  Dimensions are down. Tightly adherent necrotic debris over the surface of the wound which was difficult to remove. Also the dressing [over collagen] stuck to the wound surface. This was removed with some difficulty as well. Change the primary dressing to Hydrofera Blue ready 03/12/18; small open area on the left lateral calf. Comes in with tightly adherent surface eschar as well as some adherent Hydrofera Blue. 03/19/18; open area on the left lateral calf. Again adherent surface eschar as well as some adherent Hydrofera Blue nonviable subcutaneous tissue. She complained of pain all week even with the reduction from 4-3 layer compression I put on last week. Also she had an increase in her ankle and calf measurements probably related to the same thing. 03/26/18; open area on the left lateral calf. A very small open area remains here. We used silver alginate starting last week as the Hydrofera Blue seem to stick to the wound bed. In using 4-layer compression 04/02/18; the open area in the left lateral calf at some adherent slough which I removed there is no open area here. We are able to transition her into her own compression stocking. Truthfully I think this is probably his support hose. However this does not maintain skin integrity will be limited. She cannot put  over the toe compression stockings on because of neck problems hand problems etc. She is allergic to the lining layer of juxta lites. We might be forced to use extremitease stocking should this fail READMIT 11/24/2018 Patient is now a 76 year old woman who is not a diabetic. She has been in this clinic on at least 3 previous occasions largely with recurrent wounds on her left leg secondary to chronic venous insufficiency with secondary lymphedema. Her situation is complicated by inability to get stockings on and an allergy to neoprene which is apparently a component and at least juxta lites and other stockings. As a result she really has not  been wearing any stockings on her legs. She tells [place] that roughly 2 or 3 weeks ago she started noticing a stinging sensation just above her ankle on the left medial aspect. She has been diagnosed with pseudogout and she wondered whether this was what she was experiencing. She tried to dress this with something she bought at the store however subsequently it pulled skin off and now she has an open wound that is not improving. She has been using Vaseline gauze with a cover bandage. She saw her primary doctor last week who put an Haematologist on her. ABIs in this clinic was 1.03 on the left 2/12; the area is on the left medial ankle. Odd-looking wound with what looks to be surface epithelialization but a multitude of small petechial openings. This clearly not closed yet. We have been using silver alginate under 3 layer compression with TCA 2/19; the wound area did not look quite as good this week. Necrotic debris over the majority of the wound surface which required debridement. She continues to have a multitude of what looked to be small petechial openings. She reminds [place] that she had a biopsy on this initially during her first outbreak in 2015 in [place] [facility]. She expresses concern about this being a possible melanoma. She apparently had a nodular melanoma up on her shoulder that was treated with excision, lymph node removal and ultimately radiation. I assured her that this does not look anything like melanoma. Except for the petechial reaction it does look like a venous insufficiency area and she certainly has evidence of this on both sides 2/26; a difficult area on the left medial ankle. The patient clearly has chronic venous hypertension with some degree of lymphedema. The odd thing about the area is the small petechial hemorrhages. I am not really sure how to explain this. This was present last time and this is not a compression injury. We have been using Hydrofera Blue which I changed to  last week 3/4; still using Hydrofera Blue. Aggressive debridement today. She does not have known arterial issues. She has seen Dr. Kellie Simmering at [facility] vein [facility] vascular and and has an ablation on the left. [Anterior accessory branch of the greater saphenous]. From what I remember they did not feel she had an arterial issue. The patient has had this area biopsied in 2009 at [facility] dermatology and by her recollection they said this was "stasis". She is also follow-up with dermatology locally who thought that this was more of a vascular issue 3/11; using Hydrofera Blue. Aggressive debridement today. She does not have an arterial issue. We are using 3 layer compression although we may need to go to 4. The patient has been in for multiple changes to her wrap since I last saw her a week ago. She says that the area was leaking. I do not have  too much more information on what was found 01/19/19 on evaluation today patient was actually being seen for a nurse visit when unfortunately she had the area on her left lateral lower extremity as well as weeping from the right lower extremity that became apparent. Therefore we did end up actually seeing her for a full visit with myself. She is having some pain at this site as well but fortunately nothing too significant at this point. No fevers, chills, nausea, or vomiting noted at this time. 3/18-Patient is back to the clinic with the left leg venous leg ulcer, the ulcer is larger in size, has a surface that is densely adherent with fibrinous tissue, the Hydrofera Blue was used but is densely adherent and there was difficulty in removing it. The right lower extremity was also wrapped for weeping edema. Patient has a new area over the left lateral foot above the malleolus that is small and appears to have no debris with intact surrounding skin. Patient is on increased dose of Lasix also as a means to edema management 3/25; the patient has a nonhealing venous ulcer  on the medial left leg and last week developed a smaller area on the lateral left calf. We have been using Hydrofera Blue with a contact layer. 4/1; no major change in these wounds areas. Left medial and more recently left lateral calf. I tried Iodoflex last week to aid in debridement she did not tolerate this. She stated her pain was terrible all week. She took the top layer of the 4 layer compression off. 4/8; the patient actually looks somewhat better in terms of her more prominent left lateral calf wound. There is some healthy looking tissue here. She is still complaining of a lot of discomfort. 4/15; patient in a lot of pain secondary to sciatica. She is on a prednisone taper prescribed by her primary physician. She has the 2 areas one on the left medial and more recently a smaller area on the left lateral calf. Both of these just above the malleoli 4/22; her back pain is better but she still states she is very uncomfortable and now feels she is intolerant to the [employer]. No real change in the wounds we have been using Sorbact. She has been previously intolerant to Iodoflex. There is not a lot of option about what we can use to debride this wound under compression that she no doubt needs. sHe states Ultram no longer works for her pain 4/29; no major change in the wounds slightly increased depth. Surface on the original medial wound perhaps somewhat improved however the more recent area on the lateral left ankle is 100% covered in very adherent debris we have been using Sorbact. She tolerates 4 layer compression well and her edema control is a lot better. She has not had to come in for a nurse check 5/6; no major change in the condition of the wounds. She did consent to debridement today which was done with some difficulty. Continuing Sorbact. She did not tolerate Iodoflex. She was in for a check of her compression the day after we wrapped her last week this was adjusted but nothing much was  found 5/13; no major change in the condition or area of the wounds. I was able to get a fairly aggressive debridement done on the lateral left leg [place], [place] ([id number]) wound. Even using Sorbact under compression. She came back in on Friday to have the wrap changed. She says she felt uncomfortable on the lateral  aspect of her ankle. She has a long history of chronic venous insufficiency including previous ablation surgery on this side. 5/20-Patient returns for wounds on left leg with both wounds covered in slough, with the lateral leg wound larger in size, she has been in 3 layer compression and felt more comfortable, she describes pain in ankle, in leg and pins and needles in foot, and is about to try Pamelor for this 6/3; wounds on the left lateral and left medial leg. The area medially which is the most recent of the 2 seems to have had the largest increase in dimensions. We have been using Sorbac to try and debride the surface. She has been to see orthopedics they apparently did a plain x-ray that was indeterminant. Diagnosed her with neuropathy and they have ordered an MRI to determine if there is underlying osteomyelitis. This was not high on my thought list but I suppose it is prudent. We have advised her to make an appointment with vein and vascular in [place]. She has a history of a left greater saphenous and accessory vein ablations I wonder if there is anything else that can be done from a surgical point of view to help in these difficult refractory wounds. We have previously healed this wound on one occasion but it keeps on reopening [medial side] 6/10; deep tissue culture I did last week I think on the left medial wound showed both moderate E. coli and moderate staph aureus [MSSA]. She is going to require antibiotics and I have chosen Augmentin. We have been using Sorbact and we have made better looking wound surface on both sides but certainly no improvement in wound area. She  was back in last Friday apparently for a dressing changes the wrap was hurting her outer left ankle. She has not managed to get a hold of vein and vascular in [place]. We are going to have to make her that appointment 6/17; patient is tolerating the Augmentin. She had an MRI that I think was ordered by orthopedic surgeon this did not show osteomyelitis or an abscess did suggest cellulitis. We have been using Sorbact to the lateral and medial ankles. We have been trying to arrange a follow-up appointment with vein and vascular in [place] or did her original ablations. We apparently an area sent the request to vein and vascular in [facility] 6/24; patient has completed the Augmentin. We do not yet have a vein and vascular appointment in [place]. I am not sure what the issue is here we have asked her to call tomorrow. We are using Sorbact. Making some improvements and especially the medial wound. Both surfaces however look better medial and lateral. 7/1; the patient has been in contact with vein and vascular in [place] but has not yet received an appointment. Using Sorbact we have gradually improve the wound surface with no improvement in surface area. She is approved for Apligraf but the wound surface still is not completely viable. She has not had to come in for a dressing change 7/8; the patient has an appointment with vein and vascular on 7/31 which is a Friday afternoon. She is concerned about getting back here for [place] to dress her wounds. I think it is important to have them goal for her venous reflux/history of ablations etc. to see if anything else can be done. She apparently tested positive for 1 of the blood tests with regards to lupus and saw a rheumatologist. He has raised the issue of vasculitis again. I have had  this thought in the past however the evidence seems overwhelming that this is a venous reflux etiology. If the rheumatologist tells me there is clinical and laboratory  investigation is positive for lupus I will rethink this. 7/15; the patient's wound surfaces are quite a bit better. The medial area which was her original wound now has no depth although the lateral wound which was the more recent area actually appears larger. Both with viable surfaces which is indeed better. Using Sorbact. I wanted to use Apligraf on her however there is the issue of the vein and vascular appointment on 7/31 at 2:00 in the afternoon which would not allow her to get back to be rewrapped and they would no doubt remove the graft 7/22; the patient's wound surfaces have moderate amount of debris although generally look better. The lateral one is larger with 2 small satellite areas superiorly. We are waiting for her vein and vascular appointment on 7/31. She has been approved for Apligraf which I would like to use after th 7/29; wound surfaces have improved no debridement is required we have been using Sorbact. She sees vein and vascular on Friday with this so question of whether anything can be done to lessen the likelihood of recurrence and/or speed the healing of these areas. She is already had previous ablations. She no doubt has severe venous hypertension 8/5-Patient returns at 1 week, she was in [place] for 3 days by her podiatrist, we have been using so backed to the wound, she has increased pain in both the wounds on the left lower leg especially the more distal one on the lateral aspect 8/12-Patient returns at 1 week and she is agreeable to having debridement in both wounds on her left leg today. We have been using Sorbact, and vascular studies were reviewed at last visit 8/19; the patient arrives with her wounds fairly clean and no debridement is required. We have used Sorbact which is really done a nice job in cleaning up these very difficult wound surfaces. The patient saw Dr. Donzetta Matters of vascular surgery on 7/31. He did not feel that there was an arterial component. He felt  that her treated greater saphenous vein is adequately addressed and that the small saphenous vein did not appear to be involved significantly. She was also noted to have deep venous reflux which is not treatable. Dr. Donzetta Matters mentioned the possibility of a central obstructive component leading to reflux and he offered her central venography. She wanted to discuss this or think about it. I have urged her to go ahead with this. She has had recurrent difficult wounds in these areas which do heal but after months in the clinic. If there is anything that can be done to reduce the likelihood of this I think it is worth it. 9/2 she is still working towards getting follow-up with Dr. Donzetta Matters to schedule her CT. Things are quite a bit worse venography. I put Apligraf on 2 weeks ago on both wounds on the medial and lateral part of her left lower leg. She arrives in clinic today with 3 superficial additional wounds above the area laterally and one below the wound medially. She describes a lot of discomfort. I think these are probably wrapped injuries. Does not look like she has cellulitis. 07/20/2019 on evaluation today patient appears to be doing somewhat poorly in regard to her lower extremity ulcers. She in fact showed signs of erythema in fact we may even be dealing with an infection at this time.  Unfortunately I am unsure if this is just infection or if indeed there may be some allergic reaction that occurred as a result of the Apligraf application. With that being said that would be unusual but nonetheless not impossible in this patient is one who is unfortunately allergic to quite a bit. Currently we have been using the Sorbact which seems to do as well as anything for her. I do think we may want to obtain a culture today to see if there is anything showing up there that may need to be addressed. 9/16; noted that last week the wounds look worse in 1 week follow-up of the Apligraf. Using Sorbact as of 2 days ago.  She arrives with copious amounts of drainage and new skin breakdown on the back of the left calf. The wounds arm more substantial bilaterally. There is a fair amount of swelling in the left calf no overt DVT there is edema present I think in the left greater than right thigh. She is supposed to go on 9/28 for CT venography. The wounds on the medial and lateral calf are worse and she has new skin breakdown posteriorly at least new for me. This is almost developing into a circumferential wound area The Apligraf was taken off last week which I agree with things are not going in the right direction a culture was done we do not have that back yet. She is on Augmentin that she started 2 days ago 9/23; dressing was changed by her nurses on Monday. In general there is no improvement in the wound areas although the area looks less angry than last week. She did get Augmentin for MSSA cultured on the 14th. She still appears to have too much swelling in the left leg even with 3 layer compression 9/30; the patient underwent her procedure on 9/28 by Dr. Donzetta Matters at vascular and vein specialist. She was discovered to have the common iliac vein measuring 12.2 mm but at the level of L4-L5 measured 3 mm. After stenting it measured 10 mm. 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 Rouleaux flow in the common femoral and femoral vein was observed much improved after stenting. We are using silver alginate to the wounds on the medial and lateral ankle on the left. 4 layer compression 10/7; the patient had fluid swelling around her knee and 4 layer compression. At the advice of vein and vascular this was reduced to 3 layer which she is tolerating better. We have been using silver alginate under 3 layer compression since last Friday [name], Amber ([id number]) 10/14; arrives with the areas on the left ankle looking a lot better. Inflammation in the area also a lot better. She came in for a nurse check  on 10/9 10/21; continued nice improvement. Slight improvements in surface area of both the medial and lateral wounds on the left. A lot of the satellite lesions in the weeping erythema around these from stasis dermatitis is resolved. We have been using silver alginate 10/28; general improvement in the entire wound areas although not a lot of change in dimensions the wound certainly looks better. There is a lot less in terms of venous inflammation. Continue silver alginate this week however look towards Hydrofera Blue next week 11/4; very adherent debris on the medial wound left wound is not as bad. We have been using silver alginate. Change to [facility] today 11/11; very adherent debris on both wound areas. She went to vein and vascular last week and  follow-up they put in [place] on this today. He says the [facility] was adherent. Wound is definitely not as good as last week. Especially on the left there the satellite lesions look more prominent 11/18; absolutely no better. erythema on lateral aspect with tenderness. 09/30/2019 on evaluation today patient appears to actually be doing better. Dr. Dellia Nims did put her on doxycycline last week which I do believe has helped her at this point. Fortunately there is no signs of active infection at this time. No fevers, chills, nausea, vomiting, or diarrhea. I do believe he may want extend the doxycycline for 7 additional days just to ensure everything does completely cleared up the patient is in agreement with that plan. Otherwise she is going require some sharp debridement today 12/2; patient is completing a 2-week course of doxycycline. I gave her this empirically for inflammation as well as infection when I last saw her 2 weeks ago. All of this seems to be better. She is using silver alginate she has the area on the medial aspect of the larger area laterally and the 2 small satellite regions laterally above the major wound. 12/9; the patient's  wound on the left medial and left lateral calf look really quite good. We have been using silver alginate. She saw vein and vascular in follow-up on 10/09/2019. She has had a previous left greater saphenous vein ablation by Dr. Oscar La in 2016. More recently she underwent a left common iliac vein stent by Dr. 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 The swelling is improved and certainly the wounds have improved. The patient shows [place] today area on the right medial calf there is almost no wound but leaking lymphedema. She says she start this started 3 or 4 days ago. She did not traumatize it. It is not painful. She does not wear compression on that side 12/16; the patient continues to do well laterally. Medially still requiring debridement. The area on the right calf did not materialize to anything and is not currently open. We wrapped this last time. She has support stockings for that leg although I am not sure they are going to provide adequate compression 12/23; the lateral wound looks stable. Medially still requiring debridement for tightly adherent fibrinous debris. We've been using silver alginate. Surface area not any different 12/30; neither wound is any better with regards to surface and the area on the left lateral is larger. I been using silver alginate to the left lateral which look quite good last week and Sorbact to the left medial 11/11/2019. Lateral wound area actually looks better and somewhat smaller. Medial still requires a very aggressive debridement today. We have been using Sorbact on both wound areas 1/13; not much better still adherent debris bilaterally. I been using Sorbact. She has severe venous hypertension. Probably some degree of dermal fibrosis distally. I wonder whether tighter compression might help and I am going to try that today. We also need to work on the bioburden 1/20; using Sorbact. She has severe venous hypertension status post stent placement for  pelvic vein compression. We applied gentamicin last time to see if we could reduce bioburden I had some discussion with her today about the use of pentoxifylline. This is occasionally used in this setting for wounds with refractory venous insufficiency. However this interacts with Plavix. She tells me that she was put on this after stent placement for 3 months. She will call Dr. Claretha Cooper office to discuss 1/27; we are using gentamicin under  Sorbact. 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 She has a stent. Wound medially is measuring smaller this week. Laterally measuring slightly larger although she has some satellite lesions superiorly 2/3; gentamicin under Sorbact under 4-layer compression. 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 She has a stent on Plavix. Her wounds are measuring smaller this week. More substantially laterally where there is a satellite lesion superiorly. 2/10; gentamicin under Sorbac. 4-layer compression. Patient communicated with Dr. Donzetta Matters at vein and vascular in [place]. He is okay with the patient coming off Plavix I will therefore start her on pentoxifylline for a 1 month trial. In general her wounds look better today. I had some concerns about swelling in the left thigh however she measures 61.5 on the right and 63 on the mid thigh which does not suggest there is any difficulty. The patient is not describing any pain. 2/17; gentamicin under Sorbac 4-layer compression. She has been on pentoxifylline for 1 week and complains of loose stool. No nausea she is eating and drinking well 2/24; the patient apparently came in 2 days ago for a nurse visit when her wrap fell down. Both areas look a little worse this week macerated medially and satellite lesions laterally. Change to silver alginate today 3/3; wounds are larger today especially medially. She also has more swelling in her foot lower leg and I even noted some  swelling in her posterior thigh which is tender. I wonder about the patency of her stent. Fortuitously she sees Dr. Claretha Cooper group on Friday 3/10; Mrs. Gavilanes was seen by vein and vascular on 3/5. The patient underwent ultrasound. There was no evidence of thrombosis involving the IVC no evidence of thrombosis involving the right common iliac vein there is no evidence of thrombosis involving the right external iliac vein the left external vein is also patent. The right common iliac vein stent appears patent bilateral common femoral veins are compressible and appear patent. I was concerned about the left common iliac stent however it looks like this is functional. She has some edema in the posterior thigh that was tender she still has that this week. I also note they had trouble finding the pulses in her left foot and booked her for an ABI baseline in 4 weeks. She will follow up in 6 months for repeat IVC duplex. The patient stopped the pentoxifylline because of diarrhea. It does not look like that was being effective in any case. I have advised her to go back on her aspirin 81 mg tablet, vascular it also suggested this 3/17; comes in today with her wound surfaces a lot better. The excoriations from last week considerably better probably secondary to the TCA. We have been using silver alginate 3/24; comes in today with smaller wounds both medially and laterally. Both required debridement. There are 2 small satellite areas superiorly laterally. She also has a very odd bandlike area in the mid calf almost looking like there was a weakness in the wrap in a localized area. I would write this off as being this however anteriorly she has a small raised ballotable area that is very tender almost reminiscent of an abscess but there was no obvious purulent surface to it. ARBADELLA, KIMBLER ([id number]) 02/04/20 upon evaluation today patient appears to be doing fairly well in regard to her wounds today. Fortunately  there is no signs of active infection at this time. No fevers, chills, nausea, vomiting, or diarrhea. She has been tolerating the dressing changes without  complication. Fortunately I feel like she is showing signs of improvement although has been sometime since have seen her. Nonetheless the area of concern that Dr. Dellia Nims had last week where she had possibly an area of the wrap that was we can allow the leg to bulge appears to be doing significantly better today there is no signs of anything worsening. 4/7; the patient's wounds on her medial and lateral left leg continue to contract. We have been using a regular alginate. Last week she developed an area on the right medial lower leg which is probably a venous ulcer as well. 4/14; the wounds on her left medial and lateral lower leg continue to contract. Surface eschar. We have been using regular alginate. The area on the right medial lower leg is closed. We have been putting both legs under 4-layer contraction. The patient went back to see vein and vascular she had arterial studies done which were apparently "quite good" per the patient although I have not read their notes I have never felt she had an arterial issue. 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 This is been longstanding and refractory to exercise, leg elevation and longstanding use of compression wraps in our clinic as well as compression stockings on the times we have been able to get these to heal 4/21; we thought she actually might be close this week however she arrives in clinic with a lot of edema in her upper left calf and into her posterior thigh. This is been an intermittent problem here. She says the wrap fell down but it was replaced with a nurse visit on Monday. We are using calcium alginate to the wounds and the wound sizes there not terribly larger than last week but there is a lot more edema 4/28; again wound edges are smaller  on both sides. Her edema is better controlled than last time. She is obtained her compression pumps from [employer] although they have not been to her home to set these up. 5/5; left medial and left lateral both look stable. I am not sure the medial is any smaller. We have been using calcium alginate under 4-layer compression. She had an area on the right medial. This was eschared today. We have been wrapping this as well. She does not tolerate external compression stockings due to a history of various contact allergies. She has her compression pumps however the representative from the company is coming on her to show her how to use these tomorrow 5/19; patient with severe chronic venous insufficiency secondary to central venous disease. She had a stent placed in her left common iliac vein. She has done better since but still difficult to control wounds. She comes in today with nothing open on the right leg. Her areas on the left medial and left lateral are just about closed. We are using calcium alginate under 4-layer compression. She is using her external compression pumps at home She only has 15-20 support stockings. States she cannot get anything tighter than that on. 03/30/20-Patient returns at 1 week, the wounds on the left leg are both slightly bigger, the last week she was on 3 layer compression which started to slide down. She is starting to use her lymphedema pumps although she stated on 1 day her right ankle started to swell up and she have to stop that day. Unfortunately the open area seem to oscillate between improving to the point of healing and then flaring up all to do with  effectiveness of compression or lack of due to the left leg topography not keeping the compression wraps from rolling down 6/2 patient comes in with a 15/20 mmHg stocking on the right leg. She tells me that she developed a lot of swelling in her ankles she saw orthopedics she was felt to possibly be having  a flare of pseudogout versus some other type of arthritis. She was put on steroids for a respiratory issue so that helps with the inflammation. She has not been using the pumps all week. She thinks the left thigh is more swollen than usual and I would agree with that. She has an appointment with Dr. Donzetta Matters 9 days or so from now 6/9; both wounds on the left medial and left lateral are smaller. We have been using calcium alginate under compression. She does not have an open wound on the right leg she is using a stocking and her compression pumps things are going well. She has an appointment with Dr. Donzetta Matters with regards to her stent in the left common iliac vein 6/16; the wounds on the left medial and left lateral ankle continues to contract. The patient saw Dr. Donzetta Matters and I think he seems satisfied. Ordered follow-up venous reflux studies on both sides in September. Cautioned that she may need thigh-high stockings. She has been using calcium alginate under compression on the left and her own stocking on the right leg. She tells [place] there are no open wounds on the right 6/23; left lateral is just about closed. Medial required debridement today. We have been using calcium alginate. Extensive discussion about the compression pumps she is only using these on 25 mmHg states she could not take 40 or 30 when the wrap came out to her home to demonstrate these. He said they should not feel tight 6/30; the left lateral wound has a slight amount of eschar. . The area medially is about the same using Hydrofera Blue. 7/7; left lateral wound still has some eschar. I will remove this next week may be closed. The area medially is very small using Hydrofera Blue with improvement. Unfortunately the stockings fell down. Unfortunately the blisters have developed at the edge of where the wrap fell. When this happened she says her legs hurt she did not use her pumps. We are not open Monday for her to come in and change the wraps  and she had an appointment yesterday. She also tells me that she is going to have an MRI of her back. She is having pain radiating into her left anterior leg she thinks her from an L5 disc. She saw Dr. Ellene Route of neurosurgery 7/14; the area on the left lateral ankle area is closed. Still a small area medially however it looks better as well. We have been using Hydrofera Blue under 4-layer compression 7/21; left lateral ankle is still closed however her wound on the medial left calf is actually larger. This is probably because Hydrofera Blue got stuck to the wound. She came in for a nurse change on Friday and will do that again this week I was concerned about the amount of swelling that she had last week however she is using her compression pumps twice a day and the swelling seems well controlled 7/28; remaining wound on the left medial lower leg is smaller. We have been using moistened silver collagen under compression she is coming back for a nurse visit. For reasons that were not really clear she was just keeping her legs elevated and  not using her compression pumps. I have asked her to use the compression pumps. She does not have any wounds on the right leg 06/15/20-Patient returns at 2 weeks, her LLE edema is worse and she developed a blister wound that is new and has bigger posterior calf wound on right, we are using Prisma with pad, 4 layer compression. she has been on lasix 40 mg daily 8/18; patient arrives today with things a lot worse than I remember from a few weeks ago. She was seen last week. Noted that her edema was worse and that she now had a left lateral wound as well as deteriorating edema in the medial and posterior part of the lower leg. She says she is using his or her external compression pumps once a day although I wonder about the compliance. 8/25; weeping area on the right medial lower leg. This had actually gotten a small localized area of her compression stocking wet. On the  left side there is a large denuded area on the posterior medial lower leg and smaller area on the lateral. This was not the original areas that we dealt with. 9/1 the patient's wound on the left leg include the left lateral and left posterior. Larger superficial wounds weeping. She has very poor edema control. Tender localized edema in the left lower medial ankle/heel probably because of localized wrap issues. She freely admits she is not using the compression pumps. She has been up on her feet a lot. She thinks the hydrofera blue is contributing to the pain she is experiencing.. This is a complaint that I have occasionally heard [name], Amber ([id number]) 9/8; really not much improvement. The patient is still complaining of a lot of pain particularly when she uses compression pumps. I switched her to silver alginate last time because she found the Hydrofera Blue to be irritating. I don't hear much difference in her description with the silver alginate. 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 She has follow-up with Dr. Donzetta Matters I think for ultrasound next month 9/15; quite a bit of improvement today. We have less edema and more epithelialization in both of her wound areas on the left medial and left lateral calf. These are not the site of her original wounds in this area. She says she has been using her compression pumps for 30 minutes twice a day, there pain issues that never quite understood. Silver alginate as the primary dressing 9/22; continued improvement. Both areas medially and laterally still have a small open area there is some eschar. She continues to complain of left medial ankle pain. Swelling in the leg is in much better condition. We have been using silver alginate 9/29; continued improvement. Both areas medially and laterally in the left calf look as though they are close some minor surface eschar but  I think this is epithelialized. She comes in today saying she has a ruptured disc at L4-L5 cannot bend over to put on her stockings. 10/6; patient comes in today with no open wounds on either leg. However her edema on the left leg in the upper one third of the lower leg is poorly controlled nonpitting. She says that she could not use the pumps for 2 days and then she has been using the last couple of days. It is not clear to me she has been able to get her stocking on. She has back problems. Mrs. Ferdinand Lango  has severe chronic venous insufficiency with secondary lymphedema. Her venous insufficiency is partially centrally mediated and that she is now post stent in the left common iliac vein. [facility] Thurner's syndrome/physiology]. She follows up with them on 10/15. She wears 20/30 below-knee stockings. She is supposed to use compression pumps at home although I think her compliance about with this is been less than 100%. I have asked her to use these 3 times a day. Finally I think she has lipodermatosclerosis in the left lower leg with an inverted bottle sign. It is been a major problem controlling the edema in the left leg. The right leg we have had wounds on but not as significant a problem is on the left READMISSION 04/12/2021 Mrs. Inthavong is a 76 year old woman we know well in this clinic. She has severe chronic venous insufficiency. 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 I believe she has had bilateral greater saphenous vein ablation in the past as well. She tells me that this wound opened sometime in March. She had a fall and thinks it was initially abrasion. She developed areas she describes as little blisters on the anterior part of her leg and she saw dermatology and was treated for methicillin staph aureus with several rounds of antibiotics. She has been using support stockings on the left leg and says this is the only thing she can get on. Her compression  pump use maybe once a day she says if she did not use one she use the other. She comes in today with incredible swelling in the left leg with a wound on the left posterior calf. She has been using Neosporin to this previously a hydrocolloid. 6/15; patient arrives back for 1 week follow-up.Marland Kitchen Apparently her wrap fell down she did not call us to replace this. He has poor edema control. She only uses her compression pumps once a day 6/29; patient presents for 1 week follow-up. She has tolerated the compression wrap well. We have been using Iodoflex under the wrap. She has no issues or complaints today. 7/6; patient presents for 1 week follow-up. She states that the compression wrap rolled down her leg 4 days ago. She has been trying to keep the area covered but has no dressings at home to use. She denies signs of infection. 7/13; patient presents for 1 week follow-up. She states that her compression wrap rolled down her right leg and she called our office and had it placed a few days ago. She has been tolerating the current wrap well. She states that the Iodoflex is causing a burning sensation. She denies signs of infection. 7/27; patient presents for 1 week follow-up She has tolerated the compression wrap well with Hydrofera Blue underneath. She has now developed a wound to her right lower extremity. She reports having a culture done To this area by her dermatologist that she reports is negative. She currently denies signs of infection. 8/30; patient presents for 1 week follow-up. On the left side she has tolerated the compression wrap well with Hydrofera Blue underneath. She reports some discomfort to her right lower extremity with the 3 layer compression. She currently denies signs of infection. 06/14/2021 upon evaluation today patient's wound actually appears to be doing decently well based on what I am seeing currently. She actually has 2 areas 1 on the left distal/posterior lower leg and the other  on the right lower leg. Subsequently the measurements are roughly the same may be  slightly smaller but in general have not made any trend towards getting worse which is great news. 06/23/2021 upon evaluation today patient appears to be doing pretty well in regard to her wounds. On the right she is having difficulty with sciatic pain and this subsequently has led to her taking the wrap off over the last week her leg is more swollen she is also been on prednisone for 10 days. With that being said I think we may want to just use a compression sock on the left that way she will not have to worry about the compression wrap being in place that she cannot get off. With that being said I do believe as well that the patient is going to need to continue with the wrapping on the left we will also need to do a little bit of sharp debridement today. 8/24; patient presents for 1 week follow-up. She has no issues or complaints today. She denies signs of infection. 8/31; patient presents for 1 week follow-up. She has no issues or complaints today. She has tolerated the compression wrap well on the left lower extremity. She denies signs of infection. 9/7; patient presents for follow-up. She reports that the compression wrap rolled down slightly on her right lower extremity. She reports tenderness to the wound bed. She denies increased warmth or erythema to the surrounding skin. She overall feels well. 9/14; patient presents for follow-up. She has no issues or complaints today. She denies signs of infection. PuraPly is available and patient would like to start this today. 9/21; patient presents for follow-up. She has no issues or complaints today. She tolerated the first skin substitute placement last week under compression. 9/28; patient presents for 1 week follow-up. She has no issues or complaints today. 10/5; patient presents for 1 week follow-up. She reports taking the wrap off yesterday. She was on her feet more  yesterday and developed more swelling to her left lower extremity. She denies pain. Overall she is doing well. [name], STACH ([id number]) 10/12; patient presents for 1 week follow-up. She has no issues or complaints today. 10/19; patient presents for follow-up. She has no issues or complaints today. She tolerated the compression wrap well. She states she has compression stockings at home. Readmission: 11/27/2021 upon evaluation today patient appears to be doing poorly in regard to the wounds on her right leg. She also has an area of erythema in the proximal calf area of the right leg which again I do believe is a issue from the standpoint of it being warm and erythematous to touch and also seems to be somewhat localized I really think this may be more of a cellulitis issue than anything else. Fortunately I do not see any signs of active infection Systemically at this time. 12/05/2021 upon evaluation patient's wounds actually appear to be showing some signs of improvement which is not too bad and just 1 week's time. Especially since we had to get on the right antibiotic which is only been for the past 4 days. Fortunately I do not see any signs of active infection locally nor systemically at this time that has gotten any worse. Overall I think that again with the antibiotic she is significantly improved and currently she is taking Levaquin. 12/11/2021 upon evaluation today patient appears to be doing well with regard to her leg. Between the antibiotics and the triamcinolone I feel like she is doing significantly better which is great news. Overall I am extremely pleased with where we stand. There  does not appear to be any signs of active infection locally or systemically at this time. 12/18/2021 upon evaluation today patient appears to be doing well with regard to her wound. I do feel like this is showing signs of some improvement here. Little by little this is clearing up quite nicely. I think it is  just a slow process but nonetheless 1 that we are seeing evidence of improvement with regard to. 12/25/2021 upon evaluation today patient appears to be doing well with regard to her wounds she is definitely making some good progress here. Fortunately I do not see any signs of active infection locally or systemically at this time which is great news. No fevers, chills, nausea, vomiting, or diarrhea. 01/02/2022 upon evaluation today patient appears to be doing well with regard to her wound. She has been tolerating the dressing changes without complication. Fortunately I do not see any evidence of active infection locally or systemically which is great news and overall I am extremely pleased with where we stand today. 01/08/2022 upon evaluation today patient appears to be doing better in regard to her ankle ulcers. She has been tolerating the dressing changes without complication. Fortunately I do not see any signs of infection and both the ankle and leg area is making great progress. 3/13; patient presents for follow-up. She has no issues or complaints today. She was tolerated the compression wrap well. Objective Constitutional Vitals Time Taken: 11:25 AM, Height: 62 in, Weight: 199 lbs, BMI: 36.4, Temperature: 98.5 F, Pulse: 92 bpm, Respiratory Rate: 16 breaths/min, Blood Pressure: 193/85 mmHg. General Notes: Right lower extremity: Open wound to the posterior and medial aspect with nonviable tissue and scant granulation tissue. No surrounding tissue infection. Integumentary (Hair, Skin) Wound #15 status is Open. Original cause of wound was Gradually Appeared. The date acquired was: 10/27/2021. The wound has been in treatment 7 weeks. The wound is located on the Right,Posterior Lower Leg. The wound measures 5.6cm length x 6.3cm width x 0.2cm depth; 27.709cm^2 area and 5.542cm^3 volume. There is Fat Layer (Subcutaneous Tissue) exposed. There is no tunneling or undermining noted. There is a large  amount of serosanguineous drainage noted. The wound margin is indistinct and nonvisible. There is medium (34-66%) pink, pale granulation within the wound bed. There is a medium (34-66%) amount of necrotic tissue within the wound bed including Adherent Slough. General Notes: measurement boxed Wound #16 status is Open. Original cause of wound was Gradually Appeared. The date acquired was: 10/26/2021. The wound has been in treatment 3 weeks. The wound is located on the Right,Medial Lower Leg. The wound measures 2cm length x 1.5cm width x 0.3cm depth; 2.356cm^2 area and 0.707cm^3 volume. There is Fat Layer (Subcutaneous Tissue) exposed. There is no tunneling or undermining noted. There is a medium amount of serosanguineous drainage noted. There is small (1-33%) pink, hyper - granulation within the wound bed. There is a large (67-100%) amount of necrotic tissue within the wound bed including Adherent Slough. Assessment LAMIYA, NAAS ([id number]) Active Problems ICD-10 Lymphedema, not elsewhere classified Chronic venous hypertension (idiopathic) with ulcer and inflammation of right lower extremity Non-pressure chronic ulcer of other part of right lower leg limited to breakdown of skin Patient's wounds have declined slightly in appearance and size since last clinic visit. She is using triamcinolone cream under 3 layer compression. She has tenderness on exam and cannot tolerate debridement due to discomfort. At this time I recommended stopping triamcinolone cream to the wound bed and starting Hydrofera Blue and gentamicin (  to address any bioburden). There were no surrounding signs of tissue infection. I recommended continuing with 3 layer compression. Procedures Wound #15 Pre-procedure diagnosis of Wound #15 is a Lymphedema located on the Right,Posterior Lower Leg . There was a Three Layer Compression Therapy Procedure by Donnamarie Poag, RN. Post procedure Diagnosis Wound #15: Same as  Pre-Procedure Plan Follow-up Appointments: Return Appointment in 1 week. Nurse Visit as needed Bathing/ Shower/ Hygiene: May shower with wound dressing protected with water repellent cover or cast protector. No tub bath. Anesthetic (Use 'Patient Medications' Section for Anesthetic Order Entry): Lidocaine applied to wound bed Edema Control - Lymphedema / Segmental Compressive Device / Other: Optional: One layer of unna paste to top of compression wrap (to act as an anchor). Elevate, Exercise Daily and Avoid Standing for Long Periods of Time. Elevate leg(s) parallel to the floor when sitting. DO YOUR BEST to sleep in the bed at night. DO NOT sleep in your recliner. Long hours of sitting in a recliner leads to swelling of the legs and/or potential wounds on your backside. WOUND #15: - Lower Leg Wound Laterality: Right, Posterior Cleanser: Soap and Water 1 x Per Week/15 Days Discharge Instructions: Gently cleanse wound with antibacterial soap, rinse and pat dry prior to dressing wounds Peri-Wound Care: Triamcinolone Acetonide Cream, 0.1%, 15 (g) tube 1 x Per Week/15 Days Discharge Instructions: PERI WOUND-Apply as directed. Topical: Gentamicin 1 x Per Week/15 Days Discharge Instructions: tO WOUND-Apply as directed by provider. Primary Dressing: Hydrofera Blue Ready Transfer Foam, 4x5 (in/in) 1 x Per Week/15 Days Discharge Instructions: Apply Hydrofera Blue Ready to wound bed as directed Secondary Dressing: Xtrasorb Large 6x9 (in/in) 1 x Per Week/15 Days Discharge Instructions: Apply to wound as directed. Do not cut. Compression Wrap: 3-LAYER WRAP - Profore Lite LF 3 Multilayer Compression Bandaging System 1 x Per Week/15 Days Discharge Instructions: Apply 3 multi-layer wrap as prescribed. WOUND #16: - Lower Leg Wound Laterality: Right, Medial Cleanser: Soap and Water 1 x Per Week/15 Days Discharge Instructions: Gently cleanse wound with antibacterial soap, rinse and pat dry prior to  dressing wounds Peri-Wound Care: Triamcinolone Acetonide Cream, 0.1%, 15 (g) tube 1 x Per Week/15 Days Discharge Instructions: PERI WOUND-Apply as directed. Topical: Gentamicin 1 x Per Week/15 Days Discharge Instructions: tO WOUND-Apply as directed by provider. Primary Dressing: Hydrofera Blue Ready Transfer Foam, 4x5 (in/in) 1 x Per Week/15 Days Discharge Instructions: Apply Hydrofera Blue Ready to wound bed as directed Secondary Dressing: Xtrasorb Large 6x9 (in/in) 1 x Per Week/15 Days Discharge Instructions: Apply to wound as directed. Do not cut. Compression Wrap: 3-LAYER WRAP - Profore Lite LF 3 Multilayer Compression Bandaging System 1 x Per Week/15 Days Discharge Instructions: Apply 3 multi-layer wrap as prescribed. ROSHA, COCKER ([id number]) 1. Hydrofera Blue and gentamicin under 3 layer compression 2. Follow-up in 1 week Electronic Signature(s) Signed: 01/15/2022 11:58:37 AM By: Kalman Shan DO Entered By: Kalman Shan on 01/15/2022 11:56:42 Stegeman, Amber Child ([id number]) -------------------------------------------------------------------------------- SuperBill Details Patient Name: MONASIA, LAIR. Date of Service: 01/15/2022 Medical Record Number: [id number] Patient Account Number: [id number] Date of Birth/Sex: Oct 24, 1946 (76 y.o. F) Treating RN: Donnamarie Poag Primary Care Provider: [name] Other Clinician: Referring Provider: [name] Treating Provider/Extender: Yaakov Guthrie in Treatment: 7 Diagnosis Coding ICD-10 Codes Code Description I89.0 Lymphedema, not elsewhere classified I87.331 Chronic venous hypertension (idiopathic) with ulcer and inflammation of right lower extremity L97.811 Non-pressure chronic ulcer of other part of right lower leg limited to breakdown of skin Facility Procedures CPT4 Code: [id number]  Description: Acupuncturist Use Only) [phone] - [facility] LWR RT LEG Modifier: Quantity: 1 Physician Procedures CPT4  Code Description: [id number] - WC PHYS LEVEL 3 - EST PT Modifier: Quantity: 1 CPT4 Code Description: ICD-10 Diagnosis Description I89.0 Lymphedema, not elsewhere classified I87.331 Chronic venous hypertension (idiopathic) with ulcer and inflammation of rig L97.811 Non-pressure chronic ulcer of other part of right lower leg limited  to brea Modifier: ht lower extremit kdown of skin Quantity: y Engineer, maintenance) Signed: 01/15/2022 11:58:37 AM By: Kalman Shan DO Entered By: Kalman Shan on 01/15/2022 11:56:58

## 2022-01-15 NOTE — Progress Notes (Signed)
[name], [name] ([id number]) Visit Report for 01/15/2022 Arrival Information Details Patient Name: [name], [name]. Date of Service: 01/15/2022 11:15 AM Medical Record Number: [id number] Patient Account Number: [id number] Date of Birth/Sex: 03-08-1946 (76 y.o. F) Treating RN: Donnamarie Poag Primary Care Stiven Kaspar: [name] Other Clinician: Referring [name] Dust: [name] Treating Len Kluver/Extender: Yaakov Guthrie in Treatment: 7 Visit Information History Since Last Visit Added or deleted any medications: No Patient Arrived: Ambulatory Had a fall or experienced change in No Arrival Time: 11:22 activities of daily living that may affect Accompanied By: self risk of falls: Transfer Assistance: None Hospitalized since last visit: No Patient Identification Verified: Yes Has Dressing in Place as Prescribed: Yes Secondary Verification Process Completed: Yes Has Compression in Place as Prescribed: Yes Patient Requires Transmission-Based No Pain Present Now: Yes Precautions: Patient Has Alerts: Yes Patient Alerts: Patient on Blood Thinner 47m aspirin Electronic Signature(s) Signed: 01/15/2022 4:06:03 PM By: BDonnamarie PoagEntered By: BDonnamarie Poagon 01/15/2022 11:26:14 [name], [name]([id number] -------------------------------------------------------------------------------- Compression Therapy Details Patient Name: [name] Date of Service: 01/15/2022 11:15 AM Medical Record Number: [id number] Account Number: [id number] of Birth/Sex: 9August 19, 1947(76y.o. F) Treating RN: BDonnamarie PoagPrimary Care Tawnie Ehresman: GRia BushOther Clinician: Referring Kalea Perine: GRia BushTreating Bowman Higbie/Extender: HYaakov Guthriein Treatment: 7 Compression Therapy Performed for Wound Assessment: Wound #15 Right,Posterior Lower Leg Performed By: CJunius Argyle RN Compression Type: Three Layer Post Procedure Diagnosis Same as  Pre-procedure Electronic Signature(s) Signed: 01/15/2022 4:06:03 PM By: BDonnamarie PoagEntered By: BDonnamarie Poagon 01/15/2022 11:39:23 [name]([id number] -------------------------------------------------------------------------------- Encounter Discharge Information Details Patient Name: [name] Date of Service: 01/15/2022 11:15 AM Medical Record Number: [id number] Account Number: [id number] of Birth/Sex: [birth year](76y.o. F) Treating RN: BDonnamarie PoagPrimary Care Timya Trimmer: GRia BushOther Clinician: Referring Romelle Muldoon: GRia BushTreating Aileene Lanum/Extender: HYaakov Guthriein Treatment: 7 Encounter Discharge Information Items Discharge Condition: Stable Ambulatory Status: Ambulatory Discharge Destination: Home Transportation: Private Auto Accompanied By: self Schedule Follow-up Appointment: Yes Clinical Summary of Care: Electronic Signature(s) Signed: 01/15/2022 4:06:03 PM By: BDonnamarie PoagEntered By: BDonnamarie Poagon 01/15/2022 12:01:13 [name]([id number] -------------------------------------------------------------------------------- Lower Extremity Assessment Details Patient Name: [name] Date of Service: 01/15/2022 11:15 AM Medical Record Number: [id number] Account Number: [id number] of Birth/Sex: 9March 02, 1947(76y.o. F) Treating RN: BDonnamarie PoagPrimary Care Sholanda Croson: GRia BushOther Clinician: Referring Jayon Matton: GRia BushTreating [name] [name]/Extender: HYaakov Guthriein Treatment: 7 Edema Assessment Assessed: [Left: No] [Right: Yes] Edema: [Left: Ye] [Right: s] Calf Left: Right: Point of Measurement: 30 cm From Medial Instep 39 cm Ankle Left: Right: Point of Measurement: 13 cm From Medial Instep 22.5 cm Vascular Assessment Pulses: Dorsalis Pedis Palpable: [Right:Yes] Electronic Signature(s) Signed: 01/15/2022 4:06:03 PM By: BDonnamarie PoagEntered By: BDonnamarie Poagon  01/15/2022 11:36:46 [name]([id number] -------------------------------------------------------------------------------- Multi Wound Chart Details Patient Name: [name] Date of Service: 01/15/2022 11:15 AM Medical Record Number: [id number] Account Number: [id number] of Birth/Sex: 901/07/1946(76y.o. F) Treating RN: BDonnamarie PoagPrimary Care Tyri Elmore: GRia BushOther Clinician: Referring Lyndsee Casa: GRia BushTreating Bayard More/Extender: HYaakov Guthriein Treatment: 7 Vital Signs Height(in): 62 Pulse(bpm): 968Weight(lbs): 199 Blood Pressure(mmHg): 193/85 Body Mass Index(BMI): 36.4 Temperature(F): 98.5 Respiratory Rate(breaths/min): 16 Photos: [N/A:N/A] Wound Location: Right, Posterior Lower Leg Right, Medial Lower Leg N/A Wounding Event: Gradually Appeared Gradually Appeared N/A Primary Etiology: Lymphedema Lymphedema N/A Secondary Etiology: Venous Leg Ulcer Venous Leg Ulcer  N/A Comorbid History: Cataracts, Asthma, Sleep Apnea, Cataracts, Asthma, Sleep Apnea, N/A Deep Vein Thrombosis, Deep Vein Thrombosis, Hypertension, Peripheral Venous Hypertension, Peripheral Venous Disease, Osteoarthritis, Received Disease, Osteoarthritis, Received Chemotherapy, Received Radiation Chemotherapy, Received Radiation Date Acquired: 10/27/2021 10/26/2021 N/A Weeks of Treatment: 7 3 N/A Wound Status: Open Open N/A Wound Recurrence: No No N/A Measurements L x W x D (cm) 5.6x6.3x0.2 2x1.5x0.3 N/A Area (cm) : 27.709 2.356 N/A Volume (cm) : 5.542 0.707 N/A % Reduction in Area: 70.40% -42.90% N/A % Reduction in Volume: 40.70% -328.50% N/A Classification: Full Thickness Without Exposed Full Thickness Without Exposed N/A Support Structures Support Structures Exudate Amount: Large Medium N/A Exudate Type: Serosanguineous Serosanguineous N/A Exudate Color: red, brown red, brown N/A Wound Margin: Indistinct, nonvisible N/A N/A Granulation Amount:  Medium (34-66%) Small (1-33%) N/A Granulation Quality: Pink, Pale Pink, Hyper-granulation N/A Necrotic Amount: Medium (34-66%) Large (67-100%) N/A Exposed Structures: Fat Layer (Subcutaneous Tissue): Fat Layer (Subcutaneous Tissue): N/A Yes Yes Fascia: No Tendon: No Muscle: No Joint: No Bone: No Epithelialization: Small (1-33%) None N/A Assessment Notes: measurement boxed N/A N/A Treatment Notes Electronic Signature(s) [name], [name] ([id number]) Signed: 01/15/2022 4:06:03 PM By: Donnamarie Poag Entered By: Donnamarie Poag on 01/15/2022 11:39:01 Solan, [name] ([id number]) -------------------------------------------------------------------------------- [name] Details Patient Name: CAMREE, WIGINGTON. Date of Service: 01/15/2022 11:15 AM Medical Record Number: [id number] Patient Account Number: [id number] Date of Birth/Sex: 1945-11-08 (76 y.o. F) Treating RN: Donnamarie Poag Primary Care Mae Cianci: [name] Other Clinician: Referring Syaire Saber: [name] Treating Shamaine Mulkern/Extender: Yaakov Guthrie in Treatment: 7 Active Inactive Necrotic Tissue Nursing Diagnoses: Impaired tissue integrity related to necrotic/devitalized tissue Knowledge deficit related to management of necrotic/devitalized tissue Goals: Necrotic/devitalized tissue will be minimized in the wound bed Date Initiated: 11/27/2021 Target Resolution Date: 12/25/2021 Goal Status: Active Patient/caregiver will verbalize understanding of reason and process for debridement of necrotic tissue Date Initiated: 11/27/2021 Target Resolution Date: 12/25/2021 Goal Status: Active Interventions: Assess patient pain level pre-, during and post procedure and prior to discharge Provide education on necrotic tissue and debridement process Notes: Soft Tissue Infection Nursing Diagnoses: Impaired tissue integrity Knowledge deficit related to disease process and management Knowledge deficit related to  home infection control: handwashing, handling of soiled dressings, supply storage Potential for infection: soft tissue Goals: Patient will remain free of wound infection Date Initiated: 11/27/2021 Target Resolution Date: 12/25/2021 Goal Status: Active Patient/caregiver will verbalize understanding of or measures to prevent infection and contamination in the home setting Date Initiated: 11/27/2021 Target Resolution Date: 12/25/2021 Goal Status: Active Patient's soft tissue infection will resolve Date Initiated: 11/27/2021 Target Resolution Date: 12/25/2021 Goal Status: Active Signs and symptoms of infection will be recognized early to allow for prompt treatment Date Initiated: 11/27/2021 Target Resolution Date: 12/25/2021 Goal Status: Active Interventions: Assess signs and symptoms of infection every visit Provide education on infection Treatment Activities: Culture and sensitivity : 11/27/2021 Education provided on Infection : 01/02/2022 Notes: Venous Leg Ulcer MYRTIE, LEUTHOLD ([id number]) Nursing Diagnoses: Actual venous Insuffiency (use after diagnosis is confirmed) Knowledge deficit related to disease process and management Potential for venous Insuffiency (use before diagnosis confirmed) Goals: Patient will maintain optimal edema control Date Initiated: 11/27/2021 Target Resolution Date: 12/25/2021 Goal Status: Active Patient/caregiver will verbalize understanding of disease process and disease management Date Initiated: 11/27/2021 Date Inactivated: 01/15/2022 Target Resolution Date: 12/25/2021 Goal Status: Met Verify adequate tissue perfusion prior to therapeutic compression application Date Initiated: 11/27/2021 Target Resolution Date: 12/25/2021 Goal Status: Active Interventions: Provide education on venous insufficiency Treatment Activities:  Therapeutic compression applied : 11/27/2021 Notes: Wound/Skin Impairment Nursing Diagnoses: Impaired tissue integrity Knowledge  deficit related to smoking impact on wound healing Knowledge deficit related to ulceration/compromised skin integrity Goals: Patient/caregiver will verbalize understanding of skin care regimen Date Initiated: 11/27/2021 Date Inactivated: 01/15/2022 Target Resolution Date: 12/25/2021 Goal Status: Met Ulcer/skin breakdown will have a volume reduction of 30% by week 4 Date Initiated: 11/27/2021 Target Resolution Date: 12/25/2021 Goal Status: Active Ulcer/skin breakdown will have a volume reduction of 50% by week 8 Date Initiated: 11/27/2021 Target Resolution Date: 01/22/2022 Goal Status: Active Ulcer/skin breakdown will have a volume reduction of 80% by week 12 Date Initiated: 11/27/2021 Target Resolution Date: 02/22/2022 Goal Status: Active Ulcer/skin breakdown will heal within 14 weeks Date Initiated: 11/27/2021 Target Resolution Date: 03/08/2022 Goal Status: Active Interventions: Assess ulceration(s) every visit Provide education on ulcer and skin care Treatment Activities: Skin care regimen initiated : 11/27/2021 Topical wound management initiated : 11/27/2021 Notes: Electronic Signature(s) Signed: 01/15/2022 4:06:03 PM By: Donnamarie Poag Entered By: Donnamarie Poag on 01/15/2022 11:38:43 [name], [name] ([id number]) -------------------------------------------------------------------------------- Pain Assessment Details Patient Name: [name], [name]. Date of Service: 01/15/2022 11:15 AM Medical Record Number: [id number] Patient Account Number: [id number] Date of Birth/Sex: January 18, 1946 (76 y.o. F) Treating RN: Donnamarie Poag Primary Care Bevelyn Arriola: [name] Other Clinician: Referring Malic Rosten: [name] Treating Shoichi Mielke/Extender: Yaakov Guthrie in Treatment: 7 Active Problems Location of Pain Severity and Description of Pain Patient Has Paino Yes Site Locations Pain Location: Generalized Pain, Pain in Ulcers Rate the pain. Current Pain Level: 5 Pain Management and  Medication Current Pain Management: Electronic Signature(s) Signed: 01/15/2022 4:06:03 PM By: Donnamarie Poag Entered By: Donnamarie Poag on 01/15/2022 11:31:02 [name], [name] ([id number]) -------------------------------------------------------------------------------- Patient/Caregiver Education Details Patient Name: [name], [name]. Date of Service: 01/15/2022 11:15 AM Medical Record Number: [id number] Patient Account Number: [id number] Date of Birth/Gender: Dec 07, 1945 (76 y.o. F) Treating RN: Donnamarie Poag Primary Care Physician: [name] Other Clinician: Referring Physician: [name] Treating Physician/Extender: Yaakov Guthrie in Treatment: 7 Education Assessment Education Provided To: Patient Education Topics Provided Infection: Venous: Wound Debridement: Wound/Skin Impairment: Electronic Signature(s) Signed: 01/15/2022 4:06:03 PM By: Donnamarie Poag Entered By: Donnamarie Poag on 01/15/2022 11:49:53 [name], [name] ([id number]) -------------------------------------------------------------------------------- Wound Assessment Details Patient Name: [name], [name]. Date of Service: 01/15/2022 11:15 AM Medical Record Number: [id number] Patient Account Number: [id number] Date of Birth/Sex: [birth year] (76 y.o. F) Treating RN: Donnamarie Poag Primary Care Alissia Lory: [name] Other Clinician: Referring Astraea Gaughran: [name] Treating Clarinda Obi/Extender: Yaakov Guthrie in Treatment: 7 Wound Status Wound Number: 15 Primary Lymphedema Etiology: Wound Location: Right, Posterior Lower Leg Secondary Venous Leg Ulcer Wounding Event: Gradually Appeared Etiology: Date Acquired: 10/27/2021 Wound Open Weeks Of Treatment: 7 Status: Clustered Wound: No Notes: wound is boxed in due to odd shape Comorbid Cataracts, Asthma, Sleep Apnea, Deep Vein Thrombosis, History: Hypertension, Peripheral Venous Disease, Osteoarthritis, Received Chemotherapy, Received  Radiation Photos Wound Measurements Length: (cm) 5.6 Width: (cm) 6.3 Depth: (cm) 0.2 Area: (cm) 27.709 Volume: (cm) 5.542 % Reduction in Area: 70.4% % Reduction in Volume: 40.7% Epithelialization: Small (1-33%) Tunneling: No Undermining: No Wound Description Classification: Full Thickness Without Exposed Support Structu Wound Margin: Indistinct, nonvisible Exudate Amount: Large Exudate Type: Serosanguineous Exudate Color: red, brown res Foul Odor After Cleansing: No Slough/Fibrino Yes Wound Bed Granulation Amount: Medium (34-66%) Exposed Structure Granulation Quality: Pink, Pale Fascia Exposed: No Necrotic Amount: Medium (34-66%) Fat Layer (Subcutaneous Tissue) Exposed: Yes Necrotic Quality: Adherent Slough Tendon Exposed: No Muscle Exposed: No Joint  Exposed: No Bone Exposed: No Assessment Notes measurement boxed Treatment Notes Wound #15 (Lower Leg) Wound Laterality: Right, Posterior [name], [name] Kitchen ([id number]) Cleanser Soap and Water Discharge Instruction: Gently cleanse wound with antibacterial soap, rinse and pat dry prior to dressing wounds Peri-Wound Care Triamcinolone Acetonide Cream, 0.1%, 15 (g) tube Discharge Instruction: PERI WOUND-Apply as directed. Topical Gentamicin Discharge Instruction: tO WOUND-Apply as directed by Sanjeev Main. Primary Dressing Hydrofera Blue Ready Transfer Foam, 4x5 (in/in) Discharge Instruction: Apply Hydrofera Blue Ready to wound bed as directed Secondary Dressing Xtrasorb Large 6x9 (in/in) Discharge Instruction: Apply to wound as directed. Do not cut. Secured With Compression Wrap 3-LAYER WRAP - Profore Lite LF 3 Multilayer Compression Bandaging System Discharge Instruction: Apply 3 multi-layer wrap as prescribed. Compression Stockings Add-Ons Electronic Signature(s) Signed: 01/15/2022 4:06:03 PM By: Donnamarie Poag Entered By: Donnamarie Poag on 01/15/2022 11:35:54 Goldie, [name]  ([id number]) -------------------------------------------------------------------------------- Wound Assessment Details Patient Name: [name], [name]. Date of Service: 01/15/2022 11:15 AM Medical Record Number: [id number] Patient Account Number: [id number] Date of Birth/Sex: [birth year] (76 y.o. F) Treating RN: Donnamarie Poag Primary Care Jaiyden Laur: [name] Other Clinician: Referring Treg Diemer: [name] Treating Luretha Eberly/Extender: Yaakov Guthrie in Treatment: 7 Wound Status Wound Number: 16 Primary Lymphedema Etiology: Wound Location: Right, Medial Lower Leg Secondary Venous Leg Ulcer Wounding Event: Gradually Appeared Etiology: Date Acquired: 10/26/2021 Wound Open Weeks Of Treatment: 3 Status: Clustered Wound: No Comorbid Cataracts, Asthma, Sleep Apnea, Deep Vein Thrombosis, History: Hypertension, Peripheral Venous Disease, Osteoarthritis, Received Chemotherapy, Received Radiation Photos Wound Measurements Length: (cm) 2 Width: (cm) 1.5 Depth: (cm) 0.3 Area: (cm) 2.356 Volume: (cm) 0.707 % Reduction in Area: -42.9% % Reduction in Volume: -328.5% Epithelialization: None Tunneling: No Undermining: No Wound Description Classification: Full Thickness Without Exposed Support Structures Exudate Amount: Medium Exudate Type: Serosanguineous Exudate Color: red, brown Foul Odor After Cleansing: No Slough/Fibrino Yes Wound Bed Granulation Amount: Small (1-33%) Exposed Structure Granulation Quality: Pink, Hyper-granulation Fat Layer (Subcutaneous Tissue) Exposed: Yes Necrotic Amount: Large (67-100%) Necrotic Quality: Adherent Slough Treatment Notes Wound #16 (Lower Leg) Wound Laterality: Right, Medial Cleanser Soap and Water Discharge Instruction: Gently cleanse wound with antibacterial soap, rinse and pat dry prior to dressing wounds Peri-Wound Care Triamcinolone Acetonide Cream, 0.1%, 15 (g) tube KISA, FUJII ([id number]) Discharge Instruction:  PERI WOUND-Apply as directed. Topical Gentamicin Discharge Instruction: tO WOUND-Apply as directed by Aracelys Glade. Primary Dressing Hydrofera Blue Ready Transfer Foam, 4x5 (in/in) Discharge Instruction: Apply Hydrofera Blue Ready to wound bed as directed Secondary Dressing Xtrasorb Large 6x9 (in/in) Discharge Instruction: Apply to wound as directed. Do not cut. Secured With Compression Wrap 3-LAYER WRAP - Profore Lite LF 3 Multilayer Compression Bandaging System Discharge Instruction: Apply 3 multi-layer wrap as prescribed. Compression Stockings Add-Ons Electronic Signature(s) Signed: 01/15/2022 4:06:03 PM By: Donnamarie Poag Entered By: Donnamarie Poag on 01/15/2022 11:37:51 Vinluan, [name] ([id number]) -------------------------------------------------------------------------------- Vitals Details Patient Name: LAVENE, PENAGOS. Date of Service: 01/15/2022 11:15 AM Medical Record Number: [id number] Patient Account Number: [id number] Date of Birth/Sex: Aug 18, 1946 (76 y.o. F) Treating RN: Donnamarie Poag Primary Care Lamia Mariner: [name] Other Clinician: Referring Tanya Marvin: [name] Treating Catrice Zuleta/Extender: Yaakov Guthrie in Treatment: 7 Vital Signs Time Taken: 11:25 Temperature (F): 98.5 Height (in): 62 Pulse (bpm): 92 Weight (lbs): 199 Respiratory Rate (breaths/min): 16 Body Mass Index (BMI): 36.4 Blood Pressure (mmHg): 193/85 Reference Range: 80 - 120 mg / dl Electronic Signature(s) Signed: 01/15/2022 4:06:03 PM By: Donnamarie Poag Entered ByDonnamarie Poag on 01/15/2022 11:30:48

## 2022-01-17 ENCOUNTER — Ambulatory Visit: Payer: Medicare Other | Admitting: Family Medicine

## 2022-01-18 NOTE — Telephone Encounter (Signed)
Referral sent to [facility] [phone]. ?

## 2022-01-19 ENCOUNTER — Encounter: Payer: Medicare Other | Admitting: Physician Assistant

## 2022-01-19 ENCOUNTER — Other Ambulatory Visit: Payer: Self-pay

## 2022-01-19 DIAGNOSIS — I872 Venous insufficiency (chronic) (peripheral): Secondary | ICD-10-CM | POA: Diagnosis not present

## 2022-01-19 DIAGNOSIS — G4733 Obstructive sleep apnea (adult) (pediatric): Secondary | ICD-10-CM | POA: Diagnosis not present

## 2022-01-19 DIAGNOSIS — L97212 Non-pressure chronic ulcer of right calf with fat layer exposed: Secondary | ICD-10-CM | POA: Diagnosis not present

## 2022-01-19 DIAGNOSIS — L97811 Non-pressure chronic ulcer of other part of right lower leg limited to breakdown of skin: Secondary | ICD-10-CM | POA: Diagnosis not present

## 2022-01-19 DIAGNOSIS — I890 Lymphedema, not elsewhere classified: Secondary | ICD-10-CM | POA: Diagnosis not present

## 2022-01-19 DIAGNOSIS — I87331 Chronic venous hypertension (idiopathic) with ulcer and inflammation of right lower extremity: Secondary | ICD-10-CM | POA: Diagnosis not present

## 2022-01-19 DIAGNOSIS — I10 Essential (primary) hypertension: Secondary | ICD-10-CM | POA: Diagnosis not present

## 2022-01-19 DIAGNOSIS — L97812 Non-pressure chronic ulcer of other part of right lower leg with fat layer exposed: Secondary | ICD-10-CM | POA: Diagnosis not present

## 2022-01-19 DIAGNOSIS — Z85820 Personal history of malignant melanoma of skin: Secondary | ICD-10-CM | POA: Diagnosis not present

## 2022-01-19 NOTE — Progress Notes (Addendum)
[name] ([id number]) ?Visit Report for 01/19/2022 ?Chief Complaint Document Details ?Patient Name: [name], [name] ?Date of Service: 01/19/2022 2:45 PM ?Medical Record Number: [id number] ?Patient Account Number: [id number] ?Date of Birth/Sex: 1946-02-16 (76 y.o. F) ?Treating RN: Carlene Coria ?Primary Care Provider: [name] Other Clinician: ?Referring Provider: [name] ?Treating Provider/Extender: Jeri Cos ?Weeks in Treatment: 7 ?Information Obtained from: Patient ?Chief Complaint ?Right LE Ulcer ?Electronic Signature(s) ?Signed: 01/19/2022 2:43:26 PM By: Worthy Keeler PA-C ?Entered By: Worthy Keeler on 01/19/2022 14:43:25 ?[name] ([id number]) ?-------------------------------------------------------------------------------- ?HPI Details ?Patient Name: [name] ?Date of Service: 01/19/2022 2:45 PM ?Medical Record Number: [id number] ?Patient Account Number: [id number] ?Date of Birth/Sex: 1946-07-26 (76 y.o. F) ?Treating RN: Carlene Coria ?Primary Care Provider: [name] Other Clinician: ?Referring Provider: [name] ?Treating Provider/Extender: Jeri Cos ?Weeks in Treatment: 7 ?History of Present Illness ?HPI Description: Pleasant 76 year old with history of chronic venous insufficiency. No diabetes or peripheral vascular disease. Left ABI 1.29. ?Questionable history of left lower extremity DVT. ?She developed a recurrent ulceration on her left lateral calf in December 2015, which she attributes to poor diet and subsequent lower extremity ?edema. She underwent endovenous laser ablation of her left greater saphenous vein in 2010. She underwent laser ablation of accessory branch of ?left GSV in April 2016 by Dr. Kellie Simmering at [facility]. She was previously wearing Unna boots, which she tolerated well. ?Tolerating 2 layer compression and cadexomer iodine. ?She returns to clinic for follow-up and is without new complaints. She denies any significant pain at this time.  She reports persistent pain with ?pressure. No claudication or ischemic rest pain. No fever or chills. No drainage. ?READMISSION ?11/13/16; this is a 76 year old woman who is not a diabetic. She is here for a review of a painful area on her left medial lower extremity. I note that ?she was seen here previously last year for wound I believe to be in the same area. At that time she had undergone previously a left greater ?saphenous vein ablation by Dr. Kellie Simmering and she had a ablation of the anterior accessory branch of the left greater saphenous vein in March 2016. ?Seeing that the wound actually closed over. In reviewing the history with her today the ulcer in this area has been recurrent. She describes a ?biopsy of this area in 2009 that only showed stasis physiology. She also has a history of today malignant melanoma in the right shoulder for which ?she follows with Dr. 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 Do not see that she has ever had arterial studies in the left leg. She tells me she has a follow-up with Dr. Kellie Simmering in roughly 10 days ?In any case she developed the reopening of this area roughly a month ago. On the background of this she describes rapidly increasing edema ?which has responded to Lasix 40 mg and metolazone 2.5 mg as well as the patient's lymph massage. She has been told she has both venous ?insufficiency and lymphedema but she cannot tolerate compression stockings ?11/28/16; the patient saw Dr. Kellie Simmering recently. Per the patient he did arterial Dopplers in the office that did not show evidence of arterial ?insufficiency, per the patient he stated "treat this like an ordinary venous ulcer". She also saw her dermatologist Dr. Ronnald Ramp who felt that this was ?more of a vascular ulcer. In general things are improving although she  arrives today with increasing bilateral lower extremity  edema with weeping ?a deeper fluid through the wound on the left medial leg compatible with some degree of lymphedema ?12/04/16; the patient's wound is fully epithelialized but I don't think fully healed. We will do another week of depression with Promogran and TCA ?however I suspect we'll be able to discharge her next week. This is a very unusual-looking wound which was initially a figure-of-eight type wound ?lying on its side surrounded by petechial like hemorrhage. She has had venous ablation on this side. She apparently does not have an arterial ?issue per Dr. Kellie Simmering. She saw her dermatologist thought it was "vascular". Patient is definitely going to need ongoing compression and I talked ?about this with her today she will go to elastic therapy after she leaves here next week ?12/11/16; the patient's wound is not completely closed today. She has surrounding scar tissue and in further discussion with the patient it would ?appear that she had ulcers in this area in 2009 for a prolonged period of time ultimately requiring a punch biopsy of this area that only showed ?venous insufficiency. I did not previously pickup on this part of the history from the patient. ?12/18/16; the patient's wound is completely epithelialized. There is no open area here. She has significant bilateral venous insufficiency with ?secondary lymphedema to a mild-to-moderate degree she does not have compression stockings.. She did not say anything to me when I was in ?the room, she told our intake nurse that she was still having pain in this area. This isn't unusual recurrent small open area. She is going to go to ?elastic therapy to obtain compression stockings. ?12/25/16; the patient's wound is fully epithelialized. There is no open area here. The patient describes some continued episodic discomfort in this ?area medial left calf. However everything looks fine and healed here. She is been to elastic therapy and caught herself 15-20 mmHg  stockings, ?they apparently were having trouble getting 20-30 mm stockings in her size ?01/22/17; this is a patient we discharged from the clinic a month ago. She has a recurrent open wound on her medial left calf. She had 15 mm ?support stockings. I told her I thought she needed 20-30 mm compression stockings. She tells me that she has been ill with hospitalization ?secondary to asthma and is been found to have severe hypokalemia likely secondary to a combination of Lasix and metolazone. This morning she ?noted blistering and leaking fluid on the posterior part of her left leg. She called our intake nurse urgently and we was saw her this afternoon. She ?has not had any real discomfort here. I don't know that she's been wearing any stockings on this leg for at least 2-3 days. ABIs in this clinic were ?1.21 on the right and 1.3 on the left. She is previously seen vascular surgery who does not think that there is a peripheral arterial issue. ?01/30/17; Patient arrives with no open wound on the left leg. She has been to elastic therapy and obtained 20-66mhg below knee stockings and ?she has one on the right leg today. ?READMISSION ?02/19/18; this PBuccieriis a now 76year old patient we've had in this clinic perhaps 3 times before. I had last looked at her from January 2 February ?2018 with an area on the medial left leg. We discharged her on 12/25/16 however she had to be readmitted on 01/22/17 with a recurrence. I have ?in my notes that we discharged her on 20-30 mm stockings although she tells me  she was only wearing support hose because she cannot get ?stockings on predominantly related to her cervical spine surgery/issues. She has had previous ablations done by vein and vascular in [place] ?including a great saphenous vein ablation on the left with an anterior accessory branch ablation I think both of these were in 2016. On one of the ?previous visit she had a biopsy noted 2009 that was negative. She is not felt to  have an arterial issue. She is not a diabetic. She does have a ?history of obstructive sleep apnea hypertension asthma as well as chronic venous insufficiency and lymphedema. ?On this occasion she noted 2 dry s

## 2022-01-19 NOTE — Progress Notes (Addendum)
[name], [name] ([id number]) ?Visit Report for 01/19/2022 ?Arrival Information Details ?Patient Name: [name], [name] ?Date of Service: 01/19/2022 2:45 PM ?Medical Record Number: [id number] ?Patient Account Number: [id number] ?Date of Birth/Sex: 12-11-1945 (76 y.o. F) ?Treating RN: Carlene Coria ?Primary Care Adon Gehlhausen: [name] Other Clinician: ?Referring Emory Gallentine: [name] ?Treating Marjon Doxtater/Extender: Jeri Cos ?Weeks in Treatment: 7 ?Visit Information History Since Last Visit ?All ordered tests and consults were completed: No ?Patient Arrived: Ambulatory ?Added or deleted any medications: No ?Arrival Time: 14:48 ?Any new allergies or adverse reactions: No ?Accompanied By: self ?Had a fall or experienced change in No ?Transfer Assistance: None ?activities of daily living that may affect ?Patient Identification Verified: Yes ?risk of falls: ?Secondary Verification Process Completed: Yes ?Signs or symptoms of abuse/neglect since last visito No ?Patient Requires Transmission-Based No ?Hospitalized since last visit: No ?Precautions: ?Implantable device outside of the clinic excluding No ?Patient Has Alerts: Yes ?cellular tissue based products placed in the center ?Patient Alerts: Patient on Blood ?since last visit: ?Thinner ?Has Dressing in Place as Prescribed: Yes ?'81mg'$  aspirin ?Has Compression in Place as Prescribed: Yes ?Pain Present Now: No ?Electronic Signature(s) ?Signed: 01/19/2022 4:20:50 PM By: Carlene Coria RN ?Entered By: Carlene Coria on 01/19/2022 14:54:42 ?[name], [name] ([id number]) ?-------------------------------------------------------------------------------- ?Clinic Level of Care Assessment Details ?Patient Name: [name], [name] ?Date of Service: 01/19/2022 2:45 PM ?Medical Record Number: [id number] ?Patient Account Number: [id number] ?Date of Birth/Sex: Jul 10, 1946 (76 y.o. F) ?Treating RN: Carlene Coria ?Primary Care Bhavya Eschete: [name] Other Clinician: ?Referring Nazeer Romney:  [name] ?Treating Tivis Wherry/Extender: Jeri Cos ?Weeks in Treatment: 7 ?Clinic Level of Care Assessment Items ?TOOL 1 Quantity Score ?'[]'$  - Use when EandM and Procedure is performed on INITIAL visit 0 ?ASSESSMENTS - Nursing Assessment / Reassessment ?'[]'$  - General Physical Exam (combine w/ comprehensive assessment (listed just below) when performed on new ?0 ?pt. evals) ?'[]'$  - 0 ?Comprehensive Assessment (HX, ROS, Risk Assessments, Wounds Hx, etc.) ?ASSESSMENTS - Wound and Skin Assessment / Reassessment ?'[]'$  - Dermatologic / Skin Assessment (not related to wound area) 0 ?ASSESSMENTS - Ostomy and/or Continence Assessment and Care ?'[]'$  - Incontinence Assessment and Management 0 ?'[]'$  - 0 ?Ostomy Care Assessment and Management (repouching, etc.) ?PROCESS - Coordination of Care ?'[]'$  - Simple Patient / Family Education for ongoing care 0 ?'[]'$  - 0 ?Complex (extensive) Patient / Family Education for ongoing care ?'[]'$  - 0 ?Staff obtains Consents, Records, Test Results / Process Orders ?'[]'$  - 0 ?Staff telephones HHA, Nursing Homes / Clarify orders / etc ?'[]'$  - 0 ?Routine Transfer to another Facility (non-emergent condition) ?'[]'$  - 0 ?Routine Hospital Admission (non-emergent condition) ?'[]'$  - 0 ?New Admissions / Biomedical engineer / Ordering NPWT, Apligraf, etc. ?'[]'$  - 0 ?Emergency Hospital Admission (emergent condition) ?PROCESS - Special Needs ?'[]'$  - Pediatric / Minor Patient Management 0 ?'[]'$  - 0 ?Isolation Patient Management ?'[]'$  - 0 ?Hearing / Language / Visual special needs ?'[]'$  - 0 ?Assessment of Community assistance (transportation, D/C planning, etc.) ?'[]'$  - 0 ?Additional assistance / Altered mentation ?'[]'$  - 0 ?Support Surface(s) Assessment (bed, cushion, seat, etc.) ?INTERVENTIONS - Miscellaneous ?'[]'$  - External ear exam 0 ?'[]'$  - 0 ?Patient Transfer (multiple staff / Civil Service fast streamer / Similar devices) ?'[]'$  - 0 ?Simple Staple / Suture removal (25 or less) ?'[]'$  - 0 ?Complex Staple / Suture removal (26 or more) ?'[]'$  -  0 ?Hypo/Hyperglycemic Management (do not check if billed separately) ?'[]'$  - 0 ?Ankle / Brachial Index (ABI) - do not check if billed separately ?Has the patient  been seen at the hospital within the last three years: Yes ?Total Score: 0 ?Level Of Care: ____ ?[name], [name] ([id number]) ?Electronic Signature(s) ?Signed: 01/19/2022 4:20:50 PM By: Carlene Coria RN ?Entered By: Carlene Coria on 01/19/2022 15:15:19 ?[name], [name] ([id number]) ?-------------------------------------------------------------------------------- ?Compression Therapy Details ?Patient Name: [name], [name] ?Date of Service: 01/19/2022 2:45 PM ?Medical Record Number: [id number] ?Patient Account Number: [id number] ?Date of Birth/Sex: 11-04-1946 (76 y.o. F) ?Treating RN: Carlene Coria ?Primary Care Jameah Rouser: [name] Other Clinician: ?Referring Carrissa Taitano: [name] ?Treating Davion Meara/Extender: Jeri Cos ?Weeks in Treatment: 7 ?Compression Therapy Performed for Wound Assessment: Wound #15 Right,Posterior Lower Leg ?Performed By: Clinician Carlene Coria, RN ?Compression Type: Three Layer ?Post Procedure Diagnosis ?Same as Pre-procedure ?Electronic Signature(s) ?Signed: 01/19/2022 4:20:50 PM By: Carlene Coria RN ?Entered By: Carlene Coria on 01/19/2022 15:14:01 ?[name], [name] ([id number]) ?-------------------------------------------------------------------------------- ?Compression Therapy Details ?Patient Name: [name], [name] ?Date of Service: 01/19/2022 2:45 PM ?Medical Record Number: [id number] ?Patient Account Number: [id number] ?Date of Birth/Sex: [birth year] (76 y.o. F) ?Treating RN: Carlene Coria ?Primary Care [name]-Ann Lindfors: [name] Other Clinician: ?Referring Vidal Lampkins: [name] ?Treating Todd Jelinski/Extender: Jeri Cos ?Weeks in Treatment: 7 ?Compression Therapy Performed for Wound Assessment: Wound #16 Right,Medial Lower Leg ?Performed By: Clinician Carlene Coria, RN ?Compression Type: Three Layer ?Post Procedure  Diagnosis ?Same as Pre-procedure ?Electronic Signature(s) ?Signed: 01/19/2022 4:20:50 PM By: Carlene Coria RN ?Entered By: Carlene Coria on 01/19/2022 15:14:36 ?[name], [name] ([id number]) ?-------------------------------------------------------------------------------- ?Encounter Discharge Information Details ?Patient Name: [name], [name] ?Date of Service: 01/19/2022 2:45 PM ?Medical Record Number: [id number] ?Patient Account Number: [id number] ?Date of Birth/Sex: 06/11/1946 (76 y.o. F) ?Treating RN: Carlene Coria ?Primary Care Samie Reasons: [name] Other Clinician: ?Referring Kapono Luhn: [name] ?Treating Johnross Nabozny/Extender: Jeri Cos ?Weeks in Treatment: 7 ?Encounter Discharge Information Items ?Discharge Condition: Stable ?Ambulatory Status: Ambulatory ?Discharge Destination: Home ?Transportation: Private Auto ?Accompanied By: self ?Schedule Follow-up Appointment: Yes ?Clinical Summary of Care: Patient Declined ?Electronic Signature(s) ?Signed: 01/19/2022 4:20:50 PM By: Carlene Coria RN ?Entered By: Carlene Coria on 01/19/2022 15:27:45 ?[name], [name] ([id number]) ?-------------------------------------------------------------------------------- ?Lower Extremity Assessment Details ?Patient Name: MAGDALENA, SKILTON ?Date of Service: 01/19/2022 2:45 PM ?Medical Record Number: [id number] ?Patient Account Number: [id number] ?Date of Birth/Sex: Dec 29, 1945 (76 y.o. F) ?Treating RN: Carlene Coria ?Primary Care Nakeita Styles: [name] Other Clinician: ?Referring Janne Faulk: [name] ?Treating Lagina Reader/Extender: Jeri Cos ?Weeks in Treatment: 7 ?Edema Assessment ?Assessed: [Left: No] [Right: No] ?Edema: [Left: Ye] [Right: s] ?Vascular Assessment ?Pulses: ?Dorsalis Pedis ?Palpable: [Right:Yes] ?Electronic Signature(s) ?Signed: 01/19/2022 4:20:50 PM By: Carlene Coria RN ?Entered By: Carlene Coria on 01/19/2022 15:07:04 ?CONSETTA, COSNER  ([id number]) ?-------------------------------------------------------------------------------- ?Multi Wound Chart Details ?Patient Name: KHADEJA, ABT ?Date of Service: 01/19/2022 2:45 PM ?Medical Record Number: [id number] ?Patient Account Number: [id number] ?Date of Birth/Sex: May 27, 1946 (76 y.o. F) ?Treating RN: E

## 2022-01-21 ENCOUNTER — Other Ambulatory Visit: Payer: Self-pay | Admitting: Family Medicine

## 2022-01-22 ENCOUNTER — Ambulatory Visit: Payer: Medicare Other | Admitting: Physician Assistant

## 2022-01-23 ENCOUNTER — Telehealth: Payer: Self-pay | Admitting: Family Medicine

## 2022-01-23 NOTE — Telephone Encounter (Signed)
Pt called stating that she had an episode last night where she woke up and didn't know where she was and what was going on. Pt wanted to make an appt but the earliest Dr G could take her was on 01/31/22. Pt is asking if Dr Darnell Level could fit her in at an earlier date. Please advise. ?

## 2022-01-23 NOTE — Telephone Encounter (Signed)
Patient is scheduled at [facility] ? ?Intake/Interview: 03/26/2022 ?Testing: 03/26/2022 ?Feedback: 04/12/2022.  ?

## 2022-01-24 DIAGNOSIS — Z85820 Personal history of malignant melanoma of skin: Secondary | ICD-10-CM | POA: Diagnosis not present

## 2022-01-24 DIAGNOSIS — Z79899 Other long term (current) drug therapy: Secondary | ICD-10-CM | POA: Diagnosis not present

## 2022-01-24 DIAGNOSIS — R9431 Abnormal electrocardiogram [ECG] [EKG]: Secondary | ICD-10-CM | POA: Diagnosis not present

## 2022-01-24 DIAGNOSIS — Z87891 Personal history of nicotine dependence: Secondary | ICD-10-CM | POA: Diagnosis not present

## 2022-01-24 DIAGNOSIS — Z87442 Personal history of urinary calculi: Secondary | ICD-10-CM | POA: Diagnosis not present

## 2022-01-24 DIAGNOSIS — Z7982 Long term (current) use of aspirin: Secondary | ICD-10-CM | POA: Diagnosis not present

## 2022-01-24 DIAGNOSIS — Z9989 Dependence on other enabling machines and devices: Secondary | ICD-10-CM | POA: Diagnosis not present

## 2022-01-24 DIAGNOSIS — Z91138 Patient's unintentional underdosing of medication regimen for other reason: Secondary | ICD-10-CM | POA: Diagnosis not present

## 2022-01-24 DIAGNOSIS — T503X6A Underdosing of electrolytic, caloric and water-balance agents, initial encounter: Secondary | ICD-10-CM | POA: Diagnosis present

## 2022-01-24 DIAGNOSIS — Z91040 Latex allergy status: Secondary | ICD-10-CM | POA: Diagnosis not present

## 2022-01-24 DIAGNOSIS — E876 Hypokalemia: Secondary | ICD-10-CM | POA: Diagnosis not present

## 2022-01-24 DIAGNOSIS — L03119 Cellulitis of unspecified part of limb: Secondary | ICD-10-CM | POA: Diagnosis not present

## 2022-01-24 DIAGNOSIS — Z9221 Personal history of antineoplastic chemotherapy: Secondary | ICD-10-CM | POA: Diagnosis not present

## 2022-01-24 DIAGNOSIS — J45909 Unspecified asthma, uncomplicated: Secondary | ICD-10-CM | POA: Diagnosis present

## 2022-01-24 DIAGNOSIS — Z86718 Personal history of other venous thrombosis and embolism: Secondary | ICD-10-CM | POA: Diagnosis not present

## 2022-01-24 DIAGNOSIS — Z881 Allergy status to other antibiotic agents status: Secondary | ICD-10-CM | POA: Diagnosis not present

## 2022-01-24 DIAGNOSIS — G4733 Obstructive sleep apnea (adult) (pediatric): Secondary | ICD-10-CM | POA: Diagnosis not present

## 2022-01-24 DIAGNOSIS — Z888 Allergy status to other drugs, medicaments and biological substances status: Secondary | ICD-10-CM | POA: Diagnosis not present

## 2022-01-24 DIAGNOSIS — I872 Venous insufficiency (chronic) (peripheral): Secondary | ICD-10-CM | POA: Diagnosis present

## 2022-01-24 DIAGNOSIS — M7989 Other specified soft tissue disorders: Secondary | ICD-10-CM | POA: Diagnosis not present

## 2022-01-24 DIAGNOSIS — Z882 Allergy status to sulfonamides status: Secondary | ICD-10-CM | POA: Diagnosis not present

## 2022-01-24 DIAGNOSIS — Z981 Arthrodesis status: Secondary | ICD-10-CM | POA: Diagnosis not present

## 2022-01-24 DIAGNOSIS — Z23 Encounter for immunization: Secondary | ICD-10-CM | POA: Diagnosis not present

## 2022-01-24 DIAGNOSIS — L03116 Cellulitis of left lower limb: Secondary | ICD-10-CM | POA: Diagnosis not present

## 2022-01-24 DIAGNOSIS — I890 Lymphedema, not elsewhere classified: Secondary | ICD-10-CM | POA: Diagnosis present

## 2022-01-24 DIAGNOSIS — Z923 Personal history of irradiation: Secondary | ICD-10-CM | POA: Diagnosis not present

## 2022-01-24 DIAGNOSIS — I10 Essential (primary) hypertension: Secondary | ICD-10-CM | POA: Diagnosis not present

## 2022-01-24 DIAGNOSIS — E559 Vitamin D deficiency, unspecified: Secondary | ICD-10-CM | POA: Diagnosis present

## 2022-01-24 DIAGNOSIS — G3184 Mild cognitive impairment, so stated: Secondary | ICD-10-CM | POA: Diagnosis not present

## 2022-01-24 NOTE — Telephone Encounter (Signed)
Pt scheduled with Dr. Silvio Pate tomorrow at 3:00. ?

## 2022-01-24 NOTE — Progress Notes (Signed)
DANELI, BUTKIEWICZ ([id number]) ?Visit Report for 01/24/2022 ?Physician Orders Details ?Patient Name: [name], [name] ?Date of Service: 01/24/2022 10:30 AM ?Medical Record Number: [id number] ?Patient Account Number: [id number] ?Date of Birth/Sex: 06-03-1946 (76 y.o. F) ?Treating RN: Donnamarie Poag ?Primary Care Provider: [name] Other Clinician: ?Referring Provider: [name] ?Treating Provider/Extender: Kalman Shan ?Weeks in Treatment: 8 ?Verbal / Phone Orders: No ?Diagnosis Coding ?Follow-up Appointments ?o Return Appointment in 1 week. ?o Nurse Visit as needed ?Bathing/ Shower/ Hygiene ?o May shower with wound dressing protected with water repellent cover or cast protector. ?o No tub bath. ?Anesthetic (Use 'Patient Medications' Section for Anesthetic Order Entry) ?o Lidocaine applied to wound bed ?Edema Control - Lymphedema / Segmental Compressive Device / Other ?o Optional: One layer of unna paste to top of compression wrap (to act as an anchor). ?o Elevate, Exercise Daily and Avoid Standing for Long Periods of Time. ?o Elevate leg(s) parallel to the floor when sitting. ?o DO YOUR BEST to sleep in the bed at night. DO NOT sleep in your recliner. Long hours of sitting in a recliner leads to ?swelling of the legs and/or potential wounds on your backside. ?Wound Treatment ?Wound #15 - Lower Leg Wound Laterality: Right, Posterior ?Cleanser: Soap and Water 2 x Per Week/15 Days ?Discharge Instructions: Gently cleanse wound with antibacterial soap, rinse and pat dry prior to dressing wounds ?Peri-Wound Care: Triamcinolone Acetonide Cream, 0.1%, 15 (g) tube 2 x Per Week/15 Days ?Discharge Instructions: PERI WOUND-Apply as directed. ?Primary Dressing: Hydrofera Blue Ready Transfer Foam, 4x5 (in/in) 2 x Per Week/15 Days ?Discharge Instructions: Apply Hydrofera Blue Ready to wound bed as directed ?Secondary Dressing: Xtrasorb Large 6x9 (in/in) 2 x Per Week/15 Days ?Discharge Instructions:  Apply to wound as directed. Do not cut. ?Compression Wrap: 3-LAYER WRAP - Profore Lite LF 3 Multilayer Compression Bandaging System 2 x Per Week/15 Days ?Discharge Instructions: Apply 3 multi-layer wrap as prescribed. ?Wound #16 - Lower Leg Wound Laterality: Right, Medial ?Cleanser: Soap and Water 1 x Per Week/15 Days ?Discharge Instructions: Gently cleanse wound with antibacterial soap, rinse and pat dry prior to dressing wounds ?Peri-Wound Care: Triamcinolone Acetonide Cream, 0.1%, 15 (g) tube 1 x Per Week/15 Days ?Discharge Instructions: PERI WOUND-Apply as directed. ?Primary Dressing: Hydrofera Blue Ready Transfer Foam, 4x5 (in/in) 1 x Per Week/15 Days ?Discharge Instructions: Apply Hydrofera Blue Ready to wound bed as directed ?Secondary Dressing: Xtrasorb Large 6x9 (in/in) 1 x Per Week/15 Days ?Discharge Instructions: Apply to wound as directed. Do not cut. ?Compression Wrap: 3-LAYER WRAP - Profore Lite LF 3 Multilayer Compression Bandaging System 1 x Per Week/15 Days ?Discharge Instructions: Apply 3 multi-layer wrap as prescribed. ?PATRIA, WARZECHA ([id number]) ?Electronic Signature(s) ?Signed: 01/24/2022 10:55:40 AM By: Donnamarie Poag ?Signed: 01/24/2022 11:00:13 AM By: Kalman Shan DO ?Entered ByDonnamarie Poag on 01/24/2022 10:28:04 ?TASHANTI, DALPORTO ([id number]) ?-------------------------------------------------------------------------------- ?SuperBill Details ?Patient Name: RANATA, LAUGHERY ?Date of Service: 01/24/2022 ?Medical Record Number: [id number] ?Patient Account Number: [id number] ?Date of Birth/Sex: Jul 16, 1946 (76 y.o. F) ?Treating RN: Donnamarie Poag ?Primary Care Provider: [name] Other Clinician: ?Referring Provider: [name] ?Treating Provider/Extender: Kalman Shan ?Weeks in Treatment: 8 ?Diagnosis Coding ?ICD-10 Codes ?Code Description ?I89.0 Lymphedema, not elsewhere classified ?I87.331 Chronic venous hypertension (idiopathic) with ulcer and inflammation of right lower  extremity ?L97.811 Non-pressure chronic ulcer of other part of right lower leg limited to breakdown of skin ?Facility Procedures ?CPT4 Code: 88280034 ?Description: (Facility Use Only) [id number] - APPLY MULTLAY COMPRS LWR RT LEG ?Modifier: ?Quantity: 1 ?Electronic Signature(s) ?  Signed: 01/24/2022 10:55:40 AM By: Donnamarie Poag ?Signed: 01/24/2022 11:00:13 AM By: Kalman Shan DO ?Entered ByDonnamarie Poag on 01/24/2022 10:29:16 ?

## 2022-01-24 NOTE — Progress Notes (Signed)
ELVA, MAURO ([id number]) ?Visit Report for 01/24/2022 ?Arrival Information Details ?Patient Name: [name] ?Date of Service: 01/24/2022 10:30 AM ?Medical Record Number: [id number] ?Patient Account Number: [id number] ?Date of Birth/Sex: [birth year] (76 y.o. F) ?Treating RN: Donnamarie Poag ?Primary Care Ailis Rigaud: [name] Other Clinician: ?Referring Vinicius Brockman: [name] ?Treating Honor Frison/Extender: Kalman Shan ?Weeks in Treatment: 8 ?Visit Information History Since Last Visit ?Added or deleted any medications: No ?Patient Arrived: Ambulatory ?Had a fall or experienced change in No ?Arrival Time: 10:16 ?activities of daily living that may affect ?Accompanied By: self ?risk of falls: ?Transfer Assistance: None ?Hospitalized since last visit: No ?Patient Identification Verified: Yes ?Has Dressing in Place as Prescribed: Yes ?Secondary Verification Process Completed: Yes ?Has Compression in Place as Prescribed: Yes ?Patient Requires Transmission-Based No ?Pain Present Now: No ?Precautions: ?Patient Has Alerts: Yes ?Patient Alerts: Patient on Blood ?Thinner ?'81mg'$  aspirin ?[place] [facility] ABI ?Electronic Signature(s) ?Signed: 01/24/2022 10:55:40 AM By: Donnamarie Poag ?Entered ByDonnamarie Poag on 01/24/2022 10:27:23 ?RETA, [name] ([id number]) ?-------------------------------------------------------------------------------- ?Compression Therapy Details ?Patient Name: [name] ?Date of Service: 01/24/2022 10:30 AM ?Medical Record Number: [id number] ?Patient Account Number: [id number] ?Date of Birth/Sex: 1946/08/25 (76 y.o. F) ?Treating RN: Donnamarie Poag ?Primary Care Halo Shevlin: [name] Other Clinician: ?Referring Kadra Kohan: [name] ?Treating Khiya Friese/Extender: Kalman Shan ?Weeks in Treatment: 8 ?Compression Therapy Performed for Wound Assessment: Wound #15 Right,Posterior Lower Leg ?Performed By: Clinician Donnamarie Poag, RN ?Compression Type: Three Layer ?Electronic  Signature(s) ?Signed: 01/24/2022 10:55:40 AM By: Donnamarie Poag ?Entered ByDonnamarie Poag on 01/24/2022 10:28:30 ?ATLANTA, PELTO ([id number]) ?-------------------------------------------------------------------------------- ?Encounter Discharge Information Details ?Patient Name: [name] ?Date of Service: 01/24/2022 10:30 AM ?Medical Record Number: [id number] ?Patient Account Number: [id number] ?Date of Birth/Sex: [birth year] (76 y.o. F) ?Treating RN: Donnamarie Poag ?Primary Care Lawernce Earll: [name] Other Clinician: ?Referring Truett Mcfarlan: [name] ?Treating Annaelle Kasel/Extender: Kalman Shan ?Weeks in Treatment: 8 ?Encounter Discharge Information Items ?Discharge Condition: Stable ?Ambulatory Status: Ambulatory ?Discharge Destination: Home ?Transportation: Private Auto ?Accompanied By: self ?Schedule Follow-up Appointment: Yes ?Clinical Summary of Care: ?Electronic Signature(s) ?Signed: 01/24/2022 10:55:40 AM By: Donnamarie Poag ?Entered ByDonnamarie Poag on 01/24/2022 10:29:02 ?[name], [name] ([id number]) ?-------------------------------------------------------------------------------- ?Wound Assessment Details ?Patient Name: [name] ?Date of Service: 01/24/2022 10:30 AM ?Medical Record Number: [id number] ?Patient Account Number: [id number] ?Date of Birth/Sex: [birth year] (76 y.o. F) ?Treating RN: Donnamarie Poag ?Primary Care Jequan Shahin: [name] Other Clinician: ?Referring Gelisa Tieken: [name] ?Treating Camillia Marcy/Extender: Kalman Shan ?Weeks in Treatment: 8 ?Wound Status ?Wound Number: 15 Primary Lymphedema ?Etiology: ?Wound Location: Right, Posterior Lower Leg ?Secondary Venous Leg Ulcer ?Wounding Event: Gradually Appeared ?Etiology: ?Date Acquired: 10/27/2021 ?Wound Open ?Weeks Of Treatment: 8 ?Status: ?Clustered Wound: No ?Notes: wound is boxed in due to odd shape ?Comorbid Cataracts, Asthma, Sleep Apnea, Deep Vein Thrombosis, ?History: Hypertension, Peripheral Venous Disease,  Osteoarthritis, ?Received Chemotherapy, Received Radiation ?Wound Measurements ?Length: (cm) 5.6 ?Width: (cm) 6.3 ?Depth: (cm) 0.2 ?Area: (cm?) 27.709 ?Volume: (cm?) 5.542 ?% Reduction in Area: 70.4% ?% Reduction in Volume: 40.7% ?Epithelialization: Small (1-33%) ?Wound Description ?Classification: Full Thickness Without Exposed Support Structu ?Wound Margin: Indistinct, nonvisible ?Exudate Amount: Large ?Exudate Type: Serosanguineous ?Exudate Color: red, brown ?res Foul Odor After Cleansing: No ?Slough/Fibrino Yes ?Wound Bed ?Granulation Amount: Medium (34-66%) Exposed Structure ?Granulation Quality: Pink, Pale ?Fascia Exposed: No ?Necrotic Amount: Medium (34-66%) ?Fat Layer (Subcutaneous Tissue) Exposed: Yes ?Necrotic Quality: Adherent Slough ?Tendon Exposed: No ?Muscle Exposed: No ?Joint Exposed: No ?Bone Exposed: No ?Treatment Notes ?Wound #15 (Lower Leg) Wound Laterality: Right, Posterior ?Cleanser ?  Soap and Water ?Discharge Instruction: Gently cleanse wound with antibacterial soap, rinse and pat dry prior to dressing wounds ?Peri-Wound Care ?Triamcinolone Acetonide Cream, 0.1%, 15 (g) tube ?Discharge Instruction: PERI WOUND-Apply as directed. ?Topical ?Primary Dressing ?Hydrofera Blue Ready Transfer Foam, 4x5 (in/in) ?Discharge Instruction: Apply Hydrofera Blue Ready to wound bed as directed ?Secondary Dressing ?Xtrasorb Large 6x9 (in/in) ?[name] ([id number]) ?Discharge Instruction: Apply to wound as directed. Do not cut. ?Secured With ?Compression Wrap ?3-LAYER WRAP - Profore Lite LF 3 Multilayer Compression Bandaging System ?Discharge Instruction: Apply 3 multi-layer wrap as prescribed. ?Compression Stockings ?Add-Ons ?Electronic Signature(s) ?Signed: 01/24/2022 10:55:40 AM By: Donnamarie Poag ?Entered ByDonnamarie Poag on 01/24/2022 10:26:14 ?AISA, SCHOEPPNER ([id number]) ?-------------------------------------------------------------------------------- ?Wound Assessment Details ?Patient Name: [name] ?Date of Service: 01/24/2022 10:30 AM ?Medical Record Number: [id number] ?Patient Account Number: [id number] ?Date of Birth/Sex: 07/17/1946 (76 y.o. F) ?Treating RN: Donnamarie Poag ?Primary Care Nadeen Shipman: [name] Other Clinician: ?Referring Tyronn Golda: [name] ?Treating Keeanna Villafranca/Extender: Kalman Shan ?Weeks in Treatment: 8 ?Wound Status ?Wound Number: 16 Primary Lymphedema ?Etiology: ?Wound Location: Right, Medial Lower Leg ?Secondary Venous Leg Ulcer ?Wounding Event: Gradually Appeared ?Etiology: ?Date Acquired: 10/26/2021 ?Wound Open ?Weeks Of Treatment: 4 ?Status: ?Clustered Wound: No ?Comorbid Cataracts, Asthma, Sleep Apnea, Deep Vein Thrombosis, ?History: Hypertension, Peripheral Venous Disease, Osteoarthritis, ?Received Chemotherapy, Received Radiation ?Wound Measurements ?Length: (cm) 2 ?Width: (cm) 1.5 ?Depth: (cm) 0.3 ?Area: (cm?) 2.356 ?Volume: (cm?) 0.707 ?% Reduction in Area: -42.9% ?% Reduction in Volume: -328.5% ?Epithelialization: None ?Wound Description ?Classification: Full Thickness Without Exposed Support Structures ?Exudate Amount: Medium ?Exudate Type: Serosanguineous ?Exudate Color: red, brown ?Foul Odor After Cleansing: No ?Slough/Fibrino Yes ?Wound Bed ?Granulation Amount: Small (1-33%) Exposed Structure ?Granulation Quality: Pink, Hyper-granulation ?Fat Layer (Subcutaneous Tissue) Exposed: Yes ?Necrotic Amount: Large (67-100%) ?Necrotic Quality: Adherent Slough ?Treatment Notes ?Wound #16 (Lower Leg) Wound Laterality: Right, Medial ?Cleanser ?Soap and Water ?Discharge Instruction: Gently cleanse wound with antibacterial soap, rinse and pat dry prior to dressing wounds ?Peri-Wound Care ?Triamcinolone Acetonide Cream, 0.1%, 15 (g) tube ?Discharge Instruction: PERI WOUND-Apply as directed. ?Topical ?Primary Dressing ?Hydrofera Blue Ready Transfer Foam, 4x5 (in/in) ?Discharge Instruction: Apply Hydrofera Blue Ready to wound bed as directed ?Secondary Dressing ?Xtrasorb Large  6x9 (in/in) ?Discharge Instruction: Apply to wound as directed. Do not cut. ?Secured With ?Compression Wrap ?3-LAYER WRAP - Profore Lite LF 3 Multilayer Compression Bandaging System ?ABBIE, BERLING ([id number]) ?Discharge Instruct

## 2022-01-24 NOTE — Telephone Encounter (Signed)
Lvm asking pt to call back.  Dr. Synthia Innocent schedule is full, where he has already added on pt's up to 01/31/22.  However, pt can see another available provider.  ?

## 2022-01-25 ENCOUNTER — Other Ambulatory Visit: Payer: Self-pay

## 2022-01-25 ENCOUNTER — Encounter: Payer: Self-pay | Admitting: Internal Medicine

## 2022-01-25 ENCOUNTER — Ambulatory Visit (INDEPENDENT_AMBULATORY_CARE_PROVIDER_SITE_OTHER): Payer: Medicare Other | Admitting: Internal Medicine

## 2022-01-25 DIAGNOSIS — L03116 Cellulitis of left lower limb: Secondary | ICD-10-CM | POA: Insufficient documentation

## 2022-01-25 DIAGNOSIS — L03119 Cellulitis of unspecified part of limb: Secondary | ICD-10-CM | POA: Diagnosis not present

## 2022-01-25 DIAGNOSIS — G3184 Mild cognitive impairment, so stated: Secondary | ICD-10-CM

## 2022-01-25 MED ORDER — CEFTRIAXONE SODIUM 1 G IJ SOLR
1.0000 g | Freq: Once | INTRAMUSCULAR | Status: AC
  Administered 2022-01-25: 1 g via INTRAMUSCULAR

## 2022-01-25 MED ORDER — LEVOFLOXACIN 500 MG PO TABS: 500.0000 mg | ORAL_TABLET | Freq: Every day | ORAL | 0 refills | Status: DC

## 2022-01-25 NOTE — Addendum Note (Signed)
Addended by: Brenton Grills on: 0/76/1518 34:37 PM ? ? Modules accepted: Orders ? ?

## 2022-01-25 NOTE — Assessment & Plan Note (Signed)
Brief improvement off steroids--but now recurrence of some concerning symptoms ?Is scheduled for neuropsychiatric testing ?Discussed possible progression---should avoid night driving, get help with finances and POA, etc ?

## 2022-01-25 NOTE — Assessment & Plan Note (Signed)
Stepped on a tack in her home 2 days ago ?Increased swelling yesterday and now pain and unable to bear weight ?Wasn't really able to check the area ? ?Clearly has infection ?Did have resistant Acinetobactor from wound on other foot 2 months ago (though it has been looking better) ? ?Will give rocephin 1gm IM now ?levaquin '500mg'$  ---start tonight ?Recheck tomorrow ?

## 2022-01-25 NOTE — Progress Notes (Signed)
? ?Subjective:  ? ? Patient ID: [name], female    DOB: Dec 22, 1945, 76 y.o.   MRN: [id number] ? ?HPI ?Here due to concerns again about her left leg and memory ? 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 Just pulled it out----?2 days ago ?Some swelling noted yesterday ?Then this morning was painful and couldn't put weight on it ? ?Supposed to take lasix daily--but usually doesn't  ?Thinks she skipped the day before ? ?With recent cognitive problems---trouble remembering all her medications ?Did just buy a new pill container with AM/PM sections that will fit all her pills ? ?Current Outpatient Medications on File Prior to Visit  ?Medication Sig Dispense Refill  ? acetaminophen (TYLENOL) 650 MG CR tablet Take 2 tablets (1,300 mg total) by mouth 2 (two) times daily as needed for pain.    ? albuterol (VENTOLIN HFA) 108 (90 Base) MCG/ACT inhaler INHALE 2 PUFFS BY MOUTH EVERY 6 HOURS AS NEEDED FOR WHEEZING/SHORTNESS OF BREATH 8.5 each 0  ? aspirin EC 81 MG tablet Take 81 mg by mouth at bedtime. Melanoma prevention    ? furosemide (LASIX) 40 MG tablet TAKE 1 TABLET 2 (TWO) TIMES DAILY AS NEEDED FOR FLUID. TAKE SECOND DOSE IF NEEDED FOR LEG SWELLING 60 tablet 1  ? KLOR-CON M20 20 MEQ tablet TAKE 1 TABLET BY MOUTH TWICE A DAY 60 tablet 3  ? loratadine (CLARITIN) 10 MG tablet Take 10 mg by mouth daily.    ? losartan (COZAAR) 50 MG tablet Take 1 tablet (50 mg total) by mouth daily. 90 tablet 1  ? montelukast (SINGULAIR) 10 MG tablet TAKE 1 TABLET BY MOUTH EVERY DAY 90 tablet 1  ? Multiple Vitamin (MULTIVITAMIN WITH MINERALS) TABS tablet Take 1 tablet by mouth daily.     ? Polyvinyl Alcohol-Povidone (TEARS PLUS OP) Place 1 drop into both eyes daily as needed (dry eyes/ redness/ burning).     ? PRESCRIPTION MEDICATION CPAP    ? Probiotic Product (PROBIOTIC DAILY PO) Take 1 tablet by mouth daily.     ? triamcinolone (NASACORT) 55 MCG/ACT AERO nasal inhaler Place 2 sprays into the nose daily. In each nostril    ? UNABLE TO FIND Take by mouth.    ? Vitamin D, Ergocalciferol, (DRISDOL) 1.25 MG (50000 UNIT) CAPS capsule TAKE 1 CAPSULE BY MOUTH EVERY 7 DAYS 12 capsule 3  ? ?No current facility-administered medications on file prior to visit.  ? ? ?Allergies  ?Allergen Reactions  ? Sulfa Antibiotics Itching, Swelling and Shortness Of Breath  ?  Facial swelling  ? Voltaren [Diclofenac Sodium] Shortness Of Breath  ? Latex Other (See Comments)  ?  Blisters ?ONLY HAD REACTION TO TAPE  (??? ONLY ADHESIVE ALLERGY)  ? Tape Other (See Comments)  ?  Caused blisters - must use paper tape - same reaction from NeoPrene  ? Doxycycline Other (See Comments)  ?  Diaphoresis and dizziness  ? Other   ?  Neoprene  ? ? ?Past Medical History:  ?Diagnosis Date  ? Allergy   ? Anesthesia complication   ? trouble waking up 2/2 CPAP  ? Arthritis   ?  Asthma in adult   ? Cataract 2019  ? left eye resolved with surgery  ? Cervical spondylosis 2012  ? Jacelyn Grip, [place])  ? Choroidal nevus of left eye 03/22/2016  ? Chronic venous insufficiency 2016  ? severe reflux with painful varicosities sees [facility]  ? Clotting disorder ([place])   ? due to vein ablation   ? Cognitive dysfunction   ? Complication of anesthesia   ? difficulty coming out due to sleep apnea per pt  ? Difficult intubation   ? PATIENT DENIES   ? Diverticulosis   ? per ct scans 09-2016 and 01-2017  ? DVT (deep venous thrombosis) ([place]) 2011  ? small, developed after venous ablation  ? DVT (deep venous thrombosis) ([place])   ? Family history of adverse reaction to anesthesia   ? O2 levels drop upon waking   ? Hearing loss sensory, bilateral 2013  ? mod-severe high freq  sensorineural ([facility])  ? Hiatal hernia   ?  09-2016 ct scan HP at cancer center   ? History of kidney stones 1980s  ? History of radiation therapy 07/19/11-08/25/2011  ? RIGHT AXILLARY REGION/METASTATIC  ? Hypertension   ? Melanoma ([place]) 06/29/2010  ? MALIGNANT MELANOMA R SHOULDER/SUPRASCAPULAR BACK s/p interferon chemo and XRT  ? Neuromuscular disorder ([place])   ? muscle spasms  ? OSA (obstructive sleep apnea)   ? on CPAP  ? Pneumonia   ? 2001  ? RLS (restless legs syndrome)   ? Seasonal allergies   ? Sleep apnea   ? wears cpap  ? Tinnitus   ? Unspecified vitamin D deficiency 03/18/2013  ? Varicose veins of left lower extremity   ? ? ?Past Surgical History:  ?Procedure Laterality Date  ? ABI  2016  ? WNL  ? ANTERIOR CERVICAL DECOMP/DISCECTOMY FUSION N/A 08/22/2015  ? ANTERIOR CERVICAL DECOMPRESSION FUSION C3/4 - interbody graft and anterior plate; Kristeen Miss, MD  ? ANTERIOR CERVICAL DECOMP/DISCECTOMY FUSION N/A 07/02/2016  ? Procedure: Cervical four- five Cervical five- six Cervical six- seven Anterior cervical decompression/diskectomy/fusion;  Surgeon: Kristeen Miss, MD;  Location: [facility];  Service: Neurosurgery;  Laterality: N/A;  C4-5 C5-6 C6-7 Anterior cervical decompression/diskectomy/fusion  ? BREAST BIOPSY Left 2013  ? BENIGN  ? BREAST SURGERY Right 1990  ? BX   ? CARDIOVASCULAR STRESS TEST  2010  ? normal stress test, EF 66%  ? CATARACT EXTRACTION W/ INTRAOCULAR LENS IMPLANT Left 2019  ? Dr. Kathrin Penner  ? COLONOSCOPY  03/2005  ? benign polyp, int hem, rpt 5 yrs ([place], Margart Sickles)  ? COLONOSCOPY WITH PROPOFOL N/A 01/06/2018  ? diverticulosis, decreased sphincter tone, no f/u needed [name], Kirke Corin, MD)  ? DEEP AXILLARY SENTINEL NODE BIOPSY / EXCISION  2012  ? RIGHT  ? DEXA  06/2016  ? WNL  ? DILATION AND CURETTAGE OF UTERUS  2010  ? ENDOVENOUS ABLATION SAPHENOUS VEIN W/ LASER Left 2010  ? [facility]  ? ENDOVENOUS ABLATION SAPHENOUS VEIN W/ LASER Left 2016  ? accessory branch GSV Kellie Simmering)  ?  ESOPHAGOGASTRODUODENOSCOPY  2006  ? mod chronic carditis, no H pylori ([place], Margart Sickles)  ? INTRAVASCULAR ULTRASOUND/IVUS N/A 08/03/2019  ? Waynetta Sandy, MD  ? [place]  ? left  ? LOWER EXTREMITY VENOGRAPHY Left 08/03/2019  ? Waynetta Sandy, MD  ? LUMBAR LAMINECTOMY  2001  ? L4/5  ? MELANOMA EXCISION  2011  ? Right shoulder, with sentinel lymph node biopsy  ? PERIPHERAL VASCULAR INTERVENTION Left  08/03/2019  ? Stent of left common iliac vein with 14 x 60 mm Vici Donzetta Matters, [place] Dom, MD)  ? PORT-A-CATH REMOVAL  12/05/2011  ? Procedure: REMOVAL PORT-A-CATH;  Surgeon: Rolm Bookbinder, MD;  Location: [facility];  Service: General;  Laterality: N/A;  left port removal  ? PORTACATH PLACEMENT    ? left subclavian  ? UPPER GASTROINTESTINAL ENDOSCOPY    ? ? ?Family History  ?Problem Relation Age of Onset  ? Cancer Mother 49  ?     uterine  ? CAD Father 95  ?     MI  ? Hypertension Father 3  ? Alzheimer's disease Father 58  ? Heart attack Father   ? Diabetes Sister   ? Cancer Paternal Grandmother   ?     melanoma, possibly  ? Cancer Cousin   ?     x2, breast  ? Colon cancer Neg Hx   ? Colon polyps Neg Hx   ? Rectal cancer Neg Hx   ? Stomach cancer Neg Hx   ? ? ?Social History  ? ?Socioeconomic History  ? Marital status: Divorced  ?  Spouse name: Not on file  ? Number of children: 0  ? Years of education: Not on file  ? Highest education level: Some college, no degree  ?Occupational History  ? Occupation: retired  ?  Employer: RETIRED  ?  Comment: Government social research officer   ?Tobacco Use  ? Smoking status: Former  ?  Types: Cigarettes  ?  Quit date: 11/05/1965  ?  Years since quitting: 56.2  ? Smokeless tobacco: Never  ? Tobacco comments:  ?  socially as a teen  ?Vaping Use  ? Vaping Use: Former  ?Substance and Sexual Activity  ? Alcohol use: No  ?  Alcohol/week: 0.0 standard drinks  ? Drug use: No  ? Sexual activity: Never  ?Other Topics Concern  ? Not on file  ?Social History  Narrative  ? 01/04/22 Lives alone.  Sister and husband live next door, Lives in [place].  ? 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  ? Edu: some college  ? Diet: poor - good water, rare fruits/ve

## 2022-01-26 ENCOUNTER — Emergency Department: Payer: Medicare Other

## 2022-01-26 ENCOUNTER — Other Ambulatory Visit: Payer: Self-pay

## 2022-01-26 ENCOUNTER — Ambulatory Visit (INDEPENDENT_AMBULATORY_CARE_PROVIDER_SITE_OTHER): Payer: Medicare Other | Admitting: Internal Medicine

## 2022-01-26 ENCOUNTER — Inpatient Hospital Stay
Admission: EM | Admit: 2022-01-26 | Discharge: 2022-01-29 | DRG: 603 | Disposition: A | Discharge status: Skilled Nursing Facility | Payer: Medicare Other | Source: Ambulatory Visit | Attending: Internal Medicine | Admitting: Internal Medicine

## 2022-01-26 ENCOUNTER — Encounter: Payer: Self-pay | Admitting: Internal Medicine

## 2022-01-26 DIAGNOSIS — I10 Essential (primary) hypertension: Secondary | ICD-10-CM | POA: Diagnosis present

## 2022-01-26 DIAGNOSIS — Z86718 Personal history of other venous thrombosis and embolism: Secondary | ICD-10-CM

## 2022-01-26 DIAGNOSIS — I890 Lymphedema, not elsewhere classified: Secondary | ICD-10-CM | POA: Diagnosis present

## 2022-01-26 DIAGNOSIS — Z882 Allergy status to sulfonamides status: Secondary | ICD-10-CM

## 2022-01-26 DIAGNOSIS — M7989 Other specified soft tissue disorders: Secondary | ICD-10-CM

## 2022-01-26 DIAGNOSIS — Z91138 Patient's unintentional underdosing of medication regimen for other reason: Secondary | ICD-10-CM

## 2022-01-26 DIAGNOSIS — E876 Hypokalemia: Secondary | ICD-10-CM | POA: Diagnosis present

## 2022-01-26 DIAGNOSIS — Z87442 Personal history of urinary calculi: Secondary | ICD-10-CM

## 2022-01-26 DIAGNOSIS — G4733 Obstructive sleep apnea (adult) (pediatric): Secondary | ICD-10-CM | POA: Diagnosis not present

## 2022-01-26 DIAGNOSIS — J45909 Unspecified asthma, uncomplicated: Secondary | ICD-10-CM | POA: Diagnosis present

## 2022-01-26 DIAGNOSIS — L03116 Cellulitis of left lower limb: Secondary | ICD-10-CM | POA: Diagnosis not present

## 2022-01-26 DIAGNOSIS — Z23 Encounter for immunization: Secondary | ICD-10-CM

## 2022-01-26 DIAGNOSIS — L0390 Cellulitis, unspecified: Secondary | ICD-10-CM | POA: Diagnosis present

## 2022-01-26 DIAGNOSIS — Z85820 Personal history of malignant melanoma of skin: Secondary | ICD-10-CM

## 2022-01-26 DIAGNOSIS — Z9989 Dependence on other enabling machines and devices: Secondary | ICD-10-CM

## 2022-01-26 DIAGNOSIS — Z981 Arthrodesis status: Secondary | ICD-10-CM

## 2022-01-26 DIAGNOSIS — Z8249 Family history of ischemic heart disease and other diseases of the circulatory system: Secondary | ICD-10-CM

## 2022-01-26 DIAGNOSIS — L03119 Cellulitis of unspecified part of limb: Secondary | ICD-10-CM | POA: Diagnosis not present

## 2022-01-26 DIAGNOSIS — Z91040 Latex allergy status: Secondary | ICD-10-CM

## 2022-01-26 DIAGNOSIS — R9431 Abnormal electrocardiogram [ECG] [EKG]: Secondary | ICD-10-CM | POA: Diagnosis not present

## 2022-01-26 DIAGNOSIS — T503X6A Underdosing of electrolytic, caloric and water-balance agents, initial encounter: Secondary | ICD-10-CM | POA: Diagnosis present

## 2022-01-26 DIAGNOSIS — Z820 Family history of epilepsy and other diseases of the nervous system: Secondary | ICD-10-CM

## 2022-01-26 DIAGNOSIS — E559 Vitamin D deficiency, unspecified: Secondary | ICD-10-CM | POA: Diagnosis present

## 2022-01-26 DIAGNOSIS — Z79899 Other long term (current) drug therapy: Secondary | ICD-10-CM

## 2022-01-26 DIAGNOSIS — Z923 Personal history of irradiation: Secondary | ICD-10-CM

## 2022-01-26 DIAGNOSIS — Z87891 Personal history of nicotine dependence: Secondary | ICD-10-CM

## 2022-01-26 DIAGNOSIS — Z9221 Personal history of antineoplastic chemotherapy: Secondary | ICD-10-CM

## 2022-01-26 DIAGNOSIS — Z888 Allergy status to other drugs, medicaments and biological substances status: Secondary | ICD-10-CM

## 2022-01-26 DIAGNOSIS — Z881 Allergy status to other antibiotic agents status: Secondary | ICD-10-CM

## 2022-01-26 DIAGNOSIS — Z7982 Long term (current) use of aspirin: Secondary | ICD-10-CM

## 2022-01-26 DIAGNOSIS — I872 Venous insufficiency (chronic) (peripheral): Secondary | ICD-10-CM | POA: Diagnosis present

## 2022-01-26 DIAGNOSIS — Z833 Family history of diabetes mellitus: Secondary | ICD-10-CM

## 2022-01-26 LAB — COMPREHENSIVE METABOLIC PANEL
ALT: 24 U/L (ref 0–44)
AST: 17 U/L (ref 15–41)
Albumin: 3.5 g/dL (ref 3.5–5.0)
Alkaline Phosphatase: 48 U/L (ref 38–126)
Anion gap: 10 (ref 5–15)
BUN: 12 mg/dL (ref 8–23)
CO2: 33 mmol/L — ABNORMAL HIGH (ref 22–32)
Calcium: 9.1 mg/dL (ref 8.9–10.3)
Chloride: 97 mmol/L — ABNORMAL LOW (ref 98–111)
Creatinine, Ser: 0.67 mg/dL (ref 0.44–1.00)
GFR, Estimated: >60 mL/min (ref >60)
Glucose, Bld: 102 mg/dL — ABNORMAL HIGH (ref 70–99)
Potassium: 2.5 mmol/L — CL (ref 3.5–5.1)
Sodium: 140 mmol/L (ref 135–145)
Total Bilirubin: 1.2 mg/dL (ref 0.3–1.2)
Total Protein: 7.4 g/dL (ref 6.5–8.1)

## 2022-01-26 LAB — CBC WITH DIFFERENTIAL/PLATELET
Abs Immature Granulocytes: 0.02 10*3/uL (ref 0.00–0.07)
Basophils Absolute: 0 10*3/uL (ref 0.0–0.1)
Basophils Relative: 0 %
Eosinophils Absolute: 0 10*3/uL (ref 0.0–0.5)
Eosinophils Relative: 1 %
HCT: 37.7 % (ref 36.0–46.0)
Hemoglobin: 11.9 g/dL — ABNORMAL LOW (ref 12.0–15.0)
Immature Granulocytes: 0 %
Lymphocytes Relative: 10 %
Lymphs Abs: 0.8 10*3/uL (ref 0.7–4.0)
MCH: 28.3 pg (ref 26.0–34.0)
MCHC: 31.6 g/dL (ref 30.0–36.0)
MCV: 89.5 fL (ref 80.0–100.0)
Monocytes Absolute: 0.7 10*3/uL (ref 0.1–1.0)
Monocytes Relative: 9 %
Neutro Abs: 6.5 10*3/uL (ref 1.7–7.7)
Neutrophils Relative %: 80 %
Platelets: 198 10*3/uL (ref 150–400)
RBC: 4.21 MIL/uL (ref 3.87–5.11)
RDW: 14.1 % (ref 11.5–15.5)
WBC: 8.2 10*3/uL (ref 4.0–10.5)
nRBC: 0 % (ref 0.0–0.2)

## 2022-01-26 LAB — MAGNESIUM: Magnesium: 1.9 mg/dL (ref 1.7–2.4)

## 2022-01-26 LAB — C-REACTIVE PROTEIN: CRP: 9.7 mg/dL — ABNORMAL HIGH (ref <1.0)

## 2022-01-26 LAB — SEDIMENTATION RATE: Sed Rate: 39 mm/hr — ABNORMAL HIGH (ref 0–30)

## 2022-01-26 LAB — LACTIC ACID, PLASMA: Lactic Acid, Venous: 1.6 mmol/L (ref 0.5–1.9)

## 2022-01-26 MED ORDER — ADULT MULTIVITAMIN W/MINERALS CH
1.0000 | ORAL_TABLET | Freq: Every day | ORAL | Status: DC
  Administered 2022-01-26 – 2022-01-29 (×4): 1 via ORAL
  Filled 2022-01-26 (×4): qty 1

## 2022-01-26 MED ORDER — INFLUENZA VAC A&B SA ADJ QUAD 0.5 ML IM PRSY
0.5000 mL | PREFILLED_SYRINGE | INTRAMUSCULAR | Status: DC
  Filled 2022-01-26: qty 0.5

## 2022-01-26 MED ORDER — ACETAMINOPHEN 325 MG PO TABS
650.0000 mg | ORAL_TABLET | Freq: Four times a day (QID) | ORAL | Status: DC | PRN
  Administered 2022-01-28: 650 mg via ORAL
  Filled 2022-01-26: qty 2

## 2022-01-26 MED ORDER — MONTELUKAST SODIUM 10 MG PO TABS
10.0000 mg | ORAL_TABLET | Freq: Every day | ORAL | Status: DC
  Administered 2022-01-26 – 2022-01-29 (×4): 10 mg via ORAL
  Filled 2022-01-26 (×4): qty 1

## 2022-01-26 MED ORDER — ALBUTEROL SULFATE (2.5 MG/3ML) 0.083% IN NEBU: 3.0000 mL | INHALATION_SOLUTION | RESPIRATORY_TRACT | Status: DC | PRN

## 2022-01-26 MED ORDER — VANCOMYCIN HCL IN DEXTROSE 1-5 GM/200ML-% IV SOLN
1000.0000 mg | Freq: Once | INTRAVENOUS | Status: AC
  Administered 2022-01-26: 1000 mg via INTRAVENOUS
  Filled 2022-01-26: qty 200

## 2022-01-26 MED ORDER — POTASSIUM CHLORIDE CRYS ER 20 MEQ PO TBCR: 40.0000 meq | EXTENDED_RELEASE_TABLET | Freq: Once | ORAL | Status: DC

## 2022-01-26 MED ORDER — ONDANSETRON HCL 4 MG/2ML IJ SOLN: 4.0000 mg | Freq: Three times a day (TID) | INTRAMUSCULAR | Status: DC | PRN

## 2022-01-26 MED ORDER — ASPIRIN EC 81 MG PO TBEC
81.0000 mg | DELAYED_RELEASE_TABLET | Freq: Every day | ORAL | Status: DC
  Administered 2022-01-26 – 2022-01-28 (×3): 81 mg via ORAL
  Filled 2022-01-26 (×3): qty 1

## 2022-01-26 MED ORDER — VANCOMYCIN HCL IN DEXTROSE 1-5 GM/200ML-% IV SOLN
1000.0000 mg | INTRAVENOUS | Status: DC
  Filled 2022-01-26: qty 200

## 2022-01-26 MED ORDER — RISAQUAD PO CAPS
1.0000 | ORAL_CAPSULE | Freq: Every day | ORAL | Status: DC
  Administered 2022-01-27 – 2022-01-29 (×3): 1 via ORAL
  Filled 2022-01-26 (×3): qty 1

## 2022-01-26 MED ORDER — SODIUM CHLORIDE 0.9 % IV SOLN
1.0000 g | INTRAVENOUS | Status: DC
  Administered 2022-01-27: 1 g via INTRAVENOUS
  Filled 2022-01-26 (×2): qty 10

## 2022-01-26 MED ORDER — HYDRALAZINE HCL 20 MG/ML IJ SOLN: 5.0000 mg | INTRAMUSCULAR | Status: DC | PRN

## 2022-01-26 MED ORDER — POTASSIUM CHLORIDE 10 MEQ/100ML IV SOLN
10.0000 meq | INTRAVENOUS | Status: AC
  Administered 2022-01-26 (×2): 10 meq via INTRAVENOUS
  Filled 2022-01-26: qty 100

## 2022-01-26 MED ORDER — LOSARTAN POTASSIUM 50 MG PO TABS
50.0000 mg | ORAL_TABLET | Freq: Every day | ORAL | Status: DC
  Administered 2022-01-26 – 2022-01-29 (×4): 50 mg via ORAL
  Filled 2022-01-26 (×4): qty 1

## 2022-01-26 MED ORDER — OXYCODONE-ACETAMINOPHEN 5-325 MG PO TABS: 1.0000 | ORAL_TABLET | ORAL | Status: DC | PRN

## 2022-01-26 MED ORDER — TETANUS-DIPHTH-ACELL PERTUSSIS 5-2.5-18.5 LF-MCG/0.5 IM SUSY
0.5000 mL | PREFILLED_SYRINGE | Freq: Once | INTRAMUSCULAR | Status: AC
  Administered 2022-01-26: 0.5 mL via INTRAMUSCULAR
  Filled 2022-01-26: qty 0.5

## 2022-01-26 MED ORDER — LORATADINE 10 MG PO TABS
10.0000 mg | ORAL_TABLET | Freq: Every day | ORAL | Status: DC
  Administered 2022-01-26 – 2022-01-29 (×4): 10 mg via ORAL
  Filled 2022-01-26 (×4): qty 1

## 2022-01-26 MED ORDER — ENOXAPARIN SODIUM 60 MG/0.6ML IJ SOSY
0.5000 mg/kg | PREFILLED_SYRINGE | INTRAMUSCULAR | Status: DC
  Administered 2022-01-26 – 2022-01-28 (×3): 45 mg via SUBCUTANEOUS
  Filled 2022-01-26 (×3): qty 0.6

## 2022-01-26 MED ORDER — POTASSIUM CHLORIDE CRYS ER 20 MEQ PO TBCR
40.0000 meq | EXTENDED_RELEASE_TABLET | Freq: Once | ORAL | Status: AC
  Administered 2022-01-26: 40 meq via ORAL
  Filled 2022-01-26: qty 2

## 2022-01-26 MED ORDER — SODIUM CHLORIDE 0.9 % IV SOLN
2.0000 g | Freq: Once | INTRAVENOUS | Status: AC
  Administered 2022-01-26: 2 g via INTRAVENOUS
  Filled 2022-01-26: qty 20

## 2022-01-26 NOTE — Progress Notes (Signed)
? ?Subjective:  ? ? Patient ID: [name], female    DOB: 08/24/1946, 76 y.o.   MRN: [id number] ? ?HPI ?Here for follow up of cellulitis of left foot ? ?Foot is not much better ?Still can't lift leg up much--and the calf swelling is more noticeable ?Did have past DVT years ago--was on warfarin ? ?No chest pain  ?No SOB ? ?Current Outpatient Medications on File Prior to Visit  ?Medication Sig Dispense Refill  ? acetaminophen (TYLENOL) 650 MG CR tablet Take 2 tablets (1,300 mg total) by mouth 2 (two) times daily as needed for pain.    ? albuterol (VENTOLIN HFA) 108 (90 Base) MCG/ACT inhaler INHALE 2 PUFFS BY MOUTH EVERY 6 HOURS AS NEEDED FOR WHEEZING/SHORTNESS OF BREATH 8.5 each 0  ? aspirin EC 81 MG tablet Take 81 mg by mouth at bedtime. Melanoma prevention    ? furosemide (LASIX) 40 MG tablet TAKE 1 TABLET 2 (TWO) TIMES DAILY AS NEEDED FOR FLUID. TAKE SECOND DOSE IF NEEDED FOR LEG SWELLING 60 tablet 1  ? KLOR-CON M20 20 MEQ tablet TAKE 1 TABLET BY MOUTH TWICE A DAY 60 tablet 3  ? levofloxacin (LEVAQUIN) 500 MG tablet Take 1 tablet (500 mg total) by mouth daily. 7 tablet 0  ? loratadine (CLARITIN) 10 MG tablet Take 10 mg by mouth daily.    ? losartan (COZAAR) 50 MG tablet Take 1 tablet (50 mg total) by mouth daily. 90 tablet 1  ? montelukast (SINGULAIR) 10 MG tablet TAKE 1 TABLET BY MOUTH EVERY DAY 90 tablet 1  ? Multiple Vitamin (MULTIVITAMIN WITH MINERALS) TABS tablet Take 1 tablet by mouth daily.    ? Polyvinyl Alcohol-Povidone (TEARS PLUS OP) Place 1 drop into both eyes daily as needed (dry eyes/ redness/ burning).     ? PRESCRIPTION MEDICATION CPAP    ? Probiotic Product (PROBIOTIC DAILY PO) Take 1 tablet by mouth daily.     ? triamcinolone (NASACORT) 55 MCG/ACT AERO nasal inhaler Place 2 sprays into the nose daily. In each nostril    ? UNABLE TO FIND Take by mouth.    ? Vitamin D, Ergocalciferol, (DRISDOL) 1.25 MG (50000 UNIT) CAPS capsule TAKE 1 CAPSULE BY MOUTH EVERY 7 DAYS 12 capsule 3  ? ?No current  facility-administered medications on file prior to visit.  ? ? ?Allergies  ?Allergen Reactions  ? Sulfa Antibiotics Itching, Swelling and Shortness Of Breath  ?  Facial swelling  ? Voltaren [Diclofenac Sodium] Shortness Of Breath  ? Latex Other (See Comments)  ?  Blisters ?ONLY HAD REACTION TO TAPE  (??? ONLY ADHESIVE ALLERGY)  ? Tape Other (See Comments)  ?  Caused blisters - must use paper tape - same reaction from NeoPrene  ? Doxycycline Other (See Comments)  ?  Diaphoresis and dizziness  ? Other   ?  Neoprene  ? ? ?Past Medical History:  ?Diagnosis Date  ? Allergy   ? Anesthesia complication   ? trouble waking up 2/2 CPAP  ? Arthritis   ? Asthma in adult   ? Cataract 2019  ? left eye resolved with surgery  ? Cervical spondylosis 2012  ? Jacelyn Grip, [place])  ? Choroidal nevus of left eye 03/22/2016  ? Chronic venous insufficiency 2016  ? severe reflux with painful varicosities sees [facility]  ? Clotting disorder ([place])   ? due to vein ablation   ? Cognitive dysfunction   ? Complication of anesthesia   ? difficulty coming out due to sleep apnea per  pt  ? Difficult intubation   ? PATIENT DENIES   ? Diverticulosis   ? per ct scans 09-2016 and 01-2017  ? DVT (deep venous thrombosis) ([place]) 2011  ? small, developed after venous ablation  ? DVT (deep venous thrombosis) ([place])   ? Family history of adverse reaction to anesthesia   ? O2 levels drop upon waking   ? Hearing loss sensory, bilateral 2013  ? mod-severe high freq sensorineural ([facility])  ? Hiatal hernia   ?  09-2016 ct scan HP at cancer center   ? History of kidney stones 1980s  ? History of radiation therapy 07/19/11-08/25/2011  ? RIGHT AXILLARY REGION/METASTATIC  ? Hypertension   ? Melanoma ([place]) 06/29/2010  ? MALIGNANT MELANOMA R SHOULDER/SUPRASCAPULAR BACK s/p interferon chemo and XRT  ? Neuromuscular disorder ([place])   ? muscle spasms  ? OSA (obstructive sleep apnea)   ? on CPAP  ? Pneumonia   ? 2001  ? RLS (restless legs syndrome)   ? Seasonal allergies   ? Sleep  apnea   ? wears cpap  ? Tinnitus   ? Unspecified vitamin D deficiency 03/18/2013  ? Varicose veins of left lower extremity   ? ? ?Past Surgical History:  ?Procedure Laterality Date  ? ABI  2016  ? WNL  ? ANTERIOR CERVICAL DECOMP/DISCECTOMY FUSION N/A 08/22/2015  ? ANTERIOR CERVICAL DECOMPRESSION FUSION C3/4 - interbody graft and anterior plate; Kristeen Miss, MD  ? ANTERIOR CERVICAL DECOMP/DISCECTOMY FUSION N/A 07/02/2016  ? Procedure: Cervical four- five Cervical five- six Cervical six- seven Anterior cervical decompression/diskectomy/fusion;  Surgeon: Kristeen Miss, MD;  [facility]: [facility];  Service: Neurosurgery;  Laterality: N/A;  C4-5 C5-6 C6-7 Anterior cervical decompression/diskectomy/fusion  ? BREAST BIOPSY Left 2013  ? BENIGN  ? BREAST SURGERY Right 1990  ? BX   ? CARDIOVASCULAR STRESS TEST  2010  ? normal stress test, EF 66%  ? CATARACT EXTRACTION W/ INTRAOCULAR LENS IMPLANT Left 2019  ? Dr. Kathrin Penner  ? COLONOSCOPY  03/2005  ? benign polyp, int hem, rpt 5 yrs ([place], Margart Sickles)  ? COLONOSCOPY WITH PROPOFOL N/A 01/06/2018  ? diverticulosis, decreased sphincter tone, no f/u needed [name], Kirke Corin, MD)  ? DEEP AXILLARY SENTINEL NODE BIOPSY / EXCISION  2012  ? RIGHT  ? DEXA  06/2016  ? WNL  ? DILATION AND CURETTAGE OF UTERUS  2010  ? ENDOVENOUS ABLATION SAPHENOUS VEIN W/ LASER Left 2010  ? GSV  ? ENDOVENOUS ABLATION SAPHENOUS VEIN W/ LASER Left 2016  ? accessory branch GSV Kellie Simmering)  ? ESOPHAGOGASTRODUODENOSCOPY  2006  ? mod chronic carditis, no H pylori ([place], Margart Sickles)  ? INTRAVASCULAR ULTRASOUND/IVUS N/A 08/03/2019  ? Waynetta Sandy, MD  ? [place]  ? left  ? LOWER EXTREMITY VENOGRAPHY Left 08/03/2019  ? Waynetta Sandy, MD  ? LUMBAR LAMINECTOMY  2001  ? L4/5  ? MELANOMA EXCISION  2011  ? Right shoulder, with sentinel lymph node biopsy  ? PERIPHERAL VASCULAR INTERVENTION Left 08/03/2019  ? Stent of left common iliac vein with 14 x 60 mm Vici Donzetta Matters, [place] Dom,  MD)  ? PORT-A-CATH REMOVAL  12/05/2011  ? Procedure: REMOVAL PORT-A-CATH;  Surgeon: Rolm Bookbinder, MD;  Location: [place];  Service: General;  Laterality: N/A;  left port removal  ? PORTACATH PLACEMENT    ? left subclavian  ? UPPER GASTROINTESTINAL ENDOSCOPY    ? ? ?Family History  ?Problem Relation Age of Onset  ?  Cancer Mother 85  ?     uterine  ? CAD Father 10  ?     MI  ? Hypertension Father 54  ? Alzheimer's disease Father 37  ? Heart attack Father   ? Diabetes Sister   ? Cancer Paternal Grandmother   ?     melanoma, possibly  ? Cancer Cousin   ?     x2, breast  ? Colon cancer Neg Hx   ? Colon polyps Neg Hx   ? Rectal cancer Neg Hx   ? Stomach cancer Neg Hx   ? ? ?Social History  ? ?Socioeconomic History  ? Marital status: Divorced  ?  Spouse name: Not on file  ? Number of children: 0  ? Years of education: Not on file  ? Highest education level: Some college, no degree  ?Occupational History  ? Occupation: retired  ?  Employer: RETIRED  ?  Comment: Government social research officer   ?Tobacco Use  ? Smoking status: Former  ?  Types: Cigarettes  ?  Quit date: 11/05/1965  ?  Years since quitting: 56.2  ?  Passive exposure: Never  ? Smokeless tobacco: Never  ? Tobacco comments:  ?  socially as a teen  ?Vaping Use  ? Vaping Use: Former  ?Substance and Sexual Activity  ? Alcohol use: No  ?  Alcohol/week: 0.0 standard drinks  ? Drug use: No  ? Sexual activity: Never  ?Other Topics Concern  ? Not on file  ?Social History Narrative  ? 01/04/22 Lives alone.  Sister and husband live next door, Lives in [place].  ? 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  ? Edu: some college  ? Diet: poor - good water, rare fruits/vegetables  ? ?Social Determinants of Health  ? ?Financial Resource Strain: Low Risk   ? Difficulty of Paying Living Expenses: Not hard at all  ?Food Insecurity: No Food Insecurity  ? Worried About Charity fundraiser in the Last Year: Never true  ? Ran Out of Food in the Last Year: Never  true  ?Transportation Needs: No Transportation Needs  ? Lack of Transportation (Medical): No  ? Lack of Transportation (Non-Medical): No  ?Physical Activity: Sufficiently Active  ? Days of Exercise per Week

## 2022-01-26 NOTE — Progress Notes (Signed)
PHARMACIST - PHYSICIAN COMMUNICATION ? ?CONCERNING:  Enoxaparin (Lovenox) for DVT Prophylaxis  ? ? ?RECOMMENDATION: ?Patient was prescribed enoxaprin '40mg'$  q24 hours for VTE prophylaxis.  ? Danley Danker Weights  ? 01/26/22 1415  ?Weight: 90.3 kg (199 lb)  ? ? ?Body mass index is 37.6 kg/m?. ? ?Estimated Creatinine Clearance: 62.2 mL/min (by C-G formula based on SCr of 0.67 mg/dL). ? ? ?Based on [place] patient is candidate for enoxaparin 0.'5mg'$ /kg TBW SQ every 24 hours based on BMI being >30. ? ? ?DESCRIPTION: ?Pharmacy has adjusted enoxaparin dose per [facility] policy. ? ?Patient is now receiving enoxaparin 45 mg every 24 hours  ? ? ?Sherilyn Banker, PharmD ?Clinical Pharmacist  ?01/26/2022 ?2:59 PM ? ?

## 2022-01-26 NOTE — H&P (Signed)
?History and Physical  ? ? ?[name] IRC:[id number] DOB: January 08, 1946 DOA: 01/26/2022 ? ?Referring MD/NP/PA:  ? ?PCP: [name], MD  ? ?Patient coming from:  The patient is coming from home.  At baseline, pt is independent for most of ADL.       ? ?Chief Complaint: pain in left lower leg and foot ? ?HPI: [name] is a 76 y.o. female with medical history significant of hypertension, asthma, OSA on CPAP, kidney stone, DVT not on anticoagulants, cognitive impairment, chronic venous insufficiency, who presents with pain in left lower leg and foot. ? ?Pt states that she stepped on a tac on her left foot 4 days ago.  He developed pain and swelling in left foot, which has been spreading up to lower leg, and has been progressively worsening.  Patient was seen by PCP yesterday and prescribed Levaquin after given Rocephin injection.  Patient states that her symptoms have been worsening.  The pain is constant, moderate to severe, sharp, nonradiating.  Patient does not have fever or chills.  She also reports she had cat scratch to left heel 1 week ago, but I cannot find any scratch marker today. Patient does not have chest pain, cough, shortness breath.  No nausea vomiting, diarrhea or abdominal pain.  No symptoms of UTI. ? ?Patient has a history of actinobacter from wound on the other foot 2 months ago. She is got lymphedema wraps on right leg and does not want to be taken down given no symptoms in the right leg. Patient reports that the right leg has been doing well and has been improving. ? ? ?Data Reviewed and ED Course: pt was found to have WBC 8.2, lactic acid 1.0, potassium 2.5, magnesium 1.9, temperature normal, blood pressure 153/77, heart rate 104, 81, RR 18, oxygen saturation 95-97% on room air.  X-ray negative for bony fracture of left foot and left tibia/fibula.  Left lower extremity venous Doppler negative for DVT.  Patient is placed on MedSurg bed for observation ? ? ?EKG: I have personally  reviewed.  Sinus rhythm, QTc 424, LAD, poor R wave progression, anteroseptal infarction pattern ? ? ?Review of Systems:  ? ?General: no fevers, chills, no body weight gain, has fatigue ?HEENT: no blurry vision, hearing changes or sore throat ?Respiratory: no dyspnea, coughing, wheezing ?CV: no chest pain, no palpitations ?GI: no nausea, vomiting, abdominal pain, diarrhea, constipation ?GU: no dysuria, burning on urination, increased urinary frequency, hematuria  ?Ext: has pain and swelling in left leg and foot ?Neuro: no unilateral weakness, numbness, or tingling, no vision change or hearing loss ?Skin: no rash, no skin tear. ?MSK: No muscle spasm, no deformity, no limitation of range of movement in spin ?Heme: No easy bruising.  ?Travel history: No recent long distant travel. ? ? ?Allergy:  ?Allergies  ?Allergen Reactions  ? Sulfa Antibiotics Itching, Swelling and Shortness Of Breath  ?  Facial swelling  ? Voltaren [Diclofenac Sodium] Shortness Of Breath  ? Latex Other (See Comments)  ?  Blisters ?ONLY HAD REACTION TO TAPE  (??? ONLY ADHESIVE ALLERGY)  ? Tape Other (See Comments)  ?  Caused blisters - must use paper tape - same reaction from NeoPrene  ? Doxycycline Other (See Comments)  ?  Diaphoresis and dizziness  ? Other   ?  Neoprene  ? ? ?Past Medical History:  ?Diagnosis Date  ? Allergy   ? Anesthesia complication   ? trouble waking up 2/2 CPAP  ? Arthritis   ?  Asthma in adult   ? Cataract 2019  ? left eye resolved with surgery  ? Cervical spondylosis 2012  ? Jacelyn Grip, [place])  ? Choroidal nevus of left eye 03/22/2016  ? Chronic venous insufficiency 2016  ? severe reflux with painful varicosities sees [facility]  ? Clotting disorder ([place])   ? due to vein ablation   ? Cognitive dysfunction   ? Complication of anesthesia   ? difficulty coming out due to sleep apnea per pt  ? Difficult intubation   ? PATIENT DENIES   ? Diverticulosis   ? per ct scans 09-2016 and 01-2017  ? DVT (deep venous thrombosis) ([place]) 2011  ? small,  developed after venous ablation  ? DVT (deep venous thrombosis) ([place])   ? Family history of adverse reaction to anesthesia   ? O2 levels drop upon waking   ? Hearing loss sensory, bilateral 2013  ? mod-severe high freq sensorineural ([facility])  ? Hiatal hernia   ?  09-2016 ct scan HP at cancer center   ? History of kidney stones 1980s  ? History of radiation therapy 07/19/11-08/25/2011  ? RIGHT AXILLARY REGION/METASTATIC  ? Hypertension   ? Melanoma ([place]) 06/29/2010  ? MALIGNANT MELANOMA R SHOULDER/SUPRASCAPULAR BACK s/p interferon chemo and XRT  ? Neuromuscular disorder ([place])   ? muscle spasms  ? OSA (obstructive sleep apnea)   ? on CPAP  ? Pneumonia   ? 2001  ? RLS (restless legs syndrome)   ? Seasonal allergies   ? Sleep apnea   ? wears cpap  ? Tinnitus   ? Unspecified vitamin D deficiency 03/18/2013  ? Varicose veins of left lower extremity   ? ? ?Past Surgical History:  ?Procedure Laterality Date  ? ABI  2016  ? WNL  ? ANTERIOR CERVICAL DECOMP/DISCECTOMY FUSION N/A 08/22/2015  ? ANTERIOR CERVICAL DECOMPRESSION FUSION C3/4 - interbody graft and anterior plate; Kristeen Miss, MD  ? ANTERIOR CERVICAL DECOMP/DISCECTOMY FUSION N/A 07/02/2016  ? Procedure: Cervical four- five Cervical five- six Cervical six- seven Anterior cervical decompression/diskectomy/fusion;  Surgeon: Kristeen Miss, MD;  Location: [facility];  Service: Neurosurgery;  Laterality: N/A;  C4-5 C5-6 C6-7 Anterior cervical decompression/diskectomy/fusion  ? BREAST BIOPSY Left 2013  ? BENIGN  ? BREAST SURGERY Right 1990  ? BX   ? CARDIOVASCULAR STRESS TEST  2010  ? normal stress test, EF 66%  ? CATARACT EXTRACTION W/ INTRAOCULAR LENS IMPLANT Left 2019  ? Dr. Kathrin Penner  ? COLONOSCOPY  03/2005  ? benign polyp, int hem, rpt 5 yrs ([place], Margart Sickles)  ? COLONOSCOPY WITH PROPOFOL N/A 01/06/2018  ? diverticulosis, decreased sphincter tone, no f/u needed [name], Kirke Corin, MD)  ? DEEP AXILLARY SENTINEL NODE BIOPSY / EXCISION  2012  ? RIGHT  ? DEXA   06/2016  ? WNL  ? DILATION AND CURETTAGE OF UTERUS  2010  ? ENDOVENOUS ABLATION SAPHENOUS VEIN W/ LASER Left 2010  ? GSV  ? ENDOVENOUS ABLATION SAPHENOUS VEIN W/ LASER Left 2016  ? accessory branch GSV Kellie Simmering)  ? ESOPHAGOGASTRODUODENOSCOPY  2006  ? mod chronic carditis, no H pylori ([place], Margart Sickles)  ? INTRAVASCULAR ULTRASOUND/IVUS N/A 08/03/2019  ? Waynetta Sandy, MD  ? [place]  ? left  ? LOWER EXTREMITY VENOGRAPHY Left 08/03/2019  ? Waynetta Sandy, MD  ? LUMBAR LAMINECTOMY  2001  ? L4/5  ? MELANOMA EXCISION  2011  ? Right shoulder, with sentinel lymph node biopsy  ? PERIPHERAL VASCULAR INTERVENTION Left  08/03/2019  ? Stent of left common iliac vein with 14 x 60 mm Vici Donzetta Matters, [place] Dom, MD)  ? PORT-A-CATH REMOVAL  12/05/2011  ? Procedure: REMOVAL PORT-A-CATH;  Surgeon: Rolm Bookbinder, MD;  Location: [place];  Service: General;  Laterality: N/A;  left port removal  ? PORTACATH PLACEMENT    ? left subclavian  ? UPPER GASTROINTESTINAL ENDOSCOPY    ? ? ?Social History:  reports that she quit smoking about 56 years ago. Her smoking use included cigarettes. She has never been exposed to tobacco smoke. She has never used smokeless tobacco. She reports that she does not drink alcohol and does not use drugs. ? ?Family History:  ?Family History  ?Problem Relation Age of Onset  ? Cancer Mother 58  ?     uterine  ? CAD Father 69  ?     MI  ? Hypertension Father 24  ? Alzheimer's disease Father 63  ? Heart attack Father   ? Diabetes Sister   ? Cancer Paternal Grandmother   ?     melanoma, possibly  ? Cancer Cousin   ?     x2, breast  ? Colon cancer Neg Hx   ? Colon polyps Neg Hx   ? Rectal cancer Neg Hx   ? Stomach cancer Neg Hx   ?  ? ?Prior to Admission medications   ?Medication Sig Start Date End Date Taking? Authorizing Provider  ?acetaminophen (TYLENOL) 650 MG CR tablet Take 2 tablets (1,300 mg total) by mouth 2 (two) times daily as needed for pain. 07/05/21    [name], MD  ?albuterol (VENTOLIN HFA) 108 (90 Base) MCG/ACT inhaler INHALE 2 PUFFS BY MOUTH EVERY 6 HOURS AS NEEDED FOR WHEEZING/SHORTNESS OF BREATH 08/18/21   [name], MD  ?aspirin EC 8

## 2022-01-26 NOTE — Progress Notes (Addendum)
Pharmacy Antibiotic Note ? ?[name] is a 76 y.o. female admitted on 01/26/2022 with cellulitis.  Pharmacy has been consulted for vancomycin dosing. ? ?Plan: ?Vancomycin 2 g IV x 1 loading dose followed by 1000 mg every 24 hours ?Est AUC: 521.7 ?Used: Scr 0.8 (rounded up), IBW, Vd 0.5 ?Obtain vanc levels around 4th or 5th dose if continued ?Monitor renal function and adjust dose as clinically indicated  ? ?Height: '5\' 1"'$  (154.9 cm) ?Weight: 90.3 kg (199 lb) ?IBW/kg (Calculated) : 47.8 ? ?Temp (24hrs), Avg:98.3 ?F (36.8 ?C), Min:97.7 ?F (36.5 ?C), Max:98.8 ?F (37.1 ?C) ? ?Recent Labs  ?Lab 01/26/22 ?1238  ?WBC 8.2  ?CREATININE 0.67  ?LATICACIDVEN 1.6  ?  ?Estimated Creatinine Clearance: 62.2 mL/min (by C-G formula based on SCr of 0.67 mg/dL).   ? ?Allergies  ?Allergen Reactions  ? Sulfa Antibiotics Itching, Swelling and Shortness Of Breath  ?  Facial swelling  ? Voltaren [Diclofenac Sodium] Shortness Of Breath  ? Latex Other (See Comments)  ?  Blisters ?ONLY HAD REACTION TO TAPE  (??? ONLY ADHESIVE ALLERGY)  ? Tape Other (See Comments)  ?  Caused blisters - must use paper tape - same reaction from NeoPrene  ? Doxycycline Other (See Comments)  ?  Diaphoresis and dizziness  ? Other   ?  Neoprene  ? ? ?Antimicrobials this admission: ?3/23 ceftriaxone >>  ?3/24 vancomycin >>  ? ? ?Thank you for allowing pharmacy to be a part of this patient?s care. ? ?Forde Dandy Gredmarie Delange ?01/26/2022 3:34 PM ?

## 2022-01-26 NOTE — ED Triage Notes (Signed)
Patient to ER from PCP. Reports stepping on a tac on her left foot several days ago, went to have the wounds assessed and was prescribed antibiotics (rocpehin and levaquin) as well as given an abx shot. Patient went for  follow up visit today and was told that the wound looked worse and that she needed to be seen in the ER. Patient with "resistant organisms" present to wound. ? ?Right foot with previous injury and wrapped\, currently treated by the wound center.  ?

## 2022-01-26 NOTE — ED Provider Notes (Signed)
? ?[facility] ?Provider Note ? ? ? Event Date/Time  ? First MD Initiated Contact with Patient 01/26/22 1230   ?  (approximate) ? ? ?History  ? ?Cellulitis ? ? ?HPI ? ?[name] is a 76 y.o. female with history of lymphedema who comes in with concern for cellulitis by the primary doctor.  Patient stepped on a task at home 2 days ago.  Patient has worsening pain to that area was seen by her primary care doctor and started on Levaquin afternoon dose of Rocephin.  Patient has a history of actinobacter from wound on the other foot 2 months ago.  Patient reports that the right leg has been doing well and has been improving she is got lymphedema wraps on it and does not want to be taken down given no symptoms in the right leg.  However she reports this worsening pain in the left foot.  She does have some redness top of the shin.  Patient was seen by the primary care doctor again today who sent her over given concern that it was not getting better. ? ? ?Physical Exam  ? ?Triage Vital Signs: ?Blood pressure (!) 162/76, pulse 87, temperature 98.8 ?F (37.1 ?C), temperature source Oral, resp. rate 18, height '5\' 1"'$  (1.549 m), SpO2 96 %. ? ? ?Most recent vital signs: ?Vitals:  ? 01/26/22 1233 01/26/22 1236  ?BP: (!) 162/76   ?Pulse: 87   ?Resp: 18   ?Temp:  98.8 ?F (37.1 ?C)  ?SpO2: 96%   ? ? ? ?General: Awake, no distress.  ?CV:  Good peripheral perfusion.  ?Resp:  Normal effort.  ?Abd:  No distention.  ?Other:  Patient has edema in her bilateral legs.  Right leg is in a wrap.  She prefers not to be taken down given no issues with this wrap.  She reports the pain in her left foot underneath the big toe.  No obvious redness noted at this time.  She is tender to palpation.  She is got some redness up on her shin that goes almost to right below the knee down into the foot.  It is warm to touch..  No evidence of septic joint.  2+ distal pulse.  Still little bit of swelling noted of her leg ? ? ?ED Results  / Procedures / Treatments  ? ?Labs ?(all labs ordered are listed, but only abnormal results are displayed) ?Labs Reviewed  ?CBC WITH DIFFERENTIAL/PLATELET - Abnormal; Notable for the following components:  ?    Result Value  ? Hemoglobin 11.9 (*)   ? All other components within normal limits  ?COMPREHENSIVE METABOLIC PANEL - Abnormal; Notable for the following components:  ? Potassium 2.5 (*)   ? Chloride 97 (*)   ? CO2 33 (*)   ? Glucose, Bld 102 (*)   ? All other components within normal limits  ?CULTURE, BLOOD (ROUTINE X 2)  ?CULTURE, BLOOD (ROUTINE X 2)  ?LACTIC ACID, PLASMA  ?LACTIC ACID, PLASMA  ?MAGNESIUM  ? ? ? ?EKG ? ?My interpretation of EKG: ? ?Look sinus rate of 84 without any ST elevation or T wave inversions except for aVL, normal intervals ? ?RADIOLOGY ?I have reviewed the xray personally and no evidence of any fractures or osteo   ? ?Dvt pending  ? ? ?PROCEDURES: ? ?Critical Care performed: No ? ?.1-3 Lead EKG Interpretation ?Performed by: Vanessa Be[place], MD ?Authorized by: Vanessa [place], MD  ? ?  Interpretation: normal   ?  ECG rate:  70 ?  ECG rate assessment: normal   ?  Rhythm: sinus rhythm   ?  Ectopy: none   ?  Conduction: normal   ? ? ?MEDICATIONS ORDERED IN ED: ?Medications  ?potassium chloride SA (KLOR-CON M) CR tablet 40 mEq (has no administration in time range)  ?potassium chloride 10 mEq in 100 mL IVPB (has no administration in time range)  ? ? ? ?IMPRESSION / MDM / ASSESSMENT AND PLAN / ED COURSE  ?I reviewed the triage vital signs and the nursing notes. ?             ?               ? ?Differential diagnosis includes, but is not limited to, cellulitis, foot infection, good distal pulse unlikely arterial issue.  Ultrasound to rule out DVT x-rays without any osteo..  Do not feel that this is necrotizing fasciitis. ? ?CBC is normal.  CMP shows significantly low potassium at 2.5.  We will add on magnesium and get EKG to further evaluate. ? ?DVT ultrasound is negative.  Patient given some IV  potassium and oral potassium.  Given patient's cellulitis failing outpatient trial will discuss with hospital team for admission as well as further correction of the hypokalemia. ? ?Discussed with the hospitalist for admission ? ?The patient is on the cardiac monitor to evaluate for evidence of arrhythmia and/or significant heart rate changes. ? ? ?FINAL CLINICAL IMPRESSION(S) / ED DIAGNOSES  ? ?Final diagnoses:  ?Cellulitis of left lower extremity  ?Hypokalemia  ? ? ? ?Rx / DC Orders  ? ?ED Discharge Orders   ? ? None  ? ?  ? ? ? ?Note:  This document was prepared using Dragon voice recognition software and may include unintentional dictation errors. ?  ?Vanessa [place], MD ?01/26/22 1435 ? ?

## 2022-01-26 NOTE — Assessment & Plan Note (Signed)
Worse despite rocephin and levaquin yesterday ?Will need ER evaluation---brother in law will bring her and I called and gave report to [employer] RN in the ER ? ?Might need to check for DVT in left calf--given worse swelling as well ?

## 2022-01-27 DIAGNOSIS — L03116 Cellulitis of left lower limb: Secondary | ICD-10-CM | POA: Diagnosis present

## 2022-01-27 DIAGNOSIS — L0390 Cellulitis, unspecified: Secondary | ICD-10-CM | POA: Diagnosis present

## 2022-01-27 DIAGNOSIS — I890 Lymphedema, not elsewhere classified: Secondary | ICD-10-CM | POA: Diagnosis present

## 2022-01-27 DIAGNOSIS — E876 Hypokalemia: Secondary | ICD-10-CM | POA: Diagnosis present

## 2022-01-27 DIAGNOSIS — Z881 Allergy status to other antibiotic agents status: Secondary | ICD-10-CM | POA: Diagnosis not present

## 2022-01-27 DIAGNOSIS — Z9221 Personal history of antineoplastic chemotherapy: Secondary | ICD-10-CM | POA: Diagnosis not present

## 2022-01-27 DIAGNOSIS — Z87891 Personal history of nicotine dependence: Secondary | ICD-10-CM | POA: Diagnosis not present

## 2022-01-27 DIAGNOSIS — Z86718 Personal history of other venous thrombosis and embolism: Secondary | ICD-10-CM | POA: Diagnosis not present

## 2022-01-27 DIAGNOSIS — E559 Vitamin D deficiency, unspecified: Secondary | ICD-10-CM | POA: Diagnosis present

## 2022-01-27 DIAGNOSIS — Z923 Personal history of irradiation: Secondary | ICD-10-CM | POA: Diagnosis not present

## 2022-01-27 DIAGNOSIS — I872 Venous insufficiency (chronic) (peripheral): Secondary | ICD-10-CM | POA: Diagnosis present

## 2022-01-27 DIAGNOSIS — Z91040 Latex allergy status: Secondary | ICD-10-CM | POA: Diagnosis not present

## 2022-01-27 DIAGNOSIS — Z882 Allergy status to sulfonamides status: Secondary | ICD-10-CM | POA: Diagnosis not present

## 2022-01-27 DIAGNOSIS — G4733 Obstructive sleep apnea (adult) (pediatric): Secondary | ICD-10-CM | POA: Diagnosis present

## 2022-01-27 DIAGNOSIS — T503X6A Underdosing of electrolytic, caloric and water-balance agents, initial encounter: Secondary | ICD-10-CM | POA: Diagnosis present

## 2022-01-27 DIAGNOSIS — Z888 Allergy status to other drugs, medicaments and biological substances status: Secondary | ICD-10-CM | POA: Diagnosis not present

## 2022-01-27 DIAGNOSIS — Z981 Arthrodesis status: Secondary | ICD-10-CM | POA: Diagnosis not present

## 2022-01-27 DIAGNOSIS — Z79899 Other long term (current) drug therapy: Secondary | ICD-10-CM | POA: Diagnosis not present

## 2022-01-27 DIAGNOSIS — I10 Essential (primary) hypertension: Secondary | ICD-10-CM | POA: Diagnosis present

## 2022-01-27 DIAGNOSIS — Z85820 Personal history of malignant melanoma of skin: Secondary | ICD-10-CM | POA: Diagnosis not present

## 2022-01-27 DIAGNOSIS — Z91138 Patient's unintentional underdosing of medication regimen for other reason: Secondary | ICD-10-CM | POA: Diagnosis not present

## 2022-01-27 DIAGNOSIS — J45909 Unspecified asthma, uncomplicated: Secondary | ICD-10-CM | POA: Diagnosis present

## 2022-01-27 DIAGNOSIS — Z87442 Personal history of urinary calculi: Secondary | ICD-10-CM | POA: Diagnosis not present

## 2022-01-27 DIAGNOSIS — Z7982 Long term (current) use of aspirin: Secondary | ICD-10-CM | POA: Diagnosis not present

## 2022-01-27 DIAGNOSIS — Z23 Encounter for immunization: Secondary | ICD-10-CM | POA: Diagnosis present

## 2022-01-27 LAB — CBC
HCT: 35.8 % — ABNORMAL LOW (ref 36.0–46.0)
Hemoglobin: 11.4 g/dL — ABNORMAL LOW (ref 12.0–15.0)
MCH: 28.3 pg (ref 26.0–34.0)
MCHC: 31.8 g/dL (ref 30.0–36.0)
MCV: 88.8 fL (ref 80.0–100.0)
Platelets: 193 10*3/uL (ref 150–400)
RBC: 4.03 MIL/uL (ref 3.87–5.11)
RDW: 14.4 % (ref 11.5–15.5)
WBC: 7.5 10*3/uL (ref 4.0–10.5)
nRBC: 0 % (ref 0.0–0.2)

## 2022-01-27 LAB — BASIC METABOLIC PANEL
Anion gap: 8 (ref 5–15)
BUN: 12 mg/dL (ref 8–23)
CO2: 32 mmol/L (ref 22–32)
Calcium: 8.5 mg/dL — ABNORMAL LOW (ref 8.9–10.3)
Chloride: 99 mmol/L (ref 98–111)
Creatinine, Ser: 0.64 mg/dL (ref 0.44–1.00)
GFR, Estimated: >60 mL/min (ref >60)
Glucose, Bld: 136 mg/dL — ABNORMAL HIGH (ref 70–99)
Potassium: 2.4 mmol/L — CL (ref 3.5–5.1)
Sodium: 139 mmol/L (ref 135–145)

## 2022-01-27 LAB — POTASSIUM: Potassium: 3.8 mmol/L (ref 3.5–5.1)

## 2022-01-27 LAB — PHOSPHORUS: Phosphorus: 3.1 mg/dL (ref 2.5–4.6)

## 2022-01-27 MED ORDER — POTASSIUM CHLORIDE CRYS ER 20 MEQ PO TBCR
40.0000 meq | EXTENDED_RELEASE_TABLET | Freq: Once | ORAL | Status: AC
  Administered 2022-01-27: 40 meq via ORAL
  Filled 2022-01-27: qty 2

## 2022-01-27 MED ORDER — POTASSIUM CHLORIDE 10 MEQ/100ML IV SOLN
10.0000 meq | INTRAVENOUS | Status: DC
  Administered 2022-01-27 (×4): 10 meq via INTRAVENOUS
  Filled 2022-01-27 (×4): qty 100

## 2022-01-27 MED ORDER — POTASSIUM CHLORIDE 20 MEQ PO PACK
40.0000 meq | PACK | Freq: Once | ORAL | Status: AC
  Administered 2022-01-27: 20 meq via ORAL
  Filled 2022-01-27: qty 2

## 2022-01-27 MED ORDER — POTASSIUM CHLORIDE 10 MEQ/100ML IV SOLN: 10.0000 meq | INTRAVENOUS | Status: DC

## 2022-01-27 MED ORDER — POTASSIUM CHLORIDE CRYS ER 20 MEQ PO TBCR
20.0000 meq | EXTENDED_RELEASE_TABLET | Freq: Once | ORAL | Status: AC
  Administered 2022-01-27: 20 meq via ORAL
  Filled 2022-01-27: qty 1

## 2022-01-27 MED ORDER — POTASSIUM CHLORIDE 20 MEQ PO PACK: 40.0000 meq | PACK | ORAL | Status: DC

## 2022-01-27 MED ORDER — ALBUTEROL SULFATE (2.5 MG/3ML) 0.083% IN NEBU: 2.5000 mg | INHALATION_SOLUTION | Freq: Four times a day (QID) | RESPIRATORY_TRACT | Status: DC | PRN

## 2022-01-27 NOTE — Consult Note (Signed)
PHARMACY CONSULT NOTE - FOLLOW UP ? ?Pharmacy Consult for Electrolyte Monitoring and Replacement  ? ?Recent Labs: ?Potassium  ?Date Value  ?01/27/2022 3.8 mmol/L  ?08/15/2017 4.1 mEq/L  ?09/17/2016 3.5 mEq/L  ? ?Magnesium (mg/dL)  ?Date Value  ?01/26/2022 1.9  ? ?Calcium (mg/dL)  ?Date Value  ?01/27/2022 8.5 (L)  ?08/15/2017 9.7  ?09/17/2016 9.8  ? ?Albumin (g/dL)  ?Date Value  ?01/26/2022 3.5  ?08/15/2017 4.0  ?09/17/2016 3.6  ? ?Phosphorus (mg/dL)  ?Date Value  ?01/27/2022 3.1  ? ?Sodium  ?Date Value  ?01/27/2022 139 mmol/L  ?08/15/2017 149 mEq/L (H)  ?09/17/2016 143 mEq/L  ? ? ?Assessment: ?76 y.o. female with medical history significant of hypertension, asthma, OSA on CPAP, kidney stone, DVT not on anticoagulants, cognitive impairment, chronic venous insufficiency, who presents with pain in left lower leg and foot following stepping on  a thumb tac 4 days prior. Noted to have hypokalemia. Pharmacy has been consulted to monitor and replace electrolytes  ? ?Goal of Therapy:  ?Electrolytes WNL ? ?Plan:  ?Potassium WNL ?Will recheck electrolytes with AM labs ? ?Darrick Penna, PharmD, MS PGPM ?Clinical Pharmacist ?01/27/2022 ?8:37 PM ? ?

## 2022-01-27 NOTE — Consult Note (Signed)
PHARMACY CONSULT NOTE - FOLLOW UP ? ?Pharmacy Consult for Electrolyte Monitoring and Replacement  ? ?Recent Labs: ?Potassium  ?Date Value  ?01/27/2022 2.4 mmol/L (LL)  ?08/15/2017 4.1 mEq/L  ?09/17/2016 3.5 mEq/L  ? ?Magnesium (mg/dL)  ?Date Value  ?01/26/2022 1.9  ? ?Calcium (mg/dL)  ?Date Value  ?01/27/2022 8.5 (L)  ?08/15/2017 9.7  ?09/17/2016 9.8  ? ?Albumin (g/dL)  ?Date Value  ?01/26/2022 3.5  ?08/15/2017 4.0  ?09/17/2016 3.6  ? ?Phosphorus (mg/dL)  ?Date Value  ?01/27/2022 3.1  ? ?Sodium  ?Date Value  ?01/27/2022 139 mmol/L  ?08/15/2017 149 mEq/L (H)  ?09/17/2016 143 mEq/L  ? ? ?Assessment: ?76 y.o. female with medical history significant of hypertension, asthma, OSA on CPAP, kidney stone, DVT not on anticoagulants, cognitive impairment, chronic venous insufficiency, who presents with pain in left lower leg and foot following stepping on  a thumb tac 4 days prior. Noted to have hypokalemia. Pharmacy has been consulted to monitor and replace electrolytes  ? ?Goal of Therapy:  ?Electrolytes WNL ? ?Plan:  ?Hypokalemia: 2.4 ?Patient not tolerating IV Kcl ,will continue with PO Kcl replacement 40 mEq x 2 ordered.  ?Repeat K+ tonight at 2000  ?Recheck all other electrolytes  with AM labs  ? ?Dorothe Pea, PharmD, BCPS ?Clinical Pharmacist   ?01/27/2022 1:06 PM  ?

## 2022-01-27 NOTE — Plan of Care (Signed)
?  Problem: Clinical Measurements: ?Goal: Ability to avoid or minimize complications of infection will improve ?Outcome: Progressing ?  ?Problem: Skin Integrity: ?Goal: Skin integrity will improve ?Outcome: Progressing ?  ?

## 2022-01-27 NOTE — Progress Notes (Signed)
[place] at [facility] ? ? ?PATIENT NAME: [name]   ? ?MR#:  [id number] ? ?DATE OF BIRTH:  1946-10-02 ? ?SUBJECTIVE:  ?patient sitting out in the recliner chair. Feels better. Right lower extremity redness improved. No fever. Complains of IV potassium burning at IV site. ?No family at bedside ? ?VITALS:  ?Blood pressure (!) 156/74, pulse 65, temperature 98.1 ?F (36.7 ?C), resp. rate 17, height '5\' 1"'$  (1.549 m), weight 90.3 kg, SpO2 99 %. ? ?PHYSICAL EXAMINATION:  ? ?GENERAL:  76 y.o.-year-old patient lying in the bed with no acute distress.  ?LUNGS: Normal breath sounds bilaterally, no wheezing, rales, rhonchi.  ?CARDIOVASCULAR: S1, S2 normal. No murmurs, rubs, or gallops.  ?ABDOMEN: Soft, nontender, nondistended. Bowel sounds present.  ?EXTREMITIES:  ?   ?NEUROLOGIC: nonfocal  patient is alert and awake ?SKIN:as above ?LABORATORY PANEL:  ?CBC ?Recent Labs  ?Lab 01/27/22 ?3299  ?WBC 7.5  ?HGB 11.4*  ?HCT 35.8*  ?PLT 193  ? ? ?Chemistries  ?Recent Labs  ?Lab 01/26/22 ?1238 01/26/22 ?1344 01/27/22 ?2426  ?NA 140  --  139  ?K 2.5*  --  2.4*  ?CL 97*  --  99  ?CO2 33*  --  32  ?GLUCOSE 102*  --  136*  ?BUN 12  --  12  ?CREATININE 0.67  --  0.64  ?CALCIUM 9.1  --  8.5*  ?MG  --  1.9  --   ?AST 17  --   --   ?ALT 24  --   --   ?ALKPHOS 48  --   --   ?BILITOT 1.2  --   --   ? ?Cardiac Enzymes ?No results for input(s): TROPONINI in the last 168 hours. ?RADIOLOGY:  ?DG Tibia/Fibula Left ? ?Result Date: 01/26/2022 ?CLINICAL DATA:  Redness EXAM: LEFT TIBIA AND FIBULA - 2 VIEW COMPARISON:  None. FINDINGS: No acute fracture or dislocation identified. No bone destruction or periosteal reaction. Vascular calcifications noted. IMPRESSION: No acute osseous abnormality identified. Electronically Signed   By: Ofilia Neas M.D.   On: 01/26/2022 14:14  ? ?US Venous Img Lower Unilateral Left (DVT) ? ?Result Date: 01/26/2022 ?CLINICAL DATA:  Swelling and redness of lower legs. EXAM: LEFT LOWER EXTREMITY  VENOUS DOPPLER ULTRASOUND TECHNIQUE: Gray-scale sonography with graded compression, as well as color Doppler and duplex ultrasound were performed to evaluate the lower extremity deep venous systems from the level of the common femoral vein and including the common femoral, femoral, profunda femoral, popliteal and calf veins including the posterior tibial, peroneal and gastrocnemius veins when visible. The superficial great saphenous vein was also interrogated. Spectral Doppler was utilized to evaluate flow at rest and with distal augmentation maneuvers in the common femoral, femoral and popliteal veins. COMPARISON:  08/15/2017 FINDINGS: Contralateral Common Femoral Vein: Respiratory phasicity is normal and symmetric with the symptomatic side. No evidence of thrombus. Normal compressibility. Common Femoral Vein: No evidence of thrombus. Normal compressibility, respiratory phasicity and response to augmentation. Saphenofemoral Junction: No evidence of thrombus. Normal compressibility and flow on color Doppler imaging. Profunda Femoral Vein: No evidence of thrombus. Normal compressibility and flow on color Doppler imaging. Femoral Vein: No evidence of thrombus. Normal compressibility, respiratory phasicity and response to augmentation. Popliteal Vein: No evidence of thrombus. Normal compressibility, respiratory phasicity and response to augmentation. Calf Veins: No evidence of thrombus. Normal compressibility and flow on color Doppler imaging. Other Findings:  None. IMPRESSION: Negative for deep venous thrombosis in left lower extremity. Electronically Signed  By: Markus Daft M.D.   On: 01/26/2022 13:40  ? ?DG Foot 2 Views Left ? ?Result Date: 01/26/2022 ?CLINICAL DATA:  Redness EXAM: LEFT FOOT - 2 VIEW COMPARISON:  None. FINDINGS: No acute fracture or dislocation identified. No bone destruction or periosteal reaction visualized. Dorsal osteophytes in the midfoot. Early plantar and posterior calcaneal spurring. Soft  tissue swelling of the foot. IMPRESSION: No acute osseous abnormality identified.  Soft tissue swelling. Electronically Signed   By: Ofilia Neas M.D.   On: 01/26/2022 14:13   ? ?Assessment and Plan ? ? [name] is a 76 y.o. female with medical history significant of hypertension, asthma, OSA on CPAP, kidney stone, DVT not on anticoagulants, cognitive impairment, chronic venous insufficiency, who presents with pain in left lower leg and foot. ?Pt states that she stepped on a tac on her left foot 4 days ago.  He developed pain and swelling in left foot, which has been spreading up to lower leg, and has been progressively worsening.  Patient was seen by PCP yesterday and prescribed Levaquin after given Rocephin injection. ? ?Cellulitis of lower extremity ?-- patient was started on Levaquin couple days ago. No fever on leukocytosis. Mild increase redness brought her to the emergency room ?-- means afebrile ?-- lower extremity Doppler negative ?-- normal white count ?-- blood culture negative ?-- continue IV Rocephin ? ?Hypokalemia ?-- patient takes Lasix for her bilateral lower extremity lymphedema. She forgets occasionally to take her potassium due to the pills being big in size. She is agreeable to take klor con  at home ?-- pharmacy to replete orally and IV ? ?Hypertension ?-- continue Cozaar ? ?obstructive sleep apnea ?-- on CPAP ? ?generalized weakness ?-- will ask PT to see patient ? ? ? ?Procedures: ?Family communication : none ?Consults : none ?CODE STATUS: full ?DVT Prophylaxis : ?Level of care: Med-Surg ?Status is: inpatient ?will continue to monitor electrolytes. PT to see patient. If remains stable will discharged to oral antibiotic tomorrow. Patient in agreement. ?  ? ?TOTAL TIME TAKING CARE OF THIS PATIENT: 25 minutes.  ?>50% time spent on counselling and coordination of care ? ?Note: This dictation was prepared with Dragon dictation along with smaller phrase technology. Any transcriptional  errors that result from this process are unintentional. ? ?Fritzi Mandes M.D  ? ? ?[facility]  ? ?CC: ?Primary care physician; [name], MD  ?

## 2022-01-27 NOTE — Progress Notes (Signed)
Paged Dr. Posey Pronto and reported to [facility], RN K of 2.4. ?

## 2022-01-27 NOTE — Evaluation (Signed)
Occupational Therapy Evaluation Patient Details Name: [name] MRN: [id number] DOB: [birth year] Today's Date: 01/27/2022   History of Present Illness Pt is a 76 y/o F admitted on 01/26/22 after presenting with c/c of pain in LLE & foot. Pt reports she stepped on a tac 4 days ago & has had worsening pain since. Pt is being treated for LLE cellulitis. PMH: HTN, asthma, OSA on CPAP, kidney stone, DVT not on anticoagulants, cognitive impairment, chronic venous insufficiency, diverticulosis, B hearing loss, hiatal hernia, restless leg syndrome, vitamin D deficiency   Clinical Impression   Pt was seen for OT evaluation this date. Prior to hospital admission, pt was living by herself in an RV with her 24 indoor cats. She takes sponge baths, does not drive since MCI diagnosis, and her nephew assists with cleaning the litter boxes. She does endorse having forgotten to take her medications in the past and just recently got a weekly pill organizer to assist. Currently pt demonstrates impairments as described below (See OT problem list) which functionally limit her ability to perform ADL/self-care tasks. Pt currently requires MIN A for LB ADL tasks, MIN A for ADL transfers with cues for RW mgt/safety, and supervision for seated UB ADL tasks. Pt required CGA for BLE mgt back to bed at end of session, MIN A to stand from EOB with VC for RW mgt and notable initial posterior lean requiring assist from OT to correct. Pt educated in falls prevention, home/routines modifications, and RW mgt/ADL transfers. OT facilitated problem solving in daily routines and care for herself and her cats and assist available/possible to maximize her safety. Educated in benefits of cell phone or life alert pendant to maximize safety since she lives alone, and use of cognitive compensatory strategies for medication mgt to maximize safety, as she endorses having forgottent to take medications in the past. Pt verbalized understanding and eager  to work hard to get well. Pt would benefit from skilled OT services to address noted impairments and functional limitations (see below for any additional details) in order to maximize safety and independence while minimizing falls risk and caregiver burden. Upon hospital discharge, recommend STR to maximize pt safety and return to PLOF.     Recommendations for follow up therapy are one component of a multi-disciplinary discharge planning process, led by the attending physician.  Recommendations may be updated based on patient status, additional functional criteria and insurance authorization.   Follow Up Recommendations  Skilled nursing-short term rehab (<3 hours/day)    Assistance Recommended at Discharge Frequent or constant Supervision/Assistance  Patient can return home with the following A little help with walking and/or transfers;A little help with bathing/dressing/bathroom;Assistance with cooking/housework;Assist for transportation;Direct supervision/assist for financial management;Direct supervision/assist for medications management;Help with stairs or ramp for entrance    Functional Status Assessment  Patient has had a recent decline in their functional status and demonstrates the ability to make significant improvements in function in a reasonable and predictable amount of time.  Equipment Recommendations  BSC/3in1    Recommendations for Other Services       Precautions / Restrictions Precautions Precautions: Fall Restrictions Weight Bearing Restrictions: No      Mobility Bed Mobility Overal bed mobility: Needs Assistance Bed Mobility: Supine to Sit, Sit to Supine     Supine to sit: Supervision, HOB elevated Sit to supine: Min guard   General bed mobility comments: CGA for BLE mgt    Transfers Overall transfer level: Needs assistance Equipment used: Rolling walker (2 wheels)  Transfers: Sit to/from Stand Sit to Stand: Min assist, From elevated surface            General transfer comment: VC for hand placement and RW mgt      Balance Overall balance assessment: Needs assistance Sitting-balance support: Feet supported Sitting balance-Leahy Scale: Good   Postural control: Posterior lean Standing balance support: Bilateral upper extremity supported, During functional activity, Reliant on assistive device for balance Standing balance-Leahy Scale: Fair Standing balance comment: initially poor with transition to standing with posterior lean requiring CGA-MIN A to correct and VC for anterior weight shift                           ADL either performed or assessed with clinical judgement   ADL Overall ADL's : Needs assistance/impaired                                       General ADL Comments: v     Vision         Perception     Praxis      Pertinent Vitals/Pain Pain Assessment Pain Assessment: 0-10 Pain Score: 5  Pain Location: LLE at rest Pain Descriptors / Indicators: Discomfort Pain Intervention(s): Limited activity within patient's tolerance, Monitored during session, Premedicated before session, Repositioned     Hand Dominance     Extremity/Trunk Assessment Upper Extremity Assessment Upper Extremity Assessment: Overall WFL for tasks assessed   Lower Extremity Assessment Lower Extremity Assessment: Generalized weakness;LLE deficits/detail;RLE deficits/detail RLE Deficits / Details: RLE in gauze wrap, being seen at the wound clinic for RLE LLE Deficits / Details: LLE edema & minimal dorsiflexion, knee flexion & extension AROM 2/2 pain & stiffness, pt with edema & redness noted to LLE   Cervical / Trunk Assessment Cervical / Trunk Assessment:  (forward head)   Communication Communication Communication: No difficulties   Cognition Arousal/Alertness: Awake/alert Behavior During Therapy: WFL for tasks assessed/performed Overall Cognitive Status: Within Functional Limits for tasks assessed                                  General Comments: Pt reports recent hx of "cognitive impairement" 2/2 steroid/medication use & would drive & forget where she was & some passerby would assist her. Follows all commands during session, alert and oriented.     General Comments       Exercises Other Exercises Other Exercises: Pt educated in falls prevention, home/routines modifications, and RW mgt/ADL transfers. OT facilitated problem solving in daily routines and care for herself and her cats and assist available/possible to maximize her safety. Educated in benefits of cell phone or life alert pendant to maximize safety since she lives alone, and use of cognitive compensatory strategies for medication mgt to maximize safety, as she endorses having forgottent to take medications in the past.   Shoulder Instructions      Home Living Family/patient expects to be discharged to:: Private residence Living Arrangements: Alone Available Help at Discharge: Family;Available PRN/intermittently Type of Home:  (44 ft parked RV) Home Access: Stairs to enter Entrance Stairs-Number of Steps: 4 Entrance Stairs-Rails: Right;Left;Can reach both Home Layout: One level               Home Equipment: Agricultural consultant (2 wheels)   Additional Comments: Pt reports she  lives in a 44 ft parked RV with 4 steps with B rails to enter. Pt reports she was driving, no falls in the past 6 months. Pt lives with 24 cats inside of the RV.      Prior Functioning/Environment Prior Level of Function : Independent/Modified Independent             Mobility Comments: Pt reports she is independent without AD but has used RW for the past week 2/2 this issue/LLE pain. Denies falls in the past 6 months. Driving. Cares for 24 indoor cats (feeds them but nephew maintains litter boxes). Reports she negotiates 4 steps to get into house laterally with 1 rail. ADLs Comments: Pt reports she takes a sink bath 1x/week, otherwise  she uses disposable wash cloths. Pt reports she hasn't showered since her neck surgery years ago.        OT Problem List: Decreased strength;Pain;Decreased safety awareness;Decreased knowledge of use of DME or AE;Impaired balance (sitting and/or standing)      OT Treatment/Interventions: Self-care/ADL training;Therapeutic activities;Therapeutic exercise;Cognitive remediation/compensation;DME and/or AE instruction;Patient/family education;Balance training    OT Goals(Current goals can be found in the care plan section) Acute Rehab OT Goals Patient Stated Goal: get stronger at rehab before going home to my cats OT Goal Formulation: With patient Time For Goal Achievement: 02/10/22 Potential to Achieve Goals: Good ADL Goals Pt Will Transfer to Toilet: with modified independence;ambulating (LRAD) Pt Will Perform Toileting - Clothing Manipulation and hygiene: with modified independence Additional ADL Goal #1: Pt will utilize AE PRN for LB bathing and dressing involving ADL transfers with remote supervision for safety. Additional ADL Goal #2: Pt will verbalize plan to implement at least 1 learned falls prevention strategy to maximize safety. Additional ADL Goal #3: Pt will perform medication mgt using weekly pill organizer with modified independence.  OT Frequency: Min 2X/week    Co-evaluation              AM-PAC OT "6 Clicks" Daily Activity     Outcome Measure Help from another person eating meals?: None Help from another person taking care of personal grooming?: A Little Help from another person toileting, which includes using toliet, bedpan, or urinal?: A Little Help from another person bathing (including washing, rinsing, drying)?: A Lot Help from another person to put on and taking off regular upper body clothing?: A Little Help from another person to put on and taking off regular lower body clothing?: A Lot 6 Click Score: 17   End of Session Equipment Utilized During Treatment:  Rolling walker (2 wheels)  Activity Tolerance: Patient tolerated treatment well Patient left: in bed;with call bell/phone within reach;with bed alarm set;with nursing/sitter in room  OT Visit Diagnosis: Other abnormalities of gait and mobility (R26.89);Muscle weakness (generalized) (M62.81);Pain Pain - Right/Left: Left Pain - part of body: Leg                Time: 1610-9604 OT Time Calculation (min): 18 min Charges:  OT General Charges $OT Visit: 1 Visit OT Evaluation $OT Eval Moderate Complexity: 1 Mod OT Treatments $Self Care/Home Management : 8-22 mins  Arman Filter., MPH, MS, OTR/L ascom [phone] 01/27/22, 4:39 PM

## 2022-01-27 NOTE — Evaluation (Addendum)
Physical Therapy Evaluation Patient Details Name: [name] MRN: [id number] DOB: November 25, 1945 Today's Date: 01/27/2022  History of Present Illness  Pt is a 76 y/o F admitted on 01/26/22 after presenting with c/c of pain in LLE & foot. Pt reports she stepped on a tac 4 days ago & has had worsening pain since. Pt is being treated for LLE cellulitis. PMH: HTN, asthma, OSA on CPAP, kidney stone, DVT not on anticoagulants, cognitive impairment, chronic venous insufficiency, diverticulosis, B hearing loss, hiatal hernia, restless leg syndrome, vitamin D deficiency  Clinical Impression  Pt seen for PT evaluation with pt reporting she lives in a parked RV with her 24 indoor cats. Pt reports she is typically independent without AD but has started using a RW over the past week 2/2 LLE pain. On this date, pt transfers STS with RW & supervision with cuing for hand placement. Pt is able to ambulate short distance in room with RW & CGA<>supervision but with impaired gait pattern as noted below. Pt's impaired gait pattern increases her fall risk, especially living at home with so many pets that pt reports could trip her. Pt reports she does not have anyone that can assist her at home but will contact sister & see if that's a possibility. At this time pt is unsafe to d/c home alone & would benefit from STR upon d/c to maximize independence with functional mobility & reduce fall risk prior to return home. (If pt's sister is able to assist her upon d/c pt could d/c home with HHPT services). Will continue to follow pt acutely to progress gait & for stair negotiation, as pt has 4 steps with B rails to enter her RV home.     Addendum: Pt noted to have critically low K+ (2.4) but PT spoke with MD via secure chat, who reports pt has received enough K+ supplementation to participate in PT at this time. HR after gait 82 bpm.   Recommendations for follow up therapy are one component of a multi-disciplinary discharge planning  process, led by the attending physician.  Recommendations may be updated based on patient status, additional functional criteria and insurance authorization.  Follow Up Recommendations Skilled nursing-short term rehab (<3 hours/day) (unless pt can find family assistance at home, then pt can d/c home with HHPT)    Assistance Recommended at Discharge Intermittent Supervision/Assistance  Patient can return home with the following  A little help with walking and/or transfers;A little help with bathing/dressing/bathroom;Assistance with cooking/housework;Direct supervision/assist for medications management;Help with stairs or ramp for entrance    Equipment Recommendations Rolling walker (2 wheels)  Recommendations for Other Services    OT consult   Functional Status Assessment Patient has had a recent decline in their functional status and demonstrates the ability to make significant improvements in function in a reasonable and predictable amount of time.     Precautions / Restrictions Precautions Precautions: Fall Restrictions Weight Bearing Restrictions: No      Mobility  Bed Mobility               General bed mobility comments: not observed, pt received & left sitting in recliner    Transfers Overall transfer level: Needs assistance Equipment used: Rolling walker (2 wheels) Transfers: Sit to/from Stand Sit to Stand: Supervision           General transfer comment: cuing for safe hand placement    Ambulation/Gait Ambulation/Gait assistance: Supervision, Min guard Gait Distance (Feet): 20 Feet Assistive device: Rolling walker (2 wheels)  Gait Pattern/deviations: Decreased step length - right, Decreased step length - left, Decreased stride length, Decreased dorsiflexion - right, Decreased dorsiflexion - left, Decreased weight shift to left Gait velocity: decreased     General Gait Details: decreased foot clearance BLE (LLE worse than RLE, pt dragging LLE across floor  most of the time 2/2 decreased hip/knee flexion during swing phase), decreased heel strike BLE. Education/cuing for side stepping in small spaces laterally with RW.  Stairs            Wheelchair Mobility    Modified Rankin (Stroke Patients Only)       Balance Overall balance assessment: Needs assistance Sitting-balance support: Feet supported Sitting balance-Leahy Scale: Good     Standing balance support: Bilateral upper extremity supported, During functional activity, Reliant on assistive device for balance Standing balance-Leahy Scale: Fair Standing balance comment: reliant on BUE support on RW 2/2 LLE pain with standing/gait                             Pertinent Vitals/Pain Pain Assessment Pain Assessment: 0-10 Pain Score: 5  Pain Location: LLE at rest Pain Descriptors / Indicators: Discomfort Pain Intervention(s): Monitored during session    Home Living Family/patient expects to be discharged to:: Private residence Living Arrangements: Alone Available Help at Discharge: Family;Available PRN/intermittently Type of Home:  (44 ft parked RV) Home Access: Stairs to enter Entrance Stairs-Rails: Right;Left;Can reach both Entrance Stairs-Number of Steps: 4   Home Layout: One level Home Equipment: Agricultural consultant (2 wheels) Additional Comments: Pt reports she lives in a 44 ft parked RV with 4 steps with B rails to enter. Pt reports she was driving, no falls in the past 6 months. Pt lives with 24 cats inside of the RV.    Prior Function Prior Level of Function : Independent/Modified Independent             Mobility Comments: Pt reports she is independent without AD but has used RW for the past week 2/2 this issue/LLE pain. Denies falls in the past 6 months. Driving. Cares for 24 indoor cats (feeds them but nephew maintains litter boxes). Reports she negotiates 4 steps to get into house laterally with 1 rail. ADLs Comments: Pt reports she takes a sink bath  1x/week, otherwise she uses disposable wash cloths. Pt reports she hasn't showered since her neck surgery years ago.     Hand Dominance        Extremity/Trunk Assessment   Upper Extremity Assessment Upper Extremity Assessment: Overall WFL for tasks assessed    Lower Extremity Assessment Lower Extremity Assessment: Generalized weakness;LLE deficits/detail;RLE deficits/detail RLE Deficits / Details: RLE in gauze wrap, being seen at the wound clinic for RLE LLE Deficits / Details: LLE edema & minimal dorsiflexion, knee flexion & extension AROM 2/2 pain & stiffness, pt with edema & redness noted to LLE    Cervical / Trunk Assessment Cervical / Trunk Assessment:  (forward head)  Communication   Communication: No difficulties  Cognition Arousal/Alertness: Awake/alert Behavior During Therapy: WFL for tasks assessed/performed Overall Cognitive Status: Within Functional Limits for tasks assessed                                 General Comments: Pt reports recent hx of "cognitive impairement" 2/2 steroid/medication use & would drive & forget where she was & some passerby would assist her.  General Comments      Exercises General Exercises - Lower Extremity Ankle Circles/Pumps: AROM, Left, 10 reps Long Arc Quad: AROM, Left, 10 reps, Seated Heel Slides: AROM, Left, 10 reps (long sitting in recliner)   Assessment/Plan    PT Assessment Patient needs continued PT services  PT Problem List Decreased strength;Decreased mobility;Decreased safety awareness;Decreased range of motion;Decreased activity tolerance;Pain;Decreased balance;Decreased knowledge of use of DME;Decreased skin integrity       PT Treatment Interventions DME instruction;Therapeutic exercise;Gait training;Balance training;Stair training;Neuromuscular re-education;Functional mobility training;Therapeutic activities;Patient/family education    PT Goals (Current goals can be found in the Care Plan  section)  Acute Rehab PT Goals Patient Stated Goal: go home to her cats PT Goal Formulation: With patient Time For Goal Achievement: 02/10/22 Potential to Achieve Goals: Good    Frequency Min 2X/week     Co-evaluation               AM-PAC PT "6 Clicks" Mobility  Outcome Measure Help needed turning from your back to your side while in a flat bed without using bedrails?: A Little Help needed moving from lying on your back to sitting on the side of a flat bed without using bedrails?: A Little Help needed moving to and from a bed to a chair (including a wheelchair)?: A Little Help needed standing up from a chair using your arms (e.g., wheelchair or bedside chair)?: A Little Help needed to walk in hospital room?: A Little Help needed climbing 3-5 steps with a railing? : A Little 6 Click Score: 18    End of Session   Activity Tolerance: Patient tolerated treatment well;Patient limited by fatigue;Patient limited by pain Patient left: in chair;with call bell/phone within reach Nurse Communication: Mobility status PT Visit Diagnosis: Muscle weakness (generalized) (M62.81);Difficulty in walking, not elsewhere classified (R26.2);Pain Pain - Right/Left: Left Pain - part of body: Leg    Time: 4315-4008 PT Time Calculation (min) (ACUTE ONLY): 27 min   Charges:   PT Evaluation $PT Eval Low Complexity: 1 Low PT Treatments $Therapeutic Activity: 8-22 mins        Aleda Grana, PT, DPT 01/27/22, 2:49 PM   Sandi Mariscal 01/27/2022, 2:44 PM

## 2022-01-28 DIAGNOSIS — L03116 Cellulitis of left lower limb: Secondary | ICD-10-CM | POA: Diagnosis not present

## 2022-01-28 LAB — BASIC METABOLIC PANEL
Anion gap: 6 (ref 5–15)
BUN: 13 mg/dL (ref 8–23)
CO2: 30 mmol/L (ref 22–32)
Calcium: 8.6 mg/dL — ABNORMAL LOW (ref 8.9–10.3)
Chloride: 104 mmol/L (ref 98–111)
Creatinine, Ser: 0.63 mg/dL (ref 0.44–1.00)
GFR, Estimated: >60 mL/min (ref >60)
Glucose, Bld: 97 mg/dL (ref 70–99)
Potassium: 3.5 mmol/L (ref 3.5–5.1)
Sodium: 140 mmol/L (ref 135–145)

## 2022-01-28 LAB — MAGNESIUM: Magnesium: 1.9 mg/dL (ref 1.7–2.4)

## 2022-01-28 LAB — PHOSPHORUS: Phosphorus: 3.3 mg/dL (ref 2.5–4.6)

## 2022-01-28 MED ORDER — POTASSIUM CHLORIDE CRYS ER 20 MEQ PO TBCR
40.0000 meq | EXTENDED_RELEASE_TABLET | Freq: Once | ORAL | Status: AC
  Administered 2022-01-28: 40 meq via ORAL
  Filled 2022-01-28: qty 2

## 2022-01-28 MED ORDER — CEPHALEXIN 500 MG PO CAPS
500.0000 mg | ORAL_CAPSULE | Freq: Four times a day (QID) | ORAL | Status: DC
  Administered 2022-01-28 – 2022-01-29 (×4): 500 mg via ORAL
  Filled 2022-01-28 (×4): qty 1

## 2022-01-28 MED ORDER — ALBUTEROL SULFATE (2.5 MG/3ML) 0.083% IN NEBU: 2.5000 mg | INHALATION_SOLUTION | Freq: Four times a day (QID) | RESPIRATORY_TRACT | Status: DC | PRN

## 2022-01-28 MED ORDER — POTASSIUM CHLORIDE 20 MEQ PO PACK
40.0000 meq | PACK | Freq: Once | ORAL | Status: DC
  Filled 2022-01-28: qty 2

## 2022-01-28 MED ORDER — ALBUTEROL SULFATE HFA 108 (90 BASE) MCG/ACT IN AERS
1.0000 | INHALATION_SPRAY | Freq: Four times a day (QID) | RESPIRATORY_TRACT | Status: DC | PRN
  Administered 2022-01-28 – 2022-01-29 (×2): 1 via RESPIRATORY_TRACT
  Filled 2022-01-28: qty 6.7

## 2022-01-28 NOTE — Progress Notes (Signed)
[facility] ? ? ?PATIENT NAME: [name]   ? ?MR#:  [id number] ? ?DATE OF BIRTH:  1946-08-27 ? ?SUBJECTIVE:  ?patient sitting out in the recliner chair. Feels better. Right lower extremity redness improved. No fever.  ?No family at bedside ?Feels weak ?VITALS:  ?Blood pressure (!) 156/69, pulse 72, temperature 98.2 ?F (36.8 ?C), temperature source Oral, resp. rate 16, height '5\' 1"'$  (1.549 m), weight 90.3 kg, SpO2 97 %. ? ?PHYSICAL EXAMINATION:  ? ?GENERAL:  76 y.o.-year-old patient lying in the bed with no acute distress.  ?LUNGS: Normal breath sounds bilaterally, no wheezing, rales, rhonchi.  ?CARDIOVASCULAR: S1, S2 normal. No murmurs, rubs, or gallops.  ?ABDOMEN: Soft, nontender, nondistended. Bowel sounds present.  ?EXTREMITIES:  ?   ?NEUROLOGIC: nonfocal  patient is alert and awake ?SKIN:as above ?LABORATORY PANEL:  ?CBC ?Recent Labs  ?Lab 01/27/22 ?6073  ?WBC 7.5  ?HGB 11.4*  ?HCT 35.8*  ?PLT 193  ? ? ? ?Chemistries  ?Recent Labs  ?Lab 01/26/22 ?1238 01/26/22 ?1344 01/28/22 ?0412  ?NA 140   < > 140  ?K 2.5*   < > 3.5  ?CL 97*   < > 104  ?CO2 33*   < > 30  ?GLUCOSE 102*   < > 97  ?BUN 12   < > 13  ?CREATININE 0.67   < > 0.63  ?CALCIUM 9.1   < > 8.6*  ?MG  --    < > 1.9  ?AST 17  --   --   ?ALT 24  --   --   ?ALKPHOS 48  --   --   ?BILITOT 1.2  --   --   ? < > = values in this interval not displayed.  ? ? ?Cardiac Enzymes ?No results for input(s): TROPONINI in the last 168 hours. ?RADIOLOGY:  ?DG Tibia/Fibula Left ? ?Result Date: 01/26/2022 ?CLINICAL DATA:  Redness EXAM: LEFT TIBIA AND FIBULA - 2 VIEW COMPARISON:  None. FINDINGS: No acute fracture or dislocation identified. No bone destruction or periosteal reaction. Vascular calcifications noted. IMPRESSION: No acute osseous abnormality identified. Electronically Signed   By: Ofilia Neas M.D.   On: 01/26/2022 14:14  ? ?US Venous Img Lower Unilateral Left (DVT) ? ?Result Date: 01/26/2022 ?CLINICAL DATA:  Swelling and  redness of lower legs. EXAM: LEFT LOWER EXTREMITY VENOUS DOPPLER ULTRASOUND TECHNIQUE: Gray-scale sonography with graded compression, as well as color Doppler and duplex ultrasound were performed to evaluate the lower extremity deep venous systems from the level of the common femoral vein and including the common femoral, femoral, profunda femoral, popliteal and calf veins including the posterior tibial, peroneal and gastrocnemius veins when visible. The superficial great saphenous vein was also interrogated. Spectral Doppler was utilized to evaluate flow at rest and with distal augmentation maneuvers in the common femoral, femoral and popliteal veins. COMPARISON:  08/15/2017 FINDINGS: Contralateral Common Femoral Vein: Respiratory phasicity is normal and symmetric with the symptomatic side. No evidence of thrombus. Normal compressibility. Common Femoral Vein: No evidence of thrombus. Normal compressibility, respiratory phasicity and response to augmentation. Saphenofemoral Junction: No evidence of thrombus. Normal compressibility and flow on color Doppler imaging. Profunda Femoral Vein: No evidence of thrombus. Normal compressibility and flow on color Doppler imaging. Femoral Vein: No evidence of thrombus. Normal compressibility, respiratory phasicity and response to augmentation. Popliteal Vein: No evidence of thrombus. Normal compressibility, respiratory phasicity and response to augmentation. Calf Veins: No evidence of thrombus. Normal compressibility and flow on  color Doppler imaging. Other Findings:  None. IMPRESSION: Negative for deep venous thrombosis in left lower extremity. Electronically Signed   By: Markus Daft M.D.   On: 01/26/2022 13:40  ? ?DG Foot 2 Views Left ? ?Result Date: 01/26/2022 ?CLINICAL DATA:  Redness EXAM: LEFT FOOT - 2 VIEW COMPARISON:  None. FINDINGS: No acute fracture or dislocation identified. No bone destruction or periosteal reaction visualized. Dorsal osteophytes in the midfoot. Early  plantar and posterior calcaneal spurring. Soft tissue swelling of the foot. IMPRESSION: No acute osseous abnormality identified.  Soft tissue swelling. Electronically Signed   By: Ofilia Neas M.D.   On: 01/26/2022 14:13   ? ?Assessment and Plan ? ? [name] is a 76 y.o. female with medical history significant of hypertension, asthma, OSA on CPAP, kidney stone, DVT not on anticoagulants, cognitive impairment, chronic venous insufficiency, who presents with pain in left lower leg and foot. ?Pt states that she stepped on a tac on her left foot 4 days ago.  He developed pain and swelling in left foot, which has been spreading up to lower leg, and has been progressively worsening.  Patient was seen by PCP yesterday and prescribed Levaquin after given Rocephin injection. ? ?Cellulitis of lower extremity ?-- patient was started on Levaquin couple days ago. No fever on leukocytosis. Mild increase redness brought her to the emergency room ?-- means afebrile ?-- lower extremity Doppler negative ?-- normal white count ?-- blood culture negative ?-- continue IV Rocephin--change to po keflex--7 days ? ?Hypokalemia ?-- patient takes Lasix for her bilateral lower extremity lymphedema. She forgets occasionally to take her potassium due to the pills being big in size. She is agreeable to take klor con  at home ?-- pharmacy to replete orally and IV ?--K 3.8 ? ?Hypertension ?-- continue Cozaar ? ?obstructive sleep apnea ?-- on CPAP ? ?generalized weakness ?-- PT eval appreciated--rehab ?--TOC for d/c planning ? ?Right LE chronic Unna boot ?-- patient follows in the wound clinic. ?-Consult wound nurse for follow-up with change and evaluation for underlying wound ? ?Procedures: ?Family communication : none ?Consults : none ?CODE STATUS: full ?DVT Prophylaxis : ?Level of care: Med-Surg ?Status is: inpatient ?TOC for discharge planning ?patient is in agreement with rehab ? ? ?TOTAL TIME TAKING CARE OF THIS PATIENT: 25 minutes.   ?>50% time spent on counselling and coordination of care ? ?Note: This dictation was prepared with Dragon dictation along with smaller phrase technology. Any transcriptional errors that result from this process are unintentional. ? ?Fritzi Mandes M.D  ? ? ?[facility]  ? ?CC: ?Primary care physician; [name], MD  ?

## 2022-01-28 NOTE — TOC Progression Note (Signed)
Transition of Care (TOC) - Progression Note  ? ? ?Patient Details  ?Name: [name] ?MRN: [id number] ?Date of Birth: April 10, 1946 ? ?Transition of Care (TOC) CM/SW Contact  ?Pete Pelt, RN ?Phone Number: ?01/28/2022, 10:51 AM ? ?Clinical Narrative:   Patient is in agreement with SNF, however she lives in [place] and would like rehab local to her. Bed search sent to [facility] in [facility].  Awaiting response.  TOC to follow. ? ? ? ?  ?  ? ?Expected Discharge Plan and Services ?  ?  ?  ?  ?  ?                ?  ?  ?  ?  ?  ?  ?  ?  ?  ?  ? ? ?Social Determinants of Health (SDOH) Interventions ?  ? ?Readmission Risk Interventions ?   ? View : No data to display.  ?  ?  ?  ? ? ?

## 2022-01-28 NOTE — Consult Note (Signed)
PHARMACY CONSULT NOTE - FOLLOW UP ? ?Pharmacy Consult for Electrolyte Monitoring and Replacement  ? ?Recent Labs: ?Potassium  ?Date Value  ?01/28/2022 3.5 mmol/L  ?08/15/2017 4.1 mEq/L  ?09/17/2016 3.5 mEq/L  ? ?Magnesium (mg/dL)  ?Date Value  ?01/28/2022 1.9  ? ?Calcium (mg/dL)  ?Date Value  ?01/28/2022 8.6 (L)  ?08/15/2017 9.7  ?09/17/2016 9.8  ? ?Albumin (g/dL)  ?Date Value  ?01/26/2022 3.5  ?08/15/2017 4.0  ?09/17/2016 3.6  ? ?Phosphorus (mg/dL)  ?Date Value  ?01/28/2022 3.3  ? ?Sodium  ?Date Value  ?01/28/2022 140 mmol/L  ?08/15/2017 149 mEq/L (H)  ?09/17/2016 143 mEq/L  ? ? ?Assessment: ?76 y.o. female with medical history significant of hypertension, asthma, OSA on CPAP, kidney stone, DVT not on anticoagulants, cognitive impairment, chronic venous insufficiency, who presents with pain in left lower leg and foot following stepping on  a thumb tac 4 days prior. Noted to have hypokalemia. Pharmacy has been consulted to monitor and replace electrolytes  ? ?Goal of Therapy:  ?Electrolytes WNL ? ?Plan:  ?Potassium WNL but on low end of normal ?KCL 74mq PO x 1 dose ordered ?Will recheck electrolytes with AM labs ? ?WPearla Dubonnet PharmD ?Clinical Pharmacist ?01/28/2022 ?7:48 AM ? ? ?

## 2022-01-28 NOTE — NC FL2 (Signed)
?[place] MEDICAID FL2 LEVEL OF CARE SCREENING TOOL  ?  ? ?IDENTIFICATION  ?Patient Name: ?[name] Birthdate: 1946-01-26 Sex: female Admission Date (Current Location): ?01/26/2022  ?[place] and [place] Number: ? [place] ?  Facility and Address:  ?[facility], [address], [place], [place] [address] ?     Provider Number: ?[id number]  ?Attending Physician Name and Address:  ?Fritzi Mandes, MD ? Relative Name and Phone Number:  ?Arvil Persons (Sister)   [phone] [facility] Phone) ?   ?Current Level of Care: ?Hospital Recommended Level of Care: ?[place] Prior Approval Number: ?  ? ?Date Approved/Denied: ?  PASRR Number: ?[id number] ? ?Discharge Plan: ?SNF ?  ? ?Current Diagnoses: ?Patient Active Problem List  ? Diagnosis Date Noted  ? Cellulitis 01/27/2022  ? Cellulitis of left lower extremity 01/26/2022  ? Hypokalemia 01/26/2022  ? Cellulitis of foot 01/25/2022  ? Mild cognitive impairment 01/25/2022  ? Venous stasis ulcer of right lower leg with edema of right lower leg ([place]) 11/16/2021  ? Low iron 10/01/2021  ? 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 Schmorl's nodes of lumbar region 08/25/2021  ? Weight loss 07/05/2021  ? Chronic low back pain with right-sided sciatica 12/12/2020  ? Somnolence 04/12/2020  ? Positive ANA (antinuclear antibody) 12/01/2019  ? 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 Peripheral neuropathy 10/14/2019  ? Pain due to onychomycosis of toenails of both feet 05/04/2019  ? Kidney cysts 10/08/2018  ? Liver cyst 10/08/2018  ? Calcium pyrophosphate arthropathy of multiple sites 08/20/2018  ? CTS (carpal tunnel syndrome) 07/13/2018  ? Skin rash 04/04/2018  ? Venous stasis ulcer of left lower leg with edema of left lower leg (H[facility]) 02/05/2018  ? Elbow stiffness, right 03/08/2017  ? Leg pain, bilateral 08/28/2016  ? Bilateral lower extremity edema 08/13/2016  ? 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 Kyphosis of cervical region  07/02/2016  ? Intertrigo 03/22/2016  ? Cervical myelopathy with cervical radiculopathy ([place]) 08/18/2015  ? Varicose veins of both lower extremities with complications 95/07/3266  ? Advanced care planning/counseling discussion 09/22/2014  ? Medicare annual wellness visit, subsequent 05/15/2013  ? Allergic rhinitis 04/03/2013  ? Vitamin D deficiency 03/18/2013  ? Hypertension   ? Asthma in adult   ? Chronic venous insufficiency   ? Arthritis   ? OSA on CPAP   ? Melanoma ([place])   ? ? ?Orientation RESPIRATION BLADDER Height & Weight   ?  ?Self, Time, Situation, Place ? Normal Continent Weight: 90.3 kg ?Height:  '5\' 1"'$  (154.9 cm)  ?BEHAVIORAL SYMPTOMS/MOOD NEUROLOGICAL BOWEL NUTRITION STATUS  ?    Continent Diet (Heart)  ?AMBULATORY STATUS COMMUNICATION OF NEEDS Skin   ?Limited Assist Verbally Other (Comment) (Non pressure pre tibial Left, red but no dressing) ?  ?  ?  ?    ?     ?     ? ? ?Personal Care Assistance Level of Assistance  ?Bathing, Feeding, Dressing Bathing Assistance: Limited assistance ?Feeding assistance: Limited assistance ?Dressing Assistance: Limited assistance ?   ? ?Functional Limitations Info  ?Sight, Hearing, Speech Sight Info: Adequate ?Hearing Info: Adequate ?Speech Info: Adequate (missing teeth)  ? ? ?SPECIAL CARE FACTORS FREQUENCY  ?PT (By licensed PT), OT (By licensed OT)   ?  ?PT Frequency: Min 5x weekly ?OT Frequency: Min 5x weekly ?  ?  ?  ?   ? ? ?Contractures Contractures Info: Not present  ? ? ?Additional Factors Info  ?Code Status, Allergies Code Status  Info: FULL CODE ?Allergies Info: Neoprene, Sulfa antibiotics, Voltaren, Latex, Tape, Doxycycline ?  ?  ?  ?   ? ?Current Medications (01/28/2022):  This is the current hospital active medication list ?Current Facility-Administered Medications  ?Medication Dose Route Frequency Provider Last Rate Last Admin  ? acetaminophen (TYLENOL) tablet 650 mg  650 mg Oral Q6H PRN Ivor Costa, MD      ? acidophilus (RISAQUAD) capsule 1 capsule  1 capsule  Oral Daily Ivor Costa, MD   1 capsule at 01/28/22 0915  ? albuterol (PROVENTIL) (2.5 MG/3ML) 0.083% nebulizer solution 2.5 mg  2.5 mg Inhalation Q6H PRN Fritzi Mandes, MD      ? aspirin EC tablet 81 mg  81 mg Oral QHS Ivor Costa, MD   81 mg at 01/27/22 2016  ? cephALEXin (KEFLEX) capsule 500 mg  500 mg Oral Q6H Fritzi Mandes, MD      ? enoxaparin (LOVENOX) injection 45 mg  0.5 mg/kg Subcutaneous Q24H Ivor Costa, MD   45 mg at 01/27/22 1829  ? hydrALAZINE (APRESOLINE) injection 5 mg  5 mg Intravenous Q2H PRN Ivor Costa, MD      ? influenza vaccine adjuvanted (FLUAD) injection 0.5 mL  0.5 mL Intramuscular Tomorrow-1000 Ivor Costa, MD      ? loratadine (CLARITIN) tablet 10 mg  10 mg Oral Daily Ivor Costa, MD   10 mg at 01/28/22 0915  ? losartan (COZAAR) tablet 50 mg  50 mg Oral Daily Ivor Costa, MD   50 mg at 01/28/22 5400  ? montelukast (SINGULAIR) tablet 10 mg  10 mg Oral Daily Ivor Costa, MD   10 mg at 01/28/22 8676  ? multivitamin with minerals tablet 1 tablet  1 tablet Oral Daily Ivor Costa, MD   1 tablet at 01/28/22 1950  ? ondansetron (ZOFRAN) injection 4 mg  4 mg Intravenous Q8H PRN Ivor Costa, MD      ? oxyCODONE-acetaminophen (PERCOCET/ROXICET) 5-325 MG per tablet 1 tablet  1 tablet Oral Q4H PRN Ivor Costa, MD      ? ? ? ?Discharge Medications: ?Please see discharge summary for a list of discharge medications. ? ?Relevant Imaging Results: ? ?Relevant Lab Results: ? ? ?Additional Information ?SSN [id number] ? ?Pete Pelt, RN ? ? ? ? ?

## 2022-01-29 ENCOUNTER — Ambulatory Visit: Payer: Medicare Other | Admitting: Physician Assistant

## 2022-01-29 DIAGNOSIS — L03116 Cellulitis of left lower limb: Secondary | ICD-10-CM | POA: Diagnosis not present

## 2022-01-29 DIAGNOSIS — M6281 Muscle weakness (generalized): Secondary | ICD-10-CM | POA: Insufficient documentation

## 2022-01-29 MED ORDER — CEPHALEXIN 500 MG PO CAPS: 500.0000 mg | ORAL_CAPSULE | Freq: Four times a day (QID) | ORAL | 0 refills | Status: AC

## 2022-01-29 MED ORDER — JUVEN PO PACK
1.0000 | PACK | Freq: Two times a day (BID) | ORAL | Status: DC
  Administered 2022-01-29: 1 via ORAL

## 2022-01-29 MED ORDER — JUVEN PO PACK: 1.0000 | PACK | Freq: Two times a day (BID) | ORAL | 0 refills | Status: DC

## 2022-01-29 NOTE — Progress Notes (Addendum)
Initial Nutrition Assessment ? ?DOCUMENTATION CODES:  ? ?Obesity unspecified ? ?INTERVENTION:  ?-1 packet Juven BID, each packet provides 95 calories, 2.5 grams of protein (collagen), and 9.8 grams of carbohydrate (3 grams sugar); to support wound healing  ? ?-Heart Healthy diet ? ?NUTRITION DIAGNOSIS:  ? ?Increased nutrient needs related to wound healing as evidenced by estimated needs. ? ?GOAL:  ?Patient will meet greater than or equal to 90% of their needs ? ?MONITOR:  ?PO intake, Supplement acceptance, Labs, Skin, Weight trends ? ?REASON FOR ASSESSMENT:  ? ?Malnutrition Screening Tool ?  ? ?ASSESSMENT:  ?RD working remotely. ? ?Patient is a 76 yo female with hx of venous stasis  right medial ankle wound, Chronic venous insufficiency, hypertension, asthma and OSA.  ? ?Patient complaining of pain, swelling in left lower leg and foot. BLE edema mild to moderate.  ? ?[place] nurse consulted. ? ?Multiple attempts to reach patient by telephone. Chart reviewed. Meal completion 50-100% (5 meals) and patient is feeding herself. Talked with her nurse.  ? ?Disposition [place] per CM/SW   ? ?Medications: MVI, Risaquad. ? ?Weight: range 87-90 kg > 3 months. Currently 90.3 kg. In October patient weighed 92.7 kg down overall 2.6% compared to 5 months ago. BLE edema noted. ? ?Labs: ? ?  Latest Ref Rng & Units 01/28/2022  ?  4:12 AM 01/27/2022  ?  8:09 PM 01/27/2022  ?  5:56 AM  ?BMP  ?Glucose 70 - 99 mg/dL 97    136    ?BUN 8 - 23 mg/dL 13    12    ?Creatinine 0.44 - 1.00 mg/dL 0.63    0.64    ?Sodium 135 - 145 mmol/L 140    139    ?Potassium 3.5 - 5.1 mmol/L 3.5   3.8   2.4    ?Chloride 98 - 111 mmol/L 104    99    ?CO2 22 - 32 mmol/L 30    32    ?Calcium 8.9 - 10.3 mg/dL 8.6    8.5    ?   ? ?NUTRITION - FOCUSED PHYSICAL EXAM: ?Unable to complete Nutrition-Focused physical exam at this time.   ? ?Diet Order:   ?Diet Order   ? ?       ?  Diet Heart Room service appropriate? Yes; Fluid consistency: Thin  Diet effective now        ?  ? ?  ?  ? ?  ? ? ?EDUCATION NEEDS:  ?No education needs have been identified at this time ? ?Skin:  Skin Assessment: Skin Integrity Issues: ?Skin Integrity Issues:: Other (Comment) ?Other: Right LE chronic Unna boot  -- patient followed at wound clinic. ? ?Last BM:  3/27 per patient ? ?Height:  ? ?Ht Readings from Last 1 Encounters:  ?01/26/22 '5\' 1"'$  (1.549 m)  ? ? ?Weight:  ? ?Wt Readings from Last 1 Encounters:  ?01/26/22 90.3 kg  ? ? ?Ideal Body Weight:   48 kg ? ?BMI:  Body mass index is 37.6 kg/m?. ? ?Estimated Nutritional Needs:  ? ?Kcal:  1500-1700 ? ?Protein:  82-86 gr ? ?Fluid:  1600 ml daily ? ? ?Colman Cater MS,RD,CSG,LDN ?Contact: [facility]  ? ?

## 2022-01-29 NOTE — Discharge Summary (Signed)
?Physician Discharge Summary ?  ?Patient: [name] MRN: [id number] DOB: [birth year]  ?Admit date:     01/26/2022  ?Discharge date: 01/29/22  ?Discharge Physician: Fritzi Mandes  ? ?PCP: [name], MD  ? ?Recommendations at discharge:  ? ?follow-up your PCP in 1 to 2 weeks ?patient to follow-up wound clinic every week for right LE unna boot change ? ?Discharge Diagnoses: ?cellulitis left lower extremity ? ?Hospital Course: ? ? [name] is a 76 y.o. female with medical history significant of hypertension, asthma, OSA on CPAP, kidney stone, DVT not on anticoagulants, cognitive impairment, chronic venous insufficiency, who presents with pain in left lower leg and foot. ?Pt states that she stepped on a tac on her left foot 4 days ago.  He developed pain and swelling in left foot, which has been spreading up to lower leg, and has been progressively worsening.  Patient was seen by PCP yesterday and prescribed Levaquin after given Rocephin injection. ?  ?Cellulitis of left lower extremity ?-- patient was started on Levaquin couple days ago. No fever on leukocytosis. Mild increase redness brought her to the emergency room ?-- means afebrile ?-- lower extremity Doppler negative ?-- normal white count ?-- blood culture negative ?-- continue IV Rocephin--change to po keflex--7 days ?  ?Hypokalemia ?-- patient takes Lasix for her bilateral lower extremity lymphedema. She forgets occasionally to take her potassium due to the pills being big in size. She is agreeable to take klor con  at home ?--K 3.8 ?  ?Hypertension ?-- continue Cozaar ?  ?obstructive sleep apnea ?-- on CPAP ?  ?generalized weakness ?-- PT eval appreciated--rehab ?--TOC for d/c planning ?  ?Right LE chronic Unna boot ?-- patient follows in the wound clinic. ?-Consult wound nurse --Louretta Parma boot right LE changed today. ?Patient needs change every week on Monday ? ?hemodynamically stable will discharged to rehab patient agreeable. ? ?  ? ? ?Consultants:  none ?Procedures performed: none  ?Disposition: Rehabilitation facility ?Diet recommendation:  ?Discharge Diet Orders (From admission, onward)  ? ?  Start     Ordered  ? 01/29/22 0000  Diet - low sodium heart healthy       ? 01/29/22 1206  ? ?  ?  ? ?  ? ?Cardiac diet ?DISCHARGE MEDICATION: ?Allergies as of 01/29/2022   ? ?   Reactions  ? Sulfa Antibiotics Itching, Swelling, Shortness Of Breath  ? Facial swelling  ? Voltaren [diclofenac Sodium] Shortness Of Breath  ? Latex Other (See Comments)  ? Blisters ?ONLY HAD REACTION TO TAPE  (??? ONLY ADHESIVE ALLERGY)  ? Tape Other (See Comments)  ? Caused blisters - must use paper tape - same reaction from NeoPrene  ? Doxycycline Other (See Comments)  ? Diaphoresis and dizziness  ? Other   ? Neoprene  ? ?  ? ?  ?Medication List  ?  ? ?STOP taking these medications   ? ?levofloxacin 500 MG tablet ?Commonly known as: LEVAQUIN ?  ?UNABLE TO FIND ?  ? ?  ? ?TAKE these medications   ? ?acetaminophen 650 MG CR tablet ?Commonly known as: TYLENOL ?Take 2 tablets (1,300 mg total) by mouth 2 (two) times daily as needed for pain. ?  ?albuterol 108 (90 Base) MCG/ACT inhaler ?Commonly known as: VENTOLIN HFA ?INHALE 2 PUFFS BY MOUTH EVERY 6 HOURS AS NEEDED FOR WHEEZING/SHORTNESS OF BREATH ?  ?aspirin EC 81 MG tablet ?Take 81 mg by mouth at bedtime. Melanoma prevention ?  ?cephALEXin 500 MG capsule ?  Commonly known as: KEFLEX ?Take 1 capsule (500 mg total) by mouth every 6 (six) hours for 3 days. ?  ?furosemide 40 MG tablet ?Commonly known as: LASIX ?TAKE 1 TABLET 2 (TWO) TIMES DAILY AS NEEDED FOR FLUID. TAKE SECOND DOSE IF NEEDED FOR LEG SWELLING ?  ?Klor-Con M20 20 MEQ tablet ?Generic drug: potassium chloride SA ?TAKE 1 TABLET BY MOUTH TWICE A DAY ?  ?loratadine 10 MG tablet ?Commonly known as: CLARITIN ?Take 10 mg by mouth daily. ?  ?losartan 50 MG tablet ?Commonly known as: COZAAR ?Take 1 tablet (50 mg total) by mouth daily. ?  ?montelukast 10 MG tablet ?Commonly known as:  SINGULAIR ?TAKE 1 TABLET BY MOUTH EVERY DAY ?  ?multivitamin with minerals Tabs tablet ?Take 1 tablet by mouth daily. ?  ?nutrition supplement (JUVEN) Pack ?Take 1 packet by mouth 2 (two) times daily between meals. ?  ?PRESCRIPTION MEDICATION ?CPAP ?  ?PROBIOTIC DAILY PO ?Take 1 tablet by mouth daily. ?  ?TEARS PLUS OP ?Place 1 drop into both eyes daily as needed (dry eyes/ redness/ burning). ?  ?triamcinolone 55 MCG/ACT Aero nasal inhaler ?Commonly known as: NASACORT ?Place 2 sprays into the nose daily. In each nostril ?  ?Vitamin D (Ergocalciferol) 1.25 MG (50000 UNIT) Caps capsule ?Commonly known as: DRISDOL ?TAKE 1 CAPSULE BY MOUTH EVERY 7 DAYS ?  ? ?  ? ?  ?  ? ? ?  ?Discharge Care Instructions  ?(From admission, onward)  ?  ? ? ?  ? ?  Start     Ordered  ? 01/29/22 0000  Discharge wound care:       ?Comments: Unna boot change every Monday--last changed 01/29/22 ? ? cleaned the RLE with soap and water, then applied a layer of Sween Moisturizing Ointment. I placed calcium alginate over both wound areas, then telfa non-adherent pads. Unna paste wrap was applied, then kerlix, then coban  ? 01/29/22 1206  ? ?  ?  ? ?  ? ? Contact information for follow-up providers   ? ? [name], MD. Schedule an appointment as soon as possible for a visit in 10 day(s).   ?Specialty: Family Medicine ?Why: hospital f/u ?Contact information: ?[address] ?[place] [address] ?[phone] ? ? ?  ?  ? ?  ?  ? ? Contact information for after-discharge care   ? ? Destination   ? ? [facility] .   ?Service: Skilled Nursing ?Contact information: ?[address] ?[place] ?[phone] ? ?  ?  ? ?  ?  ? ?  ?  ? ?  ? ?Discharge Exam: ?Danley Danker Weights  ? 01/26/22 1415  ?Weight: 90.3 kg  ? ? ? ?Condition at discharge: fair ? ?The results of significant diagnostics from this hospitalization (including imaging, microbiology, ancillary and laboratory) are listed below for reference.   ? ?Imaging Studies: ?DG Tibia/Fibula Left ? ?Result Date: 01/26/2022 ?CLINICAL DATA:  Redness EXAM: LEFT TIBIA AND FIBULA - 2 VIEW COMPARISON:  None. FINDINGS: No acute fracture or dislocation identified. No bone destruction or periosteal reaction. Vascular calcifications noted. IMPRESSION: No acute osseous abnormality identified. Electronically Signed   By: Ofilia Neas M.D.   On: 01/26/2022 14:14  ? ?US Venous Img Lower Unilateral Left (DVT) ? ?Result Date: 01/26/2022 ?CLINICAL DATA:  Swelling and redness of lower legs. EXAM: LEFT LOWER EXTREMITY VENOUS DOPPLER ULTRASOUND TECHNIQUE: Gray-scale sonography with graded compression, as well as color Doppler and duplex ultrasound  were performed to evaluate the lower extremity deep venous systems from the level of the common femoral vein and including the common femoral, femoral, profunda femoral, popliteal and calf veins including the posterior tibial, peroneal and gastrocnemius veins when visible. The superficial great saphenous vein was also interrogated. Spectral Doppler was utilized to evaluate flow at rest and with distal augmentation maneuvers in the common femoral, femoral and popliteal veins. COMPARISON:  08/15/2017 FINDINGS: Contralateral Common Femoral Vein: Respiratory phasicity is normal and symmetric with the symptomatic side. No evidence of thrombus. Normal compressibility. Common Femoral Vein: No evidence of thrombus. Normal compressibility, respiratory phasicity and response to augmentation. Saphenofemoral Junction: No evidence of thrombus. Normal compressibility and flow on color Doppler imaging. Profunda Femoral Vein: No evidence of thrombus. Normal compressibility and flow on color Doppler imaging. Femoral Vein: No evidence of thrombus. Normal compressibility, respiratory phasicity and response to augmentation. Popliteal Vein: No evidence of thrombus. Normal compressibility, respiratory phasicity and response to augmentation. Calf Veins: No  evidence of thrombus. Normal compressibility and flow on color Doppler imaging. Other Findings:  None. IMPRESSION: Negative for deep venous thrombosis in left lower extremity. Electronically Signed   By: Scherrie Gerlach.D.

## 2022-01-29 NOTE — Progress Notes (Signed)
Occupational Therapy Treatment ?Patient Details ?Name: [name] ?MRN: [id number] ?DOB: Jul 20, 1946 ?Today's Date: 01/29/2022 ? ? ?History of present illness Pt is a 76 y/o F admitted on 01/26/22 after presenting with c/c of pain in LLE & foot. Pt reports she stepped on a tac 4 days ago & has had worsening pain since. Pt is being treated for LLE cellulitis. PMH: HTN, asthma, OSA on CPAP, kidney stone, DVT not on anticoagulants, cognitive impairment, chronic venous insufficiency, diverticulosis, B hearing loss, hiatal hernia, restless leg syndrome, vitamin D deficiency ?  ?OT comments ? Pt seen for OT tx this date. Pt eager to use the bathroom upon OT's arrival. CGA from recliner with RW + VC for hand placement. Pt required CGA with VC for RW mgt to negotiate room to allow for bathroom door to fully open. Pt required use of grab bar and MIN A for toilet transfers from std height toilet after declining use of BSC frame over toilet 2/2 comfort. Pt completed some pericare in sitting using lateral lean, CGA in standing to complete pericare. Pt endorsed 4/10 LLE pain with walking. Pt continues to benefit from skilled OT services. Continue to recommend SNF.   ? ?Recommendations for follow up therapy are one component of a multi-disciplinary discharge planning process, led by the attending physician.  Recommendations may be updated based on patient status, additional functional criteria and insurance authorization. ?   ?Follow Up Recommendations ? Skilled nursing-short term rehab (<3 hours/day)  ?  ?Assistance Recommended at Discharge Frequent or constant Supervision/Assistance  ?Patient can return home with the following ? A little help with walking and/or transfers;A little help with bathing/dressing/bathroom;Assistance with cooking/housework;Assist for transportation;Direct supervision/assist for financial management;Direct supervision/assist for medications management;Help with stairs or ramp for entrance ?  ?Equipment  Recommendations ? BSC/3in1  ?  ?Recommendations for Other Services   ? ?  ?Precautions / Restrictions Precautions ?Precautions: Fall ?Restrictions ?Weight Bearing Restrictions: No  ? ? ?  ? ?Mobility Bed Mobility ?  ?  ?  ?  ?  ?  ?  ?General bed mobility comments: NT, up in recliner at start and end of session ?  ? ?Transfers ?Overall transfer level: Needs assistance ?Equipment used: Rolling walker (2 wheels) ?Transfers: Sit to/from Stand ?Sit to Stand: Min guard, Min assist ?  ?  ?  ?  ?  ?General transfer comment: VC for hand placement and RW mgt from recliner, MIN A from std height toilet with use of grab bar ?  ?  ?Balance Overall balance assessment: Needs assistance ?Sitting-balance support: Feet supported ?Sitting balance-Leahy Scale: Good ?  ?  ?Standing balance support: Bilateral upper extremity supported, During functional activity, No upper extremity supported ?Standing balance-Leahy Scale: Fair ?Standing balance comment: able to stand and complete clothing mgt wiht MIN A and no LOB wihtout UE support on RW ?  ?  ?  ?  ?  ?  ?  ?  ?  ?  ?  ?   ? ?ADL either performed or assessed with clinical judgement  ? ?ADL Overall ADL's : Needs assistance/impaired ?  ?  ?Grooming: Supervision/safety;Standing;Wash/dry hands ?  ?  ?  ?  ?  ?  ?  ?  ?  ?Toilet Transfer: Minimal assistance;Min guard;Regular Toilet;Grab bars;Rolling walker (2 wheels) ?Toilet Transfer Details (indicate cue type and reason): increased difficulty but pt refused BSC over toilet due to discomfort ?Toileting- Clothing Manipulation and Hygiene: Modified independent;Sitting/lateral lean ?  ?  ?  ?Functional  mobility during ADLs: Min guard;Rolling walker (2 wheels) ?  ?  ? ?Extremity/Trunk Assessment   ?  ?  ?  ?  ?  ? ?Vision   ?  ?  ?Perception   ?  ?Praxis   ?  ? ?Cognition Arousal/Alertness: Awake/alert ?Behavior During Therapy: [facility] for tasks assessed/performed ?Overall Cognitive Status: Within Functional Limits for tasks assessed ?  ?  ?  ?  ?   ?  ?  ?  ?  ?  ?  ?  ?  ?  ?  ?  ?  ?  ?  ?   ?Exercises   ? ?  ?Shoulder Instructions   ? ? ?  ?General Comments    ? ? ?Pertinent Vitals/ Pain       Pain Assessment ?Pain Assessment: 0-10 ?Pain Score: 4  ?Pain Location: LLE wiht weightbearing ?Pain Descriptors / Indicators: Discomfort ?Pain Intervention(s): Limited activity within patient's tolerance, Monitored during session, Repositioned ? ?Home Living   ?  ?  ?  ?  ?  ?  ?  ?  ?  ?  ?  ?  ?  ?  ?  ?  ?  ?  ? ?  ?Prior Functioning/Environment    ?  ?  ?  ?   ? ?Frequency ?    ? ? ? ? ?  ?Progress Toward Goals ? ?OT Goals(current goals can now be found in the care plan section) ? Progress towards OT goals: Progressing toward goals ? ?Acute Rehab OT Goals ?Patient Stated Goal: get stronger at rehab before going home to my cats ?OT Goal Formulation: With patient ?Time For Goal Achievement: 02/10/22 ?Potential to Achieve Goals: Good  ?Plan Discharge plan remains appropriate;Frequency remains appropriate   ? ?Co-evaluation ? ? ?   ?  ?  ?  ?  ? ?  ?AM-PAC OT "6 Clicks" Daily Activity     ?Outcome Measure ? ? Help from another person eating meals?: None ?Help from another person taking care of personal grooming?: A Little ?Help from another person toileting, which includes using toliet, bedpan, or urinal?: A Little ?Help from another person bathing (including washing, rinsing, drying)?: A Lot ?Help from another person to put on and taking off regular upper body clothing?: A Little ?Help from another person to put on and taking off regular lower body clothing?: A Lot ?6 Click Score: 17 ? ?  ?End of Session Equipment Utilized During Treatment: Rolling walker (2 wheels) ? ?OT Visit Diagnosis: Other abnormalities of gait and mobility (R26.89);Muscle weakness (generalized) (M62.81) ?  ?Activity Tolerance Patient tolerated treatment well ?  ?Patient Left in chair;with call bell/phone within reach;with chair alarm set ?  ?Nurse Communication   ?  ? ?   ? ?Time:  1610-9604 ?OT Time Calculation (min): 23 min ? ?Charges: OT General Charges ?$OT Visit: 1 Visit ?OT Treatments ?$Self Care/Home Management : 23-37 mins ? ?Ardeth Perfect., MPH, MS, OTR/L ?ascom [phone] ?01/29/22, 1:01 PM ?

## 2022-01-29 NOTE — TOC Progression Note (Addendum)
Transition of Care (TOC) - Progression Note  ? ? ?Patient Details  ?Name: ARWA YERO ?MRN: [id number] ?Date of Birth: [birth year] ? ?Transition of Care (TOC) CM/SW Contact  ?Sadarius Norman A Cornelius Schuitema, LCSW ?Phone Number: ?01/29/2022, 9:20 AM ? ?Clinical Narrative:   [facility] can offer a bed. CSW to notify pt. ? ?Pt is aware and agreeable to dc to [facility] ? ? ? ?Expected Discharge Plan: [place] ?Barriers to Discharge: Continued Medical Work up ? ?Expected Discharge Plan and Services ?Expected Discharge Plan: [place] ?  ?  ?  ?Living arrangements for the past 2 months: [place] ?                ?  ?  ?  ?  ?  ?  ?  ?  ?  ?  ? ? ?Social Determinants of Health (SDOH) Interventions ?  ? ?Readmission Risk Interventions ?   ? View : No data to display.  ?  ?  ?  ? ? ?

## 2022-01-29 NOTE — Discharge Instructions (Signed)
Unna boot change every monday ?

## 2022-01-29 NOTE — Consult Note (Signed)
WOC Nurse Consult Note: ?Patient receiving care in [facility] ?Reason for Consult: replacement of RLE unna boot ?Wound type: venous stasis and infectious per patient's story related to me at time of my visit. ?Pressure Injury POA: Yes/No/NA ?Measurement:Right medial ankle wound approximately 1 cm x 1 cm x 0.3 cm.  Pink/tan drainage. ?Right posterior leg wound irregularly shaped, shallow and rougly 7 cm x 10 cm and without measureable depth. Wound base is pink, no drainage. ?Wound bed: ?Drainage (amount, consistency, odor)  ?Periwound: intact ?Dressing procedure/placement/frequency: ?I explained to the patient that we do no carry the hydrofera blue (present to the posterior calf wound), or the absorptive dressing over that.  Today I cleaned the RLE with soap and water, then applied a layer of Sween Moisturizing Ointment. I placed calcium alginate over both wound areas, then telfa non-adherent pads. Unna paste wrap was applied, then kerlix, then coban.  Patient tolerated very well. ? ?The wrap gets changed weekly. ? ?Val Riles, RN, MSN, CWOCN, CNS-BC, pager [phone]  ?

## 2022-01-29 NOTE — TOC Transition Note (Signed)
Transition of Care (TOC) - CM/SW Discharge Note ? ? ?Patient Details  ?Name: [name] ?MRN: [id number] ?Date of Birth: 05-Jul-1946 ? ?Transition of Care (TOC) CM/SW Contact:  ?Amai Cappiello A Muaad Boehning, LCSW ?Phone Number: ?01/29/2022, 12:20 PM ? ? ?Clinical Narrative:  CSW will go to [facility] today. [facility] will not transport to [place] unable to assist. CSW spoke with pt and pt state her family can take her as long as we can wheel her down and help her get in the care. MD is agreeable. Pt will go to room 114. RN to call report to  [phone]. NO additional needs. ? ? ? ?Final next level of care: [place] ?Barriers to Discharge: No Barriers Identified ? ? ?Patient Goals and CMS Choice ?  ?  ?  ? ?Discharge Placement ?  ?           ?Patient chooses bed at:  [facility]) ?Patient to be transferred to facility by: Pt's family ?  ?Patient and family notified of of transfer: 01/29/22 ? ?Discharge Plan and Services ?  ?  ?           ?  ?  ?  ?  ?  ?  ?  ?  ?  ?  ? ?Social Determinants of Health (SDOH) Interventions ?  ? ? ?Readmission Risk Interventions ?   ? View : No data to display.  ?  ?  ?  ? ? ? ? ? ?

## 2022-01-30 ENCOUNTER — Telehealth: Payer: Self-pay | Admitting: Family Medicine

## 2022-01-30 ENCOUNTER — Telehealth: Payer: Self-pay | Admitting: Pulmonary Disease

## 2022-01-30 DIAGNOSIS — R5381 Other malaise: Secondary | ICD-10-CM

## 2022-01-30 DIAGNOSIS — G8929 Other chronic pain: Secondary | ICD-10-CM

## 2022-01-30 DIAGNOSIS — M79604 Pain in right leg: Secondary | ICD-10-CM

## 2022-01-30 DIAGNOSIS — G4733 Obstructive sleep apnea (adult) (pediatric): Secondary | ICD-10-CM

## 2022-01-30 DIAGNOSIS — L03116 Cellulitis of left lower limb: Secondary | ICD-10-CM

## 2022-01-30 DIAGNOSIS — I872 Venous insufficiency (chronic) (peripheral): Secondary | ICD-10-CM

## 2022-01-30 NOTE — Telephone Encounter (Signed)
Pt states that she will be at her sisters for a couple of days and you can reach her at [phone]. ?

## 2022-01-30 NOTE — Telephone Encounter (Signed)
Lm for patient.  

## 2022-01-30 NOTE — Telephone Encounter (Signed)
Pt called stating that she went to a facility for rehab called [facility] for PT. Pt states that the facility was horrible. Pt is asking if she can get PT orders. Please advise. ?

## 2022-01-31 DIAGNOSIS — R5381 Other malaise: Secondary | ICD-10-CM | POA: Insufficient documentation

## 2022-01-31 LAB — CULTURE, BLOOD (ROUTINE X 2)
Culture: NO GROWTH
Culture: NO GROWTH

## 2022-01-31 NOTE — Telephone Encounter (Signed)
Spoke to patient by telephone and was advised that she was in the hospital. Patient stated when she left the hospital and her brother-in-law took her to a rehab facility in [place], [place]. Patient stated the name of the facility is [facility] and she only stayed about an hour and a half. Patient stated that the facility was more like a hospice facility. Patient stated that she is now staying with her sister which is next door to her own  home. Patient stated that she is asking for outpatient physical therapy. Patient stated that she has been using a walker but was in a wheelchair. Patient stated that she needs help getting her strength back.  ?Patient stated that there is a facility in [facility] but not sure of the name of it. Patient stated that she will get the name of the facility and call back with the information. ?Patient stated she also needs a walker. Patient stated that she will get the information on where the order needs to go for the wheelchair.  ?

## 2022-01-31 NOTE — Telephone Encounter (Addendum)
Thank you. ?New order placed to outpt PT  ?Rx written and in Lisa's box for walker.  ?

## 2022-01-31 NOTE — Telephone Encounter (Signed)
Pt has called again wanting to know where to go for PT, please advise  ? ?[phone] ?

## 2022-01-31 NOTE — Telephone Encounter (Signed)
Patient called back stating that the PT facility is part of the [facility].  ? ?Patient stated the DME company that would be used for her walker is [facility] which is local and that is where she gets her CPAP equipment from.  ?Telephone [phone] ?

## 2022-01-31 NOTE — Telephone Encounter (Signed)
Can we clarify what she's asking for? Is this a request for outpatient or HH PT? She was recently hospitalized for leg cellulitis.  ?What city is she currently staying in? Does she have transportation?  ?

## 2022-01-31 NOTE — Addendum Note (Signed)
Addended by: [name] on: 01/31/2022 04:54 PM ? ? Modules accepted: Orders ? ?

## 2022-02-01 ENCOUNTER — Telehealth: Payer: Self-pay | Admitting: Family Medicine

## 2022-02-01 NOTE — Telephone Encounter (Signed)
Spoke with the pt  ?She states that her CPAP keeps cutting off in the night  ?She has confirmed with [facility] that she is due for a new unit since she has had this machine for 7 years  ?Please advise if okay to go ahead and place order  ?Pt due for appt July 2023 ?Thanks! ?

## 2022-02-01 NOTE — Telephone Encounter (Signed)
Pt is staying at her sister's house please call [phone] ?

## 2022-02-01 NOTE — Telephone Encounter (Signed)
Patient calling to see if her referral for PT to [facility] could be authorized, states they could see her tomorrow, but her referral is not authorized. ? ?Please let the patient know when authorized, states she is not rushing anyone and could wait until next week ?

## 2022-02-01 NOTE — Telephone Encounter (Signed)
Patient wanting to know if her referral can be authorized soon, she called [facility],  ?

## 2022-02-01 NOTE — Telephone Encounter (Signed)
Faxed written rx to [facility] at [phone]. ?

## 2022-02-02 DIAGNOSIS — J45909 Unspecified asthma, uncomplicated: Secondary | ICD-10-CM | POA: Diagnosis not present

## 2022-02-02 DIAGNOSIS — I10 Essential (primary) hypertension: Secondary | ICD-10-CM | POA: Diagnosis not present

## 2022-02-02 DIAGNOSIS — I87331 Chronic venous hypertension (idiopathic) with ulcer and inflammation of right lower extremity: Secondary | ICD-10-CM | POA: Diagnosis not present

## 2022-02-02 DIAGNOSIS — Z85820 Personal history of malignant melanoma of skin: Secondary | ICD-10-CM | POA: Diagnosis not present

## 2022-02-02 DIAGNOSIS — G4733 Obstructive sleep apnea (adult) (pediatric): Secondary | ICD-10-CM | POA: Diagnosis not present

## 2022-02-02 DIAGNOSIS — I872 Venous insufficiency (chronic) (peripheral): Secondary | ICD-10-CM | POA: Diagnosis not present

## 2022-02-02 DIAGNOSIS — L97811 Non-pressure chronic ulcer of other part of right lower leg limited to breakdown of skin: Secondary | ICD-10-CM | POA: Diagnosis not present

## 2022-02-02 DIAGNOSIS — I890 Lymphedema, not elsewhere classified: Secondary | ICD-10-CM | POA: Diagnosis not present

## 2022-02-02 NOTE — Telephone Encounter (Signed)
Rigoberto Noel, MD ?to Me  Colon Branch, CMA   ?   3:19 PM ?Okay to send treatment for replacement CPAP 12 cm.  ?Please ensure 1 year follow-up appointment ? ? ?I spoke with the pt and notified of response per Dr Elsworth Soho. Order sent to [facility].  ? ?

## 2022-02-02 NOTE — Progress Notes (Signed)
[name], Amber ([id number]) ?Visit Report for 02/02/2022 ?Arrival Information Details ?Patient Name: [name], [name] ?Date of Service: 02/02/2022 1:00 PM ?Medical Record Number: [id number] ?Patient Account Number: [id number] ?Date of Birth/Sex: [birth year] (76 y.o. F) ?Treating RN: Donnamarie Poag ?Primary Care Tiphanie Vo: [name] Other Clinician: ?Referring Shondra Capps: [name] ?Treating Tawyna Pellot/Extender: Jeri Cos ?Weeks in Treatment: 9 ?Visit Information History Since Last Visit ?Added or deleted any medications: No ?Patient Arrived: Wheel Chair ?Had a fall or experienced change in No ?Arrival Time: 13:10 ?activities of daily living that may affect ?Accompanied By: self ?risk of falls: ?Transfer Assistance: EasyPivot Patient Lift ?Hospitalized since last visit: Yes ?Patient Identification Verified: Yes ?Has Dressing in Place as Prescribed: Yes ?Secondary Verification Process Completed: Yes ?Has Compression in Place as Prescribed: Yes ?Patient Requires Transmission-Based No ?Pain Present Now: Yes ?Precautions: ?Patient Has Alerts: Yes ?Patient Alerts: Patient on Blood ?Thinner ?'81mg'$  aspirin ?[place] [facility] ?Electronic Signature(s) ?Signed: 02/02/2022 2:50:48 PM By: Donnamarie Poag ?Entered ByDonnamarie Poag on 02/02/2022 13:10:52 ?[name], Amber ([id number]) ?-------------------------------------------------------------------------------- ?Compression Therapy Details ?Patient Name: [name], Amber ?Date of Service: 02/02/2022 1:00 PM ?Medical Record Number: [id number] ?Patient Account Number: [id number] ?Date of Birth/Sex: Mar 14, 1946 (76 y.o. F) ?Treating RN: Donnamarie Poag ?Primary Care Dreshaun Stene: [name] Other Clinician: ?Referring Fain [name]: [name] ?Treating Berit Raczkowski/Extender: Jeri Cos ?Weeks in Treatment: 9 ?Compression Therapy Performed for Wound Assessment: Wound #15 Right,Posterior Lower Leg ?Performed By: Clinician Donnamarie Poag, RN ?Compression Type: Three Layer ?Electronic  Signature(s) ?Signed: 02/02/2022 2:50:48 PM By: Donnamarie Poag ?Entered ByDonnamarie Poag on 02/02/2022 13:17:45 ?JAMILEE, LAFOSSE ([id number]) ?-------------------------------------------------------------------------------- ?Encounter Discharge Information Details ?Patient Name: [name], Amber ?Date of Service: 02/02/2022 1:00 PM ?Medical Record Number: [id number] ?Patient Account Number: [id number] ?Date of Birth/Sex: Nov 04, 1946 (76 y.o. F) ?Treating RN: Donnamarie Poag ?Primary Care Leonides Minder: [name] Other Clinician: ?Referring Pari Lombard: [name] ?Treating Keshun Berrett/Extender: Jeri Cos ?Weeks in Treatment: 9 ?Encounter Discharge Information Items ?Discharge Condition: Stable ?Ambulatory Status: Wheelchair ?Discharge Destination: Home ?Transportation: Private Auto ?Accompanied By: self ?Schedule Follow-up Appointment: Yes ?Clinical Summary of Care: ?Electronic Signature(s) ?Signed: 02/02/2022 2:50:48 PM By: Donnamarie Poag ?Entered ByDonnamarie Poag on 02/02/2022 13:27:33 ?IMARA, STANDIFORD ([id number]) ?-------------------------------------------------------------------------------- ?Wound Assessment Details ?Patient Name: [name], Amber ?Date of Service: 02/02/2022 1:00 PM ?Medical Record Number: [id number] ?Patient Account Number: [id number] ?Date of Birth/Sex: [birth year] (76 y.o. F) ?Treating RN: Donnamarie Poag ?Primary Care Katti Pelle: [name] Other Clinician: ?Referring Hanaa Payes: [name] ?Treating [name] Soberanis/Extender: Jeri Cos ?Weeks in Treatment: 9 ?Wound Status ?Wound Number: 15 Primary Lymphedema ?Etiology: ?Wound Location: Right, Posterior Lower Leg ?Secondary Venous Leg Ulcer ?Wounding Event: Gradually Appeared ?Etiology: ?Date Acquired: 10/27/2021 ?Wound Open ?Weeks Of Treatment: 9 ?Status: ?Clustered Wound: No ?Notes: wound is boxed in due to odd shape ?Comorbid Cataracts, Asthma, Sleep Apnea, Deep Vein Thrombosis, ?History: Hypertension, Peripheral Venous Disease, Osteoarthritis, ?Received  Chemotherapy, Received Radiation ?Wound Measurements ?Length: (cm) 5.6 ?Width: (cm) 6.3 ?Depth: (cm) 0.2 ?Area: (cm?) 27.709 ?Volume: (cm?) 5.542 ?% Reduction in Area: 70.4% ?% Reduction in Volume: 40.7% ?Epithelialization: Small (1-33%) ?Wound Description ?Classification: Full Thickness Without Exposed Support Structu ?Wound Margin: Indistinct, nonvisible ?Exudate Amount: Large ?Exudate Type: Serosanguineous ?Exudate Color: red, brown ?res Foul Odor After Cleansing: No ?Slough/Fibrino Yes ?Wound Bed ?Granulation Amount: Medium (34-66%) Exposed Structure ?Granulation Quality: Pink, Pale ?Fascia Exposed: No ?Necrotic Amount: Medium (34-66%) ?Fat Layer (Subcutaneous Tissue) Exposed: Yes ?Necrotic Quality: Adherent Slough ?Tendon Exposed: No ?Muscle Exposed: No ?Joint Exposed: No ?Bone Exposed: No ?Treatment Notes ?Wound #15 (Lower Leg) Wound Laterality:  Right, Posterior ?Cleanser ?Soap and Water ?Discharge Instruction: Gently cleanse wound with antibacterial soap, rinse and pat dry prior to dressing wounds ?Peri-Wound Care ?Triamcinolone Acetonide Cream, 0.1%, 15 (g) tube ?Discharge Instruction: PERI WOUND-Apply as directed. ?Topical ?Primary Dressing ?Hydrofera Blue Ready Transfer Foam, 4x5 (in/in) ?Discharge Instruction: Apply Hydrofera Blue Ready to wound bed as directed ?Secondary Dressing ?Xtrasorb Large 6x9 (in/in) ?SHIRLIE, ENCK ([id number]) ?Discharge Instruction: Apply to wound as directed. Do not cut. ?Secured With ?Compression Wrap ?3-LAYER WRAP - Profore Lite LF 3 Multilayer Compression Bandaging System ?Discharge Instruction: Apply 3 multi-layer wrap as prescribed. ?Compression Stockings ?Add-Ons ?Electronic Signature(s) ?Signed: 02/02/2022 2:50:48 PM By: Donnamarie Poag ?Entered ByDonnamarie Poag on 02/02/2022 13:17:07 ?AOI, KOUNS ([id number]) ?-------------------------------------------------------------------------------- ?Wound Assessment Details ?Patient Name: [name], Amber ?Date of Service:  02/02/2022 1:00 PM ?Medical Record Number: [id number] ?Patient Account Number: [id number] ?Date of Birth/Sex: April 23, 1946 (76 y.o. F) ?Treating RN: Donnamarie Poag ?Primary Care Kennedi Lizardo: [name] Other Clinician: ?Referring Nathaniel Yaden: [name] ?Treating Deshay Kirstein/Extender: Jeri Cos ?Weeks in Treatment: 9 ?Wound Status ?Wound Number: 16 Primary Lymphedema ?Etiology: ?Wound Location: Right, Medial Lower Leg ?Secondary Venous Leg Ulcer ?Wounding Event: Gradually Appeared ?Etiology: ?Date Acquired: 10/26/2021 ?Wound Open ?Weeks Of Treatment: 5 ?Status: ?Clustered Wound: No ?Comorbid Cataracts, Asthma, Sleep Apnea, Deep Vein Thrombosis, ?History: Hypertension, Peripheral Venous Disease, Osteoarthritis, ?Received Chemotherapy, Received Radiation ?Wound Measurements ?Length: (cm) 2 ?Width: (cm) 1.5 ?Depth: (cm) 0.3 ?Area: (cm?) 2.356 ?Volume: (cm?) 0.707 ?% Reduction in Area: -42.9% ?% Reduction in Volume: -328.5% ?Epithelialization: None ?Wound Description ?Classification: Full Thickness Without Exposed Support Structures ?Exudate Amount: Medium ?Exudate Type: Serosanguineous ?Exudate Color: red, brown ?Foul Odor After Cleansing: No ?Slough/Fibrino Yes ?Wound Bed ?Granulation Amount: Small (1-33%) Exposed Structure ?Granulation Quality: Pink, Hyper-granulation ?Fat Layer (Subcutaneous Tissue) Exposed: Yes ?Necrotic Amount: Large (67-100%) ?Necrotic Quality: Adherent Slough ?Treatment Notes ?Wound #16 (Lower Leg) Wound Laterality: Right, Medial ?Cleanser ?Soap and Water ?Discharge Instruction: Gently cleanse wound with antibacterial soap, rinse and pat dry prior to dressing wounds ?Peri-Wound Care ?Triamcinolone Acetonide Cream, 0.1%, 15 (g) tube ?Discharge Instruction: PERI WOUND-Apply as directed. ?Topical ?Primary Dressing ?Hydrofera Blue Ready Transfer Foam, 4x5 (in/in) ?Discharge Instruction: Apply Hydrofera Blue Ready to wound bed as directed ?Secondary Dressing ?Xtrasorb Large 6x9 (in/in) ?Discharge  Instruction: Apply to wound as directed. Do not cut. ?Secured With ?Compression Wrap ?3-LAYER WRAP - Profore Lite LF 3 Multilayer Compression Bandaging System ?JUHI, LAGRANGE ([id number]) ?Discharge Instruction: Apply 3

## 2022-02-02 NOTE — Progress Notes (Signed)
[name], [name] ([id number]) ?Visit Report for 02/02/2022 ?Physician Orders Details ?Patient Name: [name], [name] ?Date of Service: 02/02/2022 1:00 PM ?Medical Record Number: [id number] ?Patient Account Number: [id number] ?Date of Birth/Sex: February 17, 1946 (76 y.o. F) ?Treating RN: Donnamarie Poag ?Primary Care Provider: [name] Other Clinician: ?Referring Provider: [name] ?Treating Provider/Extender: Jeri Cos ?Weeks in Treatment: 9 ?Verbal / Phone Orders: No ?Diagnosis Coding ?Follow-up Appointments ?o Return Appointment in 1 week. ?o Nurse Visit as needed ?Bathing/ Shower/ Hygiene ?o May shower with wound dressing protected with water repellent cover or cast protector. ?o No tub bath. ?Anesthetic (Use 'Patient Medications' Section for Anesthetic Order Entry) ?o Lidocaine applied to wound bed ?Edema Control - Lymphedema / Segmental Compressive Device / Other ?o Optional: One layer of unna paste to top of compression wrap (to act as an anchor). ?o Elevate, Exercise Daily and Avoid Standing for Long Periods of Time. ?o Elevate leg(s) parallel to the floor when sitting. ?o DO YOUR BEST to sleep in the bed at night. DO NOT sleep in your recliner. Long hours of sitting in a recliner leads to ?swelling of the legs and/or potential wounds on your backside. ?Wound Treatment ?Wound #15 - Lower Leg Wound Laterality: Right, Posterior ?Cleanser: Soap and Water 2 x Per Week/15 Days ?Discharge Instructions: Gently cleanse wound with antibacterial soap, rinse and pat dry prior to dressing wounds ?Peri-Wound Care: Triamcinolone Acetonide Cream, 0.1%, 15 (g) tube 2 x Per Week/15 Days ?Discharge Instructions: PERI WOUND-Apply as directed. ?Primary Dressing: Hydrofera Blue Ready Transfer Foam, 4x5 (in/in) 2 x Per Week/15 Days ?Discharge Instructions: Apply Hydrofera Blue Ready to wound bed as directed ?Secondary Dressing: Xtrasorb Large 6x9 (in/in) 2 x Per Week/15 Days ?Discharge Instructions: Apply to  wound as directed. Do not cut. ?Compression Wrap: 3-LAYER WRAP - Profore Lite LF 3 Multilayer Compression Bandaging System 2 x Per Week/15 Days ?Discharge Instructions: Apply 3 multi-layer wrap as prescribed. ?Wound #16 - Lower Leg Wound Laterality: Right, Medial ?Cleanser: Soap and Water 1 x Per Week/15 Days ?Discharge Instructions: Gently cleanse wound with antibacterial soap, rinse and pat dry prior to dressing wounds ?Peri-Wound Care: Triamcinolone Acetonide Cream, 0.1%, 15 (g) tube 1 x Per Week/15 Days ?Discharge Instructions: PERI WOUND-Apply as directed. ?Primary Dressing: Hydrofera Blue Ready Transfer Foam, 4x5 (in/in) 1 x Per Week/15 Days ?Discharge Instructions: Apply Hydrofera Blue Ready to wound bed as directed ?Secondary Dressing: Xtrasorb Large 6x9 (in/in) 1 x Per Week/15 Days ?Discharge Instructions: Apply to wound as directed. Do not cut. ?Compression Wrap: 3-LAYER WRAP - Profore Lite LF 3 Multilayer Compression Bandaging System 1 x Per Week/15 Days ?Discharge Instructions: Apply 3 multi-layer wrap as prescribed. ?[name], [name] ([id number]) ?Electronic Signature(s) ?Signed: 02/02/2022 2:50:48 PM By: Donnamarie Poag ?Signed: 02/02/2022 4:34:33 PM By: Worthy Keeler PA-C ?Entered ByDonnamarie Poag on 02/02/2022 13:18:29 ?[name], [name] ([id number]) ?-------------------------------------------------------------------------------- ?SuperBill Details ?Patient Name: [name], [name] ?Date of Service: 02/02/2022 ?Medical Record Number: [id number] ?Patient Account Number: [id number] ?Date of Birth/Sex: 1946-01-26 (76 y.o. F) ?Treating RN: Donnamarie Poag ?Primary Care Provider: [name] Other Clinician: ?Referring Provider: [name] ?Treating Provider/Extender: Jeri Cos ?Weeks in Treatment: 9 ?Diagnosis Coding ?ICD-10 Codes ?Code Description ?I89.0 Lymphedema, not elsewhere classified ?I87.331 Chronic venous hypertension (idiopathic) with ulcer and inflammation of right lower extremity ?L97.811  Non-pressure chronic ulcer of other part of right lower leg limited to breakdown of skin ?Facility Procedures ?CPT4 Code: 39030092 ?Description: (Facility Use Only) [id number] - APPLY MULTLAY COMPRS LWR RT LEG ?Modifier: ?Quantity: 1 ?Electronic  Signature(s) ?Signed: 02/02/2022 2:50:48 PM By: Donnamarie Poag ?Signed: 02/02/2022 4:34:33 PM By: Worthy Keeler PA-C ?Entered ByDonnamarie Poag on 02/02/2022 13:32:31 ?

## 2022-02-04 ENCOUNTER — Other Ambulatory Visit: Payer: Self-pay | Admitting: Family Medicine

## 2022-02-05 ENCOUNTER — Ambulatory Visit: Payer: Medicare Other | Admitting: Physician Assistant

## 2022-02-05 DIAGNOSIS — R413 Other amnesia: Secondary | ICD-10-CM | POA: Diagnosis not present

## 2022-02-06 NOTE — Telephone Encounter (Signed)
[facility] (Outpatient) ?[address], [address] ?[place], [place] [address] ?PHONE: [phone] ?Fax: [phone] ? ?Unable to find location in my electronic faxing through [employer] - unable to attach records to referral due to not being able to location facility.  ? ?Not able to process this external referral from a remote stand point. Referral will need to be printed and faxed directly to their office.  ? ?Please print Referral and Dr Prudencio Pair notes and fax to the number above.  ? ?Please make patient aware when done.  ? ?Thanks! ?

## 2022-02-07 NOTE — Telephone Encounter (Signed)
Faxed referral and OV notes.  ?

## 2022-02-08 ENCOUNTER — Encounter: Payer: Medicare Other | Attending: Physician Assistant | Admitting: Physician Assistant

## 2022-02-08 DIAGNOSIS — L97212 Non-pressure chronic ulcer of right calf with fat layer exposed: Secondary | ICD-10-CM | POA: Diagnosis not present

## 2022-02-08 DIAGNOSIS — L97812 Non-pressure chronic ulcer of other part of right lower leg with fat layer exposed: Secondary | ICD-10-CM | POA: Insufficient documentation

## 2022-02-08 DIAGNOSIS — I87331 Chronic venous hypertension (idiopathic) with ulcer and inflammation of right lower extremity: Secondary | ICD-10-CM | POA: Diagnosis not present

## 2022-02-08 DIAGNOSIS — I890 Lymphedema, not elsewhere classified: Secondary | ICD-10-CM | POA: Insufficient documentation

## 2022-02-08 NOTE — Progress Notes (Signed)
ROTUNDA, WORDEN ([id number]) ?Visit Report for 02/08/2022 ?Arrival Information Details ?Patient Name: [name], [name] ?Date of Service: 02/08/2022 10:45 AM ?Medical Record Number: [id number] ?Patient Account Number: [id number] ?Date of Birth/Sex: [birth year] (76 y.o. F) ?Treating RN: Cornell Barman ?Primary Care Ieisha Gao: [name] Other Clinician: ?Referring Demontae Antunes: [name] ?Treating Naszir Cott/Extender: Jeri Cos ?Weeks in Treatment: 10 ?Visit Information History Since Last Visit ?Added or deleted any medications: No ?Patient Arrived: Ambulatory ?Has Dressing in Place as Prescribed: Yes ?Arrival Time: 10:34 ?Pain Present Now: No ?Accompanied By: self ?Transfer Assistance: None ?Patient Identification Verified: Yes ?Secondary Verification Process Completed: Yes ?Patient Requires Transmission-Based No ?Precautions: ?Patient Has Alerts: Yes ?Patient Alerts: Patient on Blood ?Thinner ?'81mg'$  aspirin ?[place] [facility] ABI ?Electronic Signature(s) ?Signed: 02/08/2022 4:47:54 PM By: [name], [name], RN, CWS, [name], [name] ?Entered By: [name], [name], RN, CWS, Kim on 02/08/2022 10:35:23 ?CIIN, BRAZZEL ([id number]) ?-------------------------------------------------------------------------------- ?Compression Therapy Details ?Patient Name: [name], [name] ?Date of Service: 02/08/2022 10:45 AM ?Medical Record Number: [id number] ?Patient Account Number: [id number] ?Date of Birth/Sex: [birth year] (76 y.o. F) ?Treating RN: Cornell Barman ?Primary Care Danashia Landers: [name] Other Clinician: ?Referring Eyana Stolze: [name] ?Treating Miachel Nardelli/Extender: Jeri Cos ?Weeks in Treatment: 10 ?Compression Therapy Performed for Wound Assessment: Wound #15 Right,Posterior Lower Leg ?Performed By: Clinician Cornell Barman, RN ?Compression Type: Three Layer ?Post Procedure Diagnosis ?Same as Pre-procedure ?Electronic Signature(s) ?Signed: 02/08/2022 4:47:54 PM By: [name], [name], RN, CWS, [name], [name] ?Entered By: [name], [name], RN, CWS, Kim on 02/08/2022  11:42:01 ?[name], [name] ([id number]) ?-------------------------------------------------------------------------------- ?Encounter Discharge Information Details ?Patient Name: [name], [name] ?Date of Service: 02/08/2022 10:45 AM ?Medical Record Number: [id number] ?Patient Account Number: [id number] ?Date of Birth/Sex: 25-Mar-1946 (76 y.o. F) ?Treating RN: Cornell Barman ?Primary Care Marvena Tally: [name] Other Clinician: ?Referring Shamanda Len: [name] ?Treating Jarel Cuadra/Extender: Jeri Cos ?Weeks in Treatment: 10 ?Encounter Discharge Information Items Post Procedure Vitals ?Discharge Condition: Stable ?Temperature (?F): 98.3 ?Ambulatory Status: Ambulatory ?Pulse (bpm): 51 ?Discharge Destination: Home ?Respiratory Rate (breaths/min): 18 ?Transportation: Private Auto ?Blood Pressure (mmHg): 163/86 ?Accompanied By: self ?Schedule Follow-up Appointment: Yes ?Clinical Summary of Care: ?Electronic Signature(s) ?Signed: 02/08/2022 4:47:54 PM By: [name], [name], RN, CWS, [name], [name] ?Entered By: [name], [name], RN, CWS, Kim on 02/08/2022 11:57:31 ?[name], [name] ([id number]) ?-------------------------------------------------------------------------------- ?Lower Extremity Assessment Details ?Patient Name: [name], [name] ?Date of Service: 02/08/2022 10:45 AM ?Medical Record Number: [id number] ?Patient Account Number: [id number] ?Date of Birth/Sex: 08-03-1946 (76 y.o. F) ?Treating RN: Cornell Barman ?Primary Care Saidah Kempton: [name] Other Clinician: ?Referring Vianny Schraeder: [name] ?Treating Shanique Aslinger/Extender: Jeri Cos ?Weeks in Treatment: 10 ?Edema Assessment ?Assessed: [Left: No] [Right: No] ?[Left: Edema] [Right: :] ?Calf ?Left: Right: ?Point of Measurement: 30 cm From Medial Instep 45 cm ?Ankle ?Left: Right: ?Point of Measurement: 13 cm From Medial Instep 30 cm ?Vascular Assessment ?Pulses: ?Dorsalis Pedis ?Palpable: [Right:Yes] ?Electronic Signature(s) ?Signed: 02/08/2022 4:47:54 PM By: [name], [name], RN, CWS, Kim  RN, [name] ?Entered By: [name], [name], RN, CWS, Kim on 02/08/2022 10:56:07 ?[name], [name] ([id number]) ?-------------------------------------------------------------------------------- ?Multi Wound Chart Details ?Patient Name: [name], [name] ?Date of Service: 02/08/2022 10:45 AM ?Medical Record Number: [id number] ?Patient Account Number: [id number] ?Date of Birth/Sex: 11-May-1946 (76 y.o. F) ?Treating RN: Cornell Barman ?Primary Care [place] Kinyon: [name] Other Clinician: ?Referring Alencia Gordon: [name] ?Treating Kierstynn Babich/Extender: Jeri Cos ?Weeks in Treatment: 10 ?Vital Signs ?Height(in): 62 ?Pulse(bpm): 51 ?Weight(lbs): 199 ?Blood Pressure(mmHg): 163/83 ?Body Mass Index(BMI): 36.4 ?Temperature(??F): 98.3 ?Respiratory Rate(breaths/min): 18 ?Photos: [N/A:N/A] ?Wound Location: Right, Posterior Lower Leg Right, Medial Lower Leg  N/A ?Wounding Event: Gradually Appeared Gradually Appeared N/A ?Primary Etiology: Lymphedema Lymphedema N/A ?Secondary Etiology: Venous Leg Ulcer Venous Leg Ulcer N/A ?Comorbid History: Cataracts, Asthma, Sleep Apnea, Cataracts, Asthma, Sleep Apnea, N/A ?Deep Vein Thrombosis, Deep Vein Thrombosis, ?Hypertension, Peripheral Venous Hypertension, Peripheral Venous ?Disease, Osteoarthritis, Received Disease, Osteoarthritis, Received ?Chemotherapy, Received Radiation Chemotherapy, Received Radiation ?Date Acquired: 10/27/2021 10/26/2021 N/A ?Weeks of Treatment: 10 6 N/A ?Wound Status: Open Open N/A ?Wound Recurrence: No No N/A ?Measurements L x W x D (cm) 8x8x0.1 2x1.7x0.3 N/A ?Area (cm?) : 50.265 2.67 N/A ?Volume (cm?) : 5.027 0.801 N/A ?% Reduction in Area: 46.20% -61.90% N/A ?% Reduction in Volume: 46.20% -385.50% N/A ?Classification: Full Thickness Without Exposed Full Thickness Without Exposed N/A ?Support Structures Support Structures ?Exudate Amount: Large Medium N/A ?Exudate Type: Serosanguineous Serosanguineous N/A ?Exudate Color: red, brown red, brown N/A ?Wound Margin: Indistinct,  nonvisible N/A N/A ?Granulation Amount: Medium (34-66%) Small (1-33%) N/A ?Granulation Quality: Pink, Pale Pink, Hyper-granulation N/A ?Necrotic Amount: Medium (34-66%) Large (67-100%) N/A ?Exposed Structures: ?Fat Layer (Subcutaneous Tissue): ?Fat Layer (Subcutaneous Tissue): N/A ?Yes Yes ?Fascia: No ?Tendon: No ?Muscle: No ?Joint: No ?Bone: No ?Epithelialization: Small (1-33%) None N/A ?Treatment Notes ?Electronic Signature(s) ?Signed: 02/08/2022 4:47:54 PM By: [name], [name], RN, CWS, [name], [name] ?EMELINE, [name] ([id number]) ?Entered By: [name], [name], RN, CWS, Kim on 02/08/2022 11:38:15 ?SHRESTA, RISDEN ([id number]) ?-------------------------------------------------------------------------------- ?Multi-Disciplinary Care Plan Details ?Patient Name: [name], [name] ?Date of Service: 02/08/2022 10:45 AM ?Medical Record Number: [id number] ?Patient Account Number: [id number] ?Date of Birth/Sex: June 06, 1946 (76 y.o. F) ?Treating RN: Cornell Barman ?Primary Care Adwoa Axe: [name] Other Clinician: ?Referring Minoru Chap: [name] ?Treating Alyxis Grippi/Extender: Jeri Cos ?Weeks in Treatment: 10 ?Active Inactive ?Necrotic Tissue ?Nursing Diagnoses: ?Impaired tissue integrity related to necrotic/devitalized tissue ?Knowledge deficit related to management of necrotic/devitalized tissue ?Goals: ?Necrotic/devitalized tissue will be minimized in the wound bed ?Date Initiated: 11/27/2021 ?Target Resolution Date: 12/25/2021 ?Goal Status: Active ?Patient/caregiver will verbalize understanding of reason and process for debridement of necrotic tissue ?Date Initiated: 11/27/2021 ?Target Resolution Date: 12/25/2021 ?Goal Status: Active ?Interventions: ?Assess patient pain level pre-, during and post procedure and prior to discharge ?Provide education on necrotic tissue and debridement process ?Notes: ?Soft Tissue Infection ?Nursing Diagnoses: ?Impaired tissue integrity ?Knowledge deficit related to disease process and  management ?Knowledge deficit related to home infection control: handwashing, handling of soiled dressings, supply storage ?Potential for infection: soft tissue ?Goals: ?Patient will remain free of wound infection ?Date Initiat

## 2022-02-09 NOTE — Progress Notes (Signed)
ROSELLE, NORTON ([id number]) ?Visit Report for 02/08/2022 ?Chief Complaint Document Details ?Patient Name: LAURAINE, CRESPO ?Date of Service: 02/08/2022 10:45 AM ?Medical Record Number: [id number] ?Patient Account Number: [id number] ?Date of Birth/Sex: Jul 04, 1946 (76 y.o. F) ?Treating RN: Cornell Barman ?Primary Care Provider: [name] Other Clinician: ?Referring Provider: [name] ?Treating Provider/Extender: Jeri Cos ?Weeks in Treatment: 10 ?Information Obtained from: Patient ?Chief Complaint ?Right LE Ulcer ?Electronic Signature(s) ?Signed: 02/08/2022 11:54:32 AM By: Worthy Keeler PA-C ?Entered By: Worthy Keeler on 02/08/2022 11:54:32 ?TAWYNA, PELLOT ([id number]) ?-------------------------------------------------------------------------------- ?Debridement Details ?Patient Name: IDY, RAWLING ?Date of Service: 02/08/2022 10:45 AM ?Medical Record Number: [id number] ?Patient Account Number: [id number] ?Date of Birth/Sex: [birth year] (76 y.o. F) ?Treating RN: Cornell Barman ?Primary Care Provider: [name] Other Clinician: ?Referring Provider: [name] ?Treating Provider/Extender: Jeri Cos ?Weeks in Treatment: 10 ?Debridement Performed for ?Wound #15 Right,Posterior Lower Leg ?Assessment: ?Performed By: Physician Tommie Sams., PA-C ?Debridement Type: Debridement ?Severity of Tissue Pre Debridement: Fat layer exposed ?Level of Consciousness (Pre- ?Awake and Alert ?procedure): ?Pre-procedure Verification/Time Out ?Yes - 11:35 ?Taken: ?Total Area Debrided (L x W): 8 (cm) x 8 (cm) = 64 (cm?) ?Tissue and other material ?Viable, Non-Viable, Slough, Subcutaneous, Fibrin/Exudate, Slough ?debrided: ?Level: Skin/Subcutaneous Tissue ?Debridement Description: Excisional ?Instrument: Curette ?Bleeding: Minimum ?Hemostasis Achieved: Pressure ?Response to Treatment: Procedure was tolerated well ?Level of Consciousness (Post- ?Awake and Alert ?procedure): ?Post Debridement Measurements of Total  Wound ?Length: (cm) 0.8 ?Width: (cm) 0.8 ?Depth: (cm) 0.3 ?Volume: (cm?) 0.151 ?Character of Wound/Ulcer Post Debridement: Stable ?Severity of Tissue Post Debridement: Fat layer exposed ?Post Procedure Diagnosis ?Same as Pre-procedure ?Electronic Signature(s) ?Signed: 02/08/2022 4:47:54 PM By: [name], [name], RN, CWS, [name], [name] ?Signed: 02/09/2022 5:15:24 PM By: Worthy Keeler PA-C ?Entered By: [name], [name], RN, CWS, Kim on 02/08/2022 11:40:00 ?ALEXZANDRIA, MASSMAN ([id number]) ?-------------------------------------------------------------------------------- ?Debridement Details ?Patient Name: KOLINA, KUBE ?Date of Service: 02/08/2022 10:45 AM ?Medical Record Number: [id number] ?Patient Account Number: [id number] ?Date of Birth/Sex: [birth year] (76 y.o. F) ?Treating RN: Cornell Barman ?Primary Care Provider: [name] Other Clinician: ?Referring Provider: [name] ?Treating Provider/Extender: Jeri Cos ?Weeks in Treatment: 10 ?Debridement Performed for ?Wound #16 Right,Medial Lower Leg ?Assessment: ?Performed By: Physician Tommie Sams., PA-C ?Debridement Type: Debridement ?Severity of Tissue Pre Debridement: Fat layer exposed ?Level of Consciousness (Pre- ?Awake and Alert ?procedure): ?Pre-procedure Verification/Time Out ?Yes - 11:35 ?Taken: ?Total Area Debrided (L x W): 2 (cm) x 1.7 (cm) = 3.4 (cm?) ?Tissue and other material ?Viable, Non-Viable, Slough, Subcutaneous, Fibrin/Exudate, Slough ?debrided: ?Level: Skin/Subcutaneous Tissue ?Debridement Description: Excisional ?Instrument: Curette ?Bleeding: Minimum ?Hemostasis Achieved: Pressure ?Response to Treatment: Procedure was tolerated well ?Level of Consciousness (Post- ?Awake and Alert ?procedure): ?Post Debridement Measurements of Total Wound ?Length: (cm) 2 ?Width: (cm) 1.7 ?Depth: (cm) 0.4 ?Volume: (cm?) 1.068 ?Character of Wound/Ulcer Post Debridement: Stable ?Severity of Tissue Post Debridement: Fat layer exposed ?Post Procedure Diagnosis ?Same as  Pre-procedure ?Electronic Signature(s) ?Signed: 02/08/2022 4:47:54 PM By: [name], [name], RN, CWS, [name], [name] ?Signed: 02/09/2022 5:15:24 PM By: Worthy Keeler PA-C ?Entered By: [name], [name], RN, CWS, Kim on 02/08/2022 11:41:18 ?CHAVON, LUCARELLI ([id number]) ?-------------------------------------------------------------------------------- ?HPI Details ?Patient Name: MAKARA, LANZO ?Date of Service: 02/08/2022 10:45 AM ?Medical Record Number: [id number] ?Patient Account Number: [id number] ?Date of Birth/Sex: [birth year] (76 y.o. F) ?Treating RN: Cornell Barman ?Primary Care Provider: [name] Other Clinician: ?Referring Provider: [name] ?Treating Provider/Extender: Jeri Cos ?Weeks in Treatment: 10 ?History of Present Illness ?HPI Description: Pleasant 76 year old with  history of chronic venous insufficiency. No diabetes or peripheral vascular disease. Left ABI 1.29. ?Questionable history of left lower extremity DVT. ?She developed a recurrent ulceration on her left lateral calf in December 2015, which she attributes to poor diet and subsequent lower extremity ?edema. She underwent endovenous laser ablation of her left greater saphenous vein in 2010. She underwent laser ablation of accessory branch of ?left GSV in April 2016 by Dr. Kellie Simmering at [facility]. She was previously wearing Unna boots, which she tolerated well. ?Tolerating 2 layer compression and cadexomer iodine. ?She returns to clinic for follow-up and is without new complaints. She denies any significant pain at this time. She reports persistent pain with ?pressure. No claudication or ischemic rest pain. No fever or chills. No drainage. ?READMISSION ?11/13/16; this is a 76 year old woman who is not a diabetic. She is here for a review of a painful area on her left medial lower extremity. I note that ?she was seen here previously last year for wound I believe to be in the same area. At that time she had undergone previously a left greater ?saphenous vein  ablation by Dr. Kellie Simmering and she had a ablation of the anterior accessory branch of the left greater saphenous vein in March 2016. ?Seeing that the wound actually closed over. In reviewing the history with her today the ulcer in this area has been recurrent. She describes a ?biopsy of this area in 2009 that only showed stasis physiology. She also has a history of today malignant melanoma in the right shoulder for which ?she follows with Dr. 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 Do not see that she has ever had arterial studies in the left leg. She tells me she has a follow-up with Dr. Kellie Simmering in roughly 10 days ?In any case she developed the reopening of this area roughly a month ago. On the background of this she describes rapidly increasing edema ?which has responded to Lasix 40 mg and metolazone 2.5 mg as well as the patient's lymph massage. She has been told she has both venous ?insufficiency and lymphedema but she cannot tolerate compression stockings ?11/28/16; the patient saw Dr. Kellie Simmering recently. Per the patient he did arterial Dopplers in the office that did not show evidence of arterial ?insufficiency, per the patient he stated "treat this like an ordinary venous ulcer". She also saw her dermatologist Dr. Ronnald Ramp who felt that this was ?more of a vascular ulcer. In general things are improving although she arrives today with increasing bilateral lower extremity edema with weeping ?a deeper fluid through the wound on the left medial leg compatible with some degree of lymphedema ?12/04/16; the patient's wound is fully epithelialized but I don't think fully healed. We will do another week of depression with Promogran and TCA ?however I suspect we'll be able to discharge her next week. This is a very unusual-looking wound which was initially a figure-of-eight type wound ?lying on its side  surrounded by petechial like hemorrhage. She has had venous ablation on this side. She apparently does not have an arterial ?issue per Dr. Kellie Simmering. She saw her dermatologist thought it was "vascular". Patient is definitely goin

## 2022-02-14 ENCOUNTER — Encounter: Payer: Self-pay | Admitting: Family Medicine

## 2022-02-14 ENCOUNTER — Ambulatory Visit (INDEPENDENT_AMBULATORY_CARE_PROVIDER_SITE_OTHER): Payer: Medicare Other | Admitting: Family Medicine

## 2022-02-14 VITALS — BP 134/78 | HR 78 | Temp 97.7°F | Ht 61.0 in | Wt 182.5 lb

## 2022-02-14 DIAGNOSIS — R4189 Other symptoms and signs involving cognitive functions and awareness: Secondary | ICD-10-CM | POA: Diagnosis not present

## 2022-02-14 DIAGNOSIS — G3184 Mild cognitive impairment, so stated: Secondary | ICD-10-CM | POA: Diagnosis not present

## 2022-02-14 DIAGNOSIS — R600 Localized edema: Secondary | ICD-10-CM

## 2022-02-14 DIAGNOSIS — E876 Hypokalemia: Secondary | ICD-10-CM | POA: Diagnosis not present

## 2022-02-14 DIAGNOSIS — I83891 Varicose veins of right lower extremities with other complications: Secondary | ICD-10-CM | POA: Diagnosis not present

## 2022-02-14 DIAGNOSIS — I872 Venous insufficiency (chronic) (peripheral): Secondary | ICD-10-CM

## 2022-02-14 DIAGNOSIS — I83893 Varicose veins of bilateral lower extremities with other complications: Secondary | ICD-10-CM | POA: Diagnosis not present

## 2022-02-14 DIAGNOSIS — I83019 Varicose veins of right lower extremity with ulcer of unspecified site: Secondary | ICD-10-CM | POA: Diagnosis not present

## 2022-02-14 DIAGNOSIS — L97919 Non-pressure chronic ulcer of unspecified part of right lower leg with unspecified severity: Secondary | ICD-10-CM

## 2022-02-14 DIAGNOSIS — I10 Essential (primary) hypertension: Secondary | ICD-10-CM

## 2022-02-14 DIAGNOSIS — L03116 Cellulitis of left lower limb: Secondary | ICD-10-CM

## 2022-02-14 DIAGNOSIS — T6591XA Toxic effect of unspecified substance, accidental (unintentional), initial encounter: Secondary | ICD-10-CM | POA: Diagnosis not present

## 2022-02-14 LAB — BASIC METABOLIC PANEL
BUN: 24 mg/dL — ABNORMAL HIGH (ref 6–23)
CO2: 31 mEq/L (ref 19–32)
Calcium: 9.7 mg/dL (ref 8.4–10.5)
Chloride: 105 mEq/L (ref 96–112)
Creatinine, Ser: 0.73 mg/dL (ref 0.40–1.20)
GFR: 80.34 mL/min (ref >60.00)
Glucose, Bld: 92 mg/dL (ref 70–99)
Potassium: 4.3 mEq/L (ref 3.5–5.1)
Sodium: 141 mEq/L (ref 135–145)

## 2022-02-14 LAB — CBC WITH DIFFERENTIAL/PLATELET
Basophils Absolute: 0 10*3/uL (ref 0.0–0.1)
Basophils Relative: 0.5 % (ref 0.0–3.0)
Eosinophils Absolute: 0.2 10*3/uL (ref 0.0–0.7)
Eosinophils Relative: 3.9 % (ref 0.0–5.0)
HCT: 37.4 % (ref 36.0–46.0)
Hemoglobin: 12.1 g/dL (ref 12.0–15.0)
Lymphocytes Relative: 28.4 % (ref 12.0–46.0)
Lymphs Abs: 1.7 10*3/uL (ref 0.7–4.0)
MCHC: 32.3 g/dL (ref 30.0–36.0)
MCV: 88.6 fl (ref 78.0–100.0)
Monocytes Absolute: 0.4 10*3/uL (ref 0.1–1.0)
Monocytes Relative: 7.5 % (ref 3.0–12.0)
Neutro Abs: 3.5 10*3/uL (ref 1.4–7.7)
Neutrophils Relative %: 59.7 % (ref 43.0–77.0)
Platelets: 218 10*3/uL (ref 150.0–400.0)
RBC: 4.22 Mil/uL (ref 3.87–5.11)
RDW: 15.9 % — ABNORMAL HIGH (ref 11.5–15.5)
WBC: 5.9 10*3/uL (ref 4.0–10.5)

## 2022-02-14 NOTE — Patient Instructions (Addendum)
Labs today  ?I'm glad you're doing better.  ?Return as needed or in 3 months for follow up visit.  ?

## 2022-02-14 NOTE — Progress Notes (Signed)
? ? Patient ID: [name], female    DOB: 12-24-1945, 76 y.o.   MRN: [id number] ? ?This visit was conducted in person. ? ?BP 134/78   Pulse 78   Temp 97.7 ?F (36.5 ?C) (Temporal)   Ht '5\' 1"'$  (1.549 m)   Wt 182 lb 8 oz (82.8 kg)   SpO2 96%   BMI 34.48 kg/m?   ? ?CC: hosp f/u visit  ?Subjective:  ? ?HPI: ?[name] is a 76 y.o. female presenting on 02/14/2022 for Hospitalization Follow-up (Admitted on 01/26/22 at [facility], dx cellulitis of low LE.  ) ? ? ?Recent hospitalization for L lower leg and foot pain that started after stepping on a tac while at home. Developed cellulitis that failed outpatient management with levaquin. Treated in hospital with IV rocephin which was transitioned to oral keflex x7d course.  ?Potassium was very low - repleted.  ?Also placed on unna booth through wound care nurse. Needs weekly change on Mondays.  ?Hospital records reviewed. Med rec performed.  ?Discharged to [facility] in [place] - horrible experience so instead came home to sister's house. Continues staying there but planning to return home as soon as able.  ? ?She was also prescribed Juven protein shake BID - took initially however stopped taking - unaffordable.  ?Currently taking lasix '40mg'$  daily, with potassium 72mq twice daily.  ? ?Home health not set up.  ?Other follow up appointments scheduled: continues seeing wound care regularly on Thursdays, gets unna boot changed at this time.  ? ?Saw Dr CBilley Gosling3/2023 - told mild cognitive impairment, referred to neuropsychology ([facility]) with planned f/u afterwards.  ?____________________________________________________________________ ?Hospital admission: 01/26/2022 ?Hospital discharge: 01/29/2022 ?TCM f/u phone call: not performed  ? ?Recommendations at discharge:  ?  ?follow-up your PCP in 1 to 2 weeks ?patient to follow-up wound clinic every week for right LE unna boot change ?  ?Discharge Diagnoses: ?cellulitis left lower extremity ? ?Consultants:  none ?Procedures performed: none  ?Disposition: Rehabilitation facility ?   ? ?Relevant past medical, surgical, family and social history reviewed and updated as indicated. Interim medical history since our last visit reviewed. ?Allergies and medications reviewed and updated. ?Outpatient Medications Prior to Visit  ?Medication Sig Dispense Refill  ? acetaminophen (TYLENOL) 650 MG CR tablet Take 2 tablets (1,300 mg total) by mouth 2 (two) times daily as needed for pain.    ? albuterol (VENTOLIN HFA) 108 (90 Base) MCG/ACT inhaler INHALE 2 PUFFS BY MOUTH EVERY 6 HOURS AS NEEDED FOR WHEEZING AND SHORTNESS OF BREATH 8.5 each 0  ? aspirin EC 81 MG tablet Take 81 mg by mouth at bedtime. Melanoma prevention    ? furosemide (LASIX) 40 MG tablet TAKE 1 TABLET 2 (TWO) TIMES DAILY AS NEEDED FOR FLUID. TAKE SECOND DOSE IF NEEDED FOR LEG SWELLING 60 tablet 1  ? KLOR-CON M20 20 MEQ tablet TAKE 1 TABLET BY MOUTH TWICE A DAY 60 tablet 3  ? loratadine (CLARITIN) 10 MG tablet Take 10 mg by mouth daily.    ? losartan (COZAAR) 50 MG tablet Take 1 tablet (50 mg total) by mouth daily. 90 tablet 1  ? montelukast (SINGULAIR) 10 MG tablet TAKE 1 TABLET BY MOUTH EVERY DAY 90 tablet 0  ? Multiple Vitamin (MULTIVITAMIN WITH MINERALS) TABS tablet Take 1 tablet by mouth daily.    ? Polyvinyl Alcohol-Povidone (TEARS PLUS OP) Place 1 drop into both eyes daily as needed (dry eyes/ redness/ burning).     ? PRESCRIPTION  MEDICATION CPAP    ? Probiotic Product (PROBIOTIC DAILY PO) Take 1 tablet by mouth daily.     ? triamcinolone (NASACORT) 55 MCG/ACT AERO nasal inhaler Place 2 sprays into the nose daily. In each nostril    ? Vitamin D, Ergocalciferol, (DRISDOL) 1.25 MG (50000 UNIT) CAPS capsule TAKE 1 CAPSULE BY MOUTH ONE TIME PER WEEK 12 capsule 0  ? nutrition supplement, JUVEN, (JUVEN) PACK Take 1 packet by mouth 2 (two) times daily between meals. 30 each 0  ? ?No facility-administered medications prior to visit.  ?  ? ?Per HPI unless specifically  indicated in ROS section below ?Review of Systems ? ?Objective:  ?BP 134/78   Pulse 78   Temp 97.7 ?F (36.5 ?C) (Temporal)   Ht '5\' 1"'$  (1.549 m)   Wt 182 lb 8 oz (82.8 kg)   SpO2 96%   BMI 34.48 kg/m?   ?Wt Readings from Last 3 Encounters:  ?02/14/22 182 lb 8 oz (82.8 kg)  ?01/26/22 199 lb (90.3 kg)  ?01/04/22 193 lb 12.8 oz (87.9 kg)  ?  ?  ?Physical Exam ?Vitals and nursing note reviewed.  ?Constitutional:   ?   Appearance: Normal appearance. She is obese. She is not ill-appearing.  ?   Comments: Weight loss noted  ?Cardiovascular:  ?   Rate and Rhythm: Normal rate and regular rhythm.  ?   Pulses: Normal pulses.  ?   Heart sounds: Normal heart sounds. No murmur heard. ?Pulmonary:  ?   Effort: Pulmonary effort is normal. No respiratory distress.  ?   Breath sounds: Normal breath sounds. No wheezing, rhonchi or rales.  ?Musculoskeletal:     ?   General: Swelling present.  ?   Right lower leg: Edema present.  ?   Left lower leg: Edema present.  ?   Comments: Unna boot present to RLE  ?Skin: ?   General: Skin is warm and dry.  ?   Findings: No erythema or rash.  ?Neurological:  ?   Mental Status: She is alert.  ? ?   ?Results for orders placed or performed in visit on 02/14/22  ?Basic metabolic panel  ?Result Value Ref Range  ? Sodium 141 135 - 145 mEq/L  ? Potassium 4.3 3.5 - 5.1 mEq/L  ? Chloride 105 96 - 112 mEq/L  ? CO2 31 19 - 32 mEq/L  ? Glucose, Bld 92 70 - 99 mg/dL  ? BUN 24 (H) 6 - 23 mg/dL  ? Creatinine, Ser 0.73 0.40 - 1.20 mg/dL  ? GFR 80.34 >60.00 mL/min  ? Calcium 9.7 8.4 - 10.5 mg/dL  ?CBC with Differential/Platelet  ?Result Value Ref Range  ? WBC 5.9 4.0 - 10.5 K/uL  ? RBC 4.22 3.87 - 5.11 Mil/uL  ? Hemoglobin 12.1 12.0 - 15.0 g/dL  ? HCT 37.4 36.0 - 46.0 %  ? MCV 88.6 78.0 - 100.0 fl  ? MCHC 32.3 30.0 - 36.0 g/dL  ? RDW 15.9 (H) 11.5 - 15.5 %  ? Platelets 218.0 150.0 - 400.0 K/uL  ? Neutrophils Relative % 59.7 43.0 - 77.0 %  ? Lymphocytes Relative 28.4 12.0 - 46.0 %  ? Monocytes Relative 7.5 3.0  - 12.0 %  ? Eosinophils Relative 3.9 0.0 - 5.0 %  ? Basophils Relative 0.5 0.0 - 3.0 %  ? Neutro Abs 3.5 1.4 - 7.7 K/uL  ? Lymphs Abs 1.7 0.7 - 4.0 K/uL  ? Monocytes Absolute 0.4 0.1 - 1.0 K/uL  ? Eosinophils Absolute  0.2 0.0 - 0.7 K/uL  ? Basophils Absolute 0.0 0.0 - 0.1 K/uL  ? ?*Note: Due to a large number of results and/or encounters for the requested time period, some results have not been displayed. A complete set of results can be found in Results Review.  ? ? ?Assessment & Plan:  ? ?Problem List Items Addressed This Visit   ? ? Hypertension - Primary  ? Relevant Orders  ? Basic metabolic panel (Completed)  ? CBC with Differential/Platelet (Completed)  ? Chronic venous insufficiency  ? Varicose veins of both lower extremities with complications  ? Bilateral lower extremity edema  ?  Continues lasix '40mg'$  daily with 2nd dose PRN, also taking potassium supplementation 17mq BID regularly.  ? ?  ?  ? 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  Saw neurology Dr CBilley Gosling pending neurocognitive evaluation.  ?Appreciate neurology evaluation.  ? ?  ?  ? Venous stasis ulcer of right lower leg with edema of right lower leg (HCC)  ?  Continue regular wound clinic care.  ? ?  ?  ? Cellulitis of left lower extremity  ?  S/p rocephin --> oral keflex treatment, cellulitis has largely resolved.  ? ?  ?  ? Mild cognitive impairment  ? Hypokalemia  ?  Severe, likely related to lasix use , s/p repletion in hospital. Update BMP today.  ? ?  ?  ?  ? ?No orders of the defined types were placed in this encounter. ? ?Orders Placed This Encounter  ?Procedures  ? Basic metabolic panel  ? CBC with Differential/Platelet  ? ? ? ?Patient Instructions  ?Labs today  ?I'm glad you're doing better.  ?Return as needed or in 3 months for follow up visit.  ? ?Follow up plan: ?Return in about 3 months (around 05/16/2022) for follow up visit. ? ?JRia Bush MD   ?

## 2022-02-15 ENCOUNTER — Encounter: Payer: Medicare Other | Admitting: Physician Assistant

## 2022-02-15 DIAGNOSIS — I890 Lymphedema, not elsewhere classified: Secondary | ICD-10-CM | POA: Diagnosis not present

## 2022-02-15 DIAGNOSIS — L97212 Non-pressure chronic ulcer of right calf with fat layer exposed: Secondary | ICD-10-CM | POA: Diagnosis not present

## 2022-02-15 DIAGNOSIS — I87331 Chronic venous hypertension (idiopathic) with ulcer and inflammation of right lower extremity: Secondary | ICD-10-CM | POA: Diagnosis not present

## 2022-02-15 DIAGNOSIS — L97812 Non-pressure chronic ulcer of other part of right lower leg with fat layer exposed: Secondary | ICD-10-CM | POA: Diagnosis not present

## 2022-02-15 NOTE — Progress Notes (Addendum)
NURAH, PETRIDES ([id number]) ?Visit Report for 02/15/2022 ?Chief Complaint Document Details ?Patient Name: VALERYE, KOBUS ?Date of Service: 02/15/2022 2:30 PM ?Medical Record Number: [id number] ?Patient Account Number: [id number] ?Date of Birth/Sex: [birth year] (76 y.o. F) ?Treating RN: Donnamarie Poag ?Primary Care Provider: [name] Other Clinician: ?Referring Provider: [name] ?Treating Provider/Extender: Jeri Cos ?Weeks in Treatment: 11 ?Information Obtained from: Patient ?Chief Complaint ?Right LE Ulcer ?Electronic Signature(s) ?Signed: 02/15/2022 3:00:21 PM By: Worthy Keeler PA-C ?Entered By: Worthy Keeler on 02/15/2022 15:00:21 ?LAKYRA, TIPPINS ([id number]) ?-------------------------------------------------------------------------------- ?HPI Details ?Patient Name: AMEISHA, MCCLELLAN ?Date of Service: 02/15/2022 2:30 PM ?Medical Record Number: [id number] ?Patient Account Number: [id number] ?Date of Birth/Sex: [birth year] (76 y.o. F) ?Treating RN: Donnamarie Poag ?Primary Care Provider: [name] Other Clinician: ?Referring Provider: [name] ?Treating Provider/Extender: Jeri Cos ?Weeks in Treatment: 11 ?History of Present Illness ?HPI Description: Pleasant 76 year old with history of chronic venous insufficiency. No diabetes or peripheral vascular disease. Left ABI 1.29. ?Questionable history of left lower extremity DVT. ?She developed a recurrent ulceration on her left lateral calf in December 2015, which she attributes to poor diet and subsequent lower extremity ?edema. She underwent endovenous laser ablation of her left greater saphenous vein in 2010. She underwent laser ablation of accessory branch of ?left GSV in April 2016 by Dr. Kellie Simmering at [facility]. She was previously wearing Unna boots, which she tolerated well. ?Tolerating 2 layer compression and cadexomer iodine. ?She returns to clinic for follow-up and is without new complaints. She denies any significant pain at this time.  She reports persistent pain with ?pressure. No claudication or ischemic rest pain. No fever or chills. No drainage. ?READMISSION ?11/13/16; this is a 76 year old woman who is not a diabetic. She is here for a review of a painful area on her left medial lower extremity. I note that ?she was seen here previously last year for wound I believe to be in the same area. At that time she had undergone previously a left greater ?saphenous vein ablation by Dr. Kellie Simmering and she had a ablation of the anterior accessory branch of the left greater saphenous vein in March 2016. ?Seeing that the wound actually closed over. In reviewing the history with her today the ulcer in this area has been recurrent. She describes a ?biopsy of this area in 2009 that only showed stasis physiology. She also has a history of today malignant melanoma in the right shoulder for which ?she follows with Dr. 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 Do not see that she has ever had arterial studies in the left leg. She tells me she has a follow-up with Dr. Kellie Simmering in roughly 10 days ?In any case she developed the reopening of this area roughly a month ago. On the background of this she describes rapidly increasing edema ?which has responded to Lasix 40 mg and metolazone 2.5 mg as well as the patient's lymph massage. She has been told she has both venous ?insufficiency and lymphedema but she cannot tolerate compression stockings ?11/28/16; the patient saw Dr. Kellie Simmering recently. Per the patient he did arterial Dopplers in the office that did not show evidence of arterial ?insufficiency, per the patient he stated "treat this like an ordinary venous ulcer". She also saw her dermatologist Dr. Ronnald Ramp who felt that this was ?more of a vascular ulcer. In general things are improving although she  arrives today with increasing bilateral lower extremity  edema with weeping ?a deeper fluid through the wound on the left medial leg compatible with some degree of lymphedema ?12/04/16; the patient's wound is fully epithelialized but I don't think fully healed. We will do another week of depression with Promogran and TCA ?however I suspect we'll be able to discharge her next week. This is a very unusual-looking wound which was initially a figure-of-eight type wound ?lying on its side surrounded by petechial like hemorrhage. She has had venous ablation on this side. She apparently does not have an arterial ?issue per Dr. Kellie Simmering. She saw her dermatologist thought it was "vascular". Patient is definitely going to need ongoing compression and I talked ?about this with her today she will go to elastic therapy after she leaves here next week ?12/11/16; the patient's wound is not completely closed today. She has surrounding scar tissue and in further discussion with the patient it would ?appear that she had ulcers in this area in 2009 for a prolonged period of time ultimately requiring a punch biopsy of this area that only showed ?venous insufficiency. I did not previously pickup on this part of the history from the patient. ?12/18/16; the patient's wound is completely epithelialized. There is no open area here. She has significant bilateral venous insufficiency with ?secondary lymphedema to a mild-to-moderate degree she does not have compression stockings.. She did not say anything to me when I was in ?the room, she told our intake nurse that she was still having pain in this area. This isn't unusual recurrent small open area. She is going to go to ?elastic therapy to obtain compression stockings. ?12/25/16; the patient's wound is fully epithelialized. There is no open area here. The patient describes some continued episodic discomfort in this ?area medial left calf. However everything looks fine and healed here. She is been to elastic therapy and caught herself 15-20 mmHg  stockings, ?they apparently were having trouble getting 20-30 mm stockings in her size ?01/22/17; this is a patient we discharged from the clinic a month ago. She has a recurrent open wound on her medial left calf. She had 15 mm ?support stockings. I told her I thought she needed 20-30 mm compression stockings. She tells me that she has been ill with hospitalization ?secondary to asthma and is been found to have severe hypokalemia likely secondary to a combination of Lasix and metolazone. This morning she ?noted blistering and leaking fluid on the posterior part of her left leg. She called our intake nurse urgently and we was saw her this afternoon. She ?has not had any real discomfort here. I don't know that she's been wearing any stockings on this leg for at least 2-3 days. ABIs in this clinic were ?1.21 on the right and 1.3 on the left. She is previously seen vascular surgery who does not think that there is a peripheral arterial issue. ?01/30/17; Patient arrives with no open wound on the left leg. She has been to elastic therapy and obtained 20-76mhg below knee stockings and ?she has one on the right leg today. ?READMISSION ?02/19/18; this PKikeris a now 76year old patient we've had in this clinic perhaps 3 times before. I had last looked at her from January 2 February ?2018 with an area on the medial left leg. We discharged her on 12/25/16 however she had to be readmitted on 01/22/17 with a recurrence. I have ?in my notes that we discharged her on 20-30 mm stockings although she tells me  she was only wearing support hose because she cannot get ?stockings on predominantly related to her cervical spine surgery/issues. She has had previous ablations done by vein and vascular in [place] ?including a great saphenous vein ablation on the left with an anterior accessory branch ablation I think both of these were in 2016. On one of the ?previous visit she had a biopsy noted 2009 that was negative. She is not felt to  have an arterial issue. She is not a diabetic. She does have a ?history of obstructive sleep apnea hypertension asthma as well as chronic venous insufficiency and lymphedema. ?On this occasion she noted 2 dry s

## 2022-02-15 NOTE — Progress Notes (Signed)
SKILER, TYE ([id number]) ?Visit Report for 02/15/2022 ?Arrival Information Details ?Patient Name: [name] ?Date of Service: 02/15/2022 2:30 PM ?Medical Record Number: [id number] ?Patient Account Number: [id number] ?Date of Birth/Sex: May 29, 1946 (76 y.o. F) ?Treating RN: Donnamarie Poag ?Primary Care Christiane Sistare: [name] Other Clinician: ?Referring Serina Nichter: [name] ?Treating Kinzlie Harney/Extender: Jeri Cos ?Weeks in Treatment: 11 ?Visit Information History Since Last Visit ?Added or deleted any medications: No ?Patient Arrived: Ambulatory ?Had a fall or experienced change in No ?Arrival Time: 14:43 ?activities of daily living that may affect ?Accompanied By: self ?risk of falls: ?Transfer Assistance: None ?Hospitalized since last visit: No ?Patient Identification Verified: Yes ?Has Dressing in Place as Prescribed: Yes ?Secondary Verification Process Completed: Yes ?Has Compression in Place as Prescribed: Yes ?Patient Requires Transmission-Based No ?Pain Present Now: No ?Precautions: ?Patient Has Alerts: Yes ?Patient Alerts: Patient on Blood ?Thinner ?85m aspirin ?[place] vascular ABI ?Electronic Signature(s) ?Signed: 02/15/2022 4:01:40 PM By: BDonnamarie Poag?Entered By:Donnamarie Poagon 02/15/2022 14:55:43 ?PKASIDY, GIANINO([id number] ?-------------------------------------------------------------------------------- ?Compression Therapy Details ?Patient Name: [name]?Date of Service: 02/15/2022 2:30 PM ?Medical Record Number: [id number]?Patient Account Number: [id number]?Date of Birth/Sex: 9Dec 15, 1947((76y.o. F) ?Treating RN: BDonnamarie Poag?Primary Care Luvena Wentling: GRia BushOther Clinician: ?Referring Javeon Macmurray: GRia Bush?Treating Javeah Loeza/Extender: SJeri Cos?Weeks in Treatment: 11 ?Compression Therapy Performed for Wound Assessment: Wound #15 Right,Posterior Lower Leg ?Performed By: Clinician BDonnamarie Poag RN ?Compression Type: Three Layer ?Post Procedure Diagnosis ?Same as  Pre-procedure ?Electronic Signature(s) ?Signed: 02/15/2022 4:01:40 PM By: BDonnamarie Poag?Entered By:Donnamarie Poagon 02/15/2022 15:05:28 ?PVERNETA, HAMIDI([id number] ?-------------------------------------------------------------------------------- ?Encounter Discharge Information Details ?Patient Name: [name]?Date of Service: 02/15/2022 2:30 PM ?Medical Record Number: [id number]?Patient Account Number: [id number]?Date of Birth/Sex: 905/13/1947((76y.o. F) ?Treating RN: BDonnamarie Poag?Primary Care Bensyn Bornemann: GRia BushOther Clinician: ?Referring Dorthea Maina: GRia Bush?Treating Sudais Banghart/Extender: SJeri Cos?Weeks in Treatment: 11 ?Encounter Discharge Information Items ?Discharge Condition: Stable ?Ambulatory Status: Ambulatory ?Discharge Destination: Home ?Transportation: Private Auto ?Accompanied By: self ?Schedule Follow-up Appointment: Yes ?Clinical Summary of Care: ?Electronic Signature(s) ?Signed: 02/15/2022 4:01:40 PM By: BDonnamarie Poag?Entered By:Donnamarie Poagon 02/15/2022 15:08:43 ?PNETANYA, YAZDANI([id number] ?-------------------------------------------------------------------------------- ?Lower Extremity Assessment Details ?Patient Name: [name]?Date of Service: 02/15/2022 2:30 PM ?Medical Record Number: [id number]?Patient Account Number: [id number]?Date of Birth/Sex: [birth year]((76y.o. F) ?Treating RN: BDonnamarie Poag?Primary Care Jaylnn Ullery: GRia BushOther Clinician: ?Referring Quentina Fronek: GRia Bush?Treating Carnella Fryman/Extender: SJeri Cos?Weeks in Treatment: 11 ?Edema Assessment ?Assessed: [Left: No] [Right: Yes] ?Edema: [Left: Ye] [Right: s] ?Calf ?Left: Right: ?Point of Measurement: 30 cm From Medial Instep 46 cm ?Ankle ?Left: Right: ?Point of Measurement: 13 cm From Medial Instep 24 cm ?Electronic Signature(s) ?Signed: 02/15/2022 4:01:40 PM By: BDonnamarie Poag?Entered By:Donnamarie Poagon 02/15/2022 14:59:55 ?PLUCY, BOARDMAN ([id number] ?-------------------------------------------------------------------------------- ?Multi Wound Chart Details ?Patient Name: [name]?Date of Service: 02/15/2022 2:30 PM ?Medical Record Number: [id number]?Patient Account Number: [id number]?Date of Birth/Sex: [birth year]((76y.o. F) ?Treating RN: BDonnamarie Poag?Primary Care Ikenna Ohms: GRia BushOther Clinician: ?Referring Flossie [name]: GRia Bush?Treating Taunya Goral/Extender: SJeri Cos?Weeks in Treatment: 11 ?Vital Signs ?Height(in): 62 ?Pulse(bpm): 82 ?Weight(lbs): 199 ?Blood Pressure(mmHg): 139/69 ?Body Mass Index(BMI): 36.4 ?Temperature(??F): 98.5 ?Respiratory Rate(breaths/min): 16 ?Photos: [N/A:N/A] ?Wound Location: Right, Posterior Lower Leg Right, Medial Lower Leg N/A ?Wounding Event: Gradually Appeared Gradually Appeared N/A ?Primary Etiology: Lymphedema Lymphedema N/A ?Secondary Etiology: Venous Leg Ulcer Venous Leg Ulcer N/A ?Comorbid History: Cataracts,  Asthma, Sleep Apnea, Cataracts, Asthma, Sleep Apnea, N/A ?Deep Vein Thrombosis, Deep Vein Thrombosis, ?Hypertension, Peripheral Venous Hypertension, Peripheral Venous ?Disease, Osteoarthritis, Received Disease, Osteoarthritis, Received ?Chemotherapy, Received Radiation Chemotherapy, Received Radiation ?Date Acquired: 10/27/2021 10/26/2021 N/A ?Weeks of Treatment: 11 7 N/A ?Wound Status: Open Open N/A ?Wound Recurrence: No No N/A ?Measurements L x W x D (cm) 5x6x31 1x0.7x0.1 N/A ?Area (cm?) : 23.562 0.55 N/A ?Volume (cm?) : 730.42 0.055 N/A ?% Reduction in Area: 74.80% 66.60% N/A ?% Reduction in Volume: -7715.30% 66.70% N/A ?Classification: Full Thickness Without Exposed Full Thickness Without Exposed N/A ?Support Structures Support Structures ?Exudate Amount: Large Medium N/A ?Exudate Type: Serosanguineous Serosanguineous N/A ?Exudate Color: red, brown red, brown N/A ?Wound Margin: Indistinct, nonvisible N/A N/A ?Granulation Amount: Medium (34-66%) Medium (34-66%) N/A ?Granulation  Quality: Pink, Pale Pink, Pale N/A ?Necrotic Amount: Medium (34-66%) Medium (34-66%) N/A ?Exposed Structures: ?Fat Layer (Subcutaneous Tissue): ?Fat Layer (Subcutaneous Tissue): N/A ?Yes Yes ?Fascia: No ?Tendon: No ?Muscle: No ?Joint: No ?Bone: No ?Epithelialization: Small (1-33%) None N/A ?Treatment Notes ?Electronic Signature(s) ?Signed: 02/15/2022 4:01:40 PM By: Donnamarie Poag ?ROSANNA, BICKLE ([id number]) ?Entered ByDonnamarie Poag on 02/15/2022 15:04:45 ?JAXSON, KEENER ([id number]) ?-------------------------------------------------------------------------------- ?Multi-Disciplinary Care Plan Details ?Patient Name: CHARLOTTE, FIDALGO ?Date of Service: 02/15/2022 2:30 PM ?Medical Record Number: [id number] ?Patient Account Number: [id number] ?Date of Birth/Sex: [birth year] (76 y.o. F) ?Treating RN: Donnamarie Poag ?Primary Care Sari [name]: [name] Other Clinician: ?Referring Elmire Amrein: [name] ?Treating Midas Daughety/Extender: Jeri Cos ?Weeks in Treatment: 11 ?Active Inactive ?Necrotic Tissue ?Nursing Diagnoses: ?Impaired tissue integrity related to necrotic/devitalized tissue ?Knowledge deficit related to management of necrotic/devitalized tissue ?Goals: ?Necrotic/devitalized tissue will be minimized in the wound bed ?Date Initiated: 11/27/2021 ?Target Resolution Date: 12/25/2021 ?Goal Status: Active ?Patient/caregiver will verbalize understanding of reason and process for debridement of necrotic tissue ?Date Initiated: 11/27/2021 ?Date Inactivated: 02/15/2022 ?Target Resolution Date: 12/25/2021 ?Goal Status: Met ?Interventions: ?Assess patient pain level pre-, during and post procedure and prior to discharge ?Provide education on necrotic tissue and debridement process ?Notes: ?Soft Tissue Infection ?Nursing Diagnoses: ?Impaired tissue integrity ?Knowledge deficit related to disease process and management ?Knowledge deficit related to home infection control: handwashing, handling of soiled dressings, supply  storage ?Potential for infection: soft tissue ?Goals: ?Patient will remain free of wound infection ?Date Initiated: 11/27/2021 ?Target Resolution Date: 12/25/2021 ?Goal Status: Active ?Patient/caregiver will verbalize understanding of or measures to prevent infection an

## 2022-02-17 ENCOUNTER — Other Ambulatory Visit: Payer: Self-pay | Admitting: Family Medicine

## 2022-02-17 MED ORDER — FUROSEMIDE 40 MG PO TABS: 40.0000 mg | ORAL_TABLET | Freq: Every day | ORAL | 2 refills | Status: DC

## 2022-02-17 NOTE — Assessment & Plan Note (Signed)
Continue regular wound clinic care.  ?

## 2022-02-17 NOTE — Assessment & Plan Note (Signed)
Severe, likely related to lasix use , s/p repletion in hospital. Update BMP today.  ?

## 2022-02-17 NOTE — Assessment & Plan Note (Signed)
Continues lasix '40mg'$  daily with 2nd dose PRN, also taking potassium supplementation 82mq BID regularly.  ?

## 2022-02-17 NOTE — Assessment & Plan Note (Addendum)
Saw neurology Dr Billey Gosling, pending neurocognitive evaluation.  ?Appreciate neurology evaluation.  ?

## 2022-02-17 NOTE — Assessment & Plan Note (Signed)
S/p rocephin --> oral keflex treatment, cellulitis has largely resolved.  ?

## 2022-02-19 ENCOUNTER — Telehealth: Payer: Self-pay | Admitting: Family Medicine

## 2022-02-19 NOTE — Telephone Encounter (Signed)
Pt returning your call

## 2022-02-19 NOTE — Telephone Encounter (Signed)
Lvm asking pt to call back.  Need to relay lab results and Dr. Synthia Innocent message (see Lab- 02/14/22, Result Notes) ?

## 2022-02-20 NOTE — Telephone Encounter (Signed)
Rx called to pharmacy

## 2022-02-20 NOTE — Telephone Encounter (Signed)
Lvm asking pt to call back.  Need to relay lab results and Dr. Synthia Innocent message (see Lab- 02/14/22, Result Notes) ?

## 2022-02-22 ENCOUNTER — Ambulatory Visit: Payer: Medicare Other | Admitting: Physician Assistant

## 2022-02-22 DIAGNOSIS — R413 Other amnesia: Secondary | ICD-10-CM | POA: Diagnosis not present

## 2022-02-23 ENCOUNTER — Encounter: Payer: Medicare Other | Admitting: Physician Assistant

## 2022-02-23 DIAGNOSIS — L97812 Non-pressure chronic ulcer of other part of right lower leg with fat layer exposed: Secondary | ICD-10-CM | POA: Diagnosis not present

## 2022-02-23 DIAGNOSIS — I890 Lymphedema, not elsewhere classified: Secondary | ICD-10-CM | POA: Diagnosis not present

## 2022-02-23 DIAGNOSIS — I87331 Chronic venous hypertension (idiopathic) with ulcer and inflammation of right lower extremity: Secondary | ICD-10-CM | POA: Diagnosis not present

## 2022-02-23 DIAGNOSIS — L97212 Non-pressure chronic ulcer of right calf with fat layer exposed: Secondary | ICD-10-CM | POA: Diagnosis not present

## 2022-02-23 NOTE — Progress Notes (Signed)
LATISE, DILLEY ([id number]) ?Visit Report for 02/23/2022 ?Arrival Information Details ?Patient Name: [name], [name] ?Date of Service: 02/23/2022 2:00 PM ?Medical Record Number: [id number] ?Patient Account Number: [id number] ?Date of Birth/Sex: 15-Apr-1946 (76 y.o. F) ?Treating RN: Carlene Coria ?Primary Care Jahara Dail: [name] Other Clinician: ?Referring Rufus Cypert: [name] ?Treating Benjamim Harnish/Extender: Jeri Cos ?Weeks in Treatment: 12 ?Visit Information History Since Last Visit ?All ordered tests and consults were completed: No ?Patient Arrived: Ambulatory ?Added or deleted any medications: No ?Arrival Time: 13:57 ?Any new allergies or adverse reactions: No ?Accompanied By: self ?Had a fall or experienced change in No ?Transfer Assistance: None ?activities of daily living that may affect ?Patient Identification Verified: Yes ?risk of falls: ?Secondary Verification Process Completed: Yes ?Signs or symptoms of abuse/neglect since last visito No ?Patient Requires Transmission-Based No ?Hospitalized since last visit: No ?Precautions: ?Implantable device outside of the clinic excluding No ?Patient Has Alerts: Yes ?cellular tissue based products placed in the center ?Patient Alerts: Patient on Blood ?since last visit: ?Thinner ?Has Dressing in Place as Prescribed: Yes ?'81mg'$  aspirin ?Has Compression in Place as Prescribed: Yes ?[place] [facility] ABI ?Pain Present Now: No ?Electronic Signature(s) ?Signed: 02/23/2022 4:04:50 PM By: Carlene Coria RN ?Entered By: Carlene Coria on 02/23/2022 14:02:49 ?[name], [name] ([id number]) ?-------------------------------------------------------------------------------- ?Clinic Level of Care Assessment Details ?Patient Name: [name], [name] ?Date of Service: 02/23/2022 2:00 PM ?Medical Record Number: [id number] ?Patient Account Number: [id number] ?Date of Birth/Sex: 25-Dec-1945 (76 y.o. F) ?Treating RN: Carlene Coria ?Primary Care Rochelle Nephew: [name] Other  Clinician: ?Referring Karesha Trzcinski: [name] ?Treating Ashlen Kiger/Extender: Jeri Cos ?Weeks in Treatment: 12 ?Clinic Level of Care Assessment Items ?TOOL 1 Quantity Score ?'[]'$  - Use when EandM and Procedure is performed on INITIAL visit 0 ?ASSESSMENTS - Nursing Assessment / Reassessment ?'[]'$  - General Physical Exam (combine w/ comprehensive assessment (listed just below) when performed on new ?0 ?pt. evals) ?'[]'$  - 0 ?Comprehensive Assessment (HX, ROS, Risk Assessments, Wounds Hx, etc.) ?ASSESSMENTS - Wound and Skin Assessment / Reassessment ?'[]'$  - Dermatologic / Skin Assessment (not related to wound area) 0 ?ASSESSMENTS - Ostomy and/or Continence Assessment and Care ?'[]'$  - Incontinence Assessment and Management 0 ?'[]'$  - 0 ?Ostomy Care Assessment and Management (repouching, etc.) ?PROCESS - Coordination of Care ?'[]'$  - Simple Patient / Family Education for ongoing care 0 ?'[]'$  - 0 ?Complex (extensive) Patient / Family Education for ongoing care ?'[]'$  - 0 ?Staff obtains Consents, Records, Test Results / Process Orders ?'[]'$  - 0 ?Staff telephones HHA, Nursing Homes / Clarify orders / etc ?'[]'$  - 0 ?Routine Transfer to another Facility (non-emergent condition) ?'[]'$  - 0 ?Routine Hospital Admission (non-emergent condition) ?'[]'$  - 0 ?New Admissions / Biomedical engineer / Ordering NPWT, Apligraf, etc. ?'[]'$  - 0 ?Emergency Hospital Admission (emergent condition) ?PROCESS - Special Needs ?'[]'$  - Pediatric / Minor Patient Management 0 ?'[]'$  - 0 ?Isolation Patient Management ?'[]'$  - 0 ?Hearing / Language / Visual special needs ?'[]'$  - 0 ?Assessment of Community assistance (transportation, D/C planning, etc.) ?'[]'$  - 0 ?Additional assistance / Altered mentation ?'[]'$  - 0 ?Support Surface(s) Assessment (bed, cushion, seat, etc.) ?INTERVENTIONS - Miscellaneous ?'[]'$  - External ear exam 0 ?'[]'$  - 0 ?Patient Transfer (multiple staff / Civil Service fast streamer / Similar devices) ?'[]'$  - 0 ?Simple Staple / Suture removal (25 or less) ?'[]'$  - 0 ?Complex Staple / Suture removal  (26 or more) ?'[]'$  - 0 ?Hypo/Hyperglycemic Management (do not check if billed separately) ?'[]'$  - 0 ?Ankle / Brachial Index (ABI) - do not check if billed separately ?  Has the patient been seen at the hospital within the last three years: Yes ?Total Score: 0 ?Level Of Care: ____ ?[name], [name] ([id number]) ?Electronic Signature(s) ?Signed: 02/23/2022 4:04:50 PM By: Carlene Coria RN ?Entered By: Carlene Coria on 02/23/2022 14:23:08 ?[name], [name] ([id number]) ?-------------------------------------------------------------------------------- ?Compression Therapy Details ?Patient Name: [name], [name] ?Date of Service: 02/23/2022 2:00 PM ?Medical Record Number: [id number] ?Patient Account Number: [id number] ?Date of Birth/Sex: 1946-03-19 (76 y.o. F) ?Treating RN: Carlene Coria ?Primary Care Breean Nannini: [name] Other Clinician: ?Referring Shaquill Iseman: [name] ?Treating Orin Eberwein/Extender: Jeri Cos ?Weeks in Treatment: 12 ?Compression Therapy Performed for Wound Assessment: Wound #15 Right,Posterior Lower Leg ?Performed By: Clinician Carlene Coria, RN ?Compression Type: Three Layer ?Post Procedure Diagnosis ?Same as Pre-procedure ?Electronic Signature(s) ?Signed: 02/23/2022 4:04:50 PM By: Carlene Coria RN ?Entered By: Carlene Coria on 02/23/2022 14:20:18 ?[name], [name] ([id number]) ?-------------------------------------------------------------------------------- ?Compression Therapy Details ?Patient Name: [name], [name] ?Date of Service: 02/23/2022 2:00 PM ?Medical Record Number: [id number] ?Patient Account Number: [id number] ?Date of Birth/Sex: 03-06-1946 (76 y.o. F) ?Treating RN: Carlene Coria ?Primary Care Omunique Pederson: [name] Other Clinician: ?Referring Tiffinie Caillier: [name] ?Treating Deiondre Harrower/Extender: Jeri Cos ?Weeks in Treatment: 12 ?Compression Therapy Performed for Wound Assessment: Wound #16 Right,Medial Lower Leg ?Performed By: Clinician Carlene Coria, RN ?Compression Type: Three Layer ?Post  Procedure Diagnosis ?Same as Pre-procedure ?Electronic Signature(s) ?Signed: 02/23/2022 4:04:50 PM By: Carlene Coria RN ?Entered By: Carlene Coria on 02/23/2022 14:20:51 ?KAIRI, [name] ([id number]) ?-------------------------------------------------------------------------------- ?Encounter Discharge Information Details ?Patient Name: [name], [name] ?Date of Service: 02/23/2022 2:00 PM ?Medical Record Number: [id number] ?Patient Account Number: [id number] ?Date of Birth/Sex: 04-16-1946 (76 y.o. F) ?Treating RN: Carlene Coria ?Primary Care Cadin Luka: [name] Other Clinician: ?Referring Wetzel Meester: [name] ?Treating Dewitte Vannice/Extender: Jeri Cos ?Weeks in Treatment: 12 ?Encounter Discharge Information Items ?Discharge Condition: Stable ?Ambulatory Status: Ambulatory ?Discharge Destination: Home ?Transportation: Private Auto ?Accompanied By: self ?Schedule Follow-up Appointment: Yes ?Clinical Summary of Care: Patient Declined ?Electronic Signature(s) ?Signed: 02/23/2022 4:04:50 PM By: Carlene Coria RN ?Entered By: Carlene Coria on 02/23/2022 14:24:49 ?[name], [name] ([id number]) ?-------------------------------------------------------------------------------- ?Lower Extremity Assessment Details ?Patient Name: [name], [name] ?Date of Service: 02/23/2022 2:00 PM ?Medical Record Number: [id number] ?Patient Account Number: [id number] ?Date of Birth/Sex: 20-Oct-1946 (76 y.o. F) ?Treating RN: Carlene Coria ?Primary Care Bryler Dibble: [name] Other Clinician: ?Referring Adisen Bennion: [name] ?Treating Manreet Kiernan/Extender: Jeri Cos ?Weeks in Treatment: 12 ?Edema Assessment ?Assessed: [Left: No] [Right: No] ?Edema: [Left: Ye] [Right: s] ?Calf ?Left: Right: ?Point of Measurement: 30 cm From Medial Instep 44 cm ?Ankle ?Left: Right: ?Point of Measurement: 13 cm From Medial Instep 22 cm ?Vascular Assessment ?Pulses: ?Dorsalis Pedis ?Palpable: [Right:Yes] ?Electronic Signature(s) ?Signed: 02/23/2022 4:04:50 PM By:  Carlene Coria RN ?Entered By: Carlene Coria on 02/23/2022 14:15:23 ?[name], [name] ([id number]) ?-------------------------------------------------------------------------------- ?Multi Wound Chart Details ?Patient Name: PET

## 2022-02-23 NOTE — Progress Notes (Addendum)
[name] ([id number]) ?Visit Report for 02/23/2022 ?Chief Complaint Document Details ?Patient Name: [name], [name] ?Date of Service: 02/23/2022 2:00 PM ?Medical Record Number: [id number] ?Patient Account Number: [id number] ?Date of Birth/Sex: 1946-01-27 (76 y.o. F) ?Treating RN: Carlene Coria ?Primary Care Provider: [name] Other Clinician: ?Referring Provider: [name] ?Treating Provider/Extender: Jeri Cos ?Weeks in Treatment: 12 ?Information Obtained from: Patient ?Chief Complaint ?Right LE Ulcer ?Electronic Signature(s) ?Signed: 02/23/2022 2:16:19 PM By: Worthy Keeler PA-C ?Entered By: Worthy Keeler on 02/23/2022 14:16:19 ?[name], [name] ([id number]) ?-------------------------------------------------------------------------------- ?HPI Details ?Patient Name: [name] ?Date of Service: 02/23/2022 2:00 PM ?Medical Record Number: [id number] ?Patient Account Number: [id number] ?Date of Birth/Sex: 12-04-1945 (76 y.o. F) ?Treating RN: Carlene Coria ?Primary Care Provider: [name] Other Clinician: ?Referring Provider: [name] ?Treating Provider/Extender: Jeri Cos ?Weeks in Treatment: 12 ?History of Present Illness ?HPI Description: Pleasant 75 year old with history of chronic venous insufficiency. No diabetes or peripheral vascular disease. Left ABI 1.29. ?Questionable history of left lower extremity DVT. ?She developed a recurrent ulceration on her left lateral calf in December 2015, which she attributes to poor diet and subsequent lower extremity ?edema. She underwent endovenous laser ablation of her left greater saphenous vein in 2010. She underwent laser ablation of accessory branch of ?left GSV in April 2016 by Dr. Kellie Simmering at [facility]. She was previously wearing Unna boots, which she tolerated well. ?Tolerating 2 layer compression and cadexomer iodine. ?She returns to clinic for follow-up and is without new complaints. She denies any significant pain at this  time. She reports persistent pain with ?pressure. No claudication or ischemic rest pain. No fever or chills. No drainage. ?READMISSION ?11/13/16; this is a 76 year old woman who is not a diabetic. She is here for a review of a painful area on her left medial lower extremity. I note that ?she was seen here previously last year for wound I believe to be in the same area. At that time she had undergone previously a left greater ?saphenous vein ablation by Dr. Kellie Simmering and she had a ablation of the anterior accessory branch of the left greater saphenous vein in March 2016. ?Seeing that the wound actually closed over. In reviewing the history with her today the ulcer in this area has been recurrent. She describes a ?biopsy of this area in 2009 that only showed stasis physiology. She also has a history of today malignant melanoma in the right shoulder for which ?she follows with Dr. 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 Do not see that she has ever had arterial studies in the left leg. She tells me she has a follow-up with Dr. Kellie Simmering in roughly 10 days ?In any case she developed the reopening of this area roughly a month ago. On the background of this she describes rapidly increasing edema ?which has responded to Lasix 40 mg and metolazone 2.5 mg as well as the patient's lymph massage. She has been told she has both venous ?insufficiency and lymphedema but she cannot tolerate compression stockings ?11/28/16; the patient saw Dr. Kellie Simmering recently. Per the patient he did arterial Dopplers in the office that did not show evidence of arterial ?insufficiency, per the patient he stated "treat this like an ordinary venous ulcer". She also saw her dermatologist Dr. Ronnald Ramp who felt that this was ?more of a vascular ulcer. In general things are improving although she  arrives today with increasing bilateral lower  extremity edema with weeping ?a deeper fluid through the wound on the left medial leg compatible with some degree of lymphedema ?12/04/16; the patient's wound is fully epithelialized but I don't think fully healed. We will do another week of depression with Promogran and TCA ?however I suspect we'll be able to discharge her next week. This is a very unusual-looking wound which was initially a figure-of-eight type wound ?lying on its side surrounded by petechial like hemorrhage. She has had venous ablation on this side. She apparently does not have an arterial ?issue per Dr. Kellie Simmering. She saw her dermatologist thought it was "vascular". Patient is definitely going to need ongoing compression and I talked ?about this with her today she will go to elastic therapy after she leaves here next week ?12/11/16; the patient's wound is not completely closed today. She has surrounding scar tissue and in further discussion with the patient it would ?appear that she had ulcers in this area in 2009 for a prolonged period of time ultimately requiring a punch biopsy of this area that only showed ?venous insufficiency. I did not previously pickup on this part of the history from the patient. ?12/18/16; the patient's wound is completely epithelialized. There is no open area here. She has significant bilateral venous insufficiency with ?secondary lymphedema to a mild-to-moderate degree she does not have compression stockings.. She did not say anything to me when I was in ?the room, she told our intake nurse that she was still having pain in this area. This isn't unusual recurrent small open area. She is going to go to ?elastic therapy to obtain compression stockings. ?12/25/16; the patient's wound is fully epithelialized. There is no open area here. The patient describes some continued episodic discomfort in this ?area medial left calf. However everything looks fine and healed here. She is been to elastic therapy and caught herself 15-20 mmHg  stockings, ?they apparently were having trouble getting 20-30 mm stockings in her size ?01/22/17; this is a patient we discharged from the clinic a month ago. She has a recurrent open wound on her medial left calf. She had 15 mm ?support stockings. I told her I thought she needed 20-30 mm compression stockings. She tells me that she has been ill with hospitalization ?secondary to asthma and is been found to have severe hypokalemia likely secondary to a combination of Lasix and metolazone. This morning she ?noted blistering and leaking fluid on the posterior part of her left leg. She called our intake nurse urgently and we was saw her this afternoon. She ?has not had any real discomfort here. I don't know that she's been wearing any stockings on this leg for at least 2-3 days. ABIs in this clinic were ?1.21 on the right and 1.3 on the left. She is previously seen vascular surgery who does not think that there is a peripheral arterial issue. ?01/30/17; Patient arrives with no open wound on the left leg. She has been to elastic therapy and obtained 20-78mhg below knee stockings and ?she has one on the right leg today. ?READMISSION ?02/19/18; this [place] a now 76year old patient we've had in this clinic perhaps 3 times before. I had last looked at her from January 2 February ?2018 with an area on the medial left leg. We discharged her on 12/25/16 however she had to be readmitted on 01/22/17 with a recurrence. I have ?in my notes that we discharged her on 20-30 mm stockings although she tells me  she was only wearing support hose because she cannot get ?stockings on predominantly related to her cervical spine surgery/issues. She has had previous ablations done by vein and vascular in [place] ?including a great saphenous vein ablation on the left with an anterior accessory branch ablation I think both of these were in 2016. On one of the ?previous visit she had a biopsy noted 2009 that was negative. She is not felt to  have an arterial issue. She is not a diabetic. She does have a ?history of obstructive sleep apnea hypertension asthma as well as chronic venous insufficiency and lymphedema. ?On this occasion she noted 2 dry

## 2022-03-01 ENCOUNTER — Encounter: Payer: Medicare Other | Admitting: Internal Medicine

## 2022-03-01 DIAGNOSIS — I890 Lymphedema, not elsewhere classified: Secondary | ICD-10-CM | POA: Diagnosis not present

## 2022-03-01 DIAGNOSIS — L97212 Non-pressure chronic ulcer of right calf with fat layer exposed: Secondary | ICD-10-CM | POA: Diagnosis not present

## 2022-03-01 DIAGNOSIS — I87331 Chronic venous hypertension (idiopathic) with ulcer and inflammation of right lower extremity: Secondary | ICD-10-CM | POA: Diagnosis not present

## 2022-03-01 DIAGNOSIS — L97812 Non-pressure chronic ulcer of other part of right lower leg with fat layer exposed: Secondary | ICD-10-CM | POA: Diagnosis not present

## 2022-03-01 NOTE — Progress Notes (Signed)
SHARRAN, CARATACHEA ([id number]) ?Visit Report for 03/01/2022 ?Debridement Details ?Patient Name: [name], [name] ?Date of Service: 03/01/2022 2:15 PM ?Medical Record Number: [id number] ?Patient Account Number: [id number] ?Date of Birth/Sex: 12/21/1945 (76 y.o. F) ?Treating RN: Levora Dredge ?Primary Care Provider: [name] Other Clinician: ?Referring Provider: [name] ?Treating Provider/Extender: [name] Dillon ?Weeks in Treatment: 13 ?Debridement Performed for ?Wound #15 Right,Posterior Lower Leg ?Assessment: ?Performed By: Physician [name] Dillon, MD ?Debridement Type: Debridement ?Severity of Tissue Pre Debridement: Fat layer exposed ?Level of Consciousness (Pre- ?Awake and Alert ?procedure): ?Pre-procedure Verification/Time Out ?Yes - 14:55 ?Taken: ?Total Area Debrided (L x W): 0.4 (cm) x 0.4 (cm) = 0.16 (cm?) ?Tissue and other material ?Viable, Non-Viable, [place], [place], Subcutaneous, [place] ?debrided: ?Level: Skin/Subcutaneous Tissue ?Debridement Description: Excisional ?Instrument: Curette ?Bleeding: Moderate ?Hemostasis Achieved: Pressure ?Response to Treatment: Procedure was tolerated well ?Level of Consciousness (Post- ?Awake and Alert ?procedure): ?Post Debridement Measurements of Total Wound ?Length: (cm) 3 ?Width: (cm) 2 ?Depth: (cm) 0.1 ?Volume: (cm?) 0.471 ?Character of Wound/Ulcer Post Debridement: Stable ?Severity of Tissue Post Debridement: Fat layer exposed ?Post Procedure Diagnosis ?Same as Pre-procedure ?Notes ?clustered wounds to lower left leg ?Electronic Signature(s) ?Signed: 03/01/2022 4:18:52 PM By: Linton Ham MD ?Signed: 03/01/2022 4:44:20 PM By: Levora Dredge ?Entered By: Linton Ham on 03/01/2022 15:16:00 ?AKELIA, HUSTED ([id number]) ?-------------------------------------------------------------------------------- ?HPI Details ?Patient Name: [name] ?Date of Service: 03/01/2022 2:15 PM ?Medical Record Number: [id number] ?Patient Account Number:  [id number] ?Date of Birth/Sex: [birth year] (76 y.o. F) ?Treating RN: Levora Dredge ?Primary Care Provider: [name] Other Clinician: ?Referring Provider: [name] ?Treating Provider/Extender: [name] Dillon ?Weeks in Treatment: 13 ?History of Present Illness ?HPI Description: Pleasant 76 year old with history of chronic venous insufficiency. No diabetes or peripheral vascular disease. Left ABI 1.29. ?Questionable history of left lower extremity DVT. ?She developed a recurrent ulceration on her left lateral calf in December 2015, which she attributes to poor diet and subsequent lower extremity ?edema. She underwent endovenous laser ablation of her left greater saphenous vein in 2010. She underwent laser ablation of accessory branch of ?left GSV in April 2016 by Dr. Kellie Simmering at [facility]. She was previously wearing Unna boots, which she tolerated well. ?Tolerating 2 layer compression and cadexomer iodine. ?She returns to clinic for follow-up and is without new complaints. She denies any significant pain at this time. She reports persistent pain with ?pressure. No claudication or ischemic rest pain. No fever or chills. No drainage. ?READMISSION ?11/13/16; this is a 76 year old woman who is not a diabetic. She is here for a review of a painful area on her left medial lower extremity. I note that ?she was seen here previously last year for wound I believe to be in the same area. At that time she had undergone previously a left greater ?saphenous vein ablation by Dr. Kellie Simmering and she had a ablation of the anterior accessory branch of the left greater saphenous vein in March 2016. ?Seeing that the wound actually closed over. In reviewing the history with her today the ulcer in this area has been recurrent. She describes a ?biopsy of this area in 2009 that only showed stasis physiology. She also has a history of today malignant melanoma in the right shoulder for which ?she follows with Dr. 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 Do not see that she has ever had arterial studies in  the left leg. She tells me she has a follow-up with Dr. Kellie Simmering in roughly 10 days ?In any case she developed the reopening of this area roughly a month ago. On the background of this she describes rapidly increasing edema ?which has responded to Lasix 40 mg and metolazone 2.5 mg as well as the patient's lymph massage. She has been told she has both venous ?insufficiency and lymphedema but she cannot tolerate compression stockings ?11/28/16; the patient saw Dr. Kellie Simmering recently. Per the patient he did arterial Dopplers in the office that did not show evidence of arterial ?insufficiency, per the patient he stated "treat this like an ordinary venous ulcer". She also saw her dermatologist Dr. Ronnald Ramp who felt that this was ?more of a vascular ulcer. In general things are improving although she arrives today with increasing bilateral lower extremity edema with weeping ?a deeper fluid through the wound on the left medial leg compatible with some degree of lymphedema ?12/04/16; the patient's wound is fully epithelialized but I don't think fully healed. We will do another week of depression with Promogran and TCA ?however I suspect we'll be able to discharge her next week. This is a very unusual-looking wound which was initially a figure-of-eight type wound ?lying on its side surrounded by petechial like hemorrhage. She has had venous ablation on this side. She apparently does not have an arterial ?issue per Dr. Kellie Simmering. She saw her dermatologist thought it was "vascular". Patient is definitely going to need ongoing compression and I talked ?about this with her today she will go to elastic therapy after she leaves here next week ?12/11/16; the patient's wound is not completely closed today. She has surrounding scar tissue and in further  discussion with the patient it would ?appear that she had ulcers in this area in 2009 for a prolonged period of time ultimately requiring a punch biopsy of this area that only showed ?venous insufficiency. I did not previously pickup on this part of the history from the patient. ?12/18/16; the patient's wound is completely epithelialized. There is no open area here. She has significant bilateral venous insufficiency with ?secondary lymphedema to a mild-to-moderate degree she does not have compression stockings.. She did not say anything to me when I was in ?the room, she told our intake nurse that she was still having pain in this area. This isn't unusual recurrent small open area. She is going to go to ?elastic therapy to obtain compression stockings. ?12/25/16; the patient's wound is fully epithelialized. There is no open area here. The patient describes some continued episodic discomfort in this ?area medial left calf. However everything looks fine and healed here. She is been to elastic therapy and caught herself 15-20 mmHg stockings, ?they apparently were having trouble getting 20-30 mm stockings in her size ?01/22/17; this is a patient we discharged from the clinic a month ago. She has a recurrent open wound on her medial left calf. She had 15 mm ?support stockings. I told her I thought she needed 20-30 mm compression stockings. She tells me that she has been ill with hospitalization ?secondary to asthma and is been found to have severe hypokalemia likely secondary to a combination of Lasix and metolazone. This morning she ?noted blistering and leaking fluid on the posterior part of her left leg. She called our intake nurse urgently and we was saw her this afternoon. She ?has not had any real discomfort here. I don't know that she's been wearing any stockings on this leg for at  least 2-3 days. ABIs in this clinic were ?1.21 on the right and 1.3 on the left. She is previously seen vascular surgery who does not  think that there is a peripheral arterial issue. ?01/30/17; Patient arrives with no open wound on the left leg. She has been to elastic therapy and obtained 20-73mhg below knee stockings and ?she has one on the

## 2022-03-01 NOTE — Progress Notes (Signed)
NYJAE, HODGE ([id number]) ?Visit Report for 03/01/2022 ?Arrival Information Details ?Patient Name: [name], [name] ?Date of Service: 03/01/2022 2:15 PM ?Medical Record Number: [id number] ?Patient Account Number: [id number] ?Date of Birth/Sex: 1946/03/11 (76 y.o. F) ?Treating RN: Levora Dredge ?Primary Care Wyett Narine: [name] Other Clinician: ?Referring Abass Misener: [name] ?Treating Colm Lyford/Extender: [name] Dillon ?Weeks in Treatment: 13 ?Visit Information History Since Last Visit ?Added or deleted any medications: No ?Patient Arrived: Ambulatory ?Any new allergies or adverse reactions: No ?Arrival Time: 14:28 ?Had a fall or experienced change in No ?Accompanied By: self ?activities of daily living that may affect ?Transfer Assistance: None ?risk of falls: ?Patient Identification Verified: Yes ?Hospitalized since last visit: No ?Secondary Verification Process Completed: Yes ?Has Dressing in Place as Prescribed: Yes ?Patient Requires Transmission-Based No ?Has Compression in Place as Prescribed: Yes ?Precautions: ?Pain Present Now: No ?Patient Has Alerts: Yes ?Patient Alerts: Patient on Blood ?Thinner ?'81mg'$  aspirin ?[place] vascular ABI ?Electronic Signature(s) ?Signed: 03/01/2022 4:44:20 PM By: Levora Dredge ?Entered By: Levora Dredge on 03/01/2022 14:28:24 ?[name], [name] ([id number]) ?-------------------------------------------------------------------------------- ?Clinic Level of Care Assessment Details ?Patient Name: [name], [name] ?Date of Service: 03/01/2022 2:15 PM ?Medical Record Number: [id number] ?Patient Account Number: [id number] ?Date of Birth/Sex: 1946/03/25 (76 y.o. F) ?Treating RN: Levora Dredge ?Primary Care Nataley Bahri: [name] Other Clinician: ?Referring Jeramiah Mccaughey: [name] ?Treating Josee Speece/Extender: [name] Dillon ?Weeks in Treatment: 13 ?Clinic Level of Care Assessment Items ?TOOL 1 Quantity Score ?'[]'$  - Use when EandM and Procedure is performed on  INITIAL visit 0 ?ASSESSMENTS - Nursing Assessment / Reassessment ?'[]'$  - General Physical Exam (combine w/ comprehensive assessment (listed just below) when performed on new ?0 ?pt. evals) ?'[]'$  - 0 ?Comprehensive Assessment (HX, ROS, Risk Assessments, Wounds Hx, etc.) ?ASSESSMENTS - Wound and Skin Assessment / Reassessment ?'[]'$  - Dermatologic / Skin Assessment (not related to wound area) 0 ?ASSESSMENTS - Ostomy and/or Continence Assessment and Care ?'[]'$  - Incontinence Assessment and Management 0 ?'[]'$  - 0 ?Ostomy Care Assessment and Management (repouching, etc.) ?PROCESS - Coordination of Care ?'[]'$  - Simple Patient / Family Education for ongoing care 0 ?'[]'$  - 0 ?Complex (extensive) Patient / Family Education for ongoing care ?'[]'$  - 0 ?Staff obtains Consents, Records, Test Results / Process Orders ?'[]'$  - 0 ?Staff telephones HHA, Nursing Homes / Clarify orders / etc ?'[]'$  - 0 ?Routine Transfer to another Facility (non-emergent condition) ?'[]'$  - 0 ?Routine Hospital Admission (non-emergent condition) ?'[]'$  - 0 ?New Admissions / Biomedical engineer / Ordering NPWT, Apligraf, etc. ?'[]'$  - 0 ?Emergency Hospital Admission (emergent condition) ?PROCESS - Special Needs ?'[]'$  - Pediatric / Minor Patient Management 0 ?'[]'$  - 0 ?Isolation Patient Management ?'[]'$  - 0 ?Hearing / Language / Visual special needs ?'[]'$  - 0 ?Assessment of Community assistance (transportation, D/C planning, etc.) ?'[]'$  - 0 ?Additional assistance / Altered mentation ?'[]'$  - 0 ?Support Surface(s) Assessment (bed, cushion, seat, etc.) ?INTERVENTIONS - Miscellaneous ?'[]'$  - External ear exam 0 ?'[]'$  - 0 ?Patient Transfer (multiple staff / Civil Service fast streamer / Similar devices) ?'[]'$  - 0 ?Simple Staple / Suture removal (25 or less) ?'[]'$  - 0 ?Complex Staple / Suture removal (26 or more) ?'[]'$  - 0 ?Hypo/Hyperglycemic Management (do not check if billed separately) ?'[]'$  - 0 ?Ankle / Brachial Index (ABI) - do not check if billed separately ?Has the patient been seen at the hospital within the last three  years: Yes ?Total Score: 0 ?Level Of Care: ____ ?[name], [name] ([id number]) ?Electronic Signature(s) ?Signed: 03/01/2022 4:44:20 PM By: Levora Dredge ?Entered  By: Levora Dredge on 03/01/2022 16:37:37 ?[name], [name] ([id number]) ?-------------------------------------------------------------------------------- ?Encounter Discharge Information Details ?Patient Name: [name], [name] ?Date of Service: 03/01/2022 2:15 PM ?Medical Record Number: [id number] ?Patient Account Number: [id number] ?Date of Birth/Sex: Sep 03, 1946 (76 y.o. F) ?Treating RN: Levora Dredge ?Primary Care Geanna Divirgilio: [name] Other Clinician: ?Referring Romaine Maciolek: [name] ?Treating Deolinda Frid/Extender: [name] Dillon ?Weeks in Treatment: 13 ?Encounter Discharge Information Items Post Procedure Vitals ?Discharge Condition: Stable ?Temperature (?F): 97.5 ?Ambulatory Status: Ambulatory ?Pulse (bpm): 79 ?Discharge Destination: Home ?Respiratory Rate (breaths/min): 18 ?Transportation: Private Auto ?Blood Pressure (mmHg): 159/76 ?Accompanied By: self ?Schedule Follow-up Appointment: Yes ?Clinical Summary of Care: Patient Declined ?Electronic Signature(s) ?Signed: 03/01/2022 4:39:25 PM By: Levora Dredge ?Entered By: Levora Dredge on 03/01/2022 16:39:25 ?[name], [name] ([id number]) ?-------------------------------------------------------------------------------- ?Lower Extremity Assessment Details ?Patient Name: [name], [name] ?Date of Service: 03/01/2022 2:15 PM ?Medical Record Number: [id number] ?Patient Account Number: [id number] ?Date of Birth/Sex: 05-17-1946 (76 y.o. F) ?Treating RN: Levora Dredge ?Primary Care Courteny Egler: [name] Other Clinician: ?Referring Joany Khatib: [name] ?Treating Cecilia Vancleve/Extender: [name] Dillon ?Weeks in Treatment: 13 ?Edema Assessment ?Assessed: [Left: No] [Right: No] ?Edema: [Left: Ye] [Right: s] ?Calf ?Left: Right: ?Point of Measurement: 30 cm From Medial Instep 38.4 cm ?Ankle ?Left:  Right: ?Point of Measurement: 13 cm From Medial Instep 22.4 cm ?Vascular Assessment ?Pulses: ?Dorsalis Pedis ?Palpable: [Right:Yes] ?Electronic Signature(s) ?Signed: 03/01/2022 4:44:20 PM By: Levora Dredge ?Entered By: Levora Dredge on 03/01/2022 14:43:50 ?[name], [name] ([id number]) ?-------------------------------------------------------------------------------- ?Multi Wound Chart Details ?Patient Name: [name], [name] ?Date of Service: 03/01/2022 2:15 PM ?Medical Record Number: [id number] ?Patient Account Number: [id number] ?Date of Birth/Sex: October 09, 1946 (76 y.o. F) ?Treating RN: Levora Dredge ?Primary Care Cathe Bilger: [name] Other Clinician: ?Referring Cliffard Hair: [name] ?Treating Cuahutemoc Attar/Extender: [name] Dillon ?Weeks in Treatment: 13 ?Vital Signs ?Height(in): 62 ?Pulse(bpm): 79 ?Weight(lbs): 199 ?Blood Pressure(mmHg): 159/76 ?Body Mass Index(BMI): 36.4 ?Temperature(??F): 97.5 ?Respiratory Rate(breaths/min): 18 ?Photos: [N/A:N/A] ?Wound Location: Right, Posterior Lower Leg Right, Medial Lower Leg N/A ?Wounding Event: Gradually Appeared Gradually Appeared N/A ?Primary Etiology: Lymphedema Lymphedema N/A ?Secondary Etiology: Venous Leg Ulcer Venous Leg Ulcer N/A ?Comorbid History: Cataracts, Asthma, Sleep Apnea, Cataracts, Asthma, Sleep Apnea, N/A ?Deep Vein Thrombosis, Deep Vein Thrombosis, ?Hypertension, Peripheral Venous Hypertension, Peripheral Venous ?Disease, Osteoarthritis, Received Disease, Osteoarthritis, Received ?Chemotherapy, Received Radiation Chemotherapy, Received Radiation ?Date Acquired: 10/27/2021 10/26/2021 N/A ?Weeks of Treatment: 13 9 N/A ?Wound Status: Open Open N/A ?Wound Recurrence: No No N/A ?Measurements L x W x D (cm) 0.4x0.4x0.1 0.3x0.2x0.1 N/A ?Area (cm?) : 0.126 0.047 N/A ?Volume (cm?) : 0.013 0.005 N/A ?% Reduction in Area: 99.90% 97.10% N/A ?% Reduction in Volume: 99.90% 97.00% N/A ?Classification: Full Thickness Without Exposed Full Thickness Without  Exposed N/A ?Support Structures Support Structures ?Exudate Amount: Medium Medium N/A ?Exudate Type: Serosanguineous Serosanguineous N/A ?Exudate Color: red, brown red, brown N/A ?Wound Margin: Indistinct, nonv

## 2022-03-02 DIAGNOSIS — R5381 Other malaise: Secondary | ICD-10-CM | POA: Diagnosis not present

## 2022-03-02 DIAGNOSIS — Z7409 Other reduced mobility: Secondary | ICD-10-CM | POA: Diagnosis not present

## 2022-03-02 DIAGNOSIS — Z9181 History of falling: Secondary | ICD-10-CM | POA: Diagnosis not present

## 2022-03-08 ENCOUNTER — Encounter: Payer: Medicare Other | Attending: Physician Assistant | Admitting: Physician Assistant

## 2022-03-08 DIAGNOSIS — Z85820 Personal history of malignant melanoma of skin: Secondary | ICD-10-CM | POA: Diagnosis not present

## 2022-03-08 DIAGNOSIS — Z8619 Personal history of other infectious and parasitic diseases: Secondary | ICD-10-CM | POA: Insufficient documentation

## 2022-03-08 DIAGNOSIS — I890 Lymphedema, not elsewhere classified: Secondary | ICD-10-CM | POA: Insufficient documentation

## 2022-03-08 DIAGNOSIS — Z95828 Presence of other vascular implants and grafts: Secondary | ICD-10-CM | POA: Diagnosis not present

## 2022-03-08 DIAGNOSIS — G629 Polyneuropathy, unspecified: Secondary | ICD-10-CM | POA: Diagnosis not present

## 2022-03-08 DIAGNOSIS — G4733 Obstructive sleep apnea (adult) (pediatric): Secondary | ICD-10-CM | POA: Insufficient documentation

## 2022-03-08 DIAGNOSIS — I87331 Chronic venous hypertension (idiopathic) with ulcer and inflammation of right lower extremity: Secondary | ICD-10-CM | POA: Diagnosis not present

## 2022-03-08 DIAGNOSIS — J45909 Unspecified asthma, uncomplicated: Secondary | ICD-10-CM | POA: Insufficient documentation

## 2022-03-08 DIAGNOSIS — R600 Localized edema: Secondary | ICD-10-CM | POA: Insufficient documentation

## 2022-03-08 DIAGNOSIS — L97212 Non-pressure chronic ulcer of right calf with fat layer exposed: Secondary | ICD-10-CM | POA: Diagnosis not present

## 2022-03-08 DIAGNOSIS — I10 Essential (primary) hypertension: Secondary | ICD-10-CM | POA: Insufficient documentation

## 2022-03-08 DIAGNOSIS — L97812 Non-pressure chronic ulcer of other part of right lower leg with fat layer exposed: Secondary | ICD-10-CM | POA: Diagnosis not present

## 2022-03-08 DIAGNOSIS — I872 Venous insufficiency (chronic) (peripheral): Secondary | ICD-10-CM | POA: Insufficient documentation

## 2022-03-08 DIAGNOSIS — L97811 Non-pressure chronic ulcer of other part of right lower leg limited to breakdown of skin: Secondary | ICD-10-CM | POA: Diagnosis not present

## 2022-03-08 NOTE — Progress Notes (Addendum)
Amber, [name] ([id number]) ?Visit Report for 03/08/2022 ?Chief Complaint Document Details ?Patient Name: [name], [name] ?Date of Service: 03/08/2022 1:45 PM ?Medical Record Number: [id number] ?Patient Account Number: [id number] ?Date of Birth/Sex: 11/11/1945 (76 y.o. F) ?Treating RN: Donnamarie Poag ?Primary Care Provider: [name] Other Clinician: ?Referring Provider: [name] ?Treating Provider/Extender: Jeri Cos ?Weeks in Treatment: 14 ?Information Obtained from: Patient ?Chief Complaint ?Right LE Ulcer ?Electronic Signature(s) ?Signed: 03/08/2022 2:04:57 PM By: Worthy Keeler PA-C ?Entered By: Worthy Keeler on 03/08/2022 14:04:57 ?Amber, [name] ([id number]) ?-------------------------------------------------------------------------------- ?HPI Details ?Patient Name: Amber, [name] ?Date of Service: 03/08/2022 1:45 PM ?Medical Record Number: [id number] ?Patient Account Number: [id number] ?Date of Birth/Sex: Sep 12, 1946 (76 y.o. F) ?Treating RN: Donnamarie Poag ?Primary Care Provider: [name] Other Clinician: ?Referring Provider: [name] ?Treating Provider/Extender: Jeri Cos ?Weeks in Treatment: 14 ?History of Present Illness ?HPI Description: Pleasant 76 year old with history of chronic venous insufficiency. No diabetes or peripheral vascular disease. Left ABI 1.29. ?Questionable history of left lower extremity DVT. ?She developed a recurrent ulceration on her left lateral calf in December 2015, which she attributes to poor diet and subsequent lower extremity ?edema. She underwent endovenous laser ablation of her left greater saphenous vein in 2010. She underwent laser ablation of accessory branch of ?left GSV in April 2016 by Dr. Kellie Simmering at [facility]. She was previously wearing Unna boots, which she tolerated well. ?Tolerating 2 layer compression and cadexomer iodine. ?She returns to clinic for follow-up and is without new complaints. She denies any significant pain at this time. She  reports persistent pain with ?pressure. No claudication or ischemic rest pain. No fever or chills. No drainage. ?READMISSION ?11/13/16; this is a 76 year old woman who is not a diabetic. She is here for a review of a painful area on her left medial lower extremity. I note that ?she was seen here previously last year for wound I believe to be in the same area. At that time she had undergone previously a left greater ?saphenous vein ablation by Dr. Kellie Simmering and she had a ablation of the anterior accessory branch of the left greater saphenous vein in March 2016. ?Seeing that the wound actually closed over. In reviewing the history with her today the ulcer in this area has been recurrent. She describes a ?biopsy of this area in 2009 that only showed stasis physiology. She also has a history of today malignant melanoma in the right shoulder for which ?she follows with Dr. 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 Do not see that she has ever had arterial studies in the left leg. She tells me she has a follow-up with Dr. Kellie Simmering in roughly 10 days ?In any case she developed the reopening of this area roughly a month ago. On the background of this she describes rapidly increasing edema ?which has responded to Lasix 40 mg and metolazone 2.5 mg as well as the patient's lymph massage. She has been told she has both venous ?insufficiency and lymphedema but she cannot tolerate compression stockings ?11/28/16; the patient saw Dr. Kellie Simmering recently. Per the patient he did arterial Dopplers in the office that did not show evidence of arterial ?insufficiency, per the patient he stated "treat this like an ordinary venous ulcer". She also saw her dermatologist Dr. Ronnald Ramp who felt that this was ?more of a vascular ulcer. In general things are improving although she  arrives today with increasing bilateral lower extremity edema  with weeping ?a deeper fluid through the wound on the left medial leg compatible with some degree of lymphedema ?12/04/16; the patient's wound is fully epithelialized but I don't think fully healed. We will do another week of depression with Promogran and TCA ?however I suspect we'll be able to discharge her next week. This is a very unusual-looking wound which was initially a figure-of-eight type wound ?lying on its side surrounded by petechial like hemorrhage. She has had venous ablation on this side. She apparently does not have an arterial ?issue per Dr. Kellie Simmering. She saw her dermatologist thought it was "vascular". Patient is definitely going to need ongoing compression and I talked ?about this with her today she will go to elastic therapy after she leaves here next week ?12/11/16; the patient's wound is not completely closed today. She has surrounding scar tissue and in further discussion with the patient it would ?appear that she had ulcers in this area in 2009 for a prolonged period of time ultimately requiring a punch biopsy of this area that only showed ?venous insufficiency. I did not previously pickup on this part of the history from the patient. ?12/18/16; the patient's wound is completely epithelialized. There is no open area here. She has significant bilateral venous insufficiency with ?secondary lymphedema to a mild-to-moderate degree she does not have compression stockings.. She did not say anything to me when I was in ?the room, she told our intake nurse that she was still having pain in this area. This isn't unusual recurrent small open area. She is going to go to ?elastic therapy to obtain compression stockings. ?12/25/16; the patient's wound is fully epithelialized. There is no open area here. The patient describes some continued episodic discomfort in this ?area medial left calf. However everything looks fine and healed here. She is been to elastic therapy and caught herself 15-20 mmHg  stockings, ?they apparently were having trouble getting 20-30 mm stockings in her size ?01/22/17; this is a patient we discharged from the clinic a month ago. She has a recurrent open wound on her medial left calf. She had 15 mm ?support stockings. I told her I thought she needed 20-30 mm compression stockings. She tells me that she has been ill with hospitalization ?secondary to asthma and is been found to have severe hypokalemia likely secondary to a combination of Lasix and metolazone. This morning she ?noted blistering and leaking fluid on the posterior part of her left leg. She called our intake nurse urgently and we was saw her this afternoon. She ?has not had any real discomfort here. I don't know that she's been wearing any stockings on this leg for at least 2-3 days. ABIs in this clinic were ?1.21 on the right and 1.3 on the left. She is previously seen vascular surgery who does not think that there is a peripheral arterial issue. ?01/30/17; Patient arrives with no open wound on the left leg. She has been to elastic therapy and obtained 20-3mhg below knee stockings and ?she has one on the right leg today. ?READMISSION ?02/19/18; this PMcgovernis a now 76year old patient we've had in this clinic perhaps 3 times before. I had last looked at her from January 2 February ?2018 with an area on the medial left leg. We discharged her on 12/25/16 however she had to be readmitted on 01/22/17 with a recurrence. I have ?in my notes that we discharged her on 20-30 mm stockings although she tells me  she was only wearing support hose because she cannot get ?stockings on predominantly related to her cervical spine surgery/issues. She has had previous ablations done by vein and vascular in [place] ?including a great saphenous vein ablation on the left with an anterior accessory branch ablation I think both of these were in 2016. On one of the ?previous visit she had a biopsy noted 2009 that was negative. She is not felt to  have an arterial issue. She is not a diabetic. She does have a ?history of obstructive sleep apnea hypertension asthma as well as chronic venous insufficiency and lymphedema. ?On this occasion she noted 2 dry scaly

## 2022-03-08 NOTE — Progress Notes (Signed)
[name], [name] ([id number]) ?Visit Report for 03/08/2022 ?Arrival Information Details ?Patient Name: [name], [name] ?Date of Service: 03/08/2022 1:45 PM ?Medical Record Number: [id number] ?Patient Account Number: [id number] ?Date of Birth/Sex: [birth year] (76 y.o. F) ?Treating RN: Donnamarie Poag ?Primary Care Ariyah Sedlack: [name] Other Clinician: ?Referring Mirayah Wren: [name] ?Treating Oslo Huntsman/Extender: Jeri Cos ?Weeks in Treatment: 14 ?Visit Information History Since Last Visit ?Added or deleted any medications: No ?Patient Arrived: Ambulatory ?Had a fall or experienced change in No ?Arrival Time: 13:43 ?activities of daily living that may affect ?Accompanied By: self ?risk of falls: ?Transfer Assistance: None ?Hospitalized since last visit: No ?Patient Identification Verified: Yes ?Has Dressing in Place as Prescribed: Yes ?Secondary Verification Process Completed: Yes ?Has Compression in Place as Prescribed: Yes ?Patient Requires Transmission-Based No ?Pain Present Now: No ?Precautions: ?Patient Has Alerts: Yes ?Patient Alerts: Patient on Blood ?Thinner ?'81mg'$  aspirin ?[place] vascular ABI ?Electronic Signature(s) ?Signed: 03/08/2022 4:00:30 PM By: Donnamarie Poag ?Entered ByDonnamarie Poag on 03/08/2022 13:48:36 ?[name], [name] ([id number]) ?-------------------------------------------------------------------------------- ?Compression Therapy Details ?Patient Name: Amber, [name] ?Date of Service: 03/08/2022 1:45 PM ?Medical Record Number: [id number] ?Patient Account Number: [id number] ?Date of Birth/Sex: 08/27/1946 (76 y.o. F) ?Treating RN: Donnamarie Poag ?Primary Care Dragon Thrush: [name] Other Clinician: ?Referring Latika Kronick: [name] ?Treating Jeniyah Menor/Extender: Jeri Cos ?Weeks in Treatment: 14 ?Compression Therapy Performed for Wound Assessment: Wound #15 Right,Posterior Lower Leg ?Performed By: Clinician Donnamarie Poag, RN ?Compression Type: Three Layer ?Post Procedure Diagnosis ?Same as  Pre-procedure ?Electronic Signature(s) ?Signed: 03/08/2022 4:00:30 PM By: Donnamarie Poag ?Entered ByDonnamarie Poag on 03/08/2022 14:06:20 ?Amber, [name] ([id number]) ?-------------------------------------------------------------------------------- ?Compression Therapy Details ?Patient Name: Amber, [name] ?Date of Service: 03/08/2022 1:45 PM ?Medical Record Number: [id number] ?Patient Account Number: [id number] ?Date of Birth/Sex: [birth year] (76 y.o. F) ?Treating RN: Donnamarie Poag ?Primary Care Irmalee Riemenschneider: [name] Other Clinician: ?Referring Danya Spearman: [name] ?Treating Dereck Agerton/Extender: Jeri Cos ?Weeks in Treatment: 14 ?Compression Therapy Performed for Wound Assessment: Wound #16 Right,Medial Lower Leg ?Performed By: Clinician Donnamarie Poag, RN ?Compression Type: Three Layer ?Post Procedure Diagnosis ?Same as Pre-procedure ?Electronic Signature(s) ?Signed: 03/08/2022 4:00:30 PM By: Donnamarie Poag ?Entered ByDonnamarie Poag on 03/08/2022 14:06:20 ?[name], [name] ([id number]) ?-------------------------------------------------------------------------------- ?Encounter Discharge Information Details ?Patient Name: [name], [name] ?Date of Service: 03/08/2022 1:45 PM ?Medical Record Number: [id number] ?Patient Account Number: [id number] ?Date of Birth/Sex: 24-May-1946 (76 y.o. F) ?Treating RN: Donnamarie Poag ?Primary Care Afra Tricarico: [name] Other Clinician: ?Referring Yani Lal: [name] ?Treating Gizel Riedlinger/Extender: Jeri Cos ?Weeks in Treatment: 14 ?Encounter Discharge Information Items ?Discharge Condition: Stable ?Ambulatory Status: Ambulatory ?Discharge Destination: Home ?Transportation: Private Auto ?Accompanied By: self ?Schedule Follow-up Appointment: Yes ?Clinical Summary of Care: ?Electronic Signature(s) ?Signed: 03/08/2022 4:00:30 PM By: Donnamarie Poag ?Entered ByDonnamarie Poag on 03/08/2022 14:09:09 ?[name], [name]  ([id number]) ?-------------------------------------------------------------------------------- ?Lower Extremity Assessment Details ?Patient Name: [name], [name] ?Date of Service: 03/08/2022 1:45 PM ?Medical Record Number: [id number] ?Patient Account Number: [id number] ?Date of Birth/Sex: May 14, 1946 (76 y.o. F) ?Treating RN: Donnamarie Poag ?Primary Care Keiyana Stehr: [name] Other Clinician: ?Referring Princetta Uplinger: [name] ?Treating Jumanah Hynson/Extender: Jeri Cos ?Weeks in Treatment: 14 ?Edema Assessment ?Assessed: [Left: No] [Right: Yes] ?[Left: Edema] [Right: :] ?Calf ?Left: Right: ?Point of Measurement: 30 cm From Medial Instep 36 cm ?Ankle ?Left: Right: ?Point of Measurement: 13 cm From Medial Instep 22.5 cm ?Electronic Signature(s) ?Signed: 03/08/2022 4:00:30 PM By: Donnamarie Poag ?Entered ByDonnamarie Poag on 03/08/2022 13:57:24 ?[name], [name] ([id number]) ?-------------------------------------------------------------------------------- ?Multi Wound Chart Details ?Patient Name: [name], [name]. ?Date of  Service: 03/08/2022 1:45 PM ?Medical Record Number: [id number] ?Patient Account Number: [id number] ?Date of Birth/Sex: 02/19/1946 (76 y.o. F) ?Treating RN: Donnamarie Poag ?Primary Care Keionte Swicegood: [name] Other Clinician: ?Referring Faithlynn Deeley: [name] ?Treating Ledonna Dormer/Extender: Jeri Cos ?Weeks in Treatment: 14 ?Vital Signs ?Height(in): 62 ?Pulse(bpm): 80 ?Weight(lbs): 199 ?Blood Pressure(mmHg): 153/84 ?Body Mass Index(BMI): 36.4 ?Temperature(??F): 97.1 ?Respiratory Rate(breaths/min): 16 ?Photos: [N/A:N/A] ?Wound Location: Right, Posterior Lower Leg Right, Medial Lower Leg N/A ?Wounding Event: Gradually Appeared Gradually Appeared N/A ?Primary Etiology: Lymphedema Lymphedema N/A ?Secondary Etiology: Venous Leg Ulcer Venous Leg Ulcer N/A ?Comorbid History: Cataracts, Asthma, Sleep Apnea, Cataracts, Asthma, Sleep Apnea, N/A ?Deep Vein Thrombosis, Deep Vein Thrombosis, ?Hypertension, Peripheral Venous  Hypertension, Peripheral Venous ?Disease, Osteoarthritis, Received Disease, Osteoarthritis, Received ?Chemotherapy, Received Radiation Chemotherapy, Received Radiation ?Date Acquired: 10/27/2021 10/26/2021 N/A ?Weeks of Treatment: 14 10 N/A ?Wound Status: Open Open N/A ?Wound Recurrence: No No N/A ?Measurements L x W x D (cm) 0.5x0.3x0.1 0.3x0.2x0.1 N/A ?Area (cm?) : 0.118 0.047 N/A ?Volume (cm?) : 0.012 0.005 N/A ?% Reduction in Area: 99.90% 97.10% N/A ?% Reduction in Volume: 99.90% 97.00% N/A ?Classification: Full Thickness Without Exposed Full Thickness Without Exposed N/A ?Support Structures Support Structures ?Exudate Amount: Medium Medium N/A ?Exudate Type: Serosanguineous Serosanguineous N/A ?Exudate Color: red, brown red, brown N/A ?Wound Margin: Indistinct, nonvisible N/A N/A ?Granulation Amount: Small (1-33%) Large (67-100%) N/A ?Granulation Quality: Pink, Pale Pink, Pale N/A ?Necrotic Amount: Large (67-100%) Small (1-33%) N/A ?Exposed Structures: ?Fat Layer (Subcutaneous Tissue): ?Fat Layer (Subcutaneous Tissue): N/A ?Yes Yes ?Fascia: No ?Tendon: No ?Muscle: No ?Joint: No ?Bone: No ?Epithelialization: Small (1-33%) None N/A ?Treatment Notes ?Electronic Signature(s) ?Signed: 03/08/2022 4:00:30 PM By: Donnamarie Poag ?[name], [name] ([id number]) ?Entered ByDonnamarie Poag on 03/08/2022 14:05:42 ?[name], [name] ([id number]) ?-------------------------------------------------------------------------------- ?Multi-Disciplinary Care Plan Details ?Patient Name: [name], [name] ?Date of Service: 03/08/2022 1:45 PM ?Medical Record Number: [id number] ?Patient Account Number: [id number] ?Date of Birth/Sex: [birth year] (76 y.o. F) ?Treating RN: Donnamarie Poag ?Primary Care Dhriti Fales: [name] Other Clinician: ?Referring Cyndi Montejano: [name] ?Treating Drena Ham/Extender: Jeri Cos ?Weeks in Treatment: 14 ?Active Inactive ?Necrotic Tissue ?Nursing Diagnoses: ?Impaired tissue integrity related to necrotic/devitalized  tissue ?Knowledge deficit related to management of necrotic/devitalized tissue ?Goals: ?Necrotic/devitalized tissue will be minimized in the wound bed ?Date Initiated: 11/27/2021 ?Target Resolution Date: 12/25/2021 ?Goal Status: Active ?Patient/caregiver will verbalize understanding of reason and proc

## 2022-03-15 ENCOUNTER — Encounter: Payer: Medicare Other | Admitting: Physician Assistant

## 2022-03-15 DIAGNOSIS — Z95828 Presence of other vascular implants and grafts: Secondary | ICD-10-CM | POA: Diagnosis not present

## 2022-03-15 DIAGNOSIS — I872 Venous insufficiency (chronic) (peripheral): Secondary | ICD-10-CM | POA: Diagnosis not present

## 2022-03-15 DIAGNOSIS — L97811 Non-pressure chronic ulcer of other part of right lower leg limited to breakdown of skin: Secondary | ICD-10-CM | POA: Diagnosis not present

## 2022-03-15 DIAGNOSIS — L97212 Non-pressure chronic ulcer of right calf with fat layer exposed: Secondary | ICD-10-CM | POA: Diagnosis not present

## 2022-03-15 DIAGNOSIS — I890 Lymphedema, not elsewhere classified: Secondary | ICD-10-CM | POA: Diagnosis not present

## 2022-03-15 DIAGNOSIS — I87331 Chronic venous hypertension (idiopathic) with ulcer and inflammation of right lower extremity: Secondary | ICD-10-CM | POA: Diagnosis not present

## 2022-03-15 DIAGNOSIS — L97812 Non-pressure chronic ulcer of other part of right lower leg with fat layer exposed: Secondary | ICD-10-CM | POA: Diagnosis not present

## 2022-03-15 DIAGNOSIS — R600 Localized edema: Secondary | ICD-10-CM | POA: Diagnosis not present

## 2022-03-15 NOTE — Progress Notes (Addendum)
[name], [name] ([id number]) ?Visit Report for 03/15/2022 ?Chief Complaint Document Details ?Patient Name: [name], [name] ?Date of Service: 03/15/2022 1:45 PM ?Medical Record Number: [id number] ?Patient Account Number: [id number] ?Date of Birth/Sex: 10/14/1946 (76 y.o. F) ?Treating RN: Donnamarie Poag ?Primary Care Provider: [name] Other Clinician: ?Referring Provider: [name] ?Treating Provider/Extender: Jeri Cos ?Weeks in Treatment: 15 ?Information Obtained from: Patient ?Chief Complaint ?Right LE Ulcer ?Electronic Signature(s) ?Signed: 03/15/2022 2:04:20 PM By: Worthy Keeler PA-C ?Entered By: Worthy Keeler on 03/15/2022 14:04:20 ?[name], [name] ([id number]) ?-------------------------------------------------------------------------------- ?HPI Details ?Patient Name: [name], [name] ?Date of Service: 03/15/2022 1:45 PM ?Medical Record Number: [id number] ?Patient Account Number: [id number] ?Date of Birth/Sex: 03-27-1946 (76 y.o. F) ?Treating RN: Donnamarie Poag ?Primary Care Provider: [name] Other Clinician: ?Referring Provider: [name] ?Treating Provider/Extender: Jeri Cos ?Weeks in Treatment: 15 ?History of Present Illness ?HPI Description: Pleasant 76 year old with history of chronic venous insufficiency. No diabetes or peripheral vascular disease. Left ABI 1.29. ?Questionable history of left lower extremity DVT. ?She developed a recurrent ulceration on her left lateral calf in December 2015, which she attributes to poor diet and subsequent lower extremity ?edema. She underwent endovenous laser ablation of her left greater saphenous vein in 2010. She underwent laser ablation of accessory branch of ?left GSV in April 2016 by Dr. Kellie Simmering at [facility]. She was previously wearing Unna boots, which she tolerated well. ?Tolerating 2 layer compression and cadexomer iodine. ?She returns to clinic for follow-up and is without new complaints. She denies any significant pain at this time.  She reports persistent pain with ?pressure. No claudication or ischemic rest pain. No fever or chills. No drainage. ?READMISSION ?11/13/16; this is a 76 year old woman who is not a diabetic. She is here for a review of a painful area on her left medial lower extremity. I note that ?she was seen here previously last year for wound I believe to be in the same area. At that time she had undergone previously a left greater ?saphenous vein ablation by Dr. Kellie Simmering and she had a ablation of the anterior accessory branch of the left greater saphenous vein in March 2016. ?Seeing that the wound actually closed over. In reviewing the history with her today the ulcer in this area has been recurrent. She describes a ?biopsy of this area in 2009 that only showed stasis physiology. She also has a history of today malignant melanoma in the right shoulder for which ?she follows with Dr. 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 Do not see that she has ever had arterial studies in the left leg. She tells me she has a follow-up with Dr. Kellie Simmering in roughly 10 days ?In any case she developed the reopening of this area roughly a month ago. On the background of this she describes rapidly increasing edema ?which has responded to Lasix 40 mg and metolazone 2.5 mg as well as the patient's lymph massage. She has been told she has both venous ?insufficiency and lymphedema but she cannot tolerate compression stockings ?11/28/16; the patient saw Dr. Kellie Simmering recently. Per the patient he did arterial Dopplers in the office that did not show evidence of arterial ?insufficiency, per the patient he stated "treat this like an ordinary venous ulcer". She also saw her dermatologist Dr. Ronnald Ramp who felt that this was ?more of a vascular ulcer. In general things are improving although she  arrives today with increasing bilateral lower extremity  edema with weeping ?a deeper fluid through the wound on the left medial leg compatible with some degree of lymphedema ?12/04/16; the patient's wound is fully epithelialized but I don't think fully healed. We will do another week of depression with Promogran and TCA ?however I suspect we'll be able to discharge her next week. This is a very unusual-looking wound which was initially a figure-of-eight type wound ?lying on its side surrounded by petechial like hemorrhage. She has had venous ablation on this side. She apparently does not have an arterial ?issue per Dr. Kellie Simmering. She saw her dermatologist thought it was "vascular". Patient is definitely going to need ongoing compression and I talked ?about this with her today she will go to elastic therapy after she leaves here next week ?12/11/16; the patient's wound is not completely closed today. She has surrounding scar tissue and in further discussion with the patient it would ?appear that she had ulcers in this area in 2009 for a prolonged period of time ultimately requiring a punch biopsy of this area that only showed ?venous insufficiency. I did not previously pickup on this part of the history from the patient. ?12/18/16; the patient's wound is completely epithelialized. There is no open area here. She has significant bilateral venous insufficiency with ?secondary lymphedema to a mild-to-moderate degree she does not have compression stockings.. She did not say anything to me when I was in ?the room, she told our intake nurse that she was still having pain in this area. This isn't unusual recurrent small open area. She is going to go to ?elastic therapy to obtain compression stockings. ?12/25/16; the patient's wound is fully epithelialized. There is no open area here. The patient describes some continued episodic discomfort in this ?area medial left calf. However everything looks fine and healed here. She is been to elastic therapy and caught herself 15-20 mmHg  stockings, ?they apparently were having trouble getting 20-30 mm stockings in her size ?01/22/17; this is a patient we discharged from the clinic a month ago. She has a recurrent open wound on her medial left calf. She had 15 mm ?support stockings. I told her I thought she needed 20-30 mm compression stockings. She tells me that she has been ill with hospitalization ?secondary to asthma and is been found to have severe hypokalemia likely secondary to a combination of Lasix and metolazone. This morning she ?noted blistering and leaking fluid on the posterior part of her left leg. She called our intake nurse urgently and we was saw her this afternoon. She ?has not had any real discomfort here. I don't know that she's been wearing any stockings on this leg for at least 2-3 days. ABIs in this clinic were ?1.21 on the right and 1.3 on the left. She is previously seen vascular surgery who does not think that there is a peripheral arterial issue. ?01/30/17; Patient arrives with no open wound on the left leg. She has been to elastic therapy and obtained 20-102mhg below knee stockings and ?she has one on the right leg today. ?READMISSION ?02/19/18; this PLightleis a now 76year old patient we've had in this clinic perhaps 3 times before. I had last looked at her from January 2 February ?2018 with an area on the medial left leg. We discharged her on 12/25/16 however she had to be readmitted on 01/22/17 with a recurrence. I have ?in my notes that we discharged her on 20-30 mm stockings although she tells me  she was only wearing support hose because she cannot get ?stockings on predominantly related to her cervical spine surgery/issues. She has had previous ablations done by vein and vascular in [place] ?including a great saphenous vein ablation on the left with an anterior accessory branch ablation I think both of these were in 2016. On one of the ?previous visit she had a biopsy noted 2009 that was negative. She is not felt to  have an arterial issue. She is not a diabetic. She does have a ?history of obstructive sleep apnea hypertension asthma as well as chronic venous insufficiency and lymphedema. ?On this occasion she noted 2 dry s

## 2022-03-15 NOTE — Progress Notes (Signed)
Amber, [name] ([id number]) ?Visit Report for 03/15/2022 ?Arrival Information Details ?Patient Name: [name], [name] ?Date of Service: 03/15/2022 1:45 PM ?Medical Record Number: [id number] ?Patient Account Number: [id number] ?Date of Birth/Sex: 07-23-1946 (76 y.o. F) ?Treating RN: Donnamarie Poag ?Primary Care Ahad Colarusso: [name] Other Clinician: ?Referring Sheala Dosh: [name] ?Treating Darreld Hoffer/Extender: Jeri Cos ?Weeks in Treatment: 15 ?Visit Information History Since Last Visit ?Added or deleted any medications: No ?Patient Arrived: Ambulatory ?Had a fall or experienced change in No ?Arrival Time: 13:49 ?activities of daily living that may affect ?Accompanied By: self ?risk of falls: ?Transfer Assistance: None ?Hospitalized since last visit: No ?Patient Identification Verified: Yes ?Has Dressing in Place as Prescribed: Yes ?Secondary Verification Process Completed: Yes ?Has Compression in Place as Prescribed: Yes ?Patient Requires Transmission-Based No ?Pain Present Now: No ?Precautions: ?Patient Has Alerts: Yes ?Patient Alerts: Patient on Blood ?Thinner ?'81mg'$  aspirin ?[place] vascular ABI ?Electronic Signature(s) ?Signed: 03/15/2022 3:52:15 PM By: Donnamarie Poag ?Entered ByDonnamarie Poag on 03/15/2022 13:50:44 ?Amber, [name] ([id number]) ?-------------------------------------------------------------------------------- ?Compression Therapy Details ?Patient Name: [name], [name] ?Date of Service: 03/15/2022 1:45 PM ?Medical Record Number: [id number] ?Patient Account Number: [id number] ?Date of Birth/Sex: Sep 12, 1946 (76 y.o. F) ?Treating RN: Donnamarie Poag ?Primary Care Nimo Verastegui: [name] Other Clinician: ?Referring Mukund Weinreb: [name] ?Treating Pankaj Haack/Extender: Jeri Cos ?Weeks in Treatment: 15 ?Compression Therapy Performed for Wound Assessment: Wound #16 Right,Medial Lower Leg ?Performed By: Clinician Donnamarie Poag, RN ?Compression Type: Three Layer ?Post Procedure Diagnosis ?Same as  Pre-procedure ?Electronic Signature(s) ?Signed: 03/15/2022 3:52:15 PM By: Donnamarie Poag ?Entered ByDonnamarie Poag on 03/15/2022 14:05:31 ?Amber, [name] ([id number]) ?-------------------------------------------------------------------------------- ?Compression Therapy Details ?Patient Name: [name], [name] ?Date of Service: 03/15/2022 1:45 PM ?Medical Record Number: [id number] ?Patient Account Number: [id number] ?Date of Birth/Sex: [birth year] (76 y.o. F) ?Treating RN: Donnamarie Poag ?Primary Care Dre Gamino: [name] Other Clinician: ?Referring Kelin Borum: [name] ?Treating Bow Buntyn/Extender: Jeri Cos ?Weeks in Treatment: 15 ?Compression Therapy Performed for Wound Assessment: Wound #15 Right,Posterior Lower Leg ?Performed By: Clinician Donnamarie Poag, RN ?Compression Type: Three Layer ?Post Procedure Diagnosis ?Same as Pre-procedure ?Electronic Signature(s) ?Signed: 03/15/2022 3:52:15 PM By: Donnamarie Poag ?Entered ByDonnamarie Poag on 03/15/2022 14:05:31 ?Amber, [name] ([id number]) ?-------------------------------------------------------------------------------- ?Encounter Discharge Information Details ?Patient Name: [name], [name] ?Date of Service: 03/15/2022 1:45 PM ?Medical Record Number: [id number] ?Patient Account Number: [id number] ?Date of Birth/Sex: 01-12-1946 (76 y.o. F) ?Treating RN: Donnamarie Poag ?Primary Care Riann Oman: [name] Other Clinician: ?Referring Brylinn Teaney: [name] ?Treating Temica Righetti/Extender: Jeri Cos ?Weeks in Treatment: 15 ?Encounter Discharge Information Items ?Discharge Condition: Stable ?Ambulatory Status: Ambulatory ?Discharge Destination: Home ?Transportation: Private Auto ?Accompanied By: self ?Schedule Follow-up Appointment: Yes ?Clinical Summary of Care: ?Electronic Signature(s) ?Signed: 03/15/2022 3:52:15 PM By: Donnamarie Poag ?Entered ByDonnamarie Poag on 03/15/2022 14:07:30 ?Amber, [name]  ([id number]) ?-------------------------------------------------------------------------------- ?Lower Extremity Assessment Details ?Patient Name: [name], [name] ?Date of Service: 03/15/2022 1:45 PM ?Medical Record Number: [id number] ?Patient Account Number: [id number] ?Date of Birth/Sex: Feb 05, 1946 (76 y.o. F) ?Treating RN: Donnamarie Poag ?Primary Care Keelie Zemanek: [name] Other Clinician: ?Referring Alessa Mazur: [name] ?Treating Margareth Kanner/Extender: Jeri Cos ?Weeks in Treatment: 15 ?Edema Assessment ?Assessed: [Left: No] [Right: Yes] ?Edema: [Left: N] [Right: o] ?Calf ?Left: Right: ?Point of Measurement: 30 cm From Medial Instep 37 cm ?Ankle ?Left: Right: ?Point of Measurement: 13 cm From Medial Instep 22 cm ?Vascular Assessment ?Pulses: ?Dorsalis Pedis ?Palpable: [Right:Yes] ?Electronic Signature(s) ?Signed: 03/15/2022 3:52:15 PM By: Donnamarie Poag ?Entered ByDonnamarie Poag on 03/15/2022 14:01:49 ?[name], [name] ([id number]) ?-------------------------------------------------------------------------------- ?Multi Wound Chart  Details ?Patient Name: [name], [name] ?Date of Service: 03/15/2022 1:45 PM ?Medical Record Number: [id number] ?Patient Account Number: [id number] ?Date of Birth/Sex: 27-Aug-1946 (76 y.o. F) ?Treating RN: Donnamarie Poag ?Primary Care Handy Mcloud: [name] Other Clinician: ?Referring [place]: [name] ?Treating Tersea Aulds/Extender: Jeri Cos ?Weeks in Treatment: 15 ?Vital Signs ?Height(in): 62 ?Pulse(bpm): 75 ?Weight(lbs): 199 ?Blood Pressure(mmHg): 157/67 ?Body Mass Index(BMI): 36.4 ?Temperature(??F): 96.1 ?Respiratory Rate(breaths/min): 16 ?Photos: [N/A:N/A] ?Wound Location: Right, Posterior Lower Leg Right, Medial Lower Leg N/A ?Wounding Event: Gradually Appeared Gradually Appeared N/A ?Primary Etiology: Lymphedema Lymphedema N/A ?Secondary Etiology: Venous Leg Ulcer Venous Leg Ulcer N/A ?Comorbid History: Cataracts, Asthma, Sleep Apnea, Cataracts, Asthma, Sleep Apnea, N/A ?Deep  Vein Thrombosis, Deep Vein Thrombosis, ?Hypertension, Peripheral Venous Hypertension, Peripheral Venous ?Disease, Osteoarthritis, Received Disease, Osteoarthritis, Received ?Chemotherapy, Received Radiation Chemotherapy, Received Radiation ?Date Acquired: 10/27/2021 10/26/2021 N/A ?Weeks of Treatment: 15 11 N/A ?Wound Status: Open Open N/A ?Wound Recurrence: No No N/A ?Measurements L x W x D (cm) 0.4x0.4x0.1 0.3x0.2x0.1 N/A ?Area (cm?) : 0.126 0.047 N/A ?Volume (cm?) : 0.013 0.005 N/A ?% Reduction in Area: 99.90% 97.10% N/A ?% Reduction in Volume: 99.90% 97.00% N/A ?Classification: Full Thickness Without Exposed Full Thickness Without Exposed N/A ?Support Structures Support Structures ?Exudate Amount: Medium Medium N/A ?Exudate Type: Serosanguineous Serosanguineous N/A ?Exudate Color: red, brown red, brown N/A ?Wound Margin: Indistinct, nonvisible N/A N/A ?Granulation Amount: Medium (34-66%) Medium (34-66%) N/A ?Granulation Quality: Pink, Pale Pink, Pale N/A ?Necrotic Amount: Medium (34-66%) Medium (34-66%) N/A ?Necrotic Tissue: Adherent UnionExposed Structures: ?Fat Layer (Subcutaneous Tissue): ?Fat Layer (Subcutaneous Tissue): N/A ?Yes Yes ?Fascia: No ?Fascia: No ?Tendon: No ?Tendon: No ?Muscle: No ?Muscle: No ?Joint: No ?Joint: No ?Bone: No ?Bone: No ?Epithelialization: Small (1-33%) None N/A ?Treatment Notes ?Electronic Signature(s) ?[name], [name] ([id number]) ?Signed: 03/15/2022 3:52:15 PM By: Donnamarie Poag ?Entered ByDonnamarie Poag on 03/15/2022 14:02:14 ?[name], [name] ([id number]) ?-------------------------------------------------------------------------------- ?Multi-Disciplinary Care Plan Details ?Patient Name: [name], [name] ?Date of Service: 03/15/2022 1:45 PM ?Medical Record Number: [id number] ?Patient Account Number: [id number] ?Date of Birth/Sex: 05-15-1946 (76 y.o. F) ?Treating RN: Donnamarie Poag ?Primary Care Angee Gupton: [name] Other Clinician: ?Referring Delanee Xin:  [name] ?Treating Yitzchok Carriger/Extender: Jeri Cos ?Weeks in Treatment: 15 ?Active Inactive ?Necrotic Tissue ?Nursing Diagnoses: ?Impaired tissue integrity related to necrotic/devitalized tissue ?Knowledge deficit related to management of necrotic/devitalized tissue ?Goals: ?Necro

## 2022-03-20 DIAGNOSIS — Z1231 Encounter for screening mammogram for malignant neoplasm of breast: Secondary | ICD-10-CM | POA: Diagnosis not present

## 2022-03-20 LAB — HM MAMMOGRAPHY

## 2022-03-22 ENCOUNTER — Encounter: Payer: Medicare Other | Admitting: Physician Assistant

## 2022-03-22 DIAGNOSIS — L97811 Non-pressure chronic ulcer of other part of right lower leg limited to breakdown of skin: Secondary | ICD-10-CM | POA: Diagnosis not present

## 2022-03-22 DIAGNOSIS — I890 Lymphedema, not elsewhere classified: Secondary | ICD-10-CM | POA: Diagnosis not present

## 2022-03-22 DIAGNOSIS — R600 Localized edema: Secondary | ICD-10-CM | POA: Diagnosis not present

## 2022-03-22 DIAGNOSIS — I87331 Chronic venous hypertension (idiopathic) with ulcer and inflammation of right lower extremity: Secondary | ICD-10-CM | POA: Diagnosis not present

## 2022-03-22 DIAGNOSIS — L97812 Non-pressure chronic ulcer of other part of right lower leg with fat layer exposed: Secondary | ICD-10-CM | POA: Diagnosis not present

## 2022-03-22 DIAGNOSIS — I872 Venous insufficiency (chronic) (peripheral): Secondary | ICD-10-CM | POA: Diagnosis not present

## 2022-03-22 DIAGNOSIS — Z95828 Presence of other vascular implants and grafts: Secondary | ICD-10-CM | POA: Diagnosis not present

## 2022-03-23 ENCOUNTER — Encounter: Payer: Self-pay | Admitting: Family Medicine

## 2022-03-23 NOTE — Progress Notes (Signed)
[name], [name] ([id number]) Visit Report for 03/22/2022 Chief Complaint Document Details Patient Name: [name], [name]. Date of Service: 03/22/2022 2:30 PM Medical Record Number: [id number] Patient Account Number: [id number] Date of Birth/Sex: [birth year] (76 y.o. F) Treating RN: Donnamarie Poag Primary Care Provider: [name] Other Clinician: Referring Provider: [name] Treating Provider/Extender: [name] in Treatment: 16 Information Obtained from: Patient Chief Complaint Right LE Ulcer Electronic Signature(s) Signed: 03/22/2022 2:24:44 PM By: Worthy Keeler PA-C Entered By: Worthy Keeler on 03/22/2022 14:24:44 [name], [name] ([id number]) -------------------------------------------------------------------------------- HPI Details Patient Name: [name], [name]. Date of Service: 03/22/2022 2:30 PM Medical Record Number: [id number] Patient Account Number: [id number] Date of Birth/Sex: [birth year] (76 y.o. F) Treating RN: Donnamarie Poag Primary Care Provider: [name] Other Clinician: Referring Provider: [name] Treating Provider/Extender: [name] in Treatment: 16 History of Present Illness HPI Description: Pleasant 76 year old with history of chronic venous insufficiency. No diabetes or peripheral vascular disease. Left ABI 1.29. Questionable history of left lower extremity DVT. She developed a recurrent ulceration on her left lateral calf in December 2015, which she attributes to poor diet and subsequent lower extremity edema. She underwent endovenous laser ablation of her left greater saphenous vein in 2010. She underwent laser ablation of accessory branch of left GSV in April 2016 by Dr. Kellie Simmering at [facility]. She was previously wearing Unna boots, which she tolerated well. Tolerating 2 layer compression and cadexomer iodine. She returns to clinic for follow-up and is without new complaints. She denies any significant pain at this time.  She reports persistent pain with pressure. No claudication or ischemic rest pain. No fever or chills. No drainage. READMISSION 11/13/16; this is a 76 year old woman who is not a diabetic. She is here for a review of a painful area on her left medial lower extremity. I note that she was seen here previously last year for wound I believe to be in the same area. At that time she had undergone previously a left greater saphenous vein ablation by Dr. Kellie Simmering and she had a ablation of the anterior accessory branch of the left greater saphenous vein in March 2016. Seeing that the wound actually closed over. In reviewing the history with her today the ulcer in this area has been recurrent. She describes a biopsy of this area in 2009 that only showed stasis physiology. She also has a history of today malignant melanoma in the right shoulder for which she follows with Dr. 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 Do not see that she has ever had arterial studies in the left leg. She tells me she has a follow-up with Dr. Kellie Simmering in roughly 10 days In any case she developed the reopening of this area roughly a month ago. On the background of this she describes rapidly increasing edema which has responded to Lasix 40 mg and metolazone 2.5 mg as well as the patient's lymph massage. She has been told she has both venous insufficiency and lymphedema but she cannot tolerate compression stockings 11/28/16; the patient saw Dr. Kellie Simmering recently. Per the patient he did arterial Dopplers in the office that did not show evidence of arterial insufficiency, per the patient he stated "treat this like an ordinary venous ulcer". She also saw her dermatologist Dr. Ronnald Ramp who felt that this was more of a vascular ulcer. In general things are improving although she  arrives today with increasing bilateral lower extremity  edema with weeping a deeper fluid through the wound on the left medial leg compatible with some degree of lymphedema 12/04/16; the patient's wound is fully epithelialized but I don't think fully healed. We will do another week of depression with Promogran and TCA however I suspect we'll be able to discharge her next week. This is a very unusual-looking wound which was initially a figure-of-eight type wound lying on its side surrounded by petechial like hemorrhage. She has had venous ablation on this side. She apparently does not have an arterial issue per Dr. Kellie Simmering. She saw her dermatologist thought it was "vascular". Patient is definitely going to need ongoing compression and I talked about this with her today she will go to elastic therapy after she leaves here next week 12/11/16; the patient's wound is not completely closed today. She has surrounding scar tissue and in further discussion with the patient it would appear that she had ulcers in this area in 2009 for a prolonged period of time ultimately requiring a punch biopsy of this area that only showed venous insufficiency. I did not previously pickup on this part of the history from the patient. 12/18/16; the patient's wound is completely epithelialized. There is no open area here. She has significant bilateral venous insufficiency with secondary lymphedema to a mild-to-moderate degree she does not have compression stockings.. She did not say anything to me when I was in the room, she told our intake nurse that she was still having pain in this area. This isn't unusual recurrent small open area. She is going to go to elastic therapy to obtain compression stockings. 12/25/16; the patient's wound is fully epithelialized. There is no open area here. The patient describes some continued episodic discomfort in this area medial left calf. However everything looks fine and healed here. She is been to elastic therapy and caught herself 15-20 mmHg  stockings, they apparently were having trouble getting 20-30 mm stockings in her size 01/22/17; this is a patient we discharged from the clinic a month ago. She has a recurrent open wound on her medial left calf. She had 15 mm support stockings. I told her I thought she needed 20-30 mm compression stockings. She tells me that she has been ill with hospitalization secondary to asthma and is been found to have severe hypokalemia likely secondary to a combination of Lasix and metolazone. This morning she noted blistering and leaking fluid on the posterior part of her left leg. She called our intake nurse urgently and we was saw her this afternoon. She has not had any real discomfort here. I don't know that she's been wearing any stockings on this leg for at least 2-3 days. ABIs in this clinic were 1.21 on the right and 1.3 on the left. She is previously seen vascular surgery who does not think that there is a peripheral arterial issue. 01/30/17; Patient arrives with no open wound on the left leg. She has been to elastic therapy and obtained 20-56mhg below knee stockings and she has one on the right leg today. READMISSION 02/19/18; this PTownleyis a now 76year old patient we've had in this clinic perhaps 3 times before. I had last looked at her from January 07 December 2016 with an area on the medial left leg. We discharged her on 12/25/16 however she had to be readmitted on 01/22/17 with a recurrence. I have in my notes that we discharged her on 20-30 mm stockings although she tells me  she was only wearing support hose because she cannot get stockings on predominantly related to her cervical spine surgery/issues. She has had previous ablations done by vein and vascular in [place] including a great saphenous vein ablation on the left with an anterior accessory branch ablation I think both of these were in 2016. On one of the previous visit she had a biopsy noted 2009 that was negative. She is not felt to  have an arterial issue. She is not a diabetic. She does have a history of obstructive sleep apnea hypertension asthma as well as chronic venous insufficiency and lymphedema. On this occasion she noted 2 dry scaly patch on her left leg. She tried to put lotion on this it didn't really help. There were 2 open areas.the patient has been seeing her primary physician from 02/05/18 through 02/14/18. She had Unna boots applied. The superior wound now on the lateral left leg has closed but she's had one wound that remains open on the lateral left leg. This is not the same spot as we dealt with in 2018. ABIs in this clinic were 1.3 bilaterally [name], [name] ([id number]) 02/26/18; patient has a small wound on the left lateral calf. Dimensions are down. She has chronic venous insufficiency and lymphedema. 03/05/18; small open area on the left lateral calf. Dimensions are down. Tightly adherent necrotic debris over the surface of the wound which was difficult to remove. Also the dressing [over collagen] stuck to the wound surface. This was removed with some difficulty as well. Change the primary dressing to Hydrofera Blue ready 03/12/18; small open area on the left lateral calf. Comes in with tightly adherent surface eschar as well as some adherent Hydrofera Blue. 03/19/18; open area on the left lateral calf. Again adherent surface eschar as well as some adherent Hydrofera Blue nonviable subcutaneous tissue. She complained of pain all week even with the reduction from 4-3 layer compression I put on last week. Also she had an increase in her ankle and calf measurements probably related to the same thing. 03/26/18; open area on the left lateral calf. A very small open area remains here. We used silver alginate starting last week as the Hydrofera Blue seem to stick to the wound bed. In using 4-layer compression 04/02/18; the open area in the left lateral calf at some adherent slough which I removed there is no open area  here. We are able to transition her into her own compression stocking. Truthfully I think this is probably his support hose. However this does not maintain skin integrity will be limited. She cannot put over the toe compression stockings on because of neck problems hand problems etc. She is allergic to the lining layer of juxta lites. We might be forced to use extremitease stocking should this fail READMIT 11/24/2018 Patient is now a 76 year old woman who is not a diabetic. She has been in this clinic on at least 3 previous occasions largely with recurrent wounds on her left leg secondary to chronic venous insufficiency with secondary lymphedema. Her situation is complicated by inability to get stockings on and an allergy to neoprene which is apparently a component and at least juxta lites and other stockings. As a result she really has not been wearing any stockings on her legs. She tells [place] that roughly 2 or 3 weeks ago she started noticing a stinging sensation just above her ankle on the left medial aspect. She has been diagnosed with pseudogout and she wondered whether this was what she was  experiencing. She tried to dress this with something she bought at the store however subsequently it pulled skin off and now she has an open wound that is not improving. She has been using Vaseline gauze with a cover bandage. She saw her primary doctor last week who put an Haematologist on her. ABIs in this clinic was 1.03 on the left 2/12; the area is on the left medial ankle. Odd-looking wound with what looks to be surface epithelialization but a multitude of small petechial openings. This clearly not closed yet. We have been using silver alginate under 3 layer compression with TCA 2/19; the wound area did not look quite as good this week. Necrotic debris over the majority of the wound surface which required debridement. She continues to have a multitude of what looked to be small petechial openings. She  reminds [place] that she had a biopsy on this initially during her first outbreak in 2015 in [place] dermatology. She expresses concern about this being a possible melanoma. She apparently had a nodular melanoma up on her shoulder that was treated with excision, lymph node removal and ultimately radiation. I assured her that this does not look anything like melanoma. Except for the petechial reaction it does look like a venous insufficiency area and she certainly has evidence of this on both sides 2/26; a difficult area on the left medial ankle. The patient clearly has chronic venous hypertension with some degree of lymphedema. The odd thing about the area is the small petechial hemorrhages. I am not really sure how to explain this. This was present last time and this is not a compression injury. We have been using Hydrofera Blue which I changed to last week 3/4; still using Hydrofera Blue. Aggressive debridement today. She does not have known arterial issues. She has seen Dr. Kellie Simmering at [facility] vein and vascular and and has an ablation on the left. [Anterior accessory branch of the greater saphenous]. From what I remember they did not feel she had an arterial issue. The patient has had this area biopsied in 2009 at [facility] dermatology and by her recollection they said this was "stasis". She is also follow-up with dermatology locally who thought that this was more of a vascular issue 3/11; using Hydrofera Blue. Aggressive debridement today. She does not have an arterial issue. We are using 3 layer compression although we may need to go to 4. The patient has been in for multiple changes to her wrap since I last saw her a week ago. She says that the area was leaking. I do not have too much more information on what was found 01/19/19 on evaluation today patient was actually being seen for a nurse visit when unfortunately she had the area on her left lateral lower extremity as well as weeping from the  right lower extremity that became apparent. Therefore we did end up actually seeing her for a full visit with myself. She is having some pain at this site as well but fortunately nothing too significant at this point. No fevers, chills, nausea, or vomiting noted at this time. 3/18-Patient is back to the clinic with the left leg venous leg ulcer, the ulcer is larger in size, has a surface that is densely adherent with fibrinous tissue, the Hydrofera Blue was used but is densely adherent and there was difficulty in removing it. The right lower extremity was also wrapped for weeping edema. Patient has a new area over the left lateral foot above the malleolus that is  small and appears to have no debris with intact surrounding skin. Patient is on increased dose of Lasix also as a means to edema management 3/25; the patient has a nonhealing venous ulcer on the medial left leg and last week developed a smaller area on the lateral left calf. We have been using Hydrofera Blue with a contact layer. 4/1; no major change in these wounds areas. Left medial and more recently left lateral calf. I tried Iodoflex last week to aid in debridement she did not tolerate this. She stated her pain was terrible all week. She took the top layer of the 4 layer compression off. 4/8; the patient actually looks somewhat better in terms of her more prominent left lateral calf wound. There is some healthy looking tissue here. She is still complaining of a lot of discomfort. 4/15; patient in a lot of pain secondary to sciatica. She is on a prednisone taper prescribed by her primary physician. She has the 2 areas one on the left medial and more recently a smaller area on the left lateral calf. Both of these just above the malleoli 4/22; her back pain is better but she still states she is very uncomfortable and now feels she is intolerant to the [employer]. No real change in the wounds we have been using Sorbact. She has been  previously intolerant to Iodoflex. There is not a lot of option about what we can use to debride this wound under compression that she no doubt needs. sHe states Ultram no longer works for her pain 4/29; no major change in the wounds slightly increased depth. Surface on the original medial wound perhaps somewhat improved however the more recent area on the lateral left ankle is 100% covered in very adherent debris we have been using Sorbact. She tolerates 4 layer compression well and her edema control is a lot better. She has not had to come in for a nurse check 5/6; no major change in the condition of the wounds. She did consent to debridement today which was done with some difficulty. Continuing Sorbact. She did not tolerate Iodoflex. She was in for a check of her compression the day after we wrapped her last week this was adjusted but nothing much was found 5/13; no major change in the condition or area of the wounds. I was able to get a fairly aggressive debridement done on the lateral left leg wound. Even using Sorbact under compression. She came back in on Friday to have the wrap changed. She says she felt uncomfortable on the lateral aspect of her ankle. She has a long history of chronic venous insufficiency including previous ablation surgery on this side. 5/20-Patient returns for wounds on left leg with both wounds covered in slough, with the lateral leg wound larger in size, she has been in 3 layer compression and felt more comfortable, she describes pain in ankle, in leg and pins and needles in foot, and is about to try Pamelor for this 6/3; wounds on the left lateral and left medial leg. The area medially which is the most recent of the 2 seems to have had the largest increase in [place], [name]. ([id number]) dimensions. We have been using Sorbac to try and debride the surface. She has been to see orthopedics they apparently did a plain x-ray that was indeterminant. Diagnosed her with  neuropathy and they have ordered an MRI to determine if there is underlying osteomyelitis. This was not high on my thought list but I suppose  it is prudent. We have advised her to make an appointment with vein and vascular in [place]. She has a history of a left greater saphenous and accessory vein ablations I wonder if there is anything else that can be done from a surgical point of view to help in these difficult refractory wounds. We have previously healed this wound on one occasion but it keeps on reopening [medial side] 6/10; deep tissue culture I did last week I think on the left medial wound showed both moderate E. coli and moderate staph aureus [MSSA]. She is going to require antibiotics and I have chosen Augmentin. We have been using Sorbact and we have made better looking wound surface on both sides but certainly no improvement in wound area. She was back in last Friday apparently for a dressing changes the wrap was hurting her outer left ankle. She has not managed to get a hold of vein and vascular in [place]. We are going to have to make her that appointment 6/17; patient is tolerating the Augmentin. She had an MRI that I think was ordered by orthopedic surgeon this did not show osteomyelitis or an abscess did suggest cellulitis. We have been using Sorbact to the lateral and medial ankles. We have been trying to arrange a follow-up appointment with vein and vascular in [place] or did her original ablations. We apparently an area sent the request to vein and vascular in [facility] 6/24; patient has completed the Augmentin. We do not yet have a vein and vascular appointment in [place]. I am not sure what the issue is here we have asked her to call tomorrow. We are using Sorbact. Making some improvements and especially the medial wound. Both surfaces however look better medial and lateral. 7/1; the patient has been in contact with vein and vascular in [place] but has not yet  received an appointment. Using Sorbact we have gradually improve the wound surface with no improvement in surface area. She is approved for Apligraf but the wound surface still is not completely viable. She has not had to come in for a dressing change 7/8; the patient has an appointment with vein and vascular on 7/31 which is a Friday afternoon. She is concerned about getting back here for [place] to dress her wounds. I think it is important to have them goal for her venous reflux/history of ablations etc. to see if anything else can be done. She apparently tested positive for 1 of the blood tests with regards to lupus and saw a rheumatologist. He has raised the issue of vasculitis again. I have had this thought in the past however the evidence seems overwhelming that this is a venous reflux etiology. If the rheumatologist tells me there is clinical and laboratory investigation is positive for lupus I will rethink this. 7/15; the patient's wound surfaces are quite a bit better. The medial area which was her original wound now has no depth although the lateral wound which was the more recent area actually appears larger. Both with viable surfaces which is indeed better. Using Sorbact. I wanted to use Apligraf on her however there is the issue of the vein and vascular appointment on 7/31 at 2:00 in the afternoon which would not allow her to get back to be rewrapped and they would no doubt remove the graft 7/22; the patient's wound surfaces have moderate amount of debris although generally look better. The lateral one is larger with 2 small satellite areas superiorly. We are waiting for her vein and  vascular appointment on 7/31. She has been approved for Apligraf which I would like to use after th 7/29; wound surfaces have improved no debridement is required we have been using Sorbact. She sees vein and vascular on Friday with this so question of whether anything can be done to lessen the likelihood of  recurrence and/or speed the healing of these areas. She is already had previous ablations. She no doubt has severe venous hypertension 8/5-Patient returns at 1 week, she was in [place] for 3 days by her podiatrist, we have been using so backed to the wound, she has increased pain in both the wounds on the left lower leg especially the more distal one on the lateral aspect 8/12-Patient returns at 1 week and she is agreeable to having debridement in both wounds on her left leg today. We have been using Sorbact, and vascular studies were reviewed at last visit 8/19; the patient arrives with her wounds fairly clean and no debridement is required. We have used Sorbact which is really done a nice job in cleaning up these very difficult wound surfaces. The patient saw Dr. Donzetta Matters of vascular surgery on 7/31. He did not feel that there was an arterial component. He felt that her treated greater saphenous vein is adequately addressed and that the small saphenous vein did not appear to be involved significantly. She was also noted to have deep venous reflux which is not treatable. Dr. Donzetta Matters mentioned the possibility of a central obstructive component leading to reflux and he offered her central venography. She wanted to discuss this or think about it. I have urged her to go ahead with this. She has had recurrent difficult wounds in these areas which do heal but after months in the clinic. If there is anything that can be done to reduce the likelihood of this I think it is worth it. 9/2 she is still working towards getting follow-up with Dr. Donzetta Matters to schedule her CT. Things are quite a bit worse venography. I put Apligraf on 2 weeks ago on both wounds on the medial and lateral part of her left lower leg. She arrives in clinic today with 3 superficial additional wounds above the area laterally and one below the wound medially. She describes a lot of discomfort. I think these are probably wrapped injuries. Does not  look like she has cellulitis. 07/20/2019 on evaluation today patient appears to be doing somewhat poorly in regard to her lower extremity ulcers. She in fact showed signs of erythema in fact we may even be dealing with an infection at this time. Unfortunately I am unsure if this is just infection or if indeed there may be some allergic reaction that occurred as a result of the Apligraf application. With that being said that would be unusual but nonetheless not impossible in this patient is one who is unfortunately allergic to quite a bit. Currently we have been using the Sorbact which seems to do as well as anything for her. I do think we may want to obtain a culture today to see if there is anything showing up there that may need to be addressed. 9/16; noted that last week the wounds look worse in 1 week follow-up of the Apligraf. Using Sorbact as of 2 days ago. She arrives with copious amounts of drainage and new skin breakdown on the back of the left calf. The wounds arm more substantial bilaterally. There is a fair amount of swelling in the left calf no overt DVT there  is edema present I think in the left greater than right thigh. She is supposed to go on 9/28 for CT venography. The wounds on the medial and lateral calf are worse and she has new skin breakdown posteriorly at least new for me. This is almost developing into a circumferential wound area The Apligraf was taken off last week which I agree with things are not going in the right direction a culture was done we do not have that back yet. She is on Augmentin that she started 2 days ago 9/23; dressing was changed by her nurses on Monday. In general there is no improvement in the wound areas although the area looks less angry than last week. She did get Augmentin for MSSA cultured on the 14th. She still appears to have too much swelling in the left leg even with 3 layer compression 9/30; the patient underwent her procedure on 9/28 by Dr.  Donzetta Matters at [facility]. She was discovered to have the common iliac vein measuring 12.2 mm but at the level of L4-L5 measured 3 mm. After stenting it measured 10 mm. 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 Rouleaux flow in the common femoral and femoral vein was observed much improved after stenting. We are using silver alginate to the wounds on the medial and lateral ankle on the left. 4 layer compression 10/7; the patient had fluid swelling around her knee and 4 layer compression. At the advice of vein and vascular this was reduced to 3 layer which she is tolerating better. We have been using silver alginate under 3 layer compression since last Friday 10/14; arrives with the areas on the left ankle looking a lot better. Inflammation in the area also a lot better. She came in for a nurse check on 10/9 10/21; continued nice improvement. Slight improvements in surface area of both the medial and lateral wounds on the left. A lot of the satellite lesions in the weeping erythema around these from stasis dermatitis is resolved. We have been using silver alginate [name], [name] ([id number]) 10/28; general improvement in the entire wound areas although not a lot of change in dimensions the wound certainly looks better. There is a lot less in terms of venous inflammation. Continue silver alginate this week however look towards Hydrofera Blue next week 11/4; very adherent debris on the medial wound left wound is not as bad. We have been using silver alginate. Change to [facility] today 11/11; very adherent debris on both wound areas. She went to vein and vascular last week and follow-up they put in [place] boot on this today. He says the [facility] was adherent. Wound is definitely not as good as last week. Especially on the left there the satellite lesions look more prominent 11/18; absolutely no better. erythema on lateral aspect with tenderness. 09/30/2019 on  evaluation today patient appears to actually be doing better. Dr. Dellia Nims did put her on doxycycline last week which I do believe has helped her at this point. Fortunately there is no signs of active infection at this time. No fevers, chills, nausea, vomiting, or diarrhea. I do believe he may want extend the doxycycline for 7 additional days just to ensure everything does completely cleared up the patient is in agreement with that plan. Otherwise she is going require some sharp debridement today 12/2; patient is completing a 2-week course of doxycycline. I gave her this empirically for inflammation as well as infection when I last saw her  2 weeks ago. All of this seems to be better. She is using silver alginate she has the area on the medial aspect of the larger area laterally and the 2 small satellite regions laterally above the major wound. 12/9; the patient's wound on the left medial and left lateral calf look really quite good. We have been using silver alginate. She saw vein and vascular in follow-up on 10/09/2019. She has had a previous left greater saphenous vein ablation by Dr. Oscar La in 2016. More recently she underwent a left common iliac vein stent by Dr. 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 The swelling is improved and certainly the wounds have improved. The patient shows [place] today area on the right medial calf there is almost no wound but leaking lymphedema. She says she start this started 3 or 4 days ago. She did not traumatize it. It is not painful. She does not wear compression on that side 12/16; the patient continues to do well laterally. Medially still requiring debridement. The area on the right calf did not materialize to anything and is not currently open. We wrapped this last time. She has support stockings for that leg although I am not sure they are going to provide adequate compression 12/23; the lateral wound looks stable. Medially still requiring debridement for  tightly adherent fibrinous debris. We've been using silver alginate. Surface area not any different 12/30; neither wound is any better with regards to surface and the area on the left lateral is larger. I been using silver alginate to the left lateral which look quite good last week and Sorbact to the left medial 11/11/2019. Lateral wound area actually looks better and somewhat smaller. Medial still requires a very aggressive debridement today. We have been using Sorbact on both wound areas 1/13; not much better still adherent debris bilaterally. I been using Sorbact. She has severe venous hypertension. Probably some degree of dermal fibrosis distally. I wonder whether tighter compression might help and I am going to try that today. We also need to work on the bioburden 1/20; using Sorbact. She has severe venous hypertension status post stent placement for pelvic vein compression. We applied gentamicin last time to see if we could reduce bioburden I had some discussion with her today about the use of pentoxifylline. This is occasionally used in this setting for wounds with refractory venous insufficiency. However this interacts with Plavix. She tells me that she was put on this after stent placement for 3 months. She will call Dr. Claretha Cooper office to discuss 1/27; we are using gentamicin under Sorbact. 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 She has a stent. Wound medially is measuring smaller this week. Laterally measuring slightly larger although she has some satellite lesions superiorly 2/3; gentamicin under Sorbact under 4-layer compression. 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 She has a stent on Plavix. Her wounds are measuring smaller this week. More substantially laterally where there is a satellite lesion superiorly. 2/10; gentamicin under Sorbac. 4-layer compression. Patient communicated with Dr. Donzetta Matters at vein and vascular in  [place]. He is okay with the patient coming off Plavix I will therefore start her on pentoxifylline for a 1 month trial. In general her wounds look better today. I had some concerns about swelling in the left thigh however she measures 61.5 on the right and 63 on the mid thigh which does not suggest there is any difficulty. The patient is not describing any pain. 2/17; gentamicin under  Sorbac 4-layer compression. She has been on pentoxifylline for 1 week and complains of loose stool. No nausea she is eating and drinking well 2/24; the patient apparently came in 2 days ago for a nurse visit when her wrap fell down. Both areas look a little worse this week macerated medially and satellite lesions laterally. Change to silver alginate today 3/3; wounds are larger today especially medially. She also has more swelling in her foot lower leg and I even noted some swelling in her posterior thigh which is tender. I wonder about the patency of her stent. Fortuitously she sees Dr. Claretha Cooper group on Friday 3/10; Mrs. Polinsky was seen by vein and vascular on 3/5. The patient underwent ultrasound. There was no evidence of thrombosis involving the IVC no evidence of thrombosis involving the right common iliac vein there is no evidence of thrombosis involving the right external iliac vein the left external vein is also patent. The right common iliac vein stent appears patent bilateral common femoral veins are compressible and appear patent. I was concerned about the left common iliac stent however it looks like this is functional. She has some edema in the posterior thigh that was tender she still has that this week. I also note they had trouble finding the pulses in her left foot and booked her for an ABI baseline in 4 weeks. She will follow up in 6 months for repeat IVC duplex. The patient stopped the pentoxifylline because of diarrhea. It does not look like that was being effective in any case. I have advised her  to go back on her aspirin 81 mg tablet, vascular it also suggested this 3/17; comes in today with her wound surfaces a lot better. The excoriations from last week considerably better probably secondary to the TCA. We have been using silver alginate 3/24; comes in today with smaller wounds both medially and laterally. Both required debridement. There are 2 small satellite areas superiorly laterally. She also has a very odd bandlike area in the mid calf almost looking like there was a weakness in the wrap in a localized area. I would write this off as being this however anteriorly she has a small raised ballotable area that is very tender almost reminiscent of an abscess but there was no obvious purulent surface to it. 02/04/20 upon evaluation today patient appears to be doing fairly well in regard to her wounds today. Fortunately there is no signs of active infection at this time. No fevers, chills, nausea, vomiting, or diarrhea. She has been tolerating the dressing changes without complication. Fortunately I feel like she is showing signs of improvement although has been sometime since have seen her. Nonetheless the area of concern that Dr. Dellia Nims had last week where she had possibly an area of the wrap that was we can allow the leg to bulge appears to be doing significantly better today there is no signs of anything worsening. [name], [name] ([id number]) 4/7; the patient's wounds on her medial and lateral left leg continue to contract. We have been using a regular alginate. Last week she developed an area on the right medial lower leg which is probably a venous ulcer as well. 4/14; the wounds on her left medial and lateral lower leg continue to contract. Surface eschar. We have been using regular alginate. The area on the right medial lower leg is closed. We have been putting both legs under 4-layer contraction. The patient went back to see vein and vascular she had arterial studies done  which  were apparently "quite good" per the patient although I have not read their notes I have never felt she had an arterial issue. 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 This is been longstanding and refractory to exercise, leg elevation and longstanding use of compression wraps in our clinic as well as compression stockings on the times we have been able to get these to heal 4/21; we thought she actually might be close this week however she arrives in clinic with a lot of edema in her upper left calf and into her posterior thigh. This is been an intermittent problem here. She says the wrap fell down but it was replaced with a nurse visit on Monday. We are using calcium alginate to the wounds and the wound sizes there not terribly larger than last week but there is a lot more edema 4/28; again wound edges are smaller on both sides. Her edema is better controlled than last time. She is obtained her compression pumps from [employer] although they have not been to her home to set these up. 5/5; left medial and left lateral both look stable. I am not sure the medial is any smaller. We have been using calcium alginate under 4-layer compression. oShe had an area on the right medial. This was eschared today. We have been wrapping this as well. She does not tolerate external compression stockings due to a history of various contact allergies. She has her compression pumps however the representative from the company is coming on her to show her how to use these tomorrow 5/19; patient with severe chronic venous insufficiency secondary to central venous disease. She had a stent placed in her left common iliac vein. She has done better since but still difficult to control wounds. She comes in today with nothing open on the right leg. Her areas on the left medial and left lateral are just about closed. We are using calcium alginate under 4-layer compression.  She is using her external compression pumps at home She only has 15-20 support stockings. States she cannot get anything tighter than that on. 03/30/20-Patient returns at 1 week, the wounds on the left leg are both slightly bigger, the last week she was on 3 layer compression which started to slide down. She is starting to use her lymphedema pumps although she stated on 1 day her right ankle started to swell up and she have to stop that day. Unfortunately the open area seem to oscillate between improving to the point of healing and then flaring up all to do with effectiveness of compression or lack of due to the left leg topography not keeping the compression wraps from rolling down 6/2 patient comes in with a 15/20 mmHg stocking on the right leg. She tells me that she developed a lot of swelling in her ankles she saw orthopedics she was felt to possibly be having a flare of pseudogout versus some other type of arthritis. She was put on steroids for a respiratory issue so that helps with the inflammation. She has not been using the pumps all week. She thinks the left thigh is more swollen than usual and I would agree with that. She has an appointment with Dr. Donzetta Matters 9 days or so from now 6/9; both wounds on the left medial and left lateral are smaller. We have been using calcium alginate under compression. She does not have an open wound on the right leg she is using a stocking and her  compression pumps things are going well. She has an appointment with Dr. Donzetta Matters with regards to her stent in the left common iliac vein 6/16; the wounds on the left medial and left lateral ankle continues to contract. The patient saw Dr. Donzetta Matters and I think he seems satisfied. Ordered follow-up venous reflux studies on both sides in September. Cautioned that she may need thigh-high stockings. She has been using calcium alginate under compression on the left and her own stocking on the right leg. She tells [place] there are no open  wounds on the right 6/23; left lateral is just about closed. Medial required debridement today. We have been using calcium alginate. Extensive discussion about the compression pumps she is only using these on 25 mmHg states she could not take 40 or 30 when the wrap came out to her home to demonstrate these. He said they should not feel tight 6/30; the left lateral wound has a slight amount of eschar. . The area medially is about the same using Hydrofera Blue. 7/7; left lateral wound still has some eschar. I will remove this next week may be closed. The area medially is very small using Hydrofera Blue with improvement. Unfortunately the stockings fell down. Unfortunately the blisters have developed at the edge of where the wrap fell. When this happened she says her legs hurt she did not use her pumps. We are not open Monday for her to come in and change the wraps and she had an appointment yesterday. She also tells me that she is going to have an MRI of her back. She is having pain radiating into her left anterior leg she thinks her from an L5 disc. She saw Dr. Ellene Route of neurosurgery 7/14; the area on the left lateral ankle area is closed. Still a small area medially however it looks better as well. We have been using Hydrofera Blue under 4-layer compression 7/21; left lateral ankle is still closed however her wound on the medial left calf is actually larger. This is probably because Hydrofera Blue got stuck to the wound. She came in for a nurse change on Friday and will do that again this week I was concerned about the amount of swelling that she had last week however she is using her compression pumps twice a day and the swelling seems well controlled 7/28; remaining wound on the left medial lower leg is smaller. We have been using moistened silver collagen under compression she is coming back for a nurse visit. For reasons that were not really clear she was just keeping her legs elevated and not  using her compression pumps. I have asked her to use the compression pumps. She does not have any wounds on the right leg 06/15/20-Patient returns at 2 weeks, her LLE edema is worse and she developed a blister wound that is new and has bigger posterior calf wound on right, we are using Prisma with pad, 4 layer compression. she has been on lasix 40 mg daily 8/18; patient arrives today with things a lot worse than I remember from a few weeks ago. She was seen last week. Noted that her edema was worse and that she now had a left lateral wound as well as deteriorating edema in the medial and posterior part of the lower leg. She says she is using his or her external compression pumps once a day although I wonder about the compliance. 8/25; weeping area on the right medial lower leg. This had actually gotten a small localized area  of her compression stocking wet. oOn the left side there is a large denuded area on the posterior medial lower leg and smaller area on the lateral. This was not the original areas that we dealt with. 9/1 the patient's wound on the left leg include the left lateral and left posterior. Larger superficial wounds weeping. She has very poor edema control. Tender localized edema in the left lower medial ankle/heel probably because of localized wrap issues. She freely admits she is not using the compression pumps. She has been up on her feet a lot. She thinks the hydrofera blue is contributing to the pain she is experiencing.. This is a complaint that I have occasionally heard 9/8; really not much improvement. The patient is still complaining of a lot of pain particularly when she uses compression pumps. I switched her to silver alginate last time because she found the Hydrofera Blue to be irritating. I don't hear much difference in her description with the silver alginate. 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 She has follow-up with Dr. Donzetta Matters I think for ultrasound next month [name], [name] ([id number]) 9/15; quite a bit of improvement today. We have less edema and more epithelialization in both of her wound areas on the left medial and left lateral calf. These are not the site of her original wounds in this area. She says she has been using her compression pumps for 30 minutes twice a day, there pain issues that never quite understood. Silver alginate as the primary dressing 9/22; continued improvement. Both areas medially and laterally still have a small open area there is some eschar. She continues to complain of left medial ankle pain. Swelling in the leg is in much better condition. We have been using silver alginate 9/29; continued improvement. Both areas medially and laterally in the left calf look as though they are close some minor surface eschar but I think this is epithelialized. She comes in today saying she has a ruptured disc at L4-L5 cannot bend over to put on her stockings. 10/6; patient comes in today with no open wounds on either leg. However her edema on the left leg in the upper one third of the lower leg is poorly controlled nonpitting. She says that she could not use the pumps for 2 days and then she has been using the last couple of days. It is not clear to me she has been able to get her stocking on. She has back problems. [name] has severe chronic venous insufficiency with secondary lymphedema. Her venous insufficiency is partially centrally mediated and that she is now post stent in the left common iliac vein. [facility] Thurner's syndrome/physiology]. She follows up with them on 10/15. She wears 20/30 below-knee stockings. She is supposed to use compression pumps at home although I think her compliance about with this is been less than 100%. I have asked her to use these 3 times a day. Finally I think she has lipodermatosclerosis in the left lower leg with an inverted  bottle sign. It is been a major problem controlling the edema in the left leg. The right leg we have had wounds on but not as significant a problem is on the left READMISSION 04/12/2021 [name] is a 76 year old woman we know well in this clinic. She has severe chronic venous insufficiency. 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 I believe she has had bilateral greater saphenous vein ablation in the past as well. She tells me that this wound opened sometime in March. She had a fall and thinks it was initially abrasion. She developed areas she describes as little blisters on the anterior part of her leg and she saw dermatology and was treated for methicillin staph aureus with several rounds of antibiotics. She has been using support stockings on the left leg and says this is the only thing she can get on. Her compression pump use maybe once a day she says if she did not use one she use the other. She comes in today with incredible swelling in the left leg with a wound on the left posterior calf. She has been using Neosporin to this previously a hydrocolloid. 6/15; patient arrives back for 1 week follow-up.Marland Kitchen Apparently her wrap fell down she did not call us to replace this. He has poor edema control. She only uses her compression pumps once a day 6/29; patient presents for 1 week follow-up. She has tolerated the compression wrap well. We have been using Iodoflex under the wrap. She has no issues or complaints today. 7/6; patient presents for 1 week follow-up. She states that the compression wrap rolled down her leg 4 days ago. She has been trying to keep the area covered but has no dressings at home to use. She denies signs of infection. 7/13; patient presents for 1 week follow-up. She states that her compression wrap rolled down her right leg and she called our office and had it placed a few days ago. She has been tolerating the current wrap well. She  states that the Iodoflex is causing a burning sensation. She denies signs of infection. 7/27; patient presents for 1 week follow-up She has tolerated the compression wrap well with Hydrofera Blue underneath. She has now developed a wound to her right lower extremity. She reports having a culture done To this area by her dermatologist that she reports is negative. She currently denies signs of infection. 8/30; patient presents for 1 week follow-up. On the left side she has tolerated the compression wrap well with Hydrofera Blue underneath. She reports some discomfort to her right lower extremity with the 3 layer compression. She currently denies signs of infection. 06/14/2021 upon evaluation today patient's wound actually appears to be doing decently well based on what I am seeing currently. She actually has 2 areas 1 on the left distal/posterior lower leg and the other on the right lower leg. Subsequently the measurements are roughly the same may be slightly smaller but in general have not made any trend towards getting worse which is great news. 06/23/2021 upon evaluation today patient appears to be doing pretty well in regard to her wounds. On the right she is having difficulty with sciatic pain and this subsequently has led to her taking the wrap off over the last week her leg is more swollen she is also been on prednisone for 10 days. With that being said I think we may want to just use a compression sock on the left that way she will not have to worry about the compression wrap being in place that she cannot get off. With that being said I do believe as well that the patient is going to need to continue with the wrapping on the left we will also need to do a little bit of sharp debridement today. 8/24; patient presents for 1 week follow-up. She has no issues  or complaints today. She denies signs of infection. 8/31; patient presents for 1 week follow-up. She has no issues or complaints today. She  has tolerated the compression wrap well on the left lower extremity. She denies signs of infection. 9/7; patient presents for follow-up. She reports that the compression wrap rolled down slightly on her right lower extremity. She reports tenderness to the wound bed. She denies increased warmth or erythema to the surrounding skin. She overall feels well. 9/14; patient presents for follow-up. She has no issues or complaints today. She denies signs of infection. PuraPly is available and patient would like to start this today. 9/21; patient presents for follow-up. She has no issues or complaints today. She tolerated the first skin substitute placement last week under compression. 9/28; patient presents for 1 week follow-up. She has no issues or complaints today. 10/5; patient presents for 1 week follow-up. She reports taking the wrap off yesterday. She was on her feet more yesterday and developed more swelling to her left lower extremity. She denies pain. Overall she is doing well. 10/12; patient presents for 1 week follow-up. She has no issues or complaints today. 10/19; patient presents for follow-up. She has no issues or complaints today. She tolerated the compression wrap well. She states she has compression stockings at home. [name], [name] ([id number]) Readmission: 11/27/2021 upon evaluation today patient appears to be doing poorly in regard to the wounds on her right leg. She also has an area of erythema in the proximal calf area of the right leg which again I do believe is a issue from the standpoint of it being warm and erythematous to touch and also seems to be somewhat localized I really think this may be more of a cellulitis issue than anything else. Fortunately I do not see any signs of active infection Systemically at this time. 12/05/2021 upon evaluation patient's wounds actually appear to be showing some signs of improvement which is not too bad and just 1 week's time. Especially  since we had to get on the right antibiotic which is only been for the past 4 days. Fortunately I do not see any signs of active infection locally nor systemically at this time that has gotten any worse. Overall I think that again with the antibiotic she is significantly improved and currently she is taking Levaquin. 12/11/2021 upon evaluation today patient appears to be doing well with regard to her leg. Between the antibiotics and the triamcinolone I feel like she is doing significantly better which is great news. Overall I am extremely pleased with where we stand. There does not appear to be any signs of active infection locally or systemically at this time. 12/18/2021 upon evaluation today patient appears to be doing well with regard to her wound. I do feel like this is showing signs of some improvement here. Little by little this is clearing up quite nicely. I think it is just a slow process but nonetheless 1 that we are seeing evidence of improvement with regard to. 12/25/2021 upon evaluation today patient appears to be doing well with regard to her wounds she is definitely making some good progress here. Fortunately I do not see any signs of active infection locally or systemically at this time which is great news. No fevers, chills, nausea, vomiting, or diarrhea. 01/02/2022 upon evaluation today patient appears to be doing well with regard to her wound. She has been tolerating the dressing changes without complication. Fortunately I do not see any evidence of active infection  locally or systemically which is great news and overall I am extremely pleased with where we stand today. 01/08/2022 upon evaluation today patient appears to be doing better in regard to her ankle ulcers. She has been tolerating the dressing changes without complication. Fortunately I do not see any signs of infection and both the ankle and leg area is making great progress. 3/13; patient presents for follow-up. She has no  issues or complaints today. She was tolerated the compression wrap well. 01/19/2022 upon evaluation today patient appears to be doing well currently in regard to her right leg which overall I think is doing excellent. She had called in to try to get her in sooner for a dressing change due to an issue where her cat scratched her on the left leg. With that being said it does not appear that this was actually a scratch but rather that it was a puncture over a small varicose vein. She had a tremendous amount of bleeding as evidenced by the amount of blood in her shoes currently. The good news is this is stopped currently the bad news is that she needs to be very careful with regard to her cats later as this could definitely be a significant problem for her. Fortunately I do not see any signs of active infection locally nor systemically at this point. 02-08-2022 on evaluation today patient actually appears to be doing significantly better. She was in the hospital due to having stepped on attack on her left heel but nonetheless this is actually showing signs of improvement at this point. I do not see any evidence of active infection locally or systemically which is great news and overall I am extremely pleased with where we stand today. Her right leg is showing no signs of infection. She has been on antibiotics while in the hospital and at discharge. 02-15-2022 upon evaluation today patient appears to be doing better in regard to her wounds. Overall I feel like we are headed in the right direction. With that being said she does have an area of irritation posteriorly that was not there last week again that makes this wound posterior look little bigger but overall things are significantly better. I am very pleased in that regard. 02-23-2022 upon evaluation today patient appears to be doing well with regard to her wound. It is doing a little bit worse only and the fact that it is dry other than that things seem to  be making good improvements here. There does not appear to be any signs of active infection at this time which is great news. 4/27; right lateral leg 4-5 small open areas with eschar on top. I used a curette to remove this. She has been using Xeroform under 3 layer compression. 03-08-2022 upon evaluation today patient appears to be doing well with regard to the wounds on her lower extremity. She has been tolerating the dressing changes without complication. Fortunately there does not appear to be any signs of active infection at this time which is great news. No fevers, chills, nausea, vomiting, or diarrhea. 03-15-2022 upon evaluation today patient appears to be doing well currently in regard to her wounds. She has been tolerating the dressing changes without complication. Fortunately there does not appear to be any evidence of active infection locally nor systemically at this time which is great news. I do believe she is getting very close to resolution. 03-22-2022 upon evaluation today patient actually appears to be doing awesome in fact she is has 1 very  tiny area immediately left open the rest of this appears to be almost completely closed. I do not see any evidence of infection locally or systemically which is great news and overall I am extremely pleased with where things stand at this point. I do think we are getting very close to complete resolution. Electronic Signature(s) Signed: 03/22/2022 4:23:54 PM By: Worthy Keeler PA-C Entered By: Worthy Keeler on 03/22/2022 16:23:53 [name], [name] ([id number]) -------------------------------------------------------------------------------- Physical Exam Details Patient Name: ISRAEL, WUNDER. Date of Service: 03/22/2022 2:30 PM Medical Record Number: [id number] Patient Account Number: [id number] Date of Birth/Sex: [birth year] (76 y.o. F) Treating RN: Donnamarie Poag Primary Care Provider: [name] Other Clinician: Referring Provider:  [name] Treating Provider/Extender: [name] in Treatment: 62 Constitutional [name] and well-hydrated in no acute distress. Respiratory normal breathing without difficulty. Psychiatric this patient is able to make decisions and demonstrates good insight into disease process. Alert and Oriented x 3. pleasant and cooperative. Notes Upon inspection patient's wounds actually are all healed except for just a very tiny area on the medial aspect of her right ankle region. This is still pretty tender to touch nonetheless. She overall though does seem to be doing a lot better. I think were very close to complete resolution. Electronic Signature(s) Signed: 03/22/2022 5:28:06 PM By: Worthy Keeler PA-C Entered By: Worthy Keeler on 03/22/2022 17:28:06 ARMILDA, VANDERLINDEN ([id number]) -------------------------------------------------------------------------------- Physician Orders Details Patient Name: KAIDYN, HERNANDES. Date of Service: 03/22/2022 2:30 PM Medical Record Number: [id number] Patient Account Number: [id number] Date of Birth/Sex: [birth year] (76 y.o. F) Treating RN: Donnamarie Poag Primary Care Provider: [name] Other Clinician: Referring Provider: [name] Treating Provider/Extender: [name] in Treatment: [phone] Verbal / Phone Orders: No Diagnosis Coding ICD-10 Coding Code Description I89.0 Lymphedema, not elsewhere classified I87.331 Chronic venous hypertension (idiopathic) with ulcer and inflammation of right lower extremity L97.811 Non-pressure chronic ulcer of other part of right lower leg limited to breakdown of skin Follow-up Appointments o Return Appointment in 1 week. o Nurse Visit as needed Bathing/ Shower/ Hygiene o May shower with wound dressing protected with water repellent cover or cast protector. o No tub bath. Anesthetic (Use 'Patient Medications' Section for Anesthetic Order Entry) o Lidocaine applied to wound  bed Edema Control - Lymphedema / Segmental Compressive Device / Other o Optional: One layer of unna paste to top of compression wrap (to act as an anchor). - she needs this o Elevate, Exercise Daily and Avoid Standing for Long Periods of Time. o Elevate leg(s) parallel to the floor when sitting. o DO YOUR BEST to sleep in the bed at night. DO NOT sleep in your recliner. Long hours of sitting in a recliner leads to swelling of the legs and/or potential wounds on your backside. Additional Orders / Instructions o Follow Nutritious Diet and Increase Protein Intake Wound Treatment Wound #16 - Lower Leg Wound Laterality: Right, Medial Cleanser: Soap and Water 1 x Per Week/15 Days Discharge Instructions: Gently cleanse wound with antibacterial soap, rinse and pat dry prior to dressing wounds Cleanser: Wound Cleanser 1 x Per Week/15 Days Discharge Instructions: Wash your hands with soap and water. Remove old dressing, discard into plastic bag and place into trash. Cleanse the wound with Wound Cleanser prior to applying a clean dressing using gauze sponges, not tissues or cotton balls. Do not scrub or use excessive force. Pat dry using gauze sponges, not tissue or cotton balls. Primary Dressing: Xeroform-HBD 2x2 (in/in) 1 x Per Week/15  Days Discharge Instructions: Apply Xeroform-HBD 2x2 (in/in) as directed Secondary Dressing: Gauze 1 x Per Week/15 Days Compression Wrap: 3-LAYER WRAP - Profore Lite LF 3 Multilayer Compression Bandaging System 1 x Per Week/15 Days Discharge Instructions: Apply 3 multi-layer wrap as prescribed. Electronic Signature(s) Signed: 03/22/2022 4:31:13 PM By: Donnamarie Poag Signed: 03/22/2022 5:51:16 PM By: Worthy Keeler PA-C Entered By: Donnamarie Poag on 03/22/2022 15:05:19 Borden, [name] ([id number]) -------------------------------------------------------------------------------- Problem List Details Patient Name: BRENLY, TRAWICK. Date of Service: 03/22/2022 2:30  PM Medical Record Number: [id number] Patient Account Number: [id number] Date of Birth/Sex: 1946/06/20 (76 y.o. F) Treating RN: Donnamarie Poag Primary Care Provider: [name] Other Clinician: Referring Provider: [name] Treating Provider/Extender: [name] in Treatment: 16 Active Problems ICD-10 Encounter Code Description Active Date MDM Diagnosis I89.0 Lymphedema, not elsewhere classified 11/27/2021 No Yes I87.331 Chronic venous hypertension (idiopathic) with ulcer and inflammation of 11/27/2021 No Yes right lower extremity L97.811 Non-pressure chronic ulcer of other part of right lower leg limited to 11/27/2021 No Yes breakdown of skin Inactive Problems Resolved Problems Electronic Signature(s) Signed: 03/22/2022 2:24:38 PM By: Worthy Keeler PA-C Entered By: Worthy Keeler on 03/22/2022 14:24:37 Sippel, [name] ([id number]) -------------------------------------------------------------------------------- Progress Note Details Patient Name: FLORABEL, FAULKS. Date of Service: 03/22/2022 2:30 PM Medical Record Number: [id number] Patient Account Number: [id number] Date of Birth/Sex: 1946/04/28 (76 y.o. F) Treating RN: Donnamarie Poag Primary Care Provider: [name] Other Clinician: Referring Provider: [name] Treating Provider/Extender: [name] in Treatment: 16 Subjective Chief Complaint Information obtained from Patient Right LE Ulcer History of Present Illness (HPI) Pleasant 76 year old with history of chronic venous insufficiency. No diabetes or peripheral vascular disease. Left ABI 1.29. Questionable history of left lower extremity DVT. She developed a recurrent ulceration on her left lateral calf in December 2015, which she attributes to poor diet and subsequent lower extremity edema. She underwent endovenous laser ablation of her left greater saphenous vein in 2010. She underwent laser ablation of accessory branch of left GSV in  April 2016 by Dr. Kellie Simmering at [facility]. She was previously wearing Unna boots, which she tolerated well. Tolerating 2 layer compression and cadexomer iodine. She returns to clinic for follow-up and is without new complaints. She denies any significant pain at this time. She reports persistent pain with pressure. No claudication or ischemic rest pain. No fever or chills. No drainage. READMISSION 11/13/16; this is a 76 year old woman who is not a diabetic. She is here for a review of a painful area on her left medial lower extremity. I note that she was seen here previously last year for wound I believe to be in the same area. At that time she had undergone previously a left greater saphenous vein ablation by Dr. Kellie Simmering and she had a ablation of the anterior accessory branch of the left greater saphenous vein in March 2016. Seeing that the wound actually closed over. In reviewing the history with her today the ulcer in this area has been recurrent. She describes a biopsy of this area in 2009 that only showed stasis physiology. She also has a history of today malignant melanoma in the right shoulder for which she follows with Dr. 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 Do not see that she has ever had arterial studies in the left leg. She tells me she has a follow-up with Dr. Kellie Simmering in  roughly 10 days In any case she developed the reopening of this area roughly a month ago. On the background of this she describes rapidly increasing edema which has responded to Lasix 40 mg and metolazone 2.5 mg as well as the patient's lymph massage. She has been told she has both venous insufficiency and lymphedema but she cannot tolerate compression stockings 11/28/16; the patient saw Dr. Kellie Simmering recently. Per the patient he did arterial Dopplers in the office that did not show evidence of  arterial insufficiency, per the patient he stated "treat this like an ordinary venous ulcer". She also saw her dermatologist Dr. Ronnald Ramp who felt that this was more of a vascular ulcer. In general things are improving although she arrives today with increasing bilateral lower extremity edema with weeping a deeper fluid through the wound on the left medial leg compatible with some degree of lymphedema 12/04/16; the patient's wound is fully epithelialized but I don't think fully healed. We will do another week of depression with Promogran and TCA however I suspect we'll be able to discharge her next week. This is a very unusual-looking wound which was initially a figure-of-eight type wound lying on its side surrounded by petechial like hemorrhage. She has had venous ablation on this side. She apparently does not have an arterial issue per Dr. Kellie Simmering. She saw her dermatologist thought it was "vascular". Patient is definitely going to need ongoing compression and I talked about this with her today she will go to elastic therapy after she leaves here next week 12/11/16; the patient's wound is not completely closed today. She has surrounding scar tissue and in further discussion with the patient it would appear that she had ulcers in this area in 2009 for a prolonged period of time ultimately requiring a punch biopsy of this area that only showed venous insufficiency. I did not previously pickup on this part of the history from the patient. 12/18/16; the patient's wound is completely epithelialized. There is no open area here. She has significant bilateral venous insufficiency with secondary lymphedema to a mild-to-moderate degree she does not have compression stockings.. She did not say anything to me when I was in the room, she told our intake nurse that she was still having pain in this area. This isn't unusual recurrent small open area. She is going to go to elastic therapy to obtain compression  stockings. 12/25/16; the patient's wound is fully epithelialized. There is no open area here. The patient describes some continued episodic discomfort in this area medial left calf. However everything looks fine and healed here. She is been to elastic therapy and caught herself 15-20 mmHg stockings, they apparently were having trouble getting 20-30 mm stockings in her size 01/22/17; this is a patient we discharged from the clinic a month ago. She has a recurrent open wound on her medial left calf. She had 15 mm support stockings. I told her I thought she needed 20-30 mm compression stockings. She tells me that she has been ill with hospitalization secondary to asthma and is been found to have severe hypokalemia likely secondary to a combination of Lasix and metolazone. This morning she noted blistering and leaking fluid on the posterior part of her left leg. She called our intake nurse urgently and we was saw her this afternoon. She has not had any real discomfort here. I don't know that she's been wearing any stockings on this leg for at least 2-3 days. ABIs in this clinic were 1.21 on the right and  1.3 on the left. She is previously seen vascular surgery who does not think that there is a peripheral arterial issue. 01/30/17; Patient arrives with no open wound on the left leg. She has been to elastic therapy and obtained 20-55mhg below knee stockings and she has one on the right leg today. READMISSION 02/19/18; this PPizanais a now 76year old patient we've had in this clinic perhaps 3 times before. I had last looked at her from January 07 December 2016 with an area on the medial left leg. We discharged her on 12/25/16 however she had to be readmitted on 01/22/17 with a recurrence. I have in my notes that we discharged her on 20-30 mm stockings although she tells me she was only wearing support hose because she cannot get stockings on predominantly related to her cervical spine surgery/issues. [place]  [place] the left with an anterior accessory branch ablation I think both of these were in 2016. On one of the previous visit she had a biopsy noted 2009 that was negative. She is not felt to have an arterial issue. She is not a diabetic. She does have a history of obstructive sleep apnea hypertension asthma as well as chronic venous insufficiency and lymphedema. PTORRY, ADAMCZAK([id number] On this occasion she noted 2 dry scaly patch on her left leg. She tried to put lotion on this it didn't really help. There were 2 open areas.the patient has been seeing her primary physician from 02/05/18 through 02/14/18. She had Unna boots applied. The superior wound now on the lateral left leg has closed but she's had one wound that remains open on the lateral left leg. This is not the same spot as we dealt with in 2018. ABIs in this clinic were 1.3 bilaterally 02/26/18; patient has a small wound on the left lateral calf. Dimensions are down. She has chronic venous insufficiency and lymphedema. 03/05/18; small open area on the left lateral calf. Dimensions are down. Tightly adherent necrotic debris over the surface of the wound which was difficult to remove. Also the dressing [over collagen] stuck to the wound surface. This was removed with some difficulty as well. Change the primary dressing to Hydrofera Blue ready 03/12/18; small open area on the left lateral calf. Comes in with tightly adherent surface eschar as well as some adherent Hydrofera Blue. 03/19/18; open area on the left lateral calf. Again adherent surface eschar as well as some adherent Hydrofera Blue nonviable subcutaneous tissue. She complained of pain all week even with the reduction from 4-3 layer compression I put on last week. Also she had an increase in her ankle and calf measurements probably related to the same thing. 03/26/18; open area on the left  lateral calf. A very small open area remains here. We used silver alginate starting last week as the Hydrofera Blue seem to stick to the wound bed. In using 4-layer compression 04/02/18; the open area in the left lateral calf at some adherent slough which I removed there is no open area here. We are able to transition her into her own compression stocking. Truthfully I think this is probably his support hose. However this does not maintain skin integrity will be limited. She cannot put over the toe compression stockings on because of neck problems hand problems etc. She is allergic to the lining layer of juxta lites. We might be forced to use extremitease stocking should this fail  READMIT 11/24/2018 Patient is now a 76 year old woman who is not a diabetic. She has been in this clinic on at least 3 previous occasions largely with recurrent wounds on her left leg secondary to chronic venous insufficiency with secondary lymphedema. Her situation is complicated by inability to get stockings on and an allergy to neoprene which is apparently a component and at least juxta lites and other stockings. As a result she really has not been wearing any stockings on her legs. She tells [place] that roughly 2 or 3 weeks ago she started noticing a stinging sensation just above her ankle on the left medial aspect. She has been diagnosed with pseudogout and she wondered whether this was what she was experiencing. She tried to dress this with something she bought at the store however subsequently it pulled skin off and now she has an open wound that is not improving. She has been using Vaseline gauze with a cover bandage. She saw her primary doctor last week who put an Haematologist on her. ABIs in this clinic was 1.03 on the left 2/12; the area is on the left medial ankle. Odd-looking wound with what looks to be surface epithelialization but a multitude of small petechial openings. This clearly not closed yet. We have been  using silver alginate under 3 layer compression with TCA 2/19; the wound area did not look quite as good this week. Necrotic debris over the majority of the wound surface which required debridement. She continues to have a multitude of what looked to be small petechial openings. She reminds [place] that she had a biopsy on this initially during her first outbreak in 2015 in [facility]. She expresses concern about this being a possible melanoma. 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 I assured her that this does not look anything like melanoma. Except for the petechial reaction it does look like a venous insufficiency area and she certainly has evidence of this on both sides 2/26; a difficult area on the left medial ankle. The patient clearly has chronic venous hypertension with some degree of lymphedema. The odd thing about the area is the small petechial hemorrhages. I am not really sure how to explain this. This was present last time and this is not a compression injury. We have been using Hydrofera Blue which I changed to last week 3/4; still using Hydrofera Blue. Aggressive debridement today. She does not have known arterial issues. She has seen Dr. Kellie Simmering at [facility] vein and vascular and and has an ablation on the left. [Anterior accessory branch of the greater saphenous]. From what I remember they did not feel she had an arterial issue. The patient has had this area biopsied in 2009 at [facility] dermatology and by her recollection they said this was "stasis". She is also follow-up with dermatology locally who thought that this was more of a vascular issue 3/11; using Hydrofera Blue. Aggressive debridement today. She does not have an arterial issue. We are using 3 layer compression although we may need to go to 4. The patient has been in for multiple changes to her wrap since I last saw her a  week ago. She says that the area was leaking. I do not have too much more information on what was found 01/19/19 on evaluation today patient was actually being seen for a nurse visit when unfortunately she had the area on her left lateral lower extremity as  well as weeping from the right lower extremity that became apparent. Therefore we did end up actually seeing her for a full visit with myself. She is having some pain at this site as well but fortunately nothing too significant at this point. No fevers, chills, nausea, or vomiting noted at this time. 3/18-Patient is back to the clinic with the left leg venous leg ulcer, the ulcer is larger in size, has a surface that is densely adherent with fibrinous tissue, the Hydrofera Blue was used but is densely adherent and there was difficulty in removing it. The right lower extremity was also wrapped for weeping edema. Patient has a new area over the left lateral foot above the malleolus that is small and appears to have no debris with intact surrounding skin. Patient is on increased dose of Lasix also as a means to edema management 3/25; the patient has a nonhealing venous ulcer on the medial left leg and last week developed a smaller area on the lateral left calf. We have been using Hydrofera Blue with a contact layer. 4/1; no major change in these wounds areas. Left medial and more recently left lateral calf. I tried Iodoflex last week to aid in debridement she did not tolerate this. She stated her pain was terrible all week. She took the top layer of the 4 layer compression off. 4/8; the patient actually looks somewhat better in terms of her more prominent left lateral calf wound. There is some healthy looking tissue here. She is still complaining of a lot of discomfort. 4/15; patient in a lot of pain secondary to sciatica. She is on a prednisone taper prescribed by her primary physician. She has the 2 areas one on the left medial and more recently a  smaller area on the left lateral calf. Both of these just above the malleoli 4/22; her back pain is better but she still states she is very uncomfortable and now feels she is intolerant to the [employer]. No real change in the wounds we have been using Sorbact. She has been previously intolerant to Iodoflex. There is not a lot of option about what we can use to debride this wound under compression that she no doubt needs. sHe states Ultram no longer works for her pain 4/29; no major change in the wounds slightly increased depth. Surface on the original medial wound perhaps somewhat improved however the more recent area on the lateral left ankle is 100% covered in very adherent debris we have been using Sorbact. She tolerates 4 layer compression well and her edema control is a lot better. She has not had to come in for a nurse check 5/6; no major change in the condition of the wounds. She did consent to debridement today which was done with some difficulty. Continuing Sorbact. She did not tolerate Iodoflex. She was in for a check of her compression the day after we wrapped her last week this was adjusted but nothing much was found 5/13; no major change in the condition or area of the wounds. I was able to get a fairly aggressive debridement done on the lateral left leg Hlavaty, Hadar ([id number]) wound. Even using Sorbact under compression. She came back in on Friday to have the wrap changed. She says she felt uncomfortable on the lateral aspect of her ankle. She has a long history of chronic venous insufficiency including previous ablation surgery on this side. 5/20-Patient returns for wounds on left leg with both wounds covered in slough, with the  lateral leg wound larger in size, she has been in 3 layer compression and felt more comfortable, she describes pain in ankle, in leg and pins and needles in foot, and is about to try Pamelor for this 6/3; wounds on the left lateral and left medial leg. The  area medially which is the most recent of the 2 seems to have had the largest increase in dimensions. We have been using Sorbac to try and debride the surface. She has been to see orthopedics they apparently did a plain x-ray that was indeterminant. Diagnosed her with neuropathy and they have ordered an MRI to determine if there is underlying osteomyelitis. This was not high on my thought list but I suppose it is prudent. We have advised her to make an appointment with vein and vascular in [place]. She has a history of a left greater saphenous and accessory vein ablations I wonder if there is anything else that can be done from a surgical point of view to help in these difficult refractory wounds. We have previously healed this wound on one occasion but it keeps on reopening [medial side] 6/10; deep tissue culture I did last week I think on the left medial wound showed both moderate E. coli and moderate staph aureus [MSSA]. She is going to require antibiotics and I have chosen Augmentin. We have been using Sorbact and we have made better looking wound surface on both sides but certainly no improvement in wound area. She was back in last Friday apparently for a dressing changes the wrap was hurting her outer left ankle. She has not managed to get a hold of vein and vascular in [place]. We are going to have to make her that appointment 6/17; patient is tolerating the Augmentin. She had an MRI that I think was ordered by orthopedic surgeon this did not show osteomyelitis or an abscess did suggest cellulitis. We have been using Sorbact to the lateral and medial ankles. We have been trying to arrange a follow-up appointment with vein and vascular in [place] or did her original ablations. We apparently an area sent the request to vein and vascular in [facility] 6/24; patient has completed the Augmentin. We do not yet have a vein and vascular appointment in [place]. I am not sure what the issue  is here we have asked her to call tomorrow. We are using Sorbact. Making some improvements and especially the medial wound. Both surfaces however look better medial and lateral. 7/1; the patient has been in contact with vein and vascular in [place] but has not yet received an appointment. Using Sorbact we have gradually improve the wound surface with no improvement in surface area. She is approved for Apligraf but the wound surface still is not completely viable. She has not had to come in for a dressing change 7/8; the patient has an appointment with vein and vascular on 7/31 which is a Friday afternoon. She is concerned about getting back here for [place] to dress her wounds. I think it is important to have them goal for her venous reflux/history of ablations etc. to see if anything else can be done. She apparently tested positive for 1 of the blood tests with regards to lupus and saw a rheumatologist. He has raised the issue of vasculitis again. I have had this thought in the past however the evidence seems overwhelming that this is a venous reflux etiology. If the rheumatologist tells me there is clinical and laboratory investigation is positive for lupus I will  rethink this. 7/15; the patient's wound surfaces are quite a bit better. The medial area which was her original wound now has no depth although the lateral wound which was the more recent area actually appears larger. Both with viable surfaces which is indeed better. Using Sorbact. I wanted to use Apligraf on her however there is the issue of the vein and vascular appointment on 7/31 at 2:00 in the afternoon which would not allow her to get back to be rewrapped and they would no doubt remove the graft 7/22; the patient's wound surfaces have moderate amount of debris although generally look better. The lateral one is larger with 2 small satellite areas superiorly. We are waiting for her vein and vascular appointment on 7/31. She has been  approved for Apligraf which I would like to use after th 7/29; wound surfaces have improved no debridement is required we have been using Sorbact. She sees vein and vascular on Friday with this so question of whether anything can be done to lessen the likelihood of recurrence and/or speed the healing of these areas. She is already had previous ablations. She no doubt has severe venous hypertension 8/5-Patient returns at 1 week, she was in [place] for 3 days by her podiatrist, we have been using so backed to the wound, she has increased pain in both the wounds on the left lower leg especially the more distal one on the lateral aspect 8/12-Patient returns at 1 week and she is agreeable to having debridement in both wounds on her left leg today. We have been using Sorbact, and vascular studies were reviewed at last visit 8/19; the patient arrives with her wounds fairly clean and no debridement is required. We have used Sorbact which is really done a nice job in cleaning up these very difficult wound surfaces. The patient saw Dr. Donzetta Matters of vascular surgery on 7/31. He did not feel that there was an arterial component. He felt that her treated greater saphenous vein is adequately addressed and that the small saphenous vein did not appear to be involved significantly. She was also noted to have deep venous reflux which is not treatable. Dr. Donzetta Matters mentioned the possibility of a central obstructive component leading to reflux and he offered her central venography. She wanted to discuss this or think about it. I have urged her to go ahead with this. She has had recurrent difficult wounds in these areas which do heal but after months in the clinic. If there is anything that can be done to reduce the likelihood of this I think it is worth it. 9/2 she is still working towards getting follow-up with Dr. Donzetta Matters to schedule her CT. Things are quite a bit worse venography. I put Apligraf on 2 weeks ago on both wounds  on the medial and lateral part of her left lower leg. She arrives in clinic today with 3 superficial additional wounds above the area laterally and one below the wound medially. She describes a lot of discomfort. I think these are probably wrapped injuries. Does not look like she has cellulitis. 07/20/2019 on evaluation today patient appears to be doing somewhat poorly in regard to her lower extremity ulcers. She in fact showed signs of erythema in fact we may even be dealing with an infection at this time. Unfortunately I am unsure if this is just infection or if indeed there may be some allergic reaction that occurred as a result of the Apligraf application. With that being said that would be  unusual but nonetheless not impossible in this patient is one who is unfortunately allergic to quite a bit. Currently we have been using the Sorbact which seems to do as well as anything for her. I do think we may want to obtain a culture today to see if there is anything showing up there that may need to be addressed. 9/16; noted that last week the wounds look worse in 1 week follow-up of the Apligraf. Using Sorbact as of 2 days ago. She arrives with copious amounts of drainage and new skin breakdown on the back of the left calf. The wounds arm more substantial bilaterally. There is a fair amount of swelling in the left calf no overt DVT there is edema present I think in the left greater than right thigh. She is supposed to go on 9/28 for CT venography. The wounds on the medial and lateral calf are worse and she has new skin breakdown posteriorly at least new for me. This is almost developing into a circumferential wound area The Apligraf was taken off last week which I agree with things are not going in the right direction a culture was done we do not have that back yet. She is on Augmentin that she started 2 days ago 9/23; dressing was changed by her nurses on Monday. In general there is no improvement in the  wound areas although the area looks less angry than last week. She did get Augmentin for MSSA cultured on the 14th. She still appears to have too much swelling in the left leg even with 3 layer compression 9/30; the patient underwent her procedure on 9/28 by Dr. Donzetta Matters at vascular and vein specialist. She was discovered to have the common iliac vein measuring 12.2 mm but at the level of L4-L5 measured 3 mm. After stenting it measured 10 mm. 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 Rouleaux flow in the common femoral and femoral vein was observed much improved after stenting. We are using silver alginate to the wounds on the medial and lateral ankle on the left. 4 layer compression 10/7; the patient had fluid swelling around her knee and 4 layer compression. At the advice of vein and vascular this was reduced to 3 layer which she is tolerating better. We have been using silver alginate under 3 layer compression since last Friday ELYNA, PANGILINAN ([id number]) 10/14; arrives with the areas on the left ankle looking a lot better. Inflammation in the area also a lot better. She came in for a nurse check on 10/9 10/21; continued nice improvement. Slight improvements in surface area of both the medial and lateral wounds on the left. A lot of the satellite lesions in the weeping erythema around these from stasis dermatitis is resolved. We have been using silver alginate 10/28; general improvement in the entire wound areas although not a lot of change in dimensions the wound certainly looks better. There is a lot less in terms of venous inflammation. Continue silver alginate this week however look towards Hydrofera Blue next week 11/4; very adherent debris on the medial wound left wound is not as bad. We have been using silver alginate. Change to [facility] today 11/11; very adherent debris on both wound areas. She went to vein and vascular last week and follow-up they put in [place] boot on  this today. He says the [facility] was adherent. Wound is definitely not as good as last week. Especially on the left there the satellite lesions look  more prominent 11/18; absolutely no better. erythema on lateral aspect with tenderness. 09/30/2019 on evaluation today patient appears to actually be doing better. Dr. Dellia Nims did put her on doxycycline last week which I do believe has helped her at this point. Fortunately there is no signs of active infection at this time. No fevers, chills, nausea, vomiting, or diarrhea. I do believe he may want extend the doxycycline for 7 additional days just to ensure everything does completely cleared up the patient is in agreement with that plan. Otherwise she is going require some sharp debridement today 12/2; patient is completing a 2-week course of doxycycline. I gave her this empirically for inflammation as well as infection when I last saw her 2 weeks ago. All of this seems to be better. She is using silver alginate she has the area on the medial aspect of the larger area laterally and the 2 small satellite regions laterally above the major wound. 12/9; the patient's wound on the left medial and left lateral calf look really quite good. We have been using silver alginate. She saw vein and vascular in follow-up on 10/09/2019. She has had a previous left greater saphenous vein ablation by Dr. Oscar La in 2016. More recently she underwent a left common iliac vein stent by Dr. 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 The swelling is improved and certainly the wounds have improved. The patient shows [place] today area on the right medial calf there is almost no wound but leaking lymphedema. She says she start this started 3 or 4 days ago. She did not traumatize it. It is not painful. She does not wear compression on that side 12/16; the patient continues to do well laterally. Medially still requiring debridement. The area on the right calf did not  materialize to anything and is not currently open. We wrapped this last time. She has support stockings for that leg although I am not sure they are going to provide adequate compression 12/23; the lateral wound looks stable. Medially still requiring debridement for tightly adherent fibrinous debris. We've been using silver alginate. Surface area not any different 12/30; neither wound is any better with regards to surface and the area on the left lateral is larger. I been using silver alginate to the left lateral which look quite good last week and Sorbact to the left medial 11/11/2019. Lateral wound area actually looks better and somewhat smaller. Medial still requires a very aggressive debridement today. We have been using Sorbact on both wound areas 1/13; not much better still adherent debris bilaterally. I been using Sorbact. She has severe venous hypertension. Probably some degree of dermal fibrosis distally. I wonder whether tighter compression might help and I am going to try that today. We also need to work on the bioburden 1/20; using Sorbact. She has severe venous hypertension status post stent placement for pelvic vein compression. We applied gentamicin last time to see if we could reduce bioburden I had some discussion with her today about the use of pentoxifylline. This is occasionally used in this setting for wounds with refractory venous insufficiency. However this interacts with Plavix. She tells me that she was put on this after stent placement for 3 months. She will call Dr. Claretha Cooper office to discuss 1/27; we are using gentamicin under Sorbact. 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 She has a stent. Wound medially is measuring smaller this week. Laterally measuring slightly larger although she has some satellite lesions superiorly 2/3; gentamicin under  Sorbact under 4-layer compression. 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 She has a stent on Plavix. Her wounds are measuring smaller this week. More substantially laterally where there is a satellite lesion superiorly. 2/10; gentamicin under Sorbac. 4-layer compression. Patient communicated with Dr. Donzetta Matters at vein and vascular in [place]. He is okay with the patient coming off Plavix I will therefore start her on pentoxifylline for a 1 month trial. In general her wounds look better today. I had some concerns about swelling in the left thigh however she measures 61.5 on the right and 63 on the mid thigh which does not suggest there is any difficulty. The patient is not describing any pain. 2/17; gentamicin under Sorbac 4-layer compression. She has been on pentoxifylline for 1 week and complains of loose stool. No nausea she is eating and drinking well 2/24; the patient apparently came in 2 days ago for a nurse visit when her wrap fell down. Both areas look a little worse this week macerated medially and satellite lesions laterally. Change to silver alginate today 3/3; wounds are larger today especially medially. She also has more swelling in her foot lower leg and I even noted some swelling in her posterior thigh which is tender. I wonder about the patency of her stent. Fortuitously she sees Dr. Claretha Cooper group on Friday 3/10; Mrs. Larrivee was seen by vein and vascular on 3/5. The patient underwent ultrasound. There was no evidence of thrombosis involving the IVC no evidence of thrombosis involving the right common iliac vein there is no evidence of thrombosis involving the right external iliac vein the left external vein is also patent. The right common iliac vein stent appears patent bilateral common femoral veins are compressible and appear patent. I was concerned about the left common iliac stent however it looks like this is functional. She has some edema in the posterior thigh that was tender she still has that this week. I also note they had trouble  finding the pulses in her left foot and booked her for an ABI baseline in 4 weeks. She will follow up in 6 months for repeat IVC duplex. The patient stopped the pentoxifylline because of diarrhea. It does not look like that was being effective in any case. I have advised her to go back on her aspirin 81 mg tablet, vascular it also suggested this 3/17; comes in today with her wound surfaces a lot better. The excoriations from last week considerably better probably secondary to the TCA. We have been using silver alginate 3/24; comes in today with smaller wounds both medially and laterally. Both required debridement. There are 2 small satellite areas superiorly laterally. She also has a very odd bandlike area in the mid calf almost looking like there was a weakness in the wrap in a localized area. I would write this off as being this however anteriorly she has a small raised ballotable area that is very tender almost reminiscent of an abscess but there was no obvious purulent surface to it. DANYETTA, GILLHAM ([id number]) 02/04/20 upon evaluation today patient appears to be doing fairly well in regard to her wounds today. Fortunately there is no signs of active infection at this time. No fevers, chills, nausea, vomiting, or diarrhea. She has been tolerating the dressing changes without complication. Fortunately I feel like she is showing signs of improvement although has been sometime since have seen her. Nonetheless the area of concern that Dr. Dellia Nims had last week where she had possibly an  area of the wrap that was we can allow the leg to bulge appears to be doing significantly better today there is no signs of anything worsening. 4/7; the patient's wounds on her medial and lateral left leg continue to contract. We have been using a regular alginate. Last week she developed an area on the right medial lower leg which is probably a venous ulcer as well. 4/14; the wounds on her left medial and lateral  lower leg continue to contract. Surface eschar. We have been using regular alginate. The area on the right medial lower leg is closed. We have been putting both legs under 4-layer contraction. The patient went back to see vein and vascular she had arterial studies done which were apparently "quite good" per the patient although I have not read their notes I have never felt she had an arterial issue. 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 This is been longstanding and refractory to exercise, leg elevation and longstanding use of compression wraps in our clinic as well as compression stockings on the times we have been able to get these to heal 4/21; we thought she actually might be close this week however she arrives in clinic with a lot of edema in her upper left calf and into her posterior thigh. This is been an intermittent problem here. She says the wrap fell down but it was replaced with a nurse visit on Monday. We are using calcium alginate to the wounds and the wound sizes there not terribly larger than last week but there is a lot more edema 4/28; again wound edges are smaller on both sides. Her edema is better controlled than last time. She is obtained her compression pumps from [employer] although they have not been to her home to set these up. 5/5; left medial and left lateral both look stable. I am not sure the medial is any smaller. We have been using calcium alginate under 4-layer compression. She had an area on the right medial. This was eschared today. We have been wrapping this as well. She does not tolerate external compression stockings due to a history of various contact allergies. She has her compression pumps however the representative from the company is coming on her to show her how to use these tomorrow 5/19; patient with severe chronic venous insufficiency secondary to central venous disease. She had a stent placed in  her left common iliac vein. She has done better since but still difficult to control wounds. She comes in today with nothing open on the right leg. Her areas on the left medial and left lateral are just about closed. We are using calcium alginate under 4-layer compression. She is using her external compression pumps at home She only has 15-20 support stockings. States she cannot get anything tighter than that on. 03/30/20-Patient returns at 1 week, the wounds on the left leg are both slightly bigger, the last week she was on 3 layer compression which started to slide down. She is starting to use her lymphedema pumps although she stated on 1 day her right ankle started to swell up and she have to stop that day. Unfortunately the open area seem to oscillate between improving to the point of healing and then flaring up all to do with effectiveness of compression or lack of due to the left leg topography not keeping the compression wraps from rolling down 6/2 patient comes in with a 15/20 mmHg stocking on the right leg. She  tells me that she developed a lot of swelling in her ankles she saw orthopedics she was felt to possibly be having a flare of pseudogout versus some other type of arthritis. She was put on steroids for a respiratory issue so that helps with the inflammation. She has not been using the pumps all week. She thinks the left thigh is more swollen than usual and I would agree with that. She has an appointment with Dr. Donzetta Matters 9 days or so from now 6/9; both wounds on the left medial and left lateral are smaller. We have been using calcium alginate under compression. She does not have an open wound on the right leg she is using a stocking and her compression pumps things are going well. She has an appointment with Dr. Donzetta Matters with regards to her stent in the left common iliac vein 6/16; the wounds on the left medial and left lateral ankle continues to contract. The patient saw Dr. Donzetta Matters and I think  he seems satisfied. Ordered follow-up venous reflux studies on both sides in September. Cautioned that she may need thigh-high stockings. She has been using calcium alginate under compression on the left and her own stocking on the right leg. She tells [place] there are no open wounds on the right 6/23; left lateral is just about closed. Medial required debridement today. We have been using calcium alginate. Extensive discussion about the compression pumps she is only using these on 25 mmHg states she could not take 40 or 30 when the wrap came out to her home to demonstrate these. He said they should not feel tight 6/30; the left lateral wound has a slight amount of eschar. . The area medially is about the same using Hydrofera Blue. 7/7; left lateral wound still has some eschar. I will remove this next week may be closed. The area medially is very small using Hydrofera Blue with improvement. Unfortunately the stockings fell down. Unfortunately the blisters have developed at the edge of where the wrap fell. When this happened she says her legs hurt she did not use her pumps. We are not open Monday for her to come in and change the wraps and she had an appointment yesterday. She also tells me that she is going to have an MRI of her back. She is having pain radiating into her left anterior leg she thinks her from an L5 disc. She saw Dr. Ellene Route of neurosurgery 7/14; the area on the left lateral ankle area is closed. Still a small area medially however it looks better as well. We have been using Hydrofera Blue under 4-layer compression 7/21; left lateral ankle is still closed however her wound on the medial left calf is actually larger. This is probably because Hydrofera Blue got stuck to the wound. She came in for a nurse change on Friday and will do that again this week I was concerned about the amount of swelling that she had last week however she is using her compression pumps twice a day and the swelling  seems well controlled 7/28; remaining wound on the left medial lower leg is smaller. We have been using moistened silver collagen under compression she is coming back for a nurse visit. For reasons that were not really clear she was just keeping her legs elevated and not using her compression pumps. I have asked her to use the compression pumps. She does not have any wounds on the right leg 06/15/20-Patient returns at 2 weeks, her LLE edema is worse  and she developed a blister wound that is new and has bigger posterior calf wound on right, we are using Prisma with pad, 4 layer compression. she has been on lasix 40 mg daily 8/18; patient arrives today with things a lot worse than I remember from a few weeks ago. She was seen last week. Noted that her edema was worse and that she now had a left lateral wound as well as deteriorating edema in the medial and posterior part of the lower leg. She says she is using his or her external compression pumps once a day although I wonder about the compliance. 8/25; weeping area on the right medial lower leg. This had actually gotten a small localized area of her compression stocking wet. On the left side there is a large denuded area on the posterior medial lower leg and smaller area on the lateral. This was not the original areas that we dealt with. 9/1 the patient's wound on the left leg include the left lateral and left posterior. Larger superficial wounds weeping. She has very poor edema control. Tender localized edema in the left lower medial ankle/heel probably because of localized wrap issues. She freely admits she is not using the compression pumps. She has been up on her feet a lot. She thinks the hydrofera blue is contributing to the pain she is experiencing.. This is a complaint that I have occasionally heard PERINA, SALVAGGIO ([id number]) 9/8; really not much improvement. The patient is still complaining of a lot of pain particularly when she uses  compression pumps. I switched her to silver alginate last time because she found the Hydrofera Blue to be irritating. I don't hear much difference in her description with the silver alginate. 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 She has follow-up with Dr. Donzetta Matters I think for ultrasound next month 9/15; quite a bit of improvement today. We have less edema and more epithelialization in both of her wound areas on the left medial and left lateral calf. These are not the site of her original wounds in this area. She says she has been using her compression pumps for 30 minutes twice a day, there pain issues that never quite understood. Silver alginate as the primary dressing 9/22; continued improvement. Both areas medially and laterally still have a small open area there is some eschar. She continues to complain of left medial ankle pain. Swelling in the leg is in much better condition. We have been using silver alginate 9/29; continued improvement. Both areas medially and laterally in the left calf look as though they are close some minor surface eschar but I think this is epithelialized. She comes in today saying she has a ruptured disc at L4-L5 cannot bend over to put on her stockings. 10/6; patient comes in today with no open wounds on either leg. However her edema on the left leg in the upper one third of the lower leg is poorly controlled nonpitting. She says that she could not use the pumps for 2 days and then she has been using the last couple of days. It is not clear to me she has been able to get her stocking on. She has back problems. Mrs. Hosman has severe chronic venous insufficiency with secondary lymphedema. Her venous insufficiency is partially centrally mediated and that she is now post stent in the left common iliac vein. [facility] Thurner's syndrome/physiology]. She follows up with  them on 10/15. She wears  20/30 below-knee stockings. She is supposed to use compression pumps at home although I think her compliance about with this is been less than 100%. I have asked her to use these 3 times a day. Finally I think she has lipodermatosclerosis in the left lower leg with an inverted bottle sign. It is been a major problem controlling the edema in the left leg. The right leg we have had wounds on but not as significant a problem is on the left READMISSION 04/12/2021 Mrs. Furgerson is a 76 year old woman we know well in this clinic. She has severe chronic venous insufficiency. 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 I believe she has had bilateral greater saphenous vein ablation in the past as well. She tells me that this wound opened sometime in March. She had a fall and thinks it was initially abrasion. She developed areas she describes as little blisters on the anterior part of her leg and she saw dermatology and was treated for methicillin staph aureus with several rounds of antibiotics. She has been using support stockings on the left leg and says this is the only thing she can get on. Her compression pump use maybe once a day she says if she did not use one she use the other. She comes in today with incredible swelling in the left leg with a wound on the left posterior calf. She has been using Neosporin to this previously a hydrocolloid. 6/15; patient arrives back for 1 week follow-up.Marland Kitchen Apparently her wrap fell down she did not call us to replace this. He has poor edema control. She only uses her compression pumps once a day 6/29; patient presents for 1 week follow-up. She has tolerated the compression wrap well. We have been using Iodoflex under the wrap. She has no issues or complaints today. 7/6; patient presents for 1 week follow-up. She states that the compression wrap rolled down her leg 4 days ago. She has been trying to keep the area covered but has no  dressings at home to use. She denies signs of infection. 7/13; patient presents for 1 week follow-up. She states that her compression wrap rolled down her right leg and she called our office and had it placed a few days ago. She has been tolerating the current wrap well. She states that the Iodoflex is causing a burning sensation. She denies signs of infection. 7/27; patient presents for 1 week follow-up She has tolerated the compression wrap well with Hydrofera Blue underneath. She has now developed a wound to her right lower extremity. She reports having a culture done To this area by her dermatologist that she reports is negative. She currently denies signs of infection. 8/30; patient presents for 1 week follow-up. On the left side she has tolerated the compression wrap well with Hydrofera Blue underneath. She reports some discomfort to her right lower extremity with the 3 layer compression. She currently denies signs of infection. 06/14/2021 upon evaluation today patient's wound actually appears to be doing decently well based on what I am seeing currently. She actually has 2 areas 1 on the left distal/posterior lower leg and the other on the right lower leg. Subsequently the measurements are roughly the same may be slightly smaller but in general have not made any trend towards getting worse which is great news. 06/23/2021 upon evaluation today patient appears to be doing pretty well in regard to her wounds. On the  right she is having difficulty with sciatic pain and this subsequently has led to her taking the wrap off over the last week her leg is more swollen she is also been on prednisone for 10 days. With that being said I think we may want to just use a compression sock on the left that way she will not have to worry about the compression wrap being in place that she cannot get off. With that being said I do believe as well that the patient is going to need to continue with the wrapping on  the left we will also need to do a little bit of sharp debridement today. 8/24; patient presents for 1 week follow-up. She has no issues or complaints today. She denies signs of infection. 8/31; patient presents for 1 week follow-up. She has no issues or complaints today. She has tolerated the compression wrap well on the left lower extremity. She denies signs of infection. 9/7; patient presents for follow-up. She reports that the compression wrap rolled down slightly on her right lower extremity. She reports tenderness to the wound bed. She denies increased warmth or erythema to the surrounding skin. She overall feels well. 9/14; patient presents for follow-up. She has no issues or complaints today. She denies signs of infection. PuraPly is available and patient would like to start this today. 9/21; patient presents for follow-up. She has no issues or complaints today. She tolerated the first skin substitute placement last week under compression. 9/28; patient presents for 1 week follow-up. She has no issues or complaints today. 10/5; patient presents for 1 week follow-up. She reports taking the wrap off yesterday. She was on her feet more yesterday and developed more swelling to her left lower extremity. She denies pain. Overall she is doing well. [name], GOHEEN ([id number]) 10/12; patient presents for 1 week follow-up. She has no issues or complaints today. 10/19; patient presents for follow-up. She has no issues or complaints today. She tolerated the compression wrap well. She states she has compression stockings at home. Readmission: 11/27/2021 upon evaluation today patient appears to be doing poorly in regard to the wounds on her right leg. She also has an area of erythema in the proximal calf area of the right leg which again I do believe is a issue from the standpoint of it being warm and erythematous to touch and also seems to be somewhat localized I really think this may be more of a  cellulitis issue than anything else. Fortunately I do not see any signs of active infection Systemically at this time. 12/05/2021 upon evaluation patient's wounds actually appear to be showing some signs of improvement which is not too bad and just 1 week's time. Especially since we had to get on the right antibiotic which is only been for the past 4 days. Fortunately I do not see any signs of active infection locally nor systemically at this time that has gotten any worse. Overall I think that again with the antibiotic she is significantly improved and currently she is taking Levaquin. 12/11/2021 upon evaluation today patient appears to be doing well with regard to her leg. Between the antibiotics and the triamcinolone I feel like she is doing significantly better which is great news. Overall I am extremely pleased with where we stand. There does not appear to be any signs of active infection locally or systemically at this time. 12/18/2021 upon evaluation today patient appears to be doing well with regard to her wound. I do feel  like this is showing signs of some improvement here. Little by little this is clearing up quite nicely. I think it is just a slow process but nonetheless 1 that we are seeing evidence of improvement with regard to. 12/25/2021 upon evaluation today patient appears to be doing well with regard to her wounds she is definitely making some good progress here. Fortunately I do not see any signs of active infection locally or systemically at this time which is great news. No fevers, chills, nausea, vomiting, or diarrhea. 01/02/2022 upon evaluation today patient appears to be doing well with regard to her wound. She has been tolerating the dressing changes without complication. Fortunately I do not see any evidence of active infection locally or systemically which is great news and overall I am extremely pleased with where we stand today. 01/08/2022 upon evaluation today patient appears  to be doing better in regard to her ankle ulcers. She has been tolerating the dressing changes without complication. Fortunately I do not see any signs of infection and both the ankle and leg area is making great progress. 3/13; patient presents for follow-up. She has no issues or complaints today. She was tolerated the compression wrap well. 01/19/2022 upon evaluation today patient appears to be doing well currently in regard to her right leg which overall I think is doing excellent. She had called in to try to get her in sooner for a dressing change due to an issue where her cat scratched her on the left leg. With that being said it does not appear that this was actually a scratch but rather that it was a puncture over a small varicose vein. She had a tremendous amount of bleeding as evidenced by the amount of blood in her shoes currently. The good news is this is stopped currently the bad news is that she needs to be very careful with regard to her cats later as this could definitely be a significant problem for her. Fortunately I do not see any signs of active infection locally nor systemically at this point. 02-08-2022 on evaluation today patient actually appears to be doing significantly better. She was in the hospital due to having stepped on attack on her left heel but nonetheless this is actually showing signs of improvement at this point. I do not see any evidence of active infection locally or systemically which is great news and overall I am extremely pleased with where we stand today. Her right leg is showing no signs of infection. She has been on antibiotics while in the hospital and at discharge. 02-15-2022 upon evaluation today patient appears to be doing better in regard to her wounds. Overall I feel like we are headed in the right direction. With that being said she does have an area of irritation posteriorly that was not there last week again that makes this wound posterior look little  bigger but overall things are significantly better. I am very pleased in that regard. 02-23-2022 upon evaluation today patient appears to be doing well with regard to her wound. It is doing a little bit worse only and the fact that it is dry other than that things seem to be making good improvements here. There does not appear to be any signs of active infection at this time which is great news. 4/27; right lateral leg 4-5 small open areas with eschar on top. I used a curette to remove this. She has been using Xeroform under 3 layer compression. 03-08-2022 upon evaluation today patient appears  to be doing well with regard to the wounds on her lower extremity. She has been tolerating the dressing changes without complication. Fortunately there does not appear to be any signs of active infection at this time which is great news. No fevers, chills, nausea, vomiting, or diarrhea. 03-15-2022 upon evaluation today patient appears to be doing well currently in regard to her wounds. She has been tolerating the dressing changes without complication. Fortunately there does not appear to be any evidence of active infection locally nor systemically at this time which is great news. I do believe she is getting very close to resolution. 03-22-2022 upon evaluation today patient actually appears to be doing awesome in fact she is has 1 very tiny area immediately left open the rest of this appears to be almost completely closed. I do not see any evidence of infection locally or systemically which is great news and overall I am extremely pleased with where things stand at this point. I do think we are getting very close to complete resolution. KAYSEN, SEFCIK ([id number]) Objective Constitutional [name] and well-hydrated in no acute distress. Vitals Time Taken: 2:30 PM, Height: 62 in, Weight: 199 lbs, BMI: 36.4, Temperature: 97.6 F, Pulse: 67 bpm, Respiratory Rate: 16 breaths/min, Blood Pressure: 145/69  mmHg. Respiratory normal breathing without difficulty. Psychiatric this patient is able to make decisions and demonstrates good insight into disease process. Alert and Oriented x 3. pleasant and cooperative. General Notes: Upon inspection patient's wounds actually are all healed except for just a very tiny area on the medial aspect of her right ankle region. This is still pretty tender to touch nonetheless. She overall though does seem to be doing a lot better. I think were very close to complete resolution. Integumentary (Hair, Skin) Wound #15 status is Healed - Epithelialized. Original cause of wound was Gradually Appeared. The date acquired was: 10/27/2021. The wound has been in treatment 16 weeks. The wound is located on the Right,Posterior Lower Leg. The wound measures 0cm length x 0cm width x 0cm depth; 0cm^2 area and 0cm^3 volume. There is no tunneling or undermining noted. There is a medium amount of serosanguineous drainage noted. The wound margin is indistinct and nonvisible. There is no granulation within the wound bed. There is no necrotic tissue within the wound bed. Wound #16 status is Open. Original cause of wound was Gradually Appeared. The date acquired was: 10/26/2021. The wound has been in treatment 12 weeks. The wound is located on the Right,Medial Lower Leg. The wound measures 0.2cm length x 0.2cm width x 0.1cm depth; 0.031cm^2 area and 0.003cm^3 volume. There is Fat Layer (Subcutaneous Tissue) exposed. There is no tunneling or undermining noted. There is a medium amount of serosanguineous drainage noted. There is medium (34-66%) pink, pale granulation within the wound bed. There is a medium (34-66%) amount of necrotic tissue within the wound bed including Adherent Slough. Assessment Active Problems ICD-10 Lymphedema, not elsewhere classified Chronic venous hypertension (idiopathic) with ulcer and inflammation of right lower extremity Non-pressure chronic ulcer of other  part of right lower leg limited to breakdown of skin Procedures Wound #16 Pre-procedure diagnosis of Wound #16 is a Lymphedema located on the Right,Medial Lower Leg . There was a Three Layer Compression Therapy Procedure by Donnamarie Poag, RN. Post procedure Diagnosis Wound #16: Same as Pre-Procedure Plan Follow-up Appointments: Return Appointment in 1 week. Nurse Visit as needed Bathing/ Shower/ Hygiene: May shower with wound dressing protected with water repellent cover or cast protector. No tub bath. Anesthetic (  Use 'Patient Medications' Section for Anesthetic Order Entry): ZOFIA, PECKINPAUGH ([id number]) Lidocaine applied to wound bed Edema Control - Lymphedema / Segmental Compressive Device / Other: Optional: One layer of unna paste to top of compression wrap (to act as an anchor). - she needs this Elevate, Exercise Daily and Avoid Standing for Long Periods of Time. Elevate leg(s) parallel to the floor when sitting. DO YOUR BEST to sleep in the bed at night. DO NOT sleep in your recliner. Long hours of sitting in a recliner leads to swelling of the legs and/or potential wounds on your backside. Additional Orders / Instructions: Follow Nutritious Diet and Increase Protein Intake WOUND #16: - Lower Leg Wound Laterality: Right, Medial Cleanser: Soap and Water 1 x Per Week/15 Days Discharge Instructions: Gently cleanse wound with antibacterial soap, rinse and pat dry prior to dressing wounds Cleanser: Wound Cleanser 1 x Per Week/15 Days Discharge Instructions: Wash your hands with soap and water. Remove old dressing, discard into plastic bag and place into trash. Cleanse the wound with Wound Cleanser prior to applying a clean dressing using gauze sponges, not tissues or cotton balls. Do not scrub or use excessive force. Pat dry using gauze sponges, not tissue or cotton balls. Primary Dressing: Xeroform-HBD 2x2 (in/in) 1 x Per Week/15 Days Discharge Instructions: Apply Xeroform-HBD 2x2  (in/in) as directed Secondary Dressing: Gauze 1 x Per Week/15 Days Compression Wrap: 3-LAYER WRAP - Profore Lite LF 3 Multilayer Compression Bandaging System 1 x Per Week/15 Days Discharge Instructions: Apply 3 multi-layer wrap as prescribed. 1. I am good recommend that we going continue with the wound care measures as before and the patient is in agreement with plan. This includes the use of the Xeroform gauze followed by 3 layer compression wrap which I do feel like is doing excellent. 2. I am also can recommend that we have the patient continue to elevate her legs much as possible to help with edema control. We will see patient back for reevaluation in 1 week here in the clinic. If anything worsens or changes patient will contact our office for additional recommendations. Electronic Signature(s) Signed: 03/22/2022 5:28:40 PM By: Worthy Keeler PA-C Entered By: Worthy Keeler on 03/22/2022 17:28:40 Labonte, [name] ([id number]) -------------------------------------------------------------------------------- SuperBill Details Patient Name: INA, SCRIVENS. Date of Service: 03/22/2022 Medical Record Number: [id number] Patient Account Number: [id number] Date of Birth/Sex: 07-20-1946 (76 y.o. F) Treating RN: Donnamarie Poag Primary Care Provider: [name] Other Clinician: Referring Provider: [name] Treating Provider/Extender: [name] in Treatment: 16 Diagnosis Coding ICD-10 Codes Code Description I89.0 Lymphedema, not elsewhere classified I87.331 Chronic venous hypertension (idiopathic) with ulcer and inflammation of right lower extremity L97.811 Non-pressure chronic ulcer of other part of right lower leg limited to breakdown of skin Facility Procedures CPT4 Code: [id number] Description: (Facility Use Only) [phone] - [facility] RT LEG Modifier: Quantity: 1 Physician Procedures CPT4 Code Description: [id number] - WC PHYS LEVEL 3 - EST  PT Modifier: Quantity: 1 CPT4 Code Description: ICD-10 Diagnosis Description I89.0 Lymphedema, not elsewhere classified I87.331 Chronic venous hypertension (idiopathic) with ulcer and inflammation of rig L97.811 Non-pressure chronic ulcer of other part of right lower leg limited  to brea Modifier: ht lower extremit kdown of skin Quantity: y Engineer, maintenance) Signed: 03/22/2022 5:28:56 PM By: Worthy Keeler PA-C Previous Signature: 03/22/2022 4:31:13 PM Version By: Donnamarie Poag Entered By: Worthy Keeler on 03/22/2022 17:28:56

## 2022-03-23 NOTE — Progress Notes (Addendum)
KASHIRA, BEHUNIN ([id number]) Visit Report for 03/22/2022 Arrival Information Details Patient Name: SYNDEY, JASKOLSKI. Date of Service: 03/22/2022 2:30 PM Medical Record Number: [id number] Patient Account Number: [id number] Date of Birth/Sex: 1946-08-03 (76 y.o. F) Treating RN: Donnamarie Poag Primary Care Shital Crayton: [name] Other Clinician: Referring Jalan Fariss: [name] Treating Dane Bloch/Extender: [name] in Treatment: 77 Visit Information History Since Last Visit Added or deleted any medications: No Patient Arrived: Ambulatory Any new allergies or adverse reactions: No Arrival Time: 14:29 Hospitalized since last visit: No Transfer Assistance: None Pain Present Now: No Patient Requires Transmission-Based No Precautions: Patient Has Alerts: Yes Patient Alerts: Patient on Blood Thinner 53m aspirin [place] [facility] ABI Electronic Signature(s) Signed: 03/22/2022 4:31:13 PM By: BDonnamarie PoagEntered By: BDonnamarie Poagon 03/22/2022 14:30:40 Stansel, LTenna Child([id number] -------------------------------------------------------------------------------- Compression Therapy Details Patient Name: PHITOMI, SLAPE Date of Service: 03/22/2022 2:30 PM Medical Record Number: [id number] Account Number: [id number] of Birth/Sex: 906/29/1947(76y.o. F) Treating RN: BDonnamarie PoagPrimary Care Leeona Mccardle: GRia BushOther Clinician: Referring Haruo Stepanek: GRia BushTreating Kameron Glazebrook/Extender: SSkipper Clichein Treatment: 16 Compression Therapy Performed for Wound Assessment: Wound #16 Right,Medial Lower Leg Performed By: CJunius Argyle RN Compression Type: Three Layer Post Procedure Diagnosis Same as Pre-procedure Electronic Signature(s) Signed: 03/22/2022 4:31:13 PM By: BDonnamarie PoagEntered By: BDonnamarie Poagon 03/22/2022 15:04:18 PShelton LTenna Child([id number] -------------------------------------------------------------------------------- Encounter  Discharge Information Details Patient Name: PTALA, EBER Date of Service: 03/22/2022 2:30 PM Medical Record Number: [id number] Account Number: [id number] of Birth/Sex: 9May 19, 1947(76y.o. F) Treating RN: BDonnamarie PoagPrimary Care Nitara Szczerba: GRia BushOther Clinician: Referring Ji Feldner: GRia BushTreating Reyce Lubeck/Extender: SSkipper Clichein Treatment: 16 Encounter Discharge Information Items Discharge Condition: Stable Ambulatory Status: Ambulatory Discharge Destination: Home Transportation: Private Auto Accompanied By: self Schedule Follow-up Appointment: Yes Clinical Summary of Care: Electronic Signature(s) Signed: 03/22/2022 4:31:13 PM By: BDonnamarie PoagEntered By: BDonnamarie Poagon 03/22/2022 15:16:28 Shrestha, LTenna Child([id number] -------------------------------------------------------------------------------- Lower Extremity Assessment Details Patient Name: PKARYS, MECKLEY Date of Service: 03/22/2022 2:30 PM Medical Record Number: [id number] Account Number: [id number] of Birth/Sex: 909-08-1946(76y.o. F) Treating RN: BDonnamarie PoagPrimary Care Harry Shuck: GRia BushOther Clinician: Referring Lesle Faron: GRia BushTreating [name] Sakata/Extender: SJeri CosWeeks in Treatment: 16 Edema Assessment Assessed: [Left: No] [Patrice Paradise Yes] [Left: Edema] [Right: :] Calf Left: Right: Point of Measurement: 30 cm From Medial Instep 39.5 cm Ankle Left: Right: Point of Measurement: 13 cm From Medial Instep 22 cm Vascular Assessment Pulses: Dorsalis Pedis Palpable: [Right:Yes] Electronic Signature(s) Signed: 03/22/2022 4:31:13 PM By: BDonnamarie PoagEntered By: BDonnamarie Poagon 03/22/2022 14:38:47 Dubin, Enya JLenna Sciara([id number] -------------------------------------------------------------------------------- Multi Wound Chart Details Patient Name: PTAURUS, WILLIS Date of Service: 03/22/2022 2:30 PM Medical Record Number: [id number]  Account Number: [id number] of Birth/Sex: 9November 24, 1947(76y.o. F) Treating RN: BDonnamarie PoagPrimary Care Kanylah Muench: GRia BushOther Clinician: Referring Adedamola Seto: GRia BushTreating Staley Budzinski/Extender: SSkipper Clichein Treatment: 16 Vital Signs Height(in): 664Pulse(bpm): 655Weight(lbs): 199 Blood Pressure(mmHg): 145/69 Body Mass Index(BMI): 36.4 Temperature(F): 97.6 Respiratory Rate(breaths/min): 16 Photos: [N/A:N/A] Wound Location: Right, Posterior Lower Leg Right, Medial Lower Leg N/A Wounding Event: Gradually Appeared Gradually Appeared N/A Primary Etiology: Lymphedema Lymphedema N/A Secondary Etiology: Venous Leg Ulcer Venous Leg Ulcer N/A Comorbid History: Cataracts, Asthma, Sleep Apnea, Cataracts, Asthma, Sleep Apnea, N/A Deep Vein Thrombosis, Deep Vein Thrombosis, Hypertension, Peripheral Venous Hypertension, Peripheral Venous Disease, Osteoarthritis, Received Disease, Osteoarthritis, Received Chemotherapy, Received Radiation Chemotherapy, Received  Radiation Date Acquired: 10/27/2021 10/26/2021 N/A Weeks of Treatment: 16 12 N/A Wound Status: Open Open N/A Wound Recurrence: No No N/A Measurements L x W x D (cm) 0.2x0.2x0.1 0.2x0.2x0.1 N/A Area (cm) : 0.031 0.031 N/A Volume (cm) : 0.003 0.003 N/A % Reduction in Area: 100.00% 98.10% N/A % Reduction in Volume: 100.00% 98.20% N/A Classification: Full Thickness Without Exposed Full Thickness Without Exposed N/A Support Structures Support Structures Exudate Amount: Medium Medium N/A Exudate Type: Serosanguineous Serosanguineous N/A Exudate Color: red, brown red, brown N/A Wound Margin: Indistinct, nonvisible N/A N/A Granulation Amount: Large (67-100%) Medium (34-66%) N/A Granulation Quality: Pink, Pale Pink, Pale N/A Necrotic Amount: Small (1-33%) Medium (34-66%) N/A Exposed Structures: Fat Layer (Subcutaneous Tissue): Fat Layer (Subcutaneous Tissue): N/A Yes Yes Fascia: No Fascia: No Tendon:  No Tendon: No Muscle: No Muscle: No Joint: No Joint: No Bone: No Bone: No Epithelialization: Small (1-33%) None N/A Treatment Notes Electronic Signature(s) Signed: 03/22/2022 4:31:13 PM By: Kelly Splinter ([id number]) Entered By: Donnamarie Poag on 03/22/2022 14:39:27 Brilliant, [name] ([id number]) -------------------------------------------------------------------------------- Multi-Disciplinary Care Plan Details Patient Name: ARYONA, SILL. Date of Service: 03/22/2022 2:30 PM Medical Record Number: [id number] Patient Account Number: [id number] Date of Birth/Sex: 01-26-1946 (76 y.o. F) Treating RN: Donnamarie Poag Primary Care Raffaella Edison: [name] Other Clinician: Referring Zaire [name]: [name] Treating Mackie Goon/Extender: [name] in Treatment: 16 Active Inactive Necrotic Tissue Nursing Diagnoses: Impaired tissue integrity related to necrotic/devitalized tissue Knowledge deficit related to management of necrotic/devitalized tissue Goals: Necrotic/devitalized tissue will be minimized in the wound bed Date Initiated: 11/27/2021 Target Resolution Date: 12/25/2021 Goal Status: Active Patient/caregiver will verbalize understanding of reason and process for debridement of necrotic tissue Date Initiated: 11/27/2021 Date Inactivated: 02/15/2022 Target Resolution Date: 12/25/2021 Goal Status: Met Interventions: Assess patient pain level pre-, during and post procedure and prior to discharge Provide education on necrotic tissue and debridement process Notes: Venous Leg Ulcer Nursing Diagnoses: Actual venous Insuffiency (use after diagnosis is confirmed) Knowledge deficit related to disease process and management Potential for venous Insuffiency (use before diagnosis confirmed) Goals: Patient will maintain optimal edema control Date Initiated: 11/27/2021 Target Resolution Date: 12/25/2021 Goal Status: Active Patient/caregiver will verbalize understanding  of disease process and disease management Date Initiated: 11/27/2021 Date Inactivated: 01/15/2022 Target Resolution Date: 12/25/2021 Goal Status: Met Verify adequate tissue perfusion prior to therapeutic compression application Date Initiated: 11/27/2021 Date Inactivated: 03/08/2022 Target Resolution Date: 12/25/2021 Goal Status: Met Interventions: Provide education on venous insufficiency Treatment Activities: Therapeutic compression applied : 11/27/2021 Notes: Wound/Skin Impairment Nursing Diagnoses: Impaired tissue integrity Knowledge deficit related to smoking impact on wound healing Knowledge deficit related to ulceration/compromised skin integrity RETHA, BITHER ([id number]) Goals: Patient/caregiver will verbalize understanding of skin care regimen Date Initiated: 11/27/2021 Date Inactivated: 01/15/2022 Target Resolution Date: 12/25/2021 Goal Status: Met Ulcer/skin breakdown will have a volume reduction of 30% by week 4 Date Initiated: 11/27/2021 Date Inactivated: 03/08/2022 Target Resolution Date: 12/25/2021 Goal Status: Met Ulcer/skin breakdown will have a volume reduction of 50% by week 8 Date Initiated: 11/27/2021 Date Inactivated: 03/08/2022 Target Resolution Date: 01/22/2022 Goal Status: Met Ulcer/skin breakdown will have a volume reduction of 80% by week 12 Date Initiated: 11/27/2021 Target Resolution Date: 02/22/2022 Goal Status: Active Ulcer/skin breakdown will heal within 14 weeks Date Initiated: 11/27/2021 Target Resolution Date: 03/08/2022 Goal Status: Active Interventions: Assess ulceration(s) every visit Provide education on ulcer and skin care Treatment Activities: Skin care regimen initiated : 11/27/2021 Topical wound management initiated : 11/27/2021 Notes: Electronic Signature(s) Signed: 03/22/2022  4:31:13 PM By: Donnamarie Poag Entered ByDonnamarie Poag on 03/22/2022 14:39:09 Sliney, [name]  ([id number]) -------------------------------------------------------------------------------- Pain Assessment Details Patient Name: COLINE, CALKIN. Date of Service: 03/22/2022 2:30 PM Medical Record Number: [id number] Patient Account Number: [id number] Date of Birth/Sex: Jul 07, 1946 (76 y.o. F) Treating RN: Donnamarie Poag Primary Care Marise Knapper: [name] Other Clinician: Referring Faatima Tench: [name] Treating Rabia Argote/Extender: [name] in Treatment: 16 Active Problems Location of Pain Severity and Description of Pain Patient Has Paino No Site Locations Pain Management and Medication Current Pain Management: Electronic Signature(s) Signed: 03/22/2022 4:31:13 PM By: Donnamarie Poag Entered By: Donnamarie Poag on 03/22/2022 14:31:39 Ganaway, [name] ([id number]) -------------------------------------------------------------------------------- Patient/Caregiver Education Details Patient Name: CARRIANN, HESSE. Date of Service: 03/22/2022 2:30 PM Medical Record Number: [id number] Patient Account Number: [id number] Date of Birth/Gender: 05/15/1946 (76 y.o. F) Treating RN: Donnamarie Poag Primary Care Physician: [name] Other Clinician: Referring Physician: [name] Treating Physician/Extender: [name] in Treatment: 16 Education Assessment Education Provided To: Patient Education Topics Provided Basic Hygiene: Venous: Wound Debridement: Wound/Skin Impairment: Electronic Signature(s) Signed: 03/22/2022 4:31:13 PM By: Donnamarie Poag Entered By: Donnamarie Poag on 03/22/2022 14:41:27 Legore, [name] ([id number]) -------------------------------------------------------------------------------- Wound Assessment Details Patient Name: TEMPRANCE, WYRE. Date of Service: 03/22/2022 2:30 PM Medical Record Number: [id number] Patient Account Number: [id number] Date of Birth/Sex: [birth year] (76 y.o. F) Treating RN: Donnamarie Poag Primary Care Hamp Moreland: [name]  Other Clinician: Referring Shadiyah Wernli: [name] Treating Aveah Castell/Extender: [name] in Treatment: 16 Wound Status Wound Number: 15 Primary Lymphedema Etiology: Wound Location: Right, Posterior Lower Leg Secondary Venous Leg Ulcer Wounding Event: Gradually Appeared Etiology: Date Acquired: 10/27/2021 Wound Healed - Epithelialized Weeks Of Treatment: 16 Status: Clustered Wound: No Notes: wound is boxed in due to odd shape Comorbid Cataracts, Asthma, Sleep Apnea, Deep Vein Thrombosis, History: Hypertension, Peripheral Venous Disease, Osteoarthritis, Received Chemotherapy, Received Radiation Photos Wound Measurements Length: (cm) 0 Width: (cm) 0 Depth: (cm) 0 Area: (cm) Volume: (cm) % Reduction in Area: 100% % Reduction in Volume: 100% Epithelialization: Small (1-33%) 0 Tunneling: No 0 Undermining: No Wound Description Classification: Full Thickness Without Exposed Support Structu Wound Margin: Indistinct, nonvisible Exudate Amount: Medium Exudate Type: Serosanguineous Exudate Color: red, brown res Foul Odor After Cleansing: No Slough/Fibrino Yes Wound Bed Granulation Amount: None Present (0%) Exposed Structure Necrotic Amount: None Present (0%) Fascia Exposed: No Fat Layer (Subcutaneous Tissue) Exposed: No Tendon Exposed: No Muscle Exposed: No Joint Exposed: No Bone Exposed: No Electronic Signature(s) Signed: 03/22/2022 4:31:13 PM By: Donnamarie Poag Entered By: Donnamarie Poag on 03/22/2022 15:03:14 Plaut, [name] ([id number]) -------------------------------------------------------------------------------- Wound Assessment Details Patient Name: BRAELYNN, BENNING. Date of Service: 03/22/2022 2:30 PM Medical Record Number: [id number] Patient Account Number: [id number] Date of Birth/Sex: 1946-03-05 (76 y.o. F) Treating RN: Donnamarie Poag Primary Care Maikayla Beggs: [name] Other Clinician: Referring Chandni Gagan: [name] Treating  Marva Hendryx/Extender: [name] in Treatment: 16 Wound Status Wound Number: 16 Primary Lymphedema Etiology: Wound Location: Right, Medial Lower Leg Secondary Venous Leg Ulcer Wounding Event: Gradually Appeared Etiology: Date Acquired: 10/26/2021 Wound Open Weeks Of Treatment: 12 Status: Clustered Wound: No Comorbid Cataracts, Asthma, Sleep Apnea, Deep Vein Thrombosis, History: Hypertension, Peripheral Venous Disease, Osteoarthritis, Received Chemotherapy, Received Radiation Photos Wound Measurements Length: (cm) 0.2 Width: (cm) 0.2 Depth: (cm) 0.1 Area: (cm) 0.031 Volume: (cm) 0.003 % Reduction in Area: 98.1% % Reduction in Volume: 98.2% Epithelialization: None Tunneling: No Undermining: No Wound Description Classification: Full Thickness Without Exposed Support Structures Exudate Amount: Medium Exudate Type: Serosanguineous Exudate  Color: red, brown Foul Odor After Cleansing: No Slough/Fibrino Yes Wound Bed Granulation Amount: Medium (34-66%) Exposed Structure Granulation Quality: Pink, Pale Fascia Exposed: No Necrotic Amount: Medium (34-66%) Fat Layer (Subcutaneous Tissue) Exposed: Yes Necrotic Quality: Adherent Slough Tendon Exposed: No Muscle Exposed: No Joint Exposed: No Bone Exposed: No Treatment Notes Wound #16 (Lower Leg) Wound Laterality: Right, Medial Cleanser Soap and Water Discharge Instruction: Gently cleanse wound with antibacterial soap, rinse and pat dry prior to dressing wounds MANJOT, BEUMER ([id number]) Wound Cleanser Discharge Instruction: Wash your hands with soap and water. Remove old dressing, discard into plastic bag and place into trash. Cleanse the wound with Wound Cleanser prior to applying a clean dressing using gauze sponges, not tissues or cotton balls. Do not scrub or use excessive force. Pat dry using gauze sponges, not tissue or cotton balls. Peri-Wound Care Topical Primary Dressing Xeroform-HBD 2x2  (in/in) Discharge Instruction: Apply Xeroform-HBD 2x2 (in/in) as directed Secondary Dressing Gauze Secured With Compression Wrap 3-LAYER WRAP - Profore Lite LF 3 Multilayer Compression Bandaging System Discharge Instruction: Apply 3 multi-layer wrap as prescribed. Compression Stockings Add-Ons Electronic Signature(s) Signed: 03/22/2022 4:31:13 PM By: Donnamarie Poag Entered By: Donnamarie Poag on 03/22/2022 14:37:49 Schley, [name] ([id number]) -------------------------------------------------------------------------------- Vitals Details Patient Name: VINISHA, FAXON. Date of Service: 03/22/2022 2:30 PM Medical Record Number: [id number] Patient Account Number: [id number] Date of Birth/Sex: 11/03/1946 (76 y.o. F) Treating RN: Donnamarie Poag Primary Care Jashad Depaula: [name] Other Clinician: Referring Saidi Santacroce: [name] Treating Evaluna Utke/Extender: [name] in Treatment: 16 Vital Signs Time Taken: 14:30 Temperature (F): 97.6 Height (in): 62 Pulse (bpm): 67 Weight (lbs): 199 Respiratory Rate (breaths/min): 16 Body Mass Index (BMI): 36.4 Blood Pressure (mmHg): 145/69 Reference Range: 80 - 120 mg / dl Electronic Signature(s) Signed: 03/22/2022 4:31:13 PM By: Donnamarie Poag Entered ByDonnamarie Poag on 03/22/2022 14:31:23

## 2022-03-29 ENCOUNTER — Encounter: Payer: Medicare Other | Admitting: Physician Assistant

## 2022-03-29 ENCOUNTER — Ambulatory Visit (INDEPENDENT_AMBULATORY_CARE_PROVIDER_SITE_OTHER): Payer: Medicare Other | Admitting: Podiatry

## 2022-03-29 ENCOUNTER — Encounter: Payer: Self-pay | Admitting: Podiatry

## 2022-03-29 DIAGNOSIS — B351 Tinea unguium: Secondary | ICD-10-CM | POA: Diagnosis not present

## 2022-03-29 DIAGNOSIS — M79674 Pain in right toe(s): Secondary | ICD-10-CM | POA: Diagnosis not present

## 2022-03-29 DIAGNOSIS — L97811 Non-pressure chronic ulcer of other part of right lower leg limited to breakdown of skin: Secondary | ICD-10-CM | POA: Diagnosis not present

## 2022-03-29 DIAGNOSIS — Z95828 Presence of other vascular implants and grafts: Secondary | ICD-10-CM | POA: Diagnosis not present

## 2022-03-29 DIAGNOSIS — I872 Venous insufficiency (chronic) (peripheral): Secondary | ICD-10-CM | POA: Diagnosis not present

## 2022-03-29 DIAGNOSIS — R600 Localized edema: Secondary | ICD-10-CM | POA: Diagnosis not present

## 2022-03-29 DIAGNOSIS — M79675 Pain in left toe(s): Secondary | ICD-10-CM

## 2022-03-29 DIAGNOSIS — I87331 Chronic venous hypertension (idiopathic) with ulcer and inflammation of right lower extremity: Secondary | ICD-10-CM | POA: Diagnosis not present

## 2022-03-29 DIAGNOSIS — I890 Lymphedema, not elsewhere classified: Secondary | ICD-10-CM | POA: Diagnosis not present

## 2022-03-29 DIAGNOSIS — G629 Polyneuropathy, unspecified: Secondary | ICD-10-CM | POA: Diagnosis not present

## 2022-03-29 NOTE — Progress Notes (Addendum)
WILLISTINE, FERRALL ([id number]) Visit Report for 03/29/2022 Arrival Information Details Patient Name: [name], [name]. Date of Service: 03/29/2022 1:45 PM Medical Record Number: [id number] Patient Account Number: [id number] Date of Birth/Sex: 01/20/1946 (76 y.o. F) Treating RN: Levora Dredge Primary Care Mell [name]: [name] Other Clinician: Referring Darrio Bade: [name] Treating Castulo Scarpelli/Extender: [name] in Treatment: 80 Visit Information History Since Last Visit Added or deleted any medications: No Patient Arrived: Ambulatory Any new allergies or adverse reactions: No Arrival Time: 13:37 Had a fall or experienced change in No Accompanied By: self activities of daily living that may affect Transfer Assistance: None risk of falls: Patient Identification Verified: Yes Hospitalized since last visit: No Secondary Verification Process Completed: Yes Has Dressing in Place as Prescribed: Yes Patient Requires Transmission-Based No Has Compression in Place as Prescribed: Yes Precautions: Pain Present Now: No Patient Has Alerts: Yes Patient Alerts: Patient on Blood Thinner 59m aspirin [place] vascular ABI Electronic Signature(s) Signed: 03/29/2022 4:20:08 PM By: GLevora DredgeEntered By: GLevora Dredgeon 03/29/2022 13:40:50 Tarr, LTenna Child([id number] -------------------------------------------------------------------------------- Clinic Level of Care Assessment Details Patient Name: [name], [name] Date of Service: 03/29/2022 1:45 PM Medical Record Number: [id number] Account Number: [id number] of Birth/Sex: [birth year](76y.o. F) Treating RN: GLevora DredgePrimary Care Myrella Fahs: GRia BushOther Clinician: Referring Nobuo Nunziata: GRia BushTreating George Alcantar/Extender: SSkipper Clichein Treatment: 17 Clinic Level of Care Assessment Items TOOL 1 Quantity Score '[]'  - Use when EandM and Procedure is performed on INITIAL visit  0 ASSESSMENTS - Nursing Assessment / Reassessment '[]'  - General Physical Exam (combine w/ comprehensive assessment (listed just below) when performed on new 0 pt. evals) '[]'  - 0 Comprehensive Assessment (HX, ROS, Risk Assessments, Wounds Hx, etc.) ASSESSMENTS - Wound and Skin Assessment / Reassessment '[]'  - Dermatologic / Skin Assessment (not related to wound area) 0 ASSESSMENTS - Ostomy and/or Continence Assessment and Care '[]'  - Incontinence Assessment and Management 0 '[]'  - 0 Ostomy Care Assessment and Management (repouching, etc.) PROCESS - Coordination of Care '[]'  - Simple Patient / Family Education for ongoing care 0 '[]'  - 0 Complex (extensive) Patient / Family Education for ongoing care '[]'  - 0 Staff obtains CProgrammer, systems Records, Test Results / Process Orders '[]'  - 0 Staff telephones HHA, Nursing Homes / Clarify orders / etc '[]'  - 0 Routine Transfer to another Facility (non-emergent condition) '[]'  - 0 Routine Hospital Admission (non-emergent condition) '[]'  - 0 New Admissions / IBiomedical engineer/ Ordering NPWT, Apligraf, etc. '[]'  - 0 Emergency Hospital Admission (emergent condition) PROCESS - Special Needs '[]'  - Pediatric / Minor Patient Management 0 '[]'  - 0 Isolation Patient Management '[]'  - 0 Hearing / Language / Visual special needs '[]'  - 0 Assessment of Community assistance (transportation, D/C planning, etc.) '[]'  - 0 Additional assistance / Altered mentation '[]'  - 0 Support Surface(s) Assessment (bed, cushion, seat, etc.) INTERVENTIONS - Miscellaneous '[]'  - External ear exam 0 '[]'  - 0 Patient Transfer (multiple staff / HCivil Service fast streamer/ Similar devices) '[]'  - 0 Simple Staple / Suture removal (25 or less) '[]'  - 0 Complex Staple / Suture removal (26 or more) '[]'  - 0 Hypo/Hyperglycemic Management (do not check if billed separately) '[]'  - 0 Ankle / Brachial Index (ABI) - do not check if billed separately Has the patient been seen at the hospital within the last three years:  Yes Total Score: 0 Level Of Care: ____ PJannifer Franklin([id number] Electronic Signature(s) Signed: 03/29/2022 4:20:08 PM By: GLevora DredgeEntered By: GAraceli Bouche  Caitlin on 03/29/2022 14:21:21 QUANIKA, SOLEM ([id number]) -------------------------------------------------------------------------------- Compression Therapy Details Patient Name: [name], [name]. Date of Service: 03/29/2022 1:45 PM Medical Record Number: [id number] Patient Account Number: [id number] Date of Birth/Sex: 1946-02-08 (76 y.o. F) Treating RN: Levora Dredge Primary Care Avni Traore: [name] Other Clinician: Referring Parrish Bonn: [name] Treating Mannat Benedetti/Extender: [name] in Treatment: 17 Compression Therapy Performed for Wound Assessment: NonWound Condition Lymphedema - Right Leg Performed By: Clinician Levora Dredge, RN Compression Type: Three Layer Post Procedure Diagnosis Same as Pre-procedure Electronic Signature(s) Signed: 03/29/2022 4:20:08 PM By: Levora Dredge Entered By: Levora Dredge on 03/29/2022 14:21:12 SALMA, WALROND ([id number]) -------------------------------------------------------------------------------- Encounter Discharge Information Details Patient Name: [name], [name]. Date of Service: 03/29/2022 1:45 PM Medical Record Number: [id number] Patient Account Number: [id number] Date of Birth/Sex: 01/09/1946 (76 y.o. F) Treating RN: Levora Dredge Primary Care Tyson Masin: [name] Other Clinician: Referring Avner Stroder: [name] Treating Shreyan [name]/Extender: [name] in Treatment: 17 Encounter Discharge Information Items Discharge Condition: Stable Ambulatory Status: Ambulatory Discharge Destination: Home Transportation: Private Auto Accompanied By: self Schedule Follow-up Appointment: Yes Clinical Summary of Care: Electronic Signature(s) Signed: 03/29/2022 4:20:08 PM By: Levora Dredge Entered By: Levora Dredge on 03/29/2022  14:22:10 [name] ([id number]) -------------------------------------------------------------------------------- Lower Extremity Assessment Details Patient Name: [name], [name]. Date of Service: 03/29/2022 1:45 PM Medical Record Number: [id number] Patient Account Number: [id number] Date of Birth/Sex: 21-Jul-1946 (76 y.o. F) Treating RN: Levora Dredge Primary Care Carla Whilden: [name] Other Clinician: Referring Evianna Chandran: [name] Treating Omarr Hann/Extender: Jeri Cos Weeks in Treatment: 17 Edema Assessment Assessed: [Left: No] [Right: No] Edema: [Left: Ye] [Right: s] Calf Left: Right: Point of Measurement: 30 cm From Medial Instep 42.8 cm Ankle Left: Right: Point of Measurement: 13 cm From Medial Instep 22.5 cm Vascular Assessment Pulses: Dorsalis Pedis Palpable: [Right:Yes] Electronic Signature(s) Signed: 03/29/2022 4:20:08 PM By: Levora Dredge Entered By: Levora Dredge on 03/29/2022 13:50:08 [place], [name] ([id number]) -------------------------------------------------------------------------------- Multi Wound Chart Details Patient Name: [name], [name]. Date of Service: 03/29/2022 1:45 PM Medical Record Number: [id number] Patient Account Number: [id number] Date of Birth/Sex: 08/11/1946 (76 y.o. F) Treating RN: Levora Dredge Primary Care Fannie Alomar: [name] Other Clinician: Referring Donni Oglesby: [name] Treating Asianae Minkler/Extender: [name] in Treatment: 17 Vital Signs Height(in): 62 Pulse(bpm): 22 Weight(lbs): 199 Blood Pressure(mmHg): 153/78 Body Mass Index(BMI): 36.4 Temperature(F): 98 Respiratory Rate(breaths/min): 18 Photos: [N/A:N/A] Wound Location: Right, Medial Lower Leg N/A N/A Wounding Event: Gradually Appeared N/A N/A Primary Etiology: Lymphedema N/A N/A Secondary Etiology: Venous Leg Ulcer N/A N/A Comorbid History: Cataracts, Asthma, Sleep Apnea, N/A N/A Deep Vein Thrombosis, Hypertension,  Peripheral Venous Disease, Osteoarthritis, Received Chemotherapy, Received Radiation Date Acquired: 10/26/2021 N/A N/A Weeks of Treatment: 13 N/A N/A Wound Status: Open N/A N/A Wound Recurrence: No N/A N/A Measurements L x W x D (cm) 0.1x0.1x0.1 N/A N/A Area (cm) : 0.008 N/A N/A Volume (cm) : 0.001 N/A N/A % Reduction in Area: 99.50% N/A N/A % Reduction in Volume: 99.40% N/A N/A Classification: Full Thickness Without Exposed N/A N/A Support Structures Exudate Amount: None Present N/A N/A Granulation Amount: None Present (0%) N/A N/A Necrotic Amount: None Present (0%) N/A N/A Exposed Structures: Fascia: No N/A N/A Fat Layer (Subcutaneous Tissue): No Tendon: No Muscle: No Joint: No Bone: No Epithelialization: Large (67-100%) N/A N/A [name], LEIN ([id number]) Treatment Notes Electronic Signature(s) Signed: 03/29/2022 4:20:08 PM By: Levora Dredge Entered By: Levora Dredge on 03/29/2022 14:09:06 [name] ([id number]) -------------------------------------------------------------------------------- [place] Details Patient Name: [name], [name]. Date  of Service: 03/29/2022 1:45 PM Medical Record Number: [id number] Patient Account Number: [id number] Date of Birth/Sex: Jul 05, 1946 (76 y.o. F) Treating RN: Levora Dredge Primary Care Preslee Regas: [name] Other Clinician: Referring Zaryah Seckel: [name] Treating Roland Lipke/Extender: [name] in Treatment: 17 Active Inactive Wound/Skin Impairment Nursing Diagnoses: Impaired tissue integrity Knowledge deficit related to smoking impact on wound healing Knowledge deficit related to ulceration/compromised skin integrity Goals: Patient/caregiver will verbalize understanding of skin care regimen Date Initiated: 11/27/2021 Date Inactivated: 01/15/2022 Target Resolution Date: 12/25/2021 Goal Status: Met Ulcer/skin breakdown will have a volume reduction of 30% by week 4 Date  Initiated: 11/27/2021 Date Inactivated: 03/08/2022 Target Resolution Date: 12/25/2021 Goal Status: Met Ulcer/skin breakdown will have a volume reduction of 50% by week 8 Date Initiated: 11/27/2021 Date Inactivated: 03/08/2022 Target Resolution Date: 01/22/2022 Goal Status: Met Ulcer/skin breakdown will have a volume reduction of 80% by week 12 Date Initiated: 11/27/2021 Target Resolution Date: 02/22/2022 Goal Status: Active Ulcer/skin breakdown will heal within 14 weeks Date Initiated: 11/27/2021 Target Resolution Date: 03/08/2022 Goal Status: Active Interventions: Assess ulceration(s) every visit Provide education on ulcer and skin care Treatment Activities: Skin care regimen initiated : 11/27/2021 Topical wound management initiated : 11/27/2021 Notes: Electronic Signature(s) Signed: 03/29/2022 4:20:08 PM By: Levora Dredge Entered By: Levora Dredge on 03/29/2022 14:08:45 Marquart, [name] ([id number]) -------------------------------------------------------------------------------- Pain Assessment Details Patient Name: [name], [name]. Date of Service: 03/29/2022 1:45 PM Medical Record Number: [id number] Patient Account Number: [id number] Date of Birth/Sex: 09/24/1946 (76 y.o. F) Treating RN: Levora Dredge Primary Care [name] Vanblarcom: [name] Other Clinician: Referring Romari Gasparro: [name] Treating Tattiana Fakhouri/Extender: [name] in Treatment: 17 Active Problems Location of Pain Severity and Description of Pain Patient Has Paino No Site Locations Rate the pain. Current Pain Level: 0 Pain Management and Medication Current Pain Management: Electronic Signature(s) Signed: 03/29/2022 4:20:08 PM By: Levora Dredge Entered By: Levora Dredge on 03/29/2022 13:41:20 [place], [name] ([id number]) -------------------------------------------------------------------------------- Patient/Caregiver Education Details Patient Name: [name], [name]. Date of Service: 03/29/2022  1:45 PM Medical Record Number: [id number] Patient Account Number: [id number] Date of Birth/Gender: 11-21-1945 (76 y.o. F) Treating RN: Levora Dredge Primary Care Physician: [name] Other Clinician: Referring Physician: [name] Treating Physician/Extender: [name] in Treatment: 17 Education Assessment Education Provided To: Patient Education Topics Provided Wound/Skin Impairment: Handouts: Caring for Your Ulcer Methods: Explain/Verbal Responses: State content correctly Electronic Signature(s) Signed: 03/29/2022 4:20:08 PM By: Levora Dredge Entered By: Levora Dredge on 03/29/2022 14:21:39 DELAILA, NAND ([id number]) -------------------------------------------------------------------------------- Wound Assessment Details Patient Name: [name], [name]. Date of Service: 03/29/2022 1:45 PM Medical Record Number: [id number] Patient Account Number: [id number] Date of Birth/Sex: 10/06/1946 (76 y.o. F) Treating RN: Levora Dredge Primary Care Acel Natzke: [name] Other Clinician: Referring Raelea Gosse: [name] Treating Jaremy Nosal/Extender: [name] in Treatment: 17 Wound Status Wound Number: 16 Primary Lymphedema Etiology: Wound Location: Right, Medial Lower Leg Secondary Venous Leg Ulcer Wounding Event: Gradually Appeared Etiology: Date Acquired: 10/26/2021 Wound Open Weeks Of Treatment: 13 Status: Clustered Wound: No Comorbid Cataracts, Asthma, Sleep Apnea, Deep Vein Thrombosis, History: Hypertension, Peripheral Venous Disease, Osteoarthritis, Received Chemotherapy, Received Radiation Photos Wound Measurements Length: (cm) 0.1 Width: (cm) 0.1 Depth: (cm) 0.1 Area: (cm) 0.008 Volume: (cm) 0.001 % Reduction in Area: 99.5% % Reduction in Volume: 99.4% Epithelialization: Large (67-100%) Tunneling: No Undermining: No Wound Description Classification: Full Thickness Without Exposed Support Structures Exudate Amount:  None Present Foul Odor After Cleansing: No Slough/Fibrino Yes Wound Bed Granulation Amount: None Present (0%) Exposed Structure Necrotic Amount:  None Present (0%) Fascia Exposed: No Fat Layer (Subcutaneous Tissue) Exposed: No Tendon Exposed: No Muscle Exposed: No Joint Exposed: No Bone Exposed: No Treatment Notes Wound #16 (Lower Leg) Wound Laterality: Right, Medial Cleanser Soap and Water Discharge Instruction: Gently cleanse wound with antibacterial soap, rinse and pat dry prior to dressing wounds Wound Cleanser NOVEMBER, SYPHER ([id number]) Discharge Instruction: Wash your hands with soap and water. Remove old dressing, discard into plastic bag and place into trash. Cleanse the wound with Wound Cleanser prior to applying a clean dressing using gauze sponges, not tissues or cotton balls. Do not scrub or use excessive force. Pat dry using gauze sponges, not tissue or cotton balls. Peri-Wound Care Topical Primary Dressing Xeroform-HBD 2x2 (in/in) Discharge Instruction: Apply Xeroform-HBD 2x2 (in/in) as directed Secondary Dressing Gauze Secured With Compression Wrap 3-LAYER WRAP - Profore Lite LF 3 Multilayer Compression Bandaging System Discharge Instruction: Apply 3 multi-layer wrap as prescribed. Compression Stockings Add-Ons Electronic Signature(s) Signed: 03/29/2022 4:20:08 PM By: Levora Dredge Entered By: Levora Dredge on 03/29/2022 13:49:41 Double, [name] ([id number]) -------------------------------------------------------------------------------- Vitals Details Patient Name: [name], [name]. Date of Service: 03/29/2022 1:45 PM Medical Record Number: [id number] Patient Account Number: [id number] Date of Birth/Sex: 06-19-1946 (76 y.o. F) Treating RN: Levora Dredge Primary Care [place]: [name] Other Clinician: Referring Sherita Decoste: [name] Treating Jarek Longton/Extender: [name] in Treatment: 17 Vital Signs Time Taken:  13:40 Temperature (F): 98 Height (in): 62 Pulse (bpm): 77 Weight (lbs): 199 Respiratory Rate (breaths/min): 18 Body Mass Index (BMI): 36.4 Blood Pressure (mmHg): 153/78 Reference Range: 80 - 120 mg / dl Electronic Signature(s) Signed: 03/29/2022 4:20:08 PM By: Levora Dredge Entered By: Levora Dredge on 03/29/2022 13:41:08

## 2022-03-29 NOTE — Progress Notes (Addendum)
[name] ([id number]) Visit Report for 03/29/2022 Chief Complaint Document Details Patient Name: [name], LOVETT. Date of Service: 03/29/2022 1:45 PM Medical Record Number: [id number] Patient Account Number: [id number] Date of Birth/Sex: 22-Apr-1946 (76 y.o. F) Treating RN: Levora Dredge Primary Care Provider: [name] Other Clinician: Referring Provider: [name] Treating Provider/Extender: [name] in Treatment: 17 Information Obtained from: Patient Chief Complaint Right LE Ulcer Electronic Signature(s) Signed: 03/29/2022 1:36:45 PM By: Worthy Keeler [name]-C Entered By: Worthy Keeler on 03/29/2022 13:36:45 Shinn, [name] ([id number]) -------------------------------------------------------------------------------- HPI Details Patient Name: [name]. Date of Service: 03/29/2022 1:45 PM Medical Record Number: [id number] Patient Account Number: [id number] Date of Birth/Sex: 04-26-1946 (76 y.o. F) Treating RN: Levora Dredge Primary Care Provider: [name] Other Clinician: Referring Provider: [name] Treating Provider/Extender: [name] in Treatment: 17 History of Present Illness HPI Description: Pleasant 76 year old with history of chronic venous insufficiency. No diabetes or peripheral vascular disease. Left ABI 1.29. Questionable history of left lower extremity DVT. She developed a recurrent ulceration on her left lateral calf in December 2015, which she attributes to poor diet and subsequent lower extremity edema. She underwent endovenous laser ablation of her left greater saphenous vein in 2010. She underwent laser ablation of accessory branch of left GSV in April 2016 by Dr. Kellie Simmering at [facility]. She was previously wearing Unna boots, which she tolerated well. Tolerating 2 layer compression and cadexomer iodine. She returns to clinic for follow-up and is without new complaints. She denies any significant pain at  this time. She reports persistent pain with pressure. No claudication or ischemic rest pain. No fever or chills. No drainage. READMISSION 11/13/16; this is a 76 year old woman who is not a diabetic. She is here for a review of a painful area on her left medial lower extremity. I note that she was seen here previously last year for wound I believe to be in the same area. At that time she had undergone previously a left greater saphenous vein ablation by Dr. Kellie Simmering and she had a ablation of the anterior accessory branch of the left greater saphenous vein in March 2016. Seeing that the wound actually closed over. In reviewing the history with her today the ulcer in this area has been recurrent. She describes a biopsy of this area in 2009 that only showed stasis physiology. She also has a history of today malignant melanoma in the right shoulder for which she follows with Dr. 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 Do not see that she has ever had arterial studies in the left leg. She tells me she has a follow-up with Dr. Kellie Simmering in roughly 10 days In any case she developed the reopening of this area roughly a month ago. On the background of this she describes rapidly increasing edema which has responded to Lasix 40 mg and metolazone 2.5 mg as well as the patient's lymph massage. She has been told she has both venous insufficiency and lymphedema but she cannot tolerate compression stockings 11/28/16; the patient saw Dr. Kellie Simmering recently. Per the patient he did arterial Dopplers in the office that did not show evidence of arterial insufficiency, per the patient he stated "treat this like an ordinary venous ulcer". She also saw her dermatologist Dr. Ronnald Ramp who felt that this was more of a vascular ulcer. In general things are improving although she  arrives today with increasing bilateral lower  extremity edema with weeping a deeper fluid through the wound on the left medial leg compatible with some degree of lymphedema 12/04/16; the patient's wound is fully epithelialized but I don't think fully healed. We will do another week of depression with Promogran and TCA however I suspect we'll be able to discharge her next week. This is a very unusual-looking wound which was initially a figure-of-eight type wound lying on its side surrounded by petechial like hemorrhage. She has had venous ablation on this side. She apparently does not have an arterial issue per Dr. Kellie Simmering. She saw her dermatologist thought it was "vascular". Patient is definitely going to need ongoing compression and I talked about this with her today she will go to elastic therapy after she leaves here next week 12/11/16; the patient's wound is not completely closed today. She has surrounding scar tissue and in further discussion with the patient it would appear that she had ulcers in this area in 2009 for a prolonged period of time ultimately requiring a punch biopsy of this area that only showed venous insufficiency. I did not previously pickup on this part of the history from the patient. 12/18/16; the patient's wound is completely epithelialized. There is no open area here. She has significant bilateral venous insufficiency with secondary lymphedema to a mild-to-moderate degree she does not have compression stockings.. She did not say anything to me when I was in the room, she told our intake nurse that she was still having pain in this area. This isn't unusual recurrent small open area. She is going to go to elastic therapy to obtain compression stockings. 12/25/16; the patient's wound is fully epithelialized. There is no open area here. The patient describes some continued episodic discomfort in this area medial left calf. However everything looks fine and healed here. She is been to elastic therapy and caught herself 15-20 mmHg  stockings, they apparently were having trouble getting 20-30 mm stockings in her size 01/22/17; this is a patient we discharged from the clinic a month ago. She has a recurrent open wound on her medial left calf. She had 15 mm support stockings. I told her I thought she needed 20-30 mm compression stockings. She tells me that she has been ill with hospitalization secondary to asthma and is been found to have severe hypokalemia likely secondary to a combination of Lasix and metolazone. This morning she noted blistering and leaking fluid on the posterior part of her left leg. She called our intake nurse urgently and we was saw her this afternoon. She has not had any real discomfort here. I don't know that she's been wearing any stockings on this leg for at least 2-3 days. ABIs in this clinic were 1.21 on the right and 1.3 on the left. She is previously seen vascular surgery who does not think that there is a peripheral arterial issue. 01/30/17; Patient arrives with no open wound on the left leg. She has been to elastic therapy and obtained 20-56mhg below knee stockings and she has one on the right leg today. READMISSION 02/19/18; this PSwangois a now 76year old patient we've had in this clinic perhaps 3 times before. I had last looked at her from January 07 December 2016 with an area on the medial left leg. We discharged her on 12/25/16 however she had to be readmitted on 01/22/17 with a recurrence. I have in my notes that we discharged her on 20-30 mm stockings although she tells me  she was only wearing support hose because she cannot get stockings on predominantly related to her cervical spine surgery/issues. She has had previous ablations done by vein and vascular in [place] including a great saphenous vein ablation on the left with an anterior accessory branch ablation I think both of these were in 2016. On one of the previous visit she had a biopsy noted 2009 that was negative. She is not felt to  have an arterial issue. She is not a diabetic. She does have a history of obstructive sleep apnea hypertension asthma as well as chronic venous insufficiency and lymphedema. On this occasion she noted 2 dry scaly patch on her left leg. She tried to put lotion on this it didn't really help. There were 2 open areas.the patient has been seeing her primary physician from 02/05/18 through 02/14/18. She had Unna boots applied. The superior wound now on the lateral left leg has closed but she's had one wound that remains open on the lateral left leg. This is not the same spot as we dealt with in 2018. ABIs in this clinic were 1.3 bilaterally PRIYAH, [name] ([id number]) 02/26/18; patient has a small wound on the left lateral calf. Dimensions are down. She has chronic venous insufficiency and lymphedema. 03/05/18; small open area on the left lateral calf. Dimensions are down. Tightly adherent necrotic debris over the surface of the wound which was difficult to remove. Also the dressing [over collagen] stuck to the wound surface. This was removed with some difficulty as well. Change the primary dressing to Hydrofera Blue ready 03/12/18; small open area on the left lateral calf. Comes in with tightly adherent surface eschar as well as some adherent Hydrofera Blue. 03/19/18; open area on the left lateral calf. Again adherent surface eschar as well as some adherent Hydrofera Blue nonviable subcutaneous tissue. She complained of pain all week even with the reduction from 4-3 layer compression I put on last week. Also she had an increase in her ankle and calf measurements probably related to the same thing. 03/26/18; open area on the left lateral calf. A very small open area remains here. We used silver alginate starting last week as the Hydrofera Blue seem to stick to the wound bed. In using 4-layer compression 04/02/18; the open area in the left lateral calf at some adherent slough which I removed there is no open area  here. We are able to transition her into her own compression stocking. Truthfully I think this is probably his support hose. However this does not maintain skin integrity will be limited. She cannot put over the toe compression stockings on because of neck problems hand problems etc. She is allergic to the lining layer of juxta lites. We might be forced to use extremitease stocking should this fail READMIT 11/24/2018 Patient is now a 76 year old woman who is not a diabetic. She has been in this clinic on at least 3 previous occasions largely with recurrent wounds on her left leg secondary to chronic venous insufficiency with secondary lymphedema. Her situation is complicated by inability to get stockings on and an allergy to neoprene which is apparently a component and at least juxta lites and other stockings. As a result she really has not been wearing any stockings on her legs. She tells [place] that roughly 2 or 3 weeks ago she started noticing a stinging sensation just above her ankle on the left medial aspect. She has been diagnosed with pseudogout and she wondered whether this was what she was  experiencing. She tried to dress this with something she bought at the store however subsequently it pulled skin off and now she has an open wound that is not improving. She has been using Vaseline gauze with a cover bandage. She saw her primary doctor last week who put an Haematologist on her. ABIs in this clinic was 1.03 on the left 2/12; the area is on the left medial ankle. Odd-looking wound with what looks to be surface epithelialization but a multitude of small petechial openings. This clearly not closed yet. We have been using silver alginate under 3 layer compression with TCA 2/19; the wound area did not look quite as good this week. Necrotic debris over the majority of the wound surface which required debridement. She continues to have a multitude of what looked to be small petechial openings. She  reminds [place] that she had a biopsy on this initially during her first outbreak in 2015 in [place] dermatology. She expresses concern about this being a possible melanoma. She apparently had a nodular melanoma up on her shoulder that was treated with excision, lymph node removal and ultimately radiation. I assured her that this does not look anything like melanoma. Except for the petechial reaction it does look like a venous insufficiency area and she certainly has evidence of this on both sides 2/26; a difficult area on the left medial ankle. The patient clearly has chronic venous hypertension with some degree of lymphedema. The odd thing about the area is the small petechial hemorrhages. I am not really sure how to explain this. This was present last time and this is not a compression injury. We have been using Hydrofera Blue which I changed to last week 3/4; still using Hydrofera Blue. Aggressive debridement today. She does not have known arterial issues. She has seen Dr. Kellie Simmering at [facility] vein and vascular and and has an ablation on the left. [Anterior accessory branch of the greater saphenous]. From what I remember they did not feel she had an arterial issue. The patient has had this area biopsied in 2009 at [facility] dermatology and by her recollection they said this was "stasis". She is also follow-up with dermatology locally who thought that this was more of a vascular issue 3/11; using Hydrofera Blue. Aggressive debridement today. She does not have an arterial issue. We are using 3 layer compression although we may need to go to 4. The patient has been in for multiple changes to her wrap since I last saw her a week ago. She says that the area was leaking. I do not have too much more information on what was found 01/19/19 on evaluation today patient was actually being seen for a nurse visit when unfortunately she had the area on her left lateral lower extremity as well as weeping from the  right lower extremity that became apparent. Therefore we did end up actually seeing her for a full visit with myself. She is having some pain at this site as well but fortunately nothing too significant at this point. No fevers, chills, nausea, or vomiting noted at this time. 3/18-Patient is back to the clinic with the left leg venous leg ulcer, the ulcer is larger in size, has a surface that is densely adherent with fibrinous tissue, the Hydrofera Blue was used but is densely adherent and there was difficulty in removing it. The right lower extremity was also wrapped for weeping edema. Patient has a new area over the left lateral foot above the malleolus that is  small and appears to have no debris with intact surrounding skin. Patient is on increased dose of Lasix also as a means to edema management 3/25; the patient has a nonhealing venous ulcer on the medial left leg and last week developed a smaller area on the lateral left calf. We have been using Hydrofera Blue with a contact layer. 4/1; no major change in these wounds areas. Left medial and more recently left lateral calf. I tried Iodoflex last week to aid in debridement she did not tolerate this. She stated her pain was terrible all week. She took the top layer of the 4 layer compression off. 4/8; the patient actually looks somewhat better in terms of her more prominent left lateral calf wound. There is some healthy looking tissue here. She is still complaining of a lot of discomfort. 4/15; patient in a lot of pain secondary to sciatica. She is on a prednisone taper prescribed by her primary physician. She has the 2 areas one on the left medial and more recently a smaller area on the left lateral calf. Both of these just above the malleoli 4/22; her back pain is better but she still states she is very uncomfortable and now feels she is intolerant to the [employer]. No real change in the wounds we have been using Sorbact. She has been  previously intolerant to Iodoflex. There is not a lot of option about what we can use to debride this wound under compression that she no doubt needs. sHe states Ultram no longer works for her pain 4/29; no major change in the wounds slightly increased depth. Surface on the original medial wound perhaps somewhat improved however the more recent area on the lateral left ankle is 100% covered in very adherent debris we have been using Sorbact. She tolerates 4 layer compression well and her edema control is a lot better. She has not had to come in for a nurse check 5/6; no major change in the condition of the wounds. She did consent to debridement today which was done with some difficulty. Continuing Sorbact. She did not tolerate Iodoflex. She was in for a check of her compression the day after we wrapped her last week this was adjusted but nothing much was found 5/13; no major change in the condition or area of the wounds. I was able to get a fairly aggressive debridement done on the lateral left leg wound. Even using Sorbact under compression. She came back in on Friday to have the wrap changed. She says she felt uncomfortable on the lateral aspect of her ankle. She has a long history of chronic venous insufficiency including previous ablation surgery on this side. 5/20-Patient returns for wounds on left leg with both wounds covered in slough, with the lateral leg wound larger in size, she has been in 3 layer compression and felt more comfortable, she describes pain in ankle, in leg and pins and needles in foot, and is about to try Pamelor for this 6/3; wounds on the left lateral and left medial leg. The area medially which is the most recent of the 2 seems to have had the largest increase in Nolic, JAMELA CUMBO. ([id number]) dimensions. We have been using Sorbac to try and debride the surface. She has been to see orthopedics they apparently did a plain x-ray that was indeterminant. Diagnosed her with  neuropathy and they have ordered an MRI to determine if there is underlying osteomyelitis. This was not high on my thought list but I suppose  it is prudent. We have advised her to make an appointment with vein and vascular in [place]. She has a history of a left greater saphenous and accessory vein ablations I wonder if there is anything else that can be done from a surgical point of view to help in these difficult refractory wounds. We have previously healed this wound on one occasion but it keeps on reopening [medial side] 6/10; deep tissue culture I did last week I think on the left medial wound showed both moderate E. coli and moderate staph aureus [MSSA]. She is going to require antibiotics and I have chosen Augmentin. We have been using Sorbact and we have made better looking wound surface on both sides but certainly no improvement in wound area. She was back in last Friday apparently for a dressing changes the wrap was hurting her outer left ankle. She has not managed to get a hold of vein and vascular in [place]. We are going to have to make her that appointment 6/17; patient is tolerating the Augmentin. She had an MRI that I think was ordered by orthopedic surgeon this did not show osteomyelitis or an abscess did suggest cellulitis. We have been using Sorbact to the lateral and medial ankles. We have been trying to arrange a follow-up appointment with vein and vascular in [place] or did her original ablations. We apparently an area sent the request to vein and vascular in [facility] 6/24; patient has completed the Augmentin. We do not yet have a vein and vascular appointment in [place]. I am not sure what the issue is here we have asked her to call tomorrow. We are using Sorbact. Making some improvements and especially the medial wound. Both surfaces however look better medial and lateral. 7/1; the patient has been in contact with vein and vascular in [place] but has not yet  received an appointment. Using Sorbact we have gradually improve the wound surface with no improvement in surface area. She is approved for Apligraf but the wound surface still is not completely viable. She has not had to come in for a dressing change 7/8; the patient has an appointment with vein and vascular on 7/31 which is a Friday afternoon. She is concerned about getting back here for [place] to dress her wounds. I think it is important to have them goal for her venous reflux/history of ablations etc. to see if anything else can be done. She apparently tested positive for 1 of the blood tests with regards to lupus and saw a rheumatologist. He has raised the issue of vasculitis again. I have had this thought in the past however the evidence seems overwhelming that this is a venous reflux etiology. If the rheumatologist tells me there is clinical and laboratory investigation is positive for lupus I will rethink this. 7/15; the patient's wound surfaces are quite a bit better. The medial area which was her original wound now has no depth although the lateral wound which was the more recent area actually appears larger. Both with viable surfaces which is indeed better. Using Sorbact. I wanted to use Apligraf on her however there is the issue of the vein and vascular appointment on 7/31 at 2:00 in the afternoon which would not allow her to get back to be rewrapped and they would no doubt remove the graft 7/22; the patient's wound surfaces have moderate amount of debris although generally look better. The lateral one is larger with 2 small satellite areas superiorly. We are waiting for her vein and  vascular appointment on 7/31. She has been approved for Apligraf which I would like to use after th 7/29; wound surfaces have improved no debridement is required we have been using Sorbact. She sees vein and vascular on Friday with this so question of whether anything can be done to lessen the likelihood of  recurrence and/or speed the healing of these areas. She is already had previous ablations. She no doubt has severe venous hypertension 8/5-Patient returns at 1 week, she was in [place] for 3 days by her podiatrist, we have been using so backed to the wound, she has increased pain in both the wounds on the left lower leg especially the more distal one on the lateral aspect 8/12-Patient returns at 1 week and she is agreeable to having debridement in both wounds on her left leg today. We have been using Sorbact, and vascular studies were reviewed at last visit 8/19; the patient arrives with her wounds fairly clean and no debridement is required. We have used Sorbact which is really done a nice job in cleaning up these very difficult wound surfaces. The patient saw Dr. Donzetta Matters of vascular surgery on 7/31. He did not feel that there was an arterial component. He felt that her treated greater saphenous vein is adequately addressed and that the small saphenous vein did not appear to be involved significantly. She was also noted to have deep venous reflux which is not treatable. Dr. Donzetta Matters mentioned the possibility of a central obstructive component leading to reflux and he offered her central venography. She wanted to discuss this or think about it. I have urged her to go ahead with this. She has had recurrent difficult wounds in these areas which do heal but after months in the clinic. If there is anything that can be done to reduce the likelihood of this I think it is worth it. 9/2 she is still working towards getting follow-up with Dr. Donzetta Matters to schedule her CT. Things are quite a bit worse venography. I put Apligraf on 2 weeks ago on both wounds on the medial and lateral part of her left lower leg. She arrives in clinic today with 3 superficial additional wounds above the area laterally and one below the wound medially. She describes a lot of discomfort. I think these are probably wrapped injuries. Does not  look like she has cellulitis. 07/20/2019 on evaluation today patient appears to be doing somewhat poorly in regard to her lower extremity ulcers. She in fact showed signs of erythema in fact we may even be dealing with an infection at this time. Unfortunately I am unsure if this is just infection or if indeed there may be some allergic reaction that occurred as a result of the Apligraf application. With that being said that would be unusual but nonetheless not impossible in this patient is one who is unfortunately allergic to quite a bit. Currently we have been using the Sorbact which seems to do as well as anything for her. I do think we may want to obtain a culture today to see if there is anything showing up there that may need to be addressed. 9/16; noted that last week the wounds look worse in 1 week follow-up of the Apligraf. Using Sorbact as of 2 days ago. She arrives with copious amounts of drainage and new skin breakdown on the back of the left calf. The wounds arm more substantial bilaterally. There is a fair amount of swelling in the left calf no overt DVT there  is edema present I think in the left greater than right thigh. She is supposed to go on 9/28 for CT venography. The wounds on the medial and lateral calf are worse and she has new skin breakdown posteriorly at least new for me. This is almost developing into a circumferential wound area The Apligraf was taken off last week which I agree with things are not going in the right direction a culture was done we do not have that back yet. She is on Augmentin that she started 2 days ago 9/23; dressing was changed by her nurses on Monday. In general there is no improvement in the wound areas although the area looks less angry than last week. She did get Augmentin for MSSA cultured on the 14th. She still appears to have too much swelling in the left leg even with 3 layer compression 9/30; the patient underwent her procedure on 9/28 by Dr.  Donzetta Matters at [facility]. She was discovered to have the common iliac vein measuring 12.2 mm but at the level of L4-L5 measured 3 mm. After stenting it measured 10 mm. 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 Rouleaux flow in the common femoral and femoral vein was observed much improved after stenting. We are using silver alginate to the wounds on the medial and lateral ankle on the left. 4 layer compression 10/7; the patient had fluid swelling around her knee and 4 layer compression. At the advice of vein and vascular this was reduced to 3 layer which she is tolerating better. We have been using silver alginate under 3 layer compression since last Friday 10/14; arrives with the areas on the left ankle looking a lot better. Inflammation in the area also a lot better. She came in for a nurse check on 10/9 10/21; continued nice improvement. Slight improvements in surface area of both the medial and lateral wounds on the left. A lot of the satellite lesions in the weeping erythema around these from stasis dermatitis is resolved. We have been using silver alginate ASHLLEY, BOOHER ([id number]) 10/28; general improvement in the entire wound areas although not a lot of change in dimensions the wound certainly looks better. There is a lot less in terms of venous inflammation. Continue silver alginate this week however look towards Hydrofera Blue next week 11/4; very adherent debris on the medial wound left wound is not as bad. We have been using silver alginate. Change to [facility] today 11/11; very adherent debris on both wound areas. She went to vein and vascular last week and follow-up they put in  [place] on this today. He says the [facility] was adherent. Wound is definitely not as good as last week. Especially on the left there the satellite lesions look more prominent 11/18; absolutely no better. erythema on lateral aspect with tenderness. 09/30/2019 on  evaluation today patient appears to actually be doing better. Dr. Dellia Nims did put her on doxycycline last week which I do believe has helped her at this point. Fortunately there is no signs of active infection at this time. No fevers, chills, nausea, vomiting, or diarrhea. I do believe he may want extend the doxycycline for 7 additional days just to ensure everything does completely cleared up the patient is in agreement with that plan. Otherwise she is going require some sharp debridement today 12/2; patient is completing a 2-week course of doxycycline. I gave her this empirically for inflammation as well as infection when I last saw her  2 weeks ago. All of this seems to be better. She is using silver alginate she has the area on the medial aspect of the larger area laterally and the 2 small satellite regions laterally above the major wound. 12/9; the patient's wound on the left medial and left lateral calf look really quite good. We have been using silver alginate. She saw vein and vascular in follow-up on 10/09/2019. She has had a previous left greater saphenous vein ablation by Dr. Oscar La in 2016. More recently she underwent a left common iliac vein stent by Dr. 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 The swelling is improved and certainly the wounds have improved. The patient shows [place] today area on the right medial calf there is almost no wound but leaking lymphedema. She says she start this started 3 or 4 days ago. She did not traumatize it. It is not painful. She does not wear compression on that side 12/16; the patient continues to do well laterally. Medially still requiring debridement. The area on the right calf did not materialize to anything and is not currently open. We wrapped this last time. She has support stockings for that leg although I am not sure they are going to provide adequate compression 12/23; the lateral wound looks stable. Medially still requiring debridement for  tightly adherent fibrinous debris. We've been using silver alginate. Surface area not any different 12/30; neither wound is any better with regards to surface and the area on the left lateral is larger. I been using silver alginate to the left lateral which look quite good last week and Sorbact to the left medial 11/11/2019. Lateral wound area actually looks better and somewhat smaller. Medial still requires a very aggressive debridement today. We have been using Sorbact on both wound areas 1/13; not much better still adherent debris bilaterally. I been using Sorbact. She has severe venous hypertension. Probably some degree of dermal fibrosis distally. I wonder whether tighter compression might help and I am going to try that today. We also need to work on the bioburden 1/20; using Sorbact. She has severe venous hypertension status post stent placement for pelvic vein compression. We applied gentamicin last time to see if we could reduce bioburden I had some discussion with her today about the use of pentoxifylline. This is occasionally used in this setting for wounds with refractory venous insufficiency. However this interacts with Plavix. She tells me that she was put on this after stent placement for 3 months. She will call Dr. Claretha Cooper office to discuss 1/27; we are using gentamicin under Sorbact. 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 She has a stent. Wound medially is measuring smaller this week. Laterally measuring slightly larger although she has some satellite lesions superiorly 2/3; gentamicin under Sorbact under 4-layer compression. 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 She has a stent on Plavix. Her wounds are measuring smaller this week. More substantially laterally where there is a satellite lesion superiorly. 2/10; gentamicin under Sorbac. 4-layer compression. Patient communicated with Dr. Donzetta Matters at vein and vascular in  [place]. He is okay with the patient coming off Plavix I will therefore start her on pentoxifylline for a 1 month trial. In general her wounds look better today. I had some concerns about swelling in the left thigh however she measures 61.5 on the right and 63 on the mid thigh which does not suggest there is any difficulty. The patient is not describing any pain. 2/17; gentamicin under  Sorbac 4-layer compression. She has been on pentoxifylline for 1 week and complains of loose stool. No nausea she is eating and drinking well 2/24; the patient apparently came in 2 days ago for a nurse visit when her wrap fell down. Both areas look a little worse this week macerated medially and satellite lesions laterally. Change to silver alginate today 3/3; wounds are larger today especially medially. She also has more swelling in her foot lower leg and I even noted some swelling in her posterior thigh which is tender. I wonder about the patency of her stent. Fortuitously she sees Dr. Claretha Cooper group on Friday 3/10; Mrs. Moon was seen by vein and vascular on 3/5. The patient underwent ultrasound. There was no evidence of thrombosis involving the IVC no evidence of thrombosis involving the right common iliac vein there is no evidence of thrombosis involving the right external iliac vein the left external vein is also patent. The right common iliac vein stent appears patent bilateral common femoral veins are compressible and appear patent. I was concerned about the left common iliac stent however it looks like this is functional. She has some edema in the posterior thigh that was tender she still has that this week. I also note they had trouble finding the pulses in her left foot and booked her for an ABI baseline in 4 weeks. She will follow up in 6 months for repeat IVC duplex. The patient stopped the pentoxifylline because of diarrhea. It does not look like that was being effective in any case. I have advised her  to go back on her aspirin 81 mg tablet, vascular it also suggested this 3/17; comes in today with her wound surfaces a lot better. The excoriations from last week considerably better probably secondary to the TCA. We have been using silver alginate 3/24; comes in today with smaller wounds both medially and laterally. Both required debridement. There are 2 small satellite areas superiorly laterally. She also has a very odd bandlike area in the mid calf almost looking like there was a weakness in the wrap in a localized area. I would write this off as being this however anteriorly she has a small raised ballotable area that is very tender almost reminiscent of an abscess but there was no obvious purulent surface to it. 02/04/20 upon evaluation today patient appears to be doing fairly well in regard to her wounds today. Fortunately there is no signs of active infection at this time. No fevers, chills, nausea, vomiting, or diarrhea. She has been tolerating the dressing changes without complication. Fortunately I feel like she is showing signs of improvement although has been sometime since have seen her. Nonetheless the area of concern that Dr. Dellia Nims had last week where she had possibly an area of the wrap that was we can allow the leg to bulge appears to be doing significantly better today there is no signs of anything worsening. HALIMA, FOGAL ([id number]) 4/7; the patient's wounds on her medial and lateral left leg continue to contract. We have been using a regular alginate. Last week she developed an area on the right medial lower leg which is probably a venous ulcer as well. 4/14; the wounds on her left medial and lateral lower leg continue to contract. Surface eschar. We have been using regular alginate. The area on the right medial lower leg is closed. We have been putting both legs under 4-layer contraction. The patient went back to see vein and vascular she had arterial studies done  which  were apparently "quite good" per the patient although I have not read their notes I have never felt she had an arterial issue. 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 This is been longstanding and refractory to exercise, leg elevation and longstanding use of compression wraps in our clinic as well as compression stockings on the times we have been able to get these to heal 4/21; we thought she actually might be close this week however she arrives in clinic with a lot of edema in her upper left calf and into her posterior thigh. This is been an intermittent problem here. She says the wrap fell down but it was replaced with a nurse visit on Monday. We are using calcium alginate to the wounds and the wound sizes there not terribly larger than last week but there is a lot more edema 4/28; again wound edges are smaller on both sides. Her edema is better controlled than last time. She is obtained her compression pumps from [employer] although they have not been to her home to set these up. 5/5; left medial and left lateral both look stable. I am not sure the medial is any smaller. We have been using calcium alginate under 4-layer compression. oShe had an area on the right medial. This was eschared today. We have been wrapping this as well. She does not tolerate external compression stockings due to a history of various contact allergies. She has her compression pumps however the representative from the company is coming on her to show her how to use these tomorrow 5/19; patient with severe chronic venous insufficiency secondary to central venous disease. She had a stent placed in her left common iliac vein. She has done better since but still difficult to control wounds. She comes in today with nothing open on the right leg. Her areas on the left medial and left lateral are just about closed. We are using calcium alginate under 4-layer compression.  She is using her external compression pumps at home She only has 15-20 support stockings. States she cannot get anything tighter than that on. 03/30/20-Patient returns at 1 week, the wounds on the left leg are both slightly bigger, the last week she was on 3 layer compression which started to slide down. She is starting to use her lymphedema pumps although she stated on 1 day her right ankle started to swell up and she have to stop that day. Unfortunately the open area seem to oscillate between improving to the point of healing and then flaring up all to do with effectiveness of compression or lack of due to the left leg topography not keeping the compression wraps from rolling down 6/2 patient comes in with a 15/20 mmHg stocking on the right leg. She tells me that she developed a lot of swelling in her ankles she saw orthopedics she was felt to possibly be having a flare of pseudogout versus some other type of arthritis. She was put on steroids for a respiratory issue so that helps with the inflammation. She has not been using the pumps all week. She thinks the left thigh is more swollen than usual and I would agree with that. She has an appointment with Dr. Donzetta Matters 9 days or so from now 6/9; both wounds on the left medial and left lateral are smaller. We have been using calcium alginate under compression. She does not have an open wound on the right leg she is using a stocking and her  compression pumps things are going well. She has an appointment with Dr. Donzetta Matters with regards to her stent in the left common iliac vein 6/16; the wounds on the left medial and left lateral ankle continues to contract. The patient saw Dr. Donzetta Matters and I think he seems satisfied. Ordered follow-up venous reflux studies on both sides in September. Cautioned that she may need thigh-high stockings. She has been using calcium alginate under compression on the left and her own stocking on the right leg. She tells [place] there are no open  wounds on the right 6/23; left lateral is just about closed. Medial required debridement today. We have been using calcium alginate. Extensive discussion about the compression pumps she is only using these on 25 mmHg states she could not take 40 or 30 when the wrap came out to her home to demonstrate these. He said they should not feel tight 6/30; the left lateral wound has a slight amount of eschar. . The area medially is about the same using Hydrofera Blue. 7/7; left lateral wound still has some eschar. I will remove this next week may be closed. The area medially is very small using Hydrofera Blue with improvement. Unfortunately the stockings fell down. Unfortunately the blisters have developed at the edge of where the wrap fell. When this happened she says her legs hurt she did not use her pumps. We are not open Monday for her to come in and change the wraps and she had an appointment yesterday. She also tells me that she is going to have an MRI of her back. She is having pain radiating into her left anterior leg she thinks her from an L5 disc. She saw Dr. Ellene Route of neurosurgery 7/14; the area on the left lateral ankle area is closed. Still a small area medially however it looks better as well. We have been using Hydrofera Blue under 4-layer compression 7/21; left lateral ankle is still closed however her wound on the medial left calf is actually larger. This is probably because Hydrofera Blue got stuck to the wound. She came in for a nurse change on Friday and will do that again this week I was concerned about the amount of swelling that she had last week however she is using her compression pumps twice a day and the swelling seems well controlled 7/28; remaining wound on the left medial lower leg is smaller. We have been using moistened silver collagen under compression she is coming back for a nurse visit. For reasons that were not really clear she was just keeping her legs elevated and not  using her compression pumps. I have asked her to use the compression pumps. She does not have any wounds on the right leg 06/15/20-Patient returns at 2 weeks, her LLE edema is worse and she developed a blister wound that is new and has bigger posterior calf wound on right, we are using Prisma with pad, 4 layer compression. she has been on lasix 40 mg daily 8/18; patient arrives today with things a lot worse than I remember from a few weeks ago. She was seen last week. Noted that her edema was worse and that she now had a left lateral wound as well as deteriorating edema in the medial and posterior part of the lower leg. She says she is using his or her external compression pumps once a day although I wonder about the compliance. 8/25; weeping area on the right medial lower leg. This had actually gotten a small localized area  of her compression stocking wet. oOn the left side there is a large denuded area on the posterior medial lower leg and smaller area on the lateral. This was not the original areas that we dealt with. 9/1 the patient's wound on the left leg include the left lateral and left posterior. Larger superficial wounds weeping. She has very poor edema control. Tender localized edema in the left lower medial ankle/heel probably because of localized wrap issues. She freely admits she is not using the compression pumps. She has been up on her feet a lot. She thinks the hydrofera blue is contributing to the pain she is experiencing.. This is a complaint that I have occasionally heard 9/8; really not much improvement. The patient is still complaining of a lot of pain particularly when she uses compression pumps. I switched her to silver alginate last time because she found the Hydrofera Blue to be irritating. I don't hear much difference in her description with the silver alginate. 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 She has follow-up with Dr. Donzetta Matters I think for ultrasound next month RAYYAN, ORSBORN ([id number]) 9/15; quite a bit of improvement today. We have less edema and more epithelialization in both of her wound areas on the left medial and left lateral calf. These are not the site of her original wounds in this area. She says she has been using her compression pumps for 30 minutes twice a day, there pain issues that never quite understood. Silver alginate as the primary dressing 9/22; continued improvement. Both areas medially and laterally still have a small open area there is some eschar. She continues to complain of left medial ankle pain. Swelling in the leg is in much better condition. We have been using silver alginate 9/29; continued improvement. Both areas medially and laterally in the left calf look as though they are close some minor surface eschar but I think this is epithelialized. She comes in today saying she has a ruptured disc at L4-L5 cannot bend over to put on her stockings. 10/6; patient comes in today with no open wounds on either leg. However her edema on the left leg in the upper one third of the lower leg is poorly controlled nonpitting. She says that she could not use the pumps for 2 days and then she has been using the last couple of days. It is not clear to me she has been able to get her stocking on. She has back problems. Mrs. [name] has severe chronic venous insufficiency with secondary lymphedema. Her venous insufficiency is partially centrally mediated and that she is now post stent in the left common iliac vein. [facility] Thurner's syndrome/physiology]. She follows up with them on 10/15. She wears 20/30 below-knee stockings. She is supposed to use compression pumps at home although I think her compliance about with this is been less than 100%. I have asked her to use these 3 times a day. Finally I think she has lipodermatosclerosis in the left lower leg with an inverted  bottle sign. It is been a major problem controlling the edema in the left leg. The right leg we have had wounds on but not as significant a problem is on the left READMISSION 04/12/2021 Mrs. Chipman is a 76 year old woman we know well in this clinic. She has severe chronic venous insufficiency. 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 I believe she has had bilateral greater saphenous vein ablation in the past as well. She tells me that this wound opened sometime in March. She had a fall and thinks it was initially abrasion. She developed areas she describes as little blisters on the anterior part of her leg and she saw dermatology and was treated for methicillin staph aureus with several rounds of antibiotics. She has been using support stockings on the left leg and says this is the only thing she can get on. Her compression pump use maybe once a day she says if she did not use one she use the other. She comes in today with incredible swelling in the left leg with a wound on the left posterior calf. She has been using Neosporin to this previously a hydrocolloid. 6/15; patient arrives back for 1 week follow-up.Marland Kitchen Apparently her wrap fell down she did not call us to replace this. He has poor edema control. She only uses her compression pumps once a day 6/29; patient presents for 1 week follow-up. She has tolerated the compression wrap well. We have been using Iodoflex under the wrap. She has no issues or complaints today. 7/6; patient presents for 1 week follow-up. She states that the compression wrap rolled down her leg 4 days ago. She has been trying to keep the area covered but has no dressings at home to use. She denies signs of infection. 7/13; patient presents for 1 week follow-up. She states that her compression wrap rolled down her right leg and she called our office and had it placed a few days ago. She has been tolerating the current wrap well. She  states that the Iodoflex is causing a burning sensation. She denies signs of infection. 7/27; patient presents for 1 week follow-up She has tolerated the compression wrap well with Hydrofera Blue underneath. She has now developed a wound to her right lower extremity. She reports having a culture done To this area by her dermatologist that she reports is negative. She currently denies signs of infection. 8/30; patient presents for 1 week follow-up. On the left side she has tolerated the compression wrap well with Hydrofera Blue underneath. She reports some discomfort to her right lower extremity with the 3 layer compression. She currently denies signs of infection. 06/14/2021 upon evaluation today patient's wound actually appears to be doing decently well based on what I am seeing currently. She actually has 2 areas 1 on the left distal/posterior lower leg and the other on the right lower leg. Subsequently the measurements are roughly the same may be slightly smaller but in general have not made any trend towards getting worse which is great news. 06/23/2021 upon evaluation today patient appears to be doing pretty well in regard to her wounds. On the right she is having difficulty with sciatic pain and this subsequently has led to her taking the wrap off over the last week her leg is more swollen she is also been on prednisone for 10 days. With that being said I think we may want to just use a compression sock on the left that way she will not have to worry about the compression wrap being in place that she cannot get off. With that being said I do believe as well that the patient is going to need to continue with the wrapping on the left we will also need to do a little bit of sharp debridement today. 8/24; patient presents for 1 week follow-up. She has no issues  or complaints today. She denies signs of infection. 8/31; patient presents for 1 week follow-up. She has no issues or complaints today. She  has tolerated the compression wrap well on the left lower extremity. She denies signs of infection. 9/7; patient presents for follow-up. She reports that the compression wrap rolled down slightly on her right lower extremity. She reports tenderness to the wound bed. She denies increased warmth or erythema to the surrounding skin. She overall feels well. 9/14; patient presents for follow-up. She has no issues or complaints today. She denies signs of infection. PuraPly is available and patient would like to start this today. 9/21; patient presents for follow-up. She has no issues or complaints today. She tolerated the first skin substitute placement last week under compression. 9/28; patient presents for 1 week follow-up. She has no issues or complaints today. 10/5; patient presents for 1 week follow-up. She reports taking the wrap off yesterday. She was on her feet more yesterday and developed more swelling to her left lower extremity. She denies pain. Overall she is doing well. 10/12; patient presents for 1 week follow-up. She has no issues or complaints today. 10/19; patient presents for follow-up. She has no issues or complaints today. She tolerated the compression wrap well. She states she has compression stockings at home. OLENA, WILLY ([id number]) Readmission: 11/27/2021 upon evaluation today patient appears to be doing poorly in regard to the wounds on her right leg. She also has an area of erythema in the proximal calf area of the right leg which again I do believe is a issue from the standpoint of it being warm and erythematous to touch and also seems to be somewhat localized I really think this may be more of a cellulitis issue than anything else. Fortunately I do not see any signs of active infection Systemically at this time. 12/05/2021 upon evaluation patient's wounds actually appear to be showing some signs of improvement which is not too bad and just 1 week's time. Especially  since we had to get on the right antibiotic which is only been for the past 4 days. Fortunately I do not see any signs of active infection locally nor systemically at this time that has gotten any worse. Overall I think that again with the antibiotic she is significantly improved and currently she is taking Levaquin. 12/11/2021 upon evaluation today patient appears to be doing well with regard to her leg. Between the antibiotics and the triamcinolone I feel like she is doing significantly better which is great news. Overall I am extremely pleased with where we stand. There does not appear to be any signs of active infection locally or systemically at this time. 12/18/2021 upon evaluation today patient appears to be doing well with regard to her wound. I do feel like this is showing signs of some improvement here. Little by little this is clearing up quite nicely. I think it is just a slow process but nonetheless 1 that we are seeing evidence of improvement with regard to. 12/25/2021 upon evaluation today patient appears to be doing well with regard to her wounds she is definitely making some good progress here. Fortunately I do not see any signs of active infection locally or systemically at this time which is great news. No fevers, chills, nausea, vomiting, or diarrhea. 01/02/2022 upon evaluation today patient appears to be doing well with regard to her wound. She has been tolerating the dressing changes without complication. Fortunately I do not see any evidence of active infection  locally or systemically which is great news and overall I am extremely pleased with where we stand today. 01/08/2022 upon evaluation today patient appears to be doing better in regard to her ankle ulcers. She has been tolerating the dressing changes without complication. Fortunately I do not see any signs of infection and both the ankle and leg area is making great progress. 3/13; patient presents for follow-up. She has no  issues or complaints today. She was tolerated the compression wrap well. 01/19/2022 upon evaluation today patient appears to be doing well currently in regard to her right leg which overall I think is doing excellent. She had called in to try to get her in sooner for a dressing change due to an issue where her cat scratched her on the left leg. With that being said it does not appear that this was actually a scratch but rather that it was a puncture over a small varicose vein. She had a tremendous amount of bleeding as evidenced by the amount of blood in her shoes currently. The good news is this is stopped currently the bad news is that she needs to be very careful with regard to her cats later as this could definitely be a significant problem for her. Fortunately I do not see any signs of active infection locally nor systemically at this point. 02-08-2022 on evaluation today patient actually appears to be doing significantly better. She was in the hospital due to having stepped on attack on her left heel but nonetheless this is actually showing signs of improvement at this point. I do not see any evidence of active infection locally or systemically which is great news and overall I am extremely pleased with where we stand today. Her right leg is showing no signs of infection. She has been on antibiotics while in the hospital and at discharge. 02-15-2022 upon evaluation today patient appears to be doing better in regard to her wounds. Overall I feel like we are headed in the right direction. With that being said she does have an area of irritation posteriorly that was not there last week again that makes this wound posterior look little bigger but overall things are significantly better. I am very pleased in that regard. 02-23-2022 upon evaluation today patient appears to be doing well with regard to her wound. It is doing a little bit worse only and the fact that it is dry other than that things seem to  be making good improvements here. There does not appear to be any signs of active infection at this time which is great news. 4/27; right lateral leg 4-5 small open areas with eschar on top. I used a curette to remove this. She has been using Xeroform under 3 layer compression. 03-08-2022 upon evaluation today patient appears to be doing well with regard to the wounds on her lower extremity. She has been tolerating the dressing changes without complication. Fortunately there does not appear to be any signs of active infection at this time which is great news. No fevers, chills, nausea, vomiting, or diarrhea. 03-15-2022 upon evaluation today patient appears to be doing well currently in regard to her wounds. She has been tolerating the dressing changes without complication. Fortunately there does not appear to be any evidence of active infection locally nor systemically at this time which is great news. I do believe she is getting very close to resolution. 03-22-2022 upon evaluation today patient actually appears to be doing awesome in fact she is has 1 very  tiny area immediately left open the rest of this appears to be almost completely closed. I do not see any evidence of infection locally or systemically which is great news and overall I am extremely pleased with where things stand at this point. I do think we are getting very close to complete resolution. 03-29-2022 upon evaluation today patient appears to be doing well currently in regard to her wound. In fact this appears to be almost completely healed. I think that if she still continues to have no significant drainage come next week that we will probably get a go ahead and heal her out at that point. Electronic Signature(s) Signed: 03/29/2022 5:19:40 PM By: Worthy Keeler [name]-C Previous Signature: 03/29/2022 5:19:24 PM Version By: Worthy Keeler [name]-C Entered By: Worthy Keeler on 03/29/2022 17:19:40 Grand, JOANI COSMA ([id number]) [name], [name] ([id number]) -------------------------------------------------------------------------------- Physical Exam Details Patient Name: MAKAYLIN, [name]. Date of Service: 03/29/2022 1:45 PM Medical Record Number: [id number] Patient Account Number: [id number] Date of Birth/Sex: [name] 04, 1946 (76 y.o. F) Treating RN: Levora Dredge Primary Care Provider: [name] Other Clinician: Referring Provider: [name] Treating Provider/Extender: [name] in Treatment: 44 Constitutional [name] and well-hydrated in no acute distress. Respiratory normal breathing without difficulty. Psychiatric this patient is able to make decisions and demonstrates good insight into disease process. Alert and Oriented x 3. pleasant and cooperative. Notes Excellent granulation epithelization in fact I am not even sure there is much open there may be a pinpoint area on the medial aspect everything else is completely closed and looks awesome. Electronic Signature(s) Signed: 03/29/2022 5:20:06 PM By: Worthy Keeler [name]-C Entered By: Worthy Keeler on 03/29/2022 17:20:06 ZOANNE, NEWILL ([id number]) -------------------------------------------------------------------------------- Physician Orders Details Patient Name: LAUREN, MODISETTE. Date of Service: 03/29/2022 1:45 PM Medical Record Number: [id number] Patient Account Number: [id number] Date of Birth/Sex: 1946-06-29 (76 y.o. F) Treating RN: Levora Dredge Primary Care Provider: [name] Other Clinician: Referring Provider: [name] Treating Provider/Extender: [name] in Treatment: 17 Verbal / Phone Orders: No Diagnosis Coding ICD-10 Coding Code Description I89.0 Lymphedema, not elsewhere classified I87.331 Chronic venous hypertension (idiopathic) with ulcer and inflammation of right lower extremity L97.811 Non-pressure chronic ulcer of other part of right lower leg limited to breakdown of skin Follow-up  Appointments o Return Appointment in 1 week. o Nurse Visit as needed Bathing/ Shower/ Hygiene o May shower with wound dressing protected with water repellent cover or cast protector. o No tub bath. Anesthetic (Use 'Patient Medications' Section for Anesthetic Order Entry) o Lidocaine applied to wound bed Edema Control - Lymphedema / Segmental Compressive Device / Other o Optional: One layer of unna paste to top of compression wrap (to act as an anchor). - she needs this o Elevate, Exercise Daily and Avoid Standing for Long Periods of Time. o Elevate leg(s) parallel to the floor when sitting. o DO YOUR BEST to sleep in the bed at night. DO NOT sleep in your recliner. Long hours of sitting in a recliner leads to swelling of the legs and/or potential wounds on your backside. Additional Orders / Instructions o Follow Nutritious Diet and Increase Protein Intake Wound Treatment Wound #16 - Lower Leg Wound Laterality: Right, Medial Cleanser: Soap and Water 1 x Per Week/15 Days Discharge Instructions: Gently cleanse wound with antibacterial soap, rinse and pat dry prior to dressing wounds Cleanser: Wound Cleanser 1 x Per Week/15 Days Discharge Instructions: Wash your hands with soap and water. Remove old dressing, discard into plastic  bag and place into trash. Cleanse the wound with Wound Cleanser prior to applying a clean dressing using gauze sponges, not tissues or cotton balls. Do not scrub or use excessive force. Pat dry using gauze sponges, not tissue or cotton balls. Primary Dressing: Xeroform-HBD 2x2 (in/in) 1 x Per Week/15 Days Discharge Instructions: Apply Xeroform-HBD 2x2 (in/in) as directed Secondary Dressing: Gauze 1 x Per Week/15 Days Compression Wrap: 3-LAYER WRAP - Profore Lite LF 3 Multilayer Compression Bandaging System 1 x Per Week/15 Days Discharge Instructions: Apply 3 multi-layer wrap as prescribed. Electronic Signature(s) Signed: 03/29/2022 4:20:08 PM By:  Levora Dredge Signed: 03/30/2022 4:54:58 PM By: Worthy Keeler [name]-C Entered By: Levora Dredge on 03/29/2022 14:20:49 LINDIA, [name] ([id number]) -------------------------------------------------------------------------------- Problem List Details Patient Name: [name], [name]. Date of Service: 03/29/2022 1:45 PM Medical Record Number: [id number] Patient Account Number: [id number] Date of Birth/Sex: 02-[name]-1946 (76 y.o. F) Treating RN: Levora Dredge Primary Care Provider: [name] Other Clinician: Referring Provider: [name] Treating Provider/Extender: [name] in Treatment: 17 Active Problems ICD-10 Encounter Code Description Active Date MDM Diagnosis I89.0 Lymphedema, not elsewhere classified 11/27/2021 No Yes I87.331 Chronic venous hypertension (idiopathic) with ulcer and inflammation of 11/27/2021 No Yes right lower extremity L97.811 Non-pressure chronic ulcer of other part of right lower leg limited to 11/27/2021 No Yes breakdown of skin Inactive Problems Resolved Problems Electronic Signature(s) Signed: 03/29/2022 1:36:38 PM By: Worthy Keeler [name]-C Entered By: Worthy Keeler on 03/29/2022 13:36:38 Antonson, [name] ([id number]) -------------------------------------------------------------------------------- Progress Note Details Patient Name: SHALAN, [name]. Date of Service: 03/29/2022 1:45 PM Medical Record Number: [id number] Patient Account Number: [id number] Date of Birth/Sex: 1946-08-29 (76 y.o. F) Treating RN: Levora Dredge Primary Care Provider: [name] Other Clinician: Referring Provider: [name] Treating Provider/Extender: [name] in Treatment: 17 Subjective Chief Complaint Information obtained from Patient Right LE Ulcer History of Present Illness (HPI) Pleasant 76 year old with history of chronic venous insufficiency. No diabetes or peripheral vascular disease. Left ABI 1.29. Questionable history of  left lower extremity DVT. She developed a recurrent ulceration on her left lateral calf in December 2015, which she attributes to poor diet and subsequent lower extremity edema. She underwent endovenous laser ablation of her left greater saphenous vein in 2010. She underwent laser ablation of accessory branch of left GSV in April 2016 by Dr. Kellie Simmering at [facility]. She was previously wearing Unna boots, which she tolerated well. Tolerating 2 layer compression and cadexomer iodine. She returns to clinic for follow-up and is without new complaints. She denies any significant pain at this time. She reports persistent pain with pressure. No claudication or ischemic rest pain. No fever or chills. No drainage. READMISSION 11/13/16; this is a 76 year old woman who is not a diabetic. She is here for a review of a painful area on her left medial lower extremity. I note that she was seen here previously last year for wound I believe to be in the same area. At that time she had undergone previously a left greater saphenous vein ablation by Dr. Kellie Simmering and she had a ablation of the anterior accessory branch of the left greater saphenous vein in March 2016. Seeing that the wound actually closed over. In reviewing the history with her today the ulcer in this area has been recurrent. She describes a biopsy of this area in 2009 that only showed stasis physiology. She also has a history of today malignant melanoma in the right shoulder for which she follows with Dr. 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 Do not see that she has ever had arterial studies in the left leg. She tells me she has a follow-up with Dr. Kellie Simmering in roughly 10 days In any case she developed the reopening of this area roughly a month ago. On the background of this she describes rapidly increasing edema which has responded to Lasix  40 mg and metolazone 2.5 mg as well as the patient's lymph massage. She has been told she has both venous insufficiency and lymphedema but she cannot tolerate compression stockings 11/28/16; the patient saw Dr. Kellie Simmering recently. Per the patient he did arterial Dopplers in the office that did not show evidence of arterial insufficiency, per the patient he stated "treat this like an ordinary venous ulcer". She also saw her dermatologist Dr. Ronnald Ramp who felt that this was more of a vascular ulcer. In general things are improving although she arrives today with increasing bilateral lower extremity edema with weeping a deeper fluid through the wound on the left medial leg compatible with some degree of lymphedema 12/04/16; the patient's wound is fully epithelialized but I don't think fully healed. We will do another week of depression with Promogran and TCA however I suspect we'll be able to discharge her next week. This is a very unusual-looking wound which was initially a figure-of-eight type wound lying on its side surrounded by petechial like hemorrhage. She has had venous ablation on this side. She apparently does not have an arterial issue per Dr. Kellie Simmering. She saw her dermatologist thought it was "vascular". Patient is definitely going to need ongoing compression and I talked about this with her today she will go to elastic therapy after she leaves here next week 12/11/16; the patient's wound is not completely closed today. She has surrounding scar tissue and in further discussion with the patient it would appear that she had ulcers in this area in 2009 for a prolonged period of time ultimately requiring a punch biopsy of this area that only showed venous insufficiency. I did not previously pickup on this part of the history from the patient. 12/18/16; the patient's wound is completely epithelialized. There is no open area here. She has significant bilateral venous insufficiency with secondary lymphedema to a  mild-to-moderate degree she does not have compression stockings.. She did not say anything to me when I was in the room, she told our intake nurse that she was still having pain in this area. This isn't unusual recurrent small open area. She is going to go to elastic therapy to obtain compression stockings. 12/25/16; the patient's wound is fully epithelialized. There is no open area here. The patient describes some continued episodic discomfort in this area medial left calf. However everything looks fine and healed here. She is been to elastic therapy and caught herself 15-20 mmHg stockings, they apparently were having trouble getting 20-30 mm stockings in her size 01/22/17; this is a patient we discharged from the clinic a month ago. She has a recurrent open wound on her medial left calf. She had 15 mm support stockings. I told her I thought she needed 20-30 mm compression stockings. She tells me that she has been ill with hospitalization secondary to asthma and is been found to have severe hypokalemia likely secondary to a combination of Lasix and metolazone. This morning she noted blistering and leaking fluid on the posterior part of  her left leg. She called our intake nurse urgently and we was saw her this afternoon. She has not had any real discomfort here. I don't know that she's been wearing any stockings on this leg for at least 2-3 days. ABIs in this clinic were 1.21 on the right and 1.3 on the left. She is previously seen vascular surgery who does not think that there is a peripheral arterial issue. 01/30/17; Patient arrives with no open wound on the left leg. She has been to elastic therapy and obtained 20-36mhg below knee stockings and she has one on the right leg today. READMISSION 02/19/18; this PSwickardis a now 76year old patient we've had in this clinic perhaps 3 times before. I had last looked at her from January 07 December 2016 with an area on the medial left leg. We discharged her on  12/25/16 however she had to be readmitted on 01/22/17 with a recurrence. I have in my notes that we discharged her on 20-30 mm stockings although she tells me she was only wearing support hose because she cannot get stockings on predominantly related to her cervical spine surgery/issues. [place] the [place] with an anterior accessory branch ablation I think both of these were in 2016. On one of the previous visit she had a biopsy noted 2009 that was negative. She is not felt to have an arterial issue. She is not a diabetic. She does have a history of obstructive sleep apnea hypertension asthma as well as chronic venous insufficiency and lymphedema. PLAWSYN, HEILER([id number] On this occasion she noted 2 dry scaly patch on her left leg. She tried to put lotion on this it didn't really help. There were 2 open areas.the patient has been seeing her primary physician from 02/05/18 through 02/14/18. She had Unna boots applied. The superior wound now on the lateral left leg has closed but she's had one wound that remains open on the lateral left leg. This is not the same spot as we dealt with in 2018. ABIs in this clinic were 1.3 bilaterally 02/26/18; patient has a small wound on the left lateral calf. Dimensions are down. She has chronic venous insufficiency and lymphedema. 03/05/18; small open area on the left lateral calf. Dimensions are down. Tightly adherent necrotic debris over the surface of the wound which was difficult to remove. Also the dressing [over collagen] stuck to the wound surface. This was removed with some difficulty as well. Change the primary dressing to Hydrofera Blue ready 03/12/18; small open area on the left lateral calf. Comes in with tightly adherent surface eschar as well as some adherent Hydrofera Blue. 03/19/18; open area on the left lateral calf. Again adherent surface eschar as  well as some adherent Hydrofera Blue nonviable subcutaneous tissue. She complained of pain all week even with the reduction from 4-3 layer compression I put on last week. Also she had an increase in her ankle and calf measurements probably related to the same thing. 03/26/18; open area on the left lateral calf. A very small open area remains here. We used silver alginate starting last week as the Hydrofera Blue seem to stick to the wound bed. In using 4-layer compression 04/02/18; the open area in the left lateral calf at some adherent slough which I removed there is no open area here. We are able to transition her into her own compression stocking. Truthfully I think this is  probably his support hose. However this does not maintain skin integrity will be limited. She cannot put over the toe compression stockings on because of neck problems hand problems etc. She is allergic to the lining layer of juxta lites. We might be forced to use extremitease stocking should this fail READMIT 11/24/2018 Patient is now a 76 year old woman who is not a diabetic. She has been in this clinic on at least 3 previous occasions largely with recurrent wounds on her left leg secondary to chronic venous insufficiency with secondary lymphedema. Her situation is complicated by inability to get stockings on and an allergy to neoprene which is apparently a component and at least juxta lites and other stockings. As a result she really has not been wearing any stockings on her legs. She tells [place] that roughly 2 or 3 weeks ago she started noticing a stinging sensation just above her ankle on the left medial aspect. She has been diagnosed with pseudogout and she wondered whether this was what she was experiencing. She tried to dress this with something she bought at the store however subsequently it pulled skin off and now she has an open wound that is not improving. She has been using Vaseline gauze with a cover bandage. She saw  her primary doctor last week who put an Haematologist on her. ABIs in this clinic was 1.03 on the left 2/12; the area is on the left medial ankle. Odd-looking wound with what looks to be surface epithelialization but a multitude of small petechial openings. This clearly not closed yet. We have been using silver alginate under 3 layer compression with TCA 2/19; the wound area did not look quite as good this week. Necrotic debris over the majority of the wound surface which required debridement. She continues to have a multitude of what looked to be small petechial openings. She reminds [place] that she had a biopsy on this initially during her first outbreak in 2015 in [place] dermatology. She expresses concern about this being a possible melanoma. 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 I assured her that this does not look anything like melanoma. Except for the petechial reaction it does look like a venous insufficiency area and she certainly has evidence of this on both sides 2/26; a difficult area on the left medial ankle. The patient clearly has chronic venous hypertension with some degree of lymphedema. The odd thing about the area is the small petechial hemorrhages. I am not really sure how to explain this. This was present last time and this is not a compression injury. We have been using Hydrofera Blue which I changed to last week 3/4; still using Hydrofera Blue. Aggressive debridement today. She does not have known arterial issues. She has seen Dr. Kellie Simmering at [facility] vein [facility] vascular and and has an ablation on the left. [Anterior accessory branch of the greater saphenous]. From what I remember they did not feel she had an arterial issue. The patient has had this area biopsied in 2009 at [facility] and by her recollection they said this was "stasis". She is also follow-up with dermatology  locally who thought that this was more of a vascular issue 3/11; using Hydrofera Blue. Aggressive debridement today. She does not have an arterial issue. We are using 3 layer compression although we may need to go to 4. The patient has been in for multiple changes to her wrap since I  last saw her a week ago. She says that the area was leaking. I do not have too much more information on what was found 01/19/19 on evaluation today patient was actually being seen for a nurse visit when unfortunately she had the area on her left lateral lower extremity as well as weeping from the right lower extremity that became apparent. Therefore we did end up actually seeing her for a full visit with myself. She is having some pain at this site as well but fortunately nothing too significant at this point. No fevers, chills, nausea, or vomiting noted at this time. 3/18-Patient is back to the clinic with the left leg venous leg ulcer, the ulcer is larger in size, has a surface that is densely adherent with fibrinous tissue, the Hydrofera Blue was used but is densely adherent and there was difficulty in removing it. The right lower extremity was also wrapped for weeping edema. Patient has a new area over the left lateral foot above the malleolus that is small and appears to have no debris with intact surrounding skin. Patient is on increased dose of Lasix also as a means to edema management 3/25; the patient has a nonhealing venous ulcer on the medial left leg and last week developed a smaller area on the lateral left calf. We have been using Hydrofera Blue with a contact layer. 4/1; no major change in these wounds areas. Left medial and more recently left lateral calf. I tried Iodoflex last week to aid in debridement she did not tolerate this. She stated her pain was terrible all week. She took the top layer of the 4 layer compression off. 4/8; the patient actually looks somewhat better in terms of her more prominent  left lateral calf wound. There is some healthy looking tissue here. She is still complaining of a lot of discomfort. 4/15; patient in a lot of pain secondary to sciatica. She is on a prednisone taper prescribed by her primary physician. She has the 2 areas one on the left medial and more recently a smaller area on the left lateral calf. Both of these just above the malleoli 4/22; her back pain is better but she still states she is very uncomfortable and now feels she is intolerant to the [employer]. No real change in the wounds we have been using Sorbact. She has been previously intolerant to Iodoflex. There is not a lot of option about what we can use to debride this wound under compression that she no doubt needs. sHe states Ultram no longer works for her pain 4/29; no major change in the wounds slightly increased depth. Surface on the original medial wound perhaps somewhat improved however the more recent area on the lateral left ankle is 100% covered in very adherent debris we have been using Sorbact. She tolerates 4 layer compression well and her edema control is a lot better. She has not had to come in for a nurse check 5/6; no major change in the condition of the wounds. She did consent to debridement today which was done with some difficulty. Continuing Sorbact. She did not tolerate Iodoflex. She was in for a check of her compression the day after we wrapped her last week this was adjusted but nothing much was found 5/13; no major change in the condition or area of the wounds. I was able to get a fairly aggressive debridement done on the lateral left leg [place] ([id number]) wound. Even using Sorbact under compression. She came back  in on Friday to have the wrap changed. She says she felt uncomfortable on the lateral aspect of her ankle. She has a long history of chronic venous insufficiency including previous ablation surgery on this side. 5/20-Patient returns for wounds on left leg  with both wounds covered in slough, with the lateral leg wound larger in size, she has been in 3 layer compression and felt more comfortable, she describes pain in ankle, in leg and pins and needles in foot, and is about to try Pamelor for this 6/3; wounds on the left lateral and left medial leg. The area medially which is the most recent of the 2 seems to have had the largest increase in dimensions. We have been using Sorbac to try and debride the surface. She has been to see orthopedics they apparently did a plain x-ray that was indeterminant. Diagnosed her with neuropathy and they have ordered an MRI to determine if there is underlying osteomyelitis. This was not high on my thought list but I suppose it is prudent. We have advised her to make an appointment with vein and vascular in [place]. She has a history of a left greater saphenous and accessory vein ablations I wonder if there is anything else that can be done from a surgical point of view to help in these difficult refractory wounds. We have previously healed this wound on one occasion but it keeps on reopening [medial side] 6/10; deep tissue culture I did last week I think on the left medial wound showed both moderate E. coli and moderate staph aureus [MSSA]. She is going to require antibiotics and I have chosen Augmentin. We have been using Sorbact and we have made better looking wound surface on both sides but certainly no improvement in wound area. She was back in last Friday apparently for a dressing changes the wrap was hurting her outer left ankle. She has not managed to get a hold of vein and vascular in [place]. We are going to have to make her that appointment 6/17; patient is tolerating the Augmentin. She had an MRI that I think was ordered by orthopedic surgeon this did not show osteomyelitis or an abscess did suggest cellulitis. We have been using Sorbact to the lateral and medial ankles. We have been trying to arrange a  follow-up appointment with vein and vascular in [place] or did her original ablations. We apparently an area sent the request to vein and vascular in [facility] 6/24; patient has completed the Augmentin. We do not yet have a vein and vascular appointment in [place]. I am not sure what the issue is here we have asked her to call tomorrow. We are using Sorbact. Making some improvements and especially the medial wound. Both surfaces however look better medial and lateral. 7/1; the patient has been in contact with vein and vascular in [place] but has not yet received an appointment. Using Sorbact we have gradually improve the wound surface with no improvement in surface area. She is approved for Apligraf but the wound surface still is not completely viable. She has not had to come in for a dressing change 7/8; the patient has an appointment with vein and vascular on 7/31 which is a Friday afternoon. She is concerned about getting back here for [place] to dress her wounds. I think it is important to have them goal for her venous reflux/history of ablations etc. to see if anything else can be done. She apparently tested positive for 1 of the blood tests with regards  to lupus and saw a rheumatologist. He has raised the issue of vasculitis again. I have had this thought in the past however the evidence seems overwhelming that this is a venous reflux etiology. If the rheumatologist tells me there is clinical and laboratory investigation is positive for lupus I will rethink this. 7/15; the patient's wound surfaces are quite a bit better. The medial area which was her original wound now has no depth although the lateral wound which was the more recent area actually appears larger. Both with viable surfaces which is indeed better. Using Sorbact. I wanted to use Apligraf on her however there is the issue of the vein and vascular appointment on 7/31 at 2:00 in the afternoon which would not allow her to  get back to be rewrapped and they would no doubt remove the graft 7/22; the patient's wound surfaces have moderate amount of debris although generally look better. The lateral one is larger with 2 small satellite areas superiorly. We are waiting for her vein and vascular appointment on 7/31. She has been approved for Apligraf which I would like to use after th 7/29; wound surfaces have improved no debridement is required we have been using Sorbact. She sees vein and vascular on Friday with this so question of whether anything can be done to lessen the likelihood of recurrence and/or speed the healing of these areas. She is already had previous ablations. She no doubt has severe venous hypertension 8/5-Patient returns at 1 week, she was in [place] for 3 days by her podiatrist, we have been using so backed to the wound, she has increased pain in both the wounds on the left lower leg especially the more distal one on the lateral aspect 8/12-Patient returns at 1 week and she is agreeable to having debridement in both wounds on her left leg today. We have been using Sorbact, and vascular studies were reviewed at last visit 8/19; the patient arrives with her wounds fairly clean and no debridement is required. We have used Sorbact which is really done a nice job in cleaning up these very difficult wound surfaces. The patient saw Dr. Donzetta Matters of vascular surgery on 7/31. He did not feel that there was an arterial component. He felt that her treated greater saphenous vein is adequately addressed and that the small saphenous vein did not appear to be involved significantly. She was also noted to have deep venous reflux which is not treatable. Dr. Donzetta Matters mentioned the possibility of a central obstructive component leading to reflux and he offered her central venography. She wanted to discuss this or think about it. I have urged her to go ahead with this. She has had recurrent difficult wounds in these areas which  do heal but after months in the clinic. If there is anything that can be done to reduce the likelihood of this I think it is worth it. 9/2 she is still working towards getting follow-up with Dr. Donzetta Matters to schedule her CT. Things are quite a bit worse venography. I put Apligraf on 2 weeks ago on both wounds on the medial and lateral part of her left lower leg. She arrives in clinic today with 3 superficial additional wounds above the area laterally and one below the wound medially. She describes a lot of discomfort. I think these are probably wrapped injuries. Does not look like she has cellulitis. 07/20/2019 on evaluation today patient appears to be doing somewhat poorly in regard to her lower extremity ulcers. She in fact  showed signs of erythema in fact we may even be dealing with an infection at this time. Unfortunately I am unsure if this is just infection or if indeed there may be some allergic reaction that occurred as a result of the Apligraf application. With that being said that would be unusual but nonetheless not impossible in this patient is one who is unfortunately allergic to quite a bit. Currently we have been using the Sorbact which seems to do as well as anything for her. I do think we may want to obtain a culture today to see if there is anything showing up there that may need to be addressed. 9/16; noted that last week the wounds look worse in 1 week follow-up of the Apligraf. Using Sorbact as of 2 days ago. She arrives with copious amounts of drainage and new skin breakdown on the back of the left calf. The wounds arm more substantial bilaterally. There is a fair amount of swelling in the left calf no overt DVT there is edema present I think in the left greater than right thigh. She is supposed to go on 9/28 for CT venography. The wounds on the medial and lateral calf are worse and she has new skin breakdown posteriorly at least new for me. This is almost developing into a  circumferential wound area The Apligraf was taken off last week which I agree with things are not going in the right direction a culture was done we do not have that back yet. She is on Augmentin that she started 2 days ago 9/23; dressing was changed by her nurses on Monday. In general there is no improvement in the wound areas although the area looks less angry than last week. She did get Augmentin for MSSA cultured on the 14th. She still appears to have too much swelling in the left leg even with 3 layer compression 9/30; the patient underwent her procedure on 9/28 by Dr. Donzetta Matters at vascular and vein specialist. She was discovered to have the common iliac vein measuring 12.2 mm but at the level of L4-L5 measured 3 mm. After stenting it measured 10 mm. 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 Rouleaux flow in the common femoral and femoral vein was observed much improved after stenting. We are using silver alginate to the wounds on the medial and lateral ankle on the left. 4 layer compression 10/7; the patient had fluid swelling around her knee and 4 layer compression. At the advice of vein and vascular this was reduced to 3 layer which she is tolerating better. We have been using silver alginate under 3 layer compression since last Friday LOETTA, CONNELLEY ([id number]) 10/14; arrives with the areas on the left ankle looking a lot better. Inflammation in the area also a lot better. She came in for a nurse check on 10/9 10/21; continued nice improvement. Slight improvements in surface area of both the medial and lateral wounds on the left. A lot of the satellite lesions in the weeping erythema around these from stasis dermatitis is resolved. We have been using silver alginate 10/28; general improvement in the entire wound areas although not a lot of change in dimensions the wound certainly looks better. There is a lot less in terms of venous inflammation. Continue silver alginate this  week however look towards Hydrofera Blue next week 11/4; very adherent debris on the medial wound left wound is not as bad. We have been using silver alginate. Change to [facility] today  11/11; very adherent debris on both wound areas. She went to vein and vascular last week and follow-up they put in [place] boot on this today. He says the [facility] was adherent. Wound is definitely not as good as last week. Especially on the left there the satellite lesions look more prominent 11/18; absolutely no better. erythema on lateral aspect with tenderness. 09/30/2019 on evaluation today patient appears to actually be doing better. Dr. Dellia Nims did put her on doxycycline last week which I do believe has helped her at this point. Fortunately there is no signs of active infection at this time. No fevers, chills, nausea, vomiting, or diarrhea. I do believe he may want extend the doxycycline for 7 additional days just to ensure everything does completely cleared up the patient is in agreement with that plan. Otherwise she is going require some sharp debridement today 12/2; patient is completing a 2-week course of doxycycline. I gave her this empirically for inflammation as well as infection when I last saw her 2 weeks ago. All of this seems to be better. She is using silver alginate she has the area on the medial aspect of the larger area laterally and the 2 small satellite regions laterally above the major wound. 12/9; the patient's wound on the left medial and left lateral calf look really quite good. We have been using silver alginate. She saw vein and vascular in follow-up on 10/09/2019. She has had a previous left greater saphenous vein ablation by Dr. Oscar La in 2016. More recently she underwent a left common iliac vein stent by Dr. 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 The swelling is improved and certainly the wounds have improved. The patient shows [place] today area on the right medial calf  there is almost no wound but leaking lymphedema. She says she start this started 3 or 4 days ago. She did not traumatize it. It is not painful. She does not wear compression on that side 12/16; the patient continues to do well laterally. Medially still requiring debridement. The area on the right calf did not materialize to anything and is not currently open. We wrapped this last time. She has support stockings for that leg although I am not sure they are going to provide adequate compression 12/23; the lateral wound looks stable. Medially still requiring debridement for tightly adherent fibrinous debris. We've been using silver alginate. Surface area not any different 12/30; neither wound is any better with regards to surface and the area on the left lateral is larger. I been using silver alginate to the left lateral which look quite good last week and Sorbact to the left medial 11/11/2019. Lateral wound area actually looks better and somewhat smaller. Medial still requires a very aggressive debridement today. We have been using Sorbact on both wound areas 1/13; not much better still adherent debris bilaterally. I been using Sorbact. She has severe venous hypertension. Probably some degree of dermal fibrosis distally. I wonder whether tighter compression might help and I am going to try that today. We also need to work on the bioburden 1/20; using Sorbact. She has severe venous hypertension status post stent placement for pelvic vein compression. We applied gentamicin last time to see if we could reduce bioburden I had some discussion with her today about the use of pentoxifylline. This is occasionally used in this setting for wounds with refractory venous insufficiency. However this interacts with Plavix. She tells me that she was put on this after stent placement for  3 months. She will call Dr. Claretha Cooper office to discuss 1/27; we are using gentamicin under Sorbact. 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 She has a stent. Wound medially is measuring smaller this week. Laterally measuring slightly larger although she has some satellite lesions superiorly 2/3; gentamicin under Sorbact under 4-layer compression. 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 She has a stent on Plavix. Her wounds are measuring smaller this week. More substantially laterally where there is a satellite lesion superiorly. 2/10; gentamicin under Sorbac. 4-layer compression. Patient communicated with Dr. Donzetta Matters at vein and vascular in [place]. He is okay with the patient coming off Plavix I will therefore start her on pentoxifylline for a 1 month trial. In general her wounds look better today. I had some concerns about swelling in the left thigh however she measures 61.5 on the right and 63 on the mid thigh which does not suggest there is any difficulty. The patient is not describing any pain. 2/17; gentamicin under Sorbac 4-layer compression. She has been on pentoxifylline for 1 week and complains of loose stool. No nausea she is eating and drinking well 2/24; the patient apparently came in 2 days ago for a nurse visit when her wrap fell down. Both areas look a little worse this week macerated medially and satellite lesions laterally. Change to silver alginate today 3/3; wounds are larger today especially medially. She also has more swelling in her foot lower leg and I even noted some swelling in her posterior thigh which is tender. I wonder about the patency of her stent. Fortuitously she sees Dr. Claretha Cooper group on Friday 3/10; Mrs. Timoney was seen by vein and vascular on 3/5. The patient underwent ultrasound. There was no evidence of thrombosis involving the IVC no evidence of thrombosis involving the right common iliac vein there is no evidence of thrombosis involving the right external iliac vein the left external vein is also patent. The right common  iliac vein stent appears patent bilateral common femoral veins are compressible and appear patent. I was concerned about the left common iliac stent however it looks like this is functional. She has some edema in the posterior thigh that was tender she still has that this week. I also note they had trouble finding the pulses in her left foot and booked her for an ABI baseline in 4 weeks. She will follow up in 6 months for repeat IVC duplex. The patient stopped the pentoxifylline because of diarrhea. It does not look like that was being effective in any case. I have advised her to go back on her aspirin 81 mg tablet, vascular it also suggested this 3/17; comes in today with her wound surfaces a lot better. The excoriations from last week considerably better probably secondary to the TCA. We have been using silver alginate 3/24; comes in today with smaller wounds both medially and laterally. Both required debridement. There are 2 small satellite areas superiorly laterally. She also has a very odd bandlike area in the mid calf almost looking like there was a weakness in the wrap in a localized area. I would write this off as being this however anteriorly she has a small raised ballotable area that is very tender almost reminiscent of an abscess but there was no obvious purulent surface to it. DOXIE, AUGENSTEIN ([id number]) 02/04/20 upon evaluation today patient appears to be doing fairly well in regard to her wounds today. Fortunately there is no signs of active infection at this  time. No fevers, chills, nausea, vomiting, or diarrhea. She has been tolerating the dressing changes without complication. Fortunately I feel like she is showing signs of improvement although has been sometime since have seen her. Nonetheless the area of concern that Dr. Dellia Nims had last week where she had possibly an area of the wrap that was we can allow the leg to bulge appears to be doing significantly better today there is  no signs of anything worsening. 4/7; the patient's wounds on her medial and lateral left leg continue to contract. We have been using a regular alginate. Last week she developed an area on the right medial lower leg which is probably a venous ulcer as well. 4/14; the wounds on her left medial and lateral lower leg continue to contract. Surface eschar. We have been using regular alginate. The area on the right medial lower leg is closed. We have been putting both legs under 4-layer contraction. The patient went back to see vein and vascular she had arterial studies done which were apparently "quite good" per the patient although I have not read their notes I have never felt she had an arterial issue. 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 This is been longstanding and refractory to exercise, leg elevation and longstanding use of compression wraps in our clinic as well as compression stockings on the times we have been able to get these to heal 4/21; we thought she actually might be close this week however she arrives in clinic with a lot of edema in her upper left calf and into her posterior thigh. This is been an intermittent problem here. She says the wrap fell down but it was replaced with a nurse visit on Monday. We are using calcium alginate to the wounds and the wound sizes there not terribly larger than last week but there is a lot more edema 4/28; again wound edges are smaller on both sides. Her edema is better controlled than last time. She is obtained her compression pumps from [employer] although they have not been to her home to set these up. 5/5; left medial and left lateral both look stable. I am not sure the medial is any smaller. We have been using calcium alginate under 4-layer compression. She had an area on the right medial. This was eschared today. We have been wrapping this as well. She does not tolerate external  compression stockings due to a history of various contact allergies. She has her compression pumps however the representative from the company is coming on her to show her how to use these tomorrow 5/19; patient with severe chronic venous insufficiency secondary to central venous disease. She had a stent placed in her left common iliac vein. She has done better since but still difficult to control wounds. She comes in today with nothing open on the right leg. Her areas on the left medial and left lateral are just about closed. We are using calcium alginate under 4-layer compression. She is using her external compression pumps at home She only has 15-20 support stockings. States she cannot get anything tighter than that on. 03/30/20-Patient returns at 1 week, the wounds on the left leg are both slightly bigger, the last week she was on 3 layer compression which started to slide down. She is starting to use her lymphedema pumps although she stated on 1 day her right ankle started to swell up and she have to stop that day. Unfortunately the open area seem  to oscillate between improving to the point of healing and then flaring up all to do with effectiveness of compression or lack of due to the left leg topography not keeping the compression wraps from rolling down 6/2 patient comes in with a 15/20 mmHg stocking on the right leg. She tells me that she developed a lot of swelling in her ankles she saw orthopedics she was felt to possibly be having a flare of pseudogout versus some other type of arthritis. She was put on steroids for a respiratory issue so that helps with the inflammation. She has not been using the pumps all week. She thinks the left thigh is more swollen than usual and I would agree with that. She has an appointment with Dr. Donzetta Matters 9 days or so from now 6/9; both wounds on the left medial and left lateral are smaller. We have been using calcium alginate under compression. She does not have  an open wound on the right leg she is using a stocking and her compression pumps things are going well. She has an appointment with Dr. Donzetta Matters with regards to her stent in the left common iliac vein 6/16; the wounds on the left medial and left lateral ankle continues to contract. The patient saw Dr. Donzetta Matters and I think he seems satisfied. Ordered follow-up venous reflux studies on both sides in September. Cautioned that she may need thigh-high stockings. She has been using calcium alginate under compression on the left and her own stocking on the right leg. She tells [place] there are no open wounds on the right 6/23; left lateral is just about closed. Medial required debridement today. We have been using calcium alginate. Extensive discussion about the compression pumps she is only using these on 25 mmHg states she could not take 40 or 30 when the wrap came out to her home to demonstrate these. He said they should not feel tight 6/30; the left lateral wound has a slight amount of eschar. . The area medially is about the same using Hydrofera Blue. 7/7; left lateral wound still has some eschar. I will remove this next week may be closed. The area medially is very small using Hydrofera Blue with improvement. Unfortunately the stockings fell down. Unfortunately the blisters have developed at the edge of where the wrap fell. When this happened she says her legs hurt she did not use her pumps. We are not open Monday for her to come in and change the wraps and she had an appointment yesterday. She also tells me that she is going to have an MRI of her back. She is having pain radiating into her left anterior leg she thinks her from an L5 disc. She saw Dr. Ellene Route of neurosurgery 7/14; the area on the left lateral ankle area is closed. Still a small area medially however it looks better as well. We have been using Hydrofera Blue under 4-layer compression 7/21; left lateral ankle is still closed however her wound on  the medial left calf is actually larger. This is probably because Hydrofera Blue got stuck to the wound. She came in for a nurse change on Friday and will do that again this week I was concerned about the amount of swelling that she had last week however she is using her compression pumps twice a day and the swelling seems well controlled 7/28; remaining wound on the left medial lower leg is smaller. We have been using moistened silver collagen under compression she is coming back for a  nurse visit. For reasons that were not really clear she was just keeping her legs elevated and not using her compression pumps. I have asked her to use the compression pumps. She does not have any wounds on the right leg 06/15/20-Patient returns at 2 weeks, her LLE edema is worse and she developed a blister wound that is new and has bigger posterior calf wound on right, we are using Prisma with pad, 4 layer compression. she has been on lasix 40 mg daily 8/18; patient arrives today with things a lot worse than I remember from a few weeks ago. She was seen last week. Noted that her edema was worse and that she now had a left lateral wound as well as deteriorating edema in the medial and posterior part of the lower leg. She says she is using his or her external compression pumps once a day although I wonder about the compliance. 8/25; weeping area on the right medial lower leg. This had actually gotten a small localized area of her compression stocking wet. On the left side there is a large denuded area on the posterior medial lower leg and smaller area on the lateral. This was not the original areas that we dealt with. 9/1 the patient's wound on the left leg include the left lateral and left posterior. Larger superficial wounds weeping. She has very poor edema control. Tender localized edema in the left lower medial ankle/heel probably because of localized wrap issues. She freely admits she is not using the compression  pumps. She has been up on her feet a lot. She thinks the hydrofera blue is contributing to the pain she is experiencing.. This is a complaint that I have occasionally heard [name], [name] ([id number]) 9/8; really not much improvement. The patient is still complaining of a lot of pain particularly when she uses compression pumps. I switched her to silver alginate last time because she found the Hydrofera Blue to be irritating. I don't hear much difference in her description with the silver alginate. 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 She has follow-up with Dr. Donzetta Matters I think for ultrasound next month 9/15; quite a bit of improvement today. We have less edema and more epithelialization in both of her wound areas on the left medial and left lateral calf. These are not the site of her original wounds in this area. She says she has been using her compression pumps for 30 minutes twice a day, there pain issues that never quite understood. Silver alginate as the primary dressing 9/22; continued improvement. Both areas medially and laterally still have a small open area there is some eschar. She continues to complain of left medial ankle pain. Swelling in the leg is in much better condition. We have been using silver alginate 9/29; continued improvement. Both areas medially and laterally in the left calf look as though they are close some minor surface eschar but I think this is epithelialized. She comes in today saying she has a ruptured disc at L4-L5 cannot bend over to put on her stockings. 10/6; patient comes in today with no open wounds on either leg. However her edema on the left leg in the upper one third of the lower leg is poorly controlled nonpitting. She says that she could not use the pumps for 2 days and then she has been using the last couple of days. It is not clear to  me she has been able to get her stocking  on. She has back problems. Mrs. Coburn has severe chronic venous insufficiency with secondary lymphedema. Her venous insufficiency is partially centrally mediated and that she is now post stent in the left common iliac vein. [facility] Thurner's syndrome/physiology]. She follows up with them on 10/15. She wears 20/30 below-knee stockings. She is supposed to use compression pumps at home although I think her compliance about with this is been less than 100%. I have asked her to use these 3 times a day. Finally I think she has lipodermatosclerosis in the left lower leg with an inverted bottle sign. It is been a major problem controlling the edema in the left leg. The right leg we have had wounds on but not as significant a problem is on the left READMISSION 04/12/2021 Mrs. Seif is a 76 year old woman we know well in this clinic. She has severe chronic venous insufficiency. 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 I believe she has had bilateral greater saphenous vein ablation in the past as well. She tells me that this wound opened sometime in March. She had a fall and thinks it was initially abrasion. She developed areas she describes as little blisters on the anterior part of her leg and she saw dermatology and was treated for methicillin staph aureus with several rounds of antibiotics. She has been using support stockings on the left leg and says this is the only thing she can get on. Her compression pump use maybe once a day she says if she did not use one she use the other. She comes in today with incredible swelling in the left leg with a wound on the left posterior calf. She has been using Neosporin to this previously a hydrocolloid. 6/15; patient arrives back for 1 week follow-up.Marland Kitchen Apparently her wrap fell down she did not call us to replace this. He has poor edema control. She only uses her compression pumps once a day 6/29; patient presents for 1 week  follow-up. She has tolerated the compression wrap well. We have been using Iodoflex under the wrap. She has no issues or complaints today. 7/6; patient presents for 1 week follow-up. She states that the compression wrap rolled down her leg 4 days ago. She has been trying to keep the area covered but has no dressings at home to use. She denies signs of infection. 7/13; patient presents for 1 week follow-up. She states that her compression wrap rolled down her right leg and she called our office and had it placed a few days ago. She has been tolerating the current wrap well. She states that the Iodoflex is causing a burning sensation. She denies signs of infection. 7/27; patient presents for 1 week follow-up She has tolerated the compression wrap well with Hydrofera Blue underneath. She has now developed a wound to her right lower extremity. She reports having a culture done To this area by her dermatologist that she reports is negative. She currently denies signs of infection. 8/30; patient presents for 1 week follow-up. On the left side she has tolerated the compression wrap well with Hydrofera Blue underneath. She reports some discomfort to her right lower extremity with the 3 layer compression. She currently denies signs of infection. 06/14/2021 upon evaluation today patient's wound actually appears to be doing decently well based on what I am seeing currently. She actually has 2 areas 1 on the left distal/posterior lower leg and  the other on the right lower leg. Subsequently the measurements are roughly the same may be slightly smaller but in general have not made any trend towards getting worse which is great news. 06/23/2021 upon evaluation today patient appears to be doing pretty well in regard to her wounds. On the right she is having difficulty with sciatic pain and this subsequently has led to her taking the wrap off over the last week her leg is more swollen she is also been on prednisone for  10 days. With that being said I think we may want to just use a compression sock on the left that way she will not have to worry about the compression wrap being in place that she cannot get off. With that being said I do believe as well that the patient is going to need to continue with the wrapping on the left we will also need to do a little bit of sharp debridement today. 8/24; patient presents for 1 week follow-up. She has no issues or complaints today. She denies signs of infection. 8/31; patient presents for 1 week follow-up. She has no issues or complaints today. She has tolerated the compression wrap well on the left lower extremity. She denies signs of infection. 9/7; patient presents for follow-up. She reports that the compression wrap rolled down slightly on her right lower extremity. She reports tenderness to the wound bed. She denies increased warmth or erythema to the surrounding skin. She overall feels well. 9/14; patient presents for follow-up. She has no issues or complaints today. She denies signs of infection. PuraPly is available and patient would like to start this today. 9/21; patient presents for follow-up. She has no issues or complaints today. She tolerated the first skin substitute placement last week under compression. 9/28; patient presents for 1 week follow-up. She has no issues or complaints today. 10/5; patient presents for 1 week follow-up. She reports taking the wrap off yesterday. She was on her feet more yesterday and developed more swelling to her left lower extremity. She denies pain. Overall she is doing well. HARVEY, LINGO ([id number]) 10/12; patient presents for 1 week follow-up. She has no issues or complaints today. 10/19; patient presents for follow-up. She has no issues or complaints today. She tolerated the compression wrap well. She states she has compression stockings at home. Readmission: 11/27/2021 upon evaluation today patient appears to be  doing poorly in regard to the wounds on her right leg. She also has an area of erythema in the proximal calf area of the right leg which again I do believe is a issue from the standpoint of it being warm and erythematous to touch and also seems to be somewhat localized I really think this may be more of a cellulitis issue than anything else. Fortunately I do not see any signs of active infection Systemically at this time. 12/05/2021 upon evaluation patient's wounds actually appear to be showing some signs of improvement which is not too bad and just 1 week's time. Especially since we had to get on the right antibiotic which is only been for the past 4 days. Fortunately I do not see any signs of active infection locally nor systemically at this time that has gotten any worse. Overall I think that again with the antibiotic she is significantly improved and currently she is taking Levaquin. 12/11/2021 upon evaluation today patient appears to be doing well with regard to her leg. Between the antibiotics and the triamcinolone I feel like she is  doing significantly better which is great news. Overall I am extremely pleased with where we stand. There does not appear to be any signs of active infection locally or systemically at this time. 12/18/2021 upon evaluation today patient appears to be doing well with regard to her wound. I do feel like this is showing signs of some improvement here. Little by little this is clearing up quite nicely. I think it is just a slow process but nonetheless 1 that we are seeing evidence of improvement with regard to. 12/25/2021 upon evaluation today patient appears to be doing well with regard to her wounds she is definitely making some good progress here. Fortunately I do not see any signs of active infection locally or systemically at this time which is great news. No fevers, chills, nausea, vomiting, or diarrhea. 01/02/2022 upon evaluation today patient appears to be doing  well with regard to her wound. She has been tolerating the dressing changes without complication. Fortunately I do not see any evidence of active infection locally or systemically which is great news and overall I am extremely pleased with where we stand today. 01/08/2022 upon evaluation today patient appears to be doing better in regard to her ankle ulcers. She has been tolerating the dressing changes without complication. Fortunately I do not see any signs of infection and both the ankle and leg area is making great progress. 3/13; patient presents for follow-up. She has no issues or complaints today. She was tolerated the compression wrap well. 01/19/2022 upon evaluation today patient appears to be doing well currently in regard to her right leg which overall I think is doing excellent. She had called in to try to get her in sooner for a dressing change due to an issue where her cat scratched her on the left leg. With that being said it does not appear that this was actually a scratch but rather that it was a puncture over a small varicose vein. She had a tremendous amount of bleeding as evidenced by the amount of blood in her shoes currently. The good news is this is stopped currently the bad news is that she needs to be very careful with regard to her cats later as this could definitely be a significant problem for her. Fortunately I do not see any signs of active infection locally nor systemically at this point. 02-08-2022 on evaluation today patient actually appears to be doing significantly better. She was in the hospital due to having stepped on attack on her left heel but nonetheless this is actually showing signs of improvement at this point. I do not see any evidence of active infection locally or systemically which is great news and overall I am extremely pleased with where we stand today. Her right leg is showing no signs of infection. She has been on antibiotics while in the hospital and at  discharge. 02-15-2022 upon evaluation today patient appears to be doing better in regard to her wounds. Overall I feel like we are headed in the right direction. With that being said she does have an area of irritation posteriorly that was not there last week again that makes this wound posterior look little bigger but overall things are significantly better. I am very pleased in that regard. 02-23-2022 upon evaluation today patient appears to be doing well with regard to her wound. It is doing a little bit worse only and the fact that it is dry other than that things seem to be making good improvements here. There  does not appear to be any signs of active infection at this time which is great news. 4/27; right lateral leg 4-5 small open areas with eschar on top. I used a curette to remove this. She has been using Xeroform under 3 layer compression. 03-08-2022 upon evaluation today patient appears to be doing well with regard to the wounds on her lower extremity. She has been tolerating the dressing changes without complication. Fortunately there does not appear to be any signs of active infection at this time which is great news. No fevers, chills, nausea, vomiting, or diarrhea. 03-15-2022 upon evaluation today patient appears to be doing well currently in regard to her wounds. She has been tolerating the dressing changes without complication. Fortunately there does not appear to be any evidence of active infection locally nor systemically at this time which is great news. I do believe she is getting very close to resolution. 03-22-2022 upon evaluation today patient actually appears to be doing awesome in fact she is has 1 very tiny area immediately left open the rest of this appears to be almost completely closed. I do not see any evidence of infection locally or systemically which is great news and overall I am extremely pleased with where things stand at this point. I do think we are getting very  close to complete resolution. 03-29-2022 upon evaluation today patient appears to be doing well currently in regard to her wound. In fact this appears to be almost completely healed. I think that if she still continues to have no significant drainage come next week that we will probably get a go ahead and heal her out at that point. GENESYS, COGGESHALL ([id number]) Objective Constitutional [name] and well-hydrated in no acute distress. Vitals Time Taken: 1:40 PM, Height: 62 in, Weight: 199 lbs, BMI: 36.4, Temperature: 98 F, Pulse: 77 bpm, Respiratory Rate: 18 breaths/min, Blood Pressure: 153/78 mmHg. Respiratory normal breathing without difficulty. Psychiatric this patient is able to make decisions and demonstrates good insight into disease process. Alert and Oriented x 3. pleasant and cooperative. General Notes: Excellent granulation epithelization in fact I am not even sure there is much open there may be a pinpoint area on the medial aspect everything else is completely closed and looks awesome. Integumentary (Hair, Skin) Wound #16 status is Open. Original cause of wound was Gradually Appeared. The date acquired was: 10/26/2021. The wound has been in treatment 13 weeks. The wound is located on the Right,Medial Lower Leg. The wound measures 0.1cm length x 0.1cm width x 0.1cm depth; 0.008cm^2 area and 0.001cm^3 volume. There is no tunneling or undermining noted. There is a none present amount of drainage noted. There is no granulation within the wound bed. There is no necrotic tissue within the wound bed. Assessment Active Problems ICD-10 Lymphedema, not elsewhere classified Chronic venous hypertension (idiopathic) with ulcer and inflammation of right lower extremity Non-pressure chronic ulcer of other part of right lower leg limited to breakdown of skin Procedures There was a Three Layer Compression Therapy Procedure by Levora Dredge, RN. Post procedure Diagnosis Wound #: Same  as Pre-Procedure Plan Follow-up Appointments: Return Appointment in 1 week. Nurse Visit as needed Bathing/ Shower/ Hygiene: May shower with wound dressing protected with water repellent cover or cast protector. No tub bath. Anesthetic (Use 'Patient Medications' Section for Anesthetic Order Entry): Lidocaine applied to wound bed Edema Control - Lymphedema / Segmental Compressive Device / Other: Optional: One layer of unna paste to top of compression wrap (to act as an anchor). -  she needs this Elevate, Exercise Daily and Avoid Standing for Long Periods of Time. Elevate leg(s) parallel to the floor when sitting. DO YOUR BEST to sleep in the bed at night. DO NOT sleep in your recliner. Long hours of sitting in a recliner leads to swelling of the legs and/or potential wounds on your backside. LACYE, MCCARN ([id number]) Additional Orders / Instructions: Follow Nutritious Diet and Increase Protein Intake WOUND #16: - Lower Leg Wound Laterality: Right, Medial Cleanser: Soap and Water 1 x Per Week/15 Days Discharge Instructions: Gently cleanse wound with antibacterial soap, rinse and pat dry prior to dressing wounds Cleanser: Wound Cleanser 1 x Per Week/15 Days Discharge Instructions: Wash your hands with soap and water. Remove old dressing, discard into plastic bag and place into trash. Cleanse the wound with Wound Cleanser prior to applying a clean dressing using gauze sponges, not tissues or cotton balls. Do not scrub or use excessive force. Pat dry using gauze sponges, not tissue or cotton balls. Primary Dressing: Xeroform-HBD 2x2 (in/in) 1 x Per Week/15 Days Discharge Instructions: Apply Xeroform-HBD 2x2 (in/in) as directed Secondary Dressing: Gauze 1 x Per Week/15 Days Compression Wrap: 3-LAYER WRAP - Profore Lite LF 3 Multilayer Compression Bandaging System 1 x Per Week/15 Days Discharge Instructions: Apply 3 multi-layer wrap as prescribed. 1. I would recommend that we going continue  with the wound care measures as before and the patient is in agreement with plan. This includes the use of the Xeroform gauze dressing which I think is doing quite well. 2. We will continue with the 3 layer compression wrap which I feel like it is also showing signs of excellent improvement this is great news. We will see patient back for reevaluation in 1 week here in the clinic. If anything worsens or changes patient will contact our office for additional recommendations. Electronic Signature(s) Signed: 03/29/2022 5:20:35 PM By: Worthy Keeler [name]-C Entered By: Worthy Keeler on 03/29/2022 17:20:34 Huster, [name] ([id number]) -------------------------------------------------------------------------------- SuperBill Details Patient Name: MAHOGANIE, BASHER. Date of Service: 03/29/2022 Medical Record Number: [id number] Patient Account Number: [id number] Date of Birth/Sex: 08-19-1946 (76 y.o. F) Treating RN: Levora Dredge Primary Care Provider: [name] Other Clinician: Referring Provider: [name] Treating Provider/Extender: [name] in Treatment: 17 Diagnosis Coding ICD-10 Codes Code Description I89.0 Lymphedema, not elsewhere classified I87.331 Chronic venous hypertension (idiopathic) with ulcer and inflammation of right lower extremity L97.811 Non-pressure chronic ulcer of other part of right lower leg limited to breakdown of skin Facility Procedures CPT4 Code: [id number] Description: (Facility Use Only) [phone] - [facility] RT LEG Modifier: Quantity: 1 Physician Procedures CPT4 Code Description: [id number] - WC PHYS LEVEL 3 - EST PT Modifier: Quantity: 1 CPT4 Code Description: ICD-10 Diagnosis Description I89.0 Lymphedema, not elsewhere classified I87.331 Chronic venous hypertension (idiopathic) with ulcer and inflammation of rig L97.811 Non-pressure chronic ulcer of other part of right lower leg limited  to brea Modifier: ht lower  extremit kdown of skin Quantity: y Engineer, maintenance) Signed: 03/29/2022 5:21:03 PM By: Worthy Keeler [name]-C Previous Signature: 03/29/2022 4:20:08 PM Version By: Levora Dredge Entered By: Worthy Keeler on 03/29/2022 17:21:03

## 2022-03-29 NOTE — Progress Notes (Signed)
This patient returns to my office for at risk foot care.  This patient requires this care by a professional since this patient will be at risk due to having chronic venous insufficiency.  Patient is wearing a bandage and compression right leg.  She said she developed an infection to her foot and leg requiring IV hospital antibiotics..  Wearing compression sock right leg..  Patient is taking trental.This patient is unable to cut nails herself  since the patient cannot reach her  nails.These nails are painful walking and wearing shoes.  This patient presents for at risk foot care today.  General Appearance  Alert, conversant and in no acute stress.  Vascular  Dorsalis pedis and posterior tibial pulses are palpable.    Neurologic  Senn-Weinstein  WNL   diminished   Nails Thick disfigured discolored nails with subungual debris  from hallux to fifth toes bilaterally. No evidence of bacterial infection or drainage bilaterally. Pincer hallux nails  B/L.   Orthopedic  No limitations of motion  feet .  No crepitus or effusions noted.  No bony pathology or digital deformities noted. DJD [name]  B/L.  Skin  normotropic skin with no porokeratosis noted bilaterally.  No signs of infections or ulcers noted.   Unknown swelling dorsum of right foot.  Onychomycosis  Pain in right toes  Pain in left toes  Consent was obtained for treatment procedures.   Mechanical debridement of nails 1-5  bilaterally performed with a nail nipper.  Filed with dremel without incident. No infection or ulcer.     Return office visit   12   weeks        Told patient to return for periodic foot care and evaluation due to potential at risk complications.   Gardiner Barefoot DPM

## 2022-04-05 ENCOUNTER — Encounter: Payer: Medicare Other | Attending: Physician Assistant | Admitting: Physician Assistant

## 2022-04-05 DIAGNOSIS — I87331 Chronic venous hypertension (idiopathic) with ulcer and inflammation of right lower extremity: Secondary | ICD-10-CM | POA: Insufficient documentation

## 2022-04-05 DIAGNOSIS — J45909 Unspecified asthma, uncomplicated: Secondary | ICD-10-CM | POA: Insufficient documentation

## 2022-04-05 DIAGNOSIS — I872 Venous insufficiency (chronic) (peripheral): Secondary | ICD-10-CM | POA: Insufficient documentation

## 2022-04-05 DIAGNOSIS — I890 Lymphedema, not elsewhere classified: Secondary | ICD-10-CM | POA: Diagnosis not present

## 2022-04-05 DIAGNOSIS — I10 Essential (primary) hypertension: Secondary | ICD-10-CM | POA: Insufficient documentation

## 2022-04-05 DIAGNOSIS — L97811 Non-pressure chronic ulcer of other part of right lower leg limited to breakdown of skin: Secondary | ICD-10-CM | POA: Diagnosis not present

## 2022-04-05 DIAGNOSIS — G4733 Obstructive sleep apnea (adult) (pediatric): Secondary | ICD-10-CM | POA: Insufficient documentation

## 2022-04-05 NOTE — Progress Notes (Signed)
[name], [name] ([id number]) Visit Report for 04/05/2022 Arrival Information Details Patient Name: [name], [name]. Date of Service: 04/05/2022 2:30 PM Medical Record Number: [id number] Patient Account Number: [id number] Date of Birth/Sex: Sep 24, 1946 (76 y.o. F) Treating RN: Levora Dredge Primary Care Gelene Recktenwald: [name] Other Clinician: Referring Tyray Proch: [name] Treating Allante Whitmire/Extender: [name] in Treatment: 66 Visit Information History Since Last Visit Added or deleted any medications: No Patient Arrived: Ambulatory Any new allergies or adverse reactions: No Arrival Time: 14:38 Had a fall or experienced change in No Accompanied By: self activities of daily living that may affect Transfer Assistance: None risk of falls: Patient Identification Verified: Yes Hospitalized since last visit: No Secondary Verification Process Completed: Yes Has Dressing in Place as Prescribed: No Patient Requires Transmission-Based No Has Compression in Place as Prescribed: No Precautions: Pain Present Now: Yes Patient Has Alerts: Yes Patient Alerts: Patient on Blood Thinner 36m aspirin [place] vascular ABI Electronic Signature(s) Signed: 04/05/2022 3:50:43 PM By: GLevora DredgeEntered By: GLevora Dredgeon 04/05/2022 14:38:20 Mccaffery, LTenna Child([id number] -------------------------------------------------------------------------------- Clinic Level of Care Assessment Details Patient Name: [name], [name] Date of Service: 04/05/2022 2:30 PM Medical Record Number: [id number] Account Number: [id number] of Birth/Sex: [birth year](76y.o. F) Treating RN: GLevora DredgePrimary Care Seini Lannom: GRia BushOther Clinician: Referring Tibor Lemmons: GRia BushTreating Maryruth Apple/Extender: SSkipper Clichein Treatment: 18 Clinic Level of Care Assessment Items TOOL 1 Quantity Score '[]'  - Use when EandM and Procedure is performed on INITIAL visit  0 ASSESSMENTS - Nursing Assessment / Reassessment '[]'  - General Physical Exam (combine w/ comprehensive assessment (listed just below) when performed on new 0 pt. evals) '[]'  - 0 Comprehensive Assessment (HX, ROS, Risk Assessments, Wounds Hx, etc.) ASSESSMENTS - Wound and Skin Assessment / Reassessment '[]'  - Dermatologic / Skin Assessment (not related to wound area) 0 ASSESSMENTS - Ostomy and/or Continence Assessment and Care '[]'  - Incontinence Assessment and Management 0 '[]'  - 0 Ostomy Care Assessment and Management (repouching, etc.) PROCESS - Coordination of Care '[]'  - Simple Patient / Family Education for ongoing care 0 '[]'  - 0 Complex (extensive) Patient / Family Education for ongoing care '[]'  - 0 Staff obtains CProgrammer, systems Records, Test Results / Process Orders '[]'  - 0 Staff telephones HHA, Nursing Homes / Clarify orders / etc '[]'  - 0 Routine Transfer to another Facility (non-emergent condition) '[]'  - 0 Routine Hospital Admission (non-emergent condition) '[]'  - 0 New Admissions / IBiomedical engineer/ Ordering NPWT, Apligraf, etc. '[]'  - 0 Emergency Hospital Admission (emergent condition) PROCESS - Special Needs '[]'  - Pediatric / Minor Patient Management 0 '[]'  - 0 Isolation Patient Management '[]'  - 0 Hearing / Language / Visual special needs '[]'  - 0 Assessment of Community assistance (transportation, D/C planning, etc.) '[]'  - 0 Additional assistance / Altered mentation '[]'  - 0 Support Surface(s) Assessment (bed, cushion, seat, etc.) INTERVENTIONS - Miscellaneous '[]'  - External ear exam 0 '[]'  - 0 Patient Transfer (multiple staff / HCivil Service fast streamer/ Similar devices) '[]'  - 0 Simple Staple / Suture removal (25 or less) '[]'  - 0 Complex Staple / Suture removal (26 or more) '[]'  - 0 Hypo/Hyperglycemic Management (do not check if billed separately) '[]'  - 0 Ankle / Brachial Index (ABI) - do not check if billed separately Has the patient been seen at the hospital within the last three years:  Yes Total Score: 0 Level Of Care: ____ PJannifer Franklin([id number] Electronic Signature(s) Signed: 04/05/2022 3:50:43 PM By: GLevora DredgeEntered By: GAraceli Bouche  Caitlin on 04/05/2022 15:29:39 TOVA, VATER ([id number]) -------------------------------------------------------------------------------- Compression Therapy Details Patient Name: [name], [name]. Date of Service: 04/05/2022 2:30 PM Medical Record Number: [id number] Patient Account Number: [id number] Date of Birth/Sex: [birth year] (76 y.o. F) Treating RN: Levora Dredge Primary Care Slayden Mennenga: [name] Other Clinician: Referring Modesta Sammons: [name] Treating Darean Rote/Extender: [name] in Treatment: 18 Compression Therapy Performed for Wound Assessment: Wound #16 Right,Medial Lower Leg Performed By: Clinician Levora Dredge, RN Compression Type: Three Layer Post Procedure Diagnosis Same as Pre-procedure Electronic Signature(s) Signed: 04/05/2022 3:29:27 PM By: Levora Dredge Entered By: Levora Dredge on 04/05/2022 15:29:27 SHANTAE, VANTOL ([id number]) -------------------------------------------------------------------------------- Encounter Discharge Information Details Patient Name: [name], [name]. Date of Service: 04/05/2022 2:30 PM Medical Record Number: [id number] Patient Account Number: [id number] Date of Birth/Sex: Jul 18, 1946 (76 y.o. F) Treating RN: Levora Dredge Primary Care Edrik Rundle: [name] Other Clinician: Referring Dao Mearns: [name] Treating Jacqulynn Shappell/Extender: [name] in Treatment: 18 Encounter Discharge Information Items Discharge Condition: Stable Ambulatory Status: Ambulatory Discharge Destination: Home Transportation: Private Auto Accompanied By: self Schedule Follow-up Appointment: Yes Clinical Summary of Care: Electronic Signature(s) Signed: 04/05/2022 3:50:43 PM By: Levora Dredge Entered By: Levora Dredge on 04/05/2022 15:08:36 Steely,  [name] ([id number]) -------------------------------------------------------------------------------- Lower Extremity Assessment Details Patient Name: [name], [name]. Date of Service: 04/05/2022 2:30 PM Medical Record Number: [id number] Patient Account Number: [id number] Date of Birth/Sex: 17-Feb-1946 (76 y.o. F) Treating RN: Levora Dredge Primary Care Tarrell Debes: [name] Other Clinician: Referring Tyrica Afzal: [name] Treating Lantz Hermann/Extender: Jeri Cos Weeks in Treatment: 18 Edema Assessment Assessed: [Left: No] [Right: No] Edema: [Left: Ye] [Right: s] Calf Left: Right: Point of Measurement: 30 cm From Medial Instep 48 cm Ankle Left: Right: Point of Measurement: 13 cm From Medial Instep 25 cm Vascular Assessment Pulses: Dorsalis Pedis Palpable: [Right:Yes] Electronic Signature(s) Signed: 04/05/2022 3:50:43 PM By: Levora Dredge Entered By: Levora Dredge on 04/05/2022 14:48:45 [place], [name] ([id number]) -------------------------------------------------------------------------------- Multi Wound Chart Details Patient Name: [name], [name]. Date of Service: 04/05/2022 2:30 PM Medical Record Number: [id number] Patient Account Number: [id number] Date of Birth/Sex: 1946-04-08 (76 y.o. F) Treating RN: Levora Dredge Primary Care Venkat Ankney: [name] Other Clinician: Referring Flecia Shutter: [name] Treating Darcel Frane/Extender: [name] in Treatment: 18 Vital Signs Height(in): 62 Pulse(bpm): 78 Weight(lbs): 199 Blood Pressure(mmHg): 154/72 Body Mass Index(BMI): 36.4 Temperature(F): 98.1 Respiratory Rate(breaths/min): 18 Photos: [N/A:N/A] Wound Location: Right, Medial Lower Leg N/A N/A Wounding Event: Gradually Appeared N/A N/A Primary Etiology: Lymphedema N/A N/A Secondary Etiology: Venous Leg Ulcer N/A N/A Comorbid History: Cataracts, Asthma, Sleep Apnea, N/A N/A Deep Vein Thrombosis, Hypertension, Peripheral Venous Disease,  Osteoarthritis, Received Chemotherapy, Received Radiation Date Acquired: 10/26/2021 N/A N/A Weeks of Treatment: 14 N/A N/A Wound Status: Open N/A N/A Wound Recurrence: No N/A N/A Measurements L x W x D (cm) 0.1x0.1x0.1 N/A N/A Area (cm) : 0.008 N/A N/A Volume (cm) : 0.001 N/A N/A % Reduction in Area: 99.50% N/A N/A % Reduction in Volume: 99.40% N/A N/A Classification: Full Thickness Without Exposed N/A N/A Support Structures Exudate Amount: None Present N/A N/A Granulation Amount: None Present (0%) N/A N/A Necrotic Amount: None Present (0%) N/A N/A Exposed Structures: Fascia: No N/A N/A Fat Layer (Subcutaneous Tissue): No Tendon: No Muscle: No Joint: No Bone: No Epithelialization: Large (67-100%) N/A N/A ROBBIN, LOUGHMILLER ([id number]) Treatment Notes Electronic Signature(s) Signed: 04/05/2022 3:50:43 PM By: Levora Dredge Entered By: Levora Dredge on 04/05/2022 14:49:00 Steffenhagen, [name] ([id number]) -------------------------------------------------------------------------------- Multi-Disciplinary Care Plan Details Patient Name: [name], [name]. Date of  Service: 04/05/2022 2:30 PM Medical Record Number: [id number] Patient Account Number: [id number] Date of Birth/Sex: 16-Feb-1946 (76 y.o. F) Treating RN: Levora Dredge Primary Care Simpson Paulos: [name] Other Clinician: Referring Demetry Bendickson: [name] Treating Iyonnah Ferrante/Extender: [name] in Treatment: 18 Active Inactive Wound/Skin Impairment Nursing Diagnoses: Impaired tissue integrity Knowledge deficit related to smoking impact on wound healing Knowledge deficit related to ulceration/compromised skin integrity Goals: Patient/caregiver will verbalize understanding of skin care regimen Date Initiated: 11/27/2021 Date Inactivated: 01/15/2022 Target Resolution Date: 12/25/2021 Goal Status: Met Ulcer/skin breakdown will have a volume reduction of 30% by week 4 Date Initiated: 11/27/2021 Date Inactivated:  03/08/2022 Target Resolution Date: 12/25/2021 Goal Status: Met Ulcer/skin breakdown will have a volume reduction of 50% by week 8 Date Initiated: 11/27/2021 Date Inactivated: 03/08/2022 Target Resolution Date: 01/22/2022 Goal Status: Met Ulcer/skin breakdown will have a volume reduction of 80% by week 12 Date Initiated: 11/27/2021 Target Resolution Date: 02/22/2022 Goal Status: Active Ulcer/skin breakdown will heal within 14 weeks Date Initiated: 11/27/2021 Target Resolution Date: 03/08/2022 Goal Status: Active Interventions: Assess ulceration(s) every visit Provide education on ulcer and skin care Treatment Activities: Skin care regimen initiated : 11/27/2021 Topical wound management initiated : 11/27/2021 Notes: Electronic Signature(s) Signed: 04/05/2022 3:50:43 PM By: Levora Dredge Entered By: Levora Dredge on 04/05/2022 14:48:52 Mcnicholas, [name] ([id number]) -------------------------------------------------------------------------------- Pain Assessment Details Patient Name: [name], [name]. Date of Service: 04/05/2022 2:30 PM Medical Record Number: [id number] Patient Account Number: [id number] Date of Birth/Sex: [birth year] (76 y.o. F) Treating RN: Levora Dredge Primary Care Eular Panek: [name] Other Clinician: Referring [name] Molla: [name] Treating Griffyn [name]/Extender: [name] in Treatment: 18 Active Problems Location of Pain Severity and Description of Pain Patient Has Paino No Site Locations Rate the pain. Current Pain Level: 0 Pain Management and Medication Current Pain Management: Electronic Signature(s) Signed: 04/05/2022 3:50:43 PM By: Levora Dredge Entered By: Levora Dredge on 04/05/2022 14:40:54 Balster, [name] ([id number]) -------------------------------------------------------------------------------- Patient/Caregiver Education Details Patient Name: [name], [name]. Date of Service: 04/05/2022 2:30 PM Medical Record Number:  [id number] Patient Account Number: [id number] Date of Birth/Gender: 1946/03/22 (76 y.o. F) Treating RN: Levora Dredge Primary Care Physician: [name] Other Clinician: Referring Physician: [name] Treating Physician/Extender: [name] in Treatment: 18 Education Assessment Education Provided To: Patient Education Topics Provided Wound/Skin Impairment: Handouts: Caring for Your Ulcer Methods: Explain/Verbal Responses: State content correctly Electronic Signature(s) Signed: 04/05/2022 3:50:43 PM By: Levora Dredge Entered By: Levora Dredge on 04/05/2022 15:08:01 ARIYANA, FAW ([id number]) -------------------------------------------------------------------------------- Wound Assessment Details Patient Name: [name], [name]. Date of Service: 04/05/2022 2:30 PM Medical Record Number: [id number] Patient Account Number: [id number] Date of Birth/Sex: [birth year] (76 y.o. F) Treating RN: Levora Dredge Primary Care Kari Kerth: [name] Other Clinician: Referring Selinda Korzeniewski: [name] Treating Makell Drohan/Extender: [name] in Treatment: 18 Wound Status Wound Number: 16 Primary Lymphedema Etiology: Wound Location: Right, Medial Lower Leg Secondary Venous Leg Ulcer Wounding Event: Gradually Appeared Etiology: Date Acquired: 10/26/2021 Wound Open Weeks Of Treatment: 14 Status: Clustered Wound: No Comorbid Cataracts, Asthma, Sleep Apnea, Deep Vein Thrombosis, History: Hypertension, Peripheral Venous Disease, Osteoarthritis, Received Chemotherapy, Received Radiation Photos Wound Measurements Length: (cm) 0.1 Width: (cm) 0.1 Depth: (cm) 0.1 Area: (cm) 0.008 Volume: (cm) 0.001 % Reduction in Area: 99.5% % Reduction in Volume: 99.4% Epithelialization: Large (67-100%) Tunneling: No Undermining: No Wound Description Classification: Full Thickness Without Exposed Support Structures Exudate Amount: None Present Foul Odor After  Cleansing: No Slough/Fibrino Yes Wound Bed Granulation Amount: None Present (0%) Exposed Structure Necrotic Amount: None  Present (0%) Fascia Exposed: No Fat Layer (Subcutaneous Tissue) Exposed: No Tendon Exposed: No Muscle Exposed: No Joint Exposed: No Bone Exposed: No Treatment Notes Wound #16 (Lower Leg) Wound Laterality: Right, Medial Cleanser Soap and Water Discharge Instruction: Gently cleanse wound with antibacterial soap, rinse and pat dry prior to dressing wounds Wound Cleanser [name], [name] ([id number]) Discharge Instruction: Wash your hands with soap and water. Remove old dressing, discard into plastic bag and place into trash. Cleanse the wound with Wound Cleanser prior to applying a clean dressing using gauze sponges, not tissues or cotton balls. Do not scrub or use excessive force. Pat dry using gauze sponges, not tissue or cotton balls. Peri-Wound Care Topical Primary Dressing Xeroform-HBD 2x2 (in/in) Discharge Instruction: Apply Xeroform-HBD 2x2 (in/in) as directed Secondary Dressing Gauze Secured With Compression Wrap 3-LAYER WRAP - Profore Lite LF 3 Multilayer Compression Bandaging System Discharge Instruction: Apply 3 multi-layer wrap as prescribed. Compression Stockings Add-Ons Electronic Signature(s) Signed: 04/05/2022 3:50:43 PM By: Levora Dredge Entered By: Levora Dredge on 04/05/2022 14:48:20 Rick, [name] ([id number]) -------------------------------------------------------------------------------- Vitals Details Patient Name: [name], WETHERELL. Date of Service: 04/05/2022 2:30 PM Medical Record Number: [id number] Patient Account Number: [id number] Date of Birth/Sex: September 22, 1946 (76 y.o. F) Treating RN: Levora Dredge Primary Care Nakeia Calvi: [name] Other Clinician: Referring Evian [name]: [name] Treating Verne Cove/Extender: [name] in Treatment: 18 Vital Signs Time Taken: 14:38 Temperature (F): 98.1 Height (in):  62 Pulse (bpm): 78 Weight (lbs): 199 Respiratory Rate (breaths/min): 18 Body Mass Index (BMI): 36.4 Blood Pressure (mmHg): 154/72 Reference Range: 80 - 120 mg / dl Electronic Signature(s) Signed: 04/05/2022 3:50:43 PM By: Levora Dredge Entered By: Levora Dredge on 04/05/2022 14:40:48

## 2022-04-05 NOTE — Progress Notes (Addendum)
[name], [name] ([id number]) Visit Report for 04/05/2022 Chief Complaint Document Details Patient Name: [name], [name]. Date of Service: 04/05/2022 2:30 PM Medical Record Number: [id number] Patient Account Number: [id number] Date of Birth/Sex: [birth year] (76 y.o. F) Treating RN: Cornell Barman Primary Care Provider: [name] Other Clinician: Referring Provider: [name] Treating Provider/Extender: [name] in Treatment: 18 Information Obtained from: Patient Chief Complaint Right LE Ulcer Electronic Signature(s) Signed: 04/05/2022 2:49:38 PM By: Worthy Keeler PA-C Entered By: Worthy Keeler on 04/05/2022 14:49:38 Sedgwick, [name] ([id number]) -------------------------------------------------------------------------------- HPI Details Patient Name: [name], [name]. Date of Service: 04/05/2022 2:30 PM Medical Record Number: [id number] Patient Account Number: [id number] Date of Birth/Sex: 1946/02/27 (76 y.o. F) Treating RN: Cornell Barman Primary Care Provider: [name] Other Clinician: Referring Provider: [name] Treating Provider/Extender: [name] in Treatment: 18 History of Present Illness HPI Description: Pleasant 76 year old with history of chronic venous insufficiency. No diabetes or peripheral vascular disease. Left ABI 1.29. Questionable history of left lower extremity DVT. She developed a recurrent ulceration on her left lateral calf in December 2015, which she attributes to poor diet and subsequent lower extremity edema. She underwent endovenous laser ablation of her left greater saphenous vein in 2010. She underwent laser ablation of accessory branch of left GSV in April 2016 by Dr. Kellie Simmering at [facility]. She was previously wearing Unna boots, which she tolerated well. Tolerating 2 layer compression and cadexomer iodine. She returns to clinic for follow-up and is without new complaints. She denies any significant pain at this time. She  reports persistent pain with pressure. No claudication or ischemic rest pain. No fever or chills. No drainage. READMISSION 11/13/16; this is a 76 year old woman who is not a diabetic. She is here for a review of a painful area on her left medial lower extremity. I note that she was seen here previously last year for wound I believe to be in the same area. At that time she had undergone previously a left greater saphenous vein ablation by Dr. Kellie Simmering and she had a ablation of the anterior accessory branch of the left greater saphenous vein in March 2016. Seeing that the wound actually closed over. In reviewing the history with her today the ulcer in this area has been recurrent. She describes a biopsy of this area in 2009 that only showed stasis physiology. She also has a history of today malignant melanoma in the right shoulder for which she follows with Dr. 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 Do not see that she has ever had arterial studies in the left leg. She tells me she has a follow-up with Dr. Kellie Simmering in roughly 10 days In any case she developed the reopening of this area roughly a month ago. On the background of this she describes rapidly increasing edema which has responded to Lasix 40 mg and metolazone 2.5 mg as well as the patient's lymph massage. She has been told she has both venous insufficiency and lymphedema but she cannot tolerate compression stockings 11/28/16; the patient saw Dr. Kellie Simmering recently. Per the patient he did arterial Dopplers in the office that did not show evidence of arterial insufficiency, per the patient he stated "treat this like an ordinary venous ulcer". She also saw her dermatologist Dr. Ronnald Ramp who felt that this was more of a vascular ulcer. In general things are improving although she  arrives today with increasing bilateral lower extremity edema  with weeping a deeper fluid through the wound on the left medial leg compatible with some degree of lymphedema 12/04/16; the patient's wound is fully epithelialized but I don't think fully healed. We will do another week of depression with Promogran and TCA however I suspect we'll be able to discharge her next week. This is a very unusual-looking wound which was initially a figure-of-eight type wound lying on its side surrounded by petechial like hemorrhage. She has had venous ablation on this side. She apparently does not have an arterial issue per Dr. Kellie Simmering. She saw her dermatologist thought it was "vascular". Patient is definitely going to need ongoing compression and I talked about this with her today she will go to elastic therapy after she leaves here next week 12/11/16; the patient's wound is not completely closed today. She has surrounding scar tissue and in further discussion with the patient it would appear that she had ulcers in this area in 2009 for a prolonged period of time ultimately requiring a punch biopsy of this area that only showed venous insufficiency. I did not previously pickup on this part of the history from the patient. 12/18/16; the patient's wound is completely epithelialized. There is no open area here. She has significant bilateral venous insufficiency with secondary lymphedema to a mild-to-moderate degree she does not have compression stockings.. She did not say anything to me when I was in the room, she told our intake nurse that she was still having pain in this area. This isn't unusual recurrent small open area. She is going to go to elastic therapy to obtain compression stockings. 12/25/16; the patient's wound is fully epithelialized. There is no open area here. The patient describes some continued episodic discomfort in this area medial left calf. However everything looks fine and healed here. She is been to elastic therapy and caught herself 15-20 mmHg  stockings, they apparently were having trouble getting 20-30 mm stockings in her size 01/22/17; this is a patient we discharged from the clinic a month ago. She has a recurrent open wound on her medial left calf. She had 15 mm support stockings. I told her I thought she needed 20-30 mm compression stockings. She tells me that she has been ill with hospitalization secondary to asthma and is been found to have severe hypokalemia likely secondary to a combination of Lasix and metolazone. This morning she noted blistering and leaking fluid on the posterior part of her left leg. She called our intake nurse urgently and we was saw her this afternoon. She has not had any real discomfort here. I don't know that she's been wearing any stockings on this leg for at least 2-3 days. ABIs in this clinic were 1.21 on the right and 1.3 on the left. She is previously seen vascular surgery who does not think that there is a peripheral arterial issue. 01/30/17; Patient arrives with no open wound on the left leg. She has been to elastic therapy and obtained 20-66mhg below knee stockings and she has one on the right leg today. READMISSION 02/19/18; this PHoranis a now 76year old patient we've had in this clinic perhaps 3 times before. I had last looked at her from January 07 December 2016 with an area on the medial left leg. We discharged her on 12/25/16 however she had to be readmitted on 01/22/17 with a recurrence. I have in my notes that we discharged her on 20-30 mm stockings although she tells me  she was only wearing support hose because she cannot get stockings on predominantly related to her cervical spine surgery/issues. She has had previous ablations done by vein and vascular in [place] including a great saphenous vein ablation on the left with an anterior accessory branch ablation I think both of these were in 2016. On one of the previous visit she had a biopsy noted 2009 that was negative. She is not felt to  have an arterial issue. She is not a diabetic. She does have a history of obstructive sleep apnea hypertension asthma as well as chronic venous insufficiency and lymphedema. On this occasion she noted 2 dry scaly patch on her left leg. She tried to put lotion on this it didn't really help. There were 2 open areas.the patient has been seeing her primary physician from 02/05/18 through 02/14/18. She had Unna boots applied. The superior wound now on the lateral left leg has closed but she's had one wound that remains open on the lateral left leg. This is not the same spot as we dealt with in 2018. ABIs in this clinic were 1.3 bilaterally MERRIL, ISAKSON ([id number]) 02/26/18; patient has a small wound on the left lateral calf. Dimensions are down. She has chronic venous insufficiency and lymphedema. 03/05/18; small open area on the left lateral calf. Dimensions are down. Tightly adherent necrotic debris over the surface of the wound which was difficult to remove. Also the dressing [over collagen] stuck to the wound surface. This was removed with some difficulty as well. Change the primary dressing to Hydrofera Blue ready 03/12/18; small open area on the left lateral calf. Comes in with tightly adherent surface eschar as well as some adherent Hydrofera Blue. 03/19/18; open area on the left lateral calf. Again adherent surface eschar as well as some adherent Hydrofera Blue nonviable subcutaneous tissue. She complained of pain all week even with the reduction from 4-3 layer compression I put on last week. Also she had an increase in her ankle and calf measurements probably related to the same thing. 03/26/18; open area on the left lateral calf. A very small open area remains here. We used silver alginate starting last week as the Hydrofera Blue seem to stick to the wound bed. In using 4-layer compression 04/02/18; the open area in the left lateral calf at some adherent slough which I removed there is no open area  here. We are able to transition her into her own compression stocking. Truthfully I think this is probably his support hose. However this does not maintain skin integrity will be limited. She cannot put over the toe compression stockings on because of neck problems hand problems etc. She is allergic to the lining layer of juxta lites. We might be forced to use extremitease stocking should this fail READMIT 11/24/2018 Patient is now a 76 year old woman who is not a diabetic. She has been in this clinic on at least 3 previous occasions largely with recurrent wounds on her left leg secondary to chronic venous insufficiency with secondary lymphedema. Her situation is complicated by inability to get stockings on and an allergy to neoprene which is apparently a component and at least juxta lites and other stockings. As a result she really has not been wearing any stockings on her legs. She tells [place] that roughly 2 or 3 weeks ago she started noticing a stinging sensation just above her ankle on the left medial aspect. She has been diagnosed with pseudogout and she wondered whether this was what she was  experiencing. She tried to dress this with something she bought at the store however subsequently it pulled skin off and now she has an open wound that is not improving. She has been using Vaseline gauze with a cover bandage. She saw her primary doctor last week who put an Haematologist on her. ABIs in this clinic was 1.03 on the left 2/12; the area is on the left medial ankle. Odd-looking wound with what looks to be surface epithelialization but a multitude of small petechial openings. This clearly not closed yet. We have been using silver alginate under 3 layer compression with TCA 2/19; the wound area did not look quite as good this week. Necrotic debris over the majority of the wound surface which required debridement. She continues to have a multitude of what looked to be small petechial openings. She  reminds [place] that she had a biopsy on this initially during her first outbreak in 2015 in [place] [facility]. She expresses concern about this being a possible melanoma. She apparently had a nodular melanoma up on her shoulder that was treated with excision, lymph node removal and ultimately radiation. I assured her that this does not look anything like melanoma. Except for the petechial reaction it does look like a venous insufficiency area and she certainly has evidence of this on both sides 2/26; a difficult area on the left medial ankle. The patient clearly has chronic venous hypertension with some degree of lymphedema. The odd thing about the area is the small petechial hemorrhages. I am not really sure how to explain this. This was present last time and this is not a compression injury. We have been using Hydrofera Blue which I changed to last week 3/4; still using Hydrofera Blue. Aggressive debridement today. She does not have known arterial issues. She has seen Dr. Kellie Simmering at [facility] vein and vascular and and has an ablation on the left. [Anterior accessory branch of the greater saphenous]. From what I remember they did not feel she had an arterial issue. The patient has had this area biopsied in 2009 at [facility] dermatology and by her recollection they said this was "stasis". She is also follow-up with dermatology locally who thought that this was more of a vascular issue 3/11; using Hydrofera Blue. Aggressive debridement today. She does not have an arterial issue. We are using 3 layer compression although we may need to go to 4. The patient has been in for multiple changes to her wrap since I last saw her a week ago. She says that the area was leaking. I do not have too much more information on what was found 01/19/19 on evaluation today patient was actually being seen for a nurse visit when unfortunately she had the area on her left lateral lower extremity as well as weeping from the  right lower extremity that became apparent. Therefore we did end up actually seeing her for a full visit with myself. She is having some pain at this site as well but fortunately nothing too significant at this point. No fevers, chills, nausea, or vomiting noted at this time. 3/18-Patient is back to the clinic with the left leg venous leg ulcer, the ulcer is larger in size, has a surface that is densely adherent with fibrinous tissue, the Hydrofera Blue was used but is densely adherent and there was difficulty in removing it. The right lower extremity was also wrapped for weeping edema. Patient has a new area over the left lateral foot above the malleolus that is  small and appears to have no debris with intact surrounding skin. Patient is on increased dose of Lasix also as a means to edema management 3/25; the patient has a nonhealing venous ulcer on the medial left leg and last week developed a smaller area on the lateral left calf. We have been using Hydrofera Blue with a contact layer. 4/1; no major change in these wounds areas. Left medial and more recently left lateral calf. I tried Iodoflex last week to aid in debridement she did not tolerate this. She stated her pain was terrible all week. She took the top layer of the 4 layer compression off. 4/8; the patient actually looks somewhat better in terms of her more prominent left lateral calf wound. There is some healthy looking tissue here. She is still complaining of a lot of discomfort. 4/15; patient in a lot of pain secondary to sciatica. She is on a prednisone taper prescribed by her primary physician. She has the 2 areas one on the left medial and more recently a smaller area on the left lateral calf. Both of these just above the malleoli 4/22; her back pain is better but she still states she is very uncomfortable and now feels she is intolerant to the [employer]. No real change in the wounds we have been using Sorbact. She has been  previously intolerant to Iodoflex. There is not a lot of option about what we can use to debride this wound under compression that she no doubt needs. sHe states Ultram no longer works for her pain 4/29; no major change in the wounds slightly increased depth. Surface on the original medial wound perhaps somewhat improved however the more recent area on the lateral left ankle is 100% covered in very adherent debris we have been using Sorbact. She tolerates 4 layer compression well and her edema control is a lot better. She has not had to come in for a nurse check 5/6; no major change in the condition of the wounds. She did consent to debridement today which was done with some difficulty. Continuing Sorbact. She did not tolerate Iodoflex. She was in for a check of her compression the day after we wrapped her last week this was adjusted but nothing much was found 5/13; no major change in the condition or area of the wounds. I was able to get a fairly aggressive debridement done on the lateral left leg wound. Even using Sorbact under compression. She came back in on Friday to have the wrap changed. She says she felt uncomfortable on the lateral aspect of her ankle. She has a long history of chronic venous insufficiency including previous ablation surgery on this side. 5/20-Patient returns for wounds on left leg with both wounds covered in slough, with the lateral leg wound larger in size, she has been in 3 layer compression and felt more comfortable, she describes pain in ankle, in leg and pins and needles in foot, and is about to try Pamelor for this 6/3; wounds on the left lateral and left medial leg. The area medially which is the most recent of the 2 seems to have had the largest increase in [place]wick, DAWSON HOLLMAN. ([id number]) dimensions. We have been using Sorbac to try and debride the surface. She has been to see orthopedics they apparently did a plain x-ray that was indeterminant. Diagnosed her with  neuropathy and they have ordered an MRI to determine if there is underlying osteomyelitis. This was not high on my thought list but I suppose  it is prudent. We have advised her to make an appointment with vein and vascular in [place]. She has a history of a left greater saphenous and accessory vein ablations I wonder if there is anything else that can be done from a surgical point of view to help in these difficult refractory wounds. We have previously healed this wound on one occasion but it keeps on reopening [medial side] 6/10; deep tissue culture I did last week I think on the left medial wound showed both moderate E. coli and moderate staph aureus [MSSA]. She is going to require antibiotics and I have chosen Augmentin. We have been using Sorbact and we have made better looking wound surface on both sides but certainly no improvement in wound area. She was back in last Friday apparently for a dressing changes the wrap was hurting her outer left ankle. She has not managed to get a hold of vein and vascular in [place]. We are going to have to make her that appointment 6/17; patient is tolerating the Augmentin. She had an MRI that I think was ordered by orthopedic surgeon this did not show osteomyelitis or an abscess did suggest cellulitis. We have been using Sorbact to the lateral and medial ankles. We have been trying to arrange a follow-up appointment with vein and vascular in [place] or did her original ablations. We apparently an area sent the request to vein and vascular in [facility] 6/24; patient has completed the Augmentin. We do not yet have a vein and vascular appointment in [place]. I am not sure what the issue is here we have asked her to call tomorrow. We are using Sorbact. Making some improvements and especially the medial wound. Both surfaces however look better medial and lateral. 7/1; the patient has been in contact with vein and vascular in [place] but has not yet  received an appointment. Using Sorbact we have gradually improve the wound surface with no improvement in surface area. She is approved for Apligraf but the wound surface still is not completely viable. She has not had to come in for a dressing change 7/8; the patient has an appointment with vein and vascular on 7/31 which is a Friday afternoon. She is concerned about getting back here for [place] to dress her wounds. I think it is important to have them goal for her venous reflux/history of ablations etc. to see if anything else can be done. She apparently tested positive for 1 of the blood tests with regards to lupus and saw a rheumatologist. He has raised the issue of vasculitis again. I have had this thought in the past however the evidence seems overwhelming that this is a venous reflux etiology. If the rheumatologist tells me there is clinical and laboratory investigation is positive for lupus I will rethink this. 7/15; the patient's wound surfaces are quite a bit better. The medial area which was her original wound now has no depth although the lateral wound which was the more recent area actually appears larger. Both with viable surfaces which is indeed better. Using Sorbact. I wanted to use Apligraf on her however there is the issue of the vein and vascular appointment on 7/31 at 2:00 in the afternoon which would not allow her to get back to be rewrapped and they would no doubt remove the graft 7/22; the patient's wound surfaces have moderate amount of debris although generally look better. The lateral one is larger with 2 small satellite areas superiorly. We are waiting for her vein and  vascular appointment on 7/31. She has been approved for Apligraf which I would like to use after th 7/29; wound surfaces have improved no debridement is required we have been using Sorbact. She sees vein and vascular on Friday with this so question of whether anything can be done to lessen the likelihood of  recurrence and/or speed the healing of these areas. She is already had previous ablations. She no doubt has severe venous hypertension 8/5-Patient returns at 1 week, she was in [place] for 3 days by her podiatrist, we have been using so backed to the wound, she has increased pain in both the wounds on the left lower leg especially the more distal one on the lateral aspect 8/12-Patient returns at 1 week and she is agreeable to having debridement in both wounds on her left leg today. We have been using Sorbact, and vascular studies were reviewed at last visit 8/19; the patient arrives with her wounds fairly clean and no debridement is required. We have used Sorbact which is really done a nice job in cleaning up these very difficult wound surfaces. The patient saw Dr. Donzetta Matters of vascular surgery on 7/31. He did not feel that there was an arterial component. He felt that her treated greater saphenous vein is adequately addressed and that the small saphenous vein did not appear to be involved significantly. She was also noted to have deep venous reflux which is not treatable. Dr. Donzetta Matters mentioned the possibility of a central obstructive component leading to reflux and he offered her central venography. She wanted to discuss this or think about it. I have urged her to go ahead with this. She has had recurrent difficult wounds in these areas which do heal but after months in the clinic. If there is anything that can be done to reduce the likelihood of this I think it is worth it. 9/2 she is still working towards getting follow-up with Dr. Donzetta Matters to schedule her CT. Things are quite a bit worse venography. I put Apligraf on 2 weeks ago on both wounds on the medial and lateral part of her left lower leg. She arrives in clinic today with 3 superficial additional wounds above the area laterally and one below the wound medially. She describes a lot of discomfort. I think these are probably wrapped injuries. Does not  look like she has cellulitis. 07/20/2019 on evaluation today patient appears to be doing somewhat poorly in regard to her lower extremity ulcers. She in fact showed signs of erythema in fact we may even be dealing with an infection at this time. Unfortunately I am unsure if this is just infection or if indeed there may be some allergic reaction that occurred as a result of the Apligraf application. With that being said that would be unusual but nonetheless not impossible in this patient is one who is unfortunately allergic to quite a bit. Currently we have been using the Sorbact which seems to do as well as anything for her. I do think we may want to obtain a culture today to see if there is anything showing up there that may need to be addressed. 9/16; noted that last week the wounds look worse in 1 week follow-up of the Apligraf. Using Sorbact as of 2 days ago. She arrives with copious amounts of drainage and new skin breakdown on the back of the left calf. The wounds arm more substantial bilaterally. There is a fair amount of swelling in the left calf no overt DVT there  is edema present I think in the left greater than right thigh. She is supposed to go on 9/28 for CT venography. The wounds on the medial and lateral calf are worse and she has new skin breakdown posteriorly at least new for me. This is almost developing into a circumferential wound area The Apligraf was taken off last week which I agree with things are not going in the right direction a culture was done we do not have that back yet. She is on Augmentin that she started 2 days ago 9/23; dressing was changed by her nurses on Monday. In general there is no improvement in the wound areas although the area looks less angry than last week. She did get Augmentin for MSSA cultured on the 14th. She still appears to have too much swelling in the left leg even with 3 layer compression 9/30; the patient underwent her procedure on 9/28 by Dr.  Donzetta Matters at [facility]. She was discovered to have the common iliac vein measuring 12.2 mm but at the level of L4-L5 measured 3 mm. After stenting it measured 10 mm. 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 Rouleaux flow in the common femoral and femoral vein was observed much improved after stenting. We are using silver alginate to the wounds on the medial and lateral ankle on the left. 4 layer compression 10/7; the patient had fluid swelling around her knee and 4 layer compression. At the advice of vein and vascular this was reduced to 3 layer which she is tolerating better. We have been using silver alginate under 3 layer compression since last Friday 10/14; arrives with the areas on the left ankle looking a lot better. Inflammation in the area also a lot better. She came in for a nurse check on 10/9 10/21; continued nice improvement. Slight improvements in surface area of both the medial and lateral wounds on the left. A lot of the satellite lesions in the weeping erythema around these from stasis dermatitis is resolved. We have been using silver alginate MYDA, DETWILER ([id number]) 10/28; general improvement in the entire wound areas although not a lot of change in dimensions the wound certainly looks better. There is a lot less in terms of venous inflammation. Continue silver alginate this week however look towards Hydrofera Blue next week 11/4; very adherent debris on the medial wound left wound is not as bad. We have been using silver alginate. Change to [facility] today 11/11; very adherent debris on both wound areas. She went to vein and vascular last week and follow-up they put in [place] boot on this today. He says the [facility] was adherent. Wound is definitely not as good as last week. Especially on the left there the satellite lesions look more prominent 11/18; absolutely no better. erythema on lateral aspect with tenderness. 09/30/2019 on  evaluation today patient appears to actually be doing better. Dr. Dellia Nims did put her on doxycycline last week which I do believe has helped her at this point. Fortunately there is no signs of active infection at this time. No fevers, chills, nausea, vomiting, or diarrhea. I do believe he may want extend the doxycycline for 7 additional days just to ensure everything does completely cleared up the patient is in agreement with that plan. Otherwise she is going require some sharp debridement today 12/2; patient is completing a 2-week course of doxycycline. I gave her this empirically for inflammation as well as infection when I last saw her  2 weeks ago. All of this seems to be better. She is using silver alginate she has the area on the medial aspect of the larger area laterally and the 2 small satellite regions laterally above the major wound. 12/9; the patient's wound on the left medial and left lateral calf look really quite good. We have been using silver alginate. She saw vein and vascular in follow-up on 10/09/2019. She has had a previous left greater saphenous vein ablation by Dr. Oscar La in 2016. More recently she underwent a left common iliac vein stent by Dr. 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 The swelling is improved and certainly the wounds have improved. The patient shows [place] today area on the right medial calf there is almost no wound but leaking lymphedema. She says she start this started 3 or 4 days ago. She did not traumatize it. It is not painful. She does not wear compression on that side 12/16; the patient continues to do well laterally. Medially still requiring debridement. The area on the right calf did not materialize to anything and is not currently open. We wrapped this last time. She has support stockings for that leg although I am not sure they are going to provide adequate compression 12/23; the lateral wound looks stable. Medially still requiring debridement for  tightly adherent fibrinous debris. We've been using silver alginate. Surface area not any different 12/30; neither wound is any better with regards to surface and the area on the left lateral is larger. I been using silver alginate to the left lateral which look quite good last week and Sorbact to the left medial 11/11/2019. Lateral wound area actually looks better and somewhat smaller. Medial still requires a very aggressive debridement today. We have been using Sorbact on both wound areas 1/13; not much better still adherent debris bilaterally. I been using Sorbact. She has severe venous hypertension. Probably some degree of dermal fibrosis distally. I wonder whether tighter compression might help and I am going to try that today. We also need to work on the bioburden 1/20; using Sorbact. She has severe venous hypertension status post stent placement for pelvic vein compression. We applied gentamicin last time to see if we could reduce bioburden I had some discussion with her today about the use of pentoxifylline. This is occasionally used in this setting for wounds with refractory venous insufficiency. However this interacts with Plavix. She tells me that she was put on this after stent placement for 3 months. She will call Dr. Claretha Cooper office to discuss 1/27; we are using gentamicin under Sorbact. 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 She has a stent. Wound medially is measuring smaller this week. Laterally measuring slightly larger although she has some satellite lesions superiorly 2/3; gentamicin under Sorbact under 4-layer compression. 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 She has a stent on Plavix. Her wounds are measuring smaller this week. More substantially laterally where there is a satellite lesion superiorly. 2/10; gentamicin under Sorbac. 4-layer compression. Patient communicated with Dr. Donzetta Matters at vein and vascular in  [place]. He is okay with the patient coming off Plavix I will therefore start her on pentoxifylline for a 1 month trial. In general her wounds look better today. I had some concerns about swelling in the left thigh however she measures 61.5 on the right and 63 on the mid thigh which does not suggest there is any difficulty. The patient is not describing any pain. 2/17; gentamicin under  Sorbac 4-layer compression. She has been on pentoxifylline for 1 week and complains of loose stool. No nausea she is eating and drinking well 2/24; the patient apparently came in 2 days ago for a nurse visit when her wrap fell down. Both areas look a little worse this week macerated medially and satellite lesions laterally. Change to silver alginate today 3/3; wounds are larger today especially medially. She also has more swelling in her foot lower leg and I even noted some swelling in her posterior thigh which is tender. I wonder about the patency of her stent. Fortuitously she sees Dr. Claretha Cooper group on Friday 3/10; Mrs. Maciolek was seen by vein and vascular on 3/5. The patient underwent ultrasound. There was no evidence of thrombosis involving the IVC no evidence of thrombosis involving the right common iliac vein there is no evidence of thrombosis involving the right external iliac vein the left external vein is also patent. The right common iliac vein stent appears patent bilateral common femoral veins are compressible and appear patent. I was concerned about the left common iliac stent however it looks like this is functional. She has some edema in the posterior thigh that was tender she still has that this week. I also note they had trouble finding the pulses in her left foot and booked her for an ABI baseline in 4 weeks. She will follow up in 6 months for repeat IVC duplex. The patient stopped the pentoxifylline because of diarrhea. It does not look like that was being effective in any case. I have advised her  to go back on her aspirin 81 mg tablet, vascular it also suggested this 3/17; comes in today with her wound surfaces a lot better. The excoriations from last week considerably better probably secondary to the TCA. We have been using silver alginate 3/24; comes in today with smaller wounds both medially and laterally. Both required debridement. There are 2 small satellite areas superiorly laterally. She also has a very odd bandlike area in the mid calf almost looking like there was a weakness in the wrap in a localized area. I would write this off as being this however anteriorly she has a small raised ballotable area that is very tender almost reminiscent of an abscess but there was no obvious purulent surface to it. 02/04/20 upon evaluation today patient appears to be doing fairly well in regard to her wounds today. Fortunately there is no signs of active infection at this time. No fevers, chills, nausea, vomiting, or diarrhea. She has been tolerating the dressing changes without complication. Fortunately I feel like she is showing signs of improvement although has been sometime since have seen her. Nonetheless the area of concern that Dr. Dellia Nims had last week where she had possibly an area of the wrap that was we can allow the leg to bulge appears to be doing significantly better today there is no signs of anything worsening. JOUA, BAKE ([id number]) 4/7; the patient's wounds on her medial and lateral left leg continue to contract. We have been using a regular alginate. Last week she developed an area on the right medial lower leg which is probably a venous ulcer as well. 4/14; the wounds on her left medial and lateral lower leg continue to contract. Surface eschar. We have been using regular alginate. The area on the right medial lower leg is closed. We have been putting both legs under 4-layer contraction. The patient went back to see vein and vascular she had arterial studies done  which  were apparently "quite good" per the patient although I have not read their notes I have never felt she had an arterial issue. 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 This is been longstanding and refractory to exercise, leg elevation and longstanding use of compression wraps in our clinic as well as compression stockings on the times we have been able to get these to heal 4/21; we thought she actually might be close this week however she arrives in clinic with a lot of edema in her upper left calf and into her posterior thigh. This is been an intermittent problem here. She says the wrap fell down but it was replaced with a nurse visit on Monday. We are using calcium alginate to the wounds and the wound sizes there not terribly larger than last week but there is a lot more edema 4/28; again wound edges are smaller on both sides. Her edema is better controlled than last time. She is obtained her compression pumps from [employer] although they have not been to her home to set these up. 5/5; left medial and left lateral both look stable. I am not sure the medial is any smaller. We have been using calcium alginate under 4-layer compression. oShe had an area on the right medial. This was eschared today. We have been wrapping this as well. She does not tolerate external compression stockings due to a history of various contact allergies. She has her compression pumps however the representative from the company is coming on her to show her how to use these tomorrow 5/19; patient with severe chronic venous insufficiency secondary to central venous disease. She had a stent placed in her left common iliac vein. She has done better since but still difficult to control wounds. She comes in today with nothing open on the right leg. Her areas on the left medial and left lateral are just about closed. We are using calcium alginate under 4-layer compression.  She is using her external compression pumps at home She only has 15-20 support stockings. States she cannot get anything tighter than that on. 03/30/20-Patient returns at 1 week, the wounds on the left leg are both slightly bigger, the last week she was on 3 layer compression which started to slide down. She is starting to use her lymphedema pumps although she stated on 1 day her right ankle started to swell up and she have to stop that day. Unfortunately the open area seem to oscillate between improving to the point of healing and then flaring up all to do with effectiveness of compression or lack of due to the left leg topography not keeping the compression wraps from rolling down 6/2 patient comes in with a 15/20 mmHg stocking on the right leg. She tells me that she developed a lot of swelling in her ankles she saw orthopedics she was felt to possibly be having a flare of pseudogout versus some other type of arthritis. She was put on steroids for a respiratory issue so that helps with the inflammation. She has not been using the pumps all week. She thinks the left thigh is more swollen than usual and I would agree with that. She has an appointment with Dr. Donzetta Matters 9 days or so from now 6/9; both wounds on the left medial and left lateral are smaller. We have been using calcium alginate under compression. She does not have an open wound on the right leg she is using a stocking and her  compression pumps things are going well. She has an appointment with Dr. Donzetta Matters with regards to her stent in the left common iliac vein 6/16; the wounds on the left medial and left lateral ankle continues to contract. The patient saw Dr. Donzetta Matters and I think he seems satisfied. Ordered follow-up venous reflux studies on both sides in September. Cautioned that she may need thigh-high stockings. She has been using calcium alginate under compression on the left and her own stocking on the right leg. She tells [place] there are no open  wounds on the right 6/23; left lateral is just about closed. Medial required debridement today. We have been using calcium alginate. Extensive discussion about the compression pumps she is only using these on 25 mmHg states she could not take 40 or 30 when the wrap came out to her home to demonstrate these. He said they should not feel tight 6/30; the left lateral wound has a slight amount of eschar. . The area medially is about the same using Hydrofera Blue. 7/7; left lateral wound still has some eschar. I will remove this next week may be closed. The area medially is very small using Hydrofera Blue with improvement. Unfortunately the stockings fell down. Unfortunately the blisters have developed at the edge of where the wrap fell. When this happened she says her legs hurt she did not use her pumps. We are not open Monday for her to come in and change the wraps and she had an appointment yesterday. She also tells me that she is going to have an MRI of her back. She is having pain radiating into her left anterior leg she thinks her from an L5 disc. She saw Dr. Ellene Route of neurosurgery 7/14; the area on the left lateral ankle area is closed. Still a small area medially however it looks better as well. We have been using Hydrofera Blue under 4-layer compression 7/21; left lateral ankle is still closed however her wound on the medial left calf is actually larger. This is probably because Hydrofera Blue got stuck to the wound. She came in for a nurse change on Friday and will do that again this week I was concerned about the amount of swelling that she had last week however she is using her compression pumps twice a day and the swelling seems well controlled 7/28; remaining wound on the left medial lower leg is smaller. We have been using moistened silver collagen under compression she is coming back for a nurse visit. For reasons that were not really clear she was just keeping her legs elevated and not  using her compression pumps. I have asked her to use the compression pumps. She does not have any wounds on the right leg 06/15/20-Patient returns at 2 weeks, her LLE edema is worse and she developed a blister wound that is new and has bigger posterior calf wound on right, we are using Prisma with pad, 4 layer compression. she has been on lasix 40 mg daily 8/18; patient arrives today with things a lot worse than I remember from a few weeks ago. She was seen last week. Noted that her edema was worse and that she now had a left lateral wound as well as deteriorating edema in the medial and posterior part of the lower leg. She says she is using his or her external compression pumps once a day although I wonder about the compliance. 8/25; weeping area on the right medial lower leg. This had actually gotten a small localized area  of her compression stocking wet. oOn the left side there is a large denuded area on the posterior medial lower leg and smaller area on the lateral. This was not the original areas that we dealt with. 9/1 the patient's wound on the left leg include the left lateral and left posterior. Larger superficial wounds weeping. She has very poor edema control. Tender localized edema in the left lower medial ankle/heel probably because of localized wrap issues. She freely admits she is not using the compression pumps. She has been up on her feet a lot. She thinks the hydrofera blue is contributing to the pain she is experiencing.. This is a complaint that I have occasionally heard 9/8; really not much improvement. The patient is still complaining of a lot of pain particularly when she uses compression pumps. I switched her to silver alginate last time because she found the Hydrofera Blue to be irritating. I don't hear much difference in her description with the silver alginate. 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 She has follow-up with Dr. Donzetta Matters I think for ultrasound next month GULIANNA, HORNSBY ([id number]) 9/15; quite a bit of improvement today. We have less edema and more epithelialization in both of her wound areas on the left medial and left lateral calf. These are not the site of her original wounds in this area. She says she has been using her compression pumps for 30 minutes twice a day, there pain issues that never quite understood. Silver alginate as the primary dressing 9/22; continued improvement. Both areas medially and laterally still have a small open area there is some eschar. She continues to complain of left medial ankle pain. Swelling in the leg is in much better condition. We have been using silver alginate 9/29; continued improvement. Both areas medially and laterally in the left calf look as though they are close some minor surface eschar but I think this is epithelialized. She comes in today saying she has a ruptured disc at L4-L5 cannot bend over to put on her stockings. 10/6; patient comes in today with no open wounds on either leg. However her edema on the left leg in the upper one third of the lower leg is poorly controlled nonpitting. She says that she could not use the pumps for 2 days and then she has been using the last couple of days. It is not clear to me she has been able to get her stocking on. She has back problems. Mrs. Scheibe has severe chronic venous insufficiency with secondary lymphedema. Her venous insufficiency is partially centrally mediated and that she is now post stent in the left common iliac vein. [facility] Thurner's syndrome/physiology]. She follows up with them on 10/15. She wears 20/30 below-knee stockings. She is supposed to use compression pumps at home although I think her compliance about with this is been less than 100%. I have asked her to use these 3 times a day. Finally I think she has lipodermatosclerosis in the left lower leg with an inverted  bottle sign. It is been a major problem controlling the edema in the left leg. The right leg we have had wounds on but not as significant a problem is on the left READMISSION 04/12/2021 Mrs. Ballester is a 76 year old woman we know well in this clinic. She has severe chronic venous insufficiency. 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 I believe she has had bilateral greater saphenous vein ablation in the past as well. She tells me that this wound opened sometime in March. She had a fall and thinks it was initially abrasion. She developed areas she describes as little blisters on the anterior part of her leg and she saw dermatology and was treated for methicillin staph aureus with several rounds of antibiotics. She has been using support stockings on the left leg and says this is the only thing she can get on. Her compression pump use maybe once a day she says if she did not use one she use the other. She comes in today with incredible swelling in the left leg with a wound on the left posterior calf. She has been using Neosporin to this previously a hydrocolloid. 6/15; patient arrives back for 1 week follow-up.Marland Kitchen Apparently her wrap fell down she did not call us to replace this. He has poor edema control. She only uses her compression pumps once a day 6/29; patient presents for 1 week follow-up. She has tolerated the compression wrap well. We have been using Iodoflex under the wrap. She has no issues or complaints today. 7/6; patient presents for 1 week follow-up. She states that the compression wrap rolled down her leg 4 days ago. She has been trying to keep the area covered but has no dressings at home to use. She denies signs of infection. 7/13; patient presents for 1 week follow-up. She states that her compression wrap rolled down her right leg and she called our office and had it placed a few days ago. She has been tolerating the current wrap well. She  states that the Iodoflex is causing a burning sensation. She denies signs of infection. 7/27; patient presents for 1 week follow-up She has tolerated the compression wrap well with Hydrofera Blue underneath. She has now developed a wound to her right lower extremity. She reports having a culture done To this area by her dermatologist that she reports is negative. She currently denies signs of infection. 8/30; patient presents for 1 week follow-up. On the left side she has tolerated the compression wrap well with Hydrofera Blue underneath. She reports some discomfort to her right lower extremity with the 3 layer compression. She currently denies signs of infection. 06/14/2021 upon evaluation today patient's wound actually appears to be doing decently well based on what I am seeing currently. She actually has 2 areas 1 on the left distal/posterior lower leg and the other on the right lower leg. Subsequently the measurements are roughly the same may be slightly smaller but in general have not made any trend towards getting worse which is great news. 06/23/2021 upon evaluation today patient appears to be doing pretty well in regard to her wounds. On the right she is having difficulty with sciatic pain and this subsequently has led to her taking the wrap off over the last week her leg is more swollen she is also been on prednisone for 10 days. With that being said I think we may want to just use a compression sock on the left that way she will not have to worry about the compression wrap being in place that she cannot get off. With that being said I do believe as well that the patient is going to need to continue with the wrapping on the left we will also need to do a little bit of sharp debridement today. 8/24; patient presents for 1 week follow-up. She has no issues  or complaints today. She denies signs of infection. 8/31; patient presents for 1 week follow-up. She has no issues or complaints today. She  has tolerated the compression wrap well on the left lower extremity. She denies signs of infection. 9/7; patient presents for follow-up. She reports that the compression wrap rolled down slightly on her right lower extremity. She reports tenderness to the wound bed. She denies increased warmth or erythema to the surrounding skin. She overall feels well. 9/14; patient presents for follow-up. She has no issues or complaints today. She denies signs of infection. PuraPly is available and patient would like to start this today. 9/21; patient presents for follow-up. She has no issues or complaints today. She tolerated the first skin substitute placement last week under compression. 9/28; patient presents for 1 week follow-up. She has no issues or complaints today. 10/5; patient presents for 1 week follow-up. She reports taking the wrap off yesterday. She was on her feet more yesterday and developed more swelling to her left lower extremity. She denies pain. Overall she is doing well. 10/12; patient presents for 1 week follow-up. She has no issues or complaints today. 10/19; patient presents for follow-up. She has no issues or complaints today. She tolerated the compression wrap well. She states she has compression stockings at home. KENSLEY, VALLADARES ([id number]) Readmission: 11/27/2021 upon evaluation today patient appears to be doing poorly in regard to the wounds on her right leg. She also has an area of erythema in the proximal calf area of the right leg which again I do believe is a issue from the standpoint of it being warm and erythematous to touch and also seems to be somewhat localized I really think this may be more of a cellulitis issue than anything else. Fortunately I do not see any signs of active infection Systemically at this time. 12/05/2021 upon evaluation patient's wounds actually appear to be showing some signs of improvement which is not too bad and just 1 week's time. Especially  since we had to get on the right antibiotic which is only been for the past 4 days. Fortunately I do not see any signs of active infection locally nor systemically at this time that has gotten any worse. Overall I think that again with the antibiotic she is significantly improved and currently she is taking Levaquin. 12/11/2021 upon evaluation today patient appears to be doing well with regard to her leg. Between the antibiotics and the triamcinolone I feel like she is doing significantly better which is great news. Overall I am extremely pleased with where we stand. There does not appear to be any signs of active infection locally or systemically at this time. 12/18/2021 upon evaluation today patient appears to be doing well with regard to her wound. I do feel like this is showing signs of some improvement here. Little by little this is clearing up quite nicely. I think it is just a slow process but nonetheless 1 that we are seeing evidence of improvement with regard to. 12/25/2021 upon evaluation today patient appears to be doing well with regard to her wounds she is definitely making some good progress here. Fortunately I do not see any signs of active infection locally or systemically at this time which is great news. No fevers, chills, nausea, vomiting, or diarrhea. 01/02/2022 upon evaluation today patient appears to be doing well with regard to her wound. She has been tolerating the dressing changes without complication. Fortunately I do not see any evidence of active infection  locally or systemically which is great news and overall I am extremely pleased with where we stand today. 01/08/2022 upon evaluation today patient appears to be doing better in regard to her ankle ulcers. She has been tolerating the dressing changes without complication. Fortunately I do not see any signs of infection and both the ankle and leg area is making great progress. 3/13; patient presents for follow-up. She has no  issues or complaints today. She was tolerated the compression wrap well. 01/19/2022 upon evaluation today patient appears to be doing well currently in regard to her right leg which overall I think is doing excellent. She had called in to try to get her in sooner for a dressing change due to an issue where her cat scratched her on the left leg. With that being said it does not appear that this was actually a scratch but rather that it was a puncture over a small varicose vein. She had a tremendous amount of bleeding as evidenced by the amount of blood in her shoes currently. The good news is this is stopped currently the bad news is that she needs to be very careful with regard to her cats later as this could definitely be a significant problem for her. Fortunately I do not see any signs of active infection locally nor systemically at this point. 02-08-2022 on evaluation today patient actually appears to be doing significantly better. She was in the hospital due to having stepped on attack on her left heel but nonetheless this is actually showing signs of improvement at this point. I do not see any evidence of active infection locally or systemically which is great news and overall I am extremely pleased with where we stand today. Her right leg is showing no signs of infection. She has been on antibiotics while in the hospital and at discharge. 02-15-2022 upon evaluation today patient appears to be doing better in regard to her wounds. Overall I feel like we are headed in the right direction. With that being said she does have an area of irritation posteriorly that was not there last week again that makes this wound posterior look little bigger but overall things are significantly better. I am very pleased in that regard. 02-23-2022 upon evaluation today patient appears to be doing well with regard to her wound. It is doing a little bit worse only and the fact that it is dry other than that things seem to  be making good improvements here. There does not appear to be any signs of active infection at this time which is great news. 4/27; right lateral leg 4-5 small open areas with eschar on top. I used a curette to remove this. She has been using Xeroform under 3 layer compression. 03-08-2022 upon evaluation today patient appears to be doing well with regard to the wounds on her lower extremity. She has been tolerating the dressing changes without complication. Fortunately there does not appear to be any signs of active infection at this time which is great news. No fevers, chills, nausea, vomiting, or diarrhea. 03-15-2022 upon evaluation today patient appears to be doing well currently in regard to her wounds. She has been tolerating the dressing changes without complication. Fortunately there does not appear to be any evidence of active infection locally nor systemically at this time which is great news. I do believe she is getting very close to resolution. 03-22-2022 upon evaluation today patient actually appears to be doing awesome in fact she is has 1 very  tiny area immediately left open the rest of this appears to be almost completely closed. I do not see any evidence of infection locally or systemically which is great news and overall I am extremely pleased with where things stand at this point. I do think we are getting very close to complete resolution. 03-29-2022 upon evaluation today patient appears to be doing well currently in regard to her wound. In fact this appears to be almost completely healed. I think that if she still continues to have no significant drainage come next week that we will probably get a go ahead and heal her out at that point. 04-05-2022 upon evaluation today patient appears to be doing well with regard to her wounds which pretty much looking to be completely closed. With that being said she does have a very tiny area still medial that appears to be leaking a little bit her  legs really swollen as she had a wrap slipped down over the nighttime and this subsequently has been off all day meaning her leg is much more swollen than it was last week. Subsequently I am going to go and see what I can do about clearing away some of the necrotic debris here. Electronic Signature(s) LUA, FENG ([id number]) Signed: 04/05/2022 2:59:55 PM By: Worthy Keeler PA-C Entered By: Worthy Keeler on 04/05/2022 14:59:55 CELINDA, DETHLEFS ([id number]) -------------------------------------------------------------------------------- Physical Exam Details Patient Name: ANNESSA, SATRE. Date of Service: 04/05/2022 2:30 PM Medical Record Number: [id number] Patient Account Number: [id number] Date of Birth/Sex: 1946/11/03 (76 y.o. F) Treating RN: Cornell Barman Primary Care Provider: [name] Other Clinician: Referring Provider: [name] Treating Provider/Extender: [name] in Treatment: 66 Constitutional [name] and well-hydrated in no acute distress. Respiratory normal breathing without difficulty. Psychiatric this patient is able to make decisions and demonstrates good insight into disease process. Alert and Oriented x 3. pleasant and cooperative. Notes Upon inspection patient's wound bed actually showed signs of good granulation epithelization at this point. Fortunately I do not see any evidence of infection locally or systemically which is great news. No fevers, chills, nausea, vomiting, or diarrhea. Electronic Signature(s) Signed: 04/05/2022 3:00:09 PM By: Worthy Keeler PA-C Entered By: Worthy Keeler on 04/05/2022 15:00:09 MARLISA, [name] ([id number]) -------------------------------------------------------------------------------- Physician Orders Details Patient Name: SHEMIAH, ROSCH. Date of Service: 04/05/2022 2:30 PM Medical Record Number: [id number] Patient Account Number: [id number] Date of Birth/Sex: [birth year] (76 y.o. F) Treating RN:  Levora Dredge Primary Care Provider: [name] Other Clinician: Referring Provider: [name] Treating Provider/Extender: [name] in Treatment: 6 Verbal / Phone Orders: No Diagnosis Coding ICD-10 Coding Code Description I89.0 Lymphedema, not elsewhere classified I87.331 Chronic venous hypertension (idiopathic) with ulcer and inflammation of right lower extremity L97.811 Non-pressure chronic ulcer of other part of right lower leg limited to breakdown of skin Follow-up Appointments o Return Appointment in 1 week. o Nurse Visit as needed Bathing/ Shower/ Hygiene o May shower with wound dressing protected with water repellent cover or cast protector. o No tub bath. Anesthetic (Use 'Patient Medications' Section for Anesthetic Order Entry) o Lidocaine applied to wound bed Edema Control - Lymphedema / Segmental Compressive Device / Other o Optional: One layer of unna paste to top of compression wrap (to act as an anchor). - she needs this o Elevate, Exercise Daily and Avoid Standing for Long Periods of Time. o Elevate leg(s) parallel to the floor when sitting. o DO YOUR BEST to sleep in the bed at night. DO NOT  sleep in your recliner. Long hours of sitting in a recliner leads to swelling of the legs and/or potential wounds on your backside. Additional Orders / Instructions o Follow Nutritious Diet and Increase Protein Intake Wound Treatment Wound #16 - Lower Leg Wound Laterality: Right, Medial Cleanser: Soap and Water 1 x Per Week/15 Days Discharge Instructions: Gently cleanse wound with antibacterial soap, rinse and pat dry prior to dressing wounds Cleanser: Wound Cleanser 1 x Per Week/15 Days Discharge Instructions: Wash your hands with soap and water. Remove old dressing, discard into plastic bag and place into trash. Cleanse the wound with Wound Cleanser prior to applying a clean dressing using gauze sponges, not tissues or cotton balls.  Do not scrub or use excessive force. Pat dry using gauze sponges, not tissue or cotton balls. Primary Dressing: Xeroform-HBD 2x2 (in/in) 1 x Per Week/15 Days Discharge Instructions: Apply Xeroform-HBD 2x2 (in/in) as directed Secondary Dressing: Gauze 1 x Per Week/15 Days Compression Wrap: 3-LAYER WRAP - Profore Lite LF 3 Multilayer Compression Bandaging System 1 x Per Week/15 Days Discharge Instructions: Apply 3 multi-layer wrap as prescribed. Electronic Signature(s) Signed: 04/05/2022 3:50:43 PM By: Levora Dredge Signed: 04/05/2022 4:22:20 PM By: Worthy Keeler PA-C Entered By: Levora Dredge on 04/05/2022 15:07:34 Stigall, [name] ([id number]) -------------------------------------------------------------------------------- Problem List Details Patient Name: BEATRICE, ZIEHM. Date of Service: 04/05/2022 2:30 PM Medical Record Number: [id number] Patient Account Number: [id number] Date of Birth/Sex: November 10, 1945 (76 y.o. F) Treating RN: Cornell Barman Primary Care Provider: [name] Other Clinician: Referring Provider: [name] Treating Provider/Extender: [name] in Treatment: 18 Active Problems ICD-10 Encounter Code Description Active Date MDM Diagnosis I89.0 Lymphedema, not elsewhere classified 11/27/2021 No Yes I87.331 Chronic venous hypertension (idiopathic) with ulcer and inflammation of 11/27/2021 No Yes right lower extremity L97.811 Non-pressure chronic ulcer of other part of right lower leg limited to 11/27/2021 No Yes breakdown of skin Inactive Problems Resolved Problems Electronic Signature(s) Signed: 04/05/2022 2:49:34 PM By: Worthy Keeler PA-C Entered By: Worthy Keeler on 04/05/2022 14:49:34 Redcay, [name] ([id number]) -------------------------------------------------------------------------------- Progress Note Details Patient Name: TERE, MCCONAUGHEY. Date of Service: 04/05/2022 2:30 PM Medical Record Number: [id number] Patient Account Number:  [id number] Date of Birth/Sex: 05/07/1946 (76 y.o. F) Treating RN: Cornell Barman Primary Care Provider: [name] Other Clinician: Referring Provider: [name] Treating Provider/Extender: [name] in Treatment: 18 Subjective Chief Complaint Information obtained from Patient Right LE Ulcer History of Present Illness (HPI) Pleasant 76 year old with history of chronic venous insufficiency. No diabetes or peripheral vascular disease. Left ABI 1.29. Questionable history of left lower extremity DVT. She developed a recurrent ulceration on her left lateral calf in December 2015, which she attributes to poor diet and subsequent lower extremity edema. She underwent endovenous laser ablation of her left greater saphenous vein in 2010. She underwent laser ablation of accessory branch of left GSV in April 2016 by Dr. Kellie Simmering at [facility]. She was previously wearing Unna boots, which she tolerated well. Tolerating 2 layer compression and cadexomer iodine. She returns to clinic for follow-up and is without new complaints. She denies any significant pain at this time. She reports persistent pain with pressure. No claudication or ischemic rest pain. No fever or chills. No drainage. READMISSION 11/13/16; this is a 76 year old woman who is not a diabetic. She is here for a review of a painful area on her left medial lower extremity. I note that she was seen here previously last year for wound I believe to be in the  same area. At that time she had undergone previously a left greater saphenous vein ablation by Dr. Kellie Simmering and she had a ablation of the anterior accessory branch of the left greater saphenous vein in March 2016. Seeing that the wound actually closed over. In reviewing the history with her today the ulcer in this area has been recurrent. She describes a biopsy of this area in 2009 that only showed stasis physiology. She also has a history of today malignant melanoma in the right  shoulder for which she follows with Dr. 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 Do not see that she has ever had arterial studies in the left leg. She tells me she has a follow-up with Dr. Kellie Simmering in roughly 10 days In any case she developed the reopening of this area roughly a month ago. On the background of this she describes rapidly increasing edema which has responded to Lasix 40 mg and metolazone 2.5 mg as well as the patient's lymph massage. She has been told she has both venous insufficiency and lymphedema but she cannot tolerate compression stockings 11/28/16; the patient saw Dr. Kellie Simmering recently. Per the patient he did arterial Dopplers in the office that did not show evidence of arterial insufficiency, per the patient he stated "treat this like an ordinary venous ulcer". She also saw her dermatologist Dr. Ronnald Ramp who felt that this was more of a vascular ulcer. In general things are improving although she arrives today with increasing bilateral lower extremity edema with weeping a deeper fluid through the wound on the left medial leg compatible with some degree of lymphedema 12/04/16; the patient's wound is fully epithelialized but I don't think fully healed. We will do another week of depression with Promogran and TCA however I suspect we'll be able to discharge her next week. This is a very unusual-looking wound which was initially a figure-of-eight type wound lying on its side surrounded by petechial like hemorrhage. She has had venous ablation on this side. She apparently does not have an arterial issue per Dr. Kellie Simmering. She saw her dermatologist thought it was "vascular". Patient is definitely going to need ongoing compression and I talked about this with her today she will go to elastic therapy after she leaves here next week 12/11/16; the patient's wound is not completely  closed today. She has surrounding scar tissue and in further discussion with the patient it would appear that she had ulcers in this area in 2009 for a prolonged period of time ultimately requiring a punch biopsy of this area that only showed venous insufficiency. I did not previously pickup on this part of the history from the patient. 12/18/16; the patient's wound is completely epithelialized. There is no open area here. She has significant bilateral venous insufficiency with secondary lymphedema to a mild-to-moderate degree she does not have compression stockings.. She did not say anything to me when I was in the room, she told our intake nurse that she was still having pain in this area. This isn't unusual recurrent small open area. She is going to go to elastic therapy to obtain compression stockings. 12/25/16; the patient's wound is fully epithelialized. There is no open area here. The patient describes some continued episodic discomfort in this area medial left calf. However everything looks fine and healed here. She is been to elastic therapy and caught herself 15-20 mmHg stockings, they apparently  were having trouble getting 20-30 mm stockings in her size 01/22/17; this is a patient we discharged from the clinic a month ago. She has a recurrent open wound on her medial left calf. She had 15 mm support stockings. I told her I thought she needed 20-30 mm compression stockings. She tells me that she has been ill with hospitalization secondary to asthma and is been found to have severe hypokalemia likely secondary to a combination of Lasix and metolazone. This morning she noted blistering and leaking fluid on the posterior part of her left leg. She called our intake nurse urgently and we was saw her this afternoon. She has not had any real discomfort here. I don't know that she's been wearing any stockings on this leg for at least 2-3 days. ABIs in this clinic were 1.21 on the right and 1.3 on the  left. She is previously seen vascular surgery who does not think that there is a peripheral arterial issue. 01/30/17; Patient arrives with no open wound on the left leg. She has been to elastic therapy and obtained 20-63mhg below knee stockings and she has one on the right leg today. READMISSION 02/19/18; this PWessneris a now 76year old patient we've had in this clinic perhaps 3 times before. I had last looked at her from January 07 December 2016 with an area on the medial left leg. We discharged her on 12/25/16 however she had to be readmitted on 01/22/17 with a recurrence. I have in my notes that we discharged her on 20-30 mm stockings although she tells me she was only wearing support hose because she cannot get stockings on predominantly related to her cervical spine surgery/issues. [place] the left with an anterior accessory branch ablation I think both of these were in 2016. On one of the previous visit she had a biopsy noted 2009 that was negative. She is not felt to have an arterial issue. She is not a diabetic. She does have a history of obstructive sleep apnea hypertension asthma as well as chronic venous insufficiency and lymphedema. PKYNZLEIGH, BANDEL([id number] On this occasion she noted 2 dry scaly patch on her left leg. She tried to put lotion on this it didn't really help. There were 2 open areas.the patient has been seeing her primary physician from 02/05/18 through 02/14/18. She had Unna boots applied. The superior wound now on the lateral left leg has closed but she's had one wound that remains open on the lateral left leg. This is not the same spot as we dealt with in 2018. ABIs in this clinic were 1.3 bilaterally 02/26/18; patient has a small wound on the left lateral calf. Dimensions are down. She has chronic venous insufficiency and lymphedema. 03/05/18; small open area on the  left lateral calf. Dimensions are down. Tightly adherent necrotic debris over the surface of the wound which was difficult to remove. Also the dressing [over collagen] stuck to the wound surface. This was removed with some difficulty as well. Change the primary dressing to Hydrofera Blue ready 03/12/18; small open area on the left lateral calf. Comes in with tightly adherent surface eschar as well as some adherent Hydrofera Blue. 03/19/18; open area on the left lateral calf. Again adherent surface eschar as well as some adherent Hydrofera Blue nonviable subcutaneous tissue. She complained of pain all week even with the reduction from 4-3 layer compression I put on  last week. Also she had an increase in her ankle and calf measurements probably related to the same thing. 03/26/18; open area on the left lateral calf. A very small open area remains here. We used silver alginate starting last week as the Hydrofera Blue seem to stick to the wound bed. In using 4-layer compression 04/02/18; the open area in the left lateral calf at some adherent slough which I removed there is no open area here. We are able to transition her into her own compression stocking. Truthfully I think this is probably his support hose. However this does not maintain skin integrity will be limited. She cannot put over the toe compression stockings on because of neck problems hand problems etc. She is allergic to the lining layer of juxta lites. We might be forced to use extremitease stocking should this fail READMIT 11/24/2018 Patient is now a 76 year old woman who is not a diabetic. She has been in this clinic on at least 3 previous occasions largely with recurrent wounds on her left leg secondary to chronic venous insufficiency with secondary lymphedema. Her situation is complicated by inability to get stockings on and an allergy to neoprene which is apparently a component and at least juxta lites and other stockings. As a result she  really has not been wearing any stockings on her legs. She tells [place] that roughly 2 or 3 weeks ago she started noticing a stinging sensation just above her ankle on the left medial aspect. She has been diagnosed with pseudogout and she wondered whether this was what she was experiencing. She tried to dress this with something she bought at the store however subsequently it pulled skin off and now she has an open wound that is not improving. She has been using Vaseline gauze with a cover bandage. She saw her primary doctor last week who put an Haematologist on her. ABIs in this clinic was 1.03 on the left 2/12; the area is on the left medial ankle. Odd-looking wound with what looks to be surface epithelialization but a multitude of small petechial openings. This clearly not closed yet. We have been using silver alginate under 3 layer compression with TCA 2/19; the wound area did not look quite as good this week. Necrotic debris over the majority of the wound surface which required debridement. She continues to have a multitude of what looked to be small petechial openings. She reminds [place] that she had a biopsy on this initially during her first outbreak in 2015 in [place] dermatology. She expresses concern about this being a possible melanoma. 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 I assured her that this does not look anything like melanoma. Except for the petechial reaction it does look like a venous insufficiency area and she certainly has evidence of this on both sides 2/26; a difficult area on the left medial ankle. The patient clearly has chronic venous hypertension with some degree of lymphedema. The odd thing about the area is the small petechial hemorrhages. I am not really sure how to explain this. This was present last time and this is not a compression injury. We have been using Hydrofera Blue which  I changed to last week 3/4; still using Hydrofera Blue. Aggressive debridement today. She does not have known arterial issues. She has seen Dr. Kellie Simmering at [facility] vein and vascular and and has an ablation on the left. [Anterior accessory branch of the  greater saphenous]. From what I remember they did not feel she had an arterial issue. The patient has had this area biopsied in 2009 at [facility] dermatology and by her recollection they said this was "stasis". She is also follow-up with dermatology locally who thought that this was more of a vascular issue 3/11; using Hydrofera Blue. Aggressive debridement today. She does not have an arterial issue. We are using 3 layer compression although we may need to go to 4. The patient has been in for multiple changes to her wrap since I last saw her a week ago. She says that the area was leaking. I do not have too much more information on what was found 01/19/19 on evaluation today patient was actually being seen for a nurse visit when unfortunately she had the area on her left lateral lower extremity as well as weeping from the right lower extremity that became apparent. Therefore we did end up actually seeing her for a full visit with myself. She is having some pain at this site as well but fortunately nothing too significant at this point. No fevers, chills, nausea, or vomiting noted at this time. 3/18-Patient is back to the clinic with the left leg venous leg ulcer, the ulcer is larger in size, has a surface that is densely adherent with fibrinous tissue, the Hydrofera Blue was used but is densely adherent and there was difficulty in removing it. The right lower extremity was also wrapped for weeping edema. Patient has a new area over the left lateral foot above the malleolus that is small and appears to have no debris with intact surrounding skin. Patient is on increased dose of Lasix also as a means to edema management 3/25; the patient has a nonhealing  venous ulcer on the medial left leg and last week developed a smaller area on the lateral left calf. We have been using Hydrofera Blue with a contact layer. 4/1; no major change in these wounds areas. Left medial and more recently left lateral calf. I tried Iodoflex last week to aid in debridement she did not tolerate this. She stated her pain was terrible all week. She took the top layer of the 4 layer compression off. 4/8; the patient actually looks somewhat better in terms of her more prominent left lateral calf wound. There is some healthy looking tissue here. She is still complaining of a lot of discomfort. 4/15; patient in a lot of pain secondary to sciatica. She is on a prednisone taper prescribed by her primary physician. She has the 2 areas one on the left medial and more recently a smaller area on the left lateral calf. Both of these just above the malleoli 4/22; her back pain is better but she still states she is very uncomfortable and now feels she is intolerant to the [employer]. No real change in the wounds we have been using Sorbact. She has been previously intolerant to Iodoflex. There is not a lot of option about what we can use to debride this wound under compression that she no doubt needs. sHe states Ultram no longer works for her pain 4/29; no major change in the wounds slightly increased depth. Surface on the original medial wound perhaps somewhat improved however the more recent area on the lateral left ankle is 100% covered in very adherent debris we have been using Sorbact. She tolerates 4 layer compression well and her edema control is a lot better. She has not had to come in for a nurse check  5/6; no major change in the condition of the wounds. She did consent to debridement today which was done with some difficulty. Continuing Sorbact. She did not tolerate Iodoflex. She was in for a check of her compression the day after we wrapped her last week this was adjusted  but nothing much was found 5/13; no major change in the condition or area of the wounds. I was able to get a fairly aggressive debridement done on the lateral left leg Ingman, [place] ([id number]) wound. Even using Sorbact under compression. She came back in on Friday to have the wrap changed. She says she felt uncomfortable on the lateral aspect of her ankle. She has a long history of chronic venous insufficiency including previous ablation surgery on this side. 5/20-Patient returns for wounds on left leg with both wounds covered in slough, with the lateral leg wound larger in size, she has been in 3 layer compression and felt more comfortable, she describes pain in ankle, in leg and pins and needles in foot, and is about to try Pamelor for this 6/3; wounds on the left lateral and left medial leg. The area medially which is the most recent of the 2 seems to have had the largest increase in dimensions. We have been using Sorbac to try and debride the surface. She has been to see orthopedics they apparently did a plain x-ray that was indeterminant. Diagnosed her with neuropathy and they have ordered an MRI to determine if there is underlying osteomyelitis. This was not high on my thought list but I suppose it is prudent. We have advised her to make an appointment with vein and vascular in [place]. She has a history of a left greater saphenous and accessory vein ablations I wonder if there is anything else that can be done from a surgical point of view to help in these difficult refractory wounds. We have previously healed this wound on one occasion but it keeps on reopening [medial side] 6/10; deep tissue culture I did last week I think on the left medial wound showed both moderate E. coli and moderate staph aureus [MSSA]. She is going to require antibiotics and I have chosen Augmentin. We have been using Sorbact and we have made better looking wound surface on both sides but certainly no  improvement in wound area. She was back in last Friday apparently for a dressing changes the wrap was hurting her outer left ankle. She has not managed to get a hold of vein and vascular in [place]. We are going to have to make her that appointment 6/17; patient is tolerating the Augmentin. She had an MRI that I think was ordered by orthopedic surgeon this did not show osteomyelitis or an abscess did suggest cellulitis. We have been using Sorbact to the lateral and medial ankles. We have been trying to arrange a follow-up appointment with vein and vascular in [place] or did her original ablations. We apparently an area sent the request to vein and vascular in [facility] 6/24; patient has completed the Augmentin. We do not yet have a vein and vascular appointment in [place]. I am not sure what the issue is here we have asked her to call tomorrow. We are using Sorbact. Making some improvements and especially the medial wound. Both surfaces however look better medial and lateral. 7/1; the patient has been in contact with vein and vascular in [place] but has not yet received an appointment. Using Sorbact we have gradually improve the wound surface with no improvement  in surface area. She is approved for Apligraf but the wound surface still is not completely viable. She has not had to come in for a dressing change 7/8; the patient has an appointment with vein and vascular on 7/31 which is a Friday afternoon. She is concerned about getting back here for [place] to dress her wounds. I think it is important to have them goal for her venous reflux/history of ablations etc. to see if anything else can be done. She apparently tested positive for 1 of the blood tests with regards to lupus and saw a rheumatologist. He has raised the issue of vasculitis again. I have had this thought in the past however the evidence seems overwhelming that this is a venous reflux etiology. If the rheumatologist tells  me there is clinical and laboratory investigation is positive for lupus I will rethink this. 7/15; the patient's wound surfaces are quite a bit better. The medial area which was her original wound now has no depth although the lateral wound which was the more recent area actually appears larger. Both with viable surfaces which is indeed better. Using Sorbact. I wanted to use Apligraf on her however there is the issue of the vein and vascular appointment on 7/31 at 2:00 in the afternoon which would not allow her to get back to be rewrapped and they would no doubt remove the graft 7/22; the patient's wound surfaces have moderate amount of debris although generally look better. The lateral one is larger with 2 small satellite areas superiorly. We are waiting for her vein and vascular appointment on 7/31. She has been approved for Apligraf which I would like to use after th 7/29; wound surfaces have improved no debridement is required we have been using Sorbact. She sees vein and vascular on Friday with this so question of whether anything can be done to lessen the likelihood of recurrence and/or speed the healing of these areas. She is already had previous ablations. She no doubt has severe venous hypertension 8/5-Patient returns at 1 week, she was in [place] for 3 days by her podiatrist, we have been using so backed to the wound, she has increased pain in both the wounds on the left lower leg especially the more distal one on the lateral aspect 8/12-Patient returns at 1 week and she is agreeable to having debridement in both wounds on her left leg today. We have been using Sorbact, and vascular studies were reviewed at last visit 8/19; the patient arrives with her wounds fairly clean and no debridement is required. We have used Sorbact which is really done a nice job in cleaning up these very difficult wound surfaces. The patient saw Dr. Donzetta Matters of vascular surgery on 7/31. He did not feel that there  was an arterial component. He felt that her treated greater saphenous vein is adequately addressed and that the small saphenous vein did not appear to be involved significantly. She was also noted to have deep venous reflux which is not treatable. Dr. Donzetta Matters mentioned the possibility of a central obstructive component leading to reflux and he offered her central venography. She wanted to discuss this or think about it. I have urged her to go ahead with this. She has had recurrent difficult wounds in these areas which do heal but after months in the clinic. If there is anything that can be done to reduce the likelihood of this I think it is worth it. 9/2 she is still working towards getting follow-up with Dr.  cain to schedule her CT. Things are quite a bit worse venography. I put Apligraf on 2 weeks ago on both wounds on the medial and lateral part of her left lower leg. She arrives in clinic today with 3 superficial additional wounds above the area laterally and one below the wound medially. She describes a lot of discomfort. I think these are probably wrapped injuries. Does not look like she has cellulitis. 07/20/2019 on evaluation today patient appears to be doing somewhat poorly in regard to her lower extremity ulcers. She in fact showed signs of erythema in fact we may even be dealing with an infection at this time. Unfortunately I am unsure if this is just infection or if indeed there may be some allergic reaction that occurred as a result of the Apligraf application. With that being said that would be unusual but nonetheless not impossible in this patient is one who is unfortunately allergic to quite a bit. Currently we have been using the Sorbact which seems to do as well as anything for her. I do think we may want to obtain a culture today to see if there is anything showing up there that may need to be addressed. 9/16; noted that last week the wounds look worse in 1 week follow-up of the  Apligraf. Using Sorbact as of 2 days ago. She arrives with copious amounts of drainage and new skin breakdown on the back of the left calf. The wounds arm more substantial bilaterally. There is a fair amount of swelling in the left calf no overt DVT there is edema present I think in the left greater than right thigh. She is supposed to go on 9/28 for CT venography. The wounds on the medial and lateral calf are worse and she has new skin breakdown posteriorly at least new for me. This is almost developing into a circumferential wound area The Apligraf was taken off last week which I agree with things are not going in the right direction a culture was done we do not have that back yet. She is on Augmentin that she started 2 days ago 9/23; dressing was changed by her nurses on Monday. In general there is no improvement in the wound areas although the area looks less angry than last week. She did get Augmentin for MSSA cultured on the 14th. She still appears to have too much swelling in the left leg even with 3 layer compression 9/30; the patient underwent her procedure on 9/28 by Dr. Donzetta Matters at vascular and vein specialist. She was discovered to have the common iliac vein measuring 12.2 mm but at the level of L4-L5 measured 3 mm. After stenting it measured 10 mm. 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 Rouleaux flow in the common femoral and femoral vein was observed much improved after stenting. We are using silver alginate to the wounds on the medial and lateral ankle on the left. 4 layer compression 10/7; the patient had fluid swelling around her knee and 4 layer compression. At the advice of vein and vascular this was reduced to 3 layer which she is tolerating better. We have been using silver alginate under 3 layer compression since last Friday AMELIA, BURGARD ([id number]) 10/14; arrives with the areas on the left ankle looking a lot better. Inflammation in the area also a lot  better. She came in for a nurse check on 10/9 10/21; continued nice improvement. Slight improvements in surface area of both the medial and lateral wounds  on the left. A lot of the satellite lesions in the weeping erythema around these from stasis dermatitis is resolved. We have been using silver alginate 10/28; general improvement in the entire wound areas although not a lot of change in dimensions the wound certainly looks better. There is a lot less in terms of venous inflammation. Continue silver alginate this week however look towards Hydrofera Blue next week 11/4; very adherent debris on the medial wound left wound is not as bad. We have been using silver alginate. Change to [facility] today 11/11; very adherent debris on both wound areas. She went to vein and vascular last week and follow-up they put in [place] boot on this today. He says the [facility] was adherent. Wound is definitely not as good as last week. Especially on the left there the satellite lesions look more prominent 11/18; absolutely no better. erythema on lateral aspect with tenderness. 09/30/2019 on evaluation today patient appears to actually be doing better. Dr. Dellia Nims did put her on doxycycline last week which I do believe has helped her at this point. Fortunately there is no signs of active infection at this time. No fevers, chills, nausea, vomiting, or diarrhea. I do believe he may want extend the doxycycline for 7 additional days just to ensure everything does completely cleared up the patient is in agreement with that plan. Otherwise she is going require some sharp debridement today 12/2; patient is completing a 2-week course of doxycycline. I gave her this empirically for inflammation as well as infection when I last saw her 2 weeks ago. All of this seems to be better. She is using silver alginate she has the area on the medial aspect of the larger area laterally and the 2 small satellite regions laterally above  the major wound. 12/9; the patient's wound on the left medial and left lateral calf look really quite good. We have been using silver alginate. She saw vein and vascular in follow-up on 10/09/2019. She has had a previous left greater saphenous vein ablation by Dr. Oscar La in 2016. More recently she underwent a left common iliac vein stent by Dr. 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 The swelling is improved and certainly the wounds have improved. The patient shows [place] today area on the right medial calf there is almost no wound but leaking lymphedema. She says she start this started 3 or 4 days ago. She did not traumatize it. It is not painful. She does not wear compression on that side 12/16; the patient continues to do well laterally. Medially still requiring debridement. The area on the right calf did not materialize to anything and is not currently open. We wrapped this last time. She has support stockings for that leg although I am not sure they are going to provide adequate compression 12/23; the lateral wound looks stable. Medially still requiring debridement for tightly adherent fibrinous debris. We've been using silver alginate. Surface area not any different 12/30; neither wound is any better with regards to surface and the area on the left lateral is larger. I been using silver alginate to the left lateral which look quite good last week and Sorbact to the left medial 11/11/2019. Lateral wound area actually looks better and somewhat smaller. Medial still requires a very aggressive debridement today. We have been using Sorbact on both wound areas 1/13; not much better still adherent debris bilaterally. I been using Sorbact. She has severe venous hypertension. Probably some degree of dermal fibrosis distally.  I wonder whether tighter compression might help and I am going to try that today. We also need to work on the bioburden 1/20; using Sorbact. She has severe venous  hypertension status post stent placement for pelvic vein compression. We applied gentamicin last time to see if we could reduce bioburden I had some discussion with her today about the use of pentoxifylline. This is occasionally used in this setting for wounds with refractory venous insufficiency. However this interacts with Plavix. She tells me that she was put on this after stent placement for 3 months. She will call Dr. Claretha Cooper office to discuss 1/27; we are using gentamicin under Sorbact. 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 She has a stent. Wound medially is measuring smaller this week. Laterally measuring slightly larger although she has some satellite lesions superiorly 2/3; gentamicin under Sorbact under 4-layer compression. 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 She has a stent on Plavix. Her wounds are measuring smaller this week. More substantially laterally where there is a satellite lesion superiorly. 2/10; gentamicin under Sorbac. 4-layer compression. Patient communicated with Dr. Donzetta Matters at vein and vascular in [place]. He is okay with the patient coming off Plavix I will therefore start her on pentoxifylline for a 1 month trial. In general her wounds look better today. I had some concerns about swelling in the left thigh however she measures 61.5 on the right and 63 on the mid thigh which does not suggest there is any difficulty. The patient is not describing any pain. 2/17; gentamicin under Sorbac 4-layer compression. She has been on pentoxifylline for 1 week and complains of loose stool. No nausea she is eating and drinking well 2/24; the patient apparently came in 2 days ago for a nurse visit when her wrap fell down. Both areas look a little worse this week macerated medially and satellite lesions laterally. Change to silver alginate today 3/3; wounds are larger today especially medially. She also has more swelling in  her foot lower leg and I even noted some swelling in her posterior thigh which is tender. I wonder about the patency of her stent. Fortuitously she sees Dr. Claretha Cooper group on Friday 3/10; Mrs. Kassab was seen by vein and vascular on 3/5. The patient underwent ultrasound. There was no evidence of thrombosis involving the IVC no evidence of thrombosis involving the right common iliac vein there is no evidence of thrombosis involving the right external iliac vein the left external vein is also patent. The right common iliac vein stent appears patent bilateral common femoral veins are compressible and appear patent. I was concerned about the left common iliac stent however it looks like this is functional. She has some edema in the posterior thigh that was tender she still has that this week. I also note they had trouble finding the pulses in her left foot and booked her for an ABI baseline in 4 weeks. She will follow up in 6 months for repeat IVC duplex. The patient stopped the pentoxifylline because of diarrhea. It does not look like that was being effective in any case. I have advised her to go back on her aspirin 81 mg tablet, vascular it also suggested this 3/17; comes in today with her wound surfaces a lot better. The excoriations from last week considerably better probably secondary to the TCA. We have been using silver alginate 3/24; comes in today with smaller wounds both medially and laterally. Both required debridement. There are 2 small satellite areas  superiorly laterally. She also has a very odd bandlike area in the mid calf almost looking like there was a weakness in the wrap in a localized area. I would write this off as being this however anteriorly she has a small raised ballotable area that is very tender almost reminiscent of an abscess but there was no obvious purulent surface to it. LEASIA, SWANN ([id number]) 02/04/20 upon evaluation today patient appears to be doing fairly well in  regard to her wounds today. Fortunately there is no signs of active infection at this time. No fevers, chills, nausea, vomiting, or diarrhea. She has been tolerating the dressing changes without complication. Fortunately I feel like she is showing signs of improvement although has been sometime since have seen her. Nonetheless the area of concern that Dr. Dellia Nims had last week where she had possibly an area of the wrap that was we can allow the leg to bulge appears to be doing significantly better today there is no signs of anything worsening. 4/7; the patient's wounds on her medial and lateral left leg continue to contract. We have been using a regular alginate. Last week she developed an area on the right medial lower leg which is probably a venous ulcer as well. 4/14; the wounds on her left medial and lateral lower leg continue to contract. Surface eschar. We have been using regular alginate. The area on the right medial lower leg is closed. We have been putting both legs under 4-layer contraction. The patient went back to see vein and vascular she had arterial studies done which were apparently "quite good" per the patient although I have not read their notes I have never felt she had an arterial issue. 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 This is been longstanding and refractory to exercise, leg elevation and longstanding use of compression wraps in our clinic as well as compression stockings on the times we have been able to get these to heal 4/21; we thought she actually might be close this week however she arrives in clinic with a lot of edema in her upper left calf and into her posterior thigh. This is been an intermittent problem here. She says the wrap fell down but it was replaced with a nurse visit on Monday. We are using calcium alginate to the wounds and the wound sizes there not terribly larger than last week but there is a lot more  edema 4/28; again wound edges are smaller on both sides. Her edema is better controlled than last time. She is obtained her compression pumps from [employer] although they have not been to her home to set these up. 5/5; left medial and left lateral both look stable. I am not sure the medial is any smaller. We have been using calcium alginate under 4-layer compression. She had an area on the right medial. This was eschared today. We have been wrapping this as well. She does not tolerate external compression stockings due to a history of various contact allergies. She has her compression pumps however the representative from the company is coming on her to show her how to use these tomorrow 5/19; patient with severe chronic venous insufficiency secondary to central venous disease. She had a stent placed in her left common iliac vein. She has done better since but still difficult to control wounds. She comes in today with nothing open on the right leg. Her areas on the left medial and left lateral are just about closed.  We are using calcium alginate under 4-layer compression. She is using her external compression pumps at home She only has 15-20 support stockings. States she cannot get anything tighter than that on. 03/30/20-Patient returns at 1 week, the wounds on the left leg are both slightly bigger, the last week she was on 3 layer compression which started to slide down. She is starting to use her lymphedema pumps although she stated on 1 day her right ankle started to swell up and she have to stop that day. Unfortunately the open area seem to oscillate between improving to the point of healing and then flaring up all to do with effectiveness of compression or lack of due to the left leg topography not keeping the compression wraps from rolling down 6/2 patient comes in with a 15/20 mmHg stocking on the right leg. She tells me that she developed a lot of swelling in her ankles she  saw orthopedics she was felt to possibly be having a flare of pseudogout versus some other type of arthritis. She was put on steroids for a respiratory issue so that helps with the inflammation. She has not been using the pumps all week. She thinks the left thigh is more swollen than usual and I would agree with that. She has an appointment with Dr. Donzetta Matters 9 days or so from now 6/9; both wounds on the left medial and left lateral are smaller. We have been using calcium alginate under compression. She does not have an open wound on the right leg she is using a stocking and her compression pumps things are going well. She has an appointment with Dr. Donzetta Matters with regards to her stent in the left common iliac vein 6/16; the wounds on the left medial and left lateral ankle continues to contract. The patient saw Dr. Donzetta Matters and I think he seems satisfied. Ordered follow-up venous reflux studies on both sides in September. Cautioned that she may need thigh-high stockings. She has been using calcium alginate under compression on the left and her own stocking on the right leg. She tells [place] there are no open wounds on the right 6/23; left lateral is just about closed. Medial required debridement today. We have been using calcium alginate. Extensive discussion about the compression pumps she is only using these on 25 mmHg states she could not take 40 or 30 when the wrap came out to her home to demonstrate these. He said they should not feel tight 6/30; the left lateral wound has a slight amount of eschar. . The area medially is about the same using Hydrofera Blue. 7/7; left lateral wound still has some eschar. I will remove this next week may be closed. The area medially is very small using Hydrofera Blue with improvement. Unfortunately the stockings fell down. Unfortunately the blisters have developed at the edge of where the wrap fell. When this happened she says her legs hurt she did not use her pumps. We are not  open Monday for her to come in and change the wraps and she had an appointment yesterday. She also tells me that she is going to have an MRI of her back. She is having pain radiating into her left anterior leg she thinks her from an L5 disc. She saw Dr. Ellene Route of neurosurgery 7/14; the area on the left lateral ankle area is closed. Still a small area medially however it looks better as well. We have been using Hydrofera Blue under 4-layer compression 7/21; left lateral ankle is still  closed however her wound on the medial left calf is actually larger. This is probably because Hydrofera Blue got stuck to the wound. She came in for a nurse change on Friday and will do that again this week I was concerned about the amount of swelling that she had last week however she is using her compression pumps twice a day and the swelling seems well controlled 7/28; remaining wound on the left medial lower leg is smaller. We have been using moistened silver collagen under compression she is coming back for a nurse visit. For reasons that were not really clear she was just keeping her legs elevated and not using her compression pumps. I have asked her to use the compression pumps. She does not have any wounds on the right leg 06/15/20-Patient returns at 2 weeks, her LLE edema is worse and she developed a blister wound that is new and has bigger posterior calf wound on right, we are using Prisma with pad, 4 layer compression. she has been on lasix 40 mg daily 8/18; patient arrives today with things a lot worse than I remember from a few weeks ago. She was seen last week. Noted that her edema was worse and that she now had a left lateral wound as well as deteriorating edema in the medial and posterior part of the lower leg. She says she is using his or her external compression pumps once a day although I wonder about the compliance. 8/25; weeping area on the right medial lower leg. This had actually gotten a small  localized area of her compression stocking wet. On the left side there is a large denuded area on the posterior medial lower leg and smaller area on the lateral. This was not the original areas that we dealt with. 9/1 the patient's wound on the left leg include the left lateral and left posterior. Larger superficial wounds weeping. She has very poor edema control. Tender localized edema in the left lower medial ankle/heel probably because of localized wrap issues. She freely admits she is not using the compression pumps. She has been up on her feet a lot. She thinks the hydrofera blue is contributing to the pain she is experiencing.. This is a complaint that I have occasionally heard EMALEE, KNIES ([id number]) 9/8; really not much improvement. The patient is still complaining of a lot of pain particularly when she uses compression pumps. I switched her to silver alginate last time because she found the Hydrofera Blue to be irritating. I don't hear much difference in her description with the silver alginate. 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 She has follow-up with Dr. Donzetta Matters I think for ultrasound next month 9/15; quite a bit of improvement today. We have less edema and more epithelialization in both of her wound areas on the left medial and left lateral calf. These are not the site of her original wounds in this area. She says she has been using her compression pumps for 30 minutes twice a day, there pain issues that never quite understood. Silver alginate as the primary dressing 9/22; continued improvement. Both areas medially and laterally still have a small open area there is some eschar. She continues to complain of left medial ankle pain. Swelling in the leg is in much better condition. We have been using silver alginate 9/29; continued improvement. Both areas medially and laterally in the left calf look as  though they are close some minor surface eschar but I think this is epithelialized. She comes in today saying she has a ruptured disc at L4-L5 cannot bend over to put on her stockings. 10/6; patient comes in today with no open wounds on either leg. However her edema on the left leg in the upper one third of the lower leg is poorly controlled nonpitting. She says that she could not use the pumps for 2 days and then she has been using the last couple of days. It is not clear to me she has been able to get her stocking on. She has back problems. Mrs. Ferrebee has severe chronic venous insufficiency with secondary lymphedema. Her venous insufficiency is partially centrally mediated and that she is now post stent in the left common iliac vein. [facility] Thurner's syndrome/physiology]. She follows up with them on 10/15. She wears 20/30 below-knee stockings. She is supposed to use compression pumps at home although I think her compliance about with this is been less than 100%. I have asked her to use these 3 times a day. Finally I think she has lipodermatosclerosis in the left lower leg with an inverted bottle sign. It is been a major problem controlling the edema in the left leg. The right leg we have had wounds on but not as significant a problem is on the left READMISSION 04/12/2021 Mrs. Ficco is a 76 year old woman we know well in this clinic. She has severe chronic venous insufficiency. 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 I believe she has had bilateral greater saphenous vein ablation in the past as well. She tells me that this wound opened sometime in March. She had a fall and thinks it was initially abrasion. She developed areas she describes as little blisters on the anterior part of her leg and she saw dermatology and was treated for methicillin staph aureus with several rounds of antibiotics. She has been using support stockings on the left leg and says this  is the only thing she can get on. Her compression pump use maybe once a day she says if she did not use one she use the other. She comes in today with incredible swelling in the left leg with a wound on the left posterior calf. She has been using Neosporin to this previously a hydrocolloid. 6/15; patient arrives back for 1 week follow-up.Marland Kitchen Apparently her wrap fell down she did not call us to replace this. He has poor edema control. She only uses her compression pumps once a day 6/29; patient presents for 1 week follow-up. She has tolerated the compression wrap well. We have been using Iodoflex under the wrap. She has no issues or complaints today. 7/6; patient presents for 1 week follow-up. She states that the compression wrap rolled down her leg 4 days ago. She has been trying to keep the area covered but has no dressings at home to use. She denies signs of infection. 7/13; patient presents for 1 week follow-up. She states that her compression wrap rolled down her right leg and she called our office and had it placed a few days ago. She has been tolerating the current wrap well. She states that the Iodoflex is causing a burning sensation. She denies signs of infection. 7/27; patient presents for 1 week follow-up She has tolerated the compression wrap well with Hydrofera Blue underneath. She has now developed a wound to her right lower extremity. She reports having a culture  done To this area by her dermatologist that she reports is negative. She currently denies signs of infection. 8/30; patient presents for 1 week follow-up. On the left side she has tolerated the compression wrap well with Hydrofera Blue underneath. She reports some discomfort to her right lower extremity with the 3 layer compression. She currently denies signs of infection. 06/14/2021 upon evaluation today patient's wound actually appears to be doing decently well based on what I am seeing currently. She actually has 2 areas 1 on  the left distal/posterior lower leg and the other on the right lower leg. Subsequently the measurements are roughly the same may be slightly smaller but in general have not made any trend towards getting worse which is great news. 06/23/2021 upon evaluation today patient appears to be doing pretty well in regard to her wounds. On the right she is having difficulty with sciatic pain and this subsequently has led to her taking the wrap off over the last week her leg is more swollen she is also been on prednisone for 10 days. With that being said I think we may want to just use a compression sock on the left that way she will not have to worry about the compression wrap being in place that she cannot get off. With that being said I do believe as well that the patient is going to need to continue with the wrapping on the left we will also need to do a little bit of sharp debridement today. 8/24; patient presents for 1 week follow-up. She has no issues or complaints today. She denies signs of infection. 8/31; patient presents for 1 week follow-up. She has no issues or complaints today. She has tolerated the compression wrap well on the left lower extremity. She denies signs of infection. 9/7; patient presents for follow-up. She reports that the compression wrap rolled down slightly on her right lower extremity. She reports tenderness to the wound bed. She denies increased warmth or erythema to the surrounding skin. She overall feels well. 9/14; patient presents for follow-up. She has no issues or complaints today. She denies signs of infection. PuraPly is available and patient would like to start this today. 9/21; patient presents for follow-up. She has no issues or complaints today. She tolerated the first skin substitute placement last week under compression. 9/28; patient presents for 1 week follow-up. She has no issues or complaints today. 10/5; patient presents for 1 week follow-up. She reports  taking the wrap off yesterday. She was on her feet more yesterday and developed more swelling to her left lower extremity. She denies pain. Overall she is doing well. MONQUE, HAGGAR ([id number]) 10/12; patient presents for 1 week follow-up. She has no issues or complaints today. 10/19; patient presents for follow-up. She has no issues or complaints today. She tolerated the compression wrap well. She states she has compression stockings at home. Readmission: 11/27/2021 upon evaluation today patient appears to be doing poorly in regard to the wounds on her right leg. She also has an area of erythema in the proximal calf area of the right leg which again I do believe is a issue from the standpoint of it being warm and erythematous to touch and also seems to be somewhat localized I really think this may be more of a cellulitis issue than anything else. Fortunately I do not see any signs of active infection Systemically at this time. 12/05/2021 upon evaluation patient's wounds actually appear to be showing some signs of improvement which  is not too bad and just 1 week's time. Especially since we had to get on the right antibiotic which is only been for the past 4 days. Fortunately I do not see any signs of active infection locally nor systemically at this time that has gotten any worse. Overall I think that again with the antibiotic she is significantly improved and currently she is taking Levaquin. 12/11/2021 upon evaluation today patient appears to be doing well with regard to her leg. Between the antibiotics and the triamcinolone I feel like she is doing significantly better which is great news. Overall I am extremely pleased with where we stand. There does not appear to be any signs of active infection locally or systemically at this time. 12/18/2021 upon evaluation today patient appears to be doing well with regard to her wound. I do feel like this is showing signs of some improvement here. Little by  little this is clearing up quite nicely. I think it is just a slow process but nonetheless 1 that we are seeing evidence of improvement with regard to. 12/25/2021 upon evaluation today patient appears to be doing well with regard to her wounds she is definitely making some good progress here. Fortunately I do not see any signs of active infection locally or systemically at this time which is great news. No fevers, chills, nausea, vomiting, or diarrhea. 01/02/2022 upon evaluation today patient appears to be doing well with regard to her wound. She has been tolerating the dressing changes without complication. Fortunately I do not see any evidence of active infection locally or systemically which is great news and overall I am extremely pleased with where we stand today. 01/08/2022 upon evaluation today patient appears to be doing better in regard to her ankle ulcers. She has been tolerating the dressing changes without complication. Fortunately I do not see any signs of infection and both the ankle and leg area is making great progress. 3/13; patient presents for follow-up. She has no issues or complaints today. She was tolerated the compression wrap well. 01/19/2022 upon evaluation today patient appears to be doing well currently in regard to her right leg which overall I think is doing excellent. She had called in to try to get her in sooner for a dressing change due to an issue where her cat scratched her on the left leg. With that being said it does not appear that this was actually a scratch but rather that it was a puncture over a small varicose vein. She had a tremendous amount of bleeding as evidenced by the amount of blood in her shoes currently. The good news is this is stopped currently the bad news is that she needs to be very careful with regard to her cats later as this could definitely be a significant problem for her. Fortunately I do not see any signs of active infection locally nor  systemically at this point. 02-08-2022 on evaluation today patient actually appears to be doing significantly better. She was in the hospital due to having stepped on attack on her left heel but nonetheless this is actually showing signs of improvement at this point. I do not see any evidence of active infection locally or systemically which is great news and overall I am extremely pleased with where we stand today. Her right leg is showing no signs of infection. She has been on antibiotics while in the hospital and at discharge. 02-15-2022 upon evaluation today patient appears to be doing better in regard to her wounds.  Overall I feel like we are headed in the right direction. With that being said she does have an area of irritation posteriorly that was not there last week again that makes this wound posterior look little bigger but overall things are significantly better. I am very pleased in that regard. 02-23-2022 upon evaluation today patient appears to be doing well with regard to her wound. It is doing a little bit worse only and the fact that it is dry other than that things seem to be making good improvements here. There does not appear to be any signs of active infection at this time which is great news. 4/27; right lateral leg 4-5 small open areas with eschar on top. I used a curette to remove this. She has been using Xeroform under 3 layer compression. 03-08-2022 upon evaluation today patient appears to be doing well with regard to the wounds on her lower extremity. She has been tolerating the dressing changes without complication. Fortunately there does not appear to be any signs of active infection at this time which is great news. No fevers, chills, nausea, vomiting, or diarrhea. 03-15-2022 upon evaluation today patient appears to be doing well currently in regard to her wounds. She has been tolerating the dressing changes without complication. Fortunately there does not appear to be any  evidence of active infection locally nor systemically at this time which is great news. I do believe she is getting very close to resolution. 03-22-2022 upon evaluation today patient actually appears to be doing awesome in fact she is has 1 very tiny area immediately left open the rest of this appears to be almost completely closed. I do not see any evidence of infection locally or systemically which is great news and overall I am extremely pleased with where things stand at this point. I do think we are getting very close to complete resolution. 03-29-2022 upon evaluation today patient appears to be doing well currently in regard to her wound. In fact this appears to be almost completely healed. I think that if she still continues to have no significant drainage come next week that we will probably get a go ahead and heal her out at that point. 04-05-2022 upon evaluation today patient appears to be doing well with regard to her wounds which pretty much looking to be completely closed. With that being said she does have a very tiny area still medial that appears to be leaking a little bit her legs really swollen as she had a wrap [place], [place]. ([id number]) slipped down over the nighttime and this subsequently has been off all day meaning her leg is much more swollen than it was last week. Subsequently I am going to go and see what I can do about clearing away some of the necrotic debris here. Objective Constitutional [name] and well-hydrated in no acute distress. Vitals Time Taken: 2:38 PM, Height: 62 in, Weight: 199 lbs, BMI: 36.4, Temperature: 98.1 F, Pulse: 78 bpm, Respiratory Rate: 18 breaths/min, Blood Pressure: 154/72 mmHg. Respiratory normal breathing without difficulty. Psychiatric this patient is able to make decisions and demonstrates good insight into disease process. Alert and Oriented x 3. pleasant and cooperative. General Notes: Upon inspection patient's wound bed actually  showed signs of good granulation epithelization at this point. Fortunately I do not see any evidence of infection locally or systemically which is great news. No fevers, chills, nausea, vomiting, or diarrhea. Integumentary (Hair, Skin) Wound #16 status is Open. Original cause of wound was Gradually Appeared.  The date acquired was: 10/26/2021. The wound has been in treatment 14 weeks. The wound is located on the Right,Medial Lower Leg. The wound measures 0.1cm length x 0.1cm width x 0.1cm depth; 0.008cm^2 area and 0.001cm^3 volume. There is no tunneling or undermining noted. There is a none present amount of drainage noted. There is no granulation within the wound bed. There is no necrotic tissue within the wound bed. Assessment Active Problems ICD-10 Lymphedema, not elsewhere classified Chronic venous hypertension (idiopathic) with ulcer and inflammation of right lower extremity Non-pressure chronic ulcer of other part of right lower leg limited to breakdown of skin Plan 1. I am going to suggest that we go ahead and continue with the wound care measures as before and the patient is in agreement with plan. This includes the use of the Xeroform gauze to the medial aspect of her ankle hopefully this will help this last little spot to clear up especially with the compression wrap being in place. 2. We will continue with the compression wrapping with a 3 layer compression wrap which is doing a great job. 3. She does have a compression stockings that are being washed right now she can bring them with her at next visit. We will see patient back for reevaluation in 1 week here in the clinic. If anything worsens or changes patient will contact our office for additional recommendations. Electronic Signature(s) Signed: 04/05/2022 3:00:50 PM By: Worthy Keeler PA-C Entered By: Worthy Keeler on 04/05/2022 15:00:50 Corrow, [name]  ([id number]) -------------------------------------------------------------------------------- SuperBill Details Patient Name: TARALYN, FERRAIOLO. Date of Service: 04/05/2022 Medical Record Number: [id number] Patient Account Number: [id number] Date of Birth/Sex: 1946-07-06 (76 y.o. F) Treating RN: Cornell Barman Primary Care Provider: [name] Other Clinician: Referring Provider: [name] Treating Provider/Extender: [name] in Treatment: 18 Diagnosis Coding ICD-10 Codes Code Description I89.0 Lymphedema, not elsewhere classified I87.331 Chronic venous hypertension (idiopathic) with ulcer and inflammation of right lower extremity L97.811 Non-pressure chronic ulcer of other part of right lower leg limited to breakdown of skin Physician Procedures CPT4 Code Description: [id number] - WC PHYS LEVEL 3 - EST PT Modifier: Quantity: 1 CPT4 Code Description: ICD-10 Diagnosis Description I89.0 Lymphedema, not elsewhere classified I87.331 Chronic venous hypertension (idiopathic) with ulcer and inflammation of rig L97.811 Non-pressure chronic ulcer of other part of right lower leg limited  to brea Modifier: ht lower extremit kdown of skin Quantity: y Engineer, maintenance) Signed: 04/05/2022 3:10:27 PM By: Worthy Keeler PA-C Entered By: Worthy Keeler on 04/05/2022 15:10:27

## 2022-04-06 ENCOUNTER — Ambulatory Visit: Payer: Medicare Other | Admitting: Pulmonary Disease

## 2022-04-07 ENCOUNTER — Other Ambulatory Visit: Payer: Self-pay | Admitting: Family Medicine

## 2022-04-11 ENCOUNTER — Encounter: Payer: Medicare Other | Admitting: Physician Assistant

## 2022-04-11 DIAGNOSIS — I87331 Chronic venous hypertension (idiopathic) with ulcer and inflammation of right lower extremity: Secondary | ICD-10-CM | POA: Diagnosis not present

## 2022-04-11 DIAGNOSIS — L97811 Non-pressure chronic ulcer of other part of right lower leg limited to breakdown of skin: Secondary | ICD-10-CM | POA: Diagnosis not present

## 2022-04-11 DIAGNOSIS — I890 Lymphedema, not elsewhere classified: Secondary | ICD-10-CM | POA: Diagnosis not present

## 2022-04-11 DIAGNOSIS — G4733 Obstructive sleep apnea (adult) (pediatric): Secondary | ICD-10-CM | POA: Diagnosis not present

## 2022-04-11 DIAGNOSIS — I10 Essential (primary) hypertension: Secondary | ICD-10-CM | POA: Diagnosis not present

## 2022-04-11 DIAGNOSIS — I872 Venous insufficiency (chronic) (peripheral): Secondary | ICD-10-CM | POA: Diagnosis not present

## 2022-04-11 NOTE — Progress Notes (Addendum)
[name], [name] ([id number]) Visit Report for 04/11/2022 Chief Complaint Document Details Patient Name: [name], [name]. Date of Service: 04/11/2022 1:00 PM Medical Record Number: [id number] Patient Account Number: [id number] Date of Birth/Sex: 25-Oct-1946 (76 y.o. F) Treating RN: Levora Dredge Primary Care Provider: [name] Other Clinician: Referring Provider: [name] Treating Provider/Extender: [name] in Treatment: 19 Information Obtained from: Patient Chief Complaint Right LE Ulcer Electronic Signature(s) Signed: 04/11/2022 1:06:02 PM By: Worthy Keeler PA-C Entered By: Worthy Keeler on 04/11/2022 13:06:02 [name], [name] ([id number]) -------------------------------------------------------------------------------- HPI Details Patient Name: [name], [name]. Date of Service: 04/11/2022 1:00 PM Medical Record Number: [id number] Patient Account Number: [id number] Date of Birth/Sex: 1946/05/13 (76 y.o. F) Treating RN: Levora Dredge Primary Care Provider: [name] Other Clinician: Referring Provider: [name] Treating Provider/Extender: [name] in Treatment: 19 History of Present Illness HPI Description: Pleasant 76 year old with history of chronic venous insufficiency. No diabetes or peripheral vascular disease. Left ABI 1.29. Questionable history of left lower extremity DVT. She developed a recurrent ulceration on her left lateral calf in December 2015, which she attributes to poor diet and subsequent lower extremity edema. She underwent endovenous laser ablation of her left greater saphenous vein in 2010. She underwent laser ablation of accessory branch of left GSV in April 2016 by Dr. Kellie Simmering at [facility]. She was previously wearing Unna boots, which she tolerated well. Tolerating 2 layer compression and cadexomer iodine. She returns to clinic for follow-up and is without new complaints. She denies any significant pain at this  time. She reports persistent pain with pressure. No claudication or ischemic rest pain. No fever or chills. No drainage. READMISSION 11/13/16; this is a 76 year old woman who is not a diabetic. She is here for a review of a painful area on her left medial lower extremity. I note that she was seen here previously last year for wound I believe to be in the same area. At that time she had undergone previously a left greater saphenous vein ablation by Dr. Kellie Simmering and she had a ablation of the anterior accessory branch of the left greater saphenous vein in March 2016. Seeing that the wound actually closed over. In reviewing the history with her today the ulcer in this area has been recurrent. She describes a biopsy of this area in 2009 that only showed stasis physiology. She also has a history of today malignant melanoma in the right shoulder for which she follows with Dr. 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 Do not see that she has ever had arterial studies in the left leg. She tells me she has a follow-up with Dr. Kellie Simmering in roughly 10 days In any case she developed the reopening of this area roughly a month ago. On the background of this she describes rapidly increasing edema which has responded to Lasix 40 mg and metolazone 2.5 mg as well as the patient's lymph massage. She has been told she has both venous insufficiency and lymphedema but she cannot tolerate compression stockings 11/28/16; the patient saw Dr. Kellie Simmering recently. Per the patient he did arterial Dopplers in the office that did not show evidence of arterial insufficiency, per the patient he stated "treat this like an ordinary venous ulcer". She also saw her dermatologist Dr. Ronnald Ramp who felt that this was more of a vascular ulcer. In general things are improving although she  arrives today with increasing bilateral lower  extremity edema with weeping a deeper fluid through the wound on the left medial leg compatible with some degree of lymphedema 12/04/16; the patient's wound is fully epithelialized but I don't think fully healed. We will do another week of depression with Promogran and TCA however I suspect we'll be able to discharge her next week. This is a very unusual-looking wound which was initially a figure-of-eight type wound lying on its side surrounded by petechial like hemorrhage. She has had venous ablation on this side. She apparently does not have an arterial issue per Dr. Kellie Simmering. She saw her dermatologist thought it was "vascular". Patient is definitely going to need ongoing compression and I talked about this with her today she will go to elastic therapy after she leaves here next week 12/11/16; the patient's wound is not completely closed today. She has surrounding scar tissue and in further discussion with the patient it would appear that she had ulcers in this area in 2009 for a prolonged period of time ultimately requiring a punch biopsy of this area that only showed venous insufficiency. I did not previously pickup on this part of the history from the patient. 12/18/16; the patient's wound is completely epithelialized. There is no open area here. She has significant bilateral venous insufficiency with secondary lymphedema to a mild-to-moderate degree she does not have compression stockings.. She did not say anything to me when I was in the room, she told our intake nurse that she was still having pain in this area. This isn't unusual recurrent small open area. She is going to go to elastic therapy to obtain compression stockings. 12/25/16; the patient's wound is fully epithelialized. There is no open area here. The patient describes some continued episodic discomfort in this area medial left calf. However everything looks fine and healed here. She is been to elastic therapy and caught herself 15-20 mmHg  stockings, they apparently were having trouble getting 20-30 mm stockings in her size 01/22/17; this is a patient we discharged from the clinic a month ago. She has a recurrent open wound on her medial left calf. She had 15 mm support stockings. I told her I thought she needed 20-30 mm compression stockings. She tells me that she has been ill with hospitalization secondary to asthma and is been found to have severe hypokalemia likely secondary to a combination of Lasix and metolazone. This morning she noted blistering and leaking fluid on the posterior part of her left leg. She called our intake nurse urgently and we was saw her this afternoon. She has not had any real discomfort here. I don't know that she's been wearing any stockings on this leg for at least 2-3 days. ABIs in this clinic were 1.21 on the right and 1.3 on the left. She is previously seen vascular surgery who does not think that there is a peripheral arterial issue. 01/30/17; Patient arrives with no open wound on the left leg. She has been to elastic therapy and obtained 20-81mhg below knee stockings and she has one on the right leg today. READMISSION 02/19/18; this PCdebacais a now 76year old patient we've had in this clinic perhaps 3 times before. I had last looked at her from January 07 December 2016 with an area on the medial left leg. We discharged her on 12/25/16 however she had to be readmitted on 01/22/17 with a recurrence. I have in my notes that we discharged her on 20-30 mm stockings although she tells me  she was only wearing support hose because she cannot get stockings on predominantly related to her cervical spine surgery/issues. She has had previous ablations done by vein and vascular in [place] including a great saphenous vein ablation on the left with an anterior accessory branch ablation I think both of these were in 2016. On one of the previous visit she had a biopsy noted 2009 that was negative. She is not felt to  have an arterial issue. She is not a diabetic. She does have a history of obstructive sleep apnea hypertension asthma as well as chronic venous insufficiency and lymphedema. On this occasion she noted 2 dry scaly patch on her left leg. She tried to put lotion on this it didn't really help. There were 2 open areas.the patient has been seeing her primary physician from 02/05/18 through 02/14/18. She had Unna boots applied. The superior wound now on the lateral left leg has closed but she's had one wound that remains open on the lateral left leg. This is not the same spot as we dealt with in 2018. ABIs in this clinic were 1.3 bilaterally [name], [name] ([id number]) 02/26/18; patient has a small wound on the left lateral calf. Dimensions are down. She has chronic venous insufficiency and lymphedema. 03/05/18; small open area on the left lateral calf. Dimensions are down. Tightly adherent necrotic debris over the surface of the wound which was difficult to remove. Also the dressing [over collagen] stuck to the wound surface. This was removed with some difficulty as well. Change the primary dressing to Hydrofera Blue ready 03/12/18; small open area on the left lateral calf. Comes in with tightly adherent surface eschar as well as some adherent Hydrofera Blue. 03/19/18; open area on the left lateral calf. Again adherent surface eschar as well as some adherent Hydrofera Blue nonviable subcutaneous tissue. She complained of pain all week even with the reduction from 4-3 layer compression I put on last week. Also she had an increase in her ankle and calf measurements probably related to the same thing. 03/26/18; open area on the left lateral calf. A very small open area remains here. We used silver alginate starting last week as the Hydrofera Blue seem to stick to the wound bed. In using 4-layer compression 04/02/18; the open area in the left lateral calf at some adherent slough which I removed there is no open area  here. We are able to transition her into her own compression stocking. Truthfully I think this is probably his support hose. However this does not maintain skin integrity will be limited. She cannot put over the toe compression stockings on because of neck problems hand problems etc. She is allergic to the lining layer of juxta lites. We might be forced to use extremitease stocking should this fail READMIT 11/24/2018 Patient is now a 76 year old woman who is not a diabetic. She has been in this clinic on at least 3 previous occasions largely with recurrent wounds on her left leg secondary to chronic venous insufficiency with secondary lymphedema. Her situation is complicated by inability to get stockings on and an allergy to neoprene which is apparently a component and at least juxta lites and other stockings. As a result she really has not been wearing any stockings on her legs. She tells [place] that roughly 2 or 3 weeks ago she started noticing a stinging sensation just above her ankle on the left medial aspect. She has been diagnosed with pseudogout and she wondered whether this was what she was  experiencing. She tried to dress this with something she bought at the store however subsequently it pulled skin off and now she has an open wound that is not improving. She has been using Vaseline gauze with a cover bandage. She saw her primary doctor last week who put an Haematologist on her. ABIs in this clinic was 1.03 on the left 2/12; the area is on the left medial ankle. Odd-looking wound with what looks to be surface epithelialization but a multitude of small petechial openings. This clearly not closed yet. We have been using silver alginate under 3 layer compression with TCA 2/19; the wound area did not look quite as good this week. Necrotic debris over the majority of the wound surface which required debridement. She continues to have a multitude of what looked to be small petechial openings. She  reminds [place] that she had a biopsy on this initially during her first outbreak in 2015 in [place] dermatology. She expresses concern about this being a possible melanoma. She apparently had a nodular melanoma up on her shoulder that was treated with excision, lymph node removal and ultimately radiation. I assured her that this does not look anything like melanoma. Except for the petechial reaction it does look like a venous insufficiency area and she certainly has evidence of this on both sides 2/26; a difficult area on the left medial ankle. The patient clearly has chronic venous hypertension with some degree of lymphedema. The odd thing about the area is the small petechial hemorrhages. I am not really sure how to explain this. This was present last time and this is not a compression injury. We have been using Hydrofera Blue which I changed to last week 3/4; still using Hydrofera Blue. Aggressive debridement today. She does not have known arterial issues. She has seen Dr. Kellie Simmering at [facility] vein and vascular and and has an ablation on the left. [Anterior accessory branch of the greater saphenous]. From what I remember they did not feel she had an arterial issue. The patient has had this area biopsied in 2009 at [facility] dermatology and by her recollection they said this was "stasis". She is also follow-up with dermatology locally who thought that this was more of a vascular issue 3/11; using Hydrofera Blue. Aggressive debridement today. She does not have an arterial issue. We are using 3 layer compression although we may need to go to 4. The patient has been in for multiple changes to her wrap since I last saw her a week ago. She says that the area was leaking. I do not have too much more information on what was found 01/19/19 on evaluation today patient was actually being seen for a nurse visit when unfortunately she had the area on her left lateral lower extremity as well as weeping from the  right lower extremity that became apparent. Therefore we did end up actually seeing her for a full visit with myself. She is having some pain at this site as well but fortunately nothing too significant at this point. No fevers, chills, nausea, or vomiting noted at this time. 3/18-Patient is back to the clinic with the left leg venous leg ulcer, the ulcer is larger in size, has a surface that is densely adherent with fibrinous tissue, the Hydrofera Blue was used but is densely adherent and there was difficulty in removing it. The right lower extremity was also wrapped for weeping edema. Patient has a new area over the left lateral foot above the malleolus that is  small and appears to have no debris with intact surrounding skin. Patient is on increased dose of Lasix also as a means to edema management 3/25; the patient has a nonhealing venous ulcer on the medial left leg and last week developed a smaller area on the lateral left calf. We have been using Hydrofera Blue with a contact layer. 4/1; no major change in these wounds areas. Left medial and more recently left lateral calf. I tried Iodoflex last week to aid in debridement she did not tolerate this. She stated her pain was terrible all week. She took the top layer of the 4 layer compression off. 4/8; the patient actually looks somewhat better in terms of her more prominent left lateral calf wound. There is some healthy looking tissue here. She is still complaining of a lot of discomfort. 4/15; patient in a lot of pain secondary to sciatica. She is on a prednisone taper prescribed by her primary physician. She has the 2 areas one on the left medial and more recently a smaller area on the left lateral calf. Both of these just above the malleoli 4/22; her back pain is better but she still states she is very uncomfortable and now feels she is intolerant to the [employer]. No real change in the wounds we have been using Sorbact. She has been  previously intolerant to Iodoflex. There is not a lot of option about what we can use to debride this wound under compression that she no doubt needs. sHe states Ultram no longer works for her pain 4/29; no major change in the wounds slightly increased depth. Surface on the original medial wound perhaps somewhat improved however the more recent area on the lateral left ankle is 100% covered in very adherent debris we have been using Sorbact. She tolerates 4 layer compression well and her edema control is a lot better. She has not had to come in for a nurse check 5/6; no major change in the condition of the wounds. She did consent to debridement today which was done with some difficulty. Continuing Sorbact. She did not tolerate Iodoflex. She was in for a check of her compression the day after we wrapped her last week this was adjusted but nothing much was found 5/13; no major change in the condition or area of the wounds. I was able to get a fairly aggressive debridement done on the lateral left leg wound. Even using Sorbact under compression. She came back in on Friday to have the wrap changed. She says she felt uncomfortable on the lateral aspect of her ankle. She has a long history of chronic venous insufficiency including previous ablation surgery on this side. 5/20-Patient returns for wounds on left leg with both wounds covered in slough, with the lateral leg wound larger in size, she has been in 3 layer compression and felt more comfortable, she describes pain in ankle, in leg and pins and needles in foot, and is about to try Pamelor for this 6/3; wounds on the left lateral and left medial leg. The area medially which is the most recent of the 2 seems to have had the largest increase in [place], [name]. ([id number]) dimensions. We have been using Sorbac to try and debride the surface. She has been to see orthopedics they apparently did a plain x-ray that was indeterminant. Diagnosed her with  neuropathy and they have ordered an MRI to determine if there is underlying osteomyelitis. This was not high on my thought list but I suppose  it is prudent. We have advised her to make an appointment with vein and vascular in [place]. She has a history of a left greater saphenous and accessory vein ablations I wonder if there is anything else that can be done from a surgical point of view to help in these difficult refractory wounds. We have previously healed this wound on one occasion but it keeps on reopening [medial side] 6/10; deep tissue culture I did last week I think on the left medial wound showed both moderate E. coli and moderate staph aureus [MSSA]. She is going to require antibiotics and I have chosen Augmentin. We have been using Sorbact and we have made better looking wound surface on both sides but certainly no improvement in wound area. She was back in last Friday apparently for a dressing changes the wrap was hurting her outer left ankle. She has not managed to get a hold of vein and vascular in [place]. We are going to have to make her that appointment 6/17; patient is tolerating the Augmentin. She had an MRI that I think was ordered by orthopedic surgeon this did not show osteomyelitis or an abscess did suggest cellulitis. We have been using Sorbact to the lateral and medial ankles. We have been trying to arrange a follow-up appointment with vein and vascular in [place] or did her original ablations. We apparently an area sent the request to vein and vascular in [facility] 6/24; patient has completed the Augmentin. We do not yet have a vein and vascular appointment in [place]. I am not sure what the issue is here we have asked her to call tomorrow. We are using Sorbact. Making some improvements and especially the medial wound. Both surfaces however look better medial and lateral. 7/1; the patient has been in contact with vein and vascular in [place] but has not yet  received an appointment. Using Sorbact we have gradually improve the wound surface with no improvement in surface area. She is approved for Apligraf but the wound surface still is not completely viable. She has not had to come in for a dressing change 7/8; the patient has an appointment with vein and vascular on 7/31 which is a Friday afternoon. She is concerned about getting back here for [place] to dress her wounds. I think it is important to have them goal for her venous reflux/history of ablations etc. to see if anything else can be done. She apparently tested positive for 1 of the blood tests with regards to lupus and saw a rheumatologist. He has raised the issue of vasculitis again. I have had this thought in the past however the evidence seems overwhelming that this is a venous reflux etiology. If the rheumatologist tells me there is clinical and laboratory investigation is positive for lupus I will rethink this. 7/15; the patient's wound surfaces are quite a bit better. The medial area which was her original wound now has no depth although the lateral wound which was the more recent area actually appears larger. Both with viable surfaces which is indeed better. Using Sorbact. I wanted to use Apligraf on her however there is the issue of the vein and vascular appointment on 7/31 at 2:00 in the afternoon which would not allow her to get back to be rewrapped and they would no doubt remove the graft 7/22; the patient's wound surfaces have moderate amount of debris although generally look better. The lateral one is larger with 2 small satellite areas superiorly. We are waiting for her vein and  vascular appointment on 7/31. She has been approved for Apligraf which I would like to use after th 7/29; wound surfaces have improved no debridement is required we have been using Sorbact. She sees vein and vascular on Friday with this so question of whether anything can be done to lessen the likelihood of  recurrence and/or speed the healing of these areas. She is already had previous ablations. She no doubt has severe venous hypertension 8/5-Patient returns at 1 week, she was in [place] for 3 days by her podiatrist, we have been using so backed to the wound, she has increased pain in both the wounds on the left lower leg especially the more distal one on the lateral aspect 8/12-Patient returns at 1 week and she is agreeable to having debridement in both wounds on her left leg today. We have been using Sorbact, and vascular studies were reviewed at last visit 8/19; the patient arrives with her wounds fairly clean and no debridement is required. We have used Sorbact which is really done a nice job in cleaning up these very difficult wound surfaces. The patient saw Dr. Donzetta Matters of vascular surgery on 7/31. He did not feel that there was an arterial component. He felt that her treated greater saphenous vein is adequately addressed and that the small saphenous vein did not appear to be involved significantly. She was also noted to have deep venous reflux which is not treatable. Dr. Donzetta Matters mentioned the possibility of a central obstructive component leading to reflux and he offered her central venography. She wanted to discuss this or think about it. I have urged her to go ahead with this. She has had recurrent difficult wounds in these areas which do heal but after months in the clinic. If there is anything that can be done to reduce the likelihood of this I think it is worth it. 9/2 she is still working towards getting follow-up with Dr. Donzetta Matters to schedule her CT. Things are quite a bit worse venography. I put Apligraf on 2 weeks ago on both wounds on the medial and lateral part of her left lower leg. She arrives in clinic today with 3 superficial additional wounds above the area laterally and one below the wound medially. She describes a lot of discomfort. I think these are probably wrapped injuries. Does not  look like she has cellulitis. 07/20/2019 on evaluation today patient appears to be doing somewhat poorly in regard to her lower extremity ulcers. She in fact showed signs of erythema in fact we may even be dealing with an infection at this time. Unfortunately I am unsure if this is just infection or if indeed there may be some allergic reaction that occurred as a result of the Apligraf application. With that being said that would be unusual but nonetheless not impossible in this patient is one who is unfortunately allergic to quite a bit. Currently we have been using the Sorbact which seems to do as well as anything for her. I do think we may want to obtain a culture today to see if there is anything showing up there that may need to be addressed. 9/16; noted that last week the wounds look worse in 1 week follow-up of the Apligraf. Using Sorbact as of 2 days ago. She arrives with copious amounts of drainage and new skin breakdown on the back of the left calf. The wounds arm more substantial bilaterally. There is a fair amount of swelling in the left calf no overt DVT there  is edema present I think in the left greater than right thigh. She is supposed to go on 9/28 for CT venography. The wounds on the medial and lateral calf are worse and she has new skin breakdown posteriorly at least new for me. This is almost developing into a circumferential wound area The Apligraf was taken off last week which I agree with things are not going in the right direction a culture was done we do not have that back yet. She is on Augmentin that she started 2 days ago 9/23; dressing was changed by her nurses on Monday. In general there is no improvement in the wound areas although the area looks less angry than last week. She did get Augmentin for MSSA cultured on the 14th. She still appears to have too much swelling in the left leg even with 3 layer compression 9/30; the patient underwent her procedure on 9/28 by Dr.  Donzetta Matters at [facility]. She was discovered to have the common iliac vein measuring 12.2 mm but at the level of L4-L5 measured 3 mm. After stenting it measured 10 mm. 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 Rouleaux flow in the common femoral and femoral vein was observed much improved after stenting. We are using silver alginate to the wounds on the medial and lateral ankle on the left. 4 layer compression 10/7; the patient had fluid swelling around her knee and 4 layer compression. At the advice of vein and vascular this was reduced to 3 layer which she is tolerating better. We have been using silver alginate under 3 layer compression since last Friday 10/14; arrives with the areas on the left ankle looking a lot better. Inflammation in the area also a lot better. She came in for a nurse check on 10/9 10/21; continued nice improvement. Slight improvements in surface area of both the medial and lateral wounds on the left. A lot of the satellite lesions in the weeping erythema around these from stasis dermatitis is resolved. We have been using silver alginate ROTHA, [name] ([id number]) 10/28; general improvement in the entire wound areas although not a lot of change in dimensions the wound certainly looks better. There is a lot less in terms of venous inflammation. Continue silver alginate this week however look towards Hydrofera Blue next week 11/4; very adherent debris on the medial wound left wound is not as bad. We have been using silver alginate. Change to [facility] today 11/11; very adherent debris on both wound areas. She went to vein and vascular last week and follow-up they put in [facility] boot on this today. He says the [facility] was adherent. Wound is definitely not as good as last week. Especially on the left there the satellite lesions look more prominent 11/18; absolutely no better. erythema on lateral aspect with tenderness. 09/30/2019 on  evaluation today patient appears to actually be doing better. Dr. Dellia Nims did put her on doxycycline last week which I do believe has helped her at this point. Fortunately there is no signs of active infection at this time. No fevers, chills, nausea, vomiting, or diarrhea. I do believe he may want extend the doxycycline for 7 additional days just to ensure everything does completely cleared up the patient is in agreement with that plan. Otherwise she is going require some sharp debridement today 12/2; patient is completing a 2-week course of doxycycline. I gave her this empirically for inflammation as well as infection when I last saw her  2 weeks ago. All of this seems to be better. She is using silver alginate she has the area on the medial aspect of the larger area laterally and the 2 small satellite regions laterally above the major wound. 12/9; the patient's wound on the left medial and left lateral calf look really quite good. We have been using silver alginate. She saw vein and vascular in follow-up on 10/09/2019. She has had a previous left greater saphenous vein ablation by Dr. Oscar La in 2016. More recently she underwent a left common iliac vein stent by Dr. 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 The swelling is improved and certainly the wounds have improved. The patient shows [place] today area on the right medial calf there is almost no wound but leaking lymphedema. She says she start this started 3 or 4 days ago. She did not traumatize it. It is not painful. She does not wear compression on that side 12/16; the patient continues to do well laterally. Medially still requiring debridement. The area on the right calf did not materialize to anything and is not currently open. We wrapped this last time. She has support stockings for that leg although I am not sure they are going to provide adequate compression 12/23; the lateral wound looks stable. Medially still requiring debridement for  tightly adherent fibrinous debris. We've been using silver alginate. Surface area not any different 12/30; neither wound is any better with regards to surface and the area on the left lateral is larger. I been using silver alginate to the left lateral which look quite good last week and Sorbact to the left medial 11/11/2019. Lateral wound area actually looks better and somewhat smaller. Medial still requires a very aggressive debridement today. We have been using Sorbact on both wound areas 1/13; not much better still adherent debris bilaterally. I been using Sorbact. She has severe venous hypertension. Probably some degree of dermal fibrosis distally. I wonder whether tighter compression might help and I am going to try that today. We also need to work on the bioburden 1/20; using Sorbact. She has severe venous hypertension status post stent placement for pelvic vein compression. We applied gentamicin last time to see if we could reduce bioburden I had some discussion with her today about the use of pentoxifylline. This is occasionally used in this setting for wounds with refractory venous insufficiency. However this interacts with Plavix. She tells me that she was put on this after stent placement for 3 months. She will call Dr. Claretha Cooper office to discuss 1/27; we are using gentamicin under Sorbact. 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 She has a stent. Wound medially is measuring smaller this week. Laterally measuring slightly larger although she has some satellite lesions superiorly 2/3; gentamicin under Sorbact under 4-layer compression. 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 She has a stent on Plavix. Her wounds are measuring smaller this week. More substantially laterally where there is a satellite lesion superiorly. 2/10; gentamicin under Sorbac. 4-layer compression. Patient communicated with Dr. Donzetta Matters at vein and vascular in  [place]. He is okay with the patient coming off Plavix I will therefore start her on pentoxifylline for a 1 month trial. In general her wounds look better today. I had some concerns about swelling in the left thigh however she measures 61.5 on the right and 63 on the mid thigh which does not suggest there is any difficulty. The patient is not describing any pain. 2/17; gentamicin under  Sorbac 4-layer compression. She has been on pentoxifylline for 1 week and complains of loose stool. No nausea she is eating and drinking well 2/24; the patient apparently came in 2 days ago for a nurse visit when her wrap fell down. Both areas look a little worse this week macerated medially and satellite lesions laterally. Change to silver alginate today 3/3; wounds are larger today especially medially. She also has more swelling in her foot lower leg and I even noted some swelling in her posterior thigh which is tender. I wonder about the patency of her stent. Fortuitously she sees Dr. Claretha Cooper group on Friday 3/10; Mrs. Zaro was seen by vein and vascular on 3/5. The patient underwent ultrasound. There was no evidence of thrombosis involving the IVC no evidence of thrombosis involving the right common iliac vein there is no evidence of thrombosis involving the right external iliac vein the left external vein is also patent. The right common iliac vein stent appears patent bilateral common femoral veins are compressible and appear patent. I was concerned about the left common iliac stent however it looks like this is functional. She has some edema in the posterior thigh that was tender she still has that this week. I also note they had trouble finding the pulses in her left foot and booked her for an ABI baseline in 4 weeks. She will follow up in 6 months for repeat IVC duplex. The patient stopped the pentoxifylline because of diarrhea. It does not look like that was being effective in any case. I have advised her  to go back on her aspirin 81 mg tablet, vascular it also suggested this 3/17; comes in today with her wound surfaces a lot better. The excoriations from last week considerably better probably secondary to the TCA. We have been using silver alginate 3/24; comes in today with smaller wounds both medially and laterally. Both required debridement. There are 2 small satellite areas superiorly laterally. She also has a very odd bandlike area in the mid calf almost looking like there was a weakness in the wrap in a localized area. I would write this off as being this however anteriorly she has a small raised ballotable area that is very tender almost reminiscent of an abscess but there was no obvious purulent surface to it. 02/04/20 upon evaluation today patient appears to be doing fairly well in regard to her wounds today. Fortunately there is no signs of active infection at this time. No fevers, chills, nausea, vomiting, or diarrhea. She has been tolerating the dressing changes without complication. Fortunately I feel like she is showing signs of improvement although has been sometime since have seen her. Nonetheless the area of concern that Dr. Dellia Nims had last week where she had possibly an area of the wrap that was we can allow the leg to bulge appears to be doing significantly better today there is no signs of anything worsening. [name], [name] ([id number]) 4/7; the patient's wounds on her medial and lateral left leg continue to contract. We have been using a regular alginate. Last week she developed an area on the right medial lower leg which is probably a venous ulcer as well. 4/14; the wounds on her left medial and lateral lower leg continue to contract. Surface eschar. We have been using regular alginate. The area on the right medial lower leg is closed. We have been putting both legs under 4-layer contraction. The patient went back to see vein and vascular she had arterial studies done  which  were apparently "quite good" per the patient although I have not read their notes I have never felt she had an arterial issue. 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 This is been longstanding and refractory to exercise, leg elevation and longstanding use of compression wraps in our clinic as well as compression stockings on the times we have been able to get these to heal 4/21; we thought she actually might be close this week however she arrives in clinic with a lot of edema in her upper left calf and into her posterior thigh. This is been an intermittent problem here. She says the wrap fell down but it was replaced with a nurse visit on Monday. We are using calcium alginate to the wounds and the wound sizes there not terribly larger than last week but there is a lot more edema 4/28; again wound edges are smaller on both sides. Her edema is better controlled than last time. She is obtained her compression pumps from [employer] although they have not been to her home to set these up. 5/5; left medial and left lateral both look stable. I am not sure the medial is any smaller. We have been using calcium alginate under 4-layer compression. oShe had an area on the right medial. This was eschared today. We have been wrapping this as well. She does not tolerate external compression stockings due to a history of various contact allergies. She has her compression pumps however the representative from the company is coming on her to show her how to use these tomorrow 5/19; patient with severe chronic venous insufficiency secondary to central venous disease. She had a stent placed in her left common iliac vein. She has done better since but still difficult to control wounds. She comes in today with nothing open on the right leg. Her areas on the left medial and left lateral are just about closed. We are using calcium alginate under 4-layer compression.  She is using her external compression pumps at home She only has 15-20 support stockings. States she cannot get anything tighter than that on. 03/30/20-Patient returns at 1 week, the wounds on the left leg are both slightly bigger, the last week she was on 3 layer compression which started to slide down. She is starting to use her lymphedema pumps although she stated on 1 day her right ankle started to swell up and she have to stop that day. Unfortunately the open area seem to oscillate between improving to the point of healing and then flaring up all to do with effectiveness of compression or lack of due to the left leg topography not keeping the compression wraps from rolling down 6/2 patient comes in with a 15/20 mmHg stocking on the right leg. She tells me that she developed a lot of swelling in her ankles she saw orthopedics she was felt to possibly be having a flare of pseudogout versus some other type of arthritis. She was put on steroids for a respiratory issue so that helps with the inflammation. She has not been using the pumps all week. She thinks the left thigh is more swollen than usual and I would agree with that. She has an appointment with Dr. Donzetta Matters 9 days or so from now 6/9; both wounds on the left medial and left lateral are smaller. We have been using calcium alginate under compression. She does not have an open wound on the right leg she is using a stocking and her  compression pumps things are going well. She has an appointment with Dr. Donzetta Matters with regards to her stent in the left common iliac vein 6/16; the wounds on the left medial and left lateral ankle continues to contract. The patient saw Dr. Donzetta Matters and I think he seems satisfied. Ordered follow-up venous reflux studies on both sides in September. Cautioned that she may need thigh-high stockings. She has been using calcium alginate under compression on the left and her own stocking on the right leg. She tells [place] there are no open  wounds on the right 6/23; left lateral is just about closed. Medial required debridement today. We have been using calcium alginate. Extensive discussion about the compression pumps she is only using these on 25 mmHg states she could not take 40 or 30 when the wrap came out to her home to demonstrate these. He said they should not feel tight 6/30; the left lateral wound has a slight amount of eschar. . The area medially is about the same using Hydrofera Blue. 7/7; left lateral wound still has some eschar. I will remove this next week may be closed. The area medially is very small using Hydrofera Blue with improvement. Unfortunately the stockings fell down. Unfortunately the blisters have developed at the edge of where the wrap fell. When this happened she says her legs hurt she did not use her pumps. We are not open Monday for her to come in and change the wraps and she had an appointment yesterday. She also tells me that she is going to have an MRI of her back. She is having pain radiating into her left anterior leg she thinks her from an L5 disc. She saw Dr. Ellene Route of neurosurgery 7/14; the area on the left lateral ankle area is closed. Still a small area medially however it looks better as well. We have been using Hydrofera Blue under 4-layer compression 7/21; left lateral ankle is still closed however her wound on the medial left calf is actually larger. This is probably because Hydrofera Blue got stuck to the wound. She came in for a nurse change on Friday and will do that again this week I was concerned about the amount of swelling that she had last week however she is using her compression pumps twice a day and the swelling seems well controlled 7/28; remaining wound on the left medial lower leg is smaller. We have been using moistened silver collagen under compression she is coming back for a nurse visit. For reasons that were not really clear she was just keeping her legs elevated and not  using her compression pumps. I have asked her to use the compression pumps. She does not have any wounds on the right leg 06/15/20-Patient returns at 2 weeks, her LLE edema is worse and she developed a blister wound that is new and has bigger posterior calf wound on right, we are using Prisma with pad, 4 layer compression. she has been on lasix 40 mg daily 8/18; patient arrives today with things a lot worse than I remember from a few weeks ago. She was seen last week. Noted that her edema was worse and that she now had a left lateral wound as well as deteriorating edema in the medial and posterior part of the lower leg. She says she is using his or her external compression pumps once a day although I wonder about the compliance. 8/25; weeping area on the right medial lower leg. This had actually gotten a small localized area  of her compression stocking wet. oOn the left side there is a large denuded area on the posterior medial lower leg and smaller area on the lateral. This was not the original areas that we dealt with. 9/1 the patient's wound on the left leg include the left lateral and left posterior. Larger superficial wounds weeping. She has very poor edema control. Tender localized edema in the left lower medial ankle/heel probably because of localized wrap issues. She freely admits she is not using the compression pumps. She has been up on her feet a lot. She thinks the hydrofera blue is contributing to the pain she is experiencing.. This is a complaint that I have occasionally heard 9/8; really not much improvement. The patient is still complaining of a lot of pain particularly when she uses compression pumps. I switched her to silver alginate last time because she found the Hydrofera Blue to be irritating. I don't hear much difference in her description with the silver alginate. 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 She has follow-up with Dr. Donzetta Matters I think for ultrasound next month [name], [name] ([id number]) 9/15; quite a bit of improvement today. We have less edema and more epithelialization in both of her wound areas on the left medial and left lateral calf. These are not the site of her original wounds in this area. She says she has been using her compression pumps for 30 minutes twice a day, there pain issues that never quite understood. Silver alginate as the primary dressing 9/22; continued improvement. Both areas medially and laterally still have a small open area there is some eschar. She continues to complain of left medial ankle pain. Swelling in the leg is in much better condition. We have been using silver alginate 9/29; continued improvement. Both areas medially and laterally in the left calf look as though they are close some minor surface eschar but I think this is epithelialized. She comes in today saying she has a ruptured disc at L4-L5 cannot bend over to put on her stockings. 10/6; patient comes in today with no open wounds on either leg. However her edema on the left leg in the upper one third of the lower leg is poorly controlled nonpitting. She says that she could not use the pumps for 2 days and then she has been using the last couple of days. It is not clear to me she has been able to get her stocking on. She has back problems. Mrs. Lucia has severe chronic venous insufficiency with secondary lymphedema. Her venous insufficiency is partially centrally mediated and that she is now post stent in the left common iliac vein. [facility] Thurner's syndrome/physiology]. She follows up with them on 10/15. She wears 20/30 below-knee stockings. She is supposed to use compression pumps at home although I think her compliance about with this is been less than 100%. I have asked her to use these 3 times a day. Finally I think she has lipodermatosclerosis in the left lower leg with an inverted  bottle sign. It is been a major problem controlling the edema in the left leg. The right leg we have had wounds on but not as significant a problem is on the left READMISSION 04/12/2021 Mrs. Fennell is a 76 year old woman we know well in this clinic. She has severe chronic venous insufficiency. 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 I believe she has had bilateral greater saphenous vein ablation in the past as well. She tells me that this wound opened sometime in March. She had a fall and thinks it was initially abrasion. She developed areas she describes as little blisters on the anterior part of her leg and she saw dermatology and was treated for methicillin staph aureus with several rounds of antibiotics. She has been using support stockings on the left leg and says this is the only thing she can get on. Her compression pump use maybe once a day she says if she did not use one she use the other. She comes in today with incredible swelling in the left leg with a wound on the left posterior calf. She has been using Neosporin to this previously a hydrocolloid. 6/15; patient arrives back for 1 week follow-up.Marland Kitchen Apparently her wrap fell down she did not call us to replace this. He has poor edema control. She only uses her compression pumps once a day 6/29; patient presents for 1 week follow-up. She has tolerated the compression wrap well. We have been using Iodoflex under the wrap. She has no issues or complaints today. 7/6; patient presents for 1 week follow-up. She states that the compression wrap rolled down her leg 4 days ago. She has been trying to keep the area covered but has no dressings at home to use. She denies signs of infection. 7/13; patient presents for 1 week follow-up. She states that her compression wrap rolled down her right leg and she called our office and had it placed a few days ago. She has been tolerating the current wrap well. She  states that the Iodoflex is causing a burning sensation. She denies signs of infection. 7/27; patient presents for 1 week follow-up She has tolerated the compression wrap well with Hydrofera Blue underneath. She has now developed a wound to her right lower extremity. She reports having a culture done To this area by her dermatologist that she reports is negative. She currently denies signs of infection. 8/30; patient presents for 1 week follow-up. On the left side she has tolerated the compression wrap well with Hydrofera Blue underneath. She reports some discomfort to her right lower extremity with the 3 layer compression. She currently denies signs of infection. 06/14/2021 upon evaluation today patient's wound actually appears to be doing decently well based on what I am seeing currently. She actually has 2 areas 1 on the left distal/posterior lower leg and the other on the right lower leg. Subsequently the measurements are roughly the same may be slightly smaller but in general have not made any trend towards getting worse which is great news. 06/23/2021 upon evaluation today patient appears to be doing pretty well in regard to her wounds. On the right she is having difficulty with sciatic pain and this subsequently has led to her taking the wrap off over the last week her leg is more swollen she is also been on prednisone for 10 days. With that being said I think we may want to just use a compression sock on the left that way she will not have to worry about the compression wrap being in place that she cannot get off. With that being said I do believe as well that the patient is going to need to continue with the wrapping on the left we will also need to do a little bit of sharp debridement today. 8/24; patient presents for 1 week follow-up. She has no issues  or complaints today. She denies signs of infection. 8/31; patient presents for 1 week follow-up. She has no issues or complaints today. She  has tolerated the compression wrap well on the left lower extremity. She denies signs of infection. 9/7; patient presents for follow-up. She reports that the compression wrap rolled down slightly on her right lower extremity. She reports tenderness to the wound bed. She denies increased warmth or erythema to the surrounding skin. She overall feels well. 9/14; patient presents for follow-up. She has no issues or complaints today. She denies signs of infection. PuraPly is available and patient would like to start this today. 9/21; patient presents for follow-up. She has no issues or complaints today. She tolerated the first skin substitute placement last week under compression. 9/28; patient presents for 1 week follow-up. She has no issues or complaints today. 10/5; patient presents for 1 week follow-up. She reports taking the wrap off yesterday. She was on her feet more yesterday and developed more swelling to her left lower extremity. She denies pain. Overall she is doing well. 10/12; patient presents for 1 week follow-up. She has no issues or complaints today. 10/19; patient presents for follow-up. She has no issues or complaints today. She tolerated the compression wrap well. She states she has compression stockings at home. [name], [name] ([id number]) Readmission: 11/27/2021 upon evaluation today patient appears to be doing poorly in regard to the wounds on her right leg. She also has an area of erythema in the proximal calf area of the right leg which again I do believe is a issue from the standpoint of it being warm and erythematous to touch and also seems to be somewhat localized I really think this may be more of a cellulitis issue than anything else. Fortunately I do not see any signs of active infection Systemically at this time. 12/05/2021 upon evaluation patient's wounds actually appear to be showing some signs of improvement which is not too bad and just 1 week's time. Especially  since we had to get on the right antibiotic which is only been for the past 4 days. Fortunately I do not see any signs of active infection locally nor systemically at this time that has gotten any worse. Overall I think that again with the antibiotic she is significantly improved and currently she is taking Levaquin. 12/11/2021 upon evaluation today patient appears to be doing well with regard to her leg. Between the antibiotics and the triamcinolone I feel like she is doing significantly better which is great news. Overall I am extremely pleased with where we stand. There does not appear to be any signs of active infection locally or systemically at this time. 12/18/2021 upon evaluation today patient appears to be doing well with regard to her wound. I do feel like this is showing signs of some improvement here. Little by little this is clearing up quite nicely. I think it is just a slow process but nonetheless 1 that we are seeing evidence of improvement with regard to. 12/25/2021 upon evaluation today patient appears to be doing well with regard to her wounds she is definitely making some good progress here. Fortunately I do not see any signs of active infection locally or systemically at this time which is great news. No fevers, chills, nausea, vomiting, or diarrhea. 01/02/2022 upon evaluation today patient appears to be doing well with regard to her wound. She has been tolerating the dressing changes without complication. Fortunately I do not see any evidence of active infection  locally or systemically which is great news and overall I am extremely pleased with where we stand today. 01/08/2022 upon evaluation today patient appears to be doing better in regard to her ankle ulcers. She has been tolerating the dressing changes without complication. Fortunately I do not see any signs of infection and both the ankle and leg area is making great progress. 3/13; patient presents for follow-up. She has no  issues or complaints today. She was tolerated the compression wrap well. 01/19/2022 upon evaluation today patient appears to be doing well currently in regard to her right leg which overall I think is doing excellent. She had called in to try to get her in sooner for a dressing change due to an issue where her cat scratched her on the left leg. With that being said it does not appear that this was actually a scratch but rather that it was a puncture over a small varicose vein. She had a tremendous amount of bleeding as evidenced by the amount of blood in her shoes currently. The good news is this is stopped currently the bad news is that she needs to be very careful with regard to her cats later as this could definitely be a significant problem for her. Fortunately I do not see any signs of active infection locally nor systemically at this point. 02-08-2022 on evaluation today patient actually appears to be doing significantly better. She was in the hospital due to having stepped on attack on her left heel but nonetheless this is actually showing signs of improvement at this point. I do not see any evidence of active infection locally or systemically which is great news and overall I am extremely pleased with where we stand today. Her right leg is showing no signs of infection. She has been on antibiotics while in the hospital and at discharge. 02-15-2022 upon evaluation today patient appears to be doing better in regard to her wounds. Overall I feel like we are headed in the right direction. With that being said she does have an area of irritation posteriorly that was not there last week again that makes this wound posterior look little bigger but overall things are significantly better. I am very pleased in that regard. 02-23-2022 upon evaluation today patient appears to be doing well with regard to her wound. It is doing a little bit worse only and the fact that it is dry other than that things seem to  be making good improvements here. There does not appear to be any signs of active infection at this time which is great news. 4/27; right lateral leg 4-5 small open areas with eschar on top. I used a curette to remove this. She has been using Xeroform under 3 layer compression. 03-08-2022 upon evaluation today patient appears to be doing well with regard to the wounds on her lower extremity. She has been tolerating the dressing changes without complication. Fortunately there does not appear to be any signs of active infection at this time which is great news. No fevers, chills, nausea, vomiting, or diarrhea. 03-15-2022 upon evaluation today patient appears to be doing well currently in regard to her wounds. She has been tolerating the dressing changes without complication. Fortunately there does not appear to be any evidence of active infection locally nor systemically at this time which is great news. I do believe she is getting very close to resolution. 03-22-2022 upon evaluation today patient actually appears to be doing awesome in fact she is has 1 very  tiny area immediately left open the rest of this appears to be almost completely closed. I do not see any evidence of infection locally or systemically which is great news and overall I am extremely pleased with where things stand at this point. I do think we are getting very close to complete resolution. 03-29-2022 upon evaluation today patient appears to be doing well currently in regard to her wound. In fact this appears to be almost completely healed. I think that if she still continues to have no significant drainage come next week that we will probably get a go ahead and heal her out at that point. 04-05-2022 upon evaluation today patient appears to be doing well with regard to her wounds which pretty much looking to be completely closed. With that being said she does have a very tiny area still medial that appears to be leaking a little bit her  legs really swollen as she had a wrap slipped down over the nighttime and this subsequently has been off all day meaning her leg is much more swollen than it was last week. Subsequently I am going to go and see what I can do about clearing away some of the necrotic debris here. 04-11-2022 upon evaluation today patient's wound actually is really almost nonexistent there is definitely some evidence of drainage but really I do CHRISHAWNA, [name]. ([id number]) not see anything significantly open which is great news. Fortunately I do not believe that she is showing any signs of active infection right now which is great her swelling is significantly down. Electronic Signature(s) Signed: 04/11/2022 2:07:27 PM By: Worthy Keeler PA-C Entered By: Worthy Keeler on 04/11/2022 14:07:27 ABIGIAL, [place] ([id number]) -------------------------------------------------------------------------------- Physical Exam Details Patient Name: DELYNDA, SEPULVEDA. Date of Service: 04/11/2022 1:00 PM Medical Record Number: [id number] Patient Account Number: [id number] Date of Birth/Sex: Jul 01, 1946 (76 y.o. F) Treating RN: Levora Dredge Primary Care Provider: [name] Other Clinician: Referring Provider: [name] Treating Provider/Extender: [name] in Treatment: 22 Constitutional [name] and well-hydrated in no acute distress. Respiratory normal breathing without difficulty. Psychiatric this patient is able to make decisions and demonstrates good insight into disease process. Alert and Oriented x 3. pleasant and cooperative. Notes Upon inspection patient's wound bed showed evidence of good granulation epithelization at this point. Fortunately I do not see any signs of active infection locally or systemically which is great and overall I think she is doing well her swelling is definitely down and well on the right track here. Electronic Signature(s) Signed: 04/11/2022 2:07:45 PM By: Worthy Keeler PA-C Entered By: Worthy Keeler on 04/11/2022 14:07:44 Group, [name] ([id number]) -------------------------------------------------------------------------------- Physician Orders Details Patient Name: WHITLEE, SLUDER. Date of Service: 04/11/2022 1:00 PM Medical Record Number: [id number] Patient Account Number: [id number] Date of Birth/Sex: Sep 01, 1946 (76 y.o. F) Treating RN: Levora Dredge Primary Care Provider: [name] Other Clinician: Referring Provider: [name] Treating Provider/Extender: [name] in Treatment: 61 Verbal / Phone Orders: No Diagnosis Coding ICD-10 Coding Code Description I89.0 Lymphedema, not elsewhere classified I87.331 Chronic venous hypertension (idiopathic) with ulcer and inflammation of right lower extremity L97.811 Non-pressure chronic ulcer of other part of right lower leg limited to breakdown of skin Follow-up Appointments o Return Appointment in 1 week. o Nurse Visit as needed Bathing/ Shower/ Hygiene o May shower with wound dressing protected with water repellent cover or cast protector. o No tub bath. Anesthetic (Use 'Patient Medications' Section for Anesthetic Order Entry) o Lidocaine applied to  wound bed Edema Control - Lymphedema / Segmental Compressive Device / Other o Optional: One layer of unna paste to top of compression wrap (to act as an anchor). - she needs this o Elevate, Exercise Daily and Avoid Standing for Long Periods of Time. o Elevate leg(s) parallel to the floor when sitting. o DO YOUR BEST to sleep in the bed at night. DO NOT sleep in your recliner. Long hours of sitting in a recliner leads to swelling of the legs and/or potential wounds on your backside. Additional Orders / Instructions o Follow Nutritious Diet and Increase Protein Intake Wound Treatment Wound #16 - Lower Leg Wound Laterality: Right, Medial Cleanser: Soap and Water 1 x Per Week/15 Days Discharge  Instructions: Gently cleanse wound with antibacterial soap, rinse and pat dry prior to dressing wounds Cleanser: Wound Cleanser 1 x Per Week/15 Days Discharge Instructions: Wash your hands with soap and water. Remove old dressing, discard into plastic bag and place into trash. Cleanse the wound with Wound Cleanser prior to applying a clean dressing using gauze sponges, not tissues or cotton balls. Do not scrub or use excessive force. Pat dry using gauze sponges, not tissue or cotton balls. Primary Dressing: Xeroform-HBD 2x2 (in/in) 1 x Per Week/15 Days Discharge Instructions: Apply Xeroform-HBD 2x2 (in/in) as directed Secondary Dressing: Gauze 1 x Per Week/15 Days Compression Wrap: 3-LAYER WRAP - Profore Lite LF 3 Multilayer Compression Bandaging System 1 x Per Week/15 Days Discharge Instructions: Apply 3 multi-layer wrap as prescribed. Electronic Signature(s) Signed: 04/11/2022 4:16:45 PM By: Levora Dredge Signed: 04/12/2022 4:11:43 PM By: Worthy Keeler PA-C Entered By: Levora Dredge on 04/11/2022 13:43:53 Vandergrift, [name] ([id number]) -------------------------------------------------------------------------------- Problem List Details Patient Name: MELANYE, HIRALDO. Date of Service: 04/11/2022 1:00 PM Medical Record Number: [id number] Patient Account Number: [id number] Date of Birth/Sex: 03/21/1946 (76 y.o. F) Treating RN: Levora Dredge Primary Care Provider: [name] Other Clinician: Referring Provider: [name] Treating Provider/Extender: [name] in Treatment: 19 Active Problems ICD-10 Encounter Code Description Active Date MDM Diagnosis I89.0 Lymphedema, not elsewhere classified 11/27/2021 No Yes I87.331 Chronic venous hypertension (idiopathic) with ulcer and inflammation of 11/27/2021 No Yes right lower extremity L97.811 Non-pressure chronic ulcer of other part of right lower leg limited to 11/27/2021 No Yes breakdown of skin Inactive Problems Resolved  Problems Electronic Signature(s) Signed: 04/11/2022 1:05:57 PM By: Worthy Keeler PA-C Entered By: Worthy Keeler on 04/11/2022 13:05:57 [name], [name] ([id number]) -------------------------------------------------------------------------------- Progress Note Details Patient Name: CONNY, SITU. Date of Service: 04/11/2022 1:00 PM Medical Record Number: [id number] Patient Account Number: [id number] Date of Birth/Sex: 19-Sep-1946 (76 y.o. F) Treating RN: Levora Dredge Primary Care Provider: [name] Other Clinician: Referring Provider: [name] Treating Provider/Extender: [name] in Treatment: 19 Subjective Chief Complaint Information obtained from Patient Right LE Ulcer History of Present Illness (HPI) Pleasant 76 year old with history of chronic venous insufficiency. No diabetes or peripheral vascular disease. Left ABI 1.29. Questionable history of left lower extremity DVT. She developed a recurrent ulceration on her left lateral calf in December 2015, which she attributes to poor diet and subsequent lower extremity edema. She underwent endovenous laser ablation of her left greater saphenous vein in 2010. She underwent laser ablation of accessory branch of left GSV in April 2016 by Dr. Kellie Simmering at [facility]. She was previously wearing Unna boots, which she tolerated well. Tolerating 2 layer compression and cadexomer iodine. She returns to clinic for follow-up and is without new complaints. She denies any significant pain  at this time. She reports persistent pain with pressure. No claudication or ischemic rest pain. No fever or chills. No drainage. READMISSION 11/13/16; this is a 76 year old woman who is not a diabetic. She is here for a review of a painful area on her left medial lower extremity. I note that she was seen here previously last year for wound I believe to be in the same area. At that time she had undergone previously a left greater saphenous vein  ablation by Dr. Kellie Simmering and she had a ablation of the anterior accessory branch of the left greater saphenous vein in March 2016. Seeing that the wound actually closed over. In reviewing the history with her today the ulcer in this area has been recurrent. She describes a biopsy of this area in 2009 that only showed stasis physiology. She also has a history of today malignant melanoma in the right shoulder for which she follows with Dr. 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 Do not see that she has ever had arterial studies in the left leg. She tells me she has a follow-up with Dr. Kellie Simmering in roughly 10 days In any case she developed the reopening of this area roughly a month ago. On the background of this she describes rapidly increasing edema which has responded to Lasix 40 mg and metolazone 2.5 mg as well as the patient's lymph massage. She has been told she has both venous insufficiency and lymphedema but she cannot tolerate compression stockings 11/28/16; the patient saw Dr. Kellie Simmering recently. Per the patient he did arterial Dopplers in the office that did not show evidence of arterial insufficiency, per the patient he stated "treat this like an ordinary venous ulcer". She also saw her dermatologist Dr. Ronnald Ramp who felt that this was more of a vascular ulcer. In general things are improving although she arrives today with increasing bilateral lower extremity edema with weeping a deeper fluid through the wound on the left medial leg compatible with some degree of lymphedema 12/04/16; the patient's wound is fully epithelialized but I don't think fully healed. We will do another week of depression with Promogran and TCA however I suspect we'll be able to discharge her next week. This is a very unusual-looking wound which was initially a figure-of-eight type wound lying on its side  surrounded by petechial like hemorrhage. She has had venous ablation on this side. She apparently does not have an arterial issue per Dr. Kellie Simmering. She saw her dermatologist thought it was "vascular". Patient is definitely going to need ongoing compression and I talked about this with her today she will go to elastic therapy after she leaves here next week 12/11/16; the patient's wound is not completely closed today. She has surrounding scar tissue and in further discussion with the patient it would appear that she had ulcers in this area in 2009 for a prolonged period of time ultimately requiring a punch biopsy of this area that only showed venous insufficiency. I did not previously pickup on this part of the history from the patient. 12/18/16; the patient's wound is completely epithelialized. There is no open area here. She has significant bilateral venous insufficiency with secondary lymphedema to a mild-to-moderate degree she does not have compression stockings.. She did not say anything to me when I was in the room, she told our intake nurse that she was still having pain in this  area. This isn't unusual recurrent small open area. She is going to go to elastic therapy to obtain compression stockings. 12/25/16; the patient's wound is fully epithelialized. There is no open area here. The patient describes some continued episodic discomfort in this area medial left calf. However everything looks fine and healed here. She is been to elastic therapy and caught herself 15-20 mmHg stockings, they apparently were having trouble getting 20-30 mm stockings in her size 01/22/17; this is a patient we discharged from the clinic a month ago. She has a recurrent open wound on her medial left calf. She had 15 mm support stockings. I told her I thought she needed 20-30 mm compression stockings. She tells me that she has been ill with hospitalization secondary to asthma and is been found to have severe hypokalemia likely  secondary to a combination of Lasix and metolazone. This morning she noted blistering and leaking fluid on the posterior part of her left leg. She called our intake nurse urgently and we was saw her this afternoon. She has not had any real discomfort here. I don't know that she's been wearing any stockings on this leg for at least 2-3 days. ABIs in this clinic were 1.21 on the right and 1.3 on the left. She is previously seen vascular surgery who does not think that there is a peripheral arterial issue. 01/30/17; Patient arrives with no open wound on the left leg. She has been to elastic therapy and obtained 20-66mhg below knee stockings and she has one on the right leg today. READMISSION 02/19/18; this PSeiboldis a now 76year old patient we've had in this clinic perhaps 3 times before. I had last looked at her from January 07 December 2016 with an area on the medial left leg. We discharged her on 12/25/16 however she had to be readmitted on 01/22/17 with a recurrence. I have in my notes that we discharged her on 20-30 mm stockings although she tells me she was only wearing support hose because she cannot get stockings on predominantly related to her cervical spine surgery/issues. She has [place] on the left with an anterior accessory branch ablation I think both of these were in 2016. On one of the previous visit she had a biopsy noted 2009 that was negative. She is not felt to have an arterial issue. She is not a diabetic. She does have a history of obstructive sleep apnea hypertension asthma as well as chronic venous insufficiency and lymphedema. [name], [name]([id number] On this occasion she noted 2 dry scaly patch on her left leg. She tried to put lotion on this it didn't really help. There were 2 open areas.the patient has been seeing her primary physician from 02/05/18 through 02/14/18. She had Unna  boots applied. The superior wound now on the lateral left leg has closed but she's had one wound that remains open on the lateral left leg. This is not the same spot as we dealt with in 2018. ABIs in this clinic were 1.3 bilaterally 02/26/18; patient has a small wound on the left lateral calf. Dimensions are down. She has chronic venous insufficiency and lymphedema. 03/05/18; small open area on the left lateral calf. Dimensions are down. Tightly adherent necrotic debris over the surface of the wound which was difficult to remove. Also the dressing [over collagen] stuck to the wound surface. This was removed with some difficulty as well. Change the primary  dressing to Hydrofera Blue ready 03/12/18; small open area on the left lateral calf. Comes in with tightly adherent surface eschar as well as some adherent Hydrofera Blue. 03/19/18; open area on the left lateral calf. Again adherent surface eschar as well as some adherent Hydrofera Blue nonviable subcutaneous tissue. She complained of pain all week even with the reduction from 4-3 layer compression I put on last week. Also she had an increase in her ankle and calf measurements probably related to the same thing. 03/26/18; open area on the left lateral calf. A very small open area remains here. We used silver alginate starting last week as the Hydrofera Blue seem to stick to the wound bed. In using 4-layer compression 04/02/18; the open area in the left lateral calf at some adherent slough which I removed there is no open area here. We are able to transition her into her own compression stocking. Truthfully I think this is probably his support hose. However this does not maintain skin integrity will be limited. She cannot put over the toe compression stockings on because of neck problems hand problems etc. She is allergic to the lining layer of juxta lites. We might be forced to use extremitease stocking should this fail READMIT 11/24/2018 Patient is now a  76 year old woman who is not a diabetic. She has been in this clinic on at least 3 previous occasions largely with recurrent wounds on her left leg secondary to chronic venous insufficiency with secondary lymphedema. Her situation is complicated by inability to get stockings on and an allergy to neoprene which is apparently a component and at least juxta lites and other stockings. As a result she really has not been wearing any stockings on her legs. She tells [place] that roughly 2 or 3 weeks ago she started noticing a stinging sensation just above her ankle on the left medial aspect. She has been diagnosed with pseudogout and she wondered whether this was what she was experiencing. She tried to dress this with something she bought at the store however subsequently it pulled skin off and now she has an open wound that is not improving. She has been using Vaseline gauze with a cover bandage. She saw her primary doctor last week who put an Haematologist on her. ABIs in this clinic was 1.03 on the left 2/12; the area is on the left medial ankle. Odd-looking wound with what looks to be surface epithelialization but a multitude of small petechial openings. This clearly not closed yet. We have been using silver alginate under 3 layer compression with TCA 2/19; the wound area did not look quite as good this week. Necrotic debris over the majority of the wound surface which required debridement. She continues to have a multitude of what looked to be small petechial openings. She reminds [place] that she had a biopsy on this initially during her first outbreak in 2015 in [facility]. She expresses concern about this being a possible melanoma. 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 I assured her that this does not look anything like melanoma. Except for the petechial reaction it does look like a venous insufficiency area  and she certainly has evidence of this on both sides 2/26; a difficult area on the left medial ankle. The patient clearly has chronic venous hypertension with some degree of lymphedema. The odd thing about the area is the small petechial hemorrhages. I am not really  sure how to explain this. This was present last time and this is not a compression injury. We have been using Hydrofera Blue which I changed to last week 3/4; still using Hydrofera Blue. Aggressive debridement today. She does not have known arterial issues. She has seen Dr. Kellie Simmering at [facility] vein and vascular and and has an ablation on the left. [Anterior accessory branch of the greater saphenous]. From what I remember they did not feel she had an arterial issue. The patient has had this area biopsied in 2009 at [facility] dermatology and by her recollection they said this was "stasis". She is also follow-up with dermatology locally who thought that this was more of a vascular issue 3/11; using Hydrofera Blue. Aggressive debridement today. She does not have an arterial issue. We are using 3 layer compression although we may need to go to 4. The patient has been in for multiple changes to her wrap since I last saw her a week ago. She says that the area was leaking. I do not have too much more information on what was found 01/19/19 on evaluation today patient was actually being seen for a nurse visit when unfortunately she had the area on her left lateral lower extremity as well as weeping from the right lower extremity that became apparent. Therefore we did end up actually seeing her for a full visit with myself. She is having some pain at this site as well but fortunately nothing too significant at this point. No fevers, chills, nausea, or vomiting noted at this time. 3/18-Patient is back to the clinic with the left leg venous leg ulcer, the ulcer is larger in size, has a surface that is densely adherent with fibrinous tissue, the  Hydrofera Blue was used but is densely adherent and there was difficulty in removing it. The right lower extremity was also wrapped for weeping edema. Patient has a new area over the left lateral foot above the malleolus that is small and appears to have no debris with intact surrounding skin. Patient is on increased dose of Lasix also as a means to edema management 3/25; the patient has a nonhealing venous ulcer on the medial left leg and last week developed a smaller area on the lateral left calf. We have been using Hydrofera Blue with a contact layer. 4/1; no major change in these wounds areas. Left medial and more recently left lateral calf. I tried Iodoflex last week to aid in debridement she did not tolerate this. She stated her pain was terrible all week. She took the top layer of the 4 layer compression off. 4/8; the patient actually looks somewhat better in terms of her more prominent left lateral calf wound. There is some healthy looking tissue here. She is still complaining of a lot of discomfort. 4/15; patient in a lot of pain secondary to sciatica. She is on a prednisone taper prescribed by her primary physician. She has the 2 areas one on the left medial and more recently a smaller area on the left lateral calf. Both of these just above the malleoli 4/22; her back pain is better but she still states she is very uncomfortable and now feels she is intolerant to the [employer]. No real change in the wounds we have been using Sorbact. She has been previously intolerant to Iodoflex. There is not a lot of option about what we can use to debride this wound under compression that she no doubt needs. sHe states Ultram no longer works for her  pain 4/29; no major change in the wounds slightly increased depth. Surface on the original medial wound perhaps somewhat improved however the more recent area on the lateral left ankle is 100% covered in very adherent debris we have been using Sorbact. She  tolerates 4 layer compression well and her edema control is a lot better. She has not had to come in for a nurse check 5/6; no major change in the condition of the wounds. She did consent to debridement today which was done with some difficulty. Continuing Sorbact. She did not tolerate Iodoflex. She was in for a check of her compression the day after we wrapped her last week this was adjusted but nothing much was found 5/13; no major change in the condition or area of the wounds. I was able to get a fairly aggressive debridement done on the lateral left leg Teresi, [place] ([id number]) wound. Even using Sorbact under compression. She came back in on Friday to have the wrap changed. She says she felt uncomfortable on the lateral aspect of her ankle. She has a long history of chronic venous insufficiency including previous ablation surgery on this side. 5/20-Patient returns for wounds on left leg with both wounds covered in slough, with the lateral leg wound larger in size, she has been in 3 layer compression and felt more comfortable, she describes pain in ankle, in leg and pins and needles in foot, and is about to try Pamelor for this 6/3; wounds on the left lateral and left medial leg. The area medially which is the most recent of the 2 seems to have had the largest increase in dimensions. We have been using Sorbac to try and debride the surface. She has been to see orthopedics they apparently did a plain x-ray that was indeterminant. Diagnosed her with neuropathy and they have ordered an MRI to determine if there is underlying osteomyelitis. This was not high on my thought list but I suppose it is prudent. We have advised her to make an appointment with vein and vascular in [place]. She has a history of a left greater saphenous and accessory vein ablations I wonder if there is anything else that can be done from a surgical point of view to help in these difficult refractory wounds. We have  previously healed this wound on one occasion but it keeps on reopening [medial side] 6/10; deep tissue culture I did last week I think on the left medial wound showed both moderate E. coli and moderate staph aureus [MSSA]. She is going to require antibiotics and I have chosen Augmentin. We have been using Sorbact and we have made better looking wound surface on both sides but certainly no improvement in wound area. She was back in last Friday apparently for a dressing changes the wrap was hurting her outer left ankle. She has not managed to get a hold of vein and vascular in [place]. We are going to have to make her that appointment 6/17; patient is tolerating the Augmentin. She had an MRI that I think was ordered by orthopedic surgeon this did not show osteomyelitis or an abscess did suggest cellulitis. We have been using Sorbact to the lateral and medial ankles. We have been trying to arrange a follow-up appointment with vein and vascular in [place] or did her original ablations. We apparently an area sent the request to vein and vascular in [facility] 6/24; patient has completed the Augmentin. We do not yet have a vein and vascular appointment in [place]. I  am not sure what the issue is here we have asked her to call tomorrow. We are using Sorbact. Making some improvements and especially the medial wound. Both surfaces however look better medial and lateral. 7/1; the patient has been in contact with vein and vascular in [place] but has not yet received an appointment. Using Sorbact we have gradually improve the wound surface with no improvement in surface area. She is approved for Apligraf but the wound surface still is not completely viable. She has not had to come in for a dressing change 7/8; the patient has an appointment with vein and vascular on 7/31 which is a Friday afternoon. She is concerned about getting back here for [place] to dress her wounds. I think it is important to have  them goal for her venous reflux/history of ablations etc. to see if anything else can be done. She apparently tested positive for 1 of the blood tests with regards to lupus and saw a rheumatologist. He has raised the issue of vasculitis again. I have had this thought in the past however the evidence seems overwhelming that this is a venous reflux etiology. If the rheumatologist tells me there is clinical and laboratory investigation is positive for lupus I will rethink this. 7/15; the patient's wound surfaces are quite a bit better. The medial area which was her original wound now has no depth although the lateral wound which was the more recent area actually appears larger. Both with viable surfaces which is indeed better. Using Sorbact. I wanted to use Apligraf on her however there is the issue of the vein and vascular appointment on 7/31 at 2:00 in the afternoon which would not allow her to get back to be rewrapped and they would no doubt remove the graft 7/22; the patient's wound surfaces have moderate amount of debris although generally look better. The lateral one is larger with 2 small satellite areas superiorly. We are waiting for her vein and vascular appointment on 7/31. She has been approved for Apligraf which I would like to use after th 7/29; wound surfaces have improved no debridement is required we have been using Sorbact. She sees vein and vascular on Friday with this so question of whether anything can be done to lessen the likelihood of recurrence and/or speed the healing of these areas. She is already had previous ablations. She no doubt has severe venous hypertension 8/5-Patient returns at 1 week, she was in [place] for 3 days by her podiatrist, we have been using so backed to the wound, she has increased pain in both the wounds on the left lower leg especially the more distal one on the lateral aspect 8/12-Patient returns at 1 week and she is agreeable to having debridement in  both wounds on her left leg today. We have been using Sorbact, and vascular studies were reviewed at last visit 8/19; the patient arrives with her wounds fairly clean and no debridement is required. We have used Sorbact which is really done a nice job in cleaning up these very difficult wound surfaces. The patient saw Dr. Donzetta Matters of vascular surgery on 7/31. He did not feel that there was an arterial component. He felt that her treated greater saphenous vein is adequately addressed and that the small saphenous vein did not appear to be involved significantly. She was also noted to have deep venous reflux which is not treatable. Dr. Donzetta Matters mentioned the possibility of a central obstructive component leading to reflux and he offered her central  venography. She wanted to discuss this or think about it. I have urged her to go ahead with this. She has had recurrent difficult wounds in these areas which do heal but after months in the clinic. If there is anything that can be done to reduce the likelihood of this I think it is worth it. 9/2 she is still working towards getting follow-up with Dr. Donzetta Matters to schedule her CT. Things are quite a bit worse venography. I put Apligraf on 2 weeks ago on both wounds on the medial and lateral part of her left lower leg. She arrives in clinic today with 3 superficial additional wounds above the area laterally and one below the wound medially. She describes a lot of discomfort. I think these are probably wrapped injuries. Does not look like she has cellulitis. 07/20/2019 on evaluation today patient appears to be doing somewhat poorly in regard to her lower extremity ulcers. She in fact showed signs of erythema in fact we may even be dealing with an infection at this time. Unfortunately I am unsure if this is just infection or if indeed there may be some allergic reaction that occurred as a result of the Apligraf application. With that being said that would be unusual but  nonetheless not impossible in this patient is one who is unfortunately allergic to quite a bit. Currently we have been using the Sorbact which seems to do as well as anything for her. I do think we may want to obtain a culture today to see if there is anything showing up there that may need to be addressed. 9/16; noted that last week the wounds look worse in 1 week follow-up of the Apligraf. Using Sorbact as of 2 days ago. She arrives with copious amounts of drainage and new skin breakdown on the back of the left calf. The wounds arm more substantial bilaterally. There is a fair amount of swelling in the left calf no overt DVT there is edema present I think in the left greater than right thigh. She is supposed to go on 9/28 for CT venography. The wounds on the medial and lateral calf are worse and she has new skin breakdown posteriorly at least new for me. This is almost developing into a circumferential wound area The Apligraf was taken off last week which I agree with things are not going in the right direction a culture was done we do not have that back yet. She is on Augmentin that she started 2 days ago 9/23; dressing was changed by her nurses on Monday. In general there is no improvement in the wound areas although the area looks less angry than last week. She did get Augmentin for MSSA cultured on the 14th. She still appears to have too much swelling in the left leg even with 3 layer compression 9/30; the patient underwent her procedure on 9/28 by Dr. Donzetta Matters at vascular and vein specialist. She was discovered to have the common iliac vein measuring 12.2 mm but at the level of L4-L5 measured 3 mm. After stenting it measured 10 mm. 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 Rouleaux flow in the common femoral and femoral vein was observed much improved after stenting. We are using silver alginate to the wounds on the medial and lateral ankle on the left. 4 layer compression 10/7;  the patient had fluid swelling around her knee and 4 layer compression. At the advice of vein and vascular this was reduced to 3 layer which  she is tolerating better. We have been using silver alginate under 3 layer compression since last Friday FREDRIKA, CANBY ([id number]) 10/14; arrives with the areas on the left ankle looking a lot better. Inflammation in the area also a lot better. She came in for a nurse check on 10/9 10/21; continued nice improvement. Slight improvements in surface area of both the medial and lateral wounds on the left. A lot of the satellite lesions in the weeping erythema around these from stasis dermatitis is resolved. We have been using silver alginate 10/28; general improvement in the entire wound areas although not a lot of change in dimensions the wound certainly looks better. There is a lot less in terms of venous inflammation. Continue silver alginate this week however look towards Hydrofera Blue next week 11/4; very adherent debris on the medial wound left wound is not as bad. We have been using silver alginate. Change to [facility] today 11/11; very adherent debris on both wound areas. She went to vein and vascular last week and follow-up they put in [place] boot on this today. He says the [facility] was adherent. Wound is definitely not as good as last week. Especially on the left there the satellite lesions look more prominent 11/18; absolutely no better. erythema on lateral aspect with tenderness. 09/30/2019 on evaluation today patient appears to actually be doing better. Dr. Dellia Nims did put her on doxycycline last week which I do believe has helped her at this point. Fortunately there is no signs of active infection at this time. No fevers, chills, nausea, vomiting, or diarrhea. I do believe he may want extend the doxycycline for 7 additional days just to ensure everything does completely cleared up the patient is in agreement with that plan. Otherwise she  is going require some sharp debridement today 12/2; patient is completing a 2-week course of doxycycline. I gave her this empirically for inflammation as well as infection when I last saw her 2 weeks ago. All of this seems to be better. She is using silver alginate she has the area on the medial aspect of the larger area laterally and the 2 small satellite regions laterally above the major wound. 12/9; the patient's wound on the left medial and left lateral calf look really quite good. We have been using silver alginate. She saw vein and vascular in follow-up on 10/09/2019. She has had a previous left greater saphenous vein ablation by Dr. Oscar La in 2016. More recently she underwent a left common iliac vein stent by Dr. 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 The swelling is improved and certainly the wounds have improved. The patient shows [place] today area on the right medial calf there is almost no wound but leaking lymphedema. She says she start this started 3 or 4 days ago. She did not traumatize it. It is not painful. She does not wear compression on that side 12/16; the patient continues to do well laterally. Medially still requiring debridement. The area on the right calf did not materialize to anything and is not currently open. We wrapped this last time. She has support stockings for that leg although I am not sure they are going to provide adequate compression 12/23; the lateral wound looks stable. Medially still requiring debridement for tightly adherent fibrinous debris. We've been using silver alginate. Surface area not any different 12/30; neither wound is any better with regards to surface and the area on the left lateral is larger. I been using silver alginate  to the left lateral which look quite good last week and Sorbact to the left medial 11/11/2019. Lateral wound area actually looks better and somewhat smaller. Medial still requires a very aggressive debridement today. We  have been using Sorbact on both wound areas 1/13; not much better still adherent debris bilaterally. I been using Sorbact. She has severe venous hypertension. Probably some degree of dermal fibrosis distally. I wonder whether tighter compression might help and I am going to try that today. We also need to work on the bioburden 1/20; using Sorbact. She has severe venous hypertension status post stent placement for pelvic vein compression. We applied gentamicin last time to see if we could reduce bioburden I had some discussion with her today about the use of pentoxifylline. This is occasionally used in this setting for wounds with refractory venous insufficiency. However this interacts with Plavix. She tells me that she was put on this after stent placement for 3 months. She will call Dr. Claretha Cooper office to discuss 1/27; we are using gentamicin under Sorbact. 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 She has a stent. Wound medially is measuring smaller this week. Laterally measuring slightly larger although she has some satellite lesions superiorly 2/3; gentamicin under Sorbact under 4-layer compression. 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 She has a stent on Plavix. Her wounds are measuring smaller this week. More substantially laterally where there is a satellite lesion superiorly. 2/10; gentamicin under Sorbac. 4-layer compression. Patient communicated with Dr. Donzetta Matters at vein and vascular in [place]. He is okay with the patient coming off Plavix I will therefore start her on pentoxifylline for a 1 month trial. In general her wounds look better today. I had some concerns about swelling in the left thigh however she measures 61.5 on the right and 63 on the mid thigh which does not suggest there is any difficulty. The patient is not describing any pain. 2/17; gentamicin under Sorbac 4-layer compression. She has been on pentoxifylline for 1  week and complains of loose stool. No nausea she is eating and drinking well 2/24; the patient apparently came in 2 days ago for a nurse visit when her wrap fell down. Both areas look a little worse this week macerated medially and satellite lesions laterally. Change to silver alginate today 3/3; wounds are larger today especially medially. She also has more swelling in her foot lower leg and I even noted some swelling in her posterior thigh which is tender. I wonder about the patency of her stent. Fortuitously she sees Dr. Claretha Cooper group on Friday 3/10; Mrs. [name] was seen by vein and vascular on 3/5. The patient underwent ultrasound. There was no evidence of thrombosis involving the IVC no evidence of thrombosis involving the right common iliac vein there is no evidence of thrombosis involving the right external iliac vein the left external vein is also patent. The right common iliac vein stent appears patent bilateral common femoral veins are compressible and appear patent. I was concerned about the left common iliac stent however it looks like this is functional. She has some edema in the posterior thigh that was tender she still has that this week. I also note they had trouble finding the pulses in her left foot and booked her for an ABI baseline in 4 weeks. She will follow up in 6 months for repeat IVC duplex. The patient stopped the pentoxifylline because of diarrhea. It does not look like that was being effective in any case.  I have advised her to go back on her aspirin 81 mg tablet, vascular it also suggested this 3/17; comes in today with her wound surfaces a lot better. The excoriations from last week considerably better probably secondary to the TCA. We have been using silver alginate 3/24; comes in today with smaller wounds both medially and laterally. Both required debridement. There are 2 small satellite areas superiorly laterally. She also has a very odd bandlike area in the mid  calf almost looking like there was a weakness in the wrap in a localized area. I would write this off as being this however anteriorly she has a small raised ballotable area that is very tender almost reminiscent of an abscess but there was no obvious purulent surface to it. [name], [name] ([id number]) 02/04/20 upon evaluation today patient appears to be doing fairly well in regard to her wounds today. Fortunately there is no signs of active infection at this time. No fevers, chills, nausea, vomiting, or diarrhea. She has been tolerating the dressing changes without complication. Fortunately I feel like she is showing signs of improvement although has been sometime since have seen her. Nonetheless the area of concern that Dr. Dellia Nims had last week where she had possibly an area of the wrap that was we can allow the leg to bulge appears to be doing significantly better today there is no signs of anything worsening. 4/7; the patient's wounds on her medial and lateral left leg continue to contract. We have been using a regular alginate. Last week she developed an area on the right medial lower leg which is probably a venous ulcer as well. 4/14; the wounds on her left medial and lateral lower leg continue to contract. Surface eschar. We have been using regular alginate. The area on the right medial lower leg is closed. We have been putting both legs under 4-layer contraction. The patient went back to see vein and vascular she had arterial studies done which were apparently "quite good" per the patient although I have not read their notes I have never felt she had an arterial issue. 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 This is been longstanding and refractory to exercise, leg elevation and longstanding use of compression wraps in our clinic as well as compression stockings on the times we have been able to get these to heal 4/21; we thought she actually  might be close this week however she arrives in clinic with a lot of edema in her upper left calf and into her posterior thigh. This is been an intermittent problem here. She says the wrap fell down but it was replaced with a nurse visit on Monday. We are using calcium alginate to the wounds and the wound sizes there not terribly larger than last week but there is a lot more edema 4/28; again wound edges are smaller on both sides. Her edema is better controlled than last time. She is obtained her compression pumps from [employer] although they have not been to her home to set these up. 5/5; left medial and left lateral both look stable. I am not sure the medial is any smaller. We have been using calcium alginate under 4-layer compression. She had an area on the right medial. This was eschared today. We have been wrapping this as well. She does not tolerate external compression stockings due to a history of various contact allergies. She has her compression pumps however the representative from the company is coming on  her to show her how to use these tomorrow 5/19; patient with severe chronic venous insufficiency secondary to central venous disease. She had a stent placed in her left common iliac vein. She has done better since but still difficult to control wounds. She comes in today with nothing open on the right leg. Her areas on the left medial and left lateral are just about closed. We are using calcium alginate under 4-layer compression. She is using her external compression pumps at home She only has 15-20 support stockings. States she cannot get anything tighter than that on. 03/30/20-Patient returns at 1 week, the wounds on the left leg are both slightly bigger, the last week she was on 3 layer compression which started to slide down. She is starting to use her lymphedema pumps although she stated on 1 day her right ankle started to swell up and she have to stop that  day. Unfortunately the open area seem to oscillate between improving to the point of healing and then flaring up all to do with effectiveness of compression or lack of due to the left leg topography not keeping the compression wraps from rolling down 6/2 patient comes in with a 15/20 mmHg stocking on the right leg. She tells me that she developed a lot of swelling in her ankles she saw orthopedics she was felt to possibly be having a flare of pseudogout versus some other type of arthritis. She was put on steroids for a respiratory issue so that helps with the inflammation. She has not been using the pumps all week. She thinks the left thigh is more swollen than usual and I would agree with that. She has an appointment with Dr. Donzetta Matters 9 days or so from now 6/9; both wounds on the left medial and left lateral are smaller. We have been using calcium alginate under compression. She does not have an open wound on the right leg she is using a stocking and her compression pumps things are going well. She has an appointment with Dr. Donzetta Matters with regards to her stent in the left common iliac vein 6/16; the wounds on the left medial and left lateral ankle continues to contract. The patient saw Dr. Donzetta Matters and I think he seems satisfied. Ordered follow-up venous reflux studies on both sides in September. Cautioned that she may need thigh-high stockings. She has been using calcium alginate under compression on the left and her own stocking on the right leg. She tells [place] there are no open wounds on the right 6/23; left lateral is just about closed. Medial required debridement today. We have been using calcium alginate. Extensive discussion about the compression pumps she is only using these on 25 mmHg states she could not take 40 or 30 when the wrap came out to her home to demonstrate these. He said they should not feel tight 6/30; the left lateral wound has a slight amount of eschar. . The area medially is about the  same using Hydrofera Blue. 7/7; left lateral wound still has some eschar. I will remove this next week may be closed. The area medially is very small using Hydrofera Blue with improvement. Unfortunately the stockings fell down. Unfortunately the blisters have developed at the edge of where the wrap fell. When this happened she says her legs hurt she did not use her pumps. We are not open Monday for her to come in and change the wraps and she had an appointment yesterday. She also tells me that she is going  to have an MRI of her back. She is having pain radiating into her left anterior leg she thinks her from an L5 disc. She saw Dr. Ellene Route of neurosurgery 7/14; the area on the left lateral ankle area is closed. Still a small area medially however it looks better as well. We have been using Hydrofera Blue under 4-layer compression 7/21; left lateral ankle is still closed however her wound on the medial left calf is actually larger. This is probably because Hydrofera Blue got stuck to the wound. She came in for a nurse change on Friday and will do that again this week I was concerned about the amount of swelling that she had last week however she is using her compression pumps twice a day and the swelling seems well controlled 7/28; remaining wound on the left medial lower leg is smaller. We have been using moistened silver collagen under compression she is coming back for a nurse visit. For reasons that were not really clear she was just keeping her legs elevated and not using her compression pumps. I have asked her to use the compression pumps. She does not have any wounds on the right leg 06/15/20-Patient returns at 2 weeks, her LLE edema is worse and she developed a blister wound that is new and has bigger posterior calf wound on right, we are using Prisma with pad, 4 layer compression. she has been on lasix 40 mg daily 8/18; patient arrives today with things a lot worse than I remember from a few  weeks ago. She was seen last week. Noted that her edema was worse and that she now had a left lateral wound as well as deteriorating edema in the medial and posterior part of the lower leg. She says she is using his or her external compression pumps once a day although I wonder about the compliance. 8/25; weeping area on the right medial lower leg. This had actually gotten a small localized area of her compression stocking wet. On the left side there is a large denuded area on the posterior medial lower leg and smaller area on the lateral. This was not the original areas that we dealt with. 9/1 the patient's wound on the left leg include the left lateral and left posterior. Larger superficial wounds weeping. She has very poor edema control. Tender localized edema in the left lower medial ankle/heel probably because of localized wrap issues. She freely admits she is not using the compression pumps. She has been up on her feet a lot. She thinks the hydrofera blue is contributing to the pain she is experiencing.. This is a complaint that I have occasionally heard [name], [name] ([id number]) 9/8; really not much improvement. The patient is still complaining of a lot of pain particularly when she uses compression pumps. I switched her to silver alginate last time because she found the Hydrofera Blue to be irritating. I don't hear much difference in her description with the silver alginate. 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 She has follow-up with Dr. Donzetta Matters I think for ultrasound next month 9/15; quite a bit of improvement today. We have less edema and more epithelialization in both of her wound areas on the left medial and left lateral calf. These are not the site of her original wounds in this area. She says she has been using her compression pumps for 30 minutes twice a day, there pain  issues that never quite  understood. Silver alginate as the primary dressing 9/22; continued improvement. Both areas medially and laterally still have a small open area there is some eschar. She continues to complain of left medial ankle pain. Swelling in the leg is in much better condition. We have been using silver alginate 9/29; continued improvement. Both areas medially and laterally in the left calf look as though they are close some minor surface eschar but I think this is epithelialized. She comes in today saying she has a ruptured disc at L4-L5 cannot bend over to put on her stockings. 10/6; patient comes in today with no open wounds on either leg. However her edema on the left leg in the upper one third of the lower leg is poorly controlled nonpitting. She says that she could not use the pumps for 2 days and then she has been using the last couple of days. It is not clear to me she has been able to get her stocking on. She has back problems. Mrs. Hemmerich has severe chronic venous insufficiency with secondary lymphedema. Her venous insufficiency is partially centrally mediated and that she is now post stent in the left common iliac vein. [facility]  Thurner's syndrome/physiology]. She follows up with them on 10/15. She wears 20/30 below-knee stockings. She is supposed to use compression pumps at home although I think her compliance about with this is been less than 100%. I have asked her to use these 3 times a day. Finally I think she has lipodermatosclerosis in the left lower leg with an inverted bottle sign. It is been a major problem controlling the edema in the left leg. The right leg we have had wounds on but not as significant a problem is on the left READMISSION 04/12/2021 Mrs. Linhart is a 76 year old woman we know well in this clinic. She has severe chronic venous insufficiency. 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 I believe she has had bilateral greater saphenous vein  ablation in the past as well. She tells me that this wound opened sometime in March. She had a fall and thinks it was initially abrasion. She developed areas she describes as little blisters on the anterior part of her leg and she saw dermatology and was treated for methicillin staph aureus with several rounds of antibiotics. She has been using support stockings on the left leg and says this is the only thing she can get on. Her compression pump use maybe once a day she says if she did not use one she use the other. She comes in today with incredible swelling in the left leg with a wound on the left posterior calf. She has been using Neosporin to this previously a hydrocolloid. 6/15; patient arrives back for 1 week follow-up.Marland Kitchen Apparently her wrap fell down she did not call us to replace this. He has poor edema control. She only uses her compression pumps once a day 6/29; patient presents for 1 week follow-up. She has tolerated the compression wrap well. We have been using Iodoflex under the wrap. She has no issues or complaints today. 7/6; patient presents for 1 week follow-up. She states that the compression wrap rolled down her leg 4 days ago. She has been trying to keep the area covered but has no dressings at home to use. She denies signs of infection. 7/13; patient presents for 1 week follow-up. She states that her compression wrap rolled down her right leg and she  called our office and had it placed a few days ago. She has been tolerating the current wrap well. She states that the Iodoflex is causing a burning sensation. She denies signs of infection. 7/27; patient presents for 1 week follow-up She has tolerated the compression wrap well with Hydrofera Blue underneath. She has now developed a wound to her right lower extremity. She reports having a culture done To this area by her dermatologist that she reports is negative. She currently denies signs of infection. 8/30; patient presents for 1  week follow-up. On the left side she has tolerated the compression wrap well with Hydrofera Blue underneath. She reports some discomfort to her right lower extremity with the 3 layer compression. She currently denies signs of infection. 06/14/2021 upon evaluation today patient's wound actually appears to be doing decently well based on what I am seeing currently. She actually has 2 areas 1 on the left distal/posterior lower leg and the other on the right lower leg. Subsequently the measurements are roughly the same may be slightly smaller but in general have not made any trend towards getting worse which is great news. 06/23/2021 upon evaluation today patient appears to be doing pretty well in regard to her wounds. On the right she is having difficulty with sciatic pain and this subsequently has led to her taking the wrap off over the last week her leg is more swollen she is also been on prednisone for 10 days. With that being said I think we may want to just use a compression sock on the left that way she will not have to worry about the compression wrap being in place that she cannot get off. With that being said I do believe as well that the patient is going to need to continue with the wrapping on the left we will also need to do a little bit of sharp debridement today. 8/24; patient presents for 1 week follow-up. She has no issues or complaints today. She denies signs of infection. 8/31; patient presents for 1 week follow-up. She has no issues or complaints today. She has tolerated the compression wrap well on the left lower extremity. She denies signs of infection. 9/7; patient presents for follow-up. She reports that the compression wrap rolled down slightly on her right lower extremity. She reports tenderness to the wound bed. She denies increased warmth or erythema to the surrounding skin. She overall feels well. 9/14; patient presents for follow-up. She has no issues or complaints today. She  denies signs of infection. PuraPly is available and patient would like to start this today. 9/21; patient presents for follow-up. She has no issues or complaints today. She tolerated the first skin substitute placement last week under compression. 9/28; patient presents for 1 week follow-up. She has no issues or complaints today. 10/5; patient presents for 1 week follow-up. She reports taking the wrap off yesterday. She was on her feet more yesterday and developed more swelling to her left lower extremity. She denies pain. Overall she is doing well. LEILAH, POLIMENI ([id number]) 10/12; patient presents for 1 week follow-up. She has no issues or complaints today. 10/19; patient presents for follow-up. She has no issues or complaints today. She tolerated the compression wrap well. She states she has compression stockings at home. Readmission: 11/27/2021 upon evaluation today patient appears to be doing poorly in regard to the wounds on her right leg. She also has an area of erythema in the proximal calf area of the right leg which  again I do believe is a issue from the standpoint of it being warm and erythematous to touch and also seems to be somewhat localized I really think this may be more of a cellulitis issue than anything else. Fortunately I do not see any signs of active infection Systemically at this time. 12/05/2021 upon evaluation patient's wounds actually appear to be showing some signs of improvement which is not too bad and just 1 week's time. Especially since we had to get on the right antibiotic which is only been for the past 4 days. Fortunately I do not see any signs of active infection locally nor systemically at this time that has gotten any worse. Overall I think that again with the antibiotic she is significantly improved and currently she is taking Levaquin. 12/11/2021 upon evaluation today patient appears to be doing well with regard to her leg. Between the antibiotics and the  triamcinolone I feel like she is doing significantly better which is great news. Overall I am extremely pleased with where we stand. There does not appear to be any signs of active infection locally or systemically at this time. 12/18/2021 upon evaluation today patient appears to be doing well with regard to her wound. I do feel like this is showing signs of some improvement here. Little by little this is clearing up quite nicely. I think it is just a slow process but nonetheless 1 that we are seeing evidence of improvement with regard to. 12/25/2021 upon evaluation today patient appears to be doing well with regard to her wounds she is definitely making some good progress here. Fortunately I do not see any signs of active infection locally or systemically at this time which is great news. No fevers, chills, nausea, vomiting, or diarrhea. 01/02/2022 upon evaluation today patient appears to be doing well with regard to her wound. She has been tolerating the dressing changes without complication. Fortunately I do not see any evidence of active infection locally or systemically which is great news and overall I am extremely pleased with where we stand today. 01/08/2022 upon evaluation today patient appears to be doing better in regard to her ankle ulcers. She has been tolerating the dressing changes without complication. Fortunately I do not see any signs of infection and both the ankle and leg area is making great progress. 3/13; patient presents for follow-up. She has no issues or complaints today. She was tolerated the compression wrap well. 01/19/2022 upon evaluation today patient appears to be doing well currently in regard to her right leg which overall I think is doing excellent. She had called in to try to get her in sooner for a dressing change due to an issue where her cat scratched her on the left leg. With that being said it does not appear that this was actually a scratch but rather that it was  a puncture over a small varicose vein. She had a tremendous amount of bleeding as evidenced by the amount of blood in her shoes currently. The good news is this is stopped currently the bad news is that she needs to be very careful with regard to her cats later as this could definitely be a significant problem for her. Fortunately I do not see any signs of active infection locally nor systemically at this point. 02-08-2022 on evaluation today patient actually appears to be doing significantly better. She was in the hospital due to having stepped on attack on her left heel but nonetheless this is actually showing signs  of improvement at this point. I do not see any evidence of active infection locally or systemically which is great news and overall I am extremely pleased with where we stand today. Her right leg is showing no signs of infection. She has been on antibiotics while in the hospital and at discharge. 02-15-2022 upon evaluation today patient appears to be doing better in regard to her wounds. Overall I feel like we are headed in the right direction. With that being said she does have an area of irritation posteriorly that was not there last week again that makes this wound posterior look little bigger but overall things are significantly better. I am very pleased in that regard. 02-23-2022 upon evaluation today patient appears to be doing well with regard to her wound. It is doing a little bit worse only and the fact that it is dry other than that things seem to be making good improvements here. There does not appear to be any signs of active infection at this time which is great news. 4/27; right lateral leg 4-5 small open areas with eschar on top. I used a curette to remove this. She has been using Xeroform under 3 layer compression. 03-08-2022 upon evaluation today patient appears to be doing well with regard to the wounds on her lower extremity. She has been tolerating the dressing changes  without complication. Fortunately there does not appear to be any signs of active infection at this time which is great news. No fevers, chills, nausea, vomiting, or diarrhea. 03-15-2022 upon evaluation today patient appears to be doing well currently in regard to her wounds. She has been tolerating the dressing changes without complication. Fortunately there does not appear to be any evidence of active infection locally nor systemically at this time which is great news. I do believe she is getting very close to resolution. 03-22-2022 upon evaluation today patient actually appears to be doing awesome in fact she is has 1 very tiny area immediately left open the rest of this appears to be almost completely closed. I do not see any evidence of infection locally or systemically which is great news and overall I am extremely pleased with where things stand at this point. I do think we are getting very close to complete resolution. 03-29-2022 upon evaluation today patient appears to be doing well currently in regard to her wound. In fact this appears to be almost completely healed. I think that if she still continues to have no significant drainage come next week that we will probably get a go ahead and heal her out at that point. 04-05-2022 upon evaluation today patient appears to be doing well with regard to her wounds which pretty much looking to be completely closed. With that being said she does have a very tiny area still medial that appears to be leaking a little bit her legs really swollen as she had a wrap Kobel, [place]. ([id number]) slipped down over the nighttime and this subsequently has been off all day meaning her leg is much more swollen than it was last week. Subsequently I am going to go and see what I can do about clearing away some of the necrotic debris here. 04-11-2022 upon evaluation today patient's wound actually is really almost nonexistent there is definitely some evidence of drainage  but really I do not see anything significantly open which is great news. Fortunately I do not believe that she is showing any signs of active infection right now which is great her  swelling is significantly down. Objective Constitutional [name] and well-hydrated in no acute distress. Vitals Time Taken: 1:03 PM, Height: 62 in, Weight: 199 lbs, BMI: 36.4, Temperature: 98.1 F, Pulse: 83 bpm, Respiratory Rate: 18 breaths/min, Blood Pressure: 154/80 mmHg. Respiratory normal breathing without difficulty. Psychiatric this patient is able to make decisions and demonstrates good insight into disease process. Alert and Oriented x 3. pleasant and cooperative. General Notes: Upon inspection patient's wound bed showed evidence of good granulation epithelization at this point. Fortunately I do not see any signs of active infection locally or systemically which is great and overall I think she is doing well her swelling is definitely down and well on the right track here. Integumentary (Hair, Skin) Wound #16 status is Open. Original cause of wound was Gradually Appeared. The date acquired was: 10/26/2021. The wound has been in treatment 15 weeks. The wound is located on the Right,Medial Lower Leg. The wound measures 0.1cm length x 0.1cm width x 0.1cm depth; 0.008cm^2 area and 0.001cm^3 volume. There is no tunneling or undermining noted. There is a medium amount of serosanguineous drainage noted. There is no granulation within the wound bed. There is a large (67-100%) amount of necrotic tissue within the wound bed including Adherent Slough. Assessment Active Problems ICD-10 Lymphedema, not elsewhere classified Chronic venous hypertension (idiopathic) with ulcer and inflammation of right lower extremity Non-pressure chronic ulcer of other part of right lower leg limited to breakdown of skin Procedures There was a Three Layer Compression Therapy Procedure by Levora Dredge, RN. Post procedure  Diagnosis Wound #: Same as Pre-Procedure Plan Follow-up Appointments: Return Appointment in 1 week. Nurse Visit as needed CHIKITA, DOGAN ([id number]) Bathing/ Shower/ Hygiene: May shower with wound dressing protected with water repellent cover or cast protector. No tub bath. Anesthetic (Use 'Patient Medications' Section for Anesthetic Order Entry): Lidocaine applied to wound bed Edema Control - Lymphedema / Segmental Compressive Device / Other: Optional: One layer of unna paste to top of compression wrap (to act as an anchor). - she needs this Elevate, Exercise Daily and Avoid Standing for Long Periods of Time. Elevate leg(s) parallel to the floor when sitting. DO YOUR BEST to sleep in the bed at night. DO NOT sleep in your recliner. Long hours of sitting in a recliner leads to swelling of the legs and/or potential wounds on your backside. Additional Orders / Instructions: Follow Nutritious Diet and Increase Protein Intake WOUND #16: - Lower Leg Wound Laterality: Right, Medial Cleanser: Soap and Water 1 x Per Week/15 Days Discharge Instructions: Gently cleanse wound with antibacterial soap, rinse and pat dry prior to dressing wounds Cleanser: Wound Cleanser 1 x Per Week/15 Days Discharge Instructions: Wash your hands with soap and water. Remove old dressing, discard into plastic bag and place into trash. Cleanse the wound with Wound Cleanser prior to applying a clean dressing using gauze sponges, not tissues or cotton balls. Do not scrub or use excessive force. Pat dry using gauze sponges, not tissue or cotton balls. Primary Dressing: Xeroform-HBD 2x2 (in/in) 1 x Per Week/15 Days Discharge Instructions: Apply Xeroform-HBD 2x2 (in/in) as directed Secondary Dressing: Gauze 1 x Per Week/15 Days Compression Wrap: 3-LAYER WRAP - Profore Lite LF 3 Multilayer Compression Bandaging System 1 x Per Week/15 Days Discharge Instructions: Apply 3 multi-layer wrap as prescribed. 1. I am get a  recommend that we continue with the wound care measures as before and the patient is in agreement with the plan this includes the use of the compression wrapping. I  think that is doing a good job but again her swelling is back down we are doing a 3 layer compression wrap. 2. We will continue with Xeroform over the area in question hopefully that we will get this sealed up even possibly by next time she has her compression socks to bring along with her. We will see patient back for reevaluation in 1 week here in the clinic. If anything worsens or changes patient will contact our office for additional recommendations. Electronic Signature(s) Signed: 04/11/2022 2:08:16 PM By: Worthy Keeler PA-C Entered By: Worthy Keeler on 04/11/2022 14:08:16 Dejoy, [name] ([id number]) -------------------------------------------------------------------------------- SuperBill Details Patient Name: JULIA, ALKHATIB. Date of Service: 04/11/2022 Medical Record Number: [id number] Patient Account Number: [id number] Date of Birth/Sex: September 06, 1946 (76 y.o. F) Treating RN: Levora Dredge Primary Care Provider: [name] Other Clinician: Referring Provider: [name] Treating Provider/Extender: [name] in Treatment: 19 Diagnosis Coding ICD-10 Codes Code Description I89.0 Lymphedema, not elsewhere classified I87.331 Chronic venous hypertension (idiopathic) with ulcer and inflammation of right lower extremity L97.811 Non-pressure chronic ulcer of other part of right lower leg limited to breakdown of skin Facility Procedures CPT4 Code: [id number] Description: (Facility Use Only) [phone] - [place] L[facility] RT LEG Modifier: Quantity: 1 Physician Procedures CPT4 Code Description: [id number] - WC PHYS LEVEL 3 - EST PT Modifier: Quantity: 1 CPT4 Code Description: ICD-10 Diagnosis Description I89.0 Lymphedema, not elsewhere classified I87.331 Chronic venous hypertension (idiopathic)  with ulcer and inflammation of rig L97.811 Non-pressure chronic ulcer of other part of right lower leg limited  to brea Modifier: ht lower extremit kdown of skin Quantity: y Engineer, maintenance) Signed: 04/11/2022 2:08:26 PM By: Worthy Keeler PA-C Entered By: Worthy Keeler on 04/11/2022 14:08:25

## 2022-04-11 NOTE — Progress Notes (Signed)
Amber, [name] ([id number]) Visit Report for 04/11/2022 Arrival Information Details Patient Name: [name], [name]. Date of Service: 04/11/2022 1:00 PM Medical Record Number: [id number] Patient Account Number: [id number] Date of Birth/Sex: May 27, 1946 (76 y.o. F) Treating RN: Levora Dredge Primary Care Chantelle Verdi: [name] Other Clinician: Referring Ell Tiso: [name] Treating Fannie Alomar/Extender: [name] in Treatment: 19 Visit Information History Since Last Visit Added or deleted any medications: No Patient Arrived: Ambulatory Any new allergies or adverse reactions: No Arrival Time: 13:02 Had a fall or experienced change in No Accompanied By: self activities of daily living that may affect Transfer Assistance: None risk of falls: Patient Identification Verified: Yes Hospitalized since last visit: No Secondary Verification Process Completed: Yes Has Dressing in Place as Prescribed: Yes Patient Requires Transmission-Based No Has Compression in Place as Prescribed: Yes Precautions: Pain Present Now: No Patient Has Alerts: Yes Patient Alerts: Patient on Blood Thinner 58m aspirin [place] vascular ABI Electronic Signature(s) Signed: 04/11/2022 4:16:45 PM By: GLevora DredgeEntered By: GLevora Dredgeon 04/11/2022 13:03:02 PJannifer Franklin([id number] -------------------------------------------------------------------------------- Clinic Level of Care Assessment Details Patient Name: [name], [name] Date of Service: 04/11/2022 1:00 PM Medical Record Number: [id number] Account Number: [id number] of Birth/Sex: [birth year](76y.o. F) Treating RN: GLevora DredgePrimary Care Shanara Schnieders: GRia BushOther Clinician: Referring Nathanel Tallman: GRia BushTreating Geana Walts/Extender: SSkipper Clichein Treatment: 19 Clinic Level of Care Assessment Items TOOL 1 Quantity Score _0  - Use when EandM and Procedure is performed on INITIAL visit  0 ASSESSMENTS - Nursing Assessment / Reassessment _1  - General Physical Exam (combine w/ comprehensive assessment (listed just below) when performed on new 0 pt. evals) _2  - 0 Comprehensive Assessment (HX, ROS, Risk Assessments, Wounds Hx, etc.) ASSESSMENTS - Wound and Skin Assessment / Reassessment _3  - Dermatologic / Skin Assessment (not related to wound area) 0 ASSESSMENTS - Ostomy and/or Continence Assessment and Care _4  - Incontinence Assessment and Management 0 _5  - 0 Ostomy Care Assessment and Management (repouching, etc.) PROCESS - Coordination of Care _6  - Simple Patient / Family Education for ongoing care 0 _7  - 0 Complex (extensive) Patient / Family Education for ongoing care _8  - 0 Staff obtains CProgrammer, systems Records, Test Results / Process Orders _9  - 0 Staff telephones HHA, Nursing Homes / Clarify orders / etc _10  - 0 Routine Transfer to another Facility (non-emergent condition) _11  - 0 Routine Hospital Admission (non-emergent condition) _12  - 0 New Admissions / IBiomedical engineer/ Ordering NPWT, Apligraf, etc. _13  - 0 Emergency Hospital Admission (emergent condition) PROCESS - Special Needs _14  - Pediatric / Minor Patient Management 0 _15  - 0 Isolation Patient Management _16  - 0 Hearing / Language / Visual special needs _17  - 0 Assessment of Community assistance (transportation, D/C planning, etc.) _18  - 0 Additional assistance / Altered mentation _19  - 0 Support Surface(s) Assessment (bed, cushion, seat, etc.) INTERVENTIONS - Miscellaneous _20  - External ear exam 0 _21  - 0 Patient Transfer (multiple staff / HCivil Service fast streamer/ Similar devices) _22  - 0 Simple Staple / Suture removal (25 or less) _23  - 0 Complex Staple / Suture removal (26 or more) _24  - 0 Hypo/Hyperglycemic Management (do not check if billed separately) _25  - 0 Ankle / Brachial Index (ABI) - do not check if billed separately Has the patient been seen at the hospital within the last three years:  Yes Total Score: 0 Level Of Care: ____ PJannifer Franklin([id number] Electronic Signature(s) Signed: 04/11/2022 4:16:45 PM By: GLevora DredgeEntered By: GAraceli Bouche  Caitlin on 04/11/2022 13:44:02 SHIRELY, TOREN ([id number]) -------------------------------------------------------------------------------- Compression Therapy Details Patient Name: Amber, [name]. Date of Service: 04/11/2022 1:00 PM Medical Record Number: [id number] Patient Account Number: [id number] Date of Birth/Sex: 1946/07/04 (76 y.o. F) Treating RN: Levora Dredge Primary Care Terrez Ander: [name] Other Clinician: Referring Jonalyn Sedlak: [name] Treating Blandon Offerdahl/Extender: [name] in Treatment: 19 Compression Therapy Performed for Wound Assessment: NonWound Condition Lymphedema - Right Leg Performed By: Clinician Levora Dredge, RN Compression Type: Three Layer Post Procedure Diagnosis Same as Pre-procedure Electronic Signature(s) Signed: 04/11/2022 4:16:45 PM By: Levora Dredge Entered By: Levora Dredge on 04/11/2022 13:32:59 Gieger, [name] ([id number]) -------------------------------------------------------------------------------- Encounter Discharge Information Details Patient Name: [name], [name]. Date of Service: 04/11/2022 1:00 PM Medical Record Number: [id number] Patient Account Number: [id number] Date of Birth/Sex: [birth year] (76 y.o. F) Treating RN: Levora Dredge Primary Care Revecca Nachtigal: [name] Other Clinician: Referring Tandra Rosado: [name] Treating Alizee Maple/Extender: [name] in Treatment: 19 Encounter Discharge Information Items Discharge Condition: Stable Ambulatory Status: Ambulatory Discharge Destination: Home Transportation: Private Auto Accompanied By: self Schedule Follow-up Appointment: Yes Clinical Summary of Care: Electronic Signature(s) Signed: 04/11/2022 4:16:45 PM By: Levora Dredge Entered By: Levora Dredge on 04/11/2022  13:44:55 Kaine, [name] ([id number]) -------------------------------------------------------------------------------- Lower Extremity Assessment Details Patient Name: [name], [name]. Date of Service: 04/11/2022 1:00 PM Medical Record Number: [id number] Patient Account Number: [id number] Date of Birth/Sex: 1945-11-29 (76 y.o. F) Treating RN: Levora Dredge Primary Care Lamiah Marmol: [name] Other Clinician: Referring Wane Mollett: [name] Treating Rifka Ramey/Extender: Jeri Cos Weeks in Treatment: 19 Edema Assessment Assessed: [Left: No] [Right: No] Edema: [Left: Ye] [Right: s] Calf Left: Right: Point of Measurement: 30 cm From Medial Instep 41.2 cm Ankle Left: Right: Point of Measurement: 13 cm From Medial Instep 23 cm Vascular Assessment Pulses: Dorsalis Pedis Palpable: [Right:Yes] Electronic Signature(s) Signed: 04/11/2022 4:16:45 PM By: Levora Dredge Entered By: Levora Dredge on 04/11/2022 13:12:57 Corbello, [name] ([id number]) -------------------------------------------------------------------------------- Multi Wound Chart Details Patient Name: PREZLEY, QADIR. Date of Service: 04/11/2022 1:00 PM Medical Record Number: [id number] Patient Account Number: [id number] Date of Birth/Sex: 1946-08-25 (76 y.o. F) Treating RN: Levora Dredge Primary Care Tynasia Mccaul: [name] Other Clinician: Referring Dajanae Brophy: [name] Treating Jeanine Caven/Extender: [name] in Treatment: 19 Vital Signs Height(in): 62 Pulse(bpm): 83 Weight(lbs): 199 Blood Pressure(mmHg): 154/80 Body Mass Index(BMI): 36.4 Temperature(F): 98.1 Respiratory Rate(breaths/min): 18 Photos: [N/A:N/A] Wound Location: Right, Medial Lower Leg N/A N/A Wounding Event: Gradually Appeared N/A N/A Primary Etiology: Lymphedema N/A N/A Secondary Etiology: Venous Leg Ulcer N/A N/A Comorbid History: Cataracts, Asthma, Sleep Apnea, N/A N/A Deep Vein Thrombosis, Hypertension, Peripheral  Venous Disease, Osteoarthritis, Received Chemotherapy, Received Radiation Date Acquired: 10/26/2021 N/A N/A Weeks of Treatment: 15 N/A N/A Wound Status: Open N/A N/A Wound Recurrence: No N/A N/A Measurements L x W x D (cm) 0.1x0.1x0.1 N/A N/A Area (cm) : 0.008 N/A N/A Volume (cm) : 0.001 N/A N/A % Reduction in Area: 99.50% N/A N/A % Reduction in Volume: 99.40% N/A N/A Classification: Full Thickness Without Exposed N/A N/A Support Structures Exudate Amount: Medium N/A N/A Exudate Type: Serosanguineous N/A N/A Exudate Color: red, brown N/A N/A Granulation Amount: None Present (0%) N/A N/A Necrotic Amount: Large (67-100%) N/A N/A Exposed Structures: Fascia: No N/A N/A Fat Layer (Subcutaneous Tissue): No Tendon: No Muscle: No Joint: No Bone: No Epithelialization: Large (67-100%) N/A N/A Treatment Notes Electronic Signature(s) Signed: 04/11/2022 4:16:45 PM By: Levora Dredge Entered By: Levora Dredge on 04/11/2022 13:13:53 KIMBERLI, WINNE ([id number]) Reisen, Fredna J. ([id number]) -------------------------------------------------------------------------------- Multi-Disciplinary  Care Plan Details Patient Name: EKNOOR, NOVACK. Date of Service: 04/11/2022 1:00 PM Medical Record Number: [id number] Patient Account Number: [id number] Date of Birth/Sex: 12-10-1945 (76 y.o. F) Treating RN: Levora Dredge Primary Care Athina Fahey: [name] Other Clinician: Referring Denyse Fillion: [name] Treating Corena Tilson/Extender: [name] in Treatment: 19 Active Inactive Wound/Skin Impairment Nursing Diagnoses: Impaired tissue integrity Knowledge deficit related to smoking impact on wound healing Knowledge deficit related to ulceration/compromised skin integrity Goals: Patient/caregiver will verbalize understanding of skin care regimen Date Initiated: 11/27/2021 Date Inactivated: 01/15/2022 Target Resolution Date: 12/25/2021 Goal Status: Met Ulcer/skin breakdown will  have a volume reduction of 30% by week 4 Date Initiated: 11/27/2021 Date Inactivated: 03/08/2022 Target Resolution Date: 12/25/2021 Goal Status: Met Ulcer/skin breakdown will have a volume reduction of 50% by week 8 Date Initiated: 11/27/2021 Date Inactivated: 03/08/2022 Target Resolution Date: 01/22/2022 Goal Status: Met Ulcer/skin breakdown will have a volume reduction of 80% by week 12 Date Initiated: 11/27/2021 Target Resolution Date: 02/22/2022 Goal Status: Active Ulcer/skin breakdown will heal within 14 weeks Date Initiated: 11/27/2021 Target Resolution Date: 03/08/2022 Goal Status: Active Interventions: Assess ulceration(s) every visit Provide education on ulcer and skin care Treatment Activities: Skin care regimen initiated : 11/27/2021 Topical wound management initiated : 11/27/2021 Notes: Electronic Signature(s) Signed: 04/11/2022 4:16:45 PM By: Levora Dredge Entered By: Levora Dredge on 04/11/2022 13:13:44 Weniger, [name] ([id number]) -------------------------------------------------------------------------------- Pain Assessment Details Patient Name: [name], KUEKER. Date of Service: 04/11/2022 1:00 PM Medical Record Number: [id number] Patient Account Number: [id number] Date of Birth/Sex: Aug 08, 1946 (76 y.o. F) Treating RN: Levora Dredge Primary Care Norvell Ureste: [name] Other Clinician: Referring Tauriel Scronce: [name] Treating Nickole Adamek/Extender: [name] in Treatment: 19 Active Problems Location of Pain Severity and Description of Pain Patient Has Paino No Site Locations Rate the pain. Current Pain Level: 0 Pain Management and Medication Current Pain Management: Notes pt states burning at times Electronic Signature(s) Signed: 04/11/2022 4:16:45 PM By: Levora Dredge Entered By: Levora Dredge on 04/11/2022 13:04:51 Washam, [name] ([id number]) -------------------------------------------------------------------------------- Patient/Caregiver  Education Details Patient Name: MAILYN, STEICHEN. Date of Service: 04/11/2022 1:00 PM Medical Record Number: [id number] Patient Account Number: [id number] Date of Birth/Gender: [birth year] (76 y.o. F) Treating RN: Levora Dredge Primary Care Physician: [name] Other Clinician: Referring Physician: [name] Treating Physician/Extender: [name] in Treatment: 19 Education Assessment Education Provided To: Patient Education Topics Provided Wound/Skin Impairment: Handouts: Caring for Your Ulcer Methods: Explain/Verbal Responses: State content correctly Electronic Signature(s) Signed: 04/11/2022 4:16:45 PM By: Levora Dredge Entered By: Levora Dredge on 04/11/2022 13:44:24 Summerall, [name] ([id number]) -------------------------------------------------------------------------------- Wound Assessment Details Patient Name: [name], [name]. Date of Service: 04/11/2022 1:00 PM Medical Record Number: [id number] Patient Account Number: [id number] Date of Birth/Sex: Dec 17, 1945 (76 y.o. F) Treating RN: Levora Dredge Primary Care Travanti Mcmanus: [name] Other Clinician: Referring Paquita Printy: [name] Treating Rueben Kassim/Extender: [name] in Treatment: 19 Wound Status Wound Number: 16 Primary Lymphedema Etiology: Wound Location: Right, Medial Lower Leg Secondary Venous Leg Ulcer Wounding Event: Gradually Appeared Etiology: Date Acquired: 10/26/2021 Wound Open Weeks Of Treatment: 15 Status: Clustered Wound: No Comorbid Cataracts, Asthma, Sleep Apnea, Deep Vein Thrombosis, History: Hypertension, Peripheral Venous Disease, Osteoarthritis, Received Chemotherapy, Received Radiation Photos Wound Measurements Length: (cm) 0.1 Width: (cm) 0.1 Depth: (cm) 0.1 Area: (cm) 0.008 Volume: (cm) 0.001 % Reduction in Area: 99.5% % Reduction in Volume: 99.4% Epithelialization: Large (67-100%) Tunneling: No Undermining: No Wound  Description Classification: Full Thickness Without Exposed Support Structu Exudate Amount: Medium Exudate Type: Serosanguineous Exudate  Color: red, brown res Foul Odor After Cleansing: No Slough/Fibrino Yes Wound Bed Granulation Amount: None Present (0%) Exposed Structure Necrotic Amount: Large (67-100%) Fascia Exposed: No Necrotic Quality: Adherent Slough Fat Layer (Subcutaneous Tissue) Exposed: No Tendon Exposed: No Muscle Exposed: No Joint Exposed: No Bone Exposed: No Treatment Notes Wound #16 (Lower Leg) Wound Laterality: Right, Medial Cleanser Soap and Water Discharge Instruction: Gently cleanse wound with antibacterial soap, rinse and pat dry prior to dressing wounds PRETTY, WELTMAN ([id number]) Wound Cleanser Discharge Instruction: Wash your hands with soap and water. Remove old dressing, discard into plastic bag and place into trash. Cleanse the wound with Wound Cleanser prior to applying a clean dressing using gauze sponges, not tissues or cotton balls. Do not scrub or use excessive force. Pat dry using gauze sponges, not tissue or cotton balls. Peri-Wound Care Topical Primary Dressing Xeroform-HBD 2x2 (in/in) Discharge Instruction: Apply Xeroform-HBD 2x2 (in/in) as directed Secondary Dressing Gauze Secured With Compression Wrap 3-LAYER WRAP - Profore Lite LF 3 Multilayer Compression Bandaging System Discharge Instruction: Apply 3 multi-layer wrap as prescribed. Compression Stockings Add-Ons Electronic Signature(s) Signed: 04/11/2022 4:16:45 PM By: Levora Dredge Entered By: Levora Dredge on 04/11/2022 13:12:29 JODEE, WAGENAAR ([id number]) -------------------------------------------------------------------------------- Vitals Details Patient Name: [name], [name]. Date of Service: 04/11/2022 1:00 PM Medical Record Number: [id number] Patient Account Number: [id number] Date of Birth/Sex: 01/28/1946 (76 y.o. F) Treating RN: Levora Dredge Primary Care Ravyn Nikkel:  [name] Other Clinician: Referring Marijane Trower: [name] Treating Shelise Maron/Extender: [name] in Treatment: 19 Vital Signs Time Taken: 13:03 Temperature (F): 98.1 Height (in): 62 Pulse (bpm): 83 Weight (lbs): 199 Respiratory Rate (breaths/min): 18 Body Mass Index (BMI): 36.4 Blood Pressure (mmHg): 154/80 Reference Range: 80 - 120 mg / dl Electronic Signature(s) Signed: 04/11/2022 4:16:45 PM By: Levora Dredge Entered By: Levora Dredge on 04/11/2022 13:04:38

## 2022-04-12 ENCOUNTER — Ambulatory Visit: Payer: Medicare Other | Admitting: Physician Assistant

## 2022-04-17 ENCOUNTER — Ambulatory Visit: Payer: Medicare Other | Admitting: Family Medicine

## 2022-04-19 ENCOUNTER — Encounter: Payer: Medicare Other | Admitting: Physician Assistant

## 2022-04-19 DIAGNOSIS — I87331 Chronic venous hypertension (idiopathic) with ulcer and inflammation of right lower extremity: Secondary | ICD-10-CM | POA: Diagnosis not present

## 2022-04-19 DIAGNOSIS — I10 Essential (primary) hypertension: Secondary | ICD-10-CM | POA: Diagnosis not present

## 2022-04-19 DIAGNOSIS — L97811 Non-pressure chronic ulcer of other part of right lower leg limited to breakdown of skin: Secondary | ICD-10-CM | POA: Diagnosis not present

## 2022-04-19 DIAGNOSIS — I872 Venous insufficiency (chronic) (peripheral): Secondary | ICD-10-CM | POA: Diagnosis not present

## 2022-04-19 DIAGNOSIS — G4733 Obstructive sleep apnea (adult) (pediatric): Secondary | ICD-10-CM | POA: Diagnosis not present

## 2022-04-19 DIAGNOSIS — I890 Lymphedema, not elsewhere classified: Secondary | ICD-10-CM | POA: Diagnosis not present

## 2022-04-19 NOTE — Progress Notes (Signed)
[name], [name] ([id number]) Visit Report for 04/19/2022 Arrival Information Details Patient Name: [name], [name]. Date of Service: 04/19/2022 1:00 PM Medical Record Number: [id number] Patient Account Number: [id number] Date of Birth/Sex: 1946/01/30 (76 y.o. F) Treating RN: [name] Primary Care [name]: [name] Other Clinician: Referring [name]: [name] Treating [name]/Extender: [name] in Treatment: 20 Visit Information History Since Last Visit Any new allergies or adverse reactions: No Patient Arrived: Ambulatory Had a fall or experienced change in No Arrival Time: 12:58 activities of daily living that may affect Accompanied By: self risk of falls: Transfer Assistance: None Hospitalized since last visit: No Patient Identification Verified: Yes Has Dressing in Place as Prescribed: Yes Secondary Verification Process Completed: Yes Has Compression in Place as Prescribed: Yes Patient Requires Transmission-Based No Pain Present Now: No Precautions: Patient Has Alerts: Yes Patient Alerts: Patient on Blood Thinner '81mg'$  aspirin [place] vascular ABI Electronic Signature(s) Signed: 04/19/2022 3:08:30 PM By: [name] Entered By: [name] on 04/19/2022 12:58:45 [name], [name] ([id number]) -------------------------------------------------------------------------------- Clinic Level of Care Assessment Details Patient Name: [name]. Date of Service: 04/19/2022 1:00 PM Medical Record Number: [id number] Patient Account Number: [id number] Date of Birth/Sex: April 22, 1946 (76 y.o. F) Treating RN: [name] Primary Care [name]: [name] Other Clinician: Referring [name]: [name] Treating [name]/Extender: [name] in Treatment: 20 Clinic Level of Care Assessment Items TOOL 4 Quantity Score '[]'$  - Use when only an EandM is performed on FOLLOW-UP visit 0 ASSESSMENTS - Nursing Assessment /  Reassessment X - Reassessment of Co-morbidities (includes updates in patient status) 1 10 X- 1 5 Reassessment of Adherence to Treatment Plan ASSESSMENTS - Wound and Skin Assessment / Reassessment X - Simple Wound Assessment / Reassessment - one wound 1 5 '[]'$  - 0 Complex Wound Assessment / Reassessment - multiple wounds '[]'$  - 0 Dermatologic / Skin Assessment (not related to wound area) ASSESSMENTS - Focused Assessment X - Circumferential Edema Measurements - multi extremities 1 5 '[]'$  - 0 Nutritional Assessment / Counseling / Intervention '[]'$  - 0 Lower Extremity Assessment (monofilament, tuning fork, pulses) '[]'$  - 0 Peripheral Arterial Disease Assessment (using hand held doppler) ASSESSMENTS - Ostomy and/or Continence Assessment and Care '[]'$  - Incontinence Assessment and Management 0 '[]'$  - 0 Ostomy Care Assessment and Management (repouching, etc.) PROCESS - Coordination of Care X - Simple Patient / Family Education for ongoing care 1 15 '[]'$  - 0 Complex (extensive) Patient / Family Education for ongoing care '[]'$  - 0 Staff obtains Programmer, systems, Records, Test Results / Process Orders '[]'$  - 0 Staff telephones HHA, Nursing Homes / Clarify orders / etc '[]'$  - 0 Routine Transfer to another Facility (non-emergent condition) '[]'$  - 0 Routine Hospital Admission (non-emergent condition) '[]'$  - 0 New Admissions / Biomedical engineer / Ordering NPWT, Apligraf, etc. '[]'$  - 0 Emergency Hospital Admission (emergent condition) X- 1 10 Simple Discharge Coordination '[]'$  - 0 Complex (extensive) Discharge Coordination PROCESS - Special Needs '[]'$  - Pediatric / Minor Patient Management 0 '[]'$  - 0 Isolation Patient Management '[]'$  - 0 Hearing / Language / Visual special needs '[]'$  - 0 Assessment of Community assistance (transportation, D/C planning, etc.) '[]'$  - 0 Additional assistance / Altered mentation '[]'$  - 0 Support Surface(s) Assessment (bed, cushion, seat, etc.) INTERVENTIONS - Wound Cleansing /  Measurement [name], [name]. ([id number]) X- 1 5 Simple Wound Cleansing - one wound '[]'$  - 0 Complex Wound Cleansing - multiple wounds X- 1 5 Wound Imaging (photographs - any number of wounds) '[]'$  - 0  Wound Tracing (instead of photographs) X- 1 5 Simple Wound Measurement - one wound '[]'$  - 0 Complex Wound Measurement - multiple wounds INTERVENTIONS - Wound Dressings '[]'$  - Small Wound Dressing one or multiple wounds 0 '[]'$  - 0 Medium Wound Dressing one or multiple wounds '[]'$  - 0 Large Wound Dressing one or multiple wounds '[]'$  - 0 Application of Medications - topical '[]'$  - 0 Application of Medications - injection INTERVENTIONS - Miscellaneous '[]'$  - External ear exam 0 '[]'$  - 0 Specimen Collection (cultures, biopsies, blood, body fluids, etc.) '[]'$  - 0 Specimen(s) / Culture(s) sent or taken to Lab for analysis '[]'$  - 0 Patient Transfer (multiple staff / Civil Service fast streamer / Similar devices) '[]'$  - 0 Simple Staple / Suture removal (25 or less) '[]'$  - 0 Complex Staple / Suture removal (26 or more) '[]'$  - 0 Hypo / Hyperglycemic Management (close monitor of Blood Glucose) '[]'$  - 0 Ankle / Brachial Index (ABI) - do not check if billed separately X- 1 5 Vital Signs Has the patient been seen at the hospital within the last three years: Yes Total Score: 70 Level Of Care: New/Established - Level 2 Electronic Signature(s) Signed: 04/19/2022 3:08:30 PM By: [name] Entered By: [name] on 04/19/2022 13:34:03 [name], [name] ([id number]) -------------------------------------------------------------------------------- Encounter Discharge Information Details Patient Name: [name]. Date of Service: 04/19/2022 1:00 PM Medical Record Number: [id number] Patient Account Number: [id number] Date of Birth/Sex: 1946-09-07 (76 y.o. F) Treating RN: [name] Primary Care [name]: [name] Other Clinician: Referring [name]: [name] Treating [name]/Extender: [name] in Treatment: 20 Encounter Discharge Information Items Discharge Condition: Stable Ambulatory Status: Ambulatory Discharge Destination: Home Transportation: Private Auto Accompanied By: self Schedule Follow-up Appointment: No Clinical Summary of Care: Electronic Signature(s) Signed: 04/19/2022 3:08:30 PM By: [name] Entered By: [name] on 04/19/2022 13:37:24 [name], [name] ([id number]) -------------------------------------------------------------------------------- Lower Extremity Assessment Details Patient Name: SUMAIYA, ARRUDA. Date of Service: 04/19/2022 1:00 PM Medical Record Number: [id number] Patient Account Number: [id number] Date of Birth/Sex: [birth year] (76 y.o. F) Treating RN: [name] Primary Care Jerel Sardina: [name] Other Clinician: Referring Sarayah Bacchi: [name] Treating Lucia Mccreadie/Extender: Jeri Cos Weeks in Treatment: 20 Edema Assessment Assessed: [Left: No] [Right: No] Edema: [Left: Ye] [Right: s] Calf Left: Right: Point of Measurement: 30 cm From Medial Instep 39.4 cm Ankle Left: Right: Point of Measurement: 13 cm From Medial Instep 22.2 cm Vascular Assessment Pulses: Dorsalis Pedis Palpable: [Right:Yes] Electronic Signature(s) Signed: 04/19/2022 3:08:30 PM By: [name] Entered By: [name] on 04/19/2022 13:08:32 NATALIN, BIBLE ([id number]) -------------------------------------------------------------------------------- Multi Wound Chart Details Patient Name: JANESE, RADABAUGH. Date of Service: 04/19/2022 1:00 PM Medical Record Number: [id number] Patient Account Number: [id number] Date of Birth/Sex: 03/21/1946 (76 y.o. F) Treating RN: [name] Primary Care Uno Esau: [name] Other Clinician: Referring Bernon Arviso: [name] Treating Jamarquis Crull/Extender: [name] in Treatment: 20 Vital Signs Height(in): 50 Pulse(bpm): 78 Weight(lbs): 199 Blood Pressure(mmHg):  145/78 Body Mass Index(BMI): 36.4 Temperature(F): 97.7 Respiratory Rate(breaths/min): 18 Photos: [N/A:N/A] Wound Location: Right, Medial Lower Leg N/A N/A Wounding Event: Gradually Appeared N/A N/A Primary Etiology: Lymphedema N/A N/A Secondary Etiology: Venous Leg Ulcer N/A N/A Comorbid History: Cataracts, Asthma, Sleep Apnea, N/A N/A Deep Vein Thrombosis, Hypertension, Peripheral Venous Disease, Osteoarthritis, Received Chemotherapy, Received Radiation Date Acquired: 10/26/2021 N/A N/A Weeks of Treatment: 16 N/A N/A Wound Status: Open N/A N/A Wound Recurrence: No N/A N/A Measurements L x W x D (cm) 0.1x0.1x0.1 N/A N/A Area (cm) : 0.008 N/A N/A Volume (cm) :  0.001 N/A N/A % Reduction in Area: 99.50% N/A N/A % Reduction in Volume: 99.40% N/A N/A Classification: Full Thickness Without Exposed N/A N/A Support Structures Exudate Amount: Medium N/A N/A Exudate Type: Serosanguineous N/A N/A Exudate Color: red, brown N/A N/A Granulation Amount: None Present (0%) N/A N/A Necrotic Amount: None Present (0%) N/A N/A Exposed Structures: Fascia: No N/A N/A Fat Layer (Subcutaneous Tissue): No Tendon: No Muscle: No Joint: No Bone: No Epithelialization: Large (67-100%) N/A N/A Treatment Notes Electronic Signature(s) Signed: 04/19/2022 3:08:30 PM By: [name] Entered By: [name] on 04/19/2022 13:30:30 [name], [name] ([id number]) [place], [name] ([id number]) -------------------------------------------------------------------------------- Multi-Disciplinary Care Plan Details Patient Name: RAFAELLA, KOLE. Date of Service: 04/19/2022 1:00 PM Medical Record Number: [id number] Patient Account Number: [id number] Date of Birth/Sex: 11-17-1945 (76 y.o. F) Treating RN: [name] Primary Care Sheyanne Munley: [name] Other Clinician: Referring Mychal Decarlo: [name] Treating Gabby Rackers/Extender: Jeri Cos Weeks in Treatment: 20 Active Inactive Electronic  Signature(s) Signed: 04/19/2022 1:57:00 PM By: [name] Entered By: [name] on 04/19/2022 13:57:00 [name], [name] ([id number]) -------------------------------------------------------------------------------- Pain Assessment Details Patient Name: KIMBERLY, [name]. Date of Service: 04/19/2022 1:00 PM Medical Record Number: [id number] Patient Account Number: [id number] Date of Birth/Sex: Sep 08, 1946 (76 y.o. F) Treating RN: [name] Primary Care Beckie Viscardi: [name] Other Clinician: Referring Johara Lodwick: [name] Treating Shaila Gilchrest/Extender: [name] in Treatment: 20 Active Problems Location of Pain Severity and Description of Pain Patient Has Paino No Site Locations Rate the pain. Current Pain Level: 0 Pain Management and Medication Current Pain Management: Electronic Signature(s) Signed: 04/19/2022 3:08:30 PM By: [name] Entered By: [name] on 04/19/2022 13:00:14 DYNESHIA, BACCAM ([id number]) -------------------------------------------------------------------------------- Patient/Caregiver Education Details Patient Name: [name], [name]. Date of Service: 04/19/2022 1:00 PM Medical Record Number: [id number] Patient Account Number: [id number] Date of Birth/Gender: 1946/09/30 (76 y.o. F) Treating RN: [name] Primary Care Physician: [name] Other Clinician: Referring Physician: Ria [name] Treating Physician/Extender: [name] in Treatment: 20 Education Assessment Education Provided To: Patient Education Topics Provided Wound/Skin Impairment: Handouts: Other: care of healed wound Methods: Explain/Verbal Responses: State content correctly Electronic Signature(s) Signed: 04/19/2022 3:08:30 PM By: [name] Entered By: [name] on 04/19/2022 13:34:26 Chanda, [name] ([id number]) -------------------------------------------------------------------------------- Wound Assessment  Details Patient Name: [name], [name]. Date of Service: 04/19/2022 1:00 PM Medical Record Number: [id number] Patient Account Number: [id number] Date of Birth/Sex: [birth year] (76 y.o. F) Treating RN: [name] Primary Care Devanshi Califf: [name] Other Clinician: Referring Giani Betzold: [name] Treating Chenita Ruda/Extender: Jeri Cos Weeks in Treatment: 20 Wound Status Wound Number: 16 Primary Lymphedema Etiology: Wound Location: Right, Medial Lower Leg Secondary Venous Leg Ulcer Wounding Event: Gradually Appeared Etiology: Date Acquired: 10/26/2021 Wound Healed - Epithelialized Weeks Of Treatment: 16 Status: Clustered Wound: No Comorbid Cataracts, Asthma, Sleep Apnea, Deep Vein Thrombosis, History: Hypertension, Peripheral Venous Disease, Osteoarthritis, Received Chemotherapy, Received Radiation Photos Wound Measurements Length: (cm) 0 Width: (cm) 0 Depth: (cm) 0 Area: (cm) Volume: (cm) % Reduction in Area: 100% % Reduction in Volume: 100% Epithelialization: Large (67-100%) 0 Tunneling: No 0 Undermining: No Wound Description Classification: Full Thickness Without Exposed Support Structu Exudate Amount: Medium Exudate Type: Serosanguineous Exudate Color: red, brown res Foul Odor After Cleansing: No Slough/Fibrino Yes Wound Bed Granulation Amount: None Present (0%) Exposed Structure Necrotic Amount: None Present (0%) Fascia Exposed: No Fat Layer (Subcutaneous Tissue) Exposed: No Tendon Exposed: No Muscle Exposed: No Joint Exposed: No Bone Exposed: No Treatment Notes Wound #16 (Lower Leg) Wound Laterality: Right, Medial Cleanser  Peri-Wound Care [name], [name] ([id number]) Topical Primary Dressing Secondary Dressing Secured With Compression Wrap Compression Stockings Add-Ons Electronic Signature(s) Signed: 04/19/2022 3:08:30 PM By: [name] Entered By: [name] on 04/19/2022 13:32:42 [name], [name]  ([id number]) -------------------------------------------------------------------------------- Vitals Details Patient Name: [name], [name]. Date of Service: 04/19/2022 1:00 PM Medical Record Number: [id number] Patient Account Number: [id number] Date of Birth/Sex: [birth year] (76 y.o. F) Treating RN: [name] Primary Care Austen Wygant: [name] Other Clinician: Referring Angell Pincock: [name] Treating Vernita Tague/Extender: [name] in Treatment: 20 Vital Signs Time Taken: 12:58 Temperature (F): 97.7 Height (in): 62 Pulse (bpm): 71 Weight (lbs): 199 Respiratory Rate (breaths/min): 18 Body Mass Index (BMI): 36.4 Blood Pressure (mmHg): 145/78 Reference Range: 80 - 120 mg / dl Electronic Signature(s) Signed: 04/19/2022 3:08:30 PM By: [name] Entered By: [name] on 04/19/2022 13:00:07

## 2022-04-19 NOTE — Progress Notes (Addendum)
SHAQUASHA, GERSTEL ([id number]) Visit Report for 04/19/2022 Chief Complaint Document Details Patient Name: [name], [name]. Date of Service: 04/19/2022 1:00 PM Medical Record Number: [id number] Patient Account Number: [id number] Date of Birth/Sex: 05/20/1946 (76 y.o. F) Treating RN: Levora Dredge Primary Care Provider: [name] Other Clinician: Referring Provider: [name] Treating Provider/Extender: [name] in Treatment: 20 Information Obtained from: Patient Chief Complaint Right LE Ulcer Electronic Signature(s) Signed: 04/19/2022 1:01:08 PM By: Worthy Keeler PA-C Entered By: Worthy Keeler on 04/19/2022 13:01:07 [name], [name] ([id number]) -------------------------------------------------------------------------------- HPI Details Patient Name: [name], [name]. Date of Service: 04/19/2022 1:00 PM Medical Record Number: [id number] Patient Account Number: [id number] Date of Birth/Sex: [name] 01, 1946 (76 y.o. F) Treating RN: Levora Dredge Primary Care Provider: [name] Other Clinician: Referring Provider: [name] Treating Provider/Extender: [name] in Treatment: 20 History of Present Illness HPI Description: Pleasant 76 year old with history of chronic venous insufficiency. No diabetes or peripheral vascular disease. Left ABI 1.29. Questionable history of left lower extremity DVT. She developed a recurrent ulceration on her left lateral calf in December 2015, which she attributes to poor diet and subsequent lower extremity edema. She underwent endovenous laser ablation of her left greater saphenous vein in 2010. She underwent laser ablation of accessory branch of left GSV in April 2016 by Dr. Kellie Simmering at [facility]. She was previously wearing Unna boots, which she tolerated well. Tolerating 2 layer compression and cadexomer iodine. She returns to clinic for follow-up and is without new complaints. She denies any significant pain at  this time. She reports persistent pain with pressure. No claudication or ischemic rest pain. No fever or chills. No drainage. READMISSION 11/13/16; this is a 76 year old woman who is not a diabetic. She is here for a review of a painful area on her left medial lower extremity. I note that she was seen here previously last year for wound I believe to be in the same area. At that time she had undergone previously a left greater saphenous vein ablation by Dr. Kellie Simmering and she had a ablation of the anterior accessory branch of the left greater saphenous vein in March 2016. Seeing that the wound actually closed over. In reviewing the history with her today the ulcer in this area has been recurrent. She describes a biopsy of this area in 2009 that only showed stasis physiology. She also has a history of today malignant melanoma in the right shoulder for which she follows with Dr. 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 Do not see that she has ever had arterial studies in the left leg. She tells me she has a follow-up with Dr. Kellie Simmering in roughly 10 days In any case she developed the reopening of this area roughly a month ago. On the background of this she describes rapidly increasing edema which has responded to Lasix 40 mg and metolazone 2.5 mg as well as the patient's lymph massage. She has been told she has both venous insufficiency and lymphedema but she cannot tolerate compression stockings 11/28/16; the patient saw Dr. Kellie Simmering recently. Per the patient he did arterial Dopplers in the office that did not show evidence of arterial insufficiency, per the patient he stated "treat this like an ordinary venous ulcer". She also saw her dermatologist Dr. Ronnald Ramp who felt that this was more of a vascular ulcer. In general things are improving although she  arrives today with increasing bilateral lower  extremity edema with weeping a deeper fluid through the wound on the left medial leg compatible with some degree of lymphedema 12/04/16; the patient's wound is fully epithelialized but I don't think fully healed. We will do another week of depression with Promogran and TCA however I suspect we'll be able to discharge her next week. This is a very unusual-looking wound which was initially a figure-of-eight type wound lying on its side surrounded by petechial like hemorrhage. She has had venous ablation on this side. She apparently does not have an arterial issue per Dr. Kellie Simmering. She saw her dermatologist thought it was "vascular". Patient is definitely going to need ongoing compression and I talked about this with her today she will go to elastic therapy after she leaves here next week 12/11/16; the patient's wound is not completely closed today. She has surrounding scar tissue and in further discussion with the patient it would appear that she had ulcers in this area in 2009 for a prolonged period of time ultimately requiring a punch biopsy of this area that only showed venous insufficiency. I did not previously pickup on this part of the history from the patient. 12/18/16; the patient's wound is completely epithelialized. There is no open area here. She has significant bilateral venous insufficiency with secondary lymphedema to a mild-to-moderate degree she does not have compression stockings.. She did not say anything to me when I was in the room, she told our intake nurse that she was still having pain in this area. This isn't unusual recurrent small open area. She is going to go to elastic therapy to obtain compression stockings. 12/25/16; the patient's wound is fully epithelialized. There is no open area here. The patient describes some continued episodic discomfort in this area medial left calf. However everything looks fine and healed here. She is been to elastic therapy and caught herself 15-20 mmHg  stockings, they apparently were having trouble getting 20-30 mm stockings in her size 01/22/17; this is a patient we discharged from the clinic a month ago. She has a recurrent open wound on her medial left calf. She had 15 mm support stockings. I told her I thought she needed 20-30 mm compression stockings. She tells me that she has been ill with hospitalization secondary to asthma and is been found to have severe hypokalemia likely secondary to a combination of Lasix and metolazone. This morning she noted blistering and leaking fluid on the posterior part of her left leg. She called our intake nurse urgently and we was saw her this afternoon. She has not had any real discomfort here. I don't know that she's been wearing any stockings on this leg for at least 2-3 days. ABIs in this clinic were 1.21 on the right and 1.3 on the left. She is previously seen vascular surgery who does not think that there is a peripheral arterial issue. 01/30/17; Patient arrives with no open wound on the left leg. She has been to elastic therapy and obtained 20-19mhg below knee stockings and she has one on the right leg today. READMISSION 02/19/18; this PHowseris a now 76year old patient we've had in this clinic perhaps 3 times before. I had last looked at her from January 07 December 2016 with an area on the medial left leg. We discharged her on 12/25/16 however she had to be readmitted on 01/22/17 with a recurrence. I have in my notes that we discharged her on 20-30 mm stockings although she tells me  she was only wearing support hose because she cannot get stockings on predominantly related to her cervical spine surgery/issues. She has had previous ablations done by vein and vascular in [place] including a great saphenous vein ablation on the left with an anterior accessory branch ablation I think both of these were in 2016. On one of the previous visit she had a biopsy noted 2009 that was negative. She is not felt to  have an arterial issue. She is not a diabetic. She does have a history of obstructive sleep apnea hypertension asthma as well as chronic venous insufficiency and lymphedema. On this occasion she noted 2 dry scaly patch on her left leg. She tried to put lotion on this it didn't really help. There were 2 open areas.the patient has been seeing her primary physician from 02/05/18 through 02/14/18. She had Unna boots applied. The superior wound now on the lateral left leg has closed but she's had one wound that remains open on the lateral left leg. This is not the same spot as we dealt with in 2018. ABIs in this clinic were 1.3 bilaterally CHARMAN, [name] ([id number]) 02/26/18; patient has a small wound on the left lateral calf. Dimensions are down. She has chronic venous insufficiency and lymphedema. 03/05/18; small open area on the left lateral calf. Dimensions are down. Tightly adherent necrotic debris over the surface of the wound which was difficult to remove. Also the dressing [over collagen] stuck to the wound surface. This was removed with some difficulty as well. Change the primary dressing to Hydrofera Blue ready 03/12/18; small open area on the left lateral calf. Comes in with tightly adherent surface eschar as well as some adherent Hydrofera Blue. 03/19/18; open area on the left lateral calf. Again adherent surface eschar as well as some adherent Hydrofera Blue nonviable subcutaneous tissue. She complained of pain all week even with the reduction from 4-3 layer compression I put on last week. Also she had an increase in her ankle and calf measurements probably related to the same thing. 03/26/18; open area on the left lateral calf. A very small open area remains here. We used silver alginate starting last week as the Hydrofera Blue seem to stick to the wound bed. In using 4-layer compression 04/02/18; the open area in the left lateral calf at some adherent slough which I removed there is no open area  here. We are able to transition her into her own compression stocking. Truthfully I think this is probably his support hose. However this does not maintain skin integrity will be limited. She cannot put over the toe compression stockings on because of neck problems hand problems etc. She is allergic to the lining layer of juxta lites. We might be forced to use extremitease stocking should this fail READMIT 11/24/2018 Patient is now a 76 year old woman who is not a diabetic. She has been in this clinic on at least 3 previous occasions largely with recurrent wounds on her left leg secondary to chronic venous insufficiency with secondary lymphedema. Her situation is complicated by inability to get stockings on and an allergy to neoprene which is apparently a component and at least juxta lites and other stockings. As a result she really has not been wearing any stockings on her legs. She tells [place] that roughly 2 or 3 weeks ago she started noticing a stinging sensation just above her ankle on the left medial aspect. She has been diagnosed with pseudogout and she wondered whether this was what she was  experiencing. She tried to dress this with something she bought at the store however subsequently it pulled skin off and now she has an open wound that is not improving. She has been using Vaseline gauze with a cover bandage. She saw her primary doctor last week who put an Haematologist on her. ABIs in this clinic was 1.03 on the left 2/12; the area is on the left medial ankle. Odd-looking wound with what looks to be surface epithelialization but a multitude of small petechial openings. This clearly not closed yet. We have been using silver alginate under 3 layer compression with TCA 2/19; the wound area did not look quite as good this week. Necrotic debris over the majority of the wound surface which required debridement. She continues to have a multitude of what looked to be small petechial openings. She  reminds [place] that she had a biopsy on this initially during her first outbreak in 2015 in [place] dermatology. She expresses concern about this being a possible melanoma. She apparently had a nodular melanoma up on her shoulder that was treated with excision, lymph node removal and ultimately radiation. I assured her that this does not look anything like melanoma. Except for the petechial reaction it does look like a venous insufficiency area and she certainly has evidence of this on both sides 2/26; a difficult area on the left medial ankle. The patient clearly has chronic venous hypertension with some degree of lymphedema. The odd thing about the area is the small petechial hemorrhages. I am not really sure how to explain this. This was present last time and this is not a compression injury. We have been using Hydrofera Blue which I changed to last week 3/4; still using Hydrofera Blue. Aggressive debridement today. She does not have known arterial issues. She has seen Dr. Kellie Simmering at [facility] vein and vascular and and has an ablation on the left. [Anterior accessory branch of the greater saphenous]. From what I remember they did not feel she had an arterial issue. The patient has had this area biopsied in 2009 at [facility] dermatology and by her recollection they said this was "stasis". She is also follow-up with dermatology locally who thought that this was more of a vascular issue 3/11; using Hydrofera Blue. Aggressive debridement today. She does not have an arterial issue. We are using 3 layer compression although we may need to go to 4. The patient has been in for multiple changes to her wrap since I last saw her a week ago. She says that the area was leaking. I do not have too much more information on what was found 01/19/19 on evaluation today patient was actually being seen for a nurse visit when unfortunately she had the area on her left lateral lower extremity as well as weeping from the  right lower extremity that became apparent. Therefore we did end up actually seeing her for a full visit with myself. She is having some pain at this site as well but fortunately nothing too significant at this point. No fevers, chills, nausea, or vomiting noted at this time. 3/18-Patient is back to the clinic with the left leg venous leg ulcer, the ulcer is larger in size, has a surface that is densely adherent with fibrinous tissue, the Hydrofera Blue was used but is densely adherent and there was difficulty in removing it. The right lower extremity was also wrapped for weeping edema. Patient has a new area over the left lateral foot above the malleolus that is  small and appears to have no debris with intact surrounding skin. Patient is on increased dose of Lasix also as a means to edema management 3/25; the patient has a nonhealing venous ulcer on the medial left leg and last week developed a smaller area on the lateral left calf. We have been using Hydrofera Blue with a contact layer. 4/1; no major change in these wounds areas. Left medial and more recently left lateral calf. I tried Iodoflex last week to aid in debridement she did not tolerate this. She stated her pain was terrible all week. She took the top layer of the 4 layer compression off. 4/8; the patient actually looks somewhat better in terms of her more prominent left lateral calf wound. There is some healthy looking tissue here. She is still complaining of a lot of discomfort. 4/15; patient in a lot of pain secondary to sciatica. She is on a prednisone taper prescribed by her primary physician. She has the 2 areas one on the left medial and more recently a smaller area on the left lateral calf. Both of these just above the malleoli 4/22; her back pain is better but she still states she is very uncomfortable and now feels she is intolerant to the [employer]. No real change in the wounds we have been using Sorbact. She has been  previously intolerant to Iodoflex. There is not a lot of option about what we can use to debride this wound under compression that she no doubt needs. sHe states Ultram no longer works for her pain 4/29; no major change in the wounds slightly increased depth. Surface on the original medial wound perhaps somewhat improved however the more recent area on the lateral left ankle is 100% covered in very adherent debris we have been using Sorbact. She tolerates 4 layer compression well and her edema control is a lot better. She has not had to come in for a nurse check 5/6; no major change in the condition of the wounds. She did consent to debridement today which was done with some difficulty. Continuing Sorbact. She did not tolerate Iodoflex. She was in for a check of her compression the day after we wrapped her last week this was adjusted but nothing much was found 5/13; no major change in the condition or area of the wounds. I was able to get a fairly aggressive debridement done on the lateral left leg wound. Even using Sorbact under compression. She came back in on Friday to have the wrap changed. She says she felt uncomfortable on the lateral aspect of her ankle. She has a long history of chronic venous insufficiency including previous ablation surgery on this side. 5/20-Patient returns for wounds on left leg with both wounds covered in slough, with the lateral leg wound larger in size, she has been in 3 layer compression and felt more comfortable, she describes pain in ankle, in leg and pins and needles in foot, and is about to try Pamelor for this 6/3; wounds on the left lateral and left medial leg. The area medially which is the most recent of the 2 seems to have had the largest increase in [name], [name]. ([id number]) dimensions. We have been using Sorbac to try and debride the surface. She has been to see orthopedics they apparently did a plain x-ray that was indeterminant. Diagnosed her with  neuropathy and they have ordered an MRI to determine if there is underlying osteomyelitis. This was not high on my thought list but I suppose  it is prudent. We have advised her to make an appointment with vein and vascular in [place]. She has a history of a left greater saphenous and accessory vein ablations I wonder if there is anything else that can be done from a surgical point of view to help in these difficult refractory wounds. We have previously healed this wound on one occasion but it keeps on reopening [medial side] 6/10; deep tissue culture I did last week I think on the left medial wound showed both moderate E. coli and moderate staph aureus [MSSA]. She is going to require antibiotics and I have chosen Augmentin. We have been using Sorbact and we have made better looking wound surface on both sides but certainly no improvement in wound area. She was back in last Friday apparently for a dressing changes the wrap was hurting her outer left ankle. She has not managed to get a hold of vein and vascular in [place]. We are going to have to make her that appointment 6/17; patient is tolerating the Augmentin. She had an MRI that I think was ordered by orthopedic surgeon this did not show osteomyelitis or an abscess did suggest cellulitis. We have been using Sorbact to the lateral and medial ankles. We have been trying to arrange a follow-up appointment with vein and vascular in [place] or did her original ablations. We apparently an area sent the request to vein and vascular in [facility] 6/24; patient has completed the Augmentin. We do not yet have a vein and vascular appointment in [place]. I am not sure what the issue is here we have asked her to call tomorrow. We are using Sorbact. Making some improvements and especially the medial wound. Both surfaces however look better medial and lateral. 7/1; the patient has been in contact with vein and vascular in [place] but has not yet  received an appointment. Using Sorbact we have gradually improve the wound surface with no improvement in surface area. She is approved for Apligraf but the wound surface still is not completely viable. She has not had to come in for a dressing change 7/8; the patient has an appointment with vein and vascular on 7/31 which is a Friday afternoon. She is concerned about getting back here for [place] to dress her wounds. I think it is important to have them goal for her venous reflux/history of ablations etc. to see if anything else can be done. She apparently tested positive for 1 of the blood tests with regards to lupus and saw a rheumatologist. He has raised the issue of vasculitis again. I have had this thought in the past however the evidence seems overwhelming that this is a venous reflux etiology. If the rheumatologist tells me there is clinical and laboratory investigation is positive for lupus I will rethink this. 7/15; the patient's wound surfaces are quite a bit better. The medial area which was her original wound now has no depth although the lateral wound which was the more recent area actually appears larger. Both with viable surfaces which is indeed better. Using Sorbact. I wanted to use Apligraf on her however there is the issue of the vein and vascular appointment on 7/31 at 2:00 in the afternoon which would not allow her to get back to be rewrapped and they would no doubt remove the graft 7/22; the patient's wound surfaces have moderate amount of debris although generally look better. The lateral one is larger with 2 small satellite areas superiorly. We are waiting for her vein and  vascular appointment on 7/31. She has been approved for Apligraf which I would like to use after th 7/29; wound surfaces have improved no debridement is required we have been using Sorbact. She sees vein and vascular on Friday with this so question of whether anything can be done to lessen the likelihood of  recurrence and/or speed the healing of these areas. She is already had previous ablations. She no doubt has severe venous hypertension 8/5-Patient returns at 1 week, she was in [place] for 3 days by her podiatrist, we have been using so backed to the wound, she has increased pain in both the wounds on the left lower leg especially the more distal one on the lateral aspect 8/12-Patient returns at 1 week and she is agreeable to having debridement in both wounds on her left leg today. We have been using Sorbact, and vascular studies were reviewed at last visit 8/19; the patient arrives with her wounds fairly clean and no debridement is required. We have used Sorbact which is really done a nice job in cleaning up these very difficult wound surfaces. The patient saw Dr. Donzetta Matters of vascular surgery on 7/31. He did not feel that there was an arterial component. He felt that her treated greater saphenous vein is adequately addressed and that the small saphenous vein did not appear to be involved significantly. She was also noted to have deep venous reflux which is not treatable. Dr. Donzetta Matters mentioned the possibility of a central obstructive component leading to reflux and he offered her central venography. She wanted to discuss this or think about it. I have urged her to go ahead with this. She has had recurrent difficult wounds in these areas which do heal but after months in the clinic. If there is anything that can be done to reduce the likelihood of this I think it is worth it. 9/2 she is still working towards getting follow-up with Dr. Donzetta Matters to schedule her CT. Things are quite a bit worse venography. I put Apligraf on 2 weeks ago on both wounds on the medial and lateral part of her left lower leg. She arrives in clinic today with 3 superficial additional wounds above the area laterally and one below the wound medially. She describes a lot of discomfort. I think these are probably wrapped injuries. Does not  look like she has cellulitis. 07/20/2019 on evaluation today patient appears to be doing somewhat poorly in regard to her lower extremity ulcers. She in fact showed signs of erythema in fact we may even be dealing with an infection at this time. Unfortunately I am unsure if this is just infection or if indeed there may be some allergic reaction that occurred as a result of the Apligraf application. With that being said that would be unusual but nonetheless not impossible in this patient is one who is unfortunately allergic to quite a bit. Currently we have been using the Sorbact which seems to do as well as anything for her. I do think we may want to obtain a culture today to see if there is anything showing up there that may need to be addressed. 9/16; noted that last week the wounds look worse in 1 week follow-up of the Apligraf. Using Sorbact as of 2 days ago. She arrives with copious amounts of drainage and new skin breakdown on the back of the left calf. The wounds arm more substantial bilaterally. There is a fair amount of swelling in the left calf no overt DVT there  is edema present I think in the left greater than right thigh. She is supposed to go on 9/28 for CT venography. The wounds on the medial and lateral calf are worse and she has new skin breakdown posteriorly at least new for me. This is almost developing into a circumferential wound area The Apligraf was taken off last week which I agree with things are not going in the right direction a culture was done we do not have that back yet. She is on Augmentin that she started 2 days ago 9/23; dressing was changed by her nurses on Monday. In general there is no improvement in the wound areas although the area looks less angry than last week. She did get Augmentin for MSSA cultured on the 14th. She still appears to have too much swelling in the left leg even with 3 layer compression 9/30; the patient underwent her procedure on 9/28 by Dr.  Donzetta Matters at [facility]. She was discovered to have the common iliac vein measuring 12.2 mm but at the level of L4-L5 measured 3 mm. After stenting it measured 10 mm. 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 Rouleaux flow in the common femoral and femoral vein was observed much improved after stenting. We are using silver alginate to the wounds on the medial and lateral ankle on the left. 4 layer compression 10/7; the patient had fluid swelling around her knee and 4 layer compression. At the advice of vein and vascular this was reduced to 3 layer which she is tolerating better. We have been using silver alginate under 3 layer compression since last Friday 10/14; arrives with the areas on the left ankle looking a lot better. Inflammation in the area also a lot better. She came in for a nurse check on 10/9 10/21; continued nice improvement. Slight improvements in surface area of both the medial and lateral wounds on the left. A lot of the satellite lesions in the weeping erythema around these from stasis dermatitis is resolved. We have been using silver alginate CADANCE, RAUS ([id number]) 10/28; general improvement in the entire wound areas although not a lot of change in dimensions the wound certainly looks better. There is a lot less in terms of venous inflammation. Continue silver alginate this week however look towards Hydrofera Blue next week 11/4; very adherent debris on the medial wound left wound is not as bad. We have been using silver alginate. Change to [facility] today 11/11; very adherent debris on both wound areas. She went to vein and vascular last week and follow-up they put in Fisher boot on this today. He says the [facility] was adherent. Wound is definitely not as good as last week. Especially on the left there the satellite lesions look more prominent 11/18; absolutely no better. erythema on lateral aspect with tenderness. 09/30/2019 on  evaluation today patient appears to actually be doing better. Dr. Dellia Nims did put her on doxycycline last week which I do believe has helped her at this point. Fortunately there is no signs of active infection at this time. No fevers, chills, nausea, vomiting, or diarrhea. I do believe he may want extend the doxycycline for 7 additional days just to ensure everything does completely cleared up the patient is in agreement with that plan. Otherwise she is going require some sharp debridement today 12/2; patient is completing a 2-week course of doxycycline. I gave her this empirically for inflammation as well as infection when I last saw her  2 weeks ago. All of this seems to be better. She is using silver alginate she has the area on the medial aspect of the larger area laterally and the 2 small satellite regions laterally above the major wound. 12/9; the patient's wound on the left medial and left lateral calf look really quite good. We have been using silver alginate. She saw vein and vascular in follow-up on 10/09/2019. She has had a previous left greater saphenous vein ablation by Dr. Oscar La in 2016. More recently she underwent a left common iliac vein stent by Dr. 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 The swelling is improved and certainly the wounds have improved. The patient shows [place] today area on the right medial calf there is almost no wound but leaking lymphedema. She says she start this started 3 or 4 days ago. She did not traumatize it. It is not painful. She does not wear compression on that side 12/16; the patient continues to do well laterally. Medially still requiring debridement. The area on the right calf did not materialize to anything and is not currently open. We wrapped this last time. She has support stockings for that leg although I am not sure they are going to provide adequate compression 12/23; the lateral wound looks stable. Medially still requiring debridement for  tightly adherent fibrinous debris. We've been using silver alginate. Surface area not any different 12/30; neither wound is any better with regards to surface and the area on the left lateral is larger. I been using silver alginate to the left lateral which look quite good last week and Sorbact to the left medial 11/11/2019. Lateral wound area actually looks better and somewhat smaller. Medial still requires a very aggressive debridement today. We have been using Sorbact on both wound areas 1/13; not much better still adherent debris bilaterally. I been using Sorbact. She has severe venous hypertension. Probably some degree of dermal fibrosis distally. I wonder whether tighter compression might help and I am going to try that today. We also need to work on the bioburden 1/20; using Sorbact. She has severe venous hypertension status post stent placement for pelvic vein compression. We applied gentamicin last time to see if we could reduce bioburden I had some discussion with her today about the use of pentoxifylline. This is occasionally used in this setting for wounds with refractory venous insufficiency. However this interacts with Plavix. She tells me that she was put on this after stent placement for 3 months. She will call Dr. Claretha Cooper office to discuss 1/27; we are using gentamicin under Sorbact. 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 She has a stent. Wound medially is measuring smaller this week. Laterally measuring slightly larger although she has some satellite lesions superiorly 2/3; gentamicin under Sorbact under 4-layer compression. 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 She has a stent on Plavix. Her wounds are measuring smaller this week. More substantially laterally where there is a satellite lesion superiorly. 2/10; gentamicin under Sorbac. 4-layer compression. Patient communicated with Dr. Donzetta Matters at vein and vascular in  [place]. He is okay with the patient coming off Plavix I will therefore start her on pentoxifylline for a 1 month trial. In general her wounds look better today. I had some concerns about swelling in the left thigh however she measures 61.5 on the right and 63 on the mid thigh which does not suggest there is any difficulty. The patient is not describing any pain. 2/17; gentamicin under  Sorbac 4-layer compression. She has been on pentoxifylline for 1 week and complains of loose stool. No nausea she is eating and drinking well 2/24; the patient apparently came in 2 days ago for a nurse visit when her wrap fell down. Both areas look a little worse this week macerated medially and satellite lesions laterally. Change to silver alginate today 3/3; wounds are larger today especially medially. She also has more swelling in her foot lower leg and I even noted some swelling in her posterior thigh which is tender. I wonder about the patency of her stent. Fortuitously she sees Dr. Claretha Cooper group on Friday 3/10; Mrs. Ebrahimi was seen by vein and vascular on 3/5. The patient underwent ultrasound. There was no evidence of thrombosis involving the IVC no evidence of thrombosis involving the right common iliac vein there is no evidence of thrombosis involving the right external iliac vein the left external vein is also patent. The right common iliac vein stent appears patent bilateral common femoral veins are compressible and appear patent. I was concerned about the left common iliac stent however it looks like this is functional. She has some edema in the posterior thigh that was tender she still has that this week. I also note they had trouble finding the pulses in her left foot and booked her for an ABI baseline in 4 weeks. She will follow up in 6 months for repeat IVC duplex. The patient stopped the pentoxifylline because of diarrhea. It does not look like that was being effective in any case. I have advised her  to go back on her aspirin 81 mg tablet, vascular it also suggested this 3/17; comes in today with her wound surfaces a lot better. The excoriations from last week considerably better probably secondary to the TCA. We have been using silver alginate 3/24; comes in today with smaller wounds both medially and laterally. Both required debridement. There are 2 small satellite areas superiorly laterally. She also has a very odd bandlike area in the mid calf almost looking like there was a weakness in the wrap in a localized area. I would write this off as being this however anteriorly she has a small raised ballotable area that is very tender almost reminiscent of an abscess but there was no obvious purulent surface to it. 02/04/20 upon evaluation today patient appears to be doing fairly well in regard to her wounds today. Fortunately there is no signs of active infection at this time. No fevers, chills, nausea, vomiting, or diarrhea. She has been tolerating the dressing changes without complication. Fortunately I feel like she is showing signs of improvement although has been sometime since have seen her. Nonetheless the area of concern that Dr. Dellia Nims had last week where she had possibly an area of the wrap that was we can allow the leg to bulge appears to be doing significantly better today there is no signs of anything worsening. [name], [name] ([id number]) 4/7; the patient's wounds on her medial and lateral left leg continue to contract. We have been using a regular alginate. Last week she developed an area on the right medial lower leg which is probably a venous ulcer as well. 4/14; the wounds on her left medial and lateral lower leg continue to contract. Surface eschar. We have been using regular alginate. The area on the right medial lower leg is closed. We have been putting both legs under 4-layer contraction. The patient went back to see vein and vascular she had arterial studies done  which  were apparently "quite good" per the patient although I have not read their notes I have never felt she had an arterial issue. 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 This is been longstanding and refractory to exercise, leg elevation and longstanding use of compression wraps in our clinic as well as compression stockings on the times we have been able to get these to heal 4/21; we thought she actually might be close this week however she arrives in clinic with a lot of edema in her upper left calf and into her posterior thigh. This is been an intermittent problem here. She says the wrap fell down but it was replaced with a nurse visit on Monday. We are using calcium alginate to the wounds and the wound sizes there not terribly larger than last week but there is a lot more edema 4/28; again wound edges are smaller on both sides. Her edema is better controlled than last time. She is obtained her compression pumps from [employer] although they have not been to her home to set these up. 5/5; left medial and left lateral both look stable. I am not sure the medial is any smaller. We have been using calcium alginate under 4-layer compression. oShe had an area on the right medial. This was eschared today. We have been wrapping this as well. She does not tolerate external compression stockings due to a history of various contact allergies. She has her compression pumps however the representative from the company is coming on her to show her how to use these tomorrow 5/19; patient with severe chronic venous insufficiency secondary to central venous disease. She had a stent placed in her left common iliac vein. She has done better since but still difficult to control wounds. She comes in today with nothing open on the right leg. Her areas on the left medial and left lateral are just about closed. We are using calcium alginate under 4-layer compression.  She is using her external compression pumps at home She only has 15-20 support stockings. States she cannot get anything tighter than that on. 03/30/20-Patient returns at 1 week, the wounds on the left leg are both slightly bigger, the last week she was on 3 layer compression which started to slide down. She is starting to use her lymphedema pumps although she stated on 1 day her right ankle started to swell up and she have to stop that day. Unfortunately the open area seem to oscillate between improving to the point of healing and then flaring up all to do with effectiveness of compression or lack of due to the left leg topography not keeping the compression wraps from rolling down 6/2 patient comes in with a 15/20 mmHg stocking on the right leg. She tells me that she developed a lot of swelling in her ankles she saw orthopedics she was felt to possibly be having a flare of pseudogout versus some other type of arthritis. She was put on steroids for a respiratory issue so that helps with the inflammation. She has not been using the pumps all week. She thinks the left thigh is more swollen than usual and I would agree with that. She has an appointment with Dr. Donzetta Matters 9 days or so from now 6/9; both wounds on the left medial and left lateral are smaller. We have been using calcium alginate under compression. She does not have an open wound on the right leg she is using a stocking and her  compression pumps things are going well. She has an appointment with Dr. Donzetta Matters with regards to her stent in the left common iliac vein 6/16; the wounds on the left medial and left lateral ankle continues to contract. The patient saw Dr. Donzetta Matters and I think he seems satisfied. Ordered follow-up venous reflux studies on both sides in September. Cautioned that she may need thigh-high stockings. She has been using calcium alginate under compression on the left and her own stocking on the right leg. She tells [place] there are no open  wounds on the right 6/23; left lateral is just about closed. Medial required debridement today. We have been using calcium alginate. Extensive discussion about the compression pumps she is only using these on 25 mmHg states she could not take 40 or 30 when the wrap came out to her home to demonstrate these. He said they should not feel tight 6/30; the left lateral wound has a slight amount of eschar. . The area medially is about the same using Hydrofera Blue. 7/7; left lateral wound still has some eschar. I will remove this next week may be closed. The area medially is very small using Hydrofera Blue with improvement. Unfortunately the stockings fell down. Unfortunately the blisters have developed at the edge of where the wrap fell. When this happened she says her legs hurt she did not use her pumps. We are not open Monday for her to come in and change the wraps and she had an appointment yesterday. She also tells me that she is going to have an MRI of her back. She is having pain radiating into her left anterior leg she thinks her from an L5 disc. She saw Dr. Ellene Route of neurosurgery 7/14; the area on the left lateral ankle area is closed. Still a small area medially however it looks better as well. We have been using Hydrofera Blue under 4-layer compression 7/21; left lateral ankle is still closed however her wound on the medial left calf is actually larger. This is probably because Hydrofera Blue got stuck to the wound. She came in for a nurse change on Friday and will do that again this week I was concerned about the amount of swelling that she had last week however she is using her compression pumps twice a day and the swelling seems well controlled 7/28; remaining wound on the left medial lower leg is smaller. We have been using moistened silver collagen under compression she is coming back for a nurse visit. For reasons that were not really clear she was just keeping her legs elevated and not  using her compression pumps. I have asked her to use the compression pumps. She does not have any wounds on the right leg 06/15/20-Patient returns at 2 weeks, her LLE edema is worse and she developed a blister wound that is new and has bigger posterior calf wound on right, we are using Prisma with pad, 4 layer compression. she has been on lasix 40 mg daily 8/18; patient arrives today with things a lot worse than I remember from a few weeks ago. She was seen last week. Noted that her edema was worse and that she now had a left lateral wound as well as deteriorating edema in the medial and posterior part of the lower leg. She says she is using his or her external compression pumps once a day although I wonder about the compliance. 8/25; weeping area on the right medial lower leg. This had actually gotten a small localized area  of her compression stocking wet. oOn the left side there is a large denuded area on the posterior medial lower leg and smaller area on the lateral. This was not the original areas that we dealt with. 9/1 the patient's wound on the left leg include the left lateral and left posterior. Larger superficial wounds weeping. She has very poor edema control. Tender localized edema in the left lower medial ankle/heel probably because of localized wrap issues. She freely admits she is not using the compression pumps. She has been up on her feet a lot. She thinks the hydrofera blue is contributing to the pain she is experiencing.. This is a complaint that I have occasionally heard 9/8; really not much improvement. The patient is still complaining of a lot of pain particularly when she uses compression pumps. I switched her to silver alginate last time because she found the Hydrofera Blue to be irritating. I don't hear much difference in her description with the silver alginate. 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 She has follow-up with Dr. Donzetta Matters I think for ultrasound next month MAKAI, [name] ([id number]) 9/15; quite a bit of improvement today. We have less edema and more epithelialization in both of her wound areas on the left medial and left lateral calf. These are not the site of her original wounds in this area. She says she has been using her compression pumps for 30 minutes twice a day, there pain issues that never quite understood. Silver alginate as the primary dressing 9/22; continued improvement. Both areas medially and laterally still have a small open area there is some eschar. She continues to complain of left medial ankle pain. Swelling in the leg is in much better condition. We have been using silver alginate 9/29; continued improvement. Both areas medially and laterally in the left calf look as though they are close some minor surface eschar but I think this is epithelialized. She comes in today saying she has a ruptured disc at L4-L5 cannot bend over to put on her stockings. 10/6; patient comes in today with no open wounds on either leg. However her edema on the left leg in the upper one third of the lower leg is poorly controlled nonpitting. She says that she could not use the pumps for 2 days and then she has been using the last couple of days. It is not clear to me she has been able to get her stocking on. She has back problems. Mrs. Fogal has severe chronic venous insufficiency with secondary lymphedema. Her venous insufficiency is partially centrally mediated and that she is now post stent in the left common iliac vein. [facility] Thurner's syndrome/physiology]. She follows up with them on 10/15. She wears 20/30 below-knee stockings. She is supposed to use compression pumps at home although I think her compliance about with this is been less than 100%. I have asked her to use these 3 times a day. Finally I think she has lipodermatosclerosis in the left lower leg with an inverted  bottle sign. It is been a major problem controlling the edema in the left leg. The right leg we have had wounds on but not as significant a problem is on the left READMISSION 04/12/2021 Mrs. Calandro is a 76 year old woman we know well in this clinic. She has severe chronic venous insufficiency. 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 I believe she has had bilateral greater saphenous vein ablation in the past as well. She tells me that this wound opened sometime in March. She had a fall and thinks it was initially abrasion. She developed areas she describes as little blisters on the anterior part of her leg and she saw dermatology and was treated for methicillin staph aureus with several rounds of antibiotics. She has been using support stockings on the left leg and says this is the only thing she can get on. Her compression pump use maybe once a day she says if she did not use one she use the other. She comes in today with incredible swelling in the left leg with a wound on the left posterior calf. She has been using Neosporin to this previously a hydrocolloid. 6/15; patient arrives back for 1 week follow-up.Marland Kitchen Apparently her wrap fell down she did not call us to replace this. He has poor edema control. She only uses her compression pumps once a day 6/29; patient presents for 1 week follow-up. She has tolerated the compression wrap well. We have been using Iodoflex under the wrap. She has no issues or complaints today. 7/6; patient presents for 1 week follow-up. She states that the compression wrap rolled down her leg 4 days ago. She has been trying to keep the area covered but has no dressings at home to use. She denies signs of infection. 7/13; patient presents for 1 week follow-up. She states that her compression wrap rolled down her right leg and she called our office and had it placed a few days ago. She has been tolerating the current wrap well. She  states that the Iodoflex is causing a burning sensation. She denies signs of infection. 7/27; patient presents for 1 week follow-up She has tolerated the compression wrap well with Hydrofera Blue underneath. She has now developed a wound to her right lower extremity. She reports having a culture done To this area by her dermatologist that she reports is negative. She currently denies signs of infection. 8/30; patient presents for 1 week follow-up. On the left side she has tolerated the compression wrap well with Hydrofera Blue underneath. She reports some discomfort to her right lower extremity with the 3 layer compression. She currently denies signs of infection. 06/14/2021 upon evaluation today patient's wound actually appears to be doing decently well based on what I am seeing currently. She actually has 2 areas 1 on the left distal/posterior lower leg and the other on the right lower leg. Subsequently the measurements are roughly the same may be slightly smaller but in general have not made any trend towards getting worse which is great news. 06/23/2021 upon evaluation today patient appears to be doing pretty well in regard to her wounds. On the right she is having difficulty with sciatic pain and this subsequently has led to her taking the wrap off over the last week her leg is more swollen she is also been on prednisone for 10 days. With that being said I think we may want to just use a compression sock on the left that way she will not have to worry about the compression wrap being in place that she cannot get off. With that being said I do believe as well that the patient is going to need to continue with the wrapping on the left we will also need to do a little bit of sharp debridement today. 8/24; patient presents for 1 week follow-up. She has no issues  or complaints today. She denies signs of infection. 8/31; patient presents for 1 week follow-up. She has no issues or complaints today. She  has tolerated the compression wrap well on the left lower extremity. She denies signs of infection. 9/7; patient presents for follow-up. She reports that the compression wrap rolled down slightly on her right lower extremity. She reports tenderness to the wound bed. She denies increased warmth or erythema to the surrounding skin. She overall feels well. 9/14; patient presents for follow-up. She has no issues or complaints today. She denies signs of infection. PuraPly is available and patient would like to start this today. 9/21; patient presents for follow-up. She has no issues or complaints today. She tolerated the first skin substitute placement last week under compression. 9/28; patient presents for 1 week follow-up. She has no issues or complaints today. 10/5; patient presents for 1 week follow-up. She reports taking the wrap off yesterday. She was on her feet more yesterday and developed more swelling to her left lower extremity. She denies pain. Overall she is doing well. 10/12; patient presents for 1 week follow-up. She has no issues or complaints today. 10/19; patient presents for follow-up. She has no issues or complaints today. She tolerated the compression wrap well. She states she has compression stockings at home. [name], [name] ([id number]) Readmission: 11/27/2021 upon evaluation today patient appears to be doing poorly in regard to the wounds on her right leg. She also has an area of erythema in the proximal calf area of the right leg which again I do believe is a issue from the standpoint of it being warm and erythematous to touch and also seems to be somewhat localized I really think this may be more of a cellulitis issue than anything else. Fortunately I do not see any signs of active infection Systemically at this time. 12/05/2021 upon evaluation patient's wounds actually appear to be showing some signs of improvement which is not too bad and just 1 week's time. Especially  since we had to get on the right antibiotic which is only been for the past 4 days. Fortunately I do not see any signs of active infection locally nor systemically at this time that has gotten any worse. Overall I think that again with the antibiotic she is significantly improved and currently she is taking Levaquin. 12/11/2021 upon evaluation today patient appears to be doing well with regard to her leg. Between the antibiotics and the triamcinolone I feel like she is doing significantly better which is great news. Overall I am extremely pleased with where we stand. There does not appear to be any signs of active infection locally or systemically at this time. 12/18/2021 upon evaluation today patient appears to be doing well with regard to her wound. I do feel like this is showing signs of some improvement here. Little by little this is clearing up quite nicely. I think it is just a slow process but nonetheless 1 that we are seeing evidence of improvement with regard to. 12/25/2021 upon evaluation today patient appears to be doing well with regard to her wounds she is definitely making some good progress here. Fortunately I do not see any signs of active infection locally or systemically at this time which is great news. No fevers, chills, nausea, vomiting, or diarrhea. 01/02/2022 upon evaluation today patient appears to be doing well with regard to her wound. She has been tolerating the dressing changes without complication. Fortunately I do not see any evidence of active infection  locally or systemically which is great news and overall I am extremely pleased with where we stand today. 01/08/2022 upon evaluation today patient appears to be doing better in regard to her ankle ulcers. She has been tolerating the dressing changes without complication. Fortunately I do not see any signs of infection and both the ankle and leg area is making great progress. 3/13; patient presents for follow-up. She has no  issues or complaints today. She was tolerated the compression wrap well. 01/19/2022 upon evaluation today patient appears to be doing well currently in regard to her right leg which overall I think is doing excellent. She had called in to try to get her in sooner for a dressing change due to an issue where her cat scratched her on the left leg. With that being said it does not appear that this was actually a scratch but rather that it was a puncture over a small varicose vein. She had a tremendous amount of bleeding as evidenced by the amount of blood in her shoes currently. The good news is this is stopped currently the bad news is that she needs to be very careful with regard to her cats later as this could definitely be a significant problem for her. Fortunately I do not see any signs of active infection locally nor systemically at this point. 02-08-2022 on evaluation today patient actually appears to be doing significantly better. She was in the hospital due to having stepped on attack on her left heel but nonetheless this is actually showing signs of improvement at this point. I do not see any evidence of active infection locally or systemically which is great news and overall I am extremely pleased with where we stand today. Her right leg is showing no signs of infection. She has been on antibiotics while in the hospital and at discharge. 02-15-2022 upon evaluation today patient appears to be doing better in regard to her wounds. Overall I feel like we are headed in the right direction. With that being said she does have an area of irritation posteriorly that was not there last week again that makes this wound posterior look little bigger but overall things are significantly better. I am very pleased in that regard. 02-23-2022 upon evaluation today patient appears to be doing well with regard to her wound. It is doing a little bit worse only and the fact that it is dry other than that things seem to  be making good improvements here. There does not appear to be any signs of active infection at this time which is great news. 4/27; right lateral leg 4-5 small open areas with eschar on top. I used a curette to remove this. She has been using Xeroform under 3 layer compression. 03-08-2022 upon evaluation today patient appears to be doing well with regard to the wounds on her lower extremity. She has been tolerating the dressing changes without complication. Fortunately there does not appear to be any signs of active infection at this time which is great news. No fevers, chills, nausea, vomiting, or diarrhea. 03-15-2022 upon evaluation today patient appears to be doing well currently in regard to her wounds. She has been tolerating the dressing changes without complication. Fortunately there does not appear to be any evidence of active infection locally nor systemically at this time which is great news. I do believe she is getting very close to resolution. 03-22-2022 upon evaluation today patient actually appears to be doing awesome in fact she is has 1 very  tiny area immediately left open the rest of this appears to be almost completely closed. I do not see any evidence of infection locally or systemically which is great news and overall I am extremely pleased with where things stand at this point. I do think we are getting very close to complete resolution. 03-29-2022 upon evaluation today patient appears to be doing well currently in regard to her wound. In fact this appears to be almost completely healed. I think that if she still continues to have no significant drainage come next week that we will probably get a go ahead and heal her out at that point. 04-05-2022 upon evaluation today patient appears to be doing well with regard to her wounds which pretty much looking to be completely closed. With that being said she does have a very tiny area still medial that appears to be leaking a little bit her  legs really swollen as she had a wrap slipped down over the nighttime and this subsequently has been off all day meaning her leg is much more swollen than it was last week. Subsequently I am going to go and see what I can do about clearing away some of the necrotic debris here. 04-11-2022 upon evaluation today patient's wound actually is really almost nonexistent there is definitely some evidence of drainage but really I do not see anything significantly open which is great news. Fortunately I do not believe that she is showing any signs of active infection right now [name], [name]. ([id number]) which is great her swelling is significantly down. 04-19-2022 upon evaluation today patient appears to be doing well with regard to her wound. There does not appear to be any signs of active infection locally or systemically which is great news and overall I am extremely pleased with where we stand at this point. Electronic Signature(s) Signed: 04/19/2022 1:46:18 PM By: Worthy Keeler PA-C Entered By: Worthy Keeler on 04/19/2022 13:46:18 Deweese, [name] Child ([id number]) -------------------------------------------------------------------------------- Physical Exam Details Patient Name: ANALI, CABANILLA. Date of Service: 04/19/2022 1:00 PM Medical Record Number: [id number] Patient Account Number: [id number] Date of Birth/Sex: October 04, 1946 (76 y.o. F) Treating RN: Levora Dredge Primary Care Provider: [name] Other Clinician: Referring Provider: [name] Treating Provider/Extender: [name] in Treatment: 5 Constitutional [name] and well-hydrated in no acute distress. Respiratory normal breathing without difficulty. Psychiatric this patient is able to make decisions and demonstrates good insight into disease process. Alert and Oriented x 3. pleasant and cooperative. Notes Upon inspection patient's wound bed showed signs of good granulation and epithelization at this time.  Overall I am extremely happy with the fact that she is completely healed based on what I see I do think we will put a piece of Tubigrip on today she has her compression socks she will begin wearing tomorrow. Electronic Signature(s) Signed: 04/19/2022 1:46:46 PM By: Worthy Keeler PA-C Previous Signature: 04/19/2022 1:46:31 PM Version By: Worthy Keeler PA-C Entered By: Worthy Keeler on 04/19/2022 13:46:46 [name], [name] Child ([id number]) -------------------------------------------------------------------------------- Physician Orders Details Patient Name: ADRIELLA, ESSEX. Date of Service: 04/19/2022 1:00 PM Medical Record Number: [id number] Patient Account Number: [id number] Date of Birth/Sex: 1946-03-28 (76 y.o. F) Treating RN: Levora Dredge Primary Care Provider: [name] Other Clinician: Referring Provider: [name] Treating Provider/Extender: [name] in Treatment: 20 Verbal / Phone Orders: No Diagnosis Coding ICD-10 Coding Code Description I89.0 Lymphedema, not elsewhere classified I87.331 Chronic venous hypertension (idiopathic) with ulcer and inflammation of right lower extremity L97.811  Non-pressure chronic ulcer of other part of right lower leg limited to breakdown of skin Discharge From [place] o Discharge from [place] Treatment Complete - please call if any issues arise o Wear compression garments daily. Put garments on first thing when you wake up and remove them before bed. o Moisturize legs daily after removing compression garments. o Elevate, Exercise Daily and Avoid Standing for Long Periods of Time. o DO YOUR BEST to sleep in the bed at night. DO NOT sleep in your recliner. Long hours of sitting in a recliner leads to swelling of the legs and/or potential wounds on your backside. Electronic Signature(s) Signed: 04/19/2022 3:08:30 PM By: Levora Dredge Signed: 04/19/2022 4:11:01 PM By: Worthy Keeler PA-C Entered By:  Levora Dredge on 04/19/2022 13:33:42 Mcaffee, [name] Child ([id number]) -------------------------------------------------------------------------------- Problem List Details Patient Name: GRIFFIN, DEWILDE. Date of Service: 04/19/2022 1:00 PM Medical Record Number: [id number] Patient Account Number: [id number] Date of Birth/Sex: 30-[name]-1946 (76 y.o. F) Treating RN: Levora Dredge Primary Care Provider: [name] Other Clinician: Referring Provider: [name] Treating Provider/Extender: [name] in Treatment: 20 Active Problems ICD-10 Encounter Code Description Active Date MDM Diagnosis I89.0 Lymphedema, not elsewhere classified 11/27/2021 No Yes I87.331 Chronic venous hypertension (idiopathic) with ulcer and inflammation of 11/27/2021 No Yes right lower extremity L97.811 Non-pressure chronic ulcer of other part of right lower leg limited to 11/27/2021 No Yes breakdown of skin Inactive Problems Resolved Problems Electronic Signature(s) Signed: 04/19/2022 1:01:04 PM By: Worthy Keeler PA-C Entered By: Worthy Keeler on 04/19/2022 13:01:03 Codner, [name] Child ([id number]) -------------------------------------------------------------------------------- Progress Note Details Patient Name: RIELEY, KHALSA. Date of Service: 04/19/2022 1:00 PM Medical Record Number: [id number] Patient Account Number: [id number] Date of Birth/Sex: 24-Mar-1946 (76 y.o. F) Treating RN: Levora Dredge Primary Care Provider: [name] Other Clinician: Referring Provider: [name] Treating Provider/Extender: [name] in Treatment: 20 Subjective Chief Complaint Information obtained from Patient Right LE Ulcer History of Present Illness (HPI) Pleasant 76 year old with history of chronic venous insufficiency. No diabetes or peripheral vascular disease. Left ABI 1.29. Questionable history of left lower extremity DVT. She developed a recurrent ulceration on her left lateral  calf in December 2015, which she attributes to poor diet and subsequent lower extremity edema. She underwent endovenous laser ablation of her left greater saphenous vein in 2010. She underwent laser ablation of accessory branch of left GSV in April 2016 by Dr. Kellie Simmering at [facility]. She was previously wearing Unna boots, which she tolerated well. Tolerating 2 layer compression and cadexomer iodine. She returns to clinic for follow-up and is without new complaints. She denies any significant pain at this time. She reports persistent pain with pressure. No claudication or ischemic rest pain. No fever or chills. No drainage. READMISSION 11/13/16; this is a 76 year old woman who is not a diabetic. She is here for a review of a painful area on her left medial lower extremity. I note that she was seen here previously last year for wound I believe to be in the same area. At that time she had undergone previously a left greater saphenous vein ablation by Dr. Kellie Simmering and she had a ablation of the anterior accessory branch of the left greater saphenous vein in March 2016. Seeing that the wound actually closed over. In reviewing the history with her today the ulcer in this area has been recurrent. She describes a biopsy of this area in 2009 that only showed stasis physiology. She also has a history of  today malignant melanoma in the right shoulder for which she follows with Dr. 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 Do not see that she has ever had arterial studies in the left leg. She tells me she has a follow-up with Dr. Kellie Simmering in roughly 10 days In any case she developed the reopening of this area roughly a month ago. On the background of this she describes rapidly increasing edema which has responded to Lasix 40 mg and metolazone 2.5 mg as well as the patient's lymph massage. She has been  told she has both venous insufficiency and lymphedema but she cannot tolerate compression stockings 11/28/16; the patient saw Dr. Kellie Simmering recently. Per the patient he did arterial Dopplers in the office that did not show evidence of arterial insufficiency, per the patient he stated "treat this like an ordinary venous ulcer". She also saw her dermatologist Dr. Ronnald Ramp who felt that this was more of a vascular ulcer. In general things are improving although she arrives today with increasing bilateral lower extremity edema with weeping a deeper fluid through the wound on the left medial leg compatible with some degree of lymphedema 12/04/16; the patient's wound is fully epithelialized but I don't think fully healed. We will do another week of depression with Promogran and TCA however I suspect we'll be able to discharge her next week. This is a very unusual-looking wound which was initially a figure-of-eight type wound lying on its side surrounded by petechial like hemorrhage. She has had venous ablation on this side. She apparently does not have an arterial issue per Dr. Kellie Simmering. She saw her dermatologist thought it was "vascular". Patient is definitely going to need ongoing compression and I talked about this with her today she will go to elastic therapy after she leaves here next week 12/11/16; the patient's wound is not completely closed today. She has surrounding scar tissue and in further discussion with the patient it would appear that she had ulcers in this area in 2009 for a prolonged period of time ultimately requiring a punch biopsy of this area that only showed venous insufficiency. I did not previously pickup on this part of the history from the patient. 12/18/16; the patient's wound is completely epithelialized. There is no open area here. She has significant bilateral venous insufficiency with secondary lymphedema to a mild-to-moderate degree she does not have compression stockings.. She did not  say anything to me when I was in the room, she told our intake nurse that she was still having pain in this area. This isn't unusual recurrent small open area. She is going to go to elastic therapy to obtain compression stockings. 12/25/16; the patient's wound is fully epithelialized. There is no open area here. The patient describes some continued episodic discomfort in this area medial left calf. However everything looks fine and healed here. She is been to elastic therapy and caught herself 15-20 mmHg stockings, they apparently were having trouble getting 20-30 mm stockings in her size 01/22/17; this is a patient we discharged from the clinic a month ago. She has a recurrent open wound on her medial left calf. She had 15 mm support stockings. I told her I thought she needed 20-30 mm compression stockings. She tells me that she has been ill with hospitalization secondary to asthma and is been found to have severe hypokalemia likely secondary to a combination of Lasix  and metolazone. This morning she noted blistering and leaking fluid on the posterior part of her left leg. She called our intake nurse urgently and we was saw her this afternoon. She has not had any real discomfort here. I don't know that she's been wearing any stockings on this leg for at least 2-3 days. ABIs in this clinic were 1.21 on the right and 1.3 on the left. She is previously seen vascular surgery who does not think that there is a peripheral arterial issue. 01/30/17; Patient arrives with no open wound on the left leg. She has been to elastic therapy and obtained 20-74mhg below knee stockings and she has one on the right leg today. READMISSION 02/19/18; this PSyleris a now 76year old patient we've had in this clinic perhaps 3 times before. I had last looked at her from January 07 December 2016 with an area on the medial left leg. We discharged her on 12/25/16 however she had to be readmitted on 01/22/17 with a recurrence. I  have in my notes that we discharged her on 20-30 mm stockings although she tells me she was only wearing support hose because she cannot get stockings on predominantly related to her cervical spine surgery/issues. She [place] with an anterior accessory branch ablation I think both of these were in 2016. On one of the previous visit she had a biopsy noted 2009 that was negative. She is not felt to have an arterial issue. She is not a diabetic. She does have a history of obstructive sleep apnea hypertension asthma as well as chronic venous insufficiency and lymphedema. [name], [name]([id number] On this occasion she noted 2 dry scaly patch on her left leg. She tried to put lotion on this it didn't really help. There were 2 open areas.the patient has been seeing her primary physician from 02/05/18 through 02/14/18. She had Unna boots applied. The superior wound now on the lateral left leg has closed but she's had one wound that remains open on the lateral left leg. This is not the same spot as we dealt with in 2018. ABIs in this clinic were 1.3 bilaterally 02/26/18; patient has a small wound on the left lateral calf. Dimensions are down. She has chronic venous insufficiency and lymphedema. 03/05/18; small open area on the left lateral calf. Dimensions are down. Tightly adherent necrotic debris over the surface of the wound which was difficult to remove. Also the dressing [over collagen] stuck to the wound surface. This was removed with some difficulty as well. Change the primary dressing to Hydrofera Blue ready 03/12/18; small open area on the left lateral calf. Comes in with tightly adherent surface eschar as well as some adherent Hydrofera Blue. 03/19/18; open area on the left lateral calf. Again adherent surface eschar as well as some adherent Hydrofera Blue nonviable subcutaneous tissue. She  complained of pain all week even with the reduction from 4-3 layer compression I put on last week. Also she had an increase in her ankle and calf measurements probably related to the same thing. 03/26/18; open area on the left lateral calf. A very small open area remains here. We used silver alginate starting last week as the Hydrofera Blue seem to stick to the wound bed. In using 4-layer compression 04/02/18; the open area in the left lateral calf at some adherent slough which I removed there is no open area here. We  are able to transition her into her own compression stocking. Truthfully I think this is probably his support hose. However this does not maintain skin integrity will be limited. She cannot put over the toe compression stockings on because of neck problems hand problems etc. She is allergic to the lining layer of juxta lites. We might be forced to use extremitease stocking should this fail READMIT 11/24/2018 Patient is now a 76 year old woman who is not a diabetic. She has been in this clinic on at least 3 previous occasions largely with recurrent wounds on her left leg secondary to chronic venous insufficiency with secondary lymphedema. Her situation is complicated by inability to get stockings on and an allergy to neoprene which is apparently a component and at least juxta lites and other stockings. As a result she really has not been wearing any stockings on her legs. She tells [place] that roughly 2 or 3 weeks ago she started noticing a stinging sensation just above her ankle on the left medial aspect. She has been diagnosed with pseudogout and she wondered whether this was what she was experiencing. She tried to dress this with something she bought at the store however subsequently it pulled skin off and now she has an open wound that is not improving. She has been using Vaseline gauze with a cover bandage. She saw her primary doctor last week who put an Haematologist on her. ABIs in this  clinic was 1.03 on the left 2/12; the area is on the left medial ankle. Odd-looking wound with what looks to be surface epithelialization but a multitude of small petechial openings. This clearly not closed yet. We have been using silver alginate under 3 layer compression with TCA 2/19; the wound area did not look quite as good this week. Necrotic debris over the majority of the wound surface which required debridement. She continues to have a multitude of what looked to be small petechial openings. She reminds [place] that she had a biopsy on this initially during her first outbreak in 2015 in [facility]. She expresses concern about this being a possible melanoma. 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 I assured her that this does not look anything like melanoma. Except for the petechial reaction it does look like a venous insufficiency area and she certainly has evidence of this on both sides 2/26; a difficult area on the left medial ankle. The patient clearly has chronic venous hypertension with some degree of lymphedema. The odd thing about the area is the small petechial hemorrhages. I am not really sure how to explain this. This was present last time and this is not a compression injury. We have been using Hydrofera Blue which I changed to last week 3/4; still using Hydrofera Blue. Aggressive debridement today. She does not have known arterial issues. She has seen Dr. Kellie Simmering at [facility] vein and vascular and and has an ablation on the left. [Anterior accessory branch of the greater saphenous]. From what I remember they did not feel she had an arterial issue. The patient has had this area biopsied in 2009 at [facility] and by her recollection they said this was "stasis". She is also follow-up with dermatology locally who thought that this was more of a vascular issue 3/11; using  Hydrofera Blue. Aggressive debridement today. She does not have an arterial issue. We are using 3 layer compression although we may need to go  to 4. The patient has been in for multiple changes to her wrap since I last saw her a week ago. She says that the area was leaking. I do not have too much more information on what was found 01/19/19 on evaluation today patient was actually being seen for a nurse visit when unfortunately she had the area on her left lateral lower extremity as well as weeping from the right lower extremity that became apparent. Therefore we did end up actually seeing her for a full visit with myself. She is having some pain at this site as well but fortunately nothing too significant at this point. No fevers, chills, nausea, or vomiting noted at this time. 3/18-Patient is back to the clinic with the left leg venous leg ulcer, the ulcer is larger in size, has a surface that is densely adherent with fibrinous tissue, the Hydrofera Blue was used but is densely adherent and there was difficulty in removing it. The right lower extremity was also wrapped for weeping edema. Patient has a new area over the left lateral foot above the malleolus that is small and appears to have no debris with intact surrounding skin. Patient is on increased dose of Lasix also as a means to edema management 3/25; the patient has a nonhealing venous ulcer on the medial left leg and last week developed a smaller area on the lateral left calf. We have been using Hydrofera Blue with a contact layer. 4/1; no major change in these wounds areas. Left medial and more recently left lateral calf. I tried Iodoflex last week to aid in debridement she did not tolerate this. She stated her pain was terrible all week. She took the top layer of the 4 layer compression off. 4/8; the patient actually looks somewhat better in terms of her more prominent left lateral calf wound. There is some healthy looking tissue here. She  is still complaining of a lot of discomfort. 4/15; patient in a lot of pain secondary to sciatica. She is on a prednisone taper prescribed by her primary physician. She has the 2 areas one on the left medial and more recently a smaller area on the left lateral calf. Both of these just above the malleoli 4/22; her back pain is better but she still states she is very uncomfortable and now feels she is intolerant to the [employer]. No real change in the wounds we have been using Sorbact. She has been previously intolerant to Iodoflex. There is not a lot of option about what we can use to debride this wound under compression that she no doubt needs. sHe states Ultram no longer works for her pain 4/29; no major change in the wounds slightly increased depth. Surface on the original medial wound perhaps somewhat improved however the more recent area on the lateral left ankle is 100% covered in very adherent debris we have been using Sorbact. She tolerates 4 layer compression well and her edema control is a lot better. She has not had to come in for a nurse check 5/6; no major change in the condition of the wounds. She did consent to debridement today which was done with some difficulty. Continuing Sorbact. She did not tolerate Iodoflex. She was in for a check of her compression the day after we wrapped her last week this was adjusted but nothing much was found 5/13; no major change in the condition or area of the wounds. I was able to get a fairly aggressive debridement done on the lateral  left leg NAMITA, [name] ([id number]) wound. Even using Sorbact under compression. She came back in on Friday to have the wrap changed. She says she felt uncomfortable on the lateral aspect of her ankle. She has a long history of chronic venous insufficiency including previous ablation surgery on this side. 5/20-Patient returns for wounds on left leg with both wounds covered in slough, with the lateral leg wound larger in  size, she has been in 3 layer compression and felt more comfortable, she describes pain in ankle, in leg and pins and needles in foot, and is about to try Pamelor for this 6/3; wounds on the left lateral and left medial leg. The area medially which is the most recent of the 2 seems to have had the largest increase in dimensions. We have been using Sorbac to try and debride the surface. She has been to see orthopedics they apparently did a plain x-ray that was indeterminant. Diagnosed her with neuropathy and they have ordered an MRI to determine if there is underlying osteomyelitis. This was not high on my thought list but I suppose it is prudent. We have advised her to make an appointment with vein and vascular in [place]. She has a history of a left greater saphenous and accessory vein ablations I wonder if there is anything else that can be done from a surgical point of view to help in these difficult refractory wounds. We have previously healed this wound on one occasion but it keeps on reopening [medial side] 6/10; deep tissue culture I did last week I think on the left medial wound showed both moderate E. coli and moderate staph aureus [MSSA]. She is going to require antibiotics and I have chosen Augmentin. We have been using Sorbact and we have made better looking wound surface on both sides but certainly no improvement in wound area. She was back in last Friday apparently for a dressing changes the wrap was hurting her outer left ankle. She has not managed to get a hold of vein and vascular in [place]. We are going to have to make her that appointment 6/17; patient is tolerating the Augmentin. She had an MRI that I think was ordered by orthopedic surgeon this did not show osteomyelitis or an abscess did suggest cellulitis. We have been using Sorbact to the lateral and medial ankles. We have been trying to arrange a follow-up appointment with vein and vascular in [place] or did her  original ablations. We apparently an area sent the request to vein and vascular in [facility] 6/24; patient has completed the Augmentin. We do not yet have a vein and vascular appointment in [place]. I am not sure what the issue is here we have asked her to call tomorrow. We are using Sorbact. Making some improvements and especially the medial wound. Both surfaces however look better medial and lateral. 7/1; the patient has been in contact with vein and vascular in [place] but has not yet received an appointment. Using Sorbact we have gradually improve the wound surface with no improvement in surface area. She is approved for Apligraf but the wound surface still is not completely viable. She has not had to come in for a dressing change 7/8; the patient has an appointment with vein and vascular on 7/31 which is a Friday afternoon. She is concerned about getting back here for [place] to dress her wounds. I think it is important to have them goal for her venous reflux/history of ablations etc. to see if anything else  can be done. She apparently tested positive for 1 of the blood tests with regards to lupus and saw a rheumatologist. He has raised the issue of vasculitis again. I have had this thought in the past however the evidence seems overwhelming that this is a venous reflux etiology. If the rheumatologist tells me there is clinical and laboratory investigation is positive for lupus I will rethink this. 7/15; the patient's wound surfaces are quite a bit better. The medial area which was her original wound now has no depth although the lateral wound which was the more recent area actually appears larger. Both with viable surfaces which is indeed better. Using Sorbact. I wanted to use Apligraf on her however there is the issue of the vein and vascular appointment on 7/31 at 2:00 in the afternoon which would not allow her to get back to be rewrapped and they would no doubt remove the graft 7/22; the  patient's wound surfaces have moderate amount of debris although generally look better. The lateral one is larger with 2 small satellite areas superiorly. We are waiting for her vein and vascular appointment on 7/31. She has been approved for Apligraf which I would like to use after th 7/29; wound surfaces have improved no debridement is required we have been using Sorbact. She sees vein and vascular on Friday with this so question of whether anything can be done to lessen the likelihood of recurrence and/or speed the healing of these areas. She is already had previous ablations. She no doubt has severe venous hypertension 8/5-Patient returns at 1 week, she was in [place] for 3 days by her podiatrist, we have been using so backed to the wound, she has increased pain in both the wounds on the left lower leg especially the more distal one on the lateral aspect 8/12-Patient returns at 1 week and she is agreeable to having debridement in both wounds on her left leg today. We have been using Sorbact, and vascular studies were reviewed at last visit 8/19; the patient arrives with her wounds fairly clean and no debridement is required. We have used Sorbact which is really done a nice job in cleaning up these very difficult wound surfaces. The patient saw Dr. Donzetta Matters of vascular surgery on 7/31. He did not feel that there was an arterial component. He felt that her treated greater saphenous vein is adequately addressed and that the small saphenous vein did not appear to be involved significantly. She was also noted to have deep venous reflux which is not treatable. Dr. Donzetta Matters mentioned the possibility of a central obstructive component leading to reflux and he offered her central venography. She wanted to discuss this or think about it. I have urged her to go ahead with this. She has had recurrent difficult wounds in these areas which do heal but after months in the clinic. If there is anything that can be done  to reduce the likelihood of this I think it is worth it. 9/2 she is still working towards getting follow-up with Dr. Donzetta Matters to schedule her CT. Things are quite a bit worse venography. I put Apligraf on 2 weeks ago on both wounds on the medial and lateral part of her left lower leg. She arrives in clinic today with 3 superficial additional wounds above the area laterally and one below the wound medially. She describes a lot of discomfort. I think these are probably wrapped injuries. Does not look like she has cellulitis. 07/20/2019 on evaluation today patient appears  to be doing somewhat poorly in regard to her lower extremity ulcers. She in fact showed signs of erythema in fact we may even be dealing with an infection at this time. Unfortunately I am unsure if this is just infection or if indeed there may be some allergic reaction that occurred as a result of the Apligraf application. With that being said that would be unusual but nonetheless not impossible in this patient is one who is unfortunately allergic to quite a bit. Currently we have been using the Sorbact which seems to do as well as anything for her. I do think we may want to obtain a culture today to see if there is anything showing up there that may need to be addressed. 9/16; noted that last week the wounds look worse in 1 week follow-up of the Apligraf. Using Sorbact as of 2 days ago. She arrives with copious amounts of drainage and new skin breakdown on the back of the left calf. The wounds arm more substantial bilaterally. There is a fair amount of swelling in the left calf no overt DVT there is edema present I think in the left greater than right thigh. She is supposed to go on 9/28 for CT venography. The wounds on the medial and lateral calf are worse and she has new skin breakdown posteriorly at least new for me. This is almost developing into a circumferential wound area The Apligraf was taken off last week which I agree with things  are not going in the right direction a culture was done we do not have that back yet. She is on Augmentin that she started 2 days ago 9/23; dressing was changed by her nurses on Monday. In general there is no improvement in the wound areas although the area looks less angry than last week. She did get Augmentin for MSSA cultured on the 14th. She still appears to have too much swelling in the left leg even with 3 layer compression 9/30; the patient underwent her procedure on 9/28 by Dr. Donzetta Matters at vascular and vein specialist. She was discovered to have the common iliac vein measuring 12.2 mm but at the level of L4-L5 measured 3 mm. After stenting it measured 10 mm. 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 Rouleaux flow in the common femoral and femoral vein was observed much improved after stenting. We are using silver alginate to the wounds on the medial and lateral ankle on the left. 4 layer compression 10/7; the patient had fluid swelling around her knee and 4 layer compression. At the advice of vein and vascular this was reduced to 3 layer which she is tolerating better. We have been using silver alginate under 3 layer compression since last Friday SHER, SHAMPINE ([id number]) 10/14; arrives with the areas on the left ankle looking a lot better. Inflammation in the area also a lot better. She came in for a nurse check on 10/9 10/21; continued nice improvement. Slight improvements in surface area of both the medial and lateral wounds on the left. A lot of the satellite lesions in the weeping erythema around these from stasis dermatitis is resolved. We have been using silver alginate 10/28; general improvement in the entire wound areas although not a lot of change in dimensions the wound certainly looks better. There is a lot less in terms of venous inflammation. Continue silver alginate this week however look towards Hydrofera Blue next week 11/4; very adherent debris on the  medial wound left wound  is not as bad. We have been using silver alginate. Change to [facility] today 11/11; very adherent debris on both wound areas. She went to vein and vascular last week and follow-up they put in [place] boot on this today. He says the [facility] was adherent. Wound is definitely not as good as last week. Especially on the left there the satellite lesions look more prominent 11/18; absolutely no better. erythema on lateral aspect with tenderness. 09/30/2019 on evaluation today patient appears to actually be doing better. Dr. Dellia Nims did put her on doxycycline last week which I do believe has helped her at this point. Fortunately there is no signs of active infection at this time. No fevers, chills, nausea, vomiting, or diarrhea. I do believe he may want extend the doxycycline for 7 additional days just to ensure everything does completely cleared up the patient is in agreement with that plan. Otherwise she is going require some sharp debridement today 12/2; patient is completing a 2-week course of doxycycline. I gave her this empirically for inflammation as well as infection when I last saw her 2 weeks ago. All of this seems to be better. She is using silver alginate she has the area on the medial aspect of the larger area laterally and the 2 small satellite regions laterally above the major wound. 12/9; the patient's wound on the left medial and left lateral calf look really quite good. We have been using silver alginate. She saw vein and vascular in follow-up on 10/09/2019. She has had a previous left greater saphenous vein ablation by Dr. Oscar La in 2016. More recently she underwent a left common iliac vein stent by Dr. 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 The swelling is improved and certainly the wounds have improved. The patient shows [place] today area on the right medial calf there is almost no wound but leaking lymphedema. She says she start this started 3 or  4 days ago. She did not traumatize it. It is not painful. She does not wear compression on that side 12/16; the patient continues to do well laterally. Medially still requiring debridement. The area on the right calf did not materialize to anything and is not currently open. We wrapped this last time. She has support stockings for that leg although I am not sure they are going to provide adequate compression 12/23; the lateral wound looks stable. Medially still requiring debridement for tightly adherent fibrinous debris. We've been using silver alginate. Surface area not any different 12/30; neither wound is any better with regards to surface and the area on the left lateral is larger. I been using silver alginate to the left lateral which look quite good last week and Sorbact to the left medial 11/11/2019. Lateral wound area actually looks better and somewhat smaller. Medial still requires a very aggressive debridement today. We have been using Sorbact on both wound areas 1/13; not much better still adherent debris bilaterally. I been using Sorbact. She has severe venous hypertension. Probably some degree of dermal fibrosis distally. I wonder whether tighter compression might help and I am going to try that today. We also need to work on the bioburden 1/20; using Sorbact. She has severe venous hypertension status post stent placement for pelvic vein compression. We applied gentamicin last time to see if we could reduce bioburden I had some discussion with her today about the use of pentoxifylline. This is occasionally used in this setting for wounds with refractory venous insufficiency. However this interacts  with Plavix. She tells me that she was put on this after stent placement for 3 months. She will call Dr. Claretha Cooper office to discuss 1/27; we are using gentamicin under Sorbact. 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 She has a stent. Wound medially is measuring  smaller this week. Laterally measuring slightly larger although she has some satellite lesions superiorly 2/3; gentamicin under Sorbact under 4-layer compression. 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 She has a stent on Plavix. Her wounds are measuring smaller this week. More substantially laterally where there is a satellite lesion superiorly. 2/10; gentamicin under Sorbac. 4-layer compression. Patient communicated with Dr. Donzetta Matters at vein and vascular in [place]. He is okay with the patient coming off Plavix I will therefore start her on pentoxifylline for a 1 month trial. In general her wounds look better today. I had some concerns about swelling in the left thigh however she measures 61.5 on the right and 63 on the mid thigh which does not suggest there is any difficulty. The patient is not describing any pain. 2/17; gentamicin under Sorbac 4-layer compression. She has been on pentoxifylline for 1 week and complains of loose stool. No nausea she is eating and drinking well 2/24; the patient apparently came in 2 days ago for a nurse visit when her wrap fell down. Both areas look a little worse this week macerated medially and satellite lesions laterally. Change to silver alginate today 3/3; wounds are larger today especially medially. She also has more swelling in her foot lower leg and I even noted some swelling in her posterior thigh which is tender. I wonder about the patency of her stent. Fortuitously she sees Dr. Claretha Cooper group on Friday 3/10; Mrs. Waitman was seen by vein and vascular on 3/5. The patient underwent ultrasound. There was no evidence of thrombosis involving the IVC no evidence of thrombosis involving the right common iliac vein there is no evidence of thrombosis involving the right external iliac vein the left external vein is also patent. The right common iliac vein stent appears patent bilateral common femoral veins are compressible and  appear patent. I was concerned about the left common iliac stent however it looks like this is functional. She has some edema in the posterior thigh that was tender she still has that this week. I also note they had trouble finding the pulses in her left foot and booked her for an ABI baseline in 4 weeks. She will follow up in 6 months for repeat IVC duplex. The patient stopped the pentoxifylline because of diarrhea. It does not look like that was being effective in any case. I have advised her to go back on her aspirin 81 mg tablet, vascular it also suggested this 3/17; comes in today with her wound surfaces a lot better. The excoriations from last week considerably better probably secondary to the TCA. We have been using silver alginate 3/24; comes in today with smaller wounds both medially and laterally. Both required debridement. There are 2 small satellite areas superiorly laterally. She also has a very odd bandlike area in the mid calf almost looking like there was a weakness in the wrap in a localized area. I would write this off as being this however anteriorly she has a small raised ballotable area that is very tender almost reminiscent of an abscess but there was no obvious purulent surface to it. JAKERA, BEAUPRE ([id number]) 02/04/20 upon evaluation today patient appears to be doing fairly well in  regard to her wounds today. Fortunately there is no signs of active infection at this time. No fevers, chills, nausea, vomiting, or diarrhea. She has been tolerating the dressing changes without complication. Fortunately I feel like she is showing signs of improvement although has been sometime since have seen her. Nonetheless the area of concern that Dr. Dellia Nims had last week where she had possibly an area of the wrap that was we can allow the leg to bulge appears to be doing significantly better today there is no signs of anything worsening. 4/7; the patient's wounds on her medial and lateral  left leg continue to contract. We have been using a regular alginate. Last week she developed an area on the right medial lower leg which is probably a venous ulcer as well. 4/14; the wounds on her left medial and lateral lower leg continue to contract. Surface eschar. We have been using regular alginate. The area on the right medial lower leg is closed. We have been putting both legs under 4-layer contraction. The patient went back to see vein and vascular she had arterial studies done which were apparently "quite good" per the patient although I have not read their notes I have never felt she had an arterial issue. 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 This is been longstanding and refractory to exercise, leg elevation and longstanding use of compression wraps in our clinic as well as compression stockings on the times we have been able to get these to heal 4/21; we thought she actually might be close this week however she arrives in clinic with a lot of edema in her upper left calf and into her posterior thigh. This is been an intermittent problem here. She says the wrap fell down but it was replaced with a nurse visit on Monday. We are using calcium alginate to the wounds and the wound sizes there not terribly larger than last week but there is a lot more edema 4/28; again wound edges are smaller on both sides. Her edema is better controlled than last time. She is obtained her compression pumps from [employer] although they have not been to her home to set these up. 5/5; left medial and left lateral both look stable. I am not sure the medial is any smaller. We have been using calcium alginate under 4-layer compression. She had an area on the right medial. This was eschared today. We have been wrapping this as well. She does not tolerate external compression stockings due to a history of various contact allergies. She has her compression  pumps however the representative from the company is coming on her to show her how to use these tomorrow 5/19; patient with severe chronic venous insufficiency secondary to central venous disease. She had a stent placed in her left common iliac vein. She has done better since but still difficult to control wounds. She comes in today with nothing open on the right leg. Her areas on the left medial and left lateral are just about closed. We are using calcium alginate under 4-layer compression. She is using her external compression pumps at home She only has 15-20 support stockings. States she cannot get anything tighter than that on. 03/30/20-Patient returns at 1 week, the wounds on the left leg are both slightly bigger, the last week she was on 3 layer compression which started to slide down. She is starting to use her lymphedema pumps although she stated on 1 day her right ankle started  to swell up and she have to stop that day. Unfortunately the open area seem to oscillate between improving to the point of healing and then flaring up all to do with effectiveness of compression or lack of due to the left leg topography not keeping the compression wraps from rolling down 6/2 patient comes in with a 15/20 mmHg stocking on the right leg. She tells me that she developed a lot of swelling in her ankles she saw orthopedics she was felt to possibly be having a flare of pseudogout versus some other type of arthritis. She was put on steroids for a respiratory issue so that helps with the inflammation. She has not been using the pumps all week. She thinks the left thigh is more swollen than usual and I would agree with that. She has an appointment with Dr. Donzetta Matters 9 days or so from now 6/9; both wounds on the left medial and left lateral are smaller. We have been using calcium alginate under compression. She does not have an open wound on the right leg she is using a stocking and her compression pumps things are  going well. She has an appointment with Dr. Donzetta Matters with regards to her stent in the left common iliac vein 6/16; the wounds on the left medial and left lateral ankle continues to contract. The patient saw Dr. Donzetta Matters and I think he seems satisfied. Ordered follow-up venous reflux studies on both sides in September. Cautioned that she may need thigh-high stockings. She has been using calcium alginate under compression on the left and her own stocking on the right leg. She tells [place] there are no open wounds on the right 6/23; left lateral is just about closed. Medial required debridement today. We have been using calcium alginate. Extensive discussion about the compression pumps she is only using these on 25 mmHg states she could not take 40 or 30 when the wrap came out to her home to demonstrate these. He said they should not feel tight 6/30; the left lateral wound has a slight amount of eschar. . The area medially is about the same using Hydrofera Blue. 7/7; left lateral wound still has some eschar. I will remove this next week may be closed. The area medially is very small using Hydrofera Blue with improvement. Unfortunately the stockings fell down. Unfortunately the blisters have developed at the edge of where the wrap fell. When this happened she says her legs hurt she did not use her pumps. We are not open Monday for her to come in and change the wraps and she had an appointment yesterday. She also tells me that she is going to have an MRI of her back. She is having pain radiating into her left anterior leg she thinks her from an L5 disc. She saw Dr. Ellene Route of neurosurgery 7/14; the area on the left lateral ankle area is closed. Still a small area medially however it looks better as well. We have been using Hydrofera Blue under 4-layer compression 7/21; left lateral ankle is still closed however her wound on the medial left calf is actually larger. This is probably because Hydrofera Blue got stuck to  the wound. She came in for a nurse change on Friday and will do that again this week I was concerned about the amount of swelling that she had last week however she is using her compression pumps twice a day and the swelling seems well controlled 7/28; remaining wound on the left medial lower leg is smaller.  We have been using moistened silver collagen under compression she is coming back for a nurse visit. For reasons that were not really clear she was just keeping her legs elevated and not using her compression pumps. I have asked her to use the compression pumps. She does not have any wounds on the right leg 06/15/20-Patient returns at 2 weeks, her LLE edema is worse and she developed a blister wound that is new and has bigger posterior calf wound on right, we are using Prisma with pad, 4 layer compression. she has been on lasix 40 mg daily 8/18; patient arrives today with things a lot worse than I remember from a few weeks ago. She was seen last week. Noted that her edema was worse and that she now had a left lateral wound as well as deteriorating edema in the medial and posterior part of the lower leg. She says she is using his or her external compression pumps once a day although I wonder about the compliance. 8/25; weeping area on the right medial lower leg. This had actually gotten a small localized area of her compression stocking wet. On the left side there is a large denuded area on the posterior medial lower leg and smaller area on the lateral. This was not the original areas that we dealt with. 9/1 the patient's wound on the left leg include the left lateral and left posterior. Larger superficial wounds weeping. She has very poor edema control. Tender localized edema in the left lower medial ankle/heel probably because of localized wrap issues. She freely admits she is not using the compression pumps. She has been up on her feet a lot. She thinks the hydrofera blue is contributing to the  pain she is experiencing.. This is a complaint that I have occasionally heard MECHELL, GIRGIS ([id number]) 9/8; really not much improvement. The patient is still complaining of a lot of pain particularly when she uses compression pumps. I switched her to silver alginate last time because she found the Hydrofera Blue to be irritating. I don't hear much difference in her description with the silver alginate. 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 She has follow-up with Dr. Donzetta Matters I think for ultrasound next month 9/15; quite a bit of improvement today. We have less edema and more epithelialization in both of her wound areas on the left medial and left lateral calf. These are not the site of her original wounds in this area. She says she has been using her compression pumps for 30 minutes twice a day, there pain issues that never quite understood. Silver alginate as the primary dressing 9/22; continued improvement. Both areas medially and laterally still have a small open area there is some eschar. She continues to complain of left medial ankle pain. Swelling in the leg is in much better condition. We have been using silver alginate 9/29; continued improvement. Both areas medially and laterally in the left calf look as though they are close some minor surface eschar but I think this is epithelialized. She comes in today saying she has a ruptured disc at L4-L5 cannot bend over to put on her stockings. 10/6; patient comes in today with no open wounds on either leg. However her edema on the left leg in the upper one third of the lower leg is poorly controlled nonpitting. She says that she could not use the pumps for 2 days and  then she has been using the last couple of days. It is not clear to me she has been able to get her stocking on. She has back problems. Mrs. Kobler has severe chronic venous insufficiency with secondary  lymphedema. Her venous insufficiency is partially centrally mediated and that she is now post stent in the left common iliac vein. [name] Thurner's syndrome/physiology]. She follows up with them on 10/15. She wears 20/30 below-knee stockings. She is supposed to use compression pumps at home although I think her compliance about with this is been less than 100%. I have asked her to use these 3 times a day. Finally I think she has lipodermatosclerosis in the left lower leg with an inverted bottle sign. It is been a major problem controlling the edema in the left leg. The right leg we have had wounds on but not as significant a problem is on the left READMISSION 04/12/2021 Mrs. How is a 76 year old woman we know well in this clinic. She has severe chronic venous insufficiency. 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 I believe she has had bilateral greater saphenous vein ablation in the past as well. She tells me that this wound opened sometime in March. She had a fall and thinks it was initially abrasion. She developed areas she describes as little blisters on the anterior part of her leg and she saw dermatology and was treated for methicillin staph aureus with several rounds of antibiotics. She has been using support stockings on the left leg and says this is the only thing she can get on. Her compression pump use maybe once a day she says if she did not use one she use the other. She comes in today with incredible swelling in the left leg with a wound on the left posterior calf. She has been using Neosporin to this previously a hydrocolloid. 6/15; patient arrives back for 1 week follow-up.Marland Kitchen Apparently her wrap fell down she did not call us to replace this. He has poor edema control. She only uses her compression pumps once a day 6/29; patient presents for 1 week follow-up. She has tolerated the compression wrap well. We have been using Iodoflex under the  wrap. She has no issues or complaints today. 7/6; patient presents for 1 week follow-up. She states that the compression wrap rolled down her leg 4 days ago. She has been trying to keep the area covered but has no dressings at home to use. She denies signs of infection. 7/13; patient presents for 1 week follow-up. She states that her compression wrap rolled down her right leg and she called our office and had it placed a few days ago. She has been tolerating the current wrap well. She states that the Iodoflex is causing a burning sensation. She denies signs of infection. 7/27; patient presents for 1 week follow-up She has tolerated the compression wrap well with Hydrofera Blue underneath. She has now developed a wound to her right lower extremity. She reports having a culture done To this area by her dermatologist that she reports is negative. She currently denies signs of infection. 8/30; patient presents for 1 week follow-up. On the left side she has tolerated the compression wrap well with Hydrofera Blue underneath. She reports some discomfort to her right lower extremity with the 3 layer compression. She currently denies signs of infection. 06/14/2021 upon evaluation today patient's wound actually appears to be doing decently well based on what I am  seeing currently. She actually has 2 areas 1 on the left distal/posterior lower leg and the other on the right lower leg. Subsequently the measurements are roughly the same may be slightly smaller but in general have not made any trend towards getting worse which is great news. 06/23/2021 upon evaluation today patient appears to be doing pretty well in regard to her wounds. On the right she is having difficulty with sciatic pain and this subsequently has led to her taking the wrap off over the last week her leg is more swollen she is also been on prednisone for 10 days. With that being said I think we may want to just use a compression sock on the left  that way she will not have to worry about the compression wrap being in place that she cannot get off. With that being said I do believe as well that the patient is going to need to continue with the wrapping on the left we will also need to do a little bit of sharp debridement today. 8/24; patient presents for 1 week follow-up. She has no issues or complaints today. She denies signs of infection. 8/31; patient presents for 1 week follow-up. She has no issues or complaints today. She has tolerated the compression wrap well on the left lower extremity. She denies signs of infection. 9/7; patient presents for follow-up. She reports that the compression wrap rolled down slightly on her right lower extremity. She reports tenderness to the wound bed. She denies increased warmth or erythema to the surrounding skin. She overall feels well. 9/14; patient presents for follow-up. She has no issues or complaints today. She denies signs of infection. PuraPly is available and patient would like to start this today. 9/21; patient presents for follow-up. She has no issues or complaints today. She tolerated the first skin substitute placement last week under compression. 9/28; patient presents for 1 week follow-up. She has no issues or complaints today. 10/5; patient presents for 1 week follow-up. She reports taking the wrap off yesterday. She was on her feet more yesterday and developed more swelling to her left lower extremity. She denies pain. Overall she is doing well. DANNIKA, HILGEMAN ([id number]) 10/12; patient presents for 1 week follow-up. She has no issues or complaints today. 10/19; patient presents for follow-up. She has no issues or complaints today. She tolerated the compression wrap well. She states she has compression stockings at home. Readmission: 11/27/2021 upon evaluation today patient appears to be doing poorly in regard to the wounds on her right leg. She also has an area of erythema in the  proximal calf area of the right leg which again I do believe is a issue from the standpoint of it being warm and erythematous to touch and also seems to be somewhat localized I really think this may be more of a cellulitis issue than anything else. Fortunately I do not see any signs of active infection Systemically at this time. 12/05/2021 upon evaluation patient's wounds actually appear to be showing some signs of improvement which is not too bad and just 1 week's time. Especially since we had to get on the right antibiotic which is only been for the past 4 days. Fortunately I do not see any signs of active infection locally nor systemically at this time that has gotten any worse. Overall I think that again with the antibiotic she is significantly improved and currently she is taking Levaquin. 12/11/2021 upon evaluation today patient appears to be doing well with  regard to her leg. Between the antibiotics and the triamcinolone I feel like she is doing significantly better which is great news. Overall I am extremely pleased with where we stand. There does not appear to be any signs of active infection locally or systemically at this time. 12/18/2021 upon evaluation today patient appears to be doing well with regard to her wound. I do feel like this is showing signs of some improvement here. Little by little this is clearing up quite nicely. I think it is just a slow process but nonetheless 1 that we are seeing evidence of improvement with regard to. 12/25/2021 upon evaluation today patient appears to be doing well with regard to her wounds she is definitely making some good progress here. Fortunately I do not see any signs of active infection locally or systemically at this time which is great news. No fevers, chills, nausea, vomiting, or diarrhea. 01/02/2022 upon evaluation today patient appears to be doing well with regard to her wound. She has been tolerating the dressing changes without complication.  Fortunately I do not see any evidence of active infection locally or systemically which is great news and overall I am extremely pleased with where we stand today. 01/08/2022 upon evaluation today patient appears to be doing better in regard to her ankle ulcers. She has been tolerating the dressing changes without complication. Fortunately I do not see any signs of infection and both the ankle and leg area is making great progress. 3/13; patient presents for follow-up. She has no issues or complaints today. She was tolerated the compression wrap well. 01/19/2022 upon evaluation today patient appears to be doing well currently in regard to her right leg which overall I think is doing excellent. She had called in to try to get her in sooner for a dressing change due to an issue where her cat scratched her on the left leg. With that being said it does not appear that this was actually a scratch but rather that it was a puncture over a small varicose vein. She had a tremendous amount of bleeding as evidenced by the amount of blood in her shoes currently. The good news is this is stopped currently the bad news is that she needs to be very careful with regard to her cats later as this could definitely be a significant problem for her. Fortunately I do not see any signs of active infection locally nor systemically at this point. 02-08-2022 on evaluation today patient actually appears to be doing significantly better. She was in the hospital due to having stepped on attack on her left heel but nonetheless this is actually showing signs of improvement at this point. I do not see any evidence of active infection locally or systemically which is great news and overall I am extremely pleased with where we stand today. Her right leg is showing no signs of infection. She has been on antibiotics while in the hospital and at discharge. 02-15-2022 upon evaluation today patient appears to be doing better in regard to her  wounds. Overall I feel like we are headed in the right direction. With that being said she does have an area of irritation posteriorly that was not there last week again that makes this wound posterior look little bigger but overall things are significantly better. I am very pleased in that regard. 02-23-2022 upon evaluation today patient appears to be doing well with regard to her wound. It is doing a little bit worse only and the fact that  it is dry other than that things seem to be making good improvements here. There does not appear to be any signs of active infection at this time which is great news. 4/27; right lateral leg 4-5 small open areas with eschar on top. I used a curette to remove this. She has been using Xeroform under 3 layer compression. 03-08-2022 upon evaluation today patient appears to be doing well with regard to the wounds on her lower extremity. She has been tolerating the dressing changes without complication. Fortunately there does not appear to be any signs of active infection at this time which is great news. No fevers, chills, nausea, vomiting, or diarrhea. 03-15-2022 upon evaluation today patient appears to be doing well currently in regard to her wounds. She has been tolerating the dressing changes without complication. Fortunately there does not appear to be any evidence of active infection locally nor systemically at this time which is great news. I do believe she is getting very close to resolution. 03-22-2022 upon evaluation today patient actually appears to be doing awesome in fact she is has 1 very tiny area immediately left open the rest of this appears to be almost completely closed. I do not see any evidence of infection locally or systemically which is great news and overall I am extremely pleased with where things stand at this point. I do think we are getting very close to complete resolution. 03-29-2022 upon evaluation today patient appears to be doing well  currently in regard to her wound. In fact this appears to be almost completely healed. I think that if she still continues to have no significant drainage come next week that we will probably get a go ahead and heal her out at that point. 04-05-2022 upon evaluation today patient appears to be doing well with regard to her wounds which pretty much looking to be completely closed. With that being said she does have a very tiny area still medial that appears to be leaking a little bit her legs really swollen as she had a wrap Olejnik, Dixon. ([id number]) slipped down over the nighttime and this subsequently has been off all day meaning her leg is much more swollen than it was last week. Subsequently I am going to go and see what I can do about clearing away some of the necrotic debris here. 04-11-2022 upon evaluation today patient's wound actually is really almost nonexistent there is definitely some evidence of drainage but really I do not see anything significantly open which is great news. Fortunately I do not believe that she is showing any signs of active infection right now which is great her swelling is significantly down. 04-19-2022 upon evaluation today patient appears to be doing well with regard to her wound. There does not appear to be any signs of active infection locally or systemically which is great news and overall I am extremely pleased with where we stand at this point. Objective Constitutional [name] and well-hydrated in no acute distress. Vitals Time Taken: 12:58 PM, Height: 62 in, Weight: 199 lbs, BMI: 36.4, Temperature: 97.7 F, Pulse: 71 bpm, Respiratory Rate: 18 breaths/min, Blood Pressure: 145/78 mmHg. Respiratory normal breathing without difficulty. Psychiatric this patient is able to make decisions and demonstrates good insight into disease process. Alert and Oriented x 3. pleasant and cooperative. General Notes: Upon inspection patient's wound bed showed signs of  good granulation and epithelization at this time. Overall I am extremely happy with the fact that she is completely healed based on  what I see I do think we will put a piece of Tubigrip on today she has her compression socks she will begin wearing tomorrow. Integumentary (Hair, Skin) Wound #16 status is Healed - Epithelialized. Original cause of wound was Gradually Appeared. The date acquired was: 10/26/2021. The wound has been in treatment 16 weeks. The wound is located on the Right,Medial Lower Leg. The wound measures 0cm length x 0cm width x 0cm depth; 0cm^2 area and 0cm^3 volume. There is no tunneling or undermining noted. There is a medium amount of serosanguineous drainage noted. There is no granulation within the wound bed. There is no necrotic tissue within the wound bed. Assessment Active Problems ICD-10 Lymphedema, not elsewhere classified Chronic venous hypertension (idiopathic) with ulcer and inflammation of right lower extremity Non-pressure chronic ulcer of other part of right lower leg limited to breakdown of skin Plan Discharge From [facility]: Discharge from [facility] - please call if any issues arise Wear compression garments daily. Put garments on first thing when you wake up and remove them before bed. Moisturize legs daily after removing compression garments. Elevate, Exercise Daily and Avoid Standing for Long Periods of Time. DO YOUR BEST to sleep in the bed at night. DO NOT sleep in your recliner. Long hours of sitting in a recliner leads to swelling of the legs and/or potential wounds on your backside. JANN, MILKOVICH ([id number]) 1. I am going to recommend currently that we go ahead and continue with the recommendation for having the patient use her own compression sock at this point going forward. I think this is good to be the appropriate way to go and it should do quite well for her. 2. I am also can recommend that we have the patient  continue to monitor for any signs of worsening or infection if anything changes or any wounds open she should let me know otherwise I think if she maintains her compression therapy she will be just fine. See her back for follow-up visit as needed. Electronic Signature(s) Signed: 04/19/2022 1:47:28 PM By: Worthy Keeler PA-C Entered By: Worthy Keeler on 04/19/2022 13:47:28 Messing, [name] Child ([id number]) -------------------------------------------------------------------------------- SuperBill Details Patient Name: ZARAY, GATCHEL. Date of Service: 04/19/2022 Medical Record Number: [id number] Patient Account Number: [id number] Date of Birth/Sex: 02-28-1946 (76 y.o. F) Treating RN: Levora Dredge Primary Care Provider: [name] Other Clinician: Referring Provider: [name] Treating Provider/Extender: [name] in Treatment: 20 Diagnosis Coding ICD-10 Codes Code Description I89.0 Lymphedema, not elsewhere classified I87.331 Chronic venous hypertension (idiopathic) with ulcer and inflammation of right lower extremity L97.811 Non-pressure chronic ulcer of other part of right lower leg limited to breakdown of skin Facility Procedures CPT4 Code: [id number] Description: [phone] - WOUND CARE VISIT-LEV 2 EST PT Modifier: Quantity: 1 Physician Procedures CPT4 Code Description: [id number] - WC PHYS LEVEL 3 - EST PT Modifier: Quantity: 1 CPT4 Code Description: ICD-10 Diagnosis Description I89.0 Lymphedema, not elsewhere classified I87.331 Chronic venous hypertension (idiopathic) with ulcer and inflammation of rig L97.811 Non-pressure chronic ulcer of other part of right lower leg limited  to brea Modifier: ht lower extremit kdown of skin Quantity: y Engineer, maintenance) Signed: 04/19/2022 1:48:10 PM By: Worthy Keeler PA-C Entered By: Worthy Keeler on 04/19/2022 13:48:09

## 2022-04-20 ENCOUNTER — Ambulatory Visit (INDEPENDENT_AMBULATORY_CARE_PROVIDER_SITE_OTHER)
Admission: RE | Admit: 2022-04-20 | Discharge: 2022-04-20 | Disposition: A | Discharge status: Home / Self Care | Payer: Medicare Other | Source: Ambulatory Visit | Attending: Nurse Practitioner | Admitting: Nurse Practitioner

## 2022-04-20 ENCOUNTER — Ambulatory Visit (INDEPENDENT_AMBULATORY_CARE_PROVIDER_SITE_OTHER): Payer: Medicare Other | Admitting: Nurse Practitioner

## 2022-04-20 VITALS — BP 140/82 | HR 75 | Temp 97.2°F | Ht 61.0 in | Wt 191.8 lb

## 2022-04-20 DIAGNOSIS — K529 Noninfective gastroenteritis and colitis, unspecified: Secondary | ICD-10-CM | POA: Insufficient documentation

## 2022-04-20 DIAGNOSIS — R197 Diarrhea, unspecified: Secondary | ICD-10-CM | POA: Diagnosis not present

## 2022-04-20 NOTE — Patient Instructions (Signed)
Nice to see you today I will be in touch with the labs and xray results once I have them Follow up if no improvement  

## 2022-04-20 NOTE — Progress Notes (Signed)
   Acute Office Visit  Subjective:     Patient ID: [name], female    DOB: [birth year], 76 y.o.   MRN: [id number]  Chief Complaint  Patient presents with   Diarrhea    Pt c/o recurring diarrhea x1 mth     Patient is in today for Diarrhea  States that it started approx 1 month ago. States it started with several episodes. States that it has increased since onset. States that the end of march and early April in the hosptial for IV abx due to cellulits. D/C first week of April.   She has tried gas x with out good relief.  No blod in stool. Rabbit pellets in the diarrhea.   Review of Systems  Constitutional:  Negative for chills and fever.  Gastrointestinal:  Positive for diarrhea. Negative for abdominal pain, nausea and vomiting.        Objective:    BP 140/82   Pulse 75   Temp (!) 97.2 F (36.2 C) (Temporal)   Ht '5\' 1"'$  (1.549 m)   Wt 191 lb 12.8 oz (87 kg)   SpO2 96%   BMI 36.24 kg/m    Physical Exam Vitals and nursing note reviewed.  Constitutional:      Appearance: Normal appearance. She is obese.  Cardiovascular:     Rate and Rhythm: Normal rate and regular rhythm.     Heart sounds: Normal heart sounds.  Pulmonary:     Effort: Pulmonary effort is normal.     Breath sounds: Normal breath sounds.  Abdominal:     General: Bowel sounds are normal. There is no distension.     Palpations: There is no mass.     Tenderness: There is no abdominal tenderness.     Hernia: No hernia is present.  Neurological:     Mental Status: She is alert.     No results found for any visits on 04/20/22.      Assessment & Plan:   Problem List Items Addressed This Visit       Digestive   Diarrhea of presumed infectious origin - Primary    Patient recently on several antibiotics due to cellulitis.  We will send off for C. difficile and gastrointestinal PCR panel.  Given patient is having 7 episodes of diarrhea we will check basic lab work including potassium.  Patient  states she has trouble with hypokalemia as this.  Patient also states she is having "rabbit pellets" in the stool we will do a flatplate abdomen to rule out megacolon versus watery stools try to pass around constipation.      Relevant Orders   DG Abd 2 Views   CBC   C. difficile GDH and Toxin A/B   Gastrointestinal Pathogen Pnl RT, PCR   COMPLETE METABOLIC PANEL WITH GFR    No orders of the defined types were placed in this encounter.   Return if symptoms worsen or fail to improve.  Romilda Garret, NP

## 2022-04-20 NOTE — Assessment & Plan Note (Signed)
Patient recently on several antibiotics due to cellulitis.  We will send off for C. difficile and gastrointestinal PCR panel.  Given patient is having 7 episodes of diarrhea we will check basic lab work including potassium.  Patient states she has trouble with hypokalemia as this.  Patient also states she is having "rabbit pellets" in the stool we will do a flatplate abdomen to rule out megacolon versus watery stools try to pass around constipation.

## 2022-04-21 LAB — COMPLETE METABOLIC PANEL WITH GFR
AG Ratio: 1.3 (calc) (ref 1.0–2.5)
ALT: 18 U/L (ref 6–29)
AST: 11 U/L (ref 10–35)
Albumin: 3.7 g/dL (ref 3.6–5.1)
Alkaline phosphatase (APISO): 53 U/L (ref 37–153)
BUN: 22 mg/dL (ref 7–25)
CO2: 24 mmol/L (ref 20–32)
Calcium: 9.6 mg/dL (ref 8.6–10.4)
Chloride: 105 mmol/L (ref 98–110)
Creat: 0.79 mg/dL (ref 0.60–1.00)
Globulin: 2.8 g/dL (calc) (ref 1.9–3.7)
Glucose, Bld: 82 mg/dL (ref 65–99)
Potassium: 4.3 mmol/L (ref 3.5–5.3)
Sodium: 141 mmol/L (ref 135–146)
Total Bilirubin: 0.4 mg/dL (ref 0.2–1.2)
Total Protein: 6.5 g/dL (ref 6.1–8.1)
eGFR: 78 mL/min/{1.73_m2} (ref >=60)

## 2022-04-21 LAB — CBC
HCT: 38.2 % (ref 35.0–45.0)
Hemoglobin: 12.4 g/dL (ref 11.7–15.5)
MCH: 28.6 pg (ref 27.0–33.0)
MCHC: 32.5 g/dL (ref 32.0–36.0)
MCV: 88.2 fL (ref 80.0–100.0)
MPV: 9.7 fL (ref 7.5–12.5)
Platelets: 206 10*3/uL (ref 140–400)
RBC: 4.33 10*6/uL (ref 3.80–5.10)
RDW: 13.6 % (ref 11.0–15.0)
WBC: 5.1 10*3/uL (ref 3.8–10.8)

## 2022-05-16 ENCOUNTER — Ambulatory Visit (INDEPENDENT_AMBULATORY_CARE_PROVIDER_SITE_OTHER): Payer: Medicare Other | Admitting: Family Medicine

## 2022-05-16 ENCOUNTER — Encounter: Payer: Self-pay | Admitting: Family Medicine

## 2022-05-16 VITALS — BP 134/72 | HR 58 | Temp 97.4°F | Ht 61.0 in | Wt 190.5 lb

## 2022-05-16 DIAGNOSIS — G902 Horner's syndrome: Secondary | ICD-10-CM

## 2022-05-16 DIAGNOSIS — G4733 Obstructive sleep apnea (adult) (pediatric): Secondary | ICD-10-CM

## 2022-05-16 DIAGNOSIS — G3184 Mild cognitive impairment, so stated: Secondary | ICD-10-CM | POA: Diagnosis not present

## 2022-05-16 DIAGNOSIS — T6591XA Toxic effect of unspecified substance, accidental (unintentional), initial encounter: Secondary | ICD-10-CM

## 2022-05-16 DIAGNOSIS — R4189 Other symptoms and signs involving cognitive functions and awareness: Secondary | ICD-10-CM

## 2022-05-16 DIAGNOSIS — Z9989 Dependence on other enabling machines and devices: Secondary | ICD-10-CM | POA: Diagnosis not present

## 2022-05-16 DIAGNOSIS — I872 Venous insufficiency (chronic) (peripheral): Secondary | ICD-10-CM

## 2022-05-16 DIAGNOSIS — H547 Unspecified visual loss: Secondary | ICD-10-CM

## 2022-05-16 DIAGNOSIS — M79672 Pain in left foot: Secondary | ICD-10-CM | POA: Diagnosis not present

## 2022-05-16 DIAGNOSIS — R634 Abnormal weight loss: Secondary | ICD-10-CM | POA: Diagnosis not present

## 2022-05-16 NOTE — Progress Notes (Unsigned)
Patient ID: [name], female    DOB: [name] 08, 1946, 76 y.o.   MRN: [id number]  This visit was conducted in person.  BP 134/72   Pulse (!) 58   Temp (!) 97.4 F (36.3 C) (Temporal)   Ht '5\' 1"'  (1.549 m)   Wt 190 lb 8 oz (86.4 kg)   SpO2 96%   BMI 35.99 kg/m    CC: 3 mo f/u visit Subjective:   HPI: [name] is a 76 y.o. female presenting on 05/16/2022 for Follow-up (Here for 3 mo f/u.)   L handed.   Hospitalization 01/2022 for L leg/foot cellulitis, ultimately treated with keflex 1 wk course. Saw wound care clinic until last month.  She has been cleaning out her house.  Notes discomfort to bottom of left foot.   Continues Gas-X with benefit.   Continues lasix + potassium 89mq BID.   Saw Dr CBilley Gosling3/2023 - MCI dx, s/p neuropsychological evaluation 02/2022 which was reviewed rec neuroopthalmology eval, CPAP update, rec cutting down on driving esp at night time, rec assistance with med management, rec rpt testing in 1-1.5 yrs.    She was seeing ophthalmologist Dr SKathrin Pennerwho's since retired. Requests new referral to ophtho ([place]).      Relevant past medical, surgical, family and social history reviewed and updated as indicated. Interim medical history since our last visit reviewed. Allergies and medications reviewed and updated. Outpatient Medications Prior to Visit  Medication Sig Dispense Refill   acetaminophen (TYLENOL) 650 MG CR tablet Take 2 tablets (1,300 mg total) by mouth 2 (two) times daily as needed for pain.     albuterol (VENTOLIN HFA) 108 (90 Base) MCG/ACT inhaler INHALE 2 PUFFS BY MOUTH EVERY 6 HOURS AS NEEDED FOR WHEEZING AND SHORTNESS OF BREATH 8.5 each 0   aspirin EC 81 MG tablet Take 81 mg by mouth at bedtime. Melanoma prevention     furosemide (LASIX) 40 MG tablet Take 1 tablet (40 mg total) by mouth daily. Take second dose if needed for leg swelling/weight gain 60 tablet 2   KLOR-CON M20 20 MEQ tablet TAKE 1 TABLET BY MOUTH TWICE A DAY 60 tablet 3    loratadine (CLARITIN) 10 MG tablet Take 10 mg by mouth daily.     losartan (COZAAR) 50 MG tablet TAKE 1 TABLET BY MOUTH EVERY DAY 90 tablet 0   montelukast (SINGULAIR) 10 MG tablet TAKE 1 TABLET BY MOUTH EVERY DAY 90 tablet 0   Multiple Vitamin (MULTIVITAMIN WITH MINERALS) TABS tablet Take 1 tablet by mouth daily.     Polyvinyl Alcohol-Povidone (TEARS PLUS OP) Place 1 drop into both eyes daily as needed (dry eyes/ redness/ burning).      PRESCRIPTION MEDICATION CPAP     Probiotic Product (PROBIOTIC DAILY PO) Take 1 tablet by mouth daily.      triamcinolone (NASACORT) 55 MCG/ACT AERO nasal inhaler Place 2 sprays into the nose daily. In each nostril     Vitamin D, Ergocalciferol, (DRISDOL) 1.25 MG (50000 UNIT) CAPS capsule TAKE 1 CAPSULE BY MOUTH ONE TIME PER WEEK 12 capsule 0   No facility-administered medications prior to visit.     Per HPI unless specifically indicated in ROS section below Review of Systems  Objective:  BP 134/72   Pulse (!) 58   Temp (!) 97.4 F (36.3 C) (Temporal)   Ht '5\' 1"'  (1.549 m)   Wt 190 lb 8 oz (86.4 kg)   SpO2 96%   BMI 35.99 kg/m  Wt Readings from Last 3 Encounters:  05/16/22 190 lb 8 oz (86.4 kg)  04/20/22 191 lb 12.8 oz (87 kg)  02/14/22 182 lb 8 oz (82.8 kg)      Physical Exam    Results for orders placed or performed in visit on 04/20/22  CBC  Result Value Ref Range   WBC 5.1 3.8 - 10.8 Thousand/uL   RBC 4.33 3.80 - 5.10 Million/uL   Hemoglobin 12.4 11.7 - 15.5 g/dL   HCT 38.2 35.0 - 45.0 %   MCV 88.2 80.0 - 100.0 fL   MCH 28.6 27.0 - 33.0 pg   MCHC 32.5 32.0 - 36.0 g/dL   RDW 13.6 11.0 - 15.0 %   Platelets 206 140 - 400 Thousand/uL   MPV 9.7 7.5 - 12.5 fL  COMPLETE METABOLIC PANEL WITH GFR  Result Value Ref Range   Glucose, Bld 82 65 - 99 mg/dL   BUN 22 7 - 25 mg/dL   Creat 0.79 0.60 - 1.00 mg/dL   eGFR 78 > OR = 60 mL/min/1.26m   BUN/Creatinine Ratio NOT APPLICABLE 6 - 22 (calc)   Sodium 141 135 - 146 mmol/L   Potassium  4.3 3.5 - 5.3 mmol/L   Chloride 105 98 - 110 mmol/L   CO2 24 20 - 32 mmol/L   Calcium 9.6 8.6 - 10.4 mg/dL   Total Protein 6.5 6.1 - 8.1 g/dL   Albumin 3.7 3.6 - 5.1 g/dL   Globulin 2.8 1.9 - 3.7 g/dL (calc)   AG Ratio 1.3 1.0 - 2.5 (calc)   Total Bilirubin 0.4 0.2 - 1.2 mg/dL   Alkaline phosphatase (APISO) 53 37 - 153 U/L   AST 11 10 - 35 U/L   ALT 18 6 - 29 U/L   *Note: Due to a large number of results and/or encounters for the requested time period, some results have not been displayed. A complete set of results can be found in Results Review.    Assessment & Plan:   Problem List Items Addressed This Visit   None    No orders of the defined types were placed in this encounter.  No orders of the defined types were placed in this encounter.    Patient Instructions  We will refer you to eye doctor ([facility].  Good to see you today.  Schedule labs and physical for December with me.  Use corn pad to left lateral sole, touch base with podiatry if ongoing pain.   Follow up plan: Return if symptoms worsen or fail to improve.  JRia Bush MD

## 2022-05-16 NOTE — Patient Instructions (Addendum)
We will refer you to eye doctor [facility]).  Good to see you today.  Schedule labs and physical for December with me.  Use corn pad to left lateral sole, touch base with podiatry if ongoing pain.

## 2022-05-17 ENCOUNTER — Encounter: Payer: Self-pay | Admitting: Pulmonary Disease

## 2022-05-17 ENCOUNTER — Encounter: Payer: Self-pay | Admitting: Family Medicine

## 2022-05-17 ENCOUNTER — Ambulatory Visit (INDEPENDENT_AMBULATORY_CARE_PROVIDER_SITE_OTHER): Payer: Medicare Other | Admitting: Pulmonary Disease

## 2022-05-17 VITALS — BP 154/80 | HR 78 | Temp 97.8°F | Ht 63.0 in | Wt 192.4 lb

## 2022-05-17 DIAGNOSIS — M79672 Pain in left foot: Secondary | ICD-10-CM | POA: Insufficient documentation

## 2022-05-17 DIAGNOSIS — H547 Unspecified visual loss: Secondary | ICD-10-CM | POA: Insufficient documentation

## 2022-05-17 DIAGNOSIS — J4520 Mild intermittent asthma, uncomplicated: Secondary | ICD-10-CM | POA: Diagnosis not present

## 2022-05-17 DIAGNOSIS — G4733 Obstructive sleep apnea (adult) (pediatric): Secondary | ICD-10-CM

## 2022-05-17 DIAGNOSIS — Z9989 Dependence on other enabling machines and devices: Secondary | ICD-10-CM | POA: Diagnosis not present

## 2022-05-17 NOTE — Assessment & Plan Note (Signed)
May be due for updated CPAP - has upcoming pulm appt.

## 2022-05-17 NOTE — Assessment & Plan Note (Signed)
H/o this.  ?

## 2022-05-17 NOTE — Assessment & Plan Note (Signed)
See below.  Overall improved.

## 2022-05-17 NOTE — Patient Instructions (Signed)
   X CPAP supplies will be renewed for a year

## 2022-05-17 NOTE — Assessment & Plan Note (Signed)
This is stabilizing - 10 lb weight gain noted.

## 2022-05-17 NOTE — Progress Notes (Signed)
   Subjective:    Patient ID: [name], female    DOB: 1946/02/19, 76 y.o.   MRN: [id number]  HPI  76 yo woman for followup of severe OSA & asthma  She has insomnia and delayed phase circadian rhythm disorder  She has a dozen cats at home and a litter of kittens  Moved from [place] 2010 - she was diagnosed with asthma in her 41s and took Advair for a long period of time.   PMH -  treated for melanoma from 2011-2013 requiring interferon therapy.  - Cervical disc disease and underwent fusion  She took neurotin for RLS for years   developed lymphedema after cervical fusion surgery. 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  She was discharged to rehab and then to her sister's home who lives right across her home.  She had developed memory issues but this was found to be related to psychotropic medications and hypokalemia.  On her last visit we had prescribed a new CPAP machine which has been delivered but she has not started using this yet since she is living in her sister's house. She is sleeping in a recliner in the garage.  Her own house was taken over by cats in her absence She denies any problems with mask or pressure  She denies nocturnal wheezing, has rarely needed albuterol   Significant tests/ events reviewed PSG 2003 which showed AHI of 81 events per hour and lowest desaturation to 57%. This was corrected by CPAP of 12 cm in with residual events of 5.6 events per hour on a study in 2007. CPAP 11/05/13 to 03/24/14 >> average 4 hrs and 36 min. Average AHI is 6.5 with median CPAP 12 cm H2O.   PFTs 04/2014- no airway obs, ratio ok, FEV1 80%, TLC & DLCO nml    Review of Systems neg for any significant sore throat, dysphagia, itching, sneezing, nasal congestion or excess/ purulent secretions, fever, chills, sweats, unintended wt loss, pleuritic or exertional cp,  hempoptysis, orthopnea pnd or change in chronic leg swelling. Also denies presyncope, palpitations, heartburn, abdominal pain, nausea, vomiting, diarrhea or change in bowel or urinary habits, dysuria,hematuria, rash, arthralgias, visual complaints, headache, numbness weakness or ataxia.     Objective:   Physical Exam  Gen. Pleasant, obese, in no distress ENT - no lesions, no post nasal drip Neck: No JVD, no thyromegaly, no carotid bruits Lungs: no use of accessory muscles, no dullness to percussion, decreased without rales or rhonchi  Cardiovascular: Rhythm regular, heart sounds  normal, no murmurs or gallops, no peripheral edema Musculoskeletal: No deformities, no cyanosis or clubbing , no tremors       Assessment & Plan:

## 2022-05-17 NOTE — Assessment & Plan Note (Signed)
Anticipate tender corn - discussed corn pad and f/u with podiatry if not improved.

## 2022-05-17 NOTE — Assessment & Plan Note (Addendum)
Recent evaluation by psychology Dr Eliezer Lofts [place], note reviewed and sent for scanning.  Dx mild neurocognitive disorder, possibly medication-induced vs cerebrovascular disease vs other etiology, without behavioral disturbance.  Reviewed recommendations with patient, questions answered.  She requests referral to new ophthalmologist - referral placed.

## 2022-05-17 NOTE — Assessment & Plan Note (Signed)
Endorses h/o cataracts as well as vision changes and her previous eye doctor has retired - will refer to [facility].

## 2022-05-18 NOTE — Assessment & Plan Note (Signed)
CPAP download was reviewed which shows excellent control of events on 12 cm with large leak.  She is very compliant averages more than 4 hours per night and CPAP is only helping for daytime somnolence and fatigue. She has a new CPAP already delivered and will start using this once her home situation improves  Weight loss encouraged, compliance with goal of at least 4-6 hrs every night is the expectation. Advised against medications with sedative side effects Cautioned against driving when sleepy - understanding that sleepiness will vary on a day to day basis

## 2022-05-18 NOTE — Assessment & Plan Note (Signed)
Well-controlled, appears to be mild intermittent -Triggered mainly by reflux and allergies

## 2022-05-23 ENCOUNTER — Other Ambulatory Visit: Payer: Self-pay | Admitting: Family Medicine

## 2022-05-23 ENCOUNTER — Telehealth: Payer: Self-pay | Admitting: Pulmonary Disease

## 2022-05-23 NOTE — Telephone Encounter (Signed)
Patient has not been using her new Cpap machine due to sleeping at her sisters, she is still using the old machine. [facility] is picking up the new machine on Friday due non-use. Patient states the only way to get it back is to do a new sleep study. Patient states she should be sleeping back at her house in about 2 weeks. If she has to do a new study she would prefer to do it in lab, not [facility].  Please advise, call back [phone].

## 2022-05-23 NOTE — Telephone Encounter (Signed)
Patient has not been using her new Cpap machine due to sleeping at her sisters, she is still using the old machine. [facility] is picking up the new machine on Friday due non-use. Patient states the only way to get it back is to do a new sleep study. Patient states she should be sleeping back at her house in about 2 weeks. If she has to do a new study she would prefer to do it in lab, not [facility].    Please advise sir

## 2022-05-24 NOTE — Telephone Encounter (Signed)
ATC patient. LVMTCB. 

## 2022-05-28 ENCOUNTER — Telehealth: Payer: Self-pay

## 2022-05-28 NOTE — Telephone Encounter (Signed)
Left message for pt to call office back regarding appt today at 1:50 pm for AUB.

## 2022-06-04 ENCOUNTER — Telehealth: Payer: Self-pay

## 2022-06-04 NOTE — Telephone Encounter (Signed)
Left message for pt to call office back regarding sooner appointment on 06/05/22.

## 2022-06-07 ENCOUNTER — Ambulatory Visit: Payer: Medicare Other | Admitting: Podiatry

## 2022-06-15 ENCOUNTER — Other Ambulatory Visit: Payer: Self-pay | Admitting: Family Medicine

## 2022-06-18 ENCOUNTER — Other Ambulatory Visit: Payer: Self-pay | Admitting: Family Medicine

## 2022-07-05 ENCOUNTER — Ambulatory Visit: Payer: Medicare Other | Admitting: Podiatry

## 2022-07-06 ENCOUNTER — Ambulatory Visit (INDEPENDENT_AMBULATORY_CARE_PROVIDER_SITE_OTHER): Payer: Medicare Other | Admitting: Family Medicine

## 2022-07-06 ENCOUNTER — Encounter: Payer: Self-pay | Admitting: Family Medicine

## 2022-07-06 VITALS — BP 132/70 | HR 75 | Temp 97.7°F | Ht 63.0 in | Wt 195.1 lb

## 2022-07-06 DIAGNOSIS — L97929 Non-pressure chronic ulcer of unspecified part of left lower leg with unspecified severity: Secondary | ICD-10-CM

## 2022-07-06 DIAGNOSIS — R600 Localized edema: Secondary | ICD-10-CM | POA: Diagnosis not present

## 2022-07-06 DIAGNOSIS — I83029 Varicose veins of left lower extremity with ulcer of unspecified site: Secondary | ICD-10-CM

## 2022-07-06 DIAGNOSIS — R197 Diarrhea, unspecified: Secondary | ICD-10-CM

## 2022-07-06 DIAGNOSIS — I872 Venous insufficiency (chronic) (peripheral): Secondary | ICD-10-CM | POA: Diagnosis not present

## 2022-07-06 DIAGNOSIS — I83892 Varicose veins of left lower extremities with other complications: Secondary | ICD-10-CM | POA: Diagnosis not present

## 2022-07-06 NOTE — Progress Notes (Unsigned)
Patient ID: [name], female    DOB: [birth year], 76 y.o.   MRN: [id number]  This visit was conducted in person.  BP 132/70   Pulse 75   Temp 97.7 F (36.5 C) (Temporal)   Ht '5\' 3"'  (1.6 m)   Wt 195 lb 2 oz (88.5 kg)   SpO2 95%   BMI 34.56 kg/m    CC: I still have diarrhea Subjective:   HPI: [name] is a 76 y.o. female presenting on 07/06/2022 for Diarrhea (C/o reoccurring diarrhea.  Also, wants wound on L leg checked. )   Intermitting diarrhea since May - this started after multiple antibiotics.  Bad smelling, yellowish brown color, can be watery.  When last seen 05/2022, this was improving so we deferred stool sample. However she notes it's progressively worsening.  Today she had bowel accident while asleep. She wears poise pads.  No relations to meals/food.  No fevers/chills, abd pain, nausea/vomiting, blood in stool, dyspnea, dizziness or chest pain.   She is not happy in her home situation. Still living with sister, has been unable to return to her home.  Lives with 25 cats. Still working on getting males neutered.      Relevant past medical, surgical, family and social history reviewed and updated as indicated. Interim medical history since our last visit reviewed. Allergies and medications reviewed and updated. Outpatient Medications Prior to Visit  Medication Sig Dispense Refill   acetaminophen (TYLENOL) 650 MG CR tablet Take 2 tablets (1,300 mg total) by mouth 2 (two) times daily as needed for pain.     albuterol (VENTOLIN HFA) 108 (90 Base) MCG/ACT inhaler INHALE 2 PUFFS BY MOUTH EVERY 6 HOURS AS NEEDED FOR WHEEZING AND SHORTNESS OF BREATH 8.5 each 0   aspirin EC 81 MG tablet Take 81 mg by mouth at bedtime. Melanoma prevention     furosemide (LASIX) 40 MG tablet TAKE 1 TABLET (40 MG TOTAL) BY MOUTH DAILY. TAKE SECOND DOSE IF NEEDED FOR LEG SWELLING/WEIGHT GAIN 60 tablet 2   KLOR-CON M20 20 MEQ tablet TAKE 1 TABLET BY MOUTH TWICE A DAY 60 tablet 4    loratadine (CLARITIN) 10 MG tablet Take 10 mg by mouth daily.     losartan (COZAAR) 50 MG tablet TAKE 1 TABLET BY MOUTH EVERY DAY 90 tablet 1   montelukast (SINGULAIR) 10 MG tablet TAKE 1 TABLET BY MOUTH EVERY DAY 90 tablet 1   Multiple Vitamin (MULTIVITAMIN WITH MINERALS) TABS tablet Take 1 tablet by mouth daily.     Polyvinyl Alcohol-Povidone (TEARS PLUS OP) Place 1 drop into both eyes daily as needed (dry eyes/ redness/ burning).      PRESCRIPTION MEDICATION CPAP     Probiotic Product (PROBIOTIC DAILY PO) Take 1 tablet by mouth daily.      triamcinolone (NASACORT) 55 MCG/ACT AERO nasal inhaler Place 2 sprays into the nose daily. In each nostril     Vitamin D, Ergocalciferol, (DRISDOL) 1.25 MG (50000 UNIT) CAPS capsule TAKE 1 CAPSULE BY MOUTH ONCE A WEEK 12 capsule 1   No facility-administered medications prior to visit.     Per HPI unless specifically indicated in ROS section below Review of Systems  Objective:  BP 132/70   Pulse 75   Temp 97.7 F (36.5 C) (Temporal)   Ht '5\' 3"'  (1.6 m)   Wt 195 lb 2 oz (88.5 kg)   SpO2 95%   BMI 34.56 kg/m   Wt Readings from Last 3 Encounters:  07/06/22 195 lb 2 oz (88.5 kg)  05/17/22 192 lb 6.4 oz (87.3 kg)  05/16/22 190 lb 8 oz (86.4 kg)      Physical Exam Vitals and nursing note reviewed.  Constitutional:      Appearance: Normal appearance. She is obese. She is not ill-appearing.  Neck:     Comments: Chronic kyphosis Cardiovascular:     Rate and Rhythm: Normal rate and regular rhythm.     Pulses: Normal pulses.     Heart sounds: Murmur (2/6 systolic) heard.  Pulmonary:     Effort: Pulmonary effort is normal. No respiratory distress.     Breath sounds: Normal breath sounds. No wheezing, rhonchi or rales.  Abdominal:     General: Bowel sounds are normal. There is no distension.     Palpations: Abdomen is soft. There is no mass.     Tenderness: There is no abdominal tenderness. There is no guarding or rebound.     Hernia: No hernia  is present.  Musculoskeletal:     Right lower leg: No edema.     Left lower leg: No edema.  Skin:    General: Skin is warm and dry.     Findings: No erythema or rash.          Comments: R medial lower leg with ~1cm scab vs eschar without surrounding erythema, dressed with triple abx ointment and covered with bandaid  Neurological:     Mental Status: She is alert.  Psychiatric:        Mood and Affect: Mood normal.        Behavior: Behavior normal.       Results for orders placed or performed in visit on 04/20/22  CBC  Result Value Ref Range   WBC 5.1 3.8 - 10.8 Thousand/uL   RBC 4.33 3.80 - 5.10 Million/uL   Hemoglobin 12.4 11.7 - 15.5 g/dL   HCT 38.2 35.0 - 45.0 %   MCV 88.2 80.0 - 100.0 fL   MCH 28.6 27.0 - 33.0 pg   MCHC 32.5 32.0 - 36.0 g/dL   RDW 13.6 11.0 - 15.0 %   Platelets 206 140 - 400 Thousand/uL   MPV 9.7 7.5 - 12.5 fL  COMPLETE METABOLIC PANEL WITH GFR  Result Value Ref Range   Glucose, Bld 82 65 - 99 mg/dL   BUN 22 7 - 25 mg/dL   Creat 0.79 0.60 - 1.00 mg/dL   eGFR 78 > OR = 60 mL/min/1.85m   BUN/Creatinine Ratio NOT APPLICABLE 6 - 22 (calc)   Sodium 141 135 - 146 mmol/L   Potassium 4.3 3.5 - 5.3 mmol/L   Chloride 105 98 - 110 mmol/L   CO2 24 20 - 32 mmol/L   Calcium 9.6 8.6 - 10.4 mg/dL   Total Protein 6.5 6.1 - 8.1 g/dL   Albumin 3.7 3.6 - 5.1 g/dL   Globulin 2.8 1.9 - 3.7 g/dL (calc)   AG Ratio 1.3 1.0 - 2.5 (calc)   Total Bilirubin 0.4 0.2 - 1.2 mg/dL   Alkaline phosphatase (APISO) 53 37 - 153 U/L   AST 11 10 - 35 U/L   ALT 18 6 - 29 U/L   *Note: Due to a large number of results and/or encounters for the requested time period, some results have not been displayed. A complete set of results can be found in Results Review.    Assessment & Plan:   Problem List Items Addressed This Visit   None  No orders of the defined types were placed in this encounter.  No orders of the defined types were placed in this encounter.    Patient  Instructions  Pass by lab to pick up 2 stool kits for infection. We will be in touch with results.  Continue dressing change with antibiotic ointment daily.  Watch for streaking redness or draining.   Follow up plan: No follow-ups on file.  [name], MD

## 2022-07-06 NOTE — Patient Instructions (Addendum)
Pass by lab to pick up 2 stool kits for infection. We will be in touch with results.  Continue dressing change with antibiotic ointment daily.  Watch for streaking redness or draining.

## 2022-07-07 NOTE — Assessment & Plan Note (Addendum)
Ongoing diarrhea for 3+ months, waxes and wanes. Last visit it was improving, but now notes again worsening. Recently with episode of bowel incontinence. This started after several antibiotic courses for cellulitis. Will need to r/o infectious cause - I've sent her home with testing for C diff and GI pathogen panel. Results will guide treatment.  Encouraged good hydration status.  H/o sulfa, doxy allergy

## 2022-07-07 NOTE — Assessment & Plan Note (Signed)
Anticipate venous stasis ulcer. ?eschar vs scab. rec daily dressing change with triple abx ointment, update if streaking redness or increased drainage or other signs of infection.

## 2022-07-13 ENCOUNTER — Ambulatory Visit (INDEPENDENT_AMBULATORY_CARE_PROVIDER_SITE_OTHER): Payer: Medicare Other | Admitting: Family Medicine

## 2022-07-13 ENCOUNTER — Encounter: Payer: Self-pay | Admitting: Family Medicine

## 2022-07-13 VITALS — BP 132/70 | HR 83 | Temp 97.5°F | Ht 63.0 in | Wt 196.2 lb

## 2022-07-13 DIAGNOSIS — Z23 Encounter for immunization: Secondary | ICD-10-CM

## 2022-07-13 DIAGNOSIS — L97929 Non-pressure chronic ulcer of unspecified part of left lower leg with unspecified severity: Secondary | ICD-10-CM

## 2022-07-13 DIAGNOSIS — I83029 Varicose veins of left lower extremity with ulcer of unspecified site: Secondary | ICD-10-CM

## 2022-07-13 DIAGNOSIS — R600 Localized edema: Secondary | ICD-10-CM

## 2022-07-13 DIAGNOSIS — I83892 Varicose veins of left lower extremities with other complications: Secondary | ICD-10-CM | POA: Diagnosis not present

## 2022-07-13 DIAGNOSIS — R197 Diarrhea, unspecified: Secondary | ICD-10-CM | POA: Diagnosis not present

## 2022-07-13 MED ORDER — CEPHALEXIN 500 MG PO CAPS: 500.0000 mg | ORAL_CAPSULE | Freq: Four times a day (QID) | ORAL | 0 refills | Status: DC

## 2022-07-13 NOTE — Patient Instructions (Addendum)
Flu shot today  Wound culture sent Wound cleaned and dressed.  Continue dressing changes daily with antibiotic ointment.  Start keflex antibiotic 4 times a day.  We will refer you to wound clinic

## 2022-07-13 NOTE — Progress Notes (Signed)
Patient ID: [name], female    DOB: 09-17-1946, 76 y.o.   MRN: [id number]  This visit was conducted in person.  BP 132/70   Pulse 83   Temp (!) 97.5 F (36.4 C) (Temporal)   Ht _0  (1.6 m)   Wt 196 lb 4 oz (89 kg)   SpO2 97%   BMI 34.76 kg/m    CC: check leg Subjective:   HPI: [name] is a 76 y.o. female presenting on 07/13/2022 for Wound Check (C/o L leg pain burning. Cannot see wound clinic 08/29/22.)   Worsening burning pain to LLE sore. Has been present for 2 weeks.  Has been using neosporin antibiotic ointment and bandage change every day. Notes worsening drainage, enlarging wound and burning. No fevers, nausea, streaking redness. She wore knee high support stockings for 2 days but that worsened pain.   Known chronic venous insufficiency.  Difficult situation at home - due to hygiene from multiple cats, continues sleeping at sister's neighboring house.   H/o doxy and sulfa allergy.   Scheduled wound clinic appt 08/29/2022 (earliest she could be seen).   Ongoing diarrhea - she has submitted GI pathogen panel and C diff stool test today.      Relevant past medical, surgical, family and social history reviewed and updated as indicated. Interim medical history since our last visit reviewed. Allergies and medications reviewed and updated. Outpatient Medications Prior to Visit  Medication Sig Dispense Refill   acetaminophen (TYLENOL) 650 MG CR tablet Take 2 tablets (1,300 mg total) by mouth 2 (two) times daily as needed for pain.     albuterol (VENTOLIN HFA) 108 (90 Base) MCG/ACT inhaler INHALE 2 PUFFS BY MOUTH EVERY 6 HOURS AS NEEDED FOR WHEEZING AND SHORTNESS OF BREATH 8.5 each 0   aspirin EC 81 MG tablet Take 81 mg by mouth at bedtime. Melanoma prevention     furosemide (LASIX) 40 MG tablet TAKE 1 TABLET (40 MG TOTAL) BY MOUTH DAILY. TAKE SECOND DOSE IF NEEDED FOR LEG SWELLING/WEIGHT GAIN 60 tablet 2   KLOR-CON M20 20 MEQ tablet TAKE 1 TABLET BY MOUTH TWICE  A DAY 60 tablet 4   loratadine (CLARITIN) 10 MG tablet Take 10 mg by mouth daily.     losartan (COZAAR) 50 MG tablet TAKE 1 TABLET BY MOUTH EVERY DAY 90 tablet 1   montelukast (SINGULAIR) 10 MG tablet TAKE 1 TABLET BY MOUTH EVERY DAY 90 tablet 1   Multiple Vitamin (MULTIVITAMIN WITH MINERALS) TABS tablet Take 1 tablet by mouth daily.     Polyvinyl Alcohol-Povidone (TEARS PLUS OP) Place 1 drop into both eyes daily as needed (dry eyes/ redness/ burning).      PRESCRIPTION MEDICATION CPAP     Probiotic Product (PROBIOTIC DAILY PO) Take 1 tablet by mouth daily.      triamcinolone (NASACORT) 55 MCG/ACT AERO nasal inhaler Place 2 sprays into the nose daily. In each nostril     Vitamin D, Ergocalciferol, (DRISDOL) 1.25 MG (50000 UNIT) CAPS capsule TAKE 1 CAPSULE BY MOUTH ONCE A WEEK 12 capsule 1   No facility-administered medications prior to visit.     Per HPI unless specifically indicated in ROS section below Review of Systems  Objective:  BP 132/70   Pulse 83   Temp (!) 97.5 F (36.4 C) (Temporal)   Ht _1  (1.6 m)   Wt 196 lb 4 oz (89 kg)   SpO2 97%   BMI 34.76 kg/m   Wt  Readings from Last 3 Encounters:  07/13/22 196 lb 4 oz (89 kg)  07/06/22 195 lb 2 oz (88.5 kg)  05/17/22 192 lb 6.4 oz (87.3 kg)      Physical Exam Vitals and nursing note reviewed.  Constitutional:      Appearance: Normal appearance. She is obese.  Musculoskeletal:     Right lower leg: Edema (chronic nonpitting) present.     Left lower leg: Edema (chronic nonpitting) present.  Skin:    General: Skin is warm and dry.     Findings: Lesion and wound present.          Comments: L medial lower leg 2x2.5 cm wound without surrounding erythema or significant drainage   Neurological:     Mental Status: She is alert.  Psychiatric:        Mood and Affect: Mood normal.        Behavior: Behavior normal.      Wound care: Wound cleaned with normal saline and sterile gauze, dressed with triple abx ointment and  nonstick gauze, kerlex, and ace bandage.     Results for orders placed or performed in visit on 04/20/22  CBC  Result Value Ref Range   WBC 5.1 3.8 - 10.8 Thousand/uL   RBC 4.33 3.80 - 5.10 Million/uL   Hemoglobin 12.4 11.7 - 15.5 g/dL   HCT 38.2 35.0 - 45.0 %   MCV 88.2 80.0 - 100.0 fL   MCH 28.6 27.0 - 33.0 pg   MCHC 32.5 32.0 - 36.0 g/dL   RDW 13.6 11.0 - 15.0 %   Platelets 206 140 - 400 Thousand/uL   MPV 9.7 7.5 - 12.5 fL  COMPLETE METABOLIC PANEL WITH GFR  Result Value Ref Range   Glucose, Bld 82 65 - 99 mg/dL   BUN 22 7 - 25 mg/dL   Creat 0.79 0.60 - 1.00 mg/dL   eGFR 78 > OR = 60 mL/min/1.31m   BUN/Creatinine Ratio NOT APPLICABLE 6 - 22 (calc)   Sodium 141 135 - 146 mmol/L   Potassium 4.3 3.5 - 5.3 mmol/L   Chloride 105 98 - 110 mmol/L   CO2 24 20 - 32 mmol/L   Calcium 9.6 8.6 - 10.4 mg/dL   Total Protein 6.5 6.1 - 8.1 g/dL   Albumin 3.7 3.6 - 5.1 g/dL   Globulin 2.8 1.9 - 3.7 g/dL (calc)   AG Ratio 1.3 1.0 - 2.5 (calc)   Total Bilirubin 0.4 0.2 - 1.2 mg/dL   Alkaline phosphatase (APISO) 53 37 - 153 U/L   AST 11 10 - 35 U/L   ALT 18 6 - 29 U/L   *Note: Due to a large number of results and/or encounters for the requested time period, some results have not been displayed. A complete set of results can be found in Results Review.    Assessment & Plan:   Problem List Items Addressed This Visit     Venous stasis ulcer of left lower leg with edema of left lower leg ([facility]    Deteriorated wound to left lower extremity, foul odor, without significant drainage.  Wound culture sent.  Wound dressed, home care instructions provided.  Doxy and sulfa allergies - start keflex 5027mQID x7 days.  She will return in 5 days for wound check.  Will also refer to wound clinic in h/o recurrent venous ulcers of lower extremities.      Relevant Orders   WOUND CULTURE   Ambulatory referral to Wound Clinic  Diarrhea of presumed infectious origin    Ongoing diarrhea in  setting of recent antibiotics (after hospitalization for prior LLE cellulitis 01/2022). She submitted stool samples today for GI pathogen panel and C diff testing.       Relevant Orders   Gastrointestinal Pathogen Pnl RT, PCR   Other Visit Diagnoses     Need for influenza vaccination       Relevant Orders   Flu Vaccine QUAD High Dose(Fluad)        Meds ordered this encounter  Medications   cephALEXin (KEFLEX) 500 MG capsule    Sig: Take 1 capsule (500 mg total) by mouth 4 (four) times daily.    Dispense:  28 capsule    Refill:  0   Orders Placed This Encounter  Procedures   WOUND CULTURE    Order Specific Question:   Source    Answer:   Left medial leg   Flu Vaccine QUAD High Dose(Fluad)   Gastrointestinal Pathogen Pnl RT, PCR   Ambulatory referral to Wound Clinic    Referral Priority:   Routine    Referral Type:   Consultation    Referral Reason:   Specialty Services Required    Requested Specialty:   Wound Care    Number of Visits Requested:   1     Patient Instructions  Flu shot today  Wound culture sent Wound cleaned and dressed.  Continue dressing changes daily with antibiotic ointment.  Start keflex antibiotic 4 times a day.  We will refer you to wound clinic  Follow up plan: No follow-ups on file.  [name], MD

## 2022-07-13 NOTE — Assessment & Plan Note (Signed)
Ongoing diarrhea in setting of recent antibiotics (after hospitalization for prior LLE cellulitis 01/2022). She submitted stool samples today for GI pathogen panel and C diff testing.

## 2022-07-13 NOTE — Assessment & Plan Note (Addendum)
Deteriorated wound to left lower extremity, foul odor, without significant drainage.  Wound culture sent.  Wound dressed, home care instructions provided.  Doxy and sulfa allergies - start keflex '500mg'$  QID x7 days.  She will return in 5 days for wound check.  Will also refer to wound clinic in h/o recurrent venous ulcers of lower extremities.

## 2022-07-14 LAB — GASTROINTESTINAL PATHOGEN PNL
CampyloBacter Group: NOT DETECTED
Norovirus GI/GII: NOT DETECTED
Rotavirus A: NOT DETECTED
Salmonella species: NOT DETECTED
Shiga Toxin 1: NOT DETECTED
Shiga Toxin 2: NOT DETECTED
Shigella Species: NOT DETECTED
Vibrio Group: NOT DETECTED
Yersinia enterocolitica: NOT DETECTED

## 2022-07-16 ENCOUNTER — Ambulatory Visit (INDEPENDENT_AMBULATORY_CARE_PROVIDER_SITE_OTHER): Payer: Medicare Other | Admitting: Psychiatry

## 2022-07-16 ENCOUNTER — Encounter: Payer: Self-pay | Admitting: Psychiatry

## 2022-07-16 ENCOUNTER — Other Ambulatory Visit: Payer: Self-pay | Admitting: Family Medicine

## 2022-07-16 VITALS — BP 134/77 | HR 78 | Ht 63.0 in | Wt 200.6 lb

## 2022-07-16 DIAGNOSIS — R413 Other amnesia: Secondary | ICD-10-CM | POA: Diagnosis not present

## 2022-07-16 LAB — C. DIFFICILE GDH AND TOXIN A/B
GDH ANTIGEN: NOT DETECTED
MICRO NUMBER:: 13891752
SPECIMEN QUALITY:: ADEQUATE
TOXIN A AND B: NOT DETECTED

## 2022-07-16 LAB — WOUND CULTURE
MICRO NUMBER:: 13891731
SPECIMEN QUALITY:: ADEQUATE

## 2022-07-16 MED ORDER — CIPROFLOXACIN HCL 500 MG PO TABS: 500.0000 mg | ORAL_TABLET | Freq: Two times a day (BID) | ORAL | 0 refills | Status: AC

## 2022-07-16 NOTE — Progress Notes (Signed)
CC:  memory loss  Follow-up Visit  Last visit: 01/04/22  Brief HPI: 76 year old left-handed female who follows in clinic for memory loss since December 2022. Around that time she was taking steroids and opiates for back pain. Memory did improve somewhat when she stopped these medications, however she continued to have increased forgetfulness. MRI brain with mild chronic microvascular ischemic changes, otherwise unremarkable.  At her last visit she was referred for neuropsychological testing.  Interval History: Neuropsychological testing was suggestive of mild cognitive impairment with possible frontal lobe dysfunction. There was some concern for early Lewy Body disease due to episodes of visual distortion.  She was hospitalized at the end of March for cellulitis and was found to have very low potassium at the time. Notes she was not consistently taking her potassium supplements. Memory improved with potassium supplementation, and she has been more consistent with her potassium intake. Since adjusting her potassium she has not noticed any further issues with her memory. She feels like she is back to her baseline.  She has had no more episodes of getting lost while driving. Drove in the rain last week without issues.   She was referred to ophthalmology for visual disturbances. This appointment is scheduled for October. She denies visual hallucinations.   She reports baseline gait issues from cervical and lumbar spondylosis. Denies any falls, tremors, or worsening balance.   Physical Exam:   Vital Signs: BP 134/77   Pulse 78   Ht '5\' 3"'$  (1.6 m)   Wt 200 lb 9.6 oz (91 kg)   BMI 35.53 kg/m  GENERAL:  well appearing, in no acute distress, alert  SKIN:  Color, texture, turgor normal. No rashes or lesions HEAD:  Normocephalic/atraumatic. RESP: normal respiratory effort MSK:  No gross joint deformities.   NEUROLOGICAL: Mental Status:     07/16/2022    1:12 PM 01/04/2022   12:51 PM   Montreal Cognitive Assessment   Visuospatial/ Executive (0/5) 5 3  Naming (0/3) 3 3  Attention: Read list of digits (0/2) 2 2  Attention: Read list of letters (0/1) 1 1  Attention: Serial 7 subtraction starting at 100 (0/3) 3 0  Language: Repeat phrase (0/2) 2 2  Language : Fluency (0/1) 1 1  Abstraction (0/2) 2 2  Delayed Recall (0/5) 4 1  Orientation (0/6) 6 6  Total 29 21    Cranial Nerves: PERRL, face symmetric, no dysarthria, hearing grossly intact Motor: moves all extremities equally. No tremor. Fine motor movements normal. Gait: Stooped posture, mildly decreased stride length  IMPRESSION: 76 year old female with a history of cervical and lumbar spondylosis, OSA on CPAP, melanoma (2010), asthma, and peripheral neuropathy who presents for follow up of memory loss and confusion. Shortly after our last visit she was found to have low potassium. Feels her memory is back to baseline since she started taking her potassium supplements regularly. MOCA score today is significantly improved at 29/30. Suspect polypharmacy and hypokalemia were the primary causes of her memory loss. Other than baseline stooped posture, she does not have evidence of parkonsonism on exam. She denies recent visual distortions, but does have an ophthalmology appointment scheduled for next month to assess for ocular pathology. Would avoid memory medication as she is back to her baseline functioning and MOCA score is normal. Advised her to return to clinic if memory issues or visual disturbances reoccur, or if she develops worsening gait/balance issues.  PLAN: -Will defer memory medication at this time given normal  MOCA score -Counseled patient on taking medications and supplements consistently as prescribed -Follow up as needed if memory issues or visual disturbance reoccurs, or if new gait/balance issues develop  Follow-up: PRN  I spent a total of 40 minutes on the date of the service. Discussed medication side  effects, adverse reactions and drug interactions. Written educational materials and patient instructions outlining all of the above were given.  Genia Harold, MD 07/16/22 1:55 PM

## 2022-07-17 ENCOUNTER — Telehealth: Payer: Self-pay

## 2022-07-17 ENCOUNTER — Ambulatory Visit: Payer: Medicare Other | Admitting: Obstetrics & Gynecology

## 2022-07-17 NOTE — Telephone Encounter (Signed)
Lvm asking pt to relay results, Dr. Synthia Innocent message and get answers to his questions.  Labs/Dr. Synthia Innocent msg/questions: How is wound doing?  Your stool tests returned negative for infection.  Wound culture returned positive for pseudomonas which is a bacteria that is more resistant to certain antibiotics. Recommend you stop Keflex and start ciprofloxacin antibiotic '500mg'$  twice daily for 7 days. Update [place] with treatment effect.

## 2022-07-17 NOTE — Telephone Encounter (Signed)
Patient called back in and stated that there is no change in the wound. She stated she picked up the antibiotic and will start taking it today. She stated if she doesn't talk to you today, she will see you tomorrow. Thank you!

## 2022-07-17 NOTE — Telephone Encounter (Signed)
Patient returning call re: message below  Informed patient that [facility] would follow-up later, she was currently with a patient

## 2022-07-18 ENCOUNTER — Ambulatory Visit (INDEPENDENT_AMBULATORY_CARE_PROVIDER_SITE_OTHER): Payer: Medicare Other | Admitting: Family Medicine

## 2022-07-18 ENCOUNTER — Encounter: Payer: Self-pay | Admitting: Family Medicine

## 2022-07-18 DIAGNOSIS — I83029 Varicose veins of left lower extremity with ulcer of unspecified site: Secondary | ICD-10-CM | POA: Diagnosis not present

## 2022-07-18 DIAGNOSIS — R600 Localized edema: Secondary | ICD-10-CM

## 2022-07-18 DIAGNOSIS — I83892 Varicose veins of left lower extremities with other complications: Secondary | ICD-10-CM

## 2022-07-18 DIAGNOSIS — L97929 Non-pressure chronic ulcer of unspecified part of left lower leg with unspecified severity: Secondary | ICD-10-CM

## 2022-07-18 NOTE — Patient Instructions (Addendum)
Schedule wound check on Tuesday 9/19 Dressing changes daily, wash with sterile water or normal saline Finish cipro antibiotic. Stop and let me know if any trouble tolerating it.

## 2022-07-18 NOTE — Assessment & Plan Note (Signed)
Had some throat tightness for a few minutes after cipro tablet this morning (3rd dose), without hives, dyspnea, chest pain, dizziness or tongue/lip swelling. I don't think this was allergic reaction - advised try 1 more dose and if any symptoms develop then stop and let us know.  Wound dressed today.  Recheck 1 wk.  She has wound clinic f/u scheduled for end of next week.

## 2022-07-18 NOTE — Progress Notes (Signed)
Patient ID: [name], female    DOB: [birth year], 76 y.o.   MRN: [id number]  This visit was conducted in person.  BP 138/74   Pulse 79   Temp 97.9 F (36.6 C) (Temporal)   Ht '5\' 3"'$  (1.6 m)   Wt 202 lb 8 oz (91.9 kg)   SpO2 97%   BMI 35.87 kg/m    CC: wound check Subjective:   HPI: [name] is a 76 y.o. female presenting on 07/18/2022 for Wound Check (Here for 5 day chk. Wants to discuss cipro. )   Wound check to LLE venous ulcer, initially treated with keflex but abx changed to cipro '500mg'$  BID x1wk after wound culture grew Pseudomonas.  No fevers/chill, nausea.  Pending wound clinic referral.  H/o doxy and sulfa allergy.  Cipro started yesterday.   Diarrhea - GI pathogen panel returned normal, C diff stool test returned negative.   Saw neurology Dr Billey Gosling on Monday, released given normalized MOCA score - thought polypharmacy related.      Relevant past medical, surgical, family and social history reviewed and updated as indicated. Interim medical history since our last visit reviewed. Allergies and medications reviewed and updated. Outpatient Medications Prior to Visit  Medication Sig Dispense Refill   acetaminophen (TYLENOL) 650 MG CR tablet Take 2 tablets (1,300 mg total) by mouth 2 (two) times daily as needed for pain.     albuterol (VENTOLIN HFA) 108 (90 Base) MCG/ACT inhaler INHALE 2 PUFFS BY MOUTH EVERY 6 HOURS AS NEEDED FOR WHEEZING AND SHORTNESS OF BREATH 8.5 each 0   aspirin EC 81 MG tablet Take 81 mg by mouth at bedtime. Melanoma prevention     ciprofloxacin (CIPRO) 500 MG tablet Take 1 tablet (500 mg total) by mouth 2 (two) times daily for 7 days. 14 tablet 0   furosemide (LASIX) 40 MG tablet TAKE 1 TABLET (40 MG TOTAL) BY MOUTH DAILY. TAKE SECOND DOSE IF NEEDED FOR LEG SWELLING/WEIGHT GAIN 60 tablet 2   KLOR-CON M20 20 MEQ tablet TAKE 1 TABLET BY MOUTH TWICE A DAY 60 tablet 4   loratadine (CLARITIN) 10 MG tablet Take 10 mg by mouth daily.     losartan  (COZAAR) 50 MG tablet TAKE 1 TABLET BY MOUTH EVERY DAY 90 tablet 1   montelukast (SINGULAIR) 10 MG tablet TAKE 1 TABLET BY MOUTH EVERY DAY 90 tablet 1   Multiple Vitamin (MULTIVITAMIN WITH MINERALS) TABS tablet Take 1 tablet by mouth daily.     Polyvinyl Alcohol-Povidone (TEARS PLUS OP) Place 1 drop into both eyes daily as needed (dry eyes/ redness/ burning).      PRESCRIPTION MEDICATION CPAP     Probiotic Product (PROBIOTIC DAILY PO) Take 1 tablet by mouth daily.      triamcinolone (NASACORT) 55 MCG/ACT AERO nasal inhaler Place 2 sprays into the nose daily. In each nostril     Vitamin D, Ergocalciferol, (DRISDOL) 1.25 MG (50000 UNIT) CAPS capsule TAKE 1 CAPSULE BY MOUTH ONCE A WEEK 12 capsule 1   No facility-administered medications prior to visit.     Per HPI unless specifically indicated in ROS section below Review of Systems  Objective:  BP 138/74   Pulse 79   Temp 97.9 F (36.6 C) (Temporal)   Ht '5\' 3"'$  (1.6 m)   Wt 202 lb 8 oz (91.9 kg)   SpO2 97%   BMI 35.87 kg/m   Wt Readings from Last 3 Encounters:  07/18/22 202 lb 8 oz (91.9  kg)  07/16/22 200 lb 9.6 oz (91 kg)  07/13/22 196 lb 4 oz (89 kg)      Physical Exam Vitals and nursing note reviewed.  Constitutional:      Appearance: Normal appearance. She is not ill-appearing.  Skin:    General: Skin is warm and dry.     Findings: Wound present.     Comments: 2.5cm diameter wound to left medial lower leg without surrounding erythema or drainage <3m smaller shallow wound anterior left leg  Neurological:     Mental Status: She is alert.  Psychiatric:        Mood and Affect: Mood normal.        Behavior: Behavior normal.    Wound care: Wound cleaned with NS and gauze, dressed with abx ointment, dressed with nonadhesive gauze, kerlix, ace wrap. Pt tolerated well.     Results for orders placed or performed in visit on 07/13/22  WOUND CULTURE   Specimen: Wound  Result Value Ref Range   MICRO NUMBER: [id number]    SPECIMEN QUALITY: Adequate    SOURCE: LEFT MEDIAL LEG    STATUS: FINAL    GRAM STAIN:      Rare White blood cells seen No epithelial cells seen Rare Gram positive cocci in clusters Many Gram negative bacilli   ISOLATE 1: Pseudomonas aeruginosa (A)       Susceptibility   Pseudomonas aeruginosa - AEROBIC CULT, GRAM STAIN NEGATIVE 1    CEFTAZIDIME 2 Sensitive     CEFEPIME <=1 Sensitive     CIPROFLOXACIN <=0.25 Sensitive     LEVOFLOXACIN 0.5 Sensitive     GENTAMICIN <=1 Sensitive     IMIPENEM 2 Sensitive     PIP/TAZO <=4 Sensitive     TOBRAMYCIN* <=1 Sensitive      * Legend: S = Susceptible  I = Intermediate R = Resistant  NS = Not susceptible * = Not tested  NR = Not reported **NN = See antimicrobic comments   C. difficile GDH and Toxin A/B  Result Value Ref Range   MICRO NUMBER: [id number]   SPECIMEN QUALITY: Adequate    Source STOOL    STATUS: FINAL    GDH ANTIGEN Not Detected    TOXIN A AND B Not Detected    COMMENT      No toxigenic C. difficile detected For additional information, please refer to http://education.[email or web address].com/faq/[email or web address] (This link is being provided for informational/educational purposes only.)  Gastrointestinal Pathogen Pnl RT, PCR  Result Value Ref Range   CampyloBacter Group NOT DETECTED NOT DETECTED   Salmonella species NOT DETECTED NOT DETECTED   Shigella Species NOT DETECTED NOT DETECTED   Vibrio Group NOT DETECTED NOT DETECTED   Yersinia enterocolitica NOT DETECTED NOT DETECTED   Shiga Toxin 1 NOT DETECTED NOT DETECTED   Shiga Toxin 2 NOT DETECTED NOT DETECTED   Norovirus GI/GII NOT DETECTED NOT DETECTED   Rotavirus A NOT DETECTED NOT DETECTED   *Note: Due to a large number of results and/or encounters for the requested time period, some results have not been displayed. A complete set of results can be found in Results Review.    Assessment & Plan:   Problem List Items Addressed This Visit     Venous stasis ulcer of left lower leg  with edema of left lower leg ([facility])    Had some throat tightness for a few minutes after cipro tablet this morning (3rd dose), without hives, dyspnea, chest pain, dizziness or tongue/lip swelling.  I don't think this was allergic reaction - advised try 1 more dose and if any symptoms develop then stop and let us know.  Wound dressed today.  Recheck 1 wk.  She has wound clinic f/u scheduled for end of next week.         No orders of the defined types were placed in this encounter.  No orders of the defined types were placed in this encounter.    Patient Instructions  Schedule wound check on Tuesday 9/19 Dressing changes daily, wash with sterile water or normal saline Finish cipro antibiotic. Stop and let me know if any trouble tolerating it.  Follow up plan: Return if symptoms worsen or fail to improve.  [name], MD

## 2022-07-24 ENCOUNTER — Ambulatory Visit (INDEPENDENT_AMBULATORY_CARE_PROVIDER_SITE_OTHER): Payer: Medicare Other | Admitting: Family Medicine

## 2022-07-24 ENCOUNTER — Encounter: Payer: Self-pay | Admitting: Family Medicine

## 2022-07-24 VITALS — BP 134/76 | HR 74 | Temp 97.3°F | Ht 63.0 in | Wt 201.1 lb

## 2022-07-24 DIAGNOSIS — R600 Localized edema: Secondary | ICD-10-CM | POA: Diagnosis not present

## 2022-07-24 DIAGNOSIS — I83892 Varicose veins of left lower extremities with other complications: Secondary | ICD-10-CM | POA: Diagnosis not present

## 2022-07-24 DIAGNOSIS — I83029 Varicose veins of left lower extremity with ulcer of unspecified site: Secondary | ICD-10-CM | POA: Diagnosis not present

## 2022-07-24 DIAGNOSIS — L97929 Non-pressure chronic ulcer of unspecified part of left lower leg with unspecified severity: Secondary | ICD-10-CM

## 2022-07-24 DIAGNOSIS — R197 Diarrhea, unspecified: Secondary | ICD-10-CM

## 2022-07-24 NOTE — Patient Instructions (Signed)
Finish cipro course.  Keep wound clinic appointment.  Continue Phillips probiotic.  If diarrhea not better, let me know for referral to GI.

## 2022-07-24 NOTE — Progress Notes (Signed)
Patient ID: [name], female    DOB: [birth year], 76 y.o.   MRN: [id number]  This visit was conducted in person.  BP 134/76   Pulse 74   Temp (!) 97.3 F (36.3 C) (Temporal)   Ht '5\' 3"'$  (1.6 m)   Wt 201 lb 2 oz (91.2 kg)   SpO2 95%   BMI 35.63 kg/m    CC: wound check Subjective:   HPI: [name] is a 76 y.o. female presenting on 07/24/2022 for Wound Check   See prior note for details.  Treating LLE venous ulcer with cipro '500mg'$  BID x 1 wk - started 07/17/2022. Wound culture returned pseudomonas.   She feels it's looking about the same   Upcoming appt with wound clinic later this week.      Relevant past medical, surgical, family and social history reviewed and updated as indicated. Interim medical history since our last visit reviewed. Allergies and medications reviewed and updated. Outpatient Medications Prior to Visit  Medication Sig Dispense Refill   acetaminophen (TYLENOL) 650 MG CR tablet Take 2 tablets (1,300 mg total) by mouth 2 (two) times daily as needed for pain.     albuterol (VENTOLIN HFA) 108 (90 Base) MCG/ACT inhaler INHALE 2 PUFFS BY MOUTH EVERY 6 HOURS AS NEEDED FOR WHEEZING AND SHORTNESS OF BREATH 8.5 each 0   aspirin EC 81 MG tablet Take 81 mg by mouth at bedtime. Melanoma prevention     furosemide (LASIX) 40 MG tablet TAKE 1 TABLET (40 MG TOTAL) BY MOUTH DAILY. TAKE SECOND DOSE IF NEEDED FOR LEG SWELLING/WEIGHT GAIN 60 tablet 2   KLOR-CON M20 20 MEQ tablet TAKE 1 TABLET BY MOUTH TWICE A DAY 60 tablet 4   loratadine (CLARITIN) 10 MG tablet Take 10 mg by mouth daily.     losartan (COZAAR) 50 MG tablet TAKE 1 TABLET BY MOUTH EVERY DAY 90 tablet 1   montelukast (SINGULAIR) 10 MG tablet TAKE 1 TABLET BY MOUTH EVERY DAY 90 tablet 1   Multiple Vitamin (MULTIVITAMIN WITH MINERALS) TABS tablet Take 1 tablet by mouth daily.     Polyvinyl Alcohol-Povidone (TEARS PLUS OP) Place 1 drop into both eyes daily as needed (dry eyes/ redness/ burning).       PRESCRIPTION MEDICATION CPAP     Probiotic Product (PROBIOTIC DAILY PO) Take 1 tablet by mouth daily.      triamcinolone (NASACORT) 55 MCG/ACT AERO nasal inhaler Place 2 sprays into the nose daily. In each nostril     Vitamin D, Ergocalciferol, (DRISDOL) 1.25 MG (50000 UNIT) CAPS capsule TAKE 1 CAPSULE BY MOUTH ONCE A WEEK 12 capsule 1   No facility-administered medications prior to visit.     Per HPI unless specifically indicated in ROS section below Review of Systems  Objective:  BP 134/76   Pulse 74   Temp (!) 97.3 F (36.3 C) (Temporal)   Ht '5\' 3"'$  (1.6 m)   Wt 201 lb 2 oz (91.2 kg)   SpO2 95%   BMI 35.63 kg/m   Wt Readings from Last 3 Encounters:  07/24/22 201 lb 2 oz (91.2 kg)  07/18/22 202 lb 8 oz (91.9 kg)  07/16/22 200 lb 9.6 oz (91 kg)      Physical Exam Vitals and nursing note reviewed.  Constitutional:      Appearance: Normal appearance. She is obese.  Skin:         Comments: Left medial leg with 3x3cm shallow ulcer without surrounding erythema  Neurological:     Mental Status: She is alert.    Initial wound picture 07/13/2022:   Today's wound picture 07/24/2022:      Results for orders placed or performed in visit on 07/13/22  WOUND CULTURE   Specimen: Wound  Result Value Ref Range   MICRO NUMBER: [id number]    SPECIMEN QUALITY: Adequate    SOURCE: LEFT MEDIAL LEG    STATUS: FINAL    GRAM STAIN:      Rare White blood cells seen No epithelial cells seen Rare Gram positive cocci in clusters Many Gram negative bacilli   ISOLATE 1: Pseudomonas aeruginosa (A)       Susceptibility   Pseudomonas aeruginosa - AEROBIC CULT, GRAM STAIN NEGATIVE 1    CEFTAZIDIME 2 Sensitive     CEFEPIME <=1 Sensitive     CIPROFLOXACIN <=0.25 Sensitive     LEVOFLOXACIN 0.5 Sensitive     GENTAMICIN <=1 Sensitive     IMIPENEM 2 Sensitive     PIP/TAZO <=4 Sensitive     TOBRAMYCIN* <=1 Sensitive      * Legend: S = Susceptible  I = Intermediate R = Resistant  NS = Not  susceptible * = Not tested  NR = Not reported **NN = See antimicrobic comments   C. difficile GDH and Toxin A/B  Result Value Ref Range   MICRO NUMBER: [id number]    SPECIMEN QUALITY: Adequate    Source STOOL    STATUS: FINAL    GDH ANTIGEN Not Detected    TOXIN A AND B Not Detected    COMMENT      No toxigenic C. difficile detected For additional information, please refer to http://education.[facility].com/faq/FAQ136 (This link is being provided for informational/educational purposes only.)  Gastrointestinal Pathogen Pnl RT, PCR  Result Value Ref Range   CampyloBacter Group NOT DETECTED NOT DETECTED   Salmonella species NOT DETECTED NOT DETECTED   Shigella Species NOT DETECTED NOT DETECTED   Vibrio Group NOT DETECTED NOT DETECTED   Yersinia enterocolitica NOT DETECTED NOT DETECTED   Shiga Toxin 1 NOT DETECTED NOT DETECTED   Shiga Toxin 2 NOT DETECTED NOT DETECTED   Norovirus GI/GII NOT DETECTED NOT DETECTED   Rotavirus A NOT DETECTED NOT DETECTED   *Note: Due to a large number of results and/or encounters for the requested time period, some results have not been displayed. A complete set of results can be found in Results Review.    Assessment & Plan:   Problem List Items Addressed This Visit     Venous stasis ulcer of left lower leg with edema of left lower leg ([place]) - Primary    She has tolerated cipro well for wound culture growing pseudomonas - has a few more doses left.  Wound looks healthier however diameter largely unchanged to slightly larger.  Continue home dressing changes as up to now.  Keep wound clinic appt on Friday.  Pt agrees with plan.       Diarrhea    Ongoing diarrhea for several months despite regular probiotic use. GI pathogen panel negative last visit, as was C diff testing.  She wonders if this could be related to IBS (stressed with still having to stay with her sister due to home hygiene/cat issues).  Discussed if ongoing to let me know for GI  evaluation.         No orders of the defined types were placed in this encounter.  No orders of the defined types were placed in this  encounter.    Patient Instructions  Finish cipro course.  Keep wound clinic appointment.  Continue Phillips probiotic.  If diarrhea not better, let me know for referral to GI.   Follow up plan: No follow-ups on file.  [name], MD

## 2022-07-24 NOTE — Assessment & Plan Note (Signed)
Ongoing diarrhea for several months despite regular probiotic use. GI pathogen panel negative last visit, as was C diff testing.  She wonders if this could be related to IBS (stressed with still having to stay with her sister due to home hygiene/cat issues).  Discussed if ongoing to let me know for GI evaluation.

## 2022-07-24 NOTE — Assessment & Plan Note (Signed)
She has tolerated cipro well for wound culture growing pseudomonas - has a few more doses left.  Wound looks healthier however diameter largely unchanged to slightly larger.  Continue home dressing changes as up to now.  Keep wound clinic appt on Friday.  Pt agrees with plan.

## 2022-07-27 ENCOUNTER — Encounter (HOSPITAL_BASED_OUTPATIENT_CLINIC_OR_DEPARTMENT_OTHER): Payer: Medicare Other | Attending: General Surgery | Admitting: General Surgery

## 2022-07-27 DIAGNOSIS — Z9221 Personal history of antineoplastic chemotherapy: Secondary | ICD-10-CM | POA: Diagnosis not present

## 2022-07-27 DIAGNOSIS — Z923 Personal history of irradiation: Secondary | ICD-10-CM | POA: Insufficient documentation

## 2022-07-27 DIAGNOSIS — I83893 Varicose veins of bilateral lower extremities with other complications: Secondary | ICD-10-CM | POA: Insufficient documentation

## 2022-07-27 DIAGNOSIS — L97222 Non-pressure chronic ulcer of left calf with fat layer exposed: Secondary | ICD-10-CM | POA: Diagnosis not present

## 2022-07-27 DIAGNOSIS — I872 Venous insufficiency (chronic) (peripheral): Secondary | ICD-10-CM | POA: Diagnosis not present

## 2022-07-27 DIAGNOSIS — G4733 Obstructive sleep apnea (adult) (pediatric): Secondary | ICD-10-CM | POA: Insufficient documentation

## 2022-07-27 DIAGNOSIS — R600 Localized edema: Secondary | ICD-10-CM | POA: Insufficient documentation

## 2022-07-27 DIAGNOSIS — Q969 Turner's syndrome, unspecified: Secondary | ICD-10-CM | POA: Diagnosis not present

## 2022-07-27 DIAGNOSIS — G629 Polyneuropathy, unspecified: Secondary | ICD-10-CM | POA: Diagnosis not present

## 2022-07-27 DIAGNOSIS — L97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d: Secondary | ICD-10-CM | POA: Insufficient documentation

## 2022-07-27 DIAGNOSIS — Z87891 Personal history of nicotine dependence: Secondary | ICD-10-CM | POA: Insufficient documentation

## 2022-07-27 DIAGNOSIS — J45909 Unspecified asthma, uncomplicated: Secondary | ICD-10-CM | POA: Diagnosis not present

## 2022-07-27 DIAGNOSIS — I10 Essential (primary) hypertension: Secondary | ICD-10-CM | POA: Insufficient documentation

## 2022-07-27 NOTE — Progress Notes (Signed)
MARIADELCARMEN, CORELLA ([id number]) Visit Report for 07/27/2022 Allergy List Details Patient Name: Date of Service: DAILYNN, NANCARROW 07/27/2022 10:30 A M Medical Record Number: [id number] Patient Account Number: [id number] Date of Birth/Sex: Treating RN: [birth year] (76 y.o. [name] Primary Care Marissa Weaver: [name] Other Clinician: Referring Kamela Blansett: Treating Makenzie Vittorio/Extender: Pixie Casino Weeks in Treatment: 0 Allergies Active Allergies sulfa antibiotics Reaction: swelling, itching, SOB Severity: Severe Type: Medication Voltaren Reaction: SOB Severity: Severe latex adhesive Severity: Mild doxycycline Severity: Moderate Neoprene Severity: Moderate Allergy Notes Neprene (synthetic rubber) Electronic Signature(s) Signed: 07/27/2022 4:59:13 PM By: Blanche East RN Entered By: Blanche East on 07/27/2022 10:38:49 -------------------------------------------------------------------------------- Arrival Information Details Patient Name: Date of Service: [name] 07/27/2022 10:30 A M Medical Record Number: [id number] Patient Account Number: [id number] Date of Birth/Sex: Treating RN: Mar 19, 1946 (76 y.o. [name] Primary Care Ariyona Eid: [name] Other Clinician: Referring Rjay Revolorio: Treating Aylene Acoff/Extender: [name] Session in Treatment: 0 Visit Information Patient Arrived: Ambulatory Arrival Time: 10:33 Accompanied By: self Transfer Assistance: None Patient Identification Verified: Yes Secondary Verification Process Completed: Yes Patient Requires Transmission-Based Precautions: No Patient Has Alerts: No Electronic Signature(s) Signed: 07/27/2022 4:59:13 PM By: Blanche East RN Entered By: Blanche East on 07/27/2022 10:35:54 -------------------------------------------------------------------------------- Clinic Level of Care Assessment Details Patient Name: Date of Service: LAMYRA, MALCOLM  07/27/2022 10:30 A M Medical Record Number: [id number] Patient Account Number: [id number] Date of Birth/Sex: Treating RN: [birth year] (75 y.o. [name] Primary Care Atlee Kluth: [name] Other Clinician: Referring Shajuan Musso: Treating Latanya Hemmer/Extender: [name] Session in Treatment: 0 Clinic Level of Care Assessment Items TOOL 1 Quantity Score X- 1 0 Use when EandM and Procedure is performed on INITIAL visit ASSESSMENTS - Nursing Assessment / Reassessment X- 1 20 General Physical Exam (combine w/ comprehensive assessment (listed just below) when performed on new pt. evals) X- 1 25 Comprehensive Assessment (HX, ROS, Risk Assessments, Wounds Hx, etc.) ASSESSMENTS - Wound and Skin Assessment / Reassessment X- 1 10 Dermatologic / Skin Assessment (not related to wound area) ASSESSMENTS - Ostomy and/or Continence Assessment and Care '[]'$  - 0 Incontinence Assessment and Management '[]'$  - 0 Ostomy Care Assessment and Management (repouching, etc.) PROCESS - Coordination of Care X - Simple Patient / Family Education for ongoing care 1 15 '[]'$  - 0 Complex (extensive) Patient / Family Education for ongoing care X- 1 10 Staff obtains Programmer, systems, Records, T Results / Process Orders est X- 1 10 Staff telephones HHA, Nursing Homes / Clarify orders / etc '[]'$  - 0 Routine Transfer to another Facility (non-emergent condition) '[]'$  - 0 Routine Hospital Admission (non-emergent condition) '[]'$  - 0 New Admissions / Biomedical engineer / Ordering NPWT Apligraf, etc. , '[]'$  - 0 Emergency Hospital Admission (emergent condition) PROCESS - Special Needs '[]'$  - 0 Pediatric / Minor Patient Management '[]'$  - 0 Isolation Patient Management '[]'$  - 0 Hearing / Language / Visual special needs '[]'$  - 0 Assessment of Community assistance (transportation, D/C planning, etc.) '[]'$  - 0 Additional assistance / Altered mentation '[]'$  - 0 Support Surface(s) Assessment (bed, cushion, seat,  etc.) INTERVENTIONS - Miscellaneous '[]'$  - 0 External ear exam '[]'$  - 0 Patient Transfer (multiple staff / Civil Service fast streamer / Similar devices) '[]'$  - 0 Simple Staple / Suture removal (25 or less) '[]'$  - 0 Complex Staple / Suture removal (26 or more) '[]'$  - 0 Hypo/Hyperglycemic Management (do not check if billed separately) X- 1 15 Ankle / Brachial Index (ABI) - do not check if billed separately  Has the patient been seen at the hospital within the last three years: Yes Total Score: 105 Level Of Care: New/Established - Level 3 Electronic Signature(s) Signed: 07/27/2022 4:59:13 PM By: Blanche East RN Entered By: Blanche East on 07/27/2022 11:17:39 -------------------------------------------------------------------------------- Compression Therapy Details Patient Name: Date of Service: SKYLIN, KENNERSON 07/27/2022 10:30 A M Medical Record Number: [id number] Patient Account Number: [id number] Date of Birth/Sex: Treating RN: 1946-06-06 (76 y.o. [name] Primary Care Zamir Staples: [name] Other Clinician: Referring Adda Stokes: Treating Arvel Oquinn/Extender: Pixie Casino Weeks in Treatment: 0 Compression Therapy Performed for Wound Assessment: Wound #1 Left,Medial Lower Leg Performed By: Clinician Blanche East, RN Compression Type: Three Layer Pre Treatment ABI: 0.9 Post Procedure Diagnosis Same as Pre-procedure Electronic Signature(s) Signed: 07/27/2022 4:59:13 PM By: Blanche East RN Entered By: Blanche East on 07/27/2022 11:45:00 -------------------------------------------------------------------------------- Encounter Discharge Information Details Patient Name: Date of Service: [name]. 07/27/2022 10:30 A M Medical Record Number: [id number] Patient Account Number: [id number] Date of Birth/Sex: Treating RN: 02-Apr-1946 (76 y.o. [name] Primary Care Tanasia Budzinski: [name] Other Clinician: Referring Yanilen Adamik: Treating Benuel Ly/Extender: [name] Session in Treatment: 0 Encounter Discharge Information Items Post Procedure Vitals Discharge Condition: Stable Temperature (F): 97.7 Ambulatory Status: Ambulatory Pulse (bpm): 83 Discharge Destination: Home Respiratory Rate (breaths/min): 18 Transportation: Private Auto Blood Pressure (mmHg): 164/81 Accompanied By: self Schedule Follow-up Appointment: Yes Clinical Summary of Care: Electronic Signature(s) Signed: 07/27/2022 4:59:13 PM By: Blanche East RN Entered By: Blanche East on 07/27/2022 11:22:36 -------------------------------------------------------------------------------- Lower Extremity Assessment Details Patient Name: Date of Service: ANNALEIGHA, WOO 07/27/2022 10:30 A M Medical Record Number: [id number] Patient Account Number: [id number] Date of Birth/Sex: Treating RN: [birth year] (76 y.o. [name] Primary Care Falicity Sheets: [name] Other Clinician: Referring Shlomo Seres: Treating Nyari Olsson/Extender: Pixie Casino Weeks in Treatment: 0 Edema Assessment Assessed: [Left: No] [Right: No] E[Left: dema] [Right: :] Calf Left: Right: Point of Measurement: From Medial Instep 46.5 cm Ankle Left: Right: Point of Measurement: From Medial Instep 23.5 cm Knee To Floor Left: Right: From Medial Instep 46 cm Vascular Assessment Pulses: Dorsalis Pedis Palpable: [Left:Yes] Doppler Audible: [Left:Yes] Blood Pressure: Brachial: [Left:164] Dorsalis Pedis: 150 Ankle: Posterior Tibial: 120 Ankle Brachial Index: [Left:0.91] Electronic Signature(s) Signed: 07/27/2022 4:59:13 PM By: Blanche East RN Entered By: Blanche East on 07/27/2022 11:06:13 -------------------------------------------------------------------------------- Multi-Disciplinary Care Plan Details Patient Name: Date of Service: [name]. 07/27/2022 10:30 A M Medical Record Number: [id number] Patient Account Number: [id number] Date of  Birth/Sex: Treating RN: 09-07-1946 (76 y.o. [name] Primary Care Margaux Engen: [name] Other Clinician: Referring Mattison Stuckey: Treating Erlinda Solinger/Extender: Pixie Casino Weeks in Treatment: 0 Active Inactive Electronic Signature(s) Signed: 07/27/2022 4:59:13 PM By: Blanche East RN Entered By: Blanche East on 07/27/2022 11:08:14 -------------------------------------------------------------------------------- Pain Assessment Details Patient Name: Date of Service: JACOB, CHAMBLEE 07/27/2022 10:30 A M Medical Record Number: [id number] Patient Account Number: [id number] Date of Birth/Sex: Treating RN: [birth year] (76 y.o. [name] Primary Care Lashone Stauber: [name] Other Clinician: Referring Roiza Wiedel: Treating Delilah Mulgrew/Extender: Pixie Casino Weeks in Treatment: 0 Active Problems Location of Pain Severity and Description of Pain Patient Has Paino No Site Locations Pain Management and Medication Current Pain Management: Electronic Signature(s) Signed: 07/27/2022 4:59:13 PM By: Blanche East RN Entered By: Blanche East on 07/27/2022 11:06:31 -------------------------------------------------------------------------------- Patient/Caregiver Education Details Patient Name: Date of Service: [name] 9/22/2023andnbsp10:30 A M Medical Record Number: [id number] Patient Account Number: [id number] Date of Birth/Gender: Treating RN: October 02, 1946 (76 y.o.  [name] Primary Care Physician: [name] Other Clinician: Referring Physician: Treating Physician/Extender: [name] Session in Treatment: 0 Education Assessment Education Provided To: Patient Education Topics Provided Venous: Handouts: Controlling Swelling with Compression Stockings , Controlling Swelling with Multilayered Compression Wraps, Managing Venous Disease and Handouts: Related Ulcers Methods:  Explain/Verbal Responses: Reinforcements needed, State content correctly Concepcion: o Handouts: Welcome T [facility] o Methods: Explain/Verbal Responses: Reinforcements needed, State content correctly Wound Debridement: Handouts: Wound Debridement Methods: Explain/Verbal Responses: Reinforcements needed, State content correctly Electronic Signature(s) Signed: 07/27/2022 4:59:13 PM By: Blanche East RN Entered By: Blanche East on 07/27/2022 11:44:11 -------------------------------------------------------------------------------- Wound Assessment Details Patient Name: Date of Service: IDY, RAWLING 07/27/2022 10:30 A M Medical Record Number: [id number] Patient Account Number: [id number] Date of Birth/Sex: Treating RN: 02/05/1946 (76 y.o. [name], [name] Primary Care Kalob Bergen: [name] Other Clinician: Referring Banesa Tristan: Treating Janit Cutter/Extender: Pixie Casino Weeks in Treatment: 0 Wound Status Wound Number: 1 Primary Etiology: Venous Leg Ulcer Wound Location: Left, Medial Lower Leg Wound Status: Open Wounding Event: Blister Comorbid History: Cataracts, Asthma, Hypertension, Neuropathy Date Acquired: 07/05/2022 Weeks Of Treatment: 0 Clustered Wound: No Photos Wound Measurements Length: (cm) 0.6 Width: (cm) 0.5 Depth: (cm) 0.1 Area: (cm) 0.236 Volume: (cm) 0.024 % Reduction in Area: % Reduction in Volume: Tunneling: No Undermining: No Wound Description Classification: Full Thickness Without Exposed Support Structures Exudate Amount: Medium Exudate Type: Serosanguineous Exudate Color: red, brown Foul Odor After Cleansing: No Slough/Fibrino Yes Wound Bed Granulation Amount: Large (67-100%) Granulation Quality: Pink Necrotic Amount: Small (1-33%) Necrotic Quality: Adherent Slough Treatment Notes Wound #1 (Lower Leg) Wound Laterality: Left, Medial Cleanser Soap and Water Discharge  Instruction: May shower and wash wound with dial antibacterial soap and water prior to dressing change. Peri-Wound Care Sween Lotion (Moisturizing lotion) Discharge Instruction: Apply moisturizing lotion as directed Topical Triamcinolone Discharge Instruction: Apply Triamcinolone as directed Primary Dressing Iodosorb Gel 10 (gm) Tube Discharge Instruction: Apply to wound bed as instructed Secondary Dressing Zetuvit Plus Silicone Border Dressing 4x4 (in/in) Discharge Instruction: Apply silicone border over primary dressing as directed. Secured With [employer] Surgical T 2x10 (in/yd) ape Discharge Instruction: Secure with tape as directed. Compression Wrap ThreePress (3 layer compression wrap) Discharge Instruction: Apply three layer compression as directed. Compression Stockings Add-Ons Electronic Signature(s) Signed: 07/27/2022 4:59:13 PM By: Blanche East RN Entered By: Blanche East on 07/27/2022 10:46:06 -------------------------------------------------------------------------------- Wound Assessment Details Patient Name: Date of Service: MYAH, GUYNES 07/27/2022 10:30 A M Medical Record Number: [id number] Patient Account Number: [id number] Date of Birth/Sex: Treating RN: 06-[name]-1946 (76 y.o. [name], [name] Primary Care Ryle Buscemi: [name] Other Clinician: Referring Liisa Picone: Treating Eva Vallee/Extender: Pixie Casino Weeks in Treatment: 0 Wound Status Wound Number: 2 Primary Etiology: Venous Leg Ulcer Wound Location: Left, Posterior Lower Leg Wound Status: Open Wounding Event: Blister Comorbid History: Cataracts, Asthma, Hypertension, Neuropathy Date Acquired: 07/06/2022 Weeks Of Treatment: 0 Clustered Wound: No Photos Wound Measurements Length: (cm) 3.5 Width: (cm) 2.5 Depth: (cm) 0.1 Area: (cm) 6.872 Volume: (cm) 0.687 % Reduction in Area: % Reduction in Volume: Epithelialization: None Tunneling: No Undermining:  No Wound Description Classification: Full Thickness Without Exposed Support Structu Wound Margin: Distinct, outline attached Exudate Amount: Medium Exudate Type: Serosanguineous Exudate Color: red, brown res Foul Odor After Cleansing: No Slough/Fibrino Yes Wound Bed Granulation Amount: Medium (34-66%) Exposed Structure Granulation Quality: Red Fascia Exposed: No Necrotic Amount: Medium (34-66%) Fat Layer (Subcutaneous Tissue) Exposed: Yes  Necrotic Quality: Adherent Slough Tendon Exposed: No Muscle Exposed: No Joint Exposed: No Bone Exposed: No Treatment Notes Wound #2 (Lower Leg) Wound Laterality: Left, Posterior Cleanser Soap and Water Discharge Instruction: May shower and wash wound with dial antibacterial soap and water prior to dressing change. Peri-Wound Care Sween Lotion (Moisturizing lotion) Discharge Instruction: Apply moisturizing lotion as directed Topical Triamcinolone Discharge Instruction: Apply Triamcinolone as directed Primary Dressing Iodosorb Gel 10 (gm) Tube Discharge Instruction: Apply to wound bed as instructed Secondary Dressing Zetuvit Plus Silicone Border Dressing 4x4 (in/in) Discharge Instruction: Apply silicone border over primary dressing as directed. Secured With [employer] Surgical T 2x10 (in/yd) ape Discharge Instruction: Secure with tape as directed. Compression Wrap ThreePress (3 layer compression wrap) Discharge Instruction: Apply three layer compression as directed. Compression Stockings Add-Ons Electronic Signature(s) Signed: 07/27/2022 4:59:13 PM By: Blanche East RN Signed: 07/27/2022 4:59:13 PM By: Blanche East RN Entered By: Blanche East on 07/27/2022 10:48:06 -------------------------------------------------------------------------------- Vitals Details Patient Name: Date of Service: [name]. 07/27/2022 10:30 A M Medical Record Number: [id number] Patient Account Number: [id number] Date of  Birth/Sex: Treating RN: 1946/09/04 (76 y.o. [name], [name] Primary Care Lundyn Coste: [name] Other Clinician: Referring Harrie Cazarez: Treating Kamaal Cast/Extender: [name] Session in Treatment: 0 Vital Signs Time Taken: 10:35 Temperature (F): 97.7 Height (in): 63 Pulse (bpm): 83 Source: Stated Respiratory Rate (breaths/min): 18 Weight (lbs): 200 Blood Pressure (mmHg): 164/81 Source: Stated Reference Range: 80 - 120 mg / dl Body Mass Index (BMI): 35.4 Electronic Signature(s) Signed: 07/27/2022 4:59:13 PM By: Blanche East RN Entered By: Blanche East on 07/27/2022 10:36:57

## 2022-07-27 NOTE — Progress Notes (Signed)
[name], [name] ([id number]) Visit Report for 07/27/2022 Abuse Risk Screen Details Patient Name: Date of Service: [name], [name] 07/27/2022 10:30 A M Medical Record Number: [id number] Patient Account Number: [id number] Date of Birth/Sex: Treating RN: 09/30/1946 (76 y.o. [name], [name] Primary Care Adama Ferber: [name] Other Clinician: Referring Whitley Patchen: Treating Rosi Secrist/Extender: [name] Session in Treatment: 0 Abuse Risk Screen Items Answer ABUSE RISK SCREEN: Has anyone close to you tried to hurt or harm you recentlyo No Do you feel uncomfortable with anyone in your familyo No Has anyone forced you do things that you didnt want to doo No Electronic Signature(s) Signed: 07/27/2022 4:59:13 PM By: Blanche East RN Entered By: Blanche East on 07/27/2022 10:55:29 -------------------------------------------------------------------------------- Activities of Daily Living Details Patient Name: Date of Service: [name], [name] 07/27/2022 10:30 A M Medical Record Number: [id number] Patient Account Number: [id number] Date of Birth/Sex: Treating RN: 1946-01-06 (76 y.o. [name] Primary Care Vance Hochmuth: [name] Other Clinician: Referring Gautam Langhorst: Treating Kymir Coles/Extender: [name] Session in Treatment: 0 Activities of Daily Living Items Answer Activities of Daily Living (Please select one for each item) Drive Automobile Completely Able T Medications ake Completely Able Use T elephone Completely Able Care for Appearance Completely Able Use T oilet Completely Able Bath / Shower Completely Able Dress Self Completely Able Feed Self Need Assistance Walk Completely Able Get In / Out Bed Completely Able Housework Completely Able Prepare Meals Completely Able Handle Money Completely Able Shop for Self Completely Able Electronic Signature(s) Signed: 07/27/2022 4:59:13 PM By: Blanche East RN Entered By: Blanche East on 07/27/2022 10:56:10 -------------------------------------------------------------------------------- Education Screening Details Patient Name: Date of Service: [name] 07/27/2022 10:30 A M Medical Record Number: [id number] Patient Account Number: [id number] Date of Birth/Sex: Treating RN: 04/06/1946 (76 y.o. [name] Primary Care Kayann Maj: [name] Other Clinician: Referring Ishani Goldwasser: Treating Caeli Linehan/Extender: [name] Session in Treatment: 0 Primary Learner Assessed: Patient Learning Preferences/Education Level/Primary Language Learning Preference: Explanation Highest Education Level: College or Above Preferred Language: English Cognitive Barrier Language Barrier: No Translator Needed: No Memory Deficit: No Emotional Barrier: No Cultural/Religious Beliefs Affecting Medical Care: No Physical Barrier Impaired Vision: No Impaired Hearing: No Decreased Hand dexterity: No Knowledge/Comprehension Knowledge Level: High Comprehension Level: High Ability to understand written instructions: High Ability to understand verbal instructions: High Motivation Anxiety Level: Calm Cooperation: Cooperative Education Importance: Acknowledges Need Interest in Health Problems: Asks Questions Perception: Coherent Willingness to Engage in Self-Management High Activities: Readiness to Engage in Self-Management High Activities: Electronic Signature(s) Signed: 07/27/2022 4:59:13 PM By: Blanche East RN Entered By: Blanche East on 07/27/2022 10:56:58 -------------------------------------------------------------------------------- Fall Risk Assessment Details Patient Name: Date of Service: [name]. 07/27/2022 10:30 A M Medical Record Number: [id number] Patient Account Number: [id number] Date of Birth/Sex: Treating RN: 08-May-1946 (76 y.o. [name], [name] Primary Care Elyn Krogh: [name] Other Clinician: Referring  Adelard Sanon: Treating Brice Potteiger/Extender: [name] Session in Treatment: 0 Fall Risk Assessment Items Have you had 2 or more falls in the last 12 monthso 0 No Have you had any fall that resulted in injury in the last 12 monthso 0 No FALLS RISK SCREEN History of falling - immediate or within 3 months 0 No Secondary diagnosis (Do you have 2 or more medical diagnoseso) 0 No Ambulatory aid None/bed rest/wheelchair/nurse 0 No Crutches/cane/walker 0 No Furniture 0 No Intravenous therapy Access/Saline/Heparin Lock 0 No Gait/Transferring Normal/ bed rest/ wheelchair 0 No Weak (short steps with or without shuffle, stooped but  able to lift head while walking, may seek 0 No support from furniture) Impaired (short steps with shuffle, may have difficulty arising from chair, head down, impaired 0 No balance) Mental Status Oriented to own ability 0 Yes Electronic Signature(s) Signed: 07/27/2022 4:59:13 PM By: Blanche East RN Entered By: Blanche East on 07/27/2022 10:57:16 -------------------------------------------------------------------------------- Foot Assessment Details Patient Name: Date of Service: [name], [name] 07/27/2022 10:30 A M Medical Record Number: [id number] Patient Account Number: [id number] Date of Birth/Sex: Treating RN: [birth year] (76 y.o. [name] Primary Care Durward Matranga: [name] Other Clinician: Referring Mariadel Mruk: Treating Ethanael Veith/Extender: Pixie Casino Weeks in Treatment: 0 Foot Assessment Items Site Locations + = Sensation present, - = Sensation absent, C = Callus, U = Ulcer R = Redness, W = Warmth, M = Maceration, PU = Pre-ulcerative lesion F = Fissure, S = Swelling, D = Dryness Assessment Right: Left: Other Deformity: No No Prior Foot Ulcer: No No Prior Amputation: No No Charcot Joint: No No Ambulatory Status: Ambulatory Without Help Gait: Steady Electronic Signature(s) Signed: 07/27/2022  4:59:13 PM By: Blanche East RN Entered By: Blanche East on 07/27/2022 11:03:09 -------------------------------------------------------------------------------- Nutrition Risk Screening Details Patient Name: Date of Service: [name], [name] 07/27/2022 10:30 A M Medical Record Number: [id number] Patient Account Number: [id number] Date of Birth/Sex: Treating RN: 04-04-1946 (76 y.o. [name], [name] Primary Care Gisselle Galvis: [name] Other Clinician: Referring Tayvin Preslar: Treating Mickel Schreur/Extender: Pixie Casino Weeks in Treatment: 0 Height (in): 63 Weight (lbs): 200 Body Mass Index (BMI): 35.4 Nutrition Risk Screening Items Score Screening NUTRITION RISK SCREEN: I have an illness or condition that made me change the kind and/or amount of food I eat 0 No I eat fewer than two meals per day 0 No I eat few fruits and vegetables, or milk products 0 No I have three or more drinks of beer, liquor or wine almost every day 0 No I have tooth or mouth problems that make it hard for me to eat 0 No I don't always have enough money to buy the food I need 0 No I eat alone most of the time 0 No I take three or more different prescribed or over-the-counter drugs a day 1 Yes Without wanting to, I have lost or gained 10 pounds in the last six months 2 Yes I am not always physically able to shop, cook and/or feed myself 0 No Nutrition Protocols Good Risk Protocol Moderate Risk Protocol 0 Provide education on nutrition High Risk Proctocol Risk Level: Moderate Risk Score: 3 Electronic Signature(s) Signed: 07/27/2022 4:59:13 PM By: Blanche East RN Entered By: Blanche East on 07/27/2022 10:57:39

## 2022-07-27 NOTE — Progress Notes (Signed)
MILANI, LOWENSTEIN ([id number]) Visit Report for 07/27/2022 Chief Complaint Document Details Patient Name: Date of Service: [name], [name] 07/27/2022 10:30 A M Medical Record Number: [id number] Patient Account Number: [id number] Date of Birth/Sex: Treating RN: April 30, 1946 (76 y.o. [name] Primary Care Provider: [name] Other Clinician: Referring Provider: Treating Provider/Extender: [name] Session in Treatment: 0 Information Obtained from: Patient Chief Complaint Patient presents for treatment of an open ulcer due to venous insufficiency Electronic Signature(s) Signed: 07/27/2022 11:06:24 AM By: Fredirick Maudlin MD FACS Entered By: Fredirick Maudlin on 07/27/2022 11:06:24 -------------------------------------------------------------------------------- Debridement Details Patient Name: Date of Service: [name]. 07/27/2022 10:30 A M Medical Record Number: [id number] Patient Account Number: [id number] Date of Birth/Sex: Treating RN: 22-Oct-1946 (76 y.o. [name], [name] Primary Care Provider: [name] Other Clinician: Referring Provider: Treating Provider/Extender: [name] Session in Treatment: 0 Debridement Performed for Assessment: Wound #2 Left,Posterior Lower Leg Performed By: Physician Fredirick Maudlin, MD Debridement Type: Debridement Severity of Tissue Pre Debridement: Fat layer exposed Level of Consciousness (Pre-procedure): Awake and Alert Pre-procedure Verification/Time Out Yes - 11:13 Taken: Start Time: 11:14 Pain Control: Lidocaine 4% T opical Solution T Area Debrided (L x W): otal 3.5 (cm) x 2.5 (cm) = 8.75 (cm) Tissue and other material debrided: Non-Viable, Slough, Slough Level: Non-Viable Tissue Debridement Description: Selective/Open Wound Instrument: Curette Bleeding: Minimum Hemostasis Achieved: Pressure Procedural Pain: 0 Post Procedural Pain: 0 Response to Treatment: Procedure  was tolerated well Level of Consciousness (Post- Awake and Alert procedure): Post Debridement Measurements of Total Wound Length: (cm) 3.5 Width: (cm) 2.5 Depth: (cm) 0.1 Volume: (cm) 0.687 Character of Wound/Ulcer Post Debridement: Requires Further Debridement Severity of Tissue Post Debridement: Fat layer exposed Post Procedure Diagnosis Same as Pre-procedure Notes Scribed for Dr. Celine Ahr by Blanche East, RN Electronic Signature(s) Signed: 07/27/2022 11:54:28 AM By: Fredirick Maudlin MD FACS Signed: 07/27/2022 4:59:13 PM By: Blanche East RN Entered By: Blanche East on 07/27/2022 11:15:51 -------------------------------------------------------------------------------- HPI Details Patient Name: Date of Service: [name]. 07/27/2022 10:30 A M Medical Record Number: [id number] Patient Account Number: [id number] Date of Birth/Sex: Treating RN: 08-01-1946 (76 y.o. [name] Primary Care Provider: [name] Other Clinician: Referring Provider: Treating Provider/Extender: [name] Session in Treatment: 0 History of Present Illness HPI Description: ADMISSION This is a 76 year old woman with a history of chronic venous insufficiency status post saphenous vein ablations in 2010 and 2016. 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 She presents today with an open venous ulcer on her left lower leg. It has been present for a little over 2 weeks. She saw her primary care provider who apparently swabbed the wound and grew out Pseudomonas. She just completed a course of ciprofloxacin for this. ABI was 0.91. She reports that she has had previous issues with ulcers in this same location, stemming back to a punch biopsy taken by a dermatologist many years ago. She has had several skin substitutes applied to the area that have ultimately resulted in healing on prior occasions. She has 2 small ulcers on her left medial lower leg.  There is slough accumulation in both of them. The more anterior of the 2 is quite small and has some soft slough on the surface, underneath which there is good granulation tissue. The more medial wound also has slough accumulation, but the underlying surface is fairly fibrotic and gritty. This is consistent with her provided history of multiple ulcers in the same location. Electronic  Signature(s) Signed: 07/27/2022 11:35:09 AM By: Fredirick Maudlin MD FACS Previous Signature: 07/27/2022 11:07:41 AM Version By: Fredirick Maudlin MD FACS Entered By: Fredirick Maudlin on 07/27/2022 11:35:09 -------------------------------------------------------------------------------- Physical Exam Details Patient Name: Date of Service: [name], [name] 07/27/2022 10:30 A M Medical Record Number: [id number] Patient Account Number: [id number] Date of Birth/Sex: Treating RN: [birth year] (76 y.o. [name] Primary Care Provider: [name] Other Clinician: Referring Provider: Treating Provider/Extender: Pixie Casino Weeks in Treatment: 0 Constitutional Hypertensive, asymptomatic. . . . No acute distress. Respiratory Normal work of breathing on room air.. Cardiovascular Changes consistent with chronic venous stasis. 3+ pitting edema to the left lower extremity.. Notes 07/27/2022: She has 2 small ulcers on her left medial lower leg. There is slough accumulation in both of them. The more anterior of the 2 is quite small and has some soft slough on the surface, underneath which there is good granulation tissue. The more medial wound also has slough accumulation, but the underlying surface is fairly fibrotic and gritty. This is consistent with her provided history of multiple ulcers in the same location. Electronic Signature(s) Signed: 07/27/2022 11:36:07 AM By: Fredirick Maudlin MD FACS Entered By: Fredirick Maudlin on 07/27/2022  11:36:07 -------------------------------------------------------------------------------- Physician Orders Details Patient Name: Date of Service: [name], [name] 07/27/2022 10:30 A M Medical Record Number: [id number] Patient Account Number: [id number] Date of Birth/Sex: Treating RN: 05-Apr-1946 (76 y.o. [name], [name] Primary Care Provider: [name] Other Clinician: Referring Provider: Treating Provider/Extender: [name] Session in Treatment: 0 Verbal / Phone Orders: No Diagnosis Coding ICD-10 Coding Code Description [phone] Non-pressure chronic ulcer of other part of left lower leg with fat layer exposed I83.893 Varicose veins of bilateral lower extremities with other complications O37.8 Venous insufficiency (chronic) (peripheral) G62.9 Polyneuropathy, unspecified R60.0 Localized edema Follow-up Appointments ppointment in 1 week. - Dr. Celine Ahr RM 4 Return A Anesthetic (In clinic) Topical Lidocaine 4% applied to wound bed - in clinic, prior to debridement Bathing/ Shower/ Hygiene May shower with protection but do not get wound dressing(s) wet. Edema Control - Lymphedema / SCD / Other Left Lower Extremity Avoid standing for long periods of time. Patient to wear own compression stockings every day. - Right lower extremity Moisturize legs daily. Wound Treatment Wound #1 - Lower Leg Wound Laterality: Left, Medial Cleanser: Soap and Water Discharge Instructions: May shower and wash wound with dial antibacterial soap and water prior to dressing change. Peri-Wound Care: Sween Lotion (Moisturizing lotion) Discharge Instructions: Apply moisturizing lotion as directed Topical: Triamcinolone Discharge Instructions: Apply Triamcinolone as directed Prim Dressing: Iodosorb Gel 10 (gm) Tube ary Discharge Instructions: Apply to wound bed as instructed Secondary Dressing: Zetuvit Plus Silicone Border Dressing 4x4 (in/in) Discharge Instructions: Apply  silicone border over primary dressing as directed. Secured With: 36M Medipore Public affairs consultant Surgical T 2x10 (in/yd) ape Discharge Instructions: Secure with tape as directed. Compression Wrap: ThreePress (3 layer compression wrap) Discharge Instructions: Apply three layer compression as directed. Wound #2 - Lower Leg Wound Laterality: Left, Posterior Cleanser: Soap and Water Discharge Instructions: May shower and wash wound with dial antibacterial soap and water prior to dressing change. Peri-Wound Care: Sween Lotion (Moisturizing lotion) Discharge Instructions: Apply moisturizing lotion as directed Topical: Triamcinolone Discharge Instructions: Apply Triamcinolone as directed Prim Dressing: Iodosorb Gel 10 (gm) Tube ary Discharge Instructions: Apply to wound bed as instructed Secondary Dressing: Zetuvit Plus Silicone Border Dressing 4x4 (in/in) Discharge Instructions: Apply silicone border over primary dressing as directed. Secured With: Teacher, adult education  Surgical T 2x10 (in/yd) ape Discharge Instructions: Secure with tape as directed. Compression Wrap: ThreePress (3 layer compression wrap) Discharge Instructions: Apply three layer compression as directed. Patient Medications llergies: sulfa antibiotics, Voltaren, doxycycline, Neoprene, adhesive, latex A Notifications Medication Indication Start End prior to debridement 07/27/2022 lidocaine DOSE topical 4 % cream - cream topical Electronic Signature(s) Signed: 07/27/2022 11:54:28 AM By: Fredirick Maudlin MD FACS Entered By: Fredirick Maudlin on 07/27/2022 11:38:43 Prescription 07/27/2022 -------------------------------------------------------------------------------- [name]. Fredirick Maudlin MD Patient Name: Provider: 1946-07-11 [id number] Date of Birth: NPI#: [id number] Sex: DEA #: [phone] Phone #: License #: [place] Patient Address: [address] [place], [place] [address] [place], [place] [address] [phone] Allergies sulfa antibiotics; Voltaren; doxycycline; Neoprene; adhesive; latex Medication Medication: Route: Strength: Form: lidocaine 4 % topical cream topical 4% cream Class: TOPICAL LOCAL ANESTHETICS Dose: Frequency / Time: Indication: cream topical prior to debridement Number of Refills: Number of Units: 0 Generic Substitution: Start Date: End Date: One Time Use: Substitution Permitted 4/49/6759 No Note to Pharmacy: Hand Signature: Date(s): Electronic Signature(s) Signed: 07/27/2022 11:54:28 AM By: Fredirick Maudlin MD FACS Entered By: Fredirick Maudlin on 07/27/2022 11:38:43 -------------------------------------------------------------------------------- Problem List Details Patient Name: Date of Service: [name]. 07/27/2022 10:30 A M Medical Record Number: [id number] Patient Account Number: [id number] Date of Birth/Sex: Treating RN: 10-18-1946 (76 y.o. [name] Primary Care Provider: [name] Other Clinician: Referring Provider: Treating Provider/Extender: Pixie Casino Weeks in Treatment: 0 Active Problems ICD-10 Encounter Code Description Active Date MDM Diagnosis [phone] Non-pressure chronic ulcer of other part of left lower leg with fat layer exposed 07/27/2022 No Yes I83.893 Varicose veins of bilateral lower extremities with other complications 7/79/3903 No Yes I87.2 Venous insufficiency (chronic) (peripheral) 07/27/2022 No Yes G62.9 Polyneuropathy, unspecified 07/27/2022 No Yes R60.0 Localized edema 07/27/2022 No Yes Inactive Problems Resolved Problems Electronic Signature(s) Signed: 07/27/2022 11:54:28 AM By: Fredirick Maudlin MD FACS Signed: 07/27/2022 4:59:13 PM By: Blanche East RN Previous Signature: 07/27/2022 11:05:16 AM Version By: Fredirick Maudlin MD FACS Entered By: Blanche East on 07/27/2022  11:21:57 -------------------------------------------------------------------------------- Progress Note Details Patient Name: Date of Service: [name], [name] 07/27/2022 10:30 A M Medical Record Number: [id number] Patient Account Number: [id number] Date of Birth/Sex: Treating RN: Oct 08, 1946 (76 y.o. [name] Primary Care Provider: [name] Other Clinician: Referring Provider: Treating Provider/Extender: [name] Session in Treatment: 0 Subjective Chief Complaint Information obtained from Patient Patient presents for treatment of an open ulcer due to venous insufficiency History of Present Illness (HPI) ADMISSION This is a 76 year old woman with a history of chronic venous insufficiency status post saphenous vein ablations in 2010 and 2016. 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 She presents today with an open venous ulcer on her left lower leg. It has been present for a little over 2 weeks. She saw her primary care provider who apparently swabbed the wound and grew out Pseudomonas. She just completed a course of ciprofloxacin for this. ABI was 0.91. She reports that she has had previous issues with ulcers in this same location, stemming back to a punch biopsy taken by a dermatologist many years ago. She has had several skin substitutes applied to the area that have ultimately resulted in healing on prior occasions. She has 2 small ulcers on her left medial lower leg. There is slough accumulation in both of them. The more anterior of the 2  is quite small and has some soft slough on the surface, underneath which there is good granulation tissue. The more medial wound also has slough accumulation, but the underlying surface is fairly fibrotic and gritty. This is consistent with her provided history of multiple ulcers in the same location. Patient History Information obtained from Patient. Allergies sulfa antibiotics  (Severity: Severe, Reaction: swelling, itching, SOB), Voltaren (Severity: Severe, Reaction: SOB), latex, adhesive (Severity: Mild), doxycycline (Severity: Moderate), Neoprene (Severity: Moderate) General Notes: Nep[place] Immunologist) Family History Cancer - Mother,Paternal Grandparents, Diabetes - Siblings, Heart Disease - Father, Hypertension - Father. Social History Former smoker - quit in 1967. Medical History Eyes Patient has history of Cataracts - L eye Ear/Nose/Mouth/Throat Denies history of Chronic sinus problems/congestion, Middle ear problems Hematologic/Lymphatic Denies history of Anemia, Human Immunodeficiency Virus, Lymphedema Respiratory Patient has history of Asthma Cardiovascular Patient has history of Hypertension Endocrine Denies history of Type I Diabetes, Type II Diabetes Neurologic Patient has history of Neuropathy - Feet and finger Denies history of Seizure Disorder Oncologic Patient has history of Received Chemotherapy - 2012, Received Radiation - 2012 Hospitalization/Surgery History - Lower extremity venography 2020. - Intravascular ultrasound. - peripheral vascular intervention. - melanoma 2011. Medical A Surgical History Notes nd Respiratory OSA on CPAP Cardiovascular Cronic venous insufficiency 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 Gastrointestinal liver cyst Musculoskeletal arthritis Neurologic restless leg syndrome Review of Systems (ROS) Constitutional Symptoms (General Health) Denies complaints or symptoms of Fatigue, Fever, Chills, Marked Weight Change. Eyes Denies complaints or symptoms of Dry Eyes, Vision Changes, Glasses / Contacts. Ear/Nose/Mouth/Throat Denies complaints or symptoms of Chronic sinus problems or rhinitis. Integumentary (Skin) LLE Musculoskeletal Denies complaints or symptoms of Muscle Pain, Muscle Weakness. Neurologic Denies complaints or symptoms of  Numbness/parasthesias. Oncologic melanoma Psychiatric Denies complaints or symptoms of Claustrophobia, Suicidal. Objective Constitutional Hypertensive, asymptomatic. No acute distress. Vitals Time Taken: 10:35 AM, Height: 63 in, Source: Stated, Weight: 200 lbs, Source: Stated, BMI: 35.4, Temperature: 97.7 F, Pulse: 83 bpm, Respiratory Rate: 18 breaths/min, Blood Pressure: 164/81 mmHg. Respiratory Normal work of breathing on room air.. Cardiovascular Changes consistent with chronic venous stasis. 3+ pitting edema to the left lower extremity.. General Notes: 07/27/2022: She has 2 small ulcers on her left medial lower leg. There is slough accumulation in both of them. The more anterior of the 2 is quite small and has some soft slough on the surface, underneath which there is good granulation tissue. The more medial wound also has slough accumulation, but the underlying surface is fairly fibrotic and gritty. This is consistent with her provided history of multiple ulcers in the same location. Integumentary (Hair, Skin) Wound #1 status is Open. Original cause of wound was Blister. The date acquired was: 07/05/2022. The wound is located on the Left,Medial Lower Leg. The wound measures 0.6cm length x 0.5cm width x 0.1cm depth; 0.236cm^2 area and 0.024cm^3 volume. There is no tunneling or undermining noted. There is a medium amount of serosanguineous drainage noted. There is large (67-100%) pink granulation within the wound bed. There is a small (1-33%) amount of necrotic tissue within the wound bed including Adherent Slough. Wound #2 status is Open. Original cause of wound was Blister. The date acquired was: 07/06/2022. The wound is located on the Left,Posterior Lower Leg. The wound measures 3.5cm length x 2.5cm width x 0.1cm depth; 6.872cm^2 area and 0.687cm^3 volume. There is Fat Layer (Subcutaneous Tissue) exposed. There is no tunneling or undermining noted. There is a medium amount of  serosanguineous drainage noted.  The wound margin is distinct with the outline attached to the wound base. There is medium (34-66%) red granulation within the wound bed. There is a medium (34-66%) amount of necrotic tissue within the wound bed including Adherent Slough. Assessment Active Problems ICD-10 Non-pressure chronic ulcer of other part of left lower leg with fat layer exposed Varicose veins of bilateral lower extremities with other complications Venous insufficiency (chronic) (peripheral) Polyneuropathy, unspecified Localized edema Procedures Wound #2 Pre-procedure diagnosis of Wound #2 is a Venous Leg Ulcer located on the Left,Posterior Lower Leg .Severity of Tissue Pre Debridement is: Fat layer exposed. There was a Selective/Open Wound Non-Viable Tissue Debridement with a total area of 8.75 sq cm performed by Fredirick Maudlin, MD. With the following instrument(s): Curette to remove Non-Viable tissue/material. Material removed includes [facility] after achieving pain control using Lidocaine 4% Topical Solution. No specimens were taken. A time out was conducted at 11:13, prior to the start of the procedure. A Minimum amount of bleeding was controlled with Pressure. The procedure was tolerated well with a pain level of 0 throughout and a pain level of 0 following the procedure. Post Debridement Measurements: 3.5cm length x 2.5cm width x 0.1cm depth; 0.687cm^3 volume. Character of Wound/Ulcer Post Debridement requires further debridement. Severity of Tissue Post Debridement is: Fat layer exposed. Post procedure Diagnosis Wound #2: Same as Pre-Procedure General Notes: Scribed for Dr. Celine Ahr by Blanche East, RN. Plan Follow-up Appointments: Return Appointment in 1 week. - Dr. Celine Ahr RM 4 Anesthetic: (In clinic) Topical Lidocaine 4% applied to wound bed - in clinic, prior to debridement Bathing/ Shower/ Hygiene: May shower with protection but do not get wound dressing(s) wet. Edema Control  - Lymphedema / SCD / Other: Avoid standing for long periods of time. Patient to wear own compression stockings every day. - Right lower extremity Moisturize legs daily. The following medication(s) was prescribed: lidocaine topical 4 % cream cream topical for prior to debridement was prescribed at facility WOUND #1: - Lower Leg Wound Laterality: Left, Medial Cleanser: Soap and Water Discharge Instructions: May shower and wash wound with dial antibacterial soap and water prior to dressing change. Peri-Wound Care: Sween Lotion (Moisturizing lotion) Discharge Instructions: Apply moisturizing lotion as directed Topical: Triamcinolone Discharge Instructions: Apply Triamcinolone as directed Prim Dressing: Iodosorb Gel 10 (gm) Tube ary Discharge Instructions: Apply to wound bed as instructed Secondary Dressing: Zetuvit Plus Silicone Border Dressing 4x4 (in/in) Discharge Instructions: Apply silicone border over primary dressing as directed. Secured With: 465M Medipore Public affairs consultant Surgical T 2x10 (in/yd) ape Discharge Instructions: Secure with tape as directed. Com pression Wrap: ThreePress (3 layer compression wrap) Discharge Instructions: Apply three layer compression as directed. WOUND #2: - Lower Leg Wound Laterality: Left, Posterior Cleanser: Soap and Water Discharge Instructions: May shower and wash wound with dial antibacterial soap and water prior to dressing change. Peri-Wound Care: Sween Lotion (Moisturizing lotion) Discharge Instructions: Apply moisturizing lotion as directed Topical: Triamcinolone Discharge Instructions: Apply Triamcinolone as directed Prim Dressing: Iodosorb Gel 10 (gm) Tube ary Discharge Instructions: Apply to wound bed as instructed Secondary Dressing: Zetuvit Plus Silicone Border Dressing 4x4 (in/in) Discharge Instructions: Apply silicone border over primary dressing as directed. Secured With: 465M Medipore Public affairs consultant Surgical T 2x10 (in/yd) ape Discharge  Instructions: Secure with tape as directed. Com pression Wrap: ThreePress (3 layer compression wrap) Discharge Instructions: Apply three layer compression as directed. 07/27/2022: This is a 76 year old woman who has a longstanding history of venous disease with previous complications. She has 2 small ulcers on her  left medial lower leg. There is slough accumulation in both of them. The more anterior of the 2 is quite small and has some soft slough on the surface, underneath which there is good granulation tissue. The more medial wound also has slough accumulation, but the underlying surface is fairly fibrotic and gritty. This is consistent with her provided history of multiple ulcers in the same location. I used a curette to debride slough from both of the wounds. I think she would benefit from additional chemical debridement so we will use Iodosorb under 3 layer compression. She will follow-up in 1 week. Electronic Signature(s) Signed: 07/27/2022 11:39:37 AM By: Fredirick Maudlin MD FACS Entered By: Fredirick Maudlin on 07/27/2022 11:39:37 -------------------------------------------------------------------------------- HxROS Details Patient Name: Date of Service: [name], [name] 07/27/2022 10:30 A M Medical Record Number: [id number] Patient Account Number: [id number] Date of Birth/Sex: Treating RN: [birth year] (76 y.o. [name] Primary Care Provider: [name] Other Clinician: Referring Provider: Treating Provider/Extender: [name] Session in Treatment: 0 Information Obtained From Patient Constitutional Symptoms (General Health) Complaints and Symptoms: Negative for: Fatigue; Fever; Chills; Marked Weight Change Eyes Complaints and Symptoms: Negative for: Dry Eyes; Vision Changes; Glasses / Contacts Medical History: Positive for: Cataracts - L eye Ear/Nose/Mouth/Throat Complaints and Symptoms: Negative for: Chronic sinus problems or  rhinitis Medical History: Negative for: Chronic sinus problems/congestion; Middle ear problems Musculoskeletal Complaints and Symptoms: Negative for: Muscle Pain; Muscle Weakness Medical History: Past Medical History Notes: arthritis Neurologic Complaints and Symptoms: Negative for: Numbness/parasthesias Medical History: Positive for: Neuropathy - Feet and finger Negative for: Seizure Disorder Past Medical History Notes: restless leg syndrome Psychiatric Complaints and Symptoms: Negative for: Claustrophobia; Suicidal Hematologic/Lymphatic Medical History: Negative for: Anemia; Human Immunodeficiency Virus; Lymphedema Respiratory Medical History: Positive for: Asthma Past Medical History Notes: OSA on CPAP Cardiovascular Medical History: Positive for: Hypertension Past Medical History Notes: Cronic venous insufficiency 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 Gastrointestinal Medical History: Past Medical History Notes: liver cyst Endocrine Medical History: Negative for: Type I Diabetes; Type II Diabetes Integumentary (Skin) Complaints and Symptoms: Review of System Notes: LLE Oncologic Complaints and Symptoms: Review of System Notes: melanoma Medical History: Positive for: Received Chemotherapy - 2012; Received Radiation - 2012 HBO Extended History Items Eyes: Cataracts Immunizations Pneumococcal Vaccine: Received Pneumococcal Vaccination: Yes Received Pneumococcal Vaccination On or After 60th Birthday: Yes Implantable Devices No devices added Hospitalization / Surgery History Type of Hospitalization/Surgery Lower extremity venography 2020 Intravascular ultrasound peripheral vascular intervention melanoma 2011 Family and Social History Cancer: Yes - Mother,Paternal Grandparents; Diabetes: Yes - Siblings; Heart Disease: Yes - Father; Hypertension: Yes - Father; Former smoker - quit in [place] I have reviewed  and agree with the above information. Electronic Signature(s) Signed: 07/27/2022 10:55:56 AM By: Fredirick Maudlin MD FACS Signed: 07/27/2022 4:59:13 PM By: Blanche East RN Entered By: Blanche East on 07/27/2022 10:55:04 -------------------------------------------------------------------------------- SuperBill Details Patient Name: Date of Service: [name], [name] 07/27/2022 Medical Record Number: [id number] Patient Account Number: [id number] Date of Birth/Sex: Treating RN: 1945-12-30 (76 y.o. [name] Primary Care Provider: [name] Other Clinician: Referring Provider: Treating Provider/Extender: Pixie Casino Weeks in Treatment: 0 Diagnosis Coding ICD-10 Codes Code Description [phone] Non-pressure chronic ulcer of other part of left lower leg with fat layer exposed I83.893 Varicose veins of bilateral lower extremities with other complications J82.5 Venous insufficiency (chronic) (peripheral) G62.9 Polyneuropathy, unspecified R60.0 Localized edema Facility Procedures CPT4 Code: 05397673 Description: 41937 - WOUND CARE VISIT-LEV 3 EST PT  Modifier: 25 Quantity: 1 CPT4 Code: [id number] Description: 14103 - DEBRIDE WOUND 1ST 20 SQ CM OR < ICD-10 Diagnosis Description L97.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 Modifier: d Quantity: 1 Physician Procedures : CPT4 Code Description Modifier [id number] - WC PHYS LEVEL 4 - NEW PT 25 ICD-10 Diagnosis Description L97.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d I83.893 Varicose veins of bilateral lower extremities with  other complications O87.5 Venous insufficiency (chronic) (peripheral) R60.0 Localized edema [id number] - WC PHYS DEBR WO ANESTH 20 SQ CM 1 ICD-10 Diagnosis Description L97.822 Non-pressure chronic ulcer of other part of left lower leg with fat layer  exposed Quantity: 1 Electronic  Signature(s) Signed: 07/27/2022 11:54:28 AM By: Fredirick Maudlin MD FACS Signed: 07/27/2022 4:59:13 PM By: Blanche East RN Previous Signature: 07/27/2022 11:40:01 AM Version By: Fredirick Maudlin MD FACS Entered By: Blanche East on 07/27/2022 11:44:37

## 2022-08-02 ENCOUNTER — Encounter: Payer: Self-pay | Admitting: Podiatry

## 2022-08-02 ENCOUNTER — Ambulatory Visit (INDEPENDENT_AMBULATORY_CARE_PROVIDER_SITE_OTHER): Payer: Medicare Other | Admitting: Podiatry

## 2022-08-02 DIAGNOSIS — G629 Polyneuropathy, unspecified: Secondary | ICD-10-CM | POA: Diagnosis not present

## 2022-08-02 DIAGNOSIS — M79674 Pain in right toe(s): Secondary | ICD-10-CM

## 2022-08-02 DIAGNOSIS — I872 Venous insufficiency (chronic) (peripheral): Secondary | ICD-10-CM

## 2022-08-02 DIAGNOSIS — B351 Tinea unguium: Secondary | ICD-10-CM

## 2022-08-02 DIAGNOSIS — M79675 Pain in left toe(s): Secondary | ICD-10-CM

## 2022-08-02 NOTE — Progress Notes (Signed)
This patient returns to my office for at risk foot care.  This patient requires this care by a professional since this patient will be at risk due to having chronic venous insufficiency.    She said she developed an infection to her foot and leg requiring IV hospital antibiotics..  Wearing compression sock left  leg..  Patient is taking trental.This patient is unable to cut nails herself  since the patient cannot reach her  nails.These nails are painful walking and wearing shoes.  This patient presents for at risk foot care today.  General Appearance  Alert, conversant and in no acute stress.  Vascular  Dorsalis pedis and posterior tibial pulses are palpable.    Neurologic  Senn-Weinstein  WNL   diminished   Nails Thick disfigured discolored nails with subungual debris  from hallux to fifth toes bilaterally. No evidence of bacterial infection or drainage bilaterally. Pincer hallux nails  B/L.   Orthopedic  No limitations of motion  feet .  No crepitus or effusions noted.  No bony pathology or digital deformities noted. DJD [name]  B/L.  Skin  normotropic skin with no porokeratosis noted bilaterally.  No signs of infections or ulcers noted.   Unknown swelling dorsum of right foot.  Onychomycosis  Pain in right toes  Pain in left toes  Consent was obtained for treatment procedures.   Mechanical debridement of nails 1-5  bilaterally performed with a nail nipper.  Filed with dremel without incident. No infection or ulcer.     Return office visit   12   weeks        Told patient to return for periodic foot care and evaluation due to potential at risk complications.   Gardiner Barefoot DPM

## 2022-08-03 ENCOUNTER — Encounter (HOSPITAL_BASED_OUTPATIENT_CLINIC_OR_DEPARTMENT_OTHER): Payer: Medicare Other | Admitting: General Surgery

## 2022-08-03 DIAGNOSIS — Z87891 Personal history of nicotine dependence: Secondary | ICD-10-CM | POA: Diagnosis not present

## 2022-08-03 DIAGNOSIS — I872 Venous insufficiency (chronic) (peripheral): Secondary | ICD-10-CM | POA: Diagnosis not present

## 2022-08-03 DIAGNOSIS — I83893 Varicose veins of bilateral lower extremities with other complications: Secondary | ICD-10-CM | POA: Diagnosis not present

## 2022-08-03 DIAGNOSIS — L97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d: Secondary | ICD-10-CM | POA: Diagnosis not present

## 2022-08-03 DIAGNOSIS — G629 Polyneuropathy, unspecified: Secondary | ICD-10-CM | POA: Diagnosis not present

## 2022-08-03 DIAGNOSIS — R600 Localized edema: Secondary | ICD-10-CM | POA: Diagnosis not present

## 2022-08-03 DIAGNOSIS — L97222 Non-pressure chronic ulcer of left calf with fat layer exposed: Secondary | ICD-10-CM | POA: Diagnosis not present

## 2022-08-03 NOTE — Progress Notes (Signed)
[name] ([id number]) Visit Report for 08/03/2022 Chief Complaint Document Details Patient Name: Date of Service: [name], [name] 08/03/2022 1:45 PM Medical Record Number: [id number] Patient Account Number: [id number] Date of Birth/Sex: Treating RN: [birth year] (76 y.o. [name] Primary Care Provider: Ria [name] Other Clinician: Referring Provider: Treating Provider/Extender: [name] Session in Treatment: 1 Information Obtained from: Patient Chief Complaint Patient presents for treatment of an open ulcer due to venous insufficiency Electronic Signature(s) Signed: 08/03/2022 2:27:10 PM By: Fredirick Maudlin MD FACS Entered By: Fredirick Maudlin on 08/03/2022 14:27:10 -------------------------------------------------------------------------------- Debridement Details Patient Name: Date of Service: [name], [name] 08/03/2022 1:45 PM Medical Record Number: [id number] Patient Account Number: [id number] Date of Birth/Sex: Treating RN: [birth year] (76 y.o. [name], [name] Primary Care Provider: Ria [name] Other Clinician: Referring Provider: Treating Provider/Extender: [name] Session in Treatment: 1 Debridement Performed for Assessment: Wound #1 Left,Medial Lower Leg Performed By: Physician Fredirick Maudlin, MD Debridement Type: Debridement Severity of Tissue Pre Debridement: Fat layer exposed Level of Consciousness (Pre-procedure): Awake and Alert Pre-procedure Verification/Time Out Yes - 14:19 Taken: Start Time: 14:20 T Area Debrided (L x W): otal 0.6 (cm) x 0.4 (cm) = 0.24 (cm) Tissue and other material debrided: Non-Viable, Eschar, Slough, Slough Level: Non-Viable Tissue Debridement Description: Selective/Open Wound Instrument: Curette Bleeding: Minimum Hemostasis Achieved: Pressure Procedural Pain: 0 Post Procedural Pain: 0 Response to Treatment: Procedure was tolerated well Level of Consciousness  (Post- Awake and Alert procedure): Post Debridement Measurements of Total Wound Length: (cm) 0.6 Width: (cm) 0.4 Depth: (cm) 0.1 Volume: (cm) 0.019 Character of Wound/Ulcer Post Debridement: Requires Further Debridement Severity of Tissue Post Debridement: Fat layer exposed Post Procedure Diagnosis Same as Pre-procedure Notes Scribed for Dr. Celine Ahr by Blanche East, RN Electronic Signature(s) Signed: 08/03/2022 3:14:13 PM By: Fredirick Maudlin MD FACS Signed: 08/03/2022 4:38:59 PM By: Blanche East RN Entered By: Blanche East on 08/03/2022 14:21:15 -------------------------------------------------------------------------------- Debridement Details Patient Name: Date of Service: [name], [name] 08/03/2022 1:45 PM Medical Record Number: [id number] Patient Account Number: [id number] Date of Birth/Sex: Treating RN: 1946/06/08 (76 y.o. [name], [name] Primary Care Provider: Ria [name] Other Clinician: Referring Provider: Treating Provider/Extender: [name] Session in Treatment: 1 Debridement Performed for Assessment: Wound #2 Left,Posterior Lower Leg Performed By: Physician Fredirick Maudlin, MD Debridement Type: Debridement Severity of Tissue Pre Debridement: Fat layer exposed Level of Consciousness (Pre-procedure): Awake and Alert Pre-procedure Verification/Time Out Yes - 14:19 Taken: Start Time: 14:20 Pain Control: Lidocaine 5% topical ointment T Area Debrided (L x W): otal 3.2 (cm) x 2.4 (cm) = 7.68 (cm) Tissue and other material debrided: Non-Viable, Slough, Slough Level: Non-Viable Tissue Debridement Description: Selective/Open Wound Instrument: Curette Bleeding: Minimum Hemostasis Achieved: Pressure Procedural Pain: 0 Post Procedural Pain: 0 Response to Treatment: Procedure was tolerated well Level of Consciousness (Post- Awake and Alert procedure): Post Debridement Measurements of Total Wound Length: (cm) 3.2 Width: (cm)  2.4 Depth: (cm) 0.2 Volume: (cm) 1.206 Character of Wound/Ulcer Post Debridement: Requires Further Debridement Severity of Tissue Post Debridement: Fat layer exposed Post Procedure Diagnosis Same as Pre-procedure Notes Scribed for Dr. Celine Ahr by Blanche East, RN Electronic Signature(s) Signed: 08/03/2022 3:14:13 PM By: Fredirick Maudlin MD FACS Signed: 08/03/2022 4:38:59 PM By: Blanche East RN Entered By: Blanche East on 08/03/2022 14:22:35 -------------------------------------------------------------------------------- HPI Details Patient Name: Date of Service: [name], [name] 08/03/2022 1:45 PM Medical Record Number: [id number] Patient Account Number: [id number] Date of Birth/Sex: Treating RN: 05/27/1946 (76 y.o. [name] Primary Care Provider:  Ria [name] Other Clinician: Referring Provider: Treating Provider/Extender: [name] Session in Treatment: 1 History of Present Illness HPI Description: ADMISSION This is a 76 year old woman with a history of chronic venous insufficiency status post saphenous vein ablations in 2010 and 2016. 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 She presents today with an open venous ulcer on her left lower leg. It has been present for a little over 2 weeks. She saw her primary care provider who apparently swabbed the wound and grew out Pseudomonas. She just completed a course of ciprofloxacin for this. ABI was 0.91. She reports that she has had previous issues with ulcers in this same location, stemming back to a punch biopsy taken by a dermatologist many years ago. She has had several skin substitutes applied to the area that have ultimately resulted in healing on prior occasions. She has 2 small ulcers on her left medial lower leg. There is slough accumulation in both of them. The more anterior of the 2 is quite small and has some soft slough on the surface, underneath which there is good  granulation tissue. The more medial wound also has slough accumulation, but the underlying surface is fairly fibrotic and gritty. This is consistent with her provided history of multiple ulcers in the same location. 08/03/2022: The anterior wound is smaller today with just a little bit of slough accumulation. The more medial wound continues to be very sensitive and fibrotic with slough buildup. Electronic Signature(s) Signed: 08/03/2022 2:28:07 PM By: Fredirick Maudlin MD FACS Entered By: Fredirick Maudlin on 08/03/2022 14:28:07 -------------------------------------------------------------------------------- Physical Exam Details Patient Name: Date of Service: [name], [name] 08/03/2022 1:45 PM Medical Record Number: [id number] Patient Account Number: [id number] Date of Birth/Sex: Treating RN: 1946/02/09 (76 y.o. [name] Primary Care Provider: Ria [name] Other Clinician: Referring Provider: Treating Provider/Extender: Pixie Casino Weeks in Treatment: 1 Constitutional Hypertensive, asymptomatic. . . . No acute distress.Marland Kitchen Respiratory . Notes 08/03/2022: The anterior wound is smaller today with just a little bit of slough accumulation. The more medial wound continues to be very sensitive and fibrotic with slough buildup. Electronic Signature(s) Signed: 08/03/2022 2:28:34 PM By: Fredirick Maudlin MD FACS Entered By: Fredirick Maudlin on 08/03/2022 14:28:34 -------------------------------------------------------------------------------- Physician Orders Details Patient Name: Date of Service: [name], [name] 08/03/2022 1:45 PM Medical Record Number: [id number] Patient Account Number: [id number] Date of Birth/Sex: Treating RN: 02-06-1946 (76 y.o. [name], [name] Primary Care Provider: Ria [name] Other Clinician: Referring Provider: Treating Provider/Extender: [name] Session in Treatment: 1 Verbal / Phone Orders:  No Diagnosis Coding ICD-10 Coding Code Description [phone] Non-pressure chronic ulcer of other part of left lower leg with fat layer exposed I83.893 Varicose veins of bilateral lower extremities with other complications A07.6 Venous insufficiency (chronic) (peripheral) G62.9 Polyneuropathy, unspecified R60.0 Localized edema Follow-up Appointments ppointment in 1 week. - Dr. Celine Ahr RM 4 Return A Anesthetic Wound #1 Left,Medial Lower Leg (In clinic) Topical Lidocaine 5% applied to wound bed - in clinic, prior to debridement Wound #2 Left,Posterior Lower Leg (In clinic) Topical Lidocaine 5% applied to wound bed - in clinic, prior to debridement Bathing/ Shower/ Hygiene May shower with protection but do not get wound dressing(s) wet. Edema Control - Lymphedema / SCD / Other Left Lower Extremity Avoid standing for long periods of time. Patient to wear own compression stockings every day. - Right lower extremity Moisturize legs daily. Wound Treatment Wound #1 - Lower Leg Wound Laterality: Left, Medial Cleanser: Soap  and Water Discharge Instructions: May shower and wash wound with dial antibacterial soap and water prior to dressing change. Peri-Wound Care: Sween Lotion (Moisturizing lotion) Discharge Instructions: Apply moisturizing lotion as directed Topical: Triamcinolone Discharge Instructions: Apply Triamcinolone as directed Prim Dressing: KerraCel Ag Gelling Fiber Dressing, 2x2 in (silver alginate) ary Discharge Instructions: Apply silver alginate to wound bed as instructed Secondary Dressing: Zetuvit Plus Silicone Border Dressing 4x4 (in/in) Discharge Instructions: Apply silicone border over primary dressing as directed. Secured With: 21M Medipore Public affairs consultant Surgical T 2x10 (in/yd) ape Discharge Instructions: Secure with tape as directed. Compression Wrap: ThreePress (3 layer compression wrap) Discharge Instructions: Apply three layer compression as directed. Wound #2 - Lower  Leg Wound Laterality: Left, Posterior Cleanser: Soap and Water Discharge Instructions: May shower and wash wound with dial antibacterial soap and water prior to dressing change. Peri-Wound Care: Sween Lotion (Moisturizing lotion) Discharge Instructions: Apply moisturizing lotion as directed Topical: Triamcinolone Discharge Instructions: Apply Triamcinolone as directed Prim Dressing: Santyl Ointment ary Discharge Instructions: Apply nickel thick amount to wound bed as instructed Secondary Dressing: Zetuvit Plus Silicone Border Dressing 4x4 (in/in) Discharge Instructions: Apply silicone border over primary dressing as directed. Secured With: 21M Medipore Public affairs consultant Surgical T 2x10 (in/yd) ape Discharge Instructions: Secure with tape as directed. Compression Wrap: ThreePress (3 layer compression wrap) Discharge Instructions: Apply three layer compression as directed. Electronic Signature(s) Signed: 08/03/2022 3:14:13 PM By: Fredirick Maudlin MD FACS Signed: 08/03/2022 4:38:59 PM By: Blanche East RN Entered By: Blanche East on 08/03/2022 14:49:28 -------------------------------------------------------------------------------- Problem List Details Patient Name: Date of Service: [name], [name] 08/03/2022 1:45 PM Medical Record Number: [id number] Patient Account Number: [id number] Date of Birth/Sex: Treating RN: August 10, 1946 (76 y.o. [name] Primary Care Provider: Ria [name] Other Clinician: Referring Provider: Treating Provider/Extender: [name] Session in Treatment: 1 Active Problems ICD-10 Encounter Code Description Active Date MDM Diagnosis [phone] Non-pressure chronic ulcer of other part of left lower leg with fat layer exposed 07/27/2022 No Yes I83.893 Varicose veins of bilateral lower extremities with other complications 0/56/9794 No Yes I87.2 Venous insufficiency (chronic) (peripheral) 07/27/2022 No Yes G62.9 Polyneuropathy, unspecified  07/27/2022 No Yes R60.0 Localized edema 07/27/2022 No Yes Inactive Problems Resolved Problems Electronic Signature(s) Signed: 08/03/2022 2:26:56 PM By: Fredirick Maudlin MD FACS Entered By: Fredirick Maudlin on 08/03/2022 14:26:56 -------------------------------------------------------------------------------- Progress Note Details Patient Name: Date of Service: [name] 08/03/2022 1:45 PM Medical Record Number: [id number] Patient Account Number: [id number] Date of Birth/Sex: Treating RN: 02-Feb-1946 (76 y.o. [name] Primary Care Provider: Ria [name] Other Clinician: Referring Provider: Treating Provider/Extender: [name] Session in Treatment: 1 Subjective Chief Complaint Information obtained from Patient Patient presents for treatment of an open ulcer due to venous insufficiency History of Present Illness (HPI) ADMISSION This is a 76 year old woman with a history of chronic venous insufficiency status post saphenous vein ablations in 2010 and 2016. 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 She presents today with an open venous ulcer on her left lower leg. It has been present for a little over 2 weeks. She saw her primary care provider who apparently swabbed the wound and grew out Pseudomonas. She just completed a course of ciprofloxacin for this. ABI was 0.91. She reports that she has had previous issues with ulcers in this same location, stemming back to a punch biopsy taken by a dermatologist many years ago. She has had several skin substitutes applied to the area that have ultimately resulted in healing  on prior occasions. She has 2 small ulcers on her left medial lower leg. There is slough accumulation in both of them. The more anterior of the 2 is quite small and has some soft slough on the surface, underneath which there is good granulation tissue. The more medial wound also has slough accumulation, but the  underlying surface is fairly fibrotic and gritty. This is consistent with her provided history of multiple ulcers in the same location. 08/03/2022: The anterior wound is smaller today with just a little bit of slough accumulation. The more medial wound continues to be very sensitive and fibrotic with slough buildup. Patient History Information obtained from Patient. Family History Cancer - Mother,Paternal Grandparents, Diabetes - Siblings, Heart Disease - Father, Hypertension - Father. Social History Former smoker - quit in 1967. Medical History Eyes Patient has history of Cataracts - L eye Ear/Nose/Mouth/Throat Denies history of Chronic sinus problems/congestion, Middle ear problems Hematologic/Lymphatic Denies history of Anemia, Human Immunodeficiency Virus, Lymphedema Respiratory Patient has history of Asthma Cardiovascular Patient has history of Hypertension Endocrine Denies history of Type I Diabetes, Type II Diabetes Neurologic Patient has history of Neuropathy - Feet and finger Denies history of Seizure Disorder Oncologic Patient has history of Received Chemotherapy - 2012, Received Radiation - 2012 Hospitalization/Surgery History - Lower extremity venography 2020. - Intravascular ultrasound. - peripheral vascular intervention. - melanoma 2011. 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 No acute distress.. Vitals Time Taken: 2:05 PM, Height: 63 in, Weight: 200 lbs, BMI: 35.4, Temperature: 98.1 F, Pulse: 77 bpm, Respiratory Rate: 18 breaths/min, Blood Pressure: 180/93 mmHg. General Notes: 08/03/2022: The anterior wound is smaller today with just a little bit of slough accumulation. The more medial wound  continues to be very sensitive and fibrotic with slough buildup. Integumentary (Hair, Skin) Wound #1 status is Open. Original cause of wound was Blister. The date acquired was: 07/05/2022. The wound has been in treatment 1 weeks. The wound is located on the Left,Medial Lower Leg. The wound measures 0.6cm length x 0.4cm width x 0.1cm depth; 0.188cm^2 area and 0.019cm^3 volume. There is Fat Layer (Subcutaneous Tissue) exposed. There is no tunneling or undermining noted. There is a medium amount of serosanguineous drainage noted. The wound margin is flat and intact. There is small (1-33%) pink granulation within the wound bed. There is a large (67-100%) amount of necrotic tissue within the wound bed including Adherent Slough. Wound #2 status is Open. Original cause of wound was Blister. The date acquired was: 07/06/2022. The wound has been in treatment 1 weeks. The wound is located on the Left,Posterior Lower Leg. The wound measures 3.2cm length x 2.4cm width x 0.2cm depth; 6.032cm^2 area and 1.206cm^3 volume. There is Fat Layer (Subcutaneous Tissue) exposed. There is no tunneling or undermining noted. There is a medium amount of serosanguineous drainage noted. The wound margin is distinct with the outline attached to the wound base. There is medium (34-66%) red granulation within the wound bed. There is a medium (34-66%) amount of necrotic tissue within the wound bed including Adherent Slough. Assessment Active Problems ICD-10 Non-pressure chronic ulcer of other part of left lower leg with fat layer exposed Varicose veins of bilateral lower extremities with other complications Venous insufficiency (chronic) (peripheral) Polyneuropathy, unspecified Localized edema Procedures Wound #1 Pre-procedure diagnosis of Wound #1 is a Venous Leg Ulcer located on  the Left,Medial Lower Leg .Severity of Tissue Pre Debridement is: Fat layer exposed. There was a Selective/Open Wound Non-Viable Tissue Debridement  with a total area of 0.24 sq cm performed by Fredirick Maudlin, MD. With the following instrument(s): Curette to remove Non-Viable tissue/material. Material removed includes Eschar and Slough and. No specimens were taken. A time out was conducted at 14:19, prior to the start of the procedure. A Minimum amount of bleeding was controlled with Pressure. The procedure was tolerated well with a pain level of 0 throughout and a pain level of 0 following the procedure. Post Debridement Measurements: 0.6cm length x 0.4cm width x 0.1cm depth; 0.019cm^3 volume. Character of Wound/Ulcer Post Debridement requires further debridement. Severity of Tissue Post Debridement is: Fat layer exposed. Post procedure Diagnosis Wound #1: Same as Pre-Procedure General Notes: Scribed for Dr. Celine Ahr by Blanche East, RN. Wound #2 Pre-procedure diagnosis of Wound #2 is a Venous Leg Ulcer located on the Left,Posterior Lower Leg .Severity of Tissue Pre Debridement is: Fat layer exposed. There was a Selective/Open Wound Non-Viable Tissue Debridement with a total area of 7.68 sq cm performed by Fredirick Maudlin, MD. With the following instrument(s): Curette to remove Non-Viable tissue/material. Material removed includes [facility] after achieving pain control using Lidocaine 5% topical ointment. No specimens were taken. A time out was conducted at 14:19, prior to the start of the procedure. A Minimum amount of bleeding was controlled with Pressure. The procedure was tolerated well with a pain level of 0 throughout and a pain level of 0 following the procedure. Post Debridement Measurements: 3.2cm length x 2.4cm width x 0.2cm depth; 1.206cm^3 volume. Character of Wound/Ulcer Post Debridement requires further debridement. Severity of Tissue Post Debridement is: Fat layer exposed. Post procedure Diagnosis Wound #2: Same as Pre-Procedure General Notes: Scribed for Dr. Celine Ahr by Blanche East, RN. Pre-procedure diagnosis of Wound #2 is a  Venous Leg Ulcer located on the Left,Posterior Lower Leg . There was a Three Layer Compression Therapy Procedure by Blanche East, RN. Post procedure Diagnosis Wound #2: Same as Pre-Procedure Plan Follow-up Appointments: Return Appointment in 1 week. - Dr. Celine Ahr RM 4 Anesthetic: (In clinic) Topical Lidocaine 4% applied to wound bed - in clinic, prior to debridement Bathing/ Shower/ Hygiene: May shower with protection but do not get wound dressing(s) wet. Edema Control - Lymphedema / SCD / Other: Avoid standing for long periods of time. Patient to wear own compression stockings every day. - Right lower extremity Moisturize legs daily. WOUND #1: - Lower Leg Wound Laterality: Left, Medial Cleanser: Soap and Water Discharge Instructions: May shower and wash wound with dial antibacterial soap and water prior to dressing change. Peri-Wound Care: Sween Lotion (Moisturizing lotion) Discharge Instructions: Apply moisturizing lotion as directed Topical: Triamcinolone Discharge Instructions: Apply Triamcinolone as directed Prim Dressing: KerraCel Ag Gelling Fiber Dressing, 2x2 in (silver alginate) ary Discharge Instructions: Apply silver alginate to wound bed as instructed Secondary Dressing: Zetuvit Plus Silicone Border Dressing 4x4 (in/in) Discharge Instructions: Apply silicone border over primary dressing as directed. Secured With: 61M Medipore Public affairs consultant Surgical T 2x10 (in/yd) ape Discharge Instructions: Secure with tape as directed. Com pression Wrap: ThreePress (3 layer compression wrap) Discharge Instructions: Apply three layer compression as directed. WOUND #2: - Lower Leg Wound Laterality: Left, Posterior Cleanser: Soap and Water Discharge Instructions: May shower and wash wound with dial antibacterial soap and water prior to dressing change. Peri-Wound Care: Sween Lotion (Moisturizing lotion) Discharge Instructions: Apply moisturizing lotion as directed Topical:  Triamcinolone Discharge Instructions: Apply Triamcinolone  as directed Prim Dressing: Santyl Ointment ary Discharge Instructions: Apply nickel thick amount to wound bed as instructed Secondary Dressing: Zetuvit Plus Silicone Border Dressing 4x4 (in/in) Discharge Instructions: Apply silicone border over primary dressing as directed. Secured With: 48M Medipore Public affairs consultant Surgical T 2x10 (in/yd) ape Discharge Instructions: Secure with tape as directed. Com pression Wrap: ThreePress (3 layer compression wrap) Discharge Instructions: Apply three layer compression as directed. 08/03/2022: The anterior wound is smaller today with just a little bit of slough accumulation. The more medial wound continues to be very sensitive and fibrotic with slough buildup. I used a curette to debride slough from both of the wounds. Debridement of the more medial wound was limited secondary to patient discomfort. I am going to change to [facility] in this location for ongoing enzymatic debridement. Continue silver alginate to the anterior wound with 3 layer compression on the leg. Follow-up in 1 week. Electronic Signature(s) Signed: 08/03/2022 2:31:30 PM By: Fredirick Maudlin MD FACS Entered By: Fredirick Maudlin on 08/03/2022 14:31:30 -------------------------------------------------------------------------------- HxROS Details Patient Name: Date of Service: [name], [name] 08/03/2022 1:45 PM Medical Record Number: [id number] Patient Account Number: [id number] Date of Birth/Sex: Treating RN: 05-Aug-1946 (76 y.o. [name] Primary Care Provider: Ria [name] Other Clinician: Referring Provider: Treating Provider/Extender: [name] Session in Treatment: 1 Information Obtained From Patient Eyes Medical History: Positive for: Cataracts - L eye Ear/Nose/Mouth/Throat Medical History: Negative for: Chronic sinus problems/congestion; Middle ear problems Hematologic/Lymphatic Medical  History: Negative for: Anemia; Human Immunodeficiency Virus; Lymphedema Respiratory Medical History: Positive for: Asthma Past Medical History Notes: OSA on CPAP Cardiovascular Medical History: Positive for: Hypertension Past Medical History Notes: Cronic venous insufficiency 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 Gastrointestinal Medical History: Past Medical History Notes: liver cyst Endocrine Medical History: Negative for: Type I Diabetes; Type II Diabetes Musculoskeletal Medical History: Past Medical History Notes: arthritis Neurologic Medical History: Positive for: Neuropathy - Feet and finger Negative for: Seizure Disorder Past Medical History Notes: restless leg syndrome Oncologic Medical History: Positive for: Received Chemotherapy - 2012; Received Radiation - 2012 HBO Extended History Items Eyes: Cataracts Immunizations Pneumococcal Vaccine: Received Pneumococcal Vaccination: Yes Received Pneumococcal Vaccination On or After 60th Birthday: Yes Implantable Devices No devices added Hospitalization / Surgery History Type of Hospitalization/Surgery Lower extremity venography 2020 Intravascular ultrasound peripheral vascular intervention melanoma 2011 Family and Social History Cancer: Yes - Mother,Paternal Grandparents; Diabetes: Yes - Siblings; Heart Disease: Yes - Father; Hypertension: Yes - Father; Former smoker - quit in [employer]) Signed: 08/03/2022 3:14:13 PM By: Fredirick Maudlin MD FACS Signed: 08/03/2022 4:38:59 PM By: Blanche East RN Signed: 08/03/2022 4:38:59 PM By: Blanche East RN Entered By: Fredirick Maudlin on 08/03/2022 14:28:12 -------------------------------------------------------------------------------- SuperBill Details Patient Name: Date of Service: [name], [name] 08/03/2022 Medical Record Number: [id number] Patient Account Number: [id number] Date of Birth/Sex: Treating RN: May 04, 1946 (76  y.o. [name], [name] Primary Care Provider: Ria [name] Other Clinician: Referring Provider: Treating Provider/Extender: [name] Session in Treatment: 1 Diagnosis Coding ICD-10 Codes Code Description [phone] Non-pressure chronic ulcer of other part of left lower leg with fat layer exposed I83.893 Varicose veins of bilateral lower extremities with other complications Q68.3 Venous insufficiency (chronic) (peripheral) G62.9 Polyneuropathy, unspecified R60.0 Localized edema Facility Procedures CPT4 Code: [id number] Description: [id number] - DEBRIDE WOUND 1ST 20 SQ CM OR < ICD-10 Diagnosis Description L97.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 Modifier: d Quantity: 1 Physician Procedures : CPT4 Code  Description Modifier [id number] - WC PHYS LEVEL 4 - EST PT 25 ICD-10 Diagnosis Description L97.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d I87.2 Venous insufficiency (chronic) (peripheral) G10.254  Varicose veins of bilateral lower extremities with other complications C62.8 Localized edema Quantity: 1 : [id number] - WC PHYS DEBR WO ANESTH 20 SQ CM ICD-10 Diagnosis Description L97.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d Quantity: 1 Electronic Signature(s) Signed: 08/03/2022 2:31:46 PM By: Fredirick Maudlin MD FACS Entered By: Fredirick Maudlin on 08/03/2022 14:31:46

## 2022-08-08 ENCOUNTER — Telehealth: Payer: Self-pay | Admitting: *Deleted

## 2022-08-08 ENCOUNTER — Encounter (HOSPITAL_BASED_OUTPATIENT_CLINIC_OR_DEPARTMENT_OTHER): Payer: Medicare Other | Attending: General Surgery | Admitting: General Surgery

## 2022-08-08 DIAGNOSIS — G629 Polyneuropathy, unspecified: Secondary | ICD-10-CM | POA: Diagnosis not present

## 2022-08-08 DIAGNOSIS — H2511 Age-related nuclear cataract, right eye: Secondary | ICD-10-CM | POA: Diagnosis not present

## 2022-08-08 DIAGNOSIS — Z87891 Personal history of nicotine dependence: Secondary | ICD-10-CM | POA: Insufficient documentation

## 2022-08-08 DIAGNOSIS — I83893 Varicose veins of bilateral lower extremities with other complications: Secondary | ICD-10-CM | POA: Insufficient documentation

## 2022-08-08 DIAGNOSIS — R600 Localized edema: Secondary | ICD-10-CM | POA: Diagnosis not present

## 2022-08-08 DIAGNOSIS — I872 Venous insufficiency (chronic) (peripheral): Secondary | ICD-10-CM | POA: Diagnosis not present

## 2022-08-08 DIAGNOSIS — L97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d: Secondary | ICD-10-CM | POA: Diagnosis not present

## 2022-08-08 NOTE — Progress Notes (Signed)
RASEEL, JANS ([id number]) Visit Report for 08/08/2022 Arrival Information Details Patient Name: Date of Service: [name], [name] 08/08/2022 8:30 A M Medical Record Number: [id number] Patient Account Number: [id number] Date of Birth/Sex: Treating RN: 1946-02-20 (76 y.o. Donney Rankins, Lovena Le Primary Care Marvie Brevik: [name] Other Clinician: Referring Teyana Pierron: Treating Jalaina Salyers/Extender: [name] Session in Treatment: 1 Visit Information History Since Last Visit Added or deleted any medications: No Patient Arrived: Ambulatory Any new allergies or adverse reactions: No Arrival Time: 08:54 Had a fall or experienced change in No Accompanied By: self activities of daily living that may affect Transfer Assistance: None risk of falls: Patient Identification Verified: Yes Signs or symptoms of abuse/neglect since last visito No Secondary Verification Process Completed: Yes Hospitalized since last visit: No Patient Requires Transmission-Based Precautions: No Implantable device outside of the clinic excluding No Patient Has Alerts: No cellular tissue based products placed in the center since last visit: Has Dressing in Place as Prescribed: Yes Has Compression in Place as Prescribed: No Pain Present Now: Yes Electronic Signature(s) Signed: 08/08/2022 4:51:18 PM By: Adline Peals Entered By: Adline Peals on 08/08/2022 09:08:28 -------------------------------------------------------------------------------- Compression Therapy Details Patient Name: Date of Service: [name] 08/08/2022 8:30 A M Medical Record Number: [id number] Patient Account Number: [id number] Date of Birth/Sex: Treating RN: 06-04-1946 (76 y.o. [name] Primary Care Tifini Reeder: [name] Other Clinician: Referring Diksha Tagliaferro: Treating Gabriel Paulding/Extender: [name] Session in Treatment: 1 Compression Therapy Performed for Wound  Assessment: Wound #2 Ed[place]on Lower Leg Performed By: Clinician Adline Peals, RN Compression Type: Three Layer Electronic Signature(s) Signed: 08/08/2022 4:51:18 PM By: Adline Peals Entered By: Adline Peals on 08/08/2022 09:09:29 -------------------------------------------------------------------------------- Encounter Discharge Information Details Patient Name: Date of Service: [name], [name] 08/08/2022 8:30 A M Medical Record Number: [id number] Patient Account Number: [id number] Date of Birth/Sex: Treating RN: Mar 04, 1946 (76 y.o. [name] Primary Care Kenai Fluegel: [name] Other Clinician: Referring Ellyana Crigler: Treating Dajuana Palen/Extender: [name] Session in Treatment: 1 Encounter Discharge Information Items Discharge Condition: Stable Ambulatory Status: Ambulatory Discharge Destination: Home Transportation: Private Auto Accompanied By: self Schedule Follow-up Appointment: Yes Clinical Summary of Care: Patient Declined Electronic Signature(s) Signed: 08/08/2022 4:51:18 PM By: Sabas Sous By: Adline Peals on 08/08/2022 09:09:57 -------------------------------------------------------------------------------- Patient/Caregiver Education Details Patient Name: Date of Service: [name] 10/4/2023andnbsp8:30 A M Medical Record Number: [id number] Patient Account Number: [id number] Date of Birth/Gender: Treating RN: [birth year] (76 y.o. [name] Primary Care Physician: [name] Other Clinician: Referring Physician: Treating Physician/Extender: [name] Session in Treatment: 1 Education Assessment Education Provided To: Patient Education Topics Provided Wound/Skin Impairment: Methods: Explain/Verbal Responses: Reinforcements needed, State content correctly Electronic Signature(s) Signed: 08/08/2022 4:51:18 PM By: Adline Peals Entered  By: Adline Peals on 08/08/2022 09:09:47 -------------------------------------------------------------------------------- Wound Assessment Details Patient Name: Date of Service: [name], [name] 08/08/2022 8:30 A M Medical Record Number: [id number] Patient Account Number: [id number] Date of Birth/Sex: Treating RN: Mar 04, 1946 (76 y.o. [name] Primary Care Tyona Nilsen: [name] Other Clinician: Referring Tyrin Herbers: Treating Torrin Crihfield/Extender: Pixie Casino Weeks in Treatment: 1 Wound Status Wound Number: 1 Primary Venous Leg Ulcer Etiology: Wound Location: Left, Medial Lower Leg Wound Open Wounding Event: Blister Wounding Event: Blister Status: Date Acquired: 07/05/2022 Comorbid Cataracts, Asthma, Hypertension, Neuropathy, Received Weeks Of Treatment: 1 History: Chemotherapy, Received Radiation Clustered Wound: No Wound Measurements Length: (cm) 0.6 Width: (cm) 0.4 Depth: (cm) 0.1 Area: (cm) 0.188 Volume: (cm) 0.019 % Reduction in Area: 20.3% % Reduction in  Volume: 20.8% Wound Description Classification: Full Thickness Without Exposed Support Structures Wound Margin: Flat and Intact Exudate Amount: Medium Exudate Type: Serosanguineous Exudate Color: red, brown Foul Odor After Cleansing: No Slough/Fibrino Yes Wound Bed Granulation Amount: Small (1-33%) Exposed Structure Granulation Quality: Pink Fascia Exposed: No Necrotic Amount: Large (67-100%) Fat Layer (Subcutaneous Tissue) Exposed: Yes Necrotic Quality: Adherent Slough Tendon Exposed: No Muscle Exposed: No Joint Exposed: No Bone Exposed: No Periwound Skin Texture Texture Color No Abnormalities Noted: No No Abnormalities Noted: No Moisture No Abnormalities Noted: No Treatment Notes Wound #1 (Lower Leg) Wound Laterality: Left, Medial Cleanser Soap and Water Discharge Instruction: May shower and wash wound with dial antibacterial soap and water prior to  dressing change. Peri-Wound Care Sween Lotion (Moisturizing lotion) Discharge Instruction: Apply moisturizing lotion as directed Topical Triamcinolone Discharge Instruction: Apply Triamcinolone as directed Primary Dressing KerraCel Ag Gelling Fiber Dressing, 2x2 in (silver alginate) Discharge Instruction: Apply silver alginate to wound bed as instructed Secondary Dressing Zetuvit Plus Silicone Border Dressing 4x4 (in/in) Discharge Instruction: Apply silicone border over primary dressing as directed. Secured With [employer] Surgical T 2x10 (in/yd) ape Discharge Instruction: Secure with tape as directed. Compression Wrap ThreePress (3 layer compression wrap) Discharge Instruction: Apply three layer compression as directed. Compression Stockings Add-Ons Electronic Signature(s) Signed: 08/08/2022 4:51:18 PM By: Adline Peals Entered By: Adline Peals on 08/08/2022 09:09:13 -------------------------------------------------------------------------------- Wound Assessment Details Patient Name: Date of Service: [name], [name] 08/08/2022 8:30 A M Medical Record Number: [id number] Patient Account Number: [id number] Date of Birth/Sex: Treating RN: 05-04-1946 (76 y.o. [name] Primary Care Lillyona Polasek: [name] Other Clinician: Referring Kamareon Sciandra: Treating Sherlon Nied/Extender: Pixie Casino Weeks in Treatment: 1 Wound Status Wound Number: 2 Primary Venous Leg Ulcer Etiology: Wound Location: Left, Posterior Lower Leg Wound Open Wounding Event: Blister Status: Date Acquired: 07/06/2022 Comorbid Cataracts, Asthma, Hypertension, Neuropathy, Received Weeks Of Treatment: 1 History: Chemotherapy, Received Radiation Clustered Wound: No Wound Measurements Length: (cm) 3.2 Width: (cm) 2.4 Depth: (cm) 0.2 Area: (cm) 6.032 Volume: (cm) 1.206 % Reduction in Area: 12.2% % Reduction in Volume: -75.5% Epithelialization:  None Wound Description Classification: Full Thickness Without Exposed Support Structures Wound Margin: Distinct, outline attached Exudate Amount: Medium Exudate Type: Serosanguineous Exudate Color: red, brown Foul Odor After Cleansing: No Slough/Fibrino Yes Wound Bed Granulation Amount: Medium (34-66%) Exposed Structure Granulation Quality: Red Fascia Exposed: No Necrotic Amount: Medium (34-66%) Fat Layer (Subcutaneous Tissue) Exposed: Yes Necrotic Quality: Adherent Slough Tendon Exposed: No Muscle Exposed: No Joint Exposed: No Bone Exposed: No Periwound Skin Texture Texture Color No Abnormalities Noted: No No Abnormalities Noted: No Moisture No Abnormalities Noted: No Treatment Notes Wound #2 (Lower Leg) Wound Laterality: Left, Posterior Cleanser Soap and Water Discharge Instruction: May shower and wash wound with dial antibacterial soap and water prior to dressing change. Peri-Wound Care Sween Lotion (Moisturizing lotion) Discharge Instruction: Apply moisturizing lotion as directed Topical Triamcinolone Discharge Instruction: Apply Triamcinolone as directed Primary Dressing Santyl Ointment Discharge Instruction: Apply nickel thick amount to wound bed as instructed Secondary Dressing Zetuvit Plus Silicone Border Dressing 4x4 (in/in) Discharge Instruction: Apply silicone border over primary dressing as directed. Secured With [employer] Surgical T 2x10 (in/yd) ape Discharge Instruction: Secure with tape as directed. Compression Wrap ThreePress (3 layer compression wrap) Discharge Instruction: Apply three layer compression as directed. Compression Stockings Add-Ons Electronic Signature(s) Signed: 08/08/2022 4:51:18 PM By: Adline Peals Entered By: Adline Peals on 08/08/2022 09:09:17 -------------------------------------------------------------------------------- Vitals Details Patient Name: Date of Service: [name].  08/08/2022 8:30  A M Medical Record Number: [id number] Patient Account Number: [id number] Date of Birth/Sex: Treating RN: 1946-07-20 (76 y.o. [name] Primary Care Shatoria Stooksbury: [name] Other Clinician: Referring Donae Kueker: Treating Elric Tirado/Extender: Pixie Casino Weeks in Treatment: 1 Vital Signs Time Taken: 09:08 Temperature (F): 98.3 Height (in): 63 Pulse (bpm): 77 Weight (lbs): 200 Respiratory Rate (breaths/min): 18 Body Mass Index (BMI): 35.4 Blood Pressure (mmHg): 169/72 Reference Range: 80 - 120 mg / dl Electronic Signature(s) Signed: 08/08/2022 4:51:18 PM By: Adline Peals Entered By: Adline Peals on 08/08/2022 11:21:62

## 2022-08-08 NOTE — Progress Notes (Signed)
XAN, SPARKMAN ([id number]) Visit Report for 08/03/2022 Arrival Information Details Patient Name: Date of Service: [name], [name] 08/03/2022 1:45 PM Medical Record Number: [id number] Patient Account Number: [id number] Date of Birth/Sex: Treating RN: 1946-03-14 (76 y.o. [name], [name] Primary Care Brannan Cassedy: [name] Other Clinician: Referring Shaune Malacara: Treating Reyden Smith/Extender: [name] Session in Treatment: 1 Visit Information History Since Last Visit All ordered tests and consults were completed: Yes Patient Arrived: Ambulatory Added or deleted any medications: No Arrival Time: 14:06 Any new allergies or adverse reactions: No Accompanied By: self Had a fall or experienced change in No Transfer Assistance: None activities of daily living that may affect Patient Identification Verified: Yes risk of falls: Secondary Verification Process Completed: Yes Signs or symptoms of abuse/neglect since last visito No Patient Requires Transmission-Based Precautions: No Hospitalized since last visit: No Patient Has Alerts: No Implantable device outside of the clinic excluding No cellular tissue based products placed in the center since last visit: Has Dressing in Place as Prescribed: Yes Has Compression in Place as Prescribed: No Pain Present Now: No Electronic Signature(s) Signed: 08/03/2022 4:38:59 PM By: Blanche East RN Entered By: Blanche East on 08/03/2022 14:06:26 -------------------------------------------------------------------------------- Compression Therapy Details Patient Name: Date of Service: [name] 08/03/2022 1:45 PM Medical Record Number: [id number] Patient Account Number: [id number] Date of Birth/Sex: Treating RN: 01/01/1946 (76 y.o. [name] Primary Care Geroldine Esquivias: [name] Other Clinician: Referring Lillymae Duet: Treating Ibrahima Holberg/Extender: [name] Session in Treatment: 1 Compression  Therapy Performed for Wound Assessment: Wound #2 Left,Posterior Lower Leg Performed By: Clinician Blanche East, RN Compression Type: Three Layer Post Procedure Diagnosis Same as Pre-procedure Electronic Signature(s) Signed: 08/03/2022 4:38:59 PM By: Blanche East RN Entered By: Blanche East on 08/03/2022 14:22:47 -------------------------------------------------------------------------------- Lower Extremity Assessment Details Patient Name: Date of Service: [name] 08/03/2022 1:45 PM Medical Record Number: [id number] Patient Account Number: [id number] Date of Birth/Sex: Treating RN: December 24, 1945 (76 y.o. [name] Primary Care Osker Ayoub: [name] Other Clinician: Referring Durinda Buzzelli: Treating Lavere Shinsky/Extender: Pixie Casino Weeks in Treatment: 1 Edema Assessment Assessed: [Left: No] [Right: No] E[Left: dema] [Right: :] Calf Left: Right: Point of Measurement: From Medial Instep 46.5 cm Ankle Left: Right: Point of Measurement: From Medial Instep 24 cm Vascular Assessment Pulses: Dorsalis Pedis Palpable: [Left:Yes] Electronic Signature(s) Signed: 08/03/2022 4:38:59 PM By: Blanche East RN Entered By: Blanche East on 08/03/2022 14:07:09 -------------------------------------------------------------------------------- Multi Wound Chart Details Patient Name: Date of Service: [name] 08/03/2022 1:45 PM Medical Record Number: [id number] Patient Account Number: [id number] Date of Birth/Sex: Treating RN: [birth year] (76 y.o. [name] Primary Care Lakota Schweppe: [name] Other Clinician: Referring Trea Latner: Treating Tarini Carrier/Extender: Pixie Casino Weeks in Treatment: 1 Vital Signs Height(in): 63 Pulse(bpm): 41 Weight(lbs): 200 Blood Pressure(mmHg): 180/93 Body Mass Index(BMI): 35.4 Temperature(F): 98.1 Respiratory Rate(breaths/min): 18 Photos: [1:Left, Medial Lower Leg] [2:Left, Posterior  Lower Leg] [N/A:N/A N/A] Wound Location: [1:Blister] [2:Blister] [N/A:N/A] Wounding Event: [1:Venous Leg Ulcer] [2:Venous Leg Ulcer] [N/A:N/A] Primary Etiology: [1:Cataracts, Asthma, Hypertension,] [2:Cataracts, Asthma, Hypertension,] [N/A:N/A] Comorbid History: [1:Neuropathy, Received Chemotherapy, Neuropathy, Received Chemotherapy, Received Radiation 07/05/2022] [2:Received Radiation 07/06/2022] [N/A:N/A] Date Acquired: [1:1] [2:1] [N/A:N/A] Weeks of Treatment: [1:Open] [2:Open] [N/A:N/A] Wound Status: [1:No] [2:No] [N/A:N/A] Wound Recurrence: [1:0.6x0.4x0.1] [2:3.2x2.4x0.2] [N/A:N/A] Measurements L x W x D (cm) [1:0.188] [2:6.032] [N/A:N/A] A (cm) : rea [1:0.019] [2:1.206] [N/A:N/A] Volume (cm) : [1:20.30%] [2:12.20%] [N/A:N/A] % Reduction in A [1:rea: 20.80%] [2:-75.50%] [N/A:N/A] % Reduction in Volume: [1:Full Thickness Without Exposed] [2:Full Thickness Without Exposed] [  N/A:N/A] Classification: [1:Support Structures Medium] [2:Support Structures Medium] [N/A:N/A] Exudate A mount: [1:Serosanguineous] [2:Serosanguineous] [N/A:N/A] Exudate Type: [1:red, brown] [2:red, brown] [N/A:N/A] Exudate Color: [1:Flat and Intact] [2:Distinct, outline attached] [N/A:N/A] Wound Margin: [1:Small (1-33%)] [2:Medium (34-66%)] [N/A:N/A] Granulation A mount: [1:Pink] [2:Red] [N/A:N/A] Granulation Quality: [1:Large (67-100%)] [2:Medium (34-66%)] [N/A:N/A] Necrotic A mount: [1:Fat Layer (Subcutaneous Tissue): Yes Fat Layer (Subcutaneous Tissue): Yes N/A] Exposed Structures: [1:Fascia: No Tendon: No Muscle: No Joint: No Bone: No N/A] [2:Fascia: No Tendon: No Muscle: No Joint: No Bone: No None] [N/A:N/A] Epithelialization: [1:Debridement - Selective/Open Wound Debridement - Selective/Open Wound N/A] Debridement: Pre-procedure Verification/Time Out 14:19 [2:14:19] [N/A:N/A] Taken: [1:N/A] [2:Lidocaine 5% topical ointment] [N/A:N/A] Pain Control: [1:Necrotic/Eschar, Slough] [2:Slough] [N/A:N/A] Tissue  Debrided: [1:Non-Viable Tissue] [2:Non-Viable Tissue] [N/A:N/A] Level: [1:0.24] [2:7.68] [N/A:N/A] Debridement A (sq cm): [1:rea Curette] [2:Curette] [N/A:N/A] Instrument: [1:Minimum] [2:Minimum] [N/A:N/A] Bleeding: [1:Pressure] [2:Pressure] [N/A:N/A] Hemostasis A chieved: [1:0] [2:0] [N/A:N/A] Procedural Pain: [1:0] [2:0] [N/A:N/A] Post Procedural Pain: [1:Procedure was tolerated well] [2:Procedure was tolerated well] [N/A:N/A] Debridement Treatment Response: [1:0.6x0.4x0.1] [2:3.2x2.4x0.2] [N/A:N/A] Post Debridement Measurements L x W x D (cm) [1:0.019] [2:1.206] [N/A:N/A] Post Debridement Volume: (cm) [1:Debridement] [2:Compression Therapy] [N/A:N/A] Procedures Performed: [2:Debridement] Treatment Notes Electronic Signature(s) Signed: 08/03/2022 2:27:03 PM By: Fredirick Maudlin MD FACS Signed: 08/03/2022 4:38:59 PM By: Blanche East RN Entered By: Fredirick Maudlin on 08/03/2022 14:27:03 -------------------------------------------------------------------------------- Multi-Disciplinary Care Plan Details Patient Name: Date of Service: ELDENE, PLOCHER 08/03/2022 1:45 PM Medical Record Number: [id number] Patient Account Number: [id number] Date of Birth/Sex: Treating RN: 05/15/1946 (76 y.o. [name] Primary Care Ramell Wacha: [name] Other Clinician: Referring Gloriana Piltz: Treating Soo Steelman/Extender: Pixie Casino Weeks in Treatment: 1 Active Inactive Wound/Skin Impairment Nursing Diagnoses: Impaired tissue integrity Goals: Ulcer/skin breakdown will have a volume reduction of 30% by week 4 Date Initiated: 08/03/2022 Target Resolution Date: 08/31/2022 Goal Status: Active Interventions: Assess ulceration(s) every visit Treatment Activities: Skin care regimen initiated : 08/03/2022 Notes: Electronic Signature(s) Signed: 08/03/2022 4:38:59 PM By: Blanche East RN Entered By: Blanche East on 08/03/2022  14:13:57 -------------------------------------------------------------------------------- Pain Assessment Details Patient Name: Date of Service: ELEXA, KIVI 08/03/2022 1:45 PM Medical Record Number: [id number] Patient Account Number: [id number] Date of Birth/Sex: Treating RN: 01/25/1946 (76 y.o. [name] Primary Care Jacayla Nordell: [name] Other Clinician: Referring Chon Buhl: Treating Hisako Bugh/Extender: Pixie Casino Weeks in Treatment: 1 Active Problems Location of Pain Severity and Description of Pain Patient Has Paino No Site Locations Pain Management and Medication Current Pain Management: Electronic Signature(s) Signed: 08/03/2022 4:38:59 PM By: Blanche East RN Entered By: Blanche East on 08/03/2022 14:06:47 -------------------------------------------------------------------------------- Patient/Caregiver Education Details Patient Name: Date of Service: [name] 9/29/2023andnbsp1:45 PM Medical Record Number: [id number] Patient Account Number: [id number] Date of Birth/Gender: Treating RN: 1946-09-14 (76 y.o. [name] Primary Care Physician: [name] Other Clinician: Referring Physician: Treating Physician/Extender: [name] Session in Treatment: 1 Education Assessment Education Provided To: Patient Education Topics Provided Wound/Skin Impairment: Methods: Explain/Verbal Responses: Reinforcements needed, State content correctly Electronic Signature(s) Signed: 08/03/2022 4:38:59 PM By: Blanche East RN Entered By: Blanche East on 08/03/2022 14:14:17 -------------------------------------------------------------------------------- Wound Assessment Details Patient Name: Date of Service: ALANA, DAYTON 08/03/2022 1:45 PM Medical Record Number: [id number] Patient Account Number: [id number] Date of Birth/Sex: Treating RN: 09/07/1946 (76 y.o. [name] Primary Care Latrail Pounders:  [name] Other Clinician: Referring Rylee Nuzum: Treating Kareena Arrambide/Extender: Pixie Casino Weeks in Treatment: 1 Wound Status Wound Number: 1 Primary Venous Leg Ulcer Etiology: Wound Location: Left, Medial Lower Leg Wound Open Wounding  Event: Blister Status: Date Acquired: 07/05/2022 Comorbid Cataracts, Asthma, Hypertension, Neuropathy, Received Weeks Of Treatment: 1 History: Chemotherapy, Received Radiation Clustered Wound: No Photos Wound Measurements Length: (cm) 0.6 Width: (cm) 0.4 Depth: (cm) 0.1 Area: (cm) 0.188 Volume: (cm) 0.019 % Reduction in Area: 20.3% % Reduction in Volume: 20.8% Tunneling: No Undermining: No Wound Description Classification: Full Thickness Without Exposed Support Structures Wound Margin: Flat and Intact Exudate Amount: Medium Exudate Type: Serosanguineous Exudate Color: red, brown Foul Odor After Cleansing: No Slough/Fibrino Yes Wound Bed Granulation Amount: Small (1-33%) Exposed Structure Granulation Quality: Pink Fascia Exposed: No Necrotic Amount: Large (67-100%) Fat Layer (Subcutaneous Tissue) Exposed: Yes Necrotic Quality: Adherent Slough Tendon Exposed: No Muscle Exposed: No Joint Exposed: No Bone Exposed: No Electronic Signature(s) Signed: 08/03/2022 4:38:59 PM By: Blanche East RN Signed: 08/08/2022 4:18:18 PM By: Sandre Kitty Entered By: Sandre Kitty on 08/03/2022 14:14:59 -------------------------------------------------------------------------------- Wound Assessment Details Patient Name: Date of Service: ISEBELLA, UPSHUR 08/03/2022 1:45 PM Medical Record Number: [id number] Patient Account Number: [id number] Date of Birth/Sex: Treating RN: 03-02-1946 (76 y.o. [name], [name] Primary Care Bernhard Koskinen: [name] Other Clinician: Referring Zidane [name]: Treating Adalie Mand/Extender: Pixie Casino Weeks in Treatment: 1 Wound Status Wound Number: 2 Primary Venous  Leg Ulcer Etiology: Wound Location: Left, Posterior Lower Leg Wound Open Wounding Event: Blister Status: Date Acquired: 07/06/2022 Comorbid Cataracts, Asthma, Hypertension, Neuropathy, Received Weeks Of Treatment: 1 History: Chemotherapy, Received Radiation Clustered Wound: No Photos Wound Measurements Length: (cm) 3.2 Width: (cm) 2.4 Depth: (cm) 0.2 Area: (cm) 6.032 Volume: (cm) 1.206 % Reduction in Area: 12.2% % Reduction in Volume: -75.5% Epithelialization: None Tunneling: No Undermining: No Wound Description Classification: Full Thickness Without Exposed Support Structures Wound Margin: Distinct, outline attached Exudate Amount: Medium Exudate Type: Serosanguineous Exudate Color: red, brown Foul Odor After Cleansing: No Slough/Fibrino Yes Wound Bed Granulation Amount: Medium (34-66%) Exposed Structure Granulation Quality: Red Fascia Exposed: No Necrotic Amount: Medium (34-66%) Fat Layer (Subcutaneous Tissue) Exposed: Yes Necrotic Quality: Adherent Slough Tendon Exposed: No Muscle Exposed: No Joint Exposed: No Bone Exposed: No Electronic Signature(s) Signed: 08/03/2022 4:38:59 PM By: Blanche East RN Signed: 08/08/2022 4:18:18 PM By: Sandre Kitty Entered By: Sandre Kitty on 08/03/2022 14:15:14 -------------------------------------------------------------------------------- Avant Details Patient Name: Date of Service: [name]. 08/03/2022 1:45 PM Medical Record Number: [id number] Patient Account Number: [id number] Date of Birth/Sex: Treating RN: 1946/03/17 (76 y.o. [name], [name] Primary Care Sarye Kath: [name] Other Clinician: Referring Arturo Freundlich: Treating Lizzy Hamre/Extender: [name] Session in Treatment: 1 Vital Signs Time Taken: 14:05 Temperature (F): 98.1 Height (in): 63 Pulse (bpm): 77 Weight (lbs): 200 Respiratory Rate (breaths/min): 18 Body Mass Index (BMI): 35.4 Blood Pressure (mmHg):  180/93 Reference Range: 80 - 120 mg / dl Electronic Signature(s) Signed: 08/03/2022 4:38:59 PM By: Blanche East RN Entered By: Blanche East on 08/03/2022 14:06:42

## 2022-08-08 NOTE — Patient Outreach (Signed)
  Care Coordination   08/08/2022 Name: [name] MRN: [id number] DOB: [birth year]   Care Coordination Outreach Attempts:  An unsuccessful telephone outreach was attempted today to offer the patient information about available care coordination services as a benefit of their health plan.   Follow Up Plan:  Additional outreach attempts will be made to offer the patient care coordination information and services.   Encounter Outcome:  No Answer  Care Coordination Interventions Activated:  Yes   Care Coordination Interventions:  No, not indicated    [facility] [phone]

## 2022-08-08 NOTE — Progress Notes (Signed)
[name], [name] ([id number]) Visit Report for 08/08/2022 SuperBill Details Patient Name: Date of Service: [name], [name] 08/08/2022 Medical Record Number: [id number] Patient Account Number: [id number] Date of Birth/Sex: Treating RN: November 27, 1945 (76 y.o. [name] Primary Care Provider: [name] Other Clinician: Referring Provider: Treating Provider/Extender: Pixie Casino Weeks in Treatment: 1 Diagnosis Coding ICD-10 Codes Code Description [phone] Non-pressure chronic ulcer of other part of left lower leg with fat layer exposed I83.893 Varicose veins of bilateral lower extremities with other complications A07.6 Venous insufficiency (chronic) (peripheral) G62.9 Polyneuropathy, unspecified R60.0 Localized edema Facility Procedures CPT4 Code Description Modifier Quantity [phone] (Facility Use Only) [phone] - [facility] LWR LT LEG 1 ICD-10 Diagnosis Description L97.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d Electronic Signature(s) Signed: 08/08/2022 11:36:38 AM By: Fredirick Maudlin MD FACS Signed: 08/08/2022 4:51:18 PM By: Adline Peals Entered By: Adline Peals on 08/08/2022 09:10:09

## 2022-08-13 ENCOUNTER — Encounter (HOSPITAL_BASED_OUTPATIENT_CLINIC_OR_DEPARTMENT_OTHER): Payer: Medicare Other | Admitting: General Surgery

## 2022-08-13 DIAGNOSIS — I872 Venous insufficiency (chronic) (peripheral): Secondary | ICD-10-CM | POA: Diagnosis not present

## 2022-08-13 DIAGNOSIS — G629 Polyneuropathy, unspecified: Secondary | ICD-10-CM | POA: Diagnosis not present

## 2022-08-13 DIAGNOSIS — R600 Localized edema: Secondary | ICD-10-CM | POA: Diagnosis not present

## 2022-08-13 DIAGNOSIS — Z87891 Personal history of nicotine dependence: Secondary | ICD-10-CM | POA: Diagnosis not present

## 2022-08-13 DIAGNOSIS — L97222 Non-pressure chronic ulcer of left calf with fat layer exposed: Secondary | ICD-10-CM | POA: Diagnosis not present

## 2022-08-13 DIAGNOSIS — L97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d: Secondary | ICD-10-CM | POA: Diagnosis not present

## 2022-08-13 DIAGNOSIS — I83893 Varicose veins of bilateral lower extremities with other complications: Secondary | ICD-10-CM | POA: Diagnosis not present

## 2022-08-15 ENCOUNTER — Other Ambulatory Visit: Payer: Self-pay

## 2022-08-15 ENCOUNTER — Inpatient Hospital Stay: Payer: Medicare Other | Admitting: Hematology & Oncology

## 2022-08-15 ENCOUNTER — Other Ambulatory Visit: Payer: Self-pay | Admitting: *Deleted

## 2022-08-15 ENCOUNTER — Inpatient Hospital Stay: Payer: Medicare Other

## 2022-08-15 ENCOUNTER — Encounter: Payer: Self-pay | Admitting: Hematology & Oncology

## 2022-08-15 ENCOUNTER — Inpatient Hospital Stay: Payer: Medicare Other | Attending: Hematology & Oncology | Admitting: Hematology & Oncology

## 2022-08-15 VITALS — BP 150/66 | HR 78 | Temp 97.8°F | Resp 18 | Wt 198.0 lb

## 2022-08-15 DIAGNOSIS — Z79899 Other long term (current) drug therapy: Secondary | ICD-10-CM | POA: Diagnosis not present

## 2022-08-15 DIAGNOSIS — R197 Diarrhea, unspecified: Secondary | ICD-10-CM | POA: Insufficient documentation

## 2022-08-15 DIAGNOSIS — G8929 Other chronic pain: Secondary | ICD-10-CM | POA: Diagnosis not present

## 2022-08-15 DIAGNOSIS — C4361 Malignant melanoma of right upper limb, including shoulder: Secondary | ICD-10-CM

## 2022-08-15 DIAGNOSIS — C439 Malignant melanoma of skin, unspecified: Secondary | ICD-10-CM

## 2022-08-15 DIAGNOSIS — L03116 Cellulitis of left lower limb: Secondary | ICD-10-CM | POA: Diagnosis not present

## 2022-08-15 DIAGNOSIS — F0390 Unspecified dementia without behavioral disturbance: Secondary | ICD-10-CM | POA: Insufficient documentation

## 2022-08-15 LAB — CBC WITH DIFFERENTIAL (CANCER CENTER ONLY)
Abs Immature Granulocytes: 0.02 10*3/uL (ref 0.00–0.07)
Basophils Absolute: 0 10*3/uL (ref 0.0–0.1)
Basophils Relative: 0 %
Eosinophils Absolute: 0.2 10*3/uL (ref 0.0–0.5)
Eosinophils Relative: 4 %
HCT: 40.1 % (ref 36.0–46.0)
Hemoglobin: 13.1 g/dL (ref 12.0–15.0)
Immature Granulocytes: 0 %
Lymphocytes Relative: 27 %
Lymphs Abs: 1.8 10*3/uL (ref 0.7–4.0)
MCH: 30.5 pg (ref 26.0–34.0)
MCHC: 32.7 g/dL (ref 30.0–36.0)
MCV: 93.5 fL (ref 80.0–100.0)
Monocytes Absolute: 0.5 10*3/uL (ref 0.1–1.0)
Monocytes Relative: 7 %
Neutro Abs: 4 10*3/uL (ref 1.7–7.7)
Neutrophils Relative %: 62 %
Platelet Count: 200 10*3/uL (ref 150–400)
RBC: 4.29 MIL/uL (ref 3.87–5.11)
RDW: 12.7 % (ref 11.5–15.5)
WBC Count: 6.5 10*3/uL (ref 4.0–10.5)
nRBC: 0 % (ref 0.0–0.2)

## 2022-08-15 LAB — CMP (CANCER CENTER ONLY)
ALT: 14 U/L (ref 0–44)
AST: 12 U/L — ABNORMAL LOW (ref 15–41)
Albumin: 4 g/dL (ref 3.5–5.0)
Alkaline Phosphatase: 49 U/L (ref 38–126)
Anion gap: 7 (ref 5–15)
BUN: 19 mg/dL (ref 8–23)
CO2: 29 mmol/L (ref 22–32)
Calcium: 9.9 mg/dL (ref 8.9–10.3)
Chloride: 107 mmol/L (ref 98–111)
Creatinine: 0.8 mg/dL (ref 0.44–1.00)
GFR, Estimated: >60 mL/min (ref >60)
Glucose, Bld: 112 mg/dL — ABNORMAL HIGH (ref 70–99)
Potassium: 3.7 mmol/L (ref 3.5–5.1)
Sodium: 143 mmol/L (ref 135–145)
Total Bilirubin: 0.5 mg/dL (ref 0.3–1.2)
Total Protein: 7.4 g/dL (ref 6.5–8.1)

## 2022-08-15 LAB — LACTATE DEHYDROGENASE: LDH: 202 U/L — ABNORMAL HIGH (ref 98–192)

## 2022-08-15 NOTE — Progress Notes (Signed)
Hematology and Oncology Follow Up Visit  [name] [id number] 02-16-1946 76 y.o. 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  We last saw her a year ago.  The problem that she has is that she has medication induced dementia.  She thinks is by previous steroids and opioids.  I know she is being followed closely by her family doctor.  She is also having some issues with diarrhea.  She has had diarrhea for about 2 weeks or so.  I told her to try some Kaopectate.  She also has a little bit of think cellulitis over on the left lower leg.  She certainly is try to manage as well as she can.  She I think is living with 2 other people right now.  She does have the chronic pain issues.  Thankfully, there is been no evidence of any problems with myeloma.  Currently, I would have said that her performance status is probably ECOG 2.    Thank you Medications:  Allergies as of 08/15/2022       Reactions   Sulfa Antibiotics Itching, Swelling, Shortness Of Breath   Facial swelling   Voltaren [diclofenac Sodium] Shortness Of Breath   Latex Other (See Comments)   Blisters ONLY HAD REACTION TO TAPE  (??? ONLY ADHESIVE ALLERGY)   Tape Other (See Comments)   Caused blisters - must use paper tape - same reaction from NeoPrene   Doxycycline Other (See Comments)   Diaphoresis and dizziness   Other    Neoprene        Medication List        Accurate as of August 15, 2022  2:21 PM. If you have any questions, ask your nurse or doctor.          acetaminophen 650 MG CR tablet Commonly known as: TYLENOL Take 2 tablets (1,300 mg total) by mouth 2 (two) times daily as needed for pain.   albuterol 108 (90 Base) MCG/ACT inhaler Commonly known as: VENTOLIN HFA INHALE 2 PUFFS BY MOUTH EVERY 6 HOURS AS NEEDED FOR WHEEZING AND SHORTNESS OF  BREATH   aspirin EC 81 MG tablet Take 81 mg by mouth at bedtime. Melanoma prevention   furosemide 40 MG tablet Commonly known as: LASIX TAKE 1 TABLET (40 MG TOTAL) BY MOUTH DAILY. TAKE SECOND DOSE IF NEEDED FOR LEG SWELLING/WEIGHT GAIN   Klor-Con M20 20 MEQ tablet Generic drug: potassium chloride SA TAKE 1 TABLET BY MOUTH TWICE A DAY   loratadine 10 MG tablet Commonly known as: CLARITIN Take 10 mg by mouth daily.   losartan 50 MG tablet Commonly known as: COZAAR TAKE 1 TABLET BY MOUTH EVERY DAY   montelukast 10 MG tablet Commonly known as: SINGULAIR TAKE 1 TABLET BY MOUTH EVERY DAY   multivitamin with minerals Tabs tablet Take 1 tablet by mouth daily.   PRESCRIPTION MEDICATION CPAP   PROBIOTIC DAILY PO Take 1 tablet by mouth daily.   TEARS PLUS OP Place 1 drop into both eyes daily as needed (dry eyes/ redness/ burning).   triamcinolone 55 MCG/ACT Aero nasal inhaler Commonly known as: NASACORT Place 2 sprays into the nose daily. In each nostril   Vitamin D (Ergocalciferol) 1.25 MG (50000 UNIT) Caps capsule Commonly known as: DRISDOL TAKE 1 CAPSULE BY MOUTH ONCE A WEEK  Allergies:  Allergies  Allergen Reactions   Sulfa Antibiotics Itching, Swelling and Shortness Of Breath    Facial swelling   Voltaren [Diclofenac Sodium] Shortness Of Breath   Latex Other (See Comments)    Blisters ONLY HAD REACTION TO TAPE  (??? ONLY ADHESIVE ALLERGY)   Tape Other (See Comments)    Caused blisters - must use paper tape - same reaction from NeoPrene   Doxycycline Other (See Comments)    Diaphoresis and dizziness   Other     Neoprene    Past Medical History, Surgical history, Social history, and Family History were reviewed and updated.  Review of Systems: All other 10 point review of systems is negative.   Physical Exam:  weight is 198 lb (89.8 kg). Her oral temperature is 97.8 F (36.6 C). Her blood pressure is 150/66 (abnormal) and her pulse is 78. Her  respiration is 18 and oxygen saturation is 100%.   Wt Readings from Last 3 Encounters:  08/15/22 198 lb (89.8 kg)  07/24/22 201 lb 2 oz (91.2 kg)  07/18/22 202 lb 8 oz (91.9 kg)    Ocular: Sclerae unicteric, pupils equal, round and reactive to light Ear-nose-throat: Oropharynx clear, dentition fair Lymphatic: No cervical, supraclavicular or axillary adenopathy Lungs no rales or rhonchi, good excursion bilaterally Heart regular rate and rhythm, no murmur appreciated Abd soft, nontender, positive bowel sounds MSK no focal spinal tenderness, no joint edema Neuro: non-focal, well-oriented, appropriate affect Breasts: Deferred   Lab Results  Component Value Date   WBC 6.5 08/15/2022   HGB 13.1 08/15/2022   HCT 40.1 08/15/2022   MCV 93.5 08/15/2022   PLT 200 08/15/2022   Lab Results  Component Value Date   FERRITIN 51.2 10/12/2020   IRON 44 09/27/2021   TIBC 245.0 (L) 09/27/2021   UIBC 207 08/21/2013   IRONPCTSAT 18.0 (L) 09/27/2021   Lab Results  Component Value Date   RBC 4.29 08/15/2022   No results found for: "KPAFRELGTCHN", "LAMBDASER", "KAPLAMBRATIO" No results found for: "IGGSERUM", "IGA", "IGMSERUM" No results found for: "TOTALPROTELP", "ALBUMINELP", "A1GS", "A2GS", "BETS", "BETA2SER", "GAMS", "MSPIKE", "SPEI"   Chemistry      Component Value Date/Time   NA 143 08/15/2022 1208   NA 149 (H) 08/15/2017 1039   NA 143 09/17/2016 1018   K 3.7 08/15/2022 1208   K 4.1 08/15/2017 1039   K 3.5 09/17/2016 1018   CL 107 08/15/2022 1208   CL 107 08/15/2017 1039   CO2 29 08/15/2022 1208   CO2 29 08/15/2017 1039   CO2 28 09/17/2016 1018   BUN 19 08/15/2022 1208   BUN 14 08/15/2017 1039   BUN 19.5 09/17/2016 1018   CREATININE 0.80 08/15/2022 1208   CREATININE 0.79 04/20/2022 1526   CREATININE 0.8 09/17/2016 1018      Component Value Date/Time   CALCIUM 9.9 08/15/2022 1208   CALCIUM 9.7 08/15/2017 1039   CALCIUM 9.8 09/17/2016 1018   ALKPHOS 49 08/15/2022 1208    ALKPHOS 48 08/15/2017 1039   ALKPHOS 71 09/17/2016 1018   AST 12 (L) 08/15/2022 1208   AST 10 09/17/2016 1018   ALT 14 08/15/2022 1208   ALT 43 08/15/2017 1039   ALT 18 09/17/2016 1018   BILITOT 0.5 08/15/2022 1208   BILITOT 0.39 09/17/2016 1018       Impression and Plan: [name] is a very pleasant 76 yo caucasian female with history of stage IIIc melanoma if the right shoulder with resection. She had 8 positive lymph  nodes and completed adjuvant interferon therapy in December 2012  followed by radiation to the right axilla.   Hopefully, she can have this diarrhea taking care of.  Also, I hope that this dementia that she has will improve somewhat.  We will get her back in another year.  I am just happy that there is no problems with melanoma recurrence.    [name], MD 10/11/20232:21 PM

## 2022-08-17 NOTE — Progress Notes (Signed)
[name], [name] ([id number]) [id number] Page 1 of 9 Visit Report for 08/13/2022 Chief Complaint Document Details Patient Name: Date of Service: [name] 08/13/2022 1:45 PM Medical Record Number: [id number] Patient Account Number: [id number] Date of Birth/Sex: Treating RN: 02-Oct-1946 (76 y.o. F) Primary Care Provider: [name] Other Clinician: Referring Provider: Treating Provider/Extender: [name] Session in Treatment: 2 Information Obtained from: Patient Chief Complaint Patient presents for treatment of an open ulcer due to venous insufficiency Electronic Signature(s) Signed: 08/13/2022 2:26:42 PM By: Fredirick Maudlin MD FACS Entered By: Fredirick Maudlin on 08/13/2022 14:26:42 -------------------------------------------------------------------------------- Debridement Details Patient Name: Date of Service: [name] 08/13/2022 1:45 PM Medical Record Number: [id number] Patient Account Number: [id number] Date of Birth/Sex: Treating RN: 1946/08/29 (76 y.o. [name], [name] Primary Care Provider: [name] Other Clinician: Referring Provider: Treating Provider/Extender: [name] Session in Treatment: 2 Debridement Performed for Assessment: Wound #2 Left,Posterior Lower Leg Performed By: Physician Fredirick Maudlin, MD Debridement Type: Debridement Severity of Tissue Pre Debridement: Fat layer exposed Level of Consciousness (Pre-procedure): Awake and Alert Pre-procedure Verification/Time Out Yes - 14:22 Taken: Start Time: 14:23 Pain Control: Lidocaine 5% topical ointment T Area Debrided (L x W): otal 3.6 (cm) x 2.6 (cm) = 9.36 (cm) Tissue and other material debrided: Non-Viable, Slough, Slough Level: Non-Viable Tissue Debridement Description: Selective/Open Wound Instrument: Curette Bleeding: Minimum Hemostasis Achieved: Pressure Procedural Pain: 0 Post Procedural Pain:  0 Response to Treatment: Procedure was tolerated well Level of Consciousness (Post- Awake and Alert procedure): Post Debridement Measurements of Total Wound Length: (cm) 3.6 Width: (cm) 2.6 Depth: (cm) 0.1 Volume: (cm) 0.735 Character of Wound/Ulcer Post Debridement: Requires Further Debridement Severity of Tissue Post Debridement: Fat layer exposed MERON, BOCCHINO ([id number]) [id number] Page 2 of 9 Post Procedure Diagnosis Same as Pre-procedure Notes Scribed for Dr. Celine Ahr by Blanche East, RN Electronic Signature(s) Signed: 08/13/2022 2:31:37 PM By: Fredirick Maudlin MD FACS Signed: 08/17/2022 5:15:19 PM By: Blanche East RN Entered By: Blanche East on 08/13/2022 14:24:23 -------------------------------------------------------------------------------- HPI Details Patient Name: Date of Service: [name] 08/13/2022 1:45 PM Medical Record Number: [id number] Patient Account Number: [id number] Date of Birth/Sex: Treating RN: 1945-12-16 (76 y.o. F) Primary Care Provider: [name] Other Clinician: Referring Provider: Treating Provider/Extender: [name] Session in Treatment: 2 History of Present Illness HPI Description: ADMISSION This is a 76 year old woman with a history of chronic venous insufficiency status post saphenous vein ablations in 2010 and 2016. 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 She presents today with an open venous ulcer on her left lower leg. It has been present for a little over 2 weeks. She saw her primary care provider who apparently swabbed the wound and grew out Pseudomonas. She just completed a course of ciprofloxacin for this. ABI was 0.91. She reports that she has had previous issues with ulcers in this same location, stemming back to a punch biopsy taken by a dermatologist many years ago. She has had several skin substitutes applied to the area that have  ultimately resulted in healing on prior occasions. She has 2 small ulcers on her left medial lower leg. There is slough accumulation in both of them. The more anterior of the 2 is quite small and has some soft slough on the surface, underneath which there is good granulation tissue. The more medial wound also has slough accumulation, but the underlying surface is fairly fibrotic and gritty. This is consistent with her provided history of multiple  ulcers in the same location. 08/03/2022: The anterior wound is smaller today with just a little bit of slough accumulation. The more medial wound continues to be very sensitive and fibrotic with slough buildup. 08/13/2022: The anterior wound is closed. The more medial wound remains sensitive with a fairly fibrotic surface. There is more granulation tissue filling in, however, and there is less slough than on prior occasions. Electronic Signature(s) Signed: 08/13/2022 2:27:38 PM By: Fredirick Maudlin MD FACS Entered By: Fredirick Maudlin on 08/13/2022 14:27:38 -------------------------------------------------------------------------------- Physical Exam Details Patient Name: Date of Service: [name] 08/13/2022 1:45 PM Medical Record Number: [id number] Patient Account Number: [id number] Date of Birth/Sex: Treating RN: August 05, 1946 (76 y.o. F) Primary Care Provider: [name] Other Clinician: Referring Provider: Treating Provider/Extender: Pixie Casino Weeks in Treatment: 2 Constitutional Hypertensive, asymptomatic. . . . No acute distress.Marland Kitchen Respiratory Normal work of breathing on room air.Marland Kitchen [name] ([id number]) [id number] Page 3 of 9 Notes 08/13/2022: The anterior wound is closed. The more medial wound remains sensitive with a fairly fibrotic surface. There is more granulation tissue filling in, however, and there is less slough than on prior occasions. Electronic  Signature(s) Signed: 08/13/2022 2:28:08 PM By: Fredirick Maudlin MD FACS Entered By: Fredirick Maudlin on 08/13/2022 14:28:08 -------------------------------------------------------------------------------- Physician Orders Details Patient Name: Date of Service: [name] 08/13/2022 1:45 PM Medical Record Number: [id number] Patient Account Number: [id number] Date of Birth/Sex: Treating RN: 1946/09/16 (76 y.o. [name], [name] Primary Care Provider: [name] Other Clinician: Referring Provider: Treating Provider/Extender: [name] Session in Treatment: 2 Verbal / Phone Orders: No Diagnosis Coding ICD-10 Coding Code Description [phone] Non-pressure chronic ulcer of other part of left lower leg with fat layer exposed I83.893 Varicose veins of bilateral lower extremities with other complications E72.0 Venous insufficiency (chronic) (peripheral) G62.9 Polyneuropathy, unspecified R60.0 Localized edema Follow-up Appointments ppointment in 1 week. - Dr. Celine Ahr RM 4 Return A Anesthetic Wound #1 Left,Medial Lower Leg (In clinic) Topical Lidocaine 5% applied to wound bed - in clinic, prior to debridement Wound #2 Left,Posterior Lower Leg (In clinic) Topical Lidocaine 5% applied to wound bed - in clinic, prior to debridement Bathing/ Shower/ Hygiene May shower with protection but do not get wound dressing(s) wet. Edema Control - Lymphedema / SCD / Other Left Lower Extremity Avoid standing for long periods of time. Patient to wear own compression stockings every day. - Right lower extremity Moisturize legs daily. Wound Treatment Wound #1 - Lower Leg Wound Laterality: Left, Medial Cleanser: Soap and Water Discharge Instructions: May shower and wash wound with dial antibacterial soap and water prior to dressing change. Peri-Wound Care: Sween Lotion (Moisturizing lotion) Discharge Instructions: Apply moisturizing lotion as directed Topical:  Triamcinolone Discharge Instructions: Apply Triamcinolone as directed Prim Dressing: KerraCel Ag Gelling Fiber Dressing, 2x2 in (silver alginate) ary Discharge Instructions: Apply silver alginate to wound bed as instructed Secondary Dressing: Zetuvit Plus Silicone Border Dressing 4x4 (in/in) Discharge Instructions: Apply silicone border over primary dressing as directed. Secured With: 68M Medipore Public affairs consultant Surgical T 2x10 (in/yd) ape Discharge Instructions: Secure with tape as directed. MYSTI, HALEY ([id number]) [id number] Page 4 of 9 Compression Wrap: ThreePress (3 layer compression wrap) Discharge Instructions: Apply three layer compression as directed. Wound #2 - Lower Leg Wound Laterality: Left, Posterior Cleanser: Soap and Water Discharge Instructions: May shower and wash wound with dial antibacterial soap and water prior to dressing change. Peri-Wound Care: Sween Lotion (Moisturizing lotion) Discharge Instructions: Apply moisturizing lotion as directed Topical: Triamcinolone  Discharge Instructions: Apply Triamcinolone as directed Prim Dressing: Santyl Ointment ary Discharge Instructions: Apply nickel thick amount to wound bed as instructed Secondary Dressing: Zetuvit Plus Silicone Border Dressing 4x4 (in/in) Discharge Instructions: Apply silicone border over primary dressing as directed. Secured With: 79M Medipore Public affairs consultant Surgical T 2x10 (in/yd) ape Discharge Instructions: Secure with tape as directed. Compression Wrap: ThreePress (3 layer compression wrap) Discharge Instructions: Apply three layer compression as directed. Electronic Signature(s) Signed: 08/13/2022 2:31:37 PM By: Fredirick Maudlin MD FACS Entered By: Fredirick Maudlin on 08/13/2022 14:28:37 -------------------------------------------------------------------------------- Problem List Details Patient Name: Date of Service: JAMEELA, MICHNA 08/13/2022 1:45 PM Medical Record  Number: [id number] Patient Account Number: [id number] Date of Birth/Sex: Treating RN: 1946-06-24 (76 y.o. 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 07/27/2022 No Yes I83.893 Varicose veins of bilateral lower extremities with other complications 4/81/8563 No Yes I87.2 Venous insufficiency (chronic) (peripheral) 07/27/2022 No Yes G62.9 Polyneuropathy, unspecified 07/27/2022 No Yes R60.0 Localized edema 07/27/2022 No Yes Inactive Problems KADINCE, BOXLEY ([id number]) [id number] Page 5 of 9 Resolved Problems Electronic Signature(s) Signed: 08/13/2022 2:26:32 PM By: Fredirick Maudlin MD FACS Entered By: Fredirick Maudlin on 08/13/2022 14:26:32 -------------------------------------------------------------------------------- Progress Note Details Patient Name: Date of Service: MICKALA, LATON 08/13/2022 1:45 PM Medical Record Number: [id number] Patient Account Number: [id number] Date of Birth/Sex: Treating RN: 07/23/1946 (76 y.o. F) Primary Care Provider: [name] Other Clinician: Referring Provider: Treating Provider/Extender: [name] Session in Treatment: 2 Subjective Chief Complaint Information obtained from Patient Patient presents for treatment of an open ulcer due to venous insufficiency History of Present Illness (HPI) ADMISSION This is a 76 year old woman with a history of chronic venous insufficiency status post saphenous vein ablations in 2010 and 2016. 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 She presents today with an open venous ulcer on her left lower leg. It has been present for a little over 2 weeks. She  saw her primary care provider who apparently swabbed the wound and grew out Pseudomonas. She just completed a course of ciprofloxacin for this. ABI was 0.91. She reports that she has had previous issues with ulcers in this same location, stemming back to a punch biopsy taken by a dermatologist many years ago. She has had several skin substitutes applied to the area that have ultimately resulted in healing on prior occasions. She has 2 small ulcers on her left medial lower leg. There is slough accumulation in both of them. The more anterior of the 2 is quite small and has some soft slough on the surface, underneath which there is good granulation tissue. The more medial wound also has slough accumulation, but the underlying surface is fairly fibrotic and gritty. This is consistent with her provided history of multiple ulcers in the same location. 08/03/2022: The anterior wound is smaller today with just a little bit of slough accumulation. The more medial wound continues to be very sensitive and fibrotic with slough buildup. 08/13/2022: The anterior wound is closed. The more medial wound remains sensitive with a fairly fibrotic surface. There is more granulation tissue filling in, however, and there is less slough than on prior occasions. Patient History Information obtained from Patient. Family History Cancer - Mother,Paternal Grandparents, Diabetes - Siblings, Heart Disease - Father, Hypertension - Father.  Social History Former smoker - quit in 1967. Medical History Eyes Patient has history of Cataracts - L eye Ear/Nose/Mouth/Throat Denies history of Chronic sinus problems/congestion, Middle ear problems Hematologic/Lymphatic Denies history of Anemia, Human Immunodeficiency Virus, Lymphedema Respiratory Patient has history of Asthma Cardiovascular Patient has history of Hypertension Endocrine Denies history of Type I Diabetes, Type II Diabetes Neurologic Patient has history of Neuropathy  - Feet and finger Denies history of Seizure Disorder Oncologic Patient has history of Received Chemotherapy - 2012, Received Radiation - 2012 Hospitalization/Surgery History - Lower extremity venography 2020. - Intravascular ultrasound. - peripheral vascular intervention. - melanoma 2011. Medical A Surgical History Notes nd Respiratory OSA on CPAP Cardiovascular Cronic venous insufficiency 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 GAILA, ENGEBRETSEN ([id number]) [id number] Page 6 of 9 Gastrointestinal liver cyst Musculoskeletal arthritis Neurologic restless leg syndrome Objective Constitutional Hypertensive, asymptomatic. No acute distress.. Vitals Time Taken: 2:07 PM, Height: 63 in, Weight: 200 lbs, BMI: 35.4, Temperature: 98.5 F, Pulse: 76 bpm, Respiratory Rate: 18 breaths/min, Blood Pressure: 171/93 mmHg. Respiratory Normal work of breathing on room air.. General Notes: 08/13/2022: The anterior wound is closed. The more medial wound remains sensitive with a fairly fibrotic surface. There is more granulation tissue filling in, however, and there is less slough than on prior occasions. Integumentary (Hair, Skin) Wound #1 status is Open. Original cause of wound was Blister. The date acquired was: 07/05/2022. The wound has been in treatment 2 weeks. The wound is located on the Left,Medial Lower Leg. The wound measures 0.1cm length x 0.1cm width x 0.1cm depth; 0.008cm^2 area and 0.001cm^3 volume. There is Fat Layer (Subcutaneous Tissue) exposed. There is no tunneling or undermining noted. There is a medium amount of serosanguineous drainage noted. The wound margin is flat and intact. There is small (1-33%) pink granulation within the wound bed. There is a large (67-100%) amount of necrotic tissue within the wound bed including Adherent Slough. Wound #2 status is Open. Original cause of wound was Blister. The date acquired was: 07/06/2022. The  wound has been in treatment 2 weeks. The wound is located on the Left,Posterior Lower Leg. The wound measures 3.6cm length x 2.6cm width x 0.1cm depth; 7.351cm^2 area and 0.735cm^3 volume. There is Fat Layer (Subcutaneous Tissue) exposed. There is no tunneling or undermining noted. There is a medium amount of serosanguineous drainage noted. The wound margin is distinct with the outline attached to the wound base. There is medium (34-66%) red granulation within the wound bed. There is a medium (34-66%) amount of necrotic tissue within the wound bed including Adherent Slough. Assessment Active Problems ICD-10 Non-pressure chronic ulcer of other part of left lower leg with fat layer exposed Varicose veins of bilateral lower extremities with other complications Venous insufficiency (chronic) (peripheral) Polyneuropathy, unspecified Localized edema Procedures Wound #2 Pre-procedure diagnosis of Wound #2 is a Venous Leg Ulcer located on the Left,Posterior Lower Leg .Severity of Tissue Pre Debridement is: Fat layer exposed. There was a Selective/Open Wound Non-Viable Tissue Debridement with a total area of 9.36 sq cm performed by Fredirick Maudlin, MD. With the following instrument(s): Curette to remove Non-Viable tissue/material. Material removed includes [facility] after achieving pain control using Lidocaine 5% topical ointment. No specimens were taken. A time out was conducted at 14:22, prior to the start of the procedure. A Minimum amount of bleeding was controlled with Pressure. The procedure was tolerated well with a pain level of 0 throughout and a pain level of 0 following the procedure. Post Debridement  Measurements: 3.6cm length x 2.6cm width x 0.1cm depth; 0.735cm^3 volume. Character of Wound/Ulcer Post Debridement requires further debridement. Severity of Tissue Post Debridement is: Fat layer exposed. Post procedure Diagnosis Wound #2: Same as Pre-Procedure General Notes: Scribed for Dr.  Celine Ahr by Blanche East, RN. Pre-procedure diagnosis of Wound #2 is a Venous Leg Ulcer located on the Left,Posterior Lower Leg . There was a Three Layer Compression Therapy Procedure by Blanche East, RN. Post procedure Diagnosis Wound #2: Same as Pre-Procedure Plan JOZEE, HAMMER ([id number]) [id number] Page 7 of 9 Follow-up Appointments: Return Appointment in 1 week. - Dr. Celine Ahr RM 4 Anesthetic: Wound #1 Left,Medial Lower Leg: (In clinic) Topical Lidocaine 5% applied to wound bed - in clinic, prior to debridement Wound #2 Left,Posterior Lower Leg: (In clinic) Topical Lidocaine 5% applied to wound bed - in clinic, prior to debridement Bathing/ Shower/ Hygiene: May shower with protection but do not get wound dressing(s) wet. Edema Control - Lymphedema / SCD / Other: Avoid standing for long periods of time. Patient to wear own compression stockings every day. - Right lower extremity Moisturize legs daily. WOUND #1: - Lower Leg Wound Laterality: Left, Medial Cleanser: Soap and Water Discharge Instructions: May shower and wash wound with dial antibacterial soap and water prior to dressing change. Peri-Wound Care: Sween Lotion (Moisturizing lotion) Discharge Instructions: Apply moisturizing lotion as directed Topical: Triamcinolone Discharge Instructions: Apply Triamcinolone as directed Prim Dressing: KerraCel Ag Gelling Fiber Dressing, 2x2 in (silver alginate) ary Discharge Instructions: Apply silver alginate to wound bed as instructed Secondary Dressing: Zetuvit Plus Silicone Border Dressing 4x4 (in/in) Discharge Instructions: Apply silicone border over primary dressing as directed. Secured With: 36M Medipore Public affairs consultant Surgical T 2x10 (in/yd) ape Discharge Instructions: Secure with tape as directed. Com pression Wrap: ThreePress (3 layer compression wrap) Discharge Instructions: Apply three layer compression as directed. WOUND #2: - Lower Leg Wound  Laterality: Left, Posterior Cleanser: Soap and Water Discharge Instructions: May shower and wash wound with dial antibacterial soap and water prior to dressing change. Peri-Wound Care: Sween Lotion (Moisturizing lotion) Discharge Instructions: Apply moisturizing lotion as directed Topical: Triamcinolone Discharge Instructions: Apply Triamcinolone as directed Prim Dressing: Santyl Ointment ary Discharge Instructions: Apply nickel thick amount to wound bed as instructed Secondary Dressing: Zetuvit Plus Silicone Border Dressing 4x4 (in/in) Discharge Instructions: Apply silicone border over primary dressing as directed. Secured With: 36M Medipore Public affairs consultant Surgical T 2x10 (in/yd) ape Discharge Instructions: Secure with tape as directed. Com pression Wrap: ThreePress (3 layer compression wrap) Discharge Instructions: Apply three layer compression as directed. 08/13/2022: The anterior wound is closed. The more medial wound remains sensitive with a fairly fibrotic surface. There is more granulation tissue filling in, however, and there is less slough than on prior occasions. I used a curette to debride the slough from her medial wound. We will continue to apply a combination of topical gentamicin and Santyl to the wound for further enzymatic debridement and infection management. She is coming multiple times now with her wrap partially removed. She says that it starts to roll and so she cuts it away. We will try to alleviate this by using the first layer of Unna boot at the top of her wrap. Follow-up in 1 week. Electronic Signature(s) Signed: 08/13/2022 2:30:30 PM By: Fredirick Maudlin MD FACS Entered By: Fredirick Maudlin on 08/13/2022 14:30:29 -------------------------------------------------------------------------------- HxROS Details Patient Name: Date of Service: MARSHELL, RIEGER 08/13/2022 1:45 PM Medical Record Number: [id number] Patient Account Number: [id number] Date of Birth/Sex: Treating  RN:  1946-10-08 (76 y.o. F) Primary Care Provider: [name] Other Clinician: Referring Provider: Treating Provider/Extender: [name] Session in Treatment: 2 Information Obtained From Patient Eyes Medical History: LIS, SAVITT ([id number]) [id number] Page 8 of 9 Positive for: Cataracts - L eye Ear/Nose/Mouth/Throat Medical History: Negative for: Chronic sinus problems/congestion; Middle ear problems Hematologic/Lymphatic Medical History: Negative for: Anemia; Human Immunodeficiency Virus; Lymphedema Respiratory Medical History: Positive for: Asthma Past Medical History Notes: OSA on CPAP Cardiovascular Medical History: Positive for: Hypertension Past Medical History Notes: Cronic venous insufficiency 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 Gastrointestinal Medical History: Past Medical History Notes: liver cyst Endocrine Medical History: Negative for: Type I Diabetes; Type II Diabetes Musculoskeletal Medical History: Past Medical History Notes: arthritis Neurologic Medical History: Positive for: Neuropathy - Feet and finger Negative for: Seizure Disorder Past Medical History Notes: restless leg syndrome Oncologic Medical History: Positive for: Received Chemotherapy - 2012; Received Radiation - 2012 HBO Extended History Items Eyes: Cataracts Immunizations Pneumococcal Vaccine: Received Pneumococcal Vaccination: Yes Received Pneumococcal Vaccination On or After 60th Birthday: Yes Implantable Devices No devices added Hospitalization / Surgery History Type of Hospitalization/Surgery Lower extremity venography 2020 Intravascular ultrasound peripheral vascular intervention melanoma 2011 [facility], [facility] ([id number]) [id number] Page 9 of 9 Family and Social History Cancer: Yes - Mother,Paternal Grandparents; Diabetes: Yes - Siblings; Heart Disease:  Yes - Father; Hypertension: Yes - Father; Former smoker - quit in [employer]) Signed: 08/13/2022 2:31:37 PM By: Fredirick Maudlin MD FACS Entered By: Fredirick Maudlin on 08/13/2022 14:27:47 -------------------------------------------------------------------------------- [place] Details Patient Name: Date of Service: KRISTINA, MCNORTON 08/13/2022 Medical Record Number: [id number] Patient Account Number: [id number] Date of Birth/Sex: Treating RN: [birth year] (76 y.o. 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 I83.893 Varicose veins of bilateral lower extremities with other complications Z76.7 Venous insufficiency (chronic) (peripheral) G62.9 Polyneuropathy, unspecified R60.0 Localized edema Facility Procedures : CPT4 Code: [id number] Description: 24097 - DEBRIDE WOUND 1ST 20 SQ CM OR < ICD-10 Diagnosis Description L97.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 Modifier: d Quantity: 1 Physician Procedures : CPT4 Code Description Modifier [id number] - WC PHYS LEVEL 4 - EST PT 25 ICD-10 Diagnosis Description L97.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d I83.893 Varicose veins of bilateral lower extremities with  other complications M19.6 Polyneuropathy, unspecified R60.0 Localized edema Quantity: 1 : [id number] - WC PHYS DEBR WO ANESTH 20 SQ CM ICD-10 Diagnosis Description L97.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d Quantity: 1 Electronic Signature(s) Signed: 08/13/2022 2:30:48 PM By: Fredirick Maudlin MD FACS Entered By: Fredirick Maudlin on 08/13/2022 14:30:48

## 2022-08-17 NOTE — Progress Notes (Signed)
Amber, [name] ([id number]) [id number] Page 1 of 9 Visit Report for 08/13/2022 Arrival Information Details Patient Name: Date of Service: [name], [name] 08/13/2022 1:45 PM Medical Record Number: [id number] Patient Account Number: [id number] Date of Birth/Sex: Treating RN: 05/16/1946 (76 y.o. [name], [name] Primary Care Yehudah Standing: [name] Other Clinician: Referring Eulalie Speights: Treating Zachariah Pavek/Extender: [name] Session in Treatment: 2 Visit Information History Since Last Visit All ordered tests and consults were completed: Yes Patient Arrived: Ambulatory Added or deleted any medications: No Arrival Time: 14:04 Any new allergies or adverse reactions: No Accompanied By: self Had a fall or experienced change in No Transfer Assistance: None activities of daily living that may affect Patient Requires Transmission-Based Precautions: No risk of falls: Patient Has Alerts: No Hospitalized since last visit: No Implantable device outside of the clinic excluding No cellular tissue based products placed in the center since last visit: Has Dressing in Place as Prescribed: Yes Has Compression in Place as Prescribed: No Pain Present Now: Yes Electronic Signature(s) Signed: 08/17/2022 5:15:19 PM By: Blanche East RN Entered By: Blanche East on 08/13/2022 14:06:55 -------------------------------------------------------------------------------- Compression Therapy Details Patient Name: Date of Service: [name], [name] 08/13/2022 1:45 PM Medical Record Number: [id number] Patient Account Number: [id number] Date of Birth/Sex: Treating RN: 10-Mar-1946 (76 y.o. [name] Primary Care [name]: [name] Other Clinician: Referring Omega Durante: Treating [name]/Extender: [name] Session in Treatment: 2 Compression Therapy Performed for Wound Assessment: Wound #2 Left,Posterior Lower Leg Performed By:  Clinician Blanche East, RN Compression Type: Three Layer Post Procedure Diagnosis Same as Pre-procedure Electronic Signature(s) Signed: 08/17/2022 5:15:19 PM By: Blanche East RN Entered By: Blanche East on 08/13/2022 14:24:46 [name] ([id number]) [id number] Page 2 of 9 -------------------------------------------------------------------------------- Encounter Discharge Information Details Patient Name: Date of Service: [name], [name] 08/13/2022 1:45 PM Medical Record Number: [id number] Patient Account Number: [id number] Date of Birth/Sex: Treating RN: 01-26-1946 (76 y.o. [name] Primary Care Mekaylah Klich: [name] Other Clinician: Referring [name]: Treating Aislyn Hayse/Extender: [name] Session in Treatment: 2 Encounter Discharge Information Items Post Procedure Vitals Discharge Condition: Stable Temperature (F): 98.5 Ambulatory Status: Ambulatory Pulse (bpm): 76 Discharge Destination: Home Respiratory Rate (breaths/min): 18 Transportation: Private Auto Blood Pressure (mmHg): 171/93 Accompanied By: self Schedule Follow-up Appointment: Yes Clinical Summary of Care: Electronic Signature(s) Signed: 08/17/2022 5:15:19 PM By: Blanche East RN Entered By: Blanche East on 08/13/2022 14:38:53 -------------------------------------------------------------------------------- Lower Extremity Assessment Details Patient Name: Date of Service: [name], [name] 08/13/2022 1:45 PM Medical Record Number: [id number] Patient Account Number: [id number] Date of Birth/Sex: Treating RN: 12-24-1945 (76 y.o. [name] Primary Care [name]: [name] Other Clinician: Referring [name]: Treating [name]/Extender: [name] Weeks in Treatment: 2 Edema Assessment Assessed: [Left: No] [Right: No] [Left: Edema] [Right: :] Calf Left: Right: Point of Measurement: From Medial Instep 46.5  cm Ankle Left: Right: Point of Measurement: From Medial Instep 23.5 cm Vascular Assessment Pulses: Dorsalis Pedis Palpable: [Left:Yes] Electronic Signature(s) Signed: 08/17/2022 5:15:19 PM By: Blanche East RN Entered By: Blanche East on 08/13/2022 14:10:13 Multi Wound Chart Details -------------------------------------------------------------------------------- [name] ([id number]) [id number] Page 3 of 9 Patient Name: Date of Service: [name], [name] 08/13/2022 1:45 PM Medical Record Number: [id number] Patient Account Number: [id number] Date of Birth/Sex: Treating RN: Aug 20, 1946 (76 y.o. F) Primary Care [name]: [name] Other Clinician: Referring [name]: Treating [name]/Extender: [name] Session in Treatment: 2 Vital Signs Height(in): 63 Pulse(bpm): 76 Weight(lbs): 200 Blood Pressure(mmHg): 171/93 Body Mass Index(BMI): 35.4  Temperature(F): 98.5 Respiratory Rate(breaths/min): 18 Wound Assessments Wound Number: 1 2 N/A Photos: N/A Left, Medial Lower Leg Left, Posterior Lower Leg N/A Wound Location: Blister Blister N/A Wounding Event: Venous Leg Ulcer Venous Leg Ulcer N/A Primary Etiology: Cataracts, Asthma, Hypertension, Cataracts, Asthma, Hypertension, N/A Comorbid History: Neuropathy, Received Chemotherapy, Neuropathy, Received Chemotherapy, Received Radiation Received Radiation 07/05/2022 07/06/2022 N/A Date Acquired: 2 2 N/A Weeks of Treatment: Open Open N/A Wound Status: No No N/A Wound Recurrence: 0.1x0.1x0.1 3.6x2.6x0.1 N/A Measurements L x W x D (cm) 0.008 7.351 N/A A (cm) : rea 0.001 0.735 N/A Volume (cm) : 96.60% -7.00% N/A % Reduction in A rea: 95.80% -7.00% N/A % Reduction in Volume: Full Thickness Without Exposed Full Thickness Without Exposed N/A Classification: Support Structures Support Structures Medium Medium N/A Exudate A mount: Serosanguineous  Serosanguineous N/A Exudate Type: red, brown red, brown N/A Exudate Color: Flat and Intact Distinct, outline attached N/A Wound Margin: Small (1-33%) Medium (34-66%) N/A Granulation A mount: Pink Red N/A Granulation Quality: Large (67-100%) Medium (34-66%) N/A Necrotic A mount: Fat Layer (Subcutaneous Tissue): Yes Fat Layer (Subcutaneous Tissue): Yes N/A Exposed Structures: Fascia: No Fascia: No Tendon: No Tendon: No Muscle: No Muscle: No Joint: No Joint: No Bone: No Bone: No None None N/A Epithelialization: N/A Debridement - Selective/Open Wound N/A Debridement: Pre-procedure Verification/Time Out N/A 14:22 N/A Taken: N/A Lidocaine 5% topical ointment N/A Pain Control: N/A Slough N/A Tissue Debrided: N/A Non-Viable Tissue N/A Level: N/A 9.36 N/A Debridement A (sq cm): rea N/A Curette N/A Instrument: N/A Minimum N/A Bleeding: N/A Pressure N/A Hemostasis A chieved: N/A 0 N/A Procedural Pain: N/A 0 N/A Post Procedural Pain: N/A Procedure was tolerated well N/A Debridement Treatment Response: N/A 3.6x2.6x0.1 N/A Post Debridement Measurements L x W x D (cm) N/A 0.735 N/A Post Debridement Volume: (cm) N/A Compression Therapy N/A Procedures Performed: Debridement Treatment Notes Electronic Signature(s) Signed: 08/13/2022 2:26:37 PM By: Fredirick Maudlin MD [place], [name] ([id number]) [id number] Page 4 of 9 Signed: 08/13/2022 2:26:37 PM By: Fredirick Maudlin MD FACS Entered By: Fredirick Maudlin on 08/13/2022 14:26:37 -------------------------------------------------------------------------------- Multi-Disciplinary Care Plan Details Patient Name: Date of Service: [name], [name] 08/13/2022 1:45 PM Medical Record Number: [id number] Patient Account Number: [id number] Date of Birth/Sex: Treating RN: 10/16/1946 (76 y.o. [name] Primary Care Elianah Karis: [name] Other Clinician: Referring Hamda Klutts: Treating  Tailor Westfall/Extender: [name] Weeks in Treatment: 2 Active Inactive Wound/Skin Impairment Nursing Diagnoses: Impaired tissue integrity Goals: Ulcer/skin breakdown will have a volume reduction of 30% by week 4 Date Initiated: 08/03/2022 Target Resolution Date: 08/31/2022 Goal Status: Active Interventions: Assess ulceration(s) every visit Treatment Activities: Skin care regimen initiated : 08/03/2022 Notes: Electronic Signature(s) Signed: 08/17/2022 5:15:19 PM By: Blanche East RN Entered By: Blanche East on 08/13/2022 14:25:19 -------------------------------------------------------------------------------- Pain Assessment Details Patient Name: Date of Service: [name], [name] 08/13/2022 1:45 PM Medical Record Number: [id number] Patient Account Number: [id number] Date of Birth/Sex: Treating RN: 1946/04/29 (76 y.o. [name] Primary Care Kenny Stern: [name] Other Clinician: Referring Ozzie Knobel: Treating Amoy Steeves/Extender: [name] Session in Treatment: 2 Active Problems Location of Pain Severity and Description of Pain Patient Has Paino Yes Site Locations Rate the pain. YAILEEN, HOFFERBER ([id number]) [id number] Page 5 of 9 Rate the pain. Current Pain Level: 4 Character of Pain Describe the Pain: Aching Pain Management and Medication Current Pain Management: Electronic Signature(s) Signed: 08/17/2022 5:15:19 PM By: Blanche East RN Entered By: Blanche East on 08/13/2022 14:07:36 -------------------------------------------------------------------------------- Patient/Caregiver Education Details Patient Name: Date of  Service: [name], [name] 10/9/2023andnbsp1:45 PM Medical Record Number: [id number] Patient Account Number: [id number] Date of Birth/Gender: Treating RN: 03/11/1946 (76 y.o. [name] Primary Care Physician: [name] Other Clinician: Referring  Physician: Treating Physician/Extender: [name] Session in Treatment: 2 Education Assessment Education Provided To: Patient Education Topics Provided Wound/Skin Impairment: Methods: Explain/Verbal Responses: Reinforcements needed, State content correctly Electronic Signature(s) Signed: 08/17/2022 5:15:19 PM By: Blanche East RN Entered By: Blanche East on 08/13/2022 14:25:39 -------------------------------------------------------------------------------- Wound Assessment Details Patient Name: Date of Service: [name], [name] 08/13/2022 1:45 PM Medical Record Number: [id number] Patient Account Number: [id number] DAMIAH, MCDONALD ([id number]) [id number] Page 6 of 9 Date of Birth/Sex: Treating RN: May 14, 1946 (76 y.o. [name], [name] Primary Care Lamyah Creed: Other Clinician: [name] Referring Brentley Horrell: Treating Palak Tercero/Extender: [name] Weeks in Treatment: 2 Wound Status Wound Number: 1 Primary Venous Leg Ulcer Etiology: Wound Location: Left, Medial Lower Leg Wound Open Wounding Event: Blister Status: Date Acquired: 07/05/2022 Comorbid Cataracts, Asthma, Hypertension, Neuropathy, Received Weeks Of Treatment: 2 History: Chemotherapy, Received Radiation Clustered Wound: No Photos Wound Measurements Length: (cm) 0.1 Width: (cm) 0.1 Depth: (cm) 0.1 Area: (cm) 0.008 Volume: (cm) 0.001 % Reduction in Area: 96.6% % Reduction in Volume: 95.8% Epithelialization: None Tunneling: No Undermining: No Wound Description Classification: Full Thickness Without Exposed Suppor Wound Margin: Flat and Intact Exudate Amount: Medium Exudate Type: Serosanguineous Exudate Color: red, brown t Structures Foul Odor After Cleansing: No Slough/Fibrino Yes Wound Bed Granulation Amount: Small (1-33%) Exposed Structure Granulation Quality: Pink Fascia Exposed: No Necrotic Amount: Large (67-100%) Fat  Layer (Subcutaneous Tissue) Exposed: Yes Necrotic Quality: Adherent Slough Tendon Exposed: No Muscle Exposed: No Joint Exposed: No Bone Exposed: No Periwound Skin Texture Texture Color No Abnormalities Noted: No No Abnormalities Noted: No Moisture No Abnormalities Noted: No Treatment Notes Wound #1 (Lower Leg) Wound Laterality: Left, Medial Cleanser Soap and Water Discharge Instruction: May shower and wash wound with dial antibacterial soap and water prior to dressing change. Peri-Wound Care Sween Lotion (Moisturizing lotion) Discharge Instruction: Apply moisturizing lotion as directed Topical Triamcinolone Discharge Instruction: Apply Triamcinolone as directed [name], [name] ([id number]) [id number] Page 7 of 9 Primary Dressing KerraCel Ag Gelling Fiber Dressing, 2x2 in (silver alginate) Discharge Instruction: Apply silver alginate to wound bed as instructed Secondary Dressing Zetuvit Plus Silicone Border Dressing 4x4 (in/in) Discharge Instruction: Apply silicone border over primary dressing as directed. Secured With [employer] Surgical T 2x10 (in/yd) ape Discharge Instruction: Secure with tape as directed. Compression Wrap ThreePress (3 layer compression wrap) Discharge Instruction: Apply three layer compression as directed. Compression Stockings Add-Ons Electronic Signature(s) Signed: 08/17/2022 5:15:19 PM By: Blanche East RN Entered By: Blanche East on 08/13/2022 14:16:26 -------------------------------------------------------------------------------- Wound Assessment Details Patient Name: Date of Service: [name], [name] 08/13/2022 1:45 PM Medical Record Number: [id number] Patient Account Number: [id number] Date of Birth/Sex: Treating RN: [birth year] (75 y.o. [name], [name] Primary Care Nalaysia Manganiello: [name] Other Clinician: Referring Samaad Hashem: Treating Larell Baney/Extender: [name] Weeks in  Treatment: 2 Wound Status Wound Number: 2 Primary Venous Leg Ulcer Etiology: Wound Location: Left, Posterior Lower Leg Wound Open Wounding Event: Blister Status: Date Acquired: 07/06/2022 Comorbid Cataracts, Asthma, Hypertension, Neuropathy, Received Weeks Of Treatment: 2 History: Chemotherapy, Received Radiation Clustered Wound: No Photos Wound Measurements Length: (cm) 3.6 Width: (cm) 2.6 Depth: (cm) 0.1 Area: (cm) 7.351 Volume: (cm) 0.735 % Reduction in Area: -7% % Reduction in Volume: -7% Epithelialization: None Tunneling: No Undermining: No Wound Description Classification: Full Thickness Without  Exposed Support Structures Wound Margin: Distinct, outline attached Exudate Amount: Medium [name], [name] ([id number]) Exudate Type: Serosanguineous Exudate Color: red, brown Foul Odor After Cleansing: No Slough/Fibrino Yes [id number] Page 8 of 9 Wound Bed Granulation Amount: Medium (34-66%) Exposed Structure Granulation Quality: Red Fascia Exposed: No Necrotic Amount: Medium (34-66%) Fat Layer (Subcutaneous Tissue) Exposed: Yes Necrotic Quality: Adherent Slough Tendon Exposed: No Muscle Exposed: No Joint Exposed: No Bone Exposed: No Periwound Skin Texture Texture Color No Abnormalities Noted: No No Abnormalities Noted: No Moisture No Abnormalities Noted: No Treatment Notes Wound #2 (Lower Leg) Wound Laterality: Left, Posterior Cleanser Soap and Water Discharge Instruction: May shower and wash wound with dial antibacterial soap and water prior to dressing change. Peri-Wound Care Sween Lotion (Moisturizing lotion) Discharge Instruction: Apply moisturizing lotion as directed Topical Triamcinolone Discharge Instruction: Apply Triamcinolone as directed Primary Dressing Santyl Ointment Discharge Instruction: Apply nickel thick amount to wound bed as instructed Secondary Dressing Zetuvit Plus Silicone Border Dressing 4x4  (in/in) Discharge Instruction: Apply silicone border over primary dressing as directed. Secured With [employer] Surgical T 2x10 (in/yd) ape Discharge Instruction: Secure with tape as directed. Compression Wrap ThreePress (3 layer compression wrap) Discharge Instruction: Apply three layer compression as directed. Compression Stockings Add-Ons Electronic Signature(s) Signed: 08/17/2022 5:15:19 PM By: Blanche East RN Entered By: Blanche East on 08/13/2022 14:16:49 -------------------------------------------------------------------------------- Vitals Details Patient Name: Date of Service: [name], KOHN 08/13/2022 1:45 PM Medical Record Number: [id number] Patient Account Number: [id number] Date of Birth/Sex: Treating RN: 12/07/1945 (76 y.o. [name] Primary Care Olevia Westervelt: [name] Other Clinician: Referring Frances Ambrosino: Treating Tracie Lindbloom/Extender: [name] Session in Treatment: 2 MARNA, WENIGER ([id number]) [id number] Page 9 of 9 Vital Signs Time Taken: 14:07 Temperature (F): 98.5 Height (in): 63 Pulse (bpm): 76 Weight (lbs): 200 Respiratory Rate (breaths/min): 18 Body Mass Index (BMI): 35.4 Blood Pressure (mmHg): 171/93 Reference Range: 80 - 120 mg / dl Electronic Signature(s) Signed: 08/17/2022 5:15:19 PM By: Blanche East RN Entered By: Blanche East on 08/13/2022 14:07:20

## 2022-08-20 ENCOUNTER — Encounter (HOSPITAL_BASED_OUTPATIENT_CLINIC_OR_DEPARTMENT_OTHER): Payer: Medicare Other | Admitting: General Surgery

## 2022-08-20 DIAGNOSIS — I83893 Varicose veins of bilateral lower extremities with other complications: Secondary | ICD-10-CM | POA: Diagnosis not present

## 2022-08-20 DIAGNOSIS — L97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d: Secondary | ICD-10-CM | POA: Diagnosis not present

## 2022-08-20 DIAGNOSIS — G629 Polyneuropathy, unspecified: Secondary | ICD-10-CM | POA: Diagnosis not present

## 2022-08-20 DIAGNOSIS — I872 Venous insufficiency (chronic) (peripheral): Secondary | ICD-10-CM | POA: Diagnosis not present

## 2022-08-20 DIAGNOSIS — L97222 Non-pressure chronic ulcer of left calf with fat layer exposed: Secondary | ICD-10-CM | POA: Diagnosis not present

## 2022-08-20 DIAGNOSIS — R600 Localized edema: Secondary | ICD-10-CM | POA: Diagnosis not present

## 2022-08-20 DIAGNOSIS — Z87891 Personal history of nicotine dependence: Secondary | ICD-10-CM | POA: Diagnosis not present

## 2022-08-21 NOTE — Progress Notes (Signed)
[name] ([id number]) [id number] Page 1 of 9 Visit Report for 08/20/2022 Chief Complaint Document Details Patient Name: Date of Service: [name], [name] 08/20/2022 1:45 PM Medical Record Number: [id number] Patient Account Number: [id number] Date of Birth/Sex: Treating RN: 05-04-1946 (76 y.o. F) Primary Care Provider: [name] Other Clinician: Referring Provider: Treating Provider/Extender: [name] Session in Treatment: 3 Information Obtained from: Patient Chief Complaint Patient presents for treatment of an open ulcer due to venous insufficiency Electronic Signature(s) Signed: 08/20/2022 1:05:33 PM By: Fredirick Maudlin MD FACS Entered By: Fredirick Maudlin on 08/20/2022 16:05:33 -------------------------------------------------------------------------------- Debridement Details Patient Name: Date of Service: [name], [name] 08/20/2022 1:45 PM Medical Record Number: [id number] Patient Account Number: [id number] Date of Birth/Sex: Treating RN: [birth year] (76 y.o. [name], [name] Primary Care Provider: [name] Other Clinician: Referring Provider: Treating Provider/Extender: [name] Session in Treatment: 3 Debridement Performed for Assessment: Wound #2 Left,Posterior Lower Leg Performed By: Physician Fredirick Maudlin, MD Debridement Type: Debridement Severity of Tissue Pre Debridement: Fat layer exposed Level of Consciousness (Pre-procedure): Awake and Alert Pre-procedure Verification/Time Out Yes - 14:36 Taken: Start Time: 14:37 Pain Control: Lidocaine 5% topical ointment T Area Debrided (L x W): otal 4.5 (cm) x 2.2 (cm) = 9.9 (cm) Tissue and other material debrided: Non-Viable, Slough, Slough Level: Non-Viable Tissue Debridement Description: Selective/Open Wound Instrument: Curette Bleeding: Minimum Hemostasis Achieved: Pressure Procedural Pain: 0 Post Procedural Pain:  0 Response to Treatment: Procedure was tolerated well Level of Consciousness (Post- Awake and Alert procedure): Post Debridement Measurements of Total Wound Length: (cm) 4.5 Width: (cm) 2.2 Depth: (cm) 0.1 Volume: (cm) 0.778 Character of Wound/Ulcer Post Debridement: Requires Further Debridement Severity of Tissue Post Debridement: Fat layer exposed [name] ([id number]) [id number] Page 2 of 9 Post Procedure Diagnosis Same as Pre-procedure Notes Scribed for Dr. Celine Ahr by Blanche East, RN Electronic Signature(s) Signed: 08/20/2022 2:48:56 PM By: Fredirick Maudlin MD FACS Signed: 08/20/2022 9:48:15 PM By: Blanche East RN Entered By: Blanche East on 08/20/2022 14:38:50 -------------------------------------------------------------------------------- HPI Details Patient Name: Date of Service: [name] 08/20/2022 1:45 PM Medical Record Number: [id number] Patient Account Number: [id number] Date of Birth/Sex: Treating RN: [birth year] (76 y.o. F) Primary Care Provider: [name] Other Clinician: Referring Provider: Treating Provider/Extender: [name] Session in Treatment: 3 History of Present Illness HPI Description: ADMISSION This is a 76 year old woman with a history of chronic venous insufficiency status post saphenous vein ablations in 2010 and 2016. 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 She presents today with an open venous ulcer on her left lower leg. It has been present for a little over 2 weeks. She saw her primary care provider who apparently swabbed the wound and grew out Pseudomonas. She just completed a course of ciprofloxacin for this. ABI was 0.91. She reports that she has had previous issues with ulcers in this same location, stemming back to a punch biopsy taken by a dermatologist many years ago. She has had several skin substitutes applied to the area that have  ultimately resulted in healing on prior occasions. She has 2 small ulcers on her left medial lower leg. There is slough accumulation in both of them. The more anterior of the 2 is quite small and has some soft slough on the surface, underneath which there is good granulation tissue. The more medial wound also has slough accumulation, but the underlying surface is fairly fibrotic and gritty. This is consistent with her provided history of multiple  ulcers in the same location. 08/03/2022: The anterior wound is smaller today with just a little bit of slough accumulation. The more medial wound continues to be very sensitive and fibrotic with slough buildup. 08/13/2022: The anterior wound is closed. The more medial wound remains sensitive with a fairly fibrotic surface. There is more granulation tissue filling in, however, and there is less slough than on prior occasions. 08/20/2022: The anterior wound remains closed. The more medial wound has filled with granulation tissue but still has a fair amount of slough on the surface and remains fairly tender. Unfortunately, she has developed 2 small ulcers just proximal to this. The fat layer is exposed in each. She says that over the weekend, she felt a burning sensation in that location. Electronic Signature(s) Signed: 08/20/2022 1:06:49 PM By: Fredirick Maudlin MD FACS Entered By: Fredirick Maudlin on 08/20/2022 16:06:49 -------------------------------------------------------------------------------- Physical Exam Details Patient Name: Date of Service: [name], [name] 08/20/2022 1:45 PM Medical Record Number: [id number] Patient Account Number: [id number] Date of Birth/Sex: Treating RN: [birth year] (76 y.o. F) Primary Care Provider: [name] Other Clinician: Referring Provider: Treating Provider/Extender: [name] Session in Treatment: 3 Constitutional Hypertensive, asymptomatic. Tachycardic, asymptomatic.. . . No acute  distress.Marland Kitchen [name], [name] ([id number]) [id number] Page 3 of 9 Respiratory Normal work of breathing on room air.. Notes 08/20/2022: The anterior wound remains closed. The more medial wound has filled with granulation tissue but still has a fair amount of slough on the surface and remains fairly tender. Unfortunately, she has developed 2 small ulcers just proximal to this. The fat layer is exposed in each. Electronic Signature(s) Signed: 08/20/2022 1:07:23 PM By: Fredirick Maudlin MD FACS Entered By: Fredirick Maudlin on 08/20/2022 16:07:23 -------------------------------------------------------------------------------- Physician Orders Details Patient Name: Date of Service: [name], [name] 08/20/2022 1:45 PM Medical Record Number: [id number] Patient Account Number: [id number] Date of Birth/Sex: Treating RN: [birth year] (76 y.o. [name], [name] Primary Care Provider: [name] Other Clinician: Referring Provider: Treating Provider/Extender: [name] Session in Treatment: 3 Verbal / Phone Orders: No Diagnosis Coding ICD-10 Coding Code Description [phone] Non-pressure chronic ulcer of other part of left lower leg with fat layer exposed I83.893 Varicose veins of bilateral lower extremities with other complications D98.3 Venous insufficiency (chronic) (peripheral) G62.9 Polyneuropathy, unspecified R60.0 Localized edema Follow-up Appointments ppointment in 1 week. - Dr. Celine Ahr RM 4 Return A Anesthetic Wound #2 Left,Posterior Lower Leg (In clinic) Topical Lidocaine 5% applied to wound bed - in clinic, prior to debridement Bathing/ Shower/ Hygiene May shower with protection but do not get wound dressing(s) wet. Edema Control - Lymphedema / SCD / Other Left Lower Extremity Avoid standing for long periods of time. Patient to wear own compression stockings every day. - Right lower extremity Moisturize legs daily. Wound  Treatment Wound #2 - Lower Leg Wound Laterality: Left, Posterior Cleanser: Soap and Water Discharge Instructions: May shower and wash wound with dial antibacterial soap and water prior to dressing change. Peri-Wound Care: Sween Lotion (Moisturizing lotion) Discharge Instructions: Apply moisturizing lotion as directed Topical: Triamcinolone Discharge Instructions: Apply Triamcinolone as directed Prim Dressing: Hydrofera Blue Ready Foam, 4x5 in ary Discharge Instructions: Apply to wound bed as instructed Prim Dressing: Santyl Ointment ary Discharge Instructions: Apply to larger posterior. wound only Secondary Dressing: Zetuvit Plus Silicone Border Dressing 4x4 (in/in) Discharge Instructions: Apply silicone border over primary dressing as directed. [name], [name] ([id number]) [id number] Page 4 of 9 Secured With: 66M Medipore Public affairs consultant Surgical T 2x10 (in/yd) ape Discharge Instructions:  Secure with tape as directed. Compression Wrap: ThreePress (3 layer compression wrap) Discharge Instructions: Apply three layer compression as directed. Electronic Signature(s) Signed: 08/20/2022 2:48:56 PM By: Fredirick Maudlin MD FACS Entered By: Fredirick Maudlin on 08/20/2022 16:09:01 -------------------------------------------------------------------------------- Problem List Details Patient Name: Date of Service: [name], [name] 08/20/2022 1:45 PM Medical Record Number: [id number] Patient Account Number: [id number] Date of Birth/Sex: Treating RN: 26-Oct-1946 (76 y.o. F) Primary Care Provider: [name] Other Clinician: Referring Provider: Treating Provider/Extender: [name] Session in Treatment: 3 Active Problems ICD-10 Encounter Code Description Active Date MDM Diagnosis [phone] Non-pressure chronic ulcer of other part of left lower leg with fat layer exposed 07/27/2022 No Yes I83.893 Varicose veins of bilateral lower extremities  with other complications 12/05/8655 No Yes I87.2 Venous insufficiency (chronic) (peripheral) 07/27/2022 No Yes G62.9 Polyneuropathy, unspecified 07/27/2022 No Yes R60.0 Localized edema 07/27/2022 No Yes Inactive Problems Resolved Problems Electronic Signature(s) Signed: 08/20/2022 1:05:16 PM By: Fredirick Maudlin MD FACS Entered By: Fredirick Maudlin on 08/20/2022 16:05:16 Progress Note Details -------------------------------------------------------------------------------- [name] ([id number]) [id number] Page 5 of 9 Patient Name: Date of Service: [name], [name] 08/20/2022 1:45 PM Medical Record Number: [id number] Patient Account Number: [id number] Date of Birth/Sex: Treating RN: 1946-09-18 (76 y.o. F) Primary Care Provider: [name] Other Clinician: Referring Provider: Treating Provider/Extender: [name] Session in Treatment: 3 Subjective Chief Complaint Information obtained from Patient Patient presents for treatment of an open ulcer due to venous insufficiency History of Present Illness (HPI) ADMISSION This is a 76 year old woman with a history of chronic venous insufficiency status post saphenous vein ablations in 2010 and 2016. 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 She presents today with an open venous ulcer on her left lower leg. It has been present for a little over 2 weeks. She saw her primary care provider who apparently swabbed the wound and grew out Pseudomonas. She just completed a course of ciprofloxacin for this. ABI was 0.91. She reports that she has had previous issues with ulcers in this same location, stemming back to a punch biopsy taken by a dermatologist many years ago. She has had several skin substitutes applied to the area that have ultimately resulted in healing on prior occasions. She has 2 small ulcers on her left medial lower leg. There is slough accumulation in  both of them. The more anterior of the 2 is quite small and has some soft slough on the surface, underneath which there is good granulation tissue. The more medial wound also has slough accumulation, but the underlying surface is fairly fibrotic and gritty. This is consistent with her provided history of multiple ulcers in the same location. 08/03/2022: The anterior wound is smaller today with just a little bit of slough accumulation. The more medial wound continues to be very sensitive and fibrotic with slough buildup. 08/13/2022: The anterior wound is closed. The more medial wound remains sensitive with a fairly fibrotic surface. There is more granulation tissue filling in, however, and there is less slough than on prior occasions. 08/20/2022: The anterior wound remains closed. The more medial wound has filled with granulation tissue but still has a fair amount of slough on the surface and remains fairly tender. Unfortunately, she has developed 2 small ulcers just proximal to this. The fat layer is exposed in each. She says that over the weekend, she felt a burning sensation in that location. Patient History Information obtained from Patient. Family History Cancer - Mother,Paternal Grandparents, Diabetes -  Siblings, Heart Disease - Father, Hypertension - Father. Social History Former smoker - quit in 1967. Medical History Eyes Patient has history of Cataracts - L eye Ear/Nose/Mouth/Throat Denies history of Chronic sinus problems/congestion, Middle ear problems Hematologic/Lymphatic Denies history of Anemia, Human Immunodeficiency Virus, Lymphedema Respiratory Patient has history of Asthma Cardiovascular Patient has history of Hypertension Endocrine Denies history of Type I Diabetes, Type II Diabetes Neurologic Patient has history of Neuropathy - Feet and finger Denies history of Seizure Disorder Oncologic Patient has history of Received Chemotherapy - 2012, Received Radiation -  2012 Hospitalization/Surgery History - Lower extremity venography 2020. - Intravascular ultrasound. - peripheral vascular intervention. - melanoma 2011. Medical A Surgical History Notes nd Respiratory OSA on CPAP Cardiovascular Cronic venous insufficiency 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 Gastrointestinal liver cyst Musculoskeletal arthritis Neurologic restless leg syndrome [name], [name] ([id number]) [id number] Page 6 of 9 Objective Constitutional Hypertensive, asymptomatic. Tachycardic, asymptomatic.Marland Kitchen No acute distress.. Vitals Time Taken: 2:09 AM, Height: 63 in, Weight: 200 lbs, BMI: 35.4, Temperature: 98.2 F, Pulse: 120 bpm, Respiratory Rate: 20 breaths/min, Blood Pressure: 158/85 mmHg. General Notes: rechecked HR manually 98bpm Respiratory Normal work of breathing on room air.. General Notes: 08/20/2022: The anterior wound remains closed. The more medial wound has filled with granulation tissue but still has a fair amount of slough on the surface and remains fairly tender. Unfortunately, she has developed 2 small ulcers just proximal to this. The fat layer is exposed in each. Integumentary (Hair, Skin) Wound #1 status is Healed - Epithelialized. Original cause of wound was Blister. The date acquired was: 07/05/2022. The wound has been in treatment 3 weeks. The wound is located on the Left,Medial Lower Leg. The wound measures 0cm length x 0cm width x 0cm depth; 0cm^2 area and 0cm^3 volume. There is no tunneling or undermining noted. There is a none present amount of drainage noted. The wound margin is flat and intact. There is large (67-100%) granulation within the wound bed. There is no necrotic tissue within the wound bed. The periwound skin appearance had no abnormalities noted for texture. The periwound skin appearance had no abnormalities noted for color. The periwound skin appearance exhibited: Dry/Scaly. The periwound  skin appearance did not exhibit: Maceration. Wound #2 status is Open. Original cause of wound was Blister. The date acquired was: 07/06/2022. The wound has been in treatment 3 weeks. The wound is located on the Left,Posterior Lower Leg. The wound measures 4.5cm length x 2.5cm width x 0.1cm depth; 8.836cm^2 area and 0.884cm^3 volume. There is Fat Layer (Subcutaneous Tissue) exposed. There is a medium amount of serosanguineous drainage noted. There is medium (34-66%) red granulation within the wound bed. There is a medium (34-66%) amount of necrotic tissue within the wound bed including Adherent Slough. The periwound skin appearance did not exhibit: Maceration. The periwound has tenderness on palpation. Assessment Active Problems ICD-10 Non-pressure chronic ulcer of other part of left lower leg with fat layer exposed Varicose veins of bilateral lower extremities with other complications Venous insufficiency (chronic) (peripheral) Polyneuropathy, unspecified Localized edema Procedures Wound #2 Pre-procedure diagnosis of Wound #2 is a Venous Leg Ulcer located on the Left,Posterior Lower Leg .Severity of Tissue Pre Debridement is: Fat layer exposed. There was a Selective/Open Wound Non-Viable Tissue Debridement with a total area of 9.9 sq cm performed by Fredirick Maudlin, MD. With the following instrument(s): Curette to remove Non-Viable tissue/material. Material removed includes [facility] after achieving pain control using Lidocaine 5% topical ointment. No specimens were taken. A  time out was conducted at 14:36, prior to the start of the procedure. A Minimum amount of bleeding was controlled with Pressure. The procedure was tolerated well with a pain level of 0 throughout and a pain level of 0 following the procedure. Post Debridement Measurements: 4.5cm length x 2.2cm width x 0.1cm depth; 0.778cm^3 volume. Character of Wound/Ulcer Post Debridement requires further debridement. Severity of Tissue Post  Debridement is: Fat layer exposed. Post procedure Diagnosis Wound #2: Same as Pre-Procedure General Notes: Scribed for Dr. Celine Ahr by Blanche East, RN. Pre-procedure diagnosis of Wound #2 is a Venous Leg Ulcer located on the Left,Posterior Lower Leg . There was a Three Layer Compression Therapy Procedure by Blanche East, RN. Post procedure Diagnosis Wound #2: Same as Pre-Procedure Plan Follow-up Appointments: Return Appointment in 1 week. - Dr. Celine Ahr RM 4 Anesthetic: Wound #2 Left,Posterior Lower Leg: (In clinic) Topical Lidocaine 5% applied to wound bed - in clinic, prior to debridement Bathing/ Shower/ Hygiene: May shower with protection but do not get wound dressing(s) wet. Edema Control - Lymphedema / SCD / Other: Avoid standing for long periods of time. Patient to wear own compression stockings every day. - Right lower extremity Moisturize legs daily. WOUND #2: - Lower Leg Wound Laterality: Left, Posterior Cleanser: Soap and [address] DESTONY, PREVOST ([id number]) [id number] Page 7 of 9 Discharge Instructions: May shower and wash wound with dial antibacterial soap and water prior to dressing change. Peri-Wound Care: Sween Lotion (Moisturizing lotion) Discharge Instructions: Apply moisturizing lotion as directed Topical: Triamcinolone Discharge Instructions: Apply Triamcinolone as directed Prim Dressing: Hydrofera Blue Ready Foam, 4x5 in ary Discharge Instructions: Apply to wound bed as instructed Prim Dressing: Santyl Ointment ary Discharge Instructions: Apply to larger posterior. wound only Secondary Dressing: Zetuvit Plus Silicone Border Dressing 4x4 (in/in) Discharge Instructions: Apply silicone border over primary dressing as directed. Secured With: 76M Medipore Public affairs consultant Surgical T 2x10 (in/yd) ape Discharge Instructions: Secure with tape as directed. Com pression Wrap: ThreePress (3 layer compression wrap) Discharge Instructions: Apply three  layer compression as directed. 08/20/2022: The anterior wound remains closed. The more medial wound has filled with granulation tissue but still has a fair amount of slough on the surface and remains fairly tender. Unfortunately, she has developed 2 small ulcers just proximal to this. The fat layer is exposed in each. I used a curette to debride slough from her wounds. We will continue to apply Santyl to the larger wound, but I am going to cover this with [facility] and also use Hydrofera Blue without Santyl to the 2 new wounds. Continue 3 layer compression with Unna boot at the top. Follow-up in 1 week. Electronic Signature(s) Signed: 08/20/2022 1:10:08 PM By: Fredirick Maudlin MD FACS Entered By: Fredirick Maudlin on 08/20/2022 16:10:08 -------------------------------------------------------------------------------- HxROS Details Patient Name: Date of Service: [name], [name] 08/20/2022 1:45 PM Medical Record Number: [id number] Patient Account Number: [id number] Date of Birth/Sex: Treating RN: 11-17-1945 (76 y.o. F) Primary Care Provider: [name] Other Clinician: Referring Provider: Treating Provider/Extender: [name] Session in Treatment: 3 Information Obtained From Patient Eyes Medical History: Positive for: Cataracts - L eye Ear/Nose/Mouth/Throat Medical History: Negative for: Chronic sinus problems/congestion; Middle ear problems Hematologic/Lymphatic Medical History: Negative for: Anemia; Human Immunodeficiency Virus; Lymphedema Respiratory Medical History: Positive for: Asthma Past Medical History Notes: OSA on CPAP Cardiovascular Medical History: Positive for: Hypertension Past Medical History Notes: Cronic venous insufficiency 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 ATALIE, OROS ([id number]) [id number] Page 8 of 9  Gastrointestinal Medical History: Past Medical History  Notes: liver cyst Endocrine Medical History: Negative for: Type I Diabetes; Type II Diabetes Musculoskeletal Medical History: Past Medical History Notes: arthritis Neurologic Medical History: Positive for: Neuropathy - Feet and finger Negative for: Seizure Disorder Past Medical History Notes: restless leg syndrome Oncologic Medical History: Positive for: Received Chemotherapy - 2012; Received Radiation - 2012 HBO Extended History Items Eyes: Cataracts Immunizations Pneumococcal Vaccine: Received Pneumococcal Vaccination: Yes Received Pneumococcal Vaccination On or After 60th Birthday: Yes Implantable Devices No devices added Hospitalization / Surgery History Type of Hospitalization/Surgery Lower extremity venography 2020 Intravascular ultrasound peripheral vascular intervention melanoma 2011 Family and Social History Cancer: Yes - Mother,Paternal Grandparents; Diabetes: Yes - Siblings; Heart Disease: Yes - Father; Hypertension: Yes - Father; Former smoker - quit in [employer]) Signed: 08/20/2022 2:48:56 PM By: Fredirick Maudlin MD FACS Entered By: Fredirick Maudlin on 08/20/2022 16:06:55 -------------------------------------------------------------------------------- SuperBill Details Patient Name: Date of Service: [name], [name] 08/20/2022 Medical Record Number: [id number] Patient Account Number: [id number] Date of Birth/Sex: Treating RN: Aug 11, 1946 (76 y.o. 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 exposed I83.893 Varicose veins of bilateral lower extremities with other complications G38.7 Venous insufficiency  (chronic) (peripheral) G62.9 Polyneuropathy, unspecified R60.0 Localized edema Facility Procedures : CPT4 Code: [id number] Description: 18841 - DEBRIDE WOUND 1ST 20 SQ CM OR < ICD-10 Diagnosis Description L97.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 Modifier: d Quantity: 1 Physician Procedures : CPT4 Code Description Modifier [id number] - WC PHYS LEVEL 4 - EST PT 25 ICD-10 Diagnosis Description L97.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d I87.2 Venous insufficiency (chronic) (peripheral) R60.0  Localized edema I83.893 Varicose veins of bilateral lower extremities with other complications Quantity: 1 : [id number] - WC PHYS DEBR WO ANESTH 20 SQ CM ICD-10 Diagnosis Description L97.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d Quantity: 1 Electronic Signature(s) Signed: 08/20/2022 1:10:27 PM By: Fredirick Maudlin MD FACS Entered By: Fredirick Maudlin on 08/20/2022 16:10:27

## 2022-08-24 NOTE — Progress Notes (Signed)
BRIGHTEN, BUZZELLI ([id number]) [id number] Page 1 of 9 Visit Report for 08/20/2022 Arrival Information Details Patient Name: Date of Service: [name], [name] 08/20/2022 1:45 PM Medical Record Number: [id number] Patient Account Number: [id number] Date of Birth/Sex: Treating RN: 31-Mar-1946 (76 y.o. F) Primary Care Torrence Branagan: [name] Other Clinician: Referring Cyndee Giammarco: Treating Sariyah Corcino/Extender: [name] Session in Treatment: 3 Visit Information History Since Last Visit All ordered tests and consults were completed: No Patient Arrived: Ambulatory Added or deleted any medications: No Arrival Time: 02:09 Any new allergies or adverse reactions: No Transfer Assistance: None Had a fall or experienced change in No Patient Identification Verified: Yes activities of daily living that may affect Secondary Verification Process Completed: Yes risk of falls: Patient Requires Transmission-Based Precautions: No Signs or symptoms of abuse/neglect since last visito No Patient Has Alerts: No Hospitalized since last visit: No Implantable device outside of the clinic excluding No cellular tissue based products placed in the center since last visit: Pain Present Now: Yes Electronic Signature(s) Signed: 08/24/2022 11:34:44 AM By: Worthy Rancher Entered By: Worthy Rancher on 08/20/2022 14:09:57 -------------------------------------------------------------------------------- Compression Therapy Details Patient Name: Date of Service: [name], [name] 08/20/2022 1:45 PM Medical Record Number: [id number] Patient Account Number: [id number] Date of Birth/Sex: Treating RN: 07-12-1946 (76 y.o. [name] Primary Care Cimberly Stoffel: [name] Other Clinician: Referring Ammara Raj: Treating Cedrik Heindl/Extender: [name] Session in Treatment: 3 Compression Therapy Performed for Wound Assessment: Wound #2 Left,Posterior Lower  Leg Performed By: Clinician Blanche East, RN Compression Type: Three Layer Post Procedure Diagnosis Same as Pre-procedure Electronic Signature(s) Signed: 08/20/2022 9:48:15 PM By: Blanche East RN Entered By: Blanche East on 08/20/2022 14:39:34 [name] ([id number]) [id number] Page 2 of 9 -------------------------------------------------------------------------------- Encounter Discharge Information Details Patient Name: Date of Service: [name], [name] 08/20/2022 1:45 PM Medical Record Number: [id number] Patient Account Number: [id number] Date of Birth/Sex: Treating RN: 1946/10/29 (76 y.o. [name] Primary Care Gorgeous Newlun: [name] Other Clinician: Referring Evan Mackie: Treating Lallie Strahm/Extender: [name] Session in Treatment: 3 Encounter Discharge Information Items Post Procedure Vitals Discharge Condition: Stable Temperature (F): 98.2 Ambulatory Status: Ambulatory Pulse (bpm): 98 Discharge Destination: Home Respiratory Rate (breaths/min): 20 Transportation: Private Auto Blood Pressure (mmHg): 158/85 Accompanied By: self Schedule Follow-up Appointment: Yes Clinical Summary of Care: Electronic Signature(s) Signed: 08/20/2022 9:48:15 PM By: Blanche East RN Entered By: Blanche East on 08/20/2022 15:01:32 -------------------------------------------------------------------------------- Lower Extremity Assessment Details Patient Name: Date of Service: [name], [name] 08/20/2022 1:45 PM Medical Record Number: [id number] Patient Account Number: [id number] Date of Birth/Sex: Treating RN: 22-Jun-1946 (76 y.o. [name] Primary Care Danissa Rundle: [name] Other Clinician: Referring Brennah Quraishi: Treating Vivion Romano/Extender: Pixie Casino Weeks in Treatment: 3 Edema Assessment Assessed: [Left: No] [Right: No] [Left: Edema] [Right: :] Calf Left: Right: Point of Measurement: From  Medial Instep 39.5 cm Ankle Left: Right: Point of Measurement: From Medial Instep 23 cm Vascular Assessment Pulses: Dorsalis Pedis Palpable: [Left:Yes] Electronic Signature(s) Signed: 08/20/2022 9:48:15 PM By: Blanche East RN Entered By: Blanche East on 08/20/2022 14:22:40 Multi Wound Chart Details -------------------------------------------------------------------------------- [name] ([id number]) [id number] Page 3 of 9 Patient Name: Date of Service: [name], [name] 08/20/2022 1:45 PM Medical Record Number: [id number] Patient Account Number: [id number] Date of Birth/Sex: Treating RN: 14-Apr-1946 (76 y.o. F) Primary Care Pedrohenrique Mcconville: [name] Other Clinician: Referring Vegas Fritze: Treating Wayland Baik/Extender: [name] Session in Treatment: 3 Vital Signs Height(in): 63 Pulse(bpm): 120 Weight(lbs): 200 Blood Pressure(mmHg): 158/85 Body Mass Index(BMI): 35.4 Temperature(F): 98.2  Respiratory Rate(breaths/min): 20 Wound Assessments Wound Number: 1 2 N/A Photos: N/A Left, Medial Lower Leg Left, Posterior Lower Leg N/A Wound Location: Blister Blister N/A Wounding Event: Venous Leg Ulcer Venous Leg Ulcer N/A Primary Etiology: Cataracts, Asthma, Hypertension, Cataracts, Asthma, Hypertension, N/A Comorbid History: Neuropathy, Received Chemotherapy, Neuropathy, Received Chemotherapy, Received Radiation Received Radiation 07/05/2022 07/06/2022 N/A Date Acquired: 3 3 N/A Weeks of Treatment: Healed - Epithelialized Open N/A Wound Status: No No N/A Wound Recurrence: 0x0x0 4.5x2.5x0.1 N/A Measurements L x W x D (cm) 0 8.836 N/A A (cm) : rea 0 0.884 N/A Volume (cm) : 96.60% -28.60% N/A % Reduction in A rea: 95.80% -28.70% N/A % Reduction in Volume: Full Thickness Without Exposed Full Thickness Without Exposed N/A Classification: Support Structures Support Structures None Present Medium N/A Exudate A  mount: N/A Serosanguineous N/A Exudate Type: N/A red, brown N/A Exudate Color: Flat and Intact N/A N/A Wound Margin: Large (67-100%) Medium (34-66%) N/A Granulation A mount: N/A Red N/A Granulation Quality: None Present (0%) Medium (34-66%) N/A Necrotic A mount: Fascia: No Fat Layer (Subcutaneous Tissue): Yes N/A Exposed Structures: Fat Layer (Subcutaneous Tissue): No Fascia: No Tendon: No Tendon: No Muscle: No Muscle: No Joint: No Joint: No Bone: No Bone: No None N/A N/A Epithelialization: N/A Debridement - Selective/Open Wound N/A Debridement: Pre-procedure Verification/Time Out N/A 14:36 N/A Taken: N/A Lidocaine 5% topical ointment N/A Pain Control: N/A Slough N/A Tissue Debrided: N/A Non-Viable Tissue N/A Level: N/A 9.9 N/A Debridement A (sq cm): rea N/A Curette N/A Instrument: N/A Minimum N/A Bleeding: N/A Pressure N/A Hemostasis A chieved: N/A 0 N/A Procedural Pain: N/A 0 N/A Post Procedural Pain: N/A Procedure was tolerated well N/A Debridement Treatment Response: N/A 4.5x2.2x0.1 N/A Post Debridement Measurements L x W x D (cm) N/A 0.778 N/A Post Debridement Volume: (cm) No Abnormalities Noted N/A Periwound Skin Texture: Dry/Scaly: Yes Maceration: No N/A Periwound Skin Moisture: Maceration: No No Abnormalities Noted N/A Periwound Skin Color: N/A Yes N/A Tenderness on Palpation: N/A Compression Therapy N/A Procedures Performed: Debridement LILEIGH, FAHRINGER ([id number]) [id number] Page 4 of 9 Treatment Notes Wound #1 (Lower Leg) Wound Laterality: Left, Medial Cleanser Peri-Wound Care Topical Primary Dressing Secondary Dressing Secured With Compression Wrap Compression Stockings Add-Ons Wound #2 (Lower Leg) Wound Laterality: Left, Posterior Cleanser Soap and Water Discharge Instruction: May shower and wash wound with dial antibacterial soap and water prior to dressing change. Peri-Wound Care Sween  Lotion (Moisturizing lotion) Discharge Instruction: Apply moisturizing lotion as directed Topical Triamcinolone Discharge Instruction: Apply Triamcinolone as directed Primary Dressing Hydrofera Blue Ready Foam, 4x5 in Discharge Instruction: Apply to wound bed as instructed Santyl Ointment Discharge Instruction: Apply to larger posterior. wound only Secondary Dressing Zetuvit Plus Silicone Border Dressing 4x4 (in/in) Discharge Instruction: Apply silicone border over primary dressing as directed. Secured With [employer] Surgical T 2x10 (in/yd) ape Discharge Instruction: Secure with tape as directed. Compression Wrap ThreePress (3 layer compression wrap) Discharge Instruction: Apply three layer compression as directed. Compression Stockings Add-Ons Electronic Signature(s) Signed: 08/20/2022 1:05:27 PM By: Fredirick Maudlin MD FACS Entered By: Fredirick Maudlin on 08/20/2022 16:05:27 -------------------------------------------------------------------------------- Multi-Disciplinary Care Plan Details Patient Name: Date of Service: [name], [name] 08/20/2022 1:45 PM Medical Record Number: [id number] Patient Account Number: [id number] Date of Birth/Sex: Treating RN: 1946-10-26 (76 y.o. [name] Primary Care Arabela Basaldua: [name] Other Clinician: DOMONIQUE, COTHRAN ([id number]) [id number] Page 5 of 9 Referring Hiilani Jetter: Treating Exie Chrismer/Extender: [name] Session in Treatment: 3 Active Inactive Wound/Skin Impairment Nursing Diagnoses: Impaired  tissue integrity Goals: Ulcer/skin breakdown will have a volume reduction of 30% by week 4 Date Initiated: 08/03/2022 Target Resolution Date: 08/31/2022 Goal Status: Active Interventions: Assess ulceration(s) every visit Treatment Activities: Skin care regimen initiated : 08/03/2022 Notes: Electronic Signature(s) Signed: 08/20/2022 9:48:15 PM By: Blanche East  RN Entered By: Blanche East on 08/20/2022 14:31:11 -------------------------------------------------------------------------------- Pain Assessment Details Patient Name: Date of Service: [name], [name] 08/20/2022 1:45 PM Medical Record Number: [id number] Patient Account Number: [id number] Date of Birth/Sex: Treating RN: 1946-09-02 (76 y.o. F) Primary Care Maleaha [name]: [name] Other Clinician: Referring Ziyonna Christner: Treating Saretta Dahlem/Extender: [name] Session in Treatment: 3 Active Problems Location of Pain Severity and Description of Pain Patient Has Paino Yes Site Locations Rate the pain. Current Pain Level: 4 Worst Pain Level: 10 Least Pain Level: 0 Tolerable Pain Level: 5 Character of Pain Describe the Pain: Burning Pain Management and Medication Current Pain Management: KYLENE, ZAMARRON ([id number]) [id number] Page 6 of 9 Electronic Signature(s) Signed: 08/24/2022 11:34:44 AM By: Worthy Rancher Entered By: Worthy Rancher on 08/20/2022 14:12:10 -------------------------------------------------------------------------------- Patient/Caregiver Education Details Patient Name: Date of Service: [name], [name] 10/16/2023andnbsp1:45 PM Medical Record Number: [id number] Patient Account Number: [id number] Date of Birth/Gender: Treating RN: May 27, 1946 (76 y.o. [name] Primary Care Physician: [name] Other Clinician: Referring Physician: Treating Physician/Extender: [name] Session in Treatment: 3 Education Assessment Education Provided To: Patient Education Topics Provided Wound/Skin Impairment: Methods: Explain/Verbal Responses: Reinforcements needed, State content correctly Electronic Signature(s) Signed: 08/20/2022 9:48:15 PM By: Blanche East RN Entered By: Blanche East on 08/20/2022  14:31:26 -------------------------------------------------------------------------------- Wound Assessment Details Patient Name: Date of Service: [name], [name] 08/20/2022 1:45 PM Medical Record Number: [id number] Patient Account Number: [id number] Date of Birth/Sex: Treating RN: 01/19/1946 (76 y.o. [name] Primary Care Ceylin Dreibelbis: [name] Other Clinician: Referring Marsalis Beaulieu: Treating Charlaine Utsey/Extender: Pixie Casino Weeks in Treatment: 3 Wound Status Wound Number: 1 Primary Venous Leg Ulcer Etiology: Wound Location: Left, Medial Lower Leg Wound Healed - Epithelialized Wounding Event: Blister Status: Date Acquired: 07/05/2022 Comorbid Cataracts, Asthma, Hypertension, Neuropathy, Received Weeks Of Treatment: 3 History: Chemotherapy, Received Radiation Clustered Wound: No Photos CHRISTYANNA, MCKEON ([id number]) [id number] Page 7 of 9 Wound Measurements Length: (cm) Width: (cm) Depth: (cm) Area: (cm) Volume: (cm) 0 % Reduction in Area: 96.6% 0 % Reduction in Volume: 95.8% 0 Epithelialization: None 0 Tunneling: No 0 Undermining: No Wound Description Classification: Full Thickness Without Exposed Support Wound Margin: Flat and Intact Exudate Amount: None Present Structures Foul Odor After Cleansing: No Slough/Fibrino No Wound Bed Granulation Amount: Large (67-100%) Exposed Structure Necrotic Amount: None Present (0%) Fascia Exposed: No Fat Layer (Subcutaneous Tissue) Exposed: No Tendon Exposed: No Muscle Exposed: No Joint Exposed: No Bone Exposed: No Periwound Skin Texture Texture Color No Abnormalities Noted: Yes No Abnormalities Noted: Yes Moisture No Abnormalities Noted: No Dry / Scaly: Yes Maceration: No Treatment Notes Wound #1 (Lower Leg) Wound Laterality: Left, Medial Cleanser Peri-Wound Care Topical Primary Dressing Secondary Dressing Secured With Compression Wrap Compression  Stockings Add-Ons Electronic Signature(s) Signed: 08/20/2022 9:48:15 PM By: Blanche East RN Entered By: Blanche East on 08/20/2022 14:39:04 [name] ([id number]) [id number] Page 8 of 9 -------------------------------------------------------------------------------- Wound Assessment Details Patient Name: Date of Service: [name], [name] 08/20/2022 1:45 PM Medical Record Number: [id number] Patient Account Number: [id number] Date of Birth/Sex: Treating RN: 1945-12-12 (76 y.o. [name] Primary Care Aquiles Ruffini: [name] Other Clinician: Referring Flora Ratz: Treating Abriella Filkins/Extender: Pixie Casino Weeks in Treatment: 3 Wound  Status Wound Number: 2 Primary Venous Leg Ulcer Etiology: Wound Location: Left, Posterior Lower Leg Wound Open Wounding Event: Blister Status: Date Acquired: 07/06/2022 Comorbid Cataracts, Asthma, Hypertension, Neuropathy, Received Weeks Of Treatment: 3 History: Chemotherapy, Received Radiation Clustered Wound: No Photos Wound Measurements Length: (cm) 4.5 Width: (cm) 2.5 Depth: (cm) 0.1 Area: (cm) 8.836 Volume: (cm) 0.884 % Reduction in Area: -28.6% % Reduction in Volume: -28.7% Wound Description Classification: Full Thickness Without Exposed Su Exudate Amount: Medium Exudate Type: Serosanguineous Exudate Color: red, brown pport Structures Wound Bed Granulation Amount: Medium (34-66%) Exposed Structure Granulation Quality: Red Fascia Exposed: No Necrotic Amount: Medium (34-66%) Fat Layer (Subcutaneous Tissue) Exposed: Yes Necrotic Quality: Adherent Slough Tendon Exposed: No Muscle Exposed: No Joint Exposed: No Bone Exposed: No Periwound Skin Texture Texture Color No Abnormalities Noted: No No Abnormalities Noted: No Moisture Temperature / Pain No Abnormalities Noted: No Tenderness on Palpation: Yes Maceration: No Treatment Notes Wound #2 (Lower Leg) Wound Laterality:  Left, Posterior Cleanser Soap and Water Discharge Instruction: May shower and wash wound with dial antibacterial soap and water prior to dressing change. Peri-Wound Care Sween Lotion (Moisturizing lotion) Discharge Instruction: Apply moisturizing lotion as directed [name], [name] ([id number]) [id number] Page 9 of 9 Topical Triamcinolone Discharge Instruction: Apply Triamcinolone as directed Primary Dressing Hydrofera Blue Ready Foam, 4x5 in Discharge Instruction: Apply to wound bed as instructed Santyl Ointment Discharge Instruction: Apply to larger posterior. wound only Secondary Dressing Zetuvit Plus Silicone Border Dressing 4x4 (in/in) Discharge Instruction: Apply silicone border over primary dressing as directed. Secured With [employer] Surgical T 2x10 (in/yd) ape Discharge Instruction: Secure with tape as directed. Compression Wrap ThreePress (3 layer compression wrap) Discharge Instruction: Apply three layer compression as directed. Compression Stockings Add-Ons Electronic Signature(s) Signed: 08/20/2022 2:31:58 PM By: Adline Peals Signed: 08/20/2022 9:48:15 PM By: Blanche East RN Entered By: Adline Peals on 08/20/2022 14:29:43 -------------------------------------------------------------------------------- Vitals Details Patient Name: Date of Service: [name], [name] 08/20/2022 1:45 PM Medical Record Number: [id number] Patient Account Number: [id number] Date of Birth/Sex: Treating RN: 07-12-1946 (76 y.o. F) Primary Care Natasha Paulson: [name] Other Clinician: Referring Cythnia Osmun: Treating Avondre Richens/Extender: [name] Session in Treatment: 3 Vital Signs Time Taken: 02:09 Temperature (F): 98.2 Height (in): 63 Pulse (bpm): 120 Weight (lbs): 200 Respiratory Rate (breaths/min): 20 Body Mass Index (BMI): 35.4 Blood Pressure (mmHg): 158/85 Reference Range: 80 - 120 mg /  dl Notes rechecked HR manually 98bpm Electronic Signature(s) Signed: 08/20/2022 9:48:15 PM By: Blanche East RN Entered By: Blanche East on 08/20/2022 15:00:56

## 2022-08-27 ENCOUNTER — Encounter (HOSPITAL_BASED_OUTPATIENT_CLINIC_OR_DEPARTMENT_OTHER): Payer: Medicare Other | Admitting: General Surgery

## 2022-08-27 DIAGNOSIS — I83893 Varicose veins of bilateral lower extremities with other complications: Secondary | ICD-10-CM | POA: Diagnosis not present

## 2022-08-27 DIAGNOSIS — R600 Localized edema: Secondary | ICD-10-CM | POA: Diagnosis not present

## 2022-08-27 DIAGNOSIS — I872 Venous insufficiency (chronic) (peripheral): Secondary | ICD-10-CM | POA: Diagnosis not present

## 2022-08-27 DIAGNOSIS — L97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d: Secondary | ICD-10-CM | POA: Diagnosis not present

## 2022-08-27 DIAGNOSIS — G629 Polyneuropathy, unspecified: Secondary | ICD-10-CM | POA: Diagnosis not present

## 2022-08-27 DIAGNOSIS — Z87891 Personal history of nicotine dependence: Secondary | ICD-10-CM | POA: Diagnosis not present

## 2022-08-27 DIAGNOSIS — L97222 Non-pressure chronic ulcer of left calf with fat layer exposed: Secondary | ICD-10-CM | POA: Diagnosis not present

## 2022-08-29 ENCOUNTER — Ambulatory Visit: Payer: Medicare Other | Admitting: Internal Medicine

## 2022-09-03 ENCOUNTER — Encounter (HOSPITAL_BASED_OUTPATIENT_CLINIC_OR_DEPARTMENT_OTHER): Payer: Medicare Other | Admitting: General Surgery

## 2022-09-03 DIAGNOSIS — Z87891 Personal history of nicotine dependence: Secondary | ICD-10-CM | POA: Diagnosis not present

## 2022-09-03 DIAGNOSIS — L97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d: Secondary | ICD-10-CM | POA: Diagnosis not present

## 2022-09-03 DIAGNOSIS — R600 Localized edema: Secondary | ICD-10-CM | POA: Diagnosis not present

## 2022-09-03 DIAGNOSIS — G629 Polyneuropathy, unspecified: Secondary | ICD-10-CM | POA: Diagnosis not present

## 2022-09-03 DIAGNOSIS — L97222 Non-pressure chronic ulcer of left calf with fat layer exposed: Secondary | ICD-10-CM | POA: Diagnosis not present

## 2022-09-03 DIAGNOSIS — I83893 Varicose veins of bilateral lower extremities with other complications: Secondary | ICD-10-CM | POA: Diagnosis not present

## 2022-09-03 DIAGNOSIS — I872 Venous insufficiency (chronic) (peripheral): Secondary | ICD-10-CM | POA: Diagnosis not present

## 2022-09-03 NOTE — Progress Notes (Signed)
CAMYAH, [name] ([id number]) [id number] Page 1 of 9 Visit Report for 09/03/2022 Chief Complaint Document Details Patient Name: Date of Service: [name], [name] 09/03/2022 1:45 PM Medical Record Number: [id number] Patient Account Number: [id number] Date of Birth/Sex: Treating RN: 04/10/1946 (76 y.o. F) Primary Care Provider: [name] Other Clinician: Referring Provider: Treating Provider/Extender: [name] Session in Treatment: 5 Information Obtained from: Patient Chief Complaint Patient presents for treatment of an open ulcer due to venous insufficiency Electronic Signature(s) Signed: 09/03/2022 2:47:36 PM By: Fredirick Maudlin MD FACS Entered By: Fredirick Maudlin on 09/03/2022 14:47:36 -------------------------------------------------------------------------------- HPI Details Patient Name: Date of Service: [name], [name] 09/03/2022 1:45 PM Medical Record Number: [id number] Patient Account Number: [id number] Date of Birth/Sex: Treating RN: [birth year] (76 y.o. F) Primary Care Provider: [name] Other Clinician: Referring Provider: Treating Provider/Extender: [name] Session in Treatment: 5 History of Present Illness HPI Description: ADMISSION This is a 76 year old woman with a history of chronic venous insufficiency status post saphenous vein ablations in 2010 and 2016. 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 She presents today with an open venous ulcer on her left lower leg. It has been present for a little over 2 weeks. She saw her primary care provider who apparently swabbed the wound and grew out Pseudomonas. She just completed a course of ciprofloxacin for this. ABI was 0.91. She reports that she has had previous issues with ulcers in this same location, stemming back to a punch biopsy taken by a dermatologist many years ago. She has had several skin  substitutes applied to the area that have ultimately resulted in healing on prior occasions. She has 2 small ulcers on her left medial lower leg. There is slough accumulation in both of them. The more anterior of the 2 is quite small and has some soft slough on the surface, underneath which there is good granulation tissue. The more medial wound also has slough accumulation, but the underlying surface is fairly fibrotic and gritty. This is consistent with her provided history of multiple ulcers in the same location. 08/03/2022: The anterior wound is smaller today with just a little bit of slough accumulation. The more medial wound continues to be very sensitive and fibrotic with slough buildup. 08/13/2022: The anterior wound is closed. The more medial wound remains sensitive with a fairly fibrotic surface. There is more granulation tissue filling in, however, and there is less slough than on prior occasions. 08/20/2022: The anterior wound remains closed. The more medial wound has filled with granulation tissue but still has a fair amount of slough on the surface and remains fairly tender. Unfortunately, she has developed 2 small ulcers just proximal to this. The fat layer is exposed in each. She says that over the weekend, she felt a burning sensation in that location. 08/27/2022: The 2 small wounds proximal to the main wound have merged into a single site. The wounds look a little bit dry, but they are quite clean without significant slough accumulation. They remain quite tender. 09/03/2022: All of the wounds have now merged into 1 large wound. There is a strong odor coming from the wound and the surface is not particularly viable. It is extremely painful for her today. Electronic Signature(s) Signed: 09/03/2022 2:48:14 PM By: Fredirick Maudlin MD [place], [name] ([id number]) [id number] Page 2 of 9 Signed: 09/03/2022 2:48:14 PM By: Fredirick Maudlin MD FACS Entered  By: Fredirick Maudlin on 09/03/2022 14:48:14 -------------------------------------------------------------------------------- Physical Exam Details Patient Name: Date  of Service: [name], [name] 09/03/2022 1:45 PM Medical Record Number: [id number] Patient Account Number: [id number] Date of Birth/Sex: Treating RN: February 28, 1946 (76 y.o. F) Primary Care Provider: [name] Other Clinician: Referring Provider: Treating Provider/Extender: Pixie Casino Weeks in Treatment: 5 Constitutional . . . . No acute distress.Marland Kitchen Respiratory Normal work of breathing on room air.. Notes 09/03/2022: All of the wounds have now merged into 1 large wound. There is a strong odor coming from the wound and the surface is not particularly viable. It is extremely painful for her today. Electronic Signature(s) Signed: 09/03/2022 2:50:08 PM By: Fredirick Maudlin MD FACS Entered By: Fredirick Maudlin on 09/03/2022 14:50:08 -------------------------------------------------------------------------------- Physician Orders Details Patient Name: Date of Service: ARDIE, DRAGOO 09/03/2022 1:45 PM Medical Record Number: [id number] Patient Account Number: [id number] Date of Birth/Sex: Treating RN: 09-24-1946 (76 y.o. [name] Primary Care Provider: [name] Other Clinician: Referring Provider: Treating Provider/Extender: [name] Session in Treatment: 5 Verbal / Phone Orders: No Diagnosis Coding ICD-10 Coding Code Description [phone] Non-pressure chronic ulcer of other part of left lower leg with fat layer exposed I83.893 Varicose veins of bilateral lower extremities with other complications Q59.5 Venous insufficiency (chronic) (peripheral) G62.9 Polyneuropathy, unspecified R60.0 Localized edema Follow-up Appointments ppointment in 1 week. - Dr. Celine Ahr RM 4 Return A Anesthetic Wound #2 Left,Posterior Lower Leg (In clinic) Topical Lidocaine 5%  applied to wound bed - in clinic, prior to debridement Bathing/ Shower/ Hygiene May shower with protection but do not get wound dressing(s) wet. Edema Control - Lymphedema / SCD / Other [name], MAIERS ([id number]) [id number] Page 3 of 9 Left Lower Extremity Avoid standing for long periods of time. Patient to wear own compression stockings every day. - Right lower extremity Moisturize legs daily. Wound Treatment Wound #2 - Lower Leg Wound Laterality: Left, Posterior Cleanser: Soap and Water 1 x Per Week/30 Days Discharge Instructions: May shower and wash wound with dial antibacterial soap and water prior to dressing change. Peri-Wound Care: Sween Lotion (Moisturizing lotion) 1 x Per Week/30 Days Discharge Instructions: Apply moisturizing lotion as directed Topical: Mupirocin Ointment 1 x Per Week/30 Days Discharge Instructions: Apply Mupirocin (Bactroban) as instructed Prim Dressing: Hydrofera Blue Ready Foam, 4x5 in 1 x Per Week/30 Days ary Discharge Instructions: Apply to wound bed as instructed Secondary Dressing: Zetuvit Plus Silicone Border Dressing 4x4 (in/in) 1 x Per Week/30 Days Discharge Instructions: Apply silicone border over primary dressing as directed. Secured With: 67M Medipore Public affairs consultant Surgical T 2x10 (in/yd) 1 x Per Week/30 Days ape Discharge Instructions: Secure with tape as directed. Compression Wrap: ThreePress (3 layer compression wrap) 1 x Per Week/30 Days Discharge Instructions: Apply three layer compression as directed. Laboratory naerobe culture (MICRO) - Culture Left Posterior lower leg - (ICD10 [phone] - Non-pressure Bacteria identified in Unspecified specimen by A chronic ulcer of other part of left lower leg with fat layer exposed) LOINC Code: 416-6 Convenience Name: Anaerobic culture Patient Medications llergies: sulfa antibiotics, Voltaren, doxycycline, Neoprene, adhesive, latex A Notifications Medication Indication Start  End 09/03/2022 amoxicillin-pot clavulanate DOSE oral 875 mg-125 mg tablet - 1 tab p.o. twice daily x10 days Electronic Signature(s) Signed: 09/03/2022 2:58:10 PM By: Fredirick Maudlin MD FACS Previous Signature: 09/03/2022 2:53:35 PM Version By: Fredirick Maudlin MD FACS Entered By: Fredirick Maudlin on 09/03/2022 14:54:39 Prescription 09/03/2022 -------------------------------------------------------------------------------- [name]. Fredirick Maudlin MD Patient Name: Provider: Mar 12, 1946 [id number] Date of Birth: NPI#: [id number] Sex: DEA #: [phone] Phone #: License #: Tillie Rung  [place] Patient Address: [place], [place] [address] [place], [place] [address] [phone] Allergies sulfa antibiotics; Voltaren; doxycycline; Neoprene; adhesive; latex RUWEYDA, MACKNIGHT ([id number]) [id number] Page 4 of 9 Provider's Orders naerobe culture - ICD10: L97.822 - Culture Left Posterior lower leg Bacteria identified in Unspecified specimen by A LOINC Code: 195-0 Convenience Name: Anaerobic culture Hand Signature: Date(s): Electronic Signature(s) Signed: 09/03/2022 2:58:10 PM By: Fredirick Maudlin MD FACS Entered By: Fredirick Maudlin on 09/03/2022 14:54:39 -------------------------------------------------------------------------------- Problem List Details Patient Name: Date of Service: JESILYN, EASOM 09/03/2022 1:45 PM Medical Record Number: [id number] Patient Account Number: [id number] Date of Birth/Sex: Treating RN: 1945-12-08 (76 y.o. 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 exposed  07/27/2022 No Yes I83.893 Varicose veins of bilateral lower extremities with other complications 3/82/5053 No Yes I87.2 Venous insufficiency (chronic) (peripheral) 07/27/2022 No Yes G62.9 Polyneuropathy, unspecified 07/27/2022 No Yes R60.0 Localized edema 07/27/2022 No Yes Inactive Problems Resolved Problems Electronic Signature(s) Signed: 09/03/2022 2:47:18 PM By: Fredirick Maudlin MD FACS Entered By: Fredirick Maudlin on 09/03/2022 14:47:18 [place], [name] ([id number]) [id number] Page 5 of 9 -------------------------------------------------------------------------------- Progress Note Details Patient Name: Date of Service: [name], [name] 09/03/2022 1:45 PM Medical Record Number: [id number] Patient Account Number: [id number] Date of Birth/Sex: Treating RN: [birth year] (76 y.o. F) Primary Care Provider: [name] Other Clinician: Referring Provider: Treating Provider/Extender: [name] Session in Treatment: 5 Subjective Chief Complaint Information obtained from Patient Patient presents for treatment of an open ulcer due to venous insufficiency History of Present Illness (HPI) ADMISSION This is a 76 year old woman with a history of chronic venous insufficiency status post saphenous vein ablations in 2010 and 2016. 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 She presents today with an open venous ulcer on her left lower leg. It has been present for a little over 2 weeks. She saw her primary care provider who apparently swabbed the wound and grew out Pseudomonas. She just completed a course of ciprofloxacin for this. ABI was 0.91. She reports that she has had previous issues with ulcers in this same location, stemming back to a punch biopsy taken by a dermatologist many years ago. She has had several skin substitutes applied to the area that have ultimately resulted in healing on prior occasions. She has 2  small ulcers on her left medial lower leg. There is slough accumulation in both of them. The more anterior of the 2 is quite small and has some soft slough on the surface, underneath which there is good granulation tissue. The more medial wound also has slough accumulation, but the underlying surface is fairly fibrotic and gritty. This is consistent with her provided history of multiple ulcers in the same location. 08/03/2022: The anterior wound is smaller today with just a little bit of slough accumulation. The more medial wound continues to be very sensitive and fibrotic with slough buildup. 08/13/2022: The anterior wound is closed. The more medial wound remains sensitive with a fairly fibrotic surface. There is more granulation tissue filling in, however, and there is less slough than on prior occasions. 08/20/2022: The anterior wound remains closed. The more medial wound has filled with granulation tissue but still has a fair amount of slough  on the surface and remains fairly tender. Unfortunately, she has developed 2 small ulcers just proximal to this. The fat layer is exposed in each. She says that over the weekend, she felt a burning sensation in that location. 08/27/2022: The 2 small wounds proximal to the main wound have merged into a single site. The wounds look a little bit dry, but they are quite clean without significant slough accumulation. They remain quite tender. 09/03/2022: All of the wounds have now merged into 1 large wound. There is a strong odor coming from the wound and the surface is not particularly viable. It is extremely painful for her today. Patient History Information obtained from Patient. Family History Cancer - Mother,Paternal Grandparents, Diabetes - Siblings, Heart Disease - Father, Hypertension - Father. Social History Former smoker - quit in 1967. Medical History Eyes Patient has history of Cataracts - L eye Ear/Nose/Mouth/Throat Denies history of Chronic  sinus problems/congestion, Middle ear problems Hematologic/Lymphatic Denies history of Anemia, Human Immunodeficiency Virus, Lymphedema Respiratory Patient has history of Asthma Cardiovascular Patient has history of Hypertension Endocrine Denies history of Type I Diabetes, Type II Diabetes Neurologic Patient has history of Neuropathy - Feet and finger Denies history of Seizure Disorder Oncologic Patient has history of Received Chemotherapy - 2012, Received Radiation - 2012 Hospitalization/Surgery History - Lower extremity venography 2020. - Intravascular ultrasound. - peripheral vascular intervention. - melanoma 2011. Medical A Surgical History Notes nd Respiratory OSA on CPAP Cardiovascular Cronic venous insufficiency 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 Gastrointestinal liver cyst Musculoskeletal arthritis Neurologic restless leg syndrome CAMY, LEDER ([id number]) [id number] Page 6 of 9 Objective Constitutional No acute distress.. Vitals Time Taken: 1:50 PM, Height: 63 in, Weight: 200 lbs, BMI: 35.4, Temperature: 98.5 F, Pulse: 79 bpm, Respiratory Rate: 20 breaths/min, Blood Pressure: 128/75 mmHg. Respiratory Normal work of breathing on room air.. General Notes: 09/03/2022: All of the wounds have now merged into 1 large wound. There is a strong odor coming from the wound and the surface is not particularly viable. It is extremely painful for her today. Integumentary (Hair, Skin) Wound #2 status is Open. Original cause of wound was Blister. The date acquired was: 07/06/2022. The wound has been in treatment 5 weeks. The wound is located on the Left,Posterior Lower Leg. The wound measures 7cm length x 3.5cm width x 0.2cm depth; 19.242cm^2 area and 3.848cm^3 volume. There is Fat Layer (Subcutaneous Tissue) exposed. There is no tunneling or undermining noted. There is a medium amount of serosanguineous drainage noted. There  is small (1-33%) red, friable, hyper - granulation within the wound bed. There is a large (67-100%) amount of necrotic tissue within the wound bed including Eschar and Adherent Slough. The periwound skin appearance did not exhibit: Maceration. The periwound has tenderness on palpation. Assessment Active Problems ICD-10 Non-pressure chronic ulcer of other part of left lower leg with fat layer exposed Varicose veins of bilateral lower extremities with other complications Venous insufficiency (chronic) (peripheral) Polyneuropathy, unspecified Localized edema Procedures Wound #2 Pre-procedure diagnosis of Wound #2 is a Venous Leg Ulcer located on the Left,Posterior Lower Leg . There was a Three Layer Compression Therapy Procedure by Dellie Catholic, RN. Post procedure Diagnosis Wound #2: Same as Pre-Procedure Plan Follow-up Appointments: Return Appointment in 1 week. - Dr. Celine Ahr RM 4 Anesthetic: Wound #2 Left,Posterior Lower Leg: (In clinic) Topical Lidocaine 5% applied to wound bed - in clinic, prior to debridement Bathing/ Shower/ Hygiene: May shower with protection but do not get wound dressing(s) wet. Edema  Control - Lymphedema / SCD / Other: Avoid standing for long periods of time. Patient to wear own compression stockings every day. - Right lower extremity Moisturize legs daily. Laboratory ordered were: Anaerobic culture - Culture Left Posterior lower leg The following medication(s) was prescribed: amoxicillin-pot clavulanate oral 875 mg-125 mg tablet 1 tab p.o. twice daily x10 days starting 09/03/2022 WOUND #2: - Lower Leg Wound Laterality: Left, Posterior Cleanser: Soap and Water 1 x Per Week/30 Days Discharge Instructions: May shower and wash wound with dial antibacterial soap and water prior to dressing change. Peri-Wound Care: Sween Lotion (Moisturizing lotion) 1 x Per Week/30 Days Discharge Instructions: Apply moisturizing lotion as directed Topical: Mupirocin Ointment 1  x Per Week/30 Days Discharge Instructions: Apply Mupirocin (Bactroban) as instructed Prim Dressing: Hydrofera Blue Ready Foam, 4x5 in 1 x Per Week/30 Days ary Discharge Instructions: Apply to wound bed as instructed GREER, WAINRIGHT ([id number]) [id number] Page 7 of 9 Secondary Dressing: Zetuvit Plus Silicone Border Dressing 4x4 (in/in) 1 x Per Week/30 Days Discharge Instructions: Apply silicone border over primary dressing as directed. Secured With: 74M Medipore Public affairs consultant Surgical T 2x10 (in/yd) 1 x Per Week/30 Days ape Discharge Instructions: Secure with tape as directed. Compression Wrap: ThreePress (3 layer compression wrap) 1 x Per Week/30 Days Discharge Instructions: Apply three layer compression as directed. 09/03/2022: All of the wounds have now merged into 1 large wound. There is a strong odor coming from the wound and the surface is not particularly viable. It is extremely painful for her today. I attempted to debride the wound but was only able to make a couple of passes with a curette before the patient asked that I stop, secondary to pain. Given the change in appearance, size, odor, and degree of pain, I am quite certain the wound is infected. I took a culture and have empirically prescribed Augmentin. The topical gentamicin has not been working, so we will change this to mupirocin. Unfortunately she is allergic to both sulfa and doxycycline; once I have her culture data back, we will make appropriate adjustments in her antibiotic therapy. Continue Hydrofera Blue and 3 layer compression. Follow-up in 1 week. Electronic Signature(s) Signed: 09/03/2022 4:57:05 PM By: Dellie Catholic RN Signed: 09/04/2022 11:15:04 AM By: Fredirick Maudlin MD FACS Previous Signature: 09/03/2022 2:57:00 PM Version By: Fredirick Maudlin MD FACS Entered By: Dellie Catholic on 09/03/2022  16:53:43 -------------------------------------------------------------------------------- HxROS Details Patient Name: Date of Service: [name], [name] 09/03/2022 1:45 PM Medical Record Number: [id number] Patient Account Number: [id number] Date of Birth/Sex: Treating RN: 1946/03/06 (76 y.o. F) Primary Care Provider: [name] Other Clinician: Referring Provider: Treating Provider/Extender: [name] Session in Treatment: 5 Information Obtained From Patient Eyes Medical History: Positive for: Cataracts - L eye Ear/Nose/Mouth/Throat Medical History: Negative for: Chronic sinus problems/congestion; Middle ear problems Hematologic/Lymphatic Medical History: Negative for: Anemia; Human Immunodeficiency Virus; Lymphedema Respiratory Medical History: Positive for: Asthma Past Medical History Notes: OSA on CPAP Cardiovascular Medical History: Positive for: Hypertension Past Medical History Notes: Cronic venous insufficiency 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 Gastrointestinal Medical History: Past Medical History NotesASPASIA, [name] ([id number]) [id number] Page 8 of 9 liver cyst Endocrine Medical History: Negative for: Type I Diabetes; Type II Diabetes Musculoskeletal Medical History: Past Medical History Notes: arthritis Neurologic Medical History: Positive for: Neuropathy - Feet and finger Negative for: Seizure Disorder Past Medical History Notes: restless leg syndrome Oncologic Medical History: Positive for: Received Chemotherapy - 2012; Received Radiation - 2012 HBO Extended History  Items Eyes: Cataracts Immunizations Pneumococcal Vaccine: Received Pneumococcal Vaccination: Yes Received Pneumococcal Vaccination On or After 60th Birthday: Yes Implantable Devices No devices added Hospitalization / Surgery History Type of Hospitalization/Surgery Lower extremity venography  2020 Intravascular ultrasound peripheral vascular intervention melanoma 2011 Family and Social History Cancer: Yes - Mother,Paternal Grandparents; Diabetes: Yes - Siblings; Heart Disease: Yes - Father; Hypertension: Yes - Father; Former smoker - quit in [employer]) Signed: 09/03/2022 2:58:10 PM By: Fredirick Maudlin MD FACS Entered By: Fredirick Maudlin on 09/03/2022 14:49:39 -------------------------------------------------------------------------------- [place] Details Patient Name: Date of Service: [name], [name] 09/03/2022 Medical Record Number: [id number] Patient Account Number: [id number] Date of Birth/Sex: Treating RN: 11/12/1945 (76 y.o. F) Primary Care Provider: [name] Other Clinician: Referring Provider: Treating Provider/Extender: [name] Session in Treatment: 5 Diagnosis Coding ICD-10 Codes [name], [name] ([id number]) [id number] Page 9 of 9 Code Description [phone] Non-pressure chronic ulcer of other part of left lower leg with fat layer exposed I83.893 Varicose veins of bilateral lower extremities with other complications D62.2 Venous insufficiency (chronic) (peripheral) G62.9 Polyneuropathy, unspecified R60.0 Localized edema Facility Procedures : CPT4 Code: [id number] Description: (Facility Use Only) 29581LT - [facility] JHERDE LWR LT LEG Modifier: Quantity: 1 Physician Procedures : CPT4 Code Description Modifier [id number] - WC PHYS LEVEL 4 - EST PT ICD-10 Diagnosis Description L97.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d E56.314 Varicose veins of bilateral lower extremities with  other complications H70.2 Venous insufficiency (chronic) (peripheral) R60.0 Localized edema Quantity: 1 Electronic Signature(s) Signed: 09/03/2022 4:57:05 PM By: Dellie Catholic RN Signed: 09/04/2022 11:15:04 AM By: Fredirick Maudlin MD FACS Previous  Signature: 09/03/2022 2:57:54 PM Version By: Fredirick Maudlin MD FACS Entered By: Dellie Catholic on 09/03/2022 16:53:26

## 2022-09-03 NOTE — Progress Notes (Addendum)
[name] ([id number]) [id number] Page 1 of 7 Visit Report for 09/03/2022 Arrival Information Details Patient Name: Date of Service: [name], [name] 09/03/2022 1:45 PM Medical Record Number: [id number] Patient Account Number: [id number] Date of Birth/Sex: Treating RN: [birth year] (76 y.o. F) Primary Care Zoey Bidwell: [name] Other Clinician: Referring Jeannie Mallinger: Treating Larae Caison/Extender: [name] Session in Treatment: 5 Visit Information History Since Last Visit All ordered tests and consults were completed: No Patient Arrived: Ambulatory Added or deleted any medications: No Arrival Time: 13:54 Any new allergies or adverse reactions: No Transfer Assistance: None Had a fall or experienced change in No Patient Identification Verified: Yes activities of daily living that may affect Secondary Verification Process Completed: Yes risk of falls: Patient Requires Transmission-Based Precautions: No Signs or symptoms of abuse/neglect since last visito No Patient Has Alerts: No Hospitalized since last visit: No Implantable device outside of the clinic excluding No cellular tissue based products placed in the center since last visit: Pain Present Now: Yes Electronic Signature(s) Signed: 09/03/2022 2:05:40 PM By: Worthy Rancher Entered By: Worthy Rancher on 09/03/2022 13:55:16 -------------------------------------------------------------------------------- Compression Therapy Details Patient Name: Date of Service: [name] 09/03/2022 1:45 PM Medical Record Number: [id number] Patient Account Number: [id number] Date of Birth/Sex: Treating RN: 08-16-1946 (76 y.o. [name] Primary Care Catherine Cubero: [name] Other Clinician: Referring Javona [name]: Treating Shima Compere/Extender: [name] Session in Treatment: 5 Compression Therapy Performed for Wound Assessment: Wound #2 Left,Posterior Lower  Leg Performed By: Clinician Dellie Catholic, RN Compression Type: Three Layer Post Procedure Diagnosis Same as Pre-procedure Electronic Signature(s) Signed: 09/03/2022 4:57:05 PM By: Dellie Catholic RN Entered By: Dellie Catholic on 09/03/2022 16:52:37 Ebling, [name] ([id number]) [id number] Page 2 of 7 -------------------------------------------------------------------------------- Encounter Discharge Information Details Patient Name: Date of Service: [name] 09/03/2022 1:45 PM Medical Record Number: [id number] Patient Account Number: [id number] Date of Birth/Sex: Treating RN: 11-23-1945 (76 y.o. [name] Primary Care Katryn Plummer: [name] Other Clinician: Referring Ismar Yabut: Treating Eowyn Tabone/Extender: [name] Session in Treatment: 5 Encounter Discharge Information Items Discharge Condition: Stable Ambulatory Status: Cane Discharge Destination: Home Transportation: Private Auto Accompanied By: self Schedule Follow-up Appointment: Yes Clinical Summary of Care: Patient Declined Electronic Signature(s) Signed: 09/03/2022 4:57:05 PM By: Dellie Catholic RN Entered By: Dellie Catholic on 09/03/2022 16:54:09 -------------------------------------------------------------------------------- Lower Extremity Assessment Details Patient Name: Date of Service: [name] 09/03/2022 1:45 PM Medical Record Number: [id number] Patient Account Number: [id number] Date of Birth/Sex: Treating RN: 11-20-1945 (76 y.o. [name] Primary Care Emira Eubanks: [name] Other Clinician: Referring Macklen Wilhoite: Treating Samanvi Cuccia/Extender: Pixie Casino Weeks in Treatment: 5 Edema Assessment Assessed: [Left: No] [Right: No] [Left: Edema] [Right: :] Calf Left: Right: Point of Measurement: From Medial Instep 41 cm Ankle Left: Right: Point of Measurement: From Medial Instep 23.5 cm Vascular  Assessment Pulses: Dorsalis Pedis Palpable: [Left:Yes] Electronic Signature(s) Signed: 09/03/2022 4:57:05 PM By: Dellie Catholic RN Entered By: Dellie Catholic on 09/03/2022 14:07:28 Multi Wound Chart Details -------------------------------------------------------------------------------- [name] ([id number]) [id number] Page 3 of 7 Patient Name: Date of Service: [name] 09/03/2022 1:45 PM Medical Record Number: [id number] Patient Account Number: [id number] Date of Birth/Sex: Treating RN: 1946/05/10 (76 y.o. F) Primary Care Lurline Caver: [name] Other Clinician: Referring Daven Pinckney: Treating [place] Segler/Extender: [name] Session in Treatment: 5 Vital Signs Height(in): 63 Pulse(bpm): 18 Weight(lbs): 200 Blood Pressure(mmHg): 128/75 Body Mass Index(BMI): 35.4 Temperature(F): 98.5 Respiratory Rate(breaths/min): 20 Wound Assessments Wound Number: 2 N/A N/A Photos: N/A N/A Left, Posterior  Lower Leg N/A N/A Wound Location: Blister N/A N/A Wounding Event: Venous Leg Ulcer N/A N/A Primary Etiology: Cataracts, Asthma, Hypertension, N/A N/A Comorbid History: Neuropathy, Received Chemotherapy, Received Radiation 07/06/2022 N/A N/A Date Acquired: 5 N/A N/A Weeks of Treatment: Open N/A N/A Wound Status: No N/A N/A Wound Recurrence: 7x3.5x0.2 N/A N/A Measurements L x W x D (cm) 19.242 N/A N/A A (cm) : rea 3.848 N/A N/A Volume (cm) : -180.00% N/A N/A % Reduction in Area: -460.10% N/A N/A % Reduction in Volume: Full Thickness Without Exposed N/A N/A Classification: Support Structures Medium N/A N/A Exudate A mount: Serosanguineous N/A N/A Exudate Type: red, brown N/A N/A Exudate Color: Small (1-33%) N/A N/A Granulation Amount: Red, Hyper-granulation, Friable N/A N/A Granulation Quality: Large (67-100%) N/A N/A Necrotic Amount: Eschar, Adherent Slough N/A N/A Necrotic Tissue: Fat Layer  (Subcutaneous Tissue): Yes N/A N/A Exposed Structures: Fascia: No Tendon: No Muscle: No Joint: No Bone: No None N/A N/A Epithelialization: Maceration: No N/A N/A Periwound Skin Moisture: Yes N/A N/A Tenderness on Palpation: Treatment Notes Electronic Signature(s) Signed: 09/03/2022 2:47:30 PM By: Fredirick Maudlin MD FACS Entered By: Fredirick Maudlin on 09/03/2022 14:47:30 -------------------------------------------------------------------------------- Multi-Disciplinary Care Plan Details Patient Name: Date of Service: DAKOTA, STANGL 09/03/2022 1:45 PM Medical Record Number: [id number] Patient Account Number: [id number] BAYLEN, DEA ([id number]) [id number] Page 4 of 7 Date of Birth/Sex: Treating RN: 12/16/1945 (76 y.o. [name] Primary Care Collier Monica: Other Clinician: [name] Referring Aniceto [name]: Treating [name] Hippert/Extender: [name] Session in Treatment: 5 Active Inactive Wound/Skin Impairment Nursing Diagnoses: Impaired tissue integrity Goals: Ulcer/skin breakdown will have a volume reduction of 30% by week 4 Date Initiated: 08/03/2022 Target Resolution Date: 10/01/2022 Goal Status: Active Interventions: Assess ulceration(s) every visit Treatment Activities: Skin care regimen initiated : 08/03/2022 Notes: Electronic Signature(s) Signed: 09/03/2022 4:57:05 PM By: Dellie Catholic RN Entered By: Dellie Catholic on 09/03/2022 14:11:50 -------------------------------------------------------------------------------- Pain Assessment Details Patient Name: Date of Service: MADISEN, LUDVIGSEN 09/03/2022 1:45 PM Medical Record Number: [id number] Patient Account Number: [id number] Date of Birth/Sex: Treating RN: [birth year] (76 y.o. F) Primary Care Shannelle Alguire: [name] Other Clinician: Referring Philomene Haff: Treating Zakya Halabi/Extender: [name] Session in Treatment: 5 Active  Problems Location of Pain Severity and Description of Pain Patient Has Paino Yes Site Locations Rate the pain. Current Pain Level: 2 Worst Pain Level: 10 Least Pain Level: 0 Tolerable Pain Level: 4 Character of Pain Describe the Pain: Burning Pain Management and Medication Current Pain Management: KARALEE, HAUTER ([id number]) [id number] Page 5 of 7 Electronic Signature(s) Signed: 09/03/2022 2:05:40 PM By: Worthy Rancher Entered By: Worthy Rancher on 09/03/2022 13:56:08 -------------------------------------------------------------------------------- Patient/Caregiver Education Details Patient Name: Date of Service: SHAKILA, MAK 10/30/2023andnbsp1:45 PM Medical Record Number: [id number] Patient Account Number: [id number] Date of Birth/Gender: Treating RN: 19-May-1946 (76 y.o. [name] Primary Care Physician: [name] Other Clinician: Referring Physician: Treating Physician/Extender: [name] Session in Treatment: 5 Education Assessment Education Provided To: Patient Education Topics Provided Wound/Skin Impairment: Methods: Explain/Verbal Responses: State content correctly Electronic Signature(s) Signed: 09/03/2022 4:57:05 PM By: Dellie Catholic RN Entered By: Dellie Catholic on 09/03/2022 14:09:19 -------------------------------------------------------------------------------- Wound Assessment Details Patient Name: Date of Service: ROSMARY, DIONISIO 09/03/2022 1:45 PM Medical Record Number: [id number] Patient Account Number: [id number] Date of Birth/Sex: Treating RN: 09/09/1946 (76 y.o. F) Primary Care Christelle Igoe: [name] Other Clinician: Referring Letzy Gullickson: Treating Jerik Falletta/Extender: Pixie Casino Weeks in Treatment: 5 Wound Status Wound Number: 2 Primary Venous Leg Ulcer Etiology: Wound Location: Left, Posterior  Lower Leg Wound Open Wounding Event: Blister Status: Date  Acquired: 07/06/2022 Comorbid Cataracts, Asthma, Hypertension, Neuropathy, Received Weeks Of Treatment: 5 History: Chemotherapy, Received Radiation Clustered Wound: No Photos DESHANAE, LINDO ([id number]) [id number] Page 6 of 7 Wound Measurements Length: (cm) 7 Width: (cm) 3.5 Depth: (cm) 0.2 Area: (cm) 19.242 Volume: (cm) 3.848 % Reduction in Area: -180% % Reduction in Volume: -460.1% Epithelialization: None Tunneling: No Undermining: No Wound Description Classification: Full Thickness Without Exposed Su Exudate Amount: Medium Exudate Type: Serosanguineous Exudate Color: red, brown pport Structures Wound Bed Granulation Amount: Small (1-33%) Exposed Structure Granulation Quality: Red, Hyper-granulation, Friable Fascia Exposed: No Necrotic Amount: Large (67-100%) Fat Layer (Subcutaneous Tissue) Exposed: Yes Necrotic Quality: Eschar, Adherent Slough Tendon Exposed: No Muscle Exposed: No Joint Exposed: No Bone Exposed: No Periwound Skin Texture Texture Color No Abnormalities Noted: No No Abnormalities Noted: No Moisture Temperature / Pain No Abnormalities Noted: No Tenderness on Palpation: Yes Maceration: No Treatment Notes Wound #2 (Lower Leg) Wound Laterality: Left, Posterior Cleanser Soap and Water Discharge Instruction: May shower and wash wound with dial antibacterial soap and water prior to dressing change. Peri-Wound Care Sween Lotion (Moisturizing lotion) Discharge Instruction: Apply moisturizing lotion as directed Topical Mupirocin Ointment Discharge Instruction: Apply Mupirocin (Bactroban) as instructed Primary Dressing Hydrofera Blue Ready Foam, 4x5 in Discharge Instruction: Apply to wound bed as instructed Secondary Dressing Zetuvit Plus Silicone Border Dressing 4x4 (in/in) Discharge Instruction: Apply silicone border over primary dressing as directed. Secured With [employer] Surgical T 2x10  (in/yd) ape Discharge Instruction: Secure with tape as directed. Compression Wrap ThreePress (3 layer compression wrap) Discharge Instruction: Apply three layer compression as directed. JELITZA, MANNINEN ([id number]) [id number] Page 7 of 7 Compression Stockings Add-Ons Electronic Signature(s) Signed: 09/03/2022 4:57:05 PM By: Dellie Catholic RN Previous Signature: 09/03/2022 2:05:40 PM Version By: Worthy Rancher Entered By: Dellie Catholic on 09/03/2022 14:10:08 -------------------------------------------------------------------------------- Vitals Details Patient Name: Date of Service: SAUDIA, SMYSER 09/03/2022 1:45 PM Medical Record Number: [id number] Patient Account Number: [id number] Date of Birth/Sex: Treating RN: Dec 24, 1945 (76 y.o. F) Primary Care Khaliq Turay: [name] Other Clinician: Referring Miamarie Moll: Treating Charlet Harr/Extender: [name] Session in Treatment: 5 Vital Signs Time Taken: 13:50 Temperature (F): 98.5 Height (in): 63 Pulse (bpm): 79 Weight (lbs): 200 Respiratory Rate (breaths/min): 20 Body Mass Index (BMI): 35.4 Blood Pressure (mmHg): 128/75 Reference Range: 80 - 120 mg / dl Electronic Signature(s) Signed: 09/03/2022 2:05:40 PM By: Worthy Rancher Entered By: Worthy Rancher on 09/03/2022 13:55:42

## 2022-09-10 ENCOUNTER — Encounter (HOSPITAL_BASED_OUTPATIENT_CLINIC_OR_DEPARTMENT_OTHER): Payer: Medicare Other | Attending: General Surgery | Admitting: General Surgery

## 2022-09-10 DIAGNOSIS — L97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d: Secondary | ICD-10-CM | POA: Diagnosis not present

## 2022-09-10 DIAGNOSIS — G629 Polyneuropathy, unspecified: Secondary | ICD-10-CM | POA: Insufficient documentation

## 2022-09-10 DIAGNOSIS — I872 Venous insufficiency (chronic) (peripheral): Secondary | ICD-10-CM | POA: Insufficient documentation

## 2022-09-10 DIAGNOSIS — I83028 Varicose veins of left lower extremity with ulcer other part of lower leg: Secondary | ICD-10-CM | POA: Diagnosis not present

## 2022-09-10 DIAGNOSIS — I83891 Varicose veins of right lower extremities with other complications: Secondary | ICD-10-CM | POA: Diagnosis not present

## 2022-09-10 DIAGNOSIS — L97222 Non-pressure chronic ulcer of left calf with fat layer exposed: Secondary | ICD-10-CM | POA: Diagnosis not present

## 2022-09-10 DIAGNOSIS — R600 Localized edema: Secondary | ICD-10-CM | POA: Diagnosis not present

## 2022-09-10 NOTE — Progress Notes (Signed)
[name], [name] ([id number]) [id number] Page 1 of 8 Visit Report for 09/10/2022 Arrival Information Details Patient Name: Date of Service: [name] 09/10/2022 1:45 PM Medical Record Number: [id number] Patient Account Number: [id number] Date of Birth/Sex: Treating RN: 03/20/1946 (76 y.o. [name], [name] Primary Care Michella Detjen: [name] Other Clinician: Referring Dazaria Macneill: Treating Anihya Tuma/Extender: [name] Session in Treatment: 6 Visit Information History Since Last Visit Added or deleted any medications: No Patient Arrived: Ambulatory Any new allergies or adverse reactions: No Arrival Time: 13:26 Had a fall or experienced change in No Accompanied By: self activities of daily living that may affect Transfer Assistance: None risk of falls: Patient Requires Transmission-Based Precautions: No Signs or symptoms of abuse/neglect since last visito No Patient Has Alerts: No Hospitalized since last visit: No Implantable device outside of the clinic excluding No cellular tissue based products placed in the center since last visit: Has Compression in Place as Prescribed: Yes Pain Present Now: Yes Electronic Signature(s) Signed: 09/10/2022 4:40:13 PM By: Blanche East RN Entered By: Blanche East on 09/10/2022 13:31:15 -------------------------------------------------------------------------------- Compression Therapy Details Patient Name: Date of Service: Amber, [name] 09/10/2022 1:45 PM Medical Record Number: [id number] Patient Account Number: [id number] Date of Birth/Sex: Treating RN: 1946-10-31 (76 y.o. [name] Primary Care Demetris Meinhardt: [name] Other Clinician: Referring Dryden Tapley: Treating [name]/Extender: [name] Session in Treatment: 6 Compression Therapy Performed for Wound Assessment: Wound #2 Left,Posterior Lower Leg Performed By: Clinician Blanche East,  RN Compression Type: Three Layer Post Procedure Diagnosis Same as Pre-procedure Electronic Signature(s) Signed: 09/10/2022 4:40:13 PM By: Blanche East RN Entered By: Blanche East on 09/10/2022 14:03:23 [name] ([id number]) [id number] Page 2 of 8 -------------------------------------------------------------------------------- Encounter Discharge Information Details Patient Name: Date of Service: [name], [name] 09/10/2022 1:45 PM Medical Record Number: [id number] Patient Account Number: [id number] Date of Birth/Sex: Treating RN: [birth year] (76 y.o. [name] Primary Care Nial Hawe: [name] Other Clinician: Referring Verner Kopischke: Treating Vedder Brittian/Extender: [name] Session in Treatment: 6 Encounter Discharge Information Items Post Procedure Vitals Discharge Condition: Stable Temperature (F): 97.7 Ambulatory Status: Ambulatory Pulse (bpm): 72 Discharge Destination: Home Respiratory Rate (breaths/min): 18 Transportation: Private Auto Blood Pressure (mmHg): 160/85 Accompanied By: self Schedule Follow-up Appointment: Yes Clinical Summary of Care: Electronic Signature(s) Signed: 09/10/2022 4:40:13 PM By: Blanche East RN Entered By: Blanche East on 09/10/2022 14:20:16 -------------------------------------------------------------------------------- Lower Extremity Assessment Details Patient Name: Date of Service: Amber, [name] 09/10/2022 1:45 PM Medical Record Number: [id number] Patient Account Number: [id number] Date of Birth/Sex: Treating RN: Dec 25, 1945 (76 y.o. [name] Primary Care Leiliana Foody: [name] Other Clinician: Referring Kyandra Mcclaine: Treating Ichael Pullara/Extender: Pixie Casino Weeks in Treatment: 6 Edema Assessment Assessed: [Left: No] [Right: No] [Left: Edema] [Right: :] Calf Left: Right: Point of Measurement: From Medial Instep 49.5 cm Ankle Left:  Right: Point of Measurement: From Medial Instep 23 cm Vascular Assessment Pulses: Dorsalis Pedis Palpable: [Left:Yes] Electronic Signature(s) Signed: 09/10/2022 4:40:13 PM By: Blanche East RN Entered By: Blanche East on 09/10/2022 13:37:41 Multi Wound Chart Details -------------------------------------------------------------------------------- [name] ([id number]) [id number] Page 3 of 8 Patient Name: Date of Service: Amber, [name] 09/10/2022 1:45 PM Medical Record Number: [id number] Patient Account Number: [id number] Date of Birth/Sex: Treating RN: 02/01/1946 (76 y.o. F) Primary Care Heavan Francom: [name] Other Clinician: Referring Deazia Lampi: Treating Venida Tsukamoto/Extender: [name] Session in Treatment: 6 Vital Signs Height(in): 63 Pulse(bpm): 72 Weight(lbs): 200 Blood Pressure(mmHg): 160/85 Body Mass Index(BMI): 35.4 Temperature(F): 97.7 Respiratory Rate(breaths/min): 18 Wound  Assessments Wound Number: 2 N/A N/A Photos: N/A N/A Left, Posterior Lower Leg N/A N/A Wound Location: Blister N/A N/A Wounding Event: Venous Leg Ulcer N/A N/A Primary Etiology: Cataracts, Asthma, Hypertension, N/A N/A Comorbid History: Neuropathy, Received Chemotherapy, Received Radiation 07/06/2022 N/A N/A Date Acquired: 6 N/A N/A Weeks of Treatment: Open N/A N/A Wound Status: No N/A N/A Wound Recurrence: 7x3.4x0.2 N/A N/A Measurements L x W x D (cm) 18.692 N/A N/A A (cm) : rea 3.738 N/A N/A Volume (cm) : -172.00% N/A N/A % Reduction in A rea: -444.10% N/A N/A % Reduction in Volume: Full Thickness Without Exposed N/A N/A Classification: Support Structures Medium N/A N/A Exudate A mount: Serosanguineous N/A N/A Exudate Type: red, brown N/A N/A Exudate Color: Small (1-33%) N/A N/A Granulation A mount: Red, Hyper-granulation, Friable N/A N/A Granulation Quality: Large (67-100%) N/A N/A Necrotic A  mount: Eschar, Adherent Slough N/A N/A Necrotic Tissue: Fat Layer (Subcutaneous Tissue): Yes N/A N/A Exposed Structures: Fascia: No Tendon: No Muscle: No Joint: No Bone: No None N/A N/A Epithelialization: Debridement - Selective/Open Wound N/A N/A Debridement: Pre-procedure Verification/Time Out 14:00 N/A N/A Taken: Lidocaine 5% topical ointment N/A N/A Pain Control: Slough N/A N/A Tissue Debrided: Non-Viable Tissue N/A N/A Level: 23.8 N/A N/A Debridement A (sq cm): rea Curette N/A N/A Instrument: Minimum N/A N/A Bleeding: Pressure N/A N/A Hemostasis A chieved: 0 N/A N/A Procedural Pain: 0 N/A N/A Post Procedural Pain: Procedure was tolerated well N/A N/A Debridement Treatment Response: 7x3.4x0.2 N/A N/A Post Debridement Measurements L x W x D (cm) 3.738 N/A N/A Post Debridement Volume: (cm) Excoriation: No N/A N/A Periwound Skin Texture: Maceration: No N/A N/A Periwound Skin Moisture: Yes N/A N/A Tenderness on Palpation: Compression Therapy N/A N/A Procedures Performed: Debridement Treatment Notes SAIRAH, KNOBLOCH ([id number]) [id number] Page 4 of 8 Wound #2 (Lower Leg) Wound Laterality: Left, Posterior Cleanser Soap and Water Discharge Instruction: May shower and wash wound with dial antibacterial soap and water prior to dressing change. Peri-Wound Care Sween Lotion (Moisturizing lotion) Discharge Instruction: Apply moisturizing lotion as directed Topical Primary Dressing Iodosorb Gel 10 (gm) Tube Discharge Instruction: Apply to wound bed as instructed Secondary Dressing Zetuvit Plus Silicone Border Dressing 4x4 (in/in) Discharge Instruction: Apply silicone border over primary dressing as directed. Secured With [employer] Surgical T 2x10 (in/yd) ape Discharge Instruction: Secure with tape as directed. Compression Wrap ThreePress (3 layer compression wrap) Discharge Instruction: Apply three layer  compression as directed. Compression Stockings Add-Ons Electronic Signature(s) Signed: 09/10/2022 2:21:32 PM By: Fredirick Maudlin MD FACS Entered By: Fredirick Maudlin on 09/10/2022 14:21:32 -------------------------------------------------------------------------------- Multi-Disciplinary Care Plan Details Patient Name: Date of Service: [name], [name] 09/10/2022 1:45 PM Medical Record Number: [id number] Patient Account Number: [id number] Date of Birth/Sex: Treating RN: 1946/03/06 (76 y.o. [name] Primary Care Olaoluwa Grieder: [name] Other Clinician: Referring Taeja Debellis: Treating Jeffery Bachmeier/Extender: [name] Session in Treatment: 6 Active Inactive Wound/Skin Impairment Nursing Diagnoses: Impaired tissue integrity Goals: Ulcer/skin breakdown will have a volume reduction of 30% by week 4 Date Initiated: 08/03/2022 Date Inactivated: 09/10/2022 Target Resolution Date: 08/31/2022 Unmet Reason: uncontrolled edema, Goal Status: Unmet acute infection Ulcer/skin breakdown will have a volume reduction of 50% by week 8 Date Initiated: 09/10/2022 Target Resolution Date: 10/08/2022 Goal Status: Active Interventions: Assess ulceration(s) every visit Treatment Activities: [name], [name] ([id number]) [id number] Page 5 of 8 Skin care regimen initiated : 08/03/2022 Notes: [facility] ordered. 10/30 RX Levo started. 09/10/22 Electronic Signature(s) Signed: 09/10/2022 4:40:13 PM By: Blanche East RN Entered By:  Blanche East on 09/10/2022 13:45:23 -------------------------------------------------------------------------------- Pain Assessment Details Patient Name: Date of Service: [name], DELANCEY 09/10/2022 1:45 PM Medical Record Number: [id number] Patient Account Number: [id number] Date of Birth/Sex: Treating RN: Feb 03, 1946 (76 y.o. [name] Primary Care Chesley Veasey: [name] Other Clinician: Referring Donnalynn Wheeless: Treating  Challis Crill/Extender: [name] Session in Treatment: 6 Active Problems Location of Pain Severity and Description of Pain Patient Has Paino Yes Site Locations Character of Pain Describe the Pain: Burning Pain Management and Medication Current Pain Management: Electronic Signature(s) Signed: 09/10/2022 4:40:13 PM By: Blanche East RN Entered By: Blanche East on 09/10/2022 13:34:37 -------------------------------------------------------------------------------- Patient/Caregiver Education Details Patient Name: Date of Service: [name] 11/6/2023andnbsp1:45 PM Medical Record Number: [id number] Patient Account Number: [id number] Date of Birth/Gender: Treating RN: 12-15-1945 (76 y.o. [name] Primary Care Physician: [name] Other Clinician: TALASIA, SAULTER ([id number]) [id number] Page 6 of 8 Referring Physician: Treating Physician/Extender: [name] Session in Treatment: 6 Education Assessment Education Provided To: Patient Education Topics Provided Wound/Skin Impairment: Methods: Explain/Verbal Responses: Reinforcements needed, State content correctly Electronic Signature(s) Signed: 09/10/2022 4:40:13 PM By: Blanche East RN Entered By: Blanche East on 09/10/2022 13:45:37 -------------------------------------------------------------------------------- Wound Assessment Details Patient Name: Date of Service: LABRENDA, LASKY 09/10/2022 1:45 PM Medical Record Number: [id number] Patient Account Number: [id number] Date of Birth/Sex: Treating RN: [birth year] (76 y.o. [name], [name] Primary Care Shanyn Preisler: [name] Other Clinician: Referring Edword Cu: Treating [name] Mangan/Extender: Pixie Casino Weeks in Treatment: 6 Wound Status Wound Number: 2 Primary Venous Leg Ulcer Etiology: Wound Location: Left, Posterior Lower Leg Wound Open Wounding Event:  Blister Status: Date Acquired: 07/06/2022 Comorbid Cataracts, Asthma, Hypertension, Neuropathy, Received Weeks Of Treatment: 6 History: Chemotherapy, Received Radiation Clustered Wound: No Photos Wound Measurements Length: (cm) 7 Width: (cm) 3.4 Depth: (cm) 0.2 Area: (cm) 18.692 Volume: (cm) 3.738 % Reduction in Area: -172% % Reduction in Volume: -444.1% Epithelialization: None Wound Description Classification: Full Thickness Without Exposed Suppor Exudate Amount: Medium Exudate Type: Serosanguineous Exudate Color: red, brown t Structures Wound Bed ANGELISSA, SUPAN ([id number]) [id number] Page 7 of 8 Granulation Amount: Small (1-33%) Exposed Structure Granulation Quality: Red, Hyper-granulation, Friable Fascia Exposed: No Necrotic Amount: Large (67-100%) Fat Layer (Subcutaneous Tissue) Exposed: Yes Necrotic Quality: Eschar, Adherent Slough Tendon Exposed: No Muscle Exposed: No Joint Exposed: No Bone Exposed: No Periwound Skin Texture Texture Color No Abnormalities Noted: No No Abnormalities Noted: No Excoriation: No Temperature / Pain Tenderness on Palpation: Yes Moisture No Abnormalities Noted: No Maceration: No Treatment Notes Wound #2 (Lower Leg) Wound Laterality: Left, Posterior Cleanser Soap and Water Discharge Instruction: May shower and wash wound with dial antibacterial soap and water prior to dressing change. Peri-Wound Care Sween Lotion (Moisturizing lotion) Discharge Instruction: Apply moisturizing lotion as directed Topical Primary Dressing Iodosorb Gel 10 (gm) Tube Discharge Instruction: Apply to wound bed as instructed Secondary Dressing Zetuvit Plus Silicone Border Dressing 4x4 (in/in) Discharge Instruction: Apply silicone border over primary dressing as directed. Secured With [employer] Surgical T 2x10 (in/yd) ape Discharge Instruction: Secure with tape as directed. Compression Wrap ThreePress (3  layer compression wrap) Discharge Instruction: Apply three layer compression as directed. Compression Stockings Add-Ons Electronic Signature(s) Signed: 09/10/2022 4:40:13 PM By: Blanche East RN Entered By: Blanche East on 09/10/2022 13:47:56 -------------------------------------------------------------------------------- Vitals Details Patient Name: Date of Service: ANGELIN, CUTRONE 09/10/2022 1:45 PM Medical Record Number: [id number] Patient Account Number: [id number] Date of Birth/Sex: Treating RN: 1946-05-19 (76 y.o. [name] Primary Care Barbera Perritt: Danise Mina,  Garlon Hatchet Other Clinician: Referring Ludmila Ebarb: Treating Alonso Gapinski/Extender: [name] Session in Treatment: 6 Vital Signs Time Taken: 13:27 Temperature (F): 97.7 Height (in): 63 Pulse (bpm): [address] ([id number]) [id number] Page 8 of 8 Weight (lbs): 200 Respiratory Rate (breaths/min): 18 Body Mass Index (BMI): 35.4 Blood Pressure (mmHg): 160/85 Reference Range: 80 - 120 mg / dl Electronic Signature(s) Signed: 09/10/2022 4:40:13 PM By: Blanche East RN Entered By: Blanche East on 09/10/2022 13:34:33

## 2022-09-10 NOTE — Progress Notes (Signed)
[name], YOKUM ([id number]) [id number] Page 1 of 9 Visit Report for 09/10/2022 Chief Complaint Document Details Patient Name: Date of Service: [name], KUBA 09/10/2022 1:45 PM Medical Record Number: [id number] Patient Account Number: [id number] Date of Birth/Sex: Treating RN: 18-Sep-1946 (76 y.o. F) Primary Care Provider: [name] Other Clinician: Referring Provider: Treating Provider/Extender: [name] Session in Treatment: 6 Information Obtained from: Patient Chief Complaint Patient presents for treatment of an open ulcer due to venous insufficiency Electronic Signature(s) Signed: 09/10/2022 2:21:40 PM By: Fredirick Maudlin MD FACS Entered By: Fredirick Maudlin on 09/10/2022 14:21:40 -------------------------------------------------------------------------------- Debridement Details Patient Name: Date of Service: [name], MANTEY 09/10/2022 1:45 PM Medical Record Number: [id number] Patient Account Number: [id number] Date of Birth/Sex: Treating RN: 24-[name]-1946 (76 y.o. [name], [name] Primary Care Provider: [name] Other Clinician: Referring Provider: Treating Provider/Extender: [name] Session in Treatment: 6 Debridement Performed for Assessment: Wound #2 Left,Posterior Lower Leg Performed By: Physician Fredirick Maudlin, MD Debridement Type: Debridement Severity of Tissue Pre Debridement: Fat layer exposed Level of Consciousness (Pre-procedure): Awake and Alert Pre-procedure Verification/Time Out Yes - 14:00 Taken: Start Time: 14:01 Pain Control: Lidocaine 5% topical ointment T Area Debrided (L x W): otal 7 (cm) x 3.4 (cm) = 23.8 (cm) Tissue and other material debrided: Non-Viable, Slough, Slough Level: Non-Viable Tissue Debridement Description: Selective/Open Wound Instrument: Curette Bleeding: Minimum Hemostasis Achieved: Pressure Procedural Pain: 0 Post Procedural Pain:  0 Response to Treatment: Procedure was tolerated well Level of Consciousness (Post- Awake and Alert procedure): Post Debridement Measurements of Total Wound Length: (cm) 7 Width: (cm) 3.4 Depth: (cm) 0.2 Volume: (cm) 3.738 Character of Wound/Ulcer Post Debridement: Requires Further Debridement Severity of Tissue Post Debridement: Fat layer exposed EVANA, RUNNELS ([id number]) [id number] Page 2 of 9 Post Procedure Diagnosis Same as Pre-procedure Notes Scribed for Dr. Celine Ahr by Blanche East, RN Electronic Signature(s) Signed: 09/10/2022 4:03:30 PM By: Fredirick Maudlin MD FACS Signed: 09/10/2022 4:40:13 PM By: Blanche East RN Entered By: Blanche East on 09/10/2022 14:03:10 -------------------------------------------------------------------------------- HPI Details Patient Name: Date of Service: [name], CLAYTOR 09/10/2022 1:45 PM Medical Record Number: [id number] Patient Account Number: [id number] Date of Birth/Sex: Treating RN: [name] 02, 1946 (76 y.o. F) Primary Care Provider: [name] Other Clinician: Referring Provider: Treating Provider/Extender: [name] Session in Treatment: 6 History of Present Illness HPI Description: ADMISSION This is a 76 year old woman with a history of chronic venous insufficiency status post saphenous vein ablations in 2010 and 2016. 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 She presents today with an open venous ulcer on her left lower leg. It has been present for a little over 2 weeks. She saw her primary care provider who apparently swabbed the wound and grew out Pseudomonas. She just completed a course of ciprofloxacin for this. ABI was 0.91. She reports that she has had previous issues with ulcers in this same location, stemming back to a punch biopsy taken by a dermatologist many years ago. She has had several skin substitutes applied to the area that have ultimately  resulted in healing on prior occasions. She has 2 small ulcers on her left medial lower leg. There is slough accumulation in both of them. The more anterior of the 2 is quite small and has some soft slough on the surface, underneath which there is good granulation tissue. The more medial wound also has slough accumulation, but the underlying surface is fairly fibrotic and gritty. This is consistent with her provided history of multiple  ulcers in the same location. 08/03/2022: The anterior wound is smaller today with just a little bit of slough accumulation. The more medial wound continues to be very sensitive and fibrotic with slough buildup. 08/13/2022: The anterior wound is closed. The more medial wound remains sensitive with a fairly fibrotic surface. There is more granulation tissue filling in, however, and there is less slough than on prior occasions. 08/20/2022: The anterior wound remains closed. The more medial wound has filled with granulation tissue but still has a fair amount of slough on the surface and remains fairly tender. Unfortunately, she has developed 2 small ulcers just proximal to this. The fat layer is exposed in each. She says that over the weekend, she felt a burning sensation in that location. 08/27/2022: The 2 small wounds proximal to the main wound have merged into a single site. The wounds look a little bit dry, but they are quite clean without significant slough accumulation. They remain quite tender. 09/03/2022: All of the wounds have now merged into 1 large wound. There is a strong odor coming from the wound and the surface is not particularly viable. It is extremely painful for her today. 09/10/2022: The wound is less black and purple this week but still does not look particularly viable. The surface is desiccated. The culture that I took last week returned with a polymicrobial population including Pseudomonas. I prescribed both Augmentin and levofloxacin. She has not yet  picked up levofloxacin, as her pharmacy only notified her yesterday that it was ready. We have ordered a [facility] topical compounded antibiotic, but it has not yet come. Electronic Signature(s) Signed: 09/10/2022 2:24:56 PM By: Fredirick Maudlin MD FACS Entered By: Fredirick Maudlin on 09/10/2022 14:24:56 -------------------------------------------------------------------------------- Physical Exam Details Patient Name: Date of Service: [name], HEMMERICH 09/10/2022 1:45 PM Medical Record Number: [id number] Patient Account Number: [id number] Date of Birth/Sex: Treating RN: Dec 30, 1945 (76 y.o. [name], DEPAULA ([id number]) [id number] Page 3 of 9 Primary Care Provider: [name] Other Clinician: Referring Provider: Treating Provider/Extender: [name] Session in Treatment: 6 Constitutional Hypertensive, asymptomatic. . . . No acute distress.Marland Kitchen Respiratory Normal work of breathing on room air.. Notes 09/10/2022: The wound is less black and purple this week but still does not look particularly viable. The surface is desiccated. Electronic Signature(s) Signed: 09/10/2022 2:25:32 PM By: Fredirick Maudlin MD FACS Entered By: Fredirick Maudlin on 09/10/2022 14:25:31 -------------------------------------------------------------------------------- Physician Orders Details Patient Name: Date of Service: ALANNAH, AVERHART 09/10/2022 1:45 PM Medical Record Number: [id number] Patient Account Number: [id number] Date of Birth/Sex: Treating RN: December 03, 1945 (76 y.o. [name], [name] Primary Care Provider: [name] Other Clinician: Referring Provider: Treating Provider/Extender: [name] Session in Treatment: 6 Verbal / Phone Orders: No Diagnosis Coding ICD-10 Coding Code Description [phone] Non-pressure chronic ulcer of other part of left lower leg with fat layer exposed I83.893 Varicose veins of bilateral lower  extremities with other complications T01.7 Venous insufficiency (chronic) (peripheral) G62.9 Polyneuropathy, unspecified R60.0 Localized edema Follow-up Appointments ppointment in 1 week. - Dr. Celine Ahr RM 4 Return A Monday 09/17/22 at 1:45 PM Anesthetic Wound #2 Left,Posterior Lower Leg (In clinic) Topical Lidocaine 5% applied to wound bed - in clinic, prior to debridement Bathing/ Shower/ Hygiene May shower with protection but do not get wound dressing(s) wet. Edema Control - Lymphedema / SCD / Other Left Lower Extremity Avoid standing for long periods of time. Patient to wear own compression stockings every day. - Right lower extremity Moisturize legs daily. Wound Treatment Wound #  2 - Lower Leg Wound Laterality: Left, Posterior Cleanser: Soap and Water 1 x Per Week/30 Days Discharge Instructions: May shower and wash wound with dial antibacterial soap and water prior to dressing change. Peri-Wound Care: Sween Lotion (Moisturizing lotion) 1 x Per Week/30 Days Discharge Instructions: Apply moisturizing lotion as directed Prim Dressing: Iodosorb Gel 10 (gm) Tube 1 x Per Week/30 Days ary Discharge Instructions: Apply to wound bed as instructed STACEY, SAGO ([id number]) [id number] Page 4 of 9 Secondary Dressing: Zetuvit Plus Silicone Border Dressing 4x4 (in/in) 1 x Per Week/30 Days Discharge Instructions: Apply silicone border over primary dressing as directed. Secured With: 37M Medipore Public affairs consultant Surgical T 2x10 (in/yd) 1 x Per Week/30 Days ape Discharge Instructions: Secure with tape as directed. Compression Wrap: ThreePress (3 layer compression wrap) 1 x Per Week/30 Days Discharge Instructions: Apply three layer compression as directed. Electronic Signature(s) Signed: 09/10/2022 4:03:30 PM By: Fredirick Maudlin MD FACS Entered By: Fredirick Maudlin on 09/10/2022  14:25:44 -------------------------------------------------------------------------------- Problem List Details Patient Name: Date of Service: COLLEENE, SWARTHOUT 09/10/2022 1:45 PM Medical Record Number: [id number] Patient Account Number: [id number] Date of Birth/Sex: Treating RN: 27-Nov-1945 (76 y.o. [name] Primary Care Provider: [name] Other Clinician: Referring Provider: Treating Provider/Extender: [name] Session in Treatment: 6 Active Problems ICD-10 Encounter Code Description Active Date MDM Diagnosis [phone] Non-pressure chronic ulcer of other part of left lower leg with fat layer exposed 07/27/2022 No Yes I83.893 Varicose veins of bilateral lower extremities with other complications 02/18/6062 No Yes I87.2 Venous insufficiency (chronic) (peripheral) 07/27/2022 No Yes G62.9 Polyneuropathy, unspecified 07/27/2022 No Yes R60.0 Localized edema 07/27/2022 No Yes Inactive Problems Resolved Problems Electronic Signature(s) Signed: 09/10/2022 2:21:25 PM By: Fredirick Maudlin MD FACS Entered By: Fredirick Maudlin on 09/10/2022 14:21:25 [name] ([id number]) [id number] Page 5 of 9 -------------------------------------------------------------------------------- Progress Note Details Patient Name: Date of Service: TIMBERLEE, ROBLERO 09/10/2022 1:45 PM Medical Record Number: [id number] Patient Account Number: [id number] Date of Birth/Sex: Treating RN: 30-Apr-1946 (76 y.o. F) Primary Care Provider: [name] Other Clinician: Referring Provider: Treating Provider/Extender: [name] Session in Treatment: 6 Subjective Chief Complaint Information obtained from Patient Patient presents for treatment of an open ulcer due to venous insufficiency History of Present Illness (HPI) ADMISSION This is a 76 year old woman with a history of chronic venous insufficiency status post saphenous vein  ablations in 2010 and 2016. 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 She presents today with an open venous ulcer on her left lower leg. It has been present for a little over 2 weeks. She saw her primary care provider who apparently swabbed the wound and grew out Pseudomonas. She just completed a course of ciprofloxacin for this. ABI was 0.91. She reports that she has had previous issues with ulcers in this same location, stemming back to a punch biopsy taken by a dermatologist many years ago. She has had several skin substitutes applied to the area that have ultimately resulted in healing on prior occasions. She has 2 small ulcers on her left medial lower leg. There is slough accumulation in both of them. The more anterior of the 2 is quite small and has some soft slough on the surface, underneath which there is good granulation tissue. The more medial wound also has slough accumulation, but the underlying surface is fairly fibrotic and gritty. This is consistent with her provided history of multiple ulcers in the same location. 08/03/2022: The anterior wound is smaller today with  just a little bit of slough accumulation. The more medial wound continues to be very sensitive and fibrotic with slough buildup. 08/13/2022: The anterior wound is closed. The more medial wound remains sensitive with a fairly fibrotic surface. There is more granulation tissue filling in, however, and there is less slough than on prior occasions. 08/20/2022: The anterior wound remains closed. The more medial wound has filled with granulation tissue but still has a fair amount of slough on the surface and remains fairly tender. Unfortunately, she has developed 2 small ulcers just proximal to this. The fat layer is exposed in each. She says that over the weekend, she felt a burning sensation in that location. 08/27/2022: The 2 small wounds proximal to the main wound have merged into a single site. The  wounds look a little bit dry, but they are quite clean without significant slough accumulation. They remain quite tender. 09/03/2022: All of the wounds have now merged into 1 large wound. There is a strong odor coming from the wound and the surface is not particularly viable. It is extremely painful for her today. 09/10/2022: The wound is less black and purple this week but still does not look particularly viable. The surface is desiccated. The culture that I took last week returned with a polymicrobial population including Pseudomonas. I prescribed both Augmentin and levofloxacin. She has not yet picked up levofloxacin, as her pharmacy only notified her yesterday that it was ready. We have ordered a [facility] topical compounded antibiotic, but it has not yet come. Patient History Information obtained from Patient. Family History Cancer - Mother,Paternal Grandparents, Diabetes - Siblings, Heart Disease - Father, Hypertension - Father. Social History Former smoker - quit in 1967. Medical History Eyes Patient has history of Cataracts - L eye Ear/Nose/Mouth/Throat Denies history of Chronic sinus problems/congestion, Middle ear problems Hematologic/Lymphatic Denies history of Anemia, Human Immunodeficiency Virus, Lymphedema Respiratory Patient has history of Asthma Cardiovascular Patient has history of Hypertension Endocrine Denies history of Type I Diabetes, Type II Diabetes Neurologic Patient has history of Neuropathy - Feet and finger Denies history of Seizure Disorder Oncologic Patient has history of Received Chemotherapy - 2012, Received Radiation - 2012 Hospitalization/Surgery History - Lower extremity venography 2020. - Intravascular ultrasound. - peripheral vascular intervention. - melanoma 2011. Medical A Surgical History Notes nd Respiratory OSA on CPAP Cardiovascular Cronic venous insufficiency Varicose veins of both lower extremities 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 Gastrointestinal liver cyst Musculoskeletal arthritis Neurologic TASHAYLA, THERIEN ([id number]) [id number] Page 6 of 9 restless leg syndrome Objective Constitutional Hypertensive, asymptomatic. No acute distress.. Vitals Time Taken: 1:27 PM, Height: 63 in, Weight: 200 lbs, BMI: 35.4, Temperature: 97.7 F, Pulse: 72 bpm, Respiratory Rate: 18 breaths/min, Blood Pressure: 160/85 mmHg. Respiratory Normal work of breathing on room air.. General Notes: 09/10/2022: The wound is less black and purple this week but still does not look particularly viable. The surface is desiccated. Integumentary (Hair, Skin) Wound #2 status is Open. Original cause of wound was Blister. The date acquired was: 07/06/2022. The wound has been in treatment 6 weeks. The wound is located on the Left,Posterior Lower Leg. The wound measures 7cm length x 3.4cm width x 0.2cm depth; 18.692cm^2 area and 3.738cm^3 volume. There is Fat Layer (Subcutaneous Tissue) exposed. There is a medium amount of serosanguineous drainage noted. There is small (1-33%) red, friable, hyper - granulation within the wound bed. There is a large (67-100%) amount of necrotic tissue within the wound bed including Eschar and Adherent Slough. The periwound skin appearance did not  exhibit: Excoriation, Maceration. The periwound has tenderness on palpation. Assessment Active Problems ICD-10 Non-pressure chronic ulcer of other part of left lower leg with fat layer exposed Varicose veins of bilateral lower extremities with other complications Venous insufficiency (chronic) (peripheral) Polyneuropathy, unspecified Localized edema Procedures Wound #2 Pre-procedure diagnosis of Wound #2 is a Venous Leg Ulcer located on the Left,Posterior Lower Leg .Severity of Tissue Pre Debridement is: Fat layer exposed. There was a Selective/Open Wound Non-Viable Tissue Debridement with a total area of 23.8 sq cm performed by Fredirick Maudlin, MD. With the following instrument(s): Curette to remove Non-Viable tissue/material. Material removed includes [facility] after achieving pain control using Lidocaine 5% topical ointment. No specimens were taken. A time out was conducted at 14:00, prior to the start of the procedure. A Minimum amount of bleeding was controlled with Pressure. The procedure was tolerated well with a pain level of 0 throughout and a pain level of 0 following the procedure. Post Debridement Measurements: 7cm length x 3.4cm width x 0.2cm depth; 3.738cm^3 volume. Character of Wound/Ulcer Post Debridement requires further debridement. Severity of Tissue Post Debridement is: Fat layer exposed. Post procedure Diagnosis Wound #2: Same as Pre-Procedure General Notes: Scribed for Dr. Celine Ahr by Blanche East, RN. Pre-procedure diagnosis of Wound #2 is a Venous Leg Ulcer located on the Left,Posterior Lower Leg . There was a Three Layer Compression Therapy Procedure by Blanche East, RN. Post procedure Diagnosis Wound #2: Same as Pre-Procedure Plan Follow-up Appointments: Return Appointment in 1 week. - Dr. Celine Ahr RM 4 Monday 09/17/22 at 1:45 PM Anesthetic: Wound #2 Left,Posterior Lower Leg: (In clinic) Topical Lidocaine 5% applied to wound bed - in clinic, prior to debridement Bathing/ Shower/ Hygiene: May shower with protection but do not get wound dressing(s) wet. Edema Control - Lymphedema / SCD / Other: Avoid standing for long periods of time. Patient to wear own compression stockings every day. - Right lower extremity OLIVIANA, MCGAHEE ([id number]) [id number] Page 7 of 9 Moisturize legs daily. WOUND #2: - Lower Leg Wound Laterality: Left, Posterior Cleanser: Soap and Water 1 x Per Week/30 Days Discharge Instructions: May shower and wash wound with dial antibacterial soap and water prior to dressing change. Peri-Wound Care: Sween Lotion (Moisturizing lotion) 1 x Per Week/30  Days Discharge Instructions: Apply moisturizing lotion as directed Prim Dressing: Iodosorb Gel 10 (gm) Tube 1 x Per Week/30 Days ary Discharge Instructions: Apply to wound bed as instructed Secondary Dressing: Zetuvit Plus Silicone Border Dressing 4x4 (in/in) 1 x Per Week/30 Days Discharge Instructions: Apply silicone border over primary dressing as directed. Secured With: 44M Medipore Public affairs consultant Surgical T 2x10 (in/yd) 1 x Per Week/30 Days ape Discharge Instructions: Secure with tape as directed. Com pression Wrap: ThreePress (3 layer compression wrap) 1 x Per Week/30 Days Discharge Instructions: Apply three layer compression as directed. 09/10/2022: The wound is less black and purple this week but still does not look particularly viable. The surface is desiccated. I used a curette to debride as much of the slough as the patient could tolerate. I had to stop before complete debridement was possible secondary to patient discomfort. I am going to change her dressing to Iodosorb as I think the Hydrofera Blue is drying things out too much and she needs ongoing chemical debridement between clinic visits. She will pick up her levofloxacin today and begin taking this along with the Augmentin. Hopefully her [employer] will be available for her visit next week. Electronic Signature(s) Signed: 09/10/2022 2:26:45 PM By: Fredirick Maudlin MD  FACS Entered By: Fredirick Maudlin on 09/10/2022 14:26:44 -------------------------------------------------------------------------------- HxROS Details Patient Name: Date of Service: [name], POTTENGER 09/10/2022 1:45 PM Medical Record Number: [id number] Patient Account Number: [id number] Date of Birth/Sex: Treating RN: 12-10-1945 (76 y.o. F) Primary Care Provider: [name] Other Clinician: Referring Provider: Treating Provider/Extender: [name] Session in Treatment: 6 Information Obtained From Patient Eyes Medical History: Positive  for: Cataracts - L eye Ear/Nose/Mouth/Throat Medical History: Negative for: Chronic sinus problems/congestion; Middle ear problems Hematologic/Lymphatic Medical History: Negative for: Anemia; Human Immunodeficiency Virus; Lymphedema Respiratory Medical History: Positive for: Asthma Past Medical History Notes: OSA on CPAP Cardiovascular Medical History: Positive for: Hypertension Past Medical History Notes: Cronic venous insufficiency 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 [name], ZEMANEK ([id number]) [id number] Page 8 of 9 Gastrointestinal Medical History: Past Medical History Notes: liver cyst Endocrine Medical History: Negative for: Type I Diabetes; Type II Diabetes Musculoskeletal Medical History: Past Medical History Notes: arthritis Neurologic Medical History: Positive for: Neuropathy - Feet and finger Negative for: Seizure Disorder Past Medical History Notes: restless leg syndrome Oncologic Medical History: Positive for: Received Chemotherapy - 2012; Received Radiation - 2012 HBO Extended History Items Eyes: Cataracts Immunizations Pneumococcal Vaccine: Received Pneumococcal Vaccination: Yes Received Pneumococcal Vaccination On or After 60th Birthday: Yes Implantable Devices No devices added Hospitalization / Surgery History Type of Hospitalization/Surgery Lower extremity venography 2020 Intravascular ultrasound peripheral vascular intervention melanoma 2011 Family and Social History Cancer: Yes - Mother,Paternal Grandparents; Diabetes: Yes - Siblings; Heart Disease: Yes - Father; Hypertension: Yes - Father; Former smoker - quit in [employer]) Signed: 09/10/2022 4:03:30 PM By: Fredirick Maudlin MD FACS Entered By: Fredirick Maudlin on 09/10/2022 14:25:03 -------------------------------------------------------------------------------- SuperBill Details Patient Name: Date of  Service: ANGELIC, SCHNELLE 09/10/2022 Medical Record Number: [id number] Patient Account Number: [id number] Date of Birth/Sex: Treating RN: [birth year] (76 y.o. F) Primary Care Provider: [name] Other Clinician: Referring Provider: Treating Provider/Extender: [name] Session in Treatment: [address], [place] J ([id number]) [id number] Page 9 of 9 Diagnosis Coding ICD-10 Codes Code Description [phone] Non-pressure chronic ulcer of other part of left lower leg with fat layer exposed I83.893 Varicose veins of bilateral lower extremities with other complications E08.1 Venous insufficiency (chronic) (peripheral) G62.9 Polyneuropathy, unspecified R60.0 Localized edema Facility Procedures : CPT4 Code: [id number] Description: 14970 - DEBRIDE WOUND 1ST 20 SQ CM OR < ICD-10 Diagnosis Description L97.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d Modifier: Quantity: 1 : CPT4 Code: [id number] Description: 50277 - DEBRIDE WOUND EA ADDL 20 SQ CM ICD-10 Diagnosis Description L97.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d Modifier: Quantity: 1 Physician Procedures : CPT4 Code Description Modifier [id number] - WC PHYS LEVEL 4 - EST PT 25 ICD-10 Diagnosis Description L97.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d I87.2 Venous insufficiency (chronic) (peripheral) V67.209  Varicose veins of bilateral lower extremities with other complications O70.9 Localized edema Quantity: 1 : [id number] - WC PHYS DEBR WO ANESTH 20 SQ CM ICD-10 Diagnosis Description L97.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d Quantity: 1 : [id number] - WC PHYS DEBR WO ANESTH EA ADD 20 CM ICD-10 Diagnosis Description L97.822 Non-pressure chronic ulcer of other part of left lower leg with fat layer  exposed Quantity: 1 Electronic Signature(s) Signed: 09/10/2022 2:27:37 PM By: Fredirick Maudlin MD FACS Entered By: Fredirick Maudlin  on 09/10/2022 14:27:37

## 2022-09-12 ENCOUNTER — Ambulatory Visit: Payer: Medicare Other | Admitting: Family Medicine

## 2022-09-12 ENCOUNTER — Encounter: Payer: Self-pay | Admitting: Family Medicine

## 2022-09-12 ENCOUNTER — Telehealth: Payer: Self-pay | Admitting: *Deleted

## 2022-09-12 NOTE — Progress Notes (Signed)
Patient did not keep appointment today. She will be called to reschedule.  

## 2022-09-12 NOTE — Telephone Encounter (Signed)
Left message to follow up on appt missed today to see if pt is having AUB issues, and to call us back and let us know

## 2022-09-15 ENCOUNTER — Other Ambulatory Visit: Payer: Self-pay | Admitting: Family Medicine

## 2022-09-17 ENCOUNTER — Encounter (HOSPITAL_BASED_OUTPATIENT_CLINIC_OR_DEPARTMENT_OTHER): Payer: Medicare Other | Admitting: General Surgery

## 2022-09-17 DIAGNOSIS — I872 Venous insufficiency (chronic) (peripheral): Secondary | ICD-10-CM | POA: Diagnosis not present

## 2022-09-17 DIAGNOSIS — I83028 Varicose veins of left lower extremity with ulcer other part of lower leg: Secondary | ICD-10-CM | POA: Diagnosis not present

## 2022-09-17 DIAGNOSIS — L97222 Non-pressure chronic ulcer of left calf with fat layer exposed: Secondary | ICD-10-CM | POA: Diagnosis not present

## 2022-09-17 DIAGNOSIS — R600 Localized edema: Secondary | ICD-10-CM | POA: Diagnosis not present

## 2022-09-17 DIAGNOSIS — I83891 Varicose veins of right lower extremities with other complications: Secondary | ICD-10-CM | POA: Diagnosis not present

## 2022-09-17 DIAGNOSIS — L97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d: Secondary | ICD-10-CM | POA: Diagnosis not present

## 2022-09-17 DIAGNOSIS — G629 Polyneuropathy, unspecified: Secondary | ICD-10-CM | POA: Diagnosis not present

## 2022-09-17 NOTE — Progress Notes (Signed)
[name], [name] ([id number]) [id number] Page 1 of 7 Visit Report for 09/17/2022 Arrival Information Details Patient Name: Date of Service: [name], [name] 09/17/2022 1:45 PM Medical Record Number: [id number] Patient Account Number: [id number] Date of Birth/Sex: Treating [name]: [name] 29, 1946 (76 y.o. [name] Primary Care [name] Other Clinician: Referring [name] Session in Treatment: 7 Visit Information History Since Last Visit Added or deleted any medications: No Patient Arrived: Ambulatory Any new allergies or adverse reactions: No Arrival Time: 13:55 Had a fall or experienced change in No Accompanied By: self activities of daily living that may affect Transfer Assistance: None risk of falls: Patient Requires Transmission-Based Precautions: No Signs or symptoms of abuse/neglect since last visito No Patient Has Alerts: No Hospitalized since last visit: No Implantable device outside of the clinic excluding No cellular tissue based products placed in the center since last visit: Has Compression in Place as Prescribed: Yes Pain Present Now: No Electronic Signature(s) Signed: 09/17/2022 5:04:49 PM By: [name] Entered By: Amber East on 09/17/2022 13:55:24 -------------------------------------------------------------------------------- Compression Therapy Details Patient Name: Date of Service: [name], [name] 09/17/2022 1:45 PM Medical Record Number: [id number] Patient Account Number: [id number] Date of Birth/Sex: Treating [name]: 08-04-1946 (76 y.o. [name] Primary Care Gracelynne Benedict: Ria [name] Other Clinician: Referring Inaki Vantine: Treating Zacariah Belue/Extender: [name] Session in Treatment: 7 Compression Therapy Performed for Wound Assessment: Wound #2 Left,Posterior Lower Leg Performed By: Clinician Amber East,  [name] Compression Type: Three Layer Post Procedure Diagnosis Same as Pre-procedure Electronic Signature(s) Signed: 09/17/2022 5:04:49 PM By: [name] Entered By: Amber East on 09/17/2022 14:17:29 [name] ([id number]) [id number] Page 2 of 7 -------------------------------------------------------------------------------- Encounter Discharge Information Details Patient Name: Date of Service: [name], [name] 09/17/2022 1:45 PM Medical Record Number: [id number] Patient Account Number: [id number] Date of Birth/Sex: Treating [name]: 17-Feb-1946 (76 y.o. [name] Primary Care Sona Nations: Ria [name] Other Clinician: Referring Brittiney Dicostanzo: Treating Ferris Tally/Extender: [name] Session in Treatment: 7 Encounter Discharge Information Items Post Procedure Vitals Discharge Condition: Stable Temperature (F): 97.8 Ambulatory Status: Ambulatory Pulse (bpm): 80 Discharge Destination: Home Respiratory Rate (breaths/min): 16 Transportation: Private Auto Blood Pressure (mmHg): 183/92 Accompanied By: self Schedule Follow-up Appointment: No Clinical Summary of Care: Electronic Signature(s) Signed: 09/17/2022 5:04:49 PM By: [name] Entered By: Amber East on 09/17/2022 14:38:46 -------------------------------------------------------------------------------- Lower Extremity Assessment Details Patient Name: Date of Service: [name], [name] 09/17/2022 1:45 PM Medical Record Number: [id number] Patient Account Number: [id number] Date of Birth/Sex: Treating [name]: 1946/02/27 (76 y.o. [name] Primary Care Zhaire Locker: Ria [name] Other Clinician: Referring Maliik Karner: Treating Nikya Busler/Extender: Pixie Casino Weeks in Treatment: 7 Edema Assessment Assessed: [Left: No] [Right: No] [Left: Edema] [Right: :] Calf Left: Right: Point of Measurement: From Medial Instep 44 cm Ankle Left:  Right: Point of Measurement: From Medial Instep 23 cm Vascular Assessment Pulses: Dorsalis Pedis Palpable: [Left:Yes] Electronic Signature(s) Signed: 09/17/2022 5:04:49 PM By: [name] Entered By: Amber East on 09/17/2022 14:05:58 Multi Wound Chart Details -------------------------------------------------------------------------------- [name] ([id number]) [id number] Page 3 of 7 Patient Name: Date of Service: [name], [name] 09/17/2022 1:45 PM Medical Record Number: [id number] Patient Account Number: [id number] Date of Birth/Sex: Treating [name]: [birth year] (76 y.o. F) Primary Care Korban Shearer: Ria [name] Other Clinician: Referring Ouita Nish: Treating Vernis Eid/Extender: [name] Session in Treatment: 7 Vital Signs Height(in): 63 Pulse(bpm): 80 Weight(lbs): 200 Blood Pressure(mmHg): 183/92 Body Mass Index(BMI): 35.4 Temperature(F): Respiratory Rate(breaths/min): 18 Wound Assessments  Wound Number: 2 N/A N/A Photos: N/A N/A Left, Posterior Lower Leg N/A N/A Wound Location: Blister N/A N/A Wounding Event: Venous Leg Ulcer N/A N/A Primary Etiology: Cataracts, Asthma, Hypertension, N/A N/A Comorbid History: Neuropathy, Received Chemotherapy, Received Radiation 07/06/2022 N/A N/A Date Acquired: 7 N/A N/A Weeks of Treatment: Open N/A N/A Wound Status: No N/A N/A Wound Recurrence: 6.5x3.8x0.1 N/A N/A Measurements L x W x D (cm) 19.399 N/A N/A A (cm) : rea 1.94 N/A N/A Volume (cm) : -182.30% N/A N/A % Reduction in A rea: -182.40% N/A N/A % Reduction in Volume: Full Thickness Without Exposed N/A N/A Classification: Support Structures Medium N/A N/A Exudate A mount: Serosanguineous N/A N/A Exudate Type: red, brown N/A N/A Exudate Color: Medium (34-66%) N/A N/A Granulation A mount: Red N/A N/A Granulation Quality: Medium (34-66%) N/A N/A Necrotic A mount: Eschar, Adherent Slough N/A  N/A Necrotic Tissue: Fat Layer (Subcutaneous Tissue): Yes N/A N/A Exposed Structures: Fascia: No Tendon: No Muscle: No Joint: No Bone: No Small (1-33%) N/A N/A Epithelialization: Debridement - Excisional N/A N/A Debridement: Pre-procedure Verification/Time Out 14:13 N/A N/A Taken: Lidocaine 4% Topical Solution N/A N/A Pain Control: Necrotic/Eschar, Subcutaneous, N/A N/A Tissue Debrided: Slough Skin/Subcutaneous Tissue N/A N/A Level: 24.7 N/A N/A Debridement A (sq cm): rea Curette N/A N/A Instrument: Minimum N/A N/A Bleeding: Pressure N/A N/A Hemostasis Achieved: 0 N/A N/A Procedural Pain: 0 N/A N/A Post Procedural Pain: Debridement Treatment Response: Procedure was tolerated well N/A N/A Post Debridement Measurements L x 6.5x3.8x0.1 N/A N/A W x D (cm) 1.94 N/A N/A Post Debridement Volume: (cm) Excoriation: No N/A N/A Periwound Skin Texture: Maceration: No N/A N/A Periwound Skin Moisture: Yes N/A N/A Tenderness on Palpation: Compression Therapy N/A N/A Procedures Performed: Debridement Treatment Notes [name], [name] ([id number]) [id number] Page 4 of 7 Electronic Signature(s) Signed: 09/17/2022 2:22:15 PM By: Fredirick Maudlin MD FACS Entered By: Fredirick Maudlin on 09/17/2022 14:22:14 -------------------------------------------------------------------------------- Multi-Disciplinary Care Plan Details Patient Name: Date of Service: [name], [name] 09/17/2022 1:45 PM Medical Record Number: [id number] Patient Account Number: [id number] Date of Birth/Sex: Treating [name]: [birth year] (76 y.o. [name] Primary Care Jordany Russett: Ria [name] Other Clinician: Referring Angelle Isais: Treating [name] [name]/Extender: [name] Session in Treatment: 7 Active Inactive Wound/Skin Impairment Nursing Diagnoses: Impaired tissue integrity Goals: Ulcer/skin breakdown will have a volume reduction of 30% by week 4 Date  Initiated: 08/03/2022 Date Inactivated: 09/10/2022 Target Resolution Date: 08/31/2022 Unmet Reason: uncontrolled edema, Goal Status: Unmet acute infection Ulcer/skin breakdown will have a volume reduction of 50% by week 8 Date Initiated: 09/10/2022 Target Resolution Date: 10/08/2022 Goal Status: Active Interventions: Assess ulceration(s) every visit Treatment Activities: Skin care regimen initiated : 08/03/2022 Notes: Keystone ordered. 10/30 RX Levo started. 09/10/22 Electronic Signature(s) Signed: 09/17/2022 5:04:49 PM By: [name] Entered By: Amber East on 09/17/2022 14:21:24 -------------------------------------------------------------------------------- Pain Assessment Details Patient Name: Date of Service: [name], [name] 09/17/2022 1:45 PM Medical Record Number: [id number] Patient Account Number: [id number] Date of Birth/Sex: Treating [name]: 1946-02-25 (76 y.o. [name] Primary Care Tajon Moring: Ria [name] Other Clinician: Referring Jireh Elmore: Treating Oluwafemi Villella/Extender: [name] Session in Treatment: 7 Active Problems Location of Pain Severity and Description of Pain Patient Has Paino No Site Locations PERSEPHANIE, LAATSCH ([id number]) [id number] Page 5 of 7 Character of Pain Describe the Pain: Burning Pain Management and Medication Current Pain Management: Electronic Signature(s) Signed: 09/17/2022 5:04:49 PM By: [name] Entered By: Amber East on 09/17/2022 13:56:22 -------------------------------------------------------------------------------- Patient/Caregiver Education Details Patient Name: Date of Service: [name],  [name] 11/13/2023andnbsp1:45 PM Medical Record Number: [id number] Patient Account Number: [id number] Date of Birth/Gender: Treating [name]: [birth year] (76 y.o. [name] Primary Care Physician: Ria [name] Other Clinician: Referring Physician: Treating  Physician/Extender: [name] Session in Treatment: 7 Education Assessment Education Provided To: Patient Education Topics Provided Infection: Methods: Explain/Verbal Responses: Reinforcements needed, State content correctly Wound/Skin Impairment: Methods: Explain/Verbal Responses: Reinforcements needed, State content correctly Electronic Signature(s) Signed: 09/17/2022 5:04:49 PM By: [name] Entered By: Amber East on 09/17/2022 14:21:49 [name] ([id number]) [id number] Page 6 of 7 -------------------------------------------------------------------------------- Wound Assessment Details Patient Name: Date of Service: [name], [name] 09/17/2022 1:45 PM Medical Record Number: [id number] Patient Account Number: [id number] Date of Birth/Sex: Treating [name]: 06/10/1946 (76 y.o. [name] Primary Care Shuntae Herzig: Ria [name] Other Clinician: Referring Danta Baumgardner: Treating Roshawnda Pecora/Extender: Pixie Casino Weeks in Treatment: 7 Wound Status Wound Number: 2 Primary Venous Leg Ulcer Etiology: Wound Location: Left, Posterior Lower Leg Wound Open Wounding Event: Blister Status: Date Acquired: 07/06/2022 Comorbid Cataracts, Asthma, Hypertension, Neuropathy, Received Weeks Of Treatment: 7 History: Chemotherapy, Received Radiation Clustered Wound: No Photos Wound Measurements Length: (cm) 6.5 Width: (cm) 3.8 Depth: (cm) 0.1 Area: (cm) 19.399 Volume: (cm) 1.94 % Reduction in Area: -182.3% % Reduction in Volume: -182.4% Epithelialization: Small (1-33%) Tunneling: No Undermining: No Wound Description Classification: Full Thickness Without Exposed Su Exudate Amount: Medium Exudate Type: Serosanguineous Exudate Color: red, brown pport Structures Wound Bed Granulation Amount: Medium (34-66%) Exposed Structure Granulation Quality: Red Fascia Exposed: No Necrotic Amount: Medium  (34-66%) Fat Layer (Subcutaneous Tissue) Exposed: Yes Necrotic Quality: Eschar, Adherent Slough Tendon Exposed: No Muscle Exposed: No Joint Exposed: No Bone Exposed: No Periwound Skin Texture Texture Color No Abnormalities Noted: No No Abnormalities Noted: No Excoriation: No Temperature / Pain Tenderness on Palpation: Yes Moisture No Abnormalities Noted: No Maceration: No Treatment Notes Wound #2 (Lower Leg) Wound Laterality: Left, Posterior Cleanser Soap and Water Discharge Instruction: May shower and wash wound with dial antibacterial soap and water prior to dressing change. Peri-Wound Care Sween Lotion (Moisturizing lotion) Discharge Instruction: Apply moisturizing lotion as directed [name], [name] ([id number]) [id number] Page 7 of 7 Topical Keystone Discharge Instruction: Apply as directed to wound Primary Dressing KerraCel Ag Gelling Fiber Dressing, 4x5 in (silver alginate) Discharge Instruction: Apply silver alginate to wound bed as instructed Secondary Dressing Zetuvit Plus Silicone Border Dressing 4x4 (in/in) Discharge Instruction: Apply silicone border over primary dressing as directed. Secured With [employer] Surgical T 2x10 (in/yd) ape Discharge Instruction: Secure with tape as directed. Compression Wrap ThreePress (3 layer compression wrap) Discharge Instruction: Apply three layer compression as directed. Compression Stockings Add-Ons Electronic Signature(s) Signed: 09/17/2022 5:04:49 PM By: [name] Entered By: Amber East on 09/17/2022 14:08:29 -------------------------------------------------------------------------------- Vitals Details Patient Name: Date of Service: KYAH, BUESING 09/17/2022 1:45 PM Medical Record Number: [id number] Patient Account Number: [id number] Date of Birth/Sex: Treating [name]: 21-Jul-1946 (76 y.o. [name] Primary Care Easton Fetty: Ria [name] Other  Clinician: Referring Lakeia Bradshaw: Treating Raihana Balderrama/Extender: Pixie Casino Weeks in Treatment: 7 Vital Signs Time Taken: 13:55 Pulse (bpm): 80 Height (in): 63 Respiratory Rate (breaths/min): 18 Weight (lbs): 200 Blood Pressure (mmHg): 183/92 Body Mass Index (BMI): 35.4 Reference Range: 80 - 120 mg / dl Electronic Signature(s) Signed: 09/17/2022 5:04:49 PM By: [name] Entered By: Amber East on 09/17/2022 13:56:06

## 2022-09-18 NOTE — Progress Notes (Signed)
RENATE, DANH ([id number]) [id number] Page 1 of 9 Visit Report for 09/17/2022 Chief Complaint Document Details Patient Name: Date of Service: [name], [name] 09/17/2022 1:45 PM Medical Record Number: [id number] Patient Account Number: [id number] Date of Birth/Sex: Treating RN: [birth year] (76 y.o. F) Primary Care Provider: [name] Other Clinician: Referring Provider: Treating Provider/Extender: [name] Session in Treatment: 7 Information Obtained from: Patient Chief Complaint Patient presents for treatment of an open ulcer due to venous insufficiency Electronic Signature(s) Signed: 09/17/2022 2:22:24 PM By: Fredirick Maudlin MD FACS Entered By: Fredirick Maudlin on 09/17/2022 14:22:23 -------------------------------------------------------------------------------- Debridement Details Patient Name: Date of Service: [name], [name] 09/17/2022 1:45 PM Medical Record Number: [id number] Patient Account Number: [id number] Date of Birth/Sex: Treating RN: 1945/12/10 (76 y.o. [name], [name] Primary Care Provider: [name] Other Clinician: Referring Provider: Treating Provider/Extender: [name] Session in Treatment: 7 Debridement Performed for Assessment: Wound #2 Left,Posterior Lower Leg Performed By: Physician Fredirick Maudlin, MD Debridement Type: Debridement Severity of Tissue Pre Debridement: Fat layer exposed Level of Consciousness (Pre-procedure): Awake and Alert Pre-procedure Verification/Time Out Yes - 14:13 Taken: Start Time: 14:14 Pain Control: Lidocaine 4% T opical Solution T Area Debrided (L x W): otal 6.5 (cm) x 3.8 (cm) = 24.7 (cm) Tissue and other material debrided: Viable, Non-Viable, Eschar, Slough, Subcutaneous, Slough Level: Skin/Subcutaneous Tissue Debridement Description: Excisional Instrument: Curette Bleeding: Minimum Hemostasis Achieved: Pressure Procedural  Pain: 0 Post Procedural Pain: 0 Response to Treatment: Procedure was tolerated well Level of Consciousness (Post- Awake and Alert procedure): Post Debridement Measurements of Total Wound Length: (cm) 6.5 Width: (cm) 3.8 Depth: (cm) 0.1 Volume: (cm) 1.94 Character of Wound/Ulcer Post Debridement: Requires Further Debridement Severity of Tissue Post Debridement: Fat layer exposed [name], [name] ([id number]) [id number] Page 2 of 9 Post Procedure Diagnosis Same as Pre-procedure Notes Scribed for Dr. Celine Ahr by Blanche East, RN Electronic Signature(s) Signed: 09/17/2022 5:04:49 PM By: Blanche East RN Signed: 09/18/2022 7:39:34 AM By: Fredirick Maudlin MD FACS Entered By: Blanche East on 09/17/2022 14:17:06 -------------------------------------------------------------------------------- HPI Details Patient Name: Date of Service: [name], [name] 09/17/2022 1:45 PM Medical Record Number: [id number] Patient Account Number: [id number] Date of Birth/Sex: Treating RN: 1946-09-12 (76 y.o. F) Primary Care Provider: [name] Other Clinician: Referring Provider: Treating Provider/Extender: [name] Session in Treatment: 7 History of Present Illness HPI Description: ADMISSION This is a 76 year old woman with a history of chronic venous insufficiency status post saphenous vein ablations in 2010 and 2016. 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 She presents today with an open venous ulcer on her left lower leg. It has been present for a little over 2 weeks. She saw her primary care provider who apparently swabbed the wound and grew out Pseudomonas. She just completed a course of ciprofloxacin for this. ABI was 0.91. She reports that she has had previous issues with ulcers in this same location, stemming back to a punch biopsy taken by a dermatologist many years ago. She has had several skin substitutes  applied to the area that have ultimately resulted in healing on prior occasions. She has 2 small ulcers on her left medial lower leg. There is slough accumulation in both of them. The more anterior of the 2 is quite small and has some soft slough on the surface, underneath which there is good granulation tissue. The more medial wound also has slough accumulation, but the underlying surface is fairly fibrotic and gritty. This is consistent with her provided  history of multiple ulcers in the same location. 08/03/2022: The anterior wound is smaller today with just a little bit of slough accumulation. The more medial wound continues to be very sensitive and fibrotic with slough buildup. 08/13/2022: The anterior wound is closed. The more medial wound remains sensitive with a fairly fibrotic surface. There is more granulation tissue filling in, however, and there is less slough than on prior occasions. 08/20/2022: The anterior wound remains closed. The more medial wound has filled with granulation tissue but still has a fair amount of slough on the surface and remains fairly tender. Unfortunately, she has developed 2 small ulcers just proximal to this. The fat layer is exposed in each. She says that over the weekend, she felt a burning sensation in that location. 08/27/2022: The 2 small wounds proximal to the main wound have merged into a single site. The wounds look a little bit dry, but they are quite clean without significant slough accumulation. They remain quite tender. 09/03/2022: All of the wounds have now merged into 1 large wound. There is a strong odor coming from the wound and the surface is not particularly viable. It is extremely painful for her today. 09/10/2022: The wound is less black and purple this week but still does not look particularly viable. The surface is desiccated. The culture that I took last week returned with a polymicrobial population including Pseudomonas. I prescribed both  Augmentin and levofloxacin. She has not yet picked up levofloxacin, as her pharmacy only notified her yesterday that it was ready. We have ordered a Keystone topical compounded antibiotic, but it has not yet come. 09/17/2022: The wound surface is markedly improved today. There is still an area of grayish-looking muscle but the rest appears significantly more viable. There is still a layer of slough on the surface. She still has a couple days left of oral antibiotic therapy. She has her [employer] compounded topical antibiotic with her today. Electronic Signature(s) Signed: 09/17/2022 2:23:14 PM By: Fredirick Maudlin MD FACS Entered By: Fredirick Maudlin on 09/17/2022 14:23:14 Physical Exam Details -------------------------------------------------------------------------------- [name] ([id number]) [id number] Page 3 of 9 Patient Name: Date of Service: [name], [name] 09/17/2022 1:45 PM Medical Record Number: [id number] Patient Account Number: [id number] Date of Birth/Sex: Treating RN: 09-Apr-1946 (76 y.o. F) Primary Care Provider: [name] Other Clinician: Referring Provider: Treating Provider/Extender: [name] Session in Treatment: 7 Constitutional Hypertensive, asymptomatic. . . . No acute distress.Marland Kitchen Respiratory Normal work of breathing on room air.. Notes 09/17/2022: The wound surface is markedly improved today. There is still an area of grayish-looking muscle but the rest appears significantly more viable. There is still a layer of slough on the surface. Electronic Signature(s) Signed: 09/17/2022 2:23:51 PM By: Fredirick Maudlin MD FACS Entered By: Fredirick Maudlin on 09/17/2022 14:23:50 -------------------------------------------------------------------------------- Physician Orders Details Patient Name: Date of Service: [name], [name] 09/17/2022 1:45 PM Medical Record Number: [id number] Patient Account Number:  [id number] Date of Birth/Sex: Treating RN: [birth year] (76 y.o. [name], [name] Primary Care Provider: [name] Other Clinician: Referring Provider: Treating Provider/Extender: [name] Session in Treatment: 7 Verbal / Phone Orders: No Diagnosis Coding ICD-10 Coding Code Description [phone] Non-pressure chronic ulcer of other part of left lower leg with fat layer exposed I83.893 Varicose veins of bilateral lower extremities with other complications D78.2 Venous insufficiency (chronic) (peripheral) G62.9 Polyneuropathy, unspecified R60.0 Localized edema Follow-up Appointments ppointment in 1 week. - Dr. Celine Ahr RM 4 Return A Anesthetic Wound #2 Left,Posterior Lower Leg (In  clinic) Topical Lidocaine 5% applied to wound bed - in clinic, prior to debridement Bathing/ Shower/ Hygiene May shower with protection but do not get wound dressing(s) wet. Edema Control - Lymphedema / SCD / Other Left Lower Extremity Avoid standing for long periods of time. Patient to wear own compression stockings every day. - Right lower extremity Moisturize legs daily. Wound Treatment Wound #2 - Lower Leg Wound Laterality: Left, Posterior Cleanser: Soap and Water 1 x Per Week/30 Days Discharge Instructions: May shower and wash wound with dial antibacterial soap and water prior to dressing change. Peri-Wound Care: Sween Lotion (Moisturizing lotion) 1 x Per Week/30 Days Discharge Instructions: Apply moisturizing lotion as directed [name], [name] ([id number]) [id number] Page 4 of 9 Topical: Keystone 1 x Per Week/30 Days Discharge Instructions: Apply as directed to wound Prim Dressing: KerraCel Ag Gelling Fiber Dressing, 4x5 in (silver alginate) 1 x Per Week/30 Days ary Discharge Instructions: Apply silver alginate to wound bed as instructed Secondary Dressing: Zetuvit Plus Silicone Border Dressing 4x4 (in/in) 1 x Per Week/30 Days Discharge  Instructions: Apply silicone border over primary dressing as directed. Secured With: 53M Medipore Public affairs consultant Surgical T 2x10 (in/yd) 1 x Per Week/30 Days ape Discharge Instructions: Secure with tape as directed. Compression Wrap: ThreePress (3 layer compression wrap) 1 x Per Week/30 Days Discharge Instructions: Apply three layer compression as directed. Electronic Signature(s) Signed: 09/18/2022 7:39:34 AM By: Fredirick Maudlin MD FACS Entered By: Fredirick Maudlin on 09/17/2022 14:24:03 -------------------------------------------------------------------------------- Problem List Details Patient Name: Date of Service: LATANGA, NEDROW 09/17/2022 1:45 PM Medical Record Number: [id number] Patient Account Number: [id number] Date of Birth/Sex: Treating RN: [birth year] (76 y.o. F) Primary Care Provider: [name] Other Clinician: Referring Provider: Treating Provider/Extender: [name] Session in Treatment: 7 Active Problems ICD-10 Encounter Code Description Active Date MDM Diagnosis [phone] Non-pressure chronic ulcer of other part of left lower leg with fat layer exposed 07/27/2022 No Yes I83.893 Varicose veins of bilateral lower extremities with other complications 12/19/863 No Yes I87.2 Venous insufficiency (chronic) (peripheral) 07/27/2022 No Yes G62.9 Polyneuropathy, unspecified 07/27/2022 No Yes R60.0 Localized edema 07/27/2022 No Yes Inactive Problems Resolved Problems Electronic Signature(s) Signed: 09/17/2022 2:22:09 PM By: Fredirick Maudlin MD FACS Previous Signature: 09/17/2022 2:21:42 PM Version By: Fredirick Maudlin MD FACS Entered By: Fredirick Maudlin on 09/17/2022 14:22:08 [name] ([id number]) [id number] Page 5 of 9 -------------------------------------------------------------------------------- Progress Note Details Patient Name: Date of Service: TAIMI, TOWE 09/17/2022 1:45 PM Medical Record Number:  [id number] Patient Account Number: [id number] Date of Birth/Sex: Treating RN: 28-May-1946 (76 y.o. F) Primary Care Provider: [name] Other Clinician: Referring Provider: Treating Provider/Extender: [name] Session in Treatment: 7 Subjective Chief Complaint Information obtained from Patient Patient presents for treatment of an open ulcer due to venous insufficiency History of Present Illness (HPI) ADMISSION This is a 76 year old woman with a history of chronic venous insufficiency status post saphenous vein ablations in 2010 and 2016. 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 She presents today with an open venous ulcer on her left lower leg. It has been present for a little over 2 weeks. She saw her primary care provider who apparently swabbed the wound and grew out Pseudomonas. She just completed a course of ciprofloxacin for this. ABI was 0.91. She reports that she has had previous issues with ulcers in this same location, stemming back to a punch biopsy taken by a dermatologist many years ago. She has had several skin substitutes applied to  the area that have ultimately resulted in healing on prior occasions. She has 2 small ulcers on her left medial lower leg. There is slough accumulation in both of them. The more anterior of the 2 is quite small and has some soft slough on the surface, underneath which there is good granulation tissue. The more medial wound also has slough accumulation, but the underlying surface is fairly fibrotic and gritty. This is consistent with her provided history of multiple ulcers in the same location. 08/03/2022: The anterior wound is smaller today with just a little bit of slough accumulation. The more medial wound continues to be very sensitive and fibrotic with slough buildup. 08/13/2022: The anterior wound is closed. The more medial wound remains sensitive with a fairly fibrotic surface. There is more  granulation tissue filling in, however, and there is less slough than on prior occasions. 08/20/2022: The anterior wound remains closed. The more medial wound has filled with granulation tissue but still has a fair amount of slough on the surface and remains fairly tender. Unfortunately, she has developed 2 small ulcers just proximal to this. The fat layer is exposed in each. She says that over the weekend, she felt a burning sensation in that location. 08/27/2022: The 2 small wounds proximal to the main wound have merged into a single site. The wounds look a little bit dry, but they are quite clean without significant slough accumulation. They remain quite tender. 09/03/2022: All of the wounds have now merged into 1 large wound. There is a strong odor coming from the wound and the surface is not particularly viable. It is extremely painful for her today. 09/10/2022: The wound is less black and purple this week but still does not look particularly viable. The surface is desiccated. The culture that I took last week returned with a polymicrobial population including Pseudomonas. I prescribed both Augmentin and levofloxacin. She has not yet picked up levofloxacin, as her pharmacy only notified her yesterday that it was ready. We have ordered a Keystone topical compounded antibiotic, but it has not yet come. 09/17/2022: The wound surface is markedly improved today. There is still an area of grayish-looking muscle but the rest appears significantly more viable. There is still a layer of slough on the surface. She still has a couple days left of oral antibiotic therapy. She has her [employer] compounded topical antibiotic with her today. Patient History Information obtained from Patient. Family History Cancer - Mother,Paternal Grandparents, Diabetes - Siblings, Heart Disease - Father, Hypertension - Father. Social History Former smoker - quit in 1967. Medical History Eyes Patient has history of  Cataracts - L eye Ear/Nose/Mouth/Throat Denies history of Chronic sinus problems/congestion, Middle ear problems Hematologic/Lymphatic Denies history of Anemia, Human Immunodeficiency Virus, Lymphedema Respiratory Patient has history of Asthma Cardiovascular Patient has history of Hypertension Endocrine Denies history of Type I Diabetes, Type II Diabetes Neurologic Patient has history of Neuropathy - Feet and finger Denies history of Seizure Disorder Oncologic Patient has history of Received Chemotherapy - 2012, Received Radiation - 2012 [name], [name] ([id number]) [id number] Page 6 of 9 Hospitalization/Surgery History - Lower extremity venography 2020. - Intravascular ultrasound. - peripheral vascular intervention. - melanoma 2011. 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 No acute distress.. Vitals Time Taken: 1:55 PM, Height: 63 in, Weight: 200 lbs, BMI: 35.4, Pulse: 80 bpm,  Respiratory Rate: 18 breaths/min, Blood Pressure: 183/92 mmHg. Respiratory Normal work of breathing on room air.. General Notes: 09/17/2022: The wound surface is markedly improved today. There is still an area of grayish-looking muscle but the rest appears significantly more viable. There is still a layer of slough on the surface. Integumentary (Hair, Skin) Wound #2 status is Open. Original cause of wound was Blister. The date acquired was: 07/06/2022. The wound has been in treatment 7 weeks. The wound is located on the Left,Posterior Lower Leg. The wound measures 6.5cm length x 3.8cm width x 0.1cm depth; 19.399cm^2 area and 1.94cm^3 volume. There is Fat Layer (Subcutaneous Tissue) exposed. There is no tunneling or  undermining noted. There is a medium amount of serosanguineous drainage noted. There is medium (34-66%) red granulation within the wound bed. There is a medium (34-66%) amount of necrotic tissue within the wound bed including Eschar and Adherent Slough. The periwound skin appearance did not exhibit: Excoriation, Maceration. The periwound has tenderness on palpation. Assessment Active Problems ICD-10 Non-pressure chronic ulcer of other part of left lower leg with fat layer exposed Varicose veins of bilateral lower extremities with other complications Venous insufficiency (chronic) (peripheral) Polyneuropathy, unspecified Localized edema Procedures Wound #2 Pre-procedure diagnosis of Wound #2 is a Venous Leg Ulcer located on the Left,Posterior Lower Leg .Severity of Tissue Pre Debridement is: Fat layer exposed. There was a Excisional Skin/Subcutaneous Tissue Debridement with a total area of 24.7 sq cm performed by Fredirick Maudlin, MD. With the following instrument(s): Curette to remove Viable and Non-Viable tissue/material. Material removed includes Eschar, Subcutaneous Tissue, and Slough after achieving pain control using Lidocaine 4% Topical Solution. No specimens were taken. A time out was conducted at 14:13, prior to the start of the procedure. A Minimum amount of bleeding was controlled with Pressure. The procedure was tolerated well with a pain level of 0 throughout and a pain level of 0 following the procedure. Post Debridement Measurements: 6.5cm length x 3.8cm width x 0.1cm depth; 1.94cm^3 volume. Character of Wound/Ulcer Post Debridement requires further debridement. Severity of Tissue Post Debridement is: Fat layer exposed. Post procedure Diagnosis Wound #2: Same as Pre-Procedure General Notes: Scribed for Dr. Celine Ahr by Blanche East, RN. Pre-procedure diagnosis of Wound #2 is a Venous Leg Ulcer located on the Left,Posterior Lower Leg . There was a Three Layer Compression  Therapy Procedure by Blanche East, RN. Post procedure Diagnosis Wound #2: Same as Pre-Procedure Plan [name], [name] ([id number]) [id number] Page 7 of 9 Follow-up Appointments: Return Appointment in 1 week. - Dr. Celine Ahr RM 4 Anesthetic: Wound #2 Left,Posterior Lower Leg: (In clinic) Topical Lidocaine 5% applied to wound bed - in clinic, prior to debridement Bathing/ Shower/ Hygiene: May shower with protection but do not get wound dressing(s) wet. Edema Control - Lymphedema / SCD / Other: Avoid standing for long periods of time. Patient to wear own compression stockings every day. - Right lower extremity Moisturize legs daily. WOUND #2: - Lower Leg Wound Laterality: Left, Posterior Cleanser: Soap and Water 1 x Per Week/30 Days Discharge Instructions: May shower and wash wound with dial antibacterial soap and water prior to dressing change. Peri-Wound Care: Sween Lotion (Moisturizing lotion) 1 x Per Week/30 Days Discharge Instructions: Apply moisturizing lotion as directed Topical: Keystone 1 x Per Week/30 Days Discharge Instructions: Apply as directed to wound Prim Dressing: KerraCel Ag Gelling Fiber Dressing, 4x5 in (silver alginate) 1 x Per Week/30 Days ary Discharge Instructions: Apply silver alginate to wound bed as instructed Secondary Dressing: Zetuvit Plus  Silicone Border Dressing 4x4 (in/in) 1 x Per Week/30 Days Discharge Instructions: Apply silicone border over primary dressing as directed. Secured With: 69M Medipore Public affairs consultant Surgical T 2x10 (in/yd) 1 x Per Week/30 Days ape Discharge Instructions: Secure with tape as directed. Com pression Wrap: ThreePress (3 layer compression wrap) 1 x Per Week/30 Days Discharge Instructions: Apply three layer compression as directed. 09/17/2022: The wound surface is markedly improved today. There is still an area of grayish-looking muscle but the rest appears significantly more viable. There is still a layer  of slough on the surface. I used a curette to debride slough and nonviable subcutaneous tissue from the wound. We will use her Keystone topical antibiotic compound with silver alginate and 3 layer compression. She will complete her course of oral Augmentin and levofloxacin. Follow-up in 1 week. Electronic Signature(s) Signed: 09/17/2022 2:24:47 PM By: Fredirick Maudlin MD FACS Entered By: Fredirick Maudlin on 09/17/2022 14:24:46 -------------------------------------------------------------------------------- HxROS Details Patient Name: Date of Service: [name], [name] 09/17/2022 1:45 PM Medical Record Number: [id number] Patient Account Number: [id number] Date of Birth/Sex: Treating RN: Dec 06, 1945 (76 y.o. F) Primary Care Provider: [name] Other Clinician: Referring Provider: Treating Provider/Extender: [name] Session in Treatment: 7 Information Obtained From Patient Eyes Medical History: Positive for: Cataracts - L eye Ear/Nose/Mouth/Throat Medical History: Negative for: Chronic sinus problems/congestion; Middle ear problems Hematologic/Lymphatic Medical History: Negative for: Anemia; Human Immunodeficiency Virus; Lymphedema Respiratory Medical History: Positive for: Asthma [name], [name] ([id number]) [id number] Page 8 of 9 Past Medical History Notes: OSA on CPAP Cardiovascular Medical History: Positive for: Hypertension Past Medical History Notes: Cronic venous insufficiency 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 Gastrointestinal Medical History: Past Medical History Notes: liver cyst Endocrine Medical History: Negative for: Type I Diabetes; Type II Diabetes Musculoskeletal Medical History: Past Medical History Notes: arthritis Neurologic Medical History: Positive for: Neuropathy - Feet and finger Negative for: Seizure Disorder Past Medical History Notes: restless leg  syndrome Oncologic Medical History: Positive for: Received Chemotherapy - 2012; Received Radiation - 2012 HBO Extended History Items Eyes: Cataracts Immunizations Pneumococcal Vaccine: Received Pneumococcal Vaccination: Yes Received Pneumococcal Vaccination On or After 60th Birthday: Yes Implantable Devices No devices added Hospitalization / Surgery History Type of Hospitalization/Surgery Lower extremity venography 2020 Intravascular ultrasound peripheral vascular intervention melanoma 2011 Family and Social History Cancer: Yes - Mother,Paternal Grandparents; Diabetes: Yes - Siblings; Heart Disease: Yes - Father; Hypertension: Yes - Father; Former smoker - quit in [employer]) Signed: 09/18/2022 7:39:34 AM By: Fredirick Maudlin MD FACS Entered By: Fredirick Maudlin on 09/17/2022 14:23:26 [name], QUILTER ([id number]) [id number] Page 9 of 9 -------------------------------------------------------------------------------- SuperBill Details Patient Name: Date of Service: BOB, [place] 09/17/2022 Medical Record Number: [id number] Patient Account Number: [id number] Date of Birth/Sex: Treating RN: [birth year] (76 y.o. F) Primary Care Provider: [name] Other Clinician: Referring Provider: Treating Provider/Extender: [name] Session in Treatment: 7 Diagnosis Coding ICD-10 Codes Code Description [phone] Non-pressure chronic ulcer of other part of left lower leg with fat layer exposed I83.893 Varicose veins of bilateral lower extremities with other complications J68.1 Venous insufficiency (chronic) (peripheral) G62.9 Polyneuropathy, unspecified R60.0 Localized edema Facility Procedures : CPT4 Code: [id number] Description: 55974 - DEB SUBQ TISSUE 20 SQ CM/< ICD-10 Diagnosis Description L97.822 Non-pressure chronic ulcer of other part of left lower leg with fat layer expo Modifier: sed Quantity: 1 : CPT4  Code: [id number] Description: 46803 - DEB SUBQ TISS EA ADDL 20CM ICD-10 Diagnosis Description L97.822 Non-pressure chronic ulcer of other part  of left lower leg with fat layer expo Modifier: sed Quantity: 1 Physician Procedures : CPT4 Code Description Modifier [id number] 99214 - WC PHYS LEVEL 4 - EST PT 25 ICD-10 Diagnosis Description L97.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d I87.2 Venous insufficiency (chronic) (peripheral) R60.0  Localized edema I83.893 Varicose veins of bilateral lower extremities with other complications Quantity: 1 : [id number] 11042 - WC PHYS SUBQ TISS 20 SQ CM ICD-10 Diagnosis Description L97.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d Quantity: 1 : [id number] 11045 - WC PHYS SUBQ TISS EA ADDL 20 CM ICD-10 Diagnosis Description L97.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d Quantity: 1 Electronic Signature(s) Signed: 09/17/2022 2:25:11 PM By: Fredirick Maudlin MD FACS Entered By: Fredirick Maudlin on 09/17/2022 14:25:10

## 2022-09-24 ENCOUNTER — Encounter (HOSPITAL_BASED_OUTPATIENT_CLINIC_OR_DEPARTMENT_OTHER): Payer: Medicare Other | Admitting: General Surgery

## 2022-09-24 DIAGNOSIS — I83028 Varicose veins of left lower extremity with ulcer other part of lower leg: Secondary | ICD-10-CM | POA: Diagnosis not present

## 2022-09-24 DIAGNOSIS — G629 Polyneuropathy, unspecified: Secondary | ICD-10-CM | POA: Diagnosis not present

## 2022-09-24 DIAGNOSIS — R600 Localized edema: Secondary | ICD-10-CM | POA: Diagnosis not present

## 2022-09-24 DIAGNOSIS — L97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d: Secondary | ICD-10-CM | POA: Diagnosis not present

## 2022-09-24 DIAGNOSIS — I872 Venous insufficiency (chronic) (peripheral): Secondary | ICD-10-CM | POA: Diagnosis not present

## 2022-09-24 DIAGNOSIS — L97222 Non-pressure chronic ulcer of left calf with fat layer exposed: Secondary | ICD-10-CM | POA: Diagnosis not present

## 2022-09-24 DIAGNOSIS — I83891 Varicose veins of right lower extremities with other complications: Secondary | ICD-10-CM | POA: Diagnosis not present

## 2022-09-24 NOTE — Progress Notes (Signed)
Amber, [name] ([id number]) [id number] Page 1 of 7 Visit Report for 09/24/2022 Arrival Information Details Patient Name: Date of Service: [name], [name] 09/24/2022 1:45 PM Medical Record Number: [id number] Patient Account Number: [id number] Date of Birth/Sex: Treating RN: January 25, 1946 (76 y.o. [name], [name] Primary Care Krayton Wortley: [name] Other Clinician: Referring Jorell Agne: Treating Cabot Cromartie/Extender: [name] Session in Treatment: 8 Visit Information History Since Last Visit Added or deleted any medications: No Patient Arrived: Ambulatory Any new allergies or adverse reactions: No Arrival Time: 14:11 Had a fall or experienced change in No Accompanied By: self activities of daily living that may affect Transfer Assistance: None risk of falls: Patient Requires Transmission-Based Precautions: No Signs or symptoms of abuse/neglect since last visito No Patient Has Alerts: No Hospitalized since last visit: No Implantable device outside of the clinic excluding No cellular tissue based products placed in the center since last visit: Has Compression in Place as Prescribed: Yes Pain Present Now: Yes Electronic Signature(s) Signed: 09/24/2022 4:43:27 PM By: Blanche East RN Entered By: Blanche East on 09/24/2022 14:13:25 -------------------------------------------------------------------------------- Compression Therapy Details Patient Name: Date of Service: [name], [name] 09/24/2022 1:45 PM Medical Record Number: [id number] Patient Account Number: [id number] Date of Birth/Sex: Treating RN: 27-Jul-1946 (76 y.o. [name] Primary Care Ahava Kissoon: [name] Other Clinician: Referring Ikran Patman: Treating Jacquita Mulhearn/Extender: [name] Session in Treatment: 8 Compression Therapy Performed for Wound Assessment: Wound #2 Left,Posterior Lower Leg Performed By: Clinician Blanche East,  RN Compression Type: Three Layer Post Procedure Diagnosis Same as Pre-procedure Electronic Signature(s) Signed: 09/24/2022 4:43:27 PM By: Blanche East RN Entered By: Blanche East on 09/24/2022 14:30:44 [name] ([id number]) [id number] Page 2 of 7 -------------------------------------------------------------------------------- Encounter Discharge Information Details Patient Name: Date of Service: Amber, [name] 09/24/2022 1:45 PM Medical Record Number: [id number] Patient Account Number: [id number] Date of Birth/Sex: Treating RN: [birth year] (76 y.o. [name] Primary Care Jaslynn Thome: [name] Other Clinician: Referring Newel Oien: Treating Hurshel Bouillon/Extender: [name] Session in Treatment: 8 Encounter Discharge Information Items Post Procedure Vitals Discharge Condition: Stable Temperature (F): 98.0 Ambulatory Status: Ambulatory Pulse (bpm): 75 Discharge Destination: Home Respiratory Rate (breaths/min): 18 Transportation: Private Auto Blood Pressure (mmHg): 161/83 Accompanied By: self Schedule Follow-up Appointment: Yes Clinical Summary of Care: Electronic Signature(s) Signed: 09/24/2022 4:43:27 PM By: Blanche East RN Entered By: Blanche East on 09/24/2022 16:23:42 -------------------------------------------------------------------------------- Lower Extremity Assessment Details Patient Name: Date of Service: Amber, [name] 09/24/2022 1:45 PM Medical Record Number: [id number] Patient Account Number: [id number] Date of Birth/Sex: Treating RN: 1946-09-27 (76 y.o. [name] Primary Care Leith Hedlund: [name] Other Clinician: Referring Caydance Kuehnle: Treating Matheu Ploeger/Extender: Pixie Casino Weeks in Treatment: 8 Edema Assessment Assessed: [Left: No] [Right: No] [Left: Edema] [Right: :] Calf Left: Right: Point of Measurement: From Medial Instep 44.5 cm Ankle Left:  Right: Point of Measurement: From Medial Instep 22.5 cm Vascular Assessment Pulses: Dorsalis Pedis Palpable: [Left:Yes] Electronic Signature(s) Signed: 09/24/2022 4:43:27 PM By: Blanche East RN Entered By: Blanche East on 09/24/2022 14:14:05 Multi Wound Chart Details -------------------------------------------------------------------------------- [name] ([id number]) [id number] Page 3 of 7 Patient Name: Date of Service: Amber, [name] 09/24/2022 1:45 PM Medical Record Number: [id number] Patient Account Number: [id number] Date of Birth/Sex: Treating RN: 1946/03/15 (76 y.o. F) Primary Care Cayce Paschal: [name] Other Clinician: Referring Elizah Mierzwa: Treating Krystin Keeven/Extender: [name] Session in Treatment: 8 Vital Signs Height(in): 63 Pulse(bpm): 75 Weight(lbs): 200 Blood Pressure(mmHg): 161/83 Body Mass Index(BMI): 35.4 Temperature(F): 98.0 Respiratory Rate(breaths/min): 18 Wound  Assessments Wound Number: 2 N/A N/A Photos: N/A N/A Left, Posterior Lower Leg N/A N/A Wound Location: Blister N/A N/A Wounding Event: Venous Leg Ulcer N/A N/A Primary Etiology: Cataracts, Asthma, Hypertension, N/A N/A Comorbid History: Neuropathy, Received Chemotherapy, Received Radiation 07/06/2022 N/A N/A Date Acquired: 8 N/A N/A Weeks of Treatment: Open N/A N/A Wound Status: No N/A N/A Wound Recurrence: 6.3x3.4x0.1 N/A N/A Measurements L x W x D (cm) 16.823 N/A N/A A (cm) : rea 1.682 N/A N/A Volume (cm) : -144.80% N/A N/A % Reduction in A rea: -144.80% N/A N/A % Reduction in Volume: Full Thickness Without Exposed N/A N/A Classification: Support Structures Medium N/A N/A Exudate A mount: Serosanguineous N/A N/A Exudate Type: red, brown N/A N/A Exudate Color: Medium (34-66%) N/A N/A Granulation A mount: Red N/A N/A Granulation Quality: Medium (34-66%) N/A N/A Necrotic A mount: Eschar, Adherent Slough  N/A N/A Necrotic Tissue: Fat Layer (Subcutaneous Tissue): Yes N/A N/A Exposed Structures: Fascia: No Tendon: No Muscle: No Joint: No Bone: No Small (1-33%) N/A N/A Epithelialization: Debridement - Selective/Open Wound N/A N/A Debridement: Pre-procedure Verification/Time Out 14:26 N/A N/A Taken: Lidocaine 5% topical ointment N/A N/A Pain Control: Necrotic/Eschar, Slough N/A N/A Tissue Debrided: Non-Viable Tissue N/A N/A Level: 21.42 N/A N/A Debridement A (sq cm): rea Curette N/A N/A Instrument: Minimum N/A N/A Bleeding: Pressure N/A N/A Hemostasis A chieved: Procedure was tolerated well N/A N/A Debridement Treatment Response: 6.3x3.4x0.1 N/A N/A Post Debridement Measurements L x W x D (cm) 1.682 N/A N/A Post Debridement Volume: (cm) Excoriation: No N/A N/A Periwound Skin Texture: Maceration: No N/A N/A Periwound Skin Moisture: Yes N/A N/A Tenderness on Palpation: Compression Therapy N/A N/A Procedures Performed: Debridement Treatment Notes Electronic Signature(s) TAELYR, JANTZ ([id number]) [id number] Page 4 of 7 Signed: 09/24/2022 3:11:44 PM By: Fredirick Maudlin MD FACS Entered By: Fredirick Maudlin on 09/24/2022 15:11:43 -------------------------------------------------------------------------------- Multi-Disciplinary Care Plan Details Patient Name: Date of Service: KAMYIAH, COLANTONIO 09/24/2022 1:45 PM Medical Record Number: [id number] Patient Account Number: [id number] Date of Birth/Sex: Treating RN: 04/21/1946 (76 y.o. [name] Primary Care Nichelle Renwick: [name] Other Clinician: Referring Sharnese Heath: Treating Shylo [name]/Extender: [name] Session in Treatment: 8 Active Inactive Wound/Skin Impairment Nursing Diagnoses: Impaired tissue integrity Goals: Ulcer/skin breakdown will have a volume reduction of 30% by week 4 Date Initiated: 08/03/2022 Date Inactivated: 09/10/2022 Target Resolution  Date: 08/31/2022 Unmet Reason: uncontrolled edema, Goal Status: Unmet acute infection Ulcer/skin breakdown will have a volume reduction of 50% by week 8 Date Initiated: 09/10/2022 Target Resolution Date: 10/08/2022 Goal Status: Active Interventions: Assess ulceration(s) every visit Treatment Activities: Skin care regimen initiated : 08/03/2022 Notes: Keystone ordered. 10/30 RX Levo started. 09/10/22 Electronic Signature(s) Signed: 09/24/2022 4:43:27 PM By: Blanche East RN Entered By: Blanche East on 09/24/2022 14:29:57 -------------------------------------------------------------------------------- Pain Assessment Details Patient Name: Date of Service: BETHLEHEM, LANGSTAFF 09/24/2022 1:45 PM Medical Record Number: [id number] Patient Account Number: [id number] Date of Birth/Sex: Treating RN: 1946/03/25 (76 y.o. [name] Primary Care Denvil Canning: [name] Other Clinician: Referring Shari Natt: Treating Janya Eveland/Extender: [name] Session in Treatment: 8 Active Problems Location of Pain Severity and Description of Pain Patient Has Paino Yes Site Locations Rate the pain. CECILIA, VANCLEVE ([id number]) [id number] Page 5 of 7 Rate the pain. Current Pain Level: 4 Pain Management and Medication Current Pain Management: Electronic Signature(s) Signed: 09/24/2022 4:43:27 PM By: Blanche East RN Entered By: Blanche East on 09/24/2022 14:13:46 -------------------------------------------------------------------------------- Patient/Caregiver Education Details Patient Name: Date of Service: [name] 11/20/2023andnbsp1:45 PM Medical Record  Number: [id number] Patient Account Number: [id number] Date of Birth/Gender: Treating RN: 17-Oct-1946 (76 y.o. [name] Primary Care Physician: [name] Other Clinician: Referring Physician: Treating Physician/Extender: [name] Session in  Treatment: 8 Education Assessment Education Provided To: Patient Education Topics Provided Wound/Skin Impairment: Methods: Explain/Verbal Responses: Reinforcements needed, State content correctly Electronic Signature(s) Signed: 09/24/2022 4:43:27 PM By: Blanche East RN Entered By: Blanche East on 09/24/2022 14:30:13 -------------------------------------------------------------------------------- Wound Assessment Details Patient Name: Date of Service: EMOGENE, MURATALLA 09/24/2022 1:45 PM Medical Record Number: [id number] Patient Account Number: [id number] Date of Birth/Sex: Treating RN: 1946-10-25 (76 y.o. [name] Primary Care Bradyn Vassey: [name] Other Clinician: Referring Dede Dobesh: Treating Amberleigh Gerken/Extender: Pixie Casino [place], [name] ([id number]) [id number] Page 6 of 7 Weeks in Treatment: 8 Wound Status Wound Number: 2 Primary Venous Leg Ulcer Etiology: Wound Location: Left, Posterior Lower Leg Wound Open Wounding Event: Blister Status: Date Acquired: 07/06/2022 Comorbid Cataracts, Asthma, Hypertension, Neuropathy, Received Weeks Of Treatment: 8 History: Chemotherapy, Received Radiation Clustered Wound: No Photos Wound Measurements Length: (cm) 6.3 Width: (cm) 3.4 Depth: (cm) 0.1 Area: (cm) 16.823 Volume: (cm) 1.682 % Reduction in Area: -144.8% % Reduction in Volume: -144.8% Epithelialization: Small (1-33%) Tunneling: No Undermining: No Wound Description Classification: Full Thickness Without Exposed Suppo Exudate Amount: Medium Exudate Type: Serosanguineous Exudate Color: red, brown rt Structures Foul Odor After Cleansing: No Slough/Fibrino Yes Wound Bed Granulation Amount: Medium (34-66%) Exposed Structure Granulation Quality: Red Fascia Exposed: No Necrotic Amount: Medium (34-66%) Fat Layer (Subcutaneous Tissue) Exposed: Yes Necrotic Quality: Eschar, Adherent Slough Tendon Exposed:  No Muscle Exposed: No Joint Exposed: No Bone Exposed: No Periwound Skin Texture Texture Color No Abnormalities Noted: No No Abnormalities Noted: No Excoriation: No Temperature / Pain Tenderness on Palpation: Yes Moisture No Abnormalities Noted: No Maceration: No Treatment Notes Wound #2 (Lower Leg) Wound Laterality: Left, Posterior Cleanser Soap and Water Discharge Instruction: May shower and wash wound with dial antibacterial soap and water prior to dressing change. Peri-Wound Care Sween Lotion (Moisturizing lotion) Discharge Instruction: Apply moisturizing lotion as directed Topical Keystone Discharge Instruction: Apply as directed to wound Primary Dressing Promogran Prisma Matrix, 4.34 (sq in) (silver collagen) Discharge Instruction: Moisten collagen with saline or hydrogel SENORA, LACSON ([id number]) [id number] Page 7 of 7 Secondary Dressing Zetuvit Plus Silicone Border Dressing 4x4 (in/in) Discharge Instruction: Apply silicone border over primary dressing as directed. Secured With [employer] Surgical T 2x10 (in/yd) ape Discharge Instruction: Secure with tape as directed. Compression Wrap ThreePress (3 layer compression wrap) Discharge Instruction: Apply three layer compression as directed. Compression Stockings Add-Ons Electronic Signature(s) Signed: 09/24/2022 4:43:27 PM By: Blanche East RN Entered By: Blanche East on 09/24/2022 14:21:55 -------------------------------------------------------------------------------- Vitals Details Patient Name: Date of Service: CHAKITA, MCGRAW 09/24/2022 1:45 PM Medical Record Number: [id number] Patient Account Number: [id number] Date of Birth/Sex: Treating RN: Oct 13, 1946 (76 y.o. [name], [name] Primary Care Senna Lape: [name] Other Clinician: Referring Madia Carvell: Treating Travares Nelles/Extender: [name] Session in Treatment: 8 Vital Signs Time Taken:  14:13 Temperature (F): 98.0 Height (in): 63 Pulse (bpm): 75 Weight (lbs): 200 Respiratory Rate (breaths/min): 18 Body Mass Index (BMI): 35.4 Blood Pressure (mmHg): 161/83 Reference Range: 80 - 120 mg / dl Electronic Signature(s) Signed: 09/24/2022 4:43:27 PM By: Blanche East RN Entered By: Blanche East on 09/24/2022 14:13:39

## 2022-09-24 NOTE — Progress Notes (Signed)
[name] ([id number]) [id number] Page 1 of 9 Visit Report for 09/24/2022 Chief Complaint Document Details Patient Name: Date of Service: [name], [name] 09/24/2022 1:45 PM Medical Record Number: [id number] Patient Account Number: [id number] Date of Birth/Sex: Treating RN: October 24, 1946 (76 y.o. F) Primary Care Provider: [name] Other Clinician: Referring Provider: Treating Provider/Extender: [name] Session in Treatment: 8 Information Obtained from: Patient Chief Complaint Patient presents for treatment of an open ulcer due to venous insufficiency Electronic Signature(s) Signed: 09/24/2022 3:11:50 PM By: Fredirick Maudlin MD FACS Entered By: Fredirick Maudlin on 09/24/2022 15:11:49 -------------------------------------------------------------------------------- Debridement Details Patient Name: Date of Service: [name], [name] 09/24/2022 1:45 PM Medical Record Number: [id number] Patient Account Number: [id number] Date of Birth/Sex: Treating RN: [birth year] (76 y.o. [name], [name] Primary Care Provider: [name] Other Clinician: Referring Provider: Treating Provider/Extender: [name] Session in Treatment: 8 Debridement Performed for Assessment: Wound #2 Left,Posterior Lower Leg Performed By: Physician Fredirick Maudlin, MD Debridement Type: Debridement Severity of Tissue Pre Debridement: Fat layer exposed Level of Consciousness (Pre-procedure): Awake and Alert Pre-procedure Verification/Time Out Yes - 14:26 Taken: Start Time: 14:27 Pain Control: Lidocaine 5% topical ointment T Area Debrided (L x W): otal 6.3 (cm) x 3.4 (cm) = 21.42 (cm) Tissue and other material debrided: Non-Viable, Eschar, Slough, Slough Level: Non-Viable Tissue Debridement Description: Selective/Open Wound Instrument: Curette Bleeding: Minimum Hemostasis Achieved: Pressure Response to Treatment: Procedure  was tolerated well Level of Consciousness (Post- Awake and Alert procedure): Post Debridement Measurements of Total Wound Length: (cm) 6.3 Width: (cm) 3.4 Depth: (cm) 0.1 Volume: (cm) 1.682 Character of Wound/Ulcer Post Debridement: Requires Further Debridement Severity of Tissue Post Debridement: Fat layer exposed Post Procedure Diagnosis Same as Pre-procedure [name], [name] ([id number]) [id number] Page 2 of 9 Notes Scribed for Dr. Celine Ahr by Blanche East, RN Electronic Signature(s) Signed: 09/24/2022 4:40:34 PM By: Fredirick Maudlin MD FACS Signed: 09/24/2022 4:43:27 PM By: Blanche East RN Entered By: Blanche East on 09/24/2022 14:29:34 -------------------------------------------------------------------------------- HPI Details Patient Name: Date of Service: [name] 09/24/2022 1:45 PM Medical Record Number: [id number] Patient Account Number: [id number] Date of Birth/Sex: Treating RN: 1946-08-28 (76 y.o. F) Primary Care Provider: [name] Other Clinician: Referring Provider: Treating Provider/Extender: [name] Session in Treatment: 8 History of Present Illness HPI Description: ADMISSION This is a 76 year old woman with a history of chronic venous insufficiency status post saphenous vein ablations in 2010 and 2016. 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 She presents today with an open venous ulcer on her left lower leg. It has been present for a little over 2 weeks. She saw her primary care provider who apparently swabbed the wound and grew out Pseudomonas. She just completed a course of ciprofloxacin for this. ABI was 0.91. She reports that she has had previous issues with ulcers in this same location, stemming back to a punch biopsy taken by a dermatologist many years ago. She has had several skin substitutes applied to the area that have ultimately resulted in healing on prior  occasions. She has 2 small ulcers on her left medial lower leg. There is slough accumulation in both of them. The more anterior of the 2 is quite small and has some soft slough on the surface, underneath which there is good granulation tissue. The more medial wound also has slough accumulation, but the underlying surface is fairly fibrotic and gritty. This is consistent with her provided history of multiple ulcers in the same location. 08/03/2022:  The anterior wound is smaller today with just a little bit of slough accumulation. The more medial wound continues to be very sensitive and fibrotic with slough buildup. 08/13/2022: The anterior wound is closed. The more medial wound remains sensitive with a fairly fibrotic surface. There is more granulation tissue filling in, however, and there is less slough than on prior occasions. 08/20/2022: The anterior wound remains closed. The more medial wound has filled with granulation tissue but still has a fair amount of slough on the surface and remains fairly tender. Unfortunately, she has developed 2 small ulcers just proximal to this. The fat layer is exposed in each. She says that over the weekend, she felt a burning sensation in that location. 08/27/2022: The 2 small wounds proximal to the main wound have merged into a single site. The wounds look a little bit dry, but they are quite clean without significant slough accumulation. They remain quite tender. 09/03/2022: All of the wounds have now merged into 1 large wound. There is a strong odor coming from the wound and the surface is not particularly viable. It is extremely painful for her today. 09/10/2022: The wound is less black and purple this week but still does not look particularly viable. The surface is desiccated. The culture that I took last week returned with a polymicrobial population including Pseudomonas. I prescribed both Augmentin and levofloxacin. She has not yet picked up levofloxacin, as  her pharmacy only notified her yesterday that it was ready. We have ordered a Keystone topical compounded antibiotic, but it has not yet come. 09/17/2022: The wound surface is markedly improved today. There is still an area of grayish-looking muscle but the rest appears significantly more viable. There is still a layer of slough on the surface. She still has a couple days left of oral antibiotic therapy. She has her [employer] compounded topical antibiotic with her today. 09/24/2022: She has completed her oral antibiotic therapy. The wound surface is much cleaner today and more viable without any necrotic tissue. It is a bit desiccated, however. Electronic Signature(s) Signed: 09/24/2022 3:12:31 PM By: Fredirick Maudlin MD FACS Entered By: Fredirick Maudlin on 09/24/2022 15:12:31 Physical Exam Details -------------------------------------------------------------------------------- [name] ([id number]) [id number] Page 3 of 9 Patient Name: Date of Service: [name], [name] 09/24/2022 1:45 PM Medical Record Number: [id number] Patient Account Number: [id number] Date of Birth/Sex: Treating RN: 05/27/1946 (76 y.o. F) Primary Care Provider: [name] Other Clinician: Referring Provider: Treating Provider/Extender: [name] Session in Treatment: 8 Constitutional She is hypertensive, but asymptomatic.. . . . No acute distress. Respiratory Normal work of breathing on room air. Notes 09/24/2022: The wound surface is much cleaner today and more viable without any necrotic tissue. It is a bit desiccated, however. Electronic Signature(s) Signed: 09/24/2022 3:13:26 PM By: Fredirick Maudlin MD FACS Entered By: Fredirick Maudlin on 09/24/2022 15:13:26 -------------------------------------------------------------------------------- Physician Orders Details Patient Name: Date of Service: [name], [name] 09/24/2022 1:45 PM Medical Record  Number: [id number] Patient Account Number: [id number] Date of Birth/Sex: Treating RN: Apr 24, 1946 (76 y.o. [name], [name] Primary Care Provider: [name] Other Clinician: Referring Provider: Treating Provider/Extender: [name] Session in Treatment: 8 Verbal / Phone Orders: No Diagnosis Coding ICD-10 Coding Code Description [phone] Non-pressure chronic ulcer of other part of left lower leg with fat layer exposed I83.893 Varicose veins of bilateral lower extremities with other complications E17.4 Venous insufficiency (chronic) (peripheral) G62.9 Polyneuropathy, unspecified R60.0 Localized edema Follow-up Appointments ppointment in 1 week. - Dr. Celine Ahr RM  4 Return A Anesthetic Wound #2 Left,Posterior Lower Leg (In clinic) Topical Lidocaine 5% applied to wound bed - in clinic, prior to debridement Bathing/ Shower/ Hygiene May shower with protection but do not get wound dressing(s) wet. Edema Control - Lymphedema / SCD / Other Left Lower Extremity Avoid standing for long periods of time. Patient to wear own compression stockings every day. - Right lower extremity Moisturize legs daily. Wound Treatment Wound #2 - Lower Leg Wound Laterality: Left, Posterior Cleanser: Soap and Water 1 x Per Week/30 Days Discharge Instructions: May shower and wash wound with dial antibacterial soap and water prior to dressing change. Peri-Wound Care: Sween Lotion (Moisturizing lotion) 1 x Per Week/30 Days Discharge Instructions: Apply moisturizing lotion as directed Topical: Keystone 1 x Per Week/30 Days FRANNIE, SHEDRICK ([id number]) [id number] Page 4 of 9 Discharge Instructions: Apply as directed to wound Prim Dressing: Promogran Prisma Matrix, 4.34 (sq in) (silver collagen) 1 x Per Week/30 Days ary Discharge Instructions: Moisten collagen with saline or hydrogel Secondary Dressing: Zetuvit Plus Silicone Border Dressing 4x4 (in/in) 1 x Per  Week/30 Days Discharge Instructions: Apply silicone border over primary dressing as directed. Secured With: 44M Medipore Public affairs consultant Surgical T 2x10 (in/yd) 1 x Per Week/30 Days ape Discharge Instructions: Secure with tape as directed. Compression Wrap: ThreePress (3 layer compression wrap) 1 x Per Week/30 Days Discharge Instructions: Apply three layer compression as directed. Electronic Signature(s) Signed: 09/24/2022 4:40:34 PM By: Fredirick Maudlin MD FACS Entered By: Fredirick Maudlin on 09/24/2022 15:13:38 -------------------------------------------------------------------------------- Problem List Details Patient Name: Date of Service: [name], [name] 09/24/2022 1:45 PM Medical Record Number: [id number] Patient Account Number: [id number] Date of Birth/Sex: Treating RN: 1946-01-02 (76 y.o. F) Primary Care Provider: [name] Other Clinician: Referring Provider: Treating Provider/Extender: [name] Session in Treatment: 8 Active Problems ICD-10 Encounter Code Description Active Date MDM Diagnosis [phone] Non-pressure chronic ulcer of other part of left lower leg with fat layer exposed 07/27/2022 No Yes I83.893 Varicose veins of bilateral lower extremities with other complications 07/06/5175 No Yes I87.2 Venous insufficiency (chronic) (peripheral) 07/27/2022 No Yes G62.9 Polyneuropathy, unspecified 07/27/2022 No Yes R60.0 Localized edema 07/27/2022 No Yes Inactive Problems Resolved Problems Electronic Signature(s) Signed: 09/24/2022 3:11:29 PM By: Fredirick Maudlin MD FACS Entered By: Fredirick Maudlin on 09/24/2022 15:11:29 [name] ([id number]) [id number] Page 5 of 9 -------------------------------------------------------------------------------- Progress Note Details Patient Name: Date of Service: [name], [name] 09/24/2022 1:45 PM Medical Record Number: [id number] Patient Account Number: [id number] Date of  Birth/Sex: Treating RN: [birth year] (76 y.o. F) Primary Care Provider: [name] Other Clinician: Referring Provider: Treating Provider/Extender: [name] Session in Treatment: 8 Subjective Chief Complaint Information obtained from Patient Patient presents for treatment of an open ulcer due to venous insufficiency History of Present Illness (HPI) ADMISSION This is a 76 year old woman with a history of chronic venous insufficiency status post saphenous vein ablations in 2010 and 2016. 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 She presents today with an open venous ulcer on her left lower leg. It has been present for a little over 2 weeks. She saw her primary care provider who apparently swabbed the wound and grew out Pseudomonas. She just completed a course of ciprofloxacin for this. ABI was 0.91. She reports that she has had previous issues with ulcers in this same location, stemming back to a punch biopsy taken by a dermatologist many years ago. She has had several skin substitutes applied to the area that have  ultimately resulted in healing on prior occasions. She has 2 small ulcers on her left medial lower leg. There is slough accumulation in both of them. The more anterior of the 2 is quite small and has some soft slough on the surface, underneath which there is good granulation tissue. The more medial wound also has slough accumulation, but the underlying surface is fairly fibrotic and gritty. This is consistent with her provided history of multiple ulcers in the same location. 08/03/2022: The anterior wound is smaller today with just a little bit of slough accumulation. The more medial wound continues to be very sensitive and fibrotic with slough buildup. 08/13/2022: The anterior wound is closed. The more medial wound remains sensitive with a fairly fibrotic surface. There is more granulation tissue filling in, however, and there is  less slough than on prior occasions. 08/20/2022: The anterior wound remains closed. The more medial wound has filled with granulation tissue but still has a fair amount of slough on the surface and remains fairly tender. Unfortunately, she has developed 2 small ulcers just proximal to this. The fat layer is exposed in each. She says that over the weekend, she felt a burning sensation in that location. 08/27/2022: The 2 small wounds proximal to the main wound have merged into a single site. The wounds look a little bit dry, but they are quite clean without significant slough accumulation. They remain quite tender. 09/03/2022: All of the wounds have now merged into 1 large wound. There is a strong odor coming from the wound and the surface is not particularly viable. It is extremely painful for her today. 09/10/2022: The wound is less black and purple this week but still does not look particularly viable. The surface is desiccated. The culture that I took last week returned with a polymicrobial population including Pseudomonas. I prescribed both Augmentin and levofloxacin. She has not yet picked up levofloxacin, as her pharmacy only notified her yesterday that it was ready. We have ordered a Keystone topical compounded antibiotic, but it has not yet come. 09/17/2022: The wound surface is markedly improved today. There is still an area of grayish-looking muscle but the rest appears significantly more viable. There is still a layer of slough on the surface. She still has a couple days left of oral antibiotic therapy. She has her [employer] compounded topical antibiotic with her today. 09/24/2022: She has completed her oral antibiotic therapy. The wound surface is much cleaner today and more viable without any necrotic tissue. It is a bit desiccated, however. Patient History Information obtained from Patient. Family History Cancer - Mother,Paternal Grandparents, Diabetes - Siblings, Heart Disease -  Father, Hypertension - Father. Social History Former smoker - quit in 1967. Medical History Eyes Patient has history of Cataracts - L eye Ear/Nose/Mouth/Throat Denies history of Chronic sinus problems/congestion, Middle ear problems Hematologic/Lymphatic Denies history of Anemia, Human Immunodeficiency Virus, Lymphedema Respiratory Patient has history of Asthma Cardiovascular Patient has history of Hypertension Endocrine Denies history of Type I Diabetes, Type II Diabetes Neurologic Patient has history of Neuropathy - Feet and finger Denies history of Seizure Disorder Oncologic [name], [name] ([id number]) [id number] Page 6 of 9 Patient has history of Received Chemotherapy - 2012, Received Radiation - 2012 Hospitalization/Surgery History - Lower extremity venography 2020. - Intravascular ultrasound. - peripheral vascular intervention. - melanoma 2011. Medical A Surgical History Notes nd Respiratory OSA on CPAP Cardiovascular Cronic venous insufficiency 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 Gastrointestinal liver cyst Musculoskeletal arthritis Neurologic restless leg  syndrome Objective Constitutional She is hypertensive, but asymptomatic.Marland Kitchen No acute distress. Vitals Time Taken: 2:13 PM, Height: 63 in, Weight: 200 lbs, BMI: 35.4, Temperature: 98.0 F, Pulse: 75 bpm, Respiratory Rate: 18 breaths/min, Blood Pressure: 161/83 mmHg. Respiratory Normal work of breathing on room air. General Notes: 09/24/2022: The wound surface is much cleaner today and more viable without any necrotic tissue. It is a bit desiccated, however. Integumentary (Hair, Skin) Wound #2 status is Open. Original cause of wound was Blister. The date acquired was: 07/06/2022. The wound has been in treatment 8 weeks. The wound is located on the Left,Posterior Lower Leg. The wound measures 6.3cm length x 3.4cm width x 0.1cm depth; 16.823cm^2 area and 1.682cm^3  volume. There is Fat Layer (Subcutaneous Tissue) exposed. There is no tunneling or undermining noted. There is a medium amount of serosanguineous drainage noted. There is medium (34-66%) red granulation within the wound bed. There is a medium (34-66%) amount of necrotic tissue within the wound bed including Eschar and Adherent Slough. The periwound skin appearance did not exhibit: Excoriation, Maceration. The periwound has tenderness on palpation. Assessment Active Problems ICD-10 Non-pressure chronic ulcer of other part of left lower leg with fat layer exposed Varicose veins of bilateral lower extremities with other complications Venous insufficiency (chronic) (peripheral) Polyneuropathy, unspecified Localized edema Procedures Wound #2 Pre-procedure diagnosis of Wound #2 is a Venous Leg Ulcer located on the Left,Posterior Lower Leg .Severity of Tissue Pre Debridement is: Fat layer exposed. There was a Selective/Open Wound Non-Viable Tissue Debridement with a total area of 21.42 sq cm performed by Fredirick Maudlin, MD. With the following instrument(s): Curette to remove Non-Viable tissue/material. Material removed includes Eschar and Slough and after achieving pain control using Lidocaine 5% topical ointment. No specimens were taken. A time out was conducted at 14:26, prior to the start of the procedure. A Minimum amount of bleeding was controlled with Pressure. The procedure was tolerated well. Post Debridement Measurements: 6.3cm length x 3.4cm width x 0.1cm depth; 1.682cm^3 volume. Character of Wound/Ulcer Post Debridement requires further debridement. Severity of Tissue Post Debridement is: Fat layer exposed. Post procedure Diagnosis Wound #2: Same as Pre-Procedure General Notes: Scribed for Dr. Celine Ahr by Blanche East, RN. Pre-procedure diagnosis of Wound #2 is a Venous Leg Ulcer located on the Left,Posterior Lower Leg . There was a Three Layer Compression Therapy Procedure by Blanche East, RN. Post procedure Diagnosis Wound #2: Same as Pre-Procedure [name], [name] ([id number]) [id number] Page 7 of 9 Plan Follow-up Appointments: Return Appointment in 1 week. - Dr. Celine Ahr RM 4 Anesthetic: Wound #2 Left,Posterior Lower Leg: (In clinic) Topical Lidocaine 5% applied to wound bed - in clinic, prior to debridement Bathing/ Shower/ Hygiene: May shower with protection but do not get wound dressing(s) wet. Edema Control - Lymphedema / SCD / Other: Avoid standing for long periods of time. Patient to wear own compression stockings every day. - Right lower extremity Moisturize legs daily. WOUND #2: - Lower Leg Wound Laterality: Left, Posterior Cleanser: Soap and Water 1 x Per Week/30 Days Discharge Instructions: May shower and wash wound with dial antibacterial soap and water prior to dressing change. Peri-Wound Care: Sween Lotion (Moisturizing lotion) 1 x Per Week/30 Days Discharge Instructions: Apply moisturizing lotion as directed Topical: Keystone 1 x Per Week/30 Days Discharge Instructions: Apply as directed to wound Prim Dressing: Promogran Prisma Matrix, 4.34 (sq in) (silver collagen) 1 x Per Week/30 Days ary Discharge Instructions: Moisten collagen with saline or hydrogel Secondary Dressing: Zetuvit Plus Silicone Border Dressing  4x4 (in/in) 1 x Per Week/30 Days Discharge Instructions: Apply silicone border over primary dressing as directed. Secured With: 69M Medipore Public affairs consultant Surgical T 2x10 (in/yd) 1 x Per Week/30 Days ape Discharge Instructions: Secure with tape as directed. Com pression Wrap: ThreePress (3 layer compression wrap) 1 x Per Week/30 Days Discharge Instructions: Apply three layer compression as directed. 09/24/2022: The wound surface is much cleaner today and more viable without any necrotic tissue. It is a bit desiccated, however. I used a curette to debride eschar and slough from the wound. We are going to use Prisma  silver collagen but moisten it with her [employer] antibiotic compound in saline, rather than plain saline. Continue 3 layer compression. Follow-up in 1 week. Electronic Signature(s) Signed: 09/24/2022 3:18:28 PM By: Fredirick Maudlin MD FACS Entered By: Fredirick Maudlin on 09/24/2022 15:18:28 -------------------------------------------------------------------------------- HxROS Details Patient Name: Date of Service: [name], [name] 09/24/2022 1:45 PM Medical Record Number: [id number] Patient Account Number: [id number] Date of Birth/Sex: Treating RN: Oct 09, 1946 (76 y.o. F) Primary Care Provider: [name] Other Clinician: Referring Provider: Treating Provider/Extender: [name] Session in Treatment: 8 Information Obtained From Patient Eyes Medical History: Positive for: Cataracts - L eye Ear/Nose/Mouth/Throat Medical History: Negative for: Chronic sinus problems/congestion; Middle ear problems Hematologic/Lymphatic Medical History: Negative for: Anemia; Human Immunodeficiency Virus; Lymphedema Respiratory Medical HistorySHAMIRACLE, GORDEN ([id number]) [id number] Page 8 of 9 Positive for: Asthma Past Medical History Notes: OSA on CPAP Cardiovascular Medical History: Positive for: Hypertension Past Medical History Notes: Cronic venous insufficiency 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 Gastrointestinal Medical History: Past Medical History Notes: liver cyst Endocrine Medical History: Negative for: Type I Diabetes; Type II Diabetes Musculoskeletal Medical History: Past Medical History Notes: arthritis Neurologic Medical History: Positive for: Neuropathy - Feet and finger Negative for: Seizure Disorder Past Medical History Notes: restless leg syndrome Oncologic Medical History: Positive for: Received Chemotherapy - 2012; Received Radiation - 2012 HBO Extended History  Items Eyes: Cataracts Immunizations Pneumococcal Vaccine: Received Pneumococcal Vaccination: Yes Received Pneumococcal Vaccination On or After 60th Birthday: Yes Implantable Devices No devices added Hospitalization / Surgery History Type of Hospitalization/Surgery Lower extremity venography 2020 Intravascular ultrasound peripheral vascular intervention melanoma 2011 Family and Social History Cancer: Yes - Mother,Paternal Grandparents; Diabetes: Yes - Siblings; Heart Disease: Yes - Father; Hypertension: Yes - Father; Former smoker - quit in [employer]) Signed: 09/24/2022 4:40:34 PM By: Fredirick Maudlin MD FACS Entered By: Fredirick Maudlin on 09/24/2022 15:12:48 JEANIA, NATER ([id number]) [id number] Page 9 of 9 -------------------------------------------------------------------------------- SuperBill Details Patient Name: Date of Service: KAYTIE, RATCLIFFE 09/24/2022 Medical Record Number: [id number] Patient Account Number: [id number] Date of Birth/Sex: Treating RN: [birth year] (76 y.o. F) Primary Care Provider: [name] Other Clinician: Referring Provider: Treating Provider/Extender: [name] Session in Treatment: 8 Diagnosis Coding ICD-10 Codes Code Description [phone] Non-pressure chronic ulcer of other part of left lower leg with fat layer exposed I83.893 Varicose veins of bilateral lower extremities with other complications H85.2 Venous insufficiency (chronic) (peripheral) G62.9 Polyneuropathy, unspecified R60.0 Localized edema Facility Procedures : CPT4 Code: [id number] Description: 36144 - DEBRIDE WOUND 1ST 20 SQ CM OR < ICD-10 Diagnosis Description L97.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d Modifier: Quantity: 1 : CPT4 Code: [id number] Description: 76195 - DEBRIDE WOUND EA ADDL 20 SQ CM ICD-10 Diagnosis Description L97.822 Non-pressure chronic  ulcer of other part of left lower leg with fat layer exposed Modifier: Quantity: 1 Physician Procedures :  CPT4 Code Description Modifier [id number] 84784 - WC PHYS LEVEL 3 - EST PT 25 ICD-10 Diagnosis Description L97.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d I87.2 Venous insufficiency (chronic) (peripheral) X28.208  Varicose veins of bilateral lower extremities with other complications H38.8 Localized edema Quantity: 1 : [id number] 97597 - WC PHYS DEBR WO ANESTH 20 SQ CM ICD-10 Diagnosis Description L97.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d Quantity: 1 : [id number] 58682 - WC PHYS DEBR WO ANESTH EA ADD 20 CM ICD-10 Diagnosis Description L97.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d Quantity: 1 Electronic Signature(s) Signed: 09/24/2022 3:18:57 PM By: Fredirick Maudlin MD FACS Entered By: Fredirick Maudlin on 09/24/2022 15:18:57

## 2022-10-01 ENCOUNTER — Encounter (HOSPITAL_BASED_OUTPATIENT_CLINIC_OR_DEPARTMENT_OTHER): Payer: Medicare Other | Admitting: General Surgery

## 2022-10-01 DIAGNOSIS — I83891 Varicose veins of right lower extremities with other complications: Secondary | ICD-10-CM | POA: Diagnosis not present

## 2022-10-01 DIAGNOSIS — R600 Localized edema: Secondary | ICD-10-CM | POA: Diagnosis not present

## 2022-10-01 DIAGNOSIS — L97222 Non-pressure chronic ulcer of left calf with fat layer exposed: Secondary | ICD-10-CM | POA: Diagnosis not present

## 2022-10-01 DIAGNOSIS — I872 Venous insufficiency (chronic) (peripheral): Secondary | ICD-10-CM | POA: Diagnosis not present

## 2022-10-01 DIAGNOSIS — I83028 Varicose veins of left lower extremity with ulcer other part of lower leg: Secondary | ICD-10-CM | POA: Diagnosis not present

## 2022-10-01 DIAGNOSIS — L97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d: Secondary | ICD-10-CM | POA: Diagnosis not present

## 2022-10-01 DIAGNOSIS — G629 Polyneuropathy, unspecified: Secondary | ICD-10-CM | POA: Diagnosis not present

## 2022-10-01 NOTE — Progress Notes (Addendum)
[name] ([id number]) [id number] Page 1 of 8 Visit Report for 10/01/2022 Arrival Information Details Patient Name: Date of Service: [name], [name] 10/01/2022 1:45 PM Medical Record Number: [id number] Patient Account Number: [id number] Date of Birth/Sex: Treating RN: 1945/11/21 (77 y.o. [name], [name] Janiel Derhammer: [name] Other Clinician: Referring [name]: Treating [name]/Extender: [name] Session in Treatment: 9 Visit Information History Since Last Visit Added or deleted any medications: No Patient Arrived: Ambulatory Any new allergies or adverse reactions: No Arrival Time: 14:03 Had a fall or experienced change in No Accompanied By: [id number] activities of daily living that may affect Transfer Assistance: None risk of falls: Patient Requires Transmission-Based Precautions: No Signs or symptoms of abuse/neglect since last visito No Patient Has Alerts: No Hospitalized since last visit: No Implantable device outside of the clinic excluding No cellular tissue based products placed in the center since last visit: Has Compression in Place as Prescribed: Yes Pain Present Now: No Electronic Signature(s) Signed: 10/01/2022 4:55:50 PM By: Blanche East RN Entered By: Blanche East on 10/01/2022 14:05:11 -------------------------------------------------------------------------------- Compression Therapy Details Patient Name: Date of Service: [name], [name] 10/01/2022 1:45 PM Medical Record Number: [id number] Patient Account Number: [id number] Date of Birth/Sex: Treating RN: Sep 24, 1946 (76 y.o. [name] Primary [name] [name]: [name] Other Clinician: Referring [name]: Treating [name]/Extender: [name] Session in Treatment: 9 Compression Therapy Performed for Wound Assessment: Wound #2 Left,Posterior Lower Leg Performed By: Clinician Blanche East,  RN Compression Type: Rolena Infante Post Procedure Diagnosis Same as Pre-procedure Electronic Signature(s) Signed: 10/04/2022 7:35:29 PM By: Blanche East RN Entered By: Blanche East on 10/01/2022 16:56:51 [name] ([id number]) [id number] Page 2 of 8 -------------------------------------------------------------------------------- Encounter Discharge Information Details Patient Name: Date of Service: [name], [name] 10/01/2022 1:45 PM Medical Record Number: [id number] Patient Account Number: [id number] Date of Birth/Sex: Treating RN: 1946-10-04 (76 y.o. [name] Primary [name] [name]: [name] Other Clinician: Referring [name]: Treating [name]/Extender: [name] Session in Treatment: 9 Encounter Discharge Information Items Post Procedure Vitals Discharge Condition: Stable Temperature (F): 98.7 Ambulatory Status: Ambulatory Pulse (bpm): 82 Discharge Destination: Home Respiratory Rate (breaths/min): 16 Transportation: Private Auto Blood Pressure (mmHg): 189/81 Accompanied By: self Schedule Follow-up Appointment: Yes Clinical Summary of [name]: Electronic Signature(s) Signed: 10/01/2022 4:55:50 PM By: Blanche East RN Entered By: Blanche East on 10/01/2022 14:44:30 -------------------------------------------------------------------------------- Lower Extremity Assessment Details Patient Name: Date of Service: [name], [name] 10/01/2022 1:45 PM Medical Record Number: [id number] Patient Account Number: [id number] Date of Birth/Sex: Treating RN: 31-Mar-1946 (76 y.o. [name] Primary [name] Darria [name]: [name] Other Clinician: Referring [name]: Treating [name]/Extender: [name] in Treatment: 9 Edema Assessment Assessed: [name]: No] [Right: No] [Left: Edema] [Right: :] Calf Left: Right: Point of Measurement: From Medial Instep 45 cm Ankle Left:  Right: Point of Measurement: From Medial Instep 23.4 cm Vascular Assessment Pulses: Dorsalis Pedis Palpable: [Left:Yes] Electronic Signature(s) Signed: 10/01/2022 4:55:50 PM By: Blanche East RN Entered By: Blanche East on 10/01/2022 14:08:38 Multi Wound Chart Details -------------------------------------------------------------------------------- [name] ([id number]) [id number] Page 3 of 8 Patient Name: Date of Service: [name], [name] 10/01/2022 1:45 PM Medical Record Number: [id number] Patient Account Number: [id number] Date of Birth/Sex: Treating RN: May 27, 1946 (75 y.o. F) Primary [name] [name]: [name] Other Clinician: Referring [name]: Treating [name]/Extender: [name] Session in Treatment: 9 Vital Signs Height(in): 63 Pulse(bpm): 82 Weight(lbs): 200 Blood Pressure(mmHg): 189/81 Body Mass Index(BMI): 35.4 Temperature(F): 98.7 Respiratory Rate(breaths/min): 16 Wound  Assessments Wound Number: 2 N/A N/A Photos: N/A N/A Left, Posterior Lower Leg N/A N/A Wound Location: Blister N/A N/A Wounding Event: Venous Leg Ulcer N/A N/A Primary Etiology: Cataracts, Asthma, Hypertension, N/A N/A Comorbid History: Neuropathy, Received Chemotherapy, Received Radiation 07/06/2022 N/A N/A Date Acquired: 9 N/A N/A [name] of Treatment: Open N/A N/A Wound Status: No N/A N/A Wound Recurrence: 6x4x0.1 N/A N/A Measurements L x W x D (cm) 18.85 N/A N/A A (cm) : rea 1.885 N/A N/A Volume (cm) : -174.30% N/A N/A % Reduction in A rea: -174.40% N/A N/A % Reduction in Volume: Full Thickness Without Exposed N/A N/A Classification: Support Structures Medium N/A N/A Exudate A mount: Serosanguineous N/A N/A Exudate Type: red, brown N/A N/A Exudate Color: Flat and Intact N/A N/A Wound Margin: Medium (34-66%) N/A N/A Granulation A mount: Red N/A N/A Granulation Quality: Medium (34-66%) N/A  N/A Necrotic A mount: Eschar, Adherent Slough N/A N/A Necrotic Tissue: Fat Layer (Subcutaneous Tissue): Yes N/A N/A Exposed Structures: Fascia: No Tendon: No Muscle: No Joint: No Bone: No Small (1-33%) N/A N/A Epithelialization: Debridement - Selective/Open Wound N/A N/A Debridement: Pre-procedure Verification/Time Out 14:25 N/A N/A Taken: Lidocaine 4% Topical Solution N/A N/A Pain Control: Necrotic/Eschar, Slough N/A N/A Tissue Debrided: Non-Viable Tissue N/A N/A Level: 24 N/A N/A Debridement A (sq cm): rea Curette N/A N/A Instrument: Minimum N/A N/A Bleeding: Pressure N/A N/A Hemostasis A chieved: Procedure was tolerated well N/A N/A Debridement Treatment Response: 6x4x0.1 N/A N/A Post Debridement Measurements L x W x D (cm) 1.885 N/A N/A Post Debridement Volume: (cm) Excoriation: No N/A N/A Periwound Skin Texture: Maceration: No N/A N/A Periwound Skin Moisture: No Abnormalities Noted N/A N/A Periwound Skin Color: Yes N/A N/A Tenderness on Palpation: Debridement N/A N/A Procedures Performed: Treatment Notes Wound #2 (Lower Leg) Wound Laterality: Left, Posterior HIDAYA, DANIEL ([id number]) [id number] Page 4 of 8 Cleanser Soap and Water Discharge Instruction: May shower and wash wound with dial antibacterial soap and water prior to dressing change. Peri-Wound [name] Sween Lotion (Moisturizing lotion) Discharge Instruction: Apply moisturizing lotion as directed Topical Keystone Discharge Instruction: Apply as directed to wound Primary Dressing Promogran Prisma Matrix, 4.34 (sq in) (silver collagen) Discharge Instruction: Moisten collagen with saline or hydrogel Secondary Dressing Zetuvit Plus Silicone Border Dressing 4x4 (in/in) Discharge Instruction: Apply silicone border over primary dressing as directed. Secured With [employer] Surgical T 2x10 (in/yd) ape Discharge Instruction: Secure with tape as  directed. Compression Wrap Unnaboot w/Calamine, 4x10 (in/yd) Discharge Instruction: Apply Unnaboot as directed. Compression Stockings Add-Ons Electronic Signature(s) Signed: 10/01/2022 2:53:12 PM By: Fredirick Maudlin MD FACS Entered By: Fredirick Maudlin on 10/01/2022 14:53:12 -------------------------------------------------------------------------------- Multi-Disciplinary [name] Plan Details Patient Name: Date of Service: AELLA, RONDA 10/01/2022 1:45 PM Medical Record Number: [id number] Patient Account Number: [id number] Date of Birth/Sex: Treating RN: 02-25-1946 (76 y.o. [name] Primary [name] Fender Herder: [name] Other Clinician: Referring Kristion Holifield: Treating Yosgar Demirjian/Extender: [name] Session in Treatment: 9 Active Inactive Wound/Skin Impairment Nursing Diagnoses: Impaired tissue integrity Goals: Ulcer/skin breakdown will have a volume reduction of 30% by week 4 Date Initiated: 08/03/2022 Date Inactivated: 09/10/2022 Target Resolution Date: 08/31/2022 Unmet Reason: uncontrolled edema, Goal Status: Unmet acute infection Ulcer/skin breakdown will have a volume reduction of 50% by week 8 Date Initiated: 09/10/2022 Target Resolution Date: 10/08/2022 Goal Status: Active Interventions: Assess ulceration(s) every visit MILLENIA, WALDVOGEL ([id number]) [phone].pdf Page 5 of 8 Treatment Activities: Skin [name] regimen initiated : 08/03/2022 Notes: Keystone ordered. 10/30 RX Levo started. 09/10/22 Electronic Signature(s)  Signed: 10/01/2022 4:55:50 PM By: Blanche East RN Entered By: Blanche East on 10/01/2022 14:29:05 -------------------------------------------------------------------------------- Pain Assessment Details Patient Name: Date of Service: NIYAH, MAMARIL 10/01/2022 1:45 PM Medical Record Number: [id number] Patient Account Number: [id number] Date of Birth/Sex: Treating RN: 1946/06/07 (76 y.o. [name] Primary [name] Milessa Hogan: [name] Other Clinician: Referring Sunnie Odden: Treating Saina Waage/Extender: [name] Session in Treatment: 9 Active Problems Location of Pain Severity and Description of Pain Patient Has Paino No Site Locations Rate the pain. Current Pain Level: 0 Pain Management and Medication Current Pain Management: Electronic Signature(s) Signed: 10/01/2022 4:55:50 PM By: Blanche East RN Entered By: Blanche East on 10/01/2022 14:08:25 -------------------------------------------------------------------------------- Patient/Caregiver Education Details Patient Name: Date of Service: YATZIRY, [name] 11/27/2023andnbsp1:45 PM Medical Record Number: [id number] Patient Account Number: [id number] Date of Birth/Gender: Treating RN: [birth year] (76 y.o. [name] Primary [name] Physician: [name] Other Clinician: Referring Physician: Treating Physician/Extender: [name] Session in Treatment: [address] [name] ([id number]) [id number] Page 6 of 8 Education Assessment Education Provided To: Patient Education Topics Provided Wound/Skin Impairment: Methods: Explain/Verbal Responses: Reinforcements needed, State content correctly Electronic Signature(s) Signed: 10/01/2022 4:55:50 PM By: Blanche East RN Entered By: Blanche East on 10/01/2022 14:16:14 -------------------------------------------------------------------------------- Wound Assessment Details Patient Name: Date of Service: [name], [name] 10/01/2022 1:45 PM Medical Record Number: [id number] Patient Account Number: [id number] Date of Birth/Sex: Treating RN: 04/01/1946 (76 y.o. [name], [name] Alvey Brockel: [name] Other Clinician: Referring Pietra Zuluaga: Treating Cincere Deprey/Extender: [name] in Treatment: 9 Wound Status Wound Number: 2 Primary Venous Leg  Ulcer Etiology: Wound Location: Left, Posterior Lower Leg Wound Open Wounding Event: Blister Status: Date Acquired: 07/06/2022 Comorbid Cataracts, Asthma, Hypertension, Neuropathy, Received [name] Of Treatment: 9 History: Chemotherapy, Received Radiation Clustered Wound: No Photos Wound Measurements Length: (cm) 6 Width: (cm) 4 Depth: (cm) 0.1 Area: (cm) 18.85 Volume: (cm) 1.885 % Reduction in Area: -174.3% % Reduction in Volume: -174.4% Epithelialization: Small (1-33%) Tunneling: No Undermining: No Wound Description Classification: Full Thickness Without Exposed Suppor Wound Margin: Flat and Intact Exudate Amount: Medium Exudate Type: Serosanguineous Exudate Color: red, brown t Structures Foul Odor After Cleansing: No Slough/Fibrino Yes Wound Bed Granulation Amount: Medium (34-66%) Exposed Structure Granulation Quality: Red Fascia Exposed: No CRYSTEN, KAMAN ([id number]) [id number] Page 7 of 8 Necrotic Amount: Medium (34-66%) Fat Layer (Subcutaneous Tissue) Exposed: Yes Necrotic Quality: Eschar, Adherent Slough Tendon Exposed: No Muscle Exposed: No Joint Exposed: No Bone Exposed: No Periwound Skin Texture Texture Color No Abnormalities Noted: No No Abnormalities Noted: Yes Excoriation: No Temperature / Pain Tenderness on Palpation: Yes Moisture No Abnormalities Noted: No Maceration: No Treatment Notes Wound #2 (Lower Leg) Wound Laterality: Left, Posterior Cleanser Soap and Water Discharge Instruction: May shower and wash wound with dial antibacterial soap and water prior to dressing change. Peri-Wound [name] Sween Lotion (Moisturizing lotion) Discharge Instruction: Apply moisturizing lotion as directed Topical Keystone Discharge Instruction: Apply as directed to wound Primary Dressing Promogran Prisma Matrix, 4.34 (sq in) (silver collagen) Discharge Instruction: Moisten collagen with saline or hydrogel Secondary  Dressing Zetuvit Plus Silicone Border Dressing 4x4 (in/in) Discharge Instruction: Apply silicone border over primary dressing as directed. Secured With [employer] Surgical T 2x10 (in/yd) ape Discharge Instruction: Secure with tape as directed. Compression Wrap Unnaboot w/Calamine, 4x10 (in/yd) Discharge Instruction: Apply Unnaboot as directed. Compression Stockings Add-Ons Electronic Signature(s) Signed: 10/01/2022 4:55:50 PM By: Blanche East RN Entered By: Blanche East on 10/01/2022 14:13:25 -------------------------------------------------------------------------------- Vitals Details Patient  Name: Date of Service: [name], [name] 10/01/2022 1:45 PM Medical Record Number: [id number] Patient Account Number: [id number] Date of Birth/Sex: Treating RN: [birth year] (76 y.o. [name] Primary [name] Deauna Yaw: [name] Other Clinician: Referring Akili Corsetti: Treating Cashlyn Huguley/Extender: [name] in Treatment: 9 Vital Signs Time Taken: 14:05 Temperature (F): 98.7 ANNE, BOLTZ ([id number]) [id number] Page 8 of 8 Height (in): 63 Pulse (bpm): 82 Weight (lbs): 200 Respiratory Rate (breaths/min): 16 Body Mass Index (BMI): 35.4 Blood Pressure (mmHg): 189/81 Reference Range: 80 - 120 mg / dl Electronic Signature(s) Signed: 10/01/2022 4:55:50 PM By: Blanche East RN Entered By: Blanche East on 10/01/2022 14:05:27

## 2022-10-01 NOTE — Progress Notes (Signed)
HAYLY, LITSEY ([id number]) [id number] Page 1 of 9 Visit Report for 10/01/2022 Chief Complaint Document Details Patient Name: Date of Service: [name], [name] 10/01/2022 1:45 PM Medical Record Number: [id number] Patient Account Number: [id number] Date of Birth/Sex: Treating RN: [birth year] (76 y.o. F) Primary Care Provider: [name] Other Clinician: Referring Provider: Treating Provider/Extender: [name] Session in Treatment: 9 Information Obtained from: Patient Chief Complaint Patient presents for treatment of an open ulcer due to venous insufficiency Electronic Signature(s) Signed: 10/01/2022 2:53:19 PM By: Fredirick Maudlin MD FACS Entered By: Fredirick Maudlin on 10/01/2022 14:53:19 -------------------------------------------------------------------------------- Debridement Details Patient Name: Date of Service: [name], [name] 10/01/2022 1:45 PM Medical Record Number: [id number] Patient Account Number: [id number] Date of Birth/Sex: Treating RN: July 29, 1946 (76 y.o. [name] Primary Care Provider: [name] Other Clinician: Referring Provider: Treating Provider/Extender: [name] Session in Treatment: 9 Debridement Performed for Assessment: Wound #2 Left,Posterior Lower Leg Performed By: Physician Fredirick Maudlin, MD Debridement Type: Debridement Severity of Tissue Pre Debridement: Fat layer exposed Level of Consciousness (Pre-procedure): Awake and Alert Pre-procedure Verification/Time Out Yes - 14:25 Taken: Start Time: 14:26 Pain Control: Lidocaine 4% Topical Solution T Area Debrided (L x W): otal 6 (cm) x 4 (cm) = 24 (cm) Tissue and other material debrided: Non-Viable, Eschar, Slough, Slough Level: Non-Viable Tissue Debridement Description: Selective/Open Wound Instrument: Curette Bleeding: Minimum Hemostasis Achieved: Pressure Response to Treatment: Procedure was  tolerated well Level of Consciousness (Post- Awake and Alert procedure): Post Debridement Measurements of Total Wound Length: (cm) 6 Width: (cm) 4 Depth: (cm) 0.1 Volume: (cm) 1.885 Character of Wound/Ulcer Post Debridement: Requires Further Debridement Severity of Tissue Post Debridement: Fat layer exposed Post Procedure Diagnosis Same as Pre-procedure [name], [name] ([id number]) [id number] Page 2 of 9 Notes Scribed for Dr. Celine Ahr by Blanche East, RN Electronic Signature(s) Signed: 10/01/2022 4:55:50 PM By: Blanche East RN Signed: 10/01/2022 4:56:20 PM By: Fredirick Maudlin MD FACS Entered By: Blanche East on 10/01/2022 14:43:23 -------------------------------------------------------------------------------- HPI Details Patient Name: Date of Service: [name], [name] 10/01/2022 1:45 PM Medical Record Number: [id number] Patient Account Number: [id number] Date of Birth/Sex: Treating RN: 1946-09-02 (76 y.o. F) Primary Care Provider: [name] Other Clinician: Referring Provider: Treating Provider/Extender: [name] Session in Treatment: 9 History of Present Illness HPI Description: ADMISSION This is a 76 year old woman with a history of chronic venous insufficiency status post saphenous vein ablations in 2010 and 2016. 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 She presents today with an open venous ulcer on her left lower leg. It has been present for a little over 2 weeks. She saw her primary care provider who apparently swabbed the wound and grew out Pseudomonas. She just completed a course of ciprofloxacin for this. ABI was 0.91. She reports that she has had previous issues with ulcers in this same location, stemming back to a punch biopsy taken by a dermatologist many years ago. She has had several skin substitutes applied to the area that have ultimately resulted in healing on prior  occasions. She has 2 small ulcers on her left medial lower leg. There is slough accumulation in both of them. The more anterior of the 2 is quite small and has some soft slough on the surface, underneath which there is good granulation tissue. The more medial wound also has slough accumulation, but the underlying surface is fairly fibrotic and gritty. This is consistent with her provided history of multiple ulcers in the same location. 08/03/2022:  The anterior wound is smaller today with just a little bit of slough accumulation. The more medial wound continues to be very sensitive and fibrotic with slough buildup. 08/13/2022: The anterior wound is closed. The more medial wound remains sensitive with a fairly fibrotic surface. There is more granulation tissue filling in, however, and there is less slough than on prior occasions. 08/20/2022: The anterior wound remains closed. The more medial wound has filled with granulation tissue but still has a fair amount of slough on the surface and remains fairly tender. Unfortunately, she has developed 2 small ulcers just proximal to this. The fat layer is exposed in each. She says that over the weekend, she felt a burning sensation in that location. 08/27/2022: The 2 small wounds proximal to the main wound have merged into a single site. The wounds look a little bit dry, but they are quite clean without significant slough accumulation. They remain quite tender. 09/03/2022: All of the wounds have now merged into 1 large wound. There is a strong odor coming from the wound and the surface is not particularly viable. It is extremely painful for her today. 09/10/2022: The wound is less black and purple this week but still does not look particularly viable. The surface is desiccated. The culture that I took last week returned with a polymicrobial population including Pseudomonas. I prescribed both Augmentin and levofloxacin. She has not yet picked up levofloxacin, as  her pharmacy only notified her yesterday that it was ready. We have ordered a Keystone topical compounded antibiotic, but it has not yet come. 09/17/2022: The wound surface is markedly improved today. There is still an area of grayish-looking muscle but the rest appears significantly more viable. There is still a layer of slough on the surface. She still has a couple days left of oral antibiotic therapy. She has her [employer] compounded topical antibiotic with her today. 09/24/2022: She has completed her oral antibiotic therapy. The wound surface is much cleaner today and more viable without any necrotic tissue. It is a bit desiccated, however. 10/01/2022: The moisture balance of the wound has improved. There is a layer of yellow slough on the surface, but beneath this, the wound is more pink. Electronic Signature(s) Signed: 10/01/2022 2:53:49 PM By: Fredirick Maudlin MD FACS Entered By: Fredirick Maudlin on 10/01/2022 14:53:49 Boulos, [name] ([id number]) [id number] Page 3 of 9 -------------------------------------------------------------------------------- Physical Exam Details Patient Name: Date of Service: [name], [name] 10/01/2022 1:45 PM Medical Record Number: [id number] Patient Account Number: [id number] Date of Birth/Sex: Treating RN: 1946-03-19 (76 y.o. F) Primary Care Provider: [name] Other Clinician: Referring Provider: Treating Provider/Extender: [name] Session in Treatment: 9 Constitutional She is hypertensive, but asymptomatic.. . . . No acute distress. Respiratory Normal work of breathing on room air. Notes 10/01/2022: The moisture balance of the wound has improved. There is a layer of yellow slough on the surface, but beneath this, the wound is more pink. Electronic Signature(s) Signed: 10/01/2022 2:54:17 PM By: Fredirick Maudlin MD FACS Entered By: Fredirick Maudlin on 10/01/2022  14:54:17 -------------------------------------------------------------------------------- Physician Orders Details Patient Name: Date of Service: [name], [name] 10/01/2022 1:45 PM Medical Record Number: [id number] Patient Account Number: [id number] Date of Birth/Sex: Treating RN: [birth year] (76 y.o. [name] Primary Care Provider: [name] Other Clinician: Referring Provider: Treating Provider/Extender: [name] Session in Treatment: 9 Verbal / Phone Orders: No Diagnosis Coding ICD-10 Coding Code Description [phone] Non-pressure chronic ulcer of other part of left lower leg with fat  layer exposed I96.789 Varicose veins of bilateral lower extremities with other complications F81.0 Venous insufficiency (chronic) (peripheral) G62.9 Polyneuropathy, unspecified R60.0 Localized edema Follow-up Appointments ppointment in 1 week. - Dr. Celine Ahr RM 4 Return A Anesthetic Wound #2 Left,Posterior Lower Leg (In clinic) Topical Lidocaine 5% applied to wound bed - in clinic, prior to debridement Bathing/ Shower/ Hygiene May shower with protection but do not get wound dressing(s) wet. Edema Control - Lymphedema / SCD / Other Left Lower Extremity Avoid standing for long periods of time. Patient to wear own compression stockings every day. - Right lower extremity Moisturize legs daily. Wound Treatment Wound #2 - Lower Leg Wound Laterality: Left, Posterior Cleanser: Soap and Water 1 x Per Week/30 Days Discharge Instructions: May shower and wash wound with dial antibacterial soap and water prior to dressing change. Peri-Wound Care: Sween Lotion (Moisturizing lotion) 1 x Per Week/30 Days MAKENLY, LARABEE ([id number]) [id number] Page 4 of 9 Discharge Instructions: Apply moisturizing lotion as directed Topical: Keystone 1 x Per Week/30 Days Discharge Instructions: Apply as directed to wound Prim Dressing: Promogran Prisma Matrix,  4.34 (sq in) (silver collagen) 1 x Per Week/30 Days ary Discharge Instructions: Moisten collagen with saline or hydrogel Secondary Dressing: Zetuvit Plus Silicone Border Dressing 4x4 (in/in) 1 x Per Week/30 Days Discharge Instructions: Apply silicone border over primary dressing as directed. Secured With: 46M Medipore Public affairs consultant Surgical T 2x10 (in/yd) 1 x Per Week/30 Days ape Discharge Instructions: Secure with tape as directed. Compression Wrap: ThreePress (3 layer compression wrap) 1 x Per Week/30 Days Discharge Instructions: Apply three layer compression as directed. Electronic Signature(s) Signed: 10/01/2022 4:56:20 PM By: Fredirick Maudlin MD FACS Entered By: Fredirick Maudlin on 10/01/2022 14:54:32 -------------------------------------------------------------------------------- Problem List Details Patient Name: Date of Service: [name], [name] 10/01/2022 1:45 PM Medical Record Number: [id number] Patient Account Number: [id number] Date of Birth/Sex: Treating RN: 1946/05/29 (76 y.o. F) Primary Care Provider: [name] Other Clinician: Referring Provider: Treating Provider/Extender: [name] Session in Treatment: 9 Active Problems ICD-10 Encounter Code Description Active Date MDM Diagnosis [phone] Non-pressure chronic ulcer of other part of left lower leg with fat layer exposed 07/27/2022 No Yes I83.893 Varicose veins of bilateral lower extremities with other complications 1/54/0086 No Yes I87.2 Venous insufficiency (chronic) (peripheral) 07/27/2022 No Yes G62.9 Polyneuropathy, unspecified 07/27/2022 No Yes R60.0 Localized edema 07/27/2022 No Yes Inactive Problems Resolved Problems Electronic Signature(s) Signed: 10/01/2022 2:52:21 PM By: Fredirick Maudlin MD FACS Entered By: Fredirick Maudlin on 10/01/2022 14:52:21 [name] ([id number]) [id number] Page 5 of  9 -------------------------------------------------------------------------------- Progress Note Details Patient Name: Date of Service: [name], [name] 10/01/2022 1:45 PM Medical Record Number: [id number] Patient Account Number: [id number] Date of Birth/Sex: Treating RN: January 27, 1946 (76 y.o. F) Primary Care Provider: [name] Other Clinician: Referring Provider: Treating Provider/Extender: [name] Session in Treatment: 9 Subjective Chief Complaint Information obtained from Patient Patient presents for treatment of an open ulcer due to venous insufficiency History of Present Illness (HPI) ADMISSION This is a 76 year old woman with a history of chronic venous insufficiency status post saphenous vein ablations in 2010 and 2016. 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 She presents today with an open venous ulcer on her left lower leg. It has been present for a little over 2 weeks. She saw her primary care provider who apparently swabbed the wound and grew out Pseudomonas. She just completed a course of ciprofloxacin for this. ABI was 0.91. She reports that she has had  previous issues with ulcers in this same location, stemming back to a punch biopsy taken by a dermatologist many years ago. She has had several skin substitutes applied to the area that have ultimately resulted in healing on prior occasions. She has 2 small ulcers on her left medial lower leg. There is slough accumulation in both of them. The more anterior of the 2 is quite small and has some soft slough on the surface, underneath which there is good granulation tissue. The more medial wound also has slough accumulation, but the underlying surface is fairly fibrotic and gritty. This is consistent with her provided history of multiple ulcers in the same location. 08/03/2022: The anterior wound is smaller today with just a little bit of slough accumulation. The more medial  wound continues to be very sensitive and fibrotic with slough buildup. 08/13/2022: The anterior wound is closed. The more medial wound remains sensitive with a fairly fibrotic surface. There is more granulation tissue filling in, however, and there is less slough than on prior occasions. 08/20/2022: The anterior wound remains closed. The more medial wound has filled with granulation tissue but still has a fair amount of slough on the surface and remains fairly tender. Unfortunately, she has developed 2 small ulcers just proximal to this. The fat layer is exposed in each. She says that over the weekend, she felt a burning sensation in that location. 08/27/2022: The 2 small wounds proximal to the main wound have merged into a single site. The wounds look a little bit dry, but they are quite clean without significant slough accumulation. They remain quite tender. 09/03/2022: All of the wounds have now merged into 1 large wound. There is a strong odor coming from the wound and the surface is not particularly viable. It is extremely painful for her today. 09/10/2022: The wound is less black and purple this week but still does not look particularly viable. The surface is desiccated. The culture that I took last week returned with a polymicrobial population including Pseudomonas. I prescribed both Augmentin and levofloxacin. She has not yet picked up levofloxacin, as her pharmacy only notified her yesterday that it was ready. We have ordered a Keystone topical compounded antibiotic, but it has not yet come. 09/17/2022: The wound surface is markedly improved today. There is still an area of grayish-looking muscle but the rest appears significantly more viable. There is still a layer of slough on the surface. She still has a couple days left of oral antibiotic therapy. She has her [employer] compounded topical antibiotic with her today. 09/24/2022: She has completed her oral antibiotic therapy. The wound surface  is much cleaner today and more viable without any necrotic tissue. It is a bit desiccated, however. 10/01/2022: The moisture balance of the wound has improved. There is a layer of yellow slough on the surface, but beneath this, the wound is more pink. Patient History Information obtained from Patient. Family History Cancer - Mother,Paternal Grandparents, Diabetes - Siblings, Heart Disease - Father, Hypertension - Father. Social History Former smoker - quit in 1967. Medical History Eyes Patient has history of Cataracts - L eye Ear/Nose/Mouth/Throat Denies history of Chronic sinus problems/congestion, Middle ear problems Hematologic/Lymphatic Denies history of Anemia, Human Immunodeficiency Virus, Lymphedema Respiratory Patient has history of Asthma Cardiovascular Patient has history of Hypertension Endocrine Denies history of Type I Diabetes, Type II Diabetes [name], SKORUPSKI ([id number]) [id number] Page 6 of 9 Neurologic Patient has history of Neuropathy - Feet and finger Denies history of Seizure Disorder  Oncologic Patient has history of Received Chemotherapy - 2012, Received Radiation - 2012 Hospitalization/Surgery History - Lower extremity venography 2020. - Intravascular ultrasound. - peripheral vascular intervention. - melanoma 2011. Medical A Surgical History Notes nd Respiratory OSA on CPAP Cardiovascular Cronic venous insufficiency 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 Gastrointestinal liver cyst Musculoskeletal arthritis Neurologic restless leg syndrome Objective Constitutional She is hypertensive, but asymptomatic.Marland Kitchen No acute distress. Vitals Time Taken: 2:05 PM, Height: 63 in, Weight: 200 lbs, BMI: 35.4, Temperature: 98.7 F, Pulse: 82 bpm, Respiratory Rate: 16 breaths/min, Blood Pressure: 189/81 mmHg. Respiratory Normal work of breathing on room air. General Notes: 10/01/2022: The moisture balance of the  wound has improved. There is a layer of yellow slough on the surface, but beneath this, the wound is more pink. Integumentary (Hair, Skin) Wound #2 status is Open. Original cause of wound was Blister. The date acquired was: 07/06/2022. The wound has been in treatment 9 weeks. The wound is located on the Left,Posterior Lower Leg. The wound measures 6cm length x 4cm width x 0.1cm depth; 18.85cm^2 area and 1.885cm^3 volume. There is Fat Layer (Subcutaneous Tissue) exposed. There is no tunneling or undermining noted. There is a medium amount of serosanguineous drainage noted. The wound margin is flat and intact. There is medium (34-66%) red granulation within the wound bed. There is a medium (34-66%) amount of necrotic tissue within the wound bed including Eschar and Adherent Slough. The periwound skin appearance had no abnormalities noted for color. The periwound skin appearance did not exhibit: Excoriation, Maceration. The periwound has tenderness on palpation. Assessment Active Problems ICD-10 Non-pressure chronic ulcer of other part of left lower leg with fat layer exposed Varicose veins of bilateral lower extremities with other complications Venous insufficiency (chronic) (peripheral) Polyneuropathy, unspecified Localized edema Procedures Wound #2 Pre-procedure diagnosis of Wound #2 is a Venous Leg Ulcer located on the Left,Posterior Lower Leg .Severity of Tissue Pre Debridement is: Fat layer exposed. There was a Selective/Open Wound Non-Viable Tissue Debridement with a total area of 24 sq cm performed by Fredirick Maudlin, MD. With the following instrument(s): Curette to remove Non-Viable tissue/material. Material removed includes Eschar and Slough and after achieving pain control using Lidocaine 4% T opical Solution. No specimens were taken. A time out was conducted at 14:25, prior to the start of the procedure. A Minimum amount of bleeding was controlled with Pressure. The procedure was  tolerated well. Post Debridement Measurements: 6cm length x 4cm width x 0.1cm depth; 1.885cm^3 volume. Character of Wound/Ulcer Post Debridement requires further debridement. Severity of Tissue Post Debridement is: Fat layer exposed. Post procedure Diagnosis Wound #2: Same as Pre-Procedure General Notes: Scribed for Dr. Celine Ahr by Blanche East, RN. RIANA, TESSMER ([id number]) [id number] Page 7 of 9 Plan Follow-up Appointments: Return Appointment in 1 week. - Dr. Celine Ahr RM 4 Anesthetic: Wound #2 Left,Posterior Lower Leg: (In clinic) Topical Lidocaine 5% applied to wound bed - in clinic, prior to debridement Bathing/ Shower/ Hygiene: May shower with protection but do not get wound dressing(s) wet. Edema Control - Lymphedema / SCD / Other: Avoid standing for long periods of time. Patient to wear own compression stockings every day. - Right lower extremity Moisturize legs daily. WOUND #2: - Lower Leg Wound Laterality: Left, Posterior Cleanser: Soap and Water 1 x Per Week/30 Days Discharge Instructions: May shower and wash wound with dial antibacterial soap and water prior to dressing change. Peri-Wound Care: Sween Lotion (Moisturizing lotion) 1 x Per Week/30 Days Discharge Instructions: Apply moisturizing lotion  as directed Topical: Keystone 1 x Per Week/30 Days Discharge Instructions: Apply as directed to wound Prim Dressing: Promogran Prisma Matrix, 4.34 (sq in) (silver collagen) 1 x Per Week/30 Days ary Discharge Instructions: Moisten collagen with saline or hydrogel Secondary Dressing: Zetuvit Plus Silicone Border Dressing 4x4 (in/in) 1 x Per Week/30 Days Discharge Instructions: Apply silicone border over primary dressing as directed. Secured With: 47M Medipore Public affairs consultant Surgical T 2x10 (in/yd) 1 x Per Week/30 Days ape Discharge Instructions: Secure with tape as directed. Com pression Wrap: ThreePress (3 layer compression wrap) 1 x Per Week/30  Days Discharge Instructions: Apply three layer compression as directed. 10/01/2022: The moisture balance of the wound has improved. There is a layer of yellow slough on the surface, but beneath this, the wound is more pink. I used a curette to debride slough off of the wound surface. We will continue the [facility] topical antibiotic compound with Prisma silver collagen moistened with hydrogel and 3 layer compression. Follow-up in 1 week. Electronic Signature(s) Signed: 10/01/2022 2:55:06 PM By: Fredirick Maudlin MD FACS Entered By: Fredirick Maudlin on 10/01/2022 14:55:06 -------------------------------------------------------------------------------- HxROS Details Patient Name: Date of Service: [name], [name] 10/01/2022 1:45 PM Medical Record Number: [id number] Patient Account Number: [id number] Date of Birth/Sex: Treating RN: 23-Oct-1946 (76 y.o. F) Primary Care Provider: [name] Other Clinician: Referring Provider: Treating Provider/Extender: [name] Session in Treatment: 9 Information Obtained From Patient Eyes Medical History: Positive for: Cataracts - L eye Ear/Nose/Mouth/Throat Medical History: Negative for: Chronic sinus problems/congestion; Middle ear problems Hematologic/Lymphatic Medical History: Negative for: Anemia; Human Immunodeficiency Virus; Lymphedema [name], [name] ([id number]) [id number] Page 8 of 9 Respiratory Medical History: Positive for: Asthma Past Medical History Notes: OSA on CPAP Cardiovascular Medical History: Positive for: Hypertension Past Medical History Notes: Cronic venous insufficiency 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 Gastrointestinal Medical History: Past Medical History Notes: liver cyst Endocrine Medical History: Negative for: Type I Diabetes; Type II Diabetes Musculoskeletal Medical History: Past Medical History  Notes: arthritis Neurologic Medical History: Positive for: Neuropathy - Feet and finger Negative for: Seizure Disorder Past Medical History Notes: restless leg syndrome Oncologic Medical History: Positive for: Received Chemotherapy - 2012; Received Radiation - 2012 HBO Extended History Items Eyes: Cataracts Immunizations Pneumococcal Vaccine: Received Pneumococcal Vaccination: Yes Received Pneumococcal Vaccination On or After 60th Birthday: Yes Implantable Devices No devices added Hospitalization / Surgery History Type of Hospitalization/Surgery Lower extremity venography 2020 Intravascular ultrasound peripheral vascular intervention melanoma 2011 Family and Social History Cancer: Yes - Mother,Paternal Grandparents; Diabetes: Yes - Siblings; Heart Disease: Yes - Father; Hypertension: Yes - Father; Former smoker - quit in [employer]) Signed: 10/01/2022 4:56:20 PM By: Fredirick Maudlin MD FACS Entered By: Fredirick Maudlin on 10/01/2022 14:53:55 [name] ([id number]) [id number] Page 9 of 9 -------------------------------------------------------------------------------- SuperBill Details Patient Name: Date of Service: [name], [name] 10/01/2022 Medical Record Number: [id number] Patient Account Number: [id number] Date of Birth/Sex: Treating RN: [birth year] (76 y.o. F) Primary Care Provider: [name] Other Clinician: Referring Provider: Treating Provider/Extender: [name] Session in Treatment: 9 Diagnosis Coding ICD-10 Codes Code Description [id number] Non-pressure chronic ulcer of other part of left lower leg with fat layer exposed I83.893 Varicose veins of bilateral lower extremities with other complications E17.4 Venous insufficiency (chronic) (peripheral) G62.9 Polyneuropathy, unspecified R60.0 Localized edema Facility Procedures : CPT4 Code: [id number] Description: 56314 - DEBRIDE WOUND  1ST 20 SQ CM OR < ICD-10 Diagnosis Description L97.822 Non-pressure chronic ulcer of other part of left lower  leg with fat layer exposed Modifier: Quantity: 1 : CPT4 Code: [id number] Description: 56256 - DEBRIDE WOUND EA ADDL 20 SQ CM ICD-10 Diagnosis Description L97.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d Modifier: Quantity: 1 Physician Procedures : CPT4 Code Description Modifier [id number] 99214 - WC PHYS LEVEL 4 - EST PT 25 ICD-10 Diagnosis Description L97.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d I87.2 Venous insufficiency (chronic) (peripheral) R60.0  Localized edema I83.893 Varicose veins of bilateral lower extremities with other complications Quantity: 1 : [id number] 97597 - WC PHYS DEBR WO ANESTH 20 SQ CM ICD-10 Diagnosis Description L97.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d Quantity: 1 : [id number] 55974 - WC PHYS DEBR WO ANESTH EA ADD 20 CM ICD-10 Diagnosis Description L97.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d Quantity: 1 Electronic Signature(s) Signed: 10/01/2022 2:55:27 PM By: Fredirick Maudlin MD FACS Entered By: Fredirick Maudlin on 10/01/2022 14:55:27

## 2022-10-08 ENCOUNTER — Encounter (HOSPITAL_BASED_OUTPATIENT_CLINIC_OR_DEPARTMENT_OTHER): Payer: Medicare Other | Attending: General Surgery | Admitting: General Surgery

## 2022-10-08 DIAGNOSIS — L97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d: Secondary | ICD-10-CM | POA: Insufficient documentation

## 2022-10-08 DIAGNOSIS — L97818 Non-pressure chronic ulcer of other part of right lower leg with other specified severity: Secondary | ICD-10-CM | POA: Diagnosis not present

## 2022-10-08 DIAGNOSIS — L03115 Cellulitis of right lower limb: Secondary | ICD-10-CM | POA: Insufficient documentation

## 2022-10-08 DIAGNOSIS — G4733 Obstructive sleep apnea (adult) (pediatric): Secondary | ICD-10-CM | POA: Diagnosis not present

## 2022-10-08 DIAGNOSIS — G629 Polyneuropathy, unspecified: Secondary | ICD-10-CM | POA: Insufficient documentation

## 2022-10-08 DIAGNOSIS — Q969 Turner's syndrome, unspecified: Secondary | ICD-10-CM | POA: Insufficient documentation

## 2022-10-08 DIAGNOSIS — L97222 Non-pressure chronic ulcer of left calf with fat layer exposed: Secondary | ICD-10-CM | POA: Diagnosis not present

## 2022-10-08 DIAGNOSIS — I10 Essential (primary) hypertension: Secondary | ICD-10-CM | POA: Diagnosis not present

## 2022-10-08 DIAGNOSIS — I872 Venous insufficiency (chronic) (peripheral): Secondary | ICD-10-CM | POA: Diagnosis not present

## 2022-10-08 NOTE — Progress Notes (Signed)
ZIGGY, REVELES ([id number]) [id number] Page 1 of 7 Visit Report for 10/08/2022 Arrival Information Details Patient Name: Date of Service: [name], [name] 10/08/2022 1:45 PM Medical Record Number: [id number] Patient Account Number: [id number] Date of Birth/Sex: Treating RN: 01/04/1946 (76 y.o. F) Primary Care [name]: [name] Other Clinician: Referring [name]: Treating [name]/Extender: [name] Session in Treatment: 10 Visit Information History Since Last Visit All ordered tests and consults were completed: No Patient Arrived: Ambulatory Added or deleted any medications: No Arrival Time: 14:08 Any new allergies or adverse reactions: No Accompanied By: self Had a fall or experienced change in No Transfer Assistance: None activities of daily living that may affect Patient Identification Verified: Yes risk of falls: Secondary Verification Process Completed: Yes Signs or symptoms of abuse/neglect since last visito No Patient Requires Transmission-Based Precautions: No Hospitalized since last visit: No Patient Has Alerts: No Implantable device outside of the clinic excluding No cellular tissue based products placed in the center since last visit: Pain Present Now: No Electronic Signature(s) Signed: 10/08/2022 2:22:32 PM By: Worthy [name] Entered By: Worthy [name] on 10/08/2022 14:09:14 -------------------------------------------------------------------------------- Encounter Discharge Information Details Patient Name: Date of Service: [name], [name] 10/08/2022 1:45 PM Medical Record Number: [id number] Patient Account Number: [id number] Date of Birth/Sex: Treating RN: 1946-10-30 (76 y.o. [name] Primary Care [name]: [name] Other Clinician: Referring [name]: Treating [name]/Extender: [name] Session in Treatment: 10 Encounter Discharge Information Items Post  Procedure Vitals Discharge Condition: Stable Temperature (F): 99.1 Ambulatory Status: Cane Pulse (bpm): 91 Discharge Destination: Home Respiratory Rate (breaths/min): 20 Transportation: Private Auto Blood Pressure (mmHg): 167/86 Accompanied By: self Schedule Follow-up Appointment: Yes Clinical Summary of Care: Patient Declined Electronic Signature(s) Unsigned Entered By: [name] on 10/08/2022 15:06:43 Signature(s): [name] ([id number]) [id number] Date(s): [id number] Page 2 of 7 -------------------------------------------------------------------------------- Lower Extremity Assessment Details Patient Name: Date of Service: [name], [name] 10/08/2022 1:45 PM Medical Record Number: [id number] Patient Account Number: [id number] Date of Birth/Sex: Treating RN: 12-29-1945 (76 y.o. [name] Primary Care [name]: [name] Other Clinician: Referring [name]: Treating [name]/Extender: [name] Weeks in Treatment: 10 Edema Assessment Assessed: [name]: No] [name]: No] [Left: Edema] [Right: :] Calf Left: Right: Point of Measurement: From Medial Instep 41 cm Ankle Left: Right: Point of Measurement: From Medial Instep 23 cm Vascular Assessment Pulses: Dorsalis Pedis Palpable: [Left:Yes] Electronic Signature(s) Unsigned Entered By: [name] on 10/08/2022 14:23:06 -------------------------------------------------------------------------------- Multi Wound Chart Details Patient Name: Date of Service: [name], [name] 10/08/2022 1:45 PM Medical Record Number: [id number] Patient Account Number: [id number] Date of Birth/Sex: Treating RN: 16-Nov-1945 (76 y.o. F) Primary Care [name]: [name] Other Clinician: Referring [name]: Treating [name]/Extender: [name] Session in Treatment: 10 Vital Signs Height(in): 63 Pulse(bpm): 91 Weight(lbs): 200 Blood  Pressure(mmHg): 167/86 Body Mass Index(BMI): 35.4 Temperature(F): 99.1 Respiratory Rate(breaths/min): 20 [2:Photos:] [N/A:N/A] [name] ([id number]) [2:Left, Posterior Lower Leg Wound Location: Blister Wounding Event: Venous Leg Ulcer Primary Etiology: Cataracts, Asthma, Hypertension, Comorbid History: Neuropathy, Received Chemotherapy, Received Radiation 07/06/2022 Date Acquired: 10 Weeks of  Treatment: Open Wound Status: No Wound Recurrence: 6x3.8x0.1 Measurements L x W x D (cm) 17.907 A (cm) : rea 1.791 Volume (cm) : -160.60% % Reduction in A rea: -160.70% % Reduction in Volume: Full Thickness Without Exposed Classification: Support  Structures Medium Exudate A mount: Serosanguineous Exudate Type: red, [name] Exudate Color: Flat and Intact Wound Margin: Medium (34-66%) Granulation A mount: Red Granulation Quality: Medium (34-66%) Necrotic A mount:  Eschar, Adherent Slough Necrotic  Tissue: Fat Layer (Subcutaneous Tissue): Yes N/A Exposed Structures: Fascia: No Tendon: No Muscle: No Joint: No Bone: No Small (1-33%) Epithelialization: Debridement - Excisional Debridement: Pre-procedure Verification/Time Out 14:31 Taken: Lidocaine 5%  topical ointment Pain Control: Necrotic/Eschar, Subcutaneous, Tissue Debrided: Slough Skin/Subcutaneous Tissue Level: 22.8 Debridement A (sq cm): rea Curette Instrument: Minimum Bleeding: Pressure Hemostasis Achieved: Debridement Treatment Response:  Procedure was tolerated well Post Debridement Measurements L x 6x3.8x0.1 W x D (cm) 1.791 Post Debridement Volume: (cm) Excoriation: No Periwound Skin Texture: Maceration: No Periwound Skin Moisture: No Abnormalities Noted Periwound Skin Color: Yes  Tenderness on Palpation: Debridement Procedures Performed:] [N/A:N/A N/A N/A N/A N/A N/A N/A N/A N/A N/A N/A N/A N/A N/A N/A N/A N/A N/A N/A N/A N/A N/A N/A N/A N/A N/A N/A N/A N/A N/A N/A N/A N/A N/A N/A N/A N/A N/A N/A N/A] Treatment Notes Electronic Signature(s) Signed:  10/08/2022 2:42:36 PM By: Amber Maudlin MD FACS Entered By: [name] on 10/08/2022 14:42:36 -------------------------------------------------------------------------------- Multi-Disciplinary Care Plan Details Patient Name: Date of Service: [name], [name] 10/08/2022 1:45 PM Medical Record Number: [id number] Patient Account Number: [id number] Date of Birth/Sex: Treating RN: [birth year] (76 y.o. [name] Primary Care Valentin Benney: [name] Other Clinician: Referring Elonna Mcfarlane: Treating Bruce Mayers/Extender: [name] Session in Treatment: [address] NORMA, [name] ([id number]) [id number] Page 4 of 7 Wound/Skin Impairment Nursing Diagnoses: Impaired tissue integrity Goals: Ulcer/skin breakdown will have a volume reduction of 30% by week 4 Date Initiated: 08/03/2022 Date Inactivated: 09/10/2022 Target Resolution Date: 08/31/2022 Unmet Reason: uncontrolled edema, Goal Status: Unmet acute infection Ulcer/skin breakdown will have a volume reduction of 50% by week 8 Date Initiated: 09/10/2022 Target Resolution Date: 12/07/2022 Goal Status: Active Interventions: Assess ulceration(s) every visit Treatment Activities: Skin care regimen initiated : 08/03/2022 Notes: Keystone ordered. 10/30 RX Levo started. 09/10/22 Electronic Signature(s) Unsigned Entered By: [name] on 10/08/2022 14:33:27 -------------------------------------------------------------------------------- Pain Assessment Details Patient Name: Date of Service: [name], [name] 10/08/2022 1:45 PM Medical Record Number: [id number] Patient Account Number: [id number] Date of Birth/Sex: Treating RN: 12/22/1945 (76 y.o. F) Primary Care Yulieth Carrender: [name] Other Clinician: Referring Joud Pettinato: Treating Roark Rufo/Extender: [name] Session in Treatment: 10 Active Problems Location of Pain Severity and Description  of Pain Patient Has Paino Yes Site Locations Rate the pain. Current Pain Level: 4 Worst Pain Level: 10 Least Pain Level: 0 Tolerable Pain Level: 3 Character of Pain Describe the Pain: Burning Pain Management and Medication Current Pain Management: JOSCLYN, ROSALES ([id number]) [id number] Page 5 of 7 Electronic Signature(s) Signed: 10/08/2022 2:22:32 PM By: Worthy [name] Entered By: Worthy [name] on 10/08/2022 14:10:02 -------------------------------------------------------------------------------- Patient/Caregiver Education Details Patient Name: Date of Service: [name], [name] 12/4/2023andnbsp1:45 PM Medical Record Number: [id number] Patient Account Number: [id number] Date of Birth/Gender: Treating RN: 1946-06-23 (76 y.o. [name] Primary Care Physician: [name] Other Clinician: Referring Physician: Treating Physician/Extender: [name] Session in Treatment: 10 Education Assessment Education Provided To: Patient Education Topics Provided Wound/Skin Impairment: Methods: Explain/Verbal Responses: Reinforcements needed, State content correctly Electronic Signature(s) Unsigned Entered By: [name] on 10/08/2022 14:33:38 -------------------------------------------------------------------------------- Wound Assessment Details Patient Name: Date of Service: [name], [name] 10/08/2022 1:45 PM Medical Record Number: [id number] Patient Account Number: [id number] Date of Birth/Sex: Treating RN: 1946/05/15 (76 y.o. F) Primary Care Emma Birchler: [name] Other Clinician: Referring Diontay Rosencrans: Treating Albie Bazin/Extender: [name] Weeks in Treatment: 10 Wound Status Wound Number: 2 Primary Venous Leg Ulcer Etiology: Wound Location: Left,  Posterior Lower Leg Wound Open Wounding Event: Blister Status: Date Acquired: 07/06/2022 Comorbid Cataracts, Asthma, Hypertension,  Neuropathy, Received Weeks Of Treatment: 10 History: Chemotherapy, Received Radiation Clustered Wound: No Photos [name], [name] ([id number]) [id number] Page 6 of 7 Wound Measurements Length: (cm) 6 Width: (cm) 3.8 Depth: (cm) 0.1 Area: (cm) 17.907 Volume: (cm) 1.791 % Reduction in Area: -160.6% % Reduction in Volume: -160.7% Epithelialization: Small (1-33%) Tunneling: No Undermining: No Wound Description Classification: Full Thickness Without Exposed Support Structures Wound Margin: Flat and Intact Exudate Amount: Medium Exudate Type: Serosanguineous Exudate Color: red, [name] Foul Odor After Cleansing: No Slough/Fibrino Yes Wound Bed Granulation Amount: Medium (34-66%) Exposed Structure Granulation Quality: Red Fascia Exposed: No Necrotic Amount: Medium (34-66%) Fat Layer (Subcutaneous Tissue) Exposed: Yes Necrotic Quality: Eschar, Adherent Slough Tendon Exposed: No Muscle Exposed: No Joint Exposed: No Bone Exposed: No Periwound Skin Texture Texture Color No Abnormalities Noted: No No Abnormalities Noted: Yes Excoriation: No Temperature / Pain Tenderness on Palpation: Yes Moisture No Abnormalities Noted: No Maceration: No Treatment Notes Wound #2 (Lower Leg) Wound Laterality: Left, Posterior Cleanser Soap and Water Discharge Instruction: May shower and wash wound with dial antibacterial soap and water prior to dressing change. Peri-Wound Care Sween Lotion (Moisturizing lotion) Discharge Instruction: Apply moisturizing lotion as directed Topical Keystone Discharge Instruction: Apply as directed to wound Primary Dressing Promogran Prisma Matrix, 4.34 (sq in) (silver collagen) Discharge Instruction: Moisten collagen with saline or hydrogel Secondary Dressing Zetuvit Plus 4x8 in Discharge Instruction: Apply over primary dressing as directed. Secured With [employer] Surgical T 2x10 (in/yd) ape Discharge Instruction:  Secure with tape as directed. Compression [name], [name] ([id number]) [id number] Page 7 of 7 Unnaboot w/Calamine, 4x10 (in/yd) Discharge Instruction: Apply Unnaboot as directed. Compression Stockings Add-Ons Electronic Signature(s) Unsigned Previous Signature: 10/08/2022 2:22:32 PM Version By: Worthy [name] Entered By: [name] on 10/08/2022 14:24:04 -------------------------------------------------------------------------------- Vitals Details Patient Name: Date of Service: [name], [name] 10/08/2022 1:45 PM Medical Record Number: [id number] Patient Account Number: [id number] Date of Birth/Sex: Treating RN: 1946/10/08 (76 y.o. F) Primary Care Madelyn Tlatelpa: Ria [name] Other Clinician: Referring Treasure Ingrum: Treating Jalila Goodnough/Extender: [name] Session in Treatment: 10 Vital Signs Time Taken: 02:09 Temperature (F): 99.1 Height (in): 63 Pulse (bpm): 91 Weight (lbs): 200 Respiratory Rate (breaths/min): 20 Body Mass Index (BMI): 35.4 Blood Pressure (mmHg): 167/86 Reference Range: 80 - 120 mg / dl Electronic Signature(s) Signed: 10/08/2022 2:22:32 PM By: Worthy [name] Entered By: Worthy [name] on 10/08/2022 14:09:44

## 2022-10-08 NOTE — Progress Notes (Signed)
[name] ([id number]) [id number] Page 1 of 10 Visit Report for 10/08/2022 Chief Complaint Document Details Patient Name: Date of Service: [name], [name] 10/08/2022 1:45 PM Medical Record Number: [id number] Patient Account Number: [id number] Date of Birth/Sex: Treating RN: January 17, 1946 (76 y.o. F) Primary Care Provider: [name] Other Clinician: Referring Provider: Treating Provider/Extender: [name] Session in Treatment: 10 Information Obtained from: Patient Chief Complaint Patient presents for treatment of an open ulcer due to venous insufficiency Electronic Signature(s) Signed: 10/08/2022 2:42:42 PM By: Fredirick Maudlin MD FACS Entered By: Fredirick Maudlin on 10/08/2022 14:42:42 -------------------------------------------------------------------------------- Debridement Details Patient Name: Date of Service: [name], [name] 10/08/2022 1:45 PM Medical Record Number: [id number] Patient Account Number: [id number] Date of Birth/Sex: Treating RN: Mar 19, 1946 (76 y.o. [name], [name] Primary Care Provider: [name] Other Clinician: Referring Provider: Treating Provider/Extender: [name] Session in Treatment: 10 Debridement Performed for Assessment: Wound #2 Left,Posterior Lower Leg Performed By: Physician Fredirick Maudlin, MD Debridement Type: Debridement Severity of Tissue Pre Debridement: Fat layer exposed Level of Consciousness (Pre-procedure): Awake and Alert Pre-procedure Verification/Time Out Yes - 14:31 Taken: Start Time: 14:31 Pain Control: Lidocaine 5% topical ointment T Area Debrided (L x W): otal 6 (cm) x 3.8 (cm) = 22.8 (cm) Tissue and other material debrided: Non-Viable, Eschar, Slough, Subcutaneous, Slough Level: Skin/Subcutaneous Tissue Debridement Description: Excisional Instrument: Curette Bleeding: Minimum Hemostasis Achieved: Pressure Response to  Treatment: Procedure was tolerated well Level of Consciousness (Post- Awake and Alert procedure): Post Debridement Measurements of Total Wound Length: (cm) 6 Width: (cm) 3.8 Depth: (cm) 0.1 Volume: (cm) 1.791 Character of Wound/Ulcer Post Debridement: Improved Severity of Tissue Post Debridement: Fat layer exposed Post Procedure Diagnosis Same as Pre-procedure [name], [name] ([id number]) [id number] Page 2 of 10 Notes scribed for Dr. Celine Ahr by Adline Peals, RN Electronic Signature(s) Signed: 10/08/2022 4:47:46 PM By: Fredirick Maudlin MD FACS Signed: 10/08/2022 4:56:57 PM By: Sabas Sous By: Adline Peals on 10/08/2022 14:32:58 -------------------------------------------------------------------------------- HPI Details Patient Name: Date of Service: [name] 10/08/2022 1:45 PM Medical Record Number: [id number] Patient Account Number: [id number] Date of Birth/Sex: Treating RN: 10-[name]-1946 (76 y.o. F) Primary Care Provider: [name] Other Clinician: Referring Provider: Treating Provider/Extender: [name] Session in Treatment: 10 History of Present Illness HPI Description: ADMISSION This is a 76 year old woman with a history of chronic venous insufficiency status post saphenous vein ablations in 2010 and 2016. 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 She presents today with an open venous ulcer on her left lower leg. It has been present for a little over 2 weeks. She saw her primary care provider who apparently swabbed the wound and grew out Pseudomonas. She just completed a course of ciprofloxacin for this. ABI was 0.91. She reports that she has had previous issues with ulcers in this same location, stemming back to a punch biopsy taken by a dermatologist many years ago. She has had several skin substitutes applied to the area that have ultimately resulted in healing  on prior occasions. She has 2 small ulcers on her left medial lower leg. There is slough accumulation in both of them. The more anterior of the 2 is quite small and has some soft slough on the surface, underneath which there is good granulation tissue. The more medial wound also has slough accumulation, but the underlying surface is fairly fibrotic and gritty. This is consistent with her provided history of multiple ulcers in the same location. 08/03/2022: The anterior wound  is smaller today with just a little bit of slough accumulation. The more medial wound continues to be very sensitive and fibrotic with slough buildup. 08/13/2022: The anterior wound is closed. The more medial wound remains sensitive with a fairly fibrotic surface. There is more granulation tissue filling in, however, and there is less slough than on prior occasions. 08/20/2022: The anterior wound remains closed. The more medial wound has filled with granulation tissue but still has a fair amount of slough on the surface and remains fairly tender. Unfortunately, she has developed 2 small ulcers just proximal to this. The fat layer is exposed in each. She says that over the weekend, she felt a burning sensation in that location. 08/27/2022: The 2 small wounds proximal to the main wound have merged into a single site. The wounds look a little bit dry, but they are quite clean without significant slough accumulation. They remain quite tender. 09/03/2022: All of the wounds have now merged into 1 large wound. There is a strong odor coming from the wound and the surface is not particularly viable. It is extremely painful for her today. 09/10/2022: The wound is less black and purple this week but still does not look particularly viable. The surface is desiccated. The culture that I took last week returned with a polymicrobial population including Pseudomonas. I prescribed both Augmentin and levofloxacin. She has not yet picked up  levofloxacin, as her pharmacy only notified her yesterday that it was ready. We have ordered a Keystone topical compounded antibiotic, but it has not yet come. 09/17/2022: The wound surface is markedly improved today. There is still an area of grayish-looking muscle but the rest appears significantly more viable. There is still a layer of slough on the surface. She still has a couple days left of oral antibiotic therapy. She has her [employer] compounded topical antibiotic with her today. 09/24/2022: She has completed her oral antibiotic therapy. The wound surface is much cleaner today and more viable without any necrotic tissue. It is a bit desiccated, however. 10/01/2022: The moisture balance of the wound has improved. There is a layer of yellow slough on the surface, but beneath this, the wound is more pink. 10/08/2022: The wound is smaller and cleaner today with a layer of slough present. She is having less pain. Electronic Signature(s) Signed: 10/08/2022 2:43:18 PM By: Fredirick Maudlin MD FACS Entered By: Fredirick Maudlin on 10/08/2022 14:43:17 Binion, [name] ([id number]) [id number] Page 3 of 10 -------------------------------------------------------------------------------- Physical Exam Details Patient Name: Date of Service: ANUSHREE, DORSI 10/08/2022 1:45 PM Medical Record Number: [id number] Patient Account Number: [id number] Date of Birth/Sex: Treating RN: [birth year] (76 y.o. F) Primary Care Provider: [name] Other Clinician: Referring Provider: Treating Provider/Extender: [name] Session in Treatment: 10 Constitutional She is hypertensive, but asymptomatic.. . . . No acute distress. Respiratory Normal work of breathing on room air. Notes 10/08/2022: The wound is smaller and cleaner today with a layer of slough present. She is having less pain. Electronic Signature(s) Signed: 10/08/2022 2:43:53 PM By: Fredirick Maudlin MD  FACS Entered By: Fredirick Maudlin on 10/08/2022 14:43:52 -------------------------------------------------------------------------------- Physician Orders Details Patient Name: Date of Service: [name], [name] 10/08/2022 1:45 PM Medical Record Number: [id number] Patient Account Number: [id number] Date of Birth/Sex: Treating RN: December 16, 1945 (76 y.o. [name] Primary Care Provider: [name] Other Clinician: Referring Provider: Treating Provider/Extender: [name] Session in Treatment: 10 Verbal / Phone Orders: No Diagnosis Coding ICD-10 Coding Code Description [phone] Non-pressure chronic ulcer of  other part of left lower leg with fat layer exposed I83.893 Varicose veins of bilateral lower extremities with other complications Z61.0 Venous insufficiency (chronic) (peripheral) G62.9 Polyneuropathy, unspecified R60.0 Localized edema Follow-up Appointments ppointment in 1 week. - Dr. Celine Ahr RM 4 Return A Anesthetic Wound #2 Left,Posterior Lower Leg (In clinic) Topical Lidocaine 5% applied to wound bed - in clinic, prior to debridement Bathing/ Shower/ Hygiene May shower with protection but do not get wound dressing(s) wet. Edema Control - Lymphedema / SCD / Other Left Lower Extremity Avoid standing for long periods of time. Patient to wear own compression stockings every day. - Right lower extremity Moisturize legs daily. Wound Treatment PATSIE, MCCARDLE ([id number]) [id number] Page 4 of 10 Wound #2 - Lower Leg Wound Laterality: Left, Posterior Cleanser: Soap and Water 1 x Per Week/30 Days Discharge Instructions: May shower and wash wound with dial antibacterial soap and water prior to dressing change. Peri-Wound Care: Sween Lotion (Moisturizing lotion) 1 x Per Week/30 Days Discharge Instructions: Apply moisturizing lotion as directed Topical: Keystone 1 x Per Week/30 Days Discharge Instructions: Apply as  directed to wound Prim Dressing: Promogran Prisma Matrix, 4.34 (sq in) (silver collagen) 1 x Per Week/30 Days ary Discharge Instructions: Moisten collagen with saline or hydrogel Secondary Dressing: Zetuvit Plus 4x8 in 1 x Per Week/30 Days Discharge Instructions: Apply over primary dressing as directed. Secured With: 62M Medipore Public affairs consultant Surgical T 2x10 (in/yd) 1 x Per Week/30 Days ape Discharge Instructions: Secure with tape as directed. Compression Wrap: Unnaboot w/Calamine, 4x10 (in/yd) 1 x Per Week/30 Days Discharge Instructions: Apply Unnaboot as directed. Patient Medications llergies: sulfa antibiotics, Voltaren, doxycycline, Neoprene, adhesive, latex A Notifications Medication Indication Start End 10/08/2022 lidocaine DOSE topical 5 % ointment - ointment topical Electronic Signature(s) Signed: 10/08/2022 4:47:46 PM By: Fredirick Maudlin MD FACS Entered By: Fredirick Maudlin on 10/08/2022 14:44:14 -------------------------------------------------------------------------------- Problem List Details Patient Name: Date of Service: [name], [name] 10/08/2022 1:45 PM Medical Record Number: [id number] Patient Account Number: [id number] Date of Birth/Sex: Treating RN: 1946/05/18 (76 y.o. F) Primary Care Provider: [name] Other Clinician: Referring Provider: Treating Provider/Extender: [name] Session in Treatment: 10 Active Problems ICD-10 Encounter Code Description Active Date MDM Diagnosis [phone] Non-pressure chronic ulcer of other part of left lower leg with fat layer exposed 07/27/2022 No Yes I83.893 Varicose veins of bilateral lower extremities with other complications 8/65/7846 No Yes I87.2 Venous insufficiency (chronic) (peripheral) 07/27/2022 No Yes G62.9 Polyneuropathy, unspecified 07/27/2022 No Yes [name], [name] ([id number]) [id number] Page 5 of 10 R60.0 Localized edema 07/27/2022 No Yes Inactive  Problems Resolved Problems Electronic Signature(s) Signed: 10/08/2022 2:42:31 PM By: Fredirick Maudlin MD FACS Entered By: Fredirick Maudlin on 10/08/2022 14:42:31 -------------------------------------------------------------------------------- Progress Note Details Patient Name: Date of Service: [name], [name] 10/08/2022 1:45 PM Medical Record Number: [id number] Patient Account Number: [id number] Date of Birth/Sex: Treating RN: 1946-01-11 (76 y.o. F) Primary Care Provider: [name] Other Clinician: Referring Provider: Treating Provider/Extender: [name] Session in Treatment: 10 Subjective Chief Complaint Information obtained from Patient Patient presents for treatment of an open ulcer due to venous insufficiency History of Present Illness (HPI) ADMISSION This is a 76 year old woman with a history of chronic venous insufficiency status post saphenous vein ablations in 2010 and 2016. 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 She presents today with an open venous ulcer on her left lower leg. It has been present for a little over 2 weeks. She saw her primary care provider who  apparently swabbed the wound and grew out Pseudomonas. She just completed a course of ciprofloxacin for this. ABI was 0.91. She reports that she has had previous issues with ulcers in this same location, stemming back to a punch biopsy taken by a dermatologist many years ago. She has had several skin substitutes applied to the area that have ultimately resulted in healing on prior occasions. She has 2 small ulcers on her left medial lower leg. There is slough accumulation in both of them. The more anterior of the 2 is quite small and has some soft slough on the surface, underneath which there is good granulation tissue. The more medial wound also has slough accumulation, but the underlying surface is fairly fibrotic and gritty. This is consistent with her  provided history of multiple ulcers in the same location. 08/03/2022: The anterior wound is smaller today with just a little bit of slough accumulation. The more medial wound continues to be very sensitive and fibrotic with slough buildup. 08/13/2022: The anterior wound is closed. The more medial wound remains sensitive with a fairly fibrotic surface. There is more granulation tissue filling in, however, and there is less slough than on prior occasions. 08/20/2022: The anterior wound remains closed. The more medial wound has filled with granulation tissue but still has a fair amount of slough on the surface and remains fairly tender. Unfortunately, she has developed 2 small ulcers just proximal to this. The fat layer is exposed in each. She says that over the weekend, she felt a burning sensation in that location. 08/27/2022: The 2 small wounds proximal to the main wound have merged into a single site. The wounds look a little bit dry, but they are quite clean without significant slough accumulation. They remain quite tender. 09/03/2022: All of the wounds have now merged into 1 large wound. There is a strong odor coming from the wound and the surface is not particularly viable. It is extremely painful for her today. 09/10/2022: The wound is less black and purple this week but still does not look particularly viable. The surface is desiccated. The culture that I took last week returned with a polymicrobial population including Pseudomonas. I prescribed both Augmentin and levofloxacin. She has not yet picked up levofloxacin, as her pharmacy only notified her yesterday that it was ready. We have ordered a Keystone topical compounded antibiotic, but it has not yet come. 09/17/2022: The wound surface is markedly improved today. There is still an area of grayish-looking muscle but the rest appears significantly more viable. There is still a layer of slough on the surface. She still has a couple days left of  oral antibiotic therapy. She has her [employer] compounded topical antibiotic with her today. 09/24/2022: She has completed her oral antibiotic therapy. The wound surface is much cleaner today and more viable without any necrotic tissue. It is a bit desiccated, however. 10/01/2022: The moisture balance of the wound has improved. There is a layer of yellow slough on the surface, but beneath this, the wound is more pink. 10/08/2022: The wound is smaller and cleaner today with a layer of slough present. She is having less pain. Patient History Information obtained from Patient. AYLINE, DINGUS ([id number]) [id number] Page 6 of 10 Family History Cancer - Mother,Paternal Grandparents, Diabetes - Siblings, Heart Disease - Father, Hypertension - Father. Social History Former smoker - quit in 1967. Medical History Eyes Patient has history of Cataracts - L eye Ear/Nose/Mouth/Throat Denies history of Chronic sinus problems/congestion, Middle ear problems Hematologic/Lymphatic  Denies history of Anemia, Human Immunodeficiency Virus, Lymphedema Respiratory Patient has history of Asthma Cardiovascular Patient has history of Hypertension Endocrine Denies history of Type I Diabetes, Type II Diabetes Neurologic Patient has history of Neuropathy - Feet and finger Denies history of Seizure Disorder Oncologic Patient has history of Received Chemotherapy - 2012, Received Radiation - 2012 Hospitalization/Surgery History - Lower extremity venography 2020. - Intravascular ultrasound. - peripheral vascular intervention. - melanoma 2011. Medical A Surgical History Notes nd Respiratory OSA on CPAP Cardiovascular Cronic venous insufficiency 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 Gastrointestinal liver cyst Musculoskeletal arthritis Neurologic restless leg syndrome Objective Constitutional She is hypertensive, but asymptomatic.Marland Kitchen No acute  distress. Vitals Time Taken: 2:09 AM, Height: 63 in, Weight: 200 lbs, BMI: 35.4, Temperature: 99.1 F, Pulse: 91 bpm, Respiratory Rate: 20 breaths/min, Blood Pressure: 167/86 mmHg. Respiratory Normal work of breathing on room air. General Notes: 10/08/2022: The wound is smaller and cleaner today with a layer of slough present. She is having less pain. Integumentary (Hair, Skin) Wound #2 status is Open. Original cause of wound was Blister. The date acquired was: 07/06/2022. The wound has been in treatment 10 weeks. The wound is located on the Left,Posterior Lower Leg. The wound measures 6cm length x 3.8cm width x 0.1cm depth; 17.907cm^2 area and 1.791cm^3 volume. There is Fat Layer (Subcutaneous Tissue) exposed. There is no tunneling or undermining noted. There is a medium amount of serosanguineous drainage noted. The wound margin is flat and intact. There is medium (34-66%) red granulation within the wound bed. There is a medium (34-66%) amount of necrotic tissue within the wound bed including Eschar and Adherent Slough. The periwound skin appearance had no abnormalities noted for color. The periwound skin appearance did not exhibit: Excoriation, Maceration. The periwound has tenderness on palpation. 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 There was a Excisional Skin/Subcutaneous Tissue Debridement with a total area of 22.8 sq cm performed by Fredirick Maudlin, MD. With the following instrument(s): Curette to  remove Non-Viable tissue/material. Material removed includes Eschar, Subcutaneous Tissue, and Slough after achieving pain control using Lidocaine 5% topical ointment. No specimens were taken. A time out was conducted at 14:31, prior to the start of the procedure. A Minimum amount of bleeding was controlled with Pressure. The procedure was tolerated well. Post Debridement Measurements: 6cm length x 3.8cm width x 0.1cm depth; 1.791cm^3 volume. Character of Wound/Ulcer Post Debridement is improved. Severity of Tissue Post Debridement is: Fat layer exposed. Post procedure Diagnosis Wound #2: Same as Pre-Procedure General Notes: scribed for Dr. Celine Ahr by Adline Peals, RN. Pre-procedure diagnosis of Wound #2 is a Venous Leg Ulcer located on the Left,Posterior Lower Leg . There was a Haematologist Compression Therapy Procedure by Adline Peals, RN. Post procedure Diagnosis Wound #2: Same as Pre-Procedure Plan Follow-up Appointments: Return Appointment in 1 week. - Dr. Celine Ahr RM 4 Anesthetic: Wound #2 Left,Posterior Lower Leg: (In clinic) Topical Lidocaine 5% applied to wound bed - in clinic, prior to debridement Bathing/ Shower/ Hygiene: May shower with protection but do not get wound dressing(s) wet. Edema Control - Lymphedema / SCD / Other: Avoid  standing for long periods of time. Patient to wear own compression stockings every day. - Right lower extremity Moisturize legs daily. The following medication(s) was prescribed: lidocaine topical 5 % ointment ointment topical was prescribed at facility WOUND #2: - Lower Leg Wound Laterality: Left, Posterior Cleanser: Soap and Water 1 x Per Week/30 Days Discharge Instructions: May shower and wash wound with dial antibacterial soap and water prior to dressing change. Peri-Wound Care: Sween Lotion (Moisturizing lotion) 1 x Per Week/30 Days Discharge Instructions: Apply moisturizing lotion as directed Topical: Keystone 1 x Per Week/30  Days Discharge Instructions: Apply as directed to wound Prim Dressing: Promogran Prisma Matrix, 4.34 (sq in) (silver collagen) 1 x Per Week/30 Days ary Discharge Instructions: Moisten collagen with saline or hydrogel Secondary Dressing: Zetuvit Plus 4x8 in 1 x Per Week/30 Days Discharge Instructions: Apply over primary dressing as directed. Secured With: 65M Medipore Public affairs consultant Surgical T 2x10 (in/yd) 1 x Per Week/30 Days ape Discharge Instructions: Secure with tape as directed. Com pression Wrap: Unnaboot w/Calamine, 4x10 (in/yd) 1 x Per Week/30 Days Discharge Instructions: Apply Unnaboot as directed. 10/08/2022: The wound is smaller and cleaner today with a layer of slough present. She is having less pain. I used a curette to debride slough, eschar, and nonviable subcutaneous tissue from the wound. We will continue to use her Keystone topical antibiotic compound with Prisma silver collagen and an Haematologist. Follow-up in 1 week. Electronic Signature(s) Signed: 10/08/2022 4:47:46 PM By: Fredirick Maudlin MD FACS Signed: 10/08/2022 4:56:57 PM By: Adline Peals Previous Signature: 10/08/2022 2:44:55 PM Version By: Fredirick Maudlin MD FACS Entered By: Adline Peals on 10/08/2022 16:40:40 -------------------------------------------------------------------------------- HxROS Details Patient Name: Date of Service: [name], [name] 10/08/2022 1:45 PM Medical Record Number: [id number] Patient Account Number: [id number] Date of Birth/Sex: Treating RN: 1945/12/01 (76 y.o. F) Primary Care Provider: [name] Other Clinician: JOSCELYN, [name] ([id number]) [id number] Page 8 of 10 Referring Provider: Treating Provider/Extender: [name] Session in Treatment: 10 Information Obtained From Patient Eyes Medical History: Positive for: Cataracts - L eye Ear/Nose/Mouth/Throat Medical History: Negative for: Chronic sinus  problems/congestion; Middle ear problems Hematologic/Lymphatic Medical History: Negative for: Anemia; Human Immunodeficiency Virus; Lymphedema Respiratory Medical History: Positive for: Asthma Past Medical History Notes: OSA on CPAP Cardiovascular Medical History: Positive for: Hypertension Past Medical History Notes: Cronic venous insufficiency 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 Gastrointestinal Medical History: Past Medical History Notes: liver cyst Endocrine Medical History: Negative for: Type I Diabetes; Type II Diabetes Musculoskeletal Medical History: Past Medical History Notes: arthritis Neurologic Medical History: Positive for: Neuropathy - Feet and finger Negative for: Seizure Disorder Past Medical History Notes: restless leg syndrome Oncologic Medical History: Positive for: Received Chemotherapy - 2012; Received Radiation - 2012 HBO Extended History Items Eyes: Cataracts Immunizations Pneumococcal Vaccine: Received Pneumococcal Vaccination: Yes Received Pneumococcal Vaccination On or After 60th Birthday: Yes Implantable Devices [name], [name] ([id number]) [id number] Page 9 of 10 No devices added Hospitalization / Surgery History Type of Hospitalization/Surgery Lower extremity venography 2020 Intravascular ultrasound peripheral vascular intervention melanoma 2011 Family and Social History Cancer: Yes - Mother,Paternal Grandparents; Diabetes: Yes - Siblings; Heart Disease: Yes - Father; Hypertension: Yes - Father; Former smoker - quit in [employer]) Signed: 10/08/2022 4:47:46 PM By: Fredirick Maudlin MD FACS Entered By: Fredirick Maudlin on 10/08/2022 14:43:23 -------------------------------------------------------------------------------- SuperBill Details Patient Name: Date of Service: [name], [name] 10/08/2022 Medical Record Number: [id number] Patient Account Number:  [id number] Date of Birth/Sex: Treating RN: [birth year] (76 y.o.  F) Primary Care Provider: [name] Other Clinician: Referring Provider: Treating Provider/Extender: [name] Session in Treatment: 10 Diagnosis Coding ICD-10 Codes Code Description [phone] Non-pressure chronic ulcer of other part of left lower leg with fat layer exposed I83.893 Varicose veins of bilateral lower extremities with other complications Y64.1 Venous insufficiency (chronic) (peripheral) G62.9 Polyneuropathy, unspecified R60.0 Localized edema Facility Procedures : CPT4 Code: [id number] Description: [place] SUBQ TISSUE 20 SQ CM/< ICD-10 Diagnosis Description L97.822 Non-pressure chronic ulcer of other part of left lower leg with fat layer expo Modifier: sed Quantity: 1 : CPT4 Code: [id number] Description: 31594 - DEB SUBQ TISS EA ADDL 20CM ICD-10 Diagnosis Description L97.822 Non-pressure chronic ulcer of other part of left lower leg with fat layer expo Modifier: sed Quantity: 1 Physician Procedures : CPT4 Code Description Modifier [id number] 99214 - WC PHYS LEVEL 4 - EST PT 25 ICD-10 Diagnosis Description L97.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d I83.893 Varicose veins of bilateral lower extremities with  other complications K46.2 Venous insufficiency (chronic) (peripheral) R60.0 Localized edema Quantity: 1 : [id number] 11042 - WC PHYS SUBQ TISS 20 SQ CM ICD-10 Diagnosis Description L97.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d Quantity: 1 : [id number] 38333 - WC PHYS SUBQ TISS EA ADDL 20 CM ICD-10 Diagnosis Description CECILEY, BUIST ([id number]) [id number] ICD-10 Diagnosis Description L97.822 Non-pressure chronic ulcer of other part of left lower leg with fat layer  exposed Quantity: 1 .pdf Page 10 of 10 Electronic Signature(s) Signed: 10/08/2022 2:45:13 PM By: Fredirick Maudlin MD FACS Entered By: Fredirick Maudlin on 10/08/2022 14:45:13

## 2022-10-15 ENCOUNTER — Encounter (HOSPITAL_BASED_OUTPATIENT_CLINIC_OR_DEPARTMENT_OTHER): Payer: Medicare Other | Admitting: Internal Medicine

## 2022-10-17 ENCOUNTER — Encounter (HOSPITAL_BASED_OUTPATIENT_CLINIC_OR_DEPARTMENT_OTHER): Payer: Medicare Other | Admitting: Internal Medicine

## 2022-10-17 DIAGNOSIS — L97818 Non-pressure chronic ulcer of other part of right lower leg with other specified severity: Secondary | ICD-10-CM | POA: Diagnosis not present

## 2022-10-17 DIAGNOSIS — L03115 Cellulitis of right lower limb: Secondary | ICD-10-CM | POA: Diagnosis not present

## 2022-10-17 DIAGNOSIS — G629 Polyneuropathy, unspecified: Secondary | ICD-10-CM | POA: Diagnosis not present

## 2022-10-17 DIAGNOSIS — G4733 Obstructive sleep apnea (adult) (pediatric): Secondary | ICD-10-CM | POA: Diagnosis not present

## 2022-10-17 DIAGNOSIS — L97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d: Secondary | ICD-10-CM | POA: Diagnosis not present

## 2022-10-17 DIAGNOSIS — Q969 Turner's syndrome, unspecified: Secondary | ICD-10-CM | POA: Diagnosis not present

## 2022-10-18 NOTE — Progress Notes (Signed)
LYRA, ALAIMO ([id number]) [id number] Page 1 of 4 Visit Report for 10/17/2022 Arrival Information Details Patient Name: Date of Service: [name], [name] 10/17/2022 2:00 PM Medical Record Number: [id number] Patient Account Number: [id number] Date of Birth/Sex: Treating RN: [birth year] (76 y.o. F) Primary Care Arnav Cregg: [name] Other Clinician: Referring Demaya Hardge: Treating Dalphine Cowie/Extender: Pati Gallo in Treatment: 11 Visit Information History Since Last Visit All ordered tests and consults were completed: No Patient Arrived: Ambulatory Added or deleted any medications: No Arrival Time: 14:00 Any new allergies or adverse reactions: No Accompanied By: self Had a fall or experienced change in No Transfer Assistance: None activities of daily living that may affect Patient Identification Verified: Yes risk of falls: Secondary Verification Process Completed: Yes Signs or symptoms of abuse/neglect since last visito No Patient Requires Transmission-Based Precautions: No Hospitalized since last visit: No Patient Has Alerts: No Implantable device outside of the clinic excluding No cellular tissue based products placed in the center since last visit: Pain Present Now: No Electronic Signature(s) Signed: 10/17/2022 3:40:29 PM By: Worthy Rancher Entered By: Worthy Rancher on 10/17/2022 14:00:39 -------------------------------------------------------------------------------- Compression Therapy Details Patient Name: Date of Service: [name], [name] 10/17/2022 2:00 PM Medical Record Number: [id number] Patient Account Number: [id number] Date of Birth/Sex: Treating RN: 09-02-1946 (76 y.o. F) Primary Care Deania Siguenza: [name] Other Clinician: Referring Johnell Bas: Treating Murel Shenberger/Extender: Pati Gallo in Treatment: 11 Compression Therapy Performed for Wound Assessment: Wound #2 Left,Posterior  Lower Leg Performed By: Clinician Erenest Blank, Compression Type: Rolena Infante Electronic Signature(s) Signed: 10/17/2022 4:26:33 PM By: Erenest Blank Entered By: Erenest Blank on 10/17/2022 16:22:48 -------------------------------------------------------------------------------- Encounter Discharge Information Details Patient Name: Date of Service: [name], [name] 10/17/2022 2:00 PM Medical Record Number: [id number] Patient Account Number: [id number] Date of Birth/Sex: Treating RN: 1945-12-12 (76 y.o. Loyce Flaming, [name] ([id number]) [id number] Page 2 of 4 Primary Care Rayn Shorb: [name] Other Clinician: Erenest Blank Referring Shaleta Ruacho: Treating Lira Stephen/Extender: Pati Gallo in Treatment: 11 Encounter Discharge Information Items Discharge Condition: Stable Ambulatory Status: Ambulatory Discharge Destination: Home Transportation: Private Auto Accompanied By: self Schedule Follow-up Appointment: Yes Clinical Summary of Care: Electronic Signature(s) Signed: 10/17/2022 4:26:33 PM By: Erenest Blank Entered By: Erenest Blank on 10/17/2022 16:23:25 -------------------------------------------------------------------------------- Patient/Caregiver Education Details Patient Name: Date of Service: [name], [name] 12/13/2023andnbsp2:00 PM Medical Record Number: [id number] Patient Account Number: [id number] Date of Birth/Gender: Treating RN: 11-30-1945 (76 y.o. F) Primary Care Physician: [name] Other Clinician: Erenest Blank Referring Physician: Treating Physician/Extender: Pati Gallo in Treatment: 11 Education Assessment Education Provided To: Patient Education Topics Provided Electronic Signature(s) Signed: 10/17/2022 4:26:33 PM By: Erenest Blank Entered By: Erenest Blank on 10/17/2022  16:23:08 -------------------------------------------------------------------------------- Wound Assessment Details Patient Name: Date of Service: [name], [name] 10/17/2022 2:00 PM Medical Record Number: [id number] Patient Account Number: [id number] Date of Birth/Sex: Treating RN: 1946/05/01 (76 y.o. [name] Primary Care Kin Galbraith: [name] Other Clinician: Referring Chloe Baig: Treating Josian Lanese/Extender: Pati Gallo in Treatment: 11 Wound Status Wound Number: 2 Primary Venous Leg Ulcer Etiology: Wound Location: Left, Posterior Lower Leg Wound Open Wounding Event: Blister Status: Date Acquired: 07/06/2022 Comorbid Cataracts, Asthma, Hypertension, Neuropathy, Received Weeks Of Treatment: 11 History: Chemotherapy, Received Radiation Clustered Wound: No Wound Measurements [name], [name] ([id number]) Length: (cm) 6 Width: (cm) 3.8 Depth: (cm) 0.1 Area: (cm) 17.907 Volume: (cm) 1.791 [id number] Page 3 of 4 % Reduction in Area: -160.6% % Reduction in Volume: -160.7% Epithelialization: Small (1-33%) Tunneling: No Undermining:  No Wound Description Classification: Full Thickness Without Exposed Suppor Wound Margin: Flat and Intact Exudate Amount: Medium Exudate Type: Serosanguineous Exudate Color: red, [name] t Structures Foul Odor After Cleansing: No Slough/Fibrino Yes Wound Bed Granulation Amount: Medium (34-66%) Exposed Structure Granulation Quality: Red Fascia Exposed: No Necrotic Amount: Medium (34-66%) Fat Layer (Subcutaneous Tissue) Exposed: Yes Necrotic Quality: Eschar, Adherent Slough Tendon Exposed: No Muscle Exposed: No Joint Exposed: No Bone Exposed: No Periwound Skin Texture Texture Color No Abnormalities Noted: No No Abnormalities Noted: Yes Excoriation: No Temperature / Pain Tenderness on Palpation: Yes Moisture No Abnormalities Noted: No Maceration: No Treatment Notes Wound  #2 (Lower Leg) Wound Laterality: Left, Posterior Cleanser Soap and Water Discharge Instruction: May shower and wash wound with dial antibacterial soap and water prior to dressing change. Peri-Wound Care Sween Lotion (Moisturizing lotion) Discharge Instruction: Apply moisturizing lotion as directed Topical [place]stone Discharge Instruction: Apply as directed to wound Primary Dressing Promogran Prisma Matrix, 4.34 (sq in) (silver collagen) Discharge Instruction: Moisten collagen with saline or hydrogel Secondary Dressing Zetuvit Plus 4x8 in Discharge Instruction: Apply over primary dressing as directed. Secured With [employer] Surgical T 2x10 (in/yd) ape Discharge Instruction: Secure with tape as directed. Compression Wrap Unnaboot w/Calamine, 4x10 (in/yd) Discharge Instruction: Apply Unnaboot as directed. Compression Stockings Add-Ons Electronic Signature(s) Signed: 10/17/2022 5:14:19 PM By: Dellie Catholic RN Entered By: Dellie Catholic on 10/17/2022 14:59:45 [name] ([id number]) [id number] Page 4 of 4 -------------------------------------------------------------------------------- Vitals Details Patient Name: Date of Service: [name], [name] 10/17/2022 2:00 PM Medical Record Number: [id number] Patient Account Number: [id number] Date of Birth/Sex: Treating RN: [birth year] (76 y.o. F) Primary Care Layali Freund: [name] Other Clinician: Referring Abir Craine: Treating Shone Leventhal/Extender: Pati Gallo in Treatment: 11 Vital Signs Time Taken: 02:00 Temperature (F): 98.3 Height (in): 63 Pulse (bpm): 71 Weight (lbs): 200 Respiratory Rate (breaths/min): 20 Body Mass Index (BMI): 35.4 Blood Pressure (mmHg): 181/80 Reference Range: 80 - 120 mg / dl Electronic Signature(s) Signed: 10/17/2022 3:40:29 PM By: Worthy Rancher Entered By: Worthy Rancher on 10/17/2022 14:01:03

## 2022-10-18 NOTE — Progress Notes (Signed)
SHEVAUN, LOVAN ([id number]) [id number] Page 1 of 1 Visit Report for 10/17/2022 SuperBill Details Patient Name: Date of Service: [name], [name] 10/17/2022 Medical Record Number: [id number] Patient Account Number: [id number] Date of Birth/Sex: Treating RN: 13-Feb-1946 (76 y.o. F) Primary Care Provider: [name] Other Clinician: Referring Provider: Treating Provider/Extender: Pati Gallo in Treatment: 11 Diagnosis Coding ICD-10 Codes Code Description [phone] Non-pressure chronic ulcer of other part of left lower leg with fat layer exposed I83.893 Varicose veins of bilateral lower extremities with other complications H47.6 Venous insufficiency (chronic) (peripheral) G62.9 Polyneuropathy, unspecified R60.0 Localized edema Facility Procedures CPT4 Code Description Modifier Quantity [id number] (Facility Use Only) [id number] - [place] LT LEG 1 Electronic Signature(s) Signed: 10/17/2022 4:26:33 PM By: Erenest Blank Signed: 10/17/2022 5:47:10 PM By: Linton Ham MD Entered By: Erenest Blank on 10/17/2022 16:23:41

## 2022-10-19 ENCOUNTER — Ambulatory Visit (INDEPENDENT_AMBULATORY_CARE_PROVIDER_SITE_OTHER): Payer: Medicare Other

## 2022-10-19 DIAGNOSIS — Z0000 Encounter for general adult medical examination without abnormal findings: Secondary | ICD-10-CM

## 2022-10-19 NOTE — Patient Instructions (Signed)
[name] , Thank you for taking time to come for your Medicare Wellness Visit. I appreciate your ongoing commitment to your health goals. Please review the following plan we discussed and let me know if I can assist you in the future.   Screening recommendations/referrals: Colonoscopy: 01/06/18 Mammogram: 03/20/22 Bone Density: 06/18/16 Recommended yearly ophthalmology/optometry visit for glaucoma screening and checkup Recommended yearly dental visit for hygiene and checkup  Vaccinations: Influenza vaccine: 07/13/22 Pneumococcal vaccine: 09/22/14 Tdap vaccine: 01/26/22 Shingles vaccine: n/d   Covid-19:02/16/20, 03/17/20, 12/01/20, 08/08/21  Advanced directives: no  Conditions/risks identified: none  Next appointment: Follow up in one year for your annual wellness visit    Preventive Care 65 Years and Older, Female Preventive care refers to lifestyle choices and visits with your health care provider that can promote health and wellness. What does preventive care include? A yearly physical exam. This is also called an annual well check. Dental exams once or twice a year. Routine eye exams. Ask your health care provider how often you should have your eyes checked. Personal lifestyle choices, including: Daily care of your teeth and gums. Regular physical activity. Eating a healthy diet. Avoiding tobacco and drug use. Limiting alcohol use. Practicing safe sex. Taking low-dose aspirin every day. Taking vitamin and mineral supplements as recommended by your health care provider. What happens during an annual well check? The services and screenings done by your health care provider during your annual well check will depend on your age, overall health, lifestyle risk factors, and family history of disease. Counseling  Your health care provider may ask you questions about your: Alcohol use. Tobacco use. Drug use. Emotional well-being. Home and relationship well-being. Sexual  activity. Eating habits. History of falls. Memory and ability to understand (cognition). Work and work Statistician. Reproductive health. Screening  You may have the following tests or measurements: Height, weight, and BMI. Blood pressure. Lipid and cholesterol levels. These may be checked every 5 years, or more frequently if you are over 84 years old. Skin check. Lung cancer screening. You may have this screening every year starting at age 66 if you have a 30-pack-year history of smoking and currently smoke or have quit within the past 15 years. Fecal occult blood test (FOBT) of the stool. You may have this test every year starting at age 26. Flexible sigmoidoscopy or colonoscopy. You may have a sigmoidoscopy every 5 years or a colonoscopy every 10 years starting at age 37. Hepatitis C blood test. Hepatitis B blood test. Sexually transmitted disease (STD) testing. Diabetes screening. This is done by checking your blood sugar (glucose) after you have not eaten for a while (fasting). You may have this done every 1-3 years. Bone density scan. This is done to screen for osteoporosis. You may have this done starting at age 8. Mammogram. This may be done every 1-2 years. Talk to your health care provider about how often you should have regular mammograms. Talk with your health care provider about your test results, treatment options, and if necessary, the need for more tests. Vaccines  Your health care provider may recommend certain vaccines, such as: Influenza vaccine. This is recommended every year. Tetanus, diphtheria, and acellular pertussis (Tdap, Td) vaccine. You may need a Td booster every 10 years. Zoster vaccine. You may need this after age 29. Pneumococcal 13-valent conjugate (PCV13) vaccine. One dose is recommended after age 27. Pneumococcal polysaccharide (PPSV23) vaccine. One dose is recommended after age 91. Talk to your health care provider about which screenings  and vaccines  you need and how often you need them. This information is not intended to replace advice given to you by your health care provider. Make sure you discuss any questions you have with your health care provider. Document Released: 11/18/2015 Document Revised: 07/11/2016 Document Reviewed: 08/23/2015 Elsevier Interactive Patient Education  2017  Prevention in the Home Falls can cause injuries. They can happen to people of all ages. There are many things you can do to make your home safe and to help prevent falls. What can I do on the outside of my home? Regularly fix the edges of walkways and driveways and fix any cracks. Remove anything that might make you trip as you walk through a door, such as a raised step or threshold. Trim any bushes or trees on the path to your home. Use bright outdoor lighting. Clear any walking paths of anything that might make someone trip, such as rocks or tools. Regularly check to see if handrails are loose or broken. Make sure that both sides of any steps have handrails. Any raised decks and porches should have guardrails on the edges. Have any leaves, snow, or ice cleared regularly. Use sand or salt on walking paths during winter. Clean up any spills in your garage right away. This includes oil or grease spills. What can I do in the bathroom? Use night lights. Install grab bars by the toilet and in the tub and shower. Do not use towel bars as grab bars. Use non-skid mats or decals in the tub or shower. If you need to sit down in the shower, use a plastic, non-slip stool. Keep the floor dry. Clean up any water that spills on the floor as soon as it happens. Remove soap buildup in the tub or shower regularly. Attach bath mats securely with double-sided non-slip rug tape. Do not have throw rugs and other things on the floor that can make you trip. What can I do in the bedroom? Use night lights. Make sure that you have a light by your bed that  is easy to reach. Do not use any sheets or blankets that are too big for your bed. They should not hang down onto the floor. Have a firm chair that has side arms. You can use this for support while you get dressed. Do not have throw rugs and other things on the floor that can make you trip. What can I do in the kitchen? Clean up any spills right away. Avoid walking on wet floors. Keep items that you use a lot in easy-to-reach places. If you need to reach something above you, use a strong step stool that has a grab bar. Keep electrical cords out of the way. Do not use floor polish or wax that makes floors slippery. If you must use wax, use non-skid floor wax. Do not have throw rugs and other things on the floor that can make you trip. What can I do with my stairs? Do not leave any items on the stairs. Make sure that there are handrails on both sides of the stairs and use them. Fix handrails that are broken or loose. Make sure that handrails are as long as the stairways. Check any carpeting to make sure that it is firmly attached to the stairs. Fix any carpet that is loose or worn. Avoid having throw rugs at the top or bottom of the stairs. If you do have throw rugs, attach them to the floor with carpet tape.  Make sure that you have a light switch at the top of the stairs and the bottom of the stairs. If you do not have them, ask someone to add them for you. What else can I do to help prevent falls? Wear shoes that: Do not have high heels. Have rubber bottoms. Are comfortable and fit you well. Are closed at the toe. Do not wear sandals. If you use a stepladder: Make sure that it is fully opened. Do not climb a closed stepladder. Make sure that both sides of the stepladder are locked into place. Ask someone to hold it for you, if possible. Clearly mark and make sure that you can see: Any grab bars or handrails. First and last steps. Where the edge of each step is. Use tools that help you  move around (mobility aids) if they are needed. These include: Canes. Walkers. Scooters. Crutches. Turn on the lights when you go into a dark area. Replace any light bulbs as soon as they burn out. Set up your furniture so you have a clear path. Avoid moving your furniture around. If any of your floors are uneven, fix them. If there are any pets around you, be aware of where they are. Review your medicines with your doctor. Some medicines can make you feel dizzy. This can increase your chance of falling. Ask your doctor what other things that you can do to help prevent falls. This information is not intended to replace advice given to you by your health care provider. Make sure you discuss any questions you have with your health care provider. Document Released: 08/18/2009 Document Revised: 03/29/2016 Document Reviewed: 11/26/2014 Elsevier Interactive Patient Education  2017 [employer].

## 2022-10-19 NOTE — Progress Notes (Signed)
Virtual Visit via Telephone Note  I connected with  [name] on 10/19/22 at 12:00 PM EST by telephone and verified that I am speaking with the correct person using two identifiers.  Location: Patient: home Provider: Anthony Persons participating in the virtual visit: [place]   I discussed the limitations, risks, security and privacy concerns of performing an evaluation and management service by telephone and the availability of in person appointments. The patient expressed understanding and agreed to proceed.  Interactive audio and video telecommunications were attempted between this nurse and patient, however failed, due to patient having technical difficulties OR patient did not have access to video capability.  We continued and completed visit with audio only.  Some vital signs may be absent or patient reported.   Dionisio David, LPN  Subjective:   [name] is a 76 y.o. female who presents for Medicare Annual (Subsequent) preventive examination.  Review of Systems     Cardiac Risk Factors include: advanced age (>43mn, >>67women);hypertension     Objective:    Today's Vitals   10/19/22 1203  PainSc: 4    There is no height or weight on file to calculate BMI.     10/19/2022   12:08 PM 08/15/2022    1:27 PM 01/26/2022    6:10 PM 01/26/2022   12:34 PM 10/13/2021   11:26 AM 08/15/2021    2:29 PM 10/11/2020   11:22 AM  Advanced Directives  Does Patient Have a Medical Advance Directive? No No  No No No No  Would patient like information on creating a medical advance directive? No - Patient declined No - Patient declined No - Patient declined  Yes (MAU/Ambulatory/Procedural Areas - Information given) No - Patient declined Yes (MAU/Ambulatory/Procedural Areas - Information given)    Current Medications (verified) Outpatient Encounter Medications as of 10/19/2022  Medication Sig   acetaminophen (TYLENOL) 650 MG CR tablet Take 2  tablets (1,300 mg total) by mouth 2 (two) times daily as needed for pain.   albuterol (VENTOLIN HFA) 108 (90 Base) MCG/ACT inhaler INHALE 2 PUFFS BY MOUTH EVERY 6 HOURS AS NEEDED FOR WHEEZING AND SHORTNESS OF BREATH   aspirin EC 81 MG tablet Take 81 mg by mouth at bedtime. Melanoma prevention   furosemide (LASIX) 40 MG tablet TAKE 1 TABLET (40 MG TOTAL) BY MOUTH DAILY. TAKE SECOND DOSE IF NEEDED FOR LEG SWELLING/WEIGHT GAIN   loratadine (CLARITIN) 10 MG tablet Take 10 mg by mouth daily.   losartan (COZAAR) 50 MG tablet TAKE 1 TABLET BY MOUTH EVERY DAY   montelukast (SINGULAIR) 10 MG tablet TAKE 1 TABLET BY MOUTH EVERY DAY   Multiple Vitamin (MULTIVITAMIN WITH MINERALS) TABS tablet Take 1 tablet by mouth daily.   Polyvinyl Alcohol-Povidone (TEARS PLUS OP) Place 1 drop into both eyes daily as needed (dry eyes/ redness/ burning).    potassium chloride SA (KLOR-CON M20) 20 MEQ tablet TAKE 1 TABLET BY MOUTH TWICE A DAY   PRESCRIPTION MEDICATION CPAP   Probiotic Product (PROBIOTIC DAILY PO) Take 1 tablet by mouth daily.    triamcinolone (NASACORT) 55 MCG/ACT AERO nasal inhaler Place 2 sprays into the nose daily. In each nostril   Vitamin D, Ergocalciferol, (DRISDOL) 1.25 MG (50000 UNIT) CAPS capsule TAKE 1 CAPSULE BY MOUTH ONCE A WEEK   No facility-administered encounter medications on file as of 10/19/2022.    Allergies (verified) Sulfa antibiotics, Voltaren [diclofenac sodium], Latex, Tape, Doxycycline, and Other   History: Past Medical  History:  Diagnosis Date   Allergy    Anesthesia complication    trouble waking up 2/2 CPAP   Arthritis    Asthma in adult    Cataract 2019   left eye resolved with surgery   Cervical spondylosis 2012   Jacelyn Grip, [place])   Choroidal nevus of left eye 03/22/2016   Chronic venous insufficiency 2016   severe reflux with painful varicosities sees [facility]   Clotting disorder ([place])    due to vein ablation    Cognitive dysfunction    Complication of anesthesia     difficulty coming out due to sleep apnea per pt   Difficult intubation    PATIENT DENIES    Diverticulosis    per ct scans 09-2016 and 01-2017   DVT (deep venous thrombosis) ([place]) 2011   small, developed after venous ablation   DVT (deep venous thrombosis) ([place])    Family history of adverse reaction to anesthesia    O2 levels drop upon waking    Hearing loss sensory, bilateral 2013   mod-severe high freq sensorineural ([facility])   Hiatal hernia     09-2016 ct scan HP at cancer center    History of kidney stones 1980s   History of radiation therapy 07/19/11-08/25/2011   RIGHT AXILLARY REGION/METASTATIC   Hypertension    Melanoma ([place]) 06/29/2010   MALIGNANT MELANOMA R SHOULDER/SUPRASCAPULAR BACK s/p interferon chemo and XRT   Neuromuscular disorder (H[facility])    muscle spasms   OSA (obstructive sleep apnea)    on CPAP   Pneumonia    2001   RLS (restless legs syndrome)    Seasonal allergies    Sleep apnea    wears cpap   Tinnitus    Unspecified vitamin D deficiency 03/18/2013   Varicose veins of left lower extremity    Past Surgical History:  Procedure Laterality Date   ABI  2016   WNL   ANTERIOR CERVICAL DECOMP/DISCECTOMY FUSION N/A 08/22/2015   ANTERIOR CERVICAL DECOMPRESSION FUSION C3/4 - interbody graft and anterior plate; Kristeen Miss, MD   ANTERIOR CERVICAL DECOMP/DISCECTOMY FUSION N/A 07/02/2016   Procedure: Cervical four- five Cervical five- six Cervical six- seven Anterior cervical decompression/diskectomy/fusion;  Surgeon: Kristeen Miss, MD;  Location: [facility];  Service: Neurosurgery;  Laterality: N/A;  C4-5 C5-6 C6-7 Anterior cervical decompression/diskectomy/fusion   BREAST BIOPSY Left 2013   BENIGN   BREAST SURGERY Right 1990   BX    CARDIOVASCULAR STRESS TEST  2010   normal stress test, EF 66%   CATARACT EXTRACTION W/ INTRAOCULAR LENS IMPLANT Left 2019   Dr. Kathrin Penner   COLONOSCOPY  03/2005   benign polyp, int hem, rpt 5 yrs ([place], [place])    COLONOSCOPY WITH PROPOFOL N/A 01/06/2018   diverticulosis, decreased sphincter tone, no f/u needed [name], Kirke Corin, MD)   [place] / EXCISION  2012   RIGHT   DEXA  06/2016   WNL   DILATION AND CURETTAGE OF UTERUS  2010   ENDOVENOUS ABLATION SAPHENOUS VEIN W/ LASER Left 2010   [facility]   ENDOVENOUS ABLATION SAPHENOUS VEIN W/ LASER Left 2016   accessory branch [facility] Kellie Simmering)   ESOPHAGOGASTRODUODENOSCOPY  2006   mod chronic carditis, no H pylori ([place], [place])   INTRAVASCULAR ULTRASOUND/IVUS N/A 08/03/2019   Waynetta Sandy, [place]   left   LOWER EXTREMITY VENOGRAPHY Left 08/03/2019   Waynetta Sandy, MD   LUMBAR LAMINECTOMY  2001   L4/5   MELANOMA EXCISION  2011   Right shoulder, with sentinel lymph node biopsy   PERIPHERAL VASCULAR INTERVENTION Left 08/03/2019   Stent of left common iliac vein with 14 x 60 mm Vici Donzetta Matters, [place] Dom, MD)   [facility] REMOVAL  12/05/2011   Procedure: REMOVAL PORT-A-CATH;  Surgeon: Rolm Bookbinder, MD;  Location: [place];  Service: General;  Laterality: N/A;  left port removal   PORTACATH PLACEMENT     left subclavian   UPPER GASTROINTESTINAL ENDOSCOPY     Family History  Problem Relation Age of Onset   Cancer Mother 57       uterine   CAD Father 69       MI   Hypertension Father 91   Alzheimer's disease Father 108   Heart attack Father    Diabetes Sister    Cancer Paternal Grandmother        melanoma, possibly   Cancer Cousin        x2, breast   Colon cancer Neg Hx    Colon polyps Neg Hx    Rectal cancer Neg Hx    Stomach cancer Neg Hx    Social History   Socioeconomic History   Marital status: Divorced    Spouse name: Not on file   Number of children: 0   Years of education: Not on file   Highest education level: Some college, no degree  Occupational History   Occupation: retired    Fish farm manager: RETIRED    Comment: Government social research officer    Tobacco Use   Smoking status: Former    Types: Cigarettes    Quit date: 11/05/1965    Years since quitting: 56.9    Passive exposure: Never   Smokeless tobacco: Never   Tobacco comments:    socially as a teen  Media planner   Vaping Use: Former  Substance and Sexual Activity   Alcohol use: No    Alcohol/week: 0.0 standard drinks of alcohol   Drug use: No   Sexual activity: Never  Other Topics Concern   Not on file  Social History Narrative   L handed   01/04/22 Lives alone in RV.  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   Edu: some college   Diet: poor - good water, rare fruits/vegetables   Social Determinants of Health   Financial Resource Strain: Low Risk  (10/19/2022)   Overall Financial Resource Strain (CARDIA)    Difficulty of Paying Living Expenses: Not very hard  Food Insecurity: No Food Insecurity (10/19/2022)   Hunger Vital Sign    Worried About Running Out of Food in the Last Year: Never true    Ran Out of Food in the Last Year: Never true  Transportation Needs: No Transportation Needs (10/19/2022)   PRAPARE - Hydrologist (Medical): No    Lack of Transportation (Non-Medical): No  Physical Activity: Sufficiently Active (10/19/2022)   Exercise Vital Sign    Days of Exercise per Week: 7 days    Minutes of Exercise per Session: 30 min  Stress: No Stress Concern Present (10/19/2022)   [place]    Feeling of Stress : Not at all  Social Connections: Moderately Isolated (10/19/2022)   Social Connection and Isolation Panel [NHANES]    Frequency of Communication with Friends and Family: More than three times a week  Frequency of Social Gatherings with Friends and Family: More than three times a week    Attends Religious Services: Never    Marine scientist or Organizations: Yes    Attends Arts administrator: More than 4 times per year    Marital Status: Divorced    Tobacco Counseling Counseling given: Not Answered Tobacco comments: socially as a teen   Clinical Intake:  Pre-visit preparation completed: Yes  Pain : 0-10 Pain Score: 4  Pain Location: Leg Pain Orientation: Left Pain Radiating Towards: wound on left leg     Nutritional Risks: None Diabetes: No  How often do you need to have someone help you when you read instructions, pamphlets, or other written materials from your doctor or pharmacy?: 1 - Never  Diabetic?no  Interpreter Needed?: No  Information entered by :: Kirke Shaggy, LPN   Activities of Daily Living    10/19/2022   12:09 PM 01/26/2022    6:06 PM  In your present state of health, do you have any difficulty performing the following activities:  Hearing? 0   Vision? 0   Difficulty concentrating or making decisions? 0   Walking or climbing stairs? 0   Dressing or bathing? 0   Doing errands, shopping? 0 0  Preparing Food and eating ? N   Using the Toilet? N   In the past six months, have you accidently leaked urine? N   Do you have problems with loss of bowel control? N   Managing your Medications? N   Managing your Finances? N   Housekeeping or managing your Housekeeping? N     Patient Care Team: [name], MD as PCP - General (Family Medicine) Marin Olp Rudell Cobb, MD as Consulting Physician (Oncology) Kristeen Miss, MD as Consulting Physician (Neurosurgery) Danella Sensing, MD as Consulting Physician (Dermatology) Garrel Ridgel, [place] as Consulting Physician (Podiatry) Juanito Doom, MD as Consulting Physician (Pulmonary Disease) Shon Hough, MD as Consulting Physician (Ophthalmology)  Indicate any recent Medical Services you may have received from other than [facility] providers in the past year (date may be approximate).     Assessment:   This is a routine wellness examination for Lena.  Hearing/Vision screen Hearing  Screening - Comments:: No aids Vision Screening - Comments:: No glasses- [facility]  Dietary issues and exercise activities discussed: Current Exercise Habits: Home exercise routine, Type of exercise: walking, Time (Minutes): 30, Frequency (Times/Week): 7, Weekly Exercise (Minutes/Week): 210, Intensity: Mild   Goals Addressed             This Visit's Progress    DIET - EAT MORE FRUITS AND VEGETABLES         Depression Screen    10/19/2022   12:06 PM 04/20/2022    2:33 PM 10/13/2021   11:32 AM 11/09/2020   11:12 AM 10/11/2020   11:25 AM 08/22/2020    2:44 PM 10/08/2019    3:34 PM  PHQ 2/9 Scores  PHQ - 2 Score 0 0 0 0 0 0 0  PHQ- 9 Score 0   0 0  0    Fall Risk    10/19/2022   12:09 PM 04/20/2022    2:32 PM 10/13/2021   11:29 AM 11/09/2020   11:12 AM 10/11/2020   11:24 AM  Fall Risk   Falls in the past year? 0 0 1 0 1  Comment     tripped and fell  Number falls in past yr: 0 0 1 0 0  Injury with Fall? 0 0 0 0 0  Risk for fall due to : No Fall Risks  Other (Comment)  Medication side effect  Risk for fall due to: Comment   tripped    Follow up Falls prevention discussed;Falls evaluation completed  Falls prevention discussed Falls evaluation completed Falls evaluation completed;Falls prevention discussed    FALL RISK PREVENTION PERTAINING TO THE HOME:  Any stairs in or around the home? Yes  If so, are there any without handrails? No  Home free of loose throw rugs in walkways, pet beds, electrical cords, etc? Yes  Adequate lighting in your home to reduce risk of falls? Yes   ASSISTIVE DEVICES UTILIZED TO PREVENT FALLS:  Life alert? No  Use of a cane, walker or w/c? Yes  Grab bars in the bathroom? No  Shower chair or bench in shower? No  Elevated toilet seat or a handicapped toilet? Yes   Cognitive Function:    10/11/2020   11:30 AM 10/08/2019    3:37 PM 10/01/2018   10:41 AM 09/25/2017   11:11 AM 09/24/2016   11:42 AM  MMSE - Mini Mental State Exam   Orientation to time '5 5 5 5 5  '$ Orientation to Place '5 5 5 5 5  '$ Registration '3 3 3 3 3  '$ Attention/ Calculation 5 5 0 0 0  Recall '3 3 3 3 3  '$ Language- name 2 objects   0 0 0  Language- repeat '1 1 1 1 1  '$ Language- follow 3 step command   '3 3 3  '$ Language- read & follow direction   0 0 0  Write a sentence   0 0 0  Copy design   0 0 0  Total score   '20 20 20      '$ 07/16/2022    1:12 PM 01/04/2022   12:51 PM  Montreal Cognitive Assessment   Visuospatial/ Executive (0/5) 5 3  Naming (0/3) 3 3  Attention: Read list of digits (0/2) 2 2  Attention: Read list of letters (0/1) 1 1  Attention: Serial 7 subtraction starting at 100 (0/3) 3 0  Language: Repeat phrase (0/2) 2 2  Language : Fluency (0/1) 1 1  Abstraction (0/2) 2 2  Delayed Recall (0/5) 4 1  Orientation (0/6) 6 6  Total 29 21      10/19/2022   12:15 PM 10/13/2021   11:36 AM  6CIT Screen  What Year? 0 points 0 points  What month? 0 points 0 points  What time? 0 points 0 points  Count back from 20 0 points 0 points  Months in reverse 0 points 2 points  Repeat phrase 0 points 0 points  Total Score 0 points 2 points    Immunizations Immunization History  Administered Date(s) Administered   COVID-19, mRNA, vaccine(Comirnaty)12 years and older 09/02/2022   Fluad Quad(high Dose 65+) 10/14/2019, 08/22/2020, 08/22/2021, 07/13/2022   Influenza, High Dose Seasonal PF 08/13/2016, 07/31/2017, 07/28/2018   Influenza, Seasonal, Injecte, Preservative Fre 09/01/2009   Influenza,inj,Quad PF,6+ Mos 08/06/2013, 09/17/2014, 08/18/2015   Influenza-Unspecified 08/23/2019   Moderna Covid-19 Vaccine Bivalent Booster 76yr & up 08/08/2021   Moderna Sars-Covid-2 Vaccination 02/16/2020, 03/17/2020, 12/01/2020   Pneumococcal Conjugate-13 09/22/2014   Pneumococcal Polysaccharide-23 05/15/2013   Td 10/14/2019   Tdap 01/26/2022    TDAP status: Up to date  Flu Vaccine status: Up to date  Pneumococcal vaccine status: Up to date  Covid-19  vaccine status: Completed vaccines  Qualifies for Shingles Vaccine? Yes  Zostavax completed No   Shingrix Completed?: No.    Education has been provided regarding the importance of this vaccine. Patient has been advised to call insurance company to determine out of pocket expense if they have not yet received this vaccine. Advised may also receive vaccine at local pharmacy or Health Dept. Verbalized acceptance and understanding.  Screening Tests Health Maintenance  Topic Date Due   Zoster Vaccines- Shingrix (1 of 2) Never done   COVID-19 Vaccine (6 - 2023-24 season) 10/28/2022   MAMMOGRAM  03/21/2023   Medicare Annual Wellness (AWV)  10/20/2023   DTaP/Tdap/Td (3 - Td or Tdap) 01/27/2032   Pneumonia Vaccine 23+ Years old  Completed   INFLUENZA VACCINE  Completed   DEXA SCAN  Completed   Hepatitis C Screening  Completed   HPV VACCINES  Aged Out   COLONOSCOPY (Pts 45-33yr Insurance coverage will need to be confirmed)  Discontinued    Health Maintenance  Health Maintenance Due  Topic Date Due   Zoster Vaccines- Shingrix (1 of 2) Never done    Colorectal cancer screening: Type of screening: Colonoscopy. Completed 01/06/18. Repeat every 10 years- aged out  Mammogram status: Completed 03/20/22. Repeat every year  Bone Density status: Completed 06/18/16. Results reflect: Bone density results: NORMAL. Repeat every 5 years.- speak w/ doctor  Lung Cancer Screening: (Low Dose CT Chest recommended if Age 76-80years, 30 pack-year currently smoking OR have quit w/in 15years.) does not qualify.   Additional Screening:  Hepatitis C Screening: does qualify; Completed 09/19/15  Vision Screening: Recommended annual ophthalmology exams for early detection of glaucoma and other disorders of the eye. Is the patient up to date with their annual eye exam?  Yes  Who is the provider or what is the name of the office in which the patient attends annual eye exams? [place] is not established  with a provider, would they like to be referred to a provider to establish care? No .   Dental Screening: Recommended annual dental exams for proper oral hygiene  Community Resource Referral / Chronic Care Management: CRR required this visit?  No   CCM required this visit?  No      Plan:     I have personally reviewed and noted the following in the patient's chart:   Medical and social history Use of alcohol, tobacco or illicit drugs  Current medications and supplements including opioid prescriptions. Patient is not currently taking opioid prescriptions. Functional ability and status Nutritional status Physical activity Advanced directives List of other physicians Hospitalizations, surgeries, and ER visits in previous 12 months Vitals Screenings to include cognitive, depression, and falls Referrals and appointments  In addition, I have reviewed and discussed with patient certain preventive protocols, quality metrics, and best practice recommendations. A written personalized care plan for preventive services as well as general preventive health recommendations were provided to patient.     LDionisio David LPN   [id number]  Nurse Notes: wants to discuss getting the RSV vaccine

## 2022-10-22 ENCOUNTER — Other Ambulatory Visit: Payer: Self-pay | Admitting: Family Medicine

## 2022-10-22 ENCOUNTER — Encounter (HOSPITAL_BASED_OUTPATIENT_CLINIC_OR_DEPARTMENT_OTHER): Payer: Medicare Other | Admitting: Internal Medicine

## 2022-10-22 DIAGNOSIS — E559 Vitamin D deficiency, unspecified: Secondary | ICD-10-CM

## 2022-10-22 DIAGNOSIS — Z13220 Encounter for screening for lipoid disorders: Secondary | ICD-10-CM

## 2022-10-22 DIAGNOSIS — L97222 Non-pressure chronic ulcer of left calf with fat layer exposed: Secondary | ICD-10-CM | POA: Diagnosis not present

## 2022-10-22 DIAGNOSIS — E611 Iron deficiency: Secondary | ICD-10-CM

## 2022-10-22 DIAGNOSIS — Q969 Turner's syndrome, unspecified: Secondary | ICD-10-CM | POA: Diagnosis not present

## 2022-10-22 DIAGNOSIS — L97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d: Secondary | ICD-10-CM | POA: Diagnosis not present

## 2022-10-22 DIAGNOSIS — L97818 Non-pressure chronic ulcer of other part of right lower leg with other specified severity: Secondary | ICD-10-CM | POA: Diagnosis not present

## 2022-10-22 DIAGNOSIS — G629 Polyneuropathy, unspecified: Secondary | ICD-10-CM | POA: Diagnosis not present

## 2022-10-22 DIAGNOSIS — Z131 Encounter for screening for diabetes mellitus: Secondary | ICD-10-CM

## 2022-10-22 DIAGNOSIS — L03115 Cellulitis of right lower limb: Secondary | ICD-10-CM | POA: Diagnosis not present

## 2022-10-22 DIAGNOSIS — I872 Venous insufficiency (chronic) (peripheral): Secondary | ICD-10-CM | POA: Diagnosis not present

## 2022-10-22 DIAGNOSIS — G4733 Obstructive sleep apnea (adult) (pediatric): Secondary | ICD-10-CM | POA: Diagnosis not present

## 2022-10-23 NOTE — Progress Notes (Signed)
KIENNA, MONCADA ([id number]) [id number] Page 1 of 8 Visit Report for 10/22/2022 Debridement Details Patient Name: Date of Service: [name], [name] 10/22/2022 2:30 PM Medical Record Number: [id number] Patient Account Number: [id number] Date of Birth/Sex: Treating RN: [birth year] (76 y.o. [name] Primary Care Provider: [name] Other Clinician: Referring Provider: Treating Provider/Extender: Pati Gallo in Treatment: 12 Debridement Performed for Assessment: Wound #2 Left,Posterior Lower Leg Performed By: Physician [name] Dillon., MD Debridement Type: Debridement Severity of Tissue Pre Debridement: Fat layer exposed Level of Consciousness (Pre-procedure): Awake and Alert Pre-procedure Verification/Time Out Yes - 15:19 Taken: Start Time: 15:20 Pain Control: Lidocaine 5% topical ointment T Area Debrided (L x W): otal 6 (cm) x 3 (cm) = 18 (cm) Tissue and other material debrided: [place] Level: Non-Viable Tissue Debridement Description: Selective/Open Wound Instrument: Curette Bleeding: Minimum Hemostasis Achieved: Pressure Response to Treatment: Procedure was tolerated well Level of Consciousness (Post- Awake and Alert procedure): Post Debridement Measurements of Total Wound Length: (cm) 6 Width: (cm) 3 Depth: (cm) 0.1 Volume: (cm) 1.414 Character of Wound/Ulcer Post Debridement: Requires Further Debridement Severity of Tissue Post Debridement: Fat layer exposed Post Procedure Diagnosis Same as Pre-procedure Notes Scribed for Dr. Dellia Nims by Blanche East, RN Electronic Signature(s) Signed: 10/22/2022 5:06:55 PM By: Linton Ham MD Signed: 10/22/2022 5:09:12 PM By: Blanche East RN Entered By: Blanche East on 10/22/2022 15:21:17 -------------------------------------------------------------------------------- HPI Details Patient Name: Date of Service: [name] 10/22/2022 2:30  PM Medical Record Number: [id number] Patient Account Number: [id number] Date of Birth/Sex: Treating RN: 11/01/1946 (76 y.o. F) Primary Care Provider: [name] Other Clinician: Referring Provider: Treating Provider/Extender: Pati Gallo in Treatment: [address] [place] ([id number]) [id number] Page 2 of 8 History of Present Illness HPI Description: ADMISSION This is a 76 year old woman with a history of chronic venous insufficiency status post saphenous vein ablations in 2010 and 2016. 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 She presents today with an open venous ulcer on her left lower leg. It has been present for a little over 2 weeks. She saw her primary care provider who apparently swabbed the wound and grew out Pseudomonas. She just completed a course of ciprofloxacin for this. ABI was 0.91. She reports that she has had previous issues with ulcers in this same location, stemming back to a punch biopsy taken by a dermatologist many years ago. She has had several skin substitutes applied to the area that have ultimately resulted in healing on prior occasions. She has 2 small ulcers on her left medial lower leg. There is slough accumulation in both of them. The more anterior of the 2 is quite small and has some soft slough on the surface, underneath which there is good granulation tissue. The more medial wound also has slough accumulation, but the underlying surface is fairly fibrotic and gritty. This is consistent with her provided history of multiple ulcers in the same location. 08/03/2022: The anterior wound is smaller today with just a little bit of slough accumulation. The more medial wound continues to be very sensitive and fibrotic with slough buildup. 08/13/2022: The anterior wound is closed. The more medial wound remains sensitive with a fairly fibrotic surface. There is more granulation tissue  filling in, however, and there is less slough than on prior occasions. 08/20/2022: The anterior wound remains closed. The more medial wound has filled with granulation tissue but still has a fair amount of slough on the surface and remains  fairly tender. Unfortunately, she has developed 2 small ulcers just proximal to this. The fat layer is exposed in each. She says that over the weekend, she felt a burning sensation in that location. 08/27/2022: The 2 small wounds proximal to the main wound have merged into a single site. The wounds look a little bit dry, but they are quite clean without significant slough accumulation. They remain quite tender. 09/03/2022: All of the wounds have now merged into 1 large wound. There is a strong odor coming from the wound and the surface is not particularly viable. It is extremely painful for her today. 09/10/2022: The wound is less black and purple this week but still does not look particularly viable. The surface is desiccated. The culture that I took last week returned with a polymicrobial population including Pseudomonas. I prescribed both Augmentin and levofloxacin. She has not yet picked up levofloxacin, as her pharmacy only notified her yesterday that it was ready. We have ordered a Keystone topical compounded antibiotic, but it has not yet come. 09/17/2022: The wound surface is markedly improved today. There is still an area of grayish-looking muscle but the rest appears significantly more viable. There is still a layer of slough on the surface. She still has a couple days left of oral antibiotic therapy. She has her [employer] compounded topical antibiotic with her today. 09/24/2022: She has completed her oral antibiotic therapy. The wound surface is much cleaner today and more viable without any necrotic tissue. It is a bit desiccated, however. 10/01/2022: The moisture balance of the wound has improved. There is a layer of yellow slough on the surface, but  beneath this, the wound is more pink. 10/08/2022: The wound is smaller and cleaner today with a layer of slough present. She is having less pain. 12/18; this patient has a new wound on the right lateral ankle which apparently started after a cat scratched her. Secondarily she managed to drop a cake pan on the same area. This is very painful. The original wound was on the left posterior calf we have been using [employer] under an [employer]. The [employer] boot fell down and apparently she was in for a nurse visit perhaps last week and that 1 fell down as well This patient has central venous mediated hypertension. For member correctly she has a stent placed in the left common iliac artery by Dr. Donzetta Matters some years ago [facility] Thurner syndrome] she also has had venous ablations and I believe a history of a DVT The patient has compression pumps at home but she does not use them Electronic Signature(s) Signed: 10/22/2022 5:06:55 PM By: Linton Ham MD Entered By: Linton Ham on 10/22/2022 16:34:57 -------------------------------------------------------------------------------- Physical Exam Details Patient Name: Date of Service: [name], [name] 10/22/2022 2:30 PM Medical Record Number: [id number] Patient Account Number: [id number] Date of Birth/Sex: Treating RN: 06/15/1946 (76 y.o. F) Primary Care Provider: [name] Other Clinician: Referring Provider: Treating Provider/Extender: Pati Gallo in Treatment: 12 Constitutional Patient is hypertensive.. Pulse regular and within target range for patient.Marland Kitchen Respirations regular, non-labored and within target range.. Temperature is normal and within the target range for the patient.Marland Kitchen Appears in no distress. Notes Wound exam; left posterior calf debrided with a curette although she tolerates this very poorly. Her edema control in the left leg is also quite poor [name], [name] ([id number])  [id number] Page 3 of 8 On the right lateral ankle a small quarter sized but very painful wound very warm around this. Necrotic  surface there is no purulence. There is too much pain for me to debride this Electronic Signature(s) Signed: 10/22/2022 5:06:55 PM By: Linton Ham MD Entered By: Linton Ham on 10/22/2022 16:36:03 -------------------------------------------------------------------------------- Physician Orders Details Patient Name: Date of Service: [name], [name] 10/22/2022 2:30 PM Medical Record Number: [id number] Patient Account Number: [id number] Date of Birth/Sex: Treating RN: 04-May-1946 (76 y.o. [name] Primary Care Provider: [name] Other Clinician: Referring Provider: Treating Provider/Extender: Pati Gallo in Treatment: 12 Verbal / Phone Orders: No Diagnosis Coding Follow-up Appointments ppointment in 1 week. - Dr. Celine Ahr RM 4 Return A Anesthetic Wound #2 Left,Posterior Lower Leg (In clinic) Topical Lidocaine 5% applied to wound bed - in clinic, prior to debridement Bathing/ Shower/ Hygiene May shower with protection but do not get wound dressing(s) wet. Edema Control - Lymphedema / SCD / Other Left Lower Extremity Lymphedema Pumps. Use Lymphedema pumps on leg(s) 2-3 times a day for 45-60 minutes. If wearing any wraps or hose, do not remove them. Continue exercising as instructed. - Use x1 per day Avoid standing for long periods of time. Patient to wear own compression stockings every day. - Right lower extremity Moisturize legs daily. Wound Treatment Wound #2 - Lower Leg Wound Laterality: Left, Posterior Cleanser: Soap and Water 1 x Per Week/30 Days Discharge Instructions: May shower and wash wound with dial antibacterial soap and water prior to dressing change. Peri-Wound Care: Sween Lotion (Moisturizing lotion) 1 x Per Week/30 Days Discharge Instructions: Apply moisturizing  lotion as directed Topical: Keystone 1 x Per Week/30 Days Discharge Instructions: Apply as directed to wound Prim Dressing: Hydrofera Blue Classic Foam, 4x4 in 1 x Per Week/30 Days ary Discharge Instructions: Moisten with saline prior to applying to wound bed Secondary Dressing: Zetuvit Plus 4x8 in 1 x Per Week/30 Days Discharge Instructions: Apply over primary dressing as directed. Secured With: 59M [employer] Public affairs consultant Surgical T 2x10 (in/yd) 1 x Per Week/30 Days ape Discharge Instructions: Secure with tape as directed. Compression Wrap: ThreePress (3 layer compression wrap) 1 x Per Week/30 Days Discharge Instructions: Apply three layer compression as directed. Wound #3 - Ankle Wound Laterality: Right, Lateral Cleanser: Soap and Water 1 x Per Week/30 Days Discharge Instructions: May shower and wash wound with dial antibacterial soap and water prior to dressing change. Peri-Wound Care: Sween Lotion (Moisturizing lotion) 1 x Per Week/30 Days Discharge Instructions: Apply moisturizing lotion as directed Prim Dressing: Hydrofera Blue Classic Foam, 4x4 in 1 x Per Week/30 Days ary Discharge Instructions: Moisten with saline prior to applying to wound bed [name], [name] ([id number]) [id number] Page 4 of 8 Secondary Dressing: Zetuvit Plus 4x8 in 1 x Per Week/30 Days Discharge Instructions: Apply over primary dressing as directed. Secured With: 59M [employer] Public affairs consultant Surgical T 2x10 (in/yd) 1 x Per Week/30 Days ape Discharge Instructions: Secure with tape as directed. Compression Wrap: ThreePress (3 layer compression wrap) 1 x Per Week/30 Days Discharge Instructions: Apply three layer compression as directed. Patient Medications llergies: sulfa antibiotics, Voltaren, doxycycline, Neoprene, adhesive, latex A Notifications Medication Indication Start End wound infection right 10/22/2022 cephalexin ankle DOSE oral 500 mg capsule - 1 capsule oral four times  daily Electronic Signature(s) Signed: 10/22/2022 4:38:04 PM By: Linton Ham MD Entered By: Linton Ham on 10/22/2022 16:38:03 -------------------------------------------------------------------------------- Problem List Details Patient Name: Date of Service: [name] 10/22/2022 2:30 PM Medical Record Number: [id number] Patient Account Number: [id number] Date of Birth/Sex: Treating RN: 11-30-1945 (76 y.o. F) Primary Care Provider: [name]  Other Clinician: Referring Provider: Treating Provider/Extender: Pati Gallo in Treatment: 12 Active Problems ICD-10 Encounter Code Description Active Date MDM Diagnosis L97.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d9/22/2023 No Yes L97.818 Non-pressure chronic ulcer of other part of right lower leg with other specified 10/22/2022 No Yes severity I83.893 Varicose veins of bilateral lower extremities with other complications 3/78/5885 No Yes I87.2 Venous insufficiency (chronic) (peripheral) 07/27/2022 No Yes G62.9 Polyneuropathy, unspecified 07/27/2022 No Yes R60.0 Localized edema 07/27/2022 No Yes L03.115 Cellulitis of right lower limb 10/22/2022 No Yes [name], [name] ([id number]) [id number] Page 5 of 8 Inactive Problems Resolved Problems Electronic Signature(s) Signed: 10/22/2022 5:06:55 PM By: Linton Ham MD Entered By: Linton Ham on 10/22/2022 16:38:30 -------------------------------------------------------------------------------- Progress Note Details Patient Name: Date of Service: [name], [name] 10/22/2022 2:30 PM Medical Record Number: [id number] Patient Account Number: [id number] Date of Birth/Sex: Treating RN: 1945-11-23 (76 y.o. F) Primary Care Provider: [name] Other Clinician: Referring Provider: Treating Provider/Extender: Pati Gallo in Treatment:  12 Subjective History of Present Illness (HPI) ADMISSION This is a 76 year old woman with a history of chronic venous insufficiency status post saphenous vein ablations in 2010 and 2016. 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 She presents today with an open venous ulcer on her left lower leg. It has been present for a little over 2 weeks. She saw her primary care provider who apparently swabbed the wound and grew out Pseudomonas. She just completed a course of ciprofloxacin for this. ABI was 0.91. She reports that she has had previous issues with ulcers in this same location, stemming back to a punch biopsy taken by a dermatologist many years ago. She has had several skin substitutes applied to the area that have ultimately resulted in healing on prior occasions. She has 2 small ulcers on her left medial lower leg. There is slough accumulation in both of them. The more anterior of the 2 is quite small and has some soft slough on the surface, underneath which there is good granulation tissue. The more medial wound also has slough accumulation, but the underlying surface is fairly fibrotic and gritty. This is consistent with her provided history of multiple ulcers in the same location. 08/03/2022: The anterior wound is smaller today with just a little bit of slough accumulation. The more medial wound continues to be very sensitive and fibrotic with slough buildup. 08/13/2022: The anterior wound is closed. The more medial wound remains sensitive with a fairly fibrotic surface. There is more granulation tissue filling in, however, and there is less slough than on prior occasions. 08/20/2022: The anterior wound remains closed. The more medial wound has filled with granulation tissue but still has a fair amount of slough on the surface and remains fairly tender. Unfortunately, she has developed 2 small ulcers just proximal to this. The fat layer is exposed in each. She says  that over the weekend, she felt a burning sensation in that location. 08/27/2022: The 2 small wounds proximal to the main wound have merged into a single site. The wounds look a little bit dry, but they are quite clean without significant slough accumulation. They remain quite tender. 09/03/2022: All of the wounds have now merged into 1 large wound. There is a strong odor coming from the wound and the surface is not particularly viable. It is extremely painful for her today. 09/10/2022: The wound is less black and purple this week but  still does not look particularly viable. The surface is desiccated. The culture that I took last week returned with a polymicrobial population including Pseudomonas. I prescribed both Augmentin and levofloxacin. She has not yet picked up levofloxacin, as her pharmacy only notified her yesterday that it was ready. We have ordered a Keystone topical compounded antibiotic, but it has not yet come. 09/17/2022: The wound surface is markedly improved today. There is still an area of grayish-looking muscle but the rest appears significantly more viable. There is still a layer of slough on the surface. She still has a couple days left of oral antibiotic therapy. She has her [employer] compounded topical antibiotic with her today. 09/24/2022: She has completed her oral antibiotic therapy. The wound surface is much cleaner today and more viable without any necrotic tissue. It is a bit desiccated, however. 10/01/2022: The moisture balance of the wound has improved. There is a layer of yellow slough on the surface, but beneath this, the wound is more pink. 10/08/2022: The wound is smaller and cleaner today with a layer of slough present. She is having less pain. 12/18; this patient has a new wound on the right lateral ankle which apparently started after a cat scratched her. Secondarily she managed to drop a cake pan on the same area. This is very painful. The original wound was on  the left posterior calf we have been using [employer] under an [employer]. The [employer] boot fell down and apparently she was in for a nurse visit perhaps last week and that 1 fell down as well This patient has central venous mediated hypertension. For member correctly she has a stent placed in the left common iliac artery by Dr. Donzetta Matters some years ago [facility] Thurner syndrome] she also has had venous ablations and I believe a history of a DVT The patient has compression pumps at home but she does not use them [name], [name] ([id number]) [id number] Page 6 of 8 Objective Constitutional Patient is hypertensive.. Pulse regular and within target range for patient.Marland Kitchen Respirations regular, non-labored and within target range.. Temperature is normal and within the target range for the patient.Marland Kitchen Appears in no distress. Vitals Time Taken: 2:55 PM, Height: 63 in, Weight: 200 lbs, BMI: 35.4, Temperature: 98.0 F, Pulse: 73 bpm, Respiratory Rate: 18 breaths/min, Blood Pressure: 171/97 mmHg. General Notes: Wound exam; left posterior calf debrided with a curette although she tolerates this very poorly. Her edema control in the left leg is also quite poor oo On the right lateral ankle a small quarter sized but very painful wound very warm around this. Necrotic surface there is no purulence. There is too much pain for me to debride this Integumentary (Hair, Skin) Wound #2 status is Open. Original cause of wound was Blister. The date acquired was: 07/06/2022. The wound has been in treatment 12 weeks. The wound is located on the Left,Posterior Lower Leg. The wound measures 6cm length x 3cm width x 0.1cm depth; 14.137cm^2 area and 1.414cm^3 volume. There is Fat Layer (Subcutaneous Tissue) exposed. There is no tunneling or undermining noted. There is a medium amount of serosanguineous drainage noted. The wound margin is flat and intact. There is medium (34-66%) red granulation within the wound  bed. There is a medium (34-66%) amount of necrotic tissue within the wound bed including Eschar and Adherent Slough. The periwound skin appearance had no abnormalities noted for color. The periwound skin appearance did not exhibit: Excoriation, Maceration. The periwound has tenderness on palpation. Wound #3 status is  Open. Original cause of wound was Contusion/Bruise. The date acquired was: 10/19/2022. The wound is located on the Right,Lateral Ankle. The wound measures 1.7cm length x 2cm width x 0.1cm depth; 2.67cm^2 area and 0.267cm^3 volume. There is no tunneling or undermining noted. There is a medium amount of purulent drainage noted. Foul odor after cleansing was noted. There is small (1-33%) pale, friable granulation within the wound bed. There is a large (67-100%) amount of necrotic tissue within the wound bed including Eschar and Adherent Slough. The periwound skin appearance exhibited: Excoriation, Maceration, Erythema. The surrounding wound skin color is noted with erythema. 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 There was a Selective/Open Wound Non-Viable Tissue Debridement with a total area of 18 sq cm performed by [name] Dillon., MD. With the following instrument(s): Curette Material removed includes [facility] after achieving pain control using Lidocaine 5% topical ointment. A time out was conducted at 15:19, prior to the start of the procedure. A Minimum  amount of bleeding was controlled with Pressure. The procedure was tolerated well. Post Debridement Measurements: 6cm length x 3cm width x 0.1cm depth; 1.414cm^3 volume. Character of Wound/Ulcer Post Debridement requires further debridement. Severity of Tissue Post Debridement is: Fat layer exposed. Post procedure Diagnosis Wound #2: Same as Pre-Procedure General Notes: Scribed for Dr. Dellia Nims by Blanche East, RN. Pre-procedure diagnosis of Wound #2 is a Venous Leg Ulcer located on the Left,Posterior Lower Leg . There was a Three Layer Compression Therapy Procedure by Blanche East, RN. Post procedure Diagnosis Wound #2: Same as Pre-Procedure Plan Follow-up Appointments: Return Appointment in 1 week. - Dr. Celine Ahr RM 4 Anesthetic: Wound #2 [place] Lower Leg: (In clinic) Topical Lidocaine 5% applied to wound bed - in clinic, prior to debridement Bathing/ Shower/ Hygiene: [name], [name] ([id number]) [id number] Page 7 of 8 May shower with protection but do not get wound dressing(s) wet. Edema Control - Lymphedema / SCD / Other: Lymphedema Pumps. Use Lymphedema pumps on leg(s) 2-3 times a day for 45-60 minutes. If wearing any wraps or hose, do not remove them. Continue exercising as instructed. - Use x1 per day Avoid standing for long periods of time. Patient to wear own compression stockings every day. - Right lower extremity Moisturize legs daily. The following medication(s) was prescribed: cephalexin oral 500 mg capsule 1 capsule oral four times daily for wound infection right ankle starting 10/22/2022 WOUND #2: - Lower Leg Wound Laterality: Left, Posterior Cleanser: Soap and Water 1 x Per Week/30 Days Discharge Instructions: May shower and wash wound with dial antibacterial soap and water prior to dressing change. Peri-Wound Care: Sween Lotion (Moisturizing lotion) 1 x Per Week/30 Days Discharge Instructions: Apply moisturizing lotion as  directed Topical: Keystone 1 x Per Week/30 Days Discharge Instructions: Apply as directed to wound Prim Dressing: Hydrofera Blue Classic Foam, 4x4 in 1 x Per Week/30 Days ary Discharge Instructions: Moisten with saline prior to applying to wound bed Secondary Dressing: Zetuvit Plus 4x8 in 1 x Per Week/30 Days Discharge Instructions: Apply over primary dressing as directed. Secured With: [employer] Surgical T 2x10 (in/yd) 1 x Per Week/30 Days ape Discharge Instructions: Secure with tape as directed. Com pression Wrap: ThreePress (3 layer compression wrap) 1 x Per Week/30 Days Discharge Instructions: Apply three layer compression as directed. WOUND #3: - Ankle Wound Laterality: Right, Lateral Cleanser: Soap and Water 1 x Per Week/30 Days Discharge Instructions: May shower and wash wound with dial antibacterial soap and water prior to dressing change. Peri-Wound Care: Sween Lotion (Moisturizing lotion) 1 x Per Week/30 Days Discharge Instructions: Apply moisturizing lotion as directed Prim Dressing: Hydrofera Blue Classic Foam, 4x4 in 1 x Per Week/30 Days ary Discharge Instructions: Moisten with saline prior to applying to wound bed Secondary Dressing: Zetuvit Plus 4x8 in 1 x Per Week/30 Days Discharge Instructions: Apply over primary dressing as directed. Secured With: 29M Medipore Public affairs consultant Surgical T 2x10 (in/yd) 1 x Per Week/30 Days ape Discharge Instructions: Secure with tape as directed. Com pression Wrap: ThreePress (3 layer compression wrap) 1 x Per Week/30 Days Discharge Instructions: Apply three layer compression as directed. 1. The patient's right leg is a new wound. Initially a cat scratch and then trauma with some form of cake pan. She is very painful around here although there is not a lot of drainage or erythema very warm extending to her heel. I think she is probably going to require antibiotics although she has a history of C. difficile colitis..... 2. On the  left leg her edema control is very poor she says the Unna boots of falling down repetitively. We continued with the Hydrofera Blue using 3 layer compression bilaterally with Unna boot adhering at the top of the dressing. 3. I am concerned about her history of pseudomembranous colitis however I do not feel I can leave this woman without antibiotics I put her on Keflex 500 4 times daily for 7 days 4. I was also concerned about wrapping her right leg although she is not able to do the dressing and the swelling in the right leg was really quite impressive. Electronic Signature(s) Signed: 10/22/2022 5:06:55 PM By: Linton Ham MD Entered By: Linton Ham on 10/22/2022 16:40:40 -------------------------------------------------------------------------------- SuperBill Details Patient Name: Date of Service: [name], [name] 10/22/2022 Medical Record Number: [id number] Patient Account Number: [id number] Date of Birth/Sex: Treating RN: [birth year] (76 y.o. F) Primary Care Provider: [name] Other Clinician: Referring Provider: Treating Provider/Extender: Pati Gallo in Treatment: 12 Diagnosis Coding ICD-10 Codes Code Description [id number] Non-pressure chronic ulcer of other part of left lower leg with fat layer exposed L97.818 Non-pressure chronic ulcer of other part of right lower leg with other specified severity I83.893 Varicose veins of bilateral lower extremities with other complications Y09.9 Venous insufficiency (chronic) (peripheral) G62.9 Polyneuropathy, unspecified R60.0 Localized edema KATTLEYA, KUHNERT ([id number]) [id number] Page 8 of 8 L03.115 Cellulitis of right lower limb Facility Procedures : CPT4 Code: [id number] Description: 37902 - DEBRIDE WOUND 1ST 20 SQ CM OR < ICD-10 Diagnosis Description L97.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d Modifier: 59 Quantity:  1 Physician Procedures : CPT4 Code Description Modifier [id number] - WC PHYS LEVEL 4 - EST PT 25 ICD-10 Diagnosis Description L97.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d L97.818 Non-pressure chronic ulcer of other part of right  lower leg with other specified severity L03.115 Cellulitis of right lower limb Quantity: 1 : [id number] 97597 - WC PHYS DEBR WO ANESTH 20 SQ CM 59 ICD-10 Diagnosis Description  O13.086 Non-pressure chronic ulcer of other part of left lower leg with fat layer exposed Quantity: 1 Electronic Signature(s) Signed: 10/22/2022 5:06:55 PM By: Linton Ham MD Entered By: Linton Ham on 10/22/2022 16:41:51

## 2022-10-23 NOTE — Progress Notes (Signed)
Amber, GARRO ([id number]) [id number] Page 1 of 10 Visit Report for 10/22/2022 Arrival Information Details Patient Name: Date of Service: [name], [name] 10/22/2022 2:30 PM Medical Record Number: [id number] Patient Account Number: [id number] Date of Birth/Sex: Treating RN: 25-May-1946 (76 y.o. [name], [name] Primary Care Sashay Felling: [name] Other Clinician: Referring Maribelle Hopple: Treating Nolin Grell/Extender: Pati Gallo in Treatment: 12 Visit Information History Since Last Visit Added or deleted any medications: No Patient Arrived: Ambulatory Any new allergies or adverse reactions: No Arrival Time: 14:54 Had a fall or experienced change in No Accompanied By: self activities of daily living that may affect Transfer Assistance: None risk of falls: Patient Requires Transmission-Based Precautions: No Signs or symptoms of abuse/neglect since last visito No Patient Has Alerts: No Hospitalized since last visit: No Implantable device outside of the clinic excluding No cellular tissue based products placed in the center since last visit: Has Compression in Place as Prescribed: Yes Pain Present Now: Yes Electronic Signature(s) Signed: 10/22/2022 5:09:12 PM By: Blanche East RN Entered By: Blanche East on 10/22/2022 14:55:10 -------------------------------------------------------------------------------- Compression Therapy Details Patient Name: Date of Service: DRISANA, SCHWEICKERT 10/22/2022 2:30 PM Medical Record Number: [id number] Patient Account Number: [id number] Date of Birth/Sex: Treating RN: 1946/03/01 (76 y.o. [name] Primary Care [name] Herrman: [name] Other Clinician: Referring Taevyn Hausen: Treating Hakeen Shipes/Extender: Pati Gallo in Treatment: 12 Compression Therapy Performed for Wound Assessment: Wound #2 Left,Posterior Lower Leg Performed By: Clinician Blanche East,  RN Compression Type: Three Layer Post Procedure Diagnosis Same as Pre-procedure Electronic Signature(s) Signed: 10/22/2022 5:09:12 PM By: Blanche East RN Entered By: Blanche East on 10/22/2022 15:23:27 [name] ([id number]) [id number] Page 2 of 10 -------------------------------------------------------------------------------- Compression Therapy Details Patient Name: Date of Service: MARYSOL, WELLNITZ 10/22/2022 2:30 PM Medical Record Number: [id number] Patient Account Number: [id number] Date of Birth/Sex: Treating RN: [birth year] (76 y.o. [name] Primary Care [name]: [name] Other Clinician: Referring Annalisse Minkoff: Treating Maxx Pham/Extender: Pati Gallo in Treatment: 12 Compression Therapy Performed for Wound Assessment: Wound #3 Right,Lateral Ankle Performed By: Clinician Blanche East, RN Compression Type: Three Layer Post Procedure Diagnosis Same as Pre-procedure Electronic Signature(s) Signed: 10/22/2022 4:40:02 PM By: Blanche East RN Entered By: Blanche East on 10/22/2022 16:40:02 -------------------------------------------------------------------------------- Encounter Discharge Information Details Patient Name: Date of Service: Amber, [name] 10/22/2022 2:30 PM Medical Record Number: [id number] Patient Account Number: [id number] Date of Birth/Sex: Treating RN: Mar 07, 1946 (76 y.o. [name] Primary Care Keri Tavella: [name] Other Clinician: Referring Gloria Ricardo: Treating Aarionna Germer/Extender: Pati Gallo in Treatment: 12 Encounter Discharge Information Items Post Procedure Vitals Discharge Condition: Stable Temperature (F): 98.0 Ambulatory Status: Ambulatory Pulse (bpm): 73 Discharge Destination: Home Respiratory Rate (breaths/min): 18 Transportation: Private Auto Blood Pressure (mmHg): 171/97 Accompanied By: self Schedule Follow-up Appointment:  Yes Clinical Summary of Care: Electronic Signature(s) Signed: 10/22/2022 5:09:12 PM By: Blanche East RN Entered By: Blanche East on 10/22/2022 15:25:04 -------------------------------------------------------------------------------- Lower Extremity Assessment Details Patient Name: Date of Service: Amber, [name] 10/22/2022 2:30 PM Medical Record Number: [id number] Patient Account Number: [id number] Date of Birth/Sex: Treating RN: 20-Apr-1946 (76 y.o. [name] Primary Care Toriano Aikey: [name] Other Clinician: Referring Yasseen Salls: Treating Nashaly Dorantes/Extender: Pati Gallo in Treatment: 12 Edema Assessment Assessed: [name]: No] Patrice Paradise: No] [Left: Edema] Patrice Paradise: :] [name], [name] ([id number]) [id number] Page 3 of 10 Left: Right: Point of Measurement: From Medial Instep 52 cm 48.5 cm Ankle Left: Right: Point of Measurement: From Medial Instep 23  cm 23.5 cm Vascular Assessment Pulses: Dorsalis Pedis Palpable: [Left:Yes] [Right:Yes] Electronic Signature(s) Signed: 10/22/2022 5:09:12 PM By: Blanche East RN Entered By: Blanche East on 10/22/2022 14:59:40 -------------------------------------------------------------------------------- Multi Wound Chart Details Patient Name: Date of Service: [name]. 10/22/2022 2:30 PM Medical Record Number: [id number] Patient Account Number: [id number] Date of Birth/Sex: Treating RN: Feb 12, 1946 (76 y.o. F) Primary Care [name]: [name] Other Clinician: Referring Cherrill Scrima: Treating Keiron Iodice/Extender: Pati Gallo in Treatment: 12 Vital Signs Height(in): 9 Pulse(bpm): 68 Weight(lbs): 200 Blood Pressure(mmHg): 171/97 Body Mass Index(BMI): 35.4 Temperature(F): 98.0 Respiratory Rate(breaths/min): 18 [2:Photos:] [N/A:N/A] Left, Posterior Lower Leg Right, Lateral Ankle N/A Wound Location: Blister Contusion/Bruise  N/A Wounding Event: Venous Leg Ulcer Venous Leg Ulcer N/A Primary Etiology: Cataracts, Asthma, Hypertension, Cataracts, Asthma, Hypertension, N/A Comorbid History: Neuropathy, Received Chemotherapy, Neuropathy, Received Chemotherapy, Received Radiation Received Radiation 07/06/2022 10/19/2022 N/A Date Acquired: 12 0 N/A Weeks of Treatment: Open Open N/A Wound Status: No No N/A Wound Recurrence: 6x3x0.1 1.7x2x0.1 N/A Measurements L x W x D (cm) 14.137 2.67 N/A A (cm) : rea 1.414 0.267 N/A Volume (cm) : -105.70% N/A N/A % Reduction in Area: -105.80% N/A N/A % Reduction in Volume: Full Thickness Without Exposed Full Thickness Without Exposed N/A Classification: Support Structures Support Structures Medium Medium N/A Exudate Amount: Serosanguineous Purulent N/A Exudate Type: red, brown yellow, brown, green N/A Exudate Color: No Yes N/A Foul Odor A Cleansing: fter N/A No N/A Odor Anticipated Due to Product Use: JESYCA, [name] ([id number]) [id number] Page 4 of 10 Flat and Intact N/A N/A Wound Margin: Medium (34-66%) Small (1-33%) N/A Granulation Amount: Red Pale, Friable N/A Granulation Quality: Medium (34-66%) Large (67-100%) N/A Necrotic Amount: Eschar, Adherent Slough Eschar, Adherent Slough N/A Necrotic Tissue: Fat Layer (Subcutaneous Tissue): Yes N/A N/A Exposed Structures: Fascia: No Tendon: No Muscle: No Joint: No Bone: No Small (1-33%) N/A N/A Epithelialization: Debridement - Selective/Open Wound N/A N/A Debridement: Pre-procedure Verification/Time Out 15:19 N/A N/A Taken: Lidocaine 5% topical ointment N/A N/A Pain Control: Slough N/A N/A Tissue Debrided: Non-Viable Tissue N/A N/A Level: 18 N/A N/A Debridement A (sq cm): rea Curette N/A N/A Instrument: Minimum N/A N/A Bleeding: Pressure N/A N/A Hemostasis A chieved: Procedure was tolerated well N/A N/A Debridement Treatment Response: 6x3x0.1 N/A N/A Post  Debridement Measurements L x W x D (cm) 1.414 N/A N/A Post Debridement Volume: (cm) Excoriation: No Excoriation: Yes N/A Periwound Skin Texture: Maceration: No Maceration: Yes N/A Periwound Skin Moisture: No Abnormalities Noted Erythema: Yes N/A Periwound Skin Color: Yes N/A N/A Tenderness on Palpation: Compression Therapy N/A N/A Procedures Performed: Debridement Treatment Notes Wound #2 (Lower Leg) Wound Laterality: Left, Posterior Cleanser Soap and Water Discharge Instruction: May shower and wash wound with dial antibacterial soap and water prior to dressing change. Peri-Wound Care Sween Lotion (Moisturizing lotion) Discharge Instruction: Apply moisturizing lotion as directed Topical Keystone Discharge Instruction: Apply as directed to wound Primary Dressing Promogran Prisma Matrix, 4.34 (sq in) (silver collagen) Discharge Instruction: Moisten collagen with saline or hydrogel Secondary Dressing Zetuvit Plus 4x8 in Discharge Instruction: Apply over primary dressing as directed. Secured With [employer] Surgical T 2x10 (in/yd) ape Discharge Instruction: Secure with tape as directed. Compression Wrap Unnaboot w/Calamine, 4x10 (in/yd) Discharge Instruction: Apply Unnaboot as directed. Compression Stockings Add-Ons Wound #3 (Ankle) Wound Laterality: Right, Lateral Cleanser Peri-Wound Care Topical Primary Dressing Secondary Dressing IRETHA, KIRLEY ([id number]) [id number] Page 5 of 10 Secured With Compression Wrap Compression Stockings Environmental education officer) Signed: 10/22/2022 5:06:55 PM By: Dellia Nims,  Legrand Como MD Entered By: Linton Ham on 10/22/2022 15:57:36 -------------------------------------------------------------------------------- Multi-Disciplinary Care Plan Details Patient Name: Date of Service: MALGORZATA, ALBERT 10/22/2022 2:30 PM Medical Record Number: [id number] Patient Account Number:  [id number] Date of Birth/Sex: Treating RN: [birth year] (76 y.o. [name] Primary Care Legend Tumminello: [name] Other Clinician: Referring Donyea Gafford: Treating Chasen Mendell/Extender: Pati Gallo in Treatment: 12 Active Inactive Wound/Skin Impairment Nursing Diagnoses: Impaired tissue integrity Goals: Ulcer/skin breakdown will have a volume reduction of 30% by week 4 Date Initiated: 08/03/2022 Date Inactivated: 09/10/2022 Target Resolution Date: 08/31/2022 Unmet Reason: uncontrolled edema, Goal Status: Unmet acute infection Ulcer/skin breakdown will have a volume reduction of 50% by week 8 Date Initiated: 09/10/2022 Target Resolution Date: 12/07/2022 Goal Status: Active Interventions: Assess ulceration(s) every visit Treatment Activities: Skin care regimen initiated : 08/03/2022 Notes: Keystone ordered. 10/30 RX Levo started. 09/10/22 Electronic Signature(s) Signed: 10/22/2022 5:09:12 PM By: Blanche East RN Entered By: Blanche East on 10/22/2022 15:16:15 -------------------------------------------------------------------------------- Pain Assessment Details Patient Name: Date of Service: ADRENA, NAKAMURA 10/22/2022 2:30 PM Medical Record Number: [id number] Patient Account Number: [id number] Date of Birth/Sex: Treating RN: 11/02/1946 (76 y.o. [name] Primary Care Destinae Neubecker: [name] Other Clinician: Referring Alicen Donalson: Treating Jaydon Soroka/Extender: Zanasia, Hickson, [name] ([id number]) [id number] Page 6 of 10 Weeks in Treatment: 12 Active Problems Location of Pain Severity and Description of Pain Patient Has Paino Yes Site Locations Rate the pain. Current Pain Level: 8 Character of Pain Describe the Pain: Burning, Sharp, Stabbing Pain Management and Medication Current Pain Management: Electronic Signature(s) Signed: 10/22/2022 5:09:12 PM By: Blanche East RN Entered By:  Blanche East on 10/22/2022 14:55:38 -------------------------------------------------------------------------------- Patient/Caregiver Education Details Patient Name: Date of Service: [name] 12/18/2023andnbsp2:30 PM Medical Record Number: [id number] Patient Account Number: [id number] Date of Birth/Gender: Treating RN: [birth year] (76 y.o. [name] Primary Care Physician: [name] Other Clinician: Referring Physician: Treating Physician/Extender: Pati Gallo in Treatment: 12 Education Assessment Education Provided To: Patient Education Topics Provided Wound/Skin Impairment: Methods: Explain/Verbal Responses: Reinforcements needed, State content correctly Electronic Signature(s) Signed: 10/22/2022 5:09:12 PM By: Blanche East RN Entered By: Blanche East on 10/22/2022 15:19:10 [name] ([id number]) [id number] Page 7 of 10 -------------------------------------------------------------------------------- Wound Assessment Details Patient Name: Date of Service: SUHEILY, BIRKS 10/22/2022 2:30 PM Medical Record Number: [id number] Patient Account Number: [id number] Date of Birth/Sex: Treating RN: January 15, 1946 (76 y.o. [name], [place] Primary Care Fox Salminen: [name] Other Clinician: Referring Makyia Erxleben: Treating Burnham Trost/Extender: Pati Gallo in Treatment: 12 Wound Status Wound Number: 2 Primary Venous Leg Ulcer Etiology: Wound Location: Left, Posterior Lower Leg Wound Open Wounding Event: Blister Status: Date Acquired: 07/06/2022 Comorbid Cataracts, Asthma, Hypertension, Neuropathy, Received Weeks Of Treatment: 12 History: Chemotherapy, Received Radiation Clustered Wound: No Photos Wound Measurements Length: (cm) 6 Width: (cm) 3 Depth: (cm) 0.1 Area: (cm) 14.137 Volume: (cm) 1.414 % Reduction in Area: -105.7% % Reduction in Volume:  -105.8% Epithelialization: Small (1-33%) Tunneling: No Undermining: No Wound Description Classification: Full Thickness Without Exposed Suppor Wound Margin: Flat and Intact Exudate Amount: Medium Exudate Type: Serosanguineous Exudate Color: red, brown t Structures Foul Odor After Cleansing: No Slough/Fibrino Yes Wound Bed Granulation Amount: Medium (34-66%) Exposed Structure Granulation Quality: Red Fascia Exposed: No Necrotic Amount: Medium (34-66%) Fat Layer (Subcutaneous Tissue) Exposed: Yes Necrotic Quality: Eschar, Adherent Slough Tendon Exposed: No Muscle Exposed: No Joint Exposed: No Bone Exposed: No Periwound Skin Texture Texture Color No Abnormalities Noted: No No Abnormalities Noted: Yes Excoriation: No  Temperature / Pain Tenderness on Palpation: Yes Moisture No Abnormalities Noted: No Maceration: No Treatment Notes Wound #2 (Lower Leg) Wound Laterality: Left, Posterior Cleanser SAHIBA, GRANHOLM ([id number]) [id number] Page 8 of 10 Soap and Water Discharge Instruction: May shower and wash wound with dial antibacterial soap and water prior to dressing change. Peri-Wound Care Sween Lotion (Moisturizing lotion) Discharge Instruction: Apply moisturizing lotion as directed Topical Keystone Discharge Instruction: Apply as directed to wound Primary Dressing Hydrofera Blue Classic Foam, 4x4 in Discharge Instruction: Moisten with saline prior to applying to wound bed Secondary Dressing Zetuvit Plus 4x8 in Discharge Instruction: Apply over primary dressing as directed. Secured With [employer] Surgical T 2x10 (in/yd) ape Discharge Instruction: Secure with tape as directed. Compression Wrap ThreePress (3 layer compression wrap) Discharge Instruction: Apply three layer compression as directed. Compression Stockings Add-Ons Electronic Signature(s) Signed: 10/22/2022 5:09:12 PM By: Blanche East RN Entered By: Blanche East  on 10/22/2022 15:06:33 -------------------------------------------------------------------------------- Wound Assessment Details Patient Name: Date of Service: LUCINA, BETTY 10/22/2022 2:30 PM Medical Record Number: [id number] Patient Account Number: [id number] Date of Birth/Sex: Treating RN: 1946/06/28 (76 y.o. [name] Primary Care Ambrose Wile: [name] Other Clinician: Referring Lynzi Meulemans: Treating Eddye Broxterman/Extender: Pati Gallo in Treatment: 12 Wound Status Wound Number: 3 Primary Venous Leg Ulcer Etiology: Wound Location: Right, Lateral Ankle Wound Open Wounding Event: Contusion/Bruise Status: Date Acquired: 10/19/2022 Comorbid Cataracts, Asthma, Hypertension, Neuropathy, Received Weeks Of Treatment: 0 History: Chemotherapy, Received Radiation Clustered Wound: No Photos Wound Measurements EYVETTE, CORDON ([id number]) Length: (cm) 1.7 Width: (cm) 2 Depth: (cm) 0.1 Area: (cm) 2.67 Volume: (cm) 0.267 [id number] Page 9 of 10 % Reduction in Area: % Reduction in Volume: Tunneling: No Undermining: No Wound Description Classification: Full Thickness Without Exposed Suppor Exudate Amount: Medium Exudate Type: Purulent Exudate Color: yellow, brown, green t Structures Foul Odor After Cleansing: Yes Due to Product Use: No Slough/Fibrino Yes Wound Bed Granulation Amount: Small (1-33%) Granulation Quality: Pale, Friable Necrotic Amount: Large (67-100%) Necrotic Quality: Eschar, Adherent Slough Periwound Skin Texture Texture Color No Abnormalities Noted: No No Abnormalities Noted: No Excoriation: Yes Erythema: Yes Moisture No Abnormalities Noted: No Maceration: Yes Treatment Notes Wound #3 (Ankle) Wound Laterality: Right, Lateral Cleanser Soap and Water Discharge Instruction: May shower and wash wound with dial antibacterial soap and water prior to dressing change. Peri-Wound Care Sween Lotion  (Moisturizing lotion) Discharge Instruction: Apply moisturizing lotion as directed Topical Primary Dressing Hydrofera Blue Classic Foam, 4x4 in Discharge Instruction: Moisten with saline prior to applying to wound bed Secondary Dressing Zetuvit Plus 4x8 in Discharge Instruction: Apply over primary dressing as directed. Secured With [employer] Surgical T 2x10 (in/yd) ape Discharge Instruction: Secure with tape as directed. Compression Wrap ThreePress (3 layer compression wrap) Discharge Instruction: Apply three layer compression as directed. Compression Stockings Add-Ons Electronic Signature(s) Signed: 10/22/2022 5:09:12 PM By: Blanche East RN Entered By: Blanche East on 10/22/2022 15:07:04 Vitals Details -------------------------------------------------------------------------------- [name] ([id number]) [id number] Page 10 of 10 Patient Name: Date of Service: ADALEE, KATHAN 10/22/2022 2:30 PM Medical Record Number: [id number] Patient Account Number: [id number] Date of Birth/Sex: Treating RN: 05/08/1946 (76 y.o. [name], [name] Primary Care Nalani Andreen: [name] Other Clinician: Referring Antanette Richwine: Treating Khayri Kargbo/Extender: Pati Gallo in Treatment: 12 Vital Signs Time Taken: 14:55 Temperature (F): 98.0 Height (in): 63 Pulse (bpm): 73 Weight (lbs): 200 Respiratory Rate (breaths/min): 18 Body Mass Index (BMI): 35.4 Blood Pressure (mmHg): 171/97 Reference Range: 80 - 120 mg /  dl Electronic Signature(s) Signed: 10/22/2022 5:09:12 PM By: Blanche East RN Entered By: Blanche East on 10/22/2022 14:55:28

## 2022-10-24 ENCOUNTER — Encounter: Payer: Self-pay | Admitting: Family Medicine

## 2022-10-24 ENCOUNTER — Other Ambulatory Visit (INDEPENDENT_AMBULATORY_CARE_PROVIDER_SITE_OTHER): Payer: Medicare Other

## 2022-10-24 ENCOUNTER — Ambulatory Visit (INDEPENDENT_AMBULATORY_CARE_PROVIDER_SITE_OTHER): Payer: Medicare Other | Admitting: Family Medicine

## 2022-10-24 VITALS — BP 136/70 | HR 78 | Temp 97.2°F | Ht 63.0 in | Wt 206.4 lb

## 2022-10-24 DIAGNOSIS — L97929 Non-pressure chronic ulcer of unspecified part of left lower leg with unspecified severity: Secondary | ICD-10-CM

## 2022-10-24 DIAGNOSIS — I872 Venous insufficiency (chronic) (peripheral): Secondary | ICD-10-CM

## 2022-10-24 DIAGNOSIS — I83891 Varicose veins of right lower extremities with other complications: Secondary | ICD-10-CM | POA: Diagnosis not present

## 2022-10-24 DIAGNOSIS — M79605 Pain in left leg: Secondary | ICD-10-CM

## 2022-10-24 DIAGNOSIS — E559 Vitamin D deficiency, unspecified: Secondary | ICD-10-CM | POA: Diagnosis not present

## 2022-10-24 DIAGNOSIS — Z13220 Encounter for screening for lipoid disorders: Secondary | ICD-10-CM

## 2022-10-24 DIAGNOSIS — E611 Iron deficiency: Secondary | ICD-10-CM

## 2022-10-24 DIAGNOSIS — K529 Noninfective gastroenteritis and colitis, unspecified: Secondary | ICD-10-CM | POA: Diagnosis not present

## 2022-10-24 DIAGNOSIS — M79604 Pain in right leg: Secondary | ICD-10-CM

## 2022-10-24 DIAGNOSIS — Z131 Encounter for screening for diabetes mellitus: Secondary | ICD-10-CM

## 2022-10-24 DIAGNOSIS — I83892 Varicose veins of left lower extremities with other complications: Secondary | ICD-10-CM

## 2022-10-24 DIAGNOSIS — I83019 Varicose veins of right lower extremity with ulcer of unspecified site: Secondary | ICD-10-CM

## 2022-10-24 DIAGNOSIS — I83029 Varicose veins of left lower extremity with ulcer of unspecified site: Secondary | ICD-10-CM

## 2022-10-24 DIAGNOSIS — I871 Compression of vein: Secondary | ICD-10-CM

## 2022-10-24 DIAGNOSIS — R600 Localized edema: Secondary | ICD-10-CM

## 2022-10-24 DIAGNOSIS — L97919 Non-pressure chronic ulcer of unspecified part of right lower leg with unspecified severity: Secondary | ICD-10-CM | POA: Diagnosis not present

## 2022-10-24 DIAGNOSIS — S99921A Unspecified injury of right foot, initial encounter: Secondary | ICD-10-CM | POA: Diagnosis not present

## 2022-10-24 LAB — CBC WITH DIFFERENTIAL/PLATELET
Basophils Absolute: 0 10*3/uL (ref 0.0–0.1)
Basophils Relative: 0.3 % (ref 0.0–3.0)
Eosinophils Absolute: 0.2 10*3/uL (ref 0.0–0.7)
Eosinophils Relative: 2.7 % (ref 0.0–5.0)
HCT: 36.6 % (ref 36.0–46.0)
Hemoglobin: 12.3 g/dL (ref 12.0–15.0)
Lymphocytes Relative: 26.4 % (ref 12.0–46.0)
Lymphs Abs: 1.9 10*3/uL (ref 0.7–4.0)
MCHC: 33.7 g/dL (ref 30.0–36.0)
MCV: 89.6 fl (ref 78.0–100.0)
Monocytes Absolute: 0.5 10*3/uL (ref 0.1–1.0)
Monocytes Relative: 6.3 % (ref 3.0–12.0)
Neutro Abs: 4.6 10*3/uL (ref 1.4–7.7)
Neutrophils Relative %: 64.3 % (ref 43.0–77.0)
Platelets: 249 10*3/uL (ref 150.0–400.0)
RBC: 4.08 Mil/uL (ref 3.87–5.11)
RDW: 14 % (ref 11.5–15.5)
WBC: 7.2 10*3/uL (ref 4.0–10.5)

## 2022-10-24 LAB — COMPREHENSIVE METABOLIC PANEL
ALT: 16 U/L (ref 0–35)
AST: 13 U/L (ref 0–37)
Albumin: 3.9 g/dL (ref 3.5–5.2)
Alkaline Phosphatase: 59 U/L (ref 39–117)
BUN: 14 mg/dL (ref 6–23)
CO2: 32 mEq/L (ref 19–32)
Calcium: 9.4 mg/dL (ref 8.4–10.5)
Chloride: 104 mEq/L (ref 96–112)
Creatinine, Ser: 0.77 mg/dL (ref 0.40–1.20)
GFR: 75 mL/min (ref >60.00)
Glucose, Bld: 86 mg/dL (ref 70–99)
Potassium: 3.8 mEq/L (ref 3.5–5.1)
Sodium: 142 mEq/L (ref 135–145)
Total Bilirubin: 0.5 mg/dL (ref 0.2–1.2)
Total Protein: 6.8 g/dL (ref 6.0–8.3)

## 2022-10-24 LAB — LIPID PANEL
Cholesterol: 149 mg/dL (ref 0–200)
HDL: 57 mg/dL (ref >39.00)
LDL Cholesterol: 79 mg/dL (ref 0–99)
NonHDL: 91.68
Total CHOL/HDL Ratio: 3
Triglycerides: 65 mg/dL (ref 0.0–149.0)
VLDL: 13 mg/dL (ref 0.0–40.0)

## 2022-10-24 LAB — IBC PANEL
Iron: 55 ug/dL (ref 42–145)
Saturation Ratios: 18.9 % — ABNORMAL LOW (ref 20.0–50.0)
TIBC: 291.2 ug/dL (ref 250.0–450.0)
Transferrin: 208 mg/dL — ABNORMAL LOW (ref 212.0–360.0)

## 2022-10-24 LAB — FERRITIN: Ferritin: 64.3 ng/mL (ref 10.0–291.0)

## 2022-10-24 LAB — VITAMIN D 25 HYDROXY (VIT D DEFICIENCY, FRACTURES): VITD: 54.06 ng/mL (ref 30.00–100.00)

## 2022-10-24 MED ORDER — SACCHAROMYCES BOULARDII 250 MG PO CAPS: 250.0000 mg | ORAL_CAPSULE | Freq: Two times a day (BID) | ORAL | Status: AC

## 2022-10-24 NOTE — Progress Notes (Signed)
Patient ID: KAGAN HIETPAS, female    DOB: Dec 04, 1945, 76 y.o.   MRN: [id number]  This visit was conducted in person.  BP 136/70   Pulse 78   Temp (!) 97.2 F (36.2 C) (Temporal)   Ht '5\' 3"'$  (1.6 m)   Wt 206 lb 6.4 oz (93.6 kg)   SpO2 99%   BMI 36.56 kg/m    CC: check bug bite Subjective:   HPI: SHIVANGI LUTZ is a 76 y.o. female presenting on 10/24/2022 for Insect Bite (Here for bug bite f/u on R foot. Followed by wound clinic. )   Infected LLE venous stasis ulcer, culture grew pseudomonas, treated with cipro antibiotic course. Established with and now followed by wound clinic.   3-4 wks ago had R dorsal foot swelling after possible bug bite sustained while outdoors. Doesn't remember bug bite. Developed new R lateral ankle wound after one of her cats scratched her ankle. [name] also dropped cast iron pan onto R foot (5-6 days ago).   Last saw wound clinic 10/22/2022 - started on antibiotics (keflex '500mg'$  QID), planning debridement of wound to R leg next week.   Notes R dorsal foot swelling since dropping pan, but no increased swelling of leg. Ongoing pain to ankle.  No fevers/chills.  No pain with ambulation.   She is now taking specialty compounded antibiotic topical powder through [facility] in [place].   [name] been using epsom salt soaks.   Notes ongoing diarrhea. Was recommended to start probiotic - she started taking florastor probiotic with improvement.   Just adopted out 10 of her cats.      Relevant past medical, surgical, family and social history reviewed and updated as indicated. Interim medical history since our last visit reviewed. Allergies and medications reviewed and updated. Outpatient Medications Prior to Visit  Medication Sig Dispense Refill   acetaminophen (TYLENOL) 650 MG CR tablet Take 2 tablets (1,300 mg total) by mouth 2 (two) times daily as needed for pain.     albuterol (VENTOLIN HFA) 108 (90 Base) MCG/ACT inhaler INHALE 2  PUFFS BY MOUTH EVERY 6 HOURS AS NEEDED FOR WHEEZING AND SHORTNESS OF BREATH 8.5 each 0   aspirin EC 81 MG tablet Take 81 mg by mouth at bedtime. Melanoma prevention     cephALEXin (KEFLEX) 500 MG capsule Take 500 mg by mouth 4 (four) times daily.     furosemide (LASIX) 40 MG tablet TAKE 1 TABLET (40 MG TOTAL) BY MOUTH DAILY. TAKE SECOND DOSE IF NEEDED FOR LEG SWELLING/WEIGHT GAIN 60 tablet 2   loratadine (CLARITIN) 10 MG tablet Take 10 mg by mouth daily.     losartan (COZAAR) 50 MG tablet TAKE 1 TABLET BY MOUTH EVERY DAY 90 tablet 1   montelukast (SINGULAIR) 10 MG tablet TAKE 1 TABLET BY MOUTH EVERY DAY 90 tablet 1   Multiple Vitamin (MULTIVITAMIN WITH MINERALS) TABS tablet Take 1 tablet by mouth daily.     Polyvinyl Alcohol-Povidone (TEARS PLUS OP) Place 1 drop into both eyes daily as needed (dry eyes/ redness/ burning).      potassium chloride SA (KLOR-CON M20) 20 MEQ tablet TAKE 1 TABLET BY MOUTH TWICE A DAY 180 tablet 0   PRESCRIPTION MEDICATION CPAP     triamcinolone (NASACORT) 55 MCG/ACT AERO nasal inhaler Place 2 sprays into the nose daily. In each nostril     Vitamin D, Ergocalciferol, (DRISDOL) 1.25 MG (50000 UNIT) CAPS capsule TAKE 1 CAPSULE BY MOUTH ONCE A WEEK 12 capsule  1   Probiotic Product (PROBIOTIC DAILY PO) Take 1 tablet by mouth daily.      No facility-administered medications prior to visit.     Per HPI unless specifically indicated in ROS section below Review of Systems  Objective:  BP 136/70   Pulse 78   Temp (!) 97.2 F (36.2 C) (Temporal)   Ht '5\' 3"'$  (1.6 m)   Wt 206 lb 6.4 oz (93.6 kg)   SpO2 99%   BMI 36.56 kg/m   Wt Readings from Last 3 Encounters:  10/24/22 206 lb 6.4 oz (93.6 kg)  08/15/22 198 lb (89.8 kg)  07/24/22 201 lb 2 oz (91.2 kg)      Physical Exam Vitals and nursing note reviewed.  Constitutional:      Appearance: Normal appearance. She is not ill-appearing.  Cardiovascular:     Rate and Rhythm: Normal rate and regular rhythm.      Pulses: Normal pulses.     Heart sounds: Normal heart sounds. No murmur heard. Pulmonary:     Effort: Pulmonary effort is normal. No respiratory distress.     Breath sounds: Normal breath sounds. No wheezing, rhonchi or rales.  Musculoskeletal:        General: Swelling and tenderness present.     Right lower leg: Edema present.     Left lower leg: Edema present.     Comments:  Legs stay tender throughout Bilateral legs in compression wraps down to dorsal feet.  No pain with palpation to bilateral dorsal feet at MT shafts   Skin:    General: Skin is warm and dry.  Neurological:     Mental Status: She is alert.       Lab Results  Component Value Date   WBC 7.2 10/24/2022   HGB 12.3 10/24/2022   HCT 36.6 10/24/2022   MCV 89.6 10/24/2022   PLT 249.0 10/24/2022   Assessment & Plan:   Problem List Items Addressed This Visit     Chronic venous insufficiency    May-Thurner syndrome s/p stenting to LLE, due for [facility]VS f/u - will call to schedule.       Leg pain, bilateral   Venous stasis ulcer of left lower leg with edema of left lower leg (HCC)    Continues f/u with wound clinic, planned upcoming debridement      May-Thurner syndrome    Will f/u with [facility] as due.       Venous stasis ulcer of right lower leg with edema of right lower leg (HCC)   Chronic diarrhea    Ongoing. She completed negative GI pathogen panel and C diff testing earlier this year. She has started florastor probiotic (with saccharomyces) BID with some benefit noted, but frequent loose stools persist. She is also now currently on repeat oral antibiotic keflex for recurrent cellulitis/infected wound. Will offer GI referral next visit.       Right foot injury, initial encounter - Primary    Multiple injuries to R foot including dropping cast iron pan onto dorsal foot. No significant pain to palpation of MT shaft, no significant pain with ambulation - not consistent with fracture.         Meds ordered this  encounter  Medications   saccharomyces boulardii (FLORASTOR) 250 MG capsule    Sig: Take 1 capsule (250 mg total) by mouth 2 (two) times daily.   No orders of the defined types were placed in this encounter.    Patient Instructions  Call to schedule  appointment with vein doctor for follow up.  Keep scheduled follow up with wound clinic.  Get RSV shot.   Follow up plan: Return if symptoms worsen or fail to improve.  [name], MD

## 2022-10-24 NOTE — Patient Instructions (Addendum)
Call to schedule appointment with vein doctor for follow up.  Keep scheduled follow up with wound clinic.  Get RSV shot.

## 2022-10-26 DIAGNOSIS — S99921A Unspecified injury of right foot, initial encounter: Secondary | ICD-10-CM | POA: Insufficient documentation

## 2022-10-26 NOTE — Assessment & Plan Note (Signed)
Multiple injuries to R foot including dropping cast iron pan onto dorsal foot. No significant pain to palpation of MT shaft, no significant pain with ambulation - not consistent with fracture.

## 2022-10-26 NOTE — Assessment & Plan Note (Signed)
Ongoing. She completed negative GI pathogen panel and C diff testing earlier this year. She has started florastor probiotic (with saccharomyces) BID with some benefit noted, but frequent loose stools persist. She is also now currently on repeat oral antibiotic keflex for recurrent cellulitis/infected wound. Will offer GI referral next visit.

## 2022-10-26 NOTE — Assessment & Plan Note (Signed)
May-Thurner syndrome s/p stenting to LLE, due for [facility] f/u - will call to schedule.

## 2022-10-26 NOTE — Assessment & Plan Note (Signed)
Continues f/u with wound clinic, planned upcoming debridement

## 2022-10-26 NOTE — Assessment & Plan Note (Signed)
Will f/u with [facility] as due.

## 2022-10-29 ENCOUNTER — Other Ambulatory Visit: Payer: Self-pay | Admitting: Family Medicine

## 2022-10-30 ENCOUNTER — Encounter (HOSPITAL_BASED_OUTPATIENT_CLINIC_OR_DEPARTMENT_OTHER): Payer: Medicare Other | Admitting: General Surgery

## 2022-10-30 DIAGNOSIS — G4733 Obstructive sleep apnea (adult) (pediatric): Secondary | ICD-10-CM | POA: Diagnosis not present

## 2022-10-30 DIAGNOSIS — L97312 Non-pressure chronic ulcer of right ankle with fat layer exposed: Secondary | ICD-10-CM | POA: Diagnosis not present

## 2022-10-30 DIAGNOSIS — Q969 Turner's syndrome, unspecified: Secondary | ICD-10-CM | POA: Diagnosis not present

## 2022-10-30 DIAGNOSIS — G629 Polyneuropathy, unspecified: Secondary | ICD-10-CM | POA: Diagnosis not present

## 2022-10-30 DIAGNOSIS — L97818 Non-pressure chronic ulcer of other part of right lower leg with other specified severity: Secondary | ICD-10-CM | POA: Diagnosis not present

## 2022-10-30 DIAGNOSIS — L97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d: Secondary | ICD-10-CM | POA: Diagnosis not present

## 2022-10-30 DIAGNOSIS — I872 Venous insufficiency (chronic) (peripheral): Secondary | ICD-10-CM | POA: Diagnosis not present

## 2022-10-30 DIAGNOSIS — L97222 Non-pressure chronic ulcer of left calf with fat layer exposed: Secondary | ICD-10-CM | POA: Diagnosis not present

## 2022-10-30 DIAGNOSIS — L03115 Cellulitis of right lower limb: Secondary | ICD-10-CM | POA: Diagnosis not present

## 2022-10-30 NOTE — Progress Notes (Signed)
Amber, [name] ([id number]) [id number] Page 1 of 9 Visit Report for 10/30/2022 Arrival Information Details Patient Name: Date of Service: [name], [name] 10/30/2022 2:30 PM Medical Record Number: [id number] Patient Account Number: [id number] Date of Birth/Sex: Treating RN: 09/17/1946 (76 y.o. F) Primary Care Margaretta Chittum: [name] Other Clinician: Referring Josha Weekley: Treating Lenix Kidd/Extender: [name] Session in Treatment: 13 Visit Information History Since Last Visit All ordered tests Amber consults were completed: No Patient Arrived: Ambulatory Added or deleted any medications: No Arrival Time: 14:58 Any new allergies or adverse reactions: No Accompanied By: self Had a fall or experienced change in No Transfer Assistance: None activities of daily living that may affect Patient Identification Verified: Yes risk of falls: Secondary Verification Process Completed: Yes Signs or symptoms of abuse/neglect since last visito No Patient Requires Transmission-Based Precautions: No Hospitalized since last visit: No Patient Has Alerts: No Implantable device outside of the clinic excluding No cellular tissue based products placed in the center since last visit: Pain Present Now: No Electronic Signature(s) Signed: 10/30/2022 3:21:29 PM By: Worthy Rancher Entered By: Worthy Rancher on 10/30/2022 14:58:52 -------------------------------------------------------------------------------- Compression Therapy Details Patient Name: Date of Service: [name], [name] 10/30/2022 2:30 PM Medical Record Number: [id number] Patient Account Number: [id number] Date of Birth/Sex: Treating RN: [birth year] (76 y.o. [name] Primary Care Deavion Dobbs: [name] Other Clinician: Referring Gregor Dershem: Treating Tallia [name]/Extender: [name] Session in Treatment: 13 Compression Therapy Performed for Wound Assessment: Wound #2  Left,Posterior Lower Leg Performed By: Clinician Dellie Catholic, RN Compression Type: Double Layer Post Procedure Diagnosis Same as Pre-procedure Notes with Calamine Electronic Signature(s) Signed: 10/30/2022 4:38:14 PM By: Dellie Catholic RN Entered By: Dellie Catholic on 10/30/2022 15:34:07 [name] ([id number]) [id number] Page 2 of 9 -------------------------------------------------------------------------------- Compression Therapy Details Patient Name: Date of Service: [name], [name] 10/30/2022 2:30 PM Medical Record Number: [id number] Patient Account Number: [id number] Date of Birth/Sex: Treating RN: January 08, 1946 (76 y.o. [name] Primary Care Aylee Littrell: [name] Other Clinician: Referring Loralei Radcliffe: Treating Hilton Saephan/Extender: [name] Session in Treatment: 13 Compression Therapy Performed for Wound Assessment: Wound #3 Right,Lateral Ankle Performed By: Clinician Dellie Catholic, RN Compression Type: Double Layer Post Procedure Diagnosis Same as Pre-procedure Notes with Calamine Electronic Signature(s) Signed: 10/30/2022 4:38:14 PM By: Dellie Catholic RN Entered By: Dellie Catholic on 10/30/2022 15:34:07 -------------------------------------------------------------------------------- Encounter Discharge Information Details Patient Name: Date of Service: Amber, [name] 10/30/2022 2:30 PM Medical Record Number: [id number] Patient Account Number: [id number] Date of Birth/Sex: Treating RN: [birth year] (76 y.o. [name] Primary Care Alto Gandolfo: [name] Other Clinician: Referring Monicia Tse: Treating Aubery Date/Extender: [name] Session in Treatment: 13 Encounter Discharge Information Items Post Procedure Vitals Discharge Condition: Stable Temperature (F): 98.4 Ambulatory Status: Cane Pulse (bpm): 72 Discharge Destination: Home Respiratory Rate  (breaths/min): 20 Transportation: Private Auto Blood Pressure (mmHg): 178/91 Accompanied By: self Schedule Follow-up Appointment: Yes Clinical Summary of Care: Patient Declined Electronic Signature(s) Signed: 10/30/2022 4:38:14 PM By: Dellie Catholic RN Entered By: Dellie Catholic on 10/30/2022 16:37:42 -------------------------------------------------------------------------------- Lower Extremity Assessment Details Patient Name: Date of Service: [name], [name] 10/30/2022 2:30 PM Medical Record Number: [id number] Patient Account Number: [id number] Date of Birth/Sex: Treating RN: November 05, 1946 (76 y.o. [name] Primary Care Derriana Oser: [name] Other Clinician: Referring Areeb Corron: Treating Slevin Gunby/Extender: Pixie Casino Joshua, [name] ([id number]) [id number] Page 3 of 9 Weeks in Treatment: 13 Edema Assessment Assessed: [Left: No] [Right: No] [Left: Edema] [Right: :] Calf Left: Right: Point of Measurement: From Medial  Instep 52 cm 48.5 cm Ankle Left: Right: Point of Measurement: From Medial Instep 23 cm 23.5 cm Vascular Assessment Pulses: Dorsalis Pedis Palpable: [Left:Yes] [Right:Yes] Electronic Signature(s) Signed: 10/30/2022 4:38:14 PM By: Dellie Catholic RN Entered By: Dellie Catholic on 10/30/2022 15:14:27 -------------------------------------------------------------------------------- Multi Wound Chart Details Patient Name: Date of Service: [name] 10/30/2022 2:30 PM Medical Record Number: [id number] Patient Account Number: [id number] Date of Birth/Sex: Treating RN: [birth year] (76 y.o. F) Primary Care Mida Cory: [name] Other Clinician: Referring Damary Doland: Treating Lida Berkery/Extender: [name] Session in Treatment: 13 Vital Signs Height(in): 24 Pulse(bpm): 53 Weight(lbs): 200 Blood Pressure(mmHg): 178/91 Body Mass Index(BMI): 35.4 Temperature(F):  98.4 Respiratory Rate(breaths/min): 20 [2:Photos:] [N/A:N/A] Left, Posterior Lower Leg Right, Lateral Ankle N/A Wound Location: Blister Contusion/Bruise N/A Wounding Event: Venous Leg Ulcer Venous Leg Ulcer N/A Primary Etiology: Cataracts, Asthma, Hypertension, Cataracts, Asthma, Hypertension, N/A Comorbid History: Neuropathy, Received Chemotherapy, Neuropathy, Received Chemotherapy, Received Radiation Received Radiation 07/06/2022 10/19/2022 N/A Date Acquired: 13 1 N/A Weeks of Treatment: Open Open N/A Wound Status: No No N/A Wound Recurrence: 5.9x4.8x0.1 1.5x1.6x0.1 N/A Measurements L x W x D (cm) 22.242 1.885 N/A A (cm) : rea 2.224 0.188 N/A Volume (cm) : -223.70% 29.40% N/A % Reduction in Area: AUBRIEE, SZETO ([id number]) [id number] Page 4 of 9 -223.70% 29.60% N/A % Reduction in Volume: Full Thickness Without Exposed Full Thickness Without Exposed N/A Classification: Support Structures Support Structures Medium Medium N/A Exudate A mount: Serosanguineous Purulent N/A Exudate Type: red, [name] yellow, [name], green N/A Exudate Color: No Yes N/A Foul Odor A Cleansing: fter N/A No N/A Odor Anticipated Due to Product Use: Flat Amber Intact N/A N/A Wound Margin: Medium (34-66%) Small (1-33%) N/A Granulation A mount: Red Pale, Friable N/A Granulation Quality: Medium (34-66%) Large (67-100%) N/A Necrotic Amount: Eschar, Adherent Slough Eschar, Adherent Slough N/A Necrotic Tissue: Fat Layer (Subcutaneous Tissue): Yes N/A N/A Exposed Structures: Fascia: No Tendon: No Muscle: No Joint: No Bone: No Small (1-33%) N/A N/A Epithelialization: Debridement - Selective/Open Wound Debridement - Excisional N/A Debridement: Pre-procedure Verification/Time Out 15:18 15:18 N/A Taken: Lidocaine 5% topical ointment Lidocaine 5% topical ointment N/A Pain Control: Slough Subcutaneous, Slough N/A Tissue Debrided: Non-Viable Tissue  Skin/Subcutaneous Tissue N/A Level: 28.32 2.4 N/A Debridement A (sq cm): rea Curette Curette N/A Instrument: Minimum Minimum N/A Bleeding: Pressure Pressure N/A Hemostasis A chieved: 0 0 N/A Procedural Pain: 0 0 N/A Post Procedural Pain: Procedure was tolerated well Procedure was tolerated well N/A Debridement Treatment Response: 5.9x4.8x0.1 1.5x1.6x0.1 N/A Post Debridement Measurements L x W x D (cm) 2.224 0.188 N/A Post Debridement Volume: (cm) Excoriation: No Excoriation: Yes N/A Periwound Skin Texture: Maceration: No Maceration: Yes N/A Periwound Skin Moisture: No Abnormalities Noted Erythema: Yes N/A Periwound Skin Color: Yes N/A N/A Tenderness on Palpation: N/A Debridement N/A Procedures Performed: Treatment Notes Electronic Signature(s) Signed: 10/30/2022 3:32:59 PM By: Fredirick Maudlin MD FACS Entered By: Fredirick Maudlin on 10/30/2022 15:32:59 -------------------------------------------------------------------------------- [place] Details Patient Name: Date of Service: KYNSLEE, BAHAM 10/30/2022 2:30 PM Medical Record Number: [id number] Patient Account Number: [id number] Date of Birth/Sex: Treating RN: [birth year] (76 y.o. [name] Primary Care Leanda Padmore: [name] Other Clinician: Referring Preslyn Warr: Treating Lyfe Reihl/Extender: [name] Session in Treatment: 13 Active Inactive Wound/Skin Impairment Nursing Diagnoses: Impaired tissue integrity Goals: Ulcer/skin breakdown will have a volume reduction of 30% by week 4 Date Initiated: 08/03/2022 Date Inactivated: 09/10/2022 Target Resolution Date: 08/31/2022 Unmet Reason: uncontrolled edema, Goal Status: Unmet GHINA, BITTINGER ([id number]) [id number] Page 5 of  9 Goal Status: Unmet acute infection Ulcer/skin breakdown will have a volume reduction of 50% by week 8 Date Initiated: 09/10/2022 Target Resolution Date:  04/05/2023 Goal Status: Active Interventions: Assess ulceration(s) every visit Treatment Activities: Skin care regimen initiated : 08/03/2022 Notes: Keystone ordered. 10/30 RX Levo started. 09/10/22 Electronic Signature(s) Signed: 10/30/2022 4:38:14 PM By: Dellie Catholic RN Entered By: Dellie Catholic on 10/30/2022 16:36:14 -------------------------------------------------------------------------------- Pain Assessment Details Patient Name: Date of Service: [name], [name] 10/30/2022 2:30 PM Medical Record Number: [id number] Patient Account Number: [id number] Date of Birth/Sex: Treating RN: Aug 02, 1946 (76 y.o. F) Primary Care Carson Meche: [name] Other Clinician: Referring Lateya Dauria: Treating Wardell Pokorski/Extender: [name] Session in Treatment: 13 Active Problems Location of Pain Severity Amber Description of Pain Patient Has Paino No Site Locations Pain Management Amber Medication Current Pain Management: Electronic Signature(s) Signed: 10/30/2022 3:21:29 PM By: Worthy Rancher Entered By: Worthy Rancher on 10/30/2022 14:59:36 Buth, [name] ([id number]) [id number] Page 6 of 9 -------------------------------------------------------------------------------- Patient/Caregiver Education Details Patient Name: Date of Service: [name], [name] 12/26/2023andnbsp2:30 PM Medical Record Number: [id number] Patient Account Number: [id number] Date of Birth/Gender: Treating RN: October 05, 1946 (76 y.o. [name] Primary Care Physician: [name] Other Clinician: Referring Physician: Treating Physician/Extender: [name] Session in Treatment: 13 Education Assessment Education Provided To: Patient Education Topics Provided Wound/Skin Impairment: Methods: Explain/Verbal Responses: Return demonstration correctly Electronic Signature(s) Signed: 10/30/2022 4:38:14 PM By: Dellie Catholic RN Entered By: Dellie Catholic on 10/30/2022 16:36:28 -------------------------------------------------------------------------------- Wound Assessment Details Patient Name: Date of Service: [name], [name] 10/30/2022 2:30 PM Medical Record Number: [id number] Patient Account Number: [id number] Date of Birth/Sex: Treating RN: 1945-12-06 (76 y.o. F) Primary Care Adeja Sarratt: [name] Other Clinician: Referring Kendelle Schweers: Treating Atlantis Delong/Extender: Pixie Casino Weeks in Treatment: 13 Wound Status Wound Number: 2 Primary Venous Leg Ulcer Etiology: Wound Location: Left, Posterior Lower Leg Wound Open Wounding Event: Blister Status: Date Acquired: 07/06/2022 Comorbid Cataracts, Asthma, Hypertension, Neuropathy, Received Weeks Of Treatment: 13 History: Chemotherapy, Received Radiation Clustered Wound: No Photos Wound Measurements Length: (cm) 5.9 Width: (cm) 4.8 Depth: (cm) 0.1 Area: (cm) 22.242 Volume: (cm) 2.224 % Reduction in Area: -223.7% % Reduction in Volume: -223.7% Epithelialization: Small (1-33%) Wound Description CASEY, MAXFIELD ([id number]) Classification: Full Thickness Without Exposed Support Structures Wound Margin: Flat Amber Intact Exudate Amount: Medium Exudate Type: Serosanguineous Exudate Color: red, [name] [id number] Page 7 of 9 Foul Odor After Cleansing: No Slough/Fibrino Yes Wound Bed Granulation Amount: Medium (34-66%) Exposed Structure Granulation Quality: Red Fascia Exposed: No Necrotic Amount: Medium (34-66%) Fat Layer (Subcutaneous Tissue) Exposed: Yes Necrotic Quality: Eschar, Adherent Slough Tendon Exposed: No Muscle Exposed: No Joint Exposed: No Bone Exposed: No Periwound Skin Texture Texture Color No Abnormalities Noted: No No Abnormalities Noted: Yes Excoriation: No Temperature / Pain Tenderness on Palpation: Yes Moisture No Abnormalities Noted: No Maceration: No Treatment Notes Wound #2 (Lower Leg)  Wound Laterality: Left, Posterior Cleanser Soap Amber Water Discharge Instruction: May shower Amber wash wound with dial antibacterial soap Amber water prior to dressing change. Peri-Wound Care Sween Lotion (Moisturizing lotion) Discharge Instruction: Apply moisturizing lotion as directed Topical Keystone Discharge Instruction: Apply as directed to wound Primary Dressing Sorbalgon AG Dressing, 4x4 (in/in) Discharge Instruction: Apply to wound bed as instructed Secondary Dressing Zetuvit Plus 4x8 in Discharge Instruction: Apply over primary dressing as directed. Secured With [employer] Surgical T 2x10 (in/yd) ape Discharge Instruction: Secure with tape as directed. Compression Wrap CoFlex TLC XL 2-layer Compression System 4x7 (in/yd) Discharge Instruction:  Apply Calamine CoFlex 2-layer compression as directed. (alt for 4 layer) Compression Stockings Add-Ons Electronic Signature(s) Signed: 10/30/2022 4:38:14 PM By: Dellie Catholic RN Entered By: Dellie Catholic on 10/30/2022 15:15:01 -------------------------------------------------------------------------------- Wound Assessment Details Patient Name: Date of Service: [name], [name] 10/30/2022 2:30 PM [name] ([id number]) [id number] Page 8 of 9 Medical Record Number: [id number] Patient Account Number: [id number] Date of Birth/Sex: Treating RN: 1946/06/12 (76 y.o. F) Primary Care Jansen Goodpasture: [name] Other Clinician: Referring Keiry Kowal: Treating Janna Oak/Extender: [name] Session in Treatment: 13 Wound Status Wound Number: 3 Primary Venous Leg Ulcer Etiology: Wound Location: Right, Lateral Ankle Wound Open Wounding Event: Contusion/Bruise Status: Date Acquired: 10/19/2022 Comorbid Cataracts, Asthma, Hypertension, Neuropathy, Received Weeks Of Treatment: 1 History: Chemotherapy, Received Radiation Clustered Wound: No Photos Wound  Measurements Length: (cm) 1.5 Width: (cm) 1.6 Depth: (cm) 0.1 Area: (cm) 1.885 Volume: (cm) 0.188 % Reduction in Area: 29.4% % Reduction in Volume: 29.6% Wound Description Classification: Full Thickness Without Exposed Suppor Exudate Amount: Medium Exudate Type: Purulent Exudate Color: yellow, [name], green t Structures Foul Odor After Cleansing: Yes Due to Product Use: No Slough/Fibrino Yes Wound Bed Granulation Amount: Small (1-33%) Granulation Quality: Pale, Friable Necrotic Amount: Large (67-100%) Necrotic Quality: Eschar, Adherent Slough Periwound Skin Texture Texture Color No Abnormalities Noted: No No Abnormalities Noted: No Excoriation: Yes Erythema: Yes Moisture No Abnormalities Noted: No Maceration: Yes Treatment Notes Wound #3 (Ankle) Wound Laterality: Right, Lateral Cleanser Soap Amber Water Discharge Instruction: May shower Amber wash wound with dial antibacterial soap Amber water prior to dressing change. Peri-Wound Care Sween Lotion (Moisturizing lotion) Discharge Instruction: Apply moisturizing lotion as directed Topical Primary Dressing IODOFLEX 0.9% Cadexomer Iodine Pad 4x6 cm Discharge Instruction: Apply to wound bed as instructed KATHEEN, [name] ([id number]) [id number] Page 9 of 9 Secondary Dressing Zetuvit Plus 4x8 in Discharge Instruction: Apply over primary dressing as directed. Secured With [employer] Surgical T 2x10 (in/yd) ape Discharge Instruction: Secure with tape as directed. Compression Wrap CoFlex TLC XL 2-layer Compression System 4x7 (in/yd) Discharge Instruction: Apply Calamine CoFlex 2-layer compression as directed. (alt for 4 layer) Compression Stockings Add-Ons Electronic Signature(s) Signed: 10/30/2022 4:38:14 PM By: Dellie Catholic RN Entered By: Dellie Catholic on 10/30/2022 15:15:35 -------------------------------------------------------------------------------- Vitals Details Patient  Name: Date of Service: [name]. 10/30/2022 2:30 PM Medical Record Number: [id number] Patient Account Number: [id number] Date of Birth/Sex: Treating RN: 1946-03-04 (76 y.o. F) Primary Care Ritaj Dullea: [name] Other Clinician: Referring Jerlene Rockers: Treating Laneah Luft/Extender: [name] Session in Treatment: 13 Vital Signs Time Taken: 02:55 Temperature (F): 98.4 Height (in): 63 Pulse (bpm): 72 Weight (lbs): 200 Respiratory Rate (breaths/min): 20 Body Mass Index (BMI): 35.4 Blood Pressure (mmHg): 178/91 Reference Range: 80 - 120 mg / dl Electronic Signature(s) Signed: 10/30/2022 3:21:29 PM By: Worthy Rancher Entered By: Worthy Rancher on 10/30/2022 14:59:18

## 2022-10-30 NOTE — Progress Notes (Signed)
Amber, [name] ([id number]) [id number] Page 1 of 12 Visit Report for 10/30/2022 Chief Complaint Document Details Patient Name: Date of Service: [name], [name] 10/30/2022 2:30 PM Medical Record Number: [id number] Patient Account Number: [id number] Date of Birth/Sex: Treating RN: 26-Oct-1946 (76 y.o. F) Primary Care Provider: Ria [name] Other Clinician: Referring Provider: Treating Provider/Extender: [name] Session in Treatment: 13 Information Obtained from: Patient Chief Complaint Patient presents for treatment of an open ulcer due to venous insufficiency Electronic Signature(s) Signed: 10/30/2022 3:33:07 PM By: Amber Maudlin MD FACS Entered By: [name] on 10/30/2022 15:33:07 -------------------------------------------------------------------------------- Debridement Details Patient Name: Date of Service: [name], [name] 10/30/2022 2:30 PM Medical Record Number: [id number] Patient Account Number: [id number] Date of Birth/Sex: Treating RN: [birth year] (76 y.o. [name] Primary Care Provider: Ria [name] Other Clinician: Referring Provider: Treating Provider/Extender: [name] Session in Treatment: 13 Debridement Performed for Assessment: Wound #3 Right,Lateral Ankle Performed By: Physician Amber Maudlin, MD Debridement Type: Debridement Severity of Tissue Pre Debridement: Fat layer exposed Level of Consciousness (Pre-procedure): Awake and Alert Pre-procedure Verification/Time Out Yes - 15:18 Taken: Start Time: 15:18 Pain Control: Lidocaine 5% topical ointment T Area Debrided (L x W): otal 1.5 (cm) x 1.6 (cm) = 2.4 (cm) Tissue and other material debrided: Non-Viable, Slough, Subcutaneous, Slough Level: Skin/Subcutaneous Tissue Debridement Description: Excisional Instrument: Curette Bleeding: Minimum Hemostasis Achieved: Pressure End Time: 15:20 Procedural  Pain: 0 Post Procedural Pain: 0 Response to Treatment: Procedure was tolerated well Level of Consciousness (Post- Awake and Alert procedure): Post Debridement Measurements of Total Wound Length: (cm) 1.5 Width: (cm) 1.6 Depth: (cm) 0.1 Volume: (cm) 0.188 Character of Wound/Ulcer Post Debridement: Improved Severity of Tissue Post Debridement: Fat layer exposed HETAL, PROANO ([id number]) [id number] Page 2 of 12 Post Procedure Diagnosis Same as Pre-procedure Notes Scribed for Dr. Celine [name] by J.Scotton Electronic Signature(s) Signed: 10/30/2022 3:38:13 PM By: Amber Maudlin MD FACS Signed: 10/30/2022 4:38:14 PM By: Amber Catholic RN Entered By: [name] on 10/30/2022 15:32:11 -------------------------------------------------------------------------------- Debridement Details Patient Name: Date of Service: [name], [name] 10/30/2022 2:30 PM Medical Record Number: [id number] Patient Account Number: [id number] Date of Birth/Sex: Treating RN: [birth year] (76 y.o. [name] Primary Care Provider: Ria [name] Other Clinician: Referring Provider: Treating Provider/Extender: [name] Session in Treatment: 13 Debridement Performed for Assessment: Wound #2 Left,Posterior Lower Leg Performed By: Physician Amber Maudlin, MD Debridement Type: Debridement Severity of Tissue Pre Debridement: Fat layer exposed Level of Consciousness (Pre-procedure): Awake and Alert Pre-procedure Verification/Time Out Yes - 15:18 Taken: Start Time: 15:18 Pain Control: Lidocaine 5% topical ointment T Area Debrided (L x W): otal 5.9 (cm) x 4.8 (cm) = 28.32 (cm) Tissue and other material debrided: Non-Viable, Slough, Slough Level: Non-Viable Tissue Debridement Description: Selective/Open Wound Instrument: Curette Bleeding: Minimum Hemostasis Achieved: Pressure End Time: 15:20 Procedural Pain: 0 Post Procedural Pain: 0 Response  to Treatment: Procedure was tolerated well Level of Consciousness (Post- Awake and Alert procedure): Post Debridement Measurements of Total Wound Length: (cm) 5.9 Width: (cm) 4.8 Depth: (cm) 0.1 Volume: (cm) 2.224 Character of Wound/Ulcer Post Debridement: Improved Severity of Tissue Post Debridement: Fat layer exposed Post Procedure Diagnosis Same as Pre-procedure Notes Scribed for Dr. Celine [name] by J.Scotton Electronic Signature(s) Signed: 10/30/2022 3:38:13 PM By: Amber Maudlin MD FACS Signed: 10/30/2022 4:38:14 PM By: Amber Catholic RN Entered By: [name] on 10/30/2022 15:33:15 [name] ([id number]) [id number] Page 3 of 12 -------------------------------------------------------------------------------- HPI Details Patient Name: Date of Service: [name], [name] 10/30/2022 2:30  PM Medical Record Number: [id number] Patient Account Number: [id number] Date of Birth/Sex: Treating RN: 10-Jun-1946 (76 y.o. F) Primary Care Provider: Ria [name] Other Clinician: Referring Provider: Treating Provider/Extender: [name] Session in Treatment: 13 History of Present Illness HPI Description: ADMISSION This is a 76 year old woman with a history of chronic venous insufficiency status post saphenous vein ablations in 2010 and 2016. 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 She presents today with an open venous ulcer on her left lower leg. It has been present for a little over 2 weeks. She saw her primary care provider who apparently swabbed the wound and grew out Pseudomonas. She just completed a course of ciprofloxacin for this. ABI was 0.91. She reports that she has had previous issues with ulcers in this same location, stemming back to a punch biopsy taken by a dermatologist many years ago. She has had several skin substitutes applied to the area that have ultimately resulted in healing on  prior occasions. She has 2 small ulcers on her left medial lower leg. There is slough accumulation in both of them. The more anterior of the 2 is quite small and has some soft slough on the surface, underneath which there is good granulation tissue. The more medial wound also has slough accumulation, but the underlying surface is fairly fibrotic and gritty. This is consistent with her provided history of multiple ulcers in the same location. 08/03/2022: The anterior wound is smaller today with just a little bit of slough accumulation. The more medial wound continues to be very sensitive and fibrotic with slough buildup. 08/13/2022: The anterior wound is closed. The more medial wound remains sensitive with a fairly fibrotic surface. There is more granulation tissue filling in, however, and there is less slough than on prior occasions. 08/20/2022: The anterior wound remains closed. The more medial wound has filled with granulation tissue but still has a fair amount of slough on the surface and remains fairly tender. Unfortunately, she has developed 2 small ulcers just proximal to this. The fat layer is exposed in each. She says that over the weekend, she felt a burning sensation in that location. 08/27/2022: The 2 small wounds proximal to the main wound have merged into a single site. The wounds look a little bit dry, but they are quite clean without significant slough accumulation. They remain quite tender. 09/03/2022: All of the wounds have now merged into 1 large wound. There is a strong odor coming from the wound and the surface is not particularly viable. It is extremely painful for her today. 09/10/2022: The wound is less black and purple this week but still does not look particularly viable. The surface is desiccated. The culture that I took last week returned with a polymicrobial population including Pseudomonas. I prescribed both Augmentin and levofloxacin. She has not yet picked up levofloxacin,  as her pharmacy only notified her yesterday that it was ready. We have ordered a Keystone topical compounded antibiotic, but it has not yet come. 09/17/2022: The wound surface is markedly improved today. There is still an area of grayish-looking muscle but the rest appears significantly more viable. There is still a layer of slough on the surface. She still has a couple days left of oral antibiotic therapy. She has her [employer] compounded topical antibiotic with her today. 09/24/2022: She has completed her oral antibiotic therapy. The wound surface is much cleaner today and more viable without any necrotic tissue. It is a bit desiccated, however. 10/01/2022:  The moisture balance of the wound has improved. There is a layer of yellow slough on the surface, but beneath this, the wound is more pink. 10/08/2022: The wound is smaller and cleaner today with a layer of slough present. She is having less pain. 12/18; this patient has a new wound on the right lateral ankle which apparently started after a cat scratched her. Secondarily she managed to drop a cake pan on the same area. This is very painful. The original wound was on the left posterior calf we have been using [employer] under an [employer]. The [employer] boot fell down and apparently she was in for a nurse visit perhaps last week and that 1 fell down as well This patient has central venous mediated hypertension. For member correctly she has a stent placed in the left common iliac artery by Dr. Donzetta Matters some years ago [facility] Thurner syndrome] she also has had venous ablations and I believe a history of a DVT The patient has compression pumps at home but she does not use them 10/30/2022: The patient says that her wounds burned all week with the [facility] classic in place. She has a fair amount of slough and nonviable subcutaneous tissue present on the right ankle. The left posterior calf wound is cleaner than I have seen it to date. Both remain  fairly tender. Electronic Signature(s) Signed: 10/30/2022 3:34:32 PM By: Amber Maudlin MD FACS Entered By: [name] on 10/30/2022 15:34:32 Curtiss, [name] ([id number]) [id number] Page 4 of 12 -------------------------------------------------------------------------------- Physical Exam Details Patient Name: Date of Service: [name], [name] 10/30/2022 2:30 PM Medical Record Number: [id number] Patient Account Number: [id number] Date of Birth/Sex: Treating RN: April 14, 1946 (76 y.o. F) Primary Care Provider: Ria [name] Other Clinician: Referring Provider: Treating Provider/Extender: [name] Session in Treatment: 13 Constitutional She is hypertensive, but asymptomatic.. . . . No acute distress. Respiratory Normal work of breathing on room air. Notes 10/30/2022: She has a fair amount of slough and nonviable subcutaneous tissue present on the right ankle. The left posterior calf wound is cleaner than I have seen it to date. Both remain fairly tender. Electronic Signature(s) Signed: 10/30/2022 3:35:18 PM By: Amber Maudlin MD FACS Entered By: [name] on 10/30/2022 15:35:18 -------------------------------------------------------------------------------- Physician Orders Details Patient Name: Date of Service: [name], [name] 10/30/2022 2:30 PM Medical Record Number: [id number] Patient Account Number: [id number] Date of Birth/Sex: Treating RN: [birth year] (76 y.o. [name] Primary Care Provider: Ria [name] Other Clinician: Referring Provider: Treating Provider/Extender: [name] Session in Treatment: [phone] Verbal / Phone Orders: No Diagnosis Coding Follow-up Appointments ppointment in 1 week. - Dr. Celine [name] RM 4 Return A Anesthetic Wound #2 Left,Posterior Lower Leg (In clinic) Topical Lidocaine 5% applied to wound bed - in clinic, prior to debridement Edema Control -  Lymphedema / SCD / Other Left Lower Extremity Lymphedema Pumps. Use Lymphedema pumps on leg(s) 2-3 times a day for 45-60 minutes. If wearing any wraps or hose, do not remove them. Continue exercising as instructed. - Use x1 per day Avoid standing for long periods of time. Patient to wear own compression stockings every day. - Right lower extremity Moisturize legs daily. Wound Treatment Wound #2 - Lower Leg Wound Laterality: Left, Posterior Cleanser: Soap and Water 1 x Per Week/30 Days Discharge Instructions: May shower and wash wound with dial antibacterial soap and water prior to dressing change. Peri-Wound Care: Sween Lotion (Moisturizing lotion) 1 x Per Week/30 Days Discharge Instructions: Apply moisturizing lotion as  directed Topical: Keystone 1 x Per Week/30 Days Discharge Instructions: Apply as directed to wound FLORINA, GLAS ([id number]) [phone].pdf Page 5 of 12 Prim Dressing: Sorbal[employer], 4x4 (in/in) 1 x Per Week/30 Days ary Discharge Instructions: Apply to wound bed as instructed Secondary Dressing: Zetuvit Plus 4x8 in 1 x Per Week/30 Days Discharge Instructions: Apply over primary dressing as directed. Secured With: 69M Medipore Public affairs consultant Surgical T 2x10 (in/yd) 1 x Per Week/30 Days ape Discharge Instructions: Secure with tape as directed. Compression Wrap: CoFlex TLC XL 2-layer Compression System 4x7 (in/yd) 1 x Per Week/30 Days Discharge Instructions: Apply Calamine CoFlex 2-layer compression as directed. (alt for 4 layer) Wound #3 - Ankle Wound Laterality: Right, Lateral Cleanser: Soap and Water 1 x Per Week/30 Days Discharge Instructions: May shower and wash wound with dial antibacterial soap and water prior to dressing change. Peri-Wound Care: Sween Lotion (Moisturizing lotion) 1 x Per Week/30 Days Discharge Instructions: Apply moisturizing lotion as directed Prim Dressing: IODOFLEX 0.9% Cadexomer Iodine Pad 4x6 cm 1 x Per Week/30  Days ary Discharge Instructions: Apply to wound bed as instructed Secondary Dressing: Zetuvit Plus 4x8 in 1 x Per Week/30 Days Discharge Instructions: Apply over primary dressing as directed. Secured With: 69M Medipore Public affairs consultant Surgical T 2x10 (in/yd) 1 x Per Week/30 Days ape Discharge Instructions: Secure with tape as directed. Compression Wrap: CoFlex TLC XL 2-layer Compression System 4x7 (in/yd) 1 x Per Week/30 Days Discharge Instructions: Apply Calamine CoFlex 2-layer compression as directed. (alt for 4 layer) Electronic Signature(s) Signed: 10/30/2022 3:38:13 PM By: Amber Maudlin MD FACS Signed: 10/30/2022 4:38:14 PM By: Amber Catholic RN Entered By: [name] on 10/30/2022 15:36:16 -------------------------------------------------------------------------------- Problem List Details Patient Name: Date of Service: CHE, BELOW 10/30/2022 2:30 PM Medical Record Number: [id number] Patient Account Number: [id number] Date of Birth/Sex: Treating RN: Apr 18, 1946 (76 y.o. F) Primary Care Provider: Ria [name] Other Clinician: Referring Provider: Treating Provider/Extender: [name] Session in Treatment: 13 Active Problems ICD-10 Encounter Code Description Active Date MDM Diagnosis [phone] Non-pressure chronic ulcer of other part of left lower leg with fat layer exposed9/22/2023 No Yes L97.818 Non-pressure chronic ulcer of other part of right lower leg with other specified 10/22/2022 No Yes severity I83.893 Varicose veins of bilateral lower extremities with other complications 5/46/5035 No Yes I87.2 Venous insufficiency (chronic) (peripheral) 07/27/2022 No Yes [name], [name] ([id number]) [id number] Page 6 of 12 G62.9 Polyneuropathy, unspecified 07/27/2022 No Yes R60.0 Localized edema 07/27/2022 No Yes L03.115 Cellulitis of right lower limb 10/22/2022 No Yes Inactive Problems Resolved Problems Electronic  Signature(s) Signed: 10/30/2022 3:32:49 PM By: Amber Maudlin MD FACS Entered By: [name] on 10/30/2022 15:32:48 -------------------------------------------------------------------------------- Progress Note Details Patient Name: Date of Service: [name] 10/30/2022 2:30 PM Medical Record Number: [id number] Patient Account Number: [id number] Date of Birth/Sex: Treating RN: August 15, 1946 (76 y.o. F) Primary Care Provider: Ria [name] Other Clinician: Referring Provider: Treating Provider/Extender: [name] Session in Treatment: 13 Subjective Chief Complaint Information obtained from Patient Patient presents for treatment of an open ulcer due to venous insufficiency History of Present Illness (HPI) ADMISSION This is a 76 year old woman with a history of chronic venous insufficiency status post saphenous vein ablations in 2010 and 2016. 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 She presents today with an open venous ulcer on her left lower leg. It has been present for a little over 2 weeks. She saw her primary care provider who apparently swabbed the wound and  grew out Pseudomonas. She just completed a course of ciprofloxacin for this. ABI was 0.91. She reports that she has had previous issues with ulcers in this same location, stemming back to a punch biopsy taken by a dermatologist many years ago. She has had several skin substitutes applied to the area that have ultimately resulted in healing on prior occasions. She has 2 small ulcers on her left medial lower leg. There is slough accumulation in both of them. The more anterior of the 2 is quite small and has some soft slough on the surface, underneath which there is good granulation tissue. The more medial wound also has slough accumulation, but the underlying surface is fairly fibrotic and gritty. This is consistent with her provided history of multiple ulcers in the  same location. 08/03/2022: The anterior wound is smaller today with just a little bit of slough accumulation. The more medial wound continues to be very sensitive and fibrotic with slough buildup. 08/13/2022: The anterior wound is closed. The more medial wound remains sensitive with a fairly fibrotic surface. There is more granulation tissue filling in, however, and there is less slough than on prior occasions. 08/20/2022: The anterior wound remains closed. The more medial wound has filled with granulation tissue but still has a fair amount of slough on the surface and remains fairly tender. Unfortunately, she has developed 2 small ulcers just proximal to this. The fat layer is exposed in each. She says that over the weekend, she felt a burning sensation in that location. 08/27/2022: The 2 small wounds proximal to the main wound have merged into a single site. The wounds look a little bit dry, but they are quite clean without significant slough accumulation. They remain quite tender. 09/03/2022: All of the wounds have now merged into 1 large wound. There is a strong odor coming from the wound and the surface is not particularly viable. It is extremely painful for her today. 09/10/2022: The wound is less black and purple this week but still does not look particularly viable. The surface is desiccated. The culture that I took last week returned with a polymicrobial population including Pseudomonas. I prescribed both Augmentin and levofloxacin. She has not yet picked up levofloxacin, as her pharmacy only notified her yesterday that it was ready. We have ordered a Keystone topical compounded antibiotic, but it has not yet come. 09/17/2022: The wound surface is markedly improved today. There is still an area of grayish-looking muscle but the rest appears significantly more viable. There is still a layer of slough on the surface. She still has a couple days left of oral antibiotic therapy. She has her  [employer]stone compounded topical antibiotic [name], [name] ([id number]) [id number] Page 7 of 12 with her today. 09/24/2022: She has completed her oral antibiotic therapy. The wound surface is much cleaner today and more viable without any necrotic tissue. It is a bit desiccated, however. 10/01/2022: The moisture balance of the wound has improved. There is a layer of yellow slough on the surface, but beneath this, the wound is more pink. 10/08/2022: The wound is smaller and cleaner today with a layer of slough present. She is having less pain. 12/18; this patient has a new wound on the right lateral ankle which apparently started after a cat scratched her. Secondarily she managed to drop a cake pan on the same area. This is very painful. The original wound was on the left posterior calf we have been using [employer] under an [employer]. The [employer]  boot fell down and apparently she was in for a nurse visit perhaps last week and that 1 fell down as well This patient has central venous mediated hypertension. For member correctly she has a stent placed in the left common iliac artery by Dr. Donzetta Matters some years ago [facility] Thurner syndrome] she also has had venous ablations and I believe a history of a DVT The patient has compression pumps at home but she does not use them 10/30/2022: The patient says that her wounds burned all week with the [facility] classic in place. She has a fair amount of slough and nonviable subcutaneous tissue present on the right ankle. The left posterior calf wound is cleaner than I have seen it to date. Both remain fairly tender. Patient History Information obtained from Patient. Family History Cancer - Mother,Paternal Grandparents, Diabetes - Siblings, Heart Disease - Father, Hypertension - Father. Social History Former smoker - quit in 1967. Medical History Eyes Patient has history of Cataracts - L eye Ear/Nose/Mouth/Throat Denies history of  Chronic sinus problems/congestion, Middle ear problems Hematologic/Lymphatic Denies history of Anemia, Human Immunodeficiency Virus, Lymphedema Respiratory Patient has history of Asthma Cardiovascular Patient has history of Hypertension Endocrine Denies history of Type I Diabetes, Type II Diabetes Neurologic Patient has history of Neuropathy - Feet and finger Denies history of Seizure Disorder Oncologic Patient has history of Received Chemotherapy - 2012, Received Radiation - 2012 Hospitalization/Surgery History - Lower extremity venography 2020. - Intravascular ultrasound. - peripheral vascular intervention. - melanoma 2011. Medical A Surgical History Notes nd Respiratory OSA on CPAP Cardiovascular Cronic venous insufficiency 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 Gastrointestinal liver cyst Musculoskeletal arthritis Neurologic restless leg syndrome Objective Constitutional She is hypertensive, but asymptomatic.Marland Kitchen No acute distress. Vitals Time Taken: 2:55 AM, Height: 63 in, Weight: 200 lbs, BMI: 35.4, Temperature: 98.4 F, Pulse: 72 bpm, Respiratory Rate: 20 breaths/min, Blood Pressure: 178/91 mmHg. Respiratory Normal work of breathing on room air. General Notes: 10/30/2022: She has a fair amount of slough and nonviable subcutaneous tissue present on the right ankle. The left posterior calf wound is cleaner than I have seen it to date. Both remain fairly tender. BRAILEE, RIEDE ([id number]) [id number] Page 8 of 12 Integumentary (Hair, Skin) Wound #2 status is Open. Original cause of wound was Blister. The date acquired was: 07/06/2022. The wound has been in treatment 13 weeks. The wound is located on the Left,Posterior Lower Leg. The wound measures 5.9cm length x 4.8cm width x 0.1cm depth; 22.242cm^2 area and 2.224cm^3 volume. There is Fat Layer (Subcutaneous Tissue) exposed. There is a medium amount of serosanguineous drainage  noted. The wound margin is flat and intact. There is medium (34-66%) red granulation within the wound bed. There is a medium (34-66%) amount of necrotic tissue within the wound bed including Eschar and Adherent Slough. The periwound skin appearance had no abnormalities noted for color. The periwound skin appearance did not exhibit: Excoriation, Maceration. The periwound has tenderness on palpation. Wound #3 status is Open. Original cause of wound was Contusion/Bruise. The date acquired was: 10/19/2022. The wound has been in treatment 1 weeks. The wound is located on the Right,Lateral Ankle. The wound measures 1.5cm length x 1.6cm width x 0.1cm depth; 1.885cm^2 area and 0.188cm^3 volume. There is a medium amount of purulent drainage noted. Foul odor after cleansing was noted. There is small (1-33%) pale, friable granulation within the wound bed. There is a large (67-100%) amount of necrotic tissue within the wound bed including Eschar and Adherent  Slough. The periwound skin appearance exhibited: Excoriation, Maceration, Erythema. The surrounding wound skin color is noted with erythema. 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 There was a Selective/Open Wound Non-Viable Tissue Debridement with a total area of 28.32 sq cm performed by Amber Maudlin, MD. With the following instrument(s): Curette to remove Non-Viable tissue/material. Material removed includes [facility] after  achieving pain control using Lidocaine 5% topical ointment. A time out was conducted at 15:18, prior to the start of the procedure. A Minimum amount of bleeding was controlled with Pressure. The procedure was tolerated well with a pain level of 0 throughout and a pain level of 0 following the procedure. Post Debridement Measurements: 5.9cm length x 4.8cm width x 0.1cm depth; 2.224cm^3 volume. Character of Wound/Ulcer Post Debridement is improved. Severity of Tissue Post Debridement is: Fat layer exposed. Post procedure Diagnosis Wound #2: Same as Pre-Procedure General Notes: Scribed for Dr. Celine [name] by J.Scotton. Pre-procedure diagnosis of Wound #2 is a Venous Leg Ulcer located on the Left,Posterior Lower Leg . There was a Double Layer Compression Therapy Procedure by Amber Catholic, RN. Post procedure Diagnosis Wound #2: Same as Pre-Procedure Notes: with Calamine. Wound #3 Pre-procedure diagnosis of Wound #3 is a Venous Leg Ulcer located on the Right,Lateral Ankle .Severity of Tissue Pre Debridement is: Fat layer exposed. There was a Excisional Skin/Subcutaneous Tissue Debridement with a total area of 2.4 sq cm performed by Amber Maudlin, MD. With the following instrument(s): Curette to remove Non-Viable tissue/material. Material removed includes Subcutaneous Tissue and Slough and after achieving pain control using Lidocaine 5% topical ointment. A time out was conducted at 15:18, prior to the start of the procedure. A Minimum amount of bleeding was controlled with Pressure. The procedure was tolerated well with a pain level of 0 throughout and a pain level of 0 following the procedure. Post Debridement Measurements: 1.5cm length x 1.6cm width x 0.1cm depth; 0.188cm^3 volume. Character of Wound/Ulcer Post Debridement is improved. Severity of Tissue Post Debridement is: Fat layer exposed. Post procedure Diagnosis Wound #3: Same as Pre-Procedure General Notes: Scribed for Dr. Celine [name] by  J.Scotton. Pre-procedure diagnosis of Wound #3 is a Venous Leg Ulcer located on the Right,Lateral Ankle . There was a Double Layer Compression Therapy Procedure by Amber Catholic, RN. Post procedure Diagnosis Wound #3: Same as Pre-Procedure Notes: with Calamine. Plan Follow-up Appointments: Return Appointment in 1 week. - Dr. Celine [name] RM 4 Anesthetic: Wound #2 Left,Posterior Lower Leg: (In clinic) Topical Lidocaine 5% applied to wound bed - in clinic, prior to debridement Edema Control - Lymphedema / SCD / Other: Lymphedema Pumps. Use Lymphedema pumps on leg(s) 2-3 times a day for 45-60 minutes. If wearing any wraps or hose, do not remove them. Continue exercising as instructed. - Use x1 per day Avoid standing for long periods of time. Patient to wear own compression stockings every day. - Right lower extremity Moisturize legs daily. WOUND #2: - Lower Leg Wound Laterality: Left, Posterior [name], [name] ([id number]) [id number] Page 9 of 12 Cleanser: Soap and Water 1 x Per Week/30 Days Discharge Instructions:  May shower and wash wound with dial antibacterial soap and water prior to dressing change. Peri-Wound Care: Sween Lotion (Moisturizing lotion) 1 x Per Week/30 Days Discharge Instructions: Apply moisturizing lotion as directed Topical: Keystone 1 x Per Week/30 Days Discharge Instructions: Apply as directed to wound Prim Dressing: Hydrofera Blue Classic Foam, 4x4 in 1 x Per Week/30 Days ary Discharge Instructions: Moisten with saline prior to applying to wound bed Secondary Dressing: Zetuvit Plus 4x8 in 1 x Per Week/30 Days Discharge Instructions: Apply over primary dressing as directed. Secured With: 82M Medipore Public affairs consultant Surgical T 2x10 (in/yd) 1 x Per Week/30 Days ape Discharge Instructions: Secure with tape as directed. Com pression Wrap: ThreePress (3 layer compression wrap) 1 x Per Week/30 Days Discharge Instructions: Apply three layer  compression as directed. WOUND #3: - Ankle Wound Laterality: Right, Lateral Cleanser: Soap and Water 1 x Per Week/30 Days Discharge Instructions: May shower and wash wound with dial antibacterial soap and water prior to dressing change. Peri-Wound Care: Sween Lotion (Moisturizing lotion) 1 x Per Week/30 Days Discharge Instructions: Apply moisturizing lotion as directed Topical: Gentamicin 1 x Per Week/30 Days Discharge Instructions: As directed by physician Topical: Santyl 1 x Per Week/30 Days Prim Dressing: Hydrofera Blue Classic Foam, 4x4 in 1 x Per Week/30 Days ary Discharge Instructions: Moisten with saline prior to applying to wound bed Secondary Dressing: Zetuvit Plus 4x8 in 1 x Per Week/30 Days Discharge Instructions: Apply over primary dressing as directed. Secured With: 82M Medipore Public affairs consultant Surgical T 2x10 (in/yd) 1 x Per Week/30 Days ape Discharge Instructions: Secure with tape as directed. Com pression Wrap: ThreePress (3 layer compression wrap) 1 x Per Week/30 Days Discharge Instructions: Apply three layer compression as directed. 10/30/2022: She has a fair amount of slough and nonviable subcutaneous tissue present on the right ankle. The left posterior calf wound is cleaner than I have seen it to date. Both remain fairly tender. I used a curette to debride slough and nonviable subcutaneous tissue from the right ankle wound. Debridement was limited secondary to patient intolerance and pain. I was able to debride slough off of the entire surface of the left posterior calf wound. The right ankle wound will benefit from ongoing chemical debridement so we will apply Iodoflex to this location. We will use Keystone topical antimicrobial on the left posterior calf wound with silver alginate. Will use calamine based Coflex to both legs. She will complete the oral course of Keflex as prescribed by Dr. Dellia Nims. Follow-up in 1 week. Electronic Signature(s) Signed: 10/30/2022 3:36:37 PM  By: Amber Maudlin MD FACS Entered By: [name] on 10/30/2022 15:36:36 -------------------------------------------------------------------------------- HxROS Details Patient Name: Date of Service: [name], [name] 10/30/2022 2:30 PM Medical Record Number: [id number] Patient Account Number: [id number] Date of Birth/Sex: Treating RN: [birth year] (76 y.o. F) Primary Care Provider: Ria [name] Other Clinician: Referring Provider: Treating Provider/Extender: [name] Session in Treatment: 13 Information Obtained From Patient Eyes Medical History: Positive for: Cataracts - L eye Ear/Nose/Mouth/Throat Medical History: Negative for: Chronic sinus problems/congestion; Middle ear problems Hematologic/Lymphatic Medical History: Negative for: Anemia; Human Immunodeficiency Virus; Lymphedema MERILYNN, HAYDU ([id number]) [id number] Page 10 of 12 Respiratory Medical History: Positive for: Asthma Past Medical History Notes: OSA on CPAP Cardiovascular Medical History: Positive for: Hypertension Past Medical History Notes: Cronic venous insufficiency 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 Gastrointestinal Medical History: Past Medical History Notes: liver cyst Endocrine Medical History: Negative for: Type I Diabetes; Type II Diabetes Musculoskeletal Medical History:  Past Medical History Notes: arthritis Neurologic Medical History: Positive for: Neuropathy - Feet and finger Negative for: Seizure Disorder Past Medical History Notes: restless leg syndrome Oncologic Medical History: Positive for: Received Chemotherapy - 2012; Received Radiation - 2012 HBO Extended History Items Eyes: Cataracts Immunizations Pneumococcal Vaccine: Received Pneumococcal Vaccination: Yes Received Pneumococcal Vaccination On or After 60th Birthday: Yes Implantable Devices No devices added Hospitalization /  Surgery History Type of Hospitalization/Surgery Lower extremity venography 2020 Intravascular ultrasound peripheral vascular intervention melanoma 2011 Family and Social History Cancer: Yes - Mother,Paternal Grandparents; Diabetes: Yes - Siblings; Heart Disease: Yes - Father; Hypertension: Yes - Father; Former smoker - quit in [employer]) Signed: 10/30/2022 3:38:13 PM By: Amber Maudlin MD FACS Entered By: [name] on 10/30/2022 15:34:39 [name] ([id number]) [id number] Page 11 of 12 -------------------------------------------------------------------------------- SuperBill Details Patient Name: Date of Service: [name], [name] 10/30/2022 Medical Record Number: [id number] Patient Account Number: [id number] Date of Birth/Sex: Treating RN: Mar 30, 1946 (76 y.o. F) Primary Care Provider: Ria [name] Other Clinician: Referring Provider: Treating Provider/Extender: [name] Session in Treatment: 13 Diagnosis Coding ICD-10 Codes Code Description [phone] Non-pressure chronic ulcer of other part of left lower leg with fat layer exposed L97.818 Non-pressure chronic ulcer of other part of right lower leg with other specified severity I83.893 Varicose veins of bilateral lower extremities with other complications D22.0 Venous insufficiency (chronic) (peripheral) G62.9 Polyneuropathy, unspecified R60.0 Localized edema L03.115 Cellulitis of right lower limb Facility Procedures : CPT4 Code: [id number] Description: 37628 - DEB SUBQ TISSUE 20 SQ CM/< ICD-10 Diagnosis Description L97.818 Non-pressure chronic ulcer of other part of right lower leg with other specified sev Modifier: erity Quantity: 1 : CPT4 Code: 31517616 Description: 07371 - DEBRIDE WOUND 1ST 20 SQ CM OR < ICD-10 Diagnosis Description L97.822 Non-pressure chronic ulcer of other part of left lower leg with fat layer  exposed Modifier: Quantity: 1 : CPT4 Code: [id number] Description: 46270 - DEBRIDE WOUND EA ADDL 20 SQ CM ICD-10 Diagnosis Description L97.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d Modifier: Quantity: 1 Physician Procedures : CPT4 Code Description Modifier [id number] 99214 - WC PHYS LEVEL 4 - EST PT 25 ICD-10 Diagnosis Description L97.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d L97.818 Non-pressure chronic ulcer of other part of right  lower leg with other specified severity I87.2 Venous insufficiency (chronic) (peripheral) R60.0 Localized edema Quantity: 1 : [id number] 11042 - WC PHYS SUBQ TISS 20 SQ CM ICD-10 Diagnosis Description L97.818 Non-pressure chronic ulcer of other part of right lower leg with other specified severity Quantity: 1 : [id number] 38101 - WC PHYS DEBR WO ANESTH 20 SQ CM ICD-10 Diagnosis Description L97.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d Quantity: 1 : [id number] 52778 - WC PHYS DEBR WO ANESTH EA ADD 20 CM ICD-10 Diagnosis Description L97.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d Quantity: 1 Electronic Signature(s) Signed: 10/30/2022 3:37:09 PM By: Amber Maudlin MD FACS Entered By: [name] on 10/30/2022 15:37:09 [name] ([id number]) [id number] Page 12 of 12

## 2022-10-31 ENCOUNTER — Ambulatory Visit (INDEPENDENT_AMBULATORY_CARE_PROVIDER_SITE_OTHER): Payer: Medicare Other | Admitting: Family Medicine

## 2022-10-31 ENCOUNTER — Encounter: Payer: Self-pay | Admitting: Family Medicine

## 2022-10-31 VITALS — BP 128/88 | HR 73 | Temp 97.2°F | Ht 61.0 in | Wt 202.0 lb

## 2022-10-31 DIAGNOSIS — E876 Hypokalemia: Secondary | ICD-10-CM | POA: Diagnosis not present

## 2022-10-31 DIAGNOSIS — R600 Localized edema: Secondary | ICD-10-CM | POA: Diagnosis not present

## 2022-10-31 DIAGNOSIS — Z85820 Personal history of malignant melanoma of skin: Secondary | ICD-10-CM | POA: Diagnosis not present

## 2022-10-31 DIAGNOSIS — E559 Vitamin D deficiency, unspecified: Secondary | ICD-10-CM

## 2022-10-31 DIAGNOSIS — M79604 Pain in right leg: Secondary | ICD-10-CM | POA: Diagnosis not present

## 2022-10-31 DIAGNOSIS — M79605 Pain in left leg: Secondary | ICD-10-CM

## 2022-10-31 DIAGNOSIS — M40202 Unspecified kyphosis, cervical region: Secondary | ICD-10-CM | POA: Diagnosis not present

## 2022-10-31 DIAGNOSIS — I872 Venous insufficiency (chronic) (peripheral): Secondary | ICD-10-CM

## 2022-10-31 DIAGNOSIS — K529 Noninfective gastroenteritis and colitis, unspecified: Secondary | ICD-10-CM | POA: Diagnosis not present

## 2022-10-31 DIAGNOSIS — Z7189 Other specified counseling: Secondary | ICD-10-CM | POA: Diagnosis not present

## 2022-10-31 DIAGNOSIS — G959 Disease of spinal cord, unspecified: Secondary | ICD-10-CM | POA: Diagnosis not present

## 2022-10-31 DIAGNOSIS — G4733 Obstructive sleep apnea (adult) (pediatric): Secondary | ICD-10-CM

## 2022-10-31 DIAGNOSIS — I83893 Varicose veins of bilateral lower extremities with other complications: Secondary | ICD-10-CM

## 2022-10-31 DIAGNOSIS — M5412 Radiculopathy, cervical region: Secondary | ICD-10-CM

## 2022-10-31 DIAGNOSIS — I10 Essential (primary) hypertension: Secondary | ICD-10-CM | POA: Diagnosis not present

## 2022-10-31 MED ORDER — FUROSEMIDE 40 MG PO TABS: 40.0000 mg | ORAL_TABLET | Freq: Every day | ORAL | 4 refills | Status: DC

## 2022-10-31 MED ORDER — ALBUTEROL SULFATE HFA 108 (90 BASE) MCG/ACT IN AERS: 1.0000 | INHALATION_SPRAY | Freq: Four times a day (QID) | RESPIRATORY_TRACT | 0 refills | Status: DC | PRN

## 2022-10-31 MED ORDER — VITAMIN D (ERGOCALCIFEROL) 1.25 MG (50000 UNIT) PO CAPS: ORAL_CAPSULE | ORAL | 4 refills | Status: DC

## 2022-10-31 MED ORDER — LOSARTAN POTASSIUM 50 MG PO TABS: 50.0000 mg | ORAL_TABLET | Freq: Every day | ORAL | 4 refills | Status: DC

## 2022-10-31 MED ORDER — MONTELUKAST SODIUM 10 MG PO TABS: 10.0000 mg | ORAL_TABLET | Freq: Every day | ORAL | 4 refills | Status: DC

## 2022-10-31 MED ORDER — POTASSIUM CHLORIDE CRYS ER 20 MEQ PO TBCR: 20.0000 meq | EXTENDED_RELEASE_TABLET | Freq: Two times a day (BID) | ORAL | 4 refills | Status: DC

## 2022-10-31 NOTE — Progress Notes (Signed)
Patient ID: [name], female    DOB: 03-15-1946, 76 y.o.   MRN: [id number]  This visit was conducted in person.  BP 128/88 (BP Location: Left Arm, Patient Position: Sitting, Cuff Size: Large)   Pulse 73   Temp (!) 97.2 F (36.2 C)   Ht '5\' 1"'$  (1.549 m)   Wt 202 lb (91.6 kg)   SpO2 99%   BMI 38.17 kg/m    CC: CPE Subjective:   HPI: [name] is a 76 y.o. female presenting on 10/31/2022 for Annual Exam (Part 1 10-19-22.)   Saw health advisor last week for medicare wellness visit. Note reviewed. Strained relationship with sister. Planning to return home full-time in the new year.   No results found.  Flowsheet Row Clinical Support from 10/19/2022 in [facility] [place]  PHQ-2 Total Score 0          10/19/2022   12:09 PM 04/20/2022    2:32 PM 10/13/2021   11:29 AM 11/09/2020   11:12 AM 10/11/2020   11:24 AM  Fall Risk   Falls in the past year? 0 0 1 0 1  Comment     tripped and fell  Number falls in past yr: 0 0 1 0 0  Injury with Fall? 0 0 0 0 0  Risk for fall due to : No Fall Risks  Other (Comment)  Medication side effect  Risk for fall due to: Comment   tripped    Follow up Falls prevention discussed;Falls evaluation completed  Falls prevention discussed Falls evaluation completed Falls evaluation completed;Falls prevention discussed   Compounded abx powder topically for chronic leg ulcers/wounds- gentamicin, vancomyin, levofloxacin.   Chronic diarrhea 3-4x/day - since hospitalization March 2023. Having bowel accidents every 1-2 weeks due to this. No fevers/chills, abd pain, nausea. No night time diarrhea. Multiple abx courses over the past few months. GI pathogen panel and C diff test were normal 07/2022. No relation of diarrhea to food. No pale stools or increased buoyancy. Recently started florastor with some benefit but still having diarrhea.   Preventative: COLONOSCOPY WITH PROPOFOL 01/06/2018 - diverticulosis, decreased sphincter tone, no f/u  needed [name], Kirke Corin, MD)  Well woman exam with OBGYN Dr Kennon Rounds last seen 05/2020. No pelvic pain, vaginal bleeding or skin changes. Mammogram 03/2022 [facility] at [facility].  DEXA 06/2016 WNL.  Lung cancer screening - not eligible Flu shot - yearly COVID vaccine Moderna 02/2020, 03/2020, booster 11/2020, bivalent booster 08/2021 Pneumovax 05/2013 , Prevnar-13 09/2014 Td 2020, Tdap 01/2022 Shingrix - discussed, planning to get. H/o shingles.  Advanced directives: doesn't want prolonged life support if terminal condition. Ok with CPR and intubation if necessary. Would be ok with feeding tube. HCPOA - would like [name] Half sister. Packet previously provided.  Seat belt use discussed.  Sunscreen use discussed. No changing moles on skin. Sees derm and onc yearly (h/o melanoma).  Ex smoker.  Alcohol - none  Dentist - due Eye exam yearly  Bowel - no constipation  Bladder - no incontinence   Lives alone. Sister and husband live next door   Lives in [place].   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   Edu: some college Activity: no regular exercise Diet: poor - good water, rare fruits/vegetables     Relevant past medical, surgical, family and social history reviewed and updated as indicated. Interim medical history since our last visit reviewed. Allergies and medications reviewed and updated. Outpatient  Medications Prior to Visit  Medication Sig Dispense Refill   acetaminophen (TYLENOL) 650 MG CR tablet Take 2 tablets (1,300 mg total) by mouth 2 (two) times daily as needed for pain.     aspirin EC 81 MG tablet Take 81 mg by mouth at bedtime. Melanoma prevention     cephALEXin (KEFLEX) 500 MG capsule Take 500 mg by mouth 4 (four) times daily.     loratadine (CLARITIN) 10 MG tablet Take 10 mg by mouth daily.     Multiple Vitamin (MULTIVITAMIN WITH MINERALS) TABS tablet Take 1 tablet by mouth daily.     Polyvinyl Alcohol-Povidone (TEARS PLUS OP) Place 1 drop into both  eyes daily as needed (dry eyes/ redness/ burning).      PRESCRIPTION MEDICATION CPAP     saccharomyces boulardii (FLORASTOR) 250 MG capsule Take 1 capsule (250 mg total) by mouth 2 (two) times daily.     triamcinolone (NASACORT) 55 MCG/ACT AERO nasal inhaler Place 2 sprays into the nose daily. In each nostril     albuterol (VENTOLIN HFA) 108 (90 Base) MCG/ACT inhaler INHALE 2 PUFFS BY MOUTH EVERY 6 HOURS AS NEEDED FOR WHEEZING AND SHORTNESS OF BREATH 8.5 each 0   furosemide (LASIX) 40 MG tablet TAKE 1 TABLET (40 MG TOTAL) BY MOUTH DAILY. TAKE SECOND DOSE IF NEEDED FOR LEG SWELLING/WEIGHT GAIN 60 tablet 2   losartan (COZAAR) 50 MG tablet TAKE 1 TABLET BY MOUTH EVERY DAY 90 tablet 1   montelukast (SINGULAIR) 10 MG tablet TAKE 1 TABLET BY MOUTH EVERY DAY 90 tablet 1   potassium chloride SA (KLOR-CON M20) 20 MEQ tablet TAKE 1 TABLET BY MOUTH TWICE A DAY 180 tablet 0   Vitamin D, Ergocalciferol, (DRISDOL) 1.25 MG (50000 UNIT) CAPS capsule TAKE 1 CAPSULE BY MOUTH ONCE A WEEK 12 capsule 1   No facility-administered medications prior to visit.     Per HPI unless specifically indicated in ROS section below Review of Systems  Objective:  BP 128/88 (BP Location: Left Arm, Patient Position: Sitting, Cuff Size: Large)   Pulse 73   Temp (!) 97.2 F (36.2 C)   Ht '5\' 1"'$  (1.549 m)   Wt 202 lb (91.6 kg)   SpO2 99%   BMI 38.17 kg/m   Wt Readings from Last 3 Encounters:  10/31/22 202 lb (91.6 kg)  10/24/22 206 lb 6.4 oz (93.6 kg)  08/15/22 198 lb (89.8 kg)      Physical Exam Vitals and nursing note reviewed.  Constitutional:      Appearance: Normal appearance. She is not ill-appearing.  HENT:     Head: Normocephalic and atraumatic.     Right Ear: Tympanic membrane, ear canal and external ear normal. There is no impacted cerumen.     Left Ear: Tympanic membrane, ear canal and external ear normal. There is no impacted cerumen.  Eyes:     General:        Right eye: No discharge.        Left eye:  No discharge.     Extraocular Movements: Extraocular movements intact.     Conjunctiva/sclera: Conjunctivae normal.     Pupils: Pupils are equal, round, and reactive to light.  Neck:     Thyroid: No thyroid mass or thyromegaly.     Vascular: No carotid bruit.  Cardiovascular:     Rate and Rhythm: Normal rate and regular rhythm.     Pulses: Normal pulses.     Heart sounds: Murmur (3/6 systolic) heard.  Pulmonary:  Effort: Pulmonary effort is normal. No respiratory distress.     Breath sounds: Normal breath sounds. No wheezing, rhonchi or rales.  Abdominal:     General: Bowel sounds are normal. There is no distension.     Palpations: Abdomen is soft. There is no mass.     Tenderness: There is no abdominal tenderness. There is no guarding or rebound.     Hernia: No hernia is present.  Musculoskeletal:        General: Swelling and tenderness present.     Cervical back: Normal range of motion and neck supple. No rigidity.     Comments: Wearing compression wraps bilateral legs/feet  Lymphadenopathy:     Cervical: No cervical adenopathy.  Skin:    General: Skin is warm and dry.     Findings: No rash.  Neurological:     General: No focal deficit present.     Mental Status: She is alert. Mental status is at baseline.  Psychiatric:        Mood and Affect: Mood normal.        Behavior: Behavior normal.       Results for orders placed or performed in visit on 10/24/22  Ferritin  Result Value Ref Range   Ferritin 64.3 10.0 - 291.0 ng/mL  IBC panel  Result Value Ref Range   Iron 55 42 - 145 ug/dL   Transferrin 208.0 (L) 212.0 - 360.0 mg/dL   Saturation Ratios 18.9 (L) 20.0 - 50.0 %   TIBC 291.2 250.0 - 450.0 mcg/dL  VITAMIN D 25 Hydroxy (Vit-D Deficiency, Fractures)  Result Value Ref Range   VITD 54.06 30.00 - 100.00 ng/mL  CBC with Differential/Platelet  Result Value Ref Range   WBC 7.2 4.0 - 10.5 K/uL   RBC 4.08 3.87 - 5.11 Mil/uL   Hemoglobin 12.3 12.0 - 15.0 g/dL   HCT  36.6 36.0 - 46.0 %   MCV 89.6 78.0 - 100.0 fl   MCHC 33.7 30.0 - 36.0 g/dL   RDW 14.0 11.5 - 15.5 %   Platelets 249.0 150.0 - 400.0 K/uL   Neutrophils Relative % 64.3 43.0 - 77.0 %   Lymphocytes Relative 26.4 12.0 - 46.0 %   Monocytes Relative 6.3 3.0 - 12.0 %   Eosinophils Relative 2.7 0.0 - 5.0 %   Basophils Relative 0.3 0.0 - 3.0 %   Neutro Abs 4.6 1.4 - 7.7 K/uL   Lymphs Abs 1.9 0.7 - 4.0 K/uL   Monocytes Absolute 0.5 0.1 - 1.0 K/uL   Eosinophils Absolute 0.2 0.0 - 0.7 K/uL   Basophils Absolute 0.0 0.0 - 0.1 K/uL  Comprehensive metabolic panel  Result Value Ref Range   Sodium 142 135 - 145 mEq/L   Potassium 3.8 3.5 - 5.1 mEq/L   Chloride 104 96 - 112 mEq/L   CO2 32 19 - 32 mEq/L   Glucose, Bld 86 70 - 99 mg/dL   BUN 14 6 - 23 mg/dL   Creatinine, Ser 0.77 0.40 - 1.20 mg/dL   Total Bilirubin 0.5 0.2 - 1.2 mg/dL   Alkaline Phosphatase 59 39 - 117 U/L   AST 13 0 - 37 U/L   ALT 16 0 - 35 U/L   Total Protein 6.8 6.0 - 8.3 g/dL   Albumin 3.9 3.5 - 5.2 g/dL   GFR 75.00 >60.00 mL/min   Calcium 9.4 8.4 - 10.5 mg/dL  Lipid panel  Result Value Ref Range   Cholesterol 149 0 - 200 mg/dL  Triglycerides 65.0 0.0 - 149.0 mg/dL   HDL 57.00 >39.00 mg/dL   VLDL 13.0 0.0 - 40.0 mg/dL   LDL Cholesterol 79 0 - 99 mg/dL   Total CHOL/HDL Ratio 3    NonHDL 91.68    *Note: Due to a large number of results and/or encounters for the requested time period, some results have not been displayed. A complete set of results can be found in Results Review.   Lab Results  Component Value Date   TSH 2.05 09/27/2021    Assessment & Plan:   Problem List Items Addressed This Visit     Advanced care planning/counseling discussion - Primary (Chronic)    Advanced directives: doesn't want prolonged life support if terminal condition. Ok with CPR and intubation if necessary. Would be ok with feeding tube. HCPOA - would like [name] Half sister. Packet previously provided.       History of melanoma     Continues regular onc and derm f/u.      Hypertension    Chronic, stable period on losartan '50mg'$  and lasix '40mg'$  daily.       Relevant Medications   furosemide (LASIX) 40 MG tablet   losartan (COZAAR) 50 MG tablet   Chronic venous insufficiency   Relevant Medications   furosemide (LASIX) 40 MG tablet   losartan (COZAAR) 50 MG tablet   OSA on CPAP   Vitamin D deficiency    Continue weekly vit D replacement.      Varicose veins of both lower extremities with complications    Continue wound care f/u - appreciate their care.      Relevant Medications   furosemide (LASIX) 40 MG tablet   losartan (COZAAR) 50 MG tablet   Cervical myelopathy with cervical radiculopathy (HCC)    Chronic issue with chronic neck pain.       Kyphosis of cervical region   Bilateral lower extremity edema   Leg pain, bilateral   Hypokalemia    Doing well on Kdur 58mq BID.       Chronic diarrhea    Ongoing since hospitalization 01/2022.  Infectious evaluation was negative 07/2022.  [name] had several antibiotic courses since then. Will repeat GI panel and C diff testing.  Not consistent with celiac disease, pancreatic insufficiency/malabsorption. ?microscopic colitis.  Will refer to GI for further evaluation - pt requests evaluation in [place]       Relevant Orders   Ambulatory referral to Gastroenterology   C. difficile GDH and Toxin A/B   GI pathogen panel by PCR, stool     Meds ordered this encounter  Medications   potassium chloride SA (KLOR-CON M20) 20 MEQ tablet    Sig: Take 1 tablet (20 mEq total) by mouth 2 (two) times daily.    Dispense:  180 tablet    Refill:  4   furosemide (LASIX) 40 MG tablet    Sig: Take 1 tablet (40 mg total) by mouth daily. Take second dose if needed for leg swelling/weight gain    Dispense:  100 tablet    Refill:  4   losartan (COZAAR) 50 MG tablet    Sig: Take 1 tablet (50 mg total) by mouth daily.    Dispense:  90 tablet    Refill:  4   montelukast  (SINGULAIR) 10 MG tablet    Sig: Take 1 tablet (10 mg total) by mouth daily.    Dispense:  90 tablet    Refill:  4   Vitamin D, Ergocalciferol, (DRISDOL) 1.25  MG (50000 UNIT) CAPS capsule    Sig: TAKE 1 CAPSULE BY MOUTH ONCE A WEEK    Dispense:  12 capsule    Refill:  4   albuterol (VENTOLIN HFA) 108 (90 Base) MCG/ACT inhaler    Sig: Inhale 1-2 puffs into the lungs every 6 (six) hours as needed for wheezing or shortness of breath.    Dispense:  8.5 each    Refill:  0   Orders Placed This Encounter  Procedures   GI pathogen panel by PCR, stool    Standing Status:   Future    Standing Expiration Date:   11/01/2023   C. difficile GDH and Toxin A/B    Standing Status:   Future    Standing Expiration Date:   11/01/2023   Ambulatory referral to Gastroenterology    Referral Priority:   Routine    Referral Type:   Consultation    Referral Reason:   Specialty Services Required    Number of Visits Requested:   1     Patient instructions: Pass by lab to repeat stool test.  We will refer you to GI for further evaluation of diarrhea.  Good to see you today Return in 3-4 months for follow up visit  Follow up plan: Return in about 4 months (around 03/02/2023) for follow up visit.  [name], MD

## 2022-10-31 NOTE — Assessment & Plan Note (Signed)
Continues regular onc and derm f/u.

## 2022-10-31 NOTE — Assessment & Plan Note (Addendum)
Continue wound care f/u - appreciate their care.

## 2022-10-31 NOTE — Assessment & Plan Note (Signed)
Ongoing since hospitalization 01/2022.  Infectious evaluation was negative 07/2022.  [name] had several antibiotic courses since then. Will repeat GI panel and C diff testing.  Not consistent with celiac disease, pancreatic insufficiency/malabsorption. ?microscopic colitis.  Will refer to GI for further evaluation - pt requests evaluation in [place].

## 2022-10-31 NOTE — Assessment & Plan Note (Signed)
Doing well on Kdur 46mq BID.

## 2022-10-31 NOTE — Assessment & Plan Note (Signed)
Chronic issue with chronic neck pain.

## 2022-10-31 NOTE — Assessment & Plan Note (Addendum)
Chronic, stable period on losartan '50mg'$  and lasix '40mg'$  daily.

## 2022-10-31 NOTE — Assessment & Plan Note (Signed)
Advanced directives: doesn't want prolonged life support if terminal condition. Ok with CPR and intubation if necessary. Would be ok with feeding tube. HCPOA - would like [name] Half sister. Packet previously provided.

## 2022-10-31 NOTE — Assessment & Plan Note (Signed)
Continue weekly vit D replacement.

## 2022-10-31 NOTE — Patient Instructions (Addendum)
Pass by lab to repeat stool test.  We will refer you to GI for further evaluation of diarrhea.  Good to see you today Return in 3-4 months for follow up visit  Health Maintenance After Age 76 After age 58, you are at a higher risk for certain long-term diseases and infections as well as injuries from falls. Falls are a major cause of broken bones and head injuries in people who are older than age 35. Getting regular preventive care can help to keep you healthy and well. Preventive care includes getting regular testing and making lifestyle changes as recommended by your health care provider. Talk with your health care provider about: Which screenings and tests you should have. A screening is a test that checks for a disease when you have no symptoms. A diet and exercise plan that is right for you. What should I know about screenings and tests to prevent falls? Screening and testing are the best ways to find a health problem early. Early diagnosis and treatment give you the best chance of managing medical conditions that are common after age 75. Certain conditions and lifestyle choices may make you more likely to have a fall. Your health care provider may recommend: Regular vision checks. Poor vision and conditions such as cataracts can make you more likely to have a fall. If you wear glasses, make sure to get your prescription updated if your vision changes. Medicine review. Work with your health care provider to regularly review all of the medicines you are taking, including over-the-counter medicines. Ask your health care provider about any side effects that may make you more likely to have a fall. Tell your health care provider if any medicines that you take make you feel dizzy or sleepy. Strength and balance checks. Your health care provider may recommend certain tests to check your strength and balance while standing, walking, or changing positions. Foot health exam. Foot pain and numbness, as well  as not wearing proper footwear, can make you more likely to have a fall. Screenings, including: Osteoporosis screening. Osteoporosis is a condition that causes the bones to get weaker and break more easily. Blood pressure screening. Blood pressure changes and medicines to control blood pressure can make you feel dizzy. Depression screening. You may be more likely to have a fall if you have a fear of falling, feel depressed, or feel unable to do activities that you used to do. Alcohol use screening. Using too much alcohol can affect your balance and may make you more likely to have a fall. Follow these instructions at home: Lifestyle Do not drink alcohol if: Your health care provider tells you not to drink. If you drink alcohol: Limit how much you have to: 0-1 drink a day for women. 0-2 drinks a day for men. Know how much alcohol is in your drink. In the [place] one drink equals one 12 oz bottle of beer (355 mL), one 5 oz glass of wine (148 mL), or one 1 oz glass of hard liquor (44 mL). Do not use any products that contain nicotine or tobacco. These products include cigarettes, chewing tobacco, and vaping devices, such as e-cigarettes. If you need help quitting, ask your health care provider. Activity  Follow a regular exercise program to stay fit. This will help you maintain your balance. Ask your health care provider what types of exercise are appropriate for you. If you need a cane or walker, use it as recommended by your health care provider. Wear supportive shoes  that have nonskid soles. Safety  Remove any tripping hazards, such as rugs, cords, and clutter. Install safety equipment such as grab bars in bathrooms and safety rails on stairs. Keep rooms and walkways well-lit. General instructions Talk with your health care provider about your risks for falling. Tell your health care provider if: You fall. Be sure to tell your health care provider about all falls, even ones that seem  minor. You feel dizzy, tiredness (fatigue), or off-balance. Take over-the-counter and prescription medicines only as told by your health care provider. These include supplements. Eat a healthy diet and maintain a healthy weight. A healthy diet includes low-fat dairy products, low-fat (lean) meats, and fiber from whole grains, beans, and lots of fruits and vegetables. Stay current with your vaccines. Schedule regular health, dental, and eye exams. Summary Having a healthy lifestyle and getting preventive care can help to protect your health and wellness after age 30. Screening and testing are the best way to find a health problem early and help you avoid having a fall. Early diagnosis and treatment give you the best chance for managing medical conditions that are more common for people who are older than age 76. Falls are a major cause of broken bones and head injuries in people who are older than age 31. Take precautions to prevent a fall at home. Work with your health care provider to learn what changes you can make to improve your health and wellness and to prevent falls. This information is not intended to replace advice given to you by your health care provider. Make sure you discuss any questions you have with your health care provider. Document Revised: 03/13/2021 Document Reviewed: 03/13/2021 Elsevier Patient Education  [place].

## 2022-11-01 ENCOUNTER — Encounter: Payer: Self-pay | Admitting: Podiatry

## 2022-11-01 ENCOUNTER — Ambulatory Visit (INDEPENDENT_AMBULATORY_CARE_PROVIDER_SITE_OTHER): Payer: Medicare Other | Admitting: Podiatry

## 2022-11-01 DIAGNOSIS — M79675 Pain in left toe(s): Secondary | ICD-10-CM

## 2022-11-01 DIAGNOSIS — B351 Tinea unguium: Secondary | ICD-10-CM | POA: Diagnosis not present

## 2022-11-01 DIAGNOSIS — M79674 Pain in right toe(s): Secondary | ICD-10-CM

## 2022-11-01 DIAGNOSIS — G629 Polyneuropathy, unspecified: Secondary | ICD-10-CM

## 2022-11-01 DIAGNOSIS — I872 Venous insufficiency (chronic) (peripheral): Secondary | ICD-10-CM | POA: Diagnosis not present

## 2022-11-01 NOTE — Progress Notes (Signed)
This patient returns to my office for at risk foot care.  This patient requires this care by a professional since this patient will be at risk due to having chronic venous insufficiency.    She is wearing unna boots on both legs/feet.  Patient is taking trental.This patient is unable to cut nails herself  since the patient cannot reach her  nails.These nails are painful walking and wearing shoes.  This patient presents for at risk foot care today.  General Appearance  Alert, conversant and in no acute stress.  Vascular  Dorsalis pedis and posterior tibial pulses aredeferred    Neurologic  Senn-Weinstein  WNL   diminished   Nails Thick disfigured discolored nails with subungual debris  from hallux to fifth toes bilaterally. No evidence of bacterial infection or drainage bilaterally. Pincer hallux nails  B/L.   Orthopedic  No limitations of motion  feet .  No crepitus or effusions noted.  No bony pathology or digital deformities noted. DJD [name]  B/L.  Skin  normotropic skin with no porokeratosis noted bilaterally.  No signs of infections or ulcers noted.   Unknown swelling dorsum of right foot.  Onychomycosis  Pain in right toes  Pain in left toes  Consent was obtained for treatment procedures.   Mechanical debridement of nails 1-5  bilaterally performed with a nail nipper.  Filed with dremel without incident. No infection or ulcer.     Return office visit   12   weeks        Told patient to return for periodic foot care and evaluation due to potential at risk complications.   Gardiner Barefoot DPM

## 2022-11-07 ENCOUNTER — Encounter (HOSPITAL_BASED_OUTPATIENT_CLINIC_OR_DEPARTMENT_OTHER): Payer: Medicare Other | Admitting: General Surgery

## 2022-11-07 ENCOUNTER — Telehealth: Payer: Self-pay | Admitting: Family Medicine

## 2022-11-07 MED ORDER — CHLORASEPTIC GARGLE 1.4 % MT LIQD: 1.0000 | OROMUCOSAL | 0 refills | Status: AC | PRN

## 2022-11-07 NOTE — Telephone Encounter (Signed)
Pt returned call would like a call back [phone]

## 2022-11-07 NOTE — Telephone Encounter (Signed)
Pt stated she's been dealing with a scratchy voice for a week & now she barely has a voice. Pt stated she saw Dr. Darnell Level on 10/31/22 for her cpe. Pt is asking for some meds to be prescribed? Pt states other than the scratchiness & no voice, she feels fine. Call back # [phone]

## 2022-11-07 NOTE — Telephone Encounter (Signed)
Spoke with patient and gave recommendations. She states she has been using OTC throat spray and lozenges. She denies any other s/s such as fever, shob, wheezing. She states overall she does not feel too bad. She will contact office if not improving, or if worsens. Nothing further needed at this time.

## 2022-11-07 NOTE — Telephone Encounter (Signed)
Sounds like she may have laryngitis which should resolve on its own over time.  If throat discomfort, she may try chloraseptic spray sent sent to pharmacy or throat lozenges - both should be OTC.  Rec good hydration, voice rest as well. Could try humidifier use if available.  Update if not improving over the next 1 wk.

## 2022-11-07 NOTE — Telephone Encounter (Signed)
LVM for return call. 

## 2022-11-07 NOTE — Telephone Encounter (Signed)
Please advise, thank you.

## 2022-11-12 NOTE — Telephone Encounter (Signed)
Spoke with pt asking about sxs. States she started with a ST and HA on 11/06/22. Those sxs have resolved but now has runny nose with green discharge and dry cough. Pt wants to avoid coming OV or going to UC. She has not done home Covid test. States she will do a test and call back with results.

## 2022-11-12 NOTE — Telephone Encounter (Signed)
Left a message on voicemail for patient to call the office back. 

## 2022-11-12 NOTE — Telephone Encounter (Signed)
Patient called back in and stated that her covid test was negative.

## 2022-11-12 NOTE — Telephone Encounter (Signed)
Patient called in and stated that she is still experiencing a runny nose. She stated that it is green and an occasion cough. She would like to know what is next. She is trying not to come in but would do whatever he suggest. Please advise. Thank you!

## 2022-11-13 NOTE — Telephone Encounter (Signed)
Lvm asking pt to call back. Plz schedule OV to be evaluated for sxs.

## 2022-11-13 NOTE — Telephone Encounter (Signed)
Noted  

## 2022-11-13 NOTE — Telephone Encounter (Signed)
Pt returned call . scheduled appointment with PCP

## 2022-11-14 ENCOUNTER — Encounter (HOSPITAL_BASED_OUTPATIENT_CLINIC_OR_DEPARTMENT_OTHER): Payer: Medicare Other | Attending: General Surgery | Admitting: General Surgery

## 2022-11-14 DIAGNOSIS — L03115 Cellulitis of right lower limb: Secondary | ICD-10-CM | POA: Diagnosis not present

## 2022-11-14 DIAGNOSIS — L97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d: Secondary | ICD-10-CM | POA: Insufficient documentation

## 2022-11-14 DIAGNOSIS — I872 Venous insufficiency (chronic) (peripheral): Secondary | ICD-10-CM | POA: Insufficient documentation

## 2022-11-14 DIAGNOSIS — R600 Localized edema: Secondary | ICD-10-CM | POA: Insufficient documentation

## 2022-11-14 DIAGNOSIS — G629 Polyneuropathy, unspecified: Secondary | ICD-10-CM | POA: Insufficient documentation

## 2022-11-14 DIAGNOSIS — L97818 Non-pressure chronic ulcer of other part of right lower leg with other specified severity: Secondary | ICD-10-CM | POA: Diagnosis not present

## 2022-11-14 DIAGNOSIS — L97312 Non-pressure chronic ulcer of right ankle with fat layer exposed: Secondary | ICD-10-CM | POA: Diagnosis not present

## 2022-11-14 DIAGNOSIS — L97222 Non-pressure chronic ulcer of left calf with fat layer exposed: Secondary | ICD-10-CM | POA: Diagnosis not present

## 2022-11-14 DIAGNOSIS — I83893 Varicose veins of bilateral lower extremities with other complications: Secondary | ICD-10-CM | POA: Diagnosis not present

## 2022-11-15 NOTE — Progress Notes (Signed)
[name], [name] ([id number]) [id number] Page 1 of 9 Visit Report for 11/14/2022 Arrival Information Details Patient Name: Date of Service: [name], [name] 11/14/2022 1:45 PM Medical Record Number: [id number] Patient Account Number: [id number] Date of Birth/Sex: Treating RN: 11/20/1945 (77 y.o. [name] Primary Care Dennys Guin: [name] Other Clinician: Referring Jayce Boyko: Treating Kimora Stankovic/Extender: [name] Session in Treatment: 15 Visit Information History Since Last Visit Added or deleted any medications: No Patient Arrived: Ambulatory Any new allergies or adverse reactions: No Arrival Time: 13:55 Had a fall or experienced change in No Accompanied By: self activities of daily living that may affect Transfer Assistance: Manual risk of falls: Patient Identification Verified: Yes Signs or symptoms of abuse/neglect since last visito No Patient Requires Transmission-Based Precautions: No Hospitalized since last visit: No Patient Has Alerts: No Implantable device outside of the clinic excluding No cellular tissue based products placed in the center since last visit: Has Dressing in Place as Prescribed: Yes Pain Present Now: Yes Electronic Signature(s) Signed: 11/14/2022 5:27:20 PM By: Dellie Catholic RN Entered By: Dellie Catholic on 11/14/2022 13:57:24 -------------------------------------------------------------------------------- Compression Therapy Details Patient Name: Date of Service: Amber, [name] 11/14/2022 1:45 PM Medical Record Number: [id number] Patient Account Number: [id number] Date of Birth/Sex: Treating RN: 09-May-1946 (77 y.o. [name] Primary Care Zendayah Hardgrave: [name] Other Clinician: Referring Charee Tumblin: Treating Othelia Riederer/Extender: [name] Session in Treatment: 15 Compression Therapy Performed for Wound Assessment: Wound #2 Left,Posterior Lower  Leg Performed By: Clinician Sharyn Creamer, RN Compression Type: Four Layer Post Procedure Diagnosis Same as Pre-procedure Electronic Signature(s) Signed: 11/14/2022 4:16:13 PM By: Sharyn Creamer RN, [name] Entered By: Sharyn Creamer on 11/14/2022 14:48:52 [name] ([id number]) [id number] Page 2 of 9 -------------------------------------------------------------------------------- Compression Therapy Details Patient Name: Date of Service: [name], [name] 11/14/2022 1:45 PM Medical Record Number: [id number] Patient Account Number: [id number] Date of Birth/Sex: Treating RN: 25-Apr-1946 (77 y.o. [name] Primary Care Allen Basista: [name] Other Clinician: Referring Presli Fanguy: Treating Randell Teare/Extender: [name] Session in Treatment: 15 Compression Therapy Performed for Wound Assessment: Wound #3 Right,Lateral Ankle Performed By: Clinician Sharyn Creamer, RN Compression Type: Four Layer Post Procedure Diagnosis Same as Pre-procedure Electronic Signature(s) Signed: 11/14/2022 4:16:13 PM By: Sharyn Creamer RN, [name] Entered By: Sharyn Creamer on 11/14/2022 14:49:15 -------------------------------------------------------------------------------- Encounter Discharge Information Details Patient Name: Date of Service: Amber, [name] 11/14/2022 1:45 PM Medical Record Number: [id number] Patient Account Number: [id number] Date of Birth/Sex: Treating RN: 02/27/1946 (77 y.o. [name] Primary Care Tovia Kisner: [name] Other Clinician: Referring Lakyra Tippins: Treating Rafael Salway/Extender: [name] Session in Treatment: 15 Encounter Discharge Information Items Post Procedure Vitals Discharge Condition: Stable Temperature (F): 98.2 Ambulatory Status: Ambulatory Pulse (bpm): 72 Discharge Destination: Home Respiratory Rate (breaths/min): 18 Transportation: Private Auto Blood Pressure (mmHg):  149/87 Accompanied By: self Schedule Follow-up Appointment: Yes Clinical Summary of Care: Patient Declined Electronic Signature(s) Signed: 11/14/2022 4:16:13 PM By: Sharyn Creamer RN, [name] Entered By: Sharyn Creamer on 11/14/2022 16:06:21 -------------------------------------------------------------------------------- Lower Extremity Assessment Details Patient Name: Date of Service: [name], [name] 11/14/2022 1:45 PM Medical Record Number: [id number] Patient Account Number: [id number] Date of Birth/Sex: Treating RN: 26-Jul-1946 (77 y.o. [name] Primary Care Zyad Boomer: [name] Other Clinician: Referring Marshe Shrestha: Treating Neysha Criado/Extender: Pixie Casino Weeks in Treatment: 15 Edema Assessment Assessed: [name]: No] [name]: No] [Left: Edema] [name]: :] [name], [name] ([id number]) [id number] Page 3 of 9 Left: Right: Point of Measurement: From Medial Instep 52.1 cm 48.2 cm Ankle  Left: Right: Point of Measurement: From Medial Instep 23.1 cm 23.4 cm Vascular Assessment Pulses: Dorsalis Pedis Palpable: [Left:Yes] [Right:Yes] Electronic Signature(s) Signed: 11/14/2022 5:27:20 PM By: Dellie Catholic RN Entered By: Dellie Catholic on 11/14/2022 14:10:39 -------------------------------------------------------------------------------- Multi Wound Chart Details Patient Name: Date of Service: [name], [name] 11/14/2022 1:45 PM Medical Record Number: [id number] Patient Account Number: [id number] Date of Birth/Sex: Treating RN: [birth year] (77 y.o. F) Primary Care Londyn Hotard: [name] Other Clinician: Referring Morgan Rennert: Treating Myrth Dahan/Extender: [name] Session in Treatment: 15 Vital Signs Height(in): 65 Pulse(bpm): 39 Weight(lbs): 200 Blood Pressure(mmHg): 149/87 Body Mass Index(BMI): 35.4 Temperature(F): 98.2 Respiratory Rate(breaths/min): 18 [2:Photos:] [N/A:N/A] Left,  Posterior Lower Leg Right, Lateral Ankle N/A Wound Location: Blister Contusion/Bruise N/A Wounding Event: Venous Leg Ulcer Venous Leg Ulcer N/A Primary Etiology: Cataracts, Asthma, Hypertension, Cataracts, Asthma, Hypertension, N/A Comorbid History: Neuropathy, Received Chemotherapy, Neuropathy, Received Chemotherapy, Received Radiation Received Radiation 07/06/2022 10/19/2022 N/A Date Acquired: 15 3 N/A Weeks of Treatment: Open Open N/A Wound Status: No No N/A Wound Recurrence: 5.8x4.2x0.1 1.3x1.2x0.1 N/A Measurements L x W x D (cm) 19.132 1.225 N/A A (cm) : rea 1.913 0.123 N/A Volume (cm) : -178.40% 54.10% N/A % Reduction in Area: -178.50% 53.90% N/A % Reduction in Volume: Full Thickness Without Exposed Full Thickness Without Exposed N/A Classification: Support Structures Support Structures Medium Medium N/A Exudate Amount: Serosanguineous Purulent N/A Exudate Type: red, [name] yellow, [name], green N/A Exudate Color: Flat and Intact N/A N/A Wound Margin: Medium (34-66%) Medium (34-66%) N/A Granulation Amount: Red Pale, Friable N/A Granulation QualityMARGAURITE, SALIDO ([id number]) [id number] Page 4 of 9 Medium (34-66%) Medium (34-66%) N/A Necrotic Amount: Eschar, Adherent Slough Eschar, Adherent Slough N/A Necrotic Tissue: Fat Layer (Subcutaneous Tissue): Yes N/A N/A Exposed Structures: Fascia: No Tendon: No Muscle: No Joint: No Bone: No Small (1-33%) None N/A Epithelialization: Debridement - Excisional Debridement - Excisional N/A Debridement: Pre-procedure Verification/Time Out 14:20 14:20 N/A Taken: Lidocaine 5% topical ointment Lidocaine 5% topical ointment N/A Pain Control: Necrotic/Eschar, Subcutaneous, Necrotic/Eschar, Subcutaneous, N/A Tissue Debrided: [employer] Skin/Subcutaneous Tissue Skin/Subcutaneous Tissue N/A Level: 24.36 1.56 N/A Debridement A (sq cm): rea Curette Curette N/A Instrument: Minimum  Minimum N/A Bleeding: Pressure Pressure N/A Hemostasis Achieved: 5 5 N/A Procedural Pain: 3 3 N/A Post Procedural Pain: Debridement Treatment Response: Procedure was tolerated well Procedure was tolerated well N/A Post Debridement Measurements L x 5.8x4.2x0.1 1.3x1.2x0.1 N/A W x D (cm) 1.913 0.123 N/A Post Debridement Volume: (cm) Excoriation: No Excoriation: Yes N/A Periwound Skin Texture: Maceration: No Maceration: Yes N/A Periwound Skin Moisture: No Abnormalities Noted Erythema: Yes N/A Periwound Skin Color: Yes N/A N/A Tenderness on Palpation: Debridement Debridement N/A Procedures Performed: Treatment Notes Electronic Signature(s) Signed: 11/14/2022 2:34:39 PM By: Fredirick Maudlin MD FACS Entered By: Fredirick Maudlin on 11/14/2022 14:34:39 -------------------------------------------------------------------------------- Multi-Disciplinary Care Plan Details Patient Name: Date of Service: [name], [name] 11/14/2022 1:45 PM Medical Record Number: [id number] Patient Account Number: [id number] Date of Birth/Sex: Treating RN: 16-Aug-1946 (77 y.o. [name] Primary Care Marico Buckle: [name] Other Clinician: Referring Philicia Heyne: Treating Starlina Lapre/Extender: [name] Session in Treatment: 15 Active Inactive Wound/Skin Impairment Nursing Diagnoses: Impaired tissue integrity Goals: Ulcer/skin breakdown will have a volume reduction of 30% by week 4 Date Initiated: 08/03/2022 Date Inactivated: 09/10/2022 Target Resolution Date: 08/31/2022 Unmet Reason: uncontrolled edema, Goal Status: Unmet acute infection Ulcer/skin breakdown will have a volume reduction of 50% by week 8 Date Initiated: 09/10/2022 Target Resolution Date: 04/05/2023 Goal Status: Active Interventions: Assess ulceration(s) every visit Treatment Activities: Skin  care regimen initiated : 08/03/2022 PAISLI, SILFIES ([id number]) [id number] Page 5 of  9 Notes: Keystone ordered. 10/30 RX Levo started. 09/10/22 Electronic Signature(s) Signed: 11/14/2022 4:16:13 PM By: Sharyn Creamer RN, [name] Entered By: Sharyn Creamer on 11/14/2022 14:14:14 -------------------------------------------------------------------------------- Pain Assessment Details Patient Name: Date of Service: [name], [name] 11/14/2022 1:45 PM Medical Record Number: [id number] Patient Account Number: [id number] Date of Birth/Sex: Treating RN: 1946-03-01 (77 y.o. [name] Primary Care Skai Lickteig: [name] Other Clinician: Referring Madisyn Mawhinney: Treating Gae Bihl/Extender: [name] Session in Treatment: 15 Active Problems Location of Pain Severity and Description of Pain Patient Has Paino Yes Site Locations Pain Location: Pain in Ulcers With Dressing Change: Yes Duration of the Pain. Constant / Intermittento Constant Rate the pain. Current Pain Level: 5 Worst Pain Level: 10 Least Pain Level: 3 Tolerable Pain Level: 5 Character of Pain Describe the Pain: Difficult to Pinpoint Pain Management and Medication Current Pain Management: Medication: Yes Cold Application: No Rest: Yes Massage: No Activity: No T.E.N.S.: No Heat Application: No Leg drop or elevation: No Is the Current Pain Management Adequate: Adequate How does your wound impact your activities of daily livingo Sleep: No Bathing: No Appetite: No Relationship With Others: No Bladder Continence: No Emotions: No Bowel Continence: No Work: No Toileting: No Drive: No Dressing: No Hobbies: No Electronic Signature(s) Signed: 11/14/2022 5:27:20 PM By: Dellie Catholic RN Entered By: Dellie Catholic on 11/14/2022 14:10:13 [name] ([id number]) [id number] Page 6 of 9 -------------------------------------------------------------------------------- Patient/Caregiver Education Details Patient Name: Date of Service: [name], [name]  1/10/2024andnbsp1:45 PM Medical Record Number: [id number] Patient Account Number: [id number] Date of Birth/Gender: Treating RN: October 20, 1946 (77 y.o. [name] Primary Care Physician: [name] Other Clinician: Referring Physician: Treating Physician/Extender: [name] Session in Treatment: 15 Education Assessment Education Provided To: Patient Education Topics Provided Wound/Skin Impairment: Methods: Explain/Verbal Responses: State content correctly [employer]) Signed: 11/14/2022 4:16:13 PM By: Sharyn Creamer RN, [name] Entered By: Sharyn Creamer on 11/14/2022 14:14:42 -------------------------------------------------------------------------------- Wound Assessment Details Patient Name: Date of Service: [name], [name] 11/14/2022 1:45 PM Medical Record Number: [id number] Patient Account Number: [id number] Date of Birth/Sex: Treating RN: 10-05-1946 (77 y.o. [name] Primary Care Oval Moralez: [name] Other Clinician: Referring Zanayah Shadowens: Treating Irving Lubbers/Extender: Pixie Casino Weeks in Treatment: 15 Wound Status Wound Number: 2 Primary Venous Leg Ulcer Etiology: Wound Location: Left, Posterior Lower Leg Wound Open Wounding Event: Blister Status: Date Acquired: 07/06/2022 Comorbid Cataracts, Asthma, Hypertension, Neuropathy, Received Weeks Of Treatment: 15 History: Chemotherapy, Received Radiation Clustered Wound: No Photos Wound Measurements Length: (cm) 5.8 Width: (cm) 4.2 JALEIYAH, ALAS J ([id number]) Depth: (cm) 0.1 Area: (cm) 19.132 Volume: (cm) 1.913 % Reduction in Area: -178.4% % Reduction in Volume: -178.5% [id number] Page 7 of 9 Epithelialization: Small (1-33%) Tunneling: No Undermining: No Wound Description Classification: Full Thickness Without Exposed Support Structures Wound Margin: Flat and Intact Exudate Amount: Medium Exudate Type:  Serosanguineous Exudate Color: red, [name] Foul Odor After Cleansing: No Slough/Fibrino Yes Wound Bed Granulation Amount: Medium (34-66%) Exposed Structure Granulation Quality: Red Fascia Exposed: No Necrotic Amount: Medium (34-66%) Fat Layer (Subcutaneous Tissue) Exposed: Yes Necrotic Quality: Eschar, Adherent Slough Tendon Exposed: No Muscle Exposed: No Joint Exposed: No Bone Exposed: No Periwound Skin Texture Texture Color No Abnormalities Noted: No No Abnormalities Noted: Yes Excoriation: No Temperature / Pain Tenderness on Palpation: Yes Moisture No Abnormalities Noted: No Maceration: No Treatment Notes Wound #2 (Lower Leg) Wound Laterality: Left, Posterior Cleanser Soap and Water Discharge Instruction: May  shower and wash wound with dial antibacterial soap and water prior to dressing change. Peri-Wound Care Sween Lotion (Moisturizing lotion) Discharge Instruction: Apply moisturizing lotion as directed Topical Keystone Discharge Instruction: Apply as directed to wound Primary Dressing Hydrofera Blue Ready Transfer Foam, 4x5 (in/in) Discharge Instruction: Apply to wound bed as instructed Secondary Dressing Zetuvit Plus 4x8 in Discharge Instruction: Apply over primary dressing as directed. Secured With [employer] Surgical T 2x10 (in/yd) ape Discharge Instruction: Secure with tape as directed. Compression Wrap CoFlex TLC XL 2-layer Compression System 4x7 (in/yd) Discharge Instruction: Apply Calamine CoFlex 2-layer compression as directed. (alt for 4 layer) Compression Stockings Add-Ons Electronic Signature(s) Signed: 11/14/2022 5:27:20 PM By: Dellie Catholic RN Entered By: Dellie Catholic on 11/14/2022 14:12:37 [name] ([id number]) [phone].pdf Page 8 of 9 -------------------------------------------------------------------------------- Wound Assessment Details Patient Name: Date of Service: [name], [name] 11/14/2022  1:45 PM Medical Record Number: [id number] Patient Account Number: [id number] Date of Birth/Sex: Treating RN: [birth year] (77 y.o. [name] Primary Care Yoceline Bazar: [name] Other Clinician: Referring Jeovanni Heuring: Treating Molly Maselli/Extender: Pixie Casino Weeks in Treatment: 15 Wound Status Wound Number: 3 Primary Venous Leg Ulcer Etiology: Wound Location: Right, Lateral Ankle Wound Open Wounding Event: Contusion/Bruise Status: Date Acquired: 10/19/2022 Comorbid Cataracts, Asthma, Hypertension, Neuropathy, Received Weeks Of Treatment: 3 History: Chemotherapy, Received Radiation Clustered Wound: No Photos Wound Measurements Length: (cm) 1.3 Width: (cm) 1.2 Depth: (cm) 0.1 Area: (cm) 1.225 Volume: (cm) 0.123 % Reduction in Area: 54.1% % Reduction in Volume: 53.9% Epithelialization: None Tunneling: No Undermining: No Wound Description Classification: Full Thickness Without Exposed Suppor Exudate Amount: Medium Exudate Type: Purulent Exudate Color: yellow, [name], green t Structures Foul Odor After Cleansing: No Slough/Fibrino Yes Wound Bed Granulation Amount: Medium (34-66%) Granulation Quality: Pale, Friable Necrotic Amount: Medium (34-66%) Necrotic Quality: Eschar, Adherent Slough Periwound Skin Texture Texture Color No Abnormalities Noted: No No Abnormalities Noted: No Excoriation: Yes Erythema: Yes Moisture No Abnormalities Noted: No Maceration: Yes Treatment Notes Wound #3 (Ankle) Wound Laterality: Right, Lateral Cleanser Soap and Water Discharge Instruction: May shower and wash wound with dial antibacterial soap and water prior to dressing change. Peri-Wound Care RAYVON, BRANDVOLD ([id number]) [id number] Page 9 of 9 Sween Lotion (Moisturizing lotion) Discharge Instruction: Apply moisturizing lotion as directed Topical Keystone Discharge Instruction: Apply as directed to wound Primary  Dressing Hydrofera Blue Ready Transfer Foam, 4x5 (in/in) Discharge Instruction: Apply to wound bed as instructed Secondary Dressing Zetuvit Plus 4x8 in Discharge Instruction: Apply over primary dressing as directed. Secured With [employer] Surgical T 2x10 (in/yd) ape Discharge Instruction: Secure with tape as directed. Compression Wrap CoFlex TLC XL 2-layer Compression System 4x7 (in/yd) Discharge Instruction: Apply Calamine CoFlex 2-layer compression as directed. (alt for 4 layer) Compression Stockings Add-Ons Electronic Signature(s) Signed: 11/14/2022 5:27:20 PM By: Dellie Catholic RN Entered By: Dellie Catholic on 11/14/2022 14:13:24 -------------------------------------------------------------------------------- Vitals Details Patient Name: Date of Service: LANIAH, GRIMM 11/14/2022 1:45 PM Medical Record Number: [id number] Patient Account Number: [id number] Date of Birth/Sex: Treating RN: 06-Oct-1946 (77 y.o. [name] Primary Care Calhoun Reichardt: [name] Other Clinician: Referring Ismerai Bin: Treating Keyara Ent/Extender: [name] Session in Treatment: 15 Vital Signs Time Taken: 13:55 Temperature (F): 98.2 Height (in): 63 Pulse (bpm): 72 Weight (lbs): 200 Respiratory Rate (breaths/min): 18 Body Mass Index (BMI): 35.4 Blood Pressure (mmHg): 149/87 Reference Range: 80 - 120 mg / dl Electronic Signature(s) Signed: 11/14/2022 5:27:20 PM By: Dellie Catholic RN Entered By: Dellie Catholic on 11/14/2022 14:09:41

## 2022-11-15 NOTE — Progress Notes (Signed)
[name], [name] ([id number]) [id number] Page 1 of 12 Visit Report for 11/14/2022 Chief Complaint Document Details Patient Name: Date of Service: [name] 11/14/2022 1:45 PM Medical Record Number: [id number] Patient Account Number: [id number] Date of Birth/Sex: Treating RN: 01-04-1946 (77 y.o. F) Primary Care Provider: [name] Other Clinician: Referring Provider: Treating Provider/Extender: [name] Session in Treatment: 15 Information Obtained from: Patient Chief Complaint Patient presents for treatment of an open ulcer due to venous insufficiency Electronic Signature(s) Signed: 11/14/2022 2:34:57 PM By: Fredirick Maudlin MD FACS Entered By: Fredirick Maudlin on 11/14/2022 14:34:56 -------------------------------------------------------------------------------- Debridement Details Patient Name: Date of Service: [name], [name] 11/14/2022 1:45 PM Medical Record Number: [id number] Patient Account Number: [id number] Date of Birth/Sex: Treating RN: [birth year] (77 y.o. [name] Primary Care Provider: [name] Other Clinician: Referring Provider: Treating Provider/Extender: [name] Session in Treatment: 15 Debridement Performed for Assessment: Wound #2 Left,Posterior Lower Leg Performed By: Physician Fredirick Maudlin, MD Debridement Type: Debridement Severity of Tissue Pre Debridement: Fat layer exposed Level of Consciousness (Pre-procedure): Awake and Alert Pre-procedure Verification/Time Out Yes - 14:20 Taken: Start Time: 14:20 Pain Control: Lidocaine 5% topical ointment T Area Debrided (L x W): otal 5.8 (cm) x 4.2 (cm) = 24.36 (cm) Tissue and other material debrided: Non-Viable, Eschar, Slough, Subcutaneous, Slough Level: Skin/Subcutaneous Tissue Debridement Description: Excisional Instrument: Curette Bleeding: Minimum Hemostasis Achieved: Pressure Procedural Pain:  5 Post Procedural Pain: 3 Response to Treatment: Procedure was tolerated well Level of Consciousness (Post- Awake and Alert procedure): Post Debridement Measurements of Total Wound Length: (cm) 5.8 Width: (cm) 4.2 Depth: (cm) 0.1 Volume: (cm) 1.913 Character of Wound/Ulcer Post Debridement: Improved Severity of Tissue Post Debridement: Fat layer exposed ADRA, SHEPLER ([id number]) [id number] Page 2 of 12 Post Procedure Diagnosis Same as Pre-procedure Notes Scribed for Dr Celine Ahr by Sharyn Creamer, Rn Electronic Signature(s) Signed: 11/14/2022 3:50:06 PM By: Fredirick Maudlin MD FACS Signed: 11/14/2022 4:16:13 PM By: Sharyn Creamer RN, [name] Entered By: Sharyn Creamer on 11/14/2022 14:26:13 -------------------------------------------------------------------------------- Debridement Details Patient Name: Date of Service: [name], [name] 11/14/2022 1:45 PM Medical Record Number: [id number] Patient Account Number: [id number] Date of Birth/Sex: Treating RN: July 02, 1946 (77 y.o. [name] Primary Care Provider: [name] Other Clinician: Referring Provider: Treating Provider/Extender: [name] Session in Treatment: 15 Debridement Performed for Assessment: Wound #3 Right,Lateral Ankle Performed By: Physician Fredirick Maudlin, MD Debridement Type: Debridement Severity of Tissue Pre Debridement: Fat layer exposed Level of Consciousness (Pre-procedure): Awake and Alert Pre-procedure Verification/Time Out Yes - 14:20 Taken: Start Time: 14:20 Pain Control: Lidocaine 5% topical ointment T Area Debrided (L x W): otal 1.3 (cm) x 1.2 (cm) = 1.56 (cm) Tissue and other material debrided: Non-Viable, Eschar, Slough, Subcutaneous, Slough Level: Skin/Subcutaneous Tissue Debridement Description: Excisional Instrument: Curette Bleeding: Minimum Hemostasis Achieved: Pressure Procedural Pain: 5 Post Procedural Pain: 3 Response to  Treatment: Procedure was tolerated well Level of Consciousness (Post- Awake and Alert procedure): Post Debridement Measurements of Total Wound Length: (cm) 1.3 Width: (cm) 1.2 Depth: (cm) 0.1 Volume: (cm) 0.123 Character of Wound/Ulcer Post Debridement: Improved Severity of Tissue Post Debridement: Fat layer exposed Post Procedure Diagnosis Same as Pre-procedure Notes Scribed for Dr Celine Ahr by Sharyn Creamer, Rn Electronic Signature(s) Signed: 11/14/2022 3:50:06 PM By: Fredirick Maudlin MD FACS Signed: 11/14/2022 4:16:13 PM By: Sharyn Creamer RN, [name] Entered By: Sharyn Creamer on 11/14/2022 14:26:27 [name], [name] ([id number]) [id number] Page 3 of 12 -------------------------------------------------------------------------------- HPI Details Patient Name: Date of Service: [name], [name]  11/14/2022 1:45 PM Medical Record Number: [id number] Patient Account Number: [id number] Date of Birth/Sex: Treating RN: Dec 15, 1945 (77 y.o. F) Primary Care Provider: [name] Other Clinician: Referring Provider: Treating Provider/Extender: [name] Session in Treatment: 15 History of Present Illness HPI Description: ADMISSION This is a 77 year old woman with a history of chronic venous insufficiency status post saphenous vein ablations in 2010 and 2016. 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 She presents today with an open venous ulcer on her left lower leg. It has been present for a little over 2 weeks. She saw her primary care provider who apparently swabbed the wound and grew out Pseudomonas. She just completed a course of ciprofloxacin for this. ABI was 0.91. She reports that she has had previous issues with ulcers in this same location, stemming back to a punch biopsy taken by a dermatologist many years ago. She has had several skin substitutes applied to the area that have ultimately resulted in healing on  prior occasions. She has 2 small ulcers on her left medial lower leg. There is slough accumulation in both of them. The more anterior of the 2 is quite small and has some soft slough on the surface, underneath which there is good granulation tissue. The more medial wound also has slough accumulation, but the underlying surface is fairly fibrotic and gritty. This is consistent with her provided history of multiple ulcers in the same location. 08/03/2022: The anterior wound is smaller today with just a little bit of slough accumulation. The more medial wound continues to be very sensitive and fibrotic with slough buildup. 08/13/2022: The anterior wound is closed. The more medial wound remains sensitive with a fairly fibrotic surface. There is more granulation tissue filling in, however, and there is less slough than on prior occasions. 08/20/2022: The anterior wound remains closed. The more medial wound has filled with granulation tissue but still has a fair amount of slough on the surface and remains fairly tender. Unfortunately, she has developed 2 small ulcers just proximal to this. The fat layer is exposed in each. She says that over the weekend, she felt a burning sensation in that location. 08/27/2022: The 2 small wounds proximal to the main wound have merged into a single site. The wounds look a little bit dry, but they are quite clean without significant slough accumulation. They remain quite tender. 09/03/2022: All of the wounds have now merged into 1 large wound. There is a strong odor coming from the wound and the surface is not particularly viable. It is extremely painful for her today. 09/10/2022: The wound is less black and purple this week but still does not look particularly viable. The surface is desiccated. The culture that I took last week returned with a polymicrobial population including Pseudomonas. I prescribed both Augmentin and levofloxacin. She has not yet picked up levofloxacin,  as her pharmacy only notified her yesterday that it was ready. We have ordered a Keystone topical compounded antibiotic, but it has not yet come. 09/17/2022: The wound surface is markedly improved today. There is still an area of grayish-looking muscle but the rest appears significantly more viable. There is still a layer of slough on the surface. She still has a couple days left of oral antibiotic therapy. She has her [employer] compounded topical antibiotic with her today. 09/24/2022: She has completed her oral antibiotic therapy. The wound surface is much cleaner today and more viable without any necrotic tissue. It is a bit desiccated,  however. 10/01/2022: The moisture balance of the wound has improved. There is a layer of yellow slough on the surface, but beneath this, the wound is more pink. 10/08/2022: The wound is smaller and cleaner today with a layer of slough present. She is having less pain. 12/18; this patient has a new wound on the right lateral ankle which apparently started after a cat scratched her. Secondarily she managed to drop a cake pan on the same area. This is very painful. The original wound was on the left posterior calf we have been using [employer] under an [employer]. The Unna boot fell down and apparently she was in for a nurse visit perhaps last week and that 1 fell down as well This patient has central venous mediated hypertension. For member correctly she has a stent placed in the left common iliac artery by Dr. Donzetta Matters some years ago [facility] Thurner syndrome] she also has had venous ablations and I believe a history of a DVT The patient has compression pumps at home but she does not use them 10/30/2022: The patient says that her wounds burned all week with the [facility] classic in place. She has a fair amount of slough and nonviable subcutaneous tissue present on the right ankle. The left posterior calf wound is cleaner than I have seen it to date. Both remain  fairly tender. 11/14/2022: Both wounds have substantial slough and eschar accumulation. They remain extremely tender. Electronic Signature(s) Signed: 11/14/2022 2:35:55 PM By: Fredirick Maudlin MD FACS Entered By: Fredirick Maudlin on 11/14/2022 14:35:54 [name] ([id number]) [id number] Page 4 of 12 -------------------------------------------------------------------------------- Physical Exam Details Patient Name: Date of Service: [name], [name] 11/14/2022 1:45 PM Medical Record Number: [id number] Patient Account Number: [id number] Date of Birth/Sex: Treating RN: [birth year] (77 y.o. F) Primary Care Provider: [name] Other Clinician: Referring Provider: Treating Provider/Extender: Pixie Casino Weeks in Treatment: 15 Constitutional Slightly hypertensive. . . . no acute distress. Respiratory Normal work of breathing on room air. Notes 11/14/2022: Both wounds have substantial slough and eschar accumulation. They remain extremely tender. Electronic Signature(s) Signed: 11/14/2022 2:36:26 PM By: Fredirick Maudlin MD FACS Entered By: Fredirick Maudlin on 11/14/2022 14:36:25 -------------------------------------------------------------------------------- Physician Orders Details Patient Name: Date of Service: [name], [name] 11/14/2022 1:45 PM Medical Record Number: [id number] Patient Account Number: [id number] Date of Birth/Sex: Treating RN: [birth year] (77 y.o. [name] Primary Care Provider: [name] Other Clinician: Referring Provider: Treating Provider/Extender: [name] Session in Treatment: 15 Verbal / Phone Orders: No Diagnosis Coding ICD-10 Coding Code Description [phone] Non-pressure chronic ulcer of other part of left lower leg with fat layer exposed L97.818 Non-pressure chronic ulcer of other part of right lower leg with other specified severity I83.893 Varicose veins of  bilateral lower extremities with other complications P50.9 Venous insufficiency (chronic) (peripheral) G62.9 Polyneuropathy, unspecified R60.0 Localized edema L03.115 Cellulitis of right lower limb Follow-up Appointments ppointment in 1 week. - Dr. Celine Ahr RM 4 Return A Anesthetic Wound #2 Left,Posterior Lower Leg (In clinic) Topical Lidocaine 5% applied to wound bed - in clinic, prior to debridement Edema Control - Lymphedema / SCD / Other Left Lower Extremity Lymphedema Pumps. Use Lymphedema pumps on leg(s) 2-3 times a day for 45-60 minutes. If wearing any wraps or hose, do not remove them. Continue exercising as instructed. - Use x1 per day Avoid standing for long periods of time. Patient to wear own compression stockings every day. - Right lower extremity Moisturize legs daily. Taul, Lupie J (  [id number]) [id number] Page 5 of 12 Wound Treatment Wound #2 - Lower Leg Wound Laterality: Left, Posterior Cleanser: Soap and Water 1 x Per Week/30 Days Discharge Instructions: May shower and wash wound with dial antibacterial soap and water prior to dressing change. Peri-Wound Care: Sween Lotion (Moisturizing lotion) 1 x Per Week/30 Days Discharge Instructions: Apply moisturizing lotion as directed Topical: Keystone 1 x Per Week/30 Days Discharge Instructions: Apply as directed to wound Prim Dressing: Hydrofera Blue Ready Transfer Foam, 4x5 (in/in) 1 x Per Week/30 Days ary Discharge Instructions: Apply to wound bed as instructed Secondary Dressing: Zetuvit Plus 4x8 in 1 x Per Week/30 Days Discharge Instructions: Apply over primary dressing as directed. Secured With: 31M Medipore Public affairs consultant Surgical T 2x10 (in/yd) 1 x Per Week/30 Days ape Discharge Instructions: Secure with tape as directed. Compression Wrap: CoFlex TLC XL 2-layer Compression System 4x7 (in/yd) 1 x Per Week/30 Days Discharge Instructions: Apply Calamine CoFlex 2-layer compression as directed.  (alt for 4 layer) Wound #3 - Ankle Wound Laterality: Right, Lateral Cleanser: Soap and Water 1 x Per Week/30 Days Discharge Instructions: May shower and wash wound with dial antibacterial soap and water prior to dressing change. Peri-Wound Care: Sween Lotion (Moisturizing lotion) 1 x Per Week/30 Days Discharge Instructions: Apply moisturizing lotion as directed Topical: Keystone 1 x Per Week/30 Days Discharge Instructions: Apply as directed to wound Prim Dressing: Hydrofera Blue Ready Transfer Foam, 4x5 (in/in) 1 x Per Week/30 Days ary Discharge Instructions: Apply to wound bed as instructed Secondary Dressing: Zetuvit Plus 4x8 in 1 x Per Week/30 Days Discharge Instructions: Apply over primary dressing as directed. Secured With: 31M Medipore Public affairs consultant Surgical T 2x10 (in/yd) 1 x Per Week/30 Days ape Discharge Instructions: Secure with tape as directed. Compression Wrap: CoFlex TLC XL 2-layer Compression System 4x7 (in/yd) 1 x Per Week/30 Days Discharge Instructions: Apply Calamine CoFlex 2-layer compression as directed. (alt for 4 layer) Patient Medications llergies: sulfa antibiotics, Voltaren, doxycycline, Neoprene, adhesive, latex A Notifications Medication Indication Start End prior to debridement 11/14/2022 lidocaine DOSE topical 5 % ointment - ointment topical once daily Electronic Signature(s) Signed: 11/14/2022 3:50:06 PM By: Fredirick Maudlin MD FACS Entered By: Fredirick Maudlin on 11/14/2022 14:36:42 -------------------------------------------------------------------------------- Problem List Details Patient Name: Date of Service: [name], [name] 11/14/2022 1:45 PM Medical Record Number: [id number] Patient Account Number: [id number] Date of Birth/Sex: Treating RN: 04-12-1946 (77 y.o. F) Primary Care Provider: [name] Other Clinician: Referring Provider: Treating Provider/Extender: [name] Session in Treatment: [address], [place] J  ([id number]) [id number] Page 6 of 12 Active Problems ICD-10 Encounter Code Description Active Date MDM Diagnosis L97.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d9/22/2023 No Yes L97.818 Non-pressure chronic ulcer of other part of right lower leg with other specified 10/22/2022 No Yes severity I83.893 Varicose veins of bilateral lower extremities with other complications 2/37/6283 No Yes I87.2 Venous insufficiency (chronic) (peripheral) 07/27/2022 No Yes G62.9 Polyneuropathy, unspecified 07/27/2022 No Yes R60.0 Localized edema 07/27/2022 No Yes L03.115 Cellulitis of right lower limb 10/22/2022 No Yes Inactive Problems Resolved Problems Electronic Signature(s) Signed: 11/14/2022 2:34:33 PM By: Fredirick Maudlin MD FACS Entered By: Fredirick Maudlin on 11/14/2022 14:34:32 -------------------------------------------------------------------------------- Progress Note Details Patient Name: Date of Service: [name], [name] 11/14/2022 1:45 PM Medical Record Number: [id number] Patient Account Number: [id number] Date of Birth/Sex: Treating RN: 03/21/1946 (78 y.o. F) Primary Care Provider: [name] Other Clinician: Referring Provider: Treating Provider/Extender: [name] Session in Treatment: 15 Subjective Chief  Complaint Information obtained from Patient Patient presents for treatment of an open ulcer due to venous insufficiency History of Present Illness (HPI) ADMISSION This is a 77 year old woman with a history of chronic venous insufficiency status post saphenous vein ablations in 2010 and 2016. 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 She presents today with an open venous ulcer on her left lower leg. It has been present for a little over 2 weeks. She saw her primary care provider who apparently swabbed the wound and grew out Pseudomonas. She just completed a  course of ciprofloxacin for this. ABI was 0.91. She reports that she has had previous issues with ulcers in this same location, stemming back to a punch biopsy taken by a dermatologist many years ago. She has had several skin substitutes applied to the area that have ultimately resulted in healing on prior occasions. COOKIE, PORE ([id number]) [id number] Page 7 of 12 She has 2 small ulcers on her left medial lower leg. There is slough accumulation in both of them. The more anterior of the 2 is quite small and has some soft slough on the surface, underneath which there is good granulation tissue. The more medial wound also has slough accumulation, but the underlying surface is fairly fibrotic and gritty. This is consistent with her provided history of multiple ulcers in the same location. 08/03/2022: The anterior wound is smaller today with just a little bit of slough accumulation. The more medial wound continues to be very sensitive and fibrotic with slough buildup. 08/13/2022: The anterior wound is closed. The more medial wound remains sensitive with a fairly fibrotic surface. There is more granulation tissue filling in, however, and there is less slough than on prior occasions. 08/20/2022: The anterior wound remains closed. The more medial wound has filled with granulation tissue but still has a fair amount of slough on the surface and remains fairly tender. Unfortunately, she has developed 2 small ulcers just proximal to this. The fat layer is exposed in each. She says that over the weekend, she felt a burning sensation in that location. 08/27/2022: The 2 small wounds proximal to the main wound have merged into a single site. The wounds look a little bit dry, but they are quite clean without significant slough accumulation. They remain quite tender. 09/03/2022: All of the wounds have now merged into 1 large wound. There is a strong odor coming from the wound and the  surface is not particularly viable. It is extremely painful for her today. 09/10/2022: The wound is less black and purple this week but still does not look particularly viable. The surface is desiccated. The culture that I took last week returned with a polymicrobial population including Pseudomonas. I prescribed both Augmentin and levofloxacin. She has not yet picked up levofloxacin, as her pharmacy only notified her yesterday that it was ready. We have ordered a Keystone topical compounded antibiotic, but it has not yet come. 09/17/2022: The wound surface is markedly improved today. There is still an area of grayish-looking muscle but the rest appears significantly more viable. There is still a layer of slough on the surface. She still has a couple days left of oral antibiotic therapy. She has her [employer] compounded topical antibiotic with her today. 09/24/2022: She has completed her oral antibiotic therapy. The wound surface is much cleaner today and more viable without any necrotic tissue. It is a bit desiccated, however. 10/01/2022: The moisture balance of the wound has improved. There is a layer  of yellow slough on the surface, but beneath this, the wound is more pink. 10/08/2022: The wound is smaller and cleaner today with a layer of slough present. She is having less pain. 12/18; this patient has a new wound on the right lateral ankle which apparently started after a cat scratched her. Secondarily she managed to drop a cake pan on the same area. This is very painful. The original wound was on the left posterior calf we have been using [employer] under an [employer]. The Unna boot fell down and apparently she was in for a nurse visit perhaps last week and that 1 fell down as well This patient has central venous mediated hypertension. For member correctly she has a stent placed in the left common iliac artery by Dr. Donzetta Matters some years ago [facility] Thurner syndrome] she also has had venous  ablations and I believe a history of a DVT The patient has compression pumps at home but she does not use them 10/30/2022: The patient says that her wounds burned all week with the [facility] classic in place. She has a fair amount of slough and nonviable subcutaneous tissue present on the right ankle. The left posterior calf wound is cleaner than I have seen it to date. Both remain fairly tender. 11/14/2022: Both wounds have substantial slough and eschar accumulation. They remain extremely tender. Patient History Information obtained from Patient. Family History Cancer - Mother,Paternal Grandparents, Diabetes - Siblings, Heart Disease - Father, Hypertension - Father. Social History Former smoker - quit in 1967. Medical History Eyes Patient has history of Cataracts - L eye Ear/Nose/Mouth/Throat Denies history of Chronic sinus problems/congestion, Middle ear problems Hematologic/Lymphatic Denies history of Anemia, Human Immunodeficiency Virus, Lymphedema Respiratory Patient has history of Asthma Cardiovascular Patient has history of Hypertension Endocrine Denies history of Type I Diabetes, Type II Diabetes Neurologic Patient has history of Neuropathy - Feet and finger Denies history of Seizure Disorder Oncologic Patient has history of Received Chemotherapy - 2012, Received Radiation - 2012 Hospitalization/Surgery History - Lower extremity venography 2020. - Intravascular ultrasound. - peripheral vascular intervention. - melanoma 2011. Medical A Surgical History Notes nd Respiratory OSA on CPAP Cardiovascular Cronic venous insufficiency 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 Gastrointestinal liver cyst Musculoskeletal arthritis Neurologic [name], [name] ([id number]) [id number] Page 8 of 12 restless leg syndrome Objective Constitutional Slightly hypertensive. no acute distress. Vitals Time Taken: 1:55 PM, Height: 63 in,  Weight: 200 lbs, BMI: 35.4, Temperature: 98.2 F, Pulse: 72 bpm, Respiratory Rate: 18 breaths/min, Blood Pressure: 149/87 mmHg. Respiratory Normal work of breathing on room air. General Notes: 11/14/2022: Both wounds have substantial slough and eschar accumulation. They remain extremely tender. Integumentary (Hair, Skin) Wound #2 status is Open. Original cause of wound was Blister. The date acquired was: 07/06/2022. The wound has been in treatment 15 weeks. The wound is located on the Left,Posterior Lower Leg. The wound measures 5.8cm length x 4.2cm width x 0.1cm depth; 19.132cm^2 area and 1.913cm^3 volume. There is Fat Layer (Subcutaneous Tissue) exposed. There is no tunneling or undermining noted. There is a medium amount of serosanguineous drainage noted. The wound margin is flat and intact. There is medium (34-66%) red granulation within the wound bed. There is a medium (34-66%) amount of necrotic tissue within the wound bed including Eschar and Adherent Slough. The periwound skin appearance had no abnormalities noted for color. The periwound skin appearance did not exhibit: Excoriation, Maceration. The periwound has tenderness on palpation. Wound #3 status is Open. Original  cause of wound was Contusion/Bruise. The date acquired was: 10/19/2022. The wound has been in treatment 3 weeks. The wound is located on the Right,Lateral Ankle. The wound measures 1.3cm length x 1.2cm width x 0.1cm depth; 1.225cm^2 area and 0.123cm^3 volume. There is no tunneling or undermining noted. There is a medium amount of purulent drainage noted. There is medium (34-66%) pale, friable granulation within the wound bed. There is a medium (34-66%) amount of necrotic tissue within the wound bed including Eschar and Adherent Slough. The periwound skin appearance exhibited: Excoriation, Maceration, Erythema. The surrounding wound skin color is noted with erythema. 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 There was a Excisional Skin/Subcutaneous Tissue Debridement with a total area of 24.36 sq cm performed by Fredirick Maudlin, MD. With the following instrument(s): Curette to remove Non-Viable tissue/material. Material removed includes Eschar, Subcutaneous Tissue, and Slough after achieving pain control using Lidocaine 5% topical ointment. No specimens were taken. A time out was conducted at 14:20, prior to the start of the procedure. A Minimum amount of bleeding was controlled with Pressure. The procedure was tolerated well with a pain level of 5 throughout and a pain level of 3 following the procedure. Post Debridement Measurements: 5.8cm length x 4.2cm width x 0.1cm depth; 1.913cm^3 volume. Character of Wound/Ulcer Post Debridement is improved. Severity of Tissue Post Debridement is: Fat layer exposed. Post procedure Diagnosis Wound #2: Same as Pre-Procedure General Notes: Scribed for Dr Celine Ahr by Sharyn Creamer, Rn. Wound #3 Pre-procedure diagnosis of Wound #3 is a Venous Leg Ulcer located on the Right,Lateral Ankle .Severity of Tissue Pre Debridement is: Fat layer exposed. There was a Excisional Skin/Subcutaneous Tissue Debridement with a total area of 1.56 sq cm performed by Fredirick Maudlin, MD. With the following instrument(s): Curette to remove Non-Viable tissue/material. Material removed includes Eschar, Subcutaneous Tissue, and Slough after achieving pain control using Lidocaine  5% topical ointment. No specimens were taken. A time out was conducted at 14:20, prior to the start of the procedure. A Minimum amount of bleeding was controlled with Pressure. The procedure was tolerated well with a pain level of 5 throughout and a pain level of 3 following the procedure. Post Debridement Measurements: 1.3cm length x 1.2cm width x 0.1cm depth; 0.123cm^3 volume. Character of Wound/Ulcer Post Debridement is improved. Severity of Tissue Post Debridement is: Fat layer exposed. Post procedure Diagnosis Wound #3: Same as Pre-Procedure General Notes: Scribed for Dr Celine Ahr by Sharyn Creamer, [place], [place] J ([id number]) [id number] Page 9 of 12 Plan Follow-up Appointments: Return Appointment in 1 week. - Dr. Celine Ahr RM 4 Anesthetic: Wound #2 Left,Posterior Lower Leg: (In clinic) Topical Lidocaine 5% applied to wound bed - in clinic, prior to debridement Edema Control - Lymphedema / SCD / Other: Lymphedema Pumps. Use Lymphedema pumps on leg(s) 2-3 times a day for 45-60 minutes. If wearing any wraps or hose, do not remove them. Continue exercising as instructed. - Use x1 per day Avoid standing for long periods of time. Patient to wear own compression stockings every day. - Right lower extremity Moisturize  legs daily. The following medication(s) was prescribed: lidocaine topical 5 % ointment ointment topical once daily for prior to debridement was prescribed at facility WOUND #2: - Lower Leg Wound Laterality: Left, Posterior Cleanser: Soap and Water 1 x Per Week/30 Days Discharge Instructions: May shower and wash wound with dial antibacterial soap and water prior to dressing change. Peri-Wound Care: Sween Lotion (Moisturizing lotion) 1 x Per Week/30 Days Discharge Instructions: Apply moisturizing lotion as directed Topical: Keystone 1 x Per Week/30 Days Discharge Instructions: Apply as directed to wound Prim Dressing: Hydrofera Blue Ready Transfer Foam,  4x5 (in/in) 1 x Per Week/30 Days ary Discharge Instructions: Apply to wound bed as instructed Secondary Dressing: Zetuvit Plus 4x8 in 1 x Per Week/30 Days Discharge Instructions: Apply over primary dressing as directed. Secured With: 39M Medipore Public affairs consultant Surgical T 2x10 (in/yd) 1 x Per Week/30 Days ape Discharge Instructions: Secure with tape as directed. Com pression Wrap: CoFlex TLC XL 2-layer Compression System 4x7 (in/yd) 1 x Per Week/30 Days Discharge Instructions: Apply Calamine CoFlex 2-layer compression as directed. (alt for 4 layer) WOUND #3: - Ankle Wound Laterality: Right, Lateral Cleanser: Soap and Water 1 x Per Week/30 Days Discharge Instructions: May shower and wash wound with dial antibacterial soap and water prior to dressing change. Peri-Wound Care: Sween Lotion (Moisturizing lotion) 1 x Per Week/30 Days Discharge Instructions: Apply moisturizing lotion as directed Topical: Keystone 1 x Per Week/30 Days Discharge Instructions: Apply as directed to wound Prim Dressing: Hydrofera Blue Ready Transfer Foam, 4x5 (in/in) 1 x Per Week/30 Days ary Discharge Instructions: Apply to wound bed as instructed Secondary Dressing: Zetuvit Plus 4x8 in 1 x Per Week/30 Days Discharge Instructions: Apply over primary dressing as directed. Secured With: 39M Medipore Public affairs consultant Surgical T 2x10 (in/yd) 1 x Per Week/30 Days ape Discharge Instructions: Secure with tape as directed. Com pression Wrap: CoFlex TLC XL 2-layer Compression System 4x7 (in/yd) 1 x Per Week/30 Days Discharge Instructions: Apply Calamine CoFlex 2-layer compression as directed. (alt for 4 layer) 11/14/2022: Both wounds have substantial slough and eschar accumulation. They remain extremely tender. I used a curette to debride slough, eschar, and nonviable subcutaneous tissue from her wounds. We will continue to use her Keystone topical antibiotic compound. I am going to use Hydrofera Blue ready foam on both sites with  calamine based Coflex wraps bilaterally. Follow-up in 1 week. Electronic Signature(s) Signed: 11/14/2022 2:38:28 PM By: Fredirick Maudlin MD FACS Entered By: Fredirick Maudlin on 11/14/2022 14:38:27 -------------------------------------------------------------------------------- HxROS Details Patient Name: Date of Service: [name], [name] 11/14/2022 1:45 PM Medical Record Number: [id number] Patient Account Number: [id number] Date of Birth/Sex: Treating RN: 1946-09-25 (77 y.o. F) Primary Care Provider: [name] Other Clinician: Referring Provider: Treating Provider/Extender: [name] Session in Treatment: 15 Information Obtained From Patient [address] 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 Gastrointestinal Medical History: Past Medical History Notes: liver cyst Endocrine Medical History: Negative for: Type I Diabetes; Type II Diabetes Musculoskeletal Medical History: Past Medical History Notes: arthritis Neurologic Medical History: Positive for: Neuropathy - Feet and finger Negative for: Seizure Disorder Past Medical History Notes: restless leg  syndrome Oncologic Medical History: Positive for: Received Chemotherapy - 2012; Received Radiation - 2012 HBO Extended History Items Eyes: Cataracts Immunizations Pneumococcal Vaccine: Received Pneumococcal Vaccination: Yes Received  Pneumococcal Vaccination On or After 60th Birthday: Yes Implantable Devices No devices added Hospitalization / Surgery History Type of Hospitalization/Surgery Lower extremity venography 2020 Intravascular ultrasound peripheral vascular intervention melanoma 2011 AERIAL, DILLEY ([id number]) [id number] Page 51 of 12 Family and Social History Cancer: Yes - Mother,Paternal Grandparents; Diabetes: Yes - Siblings; Heart Disease: Yes - Father; Hypertension: Yes - Father; Former smoker - quit in [employer]) Signed: 11/14/2022 3:50:06 PM By: Fredirick Maudlin MD FACS Entered By: Fredirick Maudlin on 11/14/2022 14:36:01 -------------------------------------------------------------------------------- SuperBill Details Patient Name: Date of Service: [name], LUNZ 11/14/2022 Medical Record Number: [id number] Patient Account Number: [id number] Date of Birth/Sex: Treating RN: 08/23/1946 (77 y.o. F) Primary Care Provider: [name] Other Clinician: Referring Provider: Treating Provider/Extender: [name] Session in Treatment: 15 Diagnosis Coding ICD-10 Codes Code Description [phone] Non-pressure chronic ulcer of other part of left lower leg with fat layer exposed L97.818 Non-pressure chronic ulcer of other part of right lower leg with other specified severity I83.893 Varicose veins of bilateral lower extremities with other complications O24.2 Venous insufficiency (chronic) (peripheral) G62.9 Polyneuropathy, unspecified R60.0 Localized edema L03.115 Cellulitis of right lower limb Facility Procedures : CPT4 Code: [id number] Description: 15400 - DEB SUBQ TISSUE 20 SQ CM/< ICD-10 Diagnosis Description L97.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d L97.818 Non-pressure chronic ulcer of other part of right lower leg with other  specified Modifier: severity Quantity: 1 : CPT4  Code: [id number] Description: 93267 - DEB SUBQ TISS EA ADDL 20CM ICD-10 Diagnosis Description L97.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d L97.818 Non-pressure chronic ulcer of other part of right lower leg with other  specified Modifier: severity Quantity: 1 Physician Procedures : CPT4 Code Description Modifier [id number] 99214 - WC PHYS LEVEL 4 - EST PT 25 ICD-10 Diagnosis Description L97.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d L97.818 Non-pressure chronic ulcer of other part of right  lower leg with other specified severity I87.2 Venous insufficiency (chronic) (peripheral) X83.382 Varicose veins of bilateral lower extremities with other complications Quantity: 1 : [id number] 11042 - WC PHYS SUBQ TISS 20 SQ CM ICD-10 Diagnosis Description L97.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d L97.818 Non-pressure chronic ulcer of other part of right lower leg with other specified  severity Quantity: 1 : [id number] 11045 - WC PHYS SUBQ TISS EA ADDL 20 CM ICD-10 Diagnosis Description L97.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d L97.818 Non-pressure chronic ulcer of other part of right lower leg with other  specified severity TAMBER, BURTCH ([id number]) [id number] P Quantity: 1 age 74 of 56 Electronic Signature(s) Signed: 11/14/2022 2:38:54 PM By: Fredirick Maudlin MD FACS Entered By: Fredirick Maudlin on 11/14/2022 14:38:54

## 2022-11-16 ENCOUNTER — Encounter: Payer: Self-pay | Admitting: Family Medicine

## 2022-11-16 ENCOUNTER — Ambulatory Visit (INDEPENDENT_AMBULATORY_CARE_PROVIDER_SITE_OTHER): Payer: Medicare Other | Admitting: Family Medicine

## 2022-11-16 VITALS — BP 134/78 | HR 73 | Temp 97.7°F | Ht 61.0 in | Wt 199.1 lb

## 2022-11-16 DIAGNOSIS — J069 Acute upper respiratory infection, unspecified: Secondary | ICD-10-CM | POA: Diagnosis not present

## 2022-11-16 MED ORDER — GUAIFENESIN-CODEINE 100-10 MG/5ML PO SYRP: 5.0000 mL | ORAL_SOLUTION | Freq: Two times a day (BID) | ORAL | 0 refills | Status: DC | PRN

## 2022-11-16 NOTE — Progress Notes (Signed)
Patient ID: [name], female    DOB: 04-29-1946, 77 y.o.   MRN: [id number]  This visit was conducted in person.  BP 134/78   Pulse 73   Temp 97.7 F (36.5 C) (Temporal)   Ht '5\' 1"'$  (1.549 m)   Wt 199 lb 2 oz (90.3 kg)   SpO2 96%   BMI 37.62 kg/m    CC: cough, congestion, PNDrainage Subjective:   HPI: [name] is a 77 y.o. female presenting on 11/16/2022 for Follow-up (Here for f/u of cough and nasal drainage. States she is improving. )   10 day history of HA, ST, rhinorrhea, PNDrainage and congestion. Green mucous and dry cough. COVID tested negative at home. ST is better, still with some hoarseness. Still with slight cough. Symptoms slowly better over last 2-3 days. More colored mucous but this has improved as well. Some nasal sinus congestion.   Tried OTC remedies without significant improvement (chloraseptic spray and throat lozenges and tylenol).   Last abx course was 1wk ago (keflex 10d course).  Diarrhea is ongoing (see prior note), pending GI eval. Did not return stool tests today.     Relevant past medical, surgical, family and social history reviewed and updated as indicated. Interim medical history since our last visit reviewed. Allergies and medications reviewed and updated. Outpatient Medications Prior to Visit  Medication Sig Dispense Refill   acetaminophen (TYLENOL) 650 MG CR tablet Take 2 tablets (1,300 mg total) by mouth 2 (two) times daily as needed for pain.     albuterol (VENTOLIN HFA) 108 (90 Base) MCG/ACT inhaler Inhale 1-2 puffs into the lungs every 6 (six) hours as needed for wheezing or shortness of breath. 8.5 each 0   aspirin EC 81 MG tablet Take 81 mg by mouth at bedtime. Melanoma prevention     furosemide (LASIX) 40 MG tablet Take 1 tablet (40 mg total) by mouth daily. Take second dose if needed for leg swelling/weight gain 100 tablet 4   loratadine (CLARITIN) 10 MG tablet Take 10 mg by mouth daily.     losartan (COZAAR) 50 MG tablet Take  1 tablet (50 mg total) by mouth daily. 90 tablet 4   montelukast (SINGULAIR) 10 MG tablet Take 1 tablet (10 mg total) by mouth daily. 90 tablet 4   Multiple Vitamin (MULTIVITAMIN WITH MINERALS) TABS tablet Take 1 tablet by mouth daily.     phenol (CHLORASEPTIC GARGLE) 1.4 % LIQD Use as directed 1 spray in the mouth or throat as needed for throat irritation / pain. 118 mL 0   Polyvinyl Alcohol-Povidone (TEARS PLUS OP) Place 1 drop into both eyes daily as needed (dry eyes/ redness/ burning).      potassium chloride SA (KLOR-CON M20) 20 MEQ tablet Take 1 tablet (20 mEq total) by mouth 2 (two) times daily. 180 tablet 4   PRESCRIPTION MEDICATION CPAP     saccharomyces boulardii (FLORASTOR) 250 MG capsule Take 1 capsule (250 mg total) by mouth 2 (two) times daily.     triamcinolone (NASACORT) 55 MCG/ACT AERO nasal inhaler Place 2 sprays into the nose daily. In each nostril     Vitamin D, Ergocalciferol, (DRISDOL) 1.25 MG (50000 UNIT) CAPS capsule TAKE 1 CAPSULE BY MOUTH ONCE A WEEK 12 capsule 4   cephALEXin (KEFLEX) 500 MG capsule Take 500 mg by mouth 4 (four) times daily.     No facility-administered medications prior to visit.     Per HPI unless specifically indicated in ROS section  below Review of Systems  Objective:  BP 134/78   Pulse 73   Temp 97.7 F (36.5 C) (Temporal)   Ht '5\' 1"'$  (1.549 m)   Wt 199 lb 2 oz (90.3 kg)   SpO2 96%   BMI 37.62 kg/m   Wt Readings from Last 3 Encounters:  11/16/22 199 lb 2 oz (90.3 kg)  10/31/22 202 lb (91.6 kg)  10/24/22 206 lb 6.4 oz (93.6 kg)      Physical Exam Vitals and nursing note reviewed.  Constitutional:      Appearance: Normal appearance. She is not ill-appearing.  HENT:     Head: Normocephalic and atraumatic.     Right Ear: Tympanic membrane, ear canal and external ear normal. There is no impacted cerumen.     Left Ear: Tympanic membrane, ear canal and external ear normal. There is no impacted cerumen.     Nose: Congestion present. No  rhinorrhea.     Right Turbinates: Not enlarged, swollen or pale.     Left Turbinates: Not enlarged, swollen or pale.     Right Sinus: No maxillary sinus tenderness or frontal sinus tenderness.     Left Sinus: No maxillary sinus tenderness or frontal sinus tenderness.     Mouth/Throat:     Mouth: Mucous membranes are moist.     Pharynx: Oropharynx is clear. No oropharyngeal exudate or posterior oropharyngeal erythema.  Eyes:     Extraocular Movements: Extraocular movements intact.     Conjunctiva/sclera: Conjunctivae normal.     Pupils: Pupils are equal, round, and reactive to light.  Cardiovascular:     Rate and Rhythm: Normal rate and regular rhythm.     Pulses: Normal pulses.     Heart sounds: Normal heart sounds. No murmur heard. Pulmonary:     Effort: Pulmonary effort is normal. No respiratory distress.     Breath sounds: Normal breath sounds. No wheezing, rhonchi or rales.  Lymphadenopathy:     Head:     Right side of head: No submental, submandibular, tonsillar, preauricular or posterior auricular adenopathy.     Left side of head: No submental, submandibular, tonsillar, preauricular or posterior auricular adenopathy.     Cervical: No cervical adenopathy.     Right cervical: No superficial cervical adenopathy.    Left cervical: No superficial cervical adenopathy.     Upper Body:     Right upper body: No supraclavicular adenopathy.     Left upper body: No supraclavicular adenopathy.  Skin:    Findings: No rash.  Neurological:     Mental Status: She is alert.  Psychiatric:        Mood and Affect: Mood normal.        Behavior: Behavior normal.       Assessment & Plan:  GI referral placed 10/31/2022 still pending - will ask referral coordinator to check into this  Problem List Items Addressed This Visit     Acute upper respiratory infection - Primary    Anticipate acute sinusitis, viral given symptoms improving over 7-10 days.  Supportive measures reviewed. Rx  cheratussin with sedation precautions. Red flags to seek further treatment reviewed.  Avoid abx at this time with multiple recent rounds of antibiotics.         Meds ordered this encounter  Medications   guaiFENesin-codeine (ROBITUSSIN AC) 100-10 MG/5ML syrup    Sig: Take 5 mLs by mouth 2 (two) times daily as needed for cough (sedation precautions).    Dispense:  100 mL  Refill:  0    No orders of the defined types were placed in this encounter.   Patient Instructions  You have a sinus infection, likely viral.  Take medicine as prescribed: codeine cough syrup  Use nasacort.  Nasal saline irrigation or neti pot to help drain sinuses. May use plain mucinex with plenty of fluid to help mobilize mucous. Push fluids and plenty of rest. Please let us know if fever >101.5, trouble opening/closing mouth, difficulty swallowing, one sided facial pain, or worsening instead of improving as expected.  May try lotrimin antifungal for skin rash along neck and forehead.  Follow up plan: Return if symptoms worsen or fail to improve.  [name], MD

## 2022-11-16 NOTE — Assessment & Plan Note (Signed)
Anticipate acute sinusitis, viral given symptoms improving over 7-10 days.  Supportive measures reviewed. Rx cheratussin with sedation precautions. Red flags to seek further treatment reviewed.  Avoid abx at this time with multiple recent rounds of antibiotics.

## 2022-11-16 NOTE — Patient Instructions (Addendum)
You have a sinus infection, likely viral.  Take medicine as prescribed: codeine cough syrup  Use nasacort.  Nasal saline irrigation or neti pot to help drain sinuses. May use plain mucinex with plenty of fluid to help mobilize mucous. Push fluids and plenty of rest. Please let us know if fever >101.5, trouble opening/closing mouth, difficulty swallowing, one sided facial pain, or worsening instead of improving as expected.  May try lotrimin antifungal for skin rash along neck and forehead.

## 2022-11-21 ENCOUNTER — Encounter (HOSPITAL_BASED_OUTPATIENT_CLINIC_OR_DEPARTMENT_OTHER): Payer: Medicare Other | Admitting: General Surgery

## 2022-11-21 DIAGNOSIS — L97818 Non-pressure chronic ulcer of other part of right lower leg with other specified severity: Secondary | ICD-10-CM | POA: Diagnosis not present

## 2022-11-21 DIAGNOSIS — L97222 Non-pressure chronic ulcer of left calf with fat layer exposed: Secondary | ICD-10-CM | POA: Diagnosis not present

## 2022-11-21 DIAGNOSIS — R600 Localized edema: Secondary | ICD-10-CM | POA: Diagnosis not present

## 2022-11-21 DIAGNOSIS — L97312 Non-pressure chronic ulcer of right ankle with fat layer exposed: Secondary | ICD-10-CM | POA: Diagnosis not present

## 2022-11-21 DIAGNOSIS — G629 Polyneuropathy, unspecified: Secondary | ICD-10-CM | POA: Diagnosis not present

## 2022-11-21 DIAGNOSIS — I83893 Varicose veins of bilateral lower extremities with other complications: Secondary | ICD-10-CM | POA: Diagnosis not present

## 2022-11-21 DIAGNOSIS — L97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d: Secondary | ICD-10-CM | POA: Diagnosis not present

## 2022-11-21 DIAGNOSIS — I872 Venous insufficiency (chronic) (peripheral): Secondary | ICD-10-CM | POA: Diagnosis not present

## 2022-11-21 NOTE — Progress Notes (Signed)
Amber, [name] ([id number]) [id number] Page 1 of 12 Visit Report for 11/21/2022 Chief Complaint Document Details Patient Name: Date of Service: [name], [name] 11/21/2022 2:45 PM Medical Record Number: [id number] Patient Account Number: [id number] Date of Birth/Sex: Treating RN: 24-Nov-1945 (77 y.o. F) Primary Care Provider: [name] Other Clinician: Referring Provider: Treating Provider/Extender: [name] Session in Treatment: 16 Information Obtained from: Patient Chief Complaint Patient presents for treatment of an open ulcer due to venous insufficiency Electronic Signature(s) Signed: 11/21/2022 3:50:52 PM By: Fredirick Maudlin MD FACS Entered By: Fredirick Maudlin on 11/21/2022 15:50:52 -------------------------------------------------------------------------------- Debridement Details Patient Name: Date of Service: [name] 11/21/2022 2:45 PM Medical Record Number: [id number] Patient Account Number: [id number] Date of Birth/Sex: Treating RN: 1946-08-21 (77 y.o. [name] Primary Care Provider: [name] Other Clinician: Referring Provider: Treating Provider/Extender: [name] Session in Treatment: 16 Debridement Performed for Assessment: Wound #3 Right,Lateral Ankle Performed By: Physician Fredirick Maudlin, MD Debridement Type: Debridement Severity of Tissue Pre Debridement: Fat layer exposed Level of Consciousness (Pre-procedure): Awake and Alert Pre-procedure Verification/Time Out Yes - 15:25 Taken: Start Time: 15:25 Pain Control: Lidocaine 5% topical ointment T Area Debrided (L x W): otal 1.1 (cm) x 1.1 (cm) = 1.21 (cm) Tissue and other material debrided: Non-Viable, Slough, Slough Level: Non-Viable Tissue Debridement Description: Selective/Open Wound Instrument: Curette Bleeding: Minimum Hemostasis Achieved: Pressure End Time: 15:26 Procedural Pain: 0 Post  Procedural Pain: 0 Response to Treatment: Procedure was tolerated well Level of Consciousness (Post- Awake and Alert procedure): Post Debridement Measurements of Total Wound Length: (cm) 1.1 Width: (cm) 1.1 Depth: (cm) 0.1 Volume: (cm) 0.095 Character of Wound/Ulcer Post Debridement: Improved Severity of Tissue Post Debridement: Fat layer exposed MICHOL, EMORY ([id number]) [id number] Page 2 of 12 Post Procedure Diagnosis Same as Pre-procedure Notes Scribed for Dr. Celine Ahr by J.Scotton Electronic Signature(s) Signed: 11/21/2022 4:33:07 PM By: Fredirick Maudlin MD FACS Signed: 11/21/2022 4:58:33 PM By: Dellie Catholic RN Entered By: Dellie Catholic on 11/21/2022 15:32:18 -------------------------------------------------------------------------------- Debridement Details Patient Name: Date of Service: Amber, [name] 11/21/2022 2:45 PM Medical Record Number: [id number] Patient Account Number: [id number] Date of Birth/Sex: Treating RN: April 29, 1946 (77 y.o. [name] Primary Care Provider: [name] Other Clinician: Referring Provider: Treating Provider/Extender: [name] Session in Treatment: 16 Debridement Performed for Assessment: Wound #2 Left,Posterior Lower Leg Performed By: Physician Fredirick Maudlin, MD Debridement Type: Debridement Severity of Tissue Pre Debridement: Fat layer exposed Level of Consciousness (Pre-procedure): Awake and Alert Pre-procedure Verification/Time Out Yes - 15:25 Taken: Start Time: 15:25 Pain Control: Lidocaine 5% topical ointment T Area Debrided (L x W): otal 5.5 (cm) x 4.1 (cm) = 22.55 (cm) Tissue and other material debrided: Non-Viable, Slough, Slough Level: Non-Viable Tissue Debridement Description: Selective/Open Wound Instrument: Curette Bleeding: Minimum Hemostasis Achieved: Pressure End Time: 15:26 Procedural Pain: 0 Post Procedural Pain: 0 Response to Treatment:  Procedure was tolerated well Level of Consciousness (Post- Awake and Alert procedure): Post Debridement Measurements of Total Wound Length: (cm) 5.5 Width: (cm) 4.1 Depth: (cm) 0.1 Volume: (cm) 1.771 Character of Wound/Ulcer Post Debridement: Improved Severity of Tissue Post Debridement: Fat layer exposed Post Procedure Diagnosis Same as Pre-procedure Notes Scribed for Dr. Celine Ahr by J.Scotton Electronic Signature(s) Signed: 11/21/2022 4:33:07 PM By: Fredirick Maudlin MD FACS Signed: 11/21/2022 4:58:33 PM By: Dellie Catholic RN Entered By: Dellie Catholic on 11/21/2022 15:33:38 Onder, [name] ([id number]) [id number] Page 3 of 12 -------------------------------------------------------------------------------- HPI Details Patient Name: Date of Service: Amber, [name]. 11/21/2022 2:45  PM Medical Record Number: [id number] Patient Account Number: [id number] Date of Birth/Sex: Treating RN: 1946/08/02 (77 y.o. F) Primary Care Provider: [name] Other Clinician: Referring Provider: Treating Provider/Extender: [name] Session in Treatment: 16 History of Present Illness HPI Description: ADMISSION This is a 77 year old woman with a history of chronic venous insufficiency status post saphenous vein ablations in 2010 and 2016. 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 She presents today with an open venous ulcer on her left lower leg. It has been present for a little over 2 weeks. She saw her primary care provider who apparently swabbed the wound and grew out Pseudomonas. She just completed a course of ciprofloxacin for this. ABI was 0.91. She reports that she has had previous issues with ulcers in this same location, stemming back to a punch biopsy taken by a dermatologist many years ago. She has had several skin substitutes applied to the area that have ultimately resulted in healing on prior occasions. She  has 2 small ulcers on her left medial lower leg. There is slough accumulation in both of them. The more anterior of the 2 is quite small and has some soft slough on the surface, underneath which there is good granulation tissue. The more medial wound also has slough accumulation, but the underlying surface is fairly fibrotic and gritty. This is consistent with her provided history of multiple ulcers in the same location. 08/03/2022: The anterior wound is smaller today with just a little bit of slough accumulation. The more medial wound continues to be very sensitive and fibrotic with slough buildup. 08/13/2022: The anterior wound is closed. The more medial wound remains sensitive with a fairly fibrotic surface. There is more granulation tissue filling in, however, and there is less slough than on prior occasions. 08/20/2022: The anterior wound remains closed. The more medial wound has filled with granulation tissue but still has a fair amount of slough on the surface and remains fairly tender. Unfortunately, she has developed 2 small ulcers just proximal to this. The fat layer is exposed in each. She says that over the weekend, she felt a burning sensation in that location. 08/27/2022: The 2 small wounds proximal to the main wound have merged into a single site. The wounds look a little bit dry, but they are quite clean without significant slough accumulation. They remain quite tender. 09/03/2022: All of the wounds have now merged into 1 large wound. There is a strong odor coming from the wound and the surface is not particularly viable. It is extremely painful for her today. 09/10/2022: The wound is less black and purple this week but still does not look particularly viable. The surface is desiccated. The culture that I took last week returned with a polymicrobial population including Pseudomonas. I prescribed both Augmentin and levofloxacin. She has not yet picked up levofloxacin, as her pharmacy only  notified her yesterday that it was ready. We have ordered a Keystone topical compounded antibiotic, but it has not yet come. 09/17/2022: The wound surface is markedly improved today. There is still an area of grayish-looking muscle but the rest appears significantly more viable. There is still a layer of slough on the surface. She still has a couple days left of oral antibiotic therapy. She has her [employer] compounded topical antibiotic with her today. 09/24/2022: She has completed her oral antibiotic therapy. The wound surface is much cleaner today and more viable without any necrotic tissue. It is a bit desiccated, however. 10/01/2022:  The moisture balance of the wound has improved. There is a layer of yellow slough on the surface, but beneath this, the wound is more pink. 10/08/2022: The wound is smaller and cleaner today with a layer of slough present. She is having less pain. 12/18; this patient has a new wound on the right lateral ankle which apparently started after a cat scratched her. Secondarily she managed to drop a cake pan on the same area. This is very painful. The original wound was on the left posterior calf we have been using [employer] under an [employer]. The Unna boot fell down and apparently she was in for a nurse visit perhaps last week and that 1 fell down as well This patient has central venous mediated hypertension. For member correctly she has a stent placed in the left common iliac artery by Dr. Donzetta Matters some years ago [facility] Thurner syndrome] she also has had venous ablations and I believe a history of a DVT The patient has compression pumps at home but she does not use them 10/30/2022: The patient says that her wounds burned all week with the [facility] classic in place. She has a fair amount of slough and nonviable subcutaneous tissue present on the right ankle. The left posterior calf wound is cleaner than I have seen it to date. Both remain fairly tender. 11/14/2022:  Both wounds have substantial slough and eschar accumulation. They remain extremely tender. 11/21/2022: The wounds are cleaner this week, but still have slough and eschar accumulation. She continues to describe burning pain in both wounds, but upon further questioning, she actually has burning in both of her feet that radiates up her legs and includes to the wounds. Electronic Signature(s) Signed: 11/21/2022 3:51:43 PM By: Fredirick Maudlin MD FACS Entered By: Fredirick Maudlin on 11/21/2022 15:51:43 [name] ([id number]) [id number] Page 4 of 12 -------------------------------------------------------------------------------- Physical Exam Details Patient Name: Date of Service: CALYSTA, CRAIGO 11/21/2022 2:45 PM Medical Record Number: [id number] Patient Account Number: [id number] Date of Birth/Sex: Treating RN: [birth year] (77 y.o. F) Primary Care Provider: [name] Other Clinician: Referring Provider: Treating Provider/Extender: [name] Session in Treatment: 16 Constitutional Hypertensive, asymptomatic. . . . no acute distress. Respiratory Normal work of breathing on room air. Notes 11/21/2022: The wounds are cleaner this week, but still have slough and eschar accumulation. She continues to describe burning pain in both wounds, but upon further questioning, she actually has burning in both of her feet that radiates up her legs and includes to the wounds. Electronic Signature(s) Signed: 11/21/2022 3:52:46 PM By: Fredirick Maudlin MD FACS Entered By: Fredirick Maudlin on 11/21/2022 15:52:46 -------------------------------------------------------------------------------- Physician Orders Details Patient Name: Date of Service: MEGHNA, HAGMANN 11/21/2022 2:45 PM Medical Record Number: [id number] Patient Account Number: [id number] Date of Birth/Sex: Treating RN: 12/03/1945 (77 y.o. [name] Primary Care Provider: [name] Other Clinician: Referring Provider: Treating Provider/Extender: [name] Session in Treatment: [phone] Verbal / Phone Orders: No Diagnosis Coding ICD-10 Coding Code Description [phone] Non-pressure chronic ulcer of other part of left lower leg with fat layer exposed L97.818 Non-pressure chronic ulcer of other part of right lower leg with other specified severity I83.893 Varicose veins of bilateral lower extremities with other complications Z32.9 Venous insufficiency (chronic) (peripheral) G62.9 Polyneuropathy, unspecified R60.0 Localized edema L03.115 Cellulitis of right lower limb Follow-up Appointments ppointment in 1 week. - Dr. Celine Ahr RM 4 Return A Anesthetic Wound #2 Left,Posterior Lower Leg (In clinic) Topical Lidocaine 5% applied to  wound bed - in clinic, prior to debridement Edema Control - Lymphedema / SCD / Other Left Lower Extremity Lymphedema Pumps. Use Lymphedema pumps on leg(s) 2-3 times a day for 45-60 minutes. If wearing any wraps or hose, do not remove them. Continue exercising as instructed. - Use x1 per day Avoid standing for long periods of time. Patient to wear own compression stockings every day. - Right lower extremity RHYLEIGH, GRASSEL ([id number]) [id number] Page 5 of 12 Moisturize legs daily. Wound Treatment Wound #2 - Lower Leg Wound Laterality: Left, Posterior Cleanser: Soap and Water 1 x Per Week/30 Days Discharge Instructions: May shower and wash wound with dial antibacterial soap and water prior to dressing change. Peri-Wound Care: Sween Lotion (Moisturizing lotion) 1 x Per Week/30 Days Discharge Instructions: Apply moisturizing lotion as directed Topical: Keystone 1 x Per Week/30 Days Discharge Instructions: Apply as directed to wound Prim Dressing: Sorbalgon AG Dressing, 4x4 (in/in) 1 x Per Week/30 Days ary Discharge Instructions: Apply to wound bed as instructed Secondary Dressing: Zetuvit  Plus 4x8 in 1 x Per Week/30 Days Discharge Instructions: Apply over primary dressing as directed. Secured With: 17M Medipore Public affairs consultant Surgical T 2x10 (in/yd) 1 x Per Week/30 Days ape Discharge Instructions: Secure with tape as directed. Compression Wrap: CoFlex TLC XL 2-layer Compression System 4x7 (in/yd) 1 x Per Week/30 Days Discharge Instructions: Apply Calamine CoFlex 2-layer compression as directed. (alt for 4 layer) Wound #3 - Ankle Wound Laterality: Right, Lateral Cleanser: Soap and Water 1 x Per Week/30 Days Discharge Instructions: May shower and wash wound with dial antibacterial soap and water prior to dressing change. Peri-Wound Care: Sween Lotion (Moisturizing lotion) 1 x Per Week/30 Days Discharge Instructions: Apply moisturizing lotion as directed Topical: Keystone 1 x Per Week/30 Days Discharge Instructions: Apply as directed to wound Prim Dressing: Sorbalgon AG Dressing 2x2 (in/in) 1 x Per Week/30 Days ary Discharge Instructions: Apply to wound bed as instructed Secondary Dressing: Zetuvit Plus 4x8 in 1 x Per Week/30 Days Discharge Instructions: Apply over primary dressing as directed. Secured With: 17M Medipore Public affairs consultant Surgical T 2x10 (in/yd) 1 x Per Week/30 Days ape Discharge Instructions: Secure with tape as directed. Compression Wrap: CoFlex TLC XL 2-layer Compression System 4x7 (in/yd) 1 x Per Week/30 Days Discharge Instructions: Apply Calamine CoFlex 2-layer compression as directed. (alt for 4 layer) Electronic Signature(s) Signed: 11/21/2022 4:33:07 PM By: Fredirick Maudlin MD FACS Entered By: Fredirick Maudlin on 11/21/2022 15:52:58 -------------------------------------------------------------------------------- Problem List Details Patient Name: Date of Service: HYUN, REALI 11/21/2022 2:45 PM Medical Record Number: [id number] Patient Account Number: [id number] Date of Birth/Sex: Treating RN: 08/07/1946 (77 y.o. F) Primary Care Provider: [name]  Other Clinician: Referring Provider: Treating Provider/Extender: [name] Session in Treatment: 16 Active Problems ICD-10 Encounter Code Description Active Date MDM Diagnosis SCHAE, CANDO ([id number]) [id number] Page 6 of 12 [phone] Non-pressure chronic ulcer of other part of left lower leg with fat layer exposed9/22/2023 No Yes L97.818 Non-pressure chronic ulcer of other part of right lower leg with other specified 10/22/2022 No Yes severity I83.893 Varicose veins of bilateral lower extremities with other complications 6/59/9357 No Yes I87.2 Venous insufficiency (chronic) (peripheral) 07/27/2022 No Yes G62.9 Polyneuropathy, unspecified 07/27/2022 No Yes R60.0 Localized edema 07/27/2022 No Yes L03.115 Cellulitis of right lower limb 10/22/2022 No Yes Inactive Problems Resolved Problems Electronic Signature(s) Signed: 11/21/2022 3:50:16 PM By: Fredirick Maudlin MD FACS Entered By: Fredirick Maudlin on 11/21/2022 15:50:15 -------------------------------------------------------------------------------- Progress Note Details Patient Name: Date of Service: [name]  J. 11/21/2022 2:45 PM Medical Record Number: [id number] Patient Account Number: [id number] Date of Birth/Sex: Treating RN: 09/12/1946 (77 y.o. F) Primary Care Provider: [name] Other Clinician: Referring Provider: Treating Provider/Extender: [name] Session in Treatment: 16 Subjective Chief Complaint Information obtained from Patient Patient presents for treatment of an open ulcer due to venous insufficiency History of Present Illness (HPI) ADMISSION This is a 77 year old woman with a history of chronic venous insufficiency status post saphenous vein ablations in 2010 and 2016. 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 She presents today with an open venous ulcer on her left lower leg. It has been present for  a little over 2 weeks. She saw her primary care provider who apparently swabbed the wound and grew out Pseudomonas. She just completed a course of ciprofloxacin for this. ABI was 0.91. She reports that she has had previous issues with ulcers in this same location, stemming back to a punch biopsy taken by a dermatologist many years ago. She has had several skin substitutes applied to the area that have ultimately resulted in healing on prior occasions. She has 2 small ulcers on her left medial lower leg. There is slough accumulation in both of them. The more anterior of the 2 is quite small and has some soft slough on the surface, underneath which there is good granulation tissue. The more medial wound also has slough accumulation, but the underlying surface is fairly fibrotic and gritty. This is consistent with her provided history of multiple ulcers in the same location. 08/03/2022: The anterior wound is smaller today with just a little bit of slough accumulation. The more medial wound continues to be very sensitive and fibrotic with slough buildup. 08/13/2022: The anterior wound is closed. The more medial wound remains sensitive with a fairly fibrotic surface. There is more granulation tissue filling in, TYNEKA, SCAFIDI ([id number]) [phone].pdf Page 7 of 12 however, and there is less slough than on prior occasions. 08/20/2022: The anterior wound remains closed. The more medial wound has filled with granulation tissue but still has a fair amount of slough on the surface and remains fairly tender. Unfortunately, she has developed 2 small ulcers just proximal to this. The fat layer is exposed in each. She says that over the weekend, she felt a burning sensation in that location. 08/27/2022: The 2 small wounds proximal to the main wound have merged into a single site. The wounds look a little bit dry, but they are quite clean without significant slough accumulation. They remain  quite tender. 09/03/2022: All of the wounds have now merged into 1 large wound. There is a strong odor coming from the wound and the surface is not particularly viable. It is extremely painful for her today. 09/10/2022: The wound is less black and purple this week but still does not look particularly viable. The surface is desiccated. The culture that I took last week returned with a polymicrobial population including Pseudomonas. I prescribed both Augmentin and levofloxacin. She has not yet picked up levofloxacin, as her pharmacy only notified her yesterday that it was ready. We have ordered a Keystone topical compounded antibiotic, but it has not yet come. 09/17/2022: The wound surface is markedly improved today. There is still an area of grayish-looking muscle but the rest appears significantly more viable. There is still a layer of slough on the surface. She still has a couple days left of oral antibiotic therapy. She has her [employer] compounded topical antibiotic with her  today. 09/24/2022: She has completed her oral antibiotic therapy. The wound surface is much cleaner today and more viable without any necrotic tissue. It is a bit desiccated, however. 10/01/2022: The moisture balance of the wound has improved. There is a layer of yellow slough on the surface, but beneath this, the wound is more pink. 10/08/2022: The wound is smaller and cleaner today with a layer of slough present. She is having less pain. 12/18; this patient has a new wound on the right lateral ankle which apparently started after a cat scratched her. Secondarily she managed to drop a cake pan on the same area. This is very painful. The original wound was on the left posterior calf we have been using [employer] under an [employer]. The Unna boot fell down and apparently she was in for a nurse visit perhaps last week and that 1 fell down as well This patient has central venous mediated hypertension. For member correctly she  has a stent placed in the left common iliac artery by Dr. Donzetta Matters some years ago [facility] Thurner syndrome] she also has had venous ablations and I believe a history of a DVT The patient has compression pumps at home but she does not use them 10/30/2022: The patient says that her wounds burned all week with the [facility] classic in place. She has a fair amount of slough and nonviable subcutaneous tissue present on the right ankle. The left posterior calf wound is cleaner than I have seen it to date. Both remain fairly tender. 11/14/2022: Both wounds have substantial slough and eschar accumulation. They remain extremely tender. 11/21/2022: The wounds are cleaner this week, but still have slough and eschar accumulation. She continues to describe burning pain in both wounds, but upon further questioning, she actually has burning in both of her feet that radiates up her legs and includes to the wounds. Patient History Information obtained from Patient. Family History Cancer - Mother,Paternal Grandparents, Diabetes - Siblings, Heart Disease - Father, Hypertension - Father. Social History Former smoker - quit in 1967. Medical History Eyes Patient has history of Cataracts - L eye Ear/Nose/Mouth/Throat Denies history of Chronic sinus problems/congestion, Middle ear problems Hematologic/Lymphatic Denies history of Anemia, Human Immunodeficiency Virus, Lymphedema Respiratory Patient has history of Asthma Cardiovascular Patient has history of Hypertension Endocrine Denies history of Type I Diabetes, Type II Diabetes Neurologic Patient has history of Neuropathy - Feet and finger Denies history of Seizure Disorder Oncologic Patient has history of Received Chemotherapy - 2012, Received Radiation - 2012 Hospitalization/Surgery History - Lower extremity venography 2020. - Intravascular ultrasound. - peripheral vascular intervention. - melanoma 2011. Medical A Surgical History Notes nd Respiratory OSA  on CPAP Cardiovascular Cronic venous insufficiency 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 Gastrointestinal liver cyst Musculoskeletal arthritis Neurologic restless leg syndrome TAYLEE, GUNNELLS ([id number]) [id number] Page 8 of 12 Objective Constitutional Hypertensive, asymptomatic. no acute distress. Vitals Time Taken: 3:00 PM, Height: 63 in, Weight: 200 lbs, BMI: 35.4, Temperature: 98 F, Pulse: 84 bpm, Respiratory Rate: 18 breaths/min, Blood Pressure: 170/83 mmHg. Respiratory Normal work of breathing on room air. General Notes: 11/21/2022: The wounds are cleaner this week, but still have slough and eschar accumulation. She continues to describe burning pain in both wounds, but upon further questioning, she actually has burning in both of her feet that radiates up her legs and includes to the wounds. Integumentary (Hair, Skin) Wound #2 status is Open. Original cause of wound was Blister. The date acquired was: 07/06/2022. The  wound has been in treatment 16 weeks. The wound is located on the Left,Posterior Lower Leg. The wound measures 5.5cm length x 4.1cm width x 0.1cm depth; 17.711cm^2 area and 1.771cm^3 volume. There is Fat Layer (Subcutaneous Tissue) exposed. There is no tunneling or undermining noted. There is a medium amount of serosanguineous drainage noted. The wound margin is flat and intact. There is large (67-100%) red, friable granulation within the wound bed. There is a small (1-33%) amount of necrotic tissue within the wound bed including Eschar and Adherent Slough. The periwound skin appearance had no abnormalities noted for color. The periwound skin appearance did not exhibit: Excoriation, Maceration. The periwound has tenderness on palpation. Wound #3 status is Open. Original cause of wound was Contusion/Bruise. The date acquired was: 10/19/2022. The wound has been in treatment 4 weeks. The wound is located on the  Right,Lateral Ankle. The wound measures 1.1cm length x 1.1cm width x 0.1cm depth; 0.95cm^2 area and 0.095cm^3 volume. There is no tunneling or undermining noted. There is a medium amount of purulent drainage noted. There is medium (34-66%) pale, friable granulation within the wound bed. There is a medium (34-66%) amount of necrotic tissue within the wound bed including Eschar and Adherent Slough. The periwound skin appearance exhibited: Excoriation, Maceration, Erythema. The surrounding wound skin color is noted with erythema. 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 There was a Selective/Open Wound Non-Viable Tissue Debridement with a total area of 22.55 sq cm performed by Fredirick Maudlin, MD. With the following instrument(s): Curette to remove Non-Viable tissue/material. Material removed includes [facility] after achieving pain control using Lidocaine 5% topical ointment. No specimens were taken. A time out was conducted at 15:25, prior to the start of the procedure. A Minimum amount of bleeding was controlled with Pressure. The procedure was tolerated well with a pain level of 0 throughout and a pain level of 0 following the procedure. Post Debridement Measurements: 5.5cm length x 4.1cm width x 0.1cm depth; 1.771cm^3 volume. Character of Wound/Ulcer Post Debridement is improved. Severity of Tissue Post Debridement is: Fat layer exposed. Post procedure Diagnosis Wound #2:  Same as Pre-Procedure General Notes: Scribed for Dr. Celine Ahr by J.Scotton. Pre-procedure diagnosis of Wound #2 is a Venous Leg Ulcer located on the Left,Posterior Lower Leg . There was a Double Layer Compression Therapy Procedure by Dellie Catholic, RN. Post procedure Diagnosis Wound #2: Same as Pre-Procedure Notes: with Calamine as 1st layer. Wound #3 Pre-procedure diagnosis of Wound #3 is a Venous Leg Ulcer located on the Right,Lateral Ankle .Severity of Tissue Pre Debridement is: Fat layer exposed. There was a Selective/Open Wound Non-Viable Tissue Debridement with a total area of 1.21 sq cm performed by Fredirick Maudlin, MD. With the following instrument(s): Curette to remove Non-Viable tissue/material. Material removed includes [facility] after achieving pain control using Lidocaine 5% topical ointment. No specimens were taken. A time out was conducted at 15:25, prior to the start of the procedure. A Minimum amount of bleeding was controlled with Pressure. The procedure was tolerated well with a pain level of 0 throughout and a pain  level of 0 following the procedure. Post Debridement Measurements: 1.1cm length x 1.1cm width x 0.1cm depth; 0.095cm^3 volume. Character of Wound/Ulcer Post Debridement is improved. Severity of Tissue Post Debridement is: Fat layer exposed. Post procedure Diagnosis Wound #3: Same as Pre-Procedure General Notes: Scribed for Dr. Celine Ahr by J.Scotton. NIYANNA, ASCH ([id number]) [id number] Page 9 of 12 Plan Follow-up Appointments: Return Appointment in 1 week. - Dr. Celine Ahr RM 4 Anesthetic: Wound #2 Left,Posterior Lower Leg: (In clinic) Topical Lidocaine 5% applied to wound bed - in clinic, prior to debridement Edema Control - Lymphedema / SCD / Other: Lymphedema Pumps. Use Lymphedema pumps on leg(s) 2-3 times a day for 45-60 minutes. If wearing any wraps or hose, do not remove them. Continue exercising as instructed. - Use x1 per  day Avoid standing for long periods of time. Patient to wear own compression stockings every day. - Right lower extremity Moisturize legs daily. WOUND #2: - Lower Leg Wound Laterality: Left, Posterior Cleanser: Soap and Water 1 x Per Week/30 Days Discharge Instructions: May shower and wash wound with dial antibacterial soap and water prior to dressing change. Peri-Wound Care: Sween Lotion (Moisturizing lotion) 1 x Per Week/30 Days Discharge Instructions: Apply moisturizing lotion as directed Topical: Keystone 1 x Per Week/30 Days Discharge Instructions: Apply as directed to wound Prim Dressing: Sorbalgon AG Dressing, 4x4 (in/in) 1 x Per Week/30 Days ary Discharge Instructions: Apply to wound bed as instructed Secondary Dressing: Zetuvit Plus 4x8 in 1 x Per Week/30 Days Discharge Instructions: Apply over primary dressing as directed. Secured With: 46M Medipore Public affairs consultant Surgical T 2x10 (in/yd) 1 x Per Week/30 Days ape Discharge Instructions: Secure with tape as directed. Com pression Wrap: CoFlex TLC XL 2-layer Compression System 4x7 (in/yd) 1 x Per Week/30 Days Discharge Instructions: Apply Calamine CoFlex 2-layer compression as directed. (alt for 4 layer) WOUND #3: - Ankle Wound Laterality: Right, Lateral Cleanser: Soap and Water 1 x Per Week/30 Days Discharge Instructions: May shower and wash wound with dial antibacterial soap and water prior to dressing change. Peri-Wound Care: Sween Lotion (Moisturizing lotion) 1 x Per Week/30 Days Discharge Instructions: Apply moisturizing lotion as directed Topical: Keystone 1 x Per Week/30 Days Discharge Instructions: Apply as directed to wound Prim Dressing: Sorbalgon AG Dressing 2x2 (in/in) 1 x Per Week/30 Days ary Discharge Instructions: Apply to wound bed as instructed Secondary Dressing: Zetuvit Plus 4x8 in 1 x Per Week/30 Days Discharge Instructions: Apply over primary dressing as directed. Secured With: 46M Medipore Public affairs consultant Surgical T  2x10 (in/yd) 1 x Per Week/30 Days ape Discharge Instructions: Secure with tape as directed. Com pression Wrap: CoFlex TLC XL 2-layer Compression System 4x7 (in/yd) 1 x Per Week/30 Days Discharge Instructions: Apply Calamine CoFlex 2-layer compression as directed. (alt for 4 layer) 11/21/2022: The wounds are cleaner this week, but still have slough and eschar accumulation. She continues to describe burning pain in both wounds, but upon further questioning, she actually has burning in both of her feet that radiates up her legs and includes to the wounds. I used a curette to debride slough and eschar from both of her wounds. This was extremely poorly tolerated by the patient secondary to pain. Based on her description, I think it is highly likely that she has neuropathy and might benefit from a medication such as pregabalin or gabapentin. I have asked her to discuss this with her primary care provider. She may be better able to tolerate debridement with 1 of these medications on board which  would certainly accelerate her healing process. Due to the fact that the [facility] has been sticking to her wounds, I am going to change that to silver alginate. We will continue to use her Keystone topical antibiotic compound on her wounds with bilateral calamine-based Coflex. Follow-up in 1 week. Electronic Signature(s) Signed: 11/21/2022 4:02:19 PM By: Fredirick Maudlin MD FACS Previous Signature: 11/21/2022 3:53:11 PM Version By: Fredirick Maudlin MD FACS Entered By: Fredirick Maudlin on 11/21/2022 16:02:18 -------------------------------------------------------------------------------- HxROS Details Patient Name: Date of Service: GURTHA, PICKER 11/21/2022 2:45 PM Medical Record Number: [id number] Patient Account Number: [id number] Date of Birth/Sex: Treating RN: [birth year] (77 y.o. F) Primary Care Provider: [name] Other Clinician: Referring Provider: Treating Provider/Extender: [name] Session in Treatment: [place] From CIPRIANA, BILLER ([id number]) [id number] Page 10 of 12 Patient Eyes Medical History: Positive for: Cataracts - L eye Ear/Nose/Mouth/Throat Medical History: Negative for: Chronic sinus problems/congestion; Middle ear problems Hematologic/Lymphatic Medical History: Negative for: Anemia; Human Immunodeficiency Virus; Lymphedema Respiratory Medical History: Positive for: Asthma Past Medical History Notes: OSA on CPAP Cardiovascular Medical History: Positive for: Hypertension Past Medical History Notes: Cronic venous insufficiency 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 Gastrointestinal Medical History: Past Medical History Notes: liver cyst Endocrine Medical History: Negative for: Type I Diabetes; Type II Diabetes Musculoskeletal Medical History: Past Medical History Notes: arthritis Neurologic Medical History: Positive for: Neuropathy - Feet and finger Negative for: Seizure Disorder Past Medical History Notes: restless leg syndrome Oncologic Medical History: Positive for: Received Chemotherapy - 2012; Received Radiation - 2012 HBO Extended History Items Eyes: Cataracts Immunizations Pneumococcal Vaccine: Received Pneumococcal Vaccination: Yes Received Pneumococcal Vaccination On or After 60th Birthday: Yes Implantable Devices No devices added Hospitalization / Surgery History Type of Hospitalization/Surgery Lower extremity venography 2020 IOLA, TURRI ([id number]) [id number] Page 11 of 12 Intravascular ultrasound peripheral vascular intervention melanoma 2011 Family and Social History Cancer: Yes - Mother,Paternal Grandparents; Diabetes: Yes - Siblings; Heart Disease: Yes - Father; Hypertension: Yes - Father; Former smoker - quit in [employer]) Signed: 11/21/2022 4:33:07 PM By: Fredirick Maudlin MD FACS Entered By: Fredirick Maudlin on 11/21/2022 15:51:49 -------------------------------------------------------------------------------- SuperBill Details Patient Name: Date of Service: SAYDE, LISH 11/21/2022 Medical Record Number: [id number] Patient Account Number: [id number] Date of Birth/Sex: Treating RN: [birth year] (77 y.o. F) Primary Care Provider: [name] Other Clinician: Referring Provider: Treating Provider/Extender: [name] Session in Treatment: 16 Diagnosis Coding ICD-10 Codes Code Description [id number] Non-pressure chronic ulcer of other part of left lower leg with fat layer exposed L97.818 Non-pressure chronic ulcer of other part of right lower leg with other specified severity I83.893 Varicose veins of bilateral lower extremities with other complications Z16.9 Venous insufficiency (chronic) (peripheral) G62.9 Polyneuropathy, unspecified R60.0 Localized edema L03.115 Cellulitis of right lower limb Facility Procedures : CPT4 Code: [id number] Description: 17510 - DEBRIDE WOUND 1ST 20 SQ CM OR < ICD-10 Diagnosis Description L97.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d L97.818 Non-pressure chronic ulcer of other part of right lower leg with other  specified s Modifier: everity Quantity: 1 : CPT4 Code: [id number] Description: 24235 - DEBRIDE WOUND EA ADDL 20 SQ CM ICD-10 Diagnosis Description L97.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d L97.818 Non-pressure chronic ulcer of other part of right lower leg with other  specified s Modifier: everity Quantity: 1 Physician Procedures : CPT4 Code Description Modifier [id number] - WC PHYS  LEVEL 4 - EST PT 25 ICD-10 Diagnosis Description L97.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d L97.818 Non-pressure chronic ulcer of other part of right  lower leg  with other specified severity I87.2 Venous insufficiency (chronic) (peripheral) G62.9 Polyneuropathy, unspecified Quantity: 1 : [id number] 97597 - WC PHYS DEBR WO ANESTH 20 SQ CM ICD-10 Diagnosis Description L97.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d L97.818 Non-pressure chronic ulcer of other part of right lower leg with other  specified severity Quantity: 1 : [id number] 97598 - WC PHYS DEBR WO ANESTH EA ADD 20 CM TAMINA, CYPHERS ([id number]) [id number] ICD-10 Diagnosis Description L97.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d L97.818  Non-pressure chronic ulcer of other part of right lower leg with other specified severity Quantity: 1 7.pdf Page 12 of 12 Electronic Signature(s) Signed: 11/21/2022 4:02:48 PM By: Fredirick Maudlin MD FACS Entered By: Fredirick Maudlin on 11/21/2022 16:02:47

## 2022-11-21 NOTE — Progress Notes (Signed)
[name], [name] ([id number]) [id number] Page 1 of 9 Visit Report for 11/21/2022 Arrival Information Details Patient Name: Date of Service: [name], [name] 11/21/2022 2:45 PM Medical Record Number: [id number] Patient Account Number: [id number] Date of Birth/Sex: Treating RN: 26-Jul-1946 (77 y.o. [name] Primary Care Nailani Full: [name] Other Clinician: Referring Naja Apperson: Treating Matai Carpenito/Extender: [name] Session in Treatment: 16 Visit Information History Since Last Visit Added or deleted any medications: No Patient Arrived: Ambulatory Any new allergies or adverse reactions: No Arrival Time: 15:00 Had a fall or experienced change in No Accompanied By: self activities of daily living that may affect Transfer Assistance: Manual risk of falls: Patient Identification Verified: Yes Signs or symptoms of abuse/neglect since last visito No Patient Requires Transmission-Based Precautions: No Hospitalized since last visit: No Patient Has Alerts: No Implantable device outside of the clinic excluding No cellular tissue based products placed in the center since last visit: Has Dressing in Place as Prescribed: Yes Has Compression in Place as Prescribed: Yes Pain Present Now: Yes Electronic Signature(s) Signed: 11/21/2022 4:58:33 PM By: Dellie Catholic RN Entered By: Dellie Catholic on 11/21/2022 15:23:39 -------------------------------------------------------------------------------- Compression Therapy Details Patient Name: Date of Service: [name], [name] 11/21/2022 2:45 PM Medical Record Number: [id number] Patient Account Number: [id number] Date of Birth/Sex: Treating RN: 10-Sep-1946 (77 y.o. [name] Primary Care Heraclio Seidman: [name] Other Clinician: Referring Aryav Wimberly: Treating Lynise Porr/Extender: [name] Session in Treatment: 16 Compression Therapy Performed for Wound  Assessment: Wound #2 Left,Posterior Lower Leg Performed By: Clinician Dellie Catholic, RN Compression Type: Double Layer Post Procedure Diagnosis Same as Pre-procedure Notes with Calamine as 1st layer Electronic Signature(s) Signed: 11/21/2022 4:58:33 PM By: Dellie Catholic RN Entered By: Dellie Catholic on 11/21/2022 15:35:41 [name] ([id number]) [id number] Page 2 of 9 -------------------------------------------------------------------------------- Encounter Discharge Information Details Patient Name: Date of Service: [name], [name] 11/21/2022 2:45 PM Medical Record Number: [id number] Patient Account Number: [id number] Date of Birth/Sex: Treating RN: 05/13/1946 (77 y.o. [name] Primary Care Maks Cavallero: [name] Other Clinician: Referring Anyely Cunning: Treating Mekhi Sonn/Extender: [name] Session in Treatment: 16 Encounter Discharge Information Items Post Procedure Vitals Discharge Condition: Stable Temperature (F): 98 Ambulatory Status: Ambulatory Pulse (bpm): 84 Discharge Destination: Home Respiratory Rate (breaths/min): 18 Transportation: Private Auto Blood Pressure (mmHg): 170/83 Accompanied By: self Schedule Follow-up Appointment: Yes Clinical Summary of Care: Patient Declined Electronic Signature(s) Signed: 11/21/2022 4:58:33 PM By: Dellie Catholic RN Entered By: Dellie Catholic on 11/21/2022 16:49:47 -------------------------------------------------------------------------------- Lower Extremity Assessment Details Patient Name: Date of Service: [name], [name] 11/21/2022 2:45 PM Medical Record Number: [id number] Patient Account Number: [id number] Date of Birth/Sex: Treating RN: 06/21/1946 (77 y.o. [name] Primary Care Autry Droege: [name] Other Clinician: Referring Ever Gustafson: Treating Vedika Dumlao/Extender: Pixie Casino Weeks in Treatment: 16 Edema  Assessment Assessed: [name]: No] Patrice Paradise: No] [Left: Edema] [Right: :] Calf Left: Right: Point of Measurement: From Medial Instep 51.5 cm 49 cm Ankle Left: Right: Point of Measurement: From Medial Instep 23.7 cm 23.6 cm Vascular Assessment Pulses: Dorsalis Pedis Palpable: [Left:Yes] [Right:Yes] Electronic Signature(s) Signed: 11/21/2022 4:58:33 PM By: Dellie Catholic RN Entered By: Dellie Catholic on 11/21/2022 15:25:22 [name] ([id number]) [id number] Page 3 of 9 -------------------------------------------------------------------------------- Multi Wound Chart Details Patient Name: Date of Service: [name], [name] 11/21/2022 2:45 PM Medical Record Number: [id number] Patient Account Number: [id number] Date of Birth/Sex: Treating RN: 1946/08/04 (77 y.o. F) Primary Care Aiva Miskell: [name] Other Clinician: Referring Hollee Fate: Treating Dashawn Golda/Extender: [name] Session  in Treatment: 16 Vital Signs Height(in): 63 Pulse(bpm): 84 Weight(lbs): 200 Blood Pressure(mmHg): 170/83 Body Mass Index(BMI): 35.4 Temperature(F): 98 Respiratory Rate(breaths/min): 18 [2:Photos:] [N/A:N/A] Left, Posterior Lower Leg Right, Lateral Ankle N/A Wound Location: Blister Contusion/Bruise N/A Wounding Event: Venous Leg Ulcer Venous Leg Ulcer N/A Primary Etiology: Cataracts, Asthma, Hypertension, Cataracts, Asthma, Hypertension, N/A Comorbid History: Neuropathy, Received Chemotherapy, Neuropathy, Received Chemotherapy, Received Radiation Received Radiation 07/06/2022 10/19/2022 N/A Date Acquired: 16 4 N/A Weeks of Treatment: Open Open N/A Wound Status: No No N/A Wound Recurrence: 5.5x4.1x0.1 1.1x1.1x0.1 N/A Measurements L x W x D (cm) 17.711 0.95 N/A A (cm) : rea 1.771 0.095 N/A Volume (cm) : -157.70% 64.40% N/A % Reduction in A rea: -157.80% 64.40% N/A % Reduction in Volume: Full Thickness Without Exposed Full  Thickness Without Exposed N/A Classification: Support Structures Support Structures Medium Medium N/A Exudate A mount: Serosanguineous Purulent N/A Exudate Type: red, brown yellow, brown, green N/A Exudate Color: Flat and Intact N/A N/A Wound Margin: Large (67-100%) Medium (34-66%) N/A Granulation A mount: Red, Friable Pale, Friable N/A Granulation Quality: Small (1-33%) Medium (34-66%) N/A Necrotic A mount: Eschar, Adherent Slough Eschar, Adherent Slough N/A Necrotic Tissue: Fat Layer (Subcutaneous Tissue): Yes N/A N/A Exposed Structures: Fascia: No Tendon: No Muscle: No Joint: No Bone: No Small (1-33%) None N/A Epithelialization: Debridement - Selective/Open Wound Debridement - Selective/Open Wound N/A Debridement: Pre-procedure Verification/Time Out 15:25 15:25 N/A Taken: Lidocaine 5% topical ointment Lidocaine 5% topical ointment N/A Pain Control: [employer] N/A Tissue Debrided: Non-Viable Tissue Non-Viable Tissue N/A Level: 22.55 1.21 N/A Debridement A (sq cm): rea Curette Curette N/A Instrument: Minimum Minimum N/A Bleeding: Pressure Pressure N/A Hemostasis A chieved: 0 0 N/A Procedural Pain: 0 0 N/A Post Procedural Pain: Procedure was tolerated well Procedure was tolerated well N/A Debridement Treatment Response: 5.5x4.1x0.1 1.1x1.1x0.1 N/A Post Debridement Measurements L x W x D (cm) [name], [name] ([id number]) [id number] Page 4 of 9 1.771 0.095 N/A Post Debridement Volume: (cm) Excoriation: No Excoriation: Yes N/A Periwound Skin Texture: Maceration: No Maceration: Yes N/A Periwound Skin Moisture: No Abnormalities Noted Erythema: Yes N/A Periwound Skin Color: Yes N/A N/A Tenderness on Palpation: Compression Therapy Debridement N/A Procedures Performed: Debridement Treatment Notes Electronic Signature(s) Signed: 11/21/2022 3:50:21 PM By: Fredirick Maudlin MD FACS Entered By: Fredirick Maudlin on 11/21/2022  15:50:20 -------------------------------------------------------------------------------- Multi-Disciplinary Care Plan Details Patient Name: Date of Service: [name], [name] 11/21/2022 2:45 PM Medical Record Number: [id number] Patient Account Number: [id number] Date of Birth/Sex: Treating RN: 1945/12/09 (77 y.o. [name] Primary Care Sheehan Stacey: [name] Other Clinician: Referring Quantavius Humm: Treating Adael Culbreath/Extender: [name] Session in Treatment: 16 Active Inactive Wound/Skin Impairment Nursing Diagnoses: Impaired tissue integrity Goals: Ulcer/skin breakdown will have a volume reduction of 30% by week 4 Date Initiated: 08/03/2022 Date Inactivated: 09/10/2022 Target Resolution Date: 08/31/2022 Unmet Reason: uncontrolled edema, Goal Status: Unmet acute infection Ulcer/skin breakdown will have a volume reduction of 50% by week 8 Date Initiated: 09/10/2022 Target Resolution Date: 04/05/2023 Goal Status: Active Interventions: Assess ulceration(s) every visit Treatment Activities: Skin care regimen initiated : 08/03/2022 Notes: Keystone ordered. 10/30 RX Levo started. 09/10/22 Electronic Signature(s) Signed: 11/21/2022 4:58:33 PM By: Dellie Catholic RN Entered By: Dellie Catholic on 11/21/2022 16:47:24 -------------------------------------------------------------------------------- Pain Assessment Details Patient Name: Date of Service: [name], [name] 11/21/2022 2:45 PM Medical Record Number: [id number] Patient Account Number: [id number] Date of Birth/Sex: Treating RN: 1946-06-16 (77 y.o. [address] [place], [place] Lenna Sciara ([id number]) [id number] Page 5 of 9 Primary Care Almetta Liddicoat: [name] Other Clinician:  Referring Aaliyah Gavel: Treating Loyalty Arentz/Extender: [name] Session in Treatment: 16 Active Problems Location of Pain Severity and Description of Pain Patient Has Paino Yes Site  Locations Pain Location: Generalized Pain With Dressing Change: No Duration of the Pain. Constant / Intermittento Constant Rate the pain. Current Pain Level: 8 Worst Pain Level: 10 Least Pain Level: 5 Tolerable Pain Level: 7 Character of Pain Describe the Pain: Throbbing Pain Management and Medication Current Pain Management: Medication: Yes Cold Application: No Rest: Yes Massage: No Activity: No T.E.N.S.: No Heat Application: No Leg drop or elevation: No Is the Current Pain Management Adequate: Adequate Electronic Signature(s) Signed: 11/21/2022 4:58:33 PM By: Dellie Catholic RN Entered By: Dellie Catholic on 11/21/2022 15:24:54 -------------------------------------------------------------------------------- Patient/Caregiver Education Details Patient Name: Date of Service: [name] 1/17/2024andnbsp2:45 PM Medical Record Number: [id number] Patient Account Number: [id number] Date of Birth/Gender: Treating RN: 21-Aug-1946 (77 y.o. [name] Primary Care Physician: [name] Other Clinician: Referring Physician: Treating Physician/Extender: [name] Session in Treatment: 16 Education Assessment Education Provided To: Patient Education Topics Provided Wound/Skin Impairment: Methods: Explain/Verbal Responses: Return demonstration correctly Electronic Signature(s) Signed: 11/21/2022 4:58:33 PM By: Dellie Catholic RN Ferdinand Lango, Kenyah J ([id number]) [id number] Page 6 of 9 Signed: 11/21/2022 4:58:33 PM By: Dellie Catholic RN Entered By: Dellie Catholic on 11/21/2022 16:47:36 -------------------------------------------------------------------------------- Wound Assessment Details Patient Name: Date of Service: [name], [name] 11/21/2022 2:45 PM Medical Record Number: [id number] Patient Account Number: [id number] Date of Birth/Sex: Treating RN: 1946-03-14 (77 y.o. [name] Primary Care Merick Kelleher:  [name] Other Clinician: Referring Josephine Wooldridge: Treating Ashly Goethe/Extender: Pixie Casino Weeks in Treatment: 16 Wound Status Wound Number: 2 Primary Venous Leg Ulcer Etiology: Wound Location: Left, Posterior Lower Leg Wound Open Wounding Event: Blister Status: Date Acquired: 07/06/2022 Comorbid Cataracts, Asthma, Hypertension, Neuropathy, Received Weeks Of Treatment: 16 History: Chemotherapy, Received Radiation Clustered Wound: No Photos Wound Measurements Length: (cm) 5.5 Width: (cm) 4.1 Depth: (cm) 0.1 Area: (cm) 17.711 Volume: (cm) 1.771 % Reduction in Area: -157.7% % Reduction in Volume: -157.8% Epithelialization: Small (1-33%) Tunneling: No Undermining: No Wound Description Classification: Full Thickness Without Exposed Suppo Wound Margin: Flat and Intact Exudate Amount: Medium Exudate Type: Serosanguineous Exudate Color: red, brown rt Structures Foul Odor After Cleansing: No Slough/Fibrino Yes Wound Bed Granulation Amount: Large (67-100%) Exposed Structure Granulation Quality: Red, Friable Fascia Exposed: No Necrotic Amount: Small (1-33%) Fat Layer (Subcutaneous Tissue) Exposed: Yes Necrotic Quality: Eschar, Adherent Slough Tendon Exposed: No Muscle Exposed: No Joint Exposed: No Bone Exposed: No Periwound Skin Texture Texture Color No Abnormalities Noted: No No Abnormalities Noted: Yes Excoriation: No Temperature / Pain Tenderness on Palpation: Yes Moisture No Abnormalities Noted: No Maceration: No [name], [name] ([id number]) [id number] Page 7 of 9 Treatment Notes Wound #2 (Lower Leg) Wound Laterality: Left, Posterior Cleanser Soap and Water Discharge Instruction: May shower and wash wound with dial antibacterial soap and water prior to dressing change. Peri-Wound Care Sween Lotion (Moisturizing lotion) Discharge Instruction: Apply moisturizing lotion as  directed Topical Keystone Discharge Instruction: Apply as directed to wound Primary Dressing Sorbalgon AG Dressing, 4x4 (in/in) Discharge Instruction: Apply to wound bed as instructed Secondary Dressing Zetuvit Plus 4x8 in Discharge Instruction: Apply over primary dressing as directed. Secured With [employer] Surgical T 2x10 (in/yd) ape Discharge Instruction: Secure with tape as directed. Compression Wrap CoFlex TLC XL 2-layer Compression System 4x7 (in/yd) Discharge Instruction: Apply Calamine CoFlex 2-layer compression as directed. (alt for 4 layer) Compression Stockings Add-Ons Electronic Signature(s) Signed: 11/21/2022  4:58:33 PM By: Dellie Catholic RN Entered By: Dellie Catholic on 11/21/2022 15:20:08 -------------------------------------------------------------------------------- Wound Assessment Details Patient Name: Date of Service: [name], [name] 11/21/2022 2:45 PM Medical Record Number: [id number] Patient Account Number: [id number] Date of Birth/Sex: Treating RN: June 14, 1946 (77 y.o. [name] Primary Care Tamakia Porto: [name] Other Clinician: Referring Yoav Okane: Treating Kee Drudge/Extender: Pixie Casino Weeks in Treatment: 16 Wound Status Wound Number: 3 Primary Venous Leg Ulcer Etiology: Wound Location: Right, Lateral Ankle Wound Open Wounding Event: Contusion/Bruise Status: Date Acquired: 10/19/2022 Comorbid Cataracts, Asthma, Hypertension, Neuropathy, Received Weeks Of Treatment: 4 History: Chemotherapy, Received Radiation Clustered Wound: No Photos [name], [name] ([id number]) [id number] Page 8 of 9 Wound Measurements Length: (cm) 1.1 Width: (cm) 1.1 Depth: (cm) 0.1 Area: (cm) 0.95 Volume: (cm) 0.095 % Reduction in Area: 64.4% % Reduction in Volume: 64.4% Epithelialization: None Tunneling: No Undermining: No Wound Description Classification: Full Thickness Without  Exposed Suppor Exudate Amount: Medium Exudate Type: Purulent Exudate Color: yellow, brown, green t Structures Foul Odor After Cleansing: No Slough/Fibrino Yes Wound Bed Granulation Amount: Medium (34-66%) Granulation Quality: Pale, Friable Necrotic Amount: Medium (34-66%) Necrotic Quality: Eschar, Adherent Slough Periwound Skin Texture Texture Color No Abnormalities Noted: No No Abnormalities Noted: No Excoriation: Yes Erythema: Yes Moisture No Abnormalities Noted: No Maceration: Yes Treatment Notes Wound #3 (Ankle) Wound Laterality: Right, Lateral Cleanser Soap and Water Discharge Instruction: May shower and wash wound with dial antibacterial soap and water prior to dressing change. Peri-Wound Care Sween Lotion (Moisturizing lotion) Discharge Instruction: Apply moisturizing lotion as directed Topical Keystone Discharge Instruction: Apply as directed to wound Primary Dressing Sorbalgon AG Dressing 2x2 (in/in) Discharge Instruction: Apply to wound bed as instructed Secondary Dressing Zetuvit Plus 4x8 in Discharge Instruction: Apply over primary dressing as directed. Secured With [employer] Surgical T 2x10 (in/yd) ape Discharge Instruction: Secure with tape as directed. Compression Wrap CoFlex TLC XL 2-layer Compression System 4x7 (in/yd) Discharge Instruction: Apply Calamine CoFlex 2-layer compression as directed. (alt for 4 layer) Compression Stockings [name], [name] ([id number]) [id number].pdf Page 9 of 9 Add-Ons Electronic Signature(s) Signed: 11/21/2022 4:58:33 PM By: Dellie Catholic RN Entered By: Dellie Catholic on 11/21/2022 15:20:43 -------------------------------------------------------------------------------- Vitals Details Patient Name: Date of Service: [name] 11/21/2022 2:45 PM Medical Record Number: [id number] Patient Account Number: [id number] Date of Birth/Sex: Treating RN: [birth year] (77 y.o. [name] Primary Care Anthoney Sheppard: [name] Other Clinician: Referring Delphine Sizemore: Treating Adaijah Endres/Extender: [name] Session in Treatment: 16 Vital Signs Time Taken: 15:00 Temperature (F): 98 Height (in): 63 Pulse (bpm): 84 Weight (lbs): 200 Respiratory Rate (breaths/min): 18 Body Mass Index (BMI): 35.4 Blood Pressure (mmHg): 170/83 Reference Range: 80 - 120 mg / dl Electronic Signature(s) Signed: 11/21/2022 4:58:33 PM By: Dellie Catholic RN Entered By: Dellie Catholic on 11/21/2022 15:24:05

## 2022-11-22 ENCOUNTER — Telehealth: Payer: Self-pay | Admitting: Family Medicine

## 2022-11-22 DIAGNOSIS — K529 Noninfective gastroenteritis and colitis, unspecified: Secondary | ICD-10-CM

## 2022-11-22 NOTE — Telephone Encounter (Signed)
Lvm asking pt to call back. Need to relay Dr. G's message and get answer to his question.  

## 2022-11-22 NOTE — Telephone Encounter (Signed)
Plz notify [name] is booking out until May. As she previously saw [name] (Danis) would she be willing to try [name]?

## 2022-11-22 NOTE — Telephone Encounter (Addendum)
Referral placed to [facility] note reviewed.  Would suggest starting with gabapentin (Neurontin) low dose '100mg'$  nightly - does she remember what trouble she had with this previously? This is usually first line over Lyrica

## 2022-11-22 NOTE — Telephone Encounter (Signed)
Pt called returning Lisa's missed call. Told pt Dr. Synthia Innocent response, pt stated she would try out [facility].  Pt wanted to also let Dr. Darnell Level know that she's under treatment as wound center in [place] the provider she's seeing there, is having troubles with the skin in pt's feet & legs. Pt states she was diagnosed with "neuropathy". Pt states she was told by the provider, that she wanted to put the pt on some meds, neurontin & lyrica but the wouldn't be able to prescribe it & the pt's PCP would have to. Pt stated she was told both meds are very similar. Pt mentioned she has issues with neurontin in the past & would prefer trying out lyrica. Call back # [phone]

## 2022-11-22 NOTE — Addendum Note (Signed)
Addended by: [name] on: 11/22/2022 06:50 PM   Modules accepted: Orders

## 2022-11-23 NOTE — Telephone Encounter (Signed)
Spoke to patient by telephone and was advised that the gabapentin caused her to have a lot of confusion, her to sleep a lot and swelling in her legs. Patient stated that she even had those problems even on a low dose. Patient stated that she does not want to take Gabapentin again. Patient stated that the doctor at the wound center stated that has another patient that did a lot better on Lyrica than Gabapentin. Pharmacy [facility] [place]

## 2022-11-26 ENCOUNTER — Other Ambulatory Visit: Payer: Self-pay | Admitting: Family Medicine

## 2022-11-26 MED ORDER — PREGABALIN 25 MG PO CAPS: ORAL_CAPSULE | ORAL | 1 refills | Status: DC

## 2022-11-26 NOTE — Addendum Note (Signed)
Addended by: [name] on: 11/26/2022 05:14 PM   Modules accepted: Orders

## 2022-11-26 NOTE — Telephone Encounter (Addendum)
Ok I have sent lyrica take '25mg'$  nightly for 1 wk then increase to BID.  Let us know how she tolerates this.  Gabapentin added to allergy list.

## 2022-11-27 NOTE — Telephone Encounter (Signed)
Patient notified as instructed by telephone and verbalized understanding. 

## 2022-11-27 NOTE — Telephone Encounter (Signed)
Left message on voicemail for patient to call the office back. 

## 2022-11-28 ENCOUNTER — Encounter (HOSPITAL_BASED_OUTPATIENT_CLINIC_OR_DEPARTMENT_OTHER): Payer: Medicare Other | Admitting: General Surgery

## 2022-11-28 DIAGNOSIS — R600 Localized edema: Secondary | ICD-10-CM | POA: Diagnosis not present

## 2022-11-28 DIAGNOSIS — L97818 Non-pressure chronic ulcer of other part of right lower leg with other specified severity: Secondary | ICD-10-CM | POA: Diagnosis not present

## 2022-11-28 DIAGNOSIS — L97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d: Secondary | ICD-10-CM | POA: Diagnosis not present

## 2022-11-28 DIAGNOSIS — G629 Polyneuropathy, unspecified: Secondary | ICD-10-CM | POA: Diagnosis not present

## 2022-11-28 DIAGNOSIS — I83893 Varicose veins of bilateral lower extremities with other complications: Secondary | ICD-10-CM | POA: Diagnosis not present

## 2022-11-28 DIAGNOSIS — I872 Venous insufficiency (chronic) (peripheral): Secondary | ICD-10-CM | POA: Diagnosis not present

## 2022-11-28 DIAGNOSIS — L97222 Non-pressure chronic ulcer of left calf with fat layer exposed: Secondary | ICD-10-CM | POA: Diagnosis not present

## 2022-11-28 DIAGNOSIS — L97312 Non-pressure chronic ulcer of right ankle with fat layer exposed: Secondary | ICD-10-CM | POA: Diagnosis not present

## 2022-11-28 NOTE — Progress Notes (Signed)
Amber, [name] ([id number]) [id number] Page 1 of 9 Visit Report for 11/28/2022 Arrival Information Details Patient Name: Date of Service: [name], [name] 11/28/2022 2:15 PM Medical Record Number: [id number] Patient Account Number: [id number] Date of Birth/Sex: Treating RN: 13-Jul-1946 (77 y.o. [name] Primary Care Tanita Palinkas: [name] Other Clinician: Referring Ameyah Bangura: Treating Jonahtan Manseau/Extender: [name] Session in Treatment: 51 Visit Information History Since Last Visit Added or deleted any medications: Yes Patient Arrived: Ambulatory Any new allergies or adverse reactions: No Arrival Time: 14:44 Had a fall or experienced change in No Accompanied By: self activities of daily living that may affect Transfer Assistance: None risk of falls: Patient Identification Verified: Yes Signs or symptoms of abuse/neglect since last visito No Secondary Verification Process Completed: Yes Hospitalized since last visit: No Patient Requires Transmission-Based Precautions: No Implantable device outside of the clinic excluding No Patient Has Alerts: No cellular tissue based products placed in the center since last visit: Has Dressing in Place as Prescribed: Yes Has Compression in Place as Prescribed: Yes Pain Present Now: Yes Electronic Signature(s) Signed: 11/28/2022 5:02:56 PM By: Sharyn Creamer RN, [name] Entered By: Sharyn Creamer on 11/28/2022 14:44:51 -------------------------------------------------------------------------------- Compression Therapy Details Patient Name: Date of Service: [name] 11/28/2022 2:15 PM Medical Record Number: [id number] Patient Account Number: [id number] Date of Birth/Sex: Treating RN: [birth year] (77 y.o. [name] Primary Care Endiya Klahr: [name] Other Clinician: Referring Mena Simonis: Treating Natausha Jungwirth/Extender: [name] Session in Treatment:  17 Compression Therapy Performed for Wound Assessment: Wound #2 Left,Posterior Lower Leg Performed By: Clinician Sharyn Creamer, RN Compression Type: Three Layer Post Procedure Diagnosis Same as Pre-procedure Electronic Signature(s) Signed: 11/28/2022 5:02:56 PM By: Sharyn Creamer RN, [name] Entered By: Sharyn Creamer on 11/28/2022 16:28:03 NYARI, OLSSON ([id number]) [id number] Page 2 of 9 -------------------------------------------------------------------------------- Compression Therapy Details Patient Name: Date of Service: [name], [name] 11/28/2022 2:15 PM Medical Record Number: [id number] Patient Account Number: [id number] Date of Birth/Sex: Treating RN: 1946-07-16 (77 y.o. [name] Primary Care Perseus Westall: [name] Other Clinician: Referring Shamecka Hocutt: Treating Hau Sanor/Extender: [name] Session in Treatment: 17 Compression Therapy Performed for Wound Assessment: Wound #3 Right,Lateral Ankle Performed By: Clinician Sharyn Creamer, RN Compression Type: Three Layer Post Procedure Diagnosis Same as Pre-procedure Electronic Signature(s) Signed: 11/28/2022 5:02:56 PM By: Sharyn Creamer RN, [name] Entered By: Sharyn Creamer on 11/28/2022 16:28:03 -------------------------------------------------------------------------------- Encounter Discharge Information Details Patient Name: Date of Service: Amber, [name] 11/28/2022 2:15 PM Medical Record Number: [id number] Patient Account Number: [id number] Date of Birth/Sex: Treating RN: 1946-06-19 (77 y.o. [name] Primary Care Montavis Schubring: [name] Other Clinician: Referring Jomarie Gellis: Treating Jaella Weinert/Extender: [name] Session in Treatment: 17 Encounter Discharge Information Items Post Procedure Vitals Discharge Condition: Stable Temperature (F): 98.4 Ambulatory Status: Ambulatory Pulse (bpm): 76 Discharge Destination:  Home Respiratory Rate (breaths/min): 18 Transportation: Private Auto Blood Pressure (mmHg): 165/82 Accompanied By: self Schedule Follow-up Appointment: Yes Clinical Summary of Care: Patient Declined Electronic Signature(s) Signed: 11/28/2022 5:02:56 PM By: Sharyn Creamer RN, [name] Entered By: Sharyn Creamer on 11/28/2022 16:31:05 -------------------------------------------------------------------------------- Lower Extremity Assessment Details Patient Name: Date of Service: Amber, [name] 11/28/2022 2:15 PM Medical Record Number: [id number] Patient Account Number: [id number] Date of Birth/Sex: Treating RN: [birth year] (77 y.o. [name] Primary Care Besse Miron: [name] Other Clinician: Referring Chrislynn Mosely: Treating Johnathan Tortorelli/Extender: Pixie Casino Weeks in Treatment: 17 Edema Assessment Assessed: [name]: No] Patrice Paradise: No] [Left: Edema] Patrice Paradise: [name], [name] ([id number]) [id number] Page 3 of 9  Left: Right: Point of Measurement: From Medial Instep 38.3 cm 32.5 cm Ankle Left: Right: Point of Measurement: From Medial Instep 23.5 cm 22.5 cm Vascular Assessment Pulses: Dorsalis Pedis Palpable: [Left:Yes] [Right:Yes] Electronic Signature(s) Signed: 11/28/2022 5:02:56 PM By: Sharyn Creamer RN, [name] Entered By: Sharyn Creamer on 11/28/2022 14:47:02 -------------------------------------------------------------------------------- Multi Wound Chart Details Patient Name: Date of Service: [name] 11/28/2022 2:15 PM Medical Record Number: [id number] Patient Account Number: [id number] Date of Birth/Sex: Treating RN: [birth year] (77 y.o. F) Primary Care Emiliya Chretien: [name] Other Clinician: Referring Shila Kruczek: Treating Rini Moffit/Extender: [name] Session in Treatment: 17 Vital Signs Height(in): 23 Pulse(bpm): 36 Weight(lbs): 200 Blood Pressure(mmHg): 165/82 Body Mass Index(BMI):  35.4 Temperature(F): 98.4 Respiratory Rate(breaths/min): 18 [2:Photos:] [N/A:N/A] Left, Posterior Lower Leg Right, Lateral Ankle N/A Wound Location: Blister Contusion/Bruise N/A Wounding Event: Venous Leg Ulcer Venous Leg Ulcer N/A Primary Etiology: Cataracts, Asthma, Hypertension, Cataracts, Asthma, Hypertension, N/A Comorbid History: Neuropathy, Received Chemotherapy, Neuropathy, Received Chemotherapy, Received Radiation Received Radiation 07/06/2022 10/19/2022 N/A Date Acquired: 17 5 N/A Weeks of Treatment: Open Open N/A Wound Status: No No N/A Wound Recurrence: 5.5x2.4x0.1 1x0.8x0.1 N/A Measurements L x W x D (cm) 10.367 0.628 N/A A (cm) : rea 1.037 0.063 N/A Volume (cm) : -50.90% 76.50% N/A % Reduction in Area: -50.90% 76.40% N/A % Reduction in Volume: Full Thickness Without Exposed Full Thickness Without Exposed N/A Classification: Support Structures Support Structures Medium Medium N/A Exudate Amount: Serosanguineous Purulent N/A Exudate Type: red, brown yellow, brown, green N/A Exudate Color: Flat and Intact N/A N/A Wound Margin: Large (67-100%) Medium (34-66%) N/A Granulation Amount: Red, Friable Pale, Friable N/A Granulation QualityZAYANNA, PUNDT ([id number]) [id number] Page 4 of 9 Small (1-33%) Medium (34-66%) N/A Necrotic Amount: Eschar, Adherent Slough Eschar, Adherent Slough N/A Necrotic Tissue: Fat Layer (Subcutaneous Tissue): Yes N/A N/A Exposed Structures: Fascia: No Tendon: No Muscle: No Joint: No Bone: No Small (1-33%) None N/A Epithelialization: Debridement - Excisional Debridement - Selective/Open Wound N/A Debridement: Pre-procedure Verification/Time Out 14:53 14:53 N/A Taken: Lidocaine 5% topical ointment Lidocaine 5% topical ointment N/A Pain Control: Necrotic/Eschar, Subcutaneous, Necrotic/Eschar, Slough N/A Tissue Debrided: Slough Skin/Subcutaneous Tissue Non-Viable Tissue N/A Level: 13.2  0.8 N/A Debridement A (sq cm): rea Curette Curette N/A Instrument: Minimum Minimum N/A Bleeding: Pressure Pressure N/A Hemostasis Achieved: 3 3 N/A Procedural Pain: 0 0 N/A Post Procedural Pain: Debridement Treatment Response: Procedure was tolerated well Procedure was tolerated well N/A Post Debridement Measurements L x 5.5x2.4x0.1 1x0.8x0.1 N/A W x D (cm) 1.037 0.063 N/A Post Debridement Volume: (cm) Excoriation: No Excoriation: Yes N/A Periwound Skin Texture: Maceration: No Maceration: Yes N/A Periwound Skin Moisture: No Abnormalities Noted Erythema: Yes N/A Periwound Skin Color: Yes N/A N/A Tenderness on Palpation: Debridement Debridement N/A Procedures Performed: Treatment Notes Electronic Signature(s) Signed: 11/28/2022 3:12:39 PM By: Fredirick Maudlin MD FACS Entered By: Fredirick Maudlin on 11/28/2022 15:12:39 -------------------------------------------------------------------------------- Multi-Disciplinary Care Plan Details Patient Name: Date of Service: CHEYNE, BUNGERT 11/28/2022 2:15 PM Medical Record Number: [id number] Patient Account Number: [id number] Date of Birth/Sex: Treating RN: [birth year] (77 y.o. [name] Primary Care Graceanna Theissen: [name] Other Clinician: Referring Oddie Kuhlmann: Treating Shuan Statzer/Extender: [name] Session in Treatment: 17 Active Inactive Wound/Skin Impairment Nursing Diagnoses: Impaired tissue integrity Goals: Ulcer/skin breakdown will have a volume reduction of 30% by week 4 Date Initiated: 08/03/2022 Date Inactivated: 09/10/2022 Target Resolution Date: 08/31/2022 Unmet Reason: uncontrolled edema, Goal Status: Unmet acute infection Ulcer/skin breakdown will have a volume reduction of 50% by week 8 Date Initiated: 09/10/2022  Target Resolution Date: 04/05/2023 Goal Status: Active Interventions: Assess ulceration(s) every visit Treatment Activities: Skin care regimen initiated :  08/03/2022 JEZEL, BASTO ([id number]) [phone].pdf Page 5 of 9 Notes: Keystone ordered. 10/30 RX Levo started. 09/10/22 Electronic Signature(s) Signed: 11/28/2022 5:02:56 PM By: Sharyn Creamer RN, [name] Entered By: Sharyn Creamer on 11/28/2022 14:58:01 -------------------------------------------------------------------------------- Pain Assessment Details Patient Name: Date of Service: EVELISSE, SZALKOWSKI 11/28/2022 2:15 PM Medical Record Number: [id number] Patient Account Number: [id number] Date of Birth/Sex: Treating RN: 05-29-1946 (77 y.o. [name] Primary Care Jakera Beaupre: [name] Other Clinician: Referring Caeden Foots: Treating Ember Henrikson/Extender: [name] Session in Treatment: 17 Active Problems Location of Pain Severity and Description of Pain Patient Has Paino Yes Site Locations Rate the pain. Current Pain Level: 2 Worst Pain Level: 5 Pain Management and Medication Current Pain Management: Electronic Signature(s) Signed: 11/28/2022 5:02:56 PM By: Sharyn Creamer RN, [name] Entered By: Sharyn Creamer on 11/28/2022 14:45:40 -------------------------------------------------------------------------------- Patient/Caregiver Education Details Patient Name: Date of Service: [name] 1/24/2024andnbsp2:15 PM Medical Record Number: [id number] Patient Account Number: [id number] Date of Birth/Gender: Treating RN: Jun 06, 1946 (77 y.o. [name] Primary Care Physician: [name] Other Clinician: Referring Physician: Treating Physician/Extender: [name] Session in Treatment: [address] [place], [name] ([id number]) [id number] Page 6 of 9 Education Provided To: Patient Education Topics Provided Wound/Skin Impairment: Methods: Explain/Verbal Responses: State content correctly Electronic Signature(s) Signed: 11/28/2022 5:02:56 PM By: Sharyn Creamer  RN, [name] Entered By: Sharyn Creamer on 11/28/2022 15:02:33 -------------------------------------------------------------------------------- Wound Assessment Details Patient Name: Date of Service: JISELE, PRICE 11/28/2022 2:15 PM Medical Record Number: [id number] Patient Account Number: [id number] Date of Birth/Sex: Treating RN: 03/07/1946 (78 y.o. [name] Primary Care Takiya Belmares: [name] Other Clinician: Referring Kalid Ghan: Treating Andra Heslin/Extender: Pixie Casino Weeks in Treatment: 17 Wound Status Wound Number: 2 Primary Venous Leg Ulcer Etiology: Wound Location: Left, Posterior Lower Leg Wound Open Wounding Event: Blister Status: Date Acquired: 07/06/2022 Comorbid Cataracts, Asthma, Hypertension, Neuropathy, Received Weeks Of Treatment: 17 History: Chemotherapy, Received Radiation Clustered Wound: No Photos Wound Measurements Length: (cm) 5.5 Width: (cm) 2.4 Depth: (cm) 0.1 Area: (cm) 10.367 Volume: (cm) 1.037 % Reduction in Area: -50.9% % Reduction in Volume: -50.9% Epithelialization: Small (1-33%) Tunneling: No Undermining: No Wound Description Classification: Full Thickness Without Exposed Support Structures Wound Margin: Flat and Intact Exudate Amount: Medium Exudate Type: Serosanguineous Exudate Color: red, brown Foul Odor After Cleansing: No Slough/Fibrino Yes Wound Bed Granulation Amount: Large (67-100%) Exposed Structure Granulation Quality: Red, Friable Fascia Exposed: No Necrotic Amount: Small (1-33%) Fat Layer (Subcutaneous Tissue) Exposed: Yes Necrotic Quality: Eschar, Adherent Slough Tendon Exposed: No Muscle Exposed: No AJAYLA, IGLESIAS ([id number]) [id number] Page 7 of 9 Joint Exposed: No Bone Exposed: No Periwound Skin Texture Texture Color No Abnormalities Noted: No No Abnormalities Noted: Yes Excoriation: No Temperature / Pain Tenderness on Palpation: Yes Moisture No  Abnormalities Noted: No Maceration: No Treatment Notes Wound #2 (Lower Leg) Wound Laterality: Left, Posterior Cleanser Soap and Water Discharge Instruction: May shower and wash wound with dial antibacterial soap and water prior to dressing change. Peri-Wound Care Sween Lotion (Moisturizing lotion) Discharge Instruction: Apply moisturizing lotion as directed Topical Keystone Discharge Instruction: Apply as directed to wound Primary Dressing Cutimed Sorbact Swab Discharge Instruction: Apply to wound bed as instructed Secondary Dressing Zetuvit Plus 4x8 in Discharge Instruction: Apply over primary dressing as directed. Secured With [employer] Surgical T 2x10 (in/yd) ape Discharge Instruction: Secure with tape as directed. Compression Wrap  CoFlex TLC XL 2-layer Compression System 4x7 (in/yd) Discharge Instruction: Apply Calamine CoFlex 2-layer compression as directed. (alt for 4 layer) Compression Stockings Add-Ons Electronic Signature(s) Signed: 11/28/2022 5:02:56 PM By: Sharyn Creamer RN, [name] Entered By: Sharyn Creamer on 11/28/2022 14:50:39 -------------------------------------------------------------------------------- Wound Assessment Details Patient Name: Date of Service: BRAEDEN, DOLINSKI 11/28/2022 2:15 PM Medical Record Number: [id number] Patient Account Number: [id number] Date of Birth/Sex: Treating RN: 1946/03/25 (77 y.o. [name] Primary Care Kade Demicco: [name] Other Clinician: Referring Juliyah Mergen: Treating Cornel Werber/Extender: Pixie Casino Weeks in Treatment: 17 Wound Status Wound Number: 3 Primary Venous Leg Ulcer Etiology: Wound Location: Right, Lateral Ankle Wound Open Wounding Event: Contusion/Bruise Status: Date Acquired: 10/19/2022 Comorbid Cataracts, Asthma, Hypertension, Neuropathy, Received Weeks Of Treatment: 5 History: Chemotherapy, Received Radiation VICKE, PLOTNER ([id number])  [id number] Page 8 of 9 History: Chemotherapy, Received Radiation Clustered Wound: No Photos Wound Measurements Length: (cm) 1 Width: (cm) 0.8 Depth: (cm) 0.1 Area: (cm) 0.628 Volume: (cm) 0.063 % Reduction in Area: 76.5% % Reduction in Volume: 76.4% Epithelialization: None Tunneling: No Undermining: No Wound Description Classification: Full Thickness Without Exposed Suppor Exudate Amount: Medium Exudate Type: Purulent Exudate Color: yellow, brown, green t Structures Foul Odor After Cleansing: No Slough/Fibrino Yes Wound Bed Granulation Amount: Medium (34-66%) Granulation Quality: Pale, Friable Necrotic Amount: Medium (34-66%) Necrotic Quality: Eschar, Adherent Slough Periwound Skin Texture Texture Color No Abnormalities Noted: No No Abnormalities Noted: No Excoriation: Yes Erythema: Yes Moisture No Abnormalities Noted: No Maceration: Yes Treatment Notes Wound #3 (Ankle) Wound Laterality: Right, Lateral Cleanser Soap and Water Discharge Instruction: May shower and wash wound with dial antibacterial soap and water prior to dressing change. Peri-Wound Care Sween Lotion (Moisturizing lotion) Discharge Instruction: Apply moisturizing lotion as directed Topical Keystone Discharge Instruction: Apply as directed to wound Primary Dressing Cutimed Sorbact Swab Discharge Instruction: Apply to wound bed as instructed Secondary Dressing Zetuvit Plus 4x8 in Discharge Instruction: Apply over primary dressing as directed. Secured With [employer] Surgical T 2x10 (in/yd) ape Discharge Instruction: Secure with tape as directed. Compression Wrap CoFlex TLC XL 2-layer Compression System 4x7 (in/yd) LATINA, FRANK ([id number]) [id number] Page 9 of 9 Discharge Instruction: Apply Calamine CoFlex 2-layer compression as directed. (alt for 4 layer) Compression Stockings Add-Ons Electronic Signature(s) Signed:  11/28/2022 5:02:56 PM By: Sharyn Creamer RN, [name] Entered By: Sharyn Creamer on 11/28/2022 14:51:27 -------------------------------------------------------------------------------- Isabel Details Patient Name: Date of Service: [name] 11/28/2022 2:15 PM Medical Record Number: [id number] Patient Account Number: [id number] Date of Birth/Sex: Treating RN: 07/13/1946 (77 y.o. [name] Primary Care Fatumata Kashani: [name] Other Clinician: Referring Adelisa Satterwhite: Treating Temitope Flammer/Extender: [name] Session in Treatment: 17 Vital Signs Time Taken: 14:35 Temperature (F): 98.4 Height (in): 63 Pulse (bpm): 76 Weight (lbs): 200 Respiratory Rate (breaths/min): 18 Body Mass Index (BMI): 35.4 Blood Pressure (mmHg): 165/82 Reference Range: 80 - 120 mg / dl Electronic Signature(s) Signed: 11/28/2022 5:02:56 PM By: Sharyn Creamer RN, [name] Entered By: Sharyn Creamer on 11/28/2022 14:45:20

## 2022-11-28 NOTE — Progress Notes (Signed)
KAZIYAH, PARKISON ([id number]) [id number] Page 1 of 12 Visit Report for 11/28/2022 Chief Complaint Document Details Patient Name: Date of Service: [name], [name] 11/28/2022 2:15 PM Medical Record Number: [id number] Patient Account Number: [id number] Date of Birth/Sex: Treating RN: 05-02-1946 (77 y.o. F) Primary Care Provider: [name] Other Clinician: Referring Provider: Treating Provider/Extender: [name] Session in Treatment: 17 Information Obtained from: Patient Chief Complaint Patient presents for treatment of an open ulcer due to venous insufficiency Electronic Signature(s) Signed: 11/28/2022 3:12:48 PM By: Fredirick Maudlin MD FACS Entered By: Fredirick Maudlin on 11/28/2022 15:12:47 -------------------------------------------------------------------------------- Debridement Details Patient Name: Date of Service: [name], [name] 11/28/2022 2:15 PM Medical Record Number: [id number] Patient Account Number: [id number] Date of Birth/Sex: Treating RN: [birth year] (76 y.o. [name] Primary Care Provider: [name] Other Clinician: Referring Provider: Treating Provider/Extender: [name] Session in Treatment: 17 Debridement Performed for Assessment: Wound #3 Right,Lateral Ankle Performed By: Physician Fredirick Maudlin, MD Debridement Type: Debridement Severity of Tissue Pre Debridement: Fat layer exposed Level of Consciousness (Pre-procedure): Awake and Alert Pre-procedure Verification/Time Out Yes - 14:53 Taken: Start Time: 14:58 Pain Control: Lidocaine 5% topical ointment T Area Debrided (L x W): otal 1 (cm) x 0.8 (cm) = 0.8 (cm) Tissue and other material debrided: Non-Viable, Eschar, Slough, Slough Level: Non-Viable Tissue Debridement Description: Selective/Open Wound Instrument: Curette Bleeding: Minimum Hemostasis Achieved: Pressure Procedural Pain: 3 Post Procedural Pain:  0 Response to Treatment: Procedure was tolerated well Level of Consciousness (Post- Awake and Alert procedure): Post Debridement Measurements of Total Wound Length: (cm) 1 Width: (cm) 0.8 Depth: (cm) 0.1 Volume: (cm) 0.063 Character of Wound/Ulcer Post Debridement: Improved Severity of Tissue Post Debridement: Fat layer exposed MACKENSI, MAHADEO ([id number]) [phone].pdf Page 2 of 12 Post Procedure Diagnosis Same as Pre-procedure Notes Scribed for Dr Celine Ahr by Sharyn Creamer, Rn Electronic Signature(s) Signed: 11/28/2022 3:33:25 PM By: Fredirick Maudlin MD FACS Signed: 11/28/2022 5:02:56 PM By: Sharyn Creamer RN, [name] Entered By: Sharyn Creamer on 11/28/2022 15:01:13 -------------------------------------------------------------------------------- Debridement Details Patient Name: Date of Service: [name], [name] 11/28/2022 2:15 PM Medical Record Number: [id number] Patient Account Number: [id number] Date of Birth/Sex: Treating RN: [birth year] (77 y.o. [name] Primary Care Provider: [name] Other Clinician: Referring Provider: Treating Provider/Extender: [name] Session in Treatment: 17 Debridement Performed for Assessment: Wound #2 Left,Posterior Lower Leg Performed By: Physician Fredirick Maudlin, MD Debridement Type: Debridement Severity of Tissue Pre Debridement: Fat layer exposed Level of Consciousness (Pre-procedure): Awake and Alert Pre-procedure Verification/Time Out Yes - 14:53 Taken: Start Time: 14:58 Pain Control: Lidocaine 5% topical ointment T Area Debrided (L x W): otal 5.5 (cm) x 2.4 (cm) = 13.2 (cm) Tissue and other material debrided: Non-Viable, Eschar, Slough, Subcutaneous, Slough Level: Skin/Subcutaneous Tissue Debridement Description: Excisional Instrument: Curette Bleeding: Minimum Hemostasis Achieved: Pressure Procedural Pain: 3 Post Procedural Pain: 0 Response to Treatment: Procedure  was tolerated well Level of Consciousness (Post- Awake and Alert procedure): Post Debridement Measurements of Total Wound Length: (cm) 5.5 Width: (cm) 2.4 Depth: (cm) 0.1 Volume: (cm) 1.037 Character of Wound/Ulcer Post Debridement: Improved Severity of Tissue Post Debridement: Fat layer exposed Post Procedure Diagnosis Same as Pre-procedure Notes Scribed for Dr Celine Ahr by Sharyn Creamer, Rn Electronic Signature(s) Signed: 11/28/2022 3:33:25 PM By: Fredirick Maudlin MD FACS Signed: 11/28/2022 5:02:56 PM By: Sharyn Creamer RN, [name] Entered By: Sharyn Creamer on 11/28/2022 15:04:05 [name] ([id number]) [id number] Page 3 of 12 -------------------------------------------------------------------------------- HPI Details Patient Name: Date of Service: [name], [name]  11/28/2022 2:15 PM Medical Record Number: [id number] Patient Account Number: [id number] Date of Birth/Sex: Treating RN: [birth year] (77 y.o. F) Primary Care Provider: [name] Other Clinician: Referring Provider: Treating Provider/Extender: [name] Session in Treatment: 56 History of Present Illness HPI Description: ADMISSION This is a 77 year old woman with a history of chronic venous insufficiency status post saphenous vein ablations in 2010 and 2016. 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 She presents today with an open venous ulcer on her left lower leg. It has been present for a little over 2 weeks. She saw her primary care provider who apparently swabbed the wound and grew out Pseudomonas. She just completed a course of ciprofloxacin for this. ABI was 0.91. She reports that she has had previous issues with ulcers in this same location, stemming back to a punch biopsy taken by a dermatologist many years ago. She has had several skin substitutes applied to the area that have ultimately resulted in healing on prior occasions. She  has 2 small ulcers on her left medial lower leg. There is slough accumulation in both of them. The more anterior of the 2 is quite small and has some soft slough on the surface, underneath which there is good granulation tissue. The more medial wound also has slough accumulation, but the underlying surface is fairly fibrotic and gritty. This is consistent with her provided history of multiple ulcers in the same location. 08/03/2022: The anterior wound is smaller today with just a little bit of slough accumulation. The more medial wound continues to be very sensitive and fibrotic with slough buildup. 08/13/2022: The anterior wound is closed. The more medial wound remains sensitive with a fairly fibrotic surface. There is more granulation tissue filling in, however, and there is less slough than on prior occasions. 08/20/2022: The anterior wound remains closed. The more medial wound has filled with granulation tissue but still has a fair amount of slough on the surface and remains fairly tender. Unfortunately, she has developed 2 small ulcers just proximal to this. The fat layer is exposed in each. She says that over the weekend, she felt a burning sensation in that location. 08/27/2022: The 2 small wounds proximal to the main wound have merged into a single site. The wounds look a little bit dry, but they are quite clean without significant slough accumulation. They remain quite tender. 09/03/2022: All of the wounds have now merged into 1 large wound. There is a strong odor coming from the wound and the surface is not particularly viable. It is extremely painful for her today. 09/10/2022: The wound is less black and purple this week but still does not look particularly viable. The surface is desiccated. The culture that I took last week returned with a polymicrobial population including Pseudomonas. I prescribed both Augmentin and levofloxacin. She has not yet picked up levofloxacin, as her pharmacy only  notified her yesterday that it was ready. We have ordered a Keystone topical compounded antibiotic, but it has not yet come. 09/17/2022: The wound surface is markedly improved today. There is still an area of grayish-looking muscle but the rest appears significantly more viable. There is still a layer of slough on the surface. She still has a couple days left of oral antibiotic therapy. She has her [employer] compounded topical antibiotic with her today. 09/24/2022: She has completed her oral antibiotic therapy. The wound surface is much cleaner today and more viable without any necrotic tissue. It is a bit desiccated,  however. 10/01/2022: The moisture balance of the wound has improved. There is a layer of yellow slough on the surface, but beneath this, the wound is more pink. 10/08/2022: The wound is smaller and cleaner today with a layer of slough present. She is having less pain. 12/18; this patient has a new wound on the right lateral ankle which apparently started after a cat scratched her. Secondarily she managed to drop a cake pan on the same area. This is very painful. The original wound was on the left posterior calf we have been using [employer] under an [employer]. The Unna boot fell down and apparently she was in for a nurse visit perhaps last week and that 1 fell down as well This patient has central venous mediated hypertension. For member correctly she has a stent placed in the left common iliac artery by Dr. Donzetta Matters some years ago [facility] Thurner syndrome] she also has had venous ablations and I believe a history of a DVT The patient has compression pumps at home but she does not use them 10/30/2022: The patient says that her wounds burned all week with the [facility] classic in place. She has a fair amount of slough and nonviable subcutaneous tissue present on the right ankle. The left posterior calf wound is cleaner than I have seen it to date. Both remain fairly tender. 11/14/2022:  Both wounds have substantial slough and eschar accumulation. They remain extremely tender. 11/21/2022: The wounds are cleaner this week, but still have slough and eschar accumulation. She continues to describe burning pain in both wounds, but upon further questioning, she actually has burning in both of her feet that radiates up her legs and includes to the wounds. 11/28/2022: Both wounds have slough and eschar accumulation, as well as adherent silver alginate that was difficult to remove. She continues to have pain out of proportion to the extent of her wounds. Her PCP did initiate Lyrica which she started taking last night. The dose is quite low, only 25 mg, but apparently there is a plan for upward titration, assuming she tolerates the medication. Electronic Signature(s) Signed: 11/28/2022 3:14:18 PM By: Fredirick Maudlin MD [place], [name] ([id number]) [id number] Page 4 of 12 Entered By: Fredirick Maudlin on 11/28/2022 15:14:18 -------------------------------------------------------------------------------- Physical Exam Details Patient Name: Date of Service: [name], [name] 11/28/2022 2:15 PM Medical Record Number: [id number] Patient Account Number: [id number] Date of Birth/Sex: Treating RN: 27-Feb-1946 (77 y.o. F) Primary Care Provider: [name] Other Clinician: Referring Provider: Treating Provider/Extender: [name] Session in Treatment: 17 Constitutional Hypertensive, asymptomatic. . . . no acute distress. Respiratory Normal work of breathing on room air. Notes 11/28/2022: Both wounds have slough and eschar accumulation, as well as adherent silver alginate that was difficult to remove. She continues to have pain out of proportion to the extent of her wounds. Electronic Signature(s) Signed: 11/28/2022 3:15:26 PM By: Fredirick Maudlin MD FACS Entered By: Fredirick Maudlin on 11/28/2022  15:15:26 -------------------------------------------------------------------------------- Physician Orders Details Patient Name: Date of Service: AHNIYAH, GIANCOLA 11/28/2022 2:15 PM Medical Record Number: [id number] Patient Account Number: [id number] Date of Birth/Sex: Treating RN: Jul 12, 1946 (77 y.o. [name] Primary Care Provider: [name] Other Clinician: Referring Provider: Treating Provider/Extender: [name] Session in Treatment: [phone] Verbal / Phone Orders: No Diagnosis Coding ICD-10 Coding Code Description [phone] Non-pressure chronic ulcer of other part of left lower leg with fat layer exposed L97.818 Non-pressure chronic ulcer of other part of right lower leg with other  specified severity I83.893 Varicose veins of bilateral lower extremities with other complications B35.3 Venous insufficiency (chronic) (peripheral) G62.9 Polyneuropathy, unspecified R60.0 Localized edema L03.115 Cellulitis of right lower limb Follow-up Appointments ppointment in 1 week. - Dr. Celine Ahr RM 4 Return A Anesthetic Wound #2 Left,Posterior Lower Leg (In clinic) Topical Lidocaine 5% applied to wound bed - in clinic, prior to debridement Edema Control - Lymphedema / SCD / Other Left Lower Extremity REISA, COPPOLA ([id number]) [id number] Page 5 of 12 Lymphedema Pumps. Use Lymphedema pumps on leg(s) 2-3 times a day for 45-60 minutes. If wearing any wraps or hose, do not remove them. Continue exercising as instructed. - Use x1 per day Avoid standing for long periods of time. Patient to wear own compression stockings every day. - Right lower extremity Moisturize legs daily. Wound Treatment Wound #2 - Lower Leg Wound Laterality: Left, Posterior Cleanser: Soap and Water 1 x Per Week/30 Days Discharge Instructions: May shower and wash wound with dial antibacterial soap and water prior to dressing change. Peri-Wound Care: Sween Lotion  (Moisturizing lotion) 1 x Per Week/30 Days Discharge Instructions: Apply moisturizing lotion as directed Topical: Keystone 1 x Per Week/30 Days Discharge Instructions: Apply as directed to wound Prim Dressing: Cutimed Sorbact Swab 1 x Per Week/30 Days ary Discharge Instructions: Apply to wound bed as instructed Secondary Dressing: Zetuvit Plus 4x8 in 1 x Per Week/30 Days Discharge Instructions: Apply over primary dressing as directed. Secured With: 890M Medipore Public affairs consultant Surgical T 2x10 (in/yd) 1 x Per Week/30 Days ape Discharge Instructions: Secure with tape as directed. Compression Wrap: CoFlex TLC XL 2-layer Compression System 4x7 (in/yd) 1 x Per Week/30 Days Discharge Instructions: Apply Calamine CoFlex 2-layer compression as directed. (alt for 4 layer) Wound #3 - Ankle Wound Laterality: Right, Lateral Cleanser: Soap and Water 1 x Per Week/30 Days Discharge Instructions: May shower and wash wound with dial antibacterial soap and water prior to dressing change. Peri-Wound Care: Sween Lotion (Moisturizing lotion) 1 x Per Week/30 Days Discharge Instructions: Apply moisturizing lotion as directed Topical: Keystone 1 x Per Week/30 Days Discharge Instructions: Apply as directed to wound Prim Dressing: Cutimed Sorbact Swab 1 x Per Week/30 Days ary Discharge Instructions: Apply to wound bed as instructed Secondary Dressing: Zetuvit Plus 4x8 in 1 x Per Week/30 Days Discharge Instructions: Apply over primary dressing as directed. Secured With: 890M Medipore Public affairs consultant Surgical T 2x10 (in/yd) 1 x Per Week/30 Days ape Discharge Instructions: Secure with tape as directed. Compression Wrap: CoFlex TLC XL 2-layer Compression System 4x7 (in/yd) 1 x Per Week/30 Days Discharge Instructions: Apply Calamine CoFlex 2-layer compression as directed. (alt for 4 layer) Patient Medications llergies: sulfa antibiotics, Voltaren, doxycycline, Neoprene, adhesive, latex A Notifications Medication Indication  Start End prior to debridement 11/28/2022 lidocaine DOSE topical 5 % ointment - ointment topical once daily Electronic Signature(s) Signed: 11/28/2022 3:33:25 PM By: Fredirick Maudlin MD FACS Entered By: Fredirick Maudlin on 11/28/2022 15:16:05 -------------------------------------------------------------------------------- Problem List Details Patient Name: Date of Service: STEPHANY, POORMAN 11/28/2022 2:15 PM [name] ([id number]) [id number] Page 6 of 12 Medical Record Number: [id number] Patient Account Number: [id number] Date of Birth/Sex: Treating RN: May 30, 1946 (77 y.o. F) Primary Care Provider: [name] Other Clinician: Referring Provider: Treating Provider/Extender: [name] Session in Treatment: 17 Active Problems ICD-10 Encounter Code Description Active Date MDM Diagnosis [phone] Non-pressure chronic ulcer of other part of left lower leg with fat layer exposed9/22/2023 No Yes L97.818 Non-pressure chronic ulcer of other part of right lower leg  with other specified 10/22/2022 No Yes severity I83.893 Varicose veins of bilateral lower extremities with other complications 02/11/8118 No Yes I87.2 Venous insufficiency (chronic) (peripheral) 07/27/2022 No Yes G62.9 Polyneuropathy, unspecified 07/27/2022 No Yes R60.0 Localized edema 07/27/2022 No Yes L03.115 Cellulitis of right lower limb 10/22/2022 No Yes Inactive Problems Resolved Problems Electronic Signature(s) Signed: 11/28/2022 3:11:25 PM By: Fredirick Maudlin MD FACS Entered By: Fredirick Maudlin on 11/28/2022 15:11:25 -------------------------------------------------------------------------------- Progress Note Details Patient Name: Date of Service: [name] 11/28/2022 2:15 PM Medical Record Number: [id number] Patient Account Number: [id number] Date of Birth/Sex: Treating RN: [birth year] (77 y.o. F) Primary Care Provider: [name] Other  Clinician: Referring Provider: Treating Provider/Extender: [name] Session in Treatment: 17 Subjective Chief Complaint Information obtained from Patient Patient presents for treatment of an open ulcer due to venous insufficiency History of Present Illness (HPI) ADMISSION ELISABET, [name] ([id number]) [id number] Page 7 of 12 This is a 77 year old woman with a history of chronic venous insufficiency status post saphenous vein ablations in 2010 and 2016. 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 She presents today with an open venous ulcer on her left lower leg. It has been present for a little over 2 weeks. She saw her primary care provider who apparently swabbed the wound and grew out Pseudomonas. She just completed a course of ciprofloxacin for this. ABI was 0.91. She reports that she has had previous issues with ulcers in this same location, stemming back to a punch biopsy taken by a dermatologist many years ago. She has had several skin substitutes applied to the area that have ultimately resulted in healing on prior occasions. She has 2 small ulcers on her left medial lower leg. There is slough accumulation in both of them. The more anterior of the 2 is quite small and has some soft slough on the surface, underneath which there is good granulation tissue. The more medial wound also has slough accumulation, but the underlying surface is fairly fibrotic and gritty. This is consistent with her provided history of multiple ulcers in the same location. 08/03/2022: The anterior wound is smaller today with just a little bit of slough accumulation. The more medial wound continues to be very sensitive and fibrotic with slough buildup. 08/13/2022: The anterior wound is closed. The more medial wound remains sensitive with a fairly fibrotic surface. There is more granulation tissue filling in, however, and there is less slough  than on prior occasions. 08/20/2022: The anterior wound remains closed. The more medial wound has filled with granulation tissue but still has a fair amount of slough on the surface and remains fairly tender. Unfortunately, she has developed 2 small ulcers just proximal to this. The fat layer is exposed in each. She says that over the weekend, she felt a burning sensation in that location. 08/27/2022: The 2 small wounds proximal to the main wound have merged into a single site. The wounds look a little bit dry, but they are quite clean without significant slough accumulation. They remain quite tender. 09/03/2022: All of the wounds have now merged into 1 large wound. There is a strong odor coming from the wound and the surface is not particularly viable. It is extremely painful for her today. 09/10/2022: The wound is less black and purple this week but still does not look particularly viable. The surface is desiccated. The culture that I took last week returned with a polymicrobial population including Pseudomonas. I prescribed both Augmentin and levofloxacin. She has not  yet picked up levofloxacin, as her pharmacy only notified her yesterday that it was ready. We have ordered a Keystone topical compounded antibiotic, but it has not yet come. 09/17/2022: The wound surface is markedly improved today. There is still an area of grayish-looking muscle but the rest appears significantly more viable. There is still a layer of slough on the surface. She still has a couple days left of oral antibiotic therapy. She has her [employer] compounded topical antibiotic with her today. 09/24/2022: She has completed her oral antibiotic therapy. The wound surface is much cleaner today and more viable without any necrotic tissue. It is a bit desiccated, however. 10/01/2022: The moisture balance of the wound has improved. There is a layer of yellow slough on the surface, but beneath this, the wound is more pink. 10/08/2022:  The wound is smaller and cleaner today with a layer of slough present. She is having less pain. 12/18; this patient has a new wound on the right lateral ankle which apparently started after a cat scratched her. Secondarily she managed to drop a cake pan on the same area. This is very painful. The original wound was on the left posterior calf we have been using [employer] under an [employer]. The Unna boot fell down and apparently she was in for a nurse visit perhaps last week and that 1 fell down as well This patient has central venous mediated hypertension. For member correctly she has a stent placed in the left common iliac artery by Dr. Donzetta Matters some years ago [facility] Thurner syndrome] she also has had venous ablations and I believe a history of a DVT The patient has compression pumps at home but she does not use them 10/30/2022: The patient says that her wounds burned all week with the [facility] classic in place. She has a fair amount of slough and nonviable subcutaneous tissue present on the right ankle. The left posterior calf wound is cleaner than I have seen it to date. Both remain fairly tender. 11/14/2022: Both wounds have substantial slough and eschar accumulation. They remain extremely tender. 11/21/2022: The wounds are cleaner this week, but still have slough and eschar accumulation. She continues to describe burning pain in both wounds, but upon further questioning, she actually has burning in both of her feet that radiates up her legs and includes to the wounds. 11/28/2022: Both wounds have slough and eschar accumulation, as well as adherent silver alginate that was difficult to remove. She continues to have pain out of proportion to the extent of her wounds. Her PCP did initiate Lyrica which she started taking last night. The dose is quite low, only 25 mg, but apparently there is a plan for upward titration, assuming she tolerates the medication. Patient History Information obtained  from Patient. Family History Cancer - Mother,Paternal Grandparents, Diabetes - Siblings, Heart Disease - Father, Hypertension - Father. Social History Former smoker - quit in 1967. Medical History Eyes Patient has history of Cataracts - L eye Ear/Nose/Mouth/Throat Denies history of Chronic sinus problems/congestion, Middle ear problems Hematologic/Lymphatic Denies history of Anemia, Human Immunodeficiency Virus, Lymphedema Respiratory Patient has history of Asthma Cardiovascular Patient has history of Hypertension Endocrine Denies history of Type I Diabetes, Type II Diabetes Neurologic Patient has history of Neuropathy - Feet and finger Denies history of Seizure Disorder Oncologic Patient has history of Received Chemotherapy - 2012, Received Radiation - 2012 TONESHA, TSOU ([id number]) [phone].pdf Page 8 of 12 Hospitalization/Surgery History - Lower extremity venography 2020. - Intravascular ultrasound. -  peripheral vascular intervention. - melanoma 2011. 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 no acute distress. Vitals Time Taken: 2:35 PM, Height: 63 in, Weight: 200 lbs, BMI: 35.4, Temperature: 98.4 F, Pulse: 76 bpm, Respiratory Rate: 18 breaths/min, Blood Pressure: 165/82 mmHg. Respiratory Normal work of breathing on room air. General Notes: 11/28/2022: Both wounds have slough and eschar accumulation, as well as adherent silver alginate that was difficult to remove. She continues to have pain out of proportion to the extent of her wounds. Integumentary (Hair, Skin) Wound #2 status is Open. Original cause of wound was Blister. The date acquired was: 07/06/2022. The wound has been in  treatment 17 weeks. The wound is located on the Left,Posterior Lower Leg. The wound measures 5.5cm length x 2.4cm width x 0.1cm depth; 10.367cm^2 area and 1.037cm^3 volume. There is Fat Layer (Subcutaneous Tissue) exposed. There is no tunneling or undermining noted. There is a medium amount of serosanguineous drainage noted. The wound margin is flat and intact. There is large (67-100%) red, friable granulation within the wound bed. There is a small (1-33%) amount of necrotic tissue within the wound bed including Eschar and Adherent Slough. The periwound skin appearance had no abnormalities noted for color. The periwound skin appearance did not exhibit: Excoriation, Maceration. The periwound has tenderness on palpation. Wound #3 status is Open. Original cause of wound was Contusion/Bruise. The date acquired was: 10/19/2022. The wound has been in treatment 5 weeks. The wound is located on the Right,Lateral Ankle. The wound measures 1cm length x 0.8cm width x 0.1cm depth; 0.628cm^2 area and 0.063cm^3 volume. There is no tunneling or undermining noted. There is a medium amount of purulent drainage noted. There is medium (34-66%) pale, friable granulation within the wound bed. There is a medium (34-66%) amount of necrotic tissue within the wound bed including Eschar and Adherent Slough. The periwound skin appearance exhibited: Excoriation, Maceration, Erythema. The surrounding wound skin color is noted with erythema. 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 There was a Excisional Skin/Subcutaneous Tissue Debridement with a total area of 13.2 sq cm performed by Fredirick Maudlin, MD. With the following instrument(s): Curette to remove Non-Viable tissue/material. Material removed includes Eschar, Subcutaneous Tissue, and Slough after achieving pain control using Lidocaine 5% topical ointment. No specimens were taken. A time out was conducted at 14:53, prior to the start of the procedure. A Minimum amount of bleeding was controlled with Pressure. The procedure was tolerated well with a pain level of 3 throughout and a pain level of 0 following the procedure. Post Debridement Measurements: 5.5cm length x 2.4cm width x 0.1cm depth; 1.037cm^3 volume. Character of Wound/Ulcer Post Debridement is improved. Severity of Tissue Post Debridement is: Fat layer exposed. Post procedure Diagnosis Wound #2: Same as Pre-Procedure General Notes: Scribed for Dr Celine Ahr by Sharyn Creamer, Ivalee, Curran ([id number]) [id number] Page 9 of 12 Wound #3 Pre-procedure diagnosis of Wound #  3 is a Venous Leg Ulcer located on the Right,Lateral Ankle .Severity of Tissue Pre Debridement is: Fat layer exposed. There was a Selective/Open Wound Non-Viable Tissue Debridement with a total area of 0.8 sq cm performed by Fredirick Maudlin, MD. With the following instrument(s): Curette to remove Non-Viable tissue/material. Material removed includes Eschar and Slough and after achieving pain control using Lidocaine 5% topical ointment. No specimens were taken. A time out was conducted at 14:53, prior to the start of the procedure. A Minimum amount of bleeding was controlled with Pressure. The procedure was tolerated well with a pain level of 3 throughout and a pain level of 0 following the procedure. Post Debridement Measurements: 1cm length x 0.8cm width x 0.1cm depth; 0.063cm^3  volume. Character of Wound/Ulcer Post Debridement is improved. Severity of Tissue Post Debridement is: Fat layer exposed. Post procedure Diagnosis Wound #3: Same as Pre-Procedure General Notes: Scribed for Dr Celine Ahr by Sharyn Creamer, Rn. Plan Follow-up Appointments: Return Appointment in 1 week. - Dr. Celine Ahr RM 4 Anesthetic: Wound #2 Left,Posterior Lower Leg: (In clinic) Topical Lidocaine 5% applied to wound bed - in clinic, prior to debridement Edema Control - Lymphedema / SCD / Other: Lymphedema Pumps. Use Lymphedema pumps on leg(s) 2-3 times a day for 45-60 minutes. If wearing any wraps or hose, do not remove them. Continue exercising as instructed. - Use x1 per day Avoid standing for long periods of time. Patient to wear own compression stockings every day. - Right lower extremity Moisturize legs daily. The following medication(s) was prescribed: lidocaine topical 5 % ointment ointment topical once daily for prior to debridement was prescribed at facility WOUND #2: - Lower Leg Wound Laterality: Left, Posterior Cleanser: Soap and Water 1 x Per Week/30 Days Discharge Instructions: May shower and wash wound with dial antibacterial soap and water prior to dressing change. Peri-Wound Care: Sween Lotion (Moisturizing lotion) 1 x Per Week/30 Days Discharge Instructions: Apply moisturizing lotion as directed Topical: Keystone 1 x Per Week/30 Days Discharge Instructions: Apply as directed to wound Prim Dressing: Cutimed Sorbact Swab 1 x Per Week/30 Days ary Discharge Instructions: Apply to wound bed as instructed Secondary Dressing: Zetuvit Plus 4x8 in 1 x Per Week/30 Days Discharge Instructions: Apply over primary dressing as directed. Secured With: 59M Medipore Public affairs consultant Surgical T 2x10 (in/yd) 1 x Per Week/30 Days ape Discharge Instructions: Secure with tape as directed. Com pression Wrap: CoFlex TLC XL 2-layer Compression System 4x7 (in/yd) 1 x Per Week/30 Days Discharge Instructions:  Apply Calamine CoFlex 2-layer compression as directed. (alt for 4 layer) WOUND #3: - Ankle Wound Laterality: Right, Lateral Cleanser: Soap and Water 1 x Per Week/30 Days Discharge Instructions: May shower and wash wound with dial antibacterial soap and water prior to dressing change. Peri-Wound Care: Sween Lotion (Moisturizing lotion) 1 x Per Week/30 Days Discharge Instructions: Apply moisturizing lotion as directed Topical: Keystone 1 x Per Week/30 Days Discharge Instructions: Apply as directed to wound Prim Dressing: Cutimed Sorbact Swab 1 x Per Week/30 Days ary Discharge Instructions: Apply to wound bed as instructed Secondary Dressing: Zetuvit Plus 4x8 in 1 x Per Week/30 Days Discharge Instructions: Apply over primary dressing as directed. Secured With: 59M Medipore Public affairs consultant Surgical T 2x10 (in/yd) 1 x Per Week/30 Days ape Discharge Instructions: Secure with tape as directed. Com pression Wrap: CoFlex TLC XL 2-layer Compression System 4x7 (in/yd) 1 x Per Week/30 Days Discharge Instructions: Apply Calamine CoFlex 2-layer compression as directed. (alt for 4 layer) 11/28/2022: Both wounds have slough  and eschar accumulation, as well as adherent silver alginate that was difficult to remove. She continues to have pain out of proportion to the extent of her wounds. I used a curette to debride slough and eschar from her right lateral ankle wound and slough, eschar, and nonviable subcutaneous tissue from her left posterior calf wound. We will continue to use her Keystone topical antibiotic compound to both sites. I am going to change her contact layer to Sorbact to see if we can find something that does not stick as tightly. Continue bilateral 3 layer compression. I am hopeful that her PCP will be able to increase her Lyrica to better address her neuropathic pain. Follow-up in 1 week. Electronic Signature(s) Signed: 11/28/2022 3:17:40 PM By: Fredirick Maudlin MD FACS Entered By: Fredirick Maudlin  on 11/28/2022 15:17:39 [name] ([id number]) [id number] Page 10 of 12 -------------------------------------------------------------------------------- HxROS Details Patient Name: Date of Service: [name], BASHAM 11/28/2022 2:15 PM Medical Record Number: [id number] Patient Account Number: [id number] Date of Birth/Sex: Treating RN: 1946/08/26 (77 y.o. F) Primary Care Provider: [name] Other Clinician: Referring Provider: Treating Provider/Extender: [name] Session in Treatment: 17 Information Obtained From Patient Eyes Medical History: Positive for: Cataracts - L eye Ear/Nose/Mouth/Throat Medical History: Negative for: Chronic sinus problems/congestion; Middle ear problems Hematologic/Lymphatic Medical History: Negative for: Anemia; Human Immunodeficiency Virus; Lymphedema Respiratory Medical History: Positive for: Asthma Past Medical History Notes: OSA on CPAP Cardiovascular Medical History: Positive for: Hypertension Past Medical History Notes: Cronic venous insufficiency 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 Gastrointestinal Medical History: Past Medical History Notes: liver cyst Endocrine Medical History: Negative for: Type I Diabetes; Type II Diabetes Musculoskeletal Medical History: Past Medical History Notes: arthritis Neurologic Medical History: Positive for: Neuropathy - Feet and finger Negative for: Seizure Disorder Past Medical History Notes: restless leg syndrome Oncologic Medical History: Positive for: Received Chemotherapy - 2012; Received Radiation - 2012 RICKELLE, [name] ([id number]) [id number] Page 11 of 12 HBO Extended History Items Eyes: Cataracts Immunizations Pneumococcal Vaccine: Received Pneumococcal Vaccination: Yes Received Pneumococcal Vaccination On or After 60th Birthday: Yes Implantable Devices No devices  added Hospitalization / Surgery History Type of Hospitalization/Surgery Lower extremity venography 2020 Intravascular ultrasound peripheral vascular intervention melanoma 2011 Family and Social History Cancer: Yes - Mother,Paternal Grandparents; Diabetes: Yes - Siblings; Heart Disease: Yes - Father; Hypertension: Yes - Father; Former smoker - quit in [employer]) Signed: 11/28/2022 3:33:25 PM By: Fredirick Maudlin MD FACS Entered By: Fredirick Maudlin on 11/28/2022 15:14:38 -------------------------------------------------------------------------------- SuperBill Details Patient Name: Date of Service: DELLAR, [name] 11/28/2022 Medical Record Number: [id number] Patient Account Number: [id number] Date of Birth/Sex: Treating RN: [birth year] (77 y.o. F) Primary Care Provider: [name] Other Clinician: Referring Provider: Treating Provider/Extender: [name] Session in Treatment: 17 Diagnosis Coding ICD-10 Codes Code Description [phone] Non-pressure chronic ulcer of other part of left lower leg with fat layer exposed L97.818 Non-pressure chronic ulcer of other part of right lower leg with other specified severity I83.893 Varicose veins of bilateral lower extremities with other complications L27.5 Venous insufficiency (chronic) (peripheral) G62.9 Polyneuropathy, unspecified R60.0 Localized edema L03.115 Cellulitis of right lower limb Facility Procedures : CPT4 Code: [id number] Description: 44967 - DEB SUBQ TISSUE 20 SQ CM/< ICD-10 Diagnosis Description L97.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d Modifier: Quantity: 1 : CPT4 Code: [id number] Description: 65993 - DEBRIDE WOUND 1ST 20 SQ CM OR < ICD-10 Diagnosis Description L97.818 Non-pressure chronic ulcer of other  part of right lower leg with other specified sev Modifier: erity Quantity: 1 Physician Procedures : CPT4 Code Description Modifier  ED[place], [place] ([id number]) [id number] - WC PHYS LEVEL 4 - EST PT 25 1 ICD-10 Diagnosis Description L97.822 Non-pressure chronic ulcer of other part of left lower leg with fat layer  exposed L97.818 Non-pressure chronic ulcer of other part of right lower leg with other specified severity I87.2 Venous insufficiency (chronic) (peripheral) G62.9 Polyneuropathy, unspecified Quantity: .pdf Page 12 of 12 : [id number] 11042 - WC PHYS SUBQ TISS 20 SQ CM 1 ICD-10 Diagnosis Description L97.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d Quantity: : [id number] - WC PHYS DEBR WO ANESTH 20 SQ CM 1 ICD-10 Diagnosis Description L97.818 Non-pressure chronic ulcer of other part of right lower leg with other specified severity Quantity: Electronic Signature(s) Signed: 11/28/2022 3:18:01 PM By: Fredirick Maudlin MD FACS Entered By: Fredirick Maudlin on 11/28/2022 15:18:01

## 2022-11-29 ENCOUNTER — Other Ambulatory Visit: Payer: Self-pay | Admitting: *Deleted

## 2022-11-29 DIAGNOSIS — I871 Compression of vein: Secondary | ICD-10-CM

## 2022-11-29 DIAGNOSIS — I872 Venous insufficiency (chronic) (peripheral): Secondary | ICD-10-CM

## 2022-12-05 ENCOUNTER — Encounter (HOSPITAL_BASED_OUTPATIENT_CLINIC_OR_DEPARTMENT_OTHER): Payer: Medicare Other | Admitting: General Surgery

## 2022-12-05 DIAGNOSIS — L97312 Non-pressure chronic ulcer of right ankle with fat layer exposed: Secondary | ICD-10-CM | POA: Diagnosis not present

## 2022-12-05 DIAGNOSIS — L97222 Non-pressure chronic ulcer of left calf with fat layer exposed: Secondary | ICD-10-CM | POA: Diagnosis not present

## 2022-12-05 DIAGNOSIS — L97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d: Secondary | ICD-10-CM | POA: Diagnosis not present

## 2022-12-05 DIAGNOSIS — L97818 Non-pressure chronic ulcer of other part of right lower leg with other specified severity: Secondary | ICD-10-CM | POA: Diagnosis not present

## 2022-12-05 DIAGNOSIS — R600 Localized edema: Secondary | ICD-10-CM | POA: Diagnosis not present

## 2022-12-05 DIAGNOSIS — I872 Venous insufficiency (chronic) (peripheral): Secondary | ICD-10-CM | POA: Diagnosis not present

## 2022-12-05 DIAGNOSIS — I83893 Varicose veins of bilateral lower extremities with other complications: Secondary | ICD-10-CM | POA: Diagnosis not present

## 2022-12-05 DIAGNOSIS — G629 Polyneuropathy, unspecified: Secondary | ICD-10-CM | POA: Diagnosis not present

## 2022-12-06 NOTE — Progress Notes (Signed)
CHIMERE, KLINGENSMITH ([id number]) [id number] Page 1 of 11 Visit Report for 12/05/2022 Chief Complaint Document Details Patient Name: Date of Service: [name], [name] 12/05/2022 1:30 PM Medical Record Number: [id number] Patient Account Number: [id number] Date of Birth/Sex: Treating RN: Dec 29, 1945 (77 y.o. F) Primary Care Provider: [name] Other Clinician: Referring Provider: Treating Provider/Extender: [name] Session in Treatment: 18 Information Obtained from: Patient Chief Complaint Patient presents for treatment of an open ulcer due to venous insufficiency Electronic Signature(s) Signed: 12/05/2022 2:44:39 PM By: Fredirick Maudlin MD FACS Entered By: Fredirick Maudlin on 12/05/2022 14:44:39 -------------------------------------------------------------------------------- Debridement Details Patient Name: Date of Service: [name], [name] 12/05/2022 1:30 PM Medical Record Number: [id number] Patient Account Number: [id number] Date of Birth/Sex: Treating RN: [birth year] (77 y.o. Martyn Malay, [place] Primary Care Provider: [name] Other Clinician: Referring Provider: Treating Provider/Extender: [name] Session in Treatment: 18 Debridement Performed for Assessment: Wound #2 Left,Posterior Lower Leg Performed By: Physician Fredirick Maudlin, MD Debridement Type: Debridement Severity of Tissue Pre Debridement: Fat layer exposed Level of Consciousness (Pre-procedure): Awake and Alert Pre-procedure Verification/Time Out Yes - 14:10 Taken: Start Time: 14:10 Pain Control: Lidocaine 4% T opical Solution T Area Debrided (L x W): otal 5.2 (cm) x 2.5 (cm) = 13 (cm) Tissue and other material debrided: Non-Viable, Eschar, Slough, Slough Level: Non-Viable Tissue Debridement Description: Selective/Open Wound Instrument: Curette Bleeding: Minimum Hemostasis Achieved: Pressure Procedural Pain: 5 Post  Procedural Pain: 2 Response to Treatment: Procedure was tolerated well Level of Consciousness (Post- Awake and Alert procedure): Post Debridement Measurements of Total Wound Length: (cm) 5.2 Width: (cm) 2.5 Depth: (cm) 0.1 Volume: (cm) 1.021 Character of Wound/Ulcer Post Debridement: Improved Severity of Tissue Post Debridement: Fat layer exposed MALAYZIA, LAFORTE ([id number]) [id number] Page 2 of 11 Post Procedure Diagnosis Same as Pre-procedure Notes Scribed for Dr. Celine Ahr by [name], RN Electronic Signature(s) Signed: 12/05/2022 2:55:26 PM By: Fredirick Maudlin MD FACS Signed: 12/05/2022 6:15:21 PM By: [name] RN, [name] Entered By: [name] on 12/05/2022 14:13:04 -------------------------------------------------------------------------------- HPI Details Patient Name: Date of Service: [name], [name] 12/05/2022 1:30 PM Medical Record Number: [id number] Patient Account Number: [id number] Date of Birth/Sex: Treating RN: [birth year] (77 y.o. F) Primary Care Provider: [name] Other Clinician: Referring Provider: Treating Provider/Extender: [name] Session in Treatment: 18 History of Present Illness HPI Description: ADMISSION This is a 77 year old woman with a history of chronic venous insufficiency status post saphenous vein ablations in 2010 and 2016. 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 She presents today with an open venous ulcer on her left lower leg. It has been present for a little over 2 weeks. She saw her primary care provider who apparently swabbed the wound and grew out Pseudomonas. She just completed a course of ciprofloxacin for this. ABI was 0.91. She reports that she has had previous issues with ulcers in this same location, stemming back to a punch biopsy taken by a dermatologist many years ago. She has had several skin substitutes applied to the area that  have ultimately resulted in healing on prior occasions. She has 2 small ulcers on her left medial lower leg. There is slough accumulation in both of them. The more anterior of the 2 is quite small and has some soft slough on the surface, underneath which there is good granulation tissue. The more medial wound also has slough accumulation, but the underlying surface is fairly fibrotic and gritty. This is consistent with her provided history of  multiple ulcers in the same location. 08/03/2022: The anterior wound is smaller today with just a little bit of slough accumulation. The more medial wound continues to be very sensitive and fibrotic with slough buildup. 08/13/2022: The anterior wound is closed. The more medial wound remains sensitive with a fairly fibrotic surface. There is more granulation tissue filling in, however, and there is less slough than on prior occasions. 08/20/2022: The anterior wound remains closed. The more medial wound has filled with granulation tissue but still has a fair amount of slough on the surface and remains fairly tender. Unfortunately, she has developed 2 small ulcers just proximal to this. The fat layer is exposed in each. She says that over the weekend, she felt a burning sensation in that location. 08/27/2022: The 2 small wounds proximal to the main wound have merged into a single site. The wounds look a little bit dry, but they are quite clean without significant slough accumulation. They remain quite tender. 09/03/2022: All of the wounds have now merged into 1 large wound. There is a strong odor coming from the wound and the surface is not particularly viable. It is extremely painful for her today. 09/10/2022: The wound is less black and purple this week but still does not look particularly viable. The surface is desiccated. The culture that I took last week returned with a polymicrobial population including Pseudomonas. I prescribed both Augmentin and levofloxacin.  She has not yet picked up levofloxacin, as her pharmacy only notified her yesterday that it was ready. We have ordered a Keystone topical compounded antibiotic, but it has not yet come. 09/17/2022: The wound surface is markedly improved today. There is still an area of grayish-looking muscle but the rest appears significantly more viable. There is still a layer of slough on the surface. She still has a couple days left of oral antibiotic therapy. She has her [employer] compounded topical antibiotic with her today. 09/24/2022: She has completed her oral antibiotic therapy. The wound surface is much cleaner today and more viable without any necrotic tissue. It is a bit desiccated, however. 10/01/2022: The moisture balance of the wound has improved. There is a layer of yellow slough on the surface, but beneath this, the wound is more pink. 10/08/2022: The wound is smaller and cleaner today with a layer of slough present. She is having less pain. 12/18; this patient has a new wound on the right lateral ankle which apparently started after a cat scratched her. Secondarily she managed to drop a cake pan on the same area. This is very painful. The original wound was on the left posterior calf we have been using [employer] under an [employer]. The Unna boot fell down and apparently she was in for a nurse visit perhaps last week and that 1 fell down as well This patient has central venous mediated hypertension. For member correctly she has a stent placed in the left common iliac artery by Dr. Donzetta Matters some years ago [facility] Thurner syndrome] she also has had venous ablations and I believe a history of a DVT The patient has compression pumps at home but she does not use them KARNA, ABED ([id number]) [id number] Page 3 of 11 10/30/2022: The patient says that her wounds burned all week with the [facility] classic in place. She has a fair amount of slough and nonviable subcutaneous  tissue present on the right ankle. The left posterior calf wound is cleaner than I have seen it to date. Both remain fairly  tender. 11/14/2022: Both wounds have substantial slough and eschar accumulation. They remain extremely tender. 11/21/2022: The wounds are cleaner this week, but still have slough and eschar accumulation. She continues to describe burning pain in both wounds, but upon further questioning, she actually has burning in both of her feet that radiates up her legs and includes to the wounds. 11/28/2022: Both wounds have slough and eschar accumulation, as well as adherent silver alginate that was difficult to remove. She continues to have pain out of proportion to the extent of her wounds. Her PCP did initiate Lyrica which she started taking last night. The dose is quite low, only 25 mg, but apparently there is a plan for upward titration, assuming she tolerates the medication. 12/05/2022: The wounds are both a little bit smaller today, but have significant slough accumulation, as usual. They remain exquisitely tender. She is now taking Lyrica twice a day. Electronic Signature(s) Signed: 12/05/2022 2:45:22 PM By: Fredirick Maudlin MD FACS Entered By: Fredirick Maudlin on 12/05/2022 14:45:22 -------------------------------------------------------------------------------- Physical Exam Details Patient Name: Date of Service: [name], [name] 12/05/2022 1:30 PM Medical Record Number: [id number] Patient Account Number: [id number] Date of Birth/Sex: Treating RN: 1946-06-23 (77 y.o. F) Primary Care Provider: [name] Other Clinician: Referring Provider: Treating Provider/Extender: [name] Session in Treatment: 18 Constitutional Hypertensive, asymptomatic. . . . no acute distress. Respiratory Normal work of breathing on room air. Notes 12/05/2022: The wounds are both a little bit smaller today, but have significant slough accumulation, as usual. They remain  exquisitely tender. Electronic Signature(s) Signed: 12/05/2022 2:45:49 PM By: Fredirick Maudlin MD FACS Entered By: Fredirick Maudlin on 12/05/2022 14:45:49 -------------------------------------------------------------------------------- Physician Orders Details Patient Name: Date of Service: [name], [name] 12/05/2022 1:30 PM Medical Record Number: [id number] Patient Account Number: [id number] Date of Birth/Sex: Treating RN: Nov 09, 1945 (77 y.o. Elam Dutch Primary Care Provider: [name] Other Clinician: Referring Provider: Treating Provider/Extender: [name] Session in Treatment: [phone] Verbal / Phone Orders: No Diagnosis Coding ICD-10 Coding Code Description [phone] Non-pressure chronic ulcer of other part of left lower leg with fat layer exposed L97.818 Non-pressure chronic ulcer of other part of right lower leg with other specified severity I83.893 Varicose veins of bilateral lower extremities with other complications BRIANE, BIRDEN ([id number]) [id number] Page 4 of 11 I87.2 Venous insufficiency (chronic) (peripheral) G62.9 Polyneuropathy, unspecified R60.0 Localized edema L03.115 Cellulitis of right lower limb Follow-up Appointments ppointment in 1 week. - Dr. Celine Ahr RM 3 Return A Wednesday 2/7 @ 1:30 pm Anesthetic Wound #2 Left,Posterior Lower Leg (In clinic) Topical Lidocaine 5% applied to wound bed - in clinic, prior to debridement (In clinic) Topical Lidocaine 4% applied to wound bed Bathing/ Shower/ Hygiene May shower with protection but do not get wound dressing(s) wet. Protect dressing(s) with water repellant cover (for example, large plastic bag) or a cast cover and may then take shower. Edema Control - Lymphedema / SCD / Other Left Lower Extremity Lymphedema Pumps. Use Lymphedema pumps on leg(s) 2-3 times a day for 45-60 minutes. If wearing any wraps or hose, do not remove them. Continue exercising as  instructed. - Use x1 per day Avoid standing for long periods of time. Exercise regularly Wound Treatment Wound #2 - Lower Leg Wound Laterality: Left, Posterior Cleanser: Soap and Water 1 x Per Week/30 Days Discharge Instructions: May shower and wash wound with dial antibacterial soap and water prior to dressing change. Peri-Wound Care: Sween Lotion (Moisturizing lotion) 1 x Per Week/30 Days Discharge Instructions: Apply moisturizing  lotion as directed Topical: Keystone 1 x Per Week/30 Days Discharge Instructions: Apply as directed to wound Prim Dressing: Santyl Ointment 1 x Per Week/30 Days ary Discharge Instructions: Apply nickel thick amount to wound bed as instructed Secondary Dressing: T Non-adherent Dressing, 2x3 in 1 x Per Week/30 Days elfa Discharge Instructions: Apply over primary dressing as directed. Secondary Dressing: Zetuvit Plus 4x8 in 1 x Per Week/30 Days Discharge Instructions: Apply over primary dressing as directed. Secured With: 64M Medipore Public affairs consultant Surgical T 2x10 (in/yd) 1 x Per Week/30 Days ape Discharge Instructions: Secure with tape as directed. Compression Wrap: Unnaboot w/Calamine, 4x10 (in/yd) 1 x Per Week/30 Days Discharge Instructions: Apply Unnaboot as directed. Wound #3 - Ankle Wound Laterality: Right, Lateral Cleanser: Soap and Water 1 x Per Week/30 Days Discharge Instructions: May shower and wash wound with dial antibacterial soap and water prior to dressing change. Peri-Wound Care: Sween Lotion (Moisturizing lotion) 1 x Per Week/30 Days Discharge Instructions: Apply moisturizing lotion as directed Topical: Keystone 1 x Per Week/30 Days Discharge Instructions: Apply as directed to wound Prim Dressing: Santyl Ointment 1 x Per Week/30 Days ary Discharge Instructions: Apply nickel thick amount to wound bed as instructed Secondary Dressing: T Non-adherent Dressing, 2x3 in 1 x Per Week/30 Days elfa Discharge Instructions: Apply over primary dressing  as directed. Secondary Dressing: Woven Gauze Sponge, Non-Sterile 4x4 in 1 x Per Week/30 Days Discharge Instructions: Apply over primary dressing as directed. Secured With: 64M Medipore Public affairs consultant Surgical T 2x10 (in/yd) 1 x Per Week/30 Days ape Discharge Instructions: Secure with tape as directed. Compression Wrap: Unnaboot w/Calamine, 4x10 (in/yd) 1 x Per Week/30 Days Discharge Instructions: Apply Unnaboot as directed. CHANIYAH, JAHR ([id number]) [id number] Page 5 of 11 Electronic Signature(s) Signed: 12/05/2022 2:55:26 PM By: Fredirick Maudlin MD FACS Entered By: Fredirick Maudlin on 12/05/2022 14:46:00 -------------------------------------------------------------------------------- Problem List Details Patient Name: Date of Service: LEOTTA, WEINGARTEN 12/05/2022 1:30 PM Medical Record Number: [id number] Patient Account Number: [id number] Date of Birth/Sex: Treating RN: 1946-02-14 (77 y.o. Elam Dutch Primary Care Provider: [name] Other Clinician: Referring Provider: Treating Provider/Extender: [name] Session in Treatment: 18 Active Problems ICD-10 Encounter Code Description Active Date MDM Diagnosis [phone] Non-pressure chronic ulcer of other part of left lower leg with fat layer exposed9/22/2023 No Yes L97.818 Non-pressure chronic ulcer of other part of right lower leg with other specified 10/22/2022 No Yes severity O13.086 Varicose veins of bilateral lower extremities with other complications 5/78/4696 No Yes I87.2 Venous insufficiency (chronic) (peripheral) 07/27/2022 No Yes G62.9 Polyneuropathy, unspecified 07/27/2022 No Yes R60.0 Localized edema 07/27/2022 No Yes L03.115 Cellulitis of right lower limb 10/22/2022 No Yes Inactive Problems Resolved Problems Electronic Signature(s) Signed: 12/05/2022 2:42:52 PM By: Fredirick Maudlin MD FACS Entered By: Fredirick Maudlin on 12/05/2022 14:42:52 [name], [name] Child  ([id number]) [id number] Page 6 of 11 -------------------------------------------------------------------------------- Progress Note Details Patient Name: Date of Service: JAX, ABDELRAHMAN 12/05/2022 1:30 PM Medical Record Number: [id number] Patient Account Number: [id number] Date of Birth/Sex: Treating RN: [birth year] (77 y.o. F) Primary Care Provider: [name] Other Clinician: Referring Provider: Treating Provider/Extender: [name] Session in Treatment: 18 Subjective Chief Complaint Information obtained from Patient Patient presents for treatment of an open ulcer due to venous insufficiency History of Present Illness (HPI) ADMISSION This is a 77 year old woman with a history of chronic venous insufficiency status post saphenous vein ablations in 2010 and 2016. 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 She presents today with an  open venous ulcer on her left lower leg. It has been present for a little over 2 weeks. She saw her primary care provider who apparently swabbed the wound and grew out Pseudomonas. She just completed a course of ciprofloxacin for this. ABI was 0.91. She reports that she has had previous issues with ulcers in this same location, stemming back to a punch biopsy taken by a dermatologist many years ago. She has had several skin substitutes applied to the area that have ultimately resulted in healing on prior occasions. She has 2 small ulcers on her left medial lower leg. There is slough accumulation in both of them. The more anterior of the 2 is quite small and has some soft slough on the surface, underneath which there is good granulation tissue. The more medial wound also has slough accumulation, but the underlying surface is fairly fibrotic and gritty. This is consistent with her provided history of multiple ulcers in the same location. 08/03/2022: The anterior wound is smaller today  with just a little bit of slough accumulation. The more medial wound continues to be very sensitive and fibrotic with slough buildup. 08/13/2022: The anterior wound is closed. The more medial wound remains sensitive with a fairly fibrotic surface. There is more granulation tissue filling in, however, and there is less slough than on prior occasions. 08/20/2022: The anterior wound remains closed. The more medial wound has filled with granulation tissue but still has a fair amount of slough on the surface and remains fairly tender. Unfortunately, she has developed 2 small ulcers just proximal to this. The fat layer is exposed in each. She says that over the weekend, she felt a burning sensation in that location. 08/27/2022: The 2 small wounds proximal to the main wound have merged into a single site. The wounds look a little bit dry, but they are quite clean without significant slough accumulation. They remain quite tender. 09/03/2022: All of the wounds have now merged into 1 large wound. There is a strong odor coming from the wound and the surface is not particularly viable. It is extremely painful for her today. 09/10/2022: The wound is less black and purple this week but still does not look particularly viable. The surface is desiccated. The culture that I took last week returned with a polymicrobial population including Pseudomonas. I prescribed both Augmentin and levofloxacin. She has not yet picked up levofloxacin, as her pharmacy only notified her yesterday that it was ready. We have ordered a Keystone topical compounded antibiotic, but it has not yet come. 09/17/2022: The wound surface is markedly improved today. There is still an area of grayish-looking muscle but the rest appears significantly more viable. There is still a layer of slough on the surface. She still has a couple days left of oral antibiotic therapy. She has her [employer] compounded topical antibiotic with her today. 09/24/2022:  She has completed her oral antibiotic therapy. The wound surface is much cleaner today and more viable without any necrotic tissue. It is a bit desiccated, however. 10/01/2022: The moisture balance of the wound has improved. There is a layer of yellow slough on the surface, but beneath this, the wound is more pink. 10/08/2022: The wound is smaller and cleaner today with a layer of slough present. She is having less pain. 12/18; this patient has a new wound on the right lateral ankle which apparently started after a cat scratched her. Secondarily she managed to drop a cake pan on the same area. This is very painful.  The original wound was on the left posterior calf we have been using [employer] under an [employer]. The Unna boot fell down and apparently she was in for a nurse visit perhaps last week and that 1 fell down as well This patient has central venous mediated hypertension. For member correctly she has a stent placed in the left common iliac artery by Dr. Donzetta Matters some years ago [facility] Thurner syndrome] she also has had venous ablations and I believe a history of a DVT The patient has compression pumps at home but she does not use them 10/30/2022: The patient says that her wounds burned all week with the [facility] classic in place. She has a fair amount of slough and nonviable subcutaneous tissue present on the right ankle. The left posterior calf wound is cleaner than I have seen it to date. Both remain fairly tender. 11/14/2022: Both wounds have substantial slough and eschar accumulation. They remain extremely tender. 11/21/2022: The wounds are cleaner this week, but still have slough and eschar accumulation. She continues to describe burning pain in both wounds, but upon further questioning, she actually has burning in both of her feet that radiates up her legs and includes to the wounds. 11/28/2022: Both wounds have slough and eschar accumulation, as well as adherent silver alginate that  was difficult to remove. She continues to have pain out of proportion to the extent of her wounds. Her PCP did initiate Lyrica which she started taking last night. The dose is quite low, only 25 mg, but apparently there is a plan for upward titration, assuming she tolerates the medication. 12/05/2022: The wounds are both a little bit smaller today, but have significant slough accumulation, as usual. They remain exquisitely tender. She is now taking Lyrica twice a day. Patient History Information obtained from Patient. SIARA, GORDER ([id number]) [id number] Page 7 of 11 Family History Cancer - Mother,Paternal Grandparents, Diabetes - Siblings, Heart Disease - Father, Hypertension - Father. Social History Former smoker - quit in 1967. Medical History Eyes Patient has history of Cataracts - L eye Ear/Nose/Mouth/Throat Denies history of Chronic sinus problems/congestion, Middle ear problems Hematologic/Lymphatic Denies history of Anemia, Human Immunodeficiency Virus, Lymphedema Respiratory Patient has history of Asthma Cardiovascular Patient has history of Hypertension Endocrine Denies history of Type I Diabetes, Type II Diabetes Neurologic Patient has history of Neuropathy - Feet and finger Denies history of Seizure Disorder Oncologic Patient has history of Received Chemotherapy - 2012, Received Radiation - 2012 Hospitalization/Surgery History - Lower extremity venography 2020. - Intravascular ultrasound. - peripheral vascular intervention. - melanoma 2011. 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 no acute distress. Vitals Time Taken: 1:43 PM, Height: 63 in, Weight: 200 lbs, BMI: 35.4,  Temperature: 97.7 F, Pulse: 77 bpm, Respiratory Rate: 18 breaths/min, Blood Pressure: 154/82 mmHg. Respiratory Normal work of breathing on room air. General Notes: 12/05/2022: The wounds are both a little bit smaller today, but have significant slough accumulation, as usual. They remain exquisitely tender. Integumentary (Hair, Skin) Wound #2 status is Open. Original cause of wound was Blister. The date acquired was: 07/06/2022. The wound has been in treatment 18 weeks. The wound is located on the Left,Posterior Lower Leg. The wound measures 5.2cm length x 2.5cm width x 0.1cm depth; 10.21cm^2 area and 1.021cm^3 volume. There is Fat Layer (Subcutaneous Tissue) exposed. There is  no tunneling or undermining noted. There is a medium amount of serosanguineous drainage noted. The wound margin is flat and intact. There is small (1-33%) red granulation within the wound bed. There is a large (67-100%) amount of necrotic tissue within the wound bed including Eschar and Adherent Slough. The periwound skin appearance had no abnormalities noted for texture. The periwound skin appearance had no abnormalities noted for moisture. The periwound skin appearance had no abnormalities noted for color. Periwound temperature was noted as No Abnormality. The periwound has tenderness on palpation. Wound #3 status is Open. Original cause of wound was Contusion/Bruise. The date acquired was: 10/19/2022. The wound has been in treatment 6 weeks. The wound is located on the Right,Lateral Ankle. The wound measures 1cm length x 0.9cm width x 0.1cm depth; 0.707cm^2 area and 0.071cm^3 volume. There is Fat Layer (Subcutaneous Tissue) exposed. There is no tunneling or undermining noted. There is a medium amount of serosanguineous drainage noted. The wound margin is flat and intact. There is medium (34-66%) granulation within the wound bed. There is a medium (34-66%) amount of necrotic tissue within the wound bed including Adherent  Slough. The periwound skin appearance had no abnormalities noted for texture. The periwound skin appearance had no abnormalities noted for moisture. The periwound skin appearance did not exhibit: Erythema. Periwound temperature was noted as No Abnormality. The periwound has tenderness on palpation. Assessment Active Problems ICD-10 Non-pressure chronic ulcer of other part of left lower leg with fat layer exposed REGHAN, THUL ([id number]) [id number] Page 8 of 11 Non-pressure chronic ulcer of other part of right lower leg with other specified severity Varicose veins of bilateral lower extremities with other complications Venous insufficiency (chronic) (peripheral) Polyneuropathy, unspecified Localized edema Cellulitis of right lower limb Procedures Wound #2 Pre-procedure diagnosis of Wound #2 is a Venous Leg Ulcer located on the Left,Posterior Lower Leg .Severity of Tissue Pre Debridement is: Fat layer exposed. There was a Selective/Open Wound Non-Viable Tissue Debridement with a total area of 13 sq cm performed by Fredirick Maudlin, MD. With the following instrument(s): Curette to remove Non-Viable tissue/material. Material removed includes Eschar and Slough and after achieving pain control using Lidocaine 4% T opical Solution. No specimens were taken. A time out was conducted at 14:10, prior to the start of the procedure. A Minimum amount of bleeding was controlled with Pressure. The procedure was tolerated well with a pain level of 5 throughout and a pain level of 2 following the procedure. Post Debridement Measurements: 5.2cm length x 2.5cm width x 0.1cm depth; 1.021cm^3 volume. Character of Wound/Ulcer Post Debridement is improved. Severity of Tissue Post Debridement is: Fat layer exposed. Post procedure Diagnosis Wound #2: Same as Pre-Procedure General Notes: Scribed for Dr. Celine Ahr by [name], RN. Pre-procedure diagnosis of Wound #2 is a Venous Leg  Ulcer located on the Left,Posterior Lower Leg . There was a Haematologist Compression Therapy Procedure by [name], RN. Post procedure Diagnosis Wound #2: Same as Pre-Procedure Wound #3 Pre-procedure diagnosis of Wound #3 is a Venous Leg Ulcer located on the Right,Lateral Ankle . There was a Haematologist Compression Therapy Procedure by [name], RN. Post procedure Diagnosis Wound #3: Same as Pre-Procedure Plan Follow-up Appointments: Return Appointment in 1 week. - Dr. Celine Ahr RM 3 Wednesday 2/7 @ 1:30 pm Anesthetic: Wound #2 Left,Posterior Lower Leg: (In clinic) Topical Lidocaine 5% applied to wound bed - in clinic, prior to debridement (In clinic) Topical Lidocaine 4% applied to wound bed Bathing/ Shower/ Hygiene: May shower with  protection but do not get wound dressing(s) wet. Protect dressing(s) with water repellant cover (for example, large plastic bag) or a cast cover and may then take shower. Edema Control - Lymphedema / SCD / Other: Lymphedema Pumps. Use Lymphedema pumps on leg(s) 2-3 times a day for 45-60 minutes. If wearing any wraps or hose, do not remove them. Continue exercising as instructed. - Use x1 per day Avoid standing for long periods of time. Exercise regularly WOUND #2: - Lower Leg Wound Laterality: Left, Posterior Cleanser: Soap and Water 1 x Per Week/30 Days Discharge Instructions: May shower and wash wound with dial antibacterial soap and water prior to dressing change. Peri-Wound Care: Sween Lotion (Moisturizing lotion) 1 x Per Week/30 Days Discharge Instructions: Apply moisturizing lotion as directed Topical: Keystone 1 x Per Week/30 Days Discharge Instructions: Apply as directed to wound Prim Dressing: Santyl Ointment 1 x Per Week/30 Days ary Discharge Instructions: Apply nickel thick amount to wound bed as instructed Secondary Dressing: T Non-adherent Dressing, 2x3 in 1 x Per Week/30 Days elfa Discharge Instructions: Apply over primary dressing  as directed. Secondary Dressing: Zetuvit Plus 4x8 in 1 x Per Week/30 Days Discharge Instructions: Apply over primary dressing as directed. Secured With: 82M Medipore Public affairs consultant Surgical T 2x10 (in/yd) 1 x Per Week/30 Days ape Discharge Instructions: Secure with tape as directed. Com pression Wrap: Unnaboot w/Calamine, 4x10 (in/yd) 1 x Per Week/30 Days Discharge Instructions: Apply Unnaboot as directed. WOUND #3: - Ankle Wound Laterality: Right, Lateral Cleanser: Soap and Water 1 x Per Week/30 Days Discharge Instructions: May shower and wash wound with dial antibacterial soap and water prior to dressing change. Peri-Wound Care: Sween Lotion (Moisturizing lotion) 1 x Per Week/30 Days Discharge Instructions: Apply moisturizing lotion as directed Topical: Keystone 1 x Per Week/30 Days Discharge Instructions: Apply as directed to wound Prim Dressing: Santyl Ointment 1 x Per Week/30 Days ary Discharge Instructions: Apply nickel thick amount to wound bed as instructed Secondary Dressing: T Non-adherent Dressing, 2x3 in 1 x Per Week/30 Days elfa Discharge Instructions: Apply over primary dressing as directed. Secondary Dressing: Woven Gauze Sponge, Non-Sterile 4x4 in 1 x Per Week/30 Days Discharge Instructions: Apply over primary dressing as directed. Secured With: 82M Medipore Public affairs consultant Surgical T 2x10 (in/yd) 1 x Per Week/30 Days ape Discharge Instructions: Secure with tape as directed. Com pression Wrap: Unnaboot w/Calamine, 4x10 (in/yd) 1 x Per Week/30 Days Discharge Instructions: Apply Unnaboot as directed. SHERRYE, PUGA ([id number]) [id number] Page 9 of 11 12/05/2022: The wounds are both a little bit smaller today, but have significant slough accumulation, as usual. They remain exquisitely tender. The right lateral ankle wound was so tender that she would not permit debridement today. I was able to use a curette to debride slough from the left posterior calf  wound. I am going to add Santyl to her [employer] topical antibiotic compound and apply this to both sites to try and enzymatically debride the wounds as she simply cannot tolerate an appropriate and thorough sharp debridement in clinic. We will apply bilateral Unna boots and she will follow-up in 1 week. Electronic Signature(s) Signed: 12/05/2022 2:47:00 PM By: Fredirick Maudlin MD FACS Entered By: Fredirick Maudlin on 12/05/2022 14:47:00 -------------------------------------------------------------------------------- HxROS Details Patient Name: Date of Service: [name], MORDAN 12/05/2022 1:30 PM Medical Record Number: [id number] Patient Account Number: [id number] Date of Birth/Sex: Treating RN: 09-15-1946 (77 y.o. F) Primary Care Provider: [name] Other Clinician: Referring Provider: Treating Provider/Extender: [name] Session in Treatment: [phone] Information  Obtained From Patient Eyes Medical History: Positive for: Cataracts - L eye Ear/Nose/Mouth/Throat Medical History: Negative for: Chronic sinus problems/congestion; Middle ear problems Hematologic/Lymphatic Medical History: Negative for: Anemia; Human Immunodeficiency Virus; Lymphedema Respiratory Medical History: Positive for: Asthma Past Medical History Notes: OSA on CPAP Cardiovascular Medical History: Positive for: Hypertension Past Medical History Notes: Cronic venous insufficiency 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 Gastrointestinal Medical History: Past Medical History Notes: liver cyst Endocrine Medical History: Negative for: Type I Diabetes; Type II Diabetes EMER, [name] ([id number]) [id number] Page 10 of 11 Musculoskeletal Medical History: Past Medical History Notes: arthritis Neurologic Medical History: Positive for: Neuropathy - Feet and finger Negative for: Seizure Disorder Past Medical History Notes: restless leg  syndrome Oncologic Medical History: Positive for: Received Chemotherapy - 2012; Received Radiation - 2012 HBO Extended History Items Eyes: Cataracts Immunizations Pneumococcal Vaccine: Received Pneumococcal Vaccination: Yes Received Pneumococcal Vaccination On or After 60th Birthday: Yes Implantable Devices No devices added Hospitalization / Surgery History Type of Hospitalization/Surgery Lower extremity venography 2020 Intravascular ultrasound peripheral vascular intervention melanoma 2011 Family and Social History Cancer: Yes - Mother,Paternal Grandparents; Diabetes: Yes - Siblings; Heart Disease: Yes - Father; Hypertension: Yes - Father; Former smoker - quit in [employer]) Signed: 12/05/2022 2:55:26 PM By: Fredirick Maudlin MD FACS Entered By: Fredirick Maudlin on 12/05/2022 14:45:28 -------------------------------------------------------------------------------- SuperBill Details Patient Name: Date of Service: [name], [name] 12/05/2022 Medical Record Number: [id number] Patient Account Number: [id number] Date of Birth/Sex: Treating RN: June 09, 1946 (77 y.o. F) Primary Care Provider: [name] Other Clinician: Referring Provider: Treating Provider/Extender: [name] Session in Treatment: 18 Diagnosis Coding ICD-10 Codes Code Description [phone] Non-pressure chronic ulcer of other part of left lower leg with fat layer exposed L97.818 Non-pressure chronic ulcer of other part of right lower leg with other specified severity I83.893 Varicose veins of bilateral lower extremities with other complications U72.5 Venous insufficiency (chronic) (peripheral) G62.9 Polyneuropathy, unspecified AIRLIE, BLUMENBERG ([id number]) [id number] Page 11 of 11 R60.0 Localized edema L03.115 Cellulitis of right lower limb Facility Procedures : CPT4 Code: [id number] Description: 75643 - DEBRIDE WOUND 1ST 20 SQ CM OR < ICD-10  Diagnosis Description L97.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d Modifier: Quantity: 1 : CPT4 Code: 32951884 Description: (Facility Use Only) [id number] - APPLY UNNA BOOT RT Modifier: 21 Quantity: 1 Physician Procedures : CPT4 Code Description Modifier [id number] 23557 - WC PHYS LEVEL 4 - EST PT 25 ICD-10 Diagnosis Description L97.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d L97.818 Non-pressure chronic ulcer of other part of right  lower leg with other specified severity I87.2 Venous insufficiency (chronic) (peripheral) G62.9 Polyneuropathy, unspecified Quantity: 1 : [id number] - WC PHYS DEBR WO ANESTH 20 SQ CM ICD-10 Diagnosis Description L97.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d Quantity: 1 Electronic Signature(s) Signed: 12/05/2022 6:15:21 PM By: [name] RN, [name] Signed: 12/06/2022 8:17:04 AM By: Fredirick Maudlin MD FACS Previous Signature: 12/05/2022 2:47:22 PM Version By: Fredirick Maudlin MD FACS Entered By: [name] on 12/05/2022 17:35:35

## 2022-12-06 NOTE — Progress Notes (Signed)
[name], [name] ([id number]) [id number] Page 1 of 10 Visit Report for 12/05/2022 Arrival Information Details Patient Name: Date of Service: [name], [name] 12/05/2022 1:30 PM Medical Record Number: [id number] Patient Account Number: [id number] Date of Birth/Sex: Treating RN: [name] (77 y.o. [name] Primary Care Everet Flagg: [name] Other Clinician: Referring Latessa Tillis: Treating Herald Vallin/Extender: [name] Session in Treatment: 18 Visit Information History Since Last Visit Added or deleted any medications: Yes Patient Arrived: Ambulatory Any new allergies or adverse reactions: No Arrival Time: 13:35 Had a fall or experienced change in No Accompanied By: self activities of daily living that may affect Transfer Assistance: None risk of falls: Patient Identification Verified: Yes Signs or symptoms of abuse/neglect since last visito No Secondary Verification Process Completed: Yes Hospitalized since last visit: No Patient Requires Transmission-Based Precautions: No Implantable device outside of the clinic excluding No Patient Has Alerts: No cellular tissue based products placed in the center since last visit: Has Dressing in Place as Prescribed: Yes Has Compression in Place as Prescribed: Yes Pain Present Now: Yes Electronic Signature(s) Signed: 12/05/2022 6:15:21 PM By: [name] RN, [name] Entered By: [name] on 12/05/2022 13:40:34 -------------------------------------------------------------------------------- Compression Therapy Details Patient Name: Date of Service: [name], [name] 12/05/2022 1:30 PM Medical Record Number: [id number] Patient Account Number: [id number] Date of Birth/Sex: Treating RN: [name] (77 y.o. [name] Primary Care Rhilee Currin: [name] Other Clinician: Referring Deakon Frix: Treating Lashuna Tamashiro/Extender: [name] Session in Treatment:  18 Compression Therapy Performed for Wound Assessment: Wound #2 Left,Posterior Lower Leg Performed By: Clinician [name], RN Compression Type: Rolena Infante Post Procedure Diagnosis Same as Pre-procedure Electronic Signature(s) Signed: 12/05/2022 6:15:21 PM By: [name] RN, [name] Entered By: [name] on 12/05/2022 14:10:30 [name] ([id number]) [id number] Page 2 of 10 -------------------------------------------------------------------------------- Compression Therapy Details Patient Name: Date of Service: [name], [name] 12/05/2022 1:30 PM Medical Record Number: [id number] Patient Account Number: [id number] Date of Birth/Sex: Treating RN: [name] (77 y.o. [name] Primary Care Nowell Sites: [name] Other Clinician: Referring Shilo Pauwels: Treating Brittony Billick/Extender: [name] Session in Treatment: 18 Compression Therapy Performed for Wound Assessment: Wound #3 Right,Lateral Ankle Performed By: Clinician [name], RN Compression Type: Rolena Infante Post Procedure Diagnosis Same as Pre-procedure Electronic Signature(s) Signed: 12/05/2022 6:15:21 PM By: [name] RN, [name] Entered By: [name] on 12/05/2022 14:10:30 -------------------------------------------------------------------------------- Encounter Discharge Information Details Patient Name: Date of Service: [name], [name] 12/05/2022 1:30 PM Medical Record Number: [id number] Patient Account Number: [id number] Date of Birth/Sex: Treating RN: [name] (78 y.o. [name] Primary Care Octavion Mollenkopf: [name] Other Clinician: Referring Latoia Eyster: Treating Tandy Grawe/Extender: [name] Session in Treatment: 18 Encounter Discharge Information Items Post Procedure Vitals Discharge Condition: Stable Temperature (F): 97.7 Ambulatory Status: Ambulatory Pulse (bpm): 77 Discharge Destination:  Home Respiratory Rate (breaths/min): 18 Transportation: Private Auto Blood Pressure (mmHg): 154/82 Accompanied By: self Schedule Follow-up Appointment: Yes Clinical Summary of Care: Patient Declined Electronic Signature(s) Signed: 12/05/2022 6:15:21 PM By: [name] RN, [name] Entered By: [name] on 12/05/2022 14:37:09 -------------------------------------------------------------------------------- Lower Extremity Assessment Details Patient Name: Date of Service: [name], [name] 12/05/2022 1:30 PM Medical Record Number: [id number] Patient Account Number: [id number] Date of Birth/Sex: Treating RN: 13-Amber-[name] (77 y.o. [name] Primary Care Dafina Suk: [name] Other Clinician: Referring Baneen Wieseler: Treating Odessa Nishi/Extender: Pixie Casino Weeks in Treatment: 18 Edema Assessment Assessed: [name]: No] [name]: No] Edema: [Left: Ye] [Right: s] Calf [name] ([id number]) [id number] Page 3 of  10 Left: Right: Point of Measurement: From Medial Instep 38.3 cm 32.5 cm Ankle Left: Right: Point of Measurement: From Medial Instep 23.3 cm 22.5 cm Vascular Assessment Pulses: Dorsalis Pedis Palpable: [Left:No] [Right:No] Electronic Signature(s) Signed: 12/05/2022 6:15:21 PM By: [name] RN, [name] Entered By: [name] on 12/05/2022 14:00:16 -------------------------------------------------------------------------------- Multi Wound Chart Details Patient Name: Date of Service: [name]. 12/05/2022 1:30 PM Medical Record Number: [id number] Patient Account Number: [id number] Date of Birth/Sex: Treating RN: 07-Amber-[name] (77 y.o. F) Primary Care Ilissa Rosner: [name] Other Clinician: Referring Evaluna Utke: Treating Curtis Cain/Extender: [name] Session in Treatment: 18 Vital Signs Height(in): 63 Pulse(bpm): 84 Weight(lbs): 200 Blood Pressure(mmHg): 154/82 Body Mass  Index(BMI): 35.4 Temperature(F): 97.7 Respiratory Rate(breaths/min): 18 [2:Photos:] [N/A:N/A] Left, Posterior Lower Leg Right, Lateral Ankle N/A Wound Location: Blister Contusion/Bruise N/A Wounding Event: Venous Leg Ulcer Venous Leg Ulcer N/A Primary Etiology: Cataracts, Asthma, Hypertension, Cataracts, Asthma, Hypertension, N/A Comorbid History: Neuropathy, Received Chemotherapy, Neuropathy, Received Chemotherapy, Received Radiation Received Radiation 07/06/2022 10/19/2022 N/A Date Acquired: 18 6 N/A Weeks of Treatment: Open Open N/A Wound Status: No No N/A Wound Recurrence: 5.2x2.5x0.1 1x0.9x0.1 N/A Measurements L x W x D (cm) 10.21 0.707 N/A A (cm) : rea 1.021 0.071 N/A Volume (cm) : -48.60% 73.50% N/A % Reduction in Area: -48.60% 73.40% N/A % Reduction in Volume: Full Thickness Without Exposed Full Thickness Without Exposed N/A Classification: Support Structures Support Structures Medium Medium N/A Exudate Amount: Serosanguineous Serosanguineous N/A Exudate Type: red, brown red, brown N/A Exudate Color: Flat and Intact Flat and Intact N/A Wound Margin: Small (1-33%) Medium (34-66%) N/A Granulation Amount: Red N/A N/A Granulation QualityILLYANA, SCHORSCH ([id number]) [id number] Page 4 of 10 Large (67-100%) Medium (34-66%) N/A Necrotic Amount: Eschar, Adherent [facility] N/A Necrotic Tissue: Fat Layer (Subcutaneous Tissue): Yes Fat Layer (Subcutaneous Tissue): Yes N/A Exposed Structures: Fascia: No Fascia: No Tendon: No Tendon: No Muscle: No Muscle: No Joint: No Joint: No Bone: No Bone: No Small (1-33%) Small (1-33%) N/A Epithelialization: Debridement - Selective/Open Wound N/A N/A Debridement: Pre-procedure Verification/Time Out 14:10 N/A N/A Taken: Lidocaine 4% Topical Solution N/A N/A Pain Control: Necrotic/Eschar, Slough N/A N/A Tissue Debrided: Non-Viable Tissue N/A N/A Level: 13 N/A  N/A Debridement A (sq cm): rea Curette N/A N/A Instrument: Minimum N/A N/A Bleeding: Pressure N/A N/A Hemostasis A chieved: 5 N/A N/A Procedural Pain: 2 N/A N/A Post Procedural Pain: Procedure was tolerated well N/A N/A Debridement Treatment Response: 5.2x2.5x0.1 N/A N/A Post Debridement Measurements L x W x D (cm) 1.021 N/A N/A Post Debridement Volume: (cm) Excoriation: No Excoriation: Yes N/A Periwound Skin Texture: Maceration: No Maceration: Yes N/A Periwound Skin Moisture: No Abnormalities Noted Erythema: No N/A Periwound Skin Color: No Abnormality No Abnormality N/A Temperature: Yes Yes N/A Tenderness on Palpation: Compression Therapy Compression Therapy N/A Procedures Performed: Debridement Treatment Notes Wound #2 (Lower Leg) Wound Laterality: Left, Posterior Cleanser Soap and Water Discharge Instruction: May shower and wash wound with dial antibacterial soap and water prior to dressing change. Peri-Wound Care Sween Lotion (Moisturizing lotion) Discharge Instruction: Apply moisturizing lotion as directed Topical Keystone Discharge Instruction: Apply as directed to wound Primary Dressing Santyl Ointment Discharge Instruction: Apply nickel thick amount to wound bed as instructed Secondary Dressing T Non-adherent Dressing, 2x3 in elfa Discharge Instruction: Apply over primary dressing as directed. Zetuvit Plus 4x8 in Discharge Instruction: Apply over primary dressing as directed. Secured With [employer] Surgical T 2x10 (in/yd) ape Discharge Instruction: Secure with tape as directed. Compression Wrap Unnaboot  w/Calamine, 4x10 (in/yd) Discharge Instruction: Apply Unnaboot as directed. Compression Stockings Add-Ons Wound #3 (Ankle) Wound Laterality: Right, Lateral Cleanser Soap and Water Discharge Instruction: May shower and wash wound with dial antibacterial soap and water prior to dressing change. Peri-Wound Care ADAMARIZ, GILLOTT  ([id number]) [id number] Page 5 of 10 Sween Lotion (Moisturizing lotion) Discharge Instruction: Apply moisturizing lotion as directed Topical Keystone Discharge Instruction: Apply as directed to wound Primary Dressing Santyl Ointment Discharge Instruction: Apply nickel thick amount to wound bed as instructed Secondary Dressing T Non-adherent Dressing, 2x3 in elfa Discharge Instruction: Apply over primary dressing as directed. Woven Gauze Sponge, Non-Sterile 4x4 in Discharge Instruction: Apply over primary dressing as directed. Secured With [employer] Surgical T 2x10 (in/yd) ape Discharge Instruction: Secure with tape as directed. Compression Wrap Unnaboot w/Calamine, 4x10 (in/yd) Discharge Instruction: Apply Unnaboot as directed. Compression Stockings Add-Ons Electronic Signature(s) Signed: 12/05/2022 2:44:32 PM By: Fredirick Maudlin MD FACS Entered By: Fredirick Maudlin on 12/05/2022 14:44:32 -------------------------------------------------------------------------------- Multi-Disciplinary Care Plan Details Patient Name: Date of Service: [name], [name] 12/05/2022 1:30 PM Medical Record Number: [id number] Patient Account Number: [id number] Date of Birth/Sex: Treating RN: [birth year] (77 y.o. [name] Primary Care Lenise Jr: [name] Other Clinician: Referring Surya Folden: Treating Krystie Leiter/Extender: [name] Session in Treatment: 18 Active Inactive Wound/Skin Impairment Nursing Diagnoses: Impaired tissue integrity Goals: Ulcer/skin breakdown will have a volume reduction of 30% by week 4 Date Initiated: 08/03/2022 Date Inactivated: 09/10/2022 Target Resolution Date: 08/31/2022 Unmet Reason: uncontrolled edema, Goal Status: Unmet acute infection Ulcer/skin breakdown will have a volume reduction of 50% by week 8 Date Initiated: 09/10/2022 Target Resolution Date: 04/05/2023 Goal Status:  Active Interventions: Assess ulceration(s) every visit Treatment Activities: Skin care regimen initiated : 08/03/2022 DAVIA, SMYRE ([id number]) [phone].pdf Page 6 of 10 Notes: Keystone ordered. 10/30 RX Levo started. 09/10/22 Electronic Signature(s) Signed: 12/05/2022 6:15:21 PM By: [name] RN, [name] Entered By: [name] on 12/05/2022 14:04:49 -------------------------------------------------------------------------------- Pain Assessment Details Patient Name: Date of Service: [name], [name] 12/05/2022 1:30 PM Medical Record Number: [id number] Patient Account Number: [id number] Date of Birth/Sex: Treating RN: [birth year] [name] (77 y.o. [name] Primary Care Karinne Schmader: [name] Other Clinician: Referring Dondra Rhett: Treating Sumayah Bearse/Extender: [name] Session in Treatment: 18 Active Problems Location of Pain Severity and Description of Pain Patient Has Paino Yes Site Locations Pain Location: Pain in Ulcers With Dressing Change: Yes Duration of the Pain. Constant / Intermittento Intermittent Rate the pain. Current Pain Level: 2 Least Pain Level: 0 Character of Pain Describe the Pain: Burning Pain Management and Medication Current Pain Management: Medication: Yes Is the Current Pain Management Adequate: Adequate How does your wound impact your activities of daily livingo Sleep: No Bathing: No Appetite: No Relationship With Others: No Bladder Continence: No Emotions: No Bowel Continence: No Work: No Toileting: No Drive: No Dressing: No Hobbies: No Electronic Signature(s) Signed: 12/05/2022 6:15:21 PM By: [name] RN, [name] Entered By: [name] on 12/05/2022 13:41:34 [name] ([id number]) [id number] Page 7 of 10 -------------------------------------------------------------------------------- Patient/Caregiver Education Details Patient Name: Date of  Service: [name], [name] 1/31/2024andnbsp1:30 PM Medical Record Number: [id number] Patient Account Number: [id number] Date of Birth/Gender: Treating RN: [birth year] (77 y.o. [name] Primary Care Physician: [name] Other Clinician: Referring Physician: Treating Physician/Extender: [name] Session in Treatment: 18 Education Assessment Education Provided To: Patient Education Topics Provided Venous: Methods: Explain/Verbal Responses: Reinforcements needed, State content correctly Electronic Signature(s) Signed: 12/05/2022 6:15:21 PM By: [name] RN,  [name] Entered By: [name] on 12/05/2022 14:05:07 -------------------------------------------------------------------------------- Wound Assessment Details Patient Name: Date of Service: [name], [name] 12/05/2022 1:30 PM Medical Record Number: [id number] Patient Account Number: [id number] Date of Birth/Sex: Treating RN: [birth year] [name] (77 y.o. Martyn Malay, Shalinda Primary Care Marium Ragan: [name] Other Clinician: Referring Auden Tatar: Treating Mohamedamin Nifong/Extender: Pixie Casino Weeks in Treatment: 18 Wound Status Wound Number: 2 Primary Venous Leg Ulcer Etiology: Wound Location: Left, Posterior Lower Leg Wound Open Wounding Event: Blister Status: Date Acquired: 07/06/2022 Comorbid Cataracts, Asthma, Hypertension, Neuropathy, Received Weeks Of Treatment: 18 History: Chemotherapy, Received Radiation Clustered Wound: No Photos Wound Measurements Length: (cm) 5.2 Width: (cm) 2.5 Depth: (cm) 0.1 Area: (cm) 10.21 Volume: (cm) 1.021 % Reduction in Area: -48.6% % Reduction in Volume: -48.6% Epithelialization: Small (1-33%) Tunneling: No Undermining: No Wound Description ELLORY, KHURANA ([id number]) Classification: Full Thickness Without Exposed Support Structures Wound Margin: Flat and Intact Exudate Amount: Medium Exudate Type: Serosanguineous Exudate  Color: red, brown [id number] Page 8 of 10 Foul Odor After Cleansing: No Slough/Fibrino Yes Wound Bed Granulation Amount: Small (1-33%) Exposed Structure Granulation Quality: Red Fascia Exposed: No Necrotic Amount: Large (67-100%) Fat Layer (Subcutaneous Tissue) Exposed: Yes Necrotic Quality: Eschar, Adherent Slough Tendon Exposed: No Muscle Exposed: No Joint Exposed: No Bone Exposed: No Periwound Skin Texture Texture Color No Abnormalities Noted: Yes No Abnormalities Noted: Yes Moisture Temperature / Pain No Abnormalities Noted: Yes Temperature: No Abnormality Tenderness on Palpation: Yes Treatment Notes Wound #2 (Lower Leg) Wound Laterality: Left, Posterior Cleanser Soap and Water Discharge Instruction: May shower and wash wound with dial antibacterial soap and water prior to dressing change. Peri-Wound Care Sween Lotion (Moisturizing lotion) Discharge Instruction: Apply moisturizing lotion as directed Topical Keystone Discharge Instruction: Apply as directed to wound Primary Dressing Santyl Ointment Discharge Instruction: Apply nickel thick amount to wound bed as instructed Secondary Dressing T Non-adherent Dressing, 2x3 in elfa Discharge Instruction: Apply over primary dressing as directed. Zetuvit Plus 4x8 in Discharge Instruction: Apply over primary dressing as directed. Secured With [employer] Surgical T 2x10 (in/yd) ape Discharge Instruction: Secure with tape as directed. Compression Wrap Unnaboot w/Calamine, 4x10 (in/yd) Discharge Instruction: Apply Unnaboot as directed. Compression Stockings Add-Ons Electronic Signature(s) Signed: 12/05/2022 6:15:21 PM By: [name] RN, [name] Entered By: [name] on 12/05/2022 14:03:40 Wound Assessment Details -------------------------------------------------------------------------------- [name] ([id number]) [id number] Page 9 of  10 Patient Name: Date of Service: [name], [name] 12/05/2022 1:30 PM Medical Record Number: [id number] Patient Account Number: [id number] Date of Birth/Sex: Treating RN: [birth year] (77 y.o. Martyn Malay, Natasia Primary Care Lafreda Casebeer: [name] Other Clinician: Referring Gearline Spilman: Treating Dashawn Bartnick/Extender: Pixie Casino Weeks in Treatment: 18 Wound Status Wound Number: 3 Primary Venous Leg Ulcer Etiology: Wound Location: Right, Lateral Ankle Wound Open Wounding Event: Contusion/Bruise Status: Date Acquired: 10/19/2022 Comorbid Cataracts, Asthma, Hypertension, Neuropathy, Received Weeks Of Treatment: 6 History: Chemotherapy, Received Radiation Clustered Wound: No Photos Wound Measurements Length: (cm) 1 % Reduction in Area: 73.5% Width: (cm) 0.9 % Reduction in Volume: 73.4% Depth: (cm) 0.1 Epithelialization: Small (1-33%) Area: (cm) 0.707 Tunneling: No Volume: (cm) 0.071 Undermining: No Wound Description Classification: Full Thickness Without Exposed Support Structures Foul Odor After Cleansing: No Wound Margin: Flat and Intact Slough/Fibrino Yes Exudate Amount: Medium Exudate Type: Serosanguineous Exudate Color: red, brown Wound Bed Granulation Amount: Medium (34-66%) Exposed Structure Necrotic Amount: Medium (34-66%) Fascia Exposed: No Necrotic Quality: Adherent Slough Fat Layer (Subcutaneous Tissue) Exposed: Yes Tendon Exposed: No Muscle Exposed: No Joint Exposed: No Bone  Exposed: No Periwound Skin Texture Texture Color No Abnormalities Noted: Yes No Abnormalities Noted: No Erythema: No Moisture No Abnormalities Noted: Yes Temperature / Pain Temperature: No Abnormality Tenderness on Palpation: Yes Treatment Notes Wound #3 (Ankle) Wound Laterality: Right, Lateral Cleanser Soap and Water Discharge Instruction: May shower and wash wound with dial antibacterial soap and water prior to dressing change. Peri-Wound Care Sween  Lotion (Moisturizing lotion) Discharge Instruction: Apply moisturizing lotion as directed Topical [name], [name] ([id number]) [phone].pdf Page 10 of [address] Discharge Instruction: Apply as directed to wound Primary Dressing Santyl Ointment Discharge Instruction: Apply nickel thick amount to wound bed as instructed Secondary Dressing T Non-adherent Dressing, 2x3 in elfa Discharge Instruction: Apply over primary dressing as directed. Woven Gauze Sponge, Non-Sterile 4x4 in Discharge Instruction: Apply over primary dressing as directed. Secured With [employer] Surgical T 2x10 (in/yd) ape Discharge Instruction: Secure with tape as directed. Compression Wrap Unnaboot w/Calamine, 4x10 (in/yd) Discharge Instruction: Apply Unnaboot as directed. Compression Stockings Add-Ons Electronic Signature(s) Signed: 12/05/2022 6:15:21 PM By: [name] RN, [name] Entered By: [name] on 12/05/2022 14:04:38 -------------------------------------------------------------------------------- Vitals Details Patient Name: Date of Service: RUSHIE, BRAZEL 12/05/2022 1:30 PM Medical Record Number: [id number] Patient Account Number: [id number] Date of Birth/Sex: Treating RN: [name] (77 y.o. [name] Primary Care Michella Detjen: [name] Other Clinician: Referring Joelene Barriere: Treating Langley Ingalls/Extender: [name] Session in Treatment: 18 Vital Signs Time Taken: 13:43 Temperature (F): 97.7 Height (in): 63 Pulse (bpm): 77 Weight (lbs): 200 Respiratory Rate (breaths/min): 18 Body Mass Index (BMI): 35.4 Blood Pressure (mmHg): 154/82 Reference Range: 80 - 120 mg / dl Electronic Signature(s) Signed: 12/05/2022 6:15:21 PM By: [name] RN, [name] Entered By: [name] on 12/05/2022 13:43:44

## 2022-12-11 ENCOUNTER — Ambulatory Visit: Payer: Medicare Other | Admitting: Podiatry

## 2022-12-11 ENCOUNTER — Ambulatory Visit (INDEPENDENT_AMBULATORY_CARE_PROVIDER_SITE_OTHER): Payer: Medicare Other | Admitting: Podiatry

## 2022-12-11 DIAGNOSIS — M7752 Other enthesopathy of left foot: Secondary | ICD-10-CM | POA: Diagnosis not present

## 2022-12-11 NOTE — Progress Notes (Signed)
Subjective:  Patient ID: [name], female    DOB: 01/28/1946,  MRN: [id number]  Chief Complaint  Patient presents with   Toe Pain    Pt stated that she has some discomfort at the base of her 3rd toe     77 y.o. female presents with the above complaint.  Patient presents with complaint left second metatarsophalangeal joint pain.  Patient states is painful to touch has progressive gotten worse.  She wanted to discuss treatment options.  It hurts when she is ambulating especially going barefooted.  She has not tried anything for it.   Review of Systems: Negative except as noted in the HPI. Denies N/V/F/Ch.  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/2016   Chronic venous insufficiency 2016   severe reflux with painful varicosities sees [facility]   Clotting disorder ([place])    due to vein ablation    Cognitive dysfunction    Complication of anesthesia    difficulty coming out due to sleep apnea per pt   Difficult intubation    PATIENT DENIES    Diverticulosis    per ct scans 09-2016 and 01-2017   DVT (deep venous thrombosis) ([facility]) 2011   small, developed after venous ablation   DVT (deep venous thrombosis) ([place])    Family history of adverse reaction to anesthesia    O2 levels drop upon waking    Hearing loss sensory, bilateral 2013   mod-severe high freq sensorineural ([facility])   Hiatal hernia     09-2016 ct scan HP at cancer center    History of kidney stones 1980s   History of radiation therapy 07/19/11-08/25/2011   RIGHT AXILLARY REGION/METASTATIC   Hypertension    Melanoma ([place]) 06/29/2010   MALIGNANT MELANOMA R SHOULDER/SUPRASCAPULAR BACK s/p interferon chemo and XRT   Neuromuscular disorder ([facility])    muscle spasms   OSA (obstructive  sleep apnea)    on CPAP   Pneumonia    2001   RLS (restless legs syndrome)    Seasonal allergies    Sleep apnea    wears cpap   Tinnitus    Unspecified vitamin D deficiency 03/18/2013   Varicose veins of left lower extremity     Current Outpatient Medications:    acetaminophen (TYLENOL) 650 MG CR tablet, Take 2 tablets (1,300 mg total) by mouth 2 (two) times daily as needed for pain., Disp: , Rfl:    albuterol (VENTOLIN HFA) 108 (90 Base) MCG/ACT inhaler, INHALE 1-2 PUFFS BY MOUTH EVERY 6 HOURS AS NEEDED FOR WHEEZE OR SHORTNESS OF BREATH, Disp: 8.5 each, Rfl: 0   aspirin EC 81 MG tablet, Take 81 mg by mouth at bedtime. Melanoma prevention, Disp: , Rfl:    furosemide (LASIX) 40 MG tablet, Take 1 tablet (40 mg total) by mouth daily. Take second dose if needed for leg swelling/weight gain, Disp: 100 tablet, Rfl: 4   guaiFENesin-codeine (ROBITUSSIN AC) 100-10 MG/5ML syrup, Take 5 mLs by mouth 2 (two) times daily as needed for cough (sedation precautions)., Disp: 100 mL, Rfl: 0   loratadine (CLARITIN) 10 MG tablet, Take 10 mg by mouth daily., Disp: , Rfl:    losartan (COZAAR) 50 MG tablet, Take  1 tablet (50 mg total) by mouth daily., Disp: 90 tablet, Rfl: 4   montelukast (SINGULAIR) 10 MG tablet, Take 1 tablet (10 mg total) by mouth daily., Disp: 90 tablet, Rfl: 4   Multiple Vitamin (MULTIVITAMIN WITH MINERALS) TABS tablet, Take 1 tablet by mouth daily., Disp: , Rfl:    phenol (CHLORASEPTIC GARGLE) 1.4 % LIQD, Use as directed 1 spray in the mouth or throat as needed for throat irritation / pain., Disp: 118 mL, Rfl: 0   Polyvinyl Alcohol-Povidone (TEARS PLUS OP), Place 1 drop into both eyes daily as needed (dry eyes/ redness/ burning). , Disp: , Rfl:    potassium chloride SA (KLOR-CON M20) 20 MEQ tablet, Take 1 tablet (20 mEq total) by mouth 2 (two) times daily., Disp: 180 tablet, Rfl: 4   pregabalin (LYRICA) 25 MG capsule, Take 1 capsule (25 mg total) by mouth at bedtime for 7 days, THEN 1  capsule (25 mg total) 2 (two) times daily., Disp: 60 capsule, Rfl: 1   PRESCRIPTION MEDICATION, CPAP, Disp: , Rfl:    saccharomyces boulardii (FLORASTOR) 250 MG capsule, Take 1 capsule (250 mg total) by mouth 2 (two) times daily., Disp: , Rfl:    triamcinolone (NASACORT) 55 MCG/ACT AERO nasal inhaler, Place 2 sprays into the nose daily. In each nostril, Disp: , Rfl:    Vitamin D, Ergocalciferol, (DRISDOL) 1.25 MG (50000 UNIT) CAPS capsule, TAKE 1 CAPSULE BY MOUTH ONCE A WEEK, Disp: 12 capsule, Rfl: 4  Social History   Tobacco Use  Smoking Status Former   Types: Cigarettes   Quit date: 11/05/1965   Years since quitting: 30.1   Passive exposure: Never  Smokeless Tobacco Never  Tobacco Comments   socially as a teen    Allergies  Allergen Reactions   Sulfa Antibiotics Itching, Swelling and Shortness Of Breath    Facial swelling   Voltaren [Diclofenac Sodium] Shortness Of Breath   Latex Other (See Comments)    Blisters ONLY HAD REACTION TO TAPE  (??? ONLY ADHESIVE ALLERGY)   Tape Other (See Comments)    Caused blisters - must use paper tape - same reaction from NeoPrene   Doxycycline Other (See Comments)    Diaphoresis and dizziness   Gabapentin Other (See Comments)    confusion, sleep a lot, and swelling in her legs   Other     Neoprene   Objective:  There were no vitals filed for this visit. There is no height or weight on file to calculate BMI. Constitutional Well developed. Well nourished.  Vascular Dorsalis pedis pulses palpable bilaterally. Posterior tibial pulses palpable bilaterally. Capillary refill normal to all digits.  No cyanosis or clubbing noted. Pedal hair growth normal.  Neurologic Normal speech. Oriented to person, place, and time. Epicritic sensation to light touch grossly present bilaterally.  Dermatologic Nails well groomed and normal in appearance. No open wounds. No skin lesions.  Orthopedic: Pain on palpation left second metatarsophalangeal joint  pain with range of motion of the joint no deep intra-articular pain noted.  No extensor or flexor tendinitis noted no plantar plate rupture noted   Radiographs: None Assessment:   1. Capsulitis of metatarsophalangeal (MTP) joint of left foot    Plan:  Patient was evaluated and treated and all questions answered.  Left second metatarsophalangeal joint capsulitis -All questions and concerns were discussed with the patient in extensive detail -Given the pain that she is having she will benefit from a steroid injection help decrease inflammatory component associated pain.  Patient agrees  with plan like to proceed with steroid injection. -A steroid injection was performed at left second metatarsophalangeal joint using 1% plain Lidocaine and 10 mg of Kenalog. This was well tolerated.   No follow-ups on file.

## 2022-12-12 ENCOUNTER — Ambulatory Visit (HOSPITAL_COMMUNITY)
Admission: RE | Admit: 2022-12-12 | Discharge: 2022-12-12 | Disposition: A | Discharge status: Home / Self Care | Payer: Medicare Other | Source: Ambulatory Visit | Attending: Vascular Surgery | Admitting: Vascular Surgery

## 2022-12-12 ENCOUNTER — Encounter (HOSPITAL_BASED_OUTPATIENT_CLINIC_OR_DEPARTMENT_OTHER): Payer: Medicare Other | Attending: General Surgery | Admitting: General Surgery

## 2022-12-12 ENCOUNTER — Ambulatory Visit (INDEPENDENT_AMBULATORY_CARE_PROVIDER_SITE_OTHER): Payer: Medicare Other | Admitting: Physician Assistant

## 2022-12-12 VITALS — BP 154/85 | HR 68 | Temp 98.0°F | Ht 61.0 in | Wt 199.0 lb

## 2022-12-12 DIAGNOSIS — G4733 Obstructive sleep apnea (adult) (pediatric): Secondary | ICD-10-CM | POA: Insufficient documentation

## 2022-12-12 DIAGNOSIS — Q969 Turner's syndrome, unspecified: Secondary | ICD-10-CM | POA: Insufficient documentation

## 2022-12-12 DIAGNOSIS — I871 Compression of vein: Secondary | ICD-10-CM

## 2022-12-12 DIAGNOSIS — W5503XA Scratched by cat, initial encounter: Secondary | ICD-10-CM | POA: Diagnosis not present

## 2022-12-12 DIAGNOSIS — R600 Localized edema: Secondary | ICD-10-CM

## 2022-12-12 DIAGNOSIS — L97929 Non-pressure chronic ulcer of unspecified part of left lower leg with unspecified severity: Secondary | ICD-10-CM | POA: Diagnosis not present

## 2022-12-12 DIAGNOSIS — I83892 Varicose veins of left lower extremities with other complications: Secondary | ICD-10-CM | POA: Diagnosis not present

## 2022-12-12 DIAGNOSIS — I83029 Varicose veins of left lower extremity with ulcer of unspecified site: Secondary | ICD-10-CM | POA: Diagnosis not present

## 2022-12-12 DIAGNOSIS — I10 Essential (primary) hypertension: Secondary | ICD-10-CM | POA: Insufficient documentation

## 2022-12-12 DIAGNOSIS — L97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d: Secondary | ICD-10-CM | POA: Insufficient documentation

## 2022-12-12 DIAGNOSIS — Z86718 Personal history of other venous thrombosis and embolism: Secondary | ICD-10-CM | POA: Insufficient documentation

## 2022-12-12 DIAGNOSIS — L97818 Non-pressure chronic ulcer of other part of right lower leg with other specified severity: Secondary | ICD-10-CM | POA: Insufficient documentation

## 2022-12-12 DIAGNOSIS — G629 Polyneuropathy, unspecified: Secondary | ICD-10-CM | POA: Insufficient documentation

## 2022-12-12 DIAGNOSIS — I872 Venous insufficiency (chronic) (peripheral): Secondary | ICD-10-CM

## 2022-12-12 NOTE — Progress Notes (Signed)
Office Note     CC:  follow up Requesting Provider:  [name], MD  HPI: [name] is a 77 y.o. (Mar 05, 1946) female who presents for evaluation of IVC and left iliac venous stents.  She underwent stenting of the left common iliac vein consistent with May Thurner lesion by Dr. Donzetta Matters on 08/03/2019.  She previously had left greater saphenous vein ablation however has on and off venous ulcerations of her left leg.  She is active with the wound clinic and currently has Unna boots on both lower legs.  The right leg wound started recently from a scratch from one of her 25 cats.  She is on a daily aspirin.  She denies tobacco use.   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/2016   Chronic venous insufficiency 2016   severe reflux with painful varicosities sees [facility]   Clotting disorder ([place])    due to vein ablation    Cognitive dysfunction    Complication of anesthesia    difficulty coming out due to sleep apnea per pt   Difficult intubation    PATIENT DENIES    Diverticulosis    per ct scans 09-2016 and 01-2017   DVT (deep venous thrombosis) ([place]) 2011   small, developed after venous ablation   DVT (deep venous thrombosis) ([place])    Family history of adverse reaction to anesthesia    O2 levels drop upon waking    Hearing loss sensory, bilateral 2013   mod-severe high freq sensorineural ([facility])   Hiatal hernia     09-2016 ct scan HP at cancer center    History of kidney stones 1980s   History of radiation therapy 07/19/11-08/25/2011   RIGHT AXILLARY REGION/METASTATIC   Hypertension    Melanoma ([place]) 06/29/2010   MALIGNANT MELANOMA R SHOULDER/SUPRASCAPULAR BACK s/p interferon chemo and XRT   Neuromuscular disorder ([facility])    muscle spasms    OSA (obstructive sleep apnea)    on CPAP   Pneumonia    2001   RLS (restless legs syndrome)    Seasonal allergies    Sleep apnea    wears cpap   Tinnitus    Unspecified vitamin D deficiency 03/18/2013   Varicose veins of left lower extremity     Past Surgical History:  Procedure Laterality Date   ABI  2016   WNL   ANTERIOR CERVICAL DECOMP/DISCECTOMY FUSION N/A 08/22/2015   ANTERIOR CERVICAL DECOMPRESSION FUSION C3/4 - interbody graft and anterior plate; Kristeen Miss, MD   ANTERIOR CERVICAL DECOMP/DISCECTOMY FUSION N/A 07/02/2016   Procedure: Cervical four- five Cervical five- six Cervical six- seven Anterior cervical decompression/diskectomy/fusion;  Surgeon: Kristeen Miss, MD;  Location: [facility];  Service: Neurosurgery;  Laterality: N/A;  C4-5 C5-6 C6-7 Anterior cervical decompression/diskectomy/fusion   BREAST BIOPSY Left 2013   BENIGN   BREAST SURGERY Right 1990   BX    CARDIOVASCULAR STRESS TEST  2010   normal stress test, EF 66%   CATARACT EXTRACTION W/ INTRAOCULAR LENS IMPLANT Left 2019   Dr. Kathrin Penner   COLONOSCOPY  03/2005   benign polyp, int hem, rpt 5 yrs ([place], [place])   COLONOSCOPY WITH PROPOFOL N/A 01/06/2018  diverticulosis, decreased sphincter tone, no f/u needed ([place], Kirke Corin, MD)   [place] / EXCISION  2012   RIGHT   DEXA  06/2016   WNL   DILATION AND CURETTAGE OF UTERUS  2010   ENDOVENOUS ABLATION SAPHENOUS VEIN W/ LASER Left 2010   [facility]   ENDOVENOUS ABLATION SAPHENOUS VEIN W/ LASER Left 2016   accessory branch [facility] Kellie Simmering)   ESOPHAGOGASTRODUODENOSCOPY  2006   mod chronic carditis, no H pylori ([place], [place])   INTRAVASCULAR ULTRASOUND/IVUS N/A 08/03/2019   Waynetta Sandy, MD   KNEE SURGERY  1985   left   LOWER EXTREMITY VENOGRAPHY Left 08/03/2019   Waynetta Sandy, MD   LUMBAR LAMINECTOMY  2001   L4/5   MELANOMA EXCISION  2011   Right shoulder, with sentinel lymph node biopsy    PERIPHERAL VASCULAR INTERVENTION Left 08/03/2019   Stent of left common iliac vein with 14 x 60 mm Vici Donzetta Matters, [place] Dom, MD)   [facility] REMOVAL  12/05/2011   Procedure: REMOVAL PORT-A-CATH;  Surgeon: Rolm Bookbinder, MD;  Location: [facility];  Service: General;  Laterality: N/A;  left port removal   PORTACATH PLACEMENT     left subclavian   UPPER GASTROINTESTINAL ENDOSCOPY      Social History   Socioeconomic History   Marital status: Divorced    Spouse name: Not on file   Number of children: 0   Years of education: Not on file   Highest education level: Some college, no degree  Occupational History   Occupation: retired    Fish farm manager: RETIRED    Comment: Government social research officer   Tobacco Use   Smoking status: Former    Types: Cigarettes    Quit date: 11/05/1965    Years since quitting: 71.1    Passive exposure: Never   Smokeless tobacco: Never   Tobacco comments:    socially as a teen  Vaping Use   Vaping Use: Former  Substance and Sexual Activity   Alcohol use: No    Alcohol/week: 0.0 standard drinks of alcohol   Drug use: No   Sexual activity: Never  Other Topics Concern   Not on file  Social History Narrative   L handed   01/04/22 Lives alone in RV.  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   Edu: some college   Diet: poor - good water, rare fruits/vegetables   Social Determinants of Health   Financial Resource Strain: Low Risk  (10/19/2022)   Overall Financial Resource Strain (CARDIA)    Difficulty of Paying Living Expenses: Not very hard  Food Insecurity: No Food Insecurity (10/19/2022)   Hunger Vital Sign    Worried About Running Out of Food in the Last Year: Never true    Ran Out of Food in the Last Year: Never true  Transportation Needs: No Transportation Needs (10/19/2022)   PRAPARE - Hydrologist (Medical): No    Lack of Transportation  (Non-Medical): No  Physical Activity: Sufficiently Active (10/19/2022)   Exercise Vital Sign    Days of Exercise per Week: 7 days    Minutes of Exercise per Session: 30 min  Stress: No Stress Concern Present (10/19/2022)   [place]    Feeling of Stress : Not at all  Social Connections: Moderately Isolated (10/19/2022)   Social Connection and Isolation  Panel [NHANES]    Frequency of Communication with Friends and Family: More than three times a week    Frequency of Social Gatherings with Friends and Family: More than three times a week    Attends Religious Services: Never    Marine scientist or Organizations: Yes    Attends Music therapist: More than 4 times per year    Marital Status: Divorced  Intimate Partner Violence: Not At Risk (10/19/2022)   Humiliation, Afraid, Rape, and Kick questionnaire    Fear of Current or Ex-Partner: No    Emotionally Abused: No    Physically Abused: No    Sexually Abused: No    Family History  Problem Relation Age of Onset   Cancer Mother 18       uterine   CAD Father 34       MI   Hypertension Father 80   Alzheimer's disease Father 36   Heart attack Father    Diabetes Sister    Cancer Paternal Grandmother        melanoma, possibly   Cancer Cousin        x2, breast   Colon cancer Neg Hx    Colon polyps Neg Hx    Rectal cancer Neg Hx    Stomach cancer Neg Hx     Current Outpatient Medications  Medication Sig Dispense Refill   acetaminophen (TYLENOL) 650 MG CR tablet Take 2 tablets (1,300 mg total) by mouth 2 (two) times daily as needed for pain.     albuterol (VENTOLIN HFA) 108 (90 Base) MCG/ACT inhaler INHALE 1-2 PUFFS BY MOUTH EVERY 6 HOURS AS NEEDED FOR WHEEZE OR SHORTNESS OF BREATH 8.5 each 0   aspirin EC 81 MG tablet Take 81 mg by mouth at bedtime. Melanoma prevention     furosemide (LASIX) 40 MG tablet Take 1 tablet (40 mg total) by mouth daily.  Take second dose if needed for leg swelling/weight gain 100 tablet 4   guaiFENesin-codeine (ROBITUSSIN AC) 100-10 MG/5ML syrup Take 5 mLs by mouth 2 (two) times daily as needed for cough (sedation precautions). 100 mL 0   loratadine (CLARITIN) 10 MG tablet Take 10 mg by mouth daily.     losartan (COZAAR) 50 MG tablet Take 1 tablet (50 mg total) by mouth daily. 90 tablet 4   montelukast (SINGULAIR) 10 MG tablet Take 1 tablet (10 mg total) by mouth daily. 90 tablet 4   Multiple Vitamin (MULTIVITAMIN WITH MINERALS) TABS tablet Take 1 tablet by mouth daily.     phenol (CHLORASEPTIC GARGLE) 1.4 % LIQD Use as directed 1 spray in the mouth or throat as needed for throat irritation / pain. 118 mL 0   Polyvinyl Alcohol-Povidone (TEARS PLUS OP) Place 1 drop into both eyes daily as needed (dry eyes/ redness/ burning).      potassium chloride SA (KLOR-CON M20) 20 MEQ tablet Take 1 tablet (20 mEq total) by mouth 2 (two) times daily. 180 tablet 4   pregabalin (LYRICA) 25 MG capsule Take 1 capsule (25 mg total) by mouth at bedtime for 7 days, THEN 1 capsule (25 mg total) 2 (two) times daily. 60 capsule 1   PRESCRIPTION MEDICATION CPAP     saccharomyces boulardii (FLORASTOR) 250 MG capsule Take 1 capsule (250 mg total) by mouth 2 (two) times daily.     triamcinolone (NASACORT) 55 MCG/ACT AERO nasal inhaler Place 2 sprays into the nose daily. In each nostril     Vitamin D, Ergocalciferol, (  DRISDOL) 1.25 MG (50000 UNIT) CAPS capsule TAKE 1 CAPSULE BY MOUTH ONCE A WEEK 12 capsule 4   No current facility-administered medications for this visit.    Allergies  Allergen Reactions   Sulfa Antibiotics Itching, Swelling and Shortness Of Breath    Facial swelling   Voltaren [Diclofenac Sodium] Shortness Of Breath   Latex Other (See Comments)    Blisters ONLY HAD REACTION TO TAPE  (??? ONLY ADHESIVE ALLERGY)   Tape Other (See Comments)    Caused blisters - must use paper tape - same reaction from NeoPrene    Doxycycline Other (See Comments)    Diaphoresis and dizziness   Gabapentin Other (See Comments)    confusion, sleep a lot, and swelling in her legs   Other     Neoprene     REVIEW OF SYSTEMS:   '[X]'$  denotes positive finding, '[ ]'$  denotes negative finding Cardiac  Comments:  Chest pain or chest pressure:    Shortness of breath upon exertion:    Short of breath when lying flat:    Irregular heart rhythm:        Vascular    Pain in calf, thigh, or hip brought on by ambulation:    Pain in feet at night that wakes you up from your sleep:     Blood clot in your veins:    Leg swelling:         Pulmonary    Oxygen at home:    Productive cough:     Wheezing:         Neurologic    Sudden weakness in arms or legs:     Sudden numbness in arms or legs:     Sudden onset of difficulty speaking or slurred speech:    Temporary loss of vision in one eye:     Problems with dizziness:         Gastrointestinal    Blood in stool:     Vomited blood:         Genitourinary    Burning when urinating:     Blood in urine:        Psychiatric    Major depression:         Hematologic    Bleeding problems:    Problems with blood clotting too easily:        Skin    Rashes or ulcers:        Constitutional    Fever or chills:      PHYSICAL EXAMINATION:  Vitals:   12/12/22 1033  BP: (!) 154/85  Pulse: 68  Temp: 98 F (36.7 C)  TempSrc: Temporal  SpO2: 99%  Weight: 199 lb (90.3 kg)  Height: '5\' 1"'$  (1.549 m)    General:  WDWN in NAD; vital signs documented above Gait: Not observed HENT: WNL, normocephalic Pulmonary: normal non-labored breathing , without Rales, rhonchi,  wheezing Cardiac: regular HR Abdomen: soft, NT, no masses Skin: without rashes Vascular Exam/Pulses:  Right Left  Radial 2+ (normal) 2+ (normal)  DP 2+ (normal) 2+ (normal)   Extremities: without ischemic changes, without Gangrene , without cellulitis; without open wounds;  Musculoskeletal: no muscle  wasting or atrophy  Neurologic: A&O X 3;  No focal weakness or paresthesias are detected Psychiatric:  The pt has Normal affect.   Non-Invasive Vascular Imaging:    Patent left iliac venous stent   ASSESSMENT/PLAN:: 77 y.o. female here for follow up for surveillance of left iliac venous stent  -Left  iliac venous stent widely patent based on venous duplex -Continue current wound care to bilateral lower extremities -Continue aspirin daily -Recheck IVC/iliac venous duplex in 1 year   Dagoberto Ligas, PA-C [facility] [phone]  Clinic MD:   Donzetta Matters

## 2022-12-13 NOTE — Progress Notes (Addendum)
[name], [name] ([id number]) [id number] Page 1 of 6 Visit Report for 12/12/2022 Arrival Information Details Patient Name: Date of Service: [name], [name] 12/12/2022 1:30 PM Medical Record Number: [id number] Patient Account Number: [id number] Date of Birth/Sex: Treating RN: 05-04-1946 (77 y.o. [name], [name] Primary Care Trystyn Sitts: [name] Other Clinician: Referring Shota Kohrs: Treating Raynah Gomes/Extender: [name] Session in Treatment: 15 Visit Information History Since Last Visit Added or deleted any medications: No Patient Arrived: Ambulatory Any new allergies or adverse reactions: No Arrival Time: 16:44 Had a fall or experienced change in No Accompanied By: Self activities of daily living that may affect Transfer Assistance: None risk of falls: Patient Identification Verified: Yes Signs or symptoms of abuse/neglect since last visito No Secondary Verification Process Completed: Yes Hospitalized since last visit: No Patient Requires Transmission-Based Precautions: No Implantable device outside of the clinic excluding No Patient Has Alerts: No cellular tissue based products placed in the center since last visit: Has Compression in Place as Prescribed: Yes Pain Present Now: Yes Electronic Signature(s) Signed: 12/12/2022 4:44:24 PM By: Blanche East RN Entered By: Blanche East on 12/12/2022 16:44:24 -------------------------------------------------------------------------------- Compression Therapy Details Patient Name: Date of Service: [name], [name] 12/12/2022 1:30 PM Medical Record Number: [id number] Patient Account Number: [id number] Date of Birth/Sex: Treating RN: 04-03-1946 (77 y.o. [name] Primary Care Merikay Lesniewski: [name] Other Clinician: Referring Kristell Wooding: Treating Grisell Bissette/Extender: [name] Session in Treatment: 19 Compression Therapy Performed for Wound Assessment: Wound  #2 Left,Posterior Lower Leg Performed By: Clinician Blanche East, RN Compression Type: Rolena Infante Electronic Signature(s) Signed: 12/12/2022 4:45:45 PM By: Blanche East RN Entered By: Blanche East on 12/12/2022 16:45:44 -------------------------------------------------------------------------------- Compression Therapy Details Patient Name: Date of Service: [name], [name] 12/12/2022 1:30 PM Medical Record Number: [id number] Patient Account Number: [id number] Date of Birth/Sex: Treating RN: 1945-12-10 (77 y.o. [address], [place], [place] [name] ([id number]) [id number] Page 2 of 6 Primary Care Jacen Carlini: [name] Other Clinician: Referring Ciarrah Rae: Treating Suleika Donavan/Extender: [name] Session in Treatment: 19 Compression Therapy Performed for Wound Assessment: Wound #3 Right,Lateral Ankle Performed By: Clinician Blanche East, RN Compression Type: Rolena Infante Electronic Signature(s) Signed: 12/12/2022 4:46:11 PM By: Blanche East RN Entered By: Blanche East on 12/12/2022 16:46:11 -------------------------------------------------------------------------------- Encounter Discharge Information Details Patient Name: Date of Service: [name] 12/12/2022 1:30 PM Medical Record Number: [id number] Patient Account Number: [id number] Date of Birth/Sex: Treating RN: Mar 15, 1946 (77 y.o. [name] Primary Care Jaylyn Booher: [name] Other Clinician: Referring Brendaly Townsel: Treating Corben Auzenne/Extender: [name] Session in Treatment: 19 Encounter Discharge Information Items Discharge Condition: Stable Ambulatory Status: Ambulatory Discharge Destination: Home Transportation: Private Auto Accompanied By: self Schedule Follow-up Appointment: Yes Clinical Summary of Care: Electronic Signature(s) Signed: 12/12/2022 4:47:08 PM By: Blanche East RN Entered By: Blanche East on 12/12/2022  16:47:08 -------------------------------------------------------------------------------- Patient/Caregiver Education Details Patient Name: Date of Service: [name] 2/7/2024andnbsp1:30 PM Medical Record Number: [id number] Patient Account Number: [id number] Date of Birth/Gender: Treating RN: [birth year] (77 y.o. [name] Primary Care Physician: [name] Other Clinician: Referring Physician: Treating Physician/Extender: [name] Session in Treatment: 19 Education Assessment Education Provided To: Patient Education Topics Provided Wound Debridement: Methods: Explain/Verbal Responses: Reinforcements needed, State content correctly Wound/Skin Impairment: Methods: Explain/Verbal Responses: Reinforcements needed, State content correctly [name], [name] ([id number]) [id number] Page 3 of 6 Electronic Signature(s) Signed: 12/12/2022 5:07:38 PM By: Blanche East RN Entered By: Blanche East on 12/12/2022 16:46:54 -------------------------------------------------------------------------------- Wound Assessment Details Patient Name: Date of Service: [name],  [name] 12/12/2022 1:30 PM Medical Record Number: [id number] Patient Account Number: [id number] Date of Birth/Sex: Treating RN: [birth year] (77 y.o. [name], [name] Primary Care Krisinda Giovanni: [name] Other Clinician: Referring Kathren Scearce: Treating Sanyah Molnar/Extender: Pixie Casino Weeks in Treatment: 19 Wound Status Wound Number: 2 Primary Venous Leg Ulcer Etiology: Wound Location: Left, Posterior Lower Leg Wound Open Wounding Event: Blister Status: Date Acquired: 07/06/2022 Comorbid Cataracts, Asthma, Hypertension, Neuropathy, Received Weeks Of Treatment: 19 History: Chemotherapy, Received Radiation Clustered Wound: No Wound Measurements Length: (cm) 5.2 Width: (cm) 2.5 Depth: (cm) 0.1 Area: (cm) 10.21 Volume: (cm) 1.021 %  Reduction in Area: -48.6% % Reduction in Volume: -48.6% Epithelialization: Small (1-33%) Tunneling: No Undermining: No Wound Description Classification: Full Thickness Without Exposed Support Structures Wound Margin: Flat and Intact Exudate Amount: Medium Exudate Type: Serosanguineous Exudate Color: red, brown Foul Odor After Cleansing: No Slough/Fibrino Yes Wound Bed Granulation Amount: Small (1-33%) Exposed Structure Granulation Quality: Red Fascia Exposed: No Necrotic Amount: Large (67-100%) Fat Layer (Subcutaneous Tissue) Exposed: Yes Necrotic Quality: Eschar, Adherent Slough Tendon Exposed: No Muscle Exposed: No Joint Exposed: No Bone Exposed: No Periwound Skin Texture Texture Color No Abnormalities Noted: Yes No Abnormalities Noted: Yes Moisture Temperature / Pain No Abnormalities Noted: Yes Temperature: No Abnormality Tenderness on Palpation: Yes Treatment Notes Wound #2 (Lower Leg) Wound Laterality: Left, Posterior Cleanser Soap and Water Discharge Instruction: May shower and wash wound with dial antibacterial soap and water prior to dressing change. Peri-Wound Care Sween Lotion (Moisturizing lotion) Discharge Instruction: Apply moisturizing lotion as directed [name], CASSERLY ([id number]) [id number] Page 4 of 6 Topical Keystone Discharge Instruction: Apply as directed to wound Primary Dressing Santyl Ointment Discharge Instruction: Apply nickel thick amount to wound bed as instructed Secondary Dressing T Non-adherent Dressing, 2x3 in elfa Discharge Instruction: Apply over primary dressing as directed. Zetuvit Plus 4x8 in Discharge Instruction: Apply over primary dressing as directed. Secured With [employer] Surgical T 2x10 (in/yd) ape Discharge Instruction: Secure with tape as directed. Compression Wrap Unnaboot w/Calamine, 4x10 (in/yd) Discharge Instruction: Apply Unnaboot as directed. Compression  Stockings Add-Ons Electronic Signature(s) Signed: 12/12/2022 4:45:22 PM By: Blanche East RN Entered By: Blanche East on 12/12/2022 16:45:21 -------------------------------------------------------------------------------- Wound Assessment Details Patient Name: Date of Service: [name], [name] 12/12/2022 1:30 PM Medical Record Number: [id number] Patient Account Number: [id number] Date of Birth/Sex: Treating RN: 05-07-1946 (77 y.o. [name], [name] Primary Care Satonya Lux: [name] Other Clinician: Referring Arnetta Odeh: Treating Kyrstan Gotwalt/Extender: Pixie Casino Weeks in Treatment: 19 Wound Status Wound Number: 3 Primary Venous Leg Ulcer Etiology: Wound Location: Right, Lateral Ankle Wound Open Wounding Event: Contusion/Bruise Status: Date Acquired: 10/19/2022 Comorbid Cataracts, Asthma, Hypertension, Neuropathy, Received Weeks Of Treatment: 7 History: Chemotherapy, Received Radiation Clustered Wound: No Wound Measurements Length: (cm) 1 Width: (cm) 0.9 Depth: (cm) 0.1 Area: (cm) 0.707 Volume: (cm) 0.071 % Reduction in Area: 73.5% % Reduction in Volume: 73.4% Epithelialization: Small (1-33%) Tunneling: No Undermining: No Wound Description Classification: Full Thickness Without Exposed Support Structures Wound Margin: Flat and Intact Exudate Amount: Medium Exudate Type: Serosanguineous Exudate Color: red, brown Foul Odor After Cleansing: No Slough/Fibrino Yes Wound Bed Granulation Amount: Medium (34-66%) Exposed Structure Necrotic Amount: Medium (34-66%) Fascia Exposed: No Necrotic Quality: Adherent Slough Fat Layer (Subcutaneous Tissue) ExposedMIYOSHI, LIGAS ([id number]) [id number] Page 5 of 6 Tendon Exposed: No Muscle Exposed: No Joint Exposed: No Bone Exposed: No Periwound Skin Texture Texture Color No Abnormalities Noted: Yes No Abnormalities Noted: No Erythema: No Moisture No Abnormalities Noted:  Yes Temperature / Pain Temperature: No Abnormality Tenderness on Palpation: Yes Treatment Notes Wound #3 (Ankle) Wound Laterality: Right, Lateral Cleanser Soap and Water Discharge Instruction: May shower and wash wound with dial antibacterial soap and water prior to dressing change. Peri-Wound Care Sween Lotion (Moisturizing lotion) Discharge Instruction: Apply moisturizing lotion as directed Topical Keystone Discharge Instruction: Apply as directed to wound Primary Dressing Santyl Ointment Discharge Instruction: Apply nickel thick amount to wound bed as instructed Secondary Dressing T Non-adherent Dressing, 2x3 in elfa Discharge Instruction: Apply over primary dressing as directed. Woven Gauze Sponge, Non-Sterile 4x4 in Discharge Instruction: Apply over primary dressing as directed. Secured With [employer] Surgical T 2x10 (in/yd) ape Discharge Instruction: Secure with tape as directed. Compression Wrap Unnaboot w/Calamine, 4x10 (in/yd) Discharge Instruction: Apply Unnaboot as directed. Compression Stockings Add-Ons Electronic Signature(s) Signed: 12/12/2022 4:45:29 PM By: Blanche East RN Entered By: Blanche East on 12/12/2022 16:45:29 -------------------------------------------------------------------------------- Vitals Details Patient Name: Date of Service: [name]. 12/12/2022 1:30 PM Medical Record Number: [id number] Patient Account Number: [id number] Date of Birth/Sex: Treating RN: 1946-08-15 (77 y.o. [name] Primary Care Kesi Perrow: [name] Other Clinician: Referring Ashauna Bertholf: Treating Schneur Crowson/Extender: [name] Session in Treatment: [address] NIMAH, [name] ([id number]) [id number] Page 6 of 6 Time Taken: 13:45 Reference Range: 80 - 120 mg / dl Height (in): 63 Weight (lbs): 200 Body Mass Index (BMI): 35.4 Electronic Signature(s) Signed: 12/12/2022 4:45:02 PM By: Blanche East RN Entered By: Blanche East on 12/12/2022 16:45:02

## 2022-12-13 NOTE — Progress Notes (Signed)
ABRYANNA, MUSOLINO ([id number]) [id number] Page 1 of 1 Visit Report for 12/12/2022 SuperBill Details Patient Name: Date of Service: [name], [name] 12/12/2022 Medical Record Number: [id number] Patient Account Number: [id number] Date of Birth/Sex: Treating RN: [birth year] (77 y.o. [name], [name] Primary Care Provider: [name] Other Clinician: Referring Provider: Treating Provider/Extender: [name] Session in Treatment: 19 Diagnosis Coding ICD-10 Codes Code Description [id number] Non-pressure chronic ulcer of other part of left lower leg with fat layer exposed L97.818 Non-pressure chronic ulcer of other part of right lower leg with other specified severity I83.893 Varicose veins of bilateral lower extremities with other complications T62.5 Venous insufficiency (chronic) (peripheral) G62.9 Polyneuropathy, unspecified R60.0 Localized edema L03.115 Cellulitis of right lower limb Facility Procedures CPT4 Description Modifier Quantity Code [id number] BILATERAL: Application of multi-layer venous compression system; leg (below knee), including ankle and 1 foot. ICD-10 Diagnosis Description L97.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d L97.818 Non-pressure chronic ulcer of other part of right lower leg with other specified severity Electronic Signature(s) Signed: 12/12/2022 4:47:25 PM By: Blanche East RN Signed: 12/12/2022 5:26:12 PM By: Fredirick Maudlin MD FACS Entered By: Blanche East on 12/12/2022 16:47:24

## 2022-12-19 ENCOUNTER — Encounter (HOSPITAL_BASED_OUTPATIENT_CLINIC_OR_DEPARTMENT_OTHER): Payer: Medicare Other | Admitting: General Surgery

## 2022-12-19 DIAGNOSIS — G4733 Obstructive sleep apnea (adult) (pediatric): Secondary | ICD-10-CM | POA: Diagnosis not present

## 2022-12-19 DIAGNOSIS — L97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d: Secondary | ICD-10-CM | POA: Diagnosis not present

## 2022-12-19 DIAGNOSIS — G629 Polyneuropathy, unspecified: Secondary | ICD-10-CM | POA: Diagnosis not present

## 2022-12-19 DIAGNOSIS — L97312 Non-pressure chronic ulcer of right ankle with fat layer exposed: Secondary | ICD-10-CM | POA: Diagnosis not present

## 2022-12-19 DIAGNOSIS — Q969 Turner's syndrome, unspecified: Secondary | ICD-10-CM | POA: Diagnosis not present

## 2022-12-19 DIAGNOSIS — L97222 Non-pressure chronic ulcer of left calf with fat layer exposed: Secondary | ICD-10-CM | POA: Diagnosis not present

## 2022-12-19 DIAGNOSIS — L97818 Non-pressure chronic ulcer of other part of right lower leg with other specified severity: Secondary | ICD-10-CM | POA: Diagnosis not present

## 2022-12-19 DIAGNOSIS — I10 Essential (primary) hypertension: Secondary | ICD-10-CM | POA: Diagnosis not present

## 2022-12-19 DIAGNOSIS — I872 Venous insufficiency (chronic) (peripheral): Secondary | ICD-10-CM | POA: Diagnosis not present

## 2022-12-20 ENCOUNTER — Ambulatory Visit: Admit: 2022-12-20 | Payer: Medicare Other | Admitting: Ophthalmology

## 2022-12-20 SURGERY — PHACOEMULSIFICATION, CATARACT, WITH IOL INSERTION
Anesthesia: Topical | Laterality: Right

## 2022-12-21 NOTE — Progress Notes (Signed)
[name] [id number] Page 1 of 9 Visit Report for 12/19/2022 Arrival Information Details Patient Name: Date of Service: Amber, [name] 12/19/2022 1:30 PM Medical Record Number: [id number] Patient Account Number: [id number] Date of Birth/Sex: Treating RN: 04-03-1946 (77 y.o. F) Primary Care [name]: [name] Other Clinician: Referring [name]: Treating [name]/Extender: [name] Session in Treatment: 20 Visit Information History Since Last Visit All ordered tests and consults were completed: No Patient Arrived: Ambulatory Added or deleted any medications: No Arrival Time: 13:25 Any new allergies or adverse reactions: No Accompanied By: self Had a fall or experienced change in No Transfer Assistance: None activities of daily living that may affect Patient Identification Verified: Yes risk of falls: Secondary Verification Process Completed: Yes Signs or symptoms of abuse/neglect since last visito No Patient Requires Transmission-Based Precautions: No Hospitalized since last visit: No Patient Has Alerts: No Implantable device outside of the clinic excluding No cellular tissue based products placed in the center since last visit: Pain Present Now: No Electronic Signature(s) Signed: 12/19/2022 3:10:42 PM By: [name] Entered By: [name] on 12/19/2022 13:26:41 -------------------------------------------------------------------------------- Compression Therapy Details Patient Name: Date of Service: Amber, [name] 12/19/2022 1:30 PM Medical Record Number: [id number] Patient Account Number: [id number] Date of Birth/Sex: Treating RN: Dec 11, 1945 (77 y.o. [name] Primary Care [name]: [name] Other Clinician: Referring [name]: Treating [name]/Extender: [name] Session in Treatment: 20 Compression Therapy Performed for Wound Assessment: Wound #2  Left,Posterior Lower Leg Performed By: Clinician Amber Catholic, RN Compression Type: Rolena Infante Post Procedure Diagnosis Same as Pre-procedure Electronic Signature(s) Signed: 12/19/2022 5:21:39 PM By: Amber Catholic RN Entered By: [name] on 12/19/2022 14:01:22 [name] [id number] Page 2 of 9 -------------------------------------------------------------------------------- Compression Therapy Details Patient Name: Date of Service: Amber, [name] 12/19/2022 1:30 PM Medical Record Number: [id number] Patient Account Number: [id number] Date of Birth/Sex: Treating RN: 1946/08/04 (77 y.o. [name] Primary Care Aithana Kushner: [name] Other Clinician: Referring Donne Baley: Treating Ladena [name]/Extender: [name] Weeks in Treatment: 20 Compression Therapy Performed for Wound Assessment: Wound #3 Right,Lateral Ankle Performed By: Clinician Amber Catholic, RN Compression Type: Rolena Infante Post Procedure Diagnosis Same as Pre-procedure Electronic Signature(s) Signed: 12/19/2022 5:21:39 PM By: Amber Catholic RN Entered By: [name] on 12/19/2022 14:01:22 -------------------------------------------------------------------------------- Encounter Discharge Information Details Patient Name: Date of Service: Amber, [name] 12/19/2022 1:30 PM Medical Record Number: [id number] Patient Account Number: [id number] Date of Birth/Sex: Treating RN: September 15, 1946 (77 y.o. [name] Primary Care [name]: [name] Other Clinician: Referring [name]: Treating [name]/Extender: [name] Session in Treatment: 20 Encounter Discharge Information Items Post Procedure Vitals Discharge Condition: Stable Temperature (F): 97.8 Ambulatory Status: Ambulatory Pulse (bpm): 71 Discharge Destination: Home Respiratory Rate (breaths/min): 20 Transportation: Private Auto Blood  Pressure (mmHg): 151/82 Accompanied By: self Schedule Follow-up Appointment: Yes Clinical Summary of Care: Patient Declined Electronic Signature(s) Signed: 12/19/2022 5:21:39 PM By: Amber Catholic RN Entered By: [name] on 12/19/2022 15:31:27 -------------------------------------------------------------------------------- Lower Extremity Assessment Details Patient Name: Date of Service: Amber, [name] 12/19/2022 1:30 PM Medical Record Number: [id number] Patient Account Number: [id number] Date of Birth/Sex: Treating RN: [birth year] (77 y.o. [name] Primary Care [name]: [name] Other Clinician: Referring [name]: Treating [name]/Extender: [name] Weeks in Treatment: 20 Edema Assessment Assessed: [Left: No] [Right: No] Edema: [Left: Ye] [Right: s] Calf [name] [id number] Page 3 of 9 Left: Right: Point of Measurement: From Medial Instep 38.3 cm 32.5 cm  Ankle Left: Right: Point of Measurement: From Medial Instep 23.3 cm 22.5 cm Vascular Assessment Pulses: Dorsalis Pedis Palpable: [Left:Yes] [Right:Yes] Electronic Signature(s) Signed: 12/19/2022 5:21:39 PM By: Amber Catholic RN Entered By: [name] on 12/19/2022 13:46:07 -------------------------------------------------------------------------------- Multi Wound Chart Details Patient Name: Date of Service: [name]. 12/19/2022 1:30 PM Medical Record Number: [id number] Patient Account Number: [id number] Date of Birth/Sex: Treating RN: [birth year] (77 y.o. F) Primary Care [name]: [name] Other Clinician: Referring [name]: Treating [name]/Extender: [name] Session in Treatment: 20 Vital Signs Height(in): 63 Pulse(bpm): 49 Weight(lbs): 200 Blood Pressure(mmHg): 151/82 Body Mass Index(BMI): 35.4 Temperature(F): 97.8 Respiratory Rate(breaths/min): 20 [2:Photos:]  [N/A:N/A] Left, Posterior Lower Leg Right, Lateral Ankle N/A Wound Location: Blister Contusion/Bruise N/A Wounding Event: Venous Leg Ulcer Venous Leg Ulcer N/A Primary Etiology: Cataracts, Asthma, Hypertension, Cataracts, Asthma, Hypertension, N/A Comorbid History: Neuropathy, Received Chemotherapy, Neuropathy, Received Chemotherapy, Received Radiation Received Radiation 07/06/2022 10/19/2022 N/A Date Acquired: 20 8 N/A Weeks of Treatment: Open Open N/A Wound Status: No No N/A Wound Recurrence: 5x2x0.1 0.1x0.1x0.1 N/A Measurements L x W x D (cm) 7.854 0.008 N/A A (cm) : rea 0.785 0.001 N/A Volume (cm) : -14.30% 99.70% N/A % Reduction in Area: -14.30% 99.60% N/A % Reduction in Volume: Full Thickness Without Exposed Full Thickness Without Exposed N/A Classification: Support Structures Support Structures Medium Medium N/A Exudate Amount: Serosanguineous Serosanguineous N/A Exudate Type: red, brown red, brown N/A Exudate Color: Flat and Intact Flat and Intact N/A Wound Margin: Small (1-33%) Medium (34-66%) N/A Granulation Amount: Red N/A N/A Granulation QualitySHAYONA, LAVOIE ([id number]) [id number] Page 4 of 9 Large (67-100%) Medium (34-66%) N/A Necrotic Amount: Eschar, Adherent [facility] N/A Necrotic Tissue: Fat Layer (Subcutaneous Tissue): Yes Fat Layer (Subcutaneous Tissue): Yes N/A Exposed Structures: Fascia: No Fascia: No Tendon: No Tendon: No Muscle: No Muscle: No Joint: No Joint: No Bone: No Bone: No Small (1-33%) Small (1-33%) N/A Epithelialization: Debridement - Selective/Open Wound Debridement - Selective/Open Wound N/A Debridement: Pre-procedure Verification/Time Out 13:50 13:50 N/A Taken: Lidocaine 5% topical ointment Lidocaine 5% topical ointment N/A Pain Control: [employer] N/A Tissue Debrided: Non-Viable Tissue Non-Viable Tissue N/A Level: 10 0.01 N/A Debridement A (sq cm): rea Curette  Curette N/A Instrument: Minimum Minimum N/A Bleeding: Pressure Pressure N/A Hemostasis A chieved: 0 0 N/A Procedural Pain: 0 0 N/A Post Procedural Pain: Procedure was tolerated well Procedure was tolerated well N/A Debridement Treatment Response: 5x2x0.1 0.1x0.1x0.1 N/A Post Debridement Measurements L x W x D (cm) 0.785 0.001 N/A Post Debridement Volume: (cm) Excoriation: No Excoriation: Yes N/A Periwound Skin Texture: Maceration: No Maceration: Yes N/A Periwound Skin Moisture: No Abnormalities Noted Erythema: No N/A Periwound Skin Color: No Abnormality No Abnormality N/A Temperature: Yes Yes N/A Tenderness on Palpation: Compression Therapy Compression Therapy N/A Procedures Performed: Debridement Debridement Treatment Notes Electronic Signature(s) Signed: 12/19/2022 2:06:40 PM By: Fredirick Maudlin MD FACS Entered By: Fredirick Maudlin on 12/19/2022 14:06:40 -------------------------------------------------------------------------------- Multi-Disciplinary Care Plan Details Patient Name: Date of Service: [name]. 12/19/2022 1:30 PM Medical Record Number: [id number] Patient Account Number: [id number] Date of Birth/Sex: Treating RN: 1946/03/07 (77 y.o. [name] Primary Care Matilynn Dacey: [name] Other Clinician: Referring Tayte Mcwherter: Treating Denece Shearer/Extender: [name] Weeks in Treatment: 20 Active Inactive Wound/Skin Impairment Nursing Diagnoses: Impaired tissue integrity Goals: Ulcer/skin breakdown will have a volume reduction of 30% by week 4 Date Initiated: 08/03/2022 Date Inactivated: 09/10/2022 Target Resolution Date: 08/31/2022 Unmet Reason: uncontrolled edema, Goal Status: Unmet acute infection Ulcer/skin breakdown will have a volume  reduction of 50% by week 8 Date Initiated: 09/10/2022 Target Resolution Date: 04/05/2023 Goal Status: Active Interventions: Assess ulceration(s) every visit Treatment  Activities: Skin care regimen initiated : 08/03/2022 Amber, [name] ([id number]) [phone].pdf Page 5 of 9 Notes: Keystone ordered. 10/30 RX Levo started. 09/10/22 Electronic Signature(s) Signed: 12/19/2022 5:21:39 PM By: Amber Catholic RN Entered By: [name] on 12/19/2022 14:02:19 -------------------------------------------------------------------------------- Pain Assessment Details Patient Name: Date of Service: Amber, [name] 12/19/2022 1:30 PM Medical Record Number: [id number] Patient Account Number: [id number] Date of Birth/Sex: Treating RN: July 18, 1946 (77 y.o. F) Primary Care Bayley Hurn: [name] Other Clinician: Referring Juno Bozard: Treating Tanairi Cypert/Extender: [name] Session in Treatment: 20 Active Problems Location of Pain Severity and Description of Pain Patient Has Paino No Site Locations Pain Management and Medication Current Pain Management: Electronic Signature(s) Signed: 12/19/2022 3:10:42 PM By: [name] Entered By: [name] on 12/19/2022 13:27:34 -------------------------------------------------------------------------------- Patient/Caregiver Education Details Patient Name: Date of Service: Gonzalez, Landen J. 2/14/2024andnbsp1:30 PM Medical Record Number: [id number] Patient Account Number: [id number] Date of Birth/Gender: Treating RN: 03-24-1946 (77 y.o. [name] Primary Care Physician: [name] Other Clinician: Referring Physician: Treating Physician/Extender: [name] Session in Treatment: [address] JARIEL, FARID ([id number]) [id number] Page 6 of 9 Education Provided To: Patient Education Topics Provided Wound/Skin Impairment: Methods: Explain/Verbal Responses: Return demonstration correctly Electronic Signature(s) Signed: 12/19/2022 5:21:39 PM By: Amber Catholic RN Entered By: [name] on  12/19/2022 15:29:21 -------------------------------------------------------------------------------- Wound Assessment Details Patient Name: Date of Service: Amber, [name] 12/19/2022 1:30 PM Medical Record Number: [id number] Patient Account Number: [id number] Date of Birth/Sex: Treating RN: 1945-11-27 (77 y.o. F) Primary Care Cedarius Kersh: [name] Other Clinician: Referring Brittlyn Cloe: Treating Rachael Ferrie/Extender: [name] Weeks in Treatment: 20 Wound Status Wound Number: 2 Primary Venous Leg Ulcer Etiology: Wound Location: Left, Posterior Lower Leg Wound Open Wounding Event: Blister Status: Date Acquired: 07/06/2022 Comorbid Cataracts, Asthma, Hypertension, Neuropathy, Received Weeks Of Treatment: 20 History: Chemotherapy, Received Radiation Clustered Wound: No Photos Wound Measurements Length: (cm) 5 Width: (cm) 2 Depth: (cm) 0.1 Area: (cm) 7.854 Volume: (cm) 0.785 % Reduction in Area: -14.3% % Reduction in Volume: -14.3% Epithelialization: Small (1-33%) Wound Description Classification: Full Thickness Without Exposed Support Structures Wound Margin: Flat and Intact Exudate Amount: Medium Exudate Type: Serosanguineous Exudate Color: red, brown Foul Odor After Cleansing: No Slough/Fibrino Yes Wound Bed Granulation Amount: Small (1-33%) Exposed Structure Granulation Quality: Red Fascia Exposed: No Necrotic Amount: Large (67-100%) Fat Layer (Subcutaneous Tissue) Exposed: Yes Necrotic Quality: Eschar, Adherent Slough Tendon Exposed: No Amber, [name] ([id number]) [id number] Page 7 of 9 Muscle Exposed: No Joint Exposed: No Bone Exposed: No Periwound Skin Texture Texture Color No Abnormalities Noted: Yes No Abnormalities Noted: Yes Moisture Temperature / Pain No Abnormalities Noted: Yes Temperature: No Abnormality Tenderness on Palpation: Yes Treatment Notes Wound #2 (Lower Leg) Wound Laterality: Left,  Posterior Cleanser Soap and Water Discharge Instruction: May shower and wash wound with dial antibacterial soap and water prior to dressing change. Peri-Wound Care Sween Lotion (Moisturizing lotion) Discharge Instruction: Apply moisturizing lotion as directed Topical Keystone Discharge Instruction: Apply as directed to wound Primary Dressing Santyl Ointment Discharge Instruction: Apply nickel thick amount to wound bed as instructed Secondary Dressing T Non-adherent Dressing, 2x3 in elfa Discharge Instruction: Apply over primary dressing as directed. Zetuvit Plus 4x8 in Discharge Instruction: Apply over primary dressing as directed. Secured With [employer] Surgical T 2x10 (in/yd) ape Discharge Instruction: Secure with tape as directed. Compression Wrap  Unnaboot w/Calamine, 4x10 (in/yd) Discharge Instruction: Apply Unnaboot as directed. Compression Stockings Add-Ons Electronic Signature(s) Signed: 12/19/2022 3:10:42 PM By: [name] Entered By: [name] on 12/19/2022 13:43:34 -------------------------------------------------------------------------------- Wound Assessment Details Patient Name: Date of Service: Amber, [name] 12/19/2022 1:30 PM Medical Record Number: [id number] Patient Account Number: [id number] Date of Birth/Sex: Treating RN: June 26, 1946 (78 y.o. F) Primary Care Camala Talwar: [name] Other Clinician: Referring Muskaan Smet: Treating Cloria [name]/Extender: [name] Weeks in Treatment: 20 Wound Status Wound Number: 3 Primary Venous Leg Ulcer Etiology: Wound Location: Right, Lateral Ankle Wound Open Wounding Event: Contusion/Bruise Amber, [name] ([id number]) [id number] Page 8 of 9 Wounding Event: Contusion/Bruise Status: Date Acquired: 10/19/2022 Comorbid Cataracts, Asthma, Hypertension, Neuropathy, Received Weeks Of Treatment: 8 History: Chemotherapy, Received Radiation Clustered  Wound: No Photos Wound Measurements Length: (cm) 0.1 Width: (cm) 0.1 Depth: (cm) 0.1 Area: (cm) 0.008 Volume: (cm) 0.001 % Reduction in Area: 99.7% % Reduction in Volume: 99.6% Epithelialization: Small (1-33%) Wound Description Classification: Full Thickness Without Exposed Support Structures Wound Margin: Flat and Intact Exudate Amount: Medium Exudate Type: Serosanguineous Exudate Color: red, brown Foul Odor After Cleansing: No Slough/Fibrino Yes Wound Bed Granulation Amount: Medium (34-66%) Exposed Structure Necrotic Amount: Medium (34-66%) Fascia Exposed: No Necrotic Quality: Adherent Slough Fat Layer (Subcutaneous Tissue) Exposed: Yes Tendon Exposed: No Muscle Exposed: No Joint Exposed: No Bone Exposed: No Periwound Skin Texture Texture Color No Abnormalities Noted: Yes No Abnormalities Noted: No Erythema: No Moisture No Abnormalities Noted: Yes Temperature / Pain Temperature: No Abnormality Tenderness on Palpation: Yes Treatment Notes Wound #3 (Ankle) Wound Laterality: Right, Lateral Cleanser Soap and Water Discharge Instruction: May shower and wash wound with dial antibacterial soap and water prior to dressing change. Peri-Wound Care Sween Lotion (Moisturizing lotion) Discharge Instruction: Apply moisturizing lotion as directed Topical Keystone Discharge Instruction: Apply as directed to wound Primary Dressing Santyl Ointment Discharge Instruction: Apply nickel thick amount to wound bed as instructed Secondary Dressing T Non-adherent Dressing, 2x3 in elfa Discharge Instruction: Apply over primary dressing as directed. Amber, [name] ([id number]) [id number] Page 9 of 9 Woven Gauze Sponge, Non-Sterile 4x4 in Discharge Instruction: Apply over primary dressing as directed. Secured With [employer] Surgical T 2x10 (in/yd) ape Discharge Instruction: Secure with tape as directed. Compression Wrap Unnaboot  w/Calamine, 4x10 (in/yd) Discharge Instruction: Apply Unnaboot as directed. Compression Stockings Add-Ons Electronic Signature(s) Signed: 12/19/2022 3:10:42 PM By: [name] Entered By: [name] on 12/19/2022 13:45:21 -------------------------------------------------------------------------------- Vitals Details Patient Name: Date of Service: [name]. 12/19/2022 1:30 PM Medical Record Number: [id number] Patient Account Number: [id number] Date of Birth/Sex: Treating RN: 05/22/1946 (77 y.o. F) Primary Care Ante Arredondo: [name] Other Clinician: Referring Salathiel Ferrara: Treating Cloyde Oregel/Extender: [name] Session in Treatment: 20 Vital Signs Time Taken: 01:26 Temperature (F): 97.8 Height (in): 63 Pulse (bpm): 71 Weight (lbs): 200 Respiratory Rate (breaths/min): 20 Body Mass Index (BMI): 35.4 Blood Pressure (mmHg): 151/82 Reference Range: 80 - 120 mg / dl Electronic Signature(s) Signed: 12/19/2022 3:10:42 PM By: [name] Entered By: [name] on 12/19/2022 13:27:26

## 2022-12-21 NOTE — Progress Notes (Signed)
SABRIA, SEEKINGS ([id number]) [id number] Page 1 of 12 Visit Report for 12/19/2022 Chief Complaint Document Details Patient Name: Date of Service: [name], [name] 12/19/2022 1:30 PM Medical Record Number: [id number] Patient Account Number: [id number] Date of Birth/Sex: Treating RN: 1945/11/20 (77 y.o. F) Primary Care Provider: [name] Other Clinician: Referring Provider: Treating Provider/Extender: [name] Session in Treatment: 20 Information Obtained from: Patient Chief Complaint Patient presents for treatment of an open ulcer due to venous insufficiency Electronic Signature(s) Signed: 12/19/2022 2:06:48 PM By: Fredirick Maudlin MD FACS Entered By: Fredirick Maudlin on 12/19/2022 14:06:48 -------------------------------------------------------------------------------- Debridement Details Patient Name: Date of Service: [name]. 12/19/2022 1:30 PM Medical Record Number: [id number] Patient Account Number: [id number] Date of Birth/Sex: Treating RN: 14-Apr-1946 (77 y.o. [name] Primary Care Provider: [name] Other Clinician: Referring Provider: Treating Provider/Extender: [name] Session in Treatment: 20 Brown Primary Care Provider: [name] Other Clinician: Referring Provider: Treating Provider/Extender: [name] Session in Treatment: 20 Debridement Performed for Assessment: Wound #3 Right,Lateral Ankle Performed By: Physician Fredirick Maudlin, MD Debridement Type: Debridement Severity of Tissue Pre Debridement: Fat layer exposed Level of Consciousness (Pre-procedure): Awake and Alert Pre-procedure Verification/Time Out Yes - 13:50 Taken: Start Time: 13:50 Pain Control: Lidocaine 5% topical ointment T Area Debrided (L x W): otal 0.1 (cm) x 0.1 (cm) = 0.01 (cm) Tissue and other material debrided: Non-Viable, Slough, Slough Level: Non-Viable Tissue Debridement Description: Selective/Open Wound Instrument: Curette Bleeding: Minimum Hemostasis Achieved: Pressure End Time: 13:52 Procedural Pain: 0 Post  Procedural Pain: 0 Response to Treatment: Procedure was tolerated well Level of Consciousness (Post- Awake and Alert procedure): Post Debridement Measurements of Total Wound Length: (cm) 0.1 Width: (cm) 0.1 Depth: (cm) 0.1 Volume: (cm) 0.001 Character of Wound/Ulcer Post Debridement: Improved Severity of Tissue Post Debridement: Fat layer exposed KASHAY, SWIGERT ([id number]) [id number] Page 2 of 12 Post Procedure Diagnosis Same as Pre-procedure Notes Scribed for Dr. Celine Ahr by J.Scotton Electronic Signature(s) Signed: 12/19/2022 3:59:07 PM By: Fredirick Maudlin MD FACS Signed: 12/19/2022 5:21:39 PM By: Dellie Catholic RN Entered By: Dellie Catholic on 12/19/2022 13:58:50 -------------------------------------------------------------------------------- Debridement Details Patient Name: Date of Service: [name], [name] 12/19/2022 1:30 PM Medical Record Number: [id number] Patient Account Number: [id number] Date of Birth/Sex: Treating RN: 1946-08-28 (77 y.o. [name] Primary Care Provider: [name] Other Clinician: Referring Provider: Treating Provider/Extender: [name] Session in Treatment: 20 Brown Primary Care Provider: [name] Other Clinician: Referring Provider: Treating Provider/Extender: [name] Session in Treatment: 20 Debridement Performed for Assessment: Wound #2 Left,Posterior Lower Leg Performed By: Physician Fredirick Maudlin, MD Debridement Type: Debridement Severity of Tissue Pre Debridement: Fat layer exposed Level of Consciousness (Pre-procedure): Awake and Alert Pre-procedure Verification/Time Out Yes - 13:50 Taken: Start Time: 13:50 Pain Control: Lidocaine 5% topical ointment T Area Debrided (L x W): otal 5 (cm) x 2 (cm) = 10 (cm) Tissue and other material debrided: Non-Viable, Slough, Slough Level: Non-Viable Tissue Debridement Description: Selective/Open Wound Instrument: Curette Bleeding: Minimum Hemostasis Achieved: Pressure End Time: 13:52 Procedural Pain: 0 Post Procedural Pain: 0 Response to Treatment: Procedure  was tolerated well Level of Consciousness (Post- Awake and Alert procedure): Post Debridement Measurements of Total Wound Length: (cm) 5 Width: (cm) 2 Depth: (cm) 0.1 Volume: (cm) 0.785 Character of Wound/Ulcer Post Debridement: Improved Severity of Tissue Post Debridement: Fat layer exposed Post Procedure Diagnosis Same as Pre-procedure Notes scribed for Dr. Celine Ahr by J.Scotton Electronic Signature(s) Signed: 12/19/2022 3:59:07 PM By: Fredirick Maudlin MD FACS Signed: 12/19/2022 5:21:39 PM By: Dellie Catholic RN Entered By: Dellie Catholic on 12/19/2022 14:01:01 [name] [id number] Page 3 of 12 -------------------------------------------------------------------------------- HPI Details Patient Name: Date of Service: [name], [name]. 12/19/2022 1:30  PM Medical Record Number: [id number] Patient Account Number: [id number] Date of Birth/Sex: Treating RN: 1946-06-21 (77 y.o. F) Primary Care Provider: [name] Other Clinician: Referring Provider: Treating Provider/Extender: [name] Session in Treatment: 20 History of Present Illness HPI Description: ADMISSION This is a 77 year old woman with a history of chronic venous insufficiency status post saphenous vein ablations in 2010 and 2016. 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 She presents today with an open venous ulcer on her left lower leg. It has been present for a little over 2 weeks. She saw her primary care provider who apparently swabbed the wound and grew out Pseudomonas. She just completed a course of ciprofloxacin for this. ABI was 0.91. She reports that she has had previous issues with ulcers in this same location, stemming back to a punch biopsy taken by a dermatologist many years ago. She has had several skin substitutes applied to the area that have ultimately resulted in healing on prior occasions. She has 2 small  ulcers on her left medial lower leg. There is slough accumulation in both of them. The more anterior of the 2 is quite small and has some soft slough on the surface, underneath which there is good granulation tissue. The more medial wound also has slough accumulation, but the underlying surface is fairly fibrotic and gritty. This is consistent with her provided history of multiple ulcers in the same location. 08/03/2022: The anterior wound is smaller today with just a little bit of slough accumulation. The more medial wound continues to be very sensitive and fibrotic with slough buildup. 08/13/2022: The anterior wound is closed. The more medial wound remains sensitive with a fairly fibrotic surface. There is more granulation tissue filling in, however, and there is less slough than on prior occasions. 08/20/2022: The anterior wound remains closed. The more medial wound has filled with granulation tissue but still has a fair amount of slough on the surface and remains fairly tender. Unfortunately, she has developed 2 small ulcers just proximal to this. The fat layer is exposed in each. She says that over the weekend, she felt a burning sensation in that location. 08/27/2022: The 2 small wounds proximal to the main wound have merged into a single site. The wounds look a little bit dry, but they are quite clean without significant slough accumulation. They remain quite tender. 09/03/2022: All of the wounds have now merged into 1 large wound. There is a strong odor coming from the wound and the surface is not particularly viable. It is extremely painful for her today. 09/10/2022: The wound is less black and purple this week but still does not look particularly viable. The surface is desiccated. The culture that I took last week returned with a polymicrobial population including Pseudomonas. I prescribed both Augmentin and levofloxacin. She has not yet picked up levofloxacin, as her pharmacy only notified  her yesterday that it was ready. We have ordered a Keystone topical compounded antibiotic, but it has not yet come. 09/17/2022: The wound surface is markedly improved today. There is still an area of grayish-looking muscle but the rest appears significantly more viable. There is still a layer of slough on the surface. She still has a couple days left of oral antibiotic therapy. She has her [employer] compounded topical antibiotic with her today. 09/24/2022: She has completed her oral antibiotic therapy. The wound surface is much cleaner today and more viable without any necrotic tissue. It is a bit desiccated, however. 10/01/2022:  The moisture balance of the wound has improved. There is a layer of yellow slough on the surface, but beneath this, the wound is more pink. 10/08/2022: The wound is smaller and cleaner today with a layer of slough present. She is having less pain. 12/18; this patient has a new wound on the right lateral ankle which apparently started after a cat scratched her. Secondarily she managed to drop a cake pan on the same area. This is very painful. The original wound was on the left posterior calf we have been using [employer] under an [employer]. The Unna boot fell down and apparently she was in for a nurse visit perhaps last week and that 1 fell down as well This patient has central venous mediated hypertension. For member correctly she has a stent placed in the left common iliac artery by Dr. Donzetta Matters some years ago [facility] Thurner syndrome] she also has had venous ablations and I believe a history of a DVT The patient has compression pumps at home but she does not use them 10/30/2022: The patient says that her wounds burned all week with the [facility] classic in place. She has a fair amount of slough and nonviable subcutaneous tissue present on the right ankle. The left posterior calf wound is cleaner than I have seen it to date. Both remain fairly tender. 11/14/2022: Both  wounds have substantial slough and eschar accumulation. They remain extremely tender. 11/21/2022: The wounds are cleaner this week, but still have slough and eschar accumulation. She continues to describe burning pain in both wounds, but upon further questioning, she actually has burning in both of her feet that radiates up her legs and includes to the wounds. 11/28/2022: Both wounds have slough and eschar accumulation, as well as adherent silver alginate that was difficult to remove. She continues to have pain out of proportion to the extent of her wounds. Her PCP did initiate Lyrica which she started taking last night. The dose is quite low, only 25 mg, but apparently there is a plan for upward titration, assuming she tolerates the medication. 12/05/2022: The wounds are both a little bit smaller today, but have significant slough accumulation, as usual. They remain exquisitely tender. She is now taking Lyrica twice a day. 12/19/2022: Both of her wounds look significantly better this week. There is slough buildup on both, but they are smaller. She seems to be getting a good response from Lyrica as she is having much less pain. DACIA, HALLEY ([id number]) [id number] Page 4 of 12 Electronic Signature(s) Signed: 12/19/2022 2:07:28 PM By: Fredirick Maudlin MD FACS Entered By: Fredirick Maudlin on 12/19/2022 14:07:27 -------------------------------------------------------------------------------- Physical Exam Details Patient Name: Date of Service: [name], [name] 12/19/2022 1:30 PM Medical Record Number: [id number] Patient Account Number: [id number] Date of Birth/Sex: Treating RN: 1946-05-22 (77 y.o. F) Primary Care Provider: [name] Other Clinician: Referring Provider: Treating Provider/Extender: [name] Session in Treatment: 20 Constitutional Hypertensive, asymptomatic. . . . no acute distress. Respiratory Normal work of breathing on  room air. Notes 12/19/2022: Both of her wounds look significantly better this week. There is slough buildup on both, but they are smaller. Electronic Signature(s) Signed: 12/19/2022 2:08:56 PM By: Fredirick Maudlin MD FACS Entered By: Fredirick Maudlin on 12/19/2022 14:08:56 -------------------------------------------------------------------------------- Physician Orders Details Patient Name: Date of Service: AKAYA, HAINS 12/19/2022 1:30 PM Medical Record Number: [id number] Patient Account Number: [id number] Date of Birth/Sex: Treating RN: [birth year] (77 y.o. [name] Primary Care Provider: [name] Other Clinician:  Referring Provider: Treating Provider/Extender: [name] Session in Treatment: 20 Verbal / Phone Orders: No Diagnosis Coding ICD-10 Coding Code Description [phone] Non-pressure chronic ulcer of other part of left lower leg with fat layer exposed L97.818 Non-pressure chronic ulcer of other part of right lower leg with other specified severity I83.893 Varicose veins of bilateral lower extremities with other complications [id number] Venous insufficiency (chronic) (peripheral) G62.9 Polyneuropathy, unspecified R60.0 Localized edema L03.115 Cellulitis of right lower limb Follow-up Appointments ppointment in 1 week. - Dr. Celine Ahr RM 3 Return A 01/02/23 at 2:15pm Anesthetic Wound #2 Left,Posterior Lower Leg GELENA, BRYAND ([id number]) [id number] Page 5 of 12 (In clinic) Topical Lidocaine 5% applied to wound bed - in clinic, prior to debridement (In clinic) Topical Lidocaine 4% applied to wound bed Bathing/ Shower/ Hygiene May shower with protection but do not get wound dressing(s) wet. Protect dressing(s) with water repellant cover (for example, large plastic bag) or a cast cover and may then take shower. Edema Control - Lymphedema / SCD / Other Left Lower Extremity Lymphedema Pumps. Use Lymphedema pumps on  leg(s) 2-3 times a day for 45-60 minutes. If wearing any wraps or hose, do not remove them. Continue exercising as instructed. - Use x1 per day Avoid standing for long periods of time. Exercise regularly Wound Treatment Wound #2 - Lower Leg Wound Laterality: Left, Posterior Cleanser: Soap and Water 1 x Per Week/30 Days Discharge Instructions: May shower and wash wound with dial antibacterial soap and water prior to dressing change. Peri-Wound Care: Sween Lotion (Moisturizing lotion) 1 x Per Week/30 Days Discharge Instructions: Apply moisturizing lotion as directed Topical: Keystone 1 x Per Week/30 Days Discharge Instructions: Apply as directed to wound Prim Dressing: Santyl Ointment 1 x Per Week/30 Days ary Discharge Instructions: Apply nickel thick amount to wound bed as instructed Secondary Dressing: T Non-adherent Dressing, 2x3 in 1 x Per Week/30 Days elfa Discharge Instructions: Apply over primary dressing as directed. Secondary Dressing: Zetuvit Plus 4x8 in 1 x Per Week/30 Days Discharge Instructions: Apply over primary dressing as directed. Secured With: 39M Medipore Public affairs consultant Surgical T 2x10 (in/yd) 1 x Per Week/30 Days ape Discharge Instructions: Secure with tape as directed. Compression Wrap: Unnaboot w/Calamine, 4x10 (in/yd) 1 x Per Week/30 Days Discharge Instructions: Apply Unnaboot as directed. Wound #3 - Ankle Wound Laterality: Right, Lateral Cleanser: Soap and Water 1 x Per Week/30 Days Discharge Instructions: May shower and wash wound with dial antibacterial soap and water prior to dressing change. Peri-Wound Care: Sween Lotion (Moisturizing lotion) 1 x Per Week/30 Days Discharge Instructions: Apply moisturizing lotion as directed Topical: Keystone 1 x Per Week/30 Days Discharge Instructions: Apply as directed to wound Prim Dressing: Santyl Ointment 1 x Per Week/30 Days ary Discharge Instructions: Apply nickel thick amount to wound bed as instructed Secondary  Dressing: T Non-adherent Dressing, 2x3 in 1 x Per Week/30 Days elfa Discharge Instructions: Apply over primary dressing as directed. Secondary Dressing: Woven Gauze Sponge, Non-Sterile 4x4 in 1 x Per Week/30 Days Discharge Instructions: Apply over primary dressing as directed. Secured With: 39M Medipore Public affairs consultant Surgical T 2x10 (in/yd) 1 x Per Week/30 Days ape Discharge Instructions: Secure with tape as directed. Compression Wrap: Unnaboot w/Calamine, 4x10 (in/yd) 1 x Per Week/30 Days Discharge Instructions: Apply Unnaboot as directed. Electronic Signature(s) Signed: 12/19/2022 3:59:07 PM By: Fredirick Maudlin MD FACS Previous Signature: 12/19/2022 1:52:57 PM Version By: Fredirick Maudlin MD FACS Entered By: Fredirick Maudlin on 12/19/2022 14:09:44 [name] [id number] Page 6 of  12 -------------------------------------------------------------------------------- Problem List Details Patient Name: Date of Service: [name], [name] 12/19/2022 1:30 PM Medical Record Number: [id number] Patient Account Number: [id number] Date of Birth/Sex: Treating RN: May 20, 1946 (77 y.o. F) Primary Care Provider: [name] Other Clinician: Referring Provider: Treating Provider/Extender: [name] Session in Treatment: 20 Active Problems ICD-10 Encounter Code Description Active Date MDM Diagnosis [phone] Non-pressure chronic ulcer of other part of left lower leg with fat layer exposed9/22/2023 No Yes L97.818 Non-pressure chronic ulcer of other part of right lower leg with other specified 10/22/2022 No Yes severity I83.893 Varicose veins of bilateral lower extremities with other complications 0000000 No Yes I87.2 Venous insufficiency (chronic) (peripheral) 07/27/2022 No Yes G62.9 Polyneuropathy, unspecified 07/27/2022 No Yes R60.0 Localized edema 07/27/2022 No Yes L03.115 Cellulitis of right lower limb 10/22/2022 No Yes Inactive  Problems Resolved Problems Electronic Signature(s) Signed: 12/19/2022 2:05:44 PM By: Fredirick Maudlin MD FACS Entered By: Fredirick Maudlin on 12/19/2022 14:05:43 -------------------------------------------------------------------------------- Progress Note Details Patient Name: Date of Service: [name]. 12/19/2022 1:30 PM Medical Record Number: [id number] Patient Account Number: [id number] Date of Birth/Sex: Treating RN: 10-08-1946 (77 y.o. F) Primary Care Provider: [name] Other Clinician: Referring Provider: Treating Provider/Extender: [name] Session in Treatment: [address], [place] ([id number]) [id number] Page 7 of 12 Subjective Chief Complaint Information obtained from Patient Patient presents for treatment of an open ulcer due to venous insufficiency History of Present Illness (HPI) ADMISSION This is a 77 year old woman with a history of chronic venous insufficiency status post saphenous vein ablations in 2010 and 2016. 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 She presents today with an open venous ulcer on her left lower leg. It has been present for a little over 2 weeks. She saw her primary care provider who apparently swabbed the wound and grew out Pseudomonas. She just completed a course of ciprofloxacin for this. ABI was 0.91. She reports that she has had previous issues with ulcers in this same location, stemming back to a punch biopsy taken by a dermatologist many years ago. She has had several skin substitutes applied to the area that have ultimately resulted in healing on prior occasions. She has 2 small ulcers on her left medial lower leg. There is slough accumulation in both of them. The more anterior of the 2 is quite small and has some soft slough on the surface, underneath which there is good granulation tissue. The more medial wound also has slough accumulation, but the  underlying surface is fairly fibrotic and gritty. This is consistent with her provided history of multiple ulcers in the same location. 08/03/2022: The anterior wound is smaller today with just a little bit of slough accumulation. The more medial wound continues to be very sensitive and fibrotic with slough buildup. 08/13/2022: The anterior wound is closed. The more medial wound remains sensitive with a fairly fibrotic surface. There is more granulation tissue filling in, however, and there is less slough than on prior occasions. 08/20/2022: The anterior wound remains closed. The more medial wound has filled with granulation tissue but still has a fair amount of slough on the surface and remains fairly tender. Unfortunately, she has developed 2 small ulcers just proximal to this. The fat layer is exposed in each. She says that over the weekend, she felt a burning sensation in that location. 08/27/2022: The 2 small wounds proximal to the main wound have merged into a single site. The wounds look a little bit dry, but they  are quite clean without significant slough accumulation. They remain quite tender. 09/03/2022: All of the wounds have now merged into 1 large wound. There is a strong odor coming from the wound and the surface is not particularly viable. It is extremely painful for her today. 09/10/2022: The wound is less black and purple this week but still does not look particularly viable. The surface is desiccated. The culture that I took last week returned with a polymicrobial population including Pseudomonas. I prescribed both Augmentin and levofloxacin. She has not yet picked up levofloxacin, as her pharmacy only notified her yesterday that it was ready. We have ordered a Keystone topical compounded antibiotic, but it has not yet come. 09/17/2022: The wound surface is markedly improved today. There is still an area of grayish-looking muscle but the rest appears significantly more viable. There  is still a layer of slough on the surface. She still has a couple days left of oral antibiotic therapy. She has her [employer] compounded topical antibiotic with her today. 09/24/2022: She has completed her oral antibiotic therapy. The wound surface is much cleaner today and more viable without any necrotic tissue. It is a bit desiccated, however. 10/01/2022: The moisture balance of the wound has improved. There is a layer of yellow slough on the surface, but beneath this, the wound is more pink. 10/08/2022: The wound is smaller and cleaner today with a layer of slough present. She is having less pain. 12/18; this patient has a new wound on the right lateral ankle which apparently started after a cat scratched her. Secondarily she managed to drop a cake pan on the same area. This is very painful. The original wound was on the left posterior calf we have been using [employer] under an [employer]. The Unna boot fell down and apparently she was in for a nurse visit perhaps last week and that 1 fell down as well This patient has central venous mediated hypertension. For member correctly she has a stent placed in the left common iliac artery by Dr. Donzetta Matters some years ago [facility] Thurner syndrome] she also has had venous ablations and I believe a history of a DVT The patient has compression pumps at home but she does not use them 10/30/2022: The patient says that her wounds burned all week with the [facility] classic in place. She has a fair amount of slough and nonviable subcutaneous tissue present on the right ankle. The left posterior calf wound is cleaner than I have seen it to date. Both remain fairly tender. 11/14/2022: Both wounds have substantial slough and eschar accumulation. They remain extremely tender. 11/21/2022: The wounds are cleaner this week, but still have slough and eschar accumulation. She continues to describe burning pain in both wounds, but upon further questioning, she actually has  burning in both of her feet that radiates up her legs and includes to the wounds. 11/28/2022: Both wounds have slough and eschar accumulation, as well as adherent silver alginate that was difficult to remove. She continues to have pain out of proportion to the extent of her wounds. Her PCP did initiate Lyrica which she started taking last night. The dose is quite low, only 25 mg, but apparently there is a plan for upward titration, assuming she tolerates the medication. 12/05/2022: The wounds are both a little bit smaller today, but have significant slough accumulation, as usual. They remain exquisitely tender. She is now taking Lyrica twice a day. 12/19/2022: Both of her wounds look significantly better this week.  There is slough buildup on both, but they are smaller. She seems to be getting a good response from Lyrica as she is having much less pain. Patient History Information obtained from Patient. Family History Cancer - Mother,Paternal Grandparents, Diabetes - Siblings, Heart Disease - Father, Hypertension - Father. Social History Former smoker - quit in 1967. Medical History Eyes STEPHANNIE, KOBAYASHI ([id number]) [id number] Page 8 of 12 Patient has history of Cataracts - L eye Ear/Nose/Mouth/Throat Denies history of Chronic sinus problems/congestion, Middle ear problems Hematologic/Lymphatic Denies history of Anemia, Human Immunodeficiency Virus, Lymphedema Respiratory Patient has history of Asthma Cardiovascular Patient has history of Hypertension Endocrine Denies history of Type I Diabetes, Type II Diabetes Neurologic Patient has history of Neuropathy - Feet and finger Denies history of Seizure Disorder Oncologic Patient has history of Received Chemotherapy - 2012, Received Radiation - 2012 Hospitalization/Surgery History - Lower extremity venography 2020. - Intravascular ultrasound. - peripheral vascular intervention. - melanoma 2011. 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 no acute distress. Vitals Time Taken: 1:26 AM, Height: 63 in, Weight: 200 lbs, BMI: 35.4, Temperature: 97.8 F, Pulse: 71 bpm, Respiratory Rate: 20 breaths/min, Blood Pressure: 151/82 mmHg. Respiratory Normal work of breathing on room air. General Notes: 12/19/2022: Both of her wounds look significantly better this week. There is slough buildup on both, but they are smaller. Integumentary (Hair, Skin) Wound #2 status is Open. Original cause of wound was Blister. The date acquired was: 07/06/2022. The wound has been in treatment 20 weeks. The wound is located on the Left,Posterior Lower Leg. The wound measures 5cm length x 2cm width x 0.1cm depth; 7.854cm^2 area and 0.785cm^3 volume. There is Fat Layer (Subcutaneous Tissue) exposed. There is a medium amount of serosanguineous drainage noted. The wound margin is flat and intact. There is small (1- 33%) red granulation within the wound bed. There is a large (67-100%) amount of necrotic tissue within the wound bed including Eschar and Adherent Slough. The periwound skin appearance had no abnormalities noted for texture. The periwound skin appearance had no abnormalities noted for moisture. The periwound skin appearance had no abnormalities noted for color. Periwound temperature was noted as No Abnormality. The periwound has tenderness on palpation. Wound #3 status is Open. Original cause of wound was Contusion/Bruise. The date acquired was: 10/19/2022. The wound has been in treatment 8 weeks. The wound is located on the Right,Lateral Ankle. The wound measures 0.1cm length x 0.1cm width x 0.1cm depth; 0.008cm^2 area and 0.001cm^3 volume. There is Fat Layer  (Subcutaneous Tissue) exposed. There is a medium amount of serosanguineous drainage noted. The wound margin is flat and intact. There is medium (34-66%) granulation within the wound bed. There is a medium (34-66%) amount of necrotic tissue within the wound bed including Adherent Slough. The periwound skin appearance had no abnormalities noted for texture. The periwound skin appearance had no abnormalities noted for moisture. The periwound skin appearance did not exhibit: Erythema. Periwound temperature was noted as No Abnormality. The periwound has tenderness on palpation. Assessment Active Problems ICD-10 Non-pressure chronic ulcer of other part of left lower leg with fat layer exposed Non-pressure chronic ulcer of other part of right lower leg with other specified severity Varicose veins of bilateral lower extremities with other complications Venous insufficiency (chronic) (peripheral) Polyneuropathy, unspecified Localized edema Cellulitis of right lower limb Ledwell, Nilah  J ([id number]) [id number] Page 9 of 12 Procedures Wound #2 Pre-procedure diagnosis of Wound #2 is a Venous Leg Ulcer located on the Left,Posterior Lower Leg .Severity of Tissue Pre Debridement is: Fat layer exposed. There was a Selective/Open Wound Non-Viable Tissue Debridement with a total area of 10 sq cm performed by Fredirick Maudlin, MD. With the following instrument(s): Curette to remove Non-Viable tissue/material. Material removed includes [facility] after achieving pain control using Lidocaine 5% topical ointment. No specimens were taken. A time out was conducted at 13:50, prior to the start of the procedure. A Minimum amount of bleeding was controlled with Pressure. The procedure was tolerated well with a pain level of 0 throughout and a pain level of 0 following the procedure. Post Debridement Measurements: 5cm length x 2cm width x 0.1cm depth; 0.785cm^3 volume. Character of Wound/Ulcer  Post Debridement is improved. Severity of Tissue Post Debridement is: Fat layer exposed. Post procedure Diagnosis Wound #2: Same as Pre-Procedure General Notes: scribed for Dr. Celine Ahr by J.Scotton. Pre-procedure diagnosis of Wound #2 is a Venous Leg Ulcer located on the Left,Posterior Lower Leg . There was a Haematologist Compression Therapy Procedure by Dellie Catholic, RN. Post procedure Diagnosis Wound #2: Same as Pre-Procedure Wound #3 Pre-procedure diagnosis of Wound #3 is a Venous Leg Ulcer located on the Right,Lateral Ankle .Severity of Tissue Pre Debridement is: Fat layer exposed. There was a Selective/Open Wound Non-Viable Tissue Debridement with a total area of 0.01 sq cm performed by Fredirick Maudlin, MD. With the following instrument(s): Curette to remove Non-Viable tissue/material. Material removed includes [facility] after achieving pain control using Lidocaine 5% topical ointment. No specimens were taken. A time out was conducted at 13:50, prior to the start of the procedure. A Minimum amount of bleeding was controlled with Pressure. The procedure was tolerated well with a pain level of 0 throughout and a pain level of 0 following the procedure. Post Debridement Measurements: 0.1cm length x 0.1cm width x 0.1cm depth; 0.001cm^3 volume. Character of Wound/Ulcer Post Debridement is improved. Severity of Tissue Post Debridement is: Fat layer exposed. Post procedure Diagnosis Wound #3: Same as Pre-Procedure General Notes: Scribed for Dr. Celine Ahr by J.Scotton. Pre-procedure diagnosis of Wound #3 is a Venous Leg Ulcer located on the Right,Lateral Ankle . There was a Haematologist Compression Therapy Procedure by Dellie Catholic, RN. Post procedure Diagnosis Wound #3: Same as Pre-Procedure Plan Follow-up Appointments: Return Appointment in 1 week. - Dr. Celine Ahr RM 3 01/02/23 at 2:15pm Anesthetic: Wound #2 Left,Posterior Lower Leg: (In clinic) Topical Lidocaine 5% applied to wound bed - in clinic,  prior to debridement (In clinic) Topical Lidocaine 4% applied to wound bed Bathing/ Shower/ Hygiene: May shower with protection but do not get wound dressing(s) wet. Protect dressing(s) with water repellant cover (for example, large plastic bag) or a cast cover and may then take shower. Edema Control - Lymphedema / SCD / Other: Lymphedema Pumps. Use Lymphedema pumps on leg(s) 2-3 times a day for 45-60 minutes. If wearing any wraps or hose, do not remove them. Continue exercising as instructed. - Use x1 per day Avoid standing for long periods of time. Exercise regularly WOUND #2: - Lower Leg Wound Laterality: Left, Posterior Cleanser: Soap and Water 1 x Per Week/30 Days Discharge Instructions: May shower and wash wound with dial antibacterial soap and water prior to dressing change. Peri-Wound Care: Sween Lotion (Moisturizing lotion) 1 x Per Week/30 Days Discharge Instructions: Apply moisturizing lotion as directed Topical: Keystone 1 x Per Week/30 Days Discharge Instructions:  Apply as directed to wound Prim Dressing: Santyl Ointment 1 x Per Week/30 Days ary Discharge Instructions: Apply nickel thick amount to wound bed as instructed Secondary Dressing: T Non-adherent Dressing, 2x3 in 1 x Per Week/30 Days elfa Discharge Instructions: Apply over primary dressing as directed. Secondary Dressing: Zetuvit Plus 4x8 in 1 x Per Week/30 Days Discharge Instructions: Apply over primary dressing as directed. Secured With: 452M Medipore Public affairs consultant Surgical T 2x10 (in/yd) 1 x Per Week/30 Days ape Discharge Instructions: Secure with tape as directed. Com pression Wrap: Unnaboot w/Calamine, 4x10 (in/yd) 1 x Per Week/30 Days Discharge Instructions: Apply Unnaboot as directed. WOUND #3: - Ankle Wound Laterality: Right, Lateral Cleanser: Soap and Water 1 x Per Week/30 Days Discharge Instructions: May shower and wash wound with dial antibacterial soap and water prior to dressing change. Peri-Wound Care:  Sween Lotion (Moisturizing lotion) 1 x Per Week/30 Days Discharge Instructions: Apply moisturizing lotion as directed Topical: Keystone 1 x Per Week/30 Days Discharge Instructions: Apply as directed to wound Prim Dressing: Santyl Ointment 1 x Per Week/30 Days ary Discharge Instructions: Apply nickel thick amount to wound bed as instructed Secondary Dressing: T Non-adherent Dressing, 2x3 in 1 x Per Week/30 Days elfa Discharge Instructions: Apply over primary dressing as directed. Secondary Dressing: Woven Gauze Sponge, Non-Sterile 4x4 in 1 x Per Week/30 Days Discharge Instructions: Apply over primary dressing as directed. Secured With: 452M Medipore Public affairs consultant Surgical T 2x10 (in/yd) 1 x Per Week/30 Days ape Discharge Instructions: Secure with tape as directed. Com pression Wrap: Unnaboot w/Calamine, 4x10 (in/yd) 1 x Per Week/30 Days Discharge Instructions: Apply Unnaboot as directed. MAZZY, WHITEFOOT ([id number]) [id number] Page 10 of 12 12/19/2022: Both of her wounds look significantly better this week. There is slough buildup on both, but they are smaller. I used a curette to debride slough off of both of her wounds. We will continue the combination of Santyl and her Keystone topical antibiotic compound on both wounds. Continue bilateral Unna boots. Follow-up in 1 week. Electronic Signature(s) Signed: 12/19/2022 2:10:13 PM By: Fredirick Maudlin MD FACS Entered By: Fredirick Maudlin on 12/19/2022 14:10:13 -------------------------------------------------------------------------------- HxROS Details Patient Name: Date of Service: LO, PAAP 12/19/2022 1:30 PM Medical Record Number: [id number] Patient Account Number: [id number] Date of Birth/Sex: Treating RN: [birth year] (77 y.o. F) Primary Care Provider: [name] Other Clinician: Referring Provider: Treating Provider/Extender: [name] Session in Treatment: 20 Information  Obtained From Patient Eyes Medical History: Positive for: Cataracts - L eye Ear/Nose/Mouth/Throat Medical History: Negative for: Chronic sinus problems/congestion; Middle ear problems Hematologic/Lymphatic Medical History: Negative for: Anemia; Human Immunodeficiency Virus; Lymphedema Respiratory Medical History: Positive for: Asthma Past Medical History Notes: OSA on CPAP Cardiovascular Medical History: Positive for: Hypertension Past Medical History Notes: Cronic venous insufficiency 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 Gastrointestinal Medical History: Past Medical History Notes: liver cyst Endocrine Medical History: Negative for: Type I Diabetes; Type II Diabetes Musculoskeletal [name], [name] [id number] Page 11 of 12 Medical History: Past Medical History Notes: arthritis Neurologic Medical History: Positive for: Neuropathy - Feet and finger Negative for: Seizure Disorder Past Medical History Notes: restless leg syndrome Oncologic Medical History: Positive for: Received Chemotherapy - 2012; Received Radiation - 2012 HBO Extended History Items Eyes: Cataracts Immunizations Pneumococcal Vaccine: Received Pneumococcal Vaccination: Yes Received Pneumococcal Vaccination On or After 60th Birthday: Yes Implantable Devices No devices added Hospitalization / Surgery History Type of Hospitalization/Surgery Lower extremity venography 2020 Intravascular ultrasound peripheral vascular intervention melanoma 2011 Family and Social  History Cancer: Yes - Mother,Paternal Grandparents; Diabetes: Yes - Siblings; Heart Disease: Yes - Father; Hypertension: Yes - Father; Former smoker - quit in [employer]) Signed: 12/19/2022 3:59:07 PM By: Fredirick Maudlin MD FACS Entered By: Fredirick Maudlin on 12/19/2022  14:08:34 -------------------------------------------------------------------------------- SuperBill Details Patient Name: Date of Service: EDWARDINE, [name] 12/19/2022 Medical Record Number: [id number] Patient Account Number: [id number] Date of Birth/Sex: Treating RN: 13-Jun-1946 (77 y.o. F) Primary Care Provider: [name] Other Clinician: Referring Provider: Treating Provider/Extender: [name] Session in Treatment: 20 Diagnosis Coding ICD-10 Codes Code Description [id number] Non-pressure chronic ulcer of other part of left lower leg with fat layer exposed L97.818 Non-pressure chronic ulcer of other part of right lower leg with other specified severity I83.893 Varicose veins of bilateral lower extremities with other complications [id number] Venous insufficiency (chronic) (peripheral) G62.9 Polyneuropathy, unspecified R60.0 Localized edema MONTAYA, [name] [id number] Page 12 of 12 L03.115 Cellulitis of right lower limb Facility Procedures : CPT4 Code: [id number] Description: [id number] - DEBRIDE WOUND 1ST 20 SQ CM OR < ICD-10 Diagnosis Description L97.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d L97.818 Non-pressure chronic ulcer of other part of right lower leg with other  specified s Modifier: everity Quantity: 1 Physician Procedures : CPT4 Code Description Modifier [id number] - WC PHYS LEVEL 4 - EST PT 25 ICD-10 Diagnosis Description L97.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d L97.818 Non-pressure chronic ulcer of other part of right  lower leg with other specified severity I87.2 Venous insufficiency (chronic) (peripheral) G62.9 Polyneuropathy, unspecified Quantity: 1 : [id number] - WC PHYS DEBR WO ANESTH 20 SQ CM ICD-10 Diagnosis Description L97.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d  L97.818 Non-pressure chronic ulcer of other part of right lower leg with other  specified severity Quantity: 1 Electronic Signature(s) Signed: 12/19/2022 2:10:33 PM By: Fredirick Maudlin MD FACS Entered By: Fredirick Maudlin on 12/19/2022 14:10:33

## 2022-12-23 ENCOUNTER — Other Ambulatory Visit: Payer: Self-pay | Admitting: Family Medicine

## 2022-12-23 DIAGNOSIS — J4520 Mild intermittent asthma, uncomplicated: Secondary | ICD-10-CM

## 2022-12-26 ENCOUNTER — Encounter (HOSPITAL_BASED_OUTPATIENT_CLINIC_OR_DEPARTMENT_OTHER): Payer: Medicare Other | Admitting: General Surgery

## 2022-12-26 DIAGNOSIS — G629 Polyneuropathy, unspecified: Secondary | ICD-10-CM | POA: Diagnosis not present

## 2022-12-26 DIAGNOSIS — I872 Venous insufficiency (chronic) (peripheral): Secondary | ICD-10-CM | POA: Diagnosis not present

## 2022-12-26 DIAGNOSIS — Q969 Turner's syndrome, unspecified: Secondary | ICD-10-CM | POA: Diagnosis not present

## 2022-12-26 DIAGNOSIS — G4733 Obstructive sleep apnea (adult) (pediatric): Secondary | ICD-10-CM | POA: Diagnosis not present

## 2022-12-26 DIAGNOSIS — L97818 Non-pressure chronic ulcer of other part of right lower leg with other specified severity: Secondary | ICD-10-CM | POA: Diagnosis not present

## 2022-12-26 DIAGNOSIS — L97222 Non-pressure chronic ulcer of left calf with fat layer exposed: Secondary | ICD-10-CM | POA: Diagnosis not present

## 2022-12-26 DIAGNOSIS — I10 Essential (primary) hypertension: Secondary | ICD-10-CM | POA: Diagnosis not present

## 2022-12-26 DIAGNOSIS — L97312 Non-pressure chronic ulcer of right ankle with fat layer exposed: Secondary | ICD-10-CM | POA: Diagnosis not present

## 2022-12-26 DIAGNOSIS — L97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d: Secondary | ICD-10-CM | POA: Diagnosis not present

## 2022-12-27 NOTE — Progress Notes (Signed)
MIRABELLA, MUENZER ([id number]) [id number] Page 1 of 10 Visit Report for 12/26/2022 Arrival Information Details Patient Name: Date of Service: [name], [name] 12/26/2022 3:45 PM Medical Record Number: [id number] Patient Account Number: [id number] Date of Birth/Sex: Treating RN: 1946/03/15 (77 y.o. [name], [name] Primary Care [name]: [name] Other Clinician: Referring [name]: Treating [name]/Extender: [name] Session in Treatment: 21 Visit Information History Since Last Visit Added or deleted any medications: No Patient Arrived: Ambulatory Any new allergies or adverse reactions: No Arrival Time: 15:43 Had a fall or experienced change in No Accompanied By: SELF activities of daily living that may affect Transfer Assistance: None risk of falls: Patient Identification Verified: Yes Signs or symptoms of abuse/neglect since last visito No Secondary Verification Process Completed: Yes Hospitalized since last visit: No Patient Requires Transmission-Based Precautions: No Implantable device outside of the clinic excluding No Patient Has Alerts: No cellular tissue based products placed in the center since last visit: Has Compression in Place as Prescribed: Yes Pain Present Now: Yes Electronic Signature(s) Signed: 12/26/2022 4:23:23 PM By: Amber East RN Entered By: [name] on 12/26/2022 15:43:28 -------------------------------------------------------------------------------- Encounter Discharge Information Details Patient Name: Date of Service: [name] 12/26/2022 3:45 PM Medical Record Number: [id number] Patient Account Number: [id number] Date of Birth/Sex: Treating RN: 1945-12-05 (77 y.o. [name] Primary Care [name]: [name] Other Clinician: Referring Deaun Rocha: Treating [name]/Extender: [name] Session in Treatment: 21 Encounter Discharge Information Items  Post Procedure Vitals Discharge Condition: Stable Temperature (F): 97.7 Ambulatory Status: Ambulatory Pulse (bpm): 74 Discharge Destination: Home Respiratory Rate (breaths/min): 18 Transportation: Private Auto Blood Pressure (mmHg): 159/83 Accompanied By: self Schedule Follow-up Appointment: Yes Clinical Summary of Care: Electronic Signature(s) Signed: 12/26/2022 4:13:12 PM By: Amber East RN Entered By: [name] on 12/26/2022 [name], [name] [id number] Page 2 of 10 -------------------------------------------------------------------------------- Lower Extremity Assessment Details Patient Name: Date of Service: [name], [name] 12/26/2022 3:45 PM Medical Record Number: [id number] Patient Account Number: [id number] Date of Birth/Sex: Treating RN: 01-02-1946 (77 y.o. [name] Primary Care Bambi Fehnel: [name] Other Clinician: Referring Azilee Pirro: Treating [name]/Extender: [name] Weeks in Treatment: 21 Edema Assessment Assessed: [Left: No] [Right: No] Edema: [Left: Ye] [Right: s] Calf Left: Right: Point of Measurement: From Medial Instep 39 cm 34 cm Ankle Left: Right: Point of Measurement: From Medial Instep 24 cm 22 cm Vascular Assessment Pulses: Dorsalis Pedis Palpable: [Left:Yes] [Right:Yes] Electronic Signature(s) Signed: 12/26/2022 4:23:23 PM By: Amber East RN Entered By: [name] on 12/26/2022 15:44:16 -------------------------------------------------------------------------------- Multi Wound Chart Details Patient Name: Date of Service: [name] 12/26/2022 3:45 PM Medical Record Number: [id number] Patient Account Number: [id number] Date of Birth/Sex: Treating RN: 02/06/1946 (77 y.o. F) Primary Care [name]: [name] Other Clinician: Referring [name]: Treating [name]/Extender: [name] Session in Treatment: 21 Vital  Signs Height(in): 63 Pulse(bpm): 74 Weight(lbs): 200 Blood Pressure(mmHg): 159/83 Body Mass Index(BMI): 35.4 Temperature(F): 97.7 Respiratory Rate(breaths/min): 18 [2:Photos:] [N/A:N/A [id number] Page 3 of 10] Left, Posterior Lower Leg Right, Lateral Ankle N/A Wound Location: Blister Contusion/Bruise N/A Wounding Event: Venous Leg Ulcer Venous Leg Ulcer N/A Primary Etiology: Cataracts, Asthma, Hypertension, Cataracts, Asthma, Hypertension, N/A Comorbid History: Neuropathy, Received Chemotherapy, Neuropathy, Received Chemotherapy, Received Radiation Received Radiation 07/06/2022 10/19/2022 N/A Date Acquired: 21 9 N/A Weeks of Treatment: Open Open N/A Wound Status: No No N/A Wound Recurrence: 4.8x1.9x0.1 0.5x0.6x0.1 N/A Measurements L x W x D (cm) 7.163 0.236 N/A A (cm) : rea 0.716 0.024 N/A Volume (  cm) : -4.20% 91.20% N/A % Reduction in A rea: -4.20% 91.00% N/A % Reduction in Volume: Full Thickness Without Exposed Full Thickness Without Exposed N/A Classification: Support Structures Support Structures Medium Medium N/A Exudate A mount: Serosanguineous Serosanguineous N/A Exudate Type: red, brown red, brown N/A Exudate Color: Flat and Intact Flat and Intact N/A Wound Margin: Small (1-33%) Medium (34-66%) N/A Granulation A mount: Red Pink N/A Granulation Quality: Large (67-100%) Medium (34-66%) N/A Necrotic A mount: Eschar, Adherent Slough Adherent Slough N/A Necrotic Tissue: Fat Layer (Subcutaneous Tissue): Yes Fat Layer (Subcutaneous Tissue): Yes N/A Exposed Structures: Fascia: No Fascia: No Tendon: No Tendon: No Muscle: No Muscle: No Joint: No Joint: No Bone: No Bone: No Small (1-33%) Small (1-33%) N/A Epithelialization: Debridement - Selective/Open Wound Debridement - Selective/Open Wound N/A Debridement: Pre-procedure Verification/Time Out 15:57 15:57 N/A Taken: Lidocaine 4% Topical Solution Lidocaine 4% Topical  Solution N/A Pain Control: [employer] N/A Tissue Debrided: Non-Viable Tissue Non-Viable Tissue N/A Level: 9.12 0.3 N/A Debridement A (sq cm): rea Curette Curette N/A Instrument: Minimum Minimum N/A Bleeding: Pressure Pressure N/A Hemostasis A chieved: Procedure was tolerated well Procedure was tolerated well N/A Debridement Treatment Response: 4.8x1.9x0.1 0.5x0.6x0.1 N/A Post Debridement Measurements L x W x D (cm) 0.716 0.024 N/A Post Debridement Volume: (cm) Excoriation: No Excoriation: Yes N/A Periwound Skin Texture: Maceration: No Maceration: Yes N/A Periwound Skin Moisture: No Abnormalities Noted Erythema: No N/A Periwound Skin Color: No Abnormality No Abnormality N/A Temperature: Yes Yes N/A Tenderness on Palpation: Debridement Debridement N/A Procedures Performed: Treatment Notes Wound #2 (Lower Leg) Wound Laterality: Left, Posterior Cleanser Soap and Water Discharge Instruction: May shower and wash wound with dial antibacterial soap and water prior to dressing change. Peri-Wound Care Sween Lotion (Moisturizing lotion) Discharge Instruction: Apply moisturizing lotion as directed Topical Keystone Discharge Instruction: Apply as directed to wound Primary Dressing Santyl Ointment Discharge Instruction: Apply nickel thick amount to wound bed as instructed Secondary Dressing T Non-adherent Dressing, 2x3 in elfa Discharge Instruction: Apply over primary dressing as directed. Zetuvit Plus 4x8 in Discharge Instruction: Apply over primary dressing as directed. Secured With QADRIYYAH, BERGSMA ([id number]) [id number] Page 4 of 10 29M Medipore Soft Cloth Surgical T 2x10 (in/yd) ape Discharge Instruction: Secure with tape as directed. Compression Wrap Unnaboot w/Calamine, 4x10 (in/yd) Discharge Instruction: Apply Unnaboot as directed. Compression Stockings Add-Ons Wound #3 (Ankle) Wound Laterality: Right, Lateral Cleanser Soap  and Water Discharge Instruction: May shower and wash wound with dial antibacterial soap and water prior to dressing change. Peri-Wound Care Sween Lotion (Moisturizing lotion) Discharge Instruction: Apply moisturizing lotion as directed Topical Keystone Discharge Instruction: Apply as directed to wound Primary Dressing Santyl Ointment Discharge Instruction: Apply nickel thick amount to wound bed as instructed Secondary Dressing T Non-adherent Dressing, 2x3 in elfa Discharge Instruction: Apply over primary dressing as directed. Woven Gauze Sponge, Non-Sterile 4x4 in Discharge Instruction: Apply over primary dressing as directed. Secured With [employer] Surgical T 2x10 (in/yd) ape Discharge Instruction: Secure with tape as directed. Compression Wrap Unnaboot w/Calamine, 4x10 (in/yd) Discharge Instruction: Apply Unnaboot as directed. Compression Stockings Add-Ons Electronic Signature(s) Signed: 12/26/2022 4:14:33 PM By: Fredirick Maudlin MD FACS Entered By: Fredirick Maudlin on 12/26/2022 16:14:33 -------------------------------------------------------------------------------- Multi-Disciplinary Care Plan Details Patient Name: Date of Service: TRECIA, SOFIELD 12/26/2022 3:45 PM Medical Record Number: [id number] Patient Account Number: [id number] Date of Birth/Sex: Treating RN: 1945/12/04 (77 y.o. [name] Primary Care Aronda Burford: [name] Other Clinician: Referring Kachina Niederer: Treating Malvina Schadler/Extender: [name] Weeks in Treatment: 21  Active Inactive Wound/Skin Impairment [name], [name] [id number] Page 5 of 10 Nursing Diagnoses: Impaired tissue integrity Goals: Ulcer/skin breakdown will have a volume reduction of 30% by week 4 Date Initiated: 08/03/2022 Date Inactivated: 09/10/2022 Target Resolution Date: 08/31/2022 Unmet Reason: uncontrolled edema, Goal Status: Unmet acute  infection Ulcer/skin breakdown will have a volume reduction of 50% by week 8 Date Initiated: 09/10/2022 Target Resolution Date: 04/05/2023 Goal Status: Active Interventions: Assess ulceration(s) every visit Treatment Activities: Skin care regimen initiated : 08/03/2022 Notes: Keystone ordered. 10/30 RX Levo started. 09/10/22 Electronic Signature(s) Signed: 12/26/2022 4:23:23 PM By: Amber East RN Entered By: [name] on 12/26/2022 15:57:54 -------------------------------------------------------------------------------- Pain Assessment Details Patient Name: Date of Service: [name], [name] 12/26/2022 3:45 PM Medical Record Number: [id number] Patient Account Number: [id number] Date of Birth/Sex: Treating RN: Aug 14, 1946 (77 y.o. [name] Primary Care Nikeshia Keetch: [name] Other Clinician: Referring Special Ranes: Treating Kashmere Daywalt/Extender: [name] Session in Treatment: 21 Active Problems Location of Pain Severity and Description of Pain Patient Has Paino No Site Locations Rate the pain. Current Pain Level: 2 Character of Pain Describe the Pain: Tender Pain Management and Medication Current Pain Management: Electronic Signature(s) Signed: 12/26/2022 4:23:23 PM By: Amber East RN Ferdinand Lango, [name] ([id number]) [id number] Page 6 of 10 Signed: 12/26/2022 4:23:23 PM By: Amber East RN Entered By: [name] on 12/26/2022 15:44:00 -------------------------------------------------------------------------------- Patient/Caregiver Education Details Patient Name: Date of Service: [name], [name] 2/21/2024andnbsp3:45 PM Medical Record Number: [id number] Patient Account Number: [id number] Date of Birth/Gender: Treating RN: Dec 12, 1945 (77 y.o. [name] Primary Care Physician: [name] Other Clinician: Referring Physician: Treating Physician/Extender: [name] Session in Treatment:  21 Education Assessment Education Provided To: Patient Education Topics Provided Wound Debridement: Methods: Explain/Verbal Responses: Reinforcements needed, State content correctly Wound/Skin Impairment: Methods: Explain/Verbal Responses: Reinforcements needed, State content correctly Electronic Signature(s) Signed: 12/26/2022 4:23:23 PM By: Amber East RN Entered By: [name] on 12/26/2022 16:00:18 -------------------------------------------------------------------------------- Wound Assessment Details Patient Name: Date of Service: [name], [name] 12/26/2022 3:45 PM Medical Record Number: [id number] Patient Account Number: [id number] Date of Birth/Sex: Treating RN: December 02, 1945 (77 y.o. [name] Primary Care Julliette Frentz: [name] Other Clinician: Referring Ewald Beg: Treating Javius Sylla/Extender: [name] Weeks in Treatment: 21 Wound Status Wound Number: 2 Primary Venous Leg Ulcer Etiology: Wound Location: Left, Posterior Lower Leg Wound Open Wounding Event: Blister Status: Date Acquired: 07/06/2022 Comorbid Cataracts, Asthma, Hypertension, Neuropathy, Received Weeks Of Treatment: 21 History: Chemotherapy, Received Radiation Clustered Wound: No Photos NIESHIA, GARROD ([id number]) [id number] Page 7 of 10 Wound Measurements Length: (cm) 4.8 Width: (cm) 1.9 Depth: (cm) 0.1 Area: (cm) 7.163 Volume: (cm) 0.716 % Reduction in Area: -4.2% % Reduction in Volume: -4.2% Epithelialization: Small (1-33%) Tunneling: No Undermining: No Wound Description Classification: Full Thickness Without Exposed Support Structures Wound Margin: Flat and Intact Exudate Amount: Medium Exudate Type: Serosanguineous Exudate Color: red, brown Foul Odor After Cleansing: No Slough/Fibrino Yes Wound Bed Granulation Amount: Small (1-33%) Exposed Structure Granulation Quality: Red Fascia Exposed: No Necrotic Amount: Large  (67-100%) Fat Layer (Subcutaneous Tissue) Exposed: Yes Necrotic Quality: Eschar, Adherent Slough Tendon Exposed: No Muscle Exposed: No Joint Exposed: No Bone Exposed: No Periwound Skin Texture Texture Color No Abnormalities Noted: Yes No Abnormalities Noted: Yes Moisture Temperature / Pain No Abnormalities Noted: Yes Temperature: No Abnormality Tenderness on Palpation: Yes Treatment Notes Wound #2 (Lower Leg) Wound Laterality: Left, Posterior Cleanser Soap and Water Discharge Instruction: May shower and wash wound with dial antibacterial soap and  water prior to dressing change. Peri-Wound Care Sween Lotion (Moisturizing lotion) Discharge Instruction: Apply moisturizing lotion as directed Topical Keystone Discharge Instruction: Apply as directed to wound Primary Dressing Santyl Ointment Discharge Instruction: Apply nickel thick amount to wound bed as instructed Secondary Dressing T Non-adherent Dressing, 2x3 in elfa Discharge Instruction: Apply over primary dressing as directed. Zetuvit Plus 4x8 in Discharge Instruction: Apply over primary dressing as directed. Secured With [employer] Surgical T 2x10 (in/yd) ape Discharge Instruction: Secure with tape as directed. [name], [name] [id number] Page 8 of 10 Compression Wrap Unnaboot w/Calamine, 4x10 (in/yd) Discharge Instruction: Apply Unnaboot as directed. Compression Stockings Add-Ons Electronic Signature(s) Signed: 12/26/2022 4:23:23 PM By: Amber East RN Entered By: [name] on 12/26/2022 15:50:57 -------------------------------------------------------------------------------- Wound Assessment Details Patient Name: Date of Service: [name], [name] 12/26/2022 3:45 PM Medical Record Number: [id number] Patient Account Number: [id number] Date of Birth/Sex: Treating RN: [birth year] (77 y.o. [name], [name] Primary Care Zamariah Seaborn: [name] Other  Clinician: Referring Lucciana Head: Treating Taksh Hjort/Extender: [name] Weeks in Treatment: 21 Wound Status Wound Number: 3 Primary Venous Leg Ulcer Etiology: Wound Location: Right, Lateral Ankle Wound Open Wounding Event: Contusion/Bruise Status: Date Acquired: 10/19/2022 Comorbid Cataracts, Asthma, Hypertension, Neuropathy, Received Weeks Of Treatment: 9 History: Chemotherapy, Received Radiation Clustered Wound: No Photos Wound Measurements Length: (cm) 0.5 Width: (cm) 0.6 Depth: (cm) 0.1 Area: (cm) 0.236 Volume: (cm) 0.024 % Reduction in Area: 91.2% % Reduction in Volume: 91% Epithelialization: Small (1-33%) Tunneling: No Undermining: No Wound Description Classification: Full Thickness Without Exposed Suppor Wound Margin: Flat and Intact Exudate Amount: Medium Exudate Type: Serosanguineous Exudate Color: red, brown t Structures Foul Odor After Cleansing: No Slough/Fibrino Yes Wound Bed Granulation Amount: Medium (34-66%) Exposed Structure Granulation Quality: Pink Fascia Exposed: No Necrotic Amount: Medium (34-66%) Fat Layer (Subcutaneous Tissue) Exposed: Yes Necrotic Quality: Adherent Slough Tendon Exposed: No Muscle Exposed: No Joint Exposed: No Bone Exposed: No [name], [name] [id number] Page 9 of 10 Periwound Skin Texture Texture Color No Abnormalities Noted: Yes No Abnormalities Noted: No Erythema: No Moisture No Abnormalities Noted: Yes Temperature / Pain Temperature: No Abnormality Tenderness on Palpation: Yes Treatment Notes Wound #3 (Ankle) Wound Laterality: Right, Lateral Cleanser Soap and Water Discharge Instruction: May shower and wash wound with dial antibacterial soap and water prior to dressing change. Peri-Wound Care Sween Lotion (Moisturizing lotion) Discharge Instruction: Apply moisturizing lotion as directed Topical Keystone Discharge Instruction: Apply as directed  to wound Primary Dressing Santyl Ointment Discharge Instruction: Apply nickel thick amount to wound bed as instructed Secondary Dressing T Non-adherent Dressing, 2x3 in elfa Discharge Instruction: Apply over primary dressing as directed. Woven Gauze Sponge, Non-Sterile 4x4 in Discharge Instruction: Apply over primary dressing as directed. Secured With [employer] Surgical T 2x10 (in/yd) ape Discharge Instruction: Secure with tape as directed. Compression Wrap Unnaboot w/Calamine, 4x10 (in/yd) Discharge Instruction: Apply Unnaboot as directed. Compression Stockings Add-Ons Electronic Signature(s) Signed: 12/26/2022 4:23:23 PM By: Amber East RN Entered By: [name] on 12/26/2022 15:48:47 -------------------------------------------------------------------------------- Vitals Details Patient Name: Date of Service: [name], [name] 12/26/2022 3:45 PM Medical Record Number: [id number] Patient Account Number: [id number] Date of Birth/Sex: Treating RN: 1946-10-18 (77 y.o. [name], [name] Primary Care Damel Querry: [name] Other Clinician: Referring Caio Devera: Treating Scorpio Fortin/Extender: [name] Session in Treatment: 21 Vital Signs Time Taken: 15:40 Temperature (F): 97.7 Height (in): 63 Pulse (bpm): 74 Weight (lbs): 200 Respiratory Rate (breaths/min): 18 Body Mass Index (BMI): 35.4 Blood Pressure (mmHg): 159/83  Reference Range: 80 - 120 mg / dl [name], [name] [id number] Page 10 of 10 Electronic Signature(s) Signed: 12/26/2022 4:23:23 PM By: Amber East RN Entered By: [name] on 12/26/2022 15:43:47

## 2022-12-27 NOTE — Progress Notes (Signed)
[name], [name] [id number] Page 1 of 12 Visit Report for 12/26/2022 Chief Complaint Document Details Patient Name: Date of Service: [name], [name] 12/26/2022 3:45 PM Medical Record Number: [id number] Patient Account Number: [id number] Date of Birth/Sex: Treating RN: 11-Nov-1945 (77 y.o. F) Primary Care Provider: [name] Other Clinician: Referring Provider: Treating Provider/Extender: [name] Session in Treatment: 21 Information Obtained from: Patient Chief Complaint Patient presents for treatment of an open ulcer due to venous insufficiency Electronic Signature(s) Signed: 12/26/2022 4:14:40 PM By: Fredirick Maudlin MD FACS Entered By: Fredirick Maudlin on 12/26/2022 16:14:40 -------------------------------------------------------------------------------- Debridement Details Patient Name: Date of Service: [name], [name] 12/26/2022 3:45 PM Medical Record Number: [id number] Patient Account Number: [id number] Date of Birth/Sex: Treating RN: January 19, 1946 (77 y.o. [name], [name] Primary Care Provider: [name] Other Clinician: Referring Provider: Treating Provider/Extender: [name] Session in Treatment: 21 Debridement Performed for Assessment: Wound #2 Left,Posterior Lower Leg Performed By: Physician Fredirick Maudlin, MD Debridement Type: Debridement Severity of Tissue Pre Debridement: Fat layer exposed Level of Consciousness (Pre-procedure): Awake and Alert Pre-procedure Verification/Time Out Yes - 15:57 Taken: Start Time: 15:58 Pain Control: Lidocaine 4% T opical Solution T Area Debrided (L x W): otal 4.8 (cm) x 1.9 (cm) = 9.12 (cm) Tissue and other material debrided: Non-Viable, Slough, Slough Level: Non-Viable Tissue Debridement Description: Selective/Open Wound Instrument: Curette Bleeding: Minimum Hemostasis Achieved: Pressure Response to Treatment: Procedure was  tolerated well Level of Consciousness (Post- Awake and Alert procedure): Post Debridement Measurements of Total Wound Length: (cm) 4.8 Width: (cm) 1.9 Depth: (cm) 0.1 Volume: (cm) 0.716 Character of Wound/Ulcer Post Debridement: Requires Further Debridement Severity of Tissue Post Debridement: Fat layer exposed Post Procedure Diagnosis Same as Pre-procedure [name], [name] [id number] Page 2 of 12 Notes Scribed for Dr. Celine Ahr by Blanche East, RN Electronic Signature(s) Signed: 12/26/2022 4:23:23 PM By: Blanche East RN Signed: 12/26/2022 4:24:31 PM By: Fredirick Maudlin MD FACS Entered By: Blanche East on 12/26/2022 15:59:59 -------------------------------------------------------------------------------- Debridement Details Patient Name: Date of Service: [name], [name] 12/26/2022 3:45 PM Medical Record Number: [id number] Patient Account Number: [id number] Date of Birth/Sex: Treating RN: April 20, 1946 (77 y.o. [name], [name] Primary Care Provider: [name] Other Clinician: Referring Provider: Treating Provider/Extender: [name] Session in Treatment: 21 Debridement Performed for Assessment: Wound #3 Right,Lateral Ankle Performed By: Physician Fredirick Maudlin, MD Debridement Type: Debridement Severity of Tissue Pre Debridement: Fat layer exposed Level of Consciousness (Pre-procedure): Awake and Alert Pre-procedure Verification/Time Out Yes - 15:57 Taken: Start Time: 15:58 Pain Control: Lidocaine 4% T opical Solution T Area Debrided (L x W): otal 0.5 (cm) x 0.6 (cm) = 0.3 (cm) Tissue and other material debrided: Non-Viable, Slough, Slough Level: Non-Viable Tissue Debridement Description: Selective/Open Wound Instrument: Curette Bleeding: Minimum Hemostasis Achieved: Pressure Response to Treatment: Procedure was tolerated well Level of Consciousness (Post- Awake and Alert procedure): Post Debridement  Measurements of Total Wound Length: (cm) 0.5 Width: (cm) 0.6 Depth: (cm) 0.1 Volume: (cm) 0.024 Character of Wound/Ulcer Post Debridement: Requires Further Debridement Severity of Tissue Post Debridement: Fat layer exposed Post Procedure Diagnosis Same as Pre-procedure Notes Scribed for Dr. Celine Ahr by Blanche East, RN Electronic Signature(s) Signed: 12/26/2022 4:23:23 PM By: Blanche East RN Signed: 12/26/2022 4:24:31 PM By: Fredirick Maudlin MD FACS Entered By: Blanche East on 12/26/2022 16:07:52 HPI Details -------------------------------------------------------------------------------- [name] [id number] Page 3 of 12 Patient Name: Date of Service: [name], [name] 12/26/2022 3:45 PM Medical Record Number: [id number] Patient Account Number: [id number] Date  of Birth/Sex: Treating RN: 03-03-1946 (77 y.o. F) Primary Care Provider: [name] Other Clinician: Referring Provider: Treating Provider/Extender: [name] Session in Treatment: 21 History of Present Illness HPI Description: ADMISSION This is a 77 year old woman with a history of chronic venous insufficiency status post saphenous vein ablations in 2010 and 2016. 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 She presents today with an open venous ulcer on her left lower leg. It has been present for a little over 2 weeks. She saw her primary care provider who apparently swabbed the wound and grew out Pseudomonas. She just completed a course of ciprofloxacin for this. ABI was 0.91. She reports that she has had previous issues with ulcers in this same location, stemming back to a punch biopsy taken by a dermatologist many years ago. She has had several skin substitutes applied to the area that have ultimately resulted in healing on prior occasions. She has 2 small ulcers on her left medial lower leg. There is slough accumulation in  both of them. The more anterior of the 2 is quite small and has some soft slough on the surface, underneath which there is good granulation tissue. The more medial wound also has slough accumulation, but the underlying surface is fairly fibrotic and gritty. This is consistent with her provided history of multiple ulcers in the same location. 08/03/2022: The anterior wound is smaller today with just a little bit of slough accumulation. The more medial wound continues to be very sensitive and fibrotic with slough buildup. 08/13/2022: The anterior wound is closed. The more medial wound remains sensitive with a fairly fibrotic surface. There is more granulation tissue filling in, however, and there is less slough than on prior occasions. 08/20/2022: The anterior wound remains closed. The more medial wound has filled with granulation tissue but still has a fair amount of slough on the surface and remains fairly tender. Unfortunately, she has developed 2 small ulcers just proximal to this. The fat layer is exposed in each. She says that over the weekend, she felt a burning sensation in that location. 08/27/2022: The 2 small wounds proximal to the main wound have merged into a single site. The wounds look a little bit dry, but they are quite clean without significant slough accumulation. They remain quite tender. 09/03/2022: All of the wounds have now merged into 1 large wound. There is a strong odor coming from the wound and the surface is not particularly viable. It is extremely painful for her today. 09/10/2022: The wound is less black and purple this week but still does not look particularly viable. The surface is desiccated. The culture that I took last week returned with a polymicrobial population including Pseudomonas. I prescribed both Augmentin and levofloxacin. She has not yet picked up levofloxacin, as her pharmacy only notified her yesterday that it was ready. We have ordered a Keystone topical  compounded antibiotic, but it has not yet come. 09/17/2022: The wound surface is markedly improved today. There is still an area of grayish-looking muscle but the rest appears significantly more viable. There is still a layer of slough on the surface. She still has a couple days left of oral antibiotic therapy. She has her [employer] compounded topical antibiotic with her today. 09/24/2022: She has completed her oral antibiotic therapy. The wound surface is much cleaner today and more viable without any necrotic tissue. It is a bit desiccated, however. 10/01/2022: The moisture balance of the wound has improved. There is  a layer of yellow slough on the surface, but beneath this, the wound is more pink. 10/08/2022: The wound is smaller and cleaner today with a layer of slough present. She is having less pain. 12/18; this patient has a new wound on the right lateral ankle which apparently started after a cat scratched her. Secondarily she managed to drop a cake pan on the same area. This is very painful. The original wound was on the left posterior calf we have been using [employer] under an [employer]. The Unna boot fell down and apparently she was in for a nurse visit perhaps last week and that 1 fell down as well This patient has central venous mediated hypertension. For member correctly she has a stent placed in the left common iliac artery by Dr. Donzetta Matters some years ago [facility] Thurner syndrome] she also has had venous ablations and I believe a history of a DVT The patient has compression pumps at home but she does not use them 10/30/2022: The patient says that her wounds burned all week with the [facility] classic in place. She has a fair amount of slough and nonviable subcutaneous tissue present on the right ankle. The left posterior calf wound is cleaner than I have seen it to date. Both remain fairly tender. 11/14/2022: Both wounds have substantial slough and eschar accumulation. They remain  extremely tender. 11/21/2022: The wounds are cleaner this week, but still have slough and eschar accumulation. She continues to describe burning pain in both wounds, but upon further questioning, she actually has burning in both of her feet that radiates up her legs and includes to the wounds. 11/28/2022: Both wounds have slough and eschar accumulation, as well as adherent silver alginate that was difficult to remove. She continues to have pain out of proportion to the extent of her wounds. Her PCP did initiate Lyrica which she started taking last night. The dose is quite low, only 25 mg, but apparently there is a plan for upward titration, assuming she tolerates the medication. 12/05/2022: The wounds are both a little bit smaller today, but have significant slough accumulation, as usual. They remain exquisitely tender. She is now taking Lyrica twice a day. 12/19/2022: Both of her wounds look significantly better this week. There is slough buildup on both, but they are smaller. She seems to be getting a good response from Lyrica as she is having much less pain. 12/26/2022: The wound on her right lateral ankle is nearly closed. The wound on her left posterior calf is smaller but still has a lot of slough accumulation. They remain tender. Electronic Signature(s) Signed: 12/26/2022 4:15:25 PM By: Fredirick Maudlin MD FACS Entered By: Fredirick Maudlin on 12/26/2022 16:15:25 [name] [id number] Page 4 of 12 -------------------------------------------------------------------------------- Physical Exam Details Patient Name: Date of Service: MIKILA, MAINES 12/26/2022 3:45 PM Medical Record Number: [id number] Patient Account Number: [id number] Date of Birth/Sex: Treating RN: 01-13-1946 (77 y.o. F) Primary Care Provider: [name] Other Clinician: Referring Provider: Treating Provider/Extender: [name] Session in Treatment:  21 Constitutional Hypertensive, asymptomatic. . . . no acute distress. Respiratory Normal work of breathing on room air. Notes 12/26/2022: The wound on her right lateral ankle is nearly closed. The wound on her left posterior calf is smaller but still has a lot of slough accumulation. They remain tender. Electronic Signature(s) Signed: 12/26/2022 4:16:24 PM By: Fredirick Maudlin MD FACS Entered By: Fredirick Maudlin on 12/26/2022 16:16:24 -------------------------------------------------------------------------------- Physician Orders Details Patient Name: Date of Service:  [name], [name] 12/26/2022 3:45 PM Medical Record Number: [id number] Patient Account Number: [id number] Date of Birth/Sex: Treating RN: 1946-10-03 (77 y.o. [name], [name] Primary Care Provider: [name] Other Clinician: Referring Provider: Treating Provider/Extender: [name] Session in Treatment: 21 Verbal / Phone Orders: No Diagnosis Coding ICD-10 Coding Code Description [phone] Non-pressure chronic ulcer of other part of left lower leg with fat layer exposed L97.818 Non-pressure chronic ulcer of other part of right lower leg with other specified severity I83.893 Varicose veins of bilateral lower extremities with other complications [id number] Venous insufficiency (chronic) (peripheral) G62.9 Polyneuropathy, unspecified R60.0 Localized edema L03.115 Cellulitis of right lower limb Follow-up Appointments ppointment in 1 week. - Dr. Celine Ahr RM 3 Return A Anesthetic Wound #2 Left,Posterior Lower Leg (In clinic) Topical Lidocaine 5% applied to wound bed - in clinic, prior to debridement (In clinic) Topical Lidocaine 4% applied to wound bed Bathing/ Shower/ Hygiene May shower with protection but do not get wound dressing(s) wet. Protect dressing(s) with water repellant cover (for example, large plastic bag) or a cast cover and may then take shower. RYLANN, TOLLY ([id number])  [id number] Page 5 of 12 Edema Control - Lymphedema / SCD / Other Left Lower Extremity Lymphedema Pumps. Use Lymphedema pumps on leg(s) 2-3 times a day for 45-60 minutes. If wearing any wraps or hose, do not remove them. Continue exercising as instructed. - Use x1 per day Avoid standing for long periods of time. Exercise regularly Wound Treatment Wound #2 - Lower Leg Wound Laterality: Left, Posterior Cleanser: Soap and Water 1 x Per Week/30 Days Discharge Instructions: May shower and wash wound with dial antibacterial soap and water prior to dressing change. Peri-Wound Care: Sween Lotion (Moisturizing lotion) 1 x Per Week/30 Days Discharge Instructions: Apply moisturizing lotion as directed Topical: Keystone 1 x Per Week/30 Days Discharge Instructions: Apply as directed to wound Prim Dressing: Santyl Ointment 1 x Per Week/30 Days ary Discharge Instructions: Apply nickel thick amount to wound bed as instructed Secondary Dressing: T Non-adherent Dressing, 2x3 in 1 x Per Week/30 Days elfa Discharge Instructions: Apply over primary dressing as directed. Secondary Dressing: Zetuvit Plus 4x8 in 1 x Per Week/30 Days Discharge Instructions: Apply over primary dressing as directed. Secured With: 57M Medipore Public affairs consultant Surgical T 2x10 (in/yd) 1 x Per Week/30 Days ape Discharge Instructions: Secure with tape as directed. Compression Wrap: Unnaboot w/Calamine, 4x10 (in/yd) 1 x Per Week/30 Days Discharge Instructions: Apply Unnaboot as directed. Wound #3 - Ankle Wound Laterality: Right, Lateral Cleanser: Soap and Water 1 x Per Week/30 Days Discharge Instructions: May shower and wash wound with dial antibacterial soap and water prior to dressing change. Peri-Wound Care: Sween Lotion (Moisturizing lotion) 1 x Per Week/30 Days Discharge Instructions: Apply moisturizing lotion as directed Topical: Keystone 1 x Per Week/30 Days Discharge Instructions: Apply as directed to  wound Prim Dressing: Santyl Ointment 1 x Per Week/30 Days ary Discharge Instructions: Apply nickel thick amount to wound bed as instructed Secondary Dressing: T Non-adherent Dressing, 2x3 in 1 x Per Week/30 Days elfa Discharge Instructions: Apply over primary dressing as directed. Secondary Dressing: Woven Gauze Sponge, Non-Sterile 4x4 in 1 x Per Week/30 Days Discharge Instructions: Apply over primary dressing as directed. Secured With: 57M Medipore Public affairs consultant Surgical T 2x10 (in/yd) 1 x Per Week/30 Days ape Discharge Instructions: Secure with tape as directed. Compression Wrap: Unnaboot w/Calamine, 4x10 (in/yd) 1 x Per Week/30 Days Discharge Instructions: Apply Unnaboot as directed. Electronic Signature(s) Signed: 12/26/2022 4:24:31 PM By: Celine Ahr,  Anderson Malta MD FACS Entered By: Fredirick Maudlin on 12/26/2022 16:22:46 -------------------------------------------------------------------------------- Problem List Details Patient Name: Date of Service: [name], [name] 12/26/2022 3:45 PM Medical Record Number: [id number] Patient Account Number: [id number] [name], [name] [id number] Page 6 of 12 Date of Birth/Sex: Treating RN: [birth year] (77 y.o. F) Primary Care Provider: Other Clinician: [name] Referring Provider: Treating Provider/Extender: [name] Session in Treatment: 21 Active Problems ICD-10 Encounter Code Description Active Date MDM Diagnosis L97.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d9/22/2023 No Yes L97.818 Non-pressure chronic ulcer of other part of right lower leg with other specified 10/22/2022 No Yes severity I83.893 Varicose veins of bilateral lower extremities with other complications 0000000 No Yes I87.2 Venous insufficiency (chronic) (peripheral) 07/27/2022 No Yes G62.9 Polyneuropathy, unspecified 07/27/2022 No Yes R60.0 Localized edema 07/27/2022 No  Yes L03.115 Cellulitis of right lower limb 10/22/2022 No Yes Inactive Problems Resolved Problems Electronic Signature(s) Signed: 12/26/2022 4:14:20 PM By: Fredirick Maudlin MD FACS Entered By: Fredirick Maudlin on 12/26/2022 16:14:20 -------------------------------------------------------------------------------- Progress Note Details Patient Name: Date of Service: [name] 12/26/2022 3:45 PM Medical Record Number: [id number] Patient Account Number: [id number] Date of Birth/Sex: Treating RN: 05/27/1946 (77 y.o. F) Primary Care Provider: [name] Other Clinician: Referring Provider: Treating Provider/Extender: [name] Session in Treatment: 21 Subjective Chief Complaint Information obtained from Patient Patient presents for treatment of an open ulcer due to venous insufficiency History of Present Illness (HPI) ADMISSION This is a 77 year old woman with a history of chronic venous insufficiency status post saphenous vein ablations in 2010 and 2016. She also has a history of [name], [name] [id number] Page 7 of 12 May-Thurner syndrome status post stenting. She presents today with an open venous ulcer on her left lower leg. It has been present for a little over 2 weeks. She saw her primary care provider who apparently swabbed the wound and grew out Pseudomonas. She just completed a course of ciprofloxacin for this. ABI was 0.91. She reports that she has had previous issues with ulcers in this same location, stemming back to a punch biopsy taken by a dermatologist many years ago. She has had several skin substitutes applied to the area that have ultimately resulted in healing on prior occasions. She has 2 small ulcers on her left medial lower leg. There is slough accumulation in both of them. The more anterior of the 2 is quite small and has some soft slough on the surface, underneath which there is good granulation  tissue. The more medial wound also has slough accumulation, but the underlying surface is fairly fibrotic and gritty. This is consistent with her provided history of multiple ulcers in the same location. 08/03/2022: The anterior wound is smaller today with just a little bit of slough accumulation. The more medial wound continues to be very sensitive and fibrotic with slough buildup. 08/13/2022: The anterior wound is closed. The more medial wound remains sensitive with a fairly fibrotic surface. There is more granulation tissue filling in, however, and there is less slough than on prior occasions. 08/20/2022: The anterior wound remains closed. The more medial wound has filled with granulation tissue but still has a fair amount of slough on the surface and remains fairly tender. Unfortunately, she has developed 2 small ulcers just proximal to this. The fat layer is exposed in each. She says that over the weekend, she felt a burning sensation in that location. 08/27/2022: The 2 small wounds proximal to  the main wound have merged into a single site. The wounds look a little bit dry, but they are quite clean without significant slough accumulation. They remain quite tender. 09/03/2022: All of the wounds have now merged into 1 large wound. There is a strong odor coming from the wound and the surface is not particularly viable. It is extremely painful for her today. 09/10/2022: The wound is less black and purple this week but still does not look particularly viable. The surface is desiccated. The culture that I took last week returned with a polymicrobial population including Pseudomonas. I prescribed both Augmentin and levofloxacin. She has not yet picked up levofloxacin, as her pharmacy only notified her yesterday that it was ready. We have ordered a Keystone topical compounded antibiotic, but it has not yet come. 09/17/2022: The wound surface is markedly improved today. There is still an area of  grayish-looking muscle but the rest appears significantly more viable. There is still a layer of slough on the surface. She still has a couple days left of oral antibiotic therapy. She has her [employer] compounded topical antibiotic with her today. 09/24/2022: She has completed her oral antibiotic therapy. The wound surface is much cleaner today and more viable without any necrotic tissue. It is a bit desiccated, however. 10/01/2022: The moisture balance of the wound has improved. There is a layer of yellow slough on the surface, but beneath this, the wound is more pink. 10/08/2022: The wound is smaller and cleaner today with a layer of slough present. She is having less pain. 12/18; this patient has a new wound on the right lateral ankle which apparently started after a cat scratched her. Secondarily she managed to drop a cake pan on the same area. This is very painful. The original wound was on the left posterior calf we have been using [employer] under an [employer]. The Unna boot fell down and apparently she was in for a nurse visit perhaps last week and that 1 fell down as well This patient has central venous mediated hypertension. For member correctly she has a stent placed in the left common iliac artery by Dr. Donzetta Matters some years ago [facility] Thurner syndrome] she also has had venous ablations and I believe a history of a DVT The patient has compression pumps at home but she does not use them 10/30/2022: The patient says that her wounds burned all week with the [facility] classic in place. She has a fair amount of slough and nonviable subcutaneous tissue present on the right ankle. The left posterior calf wound is cleaner than I have seen it to date. Both remain fairly tender. 11/14/2022: Both wounds have substantial slough and eschar accumulation. They remain extremely tender. 11/21/2022: The wounds are cleaner this week, but still have slough and eschar accumulation. She continues to describe  burning pain in both wounds, but upon further questioning, she actually has burning in both of her feet that radiates up her legs and includes to the wounds. 11/28/2022: Both wounds have slough and eschar accumulation, as well as adherent silver alginate that was difficult to remove. She continues to have pain out of proportion to the extent of her wounds. Her PCP did initiate Lyrica which she started taking last night. The dose is quite low, only 25 mg, but apparently there is a plan for upward titration, assuming she tolerates the medication. 12/05/2022: The wounds are both a little bit smaller today, but have significant slough accumulation, as usual. They remain exquisitely tender.  She is now taking Lyrica twice a day. 12/19/2022: Both of her wounds look significantly better this week. There is slough buildup on both, but they are smaller. She seems to be getting a good response from Lyrica as she is having much less pain. 12/26/2022: The wound on her right lateral ankle is nearly closed. The wound on her left posterior calf is smaller but still has a lot of slough accumulation. They remain tender. Patient History Information obtained from Patient. Family History Cancer - Mother,Paternal Grandparents, Diabetes - Siblings, Heart Disease - Father, Hypertension - Father. Social History Former smoker - quit in 1967. Medical History Eyes Patient has history of Cataracts - L eye Ear/Nose/Mouth/Throat Denies history of Chronic sinus problems/congestion, Middle ear problems Hematologic/Lymphatic Denies history of Anemia, Human Immunodeficiency Virus, Lymphedema Respiratory Patient has history of Asthma Cardiovascular ROSICELA, [name] [id number] Page 8 of 12 Patient has history of Hypertension Endocrine Denies history of Type I Diabetes, Type II Diabetes Neurologic Patient has history of Neuropathy - Feet and finger Denies history of Seizure  Disorder Oncologic Patient has history of Received Chemotherapy - 2012, Received Radiation - 2012 Hospitalization/Surgery History - Lower extremity venography 2020. - Intravascular ultrasound. - peripheral vascular intervention. - melanoma 2011. 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 no acute distress. Vitals Time Taken: 3:40 PM, Height: 63 in, Weight: 200 lbs, BMI: 35.4, Temperature: 97.7 F, Pulse: 74 bpm, Respiratory Rate: 18 breaths/min, Blood Pressure: 159/83 mmHg. Respiratory Normal work of breathing on room air. General Notes: 12/26/2022: The wound on her right lateral ankle is nearly closed. The wound on her left posterior calf is smaller but still has a lot of slough accumulation. They remain tender. Integumentary (Hair, Skin) Wound #2 status is Open. Original cause of wound was Blister. The date acquired was: 07/06/2022. The wound has been in treatment 21 weeks. The wound is located on the Left,Posterior Lower Leg. The wound measures 4.8cm length x 1.9cm width x 0.1cm depth; 7.163cm^2 area and 0.716cm^3 volume. There is Fat Layer (Subcutaneous Tissue) exposed. There is no tunneling or undermining noted. There is a medium amount of serosanguineous drainage noted. The wound margin is flat and intact. There is small (1-33%) red granulation within the wound bed. There is a large (67-100%) amount of necrotic tissue within the wound bed including Eschar and Adherent Slough. The periwound skin appearance had no abnormalities noted for texture. The periwound skin appearance had no abnormalities noted for moisture. The periwound skin appearance had no abnormalities noted for color. Periwound temperature was noted as No  Abnormality. The periwound has tenderness on palpation. Wound #3 status is Open. Original cause of wound was Contusion/Bruise. The date acquired was: 10/19/2022. The wound has been in treatment 9 weeks. The wound is located on the Right,Lateral Ankle. The wound measures 0.5cm length x 0.6cm width x 0.1cm depth; 0.236cm^2 area and 0.024cm^3 volume. There is Fat Layer (Subcutaneous Tissue) exposed. There is no tunneling or undermining noted. There is a medium amount of serosanguineous drainage noted. The wound margin is flat and intact. There is medium (34-66%) pink granulation within the wound bed. There is a medium (34-66%) amount of necrotic tissue within the wound bed including Adherent Slough. The periwound skin appearance had no abnormalities noted for texture. The periwound skin appearance had no abnormalities noted for moisture. The periwound skin  appearance did not exhibit: Erythema. Periwound temperature was noted as No Abnormality. The periwound has tenderness on palpation. Assessment Active Problems ICD-10 Non-pressure chronic ulcer of other part of left lower leg with fat layer exposed Non-pressure chronic ulcer of other part of right lower leg with other specified severity Varicose veins of bilateral lower extremities with other complications Venous insufficiency (chronic) (peripheral) Polyneuropathy, unspecified Localized edema Cellulitis of right lower limb Procedures JACQUELLINE, FECHTER ([id number]) [id number] Page 9 of 12 Wound #2 Pre-procedure diagnosis of Wound #2 is a Venous Leg Ulcer located on the Left,Posterior Lower Leg .Severity of Tissue Pre Debridement is: Fat layer exposed. There was a Selective/Open Wound Non-Viable Tissue Debridement with a total area of 9.12 sq cm performed by Fredirick Maudlin, MD. With the following instrument(s): Curette to remove Non-Viable tissue/material. Material removed includes [address] [facility] after achieving pain control  using Lidocaine 4% Topical Solution. No specimens were taken. A time out was conducted at 15:57, prior to the start of the procedure. A Minimum amount of bleeding was controlled with Pressure. The procedure was tolerated well. Post Debridement Measurements: 4.8cm length x 1.9cm width x 0.1cm depth; 0.716cm^3 volume. Character of Wound/Ulcer Post Debridement requires further debridement. Severity of Tissue Post Debridement is: Fat layer exposed. Post procedure Diagnosis Wound #2: Same as Pre-Procedure General Notes: Scribed for Dr. Celine Ahr by Blanche East, RN. Wound #3 Pre-procedure diagnosis of Wound #3 is a Venous Leg Ulcer located on the Right,Lateral Ankle .Severity of Tissue Pre Debridement is: Fat layer exposed. There was a Selective/Open Wound Non-Viable Tissue Debridement with a total area of 0.3 sq cm performed by Fredirick Maudlin, MD. With the following instrument(s): Curette to remove Non-Viable tissue/material. Material removed includes [facility] after achieving pain control using Lidocaine 4% Topical Solution. No specimens were taken. A time out was conducted at 15:57, prior to the start of the procedure. A Minimum amount of bleeding was controlled with Pressure. The procedure was tolerated well. Post Debridement Measurements: 0.5cm length x 0.6cm width x 0.1cm depth; 0.024cm^3 volume. Character of Wound/Ulcer Post Debridement requires further debridement. Severity of Tissue Post Debridement is: Fat layer exposed. Post procedure Diagnosis Wound #3: Same as Pre-Procedure General Notes: Scribed for Dr. Celine Ahr by Blanche East, RN. Plan Follow-up Appointments: Return Appointment in 1 week. - Dr. Celine Ahr RM 3 Anesthetic: Wound #2 Left,Posterior Lower Leg: (In clinic) Topical Lidocaine 5% applied to wound bed - in clinic, prior to debridement (In clinic) Topical Lidocaine 4% applied to wound bed Bathing/ Shower/ Hygiene: May shower with protection but do not get wound dressing(s) wet. Protect  dressing(s) with water repellant cover (for example, large plastic bag) or a cast cover and may then take shower. Edema Control - Lymphedema / SCD / Other: Lymphedema Pumps. Use Lymphedema pumps on leg(s) 2-3 times a day for 45-60 minutes. If wearing any wraps or hose, do not remove them. Continue exercising as instructed. - Use x1 per day Avoid standing for long periods of time. Exercise regularly WOUND #2: - Lower Leg Wound Laterality: Left, Posterior Cleanser: Soap and Water 1 x Per Week/30 Days Discharge Instructions: May shower and wash wound with dial antibacterial soap and water prior to dressing change. Peri-Wound Care: Sween Lotion (Moisturizing lotion) 1 x Per Week/30 Days Discharge Instructions: Apply moisturizing lotion as directed Topical: Keystone 1 x Per Week/30 Days Discharge Instructions: Apply as directed to wound Prim Dressing: Santyl Ointment 1 x Per Week/30 Days ary Discharge Instructions: Apply nickel thick amount to wound bed as instructed Secondary Dressing:  T Non-adherent Dressing, 2x3 in 1 x Per Week/30 Days elfa Discharge Instructions: Apply over primary dressing as directed. Secondary Dressing: Zetuvit Plus 4x8 in 1 x Per Week/30 Days Discharge Instructions: Apply over primary dressing as directed. Secured With: 10M Medipore Public affairs consultant Surgical T 2x10 (in/yd) 1 x Per Week/30 Days ape Discharge Instructions: Secure with tape as directed. Com pression Wrap: Unnaboot w/Calamine, 4x10 (in/yd) 1 x Per Week/30 Days Discharge Instructions: Apply Unnaboot as directed. WOUND #3: - Ankle Wound Laterality: Right, Lateral Cleanser: Soap and Water 1 x Per Week/30 Days Discharge Instructions: May shower and wash wound with dial antibacterial soap and water prior to dressing change. Peri-Wound Care: Sween Lotion (Moisturizing lotion) 1 x Per Week/30 Days Discharge Instructions: Apply moisturizing lotion as directed Topical: Keystone 1 x Per Week/30 Days Discharge  Instructions: Apply as directed to wound Prim Dressing: Santyl Ointment 1 x Per Week/30 Days ary Discharge Instructions: Apply nickel thick amount to wound bed as instructed Secondary Dressing: T Non-adherent Dressing, 2x3 in 1 x Per Week/30 Days elfa Discharge Instructions: Apply over primary dressing as directed. Secondary Dressing: Woven Gauze Sponge, Non-Sterile 4x4 in 1 x Per Week/30 Days Discharge Instructions: Apply over primary dressing as directed. Secured With: 10M Medipore Public affairs consultant Surgical T 2x10 (in/yd) 1 x Per Week/30 Days ape Discharge Instructions: Secure with tape as directed. Com pression Wrap: Unnaboot w/Calamine, 4x10 (in/yd) 1 x Per Week/30 Days Discharge Instructions: Apply Unnaboot as directed. 12/26/2022: The wound on her right lateral ankle is nearly closed. The wound on her left posterior calf is smaller but still has a lot of slough accumulation. They remain tender. I used a curette to debride slough from both wound sites. We will continue Keystone topical antibiotic compound and Santyl on both wounds with bilateral calamine based Coflex wrap. I anticipate that the right ankle wound will likely be healed at her visit next week. Electronic Signature(s) Signed: 12/26/2022 4:23:26 PM By: Fredirick Maudlin MD [place], [name] [id number] Page 10 of 12 Signed: 12/26/2022 4:23:26 PM By: Fredirick Maudlin MD FACS Entered By: Fredirick Maudlin on 12/26/2022 16:23:26 -------------------------------------------------------------------------------- HxROS Details Patient Name: Date of Service: KHALIDAH, SEGRETI 12/26/2022 3:45 PM Medical Record Number: [id number] Patient Account Number: [id number] Date of Birth/Sex: Treating RN: [birth year] (77 y.o. F) Primary Care Provider: [name] Other Clinician: Referring Provider: Treating Provider/Extender: [name] Session in Treatment: 21 Information Obtained  From Patient Eyes Medical History: Positive for: Cataracts - L eye Ear/Nose/Mouth/Throat Medical History: Negative for: Chronic sinus problems/congestion; Middle ear problems Hematologic/Lymphatic Medical History: Negative for: Anemia; Human Immunodeficiency Virus; Lymphedema Respiratory Medical History: Positive for: Asthma Past Medical History Notes: OSA on CPAP Cardiovascular Medical History: Positive for: Hypertension Past Medical History Notes: Cronic venous insufficiency 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 Gastrointestinal Medical History: Past Medical History Notes: liver cyst Endocrine Medical History: Negative for: Type I Diabetes; Type II Diabetes Musculoskeletal Medical History: Past Medical History Notes: arthritis Neurologic Medical History: Positive for: Neuropathy - Feet and finger Negative for: Seizure Disorder Past Medical History Notes: restless leg syndrome CARY, VANDERVORT ([id number]) [id number] Page 11 of 12 Oncologic Medical History: Positive for: Received Chemotherapy - 2012; Received Radiation - 2012 HBO Extended History Items Eyes: Cataracts Immunizations Pneumococcal Vaccine: Received Pneumococcal Vaccination: Yes Received Pneumococcal Vaccination On or After 60th Birthday: Yes Implantable Devices No devices added Hospitalization / Surgery History Type of Hospitalization/Surgery Lower extremity venography 2020 Intravascular ultrasound peripheral vascular intervention melanoma 2011 Family and Social  History Cancer: Yes - Mother,Paternal Grandparents; Diabetes: Yes - Siblings; Heart Disease: Yes - Father; Hypertension: Yes - Father; Former smoker - quit in [employer]) Signed: 12/26/2022 4:24:31 PM By: Fredirick Maudlin MD FACS Entered By: Fredirick Maudlin on 12/26/2022  16:15:59 -------------------------------------------------------------------------------- SuperBill Details Patient Name: Date of Service: CHUA, SAVOY 12/26/2022 Medical Record Number: [id number] Patient Account Number: [id number] Date of Birth/Sex: Treating RN: [birth year] (77 y.o. F) Primary Care Provider: [name] Other Clinician: Referring Provider: Treating Provider/Extender: [name] Session in Treatment: 21 Diagnosis Coding ICD-10 Codes Code Description [phone] Non-pressure chronic ulcer of other part of left lower leg with fat layer exposed L97.818 Non-pressure chronic ulcer of other part of right lower leg with other specified severity I83.893 Varicose veins of bilateral lower extremities with other complications [id number] Venous insufficiency (chronic) (peripheral) G62.9 Polyneuropathy, unspecified R60.0 Localized edema L03.115 Cellulitis of right lower limb Facility Procedures : CPT4 Code: [id number] Description: [id number] - DEBRIDE WOUND 1ST 20 SQ CM OR < ICD-10 Diagnosis Description L97.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d L97.818 Non-pressure chronic ulcer of other part of right lower leg with other  specified s Modifier: everity Quantity: 1 Physician Procedures ARISBETH, DAUBE ([id number]): CPT4 Code Description [id number] - WC PHYS LEVEL 4 - EST PT ICD-10 Diagnosis Description L97.822 Non-pressure chronic ulcer of other part of left lower leg with fa L97.818 Non-pressure chronic ulcer of other part of right  lower leg with o I87.2 Venous insufficiency (chronic) (peripheral) [id number] Varicose veins of bilateral lower extremities with other complicat [id number] Page 12 of 12: Quantity Modifier 25 1 t layer exposed ther specified severity ions GERALDINE, ALLAIN ([id number]): [id number] - WC PHYS DEBR WO ANESTH 20 SQ CM ICD-10 Diagnosis Description L97.822 Non-pressure  chronic ulcer of other part of left lower leg with fa L97.818 Non-pressure chronic ulcer of other part of right lower leg with  o [id number] Page 12 of 12: 1 t layer exposed ther specified severity Electronic Signature(s) Signed: 12/26/2022 4:24:03 PM By: Fredirick Maudlin MD FACS Entered By: Fredirick Maudlin on 12/26/2022 16:24:03

## 2023-01-02 ENCOUNTER — Encounter (HOSPITAL_BASED_OUTPATIENT_CLINIC_OR_DEPARTMENT_OTHER): Payer: Medicare Other | Admitting: General Surgery

## 2023-01-02 DIAGNOSIS — Q969 Turner's syndrome, unspecified: Secondary | ICD-10-CM | POA: Diagnosis not present

## 2023-01-02 DIAGNOSIS — I10 Essential (primary) hypertension: Secondary | ICD-10-CM | POA: Diagnosis not present

## 2023-01-02 DIAGNOSIS — L97312 Non-pressure chronic ulcer of right ankle with fat layer exposed: Secondary | ICD-10-CM | POA: Diagnosis not present

## 2023-01-02 DIAGNOSIS — L97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d: Secondary | ICD-10-CM | POA: Diagnosis not present

## 2023-01-02 DIAGNOSIS — I872 Venous insufficiency (chronic) (peripheral): Secondary | ICD-10-CM | POA: Diagnosis not present

## 2023-01-02 DIAGNOSIS — L97818 Non-pressure chronic ulcer of other part of right lower leg with other specified severity: Secondary | ICD-10-CM | POA: Diagnosis not present

## 2023-01-02 DIAGNOSIS — L97222 Non-pressure chronic ulcer of left calf with fat layer exposed: Secondary | ICD-10-CM | POA: Diagnosis not present

## 2023-01-02 DIAGNOSIS — G629 Polyneuropathy, unspecified: Secondary | ICD-10-CM | POA: Diagnosis not present

## 2023-01-02 DIAGNOSIS — G4733 Obstructive sleep apnea (adult) (pediatric): Secondary | ICD-10-CM | POA: Diagnosis not present

## 2023-01-03 NOTE — Progress Notes (Signed)
Amber, [name] ([id number]) [id number] Page 1 of 10 Visit Report for 01/02/2023 Arrival Information Details Patient Name: Date of Service: [name], [name] 01/02/2023 2:15 PM Medical Record Number: [id number] Patient Account Number: [id number] Date of Birth/Sex: Treating RN: 06/30/1946 (77 y.o. [name], [name] Primary Care Aidel Davisson: [name] Other Clinician: Referring Cranford Blessinger: Treating Felicia Bloomquist/Extender: [name] Session in Treatment: 70 Visit Information History Since Last Visit Added or deleted any medications: No Patient Arrived: Ambulatory Any new allergies or adverse reactions: No Arrival Time: 14:22 Had a fall or experienced change in No Accompanied By: self activities of daily living that may affect Transfer Assistance: None risk of falls: Patient Identification Verified: Yes Signs or symptoms of abuse/neglect since last visito No Secondary Verification Process Completed: Yes Hospitalized since last visit: No Patient Requires Transmission-Based Precautions: No Implantable device outside of the clinic excluding No Patient Has Alerts: No cellular tissue based products placed in the center since last visit: Has Compression in Place as Prescribed: Yes Pain Present Now: No Electronic Signature(s) Signed: 01/02/2023 4:01:42 PM By: Blanche East RN Entered By: Blanche East on 01/02/2023 14:22:48 -------------------------------------------------------------------------------- Compression Therapy Details Patient Name: Date of Service: [name], [name] 01/02/2023 2:15 PM Medical Record Number: [id number] Patient Account Number: [id number] Date of Birth/Sex: Treating RN: 1946-03-10 (77 y.o. [name] Primary Care Sahily Biddle: [name] Other Clinician: Referring Tashea Othman: Treating Diala Waxman/Extender: [name] Session in Treatment: 22 Compression Therapy Performed for Wound Assessment:  Wound #2 Left,Posterior Lower Leg Performed By: Clinician Blanche East, RN Compression Type: Double Layer Post Procedure Diagnosis Same as Pre-procedure Electronic Signature(s) Signed: 01/02/2023 4:01:42 PM By: Blanche East RN Entered By: Blanche East on 01/02/2023 14:54:53 -------------------------------------------------------------------------------- Compression Therapy Details Patient Name: Date of Service: Amber, [name] 01/02/2023 2:15 PM Medical Record Number: [id number] Patient Account Number: [id number] Date of Birth/Sex: Treating RN: [birth year] (77 y.o. [name] Primary Care Maronda Caison: [name] Other Clinician: Referring Bearl Talarico: Treating Rosario Kushner/Extender: [name] Session in Treatment: 22 Compression Therapy Performed for Wound Assessment: Wound #3 Right,Lateral Ankle Performed By: Clinician Blanche East, RN Compression Type: Double Layer Post Procedure Diagnosis Same as Vivi Barrack ([id number]) [id number] Page 2 of 10 Electronic Signature(s) Signed: 01/02/2023 4:01:42 PM By: Blanche East RN Entered By: Blanche East on 01/02/2023 14:54:53 -------------------------------------------------------------------------------- Encounter Discharge Information Details Patient Name: Date of Service: Amber, [name] 01/02/2023 2:15 PM Medical Record Number: [id number] Patient Account Number: [id number] Date of Birth/Sex: Treating RN: May 13, 1946 (77 y.o. [name] Primary Care Vishruth Seoane: [name] Other Clinician: Referring Janoah Menna: Treating Shania Bjelland/Extender: [name] Session in Treatment: 22 Encounter Discharge Information Items Discharge Condition: Stable Ambulatory Status: Ambulatory Discharge Destination: Home Transportation: Private Auto Accompanied By: self Schedule Follow-up Appointment: Yes Clinical Summary of Care: Electronic  Signature(s) Signed: 01/02/2023 2:39:54 PM By: Blanche East RN Entered By: Blanche East on 01/02/2023 14:39:54 -------------------------------------------------------------------------------- Lower Extremity Assessment Details Patient Name: Date of Service: [name], [name] 01/02/2023 2:15 PM Medical Record Number: [id number] Patient Account Number: [id number] Date of Birth/Sex: Treating RN: November 04, 1946 (77 y.o. [name] Primary Care Travares Nelles: [name] Other Clinician: Referring Shayli Altemose: Treating Nirav Sweda/Extender: Pixie Casino Weeks in Treatment: 22 Edema Assessment Assessed: [Left: No] [Right: No] Edema: [Left: Ye] [Right: s] Calf Left: Right: Point of Measurement: From Medial Instep 41 cm 41 cm Ankle Left: Right: Point of Measurement: From Medial Instep 23 cm 23 cm Vascular Assessment Pulses: Dorsalis Pedis Palpable: [Left:Yes] [Right:Yes] Electronic Signature(s) Signed: 01/02/2023 4:01:42  PM By: Blanche East RN Entered By: Blanche East on 01/02/2023 14:24:05 -------------------------------------------------------------------------------- Multi Wound Chart Details Patient Name: Date of Service: Amber, [name] 01/02/2023 2:15 PM [name] [id number] Page 3 of 10 Medical Record Number: [id number] Patient Account Number: [id number] Date of Birth/Sex: Treating RN: [birth year] (77 y.o. F) Primary Care Jermone Geister: [name] Other Clinician: Referring Renetta Suman: Treating Tadd [name]/Extender: [name] Session in Treatment: 22 Vital Signs Height(in): 37 Pulse(bpm): 89 Weight(lbs): 200 Blood Pressure(mmHg): 179/96 Body Mass Index(BMI): 35.4 Temperature(F): 98.6 Respiratory Rate(breaths/min): 18 [2:Photos:] [N/A:N/A] Left, Posterior Lower Leg Right, Lateral Ankle N/A Wound Location: Blister Contusion/Bruise N/A Wounding Event: Venous Leg Ulcer Venous Leg Ulcer  N/A Primary Etiology: Cataracts, Asthma, Hypertension, Cataracts, Asthma, Hypertension, N/A Comorbid History: Neuropathy, Received Chemotherapy, Neuropathy, Received Chemotherapy, Received Radiation Received Radiation 07/06/2022 10/19/2022 N/A Date Acquired: 22 10 N/A Weeks of Treatment: Open Open N/A Wound Status: No No N/A Wound Recurrence: 4.8x1.8x0.1 0.3x0.4x0.1 N/A Measurements L x W x D (cm) 6.786 0.094 N/A A (cm) : rea 0.679 0.009 N/A Volume (cm) : 1.30% 96.50% N/A % Reduction in A rea: 1.20% 96.60% N/A % Reduction in Volume: Full Thickness Without Exposed Full Thickness Without Exposed N/A Classification: Support Structures Support Structures Medium Medium N/A Exudate A mount: Serosanguineous Serosanguineous N/A Exudate Type: red, brown red, brown N/A Exudate Color: Flat and Intact Flat and Intact N/A Wound Margin: Small (1-33%) Medium (34-66%) N/A Granulation A mount: Red Pink N/A Granulation Quality: Large (67-100%) Medium (34-66%) N/A Necrotic A mount: Eschar, Adherent Slough Adherent Slough N/A Necrotic Tissue: Fat Layer (Subcutaneous Tissue): Yes Fat Layer (Subcutaneous Tissue): Yes N/A Exposed Structures: Fascia: No Fascia: No Tendon: No Tendon: No Muscle: No Muscle: No Joint: No Joint: No Bone: No Bone: No Small (1-33%) Small (1-33%) N/A Epithelialization: Debridement - Selective/Open Wound Debridement - Selective/Open Wound N/A Debridement: Pre-procedure Verification/Time Out 14:47 14:47 N/A Taken: [facility] N/A Tissue Debrided: Non-Viable Tissue Non-Viable Tissue N/A Level: 8.64 0.12 N/A Debridement A (sq cm): rea Curette Curette N/A Instrument: Minimum Minimum N/A Bleeding: Pressure Pressure N/A Hemostasis A chieved: 0 0 N/A Procedural Pain: 0 0 N/A Post Procedural Pain: Procedure was tolerated well Procedure was tolerated well N/A Debridement Treatment Response: 4.8x1.8x0.1 0.3x0.4x0.1 N/A Post Debridement  Measurements L x W x D (cm) 0.679 0.009 N/A Post Debridement Volume: (cm) Excoriation: No Excoriation: Yes N/A Periwound Skin Texture: Maceration: No Maceration: Yes N/A Periwound Skin Moisture: No Abnormalities Noted Erythema: No N/A Periwound Skin Color: No Abnormality No Abnormality N/A Temperature: Yes Yes N/A Tenderness on Palpation: Compression Therapy Compression Therapy N/A Procedures Performed: Debridement Debridement Treatment Notes SOMALIA, SPIZZIRRI ([id number]) [id number] Page 4 of 10 Wound #2 (Lower Leg) Wound Laterality: Left, Posterior Cleanser Soap and Water Discharge Instruction: May shower and wash wound with dial antibacterial soap and water prior to dressing change. Peri-Wound Care Sween Lotion (Moisturizing lotion) Discharge Instruction: Apply moisturizing lotion as directed Topical Keystone Discharge Instruction: Apply as directed to wound Primary Dressing Santyl Ointment Discharge Instruction: Apply nickel thick amount to wound bed as instructed Secondary Dressing T Non-adherent Dressing, 2x3 in elfa Discharge Instruction: Apply over primary dressing as directed. Zetuvit Plus 4x8 in Discharge Instruction: Apply over primary dressing as directed. Secured With [employer] Surgical T 2x10 (in/yd) ape Discharge Instruction: Secure with tape as directed. Compression Wrap Unnaboot w/Calamine, 4x10 (in/yd) Discharge Instruction: Apply Unnaboot as directed. Compression Stockings Add-Ons Wound #3 (Ankle) Wound Laterality: Right, Lateral Cleanser Soap and Water Discharge Instruction: May shower and  wash wound with dial antibacterial soap and water prior to dressing change. Peri-Wound Care Sween Lotion (Moisturizing lotion) Discharge Instruction: Apply moisturizing lotion as directed Topical Keystone Discharge Instruction: Apply as directed to wound Primary Dressing Santyl Ointment Discharge Instruction:  Apply nickel thick amount to wound bed as instructed Secondary Dressing T Non-adherent Dressing, 2x3 in elfa Discharge Instruction: Apply over primary dressing as directed. Woven Gauze Sponge, Non-Sterile 4x4 in Discharge Instruction: Apply over primary dressing as directed. Secured With [employer] Surgical T 2x10 (in/yd) ape Discharge Instruction: Secure with tape as directed. Compression Wrap Unnaboot w/Calamine, 4x10 (in/yd) Discharge Instruction: Apply Unnaboot as directed. Compression Stockings Add-Ons ZENOBIA, ALDERFER ([id number]) [id number] Page 5 of 10 Electronic Signature(s) Signed: 01/02/2023 3:10:16 PM By: Fredirick Maudlin MD FACS Entered By: Fredirick Maudlin on 01/02/2023 15:10:15 -------------------------------------------------------------------------------- Multi-Disciplinary Care Plan Details Patient Name: Date of Service: TIPHANI, VILLASANA 01/02/2023 2:15 PM Medical Record Number: [id number] Patient Account Number: [id number] Date of Birth/Sex: Treating RN: 1946-09-07 (77 y.o. [name] Primary Care Rola Lennon: [name] Other Clinician: Referring Jceon Alverio: Treating Anitra Doxtater/Extender: [name] Session in Treatment: 22 Active Inactive Wound/Skin Impairment Nursing Diagnoses: Impaired tissue integrity Goals: Ulcer/skin breakdown will have a volume reduction of 30% by week 4 Date Initiated: 08/03/2022 Date Inactivated: 09/10/2022 Target Resolution Date: 08/31/2022 Unmet Reason: uncontrolled edema, Goal Status: Unmet acute infection Ulcer/skin breakdown will have a volume reduction of 50% by week 8 Date Initiated: 09/10/2022 Target Resolution Date: 04/05/2023 Goal Status: Active Interventions: Assess ulceration(s) every visit Treatment Activities: Skin care regimen initiated : 08/03/2022 Notes: Keystone ordered. 10/30 RX Levo started. 09/10/22 Electronic Signature(s) Signed: 01/02/2023  4:01:42 PM By: Blanche East RN Entered By: Blanche East on 01/02/2023 14:37:22 -------------------------------------------------------------------------------- Pain Assessment Details Patient Name: Date of Service: ANUM, CARLI 01/02/2023 2:15 PM Medical Record Number: [id number] Patient Account Number: [id number] Date of Birth/Sex: Treating RN: 09-12-1946 (77 y.o. [name] Primary Care Matricia Begnaud: [name] Other Clinician: Referring Jamar Casagrande: Treating Delmus Warwick/Extender: [name] Session in Treatment: 22 Active Problems Location of Pain Severity and Description of Pain Patient Has Paino No Site Locations Rate the pain. VICTOREA, SENDELBACH ([id number]) [id number] Page 6 of 10 Rate the pain. Current Pain Level: 0 Pain Management and Medication Current Pain Management: Electronic Signature(s) Signed: 01/02/2023 4:01:42 PM By: Blanche East RN Entered By: Blanche East on 01/02/2023 14:23:19 -------------------------------------------------------------------------------- Patient/Caregiver Education Details Patient Name: Date of Service: [name] 2/28/2024andnbsp2:15 PM Medical Record Number: [id number] Patient Account Number: [id number] Date of Birth/Gender: Treating RN: Oct 02, 1946 (77 y.o. [name] Primary Care Physician: [name] Other Clinician: Referring Physician: Treating Physician/Extender: [name] Session in Treatment: 22 Education Assessment Education Provided To: Patient Education Topics Provided Wound Debridement: Methods: Explain/Verbal Responses: Reinforcements needed, State content correctly Wound/Skin Impairment: Methods: Explain/Verbal Responses: Reinforcements needed, State content correctly Electronic Signature(s) Signed: 01/02/2023 4:01:42 PM By: Blanche East RN Entered By: Blanche East on 01/02/2023  14:37:42 -------------------------------------------------------------------------------- Wound Assessment Details Patient Name: Date of Service: LYRIC, HONNOLD 01/02/2023 2:15 PM Medical Record Number: [id number] Patient Account Number: [id number] Date of Birth/Sex: Treating RN: 10/12/1946 (77 y.o. [name] Primary Care Louvenia Golomb: [name] Other Clinician: Referring Breannah Kratt: Treating Enzley Kitchens/Extender: Pixie Casino Weeks in Treatment: 22 Wound Status Wound Number: 2 Primary Venous Leg Ulcer Etiology: Wound Location: Left, Posterior Lower Leg ANNAELLE, VALLONE ([id number]) [id number] Page 7 of 10 Wound Open Wounding Event: Blister Status: Date Acquired: 07/06/2022 Comorbid Cataracts, Asthma, Hypertension, Neuropathy, Received  Weeks Of Treatment: 22 History: Chemotherapy, Received Radiation Clustered Wound: No Photos Wound Measurements Length: (cm) 4.8 Width: (cm) 1.8 Depth: (cm) 0.1 Area: (cm) 6.786 Volume: (cm) 0.679 % Reduction in Area: 1.3% % Reduction in Volume: 1.2% Epithelialization: Small (1-33%) Tunneling: No Undermining: No Wound Description Classification: Full Thickness Without Exposed Support Structures Wound Margin: Flat and Intact Exudate Amount: Medium Exudate Type: Serosanguineous Exudate Color: red, brown Foul Odor After Cleansing: No Slough/Fibrino Yes Wound Bed Granulation Amount: Small (1-33%) Exposed Structure Granulation Quality: Red Fascia Exposed: No Necrotic Amount: Large (67-100%) Fat Layer (Subcutaneous Tissue) Exposed: Yes Necrotic Quality: Eschar, Adherent Slough Tendon Exposed: No Muscle Exposed: No Joint Exposed: No Bone Exposed: No Periwound Skin Texture Texture Color No Abnormalities Noted: Yes No Abnormalities Noted: Yes Moisture Temperature / Pain No Abnormalities Noted: Yes Temperature: No Abnormality Tenderness on Palpation: Yes Treatment Notes Wound #2  (Lower Leg) Wound Laterality: Left, Posterior Cleanser Soap and Water Discharge Instruction: May shower and wash wound with dial antibacterial soap and water prior to dressing change. Peri-Wound Care Sween Lotion (Moisturizing lotion) Discharge Instruction: Apply moisturizing lotion as directed Topical Keystone Discharge Instruction: Apply as directed to wound Primary Dressing Santyl Ointment Discharge Instruction: Apply nickel thick amount to wound bed as instructed Secondary Dressing T Non-adherent Dressing, 2x3 in elfa Discharge Instruction: Apply over primary dressing as directed. LAUREE, OLSZEWSKI ([id number]) [id number] Page 8 of 10 Zetuvit Plus 4x8 in Discharge Instruction: Apply over primary dressing as directed. Secured With [employer] Surgical T 2x10 (in/yd) ape Discharge Instruction: Secure with tape as directed. Compression Wrap Unnaboot w/Calamine, 4x10 (in/yd) Discharge Instruction: Apply Unnaboot as directed. Compression Stockings Add-Ons Electronic Signature(s) Signed: 01/02/2023 4:01:42 PM By: Blanche East RN Entered By: Blanche East on 01/02/2023 14:34:22 -------------------------------------------------------------------------------- Wound Assessment Details Patient Name: Date of Service: MATEJA, HOUSEHOLDER 01/02/2023 2:15 PM Medical Record Number: [id number] Patient Account Number: [id number] Date of Birth/Sex: Treating RN: Sep 11, 1946 (77 y.o. [name], [name] Primary Care Marshon Bangs: [name] Other Clinician: Referring Doneta Bayman: Treating Magaby Rumberger/Extender: Pixie Casino Weeks in Treatment: 22 Wound Status Wound Number: 3 Primary Venous Leg Ulcer Etiology: Wound Location: Right, Lateral Ankle Wound Open Wounding Event: Contusion/Bruise Status: Date Acquired: 10/19/2022 Comorbid Cataracts, Asthma, Hypertension, Neuropathy, Received Weeks Of Treatment: 10 History: Chemotherapy, Received  Radiation Clustered Wound: No Photos Wound Measurements Length: (cm) 0.3 Width: (cm) 0.4 Depth: (cm) 0.1 Area: (cm) 0.094 Volume: (cm) 0.009 % Reduction in Area: 96.5% % Reduction in Volume: 96.6% Epithelialization: Small (1-33%) Tunneling: No Undermining: No Wound Description Classification: Full Thickness Without Exposed Support Structures Wound Margin: Flat and Intact Exudate Amount: Medium Exudate Type: Serosanguineous Exudate Color: red, brown Foul Odor After Cleansing: No Slough/Fibrino Yes Wound Bed Granulation Amount: Medium (34-66%) Exposed Structure Granulation Quality: Pink Fascia Exposed: No Necrotic Amount: Medium (34-66%) Fat Layer (Subcutaneous Tissue) Exposed: Yes Necrotic Quality: Adherent Slough Tendon Exposed: No Muscle Exposed: No Joint Exposed: No ZAIMA, CHMIELEWSKI ([id number]) [id number] Page 9 of 10 Bone Exposed: No Periwound Skin Texture Texture Color No Abnormalities Noted: Yes No Abnormalities Noted: No Erythema: No Moisture No Abnormalities Noted: Yes Temperature / Pain Temperature: No Abnormality Tenderness on Palpation: Yes Treatment Notes Wound #3 (Ankle) Wound Laterality: Right, Lateral Cleanser Soap and Water Discharge Instruction: May shower and wash wound with dial antibacterial soap and water prior to dressing change. Peri-Wound Care Sween Lotion (Moisturizing lotion) Discharge Instruction: Apply moisturizing lotion as directed Topical Keystone Discharge Instruction: Apply as directed to wound Primary Dressing Santyl Ointment Discharge Instruction:  Apply nickel thick amount to wound bed as instructed Secondary Dressing T Non-adherent Dressing, 2x3 in elfa Discharge Instruction: Apply over primary dressing as directed. Woven Gauze Sponge, Non-Sterile 4x4 in Discharge Instruction: Apply over primary dressing as directed. Secured With [employer] Surgical T 2x10 (in/yd) ape Discharge  Instruction: Secure with tape as directed. Compression Wrap Unnaboot w/Calamine, 4x10 (in/yd) Discharge Instruction: Apply Unnaboot as directed. Compression Stockings Add-Ons Electronic Signature(s) Signed: 01/02/2023 4:01:42 PM By: Blanche East RN Entered By: Blanche East on 01/02/2023 14:34:58 -------------------------------------------------------------------------------- Vitals Details Patient Name: Date of Service: [name] 01/02/2023 2:15 PM Medical Record Number: [id number] Patient Account Number: [id number] Date of Birth/Sex: Treating RN: 1946-06-24 (77 y.o. [name], [name] Primary Care Brantleigh Mifflin: [name] Other Clinician: Referring Gurnoor Ursua: Treating Camaya Gannett/Extender: [name] Session in Treatment: 22 Vital Signs Time Taken: 14:22 Temperature (F): 98.6 Height (in): 63 Pulse (bpm): 83 Weight (lbs): 200 Respiratory Rate (breaths/min): 18 Body Mass Index (BMI): 35.4 Blood Pressure (mmHg): 179/96 Reference Range: 80 - 120 mg / dl RASA, MATKOWSKI ([id number]) [id number] Page 10 of 10 Electronic Signature(s) Signed: 01/02/2023 4:01:42 PM By: Blanche East RN Entered By: Blanche East on 01/02/2023 14:23:04

## 2023-01-03 NOTE — Progress Notes (Signed)
COURNTEY, DENATALE ([id number]) [id number] Page 1 of 11 Visit Report for 01/02/2023 Chief Complaint Document Details Patient Name: Date of Service: [name], [name] 01/02/2023 2:15 PM Medical Record Number: [id number] Patient Account Number: [id number] Date of Birth/Sex: Treating RN: Oct 02, 1946 (77 y.o. F) Primary Care Provider: [name] Other Clinician: Referring Provider: Treating Provider/Extender: [name] Session in Treatment: 22 Information Obtained from: Patient Chief Complaint Patient presents for treatment of an open ulcer due to venous insufficiency Electronic Signature(s) Signed: 01/02/2023 3:10:22 PM By: Fredirick Maudlin MD FACS Entered By: Fredirick Maudlin on 01/02/2023 15:10:22 -------------------------------------------------------------------------------- Debridement Details Patient Name: Date of Service: [name], [name] 01/02/2023 2:15 PM Medical Record Number: [id number] Patient Account Number: [id number] Date of Birth/Sex: Treating RN: 04-04-1946 (77 y.o. [name], [name] Primary Care Provider: [name] Other Clinician: Referring Provider: Treating Provider/Extender: [name] Session in Treatment: 22 Debridement Performed for Assessment: Wound #3 Right,Lateral Ankle Performed By: Physician Fredirick Maudlin, MD Debridement Type: Debridement Severity of Tissue Pre Debridement: Fat layer exposed Level of Consciousness (Pre-procedure): Awake and Alert Pre-procedure Verification/Time Out Yes - 14:47 Taken: Start Time: 14:48 T Area Debrided (L x W): otal 0.3 (cm) x 0.4 (cm) = 0.12 (cm) Tissue and other material debrided: Non-Viable, Slough, Slough Level: Non-Viable Tissue Debridement Description: Selective/Open Wound Instrument: Curette Bleeding: Minimum Hemostasis Achieved: Pressure Procedural Pain: 0 Post Procedural Pain: 0 Response to Treatment: Procedure was tolerated  well Level of Consciousness (Post- Awake and Alert procedure): Post Debridement Measurements of Total Wound Length: (cm) 0.3 Width: (cm) 0.4 Depth: (cm) 0.1 Volume: (cm) 0.009 Character of Wound/Ulcer Post Debridement: Requires Further Debridement Severity of Tissue Post Debridement: Fat layer exposed Post Procedure Diagnosis Same as Pre-procedure Notes Scribed for Dr. Celine Ahr by Blanche East, RN Electronic Signature(s) Signed: 01/02/2023 4:01:42 PM By: Blanche East RN Signed: 01/02/2023 4:13:19 PM By: Fredirick Maudlin MD [place], [place] ([id number]) [id number] Page 2 of 11 Entered By: Blanche East on 01/02/2023 14:49:47 -------------------------------------------------------------------------------- Debridement Details Patient Name: Date of Service: [name], [name] 01/02/2023 2:15 PM Medical Record Number: [id number] Patient Account Number: [id number] Date of Birth/Sex: Treating RN: [birth year] (77 y.o. [name], [name] Primary Care Provider: [name] Other Clinician: Referring Provider: Treating Provider/Extender: [name] Session in Treatment: 22 Debridement Performed for Assessment: Wound #2 Left,Posterior Lower Leg Performed By: Physician Fredirick Maudlin, MD Debridement Type: Debridement Severity of Tissue Pre Debridement: Fat layer exposed Level of Consciousness (Pre-procedure): Awake and Alert Pre-procedure Verification/Time Out Yes - 14:47 Taken: Start Time: 14:48 T Area Debrided (L x W): otal 4.8 (cm) x 1.8 (cm) = 8.64 (cm) Tissue and other material debrided: Non-Viable, Slough, Slough Level: Non-Viable Tissue Debridement Description: Selective/Open Wound Instrument: Curette Bleeding: Minimum Hemostasis Achieved: Pressure Procedural Pain: 0 Post Procedural Pain: 0 Response to Treatment: Procedure was tolerated well Level of Consciousness (Post- Awake and Alert procedure): Post Debridement  Measurements of Total Wound Length: (cm) 4.8 Width: (cm) 1.8 Depth: (cm) 0.1 Volume: (cm) 0.679 Character of Wound/Ulcer Post Debridement: Requires Further Debridement Severity of Tissue Post Debridement: Fat layer exposed Post Procedure Diagnosis Same as Pre-procedure Notes Scribed for Dr. Celine Ahr by Blanche East, RN Electronic Signature(s) Signed: 01/02/2023 4:01:42 PM By: Blanche East RN Signed: 01/02/2023 4:13:19 PM By: Fredirick Maudlin MD FACS Entered By: Blanche East on 01/02/2023 14:50:24 -------------------------------------------------------------------------------- HPI Details Patient Name: Date of Service: [name] 01/02/2023 2:15 PM Medical Record Number: [id number] Patient Account Number: [id number] Date of Birth/Sex: Treating RN: 1945/12/22 (77 y.o. F) Primary  Care Provider: [name] Other Clinician: Referring Provider: Treating Provider/Extender: [name] Session in Treatment: 78 History of Present Illness HPI Description: ADMISSION This is a 77 year old woman with a history of chronic venous insufficiency status post saphenous vein ablations in 2010 and 2016. 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 She presents today with an open venous ulcer on her left lower leg. It has been present for a little over 2 weeks. She saw her primary care provider who apparently swabbed the wound and grew out Pseudomonas. She just completed a course of ciprofloxacin for this. ABI was 0.91. She reports that she has had previous issues with ulcers in this same location, stemming back to a punch biopsy taken by a dermatologist many years ago. She has had several skin substitutes applied to the area that have ultimately resulted in healing on prior occasions. She has 2 small ulcers on her left medial lower leg. There is slough accumulation in both of them. The more anterior of the 2 is quite small and has some soft MAKENLY, MALLEK ([id number]) [id number] Page 3 of 11 slough on the surface, underneath which there is good granulation tissue. The more medial wound also has slough accumulation, but the underlying surface is fairly fibrotic and gritty. This is consistent with her provided history of multiple ulcers in the same location. 08/03/2022: The anterior wound is smaller today with just a little bit of slough accumulation. The more medial wound continues to be very sensitive and fibrotic with slough buildup. 08/13/2022: The anterior wound is closed. The more medial wound remains sensitive with a fairly fibrotic surface. There is more granulation tissue filling in, however, and there is less slough than on prior occasions. 08/20/2022: The anterior wound remains closed. The more medial wound has filled with granulation tissue but still has a fair amount of slough on the surface and remains fairly tender. Unfortunately, she has developed 2 small ulcers just proximal to this. The fat layer is exposed in each. She says that over the weekend, she felt a burning sensation in that location. 08/27/2022: The 2 small wounds proximal to the main wound have merged into a single site. The wounds look a little bit dry, but they are quite clean without significant slough accumulation. They remain quite tender. 09/03/2022: All of the wounds have now merged into 1 large wound. There is a strong odor coming from the wound and the surface is not particularly viable. It is extremely painful for her today. 09/10/2022: The wound is less black and purple this week but still does not look particularly viable. The surface is desiccated. The culture that I took last week returned with a polymicrobial population including Pseudomonas. I prescribed both Augmentin and levofloxacin. She has not yet picked up levofloxacin, as her pharmacy only notified her yesterday that it was ready. We have ordered a Keystone topical  compounded antibiotic, but it has not yet come. 09/17/2022: The wound surface is markedly improved today. There is still an area of grayish-looking muscle but the rest appears significantly more viable. There is still a layer of slough on the surface. She still has a couple days left of oral antibiotic therapy. She has her [employer] compounded topical antibiotic with her today. 09/24/2022: She has completed her oral antibiotic therapy. The wound surface is much cleaner today and more viable without any necrotic tissue. It is a bit desiccated, however. 10/01/2022: The moisture balance of the wound has improved. There is  a layer of yellow slough on the surface, but beneath this, the wound is more pink. 10/08/2022: The wound is smaller and cleaner today with a layer of slough present. She is having less pain. 12/18; this patient has a new wound on the right lateral ankle which apparently started after a cat scratched her. Secondarily she managed to drop a cake pan on the same area. This is very painful. The original wound was on the left posterior calf we have been using [employer] under an [employer]. The Unna boot fell down and apparently she was in for a nurse visit perhaps last week and that 1 fell down as well This patient has central venous mediated hypertension. For member correctly she has a stent placed in the left common iliac artery by Dr. Donzetta Matters some years ago [facility] Thurner syndrome] she also has had venous ablations and I believe a history of a DVT The patient has compression pumps at home but she does not use them 10/30/2022: The patient says that her wounds burned all week with the [facility] classic in place. She has a fair amount of slough and nonviable subcutaneous tissue present on the right ankle. The left posterior calf wound is cleaner than I have seen it to date. Both remain fairly tender. 11/14/2022: Both wounds have substantial slough and eschar accumulation. They remain  extremely tender. 11/21/2022: The wounds are cleaner this week, but still have slough and eschar accumulation. She continues to describe burning pain in both wounds, but upon further questioning, she actually has burning in both of her feet that radiates up her legs and includes to the wounds. 11/28/2022: Both wounds have slough and eschar accumulation, as well as adherent silver alginate that was difficult to remove. She continues to have pain out of proportion to the extent of her wounds. Her PCP did initiate Lyrica which she started taking last night. The dose is quite low, only 25 mg, but apparently there is a plan for upward titration, assuming she tolerates the medication. 12/05/2022: The wounds are both a little bit smaller today, but have significant slough accumulation, as usual. They remain exquisitely tender. She is now taking Lyrica twice a day. 12/19/2022: Both of her wounds look significantly better this week. There is slough buildup on both, but they are smaller. She seems to be getting a good response from Lyrica as she is having much less pain. 12/26/2022: The wound on her right lateral ankle is nearly closed. The wound on her left posterior calf is smaller but still has a lot of slough accumulation. They remain tender. 01/02/2023: The right lateral ankle wound is down to just a couple of millimeters. It is much less tender than on prior occasions. There is minimal slough buildup. The wound on her posterior calf continues to contract, as well. It has thick slough buildup and remains fairly tender. Electronic Signature(s) Signed: 01/02/2023 3:11:12 PM By: Fredirick Maudlin MD FACS Entered By: Fredirick Maudlin on 01/02/2023 15:11:12 -------------------------------------------------------------------------------- Physical Exam Details Patient Name: Date of Service: [name], [name] 01/02/2023 2:15 PM Medical Record Number: [id number] Patient Account Number: [id number] Date of Birth/Sex:  Treating RN: 02/13/1946 (77 y.o. F) Primary Care Provider: [name] Other Clinician: Referring Provider: Treating Provider/Extender: [name] Session in Treatment: [address] [name], [name] [id number] Page 4 of 11 Hypertensive, asymptomatic. . . . no acute distress. Respiratory Normal work of breathing on room air. Notes 01/02/2023: The right lateral ankle wound is down to just  a couple of millimeters. It is much less tender than on prior occasions. There is minimal slough buildup. The wound on her posterior calf continues to contract, as well. It has thick slough buildup and remains fairly tender. Electronic Signature(s) Signed: 01/02/2023 3:11:46 PM By: Fredirick Maudlin MD FACS Entered By: Fredirick Maudlin on 01/02/2023 15:11:46 -------------------------------------------------------------------------------- Physician Orders Details Patient Name: Date of Service: [name], [name] 01/02/2023 2:15 PM Medical Record Number: [id number] Patient Account Number: [id number] Date of Birth/Sex: Treating RN: [birth year] (77 y.o. [name], [name] Primary Care Provider: [name] Other Clinician: Referring Provider: Treating Provider/Extender: [name] Session in Treatment: 22 Verbal / Phone Orders: No Diagnosis Coding ICD-10 Coding Code Description [id number] Non-pressure chronic ulcer of other part of left lower leg with fat layer exposed L97.818 Non-pressure chronic ulcer of other part of right lower leg with other specified severity I83.893 Varicose veins of bilateral lower extremities with other complications [id number] Venous insufficiency (chronic) (peripheral) G62.9 Polyneuropathy, unspecified R60.0 Localized edema L03.115 Cellulitis of right lower limb Follow-up Appointments ppointment in 1 week. - Dr. Celine Ahr RM 3 Return A Anesthetic Wound #2 Left,Posterior Lower Leg (In  clinic) Topical Lidocaine 5% applied to wound bed - in clinic, prior to debridement (In clinic) Topical Lidocaine 4% applied to wound bed Bathing/ Shower/ Hygiene May shower with protection but do not get wound dressing(s) wet. Protect dressing(s) with water repellant cover (for example, large plastic bag) or a cast cover and may then take shower. Edema Control - Lymphedema / SCD / Other Left Lower Extremity Lymphedema Pumps. Use Lymphedema pumps on leg(s) 2-3 times a day for 45-60 minutes. If wearing any wraps or hose, do not remove them. Continue exercising as instructed. - Use x1 per day Avoid standing for long periods of time. Exercise regularly Wound Treatment Wound #2 - Lower Leg Wound Laterality: Left, Posterior Cleanser: Soap and Water 1 x Per Week/30 Days Discharge Instructions: May shower and wash wound with dial antibacterial soap and water prior to dressing change. Peri-Wound Care: Sween Lotion (Moisturizing lotion) 1 x Per Week/30 Days Discharge Instructions: Apply moisturizing lotion as directed Topical: Keystone 1 x Per Week/30 Days Discharge Instructions: Apply as directed to wound Prim Dressing: Santyl Ointment 1 x Per Week/30 Days ary Discharge Instructions: Apply nickel thick amount to wound bed as instructed Secondary Dressing: T Non-adherent Dressing, 2x3 in 1 x Per Week/30 Days elfa Discharge Instructions: Apply over primary dressing as directed. [name], [name] [id number] Page 5 of 11 Secondary Dressing: Zetuvit Plus 4x8 in 1 x Per Week/30 Days Discharge Instructions: Apply over primary dressing as directed. Secured With: 37M Medipore Public affairs consultant Surgical T 2x10 (in/yd) 1 x Per Week/30 Days ape Discharge Instructions: Secure with tape as directed. Compression Wrap: Unnaboot w/Calamine, 4x10 (in/yd) 1 x Per Week/30 Days Discharge Instructions: Apply Unnaboot as directed. Wound #3 - Ankle Wound Laterality: Right,  Lateral Cleanser: Soap and Water 1 x Per Week/30 Days Discharge Instructions: May shower and wash wound with dial antibacterial soap and water prior to dressing change. Peri-Wound Care: Sween Lotion (Moisturizing lotion) 1 x Per Week/30 Days Discharge Instructions: Apply moisturizing lotion as directed Topical: Keystone 1 x Per Week/30 Days Discharge Instructions: Apply as directed to wound Prim Dressing: Santyl Ointment 1 x Per Week/30 Days ary Discharge Instructions: Apply nickel thick amount to wound bed as instructed Secondary Dressing: T Non-adherent Dressing, 2x3 in 1 x Per Week/30 Days elfa Discharge Instructions: Apply over primary dressing as directed. Secondary Dressing: Woven Gauze  Sponge, Non-Sterile 4x4 in 1 x Per Week/30 Days Discharge Instructions: Apply over primary dressing as directed. Secured With: 39M Medipore Public affairs consultant Surgical T 2x10 (in/yd) 1 x Per Week/30 Days ape Discharge Instructions: Secure with tape as directed. Compression Wrap: Unnaboot w/Calamine, 4x10 (in/yd) 1 x Per Week/30 Days Discharge Instructions: Apply Unnaboot as directed. Electronic Signature(s) Signed: 01/02/2023 4:13:19 PM By: Fredirick Maudlin MD FACS Entered By: Fredirick Maudlin on 01/02/2023 15:12:29 -------------------------------------------------------------------------------- Problem List Details Patient Name: Date of Service: [name], [name] 01/02/2023 2:15 PM Medical Record Number: [id number] Patient Account Number: [id number] Date of Birth/Sex: Treating RN: Oct 08, 1946 (77 y.o. F) Primary Care Provider: [name] Other Clinician: Referring Provider: Treating Provider/Extender: [name] Session in Treatment: 22 Active Problems ICD-10 Encounter Code Description Active Date MDM Diagnosis [id number] Non-pressure chronic ulcer of other part of left lower leg with fat layer exposed9/22/2023 No Yes L97.818 Non-pressure chronic ulcer of other part of right  lower leg with other specified 10/22/2022 No Yes severity I83.893 Varicose veins of bilateral lower extremities with other complications 0000000 No Yes I87.2 Venous insufficiency (chronic) (peripheral) 07/27/2022 No Yes G62.9 Polyneuropathy, unspecified 07/27/2022 No Yes [name], [name] [id number] Page 6 of 11 R60.0 Localized edema 07/27/2022 No Yes L03.115 Cellulitis of right lower limb 10/22/2022 No Yes Inactive Problems Resolved Problems Electronic Signature(s) Signed: 01/02/2023 3:09:41 PM By: Fredirick Maudlin MD FACS Entered By: Fredirick Maudlin on 01/02/2023 15:09:40 -------------------------------------------------------------------------------- Progress Note Details Patient Name: Date of Service: [name], [name] 01/02/2023 2:15 PM Medical Record Number: [id number] Patient Account Number: [id number] Date of Birth/Sex: Treating RN: 12/28/1945 (77 y.o. F) Primary Care Provider: [name] Other Clinician: Referring Provider: Treating Provider/Extender: [name] Session in Treatment: 22 Subjective Chief Complaint Information obtained from Patient Patient presents for treatment of an open ulcer due to venous insufficiency History of Present Illness (HPI) ADMISSION This is a 77 year old woman with a history of chronic venous insufficiency status post saphenous vein ablations in 2010 and 2016. 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 She presents today with an open venous ulcer on her left lower leg. It has been present for a little over 2 weeks. She saw her primary care provider who apparently swabbed the wound and grew out Pseudomonas. She just completed a course of ciprofloxacin for this. ABI was 0.91. She reports that she has had previous issues with ulcers in this same location, stemming back to a punch biopsy taken by a dermatologist many years ago. She has had several skin  substitutes applied to the area that have ultimately resulted in healing on prior occasions. She has 2 small ulcers on her left medial lower leg. There is slough accumulation in both of them. The more anterior of the 2 is quite small and has some soft slough on the surface, underneath which there is good granulation tissue. The more medial wound also has slough accumulation, but the underlying surface is fairly fibrotic and gritty. This is consistent with her provided history of multiple ulcers in the same location. 08/03/2022: The anterior wound is smaller today with just a little bit of slough accumulation. The more medial wound continues to be very sensitive and fibrotic with slough buildup. 08/13/2022: The anterior wound is closed. The more medial wound remains sensitive with a fairly fibrotic surface. There is more granulation tissue filling in, however, and there is less slough than on prior occasions. 08/20/2022: The anterior wound remains closed. The more medial wound has filled with  granulation tissue but still has a fair amount of slough on the surface and remains fairly tender. Unfortunately, she has developed 2 small ulcers just proximal to this. The fat layer is exposed in each. She says that over the weekend, she felt a burning sensation in that location. 08/27/2022: The 2 small wounds proximal to the main wound have merged into a single site. The wounds look a little bit dry, but they are quite clean without significant slough accumulation. They remain quite tender. 09/03/2022: All of the wounds have now merged into 1 large wound. There is a strong odor coming from the wound and the surface is not particularly viable. It is extremely painful for her today. 09/10/2022: The wound is less black and purple this week but still does not look particularly viable. The surface is desiccated. The culture that I took last week returned with a polymicrobial population including Pseudomonas. I  prescribed both Augmentin and levofloxacin. She has not yet picked up levofloxacin, as her pharmacy only notified her yesterday that it was ready. We have ordered a Keystone topical compounded antibiotic, but it has not yet come. 09/17/2022: The wound surface is markedly improved today. There is still an area of grayish-looking muscle but the rest appears significantly more viable. There is still a layer of slough on the surface. She still has a couple days left of oral antibiotic therapy. She has her [employer] compounded topical antibiotic with her today. 09/24/2022: She has completed her oral antibiotic therapy. The wound surface is much cleaner today and more viable without any necrotic tissue. It is a bit desiccated, however. 10/01/2022: The moisture balance of the wound has improved. There is a layer of yellow slough on the surface, but beneath this, the wound is more pink. 10/08/2022: The wound is smaller and cleaner today with a layer of slough present. She is having less pain. 12/18; this patient has a new wound on the right lateral ankle which apparently started after a cat scratched her. Secondarily she managed to drop a cake pan SCHYLER, SHELL ([id number]) [id number] Page 7 of 11 on the same area. This is very painful. The original wound was on the left posterior calf we have been using [employer] under an [employer]. The Unna boot fell down and apparently she was in for a nurse visit perhaps last week and that 1 fell down as well This patient has central venous mediated hypertension. For member correctly she has a stent placed in the left common iliac artery by Dr. Donzetta Matters some years ago [facility] Thurner syndrome] she also has had venous ablations and I believe a history of a DVT The patient has compression pumps at home but she does not use them 10/30/2022: The patient says that her wounds burned all week with the [facility] classic in place. She has a fair  amount of slough and nonviable subcutaneous tissue present on the right ankle. The left posterior calf wound is cleaner than I have seen it to date. Both remain fairly tender. 11/14/2022: Both wounds have substantial slough and eschar accumulation. They remain extremely tender. 11/21/2022: The wounds are cleaner this week, but still have slough and eschar accumulation. She continues to describe burning pain in both wounds, but upon further questioning, she actually has burning in both of her feet that radiates up her legs and includes to the wounds. 11/28/2022: Both wounds have slough and eschar accumulation, as well as adherent silver alginate that was difficult to remove. She continues to  have pain out of proportion to the extent of her wounds. Her PCP did initiate Lyrica which she started taking last night. The dose is quite low, only 25 mg, but apparently there is a plan for upward titration, assuming she tolerates the medication. 12/05/2022: The wounds are both a little bit smaller today, but have significant slough accumulation, as usual. They remain exquisitely tender. She is now taking Lyrica twice a day. 12/19/2022: Both of her wounds look significantly better this week. There is slough buildup on both, but they are smaller. She seems to be getting a good response from Lyrica as she is having much less pain. 12/26/2022: The wound on her right lateral ankle is nearly closed. The wound on her left posterior calf is smaller but still has a lot of slough accumulation. They remain tender. 01/02/2023: The right lateral ankle wound is down to just a couple of millimeters. It is much less tender than on prior occasions. There is minimal slough buildup. The wound on her posterior calf continues to contract, as well. It has thick slough buildup and remains fairly tender. Patient History Information obtained from Patient. Family History Cancer - Mother,Paternal Grandparents, Diabetes - Siblings, Heart  Disease - Father, Hypertension - Father. Social History Former smoker - quit in 1967. Medical History Eyes Patient has history of Cataracts - L eye Ear/Nose/Mouth/Throat Denies history of Chronic sinus problems/congestion, Middle ear problems Hematologic/Lymphatic Denies history of Anemia, Human Immunodeficiency Virus, Lymphedema Respiratory Patient has history of Asthma Cardiovascular Patient has history of Hypertension Endocrine Denies history of Type I Diabetes, Type II Diabetes Neurologic Patient has history of Neuropathy - Feet and finger Denies history of Seizure Disorder Oncologic Patient has history of Received Chemotherapy - 2012, Received Radiation - 2012 Hospitalization/Surgery History - Lower extremity venography 2020. - Intravascular ultrasound. - peripheral vascular intervention. - melanoma 2011. 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 no acute distress. [name], [name] [id number] Page 8 of 11 Vitals Time Taken: 2:22 PM, Height: 63 in, Weight: 200 lbs, BMI: 35.4, Temperature: 98.6 F, Pulse: 83 bpm, Respiratory Rate: 18 breaths/min, Blood Pressure: 179/96 mmHg. Respiratory Normal work of breathing on room air. General Notes: 01/02/2023: The right lateral ankle wound is down to just a couple of millimeters. It is much less tender than on prior occasions. There is minimal slough buildup. The wound on her posterior calf continues to contract, as well. It has thick slough buildup and remains fairly tender. Integumentary (Hair, Skin) Wound #2 status is Open. Original cause of wound was Blister. The date acquired was: 07/06/2022. The wound has been in treatment 22  weeks. The wound is located on the Left,Posterior Lower Leg. The wound measures 4.8cm length x 1.8cm width x 0.1cm depth; 6.786cm^2 area and 0.679cm^3 volume. There is Fat Layer (Subcutaneous Tissue) exposed. There is no tunneling or undermining noted. There is a medium amount of serosanguineous drainage noted. The wound margin is flat and intact. There is small (1-33%) red granulation within the wound bed. There is a large (67-100%) amount of necrotic tissue within the wound bed including Eschar and Adherent Slough. The periwound skin appearance had no abnormalities noted for texture. The periwound skin appearance had no abnormalities noted for moisture. The periwound skin appearance had no abnormalities noted for color. Periwound temperature was noted as No Abnormality. The periwound has  tenderness on palpation. Wound #3 status is Open. Original cause of wound was Contusion/Bruise. The date acquired was: 10/19/2022. The wound has been in treatment 10 weeks. The wound is located on the Right,Lateral Ankle. The wound measures 0.3cm length x 0.4cm width x 0.1cm depth; 0.094cm^2 area and 0.009cm^3 volume. There is Fat Layer (Subcutaneous Tissue) exposed. There is no tunneling or undermining noted. There is a medium amount of serosanguineous drainage noted. The wound margin is flat and intact. There is medium (34-66%) pink granulation within the wound bed. There is a medium (34-66%) amount of necrotic tissue within the wound bed including Adherent Slough. The periwound skin appearance had no abnormalities noted for texture. The periwound skin appearance had no abnormalities noted for moisture. The periwound skin appearance did not exhibit: Erythema. Periwound temperature was noted as No Abnormality. The periwound has tenderness on palpation. Assessment Active Problems ICD-10 Non-pressure chronic ulcer of other part of left lower leg with fat layer exposed Non-pressure chronic ulcer of other part of  right lower leg with other specified severity Varicose veins of bilateral lower extremities with other complications Venous insufficiency (chronic) (peripheral) Polyneuropathy, unspecified Localized edema Cellulitis of right lower limb Procedures Wound #2 Pre-procedure diagnosis of Wound #2 is a Venous Leg Ulcer located on the Left,Posterior Lower Leg .Severity of Tissue Pre Debridement is: Fat layer exposed. There was a Selective/Open Wound Non-Viable Tissue Debridement with a total area of 8.64 sq cm performed by Fredirick Maudlin, MD. With the following instrument(s): Curette to remove Non-Viable tissue/material. Material removed includes [facility]. No specimens were taken. A time out was conducted at 14:47, prior to the start of the procedure. A Minimum amount of bleeding was controlled with Pressure. The procedure was tolerated well with a pain level of 0 throughout and a pain level of 0 following the procedure. Post Debridement Measurements: 4.8cm length x 1.8cm width x 0.1cm depth; 0.679cm^3 volume. Character of Wound/Ulcer Post Debridement requires further debridement. Severity of Tissue Post Debridement is: Fat layer exposed. Post procedure Diagnosis Wound #2: Same as Pre-Procedure General Notes: Scribed for Dr. Celine Ahr by Blanche East, RN. Pre-procedure diagnosis of Wound #2 is a Venous Leg Ulcer located on the Left,Posterior Lower Leg . There was a Double Layer Compression Therapy Procedure by Blanche East, RN. Post procedure Diagnosis Wound #2: Same as Pre-Procedure Wound #3 Pre-procedure diagnosis of Wound #3 is a Venous Leg Ulcer located on the Right,Lateral Ankle .Severity of Tissue Pre Debridement is: Fat layer exposed. There was a Selective/Open Wound Non-Viable Tissue Debridement with a total area of 0.12 sq cm performed by Fredirick Maudlin, MD. With the following instrument(s): Curette to remove Non-Viable tissue/material. Material removed includes [facility]. No specimens were  taken. A time out was conducted at 14:47, prior to the start of the procedure. A Minimum amount of bleeding was controlled with Pressure. The procedure was tolerated well with a pain level of 0 throughout and a pain level of 0 following the procedure. Post Debridement Measurements: 0.3cm length x 0.4cm width x 0.1cm depth; 0.009cm^3 volume. Character of Wound/Ulcer Post Debridement requires further debridement. Severity of Tissue Post Debridement is: Fat layer exposed. Post procedure Diagnosis Wound #3: Same as Pre-Procedure General Notes: Scribed for Dr. Celine Ahr by Blanche East, RN. Pre-procedure diagnosis of Wound #3 is a Venous Leg Ulcer located on the Right,Lateral Ankle . There was a Double Layer Compression Therapy Procedure by Blanche East, RN. Post procedure Diagnosis Wound #3: Same as Pre-Procedure Plan [name], [name] [id number] Page 9 of  11 Follow-up Appointments: Return Appointment in 1 week. - Dr. Celine Ahr RM 3 Anesthetic: Wound #2 Left,Posterior Lower Leg: (In clinic) Topical Lidocaine 5% applied to wound bed - in clinic, prior to debridement (In clinic) Topical Lidocaine 4% applied to wound bed Bathing/ Shower/ Hygiene: May shower with protection but do not get wound dressing(s) wet. Protect dressing(s) with water repellant cover (for example, large plastic bag) or a cast cover and may then take shower. Edema Control - Lymphedema / SCD / Other: Lymphedema Pumps. Use Lymphedema pumps on leg(s) 2-3 times a day for 45-60 minutes. If wearing any wraps or hose, do not remove them. Continue exercising as instructed. - Use x1 per day Avoid standing for long periods of time. Exercise regularly WOUND #2: - Lower Leg Wound Laterality: Left, Posterior Cleanser: Soap and Water 1 x Per Week/30 Days Discharge Instructions: May shower and wash wound with dial antibacterial soap and water prior to dressing change. Peri-Wound Care: Sween Lotion  (Moisturizing lotion) 1 x Per Week/30 Days Discharge Instructions: Apply moisturizing lotion as directed Topical: Keystone 1 x Per Week/30 Days Discharge Instructions: Apply as directed to wound Prim Dressing: Santyl Ointment 1 x Per Week/30 Days ary Discharge Instructions: Apply nickel thick amount to wound bed as instructed Secondary Dressing: T Non-adherent Dressing, 2x3 in 1 x Per Week/30 Days elfa Discharge Instructions: Apply over primary dressing as directed. Secondary Dressing: Zetuvit Plus 4x8 in 1 x Per Week/30 Days Discharge Instructions: Apply over primary dressing as directed. Secured With: 528M Medipore Public affairs consultant Surgical T 2x10 (in/yd) 1 x Per Week/30 Days ape Discharge Instructions: Secure with tape as directed. Com pression Wrap: Unnaboot w/Calamine, 4x10 (in/yd) 1 x Per Week/30 Days Discharge Instructions: Apply Unnaboot as directed. WOUND #3: - Ankle Wound Laterality: Right, Lateral Cleanser: Soap and Water 1 x Per Week/30 Days Discharge Instructions: May shower and wash wound with dial antibacterial soap and water prior to dressing change. Peri-Wound Care: Sween Lotion (Moisturizing lotion) 1 x Per Week/30 Days Discharge Instructions: Apply moisturizing lotion as directed Topical: Keystone 1 x Per Week/30 Days Discharge Instructions: Apply as directed to wound Prim Dressing: Santyl Ointment 1 x Per Week/30 Days ary Discharge Instructions: Apply nickel thick amount to wound bed as instructed Secondary Dressing: T Non-adherent Dressing, 2x3 in 1 x Per Week/30 Days elfa Discharge Instructions: Apply over primary dressing as directed. Secondary Dressing: Woven Gauze Sponge, Non-Sterile 4x4 in 1 x Per Week/30 Days Discharge Instructions: Apply over primary dressing as directed. Secured With: 528M Medipore Public affairs consultant Surgical T 2x10 (in/yd) 1 x Per Week/30 Days ape Discharge Instructions: Secure with tape as directed. Com pression Wrap: Unnaboot w/Calamine, 4x10 (in/yd)  1 x Per Week/30 Days Discharge Instructions: Apply Unnaboot as directed. 01/02/2023: The right lateral ankle wound is down to just a couple of millimeters. It is much less tender than on prior occasions. There is minimal slough buildup. The wound on her posterior calf continues to contract, as well. It has thick slough buildup and remains fairly tender. I used a curette to debride slough from both wound sites. We will continue to use her Keystone topical antibiotic compound mixed with Santyl to both sites. Continue bilateral Unna boots. I am hopeful that the right sided wound will be healed at her visit next week. Electronic Signature(s) Signed: 01/02/2023 3:13:15 PM By: Fredirick Maudlin MD FACS Entered By: Fredirick Maudlin on 01/02/2023 15:13:15 -------------------------------------------------------------------------------- HxROS Details Patient Name: Date of Service: Stolarz, Lamanda J. 01/02/2023 2:15 PM Medical Record Number: [id number] Patient  Account Number: [id number] Date of Birth/Sex: Treating RN: 15-Apr-1946 (77 y.o. F) Primary Care Provider: [name] Other Clinician: Referring Provider: Treating Provider/Extender: [name] Session in Treatment: 22 Information Obtained From Patient Eyes Medical History: Positive for: Cataracts - L eye TERREA, KROME ([id number]) [id number] Page 10 of 11 Ear/Nose/Mouth/Throat Medical History: Negative for: Chronic sinus problems/congestion; Middle ear problems Hematologic/Lymphatic Medical History: Negative for: Anemia; Human Immunodeficiency Virus; Lymphedema Respiratory Medical History: Positive for: Asthma Past Medical History Notes: OSA on CPAP Cardiovascular Medical History: Positive for: Hypertension Past Medical History Notes: Cronic venous insufficiency 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 Gastrointestinal Medical History: Past Medical History  Notes: liver cyst Endocrine Medical History: Negative for: Type I Diabetes; Type II Diabetes Musculoskeletal Medical History: Past Medical History Notes: arthritis Neurologic Medical History: Positive for: Neuropathy - Feet and finger Negative for: Seizure Disorder Past Medical History Notes: restless leg syndrome Oncologic Medical History: Positive for: Received Chemotherapy - 2012; Received Radiation - 2012 HBO Extended History Items Eyes: Cataracts Immunizations Pneumococcal Vaccine: Received Pneumococcal Vaccination: Yes Received Pneumococcal Vaccination On or After 60th Birthday: Yes Implantable Devices No devices added Hospitalization / Surgery History Type of Hospitalization/Surgery Lower extremity venography 2020 Intravascular ultrasound peripheral vascular intervention melanoma 2011 Family and Social History Cancer: Yes - Mother,Paternal Grandparents; Diabetes: Yes - Siblings; Heart Disease: Yes - Father; Hypertension: Yes - Father; Former smoker - quit in [address] [place] ([id number]) [id number] Page 11 of 11 Electronic Signature(s) Signed: 01/02/2023 4:13:19 PM By: Fredirick Maudlin MD FACS Entered By: Fredirick Maudlin on 01/02/2023 15:11:20 -------------------------------------------------------------------------------- SuperBill Details Patient Name: Date of Service: GINNETTE, BERARDINO 01/02/2023 Medical Record Number: [id number] Patient Account Number: [id number] Date of Birth/Sex: Treating RN: February 18, 1946 (77 y.o. F) Primary Care Provider: [name] Other Clinician: Referring Provider: Treating Provider/Extender: [name] Session in Treatment: 22 Diagnosis Coding ICD-10 Codes Code Description [id number] Non-pressure chronic ulcer of other part of left lower leg with fat layer exposed L97.818 Non-pressure chronic ulcer of other part of right lower leg with other specified severity I83.893  Varicose veins of bilateral lower extremities with other complications [id number] Venous insufficiency (chronic) (peripheral) G62.9 Polyneuropathy, unspecified R60.0 Localized edema L03.115 Cellulitis of right lower limb Facility Procedures : CPT4 Code: [id number] Description: [id number] - DEBRIDE WOUND 1ST 20 SQ CM OR < ICD-10 Diagnosis Description L97.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d L97.818 Non-pressure chronic ulcer of other part of right lower leg with other  specified s Modifier: everity Quantity: 1 Physician Procedures : CPT4 Code Description Modifier [id number] - WC PHYS LEVEL 4 - EST PT 25 ICD-10 Diagnosis Description L97.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d L97.818 Non-pressure chronic ulcer of other part of right  lower leg with other specified severity I87.2 Venous insufficiency (chronic) (peripheral) [id number] Varicose veins of bilateral lower extremities with other complications Quantity: 1 : [id number] - WC PHYS DEBR WO ANESTH 20 SQ CM ICD-10 Diagnosis Description L97.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d L97.818 Non-pressure chronic ulcer of other part of right lower leg with other  specified severity Quantity: 1 Electronic Signature(s) Signed: 01/02/2023 3:13:40 PM By: Fredirick Maudlin MD FACS Entered By: Fredirick Maudlin on 01/02/2023 15:13:40

## 2023-01-07 ENCOUNTER — Encounter (HOSPITAL_BASED_OUTPATIENT_CLINIC_OR_DEPARTMENT_OTHER): Payer: Medicare Other | Attending: General Surgery | Admitting: General Surgery

## 2023-01-07 DIAGNOSIS — L97818 Non-pressure chronic ulcer of other part of right lower leg with other specified severity: Secondary | ICD-10-CM | POA: Diagnosis not present

## 2023-01-07 DIAGNOSIS — G629 Polyneuropathy, unspecified: Secondary | ICD-10-CM | POA: Insufficient documentation

## 2023-01-07 DIAGNOSIS — Z86718 Personal history of other venous thrombosis and embolism: Secondary | ICD-10-CM | POA: Insufficient documentation

## 2023-01-07 DIAGNOSIS — Q969 Turner's syndrome, unspecified: Secondary | ICD-10-CM | POA: Diagnosis not present

## 2023-01-07 DIAGNOSIS — I10 Essential (primary) hypertension: Secondary | ICD-10-CM | POA: Diagnosis not present

## 2023-01-07 DIAGNOSIS — L03115 Cellulitis of right lower limb: Secondary | ICD-10-CM | POA: Insufficient documentation

## 2023-01-07 DIAGNOSIS — L97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d: Secondary | ICD-10-CM | POA: Insufficient documentation

## 2023-01-07 DIAGNOSIS — J45909 Unspecified asthma, uncomplicated: Secondary | ICD-10-CM | POA: Diagnosis not present

## 2023-01-07 DIAGNOSIS — G4733 Obstructive sleep apnea (adult) (pediatric): Secondary | ICD-10-CM | POA: Insufficient documentation

## 2023-01-09 ENCOUNTER — Encounter (HOSPITAL_BASED_OUTPATIENT_CLINIC_OR_DEPARTMENT_OTHER): Payer: Medicare Other | Admitting: General Surgery

## 2023-01-09 DIAGNOSIS — Q969 Turner's syndrome, unspecified: Secondary | ICD-10-CM | POA: Diagnosis not present

## 2023-01-09 DIAGNOSIS — L97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d: Secondary | ICD-10-CM | POA: Diagnosis not present

## 2023-01-09 DIAGNOSIS — G4733 Obstructive sleep apnea (adult) (pediatric): Secondary | ICD-10-CM | POA: Diagnosis not present

## 2023-01-09 DIAGNOSIS — G629 Polyneuropathy, unspecified: Secondary | ICD-10-CM | POA: Diagnosis not present

## 2023-01-09 DIAGNOSIS — I872 Venous insufficiency (chronic) (peripheral): Secondary | ICD-10-CM | POA: Diagnosis not present

## 2023-01-09 DIAGNOSIS — L97312 Non-pressure chronic ulcer of right ankle with fat layer exposed: Secondary | ICD-10-CM | POA: Diagnosis not present

## 2023-01-09 DIAGNOSIS — L97818 Non-pressure chronic ulcer of other part of right lower leg with other specified severity: Secondary | ICD-10-CM | POA: Diagnosis not present

## 2023-01-09 DIAGNOSIS — L97222 Non-pressure chronic ulcer of left calf with fat layer exposed: Secondary | ICD-10-CM | POA: Diagnosis not present

## 2023-01-09 DIAGNOSIS — L03115 Cellulitis of right lower limb: Secondary | ICD-10-CM | POA: Diagnosis not present

## 2023-01-10 NOTE — Progress Notes (Signed)
COURAGE, MOWER ([id number]) [id number] Page 1 of 3 Visit Report for 01/07/2023 Arrival Information Details Patient Name: Date of Service: [name], [name] 01/07/2023 11:30 A M Medical Record Number: [id number] Patient Account Number: [id number] Date of Birth/Sex: Treating RN: 1946-07-24 (77 y.o. F) Primary Care Arretta Toenjes: [name] Other Clinician: Referring Jerami Tammen: Treating Audri Kozub/Extender: [name] Session in Treatment: 23 Visit Information History Since Last Visit All ordered tests and consults were completed: No Patient Arrived: Ambulatory Added or deleted any medications: No Arrival Time: 11:39 Any new allergies or adverse reactions: No Accompanied By: self Had a fall or experienced change in No Transfer Assistance: None activities of daily living that may affect Patient Identification Verified: Yes risk of falls: Secondary Verification Process Completed: Yes Signs or symptoms of abuse/neglect since last visito No Patient Requires Transmission-Based Precautions: No Hospitalized since last visit: No Patient Has Alerts: No Implantable device outside of the clinic excluding No cellular tissue based products placed in the center since last visit: Pain Present Now: No Electronic Signature(s) Signed: 01/07/2023 4:57:14 PM By: Worthy Rancher Entered By: Worthy Rancher on 01/07/2023 11:39:57 -------------------------------------------------------------------------------- Compression Therapy Details Patient Name: Date of Service: [name], [name] 01/07/2023 11:30 A M Medical Record Number: [id number] Patient Account Number: [id number] Date of Birth/Sex: Treating RN: 1946-04-28 (77 y.o. Elam Dutch Primary Care Leonilda Cozby: [name] Other Clinician: Referring Corean Yoshimura: Treating Berneita Sanagustin/Extender: [name] Session in Treatment: 23 Compression Therapy Performed for Wound Assessment: Wound #2  Left,Posterior Lower Leg Performed By: Clinician [name], RN Compression Type: Rolena Infante Electronic Signature(s) Signed: 01/08/2023 5:11:49 PM By: [name] RN, [name] Entered By: [name] on 01/07/2023 11:59:33 -------------------------------------------------------------------------------- Encounter Discharge Information Details Patient Name: Date of Service: [name], [name] 01/07/2023 11:30 A M Medical Record Number: [id number] Patient Account Number: [id number] Date of Birth/Sex: Treating RN: 25-[name]-1946 (77 y.o. Elam Dutch Primary Care Hanish Laraia: [name] Other Clinician: Referring Sitara Cashwell: Treating Teniyah Seivert/Extender: [name] Session in Treatment: 23 Encounter Discharge Information Items Discharge Condition: Stable Ambulatory Status: Ambulatory Discharge Destination: Home Transportation: Private Auto Accompanied By: self Schedule Follow-up Appointment: Yes Clinical Summary of Care: Patient [name], [name] [phone].pdf Page 2 of 3 Electronic Signature(s) Signed: 01/08/2023 5:11:49 PM By: [name] RN, [name] Entered By: [name] on 01/07/2023 12:00:17 -------------------------------------------------------------------------------- Patient/Caregiver Education Details Patient Name: Date of Service: [name], [name] 3/4/2024andnbsp11:30 A M Medical Record Number: [id number] Patient Account Number: [id number] Date of Birth/Gender: Treating RN: 1946/01/29 (77 y.o. Elam Dutch Primary Care Physician: [name] Other Clinician: Referring Physician: Treating Physician/Extender: [name] Session in Treatment: 23 Education Assessment Education Provided To: Patient Education Topics Provided Venous: Methods: Explain/Verbal Responses: Reinforcements needed, State content correctly Electronic Signature(s) Signed: 01/08/2023 5:11:49 PM By:  [name] RN, [name] Entered By: [name] on 01/07/2023 12:00:02 -------------------------------------------------------------------------------- Wound Assessment Details Patient Name: Date of Service: [name], [name] 01/07/2023 11:30 A M Medical Record Number: [id number] Patient Account Number: [id number] Date of Birth/Sex: Treating RN: May 07, 1946 (77 y.o. Elam Dutch Primary Care Charnice Zwilling: [name] Other Clinician: Referring Abdulwahab Demelo: Treating Dezmen Alcock/Extender: Pixie Casino Weeks in Treatment: 23 Wound Status Wound Number: 2 Primary Etiology: Venous Leg Ulcer Wound Location: Left, Posterior Lower Leg Wound Status: Open Wounding Event: Blister Date Acquired: 07/06/2022 Weeks Of Treatment: 23 Clustered Wound: No Wound Measurements Length: (cm) 4.8 Width: (cm) 1.8 Depth: (cm) 0.1 Area: (cm) 6.786 Volume: (cm) 0.679 % Reduction in Area: 1.3% % Reduction in Volume: 1.2%  Wound Description Classification: Full Thickness Without Exposed Support Exudate Amount: Medium Exudate Type: Serosanguineous Exudate Color: red, brown Structures Periwound Skin Texture Texture Color No Abnormalities Noted: No No Abnormalities Noted: No Moisture No Abnormalities Noted: No [name], [name] [id number].pdf Page 3 of 3 Electronic Signature(s) Signed: 01/08/2023 5:11:49 PM By: [name] RN, [name] Entered By: [name] on 01/07/2023 11:59:02 -------------------------------------------------------------------------------- Vitals Details Patient Name: Date of Service: Amber Franklin. 01/07/2023 11:30 A M Medical Record Number: [id number] Patient Account Number: [id number] Date of Birth/Sex: Treating RN: 08-13-1946 (77 y.o. F) Primary Care Catalyna Reilly: [name] Other Clinician: Referring Leandria Thier: Treating Manan Olmo/Extender: [name] Session in Treatment: 23 Vital Signs Time  Taken: 11:39 Temperature (F): 97.9 Height (in): 63 Pulse (bpm): 71 Weight (lbs): 200 Respiratory Rate (breaths/min): 20 Body Mass Index (BMI): 35.4 Blood Pressure (mmHg): 129/80 Reference Range: 80 - 120 mg / dl Electronic Signature(s) Signed: 01/07/2023 4:57:14 PM By: Worthy Rancher Entered By: Worthy Rancher on 01/07/2023 11:40:21

## 2023-01-10 NOTE — Progress Notes (Signed)
[name], [name] [id number] Page 1 of 1 Visit Report for 01/07/2023 SuperBill Details Patient Name: Date of Service: [name], [name] 01/07/2023 Medical Record Number: [id number] Patient Account Number: [id number] Date of Birth/Sex: Treating RN: 07-27-1946 (77 y.o. Elam Dutch Primary Care Provider: [name] Other Clinician: Referring Provider: Treating Provider/Extender: [name] Session in Treatment: 23 Diagnosis Coding ICD-10 Codes Code Description [phone] Non-pressure chronic ulcer of other part of left lower leg with fat layer exposed L97.818 Non-pressure chronic ulcer of other part of right lower leg with other specified severity I83.893 Varicose veins of bilateral lower extremities with other complications [id number] Venous insufficiency (chronic) (peripheral) G62.9 Polyneuropathy, unspecified R60.0 Localized edema L03.115 Cellulitis of right lower limb Facility Procedures CPT4 Code Description Modifier Quantity [id number] (Facility Use Only) [id number] - Dorise Bullion BOOT LT 1 Electronic Signature(s) Signed: 01/07/2023 12:33:57 PM By: Fredirick Maudlin MD FACS Signed: 01/08/2023 5:11:49 PM By: [name] RN, [name] Entered By: [name] on 01/07/2023 12:00:28

## 2023-01-11 NOTE — Progress Notes (Signed)
CHERIDAN, NAWABI ([id number]) [id number] Page 1 of 10 Visit Report for 01/09/2023 Arrival Information Details Patient Name: Date of Service: [name], [name] 01/09/2023 2:15 PM Medical Record Number: [id number] Patient Account Number: [id number] Date of Birth/Sex: Treating RN: [birth year] (77 y.o. [name], [name] Primary Care Milad Bublitz: [name] Other Clinician: Referring Remingtyn Depaola: Treating Ryonna Cimini/Extender: [name] Session in Treatment: 23 Visit Information History Since Last Visit Added or deleted any medications: No Patient Arrived: Ambulatory Any new allergies or adverse reactions: No Arrival Time: 14:23 Had a fall or experienced change in No Accompanied By: self activities of daily living that may affect Transfer Assistance: None risk of falls: Patient Identification Verified: Yes Signs or symptoms of abuse/neglect since last visito No Secondary Verification Process Completed: Yes Hospitalized since last visit: No Patient Requires Transmission-Based Precautions: No Implantable device outside of the clinic excluding No Patient Has Alerts: No cellular tissue based products placed in the center since last visit: Has Compression in Place as Prescribed: Yes Pain Present Now: Yes Electronic Signature(s) Signed: 01/09/2023 4:54:22 PM By: Blanche East RN Entered By: Blanche East on 01/09/2023 14:23:36 -------------------------------------------------------------------------------- Compression Therapy Details Patient Name: Date of Service: [name], [name] 01/09/2023 2:15 PM Medical Record Number: [id number] Patient Account Number: [id number] Date of Birth/Sex: Treating RN: 07-Dec-1945 (77 y.o. [name] Primary Care Rhyan Wolters: [name] Other Clinician: Referring Charmagne Buhl: Treating Porsche Noguchi/Extender: [name] Session in Treatment: 23 Compression Therapy Performed for Wound Assessment:  Wound #2 Left,Posterior Lower Leg Performed By: Clinician Blanche East, RN Compression Type: Double Layer Post Procedure Diagnosis Same as Pre-procedure Notes Calamine CoFlex Electronic Signature(s) Signed: 01/09/2023 4:54:22 PM By: Blanche East RN Entered By: Blanche East on 01/09/2023 14:59:16 -------------------------------------------------------------------------------- Compression Therapy Details Patient Name: Date of Service: [name], [name] 01/09/2023 2:15 PM Medical Record Number: [id number] Patient Account Number: [id number] Date of Birth/Sex: Treating RN: [birth year] (77 y.o. [name] Primary Care Lashunta Frieden: [name] Other Clinician: Referring Twanna Resh: Treating Iyona Pehrson/Extender: [name] Session in Treatment: 23 Compression Therapy Performed for Wound Assessment: Wound #3 Right,Lateral Ankle Performed By: Clinician Blanche East, RN Compression TypeCaren Hazy Margaretmary Lombard ([id number]) [id number] Page 2 of 10 Post Procedure Diagnosis Same as Pre-procedure Notes Calamine CoFlex Electronic Signature(s) Signed: 01/09/2023 4:54:22 PM By: Blanche East RN Entered By: Blanche East on 01/09/2023 14:59:16 -------------------------------------------------------------------------------- Encounter Discharge Information Details Patient Name: Date of Service: [name], [name] 01/09/2023 2:15 PM Medical Record Number: [id number] Patient Account Number: [id number] Date of Birth/Sex: Treating RN: 01/17/1946 (77 y.o. [name] Primary Care Marquel Spoto: [name] Other Clinician: Referring Mertie Haslem: Treating Kaylise Blakeley/Extender: [name] Session in Treatment: 23 Encounter Discharge Information Items Post Procedure Vitals Discharge Condition: Stable Temperature (F): 98.1 Ambulatory Status: Ambulatory Pulse (bpm): 73 Discharge Destination: Home Respiratory Rate (breaths/min):  18 Transportation: Private Auto Blood Pressure (mmHg): 173/92 Accompanied By: self Schedule Follow-up Appointment: Yes Clinical Summary of Care: Electronic Signature(s) Signed: 01/09/2023 4:54:22 PM By: Blanche East RN Entered By: Blanche East on 01/09/2023 14:59:58 -------------------------------------------------------------------------------- Lower Extremity Assessment Details Patient Name: Date of Service: [name], [name] 01/09/2023 2:15 PM Medical Record Number: [id number] Patient Account Number: [id number] Date of Birth/Sex: Treating RN: 1946-02-26 (76 y.o. [name] Primary Care Muhammadali Ries: [name] Other Clinician: Referring Warrene Kapfer: Treating Meridian Scherger/Extender: Pixie Casino Weeks in Treatment: 23 Edema Assessment Assessed: [Left: No] [Right: No] Edema: [Left: Ye] [Right: s] Calf Left: Right: Point of Measurement: From Medial Instep 45 cm 41.4 cm Ankle Left: Right:  Point of Measurement: From Medial Instep 23 cm 23.4 cm Vascular Assessment Pulses: Dorsalis Pedis Palpable: [Left:Yes] [Right:Yes] Electronic Signature(s) Signed: 01/09/2023 4:54:22 PM By: Blanche East RN Entered By: Blanche East on 01/09/2023 14:25:09 [name] [id number] Page 3 of 10 -------------------------------------------------------------------------------- Multi Wound Chart Details Patient Name: Date of Service: [name], [name] 01/09/2023 2:15 PM Medical Record Number: [id number] Patient Account Number: [id number] Date of Birth/Sex: Treating RN: [birth year] (77 y.o. F) Primary Care Aurora Rody: [name] Other Clinician: Referring Hakeen Shipes: Treating Gilda Abboud/Extender: [name] Session in Treatment: 23 Vital Signs Height(in): 48 Pulse(bpm): 67 Weight(lbs): 200 Blood Pressure(mmHg): 173/92 Body Mass Index(BMI): 35.4 Temperature(F): 98.1 Respiratory Rate(breaths/min): 18 [2:Photos:]  [N/A:N/A] Left, Posterior Lower Leg Right, Lateral Ankle N/A Wound Location: Blister Contusion/Bruise N/A Wounding Event: Venous Leg Ulcer Venous Leg Ulcer N/A Primary Etiology: Cataracts, Asthma, Hypertension, Cataracts, Asthma, Hypertension, N/A Comorbid History: Neuropathy, Received Chemotherapy, Neuropathy, Received Chemotherapy, Received Radiation Received Radiation 07/06/2022 10/19/2022 N/A Date Acquired: 23 11 N/A Weeks of Treatment: Open Open N/A Wound Status: No No N/A Wound Recurrence: 3.8x2x0.1 0.3x0.4x0.1 N/A Measurements L x W x D (cm) 5.969 0.094 N/A A (cm) : rea 0.597 0.009 N/A Volume (cm) : 13.10% 96.50% N/A % Reduction in A rea: 13.10% 96.60% N/A % Reduction in Volume: Full Thickness Without Exposed Full Thickness Without Exposed N/A Classification: Support Structures Support Structures Medium Medium N/A Exudate A mount: Serosanguineous Serosanguineous N/A Exudate Type: red, brown red, brown N/A Exudate Color: N/A Flat and Intact N/A Wound Margin: Medium (34-66%) Medium (34-66%) N/A Granulation A mount: Pink Pink N/A Granulation Quality: Medium (34-66%) Medium (34-66%) N/A Necrotic A mount: Eschar, Adherent Slough Adherent Slough N/A Necrotic Tissue: Fat Layer (Subcutaneous Tissue): Yes Fat Layer (Subcutaneous Tissue): Yes N/A Exposed Structures: Fascia: No Fascia: No Tendon: No Tendon: No Muscle: No Muscle: No Joint: No Joint: No Bone: No Bone: No Small (1-33%) Small (1-33%) N/A Epithelialization: Debridement - Selective/Open Wound Debridement - Selective/Open Wound N/A Debridement: Pre-procedure Verification/Time Out 14:39 14:39 N/A Taken: Lidocaine 5% topical ointment Lidocaine 5% topical ointment N/A Pain Control: Necrotic/Eschar Slough N/A Tissue Debrided: Non-Viable Tissue Non-Viable Tissue N/A Level: 7.6 0.12 N/A Debridement A (sq cm): rea Curette Curette N/A Instrument: Minimum Minimum N/A Bleeding: Pressure  Pressure N/A Hemostasis A chieved: Procedure was tolerated well Procedure was tolerated well N/A Debridement Treatment Response: 3.8x2x0.1 0.3x0.4x0.1 N/A Post Debridement Measurements L x W x D (cm) 0.597 0.009 N/A Post Debridement Volume: (cm) Scarring: Yes Excoriation: Yes N/A Periwound Skin Texture: Maceration: Yes N/A Periwound Skin Moisture: Rubor: Yes Erythema: No N/A Periwound Skin Color: No Abnormality No Abnormality N/A Temperature: Yes Yes N/A Tenderness on Palpation: JACKLINE, HANLIN ([id number]) [id number] Page 4 of 10 Compression Therapy Compression Therapy N/A Procedures Performed: Debridement Debridement Treatment Notes Wound #2 (Lower Leg) Wound Laterality: Left, Posterior Cleanser Soap and Water Discharge Instruction: May shower and wash wound with dial antibacterial soap and water prior to dressing change. Peri-Wound Care Sween Lotion (Moisturizing lotion) Discharge Instruction: Apply moisturizing lotion as directed Topical Keystone Discharge Instruction: Apply as directed to wound Primary Dressing Santyl Ointment Discharge Instruction: Apply nickel thick amount to wound bed as instructed Secondary Dressing T Non-adherent Dressing, 2x3 in elfa Discharge Instruction: Apply over primary dressing as directed. Zetuvit Plus 4x8 in Discharge Instruction: Apply over primary dressing as directed. Secured With [employer] Surgical T 2x10 (in/yd) ape Discharge Instruction: Secure with tape as directed. Compression Wrap Unnaboot w/Calamine, 4x10 (in/yd) Discharge Instruction: Apply Unnaboot as directed. Compression  Stockings Add-Ons Wound #3 (Ankle) Wound Laterality: Right, Lateral Cleanser Soap and Water Discharge Instruction: May shower and wash wound with dial antibacterial soap and water prior to dressing change. Peri-Wound Care Sween Lotion (Moisturizing lotion) Discharge Instruction: Apply moisturizing  lotion as directed Topical Keystone Discharge Instruction: Apply as directed to wound Primary Dressing Santyl Ointment Discharge Instruction: Apply nickel thick amount to wound bed as instructed Secondary Dressing T Non-adherent Dressing, 2x3 in elfa Discharge Instruction: Apply over primary dressing as directed. Woven Gauze Sponge, Non-Sterile 4x4 in Discharge Instruction: Apply over primary dressing as directed. Secured With [employer] Surgical T 2x10 (in/yd) ape Discharge Instruction: Secure with tape as directed. Compression Wrap Unnaboot w/Calamine, 4x10 (in/yd) Discharge Instruction: Apply Unnaboot as directed. YAR, CLOWNEY ([id number]) [id number] Page 5 of 10 Compression Stockings Add-Ons Electronic Signature(s) Signed: 01/09/2023 3:06:18 PM By: Fredirick Maudlin MD FACS Entered By: Fredirick Maudlin on 01/09/2023 15:06:18 -------------------------------------------------------------------------------- Multi-Disciplinary Care Plan Details Patient Name: Date of Service: [name], [name] 01/09/2023 2:15 PM Medical Record Number: [id number] Patient Account Number: [id number] Date of Birth/Sex: Treating RN: Mar 09, 1946 (77 y.o. [name] Primary Care Catelyn Friel: [name] Other Clinician: Referring Jimmey Hengel: Treating Caesar Mannella/Extender: [name] Session in Treatment: 23 Active Inactive Wound/Skin Impairment Nursing Diagnoses: Impaired tissue integrity Goals: Ulcer/skin breakdown will have a volume reduction of 30% by week 4 Date Initiated: 08/03/2022 Date Inactivated: 09/10/2022 Target Resolution Date: 08/31/2022 Unmet Reason: uncontrolled edema, Goal Status: Unmet acute infection Ulcer/skin breakdown will have a volume reduction of 50% by week 8 Date Initiated: 09/10/2022 Target Resolution Date: 04/05/2023 Goal Status: Active Interventions: Assess ulceration(s) every visit Treatment  Activities: Skin care regimen initiated : 08/03/2022 Notes: Keystone ordered. 10/30 RX Levo started. 09/10/22 Electronic Signature(s) Signed: 01/09/2023 2:35:44 PM By: Blanche East RN Entered By: Blanche East on 01/09/2023 14:35:43 -------------------------------------------------------------------------------- Pain Assessment Details Patient Name: Date of Service: [name], [name] 01/09/2023 2:15 PM Medical Record Number: [id number] Patient Account Number: [id number] Date of Birth/Sex: Treating RN: 03/17/1946 (77 y.o. [name] Primary Care Lynna Zamorano: [name] Other Clinician: Referring Marcio Hoque: Treating Denny Mccree/Extender: [name] Session in Treatment: 23 Active Problems Location of Pain Severity and Description of Pain Patient Has Paino Yes Site Locations Rate the pain. PREETI, HONIG ([id number]) [id number] Page 6 of 10 Rate the pain. Current Pain Level: 7 Character of Pain Describe the Pain: Aching, Burning Pain Management and Medication Current Pain Management: Electronic Signature(s) Signed: 01/09/2023 4:54:22 PM By: Blanche East RN Entered By: Blanche East on 01/09/2023 14:24:19 -------------------------------------------------------------------------------- Patient/Caregiver Education Details Patient Name: Date of Service: [name] 3/6/2024andnbsp2:15 PM Medical Record Number: [id number] Patient Account Number: [id number] Date of Birth/Gender: Treating RN: October 13, 1946 (77 y.o. [name] Primary Care Physician: [name] Other Clinician: Referring Physician: Treating Physician/Extender: [name] Session in Treatment: 23 Education Assessment Education Provided To: Patient Education Topics Provided Wound Debridement: Methods: Explain/Verbal Responses: Reinforcements needed, State content correctly Wound/Skin Impairment: Methods: Explain/Verbal Responses:  Reinforcements needed, State content correctly Electronic Signature(s) Signed: 01/09/2023 4:54:22 PM By: Blanche East RN Entered By: Blanche East on 01/09/2023 14:36:23 -------------------------------------------------------------------------------- Wound Assessment Details Patient Name: Date of Service: [name], [name] 01/09/2023 2:15 PM Medical Record Number: [id number] Patient Account Number: [id number] Date of Birth/Sex: Treating RN: 02-28-1946 (77 y.o. [name] Primary Care Wanda Cellucci: [name] Other Clinician: Referring Barnet Benavides: Treating Keonda Dow/Extender: [name] Session in Treatment: [address], [place] ([id number]) [id number] Page 7 of 10 Wound Status Wound Number: 2  Primary Venous Leg Ulcer Etiology: Wound Location: Left, Posterior Lower Leg Wound Open Wounding Event: Blister Status: Date Acquired: 07/06/2022 Comorbid Cataracts, Asthma, Hypertension, Neuropathy, Received Weeks Of Treatment: 23 History: Chemotherapy, Received Radiation Clustered Wound: No Photos Wound Measurements Length: (cm) 3.8 Width: (cm) 2 Depth: (cm) 0.1 Area: (cm) 5.969 Volume: (cm) 0.597 % Reduction in Area: 13.1% % Reduction in Volume: 13.1% Epithelialization: Small (1-33%) Tunneling: No Undermining: No Wound Description Classification: Full Thickness Without Exposed Su Exudate Amount: Medium Exudate Type: Serosanguineous Exudate Color: red, brown pport Structures Wound Bed Granulation Amount: Medium (34-66%) Exposed Structure Granulation Quality: Pink Fascia Exposed: No Necrotic Amount: Medium (34-66%) Fat Layer (Subcutaneous Tissue) Exposed: Yes Necrotic Quality: Eschar, Adherent Slough Tendon Exposed: No Muscle Exposed: No Joint Exposed: No Bone Exposed: No Periwound Skin Texture Texture Color No Abnormalities Noted: No No Abnormalities Noted: No Scarring: Yes Rubor: Yes Moisture Temperature / Pain No  Abnormalities Noted: No Temperature: No Abnormality Tenderness on Palpation: Yes Treatment Notes Wound #2 (Lower Leg) Wound Laterality: Left, Posterior Cleanser Soap and Water Discharge Instruction: May shower and wash wound with dial antibacterial soap and water prior to dressing change. Peri-Wound Care Sween Lotion (Moisturizing lotion) Discharge Instruction: Apply moisturizing lotion as directed Topical Keystone Discharge Instruction: Apply as directed to wound Primary Dressing Santyl Ointment Discharge Instruction: Apply nickel thick amount to wound bed as instructed ELLIONA, DUFFER ([id number]) [phone] Page 8 of 10 Secondary Dressing T Non-adherent Dressing, 2x3 in elfa Discharge Instruction: Apply over primary dressing as directed. Zetuvit Plus 4x8 in Discharge Instruction: Apply over primary dressing as directed. Secured With [employer] Surgical T 2x10 (in/yd) ape Discharge Instruction: Secure with tape as directed. Compression Wrap Unnaboot w/Calamine, 4x10 (in/yd) Discharge Instruction: Apply Unnaboot as directed. Compression Stockings Add-Ons Electronic Signature(s) Signed: 01/09/2023 4:54:22 PM By: Blanche East RN Entered By: Blanche East on 01/09/2023 14:29:35 -------------------------------------------------------------------------------- Wound Assessment Details Patient Name: Date of Service: [name], [name] 01/09/2023 2:15 PM Medical Record Number: [id number] Patient Account Number: [id number] Date of Birth/Sex: Treating RN: 1946-01-31 (77 y.o. [name], [name] Primary Care [name] Holts: [name] Other Clinician: Referring Jeanae Whitmill: Treating Taraya Steward/Extender: Pixie Casino Weeks in Treatment: 23 Wound Status Wound Number: 3 Primary Venous Leg Ulcer Etiology: Wound Location: Right, Lateral Ankle Wound Open Wounding Event: Contusion/Bruise Status: Date Acquired: 10/19/2022 Comorbid  Cataracts, Asthma, Hypertension, Neuropathy, Received Weeks Of Treatment: 11 History: Chemotherapy, Received Radiation Clustered Wound: No Photos Wound Measurements Length: (cm) 0.3 Width: (cm) 0.4 Depth: (cm) 0.1 Area: (cm) 0.094 Volume: (cm) 0.009 % Reduction in Area: 96.5% % Reduction in Volume: 96.6% Epithelialization: Small (1-33%) Tunneling: No Undermining: No Wound Description Classification: Full Thickness Without Exposed Suppor Wound Margin: Flat and Intact Exudate Amount: Medium Exudate Type: Serosanguineous Exudate Color: red, brown t Structures Foul Odor After Cleansing: No Slough/Fibrino Yes Wound Bed Granulation Amount: Medium (34-66%) Exposed Structure Granulation Quality: Pink Fascia Exposed: No LATRINA, MARCONE ([id number]) [id number] Page 9 of 10 Necrotic Amount: Medium (34-66%) Fat Layer (Subcutaneous Tissue) Exposed: Yes Necrotic Quality: Adherent Slough Tendon Exposed: No Muscle Exposed: No Joint Exposed: No Bone Exposed: No Periwound Skin Texture Texture Color No Abnormalities Noted: Yes No Abnormalities Noted: No Erythema: No Moisture No Abnormalities Noted: Yes Temperature / Pain Temperature: No Abnormality Tenderness on Palpation: Yes Treatment Notes Wound #3 (Ankle) Wound Laterality: Right, Lateral Cleanser Soap and Water Discharge Instruction: May shower and wash wound with dial antibacterial soap and water prior to dressing change. Peri-Wound Care Sween Lotion (Moisturizing lotion) Discharge Instruction:  Apply moisturizing lotion as directed Topical Keystone Discharge Instruction: Apply as directed to wound Primary Dressing Santyl Ointment Discharge Instruction: Apply nickel thick amount to wound bed as instructed Secondary Dressing T Non-adherent Dressing, 2x3 in elfa Discharge Instruction: Apply over primary dressing as directed. Woven Gauze Sponge, Non-Sterile 4x4 in Discharge Instruction: Apply  over primary dressing as directed. Secured With [employer] Surgical T 2x10 (in/yd) ape Discharge Instruction: Secure with tape as directed. Compression Wrap Unnaboot w/Calamine, 4x10 (in/yd) Discharge Instruction: Apply Unnaboot as directed. Compression Stockings Add-Ons Electronic Signature(s) Signed: 01/09/2023 4:54:22 PM By: Blanche East RN Entered By: Blanche East on 01/09/2023 14:30:01 -------------------------------------------------------------------------------- Vitals Details Patient Name: Date of Service: [name] 01/09/2023 2:15 PM Medical Record Number: [id number] Patient Account Number: [id number] Date of Birth/Sex: Treating RN: April 06, 1946 (77 y.o. [name], [name] Primary Care Sally-Anne Wamble: [name] Other Clinician: Referring Else Habermann: Treating Daisa Stennis/Extender: [name] Session in Treatment: 23 Vital Signs Time Taken: 14:23 Temperature (F): 98.1 Height (in): 63 Pulse (bpm): 73 Weight (lbs): 200 Respiratory Rate (breaths/min): 18 Body Mass Index (BMI): 35.4 Blood Pressure (mmHg): 173/92 GENEVRA, SABET ([id number]) [id number] Page 10 of 10 Reference Range: 80 - 120 mg / dl Electronic Signature(s) Signed: 01/09/2023 4:54:22 PM By: Blanche East RN Entered By: Blanche East on 01/09/2023 14:23:52

## 2023-01-11 NOTE — Progress Notes (Signed)
[name], [name] [id number] Page 1 of 11 Visit Report for 01/09/2023 Chief Complaint Document Details Patient Name: Date of Service: [name], [name] 01/09/2023 2:15 PM Medical Record Number: [id number] Patient Account Number: [id number] Date of Birth/Sex: Treating RN: 1946/09/15 (77 y.o. F) Primary Care Provider: [name] Other Clinician: Referring Provider: Treating Provider/Extender: [name] Session in Treatment: 23 Information Obtained from: Patient Chief Complaint Patient presents for treatment of an open ulcer due to venous insufficiency Electronic Signature(s) Signed: 01/09/2023 3:06:33 PM By: Fredirick Maudlin MD FACS Entered By: Fredirick Maudlin on 01/09/2023 15:06:33 -------------------------------------------------------------------------------- Debridement Details Patient Name: Date of Service: [name] 01/09/2023 2:15 PM Medical Record Number: [id number] Patient Account Number: [id number] Date of Birth/Sex: Treating RN: September 04, 1946 (77 y.o. [name], [name] Primary Care Provider: [name] Other Clinician: Referring Provider: Treating Provider/Extender: [name] Session in Treatment: 23 Debridement Performed for Assessment: Wound #2 Left,Posterior Lower Leg Performed By: Physician Fredirick Maudlin, MD Debridement Type: Debridement Severity of Tissue Pre Debridement: Fat layer exposed Level of Consciousness (Pre-procedure): Awake and Alert Pre-procedure Verification/Time Out Yes - 14:39 Taken: Start Time: 14:40 Pain Control: Lidocaine 5% topical ointment T Area Debrided (L x W): otal 3.8 (cm) x 2 (cm) = 7.6 (cm) Tissue and other material debrided: Non-Viable, Eschar Level: Non-Viable Tissue Debridement Description: Selective/Open Wound Instrument: Curette Bleeding: Minimum Hemostasis Achieved: Pressure Response to Treatment: Procedure was tolerated well Level  of Consciousness (Post- Awake and Alert procedure): Post Debridement Measurements of Total Wound Length: (cm) 3.8 Width: (cm) 2 Depth: (cm) 0.1 Volume: (cm) 0.597 Character of Wound/Ulcer Post Debridement: Requires Further Debridement Severity of Tissue Post Debridement: Fat layer exposed Post Procedure Diagnosis Same as Pre-procedure Notes Scribed for Dr. Celine Ahr by Blanche East, RN Electronic Signature(s) Signed: 01/09/2023 4:54:22 PM By: Blanche East RN Signed: 01/09/2023 5:27:13 PM By: Fredirick Maudlin MD FACS Entered By: Blanche East on 01/09/2023 14:42:40 [name] [id number] Page 2 of 11 -------------------------------------------------------------------------------- Debridement Details Patient Name: Date of Service: [name], [name] 01/09/2023 2:15 PM Medical Record Number: [id number] Patient Account Number: [id number] Date of Birth/Sex: Treating RN: October 23, 1946 (77 y.o. [name], [name] Primary Care Provider: [name] Other Clinician: Referring Provider: Treating Provider/Extender: [name] Session in Treatment: 23 Debridement Performed for Assessment: Wound #3 Right,Lateral Ankle Performed By: Physician Fredirick Maudlin, MD Debridement Type: Debridement Severity of Tissue Pre Debridement: Fat layer exposed Level of Consciousness (Pre-procedure): Awake and Alert Pre-procedure Verification/Time Out Yes - 14:39 Taken: Start Time: 14:40 Pain Control: Lidocaine 5% topical ointment T Area Debrided (L x W): otal 0.3 (cm) x 0.4 (cm) = 0.12 (cm) Tissue and other material debrided: Non-Viable, Slough, Slough Level: Non-Viable Tissue Debridement Description: Selective/Open Wound Instrument: Curette Bleeding: Minimum Hemostasis Achieved: Pressure Response to Treatment: Procedure was tolerated well Level of Consciousness (Post- Awake and Alert procedure): Post Debridement Measurements of Total  Wound Length: (cm) 0.3 Width: (cm) 0.4 Depth: (cm) 0.1 Volume: (cm) 0.009 Character of Wound/Ulcer Post Debridement: Requires Further Debridement Severity of Tissue Post Debridement: Fat layer exposed Post Procedure Diagnosis Same as Pre-procedure Notes Scribed for Dr. Celine Ahr by Blanche East, RN Electronic Signature(s) Signed: 01/09/2023 4:54:22 PM By: Blanche East RN Signed: 01/09/2023 5:27:13 PM By: Fredirick Maudlin MD FACS Entered By: Blanche East on 01/09/2023 14:42:59 -------------------------------------------------------------------------------- HPI Details Patient Name: Date of Service: [name] 01/09/2023 2:15 PM Medical Record Number: [id number] Patient Account Number: [id number] Date of Birth/Sex: Treating RN: Nov 20, 1945 (77 y.o. F) Primary Care Provider: Danise Mina,  Garlon Hatchet Other Clinician: Referring Provider: Treating Provider/Extender: [name] Session in Treatment: 23 History of Present Illness HPI Description: ADMISSION This is a 77 year old woman with a history of chronic venous insufficiency status post saphenous vein ablations in 2010 and 2016. 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 She presents today with an open venous ulcer on her left lower leg. It has been present for a little over 2 weeks. She saw her primary care provider who apparently swabbed the wound and grew out Pseudomonas. She just completed a course of ciprofloxacin for this. ABI was 0.91. She reports that she has had previous issues with ulcers in this same location, stemming back to a punch biopsy taken by a dermatologist many years ago. She has had several skin substitutes applied to the area that have ultimately resulted in healing on prior occasions. She has 2 small ulcers on her left medial lower leg. There is slough accumulation in both of them. The more anterior of the 2 is quite small and has some soft slough on the surface, underneath  which there is good granulation tissue. The more medial wound also has slough accumulation, but the underlying surface is fairly fibrotic and gritty. This is consistent with her provided history of multiple ulcers in the same location. AYNA, OROARK ([id number]) [id number] Page 3 of 11 08/03/2022: The anterior wound is smaller today with just a little bit of slough accumulation. The more medial wound continues to be very sensitive and fibrotic with slough buildup. 08/13/2022: The anterior wound is closed. The more medial wound remains sensitive with a fairly fibrotic surface. There is more granulation tissue filling in, however, and there is less slough than on prior occasions. 08/20/2022: The anterior wound remains closed. The more medial wound has filled with granulation tissue but still has a fair amount of slough on the surface and remains fairly tender. Unfortunately, she has developed 2 small ulcers just proximal to this. The fat layer is exposed in each. She says that over the weekend, she felt a burning sensation in that location. 08/27/2022: The 2 small wounds proximal to the main wound have merged into a single site. The wounds look a little bit dry, but they are quite clean without significant slough accumulation. They remain quite tender. 09/03/2022: All of the wounds have now merged into 1 large wound. There is a strong odor coming from the wound and the surface is not particularly viable. It is extremely painful for her today. 09/10/2022: The wound is less black and purple this week but still does not look particularly viable. The surface is desiccated. The culture that I took last week returned with a polymicrobial population including Pseudomonas. I prescribed both Augmentin and levofloxacin. She has not yet picked up levofloxacin, as her pharmacy only notified her yesterday that it was ready. We have ordered a Keystone topical compounded antibiotic, but it  has not yet come. 09/17/2022: The wound surface is markedly improved today. There is still an area of grayish-looking muscle but the rest appears significantly more viable. There is still a layer of slough on the surface. She still has a couple days left of oral antibiotic therapy. She has her [employer] compounded topical antibiotic with her today. 09/24/2022: She has completed her oral antibiotic therapy. The wound surface is much cleaner today and more viable without any necrotic tissue. It is a bit desiccated, however. 10/01/2022: The moisture balance of the wound has improved. There is a layer of  yellow slough on the surface, but beneath this, the wound is more pink. 10/08/2022: The wound is smaller and cleaner today with a layer of slough present. She is having less pain. 12/18; this patient has a new wound on the right lateral ankle which apparently started after a cat scratched her. Secondarily she managed to drop a cake pan on the same area. This is very painful. The original wound was on the left posterior calf we have been using [employer] under an [employer]. The Unna boot fell down and apparently she was in for a nurse visit perhaps last week and that 1 fell down as well This patient has central venous mediated hypertension. For member correctly she has a stent placed in the left common iliac artery by Dr. Donzetta Matters some years ago [facility] Thurner syndrome] she also has had venous ablations and I believe a history of a DVT The patient has compression pumps at home but she does not use them 10/30/2022: The patient says that her wounds burned all week with the [facility] classic in place. She has a fair amount of slough and nonviable subcutaneous tissue present on the right ankle. The left posterior calf wound is cleaner than I have seen it to date. Both remain fairly tender. 11/14/2022: Both wounds have substantial slough and eschar accumulation. They remain extremely tender. 11/21/2022: The  wounds are cleaner this week, but still have slough and eschar accumulation. She continues to describe burning pain in both wounds, but upon further questioning, she actually has burning in both of her feet that radiates up her legs and includes to the wounds. 11/28/2022: Both wounds have slough and eschar accumulation, as well as adherent silver alginate that was difficult to remove. She continues to have pain out of proportion to the extent of her wounds. Her PCP did initiate Lyrica which she started taking last night. The dose is quite low, only 25 mg, but apparently there is a plan for upward titration, assuming she tolerates the medication. 12/05/2022: The wounds are both a little bit smaller today, but have significant slough accumulation, as usual. They remain exquisitely tender. She is now taking Lyrica twice a day. 12/19/2022: Both of her wounds look significantly better this week. There is slough buildup on both, but they are smaller. She seems to be getting a good response from Lyrica as she is having much less pain. 12/26/2022: The wound on her right lateral ankle is nearly closed. The wound on her left posterior calf is smaller but still has a lot of slough accumulation. They remain tender. 01/02/2023: The right lateral ankle wound is down to just a couple of millimeters. It is much less tender than on prior occasions. There is minimal slough buildup. The wound on her posterior calf continues to contract, as well. It has thick slough buildup and remains fairly tender. 01/09/2023: The right lateral ankle wound is nearly closed. He remains tender. There is just a little eschar on the surface. The wound on her posterior calf has improved quite a bit. There is still slough on the surface, but the more proximal area has epithelialized substantially and there is no longer any gray discoloration to her tissue. Electronic Signature(s) Signed: 01/09/2023 3:07:23 PM By: Fredirick Maudlin MD FACS Entered  By: Fredirick Maudlin on 01/09/2023 15:07:23 -------------------------------------------------------------------------------- Physical Exam Details Patient Name: Date of Service: [name], [name] 01/09/2023 2:15 PM Medical Record Number: [id number] Patient Account Number: [id number] Date of Birth/Sex: Treating RN: 09-16-1946 (77 y.o. F) Primary  Care Provider: [name] Other Clinician: Referring Provider: Treating Provider/Extender: [name] Session in Treatment: [address] [place] ([id number]) [id number] Page 4 of 11 Constitutional Hypertensive, asymptomatic. . . . no acute distress. Respiratory Normal work of breathing on room air. Notes 01/09/2023: The right lateral ankle wound is nearly closed. It remains tender. There is just a little eschar on the surface. The wound on her posterior calf has improved quite a bit. There is still slough on the surface, but the more proximal area has epithelialized substantially and there is no longer any gray discoloration to her tissue. Electronic Signature(s) Signed: 01/09/2023 3:08:30 PM By: Fredirick Maudlin MD FACS Entered By: Fredirick Maudlin on 01/09/2023 15:08:29 -------------------------------------------------------------------------------- Physician Orders Details Patient Name: Date of Service: [name], [name] 01/09/2023 2:15 PM Medical Record Number: [id number] Patient Account Number: [id number] Date of Birth/Sex: Treating RN: 08/22/1946 (77 y.o. [name], [name] Primary Care Provider: [name] Other Clinician: Referring Provider: Treating Provider/Extender: [name] Session in Treatment: 23 Verbal / Phone Orders: No Diagnosis Coding ICD-10 Coding Code Description [phone] Non-pressure chronic ulcer of other part of left lower leg with fat layer exposed L97.818 Non-pressure chronic ulcer of other part of right lower leg with other specified  severity I83.893 Varicose veins of bilateral lower extremities with other complications [id number] Venous insufficiency (chronic) (peripheral) G62.9 Polyneuropathy, unspecified R60.0 Localized edema L03.115 Cellulitis of right lower limb Follow-up Appointments ppointment in 1 week. - Dr. Celine Ahr RM 3 Return A Anesthetic Wound #2 Left,Posterior Lower Leg (In clinic) Topical Lidocaine 5% applied to wound bed - in clinic, prior to debridement (In clinic) Topical Lidocaine 4% applied to wound bed Bathing/ Shower/ Hygiene May shower with protection but do not get wound dressing(s) wet. Protect dressing(s) with water repellant cover (for example, large plastic bag) or a cast cover and may then take shower. Edema Control - Lymphedema / SCD / Other Left Lower Extremity Lymphedema Pumps. Use Lymphedema pumps on leg(s) 2-3 times a day for 45-60 minutes. If wearing any wraps or hose, do not remove them. Continue exercising as instructed. - Use x1 per day Avoid standing for long periods of time. Exercise regularly Wound Treatment Wound #2 - Lower Leg Wound Laterality: Left, Posterior Cleanser: Soap and Water 1 x Per Week/30 Days Discharge Instructions: May shower and wash wound with dial antibacterial soap and water prior to dressing change. Peri-Wound Care: Sween Lotion (Moisturizing lotion) 1 x Per Week/30 Days Discharge Instructions: Apply moisturizing lotion as directed Topical: Keystone 1 x Per Week/30 Days Discharge Instructions: Apply as directed to wound Prim Dressing: Santyl Ointment 1 x Per Week/30 Days ary Discharge Instructions: Apply nickel thick amount to wound bed as instructed Secondary Dressing: T Non-adherent Dressing, 2x3 in elfa 1 x Per Week/30 Days [name], [name] [id number] Page 5 of 11 Discharge Instructions: Apply over primary dressing as directed. Secondary Dressing: Zetuvit Plus 4x8 in 1 x Per Week/30 Days Discharge Instructions:  Apply over primary dressing as directed. Secured With: 36M Medipore Public affairs consultant Surgical T 2x10 (in/yd) 1 x Per Week/30 Days ape Discharge Instructions: Secure with tape as directed. Compression Wrap: Unnaboot w/Calamine, 4x10 (in/yd) 1 x Per Week/30 Days Discharge Instructions: Apply Unnaboot as directed. Wound #3 - Ankle Wound Laterality: Right, Lateral Cleanser: Soap and Water 1 x Per Week/30 Days Discharge Instructions: May shower and wash wound with dial antibacterial soap and water prior to dressing change. Peri-Wound Care: Sween Lotion (Moisturizing lotion) 1 x Per Week/30 Days Discharge Instructions:  Apply moisturizing lotion as directed Topical: Keystone 1 x Per Week/30 Days Discharge Instructions: Apply as directed to wound Prim Dressing: Santyl Ointment 1 x Per Week/30 Days ary Discharge Instructions: Apply nickel thick amount to wound bed as instructed Secondary Dressing: T Non-adherent Dressing, 2x3 in 1 x Per Week/30 Days elfa Discharge Instructions: Apply over primary dressing as directed. Secondary Dressing: Woven Gauze Sponge, Non-Sterile 4x4 in 1 x Per Week/30 Days Discharge Instructions: Apply over primary dressing as directed. Secured With: 27M Medipore Public affairs consultant Surgical T 2x10 (in/yd) 1 x Per Week/30 Days ape Discharge Instructions: Secure with tape as directed. Compression Wrap: Unnaboot w/Calamine, 4x10 (in/yd) 1 x Per Week/30 Days Discharge Instructions: Apply Unnaboot as directed. Electronic Signature(s) Signed: 01/09/2023 5:27:13 PM By: Fredirick Maudlin MD FACS Previous Signature: 01/09/2023 2:35:38 PM Version By: Blanche East RN Entered By: Fredirick Maudlin on 01/09/2023 15:08:56 -------------------------------------------------------------------------------- Problem List Details Patient Name: Date of Service: [name], [name] 01/09/2023 2:15 PM Medical Record Number: [id number] Patient Account Number: [id number] Date of Birth/Sex: Treating RN: [name] 13, 1946 (77  y.o. F) Primary Care Provider: [name] Other Clinician: Referring Provider: Treating Provider/Extender: [name] Session in Treatment: 23 Active Problems ICD-10 Encounter Code Description Active Date MDM Diagnosis L97.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d9/22/2023 No Yes L97.818 Non-pressure chronic ulcer of other part of right lower leg with other specified 10/22/2022 No Yes severity I83.893 Varicose veins of bilateral lower extremities with other complications 0000000 No Yes I87.2 Venous insufficiency (chronic) (peripheral) 07/27/2022 No Yes DOY, GARA ([id number]) [id number] Page 6 of 11 G62.9 Polyneuropathy, unspecified 07/27/2022 No Yes R60.0 Localized edema 07/27/2022 No Yes L03.115 Cellulitis of right lower limb 10/22/2022 No Yes Inactive Problems Resolved Problems Electronic Signature(s) Signed: 01/09/2023 3:05:53 PM By: Fredirick Maudlin MD FACS Entered By: Fredirick Maudlin on 01/09/2023 15:05:53 -------------------------------------------------------------------------------- Progress Note Details Patient Name: Date of Service: [name] 01/09/2023 2:15 PM Medical Record Number: [id number] Patient Account Number: [id number] Date of Birth/Sex: Treating RN: [birth year] (77 y.o. F) Primary Care Provider: [name] Other Clinician: Referring Provider: Treating Provider/Extender: [name] Session in Treatment: 23 Subjective Chief Complaint Information obtained from Patient Patient presents for treatment of an open ulcer due to venous insufficiency History of Present Illness (HPI) ADMISSION This is a 77 year old woman with a history of chronic venous insufficiency status post saphenous vein ablations in 2010 and 2016. 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 She presents today with an open venous  ulcer on her left lower leg. It has been present for a little over 2 weeks. She saw her primary care provider who apparently swabbed the wound and grew out Pseudomonas. She just completed a course of ciprofloxacin for this. ABI was 0.91. She reports that she has had previous issues with ulcers in this same location, stemming back to a punch biopsy taken by a dermatologist many years ago. She has had several skin substitutes applied to the area that have ultimately resulted in healing on prior occasions. She has 2 small ulcers on her left medial lower leg. There is slough accumulation in both of them. The more anterior of the 2 is quite small and has some soft slough on the surface, underneath which there is good granulation tissue. The more medial wound also has slough accumulation, but the underlying surface is fairly fibrotic and gritty. This is consistent with her provided history of multiple ulcers in the same location. 08/03/2022:  The anterior wound is smaller today with just a little bit of slough accumulation. The more medial wound continues to be very sensitive and fibrotic with slough buildup. 08/13/2022: The anterior wound is closed. The more medial wound remains sensitive with a fairly fibrotic surface. There is more granulation tissue filling in, however, and there is less slough than on prior occasions. 08/20/2022: The anterior wound remains closed. The more medial wound has filled with granulation tissue but still has a fair amount of slough on the surface and remains fairly tender. Unfortunately, she has developed 2 small ulcers just proximal to this. The fat layer is exposed in each. She says that over the weekend, she felt a burning sensation in that location. 08/27/2022: The 2 small wounds proximal to the main wound have merged into a single site. The wounds look a little bit dry, but they are quite clean without significant slough accumulation. They remain quite tender. 09/03/2022:  All of the wounds have now merged into 1 large wound. There is a strong odor coming from the wound and the surface is not particularly viable. It is extremely painful for her today. 09/10/2022: The wound is less black and purple this week but still does not look particularly viable. The surface is desiccated. The culture that I took last week returned with a polymicrobial population including Pseudomonas. I prescribed both Augmentin and levofloxacin. She has not yet picked up levofloxacin, as her pharmacy only notified her yesterday that it was ready. We have ordered a Keystone topical compounded antibiotic, but it has not yet come. 09/17/2022: The wound surface is markedly improved today. There is still an area of grayish-looking muscle but the rest appears significantly more viable. There is still a layer of slough on the surface. She still has a couple days left of oral antibiotic therapy. She has her [employer] compounded topical antibiotic with her today. 09/24/2022: She has completed her oral antibiotic therapy. The wound surface is much cleaner today and more viable without any necrotic tissue. It is a bit desiccated, however. 10/01/2022: The moisture balance of the wound has improved. There is a layer of yellow slough on the surface, but beneath this, the wound is more pink. [name], [name] [id number] Page 7 of 11 10/08/2022: The wound is smaller and cleaner today with a layer of slough present. She is having less pain. 12/18; this patient has a new wound on the right lateral ankle which apparently started after a cat scratched her. Secondarily she managed to drop a cake pan on the same area. This is very painful. The original wound was on the left posterior calf we have been using [employer] under an [employer]. The Unna boot fell down and apparently she was in for a nurse visit perhaps last week and that 1 fell down as well This patient has central  venous mediated hypertension. For member correctly she has a stent placed in the left common iliac artery by Dr. Donzetta Matters some years ago [facility] Thurner syndrome] she also has had venous ablations and I believe a history of a DVT The patient has compression pumps at home but she does not use them 10/30/2022: The patient says that her wounds burned all week with the [facility] classic in place. She has a fair amount of slough and nonviable subcutaneous tissue present on the right ankle. The left posterior calf wound is cleaner than I have seen it to date. Both remain fairly tender. 11/14/2022: Both wounds have substantial slough  and eschar accumulation. They remain extremely tender. 11/21/2022: The wounds are cleaner this week, but still have slough and eschar accumulation. She continues to describe burning pain in both wounds, but upon further questioning, she actually has burning in both of her feet that radiates up her legs and includes to the wounds. 11/28/2022: Both wounds have slough and eschar accumulation, as well as adherent silver alginate that was difficult to remove. She continues to have pain out of proportion to the extent of her wounds. Her PCP did initiate Lyrica which she started taking last night. The dose is quite low, only 25 mg, but apparently there is a plan for upward titration, assuming she tolerates the medication. 12/05/2022: The wounds are both a little bit smaller today, but have significant slough accumulation, as usual. They remain exquisitely tender. She is now taking Lyrica twice a day. 12/19/2022: Both of her wounds look significantly better this week. There is slough buildup on both, but they are smaller. She seems to be getting a good response from Lyrica as she is having much less pain. 12/26/2022: The wound on her right lateral ankle is nearly closed. The wound on her left posterior calf is smaller but still has a lot of slough accumulation. They remain tender. 01/02/2023:  The right lateral ankle wound is down to just a couple of millimeters. It is much less tender than on prior occasions. There is minimal slough buildup. The wound on her posterior calf continues to contract, as well. It has thick slough buildup and remains fairly tender. 01/09/2023: The right lateral ankle wound is nearly closed. He remains tender. There is just a little eschar on the surface. The wound on her posterior calf has improved quite a bit. There is still slough on the surface, but the more proximal area has epithelialized substantially and there is no longer any gray discoloration to her tissue. Patient History Information obtained from Patient. Family History Cancer - Mother,Paternal Grandparents, Diabetes - Siblings, Heart Disease - Father, Hypertension - Father. Social History Former smoker - quit in 1967. Medical History Eyes Patient has history of Cataracts - L eye Ear/Nose/Mouth/Throat Denies history of Chronic sinus problems/congestion, Middle ear problems Hematologic/Lymphatic Denies history of Anemia, Human Immunodeficiency Virus, Lymphedema Respiratory Patient has history of Asthma Cardiovascular Patient has history of Hypertension Endocrine Denies history of Type I Diabetes, Type II Diabetes Neurologic Patient has history of Neuropathy - Feet and finger Denies history of Seizure Disorder Oncologic Patient has history of Received Chemotherapy - 2012, Received Radiation - 2012 Hospitalization/Surgery History - Lower extremity venography 2020. - Intravascular ultrasound. - peripheral vascular intervention. - melanoma 2011. 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 no acute distress. Vitals Time Taken: 2:23 PM, Height: 63 in, Weight: 200 lbs, BMI: 35.4, Temperature: 98.1 F, Pulse: 73 bpm, Respiratory Rate: 18 breaths/min, Blood Pressure: 173/92 mmHg. Respiratory Normal work of breathing on room air. General Notes: 01/09/2023: The right lateral ankle wound is nearly closed. It remains tender. There is just a little eschar on the surface. The wound on her posterior calf has improved quite a bit. There is still slough on the surface, but the more proximal area has epithelialized substantially and there is no longer any gray discoloration to her tissue. Integumentary (Hair, Skin) Wound #2 status is  Open. Original cause of wound was Blister. The date acquired was: 07/06/2022. The wound has been in treatment 23 weeks. The wound is located on the Left,Posterior Lower Leg. The wound measures 3.8cm length x 2cm width x 0.1cm depth; 5.969cm^2 area and 0.597cm^3 volume. There is Fat Layer (Subcutaneous Tissue) exposed. There is no tunneling or undermining noted. There is a medium amount of serosanguineous drainage noted. There is medium (34-66%) pink granulation within the wound bed. There is a medium (34-66%) amount of necrotic tissue within the wound bed including Eschar and Adherent Slough. The periwound skin appearance exhibited: Scarring, Rubor. Periwound temperature was noted as No Abnormality. The periwound has tenderness on palpation. Wound #3 status is Open. Original cause of wound was Contusion/Bruise. The date acquired was: 10/19/2022. The wound has been in treatment 11 weeks. The wound is located on the Right,Lateral Ankle. The wound measures 0.3cm length x 0.4cm width x 0.1cm depth; 0.094cm^2 area and 0.009cm^3 volume. There is Fat Layer (Subcutaneous Tissue) exposed. There is no tunneling or undermining noted. There is a medium amount of serosanguineous drainage noted. The wound margin is flat and  intact. There is medium (34-66%) pink granulation within the wound bed. There is a medium (34-66%) amount of necrotic tissue within the wound bed including Adherent Slough. The periwound skin appearance had no abnormalities noted for texture. The periwound skin appearance had no abnormalities noted for moisture. The periwound skin appearance did not exhibit: Erythema. Periwound temperature was noted as No Abnormality. The periwound has tenderness on palpation. 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 There was a Selective/Open Wound Non-Viable Tissue Debridement with a total area of 7.6 sq cm performed by Fredirick Maudlin, MD. With the following instrument(s): Curette to remove Non-Viable tissue/material. Material removed includes Eschar after achieving pain control using Lidocaine 5% topical ointment. No specimens were taken. A time out was conducted at 14:39, prior to the start of the procedure. A Minimum amount of bleeding was controlled with Pressure. The procedure was tolerated well. Post Debridement Measurements: 3.8cm length x 2cm width x 0.1cm depth; 0.597cm^3 volume. Character of Wound/Ulcer Post Debridement requires further debridement. Severity of Tissue Post Debridement is: Fat layer exposed. Post procedure Diagnosis Wound #2: Same as Pre-Procedure General Notes: Scribed for Dr. Celine Ahr by Blanche East, RN. Pre-procedure diagnosis of Wound #2 is a Venous Leg  Ulcer located on the Left,Posterior Lower Leg . There was a Double Layer Compression Therapy Procedure by Blanche East, RN. Post procedure Diagnosis Wound #2: Same as Pre-Procedure Notes: Calamine CoFlex. Wound #3 Pre-procedure diagnosis of Wound #3 is a Venous Leg Ulcer located on the Right,Lateral Ankle .Severity of Tissue Pre Debridement is: Fat layer exposed. There was a Selective/Open Wound Non-Viable Tissue Debridement with a total area of 0.12 sq cm performed by Fredirick Maudlin, MD. With the following instrument(s): Curette to remove Non-Viable tissue/material. Material removed includes [facility] after achieving pain control using Lidocaine 5% topical ointment. No specimens were taken. A time out was conducted at 14:39, prior to the start of the procedure. A Minimum amount of bleeding  was controlled with Pressure. The procedure was tolerated well. Post Debridement Measurements: 0.3cm length x 0.4cm width x 0.1cm depth; 0.009cm^3 volume. Character of Wound/Ulcer Post Debridement requires further debridement. Severity of Tissue Post Debridement is: Fat layer exposed. Post procedure Diagnosis Wound #3: Same as Pre-Procedure General Notes: Scribed for Dr. Celine Ahr by Blanche East, RN. Pre-procedure diagnosis of Wound #3 is a Venous Leg Ulcer located on the Right,Lateral Ankle . There was a Double Layer Compression Therapy Procedure by Blanche East, RN. Post procedure Diagnosis Wound #3: Same as Pre-Procedure Notes: Calamine CoFlex. [name], [name] [id number] Page 9 of 11 Plan Follow-up Appointments: Return Appointment in 1 week. - Dr. Celine Ahr RM 3 Anesthetic: Wound #2 Left,Posterior Lower Leg: (In clinic) Topical Lidocaine 5% applied to wound bed - in clinic, prior to debridement (In clinic) Topical Lidocaine 4% applied to wound bed Bathing/ Shower/ Hygiene: May shower with protection but do not get wound dressing(s) wet. Protect dressing(s) with  water repellant cover (for example, large plastic bag) or a cast cover and may then take shower. Edema Control - Lymphedema / SCD / Other: Lymphedema Pumps. Use Lymphedema pumps on leg(s) 2-3 times a day for 45-60 minutes. If wearing any wraps or hose, do not remove them. Continue exercising as instructed. - Use x1 per day Avoid standing for long periods of time. Exercise regularly WOUND #2: - Lower Leg Wound Laterality: Left, Posterior Cleanser: Soap and Water 1 x Per Week/30 Days Discharge Instructions: May shower and wash wound with dial antibacterial soap and water prior to dressing change. Peri-Wound Care: Sween Lotion (Moisturizing lotion) 1 x Per Week/30 Days Discharge Instructions: Apply moisturizing lotion as directed Topical: Keystone 1 x Per Week/30 Days Discharge Instructions: Apply as directed to wound Prim Dressing: Santyl Ointment 1 x Per Week/30 Days ary Discharge Instructions: Apply nickel thick amount to wound bed as instructed Secondary Dressing: T Non-adherent Dressing, 2x3 in 1 x Per Week/30 Days elfa Discharge Instructions: Apply over primary dressing as directed. Secondary Dressing: Zetuvit Plus 4x8 in 1 x Per Week/30 Days Discharge Instructions: Apply over primary dressing as directed. Secured With: 21M Medipore Public affairs consultant Surgical T 2x10 (in/yd) 1 x Per Week/30 Days ape Discharge Instructions: Secure with tape as directed. Com pression Wrap: Unnaboot w/Calamine, 4x10 (in/yd) 1 x Per Week/30 Days Discharge Instructions: Apply Unnaboot as directed. WOUND #3: - Ankle Wound Laterality: Right, Lateral Cleanser: Soap and Water 1 x Per Week/30 Days Discharge Instructions: May shower and wash wound with dial antibacterial soap and water prior to dressing change. Peri-Wound Care: Sween Lotion (Moisturizing lotion) 1 x Per Week/30 Days Discharge Instructions: Apply moisturizing lotion as directed Topical: Keystone 1 x Per Week/30 Days Discharge Instructions: Apply as  directed to wound Prim Dressing: Santyl Ointment 1 x Per Week/30 Days ary Discharge Instructions: Apply nickel thick amount to wound bed as instructed Secondary Dressing: T Non-adherent Dressing, 2x3 in 1 x Per Week/30 Days elfa Discharge Instructions: Apply over primary dressing as directed. Secondary Dressing: Woven Gauze Sponge, Non-Sterile 4x4 in 1 x Per Week/30 Days Discharge Instructions: Apply over primary dressing as directed. Secured With: 21M Medipore Public affairs consultant Surgical T 2x10 (in/yd) 1 x Per Week/30 Days ape Discharge Instructions: Secure with tape as directed. Com pression Wrap: Unnaboot w/Calamine, 4x10 (in/yd) 1 x Per Week/30 Days Discharge Instructions: Apply Unnaboot as directed. 01/09/2023: The right lateral ankle wound is nearly closed. It remains tender. There is just a little eschar on the surface. The wound on her posterior calf  has improved quite a bit. There is still slough on the surface, but the more proximal area has epithelialized substantially and there is no longer any gray discoloration to her tissue. I used a curette to debride eschar from the right lateral ankle wound and slough from the left posterior calf wound. We will continue Santyl to both sites and Keystone topical antibiotic compound mixed with the Santyl to her left posterior calf wound. Continue bilateral Unna boot with calamine. Follow-up in 1 week. Electronic Signature(s) Signed: 01/09/2023 3:10:17 PM By: Fredirick Maudlin MD FACS Entered By: Fredirick Maudlin on 01/09/2023 15:10:17 -------------------------------------------------------------------------------- Amber Details Patient Name: Date of Service: [name], [name] 01/09/2023 2:15 PM Medical Record Number: [id number] Patient Account Number: [id number] Date of Birth/Sex: Treating RN: July 21, 1946 (77 y.o. F) Primary Care Provider: [name] Other Clinician: Referring Provider: Treating Provider/Extender: [name] Session in Treatment: [address] [place] ([id number]) [id number] Page 10 of 11 Information Obtained From Patient Eyes Medical History: Positive for: Cataracts - L eye Ear/Nose/Mouth/Throat Medical History: Negative for: Chronic sinus problems/congestion; Middle ear problems Hematologic/Lymphatic Medical History: Negative for: Anemia; Human Immunodeficiency Virus; Lymphedema Respiratory Medical History: Positive for: Asthma Past Medical History Notes: OSA on CPAP Cardiovascular Medical History: Positive for: Hypertension Past Medical History Notes: Cronic venous insufficiency 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 Gastrointestinal Medical History: Past Medical History Notes: liver cyst Endocrine Medical History: Negative for: Type I Diabetes; Type II Diabetes Musculoskeletal Medical History: Past Medical History Notes: arthritis Neurologic Medical History: Positive for: Neuropathy - Feet and finger Negative for: Seizure Disorder Past Medical History Notes: restless leg syndrome Oncologic Medical History: Positive for: Received Chemotherapy - 2012; Received Radiation - 2012 HBO Extended History Items Eyes: Cataracts Immunizations Pneumococcal Vaccine: Received Pneumococcal Vaccination: Yes Received Pneumococcal Vaccination On or After 60th Birthday: Yes Implantable Devices No devices added Hospitalization / Surgery History Type of Hospitalization/Surgery TALONDA, KREISCHER ([id number]) [id number] Page 11 of 11 Lower extremity venography 2020 Intravascular ultrasound peripheral vascular intervention melanoma 2011 Family and Social History Cancer: Yes - Mother,Paternal Grandparents; Diabetes: Yes - Siblings; Heart Disease: Yes - Father; Hypertension: Yes - Father; Former smoker - quit in [employer]) Signed: 01/09/2023 5:27:13 PM By: Fredirick Maudlin MD  FACS Entered By: Fredirick Maudlin on 01/09/2023 15:07:47 -------------------------------------------------------------------------------- SuperBill Details Patient Name: Date of Service: KADAISHA, MCNUTT 01/09/2023 Medical Record Number: [id number] Patient Account Number: [id number] Date of Birth/Sex: Treating RN: [birth year] (77 y.o. F) Primary Care Provider: [name] Other Clinician: Referring Provider: Treating Provider/Extender: [name] Session in Treatment: 23 Diagnosis Coding ICD-10 Codes Code Description [phone] Non-pressure chronic ulcer of other part of left lower leg with fat layer exposed L97.818 Non-pressure chronic ulcer of other part of right lower leg with other specified severity I83.893 Varicose veins of bilateral lower extremities with other complications [id number] Venous insufficiency (chronic) (peripheral) G62.9 Polyneuropathy, unspecified R60.0 Localized edema L03.115 Cellulitis of right lower limb Facility Procedures : CPT4 Code: [id number] Description: [id number] - DEBRIDE WOUND 1ST 20 SQ CM OR < ICD-10 Diagnosis Description L97.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d L97.818 Non-pressure chronic ulcer of other part of right lower leg with other  specified s Modifier: everity Quantity: 1 Physician Procedures : CPT4 Code Description Modifier [id number] - WC PHYS LEVEL 4 - EST PT 25 ICD-10 Diagnosis Description L97.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d L97.818 Non-pressure chronic ulcer of  other part of right  lower leg with other specified severity I87.2 Venous insufficiency (chronic) (peripheral) G62.9 Polyneuropathy, unspecified Quantity: 1 : [id number] - WC PHYS DEBR WO ANESTH 20 SQ CM ICD-10 Diagnosis Description L97.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d L97.818 Non-pressure chronic ulcer of  other part of right lower leg with other  specified severity Quantity: 1 Electronic Signature(s) Signed: 01/09/2023 3:10:48 PM By: Fredirick Maudlin MD FACS Entered By: Fredirick Maudlin on 01/09/2023 15:10:47

## 2023-01-16 ENCOUNTER — Encounter (HOSPITAL_BASED_OUTPATIENT_CLINIC_OR_DEPARTMENT_OTHER): Payer: Medicare Other | Admitting: Internal Medicine

## 2023-01-16 DIAGNOSIS — L97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d: Secondary | ICD-10-CM

## 2023-01-16 DIAGNOSIS — G4733 Obstructive sleep apnea (adult) (pediatric): Secondary | ICD-10-CM | POA: Diagnosis not present

## 2023-01-16 DIAGNOSIS — Q969 Turner's syndrome, unspecified: Secondary | ICD-10-CM | POA: Diagnosis not present

## 2023-01-16 DIAGNOSIS — I872 Venous insufficiency (chronic) (peripheral): Secondary | ICD-10-CM

## 2023-01-16 DIAGNOSIS — L97818 Non-pressure chronic ulcer of other part of right lower leg with other specified severity: Secondary | ICD-10-CM

## 2023-01-16 DIAGNOSIS — L03115 Cellulitis of right lower limb: Secondary | ICD-10-CM | POA: Diagnosis not present

## 2023-01-16 DIAGNOSIS — G629 Polyneuropathy, unspecified: Secondary | ICD-10-CM | POA: Diagnosis not present

## 2023-01-18 NOTE — Progress Notes (Signed)
Amber, [name] ([id number]) [id number] Page 1 of 11 Visit Report for 01/16/2023 Chief Complaint Document Details Patient Name: Date of Service: [name], [name] 01/16/2023 2:15 PM Medical Record Number: [id number] Patient Account Number: [id number] Date of Birth/Sex: Treating RN: December 22, 1945 (77 y.o. F) Primary Care Provider: [name] Other Clinician: Referring Provider: Treating Provider/Extender: Hattie Perch in Treatment: 24 Information Obtained from: Patient Chief Complaint Patient presents for treatment of an open ulcer due to venous insufficiency Electronic Signature(s) Signed: 01/17/2023 3:34:03 PM By: Kalman Shan DO Entered By: Kalman Shan on 01/16/2023 16:14:05 -------------------------------------------------------------------------------- Debridement Details Patient Name: Date of Service: [name] 01/16/2023 2:15 PM Medical Record Number: [id number] Patient Account Number: [id number] Date of Birth/Sex: Treating RN: 1945-11-27 (77 y.o. [name] Primary Care Provider: [name] Other Clinician: Referring Provider: Treating Provider/Extender: Hattie Perch in Treatment: 24 Debridement Performed for Assessment: Wound #2 Left,Posterior Lower Leg Performed By: Physician Kalman Shan, DO Debridement Type: Debridement Severity of Tissue Pre Debridement: Fat layer exposed Level of Consciousness (Pre-procedure): Awake and Alert Pre-procedure Verification/Time Out Yes - 15:09 Taken: Start Time: 15:09 Pain Control: Lidocaine 5% topical ointment T Area Debrided (L x W): otal 5 (cm) x 2.5 (cm) = 12.5 (cm) Tissue and other material debrided: Non-Viable, Slough, Subcutaneous, Slough Level: Skin/Subcutaneous Tissue Debridement Description: Excisional Instrument: Curette Bleeding: Minimum Hemostasis Achieved: Pressure Response to Treatment: Procedure  was tolerated well Level of Consciousness (Post- Awake and Alert procedure): Post Debridement Measurements of Total Wound Length: (cm) 5 Width: (cm) 2.5 Depth: (cm) 0.1 Volume: (cm) 0.982 Character of Wound/Ulcer Post Debridement: Improved Severity of Tissue Post Debridement: Fat layer exposed Post Procedure Diagnosis Same as Pre-procedure Notes scribed for Dr. Heber [place] by Adline Peals, RN Electronic Signature(s) Signed: 01/16/2023 3:46:51 PM By: Adline Peals Signed: 01/17/2023 3:34:03 PM By: Kalman Shan DO Entered By: Adline Peals on 01/16/2023 15:10:29 [name] [id number] Page 2 of 11 -------------------------------------------------------------------------------- HPI Details Patient Name: Date of Service: [name], [name] 01/16/2023 2:15 PM Medical Record Number: [id number] Patient Account Number: [id number] Date of Birth/Sex: Treating RN: 10-Mar-1946 (77 y.o. F) Primary Care Provider: [name] Other Clinician: Referring Provider: Treating Provider/Extender: Hattie Perch in Treatment: 24 History of Present Illness HPI Description: ADMISSION This is a 77 year old woman with a history of chronic venous insufficiency status post saphenous vein ablations in 2010 and 2016. 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 She presents today with an open venous ulcer on her left lower leg. It has been present for a little over 2 weeks. She saw her primary care provider who apparently swabbed the wound and grew out Pseudomonas. She just completed a course of ciprofloxacin for this. ABI was 0.91. She reports that she has had previous issues with ulcers in this same location, stemming back to a punch biopsy taken by a dermatologist many years ago. She has had several skin substitutes applied to the area that have ultimately resulted in healing on prior occasions. She  has 2 small ulcers on her left medial lower leg. There is slough accumulation in both of them. The more anterior of the 2 is quite small and has some soft slough on the surface, underneath which there is good granulation tissue. The more medial wound also has slough accumulation, but the underlying surface is fairly fibrotic and gritty. This is consistent with her provided history of multiple ulcers in the same location. 08/03/2022: The anterior wound is smaller today  with just a little bit of slough accumulation. The more medial wound continues to be very sensitive and fibrotic with slough buildup. 08/13/2022: The anterior wound is closed. The more medial wound remains sensitive with a fairly fibrotic surface. There is more granulation tissue filling in, however, and there is less slough than on prior occasions. 08/20/2022: The anterior wound remains closed. The more medial wound has filled with granulation tissue but still has a fair amount of slough on the surface and remains fairly tender. Unfortunately, she has developed 2 small ulcers just proximal to this. The fat layer is exposed in each. She says that over the weekend, she felt a burning sensation in that location. 08/27/2022: The 2 small wounds proximal to the main wound have merged into a single site. The wounds look a little bit dry, but they are quite clean without significant slough accumulation. They remain quite tender. 09/03/2022: All of the wounds have now merged into 1 large wound. There is a strong odor coming from the wound and the surface is not particularly viable. It is extremely painful for her today. 09/10/2022: The wound is less black and purple this week but still does not look particularly viable. The surface is desiccated. The culture that I took last week returned with a polymicrobial population including Pseudomonas. I prescribed both Augmentin and levofloxacin. She has not yet picked up levofloxacin, as her pharmacy only  notified her yesterday that it was ready. We have ordered a Keystone topical compounded antibiotic, but it has not yet come. 09/17/2022: The wound surface is markedly improved today. There is still an area of grayish-looking muscle but the rest appears significantly more viable. There is still a layer of slough on the surface. She still has a couple days left of oral antibiotic therapy. She has her [employer] compounded topical antibiotic with her today. 09/24/2022: She has completed her oral antibiotic therapy. The wound surface is much cleaner today and more viable without any necrotic tissue. It is a bit desiccated, however. 10/01/2022: The moisture balance of the wound has improved. There is a layer of yellow slough on the surface, but beneath this, the wound is more pink. 10/08/2022: The wound is smaller and cleaner today with a layer of slough present. She is having less pain. 12/18; this patient has a new wound on the right lateral ankle which apparently started after a cat scratched her. Secondarily she managed to drop a cake pan on the same area. This is very painful. The original wound was on the left posterior calf we have been using [employer] under an [employer]. The Unna boot fell down and apparently she was in for a nurse visit perhaps last week and that 1 fell down as well This patient has central venous mediated hypertension. For member correctly she has a stent placed in the left common iliac artery by Dr. Donzetta Matters some years ago [facility] Thurner syndrome] she also has had venous ablations and I believe a history of a DVT The patient has compression pumps at home but she does not use them 10/30/2022: The patient says that her wounds burned all week with the [facility] classic in place. She has a fair amount of slough and nonviable subcutaneous tissue present on the right ankle. The left posterior calf wound is cleaner than I have seen it to date. Both remain fairly tender. 11/14/2022:  Both wounds have substantial slough and eschar accumulation. They remain extremely tender. 11/21/2022: The wounds are cleaner this week, but  still have slough and eschar accumulation. She continues to describe burning pain in both wounds, but upon further questioning, she actually has burning in both of her feet that radiates up her legs and includes to the wounds. 11/28/2022: Both wounds have slough and eschar accumulation, as well as adherent silver alginate that was difficult to remove. She continues to have pain out of proportion to the extent of her wounds. Her PCP did initiate Lyrica which she started taking last night. The dose is quite low, only 25 mg, but apparently there is a plan for upward titration, assuming she tolerates the medication. 12/05/2022: The wounds are both a little bit smaller today, but have significant slough accumulation, as usual. They remain exquisitely tender. She is now taking Lyrica twice a day. 12/19/2022: Both of her wounds look significantly better this week. There is slough buildup on both, but they are smaller. She seems to be getting a good response from Lyrica as she is having much less pain. 12/26/2022: The wound on her right lateral ankle is nearly closed. The wound on her left posterior calf is smaller but still has a lot of slough accumulation. They remain tender. [name], [name] [id number] Page 3 of 11 01/02/2023: The right lateral ankle wound is down to just a couple of millimeters. It is much less tender than on prior occasions. There is minimal slough buildup. The wound on her posterior calf continues to contract, as well. It has thick slough buildup and remains fairly tender. 01/09/2023: The right lateral ankle wound is nearly closed. He remains tender. There is just a little eschar on the surface. The wound on her posterior calf has improved quite a bit. There is still slough on the surface, but the more proximal area  has epithelialized substantially and there is no longer any gray discoloration to her tissue. 3/13; patient presents for follow-up. Her right lateral ankle wound is almost healed. She has a wound to her left posterior calf with slough and granulation tissue. We have been using Santyl under Unna boots bilaterally to the lower extremities. She has no issues or complaints today. Electronic Signature(s) Signed: 01/17/2023 3:34:03 PM By: Kalman Shan DO Entered By: Kalman Shan on 01/16/2023 16:14:55 -------------------------------------------------------------------------------- Physical Exam Details Patient Name: Date of Service: [name], [name] 01/16/2023 2:15 PM Medical Record Number: [id number] Patient Account Number: [id number] Date of Birth/Sex: Treating RN: [birth year] (77 y.o. F) Primary Care Provider: [name] Other Clinician: Referring Provider: Treating Provider/Extender: Ree Kida Weeks in Treatment: 24 Constitutional respirations regular, non-labored and within target range for patient.. Cardiovascular 2+ dorsalis pedis/posterior tibialis pulses. Psychiatric pleasant and cooperative. Notes T the right lateral ankle there is a small wound but with mostly epithelization. Dry nonviable foot on the surface. The wound on her posterior calf has o granulation tissue and slough. No signs of surrounding tissue infection. Decent edema control. Electronic Signature(s) Signed: 01/17/2023 3:34:03 PM By: Kalman Shan DO Entered By: Kalman Shan on 01/16/2023 16:16:27 -------------------------------------------------------------------------------- Physician Orders Details Patient Name: Date of Service: [name], [name] 01/16/2023 2:15 PM Medical Record Number: [id number] Patient Account Number: [id number] Date of Birth/Sex: Treating RN: 14-Dec-1945 (77 y.o. [name] Primary Care Provider: [name] Other  Clinician: Referring Provider: Treating Provider/Extender: Hattie Perch in Treatment: 24 Verbal / Phone Orders: No Diagnosis Coding Follow-up Appointments ppointment in 1 week. - Dr. Celine Ahr RM 3 Return A Anesthetic Wound #2 Left,Posterior Lower Leg (In clinic) Topical Lidocaine 5% applied to wound bed -  in clinic, prior to debridement (In clinic) Topical Lidocaine 4% applied to wound bed Bathing/ Shower/ Hygiene May shower with protection but do not get wound dressing(s) wet. Protect dressing(s) with water repellant cover (for example, large plastic bag) or a cast cover and may then take shower. Edema Control - Lymphedema / SCD / Other Left Lower Extremity Lymphedema Pumps. Use Lymphedema pumps on leg(s) 2-3 times a day for 45-60 minutes. If wearing any wraps or hose, do not remove them. Continue exercising as instructed. - Use x1 per day [name], [name] [id number] Page 4 of 11 Avoid standing for long periods of time. Exercise regularly Wound Treatment Wound #2 - Lower Leg Wound Laterality: Left, Posterior Cleanser: Soap and Water 1 x Per Week/30 Days Discharge Instructions: May shower and wash wound with dial antibacterial soap and water prior to dressing change. Peri-Wound Care: Sween Lotion (Moisturizing lotion) 1 x Per Week/30 Days Discharge Instructions: Apply moisturizing lotion as directed Topical: Keystone 1 x Per Week/30 Days Discharge Instructions: Apply as directed to wound Prim Dressing: Santyl Ointment 1 x Per Week/30 Days ary Discharge Instructions: Apply nickel thick amount to wound bed as instructed Secondary Dressing: T Non-adherent Dressing, 2x3 in 1 x Per Week/30 Days elfa Discharge Instructions: Apply over primary dressing as directed. Secondary Dressing: Zetuvit Plus 4x8 in 1 x Per Week/30 Days Discharge Instructions: Apply over primary dressing as directed. Secured With: 64M Med[employer] Warehouse manager Surgical T 2x10 (in/yd) 1 x Per Week/30 Days ape Discharge Instructions: Secure with tape as directed. Compression Wrap: CoFlex Calamine Unna Boot 4 x 6 (in/yd) 1 x Per Week/30 Days Discharge Instructions: Apply Coflex [employer] as directed. Wound #3 - Ankle Wound Laterality: Right, Lateral Cleanser: Soap and Water 1 x Per Week/30 Days Discharge Instructions: May shower and wash wound with dial antibacterial soap and water prior to dressing change. Peri-Wound Care: Sween Lotion (Moisturizing lotion) 1 x Per Week/30 Days Discharge Instructions: Apply moisturizing lotion as directed Topical: Keystone 1 x Per Week/30 Days Discharge Instructions: Apply as directed to wound Prim Dressing: Santyl Ointment 1 x Per Week/30 Days ary Discharge Instructions: Apply nickel thick amount to wound bed as instructed Secondary Dressing: T Non-adherent Dressing, 2x3 in 1 x Per Week/30 Days elfa Discharge Instructions: Apply over primary dressing as directed. Secondary Dressing: Woven Gauze Sponge, Non-Sterile 4x4 in 1 x Per Week/30 Days Discharge Instructions: Apply over primary dressing as directed. Secured With: 64M Medipore Public affairs consultant Surgical T 2x10 (in/yd) 1 x Per Week/30 Days ape Discharge Instructions: Secure with tape as directed. Compression Wrap: CoFlex [employer]amine Unna Boot 4 x 6 (in/yd) 1 x Per Week/30 Days Discharge Instructions: Apply Coflex [employer] as directed. Patient Medications llergies: sulfa antibiotics, Voltaren, doxycycline, Neoprene, adhesive, latex A Notifications Medication Indication Start End 01/16/2023 lidocaine DOSE topical 5 % ointment - ointment topical Electronic Signature(s) Signed: 01/17/2023 3:34:03 PM By: Kalman Shan DO Previous Signature: 01/16/2023 3:46:51 PM Version By: Adline Peals Entered By: Kalman Shan on 01/16/2023 16:16:37 -------------------------------------------------------------------------------- Problem List  Details Patient Name: Date of Service: [name] 01/16/2023 2:15 PM Medical Record Number: [id number] Patient Account Number: [id number] SHYANE, DATTILO ([id number]) [id number] Page 5 of 11 Date of Birth/Sex: Treating RN: Feb 05, 1946 (77 y.o. F) Primary Care Provider: Other Clinician: [name] Referring Provider: Treating Provider/Extender: Hattie Perch in Treatment: 24 Active Problems ICD-10 Encounter Code Description Active Date MDM Diagnosis L97.822 Non-pressure chronic ulcer of other part of left lower leg with fat  layer exposed9/22/2023 No Yes L97.818 Non-pressure chronic ulcer of other part of right lower leg with other specified 10/22/2022 No Yes severity I83.893 Varicose veins of bilateral lower extremities with other complications 0000000 No Yes I87.2 Venous insufficiency (chronic) (peripheral) 07/27/2022 No Yes G62.9 Polyneuropathy, unspecified 07/27/2022 No Yes R60.0 Localized edema 07/27/2022 No Yes L03.115 Cellulitis of right lower limb 10/22/2022 No Yes Inactive Problems Resolved Problems Electronic Signature(s) Signed: 01/17/2023 3:34:03 PM By: Kalman Shan DO Entered By: Kalman Shan on 01/16/2023 16:13:46 -------------------------------------------------------------------------------- Progress Note Details Patient Name: Date of Service: [name] 01/16/2023 2:15 PM Medical Record Number: [id number] Patient Account Number: [id number] Date of Birth/Sex: Treating RN: 03/25/1946 (77 y.o. F) Primary Care Provider: [name] Other Clinician: Referring Provider: Treating Provider/Extender: Hattie Perch in Treatment: 24 Subjective Chief Complaint Information obtained from Patient Patient presents for treatment of an open ulcer due to venous insufficiency History of Present Illness (HPI) ADMISSION This is a 77 year old woman with a history of chronic  venous insufficiency status post saphenous vein ablations in 2010 and 2016. 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 She presents today with an open venous ulcer on her left lower leg. It has been present for a little over 2 weeks. She saw her primary care provider who apparently swabbed the wound and grew out Pseudomonas. She just completed a course of ciprofloxacin for this. ABI was 0.91. She reports that she has had previous issues with ulcers in this same location, stemming back to a punch biopsy taken by a dermatologist many years ago. She has had several skin substitutes applied to the area that have ultimately resulted in healing on prior occasions. She has 2 small ulcers on her left medial lower leg. There is slough accumulation in both of them. The more anterior of the 2 is quite small and has some soft ANAHID, MUSER ([id number]) [id number] Page 6 of 11 slough on the surface, underneath which there is good granulation tissue. The more medial wound also has slough accumulation, but the underlying surface is fairly fibrotic and gritty. This is consistent with her provided history of multiple ulcers in the same location. 08/03/2022: The anterior wound is smaller today with just a little bit of slough accumulation. The more medial wound continues to be very sensitive and fibrotic with slough buildup. 08/13/2022: The anterior wound is closed. The more medial wound remains sensitive with a fairly fibrotic surface. There is more granulation tissue filling in, however, and there is less slough than on prior occasions. 08/20/2022: The anterior wound remains closed. The more medial wound has filled with granulation tissue but still has a fair amount of slough on the surface and remains fairly tender. Unfortunately, she has developed 2 small ulcers just proximal to this. The fat layer is exposed in each. She says that over the weekend, she felt a  burning sensation in that location. 08/27/2022: The 2 small wounds proximal to the main wound have merged into a single site. The wounds look a little bit dry, but they are quite clean without significant slough accumulation. They remain quite tender. 09/03/2022: All of the wounds have now merged into 1 large wound. There is a strong odor coming from the wound and the surface is not particularly viable. It is extremely painful for her today. 09/10/2022: The wound is less black and purple this week but still does not look particularly viable. The surface is desiccated. The culture that I took last week returned with  a polymicrobial population including Pseudomonas. I prescribed both Augmentin and levofloxacin. She has not yet picked up levofloxacin, as her pharmacy only notified her yesterday that it was ready. We have ordered a Keystone topical compounded antibiotic, but it has not yet come. 09/17/2022: The wound surface is markedly improved today. There is still an area of grayish-looking muscle but the rest appears significantly more viable. There is still a layer of slough on the surface. She still has a couple days left of oral antibiotic therapy. She has her [employer] compounded topical antibiotic with her today. 09/24/2022: She has completed her oral antibiotic therapy. The wound surface is much cleaner today and more viable without any necrotic tissue. It is a bit desiccated, however. 10/01/2022: The moisture balance of the wound has improved. There is a layer of yellow slough on the surface, but beneath this, the wound is more pink. 10/08/2022: The wound is smaller and cleaner today with a layer of slough present. She is having less pain. 12/18; this patient has a new wound on the right lateral ankle which apparently started after a cat scratched her. Secondarily she managed to drop a cake pan on the same area. This is very painful. The original wound was on the left posterior calf we have been  using [employer] under an [employer]. The Unna boot fell down and apparently she was in for a nurse visit perhaps last week and that 1 fell down as well This patient has central venous mediated hypertension. For member correctly she has a stent placed in the left common iliac artery by Dr. Donzetta Matters some years ago [facility] Thurner syndrome] she also has had venous ablations and I believe a history of a DVT The patient has compression pumps at home but she does not use them 10/30/2022: The patient says that her wounds burned all week with the [facility] classic in place. She has a fair amount of slough and nonviable subcutaneous tissue present on the right ankle. The left posterior calf wound is cleaner than I have seen it to date. Both remain fairly tender. 11/14/2022: Both wounds have substantial slough and eschar accumulation. They remain extremely tender. 11/21/2022: The wounds are cleaner this week, but still have slough and eschar accumulation. She continues to describe burning pain in both wounds, but upon further questioning, she actually has burning in both of her feet that radiates up her legs and includes to the wounds. 11/28/2022: Both wounds have slough and eschar accumulation, as well as adherent silver alginate that was difficult to remove. She continues to have pain out of proportion to the extent of her wounds. Her PCP did initiate Lyrica which she started taking last night. The dose is quite low, only 25 mg, but apparently there is a plan for upward titration, assuming she tolerates the medication. 12/05/2022: The wounds are both a little bit smaller today, but have significant slough accumulation, as usual. They remain exquisitely tender. She is now taking Lyrica twice a day. 12/19/2022: Both of her wounds look significantly better this week. There is slough buildup on both, but they are smaller. She seems to be getting a good response from Lyrica as she is having much less  pain. 12/26/2022: The wound on her right lateral ankle is nearly closed. The wound on her left posterior calf is smaller but still has a lot of slough accumulation. They remain tender. 01/02/2023: The right lateral ankle wound is down to just a couple of millimeters. It is much less  tender than on prior occasions. There is minimal slough buildup. The wound on her posterior calf continues to contract, as well. It has thick slough buildup and remains fairly tender. 01/09/2023: The right lateral ankle wound is nearly closed. He remains tender. There is just a little eschar on the surface. The wound on her posterior calf has improved quite a bit. There is still slough on the surface, but the more proximal area has epithelialized substantially and there is no longer any gray discoloration to her tissue. 3/13; patient presents for follow-up. Her right lateral ankle wound is almost healed. She has a wound to her left posterior calf with slough and granulation tissue. We have been using Santyl under Unna boots bilaterally to the lower extremities. She has no issues or complaints today. Patient History Information obtained from Patient. Family History Cancer - Mother,Paternal Grandparents, Diabetes - Siblings, Heart Disease - Father, Hypertension - Father. Social History Former smoker - quit in 1967. Medical History Eyes Patient has history of Cataracts - L eye Ear/Nose/Mouth/Throat Denies history of Chronic sinus problems/congestion, Middle ear problems Hematologic/Lymphatic [name], [name] [id number] Page 7 of 11 Denies history of Anemia, Human Immunodeficiency Virus, Lymphedema Respiratory Patient has history of Asthma Cardiovascular Patient has history of Hypertension Endocrine Denies history of Type I Diabetes, Type II Diabetes Neurologic Patient has history of Neuropathy - Feet and finger Denies history of Seizure Disorder Oncologic Patient has  history of Received Chemotherapy - 2012, Received Radiation - 2012 Hospitalization/Surgery History - Lower extremity venography 2020. - Intravascular ultrasound. - peripheral vascular intervention. - melanoma 2011. 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 Vitals Time Taken: 2:26 AM, Height: 63 in, Weight: 200 lbs, BMI: 35.4, Temperature: 98.0 F, Pulse: 77 bpm, Respiratory Rate: 20 breaths/min, Blood Pressure: 177/92 mmHg. Cardiovascular 2+ dorsalis pedis/posterior tibialis pulses. Psychiatric pleasant and cooperative. General Notes: T the right lateral ankle there is a small wound but with mostly epithelization. Dry nonviable foot on the surface. The wound on her posterior o calf has granulation tissue and slough. No signs of surrounding tissue infection. Decent edema control. Integumentary (Hair, Skin) Wound #2 status is Open. Original cause of wound was Blister. The date acquired was: 07/06/2022. The wound has been in treatment 24 weeks. The wound is located on the Left,Posterior Lower Leg. The wound measures 5cm length x 2.5cm width x 0.1cm depth; 9.817cm^2 area and 0.982cm^3 volume. There is Fat Layer (Subcutaneous Tissue) exposed. There is no tunneling or undermining noted. There is a medium amount of serosanguineous drainage noted. The wound margin is distinct with the outline attached to the wound base. There is medium (34-66%) pink granulation within the wound bed. There is a medium (34-66%) amount of necrotic tissue within the wound bed including Eschar and Adherent Slough. The periwound skin appearance exhibited: Scarring, Rubor. Periwound temperature was noted as No  Abnormality. The periwound has tenderness on palpation. Wound #3 status is Open. Original cause of wound was Contusion/Bruise. The date acquired was: 10/19/2022. The wound has been in treatment 12 weeks. The wound is located on the Right,Lateral Ankle. The wound measures 0.3cm length x 0.3cm width x 0.1cm depth; 0.071cm^2 area and 0.007cm^3 volume. There is Fat Layer (Subcutaneous Tissue) exposed. There is no tunneling or undermining noted. There is a medium amount of serosanguineous  drainage noted. The wound margin is flat and intact. There is medium (34-66%) pink granulation within the wound bed. There is a medium (34-66%) amount of necrotic tissue within the wound bed including Adherent Slough. The periwound skin appearance had no abnormalities noted for texture. The periwound skin appearance had no abnormalities noted for moisture. The periwound skin appearance did not exhibit: Erythema. Periwound temperature was noted as No Abnormality. Assessment Active Problems ICD-10 Non-pressure chronic ulcer of other part of left lower leg with fat layer exposed Non-pressure chronic ulcer of other part of right lower leg with other specified severity Varicose veins of bilateral lower extremities with other complications Venous insufficiency (chronic) (peripheral) Polyneuropathy, unspecified Localized edema Cellulitis of right lower limb [name], [name] [id number] Page 8 of 11 Patient's right foot wound is almost healed. The left posterior wound is stable. I debrided nonviable tissue. I recommended continuing the course with Santyl under Unna boot. Procedures Wound #2 Pre-procedure diagnosis of Wound #2 is a Venous Leg Ulcer located on the Left,Posterior Lower Leg .Severity of Tissue Pre Debridement is: Fat layer exposed. There was a Excisional Skin/Subcutaneous Tissue Debridement with a total area of 12.5 sq cm performed by Kalman Shan, DO. With  the following instrument(s): Curette to remove Non-Viable tissue/material. Material removed includes Subcutaneous Tissue and Slough and after achieving pain control using Lidocaine 5% topical ointment. No specimens were taken. A time out was conducted at 15:09, prior to the start of the procedure. A Minimum amount of bleeding was controlled with Pressure. The procedure was tolerated well. Post Debridement Measurements: 5cm length x 2.5cm width x 0.1cm depth; 0.982cm^3 volume. Character of Wound/Ulcer Post Debridement is improved. Severity of Tissue Post Debridement is: Fat layer exposed. Post procedure Diagnosis Wound #2: Same as Pre-Procedure General Notes: scribed for Dr. Heber [place] by Adline Peals, RN. Pre-procedure diagnosis of Wound #2 is a Venous Leg Ulcer located on the Left,Posterior Lower Leg . There was a Double Layer Compression Therapy Procedure by Adline Peals, RN. Post procedure Diagnosis Wound #2: Same as Pre-Procedure Wound #3 Pre-procedure diagnosis of Wound #3 is a Venous Leg Ulcer located on the Right,Lateral Ankle . There was a Double Layer Compression Therapy Procedure by Adline Peals, RN. Post procedure Diagnosis Wound #3: Same as Pre-Procedure Plan Follow-up Appointments: Return Appointment in 1 week. - Dr. Celine Ahr RM 3 Anesthetic: Wound #2 Left,Posterior Lower Leg: (In clinic) Topical Lidocaine 5% applied to wound bed - in clinic, prior to debridement (In clinic) Topical Lidocaine 4% applied to wound bed Bathing/ Shower/ Hygiene: May shower with protection but do not get wound dressing(s) wet. Protect dressing(s) with water repellant cover (for example, large plastic bag) or a cast cover and may then take shower. Edema Control - Lymphedema / SCD / Other: Lymphedema Pumps. Use Lymphedema pumps on leg(s) 2-3 times a day for 45-60 minutes. If wearing any wraps or hose, do not remove them. Continue exercising as instructed. - Use x1 per day Avoid standing  for long periods of time. Exercise regularly The following medication(s) was prescribed: lidocaine topical 5 % ointment ointment topical was prescribed at facility WOUND #2: - Lower Leg Wound Laterality: Left, Posterior Cleanser: Soap and Water 1 x Per Week/30 Days Discharge Instructions: May shower and wash wound with dial antibacterial soap and water prior to dressing change. Peri-Wound Care: Sween Lotion (Moisturizing lotion) 1 x Per Week/30 Days Discharge Instructions: Apply moisturizing lotion as directed Topical: Keystone 1 x Per Week/30 Days Discharge Instructions: Apply as directed to wound  Prim Dressing: Santyl Ointment 1 x Per Week/30 Days ary Discharge Instructions: Apply nickel thick amount to wound bed as instructed Secondary Dressing: T Non-adherent Dressing, 2x3 in 1 x Per Week/30 Days elfa Discharge Instructions: Apply over primary dressing as directed. Secondary Dressing: Zetuvit Plus 4x8 in 1 x Per Week/30 Days Discharge Instructions: Apply over primary dressing as directed. Secured With: 848M Medipore Public affairs consultant Surgical T 2x10 (in/yd) 1 x Per Week/30 Days ape Discharge Instructions: Secure with tape as directed. Com pression Wrap: CoFlex Calamine Unna Boot 4 x 6 (in/yd) 1 x Per Week/30 Days Discharge Instructions: Apply Coflex [employer]amine [employer] as directed. WOUND #3: - Ankle Wound Laterality: Right, Lateral Cleanser: Soap and Water 1 x Per Week/30 Days Discharge Instructions: May shower and wash wound with dial antibacterial soap and water prior to dressing change. Peri-Wound Care: Sween Lotion (Moisturizing lotion) 1 x Per Week/30 Days Discharge Instructions: Apply moisturizing lotion as directed Topical: Keystone 1 x Per Week/30 Days Discharge Instructions: Apply as directed to wound Prim Dressing: Santyl Ointment 1 x Per Week/30 Days ary Discharge Instructions: Apply nickel thick amount to wound bed as instructed Secondary Dressing: T Non-adherent Dressing, 2x3  in 1 x Per Week/30 Days elfa Discharge Instructions: Apply over primary dressing as directed. Secondary Dressing: Woven Gauze Sponge, Non-Sterile 4x4 in 1 x Per Week/30 Days Discharge Instructions: Apply over primary dressing as directed. Secured With: 848M Medipore Public affairs consultant Surgical T 2x10 (in/yd) 1 x Per Week/30 Days ape Discharge Instructions: Secure with tape as directed. Com pression Wrap: CoFlex Calamine Unna Boot 4 x 6 (in/yd) 1 x Per Week/30 Days Discharge Instructions: Apply Coflex [employer] as directed. [name], [name] [id number] Page 9 of 11 1. Santyl under Unna boots to the lower extremities bilaterally 2. Follow-up in 1 week Electronic Signature(s) Signed: 01/17/2023 3:34:03 PM By: Kalman Shan DO Entered By: Kalman Shan on 01/16/2023 16:18:09 -------------------------------------------------------------------------------- HxROS Details Patient Name: Date of Service: [name], [name]. 01/16/2023 2:15 PM Medical Record Number: [id number] Patient Account Number: [id number] Date of Birth/Sex: Treating RN: 12/31/1945 (77 y.o. F) Primary Care Provider: [name] Other Clinician: Referring Provider: Treating Provider/Extender: Hattie Perch in Treatment: 24 Information Obtained From Patient Eyes Medical History: Positive for: Cataracts - L eye Ear/Nose/Mouth/Throat Medical History: Negative for: Chronic sinus problems/congestion; Middle ear problems Hematologic/Lymphatic Medical History: Negative for: Anemia; Human Immunodeficiency Virus; Lymphedema Respiratory Medical History: Positive for: Asthma Past Medical History Notes: OSA on CPAP Cardiovascular Medical History: Positive for: Hypertension Past Medical History Notes: Cronic venous insufficiency 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 Gastrointestinal Medical History: Past Medical History  Notes: liver cyst Endocrine Medical History: Negative for: Type I Diabetes; Type II Diabetes Musculoskeletal Medical History: Past Medical History Notes: arthritis Neurologic Medical History: Positive for: Neuropathy - Feet and finger Negative for: Seizure Disorder Past Medical History Notes: restless leg syndrome ANNTONETTE, KUCHERA ([id number]) [id number] Page 10 of 11 Oncologic Medical History: Positive for: Received Chemotherapy - 2012; Received Radiation - 2012 HBO Extended History Items Eyes: Cataracts Immunizations Pneumococcal Vaccine: Received Pneumococcal Vaccination: Yes Received Pneumococcal Vaccination On or After 60th Birthday: Yes Implantable Devices No devices added Hospitalization / Surgery History Type of Hospitalization/Surgery Lower extremity venography 2020 Intravascular ultrasound peripheral vascular intervention melanoma 2011 Family and Social History Cancer: Yes - Mother,Paternal Grandparents; Diabetes: Yes - Siblings; Heart Disease: Yes - Father; Hypertension: Yes - Father; Former smoker - quit in [employer]) Signed: 01/17/2023 3:34:03 PM By: Kalman Shan  DO Entered By: Kalman Shan on 01/16/2023 16:15:03 -------------------------------------------------------------------------------- SuperBill Details Patient Name: Date of Service: MACALA, PROKOSCH 01/16/2023 Medical Record Number: [id number] Patient Account Number: [id number] Date of Birth/Sex: Treating RN: 07-04-1946 (77 y.o. F) Primary Care Provider: [name] Other Clinician: Referring Provider: Treating Provider/Extender: Hattie Perch in Treatment: 24 Diagnosis Coding ICD-10 Codes Code Description [phone] Non-pressure chronic ulcer of other part of left lower leg with fat layer exposed L97.818 Non-pressure chronic ulcer of other part of right lower leg with other specified severity I83.893 Varicose  veins of bilateral lower extremities with other complications [id number] Venous insufficiency (chronic) (peripheral) G62.9 Polyneuropathy, unspecified R60.0 Localized edema L03.115 Cellulitis of right lower limb Facility Procedures : CPT4 Code: [id number] Description: [id number] - DEB SUBQ TISSUE 20 SQ CM/< ICD-10 Diagnosis Description L97.822 Non-pressure chronic ulcer of other part of left lower leg with fat layer expos Modifier: ed Quantity: 1 Physician Procedures : CPT4 Code Description Modifier [id number] - WC PHYS LEVEL 3 - EST PT 25 ICD-10 Diagnosis Description L97.818 Non-pressure chronic ulcer of other part of right lower leg with other specified severity AMIRI, STULL ([id number])  [id number] I87.2 Venous insufficiency (chronic) (peripheral) Quantity: 1 .pdf Page 11 of 11 : [id number] - WC PHYS SUBQ TISS 20 SQ CM 1 ICD-10 Diagnosis Description L97.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d Quantity: Electronic Signature(s) Signed: 01/17/2023 3:34:03 PM By: Kalman Shan DO Entered By: Kalman Shan on 01/16/2023 16:19:18

## 2023-01-18 NOTE — Progress Notes (Signed)
RAYCHEL, [name] ([id number]) [id number] Page 1 of 9 Visit Report for 01/16/2023 Arrival Information Details Patient Name: Date of Service: [name], [name] 01/16/2023 2:15 PM Medical Record Number: [id number] Patient Account Number: [id number] Date of Birth/Sex: Treating RN: [birth year] (77 y.o. F) Primary Care Ercelle Winkles: [name] Other Clinician: Referring Grizel Vesely: Treating Lisanne Ponce/Extender: Hattie Perch in Treatment: 24 Visit Information History Since Last Visit All ordered tests and consults were completed: No Patient Arrived: Ambulatory Added or deleted any medications: No Arrival Time: 14:26 Any new allergies or adverse reactions: No Accompanied By: self Had a fall or experienced change in No Transfer Assistance: None activities of daily living that may affect Patient Identification Verified: Yes risk of falls: Secondary Verification Process Completed: Yes Signs or symptoms of abuse/neglect since last visito No Patient Requires Transmission-Based Precautions: No Hospitalized since last visit: No Patient Has Alerts: No Implantable device outside of the clinic excluding No cellular tissue based products placed in the center since last visit: Pain Present Now: No Electronic Signature(s) Signed: 01/16/2023 4:14:01 PM By: Worthy Rancher Entered By: Worthy Rancher on 01/16/2023 14:26:52 -------------------------------------------------------------------------------- Compression Therapy Details Patient Name: Date of Service: Amber, [name] 01/16/2023 2:15 PM Medical Record Number: [id number] Patient Account Number: [id number] Date of Birth/Sex: Treating RN: 1946/10/27 (77 y.o. [name] Primary Care Hosteen Kienast: [name] Other Clinician: Referring Enyla Lisbon: Treating Chancie Lampert/Extender: Hattie Perch in Treatment: 24 Compression Therapy Performed for Wound Assessment: Wound #2  Left,Posterior Lower Leg Performed By: Clinician Adline Peals, RN Compression Type: Double Layer Post Procedure Diagnosis Same as Pre-procedure Electronic Signature(s) Signed: 01/16/2023 3:46:51 PM By: Adline Peals Entered By: Adline Peals on 01/16/2023 15:10:42 -------------------------------------------------------------------------------- Compression Therapy Details Patient Name: Date of Service: Amber, [name] 01/16/2023 2:15 PM Medical Record Number: [id number] Patient Account Number: [id number] Date of Birth/Sex: Treating RN: [birth year] (77 y.o. [name] Primary Care Hayde Kilgour: [name] Other Clinician: Referring Jamisen Duerson: Treating Raiya Stainback/Extender: Ree Kida Weeks in Treatment: 24 Compression Therapy Performed for Wound Assessment: Wound #3 Right,Lateral Ankle Performed By: Clinician Adline Peals, RN Compression Type: Double Layer Post Procedure Diagnosis Same as Vivi Barrack ([id number]) [id number] Page 2 of 9 Electronic Signature(s) Signed: 01/16/2023 3:46:51 PM By: Sabas Sous By: Adline Peals on 01/16/2023 15:10:42 -------------------------------------------------------------------------------- Encounter Discharge Information Details Patient Name: Date of Service: Amber, [name] 01/16/2023 2:15 PM Medical Record Number: [id number] Patient Account Number: [id number] Date of Birth/Sex: Treating RN: [birth year] (77 y.o. [name] Primary Care Diesel Lina: [name] Other Clinician: Referring Batu Cassin: Treating Charlot Gouin/Extender: Hattie Perch in Treatment: 24 Encounter Discharge Information Items Post Procedure Vitals Discharge Condition: Stable Temperature (F): 98 Ambulatory Status: Ambulatory Pulse (bpm): 77 Discharge Destination: Home Respiratory Rate (breaths/min): 20 Transportation:  Private Auto Blood Pressure (mmHg): 177/92 Accompanied By: self Schedule Follow-up Appointment: Yes Clinical Summary of Care: Patient Declined Electronic Signature(s) Signed: 01/16/2023 3:46:51 PM By: Adline Peals Entered By: Adline Peals on 01/16/2023 15:31:32 -------------------------------------------------------------------------------- Lower Extremity Assessment Details Patient Name: Date of Service: [name], [name] 01/16/2023 2:15 PM Medical Record Number: [id number] Patient Account Number: [id number] Date of Birth/Sex: Treating RN: 12/09/1945 (77 y.o. [name] Primary Care Jolyssa Oplinger: [name] Other Clinician: Referring Tashae Inda: Treating Deola Rewis/Extender: Ree Kida Weeks in Treatment: 24 Edema Assessment Assessed: [Left: No] [Right: No] Edema: [Left: Ye] [Right: s] Calf Left: Right: Point of Measurement: From Medial Instep 41 cm 40.5 cm Ankle Left: Right: Point of Measurement: From Medial Instep 24 cm 24  cm Vascular Assessment Pulses: Dorsalis Pedis Palpable: [Left:Yes] [Right:Yes] Electronic Signature(s) Signed: 01/16/2023 3:46:51 PM By: Adline Peals Entered By: Adline Peals on 01/16/2023 14:38:40 -------------------------------------------------------------------------------- Multi Wound Chart Details Patient Name: Date of Service: [name]. 01/16/2023 2:15 PM [name] [id number] Page 3 of 9 Medical Record Number: [id number] Patient Account Number: [id number] Date of Birth/Sex: Treating RN: November 22, 1945 (77 y.o. F) Primary Care Natalija Mavis: [name] Other Clinician: Referring Sinai Illingworth: Treating Mckaylee Dimalanta/Extender: Ree Kida Weeks in Treatment: 24 Vital Signs Height(in): 63 Pulse(bpm): 29 Weight(lbs): 200 Blood Pressure(mmHg): 177/92 Body Mass Index(BMI): 35.4 Temperature(F): 98.0 Respiratory Rate(breaths/min):  20 [2:Photos:] [3:No Photos] [N/A:N/A] Left, Posterior Lower Leg Right, Lateral Ankle N/A Wound Location: Blister Contusion/Bruise N/A Wounding Event: Venous Leg Ulcer Venous Leg Ulcer N/A Primary Etiology: Cataracts, Asthma, Hypertension, Cataracts, Asthma, Hypertension, N/A Comorbid History: Neuropathy, Received Chemotherapy, Neuropathy, Received Chemotherapy, Received Radiation Received Radiation 07/06/2022 10/19/2022 N/A Date Acquired: 24 12 N/A Weeks of Treatment: Open Open N/A Wound Status: No No N/A Wound Recurrence: 5x2.5x0.1 0.3x0.3x0.1 N/A Measurements L x W x D (cm) 9.817 0.071 N/A A (cm) : rea 0.982 0.007 N/A Volume (cm) : -42.90% 97.30% N/A % Reduction in A rea: -42.90% 97.40% N/A % Reduction in Volume: Full Thickness Without Exposed Full Thickness Without Exposed N/A Classification: Support Structures Support Structures Medium Medium N/A Exudate A mount: Serosanguineous Serosanguineous N/A Exudate Type: red, brown red, brown N/A Exudate Color: Distinct, outline attached Flat and Intact N/A Wound Margin: Medium (34-66%) Medium (34-66%) N/A Granulation A mount: Pink Pink N/A Granulation Quality: Medium (34-66%) Medium (34-66%) N/A Necrotic A mount: Eschar, Adherent Slough Adherent Slough N/A Necrotic Tissue: Fat Layer (Subcutaneous Tissue): Yes Fat Layer (Subcutaneous Tissue): Yes N/A Exposed Structures: Fascia: No Fascia: No Tendon: No Tendon: No Muscle: No Muscle: No Joint: No Joint: No Bone: No Bone: No Small (1-33%) Medium (34-66%) N/A Epithelialization: Debridement - Excisional N/A N/A Debridement: Pre-procedure Verification/Time Out 15:09 N/A N/A Taken: Lidocaine 5% topical ointment N/A N/A Pain Control: Subcutaneous, Slough N/A N/A Tissue Debrided: Skin/Subcutaneous Tissue N/A N/A Level: 12.5 N/A N/A Debridement A (sq cm): rea Curette N/A N/A Instrument: Minimum N/A N/A Bleeding: Pressure N/A N/A Hemostasis A  chieved: Procedure was tolerated well N/A N/A Debridement Treatment Response: 5x2.5x0.1 N/A N/A Post Debridement Measurements L x W x D (cm) 0.982 N/A N/A Post Debridement Volume: (cm) Scarring: Yes Excoriation: Yes N/A Periwound Skin Texture: Maceration: Yes N/A Periwound Skin Moisture: Rubor: Yes Erythema: No N/A Periwound Skin Color: No Abnormality No Abnormality N/A Temperature: Yes N/A N/A Tenderness on Palpation: Compression Therapy Compression Therapy N/A Procedures Performed: Debridement Treatment Notes Wound #2 (Lower Leg) Wound Laterality: Left, Posterior RASHANTI, KUJATH ([id number]) [id number] Page 4 of 9 Cleanser Soap and Water Discharge Instruction: May shower and wash wound with dial antibacterial soap and water prior to dressing change. Peri-Wound Care Sween Lotion (Moisturizing lotion) Discharge Instruction: Apply moisturizing lotion as directed Topical Keystone Discharge Instruction: Apply as directed to wound Primary Dressing Santyl Ointment Discharge Instruction: Apply nickel thick amount to wound bed as instructed Secondary Dressing T Non-adherent Dressing, 2x3 in elfa Discharge Instruction: Apply over primary dressing as directed. Zetuvit Plus 4x8 in Discharge Instruction: Apply over primary dressing as directed. Secured With [employer] Surgical T 2x10 (in/yd) ape Discharge Instruction: Secure with tape as directed. Compression Wrap CoFlex Calamine Unna Boot 4 x 6 (in/yd) Discharge Instruction: Apply Coflex Calamine [employer] as directed. Compression Stockings Add-Ons Wound #3 (Ankle) Wound Laterality: Right, Lateral Cleanser  Soap and Water Discharge Instruction: May shower and wash wound with dial antibacterial soap and water prior to dressing change. Peri-Wound Care Sween Lotion (Moisturizing lotion) Discharge Instruction: Apply moisturizing lotion as directed Topical Keystone Discharge  Instruction: Apply as directed to wound Primary Dressing Santyl Ointment Discharge Instruction: Apply nickel thick amount to wound bed as instructed Secondary Dressing T Non-adherent Dressing, 2x3 in elfa Discharge Instruction: Apply over primary dressing as directed. Woven Gauze Sponge, Non-Sterile 4x4 in Discharge Instruction: Apply over primary dressing as directed. Secured With [employer] Surgical T 2x10 (in/yd) ape Discharge Instruction: Secure with tape as directed. Compression Wrap CoFlex Calamine Unna Boot 4 x 6 (in/yd) Discharge Instruction: Apply Coflex [employer] as directed. Compression Stockings Add-Ons Electronic Signature(s) MONSE, CUNICO ([id number]) [id number] Page 5 of 9 Signed: 01/17/2023 3:34:03 PM By: Kalman Shan DO Entered By: Kalman Shan on 01/16/2023 16:13:52 -------------------------------------------------------------------------------- Multi-Disciplinary Care Plan Details Patient Name: Date of Service: ZAHRIA, SIEG 01/16/2023 2:15 PM Medical Record Number: [id number] Patient Account Number: [id number] Date of Birth/Sex: Treating RN: 09-30-1946 (77 y.o. [name] Primary Care Jamielee Mchale: [name] Other Clinician: Referring [name] Goodspeed: Treating Khaila Velarde/Extender: Ree Kida Weeks in Treatment: 24 Active Inactive Wound/Skin Impairment Nursing Diagnoses: Impaired tissue integrity Goals: Ulcer/skin breakdown will have a volume reduction of 30% by week 4 Date Initiated: 08/03/2022 Date Inactivated: 09/10/2022 Target Resolution Date: 08/31/2022 Unmet Reason: uncontrolled edema, Goal Status: Unmet acute infection Ulcer/skin breakdown will have a volume reduction of 50% by week 8 Date Initiated: 09/10/2022 Target Resolution Date: 04/05/2023 Goal Status: Active Interventions: Assess ulceration(s) every visit Treatment Activities: Skin care regimen  initiated : 08/03/2022 Notes: Keystone ordered. 10/30 RX Levo started. 09/10/22 Electronic Signature(s) Signed: 01/16/2023 3:46:51 PM By: Adline Peals Entered By: Adline Peals on 01/16/2023 14:36:17 -------------------------------------------------------------------------------- Pain Assessment Details Patient Name: Date of Service: PRIYAL, MCLAY 01/16/2023 2:15 PM Medical Record Number: [id number] Patient Account Number: [id number] Date of Birth/Sex: Treating RN: 07-04-1946 (77 y.o. F) Primary Care Francisca Langenderfer: [name] Other Clinician: Referring Natan Hartog: Treating Shacola Schussler/Extender: Hattie Perch in Treatment: 24 Active Problems Location of Pain Severity and Description of Pain Patient Has Paino No Site Locations PASHANCE, WILTSE ([id number]) [id number] Page 6 of 9 Pain Management and Medication Current Pain Management: Electronic Signature(s) Signed: 01/16/2023 4:14:01 PM By: Worthy Rancher Entered By: Worthy Rancher on 01/16/2023 14:27:19 -------------------------------------------------------------------------------- Patient/Caregiver Education Details Patient Name: Date of Service: BRANIGAN, HANELINE 3/13/2024andnbsp2:15 PM Medical Record Number: [id number] Patient Account Number: [id number] Date of Birth/Gender: Treating RN: 1946/05/26 (77 y.o. [name] Primary Care Physician: [name] Other Clinician: Referring Physician: Treating Physician/Extender: Hattie Perch in Treatment: 24 Education Assessment Education Provided To: Patient Education Topics Provided Wound/Skin Impairment: Methods: Explain/Verbal Responses: Reinforcements needed, State content correctly Electronic Signature(s) Signed: 01/16/2023 3:46:51 PM By: Adline Peals Entered By: Adline Peals on 01/16/2023  14:36:38 -------------------------------------------------------------------------------- Wound Assessment Details Patient Name: Date of Service: ANNETTIE, MULLAN 01/16/2023 2:15 PM Medical Record Number: [id number] Patient Account Number: [id number] Date of Birth/Sex: Treating RN: 04-Aug-1946 (77 y.o. F) Primary Care Carmel Garfield: [name] Other Clinician: Referring Dinora Hemm: Treating Hosey Burmester/Extender: Ree Kida Weeks in Treatment: 24 Wound Status Wound Number: 2 Primary Venous Leg Ulcer Etiology: Wound Location: Left, Posterior Lower Leg Wound Open Wounding Event: Blister Status: Date Acquired: 07/06/2022 Comorbid Cataracts, Asthma, Hypertension, Neuropathy, Received Weeks Of Treatment: 24 History: Chemotherapy, Received Radiation Clustered Wound: No KANAIYA, NEWBOLD ([id number]) [id number] Page 7 of 9 Photos Wound  Measurements Length: (cm) 5 Width: (cm) 2.5 Depth: (cm) 0.1 Area: (cm) 9.817 Volume: (cm) 0.982 % Reduction in Area: -42.9% % Reduction in Volume: -42.9% Epithelialization: Small (1-33%) Tunneling: No Undermining: No Wound Description Classification: Full Thickness Without Exposed Support Structures Wound Margin: Distinct, outline attached Exudate Amount: Medium Exudate Type: Serosanguineous Exudate Color: red, brown Foul Odor After Cleansing: No Slough/Fibrino Yes Wound Bed Granulation Amount: Medium (34-66%) Exposed Structure Granulation Quality: Pink Fascia Exposed: No Necrotic Amount: Medium (34-66%) Fat Layer (Subcutaneous Tissue) Exposed: Yes Necrotic Quality: Eschar, Adherent Slough Tendon Exposed: No Muscle Exposed: No Joint Exposed: No Bone Exposed: No Periwound Skin Texture Texture Color No Abnormalities Noted: No No Abnormalities Noted: No Scarring: Yes Rubor: Yes Moisture Temperature / Pain No Abnormalities Noted: No Temperature: No Abnormality Tenderness on Palpation:  Yes Treatment Notes Wound #2 (Lower Leg) Wound Laterality: Left, Posterior Cleanser Soap and Water Discharge Instruction: May shower and wash wound with dial antibacterial soap and water prior to dressing change. Peri-Wound Care Sween Lotion (Moisturizing lotion) Discharge Instruction: Apply moisturizing lotion as directed Topical Keystone Discharge Instruction: Apply as directed to wound Primary Dressing Santyl Ointment Discharge Instruction: Apply nickel thick amount to wound bed as instructed Secondary Dressing T Non-adherent Dressing, 2x3 in elfa Discharge Instruction: Apply over primary dressing as directed. Zetuvit Plus 4x8 in Discharge Instruction: Apply over primary dressing as directed. DAYANIRA, BRENGLE ([id number]) [id number] Page 8 of 9 Secured With [employer] Surgical T 2x10 (in/yd) ape Discharge Instruction: Secure with tape as directed. Compression Wrap CoFlex Calamine Unna Boot 4 x 6 (in/yd) Discharge Instruction: Apply Coflex [employer] as directed. Compression Stockings Add-Ons Electronic Signature(s) Signed: 01/16/2023 3:46:51 PM By: Adline Peals Entered By: Adline Peals on 01/16/2023 14:39:44 -------------------------------------------------------------------------------- Wound Assessment Details Patient Name: Date of Service: MOZEL, PSENCIK 01/16/2023 2:15 PM Medical Record Number: [id number] Patient Account Number: [id number] Date of Birth/Sex: Treating RN: 17-Aug-1946 (77 y.o. F) Primary Care Chanel Mcadams: [name] Other Clinician: Referring Cayce Paschal: Treating Julianny Milstein/Extender: Ree Kida Weeks in Treatment: 24 Wound Status Wound Number: 3 Primary Venous Leg Ulcer Etiology: Wound Location: Right, Lateral Ankle Wound Open Wounding Event: Contusion/Bruise Status: Date Acquired: 10/19/2022 Comorbid Cataracts, Asthma, Hypertension, Neuropathy, Received Weeks Of  Treatment: 12 History: Chemotherapy, Received Radiation Clustered Wound: No Wound Measurements Length: (cm) 0.3 Width: (cm) 0.3 Depth: (cm) 0.1 Area: (cm) 0.071 Volume: (cm) 0.007 % Reduction in Area: 97.3% % Reduction in Volume: 97.4% Epithelialization: Medium (34-66%) Tunneling: No Undermining: No Wound Description Classification: Full Thickness Without Exposed Support Structures Wound Margin: Flat and Intact Exudate Amount: Medium Exudate Type: Serosanguineous Exudate Color: red, brown Foul Odor After Cleansing: No Slough/Fibrino Yes Wound Bed Granulation Amount: Medium (34-66%) Exposed Structure Granulation Quality: Pink Fascia Exposed: No Necrotic Amount: Medium (34-66%) Fat Layer (Subcutaneous Tissue) Exposed: Yes Necrotic Quality: Adherent Slough Tendon Exposed: No Muscle Exposed: No Joint Exposed: No Bone Exposed: No Periwound Skin Texture Texture Color No Abnormalities Noted: Yes No Abnormalities Noted: No Erythema: No Moisture No Abnormalities Noted: Yes Temperature / Pain Temperature: No Abnormality Treatment Notes Wound #3 (Ankle) Wound Laterality: Right, Lateral Cleanser Soap and Water Discharge Instruction: May shower and wash wound with dial antibacterial soap and water prior to dressing change. JOYFUL, SERRA ([id number]) [id number] Page 9 of 9 Peri-Wound Care Sween Lotion (Moisturizing lotion) Discharge Instruction: Apply moisturizing lotion as directed Topical Keystone Discharge Instruction: Apply as directed to wound Primary Dressing Santyl Ointment Discharge Instruction: Apply nickel thick amount to wound bed as instructed Secondary  Dressing T Non-adherent Dressing, 2x3 in elfa Discharge Instruction: Apply over primary dressing as directed. Woven Gauze Sponge, Non-Sterile 4x4 in Discharge Instruction: Apply over primary dressing as directed. Secured With [employer] Surgical T 2x10  (in/yd) ape Discharge Instruction: Secure with tape as directed. Compression Wrap CoFlex Calamine Unna Boot 4 x 6 (in/yd) Discharge Instruction: Apply Coflex [employer] as directed. Compression Stockings Add-Ons Electronic Signature(s) Signed: 01/16/2023 3:46:51 PM By: Adline Peals Entered By: Adline Peals on 01/16/2023 14:39:24 -------------------------------------------------------------------------------- Vitals Details Patient Name: Date of Service: [name] 01/16/2023 2:15 PM Medical Record Number: [id number] Patient Account Number: [id number] Date of Birth/Sex: Treating RN: 10-30-1946 (77 y.o. F) Primary Care Daya Dutt: [name] Other Clinician: Referring Danea Manter: Treating Vala Raffo/Extender: Hattie Perch in Treatment: 24 Vital Signs Time Taken: 02:26 Temperature (F): 98.0 Height (in): 63 Pulse (bpm): 77 Weight (lbs): 200 Respiratory Rate (breaths/min): 20 Body Mass Index (BMI): 35.4 Blood Pressure (mmHg): 177/92 Reference Range: 80 - 120 mg / dl Electronic Signature(s) Signed: 01/16/2023 4:14:01 PM By: Worthy Rancher Entered By: Worthy Rancher on 01/16/2023 14:27:12

## 2023-01-23 ENCOUNTER — Ambulatory Visit (HOSPITAL_BASED_OUTPATIENT_CLINIC_OR_DEPARTMENT_OTHER): Payer: Medicare Other | Admitting: General Surgery

## 2023-01-29 ENCOUNTER — Other Ambulatory Visit: Payer: Self-pay | Admitting: Family Medicine

## 2023-01-29 NOTE — Telephone Encounter (Signed)
Name of Medication: Lyrica Name of Pharmacy: [facility] [id number] [place] or Written Date and Quantity: 12/30/22, #60 Last Office Visit and Type: 11/16/22, cough f/u Next Office Visit and Type: none Last Controlled Substance Agreement Date: none Last UDS: none

## 2023-01-29 NOTE — Telephone Encounter (Signed)
ERx 

## 2023-01-30 ENCOUNTER — Encounter (HOSPITAL_BASED_OUTPATIENT_CLINIC_OR_DEPARTMENT_OTHER): Payer: Medicare Other | Admitting: General Surgery

## 2023-01-30 DIAGNOSIS — L97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d: Secondary | ICD-10-CM | POA: Diagnosis not present

## 2023-01-30 DIAGNOSIS — G629 Polyneuropathy, unspecified: Secondary | ICD-10-CM | POA: Diagnosis not present

## 2023-01-30 DIAGNOSIS — G4733 Obstructive sleep apnea (adult) (pediatric): Secondary | ICD-10-CM | POA: Diagnosis not present

## 2023-01-30 DIAGNOSIS — L03115 Cellulitis of right lower limb: Secondary | ICD-10-CM | POA: Diagnosis not present

## 2023-01-30 DIAGNOSIS — I872 Venous insufficiency (chronic) (peripheral): Secondary | ICD-10-CM | POA: Diagnosis not present

## 2023-01-30 DIAGNOSIS — Q969 Turner's syndrome, unspecified: Secondary | ICD-10-CM | POA: Diagnosis not present

## 2023-01-30 DIAGNOSIS — L97222 Non-pressure chronic ulcer of left calf with fat layer exposed: Secondary | ICD-10-CM | POA: Diagnosis not present

## 2023-01-30 DIAGNOSIS — L97818 Non-pressure chronic ulcer of other part of right lower leg with other specified severity: Secondary | ICD-10-CM | POA: Diagnosis not present

## 2023-01-30 DIAGNOSIS — L97312 Non-pressure chronic ulcer of right ankle with fat layer exposed: Secondary | ICD-10-CM | POA: Diagnosis not present

## 2023-01-31 NOTE — Progress Notes (Signed)
[name] [id number] Page 1 of 8 Visit Report for 01/30/2023 Arrival Information Details Patient Name: Date of Service: [name], [name] 01/30/2023 2:15 PM Medical Record Number: [id number] Patient Account Number: [id number] Date of Birth/Sex: Treating RN: [birth year] (77 y.o. [name] Primary Care Ernesteen Mihalic: [name] Other Clinician: Referring Christino Mcglinchey: Treating Abem Shaddix/Extender: [name] Session in Treatment: 26 Visit Information History Since Last Visit Added or deleted any medications: No Patient Arrived: Ambulatory Any new allergies or adverse reactions: No Arrival Time: 14:21 Had a fall or experienced change in No Accompanied By: self activities of daily living that may affect Transfer Assistance: None risk of falls: Patient Identification Verified: Yes Signs or symptoms of abuse/neglect since last visito No Patient Requires Transmission-Based Precautions: No Hospitalized since last visit: No Patient Has Alerts: No Implantable device outside of the clinic excluding No cellular tissue based products placed in the center since last visit: Has Dressing in Place as Prescribed: Yes Has Compression in Place as Prescribed: Yes Pain Present Now: No Electronic Signature(s) Signed: 01/30/2023 6:13:20 PM By: Dellie Catholic RN Entered By: Dellie Catholic on 01/30/2023 14:41:52 -------------------------------------------------------------------------------- Compression Therapy Details Patient Name: Date of Service: [name], [name] 01/30/2023 2:15 PM Medical Record Number: [id number] Patient Account Number: [id number] Date of Birth/Sex: Treating RN: [birth year] (77 y.o. [name] Primary Care Armentha Branagan: [name] Other Clinician: Referring Romel Dumond: Treating Charleene Callegari/Extender: [name] Session in Treatment: 26 Compression Therapy Performed for Wound  Assessment: Wound #2 Left,Posterior Lower Leg Performed By: Clinician Dellie Catholic, RN Compression Type: Double Layer Post Procedure Diagnosis Same as Pre-procedure Notes Calamine Coflex Electronic Signature(s) Signed: 01/30/2023 6:13:20 PM By: Dellie Catholic RN Entered By: Dellie Catholic on 01/30/2023 14:56:23 -------------------------------------------------------------------------------- Compression Therapy Details Patient Name: Date of Service: [name], [name] 01/30/2023 2:15 PM Medical Record Number: [id number] Patient Account Number: [id number] Date of Birth/Sex: Treating RN: 27-Mar-1946 (77 y.o. [name] Primary Care Janeshia Ciliberto: [name] Other Clinician: Referring Alianah Lofton: Treating Madisson Kulaga/Extender: Pixie Casino Weeks in Treatment: 26 Compression Therapy Performed for Wound Assessment: Wound #3 Right,Lateral Ankle Performed By: Clinician Dellie Catholic, RN Compression Type: Double Margaretmary Lombard ([id number]) [id number] Page 2 of 8 Post Procedure Diagnosis Same as Pre-procedure Electronic Signature(s) Signed: 01/30/2023 6:13:20 PM By: Dellie Catholic RN Entered By: Dellie Catholic on 01/30/2023 14:56:42 -------------------------------------------------------------------------------- Encounter Discharge Information Details Patient Name: Date of Service: [name], [name] 01/30/2023 2:15 PM Medical Record Number: [id number] Patient Account Number: [id number] Date of Birth/Sex: Treating RN: Nov 12, 1945 (77 y.o. [name] Primary Care Alonnie Bieker: [name] Other Clinician: Referring Laretta [name]: Treating Daneesha Quinteros/Extender: [name] Session in Treatment: 26 Encounter Discharge Information Items Post Procedure Vitals Discharge Condition: Stable Temperature (F): 98 Ambulatory Status: Ambulatory Pulse (bpm): 72 Discharge Destination: Home Respiratory Rate  (breaths/min): 18 Transportation: Private Auto Blood Pressure (mmHg): 138/84 Accompanied By: self Schedule Follow-up Appointment: Yes Clinical Summary of Care: Patient Declined Electronic Signature(s) Signed: 01/30/2023 6:13:20 PM By: Dellie Catholic RN Entered By: Dellie Catholic on 01/30/2023 17:51:00 -------------------------------------------------------------------------------- Lower Extremity Assessment Details Patient Name: Date of Service: [name], [name] 01/30/2023 2:15 PM Medical Record Number: [id number] Patient Account Number: [id number] Date of Birth/Sex: Treating RN: 08/10/1946 (77 y.o. [name] Primary Care Muriel Wilber: [name] Other Clinician: Referring Kirkland Figg: Treating Gazella Anglin/Extender: Pixie Casino Weeks in Treatment: 26 Edema Assessment Assessed: [Left: No] [Right: No] Edema: [Left: Ye] [Right: s] Calf Left: Right: Point of Measurement: From Medial Instep 39 cm 37.2 cm Ankle Left:  Right: Point of Measurement: From Medial Instep 24 cm 23 cm Vascular Assessment Pulses: Dorsalis Pedis Palpable: [Left:Yes] [Right:Yes] Electronic Signature(s) Signed: 01/30/2023 6:13:20 PM By: Dellie Catholic RN Entered By: Dellie Catholic on 01/30/2023 14:45:41 [place], [name] [id number] Page 3 of 8 -------------------------------------------------------------------------------- Multi Wound Chart Details Patient Name: Date of Service: [name], [name] 01/30/2023 2:15 PM Medical Record Number: [id number] Patient Account Number: [id number] Date of Birth/Sex: Treating RN: 12-06-1945 (77 y.o. F) Primary Care Litzy Dicker: [name] Other Clinician: Referring Ossie Yebra: Treating Ryne Mctigue/Extender: [name] Session in Treatment: 26 Vital Signs Height(in): 59 Pulse(bpm): 66 Weight(lbs): 200 Blood Pressure(mmHg): 138/84 Body Mass Index(BMI): 35.4 Temperature(F):  98 Respiratory Rate(breaths/min): 18 [2:Photos:] [N/A:N/A] Left, Posterior Lower Leg Right, Lateral Ankle N/A Wound Location: Blister Contusion/Bruise N/A Wounding Event: Venous Leg Ulcer Venous Leg Ulcer N/A Primary Etiology: Cataracts, Asthma, Hypertension, Cataracts, Asthma, Hypertension, N/A Comorbid History: Neuropathy, Received Chemotherapy, Neuropathy, Received Chemotherapy, Received Radiation Received Radiation 07/06/2022 10/19/2022 N/A Date Acquired: 26 14 N/A Weeks of Treatment: Open Open N/A Wound Status: No No N/A Wound Recurrence: 5x2.6x0.1 0.2x0.2x0.1 N/A Measurements L x W x D (cm) 10.21 0.031 N/A A (cm) : rea 1.021 0.003 N/A Volume (cm) : -48.60% 98.80% N/A % Reduction in A rea: -48.60% 98.90% N/A % Reduction in Volume: Full Thickness Without Exposed Full Thickness Without Exposed N/A Classification: Support Structures Support Structures Medium Medium N/A Exudate A mount: Serosanguineous Serosanguineous N/A Exudate Type: red, brown red, brown N/A Exudate Color: Distinct, outline attached Flat and Intact N/A Wound Margin: Medium (34-66%) Small (1-33%) N/A Granulation A mount: Pink Pink N/A Granulation Quality: Medium (34-66%) Large (67-100%) N/A Necrotic A mount: Eschar, Adherent Slough Eschar, Adherent Slough N/A Necrotic Tissue: Fat Layer (Subcutaneous Tissue): Yes Fat Layer (Subcutaneous Tissue): Yes N/A Exposed Structures: Fascia: No Fascia: No Tendon: No Tendon: No Muscle: No Muscle: No Joint: No Joint: No Bone: No Bone: No Small (1-33%) Medium (34-66%) N/A Epithelialization: Debridement - Excisional Debridement - Selective/Open Wound N/A Debridement: Pre-procedure Verification/Time Out 14:49 14:49 N/A Taken: Lidocaine 5% topical ointment Lidocaine 5% topical ointment N/A Pain Control: Subcutaneous, Slough Necrotic/Eschar N/A Tissue Debrided: Skin/Subcutaneous Tissue Non-Viable Tissue N/A Level: 13 0.04 N/A Debridement A  (sq cm): rea Curette Curette N/A Instrument: Minimum Minimum N/A Bleeding: Pressure Pressure N/A Hemostasis A chieved: 0 0 N/A Procedural Pain: 0 0 N/A Post Procedural Pain: Procedure was tolerated well Procedure was tolerated well N/A Debridement Treatment Response: 5x2.6x0.1 0.2x0.2x0.1 N/A Post Debridement Measurements L x W x D (cm) 1.021 0.003 N/A Post Debridement Volume: (cm) Scarring: Yes Excoriation: Yes N/A Periwound Skin Texture: Maceration: Yes N/A Periwound Skin Moisture: Rubor: No Erythema: No N/A Periwound Skin Color: No Abnormality No Abnormality N/A Temperature: Yes N/A N/A Tenderness on Palpation: Compression Therapy Compression Therapy N/A Procedures Performed: MARISSIA, GRATHWOHL ([id number]) [id number] Page 4 of 8 Debridement Debridement Treatment Notes Electronic Signature(s) Signed: 01/30/2023 3:10:59 PM By: Fredirick Maudlin MD FACS Entered By: Fredirick Maudlin on 01/30/2023 15:10:58 -------------------------------------------------------------------------------- Multi-Disciplinary Care Plan Details Patient Name: Date of Service: [name], [name] 01/30/2023 2:15 PM Medical Record Number: [id number] Patient Account Number: [id number] Date of Birth/Sex: Treating RN: 12-11-1945 (77 y.o. [name] Primary Care Yanil Dawe: [name] Other Clinician: Referring Dai Apel: Treating Chrisette Man/Extender: [name] Session in Treatment: 26 Active Inactive Wound/Skin Impairment Nursing Diagnoses: Impaired tissue integrity Goals: Ulcer/skin breakdown will have a volume reduction of 30% by week 4 Date Initiated: 08/03/2022 Date Inactivated: 09/10/2022 Target Resolution Date: 08/31/2022 Unmet Reason: uncontrolled edema, Goal Status:  Unmet acute infection Ulcer/skin breakdown will have a volume reduction of 50% by week 8 Date Initiated: 09/10/2022 Target Resolution Date: 04/05/2023 Goal Status:  Active Interventions: Assess ulceration(s) every visit Treatment Activities: Skin care regimen initiated : 08/03/2022 Notes: Keystone ordered. 10/30 RX Levo started. 09/10/22 Electronic Signature(s) Signed: 01/30/2023 6:13:20 PM By: Dellie Catholic RN Entered By: Dellie Catholic on 01/30/2023 17:49:46 -------------------------------------------------------------------------------- Pain Assessment Details Patient Name: Date of Service: [name], [name] 01/30/2023 2:15 PM Medical Record Number: [id number] Patient Account Number: [id number] Date of Birth/Sex: Treating RN: 11/07/1945 (77 y.o. [name] Primary Care Erma Joubert: [name] Other Clinician: Referring Derrick Orris: Treating Bassam Dresch/Extender: [name] Session in Treatment: 26 Active Problems Location of Pain Severity and Description of Pain Patient Has Paino No Site Locations ADELAYDA, CRITZ ([id number]) [id number] Page 5 of 8 Pain Management and Medication Current Pain Management: Electronic Signature(s) Signed: 01/30/2023 6:13:20 PM By: Dellie Catholic RN Entered By: Dellie Catholic on 01/30/2023 14:42:22 -------------------------------------------------------------------------------- Patient/Caregiver Education Details Patient Name: Date of Service: [name] 3/27/2024andnbsp2:15 PM Medical Record Number: [id number] Patient Account Number: [id number] Date of Birth/Gender: Treating RN: 09/13/1946 (77 y.o. [name] Primary Care Physician: [name] Other Clinician: Referring Physician: Treating Physician/Extender: [name] Session in Treatment: 26 Education Assessment Education Provided To: Patient Education Topics Provided Wound/Skin Impairment: Methods: Explain/Verbal Responses: Return demonstration correctly Electronic Signature(s) Signed: 01/30/2023 6:13:20 PM By: Dellie Catholic RN Entered By: Dellie Catholic on 01/30/2023 17:50:00 -------------------------------------------------------------------------------- Wound Assessment Details Patient Name: Date of Service: [name], [name] 01/30/2023 2:15 PM Medical Record Number: [id number] Patient Account Number: [id number] Date of Birth/Sex: Treating RN: [birth year] (77 y.o. [name] Primary Care Tobias Avitabile: [name] Other Clinician: Referring Nakia Remmers: Treating Candies Palm/Extender: Pixie Casino Weeks in Treatment: 26 Wound Status Wound Number: 2 Primary Venous Leg Ulcer Etiology: Wound Location: Left, Posterior Lower Leg Wound Open Wounding Event: Blister Status: Date Acquired: 07/06/2022 Comorbid Cataracts, Asthma, Hypertension, Neuropathy, Received Weeks Of Treatment: 26 History: Chemotherapy, Received Radiation Clustered Wound: No [name], [name] [id number] Page 6 of 8 Photos Wound Measurements Length: (cm) 5 Width: (cm) 2.6 Depth: (cm) 0.1 Area: (cm) 10.21 Volume: (cm) 1.021 % Reduction in Area: -48.6% % Reduction in Volume: -48.6% Epithelialization: Small (1-33%) Tunneling: No Undermining: No Wound Description Classification: Full Thickness Without Exposed Support Structures Wound Margin: Distinct, outline attached Exudate Amount: Medium Exudate Type: Serosanguineous Exudate Color: red, brown Foul Odor After Cleansing: No Slough/Fibrino Yes Wound Bed Granulation Amount: Medium (34-66%) Exposed Structure Granulation Quality: Pink Fascia Exposed: No Necrotic Amount: Medium (34-66%) Fat Layer (Subcutaneous Tissue) Exposed: Yes Necrotic Quality: Eschar, Adherent Slough Tendon Exposed: No Muscle Exposed: No Joint Exposed: No Bone Exposed: No Periwound Skin Texture Texture Color No Abnormalities Noted: No No Abnormalities Noted: No Scarring: Yes Rubor: No Moisture Temperature / Pain No Abnormalities Noted: No Temperature: No  Abnormality Tenderness on Palpation: Yes Treatment Notes Wound #2 (Lower Leg) Wound Laterality: Left, Posterior Cleanser Soap and Water Discharge Instruction: May shower and wash wound with dial antibacterial soap and water prior to dressing change. Peri-Wound Care Sween Lotion (Moisturizing lotion) Discharge Instruction: Apply moisturizing lotion as directed Topical Keystone Discharge Instruction: Apply as directed to wound Primary Dressing Santyl Ointment Discharge Instruction: Apply nickel thick amount to wound bed as instructed Secondary Dressing T Non-adherent Dressing, 2x3 in elfa Discharge Instruction: Apply over primary dressing as directed. Zetuvit Plus 4x8 in Discharge Instruction: Apply over primary dressing as directed. ALEYANA, MANCILLA ([id number]) [id number] Page 7  of 8 Secured With 69M Medipore Public affairs consultant Surgical T 2x10 (in/yd) ape Discharge Instruction: Secure with tape as directed. Compression Wrap CoFlex Calamine Unna Boot 4 x 6 (in/yd) Discharge Instruction: Apply Coflex Calamine [employer] as directed. Compression Stockings Add-Ons Electronic Signature(s) Signed: 01/30/2023 6:13:20 PM By: Dellie Catholic RN Entered By: Dellie Catholic on 01/30/2023 14:38:04 -------------------------------------------------------------------------------- Wound Assessment Details Patient Name: Date of Service: TASSIE, GURSKI 01/30/2023 2:15 PM Medical Record Number: [id number] Patient Account Number: [id number] Date of Birth/Sex: Treating RN: [birth year] (77 y.o. [name] Primary Care Fartun Paradiso: [name] Other Clinician: Referring Felipe Cabell: Treating Sheridyn Canino/Extender: Pixie Casino Weeks in Treatment: 26 Wound Status Wound Number: 3 Primary Venous Leg Ulcer Etiology: Wound Location: Right, Lateral Ankle Wound Open Wounding Event: Contusion/Bruise Status: Date Acquired: 10/19/2022 Comorbid Cataracts,  Asthma, Hypertension, Neuropathy, Received Weeks Of Treatment: 14 History: Chemotherapy, Received Radiation Clustered Wound: No Photos Wound Measurements Length: (cm) 0.2 Width: (cm) 0.2 Depth: (cm) 0.1 Area: (cm) 0.031 Volume: (cm) 0.003 % Reduction in Area: 98.8% % Reduction in Volume: 98.9% Epithelialization: Medium (34-66%) Tunneling: No Undermining: No Wound Description Classification: Full Thickness Without Exposed Suppor Wound Margin: Flat and Intact Exudate Amount: Medium Exudate Type: Serosanguineous Exudate Color: red, brown t Structures Foul Odor After Cleansing: No Slough/Fibrino Yes Wound Bed Granulation Amount: Small (1-33%) Exposed Structure Granulation Quality: Pink Fascia Exposed: No Necrotic Amount: Large (67-100%) Fat Layer (Subcutaneous Tissue) Exposed: Yes Necrotic Quality: Eschar, Adherent Slough Tendon Exposed: No Muscle Exposed: No Joint Exposed: No Bone Exposed: No MARGARIDA, MULLANE ([id number]) [id number] Page 8 of 8 Periwound Skin Texture Texture Color No Abnormalities Noted: Yes No Abnormalities Noted: No Erythema: No Moisture No Abnormalities Noted: Yes Temperature / Pain Temperature: No Abnormality Treatment Notes Wound #3 (Ankle) Wound Laterality: Right, Lateral Cleanser Soap and Water Discharge Instruction: May shower and wash wound with dial antibacterial soap and water prior to dressing change. Peri-Wound Care Sween Lotion (Moisturizing lotion) Discharge Instruction: Apply moisturizing lotion as directed Topical Keystone Discharge Instruction: Apply as directed to wound Primary Dressing Secondary Dressing T Non-adherent Dressing, 2x3 in elfa Discharge Instruction: Apply over primary dressing as directed. Woven Gauze Sponge, Non-Sterile 4x4 in Discharge Instruction: Apply over primary dressing as directed. Secured With [employer] Surgical T 2x10 (in/yd) ape Discharge Instruction:  Secure with tape as directed. Compression Wrap CoFlex Calamine Unna Boot 4 x 6 (in/yd) Discharge Instruction: Apply Coflex Calamine [employer] as directed. Compression Stockings Add-Ons Electronic Signature(s) Signed: 01/30/2023 6:13:20 PM By: Dellie Catholic RN Entered By: Dellie Catholic on 01/30/2023 14:38:33 -------------------------------------------------------------------------------- Vitals Details Patient Name: Date of Service: [name] 01/30/2023 2:15 PM Medical Record Number: [id number] Patient Account Number: [id number] Date of Birth/Sex: Treating RN: [name] 13, 1946 (77 y.o. [name] Primary Care Keontae Levingston: [name] Other Clinician: Referring Xochil Shanker: Treating [name] [name]/Extender: [name] Session in Treatment: 26 Vital Signs Time Taken: 14:21 Temperature (F): 98 Height (in): 63 Pulse (bpm): 72 Weight (lbs): 200 Respiratory Rate (breaths/min): 18 Body Mass Index (BMI): 35.4 Blood Pressure (mmHg): 138/84 Reference Range: 80 - 120 mg / dl Electronic Signature(s) Signed: 01/30/2023 6:13:20 PM By: Dellie Catholic RN Entered By: Dellie Catholic on 01/30/2023 14:42:16

## 2023-01-31 NOTE — Progress Notes (Signed)
LORNE, [name] [id number] Page 1 of 12 Visit Report for 01/30/2023 Chief Complaint Document Details Patient Name: Date of Service: [name], [name] 01/30/2023 2:15 PM Medical Record Number: [id number] Patient Account Number: [id number] Date of Birth/Sex: Treating RN: 1946-06-23 (77 y.o. F) Primary Care Provider: [name] Other Clinician: Referring Provider: Treating Provider/Extender: [name] Session in Treatment: 26 Information Obtained from: Patient Chief Complaint Patient presents for treatment of an open ulcer due to venous insufficiency Electronic Signature(s) Signed: 01/30/2023 3:11:05 PM By: Fredirick Maudlin MD FACS Entered By: Fredirick Maudlin on 01/30/2023 15:11:05 -------------------------------------------------------------------------------- Debridement Details Patient Name: Date of Service: [name], [name] 01/30/2023 2:15 PM Medical Record Number: [id number] Patient Account Number: [id number] Date of Birth/Sex: Treating RN: [birth year] (76 y.o. [name] Primary Care Provider: [name] Other Clinician: Referring Provider: Treating Provider/Extender: [name] Session in Treatment: 26 Debridement Performed for Assessment: Wound #2 Left,Posterior Lower Leg Performed By: Physician Fredirick Maudlin, MD Debridement Type: Debridement Severity of Tissue Pre Debridement: Fat layer exposed Level of Consciousness (Pre-procedure): Awake and Alert Pre-procedure Verification/Time Out Yes - 14:49 Taken: Start Time: 14:49 Pain Control: Lidocaine 5% topical ointment T Area Debrided (L x W): otal 5 (cm) x 2.6 (cm) = 13 (cm) Tissue and other material debrided: Non-Viable, Slough, Subcutaneous, Slough Level: Skin/Subcutaneous Tissue Debridement Description: Excisional Instrument: Curette Bleeding: Minimum Hemostasis Achieved: Pressure End Time: 14:50 Procedural Pain:  0 Post Procedural Pain: 0 Response to Treatment: Procedure was tolerated well Level of Consciousness (Post- Awake and Alert procedure): Post Debridement Measurements of Total Wound Length: (cm) 5 Width: (cm) 2.6 Depth: (cm) 0.1 Volume: (cm) 1.021 Character of Wound/Ulcer Post Debridement: Improved Severity of Tissue Post Debridement: Fat layer exposed Post Procedure Diagnosis Same as Pre-procedure Notes Scribed for Dr. Celine Ahr by MckeeScotton Electronic Signature(s) IRHA, ARNET ([id number]) [id number] Page 2 of 12 Signed: 01/30/2023 4:43:02 PM By: Fredirick Maudlin MD FACS Signed: 01/30/2023 6:13:20 PM By: Dellie Catholic RN Entered By: Dellie Catholic on 01/30/2023 14:55:49 -------------------------------------------------------------------------------- Debridement Details Patient Name: Date of Service: [name], [name] 01/30/2023 2:15 PM Medical Record Number: [id number] Patient Account Number: [id number] Date of Birth/Sex: Treating RN: Apr 13, 1946 (77 y.o. [name] Primary Care Provider: [name] Other Clinician: Referring Provider: Treating Provider/Extender: [name] Session in Treatment: 26 Debridement Performed for Assessment: Wound #3 Right,Lateral Ankle Performed By: Physician Fredirick Maudlin, MD Debridement Type: Debridement Severity of Tissue Pre Debridement: Fat layer exposed Level of Consciousness (Pre-procedure): Awake and Alert Pre-procedure Verification/Time Out Yes - 14:49 Taken: Start Time: 14:49 Pain Control: Lidocaine 5% topical ointment T Area Debrided (L x W): otal 0.2 (cm) x 0.2 (cm) = 0.04 (cm) Tissue and other material debrided: Non-Viable, Eschar Level: Non-Viable Tissue Debridement Description: Selective/Open Wound Instrument: Curette Bleeding: Minimum Hemostasis Achieved: Pressure End Time: 14:50 Procedural Pain: 0 Post Procedural Pain: 0 Response to Treatment: Procedure  was tolerated well Level of Consciousness (Post- Awake and Alert procedure): Post Debridement Measurements of Total Wound Length: (cm) 0.2 Width: (cm) 0.2 Depth: (cm) 0.1 Volume: (cm) 0.003 Character of Wound/Ulcer Post Debridement: Improved Severity of Tissue Post Debridement: Fat layer exposed Post Procedure Diagnosis Same as Pre-procedure Notes Scribed for Dr. Celine Ahr by MckeeScotton Electronic Signature(s) Signed: 01/30/2023 4:43:02 PM By: Fredirick Maudlin MD FACS Signed: 01/30/2023 6:13:20 PM By: Dellie Catholic RN Entered By: Dellie Catholic on 01/30/2023 15:03:27 -------------------------------------------------------------------------------- HPI Details Patient Name: Date of Service: [name] 01/30/2023 2:15 PM Medical Record Number: [id number] Patient Account Number: [id number] Date  of Birth/Sex: Treating RN: October 07, 1946 (77 y.o. F) Primary Care Provider: [name] Other Clinician: Referring Provider: Treating Provider/Extender: [name] Session in Treatment: 26 History of Present Illness HPI Description: ADMISSION This is a 77 year old woman with a history of chronic venous insufficiency status post saphenous vein ablations in 2010 and 2016. 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 She presents today with an open venous ulcer on her left lower leg. It has been present for a little over 2 weeks. LAQUANA, VINDIOLA ([id number]) [id number] Page 3 of 12 She saw her primary care provider who apparently swabbed the wound and grew out Pseudomonas. She just completed a course of ciprofloxacin for this. ABI was 0.91. She reports that she has had previous issues with ulcers in this same location, stemming back to a punch biopsy taken by a dermatologist many years ago. She has had several skin substitutes applied to the area that have ultimately resulted in healing on prior occasions. She has 2  small ulcers on her left medial lower leg. There is slough accumulation in both of them. The more anterior of the 2 is quite small and has some soft slough on the surface, underneath which there is good granulation tissue. The more medial wound also has slough accumulation, but the underlying surface is fairly fibrotic and gritty. This is consistent with her provided history of multiple ulcers in the same location. 08/03/2022: The anterior wound is smaller today with just a little bit of slough accumulation. The more medial wound continues to be very sensitive and fibrotic with slough buildup. 08/13/2022: The anterior wound is closed. The more medial wound remains sensitive with a fairly fibrotic surface. There is more granulation tissue filling in, however, and there is less slough than on prior occasions. 08/20/2022: The anterior wound remains closed. The more medial wound has filled with granulation tissue but still has a fair amount of slough on the surface and remains fairly tender. Unfortunately, she has developed 2 small ulcers just proximal to this. The fat layer is exposed in each. She says that over the weekend, she felt a burning sensation in that location. 08/27/2022: The 2 small wounds proximal to the main wound have merged into a single site. The wounds look a little bit dry, but they are quite clean without significant slough accumulation. They remain quite tender. 09/03/2022: All of the wounds have now merged into 1 large wound. There is a strong odor coming from the wound and the surface is not particularly viable. It is extremely painful for her today. 09/10/2022: The wound is less black and purple this week but still does not look particularly viable. The surface is desiccated. The culture that I took last week returned with a polymicrobial population including Pseudomonas. I prescribed both Augmentin and levofloxacin. She has not yet picked up levofloxacin, as her pharmacy only  notified her yesterday that it was ready. We have ordered a Keystone topical compounded antibiotic, but it has not yet come. 09/17/2022: The wound surface is markedly improved today. There is still an area of grayish-looking muscle but the rest appears significantly more viable. There is still a layer of slough on the surface. She still has a couple days left of oral antibiotic therapy. She has her [employer] compounded topical antibiotic with her today. 09/24/2022: She has completed her oral antibiotic therapy. The wound surface is much cleaner today and more viable without any necrotic tissue. It is a bit desiccated, however. 10/01/2022: The  moisture balance of the wound has improved. There is a layer of yellow slough on the surface, but beneath this, the wound is more pink. 10/08/2022: The wound is smaller and cleaner today with a layer of slough present. She is having less pain. 12/18; this patient has a new wound on the right lateral ankle which apparently started after a cat scratched her. Secondarily she managed to drop a cake pan on the same area. This is very painful. The original wound was on the left posterior calf we have been using [employer] under an [employer]. The Unna boot fell down and apparently she was in for a nurse visit perhaps last week and that 1 fell down as well This patient has central venous mediated hypertension. For member correctly she has a stent placed in the left common iliac artery by Dr. Donzetta Matters some years ago [facility] Thurner syndrome] she also has had venous ablations and I believe a history of a DVT The patient has compression pumps at home but she does not use them 10/30/2022: The patient says that her wounds burned all week with the [facility] classic in place. She has a fair amount of slough and nonviable subcutaneous tissue present on the right ankle. The left posterior calf wound is cleaner than I have seen it to date. Both remain fairly tender. 11/14/2022:  Both wounds have substantial slough and eschar accumulation. They remain extremely tender. 11/21/2022: The wounds are cleaner this week, but still have slough and eschar accumulation. She continues to describe burning pain in both wounds, but upon further questioning, she actually has burning in both of her feet that radiates up her legs and includes to the wounds. 11/28/2022: Both wounds have slough and eschar accumulation, as well as adherent silver alginate that was difficult to remove. She continues to have pain out of proportion to the extent of her wounds. Her PCP did initiate Lyrica which she started taking last night. The dose is quite low, only 25 mg, but apparently there is a plan for upward titration, assuming she tolerates the medication. 12/05/2022: The wounds are both a little bit smaller today, but have significant slough accumulation, as usual. They remain exquisitely tender. She is now taking Lyrica twice a day. 12/19/2022: Both of her wounds look significantly better this week. There is slough buildup on both, but they are smaller. She seems to be getting a good response from Lyrica as she is having much less pain. 12/26/2022: The wound on her right lateral ankle is nearly closed. The wound on her left posterior calf is smaller but still has a lot of slough accumulation. They remain tender. 01/02/2023: The right lateral ankle wound is down to just a couple of millimeters. It is much less tender than on prior occasions. There is minimal slough buildup. The wound on her posterior calf continues to contract, as well. It has thick slough buildup and remains fairly tender. 01/09/2023: The right lateral ankle wound is nearly closed. He remains tender. There is just a little eschar on the surface. The wound on her posterior calf has improved quite a bit. There is still slough on the surface, but the more proximal area has epithelialized substantially and there is no longer any gray discoloration  to her tissue. 3/13; patient presents for follow-up. Her right lateral ankle wound is almost healed. She has a wound to her left posterior calf with slough and granulation tissue. We have been using Santyl under Unna boots bilaterally to the lower  extremities. She has no issues or complaints today. 01/30/2023: The right lateral ankle wound is down to just a pinhole under a layer of eschar. The left posterior calf wound is quite a bit improved since the last time I saw it. There is still a layer of slough on the surface but there has been more epithelialization. Electronic Signature(s) Signed: 01/30/2023 3:11:44 PM By: Fredirick Maudlin MD FACS Entered By: Fredirick Maudlin on 01/30/2023 15:11:44 [name] [id number] Page 4 of 12 -------------------------------------------------------------------------------- Physical Exam Details Patient Name: Date of Service: [name], [name] 01/30/2023 2:15 PM Medical Record Number: [id number] Patient Account Number: [id number] Date of Birth/Sex: Treating RN: [birth year] (77 y.o. F) Primary Care Provider: [name] Other Clinician: Referring Provider: Treating Provider/Extender: [name] Session in Treatment: 26 Constitutional . . . . no acute distress. Respiratory Normal work of breathing on room air. Notes 01/30/2023: The right lateral ankle wound is down to just a pinhole under a layer of eschar. The left posterior calf wound is quite a bit improved since the last time I saw it. There is still a layer of slough on the surface but there has been more epithelialization. Electronic Signature(s) Signed: 01/30/2023 3:14:14 PM By: Fredirick Maudlin MD FACS Entered By: Fredirick Maudlin on 01/30/2023 15:14:14 -------------------------------------------------------------------------------- Physician Orders Details Patient Name: Date of Service: [name], [name] 01/30/2023 2:15 PM Medical  Record Number: [id number] Patient Account Number: [id number] Date of Birth/Sex: Treating RN: 08/03/1946 (77 y.o. [name] Primary Care Provider: [name] Other Clinician: Referring Provider: Treating Provider/Extender: [name] Session in Treatment: 26 Verbal / Phone Orders: No Diagnosis Coding ICD-10 Coding Code Description [id number] Non-pressure chronic ulcer of other part of left lower leg with fat layer exposed L97.818 Non-pressure chronic ulcer of other part of right lower leg with other specified severity I83.893 Varicose veins of bilateral lower extremities with other complications [id number] Venous insufficiency (chronic) (peripheral) G62.9 Polyneuropathy, unspecified R60.0 Localized edema L03.115 Cellulitis of right lower limb Follow-up Appointments ppointment in 1 week. - Dr. Celine Ahr RM 3 Return A Anesthetic Wound #2 Left,Posterior Lower Leg (In clinic) Topical Lidocaine 5% applied to wound bed - in clinic, prior to debridement (In clinic) Topical Lidocaine 4% applied to wound bed Bathing/ Shower/ Hygiene May shower with protection but do not get wound dressing(s) wet. Protect dressing(s) with water repellant cover (for example, large plastic bag) or a cast cover and may then take shower. Edema Control - Lymphedema / SCD / Other Left Lower Extremity Lymphedema Pumps. Use Lymphedema pumps on leg(s) 2-3 times a day for 45-60 minutes. If wearing any wraps or hose, do not remove them. Continue exercising as instructed. - Use x1 per day Avoid standing for long periods of time. Exercise regularly Wound Treatment Wound #2 - Lower Leg Wound Laterality: Left, Posterior Cleanser: Soap and Water 1 x Per Week/30 Days Discharge Instructions: May shower and wash wound with dial antibacterial soap and water prior to dressing change. YAGAIRA, ANZELONE ([id number]) [id number] Page 5 of 12 Peri-Wound Care: Sween Lotion  (Moisturizing lotion) 1 x Per Week/30 Days Discharge Instructions: Apply moisturizing lotion as directed Topical: Keystone 1 x Per Week/30 Days Discharge Instructions: Apply as directed to wound Prim Dressing: Santyl Ointment 1 x Per Week/30 Days ary Discharge Instructions: Apply nickel thick amount to wound bed as instructed Secondary Dressing: T Non-adherent Dressing, 2x3 in 1 x Per Week/30 Days elfa Discharge Instructions: Apply over primary dressing as directed. Secondary Dressing: Zetuvit Plus 4x8 in  1 x Per Week/30 Days Discharge Instructions: Apply over primary dressing as directed. Secured With: 70M Medipore Public affairs consultant Surgical T 2x10 (in/yd) 1 x Per Week/30 Days ape Discharge Instructions: Secure with tape as directed. Compression Wrap: CoFlex Calamine Unna Boot 4 x 6 (in/yd) 1 x Per Week/30 Days Discharge Instructions: Apply Coflex [employer] as directed. Wound #3 - Ankle Wound Laterality: Right, Lateral Cleanser: Soap and Water 1 x Per Week/30 Days Discharge Instructions: May shower and wash wound with dial antibacterial soap and water prior to dressing change. Peri-Wound Care: Sween Lotion (Moisturizing lotion) 1 x Per Week/30 Days Discharge Instructions: Apply moisturizing lotion as directed Topical: Keystone 1 x Per Week/30 Days Discharge Instructions: Apply as directed to wound Secondary Dressing: T Non-adherent Dressing, 2x3 in 1 x Per Week/30 Days elfa Discharge Instructions: Apply over primary dressing as directed. Secondary Dressing: Woven Gauze Sponge, Non-Sterile 4x4 in 1 x Per Week/30 Days Discharge Instructions: Apply over primary dressing as directed. Secured With: 70M Medipore Public affairs consultant Surgical T 2x10 (in/yd) 1 x Per Week/30 Days ape Discharge Instructions: Secure with tape as directed. Compression Wrap: CoFlex Calamine Unna Boot 4 x 6 (in/yd) 1 x Per Week/30 Days Discharge Instructions: Apply Coflex [employer] as directed. Electronic  Signature(s) Signed: 01/30/2023 4:43:02 PM By: Fredirick Maudlin MD FACS Entered By: Fredirick Maudlin on 01/30/2023 15:14:29 -------------------------------------------------------------------------------- Problem List Details Patient Name: Date of Service: [name], [name] 01/30/2023 2:15 PM Medical Record Number: [id number] Patient Account Number: [id number] Date of Birth/Sex: Treating RN: 09-Jun-1946 (77 y.o. F) Primary Care Provider: [name] Other Clinician: Referring Provider: Treating Provider/Extender: [name] Session in Treatment: 26 Active Problems ICD-10 Encounter Code Description Active Date MDM Diagnosis L97.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d9/22/2023 No Yes L97.818 Non-pressure chronic ulcer of other part of right lower leg with other specified 10/22/2022 No Yes severity AMBERDAWN, ROSENBURG ([id number]) [id number] Page 6 of 12 [phone] Varicose veins of bilateral lower extremities with other complications 0000000 No Yes I87.2 Venous insufficiency (chronic) (peripheral) 07/27/2022 No Yes G62.9 Polyneuropathy, unspecified 07/27/2022 No Yes R60.0 Localized edema 07/27/2022 No Yes L03.115 Cellulitis of right lower limb 10/22/2022 No Yes Inactive Problems Resolved Problems Electronic Signature(s) Signed: 01/30/2023 3:10:51 PM By: Fredirick Maudlin MD FACS Entered By: Fredirick Maudlin on 01/30/2023 15:10:50 -------------------------------------------------------------------------------- Progress Note Details Patient Name: Date of Service: [name] 01/30/2023 2:15 PM Medical Record Number: [id number] Patient Account Number: [id number] Date of Birth/Sex: Treating RN: 01-29-1946 (77 y.o. F) Primary Care Provider: [name] Other Clinician: Referring Provider: Treating Provider/Extender: [name] Session in Treatment:  26 Subjective Chief Complaint Information obtained from Patient Patient presents for treatment of an open ulcer due to venous insufficiency History of Present Illness (HPI) ADMISSION This is a 77 year old woman with a history of chronic venous insufficiency status post saphenous vein ablations in 2010 and 2016. 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 She presents today with an open venous ulcer on her left lower leg. It has been present for a little over 2 weeks. She saw her primary care provider who apparently swabbed the wound and grew out Pseudomonas. She just completed a course of ciprofloxacin for this. ABI was 0.91. She reports that she has had previous issues with ulcers in this same location, stemming back to a punch biopsy taken by a dermatologist many years ago. She has had several skin substitutes applied to the area that  have ultimately resulted in healing on prior occasions. She has 2 small ulcers on her left medial lower leg. There is slough accumulation in both of them. The more anterior of the 2 is quite small and has some soft slough on the surface, underneath which there is good granulation tissue. The more medial wound also has slough accumulation, but the underlying surface is fairly fibrotic and gritty. This is consistent with her provided history of multiple ulcers in the same location. 08/03/2022: The anterior wound is smaller today with just a little bit of slough accumulation. The more medial wound continues to be very sensitive and fibrotic with slough buildup. 08/13/2022: The anterior wound is closed. The more medial wound remains sensitive with a fairly fibrotic surface. There is more granulation tissue filling in, however, and there is less slough than on prior occasions. 08/20/2022: The anterior wound remains closed. The more medial wound has filled with granulation tissue but still has a fair amount of slough on the surface and remains  fairly tender. Unfortunately, she has developed 2 small ulcers just proximal to this. The fat layer is exposed in each. She says that over the weekend, she felt a burning sensation in that location. 08/27/2022: The 2 small wounds proximal to the main wound have merged into a single site. The wounds look a little bit dry, but they are quite clean without significant slough accumulation. They remain quite tender. 09/03/2022: All of the wounds have now merged into 1 large wound. There is a strong odor coming from the wound and the surface is not particularly viable. It is extremely painful for her today. 09/10/2022: The wound is less black and purple this week but still does not look particularly viable. The surface is desiccated. The culture that I took last week returned with a polymicrobial population including Pseudomonas. I prescribed both Augmentin and levofloxacin. She has not yet picked up levofloxacin, as her pharmacy only notified her yesterday that it was ready. We have ordered a Keystone topical compounded antibiotic, but it has not yet come. [name], [name] [id number] Page 7 of 12 09/17/2022: The wound surface is markedly improved today. There is still an area of grayish-looking muscle but the rest appears significantly more viable. There is still a layer of slough on the surface. She still has a couple days left of oral antibiotic therapy. She has her [employer] compounded topical antibiotic with her today. 09/24/2022: She has completed her oral antibiotic therapy. The wound surface is much cleaner today and more viable without any necrotic tissue. It is a bit desiccated, however. 10/01/2022: The moisture balance of the wound has improved. There is a layer of yellow slough on the surface, but beneath this, the wound is more pink. 10/08/2022: The wound is smaller and cleaner today with a layer of slough present. She is having less pain. 12/18; this  patient has a new wound on the right lateral ankle which apparently started after a cat scratched her. Secondarily she managed to drop a cake pan on the same area. This is very painful. The original wound was on the left posterior calf we have been using [employer] under an [employer]. The Unna boot fell down and apparently she was in for a nurse visit perhaps last week and that 1 fell down as well This patient has central venous mediated hypertension. For member correctly she has a stent placed in the left common iliac artery by Dr. Donzetta Matters some years ago [name] Bushy syndrome] she  also has had venous ablations and I believe a history of a DVT The patient has compression pumps at home but she does not use them 10/30/2022: The patient says that her wounds burned all week with the [facility] classic in place. She has a fair amount of slough and nonviable subcutaneous tissue present on the right ankle. The left posterior calf wound is cleaner than I have seen it to date. Both remain fairly tender. 11/14/2022: Both wounds have substantial slough and eschar accumulation. They remain extremely tender. 11/21/2022: The wounds are cleaner this week, but still have slough and eschar accumulation. She continues to describe burning pain in both wounds, but upon further questioning, she actually has burning in both of her feet that radiates up her legs and includes to the wounds. 11/28/2022: Both wounds have slough and eschar accumulation, as well as adherent silver alginate that was difficult to remove. She continues to have pain out of proportion to the extent of her wounds. Her PCP did initiate Lyrica which she started taking last night. The dose is quite low, only 25 mg, but apparently there is a plan for upward titration, assuming she tolerates the medication. 12/05/2022: The wounds are both a little bit smaller today, but have significant slough accumulation, as usual. They remain exquisitely tender. She  is now taking Lyrica twice a day. 12/19/2022: Both of her wounds look significantly better this week. There is slough buildup on both, but they are smaller. She seems to be getting a good response from Lyrica as she is having much less pain. 12/26/2022: The wound on her right lateral ankle is nearly closed. The wound on her left posterior calf is smaller but still has a lot of slough accumulation. They remain tender. 01/02/2023: The right lateral ankle wound is down to just a couple of millimeters. It is much less tender than on prior occasions. There is minimal slough buildup. The wound on her posterior calf continues to contract, as well. It has thick slough buildup and remains fairly tender. 01/09/2023: The right lateral ankle wound is nearly closed. He remains tender. There is just a little eschar on the surface. The wound on her posterior calf has improved quite a bit. There is still slough on the surface, but the more proximal area has epithelialized substantially and there is no longer any gray discoloration to her tissue. 3/13; patient presents for follow-up. Her right lateral ankle wound is almost healed. She has a wound to her left posterior calf with slough and granulation tissue. We have been using Santyl under Unna boots bilaterally to the lower extremities. She has no issues or complaints today. 01/30/2023: The right lateral ankle wound is down to just a pinhole under a layer of eschar. The left posterior calf wound is quite a bit improved since the last time I saw it. There is still a layer of slough on the surface but there has been more epithelialization. Patient History Information obtained from Patient. Family History Cancer - Mother,Paternal Grandparents, Diabetes - Siblings, Heart Disease - Father, Hypertension - Father. Social History Former smoker - quit in 1967. Medical History Eyes Patient has history of Cataracts - L eye Ear/Nose/Mouth/Throat Denies history of Chronic  sinus problems/congestion, Middle ear problems Hematologic/Lymphatic Denies history of Anemia, Human Immunodeficiency Virus, Lymphedema Respiratory Patient has history of Asthma Cardiovascular Patient has history of Hypertension Endocrine Denies history of Type I Diabetes, Type II Diabetes Neurologic Patient has history of Neuropathy - Feet and finger Denies history of Seizure  Disorder Oncologic Patient has history of Received Chemotherapy - 2012, Received Radiation - 2012 Hospitalization/Surgery History - Lower extremity venography 2020. - Intravascular ultrasound. - peripheral vascular intervention. - melanoma 2011. 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 Vitals Time Taken: 2:21 PM, Height: 63 in, Weight: 200 lbs, BMI: 35.4, Temperature: 98 F, Pulse: 72 bpm, Respiratory Rate: 18 breaths/min, Blood Pressure: 138/84 mmHg. Respiratory Normal work of breathing on room air. General Notes: 01/30/2023: The right lateral ankle wound is down to just a pinhole under a layer of eschar. The left posterior calf wound is quite a bit improved since the last time I saw it. There is still a layer of slough on the surface but there has been more epithelialization. Integumentary (Hair, Skin) Wound #2 status is Open. Original cause of wound was Blister. The date acquired was: 07/06/2022. The wound has been in treatment 26 weeks. The wound is located on the Left,Posterior Lower Leg. The wound measures 5cm length x 2.6cm width x 0.1cm depth; 10.21cm^2 area and 1.021cm^3 volume. There is Fat Layer (Subcutaneous Tissue) exposed. There is no tunneling or undermining  noted. There is a medium amount of serosanguineous drainage noted. The wound margin is distinct with the outline attached to the wound base. There is medium (34-66%) pink granulation within the wound bed. There is a medium (34-66%) amount of necrotic tissue within the wound bed including Eschar and Adherent Slough. The periwound skin appearance exhibited: Scarring. The periwound skin appearance did not exhibit: Rubor. Periwound temperature was noted as No Abnormality. The periwound has tenderness on palpation. Wound #3 status is Open. Original cause of wound was Contusion/Bruise. The date acquired was: 10/19/2022. The wound has been in treatment 14 weeks. The wound is located on the Right,Lateral Ankle. The wound measures 0.2cm length x 0.2cm width x 0.1cm depth; 0.031cm^2 area and 0.003cm^3 volume. There is Fat Layer (Subcutaneous Tissue) exposed. There is no tunneling or undermining noted. There is a medium amount of serosanguineous drainage noted. The wound margin is flat and intact. There is small (1-33%) pink granulation within the wound bed. There is a large (67-100%) amount of necrotic tissue within the wound bed including Eschar and Adherent Slough. The periwound skin appearance had no abnormalities noted for texture. The periwound skin appearance had no abnormalities noted for moisture. The periwound skin appearance did not exhibit: Erythema. Periwound temperature was noted as No Abnormality. 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 There was a Excisional Skin/Subcutaneous Tissue Debridement with a total area of 13 sq cm performed by Fredirick Maudlin, MD. With the following instrument(s): Curette to remove Non-Viable tissue/material. Material removed includes Subcutaneous Tissue and Slough and after achieving pain control using Lidocaine 5% topical ointment. No specimens were taken. A time out was conducted at 14:49, prior to the start of the procedure. A Minimum amount of bleeding  was controlled with Pressure. The procedure was tolerated well with a pain level of 0 throughout and a pain level of 0 following the procedure. Post Debridement Measurements: 5cm length x 2.6cm width x 0.1cm depth; 1.021cm^3 volume. Character of Wound/Ulcer Post Debridement is improved. Severity of Tissue Post Debridement is: Fat layer exposed. Post procedure Diagnosis Wound #2: Same as Pre-Procedure General Notes: Scribed for Dr. Celine Ahr by MckeeScotton. Pre-procedure diagnosis of Wound #2 is a Venous Leg Ulcer located on the Left,Posterior Lower Leg . There was a Double Layer Compression Therapy Procedure by Dellie Catholic, RN. Post procedure Diagnosis Wound #2: Same as Pre-Procedure Notes: Calamine Coflex. [name], [name] [id number] Page 9 of 12 Wound #3 Pre-procedure diagnosis of Wound #3 is a Venous Leg Ulcer located on the Right,Lateral Ankle .Severity of Tissue Pre Debridement is: Fat layer exposed. There was a Selective/Open Wound Non-Viable Tissue Debridement with a total area of 0.04 sq cm performed by Fredirick Maudlin, MD. With the following instrument(s): Curette to remove Non-Viable tissue/material. Material removed includes Eschar after achieving pain control using Lidocaine 5% topical ointment. No specimens were taken. A time out was conducted at 14:49, prior to the start of the procedure. A Minimum amount of  bleeding was controlled with Pressure. The procedure was tolerated well with a pain level of 0 throughout and a pain level of 0 following the procedure. Post Debridement Measurements: 0.2cm length x 0.2cm width x 0.1cm depth; 0.003cm^3 volume. Character of Wound/Ulcer Post Debridement is improved. Severity of Tissue Post Debridement is: Fat layer exposed. Post procedure Diagnosis Wound #3: Same as Pre-Procedure General Notes: Scribed for Dr. Celine Ahr by MckeeScotton. Pre-procedure diagnosis of Wound #3 is a Venous Leg Ulcer located on the Right,Lateral Ankle . There was a Double Layer Compression Therapy Procedure by Dellie Catholic, RN. Post procedure Diagnosis Wound #3: Same as Pre-Procedure Plan Follow-up Appointments: Return Appointment in 1 week. - Dr. Celine Ahr RM 3 Anesthetic: Wound #2 Left,Posterior Lower Leg: (In clinic) Topical Lidocaine 5% applied to wound bed - in clinic, prior to debridement (In clinic) Topical Lidocaine 4% applied to wound bed Bathing/ Shower/ Hygiene: May shower with protection but do not get wound dressing(s) wet. Protect dressing(s) with water repellant cover (for example, large plastic bag) or a cast cover and may then take shower. Edema Control - Lymphedema / SCD / Other: Lymphedema Pumps. Use Lymphedema pumps on leg(s) 2-3 times a day for 45-60 minutes. If wearing any wraps or hose, do not remove them. Continue exercising as instructed. - Use x1 per day Avoid standing for long periods of time. Exercise regularly WOUND #2: - Lower Leg Wound Laterality: Left, Posterior Cleanser: Soap and Water 1 x Per Week/30 Days Discharge Instructions: May shower and wash wound with dial antibacterial soap and water prior to dressing change. Peri-Wound Care: Sween Lotion (Moisturizing lotion) 1 x Per Week/30 Days Discharge Instructions: Apply moisturizing lotion as directed Topical: Keystone 1 x Per Week/30 Days Discharge Instructions: Apply as directed to wound Prim  Dressing: Santyl Ointment 1 x Per Week/30 Days ary Discharge Instructions: Apply nickel thick amount to wound bed as instructed Secondary Dressing: T Non-adherent Dressing, 2x3 in 1 x Per Week/30 Days elfa Discharge Instructions: Apply over primary dressing as directed. Secondary Dressing: Zetuvit Plus 4x8 in 1 x Per Week/30 Days Discharge Instructions: Apply over primary dressing as directed. Secured With: 9M Medipore Public affairs consultant Surgical T 2x10 (in/yd) 1 x Per Week/30 Days ape Discharge Instructions: Secure with tape as directed. Com pression Wrap:  CoFlex Calamine Unna Boot 4 x 6 (in/yd) 1 x Per Week/30 Days Discharge Instructions: Apply Coflex Calamine [employer] as directed. WOUND #3: - Ankle Wound Laterality: Right, Lateral Cleanser: Soap and Water 1 x Per Week/30 Days Discharge Instructions: May shower and wash wound with dial antibacterial soap and water prior to dressing change. Peri-Wound Care: Sween Lotion (Moisturizing lotion) 1 x Per Week/30 Days Discharge Instructions: Apply moisturizing lotion as directed Topical: Keystone 1 x Per Week/30 Days Discharge Instructions: Apply as directed to wound Secondary Dressing: T Non-adherent Dressing, 2x3 in 1 x Per Week/30 Days elfa Discharge Instructions: Apply over primary dressing as directed. Secondary Dressing: Woven Gauze Sponge, Non-Sterile 4x4 in 1 x Per Week/30 Days Discharge Instructions: Apply over primary dressing as directed. Secured With: 101M Medipore Public affairs consultant Surgical T 2x10 (in/yd) 1 x Per Week/30 Days ape Discharge Instructions: Secure with tape as directed. Com pression Wrap: CoFlex Calamine Unna Boot 4 x 6 (in/yd) 1 x Per Week/30 Days Discharge Instructions: Apply Coflex Calamine [employer] as directed. 01/30/2023: The right lateral ankle wound is down to just a pinhole under a layer of eschar. The left posterior calf wound is quite a bit improved since the last time I saw it. There is still a layer of slough on the  surface but there has been more epithelialization. I used a curette to debride the eschar off of the right lateral ankle wound and slough and subcutaneous tissue off of the left posterior leg wound. We will continue Santyl and Keystone topical antibiotic compound (prescription drug) to the left leg wound and Keystone alone to the right leg wound. Continue bilateral calamine-based Coflex. Follow-up in 1 week. Electronic Signature(s) Signed: 01/30/2023 3:15:33 PM By: Fredirick Maudlin MD FACS Entered By: Fredirick Maudlin on 01/30/2023 15:15:33 [name] [id number] Page 10 of 12 -------------------------------------------------------------------------------- HxROS Details Patient Name: Date of Service: [name], [name] 01/30/2023 2:15 PM Medical Record Number: [id number] Patient Account Number: [id number] Date of Birth/Sex: Treating RN: 1946/01/27 (77 y.o. F) Primary Care Provider: [name] Other Clinician: Referring Provider: Treating Provider/Extender: [name] Session in Treatment: 26 Information Obtained From Patient Eyes Medical History: Positive for: Cataracts - L eye Ear/Nose/Mouth/Throat Medical History: Negative for: Chronic sinus problems/congestion; Middle ear problems Hematologic/Lymphatic Medical History: Negative for: Anemia; Human Immunodeficiency Virus; Lymphedema Respiratory Medical History: Positive for: Asthma Past Medical History Notes: OSA on CPAP Cardiovascular Medical History: Positive for: Hypertension Past Medical History Notes: Cronic venous insufficiency 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 Page 11 of 12 Pneumococcal Vaccine: Received Pneumococcal Vaccination: Yes Received Pneumococcal Vaccination On or After 60th Birthday: Yes Implantable Devices No devices added Hospitalization / Surgery History Type of Hospitalization/Surgery Lower extremity venography 2020 Intravascular ultrasound peripheral vascular intervention melanoma 2011 Family and Social History Cancer: Yes - Mother,Paternal Grandparents; Diabetes: Yes - Siblings; Heart Disease: Yes - Father; Hypertension: Yes - Father; Former smoker - quit in [employer]) Signed: 01/30/2023 4:43:02 PM By: Fredirick Maudlin MD FACS Entered By: Fredirick Maudlin on 01/30/2023 15:13:43 -------------------------------------------------------------------------------- SuperBill Details Patient  Name: Date of Service: RASHEKA, LOVELESS 01/30/2023 Medical Record Number: [id number] Patient Account Number: [id number] Date of Birth/Sex: Treating RN: [birth year] (77 y.o. F) Primary Care Provider: [name] Other Clinician: Referring Provider: Treating Provider/Extender: [name] Session in Treatment: 26 Diagnosis Coding ICD-10 Codes Code Description [phone] Non-pressure chronic ulcer of other part of left lower leg with fat layer exposed L97.818 Non-pressure chronic ulcer of other part of right lower leg with other specified severity I83.893 Varicose veins of bilateral lower extremities with other complications [id number] Venous insufficiency (chronic) (peripheral) G62.9 Polyneuropathy, unspecified R60.0 Localized edema L03.115 Cellulitis of right lower  limb Facility Procedures : CPT4 Code: [id number] Description: [id number] - DEB SUBQ TISSUE 20 SQ CM/< ICD-10 Diagnosis Description L97.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d Modifier: Quantity: 1 : CPT4 Code: [id number] Description: [id number] - DEBRIDE WOUND 1ST 20 SQ CM OR < ICD-10 Diagnosis Description L97.818 Non-pressure chronic ulcer of other part of right lower leg with other specified sev Modifier: erity Quantity: 1 Physician Procedures : CPT4 Code Description Modifier [id number] - WC PHYS LEVEL 4 - EST PT 25 ICD-10 Diagnosis Description L97.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d L97.818 Non-pressure chronic ulcer of other part of right  lower leg with other specified severity I87.2 Venous insufficiency (chronic) (peripheral) G62.9 Polyneuropathy, unspecified Quantity: 1 : [id number] - WC PHYS SUBQ TISS 20 SQ CM ICD-10 Diagnosis Description L97.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d SATANYA, ZIEMKE ([id number]) [id number] Quantity: 1 .pdf Page 12 of 12 : [id number] - WC PHYS DEBR WO ANESTH 20 SQ CM 1 ICD-10 Diagnosis Description L97.818 Non-pressure chronic ulcer of other part of right lower leg with other specified severity Quantity: Electronic Signature(s) Signed: 01/30/2023 3:15:57 PM By: Fredirick Maudlin MD FACS Entered By: Fredirick Maudlin on 01/30/2023 15:15:57

## 2023-02-07 ENCOUNTER — Ambulatory Visit: Payer: Medicare Other | Admitting: Podiatry

## 2023-02-07 ENCOUNTER — Encounter (HOSPITAL_BASED_OUTPATIENT_CLINIC_OR_DEPARTMENT_OTHER): Payer: Medicare Other | Admitting: General Surgery

## 2023-02-07 DIAGNOSIS — W5503XA Scratched by cat, initial encounter: Secondary | ICD-10-CM | POA: Diagnosis not present

## 2023-02-07 DIAGNOSIS — I10 Essential (primary) hypertension: Secondary | ICD-10-CM | POA: Insufficient documentation

## 2023-02-07 DIAGNOSIS — L97222 Non-pressure chronic ulcer of left calf with fat layer exposed: Secondary | ICD-10-CM | POA: Diagnosis not present

## 2023-02-07 DIAGNOSIS — L97312 Non-pressure chronic ulcer of right ankle with fat layer exposed: Secondary | ICD-10-CM | POA: Diagnosis not present

## 2023-02-07 DIAGNOSIS — G4733 Obstructive sleep apnea (adult) (pediatric): Secondary | ICD-10-CM | POA: Diagnosis not present

## 2023-02-07 DIAGNOSIS — L03115 Cellulitis of right lower limb: Secondary | ICD-10-CM | POA: Diagnosis not present

## 2023-02-07 DIAGNOSIS — Z86718 Personal history of other venous thrombosis and embolism: Secondary | ICD-10-CM | POA: Diagnosis not present

## 2023-02-07 DIAGNOSIS — L97818 Non-pressure chronic ulcer of other part of right lower leg with other specified severity: Secondary | ICD-10-CM | POA: Diagnosis not present

## 2023-02-07 DIAGNOSIS — L97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d: Secondary | ICD-10-CM | POA: Insufficient documentation

## 2023-02-07 DIAGNOSIS — G629 Polyneuropathy, unspecified: Secondary | ICD-10-CM | POA: Insufficient documentation

## 2023-02-07 DIAGNOSIS — I872 Venous insufficiency (chronic) (peripheral): Secondary | ICD-10-CM | POA: Diagnosis not present

## 2023-02-07 DIAGNOSIS — J45909 Unspecified asthma, uncomplicated: Secondary | ICD-10-CM | POA: Diagnosis not present

## 2023-02-08 NOTE — Progress Notes (Signed)
[name], [name] ([id number]) [id number] Page 1 of 9 Visit Report for 02/07/2023 Arrival Information Details Patient Name: Date of Service: [name], [name]. 02/07/2023 2:45 PM Medical Record Number: [id number] Patient Account Number: [id number] Date of Birth/Sex: Treating [name]: 09/23/1946 (77 y.o. [name]) [name] Primary Care [name]: [name], Amber Other Clinician: Referring [name]: Treating [name]/Extender: [name], [name], Amber Weeks in Treatment: 27 Visit Information History Since Last Visit Added or deleted any medications: No Patient Arrived: Ambulatory Any new allergies or adverse reactions: No Arrival Time: 15:31 Had a fall or experienced change in No Accompanied By: self activities of daily living that may affect Transfer Assistance: None risk of falls: Patient Identification Verified: Yes Signs or symptoms of abuse/neglect since last visito No Secondary Verification Process Completed: Yes Hospitalized since last visit: No Patient Requires Transmission-Based Precautions: No Implantable device outside of the clinic excluding No Patient Has Alerts: No cellular tissue based products placed in the center since last visit: Has Dressing in Place as Prescribed: Yes Has Compression in Place as Prescribed: Yes Pain Present Now: Yes Electronic Signature(s) Signed: 02/07/2023 5:39:06 PM By: [name], [name], [name] Entered By: [name], Tyshell on 02/07/2023 15:36:28 -------------------------------------------------------------------------------- Compression Therapy Details Patient Name: Date of Service: [name], [name]. 02/07/2023 2:45 PM Medical Record Number: [id number] Patient Account Number: [id number] Date of Birth/Sex: Treating [name]: 02/26/1946 (77 y.o. [name]) [name] Primary Care Gurfateh Mcclain: [name], Amber Other Clinician: Referring Reshanda Lewey: Treating Keilin Gamboa/Extender: [name], [name], Amber Weeks in Treatment:  27 Compression Therapy Performed for Wound Assessment: Wound #2 Left,Posterior Lower Leg Performed By: Clinician [name], Lutie, [name] Compression Type: Double Layer Post Procedure Diagnosis Same as Pre-procedure Notes calmine coflex Electronic Signature(s) Signed: 02/07/2023 5:39:06 PM By: [name], Capria [name], [name] Entered By: [name], Sherida on 02/07/2023 16:01:13 -------------------------------------------------------------------------------- Compression Therapy Details Patient Name: Date of Service: [name], [name]. 02/07/2023 2:45 PM Medical Record Number: [id number] Patient Account Number: [id number] Date of Birth/Sex: Treating [name]: 12/24/1945 (77 y.o. [name]) [name], [name] Primary Care Orville Mena: [name], Amber Other Clinician: Referring Kamora Vossler: Treating Ralpheal Zappone/Extender: [name], [name], Amber Weeks in Treatment: 27 Compression Therapy Performed for Wound Assessment: Wound #3 Right,Lateral Ankle Performed By: [name] [name], Kamillah, [name] Compression Type: Double [name] [name] ([id number]) [id number] Page 2 of 9 Post Procedure Diagnosis Same as Pre-procedure Notes calmine coflex Electronic Signature(s) Signed: 02/07/2023 5:39:06 PM By: [name], Awanda [name], [name] Entered By: [name], Esparanza on 02/07/2023 16:01:13 -------------------------------------------------------------------------------- Encounter Discharge Information Details Patient Name: Date of Service: [name], [name]. 02/07/2023 2:45 PM Medical Record Number: [id number] Patient Account Number: [id number] Date of Birth/Sex: Treating [name]: 02/16/1946 (77 y.o. [name]) [name], [name] Primary Care Lemoyne Nestor: [name], Amber Other Clinician: Referring Kevon Tench: Treating Kymoni Monday/Extender: [name], [name], Amber Weeks in Treatment: 27 Encounter Discharge Information Items Post Procedure Vitals Discharge Condition: Stable Temperature (F): 97.6 Ambulatory Status:  Ambulatory Pulse (bpm): 75 Discharge Destination: Home Respiratory Rate (breaths/min): 18 Transportation: Private Auto Blood Pressure (mmHg): 172/94 Accompanied By: self Schedule Follow-up Appointment: Yes Clinical Summary of Care: Patient Declined Electronic Signature(s) Signed: 02/07/2023 5:39:06 PM By: [name], Sherian [name], [name] Entered By: [name], Ola on 02/07/2023 16:23:15 -------------------------------------------------------------------------------- Lower Extremity Assessment Details Patient Name: Date of Service: [name], [name]. 02/07/2023 2:45 PM Medical Record Number: [id number] Patient Account Number: [id number] Date of Birth/Sex: Treating [name]: 03/23/1946 (77 y.o. [name]) [name], [name] Primary Care Woodie Trusty: [name], Amber Other Clinician: Referring Ojani Berenson: Treating Breezy Hertenstein/Extender: [name], [name], Amber Weeks in Treatment: 27 Edema Assessment Assessed: [Left: No] [Right: No] Edema: [Left: Yes] [Right: Yes] Calf Left: Right:  Point of Measurement: From Medial Instep 36.5 cm 38.5 cm Ankle Left: Right: Point of Measurement: From Medial Instep 23 cm 22.5 cm Vascular Assessment Pulses: Dorsalis Pedis Palpable: [Left:Yes] [Right:Yes] Electronic Signature(s) Signed: 02/07/2023 5:39:06 PM By: Amber Deed [name], [name] Entered By: Amber Deed on 02/07/2023 15:41:41 Amber Buffy ([id number]) [id number] Page 3 of 9 -------------------------------------------------------------------------------- Multi Wound Chart Details Patient Name: Date of Service: [name], [name] 02/07/2023 2:45 PM Medical Record Number: [id number] Patient Account Number: [id number] Date of Birth/Sex: Treating [name]: 02-08-1946 (77 y.o. F) Primary Care Feiga Nadel: Amber Boyden Other Clinician: Referring Amiliah Campisi: Treating Steve Gregg/Extender: Amber Loveless in Treatment: 27 Vital Signs Height(in): 63 Pulse(bpm): 75 Weight(lbs):  200 Blood Pressure(mmHg): 172/94 Body Mass Index(BMI): 35.4 Temperature(F): 97.6 Respiratory Rate(breaths/min): 18 [2:Photos:] [N/A:N/A] Left, Posterior Lower Leg Right, Lateral Ankle N/A Wound Location: Blister Contusion/Bruise N/A Wounding Event: Venous Leg Ulcer Venous Leg Ulcer N/A Primary Etiology: Cataracts, Asthma, Hypertension, Cataracts, Asthma, Hypertension, N/A Comorbid History: Neuropathy, Received Chemotherapy, Neuropathy, Received Chemotherapy, Received Radiation Received Radiation 07/06/2022 10/19/2022 N/A Date Acquired: 27 15 N/A Weeks of Treatment: Open Open N/A Wound Status: No No N/A Wound Recurrence: 5x2.7x0.1 0.1x0.1x0.1 N/A Measurements L x W x D (cm) 10.603 0.008 N/A A (cm) : rea 1.06 0.001 N/A Volume (cm) : -54.30% 99.70% N/A % Reduction in A rea: -54.30% 99.60% N/A % Reduction in Volume: Full Thickness Without Exposed Full Thickness Without Exposed N/A Classification: Support Structures Support Structures Medium None Present N/A Exudate A mount: Serosanguineous N/A N/A Exudate Type: red, brown N/A N/A Exudate Color: Distinct, outline attached Flat and Intact N/A Wound Margin: Medium (34-66%) None Present (0%) N/A Granulation A mount: Pink N/A N/A Granulation Quality: Medium (34-66%) Large (67-100%) N/A Necrotic A mount: Adherent Slough Eschar N/A Necrotic Tissue: Fat Layer (Subcutaneous Tissue): Yes Fat Layer (Subcutaneous Tissue): Yes N/A Exposed Structures: Fascia: No Fascia: No Tendon: No Tendon: No Muscle: No Muscle: No Joint: No Joint: No Bone: No Bone: No Medium (34-66%) Large (67-100%) N/A Epithelialization: Debridement - Selective/Open Wound Debridement - Selective/Open Wound N/A Debridement: Pre-procedure Verification/Time Out 15:55 15:55 N/A Taken: Lidocaine 4% Topical Solution Lidocaine 4% Topical Solution N/A Pain Control: Ambulance person N/A Tissue Debrided: Non-Viable Tissue Non-Viable Tissue  N/A Level: 13.5 0.09 N/A Debridement A (sq cm): rea Curette Curette N/A Instrument: Minimum None N/A Bleeding: Pressure N/A N/A Hemostasis A chieved: 7 7 N/A Procedural Pain: 3 3 N/A Post Procedural Pain: Procedure was tolerated well Procedure was tolerated well N/A Debridement Treatment Response: 5x2.7x0.1 0.1x0.1x0.1 N/A Post Debridement Measurements L x W x D (cm) 1.06 0.001 N/A Post Debridement Volume: (cm) Scarring: Yes Excoriation: Yes N/A Periwound Skin Texture: No Abnormalities Noted Maceration: Yes N/A [address] Heather CircleSUN, KIHN ([id number]) [id number] Page 4 of 9 Rubor: No Erythema: No N/A Periwound Skin Color: No Abnormality No Abnormality N/A Temperature: Yes Yes N/A Tenderness on Palpation: Debridement Debridement N/A Procedures Performed: Treatment Notes Electronic Signature(s) Signed: 02/07/2023 3:58:45 PM By: Duanne Guess MD FACS Entered By: Duanne Guess on 02/07/2023 15:58:45 -------------------------------------------------------------------------------- Multi-Disciplinary Care Plan Details Patient Name: Date of Service: [name], [name] 02/07/2023 2:45 PM Medical Record Number: [id number] Patient Account Number: [id number] Date of Birth/Sex: Treating [name]: 18-Jun-1946 (78 y.o. Amber Standard Primary Care Glynis Hunsucker: Amber Boyden Other Clinician: Referring Tiyah Zelenak: Treating Allissa Albright/Extender: Amber Loveless in Treatment: 27 Multidisciplinary Care Plan reviewed with physician Active Inactive Venous Leg Ulcer Nursing Diagnoses: Knowledge deficit related to disease process and management Potential for venous Insuffiency (use before diagnosis  confirmed) Goals: Patient will maintain optimal edema control Date Initiated: 02/07/2023 Target Resolution Date: 03/07/2023 Goal Status: Active Interventions: Assess peripheral edema status every visit. Compression as  ordered Treatment Activities: Therapeutic compression applied : 02/07/2023 Notes: Wound/Skin Impairment Nursing Diagnoses: Impaired tissue integrity Goals: Ulcer/skin breakdown will have a volume reduction of 30% by week 4 Date Initiated: 08/03/2022 Date Inactivated: 09/10/2022 Target Resolution Date: 08/31/2022 Unmet Reason: uncontrolled edema, Goal Status: Unmet acute infection Ulcer/skin breakdown will have a volume reduction of 50% by week 8 Date Initiated: 09/10/2022 Target Resolution Date: 04/05/2023 Goal Status: Active Interventions: Assess ulceration(s) every visit Treatment Activities: Skin care regimen initiated : 08/03/2022 Notes: Keystone ordered. 10/30 RX Levo started. 09/10/22 Electronic Signature(s) Signed: 02/07/2023 5:39:06 PM By: Amber Deed [name], [name] Entered By: Amber Deed on 02/07/2023 15:54:42 Amber Buffy ([id number]) [id number] Page 5 of 9 -------------------------------------------------------------------------------- Pain Assessment Details Patient Name: Date of Service: [name], [name] 02/07/2023 2:45 PM Medical Record Number: [id number] Patient Account Number: [id number] Date of Birth/Sex: Treating [name]: [birth year] (77 y.o. Amber Standard Primary Care Mekaila Tarnow: Amber Boyden Other Clinician: Referring Denyse Fillion: Treating Dirk Vanaman/Extender: Amber Loveless in Treatment: 27 Active Problems Location of Pain Severity and Description of Pain Patient Has Paino Yes Site Locations With Dressing Change: No Rate the pain. Current Pain Level: 3 Least Pain Level: 0 Character of Pain Describe the Pain: Other: sore Pain Management and Medication Current Pain Management: Medication: Yes Is the Current Pain Management Adequate: Adequate How does your wound impact your activities of daily livingo Sleep: No Bathing: No Appetite: No Relationship With Others: No Bladder Continence: No Emotions:  No Bowel Continence: No Work: No Toileting: No Drive: No Dressing: No Hobbies: No Electronic Signature(s) Signed: 02/07/2023 5:39:06 PM By: Amber Deed [name], [name] Entered By: Amber Deed on 02/07/2023 15:37:48 -------------------------------------------------------------------------------- Patient/Caregiver Education Details Patient Name: Date of Service: Amber Buffy 4/4/2024andnbsp2:45 PM Medical Record Number: [id number] Patient Account Number: [id number] Date of Birth/Gender: Treating [name]: [birth year] (77 y.o. Amber Standard Primary Care Physician: Amber Boyden Other Clinician: Referring Physician: Treating Physician/Extender: Amber Loveless in Treatment: 27 Education Assessment Education Provided To: Patient Education Topics Provided [name], [name] ([id number]) [id number] Page 6 of 9 Venous: Methods: Explain/Verbal Responses: Reinforcements needed, State content correctly Wound/Skin Impairment: Methods: Explain/Verbal Responses: Reinforcements needed, State content correctly Electronic Signature(s) Signed: 02/07/2023 5:39:06 PM By: Amber Deed [name], [name] Entered By: Amber Deed on 02/07/2023 15:55:03 -------------------------------------------------------------------------------- Wound Assessment Details Patient Name: Date of Service: [name], [name] 02/07/2023 2:45 PM Medical Record Number: [id number] Patient Account Number: [id number] Date of Birth/Sex: Treating [name]: 17-Jul-1946 (77 y.o. Amber Coast, Lynn Primary Care Lysette Lindenbaum: Amber Boyden Other Clinician: Referring Calina Patrie: Treating Jessilynn Taft/Extender: Amber Caper Weeks in Treatment: 27 Wound Status Wound Number: 2 Primary Venous Leg Ulcer Etiology: Wound Location: Left, Posterior Lower Leg Wound Open Wounding Event: Blister Status: Date Acquired: 07/06/2022 Comorbid Cataracts, Asthma, Hypertension, Neuropathy,  Received Weeks Of Treatment: 27 History: Chemotherapy, Received Radiation Clustered Wound: No Photos Wound Measurements Length: (cm) 5 Width: (cm) 2.7 Depth: (cm) 0.1 Area: (cm) 10.603 Volume: (cm) 1.06 % Reduction in Area: -54.3% % Reduction in Volume: -54.3% Epithelialization: Medium (34-66%) Tunneling: No Undermining: No Wound Description Classification: Full Thickness Without Exposed Suppor Wound Margin: Distinct, outline attached Exudate Amount: Medium Exudate Type: Serosanguineous Exudate Color: red, brown t Structures Foul Odor After Cleansing: No Slough/Fibrino Yes Wound Bed Granulation Amount: Medium (34-66%) Exposed Structure Granulation Quality: Pink Fascia Exposed: No Necrotic Amount: Medium (34-66%) Fat  Layer (Subcutaneous Tissue) Exposed: Yes Necrotic Quality: Adherent Slough Tendon Exposed: No Muscle Exposed: No Joint Exposed: No Bone Exposed: No [address]Per[address] Skin Texture Texture Color [name], Ann-Marie [name] ([id number]) [id number] Page 7 of 9 No Abnormalities Noted: Yes No Abnormalities Noted: Yes Moisture Temperature / Pain No Abnormalities Noted: Yes Temperature: No Abnormality Tenderness on Palpation: Yes Treatment Notes Wound #2 (Lower Leg) Wound Laterality: Left, Posterior Cleanser Soap and Water Discharge Instruction: May shower and wash wound with dial antibacterial soap and water prior to dressing change. Peri-Wound Care Sween Lotion (Moisturizing lotion) Discharge Instruction: Apply moisturizing lotion as directed Topical Keystone Discharge Instruction: Apply as directed to wound Primary Dressing Santyl Ointment Discharge Instruction: Apply nickel thick amount to wound bed as instructed Secondary Dressing T Non-adherent Dressing, 2x3 in elfa Discharge Instruction: Apply over primary dressing as directed. Zetuvit Plus 4x8 in Discharge Instruction: Apply over primary dressing as directed. Secured With [employer]  Medipore Soft Cloth Surgical T 2x10 (in/yd) ape Discharge Instruction: Secure with tape as directed. Compression Wrap CoFlex Calamine Unna Boot 4 x 6 (in/yd) Discharge Instruction: Apply Coflex Calamine [employer] Boot as directed. Compression Stockings Add-Ons Electronic Signature(s) Signed: 02/07/2023 5:39:06 PM By: [name], Christiana [name], [name] Entered By: [name], Kyria on 02/07/2023 15:49:58 -------------------------------------------------------------------------------- Wound Assessment Details Patient Name: Date of Service: [name], Chandel [name]. 02/07/2023 2:45 PM Medical Record Number: [id number] Patient Account Number: [id number] Date of Birth/Sex: Treating [name]: 03/15/1946 (77 y.o. [name]) [name], Jezreel Primary Care Keyonte Cookston: [name], Amber Other Clinician: Referring Donda Friedli: Treating Bandy Honaker/Extender: [name], [name], Amber Weeks in Treatment: 27 Wound Status Wound Number: 3 Primary Venous Leg Ulcer Etiology: Wound Location: Right, Lateral Ankle Wound Open Wounding Event: Contusion/Bruise Status: Date Acquired: 10/19/2022 Comorbid Cataracts, Asthma, Hypertension, Neuropathy, Received Weeks Of Treatment: 15 History: Chemotherapy, Received Radiation Clustered Wound: No Photos [name], Mariyah [name] ([id number]) [id number] Page 8 of 9 Wound Measurements Length: (cm) 0.1 Width: (cm) 0.1 Depth: (cm) 0.1 Area: (cm) 0.008 Volume: (cm) 0.001 % Reduction in Area: 99.7% % Reduction in Volume: 99.6% Epithelialization: Large (67-100%) Tunneling: No Undermining: No Wound Description Classification: Full Thickness Without Exposed Support Structures Wound Margin: Flat and Intact Exudate Amount: None Present Foul Odor After Cleansing: No Slough/Fibrino Yes Wound Bed Granulation Amount: None Present (0%) Exposed Structure Necrotic Amount: Large (67-100%) Fascia Exposed: No Necrotic Quality: Eschar Fat Layer (Subcutaneous Tissue) Exposed: Yes Tendon  Exposed: No Muscle Exposed: No Joint Exposed: No Bone Exposed: No Periwound Skin Texture Texture Color No Abnormalities Noted: Yes No Abnormalities Noted: Yes Moisture Temperature / Pain No Abnormalities Noted: Yes Temperature: No Abnormality Tenderness on Palpation: Yes Treatment Notes Wound #3 (Ankle) Wound Laterality: Right, Lateral Cleanser Soap and Water Discharge Instruction: May shower and wash wound with dial antibacterial soap and water prior to dressing change. Peri-Wound Care Sween Lotion (Moisturizing lotion) Discharge Instruction: Apply moisturizing lotion as directed Topical Primary Dressing Santyl Ointment Discharge Instruction: Apply nickel thick amount to wound bed as instructed Secondary Dressing T Non-adherent Dressing, 2x3 in elfa Discharge Instruction: Apply over primary dressing as directed. Woven Gauze Sponge, Non-Sterile 4x4 in Discharge Instruction: Apply over primary dressing as directed. Secured With [employer] Medipore Soft Cloth Surgical T 2x10 (in/yd) ape Discharge Instruction: Secure with tape as directed. Compression Wrap CoFlex Calamine Unna Boot 4 x 6 (in/yd) Discharge Instruction: Apply Coflex Calamine [employer] Boot as directed. [name], Amanie [name] ([id number]) [id number] Page 9 of 9 Compression Stockings Add-Ons Electronic Signature(s) Signed: 02/07/2023 5:39:06 PM By: [name], Saloni [name], [name] Entered By: [name], Larri on 02/07/2023 15:50:30 -------------------------------------------------------------------------------- Vitals  Details Patient Name: Date of Service: OPAL, ALVILLAR 02/07/2023 2:45 PM Medical Record Number: [id number] Patient Account Number: [id number] Date of Birth/Sex: Treating [name]: May 28, 1946 (77 y.o. Amber Standard Primary Care Fuad Forget: Amber Boyden Other Clinician: Referring [name] [name]: Treating Sheyann Sulton/Extender: Amber Loveless in Treatment: 27 Vital Signs Time  Taken: 15:35 Temperature (F): 97.6 Height (in): 63 Pulse (bpm): 75 Weight (lbs): 200 Respiratory Rate (breaths/min): 18 Body Mass Index (BMI): 35.4 Blood Pressure (mmHg): 172/94 Reference Range: 80 - 120 mg / dl Electronic Signature(s) Signed: 02/07/2023 5:39:06 PM By: Amber Deed [name], [name] Entered By: Amber Deed on 02/07/2023 15:36:50

## 2023-02-08 NOTE — Progress Notes (Addendum)
[name], [name] ([id number]) [id number] Page 1 of 12 Visit Report for 02/07/2023 Chief Complaint Document Details Patient Name: Date of Service: [name] 02/07/2023 2:45 PM Medical Record Number: [id number] Patient Account Number: [id number] Date of Birth/Sex: Treating RN: 06-26-1946 (77 y.o. F) Primary Care Provider: Eustaquio Boyden Other Clinician: Referring Provider: Treating Provider/Extender: Priscille Loveless in Treatment: 27 Information Obtained from: Patient Chief Complaint Patient presents for treatment of an open ulcer due to venous insufficiency Electronic Signature(s) Signed: 02/07/2023 3:58:52 PM By: Duanne Guess MD FACS Entered By: Duanne Guess on 02/07/2023 15:58:51 -------------------------------------------------------------------------------- Debridement Details Patient Name: Date of Service: [name], [name] 02/07/2023 2:45 PM Medical Record Number: [id number] Patient Account Number: [id number] Date of Birth/Sex: Treating RN: Jul 29, 1946 (77 y.o. Billy Coast, Baileigh Primary Care Provider: Eustaquio Boyden Other Clinician: Referring Provider: Treating Provider/Extender: Priscille Loveless in Treatment: 27 Debridement Performed for Assessment: Wound #3 Right,Lateral Ankle Performed By: Physician Duanne Guess, MD Debridement Type: Debridement Severity of Tissue Pre Debridement: Fat layer exposed Level of Consciousness (Pre-procedure): Awake and Alert Pre-procedure Verification/Time Out Yes - 15:55 Taken: Start Time: 15:55 Pain Control: Lidocaine 4% T opical Solution T Area Debrided (L x W): otal 0.3 (cm) x 0.3 (cm) = 0.09 (cm) Tissue and other material debrided: Non-Viable, Eschar Level: Non-Viable Tissue Debridement Description: Selective/Open Wound Instrument: Curette Bleeding: None Procedural Pain: 7 Post Procedural Pain: 3 Response to Treatment: Procedure was tolerated  well Level of Consciousness (Post- Awake and Alert procedure): Post Debridement Measurements of Total Wound Length: (cm) 0.1 Width: (cm) 0.1 Depth: (cm) 0.1 Volume: (cm) 0.001 Character of Wound/Ulcer Post Debridement: Requires Further Debridement Severity of Tissue Post Debridement: Fat layer exposed Post Procedure Diagnosis Same as Pre-procedure Notes scribed for Dr. Lady Gary by Zenaida Deed, RN Electronic Signature(s) Signed: 02/07/2023 4:02:38 PM By: Duanne Guess MD FACS Signed: 02/07/2023 5:39:06 PM By: Zenaida Deed RN, [name] [place], Newton [name] ([id number]) [id number] Page 2 of 12 Entered By: Zenaida Deed on 02/07/2023 15:56:46 -------------------------------------------------------------------------------- Debridement Details Patient Name: Date of Service: [name], [name] 02/07/2023 2:45 PM Medical Record Number: [id number] Patient Account Number: [id number] Date of Birth/Sex: Treating RN: 05-27-1946 (77 y.o. Billy Coast, Jurnei Primary Care Provider: Eustaquio Boyden Other Clinician: Referring Provider: Treating Provider/Extender: Priscille Loveless in Treatment: 27 Debridement Performed for Assessment: Wound #2 Left,Posterior Lower Leg Performed By: Physician Duanne Guess, MD Debridement Type: Debridement Severity of Tissue Pre Debridement: Fat layer exposed Level of Consciousness (Pre-procedure): Awake and Alert Pre-procedure Verification/Time Out Yes - 15:55 Taken: Start Time: 15:55 Pain Control: Lidocaine 4% T opical Solution T Area Debrided (L x W): otal 5 (cm) x 2.7 (cm) = 13.5 (cm) Tissue and other material debrided: Non-Viable, Slough, Slough Level: Non-Viable Tissue Debridement Description: Selective/Open Wound Instrument: Curette Bleeding: Minimum Hemostasis Achieved: Pressure Procedural Pain: 7 Post Procedural Pain: 3 Response to Treatment: Procedure was tolerated well Level of Consciousness  (Post- Awake and Alert procedure): Post Debridement Measurements of Total Wound Length: (cm) 5 Width: (cm) 2.7 Depth: (cm) 0.1 Volume: (cm) 1.06 Character of Wound/Ulcer Post Debridement: Stable Severity of Tissue Post Debridement: Fat layer exposed Post Procedure Diagnosis Same as Pre-procedure Notes scribed for Dr. Lady Gary by Zenaida Deed, RN Electronic Signature(s) Signed: 02/07/2023 4:02:38 PM By: Duanne Guess MD FACS Signed: 02/07/2023 5:39:06 PM By: Zenaida Deed RN, [name] Entered By: Zenaida Deed on 02/07/2023 15:57:36 -------------------------------------------------------------------------------- HPI Details Patient Name: Date of Service: [name] 02/07/2023 2:45 PM Medical Record Number: [id number] Patient Account Number: [id number]  Date of Birth/Sex: Treating RN: 03-Sep-1946 (77 y.o. F) Primary Care Provider: Eustaquio Boyden Other Clinician: Referring Provider: Treating Provider/Extender: Priscille Loveless in Treatment: 27 History of Present Illness HPI Description: ADMISSION This is a 77 year old woman with a history of chronic venous insufficiency status post saphenous vein ablations in 2010 and 2016. 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 She presents today with an open venous ulcer on her left lower leg. It has been present for a little over 2 weeks. She saw her primary care provider who apparently swabbed the wound and grew out Pseudomonas. She just completed a course of ciprofloxacin for this. ABI was 0.91. She reports that she has had previous issues with ulcers in this same location, stemming back to a punch biopsy taken by a dermatologist many years ago. She has had several skin substitutes applied to the area that have ultimately resulted in healing on prior occasions. [name], [name] ([id number]) [id number] Page 3 of 12 She has 2 small ulcers on her left medial lower  leg. There is slough accumulation in both of them. The more anterior of the 2 is quite small and has some soft slough on the surface, underneath which there is good granulation tissue. The more medial wound also has slough accumulation, but the underlying surface is fairly fibrotic and gritty. This is consistent with her provided history of multiple ulcers in the same location. 08/03/2022: The anterior wound is smaller today with just a little bit of slough accumulation. The more medial wound continues to be very sensitive and fibrotic with slough buildup. 08/13/2022: The anterior wound is closed. The more medial wound remains sensitive with a fairly fibrotic surface. There is more granulation tissue filling in, however, and there is less slough than on prior occasions. 08/20/2022: The anterior wound remains closed. The more medial wound has filled with granulation tissue but still has a fair amount of slough on the surface and remains fairly tender. Unfortunately, she has developed 2 small ulcers just proximal to this. The fat layer is exposed in each. She says that over the weekend, she felt a burning sensation in that location. 08/27/2022: The 2 small wounds proximal to the main wound have merged into a single site. The wounds look a little bit dry, but they are quite clean without significant slough accumulation. They remain quite tender. 09/03/2022: All of the wounds have now merged into 1 large wound. There is a strong odor coming from the wound and the surface is not particularly viable. It is extremely painful for her today. 09/10/2022: The wound is less black and purple this week but still does not look particularly viable. The surface is desiccated. The culture that I took last week returned with a polymicrobial population including Pseudomonas. I prescribed both Augmentin and levofloxacin. She has not yet picked up levofloxacin, as her pharmacy only notified her yesterday that it was ready.  We have ordered a Keystone topical compounded antibiotic, but it has not yet come. 09/17/2022: The wound surface is markedly improved today. There is still an area of grayish-looking muscle but the rest appears significantly more viable. There is still a layer of slough on the surface. She still has a couple days left of oral antibiotic therapy. She has her Jodie Echevaria compounded topical antibiotic with her today. 09/24/2022: She has completed her oral antibiotic therapy. The wound surface is much cleaner today and more viable without any necrotic tissue. It is a bit desiccated, however. 10/01/2022:  The moisture balance of the wound has improved. There is a layer of yellow slough on the surface, but beneath this, the wound is more pink. 10/08/2022: The wound is smaller and cleaner today with a layer of slough present. She is having less pain. 12/18; this patient has a new wound on the right lateral ankle which apparently started after a cat scratched her. Secondarily she managed to drop a cake pan on the same area. This is very painful. The original wound was on the left posterior calf we have been using [employer] under an [employer]. The Unna boot fell down and apparently she was in for a nurse visit perhaps last week and that 1 fell down as well This patient has central venous mediated hypertension. For member correctly she has a stent placed in the left common iliac artery by Dr. Randie Heinz some years ago [facility] Thurner syndrome] she also has had venous ablations and I believe a history of a DVT The patient has compression pumps at home but she does not use them 10/30/2022: The patient says that her wounds burned all week with the [facility] classic in place. She has a fair amount of slough and nonviable subcutaneous tissue present on the right ankle. The left posterior calf wound is cleaner than I have seen it to date. Both remain fairly tender. 11/14/2022: Both wounds have substantial slough and  eschar accumulation. They remain extremely tender. 11/21/2022: The wounds are cleaner this week, but still have slough and eschar accumulation. She continues to describe burning pain in both wounds, but upon further questioning, she actually has burning in both of her feet that radiates up her legs and includes to the wounds. 11/28/2022: Both wounds have slough and eschar accumulation, as well as adherent silver alginate that was difficult to remove. She continues to have pain out of proportion to the extent of her wounds. Her PCP did initiate Lyrica which she started taking last night. The dose is quite low, only 25 mg, but apparently there is a plan for upward titration, assuming she tolerates the medication. 12/05/2022: The wounds are both a little bit smaller today, but have significant slough accumulation, as usual. They remain exquisitely tender. She is now taking Lyrica twice a day. 12/19/2022: Both of her wounds look significantly better this week. There is slough buildup on both, but they are smaller. She seems to be getting a good response from Lyrica as she is having much less pain. 12/26/2022: The wound on her right lateral ankle is nearly closed. The wound on her left posterior calf is smaller but still has a lot of slough accumulation. They remain tender. 01/02/2023: The right lateral ankle wound is down to just a couple of millimeters. It is much less tender than on prior occasions. There is minimal slough buildup. The wound on her posterior calf continues to contract, as well. It has thick slough buildup and remains fairly tender. 01/09/2023: The right lateral ankle wound is nearly closed. He remains tender. There is just a little eschar on the surface. The wound on her posterior calf has improved quite a bit. There is still slough on the surface, but the more proximal area has epithelialized substantially and there is no longer any gray discoloration to her tissue. 3/13; patient presents  for follow-up. Her right lateral ankle wound is almost healed. She has a wound to her left posterior calf with slough and granulation tissue. We have been using Santyl under Unna boots bilaterally to the  lower extremities. She has no issues or complaints today. 01/30/2023: The right lateral ankle wound is down to just a pinhole under a layer of eschar. The left posterior calf wound is quite a bit improved since the last time I saw it. There is still a layer of slough on the surface but there has been more epithelialization. 02/07/2023: The right lateral ankle wound still has some eschar on the surface. The left posterior calf wound is about the same size, but with more areas of epithelialization. Electronic Signature(s) Signed: 02/07/2023 3:59:21 PM By: Duanne Guess MD FACS Entered By: Duanne Guess on 02/07/2023 15:59:21 Herzberg, [name] ([id number]) [id number] Page 4 of 12 -------------------------------------------------------------------------------- Physical Exam Details Patient Name: Date of Service: [name], [name] 02/07/2023 2:45 PM Medical Record Number: [id number] Patient Account Number: [id number] Date of Birth/Sex: Treating RN: August 19, 1946 (77 y.o. F) Primary Care Provider: Eustaquio Boyden Other Clinician: Referring Provider: Treating Provider/Extender: Priscille Loveless in Treatment: 27 Constitutional Hypertensive, asymptomatic. . . . no acute distress. Respiratory Normal work of breathing on room air. Notes 02/07/2023: The right lateral ankle wound still has some eschar on the surface. The left posterior calf wound is about the same size, but with more areas of epithelialization. Electronic Signature(s) Signed: 02/07/2023 4:00:05 PM By: Duanne Guess MD FACS Entered By: Duanne Guess on 02/07/2023 16:00:05 -------------------------------------------------------------------------------- Physician Orders Details Patient  Name: Date of Service: [name], [name] 02/07/2023 2:45 PM Medical Record Number: [id number] Patient Account Number: [id number] Date of Birth/Sex: Treating RN: 01/19/1946 (77 y.o. 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 - Dr. Lady Gary RM 3 Return A Thursday 4/11 @ 2:45 pm Anesthetic Wound #2 Left,Posterior Lower Leg (In clinic) Topical Lidocaine 4% applied to wound bed Wound #3 Right,Lateral Ankle (In clinic) Topical Lidocaine 4% applied to wound bed Bathing/ Shower/ Hygiene May shower with protection but do not get wound dressing(s) wet. Protect dressing(s) with water repellant cover (for example, large plastic bag) or a cast cover and may then take shower. Edema Control - Lymphedema / SCD / Other Left Lower Extremity Lymphedema Pumps. Use Lymphedema pumps on leg(s) 2-3 times a day for 45-60 minutes. If wearing any wraps or hose, do not remove them. Continue exercising as instructed. - Use x1 per day Avoid standing for long periods of time. Exercise regularly Wound Treatment Wound #2 - Lower Leg Wound Laterality: Left, Posterior Cleanser: Soap and Water 1 x Per Week/30 Days ACIRE, TANG ([id number]) [phone].pdf Page 5 of 12 Discharge Instructions: May shower and wash wound with dial  antibacterial soap and water prior to dressing change. Peri-Wound Care: Sween Lotion (Moisturizing lotion) 1 x Per Week/30 Days Discharge Instructions: Apply moisturizing lotion as directed Topical: Keystone 1 x Per Week/30 Days Discharge Instructions: Apply as directed to wound Prim Dressing: Santyl Ointment 1 x Per Week/30 Days ary Discharge Instructions: Apply nickel thick amount to wound bed as instructed Secondary Dressing: T Non-adherent Dressing, 2x3 in 1 x Per Week/30 Days elfa Discharge  Instructions: Apply over primary dressing as directed. Secondary Dressing: Zetuvit Plus 4x8 in 1 x Per Week/30 Days Discharge Instructions: Apply over primary dressing as directed. Secured With: 60M Medipore Scientist, research (life sciences) Surgical T 2x10 (in/yd) 1 x Per Week/30 Days ape Discharge Instructions: Secure with tape as directed. Compression Wrap: CoFlex Calamine Unna Boot 4 x 6 (in/yd) 1 x Per Week/30 Days Discharge Instructions: Apply Coflex [employer] as directed. Wound #3 - Ankle Wound Laterality: Right, Lateral Cleanser: Soap and Water 1 x Per Week/30 Days Discharge Instructions: May shower and wash wound with dial antibacterial soap and water prior to dressing change. Peri-Wound Care: Sween Lotion (Moisturizing lotion) 1 x Per Week/30 Days Discharge Instructions: Apply moisturizing lotion as directed Prim Dressing: Santyl Ointment 1 x Per Week/30 Days ary Discharge Instructions: Apply nickel thick amount to wound bed as instructed Secondary Dressing: T Non-adherent Dressing, 2x3 in 1 x Per Week/30 Days elfa Discharge Instructions: Apply over primary dressing as directed. Secondary Dressing: Woven Gauze Sponge, Non-Sterile 4x4 in 1 x Per Week/30 Days Discharge Instructions: Apply over primary dressing as directed. Secured With: 60M Medipore Scientist, research (life sciences) Surgical T 2x10 (in/yd) 1 x Per Week/30 Days ape Discharge Instructions: Secure with tape as directed. Compression Wrap: CoFlex Calamine Unna  Boot 4 x 6 (in/yd) 1 x Per Week/30 Days Discharge Instructions: Apply Coflex Calamine [employer] as directed. Electronic Signature(s) Signed: 02/07/2023 4:19:14 PM By: Duanne Guess MD FACS Signed: 02/07/2023 5:39:06 PM By: Zenaida Deed RN, [name] Entered By: Zenaida Deed on 02/07/2023 16:02:54 -------------------------------------------------------------------------------- Problem List Details Patient Name: Date of Service: [name], [name] 02/07/2023 2:45 PM Medical Record Number: [id number] Patient Account Number: [id number] Date of Birth/Sex: Treating RN: February 25, 1946 (77 y.o. Tommye Standard Primary Care Provider: Eustaquio Boyden Other Clinician: Referring Provider: Treating Provider/Extender: Priscille Loveless in Treatment: 27 Active Problems ICD-10 Encounter Code Description Active Date MDM Diagnosis [phone] Non-pressure chronic ulcer of other part of left lower leg with fat layer exposed9/22/2023 No Yes L97.818 Non-pressure chronic ulcer of other part of right lower leg with other specified 10/22/2022 No Yes severity KIASIA, [name] ([id number]) [id number].pdf Page 6 of 12 [phone] Varicose veins of bilateral lower extremities with other complications 07/27/2022 No Yes I87.2 Venous insufficiency (chronic) (peripheral) 07/27/2022 No Yes G62.9 Polyneuropathy, unspecified 07/27/2022 No Yes R60.0 Localized edema 07/27/2022 No Yes L03.115 Cellulitis of right lower limb 10/22/2022 No Yes Inactive Problems Resolved Problems Electronic Signature(s) Signed: 02/07/2023 3:58:34 PM By: Duanne Guess MD FACS Entered By: Duanne Guess on 02/07/2023 15:58:34 -------------------------------------------------------------------------------- Progress Note Details Patient Name: Date of Service: [name] 02/07/2023 2:45 PM Medical Record Number: [id number] Patient Account Number: [id number] Date of Birth/Sex: Treating RN: [birth year] (77  y.o. F) Primary Care Provider: Eustaquio Boyden Other Clinician: Referring Provider: Treating Provider/Extender: Priscille Loveless in Treatment: 27 Subjective Chief Complaint Information obtained from Patient Patient presents for treatment of an open ulcer due to venous insufficiency History of Present Illness (HPI) ADMISSION This is a 77 year old woman with a history of chronic venous insufficiency status post saphenous vein ablations in 2010 and 2016. 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 She presents today with an open venous ulcer on her left lower leg. It has been present for a little over 2 weeks. She saw her primary care provider who apparently swabbed the wound and grew out Pseudomonas. She just completed a course of ciprofloxacin for this. ABI was 0.91. She reports that she has had previous  issues with ulcers in this same location, stemming back to a punch biopsy taken by a dermatologist many years ago. She has had several skin substitutes applied to the area that have ultimately resulted in healing on prior occasions. She has 2 small ulcers on her left medial lower leg. There is slough accumulation in both of them. The more anterior of the 2 is quite small and has some soft slough on the surface, underneath which there is good granulation tissue. The more medial wound also has slough accumulation, but the underlying surface is fairly fibrotic and gritty. This is consistent with her provided history of multiple ulcers in the same location. 08/03/2022: The anterior wound is smaller today with just a little bit of slough accumulation. The more medial wound continues to be very sensitive and fibrotic with slough buildup. 08/13/2022: The anterior wound is closed. The more medial wound remains sensitive with a fairly fibrotic surface. There is more granulation tissue filling in, however, and there is less slough than on prior  occasions. 08/20/2022: The anterior wound remains closed. The more medial wound has filled with granulation tissue but still has a fair amount of slough on the surface and remains fairly tender. Unfortunately, she has developed 2 small ulcers just proximal to this. The fat layer is exposed in each. She says that over the weekend, she felt a burning sensation in that location. 08/27/2022: The 2 small wounds proximal to the main wound have merged into a single site. The wounds look a little bit dry, but they are quite clean without significant slough accumulation. They remain quite tender. 09/03/2022: All of the wounds have now merged into 1 large wound. There is a strong odor coming from the wound and the surface is not particularly viable. It is extremely painful for her today. 09/10/2022: The wound is less black and purple this week but still does not look particularly viable. The surface is desiccated. The culture that I took last week returned with a polymicrobial population including Pseudomonas. I prescribed both Augmentin and levofloxacin. She has not yet picked up levofloxacin, as her ONI, ORE ([id number]) [phone].pdf Page 7 of 12 pharmacy only notified her yesterday that it was ready. We have ordered a Keystone topical compounded antibiotic, but it has not yet come. 09/17/2022: The wound surface is markedly improved today. There is still an area of grayish-looking muscle but the rest appears significantly more viable. There is still a layer of slough on the surface. She still has a couple days left of oral antibiotic therapy. She has her Jodie Echevaria compounded topical antibiotic with her today. 09/24/2022: She has completed her oral antibiotic therapy. The wound surface is much cleaner today and more viable without any necrotic tissue. It is a bit desiccated, however. 10/01/2022: The moisture balance of the wound has improved. There is a layer of yellow slough  on the surface, but beneath this, the wound is more pink. 10/08/2022: The wound is smaller and cleaner today with a layer of slough present. She is having less pain. 12/18; this patient has a new wound on the right lateral ankle which apparently started after a cat scratched her. Secondarily she managed to drop a cake pan on the same area. This is very painful. The original wound was on the left posterior calf we have been using [employer] under an [employer]. The Unna boot fell down and apparently she was in for a nurse visit perhaps last week and that 1 fell down as well  This patient has central venous mediated hypertension. For member correctly she has a stent placed in the left common iliac artery by Dr. Randie Heinz some years ago [facility] Thurner syndrome] she also has had venous ablations and I believe a history of a DVT The patient has compression pumps at home but she does not use them 10/30/2022: The patient says that her wounds burned all week with the [facility] classic in place. She has a fair amount of slough and nonviable subcutaneous tissue present on the right ankle. The left posterior calf wound is cleaner than I have seen it to date. Both remain fairly tender. 11/14/2022: Both wounds have substantial slough and eschar accumulation. They remain extremely tender. 11/21/2022: The wounds are cleaner this week, but still have slough and eschar accumulation. She continues to describe burning pain in both wounds, but upon further questioning, she actually has burning in both of her feet that radiates up her legs and includes to the wounds. 11/28/2022: Both wounds have slough and eschar accumulation, as well as adherent silver alginate that was difficult to remove. She continues to have pain out of proportion to the extent of her wounds. Her PCP did initiate Lyrica which she started taking last night. The dose is quite low, only 25 mg, but apparently there is a plan for upward titration, assuming  she tolerates the medication. 12/05/2022: The wounds are both a little bit smaller today, but have significant slough accumulation, as usual. They remain exquisitely tender. She is now taking Lyrica twice a day. 12/19/2022: Both of her wounds look significantly better this week. There is slough buildup on both, but they are smaller. She seems to be getting a good response from Lyrica as she is having much less pain. 12/26/2022: The wound on her right lateral ankle is nearly closed. The wound on her left posterior calf is smaller but still has a lot of slough accumulation. They remain tender. 01/02/2023: The right lateral ankle wound is down to just a couple of millimeters. It is much less tender than on prior occasions. There is minimal slough buildup. The wound on her posterior calf continues to contract, as well. It has thick slough buildup and remains fairly tender. 01/09/2023: The right lateral ankle wound is nearly closed. He remains tender. There is just a little eschar on the surface. The wound on her posterior calf has improved quite a bit. There is still slough on the surface, but the more proximal area has epithelialized substantially and there is no longer any gray discoloration to her tissue. 3/13; patient presents for follow-up. Her right lateral ankle wound is almost healed. She has a wound to her left posterior calf with slough and granulation tissue. We have been using Santyl under Unna boots bilaterally to the lower extremities. She has no issues or complaints today. 01/30/2023: The right lateral ankle wound is down to just a pinhole under a layer of eschar. The left posterior calf wound is quite a bit improved since the last time I saw it. There is still a layer of slough on the surface but there has been more epithelialization. 02/07/2023: The right lateral ankle wound still has some eschar on the surface. The left posterior calf wound is about the same size, but with more areas  of epithelialization. Patient History Information obtained from Patient. Family History Cancer - Mother,Paternal Grandparents, Diabetes - Siblings, Heart Disease - Father, Hypertension - Father. Social History Former smoker - quit in 1967. Medical History Eyes Patient has history  of Cataracts - L eye Ear/Nose/Mouth/Throat Denies history of Chronic sinus problems/congestion, Middle ear problems Hematologic/Lymphatic Denies history of Anemia, Human Immunodeficiency Virus, Lymphedema Respiratory Patient has history of Asthma Cardiovascular Patient has history of Hypertension Endocrine Denies history of Type I Diabetes, Type II Diabetes Neurologic Patient has history of Neuropathy - Feet and finger Denies history of Seizure Disorder Oncologic Patient has history of Received Chemotherapy - 2012, Received Radiation - 2012 Hospitalization/Surgery History - Lower extremity venography 2020. - Intravascular ultrasound. - peripheral vascular intervention. - melanoma 2011. 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 no acute distress. Vitals Time Taken: 3:35 PM, Height: 63 in, Weight: 200 lbs, BMI: 35.4, Temperature: 97.6 F, Pulse: 75 bpm, Respiratory Rate: 18 breaths/min, Blood Pressure: 172/94 mmHg. Respiratory Normal work of breathing on room air. General Notes: 02/07/2023: The right lateral ankle wound still has some eschar on the surface. The left posterior calf wound is about the same size, but with more areas of epithelialization. Integumentary (Hair, Skin) Wound #2 status is Open. Original cause of wound was Blister. The date acquired  was: 07/06/2022. The wound has been in treatment 27 weeks. The wound is located on the Left,Posterior Lower Leg. The wound measures 5cm length x 2.7cm width x 0.1cm depth; 10.603cm^2 area and 1.06cm^3 volume. There is Fat Layer (Subcutaneous Tissue) exposed. There is no tunneling or undermining noted. There is a medium amount of serosanguineous drainage noted. The wound margin is distinct with the outline attached to the wound base. There is medium (34-66%) pink granulation within the wound bed. There is a medium (34-66%) amount of necrotic tissue within the wound bed including Adherent Slough. The periwound skin appearance had no abnormalities noted for texture. The periwound skin appearance had no abnormalities noted for moisture. The periwound skin appearance had no abnormalities noted for color. Periwound temperature was noted as No Abnormality. The periwound has tenderness on palpation. Wound #3 status is Open. Original cause of wound was Contusion/Bruise. The date acquired was: 10/19/2022. The wound has been in treatment 15 weeks. The wound is located on the Right,Lateral Ankle. The wound measures 0.1cm length x 0.1cm width x 0.1cm depth; 0.008cm^2 area and 0.001cm^3 volume. There is Fat Layer (Subcutaneous Tissue) exposed. There is no tunneling or undermining noted. There is a none present amount of drainage noted. The wound margin is flat and intact. There is no granulation within the wound bed. There is a large (67-100%) amount of necrotic tissue within the wound bed including Eschar. The periwound skin appearance had no abnormalities noted for texture. The periwound skin appearance had no abnormalities noted for moisture. The periwound skin appearance had no abnormalities noted for color. Periwound temperature was noted as No Abnormality. The periwound has tenderness on palpation. Assessment Active Problems ICD-10 Non-pressure chronic ulcer of other part of left lower leg with fat layer  exposed Non-pressure chronic ulcer of other part of right lower leg with other specified severity Varicose veins of bilateral lower extremities with other complications Venous insufficiency (chronic) (peripheral) Polyneuropathy, unspecified Localized edema Cellulitis of right lower limb Procedures Wound #2 Pre-procedure diagnosis of Wound #2 is a Venous Leg Ulcer located on the Left,Posterior Lower Leg .Severity of Tissue Pre Debridement is: Fat layer exposed. There was a Selective/Open Wound Non-Viable Tissue Debridement with a total  area of 13.5 sq cm performed by Duanne Guessannon, Cartier Mapel, MD. With the following instrument(s): Curette to remove Non-Viable tissue/material. Material [facility] after achieving pain control using Lidocaine 4% Topical Solution. No specimens were taken. A time out was conducted at 15:55, prior to the start of the procedure. A Minimum amount of bleeding was controlled with Pressure. The procedure was tolerated well with a pain level of 7 throughout and a pain level of 3 following the procedure. Post Debridement Measurements: 5cm length x 2.7cm width x 0.1cm depth; 1.06cm^3 volume. Character of Wound/Ulcer Post Debridement is stable. Severity of Tissue Post Debridement is: Fat layer exposed. Post procedure Diagnosis Wound #2: Same as Pre-Procedure General Notes: scribed for Dr. Lady Garyannon by Zenaida DeedLinda Boehlein, RN. Wound #3 Amber BuffyETERS, Carneshia [name] ([id number]) [id number] Page 9 of 12 Pre-procedure diagnosis of Wound #3 is a Venous Leg Ulcer located on the Right,Lateral Ankle .Severity of Tissue Pre Debridement is: Fat layer exposed. There was a Selective/Open Wound Non-Viable Tissue Debridement with a total area of 0.09 sq cm performed by Duanne Guessannon, Kyomi Hector, MD. With the following instrument(s): Curette to remove Non-Viable tissue/material. Material removed includes Eschar after achieving pain control using Lidocaine 4% Topical Solution. No specimens  were taken. A time out was conducted at 15:55, prior to the start of the procedure. There was no bleeding. The procedure was tolerated well with a pain level of 7 throughout and a pain level of 3 following the procedure. Post Debridement Measurements: 0.1cm length x 0.1cm width x 0.1cm depth; 0.001cm^3 volume. Character of Wound/Ulcer Post Debridement requires further debridement. Severity of Tissue Post Debridement is: Fat layer exposed. Post procedure Diagnosis Wound #3: Same as Pre-Procedure General Notes: scribed for Dr. Lady Garyannon by Zenaida DeedLinda Boehlein, RN. Plan Follow-up Appointments: Return Appointment in 1 week. - Dr. Lady Garyannon RM 3 Thursday 4/11 @ 2:45 pm Anesthetic: Wound #2 Left,Posterior Lower Leg: (In clinic) Topical Lidocaine 4% applied to wound bed Wound #3 Right,Lateral Ankle: (In clinic) Topical Lidocaine 4% applied to wound bed Bathing/ Shower/ Hygiene: May shower with protection but do not get wound dressing(s) wet. Protect dressing(s) with water repellant cover (for example, large plastic bag) or a cast cover and may then take shower. Edema Control - Lymphedema / SCD / Other: Lymphedema Pumps. Use Lymphedema pumps on leg(s) 2-3 times a day for 45-60 minutes. If wearing any wraps or hose, do not remove them. Continue exercising as instructed. - Use x1 per day Avoid standing for long periods of time. Exercise regularly WOUND #2: - Lower Leg Wound Laterality: Left, Posterior Cleanser: Soap and Water 1 x Per Week/30 Days Discharge Instructions: May shower and wash wound with dial antibacterial soap and water prior to dressing change. Peri-Wound Care: Sween Lotion (Moisturizing lotion) 1 x Per Week/30 Days Discharge Instructions: Apply moisturizing lotion as directed Topical: Keystone 1 x Per Week/30 Days Discharge Instructions: Apply as directed to wound Prim Dressing: Santyl Ointment 1 x Per Week/30 Days ary Discharge Instructions: Apply nickel thick amount to wound bed as  instructed Secondary Dressing: T Non-adherent Dressing, 2x3 in 1 x Per Week/30 Days elfa Discharge Instructions: Apply over primary dressing as directed. Secondary Dressing: Zetuvit Plus 4x8 in 1 x Per Week/30 Days Discharge Instructions: Apply over primary dressing as directed. Secured With: 45M Medipore Scientist, research (life sciences)oft Cloth Surgical T 2x10 (in/yd) 1 x Per Week/30 Days ape Discharge Instructions: Secure with tape as directed. Com pression Wrap: CoFlex Calamine Unna Boot 4 x 6 (in/yd) 1 x Per Week/30 Days Discharge Instructions: Apply Coflex Calamine  [employer] as directed. WOUND #3: - Ankle Wound Laterality: Right, Lateral Cleanser: Soap and Water 1 x Per Week/30 Days Discharge Instructions: May shower and wash wound with dial antibacterial soap and water prior to dressing change. Peri-Wound Care: Sween Lotion (Moisturizing lotion) 1 x Per Week/30 Days Discharge Instructions: Apply moisturizing lotion as directed Prim Dressing: Santyl Ointment 1 x Per Week/30 Days ary Discharge Instructions: Apply nickel thick amount to wound bed as instructed Secondary Dressing: T Non-adherent Dressing, 2x3 in 1 x Per Week/30 Days elfa Discharge Instructions: Apply over primary dressing as directed. Secondary Dressing: Woven Gauze Sponge, Non-Sterile 4x4 in 1 x Per Week/30 Days Discharge Instructions: Apply over primary dressing as directed. Secured With: 18M Medipore Scientist, research (life sciences) Surgical T 2x10 (in/yd) 1 x Per Week/30 Days ape Discharge Instructions: Secure with tape as directed. Com pression Wrap: CoFlex Calamine Unna Boot 4 x 6 (in/yd) 1 x Per Week/30 Days Discharge Instructions: Apply Coflex Calamine [employer] as directed. 02/07/2023: The right lateral ankle wound still has some eschar on the surface. The left posterior calf wound is about the same size, but with more areas of epithelialization. I used a curette to debride both wounds. I was able to get most of the eschar off of the right lateral ankle wound but  some of it was quite adherent and it was too painful for the patient for me to try to get the rest of it off. I was able to debride the slough off of the left posterior calf wound. We will apply Santyl to the right ankle wound and Santyl mixed with Keystone topical antibiotic compound (prescription drug) to the left posterior calf wound. Continue bilateral calamine Coflex compression wraps. Follow-up in 1 week. Electronic Signature(s) Signed: 02/08/2023 12:19:37 PM By: Duanne Guess MD FACS Signed: 02/08/2023 1:00:20 PM By: Shawn Stall RN, [name] Previous Signature: 02/07/2023 4:01:41 PM Version By: Duanne Guess MD FACS Entered By: Shawn Stall on 02/08/2023 11:29:06 HxROS Details -------------------------------------------------------------------------------- [name] ([id number]) [id number] Page 10 of 12 Patient Name: Date of Service: [name], [name] 02/07/2023 2:45 PM Medical Record Number: [id number] Patient Account Number: [id number] Date of Birth/Sex: Treating RN: 19-Feb-1946 (77 y.o. F) Primary Care Provider: Eustaquio Boyden Other Clinician: Referring Provider: Treating Provider/Extender: Priscille Loveless in Treatment: 27 Information Obtained From Patient Eyes Medical History: Positive for: Cataracts - L eye Ear/Nose/Mouth/Throat Medical History: Negative for: Chronic sinus problems/congestion; Middle ear problems Hematologic/Lymphatic Medical History: Negative for: Anemia; Human Immunodeficiency Virus; Lymphedema Respiratory Medical History: Positive for: Asthma Past Medical History Notes: OSA on CPAP Cardiovascular Medical History: Positive for: Hypertension Past Medical History Notes: Cronic venous insufficiency 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 Gastrointestinal Medical History: Past Medical History Notes: liver cyst Endocrine Medical History: Negative for: Type I Diabetes;  Type II Diabetes Musculoskeletal Medical History: Past Medical History Notes: arthritis Neurologic Medical History: Positive for: Neuropathy - Feet and finger Negative for: Seizure Disorder Past Medical History Notes: restless leg syndrome Oncologic Medical History: Positive for: Received Chemotherapy - 2012; Received Radiation - 2012 HBO Extended History Items Eyes: Cataracts Immunizations Pneumococcal Vaccine: CHENITA, RUDA ([id number]) [id number] Page 11 of 12 Received Pneumococcal Vaccination: Yes Received Pneumococcal Vaccination On or After 60th Birthday: Yes Implantable Devices No devices added Hospitalization / Surgery History Type of Hospitalization/Surgery Lower extremity venography 2020 Intravascular ultrasound peripheral vascular intervention melanoma 2011 Family and Social History Cancer: Yes - Mother,Paternal Grandparents; Diabetes: Yes - Siblings; Heart Disease: Yes - Father; Hypertension: Yes - Father;  Former smoker - quit in [employer]) Signed: 02/07/2023 4:02:38 PM By: Duanne Guess MD FACS Entered By: Duanne Guess on 02/07/2023 15:59:42 -------------------------------------------------------------------------------- SuperBill Details Patient Name: Date of Service: [name], [name] 02/07/2023 Medical Record Number: [id number] Patient Account Number: [id number] Date of Birth/Sex: Treating RN: 12/11/1945 (77 y.o. F) Primary Care Provider: Eustaquio Boyden Other Clinician: Referring Provider: Treating Provider/Extender: Priscille Loveless in Treatment: 27 Diagnosis Coding ICD-10 Codes Code Description [phone] Non-pressure chronic ulcer of other part of left lower leg with fat layer exposed L97.818 Non-pressure chronic ulcer of other part of right lower leg with other specified severity I83.893 Varicose veins of bilateral lower extremities with other complications I87.2 Venous  insufficiency (chronic) (peripheral) G62.9 Polyneuropathy, unspecified R60.0 Localized edema L03.115 Cellulitis of right lower limb Facility Procedures : CPT4 Code: [id number] Description: 97597 - DEBRIDE WOUND 1ST 20 SQ CM OR < ICD-10 Diagnosis Description L97.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d L97.818 Non-pressure chronic ulcer of other part of right lower leg with other  specified s Modifier: everity Quantity: 1 Physician Procedures : CPT4 Code Description Modifier [id number] 99214 - WC PHYS LEVEL 4 - EST PT 25 ICD-10 Diagnosis Description L97.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d L97.818 Non-pressure chronic ulcer of other part of right  lower leg with other specified severity I87.2 Venous insufficiency (chronic) (peripheral) G62.9 Polyneuropathy, unspecified Quantity: 1 Electronic Signature(s) Signed: 02/07/2023 4:02:04 PM By: Duanne Guess MD FACS Entered By: Duanne Guess on 02/07/2023 16:02:04

## 2023-02-14 ENCOUNTER — Encounter (HOSPITAL_BASED_OUTPATIENT_CLINIC_OR_DEPARTMENT_OTHER): Payer: Medicare Other | Attending: General Surgery | Admitting: General Surgery

## 2023-02-14 DIAGNOSIS — G629 Polyneuropathy, unspecified: Secondary | ICD-10-CM | POA: Diagnosis not present

## 2023-02-14 DIAGNOSIS — L97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d: Secondary | ICD-10-CM | POA: Diagnosis not present

## 2023-02-14 DIAGNOSIS — L97818 Non-pressure chronic ulcer of other part of right lower leg with other specified severity: Secondary | ICD-10-CM | POA: Diagnosis not present

## 2023-02-14 DIAGNOSIS — L03115 Cellulitis of right lower limb: Secondary | ICD-10-CM | POA: Diagnosis not present

## 2023-02-14 DIAGNOSIS — G4733 Obstructive sleep apnea (adult) (pediatric): Secondary | ICD-10-CM | POA: Diagnosis not present

## 2023-02-14 DIAGNOSIS — I10 Essential (primary) hypertension: Secondary | ICD-10-CM | POA: Diagnosis not present

## 2023-02-14 NOTE — Progress Notes (Signed)
VALERYE, TINNER ([id number]) [id number] Page 1 of 1 Visit Report for 02/14/2023 SuperBill Details Patient Name: Date of Service: [name], [name] 02/14/2023 Medical Record Number: [id number] Patient Account Number: [id number] Date of Birth/Sex: Treating RN: Nov 01, 1946 (77 y.o. [name] Primary Care Provider: Eustaquio Boyden Other Clinician: Referring Provider: Treating Provider/Extender: Priscille Loveless in Treatment: 28 Diagnosis Coding ICD-10 Codes Code Description [phone] Non-pressure chronic ulcer of other part of left lower leg with fat layer exposed L97.818 Non-pressure chronic ulcer of other part of right lower leg with other specified severity I83.893 Varicose veins of bilateral lower extremities with other complications I87.2 Venous insufficiency (chronic) (peripheral) G62.9 Polyneuropathy, unspecified R60.0 Localized edema L03.115 Cellulitis of right lower limb Facility Procedures CPT4 Description Modifier Quantity Code [id number] [id number] BILATERAL: Application of multi-layer venous compression system; leg (below knee), including ankle and 1 foot. ICD-10 Diagnosis Description L97.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d L97.818 Non-pressure chronic ulcer of other part of right lower leg with other specified severity Electronic Signature(s) Signed: 02/14/2023 3:58:28 PM By: Samuella Bruin Signed: 02/14/2023 4:00:14 PM By: Duanne Guess MD FACS Entered By: Samuella Bruin on 02/14/2023 15:56:21

## 2023-02-18 ENCOUNTER — Telehealth: Payer: Self-pay

## 2023-02-18 NOTE — Telephone Encounter (Signed)
Unable to reach pt by phone; will try again later.

## 2023-02-18 NOTE — Telephone Encounter (Signed)
Agree with recommendations made. Thank you.

## 2023-02-18 NOTE — Telephone Encounter (Signed)
I spoke with pt and she did go to new [facility] in [place] over the weekend and pt said was dressed with coban but this morning the coban has slid down. Pt said she is going to call the wound care office to see if they can redress her leg. Advised pt would go ahead and call to see if dressing can be redone at wound center. Pt said she has not been up long and she will call wound care to get dressing redone. Sending note to Dr Reece Agar as Lorain Childes. Pt said she would cb if needed.

## 2023-02-20 ENCOUNTER — Encounter (HOSPITAL_BASED_OUTPATIENT_CLINIC_OR_DEPARTMENT_OTHER): Payer: Medicare Other | Admitting: General Surgery

## 2023-02-20 DIAGNOSIS — I10 Essential (primary) hypertension: Secondary | ICD-10-CM | POA: Diagnosis not present

## 2023-02-20 DIAGNOSIS — L03115 Cellulitis of right lower limb: Secondary | ICD-10-CM | POA: Diagnosis not present

## 2023-02-20 DIAGNOSIS — L97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d: Secondary | ICD-10-CM | POA: Diagnosis not present

## 2023-02-20 DIAGNOSIS — L97222 Non-pressure chronic ulcer of left calf with fat layer exposed: Secondary | ICD-10-CM | POA: Diagnosis not present

## 2023-02-20 DIAGNOSIS — I872 Venous insufficiency (chronic) (peripheral): Secondary | ICD-10-CM | POA: Diagnosis not present

## 2023-02-20 DIAGNOSIS — L97818 Non-pressure chronic ulcer of other part of right lower leg with other specified severity: Secondary | ICD-10-CM | POA: Diagnosis not present

## 2023-02-20 DIAGNOSIS — G629 Polyneuropathy, unspecified: Secondary | ICD-10-CM | POA: Diagnosis not present

## 2023-02-20 DIAGNOSIS — G4733 Obstructive sleep apnea (adult) (pediatric): Secondary | ICD-10-CM | POA: Diagnosis not present

## 2023-02-21 NOTE — Progress Notes (Signed)
[name] ([id number]) [id number] Page 1 of 10 Visit Report for 02/20/2023 Chief Complaint Document Details Patient Name: Date of Service: [name], [name] 02/20/2023 2:00 PM Medical Record Number: [id number] Patient Account Number: [id number] Date of Birth/Sex: Treating RN: Aug 04, 1946 (77 y.o. F) Primary Care Provider: Eustaquio Boyden Other Clinician: Referring Provider: Treating Provider/Extender: Priscille Loveless in Treatment: 29 Information Obtained from: Patient Chief Complaint Patient presents for treatment of an open ulcer due to venous insufficiency Electronic Signature(s) Signed: 02/20/2023 3:13:17 PM By: Duanne Guess MD FACS Entered By: Duanne Guess on 02/20/2023 15:13:17 -------------------------------------------------------------------------------- Debridement Details Patient Name: Date of Service: [name]. 02/20/2023 2:00 PM Medical Record Number: [id number] Patient Account Number: [id number] Date of Birth/Sex: Treating RN: 1946/07/15 (77 y.o. Katrinka Blazing Primary Care Provider: Eustaquio Boyden Other Clinician: Referring Provider: Treating Provider/Extender: Priscille Loveless in Treatment: 29 Debridement Performed for Assessment: Wound #2 Left,Posterior Lower Leg Performed By: Physician Duanne Guess, MD Debridement Type: Debridement Severity of Tissue Pre Debridement: Fat layer exposed Level of Consciousness (Pre-procedure): Awake and Alert Pre-procedure Verification/Time Out Yes - 14:25 Taken: Start Time: 14:25 Pain Control: Lidocaine 5% topical ointment T Area Debrided (L x W): otal 4 (cm) x 2.5 (cm) = 10 (cm) Tissue and other material debrided: Non-Viable, Slough, Slough Level: Non-Viable Tissue Debridement Description: Selective/Open Wound Instrument: Curette Bleeding: Minimum Hemostasis Achieved: Pressure End Time: 14:27 Procedural Pain: 0 Post  Procedural Pain: 0 Response to Treatment: Procedure was tolerated well Level of Consciousness (Post- Awake and Alert procedure): Post Debridement Measurements of Total Wound Length: (cm) 4 Width: (cm) 2.5 Depth: (cm) 0.1 Volume: (cm) 0.785 Character of Wound/Ulcer Post Debridement: Improved Severity of Tissue Post Debridement: Fat layer exposed Post Procedure Diagnosis Same as Pre-procedure Notes Scribed for Dr. Lady Gary by J.Scotton Electronic Signature(s) CLOTEAL, ISAACSON ([id number]) [id number] Page 2 of 10 Signed: 02/20/2023 3:59:24 PM By: Duanne Guess MD FACS Signed: 02/20/2023 4:25:55 PM By: Karie Schwalbe RN Entered By: Karie Schwalbe on 02/20/2023 14:30:01 -------------------------------------------------------------------------------- HPI Details Patient Name: Date of Service: [name] 02/20/2023 2:00 PM Medical Record Number: [id number] Patient Account Number: [id number] Date of Birth/Sex: Treating RN: 12-Mar-1946 (77 y.o. F) Primary Care Provider: Eustaquio Boyden Other Clinician: Referring Provider: Treating Provider/Extender: Priscille Loveless in Treatment: 29 History of Present Illness HPI Description: ADMISSION This is a 77 year old woman with a history of chronic venous insufficiency status post saphenous vein ablations in 2010 and 2016. 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 She presents today with an open venous ulcer on her left lower leg. It has been present for a little over 2 weeks. She saw her primary care provider who apparently swabbed the wound and grew out Pseudomonas. She just completed a course of ciprofloxacin for this. ABI was 0.91. She reports that she has had previous issues with ulcers in this same location, stemming back to a punch biopsy taken by a dermatologist many years ago. She has had several skin substitutes applied to the area that have ultimately  resulted in healing on prior occasions. She has 2 small ulcers on her left medial lower leg. There is slough accumulation in both of them. The more anterior of the 2 is quite small and has some soft slough on the surface, underneath which there is good granulation tissue. The more medial wound also has slough accumulation, but the underlying surface is fairly fibrotic and gritty. This is consistent with her provided history of multiple ulcers  in the same location. 08/03/2022: The anterior wound is smaller today with just a little bit of slough accumulation. The more medial wound continues to be very sensitive and fibrotic with slough buildup. 08/13/2022: The anterior wound is closed. The more medial wound remains sensitive with a fairly fibrotic surface. There is more granulation tissue filling in, however, and there is less slough than on prior occasions. 08/20/2022: The anterior wound remains closed. The more medial wound has filled with granulation tissue but still has a fair amount of slough on the surface and remains fairly tender. Unfortunately, she has developed 2 small ulcers just proximal to this. The fat layer is exposed in each. She says that over the weekend, she felt a burning sensation in that location. 08/27/2022: The 2 small wounds proximal to the main wound have merged into a single site. The wounds look a little bit dry, but they are quite clean without significant slough accumulation. They remain quite tender. 09/03/2022: All of the wounds have now merged into 1 large wound. There is a strong odor coming from the wound and the surface is not particularly viable. It is extremely painful for her today. 09/10/2022: The wound is less black and purple this week but still does not look particularly viable. The surface is desiccated. The culture that I took last week returned with a polymicrobial population including Pseudomonas. I prescribed both Augmentin and levofloxacin. She has not yet  picked up levofloxacin, as her pharmacy only notified her yesterday that it was ready. We have ordered a Keystone topical compounded antibiotic, but it has not yet come. 09/17/2022: The wound surface is markedly improved today. There is still an area of grayish-looking muscle but the rest appears significantly more viable. There is still a layer of slough on the surface. She still has a couple days left of oral antibiotic therapy. She has her Jodie Echevaria compounded topical antibiotic with her today. 09/24/2022: She has completed her oral antibiotic therapy. The wound surface is much cleaner today and more viable without any necrotic tissue. It is a bit desiccated, however. 10/01/2022: The moisture balance of the wound has improved. There is a layer of yellow slough on the surface, but beneath this, the wound is more pink. 10/08/2022: The wound is smaller and cleaner today with a layer of slough present. She is having less pain. 12/18; this patient has a new wound on the right lateral ankle which apparently started after a cat scratched her. Secondarily she managed to drop a cake pan on the same area. This is very painful. The original wound was on the left posterior calf we have been using [employer] under an [employer]. The Unna boot fell down and apparently she was in for a nurse visit perhaps last week and that 1 fell down as well This patient has central venous mediated hypertension. For member correctly she has a stent placed in the left common iliac artery by Dr. Randie Heinz some years ago [facility] Thurner syndrome] she also has had venous ablations and I believe a history of a DVT The patient has compression pumps at home but she does not use them 10/30/2022: The patient says that her wounds burned all week with the [facility] classic in place. She has a fair amount of slough and nonviable subcutaneous tissue present on the right ankle. The left posterior calf wound is cleaner than I have seen it to  date. Both remain fairly tender. 11/14/2022: Both wounds have substantial slough and eschar accumulation. They  remain extremely tender. 11/21/2022: The wounds are cleaner this week, but still have slough and eschar accumulation. She continues to describe burning pain in both wounds, but upon further questioning, she actually has burning in both of her feet that radiates up her legs and includes to the wounds. 11/28/2022: Both wounds have slough and eschar accumulation, as well as adherent silver alginate that was difficult to remove. She continues to have pain out of proportion to the extent of her wounds. Her PCP did initiate Lyrica which she started taking last night. The dose is quite low, only 25 mg, but apparently there is a plan for upward titration, assuming she tolerates the medication. 12/05/2022: The wounds are both a little bit smaller today, but have significant slough accumulation, as usual. They remain exquisitely tender. She is now taking Lyrica twice a day. THIA, OLESEN ([id number]) [id number] Page 3 of 10 12/19/2022: Both of her wounds look significantly better this week. There is slough buildup on both, but they are smaller. She seems to be getting a good response from Lyrica as she is having much less pain. 12/26/2022: The wound on her right lateral ankle is nearly closed. The wound on her left posterior calf is smaller but still has a lot of slough accumulation. They remain tender. 01/02/2023: The right lateral ankle wound is down to just a couple of millimeters. It is much less tender than on prior occasions. There is minimal slough buildup. The wound on her posterior calf continues to contract, as well. It has thick slough buildup and remains fairly tender. 01/09/2023: The right lateral ankle wound is nearly closed. He remains tender. There is just a little eschar on the surface. The wound on her posterior calf has improved quite a bit. There is still  slough on the surface, but the more proximal area has epithelialized substantially and there is no longer any gray discoloration to her tissue. 3/13; patient presents for follow-up. Her right lateral ankle wound is almost healed. She has a wound to her left posterior calf with slough and granulation tissue. We have been using Santyl under Unna boots bilaterally to the lower extremities. She has no issues or complaints today. 01/30/2023: The right lateral ankle wound is down to just a pinhole under a layer of eschar. The left posterior calf wound is quite a bit improved since the last time I saw it. There is still a layer of slough on the surface but there has been more epithelialization. 02/07/2023: The right lateral ankle wound still has some eschar on the surface. The left posterior calf wound is about the same size, but with more areas of epithelialization. 02/20/2023: The right lateral ankle wound is healed. The left posterior calf wound is smaller and is essentially flush with the surrounding skin, but the surface is quite dry. Her leg wrap slipped and her calf is quite swollen above the level of the wound. Electronic Signature(s) Signed: 02/20/2023 3:14:29 PM By: Duanne Guess MD FACS Entered By: Duanne Guess on 02/20/2023 15:14:28 -------------------------------------------------------------------------------- Physical Exam Details Patient Name: Date of Service: KHRISTY, KALAN 02/20/2023 2:00 PM Medical Record Number: [id number] Patient Account Number: [id number] Date of Birth/Sex: Treating RN: 1945-11-27 (77 y.o. F) Primary Care Provider: Eustaquio Boyden Other Clinician: Referring Provider: Treating Provider/Extender: Priscille Loveless in Treatment: 29 Constitutional Hypertensive, asymptomatic. . . . no acute distress. Respiratory Normal work of breathing on room air. Notes 02/20/2023: The right lateral ankle wound is healed. The left posterior calf wound  is smaller and is essentially flush with the surrounding skin, but the surface is quite dry. Her leg wrap slipped and her calf is quite swollen above the level of the wound. Electronic Signature(s) Signed: 02/20/2023 3:17:52 PM By: Duanne Guess MD FACS Entered By: Duanne Guess on 02/20/2023 15:17:52 -------------------------------------------------------------------------------- Physician Orders Details Patient Name: Date of Service: REYNALDA, CANNY 02/20/2023 2:00 PM Medical Record Number: [id number] Patient Account Number: [id number] Date of Birth/Sex: Treating RN: 10/22/1946 (77 y.o. Katrinka Blazing Primary Care Provider: Eustaquio Boyden Other Clinician: Referring Provider: Treating Provider/Extender: Priscille Loveless in Treatment: 29 Verbal / Phone Orders: No Diagnosis Coding ICD-10 Coding Code Description 3860202052 Non-pressure chronic ulcer of other part of left lower leg with fat layer exposed I83.893 Varicose veins of bilateral lower extremities with other complications I87.2 Venous insufficiency (chronic) (peripheral) G62.9 Polyneuropathy, unspecified MARIJEAN, MONTANYE ([id number]) [id number] Page 4 of 10 R60.0 Localized edema L03.115 Cellulitis of right lower limb Follow-up Appointments ppointment in 1 week. - Dr. Lady Gary Room 3 Return A Anesthetic Wound #2 Left,Posterior Lower Leg (In clinic) Topical Lidocaine 4% applied to wound bed Bathing/ Shower/ Hygiene May shower with protection but do not get wound dressing(s) wet. Protect dressing(s) with water repellant cover (for example, large plastic bag) or a cast cover and may then take shower. Edema Control - Lymphedema / SCD / Other Left Lower Extremity Lymphedema Pumps. Use Lymphedema pumps on leg(s) 2-3 times a day for 45-60 minutes. If wearing any wraps or hose, do not remove them. Continue exercising as instructed. - Use x1 per day Avoid standing for long  periods of time. Exercise regularly Wound Treatment Wound #2 - Lower Leg Wound Laterality: Left, Posterior Cleanser: Soap and Water 1 x Per Week/30 Days Discharge Instructions: May shower and wash wound with dial antibacterial soap and water prior to dressing change. Peri-Wound Care: Sween Lotion (Moisturizing lotion) 1 x Per Week/30 Days Discharge Instructions: Apply moisturizing lotion as directed Topical: Keystone 1 x Per Week/30 Days Discharge Instructions: On hold 02/20/23 Prim Dressing: Santyl Ointment 1 x Per Week/30 Days ary Discharge Instructions: Apply nickel thick amount to wound bed as instructed Secondary Dressing: T Non-adherent Dressing, 2x3 in 1 x Per Week/30 Days elfa Discharge Instructions: Apply over primary dressing as directed. Secondary Dressing: Zetuvit Plus 4x8 in 1 x Per Week/30 Days Discharge Instructions: Apply over primary dressing as directed. Secured With: 57M Medipore Scientist, research (life sciences) Surgical T 2x10 (in/yd) 1 x Per Week/30 Days ape Discharge Instructions: Secure with tape as directed. Compression Wrap: Unnaboot w/Calamine, 4x10 (in/yd) 1 x Per Week/30 Days Discharge Instructions: Apply Unnaboot as directed. Electronic Signature(s) Signed: 02/20/2023 3:59:24 PM By: Duanne Guess MD FACS Entered By: Duanne Guess on 02/20/2023 15:20:05 -------------------------------------------------------------------------------- Problem List Details Patient Name: Date of Service: SHIVAUN, BILELLO 02/20/2023 2:00 PM Medical Record Number: [id number] Patient Account Number: [id number] Date of Birth/Sex: Treating RN: 1946/10/13 (77 y.o. F) Primary Care Provider: Eustaquio Boyden Other Clinician: Referring Provider: Treating Provider/Extender: Priscille Loveless in Treatment: 29 Active Problems ICD-10 Encounter Code Description Active Date MDM Diagnosis L97.822 Non-pressure chronic ulcer of other part of left lower leg with fat layer  exposed9/22/2023 No Yes I83.893 Varicose veins of bilateral lower extremities with other complications 07/27/2022 No Yes IRINI, LEET ([id number]) [id number] Page 5 of 10 I87.2 Venous insufficiency (chronic) (peripheral) 07/27/2022 No Yes G62.9 Polyneuropathy, unspecified 07/27/2022 No Yes R60.0 Localized edema 07/27/2022 No Yes L03.115 Cellulitis of right lower limb 10/22/2022 No Yes Inactive Problems Resolved  Problems ICD-10 Code Description Active Date Resolved Date L97.818 Non-pressure chronic ulcer of other part of right lower leg with other specified severity 10/22/2022 10/22/2022 Electronic Signature(s) Signed: 02/20/2023 3:12:44 PM By: Duanne Guess MD FACS Entered By: Duanne Guess on 02/20/2023 15:12:44 -------------------------------------------------------------------------------- Progress Note Details Patient Name: Date of Service: [name]. 02/20/2023 2:00 PM Medical Record Number: [id number] Patient Account Number: [id number] Date of Birth/Sex: Treating RN: 07-[name]-1946 (77 y.o. F) Primary Care Provider: Eustaquio Boyden Other Clinician: Referring Provider: Treating Provider/Extender: Priscille Loveless in Treatment: 29 Subjective Chief Complaint Information obtained from Patient Patient presents for treatment of an open ulcer due to venous insufficiency History of Present Illness (HPI) ADMISSION This is a 77 year old woman with a history of chronic venous insufficiency status post saphenous vein ablations in 2010 and 2016. 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 She presents today with an open venous ulcer on her left lower leg. It has been present for a little over 2 weeks. She saw her primary care provider who apparently swabbed the wound and grew out Pseudomonas. She just completed a course of ciprofloxacin for this. ABI was 0.91. She reports that she has had previous issues  with ulcers in this same location, stemming back to a punch biopsy taken by a dermatologist many years ago. She has had several skin substitutes applied to the area that have ultimately resulted in healing on prior occasions. She has 2 small ulcers on her left medial lower leg. There is slough accumulation in both of them. The more anterior of the 2 is quite small and has some soft slough on the surface, underneath which there is good granulation tissue. The more medial wound also has slough accumulation, but the underlying surface is fairly fibrotic and gritty. This is consistent with her provided history of multiple ulcers in the same location. 08/03/2022: The anterior wound is smaller today with just a little bit of slough accumulation. The more medial wound continues to be very sensitive and fibrotic with slough buildup. 08/13/2022: The anterior wound is closed. The more medial wound remains sensitive with a fairly fibrotic surface. There is more granulation tissue filling in, however, and there is less slough than on prior occasions. 08/20/2022: The anterior wound remains closed. The more medial wound has filled with granulation tissue but still has a fair amount of slough on the surface and remains fairly tender. Unfortunately, she has developed 2 small ulcers just proximal to this. The fat layer is exposed in each. She says that over the weekend, she felt a burning sensation in that location. 08/27/2022: The 2 small wounds proximal to the main wound have merged into a single site. The wounds look a little bit dry, but they are quite clean without significant slough accumulation. They remain quite tender. 09/03/2022: All of the wounds have now merged into 1 large wound. There is a strong odor coming from the wound and the surface is not particularly viable. It is extremely painful for her today. LYLIAN, SANAGUSTIN ([id number]) [id number] Page 6 of 10 09/10/2022: The wound  is less black and purple this week but still does not look particularly viable. The surface is desiccated. The culture that I took last week returned with a polymicrobial population including Pseudomonas. I prescribed both Augmentin and levofloxacin. She has not yet picked up levofloxacin, as her pharmacy only notified her yesterday that it was ready. We have ordered a Keystone topical compounded antibiotic, but it has not yet  come. 09/17/2022: The wound surface is markedly improved today. There is still an area of grayish-looking muscle but the rest appears significantly more viable. There is still a layer of slough on the surface. She still has a couple days left of oral antibiotic therapy. She has her Jodie Echevaria compounded topical antibiotic with her today. 09/24/2022: She has completed her oral antibiotic therapy. The wound surface is much cleaner today and more viable without any necrotic tissue. It is a bit desiccated, however. 10/01/2022: The moisture balance of the wound has improved. There is a layer of yellow slough on the surface, but beneath this, the wound is more pink. 10/08/2022: The wound is smaller and cleaner today with a layer of slough present. She is having less pain. 12/18; this patient has a new wound on the right lateral ankle which apparently started after a cat scratched her. Secondarily she managed to drop a cake pan on the same area. This is very painful. The original wound was on the left posterior calf we have been using [employer] under an [employer]. The Unna boot fell down and apparently she was in for a nurse visit perhaps last week and that 1 fell down as well This patient has central venous mediated hypertension. For member correctly she has a stent placed in the left common iliac artery by Dr. Randie Heinz some years ago [facility] Thurner syndrome] she also has had venous ablations and I believe a history of a DVT The patient has compression pumps at home but she does not use  them 10/30/2022: The patient says that her wounds burned all week with the [facility] classic in place. She has a fair amount of slough and nonviable subcutaneous tissue present on the right ankle. The left posterior calf wound is cleaner than I have seen it to date. Both remain fairly tender. 11/14/2022: Both wounds have substantial slough and eschar accumulation. They remain extremely tender. 11/21/2022: The wounds are cleaner this week, but still have slough and eschar accumulation. She continues to describe burning pain in both wounds, but upon further questioning, she actually has burning in both of her feet that radiates up her legs and includes to the wounds. 11/28/2022: Both wounds have slough and eschar accumulation, as well as adherent silver alginate that was difficult to remove. She continues to have pain out of proportion to the extent of her wounds. Her PCP did initiate Lyrica which she started taking last night. The dose is quite low, only 25 mg, but apparently there is a plan for upward titration, assuming she tolerates the medication. 12/05/2022: The wounds are both a little bit smaller today, but have significant slough accumulation, as usual. They remain exquisitely tender. She is now taking Lyrica twice a day. 12/19/2022: Both of her wounds look significantly better this week. There is slough buildup on both, but they are smaller. She seems to be getting a good response from Lyrica as she is having much less pain. 12/26/2022: The wound on her right lateral ankle is nearly closed. The wound on her left posterior calf is smaller but still has a lot of slough accumulation. They remain tender. 01/02/2023: The right lateral ankle wound is down to just a couple of millimeters. It is much less tender than on prior occasions. There is minimal slough buildup. The wound on her posterior calf continues to contract, as well. It has thick slough buildup and remains fairly tender. 01/09/2023: The  right lateral ankle wound is nearly closed. He remains tender.  There is just a little eschar on the surface. The wound on her posterior calf has improved quite a bit. There is still slough on the surface, but the more proximal area has epithelialized substantially and there is no longer any gray discoloration to her tissue. 3/13; patient presents for follow-up. Her right lateral ankle wound is almost healed. She has a wound to her left posterior calf with slough and granulation tissue. We have been using Santyl under Unna boots bilaterally to the lower extremities. She has no issues or complaints today. 01/30/2023: The right lateral ankle wound is down to just a pinhole under a layer of eschar. The left posterior calf wound is quite a bit improved since the last time I saw it. There is still a layer of slough on the surface but there has been more epithelialization. 02/07/2023: The right lateral ankle wound still has some eschar on the surface. The left posterior calf wound is about the same size, but with more areas of epithelialization. 02/20/2023: The right lateral ankle wound is healed. The left posterior calf wound is smaller and is essentially flush with the surrounding skin, but the surface is quite dry. Her leg wrap slipped and her calf is quite swollen above the level of the wound. Patient History Information obtained from Patient. Family History Cancer - Mother,Paternal Grandparents, Diabetes - Siblings, Heart Disease - Father, Hypertension - Father. Social History Former smoker - quit in 1967. Medical History Eyes Patient has history of Cataracts - L eye Ear/Nose/Mouth/Throat Denies history of Chronic sinus problems/congestion, Middle ear problems Hematologic/Lymphatic Denies history of Anemia, Human Immunodeficiency Virus, Lymphedema Respiratory Patient has history of Asthma Cardiovascular Patient has history of Hypertension Endocrine Denies history of Type I Diabetes, Type II  Diabetes Neurologic Patient has history of Neuropathy - Feet and finger SHOSHANAH, DAPPER ([id number]) [id number] Page 7 of 10 Denies history of Seizure Disorder Oncologic Patient has history of Received Chemotherapy - 2012, Received Radiation - 2012 Hospitalization/Surgery History - Lower extremity venography 2020. - Intravascular ultrasound. - peripheral vascular intervention. - melanoma 2011. 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 no acute distress. Vitals Time Taken: 1:48 AM, Height: 63 in, Weight: 200 lbs, BMI: 35.4, Temperature: 98.3 F, Pulse: 83 bpm, Respiratory Rate: 20 breaths/min, Blood Pressure: 158/84 mmHg. Respiratory Normal work of breathing on room air. General Notes: 02/20/2023: The right lateral ankle wound is healed. The left posterior calf wound is smaller and is essentially flush with the surrounding skin, but the surface is quite dry. Her leg wrap slipped and her calf is quite swollen above the level of the wound. Integumentary (Hair, Skin) Wound #2 status is Open. Original cause of wound was Blister. The date acquired was: 07/06/2022. The wound has been in treatment 29 weeks. The wound is located on the Left,Posterior Lower Leg. The wound measures 4cm length x 2.5cm width x 0.1cm depth; 7.854cm^2 area and 0.785cm^3 volume. There is Fat Layer (Subcutaneous Tissue) exposed. There is no tunneling or undermining noted. There is a medium amount of serosanguineous drainage noted. The wound margin is distinct with the outline attached to the wound base. There is medium (34-66%) pink granulation within the wound bed. There is a medium (34-66%) amount of necrotic tissue within the  wound bed including Adherent Slough. The periwound skin appearance had no abnormalities noted for texture. The  periwound skin appearance had no abnormalities noted for moisture. The periwound skin appearance had no abnormalities noted for color. Periwound temperature was noted as No Abnormality. The periwound has tenderness on palpation. Wound #3 status is Healed - Epithelialized. Original cause of wound was Contusion/Bruise. The date acquired was: 10/19/2022. The wound has been in treatment 17 weeks. The wound is located on the Right,Lateral Ankle. The wound measures 0cm length x 0cm width x 0cm depth; 0cm^2 area and 0cm^3 volume. There is no tunneling or undermining noted. There is a none present amount of drainage noted. The wound margin is flat and intact. There is no granulation within the wound bed. There is no necrotic tissue within the wound bed. The periwound skin appearance had no abnormalities noted for texture. The periwound skin appearance had no abnormalities noted for moisture. The periwound skin appearance had no abnormalities noted for color. Periwound temperature was noted as No Abnormality. The periwound has tenderness on palpation. Assessment Active Problems ICD-10 Non-pressure chronic ulcer of other part of left lower leg with fat layer exposed Varicose veins of bilateral lower extremities with other complications Venous insufficiency (chronic) (peripheral) Polyneuropathy, unspecified Localized edema Cellulitis of right lower limb Procedures Wound #2 Pre-procedure diagnosis of Wound #2 is a Venous Leg Ulcer located on the Left,Posterior Lower Leg .Severity of Tissue Pre Debridement is: Fat layer exposed. There was a Selective/Open Wound Non-Viable Tissue Debridement with a total area of 10 sq cm performed by Duanne Guess, MD. With the following instrument(s): Curette to remove Non-Viable tissue/material. Material removed includes [facility] after achieving pain control  using Lidocaine 5% topical ointment. No specimens were taken. A time out was conducted at 14:25, prior to the start of the procedure. A Minimum amount of bleeding was controlled with Pressure. The procedure was tolerated well with a pain level of 0 throughout and a pain level of 0 following the procedure. Post Debridement Measurements: KOURTNIE, SACHS ([id number]) [id number].pdf Page 8 of 10 4cm length x 2.5cm width x 0.1cm depth; 0.785cm^3 volume. Character of Wound/Ulcer Post Debridement is improved. Severity of Tissue Post Debridement is: Fat layer exposed. Post procedure Diagnosis Wound #2: Same as Pre-Procedure General Notes: Scribed for Dr. Lady Gary by J.Scotton. Pre-procedure diagnosis of Wound #2 is a Venous Leg Ulcer located on the Left,Posterior Lower Leg . There was a Radio broadcast assistant Compression Therapy Procedure by Karie Schwalbe, RN. Post procedure Diagnosis Wound #2: Same as Pre-Procedure Plan Follow-up Appointments: Return Appointment in 1 week. - Dr. Lady Gary Room 3 Anesthetic: Wound #2 Left,Posterior Lower Leg: (In clinic) Topical Lidocaine 4% applied to wound bed Bathing/ Shower/ Hygiene: May shower with protection but do not get wound dressing(s) wet. Protect dressing(s) with water repellant cover (for example, large plastic bag) or a cast cover and may then take shower. Edema Control - Lymphedema / SCD / Other: Lymphedema Pumps. Use Lymphedema pumps on leg(s) 2-3 times a day for 45-60 minutes. If wearing any wraps or hose, do not remove them. Continue exercising as instructed. - Use x1 per day Avoid standing for long periods of time. Exercise regularly WOUND #2: - Lower Leg Wound Laterality: Left, Posterior Cleanser: Soap and Water 1 x Per Week/30 Days Discharge Instructions: May shower and wash wound with dial antibacterial soap and water prior to dressing change. Peri-Wound Care: Sween Lotion (Moisturizing lotion) 1 x Per Week/30 Days Discharge  Instructions: Apply moisturizing lotion as directed Topical: Keystone 1 x Per Week/30 Days Discharge Instructions: On hold 02/20/23 Prim Dressing: Santyl Ointment 1 x  Per Week/30 Days ary Discharge Instructions: Apply nickel thick amount to wound bed as instructed Secondary Dressing: T Non-adherent Dressing, 2x3 in 1 x Per Week/30 Days elfa Discharge Instructions: Apply over primary dressing as directed. Secondary Dressing: Zetuvit Plus 4x8 in 1 x Per Week/30 Days Discharge Instructions: Apply over primary dressing as directed. Secured With: 36M Medipore Scientist, research (life sciences) Surgical T 2x10 (in/yd) 1 x Per Week/30 Days ape Discharge Instructions: Secure with tape as directed. Com pression Wrap: Unnaboot w/Calamine, 4x10 (in/yd) 1 x Per Week/30 Days Discharge Instructions: Apply Unnaboot as directed. 02/20/2023: The right lateral ankle wound is healed. The left posterior calf wound is smaller and is essentially flush with the surrounding skin, but the surface is quite dry. Her leg wrap slipped and her calf is quite swollen above the level of the wound. I used a curette to debride slough from the left posterior calf wound. I think the [facility] antibiotic compound may be causing the wound to get a little bit too dry. I am going to just use Santyl alone this week. As her Coflex wrap has slid down twice lately, I am going to go back to regular calamine Unna boot for compression on this leg. Follow-up in 1 week. Electronic Signature(s) Signed: 02/20/2023 3:21:17 PM By: Duanne Guess MD FACS Entered By: Duanne Guess on 02/20/2023 15:21:17 -------------------------------------------------------------------------------- HxROS Details Patient Name: Date of Service: ELLEEN, COULIBALY 02/20/2023 2:00 PM Medical Record Number: [id number] Patient Account Number: [id number] Date of Birth/Sex: Treating RN: 1946/09/10 (77 y.o. F) Primary Care Provider: Eustaquio Boyden Other Clinician: Referring  Provider: Treating Provider/Extender: Priscille Loveless in Treatment: 29 Information Obtained From Patient Eyes Medical History: Positive for: Cataracts - L eye OSIRIS, CHARLES ([id number]) [phone].pdf Page 9 of 10 Ear/Nose/Mouth/Throat Medical History: Negative for: Chronic sinus problems/congestion; Middle ear problems Hematologic/Lymphatic Medical History: Negative for: Anemia; Human Immunodeficiency Virus; Lymphedema Respiratory Medical History: Positive for: Asthma Past Medical History Notes: OSA on CPAP Cardiovascular Medical History: Positive for: Hypertension Past Medical History Notes: Cronic venous insufficiency 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 Gastrointestinal Medical History: Past Medical History Notes: liver cyst Endocrine Medical History: Negative for: Type I Diabetes; Type II Diabetes Musculoskeletal Medical History: Past Medical History Notes: arthritis Neurologic Medical History: Positive for: Neuropathy - Feet and finger Negative for: Seizure Disorder Past Medical History Notes: restless leg syndrome Oncologic Medical History: Positive for: Received Chemotherapy - 2012; Received Radiation - 2012 HBO Extended History Items Eyes: Cataracts Immunizations Pneumococcal Vaccine: Received Pneumococcal Vaccination: Yes Received Pneumococcal Vaccination On or After 60th Birthday: Yes Implantable Devices No devices added Hospitalization / Surgery History Type of Hospitalization/Surgery Lower extremity venography 2020 Intravascular ultrasound peripheral vascular intervention melanoma 2011 Family and Social History Cancer: Yes - Mother,Paternal Grandparents; Diabetes: Yes - Siblings; Heart Disease: Yes - Father; Hypertension: Yes - Father; Former smoker - quit in [address], [place] J ([id number]) [id number] Page 10 of 10 Electronic  Signature(s) Signed: 02/20/2023 3:59:24 PM By: Duanne Guess MD FACS Entered By: Duanne Guess on 02/20/2023 15:15:10 -------------------------------------------------------------------------------- SuperBill Details Patient Name: Date of Service: CORTEZ, STEELMAN 02/20/2023 Medical Record Number: [id number] Patient Account Number: [id number] Date of Birth/Sex: Treating RN: 09-24-1946 (77 y.o. F) Primary Care Provider: Eustaquio Boyden Other Clinician: Referring Provider: Treating Provider/Extender: Priscille Loveless in Treatment: 29 Diagnosis Coding ICD-10 Codes Code Description [id number] Non-pressure chronic ulcer of other part of left lower leg with fat layer exposed I83.893 Varicose veins of bilateral lower extremities with other complications I87.2  Venous insufficiency (chronic) (peripheral) G62.9 Polyneuropathy, unspecified R60.0 Localized edema L03.115 Cellulitis of right lower limb Facility Procedures : CPT4 Code: [id number] Description: 97597 - DEBRIDE WOUND 1ST 20 SQ CM OR < ICD-10 Diagnosis Description L97.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 Modifier: d Quantity: 1 Physician Procedures : CPT4 Code Description Modifier [id number] - WC PHYS LEVEL 4 - EST PT 25 ICD-10 Diagnosis Description L97.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d I87.2 Venous insufficiency (chronic) (peripheral) G62.9  Polyneuropathy, unspecified R60.0 Localized edema Quantity: 1 : [id number] - WC PHYS DEBR WO ANESTH 20 SQ CM ICD-10 Diagnosis Description L97.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d Quantity: 1 Electronic Signature(s) Signed: 02/20/2023 3:21:37 PM By: Duanne Guess MD FACS Entered By: Duanne Guess on 02/20/2023 15:21:37

## 2023-02-21 NOTE — Progress Notes (Signed)
Amber, [name] ([id number]) [id number] Page 1 of 8 Visit Report for 02/20/2023 Arrival Information Details Patient Name: Date of Service: [name], [name] 02/20/2023 2:00 PM Medical Record Number: [id number] Patient Account Number: [id number] Date of Birth/Sex: Treating RN: 11/07/1945 (77 y.o. F) Primary Care Kealii Thueson: Eustaquio Boyden Other Clinician: Referring Malaijah Houchen: Treating Montel Vanderhoof/Extender: Priscille Loveless in Treatment: 29 Visit Information History Since Last Visit All ordered tests and consults were completed: No Patient Arrived: Ambulatory Added or deleted any medications: No Arrival Time: 13:47 Any new allergies or adverse reactions: No Accompanied By: self Had a fall or experienced change in No Transfer Assistance: None activities of daily living that may affect Patient Identification Verified: Yes risk of falls: Secondary Verification Process Completed: Yes Signs or symptoms of abuse/neglect since last visito No Patient Requires Transmission-Based Precautions: No Hospitalized since last visit: No Patient Has Alerts: No Implantable device outside of the clinic excluding No cellular tissue based products placed in the center since last visit: Pain Present Now: No Electronic Signature(s) Signed: 02/20/2023 2:51:26 PM By: Dayton Scrape Entered By: Dayton Scrape on 02/20/2023 13:48:19 -------------------------------------------------------------------------------- Compression Therapy Details Patient Name: Date of Service: Amber, [name] 02/20/2023 2:00 PM Medical Record Number: [id number] Patient Account Number: [id number] Date of Birth/Sex: Treating RN: 1946/02/19 (77 y.o. Katrinka Blazing Primary Care Taya Ashbaugh: Eustaquio Boyden Other Clinician: Referring Sereena Marando: Treating Avedis Bevis/Extender: Priscille Loveless in Treatment: 29 Compression Therapy Performed for Wound Assessment: Wound #2  Left,Posterior Lower Leg Performed By: Clinician Karie Schwalbe, RN Compression Type: Henriette Combs Post Procedure Diagnosis Same as Pre-procedure Electronic Signature(s) Signed: 02/20/2023 4:25:55 PM By: Karie Schwalbe RN Entered By: Karie Schwalbe on 02/20/2023 14:30:16 -------------------------------------------------------------------------------- Encounter Discharge Information Details Patient Name: Date of Service: Amber, [name] 02/20/2023 2:00 PM Medical Record Number: [id number] Patient Account Number: [id number] Date of Birth/Sex: Treating RN: [birth year] (77 y.o. Katrinka Blazing Primary Care Twanisha [name]: Eustaquio Boyden Other Clinician: Referring Terrius Gentile: Treating Joeleen Wortley/Extender: Priscille Loveless in Treatment: 29 Encounter Discharge Information Items Post Procedure Vitals Discharge Condition: Stable Temperature (F): 98.3 Ambulatory Status: Cane Pulse (bpm): 83 Discharge Destination: Home Respiratory Rate (breaths/min): 20 Transportation: Private Auto Blood Pressure (mmHg): 158/84 Accompanied By: self [name] ([id number]) [id number] Page 2 of 8 Schedule Follow-up Appointment: Yes Clinical Summary of Care: Patient Declined Electronic Signature(s) Signed: 02/20/2023 4:25:55 PM By: Karie Schwalbe RN Entered By: Karie Schwalbe on 02/20/2023 15:41:41 -------------------------------------------------------------------------------- Lower Extremity Assessment Details Patient Name: Date of Service: Amber, [name] 02/20/2023 2:00 PM Medical Record Number: [id number] Patient Account Number: [id number] Date of Birth/Sex: Treating RN: 09-28-1946 (77 y.o. Katrinka Blazing Primary Care Sharin Altidor: Eustaquio Boyden Other Clinician: Referring Chloe Miyoshi: Treating Osmel Dykstra/Extender: Katharina Caper Weeks in Treatment: 29 Edema Assessment Assessed: [Left: No] [Right: No] Edema: [Left: Yes] [Right:  Yes] Calf Left: Right: Point of Measurement: From Medial Instep 41 cm 44 cm Ankle Left: Right: Point of Measurement: From Medial Instep 23 cm 24 cm Vascular Assessment Pulses: Dorsalis Pedis Palpable: [Left:Yes] [Right:Yes] Electronic Signature(s) Signed: 02/20/2023 4:25:55 PM By: Karie Schwalbe RN Entered By: Karie Schwalbe on 02/20/2023 14:18:23 -------------------------------------------------------------------------------- Multi Wound Chart Details Patient Name: Date of Service: [name]. 02/20/2023 2:00 PM Medical Record Number: [id number] Patient Account Number: [id number] Date of Birth/Sex: Treating RN: [birth year] (77 y.o. F) Primary Care Lamoyne Palencia: Eustaquio Boyden Other Clinician: Referring Billee Balcerzak: Treating Charmayne Odell/Extender: Priscille Loveless in Treatment: 29 Vital Signs Height(in): 63 Pulse(bpm): 83 Weight(lbs): 200 Blood Pressure(mmHg): 158/84 Body Mass Index(BMI): 35.4  Temperature(F): 98.3 Respiratory Rate(breaths/min): 20 [2:Photos: No Photos] [N/A:N/A [id number] Page 3 of 8] Left, Posterior Lower Leg Right, Lateral Ankle N/A Wound Location: Blister Contusion/Bruise N/A Wounding Event: Venous Leg Ulcer Venous Leg Ulcer N/A Primary Etiology: Cataracts, Asthma, Hypertension, Cataracts, Asthma, Hypertension, N/A Comorbid History: Neuropathy, Received Chemotherapy, Neuropathy, Received Chemotherapy, Received Radiation Received Radiation 07/06/2022 10/19/2022 N/A Date Acquired: 5 17 N/A Weeks of Treatment: Open Healed - Epithelialized N/A Wound Status: No No N/A Wound Recurrence: 4x2.5x0.1 0x0x0 N/A Measurements L x W x D (cm) 7.854 0 N/A A (cm) : rea 0.785 0 N/A Volume (cm) : -14.30% 100.00% N/A % Reduction in A rea: -14.30% 100.00% N/A % Reduction in Volume: Full Thickness Without Exposed Full Thickness Without Exposed N/A Classification: Support Structures Support  Structures Medium None Present N/A Exudate A mount: Serosanguineous N/A N/A Exudate Type: red, brown N/A N/A Exudate Color: Distinct, outline attached Flat and Intact N/A Wound Margin: Medium (34-66%) None Present (0%) N/A Granulation A mount: Pink N/A N/A Granulation Quality: Medium (34-66%) None Present (0%) N/A Necrotic A mount: Fat Layer (Subcutaneous Tissue): Yes Fascia: No N/A Exposed Structures: Fascia: No Fat Layer (Subcutaneous Tissue): No Tendon: No Tendon: No Muscle: No Muscle: No Joint: No Joint: No Bone: No Bone: No Medium (34-66%) Large (67-100%) N/A Epithelialization: Debridement - Selective/Open Wound N/A N/A Debridement: Pre-procedure Verification/Time Out 14:25 N/A N/A Taken: Lidocaine 5% topical ointment N/A N/A Pain Control: Slough N/A N/A Tissue Debrided: Non-Viable Tissue N/A N/A Level: 10 N/A N/A Debridement A (sq cm): rea Curette N/A N/A Instrument: Minimum N/A N/A Bleeding: Pressure N/A N/A Hemostasis A chieved: 0 N/A N/A Procedural Pain: 0 N/A N/A Post Procedural Pain: Procedure was tolerated well N/A N/A Debridement Treatment Response: 4x2.5x0.1 N/A N/A Post Debridement Measurements L x W x D (cm) 0.785 N/A N/A Post Debridement Volume: (cm) Scarring: Yes Excoriation: Yes N/A Periwound Skin Texture: No Abnormalities Noted Maceration: Yes N/A Periwound Skin Moisture: Rubor: No Erythema: No N/A Periwound Skin Color: No Abnormality No Abnormality N/A Temperature: Yes Yes N/A Tenderness on Palpation: Compression Therapy N/A N/A Procedures Performed: Debridement Treatment Notes Electronic Signature(s) Signed: 02/20/2023 3:12:57 PM By: Duanne Guess MD FACS Entered By: Duanne Guess on 02/20/2023 15:12:57 -------------------------------------------------------------------------------- Multi-Disciplinary Care Plan Details Patient Name: Date of Service: MARILU, RYLANDER 02/20/2023 2:00 PM Medical Record Number:  [id number] Patient Account Number: [id number] Date of Birth/Sex: Treating RN: 12-30-1945 (77 y.o. Katrinka Blazing Primary Care Thong Feeny: Eustaquio Boyden Other Clinician: Referring [name] Leece: Treating Jonella Redditt/Extender: Priscille Loveless in Treatment: 29 Multidisciplinary Care Plan reviewed with physician [address] BEUNA, BOLDING ([id number]) [id number] Page 4 of 8 Venous Leg Ulcer Nursing Diagnoses: Knowledge deficit related to disease process and management Potential for venous Insuffiency (use before diagnosis confirmed) Goals: Patient will maintain optimal edema control Date Initiated: 02/07/2023 Target Resolution Date: 07/05/2023 Goal Status: Active Interventions: Assess peripheral edema status every visit. Compression as ordered Treatment Activities: Therapeutic compression applied : 02/07/2023 Notes: Wound/Skin Impairment Nursing Diagnoses: Impaired tissue integrity Goals: Ulcer/skin breakdown will have a volume reduction of 30% by week 4 Date Initiated: 08/03/2022 Date Inactivated: 09/10/2022 Target Resolution Date: 08/31/2022 Unmet Reason: uncontrolled edema, Goal Status: Unmet acute infection Ulcer/skin breakdown will have a volume reduction of 50% by week 8 Date Initiated: 09/10/2022 Target Resolution Date: 07/05/2023 Goal Status: Active Interventions: Assess ulceration(s) every visit Treatment Activities: Skin care regimen initiated : 08/03/2022 Notes: Keystone ordered. 10/30 RX Levo started. 09/10/22 Electronic Signature(s) Signed: 02/20/2023 4:25:55 PM By: Karie Schwalbe RN Entered  By: Karie Schwalbe on 02/20/2023 15:40:30 -------------------------------------------------------------------------------- Pain Assessment Details Patient Name: Date of Service: FRED, HAMMES 02/20/2023 2:00 PM Medical Record Number: [id number] Patient Account Number: [id number] Date of Birth/Sex: Treating RN: [birth year] (77  y.o. F) Primary Care Shahzain [name]: Eustaquio Boyden Other Clinician: Referring Michelina Mexicano: Treating Anvitha Hutmacher/Extender: Priscille Loveless in Treatment: 29 Active Problems Location of Pain Severity and Description of Pain Patient Has Paino Yes Site Locations Rate the pain. RYLYNNE, SCHICKER ([id number]) [id number] Page 5 of 8 Rate the pain. Current Pain Level: 4 Worst Pain Level: 10 Least Pain Level: 0 Tolerable Pain Level: 4 Pain Management and Medication Current Pain Management: Electronic Signature(s) Signed: 02/20/2023 2:51:26 PM By: Dayton Scrape Entered By: Dayton Scrape on 02/20/2023 13:49:01 -------------------------------------------------------------------------------- Patient/Caregiver Education Details Patient Name: Date of Service: DANNI, SHIMA 4/17/2024andnbsp2:00 PM Medical Record Number: [id number] Patient Account Number: [id number] Date of Birth/Gender: Treating RN: May 14, 1946 (77 y.o. Katrinka Blazing Primary Care Physician: Eustaquio Boyden Other Clinician: Referring Physician: Treating Physician/Extender: Priscille Loveless in Treatment: 29 Education Assessment Education Provided To: Patient Education Topics Provided Wound/Skin Impairment: Methods: Explain/Verbal Responses: Return demonstration correctly Electronic Signature(s) Signed: 02/20/2023 4:25:55 PM By: Karie Schwalbe RN Entered By: Karie Schwalbe on 02/20/2023 15:40:46 -------------------------------------------------------------------------------- Wound Assessment Details Patient Name: Date of Service: DORETHIA, JEANMARIE 02/20/2023 2:00 PM Medical Record Number: [id number] Patient Account Number: [id number] Date of Birth/Sex: Treating RN: June 17, 1946 (77 y.o. F) Primary Care Analisia Kingsford: Eustaquio Boyden Other Clinician: Referring Ina Scrivens: Treating Saydie Gerdts/Extender: Katharina Caper Weeks in Treatment:  29 Wound Status Wound Number: 2 Primary Venous Leg Ulcer Etiology: Wound Location: Left, Posterior Lower Leg Wound Open Wounding Event: Blister Status: Date Acquired: 07/06/2022 Comorbid Cataracts, Asthma, Hypertension, Neuropathy, Received Weeks Of Treatment: 29 History: Chemotherapy, Received Radiation Clustered Wound: No ZANDRIA, WOLDT ([id number]) [phone].pdf Page 6 of 8 Wound Measurements Length: (cm) 4 Width: (cm) 2.5 Depth: (cm) 0.1 Area: (cm) 7.854 Volume: (cm) 0.785 % Reduction in Area: -14.3% % Reduction in Volume: -14.3% Epithelialization: Medium (34-66%) Tunneling: No Undermining: No Wound Description Classification: Full Thickness Without Exposed Support Structures Wound Margin: Distinct, outline attached Exudate Amount: Medium Exudate Type: Serosanguineous Exudate Color: red, brown Foul Odor After Cleansing: No Slough/Fibrino Yes Wound Bed Granulation Amount: Medium (34-66%) Exposed Structure Granulation Quality: Pink Fascia Exposed: No Necrotic Amount: Medium (34-66%) Fat Layer (Subcutaneous Tissue) Exposed: Yes Necrotic Quality: Adherent Slough Tendon Exposed: No Muscle Exposed: No Joint Exposed: No Bone Exposed: No Periwound Skin Texture Texture Color No Abnormalities Noted: Yes No Abnormalities Noted: Yes Moisture Temperature / Pain No Abnormalities Noted: Yes Temperature: No Abnormality Tenderness on Palpation: Yes Treatment Notes Wound #2 (Lower Leg) Wound Laterality: Left, Posterior Cleanser Soap and Water Discharge Instruction: May shower and wash wound with dial antibacterial soap and water prior to dressing change. Peri-Wound Care Sween Lotion (Moisturizing lotion) Discharge Instruction: Apply moisturizing lotion as directed Topical Keystone Discharge Instruction: On hold 02/20/23 Primary Dressing Santyl Ointment Discharge Instruction: Apply nickel thick amount to wound bed as instructed Secondary  Dressing T Non-adherent Dressing, 2x3 in elfa Discharge Instruction: Apply over primary dressing as directed. Zetuvit Plus 4x8 in Discharge Instruction: Apply over primary dressing as directed. Secured With [employer] T 2x10 (in/yd) ape Discharge Instruction: Secure with tape as directed. Compression Wrap Unnaboot w/Calamine, 4x10 (in/yd) Discharge Instruction: Apply Unnaboot as directed. Compression Stockings Add-Ons Electronic Signature(s) Signed: 02/20/2023 4:25:55 PM By: Karie Schwalbe RN Entered By: Karie Schwalbe on 02/20/2023 14:20:57 [name] ([id number]) [id number]  Page 7 of 8 -------------------------------------------------------------------------------- Wound Assessment Details Patient Name: Date of Service: LORISSA, KISHBAUGH 02/20/2023 2:00 PM Medical Record Number: [id number] Patient Account Number: [id number] Date of Birth/Sex: Treating RN: 03/20/1946 (77 y.o. F) Primary Care Loveda Colaizzi: Eustaquio Boyden Other Clinician: Referring Mingo Siegert: Treating Embree Brawley/Extender: Priscille Loveless in Treatment: 29 Wound Status Wound Number: 3 Primary Venous Leg Ulcer Etiology: Wound Location: Right, Lateral Ankle Wound Healed - Epithelialized Wounding Event: Contusion/Bruise Status: Date Acquired: 10/19/2022 Comorbid Cataracts, Asthma, Hypertension, Neuropathy, Received Weeks Of Treatment: 17 History: Chemotherapy, Received Radiation Clustered Wound: No Photos Wound Measurements Length: (cm) Width: (cm) Depth: (cm) Area: (cm) Volume: (cm) 0 % Reduction in Area: 100% 0 % Reduction in Volume: 100% 0 Epithelialization: Large (67-100%) 0 Tunneling: No 0 Undermining: No Wound Description Classification: Full Thickness Without Exposed Support Structures Wound Margin: Flat and Intact Exudate Amount: None Present Foul Odor After Cleansing: No Slough/Fibrino No Wound Bed Granulation  Amount: None Present (0%) Exposed Structure Necrotic Amount: None Present (0%) Fascia Exposed: No Fat Layer (Subcutaneous Tissue) Exposed: No Tendon Exposed: No Muscle Exposed: No Joint Exposed: No Bone Exposed: No Periwound Skin Texture Texture Color No Abnormalities Noted: Yes No Abnormalities Noted: Yes Moisture Temperature / Pain No Abnormalities Noted: Yes Temperature: No Abnormality Tenderness on Palpation: Yes Electronic Signature(s) Signed: 02/20/2023 4:25:55 PM By: Karie Schwalbe RN Entered By: Karie Schwalbe on 02/20/2023 14:26:05 -------------------------------------------------------------------------------- Vitals Details Patient Name: Date of Service: [name]. 02/20/2023 2:00 PM [name] ([id number]) [id number] Page 8 of 8 Medical Record Number: [id number] Patient Account Number: [id number] Date of Birth/Sex: Treating RN: [birth year] (77 y.o. F) Primary Care Aryaan Persichetti: Eustaquio Boyden Other Clinician: Referring Anetta Olvera: Treating Emillie Chasen/Extender: Priscille Loveless in Treatment: 29 Vital Signs Time Taken: 01:48 Temperature (F): 98.3 Height (in): 63 Pulse (bpm): 83 Weight (lbs): 200 Respiratory Rate (breaths/min): 20 Body Mass Index (BMI): 35.4 Blood Pressure (mmHg): 158/84 Reference Range: 80 - 120 mg / dl Electronic Signature(s) Signed: 02/20/2023 2:51:26 PM By: Dayton Scrape Entered By: Dayton Scrape on 02/20/2023 13:48:42

## 2023-02-21 NOTE — Progress Notes (Signed)
[name], [name] ([id number]) [id number] Page 1 of 4 Visit Report for 02/14/2023 Arrival Information Details Patient Name: Date of Service: [name], [name] 02/14/2023 2:45 PM Medical Record Number: [id number] Patient Account Number: [id number] Date of Birth/Sex: Treating RN: 1946-06-16 (77 y.o. [name] Primary Care Jaydalyn Demattia: Eustaquio Boyden Other Clinician: Referring Izamar Linden: Treating Bradleigh Sonnen/Extender: Priscille Loveless in Treatment: 28 Visit Information History Since Last Visit All ordered tests and consults were completed: Yes Patient Arrived: Ambulatory Added or deleted any medications: No Arrival Time: 15:30 Any new allergies or adverse reactions: No Accompanied By: self Had a fall or experienced change in No Transfer Assistance: None activities of daily living that may affect Patient Identification Verified: Yes risk of falls: Secondary Verification Process Completed: Yes Signs or symptoms of abuse/neglect since last visito No Patient Requires Transmission-Based Precautions: No Hospitalized since last visit: No Patient Has Alerts: No Implantable device outside of the clinic excluding No cellular tissue based products placed in the center since last visit: Pain Present Now: No Electronic Signature(s) Signed: 02/21/2023 1:56:54 PM By: Brenton Grills Entered By: Brenton Grills on 02/14/2023 15:53:17 -------------------------------------------------------------------------------- Compression Therapy Details Patient Name: Date of Service: [name], [name] 02/14/2023 2:45 PM Medical Record Number: [id number] Patient Account Number: [id number] Date of Birth/Sex: Treating RN: Mar 18, 1946 (77 y.o. [name] Primary Care Montoya Brandel: Eustaquio Boyden Other Clinician: Referring Francia Verry: Treating Cobi Delph/Extender: Priscille Loveless in Treatment: 28 Compression Therapy Performed for Wound  Assessment: Wound #2 Left,Posterior Lower Leg Performed By: Clinician Samuella Bruin, RN Compression Type: Double Layer Electronic Signature(s) Signed: 02/14/2023 3:58:28 PM By: Samuella Bruin Entered By: Samuella Bruin on 02/14/2023 15:55:45 -------------------------------------------------------------------------------- Compression Therapy Details Patient Name: Date of Service: [name], [name] 02/14/2023 2:45 PM Medical Record Number: [id number] Patient Account Number: [id number] Date of Birth/Sex: Treating RN: 1946/04/04 (77 y.o. [name] Primary Care Anali Cabanilla: Eustaquio Boyden Other Clinician: Referring Piero Mustard: Treating Rosellen Lichtenberger/Extender: Katharina Caper Weeks in Treatment: 28 Compression Therapy Performed for Wound Assessment: Wound #3 Right,Lateral Ankle Performed By: Clinician Samuella Bruin, RN Compression Type: Double Layer Electronic Signature(s) Signed: 02/14/2023 3:58:28 PM By: Samuella Bruin Entered By: Samuella Bruin on 02/14/2023 15:55:45 Petrik, [name] ([id number]) [id number] Page 2 of 4 -------------------------------------------------------------------------------- Encounter Discharge Information Details Patient Name: Date of Service: [name], [name] 02/14/2023 2:45 PM Medical Record Number: [id number] Patient Account Number: [id number] Date of Birth/Sex: Treating RN: 1946-06-18 (77 y.o. [name] Primary Care Aria Pickrell: Eustaquio Boyden Other Clinician: Referring Millisa Giarrusso: Treating Joel Mericle/Extender: Priscille Loveless in Treatment: 28 Encounter Discharge Information Items Discharge Condition: Stable Ambulatory Status: Ambulatory Discharge Destination: Home Transportation: Private Auto Accompanied By: self Schedule Follow-up Appointment: Yes Clinical Summary of Care: Patient Declined Electronic Signature(s) Signed: 02/14/2023 3:58:28 PM By:  Samuella Bruin Entered By: Samuella Bruin on 02/14/2023 15:56:12 -------------------------------------------------------------------------------- Patient/Caregiver Education Details Patient Name: Date of Service: [name] 4/11/2024andnbsp2:45 PM Medical Record Number: [id number] Patient Account Number: [id number] Date of Birth/Gender: Treating RN: [birth year] (77 y.o. [name] Primary Care Physician: Eustaquio Boyden Other Clinician: Referring Physician: Treating Physician/Extender: Priscille Loveless in Treatment: 28 Education Assessment Education Provided To: Patient Education Topics Provided Wound/Skin Impairment: Methods: Explain/Verbal Responses: Reinforcements needed, State content correctly Electronic Signature(s) Signed: 02/14/2023 3:58:28 PM By: Samuella Bruin Entered By: Samuella Bruin on 02/14/2023 15:56:01 -------------------------------------------------------------------------------- Wound Assessment Details Patient Name: Date of Service: [name], [name] 02/14/2023 2:45 PM Medical Record Number: [id number] Patient Account Number: [id number] Date of Birth/Sex: Treating RN: [birth year] (76 y.o.  [name] Primary Care Yong Wahlquist: Eustaquio Boyden Other Clinician: Referring Aylen Stradford: Treating Kathy Wahid/Extender: Katharina Caper Weeks in Treatment: 28 Wound Status Wound Number: 2 Primary Venous Leg Ulcer Etiology: Wound Location: Left, Posterior Lower Leg Wound Open Wounding Event: Blister Status: Date Acquired: 07/06/2022 Comorbid Cataracts, Asthma, Hypertension, Neuropathy, Received Weeks Of Treatment: 28 History: Chemotherapy, Received Radiation Clustered Wound: No Wound Measurements [name], [name] ([id number]) Length: (cm) 5 Width: (cm) 2.7 Depth: (cm) 0.1 Area: (cm) 10.603 Volume: (cm) 1.06 [id number] Page 3 of 4 % Reduction in Area: -54.3% %  Reduction in Volume: -54.3% Epithelialization: Medium (34-66%) Tunneling: No Undermining: No Wound Description Classification: Full Thickness Without Exposed Suppor Wound Margin: Distinct, outline attached Exudate Amount: Medium Exudate Type: Serosanguineous Exudate Color: red, brown t Structures Foul Odor After Cleansing: No Slough/Fibrino Yes Wound Bed Granulation Amount: Medium (34-66%) Exposed Structure Granulation Quality: Pink Fascia Exposed: No Necrotic Amount: Medium (34-66%) Fat Layer (Subcutaneous Tissue) Exposed: Yes Necrotic Quality: Adherent Slough Tendon Exposed: No Muscle Exposed: No Joint Exposed: No Bone Exposed: No Periwound Skin Texture Texture Color No Abnormalities Noted: Yes No Abnormalities Noted: Yes Moisture Temperature / Pain No Abnormalities Noted: Yes Temperature: No Abnormality Tenderness on Palpation: Yes Electronic Signature(s) Signed: 02/21/2023 1:56:54 PM By: Brenton Grills Entered By: Brenton Grills on 02/14/2023 15:54:37 -------------------------------------------------------------------------------- Wound Assessment Details Patient Name: Date of Service: [name], [name] 02/14/2023 2:45 PM Medical Record Number: [id number] Patient Account Number: [id number] Date of Birth/Sex: Treating RN: 1946-07-26 (77 y.o. [name] Primary Care Corvin Sorbo: Eustaquio Boyden Other Clinician: Referring Kandee Escalante: Treating Brendt Dible/Extender: Katharina Caper Weeks in Treatment: 28 Wound Status Wound Number: 3 Primary Venous Leg Ulcer Etiology: Wound Location: Right, Lateral Ankle Wound Open Wounding Event: Contusion/Bruise Status: Date Acquired: 10/19/2022 Comorbid Cataracts, Asthma, Hypertension, Neuropathy, Received Weeks Of Treatment: 16 History: Chemotherapy, Received Radiation Clustered Wound: No Wound Measurements Length: (cm) 0.1 Width: (cm) 0.1 Depth: (cm) 0.1 Area: (cm) 0.008 Volume: (cm) 0.001 % Reduction  in Area: 99.7% % Reduction in Volume: 99.6% Epithelialization: Large (67-100%) Tunneling: No Undermining: No Wound Description Classification: Full Thickness Without Exposed Support Structures Wound Margin: Flat and Intact Exudate Amount: None Present Foul Odor After Cleansing: No Slough/Fibrino Yes Wound Bed Granulation Amount: None Present (0%) Exposed Structure Necrotic Amount: Large (67-100%) Fascia Exposed: No Necrotic Quality: Eschar Fat Layer (Subcutaneous Tissue) Exposed: Yes Tendon Exposed: No Muscle Exposed: No Joint Exposed: No [name], [name] ([id number]) [id number] Page 4 of 4 Bone Exposed: No Periwound Skin Texture Texture Color No Abnormalities Noted: Yes No Abnormalities Noted: Yes Moisture Temperature / Pain No Abnormalities Noted: Yes Temperature: No Abnormality Tenderness on Palpation: Yes Electronic Signature(s) Signed: 02/21/2023 1:56:54 PM By: Brenton Grills Entered By: Brenton Grills on 02/14/2023 15:54:50 -------------------------------------------------------------------------------- Vitals Details Patient Name: Date of Service: [name] 02/14/2023 2:45 PM Medical Record Number: [id number] Patient Account Number: [id number] Date of Birth/Sex: Treating RN: 1946/04/05 (77 y.o. [name] Primary Care Amra Shukla: Eustaquio Boyden Other Clinician: Referring Jameia Makris: Treating Roslin [name]/Extender: Katharina Caper Weeks in Treatment: 28 Vital Signs Time Taken: 15:30 Reference Range: 80 - 120 mg / dl Height (in): 63 Weight (lbs): 200 Body Mass Index (BMI): 35.4 Electronic Signature(s) Signed: 02/21/2023 1:56:54 PM By: Brenton Grills Entered By: Brenton Grills on 02/14/2023 15:53:38

## 2023-02-26 ENCOUNTER — Encounter: Payer: Self-pay | Admitting: Physician Assistant

## 2023-03-01 ENCOUNTER — Encounter (HOSPITAL_BASED_OUTPATIENT_CLINIC_OR_DEPARTMENT_OTHER): Payer: Medicare Other | Admitting: General Surgery

## 2023-03-01 DIAGNOSIS — I10 Essential (primary) hypertension: Secondary | ICD-10-CM | POA: Diagnosis not present

## 2023-03-01 DIAGNOSIS — S91001A Unspecified open wound, right ankle, initial encounter: Secondary | ICD-10-CM | POA: Diagnosis not present

## 2023-03-01 DIAGNOSIS — L97818 Non-pressure chronic ulcer of other part of right lower leg with other specified severity: Secondary | ICD-10-CM | POA: Diagnosis not present

## 2023-03-01 DIAGNOSIS — L03115 Cellulitis of right lower limb: Secondary | ICD-10-CM | POA: Diagnosis not present

## 2023-03-01 DIAGNOSIS — G629 Polyneuropathy, unspecified: Secondary | ICD-10-CM | POA: Diagnosis not present

## 2023-03-01 DIAGNOSIS — L97222 Non-pressure chronic ulcer of left calf with fat layer exposed: Secondary | ICD-10-CM | POA: Diagnosis not present

## 2023-03-01 DIAGNOSIS — G4733 Obstructive sleep apnea (adult) (pediatric): Secondary | ICD-10-CM | POA: Diagnosis not present

## 2023-03-01 DIAGNOSIS — S81801A Unspecified open wound, right lower leg, initial encounter: Secondary | ICD-10-CM | POA: Diagnosis not present

## 2023-03-01 DIAGNOSIS — L97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d: Secondary | ICD-10-CM | POA: Diagnosis not present

## 2023-03-01 DIAGNOSIS — I872 Venous insufficiency (chronic) (peripheral): Secondary | ICD-10-CM | POA: Diagnosis not present

## 2023-03-02 NOTE — Progress Notes (Signed)
[name], MCKENNY ([id number]) [id number] Page 1 of 12 Visit Report for 03/01/2023 Chief Complaint Document Details Patient Name: Date of Service: JACKQULYN, [name] 03/01/2023 1:15 PM Medical Record Number: [id number] Patient Account Number: [id number] Date of Birth/Sex: Treating RN: Nov 15, 1945 (77 y.o. F) Primary Care Provider: Eustaquio Boyden Other Clinician: Referring Provider: Treating Provider/Extender: Priscille Loveless in Treatment: 31 Information Obtained from: Patient Chief Complaint Patient presents for treatment of an open ulcer due to venous insufficiency Electronic Signature(s) Signed: 03/01/2023 2:26:44 PM By: Duanne Guess MD FACS Entered By: Duanne Guess on 03/01/2023 14:26:44 -------------------------------------------------------------------------------- Debridement Details Patient Name: Date of Service: [name], [name] 03/01/2023 1:15 PM Medical Record Number: [id number] Patient Account Number: [id number] Date of Birth/Sex: Treating RN: 12/10/1945 (77 y.o. Woodroe Mode, Randa Evens Primary Care Provider: Eustaquio Boyden Other Clinician: Referring Provider: Treating Provider/Extender: Priscille Loveless in Treatment: 31 Debridement Performed for Assessment: Wound #2 Left,Posterior Lower Leg Performed By: Physician Duanne Guess, MD Debridement Type: Debridement Severity of Tissue Pre Debridement: Fat layer exposed Level of Consciousness (Pre-procedure): Awake and Alert Pre-procedure Verification/Time Out Yes - 13:57 Taken: Start Time: 13:57 Pain Control: Lidocaine 4% T opical Solution Percent of Wound Bed Debrided: 100% T Area Debrided (cm): otal 7.06 Tissue and other material debrided: Non-Viable, Slough, Slough Level: Non-Viable Tissue Debridement Description: Selective/Open Wound Instrument: Curette Bleeding: Minimum Hemostasis Achieved: Pressure End Time: 13:58 Procedural Pain:  0 Post Procedural Pain: 0 Response to Treatment: Procedure was tolerated well Level of Consciousness (Post- Awake and Alert procedure): Post Debridement Measurements of Total Wound Length: (cm) 3.6 Width: (cm) 2.5 Depth: (cm) 0.1 Volume: (cm) 0.707 Character of Wound/Ulcer Post Debridement: Improved Severity of Tissue Post Debridement: Fat layer exposed Post Procedure Diagnosis Same as Pre-procedure Notes Scribed for Dr. Lady Gary by J.Scotton Roberts, [place] J ([id number]) [phone] Page 2 of 12 Electronic Signature(s) Signed: 03/01/2023 4:37:07 PM By: Karie Schwalbe RN Signed: 03/01/2023 4:46:59 PM By: Duanne Guess MD FACS Entered By: Karie Schwalbe on 03/01/2023 14:01:58 -------------------------------------------------------------------------------- HPI Details Patient Name: Date of Service: [name], BUCHTA 03/01/2023 1:15 PM Medical Record Number: [id number] Patient Account Number: [id number] Date of Birth/Sex: Treating RN: 24-Feb-1946 (77 y.o. F) Primary Care Provider: Eustaquio Boyden Other Clinician: Referring Provider: Treating Provider/Extender: Priscille Loveless in Treatment: 31 History of Present Illness HPI Description: ADMISSION This is a 77 year old woman with a history of chronic venous insufficiency status post saphenous vein ablations in 2010 and 2016. 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 She presents today with an open venous ulcer on her left lower leg. It has been present for a little over 2 weeks. She saw her primary care provider who apparently swabbed the wound and grew out Pseudomonas. She just completed a course of ciprofloxacin for this. ABI was 0.91. She reports that she has had previous issues with ulcers in this same location, stemming back to a punch biopsy taken by a dermatologist many years ago. She has had several skin substitutes applied to the area that have  ultimately resulted in healing on prior occasions. She has 2 small ulcers on her left medial lower leg. There is slough accumulation in both of them. The more anterior of the 2 is quite small and has some soft slough on the surface, underneath which there is good granulation tissue. The more medial wound also has slough accumulation, but the underlying surface is fairly fibrotic and gritty. This is consistent with her provided history of multiple ulcers in the  same location. 08/03/2022: The anterior wound is smaller today with just a little bit of slough accumulation. The more medial wound continues to be very sensitive and fibrotic with slough buildup. 08/13/2022: The anterior wound is closed. The more medial wound remains sensitive with a fairly fibrotic surface. There is more granulation tissue filling in, however, and there is less slough than on prior occasions. 08/20/2022: The anterior wound remains closed. The more medial wound has filled with granulation tissue but still has a fair amount of slough on the surface and remains fairly tender. Unfortunately, she has developed 2 small ulcers just proximal to this. The fat layer is exposed in each. She says that over the weekend, she felt a burning sensation in that location. 08/27/2022: The 2 small wounds proximal to the main wound have merged into a single site. The wounds look a little bit dry, but they are quite clean without significant slough accumulation. They remain quite tender. 09/03/2022: All of the wounds have now merged into 1 large wound. There is a strong odor coming from the wound and the surface is not particularly viable. It is extremely painful for her today. 09/10/2022: The wound is less black and purple this week but still does not look particularly viable. The surface is desiccated. The culture that I took last week returned with a polymicrobial population including Pseudomonas. I prescribed both Augmentin and levofloxacin. She  has not yet picked up levofloxacin, as her pharmacy only notified her yesterday that it was ready. We have ordered a Keystone topical compounded antibiotic, but it has not yet come. 09/17/2022: The wound surface is markedly improved today. There is still an area of grayish-looking muscle but the rest appears significantly more viable. There is still a layer of slough on the surface. She still has a couple days left of oral antibiotic therapy. She has her Jodie Echevaria compounded topical antibiotic with her today. 09/24/2022: She has completed her oral antibiotic therapy. The wound surface is much cleaner today and more viable without any necrotic tissue. It is a bit desiccated, however. 10/01/2022: The moisture balance of the wound has improved. There is a layer of yellow slough on the surface, but beneath this, the wound is more pink. 10/08/2022: The wound is smaller and cleaner today with a layer of slough present. She is having less pain. 12/18; this patient has a new wound on the right lateral ankle which apparently started after a cat scratched her. Secondarily she managed to drop a cake pan on the same area. This is very painful. The original wound was on the left posterior calf we have been using [employer] under an [employer]. The Unna boot fell down and apparently she was in for a nurse visit perhaps last week and that 1 fell down as well This patient has central venous mediated hypertension. For member correctly she has a stent placed in the left common iliac artery by Dr. Randie Heinz some years ago [facility] Thurner syndrome] she also has had venous ablations and I believe a history of a DVT The patient has compression pumps at home but she does not use them 10/30/2022: The patient says that her wounds burned all week with the [facility] classic in place. She has a fair amount of slough and nonviable subcutaneous tissue present on the right ankle. The left posterior calf wound is cleaner than I  have seen it to date. Both remain fairly tender. 11/14/2022: Both wounds have substantial slough and eschar accumulation. They remain extremely  tender. 11/21/2022: The wounds are cleaner this week, but still have slough and eschar accumulation. She continues to describe burning pain in both wounds, but upon further questioning, she actually has burning in both of her feet that radiates up her legs and includes to the wounds. 11/28/2022: Both wounds have slough and eschar accumulation, as well as adherent silver alginate that was difficult to remove. She continues to have pain out of proportion to the extent of her wounds. Her PCP did initiate Lyrica which she started taking last night. The dose is quite low, only 25 mg, but apparently there is a plan for upward titration, assuming she tolerates the medication. 12/05/2022: The wounds are both a little bit smaller today, but have significant slough accumulation, as usual. They remain exquisitely tender. She is now taking Lyrica twice a day. CLOVER, FEEHAN ([id number]) [id number] Page 3 of 12 12/19/2022: Both of her wounds look significantly better this week. There is slough buildup on both, but they are smaller. She seems to be getting a good response from Lyrica as she is having much less pain. 12/26/2022: The wound on her right lateral ankle is nearly closed. The wound on her left posterior calf is smaller but still has a lot of slough accumulation. They remain tender. 01/02/2023: The right lateral ankle wound is down to just a couple of millimeters. It is much less tender than on prior occasions. There is minimal slough buildup. The wound on her posterior calf continues to contract, as well. It has thick slough buildup and remains fairly tender. 01/09/2023: The right lateral ankle wound is nearly closed. He remains tender. There is just a little eschar on the surface. The wound on her posterior calf has improved quite a bit.  There is still slough on the surface, but the more proximal area has epithelialized substantially and there is no longer any gray discoloration to her tissue. 3/13; patient presents for follow-up. Her right lateral ankle wound is almost healed. She has a wound to her left posterior calf with slough and granulation tissue. We have been using Santyl under Unna boots bilaterally to the lower extremities. She has no issues or complaints today. 01/30/2023: The right lateral ankle wound is down to just a pinhole under a layer of eschar. The left posterior calf wound is quite a bit improved since the last time I saw it. There is still a layer of slough on the surface but there has been more epithelialization. 02/07/2023: The right lateral ankle wound still has some eschar on the surface. The left posterior calf wound is about the same size, but with more areas of epithelialization. 02/20/2023: The right lateral ankle wound is healed. The left posterior calf wound is smaller and is essentially flush with the surrounding skin, but the surface is quite dry. Her leg wrap slipped and her calf is quite swollen above the level of the wound. 03/01/2023: The right lateral ankle wound reopened and she also suffered a fall that resulted in a small wound on her right anterior tibial surface. The left posterior calf wound has a much better moisture balance today. There is a layer of slough on the surface. The [employer] worked much better for her as far as compression this week. Electronic Signature(s) Signed: 03/01/2023 2:27:44 PM By: Duanne Guess MD FACS Entered By: Duanne Guess on 03/01/2023 14:27:44 -------------------------------------------------------------------------------- Physical Exam Details Patient Name: Date of Service: [name], [name] 03/01/2023 1:15 PM Medical Record Number: [id number] Patient Account Number: [id number] Date of  Birth/Sex: Treating RN: 1946/11/05 (77 y.o. F) Primary Care Provider:  Eustaquio Boyden Other Clinician: Referring Provider: Treating Provider/Extender: Priscille Loveless in Treatment: 31 Constitutional Hypertensive, asymptomatic. . . . no acute distress. Respiratory Normal work of breathing on room air. Notes 03/01/2023: The right lateral ankle wound reopened and she also suffered a fall that resulted in a small wound on her right anterior tibial surface. The left posterior calf wound has a much better moisture balance today. There is a layer of slough on the surface. The [employer] worked much better for her as far as compression this week. Electronic Signature(s) Signed: 03/01/2023 2:28:31 PM By: Duanne Guess MD FACS Entered By: Duanne Guess on 03/01/2023 14:28:31 -------------------------------------------------------------------------------- Physician Orders Details Patient Name: Date of Service: MERTHA, CLYATT 03/01/2023 1:15 PM Medical Record Number: [id number] Patient Account Number: [id number] Date of Birth/Sex: Treating RN: Apr 28, 1946 (77 y.o. Katrinka Blazing Primary Care Provider: Eustaquio Boyden Other Clinician: Referring Provider: Treating Provider/Extender: Priscille Loveless in Treatment: 58 Verbal / Phone Orders: No Diagnosis Coding ICD-10 Coding Code Description SHAKAYLA, HICKOX ([id number]) [id number] Page 4 of 12 [phone] Non-pressure chronic ulcer of other part of left lower leg with fat layer exposed L97.818 Non-pressure chronic ulcer of other part of right lower leg with other specified severity I83.893 Varicose veins of bilateral lower extremities with other complications I87.2 Venous insufficiency (chronic) (peripheral) G62.9 Polyneuropathy, unspecified R60.0 Localized edema L03.115 Cellulitis of right lower limb Follow-up Appointments ppointment in 1 week. - Dr. Lady Gary Room 3 Return A Anesthetic Wound #2 Left,Posterior Lower Leg (In  clinic) Topical Lidocaine 4% applied to wound bed Bathing/ Shower/ Hygiene May shower with protection but do not get wound dressing(s) wet. Protect dressing(s) with water repellant cover (for example, large plastic bag) or a cast cover and may then take shower. Edema Control - Lymphedema / SCD / Other Left Lower Extremity Lymphedema Pumps. Use Lymphedema pumps on leg(s) 2-3 times a day for 45-60 minutes. If wearing any wraps or hose, do not remove them. Continue exercising as instructed. - Use x1 per day Avoid standing for long periods of time. Exercise regularly Off-Loading Wound #2 Left,Posterior Lower Leg Other: - Elevate legs at heart level or above heart level while sitting. Wound #4 Right,Anterior Lower Leg Other: - Elevate legs at heart level or above heart level while sitting. Wound #5 Right,Lateral Ankle Other: - Elevate legs at heart level or above heart level while sitting. Wound Treatment Wound #2 - Lower Leg Wound Laterality: Left, Posterior Cleanser: Soap and Water 1 x Per Week/30 Days Discharge Instructions: May shower and wash wound with dial antibacterial soap and water prior to dressing change. Peri-Wound Care: Sween Lotion (Moisturizing lotion) 1 x Per Week/30 Days Discharge Instructions: Apply moisturizing lotion as directed Topical: Keystone 1 x Per Week/30 Days Discharge Instructions: On hold 03/01/23 Prim Dressing: Santyl Ointment 1 x Per Week/30 Days ary Discharge Instructions: Apply nickel thick amount to wound bed as instructed Secondary Dressing: T Non-adherent Dressing, 2x3 in 1 x Per Week/30 Days elfa Discharge Instructions: Apply over primary dressing as directed. Secondary Dressing: Zetuvit Plus 4x8 in 1 x Per Week/30 Days Discharge Instructions: Apply over primary dressing as directed. Secured With: 40M Medipore Scientist, research (life sciences) Surgical T 2x10 (in/yd) 1 x Per Week/30 Days ape Discharge Instructions: Secure with tape as directed. Compression Wrap: Unnaboot  w/Calamine, 4x10 (in/yd) 1 x Per Week/30 Days Discharge Instructions: Apply Unnaboot as directed. Wound #4 - Lower Leg Wound Laterality: Right,  Anterior Cleanser: Vashe 5.8 (oz) 1 x Per Week/30 Days Discharge Instructions: Cleanse the wound with Vashe prior to applying a clean dressing using gauze sponges, not tissue or cotton balls. Cleanser: Wound Cleanser 1 x Per Week/30 Days Discharge Instructions: Cleanse the wound with wound cleanser prior to applying a clean dressing using gauze sponges, not tissue or cotton balls. Prim Dressing: Maxorb Extra Calcium Alginate, 2x2 (in/in) 1 x Per Week/30 Days ary Discharge Instructions: Apply to wound bed as instructed Secondary Dressing: Woven Gauze Sponge, Non-Sterile 4x4 in 1 x Per Week/30 Days Discharge Instructions: Apply over primary dressing as directed. Compression Wrap: Unnaboot w/Calamine, 4x10 (in/yd) 1 x Per Week/30 Days KRISHIKA, BUGGE ([id number]) [id number].pdf Page 5 of 12 Discharge Instructions: Apply Unnaboot as directed. Compression Wrap: Stockinette 1 x Per Week/30 Days Wound #5 - Ankle Wound Laterality: Right, Lateral Cleanser: Vashe 5.8 (oz) 1 x Per Week/30 Days Discharge Instructions: Cleanse the wound with Vashe prior to applying a clean dressing using gauze sponges, not tissue or cotton balls. Cleanser: Wound Cleanser 1 x Per Week/30 Days Discharge Instructions: Cleanse the wound with wound cleanser prior to applying a clean dressing using gauze sponges, not tissue or cotton balls. Prim Dressing: Maxorb Extra Calcium Alginate, 2x2 (in/in) 1 x Per Week/30 Days ary Discharge Instructions: Apply to wound bed as instructed Secondary Dressing: Woven Gauze Sponge, Non-Sterile 4x4 in 1 x Per Week/30 Days Discharge Instructions: Apply over primary dressing as directed. Compression Wrap: Unnaboot w/Calamine, 4x10 (in/yd) 1 x Per Week/30 Days Discharge Instructions: Apply Unnaboot as directed. Compression  Wrap: Stockinette 1 x Per Week/30 Days Electronic Signature(s) Signed: 03/01/2023 4:37:07 PM By: Karie Schwalbe RN Signed: 03/01/2023 4:46:59 PM By: Duanne Guess MD FACS Entered By: Karie Schwalbe on 03/01/2023 15:49:10 -------------------------------------------------------------------------------- Problem List Details Patient Name: Date of Service: TAKERIA, MARQUINA 03/01/2023 1:15 PM Medical Record Number: [id number] Patient Account Number: [id number] Date of Birth/Sex: Treating RN: Dec 09, 1945 (77 y.o. F) Primary Care Provider: Eustaquio Boyden Other Clinician: Referring Provider: Treating Provider/Extender: Priscille Loveless in Treatment: 31 Active Problems ICD-10 Encounter Code Description Active Date MDM Diagnosis [phone] Non-pressure chronic ulcer of other part of left lower leg with fat layer exposed9/22/2023 No Yes L97.818 Non-pressure chronic ulcer of other part of right lower leg with other specified 03/01/2023 No Yes severity I83.893 Varicose veins of bilateral lower extremities with other complications 07/27/2022 No Yes I87.2 Venous insufficiency (chronic) (peripheral) 07/27/2022 No Yes G62.9 Polyneuropathy, unspecified 07/27/2022 No Yes R60.0 Localized edema 07/27/2022 No Yes L03.115 Cellulitis of right lower limb 10/22/2022 No Yes QUINN, BARTLING ([id number]) [phone].pdf Page 6 of 12 Inactive Problems Resolved Problems Electronic Signature(s) Signed: 03/01/2023 2:25:52 PM By: Duanne Guess MD FACS Entered By: Duanne Guess on 03/01/2023 14:25:52 -------------------------------------------------------------------------------- Progress Note Details Patient Name: Date of Service: LYNDSI, ALTIC 03/01/2023 1:15 PM Medical Record Number: [id number] Patient Account Number: [id number] Date of Birth/Sex: Treating RN: September 22, 1946 (77 y.o. F) Primary Care Provider: Eustaquio Boyden Other Clinician: Referring  Provider: Treating Provider/Extender: Priscille Loveless in Treatment: 31 Subjective Chief Complaint Information obtained from Patient Patient presents for treatment of an open ulcer due to venous insufficiency History of Present Illness (HPI) ADMISSION This is a 77 year old woman with a history of chronic venous insufficiency status post saphenous vein ablations in 2010 and 2016. 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 She presents today with an open venous ulcer on her left lower leg. It has been present for a little over  2 weeks. She saw her primary care provider who apparently swabbed the wound and grew out Pseudomonas. She just completed a course of ciprofloxacin for this. ABI was 0.91. She reports that she has had previous issues with ulcers in this same location, stemming back to a punch biopsy taken by a dermatologist many years ago. She has had several skin substitutes applied to the area that have ultimately resulted in healing on prior occasions. She has 2 small ulcers on her left medial lower leg. There is slough accumulation in both of them. The more anterior of the 2 is quite small and has some soft slough on the surface, underneath which there is good granulation tissue. The more medial wound also has slough accumulation, but the underlying surface is fairly fibrotic and gritty. This is consistent with her provided history of multiple ulcers in the same location. 08/03/2022: The anterior wound is smaller today with just a little bit of slough accumulation. The more medial wound continues to be very sensitive and fibrotic with slough buildup. 08/13/2022: The anterior wound is closed. The more medial wound remains sensitive with a fairly fibrotic surface. There is more granulation tissue filling in, however, and there is less slough than on prior occasions. 08/20/2022: The anterior wound remains closed. The more medial wound has  filled with granulation tissue but still has a fair amount of slough on the surface and remains fairly tender. Unfortunately, she has developed 2 small ulcers just proximal to this. The fat layer is exposed in each. She says that over the weekend, she felt a burning sensation in that location. 08/27/2022: The 2 small wounds proximal to the main wound have merged into a single site. The wounds look a little bit dry, but they are quite clean without significant slough accumulation. They remain quite tender. 09/03/2022: All of the wounds have now merged into 1 large wound. There is a strong odor coming from the wound and the surface is not particularly viable. It is extremely painful for her today. 09/10/2022: The wound is less black and purple this week but still does not look particularly viable. The surface is desiccated. The culture that I took last week returned with a polymicrobial population including Pseudomonas. I prescribed both Augmentin and levofloxacin. She has not yet picked up levofloxacin, as her pharmacy only notified her yesterday that it was ready. We have ordered a Keystone topical compounded antibiotic, but it has not yet come. 09/17/2022: The wound surface is markedly improved today. There is still an area of grayish-looking muscle but the rest appears significantly more viable. There is still a layer of slough on the surface. She still has a couple days left of oral antibiotic therapy. She has her Jodie Echevaria compounded topical antibiotic with her today. 09/24/2022: She has completed her oral antibiotic therapy. The wound surface is much cleaner today and more viable without any necrotic tissue. It is a bit desiccated, however. 10/01/2022: The moisture balance of the wound has improved. There is a layer of yellow slough on the surface, but beneath this, the wound is more pink. 10/08/2022: The wound is smaller and cleaner today with a layer of slough present. She is having less  pain. 12/18; this patient has a new wound on the right lateral ankle which apparently started after a cat scratched her. Secondarily she managed to drop a cake pan on the same area. This is very painful. The original wound was on the left posterior calf we have been using [facility] under  an [employer]. The Unna boot fell down and apparently she was in for a nurse visit perhaps last week and that 1 fell down as well This patient has central venous mediated hypertension. For member correctly she has a stent placed in the left common iliac artery by Dr. Randie Heinz some years ago [facility] Thurner syndrome] she also has had venous ablations and I believe a history of a DVT The patient has compression pumps at home but she does not use them 10/30/2022: The patient says that her wounds burned all week with the [facility] classic in place. She has a fair amount of slough and nonviable subcutaneous tissue present on the right ankle. The left posterior calf wound is cleaner than I have seen it to date. Both remain fairly tender. [name], CAPASSO ([id number]) [id number] Page 7 of 12 11/14/2022: Both wounds have substantial slough and eschar accumulation. They remain extremely tender. 11/21/2022: The wounds are cleaner this week, but still have slough and eschar accumulation. She continues to describe burning pain in both wounds, but upon further questioning, she actually has burning in both of her feet that radiates up her legs and includes to the wounds. 11/28/2022: Both wounds have slough and eschar accumulation, as well as adherent silver alginate that was difficult to remove. She continues to have pain out of proportion to the extent of her wounds. Her PCP did initiate Lyrica which she started taking last night. The dose is quite low, only 25 mg, but apparently there is a plan for upward titration, assuming she tolerates the medication. 12/05/2022: The wounds are both a little bit  smaller today, but have significant slough accumulation, as usual. They remain exquisitely tender. She is now taking Lyrica twice a day. 12/19/2022: Both of her wounds look significantly better this week. There is slough buildup on both, but they are smaller. She seems to be getting a good response from Lyrica as she is having much less pain. 12/26/2022: The wound on her right lateral ankle is nearly closed. The wound on her left posterior calf is smaller but still has a lot of slough accumulation. They remain tender. 01/02/2023: The right lateral ankle wound is down to just a couple of millimeters. It is much less tender than on prior occasions. There is minimal slough buildup. The wound on her posterior calf continues to contract, as well. It has thick slough buildup and remains fairly tender. 01/09/2023: The right lateral ankle wound is nearly closed. He remains tender. There is just a little eschar on the surface. The wound on her posterior calf has improved quite a bit. There is still slough on the surface, but the more proximal area has epithelialized substantially and there is no longer any gray discoloration to her tissue. 3/13; patient presents for follow-up. Her right lateral ankle wound is almost healed. She has a wound to her left posterior calf with slough and granulation tissue. We have been using Santyl under Unna boots bilaterally to the lower extremities. She has no issues or complaints today. 01/30/2023: The right lateral ankle wound is down to just a pinhole under a layer of eschar. The left posterior calf wound is quite a bit improved since the last time I saw it. There is still a layer of slough on the surface but there has been more epithelialization. 02/07/2023: The right lateral ankle wound still has some eschar on the surface. The left posterior calf wound is about the same size, but with more areas of epithelialization. 02/20/2023:  The right lateral ankle wound is healed. The left  posterior calf wound is smaller and is essentially flush with the surrounding skin, but the surface is quite dry. Her leg wrap slipped and her calf is quite swollen above the level of the wound. 03/01/2023: The right lateral ankle wound reopened and she also suffered a fall that resulted in a small wound on her right anterior tibial surface. The left posterior calf wound has a much better moisture balance today. There is a layer of slough on the surface. The [employer] worked much better for her as far as compression this week. Patient History Information obtained from Patient. Family History Cancer - Mother,Paternal Grandparents, Diabetes - Siblings, Heart Disease - Father, Hypertension - Father. Social History Former smoker - quit in 1967. Medical History Eyes Patient has history of Cataracts - L eye Ear/Nose/Mouth/Throat Denies history of Chronic sinus problems/congestion, Middle ear problems Hematologic/Lymphatic Denies history of Anemia, Human Immunodeficiency Virus, Lymphedema Respiratory Patient has history of Asthma Cardiovascular Patient has history of Hypertension Endocrine Denies history of Type I Diabetes, Type II Diabetes Neurologic Patient has history of Neuropathy - Feet and finger Denies history of Seizure Disorder Oncologic Patient has history of Received Chemotherapy - 2012, Received Radiation - 2012 Hospitalization/Surgery History - Lower extremity venography 2020. - Intravascular ultrasound. - peripheral vascular intervention. - melanoma 2011. 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 no acute distress. Vitals Time  Taken: 1:25 PM, Height: 63 in, Weight: 200 lbs, BMI: 35.4, Temperature: 98.1 F, Pulse: 74 bpm, Respiratory Rate: 18 breaths/min, Blood Pressure: 161/91 mmHg. Respiratory Normal work of breathing on room air. General Notes: 03/01/2023: The right lateral ankle wound reopened and she also suffered a fall that resulted in a small wound on her right anterior tibial surface. The left posterior calf wound has a much better moisture balance today. There is a layer of slough on the surface. The [employer] worked much better for her as far as compression this week. Integumentary (Hair, Skin) Wound #2 status is Open. Original cause of wound was Blister. The date acquired was: 07/06/2022. The wound has been in treatment 31 weeks. The wound is located on the Left,Posterior Lower Leg. The wound measures 3.6cm length x 2.5cm width x 0.1cm depth; 7.069cm^2 area and 0.707cm^3 volume. There is Fat Layer (Subcutaneous Tissue) exposed. There is no tunneling or undermining noted. There is a medium amount of serosanguineous drainage noted. The wound margin is distinct with the outline attached to the wound base. There is medium (34-66%) red, pink, hyper - granulation within the wound bed. There is a medium (34-66%) amount of necrotic tissue within the wound bed including Adherent Slough. The periwound skin appearance had no abnormalities noted for texture. The periwound skin appearance had no abnormalities noted for moisture. The periwound skin appearance had no abnormalities noted for color. Periwound temperature was noted as No Abnormality. The periwound has tenderness on palpation. Wound #4 status is Open. Original cause of wound was Trauma. The date acquired was: 02/28/2023. The wound is located on the Right,Anterior Lower Leg. The wound measures 0.3cm length x 0.3cm width x 0.1cm depth; 0.071cm^2 area and 0.007cm^3 volume. There is Fat Layer (Subcutaneous Tissue) exposed. There is no tunneling or undermining noted.  There is a small amount of sanguinous  drainage noted. There is large (67-100%) red, hyper - granulation within the wound bed. There is no necrotic tissue within the wound bed. The periwound skin appearance had no abnormalities noted for texture. The periwound skin appearance had no abnormalities noted for moisture. The periwound skin appearance exhibited: Hemosiderin Staining. The periwound skin appearance did not exhibit: Atrophie Blanche, Cyanosis, Ecchymosis, Mottled, Pallor, Rubor, Erythema. Periwound temperature was noted as No Abnormality. Wound #5 status is Open. Original cause of wound was Trauma. The date acquired was: 02/28/2023. The wound is located on the Right,Lateral Ankle. The wound measures 0.1cm length x 0.1cm width x 0.1cm depth; 0.008cm^2 area and 0.001cm^3 volume. There is Fat Layer (Subcutaneous Tissue) exposed. There is no tunneling or undermining noted. There is a small amount of serous drainage noted. There is large (67-100%) pink granulation within the wound bed. There is a small (1-33%) amount of necrotic tissue within the wound bed including Eschar and Adherent Slough. The periwound skin appearance had no abnormalities noted for texture. The periwound skin appearance had no abnormalities noted for moisture. The periwound skin appearance had no abnormalities noted for color. Periwound temperature was noted as No Abnormality. Assessment Active Problems ICD-10 Non-pressure chronic ulcer of other part of left lower leg with fat layer exposed Non-pressure chronic ulcer of other part of right lower leg with other specified severity Varicose veins of bilateral lower extremities with other complications Venous insufficiency (chronic) (peripheral) Polyneuropathy, unspecified Localized edema Cellulitis of right lower limb Procedures Wound #2 Pre-procedure diagnosis of Wound #2 is a Venous Leg Ulcer located on the Left,Posterior Lower Leg .Severity of Tissue Pre Debridement is:  Fat layer exposed. There was a Selective/Open Wound Non-Viable Tissue Debridement with a total area of 7.06 sq cm performed by Duanne Guess, MD. With the following instrument(s): Curette to remove Non-Viable tissue/material. Material removed includes [facility] after achieving pain control using Lidocaine 4% Topical Solution. No specimens were taken. A time out was conducted at 13:57, prior to the start of the procedure. A Minimum amount of bleeding was controlled with Pressure. The procedure was tolerated well with a pain level of 0 throughout and a pain level of 0 following the procedure. Post Debridement Measurements: 3.6cm length x 2.5cm width x 0.1cm depth; 0.707cm^3 volume. Character of Wound/Ulcer Post Debridement is improved. Severity of Tissue Post Debridement is: Fat layer exposed. Post procedure Diagnosis Wound #2: Same as Pre-Procedure General Notes: Scribed for Dr. Lady Gary by J.Scotton. Pre-procedure diagnosis of Wound #2 is a Venous Leg Ulcer located on the Left,Posterior Lower Leg . There was a Radio broadcast assistant Compression Therapy Procedure by Karie Schwalbe, RN. Post procedure Diagnosis Wound #2: Same as Pre-Procedure Wound #4 Pre-procedure diagnosis of Wound #4 is an Abrasion located on the Right,Anterior Lower Leg . There was a Radio broadcast assistant Compression Therapy Procedure by Karie Schwalbe, RN. Post procedure Diagnosis Wound #4: Same as Pre-Procedure Wound #5 Pre-procedure diagnosis of Wound #5 is an Abrasion located on the Right,Lateral Ankle . There was a Radio broadcast assistant Compression Therapy Procedure by Karie Schwalbe, RN. SHADE, RIVENBARK ([id number]) [id number] Page 9 of 12 Post procedure Diagnosis Wound #5: Same as Pre-Procedure Plan Follow-up Appointments: Return Appointment in 1 week. - Dr. Lady Gary Room 3 Anesthetic: Wound #2 Left,Posterior Lower Leg: (In clinic) Topical Lidocaine 4% applied to wound bed Bathing/ Shower/ Hygiene: May shower with  protection but do not get wound dressing(s) wet. Protect dressing(s) with water repellant cover (for example, large plastic bag) or a cast cover and may then take  shower. Edema Control - Lymphedema / SCD / Other: Lymphedema Pumps. Use Lymphedema pumps on leg(s) 2-3 times a day for 45-60 minutes. If wearing any wraps or hose, do not remove them. Continue exercising as instructed. - Use x1 per day Avoid standing for long periods of time. Exercise regularly Off-Loading: Wound #2 Left,Posterior Lower Leg: Other: - Elevate legs at heart level or above heart level while sitting. Wound #4 Right,Anterior Lower Leg: Other: - Elevate legs at heart level or above heart level while sitting. Wound #5 Right,Lateral Ankle: Other: - Elevate legs at heart level or above heart level while sitting. WOUND #2: - Lower Leg Wound Laterality: Left, Posterior Cleanser: Soap and Water 1 x Per Week/30 Days Discharge Instructions: May shower and wash wound with dial antibacterial soap and water prior to dressing change. Peri-Wound Care: Sween Lotion (Moisturizing lotion) 1 x Per Week/30 Days Discharge Instructions: Apply moisturizing lotion as directed Topical: Keystone 1 x Per Week/30 Days Discharge Instructions: On hold 03/01/23 Prim Dressing: Santyl Ointment 1 x Per Week/30 Days ary Discharge Instructions: Apply nickel thick amount to wound bed as instructed Secondary Dressing: T Non-adherent Dressing, 2x3 in 1 x Per Week/30 Days elfa Discharge Instructions: Apply over primary dressing as directed. Secondary Dressing: Zetuvit Plus 4x8 in 1 x Per Week/30 Days Discharge Instructions: Apply over primary dressing as directed. Secured With: 61M Medipore Scientist, research (life sciences) Surgical T 2x10 (in/yd) 1 x Per Week/30 Days ape Discharge Instructions: Secure with tape as directed. Com pression Wrap: Unnaboot w/Calamine, 4x10 (in/yd) 1 x Per Week/30 Days Discharge Instructions: Apply Unnaboot as directed. WOUND #4: - Lower Leg  Wound Laterality: Right, Anterior Cleanser: Vashe 5.8 (oz) 1 x Per Week/30 Days Discharge Instructions: Cleanse the wound with Vashe prior to applying a clean dressing using gauze sponges, not tissue or cotton balls. Cleanser: Wound Cleanser 1 x Per Week/30 Days Discharge Instructions: Cleanse the wound with wound cleanser prior to applying a clean dressing using gauze sponges, not tissue or cotton balls. Prim Dressing: Maxorb Extra Calcium Alginate, 2x2 (in/in) 1 x Per Week/30 Days ary Discharge Instructions: Apply to wound bed as instructed Secondary Dressing: Woven Gauze Sponge, Non-Sterile 4x4 in 1 x Per Week/30 Days Discharge Instructions: Apply over primary dressing as directed. Com pression Wrap: Unnaboot w/Calamine, 4x10 (in/yd) 1 x Per Week/30 Days Discharge Instructions: Apply Unnaboot as directed. Com pression Wrap: Stockinette 1 x Per Week/30 Days WOUND #5: - Ankle Wound Laterality: Right, Lateral Cleanser: Vashe 5.8 (oz) 1 x Per Week/30 Days Discharge Instructions: Cleanse the wound with Vashe prior to applying a clean dressing using gauze sponges, not tissue or cotton balls. Cleanser: Wound Cleanser 1 x Per Week/30 Days Discharge Instructions: Cleanse the wound with wound cleanser prior to applying a clean dressing using gauze sponges, not tissue or cotton balls. Prim Dressing: Maxorb Extra Calcium Alginate, 2x2 (in/in) 1 x Per Week/30 Days ary Discharge Instructions: Apply to wound bed as instructed Secondary Dressing: Woven Gauze Sponge, Non-Sterile 4x4 in 1 x Per Week/30 Days Discharge Instructions: Apply over primary dressing as directed. Com pression Wrap: Unnaboot w/Calamine, 4x10 (in/yd) 1 x Per Week/30 Days Discharge Instructions: Apply Unnaboot as directed. Com pression Wrap: Stockinette 1 x Per Week/30 Days 03/01/2023: The right lateral ankle wound reopened and she also suffered a fall that resulted in a small wound on her right anterior tibial surface. The  left posterior calf wound has a much better moisture balance today. There is a layer of slough on the surface. The [employer]  worked much better for her as far as compression this week. I used a curette to debride eschar off of the right lateral ankle wound. We will apply silver alginate here and to the new anterior tibial wound. I debrided slough off of the posterior calf wound. We will continue Santyl to this site. Will use bilateral calamine based Unna boots. She will follow-up in 1 week. Electronic Signature(s) Signed: 03/04/2023 5:00:20 PM By: Shawn Stall RN, [name] Signed: 03/05/2023 8:01:38 AM By: Duanne Guess MD FACS Previous Signature: 03/01/2023 2:31:15 PM Version By: Duanne Guess MD FACS Entered By: Shawn Stall on 03/04/2023 16:53:19 MARRIETTA, THUNDER ([id number]) [id number] Page 10 of 12 -------------------------------------------------------------------------------- HxROS Details Patient Name: Date of Service: [name], [name] 03/01/2023 1:15 PM Medical Record Number: [id number] Patient Account Number: [id number] Date of Birth/Sex: Treating RN: 1946/09/18 (77 y.o. F) Primary Care Provider: Eustaquio Boyden Other Clinician: Referring Provider: Treating Provider/Extender: Priscille Loveless in Treatment: 31 Information Obtained From Patient Eyes Medical History: Positive for: Cataracts - L eye Ear/Nose/Mouth/Throat Medical History: Negative for: Chronic sinus problems/congestion; Middle ear problems Hematologic/Lymphatic Medical History: Negative for: Anemia; Human Immunodeficiency Virus; Lymphedema Respiratory Medical History: Positive for: Asthma Past Medical History Notes: OSA on CPAP Cardiovascular Medical History: Positive for: Hypertension Past Medical History Notes: Cronic venous insufficiency 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 Gastrointestinal Medical History: Past Medical  History Notes: liver cyst Endocrine Medical History: Negative for: Type I Diabetes; Type II Diabetes Musculoskeletal Medical History: Past Medical History Notes: arthritis Neurologic Medical History: Positive for: Neuropathy - Feet and finger Negative for: Seizure Disorder Past Medical History Notes: restless leg syndrome Oncologic Medical History: Positive for: Received Chemotherapy - 2012; Received Radiation - 2012 HBO Extended History Items Eyes: CIELLA, OBI ([id number]) [id number] Page 11 of 12 Cataracts Immunizations Pneumococcal Vaccine: Received Pneumococcal Vaccination: Yes Received Pneumococcal Vaccination On or After 60th Birthday: Yes Implantable Devices No devices added Hospitalization / Surgery History Type of Hospitalization/Surgery Lower extremity venography 2020 Intravascular ultrasound peripheral vascular intervention melanoma 2011 Family and Social History Cancer: Yes - Mother,Paternal Grandparents; Diabetes: Yes - Siblings; Heart Disease: Yes - Father; Hypertension: Yes - Father; Former smoker - quit in [employer]) Signed: 03/01/2023 4:46:59 PM By: Duanne Guess MD FACS Entered By: Duanne Guess on 03/01/2023 14:27:51 -------------------------------------------------------------------------------- SuperBill Details Patient Name: Date of Service: MACKENZE, [name] 03/01/2023 Medical Record Number: [id number] Patient Account Number: [id number] Date of Birth/Sex: Treating RN: 1946/02/08 (77 y.o. F) Primary Care Provider: Eustaquio Boyden Other Clinician: Referring Provider: Treating Provider/Extender: Priscille Loveless in Treatment: 31 Diagnosis Coding ICD-10 Codes Code Description [phone] Non-pressure chronic ulcer of other part of left lower leg with fat layer exposed L03.115 Cellulitis of right lower limb L97.818 Non-pressure chronic ulcer of other part of right lower  leg with other specified severity I83.893 Varicose veins of bilateral lower extremities with other complications I87.2 Venous insufficiency (chronic) (peripheral) G62.9 Polyneuropathy, unspecified R60.0 Localized edema Facility Procedures : CPT4 Code: [id number] Description: 97597 - DEBRIDE WOUND 1ST 20 SQ CM OR < ICD-10 Diagnosis Description L97.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d L97.818 Non-pressure chronic ulcer of other part of right lower leg with other  specified sev Modifier: erity Quantity: 1 Physician Procedures : CPT4 Code Description Modifier [id number] - WC PHYS LEVEL 4 - EST PT 25 ICD-10 Diagnosis Description L97.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d L97.818 Non-pressure  chronic ulcer of other part of right  lower leg with other specified severity I87.2 Venous insufficiency (chronic) (peripheral) R60.0 Localized edema Quantity: 1 : [id number] 97597 - WC PHYS DEBR WO ANESTH 20 SQ CM ICD-10 Diagnosis Description L97.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d HARRY, BARK ([id number]) [id number] L97.818 Non-pressure  chronic ulcer of other part of right lower leg with other specified severity Quantity: 1 .pdf Page 12 of 12 Electronic Signature(s) Signed: 03/01/2023 2:31:44 PM By: Duanne Guess MD FACS Entered By: Duanne Guess on 03/01/2023 14:31:44

## 2023-03-02 NOTE — Progress Notes (Signed)
[name], [name] ([id number]) [id number] Page 1 of 11 Visit Report for 03/01/2023 Arrival Information Details Patient Name: Date of Service: [name], [name] 03/01/2023 1:15 PM Medical Record Number: [id number] Patient Account Number: [id number] Date of Birth/Sex: Treating RN: December 12, 1945 (77 y.o. Katrinka Blazing Primary Care Huberta Tompkins: Eustaquio Boyden Other Clinician: Referring Damarco Keysor: Treating Jaxten Brosh/Extender: Priscille Loveless in Treatment: 31 Visit Information History Since Last Visit Added or deleted any medications: No Patient Arrived: Ambulatory Any new allergies or adverse reactions: No Arrival Time: 13:23 Had a fall or experienced change in No Accompanied By: self activities of daily living that may affect Transfer Assistance: None risk of falls: Patient Identification Verified: Yes Signs or symptoms of abuse/neglect since last visito No Patient Requires Transmission-Based Precautions: No Hospitalized since last visit: No Patient Has Alerts: No Implantable device outside of the clinic excluding No cellular tissue based products placed in the center since last visit: Has Dressing in Place as Prescribed: Yes Has Compression in Place as Prescribed: Yes Pain Present Now: Yes Electronic Signature(s) Signed: 03/01/2023 4:37:07 PM By: Karie Schwalbe RN Entered By: Karie Schwalbe on 03/01/2023 13:26:41 -------------------------------------------------------------------------------- Compression Therapy Details Patient Name: Date of Service: [name], [name] 03/01/2023 1:15 PM Medical Record Number: [id number] Patient Account Number: [id number] Date of Birth/Sex: Treating RN: June 30, 1946 (77 y.o. Katrinka Blazing Primary Care Vasil Juhasz: Eustaquio Boyden Other Clinician: Referring Dorothy Polhemus: Treating Makinley Muscato/Extender: Priscille Loveless in Treatment: 31 Compression Therapy Performed for Wound  Assessment: Wound #2 Left,Posterior Lower Leg Performed By: Clinician Karie Schwalbe, RN Compression Type: Henriette Combs Post Procedure Diagnosis Same as Pre-procedure Electronic Signature(s) Signed: 03/01/2023 4:37:07 PM By: Karie Schwalbe RN Entered By: Karie Schwalbe on 03/01/2023 15:48:52 -------------------------------------------------------------------------------- Compression Therapy Details Patient Name: Date of Service: [name], [name] 03/01/2023 1:15 PM Medical Record Number: [id number] Patient Account Number: [id number] Date of Birth/Sex: Treating RN: July 24, 1946 (77 y.o. Katrinka Blazing Primary Care Oluwatosin Bracy: Eustaquio Boyden Other Clinician: Referring Melany Wiesman: Treating Elene Downum/Extender: Priscille Loveless in Treatment: 31 Compression Therapy Performed for Wound Assessment: Wound #4 Right,Anterior Lower Leg Performed By: Clinician Karie Schwalbe, RN Compression Type: Henriette Combs Post Procedure Diagnosis Same as KEANNA, TUGWELL ([id number]) [id number] Page 2 of 11 Electronic Signature(s) Signed: 03/01/2023 4:37:07 PM By: Karie Schwalbe RN Entered By: Karie Schwalbe on 03/01/2023 15:48:52 -------------------------------------------------------------------------------- Compression Therapy Details Patient Name: Date of Service: [name], [name] 03/01/2023 1:15 PM Medical Record Number: [id number] Patient Account Number: [id number] Date of Birth/Sex: Treating RN: 06-24-1946 (77 y.o. Katrinka Blazing Primary Care Ikhlas Albo: Eustaquio Boyden Other Clinician: Referring Arrie Zuercher: Treating Dashon Mcintire/Extender: Priscille Loveless in Treatment: 31 Compression Therapy Performed for Wound Assessment: Wound #5 Right,Lateral Ankle Performed By: Clinician Karie Schwalbe, RN Compression Type: Henriette Combs Post Procedure Diagnosis Same as Pre-procedure Electronic Signature(s) Signed: 03/01/2023  4:37:07 PM By: Karie Schwalbe RN Entered By: Karie Schwalbe on 03/01/2023 15:48:52 -------------------------------------------------------------------------------- Encounter Discharge Information Details Patient Name: Date of Service: [name], [name] 03/01/2023 1:15 PM Medical Record Number: [id number] Patient Account Number: [id number] Date of Birth/Sex: Treating RN: 07/16/1946 (77 y.o. Katrinka Blazing Primary Care Kyston Gonce: Eustaquio Boyden Other Clinician: Referring Shanta Dorvil: Treating Nailah Luepke/Extender: Priscille Loveless in Treatment: 31 Encounter Discharge Information Items Post Procedure Vitals Discharge Condition: Stable Temperature (F): 98.1 Ambulatory Status: Ambulatory Pulse (bpm): 74 Discharge Destination: Home Respiratory Rate (breaths/min): 18 Transportation: Private Auto Blood Pressure (mmHg): 161/91 Accompanied By: self Schedule Follow-up Appointment: Yes Clinical Summary of Care: Patient Declined Electronic Signature(s) Signed: 03/01/2023  4:37:07 PM By: Karie Schwalbe RN Entered By: Karie Schwalbe on 03/01/2023 15:50:37 -------------------------------------------------------------------------------- Lower Extremity Assessment Details Patient Name: Date of Service: [name], [name] 03/01/2023 1:15 PM Medical Record Number: [id number] Patient Account Number: [id number] Date of Birth/Sex: Treating RN: 06-Dec-1945 (77 y.o. Katrinka Blazing Primary Care Jelicia Nantz: Eustaquio Boyden Other Clinician: Referring Ragen Laver: Treating Jahan Friedlander/Extender: Katharina Caper Weeks in Treatment: 31 Edema Assessment Assessed: [Left: No] [Right: No] Edema: [Left: Yes] [Right: Yes] Calf Left: Right: [name], [name] ([id number]) [phone].pdf Page 3 of 11 Point of Measurement: From Medial Instep 44 cm 46 cm Ankle Left: Right: Point of Measurement: From Medial Instep 24 cm 26 cm Vascular  Assessment Pulses: Dorsalis Pedis Palpable: [Left:Yes] [Right:Yes] Electronic Signature(s) Signed: 03/01/2023 4:37:07 PM By: Karie Schwalbe RN Entered By: Karie Schwalbe on 03/01/2023 13:39:38 -------------------------------------------------------------------------------- Multi Wound Chart Details Patient Name: Date of Service: [name], [name] 03/01/2023 1:15 PM Medical Record Number: [id number] Patient Account Number: [id number] Date of Birth/Sex: Treating RN: February 26, 1946 (77 y.o. F) Primary Care [name] [name]: Eustaquio Boyden Other Clinician: Referring Britteny Fiebelkorn: Treating Janda Cargo/Extender: Priscille Loveless in Treatment: 31 Vital Signs Height(in): 63 Pulse(bpm): 74 Weight(lbs): 200 Blood Pressure(mmHg): 161/91 Body Mass Index(BMI): 35.4 Temperature(F): 98.1 Respiratory Rate(breaths/min): 18 [2:Photos:] Left, Posterior Lower Leg Right, Anterior Lower Leg Right, Lateral Ankle Wound Location: Blister Trauma Trauma Wounding Event: Venous Leg Ulcer Abrasion Abrasion Primary Etiology: Cataracts, Asthma, Hypertension, Cataracts, Asthma, Hypertension, Cataracts, Asthma, Hypertension, Comorbid History: Neuropathy, Received Chemotherapy, Neuropathy, Received Chemotherapy, Neuropathy, Received Chemotherapy, Received Radiation Received Radiation Received Radiation 07/06/2022 02/28/2023 02/28/2023 Date Acquired: 31 0 0 Weeks of Treatment: Open Open Open Wound Status: No No No Wound Recurrence: 3.6x2.5x0.1 0.3x0.3x0.1 0.1x0.1x0.1 Measurements L x W x D (cm) 7.069 0.071 0.008 A (cm) : rea 0.707 0.007 0.001 Volume (cm) : -2.90% N/A N/A % Reduction in Area: -2.90% N/A N/A % Reduction in Volume: Full Thickness Without Exposed Full Thickness Without Exposed Full Thickness Without Exposed Classification: Support Structures Support Structures Support Structures Medium Small Small Exudate A mount: Serosanguineous Sanguinous Serous Exudate Type: red, brown  red amber Exudate Color: Distinct, outline attached N/A N/A Wound Margin: Medium (34-66%) Large (67-100%) Large (67-100%) Granulation Amount: Red, Pink, Hyper-granulation Red, Hyper-granulation Pink Granulation Quality: Medium (34-66%) None Present (0%) Small (1-33%) Necrotic Amount: Adherent Slough N/A Eschar, Adherent Slough Necrotic Tissue: Fat Layer (Subcutaneous Tissue): Yes Fat Layer (Subcutaneous Tissue): Yes Fat Layer (Subcutaneous Tissue): Yes Exposed Structures: Fascia: No Fascia: No Fascia: No Tendon: No Tendon: No Tendon: No Muscle: No Muscle: No Muscle: No Joint: No Joint: No Joint: No [name], [name] ([id number]) [phone].pdf Page 4 of 11 Bone: No Bone: No Bone: No Medium (34-66%) Small (1-33%) None Epithelialization: Debridement - Selective/Open Wound N/A N/A Debridement: 13:57 N/A N/A Pre-procedure Verification/Time Out Taken: Lidocaine 4% Topical Solution N/A N/A Pain Control: Slough N/A N/A Tissue Debrided: Non-Viable Tissue N/A N/A Level: 7.06 N/A N/A Debridement A (sq cm): rea Curette N/A N/A Instrument: Minimum N/A N/A Bleeding: Pressure N/A N/A Hemostasis A chieved: 0 N/A N/A Procedural Pain: 0 N/A N/A Post Procedural Pain: Procedure was tolerated well N/A N/A Debridement Treatment Response: 3.6x2.5x0.1 N/A N/A Post Debridement Measurements L x W x D (cm) 0.707 N/A N/A Post Debridement Volume: (cm) Scarring: Yes Excoriation: No No Abnormalities Noted Periwound Skin Texture: Induration: No Callus: No Crepitus: No Rash: No Scarring: No No Abnormalities Noted Maceration: No No Abnormalities Noted Periwound Skin Moisture: Dry/Scaly: No Rubor: No Hemosiderin Staining: Yes No Abnormalities Noted Periwound Skin Color: Atrophie Blanche:  No Cyanosis: No Ecchymosis: No Erythema: No Mottled: No Pallor: No Rubor: No No Abnormality No Abnormality No Abnormality Temperature: Yes N/A N/A Tenderness  on Palpation: Debridement N/A N/A Procedures Performed: Treatment Notes Electronic Signature(s) Signed: 03/01/2023 2:25:59 PM By: Duanne Guess MD FACS Entered By: Duanne Guess on 03/01/2023 14:25:59 -------------------------------------------------------------------------------- Multi-Disciplinary Care Plan Details Patient Name: Date of Service: [name], [name] 03/01/2023 1:15 PM Medical Record Number: [id number] Patient Account Number: [id number] Date of Birth/Sex: Treating RN: 1945-12-03 (77 y.o. Katrinka Blazing Primary Care Laretha Luepke: Eustaquio Boyden Other Clinician: Referring Carel Carrier: Treating Brenin Heidelberger/Extender: Priscille Loveless in Treatment: 31 Multidisciplinary Care Plan reviewed with physician Active Inactive Venous Leg Ulcer Nursing Diagnoses: Knowledge deficit related to disease process and management Potential for venous Insuffiency (use before diagnosis confirmed) Goals: Patient will maintain optimal edema control Date Initiated: 02/07/2023 Target Resolution Date: 07/05/2023 Goal Status: Active Interventions: Assess peripheral edema status every visit. Compression as ordered Treatment Activities: Therapeutic compression applied : 02/07/2023 ZENNIE, AYARS ([id number]) [phone].pdf Page 5 of 11 Notes: Wound/Skin Impairment Nursing Diagnoses: Impaired tissue integrity Goals: Ulcer/skin breakdown will have a volume reduction of 30% by week 4 Date Initiated: 08/03/2022 Date Inactivated: 09/10/2022 Target Resolution Date: 08/31/2022 Unmet Reason: uncontrolled edema, Goal Status: Unmet acute infection Ulcer/skin breakdown will have a volume reduction of 50% by week 8 Date Initiated: 09/10/2022 Target Resolution Date: 07/05/2023 Goal Status: Active Interventions: Assess ulceration(s) every visit Treatment Activities: Skin care regimen initiated : 08/03/2022 Notes: Keystone ordered. 10/30 RX Levo started.  09/10/22 Electronic Signature(s) Signed: 03/01/2023 4:37:07 PM By: Karie Schwalbe RN Entered By: Karie Schwalbe on 03/01/2023 15:49:22 -------------------------------------------------------------------------------- Pain Assessment Details Patient Name: Date of Service: [name], [name] 03/01/2023 1:15 PM Medical Record Number: [id number] Patient Account Number: [id number] Date of Birth/Sex: Treating RN: 19-Sep-1946 (77 y.o. Katrinka Blazing Primary Care Natilee Gauer: Eustaquio Boyden Other Clinician: Referring Lucky Alverson: Treating Canyon Lohr/Extender: Priscille Loveless in Treatment: 31 Active Problems Location of Pain Severity and Description of Pain Patient Has Paino Yes Site Locations Pain Location: Generalized Pain With Dressing Change: No Duration of the Pain. Constant / Intermittento Constant Rate the pain. Current Pain Level: 3 Worst Pain Level: 10 Least Pain Level: 3 Tolerable Pain Level: 3 Character of Pain Describe the Pain: Burning Pain Management and Medication Current Pain Management: Medication: Yes Cold Application: No Rest: Yes Massage: No Activity: No T.E.N.S.: No Heat Application: No Leg drop or elevation: No Is the Current Pain Management Adequate: Adequate WYNETTA, SEITH ([id number]) [phone].pdf Page 6 of 11 How does your wound impact your activities of daily livingo Sleep: No Bathing: No Appetite: No Relationship With Others: No Bladder Continence: No Emotions: No Bowel Continence: No Work: No Toileting: No Drive: No Dressing: No Hobbies: No Electronic Signature(s) Signed: 03/01/2023 4:37:07 PM By: Karie Schwalbe RN Entered By: Karie Schwalbe on 03/01/2023 13:28:24 -------------------------------------------------------------------------------- Patient/Caregiver Education Details Patient Name: Date of Service: [name] 4/26/2024andnbsp1:15 PM Medical Record Number: [id number] Patient  Account Number: [id number] Date of Birth/Gender: Treating RN: [birth year] (77 y.o. Katrinka Blazing Primary Care Physician: Eustaquio Boyden Other Clinician: Referring Physician: Treating Physician/Extender: Priscille Loveless in Treatment: 31 Education Assessment Education Provided To: Patient Education Topics Provided Wound/Skin Impairment: Methods: Explain/Verbal Responses: Return demonstration correctly Electronic Signature(s) Signed: 03/01/2023 4:37:07 PM By: Karie Schwalbe RN Entered By: Karie Schwalbe on 03/01/2023 15:49:38 -------------------------------------------------------------------------------- Wound Assessment Details Patient Name: Date of Service: AYSHA, LIVECCHI 03/01/2023 1:15 PM Medical Record Number: [id number] Patient Account Number: [id number] Date  of Birth/Sex: Treating RN: [birth year] (77 y.o. Katrinka Blazing Primary Care Lizandra Zakrzewski: Eustaquio Boyden Other Clinician: Referring Schon Zeiders: Treating Vegas Fritze/Extender: Katharina Caper Weeks in Treatment: 31 Wound Status Wound Number: 2 Primary Venous Leg Ulcer Etiology: Wound Location: Left, Posterior Lower Leg Wound Open Wounding Event: Blister Status: Date Acquired: 07/06/2022 Comorbid Cataracts, Asthma, Hypertension, Neuropathy, Received Weeks Of Treatment: 31 History: Chemotherapy, Received Radiation Clustered Wound: No Photos CACIE, GASKINS ([id number]) [phone].pdf Page 7 of 11 Wound Measurements Length: (cm) 3.6 Width: (cm) 2.5 Depth: (cm) 0.1 Area: (cm) 7.069 Volume: (cm) 0.707 % Reduction in Area: -2.9% % Reduction in Volume: -2.9% Epithelialization: Medium (34-66%) Tunneling: No Undermining: No Wound Description Classification: Full Thickness Without Exposed Support Structures Wound Margin: Distinct, outline attached Exudate Amount: Medium Exudate Type: Serosanguineous Exudate Color: red, brown Foul Odor After  Cleansing: No Slough/Fibrino Yes Wound Bed Granulation Amount: Medium (34-66%) Exposed Structure Granulation Quality: Red, Pink, Hyper-granulation Fascia Exposed: No Necrotic Amount: Medium (34-66%) Fat Layer (Subcutaneous Tissue) Exposed: Yes Necrotic Quality: Adherent Slough Tendon Exposed: No Muscle Exposed: No Joint Exposed: No Bone Exposed: No Periwound Skin Texture Texture Color No Abnormalities Noted: Yes No Abnormalities Noted: Yes Moisture Temperature / Pain No Abnormalities Noted: Yes Temperature: No Abnormality Tenderness on Palpation: Yes Treatment Notes Wound #2 (Lower Leg) Wound Laterality: Left, Posterior Cleanser Soap and Water Discharge Instruction: May shower and wash wound with dial antibacterial soap and water prior to dressing change. Peri-Wound Care Sween Lotion (Moisturizing lotion) Discharge Instruction: Apply moisturizing lotion as directed Topical Keystone Discharge Instruction: On hold 03/01/23 Primary Dressing Santyl Ointment Discharge Instruction: Apply nickel thick amount to wound bed as instructed Secondary Dressing T Non-adherent Dressing, 2x3 in elfa Discharge Instruction: Apply over primary dressing as directed. Zetuvit Plus 4x8 in Discharge Instruction: Apply over primary dressing as directed. Secured With [employer] T 2x10 (in/yd) ape Discharge Instruction: Secure with tape as directed. KHADEJAH, SON ([id number]) [id number] Page 8 of 11 Compression Wrap Unnaboot w/Calamine, 4x10 (in/yd) Discharge Instruction: Apply Unnaboot as directed. Compression Stockings Add-Ons Electronic Signature(s) Signed: 03/01/2023 4:37:07 PM By: Karie Schwalbe RN Entered By: Karie Schwalbe on 03/01/2023 13:50:46 -------------------------------------------------------------------------------- Wound Assessment Details Patient Name: Date of Service: LAYLYNN, CAMPANELLA 03/01/2023 1:15 PM Medical Record  Number: [id number] Patient Account Number: [id number] Date of Birth/Sex: Treating RN: 06-24-1946 (77 y.o. Katrinka Blazing Primary Care Claribel Sachs: Eustaquio Boyden Other Clinician: Referring Randall Colden: Treating Codey Burling/Extender: Katharina Caper Weeks in Treatment: 31 Wound Status Wound Number: 4 Primary Abrasion Etiology: Wound Location: Right, Anterior Lower Leg Wound Open Wounding Event: Trauma Status: Date Acquired: 02/28/2023 Comorbid Cataracts, Asthma, Hypertension, Neuropathy, Received Weeks Of Treatment: 0 History: Chemotherapy, Received Radiation Clustered Wound: No Photos Wound Measurements Length: (cm) 0.3 Width: (cm) 0.3 Depth: (cm) 0.1 Area: (cm) 0.071 Volume: (cm) 0.007 % Reduction in Area: % Reduction in Volume: Epithelialization: Small (1-33%) Tunneling: No Undermining: No Wound Description Classification: Full Thickness Without Exposed Support Structures Exudate Amount: Small Exudate Type: Sanguinous Exudate Color: red Foul Odor After Cleansing: No Slough/Fibrino No Wound Bed Granulation Amount: Large (67-100%) Exposed Structure Granulation Quality: Red, Hyper-granulation Fascia Exposed: No Necrotic Amount: None Present (0%) Fat Layer (Subcutaneous Tissue) Exposed: Yes Tendon Exposed: No Muscle Exposed: No Joint Exposed: No Bone Exposed: No Periwound Skin Texture Texture Color No Abnormalities Noted: Yes No Abnormalities Noted: No Atrophie Blanche: No Moisture Cyanosis: No No Abnormalities NotedZAKYRA, KUKUK ([id number]) [id number] Page 9 of 11 Ecchymosis: No Erythema: No Hemosiderin Staining: Yes Mottled:  No Pallor: No Rubor: No Temperature / Pain Temperature: No Abnormality Treatment Notes Wound #4 (Lower Leg) Wound Laterality: Right, Anterior Cleanser Vashe 5.8 (oz) Discharge Instruction: Cleanse the wound with Vashe prior to applying a clean dressing using gauze sponges, not  tissue or cotton balls. Wound Cleanser Discharge Instruction: Cleanse the wound with wound cleanser prior to applying a clean dressing using gauze sponges, not tissue or cotton balls. Peri-Wound Care Topical Primary Dressing Maxorb Extra Calcium Alginate, 2x2 (in/in) Discharge Instruction: Apply to wound bed as instructed Secondary Dressing Woven Gauze Sponge, Non-Sterile 4x4 in Discharge Instruction: Apply over primary dressing as directed. Secured With Compression Wrap Unnaboot w/Calamine, 4x10 (in/yd) Discharge Instruction: Apply Unnaboot as directed. [place] Compression Stockings Facilities manager) Signed: 03/01/2023 4:37:07 PM By: Karie Schwalbe RN Entered By: Karie Schwalbe on 03/01/2023 13:51:15 -------------------------------------------------------------------------------- Wound Assessment Details Patient Name: Date of Service: SARRAH, FIORENZA 03/01/2023 1:15 PM Medical Record Number: [id number] Patient Account Number: [id number] Date of Birth/Sex: Treating RN: [birth year] (77 y.o. Katrinka Blazing Primary Care Quinisha Mould: Eustaquio Boyden Other Clinician: Referring Travas Schexnayder: Treating Yostin Malacara/Extender: Katharina Caper Weeks in Treatment: 31 Wound Status Wound Number: 5 Primary Abrasion Etiology: Wound Location: Right, Lateral Ankle Wound Open Wounding Event: Trauma Status: Date Acquired: 02/28/2023 Comorbid Cataracts, Asthma, Hypertension, Neuropathy, Received Weeks Of Treatment: 0 History: Chemotherapy, Received Radiation Clustered Wound: No Photos CELE, MOTE ([id number]) [id number] Page 10 of 11 Wound Measurements Length: (cm) 0.1 Width: (cm) 0.1 Depth: (cm) 0.1 Area: (cm) 0.008 Volume: (cm) 0.001 % Reduction in Area: % Reduction in Volume: Epithelialization: None Tunneling: No Undermining: No Wound Description Classification: Full Thickness Without Exposed Support  Structures Exudate Amount: Small Exudate Type: Serous Exudate Color: amber Foul Odor After Cleansing: No Slough/Fibrino Yes Wound Bed Granulation Amount: Large (67-100%) Exposed Structure Granulation Quality: Pink Fascia Exposed: No Necrotic Amount: Small (1-33%) Fat Layer (Subcutaneous Tissue) Exposed: Yes Necrotic Quality: Eschar, Adherent Slough Tendon Exposed: No Muscle Exposed: No Joint Exposed: No Bone Exposed: No Periwound Skin Texture Texture Color No Abnormalities Noted: Yes No Abnormalities Noted: Yes Moisture Temperature / Pain No Abnormalities Noted: Yes Temperature: No Abnormality Treatment Notes Wound #5 (Ankle) Wound Laterality: Right, Lateral Cleanser Vashe 5.8 (oz) Discharge Instruction: Cleanse the wound with Vashe prior to applying a clean dressing using gauze sponges, not tissue or cotton balls. Wound Cleanser Discharge Instruction: Cleanse the wound with wound cleanser prior to applying a clean dressing using gauze sponges, not tissue or cotton balls. Peri-Wound Care Topical Primary Dressing Maxorb Extra Calcium Alginate, 2x2 (in/in) Discharge Instruction: Apply to wound bed as instructed Secondary Dressing Woven Gauze Sponge, Non-Sterile 4x4 in Discharge Instruction: Apply over primary dressing as directed. Secured With Compression Wrap Unnaboot w/Calamine, 4x10 (in/yd) Discharge Instruction: Apply Unnaboot as directed. [place] Compression Stockings Add-Ons DAILYN, REITH ([id number]) [id number] Page 11 of 11 Electronic Signature(s) Signed: 03/01/2023 4:37:07 PM By: Karie Schwalbe RN Entered By: Karie Schwalbe on 03/01/2023 14:08:30 -------------------------------------------------------------------------------- Vitals Details Patient Name: Date of Service: SAPIR, LAVEY 03/01/2023 1:15 PM Medical Record Number: [id number] Patient Account Number: [id number] Date of Birth/Sex: Treating RN: 1946-09-07 (77 y.o.  Katrinka Blazing Primary Care Odis Wickey: Eustaquio Boyden Other Clinician: Referring Kaymarie Wynn: Treating Adely Facer/Extender: Priscille Loveless in Treatment: 31 Vital Signs Time Taken: 13:25 Temperature (F): 98.1 Height (in): 63 Pulse (bpm): 74 Weight (lbs): 200 Respiratory Rate (breaths/min): 18 Body Mass Index (BMI): 35.4 Blood Pressure (mmHg): 161/91 Reference Range: 80 - 120 mg / dl Electronic Signature(s) Signed: 03/01/2023 4:37:07 PM By:  Karie Schwalbe RN Entered By: Karie Schwalbe on 03/01/2023 13:45:21

## 2023-03-06 ENCOUNTER — Encounter (HOSPITAL_BASED_OUTPATIENT_CLINIC_OR_DEPARTMENT_OTHER): Payer: Medicare Other | Attending: General Surgery | Admitting: General Surgery

## 2023-03-06 DIAGNOSIS — L97222 Non-pressure chronic ulcer of left calf with fat layer exposed: Secondary | ICD-10-CM | POA: Diagnosis not present

## 2023-03-06 DIAGNOSIS — L97818 Non-pressure chronic ulcer of other part of right lower leg with other specified severity: Secondary | ICD-10-CM | POA: Diagnosis not present

## 2023-03-06 DIAGNOSIS — L97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d: Secondary | ICD-10-CM | POA: Diagnosis not present

## 2023-03-06 DIAGNOSIS — R600 Localized edema: Secondary | ICD-10-CM | POA: Insufficient documentation

## 2023-03-06 DIAGNOSIS — I872 Venous insufficiency (chronic) (peripheral): Secondary | ICD-10-CM | POA: Insufficient documentation

## 2023-03-06 DIAGNOSIS — I83893 Varicose veins of bilateral lower extremities with other complications: Secondary | ICD-10-CM | POA: Insufficient documentation

## 2023-03-06 DIAGNOSIS — L03115 Cellulitis of right lower limb: Secondary | ICD-10-CM | POA: Diagnosis not present

## 2023-03-06 DIAGNOSIS — G629 Polyneuropathy, unspecified: Secondary | ICD-10-CM | POA: Insufficient documentation

## 2023-03-11 ENCOUNTER — Telehealth: Payer: Self-pay

## 2023-03-11 ENCOUNTER — Ambulatory Visit (INDEPENDENT_AMBULATORY_CARE_PROVIDER_SITE_OTHER): Payer: Medicare Other | Admitting: Family Medicine

## 2023-03-11 ENCOUNTER — Encounter: Payer: Self-pay | Admitting: Family Medicine

## 2023-03-11 VITALS — BP 128/82 | HR 78 | Temp 97.9°F | Ht 61.0 in | Wt 211.2 lb

## 2023-03-11 DIAGNOSIS — S50869A Insect bite (nonvenomous) of unspecified forearm, initial encounter: Secondary | ICD-10-CM

## 2023-03-11 DIAGNOSIS — W57XXXA Bitten or stung by nonvenomous insect and other nonvenomous arthropods, initial encounter: Secondary | ICD-10-CM | POA: Insufficient documentation

## 2023-03-11 MED ORDER — PREGABALIN 25 MG PO CAPS: 25.0000 mg | ORAL_CAPSULE | Freq: Two times a day (BID) | ORAL | 0 refills | Status: DC

## 2023-03-11 MED ORDER — PREDNISONE 20 MG PO TABS: 20.0000 mg | ORAL_TABLET | Freq: Every day | ORAL | 0 refills | Status: DC

## 2023-03-11 NOTE — Telephone Encounter (Signed)
Agree with ER precautions  °I will see her then °

## 2023-03-11 NOTE — Progress Notes (Signed)
Subjective:    Patient ID: [name], female    DOB: Sep 22, 1946, 77 y.o.   MRN: [id number]  HPI 77 yo pt of Dr Reece Agar presents with insect bites Needs refill of lyrical with new instructions also (previously was ramping up)  Wt Readings from Last 3 Encounters:  03/11/23 211 lb 3.2 oz (95.8 kg)  12/12/22 199 lb (90.3 kg)  11/16/22 199 lb 2 oz (90.3 kg)   39.91 kg/m  Vitals:   03/11/23 1602  BP: 128/82  Pulse: 78  Temp: 97.9 F (36.6 C)  SpO2: 98%     Had exp to ants  Walking on a palate walk - last night / lost balance with her arms full  She sat down quickly to avoid falling , her legs were dangling  Felt something crawling on arms and legs  Bitten- both arms / R knee  Small ants - small brown ones /not fire ant   Has bites on arms and legs from last night Areas formed pustules   Very itchy spots now   No breathing problems Some whelps - worse as the day goes on   Uses witch hazel on them   Takes claritin in am   Allarest last night    Patient Active Problem List   Diagnosis Date Noted   Insect bites 03/11/2023   Acute upper respiratory infection 11/16/2022   Right foot injury, initial encounter 10/26/2022   Left foot pain 05/17/2022   Vision impairment 05/17/2022   Chronic diarrhea 04/20/2022   Physical deconditioning 01/31/2022   Hypokalemia 01/26/2022   Cellulitis of left lower extremity 01/25/2022   Mild cognitive impairment 01/25/2022   Venous stasis ulcer of right lower leg with edema of right lower leg (HCC) 11/16/2021   Low iron 10/01/2021   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 substance 09/13/2021   Schmorl's nodes of lumbar region 08/25/2021   Weight loss 07/05/2021   Chronic low back pain with right-sided sciatica 12/12/2020   Somnolence 04/12/2020   Positive ANA (antinuclear antibody) 12/01/2019   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   Peripheral neuropathy 10/14/2019   Pain due to onychomycosis of toenails of both feet 05/04/2019    Kidney cysts 10/08/2018   Liver cyst 10/08/2018   Calcium pyrophosphate arthropathy of multiple sites 08/20/2018   CTS (carpal tunnel syndrome) 07/13/2018   Skin rash 04/04/2018   Venous stasis ulcer of left lower leg with edema of left lower leg (HCC) 02/05/2018   Elbow stiffness, right 03/08/2017   Leg pain, bilateral 08/28/2016   Bilateral lower extremity edema 08/13/2016   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   Kyphosis of cervical region 07/02/2016   Intertrigo 03/22/2016   Cervical myelopathy with cervical radiculopathy (HCC) 08/18/2015   Varicose veins of both lower extremities with complications 05/24/2015   Advanced care planning/counseling discussion 09/22/2014   Medicare annual wellness visit, subsequent 05/15/2013   Allergic rhinitis 04/03/2013   Vitamin D deficiency 03/18/2013   Hypertension    Asthma in adult    Chronic venous insufficiency    Arthritis    OSA on CPAP    History of melanoma    Past Medical History:  Diagnosis Date   Allergy    Anesthesia complication    trouble waking up 2/2 CPAP   Arthritis    Asthma in adult    Cataract 2019   left eye resolved with surgery   Cervical spondylosis 2012   [place] [facility])   Choroidal nevus of left eye 03/22/2016  Chronic venous insufficiency 2016   severe reflux with painful varicosities sees [facility]   Clotting disorder (HCC)    due to vein ablation    Cognitive dysfunction    Complication of anesthesia    difficulty coming out due to sleep apnea per pt   Difficult intubation    PATIENT DENIES    Diverticulosis    per ct scans 09-2016 and 01-2017   DVT (deep venous thrombosis) (HCC) 2011   small, developed after venous ablation   DVT (deep venous thrombosis) (HCC)    Family history of adverse reaction to anesthesia    O2 levels drop upon waking    Hearing loss sensory, bilateral 2013   mod-severe high freq sensorineural ([facility])   Hiatal hernia     09-2016 ct scan HP at cancer center    History of  kidney stones 1980s   History of radiation therapy 07/19/11-08/25/2011   RIGHT AXILLARY REGION/METASTATIC   Hypertension    Melanoma (HCC) 06/29/2010   MALIGNANT MELANOMA R SHOULDER/SUPRASCAPULAR BACK s/p interferon chemo and XRT   Neuromuscular disorder (HCC)    muscle spasms   OSA (obstructive sleep apnea)    on CPAP   Pneumonia    2001   RLS (restless legs syndrome)    Seasonal allergies    Sleep apnea    wears cpap   Tinnitus    Unspecified vitamin D deficiency 03/18/2013   Varicose veins of left lower extremity    Past Surgical History:  Procedure Laterality Date   ABI  2016   WNL   ANTERIOR CERVICAL DECOMP/DISCECTOMY FUSION N/A 08/22/2015   ANTERIOR CERVICAL DECOMPRESSION FUSION C3/4 - interbody graft and anterior plate; Barnett Abu, MD   ANTERIOR CERVICAL DECOMP/DISCECTOMY FUSION N/A 07/02/2016   Procedure: Cervical four- five Cervical five- six Cervical six- seven Anterior cervical decompression/diskectomy/fusion;  Surgeon: Barnett Abu, MD;  Location: [facility];  Service: Neurosurgery;  Laterality: N/A;  C4-5 C5-6 C6-7 Anterior cervical decompression/diskectomy/fusion   BREAST BIOPSY Left 2013   BENIGN   BREAST SURGERY Right 1990   BX    CARDIOVASCULAR STRESS TEST  2010   normal stress test, EF 66%   CATARACT EXTRACTION W/ INTRAOCULAR LENS IMPLANT Left 2019   Dr. Dagoberto Ligas   COLONOSCOPY  03/2005   benign polyp, int hem, rpt 5 yrs ([place], [place])   COLONOSCOPY WITH PROPOFOL N/A 01/06/2018   diverticulosis, decreased sphincter tone, no f/u needed Sherrilyn Rist, MD)   CORONARY ULTRASOUND/IVUS N/A 08/03/2019   Maeola Harman, MD   DEEP AXILLARY SENTINEL NODE BIOPSY / EXCISION  2012   RIGHT   DEXA  06/2016   WNL   DILATION AND CURETTAGE OF UTERUS  2010   ENDOVENOUS ABLATION SAPHENOUS VEIN W/ LASER Left 2010   [facility]   ENDOVENOUS ABLATION SAPHENOUS VEIN W/ LASER Left 2016   accessory branch [facility])   ESOPHAGOGASTRODUODENOSCOPY  2006   mod  chronic carditis, no H pylori ([place], Sharol Given)   KNEE SURGERY  1985   left   LOWER EXTREMITY VENOGRAPHY Left 08/03/2019   Maeola Harman, MD   LUMBAR LAMINECTOMY  2001   L4/5   MELANOMA EXCISION  2011   Right shoulder, with sentinel lymph node biopsy   PERIPHERAL VASCULAR INTERVENTION Left 08/03/2019   Stent of left common iliac vein with 14 x 60 mm Vici Randie Heinz, Dennard Schaumann, MD)   [facility] REMOVAL  12/05/2011   Procedure: REMOVAL PORT-A-CATH;  Surgeon: Emelia Loron, MD;  Location: [facility];  Service: General;  Laterality: N/A;  left port removal   PORTACATH PLACEMENT     left subclavian   UPPER GASTROINTESTINAL ENDOSCOPY     Social History   Tobacco Use   Smoking status: Former    Types: Cigarettes    Quit date: 11/05/1965    Years since quitting: 57.3    Passive exposure: Never   Smokeless tobacco: Never   Tobacco comments:    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   Vaping Use: Former  Substance Use Topics   Alcohol use: No    Alcohol/week: 0.0 standard drinks of alcohol   Drug use: No   Family History  Problem Relation Age of Onset   Cancer Mother 40       uterine   CAD Father 33       MI   Hypertension Father 2   Alzheimer's disease Father 29   Heart attack Father    Diabetes Sister    Cancer Paternal Grandmother        melanoma, possibly   Cancer Cousin        x2, breast   Colon cancer Neg Hx    Colon polyps Neg Hx    Rectal cancer Neg Hx    Stomach cancer Neg Hx    Allergies  Allergen Reactions   Sulfa Antibiotics Itching, Swelling and Shortness Of Breath    Facial swelling   Voltaren [Diclofenac Sodium] Shortness Of Breath   Latex Other (See Comments)    Blisters ONLY HAD REACTION TO TAPE  (??? ONLY ADHESIVE ALLERGY)   Tape Other (See Comments)    Caused blisters - must use paper tape - same reaction from NeoPrene   Doxycycline Other (See Comments)    Diaphoresis and dizziness   Gabapentin Other (See Comments)     confusion, sleep a lot, and swelling in her legs   Other     Neoprene   Wound Dressing Adhesive Rash   Current Outpatient Medications on File Prior to Visit  Medication Sig Dispense Refill   acetaminophen (TYLENOL) 650 MG CR tablet Take 2 tablets (1,300 mg total) by mouth 2 (two) times daily as needed for pain.     albuterol (VENTOLIN HFA) 108 (90 Base) MCG/ACT inhaler INHALE 1-2 PUFFS BY MOUTH EVERY 6 HOURS AS NEEDED FOR WHEEZE OR SHORTNESS OF BREATH 8.5 each 0   aspirin EC 81 MG tablet Take 81 mg by mouth at bedtime. Melanoma prevention     furosemide (LASIX) 40 MG tablet Take 1 tablet (40 mg total) by mouth daily. Take second dose if needed for leg swelling/weight gain 100 tablet 4   loratadine (CLARITIN) 10 MG tablet Take 10 mg by mouth daily.     losartan (COZAAR) 50 MG tablet Take 1 tablet (50 mg total) by mouth daily. 90 tablet 4   montelukast (SINGULAIR) 10 MG tablet Take 1 tablet (10 mg total) by mouth daily. 90 tablet 4   Multiple Vitamin (MULTIVITAMIN WITH MINERALS) TABS tablet Take 1 tablet by mouth daily.     phenol (CHLORASEPTIC GARGLE) 1.4 % LIQD Use as directed 1 spray in the mouth or throat as needed for throat irritation / pain. 118 mL 0   Polyvinyl Alcohol-Povidone (TEARS PLUS OP) Place 1 drop into both eyes daily as needed (dry eyes/ redness/ burning).      potassium chloride SA (KLOR-CON M20) 20 MEQ tablet Take 1 tablet (20 mEq total) by mouth 2 (two) times  daily. 180 tablet 4   PRESCRIPTION MEDICATION CPAP     saccharomyces boulardii (FLORASTOR) 250 MG capsule Take 1 capsule (250 mg total) by mouth 2 (two) times daily.     triamcinolone (NASACORT) 55 MCG/ACT AERO nasal inhaler Place 2 sprays into the nose daily. In each nostril     Vitamin D, Ergocalciferol, (DRISDOL) 1.25 MG (50000 UNIT) CAPS capsule TAKE 1 CAPSULE BY MOUTH ONCE A WEEK 12 capsule 4   No current facility-administered medications on file prior to visit.    Review of Systems  Constitutional:   Positive for fatigue. Negative for activity change, appetite change, fever and unexpected weight change.  HENT:  Negative for congestion, ear pain, rhinorrhea, sinus pressure and sore throat.   Eyes:  Negative for pain, redness and visual disturbance.  Respiratory:  Negative for cough, shortness of breath and wheezing.   Cardiovascular:  Negative for chest pain and palpitations.  Gastrointestinal:  Negative for abdominal pain, blood in stool, constipation and diarrhea.  Endocrine: Negative for polydipsia and polyuria.  Genitourinary:  Negative for dysuria, frequency and urgency.  Musculoskeletal:  Negative for arthralgias, back pain and myalgias.  Skin:  Positive for rash and wound. Negative for pallor.  Allergic/Immunologic: Negative for environmental allergies.  Neurological:  Negative for dizziness, syncope and headaches.  Hematological:  Negative for adenopathy. Does not bruise/bleed easily.  Psychiatric/Behavioral:  Negative for decreased concentration and dysphoric mood. The patient is not nervous/anxious.        Objective:   Physical Exam Constitutional:      General: She is not in acute distress.    Appearance: Normal appearance. She is obese. She is ill-appearing.  HENT:     Head: Normocephalic and atraumatic.     Mouth/Throat:     Mouth: Mucous membranes are moist.     Comments: No mouth/ throat or facial swelling Eyes:     General:        Right eye: No discharge.        Left eye: No discharge.     Conjunctiva/sclera: Conjunctivae normal.     Pupils: Pupils are equal, round, and reactive to light.  Cardiovascular:     Rate and Rhythm: Normal rate and regular rhythm.  Pulmonary:     Effort: Pulmonary effort is normal. No respiratory distress.     Breath sounds: Normal breath sounds. No stridor. No wheezing, rhonchi or rales.  Musculoskeletal:     Cervical back: Neck supple.  Lymphadenopathy:     Cervical: No cervical adenopathy.  Skin:    Findings: Lesion present.      Comments: Insect bites on bilat forearms (worse on R) - resemble vesicles and pustules surrounded by whelps of erythema and induration  No excoriation  No skin breakdown  Neurological:     Mental Status: She is alert.     Cranial Nerves: No cranial nerve deficit.  Psychiatric:        Mood and Affect: Mood normal.           Assessment & Plan:   Problem List Items Addressed This Visit       Other   Insect bites - Primary    Bitten by brown small ants (not fire ants) on both legs and R knee last night  Bites resemble pustules and are surrounded by whelps Symptom of itching (no pain and no s/s of anaphylaxis)  Instructed to keep clean with soap /water Keep cool/ compresses prn Prednisone 20 mg daily for 5 days (thinks  she can tolerate low dose)-discussed side effects Update if not starting to improve in a week or if worsening  Antihistamine (allarest or benadryl) at bedtime with caution  Handout given ER precautions noted in detail

## 2023-03-11 NOTE — Telephone Encounter (Signed)
[facility] Night - Client TELEPHONE ADVICE RECORD Access[facility]urse Patient Name First: Signa Last: Null Gender: Female DOB: Dec 18, 1945 Age: 77 Y 8 M 2 D Return Phone Number: [phone] (Primary) Address: City/ State/ Zip: [place] [address] Client [facility] Night - Client Client Site [facility] - Night Provider Eustaquio Boyden - MD Contact Type Call Who Is Calling Patient / Member / Family / Caregiver Call Type Triage / Clinical Relationship To Patient Self Return Phone Number [phone] (Primary) Chief Complaint Insect Bite Reason for Call Symptomatic / Request for Health Information Initial Comment Caller states she was sitting on an ant hill by accident and was bitten. She is has swollen bites all over. They were not fire ants but they do itchy. She is wondering what she can put on it. Translation No  Nurse Assessment Nurse: Leonie Douglas, RN, Ashlynn Date/Time (Eastern Time): 03/10/2023 11:11:44 PM Confirm and document reason for call. If symptomatic, describe symptoms. ---Caller states she is has swollen bites all over both arms. They were not fire ants but they do itchy. She is wondering what she can put on it. Does the patient have any new or worsening symptoms? ---Yes Will a triage be completed? ---Yes Related visit to physician within the last 2 weeks? ---Yes Does the PT have any chronic conditions? (i.e. diabetes, asthma, this includes High risk factors for pregnancy, etc.) ---Yes List chronic conditions. ---venous stasis Is this a behavioral health or substance abuse call? ---No  Guidelines Guideline Title Affirmed Question Affirmed Notes Nurse Date/Time Lamount Cohen Time) Insect Bite Itchy insect bite [place], RN, Ashlynn 03/10/2023 11:13:16 PM Disp. Time Lamount Cohen Time) Disposition Final User 03/10/2023 11:21:50 PM Home Care Yes Leonie Douglas, RN, Ashlynn Final Disposition 03/10/2023 11:21:50 PM Home  Care Yes Leonie Douglas, RN, Ashlynn PLEASE NOTE: All timestamps contained within this report are represented as [place] Standard Time. CONFIDENTIALTY NOTICE: This fax transmission is intended only for the addressee. It contains information that is legally privileged, confidential or otherwise protected from use or disclosure. If you are not the intended recipient, you are strictly prohibited from reviewing, disclosing, copying using or disseminating any of this information or taking any action in reliance on or regarding this information. If you have received this fax in error, please notify us immediately by telephone so that we can arrange for its return to [place]. Phone: [phone], Toll-Free: [phone], Fax: [phone] Page: 2 of 2 Call Id: [id number] Caller Disagree/Comply Comply Caller Understands Yes PreDisposition Home Care Care Advice Given Per Guideline HOME CARE: * You should be able to treat this at home. REASSURANCE AND EDUCATION - INSECT BITE(S): * The most common symptom of an insect bite is LOCAL SKIN REACTION with a SMALL RED BUMP. THERE MAY BE many small red bumps if you were bitten multiple times. Other symptoms are: ITCHING, PAIN, small area of localized hives (a welt or slight swelling), or small area of localized redness. Sometimes a tiny water blister forms in the center of the insect bite. * Most insect bites can be managed at home with self-care (e.g., cold packs) and over-the-counter medicines (e.g., antihistamines, pain medicines). * Rub bite(s) with an ice cube for 30 seconds. FOUR SIMPLE HOME REMEDIES FOR ITCHY INSECT BITES: * Try putting firm, direct, steady pressure to a bite for 10 seconds. A fingernail, pen cap, or other object can be used. * Apply calamine lotion to the bite(s). DON'T SCRATCH: HYDROCORTISONE CREAM FOR SEVERE ITCHING: * You can use hydrocortisone  for severe itching. * Put 1% hydrocortisone cream on the itchy area(s) 3 times a day. Use it for a couple  days, until it feels better. This will help decrease the itching. * This is an over-the-counter (OTC) drug. You can buy it at the drugstore. ANTIHISTAMINE MEDICINES FOR SEVERE ITCHING: * For severe itching, you can take either cetirizine, fexofenadine, or loratadine. * They are over-thecounter (OTC) antihistamine medicines. You can buy them at a drugstore or grocery store. * CETIRIZINE (REACTINE, ZYRTEC): The adult dose is 10 mg. You take it once a day. Cetirizine is available in the [place] as Zyrtec and in [place] as Reactine. * FEXOFENADINE (ALLEGRA): In the [place], the adult dose is one 24-hour tablet (180 mg) once a day. In [place], the adult dose is one 24-hour tablet (120 mg) once a day. Or, you can take one 12-hour (60 mg) tablet 2 times a day. * LORATADINE (ALAVERT, CLARITIN): The adult dose is 10 mg. You take it once a day. Loratadine is available in the [place] as Programmer, systems; it is available in [place] as Claritin. CALAMINE LOTION FOR ITCHING: * Calamine lotion can be used for itching from insect bites and other minor skin irritation (such as poison ivy). * It is available over-the-counter (OTC). You can buy it at the drugstore. * Instructions: Shake the bottle before using. Put a small amount on the itchy area of skin. Let it dry. * Any pinkness or redness usually lasts 3 days. * Insect bites of the upper face can sometimes cause a lot of swelling around the eye, but this is harmless. * Most insect bites are itchy and puffy for several days. EXPECTED COURSE: CALL BACK IF: * Severe pain persists over 2 hours after pain medicine * Bite has not healed after 14 days * You become worse * Redness getting larger and more than 48 hours after the bite * Bite looks infected (pus, red streaks, increased tenderness) CARE ADVICE given per Insect Bite (Adult) guideline

## 2023-03-11 NOTE — Assessment & Plan Note (Addendum)
Bitten by brown small ants (not fire ants) on both legs and R knee last night  Bites resemble pustules and are surrounded by whelps Symptom of itching (no pain and no s/s of anaphylaxis)  Instructed to keep clean with soap /water Keep cool/ compresses prn Prednisone 20 mg daily for 5 days (thinks she can tolerate low dose)-discussed side effects Update if not starting to improve in a week or if worsening  Antihistamine (allarest or benadryl) at bedtime with caution  Handout given ER precautions noted in detail

## 2023-03-11 NOTE — Telephone Encounter (Signed)
I spoke with pt; pt tripped and fell on sidewalk at home and fell on ant hill;regular small tan ants stung pt several times on arms and once on her knee; pt has pustules on both arms with some swelling. No difficulty breathing and no swelling in face, mouth, tongue,neck or throat;pt has taken claritin that helps itching for brief period. pt has appt to see Dr Milinda Antis 03/11/23 at 4 PM with UC & ED precautions given and pt voiced understanding. Sending note to Dr Milinda Antis and [employer].

## 2023-03-11 NOTE — Patient Instructions (Addendum)
You are allergic to these ant bites   Keep these clean with soap and water  Try not to scratch  Stay cool     Antihistamine  Claritin is ok daily   Allarest is ok at night (or benadryl) - caution of sedation   Take prednisone 20 mg daily for 5 days (can make you hyper and hungry)  Any severe side effects stop it and let us know   If you develop any trouble breathing or swelling of mouth/tongue/throat - go to ER   Update if not starting to improve in a week or if worsening

## 2023-03-11 NOTE — Telephone Encounter (Signed)
Unable to reach pt by phone and left v/m requesting pt to cb with update on how pt is doing. Sending note to Dr Sharen Hones and [facility]aren Hones pool.

## 2023-03-13 ENCOUNTER — Encounter (HOSPITAL_BASED_OUTPATIENT_CLINIC_OR_DEPARTMENT_OTHER): Payer: Medicare Other | Admitting: General Surgery

## 2023-03-13 DIAGNOSIS — R600 Localized edema: Secondary | ICD-10-CM | POA: Diagnosis not present

## 2023-03-13 DIAGNOSIS — I872 Venous insufficiency (chronic) (peripheral): Secondary | ICD-10-CM | POA: Diagnosis not present

## 2023-03-13 DIAGNOSIS — L97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d: Secondary | ICD-10-CM | POA: Diagnosis not present

## 2023-03-13 DIAGNOSIS — L03115 Cellulitis of right lower limb: Secondary | ICD-10-CM | POA: Diagnosis not present

## 2023-03-13 DIAGNOSIS — L97818 Non-pressure chronic ulcer of other part of right lower leg with other specified severity: Secondary | ICD-10-CM | POA: Diagnosis not present

## 2023-03-13 DIAGNOSIS — L97222 Non-pressure chronic ulcer of left calf with fat layer exposed: Secondary | ICD-10-CM | POA: Diagnosis not present

## 2023-03-13 DIAGNOSIS — S80811A Abrasion, right lower leg, initial encounter: Secondary | ICD-10-CM | POA: Diagnosis not present

## 2023-03-13 DIAGNOSIS — I83893 Varicose veins of bilateral lower extremities with other complications: Secondary | ICD-10-CM | POA: Diagnosis not present

## 2023-03-13 DIAGNOSIS — S90511A Abrasion, right ankle, initial encounter: Secondary | ICD-10-CM | POA: Diagnosis not present

## 2023-03-25 ENCOUNTER — Encounter (HOSPITAL_BASED_OUTPATIENT_CLINIC_OR_DEPARTMENT_OTHER): Payer: Medicare Other | Admitting: General Surgery

## 2023-03-25 DIAGNOSIS — L03115 Cellulitis of right lower limb: Secondary | ICD-10-CM | POA: Diagnosis not present

## 2023-03-25 DIAGNOSIS — L97818 Non-pressure chronic ulcer of other part of right lower leg with other specified severity: Secondary | ICD-10-CM | POA: Diagnosis not present

## 2023-03-25 DIAGNOSIS — L97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d: Secondary | ICD-10-CM | POA: Diagnosis not present

## 2023-03-25 DIAGNOSIS — L97222 Non-pressure chronic ulcer of left calf with fat layer exposed: Secondary | ICD-10-CM | POA: Diagnosis not present

## 2023-03-25 DIAGNOSIS — I872 Venous insufficiency (chronic) (peripheral): Secondary | ICD-10-CM | POA: Diagnosis not present

## 2023-03-25 DIAGNOSIS — R600 Localized edema: Secondary | ICD-10-CM | POA: Diagnosis not present

## 2023-03-25 DIAGNOSIS — I83893 Varicose veins of bilateral lower extremities with other complications: Secondary | ICD-10-CM | POA: Diagnosis not present

## 2023-04-03 ENCOUNTER — Encounter (HOSPITAL_BASED_OUTPATIENT_CLINIC_OR_DEPARTMENT_OTHER): Payer: Medicare Other | Admitting: General Surgery

## 2023-04-03 DIAGNOSIS — L97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d: Secondary | ICD-10-CM | POA: Diagnosis not present

## 2023-04-03 DIAGNOSIS — L97818 Non-pressure chronic ulcer of other part of right lower leg with other specified severity: Secondary | ICD-10-CM | POA: Diagnosis not present

## 2023-04-03 DIAGNOSIS — L97222 Non-pressure chronic ulcer of left calf with fat layer exposed: Secondary | ICD-10-CM | POA: Diagnosis not present

## 2023-04-03 DIAGNOSIS — L03115 Cellulitis of right lower limb: Secondary | ICD-10-CM | POA: Diagnosis not present

## 2023-04-03 DIAGNOSIS — I83893 Varicose veins of bilateral lower extremities with other complications: Secondary | ICD-10-CM | POA: Diagnosis not present

## 2023-04-03 DIAGNOSIS — R600 Localized edema: Secondary | ICD-10-CM | POA: Diagnosis not present

## 2023-04-03 DIAGNOSIS — I872 Venous insufficiency (chronic) (peripheral): Secondary | ICD-10-CM | POA: Diagnosis not present

## 2023-04-10 ENCOUNTER — Encounter (HOSPITAL_BASED_OUTPATIENT_CLINIC_OR_DEPARTMENT_OTHER): Payer: Medicare Other | Attending: General Surgery | Admitting: General Surgery

## 2023-04-10 DIAGNOSIS — I872 Venous insufficiency (chronic) (peripheral): Secondary | ICD-10-CM | POA: Insufficient documentation

## 2023-04-10 DIAGNOSIS — G629 Polyneuropathy, unspecified: Secondary | ICD-10-CM | POA: Insufficient documentation

## 2023-04-10 DIAGNOSIS — L03115 Cellulitis of right lower limb: Secondary | ICD-10-CM | POA: Diagnosis not present

## 2023-04-10 DIAGNOSIS — R600 Localized edema: Secondary | ICD-10-CM | POA: Insufficient documentation

## 2023-04-10 DIAGNOSIS — L97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d: Secondary | ICD-10-CM | POA: Diagnosis not present

## 2023-04-10 DIAGNOSIS — L97222 Non-pressure chronic ulcer of left calf with fat layer exposed: Secondary | ICD-10-CM | POA: Diagnosis not present

## 2023-04-10 DIAGNOSIS — I83893 Varicose veins of bilateral lower extremities with other complications: Secondary | ICD-10-CM | POA: Insufficient documentation

## 2023-04-16 NOTE — Progress Notes (Signed)
[name] ([id number]) [id number] Page 1 of 9 Visit Report for 03/06/2023 Arrival Information Details Patient Name: Date of Service: [name] 03/06/2023 2:00 PM Medical Record Number: [id number] Patient Account Number: [id number] Date of Birth/Sex: Treating [name]: Apr 02, 1946 (77 y.o. F) Primary Care [name]: [name] Other Clinician: Referring [name]: Treating [name] in Treatment: 31 Visit Information History Since Last Visit All ordered tests and consults were completed: No Patient Arrived: Ambulatory Added or deleted any medications: No Arrival Time: 13:49 Any new allergies or adverse reactions: No Accompanied By: self Had a fall or experienced change in No Transfer Assistance: None activities of daily living that may affect Patient Identification Verified: Yes risk of falls: Secondary Verification Process Completed: Yes Signs or symptoms of abuse/neglect since last visito No Patient Requires Transmission-Based Precautions: No Hospitalized since last visit: No Patient Has Alerts: No Implantable device outside of the clinic excluding No cellular tissue based products placed in the center since last visit: Pain Present Now: No Electronic Signature(s) Signed: 03/06/2023 3:25:20 PM By: [name] Entered By: [name] on 03/06/2023 13:49:32 -------------------------------------------------------------------------------- Compression Therapy Details Patient Name: Date of Service: [name] 03/06/2023 2:00 PM Medical Record Number: [id number] Patient Account Number: [id number] Date of Birth/Sex: Treating [name]: November 19, 1945 (77 y.o. [name] Primary Care Lena Fieldhouse: [name] Other Clinician: Referring Lasya Vetter: Treating Sheyanne Munley/Extender: [name] Weeks in Treatment: 31 Compression Therapy Performed for Wound Assessment: Wound #2  Left,Posterior Lower Leg Performed By: [name] Compression Type: Three Layer Post Procedure Diagnosis Same as Pre-procedure Electronic Signature(s) Signed: 04/16/2023 7:49:05 AM By: [name] Entered By: [name] on 03/06/2023 16:04:54 -------------------------------------------------------------------------------- Compression Therapy Details Patient Name: Date of Service: [name] 03/06/2023 2:00 PM Medical Record Number: [id number] Patient Account Number: [id number] Date of Birth/Sex: Treating [name]: August 31, 1946 (77 y.o. [name] Primary Care Demetris Capell: [name] Other Clinician: Referring Honestie Kulik: Treating Marv Alfrey/Extender: [name] Weeks in Treatment: 31 Compression Therapy Performed for Wound Assessment: Wound #4 Right,Anterior Lower Leg Performed By: [name] Compression Type: Three Layer Post Procedure Diagnosis Same as Pre-procedure [name] ([id number]) [id number] Page 2 of 9 Electronic Signature(s) Signed: 04/16/2023 7:49:05 AM By: [name] Entered By: [name] on 03/06/2023 16:04:54 -------------------------------------------------------------------------------- Compression Therapy Details Patient Name: Date of Service: [name] 03/06/2023 2:00 PM Medical Record Number: [id number] Patient Account Number: [id number] Date of Birth/Sex: Treating [name]: Dec 28, 1945 (77 y.o. [name] Primary Care Carr Shartzer: [name] Other Clinician: Referring Eyva Califano: Treating Lavayah Vita/Extender: [name] Weeks in Treatment: 31 Compression Therapy Performed for Wound Assessment: Wound #5 Right,Lateral Ankle Performed By: [name] Compression Type: Three Layer Post Procedure Diagnosis Same as Pre-procedure Electronic Signature(s) Signed: 04/16/2023 7:49:05 AM By: [name] Entered  By: [name] on 03/06/2023 16:04:54 -------------------------------------------------------------------------------- Encounter Discharge Information Details Patient Name: Date of Service: [name] 03/06/2023 2:00 PM Medical Record Number: [id number] Patient Account Number: [id number] Date of Birth/Sex: Treating [name]: 10-Mar-1946 (77 y.o. [name] Primary Care Milisa Kimbell: [name] Other Clinician: Referring [name]: Treating [name] in Treatment: 31 Encounter Discharge Information Items Post Procedure Vitals Discharge Condition: Stable Temperature (F): 98.5 Ambulatory Status: Ambulatory Pulse (bpm): 80 Discharge Destination: Home Respiratory Rate (breaths/min): 18 Transportation: Private Auto Blood Pressure (mmHg): 136/72 Accompanied By: self Schedule Follow-up Appointment: Yes Clinical Summary of Care: Patient Declined Electronic Signature(s) Signed: 04/16/2023 7:49:05 AM By: [name] Entered By:  [name] on 03/06/2023 15:13:39 -------------------------------------------------------------------------------- Lower Extremity Assessment Details Patient Name: Date of Service: [name] 03/06/2023 2:00 PM Medical Record Number: [id number] Patient Account Number: [id number] Date of Birth/Sex: Treating [name]: Dec 06, 1945 (77 y.o. [name] Primary Care Larin Depaoli: [name] Other Clinician: Referring Zackarey Holleman: Treating Luisana Lutzke/Extender: [name] Weeks in Treatment: 31 Edema Assessment Assessed: [name]: No] [name]: No] Edema: [Left: Yes] [Right: Yes] Calf Left: Right: Point of Measurement: From Medial Instep 43.4 cm 46 cm [name], [name] ([id number]) [id number].pdf Page 3 of 9 Ankle Left: Right: Point of Measurement: From Medial Instep 24.3 cm 26 cm Vascular Assessment Pulses: Dorsalis Pedis Palpable: [Left:Yes]  [Right:Yes] Electronic Signature(s) Signed: 04/16/2023 7:49:05 AM By: [name] Entered By: [name] on 03/06/2023 14:23:11 -------------------------------------------------------------------------------- Multi Wound Chart Details Patient Name: Date of Service: Amber Buffy. 03/06/2023 2:00 PM Medical Record Number: [id number] Patient Account Number: [id number] Date of Birth/Sex: Treating [name]: 12-27-1945 (77 y.o. F) Primary Care Argusta Mcgann: [name] Other Clinician: Referring Iley Deignan: Treating Zorian Gunderman/Extender: [name] in Treatment: 31 Vital Signs Height(in): 63 Pulse(bpm): 71 Weight(lbs): 200 Blood Pressure(mmHg): 148/72 Body Mass Index(BMI): 35.4 Temperature(F): 97.9 Respiratory Rate(breaths/min): 20 [2:Photos:] [4:No Photos] Left, Posterior Lower Leg Right, Anterior Lower Leg Right, Lateral Ankle Wound Location: Blister Trauma Trauma Wounding Event: Venous Leg Ulcer Abrasion Abrasion Primary Etiology: Cataracts, Asthma, Hypertension, Cataracts, Asthma, Hypertension, Cataracts, Asthma, Hypertension, Comorbid History: Neuropathy, Received Chemotherapy, Neuropathy, Received Chemotherapy, Neuropathy, Received Chemotherapy, Received Radiation Received Radiation Received Radiation 07/06/2022 02/28/2023 02/28/2023 Date Acquired: 31 0 0 Weeks of Treatment: Open Open Open Wound Status: No No No Wound Recurrence: 3.5x2x0.1 0.3x0.2x0.1 0.1x0.1x0.1 Measurements L x W x D (cm) 5.498 0.047 0.008 A (cm) : rea 0.55 0.005 0.001 Volume (cm) : 20.00% 33.80% 0.00% % Reduction in Area: 19.90% 28.60% 0.00% % Reduction in Volume: Full Thickness Without Exposed Full Thickness Without Exposed Full Thickness Without Exposed Classification: Support Structures Support Structures Support Structures Medium Small Small Exudate A mount: Serosanguineous Sanguinous Serous Exudate Type: red, brown red Amber Exudate Color: Distinct, outline  attached N/A N/A Wound Margin: Medium (34-66%) Large (67-100%) Large (67-100%) Granulation Amount: Red, Pink, Hyper-granulation Red, Hyper-granulation Pink Granulation Quality: Medium (34-66%) None Present (0%) Small (1-33%) Necrotic Amount: Adherent Slough N/A Eschar, Adherent Slough Necrotic Tissue: Fat Layer (Subcutaneous Tissue): Yes Fat Layer (Subcutaneous Tissue): Yes Fat Layer (Subcutaneous Tissue): Yes Exposed Structures: Fascia: No Fascia: No Fascia: No Tendon: No Tendon: No Tendon: No Muscle: No Muscle: No Muscle: No Joint: No Joint: No Joint: No Bone: No Bone: No Bone: No VONTELLA, PATSY ([id number]) [id number] Page 4 of 9 Medium (34-66%) Small (1-33%) None Epithelialization: Debridement - Selective/Open Wound N/A N/A Debridement: 14:34 N/A N/A Pre-procedure Verification/Time Out Taken: Lidocaine 4% Topical Solution N/A N/A Pain Control: Slough N/A N/A Tissue Debrided: Non-Viable Tissue N/A N/A Level: 5.5 N/A N/A Debridement A (sq cm): rea Curette N/A N/A Instrument: Minimum N/A N/A Bleeding: Pressure N/A N/A Hemostasis A chieved: 3 N/A N/A Procedural Pain: 3 N/A N/A Post Procedural Pain: Procedure was tolerated well N/A N/A Debridement Treatment Response: 3.5x2x0.1 N/A N/A Post Debridement Measurements L x W x D (cm) 0.55 N/A N/A Post Debridement Volume: (cm) Scarring: Yes Excoriation: No No Abnormalities Noted Periwound Skin Texture: Induration: No Callus: No Crepitus: No Rash: No Scarring: No No Abnormalities Noted Maceration: No No Abnormalities Noted Periwound Skin Moisture: Dry/Scaly: No Rubor: No Hemosiderin Staining: Yes No Abnormalities Noted Periwound Skin Color: Atrophie Blanche: No Cyanosis: No Ecchymosis: No Erythema: No  Mottled: No Pallor: No Rubor: No No Abnormality No Abnormality No Abnormality Temperature: Yes N/A N/A Tenderness on Palpation: Debridement N/A N/A Procedures  Performed: Treatment Notes Electronic Signature(s) Signed: 03/06/2023 2:59:44 PM By: Duanne Guess MD FACS Entered By: Duanne Guess on 03/06/2023 14:59:44 -------------------------------------------------------------------------------- Multi-Disciplinary Care Plan Details Patient Name: Date of Service: [name] 03/06/2023 2:00 PM Medical Record Number: [id number] Patient Account Number: [id number] Date of Birth/Sex: Treating [name]: 1946/09/21 (77 y.o. [name] Primary Care Marykathleen Russi: [name] Other Clinician: Referring Shyana Kulakowski: Treating Nimsi Males/Extender: [name] in Treatment: 31 Multidisciplinary Care Plan reviewed with physician Active Inactive Venous Leg Ulcer Nursing Diagnoses: Knowledge deficit related to disease process and management Potential for venous Insuffiency (use before diagnosis confirmed) Goals: Patient will maintain optimal edema control Date Initiated: 02/07/2023 Target Resolution Date: 07/05/2023 Goal Status: Active Interventions: Assess peripheral edema status every visit. Compression as ordered Treatment Activities: Therapeutic compression applied : 02/07/2023 Notes: AREIANA, TAVES ([id number]) [phone] Page 5 of 9 Wound/Skin Impairment Nursing Diagnoses: Impaired tissue integrity Goals: Ulcer/skin breakdown will have a volume reduction of 30% by week 4 Date Initiated: 08/03/2022 Date Inactivated: 09/10/2022 Target Resolution Date: 08/31/2022 Unmet Reason: uncontrolled edema, Goal Status: Unmet acute infection Ulcer/skin breakdown will have a volume reduction of 50% by week 8 Date Initiated: 09/10/2022 Target Resolution Date: 07/05/2023 Goal Status: Active Interventions: Assess ulceration(s) every visit Treatment Activities: Skin care regimen initiated : 08/03/2022 Notes: [place]stone ordered. 10/30 RX Levo started. 09/10/22 Electronic Signature(s) Signed: 04/16/2023  7:49:05 AM By: [name] Entered By: [name] on 03/06/2023 14:22:06 -------------------------------------------------------------------------------- Pain Assessment Details Patient Name: Date of Service: [name] 03/06/2023 2:00 PM Medical Record Number: [id number] Patient Account Number: [id number] Date of Birth/Sex: Treating [name]: May 07, 1946 (77 y.o. F) Primary Care Avigail Pilling: [name] Other Clinician: Referring Kajal Scalici: Treating [name] Garritano/Extender: [name] in Treatment: 31 Active Problems Location of Pain Severity and Description of Pain Patient Has Paino No Site Locations Pain Management and Medication Current Pain Management: Electronic Signature(s) Signed: 03/06/2023 3:25:20 PM By: [name] Entered By: [name] on 03/06/2023 13:49:59 Amundson, Demiah J ([id number]) [id number] Page 6 of 9 -------------------------------------------------------------------------------- Patient/Caregiver Education Details Patient Name: Date of Service: [name] 5/1/2024andnbsp2:00 PM Medical Record Number: [id number] Patient Account Number: [id number] Date of Birth/Gender: Treating [name]: [name] 27, 1946 (77 y.o. [name] Primary Care Physician: [name] Other Clinician: Referring Physician: Treating Physician/Extender: [name] in Treatment: 31 Education Assessment Education Provided To: Patient Education Topics Provided Wound/Skin Impairment: Methods: Explain/Verbal Responses: State content correctly Electronic Signature(s) Signed: 04/16/2023 7:49:05 AM By: [name] Entered By: [name] on 03/06/2023 14:23:47 -------------------------------------------------------------------------------- Wound Assessment Details Patient Name: Date of Service: [name] 03/06/2023 2:00 PM Medical Record Number: [id number] Patient Account Number:  [id number] Date of Birth/Sex: Treating [name]: 05-19-1946 (77 y.o. F) Primary Care Kalynne Womac: [name] Other Clinician: Referring Bryce Cheever: Treating Shaquan Missey/Extender: [name] in Treatment: 31 Wound Status Wound Number: 2 Primary Venous Leg Ulcer Etiology: Wound Location: Left, Posterior Lower Leg Wound Open Wounding Event: Blister Status: Date Acquired: 07/06/2022 Comorbid Cataracts, Asthma, Hypertension, Neuropathy, Received Weeks Of Treatment: 31 History: Chemotherapy, Received Radiation Clustered Wound: No Photos Wound Measurements Length: (cm) 3.5 Width: (cm) 2 Depth: (cm) 0.1 Area: (cm) 5.498 Volume: (cm) 0.55 % Reduction in Area: 20% % Reduction in Volume: 19.9% Epithelialization: Medium (34-66%) Wound Description Classification: Full Thickness Without Exposed Support Structures Wound Margin: Distinct, outline attached Exudate Amount: Medium Exudate Type: Serosanguineous  Exudate Color: red, brown CONSTANZA, MENEAR ([id number]) Foul Odor After Cleansing: No Slough/Fibrino Yes [id number].pdf Page 7 of 9 Wound Bed Granulation Amount: Medium (34-66%) Exposed Structure Granulation Quality: Red, Pink, Hyper-granulation Fascia Exposed: No Necrotic Amount: Medium (34-66%) Fat Layer (Subcutaneous Tissue) Exposed: Yes Necrotic Quality: Adherent Slough Tendon Exposed: No Muscle Exposed: No Joint Exposed: No Bone Exposed: No Periwound Skin Texture Texture Color No Abnormalities Noted: Yes No Abnormalities Noted: Yes Moisture Temperature / Pain No Abnormalities Noted: Yes Temperature: No Abnormality Tenderness on Palpation: Yes Electronic Signature(s) Signed: 03/06/2023 3:25:20 PM By: [name] Entered By: [name] on 03/06/2023 14:03:04 -------------------------------------------------------------------------------- Wound Assessment Details Patient Name: Date of Service: [name] 03/06/2023 2:00  PM Medical Record Number: [id number] Patient Account Number: [id number] Date of Birth/Sex: Treating [name]: 15-Apr-1946 (77 y.o. F) Primary Care Cal Gindlesperger: [name] Other Clinician: Referring Laurena Valko: Treating Kenyia Wambolt/Extender: [name] in Treatment: 31 Wound Status Wound Number: 4 Primary Abrasion Etiology: Wound Location: Right, Anterior Lower Leg Wound Open Wounding Event: Trauma Status: Date Acquired: 02/28/2023 Comorbid Cataracts, Asthma, Hypertension, Neuropathy, Received Weeks Of Treatment: 0 History: Chemotherapy, Received Radiation Clustered Wound: No Wound Measurements Length: (cm) 0.3 Width: (cm) 0.2 Depth: (cm) 0.1 Area: (cm) 0.047 Volume: (cm) 0.005 % Reduction in Area: 33.8% % Reduction in Volume: 28.6% Epithelialization: Small (1-33%) Wound Description Classification: Full Thickness Without Exposed Support Structures Exudate Amount: Small Exudate Type: Sanguinous Exudate Color: red Foul Odor After Cleansing: No Slough/Fibrino No Wound Bed Granulation Amount: Large (67-100%) Exposed Structure Granulation Quality: Red, Hyper-granulation Fascia Exposed: No Necrotic Amount: None Present (0%) Fat Layer (Subcutaneous Tissue) Exposed: Yes Tendon Exposed: No Muscle Exposed: No Joint Exposed: No Bone Exposed: No Periwound Skin Texture Texture Color No Abnormalities Noted: Yes No Abnormalities Noted: No Atrophie Blanche: No Moisture Cyanosis: No No Abnormalities Noted: Yes Ecchymosis: No Erythema: No Hemosiderin Staining: Yes Mottled: No Pallor: No [name], BIGG ([id number]) [id number] Page 8 of 9 Rubor: No Temperature / Pain Temperature: No Abnormality Electronic Signature(s) Signed: 03/06/2023 3:25:20 PM By: [name] Entered By: [name] on 03/06/2023 14:03:26 -------------------------------------------------------------------------------- Wound Assessment Details Patient Name:  Date of Service: CYNTHIA, ZINGARELLI 03/06/2023 2:00 PM Medical Record Number: [id number] Patient Account Number: [id number] Date of Birth/Sex: Treating [name]: 1946-09-26 (77 y.o. F) Primary Care Kamrie Fanton: [name] Other Clinician: Referring Lavell Supple: Treating Rawlins Stuard/Extender: [name] in Treatment: 31 Wound Status Wound Number: 5 Primary Abrasion Etiology: Wound Location: Right, Lateral Ankle Wound Open Wounding Event: Trauma Status: Date Acquired: 02/28/2023 Comorbid Cataracts, Asthma, Hypertension, Neuropathy, Received Weeks Of Treatment: 0 History: Chemotherapy, Received Radiation Clustered Wound: No Photos Wound Measurements Length: (cm) 0.1 Width: (cm) 0.1 Depth: (cm) 0.1 Area: (cm) 0.008 Volume: (cm) 0.001 % Reduction in Area: 0% % Reduction in Volume: 0% Epithelialization: None Wound Description Classification: Full Thickness Without Exposed Suppor Exudate Amount: Small Exudate Type: Serous Exudate Color: Amber t Structures Foul Odor After Cleansing: No Slough/Fibrino Yes Wound Bed Granulation Amount: Large (67-100%) Exposed Structure Granulation Quality: Pink Fascia Exposed: No Necrotic Amount: Small (1-33%) Fat Layer (Subcutaneous Tissue) Exposed: Yes Necrotic Quality: Eschar, Adherent Slough Tendon Exposed: No Muscle Exposed: No Joint Exposed: No Bone Exposed: No Periwound Skin Texture Texture Color No Abnormalities Noted: Yes No Abnormalities Noted: Yes Moisture Temperature / Pain No Abnormalities Noted: Yes Temperature: No Abnormality Electronic Signature(sRAVEENA, DOERFLER ([id number]) [id number] Page 9 of 9 Signed: 03/06/2023 3:25:20 PM By: [name] Entered By: [name] on 03/06/2023 14:04:16 -------------------------------------------------------------------------------- Vitals Details Patient Name:  Date of Service: KAYLIANA, CODD 03/06/2023 2:00 PM Medical Record Number:  [id number] Patient Account Number: [id number] Date of Birth/Sex: Treating [name]: 10/29/1946 (77 y.o. F) Primary Care Riggin Cuttino: [name] Other Clinician: Referring Glena Pharris: Treating Raphael Espe/Extender: [name] in Treatment: 31 Vital Signs Time Taken: 01:49 Temperature (F): 97.9 Height (in): 63 Pulse (bpm): 71 Weight (lbs): 200 Respiratory Rate (breaths/min): 20 Body Mass Index (BMI): 35.4 Blood Pressure (mmHg): 148/72 Reference Range: 80 - 120 mg / dl Electronic Signature(s) Signed: 03/06/2023 3:25:20 PM By: [name] Entered By: [name] on 03/06/2023 13:49:52

## 2023-04-16 NOTE — Progress Notes (Signed)
[name], [name] ([id number]) [id number] Page 1 of 9 Visit Report for 03/13/2023 Arrival Information Details Patient Name: Date of Service: [name], [name] 03/13/2023 2:00 PM Medical Record Number: [id number] Patient Account Number: [id number] Date of Birth/Sex: Treating RN: Apr 06, 1946 (77 y.o. [name] Primary Care Calem Cocozza: Eustaquio Boyden Other Clinician: Referring Laurieann Friddle: Treating Joffre Lucks/Extender: Priscille Loveless in Treatment: 32 Visit Information History Since Last Visit All ordered tests and consults were completed: Yes Patient Arrived: Ambulatory Added or deleted any medications: No Arrival Time: 13:48 Any new allergies or adverse reactions: No Accompanied By: self Had a fall or experienced change in No Transfer Assistance: None activities of daily living that may affect Patient Identification Verified: Yes risk of falls: Secondary Verification Process Completed: Yes Signs or symptoms of abuse/neglect since last visito No Patient Requires Transmission-Based Precautions: No Hospitalized since last visit: No Patient Has Alerts: No Implantable device outside of the clinic excluding No cellular tissue based products placed in the center since last visit: Has Dressing in Place as Prescribed: Yes Pain Present Now: No Electronic Signature(s) Signed: 04/16/2023 7:49:05 AM By: Brenton Grills Entered By: Brenton Grills on 03/13/2023 13:54:13 -------------------------------------------------------------------------------- Encounter Discharge Information Details Patient Name: Date of Service: Amber Buffy. 03/13/2023 2:00 PM Medical Record Number: [id number] Patient Account Number: [id number] Date of Birth/Sex: Treating RN: 1946/07/05 (77 y.o. [name] Primary Care Ladan Vanderzanden: Eustaquio Boyden Other Clinician: Referring Josefita Weissmann: Treating Hser Belanger/Extender: Priscille Loveless in  Treatment: 32 Encounter Discharge Information Items Post Procedure Vitals Discharge Condition: Stable Temperature (F): 98.2 Ambulatory Status: Ambulatory Pulse (bpm): 78 Discharge Destination: Home Respiratory Rate (breaths/min): 18 Transportation: Private Auto Blood Pressure (mmHg): 136/78 Accompanied By: self Schedule Follow-up Appointment: Yes Clinical Summary of Care: Patient Declined Electronic Signature(s) Signed: 04/16/2023 7:49:05 AM By: Brenton Grills Entered By: Brenton Grills on 03/13/2023 14:59:36 -------------------------------------------------------------------------------- Lower Extremity Assessment Details Patient Name: Date of Service: [name], [name] 03/13/2023 2:00 PM Medical Record Number: [id number] Patient Account Number: [id number] Date of Birth/Sex: Treating RN: February 06, 1946 (77 y.o. [name] Primary Care Ashar Lewinski: Eustaquio Boyden Other Clinician: Referring Vyom Brass: Treating Jahvier Aldea/Extender: Katharina Caper Weeks in Treatment: 32 Edema Assessment P[Left: [name] ([id number])] Franne Forts: [id number].pdf Page 2 of 9] Assessed: [Left: No] [Right: No] Edema: [Left: Yes] [Right: Yes] Calf Left: Right: Point of Measurement: From Medial Instep 39 cm 39.4 cm Ankle Left: Right: Point of Measurement: From Medial Instep 23.2 cm 23.4 cm Vascular Assessment Pulses: Dorsalis Pedis Palpable: [Left:Yes] [Right:Yes] Electronic Signature(s) Signed: 04/16/2023 7:49:05 AM By: Brenton Grills Entered By: Brenton Grills on 03/13/2023 14:13:10 -------------------------------------------------------------------------------- Multi Wound Chart Details Patient Name: Date of Service: Amber Buffy. 03/13/2023 2:00 PM Medical Record Number: [id number] Patient Account Number: [id number] Date of Birth/Sex: Treating RN: [birth year] (77 y.o. F) Primary Care Arney Mayabb: Eustaquio Boyden Other Clinician: Referring  Shayda Kalka: Treating Keelia Graybill/Extender: Priscille Loveless in Treatment: 32 Vital Signs Height(in): 63 Pulse(bpm): 73 Weight(lbs): 200 Blood Pressure(mmHg): 147/81 Body Mass Index(BMI): 35.4 Temperature(F): 98.5 Respiratory Rate(breaths/min): 18 [2:Photos:] Left, Posterior Lower Leg Right, Anterior Lower Leg Right, Lateral Ankle Wound Location: Blister Trauma Trauma Wounding Event: Venous Leg Ulcer Abrasion Abrasion Primary Etiology: Cataracts, Asthma, Hypertension, Cataracts, Asthma, Hypertension, Cataracts, Asthma, Hypertension, Comorbid History: Neuropathy, Received Chemotherapy, Neuropathy, Received Chemotherapy, Neuropathy, Received Chemotherapy, Received Radiation Received Radiation Received Radiation 07/06/2022 02/28/2023 02/28/2023 Date Acquired: 32 1 1 Weeks of Treatment: Open Open Open Wound Status: No No No Wound Recurrence: 3.5x2x0.1 0.3x0.2x0.1 0.1x0.1x0.1 Measurements L x W x D (cm)  5.498 0.047 0.008 A (cm) : rea 0.55 0.005 0.001 Volume (cm) : 20.00% 33.80% 0.00% % Reduction in Area: 19.90% 28.60% 0.00% % Reduction in Volume: Full Thickness Without Exposed Full Thickness Without Exposed Full Thickness Without Exposed Classification: Support Structures Support Structures Support Structures Medium Small Small Exudate Amount: Serosanguineous Sanguinous Serous Exudate Type: red, brown red Amber Exudate Color: Distinct, outline attached N/A N/A Wound Margin: Medium (34-66%) Large (67-100%) Large (67-100%) Granulation Amount: Red, Pink, Hyper-granulation Red, Hyper-granulation Pink Granulation Quality: Medium (34-66%) None Present (0%) Small (1-33%) Necrotic Amount: [name], [name] ([id number]) [phone].pdf Page 3 of 9 Adherent Slough N/A Eschar, Adherent Slough Necrotic Tissue: Fat Layer (Subcutaneous Tissue): Yes Fat Layer (Subcutaneous Tissue): Yes Fat Layer (Subcutaneous Tissue): Yes Exposed  Structures: Fascia: No Fascia: No Fascia: No Tendon: No Tendon: No Tendon: No Muscle: No Muscle: No Muscle: No Joint: No Joint: No Joint: No Bone: No Bone: No Bone: No Medium (34-66%) Small (1-33%) None Epithelialization: Debridement - Selective/Open Wound Debridement - Selective/Open Wound Debridement - Selective/Open Wound Debridement: Pre-procedure Verification/Time Out 14:32 14:32 14:32 Taken: Lidocaine 4% Topical Solution Lidocaine 4% Topical Solution Lidocaine 4% Topical Solution Pain Control: Ambulance person, Ambulance person, [employer] Tissue Debrided: Non-Viable Tissue Non-Viable Tissue Non-Viable Tissue Level: 5.5 0.05 0.01 Debridement A (sq cm): rea Curette Curette Curette Instrument: Minimum Minimum Minimum Bleeding: Pressure Pressure Pressure Hemostasis A chieved: 5 5 5  Procedural Pain: 1 1 1  Post Procedural Pain: Procedure was tolerated well Procedure was tolerated well Procedure was tolerated well Debridement Treatment Response: 3.5x2x0.1 0.3x0.2x0.1 0.1x0.1x0.1 Post Debridement Measurements L x W x D (cm) 0.55 0.005 0.001 Post Debridement Volume: (cm) Scarring: Yes Excoriation: No No Abnormalities Noted Periwound Skin Texture: Induration: No Callus: No Crepitus: No Rash: No Scarring: No No Abnormalities Noted Maceration: No No Abnormalities Noted Periwound Skin Moisture: Dry/Scaly: No Rubor: No Hemosiderin Staining: Yes No Abnormalities Noted Periwound Skin Color: Atrophie Blanche: No Cyanosis: No Ecchymosis: No Erythema: No Mottled: No Pallor: No Rubor: No No Abnormality No Abnormality No Abnormality Temperature: Yes N/A N/A Tenderness on Palpation: Debridement Debridement Debridement Procedures Performed: Treatment Notes Electronic Signature(s) Signed: 03/13/2023 2:55:56 PM By: Duanne Guess MD FACS Entered By: Duanne Guess on 03/13/2023  14:55:55 -------------------------------------------------------------------------------- Multi-Disciplinary Care Plan Details Patient Name: Date of Service: Amber Buffy. 03/13/2023 2:00 PM Medical Record Number: [id number] Patient Account Number: [id number] Date of Birth/Sex: Treating RN: 03/24/1946 (77 y.o. [name] Primary Care Jirah Rider: Eustaquio Boyden Other Clinician: Referring Kavin Weckwerth: Treating Elisha Mcgruder/Extender: Priscille Loveless in Treatment: 32 Multidisciplinary Care Plan reviewed with physician Active Inactive Venous Leg Ulcer Nursing Diagnoses: Knowledge deficit related to disease process and management Potential for venous Insuffiency (use before diagnosis confirmed) Goals: Patient will maintain optimal edema control Date Initiated: 02/07/2023 Target Resolution Date: 07/05/2023 Goal Status: Active Interventions: Assess peripheral edema status every visit. EXA, CHOHAN ([id number]) [id number] Page 4 of 9 Compression as ordered Treatment Activities: Therapeutic compression applied : 02/07/2023 Notes: Wound/Skin Impairment Nursing Diagnoses: Impaired tissue integrity Goals: Ulcer/skin breakdown will have a volume reduction of 30% by week 4 Date Initiated: 08/03/2022 Date Inactivated: 09/10/2022 Target Resolution Date: 08/31/2022 Unmet Reason: uncontrolled edema, Goal Status: Unmet acute infection Ulcer/skin breakdown will have a volume reduction of 50% by week 8 Date Initiated: 09/10/2022 Target Resolution Date: 07/05/2023 Goal Status: Active Interventions: Assess ulceration(s) every visit Treatment Activities: Skin care regimen initiated : 08/03/2022 Notes: [place] ordered. 10/30 RX Levo started. 09/10/22 Electronic Signature(s) Signed: 04/16/2023 7:49:05 AM By: Brenton Grills Entered By: Brenton Grills  on 03/13/2023  14:23:10 -------------------------------------------------------------------------------- Pain Assessment Details Patient Name: Date of Service: [name], [name] 03/13/2023 2:00 PM Medical Record Number: [id number] Patient Account Number: [id number] Date of Birth/Sex: Treating RN: Feb 10, 1946 (77 y.o. [name] Primary Care Klarissa Mcilvain: Eustaquio Boyden Other Clinician: Referring Analeah Brame: Treating [name] [name]/Extender: Priscille Loveless in Treatment: 32 Active Problems Location of Pain Severity and Description of Pain Patient Has Paino No Site Locations Pain Management and Medication Current Pain Management: Electronic Signature(s) Signed: 04/16/2023 7:49:05 AM By: Izell Hopewell, [name] ([id number]) [id number] Page 5 of 9 Entered By: Brenton Grills on 03/13/2023 13:55:31 -------------------------------------------------------------------------------- Patient/Caregiver Education Details Patient Name: Date of Service: [name], [name] 5/8/2024andnbsp2:00 PM Medical Record Number: [id number] Patient Account Number: [id number] Date of Birth/Gender: Treating RN: 02/09/1946 (77 y.o. [name] Primary Care Physician: Eustaquio Boyden Other Clinician: Referring Physician: Treating Physician/Extender: Priscille Loveless in Treatment: 32 Education Assessment Education Provided To: Patient Education Topics Provided Wound/Skin Impairment: Methods: Explain/Verbal Responses: State content correctly Electronic Signature(s) Signed: 04/16/2023 7:49:05 AM By: Brenton Grills Entered By: Brenton Grills on 03/13/2023 14:23:31 -------------------------------------------------------------------------------- Wound Assessment Details Patient Name: Date of Service: [name], [name] 03/13/2023 2:00 PM Medical Record Number: [id number] Patient Account Number: [id number] Date of Birth/Sex: Treating RN: 26-[name]-1946 (77  y.o. [name] Primary Care Ajmal Kathan: Eustaquio Boyden Other Clinician: Referring Freddye Cardamone: Treating Bettyjo Lundblad/Extender: Katharina Caper Weeks in Treatment: 32 Wound Status Wound Number: 2 Primary Venous Leg Ulcer Etiology: Wound Location: Left, Posterior Lower Leg Wound Open Wounding Event: Blister Status: Date Acquired: 07/06/2022 Comorbid Cataracts, Asthma, Hypertension, Neuropathy, Received Weeks Of Treatment: 32 History: Chemotherapy, Received Radiation Clustered Wound: No Photos Wound Measurements Length: (cm) 3.5 Width: (cm) 2 Depth: (cm) 0.1 Area: (cm) 5.498 Volume: (cm) 0.55 % Reduction in Area: 20% % Reduction in Volume: 19.9% Epithelialization: Medium (34-66%) Wound Description Classification: Full Thickness Without Exposed Support Structures [name], [name] ([id number]) Wound Margin: Distinct, outline attached Exudate Amount: Medium Exudate Type: Serosanguineous Exudate Color: red, brown Foul Odor After Cleansing: No [phone].pdf Page 6 of 9 Slough/Fibrino Yes Wound Bed Granulation Amount: Medium (34-66%) Exposed Structure Granulation Quality: Red, Pink, Hyper-granulation Fascia Exposed: No Necrotic Amount: Medium (34-66%) Fat Layer (Subcutaneous Tissue) Exposed: Yes Necrotic Quality: Adherent Slough Tendon Exposed: No Muscle Exposed: No Joint Exposed: No Bone Exposed: No Periwound Skin Texture Texture Color No Abnormalities Noted: Yes No Abnormalities Noted: Yes Moisture Temperature / Pain No Abnormalities Noted: Yes Temperature: No Abnormality Tenderness on Palpation: Yes Electronic Signature(s) Signed: 04/16/2023 7:49:05 AM By: Brenton Grills Entered By: Brenton Grills on 03/13/2023 14:16:35 -------------------------------------------------------------------------------- Wound Assessment Details Patient Name: Date of Service: [name], [name] 03/13/2023 2:00 PM Medical Record Number:  [id number] Patient Account Number: [id number] Date of Birth/Sex: Treating RN: [birth year] (77 y.o. [name] Primary Care Jazleen Robeck: Eustaquio Boyden Other Clinician: Referring Koy Lamp: Treating Chinita Schimpf/Extender: Katharina Caper Weeks in Treatment: 32 Wound Status Wound Number: 4 Primary Abrasion Etiology: Wound Location: Right, Anterior Lower Leg Wound Open Wounding Event: Trauma Status: Date Acquired: 02/28/2023 Comorbid Cataracts, Asthma, Hypertension, Neuropathy, Received Weeks Of Treatment: 1 History: Chemotherapy, Received Radiation Clustered Wound: No Photos Wound Measurements Length: (cm) 0.3 Width: (cm) 0.2 Depth: (cm) 0.1 Area: (cm) 0.047 Volume: (cm) 0.005 % Reduction in Area: 33.8% % Reduction in Volume: 28.6% Epithelialization: Small (1-33%) Wound Description Classification: Full Thickness Without Exposed Support Structures Exudate Amount: Small Exudate Type: Sanguinous Exudate Color: red [name], [name] ([id number]) Wound Bed Granulation Amount: Large (67-100%) Granulation Quality: Red, Hyper-granulation  Necrotic Amount: None Present (0%) Foul Odor After Cleansing: No Slough/Fibrino No [id number].pdf Page 7 of 9 Exposed Structure Fascia Exposed: No Fat Layer (Subcutaneous Tissue) Exposed: Yes Tendon Exposed: No Muscle Exposed: No Joint Exposed: No Bone Exposed: No Periwound Skin Texture Texture Color No Abnormalities Noted: Yes No Abnormalities Noted: No Atrophie Blanche: No Moisture Cyanosis: No No Abnormalities Noted: Yes Ecchymosis: No Erythema: No Hemosiderin Staining: Yes Mottled: No Pallor: No Rubor: No Temperature / Pain Temperature: No Abnormality Electronic Signature(s) Signed: 04/16/2023 7:49:05 AM By: Brenton Grills Entered By: Brenton Grills on 03/13/2023 14:17:24 -------------------------------------------------------------------------------- Wound Assessment Details Patient  Name: Date of Service: [name], [name] 03/13/2023 2:00 PM Medical Record Number: [id number] Patient Account Number: [id number] Date of Birth/Sex: Treating RN: 10-[name]-1946 (77 y.o. [name] Primary Care Yeraldin Litzenberger: Eustaquio Boyden Other Clinician: Referring Steffan Caniglia: Treating Jacinta Penalver/Extender: Katharina Caper Weeks in Treatment: 32 Wound Status Wound Number: 5 Primary Abrasion Etiology: Wound Location: Right, Lateral Ankle Wound Healed - Epithelialized Wounding Event: Trauma Status: Date Acquired: 02/28/2023 Comorbid Cataracts, Asthma, Hypertension, Neuropathy, Received Weeks Of Treatment: 1 History: Chemotherapy, Received Radiation Clustered Wound: No Photos Wound Measurements Length: (cm) Width: (cm) Depth: (cm) Area: (cm) Volume: (cm) 0 % Reduction in Area: 100% 0 % Reduction in Volume: 100% 0 Epithelialization: None 0 Tunneling: No 0 Undermining: No Wound Description Classification: Full Thickness Without Exposed Support Structures Exudate Amount: Small Exudate Type: Serous [name], HERSKOVITZ ([id number]) Exudate Color: Amber Foul Odor After Cleansing: No Slough/Fibrino No [phone].pdf Page 8 of 9 Wound Bed Granulation Amount: None Present (0%) Exposed Structure Necrotic Amount: None Present (0%) Fascia Exposed: No Fat Layer (Subcutaneous Tissue) Exposed: No Tendon Exposed: No Muscle Exposed: No Joint Exposed: No Bone Exposed: No Periwound Skin Texture Texture Color No Abnormalities Noted: Yes No Abnormalities Noted: Yes Moisture Temperature / Pain No Abnormalities Noted: Yes Temperature: No Abnormality Treatment Notes Wound #5 (Ankle) Wound Laterality: Right, Lateral Cleanser Vashe 5.8 (oz) Discharge Instruction: Cleanse the wound with Vashe prior to applying a clean dressing using gauze sponges, not tissue or cotton balls. Wound Cleanser Discharge Instruction: Cleanse the wound with wound cleanser prior  to applying a clean dressing using gauze sponges, not tissue or cotton balls. Peri-Wound Care Topical Primary Dressing Maxorb Extra Calcium Alginate, 2x2 (in/in) Discharge Instruction: Apply to wound bed as instructed Secondary Dressing Woven Gauze Sponge, Non-Sterile 4x4 in Discharge Instruction: Apply over primary dressing as directed. Secured With Compression Wrap Unnaboot w/Calamine, 4x10 (in/yd) Discharge Instruction: Apply Unnaboot as directed. [place] Compression Stockings Facilities manager) Signed: 04/16/2023 7:49:05 AM By: Brenton Grills Entered By: Brenton Grills on 03/13/2023 15:45:44 -------------------------------------------------------------------------------- Vitals Details Patient Name: Date of Service: Amber Buffy. 03/13/2023 2:00 PM Medical Record Number: [id number] Patient Account Number: [id number] Date of Birth/Sex: Treating RN: 07/14/1946 (77 y.o. [name] Primary Care Demaurion Dicioccio: Eustaquio Boyden Other Clinician: Referring Hadlyn Amero: Treating Kwasi Joung/Extender: Priscille Loveless in Treatment: 32 Vital Signs Time Taken: 13:54 Temperature (F): 98.5 Height (in): 63 Pulse (bpm): 73 Weight (lbs): 200 Respiratory Rate (breaths/min): 18 Body Mass Index (BMI): 35.4 Blood Pressure (mmHg): 147/81 Reference Range: 80 - 120 mg / dl AALEYAH, LUNDIE ([id number]) [phone].pdf Page 9 of 9 Electronic Signature(s) Signed: 04/16/2023 7:49:05 AM By: Brenton Grills Entered By: Brenton Grills on 03/13/2023 13:55:23

## 2023-04-16 NOTE — Progress Notes (Signed)
[name], [name] ([id number]) [id number] Page 1 of 13 Visit Report for 03/13/2023 Chief Complaint Document Details Patient Name: Date of Service: Amber, [name] 03/13/2023 2:00 PM Medical Record Number: [id number] Patient Account Number: [id number] Date of Birth/Sex: Treating RN: 1946/01/27 (77 y.o. F) Primary Care Provider: Eustaquio Boyden Other Clinician: Referring Provider: Treating Provider/Extender: Priscille Loveless in Treatment: 32 Information Obtained from: Patient Chief Complaint Patient presents for treatment of an open ulcer due to venous insufficiency Electronic Signature(s) Signed: 03/13/2023 2:56:04 PM By: Duanne Guess MD FACS Entered By: Duanne Guess on 03/13/2023 14:56:03 -------------------------------------------------------------------------------- Debridement Details Patient Name: Date of Service: [name]. 03/13/2023 2:00 PM Medical Record Number: [id number] Patient Account Number: [id number] Date of Birth/Sex: Treating RN: [birth year] (77 y.o. [name] Primary Care Provider: Eustaquio Boyden Other Clinician: Referring Provider: Treating Provider/Extender: Priscille Loveless in Treatment: 32 Debridement Performed for Assessment: Wound #4 Right,Anterior Lower Leg Performed By: Physician Duanne Guess, MD Debridement Type: Debridement Level of Consciousness (Pre-procedure): Awake and Alert Pre-procedure Verification/Time Out Yes - 14:32 Taken: Start Time: 14:33 Pain Control: Lidocaine 4% Topical Solution Percent of Wound Bed Debrided: 100% T Area Debrided (cm): otal 0.05 Tissue and other material debrided: Non-Viable, Eschar, Slough, Slough Level: Non-Viable Tissue Debridement Description: Selective/Open Wound Instrument: Curette Bleeding: Minimum Hemostasis Achieved: Pressure End Time: 14:36 Procedural Pain: 5 Post Procedural Pain: 1 Response to Treatment:  Procedure was tolerated well Level of Consciousness (Post- Awake and Alert procedure): Post Debridement Measurements of Total Wound Length: (cm) 0.3 Width: (cm) 0.2 Depth: (cm) 0.1 Volume: (cm) 0.005 Character of Wound/Ulcer Post Debridement: Improved Post Procedure Diagnosis Same as Pre-procedure Notes Scribed for Dr Lady Gary by Brenton Grills RN. Electronic Signature(s) Signed: 03/13/2023 4:23:55 PM By: Duanne Guess MD FACS [name], [name] ([id number]) [id number] Page 2 of 13 Signed: 04/16/2023 7:49:05 AM By: Brenton Grills Entered By: Brenton Grills on 03/13/2023 14:34:51 -------------------------------------------------------------------------------- Debridement Details Patient Name: Date of Service: [name], [name] 03/13/2023 2:00 PM Medical Record Number: [id number] Patient Account Number: [id number] Date of Birth/Sex: Treating RN: 1946-10-26 (77 y.o. [name] Primary Care Provider: Eustaquio Boyden Other Clinician: Referring Provider: Treating Provider/Extender: Priscille Loveless in Treatment: 32 Debridement Performed for Assessment: Wound #5 Right,Lateral Ankle Performed By: Physician Duanne Guess, MD Debridement Type: Debridement Level of Consciousness (Pre-procedure): Awake and Alert Pre-procedure Verification/Time Out Yes - 14:32 Taken: Start Time: 14:33 Pain Control: Lidocaine 4% Topical Solution Percent of Wound Bed Debrided: 100% T Area Debrided (cm): otal 0.01 Tissue and other material debrided: Non-Viable, Eschar, Slough, Slough Level: Non-Viable Tissue Debridement Description: Selective/Open Wound Instrument: Curette Bleeding: Minimum Hemostasis Achieved: Pressure End Time: 14:36 Procedural Pain: 5 Post Procedural Pain: 1 Response to Treatment: Procedure was tolerated well Level of Consciousness (Post- Awake and Alert procedure): Post Debridement Measurements of Total Wound Length: (cm)  0.1 Width: (cm) 0.1 Depth: (cm) 0.1 Volume: (cm) 0.001 Character of Wound/Ulcer Post Debridement: Improved Post Procedure Diagnosis Same as Pre-procedure Notes Scribed for Dr Lady Gary by Brenton Grills RN. Electronic Signature(s) Signed: 03/13/2023 4:23:55 PM By: Duanne Guess MD FACS Signed: 04/16/2023 7:49:05 AM By: Brenton Grills Entered By: Brenton Grills on 03/13/2023 14:35:09 -------------------------------------------------------------------------------- Debridement Details Patient Name: Date of Service: [name], [name] 03/13/2023 2:00 PM Medical Record Number: [id number] Patient Account Number: [id number] Date of Birth/Sex: Treating RN: 20-Oct-1946 (77 y.o. [name] Primary Care Provider: Eustaquio Boyden Other Clinician: Referring Provider: Treating Provider/Extender: Priscille Loveless in Treatment: 32 Debridement Performed for Assessment:  Wound #2 Left,Posterior Lower Leg Performed By: Physician Duanne Guess, MD Debridement Type: Debridement Severity of Tissue Pre Debridement: Fat layer exposed Level of Consciousness (Pre-procedure): Awake and Alert Pre-procedure Verification/Time Out Yes - 14:32 Taken: Start Time: 14:33 Pain Control: Lidocaine 4% Topical Solution Amber, [name] ([id number]) [id number] Page 3 of 13 Percent of Wound Bed Debrided: 100% T Area Debrided (cm): otal 5.5 Tissue and other material debrided: Non-Viable, Slough, Slough Level: Non-Viable Tissue Debridement Description: Selective/Open Wound Instrument: Curette Bleeding: Minimum Hemostasis Achieved: Pressure End Time: 14:36 Procedural Pain: 5 Post Procedural Pain: 1 Response to Treatment: Procedure was tolerated well Level of Consciousness (Post- Awake and Alert procedure): Post Debridement Measurements of Total Wound Length: (cm) 3.5 Width: (cm) 2 Depth: (cm) 0.1 Volume: (cm) 0.55 Character of Wound/Ulcer Post Debridement:  Improved Severity of Tissue Post Debridement: Fat layer exposed Post Procedure Diagnosis Same as Pre-procedure Notes Scribed for Dr Lady Gary by Brenton Grills RN. Electronic Signature(s) Signed: 03/13/2023 4:23:55 PM By: Duanne Guess MD FACS Signed: 04/16/2023 7:49:05 AM By: Brenton Grills Entered By: Brenton Grills on 03/13/2023 14:36:05 -------------------------------------------------------------------------------- HPI Details Patient Name: Date of Service: [name], [name] 03/13/2023 2:00 PM Medical Record Number: [id number] Patient Account Number: [id number] Date of Birth/Sex: Treating RN: 1946-09-06 (77 y.o. F) Primary Care Provider: Eustaquio Boyden Other Clinician: Referring Provider: Treating Provider/Extender: Priscille Loveless in Treatment: 32 History of Present Illness HPI Description: ADMISSION This is a 77 year old woman with a history of chronic venous insufficiency status post saphenous vein ablations in 2010 and 2016. 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 She presents today with an open venous ulcer on her left lower leg. It has been present for a little over 2 weeks. She saw her primary care provider who apparently swabbed the wound and grew out Pseudomonas. She just completed a course of ciprofloxacin for this. ABI was 0.91. She reports that she has had previous issues with ulcers in this same location, stemming back to a punch biopsy taken by a dermatologist many years ago. She has had several skin substitutes applied to the area that have ultimately resulted in healing on prior occasions. She has 2 small ulcers on her left medial lower leg. There is slough accumulation in both of them. The more anterior of the 2 is quite small and has some soft slough on the surface, underneath which there is good granulation tissue. The more medial wound also has slough accumulation, but the underlying surface is fairly fibrotic and  gritty. This is consistent with her provided history of multiple ulcers in the same location. 08/03/2022: The anterior wound is smaller today with just a little bit of slough accumulation. The more medial wound continues to be very sensitive and fibrotic with slough buildup. 08/13/2022: The anterior wound is closed. The more medial wound remains sensitive with a fairly fibrotic surface. There is more granulation tissue filling in, however, and there is less slough than on prior occasions. 08/20/2022: The anterior wound remains closed. The more medial wound has filled with granulation tissue but still has a fair amount of slough on the surface and remains fairly tender. Unfortunately, she has developed 2 small ulcers just proximal to this. The fat layer is exposed in each. She says that over the weekend, she felt a burning sensation in that location. 08/27/2022: The 2 small wounds proximal to the main wound have merged into a single site. The wounds look a little bit dry, but they are quite clean without  significant slough accumulation. They remain quite tender. 09/03/2022: All of the wounds have now merged into 1 large wound. There is a strong odor coming from the wound and the surface is not particularly viable. It is extremely painful for her today. 09/10/2022: The wound is less black and purple this week but still does not look particularly viable. The surface is desiccated. The culture that I took last week returned with a polymicrobial population including Pseudomonas. I prescribed both Augmentin and levofloxacin. She has not yet picked up levofloxacin, as her pharmacy only notified her yesterday that it was ready. We have ordered a Keystone topical compounded antibiotic, but it has not yet come. [name], [name] ([id number]) [id number] Page 4 of 13 09/17/2022: The wound surface is markedly improved today. There is still an area of grayish-looking muscle but the rest  appears significantly more viable. There is still a layer of slough on the surface. She still has a couple days left of oral antibiotic therapy. She has her Jodie Echevaria compounded topical antibiotic with her today. 09/24/2022: She has completed her oral antibiotic therapy. The wound surface is much cleaner today and more viable without any necrotic tissue. It is a bit desiccated, however. 10/01/2022: The moisture balance of the wound has improved. There is a layer of yellow slough on the surface, but beneath this, the wound is more pink. 10/08/2022: The wound is smaller and cleaner today with a layer of slough present. She is having less pain. 12/18; this patient has a new wound on the right lateral ankle which apparently started after a cat scratched her. Secondarily she managed to drop a cake pan on the same area. This is very painful. The original wound was on the left posterior calf we have been using [employer] under an [employer]. The Unna boot fell down and apparently she was in for a nurse visit perhaps last week and that 1 fell down as well This patient has central venous mediated hypertension. For member correctly she has a stent placed in the left common iliac artery by Dr. Randie Heinz some years ago [facility] Thurner syndrome] she also has had venous ablations and I believe a history of a DVT The patient has compression pumps at home but she does not use them 10/30/2022: The patient says that her wounds burned all week with the [facility] classic in place. She has a fair amount of slough and nonviable subcutaneous tissue present on the right ankle. The left posterior calf wound is cleaner than I have seen it to date. Both remain fairly tender. 11/14/2022: Both wounds have substantial slough and eschar accumulation. They remain extremely tender. 11/21/2022: The wounds are cleaner this week, but still have slough and eschar accumulation. She continues to describe burning pain in both wounds, but  upon further questioning, she actually has burning in both of her feet that radiates up her legs and includes to the wounds. 11/28/2022: Both wounds have slough and eschar accumulation, as well as adherent silver alginate that was difficult to remove. She continues to have pain out of proportion to the extent of her wounds. Her PCP did initiate Lyrica which she started taking last night. The dose is quite low, only 25 mg, but apparently there is a plan for upward titration, assuming she tolerates the medication. 12/05/2022: The wounds are both a little bit smaller today, but have significant slough accumulation, as usual. They remain exquisitely tender. She is now taking Lyrica twice a day. 12/19/2022: Both of her wounds  look significantly better this week. There is slough buildup on both, but they are smaller. She seems to be getting a good response from Lyrica as she is having much less pain. 12/26/2022: The wound on her right lateral ankle is nearly closed. The wound on her left posterior calf is smaller but still has a lot of slough accumulation. They remain tender. 01/02/2023: The right lateral ankle wound is down to just a couple of millimeters. It is much less tender than on prior occasions. There is minimal slough buildup. The wound on her posterior calf continues to contract, as well. It has thick slough buildup and remains fairly tender. 01/09/2023: The right lateral ankle wound is nearly closed. He remains tender. There is just a little eschar on the surface. The wound on her posterior calf has improved quite a bit. There is still slough on the surface, but the more proximal area has epithelialized substantially and there is no longer any gray discoloration to her tissue. 3/13; patient presents for follow-up. Her right lateral ankle wound is almost healed. She has a wound to her left posterior calf with slough and granulation tissue. We have been using Santyl under Unna boots bilaterally to the  lower extremities. She has no issues or complaints today. 01/30/2023: The right lateral ankle wound is down to just a pinhole under a layer of eschar. The left posterior calf wound is quite a bit improved since the last time I saw it. There is still a layer of slough on the surface but there has been more epithelialization. 02/07/2023: The right lateral ankle wound still has some eschar on the surface. The left posterior calf wound is about the same size, but with more areas of epithelialization. 02/20/2023: The right lateral ankle wound is healed. The left posterior calf wound is smaller and is essentially flush with the surrounding skin, but the surface is quite dry. Her leg wrap slipped and her calf is quite swollen above the level of the wound. 03/01/2023: The right lateral ankle wound reopened and she also suffered a fall that resulted in a small wound on her right anterior tibial surface. The left posterior calf wound has a much better moisture balance today. There is a layer of slough on the surface. The [employer] worked much better for her as far as compression this week. 03/06/2023: The right lateral ankle wound has a layer of eschar on the surface and is too painful for me to debride. The anterior tibial surface wound is more or less healed; it is a very superficial abrasion at this point. The left posterior calf wound has a layer of slough on the surface. Her edema control is better. 03/13/2023: The right lateral ankle wound has a layer of eschar on the surface. Once this was debrided, it was found to be healed. The anterior tibial surface wound has a layer of stuck silver alginate on the surface. This was removed to reveal a very superficial abrasion but not much change from last week. The left posterior calf wound is smaller and has its usual layer of accumulated slough. Electronic Signature(s) Signed: 03/13/2023 2:57:08 PM By: Duanne Guess MD FACS Entered By: Duanne Guess on 03/13/2023  14:57:08 -------------------------------------------------------------------------------- Physical Exam Details Patient Name: Date of Service: [name], [name] 03/13/2023 2:00 PM Medical Record Number: [id number] Patient Account Number: [id number] Date of Birth/Sex: Treating RN: 1946/06/03 (77 y.o. F) Primary Care Provider: Eustaquio Boyden Other Clinician: Referring Provider: Treating Provider/Extender: Priscille Loveless in Treatment: [phone]  [name], [name] ([id number]) [id number] Page 5 of 13 Constitutional Slightly hypertensive. . . . no acute distress. Respiratory Normal work of breathing on room air. Notes 03/13/2023: The right lateral ankle wound has a layer of eschar on the surface. Once this was debrided, it was found to be healed. The anterior tibial surface wound has a layer of stuck silver alginate on the surface. This was removed to reveal a very superficial abrasion but not much change from last week. The left posterior calf wound is smaller and has its usual layer of accumulated slough. Electronic Signature(s) Signed: 03/13/2023 2:57:38 PM By: Duanne Guess MD FACS Entered By: Duanne Guess on 03/13/2023 14:57:38 -------------------------------------------------------------------------------- Physician Orders Details Patient Name: Date of Service: [name], [name] 03/13/2023 2:00 PM Medical Record Number: [id number] Patient Account Number: [id number] Date of Birth/Sex: Treating RN: Dec 09, 1945 (77 y.o. [name] Primary Care Provider: Eustaquio Boyden Other Clinician: Referring Provider: Treating Provider/Extender: Priscille Loveless in Treatment: [phone] Verbal / Phone Orders: No Diagnosis Coding ICD-10 Coding Code Description [phone] Non-pressure chronic ulcer of other part of left lower leg with fat layer exposed L03.115 Cellulitis of right lower limb L97.818 Non-pressure chronic ulcer of  other part of right lower leg with other specified severity I83.893 Varicose veins of bilateral lower extremities with other complications I87.2 Venous insufficiency (chronic) (peripheral) G62.9 Polyneuropathy, unspecified R60.0 Localized edema Follow-up Appointments ppointment in 1 week. - Dr. Lady Gary Room 3 Return A Anesthetic Wound #2 Left,Posterior Lower Leg (In clinic) Topical Lidocaine 4% applied to wound bed Bathing/ Shower/ Hygiene May shower with protection but do not get wound dressing(s) wet. Protect dressing(s) with water repellant cover (for example, large plastic bag) or a cast cover and may then take shower. Edema Control - Lymphedema / SCD / Other Left Lower Extremity Lymphedema Pumps. Use Lymphedema pumps on leg(s) 2-3 times a day for 45-60 minutes. If wearing any wraps or hose, do not remove them. Continue exercising as instructed. - Use x1 per day Avoid standing for long periods of time. Exercise regularly Off-Loading Wound #2 Left,Posterior Lower Leg Other: - Elevate legs at heart level or above heart level while sitting. Wound #4 Right,Anterior Lower Leg Other: - Elevate legs at heart level or above heart level while sitting. Wound #5 Right,Lateral Ankle Other: - Elevate legs at heart level or above heart level while sitting. Wound Treatment Wound #2 - Lower Leg Wound Laterality: Left, Posterior Cleanser: Soap and Water 1 x Per Week/30 Days Discharge Instructions: May shower and wash wound with dial antibacterial soap and water prior to dressing change. JAQUA, KOPPLIN ([id number]) [id number] Page 6 of 13 Peri-Wound Care: Sween Lotion (Moisturizing lotion) 1 x Per Week/30 Days Discharge Instructions: Apply moisturizing lotion as directed Topical: Keystone 1 x Per Week/30 Days Discharge Instructions: On hold 03/01/23 Prim Dressing: Santyl Ointment 1 x Per Week/30 Days ary Discharge Instructions: Apply nickel thick amount to wound bed  as instructed Secondary Dressing: T Non-adherent Dressing, 2x3 in 1 x Per Week/30 Days elfa Discharge Instructions: Apply over primary dressing as directed. Secondary Dressing: Zetuvit Plus 4x8 in 1 x Per Week/30 Days Discharge Instructions: Apply over primary dressing as directed. Secured With: 21M Medipore Scientist, research (life sciences) Surgical T 2x10 (in/yd) 1 x Per Week/30 Days ape Discharge Instructions: Secure with tape as directed. Compression Wrap: Unnaboot w/Calamine, 4x10 (in/yd) 1 x Per Week/30 Days Discharge Instructions: Apply Unnaboot as directed. Wound #4 - Lower Leg Wound Laterality: Right, Anterior Cleanser: Vashe 5.8 (oz) 1 x Per  Week/30 Days Discharge Instructions: Cleanse the wound with Vashe prior to applying a clean dressing using gauze sponges, not tissue or cotton balls. Cleanser: Wound Cleanser 1 x Per Week/30 Days Discharge Instructions: Cleanse the wound with wound cleanser prior to applying a clean dressing using gauze sponges, not tissue or cotton balls. Prim Dressing: Maxorb Extra Calcium Alginate, 2x2 (in/in) 1 x Per Week/30 Days ary Discharge Instructions: Apply to wound bed as instructed Secondary Dressing: Woven Gauze Sponge, Non-Sterile 4x4 in 1 x Per Week/30 Days Discharge Instructions: Apply over primary dressing as directed. Compression Wrap: Unnaboot w/Calamine, 4x10 (in/yd) 1 x Per Week/30 Days Discharge Instructions: Apply Unnaboot as directed. Compression Wrap: Stockinette 1 x Per Week/30 Days Wound #5 - Ankle Wound Laterality: Right, Lateral Cleanser: Vashe 5.8 (oz) 1 x Per Week/30 Days Discharge Instructions: Cleanse the wound with Vashe prior to applying a clean dressing using gauze sponges, not tissue or cotton balls. Cleanser: Wound Cleanser 1 x Per Week/30 Days Discharge Instructions: Cleanse the wound with wound cleanser prior to applying a clean dressing using gauze sponges, not tissue or cotton balls. Prim Dressing: Maxorb Extra Calcium Alginate, 2x2  (in/in) 1 x Per Week/30 Days ary Discharge Instructions: Apply to wound bed as instructed Secondary Dressing: Woven Gauze Sponge, Non-Sterile 4x4 in 1 x Per Week/30 Days Discharge Instructions: Apply over primary dressing as directed. Compression Wrap: Unnaboot w/Calamine, 4x10 (in/yd) 1 x Per Week/30 Days Discharge Instructions: Apply Unnaboot as directed. Compression Wrap: Stockinette 1 x Per Week/30 Days Electronic Signature(s) Signed: 03/13/2023 4:23:55 PM By: Duanne Guess MD FACS Entered By: Duanne Guess on 03/13/2023 14:58:05 -------------------------------------------------------------------------------- Problem List Details Patient Name: Date of Service: [name], [name] 03/13/2023 2:00 PM Medical Record Number: [id number] Patient Account Number: [id number] Date of Birth/Sex: Treating RN: 1946-09-03 (77 y.o. [name] Primary Care Provider: Eustaquio Boyden Other Clinician: Referring Provider: Treating Provider/Extender: Priscille Loveless in Treatment: [address], [place] ([id number]) [id number] Page 7 of 13 Active Problems ICD-10 Encounter Code Description Active Date MDM Diagnosis L97.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d9/22/2023 No Yes L03.115 Cellulitis of right lower limb 10/22/2022 No Yes L97.818 Non-pressure chronic ulcer of other part of right lower leg with other specified 03/01/2023 No Yes severity I83.893 Varicose veins of bilateral lower extremities with other complications 07/27/2022 No Yes I87.2 Venous insufficiency (chronic) (peripheral) 07/27/2022 No Yes G62.9 Polyneuropathy, unspecified 07/27/2022 No Yes R60.0 Localized edema 07/27/2022 No Yes Inactive Problems Resolved Problems Electronic Signature(s) Signed: 03/13/2023 2:53:04 PM By: Duanne Guess MD FACS Entered By: Duanne Guess on 03/13/2023  14:53:04 -------------------------------------------------------------------------------- Progress Note Details Patient Name: Date of Service: [name]. 03/13/2023 2:00 PM Medical Record Number: [id number] Patient Account Number: [id number] Date of Birth/Sex: Treating RN: December 31, 1945 (77 y.o. F) Primary Care Provider: Eustaquio Boyden Other Clinician: Referring Provider: Treating Provider/Extender: Priscille Loveless in Treatment: 32 Subjective Chief Complaint Information obtained from Patient Patient presents for treatment of an open ulcer due to venous insufficiency History of Present Illness (HPI) ADMISSION This is a 77 year old woman with a history of chronic venous insufficiency status post saphenous vein ablations in 2010 and 2016. 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 She presents today with an open venous ulcer on her left lower leg. It has been present for a little over 2 weeks. She saw her primary care provider who apparently swabbed the wound and grew out Pseudomonas. She just completed a course of  ciprofloxacin for this. ABI was 0.91. She reports that she has had previous issues with ulcers in this same location, stemming back to a punch biopsy taken by a dermatologist many years ago. She has had several skin substitutes applied to the area that have ultimately resulted in healing on prior occasions. She has 2 small ulcers on her left medial lower leg. There is slough accumulation in both of them. The more anterior of the 2 is quite small and has some soft slough on the surface, underneath which there is good granulation tissue. The more medial wound also has slough accumulation, but the underlying surface is fairly fibrotic and gritty. This is consistent with her provided history of multiple ulcers in the same location. 08/03/2022: The anterior wound is smaller today with just a little bit of slough accumulation. The more  medial wound continues to be very sensitive and fibrotic with slough buildup. LONNI, THRASHER ([id number]) [id number] Page 8 of 13 08/13/2022: The anterior wound is closed. The more medial wound remains sensitive with a fairly fibrotic surface. There is more granulation tissue filling in, however, and there is less slough than on prior occasions. 08/20/2022: The anterior wound remains closed. The more medial wound has filled with granulation tissue but still has a fair amount of slough on the surface and remains fairly tender. Unfortunately, she has developed 2 small ulcers just proximal to this. The fat layer is exposed in each. She says that over the weekend, she felt a burning sensation in that location. 08/27/2022: The 2 small wounds proximal to the main wound have merged into a single site. The wounds look a little bit dry, but they are quite clean without significant slough accumulation. They remain quite tender. 09/03/2022: All of the wounds have now merged into 1 large wound. There is a strong odor coming from the wound and the surface is not particularly viable. It is extremely painful for her today. 09/10/2022: The wound is less black and purple this week but still does not look particularly viable. The surface is desiccated. The culture that I took last week returned with a polymicrobial population including Pseudomonas. I prescribed both Augmentin and levofloxacin. She has not yet picked up levofloxacin, as her pharmacy only notified her yesterday that it was ready. We have ordered a Keystone topical compounded antibiotic, but it has not yet come. 09/17/2022: The wound surface is markedly improved today. There is still an area of grayish-looking muscle but the rest appears significantly more viable. There is still a layer of slough on the surface. She still has a couple days left of oral antibiotic therapy. She has her Jodie Echevaria compounded topical antibiotic with  her today. 09/24/2022: She has completed her oral antibiotic therapy. The wound surface is much cleaner today and more viable without any necrotic tissue. It is a bit desiccated, however. 10/01/2022: The moisture balance of the wound has improved. There is a layer of yellow slough on the surface, but beneath this, the wound is more pink. 10/08/2022: The wound is smaller and cleaner today with a layer of slough present. She is having less pain. 12/18; this patient has a new wound on the right lateral ankle which apparently started after a cat scratched her. Secondarily she managed to drop a cake pan on the same area. This is very painful. The original wound was on the left posterior calf we have been using [employer] under an [employer]. The Unna boot fell down and apparently she was in for  a nurse visit perhaps last week and that 1 fell down as well This patient has central venous mediated hypertension. For member correctly she has a stent placed in the left common iliac artery by Dr. Randie Heinz some years ago [facility] Thurner syndrome] she also has had venous ablations and I believe a history of a DVT The patient has compression pumps at home but she does not use them 10/30/2022: The patient says that her wounds burned all week with the [facility] classic in place. She has a fair amount of slough and nonviable subcutaneous tissue present on the right ankle. The left posterior calf wound is cleaner than I have seen it to date. Both remain fairly tender. 11/14/2022: Both wounds have substantial slough and eschar accumulation. They remain extremely tender. 11/21/2022: The wounds are cleaner this week, but still have slough and eschar accumulation. She continues to describe burning pain in both wounds, but upon further questioning, she actually has burning in both of her feet that radiates up her legs and includes to the wounds. 11/28/2022: Both wounds have slough and eschar accumulation, as well as adherent  silver alginate that was difficult to remove. She continues to have pain out of proportion to the extent of her wounds. Her PCP did initiate Lyrica which she started taking last night. The dose is quite low, only 25 mg, but apparently there is a plan for upward titration, assuming she tolerates the medication. 12/05/2022: The wounds are both a little bit smaller today, but have significant slough accumulation, as usual. They remain exquisitely tender. She is now taking Lyrica twice a day. 12/19/2022: Both of her wounds look significantly better this week. There is slough buildup on both, but they are smaller. She seems to be getting a good response from Lyrica as she is having much less pain. 12/26/2022: The wound on her right lateral ankle is nearly closed. The wound on her left posterior calf is smaller but still has a lot of slough accumulation. They remain tender. 01/02/2023: The right lateral ankle wound is down to just a couple of millimeters. It is much less tender than on prior occasions. There is minimal slough buildup. The wound on her posterior calf continues to contract, as well. It has thick slough buildup and remains fairly tender. 01/09/2023: The right lateral ankle wound is nearly closed. He remains tender. There is just a little eschar on the surface. The wound on her posterior calf has improved quite a bit. There is still slough on the surface, but the more proximal area has epithelialized substantially and there is no longer any gray discoloration to her tissue. 3/13; patient presents for follow-up. Her right lateral ankle wound is almost healed. She has a wound to her left posterior calf with slough and granulation tissue. We have been using Santyl under Unna boots bilaterally to the lower extremities. She has no issues or complaints today. 01/30/2023: The right lateral ankle wound is down to just a pinhole under a layer of eschar. The left posterior calf wound is quite a bit improved  since the last time I saw it. There is still a layer of slough on the surface but there has been more epithelialization. 02/07/2023: The right lateral ankle wound still has some eschar on the surface. The left posterior calf wound is about the same size, but with more areas of epithelialization. 02/20/2023: The right lateral ankle wound is healed. The left posterior calf wound is smaller and is essentially flush with the surrounding skin,  but the surface is quite dry. Her leg wrap slipped and her calf is quite swollen above the level of the wound. 03/01/2023: The right lateral ankle wound reopened and she also suffered a fall that resulted in a small wound on her right anterior tibial surface. The left posterior calf wound has a much better moisture balance today. There is a layer of slough on the surface. The [employer] worked much better for her as far as compression this week. 03/06/2023: The right lateral ankle wound has a layer of eschar on the surface and is too painful for me to debride. The anterior tibial surface wound is more or less healed; it is a very superficial abrasion at this point. The left posterior calf wound has a layer of slough on the surface. Her edema control is better. 03/13/2023: The right lateral ankle wound has a layer of eschar on the surface. Once this was debrided, it was found to be healed. The anterior tibial surface wound has a layer of stuck silver alginate on the surface. This was removed to reveal a very superficial abrasion but not much change from last week. The left posterior calf wound is smaller and has its usual layer of accumulated slough. Patient History [name], POTT ([id number]) [id number] Page 9 of 13 Information obtained from Patient. Family History Cancer - Mother,Paternal Grandparents, Diabetes - Siblings, Heart Disease - Father, Hypertension - Father. Social History Former smoker - quit in 1967. Medical  History Eyes Patient has history of Cataracts - L eye Ear/Nose/Mouth/Throat Denies history of Chronic sinus problems/congestion, Middle ear problems Hematologic/Lymphatic Denies history of Anemia, Human Immunodeficiency Virus, Lymphedema Respiratory Patient has history of Asthma Cardiovascular Patient has history of Hypertension Endocrine Denies history of Type I Diabetes, Type II Diabetes Neurologic Patient has history of Neuropathy - Feet and finger Denies history of Seizure Disorder Oncologic Patient has history of Received Chemotherapy - 2012, Received Radiation - 2012 Hospitalization/Surgery History - Lower extremity venography 2020. - Intravascular ultrasound. - peripheral vascular intervention. - melanoma 2011. 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 no acute distress. Vitals Time Taken: 1:54 PM, Height: 63 in, Weight: 200 lbs, BMI: 35.4, Temperature: 98.5 F, Pulse: 73 bpm, Respiratory Rate: 18 breaths/min, Blood Pressure: 147/81 mmHg. Respiratory Normal work of breathing on room air. General Notes: 03/13/2023: The right lateral ankle wound has a layer of eschar on the surface. Once this was debrided, it was found to be healed. The anterior tibial surface wound has a layer of stuck silver alginate on the surface. This was removed to reveal a very superficial abrasion but not much change from last week. The left posterior calf wound is smaller and has its usual layer of accumulated slough. Integumentary (Hair, Skin) Wound #2 status is Open. Original cause of wound was Blister. The date acquired was: 07/06/2022. The wound has been in treatment 32 weeks. The wound is located on the Left,Posterior Lower Leg. The wound  measures 3.5cm length x 2cm width x 0.1cm depth; 5.498cm^2 area and 0.55cm^3 volume. There is Fat Layer (Subcutaneous Tissue) exposed. There is a medium amount of serosanguineous drainage noted. The wound margin is distinct with the outline attached to the wound base. There is medium (34-66%) red, pink, hyper - granulation within the wound bed. There is a medium (34-66%) amount of necrotic tissue within the wound  bed including Adherent Slough. The periwound skin appearance had no abnormalities noted for texture. The periwound skin appearance had no abnormalities noted for moisture. The periwound skin appearance had no abnormalities noted for color. Periwound temperature was noted as No Abnormality. The periwound has tenderness on palpation. Wound #4 status is Open. Original cause of wound was Trauma. The date acquired was: 02/28/2023. The wound has been in treatment 1 weeks. The wound is located on the Right,Anterior Lower Leg. The wound measures 0.3cm length x 0.2cm width x 0.1cm depth; 0.047cm^2 area and 0.005cm^3 volume. There is Fat Layer (Subcutaneous Tissue) exposed. There is a small amount of sanguinous drainage noted. There is large (67-100%) red, hyper - granulation within the wound bed. There is no necrotic tissue within the wound bed. The periwound skin appearance had no abnormalities noted for texture. The periwound skin appearance had no abnormalities noted for moisture. The periwound skin appearance exhibited: Hemosiderin Staining. The periwound skin appearance did not exhibit: Atrophie Blanche, Cyanosis, Ecchymosis, Mottled, Pallor, Rubor, Erythema. Periwound temperature was noted as No Abnormality. Wound #5 status is Healed - Epithelialized. Original cause of wound was Trauma. The date acquired was: 02/28/2023. The wound has been in treatment 1 weeks. The wound is located on the Right,Lateral Ankle. The wound measures 0cm length x 0cm width x 0cm depth; 0cm^2 area and 0cm^3 volume. There  is no tunneling or undermining noted. There is a small amount of serous drainage noted. There is no granulation within the wound bed. There is no necrotic tissue within the wound bed. The periwound skin appearance had no abnormalities noted for texture. The periwound skin appearance had no abnormalities noted for moisture. The periwound skin appearance had no abnormalities noted for color. Periwound temperature was noted as No Abnormality. ZAREAH, KLEINSASSER ([id number]) [id number] Page 10 of 13 Assessment Active Problems ICD-10 Non-pressure chronic ulcer of other part of left lower leg with fat layer exposed Cellulitis of right lower limb Non-pressure chronic ulcer of other part of right lower leg with other specified severity Varicose veins of bilateral lower extremities with other complications Venous insufficiency (chronic) (peripheral) Polyneuropathy, unspecified Localized edema Procedures Wound #2 Pre-procedure diagnosis of Wound #2 is a Venous Leg Ulcer located on the Left,Posterior Lower Leg .Severity of Tissue Pre Debridement is: Fat layer exposed. There was a Selective/Open Wound Non-Viable Tissue Debridement with a total area of 5.5 sq cm performed by Duanne Guess, MD. With the following instrument(s): Curette to remove Non-Viable tissue/material. Material removed includes [facility] after achieving pain control using Lidocaine 4% Topical Solution. No specimens were taken. A time out was conducted at 14:32, prior to the start of the procedure. A Minimum amount of bleeding was controlled with Pressure. The procedure was tolerated well with a pain level of 5 throughout and a pain level of 1 following the procedure. Post Debridement Measurements: 3.5cm length x 2cm width x 0.1cm depth; 0.55cm^3 volume. Character of Wound/Ulcer Post Debridement is improved. Severity of Tissue Post Debridement is: Fat layer exposed. Post procedure Diagnosis Wound #2: Same as  Pre-Procedure General Notes: Scribed for Dr Lady Gary by Brenton Grills RN.Marland Kitchen Wound #4 Pre-procedure diagnosis of Wound #4 is an Abrasion located on the Right,Anterior Lower Leg . There was a Selective/Open Wound Non-Viable Tissue Debridement with a total area of 0.05 sq cm performed by Duanne Guess, MD. With the following instrument(s): Curette to remove Non-Viable tissue/material. Material removed includes Eschar and Slough and after achieving pain control using Lidocaine 4% T opical Solution. No specimens were  taken. A time out was conducted at 14:32, prior to the start of the procedure. A Minimum amount of bleeding was controlled with Pressure. The procedure was tolerated well with a pain level of 5 throughout and a pain level of 1 following the procedure. Post Debridement Measurements: 0.3cm length x 0.2cm width x 0.1cm depth; 0.005cm^3 volume. Character of Wound/Ulcer Post Debridement is improved. Post procedure Diagnosis Wound #4: Same as Pre-Procedure General Notes: Scribed for Dr Lady Gary by Brenton Grills RN.Marland Kitchen Wound #5 Pre-procedure diagnosis of Wound #5 is an Abrasion located on the Right,Lateral Ankle . There was a Selective/Open Wound Non-Viable Tissue Debridement with a total area of 0.01 sq cm performed by Duanne Guess, MD. With the following instrument(s): Curette to remove Non-Viable tissue/material. Material removed includes Eschar and Slough and after achieving pain control using Lidocaine 4% T opical Solution. No specimens were taken. A time out was conducted at 14:32, prior to the start of the procedure. A Minimum amount of bleeding was controlled with Pressure. The procedure was tolerated well with a pain level of 5 throughout and a pain level of 1 following the procedure. Post Debridement Measurements: 0.1cm length x 0.1cm width x 0.1cm depth; 0.001cm^3 volume. Character of Wound/Ulcer Post Debridement is improved. Post procedure Diagnosis Wound #5: Same as  Pre-Procedure General Notes: Scribed for Dr Lady Gary by Brenton Grills RN.Marland Kitchen Plan Follow-up Appointments: Return Appointment in 1 week. - Dr. Lady Gary Room 3 Anesthetic: Wound #2 Left,Posterior Lower Leg: (In clinic) Topical Lidocaine 4% applied to wound bed Bathing/ Shower/ Hygiene: May shower with protection but do not get wound dressing(s) wet. Protect dressing(s) with water repellant cover (for example, large plastic bag) or a cast cover and may then take shower. Edema Control - Lymphedema / SCD / Other: Lymphedema Pumps. Use Lymphedema pumps on leg(s) 2-3 times a day for 45-60 minutes. If wearing any wraps or hose, do not remove them. Continue exercising as instructed. - Use x1 per day Avoid standing for long periods of time. Exercise regularly Off-Loading: Wound #2 Left,Posterior Lower Leg: Other: - Elevate legs at heart level or above heart level while sitting. Wound #4 Right,Anterior Lower Leg: Other: - Elevate legs at heart level or above heart level while sitting. Wound #5 Right,Lateral Ankle: Other: - Elevate legs at heart level or above heart level while sitting. WOUND #2: - Lower Leg Wound Laterality: Left, Posterior Cleanser: Soap and Water 1 x Per Week/30 Days Discharge Instructions: May shower and wash wound with dial antibacterial soap and water prior to dressing change. Peri-Wound Care: Sween Lotion (Moisturizing lotion) 1 x Per Week/30 Days Discharge Instructions: Apply moisturizing lotion as directed Topical: Keystone 1 x Per Week/30 Days Discharge Instructions: On hold 03/01/23 OLEVIA, BUDMAN ([id number]) [phone].pdf Page 11 of 13 Prim Dressing: Santyl Ointment 1 x Per Week/30 Days ary Discharge Instructions: Apply nickel thick amount to wound bed as instructed Secondary Dressing: T Non-adherent Dressing, 2x3 in 1 x Per Week/30 Days elfa Discharge Instructions: Apply over primary dressing as directed. Secondary Dressing: Zetuvit Plus 4x8  in 1 x Per Week/30 Days Discharge Instructions: Apply over primary dressing as directed. Secured With: 57M Medipore Scientist, research (life sciences) Surgical T 2x10 (in/yd) 1 x Per Week/30 Days ape Discharge Instructions: Secure with tape as directed. Com pression Wrap: Unnaboot w/Calamine, 4x10 (in/yd) 1 x Per Week/30 Days Discharge Instructions: Apply Unnaboot as directed. WOUND #4: - Lower Leg Wound Laterality: Right, Anterior Cleanser: Vashe 5.8 (oz) 1 x Per Week/30 Days Discharge Instructions: Cleanse the wound with  Vashe prior to applying a clean dressing using gauze sponges, not tissue or cotton balls. Cleanser: Wound Cleanser 1 x Per Week/30 Days Discharge Instructions: Cleanse the wound with wound cleanser prior to applying a clean dressing using gauze sponges, not tissue or cotton balls. Prim Dressing: Maxorb Extra Calcium Alginate, 2x2 (in/in) 1 x Per Week/30 Days ary Discharge Instructions: Apply to wound bed as instructed Secondary Dressing: Woven Gauze Sponge, Non-Sterile 4x4 in 1 x Per Week/30 Days Discharge Instructions: Apply over primary dressing as directed. Com pression Wrap: Unnaboot w/Calamine, 4x10 (in/yd) 1 x Per Week/30 Days Discharge Instructions: Apply Unnaboot as directed. Com pression Wrap: Stockinette 1 x Per Week/30 Days WOUND #5: - Ankle Wound Laterality: Right, Lateral Cleanser: Vashe 5.8 (oz) 1 x Per Week/30 Days Discharge Instructions: Cleanse the wound with Vashe prior to applying a clean dressing using gauze sponges, not tissue or cotton balls. Cleanser: Wound Cleanser 1 x Per Week/30 Days Discharge Instructions: Cleanse the wound with wound cleanser prior to applying a clean dressing using gauze sponges, not tissue or cotton balls. Prim Dressing: Maxorb Extra Calcium Alginate, 2x2 (in/in) 1 x Per Week/30 Days ary Discharge Instructions: Apply to wound bed as instructed Secondary Dressing: Woven Gauze Sponge, Non-Sterile 4x4 in 1 x Per Week/30 Days Discharge Instructions:  Apply over primary dressing as directed. Com pression Wrap: Unnaboot w/Calamine, 4x10 (in/yd) 1 x Per Week/30 Days Discharge Instructions: Apply Unnaboot as directed. Com pression Wrap: Stockinette 1 x Per Week/30 Days 03/13/2023: The right lateral ankle wound has a layer of eschar on the surface. Once this was debrided, it was found to be healed. The anterior tibial surface wound has a layer of stuck silver alginate on the surface. This was removed to reveal a very superficial abrasion but not much change from last week. The left posterior calf wound is smaller and has its usual layer of accumulated slough. I used a curette to debride the eschar off of both wounds on the right. This revealed healing of the ankle wound. I used a curette to debride eschar and caked on silver alginate from the anterior right lower leg wound. I also used a curette to debride the slough from the left posterior calf wound. We will continue to use Santyl on the left calf to continue the enzymatic debridement. Continue silver alginate to the right anterior lower leg wound. Continue bilateral Unna boots with calamine. Follow-up in 1 week. Electronic Signature(s) Signed: 03/15/2023 4:16:18 PM By: Shawn Stall RN, [name] Signed: 03/15/2023 4:20:07 PM By: Duanne Guess MD FACS Previous Signature: 03/13/2023 2:59:19 PM Version By: Duanne Guess MD FACS Entered By: Shawn Stall on 03/15/2023 14:53:55 -------------------------------------------------------------------------------- HxROS Details Patient Name: Date of Service: ASPYN, CESARIO 03/13/2023 2:00 PM Medical Record Number: [id number] Patient Account Number: [id number] Date of Birth/Sex: Treating RN: [name] 23, 1946 (77 y.o. F) Primary Care Provider: Eustaquio Boyden Other Clinician: Referring Provider: Treating Provider/Extender: Priscille Loveless in Treatment: 32 Information Obtained From Patient Eyes Medical History: Positive for: Cataracts  - L eye Ear/Nose/Mouth/Throat Medical History: Negative for: Chronic sinus problems/congestion; Middle ear problems Hematologic/Lymphatic Medical History: Negative for: Anemia; Human Immunodeficiency Virus; Lymphedema JEWELLE, ROWBERRY ([id number]) [id number] Page 12 of 13 Respiratory Medical History: Positive for: Asthma Past Medical History Notes: OSA on CPAP Cardiovascular Medical History: Positive for: Hypertension Past Medical History Notes: Cronic venous insufficiency 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 Gastrointestinal Medical History: Past Medical History Notes: liver cyst Endocrine Medical History: Negative for: Type I Diabetes; Type II  Diabetes Musculoskeletal Medical History: Past Medical History Notes: arthritis Neurologic Medical History: Positive for: Neuropathy - Feet and finger Negative for: Seizure Disorder Past Medical History Notes: restless leg syndrome Oncologic Medical History: Positive for: Received Chemotherapy - 2012; Received Radiation - 2012 HBO Extended History Items Eyes: Cataracts Immunizations Pneumococcal Vaccine: Received Pneumococcal Vaccination: Yes Received Pneumococcal Vaccination On or After 60th Birthday: Yes Implantable Devices No devices added Hospitalization / Surgery History Type of Hospitalization/Surgery Lower extremity venography 2020 Intravascular ultrasound peripheral vascular intervention melanoma 2011 Family and Social History Cancer: Yes - Mother,Paternal Grandparents; Diabetes: Yes - Siblings; Heart Disease: Yes - Father; Hypertension: Yes - Father; Former smoker - quit in [employer]) Signed: 03/13/2023 4:23:55 PM By: Duanne Guess MD FACS Entered By: Duanne Guess on 03/13/2023 14:57:16 [name] ([id number]) [id number] Page 13 of  13 -------------------------------------------------------------------------------- SuperBill Details Patient Name: Date of Service: MUBINA, PRONOVOST 03/13/2023 Medical Record Number: [id number] Patient Account Number: [id number] Date of Birth/Sex: Treating RN: 1946/02/13 (77 y.o. [name] Primary Care Provider: Eustaquio Boyden Other Clinician: Referring Provider: Treating Provider/Extender: Priscille Loveless in Treatment: 32 Diagnosis Coding ICD-10 Codes Code Description [phone] Non-pressure chronic ulcer of other part of left lower leg with fat layer exposed L03.115 Cellulitis of right lower limb L97.818 Non-pressure chronic ulcer of other part of right lower leg with other specified severity I83.893 Varicose veins of bilateral lower extremities with other complications I87.2 Venous insufficiency (chronic) (peripheral) G62.9 Polyneuropathy, unspecified R60.0 Localized edema Facility Procedures : CPT4 Code: [id number] Description: 97597 - DEBRIDE WOUND 1ST 20 SQ CM OR < ICD-10 Diagnosis Description L97.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d L97.818 Non-pressure chronic ulcer of other part of right lower leg with other  specified s Modifier: everity Quantity: 1 Physician Procedures : CPT4 Code Description Modifier [id number] - WC PHYS LEVEL 4 - EST PT 25 ICD-10 Diagnosis Description L97.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d L97.818 Non-pressure chronic ulcer of other part of right  lower leg with other specified severity I87.2 Venous insufficiency (chronic) (peripheral) R60.0 Localized edema Quantity: 1 : [id number] 97597 - WC PHYS DEBR WO ANESTH 20 SQ CM ICD-10 Diagnosis Description L97.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d L97.818 Non-pressure chronic ulcer of other part of right lower leg with other  specified  severity Quantity: 1 Electronic Signature(s) Signed: 03/13/2023 2:59:47 PM By: Duanne Guess MD FACS Entered By: Duanne Guess on 03/13/2023 14:59:46

## 2023-04-16 NOTE — Progress Notes (Signed)
MADOLIN, STALLARD ([id number]) [id number] Page 1 of 12 Visit Report for 03/06/2023 Chief Complaint Document Details Patient Name: Date of Service: [name], [name] 03/06/2023 2:00 PM Medical Record Number: [id number] Patient Account Number: [id number] Date of Birth/Sex: Treating RN: [birth year] (77 y.o. F) Primary Care Provider: Eustaquio Boyden Other Clinician: Referring Provider: Treating Provider/Extender: Priscille Loveless in Treatment: 31 Information Obtained from: Patient Chief Complaint Patient presents for treatment of an open ulcer due to venous insufficiency Electronic Signature(s) Signed: 03/06/2023 2:59:51 PM By: Duanne Guess MD FACS Entered By: Duanne Guess on 03/06/2023 14:59:50 -------------------------------------------------------------------------------- Debridement Details Patient Name: Date of Service: [name]. 03/06/2023 2:00 PM Medical Record Number: [id number] Patient Account Number: [id number] Date of Birth/Sex: Treating RN: 24-Nov-1945 (77 y.o. [name] Primary Care Provider: Eustaquio Boyden Other Clinician: Referring Provider: Treating Provider/Extender: Priscille Loveless in Treatment: 31 Debridement Performed for Assessment: Wound #2 Left,Posterior Lower Leg Performed By: Physician Duanne Guess, MD Debridement Type: Debridement Severity of Tissue Pre Debridement: Fat layer exposed Level of Consciousness (Pre-procedure): Awake and Alert Pre-procedure Verification/Time Out Yes - 14:34 Taken: Start Time: 14:35 Pain Control: Lidocaine 4% T opical Solution Percent of Wound Bed Debrided: 100% T Area Debrided (cm): otal 5.5 Tissue and other material debrided: Non-Viable, Slough, Slough Level: Non-Viable Tissue Debridement Description: Selective/Open Wound Instrument: Curette Bleeding: Minimum Hemostasis Achieved: Pressure End Time: 14:37 Procedural Pain:  3 Post Procedural Pain: 3 Response to Treatment: Procedure was tolerated well Level of Consciousness (Post- Awake and Alert procedure): Post Debridement Measurements of Total Wound Length: (cm) 3.5 Width: (cm) 2 Depth: (cm) 0.1 Volume: (cm) 0.55 Character of Wound/Ulcer Post Debridement: Improved Severity of Tissue Post Debridement: Fat layer exposed Post Procedure Diagnosis Same as Pre-procedure Notes scribed for Dr. Lady Gary by Brenton Grills RN, RN [place], [name] ([id number]) [phone].pdf Page 2 of 12 Electronic Signature(s) Signed: 03/06/2023 4:34:50 PM By: Duanne Guess MD FACS Signed: 04/16/2023 7:49:05 AM By: Brenton Grills Entered By: Brenton Grills on 03/06/2023 14:36:41 -------------------------------------------------------------------------------- Debridement Details Patient Name: Date of Service: [name], [name] 03/06/2023 2:00 PM Medical Record Number: [id number] Patient Account Number: [id number] Date of Birth/Sex: Treating RN: Jun 27, 1946 (77 y.o. [name] Primary Care Provider: Eustaquio Boyden Other Clinician: Referring Provider: Treating Provider/Extender: Priscille Loveless in Treatment: 31 Debridement Performed for Assessment: Wound #4 Right,Anterior Lower Leg Performed By: Physician Duanne Guess, MD Debridement Type: Debridement Level of Consciousness (Pre-procedure): Awake and Alert Pre-procedure Verification/Time Out Yes - 14:34 Taken: Start Time: 14:35 Pain Control: Lidocaine 4% T opical Solution Percent of Wound Bed Debrided: 100% T Area Debrided (cm): otal 0.05 Tissue and other material debrided: Non-Viable, Slough, Slough Level: Non-Viable Tissue Debridement Description: Selective/Open Wound Instrument: Curette Bleeding: Minimum Hemostasis Achieved: Pressure End Time: 14:37 Procedural Pain: 3 Post Procedural Pain: 3 Response to Treatment: Procedure was tolerated well Level of  Consciousness (Post- Awake and Alert procedure): Post Debridement Measurements of Total Wound Length: (cm) 0.3 Width: (cm) 0.2 Depth: (cm) 0.1 Volume: (cm) 0.005 Character of Wound/Ulcer Post Debridement: Improved Post Procedure Diagnosis Same as Pre-procedure Notes Scribed for Dr Lady Gary by Brenton Grills RN. Electronic Signature(s) Signed: 03/06/2023 4:34:50 PM By: Duanne Guess MD FACS Signed: 04/16/2023 7:49:05 AM By: Brenton Grills Entered By: Brenton Grills on 03/06/2023 16:02:53 -------------------------------------------------------------------------------- HPI Details Patient Name: Date of Service: [name], [name] 03/06/2023 2:00 PM Medical Record Number: [id number] Patient Account Number: [id number] Date of Birth/Sex: Treating RN: [birth year] (77 y.o. F) Primary Care Provider: Eustaquio Boyden Other Clinician:  Referring Provider: Treating Provider/Extender: Priscille Loveless in Treatment: 31 History of Present Illness HPI Description: ADMISSION This is a 77 year old woman with a history of chronic venous insufficiency status post saphenous vein ablations in 2010 and 2016. 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 She presents today with an open venous ulcer on her left lower leg. It has been present for a little over 2 weeks. She saw her primary care provider who apparently swabbed the wound and grew out Pseudomonas. She just completed a course of ciprofloxacin for this. ABI was 0.91. She reports that she has had previous issues with ulcers in this same location, stemming back to a punch biopsy taken by a dermatologist many years ago. She has had several skin substitutes applied to the area that have ultimately resulted in healing on prior occasions. She has 2 small ulcers on her left medial lower leg. There is slough accumulation in both of them.  The more anterior of the 2 is quite small and has some soft slough on the surface, underneath which there is good granulation tissue. The more medial wound also has slough accumulation, but the underlying surface is fairly fibrotic and gritty. This is consistent with her provided history of multiple ulcers in the same location. 08/03/2022: The anterior wound is smaller today with just a little bit of slough accumulation. The more medial wound continues to be very sensitive and fibrotic with slough buildup. 08/13/2022: The anterior wound is closed. The more medial wound remains sensitive with a fairly fibrotic surface. There is more granulation tissue filling in, however, and there is less slough than on prior occasions. 08/20/2022: The anterior wound remains closed. The more medial wound has filled with granulation tissue but still has a fair amount of slough on the surface and remains fairly tender. Unfortunately, she has developed 2 small ulcers just proximal to this. The fat layer is exposed in each. She says that over the weekend, she felt a burning sensation in that location. 08/27/2022: The 2 small wounds proximal to the main wound have merged into a single site. The wounds look a little bit dry, but they are quite clean without significant slough accumulation. They remain quite tender. 09/03/2022: All of the wounds have now merged into 1 large wound. There is a strong odor coming from the wound and the surface is not particularly viable. It is extremely painful for her today. 09/10/2022: The wound is less black and purple this week but still does not look particularly viable. The surface is desiccated. The culture that I took last week returned with a polymicrobial population including Pseudomonas. I prescribed both Augmentin and levofloxacin. She has not yet picked up levofloxacin, as her pharmacy only notified her yesterday that it was ready. We have ordered a Keystone topical compounded  antibiotic, but it has not yet come. 09/17/2022: The wound surface is markedly improved today. There is still an area of grayish-looking muscle but the rest appears significantly more viable. There is still a layer of slough on the surface. She still has a couple days left of oral antibiotic therapy. She has her Jodie Echevaria compounded topical antibiotic with her today. 09/24/2022: She has completed her oral antibiotic therapy. The wound surface is much cleaner today and more viable without any necrotic tissue. It is a bit desiccated, however. 10/01/2022: The moisture balance of the wound has improved. There is a layer of yellow slough on  the surface, but beneath this, the wound is more pink. 10/08/2022: The wound is smaller and cleaner today with a layer of slough present. She is having less pain. 12/18; this patient has a new wound on the right lateral ankle which apparently started after a cat scratched her. Secondarily she managed to drop a cake pan on the same area. This is very painful. The original wound was on the left posterior calf we have been using [employer] under an [employer]. The Unna boot fell down and apparently she was in for a nurse visit perhaps last week and that 1 fell down as well This patient has central venous mediated hypertension. For member correctly she has a stent placed in the left common iliac artery by Dr. Randie Heinz some years ago [facility] Thurner syndrome] she also has had venous ablations and I believe a history of a DVT The patient has compression pumps at home but she does not use them 10/30/2022: The patient says that her wounds burned all week with the [facility] classic in place. She has a fair amount of slough and nonviable subcutaneous tissue present on the right ankle. The left posterior calf wound is cleaner than I have seen it to date. Both remain fairly tender. 11/14/2022: Both wounds have substantial slough and eschar accumulation. They remain extremely  tender. 11/21/2022: The wounds are cleaner this week, but still have slough and eschar accumulation. She continues to describe burning pain in both wounds, but upon further questioning, she actually has burning in both of her feet that radiates up her legs and includes to the wounds. 11/28/2022: Both wounds have slough and eschar accumulation, as well as adherent silver alginate that was difficult to remove. She continues to have pain out of proportion to the extent of her wounds. Her PCP did initiate Lyrica which she started taking last night. The dose is quite low, only 25 mg, but apparently there is a plan for upward titration, assuming she tolerates the medication. 12/05/2022: The wounds are both a little bit smaller today, but have significant slough accumulation, as usual. They remain exquisitely tender. She is now taking Lyrica twice a day. 12/19/2022: Both of her wounds look significantly better this week. There is slough buildup on both, but they are smaller. She seems to be getting a good response from Lyrica as she is having much less pain. 12/26/2022: The wound on her right lateral ankle is nearly closed. The wound on her left posterior calf is smaller but still has a lot of slough accumulation. They remain tender. 01/02/2023: The right lateral ankle wound is down to just a couple of millimeters. It is much less tender than on prior occasions. There is minimal slough buildup. The wound on her posterior calf continues to contract, as well. It has thick slough buildup and remains fairly tender. 01/09/2023: The right lateral ankle wound is nearly closed. He remains tender. There is just a little eschar on the surface. The wound on her posterior calf has improved quite a bit. There is still slough on the surface, but the more proximal area has epithelialized substantially and there is no longer any gray discoloration to her tissue. 3/13; patient presents for follow-up. Her right lateral ankle wound  is almost healed. She has a wound to her left posterior calf with slough and granulation tissue. We have been using Santyl under Unna boots bilaterally to the lower extremities. She has no issues or complaints today. 01/30/2023: The right lateral ankle wound is  down to just a pinhole under a layer of eschar. The left posterior calf wound is quite a bit improved since the last time I saw it. There is still a layer of slough on the surface but there has been more epithelialization. 02/07/2023: The right lateral ankle wound still has some eschar on the surface. The left posterior calf wound is about the same size, but with more areas of epithelialization. 02/20/2023: The right lateral ankle wound is healed. The left posterior calf wound is smaller and is essentially flush with the surrounding skin, but the surface is quite dry. Her leg wrap slipped and her calf is quite swollen above the level of the wound. 03/01/2023: The right lateral ankle wound reopened and she also suffered a fall that resulted in a small wound on her right anterior tibial surface. The left MADALYN, RABOIN ([id number]) [id number] Page 4 of 12 posterior calf wound has a much better moisture balance today. There is a layer of slough on the surface. The [employer] worked much better for her as far as compression this week. 03/06/2023: The right lateral ankle wound has a layer of eschar on the surface and is too painful for me to debride. The anterior tibial surface wound is more or less healed; it is a very superficial abrasion at this point. The left posterior calf wound has a layer of slough on the surface. Her edema control is better. Electronic Signature(s) Signed: 03/06/2023 3:00:47 PM By: Duanne Guess MD FACS Entered By: Duanne Guess on 03/06/2023 15:00:46 -------------------------------------------------------------------------------- Physical Exam Details Patient Name: Date of Service: [name], [name] 03/06/2023 2:00 PM Medical Record Number: [id number] Patient Account Number: [id number] Date of Birth/Sex: Treating RN: 25-May-1946 (77 y.o. F) Primary Care Provider: Eustaquio Boyden Other Clinician: Referring Provider: Treating Provider/Extender: Priscille Loveless in Treatment: 31 Constitutional Slightly hypertensive. . . . no acute distress. Respiratory Normal work of breathing on room air. Notes 03/06/2023: The right lateral ankle wound has a layer of eschar on the surface and is too painful for me to debride. The anterior tibial surface wound is more or less healed; it is a very superficial abrasion at this point. The left posterior calf wound has a layer of slough on the surface. Her edema control is better. Electronic Signature(s) Signed: 03/06/2023 3:01:47 PM By: Duanne Guess MD FACS Entered By: Duanne Guess on 03/06/2023 15:01:47 -------------------------------------------------------------------------------- Physician Orders Details Patient Name: Date of Service: [name], [name] 03/06/2023 2:00 PM Medical Record Number: [id number] Patient Account Number: [id number] Date of Birth/Sex: Treating RN: 1946/01/07 (77 y.o. 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 - Dr. Lady Gary Room  3 Return A Anesthetic Wound #2 Left,Posterior Lower Leg (In clinic) Topical Lidocaine 4% applied to wound bed Bathing/ Shower/ Hygiene May shower with protection but do not get wound dressing(s) wet. Protect dressing(s) with water repellant cover (for example, large plastic bag) or a cast cover and may then take shower. JAELEY, HEMSWORTH ([id number]) [id number] Page  5 of 12 Edema Control - Lymphedema / SCD / Other Left Lower Extremity Lymphedema Pumps. Use Lymphedema pumps on leg(s) 2-3 times a day for 45-60 minutes. If wearing any wraps or hose, do not remove them. Continue exercising as instructed. - Use x1 per day Avoid standing for long periods of time. Exercise regularly Off-Loading Wound #2 Left,Posterior Lower Leg Other: - Elevate legs at heart level or above heart level while sitting. Wound #4 Right,Anterior Lower Leg Other: - Elevate legs at heart level or above heart level while sitting. Wound #5 Right,Lateral Ankle Other: - Elevate legs at heart level or above heart level while sitting. Wound Treatment Wound #2 - Lower Leg Wound Laterality: Left, Posterior Cleanser: Soap and Water 1 x Per Week/30 Days Discharge Instructions: May shower and wash wound with dial antibacterial soap and water prior to dressing change. Peri-Wound Care: Sween Lotion (Moisturizing lotion) 1 x Per Week/30 Days Discharge Instructions: Apply moisturizing lotion as directed Topical: Keystone 1 x Per Week/30 Days Discharge Instructions: On hold 03/01/23 Prim Dressing: Santyl Ointment 1 x Per Week/30 Days ary Discharge Instructions: Apply nickel thick amount to wound bed as instructed Secondary Dressing: T Non-adherent Dressing, 2x3 in 1 x Per Week/30 Days elfa Discharge Instructions: Apply over primary dressing as directed. Secondary Dressing: Zetuvit Plus 4x8 in 1 x Per Week/30 Days Discharge Instructions: Apply over primary dressing as directed. Secured With: 18M  Medipore Scientist, research (life sciences) Surgical T 2x10 (in/yd) 1 x Per Week/30 Days ape Discharge Instructions: Secure with tape as directed. Compression Wrap: Unnaboot w/Calamine, 4x10 (in/yd) 1 x Per Week/30 Days Discharge Instructions: Apply Unnaboot as directed. Wound #4 - Lower Leg Wound Laterality: Right, Anterior Cleanser: Vashe 5.8 (oz) 1 x Per Week/30 Days Discharge Instructions: Cleanse the wound with Vashe prior to applying a clean dressing using gauze sponges, not tissue or cotton balls. Cleanser: Wound Cleanser 1 x Per Week/30 Days Discharge Instructions: Cleanse the wound with wound cleanser prior to applying a clean dressing using gauze sponges, not tissue or cotton balls. Prim Dressing: Maxorb Extra Calcium Alginate, 2x2 (in/in) 1 x Per Week/30 Days ary Discharge Instructions: Apply to wound bed as instructed Secondary Dressing: Woven Gauze Sponge, Non-Sterile 4x4 in 1 x Per Week/30 Days Discharge Instructions: Apply over primary dressing as directed. Compression Wrap: Unnaboot w/Calamine, 4x10 (in/yd) 1 x Per Week/30 Days Discharge Instructions: Apply Unnaboot as directed. Compression Wrap: Stockinette 1 x Per Week/30 Days Wound #5 - Ankle Wound Laterality: Right, Lateral Cleanser: Vashe 5.8 (oz) 1 x Per Week/30 Days Discharge Instructions: Cleanse the wound with Vashe prior to applying a clean dressing using gauze sponges, not tissue or cotton balls. Cleanser: Wound Cleanser 1 x Per Week/30 Days Discharge Instructions: Cleanse the wound with wound cleanser prior to applying a clean dressing using gauze sponges, not tissue or cotton balls. Prim Dressing: Maxorb Extra Calcium Alginate, 2x2 (in/in) 1 x Per Week/30 Days ary Discharge Instructions: Apply to wound bed as instructed Secondary Dressing: Woven Gauze Sponge, Non-Sterile 4x4 in 1 x Per Week/30 Days Discharge Instructions: Apply over primary dressing as directed. Compression Wrap: Unnaboot w/Calamine, 4x10 (in/yd) 1 x Per Week/30  Days Discharge Instructions: Apply Unnaboot as directed. [name], [name] ([id number]) [id number] Page 6 of 12 Compression Wrap: Stockinette 1 x Per Week/30 Days Electronic Signature(s) Signed: 03/06/2023 4:34:50 PM By: Duanne Guess MD FACS Entered By: Duanne Guess on 03/06/2023 15:02:03 -------------------------------------------------------------------------------- Problem List Details Patient Name: Date of Service: [name], [name] 03/06/2023 2:00 PM Medical Record Number: [id number] Patient  Account Number: [id number] Date of Birth/Sex: Treating RN: [birth year] (77 y.o. F) Primary Care Provider: Eustaquio Boyden Other Clinician: Referring Provider: Treating Provider/Extender: Priscille Loveless in Treatment: 31 Active Problems ICD-10 Encounter Code Description Active Date MDM Diagnosis [phone] Non-pressure chronic ulcer of other part of left lower leg with fat layer exposed9/22/2023 No Yes L03.115 Cellulitis of right lower limb 10/22/2022 No Yes L97.818 Non-pressure chronic ulcer of other part of right lower leg with other specified 03/01/2023 No Yes severity I83.893 Varicose veins of bilateral lower extremities with other complications 07/27/2022 No Yes I87.2 Venous insufficiency (chronic) (peripheral) 07/27/2022 No Yes G62.9 Polyneuropathy, unspecified 07/27/2022 No Yes R60.0 Localized edema 07/27/2022 No Yes Inactive Problems Resolved Problems Electronic Signature(s) Signed: 03/06/2023 2:59:35 PM By: Duanne Guess MD FACS Entered By: Duanne Guess on 03/06/2023 14:59:35 -------------------------------------------------------------------------------- Progress Note Details Patient Name: Date of Service: [name]. 03/06/2023 2:00 PM Medical Record Number: [id number] Patient Account Number: [id number] Date of Birth/Sex: Treating RN: [birth year] (77 y.o. F) Primary Care Provider: Eustaquio Boyden Other  Clinician: Referring Provider: Treating Provider/Extender: Katharina Caper [place], [name] ([id number]) [id number] Page 7 of 12 Weeks in Treatment: 31 Subjective Chief Complaint Information obtained from Patient Patient presents for treatment of an open ulcer due to venous insufficiency History of Present Illness (HPI) ADMISSION This is a 77 year old woman with a history of chronic venous insufficiency status post saphenous vein ablations in 2010 and 2016. 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 She presents today with an open venous ulcer on her left lower leg. It has been present for a little over 2 weeks. She saw her primary care provider who apparently swabbed the wound and grew out Pseudomonas. She just completed a course of ciprofloxacin for this. ABI was 0.91. She reports that she has had previous issues with ulcers in this same location, stemming back to a punch biopsy taken by a dermatologist many years ago. She has had several skin substitutes applied to the area that have ultimately resulted in healing on prior occasions. She has 2 small ulcers on her left medial lower leg. There is slough accumulation in both of them. The more anterior of the 2 is quite small and has some soft slough on the surface, underneath which there is good granulation tissue. The more medial wound also has slough accumulation, but the underlying surface is fairly fibrotic and gritty. This is consistent with her provided history of multiple ulcers in the same location. 08/03/2022: The anterior wound is smaller today with just a little bit of slough accumulation. The more medial wound continues to be very sensitive and fibrotic with slough buildup. 08/13/2022: The anterior wound is closed. The more medial wound remains sensitive with a fairly fibrotic surface. There is more granulation tissue filling in, however, and there is less slough  than on prior occasions. 08/20/2022: The anterior wound remains closed. The more medial wound has filled with granulation tissue but still has a fair amount of slough on the surface and remains fairly tender. Unfortunately, she has developed 2 small ulcers just proximal to this. The fat layer is exposed in each. She says that over the weekend, she felt a burning sensation in that location. 08/27/2022: The 2 small wounds proximal to the main wound have merged into a single site. The wounds look a little bit dry, but they are quite clean without significant slough accumulation. They remain quite tender. 09/03/2022: All of the wounds have now merged into 1  large wound. There is a strong odor coming from the wound and the surface is not particularly viable. It is extremely painful for her today. 09/10/2022: The wound is less black and purple this week but still does not look particularly viable. The surface is desiccated. The culture that I took last week returned with a polymicrobial population including Pseudomonas. I prescribed both Augmentin and levofloxacin. She has not yet picked up levofloxacin, as her pharmacy only notified her yesterday that it was ready. We have ordered a Keystone topical compounded antibiotic, but it has not yet come. 09/17/2022: The wound surface is markedly improved today. There is still an area of grayish-looking muscle but the rest appears significantly more viable. There is still a layer of slough on the surface. She still has a couple days left of oral antibiotic therapy. She has her Jodie Echevaria compounded topical antibiotic with her today. 09/24/2022: She has completed her oral antibiotic therapy. The wound surface is much cleaner today and more viable without any necrotic tissue. It is a bit desiccated, however. 10/01/2022: The moisture balance of the wound has improved. There is a layer of yellow slough on the surface, but beneath this, the wound is more pink. 10/08/2022:  The wound is smaller and cleaner today with a layer of slough present. She is having less pain. 12/18; this patient has a new wound on the right lateral ankle which apparently started after a cat scratched her. Secondarily she managed to drop a cake pan on the same area. This is very painful. The original wound was on the left posterior calf we have been using [employer] under an [employer]. The Unna boot fell down and apparently she was in for a nurse visit perhaps last week and that 1 fell down as well This patient has central venous mediated hypertension. For member correctly she has a stent placed in the left common iliac artery by Dr. Randie Heinz some years ago [facility] Iii Thurner syndrome] she also has had venous ablations and I believe a history of a DVT The patient has compression pumps at home but she does not use them 10/30/2022: The patient says that her wounds burned all week with the [facility] classic in place. She has a fair amount of slough and nonviable subcutaneous tissue present on the right ankle. The left posterior calf wound is cleaner than I have seen it to date. Both remain fairly tender. 11/14/2022: Both wounds have substantial slough and eschar accumulation. They remain extremely tender. 11/21/2022: The wounds are cleaner this week, but still have slough and eschar accumulation. She continues to describe burning pain in both wounds, but upon further questioning, she actually has burning in both of her feet that radiates up her legs and includes to the wounds. 11/28/2022: Both wounds have slough and eschar accumulation, as well as adherent silver alginate that was difficult to remove. She continues to have pain out of proportion to the extent of her wounds. Her PCP did initiate Lyrica which she started taking last night. The dose is quite low, only 25 mg, but apparently there is a plan for upward titration, assuming she tolerates the medication. 12/05/2022: The wounds are both a little  bit smaller today, but have significant slough accumulation, as usual. They remain exquisitely tender. She is now taking Lyrica twice a day. 12/19/2022: Both of her wounds look significantly better this week. There is slough buildup on both, but they are smaller. She seems to be getting a good response from Lyrica as  she is having much less pain. 12/26/2022: The wound on her right lateral ankle is nearly closed. The wound on her left posterior calf is smaller but still has a lot of slough accumulation. They remain tender. 01/02/2023: The right lateral ankle wound is down to just a couple of millimeters. It is much less tender than on prior occasions. There is minimal slough buildup. The wound on her posterior calf continues to contract, as well. It has thick slough buildup and remains fairly tender. 01/09/2023: The right lateral ankle wound is nearly closed. He remains tender. There is just a little eschar on the surface. The wound on her posterior calf has improved quite a bit. There is still slough on the surface, but the more proximal area has epithelialized substantially and there is no longer any gray discoloration to her tissue. [name], [name] ([id number]) [id number] Page 8 of 12 3/13; patient presents for follow-up. Her right lateral ankle wound is almost healed. She has a wound to her left posterior calf with slough and granulation tissue. We have been using Santyl under Unna boots bilaterally to the lower extremities. She has no issues or complaints today. 01/30/2023: The right lateral ankle wound is down to just a pinhole under a layer of eschar. The left posterior calf wound is quite a bit improved since the last time I saw it. There is still a layer of slough on the surface but there has been more epithelialization. 02/07/2023: The right lateral ankle wound still has some eschar on the surface. The left posterior calf wound is about the same size, but with more areas  of epithelialization. 02/20/2023: The right lateral ankle wound is healed. The left posterior calf wound is smaller and is essentially flush with the surrounding skin, but the surface is quite dry. Her leg wrap slipped and her calf is quite swollen above the level of the wound. 03/01/2023: The right lateral ankle wound reopened and she also suffered a fall that resulted in a small wound on her right anterior tibial surface. The left posterior calf wound has a much better moisture balance today. There is a layer of slough on the surface. The [employer] worked much better for her as far as compression this week. 03/06/2023: The right lateral ankle wound has a layer of eschar on the surface and is too painful for me to debride. The anterior tibial surface wound is more or less healed; it is a very superficial abrasion at this point. The left posterior calf wound has a layer of slough on the surface. Her edema control is better. Patient History Information obtained from Patient. Family History Cancer - Mother,Paternal Grandparents, Diabetes - Siblings, Heart Disease - Father, Hypertension - Father. Social History Former smoker - quit in 1967. Medical History Eyes Patient has history of Cataracts - L eye Ear/Nose/Mouth/Throat Denies history of Chronic sinus problems/congestion, Middle ear problems Hematologic/Lymphatic Denies history of Anemia, Human Immunodeficiency Virus, Lymphedema Respiratory Patient has history of Asthma Cardiovascular Patient has history of Hypertension Endocrine Denies history of Type I Diabetes, Type II Diabetes Neurologic Patient has history of Neuropathy - Feet and finger Denies history of Seizure Disorder Oncologic Patient has history of Received Chemotherapy - 2012, Received Radiation - 2012 Hospitalization/Surgery History - Lower extremity venography 2020. - Intravascular ultrasound. - peripheral vascular intervention. - melanoma 2011. 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 no acute distress. Vitals Time Taken: 1:49 AM, Height: 63 in, Weight: 200 lbs, BMI: 35.4, Temperature: 97.9 F, Pulse: 71 bpm, Respiratory Rate: 20 breaths/min, Blood Pressure: 148/72 mmHg. Respiratory Normal work of breathing on room air. General Notes: 03/06/2023: The right lateral ankle wound has a layer of eschar on the surface and is too painful for me to debride. The anterior tibial surface wound is more or less healed; it is a very superficial abrasion at this point. The left posterior calf wound has a layer of slough on the surface. Her edema control is better. Integumentary (Hair, Skin) Wound #2 status is Open. Original cause of wound was Blister. The date acquired was: 07/06/2022. The wound has been in treatment 31 weeks. The wound is [name], [name] ([id number]) [id number] Page 9 of 12 located on the Left,Posterior Lower Leg. The wound measures 3.5cm length x 2cm width x 0.1cm depth; 5.498cm^2 area and 0.55cm^3 volume. There is Fat Layer (Subcutaneous Tissue) exposed. There is a medium amount of serosanguineous drainage noted. The wound margin is distinct with the outline attached to the wound base. There is medium (34-66%) red, pink, hyper - granulation within the wound bed. There is a medium (34-66%) amount of necrotic tissue within the wound bed including Adherent Slough. The periwound skin appearance had no abnormalities noted for texture. The periwound skin appearance had no abnormalities noted for moisture. The periwound skin appearance had no abnormalities noted for color. Periwound temperature was noted as No Abnormality. The periwound has tenderness on palpation. Wound #4  status is Open. Original cause of wound was Trauma. The date acquired was: 02/28/2023. The wound is located on the Right,Anterior Lower Leg. The wound measures 0.3cm length x 0.2cm width x 0.1cm depth; 0.047cm^2 area and 0.005cm^3 volume. There is Fat Layer (Subcutaneous Tissue) exposed. There is a small amount of sanguinous drainage noted. There is large (67-100%) red, hyper - granulation within the wound bed. There is no necrotic tissue within the wound bed. The periwound skin appearance had no abnormalities noted for texture. The periwound skin appearance had no abnormalities noted for moisture. The periwound skin appearance exhibited: Hemosiderin Staining. The periwound skin appearance did not exhibit: Atrophie Blanche, Cyanosis, Ecchymosis, Mottled, Pallor, Rubor, Erythema. Periwound temperature was noted as No Abnormality. Wound #5 status is Open. Original cause of wound was Trauma. The date acquired was: 02/28/2023. The wound is located on the Right,Lateral Ankle. The wound measures 0.1cm length x 0.1cm width x 0.1cm depth; 0.008cm^2 area and 0.001cm^3 volume. There is Fat Layer (Subcutaneous Tissue) exposed. There is a small amount of serous drainage noted. There is large (67-100%) pink granulation within the wound bed. There is a small (1-33%) amount of necrotic tissue within the wound bed including Eschar and Adherent Slough. The periwound skin appearance had no abnormalities noted for texture. The periwound skin appearance had no abnormalities noted for moisture. The periwound skin appearance had no abnormalities noted for color. Periwound temperature was noted as No Abnormality. Assessment Active Problems ICD-10 Non-pressure chronic ulcer of other part of left lower leg with fat layer exposed Cellulitis of right lower limb Non-pressure chronic ulcer of other part of right lower leg with other specified severity Varicose veins of bilateral lower extremities with other complications Venous  insufficiency (chronic) (peripheral) Polyneuropathy, unspecified Localized edema Procedures Wound #2 Pre-procedure diagnosis of Wound #2 is a Venous Leg Ulcer located on the Left,Posterior Lower Leg .Severity of Tissue Pre Debridement is: Fat layer exposed. There  was a Selective/Open Wound Non-Viable Tissue Debridement with a total area of 5.5 sq cm performed by Duanne Guess, MD. With the following instrument(s): Curette to remove Non-Viable tissue/material. Material removed includes [facility] after achieving pain control using Lidocaine 4% Topical Solution. No specimens were taken. A time out was conducted at 14:34, prior to the start of the procedure. A Minimum amount of bleeding was controlled with Pressure. The procedure was tolerated well with a pain level of 3 throughout and a pain level of 3 following the procedure. Post Debridement Measurements: 3.5cm length x 2cm width x 0.1cm depth; 0.55cm^3 volume. Character of Wound/Ulcer Post Debridement is improved. Severity of Tissue Post Debridement is: Fat layer exposed. Post procedure Diagnosis Wound #2: Same as Pre-Procedure General Notes: scribed for Dr. Lady Gary by Brenton Grills RN, RN. Plan Follow-up Appointments: Return Appointment in 1 week. - Dr. Lady Gary Room 3 Anesthetic: Wound #2 Left,Posterior Lower Leg: (In clinic) Topical Lidocaine 4% applied to wound bed Bathing/ Shower/ Hygiene: May shower with protection but do not get wound dressing(s) wet. Protect dressing(s) with water repellant cover (for example, large plastic bag) or a cast cover and may then take shower. Edema Control - Lymphedema / SCD / Other: Lymphedema Pumps. Use Lymphedema pumps on leg(s) 2-3 times a day for 45-60 minutes. If wearing any wraps or hose, do not remove them. Continue exercising as instructed. - Use x1 per day Avoid standing for long periods of time. Exercise regularly Off-Loading: Wound #2 Left,Posterior Lower Leg: Other: - Elevate legs at heart  level or above heart level while sitting. Wound #4 Right,Anterior Lower Leg: Other: - Elevate legs at heart level or above heart level while sitting. Wound #5 Right,Lateral Ankle: Other: - Elevate legs at heart level or above heart level while sitting. WOUND #2: - Lower Leg Wound Laterality: Left, Posterior Cleanser: Soap and Water 1 x Per Week/30 Days Discharge Instructions: May shower and wash wound with dial antibacterial soap and water prior to dressing change. Peri-Wound Care: Sween Lotion (Moisturizing lotion) 1 x Per Week/30 Days Discharge Instructions: Apply moisturizing lotion as directed Topical: Keystone 1 x Per Week/30 Days Discharge Instructions: On hold 03/01/23 [name], [name] ([id number]) [id number] Page 10 of 12 Prim Dressing: Santyl Ointment 1 x Per Week/30 Days ary Discharge Instructions: Apply nickel thick amount to wound bed as instructed Secondary Dressing: T Non-adherent Dressing, 2x3 in 1 x Per Week/30 Days elfa Discharge Instructions: Apply over primary dressing as directed. Secondary Dressing: Zetuvit Plus 4x8 in 1 x Per Week/30 Days Discharge Instructions: Apply over primary dressing as directed. Secured With: 73M Medipore Scientist, research (life sciences) Surgical T 2x10 (in/yd) 1 x Per Week/30 Days ape Discharge Instructions: Secure with tape as directed. Com pression Wrap: Unnaboot w/Calamine, 4x10 (in/yd) 1 x Per Week/30 Days Discharge Instructions: Apply Unnaboot as directed. WOUND #4: - Lower Leg Wound Laterality: Right, Anterior Cleanser: Vashe 5.8 (oz) 1 x Per Week/30 Days Discharge Instructions: Cleanse the wound with Vashe prior to applying a clean dressing using gauze sponges, not tissue or cotton balls. Cleanser: Wound Cleanser 1 x Per Week/30 Days Discharge Instructions: Cleanse the wound with wound cleanser prior to applying a clean dressing using gauze sponges, not tissue or cotton balls. Prim Dressing: Maxorb Extra Calcium Alginate, 2x2  (in/in) 1 x Per Week/30 Days ary Discharge Instructions: Apply to wound bed as instructed Secondary Dressing: Woven Gauze Sponge, Non-Sterile 4x4 in 1 x Per Week/30 Days Discharge Instructions: Apply over primary dressing as directed. Com pression Wrap: Unnaboot w/Calamine, 4x10 (  in/yd) 1 x Per Week/30 Days Discharge Instructions: Apply Unnaboot as directed. Com pression Wrap: Stockinette 1 x Per Week/30 Days WOUND #5: - Ankle Wound Laterality: Right, Lateral Cleanser: Vashe 5.8 (oz) 1 x Per Week/30 Days Discharge Instructions: Cleanse the wound with Vashe prior to applying a clean dressing using gauze sponges, not tissue or cotton balls. Cleanser: Wound Cleanser 1 x Per Week/30 Days Discharge Instructions: Cleanse the wound with wound cleanser prior to applying a clean dressing using gauze sponges, not tissue or cotton balls. Prim Dressing: Maxorb Extra Calcium Alginate, 2x2 (in/in) 1 x Per Week/30 Days ary Discharge Instructions: Apply to wound bed as instructed Secondary Dressing: Woven Gauze Sponge, Non-Sterile 4x4 in 1 x Per Week/30 Days Discharge Instructions: Apply over primary dressing as directed. Com pression Wrap: Unnaboot w/Calamine, 4x10 (in/yd) 1 x Per Week/30 Days Discharge Instructions: Apply Unnaboot as directed. Com pression Wrap: Stockinette 1 x Per Week/30 Days 03/06/2023: The right lateral ankle wound has a layer of eschar on the surface and is too painful for me to debride. The anterior tibial surface wound is more or less healed; it is a very superficial abrasion at this point. The left posterior calf wound has a layer of slough on the surface. Her edema control is better. I attempted to debride the eschar from the right lateral ankle wound, but it was too sensitive and she could not tolerate it. We will apply silver alginate to the site and an Unna boot for compression. I was able to debride slough from the left posterior calf wound. We will continue Santyl in this  location for continued enzymatic debridement. Continue to hold her Keystone topical antibiotic compound for now. Continue Unna boot compression on this leg as well. Follow-up in 1 week. Electronic Signature(s) Signed: 03/06/2023 3:03:01 PM By: Duanne Guess MD FACS Entered By: Duanne Guess on 03/06/2023 15:03:00 -------------------------------------------------------------------------------- HxROS Details Patient Name: Date of Service: [name], [name] 03/06/2023 2:00 PM Medical Record Number: [id number] Patient Account Number: [id number] Date of Birth/Sex: Treating RN: 04/06/1946 (77 y.o. F) Primary Care Provider: Eustaquio Boyden Other Clinician: Referring Provider: Treating Provider/Extender: Priscille Loveless in Treatment: 31 Information Obtained From Patient Eyes Medical History: Positive for: Cataracts - L eye Ear/Nose/Mouth/Throat Medical History: Negative for: Chronic sinus problems/congestion; Middle ear problems Hematologic/Lymphatic Medical History: Negative for: Anemia; Human Immunodeficiency Virus; Lymphedema RHELDA, HEITZMANN ([id number]) [id number] Page 11 of 12 Respiratory Medical History: Positive for: Asthma Past Medical History Notes: OSA on CPAP Cardiovascular Medical History: Positive for: Hypertension Past Medical History Notes: Cronic venous insufficiency 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 Gastrointestinal Medical History: Past Medical History Notes: liver cyst Endocrine Medical History: Negative for: Type I Diabetes; Type II Diabetes Musculoskeletal Medical History: Past Medical History Notes: arthritis Neurologic Medical History: Positive for: Neuropathy - Feet and finger Negative for: Seizure Disorder Past Medical History Notes: restless leg syndrome Oncologic Medical History: Positive for: Received Chemotherapy - 2012; Received Radiation - 2012 HBO  Extended History Items Eyes: Cataracts Immunizations Pneumococcal Vaccine: Received Pneumococcal Vaccination: Yes Received Pneumococcal Vaccination On or After 60th Birthday: Yes Implantable Devices No devices added Hospitalization / Surgery History Type of Hospitalization/Surgery Lower extremity venography 2020 Intravascular ultrasound peripheral vascular intervention melanoma 2011 Family and Social History Cancer: Yes - Mother,Paternal Grandparents; Diabetes: Yes - Siblings; Heart Disease: Yes - Father; Hypertension: Yes - Father; Former smoker - quit in [employer]) Signed: 03/06/2023 4:34:50 PM By: Duanne Guess MD FACS Entered By: Duanne Guess on 03/06/2023  15:01:25 BENNITA, TROUTWINE ([id number]) [id number] Page 12 of 12 -------------------------------------------------------------------------------- SuperBill Details Patient Name: Date of Service: SEKAI, KULLA 03/06/2023 Medical Record Number: [id number] Patient Account Number: [id number] Date of Birth/Sex: Treating RN: 10/03/1946 (77 y.o. F) Primary Care Provider: Eustaquio Boyden Other Clinician: Referring Provider: Treating Provider/Extender: Priscille Loveless in Treatment: 31 Diagnosis Coding ICD-10 Codes Code Description [phone] Non-pressure chronic ulcer of other part of left lower leg with fat layer exposed L03.115 Cellulitis of right lower limb L97.818 Non-pressure chronic ulcer of other part of right lower leg with other specified severity I83.893 Varicose veins of bilateral lower extremities with other complications I87.2 Venous insufficiency (chronic) (peripheral) G62.9 Polyneuropathy, unspecified R60.0 Localized edema Facility Procedures : CPT4 Code: [id number] Description: 97597 - DEBRIDE WOUND 1ST 20 SQ CM OR < ICD-10 Diagnosis Description L97.822 Non-pressure chronic ulcer of other part of left lower leg with fat layer  exposed Modifier: Quantity: 1 Physician Procedures : CPT4 Code Description Modifier [id number] - WC PHYS LEVEL 4 - EST PT 25 ICD-10 Diagnosis Description L97.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d L97.818 Non-pressure chronic ulcer of other part of right  lower leg with other specified severity I83.893 Varicose veins of bilateral lower extremities with other complications G62.9 Polyneuropathy, unspecified Quantity: 1 : [id number] 97597 - WC PHYS DEBR WO ANESTH 20 SQ CM ICD-10 Diagnosis Description L97.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d Quantity: 1 Electronic Signature(s) Signed: 03/06/2023 3:03:25 PM By: Duanne Guess MD FACS Entered By: Duanne Guess on 03/06/2023 15:03:24

## 2023-04-17 ENCOUNTER — Encounter (HOSPITAL_BASED_OUTPATIENT_CLINIC_OR_DEPARTMENT_OTHER): Payer: Medicare Other | Admitting: General Surgery

## 2023-04-17 DIAGNOSIS — L03115 Cellulitis of right lower limb: Secondary | ICD-10-CM | POA: Diagnosis not present

## 2023-04-17 DIAGNOSIS — L97222 Non-pressure chronic ulcer of left calf with fat layer exposed: Secondary | ICD-10-CM | POA: Diagnosis not present

## 2023-04-17 DIAGNOSIS — I872 Venous insufficiency (chronic) (peripheral): Secondary | ICD-10-CM | POA: Diagnosis not present

## 2023-04-17 DIAGNOSIS — G629 Polyneuropathy, unspecified: Secondary | ICD-10-CM | POA: Diagnosis not present

## 2023-04-17 DIAGNOSIS — R600 Localized edema: Secondary | ICD-10-CM | POA: Diagnosis not present

## 2023-04-17 DIAGNOSIS — I83893 Varicose veins of bilateral lower extremities with other complications: Secondary | ICD-10-CM | POA: Diagnosis not present

## 2023-04-17 DIAGNOSIS — L97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d: Secondary | ICD-10-CM | POA: Diagnosis not present

## 2023-04-18 ENCOUNTER — Telehealth: Payer: Self-pay | Admitting: Family Medicine

## 2023-04-18 NOTE — Progress Notes (Addendum)
GUADELUPE, BREVIG ([id number]) [id number] Page 1 of 11 Visit Report for 04/17/2023 Chief Complaint Document Details Patient Name: Date of Service: [name], [name] 04/17/2023 2:00 PM Medical Record Number: [id number] Patient Account Number: [id number] Date of Birth/Sex: Treating RN: November 03, 1946 (78 y.o. F) Primary Care Provider: Eustaquio Boyden Other Clinician: Referring Provider: Treating Provider/Extender: Priscille Loveless in Treatment: 37 Information Obtained from: Patient Chief Complaint Patient presents for treatment of an open ulcer due to venous insufficiency Electronic Signature(s) Signed: 04/17/2023 2:38:45 PM By: Duanne Guess MD FACS Entered By: Duanne Guess on 04/17/2023 14:38:45 -------------------------------------------------------------------------------- Debridement Details Patient Name: Date of Service: [name] 04/17/2023 2:00 PM Medical Record Number: [id number] Patient Account Number: [id number] Date of Birth/Sex: Treating RN: 06-Oct-1946 (77 y.o. [name] Primary Care Provider: Eustaquio Boyden Other Clinician: Referring Provider: Treating Provider/Extender: Priscille Loveless in Treatment: 37 Debridement Performed for Assessment: Wound #2 Left,Posterior Lower Leg Performed By: Physician Duanne Guess, MD Debridement Type: Debridement Severity of Tissue Pre Debridement: Fat layer exposed Level of Consciousness (Pre-procedure): Awake and Alert Pre-procedure Verification/Time Out Yes - 14:32 Taken: Start Time: 14:33 Pain Control: Lidocaine 4% T opical Solution Percent of Wound Bed Debrided: 100% T Area Debrided (cm): otal 9.42 Tissue and other material debrided: Non-Viable, Slough, Slough Level: Non-Viable Tissue Debridement Description: Selective/Open Wound Instrument: Curette Bleeding: Minimum Hemostasis Achieved: Pressure Response to Treatment: Procedure was  tolerated well Level of Consciousness (Post- Awake and Alert procedure): Post Debridement Measurements of Total Wound Length: (cm) 4.8 Width: (cm) 2.5 Depth: (cm) 0.2 Volume: (cm) 1.885 Character of Wound/Ulcer Post Debridement: Requires Further Debridement Severity of Tissue Post Debridement: Fat layer exposed Post Procedure Diagnosis [name], [name] ([id number]) [id number] Page 2 of 11 Same as Pre-procedure Notes Scribed for Dr. Lady Gary by Tommie Ard, RN Electronic Signature(s) Signed: 04/17/2023 2:46:20 PM By: Duanne Guess MD FACS Signed: 04/17/2023 2:59:55 PM By: Tommie Ard RN Entered By: Tommie Ard on 04/17/2023 14:36:12 -------------------------------------------------------------------------------- HPI Details Patient Name: Date of Service: [name], [name] 04/17/2023 2:00 PM Medical Record Number: [id number] Patient Account Number: [id number] Date of Birth/Sex: Treating RN: 02-Aug-1946 (77 y.o. F) Primary Care Provider: Eustaquio Boyden Other Clinician: Referring Provider: Treating Provider/Extender: Priscille Loveless in Treatment: 37 History of Present Illness HPI Description: ADMISSION This is a 77 year old woman with a history of chronic venous insufficiency status post saphenous vein ablations in 2010 and 2016. 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 She presents today with an open venous ulcer on her left lower leg. It has been present for a little over 2 weeks. She saw her primary care provider who apparently swabbed the wound and grew out Pseudomonas. She just completed a course of ciprofloxacin for this. ABI was 0.91. She reports that she has had previous issues with ulcers in this same location, stemming back to a punch biopsy taken by a dermatologist many years ago. She has had several skin substitutes applied to the area that have ultimately resulted in healing on prior  occasions. She has 2 small ulcers on her left medial lower leg. There is slough accumulation in both of them. The more anterior of the 2 is quite small and has some soft slough on the surface, underneath which there is good granulation tissue. The more medial wound also has slough accumulation, but the underlying surface is fairly fibrotic and gritty. This is consistent with her provided history of multiple ulcers in the same location. 08/03/2022: The anterior wound  is smaller today with just a little bit of slough accumulation. The more medial wound continues to be very sensitive and fibrotic with slough buildup. 08/13/2022: The anterior wound is closed. The more medial wound remains sensitive with a fairly fibrotic surface. There is more granulation tissue filling in, however, and there is less slough than on prior occasions. 08/20/2022: The anterior wound remains closed. The more medial wound has filled with granulation tissue but still has a fair amount of slough on the surface and remains fairly tender. Unfortunately, she has developed 2 small ulcers just proximal to this. The fat layer is exposed in each. She says that over the weekend, she felt a burning sensation in that location. 08/27/2022: The 2 small wounds proximal to the main wound have merged into a single site. The wounds look a little bit dry, but they are quite clean without significant slough accumulation. They remain quite tender. 09/03/2022: All of the wounds have now merged into 1 large wound. There is a strong odor coming from the wound and the surface is not particularly viable. It is extremely painful for her today. 09/10/2022: The wound is less black and purple this week but still does not look particularly viable. The surface is desiccated. The culture that I took last week returned with a polymicrobial population including Pseudomonas. I prescribed both Augmentin and levofloxacin. She has not yet picked up levofloxacin, as  her pharmacy only notified her yesterday that it was ready. We have ordered a Keystone topical compounded antibiotic, but it has not yet come. 09/17/2022: The wound surface is markedly improved today. There is still an area of grayish-looking muscle but the rest appears significantly more viable. There is still a layer of slough on the surface. She still has a couple days left of oral antibiotic therapy. She has her Jodie Echevaria compounded topical antibiotic with her today. 09/24/2022: She has completed her oral antibiotic therapy. The wound surface is much cleaner today and more viable without any necrotic tissue. It is a bit desiccated, however. 10/01/2022: The moisture balance of the wound has improved. There is a layer of yellow slough on the surface, but beneath this, the wound is more pink. 10/08/2022: The wound is smaller and cleaner today with a layer of slough present. She is having less pain. 12/18; this patient has a new wound on the right lateral ankle which apparently started after a cat scratched her. Secondarily she managed to drop a cake pan on the same area. This is very painful. The original wound was on the left posterior calf we have been using [employer] under an [employer]. The Unna boot fell down and apparently she was in for a nurse visit perhaps last week and that 1 fell down as well This patient has central venous mediated hypertension. For member correctly she has a stent placed in the left common iliac artery by Dr. Randie Heinz some years ago [facility] Thurner syndrome] she also has had venous ablations and I believe a history of a DVT The patient has compression pumps at home but she does not use them 10/30/2022: The patient says that her wounds burned all week with the [facility] classic in place. She has a fair amount of slough and nonviable [name], [name] ([id number]) [id number] Page 3 of 11 subcutaneous tissue present on the right ankle. The left  posterior calf wound is cleaner than I have seen it to date. Both remain fairly tender. 11/14/2022: Both wounds have substantial slough and eschar accumulation.  They remain extremely tender. 11/21/2022: The wounds are cleaner this week, but still have slough and eschar accumulation. She continues to describe burning pain in both wounds, but upon further questioning, she actually has burning in both of her feet that radiates up her legs and includes to the wounds. 11/28/2022: Both wounds have slough and eschar accumulation, as well as adherent silver alginate that was difficult to remove. She continues to have pain out of proportion to the extent of her wounds. Her PCP did initiate Lyrica which she started taking last night. The dose is quite low, only 25 mg, but apparently there is a plan for upward titration, assuming she tolerates the medication. 12/05/2022: The wounds are both a little bit smaller today, but have significant slough accumulation, as usual. They remain exquisitely tender. She is now taking Lyrica twice a day. 12/19/2022: Both of her wounds look significantly better this week. There is slough buildup on both, but they are smaller. She seems to be getting a good response from Lyrica as she is having much less pain. 12/26/2022: The wound on her right lateral ankle is nearly closed. The wound on her left posterior calf is smaller but still has a lot of slough accumulation. They remain tender. 01/02/2023: The right lateral ankle wound is down to just a couple of millimeters. It is much less tender than on prior occasions. There is minimal slough buildup. The wound on her posterior calf continues to contract, as well. It has thick slough buildup and remains fairly tender. 01/09/2023: The right lateral ankle wound is nearly closed. He remains tender. There is just a little eschar on the surface. The wound on her posterior calf has improved quite a bit. There is still slough on the surface, but the  more proximal area has epithelialized substantially and there is no longer any gray discoloration to her tissue. 3/13; patient presents for follow-up. Her right lateral ankle wound is almost healed. She has a wound to her left posterior calf with slough and granulation tissue. We have been using Santyl under Unna boots bilaterally to the lower extremities. She has no issues or complaints today. 01/30/2023: The right lateral ankle wound is down to just a pinhole under a layer of eschar. The left posterior calf wound is quite a bit improved since the last time I saw it. There is still a layer of slough on the surface but there has been more epithelialization. 02/07/2023: The right lateral ankle wound still has some eschar on the surface. The left posterior calf wound is about the same size, but with more areas of epithelialization. 02/20/2023: The right lateral ankle wound is healed. The left posterior calf wound is smaller and is essentially flush with the surrounding skin, but the surface is quite dry. Her leg wrap slipped and her calf is quite swollen above the level of the wound. 03/01/2023: The right lateral ankle wound reopened and she also suffered a fall that resulted in a small wound on her right anterior tibial surface. The left posterior calf wound has a much better moisture balance today. There is a layer of slough on the surface. The [employer] worked much better for her as far as compression this week. 03/06/2023: The right lateral ankle wound has a layer of eschar on the surface and is too painful for me to debride. The anterior tibial surface wound is more or less healed; it is a very superficial abrasion at this point. The left posterior calf wound has a layer  of slough on the surface. Her edema control is better. 03/13/2023: The right lateral ankle wound has a layer of eschar on the surface. Once this was debrided, it was found to be healed. The anterior tibial surface wound has a layer of  stuck silver alginate on the surface. This was removed to reveal a very superficial abrasion but not much change from last week. The left posterior calf wound is smaller and has its usual layer of accumulated slough. 03/25/2023: The anterior tibial surface wound has eschar and silver alginate stuck to it. Once this was removed, the wound was found to be healed. The left posterior calf wound is roughly the same size and has a little bit thicker layer of accumulated slough. 04/03/2023: She has had more drainage from the posterior calf wound over the last week and the layer of slough is thicker. She also says it is more tender. 04/10/2023: Continued increase in drainage from her wound. The wound remains tender with a layer of slough on the surface. It does not appear as viable this week. She also has erythema, swelling, and warmth in her entire left lower leg extending into the midfoot. 04/17/2023: The wound is about the same size this week. There is fibrotic adherent slough, but the drainage is not as profuse. She is currently taking levofloxacin. She is complaining of an increase in the burning sensation at her wound. Electronic Signature(s) Signed: 04/17/2023 2:40:42 PM By: Duanne Guess MD FACS Entered By: Duanne Guess on 04/17/2023 14:40:42 -------------------------------------------------------------------------------- Physical Exam Details Patient Name: Date of Service: [name], [name] 04/17/2023 2:00 PM Medical Record Number: [id number] Patient Account Number: [id number] Date of Birth/Sex: Treating RN: January 19, 1946 (77 y.o. F) Primary Care Provider: Eustaquio Boyden Other Clinician: Referring Provider: Treating Provider/Extender: Priscille Loveless in Treatment: 37 Constitutional Hypertensive, asymptomatic. . . . no acute distress. [name], [name] ([id number]) [id number] Page 4 of 11 Respiratory Normal work of breathing on room  air. Notes 04/17/2023: The wound is about the same size this week. There is fibrotic adherent slough, but the drainage is not as profuse. She is complaining of an increase in the burning sensation at her wound. Electronic Signature(s) Signed: 04/17/2023 2:43:02 PM By: Duanne Guess MD FACS Entered By: Duanne Guess on 04/17/2023 14:43:02 -------------------------------------------------------------------------------- Physician Orders Details Patient Name: Date of Service: [name], [name] 04/17/2023 2:00 PM Medical Record Number: [id number] Patient Account Number: [id number] Date of Birth/Sex: Treating RN: [birth year] (77 y.o. Roselee Nova, [name] Primary Care Provider: Eustaquio Boyden Other Clinician: Referring Provider: Treating Provider/Extender: Priscille Loveless in Treatment: [phone] Verbal / Phone Orders: No Diagnosis Coding ICD-10 Coding Code Description [phone] Non-pressure chronic ulcer of other part of left lower leg with fat layer exposed L03.115 Cellulitis of right lower limb I83.893 Varicose veins of bilateral lower extremities with other complications I87.2 Venous insufficiency (chronic) (peripheral) G62.9 Polyneuropathy, unspecified R60.0 Localized edema Follow-up Appointments ppointment in 1 week. - Dr. Lady Gary Room 3 Return A Request an increase in Lyrica dose Anesthetic Wound #2 Left,Posterior Lower Leg (In clinic) Topical Lidocaine 4% applied to wound bed Bathing/ Shower/ Hygiene May shower with protection but do not get wound dressing(s) wet. Protect dressing(s) with water repellant cover (for example, large plastic bag) or a cast cover and may then take shower. Edema Control - Lymphedema / SCD / Other Left Lower Extremity Lymphedema Pumps. Use Lymphedema pumps on leg(s) 2-3 times a day for 45-60 minutes. If wearing any wraps or hose, do not remove them. Continue  exercising as instructed. - Use x1 per day Avoid standing for long periods of  time. Exercise regularly Off-Loading Wound #2 Left,Posterior Lower Leg Other: - Elevate legs at heart level or above heart level while sitting. Wound Treatment Wound #2 - Lower Leg Wound Laterality: Left, Posterior Cleanser: Soap and Water 1 x Per Week/30 Days Discharge Instructions: May shower and wash wound with dial antibacterial soap and water prior to dressing change. Peri-Wound Care: Sween Lotion (Moisturizing lotion) 1 x Per Week/30 Days Discharge Instructions: Apply moisturizing lotion as directed Topical: Gentamicin 1 x Per Week/30 Days Discharge Instructions: As directed by physician [name], PENDLETON ([id number]) [id number] Page 5 of 11 Topical: Mupirocin Ointment 1 x Per Week/30 Days Discharge Instructions: Apply Mupirocin (Bactroban) as instructed Topical: Keystone 1 x Per Week/30 Days Discharge Instructions: On hold since 03/01/23 Prim Dressing: Santyl Ointment 1 x Per Week/30 Days ary Discharge Instructions: Apply nickel thick amount to wound bed as instructed Secondary Dressing: T Non-adherent Dressing, 2x3 in 1 x Per Week/30 Days elfa Discharge Instructions: Apply over primary dressing as directed. Secondary Dressing: Zetuvit Plus 4x8 in 1 x Per Week/30 Days Discharge Instructions: Apply over primary dressing as directed. Secured With: 31M Medipore Scientist, research (life sciences) Surgical T 2x10 (in/yd) 1 x Per Week/30 Days ape Discharge Instructions: Secure with tape as directed. Compression Wrap: Unnaboot w/Calamine, 4x10 (in/yd) 1 x Per Week/30 Days Discharge Instructions: Apply Unnaboot as directed. Electronic Signature(s) Signed: 04/17/2023 2:46:20 PM By: Duanne Guess MD FACS Entered By: Duanne Guess on 04/17/2023 14:43:42 -------------------------------------------------------------------------------- Problem List Details Patient Name: Date of Service: KATORA, FADDIS 04/17/2023 2:00 PM Medical Record Number: [id number] Patient Account Number:  [id number] Date of Birth/Sex: Treating RN: 1945/11/28 (77 y.o. F) Primary Care Provider: Eustaquio Boyden Other Clinician: Referring Provider: Treating Provider/Extender: Priscille Loveless in Treatment: 37 Active Problems ICD-10 Encounter Code Description Active Date MDM Diagnosis [phone] Non-pressure chronic ulcer of other part of left lower leg with fat layer exposed9/22/2023 No Yes L03.115 Cellulitis of right lower limb 10/22/2022 No Yes I83.893 Varicose veins of bilateral lower extremities with other complications 07/27/2022 No Yes I87.2 Venous insufficiency (chronic) (peripheral) 07/27/2022 No Yes G62.9 Polyneuropathy, unspecified 07/27/2022 No Yes R60.0 Localized edema 07/27/2022 No Yes [name], [name] ([id number]) [id number] Page 6 of 11 Inactive Problems ICD-10 Code Description Active Date Inactive Date L97.818 Non-pressure chronic ulcer of other part of right lower leg with other specified severity 03/01/2023 10/22/2022 Resolved Problems Electronic Signature(s) Signed: 04/17/2023 2:38:31 PM By: Duanne Guess MD FACS Entered By: Duanne Guess on 04/17/2023 14:38:31 -------------------------------------------------------------------------------- Progress Note Details Patient Name: Date of Service: [name]. 04/17/2023 2:00 PM Medical Record Number: [id number] Patient Account Number: [id number] Date of Birth/Sex: Treating RN: 05-Oct-1946 (77 y.o. F) Primary Care Provider: Eustaquio Boyden Other Clinician: Referring Provider: Treating Provider/Extender: Priscille Loveless in Treatment: 37 Subjective Chief Complaint Information obtained from Patient Patient presents for treatment of an open ulcer due to venous insufficiency History of Present Illness (HPI) ADMISSION This is a 77 year old woman with a history of chronic venous insufficiency status post saphenous vein ablations in 2010 and 2016.  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 She presents today with an open venous ulcer on her left lower leg. It has been present for a little over 2 weeks. She saw her primary care provider who apparently swabbed the wound and grew out Pseudomonas. She just completed a course of ciprofloxacin for this. ABI was 0.91. She reports that she  has had previous issues with ulcers in this same location, stemming back to a punch biopsy taken by a dermatologist many years ago. She has had several skin substitutes applied to the area that have ultimately resulted in healing on prior occasions. She has 2 small ulcers on her left medial lower leg. There is slough accumulation in both of them. The more anterior of the 2 is quite small and has some soft slough on the surface, underneath which there is good granulation tissue. The more medial wound also has slough accumulation, but the underlying surface is fairly fibrotic and gritty. This is consistent with her provided history of multiple ulcers in the same location. 08/03/2022: The anterior wound is smaller today with just a little bit of slough accumulation. The more medial wound continues to be very sensitive and fibrotic with slough buildup. 08/13/2022: The anterior wound is closed. The more medial wound remains sensitive with a fairly fibrotic surface. There is more granulation tissue filling in, however, and there is less slough than on prior occasions. 08/20/2022: The anterior wound remains closed. The more medial wound has filled with granulation tissue but still has a fair amount of slough on the surface and remains fairly tender. Unfortunately, she has developed 2 small ulcers just proximal to this. The fat layer is exposed in each. She says that over the weekend, she felt a burning sensation in that location. 08/27/2022: The 2 small wounds proximal to the main wound have merged into a single site. The wounds look a little bit  dry, but they are quite clean without significant slough accumulation. They remain quite tender. 09/03/2022: All of the wounds have now merged into 1 large wound. There is a strong odor coming from the wound and the surface is not particularly viable. It is extremely painful for her today. 09/10/2022: The wound is less black and purple this week but still does not look particularly viable. The surface is desiccated. The culture that I took last week returned with a polymicrobial population including Pseudomonas. I prescribed both Augmentin and levofloxacin. She has not yet picked up levofloxacin, as her pharmacy only notified her yesterday that it was ready. We have ordered a Keystone topical compounded antibiotic, but it has not yet come. 09/17/2022: The wound surface is markedly improved today. There is still an area of grayish-looking muscle but the rest appears significantly more viable. There is still a layer of slough on the surface. She still has a couple days left of oral antibiotic therapy. She has her Jodie Echevaria compounded topical antibiotic with her today. 09/24/2022: She has completed her oral antibiotic therapy. The wound surface is much cleaner today and more viable without any necrotic tissue. It is a bit desiccated, however. 10/01/2022: The moisture balance of the wound has improved. There is a layer of yellow slough on the surface, but beneath this, the wound is more pink. 10/08/2022: The wound is smaller and cleaner today with a layer of slough present. She is having less pain. 12/18; this patient has a new wound on the right lateral ankle which apparently started after a cat scratched her. Secondarily she managed to drop a cake pan on the same area. This is very painful. [name], [name] ([id number]) [id number] Page 7 of 11 The original wound was on the left posterior calf we have been using [employer] under an [employer]. The Unna boot fell down and  apparently she was in for a nurse visit perhaps last week and that 1 fell  down as well This patient has central venous mediated hypertension. For member correctly she has a stent placed in the left common iliac artery by Dr. Randie Heinz some years ago [facility] Thurner syndrome] she also has had venous ablations and I believe a history of a DVT The patient has compression pumps at home but she does not use them 10/30/2022: The patient says that her wounds burned all week with the [facility] classic in place. She has a fair amount of slough and nonviable subcutaneous tissue present on the right ankle. The left posterior calf wound is cleaner than I have seen it to date. Both remain fairly tender. 11/14/2022: Both wounds have substantial slough and eschar accumulation. They remain extremely tender. 11/21/2022: The wounds are cleaner this week, but still have slough and eschar accumulation. She continues to describe burning pain in both wounds, but upon further questioning, she actually has burning in both of her feet that radiates up her legs and includes to the wounds. 11/28/2022: Both wounds have slough and eschar accumulation, as well as adherent silver alginate that was difficult to remove. She continues to have pain out of proportion to the extent of her wounds. Her PCP did initiate Lyrica which she started taking last night. The dose is quite low, only 25 mg, but apparently there is a plan for upward titration, assuming she tolerates the medication. 12/05/2022: The wounds are both a little bit smaller today, but have significant slough accumulation, as usual. They remain exquisitely tender. She is now taking Lyrica twice a day. 12/19/2022: Both of her wounds look significantly better this week. There is slough buildup on both, but they are smaller. She seems to be getting a good response from Lyrica as she is having much less pain. 12/26/2022: The wound on her right lateral ankle is nearly closed. The wound  on her left posterior calf is smaller but still has a lot of slough accumulation. They remain tender. 01/02/2023: The right lateral ankle wound is down to just a couple of millimeters. It is much less tender than on prior occasions. There is minimal slough buildup. The wound on her posterior calf continues to contract, as well. It has thick slough buildup and remains fairly tender. 01/09/2023: The right lateral ankle wound is nearly closed. He remains tender. There is just a little eschar on the surface. The wound on her posterior calf has improved quite a bit. There is still slough on the surface, but the more proximal area has epithelialized substantially and there is no longer any gray discoloration to her tissue. 3/13; patient presents for follow-up. Her right lateral ankle wound is almost healed. She has a wound to her left posterior calf with slough and granulation tissue. We have been using Santyl under Unna boots bilaterally to the lower extremities. She has no issues or complaints today. 01/30/2023: The right lateral ankle wound is down to just a pinhole under a layer of eschar. The left posterior calf wound is quite a bit improved since the last time I saw it. There is still a layer of slough on the surface but there has been more epithelialization. 02/07/2023: The right lateral ankle wound still has some eschar on the surface. The left posterior calf wound is about the same size, but with more areas of epithelialization. 02/20/2023: The right lateral ankle wound is healed. The left posterior calf wound is smaller and is essentially flush with the surrounding skin, but the surface is quite dry. Her leg wrap slipped and  her calf is quite swollen above the level of the wound. 03/01/2023: The right lateral ankle wound reopened and she also suffered a fall that resulted in a small wound on her right anterior tibial surface. The left posterior calf wound has a much better moisture balance today. There  is a layer of slough on the surface. The [employer] worked much better for her as far as compression this week. 03/06/2023: The right lateral ankle wound has a layer of eschar on the surface and is too painful for me to debride. The anterior tibial surface wound is more or less healed; it is a very superficial abrasion at this point. The left posterior calf wound has a layer of slough on the surface. Her edema control is better. 03/13/2023: The right lateral ankle wound has a layer of eschar on the surface. Once this was debrided, it was found to be healed. The anterior tibial surface wound has a layer of stuck silver alginate on the surface. This was removed to reveal a very superficial abrasion but not much change from last week. The left posterior calf wound is smaller and has its usual layer of accumulated slough. 03/25/2023: The anterior tibial surface wound has eschar and silver alginate stuck to it. Once this was removed, the wound was found to be healed. The left posterior calf wound is roughly the same size and has a little bit thicker layer of accumulated slough. 04/03/2023: She has had more drainage from the posterior calf wound over the last week and the layer of slough is thicker. She also says it is more tender. 04/10/2023: Continued increase in drainage from her wound. The wound remains tender with a layer of slough on the surface. It does not appear as viable this week. She also has erythema, swelling, and warmth in her entire left lower leg extending into the midfoot. 04/17/2023: The wound is about the same size this week. There is fibrotic adherent slough, but the drainage is not as profuse. She is currently taking levofloxacin. She is complaining of an increase in the burning sensation at her wound. Patient History Information obtained from Patient. Family History Cancer - Mother,Paternal Grandparents, Diabetes - Siblings, Heart Disease - Father, Hypertension - Father. Social  History Former smoker - quit in 1967. Medical History Eyes Patient has history of Cataracts - L eye Ear/Nose/Mouth/Throat Denies history of Chronic sinus problems/congestion, Middle ear problems Hematologic/Lymphatic Denies history of Anemia, Human Immunodeficiency Virus, Lymphedema Respiratory Patient has history of Asthma Cardiovascular Patient has history of Hypertension SHAKENYA, CLAUSING ([id number]) [id number] Page 8 of 11 Endocrine Denies history of Type I Diabetes, Type II Diabetes Neurologic Patient has history of Neuropathy - Feet and finger Denies history of Seizure Disorder Oncologic Patient has history of Received Chemotherapy - 2012, Received Radiation - 2012 Hospitalization/Surgery History - Lower extremity venography 2020. - Intravascular ultrasound. - peripheral vascular intervention. - melanoma 2011. 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 no acute distress. Vitals Time Taken: 2:08 AM, Height: 63 in, Weight: 200 lbs, BMI: 35.4, Temperature: 98.3 F, Pulse: 72 bpm, Respiratory Rate: 20 breaths/min, Blood Pressure: 167/81 mmHg. Respiratory Normal work of breathing on room air. General Notes: 04/17/2023: The wound is about the same size this week. There is fibrotic adherent slough, but the drainage is not as profuse. She is complaining of an increase  in the burning sensation at her wound. Integumentary (Hair, Skin) Wound #2 status is Open. Original cause of wound was Blister. The date acquired was: 07/06/2022. The wound has been in treatment 37 weeks. The wound is located on the Left,Posterior Lower Leg. The wound measures 4.8cm length x 2.5cm width x 0.2cm depth; 9.425cm^2  area and 1.885cm^3 volume. There is Fat Layer (Subcutaneous Tissue) exposed. There is no tunneling or undermining noted. There is a medium amount of serosanguineous drainage noted. The wound margin is distinct with the outline attached to the wound base. There is small (1-33%) red, hyper - granulation within the wound bed. There is a large (67-100%) amount of necrotic tissue within the wound bed including Eschar and Adherent Slough. The periwound skin appearance had no abnormalities noted for texture. The periwound skin appearance exhibited: Maceration, Rubor. The periwound skin appearance did not exhibit: Dry/Scaly. Periwound temperature was noted as No Abnormality. The periwound has tenderness on palpation. Assessment Active Problems ICD-10 Non-pressure chronic ulcer of other part of left lower leg with fat layer exposed Cellulitis of right lower limb Varicose veins of bilateral lower extremities with other complications Venous insufficiency (chronic) (peripheral) Polyneuropathy, unspecified Localized edema Procedures Wound #2 Pre-procedure diagnosis of Wound #2 is a Venous Leg Ulcer located on the Left,Posterior Lower Leg .Severity of Tissue Pre Debridement is: Fat layer exposed. There was a Selective/Open Wound Non-Viable Tissue Debridement with a total area of 9.42 sq cm performed by Duanne Guess, MD. With the following instrument(s): Curette to remove Non-Viable tissue/material. Material removed includes [facility] after achieving pain control using Lidocaine 4% Topical Solution. No specimens were taken. A time out was conducted at 14:32, prior to the start of the procedure. A Minimum amount of bleeding was controlled with Pressure. The procedure was tolerated well. Post Debridement Measurements: 4.8cm length x 2.5cm width x 0.2cm depth; 1.885cm^3 volume. Character of Wound/Ulcer Post Debridement requires further debridement. Severity of Tissue Post Debridement is: Fat layer  exposed. Post procedure Diagnosis Wound #2: Same as Pre-Procedure General Notes: Scribed for Dr. Lady Gary by Tommie Ard, RN. [name], [name] ([id number]) [id number] Page 9 of 11 Pre-procedure diagnosis of Wound #2 is a Venous Leg Ulcer located on the Left,Posterior Lower Leg . There was a Double Layer Compression Therapy Procedure by Tommie Ard, RN. Post procedure Diagnosis Wound #2: Same as Pre-Procedure Notes: Unna boot with calamine. Plan Follow-up Appointments: Return Appointment in 1 week. - Dr. Lady Gary Room 3 Request an increase in Lyrica dose Anesthetic: Wound #2 Left,Posterior Lower Leg: (In clinic) Topical Lidocaine 4% applied to wound bed Bathing/ Shower/ Hygiene: May shower with protection but do not get wound dressing(s) wet. Protect dressing(s) with water repellant cover (for example, large plastic bag) or a cast cover and may then take shower. Edema Control - Lymphedema / SCD / Other: Lymphedema Pumps. Use Lymphedema pumps on leg(s) 2-3 times a day for 45-60 minutes. If wearing any wraps or hose, do not remove them. Continue exercising as instructed. - Use x1 per day Avoid standing for long periods of time. Exercise regularly Off-Loading: Wound #2 Left,Posterior Lower Leg: Other: - Elevate legs at heart level or above heart level while sitting. WOUND #2: - Lower Leg Wound Laterality: Left, Posterior Cleanser: Soap and Water 1 x Per Week/30 Days Discharge Instructions: May shower and wash wound with dial antibacterial soap and water prior to dressing change. Peri-Wound Care: Sween Lotion (Moisturizing lotion) 1 x Per Week/30 Days Discharge Instructions: Apply moisturizing lotion as directed Topical: Gentamicin 1  x Per Week/30 Days Discharge Instructions: As directed by physician Topical: Mupirocin Ointment 1 x Per Week/30 Days Discharge Instructions: Apply Mupirocin (Bactroban) as instructed Topical: Keystone 1 x Per Week/30 Days Discharge  Instructions: On hold since 03/01/23 Prim Dressing: Santyl Ointment 1 x Per Week/30 Days ary Discharge Instructions: Apply nickel thick amount to wound bed as instructed Secondary Dressing: T Non-adherent Dressing, 2x3 in 1 x Per Week/30 Days elfa Discharge Instructions: Apply over primary dressing as directed. Secondary Dressing: Zetuvit Plus 4x8 in 1 x Per Week/30 Days Discharge Instructions: Apply over primary dressing as directed. Secured With: 61M Medipore Scientist, research (life sciences) Surgical T 2x10 (in/yd) 1 x Per Week/30 Days ape Discharge Instructions: Secure with tape as directed. Com pression Wrap: Unnaboot w/Calamine, 4x10 (in/yd) 1 x Per Week/30 Days Discharge Instructions: Apply Unnaboot as directed. 04/17/2023: The wound is about the same size this week. There is fibrotic adherent slough, but the drainage is not as profuse. She is complaining of an increase in the burning sensation at her wound. I used a curette to debride slough from the wound. I was unable to adequately debride the site secondary to patient discomfort. We will continue using Santyl for ongoing enzymatic debridement with topical gentamicin and mupirocin. I am going to add Hydrofera Blue classic to see if we can also get some mechanical debridement with removal of this dressing next week. She will complete her oral levofloxacin. In addition, she is only taking 25 mg of Lyrica twice a day; I suggested she reach out to her primary care provider to see if they are willing to increase her dose to try and help with the burning sensation she is experiencing. Will continue calamine based Unna boot. Follow-up in 1 week. Electronic Signature(s) Signed: 04/18/2023 5:31:32 PM By: Shawn Stall RN, [name] Signed: 04/19/2023 7:45:28 AM By: Duanne Guess MD FACS Previous Signature: 04/17/2023 2:45:33 PM Version By: Duanne Guess MD FACS Entered By: Shawn Stall on 04/18/2023  16:52:16 -------------------------------------------------------------------------------- HxROS Details Patient Name: Date of Service: [name], [name] 04/17/2023 2:00 PM Medical Record Number: [id number] Patient Account Number: [id number] Date of Birth/Sex: Treating RN: [birth year] (77 y.o. F) Primary Care Provider: Eustaquio Boyden Other Clinician: Referring Provider: Treating Provider/Extender: Priscille Loveless in Treatment: [address], [place] J ([id number]) [id number] Page 10 of 11 Information Obtained From Patient Eyes Medical History: Positive for: Cataracts - L eye Ear/Nose/Mouth/Throat Medical History: Negative for: Chronic sinus problems/congestion; Middle ear problems Hematologic/Lymphatic Medical History: Negative for: Anemia; Human Immunodeficiency Virus; Lymphedema Respiratory Medical History: Positive for: Asthma Past Medical History Notes: OSA on CPAP Cardiovascular Medical History: Positive for: Hypertension Past Medical History Notes: Cronic venous insufficiency 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 Gastrointestinal Medical History: Past Medical History Notes: liver cyst Endocrine Medical History: Negative for: Type I Diabetes; Type II Diabetes Musculoskeletal Medical History: Past Medical History Notes: arthritis Neurologic Medical History: Positive for: Neuropathy - Feet and finger Negative for: Seizure Disorder Past Medical History Notes: restless leg syndrome Oncologic Medical History: Positive for: Received Chemotherapy - 2012; Received Radiation - 2012 HBO Extended History Items Eyes: Cataracts Immunizations Pneumococcal Vaccine: Received Pneumococcal Vaccination: Yes Received Pneumococcal Vaccination On or After 60th Birthday: Yes Implantable Devices No devices added Hospitalization / Surgery History ANTON, JAIN ([id number])  [id number] Page 11 of 11 Type of Hospitalization/Surgery Lower extremity venography 2020 Intravascular ultrasound peripheral vascular intervention melanoma 2011 Family and Social History Cancer: Yes - Mother,Paternal Grandparents; Diabetes: Yes - Siblings; Heart Disease: Yes - Father; Hypertension: Yes -  Father; Former smoker - quit in [employer]) Signed: 04/17/2023 2:46:20 PM By: Duanne Guess MD FACS Entered By: Duanne Guess on 04/17/2023 14:42:26 -------------------------------------------------------------------------------- SuperBill Details Patient Name: Date of Service: [name] 04/17/2023 Medical Record Number: [id number] Patient Account Number: [id number] Date of Birth/Sex: Treating RN: 04/12/1946 (77 y.o. F) Primary Care Provider: Eustaquio Boyden Other Clinician: Referring Provider: Treating Provider/Extender: Priscille Loveless in Treatment: 37 Diagnosis Coding ICD-10 Codes Code Description [id number] Non-pressure chronic ulcer of other part of left lower leg with fat layer exposed L03.115 Cellulitis of right lower limb I83.893 Varicose veins of bilateral lower extremities with other complications I87.2 Venous insufficiency (chronic) (peripheral) G62.9 Polyneuropathy, unspecified R60.0 Localized edema Facility Procedures : CPT4 Code: [id number] Description: 97597 - DEBRIDE WOUND 1ST 20 SQ CM OR < ICD-10 Diagnosis Description L97.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 Modifier: d Quantity: 1 Physician Procedures : CPT4 Code Description Modifier [id number] - WC PHYS LEVEL 4 - EST PT 25 ICD-10 Diagnosis Description L97.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d I83.893 Varicose veins of bilateral lower extremities with  other complications G62.9 Polyneuropathy, unspecified I87.2 Venous insufficiency  (chronic) (peripheral) Quantity: 1 : [id number] 97597 - WC PHYS DEBR WO ANESTH 20 SQ CM ICD-10 Diagnosis Description L97.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d Quantity: 1 Electronic Signature(s) Signed: 04/17/2023 2:45:57 PM By: Duanne Guess MD FACS Entered By: Duanne Guess on 04/17/2023 14:45:57

## 2023-04-18 NOTE — Telephone Encounter (Signed)
Patient called in stating that the wound center stated that the wound on her leg isn't healing as quickly as they would like and suggest for her to have medicationpregabalin (LYRICA) 25 MG capsule  increased to 75 mg a day instead of 50 mg. They stated that Dr Reece Agar would be the one to make this increase.

## 2023-04-18 NOTE — Progress Notes (Signed)
JUMANAH, BOLING ([id number]) [id number] Page 1 of 7 Visit Report for 04/17/2023 Arrival Information Details Patient Name: Date of Service: [name], [name] 04/17/2023 2:00 PM Medical Record Number: [id number] Patient Account Number: [id number] Date of Birth/Sex: Treating RN: [birth year] (77 y.o. F) Primary Care [name]: [name] Other Clinician: Referring [name]: Treating [name]/Extender: [name] in Treatment: 37 Visit Information History Since Last Visit All ordered tests and consults were completed: No Patient Arrived: Ambulatory Added or deleted any medications: No Arrival Time: 14:07 Any new allergies or adverse reactions: No Accompanied By: self Had a fall or experienced change in No Transfer Assistance: None activities of daily living that may affect Patient Identification Verified: Yes risk of falls: Secondary Verification Process Completed: Yes Signs or symptoms of abuse/neglect since last visito No Patient Requires Transmission-Based Precautions: No Hospitalized since last visit: No Patient Has Alerts: No Implantable device outside of the clinic excluding No cellular tissue based products placed in the center since last visit: Pain Present Now: No Electronic Signature(s) Signed: 04/17/2023 3:37:14 PM By: [name] Entered By: [name] on 04/17/2023 14:08:03 -------------------------------------------------------------------------------- Compression Therapy Details Patient Name: Date of Service: [name], [name] 04/17/2023 2:00 PM Medical Record Number: [id number] Patient Account Number: [id number] Date of Birth/Sex: Treating RN: July 27, 1946 (77 y.o. [name] Primary Care Addilee Neu: [name] Other Clinician: Referring Milana Salay: Treating Lasaundra Riche/Extender: [name] in Treatment: 37 Compression Therapy Performed for Wound Assessment: Wound #2  Left,Posterior Lower Leg Performed By: Clinician [name] Ard, RN Compression Type: Double Layer Post Procedure Diagnosis Same as Pre-procedure Notes Unna boot with calamine Electronic Signature(s) Signed: 04/17/2023 2:57:21 PM By: [name] Ard RN Entered By: [name] on 04/17/2023 14:57:21 -------------------------------------------------------------------------------- Encounter Discharge Information Details Patient Name: Date of Service: [name] Buffy. 04/17/2023 2:00 PM Medical Record Number: [id number] Patient Account Number: [id number] Date of Birth/Sex: Treating RN: [birth year] (77 y.o. [name] Primary Care Siddhi Dornbush: [name] Other Clinician: Referring [name]: Treating [name]/Extender: [name] in Treatment: [phone] Encounter Discharge Information Items Post Procedure Vitals Discharge Condition: Stable Temperature (F): 98.3 Ambulatory Status: Ambulatory Pulse (bpm): 72 ESTEPHANIE, TING ([id number]) [id number] Page 2 of 7 Discharge Destination: Home Respiratory Rate (breaths/min): 20 Transportation: Private Auto Blood Pressure (mmHg): 167/81 Accompanied By: self Schedule Follow-up Appointment: Yes Clinical Summary of Care: Electronic Signature(s) Signed: 04/17/2023 2:59:55 PM By: [name] Ard RN Entered By: [name] on 04/17/2023 14:59:17 -------------------------------------------------------------------------------- Lower Extremity Assessment Details Patient Name: Date of Service: [name] 04/17/2023 2:00 PM Medical Record Number: [id number] Patient Account Number: [id number] Date of Birth/Sex: Treating RN: 1946/02/03 (77 y.o. [name] Primary Care Lula Michaux: [name] Other Clinician: Referring Cleburne Savini: Treating Abbagayle Zaragoza/Extender: Katharina Caper Weeks in Treatment: 37 Edema Assessment Assessed: [name]: No] [Right: No] Edema: [Left: Ye]  [Right: s] Calf Left: Right: Point of Measurement: 31 cm From Medial Instep 44.3 cm Ankle Left: Right: Point of Measurement: 9 cm From Medial Instep 24 cm Vascular Assessment Pulses: Dorsalis Pedis Palpable: [Left:Yes] Electronic Signature(s) Signed: 04/17/2023 2:59:55 PM By: [name] Ard RN Entered By: [name] on 04/17/2023 14:22:53 -------------------------------------------------------------------------------- Multi Wound Chart Details Patient Name: Date of Service: [name] Buffy. 04/17/2023 2:00 PM Medical Record Number: [id number] Patient Account Number: [id number] Date of Birth/Sex: Treating RN: [birth year] (77 y.o. F) Primary Care Levora Werden: [name] Other Clinician: Referring Marzetta Lanza: Treating Capucine Tryon/Extender: [name] in Treatment: 37 Vital Signs Height(in): 63 Pulse(bpm): 72 Weight(lbs): 200 Blood Pressure(mmHg): 167/81 Body Mass  Index(BMI): 35.4 Temperature(F): 98.3 Respiratory Rate(breaths/min): 20 [2:Photos:] [N/A:N/A] Left, Posterior Lower Leg N/A N/A Wound Location: Blister N/A N/A Wounding Event: Venous Leg Ulcer N/A N/A Primary Etiology: Cataracts, Asthma, Hypertension, N/A N/A Comorbid History: Neuropathy, Received Chemotherapy, Received Radiation 07/06/2022 N/A N/A Date Acquired: 63 N/A N/A Weeks of Treatment: Open N/A N/A Wound Status: No N/A N/A Wound Recurrence: 4.8x2.5x0.2 N/A N/A Measurements L x W x D (cm) 9.425 N/A N/A A (cm) : rea 1.885 N/A N/A Volume (cm) : -37.20% N/A N/A % Reduction in A rea: -174.40% N/A N/A % Reduction in Volume: Full Thickness Without Exposed N/A N/A Classification: Support Structures Medium N/A N/A Exudate A mount: Serosanguineous N/A N/A Exudate Type: red, brown N/A N/A Exudate Color: Distinct, outline attached N/A N/A Wound Margin: Small (1-33%) N/A N/A Granulation A mount: Red, Hyper-granulation N/A N/A Granulation Quality: Large (67-100%)  N/A N/A Necrotic A mount: Eschar, Adherent Slough N/A N/A Necrotic Tissue: Fat Layer (Subcutaneous Tissue): Yes N/A N/A Exposed Structures: Fascia: No Tendon: No Muscle: No Joint: No Bone: No Small (1-33%) N/A N/A Epithelialization: Debridement - Selective/Open Wound N/A N/A Debridement: Pre-procedure Verification/Time Out 14:32 N/A N/A Taken: Lidocaine 4% Topical Solution N/A N/A Pain Control: Slough N/A N/A Tissue Debrided: Non-Viable Tissue N/A N/A Level: 9.42 N/A N/A Debridement A (sq cm): rea Curette N/A N/A Instrument: Minimum N/A N/A Bleeding: Pressure N/A N/A Hemostasis A chieved: Procedure was tolerated well N/A N/A Debridement Treatment Response: 4.8x2.5x0.2 N/A N/A Post Debridement Measurements L x W x D (cm) 1.885 N/A N/A Post Debridement Volume: (cm) Scarring: Yes N/A N/A Periwound Skin Texture: Maceration: Yes N/A N/A Periwound Skin Moisture: Dry/Scaly: No Rubor: Yes N/A N/A Periwound Skin Color: No Abnormality N/A N/A Temperature: Yes N/A N/A Tenderness on Palpation: Debridement N/A N/A Procedures Performed: Treatment Notes Electronic Signature(s) Signed: 04/17/2023 2:38:37 PM By: Duanne Guess MD FACS Entered By: Duanne Guess on 04/17/2023 14:38:37 -------------------------------------------------------------------------------- Multi-Disciplinary Care Plan Details Patient Name: Date of Service: [name] Buffy. 04/17/2023 2:00 PM Medical Record Number: [id number] Patient Account Number: [id number] Date of Birth/Sex: Treating RN: [birth year] (77 y.o. [name] Primary Care Kanan Sobek: [name] Other Clinician: Referring Aerika Groll: Treating Kathlynn Swofford/Extender: [name] in Treatment: 37 Multidisciplinary Care Plan reviewed with physician [address] YOSHI, AKITA ([id number]) [id number] Page 4 of 7 Venous Leg Ulcer Nursing Diagnoses: Knowledge deficit  related to disease process and management Potential for venous Insuffiency (use before diagnosis confirmed) Goals: Patient will maintain optimal edema control Date Initiated: 02/07/2023 Target Resolution Date: 07/05/2023 Goal Status: Active Interventions: Assess peripheral edema status every visit. Compression as ordered Treatment Activities: Therapeutic compression applied : 02/07/2023 Notes: Wound/Skin Impairment Nursing Diagnoses: Impaired tissue integrity Goals: Ulcer/skin breakdown will have a volume reduction of 30% by week 4 Date Initiated: 08/03/2022 Date Inactivated: 09/10/2022 Target Resolution Date: 08/31/2022 Unmet Reason: uncontrolled edema, Goal Status: Unmet acute infection Ulcer/skin breakdown will have a volume reduction of 50% by week 8 Date Initiated: 09/10/2022 Target Resolution Date: 07/05/2023 Goal Status: Active Interventions: Assess ulceration(s) every visit Treatment Activities: Skin care regimen initiated : 08/03/2022 Notes: Keystone ordered. 10/30 RX Levo started. 09/10/22 Electronic Signature(s) Signed: 04/17/2023 2:59:55 PM By: [name] Ard RN Entered By: [name] on 04/17/2023 14:23:49 -------------------------------------------------------------------------------- Pain Assessment Details Patient Name: Date of Service: [name], [name] 04/17/2023 2:00 PM Medical Record Number: [id number] Patient Account Number: [id number] Date of Birth/Sex: Treating RN: 07-[name]-1946 (77 y.o. F) Primary Care Manjot Hinks: [name] Other Clinician: Referring Christobal Morado: Treating Mattis Featherly/Extender: Katharina Caper  Weeks in Treatment: 37 Active Problems Location of Pain Severity and Description of Pain Patient Has Paino Yes Site Locations Rate the pain. NEDDA, FRIPP ([id number]) [id number] Page 5 of 7 Rate the pain. Current Pain Level: 4 Worst Pain Level: 10 Least Pain Level: 0 Tolerable Pain Level: 1 Pain  Management and Medication Current Pain Management: Electronic Signature(s) Signed: 04/17/2023 3:37:14 PM By: [name] Entered By: [name] on 04/17/2023 14:08:49 -------------------------------------------------------------------------------- Patient/Caregiver Education Details Patient Name: Date of Service: [name], [name] 6/12/2024andnbsp2:00 PM Medical Record Number: [id number] Patient Account Number: [id number] Date of Birth/Gender: Treating RN: 02/28/1946 (77 y.o. [name] Primary Care Physician: [name] Other Clinician: Referring Physician: Treating Physician/Extender: [name] in Treatment: 37 Education Assessment Education Provided To: Patient Education Topics Provided Wound Debridement: Methods: Explain/Verbal Responses: Reinforcements needed, State content correctly Wound/Skin Impairment: Methods: Explain/Verbal Responses: Reinforcements needed, State content correctly Electronic Signature(s) Signed: 04/17/2023 2:59:55 PM By: [name] Ard RN Entered By: [name] on 04/17/2023 14:24:15 -------------------------------------------------------------------------------- Wound Assessment Details Patient Name: Date of Service: [name], [name] 04/17/2023 2:00 PM Medical Record Number: [id number] Patient Account Number: [id number] Date of Birth/Sex: Treating RN: [birth year] (77 y.o. F) Primary Care Abrahim Sargent: [name] Other Clinician: Referring Ferrell Flam: Treating Lien Lyman/Extender: Katharina Caper Weeks in Treatment: 37 Wound Status Wound Number: 2 Primary Venous Leg Ulcer Etiology: Wound Location: Left, Posterior Lower Leg [name], [name] ([id number]) [id number] Page 6 of 7 Wound Open Wounding Event: Blister Status: Date Acquired: 07/06/2022 Comorbid Cataracts, Asthma, Hypertension, Neuropathy, Received Weeks Of Treatment: 37 History: Chemotherapy, Received  Radiation Clustered Wound: No Photos Wound Measurements Length: (cm) 4.8 Width: (cm) 2.5 Depth: (cm) 0.2 Area: (cm) 9.425 Volume: (cm) 1.885 % Reduction in Area: -37.2% % Reduction in Volume: -174.4% Epithelialization: Small (1-33%) Tunneling: No Undermining: No Wound Description Classification: Full Thickness Without Exposed Support Structures Wound Margin: Distinct, outline attached Exudate Amount: Medium Exudate Type: Serosanguineous Exudate Color: red, brown Foul Odor After Cleansing: No Slough/Fibrino Yes Wound Bed Granulation Amount: Small (1-33%) Exposed Structure Granulation Quality: Red, Hyper-granulation Fascia Exposed: No Necrotic Amount: Large (67-100%) Fat Layer (Subcutaneous Tissue) Exposed: Yes Necrotic Quality: Eschar, Adherent Slough Tendon Exposed: No Muscle Exposed: No Joint Exposed: No Bone Exposed: No Periwound Skin Texture Texture Color No Abnormalities Noted: Yes No Abnormalities Noted: No Rubor: Yes Moisture No Abnormalities Noted: No Temperature / Pain Dry / Scaly: No Temperature: No Abnormality Maceration: Yes Tenderness on Palpation: Yes Treatment Notes Wound #2 (Lower Leg) Wound Laterality: Left, Posterior Cleanser Soap and Water Discharge Instruction: May shower and wash wound with dial antibacterial soap and water prior to dressing change. Peri-Wound Care Sween Lotion (Moisturizing lotion) Discharge Instruction: Apply moisturizing lotion as directed Topical Gentamicin Discharge Instruction: As directed by physician Mupirocin Ointment Discharge Instruction: Apply Mupirocin (Bactroban) as instructed [facility] Discharge Instruction: On hold since 03/01/23 Primary Dressing [address] [name], [name] ([id number]) [id number] Page 7 of 7 Discharge Instruction: Apply nickel thick amount to wound bed as instructed Secondary Dressing T Non-adherent Dressing, 2x3 in elfa Discharge Instruction: Apply over  primary dressing as directed. Zetuvit Plus 4x8 in Discharge Instruction: Apply over primary dressing as directed. Secured With [employer] T 2x10 (in/yd) ape Discharge Instruction: Secure with tape as directed. Compression Wrap Unnaboot w/Calamine, 4x10 (in/yd) Discharge Instruction: Apply Unnaboot as directed. Compression Stockings Add-Ons Electronic Signature(s) Signed: 04/17/2023 2:59:55 PM By: [name] Ard RN Entered By: [name] on 04/17/2023 14:23:18 -------------------------------------------------------------------------------- Vitals Details Patient Name: Date of Service: [name],  [name] 04/17/2023 2:00 PM Medical Record Number: [id number] Patient Account Number: [id number] Date of Birth/Sex: Treating RN: 1946/05/31 (77 y.o. F) Primary Care Tambra Muller: [name] Other Clinician: Referring Herberth Deharo: Treating Cherye Gaertner/Extender: [name] in Treatment: 37 Vital Signs Time Taken: 02:08 Temperature (F): 98.3 Height (in): 63 Pulse (bpm): 72 Weight (lbs): 200 Respiratory Rate (breaths/min): 20 Body Mass Index (BMI): 35.4 Blood Pressure (mmHg): 167/81 Reference Range: 80 - 120 mg / dl Electronic Signature(s) Signed: 04/17/2023 3:37:14 PM By: [name] Entered By: [name] on 04/17/2023 14:08:23

## 2023-04-19 MED ORDER — PREGABALIN 25 MG PO CAPS: ORAL_CAPSULE | ORAL | 1 refills | Status: DC

## 2023-04-19 NOTE — Telephone Encounter (Signed)
Noted  

## 2023-04-19 NOTE — Telephone Encounter (Signed)
Ok to increase to 75mg  daily - update [place] with effect after 1 week, watching for side effects of dizziness, sedation.  Take 25mg  in am and 50mg  in pm.

## 2023-04-19 NOTE — Telephone Encounter (Signed)
Read msg below as stated

## 2023-04-19 NOTE — Telephone Encounter (Signed)
Lvm asking pt to call back.  Need to relay Dr. G's message.  

## 2023-04-24 ENCOUNTER — Encounter (HOSPITAL_BASED_OUTPATIENT_CLINIC_OR_DEPARTMENT_OTHER): Payer: Medicare Other | Admitting: General Surgery

## 2023-04-24 DIAGNOSIS — I83893 Varicose veins of bilateral lower extremities with other complications: Secondary | ICD-10-CM | POA: Diagnosis not present

## 2023-04-24 DIAGNOSIS — R600 Localized edema: Secondary | ICD-10-CM | POA: Diagnosis not present

## 2023-04-24 DIAGNOSIS — L97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d: Secondary | ICD-10-CM | POA: Diagnosis not present

## 2023-04-24 DIAGNOSIS — L03115 Cellulitis of right lower limb: Secondary | ICD-10-CM | POA: Diagnosis not present

## 2023-04-24 DIAGNOSIS — L97222 Non-pressure chronic ulcer of left calf with fat layer exposed: Secondary | ICD-10-CM | POA: Diagnosis not present

## 2023-04-24 DIAGNOSIS — G629 Polyneuropathy, unspecified: Secondary | ICD-10-CM | POA: Diagnosis not present

## 2023-04-24 DIAGNOSIS — G6289 Other specified polyneuropathies: Secondary | ICD-10-CM | POA: Diagnosis not present

## 2023-04-24 DIAGNOSIS — I872 Venous insufficiency (chronic) (peripheral): Secondary | ICD-10-CM | POA: Diagnosis not present

## 2023-04-24 NOTE — Progress Notes (Signed)
MISA, WEESE ([id number]) [id number] Page 1 of 11 Visit Report for 04/24/2023 Chief Complaint Document Details Patient Name: Date of Service: [name], [name] 04/24/2023 2:15 PM Medical Record Number: [id number] Patient Account Number: [id number] Date of Birth/Sex: Treating RN: 04/14/1946 (77 y.o. F) Primary Care Provider: Eustaquio Boyden Other Clinician: Referring Provider: Treating Provider/Extender: Priscille Loveless in Treatment: 38 Information Obtained from: Patient Chief Complaint Patient presents for treatment of an open ulcer due to venous insufficiency Electronic Signature(s) Signed: 04/24/2023 2:32:34 PM By: Duanne Guess MD FACS Entered By: Duanne Guess on 04/24/2023 14:32:34 -------------------------------------------------------------------------------- Debridement Details Patient Name: Date of Service: [name] 04/24/2023 2:15 PM Medical Record Number: [id number] Patient Account Number: [id number] Date of Birth/Sex: Treating RN: [birth year] (77 y.o. [name] Primary Care Provider: Eustaquio Boyden Other Clinician: Referring Provider: Treating Provider/Extender: Priscille Loveless in Treatment: 38 Debridement Performed for Assessment: Wound #2 Left,Posterior Lower Leg Performed By: Physician Duanne Guess, MD Debridement Type: Debridement Severity of Tissue Pre Debridement: Fat layer exposed Level of Consciousness (Pre-procedure): Awake and Alert Pre-procedure Verification/Time Out Yes - 14:27 Taken: Start Time: 14:27 Pain Control: Lidocaine 4% T opical Solution Percent of Wound Bed Debrided: 100% T Area Debrided (cm): otal 8.44 Tissue and other material debrided: Non-Viable, Slough, Slough Level: Non-Viable Tissue Debridement Description: Selective/Open Wound Instrument: Curette Bleeding: Minimum Hemostasis Achieved: Pressure Response to Treatment:  Procedure was tolerated well Level of Consciousness (Post- Awake and Alert procedure): Post Debridement Measurements of Total Wound Length: (cm) 4.3 Width: (cm) 2.5 Depth: (cm) 0.2 Volume: (cm) 0.047 Character of Wound/Ulcer Post Debridement: Improved Severity of Tissue Post Debridement: Fat layer exposed Post Procedure Diagnosis [name], [name] ([id number]) [id number] Page 2 of 11 Same as Pre-procedure Notes scribed for Dr. Lady Gary by Samuella Bruin, RN Electronic Signature(s) Signed: 04/24/2023 3:03:55 PM By: Duanne Guess MD FACS Signed: 04/24/2023 4:31:19 PM By: Gelene Mink By: Samuella Bruin on 04/24/2023 14:29:16 -------------------------------------------------------------------------------- HPI Details Patient Name: Date of Service: [name] 04/24/2023 2:15 PM Medical Record Number: [id number] Patient Account Number: [id number] Date of Birth/Sex: Treating RN: 01-Aug-1946 (77 y.o. F) Primary Care Provider: Eustaquio Boyden Other Clinician: Referring Provider: Treating Provider/Extender: Priscille Loveless in Treatment: 38 History of Present Illness HPI Description: ADMISSION This is a 77 year old woman with a history of chronic venous insufficiency status post saphenous vein ablations in 2010 and 2016. 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 She presents today with an open venous ulcer on her left lower leg. It has been present for a little over 2 weeks. She saw her primary care provider who apparently swabbed the wound and grew out Pseudomonas. She just completed a course of ciprofloxacin for this. ABI was 0.91. She reports that she has had previous issues with ulcers in this same location, stemming back to a punch biopsy taken by a dermatologist many years ago. She has had several skin substitutes applied to the area that have ultimately resulted in healing on prior  occasions. She has 2 small ulcers on her left medial lower leg. There is slough accumulation in both of them. The more anterior of the 2 is quite small and has some soft slough on the surface, underneath which there is good granulation tissue. The more medial wound also has slough accumulation, but the underlying surface is fairly fibrotic and gritty. This is consistent with her provided history of multiple ulcers in the same location. 08/03/2022: The anterior wound is smaller today  with just a little bit of slough accumulation. The more medial wound continues to be very sensitive and fibrotic with slough buildup. 08/13/2022: The anterior wound is closed. The more medial wound remains sensitive with a fairly fibrotic surface. There is more granulation tissue filling in, however, and there is less slough than on prior occasions. 08/20/2022: The anterior wound remains closed. The more medial wound has filled with granulation tissue but still has a fair amount of slough on the surface and remains fairly tender. Unfortunately, she has developed 2 small ulcers just proximal to this. The fat layer is exposed in each. She says that over the weekend, she felt a burning sensation in that location. 08/27/2022: The 2 small wounds proximal to the main wound have merged into a single site. The wounds look a little bit dry, but they are quite clean without significant slough accumulation. They remain quite tender. 09/03/2022: All of the wounds have now merged into 1 large wound. There is a strong odor coming from the wound and the surface is not particularly viable. It is extremely painful for her today. 09/10/2022: The wound is less black and purple this week but still does not look particularly viable. The surface is desiccated. The culture that I took last week returned with a polymicrobial population including Pseudomonas. I prescribed both Augmentin and levofloxacin. She has not yet picked up levofloxacin, as  her pharmacy only notified her yesterday that it was ready. We have ordered a Keystone topical compounded antibiotic, but it has not yet come. 09/17/2022: The wound surface is markedly improved today. There is still an area of grayish-looking muscle but the rest appears significantly more viable. There is still a layer of slough on the surface. She still has a couple days left of oral antibiotic therapy. She has her Jodie Echevaria compounded topical antibiotic with her today. 09/24/2022: She has completed her oral antibiotic therapy. The wound surface is much cleaner today and more viable without any necrotic tissue. It is a bit desiccated, however. 10/01/2022: The moisture balance of the wound has improved. There is a layer of yellow slough on the surface, but beneath this, the wound is more pink. 10/08/2022: The wound is smaller and cleaner today with a layer of slough present. She is having less pain. 12/18; this patient has a new wound on the right lateral ankle which apparently started after a cat scratched her. Secondarily she managed to drop a cake pan on the same area. This is very painful. The original wound was on the left posterior calf we have been using [employer] under an [employer]. The Unna boot fell down and apparently she was in for a nurse visit perhaps last week and that 1 fell down as well This patient has central venous mediated hypertension. For member correctly she has a stent placed in the left common iliac artery by Dr. Randie Heinz some years ago [facility] Thurner syndrome] she also has had venous ablations and I believe a history of a DVT The patient has compression pumps at home but she does not use them 10/30/2022: The patient says that her wounds burned all week with the [facility] classic in place. She has a fair amount of slough and nonviable [name], [name] ([id number]) [id number] Page 3 of 11 subcutaneous tissue present on the right ankle. The left  posterior calf wound is cleaner than I have seen it to date. Both remain fairly tender. 11/14/2022: Both wounds have substantial slough and eschar accumulation. They remain extremely  tender. 11/21/2022: The wounds are cleaner this week, but still have slough and eschar accumulation. She continues to describe burning pain in both wounds, but upon further questioning, she actually has burning in both of her feet that radiates up her legs and includes to the wounds. 11/28/2022: Both wounds have slough and eschar accumulation, as well as adherent silver alginate that was difficult to remove. She continues to have pain out of proportion to the extent of her wounds. Her PCP did initiate Lyrica which she started taking last night. The dose is quite low, only 25 mg, but apparently there is a plan for upward titration, assuming she tolerates the medication. 12/05/2022: The wounds are both a little bit smaller today, but have significant slough accumulation, as usual. They remain exquisitely tender. She is now taking Lyrica twice a day. 12/19/2022: Both of her wounds look significantly better this week. There is slough buildup on both, but they are smaller. She seems to be getting a good response from Lyrica as she is having much less pain. 12/26/2022: The wound on her right lateral ankle is nearly closed. The wound on her left posterior calf is smaller but still has a lot of slough accumulation. They remain tender. 01/02/2023: The right lateral ankle wound is down to just a couple of millimeters. It is much less tender than on prior occasions. There is minimal slough buildup. The wound on her posterior calf continues to contract, as well. It has thick slough buildup and remains fairly tender. 01/09/2023: The right lateral ankle wound is nearly closed. He remains tender. There is just a little eschar on the surface. The wound on her posterior calf has improved quite a bit. There is still slough on the surface, but the  more proximal area has epithelialized substantially and there is no longer any gray discoloration to her tissue. 3/13; patient presents for follow-up. Her right lateral ankle wound is almost healed. She has a wound to her left posterior calf with slough and granulation tissue. We have been using Santyl under Unna boots bilaterally to the lower extremities. She has no issues or complaints today. 01/30/2023: The right lateral ankle wound is down to just a pinhole under a layer of eschar. The left posterior calf wound is quite a bit improved since the last time I saw it. There is still a layer of slough on the surface but there has been more epithelialization. 02/07/2023: The right lateral ankle wound still has some eschar on the surface. The left posterior calf wound is about the same size, but with more areas of epithelialization. 02/20/2023: The right lateral ankle wound is healed. The left posterior calf wound is smaller and is essentially flush with the surrounding skin, but the surface is quite dry. Her leg wrap slipped and her calf is quite swollen above the level of the wound. 03/01/2023: The right lateral ankle wound reopened and she also suffered a fall that resulted in a small wound on her right anterior tibial surface. The left posterior calf wound has a much better moisture balance today. There is a layer of slough on the surface. The [employer] worked much better for her as far as compression this week. 03/06/2023: The right lateral ankle wound has a layer of eschar on the surface and is too painful for me to debride. The anterior tibial surface wound is more or less healed; it is a very superficial abrasion at this point. The left posterior calf wound has a layer of slough on  the surface. Her edema control is better. 03/13/2023: The right lateral ankle wound has a layer of eschar on the surface. Once this was debrided, it was found to be healed. The anterior tibial surface wound has a layer of  stuck silver alginate on the surface. This was removed to reveal a very superficial abrasion but not much change from last week. The left posterior calf wound is smaller and has its usual layer of accumulated slough. 03/25/2023: The anterior tibial surface wound has eschar and silver alginate stuck to it. Once this was removed, the wound was found to be healed. The left posterior calf wound is roughly the same size and has a little bit thicker layer of accumulated slough. 04/03/2023: She has had more drainage from the posterior calf wound over the last week and the layer of slough is thicker. She also says it is more tender. 04/10/2023: Continued increase in drainage from her wound. The wound remains tender with a layer of slough on the surface. It does not appear as viable this week. She also has erythema, swelling, and warmth in her entire left lower leg extending into the midfoot. 04/17/2023: The wound is about the same size this week. There is fibrotic adherent slough, but the drainage is not as profuse. She is currently taking levofloxacin. She is complaining of an increase in the burning sensation at her wound. 04/24/2023: The wound measured smaller this week and is much cleaner-looking. She completed her course of levofloxacin. Her PCP increased her dose of Lyrica and she reports that the burning has stopped. Electronic Signature(s) Signed: 04/24/2023 2:34:37 PM By: Duanne Guess MD FACS Entered By: Duanne Guess on 04/24/2023 14:34:37 -------------------------------------------------------------------------------- Physical Exam Details Patient Name: Date of Service: ALYLA, NIEDERHAUSER 04/24/2023 2:15 PM Medical Record Number: [id number] Patient Account Number: [id number] Date of Birth/Sex: Treating RN: 1946-04-26 (77 y.o. F) Primary Care Provider: Eustaquio Boyden Other Clinician: Referring Provider: Treating Provider/Extender: Priscille Loveless in Treatment:  [address], [place] ([id number]) [id number] Page 4 of 11 Constitutional Hypertensive, asymptomatic. . . . no acute distress. Notes 04/24/2023: The wound measured smaller this week and is much cleaner-looking. Electronic Signature(s) Signed: 04/24/2023 2:35:53 PM By: Duanne Guess MD FACS Entered By: Duanne Guess on 04/24/2023 14:35:53 -------------------------------------------------------------------------------- Physician Orders Details Patient Name: Date of Service: [name], [name] 04/24/2023 2:15 PM Medical Record Number: [id number] Patient Account Number: [id number] Date of Birth/Sex: Treating RN: 08-04-1946 (77 y.o. [name] Primary Care Provider: Eustaquio Boyden Other Clinician: Referring Provider: Treating Provider/Extender: Priscille Loveless in Treatment: 58 Verbal / Phone Orders: No Diagnosis Coding ICD-10 Coding Code Description [phone] Non-pressure chronic ulcer of other part of left lower leg with fat layer exposed I83.893 Varicose veins of bilateral lower extremities with other complications I87.2 Venous insufficiency (chronic) (peripheral) G62.9 Polyneuropathy, unspecified R60.0 Localized edema Follow-up Appointments ppointment in 1 week. - Dr. Lady Gary Room 3 Return A Anesthetic Wound #2 Left,Posterior Lower Leg (In clinic) Topical Lidocaine 4% applied to wound bed Bathing/ Shower/ Hygiene May shower with protection but do not get wound dressing(s) wet. Protect dressing(s) with water repellant cover (for example, large plastic bag) or a cast cover and may then take shower. Edema Control - Lymphedema / SCD / Other Left Lower Extremity Lymphedema Pumps. Use Lymphedema pumps on leg(s) 2-3 times a day for 45-60 minutes. If wearing any wraps or hose, do not remove them. Continue exercising as instructed. - Use x1 per day Avoid standing for long periods of time.  Exercise  regularly Off-Loading Wound #2 Left,Posterior Lower Leg Other: - Elevate legs at heart level or above heart level while sitting. Wound Treatment Wound #2 - Lower Leg Wound Laterality: Left, Posterior Cleanser: Soap and Water 1 x Per Week/30 Days Discharge Instructions: May shower and wash wound with dial antibacterial soap and water prior to dressing change. Peri-Wound Care: Sween Lotion (Moisturizing lotion) 1 x Per Week/30 Days Discharge Instructions: Apply moisturizing lotion as directed Topical: Gentamicin 1 x Per Week/30 Days Discharge Instructions: As directed by physician Topical: Mupirocin Ointment 1 x Per Week/30 Days Discharge Instructions: Apply Mupirocin (Bactroban) as instructed [name], [name] ([id number]) [id number] Page 5 of 11 Prim Dressing: Hydrofera Blue Classic Foam, 4x4 in 1 x Per Week/30 Days ary Discharge Instructions: Moisten with saline prior to applying to wound bed Secondary Dressing: Zetuvit Plus 4x8 in 1 x Per Week/30 Days Discharge Instructions: Apply over primary dressing as directed. Secured With: 4M Medipore Scientist, research (life sciences) Surgical T 2x10 (in/yd) 1 x Per Week/30 Days ape Discharge Instructions: Secure with tape as directed. Compression Wrap: Unnaboot w/Calamine, 4x10 (in/yd) 1 x Per Week/30 Days Discharge Instructions: Apply Unnaboot as directed. Patient Medications llergies: sulfa antibiotics, Voltaren, doxycycline, Neoprene, adhesive, latex A Notifications Medication Indication Start End 04/24/2023 lidocaine DOSE topical 4 % cream - cream topical Electronic Signature(s) Signed: 04/24/2023 3:03:55 PM By: Duanne Guess MD FACS Entered By: Duanne Guess on 04/24/2023 14:36:06 -------------------------------------------------------------------------------- Problem List Details Patient Name: Date of Service: [name] 04/24/2023 2:15 PM Medical Record Number: [id number] Patient Account Number: [id number] Date of  Birth/Sex: Treating RN: May 03, 1946 (77 y.o. F) Primary Care Provider: Eustaquio Boyden Other Clinician: Referring Provider: Treating Provider/Extender: Priscille Loveless in Treatment: 58 Active Problems ICD-10 Encounter Code Description Active Date MDM Diagnosis [phone] Non-pressure chronic ulcer of other part of left lower leg with fat layer exposed 07/27/2022 No Yes I83.893 Varicose veins of bilateral lower extremities with other complications 07/27/2022 No Yes I87.2 Venous insufficiency (chronic) (peripheral) 07/27/2022 No Yes G62.9 Polyneuropathy, unspecified 07/27/2022 No Yes R60.0 Localized edema 07/27/2022 No Yes Inactive Problems ICD-10 Code Description Active Date Inactive Date JAKALYN, RENNA ([id number]) [id number] Page 6 of 11 L97.818 Non-pressure chronic ulcer of other part of right lower leg with other specified severity 03/01/2023 10/22/2022 L03.115 Cellulitis of right lower limb 10/22/2022 10/22/2022 Resolved Problems Electronic Signature(s) Signed: 04/24/2023 2:32:05 PM By: Duanne Guess MD FACS Entered By: Duanne Guess on 04/24/2023 14:32:05 -------------------------------------------------------------------------------- Progress Note Details Patient Name: Date of Service: [name] 04/24/2023 2:15 PM Medical Record Number: [id number] Patient Account Number: [id number] Date of Birth/Sex: Treating RN: September 02, 1946 (77 y.o. F) Primary Care Provider: Eustaquio Boyden Other Clinician: Referring Provider: Treating Provider/Extender: Priscille Loveless in Treatment: 38 Subjective Chief Complaint Information obtained from Patient Patient presents for treatment of an open ulcer due to venous insufficiency History of Present Illness (HPI) ADMISSION This is a 77 year old woman with a history of chronic venous insufficiency status post saphenous vein ablations in 2010 and 2016. She also has a  history of May-Thurner syndrome status post stenting. She presents today with an open venous ulcer on her left lower leg. It has been present for a little over 2 weeks. She saw her primary care provider who apparently swabbed the wound and grew out Pseudomonas. She just completed a course of ciprofloxacin for this. ABI was 0.91. She reports that she has had previous issues with ulcers in this same location, stemming back to a punch biopsy taken by a  dermatologist many years ago. She has had several skin substitutes applied to the area that have ultimately resulted in healing on prior occasions. She has 2 small ulcers on her left medial lower leg. There is slough accumulation in both of them. The more anterior of the 2 is quite small and has some soft slough on the surface, underneath which there is good granulation tissue. The more medial wound also has slough accumulation, but the underlying surface is fairly fibrotic and gritty. This is consistent with her provided history of multiple ulcers in the same location. 08/03/2022: The anterior wound is smaller today with just a little bit of slough accumulation. The more medial wound continues to be very sensitive and fibrotic with slough buildup. 08/13/2022: The anterior wound is closed. The more medial wound remains sensitive with a fairly fibrotic surface. There is more granulation tissue filling in, however, and there is less slough than on prior occasions. 08/20/2022: The anterior wound remains closed. The more medial wound has filled with granulation tissue but still has a fair amount of slough on the surface and remains fairly tender. Unfortunately, she has developed 2 small ulcers just proximal to this. The fat layer is exposed in each. She says that over the weekend, she felt a burning sensation in that location. 08/27/2022: The 2 small wounds proximal to the main wound have merged into a single site. The wounds look a little bit dry, but they are  quite clean without significant slough accumulation. They remain quite tender. 09/03/2022: All of the wounds have now merged into 1 large wound. There is a strong odor coming from the wound and the surface is not particularly viable. It is extremely painful for her today. 09/10/2022: The wound is less black and purple this week but still does not look particularly viable. The surface is desiccated. The culture that I took last week returned with a polymicrobial population including Pseudomonas. I prescribed both Augmentin and levofloxacin. She has not yet picked up levofloxacin, as her pharmacy only notified her yesterday that it was ready. We have ordered a Keystone topical compounded antibiotic, but it has not yet come. 09/17/2022: The wound surface is markedly improved today. There is still an area of grayish-looking muscle but the rest appears significantly more viable. There is still a layer of slough on the surface. She still has a couple days left of oral antibiotic therapy. She has her Jodie Echevaria compounded topical antibiotic with her today. 09/24/2022: She has completed her oral antibiotic therapy. The wound surface is much cleaner today and more viable without any necrotic tissue. It is a bit desiccated, however. 10/01/2022: The moisture balance of the wound has improved. There is a layer of yellow slough on the surface, but beneath this, the wound is more pink. 10/08/2022: The wound is smaller and cleaner today with a layer of slough present. She is having less pain. 12/18; this patient has a new wound on the right lateral ankle which apparently started after a cat scratched her. Secondarily she managed to drop a cake pan on the same area. This is very painful. [name], [name] ([id number]) [id number] Page 7 of 11 The original wound was on the left posterior calf we have been using [employer] under an [employer]. The Unna boot fell down and apparently she was  in for a nurse visit perhaps last week and that 1 fell down as well This patient has central venous mediated hypertension. For member correctly she has a stent placed in  the left common iliac artery by Dr. Randie Heinz some years ago [facility] Thurner syndrome] she also has had venous ablations and I believe a history of a DVT The patient has compression pumps at home but she does not use them 10/30/2022: The patient says that her wounds burned all week with the [facility] classic in place. She has a fair amount of slough and nonviable subcutaneous tissue present on the right ankle. The left posterior calf wound is cleaner than I have seen it to date. Both remain fairly tender. 11/14/2022: Both wounds have substantial slough and eschar accumulation. They remain extremely tender. 11/21/2022: The wounds are cleaner this week, but still have slough and eschar accumulation. She continues to describe burning pain in both wounds, but upon further questioning, she actually has burning in both of her feet that radiates up her legs and includes to the wounds. 11/28/2022: Both wounds have slough and eschar accumulation, as well as adherent silver alginate that was difficult to remove. She continues to have pain out of proportion to the extent of her wounds. Her PCP did initiate Lyrica which she started taking last night. The dose is quite low, only 25 mg, but apparently there is a plan for upward titration, assuming she tolerates the medication. 12/05/2022: The wounds are both a little bit smaller today, but have significant slough accumulation, as usual. They remain exquisitely tender. She is now taking Lyrica twice a day. 12/19/2022: Both of her wounds look significantly better this week. There is slough buildup on both, but they are smaller. She seems to be getting a good response from Lyrica as she is having much less pain. 12/26/2022: The wound on her right lateral ankle is nearly closed. The wound on her left  posterior calf is smaller but still has a lot of slough accumulation. They remain tender. 01/02/2023: The right lateral ankle wound is down to just a couple of millimeters. It is much less tender than on prior occasions. There is minimal slough buildup. The wound on her posterior calf continues to contract, as well. It has thick slough buildup and remains fairly tender. 01/09/2023: The right lateral ankle wound is nearly closed. He remains tender. There is just a little eschar on the surface. The wound on her posterior calf has improved quite a bit. There is still slough on the surface, but the more proximal area has epithelialized substantially and there is no longer any gray discoloration to her tissue. 3/13; patient presents for follow-up. Her right lateral ankle wound is almost healed. She has a wound to her left posterior calf with slough and granulation tissue. We have been using Santyl under Unna boots bilaterally to the lower extremities. She has no issues or complaints today. 01/30/2023: The right lateral ankle wound is down to just a pinhole under a layer of eschar. The left posterior calf wound is quite a bit improved since the last time I saw it. There is still a layer of slough on the surface but there has been more epithelialization. 02/07/2023: The right lateral ankle wound still has some eschar on the surface. The left posterior calf wound is about the same size, but with more areas of epithelialization. 02/20/2023: The right lateral ankle wound is healed. The left posterior calf wound is smaller and is essentially flush with the surrounding skin, but the surface is quite dry. Her leg wrap slipped and her calf is quite swollen above the level of the wound. 03/01/2023: The right lateral ankle wound reopened and  she also suffered a fall that resulted in a small wound on her right anterior tibial surface. The left posterior calf wound has a much better moisture balance today. There is a layer of  slough on the surface. The [employer] worked much better for her as far as compression this week. 03/06/2023: The right lateral ankle wound has a layer of eschar on the surface and is too painful for me to debride. The anterior tibial surface wound is more or less healed; it is a very superficial abrasion at this point. The left posterior calf wound has a layer of slough on the surface. Her edema control is better. 03/13/2023: The right lateral ankle wound has a layer of eschar on the surface. Once this was debrided, it was found to be healed. The anterior tibial surface wound has a layer of stuck silver alginate on the surface. This was removed to reveal a very superficial abrasion but not much change from last week. The left posterior calf wound is smaller and has its usual layer of accumulated slough. 03/25/2023: The anterior tibial surface wound has eschar and silver alginate stuck to it. Once this was removed, the wound was found to be healed. The left posterior calf wound is roughly the same size and has a little bit thicker layer of accumulated slough. 04/03/2023: She has had more drainage from the posterior calf wound over the last week and the layer of slough is thicker. She also says it is more tender. 04/10/2023: Continued increase in drainage from her wound. The wound remains tender with a layer of slough on the surface. It does not appear as viable this week. She also has erythema, swelling, and warmth in her entire left lower leg extending into the midfoot. 04/17/2023: The wound is about the same size this week. There is fibrotic adherent slough, but the drainage is not as profuse. She is currently taking levofloxacin. She is complaining of an increase in the burning sensation at her wound. 04/24/2023: The wound measured smaller this week and is much cleaner-looking. She completed her course of levofloxacin. Her PCP increased her dose of Lyrica and she reports that the burning has stopped. Patient  History Information obtained from Patient. Family History Cancer - Mother,Paternal Grandparents, Diabetes - Siblings, Heart Disease - Father, Hypertension - Father. Social History Former smoker - quit in 1967. Medical History Eyes Patient has history of Cataracts - L eye Ear/Nose/Mouth/Throat Denies history of Chronic sinus problems/congestion, Middle ear problems Hematologic/Lymphatic Denies history of Anemia, Human Immunodeficiency Virus, Lymphedema Respiratory Patient has history of Asthma [name], [name] ([id number]) [id number] Page 8 of 11 Cardiovascular Patient has history of Hypertension Endocrine Denies history of Type I Diabetes, Type II Diabetes Neurologic Patient has history of Neuropathy - Feet and finger Denies history of Seizure Disorder Oncologic Patient has history of Received Chemotherapy - 2012, Received Radiation - 2012 Hospitalization/Surgery History - Lower extremity venography 2020. - Intravascular ultrasound. - peripheral vascular intervention. - melanoma 2011. 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 no acute distress. Vitals Time Taken: 2:12 PM, Height: 63 in, Weight: 200 lbs, BMI: 35.4, Temperature: 98.1 F, Pulse: 71 bpm, Respiratory Rate: 18 breaths/min, Blood Pressure: 167/98 mmHg. General Notes: 04/24/2023: The wound measured smaller this week and is much cleaner-looking. Integumentary (Hair, Skin) Wound #2 status is Open. Original cause of wound  was Blister. The date acquired was: 07/06/2022. The wound has been in treatment 38 weeks. The wound is located on the Left,Posterior Lower Leg. The wound measures 4.3cm length x 2.5cm width x 0.2cm depth; 8.443cm^2  area and 1.689cm^3 volume. There is Fat Layer (Subcutaneous Tissue) exposed. There is no tunneling or undermining noted. There is a medium amount of serosanguineous drainage noted. The wound margin is distinct with the outline attached to the wound base. There is medium (34-66%) red granulation within the wound bed. There is a medium (34-66%) amount of necrotic tissue within the wound bed including Eschar and Adherent Slough. The periwound skin appearance had no abnormalities noted for texture. The periwound skin appearance exhibited: Maceration, Rubor. The periwound skin appearance did not exhibit: Dry/Scaly. Periwound temperature was noted as No Abnormality. The periwound has tenderness on palpation. Assessment Active Problems ICD-10 Non-pressure chronic ulcer of other part of left lower leg with fat layer exposed Varicose veins of bilateral lower extremities with other complications Venous insufficiency (chronic) (peripheral) Polyneuropathy, unspecified Localized edema Procedures Wound #2 Pre-procedure diagnosis of Wound #2 is a Venous Leg Ulcer located on the Left,Posterior Lower Leg .Severity of Tissue Pre Debridement is: Fat layer exposed. There was a Selective/Open Wound Non-Viable Tissue Debridement with a total area of 8.44 sq cm performed by Duanne Guess, MD. With the following instrument(s): Curette to remove Non-Viable tissue/material. Material removed includes [facility] after achieving pain control using Lidocaine 4% Topical Solution. No specimens were taken. A time out was conducted at 14:27, prior to the start of the procedure. A Minimum amount of bleeding was controlled with Pressure. The procedure was tolerated well. Post Debridement Measurements: 4.3cm length x 2.5cm width x 0.2cm depth; 0.047cm^3 volume. Character of Wound/Ulcer Post Debridement is improved. Severity of Tissue Post Debridement is: Fat layer exposed. Post procedure Diagnosis Wound #2: Same as  Pre-Procedure General Notes: scribed for Dr. Lady Gary by Samuella Bruin, RN. Pre-procedure diagnosis of Wound #2 is a Venous Leg Ulcer located on the Left,Posterior Lower Leg . There was a Double Layer Compression Therapy Procedure by Samuella Bruin, RN. [name], [name] ([id number]) [id number] Page 9 of 11 Post procedure Diagnosis Wound #2: Same as Pre-Procedure Plan Follow-up Appointments: Return Appointment in 1 week. - Dr. Lady Gary Room 3 Anesthetic: Wound #2 Left,Posterior Lower Leg: (In clinic) Topical Lidocaine 4% applied to wound bed Bathing/ Shower/ Hygiene: May shower with protection but do not get wound dressing(s) wet. Protect dressing(s) with water repellant cover (for example, large plastic bag) or a cast cover and may then take shower. Edema Control - Lymphedema / SCD / Other: Lymphedema Pumps. Use Lymphedema pumps on leg(s) 2-3 times a day for 45-60 minutes. If wearing any wraps or hose, do not remove them. Continue exercising as instructed. - Use x1 per day Avoid standing for long periods of time. Exercise regularly Off-Loading: Wound #2 Left,Posterior Lower Leg: Other: - Elevate legs at heart level or above heart level while sitting. The following medication(s) was prescribed: lidocaine topical 4 % cream cream topical was prescribed at facility WOUND #2: - Lower Leg Wound Laterality: Left, Posterior Cleanser: Soap and Water 1 x Per Week/30 Days Discharge Instructions: May shower and wash wound with dial antibacterial soap and water prior to dressing change. Peri-Wound Care: Sween Lotion (Moisturizing lotion) 1 x Per Week/30 Days Discharge Instructions: Apply moisturizing lotion as directed Topical: Gentamicin 1 x Per Week/30 Days Discharge Instructions: As directed by physician Topical: Mupirocin Ointment 1 x Per Week/30 Days Discharge Instructions: Apply Mupirocin (  Bactroban) as instructed Prim Dressing: Hydrofera Blue Classic Foam,  4x4 in 1 x Per Week/30 Days ary Discharge Instructions: Moisten with saline prior to applying to wound bed Secondary Dressing: Zetuvit Plus 4x8 in 1 x Per Week/30 Days Discharge Instructions: Apply over primary dressing as directed. Secured With: 95M Medipore Scientist, research (life sciences) Surgical T 2x10 (in/yd) 1 x Per Week/30 Days ape Discharge Instructions: Secure with tape as directed. Com pression Wrap: Unnaboot w/Calamine, 4x10 (in/yd) 1 x Per Week/30 Days Discharge Instructions: Apply Unnaboot as directed. 04/24/2023: The wound measured smaller this week and is much cleaner-looking. I used a curette to debride slough from the wound. We will continue topical gentamicin and mupirocin with Hydrofera Blue classic and calamine based Unna boot. Follow-up in 1 week. Electronic Signature(s) Signed: 04/24/2023 2:38:26 PM By: Duanne Guess MD FACS Entered By: Duanne Guess on 04/24/2023 14:38:25 -------------------------------------------------------------------------------- HxROS Details Patient Name: Date of Service: [name], [name] 04/24/2023 2:15 PM Medical Record Number: [id number] Patient Account Number: [id number] Date of Birth/Sex: Treating RN: 1946-03-11 (77 y.o. F) Primary Care Provider: Eustaquio Boyden Other Clinician: Referring Provider: Treating Provider/Extender: Priscille Loveless in Treatment: 38 Information Obtained From Patient Eyes Medical History: Positive for: Cataracts - L eye [name], [name] ([id number]) [id number] Page 10 of 11 Ear/Nose/Mouth/Throat Medical History: Negative for: Chronic sinus problems/congestion; Middle ear problems Hematologic/Lymphatic Medical History: Negative for: Anemia; Human Immunodeficiency Virus; Lymphedema Respiratory Medical History: Positive for: Asthma Past Medical History Notes: OSA on CPAP Cardiovascular Medical History: Positive for: Hypertension Past Medical History Notes: Cronic  venous insufficiency 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 Gastrointestinal Medical History: Past Medical History Notes: liver cyst Endocrine Medical History: Negative for: Type I Diabetes; Type II Diabetes Musculoskeletal Medical History: Past Medical History Notes: arthritis Neurologic Medical History: Positive for: Neuropathy - Feet and finger Negative for: Seizure Disorder Past Medical History Notes: restless leg syndrome Oncologic Medical History: Positive for: Received Chemotherapy - 2012; Received Radiation - 2012 HBO Extended History Items Eyes: Cataracts Immunizations Pneumococcal Vaccine: Received Pneumococcal Vaccination: Yes Received Pneumococcal Vaccination On or After 60th Birthday: Yes Implantable Devices No devices added Hospitalization / Surgery History Type of Hospitalization/Surgery Lower extremity venography 2020 Intravascular ultrasound peripheral vascular intervention melanoma 2011 Family and Social History Cancer: Yes - Mother,Paternal Grandparents; Diabetes: Yes - Siblings; Heart Disease: Yes - Father; Hypertension: Yes - Father; Former smoker - quit in [place], [place] ([id number]) [id number] Page 11 of 11 1967 Electronic Signature(s) Signed: 04/24/2023 3:03:55 PM By: Duanne Guess MD FACS Entered By: Duanne Guess on 04/24/2023 14:34:44 -------------------------------------------------------------------------------- SuperBill Details Patient Name: Date of Service: [name] 04/24/2023 Medical Record Number: [id number] Patient Account Number: [id number] Date of Birth/Sex: Treating RN: 01/31/1946 (77 y.o. F) Primary Care Provider: Eustaquio Boyden Other Clinician: Referring Provider: Treating Provider/Extender: Priscille Loveless in Treatment: 38 Diagnosis Coding ICD-10 Codes Code Description [phone] Non-pressure chronic ulcer of other part of left lower  leg with fat layer exposed I83.893 Varicose veins of bilateral lower extremities with other complications I87.2 Venous insufficiency (chronic) (peripheral) G62.9 Polyneuropathy, unspecified R60.0 Localized edema Facility Procedures : CPT4 Code: [id number] Description: 97597 - DEBRIDE WOUND 1ST 20 SQ CM OR < ICD-10 Diagnosis Description L97.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 Modifier: d Quantity: 1 Physician Procedures : CPT4 Code Description Modifier [id number] - WC PHYS LEVEL 4 - EST PT 25 ICD-10 Diagnosis Description L97.822 Non-pressure chronic ulcer of other part of left lower leg  with fat layer exposed I83.893 Varicose veins of bilateral lower extremities with  other complications I87.2 Venous insufficiency (chronic) (peripheral) G62.9 Polyneuropathy, unspecified Quantity: 1 : [id number] 97597 - WC PHYS DEBR WO ANESTH 20 SQ CM ICD-10 Diagnosis Description L97.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d Quantity: 1 Electronic Signature(s) Signed: 04/24/2023 2:39:08 PM By: Duanne Guess MD FACS Entered By: Duanne Guess on 04/24/2023 14:39:08

## 2023-04-29 ENCOUNTER — Other Ambulatory Visit: Payer: Medicare Other

## 2023-04-29 ENCOUNTER — Encounter: Payer: Self-pay | Admitting: Physician Assistant

## 2023-04-29 ENCOUNTER — Ambulatory Visit (INDEPENDENT_AMBULATORY_CARE_PROVIDER_SITE_OTHER): Payer: Medicare Other | Admitting: Physician Assistant

## 2023-04-29 VITALS — BP 136/80 | HR 86 | Ht 61.0 in | Wt 214.0 lb

## 2023-04-29 DIAGNOSIS — K529 Noninfective gastroenteritis and colitis, unspecified: Secondary | ICD-10-CM | POA: Diagnosis not present

## 2023-04-29 NOTE — Progress Notes (Signed)
Chief Complaint: Chronic diarrhea  HPI:    [name] is a 77 year old female, known to Dr. Myrtie Neither, with past medical history as listed below including complication of anesthesia and DVT on aspirin, who was referred to me by Eustaquio Boyden, MD for a complaint of chronic diarrhea.      01/06/2018 colonoscopy with decreased sphincter tone, diverticulosis in the left and right colon otherwise normal.  No repeat recommended due to age.    04/23/2022 abdominal x-ray normal.    10/24/2022 CBC, CMP normal.  Iron studies and iron percent saturation slightly decreased today 18.9, normal ferritin.    Today, patient presents to clinic and tells me that over the past year she has had issues with diarrhea.  Describes all of her medical history including trouble with chronic venous ulcers for which she is constantly getting antibiotics.  Apparently this diarrhea started after she was hospitalized on on IV antibiotics for one of these such ulcers.  When she got home she started with episodes of fecal incontinence and loose stool.  She was told by her pharmacist to buy Florastor probiotic and when she started taking that her stool did start to form up a bit.  She tells me now that it is mostly just "supersoft", with 1-2 urgent episodes a week of just straight liquid that is uncontrollable with some incontinence.      Does tell me that she had "colitis" in her youth (which sounds like IBS given that she was then sent to a therapist to help with this).  She tells me that at that time after she divorced her husband all of her symptoms went away but tells me now recently she is under a lot of stress having to live with her sister whom she does not like that much and has a lot of health concerns and feels like this could have cropped up again in her life.    She does still have her gallbladder.    Denies fever, chills, weight loss, blood in her stool, nausea, vomiting or abdominal pain.  Past Medical History:  Diagnosis  Date   Allergy    Anesthesia complication    trouble waking up 2/2 CPAP   Arthritis    Asthma in adult    Cataract 2019   left eye resolved with surgery   Cervical spondylosis 2012   [place] [facility])   Choroidal nevus of left eye 03/22/2016   Chronic venous insufficiency 2016   severe reflux with painful varicosities sees [facility]   Clotting disorder (HCC)    due to vein ablation    Cognitive dysfunction    Complication of anesthesia    difficulty coming out due to sleep apnea per pt   Difficult intubation    PATIENT DENIES    Diverticulosis    per ct scans 09-2016 and 01-2017   DVT (deep venous thrombosis) (HCC) 2011   small, developed after venous ablation   DVT (deep venous thrombosis) (HCC)    Family history of adverse reaction to anesthesia    O2 levels drop upon waking    Hearing loss sensory, bilateral 2013   mod-severe high freq sensorineural ([facility])   Hiatal hernia     09-2016 ct scan [facility] at cancer center    History of kidney stones 1980s   History of radiation therapy 07/19/11-08/25/2011   RIGHT AXILLARY REGION/METASTATIC   Hypertension    Melanoma (HCC) 06/29/2010   MALIGNANT MELANOMA R SHOULDER/SUPRASCAPULAR BACK s/p interferon chemo and XRT  Neuromuscular disorder (HCC)    muscle spasms   OSA (obstructive sleep apnea)    on CPAP   Pneumonia    2001   RLS (restless legs syndrome)    Seasonal allergies    Sleep apnea    wears cpap   Tinnitus    Unspecified vitamin D deficiency 03/18/2013   Varicose veins of left lower extremity     Past Surgical History:  Procedure Laterality Date   ABI  2016   WNL   ANTERIOR CERVICAL DECOMP/DISCECTOMY FUSION N/A 08/22/2015   ANTERIOR CERVICAL DECOMPRESSION FUSION C3/4 - interbody graft and anterior plate; Barnett Abu, MD   ANTERIOR CERVICAL DECOMP/DISCECTOMY FUSION N/A 07/02/2016   Procedure: Cervical four- five Cervical five- six Cervical six- seven Anterior cervical decompression/diskectomy/fusion;  Surgeon:  Barnett Abu, MD;  Location: [facility];  Service: Neurosurgery;  Laterality: N/A;  C4-5 C5-6 C6-7 Anterior cervical decompression/diskectomy/fusion   BREAST BIOPSY Left 2013   BENIGN   BREAST SURGERY Right 1990   BX    CARDIOVASCULAR STRESS TEST  2010   normal stress test, EF 66%   CATARACT EXTRACTION W/ INTRAOCULAR LENS IMPLANT Left 2019   Dr. Dagoberto Ligas   COLONOSCOPY  03/2005   benign polyp, int hem, rpt 5 yrs ([place], [facility])   COLONOSCOPY WITH PROPOFOL N/A 01/06/2018   diverticulosis, decreased sphincter tone, no f/u needed Sherrilyn Rist, MD)   CORONARY ULTRASOUND/IVUS N/A 08/03/2019   Maeola Harman, MD   DEEP AXILLARY SENTINEL NODE BIOPSY / EXCISION  2012   RIGHT   DEXA  06/2016   WNL   DILATION AND CURETTAGE OF UTERUS  2010   ENDOVENOUS ABLATION SAPHENOUS VEIN W/ LASER Left 2010   [facility]   ENDOVENOUS ABLATION SAPHENOUS VEIN W/ LASER Left 2016   accessory branch [facility])   ESOPHAGOGASTRODUODENOSCOPY  2006   mod chronic carditis, no H pylori ([place], [facility])   KNEE SURGERY  1985   left   LOWER EXTREMITY VENOGRAPHY Left 08/03/2019   Maeola Harman, MD   LUMBAR LAMINECTOMY  2001   L4/5   MELANOMA EXCISION  2011   Right shoulder, with sentinel lymph node biopsy   PERIPHERAL VASCULAR INTERVENTION Left 08/03/2019   Stent of left common iliac vein with 14 x 60 mm Vici Randie Heinz, Dennard Schaumann, MD)   [facility] REMOVAL  12/05/2011   Procedure: REMOVAL PORT-A-CATH;  Surgeon: Emelia Loron, MD;  Location: [facility];  Service: General;  Laterality: N/A;  left port removal   PORTACATH PLACEMENT     left subclavian   UPPER GASTROINTESTINAL ENDOSCOPY      Current Outpatient Medications  Medication Sig Dispense Refill   acetaminophen (TYLENOL) 650 MG CR tablet Take 2 tablets (1,300 mg total) by mouth 2 (two) times daily as needed for pain.     albuterol (VENTOLIN HFA) 108 (90 Base) MCG/ACT inhaler INHALE 1-2 PUFFS BY MOUTH  EVERY 6 HOURS AS NEEDED FOR WHEEZE OR SHORTNESS OF BREATH 8.5 each 0   aspirin EC 81 MG tablet Take 81 mg by mouth at bedtime. Melanoma prevention     furosemide (LASIX) 40 MG tablet Take 1 tablet (40 mg total) by mouth daily. Take second dose if needed for leg swelling/weight gain 100 tablet 4   loratadine (CLARITIN) 10 MG tablet Take 10 mg by mouth daily.     losartan (COZAAR) 50 MG tablet Take 1 tablet (50 mg total) by mouth daily. 90 tablet 4   montelukast (SINGULAIR)  10 MG tablet Take 1 tablet (10 mg total) by mouth daily. 90 tablet 4   Multiple Vitamin (MULTIVITAMIN WITH MINERALS) TABS tablet Take 1 tablet by mouth daily.     phenol (CHLORASEPTIC GARGLE) 1.4 % LIQD Use as directed 1 spray in the mouth or throat as needed for throat irritation / pain. 118 mL 0   Polyvinyl Alcohol-Povidone (TEARS PLUS OP) Place 1 drop into both eyes daily as needed (dry eyes/ redness/ burning).      potassium chloride SA (KLOR-CON M20) 20 MEQ tablet Take 1 tablet (20 mEq total) by mouth 2 (two) times daily. 180 tablet 4   predniSONE (DELTASONE) 20 MG tablet Take 1 tablet (20 mg total) by mouth daily with breakfast. 5 tablet 0   pregabalin (LYRICA) 25 MG capsule Take 1 capsule (25 mg total) by mouth in the morning AND 2 capsules (50 mg total) at bedtime. 90 capsule 1   PRESCRIPTION MEDICATION CPAP     saccharomyces boulardii (FLORASTOR) 250 MG capsule Take 1 capsule (250 mg total) by mouth 2 (two) times daily.     triamcinolone (NASACORT) 55 MCG/ACT AERO nasal inhaler Place 2 sprays into the nose daily. In each nostril     Vitamin D, Ergocalciferol, (DRISDOL) 1.25 MG (50000 UNIT) CAPS capsule TAKE 1 CAPSULE BY MOUTH ONCE A WEEK 12 capsule 4   No current facility-administered medications for this visit.    Allergies as of 04/29/2023 - Review Complete 03/11/2023  Allergen Reaction Noted   Sulfa antibiotics Itching, Swelling, and Shortness Of Breath 08/16/2015   Voltaren [diclofenac sodium] Shortness Of Breath  08/16/2017   Latex Other (See Comments) 06/22/2016   Tape Other (See Comments) 08/16/2015   Doxycycline Other (See Comments) 02/01/2020   Gabapentin Other (See Comments) 11/26/2022   Other  11/22/2017   Wound dressing adhesive Rash 12/10/2017    Family History  Problem Relation Age of Onset   Cancer Mother 52       uterine   CAD Father 53       MI   Hypertension Father 22   Alzheimer's disease Father 27   Heart attack Father    Diabetes Sister    Cancer Paternal Grandmother        melanoma, possibly   Cancer Cousin        x2, breast   Colon cancer Neg Hx    Colon polyps Neg Hx    Rectal cancer Neg Hx    Stomach cancer Neg Hx     Social History   Socioeconomic History   Marital status: Divorced    Spouse name: Not on file   Number of children: 0   Years of education: Not on file   Highest education level: Some college, no degree  Occupational History   Occupation: retired    Associate Professor: RETIRED    Comment: Emergency planning/management officer   Tobacco Use   Smoking status: Former    Types: Cigarettes    Quit date: 11/05/1965    Years since quitting: 57.5    Passive exposure: Never   Smokeless tobacco: Never   Tobacco comments:    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   Vaping Use: Former  Substance and Sexual Activity   Alcohol use: No    Alcohol/week: 0.0 standard drinks of alcohol   Drug use: No   Sexual activity: Never  Other Topics Concern   Not on file  Social History Narrative   L handed   01/04/22 Lives alone in  RV.  Sister and husband live next door   Occupation: retired, prior worked as Emergency planning/management officer at [employer]   Edu: some college   Diet: poor - good water, rare fruits/vegetables   Social Determinants of Health   Financial Resource Strain: Low Risk  (10/19/2022)   Overall Financial Resource Strain (CARDIA)    Difficulty of Paying Living Expenses: Not very hard  Food Insecurity: No Food Insecurity (10/19/2022)   Hunger Vital Sign    Worried About Running Out of Food  in the Last Year: Never true    Ran Out of Food in the Last Year: Never true  Transportation Needs: No Transportation Needs (10/19/2022)   PRAPARE - Administrator, Civil Service (Medical): No    Lack of Transportation (Non-Medical): No  Physical Activity: Sufficiently Active (10/19/2022)   Exercise Vital Sign    Days of Exercise per Week: 7 days    Minutes of Exercise per Session: 30 min  Stress: No Stress Concern Present (10/19/2022)   [employer] of Occupational Health - Occupational Stress Questionnaire    Feeling of Stress : Not at all  Social Connections: Moderately Isolated (10/19/2022)   Social Connection and Isolation Panel [NHANES]    Frequency of Communication with Friends and Family: More than three times a week    Frequency of Social Gatherings with Friends and Family: More than three times a week    Attends Religious Services: Never    Database administrator or Organizations: Yes    Attends Engineer, structural: More than 4 times per year    Marital Status: Divorced  Intimate Partner Violence: Not At Risk (10/19/2022)   Humiliation, Afraid, Rape, and Kick questionnaire    Fear of Current or Ex-Partner: No    Emotionally Abused: No    Physically Abused: No    Sexually Abused: No    Review of Systems:    Constitutional: No weight loss, fever or chills Skin: No rash  Cardiovascular: No chest pain Respiratory: No SOB Gastrointestinal: See HPI and otherwise negative Genitourinary: No dysuria  Neurological: No headache, dizziness or syncope Musculoskeletal: No new muscle or joint pain Hematologic: No bleeding Psychiatric: No history of depression or anxiety   Physical Exam:  Vital signs: BP 136/80 (BP Location: Left Arm, Patient Position: Sitting, Cuff Size: Normal)   Pulse 86   Ht 5\' 1"  (1.549 m)   Wt 214 lb (97.1 kg)   SpO2 97%   BMI 40.43 kg/m    Constitutional:   Pleasant Elderly Caucasian female appears to be in NAD, Well  developed, Well nourished, alert and cooperative Head:  Normocephalic and atraumatic. Eyes:   PEERL, EOMI. No icterus. Conjunctiva pink. Ears:  Normal auditory acuity. Neck:  Supple Throat: Oral cavity and pharynx without inflammation, swelling or lesion.  Respiratory: Respirations even and unlabored. Lungs clear to auscultation bilaterally.   No wheezes, crackles, or rhonchi.  Cardiovascular: Normal S1, S2. No MRG. Regular rate and rhythm. No peripheral edema, cyanosis or pallor.  Gastrointestinal:  Soft, nondistended, nontender. No rebound or guarding.  Increased bowel sounds all 4 quadrants, no appreciable masses or hepatomegaly. Rectal:  Not performed.  Msk:  Symmetrical without gross deformities. Without edema, no deformity or joint abnormality.  Neurologic:  Alert and  oriented x4;  grossly normal neurologically.  Skin:   Dry and intact without significant lesions or rashes. Psychiatric: Demonstrates good judgement and reason without abnormal affect or behaviors.  RELEVANT LABS AND IMAGING: CBC  Component Value Date/Time   WBC 7.2 10/24/2022 1355   RBC 4.08 10/24/2022 1355   HGB 12.3 10/24/2022 1355   HGB 13.1 08/15/2022 1208   HGB 13.2 08/15/2017 1039   HGB 12.8 10/21/2012 0000   HCT 36.6 10/24/2022 1355   HCT 40.5 08/15/2017 1039   PLT 249.0 10/24/2022 1355   PLT 200 08/15/2022 1208   PLT 196 08/15/2017 1039   MCV 89.6 10/24/2022 1355   MCV 94 08/15/2017 1039   MCH 30.5 08/15/2022 1208   MCHC 33.7 10/24/2022 1355   RDW 14.0 10/24/2022 1355   RDW 13.5 08/15/2017 1039   LYMPHSABS 1.9 10/24/2022 1355   LYMPHSABS 2.0 08/15/2017 1039   MONOABS 0.5 10/24/2022 1355   EOSABS 0.2 10/24/2022 1355   EOSABS 0.2 08/15/2017 1039   BASOSABS 0.0 10/24/2022 1355   BASOSABS 0.0 08/15/2017 1039    CMP     Component Value Date/Time   NA 142 10/24/2022 1355   NA 149 (H) 08/15/2017 1039   NA 143 09/17/2016 1018   K 3.8 10/24/2022 1355   K 4.1 08/15/2017 1039   K 3.5  09/17/2016 1018   CL 104 10/24/2022 1355   CL 107 08/15/2017 1039   CO2 32 10/24/2022 1355   CO2 29 08/15/2017 1039   CO2 28 09/17/2016 1018   GLUCOSE 86 10/24/2022 1355   GLUCOSE 107 08/15/2017 1039   BUN 14 10/24/2022 1355   BUN 14 08/15/2017 1039   BUN 19.5 09/17/2016 1018   CREATININE 0.77 10/24/2022 1355   CREATININE 0.80 08/15/2022 1208   CREATININE 0.79 04/20/2022 1526   CREATININE 0.8 09/17/2016 1018   CALCIUM 9.4 10/24/2022 1355   CALCIUM 9.7 08/15/2017 1039   CALCIUM 9.8 09/17/2016 1018   PROT 6.8 10/24/2022 1355   PROT 7.9 08/15/2017 1039   PROT 7.2 09/17/2016 1018   ALBUMIN 3.9 10/24/2022 1355   ALBUMIN 4.0 08/15/2017 1039   ALBUMIN 3.6 09/17/2016 1018   AST 13 10/24/2022 1355   AST 12 (L) 08/15/2022 1208   AST 10 09/17/2016 1018   ALT 16 10/24/2022 1355   ALT 14 08/15/2022 1208   ALT 43 08/15/2017 1039   ALT 18 09/17/2016 1018   ALKPHOS 59 10/24/2022 1355   ALKPHOS 48 08/15/2017 1039   ALKPHOS 71 09/17/2016 1018   BILITOT 0.5 10/24/2022 1355   BILITOT 0.5 08/15/2022 1208   BILITOT 0.39 09/17/2016 1018   GFRNONAA >60 08/15/2022 1208   GFRAA >60 05/05/2020 1115    Assessment: 1.  Chronic diarrhea: Colonoscopy in 2019 with decreased sphincter tone and diverticulosis, diarrhea started about a year ago, patient reports many antibiotics and some benefit from Florastor over the past month or so, continues with some urgency and occasional incontinence and soft stool, history of what sounds like IBS in her youth and now under a lot of stress; consider infectious cause versus IBS versus other (likely some of the incontinence is due to decreased sphincter tone)  Plan: 1.  Ordered stool studies today to include a GI path panel, O&P, fecal calprotectin and fecal leukocytes as well as fecal pancreatic elastase. 2.  Pending above could consider Dicyclomine/hyoscyamine for suspected IBS 3.  Patient to follow in clinic with me in 2 to 3 months or sooner if  necessary.  Hyacinth Meeker, PA-C [facility] 04/29/2023, 1:59 PM  Cc: Eustaquio Boyden, MD

## 2023-04-29 NOTE — Patient Instructions (Signed)
Your provider has requested that you go to the basement level for lab work before leaving today. Press "B" on the elevator. The lab is located at the first door on the left as you exit the elevator.  _______________________________________________________  If your blood pressure at your visit was 140/90 or greater, please contact your primary care physician to follow up on this.  _______________________________________________________  If you are age 77 or older, your body mass index should be between 23-30. Your Body mass index is 40.43 kg/m. If this is out of the aforementioned range listed, please consider follow up with your Primary Care Provider.  If you are age 25 or younger, your body mass index should be between 19-25. Your Body mass index is 40.43 kg/m. If this is out of the aformentioned range listed, please consider follow up with your Primary Care Provider.   ________________________________________________________  The [facility] providers would like to encourage you to use [facility] to communicate with providers for non-urgent requests or questions.  Due to long hold times on the telephone, sending your provider a message by [facility] may be a faster and more efficient way to get a response.  Please allow 48 business hours for a response.  Please remember that this is for non-urgent requests.  _______________________________________________________

## 2023-05-01 ENCOUNTER — Encounter (HOSPITAL_BASED_OUTPATIENT_CLINIC_OR_DEPARTMENT_OTHER): Payer: Medicare Other | Admitting: General Surgery

## 2023-05-01 DIAGNOSIS — G629 Polyneuropathy, unspecified: Secondary | ICD-10-CM | POA: Diagnosis not present

## 2023-05-01 DIAGNOSIS — I872 Venous insufficiency (chronic) (peripheral): Secondary | ICD-10-CM | POA: Diagnosis not present

## 2023-05-01 DIAGNOSIS — R600 Localized edema: Secondary | ICD-10-CM | POA: Diagnosis not present

## 2023-05-01 DIAGNOSIS — L97222 Non-pressure chronic ulcer of left calf with fat layer exposed: Secondary | ICD-10-CM | POA: Diagnosis not present

## 2023-05-01 DIAGNOSIS — L03115 Cellulitis of right lower limb: Secondary | ICD-10-CM | POA: Diagnosis not present

## 2023-05-01 DIAGNOSIS — I83893 Varicose veins of bilateral lower extremities with other complications: Secondary | ICD-10-CM | POA: Diagnosis not present

## 2023-05-01 DIAGNOSIS — L97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d: Secondary | ICD-10-CM | POA: Diagnosis not present

## 2023-05-02 NOTE — Progress Notes (Signed)
ASMA, BOLDON ([id number]) [id number] Page 1 of 11 Visit Report for 05/01/2023 Chief Complaint Document Details Patient Name: Date of Service: Amber, [name] 05/01/2023 2:00 PM Medical Record Number: [id number] Patient Account Number: [id number] Date of Birth/Sex: Treating RN: Nov 17, 1945 (77 y.o. F) Primary Care Provider: Eustaquio [name] Other Clinician: Referring Provider: Treating Provider/Extender: [name] in Treatment: 39 Information Obtained from: Patient Chief Complaint Patient presents for treatment of an open ulcer due to venous insufficiency Electronic Signature(s) Signed: 05/01/2023 2:50:31 PM By: Amber Guess MD FACS Entered By: [name] on 05/01/2023 14:50:31 -------------------------------------------------------------------------------- Debridement Details Patient Name: Date of Service: [name] 05/01/2023 2:00 PM Medical Record Number: [id number] Patient Account Number: [id number] Date of Birth/Sex: Treating RN: Feb 26, 1946 (77 y.o. [name] Primary Care Provider: Eustaquio [name] Other Clinician: Referring Provider: Treating Provider/Extender: [name] in Treatment: 39 Debridement Performed for Assessment: Wound #2 Left,Posterior Lower Leg Performed By: Physician Amber Guess, MD Debridement Type: Debridement Severity of Tissue Pre Debridement: Fat layer exposed Level of Consciousness (Pre-procedure): Awake and Alert Pre-procedure Verification/Time Out Yes - 14:30 Taken: Start Time: 14:32 Pain Control: Lidocaine 4% T opical Solution Percent of Wound Bed Debrided: 100% T Area Debrided (cm): otal 7.22 Tissue and other material debrided: Non-Viable, Slough, Slough Level: Non-Viable Tissue Debridement Description: Selective/Open Wound Instrument: Curette Bleeding: Minimum Hemostasis Achieved: Pressure End Time: 14:36 Procedural Pain:  0 Post Procedural Pain: 0 Response to Treatment: Procedure was tolerated well Level of Consciousness (Post- Awake and Alert procedure): Post Debridement Measurements of Total Wound Length: (cm) 4 Width: (cm) 2.3 Depth: (cm) 0.2 Volume: (cm) 1.445 Character of Wound/Ulcer Post Debridement: Improved Amber, [name] ([id number]) [id number] Page 2 of 11 Severity of Tissue Post Debridement: Fat layer exposed Post Procedure Diagnosis Same as Pre-procedure Notes Scribed for Dr Lady Gary by Amber Grills RN. Electronic Signature(s) Signed: 05/01/2023 3:11:40 PM By: Amber Guess MD FACS Signed: 05/02/2023 12:38:14 PM By: [name] Entered By: [name] on 05/01/2023 14:35:22 -------------------------------------------------------------------------------- HPI Details Patient Name: Date of Service: Amber, [name] 05/01/2023 2:00 PM Medical Record Number: [id number] Patient Account Number: [id number] Date of Birth/Sex: Treating RN: 12/05/1945 (77 y.o. F) Primary Care Provider: Eustaquio [name] Other Clinician: Referring Provider: Treating Provider/Extender: [name] in Treatment: 39 History of Present Illness HPI Description: ADMISSION This is a 77 year old woman with a history of chronic venous insufficiency status post saphenous vein ablations in 2010 and 2016. 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 She presents today with an open venous ulcer on her left lower leg. It has been present for a little over 2 weeks. She saw her primary care provider who apparently swabbed the wound and grew out Pseudomonas. She just completed a course of ciprofloxacin for this. ABI was 0.91. She reports that she has had previous issues with ulcers in this same location, stemming back to a punch biopsy taken by a dermatologist many years ago. She has had several skin substitutes applied to the area that have  ultimately resulted in healing on prior occasions. She has 2 small ulcers on her left medial lower leg. There is slough accumulation in both of them. The more anterior of the 2 is quite small and has some soft slough on the surface, underneath which there is good granulation tissue. The more medial wound also has slough accumulation, but the underlying surface is fairly fibrotic and gritty. This is consistent with her provided history of multiple ulcers in  the same location. 08/03/2022: The anterior wound is smaller today with just a little bit of slough accumulation. The more medial wound continues to be very sensitive and fibrotic with slough buildup. 08/13/2022: The anterior wound is closed. The more medial wound remains sensitive with a fairly fibrotic surface. There is more granulation tissue filling in, however, and there is less slough than on prior occasions. 08/20/2022: The anterior wound remains closed. The more medial wound has filled with granulation tissue but still has a fair amount of slough on the surface and remains fairly tender. Unfortunately, she has developed 2 small ulcers just proximal to this. The fat layer is exposed in each. She says that over the weekend, she felt a burning sensation in that location. 08/27/2022: The 2 small wounds proximal to the main wound have merged into a single site. The wounds look a little bit dry, but they are quite clean without significant slough accumulation. They remain quite tender. 09/03/2022: All of the wounds have now merged into 1 large wound. There is a strong odor coming from the wound and the surface is not particularly viable. It is extremely painful for her today. 09/10/2022: The wound is less black and purple this week but still does not look particularly viable. The surface is desiccated. The culture that I took last week returned with a polymicrobial population including Pseudomonas. I prescribed both Augmentin and levofloxacin. She  has not yet picked up levofloxacin, as her pharmacy only notified her yesterday that it was ready. We have ordered a Keystone topical compounded antibiotic, but it has not yet come. 09/17/2022: The wound surface is markedly improved today. There is still an area of grayish-looking muscle but the rest appears significantly more viable. There is still a layer of slough on the surface. She still has a couple days left of oral antibiotic therapy. She has her Jodie Echevaria compounded topical antibiotic with her today. 09/24/2022: She has completed her oral antibiotic therapy. The wound surface is much cleaner today and more viable without any necrotic tissue. It is a bit desiccated, however. 10/01/2022: The moisture balance of the wound has improved. There is a layer of yellow slough on the surface, but beneath this, the wound is more pink. 10/08/2022: The wound is smaller and cleaner today with a layer of slough present. She is having less pain. 12/18; this patient has a new wound on the right lateral ankle which apparently started after a cat scratched her. Secondarily she managed to drop a cake pan on the same area. This is very painful. The original wound was on the left posterior calf we have been using [employer] under an [employer]. The Unna boot fell down and apparently she was in for a nurse visit perhaps last week and that 1 fell down as well This patient has central venous mediated hypertension. For member correctly she has a stent placed in the left common iliac artery by Dr. Randie Heinz some years ago [facility] Thurner syndrome] she also has had venous ablations and I believe a history of a DVT Amber, [name] ([id number]) [id number] Page 3 of 11 The patient has compression pumps at home but she does not use them 10/30/2022: The patient says that her wounds burned all week with the [facility] classic in place. She has a fair amount of slough and nonviable subcutaneous  tissue present on the right ankle. The left posterior calf wound is cleaner than I have seen it to date. Both remain fairly tender. 11/14/2022: Both  wounds have substantial slough and eschar accumulation. They remain extremely tender. 11/21/2022: The wounds are cleaner this week, but still have slough and eschar accumulation. She continues to describe burning pain in both wounds, but upon further questioning, she actually has burning in both of her feet that radiates up her legs and includes to the wounds. 11/28/2022: Both wounds have slough and eschar accumulation, as well as adherent silver alginate that was difficult to remove. She continues to have pain out of proportion to the extent of her wounds. Her PCP did initiate Lyrica which she started taking last night. The dose is quite low, only 25 mg, but apparently there is a plan for upward titration, assuming she tolerates the medication. 12/05/2022: The wounds are both a little bit smaller today, but have significant slough accumulation, as usual. They remain exquisitely tender. She is now taking Lyrica twice a day. 12/19/2022: Both of her wounds look significantly better this week. There is slough buildup on both, but they are smaller. She seems to be getting a good response from Lyrica as she is having much less pain. 12/26/2022: The wound on her right lateral ankle is nearly closed. The wound on her left posterior calf is smaller but still has a lot of slough accumulation. They remain tender. 01/02/2023: The right lateral ankle wound is down to just a couple of millimeters. It is much less tender than on prior occasions. There is minimal slough buildup. The wound on her posterior calf continues to contract, as well. It has thick slough buildup and remains fairly tender. 01/09/2023: The right lateral ankle wound is nearly closed. He remains tender. There is just a little eschar on the surface. The wound on her posterior calf has improved quite a bit.  There is still slough on the surface, but the more proximal area has epithelialized substantially and there is no longer any gray discoloration to her tissue. 3/13; patient presents for follow-up. Her right lateral ankle wound is almost healed. She has a wound to her left posterior calf with slough and granulation tissue. We have been using Santyl under Unna boots bilaterally to the lower extremities. She has no issues or complaints today. 01/30/2023: The right lateral ankle wound is down to just a pinhole under a layer of eschar. The left posterior calf wound is quite a bit improved since the last time I saw it. There is still a layer of slough on the surface but there has been more epithelialization. 02/07/2023: The right lateral ankle wound still has some eschar on the surface. The left posterior calf wound is about the same size, but with more areas of epithelialization. 02/20/2023: The right lateral ankle wound is healed. The left posterior calf wound is smaller and is essentially flush with the surrounding skin, but the surface is quite dry. Her leg wrap slipped and her calf is quite swollen above the level of the wound. 03/01/2023: The right lateral ankle wound reopened and she also suffered a fall that resulted in a small wound on her right anterior tibial surface. The left posterior calf wound has a much better moisture balance today. There is a layer of slough on the surface. The [employer] worked much better for her as far as compression this week. 03/06/2023: The right lateral ankle wound has a layer of eschar on the surface and is too painful for me to debride. The anterior tibial surface wound is more or less healed; it is a very superficial abrasion at this point. The  left posterior calf wound has a layer of slough on the surface. Her edema control is better. 03/13/2023: The right lateral ankle wound has a layer of eschar on the surface. Once this was debrided, it was found to be healed. The  anterior tibial surface wound has a layer of stuck silver alginate on the surface. This was removed to reveal a very superficial abrasion but not much change from last week. The left posterior calf wound is smaller and has its usual layer of accumulated slough. 03/25/2023: The anterior tibial surface wound has eschar and silver alginate stuck to it. Once this was removed, the wound was found to be healed. The left posterior calf wound is roughly the same size and has a little bit thicker layer of accumulated slough. 04/03/2023: She has had more drainage from the posterior calf wound over the last week and the layer of slough is thicker. She also says it is more tender. 04/10/2023: Continued increase in drainage from her wound. The wound remains tender with a layer of slough on the surface. It does not appear as viable this week. She also has erythema, swelling, and warmth in her entire left lower leg extending into the midfoot. 04/17/2023: The wound is about the same size this week. There is fibrotic adherent slough, but the drainage is not as profuse. She is currently taking levofloxacin. She is complaining of an increase in the burning sensation at her wound. 04/24/2023: The wound measured smaller this week and is much cleaner-looking. She completed her course of levofloxacin. Her PCP increased her dose of Lyrica and she reports that the burning has stopped. 05/01/2023: The wound is smaller again today and quite clean with only minimal slough on the surface. Electronic Signature(s) Signed: 05/01/2023 2:51:15 PM By: Amber Guess MD FACS Entered By: [name] on 05/01/2023 14:51:15 -------------------------------------------------------------------------------- Physical Exam Details Patient Name: Date of Service: SHERAN, NEWSTROM 05/01/2023 2:00 PM Medical Record Number: [id number] Patient Account Number: [id number] Date of Birth/Sex: Treating RN: [birth year] (77 y.o. Amber, [name]  ([id number]) [id number] Page 4 of 11 Primary Care Provider: Eustaquio [name] Other Clinician: Referring Provider: Treating Provider/Extender: [name] in Treatment: 39 Constitutional Hypertensive, asymptomatic. . . . no acute distress. Respiratory Normal work of breathing on room air. Notes 05/01/2023: The wound is smaller again today and quite clean with only minimal slough on the surface. Electronic Signature(s) Signed: 05/01/2023 2:56:49 PM By: Amber Guess MD FACS Entered By: [name] on 05/01/2023 14:56:48 -------------------------------------------------------------------------------- Physician Orders Details Patient Name: Date of Service: Amber, [name] 05/01/2023 2:00 PM Medical Record Number: [id number] Patient Account Number: [id number] Date of Birth/Sex: Treating RN: 07-02-1946 (77 y.o. [name] Primary Care Provider: Eustaquio [name] Other Clinician: Referring Provider: Treating Provider/Extender: [name] in Treatment: 19 Verbal / Phone Orders: No Diagnosis Coding ICD-10 Coding Code Description [id number] Non-pressure chronic ulcer of other part of left lower leg with fat layer exposed I83.893 Varicose veins of bilateral lower extremities with other complications I87.2 Venous insufficiency (chronic) (peripheral) G62.9 Polyneuropathy, unspecified R60.0 Localized edema Follow-up Appointments ppointment in 1 week. - Dr. Lady Gary Room 3 Return A Anesthetic Wound #2 Left,Posterior Lower Leg (In clinic) Topical Lidocaine 4% applied to wound bed Bathing/ Shower/ Hygiene May shower with protection but do not get wound dressing(s) wet. Protect dressing(s) with water repellant cover (for example, large plastic bag) or a cast cover and may then take shower. Edema Control - Lymphedema / SCD / Other Left Lower  Extremity Lymphedema Pumps. Use Lymphedema pumps on  leg(s) 2-3 times a day for 45-60 minutes. If wearing any wraps or hose, do not remove them. Continue exercising as instructed. - Use x1 per day Avoid standing for long periods of time. Exercise regularly Off-Loading Wound #2 Left,Posterior Lower Leg Other: - Elevate legs at heart level or above heart level while sitting. Wound Treatment Wound #2 - Lower Leg Wound Laterality: Left, Posterior Cleanser: Soap and Water 1 x Per Week/30 Days Discharge Instructions: May shower and wash wound with dial antibacterial soap and water prior to dressing change. Peri-Wound Care: Sween Lotion (Moisturizing lotion) 1 x Per Week/30 Days Amber, [name] ([id number]) [id number] Page 5 of 11 Discharge Instructions: Apply moisturizing lotion as directed Topical: Gentamicin 1 x Per Week/30 Days Discharge Instructions: As directed by physician Topical: Mupirocin Ointment 1 x Per Week/30 Days Discharge Instructions: Apply Mupirocin (Bactroban) as instructed Topical: Skintegrity Hydrogel 4 (oz) 1 x Per Week/30 Days Discharge Instructions: Apply hydrogel as directed Prim Dressing: Hydrofera Blue Classic Foam, 4x4 in 1 x Per Week/30 Days ary Discharge Instructions: Moisten with saline prior to applying to wound bed Secondary Dressing: Zetuvit Plus 4x8 in 1 x Per Week/30 Days Discharge Instructions: Apply over primary dressing as directed. Secured With: 25M Medipore Scientist, research (life sciences) Surgical T 2x10 (in/yd) 1 x Per Week/30 Days ape Discharge Instructions: Secure with tape as directed. Compression Wrap: Unnaboot w/Calamine, 4x10 (in/yd) 1 x Per Week/30 Days Discharge Instructions: Apply Unnaboot as directed. Electronic Signature(s) Signed: 05/01/2023 3:11:40 PM By: Amber Guess MD FACS Entered By: [name] on 05/01/2023 14:57:09 -------------------------------------------------------------------------------- Problem List Details Patient Name: Date of Service: Amber, [name]  05/01/2023 2:00 PM Medical Record Number: [id number] Patient Account Number: [id number] Date of Birth/Sex: Treating RN: August 31, 1946 (77 y.o. [name] Primary Care Provider: Eustaquio [name] Other Clinician: Referring Provider: Treating Provider/Extender: [name] in Treatment: 39 Active Problems ICD-10 Encounter Code Description Active Date MDM Diagnosis [phone] Non-pressure chronic ulcer of other part of left lower leg with fat layer exposed 07/27/2022 No Yes I83.893 Varicose veins of bilateral lower extremities with other complications 07/27/2022 No Yes I87.2 Venous insufficiency (chronic) (peripheral) 07/27/2022 No Yes G62.9 Polyneuropathy, unspecified 07/27/2022 No Yes R60.0 Localized edema 07/27/2022 No Yes Inactive Problems ICD-10 Amber, [name] ([id number]) [id number] Page 6 of 11 Code Description Active Date Inactive Date L03.115 Cellulitis of right lower limb 10/22/2022 10/22/2022 L97.818 Non-pressure chronic ulcer of other part of right lower leg with other specified severity 03/01/2023 10/22/2022 Resolved Problems Electronic Signature(s) Signed: 05/01/2023 2:48:59 PM By: Amber Guess MD FACS Entered By: [name] on 05/01/2023 14:48:58 -------------------------------------------------------------------------------- Progress Note Details Patient Name: Date of Service: [name] 05/01/2023 2:00 PM Medical Record Number: [id number] Patient Account Number: [id number] Date of Birth/Sex: Treating RN: [birth year] (77 y.o. F) Primary Care Provider: Eustaquio [name] Other Clinician: Referring Provider: Treating Provider/Extender: [name] in Treatment: 39 Subjective Chief Complaint Information obtained from Patient Patient presents for treatment of an open ulcer due to venous insufficiency History of Present Illness (HPI) ADMISSION This is a 77 year old woman with  a history of chronic venous insufficiency status post saphenous vein ablations in 2010 and 2016. 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 She presents today with an open venous ulcer on her left lower leg. It has been present for a little over 2 weeks. She saw her primary care provider who apparently swabbed the wound and grew out Pseudomonas. She just  completed a course of ciprofloxacin for this. ABI was 0.91. She reports that she has had previous issues with ulcers in this same location, stemming back to a punch biopsy taken by a dermatologist many years ago. She has had several skin substitutes applied to the area that have ultimately resulted in healing on prior occasions. She has 2 small ulcers on her left medial lower leg. There is slough accumulation in both of them. The more anterior of the 2 is quite small and has some soft slough on the surface, underneath which there is good granulation tissue. The more medial wound also has slough accumulation, but the underlying surface is fairly fibrotic and gritty. This is consistent with her provided history of multiple ulcers in the same location. 08/03/2022: The anterior wound is smaller today with just a little bit of slough accumulation. The more medial wound continues to be very sensitive and fibrotic with slough buildup. 08/13/2022: The anterior wound is closed. The more medial wound remains sensitive with a fairly fibrotic surface. There is more granulation tissue filling in, however, and there is less slough than on prior occasions. 08/20/2022: The anterior wound remains closed. The more medial wound has filled with granulation tissue but still has a fair amount of slough on the surface and remains fairly tender. Unfortunately, she has developed 2 small ulcers just proximal to this. The fat layer is exposed in each. She says that over the weekend, she felt a burning sensation in that location. 08/27/2022: The 2 small  wounds proximal to the main wound have merged into a single site. The wounds look a little bit dry, but they are quite clean without significant slough accumulation. They remain quite tender. 09/03/2022: All of the wounds have now merged into 1 large wound. There is a strong odor coming from the wound and the surface is not particularly viable. It is extremely painful for her today. 09/10/2022: The wound is less black and purple this week but still does not look particularly viable. The surface is desiccated. The culture that I took last week returned with a polymicrobial population including Pseudomonas. I prescribed both Augmentin and levofloxacin. She has not yet picked up levofloxacin, as her pharmacy only notified her yesterday that it was ready. We have ordered a Keystone topical compounded antibiotic, but it has not yet come. 09/17/2022: The wound surface is markedly improved today. There is still an area of grayish-looking muscle but the rest appears significantly more viable. There is still a layer of slough on the surface. She still has a couple days left of oral antibiotic therapy. She has her Jodie Echevaria compounded topical antibiotic with her today. 09/24/2022: She has completed her oral antibiotic therapy. The wound surface is much cleaner today and more viable without any necrotic tissue. It is a bit desiccated, however. 10/01/2022: The moisture balance of the wound has improved. There is a layer of yellow slough on the surface, but beneath this, the wound is more pink. 10/08/2022: The wound is smaller and cleaner today with a layer of slough present. She is having less pain. 12/18; this patient has a new wound on the right lateral ankle which apparently started after a cat scratched her. Secondarily she managed to drop a cake pan on the same area. This is very painful. Amber, [name] ([id number]) [id number] Page 7 of 11 The original wound was on the left  posterior calf we have been using [employer] under an [employer]. The Unna boot fell down and apparently  she was in for a nurse visit perhaps last week and that 1 fell down as well This patient has central venous mediated hypertension. For member correctly she has a stent placed in the left common iliac artery by Dr. Randie Heinz some years ago [facility] Thurner syndrome] she also has had venous ablations and I believe a history of a DVT The patient has compression pumps at home but she does not use them 10/30/2022: The patient says that her wounds burned all week with the [facility] classic in place. She has a fair amount of slough and nonviable subcutaneous tissue present on the right ankle. The left posterior calf wound is cleaner than I have seen it to date. Both remain fairly tender. 11/14/2022: Both wounds have substantial slough and eschar accumulation. They remain extremely tender. 11/21/2022: The wounds are cleaner this week, but still have slough and eschar accumulation. She continues to describe burning pain in both wounds, but upon further questioning, she actually has burning in both of her feet that radiates up her legs and includes to the wounds. 11/28/2022: Both wounds have slough and eschar accumulation, as well as adherent silver alginate that was difficult to remove. She continues to have pain out of proportion to the extent of her wounds. Her PCP did initiate Lyrica which she started taking last night. The dose is quite low, only 25 mg, but apparently there is a plan for upward titration, assuming she tolerates the medication. 12/05/2022: The wounds are both a little bit smaller today, but have significant slough accumulation, as usual. They remain exquisitely tender. She is now taking Lyrica twice a day. 12/19/2022: Both of her wounds look significantly better this week. There is slough buildup on both, but they are smaller. She seems to be getting a good response from Lyrica as she is  having much less pain. 12/26/2022: The wound on her right lateral ankle is nearly closed. The wound on her left posterior calf is smaller but still has a lot of slough accumulation. They remain tender. 01/02/2023: The right lateral ankle wound is down to just a couple of millimeters. It is much less tender than on prior occasions. There is minimal slough buildup. The wound on her posterior calf continues to contract, as well. It has thick slough buildup and remains fairly tender. 01/09/2023: The right lateral ankle wound is nearly closed. He remains tender. There is just a little eschar on the surface. The wound on her posterior calf has improved quite a bit. There is still slough on the surface, but the more proximal area has epithelialized substantially and there is no longer any gray discoloration to her tissue. 3/13; patient presents for follow-up. Her right lateral ankle wound is almost healed. She has a wound to her left posterior calf with slough and granulation tissue. We have been using Santyl under Unna boots bilaterally to the lower extremities. She has no issues or complaints today. 01/30/2023: The right lateral ankle wound is down to just a pinhole under a layer of eschar. The left posterior calf wound is quite a bit improved since the last time I saw it. There is still a layer of slough on the surface but there has been more epithelialization. 02/07/2023: The right lateral ankle wound still has some eschar on the surface. The left posterior calf wound is about the same size, but with more areas of epithelialization. 02/20/2023: The right lateral ankle wound is healed. The left posterior calf wound is smaller and is essentially flush with  the surrounding skin, but the surface is quite dry. Her leg wrap slipped and her calf is quite swollen above the level of the wound. 03/01/2023: The right lateral ankle wound reopened and she also suffered a fall that resulted in a small wound on her right  anterior tibial surface. The left posterior calf wound has a much better moisture balance today. There is a layer of slough on the surface. The [employer] worked much better for her as far as compression this week. 03/06/2023: The right lateral ankle wound has a layer of eschar on the surface and is too painful for me to debride. The anterior tibial surface wound is more or less healed; it is a very superficial abrasion at this point. The left posterior calf wound has a layer of slough on the surface. Her edema control is better. 03/13/2023: The right lateral ankle wound has a layer of eschar on the surface. Once this was debrided, it was found to be healed. The anterior tibial surface wound has a layer of stuck silver alginate on the surface. This was removed to reveal a very superficial abrasion but not much change from last week. The left posterior calf wound is smaller and has its usual layer of accumulated slough. 03/25/2023: The anterior tibial surface wound has eschar and silver alginate stuck to it. Once this was removed, the wound was found to be healed. The left posterior calf wound is roughly the same size and has a little bit thicker layer of accumulated slough. 04/03/2023: She has had more drainage from the posterior calf wound over the last week and the layer of slough is thicker. She also says it is more tender. 04/10/2023: Continued increase in drainage from her wound. The wound remains tender with a layer of slough on the surface. It does not appear as viable this week. She also has erythema, swelling, and warmth in her entire left lower leg extending into the midfoot. 04/17/2023: The wound is about the same size this week. There is fibrotic adherent slough, but the drainage is not as profuse. She is currently taking levofloxacin. She is complaining of an increase in the burning sensation at her wound. 04/24/2023: The wound measured smaller this week and is much cleaner-looking. She completed  her course of levofloxacin. Her PCP increased her dose of Lyrica and she reports that the burning has stopped. 05/01/2023: The wound is smaller again today and quite clean with only minimal slough on the surface. Patient History Information obtained from Patient. Family History Cancer - Mother,Paternal Grandparents, Diabetes - Siblings, Heart Disease - Father, Hypertension - Father. Social History Former smoker - quit in 1967. Medical History Eyes Patient has history of Cataracts - L eye Ear/Nose/Mouth/Throat Denies history of Chronic sinus problems/congestion, Middle ear problems Hematologic/Lymphatic Amber, [name] ([id number]) [id number] Page 8 of 11 Denies history of Anemia, Human Immunodeficiency Virus, Lymphedema Respiratory Patient has history of Asthma Cardiovascular Patient has history of Hypertension Endocrine Denies history of Type I Diabetes, Type II Diabetes Neurologic Patient has history of Neuropathy - Feet and finger Denies history of Seizure Disorder Oncologic Patient has history of Received Chemotherapy - 2012, Received Radiation - 2012 Hospitalization/Surgery History - Lower extremity venography 2020. - Intravascular ultrasound. - peripheral vascular intervention. - melanoma 2011. 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 no acute distress. Vitals Time  Taken: 2:07 PM, Height: 63 in, Weight: 200 lbs, BMI: 35.4, Temperature: 98.2 F, Pulse: 65 bpm, Respiratory Rate: 18 breaths/min, Blood Pressure: 159/105 mmHg. Respiratory Normal work of breathing on room air. General Notes: 05/01/2023: The wound is smaller again today and quite clean with only minimal slough on the  surface. Integumentary (Hair, Skin) Wound #2 status is Open. Original cause of wound was Blister. The date acquired was: 07/06/2022. The wound has been in treatment 39 weeks. The wound is located on the Left,Posterior Lower Leg. The wound measures 4cm length x 2.3cm width x 0.2cm depth; 7.226cm^2 area and 1.445cm^3 volume. There is Fat Layer (Subcutaneous Tissue) exposed. There is no tunneling or undermining noted. There is a medium amount of serosanguineous drainage noted. The wound margin is distinct with the outline attached to the wound base. There is medium (34-66%) red granulation within the wound bed. There is a medium (34-66%) amount of necrotic tissue within the wound bed including Eschar and Adherent Slough. The periwound skin appearance had no abnormalities noted for texture. The periwound skin appearance exhibited: Maceration, Rubor. The periwound skin appearance did not exhibit: Dry/Scaly. Periwound temperature was noted as No Abnormality. The periwound has tenderness on palpation. Assessment Active Problems ICD-10 Non-pressure chronic ulcer of other part of left lower leg with fat layer exposed Varicose veins of bilateral lower extremities with other complications Venous insufficiency (chronic) (peripheral) Polyneuropathy, unspecified Localized edema Procedures Wound #2 Pre-procedure diagnosis of Wound #2 is a Venous Leg Ulcer located on the Left,Posterior Lower Leg .Severity of Tissue Pre Debridement is: Fat layer exposed. There was a Selective/Open Wound Non-Viable Tissue Debridement with a total area of 7.22 sq cm performed by Amber Guess, MD. With the following instrument(s): Curette to remove Non-Viable tissue/material. Material removed includes [facility] after achieving pain control using Lidocaine 4% Topical Solution. No specimens were taken. A time out was conducted at 14:30, prior to the start of the procedure. A Minimum amount of bleeding was controlled with Pressure.  The procedure was tolerated well with a pain level of 0 throughout and a pain level of 0 following the procedure. Post Debridement Measurements: Amber, [name] ([id number]) [id number] Page 9 of 11 4cm length x 2.3cm width x 0.2cm depth; 1.445cm^3 volume. Character of Wound/Ulcer Post Debridement is improved. Severity of Tissue Post Debridement is: Fat layer exposed. Post procedure Diagnosis Wound #2: Same as Pre-Procedure General Notes: Scribed for Dr Lady Gary by Amber Grills RN.. Pre-procedure diagnosis of Wound #2 is a Venous Leg Ulcer located on the Left,Posterior Lower Leg . There was a Three Layer Compression Therapy Procedure by Amber Grills, RN. Post procedure Diagnosis Wound #2: Same as Pre-Procedure Plan Follow-up Appointments: Return Appointment in 1 week. - Dr. Lady Gary Room 3 Anesthetic: Wound #2 Left,Posterior Lower Leg: (In clinic) Topical Lidocaine 4% applied to wound bed Bathing/ Shower/ Hygiene: May shower with protection but do not get wound dressing(s) wet. Protect dressing(s) with water repellant cover (for example, large plastic bag) or a cast cover and may then take shower. Edema Control - Lymphedema / SCD / Other: Lymphedema Pumps. Use Lymphedema pumps on leg(s) 2-3 times a day for 45-60 minutes. If wearing any wraps or hose, do not remove them. Continue exercising as instructed. - Use x1 per day Avoid standing for long periods of time. Exercise regularly Off-Loading: Wound #2 Left,Posterior Lower Leg: Other: - Elevate legs at heart level or above heart level while sitting. WOUND #2: - Lower Leg Wound Laterality: Left, Posterior Cleanser: Soap and Water 1  x Per Week/30 Days Discharge Instructions: May shower and wash wound with dial antibacterial soap and water prior to dressing change. Peri-Wound Care: Sween Lotion (Moisturizing lotion) 1 x Per Week/30 Days Discharge Instructions: Apply moisturizing lotion as directed Topical:  Gentamicin 1 x Per Week/30 Days Discharge Instructions: As directed by physician Topical: Mupirocin Ointment 1 x Per Week/30 Days Discharge Instructions: Apply Mupirocin (Bactroban) as instructed Topical: Skintegrity Hydrogel 4 (oz) 1 x Per Week/30 Days Discharge Instructions: Apply hydrogel as directed Prim Dressing: Hydrofera Blue Classic Foam, 4x4 in 1 x Per Week/30 Days ary Discharge Instructions: Moisten with saline prior to applying to wound bed Secondary Dressing: Zetuvit Plus 4x8 in 1 x Per Week/30 Days Discharge Instructions: Apply over primary dressing as directed. Secured With: 46M Medipore Scientist, research (life sciences) Surgical T 2x10 (in/yd) 1 x Per Week/30 Days ape Discharge Instructions: Secure with tape as directed. Com pression Wrap: Unnaboot w/Calamine, 4x10 (in/yd) 1 x Per Week/30 Days Discharge Instructions: Apply Unnaboot as directed. 05/01/2023: The wound is smaller again today and quite clean with only minimal slough on the surface. I used a curette to debride the slough from her wound. Will continue the mixture of topical gentamicin and mupirocin followed by Hydrofera Blue classic. I am going to add some hydrogel to the [facility] to try and improve the moisture balance in her wound. Continue calamine based Radio broadcast assistant. Follow-up in 1 week. Electronic Signature(s) Signed: 05/01/2023 2:57:55 PM By: Amber Guess MD FACS Entered By: [name] on 05/01/2023 14:57:54 -------------------------------------------------------------------------------- HxROS Details Patient Name: Date of Service: [name], ALLCORN 05/01/2023 2:00 PM Medical Record Number: [id number] Patient Account Number: [id number] Date of Birth/Sex: Treating RN: [birth year] (77 y.o. F) Primary Care Provider: Eustaquio [name] Other Clinician: Referring Provider: Treating Provider/Extender: [name] in Treatment: [address], [place] ([id number]) [id number]  Page 10 of 11 Information Obtained From Patient Eyes Medical History: Positive for: Cataracts - L eye Ear/Nose/Mouth/Throat Medical History: Negative for: Chronic sinus problems/congestion; Middle ear problems Hematologic/Lymphatic Medical History: Negative for: Anemia; Human Immunodeficiency Virus; Lymphedema Respiratory Medical History: Positive for: Asthma Past Medical History Notes: OSA on CPAP Cardiovascular Medical History: Positive for: Hypertension Past Medical History Notes: Cronic venous insufficiency 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 Gastrointestinal Medical History: Past Medical History Notes: liver cyst Endocrine Medical History: Negative for: Type I Diabetes; Type II Diabetes Musculoskeletal Medical History: Past Medical History Notes: arthritis Neurologic Medical History: Positive for: Neuropathy - Feet and finger Negative for: Seizure Disorder Past Medical History Notes: restless leg syndrome Oncologic Medical History: Positive for: Received Chemotherapy - 2012; Received Radiation - 2012 HBO Extended History Items Eyes: Cataracts Immunizations Pneumococcal Vaccine: Received Pneumococcal Vaccination: Yes Received Pneumococcal Vaccination On or After 60th Birthday: Yes Implantable Devices No devices added Hospitalization / Surgery History Type of Hospitalization/Surgery MICHIAH, MASSE ([id number]) [id number] Page 11 of 11 Lower extremity venography 2020 Intravascular ultrasound peripheral vascular intervention melanoma 2011 Family and Social History Cancer: Yes - Mother,Paternal Grandparents; Diabetes: Yes - Siblings; Heart Disease: Yes - Father; Hypertension: Yes - Father; Former smoker - quit in [employer]) Signed: 05/01/2023 3:11:40 PM By: Amber Guess MD FACS Entered By: [name] on 05/01/2023  14:56:26 -------------------------------------------------------------------------------- SuperBill Details Patient Name: Date of Service: SREYA, FROIO 05/01/2023 Medical Record Number: [id number] Patient Account Number: [id number] Date of Birth/Sex: Treating RN: 13-Oct-1946 (77 y.o. [name] Primary Care Provider: Eustaquio [name] Other Clinician: Referring Provider: Treating Provider/Extender: Katharina Caper Weeks in Treatment: [phone]  Diagnosis Coding ICD-10 Codes Code Description [id number] Non-pressure chronic ulcer of other part of left lower leg with fat layer exposed I83.893 Varicose veins of bilateral lower extremities with other complications I87.2 Venous insufficiency (chronic) (peripheral) G62.9 Polyneuropathy, unspecified R60.0 Localized edema Facility Procedures : CPT4 Code: [id number] Description: 41324 - DEBRIDE WOUND 1ST 20 SQ CM OR < ICD-10 Diagnosis Description L97.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 Modifier: d Quantity: 1 Physician Procedures : CPT4 Code Description Modifier [id number] - WC PHYS DEBR WO ANESTH 20 SQ CM ICD-10 Diagnosis Description L97.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d Quantity: 1 Electronic Signature(s) Signed: 05/01/2023 3:11:40 PM By: Amber Guess MD FACS Signed: 05/02/2023 12:38:14 PM By: [name] Entered By: [name] on 05/01/2023 14:36:10

## 2023-05-02 NOTE — Progress Notes (Signed)
MAISON, AGRUSA ([id number]) [id number] Page 1 of 8 Visit Report for 05/01/2023 Arrival Information Details Patient Name: Date of Service: [name], [name] 05/01/2023 2:00 PM Medical Record Number: [id number] Patient Account Number: [id number] Date of Birth/Sex: Treating RN: 1946-07-22 (77 y.o. [name] Primary Care Tiler Brandis: Eustaquio Boyden Other Clinician: Referring Maranda Marte: Treating Quantisha Marsicano/Extender: [name] in Treatment: 39 Visit Information History Since Last Visit All ordered tests and consults were completed: Yes Patient Arrived: Ambulatory Added or deleted any medications: No Arrival Time: 14:05 Any new allergies or adverse reactions: No Accompanied By: self Had a fall or experienced change in No Transfer Assistance: None activities of daily living that may affect Patient Requires Transmission-Based Precautions: No risk of falls: Patient Has Alerts: No Signs or symptoms of abuse/neglect since last visito No Hospitalized since last visit: No Implantable device outside of the clinic excluding No cellular tissue based products placed in the center since last visit: Has Dressing in Place as Prescribed: Yes Pain Present Now: No Electronic Signature(s) Signed: 05/02/2023 12:38:14 PM By: Brenton Grills Entered By: Brenton Grills on 05/01/2023 14:07:28 -------------------------------------------------------------------------------- Compression Therapy Details Patient Name: Date of Service: [name] 05/01/2023 2:00 PM Medical Record Number: [id number] Patient Account Number: [id number] Date of Birth/Sex: Treating RN: 29-May-1946 (77 y.o. [name] Primary Care Wyman Meschke: Eustaquio Boyden Other Clinician: Referring Mahari Strahm: Treating Shermika Balthaser/Extender: [name] in Treatment: 39 Compression Therapy Performed for Wound Assessment: Wound #2 Left,Posterior Lower  Leg Performed By: [name] Parody, RN Compression Type: Three Layer Post Procedure Diagnosis Same as Pre-procedure Electronic Signature(s) Signed: 05/02/2023 12:38:14 PM By: Brenton Grills Entered By: Brenton Grills on 05/01/2023 14:35:55 [name] ([id number]) [id number] Page 2 of 8 -------------------------------------------------------------------------------- Encounter Discharge Information Details Patient Name: Date of Service: [name], [name] 05/01/2023 2:00 PM Medical Record Number: [id number] Patient Account Number: [id number] Date of Birth/Sex: Treating RN: 29-Sep-1946 (77 y.o. [name] Primary Care Shaleah Nissley: Eustaquio Boyden Other Clinician: Referring Ravenna Legore: Treating Lenzy Kerschner/Extender: [name] in Treatment: 39 Encounter Discharge Information Items Post Procedure Vitals Discharge Condition: Stable Temperature (F): 98.3 Ambulatory Status: Ambulatory Pulse (bpm): 80 Discharge Destination: Home Respiratory Rate (breaths/min): 18 Transportation: Private Auto Blood Pressure (mmHg): 136/72 Accompanied By: self Schedule Follow-up Appointment: Yes Clinical Summary of Care: Patient Declined Electronic Signature(s) Signed: 05/02/2023 12:38:14 PM By: Brenton Grills Entered By: Brenton Grills on 05/01/2023 14:52:47 -------------------------------------------------------------------------------- Lower Extremity Assessment Details Patient Name: Date of Service: [name], [name] 05/01/2023 2:00 PM Medical Record Number: [id number] Patient Account Number: [id number] Date of Birth/Sex: Treating RN: 18-May-1946 (77 y.o. [name] Primary Care Levonne Carreras: Eustaquio Boyden Other Clinician: Referring Offie Waide: Treating Anushree Dorsi/Extender: Katharina Caper Weeks in Treatment: 39 Edema Assessment Assessed: [name]: No] Franne Forts: No] Edema: [Left: Ye] [Right: s] Calf Left:  Right: Point of Measurement: 31 cm From Medial Instep 44.7 cm Ankle Left: Right: Point of Measurement: 9 cm From Medial Instep 25 cm Vascular Assessment Pulses: Dorsalis Pedis Palpable: [Left:Yes] Electronic Signature(s) Signed: 05/02/2023 12:38:14 PM By: Brenton Grills Entered By: Brenton Grills on 05/01/2023 14:11:59 Multi Wound Chart Details -------------------------------------------------------------------------------- [name] ([id number]) [id number] Page 3 of 8 Patient Name: Date of Service: [name], [name] 05/01/2023 2:00 PM Medical Record Number: [id number] Patient Account Number: [id number] Date of Birth/Sex: Treating RN: 02-Jun-1946 (77 y.o. F) Primary Care Kamir Selover: Eustaquio Boyden Other Clinician: Referring Terre Zabriskie: Treating Klay Sobotka/Extender: [name] in Treatment: 39 Vital Signs Height(in): 63 Pulse(bpm): 65 Weight(lbs): 200 Blood Pressure(mmHg):  159/105 Body Mass Index(BMI): 35.4 Temperature(F): 98.2 Respiratory Rate(breaths/min): 18 Wound Assessments Wound Number: 2 N/A N/A Photos: N/A N/A Left, Posterior Lower Leg N/A N/A Wound Location: Blister N/A N/A Wounding Event: Venous Leg Ulcer N/A N/A Primary Etiology: Cataracts, Asthma, Hypertension, N/A N/A Comorbid History: Neuropathy, Received Chemotherapy, Received Radiation 07/06/2022 N/A N/A Date Acquired: 4 N/A N/A Weeks of Treatment: Open N/A N/A Wound Status: No N/A N/A Wound Recurrence: 4x2.3x0.2 N/A N/A Measurements L x W x D (cm) 7.226 N/A N/A A (cm) : rea 1.445 N/A N/A Volume (cm) : -5.20% N/A N/A % Reduction in A rea: -110.30% N/A N/A % Reduction in Volume: Full Thickness Without Exposed N/A N/A Classification: Support Structures Medium N/A N/A Exudate A mount: Serosanguineous N/A N/A Exudate Type: red, brown N/A N/A Exudate Color: Distinct, outline attached N/A N/A Wound Margin: Medium (34-66%) N/A  N/A Granulation A mount: Red N/A N/A Granulation Quality: Medium (34-66%) N/A N/A Necrotic A mount: Eschar, Adherent Slough N/A N/A Necrotic Tissue: Fat Layer (Subcutaneous Tissue): Yes N/A N/A Exposed Structures: Fascia: No Tendon: No Muscle: No Joint: No Bone: No Small (1-33%) N/A N/A Epithelialization: Debridement - Selective/Open Wound N/A N/A Debridement: Pre-procedure Verification/Time Out 14:30 N/A N/A Taken: Lidocaine 4% Topical Solution N/A N/A Pain Control: Slough N/A N/A Tissue Debrided: Non-Viable Tissue N/A N/A Level: 7.22 N/A N/A Debridement A (sq cm): rea Curette N/A N/A Instrument: Minimum N/A N/A Bleeding: Pressure N/A N/A Hemostasis A chieved: 0 N/A N/A Procedural Pain: 0 N/A N/A Post Procedural Pain: Procedure was tolerated well N/A N/A Debridement Treatment Response: 4x2.3x0.2 N/A N/A Post Debridement Measurements L x W x D (cm) 1.445 N/A N/A Post Debridement Volume: (cm) Scarring: Yes N/A N/A Periwound Skin Texture: Maceration: Yes N/A N/A Periwound Skin Moisture: Dry/Scaly: No Rubor: Yes N/A N/A Periwound Skin Color: No Abnormality N/A N/A Temperature: Yes N/A N/A Tenderness on Palpation: Compression Therapy N/A N/A Procedures Performed: Debridement [name], [name] ([id number]) [id number] Page 4 of 8 Treatment Notes Electronic Signature(s) Signed: 05/01/2023 2:50:20 PM By: Duanne Guess MD FACS Entered By: Duanne Guess on 05/01/2023 14:50:20 -------------------------------------------------------------------------------- Multi-Disciplinary Care Plan Details Patient Name: Date of Service: [name], [name] 05/01/2023 2:00 PM Medical Record Number: [id number] Patient Account Number: [id number] Date of Birth/Sex: Treating RN: December 14, 1945 (77 y.o. [name] Primary Care Warda Mcqueary: Eustaquio Boyden Other Clinician: Referring Samar Venneman: Treating Majestic Molony/Extender: [name] in Treatment: 39 Multidisciplinary Care Plan reviewed with physician Active Inactive Venous Leg Ulcer Nursing Diagnoses: Knowledge deficit related to disease process and management Potential for venous Insuffiency (use before diagnosis confirmed) Goals: Patient will maintain optimal edema control Date Initiated: 02/07/2023 Target Resolution Date: 07/05/2023 Goal Status: Active Interventions: Assess peripheral edema status every visit. Compression as ordered Treatment Activities: Therapeutic compression applied : 02/07/2023 Notes: Wound/Skin Impairment Nursing Diagnoses: Impaired tissue integrity Goals: Ulcer/skin breakdown will have a volume reduction of 30% by week 4 Date Initiated: 08/03/2022 Date Inactivated: 09/10/2022 Target Resolution Date: 08/31/2022 Unmet Reason: uncontrolled edema, Goal Status: Unmet acute infection Ulcer/skin breakdown will have a volume reduction of 50% by week 8 Date Initiated: 09/10/2022 Target Resolution Date: 07/05/2023 Goal Status: Active Interventions: Assess ulceration(s) every visit Treatment Activities: Skin care regimen initiated : 08/03/2022 Notes: Keystone ordered. 10/30 RX Levo started. 09/10/22 Electronic Signature(s) Signed: 05/02/2023 12:38:14 PM By: Brenton Grills Entered By: Brenton Grills on 05/01/2023 14:23:04 [name] ([id number]) [id number] Page 5 of 8 -------------------------------------------------------------------------------- Pain Assessment Details Patient Name: Date of Service: [name], [name]. 05/01/2023 2:00 PM Medical  Record Number: [id number] Patient Account Number: [id number] Date of Birth/Sex: Treating RN: 05-Jul-1946 (77 y.o. [name] Primary Care Armanii Urbanik: Eustaquio Boyden Other Clinician: Referring Ireoluwa Grant: Treating Margit Batte/Extender: [name] in Treatment: 39 Active Problems Location of Pain Severity and Description of  Pain Patient Has Paino No Site Locations Pain Management and Medication Current Pain Management: Electronic Signature(s) Signed: 05/02/2023 12:38:14 PM By: Brenton Grills Entered By: Brenton Grills on 05/01/2023 14:11:45 -------------------------------------------------------------------------------- Patient/Caregiver Education Details Patient Name: Date of Service: Allred, Javona J. 6/26/2024andnbsp2:00 PM Medical Record Number: [id number] Patient Account Number: [id number] Date of Birth/Gender: Treating RN: 1946/01/04 (77 y.o. [name] Primary Care Physician: Eustaquio Boyden Other Clinician: Referring Physician: Treating Physician/Extender: [name] in Treatment: 24 Education Assessment Education Provided To: Patient Education Topics Provided Wound/Skin Impairment: Methods: Explain/Verbal [name], [name] ([id number]) [id number] Page 6 of 8 Responses: State content correctly Electronic Signature(s) Signed: 05/02/2023 12:38:14 PM By: Brenton Grills Entered By: Brenton Grills on 05/01/2023 14:23:29 -------------------------------------------------------------------------------- Wound Assessment Details Patient Name: Date of Service: [name], [name] 05/01/2023 2:00 PM Medical Record Number: [id number] Patient Account Number: [id number] Date of Birth/Sex: Treating RN: 31-Aug-1946 (77 y.o. [name] Primary Care Daisean Brodhead: Eustaquio Boyden Other Clinician: Referring [name] [name]: Treating Kaleiyah Polsky/Extender: Katharina Caper Weeks in Treatment: 39 Wound Status Wound Number: 2 Primary Venous Leg Ulcer Etiology: Wound Location: Left, Posterior Lower Leg Wound Open Wounding Event: Blister Status: Date Acquired: 07/06/2022 Comorbid Cataracts, Asthma, Hypertension, Neuropathy, Received Weeks Of Treatment: 39 History: Chemotherapy, Received Radiation Clustered Wound: No Photos Wound  Measurements Length: (cm) 4 Width: (cm) 2.3 Depth: (cm) 0.2 Area: (cm) 7.226 Volume: (cm) 1.445 % Reduction in Area: -5.2% % Reduction in Volume: -110.3% Epithelialization: Small (1-33%) Tunneling: No Undermining: No Wound Description Classification: Full Thickness Without Exposed Support Structures Wound Margin: Distinct, outline attached Exudate Amount: Medium Exudate Type: Serosanguineous Exudate Color: red, brown Foul Odor After Cleansing: No Slough/Fibrino Yes Wound Bed Granulation Amount: Medium (34-66%) Exposed Structure Granulation Quality: Red Fascia Exposed: No Necrotic Amount: Medium (34-66%) Fat Layer (Subcutaneous Tissue) Exposed: Yes Necrotic Quality: Eschar, Adherent Slough Tendon Exposed: No Muscle Exposed: No Joint Exposed: No Bone Exposed: No Periwound Skin Texture Texture Color No Abnormalities Noted: Yes No Abnormalities Noted: No Rubor: [address] [place], [place] ([id number]) [id number] Page 7 of 8 Rubor: Yes Moisture No Abnormalities Noted: No Temperature / Pain Dry / Scaly: No Temperature: No Abnormality Maceration: Yes Tenderness on Palpation: Yes Treatment Notes Wound #2 (Lower Leg) Wound Laterality: Left, Posterior Cleanser Soap and Water Discharge Instruction: May shower and wash wound with dial antibacterial soap and water prior to dressing change. Peri-Wound Care Sween Lotion (Moisturizing lotion) Discharge Instruction: Apply moisturizing lotion as directed Topical Gentamicin Discharge Instruction: As directed by physician Mupirocin Ointment Discharge Instruction: Apply Mupirocin (Bactroban) as instructed Skintegrity Hydrogel 4 (oz) Discharge Instruction: Apply hydrogel as directed Primary Dressing Hydrofera Blue Classic Foam, 4x4 in Discharge Instruction: Moisten with saline prior to applying to wound bed Secondary Dressing Zetuvit Plus 4x8 in Discharge Instruction: Apply over primary dressing as  directed. Secured With [employer] T 2x10 (in/yd) ape Discharge Instruction: Secure with tape as directed. Compression Wrap Unnaboot w/Calamine, 4x10 (in/yd) Discharge Instruction: Apply Unnaboot as directed. Compression Stockings Add-Ons Electronic Signature(s) Signed: 05/02/2023 12:38:14 PM By: Brenton Grills Entered By: Brenton Grills on 05/01/2023 14:22:17 -------------------------------------------------------------------------------- Vitals Details Patient Name: Date of Service: [name]. 05/01/2023 2:00 PM Medical Record Number: [id number] Patient Account Number: [id number] Date of  Birth/Sex: Treating RN: 1946/04/16 (77 y.o. [name] Primary Care Emir Nack: Eustaquio Boyden Other Clinician: Referring Jacolby Risby: Treating Samual Beals/Extender: [name] in Treatment: 39 Vital Signs Time Taken: 14:07 Temperature (F): 98.2 Height (in): 63 Pulse (bpm): 65 Weight (lbs): 200 Respiratory Rate (breaths/min): 18 Body Mass Index (BMI): 35.4 Blood Pressure (mmHg): 159/105 Reference Range: 80 - 120 mg / dl CYNTHIA, COGLE ([id number]) [id number] Page 8 of 8 Electronic Signature(s) Signed: 05/02/2023 12:38:14 PM By: Brenton Grills Entered By: Brenton Grills on 05/01/2023 14:11:31

## 2023-05-06 NOTE — Progress Notes (Signed)
____________________________________________________________  Attending physician addendum:  Thank you for sending this case to me. I have reviewed the entire note and agree with the plan.  If infectious studies negative and no improvement with a cautious trial of low-dose dicyclomine (potential side effects in older patient- please check to be sure she does not have glaucoma before starting antispasmodic agent), then a colonoscopy would be warranted.  Amada Jupiter, MD  ____________________________________________________________

## 2023-05-08 ENCOUNTER — Encounter (HOSPITAL_BASED_OUTPATIENT_CLINIC_OR_DEPARTMENT_OTHER): Payer: Medicare Other | Attending: General Surgery | Admitting: General Surgery

## 2023-05-08 DIAGNOSIS — L97222 Non-pressure chronic ulcer of left calf with fat layer exposed: Secondary | ICD-10-CM | POA: Diagnosis not present

## 2023-05-08 DIAGNOSIS — G4733 Obstructive sleep apnea (adult) (pediatric): Secondary | ICD-10-CM | POA: Diagnosis not present

## 2023-05-08 DIAGNOSIS — Z86718 Personal history of other venous thrombosis and embolism: Secondary | ICD-10-CM | POA: Diagnosis not present

## 2023-05-08 DIAGNOSIS — L97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d: Secondary | ICD-10-CM | POA: Insufficient documentation

## 2023-05-08 DIAGNOSIS — G629 Polyneuropathy, unspecified: Secondary | ICD-10-CM | POA: Insufficient documentation

## 2023-05-08 DIAGNOSIS — I871 Compression of vein: Secondary | ICD-10-CM | POA: Insufficient documentation

## 2023-05-08 DIAGNOSIS — J45909 Unspecified asthma, uncomplicated: Secondary | ICD-10-CM | POA: Diagnosis not present

## 2023-05-08 DIAGNOSIS — I10 Essential (primary) hypertension: Secondary | ICD-10-CM | POA: Diagnosis not present

## 2023-05-08 DIAGNOSIS — I872 Venous insufficiency (chronic) (peripheral): Secondary | ICD-10-CM | POA: Diagnosis not present

## 2023-05-15 ENCOUNTER — Encounter (HOSPITAL_BASED_OUTPATIENT_CLINIC_OR_DEPARTMENT_OTHER): Payer: Medicare Other | Admitting: General Surgery

## 2023-05-15 DIAGNOSIS — G629 Polyneuropathy, unspecified: Secondary | ICD-10-CM | POA: Diagnosis not present

## 2023-05-15 DIAGNOSIS — I871 Compression of vein: Secondary | ICD-10-CM | POA: Diagnosis not present

## 2023-05-15 DIAGNOSIS — L97222 Non-pressure chronic ulcer of left calf with fat layer exposed: Secondary | ICD-10-CM | POA: Diagnosis not present

## 2023-05-15 DIAGNOSIS — I872 Venous insufficiency (chronic) (peripheral): Secondary | ICD-10-CM | POA: Diagnosis not present

## 2023-05-15 DIAGNOSIS — I10 Essential (primary) hypertension: Secondary | ICD-10-CM | POA: Diagnosis not present

## 2023-05-15 DIAGNOSIS — G4733 Obstructive sleep apnea (adult) (pediatric): Secondary | ICD-10-CM | POA: Diagnosis not present

## 2023-05-15 DIAGNOSIS — J45909 Unspecified asthma, uncomplicated: Secondary | ICD-10-CM | POA: Diagnosis not present

## 2023-05-15 DIAGNOSIS — L97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d: Secondary | ICD-10-CM | POA: Diagnosis not present

## 2023-05-15 NOTE — Progress Notes (Signed)
LENZI, MARMO ([id number]) [id number] Page 1 of 8 Visit Report for 05/15/2023 Arrival Information Details Patient Name: Date of Service: SHARMILA, WROBLESKI 05/15/2023 2:30 PM Medical Record Number: [id number] Patient Account Number: [id number] Date of Birth/Sex: Treating RN: [birth year] (77 y.o. [name] Primary Care Sarra Rachels: Eustaquio Boyden Other Clinician: Referring Devyn Sheerin: Treating Nizhoni Parlow/Extender: Priscille Loveless in Treatment: 61 Visit Information History Since Last Visit All ordered tests and consults were completed: Yes Patient Arrived: Ambulatory Added or deleted any medications: No Arrival Time: 14:46 Any new allergies or adverse reactions: No Accompanied By: self Had a fall or experienced change in No Transfer Assistance: None activities of daily living that may affect Patient Identification Verified: Yes risk of falls: Secondary Verification Process Completed: Yes Signs or symptoms of abuse/neglect since last visito No Patient Requires Transmission-Based Precautions: No Hospitalized since last visit: No Patient Has Alerts: No Implantable device outside of the clinic excluding No cellular tissue based products placed in the center since last visit: Has Dressing in Place as Prescribed: Yes Pain Present Now: No Electronic Signature(s) Signed: 05/15/2023 4:28:57 PM By: Brenton Grills Entered By: Brenton Grills on 05/15/2023 14:59:31 -------------------------------------------------------------------------------- Compression Therapy Details Patient Name: Date of Service: JONNELLE, LAWNICZAK 05/15/2023 2:30 PM Medical Record Number: [id number] Patient Account Number: [id number] Date of Birth/Sex: Treating RN: [birth year] (77 y.o. [name] Primary Care Ulyana Pitones: Eustaquio Boyden Other Clinician: Referring Kameisha Malicki: Treating Gearline Spilman/Extender: Priscille Loveless in Treatment:  08 Compression Therapy Performed for Wound Assessment: Wound #2 Left,Posterior Lower Leg Performed By: Clinician Brenton Grills, RN Compression Type: Three Layer Post Procedure Diagnosis Same as Pre-procedure Electronic Signature(s) Signed: 05/15/2023 4:28:57 PM By: Brenton Grills Entered By: Brenton Grills on 05/15/2023 16:19:26 [name] ([id number]) [id number] Page 2 of 8 -------------------------------------------------------------------------------- Encounter Discharge Information Details Patient Name: Date of Service: RIYAH, BARDON 05/15/2023 2:30 PM Medical Record Number: [id number] Patient Account Number: [id number] Date of Birth/Sex: Treating RN: 08/12/1946 (77 y.o. [name] Primary Care Costas Sena: Eustaquio Boyden Other Clinician: Referring Buryl Bamber: Treating Leilene Diprima/Extender: Priscille Loveless in Treatment: 7 Encounter Discharge Information Items Post Procedure Vitals Discharge Condition: Stable Temperature (F): 98.1 Ambulatory Status: Ambulatory Pulse (bpm): 72 Discharge Destination: Home Respiratory Rate (breaths/min): 18 Transportation: Private Auto Blood Pressure (mmHg): 128/64 Accompanied By: self Schedule Follow-up Appointment: Yes Clinical Summary of Care: Patient Declined Electronic Signature(s) Signed: 05/15/2023 4:28:57 PM By: Brenton Grills Entered By: Brenton Grills on 05/15/2023 15:34:27 -------------------------------------------------------------------------------- Lower Extremity Assessment Details Patient Name: Date of Service: ANDRIANNA, MANALANG 05/15/2023 2:30 PM Medical Record Number: [id number] Patient Account Number: [id number] Date of Birth/Sex: Treating RN: July 15, 1946 (77 y.o. [name] Primary Care Rolan Wrightsman: Eustaquio Boyden Other Clinician: Referring Alantis Bethune: Treating Abbygale Lapid/Extender: Priscille Loveless in Treatment: 41 Edema  Assessment Assessed: [name]: No] Franne Forts: No] Edema: [Left: Ye] [Right: s] Calf Left: Right: Point of Measurement: 31 cm From Medial Instep 45.4 cm Ankle Left: Right: Point of Measurement: 9 cm From Medial Instep 24.8 cm Vascular Assessment Pulses: Dorsalis Pedis Palpable: [Left:Yes] Extremity colors, hair growth, and conditions: Extremity Color: [Left:Normal] Hair Growth on Extremity: [Left:Yes] Temperature of Extremity: [Left:Warm] Capillary Refill: [Left:< 3 seconds] Dependent Rubor: [Left:No] Blanched when Elevated: [Left:No No] Toe Nail Assessment Left: Right: Thick: Yes Discolored: Yes Deformed: Yes Improper Length and Hygiene: LAMIRACLE, CHAIDEZ ([id number]) [id number] Page 3 of 8 Electronic Signature(s) Signed: 05/15/2023 4:28:57 PM By: Brenton Grills Entered By: Brenton Grills on 05/15/2023 15:01:23 -------------------------------------------------------------------------------- Multi Wound Chart Details Patient  Name: Date of Service: KASSIDY, DOCKENDORF 05/15/2023 2:30 PM Medical Record Number: [id number] Patient Account Number: [id number] Date of Birth/Sex: Treating RN: 1946/02/21 (77 y.o. F) Primary Care Johnice Riebe: Eustaquio Boyden Other Clinician: Referring Lynnda Wiersma: Treating Genoveva Singleton/Extender: Priscille Loveless in Treatment: 41 Vital Signs Height(in): 63 Pulse(bpm): 70 Weight(lbs): 200 Blood Pressure(mmHg): 130/65 Body Mass Index(BMI): 35.4 Temperature(F): 98.8 Respiratory Rate(breaths/min): 18 [2:Photos:] [N/A:N/A] Left, Posterior Lower Leg N/A N/A Wound Location: Blister N/A N/A Wounding Event: Venous Leg Ulcer N/A N/A Primary Etiology: Cataracts, Asthma, Hypertension, N/A N/A Comorbid History: Neuropathy, Received Chemotherapy, Received Radiation 07/06/2022 N/A N/A Date Acquired: 3 N/A N/A Weeks of Treatment: Open N/A N/A Wound Status: No N/A N/A Wound Recurrence: 3.9x2.3x0.2 N/A  N/A Measurements L x W x D (cm) 7.045 N/A N/A A (cm) : rea 1.409 N/A N/A Volume (cm) : -2.50% N/A N/A % Reduction in A rea: -105.10% N/A N/A % Reduction in Volume: Full Thickness Without Exposed N/A N/A Classification: Support Structures Medium N/A N/A Exudate A mount: Serosanguineous N/A N/A Exudate Type: red, brown N/A N/A Exudate Color: Distinct, outline attached N/A N/A Wound Margin: Medium (34-66%) N/A N/A Granulation A mount: Red N/A N/A Granulation Quality: Medium (34-66%) N/A N/A Necrotic A mount: Eschar, Adherent Slough N/A N/A Necrotic Tissue: Fat Layer (Subcutaneous Tissue): Yes N/A N/A Exposed Structures: Fascia: No Tendon: No Muscle: No Joint: No Bone: No Small (1-33%) N/A N/A Epithelialization: Debridement - Selective/Open Wound N/A N/A Debridement: Pre-procedure Verification/Time Out 15:15 N/A N/A Taken: Slough N/A N/A Tissue Debrided: Non-Viable Tissue N/A N/A Level: 7.04 N/A N/A Debridement A (sq cm): rea ELLAINA, SCHULER ([id number]) [id number] Page 4 of 8 Curette N/A N/A Instrument: Minimum N/A N/A Bleeding: Pressure N/A N/A Hemostasis Achieved: 0 N/A N/A Procedural Pain: 0 N/A N/A Post Procedural Pain: Procedure was tolerated well N/A N/A Debridement Treatment Response: 3.9x2.3x0.2 N/A N/A Post Debridement Measurements L x W x D (cm) 1.409 N/A N/A Post Debridement Volume: (cm) Scarring: Yes N/A N/A Periwound Skin Texture: Maceration: Yes N/A N/A Periwound Skin Moisture: Dry/Scaly: No Rubor: Yes N/A N/A Periwound Skin Color: No Abnormality N/A N/A Temperature: Yes N/A N/A Tenderness on Palpation: Debridement N/A N/A Procedures Performed: Treatment Notes Electronic Signature(s) Signed: 05/15/2023 3:17:39 PM By: Duanne Guess MD FACS Entered By: Duanne Guess on 05/15/2023 15:17:39 -------------------------------------------------------------------------------- Multi-Disciplinary Care  Plan Details Patient Name: Date of Service: MARKEA, RUZICH 05/15/2023 2:30 PM Medical Record Number: [id number] Patient Account Number: [id number] Date of Birth/Sex: Treating RN: September 30, 1946 (77 y.o. [name] Primary Care Monserratt Knezevic: Eustaquio Boyden Other Clinician: Referring Akayla Brass: Treating Joei Frangos/Extender: Priscille Loveless in Treatment: 12 Multidisciplinary Care Plan reviewed with physician Active Inactive Venous Leg Ulcer Nursing Diagnoses: Knowledge deficit related to disease process and management Potential for venous Insuffiency (use before diagnosis confirmed) Goals: Patient will maintain optimal edema control Date Initiated: 02/07/2023 Target Resolution Date: 07/05/2023 Goal Status: Active Interventions: Assess peripheral edema status every visit. Compression as ordered Treatment Activities: Therapeutic compression applied : 02/07/2023 Notes: Wound/Skin Impairment Nursing Diagnoses: Impaired tissue integrity Goals: Ulcer/skin breakdown will have a volume reduction of 30% by week 4 Date Initiated: 08/03/2022 Date Inactivated: 09/10/2022 Target Resolution Date: 08/31/2022 Unmet Reason: uncontrolled edema, Goal Status: Unmet acute infection Ulcer/skin breakdown will have a volume reduction of 50% by week 8 Date Initiated: 09/10/2022 Target Resolution Date: 07/05/2023 FAHMIDA, JURICH ([id number]) [id number] Page 5 of 8 Goal Status: Active Interventions: Assess ulceration(s) every visit Treatment Activities: Skin care regimen initiated : 08/03/2022 Notes: [place]stone ordered.  10/30 RX Levo started. 09/10/22 Electronic Signature(s) Signed: 05/15/2023 4:28:57 PM By: Brenton Grills Entered By: Brenton Grills on 05/15/2023 15:08:31 -------------------------------------------------------------------------------- Pain Assessment Details Patient Name: Date of Service: SHERMAINE, RIVET 05/15/2023 2:30 PM Medical Record  Number: [id number] Patient Account Number: [id number] Date of Birth/Sex: Treating RN: 08/26/1946 (77 y.o. [name] Primary Care Jayleah Garbers: Eustaquio Boyden Other Clinician: Referring Mallary Kreger: Treating Anay Walter/Extender: Priscille Loveless in Treatment: 74 Active Problems Location of Pain Severity and Description of Pain Patient Has Paino No Site Locations Pain Management and Medication Current Pain Management: Electronic Signature(s) Signed: 05/15/2023 4:28:57 PM By: Brenton Grills Entered By: Brenton Grills on 05/15/2023 15:00:22 -------------------------------------------------------------------------------- Patient/Caregiver Education Details Patient Name: Date of Service: [name] 7/10/2024andnbsp2:30 PM Medical Record Number: [id number] Patient Account Number: [id number] CAMILLA, SKEEN ([id number]) [id number] Page 6 of 8 Date of Birth/Gender: Treating RN: 1946/03/05 (77 y.o. [name] Primary Care Physician: Eustaquio Boyden Other Clinician: Referring Physician: Treating Physician/Extender: Priscille Loveless in Treatment: 53 Education Assessment Education Provided To: Patient Education Topics Provided Wound/Skin Impairment: Methods: Explain/Verbal Responses: State content correctly Electronic Signature(s) Signed: 05/15/2023 4:28:57 PM By: Brenton Grills Entered By: Brenton Grills on 05/15/2023 15:08:48 -------------------------------------------------------------------------------- Wound Assessment Details Patient Name: Date of Service: TRINTY, MARKEN 05/15/2023 2:30 PM Medical Record Number: [id number] Patient Account Number: [id number] Date of Birth/Sex: Treating RN: [birth year] (77 y.o. [name] Primary Care Abdifatah Colquhoun: Eustaquio Boyden Other Clinician: Referring Esmeralda Blanford: Treating Vila Dory/Extender: Katharina Caper Weeks in Treatment: 41 Wound  Status Wound Number: 2 Primary Venous Leg Ulcer Etiology: Wound Location: Left, Posterior Lower Leg Wound Open Wounding Event: Blister Status: Date Acquired: 07/06/2022 Comorbid Cataracts, Asthma, Hypertension, Neuropathy, Received Weeks Of Treatment: 41 History: Chemotherapy, Received Radiation Clustered Wound: No Photos Wound Measurements Length: (cm) 3.9 Width: (cm) 2.3 Depth: (cm) 0.2 Area: (cm) 7.045 Volume: (cm) 1.409 % Reduction in Area: -2.5% % Reduction in Volume: -105.1% Epithelialization: Small (1-33%) Tunneling: No Undermining: No Wound Description Classification: Full Thickness Without Exposed Support Structures Wound Margin: Distinct, outline attached Exudate Amount: Medium Exudate Type: Serosanguineous Exudate Color: red, brown GUDRUN, AXE ([id number]) Foul Odor After Cleansing: No Slough/Fibrino Yes [id number] Page 7 of 8 Wound Bed Granulation Amount: Medium (34-66%) Exposed Structure Granulation Quality: Red Fascia Exposed: No Necrotic Amount: Medium (34-66%) Fat Layer (Subcutaneous Tissue) Exposed: Yes Necrotic Quality: Eschar, Adherent Slough Tendon Exposed: No Muscle Exposed: No Joint Exposed: No Bone Exposed: No Periwound Skin Texture Texture Color No Abnormalities Noted: Yes No Abnormalities Noted: No Rubor: Yes Moisture No Abnormalities Noted: No Temperature / Pain Dry / Scaly: No Temperature: No Abnormality Maceration: Yes Tenderness on Palpation: Yes Treatment Notes Wound #2 (Lower Leg) Wound Laterality: Left, Posterior Cleanser Soap and Water Discharge Instruction: May shower and wash wound with dial antibacterial soap and water prior to dressing change. Peri-Wound Care Sween Lotion (Moisturizing lotion) Discharge Instruction: Apply moisturizing lotion as directed Topical Gentamicin Discharge Instruction: As directed by physician Mupirocin Ointment Discharge Instruction: Apply Mupirocin  (Bactroban) as instructed Skintegrity Hydrogel 4 (oz) Discharge Instruction: Apply hydrogel as directed Primary Dressing Endoform 2x2 in Discharge Instruction: Moisten with saline Secondary Dressing Zetuvit Plus 4x8 in Discharge Instruction: Apply over primary dressing as directed. Secured With [employer] T 2x10 (in/yd) ape Discharge Instruction: Secure with tape as directed. Compression Wrap Unnaboot w/Calamine, 4x10 (in/yd) Discharge Instruction: Apply Unnaboot as directed. Compression Stockings Add-Ons Electronic Signature(s) Signed: 05/15/2023 4:28:57 PM By: Brenton Grills Entered By: Brenton Grills on 05/15/2023  15:06:01 -------------------------------------------------------------------------------- Vitals Details Patient Name: Date of Service: EMI, LYMON 05/15/2023 2:30 PM STEPHANIEANN, POPESCU ([id number]) [id number] Page 8 of 8 Medical Record Number: [id number] Patient Account Number: [id number] Date of Birth/Sex: Treating RN: [birth year] (77 y.o. [name] Primary Care Mykale Gandolfo: Eustaquio Boyden Other Clinician: Referring Alexsis Branscom: Treating Ricky Doan/Extender: Priscille Loveless in Treatment: 41 Vital Signs Time Taken: 14:45 Temperature (F): 98.8 Height (in): 63 Pulse (bpm): 70 Weight (lbs): 200 Respiratory Rate (breaths/min): 18 Body Mass Index (BMI): 35.4 Blood Pressure (mmHg): 130/65 Reference Range: 80 - 120 mg / dl Electronic Signature(s) Signed: 05/15/2023 4:28:57 PM By: Brenton Grills Entered By: Brenton Grills on 05/15/2023 15:00:13

## 2023-05-15 NOTE — Progress Notes (Signed)
NIKOLA, BLACKSTON ([id number]) [id number] Page 1 of 11 Visit Report for 05/15/2023 Chief Complaint Document Details Patient Name: Date of Service: [name], [name] 05/15/2023 2:30 PM Medical Record Number: [id number] Patient Account Number: [id number] Date of Birth/Sex: Treating RN: 1946-10-11 (77 y.o. F) Primary Care Provider: Eustaquio Boyden Other Clinician: Referring Provider: Treating Provider/Extender: Priscille Loveless in Treatment: 77 Information Obtained from: Patient Chief Complaint Patient presents for treatment of an open ulcer due to venous insufficiency Electronic Signature(s) Signed: 05/15/2023 3:18:12 PM By: Duanne Guess MD FACS Entered By: Duanne Guess on 05/15/2023 15:18:12 -------------------------------------------------------------------------------- Debridement Details Patient Name: Date of Service: [name], [name] 05/15/2023 2:30 PM Medical Record Number: [id number] Patient Account Number: [id number] Date of Birth/Sex: Treating RN: [birth year] (77 y.o. F) Primary Care Provider: Eustaquio Boyden Other Clinician: Referring Provider: Treating Provider/Extender: Priscille Loveless in Treatment: 41 Debridement Performed for Assessment: Wound #2 Left,Posterior Lower Leg Performed By: Physician Duanne Guess, MD Debridement Type: Debridement Severity of Tissue Pre Debridement: Fat layer exposed Level of Consciousness (Pre-procedure): Awake and Alert Pre-procedure Verification/Time Out Yes - 15:15 Taken: Start Time: 15:16 Percent of Wound Bed Debrided: 100% T Area Debrided (cm): otal 7.04 Tissue and other material debrided: Non-Viable, Slough, Slough Level: Non-Viable Tissue Debridement Description: Selective/Open Wound Instrument: Curette Bleeding: Minimum Hemostasis Achieved: Pressure End Time: 15:18 Procedural Pain: 0 Post Procedural Pain: 0 Response to Treatment: Procedure  was tolerated well Level of Consciousness (Post- Awake and Alert procedure): Post Debridement Measurements of Total Wound Length: (cm) 3.9 Width: (cm) 2.3 Depth: (cm) 0.2 Volume: (cm) 1.409 Character of Wound/Ulcer Post Debridement: Stable Severity of Tissue Post Debridement: Fat layer exposed [name], [name] ([id number]) [id number] Page 2 of 11 Post Procedure Diagnosis Same as Pre-procedure Notes scribed for Dr. Lady Gary by Brenton Grills RN. Electronic Signature(s) Signed: 05/15/2023 3:17:54 PM By: Duanne Guess MD FACS Entered By: Duanne Guess on 05/15/2023 15:17:54 -------------------------------------------------------------------------------- HPI Details Patient Name: Date of Service: [name], [name] 05/15/2023 2:30 PM Medical Record Number: [id number] Patient Account Number: [id number] Date of Birth/Sex: Treating RN: 1945-12-25 (77 y.o. F) Primary Care Provider: Eustaquio Boyden Other Clinician: Referring Provider: Treating Provider/Extender: Priscille Loveless in Treatment: 66 History of Present Illness HPI Description: ADMISSION This is a 77 year old woman with a history of chronic venous insufficiency status post saphenous vein ablations in 2010 and 2016. 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 She presents today with an open venous ulcer on her left lower leg. It has been present for a little over 2 weeks. She saw her primary care provider who apparently swabbed the wound and grew out Pseudomonas. She just completed a course of ciprofloxacin for this. ABI was 0.91. She reports that she has had previous issues with ulcers in this same location, stemming back to a punch biopsy taken by a dermatologist many years ago. She has had several skin substitutes applied to the area that have ultimately resulted in healing on prior occasions. She has 2 small ulcers on her left medial lower leg. There  is slough accumulation in both of them. The more anterior of the 2 is quite small and has some soft slough on the surface, underneath which there is good granulation tissue. The more medial wound also has slough accumulation, but the underlying surface is fairly fibrotic and gritty. This is consistent with her provided history of multiple ulcers in the same location. 08/03/2022: The anterior wound is smaller today with just a little bit of  slough accumulation. The more medial wound continues to be very sensitive and fibrotic with slough buildup. 08/13/2022: The anterior wound is closed. The more medial wound remains sensitive with a fairly fibrotic surface. There is more granulation tissue filling in, however, and there is less slough than on prior occasions. 08/20/2022: The anterior wound remains closed. The more medial wound has filled with granulation tissue but still has a fair amount of slough on the surface and remains fairly tender. Unfortunately, she has developed 2 small ulcers just proximal to this. The fat layer is exposed in each. She says that over the weekend, she felt a burning sensation in that location. 08/27/2022: The 2 small wounds proximal to the main wound have merged into a single site. The wounds look a little bit dry, but they are quite clean without significant slough accumulation. They remain quite tender. 09/03/2022: All of the wounds have now merged into 1 large wound. There is a strong odor coming from the wound and the surface is not particularly viable. It is extremely painful for her today. 09/10/2022: The wound is less black and purple this week but still does not look particularly viable. The surface is desiccated. The culture that I took last week returned with a polymicrobial population including Pseudomonas. I prescribed both Augmentin and levofloxacin. She has not yet picked up levofloxacin, as her pharmacy only notified her yesterday that it was ready. We have  ordered a Keystone topical compounded antibiotic, but it has not yet come. 09/17/2022: The wound surface is markedly improved today. There is still an area of grayish-looking muscle but the rest appears significantly more viable. There is still a layer of slough on the surface. She still has a couple days left of oral antibiotic therapy. She has her Jodie Echevaria compounded topical antibiotic with her today. 09/24/2022: She has completed her oral antibiotic therapy. The wound surface is much cleaner today and more viable without any necrotic tissue. It is a bit desiccated, however. 10/01/2022: The moisture balance of the wound has improved. There is a layer of yellow slough on the surface, but beneath this, the wound is more pink. 10/08/2022: The wound is smaller and cleaner today with a layer of slough present. She is having less pain. 12/18; this patient has a new wound on the right lateral ankle which apparently started after a cat scratched her. Secondarily she managed to drop a cake pan on the same area. This is very painful. The original wound was on the left posterior calf we have been using [employer] under an [employer]. The Unna boot fell down and apparently she was in for a nurse visit perhaps last week and that 1 fell down as well This patient has central venous mediated hypertension. For member correctly she has a stent placed in the left common iliac artery by Dr. Randie Heinz some years ago [facility] Thurner syndrome] she also has had venous ablations and I believe a history of a DVT The patient has compression pumps at home but she does not use them YACINE, DROZ ([id number]) [id number] Page 3 of 11 10/30/2022: The patient says that her wounds burned all week with the [facility] classic in place. She has a fair amount of slough and nonviable subcutaneous tissue present on the right ankle. The left posterior calf wound is cleaner than I have seen it to date. Both  remain fairly tender. 11/14/2022: Both wounds have substantial slough and eschar accumulation. They remain extremely tender. 11/21/2022: The wounds are cleaner  this week, but still have slough and eschar accumulation. She continues to describe burning pain in both wounds, but upon further questioning, she actually has burning in both of her feet that radiates up her legs and includes to the wounds. 11/28/2022: Both wounds have slough and eschar accumulation, as well as adherent silver alginate that was difficult to remove. She continues to have pain out of proportion to the extent of her wounds. Her PCP did initiate Lyrica which she started taking last night. The dose is quite low, only 25 mg, but apparently there is a plan for upward titration, assuming she tolerates the medication. 12/05/2022: The wounds are both a little bit smaller today, but have significant slough accumulation, as usual. They remain exquisitely tender. She is now taking Lyrica twice a day. 12/19/2022: Both of her wounds look significantly better this week. There is slough buildup on both, but they are smaller. She seems to be getting a good response from Lyrica as she is having much less pain. 12/26/2022: The wound on her right lateral ankle is nearly closed. The wound on her left posterior calf is smaller but still has a lot of slough accumulation. They remain tender. 01/02/2023: The right lateral ankle wound is down to just a couple of millimeters. It is much less tender than on prior occasions. There is minimal slough buildup. The wound on her posterior calf continues to contract, as well. It has thick slough buildup and remains fairly tender. 01/09/2023: The right lateral ankle wound is nearly closed. He remains tender. There is just a little eschar on the surface. The wound on her posterior calf has improved quite a bit. There is still slough on the surface, but the more proximal area has epithelialized substantially and there is  no longer any gray discoloration to her tissue. 3/13; patient presents for follow-up. Her right lateral ankle wound is almost healed. She has a wound to her left posterior calf with slough and granulation tissue. We have been using Santyl under Unna boots bilaterally to the lower extremities. She has no issues or complaints today. 01/30/2023: The right lateral ankle wound is down to just a pinhole under a layer of eschar. The left posterior calf wound is quite a bit improved since the last time I saw it. There is still a layer of slough on the surface but there has been more epithelialization. 02/07/2023: The right lateral ankle wound still has some eschar on the surface. The left posterior calf wound is about the same size, but with more areas of epithelialization. 02/20/2023: The right lateral ankle wound is healed. The left posterior calf wound is smaller and is essentially flush with the surrounding skin, but the surface is quite dry. Her leg wrap slipped and her calf is quite swollen above the level of the wound. 03/01/2023: The right lateral ankle wound reopened and she also suffered a fall that resulted in a small wound on her right anterior tibial surface. The left posterior calf wound has a much better moisture balance today. There is a layer of slough on the surface. The [employer] worked much better for her as far as compression this week. 03/06/2023: The right lateral ankle wound has a layer of eschar on the surface and is too painful for me to debride. The anterior tibial surface wound is more or less healed; it is a very superficial abrasion at this point. The left posterior calf wound has a layer of slough on the surface. Her edema control is  better. 03/13/2023: The right lateral ankle wound has a layer of eschar on the surface. Once this was debrided, it was found to be healed. The anterior tibial surface wound has a layer of stuck silver alginate on the surface. This was removed to reveal a  very superficial abrasion but not much change from last week. The left posterior calf wound is smaller and has its usual layer of accumulated slough. 03/25/2023: The anterior tibial surface wound has eschar and silver alginate stuck to it. Once this was removed, the wound was found to be healed. The left posterior calf wound is roughly the same size and has a little bit thicker layer of accumulated slough. 04/03/2023: She has had more drainage from the posterior calf wound over the last week and the layer of slough is thicker. She also says it is more tender. 04/10/2023: Continued increase in drainage from her wound. The wound remains tender with a layer of slough on the surface. It does not appear as viable this week. She also has erythema, swelling, and warmth in her entire left lower leg extending into the midfoot. 04/17/2023: The wound is about the same size this week. There is fibrotic adherent slough, but the drainage is not as profuse. She is currently taking levofloxacin. She is complaining of an increase in the burning sensation at her wound. 04/24/2023: The wound measured smaller this week and is much cleaner-looking. She completed her course of levofloxacin. Her PCP increased her dose of Lyrica and she reports that the burning has stopped. 05/01/2023: The wound is smaller again today and quite clean with only minimal slough on the surface. 05/08/2023: The wound is smaller again today. It is fairly clean, but the surface is a bit fibrotic. Moisture balance is better. Her leg is more swollen and erythematous today, however. 05/15/2023: The wound is about the same size today. The surface has very little slough accumulation, but remains fibrotic. Moisture balance is good. She is responding nicely to the oral Keflex she is taking and her leg is less erythematous and edematous. Electronic Signature(s) Signed: 05/15/2023 3:19:02 PM By: Duanne Guess MD FACS Entered By: Duanne Guess on 05/15/2023  15:19:02 -------------------------------------------------------------------------------- Physical Exam Details Patient Name: Date of Service: [name], [name] 05/15/2023 2:30 PM [name] ([id number]) [id number] Page 4 of 11 Medical Record Number: [id number] Patient Account Number: [id number] Date of Birth/Sex: Treating RN: [birth year] (77 y.o. F) Primary Care Provider: Eustaquio Boyden Other Clinician: Referring Provider: Treating Provider/Extender: Priscille Loveless in Treatment: 41 Constitutional . . . . no acute distress. Respiratory Normal work of breathing on room air. Notes 05/15/2023: The wound is about the same size today. The surface has very little slough accumulation, but remains fibrotic. Moisture balance is good. Her leg is less erythematous and edematous. Electronic Signature(s) Signed: 05/15/2023 3:19:44 PM By: Duanne Guess MD FACS Entered By: Duanne Guess on 05/15/2023 15:19:44 -------------------------------------------------------------------------------- Physician Orders Details Patient Name: Date of Service: [name], [name] 05/15/2023 2:30 PM Medical Record Number: [id number] Patient Account Number: [id number] Date of Birth/Sex: Treating RN: 1946/09/16 (77 y.o. [name] Primary Care Provider: Eustaquio Boyden Other Clinician: Referring Provider: Treating Provider/Extender: Priscille Loveless in Treatment: 84 Verbal / Phone Orders: No Diagnosis Coding Follow-up Appointments ppointment in 1 week. - Dr. Lady Gary Room 3 Return A Anesthetic Wound #2 Left,Posterior Lower Leg (In clinic) Topical Lidocaine 4% applied to wound bed Bathing/ Shower/ Hygiene May shower with protection but do not get wound dressing(s) wet. Protect  dressing(s) with water repellant cover (for example, large plastic bag) or a cast cover and may then take shower. Edema Control - Lymphedema / SCD /  Other Left Lower Extremity Lymphedema Pumps. Use Lymphedema pumps on leg(s) 2-3 times a day for 45-60 minutes. If wearing any wraps or hose, do not remove them. Continue exercising as instructed. - Use x1 per day Avoid standing for long periods of time. Exercise regularly Off-Loading Wound #2 Left,Posterior Lower Leg Other: - Elevate legs at heart level or above heart level while sitting. Wound Treatment Wound #2 - Lower Leg Wound Laterality: Left, Posterior Cleanser: Soap and Water 1 x Per Week/30 Days Discharge Instructions: May shower and wash wound with dial antibacterial soap and water prior to dressing change. Peri-Wound Care: Sween Lotion (Moisturizing lotion) 1 x Per Week/30 Days Discharge Instructions: Apply moisturizing lotion as directed Topical: Gentamicin 1 x Per Week/30 Days Discharge Instructions: As directed by physician Topical: Mupirocin Ointment 1 x Per Week/30 Days Discharge Instructions: Apply Mupirocin (Bactroban) as instructed AUTYMN, OMLOR ([id number]) [id number] Page 5 of 11 Topical: Skintegrity Hydrogel 4 (oz) 1 x Per Week/30 Days Discharge Instructions: Apply hydrogel as directed Prim Dressing: Endoform 2x2 in 1 x Per Week/30 Days ary Discharge Instructions: Moisten with saline Secondary Dressing: Zetuvit Plus 4x8 in 1 x Per Week/30 Days Discharge Instructions: Apply over primary dressing as directed. Secured With: 41M Medipore Scientist, research (life sciences) Surgical T 2x10 (in/yd) 1 x Per Week/30 Days ape Discharge Instructions: Secure with tape as directed. Compression Wrap: Unnaboot [name]/Calamine, 4x10 (in/yd) 1 x Per Week/30 Days Discharge Instructions: Apply Unnaboot as directed. Electronic Signature(s) Signed: 05/15/2023 4:07:16 PM By: Duanne Guess MD FACS Signed: 05/15/2023 4:28:57 PM By: Brenton Grills Entered By: Brenton Grills on 05/15/2023  15:20:31 -------------------------------------------------------------------------------- Problem List Details Patient Name: Date of Service: [name], [name] 05/15/2023 2:30 PM Medical Record Number: [id number] Patient Account Number: [id number] Date of Birth/Sex: Treating RN: 1946-02-20 (77 y.o. [name] Primary Care Provider: Eustaquio Boyden Other Clinician: Referring Provider: Treating Provider/Extender: Priscille Loveless in Treatment: [phone] Active Problems ICD-10 Encounter Code Description Active Date MDM Diagnosis [phone] Non-pressure chronic ulcer of other part of left lower leg with fat layer exposed 07/27/2022 No Yes I83.893 Varicose veins of bilateral lower extremities with other complications 07/27/2022 No Yes I87.2 Venous insufficiency (chronic) (peripheral) 07/27/2022 No Yes G62.9 Polyneuropathy, unspecified 07/27/2022 No Yes R60.0 Localized edema 07/27/2022 No Yes Inactive Problems ICD-10 Code Description Active Date Inactive Date L03.115 Cellulitis of right lower limb 10/22/2022 10/22/2022 L97.818 Non-pressure chronic ulcer of other part of right lower leg with other specified severity 03/01/2023 10/22/2022 [name], [name] ([id number]) [id number] Page 6 of 11 Resolved Problems Electronic Signature(s) Signed: 05/15/2023 3:16:59 PM By: Duanne Guess MD FACS Entered By: Duanne Guess on 05/15/2023 15:16:59 -------------------------------------------------------------------------------- Progress Note Details Patient Name: Date of Service: [name], [name] 05/15/2023 2:30 PM Medical Record Number: [id number] Patient Account Number: [id number] Date of Birth/Sex: Treating RN: 1946-06-08 (77 y.o. F) Primary Care Provider: Eustaquio Boyden Other Clinician: Referring Provider: Treating Provider/Extender: Priscille Loveless in Treatment: 73 Subjective Chief Complaint Information obtained from  Patient Patient presents for treatment of an open ulcer due to venous insufficiency History of Present Illness (HPI) ADMISSION This is a 77 year old woman with a history of chronic venous insufficiency status post saphenous vein ablations in 2010 and 2016. 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 She presents today with an open venous ulcer on her left lower leg.  It has been present for a little over 2 weeks. She saw her primary care provider who apparently swabbed the wound and grew out Pseudomonas. She just completed a course of ciprofloxacin for this. ABI was 0.91. She reports that she has had previous issues with ulcers in this same location, stemming back to a punch biopsy taken by a dermatologist many years ago. She has had several skin substitutes applied to the area that have ultimately resulted in healing on prior occasions. She has 2 small ulcers on her left medial lower leg. There is slough accumulation in both of them. The more anterior of the 2 is quite small and has some soft slough on the surface, underneath which there is good granulation tissue. The more medial wound also has slough accumulation, but the underlying surface is fairly fibrotic and gritty. This is consistent with her provided history of multiple ulcers in the same location. 08/03/2022: The anterior wound is smaller today with just a little bit of slough accumulation. The more medial wound continues to be very sensitive and fibrotic with slough buildup. 08/13/2022: The anterior wound is closed. The more medial wound remains sensitive with a fairly fibrotic surface. There is more granulation tissue filling in, however, and there is less slough than on prior occasions. 08/20/2022: The anterior wound remains closed. The more medial wound has filled with granulation tissue but still has a fair amount of slough on the surface and remains fairly tender. Unfortunately, she has developed 2 small ulcers  just proximal to this. The fat layer is exposed in each. She says that over the weekend, she felt a burning sensation in that location. 08/27/2022: The 2 small wounds proximal to the main wound have merged into a single site. The wounds look a little bit dry, but they are quite clean without significant slough accumulation. They remain quite tender. 09/03/2022: All of the wounds have now merged into 1 large wound. There is a strong odor coming from the wound and the surface is not particularly viable. It is extremely painful for her today. 09/10/2022: The wound is less black and purple this week but still does not look particularly viable. The surface is desiccated. The culture that I took last week returned with a polymicrobial population including Pseudomonas. I prescribed both Augmentin and levofloxacin. She has not yet picked up levofloxacin, as her pharmacy only notified her yesterday that it was ready. We have ordered a Keystone topical compounded antibiotic, but it has not yet come. 09/17/2022: The wound surface is markedly improved today. There is still an area of grayish-looking muscle but the rest appears significantly more viable. There is still a layer of slough on the surface. She still has a couple days left of oral antibiotic therapy. She has her Jodie Echevaria compounded topical antibiotic with her today. 09/24/2022: She has completed her oral antibiotic therapy. The wound surface is much cleaner today and more viable without any necrotic tissue. It is a bit desiccated, however. 10/01/2022: The moisture balance of the wound has improved. There is a layer of yellow slough on the surface, but beneath this, the wound is more pink. 10/08/2022: The wound is smaller and cleaner today with a layer of slough present. She is having less pain. 12/18; this patient has a new wound on the right lateral ankle which apparently started after a cat scratched her. Secondarily she managed to drop a cake pan on  the same area. This is very painful. The original wound was on the left posterior  calf we have been using [employer] under an [employer]. The Unna boot fell down and apparently she was in for a nurse visit perhaps last week and that 1 fell down as well This patient has central venous mediated hypertension. For member correctly she has a stent placed in the left common iliac artery by Dr. Randie Heinz some years ago [facility] Thurner syndrome] she also has had venous ablations and I believe a history of a DVT [name], [name] ([id number]) [id number] Page 7 of 11 The patient has compression pumps at home but she does not use them 10/30/2022: The patient says that her wounds burned all week with the [facility] classic in place. She has a fair amount of slough and nonviable subcutaneous tissue present on the right ankle. The left posterior calf wound is cleaner than I have seen it to date. Both remain fairly tender. 11/14/2022: Both wounds have substantial slough and eschar accumulation. They remain extremely tender. 11/21/2022: The wounds are cleaner this week, but still have slough and eschar accumulation. She continues to describe burning pain in both wounds, but upon further questioning, she actually has burning in both of her feet that radiates up her legs and includes to the wounds. 11/28/2022: Both wounds have slough and eschar accumulation, as well as adherent silver alginate that was difficult to remove. She continues to have pain out of proportion to the extent of her wounds. Her PCP did initiate Lyrica which she started taking last night. The dose is quite low, only 25 mg, but apparently there is a plan for upward titration, assuming she tolerates the medication. 12/05/2022: The wounds are both a little bit smaller today, but have significant slough accumulation, as usual. They remain exquisitely tender. She is now taking Lyrica twice a day. 12/19/2022: Both of her wounds  look significantly better this week. There is slough buildup on both, but they are smaller. She seems to be getting a good response from Lyrica as she is having much less pain. 12/26/2022: The wound on her right lateral ankle is nearly closed. The wound on her left posterior calf is smaller but still has a lot of slough accumulation. They remain tender. 01/02/2023: The right lateral ankle wound is down to just a couple of millimeters. It is much less tender than on prior occasions. There is minimal slough buildup. The wound on her posterior calf continues to contract, as well. It has thick slough buildup and remains fairly tender. 01/09/2023: The right lateral ankle wound is nearly closed. He remains tender. There is just a little eschar on the surface. The wound on her posterior calf has improved quite a bit. There is still slough on the surface, but the more proximal area has epithelialized substantially and there is no longer any gray discoloration to her tissue. 3/13; patient presents for follow-up. Her right lateral ankle wound is almost healed. She has a wound to her left posterior calf with slough and granulation tissue. We have been using Santyl under Unna boots bilaterally to the lower extremities. She has no issues or complaints today. 01/30/2023: The right lateral ankle wound is down to just a pinhole under a layer of eschar. The left posterior calf wound is quite a bit improved since the last time I saw it. There is still a layer of slough on the surface but there has been more epithelialization. 02/07/2023: The right lateral ankle wound still has some eschar on the surface. The left posterior calf wound is about the same  size, but with more areas of epithelialization. 02/20/2023: The right lateral ankle wound is healed. The left posterior calf wound is smaller and is essentially flush with the surrounding skin, but the surface is quite dry. Her leg wrap slipped and her calf is quite swollen  above the level of the wound. 03/01/2023: The right lateral ankle wound reopened and she also suffered a fall that resulted in a small wound on her right anterior tibial surface. The left posterior calf wound has a much better moisture balance today. There is a layer of slough on the surface. The [employer] worked much better for her as far as compression this week. 03/06/2023: The right lateral ankle wound has a layer of eschar on the surface and is too painful for me to debride. The anterior tibial surface wound is more or less healed; it is a very superficial abrasion at this point. The left posterior calf wound has a layer of slough on the surface. Her edema control is better. 03/13/2023: The right lateral ankle wound has a layer of eschar on the surface. Once this was debrided, it was found to be healed. The anterior tibial surface wound has a layer of stuck silver alginate on the surface. This was removed to reveal a very superficial abrasion but not much change from last week. The left posterior calf wound is smaller and has its usual layer of accumulated slough. 03/25/2023: The anterior tibial surface wound has eschar and silver alginate stuck to it. Once this was removed, the wound was found to be healed. The left posterior calf wound is roughly the same size and has a little bit thicker layer of accumulated slough. 04/03/2023: She has had more drainage from the posterior calf wound over the last week and the layer of slough is thicker. She also says it is more tender. 04/10/2023: Continued increase in drainage from her wound. The wound remains tender with a layer of slough on the surface. It does not appear as viable this week. She also has erythema, swelling, and warmth in her entire left lower leg extending into the midfoot. 04/17/2023: The wound is about the same size this week. There is fibrotic adherent slough, but the drainage is not as profuse. She is currently taking levofloxacin. She is  complaining of an increase in the burning sensation at her wound. 04/24/2023: The wound measured smaller this week and is much cleaner-looking. She completed her course of levofloxacin. Her PCP increased her dose of Lyrica and she reports that the burning has stopped. 05/01/2023: The wound is smaller again today and quite clean with only minimal slough on the surface. 05/08/2023: The wound is smaller again today. It is fairly clean, but the surface is a bit fibrotic. Moisture balance is better. Her leg is more swollen and erythematous today, however. 05/15/2023: The wound is about the same size today. The surface has very little slough accumulation, but remains fibrotic. Moisture balance is good. She is responding nicely to the oral Keflex she is taking and her leg is less erythematous and edematous. Patient History Information obtained from Patient. Family History Cancer - Mother,Paternal Grandparents, Diabetes - Siblings, Heart Disease - Father, Hypertension - Father. Social History Former smoker - quit in 1967. Medical History Eyes Patient has history of Cataracts - L eye Ear/Nose/Mouth/Throat Denies history of Chronic sinus problems/congestion, Middle ear problems Hematologic/Lymphatic Denies history of Anemia, Human Immunodeficiency Virus, Lymphedema [name], [name] ([id number]) [id number] Page 8 of 11 Respiratory Patient has history of Asthma Cardiovascular  Patient has history of Hypertension Endocrine Denies history of Type I Diabetes, Type II Diabetes Neurologic Patient has history of Neuropathy - Feet and finger Denies history of Seizure Disorder Oncologic Patient has history of Received Chemotherapy - 2012, Received Radiation - 2012 Hospitalization/Surgery History - Lower extremity venography 2020. - Intravascular ultrasound. - peripheral vascular intervention. - melanoma 2011. 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 Vitals Time Taken: 2:45 PM, Height: 63 in, Weight: 200 lbs, BMI: 35.4, Temperature: 98.8 F, Pulse: 70 bpm, Respiratory Rate: 18 breaths/min, Blood Pressure: 130/65 mmHg. Respiratory Normal work of breathing on room air. General Notes: 05/15/2023: The wound is about the same size today. The surface has very little slough accumulation, but remains fibrotic. Moisture balance is good. Her leg is less erythematous and edematous. Integumentary (Hair, Skin) Wound #2 status is Open. Original cause of wound was Blister. The date acquired was: 07/06/2022. The wound has been in treatment 41 weeks. The wound is located on the Left,Posterior Lower Leg. The wound measures 3.9cm length x 2.3cm width x 0.2cm depth; 7.045cm^2 area and 1.409cm^3 volume. There is Fat Layer (Subcutaneous Tissue) exposed. There is no tunneling or undermining noted. There is a medium amount of serosanguineous drainage noted. The wound margin is distinct with the outline attached to the wound base. There is medium (34-66%) red granulation within the wound bed. There is a medium (34-66%) amount of necrotic tissue within the wound bed including Eschar and Adherent Slough. The periwound skin appearance had no abnormalities noted for texture. The periwound skin appearance exhibited: Maceration, Rubor. The periwound skin appearance did not exhibit: Dry/Scaly. Periwound temperature was noted as No Abnormality. The periwound has tenderness on palpation. Assessment Active Problems ICD-10 Non-pressure chronic ulcer of other part of left lower leg with fat layer exposed Varicose veins of bilateral lower extremities with other complications Venous insufficiency (chronic) (peripheral) Polyneuropathy,  unspecified Localized edema Procedures Wound #2 Pre-procedure diagnosis of Wound #2 is a Venous Leg Ulcer located on the Left,Posterior Lower Leg .Severity of Tissue Pre Debridement is: Fat layer exposed. There was a Selective/Open Wound Non-Viable Tissue Debridement with a total area of 7.04 sq cm performed by Duanne Guess, MD. With the following instrument(s): Curette to remove Non-Viable tissue/material. Material removed includes [facility]. No specimens were taken. A time out was conducted at 15:15, prior to the start of the procedure. A Minimum amount of bleeding was controlled with Pressure. The procedure was tolerated well with a pain level of 0 throughout and a pain level of 0 following the procedure. Post Debridement Measurements: 3.9cm length x 2.3cm width x 0.2cm depth; 1.409cm^3 volume. [name], [name] ([id number]) [id number] Page 9 of 11 Character of Wound/Ulcer Post Debridement is stable. Severity of Tissue Post Debridement is: Fat layer exposed. Post procedure Diagnosis Wound #2: Same as Pre-Procedure General Notes: scribed for Dr. Lady Gary by Brenton Grills RN.Marland Kitchen Plan Follow-up Appointments: Return Appointment in 1 week. - Dr. Lady Gary Room 3 Anesthetic: Wound #2 Left,Posterior Lower Leg: (In clinic) Topical Lidocaine 4% applied to wound bed Bathing/ Shower/ Hygiene: May shower with protection but do not get wound dressing(s) wet. Protect dressing(s) with water repellant cover (for example, large plastic bag) or a cast cover and may then take shower. Edema Control - Lymphedema / SCD / Other: Lymphedema Pumps. Use Lymphedema pumps on leg(s) 2-3 times a  day for 45-60 minutes. If wearing any wraps or hose, do not remove them. Continue exercising as instructed. - Use x1 per day Avoid standing for long periods of time. Exercise regularly Off-Loading: Wound #2 Left,Posterior Lower Leg: Other: - Elevate legs at heart level or above heart level while  sitting. WOUND #2: - Lower Leg Wound Laterality: Left, Posterior Cleanser: Soap and Water 1 x Per Week/30 Days Discharge Instructions: May shower and wash wound with dial antibacterial soap and water prior to dressing change. Peri-Wound Care: Sween Lotion (Moisturizing lotion) 1 x Per Week/30 Days Discharge Instructions: Apply moisturizing lotion as directed Topical: Gentamicin 1 x Per Week/30 Days Discharge Instructions: As directed by physician Topical: Mupirocin Ointment 1 x Per Week/30 Days Discharge Instructions: Apply Mupirocin (Bactroban) as instructed Topical: Skintegrity Hydrogel 4 (oz) 1 x Per Week/30 Days Discharge Instructions: Apply hydrogel as directed Prim Dressing: Promogran Prisma Matrix, 4.34 (sq in) (silver collagen) 1 x Per Week/30 Days ary Discharge Instructions: Moisten collagen with saline or hydrogel Secondary Dressing: Zetuvit Plus 4x8 in 1 x Per Week/30 Days Discharge Instructions: Apply over primary dressing as directed. Secured With: 55M Medipore Scientist, research (life sciences) Surgical T 2x10 (in/yd) 1 x Per Week/30 Days ape Discharge Instructions: Secure with tape as directed. Com pression Wrap: Unnaboot [name]/Calamine, 4x10 (in/yd) 1 x Per Week/30 Days Discharge Instructions: Apply Unnaboot as directed. 05/15/2023: The wound is about the same size today. The surface has very little slough accumulation, but remains fibrotic. Moisture balance is good. She is responding nicely to the oral Keflex she is taking and her leg is less erythematous and edematous. I used a curette to debride the slough from her wound. We will continue the mixture of topical gentamicin and mupirocin, but I am going to change her contact layer to endoform. Continue calamine-based [employer]. She will complete her course of oral Keflex tomorrow. Follow-up in 1 week. Electronic Signature(s) Signed: 05/15/2023 3:20:51 PM By: Duanne Guess MD FACS Entered By: Duanne Guess on 05/15/2023  15:20:51 -------------------------------------------------------------------------------- HxROS Details Patient Name: Date of Service: [name], [name] 05/15/2023 2:30 PM Medical Record Number: [id number] Patient Account Number: [id number] Date of Birth/Sex: Treating RN: [birth year] (77 y.o. F) Primary Care Provider: Eustaquio Boyden Other Clinician: Referring Provider: Treating Provider/Extender: Priscille Loveless in Treatment: 69 Information Obtained From Patient Eyes Medical History: [name], [name] ([id number]) [id number] Page 10 of 11 Positive for: Cataracts - L eye Ear/Nose/Mouth/Throat Medical History: Negative for: Chronic sinus problems/congestion; Middle ear problems Hematologic/Lymphatic Medical History: Negative for: Anemia; Human Immunodeficiency Virus; Lymphedema Respiratory Medical History: Positive for: Asthma Past Medical History Notes: OSA on CPAP Cardiovascular Medical History: Positive for: Hypertension Past Medical History Notes: Cronic venous insufficiency 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 Gastrointestinal Medical History: Past Medical History Notes: liver cyst Endocrine Medical History: Negative for: Type I Diabetes; Type II Diabetes Musculoskeletal Medical History: Past Medical History Notes: arthritis Neurologic Medical History: Positive for: Neuropathy - Feet and finger Negative for: Seizure Disorder Past Medical History Notes: restless leg syndrome Oncologic Medical History: Positive for: Received Chemotherapy - 2012; Received Radiation - 2012 HBO Extended History Items Eyes: Cataracts Immunizations Pneumococcal Vaccine: Received Pneumococcal Vaccination: Yes Received Pneumococcal Vaccination On or After 60th Birthday: Yes Implantable Devices No devices added Hospitalization / Surgery History Type of Hospitalization/Surgery Lower extremity venography  2020 Intravascular ultrasound peripheral vascular intervention melanoma 2011 WRETHA, LARIS ([id number]) [id number] Page 35 of 30 Family and Social History Cancer: Yes - Mother,Paternal Grandparents; Diabetes: Yes - Siblings;  Heart Disease: Yes - Father; Hypertension: Yes - Father; Former smoker - quit in [employer]) Signed: 05/15/2023 4:07:16 PM By: Duanne Guess MD FACS Entered By: Duanne Guess on 05/15/2023 15:19:12 -------------------------------------------------------------------------------- SuperBill Details Patient Name: Date of Service: JAQUASIA, DOSCHER 05/15/2023 Medical Record Number: [id number] Patient Account Number: [id number] Date of Birth/Sex: Treating RN: [birth year] (77 y.o. [name] Primary Care Provider: Eustaquio Boyden Other Clinician: Referring Provider: Treating Provider/Extender: Priscille Loveless in Treatment: 41 Diagnosis Coding ICD-10 Codes Code Description [phone] Non-pressure chronic ulcer of other part of left lower leg with fat layer exposed I83.893 Varicose veins of bilateral lower extremities with other complications I87.2 Venous insufficiency (chronic) (peripheral) G62.9 Polyneuropathy, unspecified R60.0 Localized edema Facility Procedures : CPT4 Code: [id number] Description: 97597 - DEBRIDE WOUND 1ST 20 SQ CM OR < ICD-10 Diagnosis Description L97.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 Modifier: d Quantity: 1 Physician Procedures : CPT4 Code Description Modifier [id number] 99214 - WC PHYS LEVEL 4 - EST PT 25 ICD-10 Diagnosis Description L97.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d I83.893 Varicose veins of bilateral lower extremities with  other complications I87.2 Venous insufficiency (chronic) (peripheral) R60.0 Localized edema Quantity: 1 : [id number] 97597 - WC PHYS DEBR WO  ANESTH 20 SQ CM ICD-10 Diagnosis Description L97.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d Quantity: 1 Electronic Signature(s) Signed: 05/15/2023 3:21:09 PM By: Duanne Guess MD FACS Entered By: Duanne Guess on 05/15/2023 15:21:08

## 2023-05-22 ENCOUNTER — Encounter (HOSPITAL_BASED_OUTPATIENT_CLINIC_OR_DEPARTMENT_OTHER): Payer: Medicare Other | Admitting: General Surgery

## 2023-05-22 DIAGNOSIS — G4733 Obstructive sleep apnea (adult) (pediatric): Secondary | ICD-10-CM | POA: Diagnosis not present

## 2023-05-22 DIAGNOSIS — I10 Essential (primary) hypertension: Secondary | ICD-10-CM | POA: Diagnosis not present

## 2023-05-22 DIAGNOSIS — J45909 Unspecified asthma, uncomplicated: Secondary | ICD-10-CM | POA: Diagnosis not present

## 2023-05-22 DIAGNOSIS — G629 Polyneuropathy, unspecified: Secondary | ICD-10-CM | POA: Diagnosis not present

## 2023-05-22 DIAGNOSIS — L97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d: Secondary | ICD-10-CM | POA: Diagnosis not present

## 2023-05-22 DIAGNOSIS — I871 Compression of vein: Secondary | ICD-10-CM | POA: Diagnosis not present

## 2023-05-22 DIAGNOSIS — L97222 Non-pressure chronic ulcer of left calf with fat layer exposed: Secondary | ICD-10-CM | POA: Diagnosis not present

## 2023-05-22 DIAGNOSIS — I872 Venous insufficiency (chronic) (peripheral): Secondary | ICD-10-CM | POA: Diagnosis not present

## 2023-05-22 NOTE — Progress Notes (Signed)
[name], [name] ([id number]) [id number] Page 1 of 8 Visit Report for 05/08/2023 Arrival Information Details Patient Name: Date of Service: [name], [name] 05/08/2023 2:30 PM Medical Record Number: [id number] Patient Account Number: [id number] Date of Birth/Sex: Treating RN: 02-21-1946 (77 y.o. F) Primary Care Khalib Fendley: Eustaquio Boyden Other Clinician: Referring Raydan Schlabach: Treating Temple Sporer/Extender: Priscille Loveless in Treatment: 40 Visit Information History Since Last Visit All ordered tests and consults were completed: No Patient Arrived: Ambulatory Added or deleted any medications: No Arrival Time: 14:38 Any new allergies or adverse reactions: No Accompanied By: self Had a fall or experienced change in No Transfer Assistance: None activities of daily living that may affect Patient Identification Verified: Yes risk of falls: Secondary Verification Process Completed: Yes Signs or symptoms of abuse/neglect since last visito No Patient Requires Transmission-Based Precautions: No Hospitalized since last visit: No Patient Has Alerts: No Implantable device outside of the clinic excluding No cellular tissue based products placed in the center since last visit: Pain Present Now: No Electronic Signature(s) Signed: 05/08/2023 4:35:47 PM By: Dayton Scrape Entered By: Dayton Scrape on 05/08/2023 14:39:01 -------------------------------------------------------------------------------- Compression Therapy Details Patient Name: Date of Service: [name], [name] 05/08/2023 2:30 PM Medical Record Number: [id number] Patient Account Number: [id number] Date of Birth/Sex: Treating RN: 03-Nov-1946 (77 y.o. [name] Primary Care Cedra Villalon: Eustaquio Boyden Other Clinician: Referring Mamye Bolds: Treating Netha Dafoe/Extender: Katharina Caper Weeks in Treatment: 40 Compression Therapy Performed for Wound Assessment: Wound #2  Left,Posterior Lower Leg Performed By: Clinician Brenton Grills, RN Compression Type: Three Layer Post Procedure Diagnosis Same as Pre-procedure Notes Scribed for Dr Lady Gary by Brenton Grills, RN Electronic Signature(s) Signed: 05/22/2023 7:59:22 AM By: Brenton Grills Entered By: Brenton Grills on 05/08/2023 15:36:48 [name] ([id number]) [id number] Page 2 of 8 -------------------------------------------------------------------------------- Encounter Discharge Information Details Patient Name: Date of Service: [name], [name] 05/08/2023 2:30 PM Medical Record Number: [id number] Patient Account Number: [id number] Date of Birth/Sex: Treating RN: Oct 09, 1946 (77 y.o. [name] Primary Care Makayla Confer: Eustaquio Boyden Other Clinician: Referring Ethyle Tiedt: Treating Ezri Landers/Extender: Priscille Loveless in Treatment: 40 Encounter Discharge Information Items Post Procedure Vitals Discharge Condition: Stable Temperature (F): 98.6 Ambulatory Status: Ambulatory Pulse (bpm): 74 Discharge Destination: Home Respiratory Rate (breaths/min): 18 Transportation: Private Auto Blood Pressure (mmHg): 142/68 Accompanied By: self Schedule Follow-up Appointment: Yes Clinical Summary of Care: Patient Declined Electronic Signature(s) Signed: 05/22/2023 7:59:22 AM By: Brenton Grills Entered By: Brenton Grills on 05/08/2023 15:53:53 -------------------------------------------------------------------------------- Lower Extremity Assessment Details Patient Name: Date of Service: [name], [name] 05/08/2023 2:30 PM Medical Record Number: [id number] Patient Account Number: [id number] Date of Birth/Sex: Treating RN: 30-Mar-1946 (77 y.o. [name] Primary Care Ova Meegan: Eustaquio Boyden Other Clinician: Referring Orvil Faraone: Treating Inetha Maret/Extender: Katharina Caper Weeks in Treatment: 40 Edema Assessment Assessed: [name]: No]  Franne Forts: No] Edema: [Left: Ye] [Right: s] Calf Left: Right: Point of Measurement: 31 cm From Medial Instep 49 cm Ankle Left: Right: Point of Measurement: 9 cm From Medial Instep 25.2 cm Vascular Assessment Pulses: Dorsalis Pedis Palpable: [Left:Yes] Electronic Signature(s) Signed: 05/22/2023 7:59:22 AM By: Brenton Grills Entered By: Brenton Grills on 05/08/2023 15:23:03 [name] ([id number]) [id number] Page 3 of 8 -------------------------------------------------------------------------------- Multi Wound Chart Details Patient Name: Date of Service: [name], [name] 05/08/2023 2:30 PM Medical Record Number: [id number] Patient Account Number: [id number] Date of Birth/Sex: Treating RN: Mar 31, 1946 (77 y.o. F) Primary Care Liley Rake: Eustaquio Boyden Other Clinician: Referring Moses Odoherty: Treating Johnay Mano/Extender: Priscille Loveless in Treatment: 40 Vital  Signs Height(in): 63 Pulse(bpm): 71 Weight(lbs): 200 Blood Pressure(mmHg): 141/82 Body Mass Index(BMI): 35.4 Temperature(F): 97.9 Respiratory Rate(breaths/min): 20 [2:Photos: No Photos Left, Posterior Lower Leg Wound Location: Blister Wounding Event: Venous Leg Ulcer Primary Etiology: Cataracts, Asthma, Hypertension, Comorbid History: Neuropathy, Received Chemotherapy, Received Radiation 07/06/2022 Date Acquired: 40 Weeks of  Treatment: Open Wound Status: No Wound Recurrence: 3.9x2.1x0.2 Measurements L x W x D (cm) 6.432 A (cm) : rea 1.286 Volume (cm) : 6.40% % Reduction in A rea: -87.20% % Reduction in Volume: Full Thickness Without Exposed Classification: Support Structures  Medium Exudate A mount: Serosanguineous Exudate Type: red, brown Exudate Color: Distinct, outline attached Wound Margin: Medium (34-66%) Granulation A mount: Red Granulation Quality: Medium (34-66%) Necrotic A mount: Eschar, Adherent Slough Necrotic  Tissue: Fat Layer (Subcutaneous Tissue): Yes N/A Exposed  Structures: Fascia: No Tendon: No Muscle: No Joint: No Bone: No Small (1-33%) Epithelialization: Debridement - Selective/Open Wound N/A Debridement: Pre-procedure Verification/Time Out 15:30 Taken:  Lidocaine 4% Topical Solution Pain Control: Slough Tissue Debrided: Non-Viable Tissue Level: 6.43 Debridement A (sq cm): rea Curette Instrument: Minimum Bleeding: Pressure Hemostasis A chieved: 0 Procedural Pain: 0 Post Procedural Pain: Procedure was  tolerated well Debridement Treatment Response: 3.9x2.1x0.2 Post Debridement Measurements L x W x D (cm) 1.286 Post Debridement Volume: (cm) Scarring: Yes Periwound Skin Texture: Maceration: Yes Periwound Skin Moisture: Dry/Scaly: No Rubor: Yes Periwound  Skin Color: No Abnormality Temperature: Yes Tenderness on Palpation: Compression Therapy Procedures Performed: Debridement] [N/A:N/A N/A N/A N/A N/A N/A N/A N/A N/A N/A N/A N/A N/A N/A N/A N/A N/A N/A N/A N/A N/A N/A N/A N/A N/A N/A N/A N/A N/A N/A N/A  N/A N/A N/A N/A N/A N/A N/A N/A N/A N/A N/A N/A] Treatment Notes Wound #2 (Lower Leg) Wound Laterality: Left, Posterior Cleanser Soap and Water Discharge Instruction: May shower and wash wound with dial antibacterial soap and water prior to dressing change. Peri-Wound Care Sween Lotion (Moisturizing lotion) Discharge Instruction: Apply moisturizing lotion as directed Topical Gentamicin Discharge Instruction: As directed by physician Mupirocin Ointment Discharge Instruction: Apply Mupirocin (Bactroban) as instructed Skintegrity Hydrogel 4 (oz) Discharge Instruction: Apply hydrogel as directed Primary Dressing Promogran Prisma Matrix, 4.34 (sq in) (silver collagen) Discharge Instruction: Moisten collagen with saline or hydrogel Secondary Dressing Zetuvit Plus 4x8 in Discharge Instruction: Apply over primary dressing as directed. Secured With [employer] T 2x10 (in/yd) ape Discharge Instruction: Secure with tape as  directed. Compression Wrap Unnaboot w/Calamine, 4x10 (in/yd) Discharge Instruction: Apply Unnaboot as directed. Compression Stockings Add-Ons Electronic Signature(s) Signed: 05/08/2023 3:59:25 PM By: Duanne Guess MD FACS Entered By: Duanne Guess on 05/08/2023 15:59:24 -------------------------------------------------------------------------------- Multi-Disciplinary Care Plan Details Patient Name: Date of Service: [name], [name] 05/08/2023 2:30 PM Medical Record Number: [id number] Patient Account Number: [id number] Date of Birth/Sex: Treating RN: 10-25-1946 (77 y.o. [name] Primary Care Mayco Walrond: Eustaquio Boyden Other Clinician: Referring Kailei Cowens: Treating Savera Donson/Extender: Priscille Loveless in Treatment: 40 Multidisciplinary Care Plan reviewed with physician Active Inactive Venous Leg Ulcer Nursing Diagnoses: Knowledge deficit related to disease process and management Potential for venous Insuffiency (use before diagnosis confirmed) REMA, LIEVANOS ([id number]) [id number] Page 5 of 8 Goals: Patient will maintain optimal edema control Date Initiated: 02/07/2023 Target Resolution Date: 07/05/2023 Goal Status: Active Interventions: Assess peripheral edema status every visit. Compression as ordered Treatment Activities: Therapeutic compression applied : 02/07/2023 Notes: Wound/Skin Impairment Nursing Diagnoses: Impaired tissue integrity Goals: Ulcer/skin breakdown will have a volume reduction of 30% by week 4 Date Initiated: 08/03/2022  Date Inactivated: 09/10/2022 Target Resolution Date: 08/31/2022 Unmet Reason: uncontrolled edema, Goal Status: Unmet acute infection Ulcer/skin breakdown will have a volume reduction of 50% by week 8 Date Initiated: 09/10/2022 Target Resolution Date: 07/05/2023 Goal Status: Active Interventions: Assess ulceration(s) every visit Treatment Activities: Skin care regimen  initiated : 08/03/2022 Notes: [place] ordered. 10/30 RX Levo started. 09/10/22 Electronic Signature(s) Signed: 05/22/2023 7:59:22 AM By: Brenton Grills Entered By: Brenton Grills on 05/08/2023 15:24:04 -------------------------------------------------------------------------------- Pain Assessment Details Patient Name: Date of Service: [name], [name] 05/08/2023 2:30 PM Medical Record Number: [id number] Patient Account Number: [id number] Date of Birth/Sex: Treating RN: 02-17-1946 (77 y.o. F) Primary Care Elo Marmolejos: Eustaquio Boyden Other Clinician: Referring Angellina Ferdinand: Treating Raynard Mapps/Extender: Priscille Loveless in Treatment: 40 Active Problems Location of Pain Severity and Description of Pain Patient Has Paino No Site Locations NYISHA, CLIPPARD ([id number]) [id number] Page 6 of 8 Pain Management and Medication Current Pain Management: Electronic Signature(s) Signed: 05/08/2023 4:35:47 PM By: Dayton Scrape Entered By: Dayton Scrape on 05/08/2023 14:39:31 -------------------------------------------------------------------------------- Patient/Caregiver Education Details Patient Name: Date of Service: [name] 7/3/2024andnbsp2:30 PM Medical Record Number: [id number] Patient Account Number: [id number] Date of Birth/Gender: Treating RN: January 07, 1946 (77 y.o. [name] Primary Care Physician: Eustaquio Boyden Other Clinician: Referring Physician: Treating Physician/Extender: Priscille Loveless in Treatment: 40 Education Assessment Education Provided To: Patient Education Topics Provided Wound/Skin Impairment: Methods: Explain/Verbal Responses: State content correctly Electronic Signature(s) Signed: 05/22/2023 7:59:22 AM By: Brenton Grills Entered By: Brenton Grills on 05/08/2023 15:24:25 -------------------------------------------------------------------------------- Wound Assessment Details Patient  Name: Date of Service: [name], [name] 05/08/2023 2:30 PM Medical Record Number: [id number] Patient Account Number: [id number] Date of Birth/Sex: Treating RN: 04/11/1946 (77 y.o. F) Primary Care Marsha Hillman: Eustaquio Boyden Other Clinician: Referring Meeya Goldin: Treating Akeia Perot/Extender: Katharina Caper [place], [name] ([id number]) [id number] Page 7 of 8 Weeks in Treatment: 40 Wound Status Wound Number: 2 Primary Venous Leg Ulcer Etiology: Wound Location: Left, Posterior Lower Leg Wound Open Wounding Event: Blister Status: Date Acquired: 07/06/2022 Comorbid Cataracts, Asthma, Hypertension, Neuropathy, Received Weeks Of Treatment: 40 History: Chemotherapy, Received Radiation Clustered Wound: No Wound Measurements Length: (cm) 3.9 Width: (cm) 2.1 Depth: (cm) 0.2 Area: (cm) 6.432 Volume: (cm) 1.286 % Reduction in Area: 6.4% % Reduction in Volume: -87.2% Epithelialization: Small (1-33%) Wound Description Classification: Full Thickness Without Exposed Support Structures Wound Margin: Distinct, outline attached Exudate Amount: Medium Exudate Type: Serosanguineous Exudate Color: red, brown Foul Odor After Cleansing: No Slough/Fibrino Yes Wound Bed Granulation Amount: Medium (34-66%) Exposed Structure Granulation Quality: Red Fascia Exposed: No Necrotic Amount: Medium (34-66%) Fat Layer (Subcutaneous Tissue) Exposed: Yes Necrotic Quality: Eschar, Adherent Slough Tendon Exposed: No Muscle Exposed: No Joint Exposed: No Bone Exposed: No Periwound Skin Texture Texture Color No Abnormalities Noted: Yes No Abnormalities Noted: No Rubor: Yes Moisture No Abnormalities Noted: No Temperature / Pain Dry / Scaly: No Temperature: No Abnormality Maceration: Yes Tenderness on Palpation: Yes Electronic Signature(s) Signed: 05/08/2023 4:35:47 PM By: Dayton Scrape Entered By: Dayton Scrape on 05/08/2023  14:48:45 -------------------------------------------------------------------------------- Vitals Details Patient Name: Date of Service: [name]. 05/08/2023 2:30 PM Medical Record Number: [id number] Patient Account Number: [id number] Date of Birth/Sex: Treating RN: May 11, 1946 (77 y.o. F) Primary Care Karleen Seebeck: Eustaquio Boyden Other Clinician: Referring Kahliyah Dick: Treating Lakynn Halvorsen/Extender: Priscille Loveless in Treatment: 40 Vital Signs Time Taken: 02:39 Temperature (F): 97.9 Height (in): 63 Pulse (bpm): 71 Weight (lbs): 200 Respiratory Rate (breaths/min): 20 Body Mass Index (BMI): 35.4 Blood Pressure (mmHg): 141/82 Reference Range:  80 - 120 mg / dl Electronic Signature(s) Signed: 05/08/2023 4:35:47 PM By: Louann Liv, [name] ([id number]) PM By: Cathlean Cower.pdf Page 8 of 8 Signed: 05/08/2023 4:35:47 Entered By: Dayton Scrape on 05/08/2023 14:39:24

## 2023-05-22 NOTE — Progress Notes (Signed)
[name] ([id number]) [id number] Page 1 of 12 Visit Report for 05/08/2023 Chief Complaint Document Details Patient Name: Date of Service: [name], [name] 05/08/2023 2:30 PM Medical Record Number: [id number] Patient Account Number: [id number] Date of Birth/Sex: Treating RN: 06-18-1946 (77 y.o. F) Primary Care Provider: Eustaquio Boyden Other Clinician: Referring Provider: Treating Provider/Extender: Priscille Loveless in Treatment: 40 Information Obtained from: Patient Chief Complaint Patient presents for treatment of an open ulcer due to venous insufficiency Electronic Signature([name]) Signed: 05/08/2023 3:59:38 PM By: Duanne Guess MD FACS Entered By: Duanne Guess on 05/08/2023 15:59:38 -------------------------------------------------------------------------------- Debridement Details Patient Name: Date of Service: [name], [name] 05/08/2023 2:30 PM Medical Record Number: [id number] Patient Account Number: [id number] Date of Birth/Sex: Treating RN: 05/16/1946 (77 y.o. [name] Primary Care Provider: Eustaquio Boyden Other Clinician: Referring Provider: Treating Provider/Extender: Priscille Loveless in Treatment: 40 Debridement Performed for Assessment: Wound #2 Left,Posterior Lower Leg Performed By: Physician Duanne Guess, MD Debridement Type: Debridement Severity of Tissue Pre Debridement: Fat layer exposed Level of Consciousness (Pre-procedure): Awake and Alert Pre-procedure Verification/Time Out Yes - 15:30 Taken: Start Time: 15:35 Pain Control: Lidocaine 4% T opical Solution Percent of Wound Bed Debrided: 100% T Area Debrided (cm): otal 6.43 Tissue and other material debrided: Viable, Slough, Slough Level: Non-Viable Tissue Debridement Description: Selective/Open Wound Instrument: Curette Bleeding: Minimum Hemostasis Achieved: Pressure End Time: 15:38 Procedural Pain: 0 Post  Procedural Pain: 0 Response to Treatment: Procedure was tolerated well Level of Consciousness (Post- Awake and Alert procedure): Post Debridement Measurements of Total Wound Length: (cm) 3.9 Width: (cm) 2.1 Depth: (cm) 0.2 Volume: (cm) 1.286 Character of Wound/Ulcer Post Debridement: Improved [name], [name] ([id number]) [id number] Page 2 of 12 Severity of Tissue Post Debridement: Fat layer exposed Post Procedure Diagnosis Same as Pre-procedure Notes Scribed for Dr Lady Gary by Brenton Grills, RN Electronic Signature([name]) Signed: 05/08/2023 4:41:42 PM By: Duanne Guess MD FACS Signed: 05/22/2023 7:59:22 AM By: Brenton Grills Entered By: Brenton Grills on 05/08/2023 15:35:59 -------------------------------------------------------------------------------- HPI Details Patient Name: Date of Service: [name] 05/08/2023 2:30 PM Medical Record Number: [id number] Patient Account Number: [id number] Date of Birth/Sex: Treating RN: 05/23/1946 (77 y.o. F) Primary Care Provider: Eustaquio Boyden Other Clinician: Referring Provider: Treating Provider/Extender: Priscille Loveless in Treatment: 40 History of Present Illness HPI Description: ADMISSION This is a 77 year old woman with a history of chronic venous insufficiency status post saphenous vein ablations in 2010 and 2016. 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 She presents today with an open venous ulcer on her left lower leg. It has been present for a little over 2 weeks. She saw her primary care provider who apparently swabbed the wound and grew out Pseudomonas. She just completed a course of ciprofloxacin for this. ABI was 0.91. She reports that she has had previous issues with ulcers in this same location, stemming back to a punch biopsy taken by a dermatologist many years ago. She has had several skin substitutes applied to the area that have ultimately  resulted in healing on prior occasions. She has 2 small ulcers on her left medial lower leg. There is slough accumulation in both of them. The more anterior of the 2 is quite small and has some soft slough on the surface, underneath which there is good granulation tissue. The more medial wound also has slough accumulation, but the underlying surface is fairly fibrotic and gritty. This is consistent with her provided history of multiple ulcers in  the same location. 08/03/2022: The anterior wound is smaller today with just a little bit of slough accumulation. The more medial wound continues to be very sensitive and fibrotic with slough buildup. 08/13/2022: The anterior wound is closed. The more medial wound remains sensitive with a fairly fibrotic surface. There is more granulation tissue filling in, however, and there is less slough than on prior occasions. 08/20/2022: The anterior wound remains closed. The more medial wound has filled with granulation tissue but still has a fair amount of slough on the surface and remains fairly tender. Unfortunately, she has developed 2 small ulcers just proximal to this. The fat layer is exposed in each. She says that over the weekend, she felt a burning sensation in that location. 08/27/2022: The 2 small wounds proximal to the main wound have merged into a single site. The wounds look a little bit dry, but they are quite clean without significant slough accumulation. They remain quite tender. 09/03/2022: All of the wounds have now merged into 1 large wound. There is a strong odor coming from the wound and the surface is not particularly viable. It is extremely painful for her today. 09/10/2022: The wound is less black and purple this week but still does not look particularly viable. The surface is desiccated. The culture that I took last week returned with a polymicrobial population including Pseudomonas. I prescribed both Augmentin and levofloxacin. She has not yet  picked up levofloxacin, as her pharmacy only notified her yesterday that it was ready. We have ordered a Keystone topical compounded antibiotic, but it has not yet come. 09/17/2022: The wound surface is markedly improved today. There is still an area of grayish-looking muscle but the rest appears significantly more viable. There is still a layer of slough on the surface. She still has a couple days left of oral antibiotic therapy. She has her Jodie Echevaria compounded topical antibiotic with her today. 09/24/2022: She has completed her oral antibiotic therapy. The wound surface is much cleaner today and more viable without any necrotic tissue. It is a bit desiccated, however. 10/01/2022: The moisture balance of the wound has improved. There is a layer of yellow slough on the surface, but beneath this, the wound is more pink. 10/08/2022: The wound is smaller and cleaner today with a layer of slough present. She is having less pain. 12/18; this patient has a new wound on the right lateral ankle which apparently started after a cat scratched her. Secondarily she managed to drop a cake pan on the same area. This is very painful. The original wound was on the left posterior calf we have been using [employer] under an [employer]. The Unna boot fell down and apparently she was in for a nurse visit perhaps last week and that 1 fell down as well This patient has central venous mediated hypertension. For member correctly she has a stent placed in the left common iliac artery by Dr. Randie Heinz some years ago [facility] Thurner syndrome] she also has had venous ablations and I believe a history of a DVT VON, INSCOE ([id number]) [id number] Page 3 of 12 The patient has compression pumps at home but she does not use them 10/30/2022: The patient says that her wounds burned all week with the [facility] classic in place. She has a fair amount of slough and nonviable subcutaneous tissue present  on the right ankle. The left posterior calf wound is cleaner than I have seen it to date. Both remain fairly tender. 11/14/2022: Both  wounds have substantial slough and eschar accumulation. They remain extremely tender. 11/21/2022: The wounds are cleaner this week, but still have slough and eschar accumulation. She continues to describe burning pain in both wounds, but upon further questioning, she actually has burning in both of her feet that radiates up her legs and includes to the wounds. 11/28/2022: Both wounds have slough and eschar accumulation, as well as adherent silver alginate that was difficult to remove. She continues to have pain out of proportion to the extent of her wounds. Her PCP did initiate Lyrica which she started taking last night. The dose is quite low, only 25 mg, but apparently there is a plan for upward titration, assuming she tolerates the medication. 12/05/2022: The wounds are both a little bit smaller today, but have significant slough accumulation, as usual. They remain exquisitely tender. She is now taking Lyrica twice a day. 12/19/2022: Both of her wounds look significantly better this week. There is slough buildup on both, but they are smaller. She seems to be getting a good response from Lyrica as she is having much less pain. 12/26/2022: The wound on her right lateral ankle is nearly closed. The wound on her left posterior calf is smaller but still has a lot of slough accumulation. They remain tender. 01/02/2023: The right lateral ankle wound is down to just a couple of millimeters. It is much less tender than on prior occasions. There is minimal slough buildup. The wound on her posterior calf continues to contract, as well. It has thick slough buildup and remains fairly tender. 01/09/2023: The right lateral ankle wound is nearly closed. He remains tender. There is just a little eschar on the surface. The wound on her posterior calf has improved quite a bit. There is still  slough on the surface, but the more proximal area has epithelialized substantially and there is no longer any gray discoloration to her tissue. 3/13; patient presents for follow-up. Her right lateral ankle wound is almost healed. She has a wound to her left posterior calf with slough and granulation tissue. We have been using Santyl under Unna boots bilaterally to the lower extremities. She has no issues or complaints today. 01/30/2023: The right lateral ankle wound is down to just a pinhole under a layer of eschar. The left posterior calf wound is quite a bit improved since the last time I saw it. There is still a layer of slough on the surface but there has been more epithelialization. 02/07/2023: The right lateral ankle wound still has some eschar on the surface. The left posterior calf wound is about the same size, but with more areas of epithelialization. 02/20/2023: The right lateral ankle wound is healed. The left posterior calf wound is smaller and is essentially flush with the surrounding skin, but the surface is quite dry. Her leg wrap slipped and her calf is quite swollen above the level of the wound. 03/01/2023: The right lateral ankle wound reopened and she also suffered a fall that resulted in a small wound on her right anterior tibial surface. The left posterior calf wound has a much better moisture balance today. There is a layer of slough on the surface. The [employer] worked much better for her as far as compression this week. 03/06/2023: The right lateral ankle wound has a layer of eschar on the surface and is too painful for me to debride. The anterior tibial surface wound is more or less healed; it is a very superficial abrasion at this point. The  left posterior calf wound has a layer of slough on the surface. Her edema control is better. 03/13/2023: The right lateral ankle wound has a layer of eschar on the surface. Once this was debrided, it was found to be healed. The anterior tibial  surface wound has a layer of stuck silver alginate on the surface. This was removed to reveal a very superficial abrasion but not much change from last week. The left posterior calf wound is smaller and has its usual layer of accumulated slough. 03/25/2023: The anterior tibial surface wound has eschar and silver alginate stuck to it. Once this was removed, the wound was found to be healed. The left posterior calf wound is roughly the same size and has a little bit thicker layer of accumulated slough. 04/03/2023: She has had more drainage from the posterior calf wound over the last week and the layer of slough is thicker. She also says it is more tender. 04/10/2023: Continued increase in drainage from her wound. The wound remains tender with a layer of slough on the surface. It does not appear as viable this week. She also has erythema, swelling, and warmth in her entire left lower leg extending into the midfoot. 04/17/2023: The wound is about the same size this week. There is fibrotic adherent slough, but the drainage is not as profuse. She is currently taking levofloxacin. She is complaining of an increase in the burning sensation at her wound. 04/24/2023: The wound measured smaller this week and is much cleaner-looking. She completed her course of levofloxacin. Her PCP increased her dose of Lyrica and she reports that the burning has stopped. 05/01/2023: The wound is smaller again today and quite clean with only minimal slough on the surface. 05/08/2023: The wound is smaller again today. It is fairly clean surface is a bit fibrotic. Moisture balance is better. Her leg is more swollen and erythematous today, however. Electronic Signature([name]) Signed: 05/08/2023 4:00:31 PM By: Duanne Guess MD FACS Entered By: Duanne Guess on 05/08/2023 16:00:31 -------------------------------------------------------------------------------- Physical Exam Details Patient Name: Date of Service: [name], [name]  05/08/2023 2:30 PM [name] ([id number]) [id number] Page 4 of 12 Medical Record Number: [id number] Patient Account Number: [id number] Date of Birth/Sex: Treating RN: 11-19-1945 (77 y.o. F) Primary Care Provider: Eustaquio Boyden Other Clinician: Referring Provider: Treating Provider/Extender: Priscille Loveless in Treatment: 40 Constitutional . . . . no acute distress. Respiratory Normal work of breathing on room air. Notes 05/08/2023: The wound is smaller again today. It is fairly clean surface is a bit fibrotic. Moisture balance is better. Her leg is more swollen and erythematous today, however. Electronic Signature([name]) Signed: 05/08/2023 4:01:04 PM By: Duanne Guess MD FACS Entered By: Duanne Guess on 05/08/2023 16:01:04 -------------------------------------------------------------------------------- Physician Orders Details Patient Name: Date of Service: [name], [name] 05/08/2023 2:30 PM Medical Record Number: [id number] Patient Account Number: [id number] Date of Birth/Sex: Treating RN: 1946/08/27 (77 y.o. [name] Primary Care Provider: Eustaquio Boyden Other Clinician: Referring Provider: Treating Provider/Extender: Priscille Loveless in Treatment: [phone] Verbal / Phone Orders: No Diagnosis Coding ICD-10 Coding Code Description [phone] Non-pressure chronic ulcer of other part of left lower leg with fat layer exposed I83.893 Varicose veins of bilateral lower extremities with other complications I87.2 Venous insufficiency (chronic) (peripheral) G62.9 Polyneuropathy, unspecified R60.0 Localized edema Follow-up Appointments ppointment in 1 week. - Dr. Lady Gary Room 3 Return A Anesthetic Wound #2 Left,Posterior Lower Leg (In clinic) Topical Lidocaine 4% applied to wound bed Bathing/ Shower/ Hygiene May  shower with protection but do not get wound dressing([name]) wet. Protect dressing([name]) with  water repellant cover (for example, large plastic bag) or a cast cover and may then take shower. Edema Control - Lymphedema / SCD / Other Left Lower Extremity Lymphedema Pumps. Use Lymphedema pumps on leg([name]) 2-3 times a day for 45-60 minutes. If wearing any wraps or hose, do not remove them. Continue exercising as instructed. - Use x1 per day Avoid standing for long periods of time. Exercise regularly Off-Loading Wound #2 Left,Posterior Lower Leg Other: - Elevate legs at heart level or above heart level while sitting. Wound Treatment Wound #2 - Lower Leg Wound Laterality: Left, Posterior Cleanser: Soap and Water 1 x Per Week/30 Days [name], [name] ([id number]) [id number] Page 5 of 12 Discharge Instructions: May shower and wash wound with dial antibacterial soap and water prior to dressing change. Peri-Wound Care: Sween Lotion (Moisturizing lotion) 1 x Per Week/30 Days Discharge Instructions: Apply moisturizing lotion as directed Topical: Gentamicin 1 x Per Week/30 Days Discharge Instructions: As directed by physician Topical: Mupirocin Ointment 1 x Per Week/30 Days Discharge Instructions: Apply Mupirocin (Bactroban) as instructed Topical: Skintegrity Hydrogel 4 (oz) 1 x Per Week/30 Days Discharge Instructions: Apply hydrogel as directed Prim Dressing: Promogran Prisma Matrix, 4.34 (sq in) (silver collagen) 1 x Per Week/30 Days ary Discharge Instructions: Moisten collagen with saline or hydrogel Secondary Dressing: Zetuvit Plus 4x8 in 1 x Per Week/30 Days Discharge Instructions: Apply over primary dressing as directed. Secured With: 3M Medipore Scientist, research (life sciences) Surgical T 2x10 (in/yd) 1 x Per Week/30 Days ape Discharge Instructions: Secure with tape as directed. Compression Wrap: Unnaboot w/Calamine, 4x10 (in/yd) 1 x Per Week/30 Days Discharge Instructions: Apply Unnaboot as directed. Patient Medications llergies: sulfa antibiotics, Voltaren, doxycycline,  Neoprene, adhesive, latex A Notifications Medication Indication Start End 05/08/2023 cephalexin DOSE oral 500 mg capsule - 1 capsule p.o. 4 times daily x 7 days Electronic Signature([name]) Signed: 05/08/2023 4:03:40 PM By: Duanne Guess MD FACS Entered By: Duanne Guess on 05/08/2023 16:03:39 -------------------------------------------------------------------------------- Problem List Details Patient Name: Date of Service: [name], [name] 05/08/2023 2:30 PM Medical Record Number: [id number] Patient Account Number: [id number] Date of Birth/Sex: Treating RN: Aug 06, 1946 (77 y.o. [name] Primary Care Provider: Eustaquio Boyden Other Clinician: Referring Provider: Treating Provider/Extender: Priscille Loveless in Treatment: 40 Active Problems ICD-10 Encounter Code Description Active Date MDM Diagnosis [id number] Non-pressure chronic ulcer of other part of left lower leg with fat layer exposed 07/27/2022 No Yes I83.893 Varicose veins of bilateral lower extremities with other complications 07/27/2022 No Yes I87.2 Venous insufficiency (chronic) (peripheral) 07/27/2022 No Yes NALLELY, YOST ([id number]) [id number] Page 6 of 12 G62.9 Polyneuropathy, unspecified 07/27/2022 No Yes R60.0 Localized edema 07/27/2022 No Yes Inactive Problems ICD-10 Code Description Active Date Inactive Date L03.115 Cellulitis of right lower limb 10/22/2022 10/22/2022 L97.818 Non-pressure chronic ulcer of other part of right lower leg with other specified severity 03/01/2023 10/22/2022 Resolved Problems Electronic Signature([name]) Signed: 05/08/2023 3:59:18 PM By: Duanne Guess MD FACS Entered By: Duanne Guess on 05/08/2023 15:59:18 -------------------------------------------------------------------------------- Progress Note Details Patient Name: Date of Service: [name], [name] 05/08/2023 2:30 PM Medical Record Number: [id number] Patient Account Number:  [id number] Date of Birth/Sex: Treating RN: 09-13-1946 (77 y.o. F) Primary Care Provider: Eustaquio Boyden Other Clinician: Referring Provider: Treating Provider/Extender: Priscille Loveless in Treatment: 40 Subjective Chief Complaint Information obtained from Patient Patient presents for treatment of an open ulcer due to venous insufficiency History of Present Illness (HPI) ADMISSION  This is a 77 year old woman with a history of chronic venous insufficiency status post saphenous vein ablations in 2010 and 2016. 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 She presents today with an open venous ulcer on her left lower leg. It has been present for a little over 2 weeks. She saw her primary care provider who apparently swabbed the wound and grew out Pseudomonas. She just completed a course of ciprofloxacin for this. ABI was 0.91. She reports that she has had previous issues with ulcers in this same location, stemming back to a punch biopsy taken by a dermatologist many years ago. She has had several skin substitutes applied to the area that have ultimately resulted in healing on prior occasions. She has 2 small ulcers on her left medial lower leg. There is slough accumulation in both of them. The more anterior of the 2 is quite small and has some soft slough on the surface, underneath which there is good granulation tissue. The more medial wound also has slough accumulation, but the underlying surface is fairly fibrotic and gritty. This is consistent with her provided history of multiple ulcers in the same location. 08/03/2022: The anterior wound is smaller today with just a little bit of slough accumulation. The more medial wound continues to be very sensitive and fibrotic with slough buildup. 08/13/2022: The anterior wound is closed. The more medial wound remains sensitive with a fairly fibrotic surface. There is more granulation tissue filling  in, however, and there is less slough than on prior occasions. 08/20/2022: The anterior wound remains closed. The more medial wound has filled with granulation tissue but still has a fair amount of slough on the surface and remains fairly tender. Unfortunately, she has developed 2 small ulcers just proximal to this. The fat layer is exposed in each. She says that over the weekend, she felt a burning sensation in that location. 08/27/2022: The 2 small wounds proximal to the main wound have merged into a single site. The wounds look a little bit dry, but they are quite clean without significant slough accumulation. They remain quite tender. 09/03/2022: All of the wounds have now merged into 1 large wound. There is a strong odor coming from the wound and the surface is not particularly viable. It is extremely painful for her today. 09/10/2022: The wound is less black and purple this week but still does not look particularly viable. The surface is desiccated. The culture that I took last week returned with a polymicrobial population including Pseudomonas. I prescribed both Augmentin and levofloxacin. She has not yet picked up levofloxacin, as her [name], [name] ([id number]) [id number] Page 7 of 12 pharmacy only notified her yesterday that it was ready. We have ordered a Keystone topical compounded antibiotic, but it has not yet come. 09/17/2022: The wound surface is markedly improved today. There is still an area of grayish-looking muscle but the rest appears significantly more viable. There is still a layer of slough on the surface. She still has a couple days left of oral antibiotic therapy. She has her Jodie Echevaria compounded topical antibiotic with her today. 09/24/2022: She has completed her oral antibiotic therapy. The wound surface is much cleaner today and more viable without any necrotic tissue. It is a bit desiccated, however. 10/01/2022: The moisture balance of the  wound has improved. There is a layer of yellow slough on the surface, but beneath this, the wound is more pink. 10/08/2022: The wound is smaller and cleaner today with  a layer of slough present. She is having less pain. 12/18; this patient has a new wound on the right lateral ankle which apparently started after a cat scratched her. Secondarily she managed to drop a cake pan on the same area. This is very painful. The original wound was on the left posterior calf we have been using [employer] under an [employer]. The Unna boot fell down and apparently she was in for a nurse visit perhaps last week and that 1 fell down as well This patient has central venous mediated hypertension. For member correctly she has a stent placed in the left common iliac artery by Dr. Randie Heinz some years ago [facility] [place] Thurner syndrome] she also has had venous ablations and I believe a history of a DVT The patient has compression pumps at home but she does not use them 10/30/2022: The patient says that her wounds burned all week with the [facility] classic in place. She has a fair amount of slough and nonviable subcutaneous tissue present on the right ankle. The left posterior calf wound is cleaner than I have seen it to date. Both remain fairly tender. 11/14/2022: Both wounds have substantial slough and eschar accumulation. They remain extremely tender. 11/21/2022: The wounds are cleaner this week, but still have slough and eschar accumulation. She continues to describe burning pain in both wounds, but upon further questioning, she actually has burning in both of her feet that radiates up her legs and includes to the wounds. 11/28/2022: Both wounds have slough and eschar accumulation, as well as adherent silver alginate that was difficult to remove. She continues to have pain out of proportion to the extent of her wounds. Her PCP did initiate Lyrica which she started taking last night. The dose is quite low, only 25 mg, but  apparently there is a plan for upward titration, assuming she tolerates the medication. 12/05/2022: The wounds are both a little bit smaller today, but have significant slough accumulation, as usual. They remain exquisitely tender. She is now taking Lyrica twice a day. 12/19/2022: Both of her wounds look significantly better this week. There is slough buildup on both, but they are smaller. She seems to be getting a good response from Lyrica as she is having much less pain. 12/26/2022: The wound on her right lateral ankle is nearly closed. The wound on her left posterior calf is smaller but still has a lot of slough accumulation. They remain tender. 01/02/2023: The right lateral ankle wound is down to just a couple of millimeters. It is much less tender than on prior occasions. There is minimal slough buildup. The wound on her posterior calf continues to contract, as well. It has thick slough buildup and remains fairly tender. 01/09/2023: The right lateral ankle wound is nearly closed. He remains tender. There is just a little eschar on the surface. The wound on her posterior calf has improved quite a bit. There is still slough on the surface, but the more proximal area has epithelialized substantially and there is no longer any gray discoloration to her tissue. 3/13; patient presents for follow-up. Her right lateral ankle wound is almost healed. She has a wound to her left posterior calf with slough and granulation tissue. We have been using Santyl under Unna boots bilaterally to the lower extremities. She has no issues or complaints today. 01/30/2023: The right lateral ankle wound is down to just a pinhole under a layer of eschar. The left posterior calf wound is quite a bit improved  since the last time I saw it. There is still a layer of slough on the surface but there has been more epithelialization. 02/07/2023: The right lateral ankle wound still has some eschar on the surface. The left posterior calf  wound is about the same size, but with more areas of epithelialization. 02/20/2023: The right lateral ankle wound is healed. The left posterior calf wound is smaller and is essentially flush with the surrounding skin, but the surface is quite dry. Her leg wrap slipped and her calf is quite swollen above the level of the wound. 03/01/2023: The right lateral ankle wound reopened and she also suffered a fall that resulted in a small wound on her right anterior tibial surface. The left posterior calf wound has a much better moisture balance today. There is a layer of slough on the surface. The [employer] worked much better for her as far as compression this week. 03/06/2023: The right lateral ankle wound has a layer of eschar on the surface and is too painful for me to debride. The anterior tibial surface wound is more or less healed; it is a very superficial abrasion at this point. The left posterior calf wound has a layer of slough on the surface. Her edema control is better. 03/13/2023: The right lateral ankle wound has a layer of eschar on the surface. Once this was debrided, it was found to be healed. The anterior tibial surface wound has a layer of stuck silver alginate on the surface. This was removed to reveal a very superficial abrasion but not much change from last week. The left posterior calf wound is smaller and has its usual layer of accumulated slough. 03/25/2023: The anterior tibial surface wound has eschar and silver alginate stuck to it. Once this was removed, the wound was found to be healed. The left posterior calf wound is roughly the same size and has a little bit thicker layer of accumulated slough. 04/03/2023: She has had more drainage from the posterior calf wound over the last week and the layer of slough is thicker. She also says it is more tender. 04/10/2023: Continued increase in drainage from her wound. The wound remains tender with a layer of slough on the surface. It does not appear  as viable this week. She also has erythema, swelling, and warmth in her entire left lower leg extending into the midfoot. 04/17/2023: The wound is about the same size this week. There is fibrotic adherent slough, but the drainage is not as profuse. She is currently taking levofloxacin. She is complaining of an increase in the burning sensation at her wound. 04/24/2023: The wound measured smaller this week and is much cleaner-looking. She completed her course of levofloxacin. Her PCP increased her dose of Lyrica and she reports that the burning has stopped. 05/01/2023: The wound is smaller again today and quite clean with only minimal slough on the surface. 05/08/2023: The wound is smaller again today. It is fairly clean surface is a bit fibrotic. Moisture balance is better. Her leg is more swollen and erythematous [name], [name] ([id number]) [id number] Page 8 of 12 today, however. Patient History Information obtained from Patient. Family History Cancer - Mother,Paternal Grandparents, Diabetes - Siblings, Heart Disease - Father, Hypertension - Father. Social History Former smoker - quit in 1967. Medical History Eyes Patient has history of Cataracts - L eye Ear/Nose/Mouth/Throat Denies history of Chronic sinus problems/congestion, Middle ear problems Hematologic/Lymphatic Denies history of Anemia, Human Immunodeficiency Virus, Lymphedema Respiratory Patient has history of Asthma  Cardiovascular Patient has history of Hypertension Endocrine Denies history of Type I Diabetes, Type II Diabetes Neurologic Patient has history of Neuropathy - Feet and finger Denies history of Seizure Disorder Oncologic Patient has history of Received Chemotherapy - 2012, Received Radiation - 2012 Hospitalization/Surgery History - Lower extremity venography 2020. - Intravascular ultrasound. - peripheral vascular intervention. - melanoma 2011. 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 Vitals Time Taken: 2:39 AM, Height: 63 in, Weight: 200 lbs, BMI: 35.4, Temperature: 97.9 F, Pulse: 71 bpm, Respiratory Rate: 20 breaths/min, Blood Pressure: 141/82 mmHg. Respiratory Normal work of breathing on room air. General Notes: 05/08/2023: The wound is smaller again today. It is fairly clean surface is a bit fibrotic. Moisture balance is better. Her leg is more swollen and erythematous today, however. Integumentary (Hair, Skin) Wound #2 status is Open. Original cause of wound was Blister. The date acquired was: 07/06/2022. The wound has been in treatment 40 weeks. The wound is located on the Left,Posterior Lower Leg. The wound measures 3.9cm length x 2.1cm width x 0.2cm depth; 6.432cm^2 area and 1.286cm^3 volume. There is Fat Layer (Subcutaneous Tissue) exposed. There is a medium amount of serosanguineous drainage noted. The wound margin is distinct with the outline attached to the wound base. There is medium (34-66%) red granulation within the wound bed. There is a medium (34-66%) amount of necrotic tissue within the wound bed including Eschar and Adherent Slough. The periwound skin appearance had no abnormalities noted for texture. The periwound skin appearance exhibited: Maceration, Rubor. The periwound skin appearance did not exhibit: Dry/Scaly. Periwound temperature was noted as No Abnormality. The periwound has tenderness on palpation. Assessment Active Problems ICD-10 Non-pressure chronic ulcer of other part of left lower leg with fat layer exposed Varicose veins of bilateral lower extremities with other complications Venous insufficiency (chronic) (peripheral) [name], [name] ([id number])  [id number] Page 9 of 12 Polyneuropathy, unspecified Localized edema Procedures Wound #2 Pre-procedure diagnosis of Wound #2 is a Venous Leg Ulcer located on the Left,Posterior Lower Leg .Severity of Tissue Pre Debridement is: Fat layer exposed. There was a Selective/Open Wound Non-Viable Tissue Debridement with a total area of 6.43 sq cm performed by Duanne Guess, MD. With the following instrument([name]): Curette to remove Viable tissue/material. Material removed includes [facility] after achieving pain control using Lidocaine 4% Topical Solution. A time out was conducted at 15:30, prior to the start of the procedure. A Minimum amount of bleeding was controlled with Pressure. The procedure was tolerated well with a pain level of 0 throughout and a pain level of 0 following the procedure. Post Debridement Measurements: 3.9cm length x 2.1cm width x 0.2cm depth; 1.286cm^3 volume. Character of Wound/Ulcer Post Debridement is improved. Severity of Tissue Post Debridement is: Fat layer exposed. Post procedure Diagnosis Wound #2: Same as Pre-Procedure General Notes: Scribed for Dr Lady Gary by Brenton Grills, RN. Pre-procedure diagnosis of Wound #2 is a Venous Leg Ulcer located on the Left,Posterior Lower Leg . There was a Three Layer Compression Therapy Procedure by Brenton Grills, RN. Post procedure Diagnosis Wound #2: Same as Pre-Procedure Notes: Scribed for Dr Lady Gary by Brenton Grills, RN. Plan Follow-up Appointments: Return Appointment in 1 week. - Dr. Lady Gary Room 3 Anesthetic: Wound #2 Left,Posterior Lower Leg: (In clinic) Topical Lidocaine 4% applied to wound bed Bathing/ Shower/ Hygiene: May shower with protection but  do not get wound dressing([name]) wet. Protect dressing([name]) with water repellant cover (for example, large plastic bag) or a cast cover and may then take shower. Edema Control - Lymphedema / SCD / Other: Lymphedema Pumps. Use Lymphedema pumps on leg([name]) 2-3  times a day for 45-60 minutes. If wearing any wraps or hose, do not remove them. Continue exercising as instructed. - Use x1 per day Avoid standing for long periods of time. Exercise regularly Off-Loading: Wound #2 Left,Posterior Lower Leg: Other: - Elevate legs at heart level or above heart level while sitting. The following medication([name]) was prescribed: cephalexin oral 500 mg capsule 1 capsule p.o. 4 times daily x 7 days starting 05/08/2023 WOUND #2: - Lower Leg Wound Laterality: Left, Posterior Cleanser: Soap and Water 1 x Per Week/30 Days Discharge Instructions: May shower and wash wound with dial antibacterial soap and water prior to dressing change. Peri-Wound Care: Sween Lotion (Moisturizing lotion) 1 x Per Week/30 Days Discharge Instructions: Apply moisturizing lotion as directed Topical: Gentamicin 1 x Per Week/30 Days Discharge Instructions: As directed by physician Topical: Mupirocin Ointment 1 x Per Week/30 Days Discharge Instructions: Apply Mupirocin (Bactroban) as instructed Topical: Skintegrity Hydrogel 4 (oz) 1 x Per Week/30 Days Discharge Instructions: Apply hydrogel as directed Prim Dressing: Promogran Prisma Matrix, 4.34 (sq in) (silver collagen) 1 x Per Week/30 Days ary Discharge Instructions: Moisten collagen with saline or hydrogel Secondary Dressing: Zetuvit Plus 4x8 in 1 x Per Week/30 Days Discharge Instructions: Apply over primary dressing as directed. Secured With: 32M Medipore Scientist, research (life sciences) Surgical T 2x10 (in/yd) 1 x Per Week/30 Days ape Discharge Instructions: Secure with tape as directed. Com pression Wrap: Unnaboot w/Calamine, 4x10 (in/yd) 1 x Per Week/30 Days Discharge Instructions: Apply Unnaboot as directed. 05/08/2023: The wound is smaller again today. It is fairly clean surface is a bit fibrotic. Moisture balance is better. Her leg is more swollen and erythematous today, however. I used a curette to debride slough from her wound. We will continue the mixture  of topical gentamicin and mupirocin. I changed the contact layer to Prisma silver collagen moistened with hydrogel to see if we can improve the moisture balance even further. Due to the erythema in her leg and the increase in swelling, and concern for early cellulitis I sent in a prescription for Keflex. We will continue Unna boot compression. Follow-up in 1 week. Electronic Signature([name]) Signed: 05/08/2023 4:13:11 PM By: Duanne Guess MD FACS Previous Signature: 05/08/2023 4:04:22 PM Version By: Duanne Guess MD FACS Entered By: Duanne Guess on 05/08/2023 16:13:11 [name] ([id number]) [id number] Page 10 of 12 -------------------------------------------------------------------------------- HxROS Details Patient Name: Date of Service: [name], [name] 05/08/2023 2:30 PM Medical Record Number: [id number] Patient Account Number: [id number] Date of Birth/Sex: Treating RN: [birth year] (76 y.o. F) Primary Care Provider: Eustaquio Boyden Other Clinician: Referring Provider: Treating Provider/Extender: Priscille Loveless in Treatment: 40 Information Obtained From Patient Eyes Medical History: Positive for: Cataracts - L eye Ear/Nose/Mouth/Throat Medical History: Negative for: Chronic sinus problems/congestion; Middle ear problems Hematologic/Lymphatic Medical History: Negative for: Anemia; Human Immunodeficiency Virus; Lymphedema Respiratory Medical History: Positive for: Asthma Past Medical History Notes: OSA on CPAP Cardiovascular Medical History: Positive for: Hypertension Past Medical History Notes: Cronic venous insufficiency 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 Gastrointestinal Medical History: Past Medical History Notes: liver cyst Endocrine Medical History: Negative for: Type I Diabetes; Type II Diabetes Musculoskeletal Medical History: Past Medical History  Notes: arthritis Neurologic Medical History: Positive for: Neuropathy - Feet and finger  Negative for: Seizure Disorder Past Medical History Notes: restless leg syndrome Oncologic Medical History: Positive for: Received Chemotherapy - 2012; Received Radiation - 2012 SIBEL, KHURANA ([id number]) [id number] Page 11 of 12 HBO Extended History Items Eyes: Cataracts Immunizations Pneumococcal Vaccine: Received Pneumococcal Vaccination: Yes Received Pneumococcal Vaccination On or After 60th Birthday: Yes Implantable Devices No devices added Hospitalization / Surgery History Type of Hospitalization/Surgery Lower extremity venography 2020 Intravascular ultrasound peripheral vascular intervention melanoma 2011 Family and Social History Cancer: Yes - Mother,Paternal Grandparents; Diabetes: Yes - Siblings; Heart Disease: Yes - Father; Hypertension: Yes - Father; Former smoker - quit in [employer]) Signed: 05/08/2023 4:41:42 PM By: Duanne Guess MD FACS Entered By: Duanne Guess on 05/08/2023 16:00:45 -------------------------------------------------------------------------------- SuperBill Details Patient Name: Date of Service: KARIYAH, BAUGH 05/08/2023 Medical Record Number: [id number] Patient Account Number: [id number] Date of Birth/Sex: Treating RN: 05-25-1946 (77 y.o. [name] Primary Care Provider: Eustaquio Boyden Other Clinician: Referring Provider: Treating Provider/Extender: Priscille Loveless in Treatment: 40 Diagnosis Coding ICD-10 Codes Code Description [phone] Non-pressure chronic ulcer of other part of left lower leg with fat layer exposed I83.893 Varicose veins of bilateral lower extremities with other complications I87.2 Venous insufficiency (chronic) (peripheral) G62.9 Polyneuropathy, unspecified R60.0 Localized edema Facility Procedures : CPT4 Code: [id number] Description: 97597 -  DEBRIDE WOUND 1ST 20 SQ CM OR < ICD-10 Diagnosis Description L97.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 Modifier: d Quantity: 1 Physician Procedures : CPT4 Code Description Modifier [id number] - WC PHYS LEVEL 4 - EST PT 25 ICD-10 Diagnosis Description L97.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d I87.2 Venous insufficiency (chronic) (peripheral) B28.413  Varicose veins of bilateral lower extremities with other complications TYRONZA, HAPPE ([id number]) [id number]. G62.9 Polyneuropathy, unspecified Quantity: 1 pdf Page 12 of 12 : [id number] 97597 - WC PHYS DEBR WO ANESTH 20 SQ CM 1 ICD-10 Diagnosis Description L97.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d Quantity: Electronic Signature([name]) Signed: 05/08/2023 4:15:32 PM By: Duanne Guess MD FACS Entered By: Duanne Guess on 05/08/2023 16:15:32

## 2023-05-29 ENCOUNTER — Encounter (HOSPITAL_BASED_OUTPATIENT_CLINIC_OR_DEPARTMENT_OTHER): Payer: Medicare Other | Admitting: General Surgery

## 2023-05-29 DIAGNOSIS — L97222 Non-pressure chronic ulcer of left calf with fat layer exposed: Secondary | ICD-10-CM | POA: Diagnosis not present

## 2023-05-29 DIAGNOSIS — I871 Compression of vein: Secondary | ICD-10-CM | POA: Diagnosis not present

## 2023-05-29 DIAGNOSIS — G629 Polyneuropathy, unspecified: Secondary | ICD-10-CM | POA: Diagnosis not present

## 2023-05-29 DIAGNOSIS — J45909 Unspecified asthma, uncomplicated: Secondary | ICD-10-CM | POA: Diagnosis not present

## 2023-05-29 DIAGNOSIS — G4733 Obstructive sleep apnea (adult) (pediatric): Secondary | ICD-10-CM | POA: Diagnosis not present

## 2023-05-29 DIAGNOSIS — I872 Venous insufficiency (chronic) (peripheral): Secondary | ICD-10-CM | POA: Diagnosis not present

## 2023-05-29 DIAGNOSIS — L97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d: Secondary | ICD-10-CM | POA: Diagnosis not present

## 2023-05-29 DIAGNOSIS — I10 Essential (primary) hypertension: Secondary | ICD-10-CM | POA: Diagnosis not present

## 2023-05-29 NOTE — Progress Notes (Signed)
[name], [name] ([id number]) [id number] Page 1 of 12 Visit Report for 05/22/2023 Chief Complaint Document Details Patient Name: Date of Service: Amber, [name] 05/22/2023 2:15 PM Medical Record Number: [id number] Patient Account Number: [id number] Date of Birth/Sex: Treating RN: 1946/09/08 (77 y.o. F) Primary Care Provider: Eustaquio Boyden Other Clinician: Referring Provider: Treating Provider/Extender: Priscille Loveless in Treatment: 42 Information Obtained from: Patient Chief Complaint Patient presents for treatment of an open ulcer due to venous insufficiency Electronic Signature(s) Signed: 05/22/2023 3:38:29 PM By: Duanne Guess MD FACS Entered By: Duanne Guess on 05/22/2023 15:38:29 -------------------------------------------------------------------------------- Debridement Details Patient Name: Date of Service: [name] 05/22/2023 2:15 PM Medical Record Number: [id number] Patient Account Number: [id number] Date of Birth/Sex: Treating RN: July 29, 1946 (77 y.o. [name] Primary Care Provider: Eustaquio Boyden Other Clinician: Referring Provider: Treating Provider/Extender: Priscille Loveless in Treatment: 42 Debridement Performed for Assessment: Wound #2 Left,Posterior Lower Leg Performed By: Physician Duanne Guess, MD Debridement Type: Debridement Severity of Tissue Pre Debridement: Fat layer exposed Level of Consciousness (Pre-procedure): Awake and Alert Pre-procedure Verification/Time Out Yes - 15:25 Taken: Start Time: 15:30 Pain Control: Lidocaine 4% T opical Solution Percent of Wound Bed Debrided: 100% T Area Debrided (cm): otal 6.86 Tissue and other material debrided: Viable, Non-Viable, Slough, Slough Level: Non-Viable Tissue Debridement Description: Selective/Open Wound Instrument: Curette Bleeding: Minimum Hemostasis Achieved: Pressure End Time: 15:35 Procedural  Pain: 0 Post Procedural Pain: 0 Response to Treatment: Procedure was tolerated well Level of Consciousness (Post- Awake and Alert procedure): Post Debridement Measurements of Total Wound Length: (cm) 3.8 Width: (cm) 2.3 Depth: (cm) 0.2 Volume: (cm) 1.373 Character of Wound/Ulcer Post Debridement: Improved [name], [name] ([id number]) [id number] Page 2 of 12 Severity of Tissue Post Debridement: Fat layer exposed Post Procedure Diagnosis Same as Pre-procedure Notes Scribed for Dr Lady Gary by Brenton Grills RN Electronic Signature(s) Signed: 05/22/2023 5:24:07 PM By: Duanne Guess MD FACS Signed: 05/29/2023 11:11:00 AM By: Brenton Grills Entered By: Brenton Grills on 05/22/2023 15:27:17 -------------------------------------------------------------------------------- HPI Details Patient Name: Date of Service: Amber, [name] 05/22/2023 2:15 PM Medical Record Number: [id number] Patient Account Number: [id number] Date of Birth/Sex: Treating RN: 02/02/1946 (77 y.o. F) Primary Care Provider: Eustaquio Boyden Other Clinician: Referring Provider: Treating Provider/Extender: Priscille Loveless in Treatment: 42 History of Present Illness HPI Description: ADMISSION This is a 77 year old woman with a history of chronic venous insufficiency status post saphenous vein ablations in 2010 and 2016. 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 She presents today with an open venous ulcer on her left lower leg. It has been present for a little over 2 weeks. She saw her primary care provider who apparently swabbed the wound and grew out Pseudomonas. She just completed a course of ciprofloxacin for this. ABI was 0.91. She reports that she has had previous issues with ulcers in this same location, stemming back to a punch biopsy taken by a dermatologist many years ago. She has had several skin substitutes applied to the area that  have ultimately resulted in healing on prior occasions. She has 2 small ulcers on her left medial lower leg. There is slough accumulation in both of them. The more anterior of the 2 is quite small and has some soft slough on the surface, underneath which there is good granulation tissue. The more medial wound also has slough accumulation, but the underlying surface is fairly fibrotic and gritty. This is consistent with her provided history of multiple ulcers  in the same location. 08/03/2022: The anterior wound is smaller today with just a little bit of slough accumulation. The more medial wound continues to be very sensitive and fibrotic with slough buildup. 08/13/2022: The anterior wound is closed. The more medial wound remains sensitive with a fairly fibrotic surface. There is more granulation tissue filling in, however, and there is less slough than on prior occasions. 08/20/2022: The anterior wound remains closed. The more medial wound has filled with granulation tissue but still has a fair amount of slough on the surface and remains fairly tender. Unfortunately, she has developed 2 small ulcers just proximal to this. The fat layer is exposed in each. She says that over the weekend, she felt a burning sensation in that location. 08/27/2022: The 2 small wounds proximal to the main wound have merged into a single site. The wounds look a little bit dry, but they are quite clean without significant slough accumulation. They remain quite tender. 09/03/2022: All of the wounds have now merged into 1 large wound. There is a strong odor coming from the wound and the surface is not particularly viable. It is extremely painful for her today. 09/10/2022: The wound is less black and purple this week but still does not look particularly viable. The surface is desiccated. The culture that I took last week returned with a polymicrobial population including Pseudomonas. I prescribed both Augmentin and levofloxacin.  She has not yet picked up levofloxacin, as her pharmacy only notified her yesterday that it was ready. We have ordered a Keystone topical compounded antibiotic, but it has not yet come. 09/17/2022: The wound surface is markedly improved today. There is still an area of grayish-looking muscle but the rest appears significantly more viable. There is still a layer of slough on the surface. She still has a couple days left of oral antibiotic therapy. She has her Jodie Echevaria compounded topical antibiotic with her today. 09/24/2022: She has completed her oral antibiotic therapy. The wound surface is much cleaner today and more viable without any necrotic tissue. It is a bit desiccated, however. 10/01/2022: The moisture balance of the wound has improved. There is a layer of yellow slough on the surface, but beneath this, the wound is more pink. 10/08/2022: The wound is smaller and cleaner today with a layer of slough present. She is having less pain. 12/18; this patient has a new wound on the right lateral ankle which apparently started after a cat scratched her. Secondarily she managed to drop a cake pan on the same area. This is very painful. The original wound was on the left posterior calf we have been using [employer] under an [employer]. The Unna boot fell down and apparently she was in for a nurse visit perhaps last week and that 1 fell down as well This patient has central venous mediated hypertension. For member correctly she has a stent placed in the left common iliac artery by Dr. Randie Heinz some years ago [facility] Thurner syndrome] she also has had venous ablations and I believe a history of a DVT [name], [name] ([id number]) [id number] Page 3 of 12 The patient has compression pumps at home but she does not use them 10/30/2022: The patient says that her wounds burned all week with the [facility] classic in place. She has a fair amount of slough and nonviable subcutaneous  tissue present on the right ankle. The left posterior calf wound is cleaner than I have seen it to date. Both remain fairly tender. 11/14/2022:  Both wounds have substantial slough and eschar accumulation. They remain extremely tender. 11/21/2022: The wounds are cleaner this week, but still have slough and eschar accumulation. She continues to describe burning pain in both wounds, but upon further questioning, she actually has burning in both of her feet that radiates up her legs and includes to the wounds. 11/28/2022: Both wounds have slough and eschar accumulation, as well as adherent silver alginate that was difficult to remove. She continues to have pain out of proportion to the extent of her wounds. Her PCP did initiate Lyrica which she started taking last night. The dose is quite low, only 25 mg, but apparently there is a plan for upward titration, assuming she tolerates the medication. 12/05/2022: The wounds are both a little bit smaller today, but have significant slough accumulation, as usual. They remain exquisitely tender. She is now taking Lyrica twice a day. 12/19/2022: Both of her wounds look significantly better this week. There is slough buildup on both, but they are smaller. She seems to be getting a good response from Lyrica as she is having much less pain. 12/26/2022: The wound on her right lateral ankle is nearly closed. The wound on her left posterior calf is smaller but still has a lot of slough accumulation. They remain tender. 01/02/2023: The right lateral ankle wound is down to just a couple of millimeters. It is much less tender than on prior occasions. There is minimal slough buildup. The wound on her posterior calf continues to contract, as well. It has thick slough buildup and remains fairly tender. 01/09/2023: The right lateral ankle wound is nearly closed. He remains tender. There is just a little eschar on the surface. The wound on her posterior calf has improved quite a bit.  There is still slough on the surface, but the more proximal area has epithelialized substantially and there is no longer any gray discoloration to her tissue. 3/13; patient presents for follow-up. Her right lateral ankle wound is almost healed. She has a wound to her left posterior calf with slough and granulation tissue. We have been using Santyl under Unna boots bilaterally to the lower extremities. She has no issues or complaints today. 01/30/2023: The right lateral ankle wound is down to just a pinhole under a layer of eschar. The left posterior calf wound is quite a bit improved since the last time I saw it. There is still a layer of slough on the surface but there has been more epithelialization. 02/07/2023: The right lateral ankle wound still has some eschar on the surface. The left posterior calf wound is about the same size, but with more areas of epithelialization. 02/20/2023: The right lateral ankle wound is healed. The left posterior calf wound is smaller and is essentially flush with the surrounding skin, but the surface is quite dry. Her leg wrap slipped and her calf is quite swollen above the level of the wound. 03/01/2023: The right lateral ankle wound reopened and she also suffered a fall that resulted in a small wound on her right anterior tibial surface. The left posterior calf wound has a much better moisture balance today. There is a layer of slough on the surface. The [employer] worked much better for her as far as compression this week. 03/06/2023: The right lateral ankle wound has a layer of eschar on the surface and is too painful for me to debride. The anterior tibial surface wound is more or less healed; it is a very superficial abrasion at this point.  The left posterior calf wound has a layer of slough on the surface. Her edema control is better. 03/13/2023: The right lateral ankle wound has a layer of eschar on the surface. Once this was debrided, it was found to be healed. The  anterior tibial surface wound has a layer of stuck silver alginate on the surface. This was removed to reveal a very superficial abrasion but not much change from last week. The left posterior calf wound is smaller and has its usual layer of accumulated slough. 03/25/2023: The anterior tibial surface wound has eschar and silver alginate stuck to it. Once this was removed, the wound was found to be healed. The left posterior calf wound is roughly the same size and has a little bit thicker layer of accumulated slough. 04/03/2023: She has had more drainage from the posterior calf wound over the last week and the layer of slough is thicker. She also says it is more tender. 04/10/2023: Continued increase in drainage from her wound. The wound remains tender with a layer of slough on the surface. It does not appear as viable this week. She also has erythema, swelling, and warmth in her entire left lower leg extending into the midfoot. 04/17/2023: The wound is about the same size this week. There is fibrotic adherent slough, but the drainage is not as profuse. She is currently taking levofloxacin. She is complaining of an increase in the burning sensation at her wound. 04/24/2023: The wound measured smaller this week and is much cleaner-looking. She completed her course of levofloxacin. Her PCP increased her dose of Lyrica and she reports that the burning has stopped. 05/01/2023: The wound is smaller again today and quite clean with only minimal slough on the surface. 05/08/2023: The wound is smaller again today. It is fairly clean, but the surface is a bit fibrotic. Moisture balance is better. Her leg is more swollen and erythematous today, however. 05/15/2023: The wound is about the same size today. The surface has very little slough accumulation, but remains fibrotic. Moisture balance is good. She is responding nicely to the oral Keflex she is taking and her leg is less erythematous and edematous. 05/22/2023: The  wound is unchanged in size. There is quite a bit of slough on the surface but the moisture balance is better. It is still fibrotic, but a little bit less so. Her leg is no longer erythematous, but she does have some persistent edema. Electronic Signature(s) Signed: 05/22/2023 3:39:14 PM By: Duanne Guess MD FACS Entered By: Duanne Guess on 05/22/2023 15:39:14 [name] ([id number]) [id number] Page 4 of 12 -------------------------------------------------------------------------------- Physical Exam Details Patient Name: Date of Service: [name], [name] 05/22/2023 2:15 PM Medical Record Number: [id number] Patient Account Number: [id number] Date of Birth/Sex: Treating RN: [birth year] (77 y.o. F) Primary Care Provider: Eustaquio Boyden Other Clinician: Referring Provider: Treating Provider/Extender: Priscille Loveless in Treatment: 42 Constitutional Hypertensive, asymptomatic. . . . no acute distress. Respiratory Normal work of breathing on room air. Notes 05/22/2023: The wound is unchanged in size. There is quite a bit of slough on the surface but the moisture balance is better. It is still fibrotic, but a little bit less so. Her leg is no longer erythematous, but she does have some persistent edema. Electronic Signature(s) Signed: 05/22/2023 3:41:22 PM By: Duanne Guess MD FACS Entered By: Duanne Guess on 05/22/2023 15:41:22 -------------------------------------------------------------------------------- Physician Orders Details Patient Name: Date of Service: Groninger, Samira J. 05/22/2023 2:15 PM Medical Record Number: [id number] Patient Account Number: [id number]  Date of Birth/Sex: Treating RN: 01/08/1946 (77 y.o. [name] Primary Care Provider: Eustaquio Boyden Other Clinician: Referring Provider: Treating Provider/Extender: Priscille Loveless in Treatment: 71 Verbal / Phone Orders:  No Diagnosis Coding ICD-10 Coding Code Description [phone] Non-pressure chronic ulcer of other part of left lower leg with fat layer exposed I83.893 Varicose veins of bilateral lower extremities with other complications I87.2 Venous insufficiency (chronic) (peripheral) G62.9 Polyneuropathy, unspecified R60.0 Localized edema Follow-up Appointments ppointment in 1 week. - Dr. Lady Gary Room 3 Return A 05/22/23 NOTE - Switch from HFBR to Endoform starting next visit. Anesthetic Wound #2 Left,Posterior Lower Leg (In clinic) Topical Lidocaine 4% applied to wound bed Bathing/ Shower/ Hygiene May shower with protection but do not get wound dressing(s) wet. Protect dressing(s) with water repellant cover (for example, large plastic bag) or a cast cover and may then take shower. Edema Control - Lymphedema / SCD / Other Left Lower Extremity Lymphedema Pumps. Use Lymphedema pumps on leg(s) 2-3 times a day for 45-60 minutes. If wearing any wraps or hose, do not remove [name], [name] ([id number]) [id number] Page 5 of 12 them. Continue exercising as instructed. - Use x1 per day Avoid standing for long periods of time. Exercise regularly Off-Loading Wound #2 Left,Posterior Lower Leg Other: - Elevate legs at heart level or above heart level while sitting. Wound Treatment Wound #2 - Lower Leg Wound Laterality: Left, Posterior Cleanser: Soap and Water 1 x Per Week/30 Days Discharge Instructions: May shower and wash wound with dial antibacterial soap and water prior to dressing change. Peri-Wound Care: Sween Lotion (Moisturizing lotion) 1 x Per Week/30 Days Discharge Instructions: Apply moisturizing lotion as directed Topical: Gentamicin 1 x Per Week/30 Days Discharge Instructions: As directed by physician Topical: Mupirocin Ointment 1 x Per Week/30 Days Discharge Instructions: Apply Mupirocin (Bactroban) as instructed Topical: Skintegrity Hydrogel 4 (oz) 1 x Per Week/30  Days Discharge Instructions: Apply hydrogel as directed Prim Dressing: Hydrofera Blue Ready Transfer Foam, 2.5x2.5 (in/in) 1 x Per Week/30 Days ary Discharge Instructions: Apply directly to wound bed as directed Secondary Dressing: Zetuvit Plus 4x8 in 1 x Per Week/30 Days Discharge Instructions: Apply over primary dressing as directed. Secured With: 52M Medipore Scientist, research (life sciences) Surgical T 2x10 (in/yd) 1 x Per Week/30 Days ape Discharge Instructions: Secure with tape as directed. Compression Wrap: Unnaboot w/Calamine, 4x10 (in/yd) 1 x Per Week/30 Days Discharge Instructions: Apply Unnaboot as directed. Electronic Signature(s) Signed: 05/22/2023 5:24:07 PM By: Duanne Guess MD FACS Signed: 05/29/2023 11:11:00 AM By: Brenton Grills Entered By: Brenton Grills on 05/22/2023 15:46:30 -------------------------------------------------------------------------------- Problem List Details Patient Name: Date of Service: [name], [name] 05/22/2023 2:15 PM Medical Record Number: [id number] Patient Account Number: [id number] Date of Birth/Sex: Treating RN: [birth year] (77 y.o. [name] Primary Care Provider: Eustaquio Boyden Other Clinician: Referring Provider: Treating Provider/Extender: Priscille Loveless in Treatment: 80 Active Problems ICD-10 Encounter Code Description Active Date MDM Diagnosis [phone] Non-pressure chronic ulcer of other part of left lower leg with fat layer exposed 07/27/2022 No Yes I83.893 Varicose veins of bilateral lower extremities with other complications 07/27/2022 No Yes I87.2 Venous insufficiency (chronic) (peripheral) 07/27/2022 No Yes LESTER, PLATAS ([id number]) [id number] Page 6 of 12 G62.9 Polyneuropathy, unspecified 07/27/2022 No Yes R60.0 Localized edema 07/27/2022 No Yes Inactive Problems ICD-10 Code Description Active Date Inactive Date L03.115 Cellulitis of right lower limb 10/22/2022 10/22/2022 L97.818  Non-pressure chronic ulcer of other part of right lower leg with other specified severity 03/01/2023 10/22/2022 Resolved Problems  Electronic Signature(s) Signed: 05/22/2023 3:38:17 PM By: Duanne Guess MD FACS Entered By: Duanne Guess on 05/22/2023 15:38:17 -------------------------------------------------------------------------------- Progress Note Details Patient Name: Date of Service: [name], [name] 05/22/2023 2:15 PM Medical Record Number: [id number] Patient Account Number: [id number] Date of Birth/Sex: Treating RN: [birth year] (77 y.o. F) Primary Care Provider: Eustaquio Boyden Other Clinician: Referring Provider: Treating Provider/Extender: Priscille Loveless in Treatment: 42 Subjective Chief Complaint Information obtained from Patient Patient presents for treatment of an open ulcer due to venous insufficiency History of Present Illness (HPI) ADMISSION This is a 77 year old woman with a history of chronic venous insufficiency status post saphenous vein ablations in 2010 and 2016. 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 She presents today with an open venous ulcer on her left lower leg. It has been present for a little over 2 weeks. She saw her primary care provider who apparently swabbed the wound and grew out Pseudomonas. She just completed a course of ciprofloxacin for this. ABI was 0.91. She reports that she has had previous issues with ulcers in this same location, stemming back to a punch biopsy taken by a dermatologist many years ago. She has had several skin substitutes applied to the area that have ultimately resulted in healing on prior occasions. She has 2 small ulcers on her left medial lower leg. There is slough accumulation in both of them. The more anterior of the 2 is quite small and has some soft slough on the surface, underneath which there is good granulation tissue. The more medial wound also has slough  accumulation, but the underlying surface is fairly fibrotic and gritty. This is consistent with her provided history of multiple ulcers in the same location. 08/03/2022: The anterior wound is smaller today with just a little bit of slough accumulation. The more medial wound continues to be very sensitive and fibrotic with slough buildup. 08/13/2022: The anterior wound is closed. The more medial wound remains sensitive with a fairly fibrotic surface. There is more granulation tissue filling in, however, and there is less slough than on prior occasions. 08/20/2022: The anterior wound remains closed. The more medial wound has filled with granulation tissue but still has a fair amount of slough on the surface and remains fairly tender. Unfortunately, she has developed 2 small ulcers just proximal to this. The fat layer is exposed in each. She says that over the weekend, she felt a burning sensation in that location. 08/27/2022: The 2 small wounds proximal to the main wound have merged into a single site. The wounds look a little bit dry, but they are quite clean without significant slough accumulation. They remain quite tender. 09/03/2022: All of the wounds have now merged into 1 large wound. There is a strong odor coming from the wound and the surface is not particularly viable. It is extremely painful for her today. [name], [name] ([id number]) [id number] Page 7 of 12 09/10/2022: The wound is less black and purple this week but still does not look particularly viable. The surface is desiccated. The culture that I took last week returned with a polymicrobial population including Pseudomonas. I prescribed both Augmentin and levofloxacin. She has not yet picked up levofloxacin, as her pharmacy only notified her yesterday that it was ready. We have ordered a Keystone topical compounded antibiotic, but it has not yet come. 09/17/2022: The wound surface is markedly improved today.  There is still an area of grayish-looking muscle but the rest appears significantly more viable. There  is still a layer of slough on the surface. She still has a couple days left of oral antibiotic therapy. She has her Jodie Echevaria compounded topical antibiotic with her today. 09/24/2022: She has completed her oral antibiotic therapy. The wound surface is much cleaner today and more viable without any necrotic tissue. It is a bit desiccated, however. 10/01/2022: The moisture balance of the wound has improved. There is a layer of yellow slough on the surface, but beneath this, the wound is more pink. 10/08/2022: The wound is smaller and cleaner today with a layer of slough present. She is having less pain. 12/18; this patient has a new wound on the right lateral ankle which apparently started after a cat scratched her. Secondarily she managed to drop a cake pan on the same area. This is very painful. The original wound was on the left posterior calf we have been using [employer] under an [employer]. The Unna boot fell down and apparently she was in for a nurse visit perhaps last week and that 1 fell down as well This patient has central venous mediated hypertension. For member correctly she has a stent placed in the left common iliac artery by Dr. 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 She has a fair amount of slough and nonviable subcutaneous tissue present on the right ankle. The left posterior calf wound is cleaner than I have seen it to date. Both remain fairly tender. 11/14/2022: Both wounds have substantial slough and eschar accumulation. They remain extremely tender. 11/21/2022: The wounds are cleaner this week, but still have slough and eschar accumulation.  She continues to describe burning pain in both wounds, but upon further questioning, she actually has burning in both of her feet that radiates up her legs and includes to the wounds. 11/28/2022: Both wounds have slough and eschar accumulation, as well as adherent silver alginate that was difficult to remove. She continues to have pain out of proportion to the extent of her wounds. Her PCP did initiate Lyrica which she started taking last night. The dose is quite low, only 25 mg, but apparently there is a plan for upward titration, assuming she tolerates the medication. 12/05/2022: The wounds are both a little bit smaller today, but have significant slough accumulation, as usual. They remain exquisitely tender. She is now taking Lyrica twice a day. 12/19/2022: Both of her wounds look significantly better this week. There is slough buildup on both, but they are smaller. She seems to be getting a good response from Lyrica as she is having much less pain. 12/26/2022: The wound on her right lateral ankle is nearly closed. The wound on her left posterior calf is smaller but still has a lot of slough accumulation. They remain tender. 01/02/2023: The right lateral ankle wound is down to just a couple of millimeters. It is much less tender than on prior occasions. There is minimal slough buildup. The wound on her posterior calf continues to contract, as well. It has thick slough buildup and remains fairly tender. 01/09/2023: The right lateral ankle wound is nearly closed. He remains tender. There is just a little eschar on the surface. The wound on her posterior calf has improved quite a bit. There is still slough on the  surface, but the more proximal area has epithelialized substantially and there is no longer any gray discoloration to her tissue. 3/13; patient presents for follow-up. Her right lateral ankle wound is almost healed. She has a wound to her left posterior calf with slough and granulation tissue. We  have been using Santyl under Unna boots bilaterally to the lower extremities. She has no issues or complaints today. 01/30/2023: The right lateral ankle wound is down to just a pinhole under a layer of eschar. The left posterior calf wound is quite a bit improved since the last time I saw it. There is still a layer of slough on the surface but there has been more epithelialization. 02/07/2023: The right lateral ankle wound still has some eschar on the surface. The left posterior calf wound is about the same size, but with more areas of epithelialization. 02/20/2023: The right lateral ankle wound is healed. The left posterior calf wound is smaller and is essentially flush with the surrounding skin, but the surface is quite dry. Her leg wrap slipped and her calf is quite swollen above the level of the wound. 03/01/2023: The right lateral ankle wound reopened and she also suffered a fall that resulted in a small wound on her right anterior tibial surface. The left posterior calf wound has a much better moisture balance today. There is a layer of slough on the surface. The [employer] worked much better for her as far as compression this week. 03/06/2023: The right lateral ankle wound has a layer of eschar on the surface and is too painful for me to debride. The anterior tibial surface wound is more or less healed; it is a very superficial abrasion at this point. The left posterior calf wound has a layer of slough on the surface. Her edema control is better. 03/13/2023: The right lateral ankle wound has a layer of eschar on the surface. Once this was debrided, it was found to be healed. The anterior tibial surface wound has a layer of stuck silver alginate on the surface. This was removed to reveal a very superficial abrasion but not much change from last week. The left posterior calf wound is smaller and has its usual layer of accumulated slough. 03/25/2023: The anterior tibial surface wound has eschar and silver  alginate stuck to it. Once this was removed, the wound was found to be healed. The left posterior calf wound is roughly the same size and has a little bit thicker layer of accumulated slough. 04/03/2023: She has had more drainage from the posterior calf wound over the last week and the layer of slough is thicker. She also says it is more tender. 04/10/2023: Continued increase in drainage from her wound. The wound remains tender with a layer of slough on the surface. It does not appear as viable this week. She also has erythema, swelling, and warmth in her entire left lower leg extending into the midfoot. 04/17/2023: The wound is about the same size this week. There is fibrotic adherent slough, but the drainage is not as profuse. She is currently taking levofloxacin. She is complaining of an increase in the burning sensation at her wound. 04/24/2023: The wound measured smaller this week and is much cleaner-looking. She completed her course of levofloxacin. Her PCP increased her dose of Lyrica and she reports that the burning has stopped. [name], [name] ([id number]) [id number] Page 8 of 12 05/01/2023: The wound is smaller again today and quite clean with only minimal slough on the surface. 05/08/2023:  The wound is smaller again today. It is fairly clean, but the surface is a bit fibrotic. Moisture balance is better. Her leg is more swollen and erythematous today, however. 05/15/2023: The wound is about the same size today. The surface has very little slough accumulation, but remains fibrotic. Moisture balance is good. She is responding nicely to the oral Keflex she is taking and her leg is less erythematous and edematous. 05/22/2023: The wound is unchanged in size. There is quite a bit of slough on the surface but the moisture balance is better. It is still fibrotic, but a little bit less so. Her leg is no longer erythematous, but she does have some persistent edema. Patient  History Information obtained from Patient. Family History Cancer - Mother,Paternal Grandparents, Diabetes - Siblings, Heart Disease - Father, Hypertension - Father. Social History Former smoker - quit in 1967. Medical History Eyes Patient has history of Cataracts - L eye Ear/Nose/Mouth/Throat Denies history of Chronic sinus problems/congestion, Middle ear problems Hematologic/Lymphatic Denies history of Anemia, Human Immunodeficiency Virus, Lymphedema Respiratory Patient has history of Asthma Cardiovascular Patient has history of Hypertension Endocrine Denies history of Type I Diabetes, Type II Diabetes Neurologic Patient has history of Neuropathy - Feet and finger Denies history of Seizure Disorder Oncologic Patient has history of Received Chemotherapy - 2012, Received Radiation - 2012 Hospitalization/Surgery History - Lower extremity venography 2020. - Intravascular ultrasound. - peripheral vascular intervention. - melanoma 2011. 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 no acute distress. Vitals Time Taken: 2:58 AM, Height: 63 in, Weight: 200 lbs, BMI: 35.4, Temperature: 98.6 F, Pulse: 78 bpm, Respiratory Rate: 18 breaths/min, Blood Pressure: 151/79 mmHg. Respiratory Normal work of breathing on room air. General Notes: 05/22/2023: The wound is unchanged in size. There is quite a bit of slough on the surface but the moisture balance is better. It is still fibrotic, but a little bit less so. Her leg is no longer erythematous, but she does have some persistent edema. Integumentary (Hair, Skin) Wound #2 status is Open. Original cause of wound was Blister. The date acquired was: 07/06/2022. The wound has been in  treatment 42 weeks. The wound is located on the Left,Posterior Lower Leg. The wound measures 3.8cm length x 2.3cm width x 0.2cm depth; 6.864cm^2 area and 1.373cm^3 volume. There is Fat Layer (Subcutaneous Tissue) exposed. There is no tunneling or undermining noted. There is a medium amount of serosanguineous drainage noted. The wound margin is distinct with the outline attached to the wound base. There is medium (34-66%) red granulation within the wound bed. There is a medium (34-66%) amount of necrotic tissue within the wound bed including Eschar and Adherent Slough. The periwound skin appearance had no abnormalities noted for texture. The periwound skin appearance exhibited: Maceration, Rubor. The periwound skin appearance did not exhibit: Dry/Scaly. Periwound temperature was noted as No Abnormality. The periwound has tenderness on palpation. [name], [name] ([id number]) [id number] Page 9 of 12 Assessment Active Problems ICD-10 Non-pressure chronic ulcer of other part of left lower leg with fat layer exposed Varicose veins of bilateral lower extremities with other complications Venous insufficiency (chronic) (peripheral) Polyneuropathy, unspecified Localized edema Procedures Wound #2 Pre-procedure diagnosis of Wound #2 is a Venous Leg Ulcer located on the Left,Posterior Lower Leg .Severity of Tissue Pre Debridement is: Fat layer exposed. There was a Selective/Open Wound Non-Viable  Tissue Debridement with a total area of 6.86 sq cm performed by Duanne Guess, MD. With the following instrument(s): Curette to remove Viable and Non-Viable tissue/material. Material removed includes [name] after achieving pain control using Lidocaine 4% T opical Solution. No specimens were taken. A time out was conducted at 15:25, prior to the start of the procedure. A Minimum amount of bleeding was controlled with Pressure. The procedure was tolerated well with a pain level of 0  throughout and a pain level of 0 following the procedure. Post Debridement Measurements: 3.8cm length x 2.3cm width x 0.2cm depth; 1.373cm^3 volume. Character of Wound/Ulcer Post Debridement is improved. Severity of Tissue Post Debridement is: Fat layer exposed. Post procedure Diagnosis Wound #2: Same as Pre-Procedure General Notes: Scribed for Dr Lady Gary by Brenton Grills RN. Plan Follow-up Appointments: Return Appointment in 1 week. - Dr. Lady Gary Room 3 Anesthetic: Wound #2 Left,Posterior Lower Leg: (In clinic) Topical Lidocaine 4% applied to wound bed Bathing/ Shower/ Hygiene: May shower with protection but do not get wound dressing(s) wet. Protect dressing(s) with water repellant cover (for example, large plastic bag) or a cast cover and may then take shower. Edema Control - Lymphedema / SCD / Other: Lymphedema Pumps. Use Lymphedema pumps on leg(s) 2-3 times a day for 45-60 minutes. If wearing any wraps or hose, do not remove them. Continue exercising as instructed. - Use x1 per day Avoid standing for long periods of time. Exercise regularly Off-Loading: Wound #2 Left,Posterior Lower Leg: Other: - Elevate legs at heart level or above heart level while sitting. WOUND #2: - Lower Leg Wound Laterality: Left, Posterior Cleanser: Soap and Water 1 x Per Week/30 Days Discharge Instructions: May shower and wash wound with dial antibacterial soap and water prior to dressing change. Peri-Wound Care: Sween Lotion (Moisturizing lotion) 1 x Per Week/30 Days Discharge Instructions: Apply moisturizing lotion as directed Topical: Gentamicin 1 x Per Week/30 Days Discharge Instructions: As directed by physician Topical: Mupirocin Ointment 1 x Per Week/30 Days Discharge Instructions: Apply Mupirocin (Bactroban) as instructed Topical: Skintegrity Hydrogel 4 (oz) 1 x Per Week/30 Days Discharge Instructions: Apply hydrogel as directed Prim Dressing: Endoform 2x2 in 1 x Per Week/30 Days ary Discharge  Instructions: Moisten with saline Secondary Dressing: Zetuvit Plus 4x8 in 1 x Per Week/30 Days Discharge Instructions: Apply over primary dressing as directed. Secured With: 26M Medipore Scientist, research (life sciences) Surgical T 2x10 (in/yd) 1 x Per Week/30 Days ape Discharge Instructions: Secure with tape as directed. Com pression Wrap: Unnaboot w/Calamine, 4x10 (in/yd) 1 x Per Week/30 Days Discharge Instructions: Apply Unnaboot as directed. 05/22/2023: The wound is unchanged in size. There is quite a bit of slough on the surface but the moisture balance is better. It is still fibrotic, but a little bit less so. Her leg is no longer erythematous, but she does have some persistent edema. I used a curette to debride slough from the wound surface. We will continue the mixture of topical gentamicin and mupirocin. I changed her contact layer to endoform last week, but the nurse inadvertently applied the [facility] that we were using previously and already had the leg completely wrapped before the difference was noted. I think it will be acceptable for this coming week to leave it as is rather than completely redo her dressing today. The patient is going to take some extra Lasix to help with her edema. She has instructions from her PCP regarding potassium management when she needs to do this. She will follow-up in 1 week's time.  KATARINA, RIEBE ([id number]) [id number] Page 10 of 12 Electronic Signature(s) Signed: 05/22/2023 3:48:42 PM By: Duanne Guess MD FACS Previous Signature: 05/22/2023 3:45:55 PM Version By: Duanne Guess MD FACS Entered By: Duanne Guess on 05/22/2023 15:48:42 -------------------------------------------------------------------------------- HxROS Details Patient Name: Date of Service: [name] 05/22/2023 2:15 PM Medical Record Number: [id number] Patient Account Number: [id number] Date of Birth/Sex: Treating RN: 1945/11/22 (77 y.o. F) Primary Care  Provider: Eustaquio Boyden Other Clinician: Referring Provider: Treating Provider/Extender: Priscille Loveless in Treatment: 42 Information Obtained From Patient Eyes Medical History: Positive for: Cataracts - L eye Ear/Nose/Mouth/Throat Medical History: Negative for: Chronic sinus problems/congestion; Middle ear problems Hematologic/Lymphatic Medical History: Negative for: Anemia; Human Immunodeficiency Virus; Lymphedema Respiratory Medical History: Positive for: Asthma Past Medical History Notes: OSA on CPAP Cardiovascular Medical History: Positive for: Hypertension Past Medical History Notes: Cronic venous insufficiency 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 Gastrointestinal Medical History: Past Medical History Notes: liver cyst Endocrine Medical History: Negative for: Type I Diabetes; Type II Diabetes Musculoskeletal Medical History: Past Medical History Notes: arthritis Neurologic Medical History: Positive for: Neuropathy - Feet and finger PAW, KARSTENS ([id number]) [id number] Page 11 of 12 Negative for: Seizure Disorder Past Medical History Notes: restless leg syndrome Oncologic Medical History: Positive for: Received Chemotherapy - 2012; Received Radiation - 2012 HBO Extended History Items Eyes: Cataracts Immunizations Pneumococcal Vaccine: Received Pneumococcal Vaccination: Yes Received Pneumococcal Vaccination On or After 60th Birthday: Yes Implantable Devices No devices added Hospitalization / Surgery History Type of Hospitalization/Surgery Lower extremity venography 2020 Intravascular ultrasound peripheral vascular intervention melanoma 2011 Family and Social History Cancer: Yes - Mother,Paternal Grandparents; Diabetes: Yes - Siblings; Heart Disease: Yes - Father; Hypertension: Yes - Father; Former smoker - quit in [employer]) Signed: 05/22/2023  5:24:07 PM By: Duanne Guess MD FACS Entered By: Duanne Guess on 05/22/2023 15:40:56 -------------------------------------------------------------------------------- SuperBill Details Patient Name: Date of Service: ARNESHIA, ADE 05/22/2023 Medical Record Number: [id number] Patient Account Number: [id number] Date of Birth/Sex: Treating RN: April 29, 1946 (77 y.o. [name] Primary Care Provider: Eustaquio Boyden Other Clinician: Referring Provider: Treating Provider/Extender: Priscille Loveless in Treatment: 42 Diagnosis Coding ICD-10 Codes Code Description [phone] Non-pressure chronic ulcer of other part of left lower leg with fat layer exposed I83.893 Varicose veins of bilateral lower extremities with other complications I87.2 Venous insufficiency (chronic) (peripheral) G62.9 Polyneuropathy, unspecified R60.0 Localized edema Facility Procedures Physician Procedures : CPT4 Code Description Modifier [id number] - WC PHYS LEVEL 4 - EST PT 25 ICD-10 Diagnosis Description L97.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d I83.893 Varicose veins of bilateral lower extremities with  other complications R60.0 Localized edema I87.2 Venous insufficiency (chronic) (peripheral) Quantity: 1 : [id number] - WC PHYS DEBR WO ANESTH 20 SQ CM ICD-10 Diagnosis Description L97.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d Quantity: 1 Electronic Signature(s) Signed: 05/22/2023 3:49:47 PM By: Duanne Guess MD FACS Entered By: Duanne Guess on 05/22/2023 15:49:47

## 2023-05-29 NOTE — Progress Notes (Signed)
YOCELIN, VANLUE ([id number]) [id number] Page 1 of 8 Visit Report for 04/24/2023 Arrival Information Details Patient Name: Date of Service: [name], [name] 04/24/2023 2:15 PM Medical Record Number: [id number] Patient Account Number: [id number] Date of Birth/Sex: Treating RN: 1946-03-09 (77 y.o. [name] Primary Care Taina Landry: Eustaquio Boyden Other Clinician: Referring Kathrynn Backstrom: Treating Chaneka Trefz/Extender: Priscille Loveless in Treatment: 73 Visit Information History Since Last Visit Added or deleted any medications: No Patient Arrived: Ambulatory Any new allergies or adverse reactions: No Arrival Time: 14:09 Had a fall or experienced change in No Accompanied By: self activities of daily living that may affect Transfer Assistance: None risk of falls: Patient Identification Verified: Yes Signs or symptoms of abuse/neglect since last visito No Secondary Verification Process Completed: Yes Hospitalized since last visit: No Patient Requires Transmission-Based Precautions: No Implantable device outside of the clinic excluding No Patient Has Alerts: No cellular tissue based products placed in the center since last visit: Has Dressing in Place as Prescribed: Yes Has Compression in Place as Prescribed: Yes Pain Present Now: No Electronic Signature(s) Signed: 04/24/2023 4:31:19 PM By: Samuella Bruin Entered By: Samuella Bruin on 04/24/2023 14:10:04 -------------------------------------------------------------------------------- Complex / Palliative Patient Assessment Details Patient Name: Date of Service: [name], [name] 04/24/2023 2:15 PM Medical Record Number: [id number] Patient Account Number: [id number] Date of Birth/Sex: Treating RN: [birth year] (77 y.o. Arta Silence Primary Care Kaevon [name]: Eustaquio Boyden Other Clinician: Referring Derris Millan: Treating Sharnita Bogucki/Extender: Priscille Loveless in Treatment: 38 Complex Wound Management Criteria Patient has remarkable or complex co-morbidities requiring medications or treatments that extend wound healing times. Examples: Diabetes mellitus with chronic renal failure or end stage renal disease requiring dialysis Advanced or poorly controlled rheumatoid arthritis Diabetes mellitus and end stage chronic obstructive pulmonary disease Active cancer with current chemo- or radiation therapy OSA, HTN, CVD, May-[name] Syndrome, seizures, neuropathy, melanoma Palliative Wound Management Criteria Care Approach Wound Care Plan: Complex Wound Management Electronic Signature(s) Signed: 04/27/2023 4:33:48 PM By: Shawn Stall RN, [name] Signed: 04/29/2023 9:35:48 AM By: Duanne Guess MD FACS Entered By: Shawn Stall on 04/27/2023 16:33:47 Ruggerio, [name] ([id number]) [id number] Page 2 of 8 -------------------------------------------------------------------------------- Compression Therapy Details Patient Name: Date of Service: [name], [name] 04/24/2023 2:15 PM Medical Record Number: [id number] Patient Account Number: [id number] Date of Birth/Sex: Treating RN: 12-22-1945 (77 y.o. [name] Primary Care Omunique Pederson: Eustaquio Boyden Other Clinician: Referring Jaishaun Mcnab: Treating Claus Silvestro/Extender: Priscille Loveless in Treatment: 38 Compression Therapy Performed for Wound Assessment: Wound #2 Left,Posterior Lower Leg Performed By: Clinician Samuella Bruin, RN Compression Type: Double Layer Post Procedure Diagnosis Same as Pre-procedure Electronic Signature(s) Signed: 04/24/2023 4:31:19 PM By: Gelene Mink By: Samuella Bruin on 04/24/2023 14:27:03 -------------------------------------------------------------------------------- Encounter Discharge Information Details Patient Name: Date of Service: [name], [name] 04/24/2023 2:15 PM Medical Record  Number: [id number] Patient Account Number: [id number] Date of Birth/Sex: Treating RN: 1945/12/22 (77 y.o. [name] Primary Care Aurthur Wingerter: Eustaquio Boyden Other Clinician: Referring Aminah Zabawa: Treating Arelyn Gauer/Extender: Priscille Loveless in Treatment: 84 Encounter Discharge Information Items Post Procedure Vitals Discharge Condition: Stable Temperature (F): 98.1 Ambulatory Status: Ambulatory Pulse (bpm): 71 Discharge Destination: Home Respiratory Rate (breaths/min): 18 Transportation: Private Auto Blood Pressure (mmHg): 167/98 Accompanied By: self Schedule Follow-up Appointment: Yes Clinical Summary of Care: Patient Declined Electronic Signature(s) Signed: 04/24/2023 4:31:19 PM By: Samuella Bruin Entered By: Samuella Bruin on 04/24/2023 14:43:05 -------------------------------------------------------------------------------- Lower Extremity Assessment Details Patient Name: Date of Service: [name], [name]. 04/24/2023 2:15 PM Medical Record Number:  [id number] Patient Account Number: [id number] Date of Birth/Sex: Treating RN: Dec 05, 1945 (77 y.o. [name] Primary Care Steadman Prosperi: Eustaquio Boyden Other Clinician: Referring Julane Crock: Treating Merary Garguilo/Extender: Priscille Loveless in Treatment: [address] [place] ([id number]) [id number] Page 3 of 8 Edema Assessment Assessed: [Left: No] [Right: No] Edema: [Left: Ye] [Right: s] Calf Left: Right: Point of Measurement: 31 cm From Medial Instep 44.7 cm Ankle Left: Right: Point of Measurement: 9 cm From Medial Instep 25 cm Vascular Assessment Pulses: Dorsalis Pedis Palpable: [Left:Yes] Electronic Signature(s) Signed: 04/24/2023 4:31:19 PM By: Samuella Bruin Entered By: Samuella Bruin on 04/24/2023 14:17:30 -------------------------------------------------------------------------------- Multi Wound Chart Details Patient  Name: Date of Service: [name] 04/24/2023 2:15 PM Medical Record Number: [id number] Patient Account Number: [id number] Date of Birth/Sex: Treating RN: 25-Nov-1945 (77 y.o. F) Primary Care Bryella Diviney: Eustaquio Boyden Other Clinician: Referring Tymesha Ditmore: Treating Mario Coronado/Extender: Priscille Loveless in Treatment: 38 Vital Signs Height(in): 63 Pulse(bpm): 71 Weight(lbs): 200 Blood Pressure(mmHg): 167/98 Body Mass Index(BMI): 35.4 Temperature(F): 98.1 Respiratory Rate(breaths/min): 18 [2:Photos:] [N/A:N/A] Left, Posterior Lower Leg N/A N/A Wound Location: Blister N/A N/A Wounding Event: Venous Leg Ulcer N/A N/A Primary Etiology: Cataracts, Asthma, Hypertension, N/A N/A Comorbid History: Neuropathy, Received Chemotherapy, Received Radiation 07/06/2022 N/A N/A Date Acquired: 21 N/A N/A Weeks of Treatment: Open N/A N/A Wound Status: No N/A N/A Wound Recurrence: 4.3x2.5x0.2 N/A N/A Measurements L x W x D (cm) 8.443 N/A N/A A (cm) : rea 1.689 N/A N/A Volume (cm) : -22.90% N/A N/A % Reduction in Area: -145.90% N/A N/A % Reduction in Volume: Full Thickness Without Exposed N/A N/A ClassificationSANDIA, [name] ([id number]) [id number] Page 4 of 8 Support Structures Medium N/A N/A Exudate A mount: Serosanguineous N/A N/A Exudate Type: red, brown N/A N/A Exudate Color: Distinct, outline attached N/A N/A Wound Margin: Medium (34-66%) N/A N/A Granulation Amount: Red N/A N/A Granulation Quality: Medium (34-66%) N/A N/A Necrotic Amount: Eschar, Adherent Slough N/A N/A Necrotic Tissue: Fat Layer (Subcutaneous Tissue): Yes N/A N/A Exposed Structures: Fascia: No Tendon: No Muscle: No Joint: No Bone: No Small (1-33%) N/A N/A Epithelialization: Debridement - Selective/Open Wound N/A N/A Debridement: Pre-procedure Verification/Time Out 14:27 N/A N/A Taken: Lidocaine 4% Topical Solution N/A N/A Pain  Control: Slough N/A N/A Tissue Debrided: Non-Viable Tissue N/A N/A Level: 8.44 N/A N/A Debridement A (sq cm): rea Curette N/A N/A Instrument: Minimum N/A N/A Bleeding: Pressure N/A N/A Hemostasis A chieved: Procedure was tolerated well N/A N/A Debridement Treatment Response: 4.3x2.5x0.2 N/A N/A Post Debridement Measurements L x W x D (cm) 0.047 N/A N/A Post Debridement Volume: (cm) Scarring: Yes N/A N/A Periwound Skin Texture: Maceration: Yes N/A N/A Periwound Skin Moisture: Dry/Scaly: No Rubor: Yes N/A N/A Periwound Skin Color: No Abnormality N/A N/A Temperature: Yes N/A N/A Tenderness on Palpation: Compression Therapy N/A N/A Procedures Performed: Debridement Treatment Notes Electronic Signature(s) Signed: 04/24/2023 2:32:24 PM By: Duanne Guess MD FACS Entered By: Duanne Guess on 04/24/2023 14:32:23 -------------------------------------------------------------------------------- Multi-Disciplinary Care Plan Details Patient Name: Date of Service: [name] 04/24/2023 2:15 PM Medical Record Number: [id number] Patient Account Number: [id number] Date of Birth/Sex: Treating RN: October 30, 1946 (77 y.o. [name] Primary Care Alea Ryer: Eustaquio Boyden Other Clinician: Referring Winfield Caba: Treating Caydan Mctavish/Extender: Priscille Loveless in Treatment: 38 Multidisciplinary Care Plan reviewed with physician Active Inactive Venous Leg Ulcer Nursing Diagnoses: Knowledge deficit related to disease process and management Potential for venous Insuffiency (use before diagnosis confirmed) Goals: Patient will maintain optimal edema control Date Initiated: 02/07/2023 Target Resolution Date:  07/05/2023 Goal Status: Active Interventions: [name], [name] ([id number]) [id number] Page 5 of 8 Assess peripheral edema status every visit. Compression as ordered Treatment Activities: Therapeutic compression applied  : 02/07/2023 Notes: Wound/Skin Impairment Nursing Diagnoses: Impaired tissue integrity Goals: Ulcer/skin breakdown will have a volume reduction of 30% by week 4 Date Initiated: 08/03/2022 Date Inactivated: 09/10/2022 Target Resolution Date: 08/31/2022 Unmet Reason: uncontrolled edema, Goal Status: Unmet acute infection Ulcer/skin breakdown will have a volume reduction of 50% by week 8 Date Initiated: 09/10/2022 Target Resolution Date: 07/05/2023 Goal Status: Active Interventions: Assess ulceration(s) every visit Treatment Activities: Skin care regimen initiated : 08/03/2022 Notes: [place] ordered. 10/30 RX Levo started. 09/10/22 Electronic Signature(s) Signed: 04/24/2023 4:31:19 PM By: Samuella Bruin Entered By: Samuella Bruin on 04/24/2023 14:29:30 -------------------------------------------------------------------------------- Pain Assessment Details Patient Name: Date of Service: SHAINNA, FAUX 04/24/2023 2:15 PM Medical Record Number: [id number] Patient Account Number: [id number] Date of Birth/Sex: Treating RN: [birth year] (77 y.o. [name] Primary Care Roseann Kees: Eustaquio Boyden Other Clinician: Referring Saharra Santo: Treating Josceline Chenard/Extender: Priscille Loveless in Treatment: 38 Active Problems Location of Pain Severity and Description of Pain Patient Has Paino No Site Locations Rate the pain. Current Pain Level: 0 Pain Management and Medication Current Pain Management: [name], [name] ([id number]) [id number] Page 6 of 8 Electronic Signature(s) Signed: 04/24/2023 4:31:19 PM By: Gelene Mink By: Samuella Bruin on 04/24/2023 14:13:17 -------------------------------------------------------------------------------- Patient/Caregiver Education Details Patient Name: Date of Service: [name], [name] 6/19/2024andnbsp2:15 PM Medical Record Number: [id number] Patient Account Number:  [id number] Date of Birth/Gender: Treating RN: 12/13/1945 (77 y.o. [name] Primary Care Physician: Eustaquio Boyden Other Clinician: Referring Physician: Treating Physician/Extender: Priscille Loveless in Treatment: 64 Education Assessment Education Provided To: Patient Education Topics Provided Safety: Methods: Explain/Verbal Responses: Reinforcements needed, State content correctly Electronic Signature(s) Signed: 04/24/2023 4:31:19 PM By: Samuella Bruin Entered By: Samuella Bruin on 04/24/2023 14:42:35 -------------------------------------------------------------------------------- Wound Assessment Details Patient Name: Date of Service: [name], [name] 04/24/2023 2:15 PM Medical Record Number: [id number] Patient Account Number: [id number] Date of Birth/Sex: Treating RN: 04/09/1946 (77 y.o. [name] Primary Care Amaura Authier: Eustaquio Boyden Other Clinician: Referring La Dibella: Treating Ragan Reale/Extender: Katharina Caper Weeks in Treatment: 38 Wound Status Wound Number: 2 Primary Venous Leg Ulcer Etiology: Wound Location: Left, Posterior Lower Leg Wound Open Wounding Event: Blister Status: Date Acquired: 07/06/2022 Comorbid Cataracts, Asthma, Hypertension, Neuropathy, Received Weeks Of Treatment: 38 History: Chemotherapy, Received Radiation Clustered Wound: No Photos [name], [name] ([id number]) [id number] Page 7 of 8 Wound Measurements Length: (cm) 4.3 Width: (cm) 2.5 Depth: (cm) 0.2 Area: (cm) 8.443 Volume: (cm) 1.689 % Reduction in Area: -22.9% % Reduction in Volume: -145.9% Epithelialization: Small (1-33%) Tunneling: No Undermining: No Wound Description Classification: Full Thickness Without Exposed Support Structures Wound Margin: Distinct, outline attached Exudate Amount: Medium Exudate Type: Serosanguineous Exudate Color: red, brown Foul Odor After Cleansing:  No Slough/Fibrino Yes Wound Bed Granulation Amount: Medium (34-66%) Exposed Structure Granulation Quality: Red Fascia Exposed: No Necrotic Amount: Medium (34-66%) Fat Layer (Subcutaneous Tissue) Exposed: Yes Necrotic Quality: Eschar, Adherent Slough Tendon Exposed: No Muscle Exposed: No Joint Exposed: No Bone Exposed: No Periwound Skin Texture Texture Color No Abnormalities Noted: Yes No Abnormalities Noted: No Rubor: Yes Moisture No Abnormalities Noted: No Temperature / Pain Dry / Scaly: No Temperature: No Abnormality Maceration: Yes Tenderness on Palpation: Yes Electronic Signature(s) Signed: 04/24/2023 4:31:19 PM By: Samuella Bruin Signed: 05/29/2023 2:27:14 PM By: Karl Ito Entered By: Karl Ito on 04/24/2023 14:21:04 -------------------------------------------------------------------------------- Vitals Details Patient  Name: Date of Service: [name], COLASURDO 04/24/2023 2:15 PM Medical Record Number: [id number] Patient Account Number: [id number] Date of Birth/Sex: Treating RN: 1946/01/31 (77 y.o. [name] Primary Care Leeyah Heather: Eustaquio Boyden Other Clinician: Referring Sayana Salley: Treating Arshia Spellman/Extender: Priscille Loveless in Treatment: 38 Vital Signs Time Taken: 14:12 Temperature (F): 98.1 Height (in): 63 Pulse (bpm): 71 Weight (lbs): 200 Respiratory Rate (breaths/min): 18 Body Mass Index (BMI): 35.4 Blood Pressure (mmHg): 167/98 Reference Range: 80 - 120 mg / dl LAKISA, LOTZ ([id number]) [id number] Page 8 of 8 Electronic Signature(s) Signed: 04/24/2023 4:31:19 PM By: Samuella Bruin Entered By: Samuella Bruin on 04/24/2023 14:13:11

## 2023-05-29 NOTE — Progress Notes (Signed)
[name], [name] ([id number]) [id number] Page 1 of 7 Visit Report for 05/22/2023 Arrival Information Details Patient Name: Date of Service: [name], [name] 05/22/2023 2:15 PM Medical Record Number: [id number] Patient Account Number: [id number] Date of Birth/Sex: Treating RN: [name] (77 y.o. F) Primary Care [name]: [name] Other Clinician: Referring [name]: Treating [name]/Extender: [name] in Treatment: 42 Visit Information History Since Last Visit All ordered tests and consults were completed: No Patient Arrived: Ambulatory Added or deleted any medications: No Arrival Time: 14:58 Any new allergies or adverse reactions: No Accompanied By: self Had a fall or experienced change in No Transfer Assistance: None activities of daily living that may affect Patient Identification Verified: Yes risk of falls: Secondary Verification Process Completed: Yes Signs or symptoms of abuse/neglect since last visito No Patient Requires Transmission-Based Precautions: No Hospitalized since last visit: No Patient Has Alerts: No Implantable device outside of the clinic excluding No cellular tissue based products placed in the center since last visit: Pain Present Now: No Electronic Signature(s) Signed: 05/22/2023 5:12:05 PM By: [name] Entered By: [name] on 05/22/2023 14:58:25 -------------------------------------------------------------------------------- Encounter Discharge Information Details Patient Name: Date of Service: [name] 05/22/2023 2:15 PM Medical Record Number: [id number] Patient Account Number: [id number] Date of Birth/Sex: Treating RN: [name] (77 y.o. [name] Primary Care Lexee Brashears: [name] Other Clinician: Referring Zela Sobieski: Treating Analina Filla/Extender: [name] in Treatment: [phone] Encounter Discharge Information Items Post Procedure  Vitals Discharge Condition: Stable Temperature (F): 98.3 Ambulatory Status: Cane Pulse (bpm): 78 Discharge Destination: Home Respiratory Rate (breaths/min): 18 Transportation: Private Auto Blood Pressure (mmHg): 132/65 Accompanied By: self Schedule Follow-up Appointment: Yes Clinical Summary of Care: Patient Declined Electronic Signature(s) Signed: 05/29/2023 11:11:00 AM By: Brenton Grills Entered By: Brenton Grills on 05/22/2023 15:47:39 [name], [name] ([id number]) [id number] Page 2 of 7 -------------------------------------------------------------------------------- Lower Extremity Assessment Details Patient Name: Date of Service: [name] 05/22/2023 2:15 PM Medical Record Number: [id number] Patient Account Number: [id number] Date of Birth/Sex: Treating RN: [birth year] (77 y.o. [name] Primary Care Tenasia Aull: [name] Other Clinician: Referring Alli Jasmer: Treating Tharon Kitch/Extender: [name] in Treatment: 42 Edema Assessment Assessed: [name]: No] [name]: No] Edema: [Left: Ye] [Right: s] Calf Left: Right: Point of Measurement: 31 cm From Medial Instep 45.4 cm Ankle Left: Right: Point of Measurement: 9 cm From Medial Instep 24.8 cm Vascular Assessment Pulses: Dorsalis Pedis Palpable: [Left:Yes] Extremity colors, hair growth, and conditions: Extremity Color: [Left:Normal] Hair Growth on Extremity: [Left:Yes] Temperature of Extremity: [Left:Warm] Capillary Refill: [Left:< 3 seconds] Dependent Rubor: [Left:No No] Toe Nail Assessment Left: Right: Thick: Yes Discolored: Yes Deformed: Yes Improper Length and Hygiene: Yes Electronic Signature(s) Signed: 05/29/2023 11:11:00 AM By: Brenton Grills Entered By: Brenton Grills on 05/22/2023 15:07:36 -------------------------------------------------------------------------------- Multi Wound Chart Details Patient Name: Date of Service: [name] 05/22/2023 2:15 PM Medical Record Number: [id number] Patient Account Number: [id number] Date of Birth/Sex: Treating RN: [name] (77 y.o. F) Primary Care Asya Derryberry: [name] Other Clinician: Referring Veeda Virgo: Treating Breyden Jeudy/Extender: [name] in Treatment: 42 Vital Signs Height(in): 63 Pulse(bpm): 78 Weight(lbs): 200 Blood Pressure(mmHg): 151/79 Body Mass Index(BMI): 35.4 [name] ([id number]) [id number] Page 3 of 7 Temperature(F): 98.6 Respiratory Rate(breaths/min): 18 [2:Photos:] [N/A:N/A] Left, Posterior Lower Leg N/A N/A Wound Location: Blister N/A N/A Wounding Event: Venous Leg Ulcer N/A N/A Primary Etiology: Cataracts, Asthma, Hypertension, N/A N/A Comorbid History: Neuropathy, Received Chemotherapy, Received Radiation 07/06/2022 N/A N/A Date Acquired: 24 N/A N/A Weeks  of Treatment: Open N/A N/A Wound Status: No N/A N/A Wound Recurrence: 3.8x2.3x0.2 N/A N/A Measurements L x W x D (cm) 6.864 N/A N/A A (cm) : rea 1.373 N/A N/A Volume (cm) : 0.10% N/A N/A % Reduction in A rea: -99.90% N/A N/A % Reduction in Volume: Full Thickness Without Exposed N/A N/A Classification: Support Structures Medium N/A N/A Exudate A mount: Serosanguineous N/A N/A Exudate Type: red, brown N/A N/A Exudate Color: Distinct, outline attached N/A N/A Wound Margin: Medium (34-66%) N/A N/A Granulation A mount: Red N/A N/A Granulation Quality: Medium (34-66%) N/A N/A Necrotic A mount: Eschar, Adherent Slough N/A N/A Necrotic Tissue: Fat Layer (Subcutaneous Tissue): Yes N/A N/A Exposed Structures: Fascia: No Tendon: No Muscle: No Joint: No Bone: No Small (1-33%) N/A N/A Epithelialization: Debridement - Selective/Open Wound N/A N/A Debridement: Pre-procedure Verification/Time Out 15:25 N/A N/A Taken: Lidocaine 4% Topical Solution N/A N/A Pain Control: Slough N/A N/A Tissue  Debrided: Non-Viable Tissue N/A N/A Level: 6.86 N/A N/A Debridement A (sq cm): rea Curette N/A N/A Instrument: Minimum N/A N/A Bleeding: Pressure N/A N/A Hemostasis A chieved: 0 N/A N/A Procedural Pain: 0 N/A N/A Post Procedural Pain: Procedure was tolerated well N/A N/A Debridement Treatment Response: 3.8x2.3x0.2 N/A N/A Post Debridement Measurements L x W x D (cm) 1.373 N/A N/A Post Debridement Volume: (cm) Scarring: Yes N/A N/A Periwound Skin Texture: Maceration: Yes N/A N/A Periwound Skin Moisture: Dry/Scaly: No Rubor: Yes N/A N/A Periwound Skin Color: No Abnormality N/A N/A Temperature: Yes N/A N/A Tenderness on Palpation: Debridement N/A N/A Procedures Performed: Treatment Notes Electronic Signature(s) Signed: 05/22/2023 3:38:23 PM By: Duanne Guess MD FACS Entered By: Duanne Guess on 05/22/2023 15:38:23 [name] ([id number]) [id number] Page 4 of 7 -------------------------------------------------------------------------------- Multi-Disciplinary Care Plan Details Patient Name: Date of Service: [name], [name] 05/22/2023 2:15 PM Medical Record Number: [id number] Patient Account Number: [id number] Date of Birth/Sex: Treating RN: [name] (77 y.o. [name] Primary Care Tanee Henery: [name] Other Clinician: Referring Karan Ramnauth: Treating Maghen Group/Extender: [name] in Treatment: 12 Multidisciplinary Care Plan reviewed with physician Active Inactive Venous Leg Ulcer Nursing Diagnoses: Knowledge deficit related to disease process and management Potential for venous Insuffiency (use before diagnosis confirmed) Goals: Patient will maintain optimal edema control Date Initiated: 02/07/2023 Target Resolution Date: 07/05/2023 Goal Status: Active Interventions: Assess peripheral edema status every visit. Compression as ordered Treatment Activities: Therapeutic compression  applied : 02/07/2023 Notes: Wound/Skin Impairment Nursing Diagnoses: Impaired tissue integrity Goals: Ulcer/skin breakdown will have a volume reduction of 30% by week 4 Date Initiated: 08/03/2022 Date Inactivated: 09/10/2022 Target Resolution Date: 08/31/2022 Unmet Reason: uncontrolled edema, Goal Status: Unmet acute infection Ulcer/skin breakdown will have a volume reduction of 50% by week 8 Date Initiated: 09/10/2022 Target Resolution Date: 07/05/2023 Goal Status: Active Interventions: Assess ulceration(s) every visit Treatment Activities: Skin care regimen initiated : 08/03/2022 Notes: Keystone ordered. 10/30 RX Levo started. 09/10/22 Electronic Signature(s) Signed: 05/29/2023 11:11:00 AM By: Brenton Grills Entered By: Brenton Grills on 05/22/2023 15:16:03 Pain Assessment Details -------------------------------------------------------------------------------- [name] ([id number]) [id number] Page 5 of 7 Patient Name: Date of Service: [name], [name] 05/22/2023 2:15 PM Medical Record Number: [id number] Patient Account Number: [id number] Date of Birth/Sex: Treating RN: [birth year] (77 y.o. F) Primary Care Margot Oriordan: [name] Other Clinician: Referring Matha Masse: Treating Marda Breidenbach/Extender: [name] in Treatment: 42 Active Problems Location of Pain Severity and Description of Pain Patient Has Paino No Site Locations Pain Management and Medication Current Pain Management: Electronic Signature(s) Signed: 05/22/2023 5:12:05  PM By: [name] Entered By: [name] on 05/22/2023 14:58:51 -------------------------------------------------------------------------------- Patient/Caregiver Education Details Patient Name: Date of Service: [name], [name] 7/17/2024andnbsp2:15 PM Medical Record Number: [id number] Patient Account Number: [id number] Date of Birth/Gender: Treating RN: [name] (77 y.o. [name] Primary Care Physician: [name] Other Clinician: Referring Physician: Treating Physician/Extender: [name] in Treatment: 34 Education Assessment Education Provided To: Patient Education Topics Provided Wound/Skin Impairment: Methods: Explain/Verbal Responses: State content correctly Electronic Signature(s) Signed: 05/29/2023 11:11:00 AM By: Brenton Grills Entered By: Brenton Grills on 05/22/2023 15:16:21 [name] ([id number]) [id number] Page 6 of 7 -------------------------------------------------------------------------------- Wound Assessment Details Patient Name: Date of Service: [name], [name] 05/22/2023 2:15 PM Medical Record Number: [id number] Patient Account Number: [id number] Date of Birth/Sex: Treating RN: [name] (77 y.o. [name] Primary Care Janett Kamath: [name] Other Clinician: Referring Nakayla Rorabaugh: Treating Letty Salvi/Extender: Katharina Caper Weeks in Treatment: 42 Wound Status Wound Number: 2 Primary Venous Leg Ulcer Etiology: Wound Location: Left, Posterior Lower Leg Wound Open Wounding Event: Blister Status: Date Acquired: 07/06/2022 Comorbid Cataracts, Asthma, Hypertension, Neuropathy, Received Weeks Of Treatment: 42 History: Chemotherapy, Received Radiation Clustered Wound: No Photos Wound Measurements Length: (cm) 3.8 Width: (cm) 2.3 Depth: (cm) 0.2 Area: (cm) 6.864 Volume: (cm) 1.373 % Reduction in Area: 0.1% % Reduction in Volume: -99.9% Epithelialization: Small (1-33%) Tunneling: No Undermining: No Wound Description Classification: Full Thickness Without Exposed Suppor Wound Margin: Distinct, outline attached Exudate Amount: Medium Exudate Type: Serosanguineous Exudate Color: red, brown t Structures Foul Odor After Cleansing: No Slough/Fibrino Yes Wound Bed Granulation Amount: Medium (34-66%) Exposed Structure Granulation  Quality: Red Fascia Exposed: No Necrotic Amount: Medium (34-66%) Fat Layer (Subcutaneous Tissue) Exposed: Yes Necrotic Quality: Eschar, Adherent Slough Tendon Exposed: No Muscle Exposed: No Joint Exposed: No Bone Exposed: No Periwound Skin Texture Texture Color No Abnormalities Noted: Yes No Abnormalities Noted: No Rubor: Yes Moisture No Abnormalities Noted: No Temperature / Pain Dry / Scaly: No Temperature: No Abnormality Maceration: Yes Tenderness on Palpation: Yes Treatment Notes Wound #2 (Lower Leg) Wound Laterality: Left, Posterior Cleanser [name], [name] ([id number]) [id number] Page 7 of 7 Soap and Water Discharge Instruction: May shower and wash wound with dial antibacterial soap and water prior to dressing change. Peri-Wound Care Sween Lotion (Moisturizing lotion) Discharge Instruction: Apply moisturizing lotion as directed Topical Gentamicin Discharge Instruction: As directed by physician Mupirocin Ointment Discharge Instruction: Apply Mupirocin (Bactroban) as instructed Skintegrity Hydrogel 4 (oz) Discharge Instruction: Apply hydrogel as directed Primary Dressing Hydrofera Blue Ready Transfer Foam, 2.5x2.5 (in/in) Discharge Instruction: Apply directly to wound bed as directed Secondary Dressing Zetuvit Plus 4x8 in Discharge Instruction: Apply over primary dressing as directed. Secured With [employer] T 2x10 (in/yd) ape Discharge Instruction: Secure with tape as directed. Compression Wrap Unnaboot w/Calamine, 4x10 (in/yd) Discharge Instruction: Apply Unnaboot as directed. Compression Stockings Add-Ons Electronic Signature(s) Signed: 05/29/2023 11:11:00 AM By: Brenton Grills Entered By: Brenton Grills on 05/22/2023 15:10:37 -------------------------------------------------------------------------------- Vitals Details Patient Name: Date of Service: [name] 05/22/2023 2:15 PM Medical Record Number:  [id number] Patient Account Number: [id number] Date of Birth/Sex: Treating RN: [birth year] (77 y.o. F) Primary Care Ahjanae Cassel: [name] Other Clinician: Referring Drishti Pepperman: Treating Lashauna Arpin/Extender: [name] in Treatment: 42 Vital Signs Time Taken: 02:58 Temperature (F): 98.6 Height (in): 63 Pulse (bpm): 78 Weight (lbs): 200 Respiratory Rate (breaths/min): 18 Body Mass Index (BMI): 35.4 Blood Pressure (mmHg): 151/79 Reference Range: 80 - 120 mg / dl Electronic Signature(s) Signed: 05/22/2023  5:12:05 PM By: [name] Entered By: [name] on 05/22/2023 14:58:45

## 2023-05-29 NOTE — Progress Notes (Signed)
[name] ([id number]) [id number] Page 1 of 7 Visit Report for 05/29/2023 Arrival Information Details Patient Name: Date of Service: [name], [name] 05/29/2023 2:30 PM Medical Record Number: [id number] Patient Account Number: [id number] Date of Birth/Sex: Treating RN: 02-24-1946 (77 y.o. F) Primary Care Jasmin Trumbull: Eustaquio Boyden Other Clinician: Referring Janann Boeve: Treating Marlane Hirschmann/Extender: Priscille Loveless in Treatment: 35 Visit Information History Since Last Visit All ordered tests and consults were completed: No Patient Arrived: Ambulatory Added or deleted any medications: No Arrival Time: 14:36 Any new allergies or adverse reactions: No Accompanied By: self Had a fall or experienced change in No Transfer Assistance: None activities of daily living that may affect Patient Identification Verified: Yes risk of falls: Secondary Verification Process Completed: Yes Signs or symptoms of abuse/neglect since last visito No Patient Requires Transmission-Based Precautions: No Hospitalized since last visit: No Patient Has Alerts: No Implantable device outside of the clinic excluding No cellular tissue based products placed in the center since last visit: Pain Present Now: No Electronic Signature(s) Signed: 05/29/2023 4:43:46 PM By: Dayton Scrape Entered By: Dayton Scrape on 05/29/2023 14:37:12 -------------------------------------------------------------------------------- Compression Therapy Details Patient Name: Date of Service: [name], [name] 05/29/2023 2:30 PM Medical Record Number: [id number] Patient Account Number: [id number] Date of Birth/Sex: Treating RN: [birth year] (77 y.o. [name] Primary Care Hassaan Crite: Eustaquio Boyden Other Clinician: Referring Graceanne Guin: Treating Cono Gebhard/Extender: Priscille Loveless in Treatment: 03 Compression Therapy Performed for Wound Assessment: Wound #2  Left,Posterior Lower Leg Performed By: Clinician Brenton Grills, RN Compression Type: Three Layer Post Procedure Diagnosis Same as Pre-procedure Notes Scribed for Dr Lady Gary by Brenton Grills RN Electronic Signature(s) Signed: 05/29/2023 5:05:32 PM By: Brenton Grills Entered By: Brenton Grills on 05/29/2023 16:45:29 [name] ([id number]) [id number] Page 2 of 7 -------------------------------------------------------------------------------- Encounter Discharge Information Details Patient Name: Date of Service: [name], [name] 05/29/2023 2:30 PM Medical Record Number: [id number] Patient Account Number: [id number] Date of Birth/Sex: Treating RN: 1946-05-16 (77 y.o. [name] Primary Care Sadia Belfiore: Eustaquio Boyden Other Clinician: Referring Yenny Kosa: Treating Jhamir Pickup/Extender: Priscille Loveless in Treatment: 26 Encounter Discharge Information Items Discharge Condition: Stable Ambulatory Status: Ambulatory Discharge Destination: Home Transportation: Private Auto Accompanied By: self Schedule Follow-up Appointment: Yes Clinical Summary of Care: Patient Declined Electronic Signature(s) Signed: 05/29/2023 5:05:32 PM By: Brenton Grills Entered By: Brenton Grills on 05/29/2023 15:16:00 -------------------------------------------------------------------------------- Lower Extremity Assessment Details Patient Name: Date of Service: [name], [name] 05/29/2023 2:30 PM Medical Record Number: [id number] Patient Account Number: [id number] Date of Birth/Sex: Treating RN: 09-[name]-1946 (77 y.o. [name] Primary Care Euclid Cassetta: Eustaquio Boyden Other Clinician: Referring Anderson Middlebrooks: Treating Kennadie Brenner/Extender: Priscille Loveless in Treatment: 43 Edema Assessment Assessed: [name]: No] [name]: No] Edema: [Left: Ye] [Right: s] Calf Left: Right: Point of Measurement: 31 cm From Medial Instep 46  cm Ankle Left: Right: Point of Measurement: 9 cm From Medial Instep 24 cm Vascular Assessment Pulses: Dorsalis Pedis Palpable: [Left:Yes] Extremity colors, hair growth, and conditions: Extremity Color: [Left:Normal] Hair Growth on Extremity: [Left:Yes] Temperature of Extremity: [Left:Warm] Capillary Refill: [Left:< 3 seconds] Dependent Rubor: [Left:No No] Toe Nail Assessment Left: Right: [name] ([id number]) [id number] Page 3 of 7 Thick: Yes Discolored: Yes Deformed: Yes Improper Length and Hygiene: Yes Electronic Signature(s) Signed: 05/29/2023 5:05:32 PM By: Brenton Grills Entered By: Brenton Grills on 05/29/2023 14:46:32 -------------------------------------------------------------------------------- Multi Wound Chart Details Patient Name: Date of Service: [name] 05/29/2023 2:30 PM Medical Record Number: [id number] Patient Account Number: [id number] Date of Birth/Sex:  Treating RN: 1946-08-18 (77 y.o. F) Primary Care Breion Novacek: Eustaquio Boyden Other Clinician: Referring Mikail Goostree: Treating Dallana Mavity/Extender: Priscille Loveless in Treatment: 43 Vital Signs Height(in): 63 Pulse(bpm): 73 Weight(lbs): 200 Blood Pressure(mmHg): 145/82 Body Mass Index(BMI): 35.4 Temperature(F): 98.1 Respiratory Rate(breaths/min): 18 [2:Photos: No Photos Left, Posterior Lower Leg Wound Location: Blister Wounding Event: Venous Leg Ulcer Primary Etiology: Cataracts, Asthma, Hypertension, Comorbid History: Neuropathy, Received Chemotherapy, Received Radiation 07/06/2022 Date Acquired: 54 Weeks of  Treatment: Open Wound Status: No Wound Recurrence: 3.7x2.2x0.2 Measurements L x W x D (cm) 6.393 A (cm) : rea 1.279 Volume (cm) : 7.00% % Reduction in Area: -86.20% % Reduction in Volume: Full Thickness Without Exposed Classification: Support Structures  Medium Exudate A mount: Serosanguineous Exudate Type: red, brown Exudate Color: Distinct,  outline attached Wound Margin: Medium (34-66%) Granulation Amount: Red Granulation Quality: Medium (34-66%) Necrotic Amount: Eschar, Adherent Slough Necrotic  Tissue: Fat Layer (Subcutaneous Tissue): Yes N/A Exposed Structures: Fascia: No Tendon: No Muscle: No Joint: No Bone: No Small (1-33%) Epithelialization: Scarring: Yes Periwound Skin Texture: Maceration: Yes Periwound Skin Moisture: Dry/Scaly: No Rubor:  Yes Periwound Skin Color: No Abnormality Temperature: Yes Tenderness on Palpation:] [N/A:N/A N/A N/A N/A N/A N/A N/A N/A N/A N/A N/A N/A N/A N/A N/A N/A N/A N/A N/A N/A N/A N/A N/A N/A N/A N/A N/A N/A N/A] Treatment Notes [name], [name] ([id number]) [id number] Page 4 of 7 Electronic Signature(s) Signed: 05/29/2023 3:08:13 PM By: Duanne Guess MD FACS Entered By: Duanne Guess on 05/29/2023 15:08:13 -------------------------------------------------------------------------------- Multi-Disciplinary Care Plan Details Patient Name: Date of Service: [name], [name] 05/29/2023 2:30 PM Medical Record Number: [id number] Patient Account Number: [id number] Date of Birth/Sex: Treating RN: February 15, 1946 (77 y.o. [name] Primary Care Hulet Ehrmann: Eustaquio Boyden Other Clinician: Referring Denard Tuminello: Treating Jaryah Aracena/Extender: Priscille Loveless in Treatment: 92 Multidisciplinary Care Plan reviewed with physician Active Inactive Venous Leg Ulcer Nursing Diagnoses: Knowledge deficit related to disease process and management Potential for venous Insuffiency (use before diagnosis confirmed) Goals: Patient will maintain optimal edema control Date Initiated: 02/07/2023 Target Resolution Date: 07/05/2023 Goal Status: Active Interventions: Assess peripheral edema status every visit. Compression as ordered Treatment Activities: Therapeutic compression applied : 02/07/2023 Notes: Wound/Skin Impairment Nursing Diagnoses: Impaired tissue  integrity Goals: Ulcer/skin breakdown will have a volume reduction of 30% by week 4 Date Initiated: 08/03/2022 Date Inactivated: 09/10/2022 Target Resolution Date: 08/31/2022 Unmet Reason: uncontrolled edema, Goal Status: Unmet acute infection Ulcer/skin breakdown will have a volume reduction of 50% by week 8 Date Initiated: 09/10/2022 Target Resolution Date: 07/05/2023 Goal Status: Active Interventions: Assess ulceration(s) every visit Treatment Activities: Skin care regimen initiated : 08/03/2022 Notes: Keystone ordered. 10/30 RX Levo started. 09/10/22 Electronic Signature(s) Signed: 05/29/2023 5:05:32 PM By: Brenton Grills Entered By: Brenton Grills on 05/29/2023 14:48:52 [name] ([id number]) [id number] Page 5 of 7 -------------------------------------------------------------------------------- Pain Assessment Details Patient Name: Date of Service: [name], [name] 05/29/2023 2:30 PM Medical Record Number: [id number] Patient Account Number: [id number] Date of Birth/Sex: Treating RN: 1946/03/13 (77 y.o. F) Primary Care Leotta Weingarten: Eustaquio Boyden Other Clinician: Referring Darron Stuck: Treating Katoria Yetman/Extender: Priscille Loveless in Treatment: 46 Active Problems Location of Pain Severity and Description of Pain Patient Has Paino No Site Locations Pain Management and Medication Current Pain Management: Electronic Signature(s) Signed: 05/29/2023 4:43:46 PM By: Dayton Scrape Entered By: Dayton Scrape on 05/29/2023 14:37:41 -------------------------------------------------------------------------------- Patient/Caregiver Education Details Patient Name: Date of Service: [name] 7/24/2024andnbsp2:30 PM Medical Record Number: [id number] Patient Account Number: [id number] Date of Birth/Gender:  Treating RN: 1946-06-02 (77 y.o. [name] Primary Care Physician: Eustaquio Boyden Other Clinician: Referring  Physician: Treating Physician/Extender: Priscille Loveless in Treatment: 26 Education Assessment Education Provided To: Patient Education Topics Provided Wound/Skin Impairment: Methods: Explain/Verbal Responses: State content correctly [name], [name] ([id number]) [id number] Page 6 of 7 Electronic Signature(s) Signed: 05/29/2023 5:05:32 PM By: Brenton Grills Entered By: Brenton Grills on 05/29/2023 14:49:15 -------------------------------------------------------------------------------- Wound Assessment Details Patient Name: Date of Service: [name], [name] 05/29/2023 2:30 PM Medical Record Number: [id number] Patient Account Number: [id number] Date of Birth/Sex: Treating RN: 23-Jul-1946 (77 y.o. F) Primary Care Vegas Coffin: Eustaquio Boyden Other Clinician: Referring Sheryll Dymek: Treating Kellianne Ek/Extender: Priscille Loveless in Treatment: 43 Wound Status Wound Number: 2 Primary Venous Leg Ulcer Etiology: Wound Location: Left, Posterior Lower Leg Wound Open Wounding Event: Blister Status: Date Acquired: 07/06/2022 Comorbid Cataracts, Asthma, Hypertension, Neuropathy, Received Weeks Of Treatment: 43 History: Chemotherapy, Received Radiation Clustered Wound: No Wound Measurements Length: (cm) 3.7 Width: (cm) 2.2 Depth: (cm) 0.2 Area: (cm) 6.393 Volume: (cm) 1.279 % Reduction in Area: 7% % Reduction in Volume: -86.2% Epithelialization: Small (1-33%) Tunneling: No Undermining: No Wound Description Classification: Full Thickness Without Exposed Support Structures Wound Margin: Distinct, outline attached Exudate Amount: Medium Exudate Type: Serosanguineous Exudate Color: red, brown Foul Odor After Cleansing: No Slough/Fibrino Yes Wound Bed Granulation Amount: Medium (34-66%) Exposed Structure Granulation Quality: Red Fascia Exposed: No Necrotic Amount: Medium (34-66%) Fat Layer (Subcutaneous Tissue)  Exposed: Yes Necrotic Quality: Eschar, Adherent Slough Tendon Exposed: No Muscle Exposed: No Joint Exposed: No Bone Exposed: No Periwound Skin Texture Texture Color No Abnormalities Noted: Yes No Abnormalities Noted: No Rubor: Yes Moisture No Abnormalities Noted: No Temperature / Pain Dry / Scaly: No Temperature: No Abnormality Maceration: Yes Tenderness on Palpation: Yes Treatment Notes Wound #2 (Lower Leg) Wound Laterality: Left, Posterior Cleanser Soap and Water Discharge Instruction: May shower and wash wound with dial antibacterial soap and water prior to dressing change. Peri-Wound Care Sween Lotion (Moisturizing lotion) Discharge Instruction: Apply moisturizing lotion as directed [name], [name] ([id number]) [id number] Page 7 of 7 Topical Gentamicin Discharge Instruction: As directed by physician Mupirocin Ointment Discharge Instruction: Apply Mupirocin (Bactroban) as instructed Skintegrity Hydrogel 4 (oz) Discharge Instruction: Apply hydrogel as directed Primary Dressing Hydrofera Blue Ready Transfer Foam, 2.5x2.5 (in/in) Discharge Instruction: Apply directly to wound bed as directed Secondary Dressing Optifoam Non-Adhesive Dressing, 4x4 in Discharge Instruction: Apply over primary dressing as directed. Zetuvit Plus 4x8 in Discharge Instruction: Apply over primary dressing as directed. Secured With [employer] T 2x10 (in/yd) ape Discharge Instruction: Secure with tape as directed. Compression Wrap Unnaboot w/Calamine, 4x10 (in/yd) Discharge Instruction: Apply Unnaboot as directed. Compression Stockings Add-Ons Electronic Signature(s) Signed: 05/29/2023 5:05:32 PM By: Brenton Grills Entered By: Brenton Grills on 05/29/2023 14:47:14 -------------------------------------------------------------------------------- Vitals Details Patient Name: Date of Service: [name], [name] 05/29/2023 2:30 PM Medical Record  Number: [id number] Patient Account Number: [id number] Date of Birth/Sex: Treating RN: 13-Oct-1946 (77 y.o. F) Primary Care Eiliana Drone: Eustaquio Boyden Other Clinician: Referring Thelda Gagan: Treating Alynn Ellithorpe/Extender: Priscille Loveless in Treatment: 43 Vital Signs Time Taken: 02:37 Temperature (F): 98.1 Height (in): 63 Pulse (bpm): 73 Weight (lbs): 200 Respiratory Rate (breaths/min): 18 Body Mass Index (BMI): 35.4 Blood Pressure (mmHg): 145/82 Reference Range: 80 - 120 mg / dl Electronic Signature(s) Signed: 05/29/2023 4:43:46 PM By: Dayton Scrape Entered By: Dayton Scrape on 05/29/2023 14:37:36

## 2023-05-30 NOTE — Progress Notes (Addendum)
[name], [name] ([id number]) [id number] Page 1 of 12 Visit Report for 05/29/2023 Chief Complaint Document Details Patient Name: Date of Service: [name], [name] 05/29/2023 2:30 PM Medical Record Number: [id number] Patient Account Number: [id number] Date of Birth/Sex: Treating RN: 06-21-1946 (76 y.o. F) Primary Care Provider: Eustaquio Boyden Other Clinician: Referring Provider: Treating Provider/Extender: Priscille Loveless in Treatment: 10 Information Obtained from: Patient Chief Complaint Patient presents for treatment of an open ulcer due to venous insufficiency Electronic Signature(s) Signed: 05/29/2023 3:10:06 PM By: Duanne Guess MD FACS Entered By: Duanne Guess on 05/29/2023 15:10:06 -------------------------------------------------------------------------------- Debridement Details Patient Name: Date of Service: [name], [name] 05/29/2023 2:30 PM Medical Record Number: [id number] Patient Account Number: [id number] Date of Birth/Sex: Treating RN: Mar 08, 1946 (77 y.o. [name] Primary Care Provider: Eustaquio Boyden Other Clinician: Referring Provider: Treating Provider/Extender: Priscille Loveless in Treatment: 43 Debridement Performed for Assessment: Wound #2 Left,Posterior Lower Leg Performed By: Physician Duanne Guess, MD Debridement Type: Debridement Severity of Tissue Pre Debridement: Fat layer exposed Level of Consciousness (Pre-procedure): Awake and Alert Pre-procedure Verification/Time Out Yes - 15:00 Taken: Start Time: 15:02 Pain Control: Lidocaine 4% T opical Solution Percent of Wound Bed Debrided: 100% T Area Debrided (cm): otal 6.39 Tissue and other material debrided: Viable, Non-Viable, Slough, Slough Level: Non-Viable Tissue Debridement Description: Selective/Open Wound Instrument: Curette Bleeding: Minimum Hemostasis Achieved: Pressure End Time: 15:06 Procedural  Pain: 0 Post Procedural Pain: 0 Response to Treatment: Procedure was tolerated well Level of Consciousness (Post- Awake and Alert procedure): Post Debridement Measurements of Total Wound Length: (cm) 3.7 Width: (cm) 2.2 Depth: (cm) 0.2 Volume: (cm) 1.279 Character of Wound/Ulcer Post Debridement: Improved [name], [name] ([id number]) [id number] Page 2 of 12 Severity of Tissue Post Debridement: Fat layer exposed Post Procedure Diagnosis Same as Pre-procedure Notes Scribed for Dr Lady Gary by Brenton Grills RN Electronic Signature(s) Signed: 05/29/2023 5:05:32 PM By: Brenton Grills Signed: 05/30/2023 7:51:11 AM By: Duanne Guess MD FACS Entered By: Brenton Grills on 05/29/2023 16:45:04 -------------------------------------------------------------------------------- HPI Details Patient Name: Date of Service: [name] 05/29/2023 2:30 PM Medical Record Number: [id number] Patient Account Number: [id number] Date of Birth/Sex: Treating RN: 1946/01/26 (77 y.o. F) Primary Care Provider: Eustaquio Boyden Other Clinician: Referring Provider: Treating Provider/Extender: Priscille Loveless in Treatment: 28 History of Present Illness HPI Description: ADMISSION This is a 77 year old woman with a history of chronic venous insufficiency status post saphenous vein ablations in 2010 and 2016. 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 She presents today with an open venous ulcer on her left lower leg. It has been present for a little over 2 weeks. She saw her primary care provider who apparently swabbed the wound and grew out Pseudomonas. She just completed a course of ciprofloxacin for this. ABI was 0.91. She reports that she has had previous issues with ulcers in this same location, stemming back to a punch biopsy taken by a dermatologist many years ago. She has had several skin substitutes applied to the area that  have ultimately resulted in healing on prior occasions. She has 2 small ulcers on her left medial lower leg. There is slough accumulation in both of them. The more anterior of the 2 is quite small and has some soft slough on the surface, underneath which there is good granulation tissue. The more medial wound also has slough accumulation, but the underlying surface is fairly fibrotic and gritty. This is consistent with her provided history of multiple ulcers  in the same location. 08/03/2022: The anterior wound is smaller today with just a little bit of slough accumulation. The more medial wound continues to be very sensitive and fibrotic with slough buildup. 08/13/2022: The anterior wound is closed. The more medial wound remains sensitive with a fairly fibrotic surface. There is more granulation tissue filling in, however, and there is less slough than on prior occasions. 08/20/2022: The anterior wound remains closed. The more medial wound has filled with granulation tissue but still has a fair amount of slough on the surface and remains fairly tender. Unfortunately, she has developed 2 small ulcers just proximal to this. The fat layer is exposed in each. She says that over the weekend, she felt a burning sensation in that location. 08/27/2022: The 2 small wounds proximal to the main wound have merged into a single site. The wounds look a little bit dry, but they are quite clean without significant slough accumulation. They remain quite tender. 09/03/2022: All of the wounds have now merged into 1 large wound. There is a strong odor coming from the wound and the surface is not particularly viable. It is extremely painful for her today. 09/10/2022: The wound is less black and purple this week but still does not look particularly viable. The surface is desiccated. The culture that I took last week returned with a polymicrobial population including Pseudomonas. I prescribed both Augmentin and levofloxacin.  She has not yet picked up levofloxacin, as her pharmacy only notified her yesterday that it was ready. We have ordered a Keystone topical compounded antibiotic, but it has not yet come. 09/17/2022: The wound surface is markedly improved today. There is still an area of grayish-looking muscle but the rest appears significantly more viable. There is still a layer of slough on the surface. She still has a couple days left of oral antibiotic therapy. She has her Jodie Echevaria compounded topical antibiotic with her today. 09/24/2022: She has completed her oral antibiotic therapy. The wound surface is much cleaner today and more viable without any necrotic tissue. It is a bit desiccated, however. 10/01/2022: The moisture balance of the wound has improved. There is a layer of yellow slough on the surface, but beneath this, the wound is more pink. 10/08/2022: The wound is smaller and cleaner today with a layer of slough present. She is having less pain. 12/18; this patient has a new wound on the right lateral ankle which apparently started after a cat scratched her. Secondarily she managed to drop a cake pan on the same area. This is very painful. The original wound was on the left posterior calf we have been using [employer] under an [employer]. The Unna boot fell down and apparently she was in for a nurse visit perhaps last week and that 1 fell down as well This patient has central venous mediated hypertension. For member correctly she has a stent placed in the left common iliac artery by Dr. Randie Heinz some years ago [facility] Thurner syndrome] she also has had venous ablations and I believe a history of a DVT ZYKERA, ABELLA ([id number]) [id number] Page 3 of 12 The patient has compression pumps at home but she does not use them 10/30/2022: The patient says that her wounds burned all week with the [facility] classic in place. She has a fair amount of slough and nonviable subcutaneous  tissue present on the right ankle. The left posterior calf wound is cleaner than I have seen it to date. Both remain fairly tender. 11/14/2022:  Both wounds have substantial slough and eschar accumulation. They remain extremely tender. 11/21/2022: The wounds are cleaner this week, but still have slough and eschar accumulation. She continues to describe burning pain in both wounds, but upon further questioning, she actually has burning in both of her feet that radiates up her legs and includes to the wounds. 11/28/2022: Both wounds have slough and eschar accumulation, as well as adherent silver alginate that was difficult to remove. She continues to have pain out of proportion to the extent of her wounds. Her PCP did initiate Lyrica which she started taking last night. The dose is quite low, only 25 mg, but apparently there is a plan for upward titration, assuming she tolerates the medication. 12/05/2022: The wounds are both a little bit smaller today, but have significant slough accumulation, as usual. They remain exquisitely tender. She is now taking Lyrica twice a day. 12/19/2022: Both of her wounds look significantly better this week. There is slough buildup on both, but they are smaller. She seems to be getting a good response from Lyrica as she is having much less pain. 12/26/2022: The wound on her right lateral ankle is nearly closed. The wound on her left posterior calf is smaller but still has a lot of slough accumulation. They remain tender. 01/02/2023: The right lateral ankle wound is down to just a couple of millimeters. It is much less tender than on prior occasions. There is minimal slough buildup. The wound on her posterior calf continues to contract, as well. It has thick slough buildup and remains fairly tender. 01/09/2023: The right lateral ankle wound is nearly closed. He remains tender. There is just a little eschar on the surface. The wound on her posterior calf has improved quite a bit.  There is still slough on the surface, but the more proximal area has epithelialized substantially and there is no longer any gray discoloration to her tissue. 3/13; patient presents for follow-up. Her right lateral ankle wound is almost healed. She has a wound to her left posterior calf with slough and granulation tissue. We have been using Santyl under Unna boots bilaterally to the lower extremities. She has no issues or complaints today. 01/30/2023: The right lateral ankle wound is down to just a pinhole under a layer of eschar. The left posterior calf wound is quite a bit improved since the last time I saw it. There is still a layer of slough on the surface but there has been more epithelialization. 02/07/2023: The right lateral ankle wound still has some eschar on the surface. The left posterior calf wound is about the same size, but with more areas of epithelialization. 02/20/2023: The right lateral ankle wound is healed. The left posterior calf wound is smaller and is essentially flush with the surrounding skin, but the surface is quite dry. Her leg wrap slipped and her calf is quite swollen above the level of the wound. 03/01/2023: The right lateral ankle wound reopened and she also suffered a fall that resulted in a small wound on her right anterior tibial surface. The left posterior calf wound has a much better moisture balance today. There is a layer of slough on the surface. The [employer] worked much better for her as far as compression this week. 03/06/2023: The right lateral ankle wound has a layer of eschar on the surface and is too painful for me to debride. The anterior tibial surface wound is more or less healed; it is a very superficial abrasion at this point.  The left posterior calf wound has a layer of slough on the surface. Her edema control is better. 03/13/2023: The right lateral ankle wound has a layer of eschar on the surface. Once this was debrided, it was found to be healed. The  anterior tibial surface wound has a layer of stuck silver alginate on the surface. This was removed to reveal a very superficial abrasion but not much change from last week. The left posterior calf wound is smaller and has its usual layer of accumulated slough. 03/25/2023: The anterior tibial surface wound has eschar and silver alginate stuck to it. Once this was removed, the wound was found to be healed. The left posterior calf wound is roughly the same size and has a little bit thicker layer of accumulated slough. 04/03/2023: She has had more drainage from the posterior calf wound over the last week and the layer of slough is thicker. She also says it is more tender. 04/10/2023: Continued increase in drainage from her wound. The wound remains tender with a layer of slough on the surface. It does not appear as viable this week. She also has erythema, swelling, and warmth in her entire left lower leg extending into the midfoot. 04/17/2023: The wound is about the same size this week. There is fibrotic adherent slough, but the drainage is not as profuse. She is currently taking levofloxacin. She is complaining of an increase in the burning sensation at her wound. 04/24/2023: The wound measured smaller this week and is much cleaner-looking. She completed her course of levofloxacin. Her PCP increased her dose of Lyrica and she reports that the burning has stopped. 05/01/2023: The wound is smaller again today and quite clean with only minimal slough on the surface. 05/08/2023: The wound is smaller again today. It is fairly clean, but the surface is a bit fibrotic. Moisture balance is better. Her leg is more swollen and erythematous today, however. 05/15/2023: The wound is about the same size today. The surface has very little slough accumulation, but remains fibrotic. Moisture balance is good. She is responding nicely to the oral Keflex she is taking and her leg is less erythematous and edematous. 05/22/2023: The  wound is unchanged in size. There is quite a bit of slough on the surface but the moisture balance is better. It is still fibrotic, but a little bit less so. Her leg is no longer erythematous, but she does have some persistent edema. 05/29/2023: The wound measured a little bit smaller today. It is dry again and the surface is quite fibrotic. The edema in her leg has improved. Electronic Signature(s) Signed: 05/29/2023 3:10:42 PM By: Duanne Guess MD FACS Entered By: Duanne Guess on 05/29/2023 15:10:42 [name] [name] ([id number]) [id number] Page 4 of 12 -------------------------------------------------------------------------------- Physical Exam Details Patient Name: Date of Service: [name], [name] 05/29/2023 2:30 PM Medical Record Number: [id number] Patient Account Number: [id number] Date of Birth/Sex: Treating RN: January 25, 1946 (77 y.o. F) Primary Care Provider: Eustaquio Boyden Other Clinician: Referring Provider: Treating Provider/Extender: Priscille Loveless in Treatment: 26 Constitutional Slightly hypertensive. . . . no acute distress. Respiratory Normal work of breathing on room air. Notes 05/29/2023: The wound measured a little bit smaller today. It is dry again and the surface is quite fibrotic. The edema in her leg has improved. Electronic Signature(s) Signed: 05/29/2023 3:12:42 PM By: Duanne Guess MD FACS Entered By: Duanne Guess on 05/29/2023 15:12:42 -------------------------------------------------------------------------------- Physician Orders Details Patient Name: Date of Service: [name] [name]. 05/29/2023 2:30 PM Medical Record  Number: [id number] Patient Account Number: [id number] Date of Birth/Sex: Treating RN: 04-10-1946 (77 y.o. [name] Primary Care Provider: Eustaquio Boyden Other Clinician: Referring Provider: Treating Provider/Extender: Priscille Loveless in  Treatment: 46 Verbal / Phone Orders: No Diagnosis Coding ICD-10 Coding Code Description [id number] Non-pressure chronic ulcer of other part of left lower leg with fat layer exposed I83.893 Varicose veins of bilateral lower extremities with other complications I87.2 Venous insufficiency (chronic) (peripheral) G62.9 Polyneuropathy, unspecified R60.0 Localized edema Follow-up Appointments ppointment in 1 week. - Dr. Lady Gary Room 3 Return A 05/22/23 NOTE - Switch from HFBR to Endoform starting next visit. Anesthetic Wound #2 Left,Posterior Lower Leg (In clinic) Topical Lidocaine 4% applied to wound bed Bathing/ Shower/ Hygiene May shower with protection but do not get wound dressing(s) wet. Protect dressing(s) with water repellant cover (for example, large plastic bag) or a cast cover and may then take shower. Edema Control - Lymphedema / SCD / Other Left Lower Extremity Lymphedema Pumps. Use Lymphedema pumps on leg(s) 2-3 times a day for 45-60 minutes. If wearing any wraps or hose, do not remove them. Continue exercising as instructed. - Use x1 per day PAMMIE, CHIRINO ([id number]) [id number] Page 5 of 12 Avoid standing for long periods of time. Exercise regularly Off-Loading Wound #2 Left,Posterior Lower Leg Other: - Elevate legs at heart level or above heart level while sitting. Wound Treatment Wound #2 - Lower Leg Wound Laterality: Left, Posterior Cleanser: Soap and Water 1 x Per Week/30 Days Discharge Instructions: May shower and wash wound with dial antibacterial soap and water prior to dressing change. Peri-Wound Care: Sween Lotion (Moisturizing lotion) 1 x Per Week/30 Days Discharge Instructions: Apply moisturizing lotion as directed Topical: Gentamicin 1 x Per Week/30 Days Discharge Instructions: As directed by physician Topical: Mupirocin Ointment 1 x Per Week/30 Days Discharge Instructions: Apply Mupirocin (Bactroban) as instructed Topical:  Skintegrity Hydrogel 4 (oz) 1 x Per Week/30 Days Discharge Instructions: Apply hydrogel as directed Prim Dressing: Hydrofera Blue Ready Transfer Foam, 2.5x2.5 (in/in) 1 x Per Week/30 Days ary Discharge Instructions: Apply directly to wound bed as directed Secondary Dressing: Optifoam Non-Adhesive Dressing, 4x4 in 1 x Per Week/30 Days Discharge Instructions: Apply over primary dressing as directed. Secondary Dressing: Zetuvit Plus 4x8 in 1 x Per Week/30 Days Discharge Instructions: Apply over primary dressing as directed. Secured With: 54M Medipore Scientist, research (life sciences) Surgical T 2x10 (in/yd) 1 x Per Week/30 Days ape Discharge Instructions: Secure with tape as directed. Compression Wrap: Unnaboot w/Calamine, 4x10 (in/yd) 1 x Per Week/30 Days Discharge Instructions: Apply Unnaboot as directed. Electronic Signature(s) Signed: 05/29/2023 4:06:34 PM By: Duanne Guess MD FACS Entered By: Duanne Guess on 05/29/2023 15:13:05 -------------------------------------------------------------------------------- Problem List Details Patient Name: Date of Service: [name], [name] 05/29/2023 2:30 PM Medical Record Number: [id number] Patient Account Number: [id number] Date of Birth/Sex: Treating RN: [birth year] (77 y.o. [name] Primary Care Provider: Eustaquio Boyden Other Clinician: Referring Provider: Treating Provider/Extender: Priscille Loveless in Treatment: 29 Active Problems ICD-10 Encounter Code Description Active Date MDM Diagnosis [phone] Non-pressure chronic ulcer of other part of left lower leg with fat layer exposed 07/27/2022 No Yes I83.893 Varicose veins of bilateral lower extremities with other complications 07/27/2022 No Yes KAITLYND, PHILLIPS ([id number]) [id number] Page 6 of 12 I87.2 Venous insufficiency (chronic) (peripheral) 07/27/2022 No Yes G62.9 Polyneuropathy, unspecified 07/27/2022 No Yes R60.0 Localized edema 07/27/2022 No  Yes Inactive Problems ICD-10 Code Description Active Date Inactive Date L03.115 Cellulitis of right lower limb 10/22/2022 10/22/2022  L97.818 Non-pressure chronic ulcer of other part of right lower leg with other specified severity 03/01/2023 10/22/2022 Resolved Problems Electronic Signature(s) Signed: 05/29/2023 3:08:07 PM By: Duanne Guess MD FACS Entered By: Duanne Guess on 05/29/2023 15:08:06 -------------------------------------------------------------------------------- Progress Note Details Patient Name: Date of Service: [name], [name] 05/29/2023 2:30 PM Medical Record Number: [id number] Patient Account Number: [id number] Date of Birth/Sex: Treating RN: 09/17/1946 (77 y.o. F) Primary Care Provider: Eustaquio Boyden Other Clinician: Referring Provider: Treating Provider/Extender: Priscille Loveless in Treatment: 59 Subjective Chief Complaint Information obtained from Patient Patient presents for treatment of an open ulcer due to venous insufficiency History of Present Illness (HPI) ADMISSION This is a 77 year old woman with a history of chronic venous insufficiency status post saphenous vein ablations in 2010 and 2016. 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 She presents today with an open venous ulcer on her left lower leg. It has been present for a little over 2 weeks. She saw her primary care provider who apparently swabbed the wound and grew out Pseudomonas. She just completed a course of ciprofloxacin for this. ABI was 0.91. She reports that she has had previous issues with ulcers in this same location, stemming back to a punch biopsy taken by a dermatologist many years ago. She has had several skin substitutes applied to the area that have ultimately resulted in healing on prior occasions. She has 2 small ulcers on her left medial lower leg. There is slough accumulation in both of them. The more anterior of the 2 is  quite small and has some soft slough on the surface, underneath which there is good granulation tissue. The more medial wound also has slough accumulation, but the underlying surface is fairly fibrotic and gritty. This is consistent with her provided history of multiple ulcers in the same location. 08/03/2022: The anterior wound is smaller today with just a little bit of slough accumulation. The more medial wound continues to be very sensitive and fibrotic with slough buildup. 08/13/2022: The anterior wound is closed. The more medial wound remains sensitive with a fairly fibrotic surface. There is more granulation tissue filling in, however, and there is less slough than on prior occasions. 08/20/2022: The anterior wound remains closed. The more medial wound has filled with granulation tissue but still has a fair amount of slough on the surface and remains fairly tender. Unfortunately, she has developed 2 small ulcers just proximal to this. The fat layer is exposed in each. She says that over the weekend, she felt a burning sensation in that location. 08/27/2022: The 2 small wounds proximal to the main wound have merged into a single site. The wounds look a little bit dry, but they are quite clean without significant slough accumulation. They remain quite tender. [name], [name] ([id number]) [id number] Page 7 of 12 09/03/2022: All of the wounds have now merged into 1 large wound. There is a strong odor coming from the wound and the surface is not particularly viable. It is extremely painful for her today. 09/10/2022: The wound is less black and purple this week but still does not look particularly viable. The surface is desiccated. The culture that I took last week returned with a polymicrobial population including Pseudomonas. I prescribed both Augmentin and levofloxacin. She has not yet picked up levofloxacin, as her pharmacy only notified her yesterday that it was ready.  We have ordered a Keystone topical compounded antibiotic, but it has not yet come. 09/17/2022: The wound surface is  markedly improved today. There is still an area of grayish-looking muscle but the rest appears significantly more viable. There is still a layer of slough on the surface. She still has a couple days left of oral antibiotic therapy. She has her Jodie Echevaria compounded topical antibiotic with her today. 09/24/2022: She has completed her oral antibiotic therapy. The wound surface is much cleaner today and more viable without any necrotic tissue. It is a bit desiccated, however. 10/01/2022: The moisture balance of the wound has improved. There is a layer of yellow slough on the surface, but beneath this, the wound is more pink. 10/08/2022: The wound is smaller and cleaner today with a layer of slough present. She is having less pain. 12/18; this patient has a new wound on the right lateral ankle which apparently started after a cat scratched her. Secondarily she managed to drop a cake pan on the same area. This is very painful. The original wound was on the left posterior calf we have been using [employer] under an [employer]. The Unna boot fell down and apparently she was in for a nurse visit perhaps last week and that 1 fell down as well This patient has central venous mediated hypertension. For member correctly she has a stent placed in the left common iliac artery by Dr. 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 She has a fair amount of slough and nonviable subcutaneous tissue present on the right ankle. The left posterior calf wound is cleaner than I have seen it to date. Both remain fairly tender. 11/14/2022: Both wounds have substantial slough and  eschar accumulation. They remain extremely tender. 11/21/2022: The wounds are cleaner this week, but still have slough and eschar accumulation. She continues to describe burning pain in both wounds, but upon further questioning, she actually has burning in both of her feet that radiates up her legs and includes to the wounds. 11/28/2022: Both wounds have slough and eschar accumulation, as well as adherent silver alginate that was difficult to remove. She continues to have pain out of proportion to the extent of her wounds. Her PCP did initiate Lyrica which she started taking last night. The dose is quite low, only 25 mg, but apparently there is a plan for upward titration, assuming she tolerates the medication. 12/05/2022: The wounds are both a little bit smaller today, but have significant slough accumulation, as usual. They remain exquisitely tender. She is now taking Lyrica twice a day. 12/19/2022: Both of her wounds look significantly better this week. There is slough buildup on both, but they are smaller. She seems to be getting a good response from Lyrica as she is having much less pain. 12/26/2022: The wound on her right lateral ankle is nearly closed. The wound on her left posterior calf is smaller but still has a lot of slough accumulation. They remain tender. 01/02/2023: The right lateral ankle wound is down to just a couple of millimeters. It is much less tender than on prior occasions. There is minimal slough buildup. The wound on her posterior calf continues to contract, as well. It has thick slough buildup and remains fairly tender. 01/09/2023: The right lateral ankle wound is nearly closed. He remains tender. There is just a little eschar on  the surface. The wound on her posterior calf has improved quite a bit. There is still slough on the surface, but the more proximal area has epithelialized substantially and there is no longer any gray discoloration to her tissue. 3/13; patient presents  for follow-up. Her right lateral ankle wound is almost healed. She has a wound to her left posterior calf with slough and granulation tissue. We have been using Santyl under Unna boots bilaterally to the lower extremities. She has no issues or complaints today. 01/30/2023: The right lateral ankle wound is down to just a pinhole under a layer of eschar. The left posterior calf wound is quite a bit improved since the last time I saw it. There is still a layer of slough on the surface but there has been more epithelialization. 02/07/2023: The right lateral ankle wound still has some eschar on the surface. The left posterior calf wound is about the same size, but with more areas of epithelialization. 02/20/2023: The right lateral ankle wound is healed. The left posterior calf wound is smaller and is essentially flush with the surrounding skin, but the surface is quite dry. Her leg wrap slipped and her calf is quite swollen above the level of the wound. 03/01/2023: The right lateral ankle wound reopened and she also suffered a fall that resulted in a small wound on her right anterior tibial surface. The left posterior calf wound has a much better moisture balance today. There is a layer of slough on the surface. The [employer] worked much better for her as far as compression this week. 03/06/2023: The right lateral ankle wound has a layer of eschar on the surface and is too painful for me to debride. The anterior tibial surface wound is more or less healed; it is a very superficial abrasion at this point. The left posterior calf wound has a layer of slough on the surface. Her edema control is better. 03/13/2023: The right lateral ankle wound has a layer of eschar on the surface. Once this was debrided, it was found to be healed. The anterior tibial surface wound has a layer of stuck silver alginate on the surface. This was removed to reveal a very superficial abrasion but not much change from last week. The left  posterior calf wound is smaller and has its usual layer of accumulated slough. 03/25/2023: The anterior tibial surface wound has eschar and silver alginate stuck to it. Once this was removed, the wound was found to be healed. The left posterior calf wound is roughly the same size and has a little bit thicker layer of accumulated slough. 04/03/2023: She has had more drainage from the posterior calf wound over the last week and the layer of slough is thicker. She also says it is more tender. 04/10/2023: Continued increase in drainage from her wound. The wound remains tender with a layer of slough on the surface. It does not appear as viable this week. She also has erythema, swelling, and warmth in her entire left lower leg extending into the midfoot. 04/17/2023: The wound is about the same size this week. There is fibrotic adherent slough, but the drainage is not as profuse. She is currently taking levofloxacin. She is complaining of an increase in the burning sensation at her wound. 04/24/2023: The wound measured smaller this week and is much cleaner-looking. She completed her course of levofloxacin. Her PCP increased her dose of [name], [name] ([id number]) [id number] Page 8 of 12 Lyrica and she reports that the burning has  stopped. 05/01/2023: The wound is smaller again today and quite clean with only minimal slough on the surface. 05/08/2023: The wound is smaller again today. It is fairly clean, but the surface is a bit fibrotic. Moisture balance is better. Her leg is more swollen and erythematous today, however. 05/15/2023: The wound is about the same size today. The surface has very little slough accumulation, but remains fibrotic. Moisture balance is good. She is responding nicely to the oral Keflex she is taking and her leg is less erythematous and edematous. 05/22/2023: The wound is unchanged in size. There is quite a bit of slough on the surface but the moisture balance is  better. It is still fibrotic, but a little bit less so. Her leg is no longer erythematous, but she does have some persistent edema. 05/29/2023: The wound measured a little bit smaller today. It is dry again and the surface is quite fibrotic. The edema in her leg has improved. Patient History Information obtained from Patient. Family History Cancer - Mother,Paternal Grandparents, Diabetes - Siblings, Heart Disease - Father, Hypertension - Father. Social History Former smoker - quit in 1967. Medical History Eyes Patient has history of Cataracts - L eye Ear/Nose/Mouth/Throat Denies history of Chronic sinus problems/congestion, Middle ear problems Hematologic/Lymphatic Denies history of Anemia, Human Immunodeficiency Virus, Lymphedema Respiratory Patient has history of Asthma Cardiovascular Patient has history of Hypertension Endocrine Denies history of Type I Diabetes, Type II Diabetes Neurologic Patient has history of Neuropathy - Feet and finger Denies history of Seizure Disorder Oncologic Patient has history of Received Chemotherapy - 2012, Received Radiation - 2012 Hospitalization/Surgery History - Lower extremity venography 2020. - Intravascular ultrasound. - peripheral vascular intervention. - melanoma 2011. 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 no acute distress. Vitals Time Taken: 2:37 AM, Height: 63 in, Weight: 200 lbs, BMI: 35.4, Temperature: 98.1 F, Pulse: 73 bpm, Respiratory Rate: 18 breaths/min, Blood Pressure: 145/82 mmHg. Respiratory Normal work of breathing on room air. General Notes: 05/29/2023: The wound measured a little bit smaller today. It is dry again and the surface is quite fibrotic. The edema  in her leg has improved. Integumentary (Hair, Skin) Wound #2 status is Open. Original cause of wound was Blister. The date acquired was: 07/06/2022. The wound has been in treatment 43 weeks. The wound is located on the Left,Posterior Lower Leg. The wound measures 3.7cm length x 2.2cm width x 0.2cm depth; 6.393cm^2 area and 1.279cm^3 volume. There is Fat Layer (Subcutaneous Tissue) exposed. There is no tunneling or undermining noted. There is a medium amount of serosanguineous drainage noted. The wound margin is distinct with the outline attached to the wound base. There is medium (34-66%) red granulation within the wound bed. There is a medium (34-66%) amount of necrotic tissue within the wound bed including Eschar and Adherent Slough. The periwound skin appearance had no abnormalities noted for texture. The periwound skin appearance exhibited: Maceration, Rubor. The periwound skin appearance did not exhibit: Dry/Scaly. Periwound temperature was noted as No Abnormality. The periwound has tenderness on palpation. MIKKA, KISSNER ([id number]) [id number] Page 9 of 12 Assessment Active Problems ICD-10 Non-pressure chronic ulcer of other part of left lower leg with fat layer exposed Varicose veins of bilateral lower extremities with other complications Venous insufficiency (chronic) (peripheral) Polyneuropathy, unspecified Localized edema Procedures Wound #2 Pre-procedure diagnosis of Wound #2 is  a Venous Leg Ulcer located on the Left,Posterior Lower Leg .Severity of Tissue Pre Debridement is: Fat layer exposed. There was a Selective/Open Wound Non-Viable Tissue Debridement with a total area of 6.39 sq cm performed by Duanne Guess, MD. With the following instrument(s): Curette to remove Viable and Non-Viable tissue/material. Material removed includes [facility] after achieving pain control using Lidocaine 4% T opical Solution. No specimens were taken. A time out was  conducted at 15:00, prior to the start of the procedure. A Minimum amount of bleeding was controlled with Pressure. The procedure was tolerated well with a pain level of 0 throughout and a pain level of 0 following the procedure. Post Debridement Measurements: 3.7cm length x 2.2cm width x 0.2cm depth; 1.279cm^3 volume. Character of Wound/Ulcer Post Debridement is improved. Severity of Tissue Post Debridement is: Fat layer exposed. Post procedure Diagnosis Wound #2: Same as Pre-Procedure General Notes: Scribed for Dr Lady Gary by Brenton Grills RN. Pre-procedure diagnosis of Wound #2 is a Venous Leg Ulcer located on the Left,Posterior Lower Leg . There was a Three Layer Compression Therapy Procedure by Brenton Grills, RN. Post procedure Diagnosis Wound #2: Same as Pre-Procedure Notes: Scribed for Dr Lady Gary by Brenton Grills RN. Plan Follow-up Appointments: Return Appointment in 1 week. - Dr. Lady Gary Room 3 05/22/23 NOTE - Switch from HFBR to Endoform starting next visit. Anesthetic: Wound #2 Left,Posterior Lower Leg: (In clinic) Topical Lidocaine 4% applied to wound bed Bathing/ Shower/ Hygiene: May shower with protection but do not get wound dressing(s) wet. Protect dressing(s) with water repellant cover (for example, large plastic bag) or a cast cover and may then take shower. Edema Control - Lymphedema / SCD / Other: Lymphedema Pumps. Use Lymphedema pumps on leg(s) 2-3 times a day for 45-60 minutes. If wearing any wraps or hose, do not remove them. Continue exercising as instructed. - Use x1 per day Avoid standing for long periods of time. Exercise regularly Off-Loading: Wound #2 Left,Posterior Lower Leg: Other: - Elevate legs at heart level or above heart level while sitting. WOUND #2: - Lower Leg Wound Laterality: Left, Posterior Cleanser: Soap and Water 1 x Per Week/30 Days Discharge Instructions: May shower and wash wound with dial antibacterial soap and water prior to dressing  change. Peri-Wound Care: Sween Lotion (Moisturizing lotion) 1 x Per Week/30 Days Discharge Instructions: Apply moisturizing lotion as directed Topical: Gentamicin 1 x Per Week/30 Days Discharge Instructions: As directed by physician Topical: Mupirocin Ointment 1 x Per Week/30 Days Discharge Instructions: Apply Mupirocin (Bactroban) as instructed Topical: Skintegrity Hydrogel 4 (oz) 1 x Per Week/30 Days Discharge Instructions: Apply hydrogel as directed Prim Dressing: Hydrofera Blue Ready Transfer Foam, 2.5x2.5 (in/in) 1 x Per Week/30 Days ary Discharge Instructions: Apply directly to wound bed as directed Secondary Dressing: Optifoam Non-Adhesive Dressing, 4x4 in 1 x Per Week/30 Days Discharge Instructions: Apply over primary dressing as directed. Secondary Dressing: Zetuvit Plus 4x8 in 1 x Per Week/30 Days Discharge Instructions: Apply over primary dressing as directed. Secured With: 16M Medipore Scientist, research (life sciences) Surgical T 2x10 (in/yd) 1 x Per Week/30 Days ape Discharge Instructions: Secure with tape as directed. Com pression Wrap: Unnaboot w/Calamine, 4x10 (in/yd) 1 x Per Week/30 Days Discharge Instructions: Apply Unnaboot as directed. 05/29/2023: The wound measured a little bit smaller today. It is dry again and the surface is quite fibrotic. The edema in her leg has improved. I used a curette to debride the slough from her wound. We will continue the topical gentamicin and mupirocin. Last week, although I  had intended to start [name], [name] ([id number]) [id number] Page 10 of 12 endoform, Hydrofera Blue was accidentally used. We will switch to endoform. I am going to use a small piece of Optifoam of the wound to try and improve the moisture balance at the site. Continue calamine based Radio broadcast assistant. Follow-up in 1 week. Electronic Signature(s) Signed: 05/30/2023 9:55:51 AM By: Duanne Guess MD FACS Signed: 05/31/2023 4:29:32 PM By: Shawn Stall RN, [name] Previous  Signature: 05/29/2023 3:14:38 PM Version By: Duanne Guess MD FACS Entered By: Shawn Stall on 05/30/2023 08:29:32 -------------------------------------------------------------------------------- HxROS Details Patient Name: Date of Service: [name], [name] 05/29/2023 2:30 PM Medical Record Number: [id number] Patient Account Number: [id number] Date of Birth/Sex: Treating RN: February 15, 1946 (77 y.o. F) Primary Care Provider: Eustaquio Boyden Other Clinician: Referring Provider: Treating Provider/Extender: Priscille Loveless in Treatment: 90 Information Obtained From Patient Eyes Medical History: Positive for: Cataracts - L eye Ear/Nose/Mouth/Throat Medical History: Negative for: Chronic sinus problems/congestion; Middle ear problems Hematologic/Lymphatic Medical History: Negative for: Anemia; Human Immunodeficiency Virus; Lymphedema Respiratory Medical History: Positive for: Asthma Past Medical History Notes: OSA on CPAP Cardiovascular Medical History: Positive for: Hypertension Past Medical History Notes: Cronic venous insufficiency 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 Gastrointestinal Medical History: Past Medical History Notes: liver cyst Endocrine Medical History: Negative for: Type I Diabetes; Type II Diabetes Musculoskeletal Medical History: Past Medical History Notes: arthritis DUANE, TRIAS ([id number]) [id number] Page 11 of 12 Neurologic Medical History: Positive for: Neuropathy - Feet and finger Negative for: Seizure Disorder Past Medical History Notes: restless leg syndrome Oncologic Medical History: Positive for: Received Chemotherapy - 2012; Received Radiation - 2012 HBO Extended History Items Eyes: Cataracts Immunizations Pneumococcal Vaccine: Received Pneumococcal Vaccination: Yes Received Pneumococcal Vaccination On or After 60th Birthday: Yes Implantable Devices No  devices added Hospitalization / Surgery History Type of Hospitalization/Surgery Lower extremity venography 2020 Intravascular ultrasound peripheral vascular intervention melanoma 2011 Family and Social History Cancer: Yes - Mother,Paternal Grandparents; Diabetes: Yes - Siblings; Heart Disease: Yes - Father; Hypertension: Yes - Father; Former smoker - quit in [employer]) Signed: 05/29/2023 4:06:34 PM By: Duanne Guess MD FACS Entered By: Duanne Guess on 05/29/2023 15:12:18 -------------------------------------------------------------------------------- SuperBill Details Patient Name: Date of Service: EMMELYN, SCHMALE 05/29/2023 Medical Record Number: [id number] Patient Account Number: [id number] Date of Birth/Sex: Treating RN: 05/29/1946 (77 y.o. F) Primary Care Provider: Eustaquio Boyden Other Clinician: Referring Provider: Treating Provider/Extender: Priscille Loveless in Treatment: 43 Diagnosis Coding ICD-10 Codes Code Description [phone] Non-pressure chronic ulcer of other part of left lower leg with fat layer exposed I83.893 Varicose veins of bilateral lower extremities with other complications I87.2 Venous insufficiency (chronic) (peripheral) G62.9 Polyneuropathy, unspecified R60.0 Localized edema Facility Procedures : CECLIA, [facility] Code: [id number] [facility] ([id number] Description: [phone] - DEBRIDE WOUND 1ST 20 SQ CM OR < ICD-10 Diagnosis Description ) [phone] L97.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 Modifier: [id number] Quantity: 1 7.pdf Page 12 of 12 Physician Procedures : CPT4 Code Description Modifier [phone] 99214 - WC PHYS LEVEL 4 - EST PT 25 ICD-10 Diagnosis Description L97.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d I83.893 Varicose veins of bilateral lower extremities with  other complications I87.2 Venous  insufficiency (chronic) (peripheral) R60.0 Localized edema Quantity: 1 : [id number] - WC PHYS DEBR WO ANESTH 20 SQ CM ICD-10 Diagnosis Description L97.822 Non-pressure chronic ulcer of other part of left  lower leg with fat layer exposed Quantity: 1 Electronic Signature(s) Signed: 05/29/2023 4:06:34 PM By: Duanne Guess MD FACS Signed: 05/29/2023 5:05:32 PM By: Brenton Grills Previous Signature: 05/29/2023 3:15:06 PM Version By: Duanne Guess MD FACS Entered By: Brenton Grills on 05/29/2023 15:15:17

## 2023-06-05 ENCOUNTER — Ambulatory Visit (HOSPITAL_BASED_OUTPATIENT_CLINIC_OR_DEPARTMENT_OTHER): Payer: Medicare Other | Admitting: General Surgery

## 2023-06-06 ENCOUNTER — Ambulatory Visit: Payer: Medicare Other

## 2023-06-06 ENCOUNTER — Encounter (HOSPITAL_BASED_OUTPATIENT_CLINIC_OR_DEPARTMENT_OTHER): Payer: Medicare Other | Attending: General Surgery | Admitting: General Surgery

## 2023-06-06 DIAGNOSIS — I10 Essential (primary) hypertension: Secondary | ICD-10-CM | POA: Insufficient documentation

## 2023-06-06 DIAGNOSIS — Z923 Personal history of irradiation: Secondary | ICD-10-CM | POA: Insufficient documentation

## 2023-06-06 DIAGNOSIS — Z87891 Personal history of nicotine dependence: Secondary | ICD-10-CM | POA: Diagnosis not present

## 2023-06-06 DIAGNOSIS — Z9221 Personal history of antineoplastic chemotherapy: Secondary | ICD-10-CM | POA: Diagnosis not present

## 2023-06-06 DIAGNOSIS — G4733 Obstructive sleep apnea (adult) (pediatric): Secondary | ICD-10-CM | POA: Diagnosis not present

## 2023-06-06 DIAGNOSIS — I83893 Varicose veins of bilateral lower extremities with other complications: Secondary | ICD-10-CM | POA: Diagnosis not present

## 2023-06-06 DIAGNOSIS — R600 Localized edema: Secondary | ICD-10-CM | POA: Diagnosis not present

## 2023-06-06 DIAGNOSIS — I872 Venous insufficiency (chronic) (peripheral): Secondary | ICD-10-CM | POA: Insufficient documentation

## 2023-06-06 DIAGNOSIS — Q969 Turner's syndrome, unspecified: Secondary | ICD-10-CM | POA: Insufficient documentation

## 2023-06-06 DIAGNOSIS — G629 Polyneuropathy, unspecified: Secondary | ICD-10-CM | POA: Insufficient documentation

## 2023-06-06 DIAGNOSIS — J45909 Unspecified asthma, uncomplicated: Secondary | ICD-10-CM | POA: Insufficient documentation

## 2023-06-06 DIAGNOSIS — L97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d: Secondary | ICD-10-CM | POA: Diagnosis not present

## 2023-06-06 NOTE — Progress Notes (Signed)
KYREN, ORAND ([id number]) [id number] Page 1 of 4 Visit Report for 06/06/2023 Arrival Information Details Patient Name: Date of Service: [name], [name] 06/06/2023 3:00 PM Medical Record Number: [id number] Patient Account Number: [id number] Date of Birth/Sex: Treating RN: 20-Sep-1946 (77 y.o. [name] Primary Care Mysty Kielty: Eustaquio Boyden Other Clinician: Referring Jaesean Litzau: Treating Malikai Gut/Extender: Priscille Loveless in Treatment: 85 Visit Information History Since Last Visit Added or deleted any medications: No Patient Arrived: Ambulatory Any new allergies or adverse reactions: No Arrival Time: 15:34 Had a fall or experienced change in No Accompanied By: self activities of daily living that may affect Transfer Assistance: None risk of falls: Patient Identification Verified: Yes Signs or symptoms of abuse/neglect since last visito No Secondary Verification Process Completed: Yes Hospitalized since last visit: No Patient Requires Transmission-Based Precautions: No Implantable device outside of the clinic excluding No Patient Has Alerts: No cellular tissue based products placed in the center since last visit: Has Dressing in Place as Prescribed: Yes Has Compression in Place as Prescribed: Yes Pain Present Now: No Electronic Signature(s) Signed: 06/06/2023 3:38:42 PM By: Samuella Bruin Entered By: Samuella Bruin on 06/06/2023 15:34:34 -------------------------------------------------------------------------------- Compression Therapy Details Patient Name: Date of Service: MAKINZY, LEADINGHAM 06/06/2023 3:00 PM Medical Record Number: [id number] Patient Account Number: [id number] Date of Birth/Sex: Treating RN: 01-22-1946 (77 y.o. [name] Primary Care Neysha Criado: Eustaquio Boyden Other Clinician: Referring Derius Ghosh: Treating Ripken Rekowski/Extender: Priscille Loveless in Treatment:  44 Compression Therapy Performed for Wound Assessment: Wound #2 Left,Posterior Lower Leg Performed By: Clinician Samuella Bruin, RN Compression Type: Double Layer Electronic Signature(s) Signed: 06/06/2023 3:38:42 PM By: Gelene Mink By: Samuella Bruin on 06/06/2023 15:35:08 -------------------------------------------------------------------------------- Encounter Discharge Information Details Patient Name: Date of Service: Amber Buffy. 06/06/2023 3:00 PM Medical Record Number: [id number] Patient Account Number: [id number] [name], [name] ([id number]) [id number] Page 2 of 4 Date of Birth/Sex: Treating RN: 1945-11-22 (77 y.o. [name] Primary Care Shubh Chiara: Other Clinician: Eustaquio Boyden Referring Leven Hoel: Treating Geneal Huebert/Extender: Priscille Loveless in Treatment: 4 Encounter Discharge Information Items Discharge Condition: Stable Ambulatory Status: Ambulatory Discharge Destination: Home Transportation: Private Auto Accompanied By: self Schedule Follow-up Appointment: Yes Clinical Summary of Care: Patient Declined Electronic Signature(s) Signed: 06/06/2023 3:38:42 PM By: Gelene Mink By: Samuella Bruin on 06/06/2023 15:35:38 -------------------------------------------------------------------------------- Patient/Caregiver Education Details Patient Name: Date of Service: [name], [name] 8/1/2024andnbsp3:00 PM Medical Record Number: [id number] Patient Account Number: [id number] Date of Birth/Gender: Treating RN: [birth year] (77 y.o. [name] Primary Care Physician: Eustaquio Boyden Other Clinician: Referring Physician: Treating Physician/Extender: Priscille Loveless in Treatment: 89 Education Assessment Education Provided To: Patient Education Topics Provided Wound/Skin Impairment: Methods: Explain/Verbal Responses: Reinforcements  needed, State content correctly Electronic Signature(s) Signed: 06/06/2023 3:38:42 PM By: Samuella Bruin Entered By: Samuella Bruin on 06/06/2023 15:35:31 -------------------------------------------------------------------------------- Wound Assessment Details Patient Name: Date of Service: [name], [name] 06/06/2023 3:00 PM Medical Record Number: [id number] Patient Account Number: [id number] Date of Birth/Sex: Treating RN: 09-03-1946 (77 y.o. [name] Primary Care Dyllon Henken: Eustaquio Boyden Other Clinician: Referring Lopaka Karge: Treating Kaylia Winborne/Extender: Katharina Caper Weeks in Treatment: 44 Wound Status Wound Number: 2 Primary Venous Leg Ulcer Etiology: Wound Location: Left, Posterior Lower Leg Wound Open Wounding Event: Blister Status: Date Acquired: 07/06/2022 [name], [name] ([id number]) [id number].pdf Page 3 of 4 Date Acquired: 07/06/2022 Comorbid Cataracts, Asthma, Hypertension, Neuropathy, Received Weeks Of Treatment: 44 History: Chemotherapy, Received Radiation Clustered Wound: No Wound Measurements Length: (cm) 3.7 Width: (cm)  2.2 Depth: (cm) 0.2 Area: (cm) 6.393 Volume: (cm) 1.279 % Reduction in Area: 7% % Reduction in Volume: -86.2% Epithelialization: Small (1-33%) Wound Description Classification: Full Thickness Without Exposed Support Structures Wound Margin: Distinct, outline attached Exudate Amount: Medium Exudate Type: Serosanguineous Exudate Color: red, brown Foul Odor After Cleansing: No Slough/Fibrino Yes Wound Bed Granulation Amount: Medium (34-66%) Exposed Structure Granulation Quality: Red Fascia Exposed: No Necrotic Amount: Medium (34-66%) Fat Layer (Subcutaneous Tissue) Exposed: Yes Necrotic Quality: Eschar, Adherent Slough Tendon Exposed: No Muscle Exposed: No Joint Exposed: No Bone Exposed: No Periwound Skin Texture Texture Color No Abnormalities Noted: Yes No Abnormalities  Noted: No Rubor: Yes Moisture No Abnormalities Noted: No Temperature / Pain Dry / Scaly: No Temperature: No Abnormality Maceration: Yes Tenderness on Palpation: Yes Treatment Notes Wound #2 (Lower Leg) Wound Laterality: Left, Posterior Cleanser Soap and Water Discharge Instruction: May shower and wash wound with dial antibacterial soap and water prior to dressing change. Peri-Wound Care Sween Lotion (Moisturizing lotion) Discharge Instruction: Apply moisturizing lotion as directed Topical Gentamicin Discharge Instruction: As directed by physician Mupirocin Ointment Discharge Instruction: Apply Mupirocin (Bactroban) as instructed Skintegrity Hydrogel 4 (oz) Discharge Instruction: Apply hydrogel as directed Primary Dressing Hydrofera Blue Ready Transfer Foam, 2.5x2.5 (in/in) Discharge Instruction: Apply directly to wound bed as directed Secondary Dressing Optifoam Non-Adhesive Dressing, 4x4 in Discharge Instruction: Apply over primary dressing as directed. Zetuvit Plus 4x8 in Discharge Instruction: Apply over primary dressing as directed. Secured With [employer] T 2x10 (in/yd) ape Discharge Instruction: Secure with tape as directed. Compression Wrap Unnaboot w/Calamine, 4x10 (in/yd) Discharge Instruction: Apply Unnaboot as directed. [name], [name] ([id number]) [id number] Page 4 of 4 Compression Stockings Add-Ons Electronic Signature(s) Signed: 06/06/2023 3:38:42 PM By: Samuella Bruin Entered By: Samuella Bruin on 06/06/2023 15:34:55

## 2023-06-06 NOTE — Progress Notes (Signed)
MARIHA, BETTENDORF ([id number]) [id number] Page 1 of 1 Visit Report for 06/06/2023 SuperBill Details Patient Name: Date of Service: [name], [name] 06/06/2023 Medical Record Number: [id number] Patient Account Number: [id number] Date of Birth/Sex: Treating RN: 12/02/1945 (77 y.o. [name] Primary Care Provider: Eustaquio Boyden Other Clinician: Referring Provider: Treating Provider/Extender: Priscille Loveless in Treatment: 44 Diagnosis Coding ICD-10 Codes Code Description [phone] Non-pressure chronic ulcer of other part of left lower leg with fat layer exposed I83.893 Varicose veins of bilateral lower extremities with other complications I87.2 Venous insufficiency (chronic) (peripheral) G62.9 Polyneuropathy, unspecified R60.0 Localized edema Facility Procedures CPT4 Code Description Modifier Quantity [phone] (Facility Use Only) [phone] - APPLY MULTLAY COMPRS LWR LT LEG 1 ICD-10 Diagnosis Description L97.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d Electronic Signature(s) Signed: 06/06/2023 3:38:42 PM By: Samuella Bruin Signed: 06/06/2023 3:45:57 PM By: Duanne Guess MD FACS Entered By: Samuella Bruin on 06/06/2023 15:35:49

## 2023-06-12 ENCOUNTER — Encounter (HOSPITAL_BASED_OUTPATIENT_CLINIC_OR_DEPARTMENT_OTHER): Payer: Medicare Other | Admitting: General Surgery

## 2023-06-12 DIAGNOSIS — G629 Polyneuropathy, unspecified: Secondary | ICD-10-CM | POA: Diagnosis not present

## 2023-06-12 DIAGNOSIS — Z87891 Personal history of nicotine dependence: Secondary | ICD-10-CM | POA: Diagnosis not present

## 2023-06-12 DIAGNOSIS — I83893 Varicose veins of bilateral lower extremities with other complications: Secondary | ICD-10-CM | POA: Diagnosis not present

## 2023-06-12 DIAGNOSIS — R600 Localized edema: Secondary | ICD-10-CM | POA: Diagnosis not present

## 2023-06-12 DIAGNOSIS — L97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d: Secondary | ICD-10-CM | POA: Diagnosis not present

## 2023-06-12 DIAGNOSIS — L97222 Non-pressure chronic ulcer of left calf with fat layer exposed: Secondary | ICD-10-CM | POA: Diagnosis not present

## 2023-06-12 DIAGNOSIS — I872 Venous insufficiency (chronic) (peripheral): Secondary | ICD-10-CM | POA: Diagnosis not present

## 2023-06-13 NOTE — Progress Notes (Signed)
ACACIA, KUSCH ([id number]) [id number] Page 1 of 8 Visit Report for 06/12/2023 Arrival Information Details Patient Name: Date of Service: [name], [name] 06/12/2023 10:45 A M Medical Record Number: [id number] Patient Account Number: [id number] Date of Birth/Sex: Treating RN: Jun 11, 1946 (77 y.o. [name], [name] Primary Care : Eustaquio Boyden Other Clinician: Referring : Treating /Extender: Priscille Loveless in Treatment: 45 Visit Information History Since Last Visit Added or deleted any medications: No Patient Arrived: Ambulatory Any new allergies or adverse reactions: No Arrival Time: 10:56 Had a fall or experienced change in No Accompanied By: self activities of daily living that may affect Transfer Assistance: None risk of falls: Patient Identification Verified: Yes Signs or symptoms of abuse/neglect since last visito No Secondary Verification Process Completed: Yes Hospitalized since last visit: No Patient Requires Transmission-Based Precautions: No Implantable device outside of the clinic excluding No Patient Has Alerts: No cellular tissue based products placed in the center since last visit: Has Dressing in Place as Prescribed: Yes Has Compression in Place as Prescribed: Yes Pain Present Now: Yes Electronic Signature(s) Signed: 06/13/2023 11:59:38 AM By: Samuella Bruin Entered By: Samuella Bruin on 06/12/2023 10:56:41 -------------------------------------------------------------------------------- Compression Therapy Details Patient Name: Date of Service: [name], [name] 06/12/2023 10:45 A M Medical Record Number: [id number] Patient Account Number: [id number] Date of Birth/Sex: Treating RN: [birth year] (77 y.o. [name] Primary Care : Eustaquio Boyden Other Clinician: Referring : Treating /Extender: Priscille Loveless in  Treatment: 45 Compression Therapy Performed for Wound Assessment: Wound #2 Left,Posterior Lower Leg Performed By: Clinician Samuella Bruin, RN Compression Type: Double Layer Post Procedure Diagnosis Same as Pre-procedure Electronic Signature(s) Signed: 06/13/2023 11:59:38 AM By: Gelene Mink By: Samuella Bruin on 06/12/2023 11:10:36 [name] ([id number]) [id number] Page 2 of 8 -------------------------------------------------------------------------------- Encounter Discharge Information Details Patient Name: Date of Service: [name], [name] 06/12/2023 10:45 A M Medical Record Number: [id number] Patient Account Number: [id number] Date of Birth/Sex: Treating RN: Mar 11, 1946 (77 y.o. [name] Primary Care : Eustaquio Boyden Other Clinician: Referring : Treating /Extender: Priscille Loveless in Treatment: 45 Encounter Discharge Information Items Post Procedure Vitals Discharge Condition: Stable Temperature (F): 97.8 Ambulatory Status: Cane Pulse (bpm): 80 Discharge Destination: Home Respiratory Rate (breaths/min): 18 Transportation: Private Auto Blood Pressure (mmHg): 173/90 Accompanied By: self Schedule Follow-up Appointment: Yes Clinical Summary of Care: Patient Declined Electronic Signature(s) Signed: 06/13/2023 11:59:38 AM By: Samuella Bruin Entered By: Samuella Bruin on 06/12/2023 11:26:32 -------------------------------------------------------------------------------- Lower Extremity Assessment Details Patient Name: Date of Service: [name], [name] 06/12/2023 10:45 A M Medical Record Number: [id number] Patient Account Number: [id number] Date of Birth/Sex: Treating RN: [birth year] (77 y.o. [name] Primary Care : Eustaquio Boyden Other Clinician: Referring : Treating /Extender: Katharina Caper Weeks in  Treatment: 45 Edema Assessment Assessed: [name]: No] [Right: No] Edema: [Left: Ye] [Right: s] Calf Left: Right: Point of Measurement: 31 cm From Medial Instep 44.2 cm Ankle Left: Right: Point of Measurement: 9 cm From Medial Instep 25.2 cm Vascular Assessment Pulses: Dorsalis Pedis Palpable: [Left:Yes] Extremity colors, hair growth, and conditions: Extremity Color: [Left:Normal] Hair Growth on Extremity: [Left:Yes] Temperature of Extremity: [Left:Warm] Capillary Refill: [Left:< 3 seconds] Dependent Rubor: [Left:No No] Electronic Signature(s) Signed: 06/13/2023 11:59:38 AM By: Samuella Bruin Entered By: Samuella Bruin on 06/12/2023 11:02:12 [name] ([id number]) [id number] Page 3 of 8 -------------------------------------------------------------------------------- Multi Wound Chart Details Patient Name: Date of Service: [name], [name] 06/12/2023 10:45 A M Medical Record Number: [id number] Patient Account  Number: [id number] Date of Birth/Sex: Treating RN: 1946/02/14 (77 y.o. F) Primary Care : Eustaquio Boyden Other Clinician: Referring : Treating /Extender: Priscille Loveless in Treatment: 45 Vital Signs Height(in): 63 Pulse(bpm): 80 Weight(lbs): 200 Blood Pressure(mmHg): 173/90 Body Mass Index(BMI): 35.4 Temperature(F): 97.8 Respiratory Rate(breaths/min): 18 [2:Photos:] [N/A:N/A] Left, Posterior Lower Leg N/A N/A Wound Location: Blister N/A N/A Wounding Event: Venous Leg Ulcer N/A N/A Primary Etiology: Cataracts, Asthma, Hypertension, N/A N/A Comorbid History: Neuropathy, Received Chemotherapy, Received Radiation 07/06/2022 N/A N/A Date Acquired: 15 N/A N/A Weeks of Treatment: Open N/A N/A Wound Status: No N/A N/A Wound Recurrence: 4.5x2.5x0.1 N/A N/A Measurements L x W x D (cm) 8.836 N/A N/A A (cm) : rea 0.884 N/A N/A Volume (cm) : -28.60% N/A N/A % Reduction in A  rea: -28.70% N/A N/A % Reduction in Volume: Full Thickness Without Exposed N/A N/A Classification: Support Structures Medium N/A N/A Exudate A mount: Serosanguineous N/A N/A Exudate Type: red, brown N/A N/A Exudate Color: Distinct, outline attached N/A N/A Wound Margin: Small (1-33%) N/A N/A Granulation A mount: Red N/A N/A Granulation Quality: Large (67-100%) N/A N/A Necrotic A mount: Eschar, Adherent Slough N/A N/A Necrotic Tissue: Fat Layer (Subcutaneous Tissue): Yes N/A N/A Exposed Structures: Fascia: No Tendon: No Muscle: No Joint: No Bone: No Small (1-33%) N/A N/A Epithelialization: Debridement - Selective/Open Wound N/A N/A Debridement: Pre-procedure Verification/Time Out 11:07 N/A N/A Taken: Lidocaine 4% Topical Solution N/A N/A Pain Control: Slough N/A N/A Tissue Debrided: Non-Viable Tissue N/A N/A Level: 8.83 N/A N/A Debridement A (sq cm): rea Curette N/A N/A Instrument: Minimum N/A N/A Bleeding: Pressure N/A N/A Hemostasis A chieved: Procedure was tolerated well N/A N/A Debridement Treatment Response: 4.5x2.5x0.1 N/A N/A Post Debridement Measurements L x W x D (cm) 0.884 N/A N/A Post Debridement Volume: (cm) Scarring: Yes N/A N/A Periwound Skin TextureWANETA, BOSSERT ([id number]) [id number] Page 4 of 8 Maceration: No N/A N/A Periwound Skin Moisture: Dry/Scaly: No Rubor: Yes N/A N/A Periwound Skin Color: No Abnormality N/A N/A Temperature: Yes N/A N/A Tenderness on Palpation: Compression Therapy N/A N/A Procedures Performed: Debridement Treatment Notes Electronic Signature(s) Signed: 06/12/2023 11:16:35 AM By: Duanne Guess MD FACS Entered By: Duanne Guess on 06/12/2023 11:16:34 -------------------------------------------------------------------------------- Multi-Disciplinary Care Plan Details Patient Name: Date of Service: [name] 06/12/2023 10:45 A M Medical Record Number:  [id number] Patient Account Number: [id number] Date of Birth/Sex: Treating RN: 1946-01-03 (77 y.o. [name] Primary Care : Eustaquio Boyden Other Clinician: Referring : Treating /Extender: Priscille Loveless in Treatment: 45 Multidisciplinary Care Plan reviewed with physician Active Inactive Venous Leg Ulcer Nursing Diagnoses: Knowledge deficit related to disease process and management Potential for venous Insuffiency (use before diagnosis confirmed) Goals: Patient will maintain optimal edema control Date Initiated: 02/07/2023 Target Resolution Date: 07/05/2023 Goal Status: Active Interventions: Assess peripheral edema status every visit. Compression as ordered Treatment Activities: Therapeutic compression applied : 02/07/2023 Notes: Wound/Skin Impairment Nursing Diagnoses: Impaired tissue integrity Goals: Ulcer/skin breakdown will have a volume reduction of 30% by week 4 Date Initiated: 08/03/2022 Date Inactivated: 09/10/2022 Target Resolution Date: 08/31/2022 Unmet Reason: uncontrolled edema, Goal Status: Unmet acute infection Ulcer/skin breakdown will have a volume reduction of 50% by week 8 Date Initiated: 09/10/2022 Target Resolution Date: 07/05/2023 Goal Status: Active Interventions: Assess ulceration(s) every visit Treatment Activities: Skin care regimen initiated : 08/03/2022 WOODROW, NATIVIDAD ([id number]) [id number] Page 5 of 8 Notes: Jodie Echevaria ordered. 10/30 RX Levo started. 09/10/22 Electronic Signature(s) Signed: 06/13/2023 11:59:38 AM By: Gelene Mink  By: Samuella Bruin on 06/12/2023 11:10:10 -------------------------------------------------------------------------------- Pain Assessment Details Patient Name: Date of Service: MARIEN, MAGNER 06/12/2023 10:45 A M Medical Record Number: [id number] Patient Account Number: [id number] Date of Birth/Sex: Treating  RN: [birth year] (77 y.o. [name] Primary Care : Eustaquio Boyden Other Clinician: Referring : Treating /Extender: Priscille Loveless in Treatment: 45 Active Problems Location of Pain Severity and Description of Pain Patient Has Paino Yes Site Locations Pain Location: Pain in Ulcers Duration of the Pain. Constant / Intermittento Intermittent Rate the pain. Current Pain Level: 5 Character of Pain Describe the Pain: Burning Pain Management and Medication Current Pain Management: Medication: Yes Electronic Signature(s) Signed: 06/13/2023 11:59:38 AM By: Samuella Bruin Entered By: Samuella Bruin on 06/12/2023 10:56:54 -------------------------------------------------------------------------------- Patient/Caregiver Education Details Patient Name: Date of Service: [name] 8/7/2024andnbsp10:45 A M Medical Record Number: [id number] Patient Account Number: [id number] Date of Birth/Gender: Treating RN: May 08, 1946 (77 y.o. [name] Primary Care Physician: Eustaquio Boyden Other Clinician: Referring Physician: Treating Physician/Extender: Priscille Loveless in Treatment: [address] [place] ([id number]) [id number] Page 6 of 8 Education Assessment Education Provided To: Patient Education Topics Provided Wound/Skin Impairment: Methods: Explain/Verbal Responses: Reinforcements needed, State content correctly Electronic Signature(s) Signed: 06/13/2023 11:59:38 AM By: Samuella Bruin Entered By: Samuella Bruin on 06/12/2023 11:26:00 -------------------------------------------------------------------------------- Wound Assessment Details Patient Name: Date of Service: NIARI, SAKAL 06/12/2023 10:45 A M Medical Record Number: [id number] Patient Account Number: [id number] Date of Birth/Sex: Treating RN: [birth year] (77 y.o. [name] Primary Care : Eustaquio Boyden Other Clinician: Referring : Treating /Extender: Katharina Caper Weeks in Treatment: 45 Wound Status Wound Number: 2 Primary Venous Leg Ulcer Etiology: Wound Location: Left, Posterior Lower Leg Wound Open Wounding Event: Blister Status: Date Acquired: 07/06/2022 Comorbid Cataracts, Asthma, Hypertension, Neuropathy, Received Weeks Of Treatment: 45 History: Chemotherapy, Received Radiation Clustered Wound: No Photos Wound Measurements Length: (cm) 4.5 Width: (cm) 2.5 Depth: (cm) 0.1 Area: (cm) 8.836 Volume: (cm) 0.884 % Reduction in Area: -28.6% % Reduction in Volume: -28.7% Epithelialization: Small (1-33%) Tunneling: No Undermining: No Wound Description Classification: Full Thickness Without Exposed Support Structures Wound Margin: Distinct, outline attached Exudate Amount: Medium Exudate Type: Serosanguineous Exudate Color: red, brown Foul Odor After Cleansing: No Slough/Fibrino Yes Wound Bed Granulation Amount: Small (1-33%) Exposed Structure Granulation Quality: Red Fascia Exposed: No [name], [name] ([id number]) [id number] Page 7 of 8 Necrotic Amount: Large (67-100%) Fat Layer (Subcutaneous Tissue) Exposed: Yes Necrotic Quality: Eschar, Adherent Slough Tendon Exposed: No Muscle Exposed: No Joint Exposed: No Bone Exposed: No Periwound Skin Texture Texture Color No Abnormalities Noted: Yes No Abnormalities Noted: No Rubor: Yes Moisture No Abnormalities Noted: Yes Temperature / Pain Temperature: No Abnormality Tenderness on Palpation: Yes Treatment Notes Wound #2 (Lower Leg) Wound Laterality: Left, Posterior Cleanser Soap and Water Discharge Instruction: May shower and wash wound with dial antibacterial soap and water prior to dressing change. Peri-Wound Care Ketoconazole Cream 2% Discharge Instruction: Apply Ketoconazole as  directed Triamcinolone 15 (g) Discharge Instruction: Use triamcinolone 15 (g) as directed Zinc Oxide Ointment 30g tube Discharge Instruction: Apply Zinc Oxide to periwound with each dressing change Sween Lotion (Moisturizing lotion) Discharge Instruction: Apply moisturizing lotion as directed Topical Gentamicin Discharge Instruction: As directed by physician Mupirocin Ointment Discharge Instruction: Apply Mupirocin (Bactroban) as instructed Skintegrity Hydrogel 4 (oz) Discharge Instruction: Apply hydrogel as directed Primary Dressing Endoform 2x2 in Discharge Instruction: Moisten with saline Secondary Dressing Optifoam Non-Adhesive Dressing, 4x4 in Discharge Instruction: Apply over primary dressing  as directed. Zetuvit Plus 4x8 in Discharge Instruction: Apply over primary dressing as directed. Secured With [employer] T 2x10 (in/yd) ape Discharge Instruction: Secure with tape as directed. Compression Wrap CoFlex Calamine Unna Boot 4 x 6 (in/yd) Discharge Instruction: Apply Coflex [employer]amine [employer] as directed. Compression Stockings Add-Ons Electronic Signature(s) Signed: 06/13/2023 11:59:38 AM By: Samuella Bruin Entered By: Samuella Bruin on 06/12/2023 11:04:23 [name] ([id number]) [id number] Page 8 of 8 -------------------------------------------------------------------------------- Vitals Details Patient Name: Date of Service: [name], [name] 06/12/2023 10:45 A M Medical Record Number: [id number] Patient Account Number: [id number] Date of Birth/Sex: Treating RN: [birth year] (77 y.o. [name] Primary Care : Eustaquio Boyden Other Clinician: Referring : Treating /Extender: Priscille Loveless in Treatment: 45 Vital Signs Time Taken: 10:57 Temperature (F): 97.8 Height (in): 63 Pulse (bpm): 80 Weight (lbs): 200 Respiratory Rate (breaths/min): 18 Body  Mass Index (BMI): 35.4 Blood Pressure (mmHg): 173/90 Reference Range: 80 - 120 mg / dl Electronic Signature(s) Signed: 06/13/2023 11:59:38 AM By: Samuella Bruin Entered By: Samuella Bruin on 06/12/2023 10:57:26

## 2023-06-13 NOTE — Progress Notes (Signed)
[name] ([id number]) [id number] Page 1 of 12 Visit Report for 06/12/2023 Chief Complaint Document Details Patient Name: Date of Service: [name], [name] 06/12/2023 10:45 A M Medical Record Number: [id number] Patient Account Number: [id number] Date of Birth/Sex: Treating RN: [birth year] (77 y.o. F) Primary Care Provider: Eustaquio [name] Other Clinician: Referring Provider: Treating Provider/Extender: Priscille Loveless in Treatment: 45 Information Obtained from: Patient Chief Complaint Patient presents for treatment of an open ulcer due to venous insufficiency Electronic Signature(s) Signed: 06/12/2023 11:16:54 AM By: Duanne Guess MD FACS Entered By: Duanne Guess on 06/12/2023 11:16:54 -------------------------------------------------------------------------------- Debridement Details Patient Name: Date of Service: [name] 06/12/2023 10:45 A M Medical Record Number: [id number] Patient Account Number: [id number] Date of Birth/Sex: Treating RN: 1946-07-04 (77 y.o. [name] Primary Care Provider: Eustaquio [name] Other Clinician: Referring Provider: Treating Provider/Extender: Priscille Loveless in Treatment: 45 Debridement Performed for Assessment: Wound #2 Left,Posterior Lower Leg Performed By: Physician Duanne Guess, MD Debridement Type: Debridement Severity of Tissue Pre Debridement: Fat layer exposed Level of Consciousness (Pre-procedure): Awake and Alert Pre-procedure Verification/Time Out Yes - 11:07 Taken: Start Time: 11:07 Pain Control: Lidocaine 4% T opical Solution Percent of Wound Bed Debrided: 100% T Area Debrided (cm): otal 8.83 Tissue and other material debrided: Non-Viable, Slough, Slough Level: Non-Viable Tissue Debridement Description: Selective/Open Wound Instrument: Curette Bleeding: Minimum Hemostasis Achieved: Pressure Response to Treatment:  Procedure was tolerated well Level of Consciousness (Post- Awake and Alert procedure): Post Debridement Measurements of Total Wound Length: (cm) 4.5 Width: (cm) 2.5 Depth: (cm) 0.1 Volume: (cm) 0.884 Character of Wound/Ulcer Post Debridement: Improved Severity of Tissue Post Debridement: Fat layer exposed Post Procedure Diagnosis [name], [name] ([id number]) [id number] Page 2 of 12 Same as Pre-procedure Notes scribed for Dr. Lady Gary by Samuella Bruin, RN Electronic Signature(s) Signed: 06/12/2023 12:22:30 PM By: Duanne Guess MD FACS Signed: 06/13/2023 11:59:38 AM By: Gelene Mink By: Samuella Bruin on 06/12/2023 11:10:23 -------------------------------------------------------------------------------- HPI Details Patient Name: Date of Service: [name] 06/12/2023 10:45 A M Medical Record Number: [id number] Patient Account Number: [id number] Date of Birth/Sex: Treating RN: Nov 19, 1945 (77 y.o. F) Primary Care Provider: Eustaquio [name] Other Clinician: Referring Provider: Treating Provider/Extender: Priscille Loveless in Treatment: 45 History of Present Illness HPI Description: ADMISSION This is a 77 year old woman with a history of chronic venous insufficiency status post saphenous vein ablations in 2010 and 2016. 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 She presents today with an open venous ulcer on her left lower leg. It has been present for a little over 2 weeks. She saw her primary care provider who apparently swabbed the wound and grew out Pseudomonas. She just completed a course of ciprofloxacin for this. ABI was 0.91. She reports that she has had previous issues with ulcers in this same location, stemming back to a punch biopsy taken by a dermatologist many years ago. She has had several skin substitutes applied to the area that have ultimately resulted in healing on prior  occasions. She has 2 small ulcers on her left medial lower leg. There is slough accumulation in both of them. The more anterior of the 2 is quite small and has some soft slough on the surface, underneath which there is good granulation tissue. The more medial wound also has slough accumulation, but the underlying surface is fairly fibrotic and gritty. This is consistent with her provided history of multiple ulcers in the same location. 08/03/2022: The anterior wound  is smaller today with just a little bit of slough accumulation. The more medial wound continues to be very sensitive and fibrotic with slough buildup. 08/13/2022: The anterior wound is closed. The more medial wound remains sensitive with a fairly fibrotic surface. There is more granulation tissue filling in, however, and there is less slough than on prior occasions. 08/20/2022: The anterior wound remains closed. The more medial wound has filled with granulation tissue but still has a fair amount of slough on the surface and remains fairly tender. Unfortunately, she has developed 2 small ulcers just proximal to this. The fat layer is exposed in each. She says that over the weekend, she felt a burning sensation in that location. 08/27/2022: The 2 small wounds proximal to the main wound have merged into a single site. The wounds look a little bit dry, but they are quite clean without significant slough accumulation. They remain quite tender. 09/03/2022: All of the wounds have now merged into 1 large wound. There is a strong odor coming from the wound and the surface is not particularly viable. It is extremely painful for her today. 09/10/2022: The wound is less black and purple this week but still does not look particularly viable. The surface is desiccated. The culture that I took last week returned with a polymicrobial population including Pseudomonas. I prescribed both Augmentin and levofloxacin. She has not yet picked up levofloxacin, as  her pharmacy only notified her yesterday that it was ready. We have ordered a Keystone topical compounded antibiotic, but it has not yet come. 09/17/2022: The wound surface is markedly improved today. There is still an area of grayish-looking muscle but the rest appears significantly more viable. There is still a layer of slough on the surface. She still has a couple days left of oral antibiotic therapy. She has her Jodie Echevaria compounded topical antibiotic with her today. 09/24/2022: She has completed her oral antibiotic therapy. The wound surface is much cleaner today and more viable without any necrotic tissue. It is a bit desiccated, however. 10/01/2022: The moisture balance of the wound has improved. There is a layer of yellow slough on the surface, but beneath this, the wound is more pink. 10/08/2022: The wound is smaller and cleaner today with a layer of slough present. She is having less pain. 12/18; this patient has a new wound on the right lateral ankle which apparently started after a cat scratched her. Secondarily she managed to drop a cake pan on the same area. This is very painful. The original wound was on the left posterior calf we have been using [employer] under an [employer]. The Unna boot fell down and apparently she was in for a nurse visit perhaps last week and that 1 fell down as well This patient has central venous mediated hypertension. For member correctly she has a stent placed in the left common iliac artery by Dr. Randie Heinz some years ago [facility] Thurner syndrome] she also has had venous ablations and I believe a history of a DVT The patient has compression pumps at home but she does not use them 10/30/2022: The patient says that her wounds burned all week with the [facility] classic in place. She has a fair amount of slough and nonviable AHMARIA, HINKELMAN ([id number]) [id number] Page 3 of 12 subcutaneous tissue present on the right ankle. The left  posterior calf wound is cleaner than I have seen it to date. Both remain fairly tender. 11/14/2022: Both wounds have substantial slough and eschar accumulation.  They remain extremely tender. 11/21/2022: The wounds are cleaner this week, but still have slough and eschar accumulation. She continues to describe burning pain in both wounds, but upon further questioning, she actually has burning in both of her feet that radiates up her legs and includes to the wounds. 11/28/2022: Both wounds have slough and eschar accumulation, as well as adherent silver alginate that was difficult to remove. She continues to have pain out of proportion to the extent of her wounds. Her PCP did initiate Lyrica which she started taking last night. The dose is quite low, only 25 mg, but apparently there is a plan for upward titration, assuming she tolerates the medication. 12/05/2022: The wounds are both a little bit smaller today, but have significant slough accumulation, as usual. They remain exquisitely tender. She is now taking Lyrica twice a day. 12/19/2022: Both of her wounds look significantly better this week. There is slough buildup on both, but they are smaller. She seems to be getting a good response from Lyrica as she is having much less pain. 12/26/2022: The wound on her right lateral ankle is nearly closed. The wound on her left posterior calf is smaller but still has a lot of slough accumulation. They remain tender. 01/02/2023: The right lateral ankle wound is down to just a couple of millimeters. It is much less tender than on prior occasions. There is minimal slough buildup. The wound on her posterior calf continues to contract, as well. It has thick slough buildup and remains fairly tender. 01/09/2023: The right lateral ankle wound is nearly closed. He remains tender. There is just a little eschar on the surface. The wound on her posterior calf has improved quite a bit. There is still slough on the surface, but the  more proximal area has epithelialized substantially and there is no longer any gray discoloration to her tissue. 3/13; patient presents for follow-up. Her right lateral ankle wound is almost healed. She has a wound to her left posterior calf with slough and granulation tissue. We have been using Santyl under Unna boots bilaterally to the lower extremities. She has no issues or complaints today. 01/30/2023: The right lateral ankle wound is down to just a pinhole under a layer of eschar. The left posterior calf wound is quite a bit improved since the last time I saw it. There is still a layer of slough on the surface but there has been more epithelialization. 02/07/2023: The right lateral ankle wound still has some eschar on the surface. The left posterior calf wound is about the same size, but with more areas of epithelialization. 02/20/2023: The right lateral ankle wound is healed. The left posterior calf wound is smaller and is essentially flush with the surrounding skin, but the surface is quite dry. Her leg wrap slipped and her calf is quite swollen above the level of the wound. 03/01/2023: The right lateral ankle wound reopened and she also suffered a fall that resulted in a small wound on her right anterior tibial surface. The left posterior calf wound has a much better moisture balance today. There is a layer of slough on the surface. The [employer] worked much better for her as far as compression this week. 03/06/2023: The right lateral ankle wound has a layer of eschar on the surface and is too painful for me to debride. The anterior tibial surface wound is more or less healed; it is a very superficial abrasion at this point. The left posterior calf wound has a layer  of slough on the surface. Her edema control is better. 03/13/2023: The right lateral ankle wound has a layer of eschar on the surface. Once this was debrided, it was found to be healed. The anterior tibial surface wound has a layer of  stuck silver alginate on the surface. This was removed to reveal a very superficial abrasion but not much change from last week. The left posterior calf wound is smaller and has its usual layer of accumulated slough. 03/25/2023: The anterior tibial surface wound has eschar and silver alginate stuck to it. Once this was removed, the wound was found to be healed. The left posterior calf wound is roughly the same size and has a little bit thicker layer of accumulated slough. 04/03/2023: She has had more drainage from the posterior calf wound over the last week and the layer of slough is thicker. She also says it is more tender. 04/10/2023: Continued increase in drainage from her wound. The wound remains tender with a layer of slough on the surface. It does not appear as viable this week. She also has erythema, swelling, and warmth in her entire left lower leg extending into the midfoot. 04/17/2023: The wound is about the same size this week. There is fibrotic adherent slough, but the drainage is not as profuse. She is currently taking levofloxacin. She is complaining of an increase in the burning sensation at her wound. 04/24/2023: The wound measured smaller this week and is much cleaner-looking. She completed her course of levofloxacin. Her PCP increased her dose of Lyrica and she reports that the burning has stopped. 05/01/2023: The wound is smaller again today and quite clean with only minimal slough on the surface. 05/08/2023: The wound is smaller again today. It is fairly clean, but the surface is a bit fibrotic. Moisture balance is better. Her leg is more swollen and erythematous today, however. 05/15/2023: The wound is about the same size today. The surface has very little slough accumulation, but remains fibrotic. Moisture balance is good. She is responding nicely to the oral Keflex she is taking and her leg is less erythematous and edematous. 05/22/2023: The wound is unchanged in size. There is quite a  bit of slough on the surface but the moisture balance is better. It is still fibrotic, but a little bit less so. Her leg is no longer erythematous, but she does have some persistent edema. 05/29/2023: The wound measured a little bit smaller today. It is dry again and the surface is quite fibrotic. The edema in her leg has improved. 06/12/2023: The wound measures slightly larger today. The moisture balance has improved markedly. There is soft slough on the wound surface. Edema control is improved. Electronic Signature(s) Signed: 06/12/2023 11:17:50 AM By: Duanne Guess MD FACS Entered By: Duanne Guess on 06/12/2023 11:17:50 [name] ([id number]) [id number] Page 4 of 12 -------------------------------------------------------------------------------- Physical Exam Details Patient Name: Date of Service: [name], [name] 06/12/2023 10:45 A M Medical Record Number: [id number] Patient Account Number: [id number] Date of Birth/Sex: Treating RN: 1945/12/12 (77 y.o. F) Primary Care Provider: Eustaquio [name] Other Clinician: Referring Provider: Treating Provider/Extender: Priscille Loveless in Treatment: 45 Constitutional Hypertensive, asymptomatic. . . . no acute distress. Respiratory Normal work of breathing on room air. Notes 06/12/2023: The wound measures slightly larger today. The moisture balance has improved markedly. There is soft slough on the wound surface. Edema control is improved. Electronic Signature(s) Signed: 06/12/2023 11:19:20 AM By: Duanne Guess MD FACS Entered By: Duanne Guess on 06/12/2023 11:19:20 --------------------------------------------------------------------------------  Physician Orders Details Patient Name: Date of Service: [name], [name] 06/12/2023 10:45 A M Medical Record Number: [id number] Patient Account Number: [id number] Date of Birth/Sex: Treating RN: 01/25/1946 (77 y.o. [name] Primary Care Provider: Eustaquio [name] Other Clinician: Referring Provider: Treating Provider/Extender: Priscille Loveless in Treatment: 90 Verbal / Phone Orders: No Diagnosis Coding ICD-10 Coding Code Description [phone] Non-pressure chronic ulcer of other part of left lower leg with fat layer exposed I83.893 Varicose veins of bilateral lower extremities with other complications I87.2 Venous insufficiency (chronic) (peripheral) G62.9 Polyneuropathy, unspecified R60.0 Localized edema Follow-up Appointments ppointment in 1 week. - Dr. Lady Gary Room 3 Return A Anesthetic Wound #2 Left,Posterior Lower Leg (In clinic) Topical Lidocaine 4% applied to wound bed Bathing/ Shower/ Hygiene May shower with protection but do not get wound dressing(s) wet. Protect dressing(s) with water repellant cover (for example, large plastic bag) or a cast cover and may then take shower. Edema Control - Lymphedema / SCD / Other Left Lower Extremity Lymphedema Pumps. Use Lymphedema pumps on leg(s) 2-3 times a day for 45-60 minutes. If wearing any wraps or hose, do not remove them. Continue exercising as instructed. - Use x1 per day Avoid standing for long periods of time. MILLA, MCPETERS ([id number]) [id number] Page 5 of 12 Exercise regularly Off-Loading Wound #2 Left,Posterior Lower Leg Other: - Elevate legs at heart level or above heart level while sitting. Wound Treatment Wound #2 - Lower Leg Wound Laterality: Left, Posterior Cleanser: Soap and Water 1 x Per Week/30 Days Discharge Instructions: May shower and wash wound with dial antibacterial soap and water prior to dressing change. Peri-Wound Care: Ketoconazole Cream 2% 1 x Per Week/30 Days Discharge Instructions: Apply Ketoconazole as directed Peri-Wound Care: Triamcinolone 15 (g) 1 x Per Week/30 Days Discharge Instructions: Use triamcinolone 15 (g) as directed Peri-Wound Care: Zinc Oxide  Ointment 30g tube 1 x Per Week/30 Days Discharge Instructions: Apply Zinc Oxide to periwound with each dressing change Peri-Wound Care: Sween Lotion (Moisturizing lotion) 1 x Per Week/30 Days Discharge Instructions: Apply moisturizing lotion as directed Topical: Gentamicin 1 x Per Week/30 Days Discharge Instructions: As directed by physician Topical: Mupirocin Ointment 1 x Per Week/30 Days Discharge Instructions: Apply Mupirocin (Bactroban) as instructed Topical: Skintegrity Hydrogel 4 (oz) 1 x Per Week/30 Days Discharge Instructions: Apply hydrogel as directed Prim Dressing: Endoform 2x2 in 1 x Per Week/30 Days ary Discharge Instructions: Moisten with saline Secondary Dressing: Optifoam Non-Adhesive Dressing, 4x4 in 1 x Per Week/30 Days Discharge Instructions: Apply over primary dressing as directed. Secondary Dressing: Zetuvit Plus 4x8 in 1 x Per Week/30 Days Discharge Instructions: Apply over primary dressing as directed. Secured With: 86M Medipore Scientist, research (life sciences) Surgical T 2x10 (in/yd) 1 x Per Week/30 Days ape Discharge Instructions: Secure with tape as directed. Compression Wrap: CoFlex Calamine Unna Boot 4 x 6 (in/yd) 1 x Per Week/30 Days Discharge Instructions: Apply Coflex Calamine [employer] as directed. Patient Medications llergies: sulfa antibiotics, Voltaren, doxycycline, Neoprene, adhesive, latex A Notifications Medication Indication Start End 06/12/2023 lidocaine DOSE topical 4 % cream - cream topical Electronic Signature(s) Signed: 06/12/2023 12:22:30 PM By: Duanne Guess MD FACS Entered By: Duanne Guess on 06/12/2023 11:19:36 -------------------------------------------------------------------------------- Problem List Details Patient Name: Date of Service: [name] 06/12/2023 10:45 A M Medical Record Number: [id number] Patient Account Number: [id number] Date of Birth/Sex: Treating RN: 1945-12-13 (77 y.o. F) Primary Care Provider: Eustaquio [name] Other  Clinician: PRICILA, [name] ([id number]) [id number] Page 6 of 12 Referring Provider: Treating  Provider/Extender: Katharina Caper Weeks in Treatment: 45 Active Problems ICD-10 Encounter Code Description Active Date MDM Diagnosis L97.822 Non-pressure chronic ulcer of other part of left lower leg with fat layer exposed 07/27/2022 No Yes I83.893 Varicose veins of bilateral lower extremities with other complications 07/27/2022 No Yes I87.2 Venous insufficiency (chronic) (peripheral) 07/27/2022 No Yes G62.9 Polyneuropathy, unspecified 07/27/2022 No Yes R60.0 Localized edema 07/27/2022 No Yes Inactive Problems ICD-10 Code Description Active Date Inactive Date L03.115 Cellulitis of right lower limb 10/22/2022 10/22/2022 L97.818 Non-pressure chronic ulcer of other part of right lower leg with other specified severity 03/01/2023 10/22/2022 Resolved Problems Electronic Signature(s) Signed: 06/12/2023 11:16:27 AM By: Duanne Guess MD FACS Entered By: Duanne Guess on 06/12/2023 11:16:27 -------------------------------------------------------------------------------- Progress Note Details Patient Name: Date of Service: [name] 06/12/2023 10:45 A M Medical Record Number: [id number] Patient Account Number: [id number] Date of Birth/Sex: Treating RN: 12-23-1945 (77 y.o. F) Primary Care Provider: Eustaquio [name] Other Clinician: Referring Provider: Treating Provider/Extender: Priscille Loveless in Treatment: 45 Subjective Chief Complaint Information obtained from Patient Patient presents for treatment of an open ulcer due to venous insufficiency History of Present Illness (HPI) ADMISSION This is a 77 year old woman with a history of chronic venous insufficiency status post saphenous vein ablations in 2010 and 2016. 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 She presents today with an open  venous ulcer on her left lower leg. It has been present for a little over 2 weeks. She saw her primary care provider who apparently swabbed the wound and grew out Pseudomonas. She just completed a course of ciprofloxacin for this. ABI [name], [name] ([id number]) [id number] Page 7 of 12 was 0.91. She reports that she has had previous issues with ulcers in this same location, stemming back to a punch biopsy taken by a dermatologist many years ago. She has had several skin substitutes applied to the area that have ultimately resulted in healing on prior occasions. She has 2 small ulcers on her left medial lower leg. There is slough accumulation in both of them. The more anterior of the 2 is quite small and has some soft slough on the surface, underneath which there is good granulation tissue. The more medial wound also has slough accumulation, but the underlying surface is fairly fibrotic and gritty. This is consistent with her provided history of multiple ulcers in the same location. 08/03/2022: The anterior wound is smaller today with just a little bit of slough accumulation. The more medial wound continues to be very sensitive and fibrotic with slough buildup. 08/13/2022: The anterior wound is closed. The more medial wound remains sensitive with a fairly fibrotic surface. There is more granulation tissue filling in, however, and there is less slough than on prior occasions. 08/20/2022: The anterior wound remains closed. The more medial wound has filled with granulation tissue but still has a fair amount of slough on the surface and remains fairly tender. Unfortunately, she has developed 2 small ulcers just proximal to this. The fat layer is exposed in each. She says that over the weekend, she felt a burning sensation in that location. 08/27/2022: The 2 small wounds proximal to the main wound have merged into a single site. The wounds look a little bit dry, but they are quite  clean without significant slough accumulation. They remain quite tender. 09/03/2022: All of the wounds have now merged into 1 large wound. There is a strong odor coming from the wound and the surface is  not particularly viable. It is extremely painful for her today. 09/10/2022: The wound is less black and purple this week but still does not look particularly viable. The surface is desiccated. The culture that I took last week returned with a polymicrobial population including Pseudomonas. I prescribed both Augmentin and levofloxacin. She has not yet picked up levofloxacin, as her pharmacy only notified her yesterday that it was ready. We have ordered a Keystone topical compounded antibiotic, but it has not yet come. 09/17/2022: The wound surface is markedly improved today. There is still an area of grayish-looking muscle but the rest appears significantly more viable. There is still a layer of slough on the surface. She still has a couple days left of oral antibiotic therapy. She has her Jodie Echevaria compounded topical antibiotic with her today. 09/24/2022: She has completed her oral antibiotic therapy. The wound surface is much cleaner today and more viable without any necrotic tissue. It is a bit desiccated, however. 10/01/2022: The moisture balance of the wound has improved. There is a layer of yellow slough on the surface, but beneath this, the wound is more pink. 10/08/2022: The wound is smaller and cleaner today with a layer of slough present. She is having less pain. 12/18; this patient has a new wound on the right lateral ankle which apparently started after a cat scratched her. Secondarily she managed to drop a cake pan on the same area. This is very painful. The original wound was on the left posterior calf we have been using [employer] under an [employer]. The Unna boot fell down and apparently she was in for a nurse visit perhaps last week and that 1 fell down as well This patient has  central venous mediated hypertension. For member correctly she has a stent placed in the left common iliac artery by Dr. 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 She has a fair amount of slough and nonviable subcutaneous tissue present on the right ankle. The left posterior calf wound is cleaner than I have seen it to date. Both remain fairly tender. 11/14/2022: Both wounds have substantial slough and eschar accumulation. They remain extremely tender. 11/21/2022: The wounds are cleaner this week, but still have slough and eschar accumulation. She continues to describe burning pain in both wounds, but upon further questioning, she actually has burning in both of her feet that radiates up her legs and includes to the wounds. 11/28/2022: Both wounds have slough and eschar accumulation, as well as adherent silver alginate that was difficult to remove. She continues to have pain out of proportion to the extent of her wounds. Her PCP did initiate Lyrica which she started taking last night. The dose is quite low, only 25 mg, but apparently there is a plan for upward titration, assuming she tolerates the medication. 12/05/2022: The wounds are both a little bit smaller today, but have significant slough accumulation, as usual. They remain exquisitely tender. She is now taking Lyrica twice a day. 12/19/2022: Both of her wounds look significantly better this week. There is slough buildup on both, but they are smaller. She seems to be getting a good response from Lyrica as she is having much less pain. 12/26/2022: The wound on her right lateral ankle is nearly  closed. The wound on her left posterior calf is smaller but still has a lot of slough accumulation. They remain  tender. 01/02/2023: The right lateral ankle wound is down to just a couple of millimeters. It is much less tender than on prior occasions. There is minimal slough buildup. The wound on her posterior calf continues to contract, as well. It has thick slough buildup and remains fairly tender. 01/09/2023: The right lateral ankle wound is nearly closed. He remains tender. There is just a little eschar on the surface. The wound on her posterior calf has improved quite a bit. There is still slough on the surface, but the more proximal area has epithelialized substantially and there is no longer any gray discoloration to her tissue. 3/13; patient presents for follow-up. Her right lateral ankle wound is almost healed. She has a wound to her left posterior calf with slough and granulation tissue. We have been using Santyl under Unna boots bilaterally to the lower extremities. She has no issues or complaints today. 01/30/2023: The right lateral ankle wound is down to just a pinhole under a layer of eschar. The left posterior calf wound is quite a bit improved since the last time I saw it. There is still a layer of slough on the surface but there has been more epithelialization. 02/07/2023: The right lateral ankle wound still has some eschar on the surface. The left posterior calf wound is about the same size, but with more areas of epithelialization. 02/20/2023: The right lateral ankle wound is healed. The left posterior calf wound is smaller and is essentially flush with the surrounding skin, but the surface is quite dry. Her leg wrap slipped and her calf is quite swollen above the level of the wound. 03/01/2023: The right lateral ankle wound reopened and she also suffered a fall that resulted in a small wound on her right anterior tibial surface. The left posterior calf wound has a much better moisture balance today. There is a layer of slough on the surface. The [employer] worked much better for her as far  as compression this week. [name], BULICK ([id number]) [id number] Page 8 of 12 03/06/2023: The right lateral ankle wound has a layer of eschar on the surface and is too painful for me to debride. The anterior tibial surface wound is more or less healed; it is a very superficial abrasion at this point. The left posterior calf wound has a layer of slough on the surface. Her edema control is better. 03/13/2023: The right lateral ankle wound has a layer of eschar on the surface. Once this was debrided, it was found to be healed. The anterior tibial surface wound has a layer of stuck silver alginate on the surface. This was removed to reveal a very superficial abrasion but not much change from last week. The left posterior calf wound is smaller and has its usual layer of accumulated slough. 03/25/2023: The anterior tibial surface wound has eschar and silver alginate stuck to it. Once this was removed, the wound was found to be healed. The left posterior calf wound is roughly the same size and has a little bit thicker layer of accumulated slough. 04/03/2023: She has had more drainage from the posterior calf wound over the last week and the layer of slough is thicker. She also says it is more tender. 04/10/2023: Continued increase in drainage from her wound. The wound remains tender with a layer of slough on the surface. It does not appear as viable this week.  She also has erythema, swelling, and warmth in her entire left lower leg extending into the midfoot. 04/17/2023: The wound is about the same size this week. There is fibrotic adherent slough, but the drainage is not as profuse. She is currently taking levofloxacin. She is complaining of an increase in the burning sensation at her wound. 04/24/2023: The wound measured smaller this week and is much cleaner-looking. She completed her course of levofloxacin. Her PCP increased her dose of Lyrica and she reports that the burning has  stopped. 05/01/2023: The wound is smaller again today and quite clean with only minimal slough on the surface. 05/08/2023: The wound is smaller again today. It is fairly clean, but the surface is a bit fibrotic. Moisture balance is better. Her leg is more swollen and erythematous today, however. 05/15/2023: The wound is about the same size today. The surface has very little slough accumulation, but remains fibrotic. Moisture balance is good. She is responding nicely to the oral Keflex she is taking and her leg is less erythematous and edematous. 05/22/2023: The wound is unchanged in size. There is quite a bit of slough on the surface but the moisture balance is better. It is still fibrotic, but a little bit less so. Her leg is no longer erythematous, but she does have some persistent edema. 05/29/2023: The wound measured a little bit smaller today. It is dry again and the surface is quite fibrotic. The edema in her leg has improved. 06/12/2023: The wound measures slightly larger today. The moisture balance has improved markedly. There is soft slough on the wound surface. Edema control is improved. Patient History Information obtained from Patient. Family History Cancer - Mother,Paternal Grandparents, Diabetes - Siblings, Heart Disease - Father, Hypertension - Father. Social History Former smoker - quit in 1967. Medical History Eyes Patient has history of Cataracts - L eye Ear/Nose/Mouth/Throat Denies history of Chronic sinus problems/congestion, Middle ear problems Hematologic/Lymphatic Denies history of Anemia, Human Immunodeficiency Virus, Lymphedema Respiratory Patient has history of Asthma Cardiovascular Patient has history of Hypertension Endocrine Denies history of Type I Diabetes, Type II Diabetes Neurologic Patient has history of Neuropathy - Feet and finger Denies history of Seizure Disorder Oncologic Patient has history of Received Chemotherapy - 2012, Received Radiation -  2012 Hospitalization/Surgery History - Lower extremity venography 2020. - Intravascular ultrasound. - peripheral vascular intervention. - melanoma 2011. 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 no acute distress. Vitals Time Taken: 10:57 AM, Height: 63 in, Weight: 200 lbs, BMI: 35.4, Temperature: 97.8 F, Pulse: 80 bpm, Respiratory Rate: 18 breaths/min, Blood Pressure: 173/90 mmHg. Respiratory Normal work of breathing on room air. General Notes: 06/12/2023: The wound measures slightly larger today. The moisture balance has improved markedly. There is soft slough on the wound surface. Edema control is improved. Integumentary (Hair, Skin) Wound #2 status is Open. Original cause of wound was Blister. The date acquired was: 07/06/2022. The wound has been in treatment 45 weeks. The wound is located on the Left,Posterior Lower Leg. The wound measures 4.5cm length x 2.5cm width x 0.1cm depth; 8.836cm^2 area and 0.884cm^3 volume. There is Fat Layer (Subcutaneous Tissue) exposed. There is no tunneling or undermining noted. There is a medium amount of serosanguineous drainage noted. The wound margin is distinct with the outline attached to the  wound base. There is small (1-33%) red granulation within the wound bed. There is a large (67-100%) amount of necrotic tissue within the wound bed including Eschar and Adherent Slough. The periwound skin appearance had no abnormalities noted for texture. The periwound skin appearance had no abnormalities noted for moisture. The periwound skin appearance exhibited: Rubor. Periwound temperature was noted as No Abnormality.  The periwound has tenderness on palpation. Assessment Active Problems ICD-10 Non-pressure chronic ulcer of other part of left lower leg with fat layer exposed Varicose veins of bilateral lower extremities with other complications Venous insufficiency (chronic) (peripheral) Polyneuropathy, unspecified Localized edema Procedures Wound #2 Pre-procedure diagnosis of Wound #2 is a Venous Leg Ulcer located on the Left,Posterior Lower Leg .Severity of Tissue Pre Debridement is: Fat layer exposed. There was a Selective/Open Wound Non-Viable Tissue Debridement with a total area of 8.83 sq cm performed by Duanne Guess, MD. With the following instrument(s): Curette to remove Non-Viable tissue/material. Material removed includes [facility] after achieving pain control using Lidocaine 4% Topical Solution. No specimens were taken. A time out was conducted at 11:07, prior to the start of the procedure. A Minimum amount of bleeding was controlled with Pressure. The procedure was tolerated well. Post Debridement Measurements: 4.5cm length x 2.5cm width x 0.1cm depth; 0.884cm^3 volume. Character of Wound/Ulcer Post Debridement is improved. Severity of Tissue Post Debridement is: Fat layer exposed. Post procedure Diagnosis Wound #2: Same as Pre-Procedure General Notes: scribed for Dr. Lady Gary by Samuella Bruin, RN. Pre-procedure diagnosis of Wound #2 is a Venous Leg Ulcer located on the Left,Posterior Lower Leg . There was a Double Layer Compression Therapy Procedure by Samuella Bruin, RN. Post procedure Diagnosis Wound #2: Same as Pre-Procedure Plan Follow-up Appointments: Return Appointment in 1 week. - Dr. Lady Gary Room 3 Anesthetic: Wound #2 Left,Posterior Lower Leg: (In clinic) Topical Lidocaine 4% applied to wound bed Bathing/ Shower/ Hygiene: May shower with protection but do not get wound dressing(s) wet. Protect dressing(s) with water repellant cover (for example, large plastic bag) or a cast  cover and may then take shower. Edema Control - Lymphedema / SCD / Other: Lymphedema Pumps. Use Lymphedema pumps on leg(s) 2-3 times a day for 45-60 minutes. If wearing any wraps or hose, do not remove them. Continue exercising as instructed. - Use x1 per day Avoid standing for long periods of time. Exercise regularly Off-Loading: Wound #2 Left,Posterior Lower Leg: Other: - Elevate legs at heart level or above heart level while sitting. The following medication(s) was prescribed: lidocaine topical 4 % cream cream topical was prescribed at facility WOUND #2: - Lower Leg Wound Laterality: Left, Posterior Cleanser: Soap and Water 1 x Per Week/30 Days Discharge Instructions: May shower and wash wound with dial antibacterial soap and water prior to dressing change. Peri-Wound Care: Ketoconazole Cream 2% 1 x Per Week/30 Days [name], [name] ([id number]) [id number] Page 10 of 12 Discharge Instructions: Apply Ketoconazole as directed Peri-Wound Care: Triamcinolone 15 (g) 1 x Per Week/30 Days Discharge Instructions: Use triamcinolone 15 (g) as directed Peri-Wound Care: Zinc Oxide Ointment 30g tube 1 x Per Week/30 Days Discharge Instructions: Apply Zinc Oxide to periwound with each dressing change Peri-Wound Care: Sween Lotion (Moisturizing lotion) 1 x Per Week/30 Days Discharge Instructions: Apply moisturizing lotion as directed Topical: Gentamicin 1 x Per Week/30 Days Discharge Instructions: As directed by physician Topical: Mupirocin Ointment 1 x Per Week/30 Days Discharge Instructions: Apply Mupirocin (Bactroban) as instructed Topical: Skintegrity Hydrogel 4 (oz) 1 x Per Week/30 Days Discharge Instructions: Apply  hydrogel as directed Prim Dressing: Endoform 2x2 in 1 x Per Week/30 Days ary Discharge Instructions: Moisten with saline Secondary Dressing: Optifoam Non-Adhesive Dressing, 4x4 in 1 x Per Week/30 Days Discharge Instructions: Apply over primary dressing  as directed. Secondary Dressing: Zetuvit Plus 4x8 in 1 x Per Week/30 Days Discharge Instructions: Apply over primary dressing as directed. Secured With: 28M Medipore Scientist, research (life sciences) Surgical T 2x10 (in/yd) 1 x Per Week/30 Days ape Discharge Instructions: Secure with tape as directed. Com pression Wrap: CoFlex Calamine Unna Boot 4 x 6 (in/yd) 1 x Per Week/30 Days Discharge Instructions: Apply Coflex Calamine [employer] as directed. 06/12/2023: The wound measures slightly larger today. The moisture balance has improved markedly. There is soft slough on the wound surface. Edema control is improved. I used a curette to debride slough from the wound surface. We will continue the mixture of topical gentamicin and mupirocin with endoform. He has been covering the wound with Optifoam which has helped maintain the moisture balance. Continue calamine based Coflex. Follow-up in 1 week. Electronic Signature(s) Signed: 06/12/2023 11:36:37 AM By: Duanne Guess MD FACS Entered By: Duanne Guess on 06/12/2023 11:36:37 -------------------------------------------------------------------------------- HxROS Details Patient Name: Date of Service: [name], [name] 06/12/2023 10:45 A M Medical Record Number: [id number] Patient Account Number: [id number] Date of Birth/Sex: Treating RN: 1946/04/09 (77 y.o. F) Primary Care Provider: Eustaquio [name] Other Clinician: Referring Provider: Treating Provider/Extender: Priscille Loveless in Treatment: 45 Information Obtained From Patient Eyes Medical History: Positive for: Cataracts - L eye Ear/Nose/Mouth/Throat Medical History: Negative for: Chronic sinus problems/congestion; Middle ear problems Hematologic/Lymphatic Medical History: Negative for: Anemia; Human Immunodeficiency Virus; Lymphedema Respiratory Medical History: Positive for: Asthma Past Medical History Notes: OSA on CPAP [name], [name] ([id number])  [id number] Page 11 of 12 Cardiovascular Medical History: Positive for: Hypertension Past Medical History Notes: Cronic venous insufficiency 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 Gastrointestinal Medical History: Past Medical History Notes: liver cyst Endocrine Medical History: Negative for: Type I Diabetes; Type II Diabetes Musculoskeletal Medical History: Past Medical History Notes: arthritis Neurologic Medical History: Positive for: Neuropathy - Feet and finger Negative for: Seizure Disorder Past Medical History Notes: restless leg syndrome Oncologic Medical History: Positive for: Received Chemotherapy - 2012; Received Radiation - 2012 HBO Extended History Items Eyes: Cataracts Immunizations Pneumococcal Vaccine: Received Pneumococcal Vaccination: Yes Received Pneumococcal Vaccination On or After 60th Birthday: Yes Implantable Devices No devices added Hospitalization / Surgery History Type of Hospitalization/Surgery Lower extremity venography 2020 Intravascular ultrasound peripheral vascular intervention melanoma 2011 Family and Social History Cancer: Yes - Mother,Paternal Grandparents; Diabetes: Yes - Siblings; Heart Disease: Yes - Father; Hypertension: Yes - Father; Former smoker - quit in [employer]) Signed: 06/12/2023 12:22:30 PM By: Duanne Guess MD FACS Entered By: Duanne Guess on 06/12/2023 11:17:57 SuperBill Details -------------------------------------------------------------------------------- [name] ([id number]) [id number] Page 12 of 12 Patient Name: Date of Service: [name], [name] 06/12/2023 Medical Record Number: [id number] Patient Account Number: [id number] Date of Birth/Sex: Treating RN: 06/26/1946 (77 y.o. F) Primary Care Provider: Eustaquio [name] Other Clinician: Referring Provider: Treating Provider/Extender: Priscille Loveless in Treatment: 45 Diagnosis Coding ICD-10 Codes Code Description [phone] Non-pressure chronic ulcer of other part of left lower leg with fat layer exposed I83.893 Varicose veins of bilateral lower extremities with other complications I87.2 Venous insufficiency (chronic) (peripheral) G62.9 Polyneuropathy, unspecified R60.0 Localized edema Facility Procedures CPT4 Code Description Modifier Quantity [id number] [phone] - DEBRIDE WOUND 1ST 20 SQ CM OR < 1 ICD-10 Diagnosis Description  Z61.096 Non-pressure chronic ulcer of other part of left lower leg with fat layer exposed Physician Procedures Quantity CPT4 Code Description Modifier [id number] - WC PHYS LEVEL 4 - EST PT 25 1 ICD-10 Diagnosis Description L97.822 Non-pressure chronic ulcer of other part of left lower leg with fat layer exposed I83.893 Varicose veins of bilateral lower extremities with other complications I87.2 Venous insufficiency (chronic) (peripheral) R60.0 Localized edema [id number] - WC PHYS DEBR WO ANESTH 20 SQ CM 1 ICD-10 Diagnosis Description L97.822 Non-pressure chronic ulcer of other part of left lower leg with fat layer exposed Electronic Signature(s) Signed: 06/12/2023 11:37:40 AM By: Duanne Guess MD FACS Entered By: Duanne Guess on 06/12/2023 11:37:39

## 2023-06-19 ENCOUNTER — Encounter (HOSPITAL_BASED_OUTPATIENT_CLINIC_OR_DEPARTMENT_OTHER): Payer: Medicare Other | Admitting: General Surgery

## 2023-06-19 DIAGNOSIS — L97822 Non-pressure chronic ulcer of other part of left lower leg with fat layer exposed: Secondary | ICD-10-CM | POA: Diagnosis not present

## 2023-06-19 DIAGNOSIS — I83893 Varicose veins of bilateral lower extremities with other complications: Secondary | ICD-10-CM | POA: Diagnosis not present

## 2023-06-19 DIAGNOSIS — R600 Localized edema: Secondary | ICD-10-CM | POA: Diagnosis not present

## 2023-06-19 DIAGNOSIS — I872 Venous insufficiency (chronic) (peripheral): Secondary | ICD-10-CM | POA: Diagnosis not present

## 2023-06-19 DIAGNOSIS — L97222 Non-pressure chronic ulcer of left calf with fat layer exposed: Secondary | ICD-10-CM | POA: Diagnosis not present

## 2023-06-19 DIAGNOSIS — G629 Polyneuropathy, unspecified: Secondary | ICD-10-CM | POA: Diagnosis not present

## 2023-06-19 DIAGNOSIS — Z87891 Personal history of nicotine dependence: Secondary | ICD-10-CM | POA: Diagnosis not present

## 2023-06-26 ENCOUNTER — Encounter (HOSPITAL_BASED_OUTPATIENT_CLINIC_OR_DEPARTMENT_OTHER): Payer: Medicare Other | Admitting: General Surgery

## 2023-06-26 DIAGNOSIS — I83893 Varicose veins of bilateral lower extremities with other complications: Secondary | ICD-10-CM | POA: Diagnosis not present

## 2023-06-26 DIAGNOSIS — L97222 Non-pressure chronic ulcer of left calf with fat layer exposed: Secondary | ICD-10-CM | POA: Diagnosis not present

## 2023-06-26 DIAGNOSIS — I872 Venous insufficiency (chronic) (peripheral): Secondary | ICD-10-CM | POA: Diagnosis not present

## 2023-06-26 DIAGNOSIS — G629 Polyneuropathy, unspecified: Secondary | ICD-10-CM | POA: Diagnosis not present

## 2023-06-26 DIAGNOSIS — L97822 Non-pressure chronic ulcer of other part of left lower leg with fat layer exposed: Secondary | ICD-10-CM | POA: Diagnosis not present

## 2023-06-26 DIAGNOSIS — Z87891 Personal history of nicotine dependence: Secondary | ICD-10-CM | POA: Diagnosis not present

## 2023-06-26 DIAGNOSIS — R600 Localized edema: Secondary | ICD-10-CM | POA: Diagnosis not present

## 2023-07-02 NOTE — Progress Notes (Signed)
[name] ([id number]) [id number] Page 1 of 7 Visit Report for 06/19/2023 Arrival Information Details Patient Name: Date of Service: [name], [name] 06/19/2023 3:15 PM Medical Record Number: [id number] Patient Account Number: [id number] Date of Birth/Sex: Treating RN: 04-02-1946 (77 y.o. [name] Primary Care Dharma Pare: Eustaquio Boyden Other Clinician: Referring Cayci Mcnabb: Treating Bayley Yarborough/Extender: Priscille Loveless in Treatment: 41 Visit Information History Since Last Visit All ordered tests and consults were completed: Yes Patient Arrived: Ambulatory Added or deleted any medications: No Arrival Time: 15:53 Any new allergies or adverse reactions: No Accompanied By: self Had a fall or experienced change in No Transfer Assistance: None activities of daily living that may affect Patient Identification Verified: Yes risk of falls: Secondary Verification Process Completed: Yes Signs or symptoms of abuse/neglect since last visito No Patient Requires Transmission-Based Precautions: No Hospitalized since last visit: No Patient Has Alerts: No Implantable device outside of the clinic excluding No cellular tissue based products placed in the center since last visit: Has Dressing in Place as Prescribed: Yes Pain Present Now: No Electronic Signature(s) Signed: 07/02/2023 2:03:55 PM By: Brenton Grills Entered By: Brenton Grills on 06/19/2023 15:54:10 -------------------------------------------------------------------------------- Encounter Discharge Information Details Patient Name: Date of Service: [name] 06/19/2023 3:15 PM Medical Record Number: [id number] Patient Account Number: [id number] Date of Birth/Sex: Treating RN: 1946-03-25 (77 y.o. [name] Primary Care [name]: Eustaquio Boyden Other Clinician: Referring Fernando Stoiber: Treating Jaxsen Bernhart/Extender: Priscille Loveless in  Treatment: [phone] Encounter Discharge Information Items Post Procedure Vitals Discharge Condition: Stable Temperature (F): 98.1 Ambulatory Status: Ambulatory Pulse (bpm): 82 Discharge Destination: Home Respiratory Rate (breaths/min): 18 Transportation: Private Auto Blood Pressure (mmHg): 138/65 Accompanied By: self Schedule Follow-up Appointment: Yes Clinical Summary of Care: Patient Declined Electronic Signature(s) Signed: 07/02/2023 2:03:55 PM By: Brenton Grills Entered By: Brenton Grills on 06/19/2023 16:25:09 [name] ([id number]) [id number] Page 2 of 7 -------------------------------------------------------------------------------- Lower Extremity Assessment Details Patient Name: Date of Service: [name], [name] 06/19/2023 3:15 PM Medical Record Number: [id number] Patient Account Number: [id number] Date of Birth/Sex: Treating RN: [birth year] (77 y.o. [name] Primary Care [name]: Eustaquio Boyden Other Clinician: Referring Beryl Hornberger: Treating [name]/Extender: Priscille Loveless in Treatment: 46 Edema Assessment Assessed: [name]: No] Franne Forts: No] Edema: [Left: Ye] [Right: s] Calf Left: Right: Point of Measurement: 31 cm From Medial Instep 44.2 cm Ankle Left: Right: Point of Measurement: 9 cm From Medial Instep 25.2 cm Vascular Assessment Pulses: Dorsalis Pedis Palpable: [Left:Yes] Extremity colors, hair growth, and conditions: Extremity Color: [Left:Normal] Hair Growth on Extremity: [Left:Yes] Temperature of Extremity: [Left:Warm] Capillary Refill: [Left:< 3 seconds] Dependent Rubor: [Left:No No] Electronic Signature(s) Signed: 07/02/2023 2:03:55 PM By: Brenton Grills Entered By: Brenton Grills on 06/19/2023 15:55:18 -------------------------------------------------------------------------------- Multi Wound Chart Details Patient Name: Date of Service: [name] 06/19/2023 3:15 PM Medical Record  Number: [id number] Patient Account Number: [id number] Date of Birth/Sex: Treating RN: 26-[name]-1946 (77 y.o. F) Primary Care [name]: Eustaquio Boyden Other Clinician: Referring Icis Budreau: Treating [name]/Extender: Priscille Loveless in Treatment: 46 Vital Signs Height(in): 63 Pulse(bpm): 74 Weight(lbs): 200 Blood Pressure(mmHg): 149/84 Body Mass Index(BMI): 35.4 Temperature(F): 98 Respiratory Rate(breaths/min): 18 [2:Photos:] [N/A:N/A] Left, Posterior Lower Leg N/A N/A Wound Location: Blister N/A N/A Wounding Event: Venous Leg Ulcer N/A N/A Primary Etiology: Cataracts, Asthma, Hypertension, N/A N/A Comorbid History: Neuropathy, Received Chemotherapy, Received Radiation 07/06/2022 N/A N/A Date Acquired: 42 N/A N/A Weeks of Treatment: Open N/A N/A Wound Status: No N/A N/A Wound Recurrence: 2.8x2.1x0.2 N/A N/A Measurements  L x W x D (cm) 4.618 N/A N/A A (cm) : rea 0.924 N/A N/A Volume (cm) : 32.80% N/A N/A % Reduction in A rea: -34.50% N/A N/A % Reduction in Volume: Full Thickness Without Exposed N/A N/A Classification: Support Structures Medium N/A N/A Exudate A mount: Serosanguineous N/A N/A Exudate Type: red, brown N/A N/A Exudate Color: Distinct, outline attached N/A N/A Wound Margin: Small (1-33%) N/A N/A Granulation A mount: Red N/A N/A Granulation Quality: Large (67-100%) N/A N/A Necrotic A mount: Eschar, Adherent Slough N/A N/A Necrotic Tissue: Fat Layer (Subcutaneous Tissue): Yes N/A N/A Exposed Structures: Fascia: No Tendon: No Muscle: No Joint: No Bone: No Small (1-33%) N/A N/A Epithelialization: Debridement - Selective/Open Wound N/A N/A Debridement: Pre-procedure Verification/Time Out 16:13 N/A N/A Taken: Lidocaine 4% Topical Solution N/A N/A Pain Control: Necrotic/Eschar, Slough N/A N/A Tissue Debrided: Non-Viable Tissue N/A N/A Level: 4.62 N/A N/A Debridement A (sq cm): rea Curette N/A  N/A Instrument: Minimum N/A N/A Bleeding: Pressure N/A N/A Hemostasis A chieved: 5 N/A N/A Procedural Pain: 0 N/A N/A Post Procedural Pain: Procedure was tolerated well N/A N/A Debridement Treatment Response: 2.8x2.1x0.2 N/A N/A Post Debridement Measurements L x W x D (cm) 0.924 N/A N/A Post Debridement Volume: (cm) Scarring: Yes N/A N/A Periwound Skin Texture: Maceration: No N/A N/A Periwound Skin Moisture: Dry/Scaly: No Rubor: Yes N/A N/A Periwound Skin Color: No Abnormality N/A N/A Temperature: Yes N/A N/A Tenderness on Palpation: Debridement N/A N/A Procedures Performed: Treatment Notes Electronic Signature(s) Signed: 06/19/2023 4:18:24 PM By: Duanne Guess MD FACS Entered By: Duanne Guess on 06/19/2023 16:18:24 -------------------------------------------------------------------------------- Multi-Disciplinary Care Plan Details Patient Name: Date of Service: LAJUNE, [employer] 06/19/2023 3:15 PM [name] ([id number]) [id number] Page 4 of 7 Medical Record Number: [id number] Patient Account Number: [id number] Date of Birth/Sex: Treating RN: 24-Nov-1945 (77 y.o. [name] Primary Care Levi Klaiber: Eustaquio Boyden Other Clinician: Referring Canio Winokur: Treating Gennaro Lizotte/Extender: Priscille Loveless in Treatment: 46 Multidisciplinary Care Plan reviewed with physician Active Inactive Venous Leg Ulcer Nursing Diagnoses: Knowledge deficit related to disease process and management Potential for venous Insuffiency (use before diagnosis confirmed) Goals: Patient will maintain optimal edema control Date Initiated: 02/07/2023 Target Resolution Date: 07/05/2023 Goal Status: Active Interventions: Assess peripheral edema status every visit. Compression as ordered Treatment Activities: Therapeutic compression applied : 02/07/2023 Notes: Wound/Skin Impairment Nursing Diagnoses: Impaired tissue  integrity Goals: Ulcer/skin breakdown will have a volume reduction of 30% by week 4 Date Initiated: 08/03/2022 Date Inactivated: 09/10/2022 Target Resolution Date: 08/31/2022 Unmet Reason: uncontrolled edema, Goal Status: Unmet acute infection Ulcer/skin breakdown will have a volume reduction of 50% by week 8 Date Initiated: 09/10/2022 Target Resolution Date: 07/05/2023 Goal Status: Active Interventions: Assess ulceration(s) every visit Treatment Activities: Skin care regimen initiated : 08/03/2022 Notes: [place]stone ordered. 10/30 RX Levo started. 09/10/22 Electronic Signature(s) Signed: 07/02/2023 2:03:55 PM By: Brenton Grills Entered By: Brenton Grills on 06/19/2023 16:05:41 -------------------------------------------------------------------------------- Pain Assessment Details Patient Name: Date of Service: [name], [name] 06/19/2023 3:15 PM Medical Record Number: [id number] Patient Account Number: [id number] Date of Birth/Sex: Treating RN: May 27, 1946 (77 y.o. [name] Primary Care Lyriq Jarchow: Eustaquio Boyden Other Clinician: Referring Juhi Lagrange: Treating Surya Schroeter/Extender: Priscille Loveless in Treatment: [address] TEASIA, ZANINI ([id number]) [id number] Page 5 of 7 Location of Pain Severity and Description of Pain Patient Has Paino No Site Locations Pain Management and Medication Current Pain Management: Electronic Signature(s) Signed: 07/02/2023 2:03:55 PM By: Brenton Grills Entered By: Brenton Grills on 06/19/2023 15:55:04 -------------------------------------------------------------------------------- Patient/Caregiver  Education Details Patient Name: Date of Service: [name], [name] 8/14/2024andnbsp3:15 PM Medical Record Number: [id number] Patient Account Number: [id number] Date of Birth/Gender: Treating RN: Aug 01, 1946 (77 y.o. [name] Primary Care Physician: Eustaquio Boyden Other  Clinician: Referring Physician: Treating Physician/Extender: Priscille Loveless in Treatment: 30 Education Assessment Education Provided To: Patient Education Topics Provided Wound/Skin Impairment: Methods: Explain/Verbal Responses: State content correctly Electronic Signature(s) Signed: 07/02/2023 2:03:55 PM By: Brenton Grills Entered By: Brenton Grills on 06/19/2023 16:09:41 -------------------------------------------------------------------------------- Wound Assessment Details Patient Name: Date of Service: [name], [name] 06/19/2023 3:15 PM [name] ([id number]) [id number] Page 6 of 7 Medical Record Number: [id number] Patient Account Number: [id number] Date of Birth/Sex: Treating RN: 1946-01-24 (77 y.o. [name] Primary Care Avinash Maltos: Eustaquio Boyden Other Clinician: Referring [name]: Treating Belia Febo/Extender: Katharina Caper Weeks in Treatment: 46 Wound Status Wound Number: 2 Primary Venous Leg Ulcer Etiology: Wound Location: Left, Posterior Lower Leg Wound Open Wounding Event: Blister Status: Date Acquired: 07/06/2022 Comorbid Cataracts, Asthma, Hypertension, Neuropathy, Received Weeks Of Treatment: 46 History: Chemotherapy, Received Radiation Clustered Wound: No Photos Wound Measurements Length: (cm) 2.8 Width: (cm) 2.1 Depth: (cm) 0.2 Area: (cm) 4.618 Volume: (cm) 0.924 % Reduction in Area: 32.8% % Reduction in Volume: -34.5% Epithelialization: Small (1-33%) Tunneling: No Undermining: No Wound Description Classification: Full Thickness Without Exposed Support Structures Wound Margin: Distinct, outline attached Exudate Amount: Medium Exudate Type: Serosanguineous Exudate Color: red, brown Foul Odor After Cleansing: No Slough/Fibrino Yes Wound Bed Granulation Amount: Small (1-33%) Exposed Structure Granulation Quality: Red Fascia Exposed: No Necrotic Amount: Large  (67-100%) Fat Layer (Subcutaneous Tissue) Exposed: Yes Necrotic Quality: Eschar, Adherent Slough Tendon Exposed: No Muscle Exposed: No Joint Exposed: No Bone Exposed: No Periwound Skin Texture Texture Color No Abnormalities Noted: Yes No Abnormalities Noted: No Rubor: Yes Moisture No Abnormalities Noted: Yes Temperature / Pain Temperature: No Abnormality Tenderness on Palpation: Yes Electronic Signature(s) Signed: 07/02/2023 2:03:55 PM By: Brenton Grills Entered By: Brenton Grills on 06/19/2023 16:04:59 Vitals Details -------------------------------------------------------------------------------- [name] ([id number]) [id number] Page 7 of 7 Patient Name: Date of Service: [name], [name] 06/19/2023 3:15 PM Medical Record Number: [id number] Patient Account Number: [id number] Date of Birth/Sex: Treating RN: 08-Feb-1946 (77 y.o. [name] Primary Care Latresha Yahr: Eustaquio Boyden Other Clinician: Referring Kern Gingras: Treating Laporscha Linehan/Extender: Priscille Loveless in Treatment: 46 Vital Signs Time Taken: 15:54 Temperature (F): 98 Height (in): 63 Pulse (bpm): 74 Weight (lbs): 200 Respiratory Rate (breaths/min): 18 Body Mass Index (BMI): 35.4 Blood Pressure (mmHg): 149/84 Reference Range: 80 - 120 mg / dl Electronic Signature(s) Signed: 07/02/2023 2:03:55 PM By: Brenton Grills Entered By: Brenton Grills on 06/19/2023 15:54:56

## 2023-07-02 NOTE — Progress Notes (Signed)
MERCADIES, TRUXILLO ([id number]) [id number] Page 1 of 12 Visit Report for 06/19/2023 Chief Complaint Document Details Patient Name: Date of Service: [name], [name] 06/19/2023 3:15 PM Medical Record Number: [id number] Patient Account Number: [id number] Date of Birth/Sex: Treating RN: 07/08/1946 (77 y.o. F) Primary Care Provider: Eustaquio Boyden Other Clinician: Referring Provider: Treating Provider/Extender: Priscille Loveless in Treatment: 46 Information Obtained from: Patient Chief Complaint Patient presents for treatment of an open ulcer due to venous insufficiency Electronic Signature(s) Signed: 06/19/2023 4:18:36 PM By: Duanne Guess MD FACS Entered By: Duanne Guess on 06/19/2023 16:18:35 -------------------------------------------------------------------------------- Debridement Details Patient Name: Date of Service: [name], [name] 06/19/2023 3:15 PM Medical Record Number: [id number] Patient Account Number: [id number] Date of Birth/Sex: Treating RN: 05-05-1946 (77 y.o. [name] Primary Care Provider: Eustaquio Boyden Other Clinician: Referring Provider: Treating Provider/Extender: Priscille Loveless in Treatment: 46 Debridement Performed for Assessment: Wound #2 Left,Posterior Lower Leg Performed By: Physician Duanne Guess, MD Debridement Type: Debridement Severity of Tissue Pre Debridement: Fat layer exposed Level of Consciousness (Pre-procedure): Awake and Alert Pre-procedure Verification/Time Out Yes - 16:13 Taken: Start Time: 16:15 Pain Control: Lidocaine 4% T opical Solution Percent of Wound Bed Debrided: 100% T Area Debrided (cm): otal 4.62 Tissue and other material debrided: Viable, Non-Viable, Eschar, Slough, Slough Level: Non-Viable Tissue Debridement Description: Selective/Open Wound Instrument: Curette Bleeding: Minimum Hemostasis Achieved: Pressure End Time:  16:19 Procedural Pain: 5 Post Procedural Pain: 0 Response to Treatment: Procedure was tolerated well Level of Consciousness (Post- Awake and Alert procedure): Post Debridement Measurements of Total Wound Length: (cm) 2.8 Width: (cm) 2.1 Depth: (cm) 0.2 Volume: (cm) 0.924 Character of Wound/Ulcer Post Debridement: Improved MILLI, [name] ([id number]) [id number] Page 2 of 12 Severity of Tissue Post Debridement: Fat layer exposed Post Procedure Diagnosis Same as Pre-procedure Notes scribed for Dr. Lady Gary by Brenton Grills, RN Electronic Signature(s) Signed: 06/19/2023 4:39:47 PM By: Duanne Guess MD FACS Signed: 07/02/2023 2:03:55 PM By: Brenton Grills Entered By: Brenton Grills on 06/19/2023 16:14:45 -------------------------------------------------------------------------------- HPI Details Patient Name: Date of Service: [name], [name] 06/19/2023 3:15 PM Medical Record Number: [id number] Patient Account Number: [id number] Date of Birth/Sex: Treating RN: [birth year] (77 y.o. F) Primary Care Provider: Eustaquio Boyden Other Clinician: Referring Provider: Treating Provider/Extender: Priscille Loveless in Treatment: 46 History of Present Illness HPI Description: ADMISSION This is a 77 year old woman with a history of chronic venous insufficiency status post saphenous vein ablations in 2010 and 2016. 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 She presents today with an open venous ulcer on her left lower leg. It has been present for a little over 2 weeks. She saw her primary care provider who apparently swabbed the wound and grew out Pseudomonas. She just completed a course of ciprofloxacin for this. ABI was 0.91. She reports that she has had previous issues with ulcers in this same location, stemming back to a punch biopsy taken by a dermatologist many years ago. She has had several skin substitutes applied  to the area that have ultimately resulted in healing on prior occasions. She has 2 small ulcers on her left medial lower leg. There is slough accumulation in both of them. The more anterior of the 2 is quite small and has some soft slough on the surface, underneath which there is good granulation tissue. The more medial wound also has slough accumulation, but the underlying surface is fairly fibrotic and gritty. This is consistent with her provided history of multiple  ulcers in the same location. 08/03/2022: The anterior wound is smaller today with just a little bit of slough accumulation. The more medial wound continues to be very sensitive and fibrotic with slough buildup. 08/13/2022: The anterior wound is closed. The more medial wound remains sensitive with a fairly fibrotic surface. There is more granulation tissue filling in, however, and there is less slough than on prior occasions. 08/20/2022: The anterior wound remains closed. The more medial wound has filled with granulation tissue but still has a fair amount of slough on the surface and remains fairly tender. Unfortunately, she has developed 2 small ulcers just proximal to this. The fat layer is exposed in each. She says that over the weekend, she felt a burning sensation in that location. 08/27/2022: The 2 small wounds proximal to the main wound have merged into a single site. The wounds look a little bit dry, but they are quite clean without significant slough accumulation. They remain quite tender. 09/03/2022: All of the wounds have now merged into 1 large wound. There is a strong odor coming from the wound and the surface is not particularly viable. It is extremely painful for her today. 09/10/2022: The wound is less black and purple this week but still does not look particularly viable. The surface is desiccated. The culture that I took last week returned with a polymicrobial population including Pseudomonas. I prescribed both Augmentin  and levofloxacin. She has not yet picked up levofloxacin, as her pharmacy only notified her yesterday that it was ready. We have ordered a Keystone topical compounded antibiotic, but it has not yet come. 09/17/2022: The wound surface is markedly improved today. There is still an area of grayish-looking muscle but the rest appears significantly more viable. There is still a layer of slough on the surface. She still has a couple days left of oral antibiotic therapy. She has her Jodie Echevaria compounded topical antibiotic with her today. 09/24/2022: She has completed her oral antibiotic therapy. The wound surface is much cleaner today and more viable without any necrotic tissue. It is a bit desiccated, however. 10/01/2022: The moisture balance of the wound has improved. There is a layer of yellow slough on the surface, but beneath this, the wound is more pink. 10/08/2022: The wound is smaller and cleaner today with a layer of slough present. She is having less pain. 12/18; this patient has a new wound on the right lateral ankle which apparently started after a cat scratched her. Secondarily she managed to drop a cake pan on the same area. This is very painful. The original wound was on the left posterior calf we have been using [employer] under an [employer]. The Unna boot fell down and apparently she was in for a nurse visit perhaps last week and that 1 fell down as well This patient has central venous mediated hypertension. For member correctly she has a stent placed in the left common iliac artery by Dr. Randie Heinz some years ago [facility] Thurner syndrome] she also has had venous ablations and I believe a history of a DVT SHAWNEY, KWIAT ([id number]) [id number] Page 3 of 12 The patient has compression pumps at home but she does not use them 10/30/2022: The patient says that her wounds burned all week with the [facility] classic in place. She has a fair amount of slough and  nonviable subcutaneous tissue present on the right ankle. The left posterior calf wound is cleaner than I have seen it to date. Both remain fairly tender.  11/14/2022: Both wounds have substantial slough and eschar accumulation. They remain extremely tender. 11/21/2022: The wounds are cleaner this week, but still have slough and eschar accumulation. She continues to describe burning pain in both wounds, but upon further questioning, she actually has burning in both of her feet that radiates up her legs and includes to the wounds. 11/28/2022: Both wounds have slough and eschar accumulation, as well as adherent silver alginate that was difficult to remove. She continues to have pain out of proportion to the extent of her wounds. Her PCP did initiate Lyrica which she started taking last night. The dose is quite low, only 25 mg, but apparently there is a plan for upward titration, assuming she tolerates the medication. 12/05/2022: The wounds are both a little bit smaller today, but have significant slough accumulation, as usual. They remain exquisitely tender. She is now taking Lyrica twice a day. 12/19/2022: Both of her wounds look significantly better this week. There is slough buildup on both, but they are smaller. She seems to be getting a good response from Lyrica as she is having much less pain. 12/26/2022: The wound on her right lateral ankle is nearly closed. The wound on her left posterior calf is smaller but still has a lot of slough accumulation. They remain tender. 01/02/2023: The right lateral ankle wound is down to just a couple of millimeters. It is much less tender than on prior occasions. There is minimal slough buildup. The wound on her posterior calf continues to contract, as well. It has thick slough buildup and remains fairly tender. 01/09/2023: The right lateral ankle wound is nearly closed. He remains tender. There is just a little eschar on the surface. The wound on her posterior calf  has improved quite a bit. There is still slough on the surface, but the more proximal area has epithelialized substantially and there is no longer any gray discoloration to her tissue. 3/13; patient presents for follow-up. Her right lateral ankle wound is almost healed. She has a wound to her left posterior calf with slough and granulation tissue. We have been using Santyl under Unna boots bilaterally to the lower extremities. She has no issues or complaints today. 01/30/2023: The right lateral ankle wound is down to just a pinhole under a layer of eschar. The left posterior calf wound is quite a bit improved since the last time I saw it. There is still a layer of slough on the surface but there has been more epithelialization. 02/07/2023: The right lateral ankle wound still has some eschar on the surface. The left posterior calf wound is about the same size, but with more areas of epithelialization. 02/20/2023: The right lateral ankle wound is healed. The left posterior calf wound is smaller and is essentially flush with the surrounding skin, but the surface is quite dry. Her leg wrap slipped and her calf is quite swollen above the level of the wound. 03/01/2023: The right lateral ankle wound reopened and she also suffered a fall that resulted in a small wound on her right anterior tibial surface. The left posterior calf wound has a much better moisture balance today. There is a layer of slough on the surface. The [employer] worked much better for her as far as compression this week. 03/06/2023: The right lateral ankle wound has a layer of eschar on the surface and is too painful for me to debride. The anterior tibial surface wound is more or less healed; it is a very superficial abrasion at this  point. The left posterior calf wound has a layer of slough on the surface. Her edema control is better. 03/13/2023: The right lateral ankle wound has a layer of eschar on the surface. Once this was debrided, it was  found to be healed. The anterior tibial surface wound has a layer of stuck silver alginate on the surface. This was removed to reveal a very superficial abrasion but not much change from last week. The left posterior calf wound is smaller and has its usual layer of accumulated slough. 03/25/2023: The anterior tibial surface wound has eschar and silver alginate stuck to it. Once this was removed, the wound was found to be healed. The left posterior calf wound is roughly the same size and has a little bit thicker layer of accumulated slough. 04/03/2023: She has had more drainage from the posterior calf wound over the last week and the layer of slough is thicker. She also says it is more tender. 04/10/2023: Continued increase in drainage from her wound. The wound remains tender with a layer of slough on the surface. It does not appear as viable this week. She also has erythema, swelling, and warmth in her entire left lower leg extending into the midfoot. 04/17/2023: The wound is about the same size this week. There is fibrotic adherent slough, but the drainage is not as profuse. She is currently taking levofloxacin. She is complaining of an increase in the burning sensation at her wound. 04/24/2023: The wound measured smaller this week and is much cleaner-looking. She completed her course of levofloxacin. Her PCP increased her dose of Lyrica and she reports that the burning has stopped. 05/01/2023: The wound is smaller again today and quite clean with only minimal slough on the surface. 05/08/2023: The wound is smaller again today. It is fairly clean, but the surface is a bit fibrotic. Moisture balance is better. Her leg is more swollen and erythematous today, however. 05/15/2023: The wound is about the same size today. The surface has very little slough accumulation, but remains fibrotic. Moisture balance is good. She is responding nicely to the oral Keflex she is taking and her leg is less erythematous and  edematous. 05/22/2023: The wound is unchanged in size. There is quite a bit of slough on the surface but the moisture balance is better. It is still fibrotic, but a little bit less so. Her leg is no longer erythematous, but she does have some persistent edema. 05/29/2023: The wound measured a little bit smaller today. It is dry again and the surface is quite fibrotic. The edema in her leg has improved. 06/12/2023: The wound measures slightly larger today. The moisture balance has improved markedly. There is soft slough on the wound surface. Edema control is improved. 06/19/2023: The wound measured slightly smaller today. There is a band of epithelium dividing the most proximal portion of the wound from the remainder. There is slough accumulation and moisture balance is improved. Electronic Signature(s) Signed: 06/19/2023 4:19:21 PM By: Duanne Guess MD FACS Entered By: Duanne Guess on 06/19/2023 16:19:21 [name] ([id number]) [id number] Page 4 of 12 -------------------------------------------------------------------------------- Physical Exam Details Patient Name: Date of Service: IMAJEAN, [name] 06/19/2023 3:15 PM Medical Record Number: [id number] Patient Account Number: [id number] Date of Birth/Sex: Treating RN: 29-Aug-1946 (77 y.o. F) Primary Care Provider: Eustaquio Boyden Other Clinician: Referring Provider: Treating Provider/Extender: Priscille Loveless in Treatment: 46 Constitutional Slightly hypertensive. . . . no acute distress. Respiratory Normal work of breathing on room air. Notes 06/19/2023: The  wound measured slightly smaller today. There is a band of epithelium dividing the most proximal portion of the wound from the remainder. There is slough accumulation and moisture balance is improved. Electronic Signature(s) Signed: 06/19/2023 4:20:00 PM By: Duanne Guess MD FACS Entered By: Duanne Guess on 06/19/2023  16:19:59 -------------------------------------------------------------------------------- Physician Orders Details Patient Name: Date of Service: DIVA, MCNEFF 06/19/2023 3:15 PM Medical Record Number: [id number] Patient Account Number: [id number] Date of Birth/Sex: Treating RN: February 05, 1946 (77 y.o. [name] Primary Care Provider: Eustaquio Boyden Other Clinician: Referring Provider: Treating Provider/Extender: Priscille Loveless in Treatment: 10 Verbal / Phone Orders: No Diagnosis Coding ICD-10 Coding Code Description [phone] Non-pressure chronic ulcer of other part of left lower leg with fat layer exposed I83.893 Varicose veins of bilateral lower extremities with other complications I87.2 Venous insufficiency (chronic) (peripheral) G62.9 Polyneuropathy, unspecified R60.0 Localized edema Follow-up Appointments ppointment in 1 week. - Dr. Lady Gary Room 3 Return A Anesthetic Wound #2 Left,Posterior Lower Leg (In clinic) Topical Lidocaine 4% applied to wound bed Bathing/ Shower/ Hygiene May shower with protection but do not get wound dressing(s) wet. Protect dressing(s) with water repellant cover (for example, large plastic bag) or a cast cover and may then take shower. Edema Control - Lymphedema / SCD / Other Left Lower Extremity DORISA, BRISBANE ([id number]) [id number] Page 5 of 12 Lymphedema Pumps. Use Lymphedema pumps on leg(s) 2-3 times a day for 45-60 minutes. If wearing any wraps or hose, do not remove them. Continue exercising as instructed. - Use x1 per day Avoid standing for long periods of time. Exercise regularly Off-Loading Wound #2 Left,Posterior Lower Leg Other: - Elevate legs at heart level or above heart level while sitting. Wound Treatment Wound #2 - Lower Leg Wound Laterality: Left, Posterior Cleanser: Soap and Water 1 x Per Week/30 Days Discharge Instructions: May shower and wash wound with dial  antibacterial soap and water prior to dressing change. Peri-Wound Care: Ketoconazole Cream 2% 1 x Per Week/30 Days Discharge Instructions: Apply Ketoconazole as directed Peri-Wound Care: Triamcinolone 15 (g) 1 x Per Week/30 Days Discharge Instructions: Use triamcinolone 15 (g) as directed Peri-Wound Care: Zinc Oxide Ointment 30g tube 1 x Per Week/30 Days Discharge Instructions: Apply Zinc Oxide to periwound with each dressing change Peri-Wound Care: Sween Lotion (Moisturizing lotion) 1 x Per Week/30 Days Discharge Instructions: Apply moisturizing lotion as directed Topical: Gentamicin 1 x Per Week/30 Days Discharge Instructions: As directed by physician Topical: Mupirocin Ointment 1 x Per Week/30 Days Discharge Instructions: Apply Mupirocin (Bactroban) as instructed Topical: Skintegrity Hydrogel 4 (oz) 1 x Per Week/30 Days Discharge Instructions: Apply hydrogel as directed Prim Dressing: Endoform 2x2 in 1 x Per Week/30 Days ary Discharge Instructions: Moisten with saline Secondary Dressing: Optifoam Non-Adhesive Dressing, 4x4 in 1 x Per Week/30 Days Discharge Instructions: Apply over primary dressing as directed. Secondary Dressing: Zetuvit Plus 4x8 in 1 x Per Week/30 Days Discharge Instructions: Apply over primary dressing as directed. Secured With: 63M Medipore Scientist, research (life sciences) Surgical T 2x10 (in/yd) 1 x Per Week/30 Days ape Discharge Instructions: Secure with tape as directed. Compression Wrap: CoFlex Calamine Unna Boot 4 x 6 (in/yd) 1 x Per Week/30 Days Discharge Instructions: Apply Coflex [employer]amine [employer] as directed. Electronic Signature(s) Signed: 06/19/2023 4:39:47 PM By: Duanne Guess MD FACS Entered By: Duanne Guess on 06/19/2023 16:20:23 -------------------------------------------------------------------------------- Problem List Details Patient Name: Date of Service: [name], [name] 06/19/2023 3:15 PM Medical Record Number: [id number] Patient Account Number:  [id number] Date of Birth/Sex: Treating RN: [birth year] (  77 y.o. [name] Primary Care Provider: Eustaquio Boyden Other Clinician: Referring Provider: Treating Provider/Extender: Priscille Loveless in Treatment: [address] Active Problems ICD-10 [name], [name] ([id number]) [id number] Page 6 of 12 Encounter Code Description Active Date MDM Diagnosis [phone] Non-pressure chronic ulcer of other part of left lower leg with fat layer exposed 07/27/2022 No Yes I83.893 Varicose veins of bilateral lower extremities with other complications 07/27/2022 No Yes I87.2 Venous insufficiency (chronic) (peripheral) 07/27/2022 No Yes G62.9 Polyneuropathy, unspecified 07/27/2022 No Yes R60.0 Localized edema 07/27/2022 No Yes Inactive Problems ICD-10 Code Description Active Date Inactive Date L03.115 Cellulitis of right lower limb 10/22/2022 10/22/2022 L97.818 Non-pressure chronic ulcer of other part of right lower leg with other specified severity 03/01/2023 10/22/2022 Resolved Problems Electronic Signature(s) Signed: 06/19/2023 4:18:17 PM By: Duanne Guess MD FACS Entered By: Duanne Guess on 06/19/2023 16:18:16 -------------------------------------------------------------------------------- Progress Note Details Patient Name: Date of Service: [name] 06/19/2023 3:15 PM Medical Record Number: [id number] Patient Account Number: [id number] Date of Birth/Sex: Treating RN: February 26, 1946 (77 y.o. F) Primary Care Provider: Eustaquio Boyden Other Clinician: Referring Provider: Treating Provider/Extender: Priscille Loveless in Treatment: 46 Subjective Chief Complaint Information obtained from Patient Patient presents for treatment of an open ulcer due to venous insufficiency History of Present Illness (HPI) ADMISSION This is a 77 year old woman with a history of chronic venous insufficiency status post saphenous vein  ablations in 2010 and 2016. 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 She presents today with an open venous ulcer on her left lower leg. It has been present for a little over 2 weeks. She saw her primary care provider who apparently swabbed the wound and grew out Pseudomonas. She just completed a course of ciprofloxacin for this. ABI was 0.91. She reports that she has had previous issues with ulcers in this same location, stemming back to a punch biopsy taken by a dermatologist many years ago. She has had several skin substitutes applied to the area that have ultimately resulted in healing on prior occasions. She has 2 small ulcers on her left medial lower leg. There is slough accumulation in both of them. The more anterior of the 2 is quite small and has some soft slough on the surface, underneath which there is good granulation tissue. The more medial wound also has slough accumulation, but the underlying surface is fairly fibrotic and gritty. This is consistent with her provided history of multiple ulcers in the same location. 08/03/2022: The anterior wound is smaller today with just a little bit of slough accumulation. The more medial wound continues to be very sensitive and fibrotic SANTOYA, [name] ([id number]) [id number] Page 7 of 12 with slough buildup. 08/13/2022: The anterior wound is closed. The more medial wound remains sensitive with a fairly fibrotic surface. There is more granulation tissue filling in, however, and there is less slough than on prior occasions. 08/20/2022: The anterior wound remains closed. The more medial wound has filled with granulation tissue but still has a fair amount of slough on the surface and remains fairly tender. Unfortunately, she has developed 2 small ulcers just proximal to this. The fat layer is exposed in each. She says that over the weekend, she felt a burning sensation in that location. 08/27/2022:  The 2 small wounds proximal to the main wound have merged into a single site. The wounds look a little bit dry, but they are quite clean without significant slough accumulation. They remain quite tender. 09/03/2022: All  of the wounds have now merged into 1 large wound. There is a strong odor coming from the wound and the surface is not particularly viable. It is extremely painful for her today. 09/10/2022: The wound is less black and purple this week but still does not look particularly viable. The surface is desiccated. The culture that I took last week returned with a polymicrobial population including Pseudomonas. I prescribed both Augmentin and levofloxacin. She has not yet picked up levofloxacin, as her pharmacy only notified her yesterday that it was ready. We have ordered a Keystone topical compounded antibiotic, but it has not yet come. 09/17/2022: The wound surface is markedly improved today. There is still an area of grayish-looking muscle but the rest appears significantly more viable. There is still a layer of slough on the surface. She still has a couple days left of oral antibiotic therapy. She has her Jodie Echevaria compounded topical antibiotic with her today. 09/24/2022: She has completed her oral antibiotic therapy. The wound surface is much cleaner today and more viable without any necrotic tissue. It is a bit desiccated, however. 10/01/2022: The moisture balance of the wound has improved. There is a layer of yellow slough on the surface, but beneath this, the wound is more pink. 10/08/2022: The wound is smaller and cleaner today with a layer of slough present. She is having less pain. 12/18; this patient has a new wound on the right lateral ankle which apparently started after a cat scratched her. Secondarily she managed to drop a cake pan on the same area. This is very painful. The original wound was on the left posterior calf we have been using [employer] under an [employer]. The  Unna boot fell down and apparently she was in for a nurse visit perhaps last week and that 1 fell down as well This patient has central venous mediated hypertension. For member correctly she has a stent placed in the left common iliac artery by Dr. Randie Heinz some years ago [facility]er syndrome] she also has had venous ablations and I believe a history of a DVT The patient has compression pumps at home but she does not use them 10/30/2022: The patient says that her wounds burned all week with the [facility] classic in place. She has a fair amount of slough and nonviable subcutaneous tissue present on the right ankle. The left posterior calf wound is cleaner than I have seen it to date. Both remain fairly tender. 11/14/2022: Both wounds have substantial slough and eschar accumulation. They remain extremely tender. 11/21/2022: The wounds are cleaner this week, but still have slough and eschar accumulation. She continues to describe burning pain in both wounds, but upon further questioning, she actually has burning in both of her feet that radiates up her legs and includes to the wounds. 11/28/2022: Both wounds have slough and eschar accumulation, as well as adherent silver alginate that was difficult to remove. She continues to have pain out of proportion to the extent of her wounds. Her PCP did initiate Lyrica which she started taking last night. The dose is quite low, only 25 mg, but apparently there is a plan for upward titration, assuming she tolerates the medication. 12/05/2022: The wounds are both a little bit smaller today, but have significant slough accumulation, as usual. They remain exquisitely tender. She is now taking Lyrica twice a day. 12/19/2022: Both of her wounds look significantly better this week. There is slough buildup on both, but they are smaller. She seems to be  getting a good response from Lyrica as she is having much less pain. 12/26/2022: The wound on her right lateral ankle is  nearly closed. The wound on her left posterior calf is smaller but still has a lot of slough accumulation. They remain tender. 01/02/2023: The right lateral ankle wound is down to just a couple of millimeters. It is much less tender than on prior occasions. There is minimal slough buildup. The wound on her posterior calf continues to contract, as well. It has thick slough buildup and remains fairly tender. 01/09/2023: The right lateral ankle wound is nearly closed. He remains tender. There is just a little eschar on the surface. The wound on her posterior calf has improved quite a bit. There is still slough on the surface, but the more proximal area has epithelialized substantially and there is no longer any gray discoloration to her tissue. 3/13; patient presents for follow-up. Her right lateral ankle wound is almost healed. She has a wound to her left posterior calf with slough and granulation tissue. We have been using Santyl under Unna boots bilaterally to the lower extremities. She has no issues or complaints today. 01/30/2023: The right lateral ankle wound is down to just a pinhole under a layer of eschar. The left posterior calf wound is quite a bit improved since the last time I saw it. There is still a layer of slough on the surface but there has been more epithelialization. 02/07/2023: The right lateral ankle wound still has some eschar on the surface. The left posterior calf wound is about the same size, but with more areas of epithelialization. 02/20/2023: The right lateral ankle wound is healed. The left posterior calf wound is smaller and is essentially flush with the surrounding skin, but the surface is quite dry. Her leg wrap slipped and her calf is quite swollen above the level of the wound. 03/01/2023: The right lateral ankle wound reopened and she also suffered a fall that resulted in a small wound on her right anterior tibial surface. The left posterior calf wound has a much better  moisture balance today. There is a layer of slough on the surface. The [employer] worked much better for her as far as compression this week. 03/06/2023: The right lateral ankle wound has a layer of eschar on the surface and is too painful for me to debride. The anterior tibial surface wound is more or less healed; it is a very superficial abrasion at this point. The left posterior calf wound has a layer of slough on the surface. Her edema control is better. 03/13/2023: The right lateral ankle wound has a layer of eschar on the surface. Once this was debrided, it was found to be healed. The anterior tibial surface wound has a layer of stuck silver alginate on the surface. This was removed to reveal a very superficial abrasion but not much change from last week. The left posterior calf wound is smaller and has its usual layer of accumulated slough. SAQUITA, [name] ([id number]) [id number] Page 8 of 12 03/25/2023: The anterior tibial surface wound has eschar and silver alginate stuck to it. Once this was removed, the wound was found to be healed. The left posterior calf wound is roughly the same size and has a little bit thicker layer of accumulated slough. 04/03/2023: She has had more drainage from the posterior calf wound over the last week and the layer of slough is thicker. She also says it is more tender. 04/10/2023: Continued increase in drainage  from her wound. The wound remains tender with a layer of slough on the surface. It does not appear as viable this week. She also has erythema, swelling, and warmth in her entire left lower leg extending into the midfoot. 04/17/2023: The wound is about the same size this week. There is fibrotic adherent slough, but the drainage is not as profuse. She is currently taking levofloxacin. She is complaining of an increase in the burning sensation at her wound. 04/24/2023: The wound measured smaller this week and is much cleaner-looking. She  completed her course of levofloxacin. Her PCP increased her dose of Lyrica and she reports that the burning has stopped. 05/01/2023: The wound is smaller again today and quite clean with only minimal slough on the surface. 05/08/2023: The wound is smaller again today. It is fairly clean, but the surface is a bit fibrotic. Moisture balance is better. Her leg is more swollen and erythematous today, however. 05/15/2023: The wound is about the same size today. The surface has very little slough accumulation, but remains fibrotic. Moisture balance is good. She is responding nicely to the oral Keflex she is taking and her leg is less erythematous and edematous. 05/22/2023: The wound is unchanged in size. There is quite a bit of slough on the surface but the moisture balance is better. It is still fibrotic, but a little bit less so. Her leg is no longer erythematous, but she does have some persistent edema. 05/29/2023: The wound measured a little bit smaller today. It is dry again and the surface is quite fibrotic. The edema in her leg has improved. 06/12/2023: The wound measures slightly larger today. The moisture balance has improved markedly. There is soft slough on the wound surface. Edema control is improved. 06/19/2023: The wound measured slightly smaller today. There is a band of epithelium dividing the most proximal portion of the wound from the remainder. There is slough accumulation and moisture balance is improved. Patient History Information obtained from Patient. Family History Cancer - Mother,Paternal Grandparents, Diabetes - Siblings, Heart Disease - Father, Hypertension - Father. Social History Former smoker - quit in 1967. Medical History Eyes Patient has history of Cataracts - L eye Ear/Nose/Mouth/Throat Denies history of Chronic sinus problems/congestion, Middle ear problems Hematologic/Lymphatic Denies history of Anemia, Human Immunodeficiency Virus, Lymphedema Respiratory Patient has  history of Asthma Cardiovascular Patient has history of Hypertension Endocrine Denies history of Type I Diabetes, Type II Diabetes Neurologic Patient has history of Neuropathy - Feet and finger Denies history of Seizure Disorder Oncologic Patient has history of Received Chemotherapy - 2012, Received Radiation - 2012 Hospitalization/Surgery History - Lower extremity venography 2020. - Intravascular ultrasound. - peripheral vascular intervention. - melanoma 2011. 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 no acute distress. JARETZY, [name] ([id number]) [id number] Page 9 of 12 Vitals Time Taken: 3:54 PM, Height: 63 in, Weight: 200 lbs, BMI: 35.4, Temperature: 98 F, Pulse: 74 bpm, Respiratory Rate: 18 breaths/min, Blood Pressure: 149/84 mmHg. Respiratory Normal work of breathing on room air. General Notes: 06/19/2023: The wound measured slightly smaller today. There is a band of epithelium dividing the most proximal portion of the wound from the remainder. There is slough accumulation and moisture balance is improved. Integumentary (Hair, Skin) Wound #2 status is Open. Original cause of wound was Blister. The date acquired was: 07/06/2022. The wound has  been in treatment 46 weeks. The wound is located on the Left,Posterior Lower Leg. The wound measures 2.8cm length x 2.1cm width x 0.2cm depth; 4.618cm^2 area and 0.924cm^3 volume. There is Fat Layer (Subcutaneous Tissue) exposed. There is no tunneling or undermining noted. There is a medium amount of serosanguineous drainage noted. The wound margin is distinct with the outline attached to the wound base. There is small (1-33%) red granulation within the wound  bed. There is a large (67-100%) amount of necrotic tissue within the wound bed including Eschar and Adherent Slough. The periwound skin appearance had no abnormalities noted for texture. The periwound skin appearance had no abnormalities noted for moisture. The periwound skin appearance exhibited: Rubor. Periwound temperature was noted as No Abnormality. The periwound has tenderness on palpation. Assessment Active Problems ICD-10 Non-pressure chronic ulcer of other part of left lower leg with fat layer exposed Varicose veins of bilateral lower extremities with other complications Venous insufficiency (chronic) (peripheral) Polyneuropathy, unspecified Localized edema Procedures Wound #2 Pre-procedure diagnosis of Wound #2 is a Venous Leg Ulcer located on the Left,Posterior Lower Leg .Severity of Tissue Pre Debridement is: Fat layer exposed. There was a Selective/Open Wound Non-Viable Tissue Debridement with a total area of 4.62 sq cm performed by Duanne Guess, MD. With the following instrument(s): Curette to remove Viable and Non-Viable tissue/material. Material removed includes Eschar and Slough and after achieving pain control using Lidocaine 4% T opical Solution. No specimens were taken. A time out was conducted at 16:13, prior to the start of the procedure. A Minimum amount of bleeding was controlled with Pressure. The procedure was tolerated well with a pain level of 5 throughout and a pain level of 0 following the procedure. Post Debridement Measurements: 2.8cm length x 2.1cm width x 0.2cm depth; 0.924cm^3 volume. Character of Wound/Ulcer Post Debridement is improved. Severity of Tissue Post Debridement is: Fat layer exposed. Post procedure Diagnosis Wound #2: Same as Pre-Procedure General Notes: scribed for Dr. Lady Gary by Brenton Grills, RN. Plan Follow-up Appointments: Return Appointment in 1 week. - Dr. Lady Gary Room 3 Anesthetic: Wound #2 Left,Posterior Lower Leg: (In clinic)  Topical Lidocaine 4% applied to wound bed Bathing/ Shower/ Hygiene: May shower with protection but do not get wound dressing(s) wet. Protect dressing(s) with water repellant cover (for example, large plastic bag) or a cast cover and may then take shower. Edema Control - Lymphedema / SCD / Other: Lymphedema Pumps. Use Lymphedema pumps on leg(s) 2-3 times a day for 45-60 minutes. If wearing any wraps or hose, do not remove them. Continue exercising as instructed. - Use x1 per day Avoid standing for long periods of time. Exercise regularly Off-Loading: Wound #2 Left,Posterior Lower Leg: Other: - Elevate legs at heart level or above heart level while sitting. WOUND #2: - Lower Leg Wound Laterality: Left, Posterior Cleanser: Soap and Water 1 x Per Week/30 Days Discharge Instructions: May shower and wash wound with dial antibacterial soap and water prior to dressing change. Peri-Wound Care: Ketoconazole Cream 2% 1 x Per Week/30 Days Discharge Instructions: Apply Ketoconazole as directed Peri-Wound Care: Triamcinolone 15 (g) 1 x Per Week/30 Days Discharge Instructions: Use triamcinolone 15 (g) as directed Peri-Wound Care: Zinc Oxide Ointment 30g tube 1 x Per Week/30 Days Discharge Instructions: Apply Zinc Oxide to periwound with each dressing change Peri-Wound Care: Sween Lotion (Moisturizing lotion) 1 x Per Week/30 Days Discharge Instructions: Apply moisturizing lotion as directed Topical: Gentamicin 1 x Per Week/30 Days Discharge Instructions: As directed by physician Topical: Mupirocin Ointment 1  x Per Week/30 Days MARCINA, [name] ([id number]) [id number] Page 10 of 12 Discharge Instructions: Apply Mupirocin (Bactroban) as instructed Topical: Skintegrity Hydrogel 4 (oz) 1 x Per Week/30 Days Discharge Instructions: Apply hydrogel as directed Prim Dressing: Endoform 2x2 in 1 x Per Week/30 Days ary Discharge Instructions: Moisten with saline Secondary Dressing:  Optifoam Non-Adhesive Dressing, 4x4 in 1 x Per Week/30 Days Discharge Instructions: Apply over primary dressing as directed. Secondary Dressing: Zetuvit Plus 4x8 in 1 x Per Week/30 Days Discharge Instructions: Apply over primary dressing as directed. Secured With: 72M Medipore Scientist, research (life sciences) Surgical T 2x10 (in/yd) 1 x Per Week/30 Days ape Discharge Instructions: Secure with tape as directed. Com pression Wrap: CoFlex Calamine Unna Boot 4 x 6 (in/yd) 1 x Per Week/30 Days Discharge Instructions: Apply Coflex Calamine [employer] as directed. 06/19/2023: The wound measured slightly smaller today. There is a band of epithelium dividing the most proximal portion of the wound from the remainder. There is slough accumulation and moisture balance is improved. I used a curette to debride slough from the wound surface. We will continue the mixture of topical gentamicin and mupirocin with endoform moistened with hydrogel. We are covering this with an Optifoam in order to maintain the moisture balance. Continue calamine based Radio broadcast assistant. Follow-up in 1 week. Electronic Signature(s) Signed: 06/19/2023 4:21:09 PM By: Duanne Guess MD FACS Entered By: Duanne Guess on 06/19/2023 16:21:09 -------------------------------------------------------------------------------- HxROS Details Patient Name: Date of Service: [name], [name] 06/19/2023 3:15 PM Medical Record Number: [id number] Patient Account Number: [id number] Date of Birth/Sex: Treating RN: 21-May-1946 (77 y.o. F) Primary Care Provider: Eustaquio Boyden Other Clinician: Referring Provider: Treating Provider/Extender: Priscille Loveless in Treatment: 46 Information Obtained From Patient Eyes Medical History: Positive for: Cataracts - L eye Ear/Nose/Mouth/Throat Medical History: Negative for: Chronic sinus problems/congestion; Middle ear problems Hematologic/Lymphatic Medical History: Negative for: Anemia; Human  Immunodeficiency Virus; Lymphedema Respiratory Medical History: Positive for: Asthma Past Medical History Notes: OSA on CPAP Cardiovascular Medical History: Positive for: Hypertension Past Medical History Notes: Cronic venous insufficiency 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 LORALAI, [name] ([id number]) [id number] Page 11 of 12 Gastrointestinal Medical History: Past Medical History Notes: liver cyst Endocrine Medical History: Negative for: Type I Diabetes; Type II Diabetes Musculoskeletal Medical History: Past Medical History Notes: arthritis Neurologic Medical History: Positive for: Neuropathy - Feet and finger Negative for: Seizure Disorder Past Medical History Notes: restless leg syndrome Oncologic Medical History: Positive for: Received Chemotherapy - 2012; Received Radiation - 2012 HBO Extended History Items Eyes: Cataracts Immunizations Pneumococcal Vaccine: Received Pneumococcal Vaccination: Yes Received Pneumococcal Vaccination On or After 60th Birthday: Yes Implantable Devices No devices added Hospitalization / Surgery History Type of Hospitalization/Surgery Lower extremity venography 2020 Intravascular ultrasound peripheral vascular intervention melanoma 2011 Family and Social History Cancer: Yes - Mother,Paternal Grandparents; Diabetes: Yes - Siblings; Heart Disease: Yes - Father; Hypertension: Yes - Father; Former smoker - quit in [employer]) Signed: 06/19/2023 4:39:47 PM By: Duanne Guess MD FACS Entered By: Duanne Guess on 06/19/2023 16:19:32 -------------------------------------------------------------------------------- SuperBill Details Patient Name: Date of Service: RYANNE, [name] 06/19/2023 Medical Record Number: [id number] Patient Account Number: [id number] Date of Birth/Sex: Treating RN: [name] 01, 1946 (77 y.o. F) Primary Care Provider: Eustaquio Boyden Other  Clinician: Referring Provider: Treating Provider/Extender: Priscille Loveless in Treatment: [address] [place] ([id number]) [id number] Page 12 of 12 Diagnosis Coding ICD-10 Codes Code Description [phone] Non-pressure chronic ulcer of other part of left lower leg with fat layer exposed  Z36.644 Varicose veins of bilateral lower extremities with other complications I87.2 Venous insufficiency (chronic) (peripheral) G62.9 Polyneuropathy, unspecified R60.0 Localized edema Facility Procedures : CPT4 Code: [id number] Description: [phone] - DEBRIDE WOUND 1ST 20 SQ CM OR < ICD-10 Diagnosis Description L97.822 Non-pressure chronic ulcer of other part of left lower leg with fat layer expose Modifier: d Quantity: 1 Physician Procedures : CPT4 Code Description Modifier [id number] - WC PHYS LEVEL 4 - EST PT 25 ICD-10 Diagnosis Description L97.822 Non-pressure chronic ulcer of other part of left lower leg with fat layer exposed I83.893 Varicose veins of bilateral lower extremities with  other complications I87.2 Venous insufficiency (chronic) (peripheral) R60.0 Localized edema Quantity: 1 : [id number] - WC PHYS DEBR WO ANESTH 20 SQ CM ICD-10 Diagnosis Description L97.822 Non-pressure chronic ulcer of other part of left lower leg with fat layer exposed Quantity: 1 Electronic Signature(s) Signed: 06/19/2023 4:21:29 PM By: Duanne Guess MD FACS Entered By: Duanne Guess on 06/19/2023 16:21:29

## 2023-07-02 NOTE — Progress Notes (Signed)
[name], [name] ([id number]) [id number] Page 1 of 8 Visit Report for 06/26/2023 Arrival Information Details Patient Name: Date of Service: [name], [name] 06/26/2023 2:30 PM Medical Record Number: [id number] Patient Account Number: [id number] Date of Birth/Sex: Treating RN: 09-21-1946 (77 y.o. [name] Primary Care [name]: [name] Other Clinician: Referring [name]: Treating [name]/Extender: [name] in Treatment: 27 Visit Information History Since Last Visit All ordered tests and consults were completed: Yes Patient Arrived: Ambulatory Added or deleted any medications: No Arrival Time: 14:45 Any new allergies or adverse reactions: No Accompanied By: self Had a fall or experienced change in No Transfer Assistance: None activities of daily living that may affect Patient Identification Verified: Yes risk of falls: Secondary Verification Process Completed: Yes Signs or symptoms of abuse/neglect since last visito No Patient Requires Transmission-Based Precautions: No Hospitalized since last visit: No Patient Has Alerts: No Implantable device outside of the clinic excluding No cellular tissue based products placed in the center since last visit: Has Dressing in Place as Prescribed: Yes Pain Present Now: No Electronic Signature(s) Signed: 07/02/2023 2:02:59 PM By: [name] Entered By: [name] on 06/26/2023 14:46:18 -------------------------------------------------------------------------------- Encounter Discharge Information Details Patient Name: Date of Service: [name] 06/26/2023 2:30 PM Medical Record Number: [id number] Patient Account Number: [id number] Date of Birth/Sex: Treating RN: 1946-05-21 (77 y.o. [name] Primary Care [name]: [name] Other Clinician: Referring [name]: Treating [name]/Extender: [name] in  Treatment: 19 Encounter Discharge Information Items Post Procedure Vitals Discharge Condition: Stable Temperature (F): 98.3 Ambulatory Status: Ambulatory Pulse (bpm): 78 Discharge Destination: Home Respiratory Rate (breaths/min): 18 Transportation: Private Auto Blood Pressure (mmHg): 136/78 Accompanied By: self Schedule Follow-up Appointment: Yes Clinical Summary of Care: Patient Declined Electronic Signature(s) Signed: 07/02/2023 2:02:59 PM By: [name] Entered By: [name] on 06/26/2023 15:22:38 [name] ([id number]) [id number] Page 2 of 8 -------------------------------------------------------------------------------- Lower Extremity Assessment Details Patient Name: Date of Service: [name], [name] 06/26/2023 2:30 PM Medical Record Number: [id number] Patient Account Number: [id number] Date of Birth/Sex: Treating RN: Sep 04, 1946 (77 y.o. [name] Primary Care [name]: [name] Other Clinician: Referring [name]: Treating Auriah Hollings/Extender: [name] in Treatment: 47 Edema Assessment Assessed: [name]: No] [name]: No] Edema: [Left: Ye] [Right: s] Calf Left: Right: Point of Measurement: 31 cm From Medial Instep 44.2 cm Ankle Left: Right: Point of Measurement: 9 cm From Medial Instep 25.2 cm Vascular Assessment Pulses: Dorsalis Pedis Palpable: [Left:Yes] Extremity colors, hair growth, and conditions: Extremity Color: [Left:Normal] Hair Growth on Extremity: [Left:Yes] Temperature of Extremity: [Left:Warm] Capillary Refill: [Left:< 3 seconds] Dependent Rubor: [Left:No No] Toe Nail Assessment Left: Right: Thick: Yes Discolored: Yes Deformed: Yes Improper Length and Hygiene: Yes Electronic Signature(s) Signed: 07/02/2023 2:02:59 PM By: [name] Entered By: [name] on 06/26/2023  14:56:14 -------------------------------------------------------------------------------- Multi Wound Chart Details Patient Name: Date of Service: [name] 06/26/2023 2:30 PM Medical Record Number: [id number] Patient Account Number: [id number] Date of Birth/Sex: Treating RN: 1946/03/07 (77 y.o. F) Primary Care [name]: [name] Other Clinician: Referring [name]: Treating [name]/Extender: [name] in Treatment: 47 Vital Signs Height(in): 63 Pulse(bpm): 77 Weight(lbs): 200 Blood Pressure(mmHg): 138/77 Body Mass Index(BMI): 35.4 [name], [name] ([id number]) [id number] Page 3 of 8 Temperature(F): 98.3 Respiratory Rate(breaths/min): 18 [2:Photos:] [N/A:N/A] Left, Posterior Lower Leg N/A N/A Wound Location: Blister N/A N/A Wounding Event: Venous Leg Ulcer N/A N/A Primary Etiology: Cataracts, Asthma, Hypertension, N/A N/A Comorbid History: Neuropathy, Received Chemotherapy, Received Radiation  07/06/2022 N/A N/A Date Acquired: 80 N/A N/A Weeks of Treatment: Open N/A N/A Wound Status: No N/A N/A Wound Recurrence: 2x1.1x0.2 N/A N/A Measurements L x W x D (cm) 1.728 N/A N/A A (cm) : rea 0.346 N/A N/A Volume (cm) : 74.90% N/A N/A % Reduction in A rea: 49.60% N/A N/A % Reduction in Volume: Full Thickness Without Exposed N/A N/A Classification: Support Structures Medium N/A N/A Exudate A mount: Serosanguineous N/A N/A Exudate Type: red, brown N/A N/A Exudate Color: Distinct, outline attached N/A N/A Wound Margin: Small (1-33%) N/A N/A Granulation A mount: Red N/A N/A Granulation Quality: Large (67-100%) N/A N/A Necrotic A mount: Eschar, Adherent Slough N/A N/A Necrotic Tissue: Fat Layer (Subcutaneous Tissue): Yes N/A N/A Exposed Structures: Fascia: No Tendon: No Muscle: No Joint: No Bone: No Small (1-33%) N/A N/A Epithelialization: Debridement - Selective/Open Wound N/A  N/A Debridement: Pre-procedure Verification/Time Out 15:07 N/A N/A Taken: Lidocaine 4% Topical Solution N/A N/A Pain Control: Slough N/A N/A Tissue Debrided: Non-Viable Tissue N/A N/A Level: 1.73 N/A N/A Debridement A (sq cm): rea Curette N/A N/A Instrument: Minimum N/A N/A Bleeding: Pressure N/A N/A Hemostasis A chieved: Procedure was tolerated well N/A N/A Debridement Treatment Response: 2x1.1x0.2 N/A N/A Post Debridement Measurements L x W x D (cm) 0.346 N/A N/A Post Debridement Volume: (cm) Scarring: Yes N/A N/A Periwound Skin Texture: Maceration: No N/A N/A Periwound Skin Moisture: Dry/Scaly: No Rubor: Yes N/A N/A Periwound Skin Color: No Abnormality N/A N/A Temperature: Yes N/A N/A Tenderness on Palpation: Debridement N/A N/A Procedures Performed: Treatment Notes Electronic Signature(s) Signed: 06/26/2023 3:17:39 PM By: Duanne Guess MD FACS Entered By: Duanne Guess on 06/26/2023 15:17:39 [name], [name] ([id number]) [id number] Page 4 of 8 -------------------------------------------------------------------------------- Multi-Disciplinary Care Plan Details Patient Name: Date of Service: [name], [name] 06/26/2023 2:30 PM Medical Record Number: [id number] Patient Account Number: [id number] Date of Birth/Sex: Treating RN: 11-13-1945 (77 y.o. [name] Primary Care Onda Kattner: [name] Other Clinician: Referring Jaquesha Boroff: Treating Enjoli Tidd/Extender: [name] in Treatment: 42 Multidisciplinary Care Plan reviewed with physician Active Inactive Venous Leg Ulcer Nursing Diagnoses: Knowledge deficit related to disease process and management Potential for venous Insuffiency (use before diagnosis confirmed) Goals: Patient will maintain optimal edema control Date Initiated: 02/07/2023 Target Resolution Date: 07/05/2023 Goal Status: Active Interventions: Assess peripheral edema status  every visit. Compression as ordered Treatment Activities: Therapeutic compression applied : 02/07/2023 Notes: Wound/Skin Impairment Nursing Diagnoses: Impaired tissue integrity Goals: Ulcer/skin breakdown will have a volume reduction of 30% by week 4 Date Initiated: 08/03/2022 Date Inactivated: 09/10/2022 Target Resolution Date: 08/31/2022 Unmet Reason: uncontrolled edema, Goal Status: Unmet acute infection Ulcer/skin breakdown will have a volume reduction of 50% by week 8 Date Initiated: 09/10/2022 Target Resolution Date: 07/05/2023 Goal Status: Active Interventions: Assess ulceration(s) every visit Treatment Activities: Skin care regimen initiated : 08/03/2022 Notes: Keystone ordered. 10/30 RX Levo started. 09/10/22 Electronic Signature(s) Signed: 07/02/2023 2:02:59 PM By: [name] Entered By: [name] on 06/26/2023 14:58:39 Pain Assessment Details -------------------------------------------------------------------------------- [name] ([id number]) [id number] Page 5 of 8 Patient Name: Date of Service: [name], [name] 06/26/2023 2:30 PM Medical Record Number: [id number] Patient Account Number: [id number] Date of Birth/Sex: Treating RN: April 15, 1946 (77 y.o. [name] Primary Care Oliviarose Punch: [name] Other Clinician: Referring Lucca Ballo: Treating Elmore Hyslop/Extender: [name] in Treatment: 19 Active Problems Location of Pain Severity and Description of Pain Patient Has Paino No Site Locations Pain Management and Medication Current Pain Management: Electronic Signature(s) Signed: 07/02/2023 2:02:59  PM By: [name] Entered By: [name] on 06/26/2023 14:49:18 -------------------------------------------------------------------------------- Patient/Caregiver Education Details Patient Name: Date of Service: [name], [name] 8/21/2024andnbsp2:30 PM Medical Record Number:  [id number] Patient Account Number: [id number] Date of Birth/Gender: Treating RN: [birth year] (77 y.o. [name] Primary Care Physician: [name] Other Clinician: Referring Physician: Treating Physician/Extender: [name] in Treatment: 53 Education Assessment Education Provided To: Patient Education Topics Provided Wound/Skin Impairment: Methods: Explain/Verbal Responses: State content correctly Electronic Signature(s) Signed: 07/02/2023 2:02:59 PM By: [name] Entered By: [name] on 06/26/2023 14:59:01 [name] ([id number]) [id number] Page 6 of 8 -------------------------------------------------------------------------------- Wound Assessment Details Patient Name: Date of Service: [name], [name] 06/26/2023 2:30 PM Medical Record Number: [id number] Patient Account Number: [id number] Date of Birth/Sex: Treating RN: 07-20-1946 (77 y.o. [name] Primary Care Leeam Cedrone: [name] Other Clinician: Referring Raeann Offner: Treating Cari Burgo/Extender: Katharina Caper Weeks in Treatment: 47 Wound Status Wound Number: 2 Primary Venous Leg Ulcer Etiology: Wound Location: Left, Posterior Lower Leg Wound Open Wounding Event: Blister Status: Date Acquired: 07/06/2022 Comorbid Cataracts, Asthma, Hypertension, Neuropathy, Received Weeks Of Treatment: 47 History: Chemotherapy, Received Radiation Clustered Wound: No Photos Wound Measurements Length: (cm) 2 Width: (cm) 1.1 Depth: (cm) 0.2 Area: (cm) 1.728 Volume: (cm) 0.346 % Reduction in Area: 74.9% % Reduction in Volume: 49.6% Epithelialization: Small (1-33%) Tunneling: No Undermining: No Wound Description Classification: Full Thickness Without Exposed Suppor Wound Margin: Distinct, outline attached Exudate Amount: Medium Exudate Type: Serosanguineous Exudate Color: red, brown t Structures Foul Odor After  Cleansing: No Slough/Fibrino Yes Wound Bed Granulation Amount: Small (1-33%) Exposed Structure Granulation Quality: Red Fascia Exposed: No Necrotic Amount: Large (67-100%) Fat Layer (Subcutaneous Tissue) Exposed: Yes Necrotic Quality: Eschar, Adherent Slough Tendon Exposed: No Muscle Exposed: No Joint Exposed: No Bone Exposed: No Periwound Skin Texture Texture Color No Abnormalities Noted: Yes No Abnormalities Noted: No Rubor: Yes Moisture No Abnormalities Noted: Yes Temperature / Pain Temperature: No Abnormality Tenderness on Palpation: Yes Treatment Notes Wound #2 (Lower Leg) Wound Laterality: Left, Posterior Cleanser [name], [name] ([id number]) [id number] Page 7 of 8 Soap and Water Discharge Instruction: May shower and wash wound with dial antibacterial soap and water prior to dressing change. Peri-Wound Care Ketoconazole Cream 2% Discharge Instruction: Apply Ketoconazole as directed Triamcinolone 15 (g) Discharge Instruction: Use triamcinolone 15 (g) as directed Zinc Oxide Ointment 30g tube Discharge Instruction: Apply Zinc Oxide to periwound with each dressing change Sween Lotion (Moisturizing lotion) Discharge Instruction: Apply moisturizing lotion as directed Topical Gentamicin Discharge Instruction: As directed by physician Mupirocin Ointment Discharge Instruction: Apply Mupirocin (Bactroban) as instructed Skintegrity Hydrogel 4 (oz) Discharge Instruction: Apply hydrogel as directed Primary Dressing Endoform 2x2 in Discharge Instruction: Moisten with saline Secondary Dressing Optifoam Non-Adhesive Dressing, 4x4 in Discharge Instruction: Apply over primary dressing as directed. Zetuvit Plus 4x8 in Discharge Instruction: Apply over primary dressing as directed. Secured With [employer] T 2x10 (in/yd) ape Discharge Instruction: Secure with tape as directed. Compression Wrap CoFlex Calamine Unna Boot 4 x 6  (in/yd) Discharge Instruction: Apply Coflex [employer] as directed. Compression Stockings Add-Ons Electronic Signature(s) Signed: 07/02/2023 2:02:59 PM By: [name] Entered By: [name] on 06/26/2023 15:10:14 -------------------------------------------------------------------------------- Vitals Details Patient Name: Date of Service: [name], [name] 06/26/2023 2:30 PM Medical Record Number: [id number] Patient Account Number: [id number] Date of Birth/Sex: Treating RN: April 05, 1946 (77 y.o. [name] Primary Care Lujean Ebright: [name] Other Clinician: Referring Talvin Christianson: Treating Rivkah Wolz/Extender: Katharina Caper Weeks in  Treatment: 47 Vital Signs Time Taken: 14:46 Temperature (F): 98.3 Height (in): 63 Pulse (bpm): 77 Weight (lbs): 200 Respiratory Rate (breaths/min): 18 Body Mass Index (BMI): 35.4 Blood Pressure (mmHg): 138/77 Reference Range: 80 - 120 mg / dl CHIYOKO, FLEISHER ([id number]) [id number] Page 8 of 8 Electronic Signature(s) Signed: 07/02/2023 2:02:59 PM By: [name] Entered By: [name] on 06/26/2023 14:49:07

## 2023-07-02 NOTE — Progress Notes (Signed)
ANAIH, BOSKOVICH ([id number]) [id number] Page 1 of 12 Visit Report for 06/26/2023 Chief Complaint Document Details Patient Name: Date of Service: [name], [name] 06/26/2023 2:30 PM Medical Record Number: [id number] Patient Account Number: [id number] Date of Birth/Sex: Treating RN: 27-Oct-1946 (77 y.o. F) Primary Care Provider: Eustaquio Boyden Other Clinician: Referring Provider: Treating Provider/Extender: Priscille Loveless in Treatment: 98 Information Obtained from: Patient Chief Complaint Patient presents for treatment of an open ulcer due to venous insufficiency Electronic Signature(s) Signed: 06/26/2023 3:17:48 PM By: Duanne Guess MD FACS Entered By: Duanne Guess on 06/26/2023 15:17:48 -------------------------------------------------------------------------------- Debridement Details Patient Name: Date of Service: [name], [name] 06/26/2023 2:30 PM Medical Record Number: [id number] Patient Account Number: [id number] Date of Birth/Sex: Treating RN: April 25, 1946 (76 y.o. Gevena [name] Primary Care Provider: Eustaquio Boyden Other Clinician: Referring Provider: Treating Provider/Extender: Priscille Loveless in Treatment: 47 Debridement Performed for Assessment: Wound #2 Left,Posterior Lower Leg Performed By: Physician Duanne Guess, MD Debridement Type: Debridement Severity of Tissue Pre Debridement: Fat layer exposed Level of Consciousness (Pre-procedure): Awake and Alert Pre-procedure Verification/Time Out Yes - 15:07 Taken: Start Time: 15:10 Pain Control: Lidocaine 4% T opical Solution Percent of Wound Bed Debrided: 100% T Area Debrided (cm): otal 1.73 Tissue and other material debrided: Non-Viable, Slough, Slough Level: Non-Viable Tissue Debridement Description: Selective/Open Wound Instrument: Curette Bleeding: Minimum Hemostasis Achieved: Pressure Response to Treatment: Procedure  was tolerated well Level of Consciousness (Post- Awake and Alert procedure): Post Debridement Measurements of Total Wound Length: (cm) 2 Width: (cm) 1.1 Depth: (cm) 0.2 Volume: (cm) 0.346 Character of Wound/Ulcer Post Debridement: Improved Severity of Tissue Post Debridement: Fat layer exposed Post Procedure Diagnosis [name], [name] ([id number]) [id number] Page 2 of 12 Same as Pre-procedure Notes Scribed for Dr Lady Gary by Brenton Grills RN Electronic Signature(s) Signed: 06/26/2023 3:29:30 PM By: Duanne Guess MD FACS Signed: 07/02/2023 2:02:59 PM By: Brenton Grills Entered By: Brenton Grills on 06/26/2023 15:11:38 -------------------------------------------------------------------------------- HPI Details Patient Name: Date of Service: Amber, [name] 06/26/2023 2:30 PM Medical Record Number: [id number] Patient Account Number: [id number] Date of Birth/Sex: Treating RN: [birth year] (77 y.o. F) Primary Care Provider: Eustaquio Boyden Other Clinician: Referring Provider: Treating Provider/Extender: Priscille Loveless in Treatment: 38 History of Present Illness HPI Description: ADMISSION This is a 77 year old woman with a history of chronic venous insufficiency status post saphenous vein ablations in 2010 and 2016. 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 She presents today with an open venous ulcer on her left lower leg. It has been present for a little over 2 weeks. She saw her primary care provider who apparently swabbed the wound and grew out Pseudomonas. She just completed a course of ciprofloxacin for this. ABI was 0.91. She reports that she has had previous issues with ulcers in this same location, stemming back to a punch biopsy taken by a dermatologist many years ago. She has had several skin substitutes applied to the area that have ultimately resulted in healing on prior occasions. She has 2 small  ulcers on her left medial lower leg. There is slough accumulation in both of them. The more anterior of the 2 is quite small and has some soft slough on the surface, underneath which there is good granulation tissue. The more medial wound also has slough accumulation, but the underlying surface is fairly fibrotic and gritty. This is consistent with her provided history of multiple ulcers in the same location. 08/03/2022: The anterior wound is smaller today  with just a little bit of slough accumulation. The more medial wound continues to be very sensitive and fibrotic with slough buildup. 08/13/2022: The anterior wound is closed. The more medial wound remains sensitive with a fairly fibrotic surface. There is more granulation tissue filling in, however, and there is less slough than on prior occasions. 08/20/2022: The anterior wound remains closed. The more medial wound has filled with granulation tissue but still has a fair amount of slough on the surface and remains fairly tender. Unfortunately, she has developed 2 small ulcers just proximal to this. The fat layer is exposed in each. She says that over the weekend, she felt a burning sensation in that location. 08/27/2022: The 2 small wounds proximal to the main wound have merged into a single site. The wounds look a little bit dry, but they are quite clean without significant slough accumulation. They remain quite tender. 09/03/2022: All of the wounds have now merged into 1 large wound. There is a strong odor coming from the wound and the surface is not particularly viable. It is extremely painful for her today. 09/10/2022: The wound is less black and purple this week but still does not look particularly viable. The surface is desiccated. The culture that I took last week returned with a polymicrobial population including Pseudomonas. I prescribed both Augmentin and levofloxacin. She has not yet picked up levofloxacin, as her pharmacy only notified  her yesterday that it was ready. We have ordered a [employer] topical compounded antibiotic, but it has not yet come. 09/17/2022: The wound surface is markedly improved today. There is still an area of grayish-looking muscle but the rest appears significantly more viable. There is still a layer of slough on the surface. She still has a couple days left of oral antibiotic therapy. She has her Jodie Echevaria compounded topical antibiotic with her today. 09/24/2022: She has completed her oral antibiotic therapy. The wound surface is much cleaner today and more viable without any necrotic tissue. It is a bit desiccated, however. 10/01/2022: The moisture balance of the wound has improved. There is a layer of yellow slough on the surface, but beneath this, the wound is more pink. 10/08/2022: The wound is smaller and cleaner today with a layer of slough present. She is having less pain. 12/18; this patient has a new wound on the right lateral ankle which apparently started after a cat scratched her. Secondarily she managed to drop a cake pan on the same area. This is very painful. The original wound was on the left posterior calf we have been using [employer] under an [employer]. The Unna boot fell down and apparently she was in for a nurse visit perhaps last week and that 1 fell down as well This patient has central venous mediated hypertension. For member correctly she has a stent placed in the left common iliac artery by Dr. Randie Heinz some years ago [facility] Thurner syndrome] she also has had venous ablations and I believe a history of a DVT The patient has compression pumps at home but she does not use them 10/30/2022: The patient says that her wounds burned all week with the [facility] classic in place. She has a fair amount of slough and nonviable SHAMONA, BRUNELLI ([id number]) [id number] Page 3 of 12 subcutaneous tissue present on the right ankle. The left posterior calf wound is  cleaner than I have seen it to date. Both remain fairly tender. 11/14/2022: Both wounds have substantial slough and eschar accumulation. They remain extremely  tender. 11/21/2022: The wounds are cleaner this week, but still have slough and eschar accumulation. She continues to describe burning pain in both wounds, but upon further questioning, she actually has burning in both of her feet that radiates up her legs and includes to the wounds. 11/28/2022: Both wounds have slough and eschar accumulation, as well as adherent silver alginate that was difficult to remove. She continues to have pain out of proportion to the extent of her wounds. Her PCP did initiate Lyrica which she started taking last night. The dose is quite low, only 25 mg, but apparently there is a plan for upward titration, assuming she tolerates the medication. 12/05/2022: The wounds are both a little bit smaller today, but have significant slough accumulation, as usual. They remain exquisitely tender. She is now taking Lyrica twice a day. 12/19/2022: Both of her wounds look significantly better this week. There is slough buildup on both, but they are smaller. She seems to be getting a good response from Lyrica as she is having much less pain. 12/26/2022: The wound on her right lateral ankle is nearly closed. The wound on her left posterior calf is smaller but still has a lot of slough accumulation. They remain tender. 01/02/2023: The right lateral ankle wound is down to just a couple of millimeters. It is much less tender than on prior occasions. There is minimal slough buildup. The wound on her posterior calf continues to contract, as well. It has thick slough buildup and remains fairly tender. 01/09/2023: The right lateral ankle wound is nearly closed. He remains tender. There is just a little eschar on the surface. The wound on her posterior calf has improved quite a bit. There is still slough on the surface, but the more proximal area has  epithelialized substantially and there is no longer any gray discoloration to her tissue. 3/13; patient presents for follow-up. Her right lateral ankle wound is almost healed. She has a wound to her left posterior calf with slough and granulation tissue. We have been using Santyl under Unna boots bilaterally to the lower extremities. She has no issues or complaints today. 01/30/2023: The right lateral ankle wound is down to just a pinhole under a layer of eschar. The left posterior calf wound is quite a bit improved since the last time I saw it. There is still a layer of slough on the surface but there has been more epithelialization. 02/07/2023: The right lateral ankle wound still has some eschar on the surface. The left posterior calf wound is about the same size, but with more areas of epithelialization. 02/20/2023: The right lateral ankle wound is healed. The left posterior calf wound is smaller and is essentially flush with the surrounding skin, but the surface is quite dry. Her leg wrap slipped and her calf is quite swollen above the level of the wound. 03/01/2023: The right lateral ankle wound reopened and she also suffered a fall that resulted in a small wound on her right anterior tibial surface. The left posterior calf wound has a much better moisture balance today. There is a layer of slough on the surface. The [employer] worked much better for her as far as compression this week. 03/06/2023: The right lateral ankle wound has a layer of eschar on the surface and is too painful for me to debride. The anterior tibial surface wound is more or less healed; it is a very superficial abrasion at this point. The left posterior calf wound has a layer of slough on  the surface. Her edema control is better. 03/13/2023: The right lateral ankle wound has a layer of eschar on the surface. Once this was debrided, it was found to be healed. The anterior tibial surface wound has a layer of stuck silver alginate on  the surface. This was removed to reveal a very superficial abrasion but not much change from last week. The left posterior calf wound is smaller and has its usual layer of accumulated slough. 03/25/2023: The anterior tibial surface wound has eschar and silver alginate stuck to it. Once this was removed, the wound was found to be healed. The left posterior calf wound is roughly the same size and has a little bit thicker layer of accumulated slough. 04/03/2023: She has had more drainage from the posterior calf wound over the last week and the layer of slough is thicker. She also says it is more tender. 04/10/2023: Continued increase in drainage from her wound. The wound remains tender with a layer of slough on the surface. It does not appear as viable this week. She also has erythema, swelling, and warmth in her entire left lower leg extending into the midfoot. 04/17/2023: The wound is about the same size this week. There is fibrotic adherent slough, but the drainage is not as profuse. She is currently taking levofloxacin. She is complaining of an increase in the burning sensation at her wound. 04/24/2023: The wound measured smaller this week and is much cleaner-looking. She completed her course of levofloxacin. Her PCP increased her dose of Lyrica and she reports that the burning has stopped. 05/01/2023: The wound is smaller again today and quite clean with only minimal slough on the surface. 05/08/2023: The wound is smaller again today. It is fairly clean, but the surface is a bit fibrotic. Moisture balance is better. Her leg is more swollen and erythematous today, however. 05/15/2023: The wound is about the same size today. The surface has very little slough accumulation, but remains fibrotic. Moisture balance is good. She is responding nicely to the oral Keflex she is taking and her leg is less erythematous and edematous. 05/22/2023: The wound is unchanged in size. There is quite a bit of slough on the  surface but the moisture balance is better. It is still fibrotic, but a little bit less so. Her leg is no longer erythematous, but she does have some persistent edema. 05/29/2023: The wound measured a little bit smaller today. It is dry again and the surface is quite fibrotic. The edema in her leg has improved. 06/12/2023: The wound measures slightly larger today. The moisture balance has improved markedly. There is soft slough on the wound surface. Edema control is improved. 06/19/2023: The wound measured slightly smaller today. There is a band of epithelium dividing the most proximal portion of the wound from the remainder. There is slough accumulation and moisture balance is improved. 06/26/2023: The band of epithelium between the 2 open areas has gotten wider. There is some slough accumulation on the surfaces along with a little bit of eschar. Moisture balance continues to be good. Electronic Signature(s) Signed: 06/26/2023 3:18:37 PM By: Duanne Guess MD FACS Entered By: Duanne Guess on 06/26/2023 15:18:37 [name] ([id number]) [id number] Page 4 of 12 -------------------------------------------------------------------------------- Physical Exam Details Patient Name: Date of Service: [name], [name] 06/26/2023 2:30 PM Medical Record Number: [id number] Patient Account Number: [id number] Date of Birth/Sex: Treating RN: June 29, 1946 (77 y.o. F) Primary Care Provider: Eustaquio Boyden Other Clinician: Referring Provider: Treating Provider/Extender: Priscille Loveless in  Treatment: 47 Constitutional . . . . no acute distress. Respiratory Normal work of breathing on room air. Notes 06/26/2023: The band of epithelium between the 2 open areas has gotten wider. There is some slough accumulation on the surfaces along with a little bit of eschar. Moisture balance continues to be good. Electronic Signature(s) Signed: 06/26/2023 3:19:05 PM  By: Duanne Guess MD FACS Entered By: Duanne Guess on 06/26/2023 15:19:04 -------------------------------------------------------------------------------- Physician Orders Details Patient Name: Date of Service: [name], [name] 06/26/2023 2:30 PM Medical Record Number: [id number] Patient Account Number: [id number] Date of Birth/Sex: Treating RN: Dec 24, 1945 (78 y.o. Gevena [name] Primary Care Provider: Eustaquio Boyden Other Clinician: Referring Provider: Treating Provider/Extender: Priscille Loveless in Treatment: 78 Verbal / Phone Orders: No Diagnosis Coding ICD-10 Coding Code Description [phone] Non-pressure chronic ulcer of other part of left lower leg with fat layer exposed I83.893 Varicose veins of bilateral lower extremities with other complications I87.2 Venous insufficiency (chronic) (peripheral) G62.9 Polyneuropathy, unspecified R60.0 Localized edema Follow-up Appointments ppointment in 1 week. - Dr. Lady Gary Room 3 Return A Anesthetic Wound #2 Left,Posterior Lower Leg (In clinic) Topical Lidocaine 4% applied to wound bed Bathing/ Shower/ Hygiene May shower with protection but do not get wound dressing(s) wet. Protect dressing(s) with water repellant cover (for example, large plastic bag) or a cast cover and may then take shower. Edema Control - Lymphedema / SCD / Other Left Lower Extremity Lymphedema Pumps. Use Lymphedema pumps on leg(s) 2-3 times a day for 45-60 minutes. If wearing any wraps or hose, do not remove SCHELBY, LAFLASH ([id number]) [id number] Page 5 of 12 them. Continue exercising as instructed. - Use x1 per day Avoid standing for long periods of time. Exercise regularly Off-Loading Wound #2 Left,Posterior Lower Leg Other: - Elevate legs at heart level or above heart level while sitting. Wound Treatment Wound #2 - Lower Leg Wound Laterality: Left, Posterior Cleanser: Soap and Water 1 x Per  Week/30 Days Discharge Instructions: May shower and wash wound with dial antibacterial soap and water prior to dressing change. Peri-Wound Care: Ketoconazole Cream 2% 1 x Per Week/30 Days Discharge Instructions: Apply Ketoconazole as directed Peri-Wound Care: Triamcinolone 15 (g) 1 x Per Week/30 Days Discharge Instructions: Use triamcinolone 15 (g) as directed Peri-Wound Care: Zinc Oxide Ointment 30g tube 1 x Per Week/30 Days Discharge Instructions: Apply Zinc Oxide to periwound with each dressing change Peri-Wound Care: Sween Lotion (Moisturizing lotion) 1 x Per Week/30 Days Discharge Instructions: Apply moisturizing lotion as directed Topical: Gentamicin 1 x Per Week/30 Days Discharge Instructions: As directed by physician Topical: Mupirocin Ointment 1 x Per Week/30 Days Discharge Instructions: Apply Mupirocin (Bactroban) as instructed Topical: Skintegrity Hydrogel 4 (oz) 1 x Per Week/30 Days Discharge Instructions: Apply hydrogel as directed Prim Dressing: Endoform 2x2 in 1 x Per Week/30 Days ary Discharge Instructions: Moisten with saline Secondary Dressing: Optifoam Non-Adhesive Dressing, 4x4 in 1 x Per Week/30 Days Discharge Instructions: Apply over primary dressing as directed. Secondary Dressing: Zetuvit Plus 4x8 in 1 x Per Week/30 Days Discharge Instructions: Apply over primary dressing as directed. Secured With: 27M Medipore Scientist, research (life sciences) Surgical T 2x10 (in/yd) 1 x Per Week/30 Days ape Discharge Instructions: Secure with tape as directed. Compression Wrap: CoFlex Calamine Unna Boot 4 x 6 (in/yd) 1 x Per Week/30 Days Discharge Instructions: Apply Coflex Calamine [employer] as directed. Electronic Signature(s) Signed: 06/26/2023 3:29:30 PM By: Duanne Guess MD FACS Entered By: Duanne Guess on 06/26/2023 15:22:22 -------------------------------------------------------------------------------- Problem List Details Patient Name: Date of  Service: [name], [name] 06/26/2023  2:30 PM Medical Record Number: [id number] Patient Account Number: [id number] Date of Birth/Sex: Treating RN: [birth year] (77 y.o. Gevena [name] Primary Care Provider: Eustaquio Boyden Other Clinician: Referring Provider: Treating Provider/Extender: Priscille Loveless in Treatment: [address] Active Problems ICD-10 Encounter ARIJAH, FOYLE ([id number]) [id number] Page 6 of 12 Encounter Code Description Active Date MDM Diagnosis [phone] Non-pressure chronic ulcer of other part of left lower leg with fat layer exposed 07/27/2022 No Yes I83.893 Varicose veins of bilateral lower extremities with other complications 07/27/2022 No Yes I87.2 Venous insufficiency (chronic) (peripheral) 07/27/2022 No Yes G62.9 Polyneuropathy, unspecified 07/27/2022 No Yes R60.0 Localized edema 07/27/2022 No Yes Inactive Problems ICD-10 Code Description Active Date Inactive Date L03.115 Cellulitis of right lower limb 10/22/2022 10/22/2022 L97.818 Non-pressure chronic ulcer of other part of right lower leg with other specified severity 03/01/2023 10/22/2022 Resolved Problems Electronic Signature(s) Signed: 06/26/2023 3:17:32 PM By: Duanne Guess MD FACS Entered By: Duanne Guess on 06/26/2023 15:17:32 -------------------------------------------------------------------------------- Progress Note Details Patient Name: Date of Service: [name] 06/26/2023 2:30 PM Medical Record Number: [id number] Patient Account Number: [id number] Date of Birth/Sex: Treating RN: Oct 01, 1946 (77 y.o. F) Primary Care Provider: Eustaquio Boyden Other Clinician: Referring Provider: Treating Provider/Extender: Priscille Loveless in Treatment: 70 Subjective Chief Complaint Information obtained from Patient Patient presents for treatment of an open ulcer due to venous insufficiency History of Present Illness (HPI) ADMISSION This is a 77 year old woman with  a history of chronic venous insufficiency status post saphenous vein ablations in 2010 and 2016. 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 She presents today with an open venous ulcer on her left lower leg. It has been present for a little over 2 weeks. She saw her primary care provider who apparently swabbed the wound and grew out Pseudomonas. She just completed a course of ciprofloxacin for this. ABI was 0.91. She reports that she has had previous issues with ulcers in this same location, stemming back to a punch biopsy taken by a dermatologist many years ago. She has had several skin substitutes applied to the area that have ultimately resulted in healing on prior occasions. She has 2 small ulcers on her left medial lower leg. There is slough accumulation in both of them. The more anterior of the 2 is quite small and has some soft slough on the surface, underneath which there is good granulation tissue. The more medial wound also has slough accumulation, but the underlying surface is fairly fibrotic and gritty. This is consistent with her provided history of multiple ulcers in the same location. 08/03/2022: The anterior wound is smaller today with just a little bit of slough accumulation. The more medial wound continues to be very sensitive and fibrotic with slough buildup. SHER, COLAW ([id number]) [id number] Page 7 of 12 08/13/2022: The anterior wound is closed. The more medial wound remains sensitive with a fairly fibrotic surface. There is more granulation tissue filling in, however, and there is less slough than on prior occasions. 08/20/2022: The anterior wound remains closed. The more medial wound has filled with granulation tissue but still has a fair amount of slough on the surface and remains fairly tender. Unfortunately, she has developed 2 small ulcers just proximal to this. The fat layer is exposed in each. She says that over  the weekend, she felt a burning sensation in that location. 08/27/2022: The 2 small wounds proximal to the main wound have merged into a single  site. The wounds look a little bit dry, but they are quite clean without significant slough accumulation. They remain quite tender. 09/03/2022: All of the wounds have now merged into 1 large wound. There is a strong odor coming from the wound and the surface is not particularly viable. It is extremely painful for her today. 09/10/2022: The wound is less black and purple this week but still does not look particularly viable. The surface is desiccated. The culture that I took last week returned with a polymicrobial population including Pseudomonas. I prescribed both Augmentin and levofloxacin. She has not yet picked up levofloxacin, as her pharmacy only notified her yesterday that it was ready. We have ordered a [employer] topical compounded antibiotic, but it has not yet come. 09/17/2022: The wound surface is markedly improved today. There is still an area of grayish-looking muscle but the rest appears significantly more viable. There is still a layer of slough on the surface. She still has a couple days left of oral antibiotic therapy. She has her Jodie Echevaria compounded topical antibiotic with her today. 09/24/2022: She has completed her oral antibiotic therapy. The wound surface is much cleaner today and more viable without any necrotic tissue. It is a bit desiccated, however. 10/01/2022: The moisture balance of the wound has improved. There is a layer of yellow slough on the surface, but beneath this, the wound is more pink. 10/08/2022: The wound is smaller and cleaner today with a layer of slough present. She is having less pain. 12/18; this patient has a new wound on the right lateral ankle which apparently started after a cat scratched her. Secondarily she managed to drop a cake pan on the same area. This is very painful. The original wound was on the left  posterior calf we have been using [employer] under an [employer]. The Unna boot fell down and apparently she was in for a nurse visit perhaps last week and that 1 fell down as well This patient has central venous mediated hypertension. For member correctly she has a stent placed in the left common iliac artery by Dr. Randie Heinz some years ago [name]urner syndrome] she also has had venous ablations and I believe a history of a DVT The patient has compression pumps at home but she does not use them 10/30/2022: The patient says that her wounds burned all week with the [facility] classic in place. She has a fair amount of slough and nonviable subcutaneous tissue present on the right ankle. The left posterior calf wound is cleaner than I have seen it to date. Both remain fairly tender. 11/14/2022: Both wounds have substantial slough and eschar accumulation. They remain extremely tender. 11/21/2022: The wounds are cleaner this week, but still have slough and eschar accumulation. She continues to describe burning pain in both wounds, but upon further questioning, she actually has burning in both of her feet that radiates up her legs and includes to the wounds. 11/28/2022: Both wounds have slough and eschar accumulation, as well as adherent silver alginate that was difficult to remove. She continues to have pain out of proportion to the extent of her wounds. Her PCP did initiate Lyrica which she started taking last night. The dose is quite low, only 25 mg, but apparently there is a plan for upward titration, assuming she tolerates the medication. 12/05/2022: The wounds are both a little bit smaller today, but have significant slough accumulation, as usual. They remain exquisitely tender. She is now taking Lyrica twice a day. 12/19/2022:  Both of her wounds look significantly better this week. There is slough buildup on both, but they are smaller. She seems to be getting a good response from Lyrica as she is  having much less pain. 12/26/2022: The wound on her right lateral ankle is nearly closed. The wound on her left posterior calf is smaller but still has a lot of slough accumulation. They remain tender. 01/02/2023: The right lateral ankle wound is down to just a couple of millimeters. It is much less tender than on prior occasions. There is minimal slough buildup. The wound on her posterior calf continues to contract, as well. It has thick slough buildup and remains fairly tender. 01/09/2023: The right lateral ankle wound is nearly closed. He remains tender. There is just a little eschar on the surface. The wound on her posterior calf has improved quite a bit. There is still slough on the surface, but the more proximal area has epithelialized substantially and there is no longer any gray discoloration to her tissue. 3/13; patient presents for follow-up. Her right lateral ankle wound is almost healed. She has a wound to her left posterior calf with slough and granulation tissue. We have been using Santyl under Unna boots bilaterally to the lower extremities. She has no issues or complaints today. 01/30/2023: The right lateral ankle wound is down to just a pinhole under a layer of eschar. The left posterior calf wound is quite a bit improved since the last time I saw it. There is still a layer of slough on the surface but there has been more epithelialization. 02/07/2023: The right lateral ankle wound still has some eschar on the surface. The left posterior calf wound is about the same size, but with more areas of epithelialization. 02/20/2023: The right lateral ankle wound is healed. The left posterior calf wound is smaller and is essentially flush with the surrounding skin, but the surface is quite dry. Her leg wrap slipped and her calf is quite swollen above the level of the wound. 03/01/2023: The right lateral ankle wound reopened and she also suffered a fall that resulted in a small wound on her right  anterior tibial surface. The left posterior calf wound has a much better moisture balance today. There is a layer of slough on the surface. The [employer] worked much better for her as far as compression this week. 03/06/2023: The right lateral ankle wound has a layer of eschar on the surface and is too painful for me to debride. The anterior tibial surface wound is more or less healed; it is a very superficial abrasion at this point. The left posterior calf wound has a layer of slough on the surface. Her edema control is better. 03/13/2023: The right lateral ankle wound has a layer of eschar on the surface. Once this was debrided, it was found to be healed. The anterior tibial surface wound has a layer of stuck silver alginate on the surface. This was removed to reveal a very superficial abrasion but not much change from last week. The left posterior calf wound is smaller and has its usual layer of accumulated slough. 03/25/2023: The anterior tibial surface wound has eschar and silver alginate stuck to it. Once this was removed, the wound was found to be healed. The left MAELYS, HULEN ([id number]) [id number] Page 8 of 12 posterior calf wound is roughly the same size and has a little bit thicker layer of accumulated slough. 04/03/2023: She has had more drainage from the posterior calf wound  over the last week and the layer of slough is thicker. She also says it is more tender. 04/10/2023: Continued increase in drainage from her wound. The wound remains tender with a layer of slough on the surface. It does not appear as viable this week. She also has erythema, swelling, and warmth in her entire left lower leg extending into the midfoot. 04/17/2023: The wound is about the same size this week. There is fibrotic adherent slough, but the drainage is not as profuse. She is currently taking levofloxacin. She is complaining of an increase in the burning sensation at her wound. 04/24/2023:  The wound measured smaller this week and is much cleaner-looking. She completed her course of levofloxacin. Her PCP increased her dose of Lyrica and she reports that the burning has stopped. 05/01/2023: The wound is smaller again today and quite clean with only minimal slough on the surface. 05/08/2023: The wound is smaller again today. It is fairly clean, but the surface is a bit fibrotic. Moisture balance is better. Her leg is more swollen and erythematous today, however. 05/15/2023: The wound is about the same size today. The surface has very little slough accumulation, but remains fibrotic. Moisture balance is good. She is responding nicely to the oral Keflex she is taking and her leg is less erythematous and edematous. 05/22/2023: The wound is unchanged in size. There is quite a bit of slough on the surface but the moisture balance is better. It is still fibrotic, but a little bit less so. Her leg is no longer erythematous, but she does have some persistent edema. 05/29/2023: The wound measured a little bit smaller today. It is dry again and the surface is quite fibrotic. The edema in her leg has improved. 06/12/2023: The wound measures slightly larger today. The moisture balance has improved markedly. There is soft slough on the wound surface. Edema control is improved. 06/19/2023: The wound measured slightly smaller today. There is a band of epithelium dividing the most proximal portion of the wound from the remainder. There is slough accumulation and moisture balance is improved. 06/26/2023: The band of epithelium between the 2 open areas has gotten wider. There is some slough accumulation on the surfaces along with a little bit of eschar. Moisture balance continues to be good. Patient History Information obtained from Patient. Family History Cancer - Mother,Paternal Grandparents, Diabetes - Siblings, Heart Disease - Father, Hypertension - Father. Social History Former smoker - quit in  1967. Medical History Eyes Patient has history of Cataracts - L eye Ear/Nose/Mouth/Throat Denies history of Chronic sinus problems/congestion, Middle ear problems Hematologic/Lymphatic Denies history of Anemia, Human Immunodeficiency Virus, Lymphedema Respiratory Patient has history of Asthma Cardiovascular Patient has history of Hypertension Endocrine Denies history of Type I Diabetes, Type II Diabetes Neurologic Patient has history of Neuropathy - Feet and finger Denies history of Seizure Disorder Oncologic Patient has history of Received Chemotherapy - 2012, Received Radiation - 2012 Hospitalization/Surgery History - Lower extremity venography 2020. - Intravascular ultrasound. - peripheral vascular intervention. - melanoma 2011. 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 Vitals Time Taken: 2:46 PM, Height: 63 in, Weight: 200 lbs, BMI: 35.4, Temperature: 98.3 F, Pulse: 77 bpm, Respiratory Rate: 18 breaths/min, Blood Pressure: 138/77 mmHg. Respiratory Normal work of breathing on room air. General Notes:  06/26/2023: The band of epithelium between the 2 open areas has gotten wider. There is some slough accumulation on the surfaces along with a little bit of eschar. Moisture balance continues to be good. Integumentary (Hair, Skin) Wound #2 status is Open. Original cause of wound was Blister. The date acquired was: 07/06/2022. The wound has been in treatment 47 weeks. The wound is located on the Left,Posterior Lower Leg. The wound measures 2cm length x 1.1cm width x 0.2cm depth; 1.728cm^2 area and 0.346cm^3 volume. There is Fat Layer (Subcutaneous  Tissue) exposed. There is no tunneling or undermining noted. There is a medium amount of serosanguineous drainage noted. The wound margin is distinct with the outline attached to the wound base. There is small (1-33%) red granulation within the wound bed. There is a large (67-100%) amount of necrotic tissue within the wound bed including Eschar and Adherent Slough. The periwound skin appearance had no abnormalities noted for texture. The periwound skin appearance had no abnormalities noted for moisture. The periwound skin appearance exhibited: Rubor. Periwound temperature was noted as No Abnormality. The periwound has tenderness on palpation. Assessment Active Problems ICD-10 Non-pressure chronic ulcer of other part of left lower leg with fat layer exposed Varicose veins of bilateral lower extremities with other complications Venous insufficiency (chronic) (peripheral) Polyneuropathy, unspecified Localized edema Procedures Wound #2 Pre-procedure diagnosis of Wound #2 is a Venous Leg Ulcer located on the Left,Posterior Lower Leg .Severity of Tissue Pre Debridement is: Fat layer exposed. There was a Selective/Open Wound Non-Viable Tissue Debridement with a total area of 1.73 sq cm performed by Duanne Guess, MD. With the following instrument(s): Curette to remove Non-Viable tissue/material. Material removed includes [facility] after achieving pain control using Lidocaine 4% Topical Solution. No specimens were taken. A time out was conducted at 15:07, prior to the start of the procedure. A Minimum amount of bleeding was controlled with Pressure. The procedure was tolerated well. Post Debridement Measurements: 2cm length x 1.1cm width x 0.2cm depth; 0.346cm^3 volume. Character of Wound/Ulcer Post Debridement is improved. Severity of Tissue Post Debridement is: Fat layer exposed. Post procedure Diagnosis Wound #2: Same as Pre-Procedure General Notes: Scribed for Dr Lady Gary by Brenton Grills  RN. Plan Follow-up Appointments: Return Appointment in 1 week. - Dr. Lady Gary Room 3 Anesthetic: Wound #2 Left,Posterior Lower Leg: (In clinic) Topical Lidocaine 4% applied to wound bed Bathing/ Shower/ Hygiene: May shower with protection but do not get wound dressing(s) wet. Protect dressing(s) with water repellant cover (for example, large plastic bag) or a cast cover and may then take shower. Edema Control - Lymphedema / SCD / Other: Lymphedema Pumps. Use Lymphedema pumps on leg(s) 2-3 times a day for 45-60 minutes. If wearing any wraps or hose, do not remove them. Continue exercising as instructed. - Use x1 per day Avoid standing for long periods of time. Exercise regularly Off-Loading: Wound #2 Left,Posterior Lower Leg: Other: - Elevate legs at heart level or above heart level while sitting. WOUND #2: - Lower Leg Wound Laterality: Left, Posterior Cleanser: Soap and Water 1 x Per Week/30 Days Discharge Instructions: May shower and wash wound with dial antibacterial soap and water prior to dressing change. Peri-Wound Care: Ketoconazole Cream 2% 1 x Per Week/30 Days Discharge Instructions: Apply Ketoconazole as directed Peri-Wound Care: Triamcinolone 15 (g) 1 x Per Week/30 Days Discharge Instructions: Use triamcinolone 15 (g) as directed Peri-Wound Care: Zinc Oxide Ointment 30g tube 1 x Per Week/30 Days Discharge Instructions: Apply Zinc Oxide to periwound with each dressing change Peri-Wound  Care: Sween Lotion (Moisturizing lotion) 1 x Per Week/30 Days Discharge Instructions: Apply moisturizing lotion as directed Topical: Gentamicin 1 x Per Week/30 Days Discharge Instructions: As directed by physician LISLE, DINWIDDIE ([id number]) [id number] Page 10 of 12 Topical: Mupirocin Ointment 1 x Per Week/30 Days Discharge Instructions: Apply Mupirocin (Bactroban) as instructed Topical: Skintegrity Hydrogel 4 (oz) 1 x Per Week/30 Days Discharge Instructions: Apply  hydrogel as directed Prim Dressing: Endoform 2x2 in 1 x Per Week/30 Days ary Discharge Instructions: Moisten with saline Secondary Dressing: Optifoam Non-Adhesive Dressing, 4x4 in 1 x Per Week/30 Days Discharge Instructions: Apply over primary dressing as directed. Secondary Dressing: Zetuvit Plus 4x8 in 1 x Per Week/30 Days Discharge Instructions: Apply over primary dressing as directed. Secured With: 65M Medipore Scientist, research (life sciences) Surgical T 2x10 (in/yd) 1 x Per Week/30 Days ape Discharge Instructions: Secure with tape as directed. Com pression Wrap: CoFlex Calamine Unna Boot 4 x 6 (in/yd) 1 x Per Week/30 Days Discharge Instructions: Apply Coflex Calamine [employer] as directed. 06/26/2023: The band of epithelium between the 2 open areas has gotten wider. There is some slough accumulation on the surfaces along with a little bit of eschar. Moisture balance continues to be good. I used a curette to debride the slough and eschar from her wound. We will continue the mixture of topical gentamicin and mupirocin with endoform underneath Optifoam to maintain wound moisture. Continue calamine-based [employer]. Follow-up in 1 week. Electronic Signature(s) Signed: 06/26/2023 3:24:15 PM By: Duanne Guess MD FACS Entered By: Duanne Guess on 06/26/2023 15:24:15 -------------------------------------------------------------------------------- HxROS Details Patient Name: Date of Service: LARETHA, CORNACCHIA 06/26/2023 2:30 PM Medical Record Number: [id number] Patient Account Number: [id number] Date of Birth/Sex: Treating RN: 1946-10-30 (77 y.o. F) Primary Care Provider: Eustaquio Boyden Other Clinician: Referring Provider: Treating Provider/Extender: Priscille Loveless in Treatment: 53 Information Obtained From Patient Eyes Medical History: Positive for: Cataracts - L eye Ear/Nose/Mouth/Throat Medical History: Negative for: Chronic sinus problems/congestion; Middle ear  problems Hematologic/Lymphatic Medical History: Negative for: Anemia; Human Immunodeficiency Virus; Lymphedema Respiratory Medical History: Positive for: Asthma Past Medical History Notes: OSA on CPAP Cardiovascular Medical History: Positive for: Hypertension Past Medical History Notes: Cronic venous insufficiency 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 CHARMELL, [name] ([id number]) [id number] Page 11 of 12 Gastrointestinal Medical History: Past Medical History Notes: liver cyst Endocrine Medical History: Negative for: Type I Diabetes; Type II Diabetes Musculoskeletal Medical History: Past Medical History Notes: arthritis Neurologic Medical History: Positive for: Neuropathy - Feet and finger Negative for: Seizure Disorder Past Medical History Notes: restless leg syndrome Oncologic Medical History: Positive for: Received Chemotherapy - 2012; Received Radiation - 2012 HBO Extended History Items Eyes: Cataracts Immunizations Pneumococcal Vaccine: Received Pneumococcal Vaccination: Yes Received Pneumococcal Vaccination On or After 60th Birthday: Yes Implantable Devices No devices added Hospitalization / Surgery History Type of Hospitalization/Surgery Lower extremity venography 2020 Intravascular ultrasound peripheral vascular intervention melanoma 2011 Family and Social History Cancer: Yes - Mother,Paternal Grandparents; Diabetes: Yes - Siblings; Heart Disease: Yes - Father; Hypertension: Yes - Father; Former smoker - quit in [employer]) Signed: 06/26/2023 3:29:30 PM By: Duanne Guess MD FACS Entered By: Duanne Guess on 06/26/2023 15:18:44 -------------------------------------------------------------------------------- SuperBill Details Patient Name: Date of Service: AKELIA, [name] 06/26/2023 Medical Record Number: [id number] Patient Account Number: [id number] Date of Birth/Sex: Treating  RN: 06-15-1946 (77 y.o. Gevena [name] Primary Care Provider: Eustaquio Boyden Other Clinician: Referring Provider: Treating Provider/Extender: Priscille Loveless in Treatment: [address] [place] (  [id number]) [id number] Page 12 of 12 Diagnosis Coding ICD-10 Codes Code Description [phone] Non-pressure chronic ulcer of other part of left lower leg with fat layer exposed I83.893 Varicose veins of bilateral lower extremities with other complications I87.2 Venous insufficiency (chronic) (peripheral) G62.9 Polyneuropathy, unspecified R60.0 Localized edema Facility Procedures : CPT4 Code: [id number] Description: 97597 - DEBRIDE WOUND 1ST 20 SQ CM OR < ICD-10 Diagnosis Description L97.822 Non-pressure chronic ulcer of other part of left lower leg with fat layer expose Modifier: d Quantity: 1 Physician Procedures : CPT4 Code Description Modifier [id number] - WC PHYS LEVEL 4 - EST PT 25 ICD-10 Diagnosis Description L97.822 Non-pressure chronic ulcer of other part of left lower leg with fat layer exposed I83.893 Varicose veins of bilateral lower extremities with  other complications I87.2 Venous insufficiency (chronic) (peripheral) R60.0 Localized edema Quantity: 1 : [id number] 97597 - WC PHYS DEBR WO ANESTH 20 SQ CM ICD-10 Diagnosis Description L97.822 Non-pressure chronic ulcer of other part of left lower leg with fat layer exposed Quantity: 1 Electronic Signature(s) Signed: 06/26/2023 3:24:32 PM By: Duanne Guess MD FACS Entered By: Duanne Guess on 06/26/2023 15:24:32

## 2023-07-03 ENCOUNTER — Encounter (HOSPITAL_BASED_OUTPATIENT_CLINIC_OR_DEPARTMENT_OTHER): Payer: Medicare Other | Admitting: General Surgery

## 2023-07-03 DIAGNOSIS — L97222 Non-pressure chronic ulcer of left calf with fat layer exposed: Secondary | ICD-10-CM | POA: Diagnosis not present

## 2023-07-03 DIAGNOSIS — G629 Polyneuropathy, unspecified: Secondary | ICD-10-CM | POA: Diagnosis not present

## 2023-07-03 DIAGNOSIS — I83893 Varicose veins of bilateral lower extremities with other complications: Secondary | ICD-10-CM | POA: Diagnosis not present

## 2023-07-03 DIAGNOSIS — I872 Venous insufficiency (chronic) (peripheral): Secondary | ICD-10-CM | POA: Diagnosis not present

## 2023-07-03 DIAGNOSIS — L97822 Non-pressure chronic ulcer of other part of left lower leg with fat layer exposed: Secondary | ICD-10-CM | POA: Diagnosis not present

## 2023-07-03 DIAGNOSIS — R600 Localized edema: Secondary | ICD-10-CM | POA: Diagnosis not present

## 2023-07-03 DIAGNOSIS — Z87891 Personal history of nicotine dependence: Secondary | ICD-10-CM | POA: Diagnosis not present

## 2023-07-03 NOTE — Progress Notes (Signed)
[name], [name] ([id number]) [id number] Page 1 of 12 Visit Report for 07/03/2023 Chief Complaint Document Details Patient Name: Date of Service: [name], [name] 07/03/2023 2:00 PM Medical Record Number: [id number] Patient Account Number: [id number] Date of Birth/Sex: Treating RN: 06/20/1946 (77 y.o. F) Primary Care Provider: Eustaquio [name] Other Clinician: Referring Provider: Treating Provider/Extender: [name] in Treatment: 48 Information Obtained from: Patient Chief Complaint Patient presents for treatment of an open ulcer due to venous insufficiency Electronic Signature(s) Signed: 07/03/2023 2:43:04 PM By: Amber Guess MD FACS Entered By: [name] on 07/03/2023 11:43:04 -------------------------------------------------------------------------------- Debridement Details Patient Name: Date of Service: [name] 07/03/2023 2:00 PM Medical Record Number: [id number] Patient Account Number: [id number] Date of Birth/Sex: Treating RN: 07-03-1946 (77 y.o. [name] Primary Care Provider: Eustaquio [name] Other Clinician: Referring Provider: Treating Provider/Extender: [name] in Treatment: 48 Debridement Performed for Assessment: Wound #2 Left,Posterior Lower Leg Performed By: Physician Amber Guess, MD Debridement Type: Debridement Severity of Tissue Pre Debridement: Fat layer exposed Level of Consciousness (Pre-procedure): Awake and Alert Pre-procedure Verification/Time Out Yes - 14:13 Taken: Start Time: 14:13 Pain Control: Lidocaine 4% Topical Solution Percent of Wound Bed Debrided: 100% T Area Debrided (cm): otal 6.59 Tissue and other material debrided: Non-Viable, Eschar, Slough, Slough Level: Non-Viable Tissue Debridement Description: Selective/Open Wound Instrument: Curette Bleeding: Minimum Hemostasis Achieved: Pressure Response to Treatment:  Procedure was tolerated well Level of Consciousness (Post- Awake and Alert procedure): Post Debridement Measurements of Total Wound Length: (cm) 4 Width: (cm) 2.1 Depth: (cm) 0.1 Volume: (cm) 0.66 Character of Wound/Ulcer Post Debridement: Improved Severity of Tissue Post Debridement: Fat layer exposed Post Procedure Diagnosis [name], [name] ([id number]) [id number] Page 2 of 12 Same as Pre-procedure Notes Scribed for Dr. Lady [name] by Amber Bruin, RN Electronic Signature(s) Signed: 07/03/2023 3:26:24 PM By: [name] Signed: 07/03/2023 3:38:16 PM By: Amber Guess MD FACS Entered By: [name] on 07/03/2023 11:14:30 -------------------------------------------------------------------------------- HPI Details Patient Name: Date of Service: [name], [name] 07/03/2023 2:00 PM Medical Record Number: [id number] Patient Account Number: [id number] Date of Birth/Sex: Treating RN: 1946-09-21 (77 y.o. F) Primary Care Provider: Eustaquio [name] Other Clinician: Referring Provider: Treating Provider/Extender: [name] in Treatment: 48 History of Present Illness HPI Description: ADMISSION This is a 77 year old woman with a history of chronic venous insufficiency status post saphenous vein ablations in 2010 and 2016. 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 She presents today with an open venous ulcer on her left lower leg. It has been present for a little over 2 weeks. She saw her primary care provider who apparently swabbed the wound and grew out Pseudomonas. She just completed a course of ciprofloxacin for this. ABI was 0.91. She reports that she has had previous issues with ulcers in this same location, stemming back to a punch biopsy taken by a dermatologist many years ago. She has had several skin substitutes applied to the area that have ultimately resulted in healing on prior  occasions. She has 2 small ulcers on her left medial lower leg. There is slough accumulation in both of them. The more anterior of the 2 is quite small and has some soft slough on the surface, underneath which there is good granulation tissue. The more medial wound also has slough accumulation, but the underlying surface is fairly fibrotic and gritty. This is consistent with her provided history of multiple ulcers in the same location. 08/03/2022: The anterior wound is smaller today  with just a little bit of slough accumulation. The more medial wound continues to be very sensitive and fibrotic with slough buildup. 08/13/2022: The anterior wound is closed. The more medial wound remains sensitive with a fairly fibrotic surface. There is more granulation tissue filling in, however, and there is less slough than on prior occasions. 08/20/2022: The anterior wound remains closed. The more medial wound has filled with granulation tissue but still has a fair amount of slough on the surface and remains fairly tender. Unfortunately, she has developed 2 small ulcers just proximal to this. The fat layer is exposed in each. She says that over the weekend, she felt a burning sensation in that location. 08/27/2022: The 2 small wounds proximal to the main wound have merged into a single site. The wounds look a little bit dry, but they are quite clean without significant slough accumulation. They remain quite tender. 09/03/2022: All of the wounds have now merged into 1 large wound. There is a strong odor coming from the wound and the surface is not particularly viable. It is extremely painful for her today. 09/10/2022: The wound is less black and purple this week but still does not look particularly viable. The surface is desiccated. The culture that I took last week returned with a polymicrobial population including Pseudomonas. I prescribed both Augmentin and levofloxacin. She has not yet picked up levofloxacin, as  her pharmacy only notified her yesterday that it was ready. We have ordered a [employer] topical compounded antibiotic, but it has not yet come. 09/17/2022: The wound surface is markedly improved today. There is still an area of grayish-looking muscle but the rest appears significantly more viable. There is still a layer of slough on the surface. She still has a couple days left of oral antibiotic therapy. She has her Jodie Echevaria compounded topical antibiotic with her today. 09/24/2022: She has completed her oral antibiotic therapy. The wound surface is much cleaner today and more viable without any necrotic tissue. It is a bit desiccated, however. 10/01/2022: The moisture balance of the wound has improved. There is a layer of yellow slough on the surface, but beneath this, the wound is more pink. 10/08/2022: The wound is smaller and cleaner today with a layer of slough present. She is having less pain. 12/18; this patient has a new wound on the right lateral ankle which apparently started after a cat scratched her. Secondarily she managed to drop a cake pan on the same area. This is very painful. The original wound was on the left posterior calf we have been using [employer] under an [employer]. The Unna boot fell down and apparently she was in for a nurse visit perhaps last week and that 1 fell down as well This patient has central venous mediated hypertension. For member correctly she has a stent placed in the left common iliac artery by Dr. Randie Heinz some years ago [facility] Thurner syndrome] she also has had venous ablations and I believe a history of a DVT The patient has compression pumps at home but she does not use them 10/30/2022: The patient says that her wounds burned all week with the [facility] classic in place. She has a fair amount of slough and nonviable ANVIKA, NORRICK ([id number]) [id number] Page 3 of 12 subcutaneous tissue present on the right ankle. The left  posterior calf wound is cleaner than I have seen it to date. Both remain fairly tender. 11/14/2022: Both wounds have substantial slough and eschar accumulation. They remain extremely  tender. 11/21/2022: The wounds are cleaner this week, but still have slough and eschar accumulation. She continues to describe burning pain in both wounds, but upon further questioning, she actually has burning in both of her feet that radiates up her legs and includes to the wounds. 11/28/2022: Both wounds have slough and eschar accumulation, as well as adherent silver alginate that was difficult to remove. She continues to have pain out of proportion to the extent of her wounds. Her PCP did initiate Lyrica which she started taking last night. The dose is quite low, only 25 mg, but apparently there is a plan for upward titration, assuming she tolerates the medication. 12/05/2022: The wounds are both a little bit smaller today, but have significant slough accumulation, as usual. They remain exquisitely tender. She is now taking Lyrica twice a day. 12/19/2022: Both of her wounds look significantly better this week. There is slough buildup on both, but they are smaller. She seems to be getting a good response from Lyrica as she is having much less pain. 12/26/2022: The wound on her right lateral ankle is nearly closed. The wound on her left posterior calf is smaller but still has a lot of slough accumulation. They remain tender. 01/02/2023: The right lateral ankle wound is down to just a couple of millimeters. It is much less tender than on prior occasions. There is minimal slough buildup. The wound on her posterior calf continues to contract, as well. It has thick slough buildup and remains fairly tender. 01/09/2023: The right lateral ankle wound is nearly closed. He remains tender. There is just a little eschar on the surface. The wound on her posterior calf has improved quite a bit. There is still slough on the surface, but the  more proximal area has epithelialized substantially and there is no longer any gray discoloration to her tissue. 3/13; patient presents for follow-up. Her right lateral ankle wound is almost healed. She has a wound to her left posterior calf with slough and granulation tissue. We have been using Santyl under Unna boots bilaterally to the lower extremities. She has no issues or complaints today. 01/30/2023: The right lateral ankle wound is down to just a pinhole under a layer of eschar. The left posterior calf wound is quite a bit improved since the last time I saw it. There is still a layer of slough on the surface but there has been more epithelialization. 02/07/2023: The right lateral ankle wound still has some eschar on the surface. The left posterior calf wound is about the same size, but with more areas of epithelialization. 02/20/2023: The right lateral ankle wound is healed. The left posterior calf wound is smaller and is essentially flush with the surrounding skin, but the surface is quite dry. Her leg wrap slipped and her calf is quite swollen above the level of the wound. 03/01/2023: The right lateral ankle wound reopened and she also suffered a fall that resulted in a small wound on her right anterior tibial surface. The left posterior calf wound has a much better moisture balance today. There is a layer of slough on the surface. The [employer] worked much better for her as far as compression this week. 03/06/2023: The right lateral ankle wound has a layer of eschar on the surface and is too painful for me to debride. The anterior tibial surface wound is more or less healed; it is a very superficial abrasion at this point. The left posterior calf wound has a layer of slough on  the surface. Her edema control is better. 03/13/2023: The right lateral ankle wound has a layer of eschar on the surface. Once this was debrided, it was found to be healed. The anterior tibial surface wound has a layer of  stuck silver alginate on the surface. This was removed to reveal a very superficial abrasion but not much change from last week. The left posterior calf wound is smaller and has its usual layer of accumulated slough. 03/25/2023: The anterior tibial surface wound has eschar and silver alginate stuck to it. Once this was removed, the wound was found to be healed. The left posterior calf wound is roughly the same size and has a little bit thicker layer of accumulated slough. 04/03/2023: She has had more drainage from the posterior calf wound over the last week and the layer of slough is thicker. She also says it is more tender. 04/10/2023: Continued increase in drainage from her wound. The wound remains tender with a layer of slough on the surface. It does not appear as viable this week. She also has erythema, swelling, and warmth in her entire left lower leg extending into the midfoot. 04/17/2023: The wound is about the same size this week. There is fibrotic adherent slough, but the drainage is not as profuse. She is currently taking levofloxacin. She is complaining of an increase in the burning sensation at her wound. 04/24/2023: The wound measured smaller this week and is much cleaner-looking. She completed her course of levofloxacin. Her PCP increased her dose of Lyrica and she reports that the burning has stopped. 05/01/2023: The wound is smaller again today and quite clean with only minimal slough on the surface. 05/08/2023: The wound is smaller again today. It is fairly clean, but the surface is a bit fibrotic. Moisture balance is better. Her leg is more swollen and erythematous today, however. 05/15/2023: The wound is about the same size today. The surface has very little slough accumulation, but remains fibrotic. Moisture balance is good. She is responding nicely to the oral Keflex she is taking and her leg is less erythematous and edematous. 05/22/2023: The wound is unchanged in size. There is quite a  bit of slough on the surface but the moisture balance is better. It is still fibrotic, but a little bit less so. Her leg is no longer erythematous, but she does have some persistent edema. 05/29/2023: The wound measured a little bit smaller today. It is dry again and the surface is quite fibrotic. The edema in her leg has improved. 06/12/2023: The wound measures slightly larger today. The moisture balance has improved markedly. There is soft slough on the wound surface. Edema control is improved. 06/19/2023: The wound measured slightly smaller today. There is a band of epithelium dividing the most proximal portion of the wound from the remainder. There is slough accumulation and moisture balance is improved. 06/26/2023: The band of epithelium between the 2 open areas has gotten wider. There is some slough accumulation on the surfaces along with a little bit of eschar. Moisture balance continues to be good. 07/03/2023: The band of epithelium continues to expand. The moisture balance remains good and the surface, particularly of the more distal aspect of the wound continues to improve. Slough and eschar accumulation, as usual. Psychologist, prison and probation services) Signed: 07/03/2023 2:46:22 PM By: Amber Guess MD FACS [facility], [name] ([id number]) [id number] Page 4 of 12 Signed: 07/03/2023 2:46:22 PM By: Amber Guess MD FACS Entered By: [name] on 07/03/2023 11:46:22 -------------------------------------------------------------------------------- Physical Exam Details Patient Name:  Date of Service: [name], [name] 07/03/2023 2:00 PM Medical Record Number: [id number] Patient Account Number: [id number] Date of Birth/Sex: Treating RN: 1945-11-28 (77 y.o. F) Primary Care Provider: Eustaquio [name] Other Clinician: Referring Provider: Treating Provider/Extender: [name] in Treatment: 48 Constitutional Hypertensive, asymptomatic. . . . no  acute distress. Respiratory Normal work of breathing on room air. Notes 07/03/2023: The band of epithelium continues to expand. The moisture balance remains good and the surface, particularly of the more distal aspect of the wound continues to improve. Slough and eschar accumulation, as usual. Psychologist, prison and probation services) Signed: 07/03/2023 2:47:14 PM By: Amber Guess MD FACS Entered By: [name] on 07/03/2023 11:47:14 -------------------------------------------------------------------------------- Physician Orders Details Patient Name: Date of Service: [name], [name] 07/03/2023 2:00 PM Medical Record Number: [id number] Patient Account Number: [id number] Date of Birth/Sex: Treating RN: 18-Nov-1945 (77 y.o. [name] Primary Care Provider: Eustaquio [name] Other Clinician: Referring Provider: Treating Provider/Extender: [name] in Treatment: 53 Verbal / Phone Orders: No Diagnosis Coding ICD-10 Coding Code Description [phone] Non-pressure chronic ulcer of other part of left lower leg with fat layer exposed I83.893 Varicose veins of bilateral lower extremities with other complications I87.2 Venous insufficiency (chronic) (peripheral) G62.9 Polyneuropathy, unspecified R60.0 Localized edema Follow-up Appointments ppointment in 1 week. - Dr. Lady [name] Room 3 Return A Anesthetic Wound #2 Left,Posterior Lower Leg (In clinic) Topical Lidocaine 4% applied to wound bed Bathing/ Shower/ Hygiene May shower with protection but do not get wound dressing(s) wet. Protect dressing(s) with water repellant cover (for example, large plastic bag) or a cast cover and may then take shower. [name], BLOODSAW ([id number]) [id number] Page 5 of 12 Edema Control - Lymphedema / SCD / Other Left Lower Extremity Lymphedema Pumps. Use Lymphedema pumps on leg(s) 2-3 times a day for 45-60 minutes. If wearing any wraps or hose, do not  remove them. Continue exercising as instructed. - Use x1 per day Avoid standing for long periods of time. Exercise regularly Off-Loading Wound #2 Left,Posterior Lower Leg Other: - Elevate legs at heart level or above heart level while sitting. Wound Treatment Wound #2 - Lower Leg Wound Laterality: Left, Posterior Cleanser: Soap and Water 1 x Per Week/30 Days Discharge Instructions: May shower and wash wound with dial antibacterial soap and water prior to dressing change. Peri-Wound Care: Ketoconazole Cream 2% 1 x Per Week/30 Days Discharge Instructions: Apply Ketoconazole as directed Peri-Wound Care: Triamcinolone 15 (g) 1 x Per Week/30 Days Discharge Instructions: Use triamcinolone 15 (g) as directed Peri-Wound Care: Zinc Oxide Ointment 30g tube 1 x Per Week/30 Days Discharge Instructions: Apply Zinc Oxide to periwound with each dressing change Peri-Wound Care: Sween Lotion (Moisturizing lotion) 1 x Per Week/30 Days Discharge Instructions: Apply moisturizing lotion as directed Topical: Gentamicin 1 x Per Week/30 Days Discharge Instructions: As directed by physician Topical: Mupirocin Ointment 1 x Per Week/30 Days Discharge Instructions: Apply Mupirocin (Bactroban) as instructed Topical: Skintegrity Hydrogel 4 (oz) 1 x Per Week/30 Days Discharge Instructions: Apply hydrogel as directed Prim Dressing: Endoform 2x2 in 1 x Per Week/30 Days ary Discharge Instructions: Moisten with saline Secondary Dressing: Optifoam Non-Adhesive Dressing, 4x4 in 1 x Per Week/30 Days Discharge Instructions: Apply over primary dressing as directed. Secondary Dressing: Zetuvit Plus 4x8 in 1 x Per Week/30 Days Discharge Instructions: Apply over primary dressing as directed. Secured With: 74M Medipore Scientist, research (life sciences) Surgical T 2x10 (in/yd) 1 x Per Week/30 Days ape Discharge Instructions: Secure with tape as directed. Compression Wrap: CoFlex [employer]  4 x 6 (in/yd) 1 x Per Week/30 Days Discharge  Instructions: Apply Coflex [employer] as directed. Patient Medications llergies: sulfa antibiotics, Voltaren, doxycycline, Neoprene, adhesive, latex A Notifications Medication Indication Start End 07/03/2023 lidocaine DOSE topical 4 % cream - cream topical Electronic Signature(s) Signed: 07/03/2023 3:38:16 PM By: Amber Guess MD FACS Entered By: [name] on 07/03/2023 11:47:44 Problem List Details -------------------------------------------------------------------------------- [name] ([id number]) [id number] Page 6 of 12 Patient Name: Date of Service: [name], [name] 07/03/2023 2:00 PM Medical Record Number: [id number] Patient Account Number: [id number] Date of Birth/Sex: Treating RN: 08/17/1946 (77 y.o. F) Primary Care Provider: Eustaquio [name] Other Clinician: Referring Provider: Treating Provider/Extender: [name] in Treatment: [phone] Active Problems ICD-10 Encounter Code Description Active Date MDM Diagnosis [phone] Non-pressure chronic ulcer of other part of left lower leg with fat layer exposed 07/27/2022 No Yes I83.893 Varicose veins of bilateral lower extremities with other complications 07/27/2022 No Yes I87.2 Venous insufficiency (chronic) (peripheral) 07/27/2022 No Yes G62.9 Polyneuropathy, unspecified 07/27/2022 No Yes R60.0 Localized edema 07/27/2022 No Yes Inactive Problems ICD-10 Code Description Active Date Inactive Date L03.115 Cellulitis of right lower limb 10/22/2022 10/22/2022 L97.818 Non-pressure chronic ulcer of other part of right lower leg with other specified severity 03/01/2023 10/22/2022 Resolved Problems Electronic Signature(s) Signed: 07/03/2023 2:30:26 PM By: Amber Guess MD FACS Entered By: [name] on 07/03/2023 11:30:25 -------------------------------------------------------------------------------- Progress Note Details Patient Name: Date of  Service: [name] 07/03/2023 2:00 PM Medical Record Number: [id number] Patient Account Number: [id number] Date of Birth/Sex: Treating RN: February 18, 1946 (77 y.o. F) Primary Care Provider: Eustaquio [name] Other Clinician: Referring Provider: Treating Provider/Extender: [name] in Treatment: 48 Subjective Chief Complaint Information obtained from Patient Patient presents for treatment of an open ulcer due to venous insufficiency [name], [name] ([id number]) [id number] Page 7 of 12 History of Present Illness (HPI) ADMISSION This is a 77 year old woman with a history of chronic venous insufficiency status post saphenous vein ablations in 2010 and 2016. 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 She presents today with an open venous ulcer on her left lower leg. It has been present for a little over 2 weeks. She saw her primary care provider who apparently swabbed the wound and grew out Pseudomonas. She just completed a course of ciprofloxacin for this. ABI was 0.91. She reports that she has had previous issues with ulcers in this same location, stemming back to a punch biopsy taken by a dermatologist many years ago. She has had several skin substitutes applied to the area that have ultimately resulted in healing on prior occasions. She has 2 small ulcers on her left medial lower leg. There is slough accumulation in both of them. The more anterior of the 2 is quite small and has some soft slough on the surface, underneath which there is good granulation tissue. The more medial wound also has slough accumulation, but the underlying surface is fairly fibrotic and gritty. This is consistent with her provided history of multiple ulcers in the same location. 08/03/2022: The anterior wound is smaller today with just a little bit of slough accumulation. The more medial wound continues to be very sensitive and  fibrotic with slough buildup. 08/13/2022: The anterior wound is closed. The more medial wound remains sensitive with a fairly fibrotic surface. There is more granulation tissue filling in, however, and there is less slough than on prior occasions. 08/20/2022: The anterior wound remains closed. The more medial wound  has filled with granulation tissue but still has a fair amount of slough on the surface and remains fairly tender. Unfortunately, she has developed 2 small ulcers just proximal to this. The fat layer is exposed in each. She says that over the weekend, she felt a burning sensation in that location. 08/27/2022: The 2 small wounds proximal to the main wound have merged into a single site. The wounds look a little bit dry, but they are quite clean without significant slough accumulation. They remain quite tender. 09/03/2022: All of the wounds have now merged into 1 large wound. There is a strong odor coming from the wound and the surface is not particularly viable. It is extremely painful for her today. 09/10/2022: The wound is less black and purple this week but still does not look particularly viable. The surface is desiccated. The culture that I took last week returned with a polymicrobial population including Pseudomonas. I prescribed both Augmentin and levofloxacin. She has not yet picked up levofloxacin, as her pharmacy only notified her yesterday that it was ready. We have ordered a [facility] topical compounded antibiotic, but it has not yet come. 09/17/2022: The wound surface is markedly improved today. There is still an area of grayish-looking muscle but the rest appears significantly more viable. There is still a layer of slough on the surface. She still has a couple days left of oral antibiotic therapy. She has her Jodie Echevaria compounded topical antibiotic with her today. 09/24/2022: She has completed her oral antibiotic therapy. The wound surface is much cleaner today and more viable  without any necrotic tissue. It is a bit desiccated, however. 10/01/2022: The moisture balance of the wound has improved. There is a layer of yellow slough on the surface, but beneath this, the wound is more pink. 10/08/2022: The wound is smaller and cleaner today with a layer of slough present. She is having less pain. 12/18; this patient has a new wound on the right lateral ankle which apparently started after a cat scratched her. Secondarily she managed to drop a cake pan on the same area. This is very painful. The original wound was on the left posterior calf we have been using [employer] under an [employer]. The Unna boot fell down and apparently she was in for a nurse visit perhaps last week and that 1 fell down as well This patient has central venous mediated hypertension. For member correctly she has a stent placed in the left common iliac artery by Dr. 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 She has a fair amount of slough and nonviable subcutaneous tissue present on the right ankle. The left posterior calf wound is cleaner than I have seen it to date. Both remain fairly tender. 11/14/2022: Both wounds have substantial slough and eschar accumulation. They remain extremely tender. 11/21/2022: The wounds are cleaner this week, but still have slough and eschar accumulation. She continues to describe burning pain in both wounds, but upon further questioning, she actually has burning in both of her feet that radiates up her legs and includes to the wounds. 11/28/2022: Both wounds have slough and eschar accumulation, as well as adherent silver alginate that was difficult to remove. She continues to have pain out of proportion to the  extent of her wounds. Her  PCP did initiate Lyrica which she started taking last night. The dose is quite low, only 25 mg, but apparently there is a plan for upward titration, assuming she tolerates the medication. 12/05/2022: The wounds are both a little bit smaller today, but have significant slough accumulation, as usual. They remain exquisitely tender. She is now taking Lyrica twice a day. 12/19/2022: Both of her wounds look significantly better this week. There is slough buildup on both, but they are smaller. She seems to be getting a good response from Lyrica as she is having much less pain. 12/26/2022: The wound on her right lateral ankle is nearly closed. The wound on her left posterior calf is smaller but still has a lot of slough accumulation. They remain tender. 01/02/2023: The right lateral ankle wound is down to just a couple of millimeters. It is much less tender than on prior occasions. There is minimal slough buildup. The wound on her posterior calf continues to contract, as well. It has thick slough buildup and remains fairly tender. 01/09/2023: The right lateral ankle wound is nearly closed. He remains tender. There is just a little eschar on the surface. The wound on her posterior calf has improved quite a bit. There is still slough on the surface, but the more proximal area has epithelialized substantially and there is no longer any gray discoloration to her tissue. 3/13; patient presents for follow-up. Her right lateral ankle wound is almost healed. She has a wound to her left posterior calf with slough and granulation tissue. We have been using Santyl under Unna boots bilaterally to the lower extremities. She has no issues or complaints today. 01/30/2023: The right lateral ankle wound is down to just a pinhole under a layer of eschar. The left posterior calf wound is quite a bit improved since the last time I saw it. There is still a layer of slough on the surface but there has been more  epithelialization. 02/07/2023: The right lateral ankle wound still has some eschar on the surface. The left posterior calf wound is about the same size, but with more areas of epithelialization. 02/20/2023: The right lateral ankle wound is healed. The left posterior calf wound is smaller and is essentially flush with the surrounding skin, but the surface is [name], [name] ([id number]) [id number] Page 8 of 12 quite dry. Her leg wrap slipped and her calf is quite swollen above the level of the wound. 03/01/2023: The right lateral ankle wound reopened and she also suffered a fall that resulted in a small wound on her right anterior tibial surface. The left posterior calf wound has a much better moisture balance today. There is a layer of slough on the surface. The [employer] worked much better for her as far as compression this week. 03/06/2023: The right lateral ankle wound has a layer of eschar on the surface and is too painful for me to debride. The anterior tibial surface wound is more or less healed; it is a very superficial abrasion at this point. The left posterior calf wound has a layer of slough on the surface. Her edema control is better. 03/13/2023: The right lateral ankle wound has a layer of eschar on the surface. Once this was debrided, it was found to be healed. The anterior tibial surface wound has a layer of stuck silver alginate on the surface. This was removed to reveal a very superficial abrasion but not much change from last week. The left posterior calf  wound is smaller and has its usual layer of accumulated slough. 03/25/2023: The anterior tibial surface wound has eschar and silver alginate stuck to it. Once this was removed, the wound was found to be healed. The left posterior calf wound is roughly the same size and has a little bit thicker layer of accumulated slough. 04/03/2023: She has had more drainage from the posterior calf wound over the last week and the  layer of slough is thicker. She also says it is more tender. 04/10/2023: Continued increase in drainage from her wound. The wound remains tender with a layer of slough on the surface. It does not appear as viable this week. She also has erythema, swelling, and warmth in her entire left lower leg extending into the midfoot. 04/17/2023: The wound is about the same size this week. There is fibrotic adherent slough, but the drainage is not as profuse. She is currently taking levofloxacin. She is complaining of an increase in the burning sensation at her wound. 04/24/2023: The wound measured smaller this week and is much cleaner-looking. She completed her course of levofloxacin. Her PCP increased her dose of Lyrica and she reports that the burning has stopped. 05/01/2023: The wound is smaller again today and quite clean with only minimal slough on the surface. 05/08/2023: The wound is smaller again today. It is fairly clean, but the surface is a bit fibrotic. Moisture balance is better. Her leg is more swollen and erythematous today, however. 05/15/2023: The wound is about the same size today. The surface has very little slough accumulation, but remains fibrotic. Moisture balance is good. She is responding nicely to the oral Keflex she is taking and her leg is less erythematous and edematous. 05/22/2023: The wound is unchanged in size. There is quite a bit of slough on the surface but the moisture balance is better. It is still fibrotic, but a little bit less so. Her leg is no longer erythematous, but she does have some persistent edema. 05/29/2023: The wound measured a little bit smaller today. It is dry again and the surface is quite fibrotic. The edema in her leg has improved. 06/12/2023: The wound measures slightly larger today. The moisture balance has improved markedly. There is soft slough on the wound surface. Edema control is improved. 06/19/2023: The wound measured slightly smaller today. There is a band of  epithelium dividing the most proximal portion of the wound from the remainder. There is slough accumulation and moisture balance is improved. 06/26/2023: The band of epithelium between the 2 open areas has gotten wider. There is some slough accumulation on the surfaces along with a little bit of eschar. Moisture balance continues to be good. 07/03/2023: The band of epithelium continues to expand. The moisture balance remains good and the surface, particularly of the more distal aspect of the wound continues to improve. Slough and eschar accumulation, as usual. Patient History Information obtained from Patient. Family History Cancer - Mother,Paternal Grandparents, Diabetes - Siblings, Heart Disease - Father, Hypertension - Father. Social History Former smoker - quit in 1967. Medical History Eyes Patient has history of Cataracts - L eye Ear/Nose/Mouth/Throat Denies history of Chronic sinus problems/congestion, Middle ear problems Hematologic/Lymphatic Denies history of Anemia, Human Immunodeficiency Virus, Lymphedema Respiratory Patient has history of Asthma Cardiovascular Patient has history of Hypertension Endocrine Denies history of Type I Diabetes, Type II Diabetes Neurologic Patient has history of Neuropathy - Feet and finger Denies history of Seizure Disorder Oncologic Patient has history of Received Chemotherapy - 2012, Received Radiation -  2012 Hospitalization/Surgery History - Lower extremity venography 2020. - Intravascular ultrasound. - peripheral vascular intervention. - melanoma 2011. 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  no acute distress. Vitals Time Taken: 2:00 PM, Height: 63 in, Weight: 200 lbs, BMI: 35.4, Temperature: 98.5 F, Pulse: 75 bpm, Respiratory Rate: 18 breaths/min, Blood Pressure: 169/78 mmHg. Respiratory Normal work of breathing on room air. General Notes: 07/03/2023: The band of epithelium continues to expand. The moisture balance remains good and the surface, particularly of the more distal aspect of the wound continues to improve. Slough and eschar accumulation, as usual. Integumentary (Hair, Skin) Wound #2 status is Open. Original cause of wound was Blister. The date acquired was: 07/06/2022. The wound has been in treatment 48 weeks. The wound is located on the Left,Posterior Lower Leg. The wound measures 4cm length x 2.1cm width x 0.1cm depth; 6.597cm^2 area and 0.66cm^3 volume. There is Fat Layer (Subcutaneous Tissue) exposed. There is no tunneling or undermining noted. There is a medium amount of serosanguineous drainage noted. The wound margin is distinct with the outline attached to the wound base. There is small (1-33%) red granulation within the wound bed. There is a large (67-100%) amount of necrotic tissue within the wound bed including Eschar and Adherent Slough. The periwound skin appearance had no abnormalities noted for texture. The periwound skin appearance had no abnormalities noted for moisture. The periwound skin appearance exhibited: Rubor. Periwound temperature was noted as No Abnormality. The periwound has tenderness on palpation. Assessment Active Problems ICD-10 Non-pressure chronic ulcer of other part of left lower leg with fat layer exposed Varicose veins of bilateral lower extremities with other complications Venous insufficiency (chronic) (peripheral) Polyneuropathy, unspecified Localized edema Procedures Wound #2 Pre-procedure diagnosis of Wound #2 is a Venous Leg Ulcer located on the Left,Posterior Lower Leg .Severity of Tissue Pre Debridement is: Fat  layer exposed. There was a Selective/Open Wound Non-Viable Tissue Debridement with a total area of 6.59 sq cm performed by Amber Guess, MD. With the following instrument(s): Curette to remove Non-Viable tissue/material. Material removed includes Eschar and Slough and after achieving pain control using Lidocaine 4% T opical Solution. No specimens were taken. A time out was conducted at 14:13, prior to the start of the procedure. A Minimum amount of bleeding was controlled with Pressure. The procedure was tolerated well. Post Debridement Measurements: 4cm length x 2.1cm width x 0.1cm depth; 0.66cm^3 volume. Character of Wound/Ulcer Post Debridement is improved. Severity of Tissue Post Debridement is: Fat layer exposed. Post procedure Diagnosis Wound #2: Same as Pre-Procedure General Notes: Scribed for Dr. Lady [name] by Amber Bruin, RN. Pre-procedure diagnosis of Wound #2 is a Venous Leg Ulcer located on the Left,Posterior Lower Leg . There was a Double Layer Compression Therapy Procedure by Amber Bruin, RN. Post procedure Diagnosis Wound #2: Same as Pre-Procedure Plan Follow-up Appointments: Return Appointment in 1 week. - Dr. Lady [name] Room 3 Anesthetic: Wound #2 Left,Posterior Lower Leg: (In clinic) Topical Lidocaine 4% applied to wound bed Bathing/ Shower/ Hygiene: [name], [name] ([id number]) [id number] Page 10 of 12 May shower with protection but do not get wound dressing(s) wet. Protect dressing(s) with water repellant cover (for example, large plastic bag) or a cast cover and may then take shower. Edema Control - Lymphedema / SCD / Other: Lymphedema Pumps.  Use Lymphedema pumps on leg(s) 2-3 times a day for 45-60 minutes. If wearing any wraps or hose, do not remove them. Continue exercising as instructed. - Use x1 per day Avoid standing for long periods of time. Exercise regularly Off-Loading: Wound #2 Left,Posterior Lower Leg: Other: - Elevate  legs at heart level or above heart level while sitting. The following medication(s) was prescribed: lidocaine topical 4 % cream cream topical was prescribed at facility WOUND #2: - Lower Leg Wound Laterality: Left, Posterior Cleanser: Soap and Water 1 x Per Week/30 Days Discharge Instructions: May shower and wash wound with dial antibacterial soap and water prior to dressing change. Peri-Wound Care: Ketoconazole Cream 2% 1 x Per Week/30 Days Discharge Instructions: Apply Ketoconazole as directed Peri-Wound Care: Triamcinolone 15 (g) 1 x Per Week/30 Days Discharge Instructions: Use triamcinolone 15 (g) as directed Peri-Wound Care: Zinc Oxide Ointment 30g tube 1 x Per Week/30 Days Discharge Instructions: Apply Zinc Oxide to periwound with each dressing change Peri-Wound Care: Sween Lotion (Moisturizing lotion) 1 x Per Week/30 Days Discharge Instructions: Apply moisturizing lotion as directed Topical: Gentamicin 1 x Per Week/30 Days Discharge Instructions: As directed by physician Topical: Mupirocin Ointment 1 x Per Week/30 Days Discharge Instructions: Apply Mupirocin (Bactroban) as instructed Topical: Skintegrity Hydrogel 4 (oz) 1 x Per Week/30 Days Discharge Instructions: Apply hydrogel as directed Prim Dressing: Endoform 2x2 in 1 x Per Week/30 Days ary Discharge Instructions: Moisten with saline Secondary Dressing: Optifoam Non-Adhesive Dressing, 4x4 in 1 x Per Week/30 Days Discharge Instructions: Apply over primary dressing as directed. Secondary Dressing: Zetuvit Plus 4x8 in 1 x Per Week/30 Days Discharge Instructions: Apply over primary dressing as directed. Secured With: 36M Medipore Scientist, research (life sciences) Surgical T 2x10 (in/yd) 1 x Per Week/30 Days ape Discharge Instructions: Secure with tape as directed. Com pression Wrap: CoFlex Calamine Unna Boot 4 x 6 (in/yd) 1 x Per Week/30 Days Discharge Instructions: Apply Coflex Calamine [employer] as directed. 07/03/2023: The band of epithelium  continues to expand. The moisture balance remains good and the surface, particularly of the more distal aspect of the wound continues to improve. Slough and eschar accumulation, as usual. I used a curette to debride slough and eschar from her wound. We will continue topical gentamicin and mupirocin with endoform. We are covering the wound with Optifoam to maintain the moisture balance. Continue calamine-based [employer]. Follow-up in 1 week. Electronic Signature(s) Signed: 07/03/2023 2:48:35 PM By: Amber Guess MD FACS Entered By: [name] on 07/03/2023 11:48:35 -------------------------------------------------------------------------------- HxROS Details Patient Name: Date of Service: SEMIA, KAPPEL 07/03/2023 2:00 PM Medical Record Number: [id number] Patient Account Number: [id number] Date of Birth/Sex: Treating RN: [birth year] (77 y.o. F) Primary Care Provider: Eustaquio [name] Other Clinician: Referring Provider: Treating Provider/Extender: [name] in Treatment: 48 Information Obtained From Patient Eyes Medical History: Positive for: Cataracts - L eye Ear/Nose/Mouth/Throat Medical History: Negative for: Chronic sinus problems/congestion; Middle ear problems RALENE, COLGROVE ([id number]) [id number] Page 11 of 12 Hematologic/Lymphatic Medical History: Negative for: Anemia; Human Immunodeficiency Virus; Lymphedema Respiratory Medical History: Positive for: Asthma Past Medical History Notes: OSA on CPAP Cardiovascular Medical History: Positive for: Hypertension Past Medical History Notes: Cronic venous insufficiency 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 Gastrointestinal Medical History: Past Medical History Notes: liver cyst Endocrine Medical History: Negative for: Type I Diabetes; Type II Diabetes Musculoskeletal Medical History: Past Medical History  Notes: arthritis Neurologic Medical History: Positive for: Neuropathy - Feet and finger Negative for: Seizure Disorder Past Medical History  Notes: restless leg syndrome Oncologic Medical History: Positive for: Received Chemotherapy - 2012; Received Radiation - 2012 HBO Extended History Items Eyes: Cataracts Immunizations Pneumococcal Vaccine: Received Pneumococcal Vaccination: Yes Received Pneumococcal Vaccination On or After 60th Birthday: Yes Implantable Devices No devices added Hospitalization / Surgery History Type of Hospitalization/Surgery Lower extremity venography 2020 Intravascular ultrasound peripheral vascular intervention melanoma 2011 Family and Social History Cancer: Yes - Mother,Paternal Grandparents; Diabetes: Yes - Siblings; Heart Disease: Yes - Father; Hypertension: Yes - Father; Former smoker - quit in [employer]) MATTISEN, WYNDER ([id number]) [id number] Page 12 of 12 Signed: 07/03/2023 3:38:16 PM By: Amber Guess MD FACS Entered By: [name] on 07/03/2023 11:46:36 -------------------------------------------------------------------------------- SuperBill Details Patient Name: Date of Service: JOLA, COFFIE 07/03/2023 Medical Record Number: [id number] Patient Account Number: [id number] Date of Birth/Sex: Treating RN: 12-Nov-1945 (77 y.o. F) Primary Care Provider: Eustaquio [name] Other Clinician: Referring Provider: Treating Provider/Extender: [name] in Treatment: 48 Diagnosis Coding ICD-10 Codes Code Description [phone] Non-pressure chronic ulcer of other part of left lower leg with fat layer exposed I83.893 Varicose veins of bilateral lower extremities with other complications I87.2 Venous insufficiency (chronic) (peripheral) G62.9 Polyneuropathy, unspecified R60.0 Localized edema Facility Procedures : CPT4 Code: [id number] Description: 97597 - DEBRIDE  WOUND 1ST 20 SQ CM OR < ICD-10 Diagnosis Description L97.822 Non-pressure chronic ulcer of other part of left lower leg with fat layer expose Modifier: d Quantity: 1 Physician Procedures : CPT4 Code Description Modifier [id number] - WC PHYS LEVEL 4 - EST PT 25 ICD-10 Diagnosis Description L97.822 Non-pressure chronic ulcer of other part of left lower leg with fat layer exposed I83.893 Varicose veins of bilateral lower extremities with  other complications I87.2 Venous insufficiency (chronic) (peripheral) G62.9 Polyneuropathy, unspecified Quantity: 1 : [id number] - WC PHYS DEBR WO ANESTH 20 SQ CM ICD-10 Diagnosis Description L97.822 Non-pressure chronic ulcer of other part of left lower leg with fat layer exposed Quantity: 1 Electronic Signature(s) Signed: 07/03/2023 2:49:04 PM By: Amber Guess MD FACS Entered By: [name] on 07/03/2023 11:49:03

## 2023-07-03 NOTE — Progress Notes (Signed)
RONIE, RACZKOWSKI ([id number]) [id number] Page 1 of 8 Visit Report for 07/03/2023 Arrival Information Details Patient Name: Date of Service: KAYDN, [name] 07/03/2023 2:00 PM Medical Record Number: [id number] Patient Account Number: [id number] Date of Birth/Sex: Treating RN: November 11, 1945 (77 y.o. [name] Primary Care Ranada Vigorito: Eustaquio Boyden Other Clinician: Referring Kengo Sturges: Treating Saivon Prowse/Extender: Priscille Loveless in Treatment: 48 Visit Information History Since Last Visit Added or deleted any medications: No Patient Arrived: Ambulatory Any new allergies or adverse reactions: No Arrival Time: 13:58 Had a fall or experienced change in No Accompanied By: self activities of daily living that may affect Transfer Assistance: None risk of falls: Patient Identification Verified: Yes Signs or symptoms of abuse/neglect since last visito No Secondary Verification Process Completed: Yes Hospitalized since last visit: No Patient Requires Transmission-Based Precautions: No Implantable device outside of the clinic excluding No Patient Has Alerts: No cellular tissue based products placed in the center since last visit: Has Dressing in Place as Prescribed: Yes Has Compression in Place as Prescribed: Yes Pain Present Now: No Electronic Signature(s) Signed: 07/03/2023 3:26:24 PM By: Samuella Bruin Entered By: Samuella Bruin on 07/03/2023 10:59:15 -------------------------------------------------------------------------------- Compression Therapy Details Patient Name: Date of Service: XOE, MECKLER 07/03/2023 2:00 PM Medical Record Number: [id number] Patient Account Number: [id number] Date of Birth/Sex: Treating RN: [birth year] (77 y.o. [name] Primary Care Takoya Jonas: Eustaquio Boyden Other Clinician: Referring Conall Vangorder: Treating Kurt Azimi/Extender: Priscille Loveless in Treatment:  48 Compression Therapy Performed for Wound Assessment: Wound #2 Left,Posterior Lower Leg Performed By: Clinician Samuella Bruin, RN Compression Type: Double Layer Post Procedure Diagnosis Same as Pre-procedure Electronic Signature(s) Signed: 07/03/2023 3:26:24 PM By: Samuella Bruin Entered By: Samuella Bruin on 07/03/2023 11:14:40 [name] ([id number]) [id number] Page 2 of 8 -------------------------------------------------------------------------------- Encounter Discharge Information Details Patient Name: Date of Service: ARJANAE, DELMEDICO 07/03/2023 2:00 PM Medical Record Number: [id number] Patient Account Number: [id number] Date of Birth/Sex: Treating RN: 01-Jun-1946 (77 y.o. [name] Primary Care Shakiyla Kook: Eustaquio Boyden Other Clinician: Referring Brittnye Josephs: Treating Elenore Wanninger/Extender: Priscille Loveless in Treatment: 48 Encounter Discharge Information Items Post Procedure Vitals Discharge Condition: Stable Temperature (F): 98.5 Ambulatory Status: Ambulatory Pulse (bpm): 75 Discharge Destination: Home Respiratory Rate (breaths/min): 18 Transportation: Private Auto Blood Pressure (mmHg): 169/78 Accompanied By: self Schedule Follow-up Appointment: Yes Clinical Summary of Care: Patient Declined Electronic Signature(s) Signed: 07/03/2023 3:26:24 PM By: Samuella Bruin Entered By: Samuella Bruin on 07/03/2023 11:44:56 -------------------------------------------------------------------------------- Lower Extremity Assessment Details Patient Name: Date of Service: BELEN, WESTMEYER 07/03/2023 2:00 PM Medical Record Number: [id number] Patient Account Number: [id number] Date of Birth/Sex: Treating RN: September 24, 1946 (77 y.o. [name] Primary Care Ronson Hagins: Eustaquio Boyden Other Clinician: Referring Jester Klingberg: Treating Bridget [place]/Extender: Katharina Caper Weeks in  Treatment: 48 Edema Assessment Assessed: [name]: No] [Right: No] Edema: [Left: Ye] [Right: s] Calf Left: Right: Point of Measurement: 31 cm From Medial Instep 44 cm Ankle Left: Right: Point of Measurement: 9 cm From Medial Instep 23.5 cm Vascular Assessment Pulses: Dorsalis Pedis Palpable: [Left:Yes] Extremity colors, hair growth, and conditions: Extremity Color: [Left:Normal] Hair Growth on Extremity: [Left:Yes] Temperature of Extremity: [Left:Warm] Capillary Refill: [Left:< 3 seconds] Dependent Rubor: [Left:No No] Electronic Signature(s) Signed: 07/03/2023 3:26:24 PM By: Samuella Bruin Entered By: Samuella Bruin on 07/03/2023 11:05:33 [name] ([id number]) [id number] Page 3 of 8 -------------------------------------------------------------------------------- Multi Wound Chart Details Patient Name: Date of Service: SHAQUNDA, BEDOYA 07/03/2023 2:00 PM Medical Record Number: [id number] Patient Account Number: [id number] Date of Birth/Sex:  Treating RN: [birth year] (77 y.o. F) Primary Care Damira Kem: Eustaquio Boyden Other Clinician: Referring Vercie Pokorny: Treating Margree Gimbel/Extender: Priscille Loveless in Treatment: 48 Vital Signs Height(in): 63 Pulse(bpm): 75 Weight(lbs): 200 Blood Pressure(mmHg): 169/78 Body Mass Index(BMI): 35.4 Temperature(F): 98.5 Respiratory Rate(breaths/min): 18 [2:Photos:] [N/A:N/A] Left, Posterior Lower Leg N/A N/A Wound Location: Blister N/A N/A Wounding Event: Venous Leg Ulcer N/A N/A Primary Etiology: Cataracts, Asthma, Hypertension, N/A N/A Comorbid History: Neuropathy, Received Chemotherapy, Received Radiation 07/06/2022 N/A N/A Date Acquired: 80 N/A N/A Weeks of Treatment: Open N/A N/A Wound Status: No N/A N/A Wound Recurrence: 4x2.1x0.1 N/A N/A Measurements L x W x D (cm) 6.597 N/A N/A A (cm) : rea 0.66 N/A N/A Volume (cm) : 4.00% N/A N/A % Reduction in A rea: 3.90%  N/A N/A % Reduction in Volume: Full Thickness Without Exposed N/A N/A Classification: Support Structures Medium N/A N/A Exudate A mount: Serosanguineous N/A N/A Exudate Type: red, brown N/A N/A Exudate Color: Distinct, outline attached N/A N/A Wound Margin: Small (1-33%) N/A N/A Granulation A mount: Red N/A N/A Granulation Quality: Large (67-100%) N/A N/A Necrotic A mount: Eschar, Adherent Slough N/A N/A Necrotic Tissue: Fat Layer (Subcutaneous Tissue): Yes N/A N/A Exposed Structures: Fascia: No Tendon: No Muscle: No Joint: No Bone: No Small (1-33%) N/A N/A Epithelialization: Debridement - Selective/Open Wound N/A N/A Debridement: Pre-procedure Verification/Time Out 14:13 N/A N/A Taken: Lidocaine 4% Topical Solution N/A N/A Pain Control: Necrotic/Eschar, Slough N/A N/A Tissue Debrided: Non-Viable Tissue N/A N/A Level: 6.59 N/A N/A Debridement A (sq cm): rea Curette N/A N/A Instrument: Minimum N/A N/A Bleeding: Pressure N/A N/A Hemostasis A chieved: Procedure was tolerated well N/A N/A Debridement Treatment Response: 4x2.1x0.1 N/A N/A Post Debridement Measurements L x W x D (cm) 0.66 N/A N/A Post Debridement Volume: (cm) Scarring: Yes N/A N/A Periwound Skin TextureBONNE, DIFEDE ([id number]) [id number] Page 4 of 8 Maceration: No N/A N/A Periwound Skin Moisture: Dry/Scaly: No Rubor: Yes N/A N/A Periwound Skin Color: No Abnormality N/A N/A Temperature: Yes N/A N/A Tenderness on Palpation: Compression Therapy N/A N/A Procedures Performed: Debridement Treatment Notes Electronic Signature(s) Signed: 07/03/2023 2:42:46 PM By: Duanne Guess MD FACS Entered By: Duanne Guess on 07/03/2023 11:42:46 -------------------------------------------------------------------------------- Multi-Disciplinary Care Plan Details Patient Name: Date of Service: TALER, BOUTELL 07/03/2023 2:00 PM Medical Record Number:  [id number] Patient Account Number: [id number] Date of Birth/Sex: Treating RN: 1946/06/05 (77 y.o. [name] Primary Care Jedidiah Demartini: Eustaquio Boyden Other Clinician: Referring Elainah Rhyne: Treating Percilla Tweten/Extender: Priscille Loveless in Treatment: 48 Multidisciplinary Care Plan reviewed with physician Active Inactive Venous Leg Ulcer Nursing Diagnoses: Knowledge deficit related to disease process and management Potential for venous Insuffiency (use before diagnosis confirmed) Goals: Patient will maintain optimal edema control Date Initiated: 02/07/2023 Target Resolution Date: 08/02/2023 Goal Status: Active Interventions: Assess peripheral edema status every visit. Compression as ordered Treatment Activities: Therapeutic compression applied : 02/07/2023 Notes: Wound/Skin Impairment Nursing Diagnoses: Impaired tissue integrity Goals: Ulcer/skin breakdown will have a volume reduction of 30% by week 4 Date Initiated: 08/03/2022 Date Inactivated: 09/10/2022 Target Resolution Date: 08/31/2022 Unmet Reason: uncontrolled edema, Goal Status: Unmet acute infection Ulcer/skin breakdown will have a volume reduction of 50% by week 8 Date Initiated: 09/10/2022 Target Resolution Date: 08/02/2023 Goal Status: Active Interventions: Assess ulceration(s) every visit Treatment Activities: Skin care regimen initiated : 08/03/2022 KEALY, SNOPEK ([id number]) [id number] Page 5 of 8 Notes: Jodie Echevaria ordered. 10/30 RX Levo started. 09/10/22 Electronic Signature(s) Signed: 07/03/2023 3:26:24 PM By: Samuella Bruin Entered By: Samuella Bruin on 07/03/2023  11:42:32 -------------------------------------------------------------------------------- Pain Assessment Details Patient Name: Date of Service: NIKOLLE, TANKS 07/03/2023 2:00 PM Medical Record Number: [id number] Patient Account Number: [id number] Date of Birth/Sex: Treating  RN: 10/29/1946 (77 y.o. [name] Primary Care Jenicka Coxe: Eustaquio Boyden Other Clinician: Referring Anjelina Dung: Treating Tami Blass/Extender: Priscille Loveless in Treatment: 48 Active Problems Location of Pain Severity and Description of Pain Patient Has Paino No Site Locations Rate the pain. Current Pain Level: 0 Pain Management and Medication Current Pain Management: Electronic Signature(s) Signed: 07/03/2023 3:26:24 PM By: Samuella Bruin Entered By: Samuella Bruin on 07/03/2023 11:00:11 -------------------------------------------------------------------------------- Patient/Caregiver Education Details Patient Name: Date of Service: [place], Vita J. 8/28/2024andnbsp2:00 PM Medical Record Number: [id number] Patient Account Number: [id number] Date of Birth/Gender: Treating RN: 1946-03-09 (77 y.o. [name] Primary Care Physician: Eustaquio Boyden Other Clinician: Referring Physician: Treating Physician/Extender: Priscille Loveless in Treatment: [address] TEKELA, PARRILLI ([id number]) [id number] Page 6 of 8 Education Provided To: Patient Education Topics Provided Wound/Skin Impairment: Methods: Explain/Verbal Responses: Reinforcements needed, State content correctly Electronic Signature(s) Signed: 07/03/2023 3:26:24 PM By: Samuella Bruin Entered By: Samuella Bruin on 07/03/2023 11:43:37 -------------------------------------------------------------------------------- Wound Assessment Details Patient Name: Date of Service: IEASHA, HNAT 07/03/2023 2:00 PM Medical Record Number: [id number] Patient Account Number: [id number] Date of Birth/Sex: Treating RN: [birth year] (77 y.o. [name] Primary Care Ranika Mcniel: Eustaquio Boyden Other Clinician: Referring Cornelis Kluver: Treating Brodyn Depuy/Extender: Katharina Caper Weeks in Treatment:  48 Wound Status Wound Number: 2 Primary Venous Leg Ulcer Etiology: Wound Location: Left, Posterior Lower Leg Wound Open Wounding Event: Blister Status: Date Acquired: 07/06/2022 Comorbid Cataracts, Asthma, Hypertension, Neuropathy, Received Weeks Of Treatment: 48 History: Chemotherapy, Received Radiation Clustered Wound: No Photos Wound Measurements Length: (cm) 4 Width: (cm) 2.1 Depth: (cm) 0.1 Area: (cm) 6.597 Volume: (cm) 0.66 % Reduction in Area: 4% % Reduction in Volume: 3.9% Epithelialization: Small (1-33%) Tunneling: No Undermining: No Wound Description Classification: Full Thickness Without Exposed Support Structures Wound Margin: Distinct, outline attached Exudate Amount: Medium Exudate Type: Serosanguineous Exudate Color: red, brown Foul Odor After Cleansing: No Slough/Fibrino Yes Wound Bed Granulation Amount: Small (1-33%) Exposed Structure Granulation Quality: Red Fascia Exposed: No Necrotic Amount: Large (67-100%) Fat Layer (Subcutaneous Tissue) Exposed: Yes Necrotic Quality: Eschar, Adherent Slough Tendon Exposed: No Muscle Exposed: No REBECKA, TINCHER ([id number]) [id number] Page 7 of 8 Joint Exposed: No Bone Exposed: No Periwound Skin Texture Texture Color No Abnormalities Noted: Yes No Abnormalities Noted: No Rubor: Yes Moisture No Abnormalities Noted: Yes Temperature / Pain Temperature: No Abnormality Tenderness on Palpation: Yes Treatment Notes Wound #2 (Lower Leg) Wound Laterality: Left, Posterior Cleanser Soap and Water Discharge Instruction: May shower and wash wound with dial antibacterial soap and water prior to dressing change. Peri-Wound Care Ketoconazole Cream 2% Discharge Instruction: Apply Ketoconazole as directed Triamcinolone 15 (g) Discharge Instruction: Use triamcinolone 15 (g) as directed Zinc Oxide Ointment 30g tube Discharge Instruction: Apply Zinc Oxide to periwound with each dressing  change Sween Lotion (Moisturizing lotion) Discharge Instruction: Apply moisturizing lotion as directed Topical Gentamicin Discharge Instruction: As directed by physician Mupirocin Ointment Discharge Instruction: Apply Mupirocin (Bactroban) as instructed Skintegrity Hydrogel 4 (oz) Discharge Instruction: Apply hydrogel as directed Primary Dressing Endoform 2x2 in Discharge Instruction: Moisten with saline Secondary Dressing Optifoam Non-Adhesive Dressing, 4x4 in Discharge Instruction: Apply over primary dressing as directed. Zetuvit Plus 4x8 in Discharge Instruction: Apply over primary dressing as directed. Secured With [employer] T 2x10 (in/yd) ape Discharge Instruction: Secure with tape  as directed. Compression Wrap CoFlex Calamine Unna Boot 4 x 6 (in/yd) Discharge Instruction: Apply Coflex [employer]amine [employer] as directed. Compression Stockings Add-Ons Electronic Signature(s) Signed: 07/03/2023 3:26:24 PM By: Samuella Bruin Entered By: Samuella Bruin on 07/03/2023 11:07:59 [name] ([id number]) [id number] Page 8 of 8 -------------------------------------------------------------------------------- Vitals Details Patient Name: Date of Service: NASHIYA, GREYNOLDS 07/03/2023 2:00 PM Medical Record Number: [id number] Patient Account Number: [id number] Date of Birth/Sex: Treating RN: 03-26-1946 (77 y.o. [name] Primary Care Ylianna Almanzar: Eustaquio Boyden Other Clinician: Referring Zayan Delvecchio: Treating Imre Vecchione/Extender: Priscille Loveless in Treatment: 48 Vital Signs Time Taken: 14:00 Temperature (F): 98.5 Height (in): 63 Pulse (bpm): 75 Weight (lbs): 200 Respiratory Rate (breaths/min): 18 Body Mass Index (BMI): 35.4 Blood Pressure (mmHg): 169/78 Reference Range: 80 - 120 mg / dl Electronic Signature(s) Signed: 07/03/2023 3:26:24 PM By: Samuella Bruin Entered By: Samuella Bruin on 07/03/2023 11:01:15

## 2023-07-09 ENCOUNTER — Ambulatory Visit: Payer: Medicare Other | Admitting: Physician Assistant

## 2023-07-10 ENCOUNTER — Encounter (HOSPITAL_BASED_OUTPATIENT_CLINIC_OR_DEPARTMENT_OTHER): Payer: Medicare Other | Attending: General Surgery | Admitting: General Surgery

## 2023-07-10 DIAGNOSIS — I10 Essential (primary) hypertension: Secondary | ICD-10-CM | POA: Insufficient documentation

## 2023-07-10 DIAGNOSIS — G629 Polyneuropathy, unspecified: Secondary | ICD-10-CM | POA: Insufficient documentation

## 2023-07-10 DIAGNOSIS — I871 Compression of vein: Secondary | ICD-10-CM | POA: Diagnosis not present

## 2023-07-10 DIAGNOSIS — L97222 Non-pressure chronic ulcer of left calf with fat layer exposed: Secondary | ICD-10-CM | POA: Diagnosis not present

## 2023-07-10 DIAGNOSIS — G4733 Obstructive sleep apnea (adult) (pediatric): Secondary | ICD-10-CM | POA: Insufficient documentation

## 2023-07-10 DIAGNOSIS — R600 Localized edema: Secondary | ICD-10-CM | POA: Diagnosis not present

## 2023-07-10 DIAGNOSIS — L97822 Non-pressure chronic ulcer of other part of left lower leg with fat layer exposed: Secondary | ICD-10-CM | POA: Insufficient documentation

## 2023-07-10 DIAGNOSIS — Z87891 Personal history of nicotine dependence: Secondary | ICD-10-CM | POA: Insufficient documentation

## 2023-07-10 DIAGNOSIS — I872 Venous insufficiency (chronic) (peripheral): Secondary | ICD-10-CM | POA: Diagnosis not present

## 2023-07-10 DIAGNOSIS — Z8249 Family history of ischemic heart disease and other diseases of the circulatory system: Secondary | ICD-10-CM | POA: Insufficient documentation

## 2023-07-10 DIAGNOSIS — I83893 Varicose veins of bilateral lower extremities with other complications: Secondary | ICD-10-CM | POA: Diagnosis not present

## 2023-07-14 ENCOUNTER — Other Ambulatory Visit: Payer: Self-pay | Admitting: Family Medicine

## 2023-07-14 DIAGNOSIS — G629 Polyneuropathy, unspecified: Secondary | ICD-10-CM

## 2023-07-15 NOTE — Telephone Encounter (Signed)
Name of Medication:  [facility] Name of Pharmacy:  [facility] Last Fill or Written Date and Quantity:  06/02/23, #90 Last Office Visit and Type:  11/16/22, cough f/u Next Office Visit and Type:  none Last Controlled Substance Agreement Date:  none Last UDS:  none

## 2023-07-16 NOTE — Telephone Encounter (Signed)
ERx 

## 2023-07-16 NOTE — Progress Notes (Signed)
YULY, ROHLFING ([id number]) [id number] Page 1 of 9 Visit Report for 07/10/2023 Arrival Information Details Patient Name: Date of Service: [name], [name] 07/10/2023 3:15 PM Medical Record Number: [id number] Patient Account Number: [id number] Date of Birth/Sex: Treating RN: [birth year] (77 y.o. [name] Primary Care Hendrik Donath: Eustaquio Boyden Other Clinician: Referring Guinn Delarosa: Treating Adah Stoneberg/Extender: Priscille Loveless in Treatment: 47 Visit Information History Since Last Visit All ordered tests and consults were completed: Yes Patient Arrived: Gilmer Mor Added or deleted any medications: No Arrival Time: 15:26 Any new allergies or adverse reactions: No Accompanied By: self Had a fall or experienced change in No Transfer Assistance: None activities of daily living that may affect Patient Requires Transmission-Based Precautions: No risk of falls: Patient Has Alerts: No Signs or symptoms of abuse/neglect since last visito No Hospitalized since last visit: No Implantable device outside of the clinic excluding No cellular tissue based products placed in the center since last visit: Has Dressing in Place as Prescribed: Yes Pain Present Now: No Electronic Signature(s) Signed: 07/16/2023 3:48:27 PM By: Brenton Grills Entered By: Brenton Grills on 07/10/2023 12:27:14 -------------------------------------------------------------------------------- Compression Therapy Details Patient Name: Date of Service: [name], [name] 07/10/2023 3:15 PM Medical Record Number: [id number] Patient Account Number: [id number] Date of Birth/Sex: Treating RN: [birth year] (77 y.o. [name] Primary Care Glayds Insco: Eustaquio Boyden Other Clinician: Referring Dalina Samara: Treating Sylva Overley/Extender: Priscille Loveless in Treatment: 25 Compression Therapy Performed for Wound Assessment: Wound #2 Left,Posterior Lower Leg Performed  By: Clinician Brenton Grills, RN Compression Type: Three Layer Post Procedure Diagnosis Same as Pre-procedure Electronic Signature(s) Signed: 07/16/2023 3:48:27 PM By: Brenton Grills Entered By: Brenton Grills on 07/10/2023 12:46:59 [name] ([id number]) [id number] Page 2 of 9 -------------------------------------------------------------------------------- Encounter Discharge Information Details Patient Name: Date of Service: [name], [name] 07/10/2023 3:15 PM Medical Record Number: [id number] Patient Account Number: [id number] Date of Birth/Sex: Treating RN: 1946/10/20 (77 y.o. [name] Primary Care Akshaj Besancon: Eustaquio Boyden Other Clinician: Referring Taylar Hartsough: Treating Lehman Whiteley/Extender: Priscille Loveless in Treatment: 9 Encounter Discharge Information Items Post Procedure Vitals Discharge Condition: Stable Temperature (F): 98.2 Ambulatory Status: Cane Pulse (bpm): 78 Discharge Destination: Home Respiratory Rate (breaths/min): 18 Transportation: Private Auto Blood Pressure (mmHg): 123/78 Accompanied By: self Schedule Follow-up Appointment: Yes Clinical Summary of Care: Patient Declined Electronic Signature(s) Signed: 07/16/2023 3:48:27 PM By: Brenton Grills Entered By: Brenton Grills on 07/10/2023 12:57:32 -------------------------------------------------------------------------------- Lower Extremity Assessment Details Patient Name: Date of Service: [name], [name] 07/10/2023 3:15 PM Medical Record Number: [id number] Patient Account Number: [id number] Date of Birth/Sex: Treating RN: 1946/01/07 (77 y.o. [name] Primary Care Zareah Hunzeker: Eustaquio Boyden Other Clinician: Referring Abbiegail Landgren: Treating Lenetta Piche/Extender: Priscille Loveless in Treatment: 49 Edema Assessment Assessed: [name]: No] Franne Forts: No] Edema: [Left: Ye] [Right: s] Calf Left: Right: Point of Measurement: 31 cm  From Medial Instep 44 cm Ankle Left: Right: Point of Measurement: 9 cm From Medial Instep 23.5 cm Vascular Assessment Pulses: Dorsalis Pedis Palpable: [Left:Yes] Extremity colors, hair growth, and conditions: Extremity Color: [Left:Normal] Hair Growth on Extremity: [Left:Yes] Temperature of Extremity: [Left:Warm] Capillary Refill: [Left:< 3 seconds] Dependent Rubor: [Left:No No] Toe Nail Assessment Left: Right: Thick: Yes Discolored: Yes Deformed: Yes Improper Length and Hygiene: Amber, [name] ([id number]) [id number] Page 3 of 9 Electronic Signature(s) Signed: 07/16/2023 3:48:27 PM By: Brenton Grills Entered By: Brenton Grills on 07/10/2023 12:35:30 -------------------------------------------------------------------------------- Multi Wound Chart Details Patient Name: Date of Service: [name]. 07/10/2023 3:15 PM Medical Record Number:  [id number] Patient Account Number: [id number] Date of Birth/Sex: Treating RN: 01/13/1946 (77 y.o. F) Primary Care Benisha Hadaway: Eustaquio Boyden Other Clinician: Referring Larhonda Dettloff: Treating Yuval Rubens/Extender: Priscille Loveless in Treatment: 49 Vital Signs Height(in): 63 Pulse(bpm): 69 Weight(lbs): 200 Blood Pressure(mmHg): 137/88 Body Mass Index(BMI): 35.4 Temperature(F): 98.4 Respiratory Rate(breaths/min): 18 [2:Photos:] [N/A:N/A] Left, Posterior Lower Leg N/A N/A Wound Location: Blister N/A N/A Wounding Event: Venous Leg Ulcer N/A N/A Primary Etiology: Cataracts, Asthma, Hypertension, N/A N/A Comorbid History: Neuropathy, Received Chemotherapy, Received Radiation 07/06/2022 N/A N/A Date Acquired: 64 N/A N/A Weeks of Treatment: Open N/A N/A Wound Status: No N/A N/A Wound Recurrence: 3.5x1.5x0.1 N/A N/A Measurements L x W x D (cm) 4.123 N/A N/A A (cm) : rea 0.412 N/A N/A Volume (cm) : 40.00% N/A N/A % Reduction in A rea: 40.00% N/A N/A % Reduction in  Volume: Full Thickness Without Exposed N/A N/A Classification: Support Structures Medium N/A N/A Exudate A mount: Serosanguineous N/A N/A Exudate Type: red, brown N/A N/A Exudate Color: Distinct, outline attached N/A N/A Wound Margin: Small (1-33%) N/A N/A Granulation A mount: Red N/A N/A Granulation Quality: Large (67-100%) N/A N/A Necrotic A mount: Eschar, Adherent Slough N/A N/A Necrotic Tissue: Fat Layer (Subcutaneous Tissue): Yes N/A N/A Exposed Structures: Fascia: No Tendon: No Muscle: No Joint: No Bone: No Small (1-33%) N/A N/A Epithelialization: Debridement - Selective/Open Wound N/A N/A Debridement: Pre-procedure Verification/Time Out 15:45 N/A N/A Taken: Lidocaine 4% Topical Solution N/A N/A Pain Control: Slough N/A N/A Tissue Debrided: Non-Viable Tissue N/A N/A Level: 4.12 N/A N/A Debridement A (sq cm): rea Curette N/A N/A InstrumentMACLAREN, VAYNSHTEYN ([id number]) [id number] Page 4 of 9 Minimum N/A N/A Bleeding: Pressure N/A N/A Hemostasis Achieved: Procedure was tolerated well N/A N/A Debridement Treatment Response: 3.5x1.5x0.1 N/A N/A Post Debridement Measurements L x W x D (cm) 0.412 N/A N/A Post Debridement Volume: (cm) Scarring: Yes N/A N/A Periwound Skin Texture: Maceration: No N/A N/A Periwound Skin Moisture: Dry/Scaly: No Rubor: Yes N/A N/A Periwound Skin Color: No Abnormality N/A N/A Temperature: Yes N/A N/A Tenderness on Palpation: Compression Therapy N/A N/A Procedures Performed: Debridement Treatment Notes Wound #2 (Lower Leg) Wound Laterality: Left, Posterior Cleanser Soap and Water Discharge Instruction: May shower and wash wound with dial antibacterial soap and water prior to dressing change. Peri-Wound Care Ketoconazole Cream 2% Discharge Instruction: Apply Ketoconazole as directed Triamcinolone 15 (g) Discharge Instruction: Use triamcinolone 15 (g) as directed Zinc Oxide Ointment 30g  tube Discharge Instruction: Apply Zinc Oxide to periwound with each dressing change Sween Lotion (Moisturizing lotion) Discharge Instruction: Apply moisturizing lotion as directed Topical Gentamicin Discharge Instruction: As directed by physician Mupirocin Ointment Discharge Instruction: Apply Mupirocin (Bactroban) as instructed Skintegrity Hydrogel 4 (oz) Discharge Instruction: Apply hydrogel as directed Primary Dressing Endoform 2x2 in Discharge Instruction: Moisten with saline Secondary Dressing Optifoam Non-Adhesive Dressing, 4x4 in Discharge Instruction: Apply over primary dressing as directed. Zetuvit Plus 4x8 in Discharge Instruction: Apply over primary dressing as directed. Secured With [employer] T 2x10 (in/yd) ape Discharge Instruction: Secure with tape as directed. Compression Wrap CoFlex Calamine Unna Boot 4 x 6 (in/yd) Discharge Instruction: Apply Coflex [employer]amine [employer] as directed. Compression Stockings Add-Ons Electronic Signature(s) Signed: 07/10/2023 4:36:51 PM By: Duanne Guess MD FACS Entered By: Duanne Guess on 07/10/2023 13:36:51 Rayle, [name] ([id number]) [id number] Page 5 of 9 -------------------------------------------------------------------------------- Multi-Disciplinary Care Plan Details Patient Name: Date of Service: ANDILYNN, GUARDIOLA 07/10/2023 3:15 PM Medical Record Number: [id number] Patient Account Number: [id number] Date of Birth/Sex:  Treating RN: 15-Nov-1945 (77 y.o. [name] Primary Care Shamarion Coots: Eustaquio Boyden Other Clinician: Referring Alann Avey: Treating Brayon Bielefeld/Extender: Priscille Loveless in Treatment: 62 Multidisciplinary Care Plan reviewed with physician Active Inactive Venous Leg Ulcer Nursing Diagnoses: Knowledge deficit related to disease process and management Potential for venous Insuffiency (use before diagnosis  confirmed) Goals: Patient will maintain optimal edema control Date Initiated: 02/07/2023 Target Resolution Date: 08/02/2023 Goal Status: Active Interventions: Assess peripheral edema status every visit. Compression as ordered Treatment Activities: Therapeutic compression applied : 02/07/2023 Notes: Wound/Skin Impairment Nursing Diagnoses: Impaired tissue integrity Goals: Ulcer/skin breakdown will have a volume reduction of 30% by week 4 Date Initiated: 08/03/2022 Date Inactivated: 09/10/2022 Target Resolution Date: 08/31/2022 Unmet Reason: uncontrolled edema, Goal Status: Unmet acute infection Ulcer/skin breakdown will have a volume reduction of 50% by week 8 Date Initiated: 09/10/2022 Target Resolution Date: 08/02/2023 Goal Status: Active Interventions: Assess ulceration(s) every visit Treatment Activities: Skin care regimen initiated : 08/03/2022 Notes: [place]stone ordered. 10/30 RX Levo started. 09/10/22 Electronic Signature(s) Signed: 07/16/2023 3:48:27 PM By: Brenton Grills Entered By: Brenton Grills on 07/10/2023 12:40:00 Pain Assessment Details -------------------------------------------------------------------------------- [name] ([id number]) [id number] Page 6 of 9 Patient Name: Date of Service: ALVIA, WISHAM 07/10/2023 3:15 PM Medical Record Number: [id number] Patient Account Number: [id number] Date of Birth/Sex: Treating RN: 26-Aug-1946 (77 y.o. [name] Primary Care Foday Cone: Eustaquio Boyden Other Clinician: Referring Breyona Swander: Treating Braelyn Jenson/Extender: Priscille Loveless in Treatment: 60 Active Problems Location of Pain Severity and Description of Pain Patient Has Paino No Site Locations Pain Management and Medication Current Pain Management: Electronic Signature(s) Signed: 07/16/2023 3:48:27 PM By: Brenton Grills Entered By: Brenton Grills on 07/10/2023  12:29:25 -------------------------------------------------------------------------------- Patient/Caregiver Education Details Patient Name: Date of Service: [name] 9/4/2024andnbsp3:15 PM Medical Record Number: [id number] Patient Account Number: [id number] Date of Birth/Gender: Treating RN: May 24, 1946 (77 y.o. [name] Primary Care Physician: Eustaquio Boyden Other Clinician: Referring Physician: Treating Physician/Extender: Priscille Loveless in Treatment: 20 Education Assessment Education Provided To: Patient Education Topics Provided Wound/Skin Impairment: Methods: Explain/Verbal Responses: State content correctly Electronic Signature(s) Signed: 07/16/2023 3:48:27 PM By: Brenton Grills Entered By: Brenton Grills on 07/10/2023 12:42:09 [name] ([id number]) [id number] Page 7 of 9 -------------------------------------------------------------------------------- Wound Assessment Details Patient Name: Date of Service: ASHA, LEVENDUSKY 07/10/2023 3:15 PM Medical Record Number: [id number] Patient Account Number: [id number] Date of Birth/Sex: Treating RN: 06/20/1946 (77 y.o. [name] Primary Care Shineka Auble: Eustaquio Boyden Other Clinician: Referring Elinora Weigand: Treating Lessly Stigler/Extender: Katharina Caper Weeks in Treatment: 49 Wound Status Wound Number: 2 Primary Venous Leg Ulcer Etiology: Wound Location: Left, Posterior Lower Leg Wound Open Wounding Event: Blister Status: Date Acquired: 07/06/2022 Comorbid Cataracts, Asthma, Hypertension, Neuropathy, Received Weeks Of Treatment: 49 History: Chemotherapy, Received Radiation Clustered Wound: No Photos Wound Measurements Length: (cm) 3.5 Width: (cm) 1.5 Depth: (cm) 0.1 Area: (cm) 4.123 Volume: (cm) 0.412 % Reduction in Area: 40% % Reduction in Volume: 40% Epithelialization: Small (1-33%) Tunneling: No Undermining: No Wound  Description Classification: Full Thickness Without Exposed Suppor Wound Margin: Distinct, outline attached Exudate Amount: Medium Exudate Type: Serosanguineous Exudate Color: red, brown t Structures Foul Odor After Cleansing: No Slough/Fibrino Yes Wound Bed Granulation Amount: Small (1-33%) Exposed Structure Granulation Quality: Red Fascia Exposed: No Necrotic Amount: Large (67-100%) Fat Layer (Subcutaneous Tissue) Exposed: Yes Necrotic Quality: Eschar, Adherent Slough Tendon Exposed: No Muscle Exposed: No Joint Exposed: No Bone Exposed: No Periwound Skin Texture Texture Color No Abnormalities Noted: Yes No  Abnormalities Noted: No Rubor: Yes Moisture No Abnormalities Noted: Yes Temperature / Pain Temperature: No Abnormality Tenderness on Palpation: Yes Treatment Notes Wound #2 (Lower Leg) Wound Laterality: Left, Posterior Cleanser SHONELLE, TILLMON ([id number]) [id number] Page 8 of 9 Soap and Water Discharge Instruction: May shower and wash wound with dial antibacterial soap and water prior to dressing change. Peri-Wound Care Ketoconazole Cream 2% Discharge Instruction: Apply Ketoconazole as directed Triamcinolone 15 (g) Discharge Instruction: Use triamcinolone 15 (g) as directed Zinc Oxide Ointment 30g tube Discharge Instruction: Apply Zinc Oxide to periwound with each dressing change Sween Lotion (Moisturizing lotion) Discharge Instruction: Apply moisturizing lotion as directed Topical Gentamicin Discharge Instruction: As directed by physician Mupirocin Ointment Discharge Instruction: Apply Mupirocin (Bactroban) as instructed Skintegrity Hydrogel 4 (oz) Discharge Instruction: Apply hydrogel as directed Primary Dressing Endoform 2x2 in Discharge Instruction: Moisten with saline Secondary Dressing Optifoam Non-Adhesive Dressing, 4x4 in Discharge Instruction: Apply over primary dressing as directed. Zetuvit Plus 4x8 in Discharge  Instruction: Apply over primary dressing as directed. Secured With [employer] T 2x10 (in/yd) ape Discharge Instruction: Secure with tape as directed. Compression Wrap CoFlex Calamine Unna Boot 4 x 6 (in/yd) Discharge Instruction: Apply Coflex Calamine [employer] as directed. Compression Stockings Add-Ons Electronic Signature(s) Signed: 07/16/2023 3:48:27 PM By: Brenton Grills Entered By: Brenton Grills on 07/10/2023 12:37:35 -------------------------------------------------------------------------------- Vitals Details Patient Name: Date of Service: CAREN, STARZYK 07/10/2023 3:15 PM Medical Record Number: [id number] Patient Account Number: [id number] Date of Birth/Sex: Treating RN: June 18, 1946 (77 y.o. [name] Primary Care Caydyn Sprung: Eustaquio Boyden Other Clinician: Referring Tamiyah Moulin: Treating Temple Sporer/Extender: Priscille Loveless in Treatment: 49 Vital Signs Time Taken: 15:28 Temperature (F): 98.4 Height (in): 63 Pulse (bpm): 69 Weight (lbs): 200 Respiratory Rate (breaths/min): 18 Body Mass Index (BMI): 35.4 Blood Pressure (mmHg): 137/88 Reference Range: 80 - 120 mg / dl MEILI, RIGGAN ([id number]) [id number] Page 9 of 9 Electronic Signature(s) Signed: 07/16/2023 3:48:27 PM By: Brenton Grills Entered By: Brenton Grills on 07/10/2023 12:28:48

## 2023-07-16 NOTE — Progress Notes (Signed)
Amber, [name] ([id number]) [id number] Page 1 of 12 Visit Report for 07/10/2023 Chief Complaint Document Details Patient Name: Date of Service: [name], [name] 07/10/2023 3:15 PM Medical Record Number: [id number] Patient Account Number: [id number] Date of Birth/Sex: Treating RN: 05-06-1946 (77 y.o. F) Primary Care Provider: Eustaquio Boyden Other Clinician: Referring Provider: Treating Provider/Extender: Priscille Loveless in Treatment: 24 Information Obtained from: Patient Chief Complaint Patient presents for treatment of an open ulcer due to venous insufficiency Electronic Signature(s) Signed: 07/10/2023 4:36:59 PM By: Duanne Guess MD FACS Entered By: Duanne Guess on 07/10/2023 13:36:59 -------------------------------------------------------------------------------- Debridement Details Patient Name: Date of Service: [name], [name] 07/10/2023 3:15 PM Medical Record Number: [id number] Patient Account Number: [id number] Date of Birth/Sex: Treating RN: 14-Jun-1946 (77 y.o. [name] Primary Care Provider: Eustaquio Boyden Other Clinician: Referring Provider: Treating Provider/Extender: Priscille Loveless in Treatment: 49 Debridement Performed for Assessment: Wound #2 Left,Posterior Lower Leg Performed By: Physician Duanne Guess, MD Debridement Type: Debridement Severity of Tissue Pre Debridement: Fat layer exposed Level of Consciousness (Pre-procedure): Awake and Alert Pre-procedure Verification/Time Out Yes - 15:45 Taken: Start Time: 15:46 Pain Control: Lidocaine 4% T opical Solution Percent of Wound Bed Debrided: 100% T Area Debrided (cm): otal 4.12 Tissue and other material debrided: Non-Viable, Slough, Slough Level: Non-Viable Tissue Debridement Description: Selective/Open Wound Instrument: Curette Bleeding: Minimum Hemostasis Achieved: Pressure Response to Treatment: Procedure was  tolerated well Level of Consciousness (Post- Awake and Alert procedure): Post Debridement Measurements of Total Wound Length: (cm) 3.5 Width: (cm) 1.5 Depth: (cm) 0.1 Volume: (cm) 0.412 Character of Wound/Ulcer Post Debridement: Improved Severity of Tissue Post Debridement: Fat layer exposed Post Procedure Diagnosis JAKE, ZARRELLA ([id number]) [id number] Page 2 of 12 Same as Pre-procedure Notes Scribed for Dr. Lady Gary by Brenton Grills, RN Electronic Signature(s) Signed: 07/10/2023 5:03:18 PM By: Duanne Guess MD FACS Signed: 07/16/2023 3:48:27 PM By: Brenton Grills Entered By: Brenton Grills on 07/10/2023 12:46:29 -------------------------------------------------------------------------------- HPI Details Patient Name: Date of Service: Amber, [name] 07/10/2023 3:15 PM Medical Record Number: [id number] Patient Account Number: [id number] Date of Birth/Sex: Treating RN: [birth year] (77 y.o. F) Primary Care Provider: Eustaquio Boyden Other Clinician: Referring Provider: Treating Provider/Extender: Priscille Loveless in Treatment: 53 History of Present Illness HPI Description: ADMISSION This is a 77 year old woman with a history of chronic venous insufficiency status post saphenous vein ablations in 2010 and 2016. 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 She presents today with an open venous ulcer on her left lower leg. It has been present for a little over 2 weeks. She saw her primary care provider who apparently swabbed the wound and grew out Pseudomonas. She just completed a course of ciprofloxacin for this. ABI was 0.91. She reports that she has had previous issues with ulcers in this same location, stemming back to a punch biopsy taken by a dermatologist many years ago. She has had several skin substitutes applied to the area that have ultimately resulted in healing on prior occasions. She has 2 small  ulcers on her left medial lower leg. There is slough accumulation in both of them. The more anterior of the 2 is quite small and has some soft slough on the surface, underneath which there is good granulation tissue. The more medial wound also has slough accumulation, but the underlying surface is fairly fibrotic and gritty. This is consistent with her provided history of multiple ulcers in the same location. 08/03/2022: The anterior wound is smaller today  with just a little bit of slough accumulation. The more medial wound continues to be very sensitive and fibrotic with slough buildup. 08/13/2022: The anterior wound is closed. The more medial wound remains sensitive with a fairly fibrotic surface. There is more granulation tissue filling in, however, and there is less slough than on prior occasions. 08/20/2022: The anterior wound remains closed. The more medial wound has filled with granulation tissue but still has a fair amount of slough on the surface and remains fairly tender. Unfortunately, she has developed 2 small ulcers just proximal to this. The fat layer is exposed in each. She says that over the weekend, she felt a burning sensation in that location. 08/27/2022: The 2 small wounds proximal to the main wound have merged into a single site. The wounds look a little bit dry, but they are quite clean without significant slough accumulation. They remain quite tender. 09/03/2022: All of the wounds have now merged into 1 large wound. There is a strong odor coming from the wound and the surface is not particularly viable. It is extremely painful for her today. 09/10/2022: The wound is less black and purple this week but still does not look particularly viable. The surface is desiccated. The culture that I took last week returned with a polymicrobial population including Pseudomonas. I prescribed both Augmentin and levofloxacin. She has not yet picked up levofloxacin, as her pharmacy only notified  her yesterday that it was ready. We have ordered a [facility] topical compounded antibiotic, but it has not yet come. 09/17/2022: The wound surface is markedly improved today. There is still an area of grayish-looking muscle but the rest appears significantly more viable. There is still a layer of slough on the surface. She still has a couple days left of oral antibiotic therapy. She has her [name] compounded topical antibiotic with her today. 09/24/2022: She has completed her oral antibiotic therapy. The wound surface is much cleaner today and more viable without any necrotic tissue. It is a bit desiccated, however. 10/01/2022: The moisture balance of the wound has improved. There is a layer of yellow slough on the surface, but beneath this, the wound is more pink. 10/08/2022: The wound is smaller and cleaner today with a layer of slough present. She is having less pain. 12/18; this patient has a new wound on the right lateral ankle which apparently started after a cat scratched her. Secondarily she managed to drop a cake pan on the same area. This is very painful. The original wound was on the left posterior calf we have been using [employer] under an [employer]. The Unna boot fell down and apparently she was in for a nurse visit perhaps last week and that 1 fell down as well This patient has central venous mediated hypertension. For member correctly she has a stent placed in the left common iliac artery by Dr. Randie Heinz some years ago [facility] Thurner syndrome] she also has had venous ablations and I believe a history of a DVT The patient has compression pumps at home but she does not use them 10/30/2022: The patient says that her wounds burned all week with the [facility] classic in place. She has a fair amount of slough and nonviable MARLEANA, ALMQUIST ([id number]) [id number] Page 3 of 12 subcutaneous tissue present on the right ankle. The left posterior calf wound is  cleaner than I have seen it to date. Both remain fairly tender. 11/14/2022: Both wounds have substantial slough and eschar accumulation. They remain extremely  tender. 11/21/2022: The wounds are cleaner this week, but still have slough and eschar accumulation. She continues to describe burning pain in both wounds, but upon further questioning, she actually has burning in both of her feet that radiates up her legs and includes to the wounds. 11/28/2022: Both wounds have slough and eschar accumulation, as well as adherent silver alginate that was difficult to remove. She continues to have pain out of proportion to the extent of her wounds. Her PCP did initiate Lyrica which she started taking last night. The dose is quite low, only 25 mg, but apparently there is a plan for upward titration, assuming she tolerates the medication. 12/05/2022: The wounds are both a little bit smaller today, but have significant slough accumulation, as usual. They remain exquisitely tender. She is now taking Lyrica twice a day. 12/19/2022: Both of her wounds look significantly better this week. There is slough buildup on both, but they are smaller. She seems to be getting a good response from Lyrica as she is having much less pain. 12/26/2022: The wound on her right lateral ankle is nearly closed. The wound on her left posterior calf is smaller but still has a lot of slough accumulation. They remain tender. 01/02/2023: The right lateral ankle wound is down to just a couple of millimeters. It is much less tender than on prior occasions. There is minimal slough buildup. The wound on her posterior calf continues to contract, as well. It has thick slough buildup and remains fairly tender. 01/09/2023: The right lateral ankle wound is nearly closed. He remains tender. There is just a little eschar on the surface. The wound on her posterior calf has improved quite a bit. There is still slough on the surface, but the more proximal area has  epithelialized substantially and there is no longer any gray discoloration to her tissue. 3/13; patient presents for follow-up. Her right lateral ankle wound is almost healed. She has a wound to her left posterior calf with slough and granulation tissue. We have been using Santyl under Unna boots bilaterally to the lower extremities. She has no issues or complaints today. 01/30/2023: The right lateral ankle wound is down to just a pinhole under a layer of eschar. The left posterior calf wound is quite a bit improved since the last time I saw it. There is still a layer of slough on the surface but there has been more epithelialization. 02/07/2023: The right lateral ankle wound still has some eschar on the surface. The left posterior calf wound is about the same size, but with more areas of epithelialization. 02/20/2023: The right lateral ankle wound is healed. The left posterior calf wound is smaller and is essentially flush with the surrounding skin, but the surface is quite dry. Her leg wrap slipped and her calf is quite swollen above the level of the wound. 03/01/2023: The right lateral ankle wound reopened and she also suffered a fall that resulted in a small wound on her right anterior tibial surface. The left posterior calf wound has a much better moisture balance today. There is a layer of slough on the surface. The [employer] worked much better for her as far as compression this week. 03/06/2023: The right lateral ankle wound has a layer of eschar on the surface and is too painful for me to debride. The anterior tibial surface wound is more or less healed; it is a very superficial abrasion at this point. The left posterior calf wound has a layer of slough on  the surface. Her edema control is better. 03/13/2023: The right lateral ankle wound has a layer of eschar on the surface. Once this was debrided, it was found to be healed. The anterior tibial surface wound has a layer of stuck silver alginate on  the surface. This was removed to reveal a very superficial abrasion but not much change from last week. The left posterior calf wound is smaller and has its usual layer of accumulated slough. 03/25/2023: The anterior tibial surface wound has eschar and silver alginate stuck to it. Once this was removed, the wound was found to be healed. The left posterior calf wound is roughly the same size and has a little bit thicker layer of accumulated slough. 04/03/2023: She has had more drainage from the posterior calf wound over the last week and the layer of slough is thicker. She also says it is more tender. 04/10/2023: Continued increase in drainage from her wound. The wound remains tender with a layer of slough on the surface. It does not appear as viable this week. She also has erythema, swelling, and warmth in her entire left lower leg extending into the midfoot. 04/17/2023: The wound is about the same size this week. There is fibrotic adherent slough, but the drainage is not as profuse. She is currently taking levofloxacin. She is complaining of an increase in the burning sensation at her wound. 04/24/2023: The wound measured smaller this week and is much cleaner-looking. She completed her course of levofloxacin. Her PCP increased her dose of Lyrica and she reports that the burning has stopped. 05/01/2023: The wound is smaller again today and quite clean with only minimal slough on the surface. 05/08/2023: The wound is smaller again today. It is fairly clean, but the surface is a bit fibrotic. Moisture balance is better. Her leg is more swollen and erythematous today, however. 05/15/2023: The wound is about the same size today. The surface has very little slough accumulation, but remains fibrotic. Moisture balance is good. She is responding nicely to the oral Keflex she is taking and her leg is less erythematous and edematous. 05/22/2023: The wound is unchanged in size. There is quite a bit of slough on the  surface but the moisture balance is better. It is still fibrotic, but a little bit less so. Her leg is no longer erythematous, but she does have some persistent edema. 05/29/2023: The wound measured a little bit smaller today. It is dry again and the surface is quite fibrotic. The edema in her leg has improved. 06/12/2023: The wound measures slightly larger today. The moisture balance has improved markedly. There is soft slough on the wound surface. Edema control is improved. 06/19/2023: The wound measured slightly smaller today. There is a band of epithelium dividing the most proximal portion of the wound from the remainder. There is slough accumulation and moisture balance is improved. 06/26/2023: The band of epithelium between the 2 open areas has gotten wider. There is some slough accumulation on the surfaces along with a little bit of eschar. Moisture balance continues to be good. 07/03/2023: The band of epithelium continues to expand. The moisture balance remains good and the surface, particularly of the more distal aspect of the wound continues to improve. Slough and eschar accumulation, as usual. 07/10/2023: The wound measured smaller today. Moisture balance is good and the tissue underneath the layer of slough that has accumulated continues to improve. SILVER, WILLEMS ([id number]) [id number] Page 4 of 12 Electronic Signature(s) Signed: 07/10/2023 4:37:34 PM By: Duanne Guess  MD FACS Entered By: Duanne Guess on 07/10/2023 13:37:34 -------------------------------------------------------------------------------- Physical Exam Details Patient Name: Date of Service: [name], [name] 07/10/2023 3:15 PM Medical Record Number: [id number] Patient Account Number: [id number] Date of Birth/Sex: Treating RN: 06/08/1946 (77 y.o. F) Primary Care Provider: Eustaquio Boyden Other Clinician: Referring Provider: Treating Provider/Extender: Priscille Loveless in Treatment: 49 Constitutional . . . . no acute distress. Respiratory Normal work of breathing on room air. Notes 07/10/2023: The wound measured smaller today. Moisture balance is good and the tissue underneath the layer of slough that has accumulated continues to improve. Electronic Signature(s) Signed: 07/10/2023 4:40:29 PM By: Duanne Guess MD FACS Entered By: Duanne Guess on 07/10/2023 13:40:29 -------------------------------------------------------------------------------- Physician Orders Details Patient Name: Date of Service: KHAILAH, BOYLE 07/10/2023 3:15 PM Medical Record Number: [id number] Patient Account Number: [id number] Date of Birth/Sex: Treating RN: 01/27/1946 (77 y.o. [name] Primary Care Provider: Eustaquio Boyden Other Clinician: Referring Provider: Treating Provider/Extender: Priscille Loveless in Treatment: 65 Verbal / Phone Orders: No Diagnosis Coding ICD-10 Coding Code Description [phone] Non-pressure chronic ulcer of other part of left lower leg with fat layer exposed I83.893 Varicose veins of bilateral lower extremities with other complications I87.2 Venous insufficiency (chronic) (peripheral) G62.9 Polyneuropathy, unspecified R60.0 Localized edema Follow-up Appointments ppointment in 1 week. - Dr. Lady Gary Room 3 Return A Anesthetic Wound #2 Left,Posterior Lower Leg (In clinic) Topical Lidocaine 4% applied to wound bed Bathing/ Shower/ Hygiene May shower with protection but do not get wound dressing(s) wet. Protect dressing(s) with water repellant cover (for example, large plastic BRUCE, MARTINDALE ([id number]) [id number] Page 5 of 12 bag) or a cast cover and may then take shower. Edema Control - Lymphedema / SCD / Other Left Lower Extremity Lymphedema Pumps. Use Lymphedema pumps on leg(s) 2-3 times a day for 45-60 minutes. If wearing any wraps or hose, do not remove them.  Continue exercising as instructed. - Use x1 per day Avoid standing for long periods of time. Exercise regularly Off-Loading Wound #2 Left,Posterior Lower Leg Other: - Elevate legs at heart level or above heart level while sitting. Wound Treatment Wound #2 - Lower Leg Wound Laterality: Left, Posterior Cleanser: Soap and Water 1 x Per Week/30 Days Discharge Instructions: May shower and wash wound with dial antibacterial soap and water prior to dressing change. Peri-Wound Care: Ketoconazole Cream 2% 1 x Per Week/30 Days Discharge Instructions: Apply Ketoconazole as directed Peri-Wound Care: Triamcinolone 15 (g) 1 x Per Week/30 Days Discharge Instructions: Use triamcinolone 15 (g) as directed Peri-Wound Care: Zinc Oxide Ointment 30g tube 1 x Per Week/30 Days Discharge Instructions: Apply Zinc Oxide to periwound with each dressing change Peri-Wound Care: Sween Lotion (Moisturizing lotion) 1 x Per Week/30 Days Discharge Instructions: Apply moisturizing lotion as directed Topical: Gentamicin 1 x Per Week/30 Days Discharge Instructions: As directed by physician Topical: Mupirocin Ointment 1 x Per Week/30 Days Discharge Instructions: Apply Mupirocin (Bactroban) as instructed Topical: Skintegrity Hydrogel 4 (oz) 1 x Per Week/30 Days Discharge Instructions: Apply hydrogel as directed Prim Dressing: Endoform 2x2 in 1 x Per Week/30 Days ary Discharge Instructions: Moisten with saline Secondary Dressing: Optifoam Non-Adhesive Dressing, 4x4 in 1 x Per Week/30 Days Discharge Instructions: Apply over primary dressing as directed. Secondary Dressing: Zetuvit Plus 4x8 in 1 x Per Week/30 Days Discharge Instructions: Apply over primary dressing as directed. Secured With: 35M Medipore Scientist, research (life sciences) Surgical T 2x10 (in/yd) 1 x Per Week/30 Days ape Discharge Instructions: Secure with tape as directed. Compression Wrap:  CoFlex Calamine Unna Boot 4 x 6 (in/yd) 1 x Per Week/30 Days Discharge Instructions: Apply  Coflex [employer]amine [employer] as directed. Electronic Signature(s) Signed: 07/10/2023 5:03:18 PM By: Duanne Guess MD FACS Entered By: Duanne Guess on 07/10/2023 13:41:05 -------------------------------------------------------------------------------- Problem List Details Patient Name: Date of Service: CHANLER, ROSENAU 07/10/2023 3:15 PM Medical Record Number: [id number] Patient Account Number: [id number] Date of Birth/Sex: Treating RN: 07/24/1946 (77 y.o. [name] Primary Care Provider: Eustaquio Boyden Other Clinician: Referring Provider: Treating Provider/Extender: Priscille Loveless in Treatment: [address], [place] ([id number]) [id number] Page 6 of 12 Active Problems ICD-10 Encounter Code Description Active Date MDM Diagnosis L97.822 Non-pressure chronic ulcer of other part of left lower leg with fat layer exposed 07/27/2022 No Yes I83.893 Varicose veins of bilateral lower extremities with other complications 07/27/2022 No Yes I87.2 Venous insufficiency (chronic) (peripheral) 07/27/2022 No Yes G62.9 Polyneuropathy, unspecified 07/27/2022 No Yes R60.0 Localized edema 07/27/2022 No Yes Inactive Problems ICD-10 Code Description Active Date Inactive Date L03.115 Cellulitis of right lower limb 10/22/2022 10/22/2022 L97.818 Non-pressure chronic ulcer of other part of right lower leg with other specified severity 03/01/2023 10/22/2022 Resolved Problems Electronic Signature(s) Signed: 07/10/2023 4:36:44 PM By: Duanne Guess MD FACS Entered By: Duanne Guess on 07/10/2023 13:36:44 -------------------------------------------------------------------------------- Progress Note Details Patient Name: Date of Service: TUMEKA, ASELTINE 07/10/2023 3:15 PM Medical Record Number: [id number] Patient Account Number: [id number] Date of Birth/Sex: Treating RN: 1945/12/20 (77 y.o. F) Primary Care Provider: Eustaquio Boyden Other  Clinician: Referring Provider: Treating Provider/Extender: Priscille Loveless in Treatment: 45 Subjective Chief Complaint Information obtained from Patient Patient presents for treatment of an open ulcer due to venous insufficiency History of Present Illness (HPI) ADMISSION This is a 77 year old woman with a history of chronic venous insufficiency status post saphenous vein ablations in 2010 and 2016. 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 She presents today with an open venous ulcer on her left lower leg. It has been present for a little over 2 weeks. She saw her primary care provider who apparently swabbed the wound and grew out Pseudomonas. She just completed a course of ciprofloxacin for this. ABI was 0.91. She reports that she has had previous issues with ulcers in this same location, stemming back to a punch biopsy taken by a dermatologist many years ago. She has had several skin substitutes applied to the area that have ultimately resulted in healing on prior occasions. She has 2 small ulcers on her left medial lower leg. There is slough accumulation in both of them. The more anterior of the 2 is quite small and has some soft TRUBY, KILSON ([id number]) [id number] Page 7 of 12 slough on the surface, underneath which there is good granulation tissue. The more medial wound also has slough accumulation, but the underlying surface is fairly fibrotic and gritty. This is consistent with her provided history of multiple ulcers in the same location. 08/03/2022: The anterior wound is smaller today with just a little bit of slough accumulation. The more medial wound continues to be very sensitive and fibrotic with slough buildup. 08/13/2022: The anterior wound is closed. The more medial wound remains sensitive with a fairly fibrotic surface. There is more granulation tissue filling in, however, and there is less slough  than on prior occasions. 08/20/2022: The anterior wound remains closed. The more medial wound has filled with granulation tissue but still has a fair amount of slough on the surface and remains fairly tender.  Unfortunately, she has developed 2 small ulcers just proximal to this. The fat layer is exposed in each. She says that over the weekend, she felt a burning sensation in that location. 08/27/2022: The 2 small wounds proximal to the main wound have merged into a single site. The wounds look a little bit dry, but they are quite clean without significant slough accumulation. They remain quite tender. 09/03/2022: All of the wounds have now merged into 1 large wound. There is a strong odor coming from the wound and the surface is not particularly viable. It is extremely painful for her today. 09/10/2022: The wound is less black and purple this week but still does not look particularly viable. The surface is desiccated. The culture that I took last week returned with a polymicrobial population including Pseudomonas. I prescribed both Augmentin and levofloxacin. She has not yet picked up levofloxacin, as her pharmacy only notified her yesterday that it was ready. We have ordered a [facility] topical compounded antibiotic, but it has not yet come. 09/17/2022: The wound surface is markedly improved today. There is still an area of grayish-looking muscle but the rest appears significantly more viable. There is still a layer of slough on the surface. She still has a couple days left of oral antibiotic therapy. She has her [name] compounded topical antibiotic with her today. 09/24/2022: She has completed her oral antibiotic therapy. The wound surface is much cleaner today and more viable without any necrotic tissue. It is a bit desiccated, however. 10/01/2022: The moisture balance of the wound has improved. There is a layer of yellow slough on the surface, but beneath this, the wound is more pink. 10/08/2022:  The wound is smaller and cleaner today with a layer of slough present. She is having less pain. 12/18; this patient has a new wound on the right lateral ankle which apparently started after a cat scratched her. Secondarily she managed to drop a cake pan on the same area. This is very painful. The original wound was on the left posterior calf we have been using [employer] under an [employer]. The Unna boot fell down and apparently she was in for a nurse visit perhaps last week and that 1 fell down as well This patient has central venous mediated hypertension. For member correctly she has a stent placed in the left common iliac artery by Dr. 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 She has a fair amount of slough and nonviable subcutaneous tissue present on the right ankle. The left posterior calf wound is cleaner than I have seen it to date. Both remain fairly tender. 11/14/2022: Both wounds have substantial slough and eschar accumulation. They remain extremely tender. 11/21/2022: The wounds are cleaner this week, but still have slough and eschar accumulation. She continues to describe burning pain in both wounds, but upon further questioning, she actually has burning in both of her feet that radiates up her legs and includes to the wounds. 11/28/2022: Both wounds have slough and eschar accumulation, as well as adherent silver alginate that was difficult to remove. She continues to have pain out of proportion to the extent of her wounds. Her PCP did initiate Lyrica which she started taking last night. The dose is quite low,  only 25 mg, but apparently there is a plan for upward titration, assuming she tolerates the medication. 12/05/2022: The wounds are both a little  bit smaller today, but have significant slough accumulation, as usual. They remain exquisitely tender. She is now taking Lyrica twice a day. 12/19/2022: Both of her wounds look significantly better this week. There is slough buildup on both, but they are smaller. She seems to be getting a good response from Lyrica as she is having much less pain. 12/26/2022: The wound on her right lateral ankle is nearly closed. The wound on her left posterior calf is smaller but still has a lot of slough accumulation. They remain tender. 01/02/2023: The right lateral ankle wound is down to just a couple of millimeters. It is much less tender than on prior occasions. There is minimal slough buildup. The wound on her posterior calf continues to contract, as well. It has thick slough buildup and remains fairly tender. 01/09/2023: The right lateral ankle wound is nearly closed. He remains tender. There is just a little eschar on the surface. The wound on her posterior calf has improved quite a bit. There is still slough on the surface, but the more proximal area has epithelialized substantially and there is no longer any gray discoloration to her tissue. 3/13; patient presents for follow-up. Her right lateral ankle wound is almost healed. She has a wound to her left posterior calf with slough and granulation tissue. We have been using Santyl under Unna boots bilaterally to the lower extremities. She has no issues or complaints today. 01/30/2023: The right lateral ankle wound is down to just a pinhole under a layer of eschar. The left posterior calf wound is quite a bit improved since the last time I saw it. There is still a layer of slough on the surface but there has been more epithelialization. 02/07/2023: The right lateral ankle wound still has some eschar on the surface. The left posterior calf wound is about the same size, but with more areas of epithelialization. 02/20/2023: The right lateral ankle wound is healed. The  left posterior calf wound is smaller and is essentially flush with the surrounding skin, but the surface is quite dry. Her leg wrap slipped and her calf is quite swollen above the level of the wound. 03/01/2023: The right lateral ankle wound reopened and she also suffered a fall that resulted in a small wound on her right anterior tibial surface. The left posterior calf wound has a much better moisture balance today. There is a layer of slough on the surface. The [employer] worked much better for her as far as compression this week. 03/06/2023: The right lateral ankle wound has a layer of eschar on the surface and is too painful for me to debride. The anterior tibial surface wound is more or less healed; it is a very superficial abrasion at this point. The left posterior calf wound has a layer of slough on the surface. Her edema control is better. [name], KNARR ([id number]) [id number] Page 8 of 12 03/13/2023: The right lateral ankle wound has a layer of eschar on the surface. Once this was debrided, it was found to be healed. The anterior tibial surface wound has a layer of stuck silver alginate on the surface. This was removed to reveal a very superficial abrasion but not much change from last week. The left posterior calf wound is smaller and has its usual layer of accumulated slough. 03/25/2023: The anterior tibial surface wound has eschar and  silver alginate stuck to it. Once this was removed, the wound was found to be healed. The left posterior calf wound is roughly the same size and has a little bit thicker layer of accumulated slough. 04/03/2023: She has had more drainage from the posterior calf wound over the last week and the layer of slough is thicker. She also says it is more tender. 04/10/2023: Continued increase in drainage from her wound. The wound remains tender with a layer of slough on the surface. It does not appear as viable this week. She also has erythema,  swelling, and warmth in her entire left lower leg extending into the midfoot. 04/17/2023: The wound is about the same size this week. There is fibrotic adherent slough, but the drainage is not as profuse. She is currently taking levofloxacin. She is complaining of an increase in the burning sensation at her wound. 04/24/2023: The wound measured smaller this week and is much cleaner-looking. She completed her course of levofloxacin. Her PCP increased her dose of Lyrica and she reports that the burning has stopped. 05/01/2023: The wound is smaller again today and quite clean with only minimal slough on the surface. 05/08/2023: The wound is smaller again today. It is fairly clean, but the surface is a bit fibrotic. Moisture balance is better. Her leg is more swollen and erythematous today, however. 05/15/2023: The wound is about the same size today. The surface has very little slough accumulation, but remains fibrotic. Moisture balance is good. She is responding nicely to the oral Keflex she is taking and her leg is less erythematous and edematous. 05/22/2023: The wound is unchanged in size. There is quite a bit of slough on the surface but the moisture balance is better. It is still fibrotic, but a little bit less so. Her leg is no longer erythematous, but she does have some persistent edema. 05/29/2023: The wound measured a little bit smaller today. It is dry again and the surface is quite fibrotic. The edema in her leg has improved. 06/12/2023: The wound measures slightly larger today. The moisture balance has improved markedly. There is soft slough on the wound surface. Edema control is improved. 06/19/2023: The wound measured slightly smaller today. There is a band of epithelium dividing the most proximal portion of the wound from the remainder. There is slough accumulation and moisture balance is improved. 06/26/2023: The band of epithelium between the 2 open areas has gotten wider. There is some slough  accumulation on the surfaces along with a little bit of eschar. Moisture balance continues to be good. 07/03/2023: The band of epithelium continues to expand. The moisture balance remains good and the surface, particularly of the more distal aspect of the wound continues to improve. Slough and eschar accumulation, as usual. 07/10/2023: The wound measured smaller today. Moisture balance is good and the tissue underneath the layer of slough that has accumulated continues to improve. Patient History Information obtained from Patient. Family History Cancer - Mother,Paternal Grandparents, Diabetes - Siblings, Heart Disease - Father, Hypertension - Father. Social History Former smoker - quit in 1967. Medical History Eyes Patient has history of Cataracts - L eye Ear/Nose/Mouth/Throat Denies history of Chronic sinus problems/congestion, Middle ear problems Hematologic/Lymphatic Denies history of Anemia, Human Immunodeficiency Virus, Lymphedema Respiratory Patient has history of Asthma Cardiovascular Patient has history of Hypertension Endocrine Denies history of Type I Diabetes, Type II Diabetes Neurologic Patient has history of Neuropathy - Feet and finger Denies history of Seizure Disorder Oncologic Patient has history of Received Chemotherapy -  2012, Received Radiation - 2012 Hospitalization/Surgery History - Lower extremity venography 2020. - Intravascular ultrasound. - peripheral vascular intervention. - melanoma 2011. 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 Vitals Time Taken: 3:28 PM, Height: 63 in, Weight: 200 lbs, BMI: 35.4, Temperature: 98.4  F, Pulse: 69 bpm, Respiratory Rate: 18 breaths/min, Blood Pressure: 137/88 mmHg. Respiratory Normal work of breathing on room air. General Notes: 07/10/2023: The wound measured smaller today. Moisture balance is good and the tissue underneath the layer of slough that has accumulated continues to improve. Integumentary (Hair, Skin) Wound #2 status is Open. Original cause of wound was Blister. The date acquired was: 07/06/2022. The wound has been in treatment 49 weeks. The wound is located on the Left,Posterior Lower Leg. The wound measures 3.5cm length x 1.5cm width x 0.1cm depth; 4.123cm^2 area and 0.412cm^3 volume. There is Fat Layer (Subcutaneous Tissue) exposed. There is no tunneling or undermining noted. There is a medium amount of serosanguineous drainage noted. The wound margin is distinct with the outline attached to the wound base. There is small (1-33%) red granulation within the wound bed. There is a large (67-100%) amount of necrotic tissue within the wound bed including Eschar and Adherent Slough. The periwound skin appearance had no abnormalities noted for texture. The periwound skin appearance had no abnormalities noted for moisture. The periwound skin appearance exhibited: Rubor. Periwound temperature was noted as No Abnormality. The periwound has tenderness on palpation. Assessment Active Problems ICD-10 Non-pressure chronic ulcer of other part of left lower leg with fat layer exposed Varicose veins of bilateral lower extremities with other complications Venous insufficiency (chronic) (peripheral) Polyneuropathy, unspecified Localized edema Procedures Wound #2 Pre-procedure diagnosis of Wound #2 is a Venous Leg Ulcer located on the Left,Posterior Lower Leg .Severity of Tissue Pre Debridement is: Fat layer exposed. There was a Selective/Open Wound Non-Viable Tissue Debridement with a total area of 4.12 sq cm performed by Duanne Guess, MD. With the following instrument(s):  Curette to remove Non-Viable tissue/material. Material removed includes [facility] after achieving pain control using Lidocaine 4% Topical Solution. No specimens were taken. A time out was conducted at 15:45, prior to the start of the procedure. A Minimum amount of bleeding was controlled with Pressure. The procedure was tolerated well. Post Debridement Measurements: 3.5cm length x 1.5cm width x 0.1cm depth; 0.412cm^3 volume. Character of Wound/Ulcer Post Debridement is improved. Severity of Tissue Post Debridement is: Fat layer exposed. Post procedure Diagnosis Wound #2: Same as Pre-Procedure General Notes: Scribed for Dr. Lady Gary by Brenton Grills, RN. Pre-procedure diagnosis of Wound #2 is a Venous Leg Ulcer located on the Left,Posterior Lower Leg . There was a Three Layer Compression Therapy Procedure by Brenton Grills, RN. Post procedure Diagnosis Wound #2: Same as Pre-Procedure Plan Follow-up Appointments: Return Appointment in 1 week. - Dr. Lady Gary Room 3 Anesthetic: Wound #2 Left,Posterior Lower Leg: (In clinic) Topical Lidocaine 4% applied to wound bed Bathing/ Shower/ Hygiene: May shower with protection but do not get wound dressing(s) wet. Protect dressing(s) with water repellant cover (for example, large plastic bag) or a cast cover and may then take shower. Edema Control - Lymphedema / SCD / Other: Lymphedema Pumps. Use Lymphedema pumps on leg(s) 2-3 times a day for 45-60 minutes. If wearing any wraps or hose, do not remove  them. Continue exercising as instructed. - Use x1 per day Avoid standing for long periods of time. Exercise regularly GERTRUE, SCHOONMAKER ([id number]) [id number] Page 10 of 12 Off-Loading: Wound #2 Left,Posterior Lower Leg: Other: - Elevate legs at heart level or above heart level while sitting. WOUND #2: - Lower Leg Wound Laterality: Left, Posterior Cleanser: Soap and Water 1 x Per Week/30 Days Discharge Instructions: May shower and wash  wound with dial antibacterial soap and water prior to dressing change. Peri-Wound Care: Ketoconazole Cream 2% 1 x Per Week/30 Days Discharge Instructions: Apply Ketoconazole as directed Peri-Wound Care: Triamcinolone 15 (g) 1 x Per Week/30 Days Discharge Instructions: Use triamcinolone 15 (g) as directed Peri-Wound Care: Zinc Oxide Ointment 30g tube 1 x Per Week/30 Days Discharge Instructions: Apply Zinc Oxide to periwound with each dressing change Peri-Wound Care: Sween Lotion (Moisturizing lotion) 1 x Per Week/30 Days Discharge Instructions: Apply moisturizing lotion as directed Topical: Gentamicin 1 x Per Week/30 Days Discharge Instructions: As directed by physician Topical: Mupirocin Ointment 1 x Per Week/30 Days Discharge Instructions: Apply Mupirocin (Bactroban) as instructed Topical: Skintegrity Hydrogel 4 (oz) 1 x Per Week/30 Days Discharge Instructions: Apply hydrogel as directed Prim Dressing: Endoform 2x2 in 1 x Per Week/30 Days ary Discharge Instructions: Moisten with saline Secondary Dressing: Optifoam Non-Adhesive Dressing, 4x4 in 1 x Per Week/30 Days Discharge Instructions: Apply over primary dressing as directed. Secondary Dressing: Zetuvit Plus 4x8 in 1 x Per Week/30 Days Discharge Instructions: Apply over primary dressing as directed. Secured With: 99M Medipore Scientist, research (life sciences) Surgical T 2x10 (in/yd) 1 x Per Week/30 Days ape Discharge Instructions: Secure with tape as directed. Com pression Wrap: CoFlex Calamine Unna Boot 4 x 6 (in/yd) 1 x Per Week/30 Days Discharge Instructions: Apply Coflex Calamine [employer] as directed. 07/10/2023: The wound measured smaller today. Moisture balance is good and the tissue underneath the layer of slough that has accumulated continues to improve. I used a curette to debride the slough from her wound. Will continue mixture of topical gentamicin and mupirocin with endoform and Optifoam covering to maintain moisture. Continue calamine based Conservator, museum/gallery. Follow-up in 1 week. Electronic Signature(s) Signed: 07/10/2023 4:41:55 PM By: Duanne Guess MD FACS Entered By: Duanne Guess on 07/10/2023 13:41:54 -------------------------------------------------------------------------------- HxROS Details Patient Name: Date of Service: [name], [name] 07/10/2023 3:15 PM Medical Record Number: [id number] Patient Account Number: [id number] Date of Birth/Sex: Treating RN: 14-May-1946 (77 y.o. F) Primary Care Provider: Eustaquio Boyden Other Clinician: Referring Provider: Treating Provider/Extender: Priscille Loveless in Treatment: 49 Information Obtained From Patient Eyes Medical History: Positive for: Cataracts - L eye Ear/Nose/Mouth/Throat Medical History: Negative for: Chronic sinus problems/congestion; Middle ear problems Hematologic/Lymphatic Medical History: Negative for: Anemia; Human Immunodeficiency Virus; Lymphedema Respiratory ZALI, RODA ([id number]) [id number] Page 11 of 12 Medical History: Positive for: Asthma Past Medical History Notes: OSA on CPAP Cardiovascular Medical History: Positive for: Hypertension Past Medical History Notes: Cronic venous insufficiency 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 Gastrointestinal Medical History: Past Medical History Notes: liver cyst Endocrine Medical History: Negative for: Type I Diabetes; Type II Diabetes Musculoskeletal Medical History: Past Medical History Notes: arthritis Neurologic Medical History: Positive for: Neuropathy - Feet and finger Negative for: Seizure Disorder Past Medical History Notes: restless leg syndrome Oncologic Medical History: Positive for: Received Chemotherapy - 2012; Received Radiation - 2012 HBO Extended History Items Eyes: Cataracts Immunizations Pneumococcal Vaccine: Received Pneumococcal Vaccination: Yes Received Pneumococcal Vaccination On or After  60th Birthday: Yes Implantable Devices No devices  added Hospitalization / Surgery History Type of Hospitalization/Surgery Lower extremity venography 2020 Intravascular ultrasound peripheral vascular intervention melanoma 2011 Family and Social History Cancer: Yes - Mother,Paternal Grandparents; Diabetes: Yes - Siblings; Heart Disease: Yes - Father; Hypertension: Yes - Father; Former smoker - quit in [employer]) Signed: 07/10/2023 5:03:18 PM By: Duanne Guess MD FACS Entered By: Duanne Guess on 07/10/2023 13:38:42 [name], [name] ([id number]) [id number] Page 12 of 12 -------------------------------------------------------------------------------- SuperBill Details Patient Name: Date of Service: [name], [name] 07/10/2023 Medical Record Number: [id number] Patient Account Number: [id number] Date of Birth/Sex: Treating RN: [birth year] (77 y.o. F) Primary Care Provider: Eustaquio Boyden Other Clinician: Referring Provider: Treating Provider/Extender: Priscille Loveless in Treatment: 49 Diagnosis Coding ICD-10 Codes Code Description [phone] Non-pressure chronic ulcer of other part of left lower leg with fat layer exposed I83.893 Varicose veins of bilateral lower extremities with other complications I87.2 Venous insufficiency (chronic) (peripheral) G62.9 Polyneuropathy, unspecified R60.0 Localized edema Facility Procedures : CPT4 Code: [id number] Description: 97597 - DEBRIDE WOUND 1ST 20 SQ CM OR < ICD-10 Diagnosis Description L97.822 Non-pressure chronic ulcer of other part of left lower leg with fat layer expose Modifier: d Quantity: 1 Physician Procedures : CPT4 Code Description Modifier [id number] - WC PHYS LEVEL 4 - EST PT 25 ICD-10 Diagnosis Description L97.822 Non-pressure chronic ulcer of other part of left lower leg with fat layer exposed I83.893 Varicose veins of bilateral lower extremities with  other  complications I87.2 Venous insufficiency (chronic) (peripheral) R60.0 Localized edema Quantity: 1 : [id number] 97597 - WC PHYS DEBR WO ANESTH 20 SQ CM ICD-10 Diagnosis Description L97.822 Non-pressure chronic ulcer of other part of left lower leg with fat layer exposed Quantity: 1 Electronic Signature(s) Signed: 07/10/2023 4:42:20 PM By: Duanne Guess MD FACS Entered By: Duanne Guess on 07/10/2023 13:42:20

## 2023-07-17 ENCOUNTER — Encounter (HOSPITAL_BASED_OUTPATIENT_CLINIC_OR_DEPARTMENT_OTHER): Payer: Medicare Other | Admitting: General Surgery

## 2023-07-17 DIAGNOSIS — R600 Localized edema: Secondary | ICD-10-CM | POA: Diagnosis not present

## 2023-07-17 DIAGNOSIS — L97822 Non-pressure chronic ulcer of other part of left lower leg with fat layer exposed: Secondary | ICD-10-CM | POA: Diagnosis not present

## 2023-07-17 DIAGNOSIS — I871 Compression of vein: Secondary | ICD-10-CM | POA: Diagnosis not present

## 2023-07-17 DIAGNOSIS — I10 Essential (primary) hypertension: Secondary | ICD-10-CM | POA: Diagnosis not present

## 2023-07-17 DIAGNOSIS — L97222 Non-pressure chronic ulcer of left calf with fat layer exposed: Secondary | ICD-10-CM | POA: Diagnosis not present

## 2023-07-17 DIAGNOSIS — I872 Venous insufficiency (chronic) (peripheral): Secondary | ICD-10-CM | POA: Diagnosis not present

## 2023-07-17 DIAGNOSIS — G629 Polyneuropathy, unspecified: Secondary | ICD-10-CM | POA: Diagnosis not present

## 2023-07-17 DIAGNOSIS — I83893 Varicose veins of bilateral lower extremities with other complications: Secondary | ICD-10-CM | POA: Diagnosis not present

## 2023-07-18 ENCOUNTER — Encounter (HOSPITAL_BASED_OUTPATIENT_CLINIC_OR_DEPARTMENT_OTHER): Payer: Medicare Other | Admitting: General Surgery

## 2023-07-18 DIAGNOSIS — L97822 Non-pressure chronic ulcer of other part of left lower leg with fat layer exposed: Secondary | ICD-10-CM | POA: Diagnosis not present

## 2023-07-18 DIAGNOSIS — R600 Localized edema: Secondary | ICD-10-CM | POA: Diagnosis not present

## 2023-07-18 DIAGNOSIS — G629 Polyneuropathy, unspecified: Secondary | ICD-10-CM | POA: Diagnosis not present

## 2023-07-18 DIAGNOSIS — I871 Compression of vein: Secondary | ICD-10-CM | POA: Diagnosis not present

## 2023-07-18 DIAGNOSIS — I10 Essential (primary) hypertension: Secondary | ICD-10-CM | POA: Diagnosis not present

## 2023-07-18 DIAGNOSIS — I83893 Varicose veins of bilateral lower extremities with other complications: Secondary | ICD-10-CM | POA: Diagnosis not present

## 2023-07-22 NOTE — Progress Notes (Signed)
50 History: Chemotherapy, Received Radiation Clustered Wound: No Wound Measurements Length: (cm) 1.7 Width: (cm) 1.5 Depth: (cm) 0.1 Area: (cm) 2.003 Volume: (cm) 0.2 % Reduction in Area: 70.9% % Reduction in Volume: 70.9% Epithelialization: Small (1-33%) Tunneling: No Undermining: No Wound Description Classification: Full Thickness Without Exposed Support Structures Wound Margin: Distinct, outline attached Exudate Amount: Medium Exudate Type: Serosanguineous Exudate Color: red, brown Foul Odor After Cleansing: No Slough/Fibrino Yes Wound Bed Granulation Amount: Small (1-33%) Exposed Structure Granulation Quality: Red Fascia Exposed: No Necrotic Amount: Large (67-100%) Fat Layer (Subcutaneous Tissue) Exposed: Yes Necrotic Quality: Eschar, Adherent Slough Tendon Exposed: No Muscle Exposed: No Joint Exposed: No Bone Exposed: No Periwound Skin  Texture Texture Color No Abnormalities Noted: Yes No Abnormalities Noted: No Rubor: Yes Moisture No Abnormalities Noted: Yes Temperature / Pain Temperature: No Abnormality Tenderness on Palpation: Yes Treatment Notes Wound #2 (Lower Leg) Wound Laterality: Left, Posterior Cleanser Soap and Water Discharge Instruction: May shower and wash wound with dial antibacterial soap and water prior to dressing change. Peri-Wound Care Ketoconazole Cream 2% Discharge Instruction: Apply Ketoconazole as directed Triamcinolone 15 (g) Discharge Instruction: Use triamcinolone 15 (g) as directed Zinc Oxide Ointment 30g tube Discharge Instruction: Apply Zinc Oxide to periwound with each dressing change Sween Lotion (Moisturizing lotion) Discharge Instruction: Apply moisturizing lotion as directed Topical Gentamicin Discharge Instruction: As directed by physician Mupirocin Ointment Discharge Instruction: Apply Mupirocin (Bactroban) as instructed Skintegrity Hydrogel 4 (oz) Discharge Instruction: Apply hydrogel as directed Primary Dressing Endoform 2x2 in Discharge Instruction: Moisten with saline Secondary Dressing Optifoam Non-Adhesive Dressing, 4x4 in Discharge Instruction: Apply over primary dressing as directed. [name], [name] ([id number]) [id number] Page 4 of 4 Zetuvit Plus 4x8 in Discharge Instruction: Apply over primary dressing as directed. Secured With Compression Wrap CoFlex Calamine Unna Boot 4 x 6 (in/yd) Discharge Instruction: Apply Coflex [employer]amine [employer] as directed. Compression Stockings Add-Ons Notes Wrap to Wound 2 on left leg still intact, so did not change it. Electronic Signature(s) Signed: 07/22/2023 12:35:20 PM By: [name] Entered By: [name] on 07/18/2023 08:22:04 -------------------------------------------------------------------------------- Vitals Details Patient Name: Date of Service: [name], [name] 07/18/2023 10:45  A M Medical Record Number: [id number] Patient Account Number: [id number] Date of Birth/Sex: Treating RN: 02/02/1946 (77 y.o. [name] Primary Care [name]: [name] Other Clinician: Referring [name]: Treating Cobin [name]/Extender: [name] in Treatment: 50 Vital Signs Time Taken: 11:00 Temperature (F): 97.7 Height (in): 63 Pulse (bpm): 70 Weight (lbs): 200 Respiratory Rate (breaths/min): 18 Body Mass Index (BMI): 35.4 Blood Pressure (mmHg): 159/85 Reference Range: 80 - 120 mg / dl Electronic Signature(s) Signed: 07/22/2023 12:35:20 PM By: [name] Entered By: [name] on 07/18/2023 46:96:29  [name], [name] ([id number]) [id number] Page 1 of 4 Visit Report for 07/18/2023 Arrival Information Details Patient Name: Date of Service: [name], [name] 07/18/2023 10:45 A M Medical Record Number: [id number] Patient Account Number: [id number] Date of Birth/Sex: Treating RN: May 10, 1946 (77 y.o. [name] Primary Care [name]: [name] Other Clinician: Referring [name]: Treating [name]/Extender: [name] in Treatment: 50 Visit Information History Since Last Visit All ordered tests and consults were completed: Yes Patient Arrived: Ambulatory Added or deleted any medications: No Arrival Time: 11:20 Any new allergies or adverse reactions: No Accompanied By: self Had a fall or experienced change in No Transfer Assistance: None activities of daily living that may affect Patient Identification Verified: Yes risk of falls: Secondary Verification Process Completed: Yes Signs or symptoms of abuse/neglect since last visito No Patient Requires Transmission-Based Precautions: No Hospitalized since last visit: No Patient Has Alerts: No Implantable device outside of the clinic excluding No cellular tissue based products placed in the center since last visit: Has Dressing in Place as Prescribed: Yes Pain Present Now: No Electronic Signature(s) Signed: 07/22/2023 12:35:20 PM By: [name] Entered By: [name] on 07/18/2023 08:20:59 -------------------------------------------------------------------------------- Compression Therapy Details Patient Name: Date of Service: [name], [name] 07/18/2023 10:45 A M Medical Record Number: [id number] Patient Account Number: [id number] Date of Birth/Sex: Treating RN: 19-Sep-1946 (77 y.o. [name] Primary Care [name]: [name] Other Clinician: Referring [name]: Treating [name]/Extender: [name] in Treatment:  50 Compression Therapy Performed for Wound Assessment: Non-Wound Location Performed By: Clinician Amber Grills, RN Compression Type: Three Layer Location: Lower Extremity, Right Notes Wrapped with Calamine Coflex and ABD underneath for absorption per Dr. Lady [name]. Electronic Signature(s) Signed: 07/22/2023 12:35:20 PM By: [name] Entered By: [name] on 07/18/2023 08:24:41 [name] ([id number]) [id number] Page 2 of 4 -------------------------------------------------------------------------------- Encounter Discharge Information Details Patient Name: Date of Service: [name], [name] 07/18/2023 10:45 A M Medical Record Number: [id number] Patient Account Number: [id number] Date of Birth/Sex: Treating RN: Feb 20, 1946 (76 y.o. [name] Primary Care Anabela Crayton: [name] Other Clinician: Referring Mohini Heathcock: Treating Lakin Romer/Extender: [name] in Treatment: 50 Encounter Discharge Information Items Discharge Condition: Stable Ambulatory Status: Ambulatory Discharge Destination: Home Transportation: Private Auto Accompanied By: self Schedule Follow-up Appointment: Yes Clinical Summary of Care: Patient Declined Electronic Signature(s) Signed: 07/22/2023 12:35:20 PM By: [name] Entered By: [name] on 07/18/2023 08:27:45 -------------------------------------------------------------------------------- Patient/Caregiver Education Details Patient Name: Date of Service: [name] 9/12/2024andnbsp10:45 A M Medical Record Number: [id number] Patient Account Number: [id number] Date of Birth/Gender: Treating RN: 1946/08/30 (77 y.o. [name] Primary Care Physician: [name] Other Clinician: Referring Physician: Treating Physician/Extender: [name] in Treatment: 50 Education Assessment Education Provided To: Patient Education Topics  Provided Wound/Skin Impairment: Methods: Explain/Verbal Responses: State content correctly Electronic Signature(s) Signed: 07/22/2023 12:35:20 PM By: [name] Entered By: [name] on 07/18/2023 08:27:16 -------------------------------------------------------------------------------- Wound Assessment Details Patient Name: Date of Service: [name], [name] 07/18/2023 10:45 A M Medical Record Number: [id number] Patient Account Number: [id number] Date of Birth/Sex: Treating RN: 1946/04/24 (77 y.o. [name] Primary Care Quintyn Dombek: [name] Other Clinician: Referring Mukesh Kornegay: Treating Machell Wirthlin/Extender: Katharina Caper Weeks in Treatment: [address] [name], [name] ([id number]) [id number] Page 3 of 4 Wound Number: 2 Primary Venous Leg Ulcer Etiology: Wound Location: Left, Posterior Lower Leg Wound Open Wounding Event: Blister Status: Date Acquired: 07/06/2022 Comorbid Cataracts, Asthma, Hypertension, Neuropathy, Received Weeks Of Treatment:

## 2023-07-22 NOTE — Progress Notes (Signed)
BRIXLEY, DIOGUARDI ([id number]) [id number] Page 1 of 1 Visit Report for 07/18/2023 SuperBill Details Patient Name: Date of Service: ELVINA, RAN 07/18/2023 Medical Record Number: [id number] Patient Account Number: [id number] Date of Birth/Sex: Treating RN: [birth year] (77 y.o. [name] Primary Care Provider: Eustaquio Boyden Other Clinician: Referring Provider: Treating Provider/Extender: Priscille Loveless in Treatment: 50 Diagnosis Coding ICD-10 Codes Code Description [phone] Non-pressure chronic ulcer of other part of left lower leg with fat layer exposed I83.893 Varicose veins of bilateral lower extremities with other complications I87.2 Venous insufficiency (chronic) (peripheral) G62.9 Polyneuropathy, unspecified R60.0 Localized edema Facility Procedures CPT4 Code Description Modifier Quantity [phone] (Facility Use Only) [phone] - APPLY MULTLAY COMPRS LWR RT LEG 1 ICD-10 Diagnosis Description I87.2 Venous insufficiency (chronic) (peripheral) Electronic Signature(s) Signed: 07/18/2023 12:00:58 PM By: Duanne Guess MD FACS Signed: 07/22/2023 12:35:20 PM By: Brenton Grills Entered By: Brenton Grills on 07/18/2023 41:66:06

## 2023-07-22 NOTE — Progress Notes (Signed)
of Treatment: Open N/A N/A Wound Status: No N/A N/A Wound Recurrence: 1.7x1.5x0.1 N/A N/A Measurements L x W x D (cm) 2.003 N/A N/A A (cm) : rea 0.2 N/A N/A Volume (cm) : 70.90% N/A N/A % Reduction in A rea: 70.90% N/A N/A % Reduction in Volume: Full Thickness Without Exposed N/A N/A Classification: Support Structures Medium N/A N/A Exudate A mount: Serosanguineous N/A N/A Exudate Type: red, brown N/A N/A Exudate Color: Distinct, outline attached N/A N/A Wound Margin: Small (1-33%) N/A N/A Granulation A mount: Red N/A N/A Granulation Quality: Large (67-100%) N/A N/A Necrotic A mount: Eschar, Adherent Slough N/A N/A Necrotic Tissue: Fat Layer (Subcutaneous Tissue): Yes N/A N/A Exposed Structures: Fascia: No Tendon: No Muscle: No Joint: No Bone: No Small (1-33%) N/A N/A Epithelialization: Debridement - Selective/Open Wound N/A N/A Debridement: Pre-procedure Verification/Time Out 15:08 N/A N/A Taken: Lidocaine 4% Topical Solution N/A N/A Pain Control: Slough N/A N/A Tissue  Debrided: Non-Viable Tissue N/A N/A Level: 2 N/A N/A Debridement A (sq cm): rea Curette N/A N/A Instrument: Minimum N/A N/A Bleeding: Pressure N/A N/A Hemostasis A chieved: Procedure was tolerated well N/A N/A Debridement Treatment Response: 1.7x1.5x0.1 N/A N/A Post Debridement Measurements L x W x D (cm) 0.2 N/A N/A Post Debridement Volume: (cm) Scarring: Yes N/A N/A Periwound Skin Texture: Maceration: No N/A N/A Periwound Skin Moisture: Dry/Scaly: No Rubor: Yes N/A N/A Periwound Skin Color: No Abnormality N/A N/A Temperature: Yes N/A N/A Tenderness on Palpation: Debridement N/A N/A Procedures Performed: Treatment Notes Wound #2 (Lower Leg) Wound Laterality: Left, Posterior Cleanser Soap and Water Discharge Instruction: May shower and wash wound with dial antibacterial soap and water prior to dressing change. Peri-Wound Care Ketoconazole Cream 2% Discharge Instruction: Apply Ketoconazole as directed Triamcinolone 15 (g) Discharge Instruction: Use triamcinolone 15 (g) as directed [name], [name] ([id number]) [id number] Page 4 of 8 Zinc Oxide Ointment 30g tube Discharge Instruction: Apply Zinc Oxide to periwound with each dressing change Sween Lotion (Moisturizing lotion) Discharge Instruction: Apply moisturizing lotion as directed Topical Gentamicin Discharge Instruction: As directed by physician Mupirocin Ointment Discharge Instruction: Apply Mupirocin (Bactroban) as instructed Skintegrity Hydrogel 4 (oz) Discharge Instruction: Apply hydrogel as directed Primary Dressing Endoform 2x2 in Discharge Instruction: Moisten with saline Secondary Dressing Optifoam Non-Adhesive Dressing, 4x4 in Discharge Instruction: Apply over primary dressing as directed. Zetuvit Plus 4x8 in Discharge Instruction: Apply over primary dressing as directed. Secured With Compression Wrap CoFlex Calamine Unna Boot 4 x 6 (in/yd) Discharge Instruction:  Apply Coflex Calamine [employer] as directed. Compression Stockings Add-Ons Electronic Signature(s) Signed: 07/17/2023 4:07:38 PM By: Duanne Guess MD FACS Entered By: Duanne Guess on 07/17/2023 13:07:38 -------------------------------------------------------------------------------- Multi-Disciplinary Care Plan Details Patient Name: Date of Service: [name], [name] 07/17/2023 2:30 PM Medical Record Number: [id number] Patient Account Number: [id number] Date of Birth/Sex: Treating RN: [birth year] (77 y.o. [name] Primary Care Cristopher Ciccarelli: Eustaquio Boyden Other Clinician: Referring Shelsea Hangartner: Treating Edem Tiegs/Extender: Priscille Loveless in Treatment: 50 Multidisciplinary Care Plan reviewed with physician Active Inactive Venous Leg Ulcer Nursing Diagnoses: Knowledge deficit related to disease process and management Potential for venous Insuffiency (use before diagnosis confirmed) Goals: Patient will maintain optimal edema control Date Initiated: 02/07/2023 Target Resolution Date: 08/02/2023 Goal Status: Active Interventions: [name], [name] ([id number]) [id number] Page 5 of 8 Assess peripheral edema status every visit. Compression as ordered Treatment Activities: Therapeutic compression applied : 02/07/2023 Notes: Wound/Skin Impairment Nursing Diagnoses: Impaired tissue integrity Goals: Ulcer/skin breakdown will have a volume reduction of 30% by week 4 Date Initiated:  of Treatment: Open N/A N/A Wound Status: No N/A N/A Wound Recurrence: 1.7x1.5x0.1 N/A N/A Measurements L x W x D (cm) 2.003 N/A N/A A (cm) : rea 0.2 N/A N/A Volume (cm) : 70.90% N/A N/A % Reduction in A rea: 70.90% N/A N/A % Reduction in Volume: Full Thickness Without Exposed N/A N/A Classification: Support Structures Medium N/A N/A Exudate A mount: Serosanguineous N/A N/A Exudate Type: red, brown N/A N/A Exudate Color: Distinct, outline attached N/A N/A Wound Margin: Small (1-33%) N/A N/A Granulation A mount: Red N/A N/A Granulation Quality: Large (67-100%) N/A N/A Necrotic A mount: Eschar, Adherent Slough N/A N/A Necrotic Tissue: Fat Layer (Subcutaneous Tissue): Yes N/A N/A Exposed Structures: Fascia: No Tendon: No Muscle: No Joint: No Bone: No Small (1-33%) N/A N/A Epithelialization: Debridement - Selective/Open Wound N/A N/A Debridement: Pre-procedure Verification/Time Out 15:08 N/A N/A Taken: Lidocaine 4% Topical Solution N/A N/A Pain Control: Slough N/A N/A Tissue  Debrided: Non-Viable Tissue N/A N/A Level: 2 N/A N/A Debridement A (sq cm): rea Curette N/A N/A Instrument: Minimum N/A N/A Bleeding: Pressure N/A N/A Hemostasis A chieved: Procedure was tolerated well N/A N/A Debridement Treatment Response: 1.7x1.5x0.1 N/A N/A Post Debridement Measurements L x W x D (cm) 0.2 N/A N/A Post Debridement Volume: (cm) Scarring: Yes N/A N/A Periwound Skin Texture: Maceration: No N/A N/A Periwound Skin Moisture: Dry/Scaly: No Rubor: Yes N/A N/A Periwound Skin Color: No Abnormality N/A N/A Temperature: Yes N/A N/A Tenderness on Palpation: Debridement N/A N/A Procedures Performed: Treatment Notes Wound #2 (Lower Leg) Wound Laterality: Left, Posterior Cleanser Soap and Water Discharge Instruction: May shower and wash wound with dial antibacterial soap and water prior to dressing change. Peri-Wound Care Ketoconazole Cream 2% Discharge Instruction: Apply Ketoconazole as directed Triamcinolone 15 (g) Discharge Instruction: Use triamcinolone 15 (g) as directed [name], [name] ([id number]) [id number] Page 4 of 8 Zinc Oxide Ointment 30g tube Discharge Instruction: Apply Zinc Oxide to periwound with each dressing change Sween Lotion (Moisturizing lotion) Discharge Instruction: Apply moisturizing lotion as directed Topical Gentamicin Discharge Instruction: As directed by physician Mupirocin Ointment Discharge Instruction: Apply Mupirocin (Bactroban) as instructed Skintegrity Hydrogel 4 (oz) Discharge Instruction: Apply hydrogel as directed Primary Dressing Endoform 2x2 in Discharge Instruction: Moisten with saline Secondary Dressing Optifoam Non-Adhesive Dressing, 4x4 in Discharge Instruction: Apply over primary dressing as directed. Zetuvit Plus 4x8 in Discharge Instruction: Apply over primary dressing as directed. Secured With Compression Wrap CoFlex Calamine Unna Boot 4 x 6 (in/yd) Discharge Instruction:  Apply Coflex Calamine [employer] as directed. Compression Stockings Add-Ons Electronic Signature(s) Signed: 07/17/2023 4:07:38 PM By: Duanne Guess MD FACS Entered By: Duanne Guess on 07/17/2023 13:07:38 -------------------------------------------------------------------------------- Multi-Disciplinary Care Plan Details Patient Name: Date of Service: [name], [name] 07/17/2023 2:30 PM Medical Record Number: [id number] Patient Account Number: [id number] Date of Birth/Sex: Treating RN: [birth year] (77 y.o. [name] Primary Care Cristopher Ciccarelli: Eustaquio Boyden Other Clinician: Referring Shelsea Hangartner: Treating Edem Tiegs/Extender: Priscille Loveless in Treatment: 50 Multidisciplinary Care Plan reviewed with physician Active Inactive Venous Leg Ulcer Nursing Diagnoses: Knowledge deficit related to disease process and management Potential for venous Insuffiency (use before diagnosis confirmed) Goals: Patient will maintain optimal edema control Date Initiated: 02/07/2023 Target Resolution Date: 08/02/2023 Goal Status: Active Interventions: [name], [name] ([id number]) [id number] Page 5 of 8 Assess peripheral edema status every visit. Compression as ordered Treatment Activities: Therapeutic compression applied : 02/07/2023 Notes: Wound/Skin Impairment Nursing Diagnoses: Impaired tissue integrity Goals: Ulcer/skin breakdown will have a volume reduction of 30% by week 4 Date Initiated:  08/03/2022 Date Inactivated: 09/10/2022 Target Resolution Date: 08/31/2022 Unmet Reason: uncontrolled edema, Goal Status: Unmet acute infection Ulcer/skin breakdown will have a volume reduction of 50% by week 8 Date Initiated: 09/10/2022 Target Resolution Date: 08/02/2023 Goal Status: Active Interventions: Assess ulceration(s) every visit Treatment Activities: Skin care regimen initiated : 08/03/2022 Notes: [place] ordered. 10/30 RX Levo started.  09/10/22 Electronic Signature(s) Signed: 07/22/2023 12:35:20 PM By: Brenton Grills Entered By: Brenton Grills on 07/17/2023 12:01:40 -------------------------------------------------------------------------------- Pain Assessment Details Patient Name: Date of Service: [name], [name] 07/17/2023 2:30 PM Medical Record Number: [id number] Patient Account Number: [id number] Date of Birth/Sex: Treating RN: 1946-07-26 (77 y.o. F) Primary Care Jacci Ruberg: Eustaquio Boyden Other Clinician: Referring Jessicah Croll: Treating Breona Cherubin/Extender: Priscille Loveless in Treatment: 50 Active Problems Location of Pain Severity and Description of Pain Patient Has Paino No Site Locations Pain Management and Medication Current Pain Management: MARLENY, LARO ([id number]) [id number] Page 6 of 8 Electronic Signature(s) Signed: 07/17/2023 4:44:36 PM By: Dayton Scrape Entered By: Dayton Scrape on 07/17/2023 11:48:17 -------------------------------------------------------------------------------- Patient/Caregiver Education Details Patient Name: Date of Service: DEIRDRE, RIGGS 9/11/2024andnbsp2:30 PM Medical Record Number: [id number] Patient Account Number: [id number] Date of Birth/Gender: Treating RN: 1946-03-18 (77 y.o. [name] Primary Care Physician: Eustaquio Boyden Other Clinician: Referring Physician: Treating Physician/Extender: Priscille Loveless in Treatment: 50 Education Assessment Education Provided To: Patient Education Topics Provided Wound/Skin Impairment: Methods: Explain/Verbal Responses: State content correctly Electronic Signature(s) Signed: 07/22/2023 12:35:20 PM By: Brenton Grills Entered By: Brenton Grills on 07/17/2023 12:03:55 -------------------------------------------------------------------------------- Wound Assessment Details Patient Name: Date of Service: [name], [name] 07/17/2023 2:30 PM Medical  Record Number: [id number] Patient Account Number: [id number] Date of Birth/Sex: Treating RN: 04/20/1946 (77 y.o. [name] Primary Care Grove Defina: Eustaquio Boyden Other Clinician: Referring Mistey Hoffert: Treating [name] [name]/Extender: Katharina Caper Weeks in Treatment: 50 Wound Status Wound Number: 2 Primary Venous Leg Ulcer Etiology: Wound Location: Left, Posterior Lower Leg Wound Open Wounding Event: Blister Status: Date Acquired: 07/06/2022 Comorbid Cataracts, Asthma, Hypertension, Neuropathy, Received Weeks Of Treatment: 50 History: Chemotherapy, Received Radiation Clustered Wound: No Photos [name], [name] ([id number]) [id number] Page 7 of 8 Wound Measurements Length: (cm) 1.7 Width: (cm) 1.5 Depth: (cm) 0.1 Area: (cm) 2.003 Volume: (cm) 0.2 % Reduction in Area: 70.9% % Reduction in Volume: 70.9% Epithelialization: Small (1-33%) Tunneling: No Undermining: No Wound Description Classification: Full Thickness Without Exposed Support Structures Wound Margin: Distinct, outline attached Exudate Amount: Medium Exudate Type: Serosanguineous Exudate Color: red, brown Foul Odor After Cleansing: No Slough/Fibrino Yes Wound Bed Granulation Amount: Small (1-33%) Exposed Structure Granulation Quality: Red Fascia Exposed: No Necrotic Amount: Large (67-100%) Fat Layer (Subcutaneous Tissue) Exposed: Yes Necrotic Quality: Eschar, Adherent Slough Tendon Exposed: No Muscle Exposed: No Joint Exposed: No Bone Exposed: No Periwound Skin Texture Texture Color No Abnormalities Noted: Yes No Abnormalities Noted: No Rubor: Yes Moisture No Abnormalities Noted: Yes Temperature / Pain Temperature: No Abnormality Tenderness on Palpation: Yes Electronic Signature(s) Signed: 07/22/2023 12:35:20 PM By: Brenton Grills Entered By: Brenton Grills on 07/17/2023  11:58:49 -------------------------------------------------------------------------------- Vitals Details Patient Name: Date of Service: [name]. 07/17/2023 2:30 PM Medical Record Number: [id number] Patient Account Number: [id number] Date of Birth/Sex: Treating RN: 11-30-1945 (77 y.o. F) Primary Care Janiqua Friscia: Eustaquio Boyden Other Clinician: Referring Sherral Dirocco: Treating Khristopher Kapaun/Extender: Priscille Loveless in Treatment: 50 Vital Signs Time Taken: 02:47 Temperature (F): 97.7 Height (in): 63 Pulse (bpm): 70 Weight (lbs): 200 Respiratory Rate (breaths/min): 18 Body Mass Index (BMI): 35.4 Blood Pressure (mmHg): 159/85  of Treatment: Open N/A N/A Wound Status: No N/A N/A Wound Recurrence: 1.7x1.5x0.1 N/A N/A Measurements L x W x D (cm) 2.003 N/A N/A A (cm) : rea 0.2 N/A N/A Volume (cm) : 70.90% N/A N/A % Reduction in A rea: 70.90% N/A N/A % Reduction in Volume: Full Thickness Without Exposed N/A N/A Classification: Support Structures Medium N/A N/A Exudate A mount: Serosanguineous N/A N/A Exudate Type: red, brown N/A N/A Exudate Color: Distinct, outline attached N/A N/A Wound Margin: Small (1-33%) N/A N/A Granulation A mount: Red N/A N/A Granulation Quality: Large (67-100%) N/A N/A Necrotic A mount: Eschar, Adherent Slough N/A N/A Necrotic Tissue: Fat Layer (Subcutaneous Tissue): Yes N/A N/A Exposed Structures: Fascia: No Tendon: No Muscle: No Joint: No Bone: No Small (1-33%) N/A N/A Epithelialization: Debridement - Selective/Open Wound N/A N/A Debridement: Pre-procedure Verification/Time Out 15:08 N/A N/A Taken: Lidocaine 4% Topical Solution N/A N/A Pain Control: Slough N/A N/A Tissue  Debrided: Non-Viable Tissue N/A N/A Level: 2 N/A N/A Debridement A (sq cm): rea Curette N/A N/A Instrument: Minimum N/A N/A Bleeding: Pressure N/A N/A Hemostasis A chieved: Procedure was tolerated well N/A N/A Debridement Treatment Response: 1.7x1.5x0.1 N/A N/A Post Debridement Measurements L x W x D (cm) 0.2 N/A N/A Post Debridement Volume: (cm) Scarring: Yes N/A N/A Periwound Skin Texture: Maceration: No N/A N/A Periwound Skin Moisture: Dry/Scaly: No Rubor: Yes N/A N/A Periwound Skin Color: No Abnormality N/A N/A Temperature: Yes N/A N/A Tenderness on Palpation: Debridement N/A N/A Procedures Performed: Treatment Notes Wound #2 (Lower Leg) Wound Laterality: Left, Posterior Cleanser Soap and Water Discharge Instruction: May shower and wash wound with dial antibacterial soap and water prior to dressing change. Peri-Wound Care Ketoconazole Cream 2% Discharge Instruction: Apply Ketoconazole as directed Triamcinolone 15 (g) Discharge Instruction: Use triamcinolone 15 (g) as directed [name], [name] ([id number]) [id number].pdf Page 4 of 8 Zinc Oxide Ointment 30g tube Discharge Instruction: Apply Zinc Oxide to periwound with each dressing change Sween Lotion (Moisturizing lotion) Discharge Instruction: Apply moisturizing lotion as directed Topical Gentamicin Discharge Instruction: As directed by physician Mupirocin Ointment Discharge Instruction: Apply Mupirocin (Bactroban) as instructed Skintegrity Hydrogel 4 (oz) Discharge Instruction: Apply hydrogel as directed Primary Dressing Endoform 2x2 in Discharge Instruction: Moisten with saline Secondary Dressing Optifoam Non-Adhesive Dressing, 4x4 in Discharge Instruction: Apply over primary dressing as directed. Zetuvit Plus 4x8 in Discharge Instruction: Apply over primary dressing as directed. Secured With Compression Wrap CoFlex Calamine Unna Boot 4 x 6 (in/yd) Discharge Instruction:  Apply Coflex Calamine [employer] as directed. Compression Stockings Add-Ons Electronic Signature(s) Signed: 07/17/2023 4:07:38 PM By: Duanne Guess MD FACS Entered By: Duanne Guess on 07/17/2023 13:07:38 -------------------------------------------------------------------------------- Multi-Disciplinary Care Plan Details Patient Name: Date of Service: [name], [name] 07/17/2023 2:30 PM Medical Record Number: [id number] Patient Account Number: [id number] Date of Birth/Sex: Treating RN: [birth year] (77 y.o. [name] Primary Care Cristopher Ciccarelli: Eustaquio Boyden Other Clinician: Referring Shelsea Hangartner: Treating Edem Tiegs/Extender: Priscille Loveless in Treatment: 50 Multidisciplinary Care Plan reviewed with physician Active Inactive Venous Leg Ulcer Nursing Diagnoses: Knowledge deficit related to disease process and management Potential for venous Insuffiency (use before diagnosis confirmed) Goals: Patient will maintain optimal edema control Date Initiated: 02/07/2023 Target Resolution Date: 08/02/2023 Goal Status: Active Interventions: [name], [name] ([id number]) [id number] Page 5 of 8 Assess peripheral edema status every visit. Compression as ordered Treatment Activities: Therapeutic compression applied : 02/07/2023 Notes: Wound/Skin Impairment Nursing Diagnoses: Impaired tissue integrity Goals: Ulcer/skin breakdown will have a volume reduction of 30% by week 4 Date Initiated:

## 2023-07-22 NOTE — Progress Notes (Signed)
Leg: Other: - Elevate legs at heart level or above heart level while sitting. WOUND #2: - Lower Leg Wound Laterality: Left, Posterior Cleanser: Soap and Water 1 x Per Week/30 Days Discharge Instructions: May shower and wash wound with dial antibacterial soap and water prior to dressing change. [name], [name] ([id number]) [id number] Page 10 of  12 Peri-Wound Care: Ketoconazole Cream 2% 1 x Per Week/30 Days Discharge Instructions: Apply Ketoconazole as directed Peri-Wound Care: Triamcinolone 15 (g) 1 x Per Week/30 Days Discharge Instructions: Use triamcinolone 15 (g) as directed Peri-Wound Care: Zinc Oxide Ointment 30g tube 1 x Per Week/30 Days Discharge Instructions: Apply Zinc Oxide to periwound with each dressing change Peri-Wound Care: Sween Lotion (Moisturizing lotion) 1 x Per Week/30 Days Discharge Instructions: Apply moisturizing lotion as directed Topical: Gentamicin 1 x Per Week/30 Days Discharge Instructions: As directed by physician Topical: Mupirocin Ointment 1 x Per Week/30 Days Discharge Instructions: Apply Mupirocin (Bactroban) as instructed Topical: Skintegrity Hydrogel 4 (oz) 1 x Per Week/30 Days Discharge Instructions: Apply hydrogel as directed Prim Dressing: Endoform 2x2 in 1 x Per Week/30 Days ary Discharge Instructions: Moisten with saline Secondary Dressing: Optifoam Non-Adhesive Dressing, 4x4 in 1 x Per Week/30 Days Discharge Instructions: Apply over primary dressing as directed. Secondary Dressing: Zetuvit Plus 4x8 in 1 x Per Week/30 Days Discharge Instructions: Apply over primary dressing as directed. Com pression Wrap: CoFlex Calamine Unna Boot 4 x 6 (in/yd) 1 x Per Week/30 Days Discharge Instructions: Apply Coflex Calamine [employer] as directed. 07/17/2023: The wound is smaller again today. Moisture balance is appropriate. There is a little bit of slough overlying granulation tissue. I used a curette to debride the slough from her wound. We will continue the mixture of topical gentamicin and mupirocin with endoform and Optifoam cover to maintain moisture balance. Continue calamine based Radio broadcast assistant. Follow-up in 1 week. Electronic Signature(s) Signed: 07/17/2023 4:10:34 PM By: Duanne Guess MD FACS Entered By: Duanne Guess on 07/17/2023  13:10:34 -------------------------------------------------------------------------------- HxROS Details Patient Name: Date of Service: [name], [name] 07/17/2023 2:30 PM Medical Record Number: [id number] Patient Account Number: [id number] Date of Birth/Sex: Treating RN: 1946-09-16 (77 y.o. F) Primary Care Provider: Eustaquio Boyden Other Clinician: Referring Provider: Treating Provider/Extender: Priscille Loveless in Treatment: 50 Information Obtained From Patient Eyes Medical History: Positive for: Cataracts - L eye Ear/Nose/Mouth/Throat Medical History: Negative for: Chronic sinus problems/congestion; Middle ear problems Hematologic/Lymphatic Medical History: Negative for: Anemia; Human Immunodeficiency Virus; Lymphedema Respiratory Medical History: Positive for: Asthma Past Medical History Notes: OSA on CPAP Cardiovascular [name], [name] ([id number]) [id number] Page 11 of 12 Medical History: Positive for: Hypertension Past Medical History Notes: Cronic venous insufficiency 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 Gastrointestinal Medical History: Past Medical History Notes: liver cyst Endocrine Medical History: Negative for: Type I Diabetes; Type II Diabetes Musculoskeletal Medical History: Past Medical History Notes: arthritis Neurologic Medical History: Positive for: Neuropathy - Feet and finger Negative for: Seizure Disorder Past Medical History Notes: restless leg syndrome Oncologic Medical History: Positive for: Received Chemotherapy - 2012; Received Radiation - 2012 HBO Extended History Items Eyes: Cataracts Immunizations Pneumococcal Vaccine: Received Pneumococcal Vaccination: Yes Received Pneumococcal Vaccination On or After 60th Birthday: Yes Implantable Devices No devices added Hospitalization / Surgery History Type of Hospitalization/Surgery Lower extremity venography  2020 Intravascular ultrasound peripheral vascular intervention melanoma 2011 Family and Social History Cancer: Yes - Mother,Paternal Grandparents; Diabetes: Yes - Siblings; Heart Disease: Yes - Father; Hypertension: Yes - Father; Former smoker - quit in 1967  Electronic Signature(s) Signed: 07/17/2023 5:39:35 PM By: Duanne Guess MD FACS Entered By: Duanne Guess on 07/17/2023 13:09:17 -------------------------------------------------------------------------------- SuperBill Details Patient Name: Date of Service: [name] 07/17/2023 Medical Record Number: [id number] Patient Account Number: [id number] [name], [name] ([id number]) [id number] Page 12 of 12 Date of Birth/Sex: Treating RN: 1946-05-01 (77 y.o. F) Primary Care Provider: Eustaquio Boyden Other Clinician: Referring Provider: Treating Provider/Extender: Priscille Loveless in Treatment: 50 Diagnosis Coding ICD-10 Codes Code Description [phone] Non-pressure chronic ulcer of other part of left lower leg with fat layer exposed I83.893 Varicose veins of bilateral lower extremities with other complications I87.2 Venous insufficiency (chronic) (peripheral) G62.9 Polyneuropathy, unspecified R60.0 Localized edema Facility Procedures : CPT4 Code: [id number] Description: 97597 - DEBRIDE WOUND 1ST 20 SQ CM OR < ICD-10 Diagnosis Description L97.822 Non-pressure chronic ulcer of other part of left lower leg with fat layer expose Modifier: d Quantity: 1 Physician Procedures : CPT4 Code Description Modifier [id number] 99214 - WC PHYS LEVEL 4 - EST PT 25 ICD-10 Diagnosis Description L97.822 Non-pressure chronic ulcer of other part of left lower leg with fat layer exposed I83.893 Varicose veins of bilateral lower extremities with  other complications I87.2 Venous insufficiency (chronic) (peripheral) R60.0 Localized edema Quantity: 1 : [id number] 97597 - WC PHYS DEBR WO ANESTH 20 SQ CM  ICD-10 Diagnosis Description L97.822 Non-pressure chronic ulcer of other part of left lower leg with fat layer exposed Quantity: 1 Electronic Signature(s) Signed: 07/17/2023 4:10:55 PM By: Duanne Guess MD FACS Entered By: Duanne Guess on 07/17/2023 13:10:55  Leg: Other: - Elevate legs at heart level or above heart level while sitting. WOUND #2: - Lower Leg Wound Laterality: Left, Posterior Cleanser: Soap and Water 1 x Per Week/30 Days Discharge Instructions: May shower and wash wound with dial antibacterial soap and water prior to dressing change. [name], [name] ([id number]) [id number] Page 10 of  12 Peri-Wound Care: Ketoconazole Cream 2% 1 x Per Week/30 Days Discharge Instructions: Apply Ketoconazole as directed Peri-Wound Care: Triamcinolone 15 (g) 1 x Per Week/30 Days Discharge Instructions: Use triamcinolone 15 (g) as directed Peri-Wound Care: Zinc Oxide Ointment 30g tube 1 x Per Week/30 Days Discharge Instructions: Apply Zinc Oxide to periwound with each dressing change Peri-Wound Care: Sween Lotion (Moisturizing lotion) 1 x Per Week/30 Days Discharge Instructions: Apply moisturizing lotion as directed Topical: Gentamicin 1 x Per Week/30 Days Discharge Instructions: As directed by physician Topical: Mupirocin Ointment 1 x Per Week/30 Days Discharge Instructions: Apply Mupirocin (Bactroban) as instructed Topical: Skintegrity Hydrogel 4 (oz) 1 x Per Week/30 Days Discharge Instructions: Apply hydrogel as directed Prim Dressing: Endoform 2x2 in 1 x Per Week/30 Days ary Discharge Instructions: Moisten with saline Secondary Dressing: Optifoam Non-Adhesive Dressing, 4x4 in 1 x Per Week/30 Days Discharge Instructions: Apply over primary dressing as directed. Secondary Dressing: Zetuvit Plus 4x8 in 1 x Per Week/30 Days Discharge Instructions: Apply over primary dressing as directed. Com pression Wrap: CoFlex Calamine Unna Boot 4 x 6 (in/yd) 1 x Per Week/30 Days Discharge Instructions: Apply Coflex Calamine [employer] as directed. 07/17/2023: The wound is smaller again today. Moisture balance is appropriate. There is a little bit of slough overlying granulation tissue. I used a curette to debride the slough from her wound. We will continue the mixture of topical gentamicin and mupirocin with endoform and Optifoam cover to maintain moisture balance. Continue calamine based Radio broadcast assistant. Follow-up in 1 week. Electronic Signature(s) Signed: 07/17/2023 4:10:34 PM By: Duanne Guess MD FACS Entered By: Duanne Guess on 07/17/2023  13:10:34 -------------------------------------------------------------------------------- HxROS Details Patient Name: Date of Service: [name], [name] 07/17/2023 2:30 PM Medical Record Number: [id number] Patient Account Number: [id number] Date of Birth/Sex: Treating RN: 1946-09-16 (77 y.o. F) Primary Care Provider: Eustaquio Boyden Other Clinician: Referring Provider: Treating Provider/Extender: Priscille Loveless in Treatment: 50 Information Obtained From Patient Eyes Medical History: Positive for: Cataracts - L eye Ear/Nose/Mouth/Throat Medical History: Negative for: Chronic sinus problems/congestion; Middle ear problems Hematologic/Lymphatic Medical History: Negative for: Anemia; Human Immunodeficiency Virus; Lymphedema Respiratory Medical History: Positive for: Asthma Past Medical History Notes: OSA on CPAP Cardiovascular [name], [name] ([id number]) [id number] Page 11 of 12 Medical History: Positive for: Hypertension Past Medical History Notes: Cronic venous insufficiency 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 Gastrointestinal Medical History: Past Medical History Notes: liver cyst Endocrine Medical History: Negative for: Type I Diabetes; Type II Diabetes Musculoskeletal Medical History: Past Medical History Notes: arthritis Neurologic Medical History: Positive for: Neuropathy - Feet and finger Negative for: Seizure Disorder Past Medical History Notes: restless leg syndrome Oncologic Medical History: Positive for: Received Chemotherapy - 2012; Received Radiation - 2012 HBO Extended History Items Eyes: Cataracts Immunizations Pneumococcal Vaccine: Received Pneumococcal Vaccination: Yes Received Pneumococcal Vaccination On or After 60th Birthday: Yes Implantable Devices No devices added Hospitalization / Surgery History Type of Hospitalization/Surgery Lower extremity venography  2020 Intravascular ultrasound peripheral vascular intervention melanoma 2011 Family and Social History Cancer: Yes - Mother,Paternal Grandparents; Diabetes: Yes - Siblings; Heart Disease: Yes - Father; Hypertension: Yes - Father; Former smoker - quit in 1967  Electronic Signature(s) Signed: 07/17/2023 5:39:35 PM By: Duanne Guess MD FACS Entered By: Duanne Guess on 07/17/2023 13:09:17 -------------------------------------------------------------------------------- SuperBill Details Patient Name: Date of Service: [name] 07/17/2023 Medical Record Number: [id number] Patient Account Number: [id number] [name], [name] ([id number]) [id number] Page 12 of 12 Date of Birth/Sex: Treating RN: 1946-05-01 (77 y.o. F) Primary Care Provider: Eustaquio Boyden Other Clinician: Referring Provider: Treating Provider/Extender: Priscille Loveless in Treatment: 50 Diagnosis Coding ICD-10 Codes Code Description [phone] Non-pressure chronic ulcer of other part of left lower leg with fat layer exposed I83.893 Varicose veins of bilateral lower extremities with other complications I87.2 Venous insufficiency (chronic) (peripheral) G62.9 Polyneuropathy, unspecified R60.0 Localized edema Facility Procedures : CPT4 Code: [id number] Description: 97597 - DEBRIDE WOUND 1ST 20 SQ CM OR < ICD-10 Diagnosis Description L97.822 Non-pressure chronic ulcer of other part of left lower leg with fat layer expose Modifier: d Quantity: 1 Physician Procedures : CPT4 Code Description Modifier [id number] 99214 - WC PHYS LEVEL 4 - EST PT 25 ICD-10 Diagnosis Description L97.822 Non-pressure chronic ulcer of other part of left lower leg with fat layer exposed I83.893 Varicose veins of bilateral lower extremities with  other complications I87.2 Venous insufficiency (chronic) (peripheral) R60.0 Localized edema Quantity: 1 : [id number] 97597 - WC PHYS DEBR WO ANESTH 20 SQ CM  ICD-10 Diagnosis Description L97.822 Non-pressure chronic ulcer of other part of left lower leg with fat layer exposed Quantity: 1 Electronic Signature(s) Signed: 07/17/2023 4:10:55 PM By: Duanne Guess MD FACS Entered By: Duanne Guess on 07/17/2023 13:10:55  Leg: Other: - Elevate legs at heart level or above heart level while sitting. WOUND #2: - Lower Leg Wound Laterality: Left, Posterior Cleanser: Soap and Water 1 x Per Week/30 Days Discharge Instructions: May shower and wash wound with dial antibacterial soap and water prior to dressing change. [name], [name] ([id number]) [id number] Page 10 of  12 Peri-Wound Care: Ketoconazole Cream 2% 1 x Per Week/30 Days Discharge Instructions: Apply Ketoconazole as directed Peri-Wound Care: Triamcinolone 15 (g) 1 x Per Week/30 Days Discharge Instructions: Use triamcinolone 15 (g) as directed Peri-Wound Care: Zinc Oxide Ointment 30g tube 1 x Per Week/30 Days Discharge Instructions: Apply Zinc Oxide to periwound with each dressing change Peri-Wound Care: Sween Lotion (Moisturizing lotion) 1 x Per Week/30 Days Discharge Instructions: Apply moisturizing lotion as directed Topical: Gentamicin 1 x Per Week/30 Days Discharge Instructions: As directed by physician Topical: Mupirocin Ointment 1 x Per Week/30 Days Discharge Instructions: Apply Mupirocin (Bactroban) as instructed Topical: Skintegrity Hydrogel 4 (oz) 1 x Per Week/30 Days Discharge Instructions: Apply hydrogel as directed Prim Dressing: Endoform 2x2 in 1 x Per Week/30 Days ary Discharge Instructions: Moisten with saline Secondary Dressing: Optifoam Non-Adhesive Dressing, 4x4 in 1 x Per Week/30 Days Discharge Instructions: Apply over primary dressing as directed. Secondary Dressing: Zetuvit Plus 4x8 in 1 x Per Week/30 Days Discharge Instructions: Apply over primary dressing as directed. Com pression Wrap: CoFlex Calamine Unna Boot 4 x 6 (in/yd) 1 x Per Week/30 Days Discharge Instructions: Apply Coflex Calamine [employer] as directed. 07/17/2023: The wound is smaller again today. Moisture balance is appropriate. There is a little bit of slough overlying granulation tissue. I used a curette to debride the slough from her wound. We will continue the mixture of topical gentamicin and mupirocin with endoform and Optifoam cover to maintain moisture balance. Continue calamine based Radio broadcast assistant. Follow-up in 1 week. Electronic Signature(s) Signed: 07/17/2023 4:10:34 PM By: Duanne Guess MD FACS Entered By: Duanne Guess on 07/17/2023  13:10:34 -------------------------------------------------------------------------------- HxROS Details Patient Name: Date of Service: [name], [name] 07/17/2023 2:30 PM Medical Record Number: [id number] Patient Account Number: [id number] Date of Birth/Sex: Treating RN: 1946-09-16 (77 y.o. F) Primary Care Provider: Eustaquio Boyden Other Clinician: Referring Provider: Treating Provider/Extender: Priscille Loveless in Treatment: 50 Information Obtained From Patient Eyes Medical History: Positive for: Cataracts - L eye Ear/Nose/Mouth/Throat Medical History: Negative for: Chronic sinus problems/congestion; Middle ear problems Hematologic/Lymphatic Medical History: Negative for: Anemia; Human Immunodeficiency Virus; Lymphedema Respiratory Medical History: Positive for: Asthma Past Medical History Notes: OSA on CPAP Cardiovascular [name], [name] ([id number]) [id number] Page 11 of 12 Medical History: Positive for: Hypertension Past Medical History Notes: Cronic venous insufficiency 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 Gastrointestinal Medical History: Past Medical History Notes: liver cyst Endocrine Medical History: Negative for: Type I Diabetes; Type II Diabetes Musculoskeletal Medical History: Past Medical History Notes: arthritis Neurologic Medical History: Positive for: Neuropathy - Feet and finger Negative for: Seizure Disorder Past Medical History Notes: restless leg syndrome Oncologic Medical History: Positive for: Received Chemotherapy - 2012; Received Radiation - 2012 HBO Extended History Items Eyes: Cataracts Immunizations Pneumococcal Vaccine: Received Pneumococcal Vaccination: Yes Received Pneumococcal Vaccination On or After 60th Birthday: Yes Implantable Devices No devices added Hospitalization / Surgery History Type of Hospitalization/Surgery Lower extremity venography  2020 Intravascular ultrasound peripheral vascular intervention melanoma 2011 Family and Social History Cancer: Yes - Mother,Paternal Grandparents; Diabetes: Yes - Siblings; Heart Disease: Yes - Father; Hypertension: Yes - Father; Former smoker - quit in 1967  Electronic Signature(s) Signed: 07/17/2023 5:39:35 PM By: Duanne Guess MD FACS Entered By: Duanne Guess on 07/17/2023 13:09:17 -------------------------------------------------------------------------------- SuperBill Details Patient Name: Date of Service: [name] 07/17/2023 Medical Record Number: [id number] Patient Account Number: [id number] [name], [name] ([id number]) [id number] Page 12 of 12 Date of Birth/Sex: Treating RN: 1946-05-01 (77 y.o. F) Primary Care Provider: Eustaquio Boyden Other Clinician: Referring Provider: Treating Provider/Extender: Priscille Loveless in Treatment: 50 Diagnosis Coding ICD-10 Codes Code Description [phone] Non-pressure chronic ulcer of other part of left lower leg with fat layer exposed I83.893 Varicose veins of bilateral lower extremities with other complications I87.2 Venous insufficiency (chronic) (peripheral) G62.9 Polyneuropathy, unspecified R60.0 Localized edema Facility Procedures : CPT4 Code: [id number] Description: 97597 - DEBRIDE WOUND 1ST 20 SQ CM OR < ICD-10 Diagnosis Description L97.822 Non-pressure chronic ulcer of other part of left lower leg with fat layer expose Modifier: d Quantity: 1 Physician Procedures : CPT4 Code Description Modifier [id number] 99214 - WC PHYS LEVEL 4 - EST PT 25 ICD-10 Diagnosis Description L97.822 Non-pressure chronic ulcer of other part of left lower leg with fat layer exposed I83.893 Varicose veins of bilateral lower extremities with  other complications I87.2 Venous insufficiency (chronic) (peripheral) R60.0 Localized edema Quantity: 1 : [id number] 97597 - WC PHYS DEBR WO ANESTH 20 SQ CM  ICD-10 Diagnosis Description L97.822 Non-pressure chronic ulcer of other part of left lower leg with fat layer exposed Quantity: 1 Electronic Signature(s) Signed: 07/17/2023 4:10:55 PM By: Duanne Guess MD FACS Entered By: Duanne Guess on 07/17/2023 13:10:55  Leg: Other: - Elevate legs at heart level or above heart level while sitting. WOUND #2: - Lower Leg Wound Laterality: Left, Posterior Cleanser: Soap and Water 1 x Per Week/30 Days Discharge Instructions: May shower and wash wound with dial antibacterial soap and water prior to dressing change. [name], [name] ([id number]) [id number] Page 10 of  12 Peri-Wound Care: Ketoconazole Cream 2% 1 x Per Week/30 Days Discharge Instructions: Apply Ketoconazole as directed Peri-Wound Care: Triamcinolone 15 (g) 1 x Per Week/30 Days Discharge Instructions: Use triamcinolone 15 (g) as directed Peri-Wound Care: Zinc Oxide Ointment 30g tube 1 x Per Week/30 Days Discharge Instructions: Apply Zinc Oxide to periwound with each dressing change Peri-Wound Care: Sween Lotion (Moisturizing lotion) 1 x Per Week/30 Days Discharge Instructions: Apply moisturizing lotion as directed Topical: Gentamicin 1 x Per Week/30 Days Discharge Instructions: As directed by physician Topical: Mupirocin Ointment 1 x Per Week/30 Days Discharge Instructions: Apply Mupirocin (Bactroban) as instructed Topical: Skintegrity Hydrogel 4 (oz) 1 x Per Week/30 Days Discharge Instructions: Apply hydrogel as directed Prim Dressing: Endoform 2x2 in 1 x Per Week/30 Days ary Discharge Instructions: Moisten with saline Secondary Dressing: Optifoam Non-Adhesive Dressing, 4x4 in 1 x Per Week/30 Days Discharge Instructions: Apply over primary dressing as directed. Secondary Dressing: Zetuvit Plus 4x8 in 1 x Per Week/30 Days Discharge Instructions: Apply over primary dressing as directed. Com pression Wrap: CoFlex [employer] Unna Boot 4 x 6 (in/yd) 1 x Per Week/30 Days Discharge Instructions: Apply Coflex Calamine [employer] as directed. 07/17/2023: The wound is smaller again today. Moisture balance is appropriate. There is a little bit of slough overlying granulation tissue. I used a curette to debride the slough from her wound. We will continue the mixture of topical gentamicin and mupirocin with endoform and Optifoam cover to maintain moisture balance. Continue calamine based Radio broadcast assistant. Follow-up in 1 week. Electronic Signature(s) Signed: 07/17/2023 4:10:34 PM By: Duanne Guess MD FACS Entered By: Duanne Guess on 07/17/2023  13:10:34 -------------------------------------------------------------------------------- HxROS Details Patient Name: Date of Service: [name], [name] 07/17/2023 2:30 PM Medical Record Number: [id number] Patient Account Number: [id number] Date of Birth/Sex: Treating RN: 1946-09-16 (77 y.o. F) Primary Care Provider: Eustaquio Boyden Other Clinician: Referring Provider: Treating Provider/Extender: Priscille Loveless in Treatment: 50 Information Obtained From Patient Eyes Medical History: Positive for: Cataracts - L eye Ear/Nose/Mouth/Throat Medical History: Negative for: Chronic sinus problems/congestion; Middle ear problems Hematologic/Lymphatic Medical History: Negative for: Anemia; Human Immunodeficiency Virus; Lymphedema Respiratory Medical History: Positive for: Asthma Past Medical History Notes: OSA on CPAP Cardiovascular [name], [name] ([id number]) [id number] Page 11 of 12 Medical History: Positive for: Hypertension Past Medical History Notes: Cronic venous insufficiency 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 Gastrointestinal Medical History: Past Medical History Notes: liver cyst Endocrine Medical History: Negative for: Type I Diabetes; Type II Diabetes Musculoskeletal Medical History: Past Medical History Notes: arthritis Neurologic Medical History: Positive for: Neuropathy - Feet and finger Negative for: Seizure Disorder Past Medical History Notes: restless leg syndrome Oncologic Medical History: Positive for: Received Chemotherapy - 2012; Received Radiation - 2012 HBO Extended History Items Eyes: Cataracts Immunizations Pneumococcal Vaccine: Received Pneumococcal Vaccination: Yes Received Pneumococcal Vaccination On or After 60th Birthday: Yes Implantable Devices No devices added Hospitalization / Surgery History Type of Hospitalization/Surgery Lower extremity venography  2020 Intravascular ultrasound peripheral vascular intervention melanoma 2011 Family and Social History Cancer: Yes - Mother,Paternal Grandparents; Diabetes: Yes - Siblings; Heart Disease: Yes - Father; Hypertension: Yes - Father; Former smoker - quit in 1967  is a little bit of slough overlying granulation tissue. [name], [name] ([id number]) [id number] Page 4 of 12 Electronic Signature(s) Signed: 07/17/2023 4:08:58 PM By: Duanne Guess MD FACS Entered By: Duanne Guess on 07/17/2023 13:08:58 -------------------------------------------------------------------------------- Physical Exam Details Patient Name: Date of Service: [name], [name] 07/17/2023 2:30 PM Medical Record Number: [id number] Patient Account Number:  [id number] Date of Birth/Sex: Treating RN: Mar 19, 1946 (77 y.o. F) Primary Care Provider: Eustaquio Boyden Other Clinician: Referring Provider: Treating Provider/Extender: Priscille Loveless in Treatment: 50 Constitutional Hypertensive, asymptomatic. . . . no acute distress. Respiratory Normal work of breathing on room air. Notes 07/17/2023: The wound is smaller again today. Moisture balance is appropriate. There is a little bit of slough overlying granulation tissue. Electronic Signature(s) Signed: 07/17/2023 4:09:42 PM By: Duanne Guess MD FACS Entered By: Duanne Guess on 07/17/2023 13:09:42 -------------------------------------------------------------------------------- Physician Orders Details Patient Name: Date of Service: [name], [name] 07/17/2023 2:30 PM Medical Record Number: [id number] Patient Account Number: [id number] Date of Birth/Sex: Treating RN: Aug 05, 1946 (77 y.o. [name] Primary Care Provider: Eustaquio Boyden Other Clinician: Referring Provider: Treating Provider/Extender: Priscille Loveless in Treatment: 55 Verbal / Phone Orders: No Diagnosis Coding ICD-10 Coding Code Description [phone] Non-pressure chronic ulcer of other part of left lower leg with fat layer exposed I83.893 Varicose veins of bilateral lower extremities with other complications I87.2 Venous insufficiency (chronic) (peripheral) G62.9 Polyneuropathy, unspecified R60.0 Localized edema Follow-up Appointments ppointment in 1 week. - Dr. Lady Gary Room 3 Return A Anesthetic Wound #2 Left,Posterior Lower Leg (In clinic) Topical Lidocaine 4% applied to wound bed [address] [name], [name] ([id number]) [id number] Page 5 of 12 May shower with protection but do not get wound dressing(s) wet. Protect dressing(s) with water repellant cover (for example, large plastic bag) or a cast cover and may then  take shower. Edema Control - Lymphedema / SCD / Other Left Lower Extremity Lymphedema Pumps. Use Lymphedema pumps on leg(s) 2-3 times a day for 45-60 minutes. If wearing any wraps or hose, do not remove them. Continue exercising as instructed. - Use x1 per day Avoid standing for long periods of time. Exercise regularly Off-Loading Wound #2 Left,Posterior Lower Leg Other: - Elevate legs at heart level or above heart level while sitting. Wound Treatment Wound #2 - Lower Leg Wound Laterality: Left, Posterior Cleanser: Soap and Water 1 x Per Week/30 Days Discharge Instructions: May shower and wash wound with dial antibacterial soap and water prior to dressing change. Peri-Wound Care: Ketoconazole Cream 2% 1 x Per Week/30 Days Discharge Instructions: Apply Ketoconazole as directed Peri-Wound Care: Triamcinolone 15 (g) 1 x Per Week/30 Days Discharge Instructions: Use triamcinolone 15 (g) as directed Peri-Wound Care: Zinc Oxide Ointment 30g tube 1 x Per Week/30 Days Discharge Instructions: Apply Zinc Oxide to periwound with each dressing change Peri-Wound Care: Sween Lotion (Moisturizing lotion) 1 x Per Week/30 Days Discharge Instructions: Apply moisturizing lotion as directed Topical: Gentamicin 1 x Per Week/30 Days Discharge Instructions: As directed by physician Topical: Mupirocin Ointment 1 x Per Week/30 Days Discharge Instructions: Apply Mupirocin (Bactroban) as instructed Topical: Skintegrity Hydrogel 4 (oz) 1 x Per Week/30 Days Discharge Instructions: Apply hydrogel as directed Prim Dressing: Endoform 2x2 in 1 x Per Week/30 Days ary Discharge Instructions: Moisten with saline Secondary Dressing: Optifoam Non-Adhesive Dressing, 4x4 in 1 x Per Week/30 Days Discharge Instructions: Apply over primary dressing as directed. Secondary Dressing: Zetuvit Plus 4x8 in 1 x Per Week/30 Days Discharge Instructions: Apply over primary dressing as directed.  Leg: Other: - Elevate legs at heart level or above heart level while sitting. WOUND #2: - Lower Leg Wound Laterality: Left, Posterior Cleanser: Soap and Water 1 x Per Week/30 Days Discharge Instructions: May shower and wash wound with dial antibacterial soap and water prior to dressing change. [name], [name] ([id number]) [id number] Page 10 of  12 Peri-Wound Care: Ketoconazole Cream 2% 1 x Per Week/30 Days Discharge Instructions: Apply Ketoconazole as directed Peri-Wound Care: Triamcinolone 15 (g) 1 x Per Week/30 Days Discharge Instructions: Use triamcinolone 15 (g) as directed Peri-Wound Care: Zinc Oxide Ointment 30g tube 1 x Per Week/30 Days Discharge Instructions: Apply Zinc Oxide to periwound with each dressing change Peri-Wound Care: Sween Lotion (Moisturizing lotion) 1 x Per Week/30 Days Discharge Instructions: Apply moisturizing lotion as directed Topical: Gentamicin 1 x Per Week/30 Days Discharge Instructions: As directed by physician Topical: Mupirocin Ointment 1 x Per Week/30 Days Discharge Instructions: Apply Mupirocin (Bactroban) as instructed Topical: Skintegrity Hydrogel 4 (oz) 1 x Per Week/30 Days Discharge Instructions: Apply hydrogel as directed Prim Dressing: Endoform 2x2 in 1 x Per Week/30 Days ary Discharge Instructions: Moisten with saline Secondary Dressing: Optifoam Non-Adhesive Dressing, 4x4 in 1 x Per Week/30 Days Discharge Instructions: Apply over primary dressing as directed. Secondary Dressing: Zetuvit Plus 4x8 in 1 x Per Week/30 Days Discharge Instructions: Apply over primary dressing as directed. Com pression Wrap: CoFlex Calamine Unna Boot 4 x 6 (in/yd) 1 x Per Week/30 Days Discharge Instructions: Apply Coflex [employer]amine [employer] as directed. 07/17/2023: The wound is smaller again today. Moisture balance is appropriate. There is a little bit of slough overlying granulation tissue. I used a curette to debride the slough from her wound. We will continue the mixture of topical gentamicin and mupirocin with endoform and Optifoam cover to maintain moisture balance. Continue calamine based Radio broadcast assistant. Follow-up in 1 week. Electronic Signature(s) Signed: 07/17/2023 4:10:34 PM By: Duanne Guess MD FACS Entered By: Duanne Guess on 07/17/2023  13:10:34 -------------------------------------------------------------------------------- HxROS Details Patient Name: Date of Service: [name], [name] 07/17/2023 2:30 PM Medical Record Number: [id number] Patient Account Number: [id number] Date of Birth/Sex: Treating RN: 1946-09-16 (77 y.o. F) Primary Care Provider: Eustaquio Boyden Other Clinician: Referring Provider: Treating Provider/Extender: Priscille Loveless in Treatment: 50 Information Obtained From Patient Eyes Medical History: Positive for: Cataracts - L eye Ear/Nose/Mouth/Throat Medical History: Negative for: Chronic sinus problems/congestion; Middle ear problems Hematologic/Lymphatic Medical History: Negative for: Anemia; Human Immunodeficiency Virus; Lymphedema Respiratory Medical History: Positive for: Asthma Past Medical History Notes: OSA on CPAP Cardiovascular [name], [name] ([id number]) [id number] Page 11 of 12 Medical History: Positive for: Hypertension Past Medical History Notes: Cronic venous insufficiency 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 Gastrointestinal Medical History: Past Medical History Notes: liver cyst Endocrine Medical History: Negative for: Type I Diabetes; Type II Diabetes Musculoskeletal Medical History: Past Medical History Notes: arthritis Neurologic Medical History: Positive for: Neuropathy - Feet and finger Negative for: Seizure Disorder Past Medical History Notes: restless leg syndrome Oncologic Medical History: Positive for: Received Chemotherapy - 2012; Received Radiation - 2012 HBO Extended History Items Eyes: Cataracts Immunizations Pneumococcal Vaccine: Received Pneumococcal Vaccination: Yes Received Pneumococcal Vaccination On or After 60th Birthday: Yes Implantable Devices No devices added Hospitalization / Surgery History Type of Hospitalization/Surgery Lower extremity venography  2020 Intravascular ultrasound peripheral vascular intervention melanoma 2011 Family and Social History Cancer: Yes - Mother,Paternal Grandparents; Diabetes: Yes - Siblings; Heart Disease: Yes - Father; Hypertension: Yes - Father; Former smoker - quit in 1967  Electronic Signature(s) Signed: 07/17/2023 5:39:35 PM By: Duanne Guess MD FACS Entered By: Duanne Guess on 07/17/2023 13:09:17 -------------------------------------------------------------------------------- SuperBill Details Patient Name: Date of Service: [name] 07/17/2023 Medical Record Number: [id number] Patient Account Number: [id number] [name], [name] ([id number]) [id number] Page 12 of 12 Date of Birth/Sex: Treating RN: 1946-05-01 (77 y.o. F) Primary Care Provider: Eustaquio Boyden Other Clinician: Referring Provider: Treating Provider/Extender: Priscille Loveless in Treatment: 50 Diagnosis Coding ICD-10 Codes Code Description [phone] Non-pressure chronic ulcer of other part of left lower leg with fat layer exposed I83.893 Varicose veins of bilateral lower extremities with other complications I87.2 Venous insufficiency (chronic) (peripheral) G62.9 Polyneuropathy, unspecified R60.0 Localized edema Facility Procedures : CPT4 Code: [id number] Description: 97597 - DEBRIDE WOUND 1ST 20 SQ CM OR < ICD-10 Diagnosis Description L97.822 Non-pressure chronic ulcer of other part of left lower leg with fat layer expose Modifier: d Quantity: 1 Physician Procedures : CPT4 Code Description Modifier [id number] 99214 - WC PHYS LEVEL 4 - EST PT 25 ICD-10 Diagnosis Description L97.822 Non-pressure chronic ulcer of other part of left lower leg with fat layer exposed I83.893 Varicose veins of bilateral lower extremities with  other complications I87.2 Venous insufficiency (chronic) (peripheral) R60.0 Localized edema Quantity: 1 : [id number] 97597 - WC PHYS DEBR WO ANESTH 20 SQ CM  ICD-10 Diagnosis Description L97.822 Non-pressure chronic ulcer of other part of left lower leg with fat layer exposed Quantity: 1 Electronic Signature(s) Signed: 07/17/2023 4:10:55 PM By: Duanne Guess MD FACS Entered By: Duanne Guess on 07/17/2023 13:10:55  is a little bit of slough overlying granulation tissue. [name], [name] ([id number]) [id number] Page 4 of 12 Electronic Signature(s) Signed: 07/17/2023 4:08:58 PM By: Duanne Guess MD FACS Entered By: Duanne Guess on 07/17/2023 13:08:58 -------------------------------------------------------------------------------- Physical Exam Details Patient Name: Date of Service: [name], [name] 07/17/2023 2:30 PM Medical Record Number: [id number] Patient Account Number:  [id number] Date of Birth/Sex: Treating RN: Mar 19, 1946 (77 y.o. F) Primary Care Provider: Eustaquio Boyden Other Clinician: Referring Provider: Treating Provider/Extender: Priscille Loveless in Treatment: 50 Constitutional Hypertensive, asymptomatic. . . . no acute distress. Respiratory Normal work of breathing on room air. Notes 07/17/2023: The wound is smaller again today. Moisture balance is appropriate. There is a little bit of slough overlying granulation tissue. Electronic Signature(s) Signed: 07/17/2023 4:09:42 PM By: Duanne Guess MD FACS Entered By: Duanne Guess on 07/17/2023 13:09:42 -------------------------------------------------------------------------------- Physician Orders Details Patient Name: Date of Service: [name], [name] 07/17/2023 2:30 PM Medical Record Number: [id number] Patient Account Number: [id number] Date of Birth/Sex: Treating RN: Aug 05, 1946 (77 y.o. [name] Primary Care Provider: Eustaquio Boyden Other Clinician: Referring Provider: Treating Provider/Extender: Priscille Loveless in Treatment: 55 Verbal / Phone Orders: No Diagnosis Coding ICD-10 Coding Code Description [phone] Non-pressure chronic ulcer of other part of left lower leg with fat layer exposed I83.893 Varicose veins of bilateral lower extremities with other complications I87.2 Venous insufficiency (chronic) (peripheral) G62.9 Polyneuropathy, unspecified R60.0 Localized edema Follow-up Appointments ppointment in 1 week. - Dr. Lady Gary Room 3 Return A Anesthetic Wound #2 Left,Posterior Lower Leg (In clinic) Topical Lidocaine 4% applied to wound bed [address] [name], [name] ([id number]) [id number] Page 5 of 12 May shower with protection but do not get wound dressing(s) wet. Protect dressing(s) with water repellant cover (for example, large plastic bag) or a cast cover and may then  take shower. Edema Control - Lymphedema / SCD / Other Left Lower Extremity Lymphedema Pumps. Use Lymphedema pumps on leg(s) 2-3 times a day for 45-60 minutes. If wearing any wraps or hose, do not remove them. Continue exercising as instructed. - Use x1 per day Avoid standing for long periods of time. Exercise regularly Off-Loading Wound #2 Left,Posterior Lower Leg Other: - Elevate legs at heart level or above heart level while sitting. Wound Treatment Wound #2 - Lower Leg Wound Laterality: Left, Posterior Cleanser: Soap and Water 1 x Per Week/30 Days Discharge Instructions: May shower and wash wound with dial antibacterial soap and water prior to dressing change. Peri-Wound Care: Ketoconazole Cream 2% 1 x Per Week/30 Days Discharge Instructions: Apply Ketoconazole as directed Peri-Wound Care: Triamcinolone 15 (g) 1 x Per Week/30 Days Discharge Instructions: Use triamcinolone 15 (g) as directed Peri-Wound Care: Zinc Oxide Ointment 30g tube 1 x Per Week/30 Days Discharge Instructions: Apply Zinc Oxide to periwound with each dressing change Peri-Wound Care: Sween Lotion (Moisturizing lotion) 1 x Per Week/30 Days Discharge Instructions: Apply moisturizing lotion as directed Topical: Gentamicin 1 x Per Week/30 Days Discharge Instructions: As directed by physician Topical: Mupirocin Ointment 1 x Per Week/30 Days Discharge Instructions: Apply Mupirocin (Bactroban) as instructed Topical: Skintegrity Hydrogel 4 (oz) 1 x Per Week/30 Days Discharge Instructions: Apply hydrogel as directed Prim Dressing: Endoform 2x2 in 1 x Per Week/30 Days ary Discharge Instructions: Moisten with saline Secondary Dressing: Optifoam Non-Adhesive Dressing, 4x4 in 1 x Per Week/30 Days Discharge Instructions: Apply over primary dressing as directed. Secondary Dressing: Zetuvit Plus 4x8 in 1 x Per Week/30 Days Discharge Instructions: Apply over primary dressing as directed.  Leg: Other: - Elevate legs at heart level or above heart level while sitting. WOUND #2: - Lower Leg Wound Laterality: Left, Posterior Cleanser: Soap and Water 1 x Per Week/30 Days Discharge Instructions: May shower and wash wound with dial antibacterial soap and water prior to dressing change. [name], [name] ([id number]) [id number] Page 10 of  12 Peri-Wound Care: Ketoconazole Cream 2% 1 x Per Week/30 Days Discharge Instructions: Apply Ketoconazole as directed Peri-Wound Care: Triamcinolone 15 (g) 1 x Per Week/30 Days Discharge Instructions: Use triamcinolone 15 (g) as directed Peri-Wound Care: Zinc Oxide Ointment 30g tube 1 x Per Week/30 Days Discharge Instructions: Apply Zinc Oxide to periwound with each dressing change Peri-Wound Care: Sween Lotion (Moisturizing lotion) 1 x Per Week/30 Days Discharge Instructions: Apply moisturizing lotion as directed Topical: Gentamicin 1 x Per Week/30 Days Discharge Instructions: As directed by physician Topical: Mupirocin Ointment 1 x Per Week/30 Days Discharge Instructions: Apply Mupirocin (Bactroban) as instructed Topical: Skintegrity Hydrogel 4 (oz) 1 x Per Week/30 Days Discharge Instructions: Apply hydrogel as directed Prim Dressing: Endoform 2x2 in 1 x Per Week/30 Days ary Discharge Instructions: Moisten with saline Secondary Dressing: Optifoam Non-Adhesive Dressing, 4x4 in 1 x Per Week/30 Days Discharge Instructions: Apply over primary dressing as directed. Secondary Dressing: Zetuvit Plus 4x8 in 1 x Per Week/30 Days Discharge Instructions: Apply over primary dressing as directed. Com pression Wrap: CoFlex [employer] Unna Boot 4 x 6 (in/yd) 1 x Per Week/30 Days Discharge Instructions: Apply Coflex Calamine [employer] as directed. 07/17/2023: The wound is smaller again today. Moisture balance is appropriate. There is a little bit of slough overlying granulation tissue. I used a curette to debride the slough from her wound. We will continue the mixture of topical gentamicin and mupirocin with endoform and Optifoam cover to maintain moisture balance. Continue calamine based Radio broadcast assistant. Follow-up in 1 week. Electronic Signature(s) Signed: 07/17/2023 4:10:34 PM By: Duanne Guess MD FACS Entered By: Duanne Guess on 07/17/2023  13:10:34 -------------------------------------------------------------------------------- HxROS Details Patient Name: Date of Service: [name], [name] 07/17/2023 2:30 PM Medical Record Number: [id number] Patient Account Number: [id number] Date of Birth/Sex: Treating RN: 1946-09-16 (77 y.o. F) Primary Care Provider: Eustaquio Boyden Other Clinician: Referring Provider: Treating Provider/Extender: Priscille Loveless in Treatment: 50 Information Obtained From Patient Eyes Medical History: Positive for: Cataracts - L eye Ear/Nose/Mouth/Throat Medical History: Negative for: Chronic sinus problems/congestion; Middle ear problems Hematologic/Lymphatic Medical History: Negative for: Anemia; Human Immunodeficiency Virus; Lymphedema Respiratory Medical History: Positive for: Asthma Past Medical History Notes: OSA on CPAP Cardiovascular [name], [name] ([id number]) [id number] Page 11 of 12 Medical History: Positive for: Hypertension Past Medical History Notes: Cronic venous insufficiency 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 Gastrointestinal Medical History: Past Medical History Notes: liver cyst Endocrine Medical History: Negative for: Type I Diabetes; Type II Diabetes Musculoskeletal Medical History: Past Medical History Notes: arthritis Neurologic Medical History: Positive for: Neuropathy - Feet and finger Negative for: Seizure Disorder Past Medical History Notes: restless leg syndrome Oncologic Medical History: Positive for: Received Chemotherapy - 2012; Received Radiation - 2012 HBO Extended History Items Eyes: Cataracts Immunizations Pneumococcal Vaccine: Received Pneumococcal Vaccination: Yes Received Pneumococcal Vaccination On or After 60th Birthday: Yes Implantable Devices No devices added Hospitalization / Surgery History Type of Hospitalization/Surgery Lower extremity venography  2020 Intravascular ultrasound peripheral vascular intervention melanoma 2011 Family and Social History Cancer: Yes - Mother,Paternal Grandparents; Diabetes: Yes - Siblings; Heart Disease: Yes - Father; Hypertension: Yes - Father; Former smoker - quit in 1967

## 2023-07-23 ENCOUNTER — Other Ambulatory Visit: Payer: Self-pay | Admitting: Family Medicine

## 2023-07-23 DIAGNOSIS — J4520 Mild intermittent asthma, uncomplicated: Secondary | ICD-10-CM

## 2023-07-24 ENCOUNTER — Encounter (HOSPITAL_BASED_OUTPATIENT_CLINIC_OR_DEPARTMENT_OTHER): Payer: Medicare Other | Admitting: General Surgery

## 2023-07-24 DIAGNOSIS — L97222 Non-pressure chronic ulcer of left calf with fat layer exposed: Secondary | ICD-10-CM | POA: Diagnosis not present

## 2023-07-24 DIAGNOSIS — I83893 Varicose veins of bilateral lower extremities with other complications: Secondary | ICD-10-CM | POA: Diagnosis not present

## 2023-07-24 DIAGNOSIS — I871 Compression of vein: Secondary | ICD-10-CM | POA: Diagnosis not present

## 2023-07-24 DIAGNOSIS — G629 Polyneuropathy, unspecified: Secondary | ICD-10-CM | POA: Diagnosis not present

## 2023-07-24 DIAGNOSIS — I872 Venous insufficiency (chronic) (peripheral): Secondary | ICD-10-CM | POA: Diagnosis not present

## 2023-07-24 DIAGNOSIS — R600 Localized edema: Secondary | ICD-10-CM | POA: Diagnosis not present

## 2023-07-24 DIAGNOSIS — I10 Essential (primary) hypertension: Secondary | ICD-10-CM | POA: Diagnosis not present

## 2023-07-24 DIAGNOSIS — L97822 Non-pressure chronic ulcer of other part of left lower leg with fat layer exposed: Secondary | ICD-10-CM | POA: Diagnosis not present

## 2023-07-25 NOTE — Progress Notes (Signed)
as directed. Zetuvit Plus 4x8 in Discharge Instruction: Apply over primary dressing as directed. Secured With Compression Wrap CoFlex Calamine Unna Boot 4 x 6 (in/yd) Discharge Instruction: Apply Coflex [employer]amine [employer] as directed. Compression Stockings Add-Ons Electronic Signature(s) Signed: 07/24/2023 3:52:32 PM By: Duanne Guess MD FACS Entered By: Duanne Guess on 07/24/2023 15:52:31 -------------------------------------------------------------------------------- Multi-Disciplinary Care Plan Details Patient Name: Date of Service: [name], [name] 07/24/2023 2:30 PM Medical Record Number: [id number] Patient Account Number: [id number] Date of Birth/Sex: Treating RN: 1946-07-01 (77 y.o. [name] Primary Care Darnell Jeschke: Eustaquio Boyden Other Clinician: Referring Gyasi Hazzard: Treating Maciej Schweitzer/Extender: Priscille Loveless in Treatment: 63 Multidisciplinary Care Plan reviewed with physician Active Inactive Venous Leg Ulcer Nursing Diagnoses: Knowledge deficit related to disease process and management Potential for venous Insuffiency (use before diagnosis confirmed) Goals: Patient will maintain optimal edema control Date Initiated: 02/07/2023 Target Resolution Date: 08/02/2023 Goal Status: Active Interventions: Assess peripheral edema status every visit. Compression as ordered Treatment Activities: Therapeutic compression applied : 02/07/2023 Notes: Wound/Skin Impairment Nursing Diagnoses: Impaired tissue integrity Goals: Ulcer/skin breakdown will have a volume reduction of 30% by week 4 Date Initiated: 08/03/2022 Date Inactivated: 09/10/2022 Target Resolution Date: 08/31/2022 Unmet Reason: uncontrolled edema, Goal Status: Unmet acute infection Ulcer/skin breakdown will have a volume reduction of 50% by week 8 Date Initiated: 09/10/2022 Target Resolution Date: 08/02/2023 [name], [name] ([id number]) [id number] Page 6 of 9 Goal Status: Active Interventions: Assess ulceration(s) every visit Treatment Activities: Skin care regimen initiated : 08/03/2022 Notes: [place] ordered. 10/30 RX Levo started. 09/10/22 Electronic Signature(s) Signed: 07/25/2023 4:20:28 PM By: Brenton Grills Entered By: Brenton Grills on 07/24/2023  15:00:22 -------------------------------------------------------------------------------- Pain Assessment Details Patient Name: Date of Service: [name] 07/24/2023 2:30 PM Medical Record Number: [id number] Patient Account Number: [id number] Date of Birth/Sex: Treating RN: 07/27/1946 (77 y.o. [name] Primary Care Keelan Tripodi: Eustaquio Boyden Other Clinician: Referring Esiah Bazinet: Treating Nekeya Briski/Extender: Priscille Loveless in Treatment: 75 Active Problems Location of Pain Severity and Description of Pain Patient Has Paino No Site Locations Pain Management and Medication Current Pain Management: Electronic Signature(s) Signed: 07/25/2023 4:20:28 PM By: Brenton Grills Entered By: Brenton Grills on 07/24/2023 14:47:48 -------------------------------------------------------------------------------- Patient/Caregiver Education Details Patient Name: Date of Service: [name] 9/18/2024andnbsp2:30 PM Medical Record Number: [id number] Patient Account Number: [id number] [name] ([id number]) [id number] Page 7 of 9 Date of Birth/Gender: Treating RN: 05-Jun-1946 (77 y.o. [name] Primary Care Physician: Eustaquio Boyden Other Clinician: Referring Physician: Treating Physician/Extender: Priscille Loveless in Treatment: 66 Education Assessment Education Provided To: Patient Education Topics Provided Wound/Skin Impairment: Methods: Explain/Verbal Responses: State content correctly Electronic Signature(s) Signed: 07/25/2023 4:20:28 PM By: Brenton Grills Entered By: Brenton Grills on 07/24/2023 15:00:47 -------------------------------------------------------------------------------- Wound Assessment Details Patient Name: Date of Service: [name] 07/24/2023 2:30 PM Medical Record Number: [id number] Patient Account Number: [id number] Date of Birth/Sex: Treating RN: [birth year]  (77 y.o. [name] Primary Care Dontavis Tschantz: Eustaquio Boyden Other Clinician: Referring Jabe Jeanbaptiste: Treating Jaleisa Brose/Extender: Katharina Caper Weeks in Treatment: 51 Wound Status Wound Number: 2 Primary Venous Leg Ulcer Etiology: Wound Location: Left, Posterior Lower Leg Wound Open Wounding Event: Blister Status: Date Acquired: 07/06/2022 Comorbid Cataracts, Asthma, Hypertension, Neuropathy, Received Weeks Of Treatment: 51 History: Chemotherapy, Received Radiation Clustered Wound: No Photos Wound Measurements Length: (cm) 3.8 Width: (cm) 1.3 Depth: (cm) 0.1 Area: (cm) 3.88 Volume: (cm) 0.388 % Reduction in Area: 43.5% % Reduction in Volume: 43.5% Epithelialization: Small (1-33%) Tunneling: No Undermining: No  cm Ankle Left: Right: Point of Measurement: 9 cm From Medial Instep 23.3 cm Vascular Assessment Pulses: Dorsalis Pedis Palpable: [Left:Yes] Extremity colors, hair growth, and conditions: Extremity Color: [Left:Normal] Hair Growth on Extremity: [Left:Yes] Temperature of Extremity: [Left:Warm] Capillary Refill: [Left:< 3 seconds] Dependent Rubor: [Left:No No] Toe Nail Assessment Left: Right: Thick: Yes Discolored: Yes Deformed: Yes Improper Length and Hygiene: Yes Electronic Signature(s) Signed: 07/25/2023 4:20:28 PM By: Brenton Grills Entered By: Brenton Grills on 07/24/2023 14:48:09 -------------------------------------------------------------------------------- Multi Wound Chart Details Patient Name: Date of Service: [name] 07/24/2023 2:30 PM Medical Record Number: [id number] Patient Account Number: [id number] Date of Birth/Sex: Treating RN: Jul 18, 1946 (77 y.o. F) Primary Care Konnor Jorden: Eustaquio Boyden Other  Clinician: Referring Nelda Luckey: Treating Kiyan Burmester/Extender: Priscille Loveless in Treatment: 51 Vital Signs Height(in): 63 Pulse(bpm): 71 Weight(lbs): 200 Blood Pressure(mmHg): 154/85 Body Mass Index(BMI): 35.4 Temperature(F): 97.9 Respiratory Rate(breaths/min): 18 [2:Photos:] [N/A:N/A] Left, Posterior Lower Leg N/A N/A Wound Location: Blister N/A N/A Wounding Event: Venous Leg Ulcer N/A N/A Primary Etiology: Cataracts, Asthma, Hypertension, N/A N/A Comorbid History: Neuropathy, Received Chemotherapy, Received Radiation [name], [name] ([id number]) [id number] Page 4 of 9 07/06/2022 N/A N/A Date Acquired: 54 N/A N/A Weeks of Treatment: Open N/A N/A Wound Status: No N/A N/A Wound Recurrence: 3.8x1.3x0.1 N/A N/A Measurements L x W x D (cm) 3.88 N/A N/A A (cm) : rea 0.388 N/A N/A Volume (cm) : 43.50% N/A N/A % Reduction in Area: 43.50% N/A N/A % Reduction in Volume: Full Thickness Without Exposed N/A N/A Classification: Support Structures Medium N/A N/A Exudate A mount: Serosanguineous N/A N/A Exudate Type: red, brown N/A N/A Exudate Color: Distinct, outline attached N/A N/A Wound Margin: Small (1-33%) N/A N/A Granulation A mount: Red N/A N/A Granulation Quality: Large (67-100%) N/A N/A Necrotic A mount: Eschar, Adherent Slough N/A N/A Necrotic Tissue: Fat Layer (Subcutaneous Tissue): Yes N/A N/A Exposed Structures: Fascia: No Tendon: No Muscle: No Joint: No Bone: No Small (1-33%) N/A N/A Epithelialization: Debridement - Selective/Open Wound N/A N/A Debridement: Pre-procedure Verification/Time Out 15:12 N/A N/A Taken: Lidocaine 4% Topical Solution N/A N/A Pain Control: Slough N/A N/A Tissue Debrided: Non-Viable Tissue N/A N/A Level: 3.88 N/A N/A Debridement A (sq cm): rea Curette N/A N/A Instrument: Minimum N/A N/A Bleeding: Pressure N/A N/A Hemostasis A chieved: Procedure was tolerated  well N/A N/A Debridement Treatment Response: 3.8x1.3x0.1 N/A N/A Post Debridement Measurements L x W x D (cm) 0.388 N/A N/A Post Debridement Volume: (cm) Scarring: Yes N/A N/A Periwound Skin Texture: Maceration: No N/A N/A Periwound Skin Moisture: Dry/Scaly: No Rubor: Yes N/A N/A Periwound Skin Color: No Abnormality N/A N/A Temperature: Yes N/A N/A Tenderness on Palpation: Compression Therapy N/A N/A Procedures Performed: Debridement Treatment Notes Wound #2 (Lower Leg) Wound Laterality: Left, Posterior Cleanser Soap and Water Discharge Instruction: May shower and wash wound with dial antibacterial soap and water prior to dressing change. Peri-Wound Care Ketoconazole Cream 2% Discharge Instruction: Apply Ketoconazole as directed Triamcinolone 15 (g) Discharge Instruction: Use triamcinolone 15 (g) as directed Zinc Oxide Ointment 30g tube Discharge Instruction: Apply Zinc Oxide to periwound with each dressing change Sween Lotion (Moisturizing lotion) Discharge Instruction: Apply moisturizing lotion as directed Topical Gentamicin Discharge Instruction: As directed by physician Mupirocin Ointment Discharge Instruction: Apply Mupirocin (Bactroban) as instructed Skintegrity Hydrogel 4 (oz) Discharge Instruction: Apply hydrogel as directed Primary Dressing Endoform 2x2 in Discharge Instruction: Moisten with saline [name], [name] ([id number]) [id number] Page 5 of 9 Secondary Dressing Optifoam Non-Adhesive Dressing, 4x4 in Discharge Instruction: Apply over primary dressing  Wound Description Classification: Full Thickness Without Exposed Support Structures Wound Margin: Distinct, outline attached Exudate Amount: Medium Exudate Type: Serosanguineous Exudate Color: red, brown [name], [name] ([id number]) Foul Odor After Cleansing: No Slough/Fibrino Yes [id number] Page 8 of 9 Wound Bed Granulation Amount: Small (1-33%) Exposed Structure Granulation Quality: Red Fascia Exposed: No Necrotic Amount: Large (67-100%) Fat Layer (Subcutaneous Tissue) Exposed: Yes Necrotic Quality: Eschar, Adherent Slough Tendon Exposed: No Muscle Exposed: No Joint Exposed: No Bone Exposed: No Periwound Skin Texture Texture Color No Abnormalities Noted: Yes No Abnormalities Noted: No Rubor: Yes Moisture No Abnormalities Noted: Yes Temperature / Pain Temperature: No Abnormality Tenderness on Palpation: Yes Treatment Notes Wound #2 (Lower Leg) Wound Laterality: Left, Posterior Cleanser Soap and Water Discharge Instruction: May shower and wash wound with dial antibacterial soap and water prior to dressing change. Peri-Wound Care Ketoconazole Cream 2% Discharge Instruction: Apply Ketoconazole as  directed Triamcinolone 15 (g) Discharge Instruction: Use triamcinolone 15 (g) as directed Zinc Oxide Ointment 30g tube Discharge Instruction: Apply Zinc Oxide to periwound with each dressing change Sween Lotion (Moisturizing lotion) Discharge Instruction: Apply moisturizing lotion as directed Topical Gentamicin Discharge Instruction: As directed by physician Mupirocin Ointment Discharge Instruction: Apply Mupirocin (Bactroban) as instructed Skintegrity Hydrogel 4 (oz) Discharge Instruction: Apply hydrogel as directed Primary Dressing Endoform 2x2 in Discharge Instruction: Moisten with saline Secondary Dressing Optifoam Non-Adhesive Dressing, 4x4 in Discharge Instruction: Apply over primary dressing as directed. Zetuvit Plus 4x8 in Discharge Instruction: Apply over primary dressing as directed. Secured With Compression Wrap CoFlex Calamine Unna Boot 4 x 6 (in/yd) Discharge Instruction: Apply Coflex Calamine [employer] as directed. Compression Stockings Add-Ons Electronic Signature(s) Signed: 07/25/2023 4:20:28 PM By: Brenton Grills Entered By: Brenton Grills on 07/24/2023 14:59:18 [name] ([id number]) [id number] Page 9 of 9 -------------------------------------------------------------------------------- Vitals Details Patient Name: Date of Service: [name], [name] 07/24/2023 2:30 PM Medical Record Number: [id number] Patient Account Number: [id number] Date of Birth/Sex: Treating RN: 06/12/1946 (77 y.o. [name] Primary Care Tiron Suski: Eustaquio Boyden Other Clinician: Referring Calder Oblinger: Treating Shed Nixon/Extender: Priscille Loveless in Treatment: 51 Vital Signs Time Taken: 14:47 Temperature (F): 97.9 Height (in): 63 Pulse (bpm): 71 Weight (lbs): 200 Respiratory Rate (breaths/min): 18 Body Mass Index (BMI): 35.4 Blood Pressure (mmHg): 154/85 Reference Range: 80 - 120 mg / dl Electronic  Signature(s) Signed: 07/25/2023 4:20:28 PM By: Brenton Grills Entered By: Brenton Grills on 07/24/2023 14:47:41  Wound Description Classification: Full Thickness Without Exposed Support Structures Wound Margin: Distinct, outline attached Exudate Amount: Medium Exudate Type: Serosanguineous Exudate Color: red, brown [name], [name] ([id number]) Foul Odor After Cleansing: No Slough/Fibrino Yes [id number] Page 8 of 9 Wound Bed Granulation Amount: Small (1-33%) Exposed Structure Granulation Quality: Red Fascia Exposed: No Necrotic Amount: Large (67-100%) Fat Layer (Subcutaneous Tissue) Exposed: Yes Necrotic Quality: Eschar, Adherent Slough Tendon Exposed: No Muscle Exposed: No Joint Exposed: No Bone Exposed: No Periwound Skin Texture Texture Color No Abnormalities Noted: Yes No Abnormalities Noted: No Rubor: Yes Moisture No Abnormalities Noted: Yes Temperature / Pain Temperature: No Abnormality Tenderness on Palpation: Yes Treatment Notes Wound #2 (Lower Leg) Wound Laterality: Left, Posterior Cleanser Soap and Water Discharge Instruction: May shower and wash wound with dial antibacterial soap and water prior to dressing change. Peri-Wound Care Ketoconazole Cream 2% Discharge Instruction: Apply Ketoconazole as  directed Triamcinolone 15 (g) Discharge Instruction: Use triamcinolone 15 (g) as directed Zinc Oxide Ointment 30g tube Discharge Instruction: Apply Zinc Oxide to periwound with each dressing change Sween Lotion (Moisturizing lotion) Discharge Instruction: Apply moisturizing lotion as directed Topical Gentamicin Discharge Instruction: As directed by physician Mupirocin Ointment Discharge Instruction: Apply Mupirocin (Bactroban) as instructed Skintegrity Hydrogel 4 (oz) Discharge Instruction: Apply hydrogel as directed Primary Dressing Endoform 2x2 in Discharge Instruction: Moisten with saline Secondary Dressing Optifoam Non-Adhesive Dressing, 4x4 in Discharge Instruction: Apply over primary dressing as directed. Zetuvit Plus 4x8 in Discharge Instruction: Apply over primary dressing as directed. Secured With Compression Wrap CoFlex Calamine Unna Boot 4 x 6 (in/yd) Discharge Instruction: Apply Coflex Calamine [employer] as directed. Compression Stockings Add-Ons Electronic Signature(s) Signed: 07/25/2023 4:20:28 PM By: Brenton Grills Entered By: Brenton Grills on 07/24/2023 14:59:18 [name] ([id number]) [id number] Page 9 of 9 -------------------------------------------------------------------------------- Vitals Details Patient Name: Date of Service: [name], [name] 07/24/2023 2:30 PM Medical Record Number: [id number] Patient Account Number: [id number] Date of Birth/Sex: Treating RN: 06/12/1946 (77 y.o. [name] Primary Care Tiron Suski: Eustaquio Boyden Other Clinician: Referring Calder Oblinger: Treating Shed Nixon/Extender: Priscille Loveless in Treatment: 51 Vital Signs Time Taken: 14:47 Temperature (F): 97.9 Height (in): 63 Pulse (bpm): 71 Weight (lbs): 200 Respiratory Rate (breaths/min): 18 Body Mass Index (BMI): 35.4 Blood Pressure (mmHg): 154/85 Reference Range: 80 - 120 mg / dl Electronic  Signature(s) Signed: 07/25/2023 4:20:28 PM By: Brenton Grills Entered By: Brenton Grills on 07/24/2023 14:47:41

## 2023-07-25 NOTE — Progress Notes (Signed)
[name], [name] ([id number]) [id number] Page 1 of 12 Visit Report for 07/24/2023 Chief Complaint Document Details Patient Name: Date of Service: [name], [name] 07/24/2023 2:30 PM Medical Record Number: [id number] Patient Account Number: [id number] Date of Birth/Sex: Treating RN: 10/28/1946 (77 y.o. F) Primary Care Provider: Eustaquio Boyden Other Clinician: Referring Provider: Treating Provider/Extender: Priscille Loveless in Treatment: 43 Information Obtained from: Patient Chief Complaint Patient presents for treatment of an open ulcer due to venous insufficiency Electronic Signature(s) Signed: 07/24/2023 3:52:43 PM By: Duanne Guess MD FACS Entered By: Duanne Guess on 07/24/2023 15:52:43 -------------------------------------------------------------------------------- Debridement Details Patient Name: Date of Service: [name] 07/24/2023 2:30 PM Medical Record Number: [id number] Patient Account Number: [id number] Date of Birth/Sex: Treating RN: [birth year] (77 y.o. [name] Primary Care Provider: Eustaquio Boyden Other Clinician: Referring Provider: Treating Provider/Extender: Priscille Loveless in Treatment: 51 Debridement Performed for Assessment: Wound #2 Left,Posterior Lower Leg Performed By: Physician Duanne Guess, MD The following information was scribed by: Brenton Grills The information was scribed for: Duanne Guess Debridement Type: Debridement Severity of Tissue Pre Debridement: Fat layer exposed Level of Consciousness (Pre-procedure): Awake and Alert Pre-procedure Verification/Time Out Yes - 15:12 Taken: Start Time: 15:14 Pain Control: Lidocaine 4% T opical Solution Percent of Wound Bed Debrided: 100% T Area Debrided (cm): otal 3.88 Tissue and other material debrided: Non-Viable, Slough, Slough Level: Non-Viable Tissue Debridement Description: Selective/Open  Wound Instrument: Curette Bleeding: Minimum Hemostasis Achieved: Pressure Response to Treatment: Procedure was tolerated well Level of Consciousness (Post- Awake and Alert procedure): Post Debridement Measurements of Total Wound Length: (cm) 3.8 Width: (cm) 1.3 Depth: (cm) 0.1 Volume: (cm) 0.388 Character of Wound/Ulcer Post Debridement: Improved Severity of Tissue Post Debridement: Fat layer exposed [name], [name] ([id number]) [id number] Page 2 of 12 Post Procedure Diagnosis Same as Pre-procedure Electronic Signature(s) Signed: 07/24/2023 4:02:03 PM By: Duanne Guess MD FACS Signed: 07/25/2023 4:20:28 PM By: Brenton Grills Entered By: Brenton Grills on 07/24/2023 15:13:38 -------------------------------------------------------------------------------- HPI Details Patient Name: Date of Service: [name], [name] 07/24/2023 2:30 PM Medical Record Number: [id number] Patient Account Number: [id number] Date of Birth/Sex: Treating RN: 05-[name]-1946 (77 y.o. F) Primary Care Provider: Eustaquio Boyden Other Clinician: Referring Provider: Treating Provider/Extender: Priscille Loveless in Treatment: 67 History of Present Illness HPI Description: ADMISSION This is a 77 year old woman with a history of chronic venous insufficiency status post saphenous vein ablations in 2010 and 2016. 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 She presents today with an open venous ulcer on her left lower leg. It has been present for a little over 2 weeks. She saw her primary care provider who apparently swabbed the wound and grew out Pseudomonas. She just completed a course of ciprofloxacin for this. ABI was 0.91. She reports that she has had previous issues with ulcers in this same location, stemming back to a punch biopsy taken by a dermatologist many years ago. She has had several skin substitutes applied to the area that have  ultimately resulted in healing on prior occasions. She has 2 small ulcers on her left medial lower leg. There is slough accumulation in both of them. The more anterior of the 2 is quite small and has some soft slough on the surface, underneath which there is good granulation tissue. The more medial wound also has slough accumulation, but the underlying surface is fairly fibrotic and gritty. This is consistent with her provided history of multiple ulcers in the same location. 08/03/2022:  [name], [name] ([id number]) [id number] Page 1 of 12 Visit Report for 07/24/2023 Chief Complaint Document Details Patient Name: Date of Service: [name], [name] 07/24/2023 2:30 PM Medical Record Number: [id number] Patient Account Number: [id number] Date of Birth/Sex: Treating RN: 10/28/1946 (77 y.o. F) Primary Care Provider: Eustaquio Boyden Other Clinician: Referring Provider: Treating Provider/Extender: Priscille Loveless in Treatment: 43 Information Obtained from: Patient Chief Complaint Patient presents for treatment of an open ulcer due to venous insufficiency Electronic Signature(s) Signed: 07/24/2023 3:52:43 PM By: Duanne Guess MD FACS Entered By: Duanne Guess on 07/24/2023 15:52:43 -------------------------------------------------------------------------------- Debridement Details Patient Name: Date of Service: [name] 07/24/2023 2:30 PM Medical Record Number: [id number] Patient Account Number: [id number] Date of Birth/Sex: Treating RN: [birth year] (77 y.o. [name] Primary Care Provider: Eustaquio Boyden Other Clinician: Referring Provider: Treating Provider/Extender: Priscille Loveless in Treatment: 51 Debridement Performed for Assessment: Wound #2 Left,Posterior Lower Leg Performed By: Physician Duanne Guess, MD The following information was scribed by: Brenton Grills The information was scribed for: Duanne Guess Debridement Type: Debridement Severity of Tissue Pre Debridement: Fat layer exposed Level of Consciousness (Pre-procedure): Awake and Alert Pre-procedure Verification/Time Out Yes - 15:12 Taken: Start Time: 15:14 Pain Control: Lidocaine 4% T opical Solution Percent of Wound Bed Debrided: 100% T Area Debrided (cm): otal 3.88 Tissue and other material debrided: Non-Viable, Slough, Slough Level: Non-Viable Tissue Debridement Description: Selective/Open  Wound Instrument: Curette Bleeding: Minimum Hemostasis Achieved: Pressure Response to Treatment: Procedure was tolerated well Level of Consciousness (Post- Awake and Alert procedure): Post Debridement Measurements of Total Wound Length: (cm) 3.8 Width: (cm) 1.3 Depth: (cm) 0.1 Volume: (cm) 0.388 Character of Wound/Ulcer Post Debridement: Improved Severity of Tissue Post Debridement: Fat layer exposed [name], [name] ([id number]) [id number] Page 2 of 12 Post Procedure Diagnosis Same as Pre-procedure Electronic Signature(s) Signed: 07/24/2023 4:02:03 PM By: Duanne Guess MD FACS Signed: 07/25/2023 4:20:28 PM By: Brenton Grills Entered By: Brenton Grills on 07/24/2023 15:13:38 -------------------------------------------------------------------------------- HPI Details Patient Name: Date of Service: [name], [name] 07/24/2023 2:30 PM Medical Record Number: [id number] Patient Account Number: [id number] Date of Birth/Sex: Treating RN: 05-[name]-1946 (77 y.o. F) Primary Care Provider: Eustaquio Boyden Other Clinician: Referring Provider: Treating Provider/Extender: Priscille Loveless in Treatment: 67 History of Present Illness HPI Description: ADMISSION This is a 77 year old woman with a history of chronic venous insufficiency status post saphenous vein ablations in 2010 and 2016. 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 She presents today with an open venous ulcer on her left lower leg. It has been present for a little over 2 weeks. She saw her primary care provider who apparently swabbed the wound and grew out Pseudomonas. She just completed a course of ciprofloxacin for this. ABI was 0.91. She reports that she has had previous issues with ulcers in this same location, stemming back to a punch biopsy taken by a dermatologist many years ago. She has had several skin substitutes applied to the area that have  ultimately resulted in healing on prior occasions. She has 2 small ulcers on her left medial lower leg. There is slough accumulation in both of them. The more anterior of the 2 is quite small and has some soft slough on the surface, underneath which there is good granulation tissue. The more medial wound also has slough accumulation, but the underlying surface is fairly fibrotic and gritty. This is consistent with her provided history of multiple ulcers in the same location. 08/03/2022:  [name], [name] ([id number]) [id number] Page 1 of 12 Visit Report for 07/24/2023 Chief Complaint Document Details Patient Name: Date of Service: [name], [name] 07/24/2023 2:30 PM Medical Record Number: [id number] Patient Account Number: [id number] Date of Birth/Sex: Treating RN: 10/28/1946 (77 y.o. F) Primary Care Provider: Eustaquio Boyden Other Clinician: Referring Provider: Treating Provider/Extender: Priscille Loveless in Treatment: 43 Information Obtained from: Patient Chief Complaint Patient presents for treatment of an open ulcer due to venous insufficiency Electronic Signature(s) Signed: 07/24/2023 3:52:43 PM By: Duanne Guess MD FACS Entered By: Duanne Guess on 07/24/2023 15:52:43 -------------------------------------------------------------------------------- Debridement Details Patient Name: Date of Service: [name] 07/24/2023 2:30 PM Medical Record Number: [id number] Patient Account Number: [id number] Date of Birth/Sex: Treating RN: [birth year] (77 y.o. [name] Primary Care Provider: Eustaquio Boyden Other Clinician: Referring Provider: Treating Provider/Extender: Priscille Loveless in Treatment: 51 Debridement Performed for Assessment: Wound #2 Left,Posterior Lower Leg Performed By: Physician Duanne Guess, MD The following information was scribed by: Brenton Grills The information was scribed for: Duanne Guess Debridement Type: Debridement Severity of Tissue Pre Debridement: Fat layer exposed Level of Consciousness (Pre-procedure): Awake and Alert Pre-procedure Verification/Time Out Yes - 15:12 Taken: Start Time: 15:14 Pain Control: Lidocaine 4% T opical Solution Percent of Wound Bed Debrided: 100% T Area Debrided (cm): otal 3.88 Tissue and other material debrided: Non-Viable, Slough, Slough Level: Non-Viable Tissue Debridement Description: Selective/Open  Wound Instrument: Curette Bleeding: Minimum Hemostasis Achieved: Pressure Response to Treatment: Procedure was tolerated well Level of Consciousness (Post- Awake and Alert procedure): Post Debridement Measurements of Total Wound Length: (cm) 3.8 Width: (cm) 1.3 Depth: (cm) 0.1 Volume: (cm) 0.388 Character of Wound/Ulcer Post Debridement: Improved Severity of Tissue Post Debridement: Fat layer exposed [name], [name] ([id number]) [id number] Page 2 of 12 Post Procedure Diagnosis Same as Pre-procedure Electronic Signature(s) Signed: 07/24/2023 4:02:03 PM By: Duanne Guess MD FACS Signed: 07/25/2023 4:20:28 PM By: Brenton Grills Entered By: Brenton Grills on 07/24/2023 15:13:38 -------------------------------------------------------------------------------- HPI Details Patient Name: Date of Service: [name], [name] 07/24/2023 2:30 PM Medical Record Number: [id number] Patient Account Number: [id number] Date of Birth/Sex: Treating RN: 05-[name]-1946 (77 y.o. F) Primary Care Provider: Eustaquio Boyden Other Clinician: Referring Provider: Treating Provider/Extender: Priscille Loveless in Treatment: 67 History of Present Illness HPI Description: ADMISSION This is a 77 year old woman with a history of chronic venous insufficiency status post saphenous vein ablations in 2010 and 2016. 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 She presents today with an open venous ulcer on her left lower leg. It has been present for a little over 2 weeks. She saw her primary care provider who apparently swabbed the wound and grew out Pseudomonas. She just completed a course of ciprofloxacin for this. ABI was 0.91. She reports that she has had previous issues with ulcers in this same location, stemming back to a punch biopsy taken by a dermatologist many years ago. She has had several skin substitutes applied to the area that have  ultimately resulted in healing on prior occasions. She has 2 small ulcers on her left medial lower leg. There is slough accumulation in both of them. The more anterior of the 2 is quite small and has some soft slough on the surface, underneath which there is good granulation tissue. The more medial wound also has slough accumulation, but the underlying surface is fairly fibrotic and gritty. This is consistent with her provided history of multiple ulcers in the same location. 08/03/2022:  [name], [name] ([id number]) [id number] Page 1 of 12 Visit Report for 07/24/2023 Chief Complaint Document Details Patient Name: Date of Service: [name], [name] 07/24/2023 2:30 PM Medical Record Number: [id number] Patient Account Number: [id number] Date of Birth/Sex: Treating RN: 10/28/1946 (77 y.o. F) Primary Care Provider: Eustaquio Boyden Other Clinician: Referring Provider: Treating Provider/Extender: Priscille Loveless in Treatment: 43 Information Obtained from: Patient Chief Complaint Patient presents for treatment of an open ulcer due to venous insufficiency Electronic Signature(s) Signed: 07/24/2023 3:52:43 PM By: Duanne Guess MD FACS Entered By: Duanne Guess on 07/24/2023 15:52:43 -------------------------------------------------------------------------------- Debridement Details Patient Name: Date of Service: [name] 07/24/2023 2:30 PM Medical Record Number: [id number] Patient Account Number: [id number] Date of Birth/Sex: Treating RN: [birth year] (77 y.o. [name] Primary Care Provider: Eustaquio Boyden Other Clinician: Referring Provider: Treating Provider/Extender: Priscille Loveless in Treatment: 51 Debridement Performed for Assessment: Wound #2 Left,Posterior Lower Leg Performed By: Physician Duanne Guess, MD The following information was scribed by: Brenton Grills The information was scribed for: Duanne Guess Debridement Type: Debridement Severity of Tissue Pre Debridement: Fat layer exposed Level of Consciousness (Pre-procedure): Awake and Alert Pre-procedure Verification/Time Out Yes - 15:12 Taken: Start Time: 15:14 Pain Control: Lidocaine 4% T opical Solution Percent of Wound Bed Debrided: 100% T Area Debrided (cm): otal 3.88 Tissue and other material debrided: Non-Viable, Slough, Slough Level: Non-Viable Tissue Debridement Description: Selective/Open  Wound Instrument: Curette Bleeding: Minimum Hemostasis Achieved: Pressure Response to Treatment: Procedure was tolerated well Level of Consciousness (Post- Awake and Alert procedure): Post Debridement Measurements of Total Wound Length: (cm) 3.8 Width: (cm) 1.3 Depth: (cm) 0.1 Volume: (cm) 0.388 Character of Wound/Ulcer Post Debridement: Improved Severity of Tissue Post Debridement: Fat layer exposed [name], [name] ([id number]) [id number] Page 2 of 12 Post Procedure Diagnosis Same as Pre-procedure Electronic Signature(s) Signed: 07/24/2023 4:02:03 PM By: Duanne Guess MD FACS Signed: 07/25/2023 4:20:28 PM By: Brenton Grills Entered By: Brenton Grills on 07/24/2023 15:13:38 -------------------------------------------------------------------------------- HPI Details Patient Name: Date of Service: [name], [name] 07/24/2023 2:30 PM Medical Record Number: [id number] Patient Account Number: [id number] Date of Birth/Sex: Treating RN: 05-[name]-1946 (77 y.o. F) Primary Care Provider: Eustaquio Boyden Other Clinician: Referring Provider: Treating Provider/Extender: Priscille Loveless in Treatment: 67 History of Present Illness HPI Description: ADMISSION This is a 77 year old woman with a history of chronic venous insufficiency status post saphenous vein ablations in 2010 and 2016. 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 She presents today with an open venous ulcer on her left lower leg. It has been present for a little over 2 weeks. She saw her primary care provider who apparently swabbed the wound and grew out Pseudomonas. She just completed a course of ciprofloxacin for this. ABI was 0.91. She reports that she has had previous issues with ulcers in this same location, stemming back to a punch biopsy taken by a dermatologist many years ago. She has had several skin substitutes applied to the area that have  ultimately resulted in healing on prior occasions. She has 2 small ulcers on her left medial lower leg. There is slough accumulation in both of them. The more anterior of the 2 is quite small and has some soft slough on the surface, underneath which there is good granulation tissue. The more medial wound also has slough accumulation, but the underlying surface is fairly fibrotic and gritty. This is consistent with her provided history of multiple ulcers in the same location. 08/03/2022:  Promogran Prisma Matrix, 4.34 (sq in) (silver collagen) 1 x  Per Week/30 Days ary Discharge Instructions: Moisten collagen with saline or hydrogel Secondary Dressing: Optifoam Non-Adhesive Dressing, 4x4 in 1 x Per Week/30 Days Discharge Instructions: Apply over primary dressing as directed. Secondary Dressing: Zetuvit Plus 4x8 in 1 x Per Week/30 Days Discharge Instructions: Apply over primary dressing as directed. Compression Wrap: CoFlex Calamine Unna Boot 4 x 6 (in/yd) 1 x Per Week/30 Days Discharge Instructions: Apply Coflex Calamine [employer] as directed. Electronic Signature(s) Signed: 07/24/2023 4:02:03 PM By: Duanne Guess MD FACS Entered By: Duanne Guess on 07/24/2023 15:54:59 -------------------------------------------------------------------------------- Problem List Details Patient Name: Date of Service: [name], [name] 07/24/2023 2:30 PM Medical Record Number: [id number] Patient Account Number: [id number] Date of Birth/Sex: Treating RN: [birth year] (77 y.o. [name] Primary Care Provider: Eustaquio Boyden Other Clinician: Referring Provider: Treating Provider/Extender: Priscille Loveless in Treatment: [address], [place] ([id number]) [id number] Page 6 of 12 Active Problems ICD-10 Encounter Code Description Active Date MDM Diagnosis L97.822 Non-pressure chronic ulcer of other part of left lower leg with fat layer exposed 07/27/2022 No Yes I83.893 Varicose veins of bilateral lower extremities with other complications 07/27/2022 No Yes I87.2 Venous insufficiency (chronic) (peripheral) 07/27/2022 No Yes G62.9 Polyneuropathy, unspecified 07/27/2022 No Yes R60.0 Localized edema 07/27/2022 No Yes Inactive Problems ICD-10 Code Description Active Date Inactive Date L03.115 Cellulitis of right lower limb 10/22/2022 10/22/2022 L97.818 Non-pressure chronic ulcer of other part of right lower leg with other specified severity 03/01/2023 10/22/2022 Resolved Problems Electronic  Signature(s) Signed: 07/24/2023 3:52:22 PM By: Duanne Guess MD FACS Entered By: Duanne Guess on 07/24/2023 15:52:22 -------------------------------------------------------------------------------- Progress Note Details Patient Name: Date of Service: [name] 07/24/2023 2:30 PM Medical Record Number: [id number] Patient Account Number: [id number] Date of Birth/Sex: Treating RN: 21-Sep-1946 (77 y.o. F) Primary Care Provider: Eustaquio Boyden Other Clinician: Referring Provider: Treating Provider/Extender: Priscille Loveless in Treatment: 65 Subjective Chief Complaint Information obtained from Patient Patient presents for treatment of an open ulcer due to venous insufficiency History of Present Illness (HPI) ADMISSION This is a 77 year old woman with a history of chronic venous insufficiency status post saphenous vein ablations in 2010 and 2016. 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 She presents today with an open venous ulcer on her left lower leg. It has been present for a little over 2 weeks. She saw her primary care provider who apparently swabbed the wound and grew out Pseudomonas. She just completed a course of ciprofloxacin for this. ABI was 0.91. She reports that she has had previous issues with ulcers in this same location, stemming back to a punch biopsy taken by a dermatologist many years ago. She has had several skin substitutes applied to the area that have ultimately resulted in healing on prior occasions. [name], [name] ([id number]) [id number] Page 7 of 12 She has 2 small ulcers on her left medial lower leg. There is slough accumulation in both of them. The more anterior of the 2 is quite small and has some soft slough on the surface, underneath which there is good granulation tissue. The more medial wound also has slough accumulation, but the underlying surface is fairly fibrotic and  gritty. This is consistent with her provided history of multiple ulcers in the same location. 08/03/2022: The anterior wound is smaller today with just a little bit of slough accumulation. The more medial wound continues to be very sensitive and fibrotic with slough buildup. 08/13/2022:  Wound #2 is a Venous Leg Ulcer located on the Left,Posterior Lower Leg . There was a Three Layer Compression Therapy Procedure by Brenton Grills, RN. Post procedure Diagnosis Wound #2: Same as Pre-Procedure There was a Three Layer Compression Therapy Procedure by Brenton Grills, RN. Post procedure Diagnosis Wound #: Same as Pre-Procedure Plan Follow-up  Appointments: Return Appointment in 1 week. - Dr. Lady Gary Room 3 Anesthetic: [name], [name] ([id number]) [id number] Page 10 of 12 Wound #2 Left,Posterior Lower Leg: (In clinic) Topical Lidocaine 4% applied to wound bed Bathing/ Shower/ Hygiene: May shower with protection but do not get wound dressing(s) wet. Protect dressing(s) with water repellant cover (for example, large plastic bag) or a cast cover and may then take shower. Edema Control - Lymphedema / SCD / Other: Lymphedema Pumps. Use Lymphedema pumps on leg(s) 2-3 times a day for 45-60 minutes. If wearing any wraps or hose, do not remove them. Continue exercising as instructed. - Use x1 per day Avoid standing for long periods of time. Exercise regularly Off-Loading: Wound #2 Left,Posterior Lower Leg: Other: - Elevate legs at heart level or above heart level while sitting. WOUND #2: - Lower Leg Wound Laterality: Left, Posterior Cleanser: Soap and Water 1 x Per Week/30 Days Discharge Instructions: May shower and wash wound with dial antibacterial soap and water prior to dressing change. Peri-Wound Care: Ketoconazole Cream 2% 1 x Per Week/30 Days Discharge Instructions: Apply Ketoconazole as directed Peri-Wound Care: Triamcinolone 15 (g) 1 x Per Week/30 Days Discharge Instructions: Use triamcinolone 15 (g) as directed Peri-Wound Care: Zinc Oxide Ointment 30g tube 1 x Per Week/30 Days Discharge Instructions: Apply Zinc Oxide to periwound with each dressing change Peri-Wound Care: Sween Lotion (Moisturizing lotion) 1 x Per Week/30 Days Discharge Instructions: Apply moisturizing lotion as directed Topical: Gentamicin 1 x Per Week/30 Days Discharge Instructions: As directed by physician Topical: Mupirocin Ointment 1 x Per Week/30 Days Discharge Instructions: Apply Mupirocin (Bactroban) as instructed Topical: Skintegrity Hydrogel 4 (oz) 1 x Per Week/30 Days Discharge Instructions: Apply hydrogel as  directed Prim Dressing: Promogran Prisma Matrix, 4.34 (sq in) (silver collagen) 1 x Per Week/30 Days ary Discharge Instructions: Moisten collagen with saline or hydrogel Secondary Dressing: Optifoam Non-Adhesive Dressing, 4x4 in 1 x Per Week/30 Days Discharge Instructions: Apply over primary dressing as directed. Secondary Dressing: Zetuvit Plus 4x8 in 1 x Per Week/30 Days Discharge Instructions: Apply over primary dressing as directed. Com pression Wrap: CoFlex Calamine Unna Boot 4 x 6 (in/yd) 1 x Per Week/30 Days Discharge Instructions: Apply Coflex Calamine [employer] as directed. 07/24/2023: The band of epithelium between the 2 open areas has gotten wider. The more proximal area has more epithelialization around the perimeter. Both have slough on the surface and remain fairly fibrotic. Moisture balance remains good. I used a curette to debride slough from each of the open wound surfaces. We will continue the mixture of topical gentamicin and mupirocin. We are currently out of endoform in the clinic so we will substitute with Prisma silver collagen this week. Continue additional hydrogel and an Optifoam cover to maintain moisture balance. Continue calamine based Unna boot wrap. Follow-up in 1 week. Electronic Signature(s) Signed: 07/24/2023 3:56:01 PM By: Duanne Guess MD FACS Entered By: Duanne Guess on 07/24/2023 15:56:01 -------------------------------------------------------------------------------- HxROS Details Patient Name: Date of Service: [name], [name] 07/24/2023 2:30 PM Medical Record Number: [id number] Patient Account Number: [id number] Date of Birth/Sex: Treating RN: 1945-11-29 (77 y.o. F) Primary Care Provider: Eustaquio Boyden Other Clinician: Referring Provider: Treating Provider/Extender: Lady Gary  Wound #2 is a Venous Leg Ulcer located on the Left,Posterior Lower Leg . There was a Three Layer Compression Therapy Procedure by Brenton Grills, RN. Post procedure Diagnosis Wound #2: Same as Pre-Procedure There was a Three Layer Compression Therapy Procedure by Brenton Grills, RN. Post procedure Diagnosis Wound #: Same as Pre-Procedure Plan Follow-up  Appointments: Return Appointment in 1 week. - Dr. Lady Gary Room 3 Anesthetic: [name], [name] ([id number]) [id number] Page 10 of 12 Wound #2 Left,Posterior Lower Leg: (In clinic) Topical Lidocaine 4% applied to wound bed Bathing/ Shower/ Hygiene: May shower with protection but do not get wound dressing(s) wet. Protect dressing(s) with water repellant cover (for example, large plastic bag) or a cast cover and may then take shower. Edema Control - Lymphedema / SCD / Other: Lymphedema Pumps. Use Lymphedema pumps on leg(s) 2-3 times a day for 45-60 minutes. If wearing any wraps or hose, do not remove them. Continue exercising as instructed. - Use x1 per day Avoid standing for long periods of time. Exercise regularly Off-Loading: Wound #2 Left,Posterior Lower Leg: Other: - Elevate legs at heart level or above heart level while sitting. WOUND #2: - Lower Leg Wound Laterality: Left, Posterior Cleanser: Soap and Water 1 x Per Week/30 Days Discharge Instructions: May shower and wash wound with dial antibacterial soap and water prior to dressing change. Peri-Wound Care: Ketoconazole Cream 2% 1 x Per Week/30 Days Discharge Instructions: Apply Ketoconazole as directed Peri-Wound Care: Triamcinolone 15 (g) 1 x Per Week/30 Days Discharge Instructions: Use triamcinolone 15 (g) as directed Peri-Wound Care: Zinc Oxide Ointment 30g tube 1 x Per Week/30 Days Discharge Instructions: Apply Zinc Oxide to periwound with each dressing change Peri-Wound Care: Sween Lotion (Moisturizing lotion) 1 x Per Week/30 Days Discharge Instructions: Apply moisturizing lotion as directed Topical: Gentamicin 1 x Per Week/30 Days Discharge Instructions: As directed by physician Topical: Mupirocin Ointment 1 x Per Week/30 Days Discharge Instructions: Apply Mupirocin (Bactroban) as instructed Topical: Skintegrity Hydrogel 4 (oz) 1 x Per Week/30 Days Discharge Instructions: Apply hydrogel as  directed Prim Dressing: Promogran Prisma Matrix, 4.34 (sq in) (silver collagen) 1 x Per Week/30 Days ary Discharge Instructions: Moisten collagen with saline or hydrogel Secondary Dressing: Optifoam Non-Adhesive Dressing, 4x4 in 1 x Per Week/30 Days Discharge Instructions: Apply over primary dressing as directed. Secondary Dressing: Zetuvit Plus 4x8 in 1 x Per Week/30 Days Discharge Instructions: Apply over primary dressing as directed. Com pression Wrap: CoFlex Calamine Unna Boot 4 x 6 (in/yd) 1 x Per Week/30 Days Discharge Instructions: Apply Coflex Calamine [employer] as directed. 07/24/2023: The band of epithelium between the 2 open areas has gotten wider. The more proximal area has more epithelialization around the perimeter. Both have slough on the surface and remain fairly fibrotic. Moisture balance remains good. I used a curette to debride slough from each of the open wound surfaces. We will continue the mixture of topical gentamicin and mupirocin. We are currently out of endoform in the clinic so we will substitute with Prisma silver collagen this week. Continue additional hydrogel and an Optifoam cover to maintain moisture balance. Continue calamine based Unna boot wrap. Follow-up in 1 week. Electronic Signature(s) Signed: 07/24/2023 3:56:01 PM By: Duanne Guess MD FACS Entered By: Duanne Guess on 07/24/2023 15:56:01 -------------------------------------------------------------------------------- HxROS Details Patient Name: Date of Service: [name], [name] 07/24/2023 2:30 PM Medical Record Number: [id number] Patient Account Number: [id number] Date of Birth/Sex: Treating RN: 1945-11-29 (77 y.o. F) Primary Care Provider: Eustaquio Boyden Other Clinician: Referring Provider: Treating Provider/Extender: Lady Gary  [name], [name] Weeks in Treatment: 51 Information Obtained From Patient Eyes Medical History: Positive for: Cataracts - L  eye Ear/Nose/Mouth/Throat Medical History: Negative for: Chronic sinus problems/congestion; Middle ear problems [name], [name] ([id number]) [id number] Page 11 of 12 Hematologic/Lymphatic Medical History: Negative for: Anemia; Human Immunodeficiency Virus; Lymphedema Respiratory Medical History: Positive for: Asthma Past Medical History Notes: OSA on CPAP Cardiovascular Medical History: Positive for: Hypertension Past Medical History Notes: Cronic venous insufficiency 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 Gastrointestinal Medical History: Past Medical History Notes: liver cyst Endocrine Medical History: Negative for: Type I Diabetes; Type II Diabetes Musculoskeletal Medical History: Past Medical History Notes: arthritis Neurologic Medical History: Positive for: Neuropathy - Feet and finger Negative for: Seizure Disorder Past Medical History Notes: restless leg syndrome Oncologic Medical History: Positive for: Received Chemotherapy - 2012; Received Radiation - 2012 HBO Extended History Items Eyes: Cataracts Immunizations Pneumococcal Vaccine: Received Pneumococcal Vaccination: Yes Received Pneumococcal Vaccination On or After 60th Birthday: Yes Implantable Devices No devices added Hospitalization / Surgery History Type of Hospitalization/Surgery Lower extremity venography 2020 Intravascular ultrasound peripheral vascular intervention melanoma 2011 Family and Social History Cancer: Yes - Mother,Paternal Grandparents; Diabetes: Yes - Siblings; Heart Disease: Yes - Father; Hypertension: Yes - Father; Former smoker - quit in [employer]) KENIESHA, CRAVENER ([id number]) [id number] Page 12 of 12 Signed: 07/24/2023 4:02:03 PM By: Duanne Guess MD FACS Entered By: Duanne Guess on 07/24/2023  15:54:13 -------------------------------------------------------------------------------- SuperBill Details Patient Name: Date of Service: [name] 07/24/2023 Medical Record Number: [id number] Patient Account Number: [id number] Date of Birth/Sex: Treating RN: 16-Feb-1946 (77 y.o. F) Primary Care Provider: Eustaquio Boyden Other Clinician: Referring Provider: Treating Provider/Extender: Priscille Loveless in Treatment: 51 Diagnosis Coding ICD-10 Codes Code Description [phone] Non-pressure chronic ulcer of other part of left lower leg with fat layer exposed I83.893 Varicose veins of bilateral lower extremities with other complications I87.2 Venous insufficiency (chronic) (peripheral) G62.9 Polyneuropathy, unspecified R60.0 Localized edema Facility Procedures : CPT4 Code: 41324401 Description: 97597 - DEBRIDE WOUND 1ST 20 SQ CM OR < ICD-10 Diagnosis Description L97.822 Non-pressure chronic ulcer of other part of left lower leg with fat layer expose Modifier: d Quantity: 1 Physician Procedures : CPT4 Code Description Modifier [id number] 99214 - WC PHYS LEVEL 4 - EST PT 25 ICD-10 Diagnosis Description L97.822 Non-pressure chronic ulcer of other part of left lower leg with fat layer exposed I83.893 Varicose veins of bilateral lower extremities with  other complications I87.2 Venous insufficiency (chronic) (peripheral) R60.0 Localized edema Quantity: 1 : [id number] 97597 - WC PHYS DEBR WO ANESTH 20 SQ CM ICD-10 Diagnosis Description L97.822 Non-pressure chronic ulcer of other part of left lower leg with fat layer exposed Quantity: 1 Electronic Signature(s) Signed: 07/24/2023 3:56:22 PM By: Duanne Guess MD FACS Entered By: Duanne Guess on 07/24/2023 15:56:22  Promogran Prisma Matrix, 4.34 (sq in) (silver collagen) 1 x  Per Week/30 Days ary Discharge Instructions: Moisten collagen with saline or hydrogel Secondary Dressing: Optifoam Non-Adhesive Dressing, 4x4 in 1 x Per Week/30 Days Discharge Instructions: Apply over primary dressing as directed. Secondary Dressing: Zetuvit Plus 4x8 in 1 x Per Week/30 Days Discharge Instructions: Apply over primary dressing as directed. Compression Wrap: CoFlex Calamine Unna Boot 4 x 6 (in/yd) 1 x Per Week/30 Days Discharge Instructions: Apply Coflex Calamine [employer] as directed. Electronic Signature(s) Signed: 07/24/2023 4:02:03 PM By: Duanne Guess MD FACS Entered By: Duanne Guess on 07/24/2023 15:54:59 -------------------------------------------------------------------------------- Problem List Details Patient Name: Date of Service: [name], [name] 07/24/2023 2:30 PM Medical Record Number: [id number] Patient Account Number: [id number] Date of Birth/Sex: Treating RN: [birth year] (77 y.o. [name] Primary Care Provider: Eustaquio Boyden Other Clinician: Referring Provider: Treating Provider/Extender: Priscille Loveless in Treatment: [address], [place] ([id number]) [id number] Page 6 of 12 Active Problems ICD-10 Encounter Code Description Active Date MDM Diagnosis L97.822 Non-pressure chronic ulcer of other part of left lower leg with fat layer exposed 07/27/2022 No Yes I83.893 Varicose veins of bilateral lower extremities with other complications 07/27/2022 No Yes I87.2 Venous insufficiency (chronic) (peripheral) 07/27/2022 No Yes G62.9 Polyneuropathy, unspecified 07/27/2022 No Yes R60.0 Localized edema 07/27/2022 No Yes Inactive Problems ICD-10 Code Description Active Date Inactive Date L03.115 Cellulitis of right lower limb 10/22/2022 10/22/2022 L97.818 Non-pressure chronic ulcer of other part of right lower leg with other specified severity 03/01/2023 10/22/2022 Resolved Problems Electronic  Signature(s) Signed: 07/24/2023 3:52:22 PM By: Duanne Guess MD FACS Entered By: Duanne Guess on 07/24/2023 15:52:22 -------------------------------------------------------------------------------- Progress Note Details Patient Name: Date of Service: [name] 07/24/2023 2:30 PM Medical Record Number: [id number] Patient Account Number: [id number] Date of Birth/Sex: Treating RN: 21-Sep-1946 (77 y.o. F) Primary Care Provider: Eustaquio Boyden Other Clinician: Referring Provider: Treating Provider/Extender: Priscille Loveless in Treatment: 65 Subjective Chief Complaint Information obtained from Patient Patient presents for treatment of an open ulcer due to venous insufficiency History of Present Illness (HPI) ADMISSION This is a 77 year old woman with a history of chronic venous insufficiency status post saphenous vein ablations in 2010 and 2016. 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 She presents today with an open venous ulcer on her left lower leg. It has been present for a little over 2 weeks. She saw her primary care provider who apparently swabbed the wound and grew out Pseudomonas. She just completed a course of ciprofloxacin for this. ABI was 0.91. She reports that she has had previous issues with ulcers in this same location, stemming back to a punch biopsy taken by a dermatologist many years ago. She has had several skin substitutes applied to the area that have ultimately resulted in healing on prior occasions. [name], [name] ([id number]) [id number] Page 7 of 12 She has 2 small ulcers on her left medial lower leg. There is slough accumulation in both of them. The more anterior of the 2 is quite small and has some soft slough on the surface, underneath which there is good granulation tissue. The more medial wound also has slough accumulation, but the underlying surface is fairly fibrotic and  gritty. This is consistent with her provided history of multiple ulcers in the same location. 08/03/2022: The anterior wound is smaller today with just a little bit of slough accumulation. The more medial wound continues to be very sensitive and fibrotic with slough buildup. 08/13/2022:  [name], [name] ([id number]) [id number] Page 1 of 12 Visit Report for 07/24/2023 Chief Complaint Document Details Patient Name: Date of Service: [name], [name] 07/24/2023 2:30 PM Medical Record Number: [id number] Patient Account Number: [id number] Date of Birth/Sex: Treating RN: 10/28/1946 (77 y.o. F) Primary Care Provider: Eustaquio Boyden Other Clinician: Referring Provider: Treating Provider/Extender: Priscille Loveless in Treatment: 43 Information Obtained from: Patient Chief Complaint Patient presents for treatment of an open ulcer due to venous insufficiency Electronic Signature(s) Signed: 07/24/2023 3:52:43 PM By: Duanne Guess MD FACS Entered By: Duanne Guess on 07/24/2023 15:52:43 -------------------------------------------------------------------------------- Debridement Details Patient Name: Date of Service: [name] 07/24/2023 2:30 PM Medical Record Number: [id number] Patient Account Number: [id number] Date of Birth/Sex: Treating RN: [birth year] (77 y.o. [name] Primary Care Provider: Eustaquio Boyden Other Clinician: Referring Provider: Treating Provider/Extender: Priscille Loveless in Treatment: 51 Debridement Performed for Assessment: Wound #2 Left,Posterior Lower Leg Performed By: Physician Duanne Guess, MD The following information was scribed by: Brenton Grills The information was scribed for: Duanne Guess Debridement Type: Debridement Severity of Tissue Pre Debridement: Fat layer exposed Level of Consciousness (Pre-procedure): Awake and Alert Pre-procedure Verification/Time Out Yes - 15:12 Taken: Start Time: 15:14 Pain Control: Lidocaine 4% T opical Solution Percent of Wound Bed Debrided: 100% T Area Debrided (cm): otal 3.88 Tissue and other material debrided: Non-Viable, Slough, Slough Level: Non-Viable Tissue Debridement Description: Selective/Open  Wound Instrument: Curette Bleeding: Minimum Hemostasis Achieved: Pressure Response to Treatment: Procedure was tolerated well Level of Consciousness (Post- Awake and Alert procedure): Post Debridement Measurements of Total Wound Length: (cm) 3.8 Width: (cm) 1.3 Depth: (cm) 0.1 Volume: (cm) 0.388 Character of Wound/Ulcer Post Debridement: Improved Severity of Tissue Post Debridement: Fat layer exposed [name], [name] ([id number]) [id number] Page 2 of 12 Post Procedure Diagnosis Same as Pre-procedure Electronic Signature(s) Signed: 07/24/2023 4:02:03 PM By: Duanne Guess MD FACS Signed: 07/25/2023 4:20:28 PM By: Brenton Grills Entered By: Brenton Grills on 07/24/2023 15:13:38 -------------------------------------------------------------------------------- HPI Details Patient Name: Date of Service: [name], [name] 07/24/2023 2:30 PM Medical Record Number: [id number] Patient Account Number: [id number] Date of Birth/Sex: Treating RN: 05-[name]-1946 (77 y.o. F) Primary Care Provider: Eustaquio Boyden Other Clinician: Referring Provider: Treating Provider/Extender: Priscille Loveless in Treatment: 67 History of Present Illness HPI Description: ADMISSION This is a 77 year old woman with a history of chronic venous insufficiency status post saphenous vein ablations in 2010 and 2016. 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 She presents today with an open venous ulcer on her left lower leg. It has been present for a little over 2 weeks. She saw her primary care provider who apparently swabbed the wound and grew out Pseudomonas. She just completed a course of ciprofloxacin for this. ABI was 0.91. She reports that she has had previous issues with ulcers in this same location, stemming back to a punch biopsy taken by a dermatologist many years ago. She has had several skin substitutes applied to the area that have  ultimately resulted in healing on prior occasions. She has 2 small ulcers on her left medial lower leg. There is slough accumulation in both of them. The more anterior of the 2 is quite small and has some soft slough on the surface, underneath which there is good granulation tissue. The more medial wound also has slough accumulation, but the underlying surface is fairly fibrotic and gritty. This is consistent with her provided history of multiple ulcers in the same location. 08/03/2022:  [name], [name] Weeks in Treatment: 51 Information Obtained From Patient Eyes Medical History: Positive for: Cataracts - L  eye Ear/Nose/Mouth/Throat Medical History: Negative for: Chronic sinus problems/congestion; Middle ear problems [name], [name] ([id number]) [id number] Page 11 of 12 Hematologic/Lymphatic Medical History: Negative for: Anemia; Human Immunodeficiency Virus; Lymphedema Respiratory Medical History: Positive for: Asthma Past Medical History Notes: OSA on CPAP Cardiovascular Medical History: Positive for: Hypertension Past Medical History Notes: Cronic venous insufficiency 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 Gastrointestinal Medical History: Past Medical History Notes: liver cyst Endocrine Medical History: Negative for: Type I Diabetes; Type II Diabetes Musculoskeletal Medical History: Past Medical History Notes: arthritis Neurologic Medical History: Positive for: Neuropathy - Feet and finger Negative for: Seizure Disorder Past Medical History Notes: restless leg syndrome Oncologic Medical History: Positive for: Received Chemotherapy - 2012; Received Radiation - 2012 HBO Extended History Items Eyes: Cataracts Immunizations Pneumococcal Vaccine: Received Pneumococcal Vaccination: Yes Received Pneumococcal Vaccination On or After 60th Birthday: Yes Implantable Devices No devices added Hospitalization / Surgery History Type of Hospitalization/Surgery Lower extremity venography 2020 Intravascular ultrasound peripheral vascular intervention melanoma 2011 Family and Social History Cancer: Yes - Mother,Paternal Grandparents; Diabetes: Yes - Siblings; Heart Disease: Yes - Father; Hypertension: Yes - Father; Former smoker - quit in [employer]) KENIESHA, CRAVENER ([id number]) [id number] Page 12 of 12 Signed: 07/24/2023 4:02:03 PM By: Duanne Guess MD FACS Entered By: Duanne Guess on 07/24/2023  15:54:13 -------------------------------------------------------------------------------- SuperBill Details Patient Name: Date of Service: [name] 07/24/2023 Medical Record Number: [id number] Patient Account Number: [id number] Date of Birth/Sex: Treating RN: 16-Feb-1946 (77 y.o. F) Primary Care Provider: Eustaquio Boyden Other Clinician: Referring Provider: Treating Provider/Extender: Priscille Loveless in Treatment: 51 Diagnosis Coding ICD-10 Codes Code Description [phone] Non-pressure chronic ulcer of other part of left lower leg with fat layer exposed I83.893 Varicose veins of bilateral lower extremities with other complications I87.2 Venous insufficiency (chronic) (peripheral) G62.9 Polyneuropathy, unspecified R60.0 Localized edema Facility Procedures : CPT4 Code: 41324401 Description: 97597 - DEBRIDE WOUND 1ST 20 SQ CM OR < ICD-10 Diagnosis Description L97.822 Non-pressure chronic ulcer of other part of left lower leg with fat layer expose Modifier: d Quantity: 1 Physician Procedures : CPT4 Code Description Modifier [id number] 99214 - WC PHYS LEVEL 4 - EST PT 25 ICD-10 Diagnosis Description L97.822 Non-pressure chronic ulcer of other part of left lower leg with fat layer exposed I83.893 Varicose veins of bilateral lower extremities with  other complications I87.2 Venous insufficiency (chronic) (peripheral) R60.0 Localized edema Quantity: 1 : [id number] 97597 - WC PHYS DEBR WO ANESTH 20 SQ CM ICD-10 Diagnosis Description L97.822 Non-pressure chronic ulcer of other part of left lower leg with fat layer exposed Quantity: 1 Electronic Signature(s) Signed: 07/24/2023 3:56:22 PM By: Duanne Guess MD FACS Entered By: Duanne Guess on 07/24/2023 15:56:22  Promogran Prisma Matrix, 4.34 (sq in) (silver collagen) 1 x  Per Week/30 Days ary Discharge Instructions: Moisten collagen with saline or hydrogel Secondary Dressing: Optifoam Non-Adhesive Dressing, 4x4 in 1 x Per Week/30 Days Discharge Instructions: Apply over primary dressing as directed. Secondary Dressing: Zetuvit Plus 4x8 in 1 x Per Week/30 Days Discharge Instructions: Apply over primary dressing as directed. Compression Wrap: CoFlex Calamine Unna Boot 4 x 6 (in/yd) 1 x Per Week/30 Days Discharge Instructions: Apply Coflex [employer]amine [employer] as directed. Electronic Signature(s) Signed: 07/24/2023 4:02:03 PM By: Duanne Guess MD FACS Entered By: Duanne Guess on 07/24/2023 15:54:59 -------------------------------------------------------------------------------- Problem List Details Patient Name: Date of Service: [name], [name] 07/24/2023 2:30 PM Medical Record Number: [id number] Patient Account Number: [id number] Date of Birth/Sex: Treating RN: [birth year] (77 y.o. [name] Primary Care Provider: Eustaquio Boyden Other Clinician: Referring Provider: Treating Provider/Extender: Priscille Loveless in Treatment: [address], [place] ([id number]) [id number] Page 6 of 12 Active Problems ICD-10 Encounter Code Description Active Date MDM Diagnosis L97.822 Non-pressure chronic ulcer of other part of left lower leg with fat layer exposed 07/27/2022 No Yes I83.893 Varicose veins of bilateral lower extremities with other complications 07/27/2022 No Yes I87.2 Venous insufficiency (chronic) (peripheral) 07/27/2022 No Yes G62.9 Polyneuropathy, unspecified 07/27/2022 No Yes R60.0 Localized edema 07/27/2022 No Yes Inactive Problems ICD-10 Code Description Active Date Inactive Date L03.115 Cellulitis of right lower limb 10/22/2022 10/22/2022 L97.818 Non-pressure chronic ulcer of other part of right lower leg with other specified severity 03/01/2023 10/22/2022 Resolved Problems Electronic  Signature(s) Signed: 07/24/2023 3:52:22 PM By: Duanne Guess MD FACS Entered By: Duanne Guess on 07/24/2023 15:52:22 -------------------------------------------------------------------------------- Progress Note Details Patient Name: Date of Service: [name] 07/24/2023 2:30 PM Medical Record Number: [id number] Patient Account Number: [id number] Date of Birth/Sex: Treating RN: 21-Sep-1946 (77 y.o. F) Primary Care Provider: Eustaquio Boyden Other Clinician: Referring Provider: Treating Provider/Extender: Priscille Loveless in Treatment: 65 Subjective Chief Complaint Information obtained from Patient Patient presents for treatment of an open ulcer due to venous insufficiency History of Present Illness (HPI) ADMISSION This is a 77 year old woman with a history of chronic venous insufficiency status post saphenous vein ablations in 2010 and 2016. 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 She presents today with an open venous ulcer on her left lower leg. It has been present for a little over 2 weeks. She saw her primary care provider who apparently swabbed the wound and grew out Pseudomonas. She just completed a course of ciprofloxacin for this. ABI was 0.91. She reports that she has had previous issues with ulcers in this same location, stemming back to a punch biopsy taken by a dermatologist many years ago. She has had several skin substitutes applied to the area that have ultimately resulted in healing on prior occasions. [name], [name] ([id number]) [id number] Page 7 of 12 She has 2 small ulcers on her left medial lower leg. There is slough accumulation in both of them. The more anterior of the 2 is quite small and has some soft slough on the surface, underneath which there is good granulation tissue. The more medial wound also has slough accumulation, but the underlying surface is fairly fibrotic and  gritty. This is consistent with her provided history of multiple ulcers in the same location. 08/03/2022: The anterior wound is smaller today with just a little bit of slough accumulation. The more medial wound continues to be very sensitive and fibrotic with slough buildup. 08/13/2022:

## 2023-07-26 ENCOUNTER — Telehealth: Payer: Self-pay | Admitting: Pulmonary Disease

## 2023-07-26 NOTE — Telephone Encounter (Signed)
Pt calling in to get patient sch for follow up W Dr. Vassie Loll and first avail 12/09 pt just wants her Cpap Machine read

## 2023-07-29 ENCOUNTER — Telehealth: Payer: Self-pay

## 2023-07-29 ENCOUNTER — Telehealth (INDEPENDENT_AMBULATORY_CARE_PROVIDER_SITE_OTHER): Payer: Medicare Other | Admitting: Family Medicine

## 2023-07-29 ENCOUNTER — Encounter: Payer: Self-pay | Admitting: Family Medicine

## 2023-07-29 VITALS — Ht 61.0 in | Wt 210.0 lb

## 2023-07-29 DIAGNOSIS — I10 Essential (primary) hypertension: Secondary | ICD-10-CM

## 2023-07-29 DIAGNOSIS — U071 COVID-19: Secondary | ICD-10-CM

## 2023-07-29 DIAGNOSIS — G4733 Obstructive sleep apnea (adult) (pediatric): Secondary | ICD-10-CM

## 2023-07-29 DIAGNOSIS — G629 Polyneuropathy, unspecified: Secondary | ICD-10-CM | POA: Diagnosis not present

## 2023-07-29 DIAGNOSIS — J4520 Mild intermittent asthma, uncomplicated: Secondary | ICD-10-CM

## 2023-07-29 HISTORY — DX: COVID-19: U07.1

## 2023-07-29 MED ORDER — NIRMATRELVIR/RITONAVIR (PAXLOVID)TABLET: 3.0000 | ORAL_TABLET | Freq: Two times a day (BID) | ORAL | 0 refills | Status: AC

## 2023-07-29 MED ORDER — PREGABALIN 25 MG PO CAPS
25.0000 mg | ORAL_CAPSULE | Freq: Two times a day (BID) | ORAL | 3 refills | Status: DC
Start: 2023-07-29 — End: 2023-10-14

## 2023-07-29 NOTE — Telephone Encounter (Signed)
Attempted to call pt follow up on triage 07/28/23 and offer an appointment if needed.  Pt did not answer phone.  Left a message to call back.

## 2023-07-29 NOTE — Telephone Encounter (Signed)
Per Dr Reece Agar, ok to offer 4:30 video visit for Covid exposure.  Lvm asking pt to call back. Need to offer video visit above.

## 2023-07-29 NOTE — Telephone Encounter (Signed)
Looks like pt called back and scheduled OV tomorrow at 12:00.

## 2023-07-29 NOTE — Telephone Encounter (Signed)
Lvm asking pt to call back. Need to get pt ready for 4:30 video visit.

## 2023-07-29 NOTE — Telephone Encounter (Signed)
Pt rtn call. Got her ready for video visit.

## 2023-07-29 NOTE — Assessment & Plan Note (Signed)
Presumed COVID given sick contacts and symptoms.  Reviewed currently approved antiviral treatments as well as side effects and possible rebound COVID.  Reviewed expected course of illness, anticipated course of recovery, as well as red flags to suggest COVID pneumonia and/or to seek urgent in-person care. Reviewed CDC isolation/quarantine guidelines.  Encouraged fluids and rest. Reviewed further supportive care measures at home including vit C 500mg  bid, vit D 2000 IU daily, zinc 100mg  daily, tylenol PRN, pepcid 20mg  BID PRN.   Recommend:  Paxlovid full dose  Paxlovid drug interactions:  None   [email or web address]

## 2023-07-29 NOTE — Progress Notes (Signed)
Ph: [phone] Fax: [phone]   Patient ID: [name], female    DOB: 12-Dec-1945, 77 y.o.   MRN: [id number]  Virtual visit completed through MyChart, a video enabled telemedicine application. Due to national recommendations of social distancing due to COVID-19, a virtual visit is felt to be most appropriate for this patient at this time. Reviewed limitations, risks, security and privacy concerns of performing a virtual visit and the availability of in person appointments. I also reviewed that there may be a patient responsible charge related to this service. The patient agreed to proceed.   Patient location: at sister's house Provider location: Adult nurse at [facility], office Persons participating in this virtual visit: patient, provider   If any vitals were documented, they were collected by patient at home unless specified below.    Ht 5\' 1"  (1.549 m)   Wt 210 lb (95.3 kg)   BMI 39.68 kg/m    CC: COVID exposure Subjective:   HPI: [name] is a 77 y.o. female presenting on 07/29/2023 for Covid Exposure (C/o prod cough, chills, body aches, scratchy throat and runny nose. Sxs started 07/27/23. Exposed to Covid+ b-i-l, who is now in the hospital. Pt has not done Covid test. Now staying with sister, who is also being treated for Covid by her doctor even though no tested. )    First day of symptoms: 07/27/2023  Tested COVID positive: did not get tested but positive exposure - BIL now hospitalized with COVID, she is staying with sister who is also exhibiting symptoms Current symptoms: feverish with chills, body aches, productive cough, sore throat and runny nose.  No: diarrhea, vomiting, loss of taste or smell, dyspnea  Treatments to date: albuterol inhaler several times a day, Tylenol ES   Risk factors include: age, obesity, asthma, HTN, OSA   COVID vaccination status: x6   BIL tested negative for influenza      Relevant past medical, surgical, family and social  history reviewed and updated as indicated. Interim medical history since our last visit reviewed. Allergies and medications reviewed and updated. Outpatient Medications Prior to Visit  Medication Sig Dispense Refill   acetaminophen (TYLENOL) 650 MG CR tablet Take 2 tablets (1,300 mg total) by mouth 2 (two) times daily as needed for pain.     albuterol (VENTOLIN HFA) 108 (90 Base) MCG/ACT inhaler INHALE 1-2 PUFFS BY MOUTH EVERY 6 HOURS AS NEEDED FOR WHEEZE OR SHORTNESS OF BREATH 8.5 each 2   aspirin EC 81 MG tablet Take 81 mg by mouth at bedtime. Melanoma prevention     furosemide (LASIX) 40 MG tablet Take 1 tablet (40 mg total) by mouth daily. Take second dose if needed for leg swelling/weight gain 100 tablet 4   loratadine (CLARITIN) 10 MG tablet Take 10 mg by mouth daily.     losartan (COZAAR) 50 MG tablet Take 1 tablet (50 mg total) by mouth daily. 90 tablet 4   montelukast (SINGULAIR) 10 MG tablet Take 1 tablet (10 mg total) by mouth daily. 90 tablet 4   Multiple Vitamin (MULTIVITAMIN WITH MINERALS) TABS tablet Take 1 tablet by mouth daily.     phenol (CHLORASEPTIC GARGLE) 1.4 % LIQD Use as directed 1 spray in the mouth or throat as needed for throat irritation / pain. 118 mL 0   Polyvinyl Alcohol-Povidone (TEARS PLUS OP) Place 1 drop into both eyes daily as needed (dry eyes/ redness/ burning).      potassium chloride SA (KLOR-CON M20) 20 MEQ  tablet Take 1 tablet (20 mEq total) by mouth 2 (two) times daily. 180 tablet 4   PRESCRIPTION MEDICATION CPAP     saccharomyces boulardii (FLORASTOR) 250 MG capsule Take 1 capsule (250 mg total) by mouth 2 (two) times daily.     triamcinolone (NASACORT) 55 MCG/ACT AERO nasal inhaler Place 2 sprays into the nose daily. In each nostril     Vitamin D, Ergocalciferol, (DRISDOL) 1.25 MG (50000 UNIT) CAPS capsule TAKE 1 CAPSULE BY MOUTH ONCE A WEEK 12 capsule 4   predniSONE (DELTASONE) 20 MG tablet Take 1 tablet (20 mg total) by mouth daily with breakfast. 5  tablet 0   pregabalin (LYRICA) 25 MG capsule TAKE 1 CAPSULE (25 MG TOTAL) BY MOUTH IN THE MORNING AND 2 CAPSULES (50 MG TOTAL) AT BEDTIME. (Patient taking differently: TAKE 1 CAPSULE (25 MG TOTAL) BY MOUTH IN THE MORNING AND 2 CAPSULES (50 MG TOTAL) AT BEDTIME. Pt takes 1 capsule in morning and 1 capsule at bedtime. ) 90 capsule 1   No facility-administered medications prior to visit.     Per HPI unless specifically indicated in ROS section below Review of Systems Objective:  Ht 5\' 1"  (1.549 m)   Wt 210 lb (95.3 kg)   BMI 39.68 kg/m   Wt Readings from Last 3 Encounters:  07/29/23 210 lb (95.3 kg)  04/29/23 214 lb (97.1 kg)  03/11/23 211 lb 3.2 oz (95.8 kg)       Physical exam: Gen: alert, NAD, not ill appearing Pulm: speaks in complete sentences without increased work of breathing Psych: normal mood, normal thought content      Lab Results  Component Value Date   NA 142 10/24/2022   CL 104 10/24/2022   K 3.8 10/24/2022   CO2 32 10/24/2022   BUN 14 10/24/2022   CREATININE 0.77 10/24/2022   GFR 75.00 10/24/2022   CALCIUM 9.4 10/24/2022   PHOS 3.3 01/28/2022   ALBUMIN 3.9 10/24/2022   GLUCOSE 86 10/24/2022    Assessment & Plan:   COVID-19 virus infection Assessment & Plan: Presumed COVID given sick contacts and symptoms.  Reviewed currently approved antiviral treatments as well as side effects and possible rebound COVID.  Reviewed expected course of illness, anticipated course of recovery, as well as red flags to suggest COVID pneumonia and/or to seek urgent in-person care. Reviewed CDC isolation/quarantine guidelines.  Encouraged fluids and rest. Reviewed further supportive care measures at home including vit C 500mg  bid, vit D 2000 IU daily, zinc 100mg  daily, tylenol PRN, pepcid 20mg  BID PRN.   Recommend:  Paxlovid full dose  Paxlovid drug interactions:  None   [email or web address]    Peripheral polyneuropathy -     Pregabalin; Take 1 capsule (25 mg total) by  mouth 2 (two) times daily.  Dispense: 60 capsule; Refill: 3  Primary hypertension  Mild intermittent asthma in adult without complication  OSA on CPAP  Severe obesity (BMI 35.0-39.9) with comorbidity (HCC)  Other orders -     nirmatrelvir/ritonavir; Take 3 tablets by mouth 2 (two) times daily for 5 days. (Take nirmatrelvir 150 mg two tablets twice daily for 5 days and ritonavir 100 mg one tablet twice daily for 5 days) Patient GFR is 75  Dispense: 30 tablet; Refill: 0     I discussed the assessment and treatment plan with the patient. The patient was provided an opportunity to ask questions and all were answered. The patient agreed with the plan and demonstrated an understanding of the instructions. The  patient was advised to call back or seek an in-person evaluation if the symptoms worsen or if the condition fails to improve as anticipated.  Follow up plan: No follow-ups on file.  Eustaquio Boyden, MD

## 2023-07-29 NOTE — Telephone Encounter (Signed)
Pt has been r/s for video visit today at 4:30 with Dr Reece Agar.

## 2023-07-30 ENCOUNTER — Telehealth: Payer: Medicare Other | Admitting: Family Medicine

## 2023-07-31 ENCOUNTER — Ambulatory Visit (HOSPITAL_BASED_OUTPATIENT_CLINIC_OR_DEPARTMENT_OTHER): Payer: Medicare Other | Admitting: General Surgery

## 2023-08-01 NOTE — Telephone Encounter (Signed)
Pt now has f/u scheduled with TP on 11/7.

## 2023-08-05 ENCOUNTER — Telehealth: Payer: Self-pay | Admitting: Family Medicine

## 2023-08-05 NOTE — Telephone Encounter (Signed)
Left message on vm, per dpr notifying pt flu shot and Covid booster shot. Pt can schedule a [place] for flu shot. However, will need to get Covid booster shot with local pharmacy, since we do not offer them at our office.

## 2023-08-05 NOTE — Telephone Encounter (Signed)
Pt called asking if Dr. Reece Agar think it'd be fine for her to receive her flu & covid shot? Call back # [phone]

## 2023-08-07 ENCOUNTER — Encounter (HOSPITAL_BASED_OUTPATIENT_CLINIC_OR_DEPARTMENT_OTHER): Payer: Medicare Other | Attending: General Surgery | Admitting: General Surgery

## 2023-08-07 DIAGNOSIS — I83893 Varicose veins of bilateral lower extremities with other complications: Secondary | ICD-10-CM | POA: Diagnosis not present

## 2023-08-07 DIAGNOSIS — W19XXXA Unspecified fall, initial encounter: Secondary | ICD-10-CM | POA: Insufficient documentation

## 2023-08-07 DIAGNOSIS — L97822 Non-pressure chronic ulcer of other part of left lower leg with fat layer exposed: Secondary | ICD-10-CM | POA: Diagnosis not present

## 2023-08-07 DIAGNOSIS — I872 Venous insufficiency (chronic) (peripheral): Secondary | ICD-10-CM | POA: Insufficient documentation

## 2023-08-07 DIAGNOSIS — Z923 Personal history of irradiation: Secondary | ICD-10-CM | POA: Diagnosis not present

## 2023-08-07 DIAGNOSIS — I871 Compression of vein: Secondary | ICD-10-CM | POA: Insufficient documentation

## 2023-08-07 DIAGNOSIS — Z86718 Personal history of other venous thrombosis and embolism: Secondary | ICD-10-CM | POA: Diagnosis not present

## 2023-08-07 DIAGNOSIS — J45909 Unspecified asthma, uncomplicated: Secondary | ICD-10-CM | POA: Diagnosis not present

## 2023-08-07 DIAGNOSIS — Z9221 Personal history of antineoplastic chemotherapy: Secondary | ICD-10-CM | POA: Diagnosis not present

## 2023-08-07 DIAGNOSIS — I10 Essential (primary) hypertension: Secondary | ICD-10-CM | POA: Insufficient documentation

## 2023-08-07 DIAGNOSIS — L97222 Non-pressure chronic ulcer of left calf with fat layer exposed: Secondary | ICD-10-CM | POA: Diagnosis not present

## 2023-08-07 DIAGNOSIS — G4733 Obstructive sleep apnea (adult) (pediatric): Secondary | ICD-10-CM | POA: Insufficient documentation

## 2023-08-07 NOTE — Progress Notes (Signed)
JERELENE, SALAAM ([id number]) [id number] Page 1 of 12 Visit Report for 08/07/2023 Chief Complaint Document Details Patient Name: Date of Service: [name], [name] 08/07/2023 1:45 PM Medical Record Number: [id number] Patient Account Number: [id number] Date of Birth/Sex: Treating RN: [birth year] (77 y.o. F) Primary Care Provider: Eustaquio Boyden Other Clinician: Referring Provider: Treating Provider/Extender: Priscille Loveless in Treatment: 23 Information Obtained from: Patient Chief Complaint Patient presents for treatment of an open ulcer due to venous insufficiency Electronic Signature(s) Signed: 08/07/2023 2:58:52 PM By: Duanne Guess MD FACS Entered By: Duanne Guess on 08/07/2023 11:58:51 -------------------------------------------------------------------------------- Debridement Details Patient Name: Date of Service: [name], [name] 08/07/2023 1:45 PM Medical Record Number: [id number] Patient Account Number: [id number] Date of Birth/Sex: Treating RN: 12-07-1945 (77 y.o. [name] Primary Care Provider: Eustaquio Boyden Other Clinician: Referring Provider: Treating Provider/Extender: Priscille Loveless in Treatment: 53 Debridement Performed for Assessment: Wound #2 Left,Posterior Lower Leg Performed By: Physician Duanne Guess, MD The following information was scribed by: Brenton Grills The information was scribed for: Duanne Guess Debridement Type: Debridement Severity of Tissue Pre Debridement: Fat layer exposed Level of Consciousness (Pre-procedure): Awake and Alert Pre-procedure Verification/Time Out Yes - 14:14 Taken: Start Time: 14:15 Pain Control: Lidocaine 4% Topical Solution Percent of Wound Bed Debrided: 100% T Area Debrided (cm): otal 1.96 Tissue and other material debrided: Non-Viable, Eschar, Slough, Slough Level: Non-Viable Tissue Debridement Description:  Selective/Open Wound Instrument: Curette Bleeding: Minimum Hemostasis Achieved: Pressure Response to Treatment: Procedure was tolerated well Level of Consciousness (Post- Awake and Alert procedure): Post Debridement Measurements of Total Wound Length: (cm) 2.5 Width: (cm) 1 Depth: (cm) 0.1 Volume: (cm) 0.196 Character of Wound/Ulcer Post Debridement: Improved Severity of Tissue Post Debridement: Fat layer exposed [name], [name] ([id number]) [id number] Page 2 of 12 Post Procedure Diagnosis Same as Pre-procedure Electronic Signature(s) Signed: 08/07/2023 4:23:32 PM By: Brenton Grills Signed: 08/07/2023 4:32:07 PM By: Duanne Guess MD FACS Entered By: Brenton Grills on 08/07/2023 11:17:14 -------------------------------------------------------------------------------- HPI Details Patient Name: Date of Service: [name], [name] 08/07/2023 1:45 PM Medical Record Number: [id number] Patient Account Number: [id number] Date of Birth/Sex: Treating RN: 01-06-1946 (77 y.o. F) Primary Care Provider: Eustaquio Boyden Other Clinician: Referring Provider: Treating Provider/Extender: Priscille Loveless in Treatment: 51 History of Present Illness HPI Description: ADMISSION This is a 77 year old woman with a history of chronic venous insufficiency status post saphenous vein ablations in 2010 and 2016. 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 She presents today with an open venous ulcer on her left lower leg. It has been present for a little over 2 weeks. She saw her primary care provider who apparently swabbed the wound and grew out Pseudomonas. She just completed a course of ciprofloxacin for this. ABI was 0.91. She reports that she has had previous issues with ulcers in this same location, stemming back to a punch biopsy taken by a dermatologist many years ago. She has had several skin substitutes applied to the area  that have ultimately resulted in healing on prior occasions. She has 2 small ulcers on her left medial lower leg. There is slough accumulation in both of them. The more anterior of the 2 is quite small and has some soft slough on the surface, underneath which there is good granulation tissue. The more medial wound also has slough accumulation, but the underlying surface is fairly fibrotic and gritty. This is consistent with her provided history of multiple ulcers in the same location. 08/03/2022:  is a little bit of slough overlying granulation tissue. [name], [name] ([id number]) [id number] Page 4 of 12 07/24/2023: The band of epithelium between the 2 open areas has gotten wider. The more proximal area has more epithelialization around the perimeter. Both have slough on the surface and remain fairly fibrotic. Moisture balance remains good. 08/07/2023: Both open areas are little bit smaller today. The band of epithelium between the 2 continues  to enlarge. There is slough and eschar on both surfaces. Electronic Signature(s) Signed: 08/07/2023 2:59:40 PM By: Duanne Guess MD FACS Entered By: Duanne Guess on 08/07/2023 11:59:40 -------------------------------------------------------------------------------- Physical Exam Details Patient Name: Date of Service: [name], [name] 08/07/2023 1:45 PM Medical Record Number: [id number] Patient Account Number: [id number] Date of Birth/Sex: Treating RN: 08/30/1946 (77 y.o. F) Primary Care Provider: Eustaquio Boyden Other Clinician: Referring Provider: Treating Provider/Extender: Priscille Loveless in Treatment: 95 Constitutional Hypertensive, asymptomatic. . . . no acute distress. Respiratory Normal work of breathing on room air. Notes 08/07/2023: Both open areas are little bit smaller today. The band of epithelium between the 2 continues to enlarge. There is slough and eschar on both surfaces. Electronic Signature(s) Signed: 08/07/2023 3:00:17 PM By: Duanne Guess MD FACS Entered By: Duanne Guess on 08/07/2023 12:00:16 -------------------------------------------------------------------------------- Physician Orders Details Patient Name: Date of Service: [name], [name] 08/07/2023 1:45 PM Medical Record Number: [id number] Patient Account Number: [id number] Date of Birth/Sex: Treating RN: 13-May-1946 (77 y.o. 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 - Dr. Lady Gary Room 3 Return A [name], [name] ([id number]) [id number] Page 5 of 12 Anesthetic Wound #2 Left,Posterior Lower Leg (In clinic) Topical Lidocaine 4% applied to wound bed Bathing/ Shower/ Hygiene May shower with protection but do not get wound dressing(s) wet. Protect dressing(s) with water repellant cover (for example, large plastic bag) or a cast cover and may then take shower. Edema Control - Lymphedema / SCD / Other Left Lower Extremity Lymphedema Pumps. Use Lymphedema pumps on leg(s) 2-3 times a day for 45-60 minutes. If wearing any wraps or hose, do not remove them. Continue exercising as instructed. - Use x1 per day Avoid standing for long periods of time. Exercise regularly Off-Loading Wound #2 Left,Posterior Lower Leg Other: - Elevate legs at heart level or above heart level while sitting. Additional Orders / Instructions Other: - Recommend Nail Care Wound Treatment Wound #2 - Lower Leg Wound Laterality: Left, Posterior Cleanser: Soap and Water 1 x Per Week/30 Days Discharge Instructions: May shower and wash wound with dial antibacterial soap and water prior to dressing change. Peri-Wound Care: Ketoconazole Cream 2% 1 x Per Week/30 Days Discharge Instructions: Apply Ketoconazole as directed Peri-Wound Care: Triamcinolone 15 (g) 1 x Per Week/30 Days Discharge Instructions: Use triamcinolone 15 (g) as directed Peri-Wound Care: Zinc Oxide Ointment 30g tube 1 x Per Week/30 Days Discharge Instructions: Apply Zinc Oxide to periwound with each dressing change Peri-Wound Care: Sween Lotion (Moisturizing lotion) 1 x Per Week/30 Days Discharge Instructions: Apply moisturizing lotion as directed Topical: Gentamicin 1 x Per  is a little bit of slough overlying granulation tissue. [name], [name] ([id number]) [id number] Page 4 of 12 07/24/2023: The band of epithelium between the 2 open areas has gotten wider. The more proximal area has more epithelialization around the perimeter. Both have slough on the surface and remain fairly fibrotic. Moisture balance remains good. 08/07/2023: Both open areas are little bit smaller today. The band of epithelium between the 2 continues  to enlarge. There is slough and eschar on both surfaces. Electronic Signature(s) Signed: 08/07/2023 2:59:40 PM By: Duanne Guess MD FACS Entered By: Duanne Guess on 08/07/2023 11:59:40 -------------------------------------------------------------------------------- Physical Exam Details Patient Name: Date of Service: [name], [name] 08/07/2023 1:45 PM Medical Record Number: [id number] Patient Account Number: [id number] Date of Birth/Sex: Treating RN: 08/30/1946 (77 y.o. F) Primary Care Provider: Eustaquio Boyden Other Clinician: Referring Provider: Treating Provider/Extender: Priscille Loveless in Treatment: 95 Constitutional Hypertensive, asymptomatic. . . . no acute distress. Respiratory Normal work of breathing on room air. Notes 08/07/2023: Both open areas are little bit smaller today. The band of epithelium between the 2 continues to enlarge. There is slough and eschar on both surfaces. Electronic Signature(s) Signed: 08/07/2023 3:00:17 PM By: Duanne Guess MD FACS Entered By: Duanne Guess on 08/07/2023 12:00:16 -------------------------------------------------------------------------------- Physician Orders Details Patient Name: Date of Service: [name], [name] 08/07/2023 1:45 PM Medical Record Number: [id number] Patient Account Number: [id number] Date of Birth/Sex: Treating RN: 13-May-1946 (77 y.o. 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 - Dr. Lady Gary Room 3 Return A [name], [name] ([id number]) [id number] Page 5 of 12 Anesthetic Wound #2 Left,Posterior Lower Leg (In clinic) Topical Lidocaine 4% applied to wound bed Bathing/ Shower/ Hygiene May shower with protection but do not get wound dressing(s) wet. Protect dressing(s) with water repellant cover (for example, large plastic bag) or a cast cover and may then take shower. Edema Control - Lymphedema / SCD / Other Left Lower Extremity Lymphedema Pumps. Use Lymphedema pumps on leg(s) 2-3 times a day for 45-60 minutes. If wearing any wraps or hose, do not remove them. Continue exercising as instructed. - Use x1 per day Avoid standing for long periods of time. Exercise regularly Off-Loading Wound #2 Left,Posterior Lower Leg Other: - Elevate legs at heart level or above heart level while sitting. Additional Orders / Instructions Other: - Recommend Nail Care Wound Treatment Wound #2 - Lower Leg Wound Laterality: Left, Posterior Cleanser: Soap and Water 1 x Per Week/30 Days Discharge Instructions: May shower and wash wound with dial antibacterial soap and water prior to dressing change. Peri-Wound Care: Ketoconazole Cream 2% 1 x Per Week/30 Days Discharge Instructions: Apply Ketoconazole as directed Peri-Wound Care: Triamcinolone 15 (g) 1 x Per Week/30 Days Discharge Instructions: Use triamcinolone 15 (g) as directed Peri-Wound Care: Zinc Oxide Ointment 30g tube 1 x Per Week/30 Days Discharge Instructions: Apply Zinc Oxide to periwound with each dressing change Peri-Wound Care: Sween Lotion (Moisturizing lotion) 1 x Per Week/30 Days Discharge Instructions: Apply moisturizing lotion as directed Topical: Gentamicin 1 x Per  JERELENE, SALAAM ([id number]) [id number] Page 1 of 12 Visit Report for 08/07/2023 Chief Complaint Document Details Patient Name: Date of Service: [name], [name] 08/07/2023 1:45 PM Medical Record Number: [id number] Patient Account Number: [id number] Date of Birth/Sex: Treating RN: [birth year] (77 y.o. F) Primary Care Provider: Eustaquio Boyden Other Clinician: Referring Provider: Treating Provider/Extender: Priscille Loveless in Treatment: 23 Information Obtained from: Patient Chief Complaint Patient presents for treatment of an open ulcer due to venous insufficiency Electronic Signature(s) Signed: 08/07/2023 2:58:52 PM By: Duanne Guess MD FACS Entered By: Duanne Guess on 08/07/2023 11:58:51 -------------------------------------------------------------------------------- Debridement Details Patient Name: Date of Service: [name], [name] 08/07/2023 1:45 PM Medical Record Number: [id number] Patient Account Number: [id number] Date of Birth/Sex: Treating RN: 12-07-1945 (77 y.o. [name] Primary Care Provider: Eustaquio Boyden Other Clinician: Referring Provider: Treating Provider/Extender: Priscille Loveless in Treatment: 53 Debridement Performed for Assessment: Wound #2 Left,Posterior Lower Leg Performed By: Physician Duanne Guess, MD The following information was scribed by: Brenton Grills The information was scribed for: Duanne Guess Debridement Type: Debridement Severity of Tissue Pre Debridement: Fat layer exposed Level of Consciousness (Pre-procedure): Awake and Alert Pre-procedure Verification/Time Out Yes - 14:14 Taken: Start Time: 14:15 Pain Control: Lidocaine 4% Topical Solution Percent of Wound Bed Debrided: 100% T Area Debrided (cm): otal 1.96 Tissue and other material debrided: Non-Viable, Eschar, Slough, Slough Level: Non-Viable Tissue Debridement Description:  Selective/Open Wound Instrument: Curette Bleeding: Minimum Hemostasis Achieved: Pressure Response to Treatment: Procedure was tolerated well Level of Consciousness (Post- Awake and Alert procedure): Post Debridement Measurements of Total Wound Length: (cm) 2.5 Width: (cm) 1 Depth: (cm) 0.1 Volume: (cm) 0.196 Character of Wound/Ulcer Post Debridement: Improved Severity of Tissue Post Debridement: Fat layer exposed [name], [name] ([id number]) [id number] Page 2 of 12 Post Procedure Diagnosis Same as Pre-procedure Electronic Signature(s) Signed: 08/07/2023 4:23:32 PM By: Brenton Grills Signed: 08/07/2023 4:32:07 PM By: Duanne Guess MD FACS Entered By: Brenton Grills on 08/07/2023 11:17:14 -------------------------------------------------------------------------------- HPI Details Patient Name: Date of Service: [name], [name] 08/07/2023 1:45 PM Medical Record Number: [id number] Patient Account Number: [id number] Date of Birth/Sex: Treating RN: 01-06-1946 (77 y.o. F) Primary Care Provider: Eustaquio Boyden Other Clinician: Referring Provider: Treating Provider/Extender: Priscille Loveless in Treatment: 51 History of Present Illness HPI Description: ADMISSION This is a 77 year old woman with a history of chronic venous insufficiency status post saphenous vein ablations in 2010 and 2016. 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 She presents today with an open venous ulcer on her left lower leg. It has been present for a little over 2 weeks. She saw her primary care provider who apparently swabbed the wound and grew out Pseudomonas. She just completed a course of ciprofloxacin for this. ABI was 0.91. She reports that she has had previous issues with ulcers in this same location, stemming back to a punch biopsy taken by a dermatologist many years ago. She has had several skin substitutes applied to the area  that have ultimately resulted in healing on prior occasions. She has 2 small ulcers on her left medial lower leg. There is slough accumulation in both of them. The more anterior of the 2 is quite small and has some soft slough on the surface, underneath which there is good granulation tissue. The more medial wound also has slough accumulation, but the underlying surface is fairly fibrotic and gritty. This is consistent with her provided history of multiple ulcers in the same location. 08/03/2022:  Week/30 Days Discharge Instructions: As directed by physician Topical: Mupirocin  Ointment 1 x Per Week/30 Days Discharge Instructions: Apply Mupirocin (Bactroban) as instructed Topical: Skintegrity Hydrogel 4 (oz) 1 x Per Week/30 Days Discharge Instructions: Apply hydrogel as directed Prim Dressing: Promogran Prisma Matrix, 4.34 (sq in) (silver collagen) 1 x Per Week/30 Days ary Discharge Instructions: Moisten collagen with saline or hydrogel Secondary Dressing: Optifoam Non-Adhesive Dressing, 4x4 in 1 x Per Week/30 Days Discharge Instructions: Apply over primary dressing as directed. Secondary Dressing: Zetuvit Plus 4x8 in 1 x Per Week/30 Days Discharge Instructions: Apply over primary dressing as directed. Compression Wrap: CoFlex Calamine Unna Boot 4 x 6 (in/yd) 1 x Per Week/30 Days Discharge Instructions: Apply Coflex Calamine [employer] as directed. Electronic Signature(s) Signed: 08/07/2023 4:32:07 PM By: Duanne Guess MD FACS Entered By: Duanne Guess on 08/07/2023 12:00:38 Problem List Details -------------------------------------------------------------------------------- [name] ([id number]) [id number] Page 6 of 12 Patient Name: Date of Service: [name], LENGEL 08/07/2023 1:45 PM Medical Record Number: [id number] Patient Account Number: [id number] Date of Birth/Sex: Treating RN: 1946/10/30 (77 y.o. [name] Primary Care Provider: Eustaquio Boyden Other Clinician: Referring Provider: Treating Provider/Extender: Priscille Loveless in Treatment: 52 Active Problems ICD-10 Encounter Code Description Active Date MDM Diagnosis [phone] Non-pressure chronic ulcer of other part of left lower leg with fat layer exposed 07/27/2022 No Yes I83.893 Varicose veins of bilateral lower extremities with other complications 07/27/2022 No Yes I87.2 Venous insufficiency (chronic) (peripheral) 07/27/2022 No Yes G62.9 Polyneuropathy, unspecified 07/27/2022 No Yes R60.0 Localized edema 07/27/2022 No  Yes Inactive Problems ICD-10 Code Description Active Date Inactive Date L03.115 Cellulitis of right lower limb 10/22/2022 10/22/2022 L97.818 Non-pressure chronic ulcer of other part of right lower leg with other specified severity 03/01/2023 10/22/2022 Resolved Problems Electronic Signature(s) Signed: 08/07/2023 2:58:31 PM By: Duanne Guess MD FACS Entered By: Duanne Guess on 08/07/2023 11:58:31 -------------------------------------------------------------------------------- Progress Note Details Patient Name: Date of Service: [name], [name] 08/07/2023 1:45 PM Medical Record Number: [id number] Patient Account Number: [id number] Date of Birth/Sex: Treating RN: [birth year] (77 y.o. F) Primary Care Provider: Eustaquio Boyden Other Clinician: Referring Provider: Treating Provider/Extender: Priscille Loveless in Treatment: 28 Subjective Chief Complaint Information obtained from Patient Patient presents for treatment of an open ulcer due to venous insufficiency DALEYSA, KRISTIANSEN ([id number]) [id number] Page 7 of 12 History of Present Illness (HPI) ADMISSION This is a 77 year old woman with a history of chronic venous insufficiency status post saphenous vein ablations in 2010 and 2016. 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 She presents today with an open venous ulcer on her left lower leg. It has been present for a little over 2 weeks. She saw her primary care provider who apparently swabbed the wound and grew out Pseudomonas. She just completed a course of ciprofloxacin for this. ABI was 0.91. She reports that she has had previous issues with ulcers in this same location, stemming back to a punch biopsy taken by a dermatologist many years ago. She has had several skin substitutes applied to the area that have ultimately resulted in healing on prior occasions. She has 2 small ulcers on her left medial lower  leg. There is slough accumulation in both of them. The more anterior of the 2 is quite small and has some soft slough on the surface, underneath which there is good granulation tissue. The more medial wound also has slough accumulation, but the underlying surface is fairly fibrotic and gritty. This is consistent  Name: Date of Service: [name], [name] 08/07/2023 1:45 PM Medical Record Number: [id number] Patient Account Number: [id number] Date of Birth/Sex: Treating RN: 1946/08/21 (77 y.o. F) Primary Care Provider:  Eustaquio Boyden Other Clinician: Referring Provider: Treating Provider/Extender: Priscille Loveless in Treatment: [address], [place] ([id number]) [id number] Page 11 of 12 Information Obtained From Patient Eyes Medical History: Positive for: Cataracts - L eye Ear/Nose/Mouth/Throat Medical History: Negative for: Chronic sinus problems/congestion; Middle ear problems Hematologic/Lymphatic Medical History: Negative for: Anemia; Human Immunodeficiency Virus; Lymphedema Respiratory Medical History: Positive for: Asthma Past Medical History Notes: OSA on CPAP Cardiovascular Medical History: Positive for: Hypertension Past Medical History Notes: Cronic venous insufficiency 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 Gastrointestinal Medical History: Past Medical History Notes: liver cyst Endocrine Medical History: Negative for: Type I Diabetes; Type II Diabetes Musculoskeletal Medical History: Past Medical History Notes: arthritis Neurologic Medical History: Positive for: Neuropathy - Feet and finger Negative for: Seizure Disorder Past Medical History Notes: restless leg syndrome Oncologic Medical History: Positive for: Received Chemotherapy - 2012; Received Radiation - 2012 HBO Extended History Items Eyes: Cataracts Immunizations Pneumococcal Vaccine: Received Pneumococcal Vaccination: Yes Received Pneumococcal Vaccination On or After 60th Birthday: Yes Implantable Devices No devices added Hospitalization / Surgery History Type of Hospitalization/Surgery RENA, HUNKE ([id number]) [id number] Page 12 of 12 Lower extremity venography 2020 Intravascular ultrasound peripheral vascular intervention melanoma 2011 Family and Social History Cancer: Yes - Mother,Paternal Grandparents; Diabetes: Yes - Siblings; Heart Disease: Yes - Father; Hypertension: Yes - Father;  Former smoker - quit in [employer]) Signed: 08/07/2023 4:32:07 PM By: Duanne Guess MD FACS Entered By: Duanne Guess on 08/07/2023 11:59:53 -------------------------------------------------------------------------------- SuperBill Details Patient Name: Date of Service: [name], [name] 08/07/2023 Medical Record Number: [id number] Patient Account Number: [id number] Date of Birth/Sex: Treating RN: [birth year] (77 y.o. 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 Modifier: d Quantity: 1 Physician Procedures : CPT4 Code Description Modifier [id number] 99214 - WC PHYS LEVEL 4 - EST PT 25 ICD-10 Diagnosis Description L97.822 Non-pressure chronic ulcer of other part of left lower leg with fat layer exposed I83.893 Varicose veins of bilateral lower extremities with  other complications I87.2 Venous insufficiency (chronic) (peripheral) R60.0 Localized edema Quantity: 1 : [id number] 97597 - WC PHYS DEBR WO ANESTH 20 SQ CM ICD-10 Diagnosis Description L97.822 Non-pressure chronic ulcer of other part of left lower leg with fat layer exposed Quantity: 1 Electronic Signature(s) Signed: 08/07/2023 3:01:29 PM By: Duanne Guess MD FACS Entered By: Duanne Guess on 08/07/2023 12:01:29  JERELENE, SALAAM ([id number]) [id number] Page 1 of 12 Visit Report for 08/07/2023 Chief Complaint Document Details Patient Name: Date of Service: [name], [name] 08/07/2023 1:45 PM Medical Record Number: [id number] Patient Account Number: [id number] Date of Birth/Sex: Treating RN: [birth year] (77 y.o. F) Primary Care Provider: Eustaquio Boyden Other Clinician: Referring Provider: Treating Provider/Extender: Priscille Loveless in Treatment: 23 Information Obtained from: Patient Chief Complaint Patient presents for treatment of an open ulcer due to venous insufficiency Electronic Signature(s) Signed: 08/07/2023 2:58:52 PM By: Duanne Guess MD FACS Entered By: Duanne Guess on 08/07/2023 11:58:51 -------------------------------------------------------------------------------- Debridement Details Patient Name: Date of Service: [name], [name] 08/07/2023 1:45 PM Medical Record Number: [id number] Patient Account Number: [id number] Date of Birth/Sex: Treating RN: 12-07-1945 (77 y.o. [name] Primary Care Provider: Eustaquio Boyden Other Clinician: Referring Provider: Treating Provider/Extender: Priscille Loveless in Treatment: 53 Debridement Performed for Assessment: Wound #2 Left,Posterior Lower Leg Performed By: Physician Duanne Guess, MD The following information was scribed by: Brenton Grills The information was scribed for: Duanne Guess Debridement Type: Debridement Severity of Tissue Pre Debridement: Fat layer exposed Level of Consciousness (Pre-procedure): Awake and Alert Pre-procedure Verification/Time Out Yes - 14:14 Taken: Start Time: 14:15 Pain Control: Lidocaine 4% Topical Solution Percent of Wound Bed Debrided: 100% T Area Debrided (cm): otal 1.96 Tissue and other material debrided: Non-Viable, Eschar, Slough, Slough Level: Non-Viable Tissue Debridement Description:  Selective/Open Wound Instrument: Curette Bleeding: Minimum Hemostasis Achieved: Pressure Response to Treatment: Procedure was tolerated well Level of Consciousness (Post- Awake and Alert procedure): Post Debridement Measurements of Total Wound Length: (cm) 2.5 Width: (cm) 1 Depth: (cm) 0.1 Volume: (cm) 0.196 Character of Wound/Ulcer Post Debridement: Improved Severity of Tissue Post Debridement: Fat layer exposed [name], [name] ([id number]) [id number] Page 2 of 12 Post Procedure Diagnosis Same as Pre-procedure Electronic Signature(s) Signed: 08/07/2023 4:23:32 PM By: Brenton Grills Signed: 08/07/2023 4:32:07 PM By: Duanne Guess MD FACS Entered By: Brenton Grills on 08/07/2023 11:17:14 -------------------------------------------------------------------------------- HPI Details Patient Name: Date of Service: [name], [name] 08/07/2023 1:45 PM Medical Record Number: [id number] Patient Account Number: [id number] Date of Birth/Sex: Treating RN: 01-06-1946 (77 y.o. F) Primary Care Provider: Eustaquio Boyden Other Clinician: Referring Provider: Treating Provider/Extender: Priscille Loveless in Treatment: 51 History of Present Illness HPI Description: ADMISSION This is a 77 year old woman with a history of chronic venous insufficiency status post saphenous vein ablations in 2010 and 2016. 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 She presents today with an open venous ulcer on her left lower leg. It has been present for a little over 2 weeks. She saw her primary care provider who apparently swabbed the wound and grew out Pseudomonas. She just completed a course of ciprofloxacin for this. ABI was 0.91. She reports that she has had previous issues with ulcers in this same location, stemming back to a punch biopsy taken by a dermatologist many years ago. She has had several skin substitutes applied to the area  that have ultimately resulted in healing on prior occasions. She has 2 small ulcers on her left medial lower leg. There is slough accumulation in both of them. The more anterior of the 2 is quite small and has some soft slough on the surface, underneath which there is good granulation tissue. The more medial wound also has slough accumulation, but the underlying surface is fairly fibrotic and gritty. This is consistent with her provided history of multiple ulcers in the same location. 08/03/2022:  Name: Date of Service: [name], [name] 08/07/2023 1:45 PM Medical Record Number: [id number] Patient Account Number: [id number] Date of Birth/Sex: Treating RN: 1946/08/21 (77 y.o. F) Primary Care Provider:  Eustaquio Boyden Other Clinician: Referring Provider: Treating Provider/Extender: Priscille Loveless in Treatment: [address], [place] ([id number]) [id number] Page 11 of 12 Information Obtained From Patient Eyes Medical History: Positive for: Cataracts - L eye Ear/Nose/Mouth/Throat Medical History: Negative for: Chronic sinus problems/congestion; Middle ear problems Hematologic/Lymphatic Medical History: Negative for: Anemia; Human Immunodeficiency Virus; Lymphedema Respiratory Medical History: Positive for: Asthma Past Medical History Notes: OSA on CPAP Cardiovascular Medical History: Positive for: Hypertension Past Medical History Notes: Cronic venous insufficiency 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 Gastrointestinal Medical History: Past Medical History Notes: liver cyst Endocrine Medical History: Negative for: Type I Diabetes; Type II Diabetes Musculoskeletal Medical History: Past Medical History Notes: arthritis Neurologic Medical History: Positive for: Neuropathy - Feet and finger Negative for: Seizure Disorder Past Medical History Notes: restless leg syndrome Oncologic Medical History: Positive for: Received Chemotherapy - 2012; Received Radiation - 2012 HBO Extended History Items Eyes: Cataracts Immunizations Pneumococcal Vaccine: Received Pneumococcal Vaccination: Yes Received Pneumococcal Vaccination On or After 60th Birthday: Yes Implantable Devices No devices added Hospitalization / Surgery History Type of Hospitalization/Surgery RENA, HUNKE ([id number]) [id number] Page 12 of 12 Lower extremity venography 2020 Intravascular ultrasound peripheral vascular intervention melanoma 2011 Family and Social History Cancer: Yes - Mother,Paternal Grandparents; Diabetes: Yes - Siblings; Heart Disease: Yes - Father; Hypertension: Yes - Father;  Former smoker - quit in [employer]) Signed: 08/07/2023 4:32:07 PM By: Duanne Guess MD FACS Entered By: Duanne Guess on 08/07/2023 11:59:53 -------------------------------------------------------------------------------- SuperBill Details Patient Name: Date of Service: [name], [name] 08/07/2023 Medical Record Number: [id number] Patient Account Number: [id number] Date of Birth/Sex: Treating RN: [birth year] (77 y.o. 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 Modifier: d Quantity: 1 Physician Procedures : CPT4 Code Description Modifier [id number] 99214 - WC PHYS LEVEL 4 - EST PT 25 ICD-10 Diagnosis Description L97.822 Non-pressure chronic ulcer of other part of left lower leg with fat layer exposed I83.893 Varicose veins of bilateral lower extremities with  other complications I87.2 Venous insufficiency (chronic) (peripheral) R60.0 Localized edema Quantity: 1 : [id number] 97597 - WC PHYS DEBR WO ANESTH 20 SQ CM ICD-10 Diagnosis Description L97.822 Non-pressure chronic ulcer of other part of left lower leg with fat layer exposed Quantity: 1 Electronic Signature(s) Signed: 08/07/2023 3:01:29 PM By: Duanne Guess MD FACS Entered By: Duanne Guess on 08/07/2023 12:01:29  Week/30 Days Discharge Instructions: As directed by physician Topical: Mupirocin  Ointment 1 x Per Week/30 Days Discharge Instructions: Apply Mupirocin (Bactroban) as instructed Topical: Skintegrity Hydrogel 4 (oz) 1 x Per Week/30 Days Discharge Instructions: Apply hydrogel as directed Prim Dressing: Promogran Prisma Matrix, 4.34 (sq in) (silver collagen) 1 x Per Week/30 Days ary Discharge Instructions: Moisten collagen with saline or hydrogel Secondary Dressing: Optifoam Non-Adhesive Dressing, 4x4 in 1 x Per Week/30 Days Discharge Instructions: Apply over primary dressing as directed. Secondary Dressing: Zetuvit Plus 4x8 in 1 x Per Week/30 Days Discharge Instructions: Apply over primary dressing as directed. Compression Wrap: CoFlex Calamine Unna Boot 4 x 6 (in/yd) 1 x Per Week/30 Days Discharge Instructions: Apply Coflex [employer]amine [employer] as directed. Electronic Signature(s) Signed: 08/07/2023 4:32:07 PM By: Duanne Guess MD FACS Entered By: Duanne Guess on 08/07/2023 12:00:38 Problem List Details -------------------------------------------------------------------------------- [name] ([id number]) [id number] Page 6 of 12 Patient Name: Date of Service: [name], LENGEL 08/07/2023 1:45 PM Medical Record Number: [id number] Patient Account Number: [id number] Date of Birth/Sex: Treating RN: 1946/10/30 (77 y.o. [name] Primary Care Provider: Eustaquio Boyden Other Clinician: Referring Provider: Treating Provider/Extender: Priscille Loveless in Treatment: 52 Active Problems ICD-10 Encounter Code Description Active Date MDM Diagnosis [phone] Non-pressure chronic ulcer of other part of left lower leg with fat layer exposed 07/27/2022 No Yes I83.893 Varicose veins of bilateral lower extremities with other complications 07/27/2022 No Yes I87.2 Venous insufficiency (chronic) (peripheral) 07/27/2022 No Yes G62.9 Polyneuropathy, unspecified 07/27/2022 No Yes R60.0 Localized edema 07/27/2022 No  Yes Inactive Problems ICD-10 Code Description Active Date Inactive Date L03.115 Cellulitis of right lower limb 10/22/2022 10/22/2022 L97.818 Non-pressure chronic ulcer of other part of right lower leg with other specified severity 03/01/2023 10/22/2022 Resolved Problems Electronic Signature(s) Signed: 08/07/2023 2:58:31 PM By: Duanne Guess MD FACS Entered By: Duanne Guess on 08/07/2023 11:58:31 -------------------------------------------------------------------------------- Progress Note Details Patient Name: Date of Service: [name], [name] 08/07/2023 1:45 PM Medical Record Number: [id number] Patient Account Number: [id number] Date of Birth/Sex: Treating RN: [birth year] (77 y.o. F) Primary Care Provider: Eustaquio Boyden Other Clinician: Referring Provider: Treating Provider/Extender: Priscille Loveless in Treatment: 28 Subjective Chief Complaint Information obtained from Patient Patient presents for treatment of an open ulcer due to venous insufficiency DALEYSA, KRISTIANSEN ([id number]) [id number] Page 7 of 12 History of Present Illness (HPI) ADMISSION This is a 77 year old woman with a history of chronic venous insufficiency status post saphenous vein ablations in 2010 and 2016. 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 She presents today with an open venous ulcer on her left lower leg. It has been present for a little over 2 weeks. She saw her primary care provider who apparently swabbed the wound and grew out Pseudomonas. She just completed a course of ciprofloxacin for this. ABI was 0.91. She reports that she has had previous issues with ulcers in this same location, stemming back to a punch biopsy taken by a dermatologist many years ago. She has had several skin substitutes applied to the area that have ultimately resulted in healing on prior occasions. She has 2 small ulcers on her left medial lower  leg. There is slough accumulation in both of them. The more anterior of the 2 is quite small and has some soft slough on the surface, underneath which there is good granulation tissue. The more medial wound also has slough accumulation, but the underlying surface is fairly fibrotic and gritty. This is consistent  Name: Date of Service: [name], [name] 08/07/2023 1:45 PM Medical Record Number: [id number] Patient Account Number: [id number] Date of Birth/Sex: Treating RN: 1946/08/21 (77 y.o. F) Primary Care Provider:  Eustaquio Boyden Other Clinician: Referring Provider: Treating Provider/Extender: Priscille Loveless in Treatment: [address], [place] ([id number]) [id number] Page 11 of 12 Information Obtained From Patient Eyes Medical History: Positive for: Cataracts - L eye Ear/Nose/Mouth/Throat Medical History: Negative for: Chronic sinus problems/congestion; Middle ear problems Hematologic/Lymphatic Medical History: Negative for: Anemia; Human Immunodeficiency Virus; Lymphedema Respiratory Medical History: Positive for: Asthma Past Medical History Notes: OSA on CPAP Cardiovascular Medical History: Positive for: Hypertension Past Medical History Notes: Cronic venous insufficiency 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 Gastrointestinal Medical History: Past Medical History Notes: liver cyst Endocrine Medical History: Negative for: Type I Diabetes; Type II Diabetes Musculoskeletal Medical History: Past Medical History Notes: arthritis Neurologic Medical History: Positive for: Neuropathy - Feet and finger Negative for: Seizure Disorder Past Medical History Notes: restless leg syndrome Oncologic Medical History: Positive for: Received Chemotherapy - 2012; Received Radiation - 2012 HBO Extended History Items Eyes: Cataracts Immunizations Pneumococcal Vaccine: Received Pneumococcal Vaccination: Yes Received Pneumococcal Vaccination On or After 60th Birthday: Yes Implantable Devices No devices added Hospitalization / Surgery History Type of Hospitalization/Surgery RENA, HUNKE ([id number]) [id number] Page 12 of 12 Lower extremity venography 2020 Intravascular ultrasound peripheral vascular intervention melanoma 2011 Family and Social History Cancer: Yes - Mother,Paternal Grandparents; Diabetes: Yes - Siblings; Heart Disease: Yes - Father; Hypertension: Yes - Father;  Former smoker - quit in [employer]) Signed: 08/07/2023 4:32:07 PM By: Duanne Guess MD FACS Entered By: Duanne Guess on 08/07/2023 11:59:53 -------------------------------------------------------------------------------- SuperBill Details Patient Name: Date of Service: [name], [name] 08/07/2023 Medical Record Number: [id number] Patient Account Number: [id number] Date of Birth/Sex: Treating RN: [birth year] (77 y.o. 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 Modifier: d Quantity: 1 Physician Procedures : CPT4 Code Description Modifier [id number] 99214 - WC PHYS LEVEL 4 - EST PT 25 ICD-10 Diagnosis Description L97.822 Non-pressure chronic ulcer of other part of left lower leg with fat layer exposed I83.893 Varicose veins of bilateral lower extremities with  other complications I87.2 Venous insufficiency (chronic) (peripheral) R60.0 Localized edema Quantity: 1 : [id number] 97597 - WC PHYS DEBR WO ANESTH 20 SQ CM ICD-10 Diagnosis Description L97.822 Non-pressure chronic ulcer of other part of left lower leg with fat layer exposed Quantity: 1 Electronic Signature(s) Signed: 08/07/2023 3:01:29 PM By: Duanne Guess MD FACS Entered By: Duanne Guess on 08/07/2023 12:01:29  is a little bit of slough overlying granulation tissue. [name], [name] ([id number]) [id number] Page 4 of 12 07/24/2023: The band of epithelium between the 2 open areas has gotten wider. The more proximal area has more epithelialization around the perimeter. Both have slough on the surface and remain fairly fibrotic. Moisture balance remains good. 08/07/2023: Both open areas are little bit smaller today. The band of epithelium between the 2 continues  to enlarge. There is slough and eschar on both surfaces. Electronic Signature(s) Signed: 08/07/2023 2:59:40 PM By: Duanne Guess MD FACS Entered By: Duanne Guess on 08/07/2023 11:59:40 -------------------------------------------------------------------------------- Physical Exam Details Patient Name: Date of Service: [name], [name] 08/07/2023 1:45 PM Medical Record Number: [id number] Patient Account Number: [id number] Date of Birth/Sex: Treating RN: 08/30/1946 (77 y.o. F) Primary Care Provider: Eustaquio Boyden Other Clinician: Referring Provider: Treating Provider/Extender: Priscille Loveless in Treatment: 95 Constitutional Hypertensive, asymptomatic. . . . no acute distress. Respiratory Normal work of breathing on room air. Notes 08/07/2023: Both open areas are little bit smaller today. The band of epithelium between the 2 continues to enlarge. There is slough and eschar on both surfaces. Electronic Signature(s) Signed: 08/07/2023 3:00:17 PM By: Duanne Guess MD FACS Entered By: Duanne Guess on 08/07/2023 12:00:16 -------------------------------------------------------------------------------- Physician Orders Details Patient Name: Date of Service: [name], [name] 08/07/2023 1:45 PM Medical Record Number: [id number] Patient Account Number: [id number] Date of Birth/Sex: Treating RN: 13-May-1946 (77 y.o. 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 - Dr. Lady Gary Room 3 Return A [name], [name] ([id number]) [id number] Page 5 of 12 Anesthetic Wound #2 Left,Posterior Lower Leg (In clinic) Topical Lidocaine 4% applied to wound bed Bathing/ Shower/ Hygiene May shower with protection but do not get wound dressing(s) wet. Protect dressing(s) with water repellant cover (for example, large plastic bag) or a cast cover and may then take shower. Edema Control - Lymphedema / SCD / Other Left Lower Extremity Lymphedema Pumps. Use Lymphedema pumps on leg(s) 2-3 times a day for 45-60 minutes. If wearing any wraps or hose, do not remove them. Continue exercising as instructed. - Use x1 per day Avoid standing for long periods of time. Exercise regularly Off-Loading Wound #2 Left,Posterior Lower Leg Other: - Elevate legs at heart level or above heart level while sitting. Additional Orders / Instructions Other: - Recommend Nail Care Wound Treatment Wound #2 - Lower Leg Wound Laterality: Left, Posterior Cleanser: Soap and Water 1 x Per Week/30 Days Discharge Instructions: May shower and wash wound with dial antibacterial soap and water prior to dressing change. Peri-Wound Care: Ketoconazole Cream 2% 1 x Per Week/30 Days Discharge Instructions: Apply Ketoconazole as directed Peri-Wound Care: Triamcinolone 15 (g) 1 x Per Week/30 Days Discharge Instructions: Use triamcinolone 15 (g) as directed Peri-Wound Care: Zinc Oxide Ointment 30g tube 1 x Per Week/30 Days Discharge Instructions: Apply Zinc Oxide to periwound with each dressing change Peri-Wound Care: Sween Lotion (Moisturizing lotion) 1 x Per Week/30 Days Discharge Instructions: Apply moisturizing lotion as directed Topical: Gentamicin 1 x Per  JERELENE, SALAAM ([id number]) [id number] Page 1 of 12 Visit Report for 08/07/2023 Chief Complaint Document Details Patient Name: Date of Service: [name], [name] 08/07/2023 1:45 PM Medical Record Number: [id number] Patient Account Number: [id number] Date of Birth/Sex: Treating RN: [birth year] (77 y.o. F) Primary Care Provider: Eustaquio Boyden Other Clinician: Referring Provider: Treating Provider/Extender: Priscille Loveless in Treatment: 23 Information Obtained from: Patient Chief Complaint Patient presents for treatment of an open ulcer due to venous insufficiency Electronic Signature(s) Signed: 08/07/2023 2:58:52 PM By: Duanne Guess MD FACS Entered By: Duanne Guess on 08/07/2023 11:58:51 -------------------------------------------------------------------------------- Debridement Details Patient Name: Date of Service: [name], [name] 08/07/2023 1:45 PM Medical Record Number: [id number] Patient Account Number: [id number] Date of Birth/Sex: Treating RN: 12-07-1945 (77 y.o. [name] Primary Care Provider: Eustaquio Boyden Other Clinician: Referring Provider: Treating Provider/Extender: Priscille Loveless in Treatment: 53 Debridement Performed for Assessment: Wound #2 Left,Posterior Lower Leg Performed By: Physician Duanne Guess, MD The following information was scribed by: Brenton Grills The information was scribed for: Duanne Guess Debridement Type: Debridement Severity of Tissue Pre Debridement: Fat layer exposed Level of Consciousness (Pre-procedure): Awake and Alert Pre-procedure Verification/Time Out Yes - 14:14 Taken: Start Time: 14:15 Pain Control: Lidocaine 4% Topical Solution Percent of Wound Bed Debrided: 100% T Area Debrided (cm): otal 1.96 Tissue and other material debrided: Non-Viable, Eschar, Slough, Slough Level: Non-Viable Tissue Debridement Description:  Selective/Open Wound Instrument: Curette Bleeding: Minimum Hemostasis Achieved: Pressure Response to Treatment: Procedure was tolerated well Level of Consciousness (Post- Awake and Alert procedure): Post Debridement Measurements of Total Wound Length: (cm) 2.5 Width: (cm) 1 Depth: (cm) 0.1 Volume: (cm) 0.196 Character of Wound/Ulcer Post Debridement: Improved Severity of Tissue Post Debridement: Fat layer exposed [name], [name] ([id number]) [id number] Page 2 of 12 Post Procedure Diagnosis Same as Pre-procedure Electronic Signature(s) Signed: 08/07/2023 4:23:32 PM By: Brenton Grills Signed: 08/07/2023 4:32:07 PM By: Duanne Guess MD FACS Entered By: Brenton Grills on 08/07/2023 11:17:14 -------------------------------------------------------------------------------- HPI Details Patient Name: Date of Service: [name], [name] 08/07/2023 1:45 PM Medical Record Number: [id number] Patient Account Number: [id number] Date of Birth/Sex: Treating RN: 01-06-1946 (77 y.o. F) Primary Care Provider: Eustaquio Boyden Other Clinician: Referring Provider: Treating Provider/Extender: Priscille Loveless in Treatment: 51 History of Present Illness HPI Description: ADMISSION This is a 77 year old woman with a history of chronic venous insufficiency status post saphenous vein ablations in 2010 and 2016. 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 She presents today with an open venous ulcer on her left lower leg. It has been present for a little over 2 weeks. She saw her primary care provider who apparently swabbed the wound and grew out Pseudomonas. She just completed a course of ciprofloxacin for this. ABI was 0.91. She reports that she has had previous issues with ulcers in this same location, stemming back to a punch biopsy taken by a dermatologist many years ago. She has had several skin substitutes applied to the area  that have ultimately resulted in healing on prior occasions. She has 2 small ulcers on her left medial lower leg. There is slough accumulation in both of them. The more anterior of the 2 is quite small and has some soft slough on the surface, underneath which there is good granulation tissue. The more medial wound also has slough accumulation, but the underlying surface is fairly fibrotic and gritty. This is consistent with her provided history of multiple ulcers in the same location. 08/03/2022:

## 2023-08-07 NOTE — Progress Notes (Signed)
9 Zetuvit Plus 4x8 in Discharge Instruction: Apply over primary dressing as directed. Secured With Compression Wrap CoFlex Calamine Unna Boot 4 x 6 (in/yd) Discharge Instruction: Apply Coflex Calamine [employer] as directed. Compression Stockings Add-Ons Electronic Signature(s) Signed: 08/07/2023 2:58:41 PM By: Amber Guess MD  FACS Entered By: [name] on 08/07/2023 11:58:41 -------------------------------------------------------------------------------- Multi-Disciplinary Care Plan Details Patient Name: Date of Service: [name], [name] 08/07/2023 1:45 PM Medical Record Number: [id number] Patient Account Number: [id number] Date of Birth/Sex: Treating RN: [name] (77 y.o. [name] Primary Care [name]: [name] Other Clinician: Referring [name]: Treating [name]/Extender: [name] in Treatment: 5 Multidisciplinary Care Plan reviewed with physician Active Inactive Venous Leg Ulcer Nursing Diagnoses: Knowledge deficit related to disease process and management Potential for venous Insuffiency (use before diagnosis confirmed) Goals: Patient will maintain optimal edema control Date Initiated: 02/07/2023 Target Resolution Date: 08/02/2023 Goal Status: Active Interventions: Assess peripheral edema status every visit. Compression as ordered Treatment Activities: Therapeutic compression applied : 02/07/2023 Notes: Wound/Skin Impairment Nursing Diagnoses: Impaired tissue integrity Goals: Ulcer/skin breakdown will have a volume reduction of 30% by week 4 Date Initiated: 08/03/2022 Date Inactivated: 09/10/2022 Target Resolution Date: 08/31/2022 Unmet Reason: uncontrolled edema, Goal Status: Unmet acute infection Ulcer/skin breakdown will have a volume reduction of 50% by week 8 Date Initiated: 09/10/2022 Target Resolution Date: 08/02/2023 Goal Status: Active Interventions: Assess ulceration(s) every visit [name], [name] ([id number]) [id number] Page 6 of 9 Treatment Activities: Skin care regimen initiated : 08/03/2022 Notes: Keystone ordered. 10/30 RX Levo started. 09/10/22 Electronic Signature(s) Signed: 08/07/2023 4:23:32 PM By: Brenton Grills Entered By: Brenton Grills on 08/07/2023  11:10:09 -------------------------------------------------------------------------------- Pain Assessment Details Patient Name: Date of Service: [name], [name] 08/07/2023 1:45 PM Medical Record Number: [id number] Patient Account Number: [id number] Date of Birth/Sex: Treating RN: [name] (77 y.o. [name] Primary Care [name]: [name] Other Clinician: Referring Naphtali Riede: Treating [name]/Extender: [name] in Treatment: 84 Active Problems Location of Pain Severity and Description of Pain Patient Has Paino No Site Locations Pain Management and Medication Current Pain Management: Electronic Signature(s) Signed: 08/07/2023 4:23:32 PM By: Brenton Grills Entered By: Brenton Grills on 08/07/2023 10:50:55 -------------------------------------------------------------------------------- Patient/Caregiver Education Details Patient Name: Date of Service: [name] 10/2/2024andnbsp1:45 PM Medical Record Number: [id number] Patient Account Number: [id number] Date of Birth/Gender: Treating RN: [name] (77 y.o. [name] Primary Care Physician: [name] Other Clinician: Referring Physician: Treating Physician/Extender: [name] in Treatment: [address] [place] ([id number]) [id number] Page 7 of 9 Education Assessment Education Provided To: Patient Education Topics Provided Wound/Skin Impairment: Methods: Explain/Verbal Responses: State content correctly Electronic Signature(s) Signed: 08/07/2023 4:23:32 PM By: Brenton Grills Entered By: Brenton Grills on 08/07/2023 11:10:33 -------------------------------------------------------------------------------- Wound Assessment Details Patient Name: Date of Service: [name], [name] 08/07/2023 1:45 PM Medical Record Number: [id number] Patient Account Number: [id number] Date of Birth/Sex: Treating RN: [birth year]  (77 y.o. [name] Primary Care [name]: [name] Other Clinician: Referring [name]: Treating [name]/Extender: [name] Weeks in Treatment: 53 Wound Status Wound Number: 2 Primary Venous Leg Ulcer Etiology: Wound Location: Left, Posterior Lower Leg Wound Open Wounding Event: Blister Status: Date Acquired: 07/06/2022 Comorbid Cataracts, Asthma, Hypertension, Neuropathy, Received Weeks Of Treatment: 53 History: Chemotherapy, Received Radiation Clustered Wound: No Photos Wound Measurements Length: (cm) 2.5 Width: (cm) 1 Depth: (cm) 0.1 Area: (cm) 1.963 Volume: (cm) 0.196 % Reduction in Area: 71.4% % Reduction in Volume: 71.5% Epithelialization: Small (1-33%) Tunneling: No Undermining: No Wound  [name], [name] ([id number]) [id number] Page 1 of 9 Visit Report for 08/07/2023 Arrival Information Details Patient Name: Date of Service: Amber, [name] 08/07/2023 1:45 PM Medical Record Number: [id number] Patient Account Number: [id number] Date of Birth/Sex: Treating RN: [name] (77 y.o. F) Primary Care Jamond Neels: [name] Other Clinician: Referring [name]: Treating [name]/Extender: [name] in Treatment: 86 Visit Information History Since Last Visit Added or deleted any medications: No Patient Arrived: Ambulatory Any new allergies or adverse reactions: No Arrival Time: 13:48 Had a fall or experienced change in No Accompanied By: self activities of daily living that may affect Transfer Assistance: None risk of falls: Patient Identification Verified: Yes Signs or symptoms of abuse/neglect since last visito No Secondary Verification Process Completed: Yes Hospitalized since last visit: No Patient Requires Transmission-Based Precautions: No Implantable device outside of the clinic excluding No Patient Has Alerts: No cellular tissue based products placed in the center since last visit: Pain Present Now: No Electronic Signature(s) Signed: 08/07/2023 1:55:48 PM By: Dayton Scrape Entered By: Dayton Scrape on 08/07/2023 10:49:16 -------------------------------------------------------------------------------- Compression Therapy Details Patient Name: Date of Service: [name], [name] 08/07/2023 1:45 PM Medical Record Number: [id number] Patient Account Number: [id number] Date of Birth/Sex: Treating RN: [birth year] (77 y.o. [name] Primary Care Rafi Kenneth: [name] Other Clinician: Referring Zakhari Fogel: Treating Lanah Steines/Extender: [name] in Treatment: 01 Compression Therapy Performed for Wound Assessment: Wound #2 Left,Posterior Lower Leg Performed By: Clinician Brenton Grills, RN Compression Type: Three Layer Post Procedure Diagnosis Same as Pre-procedure Electronic Signature(s) Signed: 08/07/2023 4:23:32 PM By: Brenton Grills Entered By: Brenton Grills on 08/07/2023 11:48:09 Compression Therapy Details -------------------------------------------------------------------------------- [name] ([id number]) [id number] Page 2 of 9 Patient Name: Date of Service: [name], [name] 08/07/2023 1:45 PM Medical Record Number: [id number] Patient Account Number: [id number] Date of Birth/Sex: Treating RN: [name] (77 y.o. [name] Primary Care Amylah Will: [name] Other Clinician: Referring Asriel Westrup: Treating Allard Lightsey/Extender: [name] in Treatment: 27 Compression Therapy Performed for Wound Assessment: Non-Wound Location Performed By: Clinician Brenton Grills, RN Compression Type: Three Layer Location: Lower Extremity, Right Post Procedure Diagnosis Same as Pre-procedure Electronic Signature(s) Signed: 08/07/2023 4:23:32 PM By: Brenton Grills Entered By: Brenton Grills on 08/07/2023 11:48:41 -------------------------------------------------------------------------------- Encounter Discharge Information Details Patient Name: Date of Service: [name], [name] 08/07/2023 1:45 PM Medical Record Number: [id number] Patient Account Number: [id number] Date of Birth/Sex: Treating RN: [name] (77 y.o. [name] Primary Care Angele Wiemann: [name] Other Clinician: Referring Jahmiyah Dullea: Treating Chan Sheahan/Extender: [name] in Treatment: 66 Encounter Discharge Information Items Post Procedure Vitals Discharge Condition: Stable Temperature (F): 98.1 Ambulatory Status: Ambulatory Pulse (bpm): 78 Discharge Destination: Home Respiratory Rate (breaths/min): 18 Transportation: Private Auto Blood Pressure (mmHg): 148/70 Accompanied By:  self Schedule Follow-up Appointment: Yes Clinical Summary of Care: Patient Declined Electronic Signature(s) Signed: 08/07/2023 4:23:32 PM By: Brenton Grills Entered By: Brenton Grills on 08/07/2023 11:22:05 -------------------------------------------------------------------------------- Lower Extremity Assessment Details Patient Name: Date of Service: [name], [name] 08/07/2023 1:45 PM Medical Record Number: [id number] Patient Account Number: [id number] Date of Birth/Sex: Treating RN: [birth year] (77 y.o. [name] Primary Care Ayline Dingus: [name] Other Clinician: Referring Tiziana Cislo: Treating Layla Kesling/Extender: [name] Weeks in Treatment: 53 Edema Assessment Assessed: [Left: No] [Right: No] Edema: [Left: Ye] [Right: s] Calf Left: Right: Point of Measurement: 31 cm From Medial Instep 46.1 cm MARYCRUZ, BOEHNER ([id number]) [id number] Page 3 of 9 Ankle Left: Right: Point of Measurement: 9 cm From  Medial Instep 23.3 cm Vascular Assessment Pulses: Dorsalis Pedis Palpable: [Left:Yes] Extremity colors, hair growth, and conditions: Extremity Color: [Left:Normal] Hair Growth on Extremity: [Left:Yes] Temperature of Extremity: [Left:Warm] Capillary Refill: [Left:< 3 seconds] Dependent Rubor: [Left:No No] Toe Nail Assessment Left: Right: Thick: Yes Discolored: Yes Deformed: Yes Improper Length and Hygiene: Yes Electronic Signature(s) Signed: 08/07/2023 4:23:32 PM By: Brenton Grills Entered By: Brenton Grills on 08/07/2023 11:02:41 -------------------------------------------------------------------------------- Multi Wound Chart Details Patient Name: Date of Service: [name] 08/07/2023 1:45 PM Medical Record Number: [id number] Patient Account Number: [id number] Date of Birth/Sex: Treating RN: [name] (77 y.o. F) Primary Care Korbin Mapps: [name] Other Clinician: Referring Curlie Macken: Treating  Andersen Iorio/Extender: [name] in Treatment: 53 Vital Signs Height(in): 63 Pulse(bpm): 76 Weight(lbs): 200 Blood Pressure(mmHg): 174/84 Body Mass Index(BMI): 35.4 Temperature(F): 97.9 Respiratory Rate(breaths/min): 18 [2:Photos:] [N/A:N/A] Left, Posterior Lower Leg N/A N/A Wound Location: Blister N/A N/A Wounding Event: Venous Leg Ulcer N/A N/A Primary Etiology: Cataracts, Asthma, Hypertension, N/A N/A Comorbid History: Neuropathy, Received Chemotherapy, Received Radiation 07/06/2022 N/A N/A Date Acquired: 21 N/A N/A Weeks of Treatment: Open N/A N/A Wound Status: No N/A N/A Wound Recurrence: [name], [name] ([id number]) [id number] Page 4 of 9 2.5x1x0.1 N/A N/A Measurements L x W x D (cm) 1.963 N/A N/A A (cm) : rea 0.196 N/A N/A Volume (cm) : 71.40% N/A N/A % Reduction in Area: 71.50% N/A N/A % Reduction in Volume: Full Thickness Without Exposed N/A N/A Classification: Support Structures Medium N/A N/A Exudate A mount: Serosanguineous N/A N/A Exudate Type: red, brown N/A N/A Exudate Color: Distinct, outline attached N/A N/A Wound Margin: Small (1-33%) N/A N/A Granulation A mount: Red N/A N/A Granulation Quality: Large (67-100%) N/A N/A Necrotic A mount: Eschar, Adherent Slough N/A N/A Necrotic Tissue: Fat Layer (Subcutaneous Tissue): Yes N/A N/A Exposed Structures: Fascia: No Tendon: No Muscle: No Joint: No Bone: No Small (1-33%) N/A N/A Epithelialization: Debridement - Selective/Open Wound N/A N/A Debridement: Pre-procedure Verification/Time Out 14:14 N/A N/A Taken: Lidocaine 4% Topical Solution N/A N/A Pain Control: Necrotic/Eschar, Slough N/A N/A Tissue Debrided: Non-Viable Tissue N/A N/A Level: 1.96 N/A N/A Debridement A (sq cm): rea Curette N/A N/A Instrument: Minimum N/A N/A Bleeding: Pressure N/A N/A Hemostasis A chieved: Procedure was tolerated well N/A N/A Debridement  Treatment Response: 2.5x1x0.1 N/A N/A Post Debridement Measurements L x W x D (cm) 0.196 N/A N/A Post Debridement Volume: (cm) Scarring: Yes N/A N/A Periwound Skin Texture: Maceration: No N/A N/A Periwound Skin Moisture: Dry/Scaly: No Rubor: Yes N/A N/A Periwound Skin Color: No Abnormality N/A N/A Temperature: Yes N/A N/A Tenderness on Palpation: Compression Therapy N/A N/A Procedures Performed: Debridement Treatment Notes Wound #2 (Lower Leg) Wound Laterality: Left, Posterior Cleanser Soap and Water Discharge Instruction: May shower and wash wound with dial antibacterial soap and water prior to dressing change. Peri-Wound Care Ketoconazole Cream 2% Discharge Instruction: Apply Ketoconazole as directed Triamcinolone 15 (g) Discharge Instruction: Use triamcinolone 15 (g) as directed Zinc Oxide Ointment 30g tube Discharge Instruction: Apply Zinc Oxide to periwound with each dressing change Sween Lotion (Moisturizing lotion) Discharge Instruction: Apply moisturizing lotion as directed Topical Gentamicin Discharge Instruction: As directed by physician Mupirocin Ointment Discharge Instruction: Apply Mupirocin (Bactroban) as instructed Skintegrity Hydrogel 4 (oz) Discharge Instruction: Apply hydrogel as directed Primary Dressing Promogran Prisma Matrix, 4.34 (sq in) (silver collagen) Discharge Instruction: Moisten collagen with saline or hydrogel Secondary Dressing Optifoam Non-Adhesive Dressing, 4x4 in Discharge Instruction: Apply over primary dressing as directed. [name], [name] ([id number]) [id number] Page 5 of  9 Zetuvit Plus 4x8 in Discharge Instruction: Apply over primary dressing as directed. Secured With Compression Wrap CoFlex Calamine Unna Boot 4 x 6 (in/yd) Discharge Instruction: Apply Coflex Calamine [employer] as directed. Compression Stockings Add-Ons Electronic Signature(s) Signed: 08/07/2023 2:58:41 PM By: Amber Guess MD  FACS Entered By: [name] on 08/07/2023 11:58:41 -------------------------------------------------------------------------------- Multi-Disciplinary Care Plan Details Patient Name: Date of Service: [name], [name] 08/07/2023 1:45 PM Medical Record Number: [id number] Patient Account Number: [id number] Date of Birth/Sex: Treating RN: [name] (77 y.o. [name] Primary Care [name]: [name] Other Clinician: Referring [name]: Treating [name]/Extender: [name] in Treatment: 5 Multidisciplinary Care Plan reviewed with physician Active Inactive Venous Leg Ulcer Nursing Diagnoses: Knowledge deficit related to disease process and management Potential for venous Insuffiency (use before diagnosis confirmed) Goals: Patient will maintain optimal edema control Date Initiated: 02/07/2023 Target Resolution Date: 08/02/2023 Goal Status: Active Interventions: Assess peripheral edema status every visit. Compression as ordered Treatment Activities: Therapeutic compression applied : 02/07/2023 Notes: Wound/Skin Impairment Nursing Diagnoses: Impaired tissue integrity Goals: Ulcer/skin breakdown will have a volume reduction of 30% by week 4 Date Initiated: 08/03/2022 Date Inactivated: 09/10/2022 Target Resolution Date: 08/31/2022 Unmet Reason: uncontrolled edema, Goal Status: Unmet acute infection Ulcer/skin breakdown will have a volume reduction of 50% by week 8 Date Initiated: 09/10/2022 Target Resolution Date: 08/02/2023 Goal Status: Active Interventions: Assess ulceration(s) every visit [name], [name] ([id number]) [id number] Page 6 of 9 Treatment Activities: Skin care regimen initiated : 08/03/2022 Notes: Keystone ordered. 10/30 RX Levo started. 09/10/22 Electronic Signature(s) Signed: 08/07/2023 4:23:32 PM By: Brenton Grills Entered By: Brenton Grills on 08/07/2023  11:10:09 -------------------------------------------------------------------------------- Pain Assessment Details Patient Name: Date of Service: [name], [name] 08/07/2023 1:45 PM Medical Record Number: [id number] Patient Account Number: [id number] Date of Birth/Sex: Treating RN: [name] (77 y.o. [name] Primary Care [name]: [name] Other Clinician: Referring Naphtali Riede: Treating [name]/Extender: [name] in Treatment: 84 Active Problems Location of Pain Severity and Description of Pain Patient Has Paino No Site Locations Pain Management and Medication Current Pain Management: Electronic Signature(s) Signed: 08/07/2023 4:23:32 PM By: Brenton Grills Entered By: Brenton Grills on 08/07/2023 10:50:55 -------------------------------------------------------------------------------- Patient/Caregiver Education Details Patient Name: Date of Service: [name] 10/2/2024andnbsp1:45 PM Medical Record Number: [id number] Patient Account Number: [id number] Date of Birth/Gender: Treating RN: [name] (77 y.o. [name] Primary Care Physician: [name] Other Clinician: Referring Physician: Treating Physician/Extender: [name] in Treatment: [address] [place] ([id number]) [id number] Page 7 of 9 Education Assessment Education Provided To: Patient Education Topics Provided Wound/Skin Impairment: Methods: Explain/Verbal Responses: State content correctly Electronic Signature(s) Signed: 08/07/2023 4:23:32 PM By: Brenton Grills Entered By: Brenton Grills on 08/07/2023 11:10:33 -------------------------------------------------------------------------------- Wound Assessment Details Patient Name: Date of Service: [name], [name] 08/07/2023 1:45 PM Medical Record Number: [id number] Patient Account Number: [id number] Date of Birth/Sex: Treating RN: [birth year]  (77 y.o. [name] Primary Care [name]: [name] Other Clinician: Referring [name]: Treating [name]/Extender: [name] Weeks in Treatment: 53 Wound Status Wound Number: 2 Primary Venous Leg Ulcer Etiology: Wound Location: Left, Posterior Lower Leg Wound Open Wounding Event: Blister Status: Date Acquired: 07/06/2022 Comorbid Cataracts, Asthma, Hypertension, Neuropathy, Received Weeks Of Treatment: 53 History: Chemotherapy, Received Radiation Clustered Wound: No Photos Wound Measurements Length: (cm) 2.5 Width: (cm) 1 Depth: (cm) 0.1 Area: (cm) 1.963 Volume: (cm) 0.196 % Reduction in Area: 71.4% % Reduction in Volume: 71.5% Epithelialization: Small (1-33%) Tunneling: No Undermining: No Wound

## 2023-08-14 ENCOUNTER — Encounter (HOSPITAL_BASED_OUTPATIENT_CLINIC_OR_DEPARTMENT_OTHER): Payer: Medicare Other | Admitting: General Surgery

## 2023-08-14 DIAGNOSIS — Z86718 Personal history of other venous thrombosis and embolism: Secondary | ICD-10-CM | POA: Diagnosis not present

## 2023-08-14 DIAGNOSIS — L97222 Non-pressure chronic ulcer of left calf with fat layer exposed: Secondary | ICD-10-CM | POA: Diagnosis not present

## 2023-08-14 DIAGNOSIS — L97822 Non-pressure chronic ulcer of other part of left lower leg with fat layer exposed: Secondary | ICD-10-CM | POA: Diagnosis not present

## 2023-08-14 DIAGNOSIS — I871 Compression of vein: Secondary | ICD-10-CM | POA: Diagnosis not present

## 2023-08-14 DIAGNOSIS — I872 Venous insufficiency (chronic) (peripheral): Secondary | ICD-10-CM | POA: Diagnosis not present

## 2023-08-14 DIAGNOSIS — I83893 Varicose veins of bilateral lower extremities with other complications: Secondary | ICD-10-CM | POA: Diagnosis not present

## 2023-08-14 DIAGNOSIS — I10 Essential (primary) hypertension: Secondary | ICD-10-CM | POA: Diagnosis not present

## 2023-08-15 NOTE — Progress Notes (Signed)
Amber, [name] ([id number]) [id number] Page 1 of 8 Visit Report for 08/14/2023 Arrival Information Details Patient Name: Date of Service: Amber, [name] 08/14/2023 1:45 PM Medical Record Number: [id number] Patient Account Number: [id number] Date of Birth/Sex: Treating RN: [name] 10, 1946 (77 y.o. [name] Primary Care Monseratt Ledin: Eustaquio Boyden Other Clinician: Referring Kashif Pooler: Treating Tallyn Holroyd/Extender: Priscille Loveless in Treatment: 89 Visit Information History Since Last Visit All ordered tests and consults were completed: Yes Patient Arrived: Ambulatory Added or deleted any medications: No Arrival Time: 14:03 Any new allergies or adverse reactions: No Accompanied By: self Had a fall or experienced change in No Transfer Assistance: None activities of daily living that may affect Patient Requires Transmission-Based Precautions: No risk of falls: Patient Has Alerts: No Signs or symptoms of abuse/neglect since last visito No Hospitalized since last visit: No Implantable device outside of the clinic excluding No cellular tissue based products placed in the center since last visit: Has Dressing in Place as Prescribed: Yes Pain Present Now: No Electronic Signature(s) Signed: 08/15/2023 4:20:59 PM By: Brenton Grills Entered By: Brenton Grills on 08/14/2023 14:08:45 -------------------------------------------------------------------------------- Encounter Discharge Information Details Patient Name: Date of Service: Amber, [name] 08/14/2023 1:45 PM Medical Record Number: [id number] Patient Account Number: [id number] Date of Birth/Sex: Treating RN: January 20, 1946 (77 y.o. [name] Primary Care Damontae Loppnow: Eustaquio Boyden Other Clinician: Referring Tawnie Ehresman: Treating Keyontay Stolz/Extender: Priscille Loveless in Treatment: 77 Encounter Discharge Information Items Post Procedure Vitals Discharge  Condition: Stable Temperature (F): 97.9 Ambulatory Status: Ambulatory Pulse (bpm): 82 Discharge Destination: Home Respiratory Rate (breaths/min): 18 Transportation: Private Auto Blood Pressure (mmHg): 122/70 Accompanied By: self Schedule Follow-up Appointment: Yes Clinical Summary of Care: Patient Declined Electronic Signature(s) Signed: 08/15/2023 4:20:59 PM By: Brenton Grills Entered By: Brenton Grills on 08/14/2023 14:51:11 [name] ([id number]) [id number] Page 2 of 8 -------------------------------------------------------------------------------- Lower Extremity Assessment Details Patient Name: Date of Service: Amber, [name] 08/14/2023 1:45 PM Medical Record Number: [id number] Patient Account Number: [id number] Date of Birth/Sex: Treating RN: [birth year] (77 y.o. [name] Primary Care Kayler Buckholtz: Eustaquio Boyden Other Clinician: Referring Sacha Topor: Treating Bruna Dills/Extender: Priscille Loveless in Treatment: 54 Edema Assessment Assessed: [name]: No] Franne Forts: No] Edema: [Left: Ye] [Right: s] Calf Left: Right: Point of Measurement: 31 cm From Medial Instep 46.1 cm Ankle Left: Right: Point of Measurement: 9 cm From Medial Instep 23.3 cm Vascular Assessment Pulses: Dorsalis Pedis Palpable: [Left:Yes] Extremity colors, hair growth, and conditions: Extremity Color: [Left:Normal] Hair Growth on Extremity: [Left:Yes] Temperature of Extremity: [Left:Warm] Capillary Refill: [Left:< 3 seconds] Dependent Rubor: [Left:No No] Toe Nail Assessment Left: Right: Thick: Yes Discolored: Yes Deformed: Yes Improper Length and Hygiene: Yes Electronic Signature(s) Signed: 08/15/2023 4:20:59 PM By: Brenton Grills Entered By: Brenton Grills on 08/14/2023 14:20:19 -------------------------------------------------------------------------------- Multi Wound Chart Details Patient Name: Date of Service: Amber, [name]  08/14/2023 1:45 PM Medical Record Number: [id number] Patient Account Number: [id number] Date of Birth/Sex: Treating RN: March 03, 1946 (77 y.o. F) Primary Care Eldine Rencher: Eustaquio Boyden Other Clinician: Referring Jeren Dufrane: Treating Venkat Ankney/Extender: Priscille Loveless in Treatment: 54 Vital Signs Height(in): 63 Pulse(bpm): 80 Weight(lbs): 200 Blood Pressure(mmHg): 170/85 Body Mass Index(BMI): 35.4 AHONESTY, WOODFIN ([id number]) [phone].pdf Page 3 of 8 Temperature(F): 98 Respiratory Rate(breaths/min): 18 [2:Photos:] [N/A:N/A] Left, Posterior Lower Leg N/A N/A Wound Location: Blister N/A N/A Wounding Event: Venous Leg Ulcer N/A N/A Primary Etiology: Cataracts, Asthma, Hypertension, N/A N/A Comorbid History: Neuropathy, Received Chemotherapy, Received Radiation 07/06/2022 N/A N/A Date Acquired: 96 N/A N/A Weeks  of Treatment: Open N/A N/A Wound Status: No N/A N/A Wound Recurrence: 3.2x2.5x0.2 N/A N/A Measurements L x W x D (cm) 6.283 N/A N/A A (cm) : rea 1.257 N/A N/A Volume (cm) : 8.60% N/A N/A % Reduction in A rea: -83.00% N/A N/A % Reduction in Volume: Full Thickness Without Exposed N/A N/A Classification: Support Structures Medium N/A N/A Exudate A mount: Serosanguineous N/A N/A Exudate Type: red, brown N/A N/A Exudate Color: Distinct, outline attached N/A N/A Wound Margin: Small (1-33%) N/A N/A Granulation A mount: Red N/A N/A Granulation Quality: Large (67-100%) N/A N/A Necrotic A mount: Eschar, Adherent Slough N/A N/A Necrotic Tissue: Fat Layer (Subcutaneous Tissue): Yes N/A N/A Exposed Structures: Fascia: No Tendon: No Muscle: No Joint: No Bone: No Small (1-33%) N/A N/A Epithelialization: Debridement - Selective/Open Wound N/A N/A Debridement: Pre-procedure Verification/Time Out 14:36 N/A N/A Taken: Lidocaine 4% Topical Solution N/A N/A Pain Control: Necrotic/Eschar, Slough N/A N/A Tissue  Debrided: Non-Viable Tissue N/A N/A Level: 6.28 N/A N/A Debridement A (sq cm): rea Curette N/A N/A Instrument: Minimum N/A N/A Bleeding: Pressure N/A N/A Hemostasis A chieved: Procedure was tolerated well N/A N/A Debridement Treatment Response: 3.2x2.5x0.2 N/A N/A Post Debridement Measurements L x W x D (cm) 1.257 N/A N/A Post Debridement Volume: (cm) Scarring: Yes N/A N/A Periwound Skin Texture: Maceration: No N/A N/A Periwound Skin Moisture: Dry/Scaly: No Rubor: Yes N/A N/A Periwound Skin Color: No Abnormality N/A N/A Temperature: Yes N/A N/A Tenderness on Palpation: Debridement N/A N/A Procedures Performed: Treatment Notes Electronic Signature(s) Signed: 08/14/2023 2:50:15 PM By: Duanne Guess MD FACS Entered By: Duanne Guess on 08/14/2023 14:50:15 [name] ([id number]) [id number] Page 4 of 8 -------------------------------------------------------------------------------- Multi-Disciplinary Care Plan Details Patient Name: Date of Service: Amber, [name] 08/14/2023 1:45 PM Medical Record Number: [id number] Patient Account Number: [id number] Date of Birth/Sex: Treating RN: Apr 24, 1946 (77 y.o. [name] Primary Care Savon Bordonaro: Eustaquio Boyden Other Clinician: Referring Pranathi Winfree: Treating Sreekar Broyhill/Extender: Priscille Loveless in Treatment: 89 Multidisciplinary Care Plan reviewed with physician Active Inactive Venous Leg Ulcer Nursing Diagnoses: Knowledge deficit related to disease process and management Potential for venous Insuffiency (use before diagnosis confirmed) Goals: Patient will maintain optimal edema control Date Initiated: 02/07/2023 Target Resolution Date: 08/02/2023 Goal Status: Active Interventions: Assess peripheral edema status every visit. Compression as ordered Treatment Activities: Therapeutic compression applied : 02/07/2023 Notes: Wound/Skin Impairment Nursing  Diagnoses: Impaired tissue integrity Goals: Ulcer/skin breakdown will have a volume reduction of 30% by week 4 Date Initiated: 08/03/2022 Date Inactivated: 09/10/2022 Target Resolution Date: 08/31/2022 Unmet Reason: uncontrolled edema, Goal Status: Unmet acute infection Ulcer/skin breakdown will have a volume reduction of 50% by week 8 Date Initiated: 09/10/2022 Target Resolution Date: 08/02/2023 Goal Status: Active Interventions: Assess ulceration(s) every visit Treatment Activities: Skin care regimen initiated : 08/03/2022 Notes: Keystone ordered. 10/30 RX Levo started. 09/10/22 Electronic Signature(s) Signed: 08/15/2023 4:20:59 PM By: Brenton Grills Entered By: Brenton Grills on 08/14/2023 14:24:08 Pain Assessment Details -------------------------------------------------------------------------------- [name] ([id number]) [id number] Page 5 of 8 Patient Name: Date of Service: Amber, [name] 08/14/2023 1:45 PM Medical Record Number: [id number] Patient Account Number: [id number] Date of Birth/Sex: Treating RN: 26-Mar-1946 (77 y.o. [name] Primary Care Chiquetta Langner: Eustaquio Boyden Other Clinician: Referring Ramie Palladino: Treating Aysa Larivee/Extender: Priscille Loveless in Treatment: 70 Active Problems Location of Pain Severity and Description of Pain Patient Has Paino No Site Locations Pain Management and Medication Current Pain Management: Electronic Signature(s) Signed: 08/15/2023 4:20:59 PM By: Brenton Grills Entered By: Brenton Grills  on 08/14/2023 14:09:45 -------------------------------------------------------------------------------- Patient/Caregiver Education Details Patient Name: Date of Service: Amber, [name] 10/9/2024andnbsp1:45 PM Medical Record Number: [id number] Patient Account Number: [id number] Date of Birth/Gender: Treating RN: 06/09/1946 (77 y.o. [name] Primary Care Physician: Eustaquio Boyden Other Clinician: Referring Physician: Treating Physician/Extender: Priscille Loveless in Treatment: 39 Education Assessment Education Provided To: Patient Education Topics Provided Wound/Skin Impairment: Methods: Explain/Verbal Responses: State content correctly Electronic Signature(s) Signed: 08/15/2023 4:20:59 PM By: Brenton Grills Entered By: Brenton Grills on 08/14/2023 14:24:26 [name] ([id number]) [id number] Page 6 of 8 -------------------------------------------------------------------------------- Wound Assessment Details Patient Name: Date of Service: Amber, [name] 08/14/2023 1:45 PM Medical Record Number: [id number] Patient Account Number: [id number] Date of Birth/Sex: Treating RN: June 14, 1946 (77 y.o. [name] Primary Care Shamon Lobo: Eustaquio Boyden Other Clinician: Referring Sherryann Frese: Treating Zyiere Rosemond/Extender: Katharina Caper Weeks in Treatment: 54 Wound Status Wound Number: 2 Primary Venous Leg Ulcer Etiology: Wound Location: Left, Posterior Lower Leg Wound Open Wounding Event: Blister Status: Date Acquired: 07/06/2022 Comorbid Cataracts, Asthma, Hypertension, Neuropathy, Received Weeks Of Treatment: 54 History: Chemotherapy, Received Radiation Clustered Wound: No Photos Wound Measurements Length: (cm) 3.2 Width: (cm) 2.5 Depth: (cm) 0.2 Area: (cm) 6.283 Volume: (cm) 1.257 % Reduction in Area: 8.6% % Reduction in Volume: -83% Epithelialization: Small (1-33%) Tunneling: No Undermining: No Wound Description Classification: Full Thickness Without Exposed Suppor Wound Margin: Distinct, outline attached Exudate Amount: Medium Exudate Type: Serosanguineous Exudate Color: red, brown t Structures Foul Odor After Cleansing: No Slough/Fibrino Yes Wound Bed Granulation Amount: Small (1-33%) Exposed Structure Granulation Quality: Red Fascia Exposed: No Necrotic  Amount: Large (67-100%) Fat Layer (Subcutaneous Tissue) Exposed: Yes Necrotic Quality: Eschar, Adherent Slough Tendon Exposed: No Muscle Exposed: No Joint Exposed: No Bone Exposed: No Periwound Skin Texture Texture Color No Abnormalities Noted: Yes No Abnormalities Noted: No Rubor: Yes Moisture No Abnormalities Noted: Yes Temperature / Pain Temperature: No Abnormality Tenderness on Palpation: Yes Treatment Notes Wound #2 (Lower Leg) Wound Laterality: Left, Posterior Cleanser LILYANNE, MCQUOWN ([id number]) [id number] Page 7 of 8 Soap and Water Discharge Instruction: May shower and wash wound with dial antibacterial soap and water prior to dressing change. Peri-Wound Care Ketoconazole Cream 2% Discharge Instruction: Apply Ketoconazole as directed Triamcinolone 15 (g) Discharge Instruction: Use triamcinolone 15 (g) as directed Zinc Oxide Ointment 30g tube Discharge Instruction: Apply Zinc Oxide to periwound with each dressing change Sween Lotion (Moisturizing lotion) Discharge Instruction: Apply moisturizing lotion as directed Topical Gentamicin Discharge Instruction: As directed by physician Mupirocin Ointment Discharge Instruction: Apply Mupirocin (Bactroban) as instructed Skintegrity Hydrogel 4 (oz) Discharge Instruction: Apply hydrogel as directed Primary Dressing Promogran Prisma Matrix, 4.34 (sq in) (silver collagen) Discharge Instruction: Moisten collagen with saline or hydrogel Secondary Dressing Optifoam Non-Adhesive Dressing, 4x4 in Discharge Instruction: Apply over primary dressing as directed. Zetuvit Plus 4x8 in Discharge Instruction: Apply over primary dressing as directed. Secured With Compression Wrap CoFlex Calamine Unna Boot 4 x 6 (in/yd) Discharge Instruction: Apply Coflex Calamine [employer] as directed. Compression Stockings Add-Ons Electronic Signature(s) Signed: 08/15/2023 4:20:59 PM By: Brenton Grills Entered By:  Brenton Grills on 08/14/2023 14:22:49 -------------------------------------------------------------------------------- Vitals Details Patient Name: Date of Service: Amber, [name] 08/14/2023 1:45 PM Medical Record Number: [id number] Patient Account Number: [id number] Date of Birth/Sex: Treating RN: [birth year] (77 y.o. [name] Primary Care Kerri-Anne Haeberle: Eustaquio Boyden Other Clinician: Referring Exie Chrismer: Treating Giankarlo Leamer/Extender: Priscille Loveless in Treatment: 32 Vital Signs Time Taken: 14:08 Temperature (F): 98 Height (in): 63 Pulse (bpm): 80  Amber, [name] ([id number]) [id number] Page 1 of 8 Visit Report for 08/14/2023 Arrival Information Details Patient Name: Date of Service: Amber, [name] 08/14/2023 1:45 PM Medical Record Number: [id number] Patient Account Number: [id number] Date of Birth/Sex: Treating RN: [name] 10, 1946 (77 y.o. [name] Primary Care Monseratt Ledin: Eustaquio Boyden Other Clinician: Referring Kashif Pooler: Treating Tallyn Holroyd/Extender: Priscille Loveless in Treatment: 89 Visit Information History Since Last Visit All ordered tests and consults were completed: Yes Patient Arrived: Ambulatory Added or deleted any medications: No Arrival Time: 14:03 Any new allergies or adverse reactions: No Accompanied By: self Had a fall or experienced change in No Transfer Assistance: None activities of daily living that may affect Patient Requires Transmission-Based Precautions: No risk of falls: Patient Has Alerts: No Signs or symptoms of abuse/neglect since last visito No Hospitalized since last visit: No Implantable device outside of the clinic excluding No cellular tissue based products placed in the center since last visit: Has Dressing in Place as Prescribed: Yes Pain Present Now: No Electronic Signature(s) Signed: 08/15/2023 4:20:59 PM By: Brenton Grills Entered By: Brenton Grills on 08/14/2023 14:08:45 -------------------------------------------------------------------------------- Encounter Discharge Information Details Patient Name: Date of Service: Amber, [name] 08/14/2023 1:45 PM Medical Record Number: [id number] Patient Account Number: [id number] Date of Birth/Sex: Treating RN: January 20, 1946 (77 y.o. [name] Primary Care Damontae Loppnow: Eustaquio Boyden Other Clinician: Referring Tawnie Ehresman: Treating Keyontay Stolz/Extender: Priscille Loveless in Treatment: 77 Encounter Discharge Information Items Post Procedure Vitals Discharge  Condition: Stable Temperature (F): 97.9 Ambulatory Status: Ambulatory Pulse (bpm): 82 Discharge Destination: Home Respiratory Rate (breaths/min): 18 Transportation: Private Auto Blood Pressure (mmHg): 122/70 Accompanied By: self Schedule Follow-up Appointment: Yes Clinical Summary of Care: Patient Declined Electronic Signature(s) Signed: 08/15/2023 4:20:59 PM By: Brenton Grills Entered By: Brenton Grills on 08/14/2023 14:51:11 [name] ([id number]) [id number] Page 2 of 8 -------------------------------------------------------------------------------- Lower Extremity Assessment Details Patient Name: Date of Service: Amber, [name] 08/14/2023 1:45 PM Medical Record Number: [id number] Patient Account Number: [id number] Date of Birth/Sex: Treating RN: [birth year] (77 y.o. [name] Primary Care Kayler Buckholtz: Eustaquio Boyden Other Clinician: Referring Sacha Topor: Treating Bruna Dills/Extender: Priscille Loveless in Treatment: 54 Edema Assessment Assessed: [name]: No] Franne Forts: No] Edema: [Left: Ye] [Right: s] Calf Left: Right: Point of Measurement: 31 cm From Medial Instep 46.1 cm Ankle Left: Right: Point of Measurement: 9 cm From Medial Instep 23.3 cm Vascular Assessment Pulses: Dorsalis Pedis Palpable: [Left:Yes] Extremity colors, hair growth, and conditions: Extremity Color: [Left:Normal] Hair Growth on Extremity: [Left:Yes] Temperature of Extremity: [Left:Warm] Capillary Refill: [Left:< 3 seconds] Dependent Rubor: [Left:No No] Toe Nail Assessment Left: Right: Thick: Yes Discolored: Yes Deformed: Yes Improper Length and Hygiene: Yes Electronic Signature(s) Signed: 08/15/2023 4:20:59 PM By: Brenton Grills Entered By: Brenton Grills on 08/14/2023 14:20:19 -------------------------------------------------------------------------------- Multi Wound Chart Details Patient Name: Date of Service: Amber, [name]  08/14/2023 1:45 PM Medical Record Number: [id number] Patient Account Number: [id number] Date of Birth/Sex: Treating RN: March 03, 1946 (77 y.o. F) Primary Care Eldine Rencher: Eustaquio Boyden Other Clinician: Referring Jeren Dufrane: Treating Venkat Ankney/Extender: Priscille Loveless in Treatment: 54 Vital Signs Height(in): 63 Pulse(bpm): 80 Weight(lbs): 200 Blood Pressure(mmHg): 170/85 Body Mass Index(BMI): 35.4 AHONESTY, WOODFIN ([id number]) [phone].pdf Page 3 of 8 Temperature(F): 98 Respiratory Rate(breaths/min): 18 [2:Photos:] [N/A:N/A] Left, Posterior Lower Leg N/A N/A Wound Location: Blister N/A N/A Wounding Event: Venous Leg Ulcer N/A N/A Primary Etiology: Cataracts, Asthma, Hypertension, N/A N/A Comorbid History: Neuropathy, Received Chemotherapy, Received Radiation 07/06/2022 N/A N/A Date Acquired: 96 N/A N/A Weeks

## 2023-08-15 NOTE — Progress Notes (Signed)
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 Gastrointestinal Medical History: Past Medical History Notes: liver cyst Endocrine Medical History: Negative for: Type I Diabetes; Type II Diabetes Musculoskeletal Medical History: Past Medical History Notes: arthritis Neurologic Medical History: Positive for: Neuropathy - Feet and finger Negative for: Seizure Disorder Past Medical History Notes: restless leg syndrome Oncologic Medical History: Positive for: Received Chemotherapy - 2012; Received Radiation - 2012 HBO Extended History Items Eyes: Cataracts Immunizations Pneumococcal Vaccine: Received Pneumococcal Vaccination: Yes Received Pneumococcal Vaccination On or After 60th Birthday: Yes Implantable Devices No devices added Hospitalization / Surgery History Type of Hospitalization/Surgery Lower extremity venography 2020 Intravascular ultrasound peripheral vascular intervention melanoma 2011 Family and Social History Cancer: Yes - Mother,Paternal Grandparents; Diabetes: Yes - Siblings; Heart Disease: Yes - Father; Hypertension: Yes - Father; Former smoker - quit in [employer]) Signed: 08/15/2023 7:55:21 AM By: Amber Guess MD FACS Entered By: [name] on 08/14/2023 14:51:03 [name] ([id number]) [id number] Page 12 of 12 -------------------------------------------------------------------------------- SuperBill Details Patient Name: Date of Service: [name], [name] 08/14/2023 Medical Record Number: [id number] Patient Account Number: [id number] Date of Birth/Sex: Treating RN: [birth year] (77 y.o. F) Primary Care Provider: Eustaquio [name] Other Clinician: Referring Provider: Treating Provider/Extender: [name] in Treatment: 54 Diagnosis Coding ICD-10 Codes Code Description [id number] Non-pressure chronic ulcer of other part of left lower leg with fat layer exposed I83.893 Varicose veins of bilateral lower extremities with other complications I87.2 Venous insufficiency (chronic) (peripheral) G62.9 Polyneuropathy, unspecified R60.0 Localized edema Facility Procedures : CPT4 Code: [id number] Description: 97597 - DEBRIDE WOUND 1ST 20 SQ CM OR < ICD-10 Diagnosis Description L97.822 Non-pressure chronic ulcer of other part of left lower leg with fat layer expose Modifier: d Quantity: 1 Physician Procedures : CPT4 Code Description Modifier [id number] 99214 - WC PHYS LEVEL 4 - EST PT 25 ICD-10 Diagnosis Description L97.822 Non-pressure chronic ulcer of other part of left lower leg with fat layer exposed I83.893 Varicose veins of bilateral lower extremities with  other complications I87.2 Venous insufficiency (chronic) (peripheral) R60.0 Localized edema Quantity: 1 : [id number] 97597 - WC PHYS DEBR WO ANESTH 20 SQ CM ICD-10 Diagnosis Description L97.822 Non-pressure chronic ulcer of other part of left lower leg with fat layer exposed Quantity: 1 Electronic Signature(s) Signed: 08/14/2023 2:53:10 PM By: Amber Guess MD FACS Entered By: [name] on 08/14/2023 14:53:10  x 0.2cm depth; 1.257cm^3 volume. Character of Wound/Ulcer Post Debridement is stable. Severity of Tissue Post Debridement is: Fat layer exposed. Post procedure Diagnosis Wound #2: Same as Pre-Procedure Plan Follow-up Appointments: Return Appointment in 1 week. - Dr. Lady Gary Room 3 Anesthetic: Wound #2 Left,Posterior Lower Leg: (In clinic) Topical Lidocaine 4% applied to wound  bed Bathing/ Shower/ Hygiene: May shower with protection but do not get wound dressing(s) wet. Protect dressing(s) with water repellant cover (for example, large plastic bag) or a cast cover and may then take shower. Edema Control - Lymphedema / SCD / OtherRICHANDA, [name] ([id number]) [id number] Page 10 of 12 Lymphedema Pumps. Use Lymphedema pumps on leg(s) 2-3 times a day for 45-60 minutes. If wearing any wraps or hose, do not remove them. Continue exercising as instructed. - Use x1 per day Avoid standing for long periods of time. Exercise regularly Off-Loading: Wound #2 Left,Posterior Lower Leg: Other: - Elevate legs at heart level or above heart level while sitting. Additional Orders / Instructions: Other: - Recommend Nail Care WOUND #2: - Lower Leg Wound Laterality: Left, Posterior Cleanser: Soap and Water 1 x Per Week/30 Days Discharge Instructions: May shower and wash wound with dial antibacterial soap and water prior to dressing change. Peri-Wound Care: Ketoconazole Cream 2% 1 x Per Week/30 Days Discharge Instructions: Apply Ketoconazole as directed Peri-Wound Care: Triamcinolone 15 (g) 1 x Per Week/30 Days Discharge Instructions: Use triamcinolone 15 (g) as directed Peri-Wound Care: Zinc Oxide Ointment 30g tube 1 x Per Week/30 Days Discharge Instructions: Apply Zinc Oxide to periwound with each dressing change Peri-Wound Care: Sween Lotion (Moisturizing lotion) 1 x Per Week/30 Days Discharge Instructions: Apply moisturizing lotion as directed Topical: Gentamicin 1 x Per Week/30 Days Discharge Instructions: As directed by physician Topical: Mupirocin Ointment 1 x Per Week/30 Days Discharge Instructions: Apply Mupirocin (Bactroban) as instructed Topical: Skintegrity Hydrogel 4 (oz) 1 x Per Week/30 Days Discharge Instructions: Apply hydrogel as directed Prim Dressing: Promogran Prisma Matrix, 4.34 (sq in) (silver collagen) 1 x Per Week/30  Days ary Discharge Instructions: Moisten collagen with saline or hydrogel Secondary Dressing: Optifoam Non-Adhesive Dressing, 4x4 in 1 x Per Week/30 Days Discharge Instructions: Apply over primary dressing as directed. Secondary Dressing: Zetuvit Plus 4x8 in 1 x Per Week/30 Days Discharge Instructions: Apply over primary dressing as directed. Com pression Wrap: CoFlex Calamine Unna Boot 4 x 6 (in/yd) 1 x Per Week/30 Days Discharge Instructions: Apply Coflex Calamine [employer] as directed. 08/14/2023: No significant change to the wound dimensions. The surface is rather dry and fibrotic again. I used a curette to debride slough and eschar from the wound surface. We will continue topical gentamicin and mupirocin. When to change the contact layer to Prisma silver collagen. Continue to cover the site with Optifoam to try to obtain better moisture. Continue calamine-based [employer]. Follow-up in 1 week. Electronic Signature(s) Signed: 08/14/2023 2:52:53 PM By: Amber Guess MD FACS Entered By: [name] on 08/14/2023 14:52:52 -------------------------------------------------------------------------------- HxROS Details Patient Name: Date of Service: [name], [name] 08/14/2023 1:45 PM Medical Record Number: [id number] Patient Account Number: [id number] Date of Birth/Sex: Treating RN: [birth year] (77 y.o. F) Primary Care Provider: Eustaquio [name] Other Clinician: Referring Provider: Treating Provider/Extender: [name] in Treatment: 72 Information Obtained From Patient Eyes Medical History: Positive for: Cataracts - L eye Ear/Nose/Mouth/Throat Medical History: Negative for: Chronic sinus problems/congestion; Middle ear problems Hematologic/Lymphatic Medical History: Negative for: Anemia; Human Immunodeficiency Virus; Lymphedema [name], [name] ([id number]) [id number] Page 11 of 12  x 0.2cm depth; 1.257cm^3 volume. Character of Wound/Ulcer Post Debridement is stable. Severity of Tissue Post Debridement is: Fat layer exposed. Post procedure Diagnosis Wound #2: Same as Pre-Procedure Plan Follow-up Appointments: Return Appointment in 1 week. - Dr. Lady Gary Room 3 Anesthetic: Wound #2 Left,Posterior Lower Leg: (In clinic) Topical Lidocaine 4% applied to wound  bed Bathing/ Shower/ Hygiene: May shower with protection but do not get wound dressing(s) wet. Protect dressing(s) with water repellant cover (for example, large plastic bag) or a cast cover and may then take shower. Edema Control - Lymphedema / SCD / OtherRICHANDA, [name] ([id number]) [id number] Page 10 of 12 Lymphedema Pumps. Use Lymphedema pumps on leg(s) 2-3 times a day for 45-60 minutes. If wearing any wraps or hose, do not remove them. Continue exercising as instructed. - Use x1 per day Avoid standing for long periods of time. Exercise regularly Off-Loading: Wound #2 Left,Posterior Lower Leg: Other: - Elevate legs at heart level or above heart level while sitting. Additional Orders / Instructions: Other: - Recommend Nail Care WOUND #2: - Lower Leg Wound Laterality: Left, Posterior Cleanser: Soap and Water 1 x Per Week/30 Days Discharge Instructions: May shower and wash wound with dial antibacterial soap and water prior to dressing change. Peri-Wound Care: Ketoconazole Cream 2% 1 x Per Week/30 Days Discharge Instructions: Apply Ketoconazole as directed Peri-Wound Care: Triamcinolone 15 (g) 1 x Per Week/30 Days Discharge Instructions: Use triamcinolone 15 (g) as directed Peri-Wound Care: Zinc Oxide Ointment 30g tube 1 x Per Week/30 Days Discharge Instructions: Apply Zinc Oxide to periwound with each dressing change Peri-Wound Care: Sween Lotion (Moisturizing lotion) 1 x Per Week/30 Days Discharge Instructions: Apply moisturizing lotion as directed Topical: Gentamicin 1 x Per Week/30 Days Discharge Instructions: As directed by physician Topical: Mupirocin Ointment 1 x Per Week/30 Days Discharge Instructions: Apply Mupirocin (Bactroban) as instructed Topical: Skintegrity Hydrogel 4 (oz) 1 x Per Week/30 Days Discharge Instructions: Apply hydrogel as directed Prim Dressing: Promogran Prisma Matrix, 4.34 (sq in) (silver collagen) 1 x Per Week/30  Days ary Discharge Instructions: Moisten collagen with saline or hydrogel Secondary Dressing: Optifoam Non-Adhesive Dressing, 4x4 in 1 x Per Week/30 Days Discharge Instructions: Apply over primary dressing as directed. Secondary Dressing: Zetuvit Plus 4x8 in 1 x Per Week/30 Days Discharge Instructions: Apply over primary dressing as directed. Com pression Wrap: CoFlex Calamine Unna Boot 4 x 6 (in/yd) 1 x Per Week/30 Days Discharge Instructions: Apply Coflex Calamine [employer] as directed. 08/14/2023: No significant change to the wound dimensions. The surface is rather dry and fibrotic again. I used a curette to debride slough and eschar from the wound surface. We will continue topical gentamicin and mupirocin. When to change the contact layer to Prisma silver collagen. Continue to cover the site with Optifoam to try to obtain better moisture. Continue calamine-based [employer]. Follow-up in 1 week. Electronic Signature(s) Signed: 08/14/2023 2:52:53 PM By: Amber Guess MD FACS Entered By: [name] on 08/14/2023 14:52:52 -------------------------------------------------------------------------------- HxROS Details Patient Name: Date of Service: [name], [name] 08/14/2023 1:45 PM Medical Record Number: [id number] Patient Account Number: [id number] Date of Birth/Sex: Treating RN: [birth year] (77 y.o. F) Primary Care Provider: Eustaquio [name] Other Clinician: Referring Provider: Treating Provider/Extender: [name] in Treatment: 72 Information Obtained From Patient Eyes Medical History: Positive for: Cataracts - L eye Ear/Nose/Mouth/Throat Medical History: Negative for: Chronic sinus problems/congestion; Middle ear problems Hematologic/Lymphatic Medical History: Negative for: Anemia; Human Immunodeficiency Virus; Lymphedema [name], [name] ([id number]) [id number] Page 11 of 12  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 Gastrointestinal Medical History: Past Medical History Notes: liver cyst Endocrine Medical History: Negative for: Type I Diabetes; Type II Diabetes Musculoskeletal Medical History: Past Medical History Notes: arthritis Neurologic Medical History: Positive for: Neuropathy - Feet and finger Negative for: Seizure Disorder Past Medical History Notes: restless leg syndrome Oncologic Medical History: Positive for: Received Chemotherapy - 2012; Received Radiation - 2012 HBO Extended History Items Eyes: Cataracts Immunizations Pneumococcal Vaccine: Received Pneumococcal Vaccination: Yes Received Pneumococcal Vaccination On or After 60th Birthday: Yes Implantable Devices No devices added Hospitalization / Surgery History Type of Hospitalization/Surgery Lower extremity venography 2020 Intravascular ultrasound peripheral vascular intervention melanoma 2011 Family and Social History Cancer: Yes - Mother,Paternal Grandparents; Diabetes: Yes - Siblings; Heart Disease: Yes - Father; Hypertension: Yes - Father; Former smoker - quit in [employer]) Signed: 08/15/2023 7:55:21 AM By: Amber Guess MD FACS Entered By: [name] on 08/14/2023 14:51:03 [name] ([id number]) [id number] Page 12 of 12 -------------------------------------------------------------------------------- SuperBill Details Patient Name: Date of Service: [name], [name] 08/14/2023 Medical Record Number: [id number] Patient Account Number: [id number] Date of Birth/Sex: Treating RN: [birth year] (77 y.o. F) Primary Care Provider: Eustaquio [name] Other Clinician: Referring Provider: Treating Provider/Extender: [name] in Treatment: 54 Diagnosis Coding ICD-10 Codes Code Description [id number] Non-pressure chronic ulcer of other part of left lower leg with fat layer exposed I83.893 Varicose veins of bilateral lower extremities with other complications I87.2 Venous insufficiency (chronic) (peripheral) G62.9 Polyneuropathy, unspecified R60.0 Localized edema Facility Procedures : CPT4 Code: [id number] Description: 97597 - DEBRIDE WOUND 1ST 20 SQ CM OR < ICD-10 Diagnosis Description L97.822 Non-pressure chronic ulcer of other part of left lower leg with fat layer expose Modifier: d Quantity: 1 Physician Procedures : CPT4 Code Description Modifier [id number] 99214 - WC PHYS LEVEL 4 - EST PT 25 ICD-10 Diagnosis Description L97.822 Non-pressure chronic ulcer of other part of left lower leg with fat layer exposed I83.893 Varicose veins of bilateral lower extremities with  other complications I87.2 Venous insufficiency (chronic) (peripheral) R60.0 Localized edema Quantity: 1 : [id number] 97597 - WC PHYS DEBR WO ANESTH 20 SQ CM ICD-10 Diagnosis Description L97.822 Non-pressure chronic ulcer of other part of left lower leg with fat layer exposed Quantity: 1 Electronic Signature(s) Signed: 08/14/2023 2:53:10 PM By: Amber Guess MD FACS Entered By: [name] on 08/14/2023 14:53:10  [name], [name] ([id number]) [id number] Page 1 of 12 Visit Report for 08/14/2023 Chief Complaint Document Details Patient Name: Date of Service: KAYLANA, FENSTERMACHER 08/14/2023 1:45 PM Medical Record Number: [id number] Patient Account Number: [id number] Date of Birth/Sex: Treating RN: 11-29-1945 (77 y.o. F) Primary Care Provider: Eustaquio [name] Other Clinician: Referring Provider: Treating Provider/Extender: [name] in Treatment: 45 Information Obtained from: Patient Chief Complaint Patient presents for treatment of an open ulcer due to venous insufficiency Electronic Signature(s) Signed: 08/14/2023 2:50:26 PM By: Amber Guess MD FACS Entered By: [name] on 08/14/2023 14:50:26 -------------------------------------------------------------------------------- Debridement Details Patient Name: Date of Service: TARONDA, COMACHO 08/14/2023 1:45 PM Medical Record Number: [id number] Patient Account Number: [id number] Date of Birth/Sex: Treating RN: 1945-11-15 (77 y.o. [name] Primary Care Provider: Eustaquio [name] Other Clinician: Referring Provider: Treating Provider/Extender: [name] in Treatment: 54 Debridement Performed for Assessment: Wound #2 Left,Posterior Lower Leg Performed By: Physician Amber Guess, MD The following information was scribed by: Brenton Grills The information was scribed for: [name] Debridement Type: Debridement Severity of Tissue Pre Debridement: Fat layer exposed Level of Consciousness (Pre-procedure): Awake and Alert Pre-procedure Verification/Time Out Yes - 14:36 Taken: Start Time: 14:37 Pain Control: Lidocaine 4% Topical Solution Percent of Wound Bed Debrided: 100% T Area Debrided (cm): otal 6.28 Tissue and other material debrided: Non-Viable, Eschar, Slough, Slough Level: Non-Viable Tissue Debridement Description:  Selective/Open Wound Instrument: Curette Bleeding: Minimum Hemostasis Achieved: Pressure Response to Treatment: Procedure was tolerated well Level of Consciousness (Post- Awake and Alert procedure): Post Debridement Measurements of Total Wound Length: (cm) 3.2 Width: (cm) 2.5 Depth: (cm) 0.2 Volume: (cm) 1.257 Character of Wound/Ulcer Post Debridement: Stable Severity of Tissue Post Debridement: Fat layer exposed [name], [name] ([id number]) [id number] Page 2 of 12 Post Procedure Diagnosis Same as Pre-procedure Electronic Signature(s) Signed: 08/15/2023 7:55:21 AM By: Amber Guess MD FACS Signed: 08/15/2023 4:20:59 PM By: Brenton Grills Entered By: Brenton Grills on 08/14/2023 14:37:51 -------------------------------------------------------------------------------- HPI Details Patient Name: Date of Service: TAKELA, VARDEN 08/14/2023 1:45 PM Medical Record Number: [id number] Patient Account Number: [id number] Date of Birth/Sex: Treating RN: [birth year] (77 y.o. F) Primary Care Provider: Eustaquio [name] Other Clinician: Referring Provider: Treating Provider/Extender: [name] in Treatment: 55 History of Present Illness HPI Description: ADMISSION This is a 77 year old woman with a history of chronic venous insufficiency status post saphenous vein ablations in 2010 and 2016. 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 She presents today with an open venous ulcer on her left lower leg. It has been present for a little over 2 weeks. She saw her primary care provider who apparently swabbed the wound and grew out Pseudomonas. She just completed a course of ciprofloxacin for this. ABI was 0.91. She reports that she has had previous issues with ulcers in this same location, stemming back to a punch biopsy taken by a dermatologist many years ago. She has had several skin substitutes applied to the area  that have ultimately resulted in healing on prior occasions. She has 2 small ulcers on her left medial lower leg. There is slough accumulation in both of them. The more anterior of the 2 is quite small and has some soft slough on the surface, underneath which there is good granulation tissue. The more medial wound also has slough accumulation, but the underlying surface is fairly fibrotic and gritty. This is consistent with her provided history of multiple ulcers in the same location. 08/03/2022:  [name], [name] ([id number]) [id number] Page 1 of 12 Visit Report for 08/14/2023 Chief Complaint Document Details Patient Name: Date of Service: KAYLANA, FENSTERMACHER 08/14/2023 1:45 PM Medical Record Number: [id number] Patient Account Number: [id number] Date of Birth/Sex: Treating RN: 11-29-1945 (77 y.o. F) Primary Care Provider: Eustaquio [name] Other Clinician: Referring Provider: Treating Provider/Extender: [name] in Treatment: 45 Information Obtained from: Patient Chief Complaint Patient presents for treatment of an open ulcer due to venous insufficiency Electronic Signature(s) Signed: 08/14/2023 2:50:26 PM By: Amber Guess MD FACS Entered By: [name] on 08/14/2023 14:50:26 -------------------------------------------------------------------------------- Debridement Details Patient Name: Date of Service: TARONDA, COMACHO 08/14/2023 1:45 PM Medical Record Number: [id number] Patient Account Number: [id number] Date of Birth/Sex: Treating RN: 1945-11-15 (77 y.o. [name] Primary Care Provider: Eustaquio [name] Other Clinician: Referring Provider: Treating Provider/Extender: [name] in Treatment: 54 Debridement Performed for Assessment: Wound #2 Left,Posterior Lower Leg Performed By: Physician Amber Guess, MD The following information was scribed by: Brenton Grills The information was scribed for: [name] Debridement Type: Debridement Severity of Tissue Pre Debridement: Fat layer exposed Level of Consciousness (Pre-procedure): Awake and Alert Pre-procedure Verification/Time Out Yes - 14:36 Taken: Start Time: 14:37 Pain Control: Lidocaine 4% Topical Solution Percent of Wound Bed Debrided: 100% T Area Debrided (cm): otal 6.28 Tissue and other material debrided: Non-Viable, Eschar, Slough, Slough Level: Non-Viable Tissue Debridement Description:  Selective/Open Wound Instrument: Curette Bleeding: Minimum Hemostasis Achieved: Pressure Response to Treatment: Procedure was tolerated well Level of Consciousness (Post- Awake and Alert procedure): Post Debridement Measurements of Total Wound Length: (cm) 3.2 Width: (cm) 2.5 Depth: (cm) 0.2 Volume: (cm) 1.257 Character of Wound/Ulcer Post Debridement: Stable Severity of Tissue Post Debridement: Fat layer exposed [name], [name] ([id number]) [id number] Page 2 of 12 Post Procedure Diagnosis Same as Pre-procedure Electronic Signature(s) Signed: 08/15/2023 7:55:21 AM By: Amber Guess MD FACS Signed: 08/15/2023 4:20:59 PM By: Brenton Grills Entered By: Brenton Grills on 08/14/2023 14:37:51 -------------------------------------------------------------------------------- HPI Details Patient Name: Date of Service: TAKELA, VARDEN 08/14/2023 1:45 PM Medical Record Number: [id number] Patient Account Number: [id number] Date of Birth/Sex: Treating RN: [birth year] (77 y.o. F) Primary Care Provider: Eustaquio [name] Other Clinician: Referring Provider: Treating Provider/Extender: [name] in Treatment: 55 History of Present Illness HPI Description: ADMISSION This is a 77 year old woman with a history of chronic venous insufficiency status post saphenous vein ablations in 2010 and 2016. 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 She presents today with an open venous ulcer on her left lower leg. It has been present for a little over 2 weeks. She saw her primary care provider who apparently swabbed the wound and grew out Pseudomonas. She just completed a course of ciprofloxacin for this. ABI was 0.91. She reports that she has had previous issues with ulcers in this same location, stemming back to a punch biopsy taken by a dermatologist many years ago. She has had several skin substitutes applied to the area  that have ultimately resulted in healing on prior occasions. She has 2 small ulcers on her left medial lower leg. There is slough accumulation in both of them. The more anterior of the 2 is quite small and has some soft slough on the surface, underneath which there is good granulation tissue. The more medial wound also has slough accumulation, but the underlying surface is fairly fibrotic and gritty. This is consistent with her provided history of multiple ulcers in the same location. 08/03/2022:  Apply hydrogel as directed Prim Dressing: Promogran Prisma Matrix, 4.34 (sq in) (silver  collagen) 1 x Per Week/30 Days ary Discharge Instructions: Moisten collagen with saline or hydrogel Secondary Dressing: Optifoam Non-Adhesive Dressing, 4x4 in 1 x Per Week/30 Days Discharge Instructions: Apply over primary dressing as directed. Secondary Dressing: Zetuvit Plus 4x8 in 1 x Per Week/30 Days Discharge Instructions: Apply over primary dressing as directed. Compression Wrap: CoFlex [employer]amine Unna Boot 4 x 6 (in/yd) 1 x Per Week/30 Days Discharge Instructions: Apply Coflex Calamine [employer] as directed. Electronic Signature(s) Signed: 08/15/2023 7:55:21 AM By: Amber Guess MD FACS Entered By: [name] on 08/14/2023 14:51:56 -------------------------------------------------------------------------------- Problem List Details Patient Name: Date of Service: ELONA, YINGER 08/14/2023 1:45 PM Medical Record Number: [id number] Patient Account Number: [id number] Date of Birth/Sex: Treating RN: Nov 01, 1946 (77 y.o. [name] Primary Care Provider: Eustaquio [name] Other Clinician: Referring Provider: Treating Provider/Extender: [name] in Treatment: [address] [place] ([id number]) [id number] Page 6 of 12 Active Problems ICD-10 Encounter Code Description Active Date MDM Diagnosis L97.822 Non-pressure chronic ulcer of other part of left lower leg with fat layer exposed 07/27/2022 No Yes I83.893 Varicose veins of bilateral lower extremities with other complications 07/27/2022 No Yes I87.2 Venous insufficiency (chronic) (peripheral) 07/27/2022 No Yes G62.9 Polyneuropathy, unspecified 07/27/2022 No Yes R60.0 Localized edema 07/27/2022 No Yes Inactive Problems ICD-10 Code Description Active Date Inactive Date L03.115 Cellulitis of right lower limb 10/22/2022 10/22/2022 L97.818 Non-pressure chronic ulcer of other part of right lower leg with other specified severity 03/01/2023 10/22/2022 Resolved  Problems Electronic Signature(s) Signed: 08/14/2023 2:49:29 PM By: Amber Guess MD FACS Entered By: [name] on 08/14/2023 14:49:28 -------------------------------------------------------------------------------- Progress Note Details Patient Name: Date of Service: ZEINA, AKKERMAN 08/14/2023 1:45 PM Medical Record Number: [id number] Patient Account Number: [id number] Date of Birth/Sex: Treating RN: [birth year] (77 y.o. F) Primary Care Provider: Eustaquio [name] Other Clinician: Referring Provider: Treating Provider/Extender: [name] in Treatment: 14 Subjective Chief Complaint Information obtained from Patient Patient presents for treatment of an open ulcer due to venous insufficiency History of Present Illness (HPI) ADMISSION This is a 77 year old woman with a history of chronic venous insufficiency status post saphenous vein ablations in 2010 and 2016. 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 She presents today with an open venous ulcer on her left lower leg. It has been present for a little over 2 weeks. She saw her primary care provider who apparently swabbed the wound and grew out Pseudomonas. She just completed a course of ciprofloxacin for this. ABI was 0.91. She reports that she has had previous issues with ulcers in this same location, stemming back to a punch biopsy taken by a dermatologist many years ago. She has had several skin substitutes applied to the area that have ultimately resulted in healing on prior occasions. She has 2 small ulcers on her left medial lower leg. There is slough accumulation in both of them. The more anterior of the 2 is quite small and has some soft [name], [name] ([id number]) [id number] Page 7 of 12 slough on the surface, underneath which there is good granulation tissue. The more medial wound also has slough accumulation, but the underlying surface  is fairly fibrotic and gritty. This is consistent with her provided history of multiple ulcers in the same location. 08/03/2022: The anterior wound is smaller today with just a little bit of slough accumulation. The more medial wound continues to be very sensitive  is a little bit of slough overlying granulation tissue. [name], [name] ([id number]) [id number] Page 4 of 12 07/24/2023: The band of epithelium between the 2 open areas has gotten wider. The more proximal area has more epithelialization around the perimeter. Both have slough on the surface and remain fairly fibrotic. Moisture balance remains good. 08/07/2023: Both open areas are little bit smaller today. The band of epithelium between the 2 continues  to enlarge. There is slough and eschar on both surfaces. 08/14/2023: No significant change to the wound dimensions. The surface is rather dry and fibrotic again. Electronic Signature(s) Signed: 08/14/2023 2:50:56 PM By: Amber Guess MD FACS Entered By: [name] on 08/14/2023 14:50:56 -------------------------------------------------------------------------------- Physical Exam Details Patient Name: Date of Service: [name], [name] 08/14/2023 1:45 PM Medical Record Number: [id number] Patient Account Number: [id number] Date of Birth/Sex: Treating RN: 11/28/1945 (77 y.o. F) Primary Care Provider: Eustaquio [name] Other Clinician: Referring Provider: Treating Provider/Extender: [name] in Treatment: 34 Constitutional Hypertensive, asymptomatic. . . . no acute distress. Respiratory Normal work of breathing on room air. Notes 08/14/2023: No significant change to the wound dimensions. The surface is rather dry and fibrotic again. Electronic Signature(s) Signed: 08/14/2023 2:51:27 PM By: Amber Guess MD FACS Entered By: [name] on 08/14/2023 14:51:26 -------------------------------------------------------------------------------- Physician Orders Details Patient Name: Date of Service: [name], [name] 08/14/2023 1:45 PM Medical Record Number: [id number] Patient Account Number: [id number] Date of Birth/Sex: Treating RN: [birth year] (77 y.o. [name] Primary Care Provider: Eustaquio [name] Other Clinician: Referring Provider: Treating Provider/Extender: [name] in Treatment: 46 The following information was scribed by: Brenton Grills The information was scribed for: [name] Verbal / Phone Orders: No Diagnosis Coding Follow-up Appointments ppointment in 1 week. - Dr. Lady Gary Room 3 Return A Anesthetic Wound #2 Left,Posterior Lower Leg (In clinic) Topical Lidocaine 4% applied to wound  bed Bathing/ Shower/ Hygiene May shower with protection but do not get wound dressing(s) wet. Protect dressing(s) with water repellant cover (for example, large plastic bag) or a cast cover and may then take shower. [name], [name] ([id number]) [id number] Page 5 of 12 Edema Control - Lymphedema / SCD / Other Left Lower Extremity Lymphedema Pumps. Use Lymphedema pumps on leg(s) 2-3 times a day for 45-60 minutes. If wearing any wraps or hose, do not remove them. Continue exercising as instructed. - Use x1 per day Avoid standing for long periods of time. Exercise regularly Off-Loading Wound #2 Left,Posterior Lower Leg Other: - Elevate legs at heart level or above heart level while sitting. Additional Orders / Instructions Other: - Recommend Nail Care Wound Treatment Wound #2 - Lower Leg Wound Laterality: Left, Posterior Cleanser: Soap and Water 1 x Per Week/30 Days Discharge Instructions: May shower and wash wound with dial antibacterial soap and water prior to dressing change. Peri-Wound Care: Ketoconazole Cream 2% 1 x Per Week/30 Days Discharge Instructions: Apply Ketoconazole as directed Peri-Wound Care: Triamcinolone 15 (g) 1 x Per Week/30 Days Discharge Instructions: Use triamcinolone 15 (g) as directed Peri-Wound Care: Zinc Oxide Ointment 30g tube 1 x Per Week/30 Days Discharge Instructions: Apply Zinc Oxide to periwound with each dressing change Peri-Wound Care: Sween Lotion (Moisturizing lotion) 1 x Per Week/30 Days Discharge Instructions: Apply moisturizing lotion as directed Topical: Gentamicin 1 x Per Week/30 Days Discharge Instructions: As directed by physician Topical: Mupirocin Ointment 1 x Per Week/30 Days Discharge Instructions: Apply Mupirocin (Bactroban) as instructed Topical: Skintegrity Hydrogel 4 (oz) 1 x Per Week/30 Days Discharge Instructions:  x 0.2cm depth; 1.257cm^3 volume. Character of Wound/Ulcer Post Debridement is stable. Severity of Tissue Post Debridement is: Fat layer exposed. Post procedure Diagnosis Wound #2: Same as Pre-Procedure Plan Follow-up Appointments: Return Appointment in 1 week. - Dr. Lady Gary Room 3 Anesthetic: Wound #2 Left,Posterior Lower Leg: (In clinic) Topical Lidocaine 4% applied to wound  bed Bathing/ Shower/ Hygiene: May shower with protection but do not get wound dressing(s) wet. Protect dressing(s) with water repellant cover (for example, large plastic bag) or a cast cover and may then take shower. Edema Control - Lymphedema / SCD / OtherRICHANDA, [name] ([id number]) [id number] Page 10 of 12 Lymphedema Pumps. Use Lymphedema pumps on leg(s) 2-3 times a day for 45-60 minutes. If wearing any wraps or hose, do not remove them. Continue exercising as instructed. - Use x1 per day Avoid standing for long periods of time. Exercise regularly Off-Loading: Wound #2 Left,Posterior Lower Leg: Other: - Elevate legs at heart level or above heart level while sitting. Additional Orders / Instructions: Other: - Recommend Nail Care WOUND #2: - Lower Leg Wound Laterality: Left, Posterior Cleanser: Soap and Water 1 x Per Week/30 Days Discharge Instructions: May shower and wash wound with dial antibacterial soap and water prior to dressing change. Peri-Wound Care: Ketoconazole Cream 2% 1 x Per Week/30 Days Discharge Instructions: Apply Ketoconazole as directed Peri-Wound Care: Triamcinolone 15 (g) 1 x Per Week/30 Days Discharge Instructions: Use triamcinolone 15 (g) as directed Peri-Wound Care: Zinc Oxide Ointment 30g tube 1 x Per Week/30 Days Discharge Instructions: Apply Zinc Oxide to periwound with each dressing change Peri-Wound Care: Sween Lotion (Moisturizing lotion) 1 x Per Week/30 Days Discharge Instructions: Apply moisturizing lotion as directed Topical: Gentamicin 1 x Per Week/30 Days Discharge Instructions: As directed by physician Topical: Mupirocin Ointment 1 x Per Week/30 Days Discharge Instructions: Apply Mupirocin (Bactroban) as instructed Topical: Skintegrity Hydrogel 4 (oz) 1 x Per Week/30 Days Discharge Instructions: Apply hydrogel as directed Prim Dressing: Promogran Prisma Matrix, 4.34 (sq in) (silver collagen) 1 x Per Week/30  Days ary Discharge Instructions: Moisten collagen with saline or hydrogel Secondary Dressing: Optifoam Non-Adhesive Dressing, 4x4 in 1 x Per Week/30 Days Discharge Instructions: Apply over primary dressing as directed. Secondary Dressing: Zetuvit Plus 4x8 in 1 x Per Week/30 Days Discharge Instructions: Apply over primary dressing as directed. Com pression Wrap: CoFlex Calamine Unna Boot 4 x 6 (in/yd) 1 x Per Week/30 Days Discharge Instructions: Apply Coflex Calamine [employer] as directed. 08/14/2023: No significant change to the wound dimensions. The surface is rather dry and fibrotic again. I used a curette to debride slough and eschar from the wound surface. We will continue topical gentamicin and mupirocin. When to change the contact layer to Prisma silver collagen. Continue to cover the site with Optifoam to try to obtain better moisture. Continue calamine-based [employer]. Follow-up in 1 week. Electronic Signature(s) Signed: 08/14/2023 2:52:53 PM By: Amber Guess MD FACS Entered By: [name] on 08/14/2023 14:52:52 -------------------------------------------------------------------------------- HxROS Details Patient Name: Date of Service: [name], [name] 08/14/2023 1:45 PM Medical Record Number: [id number] Patient Account Number: [id number] Date of Birth/Sex: Treating RN: [birth year] (77 y.o. F) Primary Care Provider: Eustaquio [name] Other Clinician: Referring Provider: Treating Provider/Extender: [name] in Treatment: 72 Information Obtained From Patient Eyes Medical History: Positive for: Cataracts - L eye Ear/Nose/Mouth/Throat Medical History: Negative for: Chronic sinus problems/congestion; Middle ear problems Hematologic/Lymphatic Medical History: Negative for: Anemia; Human Immunodeficiency Virus; Lymphedema [name], [name] ([id number]) [id number] Page 11 of 12  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 Gastrointestinal Medical History: Past Medical History Notes: liver cyst Endocrine Medical History: Negative for: Type I Diabetes; Type II Diabetes Musculoskeletal Medical History: Past Medical History Notes: arthritis Neurologic Medical History: Positive for: Neuropathy - Feet and finger Negative for: Seizure Disorder Past Medical History Notes: restless leg syndrome Oncologic Medical History: Positive for: Received Chemotherapy - 2012; Received Radiation - 2012 HBO Extended History Items Eyes: Cataracts Immunizations Pneumococcal Vaccine: Received Pneumococcal Vaccination: Yes Received Pneumococcal Vaccination On or After 60th Birthday: Yes Implantable Devices No devices added Hospitalization / Surgery History Type of Hospitalization/Surgery Lower extremity venography 2020 Intravascular ultrasound peripheral vascular intervention melanoma 2011 Family and Social History Cancer: Yes - Mother,Paternal Grandparents; Diabetes: Yes - Siblings; Heart Disease: Yes - Father; Hypertension: Yes - Father; Former smoker - quit in [employer]) Signed: 08/15/2023 7:55:21 AM By: Amber Guess MD FACS Entered By: [name] on 08/14/2023 14:51:03 [name] ([id number]) [id number] Page 12 of 12 -------------------------------------------------------------------------------- SuperBill Details Patient Name: Date of Service: [name], [name] 08/14/2023 Medical Record Number: [id number] Patient Account Number: [id number] Date of Birth/Sex: Treating RN: [birth year] (77 y.o. F) Primary Care Provider: Eustaquio [name] Other Clinician: Referring Provider: Treating Provider/Extender: [name] in Treatment: 54 Diagnosis Coding ICD-10 Codes Code Description [phone] Non-pressure chronic ulcer of other part of left lower leg with fat layer exposed I83.893 Varicose veins of bilateral lower extremities with other complications I87.2 Venous insufficiency (chronic) (peripheral) G62.9 Polyneuropathy, unspecified R60.0 Localized edema Facility Procedures : CPT4 Code: [id number] Description: 97597 - DEBRIDE WOUND 1ST 20 SQ CM OR < ICD-10 Diagnosis Description L97.822 Non-pressure chronic ulcer of other part of left lower leg with fat layer expose Modifier: d Quantity: 1 Physician Procedures : CPT4 Code Description Modifier [id number] 99214 - WC PHYS LEVEL 4 - EST PT 25 ICD-10 Diagnosis Description L97.822 Non-pressure chronic ulcer of other part of left lower leg with fat layer exposed I83.893 Varicose veins of bilateral lower extremities with  other complications I87.2 Venous insufficiency (chronic) (peripheral) R60.0 Localized edema Quantity: 1 : [id number] 97597 - WC PHYS DEBR WO ANESTH 20 SQ CM ICD-10 Diagnosis Description L97.822 Non-pressure chronic ulcer of other part of left lower leg with fat layer exposed Quantity: 1 Electronic Signature(s) Signed: 08/14/2023 2:53:10 PM By: Amber Guess MD FACS Entered By: [name] on 08/14/2023 14:53:10  x 0.2cm depth; 1.257cm^3 volume. Character of Wound/Ulcer Post Debridement is stable. Severity of Tissue Post Debridement is: Fat layer exposed. Post procedure Diagnosis Wound #2: Same as Pre-Procedure Plan Follow-up Appointments: Return Appointment in 1 week. - Dr. Lady Gary Room 3 Anesthetic: Wound #2 Left,Posterior Lower Leg: (In clinic) Topical Lidocaine 4% applied to wound  bed Bathing/ Shower/ Hygiene: May shower with protection but do not get wound dressing(s) wet. Protect dressing(s) with water repellant cover (for example, large plastic bag) or a cast cover and may then take shower. Edema Control - Lymphedema / SCD / OtherRICHANDA, [name] ([id number]) [id number] Page 10 of 12 Lymphedema Pumps. Use Lymphedema pumps on leg(s) 2-3 times a day for 45-60 minutes. If wearing any wraps or hose, do not remove them. Continue exercising as instructed. - Use x1 per day Avoid standing for long periods of time. Exercise regularly Off-Loading: Wound #2 Left,Posterior Lower Leg: Other: - Elevate legs at heart level or above heart level while sitting. Additional Orders / Instructions: Other: - Recommend Nail Care WOUND #2: - Lower Leg Wound Laterality: Left, Posterior Cleanser: Soap and Water 1 x Per Week/30 Days Discharge Instructions: May shower and wash wound with dial antibacterial soap and water prior to dressing change. Peri-Wound Care: Ketoconazole Cream 2% 1 x Per Week/30 Days Discharge Instructions: Apply Ketoconazole as directed Peri-Wound Care: Triamcinolone 15 (g) 1 x Per Week/30 Days Discharge Instructions: Use triamcinolone 15 (g) as directed Peri-Wound Care: Zinc Oxide Ointment 30g tube 1 x Per Week/30 Days Discharge Instructions: Apply Zinc Oxide to periwound with each dressing change Peri-Wound Care: Sween Lotion (Moisturizing lotion) 1 x Per Week/30 Days Discharge Instructions: Apply moisturizing lotion as directed Topical: Gentamicin 1 x Per Week/30 Days Discharge Instructions: As directed by physician Topical: Mupirocin Ointment 1 x Per Week/30 Days Discharge Instructions: Apply Mupirocin (Bactroban) as instructed Topical: Skintegrity Hydrogel 4 (oz) 1 x Per Week/30 Days Discharge Instructions: Apply hydrogel as directed Prim Dressing: Promogran Prisma Matrix, 4.34 (sq in) (silver collagen) 1 x Per Week/30  Days ary Discharge Instructions: Moisten collagen with saline or hydrogel Secondary Dressing: Optifoam Non-Adhesive Dressing, 4x4 in 1 x Per Week/30 Days Discharge Instructions: Apply over primary dressing as directed. Secondary Dressing: Zetuvit Plus 4x8 in 1 x Per Week/30 Days Discharge Instructions: Apply over primary dressing as directed. Com pression Wrap: CoFlex Calamine Unna Boot 4 x 6 (in/yd) 1 x Per Week/30 Days Discharge Instructions: Apply Coflex Calamine [employer] as directed. 08/14/2023: No significant change to the wound dimensions. The surface is rather dry and fibrotic again. I used a curette to debride slough and eschar from the wound surface. We will continue topical gentamicin and mupirocin. When to change the contact layer to Prisma silver collagen. Continue to cover the site with Optifoam to try to obtain better moisture. Continue calamine-based [employer]. Follow-up in 1 week. Electronic Signature(s) Signed: 08/14/2023 2:52:53 PM By: Amber Guess MD FACS Entered By: [name] on 08/14/2023 14:52:52 -------------------------------------------------------------------------------- HxROS Details Patient Name: Date of Service: [name], [name] 08/14/2023 1:45 PM Medical Record Number: [id number] Patient Account Number: [id number] Date of Birth/Sex: Treating RN: [birth year] (77 y.o. F) Primary Care Provider: Eustaquio [name] Other Clinician: Referring Provider: Treating Provider/Extender: [name] in Treatment: 72 Information Obtained From Patient Eyes Medical History: Positive for: Cataracts - L eye Ear/Nose/Mouth/Throat Medical History: Negative for: Chronic sinus problems/congestion; Middle ear problems Hematologic/Lymphatic Medical History: Negative for: Anemia; Human Immunodeficiency Virus; Lymphedema [name], [name] ([id number]) [id number] Page 11 of 12  is a little bit of slough overlying granulation tissue. [name], [name] ([id number]) [id number] Page 4 of 12 07/24/2023: The band of epithelium between the 2 open areas has gotten wider. The more proximal area has more epithelialization around the perimeter. Both have slough on the surface and remain fairly fibrotic. Moisture balance remains good. 08/07/2023: Both open areas are little bit smaller today. The band of epithelium between the 2 continues  to enlarge. There is slough and eschar on both surfaces. 08/14/2023: No significant change to the wound dimensions. The surface is rather dry and fibrotic again. Electronic Signature(s) Signed: 08/14/2023 2:50:56 PM By: Amber Guess MD FACS Entered By: [name] on 08/14/2023 14:50:56 -------------------------------------------------------------------------------- Physical Exam Details Patient Name: Date of Service: [name], [name] 08/14/2023 1:45 PM Medical Record Number: [id number] Patient Account Number: [id number] Date of Birth/Sex: Treating RN: 11/28/1945 (77 y.o. F) Primary Care Provider: Eustaquio [name] Other Clinician: Referring Provider: Treating Provider/Extender: [name] in Treatment: 34 Constitutional Hypertensive, asymptomatic. . . . no acute distress. Respiratory Normal work of breathing on room air. Notes 08/14/2023: No significant change to the wound dimensions. The surface is rather dry and fibrotic again. Electronic Signature(s) Signed: 08/14/2023 2:51:27 PM By: Amber Guess MD FACS Entered By: [name] on 08/14/2023 14:51:26 -------------------------------------------------------------------------------- Physician Orders Details Patient Name: Date of Service: [name], [name] 08/14/2023 1:45 PM Medical Record Number: [id number] Patient Account Number: [id number] Date of Birth/Sex: Treating RN: [birth year] (77 y.o. [name] Primary Care Provider: Eustaquio [name] Other Clinician: Referring Provider: Treating Provider/Extender: [name] in Treatment: 46 The following information was scribed by: Brenton Grills The information was scribed for: [name] Verbal / Phone Orders: No Diagnosis Coding Follow-up Appointments ppointment in 1 week. - Dr. Lady Gary Room 3 Return A Anesthetic Wound #2 Left,Posterior Lower Leg (In clinic) Topical Lidocaine 4% applied to wound  bed Bathing/ Shower/ Hygiene May shower with protection but do not get wound dressing(s) wet. Protect dressing(s) with water repellant cover (for example, large plastic bag) or a cast cover and may then take shower. [name], [name] ([id number]) [id number] Page 5 of 12 Edema Control - Lymphedema / SCD / Other Left Lower Extremity Lymphedema Pumps. Use Lymphedema pumps on leg(s) 2-3 times a day for 45-60 minutes. If wearing any wraps or hose, do not remove them. Continue exercising as instructed. - Use x1 per day Avoid standing for long periods of time. Exercise regularly Off-Loading Wound #2 Left,Posterior Lower Leg Other: - Elevate legs at heart level or above heart level while sitting. Additional Orders / Instructions Other: - Recommend Nail Care Wound Treatment Wound #2 - Lower Leg Wound Laterality: Left, Posterior Cleanser: Soap and Water 1 x Per Week/30 Days Discharge Instructions: May shower and wash wound with dial antibacterial soap and water prior to dressing change. Peri-Wound Care: Ketoconazole Cream 2% 1 x Per Week/30 Days Discharge Instructions: Apply Ketoconazole as directed Peri-Wound Care: Triamcinolone 15 (g) 1 x Per Week/30 Days Discharge Instructions: Use triamcinolone 15 (g) as directed Peri-Wound Care: Zinc Oxide Ointment 30g tube 1 x Per Week/30 Days Discharge Instructions: Apply Zinc Oxide to periwound with each dressing change Peri-Wound Care: Sween Lotion (Moisturizing lotion) 1 x Per Week/30 Days Discharge Instructions: Apply moisturizing lotion as directed Topical: Gentamicin 1 x Per Week/30 Days Discharge Instructions: As directed by physician Topical: Mupirocin Ointment 1 x Per Week/30 Days Discharge Instructions: Apply Mupirocin (Bactroban) as instructed Topical: Skintegrity Hydrogel 4 (oz) 1 x Per Week/30 Days Discharge Instructions:

## 2023-08-16 ENCOUNTER — Inpatient Hospital Stay: Payer: Medicare Other | Attending: Hematology & Oncology

## 2023-08-16 ENCOUNTER — Encounter: Payer: Self-pay | Admitting: Hematology & Oncology

## 2023-08-16 ENCOUNTER — Inpatient Hospital Stay: Payer: Medicare Other | Admitting: Hematology & Oncology

## 2023-08-16 ENCOUNTER — Other Ambulatory Visit: Payer: Self-pay

## 2023-08-16 VITALS — BP 152/66 | HR 75 | Temp 97.8°F | Resp 20 | Ht 61.0 in | Wt 209.0 lb

## 2023-08-16 DIAGNOSIS — Z85820 Personal history of malignant melanoma of skin: Secondary | ICD-10-CM | POA: Diagnosis not present

## 2023-08-16 DIAGNOSIS — C439 Malignant melanoma of skin, unspecified: Secondary | ICD-10-CM

## 2023-08-16 DIAGNOSIS — Z79899 Other long term (current) drug therapy: Secondary | ICD-10-CM | POA: Diagnosis not present

## 2023-08-16 DIAGNOSIS — C4361 Malignant melanoma of right upper limb, including shoulder: Secondary | ICD-10-CM | POA: Diagnosis not present

## 2023-08-16 LAB — CMP (CANCER CENTER ONLY)
ALT: 19 U/L (ref 0–44)
AST: 12 U/L — ABNORMAL LOW (ref 15–41)
Albumin: 4.1 g/dL (ref 3.5–5.0)
Alkaline Phosphatase: 48 U/L (ref 38–126)
Anion gap: 8 (ref 5–15)
BUN: 14 mg/dL (ref 8–23)
CO2: 33 mmol/L — ABNORMAL HIGH (ref 22–32)
Calcium: 9.7 mg/dL (ref 8.9–10.3)
Chloride: 104 mmol/L (ref 98–111)
Creatinine: 0.72 mg/dL (ref 0.44–1.00)
GFR, Estimated: >60 mL/min (ref >60)
Glucose, Bld: 122 mg/dL — ABNORMAL HIGH (ref 70–99)
Potassium: 4.1 mmol/L (ref 3.5–5.1)
Sodium: 145 mmol/L (ref 135–145)
Total Bilirubin: 0.6 mg/dL (ref 0.3–1.2)
Total Protein: 7.6 g/dL (ref 6.5–8.1)

## 2023-08-16 LAB — CBC WITH DIFFERENTIAL (CANCER CENTER ONLY)
Abs Immature Granulocytes: 0.03 10*3/uL (ref 0.00–0.07)
Basophils Absolute: 0 10*3/uL (ref 0.0–0.1)
Basophils Relative: 0 %
Eosinophils Absolute: 0.2 10*3/uL (ref 0.0–0.5)
Eosinophils Relative: 2 %
HCT: 40.2 % (ref 36.0–46.0)
Hemoglobin: 13.1 g/dL (ref 12.0–15.0)
Immature Granulocytes: 0 %
Lymphocytes Relative: 21 %
Lymphs Abs: 1.8 10*3/uL (ref 0.7–4.0)
MCH: 30.2 pg (ref 26.0–34.0)
MCHC: 32.6 g/dL (ref 30.0–36.0)
MCV: 92.6 fL (ref 80.0–100.0)
Monocytes Absolute: 0.4 10*3/uL (ref 0.1–1.0)
Monocytes Relative: 5 %
Neutro Abs: 5.9 10*3/uL (ref 1.7–7.7)
Neutrophils Relative %: 72 %
Platelet Count: 203 10*3/uL (ref 150–400)
RBC: 4.34 MIL/uL (ref 3.87–5.11)
RDW: 12.7 % (ref 11.5–15.5)
WBC Count: 8.2 10*3/uL (ref 4.0–10.5)
nRBC: 0 % (ref 0.0–0.2)

## 2023-08-16 LAB — LACTATE DEHYDROGENASE: LDH: 220 U/L — ABNORMAL HIGH (ref 98–192)

## 2023-08-16 NOTE — Progress Notes (Signed)
BP remains elevated 152/66, has not taken BP meds yet.  Encouraged to monitor and contact PCP if remains elevated, 140/90. Will have it taken at [facility].   Verbalized understanding.

## 2023-08-16 NOTE — Progress Notes (Signed)
Hematology and Oncology Follow Up Visit  [name] [id number] [birth year] 77 y.o. 08/16/2023   Principle Diagnosis:  Stage IIIC (T3b N3 M0) melanoma of the right shoulder   Current Therapy:        Observation   Interim History:  [name] is here today for follow-up.  We last saw her a year ago.  I must say that is very nice to see that she seems to be much more cognitively intact.  Again, it is sound like she may have to have had medication induced issues.  She definitely is much more alert now.  It was wonderful talking with her today.  Overall, I think she is managing okay.  She has a lot of health issues are not related to her having melanoma.  She has had no problems with nausea or vomiting.  She has she had COVID 2 weeks ago.  She was on Paxlovid.  She got COVID from I think her brother.  She has had no problems with bowels or bladder..  She does have issues with leg ulcers.  She is followed by the wound clinic.  She has wrappings on her legs.  She has had no obvious bleeding.  Overall, I would say that her performance status is probably ECOG 1.     Medications:  Allergies as of 08/16/2023       Reactions   Sulfa Antibiotics Itching, Swelling, Shortness Of Breath   Facial swelling   Voltaren [diclofenac Sodium] Shortness Of Breath   Latex Other (See Comments)   Blisters ONLY HAD REACTION TO TAPE  (??? ONLY ADHESIVE ALLERGY)   Tape Other (See Comments)   Caused blisters - must use paper tape - same reaction from NeoPrene   Doxycycline Other (See Comments)   Diaphoresis and dizziness   Gabapentin Other (See Comments)   confusion, sleep a lot, and swelling in her legs   Other    Neoprene   Wound Dressing Adhesive Rash        Medication List        Accurate as of August 16, 2023 12:15 PM. If you have any questions, ask your nurse or doctor.          acetaminophen 650 MG CR tablet Commonly known as: TYLENOL Take 2 tablets (1,300 mg total) by mouth 2  (two) times daily as needed for pain.   albuterol 108 (90 Base) MCG/ACT inhaler Commonly known as: VENTOLIN HFA INHALE 1-2 PUFFS BY MOUTH EVERY 6 HOURS AS NEEDED FOR WHEEZE OR SHORTNESS OF BREATH   aspirin EC 81 MG tablet Take 81 mg by mouth at bedtime. Melanoma prevention   Chloraseptic Gargle 1.4 % Liqd Generic drug: phenol Use as directed 1 spray in the mouth or throat as needed for throat irritation / pain.   furosemide 40 MG tablet Commonly known as: LASIX Take 1 tablet (40 mg total) by mouth daily. Take second dose if needed for leg swelling/weight gain   loratadine 10 MG tablet Commonly known as: CLARITIN Take 10 mg by mouth daily.   losartan 50 MG tablet Commonly known as: COZAAR Take 1 tablet (50 mg total) by mouth daily.   montelukast 10 MG tablet Commonly known as: SINGULAIR Take 1 tablet (10 mg total) by mouth daily.   multivitamin with minerals Tabs tablet Take 1 tablet by mouth daily.   potassium chloride SA 20 MEQ tablet Commonly known as: Klor-Con M20 Take 1 tablet (20 mEq total) by mouth 2 (two) times daily.  pregabalin 25 MG capsule Commonly known as: LYRICA Take 1 capsule (25 mg total) by mouth 2 (two) times daily.   PRESCRIPTION MEDICATION CPAP   saccharomyces boulardii 250 MG capsule Commonly known as: Florastor Take 1 capsule (250 mg total) by mouth 2 (two) times daily.   TEARS PLUS OP Place 1 drop into both eyes daily as needed (dry eyes/ redness/ burning).   triamcinolone 55 MCG/ACT Aero nasal inhaler Commonly known as: NASACORT Place 2 sprays into the nose daily. In each nostril   Vitamin D (Ergocalciferol) 1.25 MG (50000 UNIT) Caps capsule Commonly known as: DRISDOL TAKE 1 CAPSULE BY MOUTH ONCE A WEEK        Allergies:  Allergies  Allergen Reactions   Sulfa Antibiotics Itching, Swelling and Shortness Of Breath    Facial swelling   Voltaren [Diclofenac Sodium] Shortness Of Breath   Latex Other (See Comments)     Blisters ONLY HAD REACTION TO TAPE  (??? ONLY ADHESIVE ALLERGY)   Tape Other (See Comments)    Caused blisters - must use paper tape - same reaction from NeoPrene   Doxycycline Other (See Comments)    Diaphoresis and dizziness   Gabapentin Other (See Comments)    confusion, sleep a lot, and swelling in her legs   Other     Neoprene   Wound Dressing Adhesive Rash    Past Medical History, Surgical history, Social history, and Family History were reviewed and updated.  Review of Systems: Review of Systems  Constitutional: Negative.   HENT: Negative.    Eyes: Negative.   Respiratory: Negative.    Cardiovascular: Negative.   Gastrointestinal: Negative.   Genitourinary: Negative.   Musculoskeletal: Negative.   Skin:  Positive for rash.  Neurological: Negative.   Endo/Heme/Allergies: Negative.   Psychiatric/Behavioral: Negative.        Physical Exam:  height is 5\' 1"  (1.549 m) and weight is 209 lb (94.8 kg). Her oral temperature is 97.8 F (36.6 C). Her blood pressure is 152/66 (abnormal) and her pulse is 75. Her respiration is 20 and oxygen saturation is 97%.   Wt Readings from Last 3 Encounters:  08/16/23 209 lb (94.8 kg)  07/29/23 210 lb (95.3 kg)  04/29/23 214 lb (97.1 kg)    Physical Exam Vitals reviewed.  HENT:     Head: Normocephalic and atraumatic.     Ears:     Comments: She may have little bit of eczema or possibly psoriasis behind her ears. Eyes:     Pupils: Pupils are equal, round, and reactive to light.  Cardiovascular:     Rate and Rhythm: Normal rate and regular rhythm.     Heart sounds: Normal heart sounds.  Pulmonary:     Effort: Pulmonary effort is normal.     Breath sounds: Normal breath sounds.  Abdominal:     General: Bowel sounds are normal.     Palpations: Abdomen is soft.  Musculoskeletal:        General: No tenderness or deformity. Normal range of motion.     Cervical back: Normal range of motion.  Lymphadenopathy:     Cervical: No  cervical adenopathy.  Skin:    General: Skin is warm and dry.     Findings: No erythema or rash.     Comments: She has leg wrappings on both legs.  This is lower legs.  She has some chronic edema in the legs.  Neurological:     Mental Status: She is alert and oriented to  person, place, and time.  Psychiatric:        Behavior: Behavior normal.        Thought Content: Thought content normal.        Judgment: Judgment normal.      Lab Results  Component Value Date   WBC 8.2 08/16/2023   HGB 13.1 08/16/2023   HCT 40.2 08/16/2023   MCV 92.6 08/16/2023   PLT 203 08/16/2023   Lab Results  Component Value Date   FERRITIN 64.3 10/24/2022   IRON 55 10/24/2022   TIBC 291.2 10/24/2022   UIBC 207 08/21/2013   IRONPCTSAT 18.9 (L) 10/24/2022   Lab Results  Component Value Date   RBC 4.34 08/16/2023   No results found for: "KPAFRELGTCHN", "LAMBDASER", "KAPLAMBRATIO" No results found for: "IGGSERUM", "IGA", "IGMSERUM" No results found for: "TOTALPROTELP", "ALBUMINELP", "A1GS", "A2GS", "BETS", "BETA2SER", "GAMS", "MSPIKE", "SPEI"   Chemistry      Component Value Date/Time   NA 145 08/16/2023 1128   NA 149 (H) 08/15/2017 1039   NA 143 09/17/2016 1018   K 4.1 08/16/2023 1128   K 4.1 08/15/2017 1039   K 3.5 09/17/2016 1018   CL 104 08/16/2023 1128   CL 107 08/15/2017 1039   CO2 33 (H) 08/16/2023 1128   CO2 29 08/15/2017 1039   CO2 28 09/17/2016 1018   BUN 14 08/16/2023 1128   BUN 14 08/15/2017 1039   BUN 19.5 09/17/2016 1018   CREATININE 0.72 08/16/2023 1128   CREATININE 0.79 04/20/2022 1526   CREATININE 0.8 09/17/2016 1018      Component Value Date/Time   CALCIUM 9.7 08/16/2023 1128   CALCIUM 9.7 08/15/2017 1039   CALCIUM 9.8 09/17/2016 1018   ALKPHOS 48 08/16/2023 1128   ALKPHOS 48 08/15/2017 1039   ALKPHOS 71 09/17/2016 1018   AST 12 (L) 08/16/2023 1128   AST 10 09/17/2016 1018   ALT 19 08/16/2023 1128   ALT 43 08/15/2017 1039   ALT 18 09/17/2016 1018   BILITOT  0.6 08/16/2023 1128   BILITOT 0.39 09/17/2016 1018       Impression and Plan: Ms. Jansson is a very pleasant 77 yo caucasian female with history of stage IIIc melanoma if the right shoulder with resection. She had 8 positive lymph nodes and completed adjuvant interferon therapy in December 2012  followed by radiation to the right axilla.   I am just happy that she is doing much better now.  She really seems to be back to her old self.  I do not see any problems with respect to the melanoma.  Is now been 12 years since she had her adjuvant therapy.  As always, we will plan to get her back in 1 year.  Josph Macho, MD 10/11/202412:15 PM

## 2023-08-21 ENCOUNTER — Encounter (HOSPITAL_BASED_OUTPATIENT_CLINIC_OR_DEPARTMENT_OTHER): Payer: Medicare Other | Admitting: General Surgery

## 2023-08-21 DIAGNOSIS — L97822 Non-pressure chronic ulcer of other part of left lower leg with fat layer exposed: Secondary | ICD-10-CM | POA: Diagnosis not present

## 2023-08-21 DIAGNOSIS — Z86718 Personal history of other venous thrombosis and embolism: Secondary | ICD-10-CM | POA: Diagnosis not present

## 2023-08-21 DIAGNOSIS — L97222 Non-pressure chronic ulcer of left calf with fat layer exposed: Secondary | ICD-10-CM | POA: Diagnosis not present

## 2023-08-21 DIAGNOSIS — I10 Essential (primary) hypertension: Secondary | ICD-10-CM | POA: Diagnosis not present

## 2023-08-21 DIAGNOSIS — I871 Compression of vein: Secondary | ICD-10-CM | POA: Diagnosis not present

## 2023-08-21 DIAGNOSIS — I872 Venous insufficiency (chronic) (peripheral): Secondary | ICD-10-CM | POA: Diagnosis not present

## 2023-08-21 DIAGNOSIS — I83893 Varicose veins of bilateral lower extremities with other complications: Secondary | ICD-10-CM | POA: Diagnosis not present

## 2023-08-21 NOTE — Progress Notes (Signed)
[name], [name] ([id number]) [id number] Page 1 of 8 Visit Report for 08/21/2023 Arrival Information Details Patient Name: Date of Service: [name], [name] 08/21/2023 2:30 PM Medical Record Number: [id number] Patient Account Number: [id number] Date of Birth/Sex: Treating RN: 23-Feb-1946 (77 y.o. Gevena [name] Primary Care Cloyde Oregel: Eustaquio Boyden Other Clinician: Referring Kelvis Berger: Treating Laine Giovanetti/Extender: Priscille Loveless in Treatment: 66 Visit Information History Since Last Visit All ordered tests and consults were completed: Yes Patient Arrived: Ambulatory Added or deleted any medications: No Arrival Time: 14:42 Any new allergies or adverse reactions: No Accompanied By: self Had a fall or experienced change in No Transfer Assistance: None activities of daily living that may affect Patient Identification Verified: Yes risk of falls: Secondary Verification Process Completed: Yes Signs or symptoms of abuse/neglect since last visito No Patient Requires Transmission-Based Precautions: No Hospitalized since last visit: No Patient Has Alerts: No Implantable device outside of the clinic excluding No cellular tissue based products placed in the center since last visit: Has Dressing in Place as Prescribed: Yes Pain Present Now: No Electronic Signature(s) Signed: 08/21/2023 4:07:14 PM By: Brenton Grills Entered By: Brenton Grills on 08/21/2023 14:42:52 -------------------------------------------------------------------------------- Compression Therapy Details Patient Name: Date of Service: [name], [name] 08/21/2023 2:30 PM Medical Record Number: [id number] Patient Account Number: [id number] Date of Birth/Sex: Treating RN: 11-Jun-1946 (77 y.o. Gevena [name] Primary Care Donte Lenzo: Eustaquio Boyden Other Clinician: Referring Wilba Mutz: Treating Ezreal Turay/Extender: Priscille Loveless in Treatment:  55 Compression Therapy Performed for Wound Assessment: Wound #2 Left,Posterior Lower Leg Performed By: Clinician Brenton Grills, RN Compression Type: Three Layer Post Procedure Diagnosis Same as Pre-procedure Electronic Signature(s) Signed: 08/21/2023 4:07:14 PM By: Brenton Grills Entered By: Brenton Grills on 08/21/2023 15:06:41 [name] ([id number]) [id number] Page 2 of 8 -------------------------------------------------------------------------------- Encounter Discharge Information Details Patient Name: Date of Service: [name], [name] 08/21/2023 2:30 PM Medical Record Number: [id number] Patient Account Number: [id number] Date of Birth/Sex: Treating RN: [birth year] (77 y.o. Gevena [name] Primary Care Dasja Brase: Eustaquio Boyden Other Clinician: Referring Lucilia Yanni: Treating Gillis Boardley/Extender: Priscille Loveless in Treatment: 45 Encounter Discharge Information Items Post Procedure Vitals Discharge Condition: Stable Temperature (F): 98 Ambulatory Status: Cane Pulse (bpm): 74 Discharge Destination: Home Respiratory Rate (breaths/min): 18 Transportation: Private Auto Blood Pressure (mmHg): 128/74 Accompanied By: self Schedule Follow-up Appointment: Yes Clinical Summary of Care: Patient Declined Electronic Signature(s) Signed: 08/21/2023 4:07:14 PM By: Brenton Grills Entered By: Brenton Grills on 08/21/2023 15:21:29 -------------------------------------------------------------------------------- Lower Extremity Assessment Details Patient Name: Date of Service: [name], [name] 08/21/2023 2:30 PM Medical Record Number: [id number] Patient Account Number: [id number] Date of Birth/Sex: Treating RN: 1946-01-20 (77 y.o. Gevena [name] Primary Care Lylla Eifler: Eustaquio Boyden Other Clinician: Referring Dragan Tamburrino: Treating Nya Monds/Extender: Katharina Caper Weeks in Treatment: 55 Edema  Assessment Assessed: [name]: No] [Right: No] Edema: [Left: Ye] [Right: s] Calf Left: Right: Point of Measurement: 31 cm From Medial Instep 46.1 cm Ankle Left: Right: Point of Measurement: 9 cm From Medial Instep 23.3 cm Vascular Assessment Pulses: Dorsalis Pedis Palpable: [Left:Yes] Extremity colors, hair growth, and conditions: Extremity Color: [Left:Normal] Hair Growth on Extremity: [Left:Yes] Temperature of Extremity: [Left:Warm] Capillary Refill: [Left:< 3 seconds] Dependent Rubor: [Left:No No] Toe Nail Assessment Left: Right: Thick: Yes Discolored: Yes Deformed: Yes Improper Length and Hygiene: [name], [name] ([id number]) [id number] Page 3 of 8 Electronic Signature(s) Signed: 08/21/2023 4:07:14 PM By: Brenton Grills Entered By: Brenton Grills on 08/21/2023 14:46:48 -------------------------------------------------------------------------------- Multi Wound Chart Details Patient Name: Date of Service:  [name], [name] ([id number]) [id number] Page 1 of 8 Visit Report for 08/21/2023 Arrival Information Details Patient Name: Date of Service: [name], [name] 08/21/2023 2:30 PM Medical Record Number: [id number] Patient Account Number: [id number] Date of Birth/Sex: Treating RN: 23-Feb-1946 (77 y.o. Gevena [name] Primary Care Cloyde Oregel: Eustaquio Boyden Other Clinician: Referring Kelvis Berger: Treating Laine Giovanetti/Extender: Priscille Loveless in Treatment: 66 Visit Information History Since Last Visit All ordered tests and consults were completed: Yes Patient Arrived: Ambulatory Added or deleted any medications: No Arrival Time: 14:42 Any new allergies or adverse reactions: No Accompanied By: self Had a fall or experienced change in No Transfer Assistance: None activities of daily living that may affect Patient Identification Verified: Yes risk of falls: Secondary Verification Process Completed: Yes Signs or symptoms of abuse/neglect since last visito No Patient Requires Transmission-Based Precautions: No Hospitalized since last visit: No Patient Has Alerts: No Implantable device outside of the clinic excluding No cellular tissue based products placed in the center since last visit: Has Dressing in Place as Prescribed: Yes Pain Present Now: No Electronic Signature(s) Signed: 08/21/2023 4:07:14 PM By: Brenton Grills Entered By: Brenton Grills on 08/21/2023 14:42:52 -------------------------------------------------------------------------------- Compression Therapy Details Patient Name: Date of Service: [name], [name] 08/21/2023 2:30 PM Medical Record Number: [id number] Patient Account Number: [id number] Date of Birth/Sex: Treating RN: 11-Jun-1946 (77 y.o. Gevena [name] Primary Care Donte Lenzo: Eustaquio Boyden Other Clinician: Referring Wilba Mutz: Treating Ezreal Turay/Extender: Priscille Loveless in Treatment:  55 Compression Therapy Performed for Wound Assessment: Wound #2 Left,Posterior Lower Leg Performed By: Clinician Brenton Grills, RN Compression Type: Three Layer Post Procedure Diagnosis Same as Pre-procedure Electronic Signature(s) Signed: 08/21/2023 4:07:14 PM By: Brenton Grills Entered By: Brenton Grills on 08/21/2023 15:06:41 [name] ([id number]) [id number] Page 2 of 8 -------------------------------------------------------------------------------- Encounter Discharge Information Details Patient Name: Date of Service: [name], [name] 08/21/2023 2:30 PM Medical Record Number: [id number] Patient Account Number: [id number] Date of Birth/Sex: Treating RN: [birth year] (77 y.o. Gevena [name] Primary Care Dasja Brase: Eustaquio Boyden Other Clinician: Referring Lucilia Yanni: Treating Gillis Boardley/Extender: Priscille Loveless in Treatment: 45 Encounter Discharge Information Items Post Procedure Vitals Discharge Condition: Stable Temperature (F): 98 Ambulatory Status: Cane Pulse (bpm): 74 Discharge Destination: Home Respiratory Rate (breaths/min): 18 Transportation: Private Auto Blood Pressure (mmHg): 128/74 Accompanied By: self Schedule Follow-up Appointment: Yes Clinical Summary of Care: Patient Declined Electronic Signature(s) Signed: 08/21/2023 4:07:14 PM By: Brenton Grills Entered By: Brenton Grills on 08/21/2023 15:21:29 -------------------------------------------------------------------------------- Lower Extremity Assessment Details Patient Name: Date of Service: [name], [name] 08/21/2023 2:30 PM Medical Record Number: [id number] Patient Account Number: [id number] Date of Birth/Sex: Treating RN: 1946-01-20 (77 y.o. Gevena [name] Primary Care Lylla Eifler: Eustaquio Boyden Other Clinician: Referring Dragan Tamburrino: Treating Nya Monds/Extender: Katharina Caper Weeks in Treatment: 55 Edema  Assessment Assessed: [name]: No] [Right: No] Edema: [Left: Ye] [Right: s] Calf Left: Right: Point of Measurement: 31 cm From Medial Instep 46.1 cm Ankle Left: Right: Point of Measurement: 9 cm From Medial Instep 23.3 cm Vascular Assessment Pulses: Dorsalis Pedis Palpable: [Left:Yes] Extremity colors, hair growth, and conditions: Extremity Color: [Left:Normal] Hair Growth on Extremity: [Left:Yes] Temperature of Extremity: [Left:Warm] Capillary Refill: [Left:< 3 seconds] Dependent Rubor: [Left:No No] Toe Nail Assessment Left: Right: Thick: Yes Discolored: Yes Deformed: Yes Improper Length and Hygiene: [name], [name] ([id number]) [id number] Page 3 of 8 Electronic Signature(s) Signed: 08/21/2023 4:07:14 PM By: Brenton Grills Entered By: Brenton Grills on 08/21/2023 14:46:48 -------------------------------------------------------------------------------- Multi Wound Chart Details Patient Name: Date of Service:  Vitals Details Patient Name: Date of Service: [name], [name] 08/21/2023 2:30 PM Medical Record Number: [id number] Patient Account Number: [id number] Date of Birth/Sex: Treating RN: October 30, 1946 (77 y.o. Gevena [name] Primary Care [address]: Eustaquio Boyden Other Clinician: Referring Monika Chestang: Treating Longino Trefz/Extender: Priscille Loveless in Treatment: 55 Vital Signs Time Taken: 14:42 Temperature (F): 98 Height (in): 63 Pulse (bpm): 65 Weight (lbs): 200 Respiratory Rate (breaths/min): 18 Body Mass Index (BMI): 35.4 Blood Pressure (mmHg): 152/77 Reference Range: 80 - 120 mg / dl Electronic Signature(s) Signed: 08/21/2023 4:07:14 PM By: Izell Blairs, [name] ([id number]) [id number] Page 8 of 8 Entered By: Brenton Grills on 08/21/2023 14:46:21  Vitals Details Patient Name: Date of Service: [name], [name] 08/21/2023 2:30 PM Medical Record Number: [id number] Patient Account Number: [id number] Date of Birth/Sex: Treating RN: October 30, 1946 (77 y.o. Gevena [name] Primary Care [address]: Eustaquio Boyden Other Clinician: Referring Monika Chestang: Treating Longino Trefz/Extender: Priscille Loveless in Treatment: 55 Vital Signs Time Taken: 14:42 Temperature (F): 98 Height (in): 63 Pulse (bpm): 65 Weight (lbs): 200 Respiratory Rate (breaths/min): 18 Body Mass Index (BMI): 35.4 Blood Pressure (mmHg): 152/77 Reference Range: 80 - 120 mg / dl Electronic Signature(s) Signed: 08/21/2023 4:07:14 PM By: Izell Blairs, [name] ([id number]) [id number] Page 8 of 8 Entered By: Brenton Grills on 08/21/2023 14:46:21

## 2023-08-21 NOTE — Progress Notes (Signed)
is a little bit of slough overlying granulation tissue. [name], [name] ([id number]) [id number] Page 4 of 12 07/24/2023: The band of epithelium between the 2 open areas has gotten wider. The more proximal area has more epithelialization around the perimeter. Both have slough on the surface and remain fairly fibrotic. Moisture balance remains good. 08/07/2023: Both open areas are little bit smaller today. The band of epithelium between the 2 continues  to enlarge. There is slough and eschar on both surfaces. 08/14/2023: No significant change to the wound dimensions. The surface is rather dry and fibrotic again. 08/21/2023: The wound is smaller and the skin bridge between the 2 open areas has gotten wider. There is slough accumulation on the surfaces and the moisture balance is improved. Electronic Signature(s) Signed: 08/21/2023 3:09:11 PM By: [name] Guess MD [name] Entered By: [name] on 08/21/2023 15:09:11 -------------------------------------------------------------------------------- Physical Exam Details Patient Name: Date of Service: [name], [name] 08/21/2023 2:30 PM Medical Record Number: [id number] Patient Account Number: [id number] Date of Birth/Sex: Treating RN: 1945/11/24 (77 y.o. F) Primary Care Provider: Eustaquio Boyden Other Clinician: Referring Provider: Treating Provider/Extender: Priscille Loveless in Treatment: 64 Constitutional Hypertensive, asymptomatic. . . . no acute distress. Respiratory Normal work of breathing on room air. Notes 08/21/2023: The wound is smaller and the skin bridge between the 2 open areas has gotten wider. There is slough accumulation on the surfaces and the moisture balance is improved. Electronic Signature(s) Signed: 08/21/2023 3:12:40 PM By: [name] Guess MD [name] Entered By: [name] on 08/21/2023 15:12:40 -------------------------------------------------------------------------------- Physician Orders Details Patient Name: Date of Service: [name] 08/21/2023 2:30 PM Medical Record Number: [id number] Patient Account Number: [id number] Date of Birth/Sex: Treating RN: 1946/01/15 (77 y.o. [name] Primary Care Provider: Eustaquio Boyden Other Clinician: Referring Provider: Treating Provider/Extender: Priscille Loveless in Treatment: 8 Verbal / Phone Orders: No Diagnosis Coding ICD-10 Coding Code  Description [phone] Non-pressure chronic ulcer of other part of left lower leg with fat layer exposed I83.893 Varicose veins of bilateral lower extremities with other complications I87.2 Venous insufficiency (chronic) (peripheral) G62.9 Polyneuropathy, unspecified R60.0 Localized edema [name] ([id number]) [id number] Page 5 of 12 Follow-up Appointments ppointment in 1 week. - Dr. Lady Gary Room 3 - has appt. Return A Anesthetic Wound #2 Left,Posterior Lower Leg (In clinic) Topical Lidocaine 4% applied to wound bed Bathing/ Shower/ Hygiene May shower with protection but do not get wound dressing(s) wet. Protect dressing(s) with water repellant cover (for example, large plastic bag) or a cast cover and may then take shower. Edema Control - Lymphedema / SCD / Other Left Lower Extremity Lymphedema Pumps. Use Lymphedema pumps on leg(s) 2-3 times a day for 45-60 minutes. If wearing any wraps or hose, do not remove them. Continue exercising as instructed. - Use x1 per day Avoid standing for long periods of time. Exercise regularly Off-Loading Wound #2 Left,Posterior Lower Leg Other: - Elevate legs at heart level or above heart level while sitting. Additional Orders / Instructions Other: - Recommend Nail Care Wound Treatment Wound #2 - Lower Leg Wound Laterality: Left, Posterior Cleanser: Soap and Water 1 x Per Week/30 Days Discharge Instructions: May shower and wash wound with dial antibacterial soap and water prior to dressing change. Peri-Wound Care: Ketoconazole Cream 2% 1 x Per Week/30 Days Discharge Instructions: Apply Ketoconazole as directed Peri-Wound Care: Triamcinolone 15 (g) 1 x Per Week/30 Days Discharge Instructions: Use triamcinolone 15 (g) as directed Peri-Wound Care: Zinc Oxide Ointment 30g tube 1 x  Per Week/30 Days Discharge Instructions: Apply Zinc Oxide to periwound with each dressing change Peri-Wound Care: Sween Lotion (Moisturizing  lotion) 1 x Per Week/30 Days Discharge Instructions: Apply moisturizing lotion as directed Topical: Gentamicin 1 x Per Week/30 Days Discharge Instructions: As directed by physician Topical: Mupirocin Ointment 1 x Per Week/30 Days Discharge Instructions: Apply Mupirocin (Bactroban) as instructed Topical: Skintegrity Hydrogel 4 (oz) 1 x Per Week/30 Days Discharge Instructions: Apply hydrogel as directed Prim Dressing: Promogran Prisma Matrix, 4.34 (sq in) (silver collagen) 1 x Per Week/30 Days ary Discharge Instructions: Moisten collagen with saline or hydrogel Secondary Dressing: Optifoam Non-Adhesive Dressing, 4x4 in 1 x Per Week/30 Days Discharge Instructions: Apply over primary dressing as directed. Secondary Dressing: Zetuvit Plus 4x8 in 1 x Per Week/30 Days Discharge Instructions: Apply over primary dressing as directed. Compression Wrap: CoFlex Calamine Unna Boot 4 x 6 (in/yd) 1 x Per Week/30 Days Discharge Instructions: Apply Coflex Calamine [employer] as directed. Electronic Signature(s) Signed: 08/21/2023 3:20:58 PM By: [name] Guess MD [name] Entered By: [name] on 08/21/2023 15:14:56 [name] ([id number]) [id number] Page 6 of 12 -------------------------------------------------------------------------------- Problem List Details Patient Name: Date of Service: [name], [name] 08/21/2023 2:30 PM Medical Record Number: [id number] Patient Account Number: [id number] Date of Birth/Sex: Treating RN: 04/12/1946 (77 y.o. [name] Primary Care Provider: Eustaquio Boyden Other Clinician: Referring Provider: Treating Provider/Extender: Priscille Loveless in Treatment: 72 Active Problems ICD-10 Encounter Code Description Active Date MDM Diagnosis [id number] Non-pressure chronic ulcer of other part of left lower leg with fat layer exposed 07/27/2022 No Yes I83.893 Varicose veins of bilateral lower extremities  with other complications 07/27/2022 No Yes I87.2 Venous insufficiency (chronic) (peripheral) 07/27/2022 No Yes G62.9 Polyneuropathy, unspecified 07/27/2022 No Yes R60.0 Localized edema 07/27/2022 No Yes Inactive Problems ICD-10 Code Description Active Date Inactive Date L03.115 Cellulitis of right lower limb 10/22/2022 10/22/2022 L97.818 Non-pressure chronic ulcer of other part of right lower leg with other specified severity 03/01/2023 10/22/2022 Resolved Problems Electronic Signature(s) Signed: 08/21/2023 3:07:51 PM By: [name] Guess MD [name] Entered By: [name] on 08/21/2023 15:07:50 -------------------------------------------------------------------------------- Progress Note Details Patient Name: Date of Service: [name] 08/21/2023 2:30 PM Medical Record Number: [id number] Patient Account Number: [id number] Date of Birth/Sex: Treating RN: 07-11-1946 (77 y.o. F) Primary Care Provider: Eustaquio Boyden Other Clinician: Referring Provider: Treating Provider/Extender: Priscille Loveless in Treatment: 55 Subjective Chief Complaint Information obtained from Patient [name], [name] ([id number]) [id number] Page 7 of 12 Patient presents for treatment of an open ulcer due to venous insufficiency History of Present Illness (HPI) ADMISSION This is a 77 year old woman with a history of chronic venous insufficiency status post saphenous vein ablations in 2010 and 2016. 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 She presents today with an open venous ulcer on her left lower leg. It has been present for a little over 2 weeks. She saw her primary care provider who apparently swabbed the wound and grew out Pseudomonas. She just completed a course of ciprofloxacin for this. ABI was 0.91. She reports that she has had previous issues with ulcers in this same location, stemming back to a punch biopsy taken by a  dermatologist many years ago. She has had several skin substitutes applied to the area that have ultimately resulted in healing on prior occasions. She has 2 small ulcers on her left medial lower leg. There is slough accumulation in both of them. The more anterior of the 2 is  the surfaces and the moisture balance is improved. I used a curette to debride the slough from the wound surfaces. We will continue the mixture of topical gentamicin and mupirocin with hydrogel-moistened Prisma silver collagen, Optifoam cover and calamine-based Unna boot. Follow-up in 1 week. Electronic Signature(s) Signed: 08/21/2023 3:16:40 PM By: [name] Guess MD  [name] Entered By: [name] on 08/21/2023 15:16:40 -------------------------------------------------------------------------------- HxROS Details Patient Name: Date of Service: [name], MCLAURIN 08/21/2023 2:30 PM Medical Record Number: [id number] Patient Account Number: [id number] Date of Birth/Sex: Treating RN: 04-Sep-1946 (77 y.o. F) Primary Care Provider: Eustaquio Boyden Other Clinician: Referring Provider: Treating Provider/Extender: Katharina Caper Floyd, [name] ([id number]) [id number] Page 11 of 12 Weeks in Treatment: 55 Information Obtained From Patient Eyes Medical History: Positive for: Cataracts - L eye Ear/Nose/Mouth/Throat Medical History: Negative for: Chronic sinus problems/congestion; Middle ear problems Hematologic/Lymphatic Medical History: Negative for: Anemia; Human Immunodeficiency Virus; Lymphedema Respiratory Medical History: Positive for: Asthma Past Medical History Notes: OSA on CPAP Cardiovascular Medical History: Positive for: Hypertension Past Medical History Notes: Cronic venous insufficiency 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 Gastrointestinal Medical History: Past Medical History Notes: liver cyst Endocrine Medical History: Negative for: Type I Diabetes; Type II Diabetes Musculoskeletal Medical History: Past Medical History Notes: arthritis Neurologic Medical History: Positive for: Neuropathy - Feet and finger Negative for: Seizure Disorder Past Medical History Notes: restless leg syndrome Oncologic Medical History: Positive for: Received Chemotherapy - 2012; Received Radiation - 2012 HBO Extended History Items Eyes: Cataracts Immunizations Pneumococcal Vaccine: Received Pneumococcal Vaccination: Yes Received Pneumococcal Vaccination On or After 60th Birthday: Yes Implantable Devices No devices added [name], [name] ([id number])  [id number] Page 12 of 12 Hospitalization / Surgery History Type of Hospitalization/Surgery Lower extremity venography 2020 Intravascular ultrasound peripheral vascular intervention melanoma 2011 Family and Social History Cancer: Yes - Mother,Paternal Grandparents; Diabetes: Yes - Siblings; Heart Disease: Yes - Father; Hypertension: Yes - Father; Former smoker - quit in [employer]) Signed: 08/21/2023 3:20:58 PM By: [name] Guess MD [name] Entered By: [name] on 08/21/2023 15:12:08 -------------------------------------------------------------------------------- SuperBill Details Patient Name: Date of Service: [name], [name] 08/21/2023 Medical Record Number: [id number] Patient Account Number: [id number] Date of Birth/Sex: Treating RN: 04/11/1946 (77 y.o. F) Primary Care Provider: Eustaquio Boyden Other Clinician: Referring Provider: Treating Provider/Extender: Priscille Loveless in Treatment: 55 Diagnosis Coding ICD-10 Codes Code Description [phone] Non-pressure chronic ulcer of other part of left lower leg with fat layer exposed I83.893 Varicose veins of bilateral lower extremities with other complications I87.2 Venous insufficiency (chronic) (peripheral) G62.9 Polyneuropathy, unspecified R60.0 Localized edema Facility Procedures : CPT4 Code: [id number] Description: 97597 - DEBRIDE WOUND 1ST 20 SQ CM OR < ICD-10 Diagnosis Description L97.822 Non-pressure chronic ulcer of other part of left lower leg with fat layer expose Modifier: d Quantity: 1 Physician Procedures : CPT4 Code Description Modifier [id number] 99214 - WC PHYS LEVEL 4 - EST PT 25 ICD-10 Diagnosis Description L97.822 Non-pressure chronic ulcer of other part of left lower leg with fat layer exposed I83.893 Varicose veins of bilateral lower extremities with  other complications I87.2 Venous insufficiency (chronic) (peripheral) R60.0 Localized  edema Quantity: 1 : [id number] 97597 - WC PHYS DEBR WO ANESTH 20 SQ CM ICD-10 Diagnosis Description L97.822 Non-pressure chronic ulcer of other part of left lower leg with fat layer exposed Quantity: 1 Electronic Signature(s) Signed: 08/21/2023 3:16:59 PM By: [name] Guess MD [name] Entered By: [name] on 08/21/2023 15:16:58  is a little bit of slough overlying granulation tissue. [name], [name] ([id number]) [id number] Page 4 of 12 07/24/2023: The band of epithelium between the 2 open areas has gotten wider. The more proximal area has more epithelialization around the perimeter. Both have slough on the surface and remain fairly fibrotic. Moisture balance remains good. 08/07/2023: Both open areas are little bit smaller today. The band of epithelium between the 2 continues  to enlarge. There is slough and eschar on both surfaces. 08/14/2023: No significant change to the wound dimensions. The surface is rather dry and fibrotic again. 08/21/2023: The wound is smaller and the skin bridge between the 2 open areas has gotten wider. There is slough accumulation on the surfaces and the moisture balance is improved. Electronic Signature(s) Signed: 08/21/2023 3:09:11 PM By: [name] Guess MD [name] Entered By: [name] on 08/21/2023 15:09:11 -------------------------------------------------------------------------------- Physical Exam Details Patient Name: Date of Service: [name], [name] 08/21/2023 2:30 PM Medical Record Number: [id number] Patient Account Number: [id number] Date of Birth/Sex: Treating RN: 1945/11/24 (77 y.o. F) Primary Care Provider: Eustaquio Boyden Other Clinician: Referring Provider: Treating Provider/Extender: Priscille Loveless in Treatment: 64 Constitutional Hypertensive, asymptomatic. . . . no acute distress. Respiratory Normal work of breathing on room air. Notes 08/21/2023: The wound is smaller and the skin bridge between the 2 open areas has gotten wider. There is slough accumulation on the surfaces and the moisture balance is improved. Electronic Signature(s) Signed: 08/21/2023 3:12:40 PM By: [name] Guess MD [name] Entered By: [name] on 08/21/2023 15:12:40 -------------------------------------------------------------------------------- Physician Orders Details Patient Name: Date of Service: [name] 08/21/2023 2:30 PM Medical Record Number: [id number] Patient Account Number: [id number] Date of Birth/Sex: Treating RN: 1946/01/15 (77 y.o. [name] Primary Care Provider: Eustaquio Boyden Other Clinician: Referring Provider: Treating Provider/Extender: Priscille Loveless in Treatment: 8 Verbal / Phone Orders: No Diagnosis Coding ICD-10 Coding Code  Description [phone] Non-pressure chronic ulcer of other part of left lower leg with fat layer exposed I83.893 Varicose veins of bilateral lower extremities with other complications I87.2 Venous insufficiency (chronic) (peripheral) G62.9 Polyneuropathy, unspecified R60.0 Localized edema [name] ([id number]) [id number] Page 5 of 12 Follow-up Appointments ppointment in 1 week. - Dr. Lady Gary Room 3 - has appt. Return A Anesthetic Wound #2 Left,Posterior Lower Leg (In clinic) Topical Lidocaine 4% applied to wound bed Bathing/ Shower/ Hygiene May shower with protection but do not get wound dressing(s) wet. Protect dressing(s) with water repellant cover (for example, large plastic bag) or a cast cover and may then take shower. Edema Control - Lymphedema / SCD / Other Left Lower Extremity Lymphedema Pumps. Use Lymphedema pumps on leg(s) 2-3 times a day for 45-60 minutes. If wearing any wraps or hose, do not remove them. Continue exercising as instructed. - Use x1 per day Avoid standing for long periods of time. Exercise regularly Off-Loading Wound #2 Left,Posterior Lower Leg Other: - Elevate legs at heart level or above heart level while sitting. Additional Orders / Instructions Other: - Recommend Nail Care Wound Treatment Wound #2 - Lower Leg Wound Laterality: Left, Posterior Cleanser: Soap and Water 1 x Per Week/30 Days Discharge Instructions: May shower and wash wound with dial antibacterial soap and water prior to dressing change. Peri-Wound Care: Ketoconazole Cream 2% 1 x Per Week/30 Days Discharge Instructions: Apply Ketoconazole as directed Peri-Wound Care: Triamcinolone 15 (g) 1 x Per Week/30 Days Discharge Instructions: Use triamcinolone 15 (g) as directed Peri-Wound Care: Zinc Oxide Ointment 30g tube 1 x  is a little bit of slough overlying granulation tissue. [name], [name] ([id number]) [id number] Page 4 of 12 07/24/2023: The band of epithelium between the 2 open areas has gotten wider. The more proximal area has more epithelialization around the perimeter. Both have slough on the surface and remain fairly fibrotic. Moisture balance remains good. 08/07/2023: Both open areas are little bit smaller today. The band of epithelium between the 2 continues  to enlarge. There is slough and eschar on both surfaces. 08/14/2023: No significant change to the wound dimensions. The surface is rather dry and fibrotic again. 08/21/2023: The wound is smaller and the skin bridge between the 2 open areas has gotten wider. There is slough accumulation on the surfaces and the moisture balance is improved. Electronic Signature(s) Signed: 08/21/2023 3:09:11 PM By: [name] Guess MD [name] Entered By: [name] on 08/21/2023 15:09:11 -------------------------------------------------------------------------------- Physical Exam Details Patient Name: Date of Service: [name], [name] 08/21/2023 2:30 PM Medical Record Number: [id number] Patient Account Number: [id number] Date of Birth/Sex: Treating RN: 1945/11/24 (77 y.o. F) Primary Care Provider: Eustaquio Boyden Other Clinician: Referring Provider: Treating Provider/Extender: Priscille Loveless in Treatment: 64 Constitutional Hypertensive, asymptomatic. . . . no acute distress. Respiratory Normal work of breathing on room air. Notes 08/21/2023: The wound is smaller and the skin bridge between the 2 open areas has gotten wider. There is slough accumulation on the surfaces and the moisture balance is improved. Electronic Signature(s) Signed: 08/21/2023 3:12:40 PM By: [name] Guess MD [name] Entered By: [name] on 08/21/2023 15:12:40 -------------------------------------------------------------------------------- Physician Orders Details Patient Name: Date of Service: [name] 08/21/2023 2:30 PM Medical Record Number: [id number] Patient Account Number: [id number] Date of Birth/Sex: Treating RN: 1946/01/15 (77 y.o. [name] Primary Care Provider: Eustaquio Boyden Other Clinician: Referring Provider: Treating Provider/Extender: Priscille Loveless in Treatment: 8 Verbal / Phone Orders: No Diagnosis Coding ICD-10 Coding Code  Description [phone] Non-pressure chronic ulcer of other part of left lower leg with fat layer exposed I83.893 Varicose veins of bilateral lower extremities with other complications I87.2 Venous insufficiency (chronic) (peripheral) G62.9 Polyneuropathy, unspecified R60.0 Localized edema [name] ([id number]) [id number] Page 5 of 12 Follow-up Appointments ppointment in 1 week. - Dr. Lady Gary Room 3 - has appt. Return A Anesthetic Wound #2 Left,Posterior Lower Leg (In clinic) Topical Lidocaine 4% applied to wound bed Bathing/ Shower/ Hygiene May shower with protection but do not get wound dressing(s) wet. Protect dressing(s) with water repellant cover (for example, large plastic bag) or a cast cover and may then take shower. Edema Control - Lymphedema / SCD / Other Left Lower Extremity Lymphedema Pumps. Use Lymphedema pumps on leg(s) 2-3 times a day for 45-60 minutes. If wearing any wraps or hose, do not remove them. Continue exercising as instructed. - Use x1 per day Avoid standing for long periods of time. Exercise regularly Off-Loading Wound #2 Left,Posterior Lower Leg Other: - Elevate legs at heart level or above heart level while sitting. Additional Orders / Instructions Other: - Recommend Nail Care Wound Treatment Wound #2 - Lower Leg Wound Laterality: Left, Posterior Cleanser: Soap and Water 1 x Per Week/30 Days Discharge Instructions: May shower and wash wound with dial antibacterial soap and water prior to dressing change. Peri-Wound Care: Ketoconazole Cream 2% 1 x Per Week/30 Days Discharge Instructions: Apply Ketoconazole as directed Peri-Wound Care: Triamcinolone 15 (g) 1 x Per Week/30 Days Discharge Instructions: Use triamcinolone 15 (g) as directed Peri-Wound Care: Zinc Oxide Ointment 30g tube 1 x  is a little bit of slough overlying granulation tissue. [name], [name] ([id number]) [id number] Page 4 of 12 07/24/2023: The band of epithelium between the 2 open areas has gotten wider. The more proximal area has more epithelialization around the perimeter. Both have slough on the surface and remain fairly fibrotic. Moisture balance remains good. 08/07/2023: Both open areas are little bit smaller today. The band of epithelium between the 2 continues  to enlarge. There is slough and eschar on both surfaces. 08/14/2023: No significant change to the wound dimensions. The surface is rather dry and fibrotic again. 08/21/2023: The wound is smaller and the skin bridge between the 2 open areas has gotten wider. There is slough accumulation on the surfaces and the moisture balance is improved. Electronic Signature(s) Signed: 08/21/2023 3:09:11 PM By: [name] Guess MD [name] Entered By: [name] on 08/21/2023 15:09:11 -------------------------------------------------------------------------------- Physical Exam Details Patient Name: Date of Service: [name], [name] 08/21/2023 2:30 PM Medical Record Number: [id number] Patient Account Number: [id number] Date of Birth/Sex: Treating RN: 1945/11/24 (77 y.o. F) Primary Care Provider: Eustaquio Boyden Other Clinician: Referring Provider: Treating Provider/Extender: Priscille Loveless in Treatment: 64 Constitutional Hypertensive, asymptomatic. . . . no acute distress. Respiratory Normal work of breathing on room air. Notes 08/21/2023: The wound is smaller and the skin bridge between the 2 open areas has gotten wider. There is slough accumulation on the surfaces and the moisture balance is improved. Electronic Signature(s) Signed: 08/21/2023 3:12:40 PM By: [name] Guess MD [name] Entered By: [name] on 08/21/2023 15:12:40 -------------------------------------------------------------------------------- Physician Orders Details Patient Name: Date of Service: [name] 08/21/2023 2:30 PM Medical Record Number: [id number] Patient Account Number: [id number] Date of Birth/Sex: Treating RN: 1946/01/15 (77 y.o. [name] Primary Care Provider: Eustaquio Boyden Other Clinician: Referring Provider: Treating Provider/Extender: Priscille Loveless in Treatment: 8 Verbal / Phone Orders: No Diagnosis Coding ICD-10 Coding Code  Description [phone] Non-pressure chronic ulcer of other part of left lower leg with fat layer exposed I83.893 Varicose veins of bilateral lower extremities with other complications I87.2 Venous insufficiency (chronic) (peripheral) G62.9 Polyneuropathy, unspecified R60.0 Localized edema [name] ([id number]) [id number] Page 5 of 12 Follow-up Appointments ppointment in 1 week. - Dr. Lady Gary Room 3 - has appt. Return A Anesthetic Wound #2 Left,Posterior Lower Leg (In clinic) Topical Lidocaine 4% applied to wound bed Bathing/ Shower/ Hygiene May shower with protection but do not get wound dressing(s) wet. Protect dressing(s) with water repellant cover (for example, large plastic bag) or a cast cover and may then take shower. Edema Control - Lymphedema / SCD / Other Left Lower Extremity Lymphedema Pumps. Use Lymphedema pumps on leg(s) 2-3 times a day for 45-60 minutes. If wearing any wraps or hose, do not remove them. Continue exercising as instructed. - Use x1 per day Avoid standing for long periods of time. Exercise regularly Off-Loading Wound #2 Left,Posterior Lower Leg Other: - Elevate legs at heart level or above heart level while sitting. Additional Orders / Instructions Other: - Recommend Nail Care Wound Treatment Wound #2 - Lower Leg Wound Laterality: Left, Posterior Cleanser: Soap and Water 1 x Per Week/30 Days Discharge Instructions: May shower and wash wound with dial antibacterial soap and water prior to dressing change. Peri-Wound Care: Ketoconazole Cream 2% 1 x Per Week/30 Days Discharge Instructions: Apply Ketoconazole as directed Peri-Wound Care: Triamcinolone 15 (g) 1 x Per Week/30 Days Discharge Instructions: Use triamcinolone 15 (g) as directed Peri-Wound Care: Zinc Oxide Ointment 30g tube 1 x  is a little bit of slough overlying granulation tissue. [name], [name] ([id number]) [id number] Page 4 of 12 07/24/2023: The band of epithelium between the 2 open areas has gotten wider. The more proximal area has more epithelialization around the perimeter. Both have slough on the surface and remain fairly fibrotic. Moisture balance remains good. 08/07/2023: Both open areas are little bit smaller today. The band of epithelium between the 2 continues  to enlarge. There is slough and eschar on both surfaces. 08/14/2023: No significant change to the wound dimensions. The surface is rather dry and fibrotic again. 08/21/2023: The wound is smaller and the skin bridge between the 2 open areas has gotten wider. There is slough accumulation on the surfaces and the moisture balance is improved. Electronic Signature(s) Signed: 08/21/2023 3:09:11 PM By: [name] Guess MD [name] Entered By: [name] on 08/21/2023 15:09:11 -------------------------------------------------------------------------------- Physical Exam Details Patient Name: Date of Service: [name], [name] 08/21/2023 2:30 PM Medical Record Number: [id number] Patient Account Number: [id number] Date of Birth/Sex: Treating RN: 1945/11/24 (77 y.o. F) Primary Care Provider: Eustaquio Boyden Other Clinician: Referring Provider: Treating Provider/Extender: Priscille Loveless in Treatment: 64 Constitutional Hypertensive, asymptomatic. . . . no acute distress. Respiratory Normal work of breathing on room air. Notes 08/21/2023: The wound is smaller and the skin bridge between the 2 open areas has gotten wider. There is slough accumulation on the surfaces and the moisture balance is improved. Electronic Signature(s) Signed: 08/21/2023 3:12:40 PM By: [name] Guess MD [name] Entered By: [name] on 08/21/2023 15:12:40 -------------------------------------------------------------------------------- Physician Orders Details Patient Name: Date of Service: [name] 08/21/2023 2:30 PM Medical Record Number: [id number] Patient Account Number: [id number] Date of Birth/Sex: Treating RN: 1946/01/15 (77 y.o. [name] Primary Care Provider: Eustaquio Boyden Other Clinician: Referring Provider: Treating Provider/Extender: Priscille Loveless in Treatment: 8 Verbal / Phone Orders: No Diagnosis Coding ICD-10 Coding Code  Description [phone] Non-pressure chronic ulcer of other part of left lower leg with fat layer exposed I83.893 Varicose veins of bilateral lower extremities with other complications I87.2 Venous insufficiency (chronic) (peripheral) G62.9 Polyneuropathy, unspecified R60.0 Localized edema [name] ([id number]) [id number] Page 5 of 12 Follow-up Appointments ppointment in 1 week. - Dr. Lady Gary Room 3 - has appt. Return A Anesthetic Wound #2 Left,Posterior Lower Leg (In clinic) Topical Lidocaine 4% applied to wound bed Bathing/ Shower/ Hygiene May shower with protection but do not get wound dressing(s) wet. Protect dressing(s) with water repellant cover (for example, large plastic bag) or a cast cover and may then take shower. Edema Control - Lymphedema / SCD / Other Left Lower Extremity Lymphedema Pumps. Use Lymphedema pumps on leg(s) 2-3 times a day for 45-60 minutes. If wearing any wraps or hose, do not remove them. Continue exercising as instructed. - Use x1 per day Avoid standing for long periods of time. Exercise regularly Off-Loading Wound #2 Left,Posterior Lower Leg Other: - Elevate legs at heart level or above heart level while sitting. Additional Orders / Instructions Other: - Recommend Nail Care Wound Treatment Wound #2 - Lower Leg Wound Laterality: Left, Posterior Cleanser: Soap and Water 1 x Per Week/30 Days Discharge Instructions: May shower and wash wound with dial antibacterial soap and water prior to dressing change. Peri-Wound Care: Ketoconazole Cream 2% 1 x Per Week/30 Days Discharge Instructions: Apply Ketoconazole as directed Peri-Wound Care: Triamcinolone 15 (g) 1 x Per Week/30 Days Discharge Instructions: Use triamcinolone 15 (g) as directed Peri-Wound Care: Zinc Oxide Ointment 30g tube 1 x  Per Week/30 Days Discharge Instructions: Apply Zinc Oxide to periwound with each dressing change Peri-Wound Care: Sween Lotion (Moisturizing  lotion) 1 x Per Week/30 Days Discharge Instructions: Apply moisturizing lotion as directed Topical: Gentamicin 1 x Per Week/30 Days Discharge Instructions: As directed by physician Topical: Mupirocin Ointment 1 x Per Week/30 Days Discharge Instructions: Apply Mupirocin (Bactroban) as instructed Topical: Skintegrity Hydrogel 4 (oz) 1 x Per Week/30 Days Discharge Instructions: Apply hydrogel as directed Prim Dressing: Promogran Prisma Matrix, 4.34 (sq in) (silver collagen) 1 x Per Week/30 Days ary Discharge Instructions: Moisten collagen with saline or hydrogel Secondary Dressing: Optifoam Non-Adhesive Dressing, 4x4 in 1 x Per Week/30 Days Discharge Instructions: Apply over primary dressing as directed. Secondary Dressing: Zetuvit Plus 4x8 in 1 x Per Week/30 Days Discharge Instructions: Apply over primary dressing as directed. Compression Wrap: CoFlex Calamine Unna Boot 4 x 6 (in/yd) 1 x Per Week/30 Days Discharge Instructions: Apply Coflex Calamine [employer] as directed. Electronic Signature(s) Signed: 08/21/2023 3:20:58 PM By: [name] Guess MD [name] Entered By: [name] on 08/21/2023 15:14:56 [name] ([id number]) [id number] Page 6 of 12 -------------------------------------------------------------------------------- Problem List Details Patient Name: Date of Service: [name], [name] 08/21/2023 2:30 PM Medical Record Number: [id number] Patient Account Number: [id number] Date of Birth/Sex: Treating RN: 04/12/1946 (77 y.o. [name] Primary Care Provider: Eustaquio Boyden Other Clinician: Referring Provider: Treating Provider/Extender: Priscille Loveless in Treatment: 72 Active Problems ICD-10 Encounter Code Description Active Date MDM Diagnosis [id number] Non-pressure chronic ulcer of other part of left lower leg with fat layer exposed 07/27/2022 No Yes I83.893 Varicose veins of bilateral lower extremities  with other complications 07/27/2022 No Yes I87.2 Venous insufficiency (chronic) (peripheral) 07/27/2022 No Yes G62.9 Polyneuropathy, unspecified 07/27/2022 No Yes R60.0 Localized edema 07/27/2022 No Yes Inactive Problems ICD-10 Code Description Active Date Inactive Date L03.115 Cellulitis of right lower limb 10/22/2022 10/22/2022 L97.818 Non-pressure chronic ulcer of other part of right lower leg with other specified severity 03/01/2023 10/22/2022 Resolved Problems Electronic Signature(s) Signed: 08/21/2023 3:07:51 PM By: [name] Guess MD [name] Entered By: [name] on 08/21/2023 15:07:50 -------------------------------------------------------------------------------- Progress Note Details Patient Name: Date of Service: [name] 08/21/2023 2:30 PM Medical Record Number: [id number] Patient Account Number: [id number] Date of Birth/Sex: Treating RN: 07-11-1946 (77 y.o. F) Primary Care Provider: Eustaquio Boyden Other Clinician: Referring Provider: Treating Provider/Extender: Priscille Loveless in Treatment: 55 Subjective Chief Complaint Information obtained from Patient [name], [name] ([id number]) [id number] Page 7 of 12 Patient presents for treatment of an open ulcer due to venous insufficiency History of Present Illness (HPI) ADMISSION This is a 77 year old woman with a history of chronic venous insufficiency status post saphenous vein ablations in 2010 and 2016. 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 She presents today with an open venous ulcer on her left lower leg. It has been present for a little over 2 weeks. She saw her primary care provider who apparently swabbed the wound and grew out Pseudomonas. She just completed a course of ciprofloxacin for this. ABI was 0.91. She reports that she has had previous issues with ulcers in this same location, stemming back to a punch biopsy taken by a  dermatologist many years ago. She has had several skin substitutes applied to the area that have ultimately resulted in healing on prior occasions. She has 2 small ulcers on her left medial lower leg. There is slough accumulation in both of them. The more anterior of the 2 is  is a little bit of slough overlying granulation tissue. [name], [name] ([id number]) [id number] Page 4 of 12 07/24/2023: The band of epithelium between the 2 open areas has gotten wider. The more proximal area has more epithelialization around the perimeter. Both have slough on the surface and remain fairly fibrotic. Moisture balance remains good. 08/07/2023: Both open areas are little bit smaller today. The band of epithelium between the 2 continues  to enlarge. There is slough and eschar on both surfaces. 08/14/2023: No significant change to the wound dimensions. The surface is rather dry and fibrotic again. 08/21/2023: The wound is smaller and the skin bridge between the 2 open areas has gotten wider. There is slough accumulation on the surfaces and the moisture balance is improved. Electronic Signature(s) Signed: 08/21/2023 3:09:11 PM By: [name] Guess MD [name] Entered By: [name] on 08/21/2023 15:09:11 -------------------------------------------------------------------------------- Physical Exam Details Patient Name: Date of Service: [name], [name] 08/21/2023 2:30 PM Medical Record Number: [id number] Patient Account Number: [id number] Date of Birth/Sex: Treating RN: 1945/11/24 (77 y.o. F) Primary Care Provider: Eustaquio Boyden Other Clinician: Referring Provider: Treating Provider/Extender: Priscille Loveless in Treatment: 64 Constitutional Hypertensive, asymptomatic. . . . no acute distress. Respiratory Normal work of breathing on room air. Notes 08/21/2023: The wound is smaller and the skin bridge between the 2 open areas has gotten wider. There is slough accumulation on the surfaces and the moisture balance is improved. Electronic Signature(s) Signed: 08/21/2023 3:12:40 PM By: [name] Guess MD [name] Entered By: [name] on 08/21/2023 15:12:40 -------------------------------------------------------------------------------- Physician Orders Details Patient Name: Date of Service: [name] 08/21/2023 2:30 PM Medical Record Number: [id number] Patient Account Number: [id number] Date of Birth/Sex: Treating RN: 1946/01/15 (77 y.o. [name] Primary Care Provider: Eustaquio Boyden Other Clinician: Referring Provider: Treating Provider/Extender: Priscille Loveless in Treatment: 8 Verbal / Phone Orders: No Diagnosis Coding ICD-10 Coding Code  Description [phone] Non-pressure chronic ulcer of other part of left lower leg with fat layer exposed I83.893 Varicose veins of bilateral lower extremities with other complications I87.2 Venous insufficiency (chronic) (peripheral) G62.9 Polyneuropathy, unspecified R60.0 Localized edema [name] ([id number]) [id number] Page 5 of 12 Follow-up Appointments ppointment in 1 week. - Dr. Lady Gary Room 3 - has appt. Return A Anesthetic Wound #2 Left,Posterior Lower Leg (In clinic) Topical Lidocaine 4% applied to wound bed Bathing/ Shower/ Hygiene May shower with protection but do not get wound dressing(s) wet. Protect dressing(s) with water repellant cover (for example, large plastic bag) or a cast cover and may then take shower. Edema Control - Lymphedema / SCD / Other Left Lower Extremity Lymphedema Pumps. Use Lymphedema pumps on leg(s) 2-3 times a day for 45-60 minutes. If wearing any wraps or hose, do not remove them. Continue exercising as instructed. - Use x1 per day Avoid standing for long periods of time. Exercise regularly Off-Loading Wound #2 Left,Posterior Lower Leg Other: - Elevate legs at heart level or above heart level while sitting. Additional Orders / Instructions Other: - Recommend Nail Care Wound Treatment Wound #2 - Lower Leg Wound Laterality: Left, Posterior Cleanser: Soap and Water 1 x Per Week/30 Days Discharge Instructions: May shower and wash wound with dial antibacterial soap and water prior to dressing change. Peri-Wound Care: Ketoconazole Cream 2% 1 x Per Week/30 Days Discharge Instructions: Apply Ketoconazole as directed Peri-Wound Care: Triamcinolone 15 (g) 1 x Per Week/30 Days Discharge Instructions: Use triamcinolone 15 (g) as directed Peri-Wound Care: Zinc Oxide Ointment 30g tube 1 x  [name], [name] ([id number]) [id number] Page 1 of 12 Visit Report for 08/21/2023 Chief Complaint Document Details Patient Name: Date of Service: [name], [name] 08/21/2023 2:30 PM Medical Record Number: [id number] Patient Account Number: [id number] Date of Birth/Sex: Treating RN: 07/30/1946 (77 y.o. F) Primary Care Provider: Eustaquio Boyden Other Clinician: Referring Provider: Treating Provider/Extender: Priscille Loveless in Treatment: 30 Information Obtained from: Patient Chief Complaint Patient presents for treatment of an open ulcer due to venous insufficiency Electronic Signature(s) Signed: 08/21/2023 3:08:34 PM By: [name] Guess MD [name] Entered By: [name] on 08/21/2023 15:08:34 -------------------------------------------------------------------------------- Debridement Details Patient Name: Date of Service: [name], [name] 08/21/2023 2:30 PM Medical Record Number: [id number] Patient Account Number: [id number] Date of Birth/Sex: Treating RN: 29-[name]-1946 (77 y.o. [name] Primary Care Provider: Eustaquio Boyden Other Clinician: Referring Provider: Treating Provider/Extender: Priscille Loveless in Treatment: 55 Debridement Performed for Assessment: Wound #2 Left,Posterior Lower Leg Performed By: Physician [name] Guess, MD The following information was scribed by: Brenton Grills The information was scribed for: [name] Debridement Type: Debridement Severity of Tissue Pre Debridement: Fat layer exposed Level of Consciousness (Pre-procedure): Awake and Alert Pre-procedure Verification/Time Out Yes - 15:04 Taken: Start Time: 15:05 Pain Control: Lidocaine 4% Topical Solution Percent of Wound Bed Debrided: 100% T Area Debrided (cm): otal 2.4 Tissue and other material debrided: Non-Viable, Eschar, Slough, Slough Level: Non-Viable Tissue Debridement Description:  Selective/Open Wound Instrument: Curette Bleeding: Minimum Hemostasis Achieved: Pressure Response to Treatment: Procedure was tolerated well Level of Consciousness (Post- Awake and Alert procedure): Post Debridement Measurements of Total Wound Length: (cm) 1.7 Width: (cm) 1.8 Depth: (cm) 0.2 Volume: (cm) 0.481 Character of Wound/Ulcer Post Debridement: Improved Severity of Tissue Post Debridement: Fat layer exposed [name], [name] ([id number]) [id number] Page 2 of 12 Post Procedure Diagnosis Same as Pre-procedure Electronic Signature(s) Signed: 08/21/2023 3:20:58 PM By: [name] Guess MD [name] Signed: 08/21/2023 4:07:14 PM By: Brenton Grills Entered By: Brenton Grills on 08/21/2023 15:05:59 -------------------------------------------------------------------------------- HPI Details Patient Name: Date of Service: [name], [name] 08/21/2023 2:30 PM Medical Record Number: [id number] Patient Account Number: [id number] Date of Birth/Sex: Treating RN: [birth year] (77 y.o. F) Primary Care Provider: Eustaquio Boyden Other Clinician: Referring Provider: Treating Provider/Extender: Priscille Loveless in Treatment: 50 History of Present Illness HPI Description: ADMISSION This is a 77 year old woman with a history of chronic venous insufficiency status post saphenous vein ablations in 2010 and 2016. 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 She presents today with an open venous ulcer on her left lower leg. It has been present for a little over 2 weeks. She saw her primary care provider who apparently swabbed the wound and grew out Pseudomonas. She just completed a course of ciprofloxacin for this. ABI was 0.91. She reports that she has had previous issues with ulcers in this same location, stemming back to a punch biopsy taken by a dermatologist many years ago. She has had several skin substitutes applied to the  area that have ultimately resulted in healing on prior occasions. She has 2 small ulcers on her left medial lower leg. There is slough accumulation in both of them. The more anterior of the 2 is quite small and has some soft slough on the surface, underneath which there is good granulation tissue. The more medial wound also has slough accumulation, but the underlying surface is fairly fibrotic and gritty. This is consistent with her provided history of multiple ulcers in the same location. 08/03/2022:  the surfaces and the moisture balance is improved. I used a curette to debride the slough from the wound surfaces. We will continue the mixture of topical gentamicin and mupirocin with hydrogel-moistened Prisma silver collagen, Optifoam cover and calamine-based Unna boot. Follow-up in 1 week. Electronic Signature(s) Signed: 08/21/2023 3:16:40 PM By: [name] Guess MD  [name] Entered By: [name] on 08/21/2023 15:16:40 -------------------------------------------------------------------------------- HxROS Details Patient Name: Date of Service: [name], MCLAURIN 08/21/2023 2:30 PM Medical Record Number: [id number] Patient Account Number: [id number] Date of Birth/Sex: Treating RN: 04-Sep-1946 (77 y.o. F) Primary Care Provider: Eustaquio Boyden Other Clinician: Referring Provider: Treating Provider/Extender: Katharina Caper Floyd, [name] ([id number]) [id number] Page 11 of 12 Weeks in Treatment: 55 Information Obtained From Patient Eyes Medical History: Positive for: Cataracts - L eye Ear/Nose/Mouth/Throat Medical History: Negative for: Chronic sinus problems/congestion; Middle ear problems Hematologic/Lymphatic Medical History: Negative for: Anemia; Human Immunodeficiency Virus; Lymphedema Respiratory Medical History: Positive for: Asthma Past Medical History Notes: OSA on CPAP Cardiovascular Medical History: Positive for: Hypertension Past Medical History Notes: Cronic venous insufficiency 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 Gastrointestinal Medical History: Past Medical History Notes: liver cyst Endocrine Medical History: Negative for: Type I Diabetes; Type II Diabetes Musculoskeletal Medical History: Past Medical History Notes: arthritis Neurologic Medical History: Positive for: Neuropathy - Feet and finger Negative for: Seizure Disorder Past Medical History Notes: restless leg syndrome Oncologic Medical History: Positive for: Received Chemotherapy - 2012; Received Radiation - 2012 HBO Extended History Items Eyes: Cataracts Immunizations Pneumococcal Vaccine: Received Pneumococcal Vaccination: Yes Received Pneumococcal Vaccination On or After 60th Birthday: Yes Implantable Devices No devices added [name], [name] ([id number])  [id number] Page 12 of 12 Hospitalization / Surgery History Type of Hospitalization/Surgery Lower extremity venography 2020 Intravascular ultrasound peripheral vascular intervention melanoma 2011 Family and Social History Cancer: Yes - Mother,Paternal Grandparents; Diabetes: Yes - Siblings; Heart Disease: Yes - Father; Hypertension: Yes - Father; Former smoker - quit in [employer]) Signed: 08/21/2023 3:20:58 PM By: [name] Guess MD [name] Entered By: [name] on 08/21/2023 15:12:08 -------------------------------------------------------------------------------- SuperBill Details Patient Name: Date of Service: [name], [name] 08/21/2023 Medical Record Number: [id number] Patient Account Number: [id number] Date of Birth/Sex: Treating RN: 04/11/1946 (77 y.o. F) Primary Care Provider: Eustaquio Boyden Other Clinician: Referring Provider: Treating Provider/Extender: Priscille Loveless in Treatment: 55 Diagnosis Coding ICD-10 Codes Code Description [phone] Non-pressure chronic ulcer of other part of left lower leg with fat layer exposed I83.893 Varicose veins of bilateral lower extremities with other complications I87.2 Venous insufficiency (chronic) (peripheral) G62.9 Polyneuropathy, unspecified R60.0 Localized edema Facility Procedures : CPT4 Code: [id number] Description: 97597 - DEBRIDE WOUND 1ST 20 SQ CM OR < ICD-10 Diagnosis Description L97.822 Non-pressure chronic ulcer of other part of left lower leg with fat layer expose Modifier: d Quantity: 1 Physician Procedures : CPT4 Code Description Modifier [id number] 99214 - WC PHYS LEVEL 4 - EST PT 25 ICD-10 Diagnosis Description L97.822 Non-pressure chronic ulcer of other part of left lower leg with fat layer exposed I83.893 Varicose veins of bilateral lower extremities with  other complications I87.2 Venous insufficiency (chronic) (peripheral) R60.0 Localized  edema Quantity: 1 : [id number] 97597 - WC PHYS DEBR WO ANESTH 20 SQ CM ICD-10 Diagnosis Description L97.822 Non-pressure chronic ulcer of other part of left lower leg with fat layer exposed Quantity: 1 Electronic Signature(s) Signed: 08/21/2023 3:16:59 PM By: [name] Guess MD [name] Entered By: [name] on 08/21/2023 15:16:58  [name], [name] ([id number]) [id number] Page 1 of 12 Visit Report for 08/21/2023 Chief Complaint Document Details Patient Name: Date of Service: [name], [name] 08/21/2023 2:30 PM Medical Record Number: [id number] Patient Account Number: [id number] Date of Birth/Sex: Treating RN: 07/30/1946 (77 y.o. F) Primary Care Provider: Eustaquio Boyden Other Clinician: Referring Provider: Treating Provider/Extender: Priscille Loveless in Treatment: 30 Information Obtained from: Patient Chief Complaint Patient presents for treatment of an open ulcer due to venous insufficiency Electronic Signature(s) Signed: 08/21/2023 3:08:34 PM By: [name] Guess MD [name] Entered By: [name] on 08/21/2023 15:08:34 -------------------------------------------------------------------------------- Debridement Details Patient Name: Date of Service: [name], [name] 08/21/2023 2:30 PM Medical Record Number: [id number] Patient Account Number: [id number] Date of Birth/Sex: Treating RN: 29-[name]-1946 (77 y.o. [name] Primary Care Provider: Eustaquio Boyden Other Clinician: Referring Provider: Treating Provider/Extender: Priscille Loveless in Treatment: 55 Debridement Performed for Assessment: Wound #2 Left,Posterior Lower Leg Performed By: Physician [name] Guess, MD The following information was scribed by: Brenton Grills The information was scribed for: [name] Debridement Type: Debridement Severity of Tissue Pre Debridement: Fat layer exposed Level of Consciousness (Pre-procedure): Awake and Alert Pre-procedure Verification/Time Out Yes - 15:04 Taken: Start Time: 15:05 Pain Control: Lidocaine 4% Topical Solution Percent of Wound Bed Debrided: 100% T Area Debrided (cm): otal 2.4 Tissue and other material debrided: Non-Viable, Eschar, Slough, Slough Level: Non-Viable Tissue Debridement Description:  Selective/Open Wound Instrument: Curette Bleeding: Minimum Hemostasis Achieved: Pressure Response to Treatment: Procedure was tolerated well Level of Consciousness (Post- Awake and Alert procedure): Post Debridement Measurements of Total Wound Length: (cm) 1.7 Width: (cm) 1.8 Depth: (cm) 0.2 Volume: (cm) 0.481 Character of Wound/Ulcer Post Debridement: Improved Severity of Tissue Post Debridement: Fat layer exposed [name], [name] ([id number]) [id number] Page 2 of 12 Post Procedure Diagnosis Same as Pre-procedure Electronic Signature(s) Signed: 08/21/2023 3:20:58 PM By: [name] Guess MD [name] Signed: 08/21/2023 4:07:14 PM By: Brenton Grills Entered By: Brenton Grills on 08/21/2023 15:05:59 -------------------------------------------------------------------------------- HPI Details Patient Name: Date of Service: [name], [name] 08/21/2023 2:30 PM Medical Record Number: [id number] Patient Account Number: [id number] Date of Birth/Sex: Treating RN: [birth year] (77 y.o. F) Primary Care Provider: Eustaquio Boyden Other Clinician: Referring Provider: Treating Provider/Extender: Priscille Loveless in Treatment: 50 History of Present Illness HPI Description: ADMISSION This is a 77 year old woman with a history of chronic venous insufficiency status post saphenous vein ablations in 2010 and 2016. 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 She presents today with an open venous ulcer on her left lower leg. It has been present for a little over 2 weeks. She saw her primary care provider who apparently swabbed the wound and grew out Pseudomonas. She just completed a course of ciprofloxacin for this. ABI was 0.91. She reports that she has had previous issues with ulcers in this same location, stemming back to a punch biopsy taken by a dermatologist many years ago. She has had several skin substitutes applied to the  area that have ultimately resulted in healing on prior occasions. She has 2 small ulcers on her left medial lower leg. There is slough accumulation in both of them. The more anterior of the 2 is quite small and has some soft slough on the surface, underneath which there is good granulation tissue. The more medial wound also has slough accumulation, but the underlying surface is fairly fibrotic and gritty. This is consistent with her provided history of multiple ulcers in the same location. 08/03/2022:

## 2023-08-22 ENCOUNTER — Ambulatory Visit (INDEPENDENT_AMBULATORY_CARE_PROVIDER_SITE_OTHER): Payer: Medicare Other | Admitting: Podiatry

## 2023-08-22 ENCOUNTER — Encounter: Payer: Self-pay | Admitting: Podiatry

## 2023-08-22 DIAGNOSIS — M79674 Pain in right toe(s): Secondary | ICD-10-CM | POA: Diagnosis not present

## 2023-08-22 DIAGNOSIS — B351 Tinea unguium: Secondary | ICD-10-CM

## 2023-08-22 DIAGNOSIS — M79675 Pain in left toe(s): Secondary | ICD-10-CM

## 2023-08-28 ENCOUNTER — Encounter (HOSPITAL_BASED_OUTPATIENT_CLINIC_OR_DEPARTMENT_OTHER): Payer: Medicare Other | Admitting: General Surgery

## 2023-08-28 DIAGNOSIS — I10 Essential (primary) hypertension: Secondary | ICD-10-CM | POA: Diagnosis not present

## 2023-08-28 DIAGNOSIS — I871 Compression of vein: Secondary | ICD-10-CM | POA: Diagnosis not present

## 2023-08-28 DIAGNOSIS — I83893 Varicose veins of bilateral lower extremities with other complications: Secondary | ICD-10-CM | POA: Diagnosis not present

## 2023-08-28 DIAGNOSIS — L97822 Non-pressure chronic ulcer of other part of left lower leg with fat layer exposed: Secondary | ICD-10-CM | POA: Diagnosis not present

## 2023-08-28 DIAGNOSIS — I872 Venous insufficiency (chronic) (peripheral): Secondary | ICD-10-CM | POA: Diagnosis not present

## 2023-08-28 DIAGNOSIS — L97222 Non-pressure chronic ulcer of left calf with fat layer exposed: Secondary | ICD-10-CM | POA: Diagnosis not present

## 2023-08-28 DIAGNOSIS — Z86718 Personal history of other venous thrombosis and embolism: Secondary | ICD-10-CM | POA: Diagnosis not present

## 2023-08-28 NOTE — Progress Notes (Signed)
[name] ([id number]) [id number] Page 1 of 8 Visit Report for 08/28/2023 Arrival Information Details Patient Name: Date of Service: [name], [name] 08/28/2023 2:30 PM Medical Record Number: [id number] Patient Account Number: [id number] Date of Birth/Sex: Treating RN: 1946/08/04 (77 y.o. F) Primary Care Kendrew Paci: Eustaquio Boyden Other Clinician: Referring Gaither Biehn: Treating Layloni Fahrner/Extender: Priscille Loveless in Treatment: 56 Visit Information History Since Last Visit Added or deleted any medications: No Patient Arrived: Ambulatory Any new allergies or adverse reactions: No Arrival Time: 14:58 Had a fall or experienced change in No Accompanied By: self activities of daily living that may affect Transfer Assistance: None risk of falls: Patient Identification Verified: Yes Signs or symptoms of abuse/neglect since last visito No Secondary Verification Process Completed: Yes Hospitalized since last visit: No Patient Requires Transmission-Based Precautions: No Implantable device outside of the clinic excluding No Patient Has Alerts: No cellular tissue based products placed in the center since last visit: Pain Present Now: No Electronic Signature(s) Signed: 08/28/2023 3:00:29 PM By: Dayton Scrape Entered By: Dayton Scrape on 08/28/2023 11:59:26 -------------------------------------------------------------------------------- Compression Therapy Details Patient Name: Date of Service: [name], [name] 08/28/2023 2:30 PM Medical Record Number: [id number] Patient Account Number: [id number] Date of Birth/Sex: Treating RN: [birth year] (77 y.o. [name] Primary Care Trampus Mcquerry: Eustaquio Boyden Other Clinician: Referring Cidney Kirkwood: Treating Chea Malan/Extender: Priscille Loveless in Treatment: 56 Compression Therapy Performed for Wound Assessment: Wound #2 Left,Posterior Lower Leg Performed By: Clinician  Samuella Bruin, RN Compression Type: Double Layer Post Procedure Diagnosis Same as Pre-procedure Electronic Signature(s) Signed: 08/28/2023 4:15:04 PM By: Samuella Bruin Entered By: Samuella Bruin on 08/28/2023 12:51:22 Compression Therapy Details -------------------------------------------------------------------------------- [name] ([id number]) [id number] Page 2 of 8 Patient Name: Date of Service: [name], [name] 08/28/2023 2:30 PM Medical Record Number: [id number] Patient Account Number: [id number] Date of Birth/Sex: Treating RN: 08-05-1946 (76 y.o. [name] Primary Care Hartwell Vandiver: Eustaquio Boyden Other Clinician: Referring Kristeena Meineke: Treating Veldon Wager/Extender: Priscille Loveless in Treatment: 56 Compression Therapy Performed for Wound Assessment: NonWound Condition Lymphedema - Right Leg Performed By: Clinician Samuella Bruin, RN Compression Type: Double Layer Post Procedure Diagnosis Same as Pre-procedure Electronic Signature(s) Signed: 08/28/2023 4:15:04 PM By: Gelene Mink By: Samuella Bruin on 08/28/2023 12:51:33 -------------------------------------------------------------------------------- Encounter Discharge Information Details Patient Name: Date of Service: [name], [name] 08/28/2023 2:30 PM Medical Record Number: [id number] Patient Account Number: [id number] Date of Birth/Sex: Treating RN: 22-Dec-1945 (77 y.o. [name] Primary Care Teion Ballin: Eustaquio Boyden Other Clinician: Referring Jorryn Hershberger: Treating [name] Gracia/Extender: Priscille Loveless in Treatment: 83 Encounter Discharge Information Items Post Procedure Vitals Discharge Condition: Stable Temperature (F): 98.1 Ambulatory Status: Ambulatory Pulse (bpm): 78 Discharge Destination: Home Respiratory Rate (breaths/min): 18 Transportation: Private Auto Blood Pressure  (mmHg): 165/84 Accompanied By: self Schedule Follow-up Appointment: Yes Clinical Summary of Care: Patient Declined Electronic Signature(s) Signed: 08/28/2023 4:15:04 PM By: Samuella Bruin Entered By: Samuella Bruin on 08/28/2023 13:05:43 -------------------------------------------------------------------------------- Lower Extremity Assessment Details Patient Name: Date of Service: [name], [name] 08/28/2023 2:30 PM Medical Record Number: [id number] Patient Account Number: [id number] Date of Birth/Sex: Treating RN: [birth year] (77 y.o. [name] Primary Care Yanelly Cantrelle: Eustaquio Boyden Other Clinician: Referring Erikka Follmer: Treating Bryn Saline/Extender: Katharina Caper Weeks in Treatment: 56 Edema Assessment Assessed: [Left: No] [Right: No] Edema: [Left: Ye] [Right: s] Calf Left: Right: Point of Measurement: 31 cm From Medial Instep 47 cm [name] ([id number]) [id number] Page 3 of 8 Ankle Left: Right: Point of Measurement: 9 cm From  Medial Instep 25 cm Vascular Assessment Pulses: Dorsalis Pedis Palpable: [Left:Yes] Extremity colors, hair growth, and conditions: Extremity Color: [Left:Normal] Hair Growth on Extremity: [Left:Yes] Temperature of Extremity: [Left:Warm] Capillary Refill: [Left:< 3 seconds] Dependent Rubor: [Left:No No] Electronic Signature(s) Signed: 08/28/2023 4:15:04 PM By: Samuella Bruin Entered By: Samuella Bruin on 08/28/2023 12:28:15 -------------------------------------------------------------------------------- Multi Wound Chart Details Patient Name: Date of Service: [name] 08/28/2023 2:30 PM Medical Record Number: [id number] Patient Account Number: [id number] Date of Birth/Sex: Treating RN: [birth year] (77 y.o. F) Primary Care Nneoma Harral: Eustaquio Boyden Other Clinician: Referring Jeananne Bedwell: Treating Caisley Baxendale/Extender: Priscille Loveless in  Treatment: 56 Vital Signs Height(in): 63 Pulse(bpm): 78 Weight(lbs): 200 Blood Pressure(mmHg): 165/84 Body Mass Index(BMI): 35.4 Temperature(F): 98.1 Respiratory Rate(breaths/min): 18 [2:Photos:] [N/A:N/A] Left, Posterior Lower Leg N/A N/A Wound Location: Blister N/A N/A Wounding Event: Venous Leg Ulcer N/A N/A Primary Etiology: Cataracts, Asthma, Hypertension, N/A N/A Comorbid History: Neuropathy, Received Chemotherapy, Received Radiation 07/06/2022 N/A N/A Date Acquired: 45 N/A N/A Weeks of Treatment: Open N/A N/A Wound Status: No N/A N/A Wound Recurrence: 4.1x2x0.2 N/A N/A Measurements L x W x D (cm) 6.44 N/A N/A A (cm) : rea 1.288 N/A N/A Volume (cm) : 6.30% N/A N/A % Reduction in Area: -87.50% N/A N/A % Reduction in Volume: Full Thickness Without Exposed N/A N/A Classification: Support Structures Medium N/A N/A Exudate Amount: Serosanguineous N/A N/A Exudate TypeSHAYLIN, [name] ([id number]) [id number] Page 4 of 8 red, brown N/A N/A Exudate Color: Distinct, outline attached N/A N/A Wound Margin: Small (1-33%) N/A N/A Granulation Amount: Red N/A N/A Granulation Quality: Large (67-100%) N/A N/A Necrotic Amount: Eschar, Adherent Slough N/A N/A Necrotic Tissue: Fat Layer (Subcutaneous Tissue): Yes N/A N/A Exposed Structures: Fascia: No Tendon: No Muscle: No Joint: No Bone: No Small (1-33%) N/A N/A Epithelialization: Debridement - Selective/Open Wound N/A N/A Debridement: Pre-procedure Verification/Time Out 15:44 N/A N/A Taken: Lidocaine 4% Topical Solution N/A N/A Pain Control: Slough N/A N/A Tissue Debrided: Non-Viable Tissue N/A N/A Level: 6.44 N/A N/A Debridement A (sq cm): rea Curette N/A N/A Instrument: Minimum N/A N/A Bleeding: Pressure N/A N/A Hemostasis A chieved: Procedure was tolerated well N/A N/A Debridement Treatment Response: 4.1x2x0.2 N/A N/A Post Debridement Measurements L x W x D  (cm) 1.288 N/A N/A Post Debridement Volume: (cm) Scarring: Yes N/A N/A Periwound Skin Texture: Maceration: No N/A N/A Periwound Skin Moisture: Dry/Scaly: No Rubor: Yes N/A N/A Periwound Skin Color: No Abnormality N/A N/A Temperature: Yes N/A N/A Tenderness on Palpation: Compression Therapy N/A N/A Procedures Performed: Debridement Treatment Notes Electronic Signature(s) Signed: 08/28/2023 3:51:32 PM By: Duanne Guess MD FACS Entered By: Duanne Guess on 08/28/2023 12:51:32 -------------------------------------------------------------------------------- Multi-Disciplinary Care Plan Details Patient Name: Date of Service: [name], [name] 08/28/2023 2:30 PM Medical Record Number: [id number] Patient Account Number: [id number] Date of Birth/Sex: Treating RN: 09-Oct-1946 (77 y.o. [name] Primary Care Asako Saliba: Eustaquio Boyden Other Clinician: Referring Adedamola Seto: Treating Pallie Swigert/Extender: Priscille Loveless in Treatment: 49 Multidisciplinary Care Plan reviewed with physician Active Inactive Venous Leg Ulcer Nursing Diagnoses: Knowledge deficit related to disease process and management Potential for venous Insuffiency (use before diagnosis confirmed) Goals: Patient will maintain optimal edema control Date Initiated: 02/07/2023 Target Resolution Date: 09/06/2023 Goal Status: Active Interventions: Assess peripheral edema status every visit. Compression as ordered NAVIYAH, SCHEAR ([id number]) [phone].pdf Page 5 of 8 Treatment Activities: Therapeutic compression applied : 02/07/2023 Notes: Wound/Skin Impairment Nursing Diagnoses: Impaired tissue integrity Goals: Ulcer/skin breakdown will have a volume reduction of 30% by week 4 Date  Initiated: 08/03/2022 Date Inactivated: 09/10/2022 Target Resolution Date: 08/31/2022 Unmet Reason: uncontrolled edema, Goal Status: Unmet acute infection Ulcer/skin breakdown  will have a volume reduction of 50% by week 8 Date Initiated: 09/10/2022 Target Resolution Date: 09/06/2023 Goal Status: Active Interventions: Assess ulceration(s) every visit Treatment Activities: Skin care regimen initiated : 08/03/2022 Notes: Keystone ordered. 10/30 RX Levo started. 09/10/22 Electronic Signature(s) Signed: 08/28/2023 4:15:04 PM By: Samuella Bruin Entered By: Samuella Bruin on 08/28/2023 12:29:09 -------------------------------------------------------------------------------- Pain Assessment Details Patient Name: Date of Service: [name], [name] 08/28/2023 2:30 PM Medical Record Number: [id number] Patient Account Number: [id number] Date of Birth/Sex: Treating RN: [birth year] (77 y.o. F) Primary Care Alyxandria Wentz: Eustaquio Boyden Other Clinician: Referring Janique Hoefer: Treating Keimon Basaldua/Extender: Priscille Loveless in Treatment: 56 Active Problems Location of Pain Severity and Description of Pain Patient Has Paino No Site Locations Pain Management and Medication Current Pain Management: KORRYN, MONTANARI ([id number]) [phone].pdf Page 6 of 8 Electronic Signature(s) Signed: 08/28/2023 3:00:29 PM By: Dayton Scrape Entered By: Dayton Scrape on 08/28/2023 11:59:54 -------------------------------------------------------------------------------- Patient/Caregiver Education Details Patient Name: Date of Service: [name], [name] 10/23/2024andnbsp2:30 PM Medical Record Number: [id number] Patient Account Number: [id number] Date of Birth/Gender: Treating RN: [birth year] (77 y.o. [name] Primary Care Physician: Eustaquio Boyden Other Clinician: Referring Physician: Treating Physician/Extender: Priscille Loveless in Treatment: 87 Education Assessment Education Provided To: Patient Education Topics Provided Wound/Skin Impairment: Methods: Explain/Verbal Responses: Reinforcements needed,  State content correctly Electronic Signature(s) Signed: 08/28/2023 4:15:04 PM By: Samuella Bruin Entered By: Samuella Bruin on 08/28/2023 12:29:19 -------------------------------------------------------------------------------- Wound Assessment Details Patient Name: Date of Service: [name], [name] 08/28/2023 2:30 PM Medical Record Number: [id number] Patient Account Number: [id number] Date of Birth/Sex: Treating RN: 1946-10-31 (77 y.o. F) Primary Care Yandriel Boening: Eustaquio Boyden Other Clinician: Referring Ameira Alessandrini: Treating Kendle Turbin/Extender: Katharina Caper Weeks in Treatment: 56 Wound Status Wound Number: 2 Primary Venous Leg Ulcer Etiology: Wound Location: Left, Posterior Lower Leg Wound Open Wounding Event: Blister Status: Date Acquired: 07/06/2022 Comorbid Cataracts, Asthma, Hypertension, Neuropathy, Received Weeks Of Treatment: 56 History: Chemotherapy, Received Radiation Clustered Wound: No Photos [name], [name] ([id number]) [id number] Page 7 of 8 Wound Measurements Length: (cm) 4.1 Width: (cm) 2 Depth: (cm) 0.2 Area: (cm) 6.44 Volume: (cm) 1.288 % Reduction in Area: 6.3% % Reduction in Volume: -87.5% Epithelialization: Small (1-33%) Wound Description Classification: Full Thickness Without Exposed Support Structures Wound Margin: Distinct, outline attached Exudate Amount: Medium Exudate Type: Serosanguineous Exudate Color: red, brown Foul Odor After Cleansing: No Slough/Fibrino Yes Wound Bed Granulation Amount: Small (1-33%) Exposed Structure Granulation Quality: Red Fascia Exposed: No Necrotic Amount: Large (67-100%) Fat Layer (Subcutaneous Tissue) Exposed: Yes Necrotic Quality: Eschar, Adherent Slough Tendon Exposed: No Muscle Exposed: No Joint Exposed: No Bone Exposed: No Periwound Skin Texture Texture Color No Abnormalities Noted: Yes No Abnormalities Noted: No Rubor: Yes Moisture No  Abnormalities Noted: Yes Temperature / Pain Temperature: No Abnormality Tenderness on Palpation: Yes Treatment Notes Wound #2 (Lower Leg) Wound Laterality: Left, Posterior Cleanser Soap and Water Discharge Instruction: May shower and wash wound with dial antibacterial soap and water prior to dressing change. Peri-Wound Care Ketoconazole Cream 2% Discharge Instruction: Apply Ketoconazole as directed Triamcinolone 15 (g) Discharge Instruction: Use triamcinolone 15 (g) as directed Zinc Oxide Ointment 30g tube Discharge Instruction: Apply Zinc Oxide to periwound with each dressing change Sween Lotion (Moisturizing lotion) Discharge Instruction: Apply moisturizing lotion as directed Topical Gentamicin Discharge Instruction: As directed by physician Mupirocin Ointment Discharge Instruction: Apply Mupirocin (Bactroban) as instructed Skintegrity Hydrogel  Initiated: 08/03/2022 Date Inactivated: 09/10/2022 Target Resolution Date: 08/31/2022 Unmet Reason: uncontrolled edema, Goal Status: Unmet acute infection Ulcer/skin breakdown  will have a volume reduction of 50% by week 8 Date Initiated: 09/10/2022 Target Resolution Date: 09/06/2023 Goal Status: Active Interventions: Assess ulceration(s) every visit Treatment Activities: Skin care regimen initiated : 08/03/2022 Notes: Keystone ordered. 10/30 RX Levo started. 09/10/22 Electronic Signature(s) Signed: 08/28/2023 4:15:04 PM By: Samuella Bruin Entered By: Samuella Bruin on 08/28/2023 12:29:09 -------------------------------------------------------------------------------- Pain Assessment Details Patient Name: Date of Service: [name], [name] 08/28/2023 2:30 PM Medical Record Number: [id number] Patient Account Number: [id number] Date of Birth/Sex: Treating RN: [birth year] (77 y.o. F) Primary Care Alyxandria Wentz: Eustaquio Boyden Other Clinician: Referring Janique Hoefer: Treating Keimon Basaldua/Extender: Priscille Loveless in Treatment: 56 Active Problems Location of Pain Severity and Description of Pain Patient Has Paino No Site Locations Pain Management and Medication Current Pain Management: KORRYN, MONTANARI ([id number]) [phone].pdf Page 6 of 8 Electronic Signature(s) Signed: 08/28/2023 3:00:29 PM By: Dayton Scrape Entered By: Dayton Scrape on 08/28/2023 11:59:54 -------------------------------------------------------------------------------- Patient/Caregiver Education Details Patient Name: Date of Service: [name], [name] 10/23/2024andnbsp2:30 PM Medical Record Number: [id number] Patient Account Number: [id number] Date of Birth/Gender: Treating RN: [birth year] (77 y.o. [name] Primary Care Physician: Eustaquio Boyden Other Clinician: Referring Physician: Treating Physician/Extender: Priscille Loveless in Treatment: 87 Education Assessment Education Provided To: Patient Education Topics Provided Wound/Skin Impairment: Methods: Explain/Verbal Responses: Reinforcements needed,  State content correctly Electronic Signature(s) Signed: 08/28/2023 4:15:04 PM By: Samuella Bruin Entered By: Samuella Bruin on 08/28/2023 12:29:19 -------------------------------------------------------------------------------- Wound Assessment Details Patient Name: Date of Service: [name], [name] 08/28/2023 2:30 PM Medical Record Number: [id number] Patient Account Number: [id number] Date of Birth/Sex: Treating RN: 1946-10-31 (77 y.o. F) Primary Care Yandriel Boening: Eustaquio Boyden Other Clinician: Referring Ameira Alessandrini: Treating Kendle Turbin/Extender: Katharina Caper Weeks in Treatment: 56 Wound Status Wound Number: 2 Primary Venous Leg Ulcer Etiology: Wound Location: Left, Posterior Lower Leg Wound Open Wounding Event: Blister Status: Date Acquired: 07/06/2022 Comorbid Cataracts, Asthma, Hypertension, Neuropathy, Received Weeks Of Treatment: 56 History: Chemotherapy, Received Radiation Clustered Wound: No Photos [name], [name] ([id number]) [id number] Page 7 of 8 Wound Measurements Length: (cm) 4.1 Width: (cm) 2 Depth: (cm) 0.2 Area: (cm) 6.44 Volume: (cm) 1.288 % Reduction in Area: 6.3% % Reduction in Volume: -87.5% Epithelialization: Small (1-33%) Wound Description Classification: Full Thickness Without Exposed Support Structures Wound Margin: Distinct, outline attached Exudate Amount: Medium Exudate Type: Serosanguineous Exudate Color: red, brown Foul Odor After Cleansing: No Slough/Fibrino Yes Wound Bed Granulation Amount: Small (1-33%) Exposed Structure Granulation Quality: Red Fascia Exposed: No Necrotic Amount: Large (67-100%) Fat Layer (Subcutaneous Tissue) Exposed: Yes Necrotic Quality: Eschar, Adherent Slough Tendon Exposed: No Muscle Exposed: No Joint Exposed: No Bone Exposed: No Periwound Skin Texture Texture Color No Abnormalities Noted: Yes No Abnormalities Noted: No Rubor: Yes Moisture No  Abnormalities Noted: Yes Temperature / Pain Temperature: No Abnormality Tenderness on Palpation: Yes Treatment Notes Wound #2 (Lower Leg) Wound Laterality: Left, Posterior Cleanser Soap and Water Discharge Instruction: May shower and wash wound with dial antibacterial soap and water prior to dressing change. Peri-Wound Care Ketoconazole Cream 2% Discharge Instruction: Apply Ketoconazole as directed Triamcinolone 15 (g) Discharge Instruction: Use triamcinolone 15 (g) as directed Zinc Oxide Ointment 30g tube Discharge Instruction: Apply Zinc Oxide to periwound with each dressing change Sween Lotion (Moisturizing lotion) Discharge Instruction: Apply moisturizing lotion as directed Topical Gentamicin Discharge Instruction: As directed by physician Mupirocin Ointment Discharge Instruction: Apply Mupirocin (Bactroban) as instructed Skintegrity Hydrogel

## 2023-08-28 NOTE — Progress Notes (Signed)
Prim Dressing: Promogran Prisma Matrix, 4.34 (sq in) (silver collagen) 1 x Per Week/30 Days ary Discharge Instructions: Moisten collagen with saline or hydrogel Secondary Dressing: Optifoam Non-Adhesive Dressing, 4x4 in 1 x Per Week/30 Days Discharge Instructions: Apply over primary dressing as directed. Secondary Dressing: Zetuvit Plus 4x8 in 1 x Per  Week/30 Days Discharge Instructions: Apply over primary dressing as directed. Com pression Wrap: CoFlex Calamine Unna Boot 4 x 6 (in/yd) 1 x Per Week/30 Days Discharge Instructions: Apply Coflex [employer]amine [employer] as directed. 08/28/2023: Both open areas of the wound are smaller today. There is slough on the surface, but it is quite soft. Moisture balance is good. I used a curette to debride slough from the wound surfaces. We will continue topical gentamicin and mupirocin with Prisma silver collagen moistened with hydrogel. We are using an Optifoam cover to maintain moisture balance. Continue bilateral calamine-based Coflex. Follow-up in 1 week. Electronic Signature(s) Signed: 08/28/2023 3:54:51 PM By: Duanne Guess MD FACS Entered By: Duanne Guess on 08/28/2023 12:54:51 [name] ([id number]) [id number] Page 11 of 13 -------------------------------------------------------------------------------- HxROS Details Patient Name: Date of Service: [name], [name] 08/28/2023 2:30 PM Medical Record Number: [id number] Patient Account Number: [id number] Date of Birth/Sex: Treating RN: October 18, 1946 (77 y.o. F) Primary Care Provider: Eustaquio Boyden Other Clinician: Referring Provider: Treating Provider/Extender: Priscille Loveless in Treatment: 56 Information Obtained From Patient Eyes Medical History: Positive for: Cataracts - L eye Ear/Nose/Mouth/Throat Medical History: Negative for: Chronic sinus problems/congestion; Middle ear problems Hematologic/Lymphatic Medical History: Negative for: Anemia; Human Immunodeficiency Virus; Lymphedema Respiratory Medical History: Positive for: Asthma Past Medical History Notes: OSA on CPAP Cardiovascular Medical History: Positive for: Hypertension Past Medical History Notes: Cronic venous insufficiency Varicose veins of both lower extremities 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 Gastrointestinal Medical History: Past Medical History Notes: liver cyst Endocrine Medical History: Negative for: Type I Diabetes; Type II Diabetes Musculoskeletal Medical History: Past Medical History Notes: arthritis Neurologic Medical History: Positive for: Neuropathy - Feet and finger Negative for: Seizure Disorder Past Medical History Notes: restless leg syndrome Oncologic Medical History: Positive for: Received Chemotherapy - 2012; Received Radiation - 2012 [name], [name] ([id number]) [id number] Page 12 of 13 HBO Extended History Items Eyes: Cataracts Immunizations Pneumococcal Vaccine: Received Pneumococcal Vaccination: Yes Received Pneumococcal Vaccination On or After 60th Birthday: Yes Implantable Devices No devices added Hospitalization / Surgery History Type of Hospitalization/Surgery Lower extremity venography 2020 Intravascular ultrasound peripheral vascular intervention melanoma 2011 Family and Social History Cancer: Yes - Mother,Paternal Grandparents; Diabetes: Yes - Siblings; Heart Disease: Yes - Father; Hypertension: Yes - Father; Former smoker - quit in [employer]) Signed: 08/28/2023 4:38:34 PM By: Duanne Guess MD FACS Entered By: Duanne Guess on 08/28/2023 12:53:20 -------------------------------------------------------------------------------- SuperBill Details Patient Name: Date of Service: [name], [name] 08/28/2023 Medical Record Number: [id number] Patient Account Number: [id number] Date of Birth/Sex: Treating RN: 1945-12-16 (77 y.o. F) Primary Care Provider: Eustaquio Boyden Other Clinician: Referring Provider: Treating Provider/Extender: Priscille Loveless in Treatment: 56 Diagnosis Coding ICD-10 Codes Code Description [phone] Non-pressure chronic ulcer of other part of left lower leg with fat layer exposed I83.893 Varicose veins of bilateral lower  extremities with other complications I87.2 Venous insufficiency (chronic) (peripheral) G62.9 Polyneuropathy, unspecified R60.0 Localized edema Facility Procedures : CPT4 Code: [id number] Description: 97597 - DEBRIDE WOUND 1ST 20 SQ CM OR < ICD-10 Diagnosis Description L97.822 Non-pressure chronic ulcer of other part of left lower leg with fat layer expose Modifier: d Quantity: 1 Physician Procedures : CPT4 Code  is a little bit of slough overlying granulation tissue. [name], [name] ([id number]) [id number] Page 4 of 13 07/24/2023: The band of epithelium between the 2 open areas has gotten wider. The more proximal area has more epithelialization around the perimeter. Both have slough on the surface and remain fairly fibrotic. Moisture balance remains good. 08/07/2023: Both open areas are little bit smaller today. The band of epithelium between the 2 continues  to enlarge. There is slough and eschar on both surfaces. 08/14/2023: No significant change to the wound dimensions. The surface is rather dry and fibrotic again. 08/21/2023: The wound is smaller and the skin bridge between the 2 open areas has gotten wider. There is slough accumulation on the surfaces and the moisture balance is improved. 08/28/2023: Both open areas of the wound are smaller today. There is slough on the surface, but it is quite soft. Moisture balance is good. Electronic Signature(s) Signed: 08/28/2023 3:53:13 PM By: Duanne Guess MD FACS Entered By: Duanne Guess on 08/28/2023 12:53:13 -------------------------------------------------------------------------------- Physical Exam Details Patient Name: Date of Service: [name], [name] 08/28/2023 2:30 PM Medical Record Number: [id number] Patient Account Number: [id number] Date of Birth/Sex: Treating RN: Dec 18, 1945 (77 y.o. F) Primary Care Provider: Eustaquio Boyden Other Clinician: Referring Provider: Treating Provider/Extender: Priscille Loveless in Treatment: 56 Constitutional Hypertensive, asymptomatic. . . . no acute distress. Respiratory Normal work of breathing on room air.. Notes 08/28/2023: Both open areas of the wound are smaller today. There is slough on the surface, but it is quite soft. Moisture balance is good. Electronic Signature(s) Signed: 08/28/2023 3:53:43 PM By: Duanne Guess MD FACS Entered By: Duanne Guess on 08/28/2023 12:53:43 -------------------------------------------------------------------------------- Physician Orders Details Patient Name: Date of Service: EDDYE, WIESENFELD 08/28/2023 2:30 PM Medical Record Number: [id number] Patient Account Number: [id number] Date of Birth/Sex: Treating RN: 1946/03/08 (77 y.o. [name] Primary Care Provider: Eustaquio Boyden Other Clinician: Referring Provider: Treating Provider/Extender: Priscille Loveless in Treatment: 34 The following information was scribed by: Samuella Bruin The information was scribed for: Duanne Guess Verbal / Phone Orders: No Diagnosis Coding ICD-10 Coding Code Description [phone] Non-pressure chronic ulcer of other part of left lower leg with fat layer exposed I83.893 Varicose veins of bilateral lower extremities with other complications I87.2 Venous insufficiency (chronic) (peripheral) ALVENA, RIANO ([id number]) [id number] Page 5 of 13 G62.9 Polyneuropathy, unspecified R60.0 Localized edema Follow-up Appointments ppointment in 1 week. - Dr. Lady Gary Room 3 Return A Anesthetic Wound #2 Left,Posterior Lower Leg (In clinic) Topical Lidocaine 4% applied to wound bed Bathing/ Shower/ Hygiene May shower with protection but do not get wound dressing(s) wet. Protect dressing(s) with water repellant cover (for example, large plastic bag) or a cast cover and may then take shower. Edema Control - Lymphedema / SCD / Other Left Lower Extremity Lymphedema Pumps. Use Lymphedema pumps on leg(s) 2-3 times a day for 45-60 minutes. If wearing any wraps or hose, do not remove them. Continue exercising as instructed. - Use x1 per day Elevate legs to the level of the heart or above for 30 minutes daily and/or when sitting for 3-4 times a day throughout the day. Avoid standing for long periods of time. Exercise regularly Additional Orders / Instructions Other: - Recommend Nail Care Wound Treatment Wound #2 - Lower Leg Wound Laterality: Left, Posterior Cleanser: Soap and Water 1 x Per Week/30 Days Discharge Instructions: May shower and wash wound with dial antibacterial soap and water prior to dressing change. Peri-Wound Care: Ketoconazole Cream 2%  1 x Per Week/30 Days Discharge Instructions: Apply Ketoconazole as directed Peri-Wound Care: Triamcinolone 15 (g) 1 x Per Week/30 Days Discharge Instructions: Use  triamcinolone 15 (g) as directed Peri-Wound Care: Zinc Oxide Ointment 30g tube 1 x Per Week/30 Days Discharge Instructions: Apply Zinc Oxide to periwound with each dressing change Peri-Wound Care: Sween Lotion (Moisturizing lotion) 1 x Per Week/30 Days Discharge Instructions: Apply moisturizing lotion as directed Topical: Gentamicin 1 x Per Week/30 Days Discharge Instructions: As directed by physician Topical: Mupirocin Ointment 1 x Per Week/30 Days Discharge Instructions: Apply Mupirocin (Bactroban) as instructed Topical: Skintegrity Hydrogel 4 (oz) 1 x Per Week/30 Days Discharge Instructions: Apply hydrogel as directed Prim Dressing: Promogran Prisma Matrix, 4.34 (sq in) (silver collagen) 1 x Per Week/30 Days ary Discharge Instructions: Moisten collagen with saline or hydrogel Secondary Dressing: Optifoam Non-Adhesive Dressing, 4x4 in 1 x Per Week/30 Days Discharge Instructions: Apply over primary dressing as directed. Secondary Dressing: Zetuvit Plus 4x8 in 1 x Per Week/30 Days Discharge Instructions: Apply over primary dressing as directed. Compression Wrap: CoFlex Calamine Unna Boot 4 x 6 (in/yd) 1 x Per Week/30 Days Discharge Instructions: Apply Coflex Calamine [employer] as directed. Patient Medications llergies: sulfa antibiotics, Voltaren, doxycycline, Neoprene, adhesive, latex A Notifications Medication Indication Start End 08/28/2023 lidocaine DOSE topical 4 % cream - cream topical Electronic Signature(s) Signed: 08/28/2023 4:38:34 PM By: Duanne Guess MD Shirline Frees ([id number]) [id number] Page 6 of 13 Entered By: Duanne Guess on 08/28/2023 12:54:04 -------------------------------------------------------------------------------- Problem List Details Patient Name: Date of Service: NEREYDA, WESTERMEYER 08/28/2023 2:30 PM Medical Record Number: [id number] Patient Account Number: [id number] Date of Birth/Sex: Treating RN: 11/12/1945 (77  y.o. F) Primary Care Provider: Eustaquio Boyden Other Clinician: Referring Provider: Treating Provider/Extender: Priscille Loveless in Treatment: 80 Active Problems ICD-10 Encounter Code Description Active Date MDM Diagnosis [phone] Non-pressure chronic ulcer of other part of left lower leg with fat layer exposed 07/27/2022 No Yes I83.893 Varicose veins of bilateral lower extremities with other complications 07/27/2022 No Yes I87.2 Venous insufficiency (chronic) (peripheral) 07/27/2022 No Yes G62.9 Polyneuropathy, unspecified 07/27/2022 No Yes R60.0 Localized edema 07/27/2022 No Yes Inactive Problems ICD-10 Code Description Active Date Inactive Date L03.115 Cellulitis of right lower limb 10/22/2022 10/22/2022 L97.818 Non-pressure chronic ulcer of other part of right lower leg with other specified severity 03/01/2023 10/22/2022 Resolved Problems Electronic Signature(s) Signed: 08/28/2023 3:50:38 PM By: Duanne Guess MD FACS Entered By: Duanne Guess on 08/28/2023 12:50:38 -------------------------------------------------------------------------------- Progress Note Details Patient Name: Date of Service: [name] 08/28/2023 2:30 PM Medical Record Number: [id number] Patient Account Number: [id number] [name], [name] ([id number]) [id number] Page 7 of 13 Date of Birth/Sex: Treating RN: [birth year] (77 y.o. F) Primary Care Provider: Other Clinician: Eustaquio Boyden Referring Provider: Treating Provider/Extender: Priscille Loveless in Treatment: 56 Subjective Chief Complaint Information obtained from Patient Patient presents for treatment of an open ulcer due to venous insufficiency History of Present Illness (HPI) ADMISSION This is a 77 year old woman with a history of chronic venous insufficiency status post saphenous vein ablations in 2010 and 2016. 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 She presents today with an open venous ulcer on her left lower leg. It has been present for a little over 2 weeks. She saw her primary care provider who apparently swabbed the wound and grew out Pseudomonas. She just completed a course of ciprofloxacin for this. ABI was 0.91. She reports that she has had previous issues with ulcers  is a little bit of slough overlying granulation tissue. [name], [name] ([id number]) [id number] Page 4 of 13 07/24/2023: The band of epithelium between the 2 open areas has gotten wider. The more proximal area has more epithelialization around the perimeter. Both have slough on the surface and remain fairly fibrotic. Moisture balance remains good. 08/07/2023: Both open areas are little bit smaller today. The band of epithelium between the 2 continues  to enlarge. There is slough and eschar on both surfaces. 08/14/2023: No significant change to the wound dimensions. The surface is rather dry and fibrotic again. 08/21/2023: The wound is smaller and the skin bridge between the 2 open areas has gotten wider. There is slough accumulation on the surfaces and the moisture balance is improved. 08/28/2023: Both open areas of the wound are smaller today. There is slough on the surface, but it is quite soft. Moisture balance is good. Electronic Signature(s) Signed: 08/28/2023 3:53:13 PM By: Duanne Guess MD FACS Entered By: Duanne Guess on 08/28/2023 12:53:13 -------------------------------------------------------------------------------- Physical Exam Details Patient Name: Date of Service: [name], [name] 08/28/2023 2:30 PM Medical Record Number: [id number] Patient Account Number: [id number] Date of Birth/Sex: Treating RN: Dec 18, 1945 (77 y.o. F) Primary Care Provider: Eustaquio Boyden Other Clinician: Referring Provider: Treating Provider/Extender: Priscille Loveless in Treatment: 56 Constitutional Hypertensive, asymptomatic. . . . no acute distress. Respiratory Normal work of breathing on room air.. Notes 08/28/2023: Both open areas of the wound are smaller today. There is slough on the surface, but it is quite soft. Moisture balance is good. Electronic Signature(s) Signed: 08/28/2023 3:53:43 PM By: Duanne Guess MD FACS Entered By: Duanne Guess on 08/28/2023 12:53:43 -------------------------------------------------------------------------------- Physician Orders Details Patient Name: Date of Service: EDDYE, WIESENFELD 08/28/2023 2:30 PM Medical Record Number: [id number] Patient Account Number: [id number] Date of Birth/Sex: Treating RN: 1946/03/08 (77 y.o. [name] Primary Care Provider: Eustaquio Boyden Other Clinician: Referring Provider: Treating Provider/Extender: Priscille Loveless in Treatment: 34 The following information was scribed by: Samuella Bruin The information was scribed for: Duanne Guess Verbal / Phone Orders: No Diagnosis Coding ICD-10 Coding Code Description [phone] Non-pressure chronic ulcer of other part of left lower leg with fat layer exposed I83.893 Varicose veins of bilateral lower extremities with other complications I87.2 Venous insufficiency (chronic) (peripheral) ALVENA, RIANO ([id number]) [id number] Page 5 of 13 G62.9 Polyneuropathy, unspecified R60.0 Localized edema Follow-up Appointments ppointment in 1 week. - Dr. Lady Gary Room 3 Return A Anesthetic Wound #2 Left,Posterior Lower Leg (In clinic) Topical Lidocaine 4% applied to wound bed Bathing/ Shower/ Hygiene May shower with protection but do not get wound dressing(s) wet. Protect dressing(s) with water repellant cover (for example, large plastic bag) or a cast cover and may then take shower. Edema Control - Lymphedema / SCD / Other Left Lower Extremity Lymphedema Pumps. Use Lymphedema pumps on leg(s) 2-3 times a day for 45-60 minutes. If wearing any wraps or hose, do not remove them. Continue exercising as instructed. - Use x1 per day Elevate legs to the level of the heart or above for 30 minutes daily and/or when sitting for 3-4 times a day throughout the day. Avoid standing for long periods of time. Exercise regularly Additional Orders / Instructions Other: - Recommend Nail Care Wound Treatment Wound #2 - Lower Leg Wound Laterality: Left, Posterior Cleanser: Soap and Water 1 x Per Week/30 Days Discharge Instructions: May shower and wash wound with dial antibacterial soap and water prior to dressing change. Peri-Wound Care: Ketoconazole Cream 2%  LAZARA, BIENVENUE ([id number]) [id number] Page 1 of 13 Visit Report for 08/28/2023 Chief Complaint Document Details Patient Name: Date of Service: [name], [name] 08/28/2023 2:30 PM Medical Record Number: [id number] Patient Account Number: [id number] Date of Birth/Sex: Treating RN: Mar 13, 1946 (77 y.o. F) Primary Care Provider: Eustaquio Boyden Other Clinician: Referring Provider: Treating Provider/Extender: Priscille Loveless in Treatment: 56 Information Obtained from: Patient Chief Complaint Patient presents for treatment of an open ulcer due to venous insufficiency Electronic Signature(s) Signed: 08/28/2023 3:52:35 PM By: Duanne Guess MD FACS Entered By: Duanne Guess on 08/28/2023 12:52:35 -------------------------------------------------------------------------------- Debridement Details Patient Name: Date of Service: [name], [name] 08/28/2023 2:30 PM Medical Record Number: [id number] Patient Account Number: [id number] Date of Birth/Sex: Treating RN: [birth year] (76 y.o. [name] Primary Care Provider: Eustaquio Boyden Other Clinician: Referring Provider: Treating Provider/Extender: Priscille Loveless in Treatment: 56 Debridement Performed for Assessment: Wound #2 Left,Posterior Lower Leg Performed By: Physician Duanne Guess, MD The following information was scribed by: Samuella Bruin The information was scribed for: Duanne Guess Debridement Type: Debridement Severity of Tissue Pre Debridement: Fat layer exposed Level of Consciousness (Pre-procedure): Awake and Alert Pre-procedure Verification/Time Out Yes - 15:44 Taken: Start Time: 15:44 Pain Control: Lidocaine 4% T opical Solution Percent of Wound Bed Debrided: 100% T Area Debrided (cm): otal 6.44 Tissue and other material debrided: Non-Viable, Slough, Slough Level: Non-Viable Tissue Debridement Description:  Selective/Open Wound Instrument: Curette Bleeding: Minimum Hemostasis Achieved: Pressure Response to Treatment: Procedure was tolerated well Level of Consciousness (Post- Awake and Alert procedure): Post Debridement Measurements of Total Wound Length: (cm) 4.1 Width: (cm) 2 Depth: (cm) 0.2 Volume: (cm) 1.288 Character of Wound/Ulcer Post Debridement: Improved Severity of Tissue Post Debridement: Fat layer exposed [name], [name] ([id number]) [id number] Page 2 of 13 Post Procedure Diagnosis Same as Pre-procedure Electronic Signature(s) Signed: 08/28/2023 4:15:04 PM By: Samuella Bruin Signed: 08/28/2023 4:38:34 PM By: Duanne Guess MD FACS Entered By: Samuella Bruin on 08/28/2023 12:50:38 -------------------------------------------------------------------------------- HPI Details Patient Name: Date of Service: [name], [name] 08/28/2023 2:30 PM Medical Record Number: [id number] Patient Account Number: [id number] Date of Birth/Sex: Treating RN: [birth year] (77 y.o. F) Primary Care Provider: Eustaquio Boyden Other Clinician: Referring Provider: Treating Provider/Extender: Priscille Loveless in Treatment: 56 History of Present Illness HPI Description: ADMISSION This is a 77 year old woman with a history of chronic venous insufficiency status post saphenous vein ablations in 2010 and 2016. 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 She presents today with an open venous ulcer on her left lower leg. It has been present for a little over 2 weeks. She saw her primary care provider who apparently swabbed the wound and grew out Pseudomonas. She just completed a course of ciprofloxacin for this. ABI was 0.91. She reports that she has had previous issues with ulcers in this same location, stemming back to a punch biopsy taken by a dermatologist many years ago. She has had several skin substitutes applied to  the area that have ultimately resulted in healing on prior occasions. She has 2 small ulcers on her left medial lower leg. There is slough accumulation in both of them. The more anterior of the 2 is quite small and has some soft slough on the surface, underneath which there is good granulation tissue. The more medial wound also has slough accumulation, but the underlying surface is fairly fibrotic and gritty. This is consistent with her provided history of multiple ulcers in the same location. 08/03/2022:  is a little bit of slough overlying granulation tissue. [name], [name] ([id number]) [id number] Page 4 of 13 07/24/2023: The band of epithelium between the 2 open areas has gotten wider. The more proximal area has more epithelialization around the perimeter. Both have slough on the surface and remain fairly fibrotic. Moisture balance remains good. 08/07/2023: Both open areas are little bit smaller today. The band of epithelium between the 2 continues  to enlarge. There is slough and eschar on both surfaces. 08/14/2023: No significant change to the wound dimensions. The surface is rather dry and fibrotic again. 08/21/2023: The wound is smaller and the skin bridge between the 2 open areas has gotten wider. There is slough accumulation on the surfaces and the moisture balance is improved. 08/28/2023: Both open areas of the wound are smaller today. There is slough on the surface, but it is quite soft. Moisture balance is good. Electronic Signature(s) Signed: 08/28/2023 3:53:13 PM By: Duanne Guess MD FACS Entered By: Duanne Guess on 08/28/2023 12:53:13 -------------------------------------------------------------------------------- Physical Exam Details Patient Name: Date of Service: [name], [name] 08/28/2023 2:30 PM Medical Record Number: [id number] Patient Account Number: [id number] Date of Birth/Sex: Treating RN: Dec 18, 1945 (77 y.o. F) Primary Care Provider: Eustaquio Boyden Other Clinician: Referring Provider: Treating Provider/Extender: Priscille Loveless in Treatment: 56 Constitutional Hypertensive, asymptomatic. . . . no acute distress. Respiratory Normal work of breathing on room air.. Notes 08/28/2023: Both open areas of the wound are smaller today. There is slough on the surface, but it is quite soft. Moisture balance is good. Electronic Signature(s) Signed: 08/28/2023 3:53:43 PM By: Duanne Guess MD FACS Entered By: Duanne Guess on 08/28/2023 12:53:43 -------------------------------------------------------------------------------- Physician Orders Details Patient Name: Date of Service: EDDYE, WIESENFELD 08/28/2023 2:30 PM Medical Record Number: [id number] Patient Account Number: [id number] Date of Birth/Sex: Treating RN: 1946/03/08 (77 y.o. [name] Primary Care Provider: Eustaquio Boyden Other Clinician: Referring Provider: Treating Provider/Extender: Priscille Loveless in Treatment: 34 The following information was scribed by: Samuella Bruin The information was scribed for: Duanne Guess Verbal / Phone Orders: No Diagnosis Coding ICD-10 Coding Code Description [phone] Non-pressure chronic ulcer of other part of left lower leg with fat layer exposed I83.893 Varicose veins of bilateral lower extremities with other complications I87.2 Venous insufficiency (chronic) (peripheral) ALVENA, RIANO ([id number]) [id number] Page 5 of 13 G62.9 Polyneuropathy, unspecified R60.0 Localized edema Follow-up Appointments ppointment in 1 week. - Dr. Lady Gary Room 3 Return A Anesthetic Wound #2 Left,Posterior Lower Leg (In clinic) Topical Lidocaine 4% applied to wound bed Bathing/ Shower/ Hygiene May shower with protection but do not get wound dressing(s) wet. Protect dressing(s) with water repellant cover (for example, large plastic bag) or a cast cover and may then take shower. Edema Control - Lymphedema / SCD / Other Left Lower Extremity Lymphedema Pumps. Use Lymphedema pumps on leg(s) 2-3 times a day for 45-60 minutes. If wearing any wraps or hose, do not remove them. Continue exercising as instructed. - Use x1 per day Elevate legs to the level of the heart or above for 30 minutes daily and/or when sitting for 3-4 times a day throughout the day. Avoid standing for long periods of time. Exercise regularly Additional Orders / Instructions Other: - Recommend Nail Care Wound Treatment Wound #2 - Lower Leg Wound Laterality: Left, Posterior Cleanser: Soap and Water 1 x Per Week/30 Days Discharge Instructions: May shower and wash wound with dial antibacterial soap and water prior to dressing change. Peri-Wound Care: Ketoconazole Cream 2%  is a little bit of slough overlying granulation tissue. [name], [name] ([id number]) [id number] Page 4 of 13 07/24/2023: The band of epithelium between the 2 open areas has gotten wider. The more proximal area has more epithelialization around the perimeter. Both have slough on the surface and remain fairly fibrotic. Moisture balance remains good. 08/07/2023: Both open areas are little bit smaller today. The band of epithelium between the 2 continues  to enlarge. There is slough and eschar on both surfaces. 08/14/2023: No significant change to the wound dimensions. The surface is rather dry and fibrotic again. 08/21/2023: The wound is smaller and the skin bridge between the 2 open areas has gotten wider. There is slough accumulation on the surfaces and the moisture balance is improved. 08/28/2023: Both open areas of the wound are smaller today. There is slough on the surface, but it is quite soft. Moisture balance is good. Electronic Signature(s) Signed: 08/28/2023 3:53:13 PM By: Duanne Guess MD FACS Entered By: Duanne Guess on 08/28/2023 12:53:13 -------------------------------------------------------------------------------- Physical Exam Details Patient Name: Date of Service: [name], [name] 08/28/2023 2:30 PM Medical Record Number: [id number] Patient Account Number: [id number] Date of Birth/Sex: Treating RN: Dec 18, 1945 (77 y.o. F) Primary Care Provider: Eustaquio Boyden Other Clinician: Referring Provider: Treating Provider/Extender: Priscille Loveless in Treatment: 56 Constitutional Hypertensive, asymptomatic. . . . no acute distress. Respiratory Normal work of breathing on room air.. Notes 08/28/2023: Both open areas of the wound are smaller today. There is slough on the surface, but it is quite soft. Moisture balance is good. Electronic Signature(s) Signed: 08/28/2023 3:53:43 PM By: Duanne Guess MD FACS Entered By: Duanne Guess on 08/28/2023 12:53:43 -------------------------------------------------------------------------------- Physician Orders Details Patient Name: Date of Service: EDDYE, WIESENFELD 08/28/2023 2:30 PM Medical Record Number: [id number] Patient Account Number: [id number] Date of Birth/Sex: Treating RN: 1946/03/08 (77 y.o. [name] Primary Care Provider: Eustaquio Boyden Other Clinician: Referring Provider: Treating Provider/Extender: Priscille Loveless in Treatment: 34 The following information was scribed by: Samuella Bruin The information was scribed for: Duanne Guess Verbal / Phone Orders: No Diagnosis Coding ICD-10 Coding Code Description [phone] Non-pressure chronic ulcer of other part of left lower leg with fat layer exposed I83.893 Varicose veins of bilateral lower extremities with other complications I87.2 Venous insufficiency (chronic) (peripheral) ALVENA, RIANO ([id number]) [id number] Page 5 of 13 G62.9 Polyneuropathy, unspecified R60.0 Localized edema Follow-up Appointments ppointment in 1 week. - Dr. Lady Gary Room 3 Return A Anesthetic Wound #2 Left,Posterior Lower Leg (In clinic) Topical Lidocaine 4% applied to wound bed Bathing/ Shower/ Hygiene May shower with protection but do not get wound dressing(s) wet. Protect dressing(s) with water repellant cover (for example, large plastic bag) or a cast cover and may then take shower. Edema Control - Lymphedema / SCD / Other Left Lower Extremity Lymphedema Pumps. Use Lymphedema pumps on leg(s) 2-3 times a day for 45-60 minutes. If wearing any wraps or hose, do not remove them. Continue exercising as instructed. - Use x1 per day Elevate legs to the level of the heart or above for 30 minutes daily and/or when sitting for 3-4 times a day throughout the day. Avoid standing for long periods of time. Exercise regularly Additional Orders / Instructions Other: - Recommend Nail Care Wound Treatment Wound #2 - Lower Leg Wound Laterality: Left, Posterior Cleanser: Soap and Water 1 x Per Week/30 Days Discharge Instructions: May shower and wash wound with dial antibacterial soap and water prior to dressing change. Peri-Wound Care: Ketoconazole Cream 2%  LAZARA, BIENVENUE ([id number]) [id number] Page 1 of 13 Visit Report for 08/28/2023 Chief Complaint Document Details Patient Name: Date of Service: [name], [name] 08/28/2023 2:30 PM Medical Record Number: [id number] Patient Account Number: [id number] Date of Birth/Sex: Treating RN: Mar 13, 1946 (77 y.o. F) Primary Care Provider: Eustaquio Boyden Other Clinician: Referring Provider: Treating Provider/Extender: Priscille Loveless in Treatment: 56 Information Obtained from: Patient Chief Complaint Patient presents for treatment of an open ulcer due to venous insufficiency Electronic Signature(s) Signed: 08/28/2023 3:52:35 PM By: Duanne Guess MD FACS Entered By: Duanne Guess on 08/28/2023 12:52:35 -------------------------------------------------------------------------------- Debridement Details Patient Name: Date of Service: [name], [name] 08/28/2023 2:30 PM Medical Record Number: [id number] Patient Account Number: [id number] Date of Birth/Sex: Treating RN: [birth year] (76 y.o. [name] Primary Care Provider: Eustaquio Boyden Other Clinician: Referring Provider: Treating Provider/Extender: Priscille Loveless in Treatment: 56 Debridement Performed for Assessment: Wound #2 Left,Posterior Lower Leg Performed By: Physician Duanne Guess, MD The following information was scribed by: Samuella Bruin The information was scribed for: Duanne Guess Debridement Type: Debridement Severity of Tissue Pre Debridement: Fat layer exposed Level of Consciousness (Pre-procedure): Awake and Alert Pre-procedure Verification/Time Out Yes - 15:44 Taken: Start Time: 15:44 Pain Control: Lidocaine 4% T opical Solution Percent of Wound Bed Debrided: 100% T Area Debrided (cm): otal 6.44 Tissue and other material debrided: Non-Viable, Slough, Slough Level: Non-Viable Tissue Debridement Description:  Selective/Open Wound Instrument: Curette Bleeding: Minimum Hemostasis Achieved: Pressure Response to Treatment: Procedure was tolerated well Level of Consciousness (Post- Awake and Alert procedure): Post Debridement Measurements of Total Wound Length: (cm) 4.1 Width: (cm) 2 Depth: (cm) 0.2 Volume: (cm) 1.288 Character of Wound/Ulcer Post Debridement: Improved Severity of Tissue Post Debridement: Fat layer exposed [name], [name] ([id number]) [id number] Page 2 of 13 Post Procedure Diagnosis Same as Pre-procedure Electronic Signature(s) Signed: 08/28/2023 4:15:04 PM By: Samuella Bruin Signed: 08/28/2023 4:38:34 PM By: Duanne Guess MD FACS Entered By: Samuella Bruin on 08/28/2023 12:50:38 -------------------------------------------------------------------------------- HPI Details Patient Name: Date of Service: [name], [name] 08/28/2023 2:30 PM Medical Record Number: [id number] Patient Account Number: [id number] Date of Birth/Sex: Treating RN: [birth year] (77 y.o. F) Primary Care Provider: Eustaquio Boyden Other Clinician: Referring Provider: Treating Provider/Extender: Priscille Loveless in Treatment: 56 History of Present Illness HPI Description: ADMISSION This is a 77 year old woman with a history of chronic venous insufficiency status post saphenous vein ablations in 2010 and 2016. 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 She presents today with an open venous ulcer on her left lower leg. It has been present for a little over 2 weeks. She saw her primary care provider who apparently swabbed the wound and grew out Pseudomonas. She just completed a course of ciprofloxacin for this. ABI was 0.91. She reports that she has had previous issues with ulcers in this same location, stemming back to a punch biopsy taken by a dermatologist many years ago. She has had several skin substitutes applied to  the area that have ultimately resulted in healing on prior occasions. She has 2 small ulcers on her left medial lower leg. There is slough accumulation in both of them. The more anterior of the 2 is quite small and has some soft slough on the surface, underneath which there is good granulation tissue. The more medial wound also has slough accumulation, but the underlying surface is fairly fibrotic and gritty. This is consistent with her provided history of multiple ulcers in the same location. 08/03/2022:  Prim Dressing: Promogran Prisma Matrix, 4.34 (sq in) (silver collagen) 1 x Per Week/30 Days ary Discharge Instructions: Moisten collagen with saline or hydrogel Secondary Dressing: Optifoam Non-Adhesive Dressing, 4x4 in 1 x Per Week/30 Days Discharge Instructions: Apply over primary dressing as directed. Secondary Dressing: Zetuvit Plus 4x8 in 1 x Per  Week/30 Days Discharge Instructions: Apply over primary dressing as directed. Com pression Wrap: CoFlex Calamine Unna Boot 4 x 6 (in/yd) 1 x Per Week/30 Days Discharge Instructions: Apply Coflex Calamine [employer] as directed. 08/28/2023: Both open areas of the wound are smaller today. There is slough on the surface, but it is quite soft. Moisture balance is good. I used a curette to debride slough from the wound surfaces. We will continue topical gentamicin and mupirocin with Prisma silver collagen moistened with hydrogel. We are using an Optifoam cover to maintain moisture balance. Continue bilateral calamine-based Coflex. Follow-up in 1 week. Electronic Signature(s) Signed: 08/28/2023 3:54:51 PM By: Duanne Guess MD FACS Entered By: Duanne Guess on 08/28/2023 12:54:51 [name] ([id number]) [id number] Page 11 of 13 -------------------------------------------------------------------------------- HxROS Details Patient Name: Date of Service: [name], [name] 08/28/2023 2:30 PM Medical Record Number: [id number] Patient Account Number: [id number] Date of Birth/Sex: Treating RN: October 18, 1946 (77 y.o. F) Primary Care Provider: Eustaquio Boyden Other Clinician: Referring Provider: Treating Provider/Extender: Priscille Loveless in Treatment: 56 Information Obtained From Patient Eyes Medical History: Positive for: Cataracts - L eye Ear/Nose/Mouth/Throat Medical History: Negative for: Chronic sinus problems/congestion; Middle ear problems Hematologic/Lymphatic Medical History: Negative for: Anemia; Human Immunodeficiency Virus; Lymphedema Respiratory Medical History: Positive for: Asthma Past Medical History Notes: OSA on CPAP Cardiovascular Medical History: Positive for: Hypertension Past Medical History Notes: Cronic venous insufficiency Varicose veins of both lower extremities 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 Gastrointestinal Medical History: Past Medical History Notes: liver cyst Endocrine Medical History: Negative for: Type I Diabetes; Type II Diabetes Musculoskeletal Medical History: Past Medical History Notes: arthritis Neurologic Medical History: Positive for: Neuropathy - Feet and finger Negative for: Seizure Disorder Past Medical History Notes: restless leg syndrome Oncologic Medical History: Positive for: Received Chemotherapy - 2012; Received Radiation - 2012 [name], [name] ([id number]) [id number] Page 12 of 13 HBO Extended History Items Eyes: Cataracts Immunizations Pneumococcal Vaccine: Received Pneumococcal Vaccination: Yes Received Pneumococcal Vaccination On or After 60th Birthday: Yes Implantable Devices No devices added Hospitalization / Surgery History Type of Hospitalization/Surgery Lower extremity venography 2020 Intravascular ultrasound peripheral vascular intervention melanoma 2011 Family and Social History Cancer: Yes - Mother,Paternal Grandparents; Diabetes: Yes - Siblings; Heart Disease: Yes - Father; Hypertension: Yes - Father; Former smoker - quit in [employer]) Signed: 08/28/2023 4:38:34 PM By: Duanne Guess MD FACS Entered By: Duanne Guess on 08/28/2023 12:53:20 -------------------------------------------------------------------------------- SuperBill Details Patient Name: Date of Service: [name], [name] 08/28/2023 Medical Record Number: [id number] Patient Account Number: [id number] Date of Birth/Sex: Treating RN: 1945-12-16 (77 y.o. F) Primary Care Provider: Eustaquio Boyden Other Clinician: Referring Provider: Treating Provider/Extender: Priscille Loveless in Treatment: 56 Diagnosis Coding ICD-10 Codes Code Description [phone] Non-pressure chronic ulcer of other part of left lower leg with fat layer exposed I83.893 Varicose veins of bilateral lower  extremities with other complications I87.2 Venous insufficiency (chronic) (peripheral) G62.9 Polyneuropathy, unspecified R60.0 Localized edema Facility Procedures : CPT4 Code: [id number] Description: 97597 - DEBRIDE WOUND 1ST 20 SQ CM OR < ICD-10 Diagnosis Description L97.822 Non-pressure chronic ulcer of other part of left lower leg with fat layer expose Modifier: d Quantity: 1 Physician Procedures : CPT4 Code  1 x Per Week/30 Days Discharge Instructions: Apply Ketoconazole as directed Peri-Wound Care: Triamcinolone 15 (g) 1 x Per Week/30 Days Discharge Instructions: Use  triamcinolone 15 (g) as directed Peri-Wound Care: Zinc Oxide Ointment 30g tube 1 x Per Week/30 Days Discharge Instructions: Apply Zinc Oxide to periwound with each dressing change Peri-Wound Care: Sween Lotion (Moisturizing lotion) 1 x Per Week/30 Days Discharge Instructions: Apply moisturizing lotion as directed Topical: Gentamicin 1 x Per Week/30 Days Discharge Instructions: As directed by physician Topical: Mupirocin Ointment 1 x Per Week/30 Days Discharge Instructions: Apply Mupirocin (Bactroban) as instructed Topical: Skintegrity Hydrogel 4 (oz) 1 x Per Week/30 Days Discharge Instructions: Apply hydrogel as directed Prim Dressing: Promogran Prisma Matrix, 4.34 (sq in) (silver collagen) 1 x Per Week/30 Days ary Discharge Instructions: Moisten collagen with saline or hydrogel Secondary Dressing: Optifoam Non-Adhesive Dressing, 4x4 in 1 x Per Week/30 Days Discharge Instructions: Apply over primary dressing as directed. Secondary Dressing: Zetuvit Plus 4x8 in 1 x Per Week/30 Days Discharge Instructions: Apply over primary dressing as directed. Compression Wrap: CoFlex Calamine Unna Boot 4 x 6 (in/yd) 1 x Per Week/30 Days Discharge Instructions: Apply Coflex Calamine [employer] as directed. Patient Medications llergies: sulfa antibiotics, Voltaren, doxycycline, Neoprene, adhesive, latex A Notifications Medication Indication Start End 08/28/2023 lidocaine DOSE topical 4 % cream - cream topical Electronic Signature(s) Signed: 08/28/2023 4:38:34 PM By: Duanne Guess MD Shirline Frees ([id number]) [id number] Page 6 of 13 Entered By: Duanne Guess on 08/28/2023 12:54:04 -------------------------------------------------------------------------------- Problem List Details Patient Name: Date of Service: NEREYDA, WESTERMEYER 08/28/2023 2:30 PM Medical Record Number: [id number] Patient Account Number: [id number] Date of Birth/Sex: Treating RN: 11/12/1945 (77  y.o. F) Primary Care Provider: Eustaquio Boyden Other Clinician: Referring Provider: Treating Provider/Extender: Priscille Loveless in Treatment: 80 Active Problems ICD-10 Encounter Code Description Active Date MDM Diagnosis [phone] Non-pressure chronic ulcer of other part of left lower leg with fat layer exposed 07/27/2022 No Yes I83.893 Varicose veins of bilateral lower extremities with other complications 07/27/2022 No Yes I87.2 Venous insufficiency (chronic) (peripheral) 07/27/2022 No Yes G62.9 Polyneuropathy, unspecified 07/27/2022 No Yes R60.0 Localized edema 07/27/2022 No Yes Inactive Problems ICD-10 Code Description Active Date Inactive Date L03.115 Cellulitis of right lower limb 10/22/2022 10/22/2022 L97.818 Non-pressure chronic ulcer of other part of right lower leg with other specified severity 03/01/2023 10/22/2022 Resolved Problems Electronic Signature(s) Signed: 08/28/2023 3:50:38 PM By: Duanne Guess MD FACS Entered By: Duanne Guess on 08/28/2023 12:50:38 -------------------------------------------------------------------------------- Progress Note Details Patient Name: Date of Service: [name] 08/28/2023 2:30 PM Medical Record Number: [id number] Patient Account Number: [id number] [name], [name] ([id number]) [id number] Page 7 of 13 Date of Birth/Sex: Treating RN: [birth year] (77 y.o. F) Primary Care Provider: Other Clinician: Eustaquio Boyden Referring Provider: Treating Provider/Extender: Priscille Loveless in Treatment: 56 Subjective Chief Complaint Information obtained from Patient Patient presents for treatment of an open ulcer due to venous insufficiency History of Present Illness (HPI) ADMISSION This is a 77 year old woman with a history of chronic venous insufficiency status post saphenous vein ablations in 2010 and 2016. 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 She presents today with an open venous ulcer on her left lower leg. It has been present for a little over 2 weeks. She saw her primary care provider who apparently swabbed the wound and grew out Pseudomonas. She just completed a course of ciprofloxacin for this. ABI was 0.91. She reports that she has had previous issues with ulcers  LAZARA, BIENVENUE ([id number]) [id number] Page 1 of 13 Visit Report for 08/28/2023 Chief Complaint Document Details Patient Name: Date of Service: [name], [name] 08/28/2023 2:30 PM Medical Record Number: [id number] Patient Account Number: [id number] Date of Birth/Sex: Treating RN: Mar 13, 1946 (77 y.o. F) Primary Care Provider: Eustaquio Boyden Other Clinician: Referring Provider: Treating Provider/Extender: Priscille Loveless in Treatment: 56 Information Obtained from: Patient Chief Complaint Patient presents for treatment of an open ulcer due to venous insufficiency Electronic Signature(s) Signed: 08/28/2023 3:52:35 PM By: Duanne Guess MD FACS Entered By: Duanne Guess on 08/28/2023 12:52:35 -------------------------------------------------------------------------------- Debridement Details Patient Name: Date of Service: [name], [name] 08/28/2023 2:30 PM Medical Record Number: [id number] Patient Account Number: [id number] Date of Birth/Sex: Treating RN: [birth year] (76 y.o. [name] Primary Care Provider: Eustaquio Boyden Other Clinician: Referring Provider: Treating Provider/Extender: Priscille Loveless in Treatment: 56 Debridement Performed for Assessment: Wound #2 Left,Posterior Lower Leg Performed By: Physician Duanne Guess, MD The following information was scribed by: Samuella Bruin The information was scribed for: Duanne Guess Debridement Type: Debridement Severity of Tissue Pre Debridement: Fat layer exposed Level of Consciousness (Pre-procedure): Awake and Alert Pre-procedure Verification/Time Out Yes - 15:44 Taken: Start Time: 15:44 Pain Control: Lidocaine 4% T opical Solution Percent of Wound Bed Debrided: 100% T Area Debrided (cm): otal 6.44 Tissue and other material debrided: Non-Viable, Slough, Slough Level: Non-Viable Tissue Debridement Description:  Selective/Open Wound Instrument: Curette Bleeding: Minimum Hemostasis Achieved: Pressure Response to Treatment: Procedure was tolerated well Level of Consciousness (Post- Awake and Alert procedure): Post Debridement Measurements of Total Wound Length: (cm) 4.1 Width: (cm) 2 Depth: (cm) 0.2 Volume: (cm) 1.288 Character of Wound/Ulcer Post Debridement: Improved Severity of Tissue Post Debridement: Fat layer exposed [name], [name] ([id number]) [id number] Page 2 of 13 Post Procedure Diagnosis Same as Pre-procedure Electronic Signature(s) Signed: 08/28/2023 4:15:04 PM By: Samuella Bruin Signed: 08/28/2023 4:38:34 PM By: Duanne Guess MD FACS Entered By: Samuella Bruin on 08/28/2023 12:50:38 -------------------------------------------------------------------------------- HPI Details Patient Name: Date of Service: [name], [name] 08/28/2023 2:30 PM Medical Record Number: [id number] Patient Account Number: [id number] Date of Birth/Sex: Treating RN: [birth year] (77 y.o. F) Primary Care Provider: Eustaquio Boyden Other Clinician: Referring Provider: Treating Provider/Extender: Priscille Loveless in Treatment: 56 History of Present Illness HPI Description: ADMISSION This is a 77 year old woman with a history of chronic venous insufficiency status post saphenous vein ablations in 2010 and 2016. 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 She presents today with an open venous ulcer on her left lower leg. It has been present for a little over 2 weeks. She saw her primary care provider who apparently swabbed the wound and grew out Pseudomonas. She just completed a course of ciprofloxacin for this. ABI was 0.91. She reports that she has had previous issues with ulcers in this same location, stemming back to a punch biopsy taken by a dermatologist many years ago. She has had several skin substitutes applied to  the area that have ultimately resulted in healing on prior occasions. She has 2 small ulcers on her left medial lower leg. There is slough accumulation in both of them. The more anterior of the 2 is quite small and has some soft slough on the surface, underneath which there is good granulation tissue. The more medial wound also has slough accumulation, but the underlying surface is fairly fibrotic and gritty. This is consistent with her provided history of multiple ulcers in the same location. 08/03/2022:  1 x Per Week/30 Days Discharge Instructions: Apply Ketoconazole as directed Peri-Wound Care: Triamcinolone 15 (g) 1 x Per Week/30 Days Discharge Instructions: Use  triamcinolone 15 (g) as directed Peri-Wound Care: Zinc Oxide Ointment 30g tube 1 x Per Week/30 Days Discharge Instructions: Apply Zinc Oxide to periwound with each dressing change Peri-Wound Care: Sween Lotion (Moisturizing lotion) 1 x Per Week/30 Days Discharge Instructions: Apply moisturizing lotion as directed Topical: Gentamicin 1 x Per Week/30 Days Discharge Instructions: As directed by physician Topical: Mupirocin Ointment 1 x Per Week/30 Days Discharge Instructions: Apply Mupirocin (Bactroban) as instructed Topical: Skintegrity Hydrogel 4 (oz) 1 x Per Week/30 Days Discharge Instructions: Apply hydrogel as directed Prim Dressing: Promogran Prisma Matrix, 4.34 (sq in) (silver collagen) 1 x Per Week/30 Days ary Discharge Instructions: Moisten collagen with saline or hydrogel Secondary Dressing: Optifoam Non-Adhesive Dressing, 4x4 in 1 x Per Week/30 Days Discharge Instructions: Apply over primary dressing as directed. Secondary Dressing: Zetuvit Plus 4x8 in 1 x Per Week/30 Days Discharge Instructions: Apply over primary dressing as directed. Compression Wrap: CoFlex Calamine Unna Boot 4 x 6 (in/yd) 1 x Per Week/30 Days Discharge Instructions: Apply Coflex Calamine [employer] as directed. Patient Medications llergies: sulfa antibiotics, Voltaren, doxycycline, Neoprene, adhesive, latex A Notifications Medication Indication Start End 08/28/2023 lidocaine DOSE topical 4 % cream - cream topical Electronic Signature(s) Signed: 08/28/2023 4:38:34 PM By: Duanne Guess MD Shirline Frees ([id number]) [id number] Page 6 of 13 Entered By: Duanne Guess on 08/28/2023 12:54:04 -------------------------------------------------------------------------------- Problem List Details Patient Name: Date of Service: NEREYDA, WESTERMEYER 08/28/2023 2:30 PM Medical Record Number: [id number] Patient Account Number: [id number] Date of Birth/Sex: Treating RN: 11/12/1945 (77  y.o. F) Primary Care Provider: Eustaquio Boyden Other Clinician: Referring Provider: Treating Provider/Extender: Priscille Loveless in Treatment: 80 Active Problems ICD-10 Encounter Code Description Active Date MDM Diagnosis [phone] Non-pressure chronic ulcer of other part of left lower leg with fat layer exposed 07/27/2022 No Yes I83.893 Varicose veins of bilateral lower extremities with other complications 07/27/2022 No Yes I87.2 Venous insufficiency (chronic) (peripheral) 07/27/2022 No Yes G62.9 Polyneuropathy, unspecified 07/27/2022 No Yes R60.0 Localized edema 07/27/2022 No Yes Inactive Problems ICD-10 Code Description Active Date Inactive Date L03.115 Cellulitis of right lower limb 10/22/2022 10/22/2022 L97.818 Non-pressure chronic ulcer of other part of right lower leg with other specified severity 03/01/2023 10/22/2022 Resolved Problems Electronic Signature(s) Signed: 08/28/2023 3:50:38 PM By: Duanne Guess MD FACS Entered By: Duanne Guess on 08/28/2023 12:50:38 -------------------------------------------------------------------------------- Progress Note Details Patient Name: Date of Service: [name] 08/28/2023 2:30 PM Medical Record Number: [id number] Patient Account Number: [id number] [name], [name] ([id number]) [id number] Page 7 of 13 Date of Birth/Sex: Treating RN: [birth year] (77 y.o. F) Primary Care Provider: Other Clinician: Eustaquio Boyden Referring Provider: Treating Provider/Extender: Priscille Loveless in Treatment: 56 Subjective Chief Complaint Information obtained from Patient Patient presents for treatment of an open ulcer due to venous insufficiency History of Present Illness (HPI) ADMISSION This is a 77 year old woman with a history of chronic venous insufficiency status post saphenous vein ablations in 2010 and 2016. 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 She presents today with an open venous ulcer on her left lower leg. It has been present for a little over 2 weeks. She saw her primary care provider who apparently swabbed the wound and grew out Pseudomonas. She just completed a course of ciprofloxacin for this. ABI was 0.91. She reports that she has had previous issues with ulcers  Prim Dressing: Promogran Prisma Matrix, 4.34 (sq in) (silver collagen) 1 x Per Week/30 Days ary Discharge Instructions: Moisten collagen with saline or hydrogel Secondary Dressing: Optifoam Non-Adhesive Dressing, 4x4 in 1 x Per Week/30 Days Discharge Instructions: Apply over primary dressing as directed. Secondary Dressing: Zetuvit Plus 4x8 in 1 x Per  Week/30 Days Discharge Instructions: Apply over primary dressing as directed. Com pression Wrap: CoFlex Calamine Unna Boot 4 x 6 (in/yd) 1 x Per Week/30 Days Discharge Instructions: Apply Coflex Calamine [employer] as directed. 08/28/2023: Both open areas of the wound are smaller today. There is slough on the surface, but it is quite soft. Moisture balance is good. I used a curette to debride slough from the wound surfaces. We will continue topical gentamicin and mupirocin with Prisma silver collagen moistened with hydrogel. We are using an Optifoam cover to maintain moisture balance. Continue bilateral calamine-based Coflex. Follow-up in 1 week. Electronic Signature(s) Signed: 08/28/2023 3:54:51 PM By: Duanne Guess MD FACS Entered By: Duanne Guess on 08/28/2023 12:54:51 [name] ([id number]) [id number] Page 11 of 13 -------------------------------------------------------------------------------- HxROS Details Patient Name: Date of Service: [name], [name] 08/28/2023 2:30 PM Medical Record Number: [id number] Patient Account Number: [id number] Date of Birth/Sex: Treating RN: October 18, 1946 (77 y.o. F) Primary Care Provider: Eustaquio Boyden Other Clinician: Referring Provider: Treating Provider/Extender: Priscille Loveless in Treatment: 56 Information Obtained From Patient Eyes Medical History: Positive for: Cataracts - L eye Ear/Nose/Mouth/Throat Medical History: Negative for: Chronic sinus problems/congestion; Middle ear problems Hematologic/Lymphatic Medical History: Negative for: Anemia; Human Immunodeficiency Virus; Lymphedema Respiratory Medical History: Positive for: Asthma Past Medical History Notes: OSA on CPAP Cardiovascular Medical History: Positive for: Hypertension Past Medical History Notes: Cronic venous insufficiency Varicose veins of both lower extremities 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 Gastrointestinal Medical History: Past Medical History Notes: liver cyst Endocrine Medical History: Negative for: Type I Diabetes; Type II Diabetes Musculoskeletal Medical History: Past Medical History Notes: arthritis Neurologic Medical History: Positive for: Neuropathy - Feet and finger Negative for: Seizure Disorder Past Medical History Notes: restless leg syndrome Oncologic Medical History: Positive for: Received Chemotherapy - 2012; Received Radiation - 2012 [name], [name] ([id number]) [id number] Page 12 of 13 HBO Extended History Items Eyes: Cataracts Immunizations Pneumococcal Vaccine: Received Pneumococcal Vaccination: Yes Received Pneumococcal Vaccination On or After 60th Birthday: Yes Implantable Devices No devices added Hospitalization / Surgery History Type of Hospitalization/Surgery Lower extremity venography 2020 Intravascular ultrasound peripheral vascular intervention melanoma 2011 Family and Social History Cancer: Yes - Mother,Paternal Grandparents; Diabetes: Yes - Siblings; Heart Disease: Yes - Father; Hypertension: Yes - Father; Former smoker - quit in [employer]) Signed: 08/28/2023 4:38:34 PM By: Duanne Guess MD FACS Entered By: Duanne Guess on 08/28/2023 12:53:20 -------------------------------------------------------------------------------- SuperBill Details Patient Name: Date of Service: [name], [name] 08/28/2023 Medical Record Number: [id number] Patient Account Number: [id number] Date of Birth/Sex: Treating RN: 1945-12-16 (77 y.o. F) Primary Care Provider: Eustaquio Boyden Other Clinician: Referring Provider: Treating Provider/Extender: Priscille Loveless in Treatment: 56 Diagnosis Coding ICD-10 Codes Code Description [phone] Non-pressure chronic ulcer of other part of left lower leg with fat layer exposed I83.893 Varicose veins of bilateral lower  extremities with other complications I87.2 Venous insufficiency (chronic) (peripheral) G62.9 Polyneuropathy, unspecified R60.0 Localized edema Facility Procedures : CPT4 Code: [id number] Description: 97597 - DEBRIDE WOUND 1ST 20 SQ CM OR < ICD-10 Diagnosis Description L97.822 Non-pressure chronic ulcer of other part of left lower leg with fat layer expose Modifier: d Quantity: 1 Physician Procedures : CPT4 Code

## 2023-08-31 NOTE — Progress Notes (Signed)
Subjective:  Patient ID: [name], female    DOB: 19-Aug-1946,  MRN: [id number]  77 y.o. female presents to clinic with  painful elongated mycotic toenails 1-5 bilaterally which are tender when wearing enclosed shoe gear. Pain is relieved with periodic professional debridement.    New problem(s): None   PCP is Eustaquio Boyden, MD.  Allergies  Allergen Reactions   Sulfa Antibiotics Itching, Swelling and Shortness Of Breath    Facial swelling   Voltaren [Diclofenac Sodium] Shortness Of Breath   Latex Other (See Comments)    Blisters ONLY HAD REACTION TO TAPE  (??? ONLY ADHESIVE ALLERGY)   Tape Other (See Comments)    Caused blisters - must use paper tape - same reaction from NeoPrene   Doxycycline Other (See Comments)    Diaphoresis and dizziness   Gabapentin Other (See Comments)    confusion, sleep a lot, and swelling in her legs   Other     Neoprene   Wound Dressing Adhesive Rash    Review of Systems: Negative except as noted in the HPI.   Objective:  [name] is a pleasant 77 y.o. female in NAD.Marland Kitchen  Vascular Examination: Vascular status intact b/l with palpable pedal pulses. CFT immediate b/l. No edema. No pain with calf compression b/l. Skin temperature gradient WNL b/l. No cyanosis or clubbing noted b/l LE.  Neurological Examination: Protective sensation diminished with 10g monofilament b/l.  Dermatological Examination: Pedal skin with normal turgor, texture and tone b/l. Toenails 1-5 b/l thick, discolored, elongated with subungual debris and pain on dorsal palpation. No hyperkeratotic lesions noted b/l. Pincer nail deformity bilateral great toes. No erythema, no edema, no drainage, no fluctuance. Nail border hypertrophy minimal.  Sign(s) of infection: no clinical signs of infection noted on examination today..  Musculoskeletal Examination: Muscle strength 5/5 to b/l LE.  DJD Lisfranc joint b/l.  Radiographs: None  Last A1c:       No data to display          Assessment:   1. Pain due to onychomycosis of toenails of both feet    Plan:  -Consent given for treatment as described below: -Examined patient. -Patient to continue soft, supportive shoe gear daily. -Toenails 1-5 b/l were debrided in length and girth with sterile nail nippers and dremel without iatrogenic bleeding.  -Patient/POA to call should there be question/concern in the interim.  Return in about 3 months (around 11/22/2023).  Freddie Breech, DPM

## 2023-09-04 ENCOUNTER — Encounter (HOSPITAL_BASED_OUTPATIENT_CLINIC_OR_DEPARTMENT_OTHER): Payer: Medicare Other | Admitting: General Surgery

## 2023-09-04 DIAGNOSIS — L97222 Non-pressure chronic ulcer of left calf with fat layer exposed: Secondary | ICD-10-CM | POA: Diagnosis not present

## 2023-09-04 DIAGNOSIS — I872 Venous insufficiency (chronic) (peripheral): Secondary | ICD-10-CM | POA: Diagnosis not present

## 2023-09-04 DIAGNOSIS — L97822 Non-pressure chronic ulcer of other part of left lower leg with fat layer exposed: Secondary | ICD-10-CM | POA: Diagnosis not present

## 2023-09-04 DIAGNOSIS — Z86718 Personal history of other venous thrombosis and embolism: Secondary | ICD-10-CM | POA: Diagnosis not present

## 2023-09-04 DIAGNOSIS — I871 Compression of vein: Secondary | ICD-10-CM | POA: Diagnosis not present

## 2023-09-04 DIAGNOSIS — I83893 Varicose veins of bilateral lower extremities with other complications: Secondary | ICD-10-CM | POA: Diagnosis not present

## 2023-09-04 DIAGNOSIS — I10 Essential (primary) hypertension: Secondary | ICD-10-CM | POA: Diagnosis not present

## 2023-09-05 NOTE — Progress Notes (Signed)
[name], [name] ([id number]) [id number] Page 1 of 13 Visit Report for 09/04/2023 Chief Complaint Document Details Patient Name: Date of Service: [name] 09/04/2023 2:30 PM Medical Record Number: [id number] Patient Account Number: [id number] Date of Birth/Sex: Treating RN: August 13, 1946 (77 y.o. F) Primary Care Provider: Eustaquio Boyden Other Clinician: Referring Provider: Treating Provider/Extender: Priscille Loveless in Treatment: 47 Information Obtained from: Patient Chief Complaint Patient presents for treatment of an open ulcer due to venous insufficiency Electronic Signature(s) Signed: 09/04/2023 4:34:00 PM By: Duanne Guess MD FACS Entered By: Duanne Guess on 09/04/2023 16:34:00 -------------------------------------------------------------------------------- Debridement Details Patient Name: Date of Service: [name], [name] 09/04/2023 2:30 PM Medical Record Number: [id number] Patient Account Number: [id number] Date of Birth/Sex: Treating RN: 01-06-1946 (77 y.o. [name] Primary Care Provider: Eustaquio Boyden Other Clinician: Referring Provider: Treating Provider/Extender: Priscille Loveless in Treatment: 57 Debridement Performed for Assessment: Wound #2 Left,Posterior Lower Leg Performed By: Physician Duanne Guess, MD Debridement Type: Debridement Severity of Tissue Pre Debridement: Fat layer exposed Level of Consciousness (Pre-procedure): Awake and Alert Pre-procedure Verification/Time Out Yes - 15:05 Taken: Start Time: 15:05 Pain Control: Lidocaine 5% topical ointment Percent of Wound Bed Debrided: 100% T Area Debrided (cm): otal 4.52 Tissue and other material debrided: Non-Viable, Eschar, Slough, Slough Level: Non-Viable Tissue Debridement Description: Selective/Open Wound Instrument: Curette Bleeding: Minimum Hemostasis Achieved: Pressure Response to Treatment:  Procedure was tolerated well Level of Consciousness (Post- Awake and Alert procedure): Post Debridement Measurements of Total Wound Length: (cm) 3.6 Width: (cm) 1.6 Depth: (cm) 0.2 Volume: (cm) 0.905 Character of Wound/Ulcer Post Debridement: Improved Severity of Tissue Post Debridement: Fat layer exposed Post Procedure Diagnosis [name], [name] ([id number]) [id number] Page 2 of 13 Same as Pre-procedure Electronic Signature(s) Signed: 09/04/2023 5:03:07 PM By: Redmond Pulling RN, [name] Signed: 09/04/2023 5:50:32 PM By: Duanne Guess MD FACS Entered By: Redmond Pulling on 09/04/2023 15:07:57 -------------------------------------------------------------------------------- HPI Details Patient Name: Date of Service: [name], [name] 09/04/2023 2:30 PM Medical Record Number: [id number] Patient Account Number: [id number] Date of Birth/Sex: Treating RN: [birth year] (77 y.o. F) Primary Care Provider: Eustaquio Boyden Other Clinician: Referring Provider: Treating Provider/Extender: Priscille Loveless in Treatment: 31 History of Present Illness HPI Description: ADMISSION This is a 77 year old woman with a history of chronic venous insufficiency status post saphenous vein ablations in 2010 and 2016. 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 She presents today with an open venous ulcer on her left lower leg. It has been present for a little over 2 weeks. She saw her primary care provider who apparently swabbed the wound and grew out Pseudomonas. She just completed a course of ciprofloxacin for this. ABI was 0.91. She reports that she has had previous issues with ulcers in this same location, stemming back to a punch biopsy taken by a dermatologist many years ago. She has had several skin substitutes applied to the area that have ultimately resulted in healing on prior occasions. She has 2 small ulcers on her left medial  lower leg. There is slough accumulation in both of them. The more anterior of the 2 is quite small and has some soft slough on the surface, underneath which there is good granulation tissue. The more medial wound also has slough accumulation, but the underlying surface is fairly fibrotic and gritty. This is consistent with her provided history of multiple ulcers in the same location. 08/03/2022: The anterior wound is smaller today with just a little bit of slough  directed. Com pression Wrap: CoFlex Calamine Unna Boot 4 x 6 (in/yd) 1 x Per Week/30 Days Discharge Instructions: Apply Coflex Calamine [employer] as directed. 09/04/2023: The band of epithelium between the 2 open areas continues to widen. The wound  is very sensitive today, more so than usual. No concern for infection based upon visual inspection, however. There is some slough on the wound surface, which remains fairly fibrotic. I used a curette to debride the slough off of the wound surface. We will continue the topical gentamicin and mupirocin with Prisma silver collagen moistened with hydrogel and covering the wound with Optifoam to help maintain a moist environment. Continue calamine-based Unna boot compression. Follow-up in 1 week. Electronic Signature(s) Signed: 09/04/2023 4:42:35 PM By: Duanne Guess MD FACS Entered By: Duanne Guess on 09/04/2023 16:42:34 HxROS Details -------------------------------------------------------------------------------- [name] ([id number]) [id number] Page 11 of 13 Patient Name: Date of Service: [name], [name] 09/04/2023 2:30 PM Medical Record Number: [id number] Patient Account Number: [id number] Date of Birth/Sex: Treating RN: May 02, 1946 (77 y.o. F) Primary Care Provider: Eustaquio Boyden Other Clinician: Referring Provider: Treating Provider/Extender: Priscille Loveless in Treatment: 63 Information Obtained From Patient Eyes Medical History: Positive for: Cataracts - L eye Ear/Nose/Mouth/Throat Medical History: Negative for: Chronic sinus problems/congestion; Middle ear problems Hematologic/Lymphatic Medical History: Negative for: Anemia; Human Immunodeficiency Virus; Lymphedema Respiratory Medical History: Positive for: Asthma Past Medical History Notes: OSA on CPAP Cardiovascular Medical History: Positive for: Hypertension Past Medical History Notes: Cronic venous insufficiency 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 Gastrointestinal Medical History: Past Medical History Notes: liver cyst Endocrine Medical History: Negative for: Type I Diabetes; Type II Diabetes Musculoskeletal Medical  History: Past Medical History Notes: arthritis Neurologic Medical History: Positive for: Neuropathy - Feet and finger Negative for: Seizure Disorder Past Medical History Notes: restless leg syndrome Oncologic Medical History: Positive for: Received Chemotherapy - 2012; Received Radiation - 2012 HBO Extended History Items Eyes: Cataracts Immunizations Pneumococcal Vaccine: Received Pneumococcal Vaccination: TALYNN, CAYWOOD ([id number]) [id number] Page 12 of 13 Received Pneumococcal Vaccination On or After 60th Birthday: Yes Implantable Devices No devices added Hospitalization / Surgery History Type of Hospitalization/Surgery Lower extremity venography 2020 Intravascular ultrasound peripheral vascular intervention melanoma 2011 Family and Social History Cancer: Yes - Mother,Paternal Grandparents; Diabetes: Yes - Siblings; Heart Disease: Yes - Father; Hypertension: Yes - Father; Former smoker - quit in [employer]) Signed: 09/04/2023 5:50:32 PM By: Duanne Guess MD FACS Entered By: Duanne Guess on 09/04/2023 16:36:13 -------------------------------------------------------------------------------- SuperBill Details Patient Name: Date of Service: [name], [name] 09/04/2023 Medical Record Number: [id number] Patient Account Number: [id number] Date of Birth/Sex: Treating RN: [birth year] (77 y.o. F) Primary Care Provider: Eustaquio Boyden Other Clinician: Referring Provider: Treating Provider/Extender: Priscille Loveless in Treatment: 57 Diagnosis Coding ICD-10 Codes Code Description [phone] Non-pressure chronic ulcer of other part of left lower leg with fat layer exposed I83.893 Varicose veins of bilateral lower extremities with other complications I87.2 Venous insufficiency (chronic) (peripheral) G62.9 Polyneuropathy, unspecified R60.0 Localized edema Facility Procedures : CPT4 Code:  [id number] Description: 97597 - DEBRIDE WOUND 1ST 20 SQ CM OR < ICD-10 Diagnosis Description L97.822 Non-pressure chronic ulcer of other part of left lower leg with fat layer expose Modifier: d Quantity: 1 Physician Procedures : CPT4 Code Description Modifier [id number] 99214 - WC PHYS LEVEL 4 - EST PT ICD-10 Diagnosis Description L97.822 Non-pressure chronic ulcer of other part of left lower leg with fat layer exposed I83.893 Varicose veins of bilateral lower extremities  epithelium between the 2 open areas has gotten wider. The more proximal area has more epithelialization around the perimeter. Both have slough on the surface and remain fairly fibrotic. Moisture balance remains good. [name], [name] ([id number]) [id number] Page 4 of 13 08/07/2023: Both open areas are little bit smaller today. The band of epithelium between the 2 continues to enlarge. There is slough and eschar on both surfaces. 08/14/2023: No significant change to the wound dimensions. The surface  is rather dry and fibrotic again. 08/21/2023: The wound is smaller and the skin bridge between the 2 open areas has gotten wider. There is slough accumulation on the surfaces and the moisture balance is improved. 08/28/2023: Both open areas of the wound are smaller today. There is slough on the surface, but it is quite soft. Moisture balance is good. 09/04/2023: The band of epithelium between the 2 open areas continues to widen. The wound is very sensitive today, more so than usual. No concern for infection based upon visual inspection, however. There is some slough on the wound surface, which remains fairly fibrotic. Electronic Signature(s) Signed: 09/04/2023 4:34:52 PM By: Duanne Guess MD FACS Entered By: Duanne Guess on 09/04/2023 16:34:52 -------------------------------------------------------------------------------- Physical Exam Details Patient Name: Date of Service: BRITTANIA, BULLEY 09/04/2023 2:30 PM Medical Record Number: [id number] Patient Account Number: [id number] Date of Birth/Sex: Treating RN: 1946/06/10 (77 y.o. F) Primary Care Provider: Eustaquio Boyden Other Clinician: Referring Provider: Treating Provider/Extender: Priscille Loveless in Treatment: 48 Constitutional Hypertensive, asymptomatic. . . . no acute distress. Respiratory Normal work of breathing on room air.. Notes 09/04/2023: The band of epithelium between the 2 open areas continues to widen. The wound is very sensitive today, more so than usual. No concern for infection based upon visual inspection, however. There is some slough on the wound surface, which remains fairly fibrotic. Electronic Signature(s) Signed: 09/04/2023 4:37:24 PM By: Duanne Guess MD FACS Entered By: Duanne Guess on 09/04/2023 16:37:24 -------------------------------------------------------------------------------- Physician Orders Details Patient Name: Date of Service: [name], [name] 09/04/2023  2:30 PM Medical Record Number: [id number] Patient Account Number: [id number] Date of Birth/Sex: Treating RN: 09-05-1946 (77 y.o. [name] Primary Care Provider: Eustaquio Boyden Other Clinician: Referring Provider: Treating Provider/Extender: Priscille Loveless in Treatment: 19 Verbal / Phone Orders: No Diagnosis Coding ICD-10 Coding Code Description [phone] Non-pressure chronic ulcer of other part of left lower leg with fat layer exposed I83.893 Varicose veins of bilateral lower extremities with other complications I87.2 Venous insufficiency (chronic) (peripheral) G62.9 Polyneuropathy, unspecified [name], [name] ([id number]) [id number] Page 5 of 13 R60.0 Localized edema Follow-up Appointments ppointment in 1 week. - Dr. Lady Gary Room 3 Return A Anesthetic Wound #2 Left,Posterior Lower Leg (In clinic) Topical Lidocaine 4% applied to wound bed Bathing/ Shower/ Hygiene May shower with protection but do not get wound dressing(s) wet. Protect dressing(s) with water repellant cover (for example, large plastic bag) or a cast cover and may then take shower. Additional Orders / Instructions Other: - Recommend Nail Care Wound Treatment Wound #2 - Lower Leg Wound Laterality: Left, Posterior Cleanser: Soap and Water 1 x Per Week/30 Days Discharge Instructions: May shower and wash wound with dial antibacterial soap and water prior to dressing change. Peri-Wound Care: Ketoconazole Cream 2% 1 x Per Week/30 Days Discharge Instructions: Apply Ketoconazole as directed Peri-Wound Care: Triamcinolone 15 (g) 1 x Per Week/30 Days Discharge Instructions: Use triamcinolone 15 (g) as directed Peri-Wound Care: Zinc Oxide Ointment 30g tube 1 x Per Week/30 Days Discharge Instructions:  Apply Zinc Oxide to periwound with each dressing change Peri-Wound Care: Sween Lotion (Moisturizing lotion) 1 x Per Week/30 Days Discharge Instructions: Apply  moisturizing lotion as directed Topical: Gentamicin 1 x Per Week/30 Days Discharge Instructions: As directed by physician Topical: Mupirocin Ointment 1 x Per Week/30 Days Discharge Instructions: Apply Mupirocin (Bactroban) as instructed Topical: Skintegrity Hydrogel 4 (oz) 1 x Per Week/30 Days Discharge Instructions: Apply hydrogel as directed Prim Dressing: Promogran Prisma Matrix, 4.34 (sq in) (silver collagen) 1 x Per Week/30 Days ary Discharge Instructions: Moisten collagen with saline or hydrogel Secondary Dressing: Optifoam Non-Adhesive Dressing, 4x4 in 1 x Per Week/30 Days Discharge Instructions: Apply over primary dressing as directed. Secondary Dressing: Zetuvit Plus 4x8 in 1 x Per Week/30 Days Discharge Instructions: Apply over primary dressing as directed. Compression Wrap: CoFlex Calamine Unna Boot 4 x 6 (in/yd) 1 x Per Week/30 Days Discharge Instructions: Apply Coflex Calamine [employer] as directed. Patient Medications llergies: sulfa antibiotics, Voltaren, doxycycline, Neoprene, adhesive, latex A Notifications Medication Indication Start End 09/04/2023 lidocaine DOSE topical 5 % ointment - ointment topical once daily Electronic Signature(s) Signed: 09/04/2023 5:50:32 PM By: Duanne Guess MD FACS Entered By: Duanne Guess on 09/04/2023 16:39:08 [name] ([id number]) [id number] Page 6 of 13 -------------------------------------------------------------------------------- Problem List Details Patient Name: Date of Service: [name], [name] 09/04/2023 2:30 PM Medical Record Number: [id number] Patient Account Number: [id number] Date of Birth/Sex: Treating RN: [birth year] (77 y.o. F) Primary Care Provider: Eustaquio Boyden Other Clinician: Referring Provider: Treating Provider/Extender: Priscille Loveless in Treatment: 53 Active Problems ICD-10 Encounter Code Description Active Date MDM Diagnosis [phone]  Non-pressure chronic ulcer of other part of left lower leg with fat layer exposed 07/27/2022 No Yes I83.893 Varicose veins of bilateral lower extremities with other complications 07/27/2022 No Yes I87.2 Venous insufficiency (chronic) (peripheral) 07/27/2022 No Yes G62.9 Polyneuropathy, unspecified 07/27/2022 No Yes R60.0 Localized edema 07/27/2022 No Yes Inactive Problems ICD-10 Code Description Active Date Inactive Date L03.115 Cellulitis of right lower limb 10/22/2022 10/22/2022 L97.818 Non-pressure chronic ulcer of other part of right lower leg with other specified severity 03/01/2023 10/22/2022 Resolved Problems Electronic Signature(s) Signed: 09/04/2023 4:32:23 PM By: Duanne Guess MD FACS Entered By: Duanne Guess on 09/04/2023 16:32:23 -------------------------------------------------------------------------------- Progress Note Details Patient Name: Date of Service: [name], [name] 09/04/2023 2:30 PM Medical Record Number: [id number] Patient Account Number: [id number] Date of Birth/Sex: Treating RN: 03-26-1946 (77 y.o. F) Primary Care Provider: Eustaquio Boyden Other Clinician: Referring Provider: Treating Provider/Extender: Priscille Loveless in Treatment: [address] [place] ([id number]) [id number] Page 7 of 13 Subjective Chief Complaint Information obtained from Patient Patient presents for treatment of an open ulcer due to venous insufficiency History of Present Illness (HPI) ADMISSION This is a 77 year old woman with a history of chronic venous insufficiency status post saphenous vein ablations in 2010 and 2016. 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 She presents today with an open venous ulcer on her left lower leg. It has been present for a little over 2 weeks. She saw her primary care provider who apparently swabbed the wound and grew out Pseudomonas. She just completed a course of  ciprofloxacin for this. ABI was 0.91. She reports that she has had previous issues with ulcers in this same location, stemming back to a punch biopsy taken by a dermatologist many years ago. She has had several skin substitutes applied to the area that have ultimately resulted in healing on prior occasions. She has 2 small ulcers  [name], [name] ([id number]) [id number] Page 1 of 13 Visit Report for 09/04/2023 Chief Complaint Document Details Patient Name: Date of Service: [name] 09/04/2023 2:30 PM Medical Record Number: [id number] Patient Account Number: [id number] Date of Birth/Sex: Treating RN: August 13, 1946 (77 y.o. F) Primary Care Provider: Eustaquio Boyden Other Clinician: Referring Provider: Treating Provider/Extender: Priscille Loveless in Treatment: 47 Information Obtained from: Patient Chief Complaint Patient presents for treatment of an open ulcer due to venous insufficiency Electronic Signature(s) Signed: 09/04/2023 4:34:00 PM By: Duanne Guess MD FACS Entered By: Duanne Guess on 09/04/2023 16:34:00 -------------------------------------------------------------------------------- Debridement Details Patient Name: Date of Service: [name], [name] 09/04/2023 2:30 PM Medical Record Number: [id number] Patient Account Number: [id number] Date of Birth/Sex: Treating RN: 01-06-1946 (77 y.o. [name] Primary Care Provider: Eustaquio Boyden Other Clinician: Referring Provider: Treating Provider/Extender: Priscille Loveless in Treatment: 57 Debridement Performed for Assessment: Wound #2 Left,Posterior Lower Leg Performed By: Physician Duanne Guess, MD Debridement Type: Debridement Severity of Tissue Pre Debridement: Fat layer exposed Level of Consciousness (Pre-procedure): Awake and Alert Pre-procedure Verification/Time Out Yes - 15:05 Taken: Start Time: 15:05 Pain Control: Lidocaine 5% topical ointment Percent of Wound Bed Debrided: 100% T Area Debrided (cm): otal 4.52 Tissue and other material debrided: Non-Viable, Eschar, Slough, Slough Level: Non-Viable Tissue Debridement Description: Selective/Open Wound Instrument: Curette Bleeding: Minimum Hemostasis Achieved: Pressure Response to Treatment:  Procedure was tolerated well Level of Consciousness (Post- Awake and Alert procedure): Post Debridement Measurements of Total Wound Length: (cm) 3.6 Width: (cm) 1.6 Depth: (cm) 0.2 Volume: (cm) 0.905 Character of Wound/Ulcer Post Debridement: Improved Severity of Tissue Post Debridement: Fat layer exposed Post Procedure Diagnosis [name], [name] ([id number]) [id number] Page 2 of 13 Same as Pre-procedure Electronic Signature(s) Signed: 09/04/2023 5:03:07 PM By: Redmond Pulling RN, [name] Signed: 09/04/2023 5:50:32 PM By: Duanne Guess MD FACS Entered By: Redmond Pulling on 09/04/2023 15:07:57 -------------------------------------------------------------------------------- HPI Details Patient Name: Date of Service: [name], [name] 09/04/2023 2:30 PM Medical Record Number: [id number] Patient Account Number: [id number] Date of Birth/Sex: Treating RN: [birth year] (77 y.o. F) Primary Care Provider: Eustaquio Boyden Other Clinician: Referring Provider: Treating Provider/Extender: Priscille Loveless in Treatment: 31 History of Present Illness HPI Description: ADMISSION This is a 77 year old woman with a history of chronic venous insufficiency status post saphenous vein ablations in 2010 and 2016. 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 She presents today with an open venous ulcer on her left lower leg. It has been present for a little over 2 weeks. She saw her primary care provider who apparently swabbed the wound and grew out Pseudomonas. She just completed a course of ciprofloxacin for this. ABI was 0.91. She reports that she has had previous issues with ulcers in this same location, stemming back to a punch biopsy taken by a dermatologist many years ago. She has had several skin substitutes applied to the area that have ultimately resulted in healing on prior occasions. She has 2 small ulcers on her left medial  lower leg. There is slough accumulation in both of them. The more anterior of the 2 is quite small and has some soft slough on the surface, underneath which there is good granulation tissue. The more medial wound also has slough accumulation, but the underlying surface is fairly fibrotic and gritty. This is consistent with her provided history of multiple ulcers in the same location. 08/03/2022: The anterior wound is smaller today with just a little bit of slough  epithelium between the 2 open areas has gotten wider. The more proximal area has more epithelialization around the perimeter. Both have slough on the surface and remain fairly fibrotic. Moisture balance remains good. [name], [name] ([id number]) [id number] Page 4 of 13 08/07/2023: Both open areas are little bit smaller today. The band of epithelium between the 2 continues to enlarge. There is slough and eschar on both surfaces. 08/14/2023: No significant change to the wound dimensions. The surface  is rather dry and fibrotic again. 08/21/2023: The wound is smaller and the skin bridge between the 2 open areas has gotten wider. There is slough accumulation on the surfaces and the moisture balance is improved. 08/28/2023: Both open areas of the wound are smaller today. There is slough on the surface, but it is quite soft. Moisture balance is good. 09/04/2023: The band of epithelium between the 2 open areas continues to widen. The wound is very sensitive today, more so than usual. No concern for infection based upon visual inspection, however. There is some slough on the wound surface, which remains fairly fibrotic. Electronic Signature(s) Signed: 09/04/2023 4:34:52 PM By: Duanne Guess MD FACS Entered By: Duanne Guess on 09/04/2023 16:34:52 -------------------------------------------------------------------------------- Physical Exam Details Patient Name: Date of Service: BRITTANIA, BULLEY 09/04/2023 2:30 PM Medical Record Number: [id number] Patient Account Number: [id number] Date of Birth/Sex: Treating RN: 1946/06/10 (77 y.o. F) Primary Care Provider: Eustaquio Boyden Other Clinician: Referring Provider: Treating Provider/Extender: Priscille Loveless in Treatment: 48 Constitutional Hypertensive, asymptomatic. . . . no acute distress. Respiratory Normal work of breathing on room air.. Notes 09/04/2023: The band of epithelium between the 2 open areas continues to widen. The wound is very sensitive today, more so than usual. No concern for infection based upon visual inspection, however. There is some slough on the wound surface, which remains fairly fibrotic. Electronic Signature(s) Signed: 09/04/2023 4:37:24 PM By: Duanne Guess MD FACS Entered By: Duanne Guess on 09/04/2023 16:37:24 -------------------------------------------------------------------------------- Physician Orders Details Patient Name: Date of Service: [name], [name] 09/04/2023  2:30 PM Medical Record Number: [id number] Patient Account Number: [id number] Date of Birth/Sex: Treating RN: 09-05-1946 (77 y.o. [name] Primary Care Provider: Eustaquio Boyden Other Clinician: Referring Provider: Treating Provider/Extender: Priscille Loveless in Treatment: 19 Verbal / Phone Orders: No Diagnosis Coding ICD-10 Coding Code Description [phone] Non-pressure chronic ulcer of other part of left lower leg with fat layer exposed I83.893 Varicose veins of bilateral lower extremities with other complications I87.2 Venous insufficiency (chronic) (peripheral) G62.9 Polyneuropathy, unspecified [name], [name] ([id number]) [id number] Page 5 of 13 R60.0 Localized edema Follow-up Appointments ppointment in 1 week. - Dr. Lady Gary Room 3 Return A Anesthetic Wound #2 Left,Posterior Lower Leg (In clinic) Topical Lidocaine 4% applied to wound bed Bathing/ Shower/ Hygiene May shower with protection but do not get wound dressing(s) wet. Protect dressing(s) with water repellant cover (for example, large plastic bag) or a cast cover and may then take shower. Additional Orders / Instructions Other: - Recommend Nail Care Wound Treatment Wound #2 - Lower Leg Wound Laterality: Left, Posterior Cleanser: Soap and Water 1 x Per Week/30 Days Discharge Instructions: May shower and wash wound with dial antibacterial soap and water prior to dressing change. Peri-Wound Care: Ketoconazole Cream 2% 1 x Per Week/30 Days Discharge Instructions: Apply Ketoconazole as directed Peri-Wound Care: Triamcinolone 15 (g) 1 x Per Week/30 Days Discharge Instructions: Use triamcinolone 15 (g) as directed Peri-Wound Care: Zinc Oxide Ointment 30g tube 1 x Per Week/30 Days Discharge Instructions:  directed. Com pression Wrap: CoFlex Calamine Unna Boot 4 x 6 (in/yd) 1 x Per Week/30 Days Discharge Instructions: Apply Coflex Calamine [employer] as directed. 09/04/2023: The band of epithelium between the 2 open areas continues to widen. The wound  is very sensitive today, more so than usual. No concern for infection based upon visual inspection, however. There is some slough on the wound surface, which remains fairly fibrotic. I used a curette to debride the slough off of the wound surface. We will continue the topical gentamicin and mupirocin with Prisma silver collagen moistened with hydrogel and covering the wound with Optifoam to help maintain a moist environment. Continue calamine-based Unna boot compression. Follow-up in 1 week. Electronic Signature(s) Signed: 09/04/2023 4:42:35 PM By: Duanne Guess MD FACS Entered By: Duanne Guess on 09/04/2023 16:42:34 HxROS Details -------------------------------------------------------------------------------- [name] ([id number]) [id number] Page 11 of 13 Patient Name: Date of Service: [name], [name] 09/04/2023 2:30 PM Medical Record Number: [id number] Patient Account Number: [id number] Date of Birth/Sex: Treating RN: May 02, 1946 (77 y.o. F) Primary Care Provider: Eustaquio Boyden Other Clinician: Referring Provider: Treating Provider/Extender: Priscille Loveless in Treatment: 63 Information Obtained From Patient Eyes Medical History: Positive for: Cataracts - L eye Ear/Nose/Mouth/Throat Medical History: Negative for: Chronic sinus problems/congestion; Middle ear problems Hematologic/Lymphatic Medical History: Negative for: Anemia; Human Immunodeficiency Virus; Lymphedema Respiratory Medical History: Positive for: Asthma Past Medical History Notes: OSA on CPAP Cardiovascular Medical History: Positive for: Hypertension Past Medical History Notes: Cronic venous insufficiency 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 Gastrointestinal Medical History: Past Medical History Notes: liver cyst Endocrine Medical History: Negative for: Type I Diabetes; Type II Diabetes Musculoskeletal Medical  History: Past Medical History Notes: arthritis Neurologic Medical History: Positive for: Neuropathy - Feet and finger Negative for: Seizure Disorder Past Medical History Notes: restless leg syndrome Oncologic Medical History: Positive for: Received Chemotherapy - 2012; Received Radiation - 2012 HBO Extended History Items Eyes: Cataracts Immunizations Pneumococcal Vaccine: Received Pneumococcal Vaccination: TALYNN, CAYWOOD ([id number]) [id number] Page 12 of 13 Received Pneumococcal Vaccination On or After 60th Birthday: Yes Implantable Devices No devices added Hospitalization / Surgery History Type of Hospitalization/Surgery Lower extremity venography 2020 Intravascular ultrasound peripheral vascular intervention melanoma 2011 Family and Social History Cancer: Yes - Mother,Paternal Grandparents; Diabetes: Yes - Siblings; Heart Disease: Yes - Father; Hypertension: Yes - Father; Former smoker - quit in [employer]) Signed: 09/04/2023 5:50:32 PM By: Duanne Guess MD FACS Entered By: Duanne Guess on 09/04/2023 16:36:13 -------------------------------------------------------------------------------- SuperBill Details Patient Name: Date of Service: [name], [name] 09/04/2023 Medical Record Number: [id number] Patient Account Number: [id number] Date of Birth/Sex: Treating RN: [birth year] (77 y.o. F) Primary Care Provider: Eustaquio Boyden Other Clinician: Referring Provider: Treating Provider/Extender: Priscille Loveless in Treatment: 57 Diagnosis Coding ICD-10 Codes Code Description [phone] Non-pressure chronic ulcer of other part of left lower leg with fat layer exposed I83.893 Varicose veins of bilateral lower extremities with other complications I87.2 Venous insufficiency (chronic) (peripheral) G62.9 Polyneuropathy, unspecified R60.0 Localized edema Facility Procedures : CPT4 Code:  [id number] Description: 97597 - DEBRIDE WOUND 1ST 20 SQ CM OR < ICD-10 Diagnosis Description L97.822 Non-pressure chronic ulcer of other part of left lower leg with fat layer expose Modifier: d Quantity: 1 Physician Procedures : CPT4 Code Description Modifier [id number] 99214 - WC PHYS LEVEL 4 - EST PT ICD-10 Diagnosis Description L97.822 Non-pressure chronic ulcer of other part of left lower leg with fat layer exposed I83.893 Varicose veins of bilateral lower extremities  directed. Com pression Wrap: CoFlex Calamine Unna Boot 4 x 6 (in/yd) 1 x Per Week/30 Days Discharge Instructions: Apply Coflex Calamine [employer] as directed. 09/04/2023: The band of epithelium between the 2 open areas continues to widen. The wound  is very sensitive today, more so than usual. No concern for infection based upon visual inspection, however. There is some slough on the wound surface, which remains fairly fibrotic. I used a curette to debride the slough off of the wound surface. We will continue the topical gentamicin and mupirocin with Prisma silver collagen moistened with hydrogel and covering the wound with Optifoam to help maintain a moist environment. Continue calamine-based Unna boot compression. Follow-up in 1 week. Electronic Signature(s) Signed: 09/04/2023 4:42:35 PM By: Duanne Guess MD FACS Entered By: Duanne Guess on 09/04/2023 16:42:34 HxROS Details -------------------------------------------------------------------------------- [name] ([id number]) [id number] Page 11 of 13 Patient Name: Date of Service: [name], [name] 09/04/2023 2:30 PM Medical Record Number: [id number] Patient Account Number: [id number] Date of Birth/Sex: Treating RN: May 02, 1946 (77 y.o. F) Primary Care Provider: Eustaquio Boyden Other Clinician: Referring Provider: Treating Provider/Extender: Priscille Loveless in Treatment: 63 Information Obtained From Patient Eyes Medical History: Positive for: Cataracts - L eye Ear/Nose/Mouth/Throat Medical History: Negative for: Chronic sinus problems/congestion; Middle ear problems Hematologic/Lymphatic Medical History: Negative for: Anemia; Human Immunodeficiency Virus; Lymphedema Respiratory Medical History: Positive for: Asthma Past Medical History Notes: OSA on CPAP Cardiovascular Medical History: Positive for: Hypertension Past Medical History Notes: Cronic venous insufficiency 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 Gastrointestinal Medical History: Past Medical History Notes: liver cyst Endocrine Medical History: Negative for: Type I Diabetes; Type II Diabetes Musculoskeletal Medical  History: Past Medical History Notes: arthritis Neurologic Medical History: Positive for: Neuropathy - Feet and finger Negative for: Seizure Disorder Past Medical History Notes: restless leg syndrome Oncologic Medical History: Positive for: Received Chemotherapy - 2012; Received Radiation - 2012 HBO Extended History Items Eyes: Cataracts Immunizations Pneumococcal Vaccine: Received Pneumococcal Vaccination: TALYNN, CAYWOOD ([id number]) [id number] Page 12 of 13 Received Pneumococcal Vaccination On or After 60th Birthday: Yes Implantable Devices No devices added Hospitalization / Surgery History Type of Hospitalization/Surgery Lower extremity venography 2020 Intravascular ultrasound peripheral vascular intervention melanoma 2011 Family and Social History Cancer: Yes - Mother,Paternal Grandparents; Diabetes: Yes - Siblings; Heart Disease: Yes - Father; Hypertension: Yes - Father; Former smoker - quit in [employer]) Signed: 09/04/2023 5:50:32 PM By: Duanne Guess MD FACS Entered By: Duanne Guess on 09/04/2023 16:36:13 -------------------------------------------------------------------------------- SuperBill Details Patient Name: Date of Service: [name], [name] 09/04/2023 Medical Record Number: [id number] Patient Account Number: [id number] Date of Birth/Sex: Treating RN: [birth year] (77 y.o. F) Primary Care Provider: Eustaquio Boyden Other Clinician: Referring Provider: Treating Provider/Extender: Priscille Loveless in Treatment: 57 Diagnosis Coding ICD-10 Codes Code Description [phone] Non-pressure chronic ulcer of other part of left lower leg with fat layer exposed I83.893 Varicose veins of bilateral lower extremities with other complications I87.2 Venous insufficiency (chronic) (peripheral) G62.9 Polyneuropathy, unspecified R60.0 Localized edema Facility Procedures : CPT4 Code:  [id number] Description: 97597 - DEBRIDE WOUND 1ST 20 SQ CM OR < ICD-10 Diagnosis Description L97.822 Non-pressure chronic ulcer of other part of left lower leg with fat layer expose Modifier: d Quantity: 1 Physician Procedures : CPT4 Code Description Modifier [id number] 99214 - WC PHYS LEVEL 4 - EST PT ICD-10 Diagnosis Description L97.822 Non-pressure chronic ulcer of other part of left lower leg with fat layer exposed I83.893 Varicose veins of bilateral lower extremities  epithelium between the 2 open areas has gotten wider. The more proximal area has more epithelialization around the perimeter. Both have slough on the surface and remain fairly fibrotic. Moisture balance remains good. [name], [name] ([id number]) [id number] Page 4 of 13 08/07/2023: Both open areas are little bit smaller today. The band of epithelium between the 2 continues to enlarge. There is slough and eschar on both surfaces. 08/14/2023: No significant change to the wound dimensions. The surface  is rather dry and fibrotic again. 08/21/2023: The wound is smaller and the skin bridge between the 2 open areas has gotten wider. There is slough accumulation on the surfaces and the moisture balance is improved. 08/28/2023: Both open areas of the wound are smaller today. There is slough on the surface, but it is quite soft. Moisture balance is good. 09/04/2023: The band of epithelium between the 2 open areas continues to widen. The wound is very sensitive today, more so than usual. No concern for infection based upon visual inspection, however. There is some slough on the wound surface, which remains fairly fibrotic. Electronic Signature(s) Signed: 09/04/2023 4:34:52 PM By: Duanne Guess MD FACS Entered By: Duanne Guess on 09/04/2023 16:34:52 -------------------------------------------------------------------------------- Physical Exam Details Patient Name: Date of Service: BRITTANIA, BULLEY 09/04/2023 2:30 PM Medical Record Number: [id number] Patient Account Number: [id number] Date of Birth/Sex: Treating RN: 1946/06/10 (77 y.o. F) Primary Care Provider: Eustaquio Boyden Other Clinician: Referring Provider: Treating Provider/Extender: Priscille Loveless in Treatment: 48 Constitutional Hypertensive, asymptomatic. . . . no acute distress. Respiratory Normal work of breathing on room air.. Notes 09/04/2023: The band of epithelium between the 2 open areas continues to widen. The wound is very sensitive today, more so than usual. No concern for infection based upon visual inspection, however. There is some slough on the wound surface, which remains fairly fibrotic. Electronic Signature(s) Signed: 09/04/2023 4:37:24 PM By: Duanne Guess MD FACS Entered By: Duanne Guess on 09/04/2023 16:37:24 -------------------------------------------------------------------------------- Physician Orders Details Patient Name: Date of Service: [name], [name] 09/04/2023  2:30 PM Medical Record Number: [id number] Patient Account Number: [id number] Date of Birth/Sex: Treating RN: 09-05-1946 (77 y.o. [name] Primary Care Provider: Eustaquio Boyden Other Clinician: Referring Provider: Treating Provider/Extender: Priscille Loveless in Treatment: 19 Verbal / Phone Orders: No Diagnosis Coding ICD-10 Coding Code Description [phone] Non-pressure chronic ulcer of other part of left lower leg with fat layer exposed I83.893 Varicose veins of bilateral lower extremities with other complications I87.2 Venous insufficiency (chronic) (peripheral) G62.9 Polyneuropathy, unspecified [name], [name] ([id number]) [id number] Page 5 of 13 R60.0 Localized edema Follow-up Appointments ppointment in 1 week. - Dr. Lady Gary Room 3 Return A Anesthetic Wound #2 Left,Posterior Lower Leg (In clinic) Topical Lidocaine 4% applied to wound bed Bathing/ Shower/ Hygiene May shower with protection but do not get wound dressing(s) wet. Protect dressing(s) with water repellant cover (for example, large plastic bag) or a cast cover and may then take shower. Additional Orders / Instructions Other: - Recommend Nail Care Wound Treatment Wound #2 - Lower Leg Wound Laterality: Left, Posterior Cleanser: Soap and Water 1 x Per Week/30 Days Discharge Instructions: May shower and wash wound with dial antibacterial soap and water prior to dressing change. Peri-Wound Care: Ketoconazole Cream 2% 1 x Per Week/30 Days Discharge Instructions: Apply Ketoconazole as directed Peri-Wound Care: Triamcinolone 15 (g) 1 x Per Week/30 Days Discharge Instructions: Use triamcinolone 15 (g) as directed Peri-Wound Care: Zinc Oxide Ointment 30g tube 1 x Per Week/30 Days Discharge Instructions:  Apply Zinc Oxide to periwound with each dressing change Peri-Wound Care: Sween Lotion (Moisturizing lotion) 1 x Per Week/30 Days Discharge Instructions: Apply  moisturizing lotion as directed Topical: Gentamicin 1 x Per Week/30 Days Discharge Instructions: As directed by physician Topical: Mupirocin Ointment 1 x Per Week/30 Days Discharge Instructions: Apply Mupirocin (Bactroban) as instructed Topical: Skintegrity Hydrogel 4 (oz) 1 x Per Week/30 Days Discharge Instructions: Apply hydrogel as directed Prim Dressing: Promogran Prisma Matrix, 4.34 (sq in) (silver collagen) 1 x Per Week/30 Days ary Discharge Instructions: Moisten collagen with saline or hydrogel Secondary Dressing: Optifoam Non-Adhesive Dressing, 4x4 in 1 x Per Week/30 Days Discharge Instructions: Apply over primary dressing as directed. Secondary Dressing: Zetuvit Plus 4x8 in 1 x Per Week/30 Days Discharge Instructions: Apply over primary dressing as directed. Compression Wrap: CoFlex Calamine Unna Boot 4 x 6 (in/yd) 1 x Per Week/30 Days Discharge Instructions: Apply Coflex Calamine [employer] as directed. Patient Medications llergies: sulfa antibiotics, Voltaren, doxycycline, Neoprene, adhesive, latex A Notifications Medication Indication Start End 09/04/2023 lidocaine DOSE topical 5 % ointment - ointment topical once daily Electronic Signature(s) Signed: 09/04/2023 5:50:32 PM By: Duanne Guess MD FACS Entered By: Duanne Guess on 09/04/2023 16:39:08 [name] ([id number]) [id number] Page 6 of 13 -------------------------------------------------------------------------------- Problem List Details Patient Name: Date of Service: [name], [name] 09/04/2023 2:30 PM Medical Record Number: [id number] Patient Account Number: [id number] Date of Birth/Sex: Treating RN: [birth year] (77 y.o. F) Primary Care Provider: Eustaquio Boyden Other Clinician: Referring Provider: Treating Provider/Extender: Priscille Loveless in Treatment: 53 Active Problems ICD-10 Encounter Code Description Active Date MDM Diagnosis [phone]  Non-pressure chronic ulcer of other part of left lower leg with fat layer exposed 07/27/2022 No Yes I83.893 Varicose veins of bilateral lower extremities with other complications 07/27/2022 No Yes I87.2 Venous insufficiency (chronic) (peripheral) 07/27/2022 No Yes G62.9 Polyneuropathy, unspecified 07/27/2022 No Yes R60.0 Localized edema 07/27/2022 No Yes Inactive Problems ICD-10 Code Description Active Date Inactive Date L03.115 Cellulitis of right lower limb 10/22/2022 10/22/2022 L97.818 Non-pressure chronic ulcer of other part of right lower leg with other specified severity 03/01/2023 10/22/2022 Resolved Problems Electronic Signature(s) Signed: 09/04/2023 4:32:23 PM By: Duanne Guess MD FACS Entered By: Duanne Guess on 09/04/2023 16:32:23 -------------------------------------------------------------------------------- Progress Note Details Patient Name: Date of Service: [name], [name] 09/04/2023 2:30 PM Medical Record Number: [id number] Patient Account Number: [id number] Date of Birth/Sex: Treating RN: 03-26-1946 (77 y.o. F) Primary Care Provider: Eustaquio Boyden Other Clinician: Referring Provider: Treating Provider/Extender: Priscille Loveless in Treatment: [address] [place] ([id number]) [id number] Page 7 of 13 Subjective Chief Complaint Information obtained from Patient Patient presents for treatment of an open ulcer due to venous insufficiency History of Present Illness (HPI) ADMISSION This is a 77 year old woman with a history of chronic venous insufficiency status post saphenous vein ablations in 2010 and 2016. 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 She presents today with an open venous ulcer on her left lower leg. It has been present for a little over 2 weeks. She saw her primary care provider who apparently swabbed the wound and grew out Pseudomonas. She just completed a course of  ciprofloxacin for this. ABI was 0.91. She reports that she has had previous issues with ulcers in this same location, stemming back to a punch biopsy taken by a dermatologist many years ago. She has had several skin substitutes applied to the area that have ultimately resulted in healing on prior occasions. She has 2 small ulcers  Apply Zinc Oxide to periwound with each dressing change Peri-Wound Care: Sween Lotion (Moisturizing lotion) 1 x Per Week/30 Days Discharge Instructions: Apply  moisturizing lotion as directed Topical: Gentamicin 1 x Per Week/30 Days Discharge Instructions: As directed by physician Topical: Mupirocin Ointment 1 x Per Week/30 Days Discharge Instructions: Apply Mupirocin (Bactroban) as instructed Topical: Skintegrity Hydrogel 4 (oz) 1 x Per Week/30 Days Discharge Instructions: Apply hydrogel as directed Prim Dressing: Promogran Prisma Matrix, 4.34 (sq in) (silver collagen) 1 x Per Week/30 Days ary Discharge Instructions: Moisten collagen with saline or hydrogel Secondary Dressing: Optifoam Non-Adhesive Dressing, 4x4 in 1 x Per Week/30 Days Discharge Instructions: Apply over primary dressing as directed. Secondary Dressing: Zetuvit Plus 4x8 in 1 x Per Week/30 Days Discharge Instructions: Apply over primary dressing as directed. Compression Wrap: CoFlex Calamine Unna Boot 4 x 6 (in/yd) 1 x Per Week/30 Days Discharge Instructions: Apply Coflex Calamine [employer] as directed. Patient Medications llergies: sulfa antibiotics, Voltaren, doxycycline, Neoprene, adhesive, latex A Notifications Medication Indication Start End 09/04/2023 lidocaine DOSE topical 5 % ointment - ointment topical once daily Electronic Signature(s) Signed: 09/04/2023 5:50:32 PM By: Duanne Guess MD FACS Entered By: Duanne Guess on 09/04/2023 16:39:08 [name] ([id number]) [id number] Page 6 of 13 -------------------------------------------------------------------------------- Problem List Details Patient Name: Date of Service: [name], [name] 09/04/2023 2:30 PM Medical Record Number: [id number] Patient Account Number: [id number] Date of Birth/Sex: Treating RN: [birth year] (77 y.o. F) Primary Care Provider: Eustaquio Boyden Other Clinician: Referring Provider: Treating Provider/Extender: Priscille Loveless in Treatment: 53 Active Problems ICD-10 Encounter Code Description Active Date MDM Diagnosis [phone]  Non-pressure chronic ulcer of other part of left lower leg with fat layer exposed 07/27/2022 No Yes I83.893 Varicose veins of bilateral lower extremities with other complications 07/27/2022 No Yes I87.2 Venous insufficiency (chronic) (peripheral) 07/27/2022 No Yes G62.9 Polyneuropathy, unspecified 07/27/2022 No Yes R60.0 Localized edema 07/27/2022 No Yes Inactive Problems ICD-10 Code Description Active Date Inactive Date L03.115 Cellulitis of right lower limb 10/22/2022 10/22/2022 L97.818 Non-pressure chronic ulcer of other part of right lower leg with other specified severity 03/01/2023 10/22/2022 Resolved Problems Electronic Signature(s) Signed: 09/04/2023 4:32:23 PM By: Duanne Guess MD FACS Entered By: Duanne Guess on 09/04/2023 16:32:23 -------------------------------------------------------------------------------- Progress Note Details Patient Name: Date of Service: [name], [name] 09/04/2023 2:30 PM Medical Record Number: [id number] Patient Account Number: [id number] Date of Birth/Sex: Treating RN: 03-26-1946 (77 y.o. F) Primary Care Provider: Eustaquio Boyden Other Clinician: Referring Provider: Treating Provider/Extender: Priscille Loveless in Treatment: [address] [place] ([id number]) [id number] Page 7 of 13 Subjective Chief Complaint Information obtained from Patient Patient presents for treatment of an open ulcer due to venous insufficiency History of Present Illness (HPI) ADMISSION This is a 77 year old woman with a history of chronic venous insufficiency status post saphenous vein ablations in 2010 and 2016. 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 She presents today with an open venous ulcer on her left lower leg. It has been present for a little over 2 weeks. She saw her primary care provider who apparently swabbed the wound and grew out Pseudomonas. She just completed a course of  ciprofloxacin for this. ABI was 0.91. She reports that she has had previous issues with ulcers in this same location, stemming back to a punch biopsy taken by a dermatologist many years ago. She has had several skin substitutes applied to the area that have ultimately resulted in healing on prior occasions. She has 2 small ulcers  Apply Zinc Oxide to periwound with each dressing change Peri-Wound Care: Sween Lotion (Moisturizing lotion) 1 x Per Week/30 Days Discharge Instructions: Apply  moisturizing lotion as directed Topical: Gentamicin 1 x Per Week/30 Days Discharge Instructions: As directed by physician Topical: Mupirocin Ointment 1 x Per Week/30 Days Discharge Instructions: Apply Mupirocin (Bactroban) as instructed Topical: Skintegrity Hydrogel 4 (oz) 1 x Per Week/30 Days Discharge Instructions: Apply hydrogel as directed Prim Dressing: Promogran Prisma Matrix, 4.34 (sq in) (silver collagen) 1 x Per Week/30 Days ary Discharge Instructions: Moisten collagen with saline or hydrogel Secondary Dressing: Optifoam Non-Adhesive Dressing, 4x4 in 1 x Per Week/30 Days Discharge Instructions: Apply over primary dressing as directed. Secondary Dressing: Zetuvit Plus 4x8 in 1 x Per Week/30 Days Discharge Instructions: Apply over primary dressing as directed. Compression Wrap: CoFlex Calamine Unna Boot 4 x 6 (in/yd) 1 x Per Week/30 Days Discharge Instructions: Apply Coflex Calamine [employer] as directed. Patient Medications llergies: sulfa antibiotics, Voltaren, doxycycline, Neoprene, adhesive, latex A Notifications Medication Indication Start End 09/04/2023 lidocaine DOSE topical 5 % ointment - ointment topical once daily Electronic Signature(s) Signed: 09/04/2023 5:50:32 PM By: Duanne Guess MD FACS Entered By: Duanne Guess on 09/04/2023 16:39:08 [name] ([id number]) [id number] Page 6 of 13 -------------------------------------------------------------------------------- Problem List Details Patient Name: Date of Service: [name], [name] 09/04/2023 2:30 PM Medical Record Number: [id number] Patient Account Number: [id number] Date of Birth/Sex: Treating RN: [birth year] (77 y.o. F) Primary Care Provider: Eustaquio Boyden Other Clinician: Referring Provider: Treating Provider/Extender: Priscille Loveless in Treatment: 53 Active Problems ICD-10 Encounter Code Description Active Date MDM Diagnosis [phone]  Non-pressure chronic ulcer of other part of left lower leg with fat layer exposed 07/27/2022 No Yes I83.893 Varicose veins of bilateral lower extremities with other complications 07/27/2022 No Yes I87.2 Venous insufficiency (chronic) (peripheral) 07/27/2022 No Yes G62.9 Polyneuropathy, unspecified 07/27/2022 No Yes R60.0 Localized edema 07/27/2022 No Yes Inactive Problems ICD-10 Code Description Active Date Inactive Date L03.115 Cellulitis of right lower limb 10/22/2022 10/22/2022 L97.818 Non-pressure chronic ulcer of other part of right lower leg with other specified severity 03/01/2023 10/22/2022 Resolved Problems Electronic Signature(s) Signed: 09/04/2023 4:32:23 PM By: Duanne Guess MD FACS Entered By: Duanne Guess on 09/04/2023 16:32:23 -------------------------------------------------------------------------------- Progress Note Details Patient Name: Date of Service: [name], [name] 09/04/2023 2:30 PM Medical Record Number: [id number] Patient Account Number: [id number] Date of Birth/Sex: Treating RN: 03-26-1946 (77 y.o. F) Primary Care Provider: Eustaquio Boyden Other Clinician: Referring Provider: Treating Provider/Extender: Priscille Loveless in Treatment: [address] [place] ([id number]) [id number] Page 7 of 13 Subjective Chief Complaint Information obtained from Patient Patient presents for treatment of an open ulcer due to venous insufficiency History of Present Illness (HPI) ADMISSION This is a 77 year old woman with a history of chronic venous insufficiency status post saphenous vein ablations in 2010 and 2016. 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 She presents today with an open venous ulcer on her left lower leg. It has been present for a little over 2 weeks. She saw her primary care provider who apparently swabbed the wound and grew out Pseudomonas. She just completed a course of  ciprofloxacin for this. ABI was 0.91. She reports that she has had previous issues with ulcers in this same location, stemming back to a punch biopsy taken by a dermatologist many years ago. She has had several skin substitutes applied to the area that have ultimately resulted in healing on prior occasions. She has 2 small ulcers  Apply Zinc Oxide to periwound with each dressing change Peri-Wound Care: Sween Lotion (Moisturizing lotion) 1 x Per Week/30 Days Discharge Instructions: Apply  moisturizing lotion as directed Topical: Gentamicin 1 x Per Week/30 Days Discharge Instructions: As directed by physician Topical: Mupirocin Ointment 1 x Per Week/30 Days Discharge Instructions: Apply Mupirocin (Bactroban) as instructed Topical: Skintegrity Hydrogel 4 (oz) 1 x Per Week/30 Days Discharge Instructions: Apply hydrogel as directed Prim Dressing: Promogran Prisma Matrix, 4.34 (sq in) (silver collagen) 1 x Per Week/30 Days ary Discharge Instructions: Moisten collagen with saline or hydrogel Secondary Dressing: Optifoam Non-Adhesive Dressing, 4x4 in 1 x Per Week/30 Days Discharge Instructions: Apply over primary dressing as directed. Secondary Dressing: Zetuvit Plus 4x8 in 1 x Per Week/30 Days Discharge Instructions: Apply over primary dressing as directed. Compression Wrap: CoFlex Calamine Unna Boot 4 x 6 (in/yd) 1 x Per Week/30 Days Discharge Instructions: Apply Coflex Calamine [employer] as directed. Patient Medications llergies: sulfa antibiotics, Voltaren, doxycycline, Neoprene, adhesive, latex A Notifications Medication Indication Start End 09/04/2023 lidocaine DOSE topical 5 % ointment - ointment topical once daily Electronic Signature(s) Signed: 09/04/2023 5:50:32 PM By: Duanne Guess MD FACS Entered By: Duanne Guess on 09/04/2023 16:39:08 [name] ([id number]) [id number] Page 6 of 13 -------------------------------------------------------------------------------- Problem List Details Patient Name: Date of Service: [name], [name] 09/04/2023 2:30 PM Medical Record Number: [id number] Patient Account Number: [id number] Date of Birth/Sex: Treating RN: [birth year] (77 y.o. F) Primary Care Provider: Eustaquio Boyden Other Clinician: Referring Provider: Treating Provider/Extender: Priscille Loveless in Treatment: 53 Active Problems ICD-10 Encounter Code Description Active Date MDM Diagnosis [phone]  Non-pressure chronic ulcer of other part of left lower leg with fat layer exposed 07/27/2022 No Yes I83.893 Varicose veins of bilateral lower extremities with other complications 07/27/2022 No Yes I87.2 Venous insufficiency (chronic) (peripheral) 07/27/2022 No Yes G62.9 Polyneuropathy, unspecified 07/27/2022 No Yes R60.0 Localized edema 07/27/2022 No Yes Inactive Problems ICD-10 Code Description Active Date Inactive Date L03.115 Cellulitis of right lower limb 10/22/2022 10/22/2022 L97.818 Non-pressure chronic ulcer of other part of right lower leg with other specified severity 03/01/2023 10/22/2022 Resolved Problems Electronic Signature(s) Signed: 09/04/2023 4:32:23 PM By: Duanne Guess MD FACS Entered By: Duanne Guess on 09/04/2023 16:32:23 -------------------------------------------------------------------------------- Progress Note Details Patient Name: Date of Service: [name], [name] 09/04/2023 2:30 PM Medical Record Number: [id number] Patient Account Number: [id number] Date of Birth/Sex: Treating RN: 03-26-1946 (77 y.o. F) Primary Care Provider: Eustaquio Boyden Other Clinician: Referring Provider: Treating Provider/Extender: Priscille Loveless in Treatment: [address] [place] ([id number]) [id number] Page 7 of 13 Subjective Chief Complaint Information obtained from Patient Patient presents for treatment of an open ulcer due to venous insufficiency History of Present Illness (HPI) ADMISSION This is a 77 year old woman with a history of chronic venous insufficiency status post saphenous vein ablations in 2010 and 2016. 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 She presents today with an open venous ulcer on her left lower leg. It has been present for a little over 2 weeks. She saw her primary care provider who apparently swabbed the wound and grew out Pseudomonas. She just completed a course of  ciprofloxacin for this. ABI was 0.91. She reports that she has had previous issues with ulcers in this same location, stemming back to a punch biopsy taken by a dermatologist many years ago. She has had several skin substitutes applied to the area that have ultimately resulted in healing on prior occasions. She has 2 small ulcers

## 2023-09-05 NOTE — Progress Notes (Signed)
[name] ([id number]) [id number] Page 5 of 8 Referring Vora Clover: Treating [name]/Extender: Priscille Loveless in Treatment: 57 Multidisciplinary Care Plan reviewed with physician Active Inactive Venous Leg Ulcer Nursing Diagnoses: Knowledge deficit related to disease process and management Potential for venous Insuffiency (use before diagnosis confirmed) Goals: Patient will  maintain optimal edema control Date Initiated: 02/07/2023 Target Resolution Date: 09/06/2023 Goal Status: Active Interventions: Assess peripheral edema status every visit. Compression as ordered Treatment Activities: Therapeutic compression applied : 02/07/2023 Notes: Wound/Skin Impairment Nursing Diagnoses: Impaired tissue integrity Goals: Ulcer/skin breakdown will have a volume reduction of 30% by week 4 Date Initiated: 08/03/2022 Date Inactivated: 09/10/2022 Target Resolution Date: 08/31/2022 Unmet Reason: uncontrolled edema, Goal Status: Unmet acute infection Ulcer/skin breakdown will have a volume reduction of 50% by week 8 Date Initiated: 09/10/2022 Target Resolution Date: 09/06/2023 Goal Status: Active Interventions: Assess ulceration(s) every visit Treatment Activities: Skin care regimen initiated : 08/03/2022 Notes: [place] ordered. 10/30 RX Levo started. 09/10/22 Electronic Signature(s) Signed: 09/04/2023 5:03:07 PM By: Redmond Pulling RN, [name] Entered By: Redmond Pulling on 09/04/2023 14:57:50 -------------------------------------------------------------------------------- Pain Assessment Details Patient Name: Date of Service: [name], [name] 09/04/2023 2:30 PM Medical Record Number: [id number] Patient Account Number: [id number] Date of Birth/Sex: Treating RN: 26-Mar-1946 (77 y.o. [name] Primary Care Coyt Govoni: Eustaquio Boyden Other Clinician: Referring Lucita Montoya: Treating Cailen Texeira/Extender: Priscille Loveless in Treatment: 85 Active Problems Location of Pain Severity and Description of Pain Patient Has Paino No [name] ([id number]) [phone].pdf Page 6 of 8 Patient Has Paino No Site Locations Pain Management and Medication Current Pain Management: Electronic Signature(s) Signed: 09/04/2023 5:03:07 PM By: Redmond Pulling RN, [name] Entered By: Redmond Pulling on 09/04/2023  14:44:42 -------------------------------------------------------------------------------- Patient/Caregiver Education Details Patient Name: Date of Service: [name] 10/30/2024andnbsp2:30 PM Medical Record Number: [id number] Patient Account Number: [id number] Date of Birth/Gender: Treating RN: November 17, 1945 (77 y.o. [name] Primary Care Physician: Eustaquio Boyden Other Clinician: Referring Physician: Treating Physician/Extender: Priscille Loveless in Treatment: 90 Education Assessment Education Provided To: Patient Education Topics Provided Wound/Skin Impairment: Methods: Explain/Verbal Responses: State content correctly [employer]) Signed: 09/04/2023 5:03:07 PM By: Redmond Pulling RN, [name] Entered By: Redmond Pulling on 09/04/2023 14:58:03 -------------------------------------------------------------------------------- Wound Assessment Details Patient Name: Date of Service: [name] 09/04/2023 2:30 PM Medical Record Number: [id number] Patient Account Number: [id number] Date of Birth/Sex: Treating RN: Oct 03, 1946 (77 y.o. [address], [place] J ([id number]) [id number] Page 7 of 8 Primary Care Fount Bahe: Eustaquio Boyden Other Clinician: Referring Rose Hegner: Treating Ingrid Shifrin/Extender: Priscille Loveless in Treatment: 57 Wound Status Wound Number: 2 Primary Venous Leg Ulcer Etiology: Wound Location: Left, Posterior Lower Leg Wound Open Wounding Event: Blister Status: Date Acquired: 07/06/2022 Comorbid Cataracts, Asthma, Hypertension, Neuropathy, Received Weeks Of Treatment: 57 History: Chemotherapy, Received Radiation Clustered Wound: No Photos Wound Measurements Length: (cm) 3.6 Width: (cm) 1.6 Depth: (cm) 0.2 Area: (cm) 4.524 Volume: (cm) 0.905 % Reduction in Area: 34.2% % Reduction in Volume: -31.7% Epithelialization: Small (1-33%) Tunneling:  No Undermining: No Wound Description Classification: Full Thickness Without Exposed Suppo Wound Margin: Distinct, outline attached Exudate Amount: Medium Exudate Type: Serosanguineous Exudate Color: red, brown rt Structures Foul Odor After Cleansing: No Slough/Fibrino Yes Wound Bed Granulation Amount: Large (67-100%) Exposed Structure Granulation Quality: Red Fascia Exposed: No Necrotic Amount: Small (1-33%) Fat Layer (Subcutaneous Tissue) Exposed: Yes Necrotic Quality: Eschar, Adherent Slough Tendon Exposed: No Muscle Exposed: No Joint Exposed: No Bone Exposed: No Periwound Skin Texture Texture Color No Abnormalities  Noted: Yes No Abnormalities Noted: No Rubor: Yes Moisture No Abnormalities Noted: Yes Temperature / Pain Temperature: No Abnormality Tenderness on Palpation: Yes Treatment Notes Wound #2 (Lower Leg) Wound Laterality: Left, Posterior Cleanser Soap and Water Discharge Instruction: May shower and wash wound with dial antibacterial soap and water prior to dressing change. Peri-Wound Care Ketoconazole Cream 2% Discharge Instruction: Apply Ketoconazole as directed Triamcinolone 15 (g) Discharge Instruction: Use triamcinolone 15 (g) as directed Zinc Oxide Ointment 30g tube Discharge Instruction: Apply Zinc Oxide to periwound with each dressing change [name], [name] ([id number]) [id number] Page 8 of 8 Sween Lotion (Moisturizing lotion) Discharge Instruction: Apply moisturizing lotion as directed Topical Gentamicin Discharge Instruction: As directed by physician Mupirocin Ointment Discharge Instruction: Apply Mupirocin (Bactroban) as instructed Skintegrity Hydrogel 4 (oz) Discharge Instruction: Apply hydrogel as directed Primary Dressing Promogran Prisma Matrix, 4.34 (sq in) (silver collagen) Discharge Instruction: Moisten collagen with saline or hydrogel Secondary Dressing Optifoam Non-Adhesive Dressing, 4x4 in Discharge  Instruction: Apply over primary dressing as directed. Zetuvit Plus 4x8 in Discharge Instruction: Apply over primary dressing as directed. Secured With Compression Wrap CoFlex Calamine Unna Boot 4 x 6 (in/yd) Discharge Instruction: Apply Coflex Calamine [employer] as directed. Compression Stockings Add-Ons Electronic Signature(s) Signed: 09/04/2023 5:03:07 PM By: Redmond Pulling RN, [name] Entered By: Redmond Pulling on 09/04/2023 14:56:13 -------------------------------------------------------------------------------- Vitals Details Patient Name: Date of Service: [name], [name] 09/04/2023 2:30 PM Medical Record Number: [id number] Patient Account Number: [id number] Date of Birth/Sex: Treating RN: 09-02-1946 (77 y.o. [name] Primary Care Karilynn Carranza: Eustaquio Boyden Other Clinician: Referring Rhone Ozaki: Treating Vance Belcourt/Extender: Priscille Loveless in Treatment: 60 Vital Signs Time Taken: 14:44 Temperature (F): 98.2 Height (in): 63 Pulse (bpm): 74 Weight (lbs): 200 Respiratory Rate (breaths/min): 18 Body Mass Index (BMI): 35.4 Blood Pressure (mmHg): 171/92 Reference Range: 80 - 120 mg / dl Electronic Signature(s) Signed: 09/04/2023 5:03:07 PM By: Redmond Pulling RN, [name] Entered By: Redmond Pulling on 09/04/2023 14:44:35  Noted: Yes No Abnormalities Noted: No Rubor: Yes Moisture No Abnormalities Noted: Yes Temperature / Pain Temperature: No Abnormality Tenderness on Palpation: Yes Treatment Notes Wound #2 (Lower Leg) Wound Laterality: Left, Posterior Cleanser Soap and Water Discharge Instruction: May shower and wash wound with dial antibacterial soap and water prior to dressing change. Peri-Wound Care Ketoconazole Cream 2% Discharge Instruction: Apply Ketoconazole as directed Triamcinolone 15 (g) Discharge Instruction: Use triamcinolone 15 (g) as directed Zinc Oxide Ointment 30g tube Discharge Instruction: Apply Zinc Oxide to periwound with each dressing change [name], [name] ([id number]) [id number] Page 8 of 8 Sween Lotion (Moisturizing lotion) Discharge Instruction: Apply moisturizing lotion as directed Topical Gentamicin Discharge Instruction: As directed by physician Mupirocin Ointment Discharge Instruction: Apply Mupirocin (Bactroban) as instructed Skintegrity Hydrogel 4 (oz) Discharge Instruction: Apply hydrogel as directed Primary Dressing Promogran Prisma Matrix, 4.34 (sq in) (silver collagen) Discharge Instruction: Moisten collagen with saline or hydrogel Secondary Dressing Optifoam Non-Adhesive Dressing, 4x4 in Discharge  Instruction: Apply over primary dressing as directed. Zetuvit Plus 4x8 in Discharge Instruction: Apply over primary dressing as directed. Secured With Compression Wrap CoFlex Calamine Unna Boot 4 x 6 (in/yd) Discharge Instruction: Apply Coflex Calamine [employer] as directed. Compression Stockings Add-Ons Electronic Signature(s) Signed: 09/04/2023 5:03:07 PM By: Redmond Pulling RN, [name] Entered By: Redmond Pulling on 09/04/2023 14:56:13 -------------------------------------------------------------------------------- Vitals Details Patient Name: Date of Service: [name], [name] 09/04/2023 2:30 PM Medical Record Number: [id number] Patient Account Number: [id number] Date of Birth/Sex: Treating RN: 09-02-1946 (77 y.o. [name] Primary Care Karilynn Carranza: Eustaquio Boyden Other Clinician: Referring Rhone Ozaki: Treating Vance Belcourt/Extender: Priscille Loveless in Treatment: 60 Vital Signs Time Taken: 14:44 Temperature (F): 98.2 Height (in): 63 Pulse (bpm): 74 Weight (lbs): 200 Respiratory Rate (breaths/min): 18 Body Mass Index (BMI): 35.4 Blood Pressure (mmHg): 171/92 Reference Range: 80 - 120 mg / dl Electronic Signature(s) Signed: 09/04/2023 5:03:07 PM By: Redmond Pulling RN, [name] Entered By: Redmond Pulling on 09/04/2023 14:44:35  Noted: Yes No Abnormalities Noted: No Rubor: Yes Moisture No Abnormalities Noted: Yes Temperature / Pain Temperature: No Abnormality Tenderness on Palpation: Yes Treatment Notes Wound #2 (Lower Leg) Wound Laterality: Left, Posterior Cleanser Soap and Water Discharge Instruction: May shower and wash wound with dial antibacterial soap and water prior to dressing change. Peri-Wound Care Ketoconazole Cream 2% Discharge Instruction: Apply Ketoconazole as directed Triamcinolone 15 (g) Discharge Instruction: Use triamcinolone 15 (g) as directed Zinc Oxide Ointment 30g tube Discharge Instruction: Apply Zinc Oxide to periwound with each dressing change [name], [name] ([id number]) [id number] Page 8 of 8 Sween Lotion (Moisturizing lotion) Discharge Instruction: Apply moisturizing lotion as directed Topical Gentamicin Discharge Instruction: As directed by physician Mupirocin Ointment Discharge Instruction: Apply Mupirocin (Bactroban) as instructed Skintegrity Hydrogel 4 (oz) Discharge Instruction: Apply hydrogel as directed Primary Dressing Promogran Prisma Matrix, 4.34 (sq in) (silver collagen) Discharge Instruction: Moisten collagen with saline or hydrogel Secondary Dressing Optifoam Non-Adhesive Dressing, 4x4 in Discharge  Instruction: Apply over primary dressing as directed. Zetuvit Plus 4x8 in Discharge Instruction: Apply over primary dressing as directed. Secured With Compression Wrap CoFlex Calamine Unna Boot 4 x 6 (in/yd) Discharge Instruction: Apply Coflex Calamine [employer] as directed. Compression Stockings Add-Ons Electronic Signature(s) Signed: 09/04/2023 5:03:07 PM By: Redmond Pulling RN, [name] Entered By: Redmond Pulling on 09/04/2023 14:56:13 -------------------------------------------------------------------------------- Vitals Details Patient Name: Date of Service: [name], [name] 09/04/2023 2:30 PM Medical Record Number: [id number] Patient Account Number: [id number] Date of Birth/Sex: Treating RN: 09-02-1946 (77 y.o. [name] Primary Care Karilynn Carranza: Eustaquio Boyden Other Clinician: Referring Rhone Ozaki: Treating Vance Belcourt/Extender: Priscille Loveless in Treatment: 60 Vital Signs Time Taken: 14:44 Temperature (F): 98.2 Height (in): 63 Pulse (bpm): 74 Weight (lbs): 200 Respiratory Rate (breaths/min): 18 Body Mass Index (BMI): 35.4 Blood Pressure (mmHg): 171/92 Reference Range: 80 - 120 mg / dl Electronic Signature(s) Signed: 09/04/2023 5:03:07 PM By: Redmond Pulling RN, [name] Entered By: Redmond Pulling on 09/04/2023 14:44:35

## 2023-09-11 ENCOUNTER — Encounter (HOSPITAL_BASED_OUTPATIENT_CLINIC_OR_DEPARTMENT_OTHER): Payer: Medicare Other | Attending: General Surgery | Admitting: General Surgery

## 2023-09-11 DIAGNOSIS — G629 Polyneuropathy, unspecified: Secondary | ICD-10-CM | POA: Diagnosis not present

## 2023-09-11 DIAGNOSIS — J45909 Unspecified asthma, uncomplicated: Secondary | ICD-10-CM | POA: Insufficient documentation

## 2023-09-11 DIAGNOSIS — I872 Venous insufficiency (chronic) (peripheral): Secondary | ICD-10-CM | POA: Diagnosis not present

## 2023-09-11 DIAGNOSIS — L97822 Non-pressure chronic ulcer of other part of left lower leg with fat layer exposed: Secondary | ICD-10-CM | POA: Diagnosis not present

## 2023-09-11 DIAGNOSIS — Z792 Long term (current) use of antibiotics: Secondary | ICD-10-CM | POA: Insufficient documentation

## 2023-09-11 DIAGNOSIS — G4733 Obstructive sleep apnea (adult) (pediatric): Secondary | ICD-10-CM | POA: Diagnosis not present

## 2023-09-11 DIAGNOSIS — I890 Lymphedema, not elsewhere classified: Secondary | ICD-10-CM | POA: Diagnosis not present

## 2023-09-11 DIAGNOSIS — L97818 Non-pressure chronic ulcer of other part of right lower leg with other specified severity: Secondary | ICD-10-CM | POA: Insufficient documentation

## 2023-09-11 DIAGNOSIS — L97222 Non-pressure chronic ulcer of left calf with fat layer exposed: Secondary | ICD-10-CM | POA: Diagnosis not present

## 2023-09-11 DIAGNOSIS — I10 Essential (primary) hypertension: Secondary | ICD-10-CM | POA: Diagnosis not present

## 2023-09-11 NOTE — Progress Notes (Addendum)
EDMONIA, ZUKOWSKI ([id number]) [id number] Page 1 of 13 Visit Report for 09/11/2023 Chief Complaint Document Details Patient Name: Date of Service: [name], [name] 09/11/2023 3:15 PM Medical Record Number: [id number] Patient Account Number: [id number] Date of Birth/Sex: Treating RN: 1946-01-22 (77 y.o. F) Primary Care Provider: Eustaquio Boyden Other Clinician: Referring Provider: Treating Provider/Extender: Priscille Loveless in Treatment: 62 Information Obtained from: Patient Chief Complaint Patient presents for treatment of an open ulcer due to venous insufficiency Electronic Signature(s) Signed: 09/11/2023 3:46:36 PM By: Duanne Guess MD FACS Entered By: Duanne Guess on 09/11/2023 15:46:36 -------------------------------------------------------------------------------- Debridement Details Patient Name: Date of Service: [name], [name] 09/11/2023 3:15 PM Medical Record Number: [id number] Patient Account Number: [id number] Date of Birth/Sex: Treating RN: 1946/02/04 (77 y.o. [name] Primary Care Provider: Eustaquio Boyden Other Clinician: Referring Provider: Treating Provider/Extender: Priscille Loveless in Treatment: 58 Debridement Performed for Assessment: Wound #2 Left,Posterior Lower Leg Performed By: Physician Duanne Guess, MD The following information was scribed by: Redmond Pulling The information was scribed for: Duanne Guess Debridement Type: Debridement Severity of Tissue Pre Debridement: Fat layer exposed Level of Consciousness (Pre-procedure): Awake and Alert Pre-procedure Verification/Time Out Yes - 15:40 Taken: Start Time: 15:42 Pain Control: Lidocaine 5% topical ointment Percent of Wound Bed Debrided: 100% T Area Debrided (cm): otal 2.59 Tissue and other material debrided: Non-Viable, Slough, Subcutaneous, Slough Level: Skin/Subcutaneous Tissue Debridement Description:  Excisional Instrument: Curette Bleeding: Minimum Hemostasis Achieved: Pressure Response to Treatment: Procedure was tolerated well Level of Consciousness (Post- Awake and Alert procedure): Post Debridement Measurements of Total Wound Length: (cm) 2.2 Width: (cm) 1.5 Depth: (cm) 0.2 Volume: (cm) 0.518 Character of Wound/Ulcer Post Debridement: Improved Severity of Tissue Post Debridement: Fat layer exposed TYLIE, CADWALADER ([id number]) [id number] Page 2 of 13 Post Procedure Diagnosis Same as Pre-procedure Electronic Signature(s) Signed: 09/11/2023 3:58:55 PM By: Redmond Pulling RN, [name] Signed: 09/11/2023 4:42:33 PM By: Duanne Guess MD FACS Entered By: Redmond Pulling on 09/11/2023 15:43:33 -------------------------------------------------------------------------------- HPI Details Patient Name: Date of Service: [name], [name] 09/11/2023 3:15 PM Medical Record Number: [id number] Patient Account Number: [id number] Date of Birth/Sex: Treating RN: May 10, 1946 (77 y.o. F) Primary Care Provider: Eustaquio Boyden Other Clinician: Referring Provider: Treating Provider/Extender: Priscille Loveless in Treatment: 64 History of Present Illness HPI Description: ADMISSION This is a 77 year old woman with a history of chronic venous insufficiency status post saphenous vein ablations in 2010 and 2016. 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 She presents today with an open venous ulcer on her left lower leg. It has been present for a little over 2 weeks. She saw her primary care provider who apparently swabbed the wound and grew out Pseudomonas. She just completed a course of ciprofloxacin for this. ABI was 0.91. She reports that she has had previous issues with ulcers in this same location, stemming back to a punch biopsy taken by a dermatologist many years ago. She has had several skin substitutes applied to the area  that have ultimately resulted in healing on prior occasions. She has 2 small ulcers on her left medial lower leg. There is slough accumulation in both of them. The more anterior of the 2 is quite small and has some soft slough on the surface, underneath which there is good granulation tissue. The more medial wound also has slough accumulation, but the underlying surface is fairly fibrotic and gritty. This is consistent with her provided history of multiple ulcers in the same location.  08/03/2022: The anterior wound is smaller today with just a little bit of slough accumulation. The more medial wound continues to be very sensitive and fibrotic with slough buildup. 08/13/2022: The anterior wound is closed. The more medial wound remains sensitive with a fairly fibrotic surface. There is more granulation tissue filling in, however, and there is less slough than on prior occasions. 08/20/2022: The anterior wound remains closed. The more medial wound has filled with granulation tissue but still has a fair amount of slough on the surface and remains fairly tender. Unfortunately, she has developed 2 small ulcers just proximal to this. The fat layer is exposed in each. She says that over the weekend, she felt a burning sensation in that location. 08/27/2022: The 2 small wounds proximal to the main wound have merged into a single site. The wounds look a little bit dry, but they are quite clean without significant slough accumulation. They remain quite tender. 09/03/2022: All of the wounds have now merged into 1 large wound. There is a strong odor coming from the wound and the surface is not particularly viable. It is extremely painful for her today. 09/10/2022: The wound is less black and purple this week but still does not look particularly viable. The surface is desiccated. The culture that I took last week returned with a polymicrobial population including Pseudomonas. I prescribed both Augmentin and  levofloxacin. She has not yet picked up levofloxacin, as her pharmacy only notified her yesterday that it was ready. We have ordered a [employer] topical compounded antibiotic, but it has not yet come. 09/17/2022: The wound surface is markedly improved today. There is still an area of grayish-looking muscle but the rest appears significantly more viable. There is still a layer of slough on the surface. She still has a couple days left of oral antibiotic therapy. She has her Jodie Echevaria compounded topical antibiotic with her today. 09/24/2022: She has completed her oral antibiotic therapy. The wound surface is much cleaner today and more viable without any necrotic tissue. It is a bit desiccated, however. 10/01/2022: The moisture balance of the wound has improved. There is a layer of yellow slough on the surface, but beneath this, the wound is more pink. 10/08/2022: The wound is smaller and cleaner today with a layer of slough present. She is having less pain. 12/18; this patient has a new wound on the right lateral ankle which apparently started after a cat scratched her. Secondarily she managed to drop a cake pan on the same area. This is very painful. The original wound was on the left posterior calf we have been using [employer] under an [employer]. The Unna boot fell down and apparently she was in for a nurse visit perhaps last week and that 1 fell down as well This patient has central venous mediated hypertension. For member correctly she has a stent placed in the left common iliac artery by Dr. Randie Heinz some years ago [facility] Thurner syndrome] she also has had venous ablations and I believe a history of a DVT The patient has compression pumps at home but she does not use them 10/30/2022: The patient says that her wounds burned all week with the [facility] classic in place. She has a fair amount of slough and nonviable subcutaneous tissue present on the right ankle. The left posterior calf wound is  cleaner than I have seen it to date. Both remain fairly tender. HARMONI, BERTELSEN ([id number]) [id number] Page 3 of 13 11/14/2022: Both wounds have substantial  slough and eschar accumulation. They remain extremely tender. 11/21/2022: The wounds are cleaner this week, but still have slough and eschar accumulation. She continues to describe burning pain in both wounds, but upon further questioning, she actually has burning in both of her feet that radiates up her legs and includes to the wounds. 11/28/2022: Both wounds have slough and eschar accumulation, as well as adherent silver alginate that was difficult to remove. She continues to have pain out of proportion to the extent of her wounds. Her PCP did initiate Lyrica which she started taking last night. The dose is quite low, only 25 mg, but apparently there is a plan for upward titration, assuming she tolerates the medication. 12/05/2022: The wounds are both a little bit smaller today, but have significant slough accumulation, as usual. They remain exquisitely tender. She is now taking Lyrica twice a day. 12/19/2022: Both of her wounds look significantly better this week. There is slough buildup on both, but they are smaller. She seems to be getting a good response from Lyrica as she is having much less pain. 12/26/2022: The wound on her right lateral ankle is nearly closed. The wound on her left posterior calf is smaller but still has a lot of slough accumulation. They remain tender. 01/02/2023: The right lateral ankle wound is down to just a couple of millimeters. It is much less tender than on prior occasions. There is minimal slough buildup. The wound on her posterior calf continues to contract, as well. It has thick slough buildup and remains fairly tender. 01/09/2023: The right lateral ankle wound is nearly closed. He remains tender. There is just a little eschar on the surface. The wound on her posterior calf has improved  quite a bit. There is still slough on the surface, but the more proximal area has epithelialized substantially and there is no longer any gray discoloration to her tissue. 3/13; patient presents for follow-up. Her right lateral ankle wound is almost healed. She has a wound to her left posterior calf with slough and granulation tissue. We have been using Santyl under Unna boots bilaterally to the lower extremities. She has no issues or complaints today. 01/30/2023: The right lateral ankle wound is down to just a pinhole under a layer of eschar. The left posterior calf wound is quite a bit improved since the last time I saw it. There is still a layer of slough on the surface but there has been more epithelialization. 02/07/2023: The right lateral ankle wound still has some eschar on the surface. The left posterior calf wound is about the same size, but with more areas of epithelialization. 02/20/2023: The right lateral ankle wound is healed. The left posterior calf wound is smaller and is essentially flush with the surrounding skin, but the surface is quite dry. Her leg wrap slipped and her calf is quite swollen above the level of the wound. 03/01/2023: The right lateral ankle wound reopened and she also suffered a fall that resulted in a small wound on her right anterior tibial surface. The left posterior calf wound has a much better moisture balance today. There is a layer of slough on the surface. The [employer] worked much better for her as far as compression this week. 03/06/2023: The right lateral ankle wound has a layer of eschar on the surface and is too painful for me to debride. The anterior tibial surface wound is more or less healed; it is a very superficial abrasion at this point. The left posterior calf  wound has a layer of slough on the surface. Her edema control is better. 03/13/2023: The right lateral ankle wound has a layer of eschar on the surface. Once this was debrided, it was found to be  healed. The anterior tibial surface wound has a layer of stuck silver alginate on the surface. This was removed to reveal a very superficial abrasion but not much change from last week. The left posterior calf wound is smaller and has its usual layer of accumulated slough. 03/25/2023: The anterior tibial surface wound has eschar and silver alginate stuck to it. Once this was removed, the wound was found to be healed. The left posterior calf wound is roughly the same size and has a little bit thicker layer of accumulated slough. 04/03/2023: She has had more drainage from the posterior calf wound over the last week and the layer of slough is thicker. She also says it is more tender. 04/10/2023: Continued increase in drainage from her wound. The wound remains tender with a layer of slough on the surface. It does not appear as viable this week. She also has erythema, swelling, and warmth in her entire left lower leg extending into the midfoot. 04/17/2023: The wound is about the same size this week. There is fibrotic adherent slough, but the drainage is not as profuse. She is currently taking levofloxacin. She is complaining of an increase in the burning sensation at her wound. 04/24/2023: The wound measured smaller this week and is much cleaner-looking. She completed her course of levofloxacin. Her PCP increased her dose of Lyrica and she reports that the burning has stopped. 05/01/2023: The wound is smaller again today and quite clean with only minimal slough on the surface. 05/08/2023: The wound is smaller again today. It is fairly clean, but the surface is a bit fibrotic. Moisture balance is better. Her leg is more swollen and erythematous today, however. 05/15/2023: The wound is about the same size today. The surface has very little slough accumulation, but remains fibrotic. Moisture balance is good. She is responding nicely to the oral Keflex she is taking and her leg is less erythematous and  edematous. 05/22/2023: The wound is unchanged in size. There is quite a bit of slough on the surface but the moisture balance is better. It is still fibrotic, but a little bit less so. Her leg is no longer erythematous, but she does have some persistent edema. 05/29/2023: The wound measured a little bit smaller today. It is dry again and the surface is quite fibrotic. The edema in her leg has improved. 06/12/2023: The wound measures slightly larger today. The moisture balance has improved markedly. There is soft slough on the wound surface. Edema control is improved. 06/19/2023: The wound measured slightly smaller today. There is a band of epithelium dividing the most proximal portion of the wound from the remainder. There is slough accumulation and moisture balance is improved. 06/26/2023: The band of epithelium between the 2 open areas has gotten wider. There is some slough accumulation on the surfaces along with a little bit of eschar. Moisture balance continues to be good. 07/03/2023: The band of epithelium continues to expand. The moisture balance remains good and the surface, particularly of the more distal aspect of the wound continues to improve. Slough and eschar accumulation, as usual. 07/10/2023: The wound measured smaller today. Moisture balance is good and the tissue underneath the layer of slough that has accumulated continues to improve. 07/17/2023: The wound is smaller again today. Moisture balance is appropriate.  There is a little bit of slough overlying granulation tissue. AUNESTI, RAITT ([id number]) [id number] Page 4 of 13 07/24/2023: The band of epithelium between the 2 open areas has gotten wider. The more proximal area has more epithelialization around the perimeter. Both have slough on the surface and remain fairly fibrotic. Moisture balance remains good. 08/07/2023: Both open areas are little bit smaller today. The band of epithelium between the 2 continues  to enlarge. There is slough and eschar on both surfaces. 08/14/2023: No significant change to the wound dimensions. The surface is rather dry and fibrotic again. 08/21/2023: The wound is smaller and the skin bridge between the 2 open areas has gotten wider. There is slough accumulation on the surfaces and the moisture balance is improved. 08/28/2023: Both open areas of the wound are smaller today. There is slough on the surface, but it is quite soft. Moisture balance is good. 09/04/2023: The band of epithelium between the 2 open areas continues to widen. The wound is very sensitive today, more so than usual. No concern for infection based upon visual inspection, however. There is some slough on the wound surface, which remains fairly fibrotic. 09/11/2023: Both open areas are little bit smaller today. There is slough accumulation on the surface. The underlying tissue is still somewhat fibrotic, but the color is good. Edema control is good. Electronic Signature(s) Signed: 09/11/2023 3:47:36 PM By: Duanne Guess MD FACS Entered By: Duanne Guess on 09/11/2023 15:47:35 -------------------------------------------------------------------------------- Physical Exam Details Patient Name: Date of Service: [name], [name] 09/11/2023 3:15 PM Medical Record Number: [id number] Patient Account Number: [id number] Date of Birth/Sex: Treating RN: 28-Apr-1946 (77 y.o. F) Primary Care Provider: Eustaquio Boyden Other Clinician: Referring Provider: Treating Provider/Extender: Priscille Loveless in Treatment: 58 Constitutional . . . . no acute distress. Respiratory Normal work of breathing on room air.. Notes 09/11/2023: Both open areas are little bit smaller today. There is slough accumulation on the surface. The underlying tissue is still somewhat fibrotic, but the color is good. Edema control is good. Electronic Signature(s) Signed: 09/11/2023 3:49:37 PM By: Duanne Guess MD  FACS Entered By: Duanne Guess on 09/11/2023 15:49:37 -------------------------------------------------------------------------------- Physician Orders Details Patient Name: Date of Service: [name], [name] 09/11/2023 3:15 PM Medical Record Number: [id number] Patient Account Number: [id number] Date of Birth/Sex: Treating RN: 1946-02-22 (77 y.o. [name] Primary Care Provider: Eustaquio Boyden Other Clinician: Referring Provider: Treating Provider/Extender: Priscille Loveless in Treatment: 28 Verbal / Phone Orders: No Diagnosis Coding ICD-10 Coding Code Description SHAMBRIA, LEMASTERS ([id number]) [id number] Page 5 of 13 [phone] Non-pressure chronic ulcer of other part of left lower leg with fat layer exposed I83.893 Varicose veins of bilateral lower extremities with other complications I87.2 Venous insufficiency (chronic) (peripheral) G62.9 Polyneuropathy, unspecified R60.0 Localized edema Follow-up Appointments ppointment in 1 week. - Dr. Lady Gary Room 3 Return A Anesthetic Wound #2 Left,Posterior Lower Leg (In clinic) Topical Lidocaine 4% applied to wound bed Bathing/ Shower/ Hygiene May shower with protection but do not get wound dressing(s) wet. Protect dressing(s) with water repellant cover (for example, large plastic bag) or a cast cover and may then take shower. Additional Orders / Instructions Other: - Recommend Nail Care Wound Treatment Wound #2 - Lower Leg Wound Laterality: Left, Posterior Cleanser: Soap and Water 1 x Per Week/30 Days Discharge Instructions: May shower and wash wound with dial antibacterial soap and water prior to dressing change. Peri-Wound Care: Ketoconazole Cream 2% 1 x Per Week/30 Days Discharge Instructions: Apply Ketoconazole as  directed Peri-Wound Care: Triamcinolone 15 (g) 1 x Per Week/30 Days Discharge Instructions: Use triamcinolone 15 (g) as directed Peri-Wound Care: Zinc Oxide  Ointment 30g tube 1 x Per Week/30 Days Discharge Instructions: Apply Zinc Oxide to periwound with each dressing change Peri-Wound Care: Sween Lotion (Moisturizing lotion) 1 x Per Week/30 Days Discharge Instructions: Apply moisturizing lotion as directed Topical: Gentamicin 1 x Per Week/30 Days Discharge Instructions: As directed by physician Topical: Mupirocin Ointment 1 x Per Week/30 Days Discharge Instructions: Apply Mupirocin (Bactroban) as instructed Topical: Skintegrity Hydrogel 4 (oz) 1 x Per Week/30 Days Discharge Instructions: Apply hydrogel as directed Prim Dressing: Promogran Prisma Matrix, 4.34 (sq in) (silver collagen) 1 x Per Week/30 Days ary Discharge Instructions: Moisten collagen with saline or hydrogel Secondary Dressing: Optifoam Non-Adhesive Dressing, 4x4 in 1 x Per Week/30 Days Discharge Instructions: Apply over primary dressing as directed. Secondary Dressing: Zetuvit Plus 4x8 in 1 x Per Week/30 Days Discharge Instructions: Apply over primary dressing as directed. Compression Wrap: CoFlex Calamine Unna Boot 4 x 6 (in/yd) 1 x Per Week/30 Days Discharge Instructions: Apply Coflex Calamine [employer] as directed. Patient Medications llergies: sulfa antibiotics, Voltaren, doxycycline, Neoprene, adhesive, latex A Notifications Medication Indication Start End 09/11/2023 lidocaine DOSE topical 5 % ointment - ointment topical once daily Electronic Signature(s) Signed: 09/11/2023 3:58:55 PM By: Redmond Pulling RN, [name] Signed: 09/11/2023 4:42:33 PM By: Duanne Guess MD FACS Entered By: Redmond Pulling on 09/11/2023 15:55:25 [name] ([id number]) [id number] Page 6 of 13 -------------------------------------------------------------------------------- Problem List Details Patient Name: Date of Service: [name], [name] 09/11/2023 3:15 PM Medical Record Number: [id number] Patient Account Number: [id number] Date of Birth/Sex: Treating  RN: 04-04-1946 (77 y.o. F) Primary Care Provider: Eustaquio Boyden Other Clinician: Referring Provider: Treating Provider/Extender: Priscille Loveless in Treatment: 33 Active Problems ICD-10 Encounter Code Description Active Date MDM Diagnosis [phone] Non-pressure chronic ulcer of other part of left lower leg with fat layer exposed 07/27/2022 No Yes I83.893 Varicose veins of bilateral lower extremities with other complications 07/27/2022 No Yes I87.2 Venous insufficiency (chronic) (peripheral) 07/27/2022 No Yes G62.9 Polyneuropathy, unspecified 07/27/2022 No Yes R60.0 Localized edema 07/27/2022 No Yes Inactive Problems ICD-10 Code Description Active Date Inactive Date L03.115 Cellulitis of right lower limb 10/22/2022 10/22/2022 L97.818 Non-pressure chronic ulcer of other part of right lower leg with other specified severity 03/01/2023 10/22/2022 Resolved Problems Electronic Signature(s) Signed: 09/11/2023 3:46:19 PM By: Duanne Guess MD FACS Entered By: Duanne Guess on 09/11/2023 15:46:19 -------------------------------------------------------------------------------- Progress Note Details Patient Name: Date of Service: [name] 09/11/2023 3:15 PM Medical Record Number: [id number] Patient Account Number: [id number] Date of Birth/Sex: Treating RN: 1946-01-03 (77 y.o. F) Primary Care Provider: Eustaquio Boyden Other Clinician: Referring Provider: Treating Provider/Extender: Katharina Caper [place], [name] ([id number]) [id number] Page 7 of 13 Weeks in Treatment: 71 Subjective Chief Complaint Information obtained from Patient Patient presents for treatment of an open ulcer due to venous insufficiency History of Present Illness (HPI) ADMISSION This is a 77 year old woman with a history of chronic venous insufficiency status post saphenous vein ablations in 2010 and 2016. She also has a history  of May-Thurner syndrome status post stenting. She presents today with an open venous ulcer on her left lower leg. It has been present for a little over 2 weeks. She saw her primary care provider who apparently swabbed the wound and grew out Pseudomonas. She just completed a course of ciprofloxacin for this. ABI was 0.91. She reports that she has had previous issues with  ulcers in this same location, stemming back to a punch biopsy taken by a dermatologist many years ago. She has had several skin substitutes applied to the area that have ultimately resulted in healing on prior occasions. She has 2 small ulcers on her left medial lower leg. There is slough accumulation in both of them. The more anterior of the 2 is quite small and has some soft slough on the surface, underneath which there is good granulation tissue. The more medial wound also has slough accumulation, but the underlying surface is fairly fibrotic and gritty. This is consistent with her provided history of multiple ulcers in the same location. 08/03/2022: The anterior wound is smaller today with just a little bit of slough accumulation. The more medial wound continues to be very sensitive and fibrotic with slough buildup. 08/13/2022: The anterior wound is closed. The more medial wound remains sensitive with a fairly fibrotic surface. There is more granulation tissue filling in, however, and there is less slough than on prior occasions. 08/20/2022: The anterior wound remains closed. The more medial wound has filled with granulation tissue but still has a fair amount of slough on the surface and remains fairly tender. Unfortunately, she has developed 2 small ulcers just proximal to this. The fat layer is exposed in each. She says that over the weekend, she felt a burning sensation in that location. 08/27/2022: The 2 small wounds proximal to the main wound have merged into a single site. The wounds look a little bit dry, but they are quite  clean without significant slough accumulation. They remain quite tender. 09/03/2022: All of the wounds have now merged into 1 large wound. There is a strong odor coming from the wound and the surface is not particularly viable. It is extremely painful for her today. 09/10/2022: The wound is less black and purple this week but still does not look particularly viable. The surface is desiccated. The culture that I took last week returned with a polymicrobial population including Pseudomonas. I prescribed both Augmentin and levofloxacin. She has not yet picked up levofloxacin, as her pharmacy only notified her yesterday that it was ready. We have ordered a [employer] topical compounded antibiotic, but it has not yet come. 09/17/2022: The wound surface is markedly improved today. There is still an area of grayish-looking muscle but the rest appears significantly more viable. There is still a layer of slough on the surface. She still has a couple days left of oral antibiotic therapy. She has her Jodie Echevaria compounded topical antibiotic with her today. 09/24/2022: She has completed her oral antibiotic therapy. The wound surface is much cleaner today and more viable without any necrotic tissue. It is a bit desiccated, however. 10/01/2022: The moisture balance of the wound has improved. There is a layer of yellow slough on the surface, but beneath this, the wound is more pink. 10/08/2022: The wound is smaller and cleaner today with a layer of slough present. She is having less pain. 12/18; this patient has a new wound on the right lateral ankle which apparently started after a cat scratched her. Secondarily she managed to drop a cake pan on the same area. This is very painful. The original wound was on the left posterior calf we have been using [employer] under an [employer]. The Unna boot fell down and apparently she was in for a nurse visit perhaps last week and that 1 fell down as well This patient has  central venous mediated hypertension. For member correctly she  has a stent placed in the left common iliac artery by Dr. 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 She has a fair amount of slough and nonviable subcutaneous tissue present on the right ankle. The left posterior calf wound is cleaner than I have seen it to date. Both remain fairly tender. 11/14/2022: Both wounds have substantial slough and eschar accumulation. They remain extremely tender. 11/21/2022: The wounds are cleaner this week, but still have slough and eschar accumulation. She continues to describe burning pain in both wounds, but upon further questioning, she actually has burning in both of her feet that radiates up her legs and includes to the wounds. 11/28/2022: Both wounds have slough and eschar accumulation, as well as adherent silver alginate that was difficult to remove. She continues to have pain out of proportion to the extent of her wounds. Her PCP did initiate Lyrica which she started taking last night. The dose is quite low, only 25 mg, but apparently there is a plan for upward titration, assuming she tolerates the medication. 12/05/2022: The wounds are both a little bit smaller today, but have significant slough accumulation, as usual. They remain exquisitely tender. She is now taking Lyrica twice a day. 12/19/2022: Both of her wounds look significantly better this week. There is slough buildup on both, but they are smaller. She seems to be getting a good response from Lyrica as she is having much less pain. 12/26/2022: The wound on her right lateral ankle is nearly closed. The wound on her left posterior calf is smaller but still has a lot of slough accumulation. They remain  tender. 01/02/2023: The right lateral ankle wound is down to just a couple of millimeters. It is much less tender than on prior occasions. There is minimal slough buildup. The wound on her posterior calf continues to contract, as well. It has thick slough buildup and remains fairly tender. 01/09/2023: The right lateral ankle wound is nearly closed. He remains tender. There is just a little eschar on the surface. The wound on her posterior calf has improved quite a bit. There is still slough on the surface, but the more proximal area has epithelialized substantially and there is no longer any gray discoloration to her tissue. ARIANNAH, [name] ([id number]) [id number] Page 8 of 13 3/13; patient presents for follow-up. Her right lateral ankle wound is almost healed. She has a wound to her left posterior calf with slough and granulation tissue. We have been using Santyl under Unna boots bilaterally to the lower extremities. She has no issues or complaints today. 01/30/2023: The right lateral ankle wound is down to just a pinhole under a layer of eschar. The left posterior calf wound is quite a bit improved since the last time I saw it. There is still a layer of slough on the surface but there has been more epithelialization. 02/07/2023: The right lateral ankle wound still has some eschar on the surface. The left posterior calf wound is about the same size, but with more areas of epithelialization. 02/20/2023: The right lateral ankle wound is healed. The left posterior calf wound is smaller and is essentially flush with the surrounding skin, but the surface is quite dry. Her leg wrap slipped and her calf is quite swollen  above the level of the wound. 03/01/2023: The right lateral ankle wound reopened and she also suffered a fall that resulted in a small wound on her right anterior tibial surface. The left posterior calf wound has a much better moisture balance today. There is a layer of  slough on the surface. The [employer] worked much better for her as far as compression this week. 03/06/2023: The right lateral ankle wound has a layer of eschar on the surface and is too painful for me to debride. The anterior tibial surface wound is more or less healed; it is a very superficial abrasion at this point. The left posterior calf wound has a layer of slough on the surface. Her edema control is better. 03/13/2023: The right lateral ankle wound has a layer of eschar on the surface. Once this was debrided, it was found to be healed. The anterior tibial surface wound has a layer of stuck silver alginate on the surface. This was removed to reveal a very superficial abrasion but not much change from last week. The left posterior calf wound is smaller and has its usual layer of accumulated slough. 03/25/2023: The anterior tibial surface wound has eschar and silver alginate stuck to it. Once this was removed, the wound was found to be healed. The left posterior calf wound is roughly the same size and has a little bit thicker layer of accumulated slough. 04/03/2023: She has had more drainage from the posterior calf wound over the last week and the layer of slough is thicker. She also says it is more tender. 04/10/2023: Continued increase in drainage from her wound. The wound remains tender with a layer of slough on the surface. It does not appear as viable this week. She also has erythema, swelling, and warmth in her entire left lower leg extending into the midfoot. 04/17/2023: The wound is about the same size this week. There is fibrotic adherent slough, but the drainage is not as profuse. She is currently taking levofloxacin. She is complaining of an increase in the burning sensation at her wound. 04/24/2023: The wound measured smaller this week and is much cleaner-looking. She completed her course of levofloxacin. Her PCP increased her dose of Lyrica and she reports that the burning has  stopped. 05/01/2023: The wound is smaller again today and quite clean with only minimal slough on the surface. 05/08/2023: The wound is smaller again today. It is fairly clean, but the surface is a bit fibrotic. Moisture balance is better. Her leg is more swollen and erythematous today, however. 05/15/2023: The wound is about the same size today. The surface has very little slough accumulation, but remains fibrotic. Moisture balance is good. She is responding nicely to the oral Keflex she is taking and her leg is less erythematous and edematous. 05/22/2023: The wound is unchanged in size. There is quite a bit of slough on the surface but the moisture balance is better. It is still fibrotic, but a little bit less so. Her leg is no longer erythematous, but she does have some persistent edema. 05/29/2023: The wound measured a little bit smaller today. It is dry again and the surface is quite fibrotic. The edema in her leg has improved. 06/12/2023: The wound measures slightly larger today. The moisture balance has improved markedly. There is soft slough on the wound surface. Edema control is improved. 06/19/2023: The wound measured slightly smaller today. There is a band of epithelium dividing the most proximal portion of the wound from the remainder. There  is slough accumulation and moisture balance is improved. 06/26/2023: The band of epithelium between the 2 open areas has gotten wider. There is some slough accumulation on the surfaces along with a little bit of eschar. Moisture balance continues to be good. 07/03/2023: The band of epithelium continues to expand. The moisture balance remains good and the surface, particularly of the more distal aspect of the wound continues to improve. Slough and eschar accumulation, as usual. 07/10/2023: The wound measured smaller today. Moisture balance is good and the tissue underneath the layer of slough that has accumulated continues to improve. 07/17/2023: The wound is  smaller again today. Moisture balance is appropriate. There is a little bit of slough overlying granulation tissue. 07/24/2023: The band of epithelium between the 2 open areas has gotten wider. The more proximal area has more epithelialization around the perimeter. Both have slough on the surface and remain fairly fibrotic. Moisture balance remains good. 08/07/2023: Both open areas are little bit smaller today. The band of epithelium between the 2 continues to enlarge. There is slough and eschar on both surfaces. 08/14/2023: No significant change to the wound dimensions. The surface is rather dry and fibrotic again. 08/21/2023: The wound is smaller and the skin bridge between the 2 open areas has gotten wider. There is slough accumulation on the surfaces and the moisture balance is improved. 08/28/2023: Both open areas of the wound are smaller today. There is slough on the surface, but it is quite soft. Moisture balance is good. 09/04/2023: The band of epithelium between the 2 open areas continues to widen. The wound is very sensitive today, more so than usual. No concern for infection based upon visual inspection, however. There is some slough on the wound surface, which remains fairly fibrotic. 09/11/2023: Both open areas are little bit smaller today. There is slough accumulation on the surface. The underlying tissue is still somewhat fibrotic, but the color is good. Edema control is good. Patient History Information obtained from Patient. Family History Cancer - Mother,Paternal Grandparents, Diabetes - Siblings, Heart Disease - Father, Hypertension - Father. [name], [name] ([id number]) [id number] Page 9 of 81 Social History Former smoker - quit in 1967. Medical History Eyes Patient has history of Cataracts - L eye Ear/Nose/Mouth/Throat Denies history of Chronic sinus problems/congestion, Middle ear problems Hematologic/Lymphatic Denies history of Anemia, Human  Immunodeficiency Virus, Lymphedema Respiratory Patient has history of Asthma Cardiovascular Patient has history of Hypertension Endocrine Denies history of Type I Diabetes, Type II Diabetes Neurologic Patient has history of Neuropathy - Feet and finger Denies history of Seizure Disorder Oncologic Patient has history of Received Chemotherapy - 2012, Received Radiation - 2012 Hospitalization/Surgery History - Lower extremity venography 2020. - Intravascular ultrasound. - peripheral vascular intervention. - melanoma 2011. 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 Vitals Time Taken: 3:30 PM, Height: 63 in, Weight: 200 lbs, BMI: 35.4, Temperature: 98.7 F, Pulse: 78 bpm, Respiratory Rate: 18 breaths/min, Blood Pressure: 130/74 mmHg. Respiratory Normal work of breathing on room air.. General Notes: 09/11/2023: Both open areas are little bit smaller today. There is slough accumulation on the surface. The underlying tissue is still somewhat fibrotic, but the color is good. Edema control is good. Integumentary (Hair, Skin) Wound #2 status is Open. Original cause of wound was Blister. The date acquired was: 07/06/2022. The wound has been in treatment  58 weeks. The wound is located on the Left,Posterior Lower Leg. The wound measures 2.2cm length x 1.5cm width x 0.2cm depth; 2.592cm^2 area and 0.518cm^3 volume. There is Fat Layer (Subcutaneous Tissue) exposed. There is no tunneling or undermining noted. There is a medium amount of serosanguineous drainage noted. The wound margin is distinct with the outline attached to the wound base. There is large (67-100%) red granulation within the wound bed. There is a small (1-33%) amount of necrotic  tissue within the wound bed including Eschar and Adherent Slough. The periwound skin appearance had no abnormalities noted for texture. The periwound skin appearance had no abnormalities noted for moisture. The periwound skin appearance exhibited: Rubor. Periwound temperature was noted as No Abnormality. The periwound has tenderness on palpation. Assessment Active Problems ICD-10 Non-pressure chronic ulcer of other part of left lower leg with fat layer exposed Varicose veins of bilateral lower extremities with other complications Venous insufficiency (chronic) (peripheral) Polyneuropathy, unspecified Localized edema CARNELL, BRONNER ([id number]) [id number] Page 10 of 13 Procedures Wound #2 Pre-procedure diagnosis of Wound #2 is a Venous Leg Ulcer located on the Left,Posterior Lower Leg .Severity of Tissue Pre Debridement is: Fat layer exposed. There was a Excisional Skin/Subcutaneous Tissue Debridement with a total area of 2.59 sq cm performed by Duanne Guess, MD. With the following instrument(s): Curette to remove Non-Viable tissue/material. Material removed includes Subcutaneous Tissue and Slough and after achieving pain control using Lidocaine 5% topical ointment. No specimens were taken. A time out was conducted at 15:40, prior to the start of the procedure. A Minimum amount of bleeding was controlled with Pressure. The procedure was tolerated well. Post Debridement Measurements: 2.2cm length x 1.5cm width x 0.2cm depth; 0.518cm^3 volume. Character of Wound/Ulcer Post Debridement is improved. Severity of Tissue Post Debridement is: Fat layer exposed. Post procedure Diagnosis Wound #2: Same as Pre-Procedure Pre-procedure diagnosis of Wound #2 is a Venous Leg Ulcer located on the Left,Posterior Lower Leg . There was a Radio broadcast assistant Compression Therapy Procedure by Redmond Pulling, RN. Post procedure Diagnosis Wound #2: Same as Pre-Procedure Plan Follow-up  Appointments: Return Appointment in 1 week. - Dr. Lady Gary Room 3 Anesthetic: Wound #2 Left,Posterior Lower Leg: (In clinic) Topical Lidocaine 4% applied to wound bed Bathing/ Shower/ Hygiene: May shower with protection but do not get wound dressing(s) wet. Protect dressing(s) with water repellant cover (for example, large plastic bag) or a cast cover and may then take shower. Additional Orders / Instructions: Other: - Recommend Nail Care The following medication(s) was prescribed: lidocaine topical 5 % ointment ointment topical once daily was prescribed at facility WOUND #2: - Lower Leg Wound Laterality: Left, Posterior Cleanser: Soap and Water 1 x Per Week/30 Days Discharge Instructions: May shower and wash wound with dial antibacterial soap and water prior to dressing change. Peri-Wound Care: Ketoconazole Cream 2% 1 x Per Week/30 Days Discharge Instructions: Apply Ketoconazole as directed Peri-Wound Care: Triamcinolone 15 (g) 1 x Per Week/30 Days Discharge Instructions: Use triamcinolone 15 (g) as directed Peri-Wound Care: Zinc Oxide Ointment 30g tube 1 x Per Week/30 Days Discharge Instructions: Apply Zinc Oxide to periwound with each dressing change Peri-Wound Care: Sween Lotion (Moisturizing lotion) 1 x Per Week/30 Days Discharge Instructions: Apply moisturizing lotion as directed Topical: Gentamicin 1 x Per Week/30 Days Discharge Instructions: As directed by physician Topical: Mupirocin Ointment 1 x Per Week/30 Days Discharge Instructions: Apply Mupirocin (Bactroban) as instructed Topical: Skintegrity Hydrogel 4 (oz) 1 x Per Week/30 Days Discharge Instructions: Apply hydrogel as directed Prim  Dressing: Promogran Prisma Matrix, 4.34 (sq in) (silver collagen) 1 x Per Week/30 Days ary Discharge Instructions: Moisten collagen with saline or hydrogel Secondary Dressing: Optifoam Non-Adhesive Dressing, 4x4 in 1 x Per Week/30 Days Discharge Instructions: Apply over primary dressing as  directed. Secondary Dressing: Zetuvit Plus 4x8 in 1 x Per Week/30 Days Discharge Instructions: Apply over primary dressing as directed. Com pression Wrap: CoFlex Calamine Unna Boot 4 x 6 (in/yd) 1 x Per Week/30 Days Discharge Instructions: Apply Coflex Calamine [employer] as directed. 09/11/2023: Both open areas are little bit smaller today. There is slough accumulation on the surface. The underlying tissue is still somewhat fibrotic, but the color is good. Edema control is good. I used a curette to debride slough and subcutaneous tissue from the wound surface. We will continue the mixture of topical gentamicin and mupirocin with Prisma silver collagen, Optifoam to routine moisture, and and a calamine Coflex wrap. Follow-up in 1 week. Electronic Signature(s) Signed: 09/12/2023 6:10:25 PM By: Shawn Stall RN, [name] Signed: 09/13/2023 7:56:51 AM By: Duanne Guess MD FACS Previous Signature: 09/11/2023 3:50:29 PM Version By: Duanne Guess MD FACS Entered By: Shawn Stall on 09/12/2023 16:00:52 JESSICIA, WESCHLER ([id number]) [id number] Page 11 of 13 -------------------------------------------------------------------------------- HxROS Details Patient Name: Date of Service: [name], [name] 09/11/2023 3:15 PM Medical Record Number: [id number] Patient Account Number: [id number] Date of Birth/Sex: Treating RN: 06-Dec-1945 (77 y.o. F) Primary Care Provider: Eustaquio Boyden Other Clinician: Referring Provider: Treating Provider/Extender: Priscille Loveless in Treatment: 67 Information Obtained From Patient Eyes Medical History: Positive for: Cataracts - L eye Ear/Nose/Mouth/Throat Medical History: Negative for: Chronic sinus problems/congestion; Middle ear problems Hematologic/Lymphatic Medical History: Negative for: Anemia; Human Immunodeficiency Virus; Lymphedema Respiratory Medical History: Positive for: Asthma Past Medical History  Notes: OSA on CPAP Cardiovascular Medical History: Positive for: Hypertension Past Medical History Notes: Cronic venous insufficiency 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 Gastrointestinal Medical History: Past Medical History Notes: liver cyst Endocrine Medical History: Negative for: Type I Diabetes; Type II Diabetes Musculoskeletal Medical History: Past Medical History Notes: arthritis Neurologic Medical History: Positive for: Neuropathy - Feet and finger Negative for: Seizure Disorder Past Medical History Notes: restless leg syndrome Oncologic Medical History: Positive for: Received Chemotherapy - 2012; Received Radiation - 2012 ORLINE, BLACKLEY ([id number]) [id number] Page 12 of 13 HBO Extended History Items Eyes: Cataracts Immunizations Pneumococcal Vaccine: Received Pneumococcal Vaccination: Yes Received Pneumococcal Vaccination On or After 60th Birthday: Yes Implantable Devices No devices added Hospitalization / Surgery History Type of Hospitalization/Surgery Lower extremity venography 2020 Intravascular ultrasound peripheral vascular intervention melanoma 2011 Family and Social History Cancer: Yes - Mother,Paternal Grandparents; Diabetes: Yes - Siblings; Heart Disease: Yes - Father; Hypertension: Yes - Father; Former smoker - quit in [employer]) Signed: 09/11/2023 4:42:33 PM By: Duanne Guess MD FACS Entered By: Duanne Guess on 09/11/2023 15:49:14 -------------------------------------------------------------------------------- SuperBill Details Patient Name: Date of Service: DEJANNA, SABOURIN 09/11/2023 Medical Record Number: [id number] Patient Account Number: [id number] Date of Birth/Sex: Treating RN: [birth year] (77 y.o. F) Primary Care Provider: Eustaquio Boyden Other Clinician: Referring Provider: Treating Provider/Extender: Priscille Loveless in  Treatment: 58 Diagnosis Coding ICD-10 Codes Code Description [phone] Non-pressure chronic ulcer of other part of left lower leg with fat layer exposed I83.893 Varicose veins of bilateral lower extremities with other complications I87.2 Venous insufficiency (chronic) (peripheral) G62.9 Polyneuropathy, unspecified R60.0 Localized edema Facility Procedures : CPT4 Code: [id number] Description: 11042 - DEB SUBQ TISSUE 20 SQ CM/< ICD-10 Diagnosis Description L97.822  Non-pressure chronic ulcer of other part of left lower leg with fat layer expo Modifier: sed Quantity: 1 Physician Procedures : CPT4 Code Description Modifier [id number] 99214 - WC PHYS LEVEL 4 - EST PT ICD-10 Diagnosis Description L97.822 Non-pressure chronic ulcer of other part of left lower leg with fat layer exposed I83.893 Varicose veins of bilateral lower extremities with  other complications I87.2 Venous insufficiency (chronic) (peripheral) R60.0 Localized edema ELLANI, BILBRO ([id number]) [id number] 11042 - WC PHYS SUBQ TISS 20 SQ CM 1 ICD-10 Diagnosis Description L97.822 Non-pressure  chronic ulcer of other part of left lower leg with fat layer exposed Quantity: 1 pdf Page 13 of 13 Electronic Signature(s) Signed: 09/11/2023 3:52:29 PM By: Duanne Guess MD FACS Entered By: Duanne Guess on 09/11/2023 15:52:29

## 2023-09-11 NOTE — Progress Notes (Signed)
[name], [name] ([id number]) [id number] Page 1 of 8 Visit Report for 09/11/2023 Arrival Information Details Patient Name: Date of Service: [name], [name] 09/11/2023 3:15 PM Medical Record Number: [id number] Patient Account Number: [id number] Date of Birth/Sex: Treating RN: [birth year] (77 y.o. [name] Primary Care Carolyna Yerian: Eustaquio Boyden Other Clinician: Referring Cabrina Shiroma: Treating Jilberto Vanderwall/Extender: Priscille Loveless in Treatment: 28 Visit Information History Since Last Visit Added or deleted any medications: No Patient Arrived: Ambulatory Any new allergies or adverse reactions: No Arrival Time: 15:36 Had a fall or experienced change in No Accompanied By: self activities of daily living that may affect Transfer Assistance: None risk of falls: Patient Identification Verified: Yes Signs or symptoms of abuse/neglect since last visito No Secondary Verification Process Completed: Yes Hospitalized since last visit: No Patient Requires Transmission-Based Precautions: No Implantable device outside of the clinic excluding No Patient Has Alerts: No cellular tissue based products placed in the center since last visit: Has Dressing in Place as Prescribed: Yes Has Compression in Place as Prescribed: Yes Pain Present Now: No Electronic Signature(s) Signed: 09/11/2023 3:58:55 PM By: Redmond Pulling RN, [name] Entered By: Redmond Pulling on 09/11/2023 12:36:42 -------------------------------------------------------------------------------- Compression Therapy Details Patient Name: Date of Service: [name], [name] 09/11/2023 3:15 PM Medical Record Number: [id number] Patient Account Number: [id number] Date of Birth/Sex: Treating RN: 1946-08-05 (77 y.o. [name] Primary Care [place] Fant: Eustaquio Boyden Other Clinician: Referring Allessandra Bernardi: Treating Najiyah Paris/Extender: Priscille Loveless in Treatment:  45 Compression Therapy Performed for Wound Assessment: Wound #2 Left,Posterior Lower Leg Performed By: Clinician Redmond Pulling, RN Compression Type: Henriette Combs Post Procedure Diagnosis Same as Pre-procedure Electronic Signature(s) Signed: 09/11/2023 3:58:55 PM By: Redmond Pulling RN, [name] Entered By: Redmond Pulling on 09/11/2023 12:43:51 [name] ([id number]) [id number] Page 2 of 8 -------------------------------------------------------------------------------- Encounter Discharge Information Details Patient Name: Date of Service: [name], [name] 09/11/2023 3:15 PM Medical Record Number: [id number] Patient Account Number: [id number] Date of Birth/Sex: Treating RN: [birth year] (77 y.o. [name] Primary Care Thara Searing: Eustaquio Boyden Other Clinician: Referring Mckaylah Bettendorf: Treating Mishael Haran/Extender: Priscille Loveless in Treatment: 30 Encounter Discharge Information Items Post Procedure Vitals Discharge Condition: Stable Temperature (F): 98.7 Ambulatory Status: Ambulatory Pulse (bpm): 78 Discharge Destination: Home Respiratory Rate (breaths/min): 18 Transportation: Private Auto Blood Pressure (mmHg): 130/74 Accompanied By: self Schedule Follow-up Appointment: Yes Clinical Summary of Care: Patient Declined Electronic Signature(s) Signed: 09/11/2023 3:58:55 PM By: Redmond Pulling RN, [name] Entered By: Redmond Pulling on 09/11/2023 12:58:31 -------------------------------------------------------------------------------- Lower Extremity Assessment Details Patient Name: Date of Service: [name], [name] 09/11/2023 3:15 PM Medical Record Number: [id number] Patient Account Number: [id number] Date of Birth/Sex: Treating RN: 02/20/1946 (77 y.o. [name] Primary Care Akita Maxim: Eustaquio Boyden Other Clinician: Referring Idalia Allbritton: Treating Abbygail Willhoite/Extender: Priscille Loveless in Treatment: 58 Edema  Assessment Assessed: [Left: No] [Right: No] Edema: [Left: Ye] [Right: s] Calf Left: Right: Point of Measurement: 31 cm From Medial Instep 42.9 cm Ankle Left: Right: Point of Measurement: 9 cm From Medial Instep 23.5 cm Vascular Assessment Extremity colors, hair growth, and conditions: Extremity Color: [Left:Normal] Hair Growth on Extremity: [Left:Yes] Temperature of Extremity: [Left:Warm] Capillary Refill: [Left:< 3 seconds] Dependent Rubor: [Left:No No] Electronic Signature(s) Signed: 09/11/2023 3:58:55 PM By: Redmond Pulling RN, [name] Entered By: Redmond Pulling on 09/11/2023 12:37:52 [name] ([id number]) [id number] Page 3 of 8 -------------------------------------------------------------------------------- Multi Wound Chart Details Patient Name: Date of Service: [name], [name] 09/11/2023 3:15 PM Medical Record Number: [id number] Patient Account Number: [id number]  Date of Birth/Sex: Treating RN: August 06, 1946 (77 y.o. F) Primary Care Maxima Skelton: Eustaquio Boyden Other Clinician: Referring Hasana Alcorta: Treating Javonte Elenes/Extender: Priscille Loveless in Treatment: 58 Vital Signs Height(in): 63 Pulse(bpm): 78 Weight(lbs): 200 Blood Pressure(mmHg): 130/74 Body Mass Index(BMI): 35.4 Temperature(F): 98.7 Respiratory Rate(breaths/min): 18 [2:Photos:] [N/A:N/A] Left, Posterior Lower Leg N/A N/A Wound Location: Blister N/A N/A Wounding Event: Venous Leg Ulcer N/A N/A Primary Etiology: Cataracts, Asthma, Hypertension, N/A N/A Comorbid History: Neuropathy, Received Chemotherapy, Received Radiation 07/06/2022 N/A N/A Date Acquired: 26 N/A N/A Weeks of Treatment: Open N/A N/A Wound Status: No N/A N/A Wound Recurrence: 2.2x1.5x0.2 N/A N/A Measurements L x W x D (cm) 2.592 N/A N/A A (cm) : rea 0.518 N/A N/A Volume (cm) : 62.30% N/A N/A % Reduction in A rea: 24.60% N/A N/A % Reduction in Volume: Full Thickness Without  Exposed N/A N/A Classification: Support Structures Medium N/A N/A Exudate A mount: Serosanguineous N/A N/A Exudate Type: red, brown N/A N/A Exudate Color: Distinct, outline attached N/A N/A Wound Margin: Large (67-100%) N/A N/A Granulation A mount: Red N/A N/A Granulation Quality: Small (1-33%) N/A N/A Necrotic A mount: Eschar, Adherent Slough N/A N/A Necrotic Tissue: Fat Layer (Subcutaneous Tissue): Yes N/A N/A Exposed Structures: Fascia: No Tendon: No Muscle: No Joint: No Bone: No Small (1-33%) N/A N/A Epithelialization: Debridement - Excisional N/A N/A Debridement: Pre-procedure Verification/Time Out 15:40 N/A N/A Taken: Lidocaine 5% topical ointment N/A N/A Pain Control: Subcutaneous, Slough N/A N/A Tissue Debrided: Skin/Subcutaneous Tissue N/A N/A Level: 2.59 N/A N/A Debridement A (sq cm): rea Curette N/A N/A Instrument: Minimum N/A N/A Bleeding: Pressure N/A N/A Hemostasis A chieved: Procedure was tolerated well N/A N/A Debridement Treatment Response: 2.2x1.5x0.2 N/A N/A Post Debridement Measurements L x W x D (cm) 0.518 N/A N/A Post Debridement Volume: (cm) Scarring: Yes N/A N/A Periwound Skin TextureZAMORIA, BOSS ([id number]) [id number] Page 4 of 8 Maceration: No N/A N/A Periwound Skin Moisture: Dry/Scaly: No Rubor: Yes N/A N/A Periwound Skin Color: No Abnormality N/A N/A Temperature: Yes N/A N/A Tenderness on Palpation: Compression Therapy N/A N/A Procedures Performed: Debridement Treatment Notes Electronic Signature(s) Signed: 09/11/2023 3:46:28 PM By: Duanne Guess MD FACS Entered By: Duanne Guess on 09/11/2023 12:46:28 -------------------------------------------------------------------------------- Multi-Disciplinary Care Plan Details Patient Name: Date of Service: [name], [name] 09/11/2023 3:15 PM Medical Record Number: [id number] Patient Account Number: [id number] Date of Birth/Sex:  Treating RN: January 18, 1946 (77 y.o. [name] Primary Care Charnette Younkin: Eustaquio Boyden Other Clinician: Referring Johnluke Haugen: Treating Cherrill Scrima/Extender: Priscille Loveless in Treatment: 78 Multidisciplinary Care Plan reviewed with physician Active Inactive Venous Leg Ulcer Nursing Diagnoses: Knowledge deficit related to disease process and management Potential for venous Insuffiency (use before diagnosis confirmed) Goals: Patient will maintain optimal edema control Date Initiated: 02/07/2023 Target Resolution Date: 09/06/2023 Goal Status: Active Interventions: Assess peripheral edema status every visit. Compression as ordered Treatment Activities: Therapeutic compression applied : 02/07/2023 Notes: Wound/Skin Impairment Nursing Diagnoses: Impaired tissue integrity Goals: Ulcer/skin breakdown will have a volume reduction of 30% by week 4 Date Initiated: 08/03/2022 Date Inactivated: 09/10/2022 Target Resolution Date: 08/31/2022 Unmet Reason: uncontrolled edema, Goal Status: Unmet acute infection Ulcer/skin breakdown will have a volume reduction of 50% by week 8 Date Initiated: 09/10/2022 Target Resolution Date: 09/06/2023 Goal Status: Active Interventions: Assess ulceration(s) every visit Treatment Activities: Skin care regimen initiated : 08/03/2022 EMBERLYNN, RIGGAN ([id number]) [id number].pdf Page 5 of 8 Notes: Jodie Echevaria ordered. 10/30 RX Levo started. 09/10/22 Electronic Signature(s) Signed: 09/11/2023 3:58:55 PM By: Redmond Pulling RN, [name] Entered By:  Redmond Pulling on 09/11/2023 12:38:54 -------------------------------------------------------------------------------- Pain Assessment Details Patient Name: Date of Service: [name], [name] 09/11/2023 3:15 PM Medical Record Number: [id number] Patient Account Number: [id number] Date of Birth/Sex: Treating RN: November 26, 1945 (77 y.o. [name] Primary Care Alnita Aybar: Eustaquio Boyden Other Clinician: Referring Amarion Portell: Treating Linet Brash/Extender: Priscille Loveless in Treatment: 78 Active Problems Location of Pain Severity and Description of Pain Patient Has Paino No Site Locations Pain Management and Medication Current Pain Management: Electronic Signature(s) Signed: 09/11/2023 3:58:55 PM By: Redmond Pulling RN, [name] Entered By: Redmond Pulling on 09/11/2023 12:37:39 -------------------------------------------------------------------------------- Patient/Caregiver Education Details Patient Name: Date of Service: [name], [name] 11/6/2024andnbsp3:15 PM Medical Record Number: [id number] Patient Account Number: [id number] Date of Birth/Gender: Treating RN: 09/02/1946 (77 y.o. [name] Primary Care Physician: Eustaquio Boyden Other Clinician: Referring Physician: Treating Physician/Extender: Priscille Loveless in Treatment: [address] [place], [place] J ([id number]) [id number] Page 6 of 8 Education Provided To: Patient Education Topics Provided Wound/Skin Impairment: Methods: Explain/Verbal Responses: State content correctly Electronic Signature(s) Signed: 09/11/2023 3:58:55 PM By: Redmond Pulling RN, [name] Entered By: Redmond Pulling on 09/11/2023 12:39:08 -------------------------------------------------------------------------------- Wound Assessment Details Patient Name: Date of Service: [name], [name] 09/11/2023 3:15 PM Medical Record Number: [id number] Patient Account Number: [id number] Date of Birth/Sex: Treating RN: [birth year] (77 y.o. [name] Primary Care Maryln Eastham: Eustaquio Boyden Other Clinician: Referring Merrissa Giacobbe: Treating Sarea Fyfe/Extender: Priscille Loveless in Treatment: 58 Wound Status Wound Number: 2 Primary Venous Leg Ulcer Etiology: Wound Location: Left, Posterior Lower Leg Wound Open Wounding Event:  Blister Status: Date Acquired: 07/06/2022 Comorbid Cataracts, Asthma, Hypertension, Neuropathy, Received Weeks Of Treatment: 58 History: Chemotherapy, Received Radiation Clustered Wound: No Photos Wound Measurements Length: (cm) 2.2 Width: (cm) 1.5 Depth: (cm) 0.2 Area: (cm) 2.592 Volume: (cm) 0.518 % Reduction in Area: 62.3% % Reduction in Volume: 24.6% Epithelialization: Small (1-33%) Tunneling: No Undermining: No Wound Description Classification: Full Thickness Without Exposed Suppor Wound Margin: Distinct, outline attached Exudate Amount: Medium Exudate Type: Serosanguineous Exudate Color: red, brown t Structures Foul Odor After Cleansing: No Slough/Fibrino Yes Wound Bed Granulation Amount: Large (67-100%) Exposed Structure Granulation Quality: Red Fascia Exposed: No Necrotic Amount: Small (1-33%) Fat Layer (Subcutaneous Tissue) Exposed: Yes Necrotic Quality: Eschar, Adherent Slough Tendon Exposed: No Muscle Exposed: No [name], [name] ([id number]) [id number] Page 7 of 8 Joint Exposed: No Bone Exposed: No Periwound Skin Texture Texture Color No Abnormalities Noted: Yes No Abnormalities Noted: No Rubor: Yes Moisture No Abnormalities Noted: Yes Temperature / Pain Temperature: No Abnormality Tenderness on Palpation: Yes Treatment Notes Wound #2 (Lower Leg) Wound Laterality: Left, Posterior Cleanser Soap and Water Discharge Instruction: May shower and wash wound with dial antibacterial soap and water prior to dressing change. Peri-Wound Care Ketoconazole Cream 2% Discharge Instruction: Apply Ketoconazole as directed Triamcinolone 15 (g) Discharge Instruction: Use triamcinolone 15 (g) as directed Zinc Oxide Ointment 30g tube Discharge Instruction: Apply Zinc Oxide to periwound with each dressing change Sween Lotion (Moisturizing lotion) Discharge Instruction: Apply moisturizing lotion as directed Topical Gentamicin Discharge  Instruction: As directed by physician Mupirocin Ointment Discharge Instruction: Apply Mupirocin (Bactroban) as instructed Skintegrity Hydrogel 4 (oz) Discharge Instruction: Apply hydrogel as directed Primary Dressing Promogran Prisma Matrix, 4.34 (sq in) (silver collagen) Discharge Instruction: Moisten collagen with saline or hydrogel Secondary Dressing Optifoam Non-Adhesive Dressing, 4x4 in Discharge Instruction: Apply over primary dressing as directed. Zetuvit Plus 4x8 in Discharge Instruction: Apply over primary dressing as directed. Secured With Compression Wrap CoFlex [employer]  4 x 6 (in/yd) Discharge Instruction: Apply Coflex [employer] as directed. Compression Stockings Add-Ons Electronic Signature(s) Signed: 09/11/2023 3:58:55 PM By: Redmond Pulling RN, [name] Entered By: Redmond Pulling on 09/11/2023 12:36:18 -------------------------------------------------------------------------------- Vitals Details Patient Name: Date of Service: [name], DEANDRADE 09/11/2023 3:15 PM [name] ([id number]) [id number] Page 8 of 8 Medical Record Number: [id number] Patient Account Number: [id number] Date of Birth/Sex: Treating RN: [birth year] (77 y.o. [name] Primary Care Rane Blitch: Eustaquio Boyden Other Clinician: Referring Margret Moat: Treating Arsema Tusing/Extender: Priscille Loveless in Treatment: 14 Vital Signs Time Taken: 15:30 Temperature (F): 98.7 Height (in): 63 Pulse (bpm): 78 Weight (lbs): 200 Respiratory Rate (breaths/min): 18 Body Mass Index (BMI): 35.4 Blood Pressure (mmHg): 130/74 Reference Range: 80 - 120 mg / dl Electronic Signature(s) Signed: 09/11/2023 3:58:55 PM By: Redmond Pulling RN, [name] Entered By: Redmond Pulling on 09/11/2023 12:39:20

## 2023-09-12 ENCOUNTER — Encounter: Payer: Self-pay | Admitting: Adult Health

## 2023-09-12 ENCOUNTER — Ambulatory Visit: Payer: Medicare Other | Admitting: Adult Health

## 2023-09-12 VITALS — BP 132/74 | HR 76 | Temp 98.7°F | Ht 60.0 in | Wt 204.0 lb

## 2023-09-12 DIAGNOSIS — Z23 Encounter for immunization: Secondary | ICD-10-CM

## 2023-09-12 DIAGNOSIS — J301 Allergic rhinitis due to pollen: Secondary | ICD-10-CM | POA: Diagnosis not present

## 2023-09-12 DIAGNOSIS — G4733 Obstructive sleep apnea (adult) (pediatric): Secondary | ICD-10-CM

## 2023-09-12 DIAGNOSIS — J4520 Mild intermittent asthma, uncomplicated: Secondary | ICD-10-CM | POA: Diagnosis not present

## 2023-09-12 NOTE — Progress Notes (Signed)
@Patient  ID: [name], female    DOB: [birth year], 76 y.o.   MRN: [id number]  Chief Complaint  Patient presents with   Follow-up    Referring provider: Eustaquio Boyden, MD  HPI: 77 year old female former smoker seen for sleep apnea and asthma  Medical history significant for melanoma, restless leg syndrome, May-Thurner syndrome status post venous stent  TEST/EVENTS :  PSG 2003 which showed AHI of 81 events per hour and lowest desaturation to 57%. This was corrected by CPAP of 12 cm in with residual events of 5.6 events per hour on a study in 2007. CPAP 11/05/13 to 03/24/14 >> used on 140 of 140 nights with average 4 hrs and 36 min. Average AHI is 6.5 with median CPAP 12 cm H2O.    PFTs 04/2014- no airway obs, ratio ok, FEV1 80%, TLC & DLCO nml   09/12/2023 Follow up : Asthma and OSA  Patient returns for follow up visit. Last seen July 2023.  Has mild intermittent asthma.  He says breathing is doing okay.  Denies any flareup of wheezing.  Uses albuterol as needed.  Is on daily Singulair. Uses nasacort and claritin daily   Complains that she is falling asleep prior to putting on her CPAP. Falls asleep reading . Has a very fragmented sleep regimen.  Sleeping at her sisters home while house is being repaired. Sister lives across street. Wakes up and puts it on in the middle of night . Does not go to sleep until Midnight to 1 am.  Download shows 100% compliance with daily average usage at 4.5hr. AHI 1/hr .  Uses nasal pillows . Is on Lyrica At bedtime      Allergies  Allergen Reactions   Sulfa Antibiotics Itching, Swelling and Shortness Of Breath    Facial swelling   Voltaren [Diclofenac Sodium] Shortness Of Breath   Latex Other (See Comments)    Blisters ONLY HAD REACTION TO TAPE  (??? ONLY ADHESIVE ALLERGY)   Tape Other (See Comments)    Caused blisters - must use paper tape - same reaction from NeoPrene   Doxycycline Other (See Comments)    Diaphoresis and dizziness    Gabapentin Other (See Comments)    confusion, sleep a lot, and swelling in her legs   Other     Neoprene   Wound Dressing Adhesive Rash    Immunization History  Administered Date(s) Administered   Fluad Quad(high Dose 65+) 10/14/2019, 08/22/2020, 08/22/2021, 07/13/2022   Influenza, High Dose Seasonal PF 08/13/2016, 07/31/2017, 07/28/2018   Influenza, Seasonal, Injecte, Preservative Fre 09/01/2009   Influenza,inj,Quad PF,6+ Mos 08/06/2013, 09/17/2014, 08/18/2015   Influenza-Unspecified 08/23/2019   Moderna Covid-19 Vaccine Bivalent Booster 56yrs & up 08/08/2021   Moderna Sars-Covid-2 Vaccination 02/16/2020, 03/17/2020, 12/01/2020   Pfizer(Comirnaty)Fall Seasonal Vaccine 12 years and older 09/02/2022   Pneumococcal Conjugate-13 09/22/2014   Pneumococcal Polysaccharide-23 05/15/2013   Respiratory Syncytial Virus Vaccine,Recomb Aduvanted(Arexvy) 11/04/2022   Td 10/14/2019   Tdap 01/26/2022    Past Medical History:  Diagnosis Date   Allergy    Anesthesia complication    trouble waking up 2/2 CPAP   Arthritis    Asthma in adult    Cataract 2019   left eye resolved with surgery   Cervical spondylosis 2012   [place] [facility])   Choroidal nevus of left eye 03/22/2016   Chronic venous insufficiency 2016   severe reflux with painful varicosities sees [facility]   Clotting disorder (HCC)    due to vein ablation  Cognitive dysfunction    Complication of anesthesia    difficulty coming out due to sleep apnea per pt   Difficult intubation    PATIENT DENIES    Diverticulosis    per ct scans 09-2016 and 01-2017   DVT (deep venous thrombosis) (HCC) 2011   small, developed after venous ablation   DVT (deep venous thrombosis) (HCC)    Family history of adverse reaction to anesthesia    O2 levels drop upon waking    Hearing loss sensory, bilateral 2013   mod-severe high freq sensorineural ([facility])   Hiatal hernia     09-2016 ct scan [facility] at cancer center    History of kidney stones  1980s   History of radiation therapy 07/19/11-08/25/2011   RIGHT AXILLARY REGION/METASTATIC   Hypertension    Melanoma (HCC) 06/29/2010   MALIGNANT MELANOMA R SHOULDER/SUPRASCAPULAR BACK s/p interferon chemo and XRT   Neuromuscular disorder (HCC)    muscle spasms   OSA (obstructive sleep apnea)    on CPAP   Pneumonia    2001   RLS (restless legs syndrome)    Seasonal allergies    Sleep apnea    wears cpap   Tinnitus    Unspecified vitamin D deficiency 03/18/2013   Varicose veins of left lower extremity     Tobacco History: Social History   Tobacco Use  Smoking Status Former   Current packs/day: 0.00   Types: Cigarettes   Quit date: 11/05/1965   Years since quitting: 57.8   Passive exposure: Never  Smokeless Tobacco Never  Tobacco Comments   socially as a teen   Counseling given: Not Answered Tobacco comments: socially as a teen   Outpatient Medications Prior to Visit  Medication Sig Dispense Refill   acetaminophen (TYLENOL) 650 MG CR tablet Take 2 tablets (1,300 mg total) by mouth 2 (two) times daily as needed for pain.     albuterol (VENTOLIN HFA) 108 (90 Base) MCG/ACT inhaler INHALE 1-2 PUFFS BY MOUTH EVERY 6 HOURS AS NEEDED FOR WHEEZE OR SHORTNESS OF BREATH 8.5 each 2   aspirin EC 81 MG tablet Take 81 mg by mouth at bedtime. Melanoma prevention     furosemide (LASIX) 40 MG tablet Take 1 tablet (40 mg total) by mouth daily. Take second dose if needed for leg swelling/weight gain 100 tablet 4   loratadine (CLARITIN) 10 MG tablet Take 10 mg by mouth daily.     losartan (COZAAR) 50 MG tablet Take 1 tablet (50 mg total) by mouth daily. 90 tablet 4   montelukast (SINGULAIR) 10 MG tablet Take 1 tablet (10 mg total) by mouth daily. 90 tablet 4   Multiple Vitamin (MULTIVITAMIN WITH MINERALS) TABS tablet Take 1 tablet by mouth daily.     phenol (CHLORASEPTIC GARGLE) 1.4 % LIQD Use as directed 1 spray in the mouth or throat as needed for throat irritation / pain. 118 mL 0    Polyvinyl Alcohol-Povidone (TEARS PLUS OP) Place 1 drop into both eyes daily as needed (dry eyes/ redness/ burning).      potassium chloride SA (KLOR-CON M20) 20 MEQ tablet Take 1 tablet (20 mEq total) by mouth 2 (two) times daily. 180 tablet 4   pregabalin (LYRICA) 25 MG capsule Take 1 capsule (25 mg total) by mouth 2 (two) times daily. 60 capsule 3   PRESCRIPTION MEDICATION CPAP     saccharomyces boulardii (FLORASTOR) 250 MG capsule Take 1 capsule (250 mg total) by mouth 2 (two) times daily.  triamcinolone (NASACORT) 55 MCG/ACT AERO nasal inhaler Place 2 sprays into the nose daily. In each nostril     Vitamin D, Ergocalciferol, (DRISDOL) 1.25 MG (50000 UNIT) CAPS capsule TAKE 1 CAPSULE BY MOUTH ONCE A WEEK 12 capsule 4   No facility-administered medications prior to visit.     Review of Systems:   Constitutional:   No  weight loss, night sweats,  Fevers, chills,+ fatigue, or  lassitude.  HEENT:   No headaches,  Difficulty swallowing,  Tooth/dental problems, or  Sore throat,                No sneezing, itching, ear ache, nasal congestion, post nasal drip,   CV:  No chest pain,  Orthopnea, PNDanasarca, dizziness, palpitations, syncope.   GI  No heartburn, indigestion, abdominal pain, nausea, vomiting, diarrhea, change in bowel habits, loss of appetite, bloody stools.   Resp:    No excess mucus, no productive cough,  No non-productive cough,  No coughing up of blood.  No change in color of mucus.  No wheezing.  No chest wall deformity  Skin: no rash or lesions.  GU: no dysuria, change in color of urine, no urgency or frequency.  No flank pain, no hematuria   MS:  No joint pain or swelling.  No decreased range of motion.  No back pain.    Physical Exam  BP 132/74 (BP Location: Left Arm, Patient Position: Sitting, Cuff Size: Normal)   Pulse 76   Temp 98.7 F (37.1 C) (Oral)   Ht 5' (1.524 m)   Wt 204 lb (92.5 kg)   SpO2 96%   BMI 39.84 kg/m   GEN: A/Ox3; pleasant , NAD,  well nourished    HEENT:  [place]/AT,  EACs-clear, TMs-wnl, NOSE-clear, THROAT-clear, no lesions, no postnasal drip or exudate noted.  Class 3-4 MP airway   NECK:  Supple w/ fair ROM; no JVD; normal carotid impulses w/o bruits; no thyromegaly or nodules palpated; no lymphadenopathy.    RESP  Clear  P & A; w/o, wheezes/ rales/ or rhonchi. no accessory muscle use, no dullness to percussion  CARD:  RRR, no m/r/g, 1+peripheral edema, pulses intact, no cyanosis or clubbing.  GI:   Soft & nt; nml bowel sounds; no organomegaly or masses detected.   Musco: Warm bil, no deformities or joint swelling noted.   Neuro: alert, no focal deficits noted.    Skin: Warm, no lesions or rashes,  bilateral leg wraps    Lab Results:  CBC    Component Value Date/Time   WBC 8.2 08/16/2023 1128   WBC 7.2 10/24/2022 1355   RBC 4.34 08/16/2023 1128   HGB 13.1 08/16/2023 1128   HGB 13.2 08/15/2017 1039   HGB 12.8 10/21/2012 0000   HCT 40.2 08/16/2023 1128   HCT 40.5 08/15/2017 1039   PLT 203 08/16/2023 1128   PLT 196 08/15/2017 1039   MCV 92.6 08/16/2023 1128   MCV 94 08/15/2017 1039   MCH 30.2 08/16/2023 1128   MCHC 32.6 08/16/2023 1128   RDW 12.7 08/16/2023 1128   RDW 13.5 08/15/2017 1039   LYMPHSABS 1.8 08/16/2023 1128   LYMPHSABS 2.0 08/15/2017 1039   MONOABS 0.4 08/16/2023 1128   EOSABS 0.2 08/16/2023 1128   EOSABS 0.2 08/15/2017 1039   BASOSABS 0.0 08/16/2023 1128   BASOSABS 0.0 08/15/2017 1039    BMET    Component Value Date/Time   NA 145 08/16/2023 1128   NA 149 (H) 08/15/2017 1039   NA  143 09/17/2016 1018   K 4.1 08/16/2023 1128   K 4.1 08/15/2017 1039   K 3.5 09/17/2016 1018   CL 104 08/16/2023 1128   CL 107 08/15/2017 1039   CO2 33 (H) 08/16/2023 1128   CO2 29 08/15/2017 1039   CO2 28 09/17/2016 1018   GLUCOSE 122 (H) 08/16/2023 1128   GLUCOSE 107 08/15/2017 1039   BUN 14 08/16/2023 1128   BUN 14 08/15/2017 1039   BUN 19.5 09/17/2016 1018   CREATININE 0.72 08/16/2023  1128   CREATININE 0.79 04/20/2022 1526   CREATININE 0.8 09/17/2016 1018   CALCIUM 9.7 08/16/2023 1128   CALCIUM 9.7 08/15/2017 1039   CALCIUM 9.8 09/17/2016 1018   GFRNONAA >60 08/16/2023 1128   GFRAA >60 05/05/2020 1115    BNP No results found for: "BNP"  ProBNP    Component Value Date/Time   PROBNP 15.0 08/13/2016 0937    Imaging: No results found.  Administration History     None          Latest Ref Rng & Units 04/28/2014    1:04 PM  PFT Results  FVC-Pre L 2.26   FVC-Predicted Pre % 79   FVC-Post L 2.22   FVC-Predicted Post % 78   Pre FEV1/FVC % % 75   Post FEV1/FCV % % 78   FEV1-Pre L 1.70   FEV1-Predicted Pre % 79   FEV1-Post L 1.73   DLCO uncorrected ml/min/mmHg 18.48   DLCO UNC% % 85   DLVA Predicted % 108   TLC L 5.07   TLC % Predicted % 106   RV % Predicted % 136     No results found for: "NITRICOXIDE"      Assessment & Plan:   OSA on CPAP Patient is encouraged on increased daily usage of her CPAP.  Patient is only using for about 4-1/2 hours each day.  Have asked her to try to put her CPAP on as soon as she goes to bed.  I have also asked her to try to improve her sleep hygiene by going to bed earlier and getting in at least 6 or more hours of sleep each night Advised to use caution with sedating medications. CPAP download shows excellent control with AHI at 1.0.  Plan  Patient Instructions  Continue on Singulair daily  Albuterol inhaler As needed .  Nasacort As needed   Claritin daily.  Flu shot today  Try to wear CPAP At bedtime all night long, for at least 6hr or more and with naps.  Follow up with Dr. Vassie Loll  or Timothy Townsel NP in 3 months     Asthma in adult Mild intermittent asthma appears to be well-controlled.  Flu shot today.  Allergic rhinitis Controlled on current regimen.     Rubye Oaks, NP 09/12/2023

## 2023-09-12 NOTE — Assessment & Plan Note (Signed)
Patient is encouraged on increased daily usage of her CPAP.  Patient is only using for about 4-1/2 hours each day.  Have asked her to try to put her CPAP on as soon as she goes to bed.  I have also asked her to try to improve her sleep hygiene by going to bed earlier and getting in at least 6 or more hours of sleep each night Advised to use caution with sedating medications. CPAP download shows excellent control with AHI at 1.0.  Plan  Patient Instructions  Continue on Singulair daily  Albuterol inhaler As needed .  Nasacort As needed   Claritin daily.  Flu shot today  Try to wear CPAP At bedtime all night long, for at least 6hr or more and with naps.  Follow up with Dr. Vassie Loll  or Caelum Federici NP in 3 months

## 2023-09-12 NOTE — Assessment & Plan Note (Signed)
Mild intermittent asthma appears to be well-controlled.  Flu shot today.

## 2023-09-12 NOTE — Addendum Note (Signed)
Addended by: Gay Filler T on: 09/12/2023 04:03 PM   Modules accepted: Orders

## 2023-09-12 NOTE — Patient Instructions (Addendum)
Continue on Singulair daily  Albuterol inhaler As needed .  Nasacort As needed   Claritin daily.  Flu shot today  Try to wear CPAP At bedtime all night long, for at least 6hr or more and with naps.  Follow up with Dr. Vassie Loll  or Zerah Hilyer NP in 3 months

## 2023-09-12 NOTE — Assessment & Plan Note (Signed)
Controlled on current regimen.   

## 2023-09-13 ENCOUNTER — Ambulatory Visit: Payer: Medicare Other | Admitting: Physician Assistant

## 2023-09-16 ENCOUNTER — Ambulatory Visit (INDEPENDENT_AMBULATORY_CARE_PROVIDER_SITE_OTHER): Payer: Medicare Other | Admitting: Family Medicine

## 2023-09-16 ENCOUNTER — Encounter: Payer: Self-pay | Admitting: Family Medicine

## 2023-09-16 VITALS — BP 132/70 | HR 80 | Temp 97.8°F | Ht 60.0 in | Wt 203.1 lb

## 2023-09-16 DIAGNOSIS — G4733 Obstructive sleep apnea (adult) (pediatric): Secondary | ICD-10-CM | POA: Diagnosis not present

## 2023-09-16 DIAGNOSIS — G4700 Insomnia, unspecified: Secondary | ICD-10-CM | POA: Insufficient documentation

## 2023-09-16 NOTE — Patient Instructions (Addendum)
Don't use tea with caffeine.  May try melatonin 5-10mg  at night time May try L-theanine as needed for sleep as well.  Increase nightly # of hours of sleep.  Bedtime routine checklist: 1. Avoid naps during the day 2. Avoid stimulants such as caffeine and nicotine. Avoid bedtime alcohol (it can speed onset of sleep but the body's metabolism can cause awakenings). 3. All forms of exercise help ensure sound sleep - limit vigorous exercise to morning or late afternoon 4. Avoid food too close to bedtime including chocolate (which contains caffeine) 5. Soak up natural light 6. Establish regular bedtime routine. 7. Associate bed with sleep - avoid TV, computer or phone, reading while in bed. 8. Ensure pleasant, relaxing sleep environment - quiet, dark, cool room.

## 2023-09-16 NOTE — Assessment & Plan Note (Addendum)
Discussed situational sleep maintenance insomnia.  May try melatonin 5-10mg  at night as needed for sleep, may try L-theanine PRN nocturnal awakenings. Avoid Rx medication at this time, especially anything that can affect cognition.  Sleep hygiene measures reviewed, checklist provided.

## 2023-09-16 NOTE — Progress Notes (Signed)
Ph: [phone] Fax: [phone]   Patient ID: [name], female    DOB: May 24, 1946, 77 y.o.   MRN: [id number]  This visit was conducted in person.  BP 132/70   Pulse 80   Temp 97.8 F (36.6 C) (Oral)   Ht 5' (1.524 m)   Wt 203 lb 2 oz (92.1 kg)   SpO2 96%   BMI 39.67 kg/m    CC: discuss sleep Subjective:   HPI: [name] is a 77 y.o. female presenting on 09/16/2023 for Medical Management of Chronic Issues (C/o trouble falling asleep and staying asleep. )   Stressful living situation, family situation - currently living with sister, social situation with multiple cats and animal control. BIL has dementia worse after COVID.   Poor sleep for months. Trouble only staying asleep, no trouble falling asleep. Sleep latency 10-67min.  Doesn't have set bedtime routine.  Sleeping in sister's recliner chair, only 4 hours/night.  Has not tried anything for this.  Not interested in medication that could worsen cognition.  Situational insomnia due to above.  She reads book, has tried english breakfast tea to help her fall asleep.   OSA on CPAP - saw pulm last week. Only using 4.5 hours/night - rec increase to 6 hours/night.      Relevant past medical, surgical, family and social history reviewed and updated as indicated. Interim medical history since our last visit reviewed. Allergies and medications reviewed and updated. Outpatient Medications Prior to Visit  Medication Sig Dispense Refill   acetaminophen (TYLENOL) 650 MG CR tablet Take 2 tablets (1,300 mg total) by mouth 2 (two) times daily as needed for pain.     albuterol (VENTOLIN HFA) 108 (90 Base) MCG/ACT inhaler INHALE 1-2 PUFFS BY MOUTH EVERY 6 HOURS AS NEEDED FOR WHEEZE OR SHORTNESS OF BREATH 8.5 each 2   aspirin EC 81 MG tablet Take 81 mg by mouth at bedtime. Melanoma prevention     furosemide (LASIX) 40 MG tablet Take 1 tablet (40 mg total) by mouth daily. Take second dose if needed for leg swelling/weight  gain 100 tablet 4   loratadine (CLARITIN) 10 MG tablet Take 10 mg by mouth daily.     losartan (COZAAR) 50 MG tablet Take 1 tablet (50 mg total) by mouth daily. 90 tablet 4   montelukast (SINGULAIR) 10 MG tablet Take 1 tablet (10 mg total) by mouth daily. 90 tablet 4   Multiple Vitamin (MULTIVITAMIN WITH MINERALS) TABS tablet Take 1 tablet by mouth daily.     phenol (CHLORASEPTIC GARGLE) 1.4 % LIQD Use as directed 1 spray in the mouth or throat as needed for throat irritation / pain. 118 mL 0   Polyvinyl Alcohol-Povidone (TEARS PLUS OP) Place 1 drop into both eyes daily as needed (dry eyes/ redness/ burning).      potassium chloride SA (KLOR-CON M20) 20 MEQ tablet Take 1 tablet (20 mEq total) by mouth 2 (two) times daily. 180 tablet 4   pregabalin (LYRICA) 25 MG capsule Take 1 capsule (25 mg total) by mouth 2 (two) times daily. 60 capsule 3   PRESCRIPTION MEDICATION CPAP     saccharomyces boulardii (FLORASTOR) 250 MG capsule Take 1 capsule (250 mg total) by mouth 2 (two) times daily.     triamcinolone (NASACORT) 55 MCG/ACT AERO nasal inhaler Place 2 sprays into the nose daily. In each nostril     Vitamin D, Ergocalciferol, (DRISDOL) 1.25 MG (50000 UNIT) CAPS capsule TAKE 1 CAPSULE BY MOUTH ONCE  A WEEK 12 capsule 4   No facility-administered medications prior to visit.     Per HPI unless specifically indicated in ROS section below Review of Systems  Objective:  BP 132/70   Pulse 80   Temp 97.8 F (36.6 C) (Oral)   Ht 5' (1.524 m)   Wt 203 lb 2 oz (92.1 kg)   SpO2 96%   BMI 39.67 kg/m   Wt Readings from Last 3 Encounters:  09/16/23 203 lb 2 oz (92.1 kg)  09/12/23 204 lb (92.5 kg)  08/16/23 209 lb (94.8 kg)      Physical Exam Vitals and nursing note reviewed.  Constitutional:      Appearance: Normal appearance. She is not ill-appearing.  Neurological:     Mental Status: She is alert.  Psychiatric:        Mood and Affect: Mood normal.        Behavior: Behavior normal.         Assessment & Plan:   Problem List Items Addressed This Visit     OSA on CPAP    Recently saw pulmonology - recommended increase # hours of use per night. Appreciate pulm care.       Insomnia - Primary    Discussed situational sleep maintenance insomnia.  May try melatonin 5-10mg  at night as needed for sleep, may try L-theanine PRN nocturnal awakenings. Avoid Rx medication at this time, especially anything that can affect cognition.  Sleep hygiene measures reviewed, checklist provided.         No orders of the defined types were placed in this encounter.   No orders of the defined types were placed in this encounter.   Patient Instructions  Don't use tea with caffeine.  May try melatonin 5-10mg  at night time May try L-theanine as needed for sleep as well.  Increase nightly # of hours of sleep.  Bedtime routine checklist: 1. Avoid naps during the day 2. Avoid stimulants such as caffeine and nicotine. Avoid bedtime alcohol (it can speed onset of sleep but the body's metabolism can cause awakenings). 3. All forms of exercise help ensure sound sleep - limit vigorous exercise to morning or late afternoon 4. Avoid food too close to bedtime including chocolate (which contains caffeine) 5. Soak up natural light 6. Establish regular bedtime routine. 7. Associate bed with sleep - avoid TV, computer or phone, reading while in bed. 8. Ensure pleasant, relaxing sleep environment - quiet, dark, cool room.   Follow up plan: Return if symptoms worsen or fail to improve.  Eustaquio Boyden, MD

## 2023-09-16 NOTE — Assessment & Plan Note (Signed)
Recently saw pulmonology - recommended increase # hours of use per night. Appreciate pulm care.

## 2023-09-17 ENCOUNTER — Encounter (HOSPITAL_BASED_OUTPATIENT_CLINIC_OR_DEPARTMENT_OTHER): Payer: Medicare Other | Admitting: General Surgery

## 2023-09-17 DIAGNOSIS — L97822 Non-pressure chronic ulcer of other part of left lower leg with fat layer exposed: Secondary | ICD-10-CM | POA: Diagnosis not present

## 2023-09-17 DIAGNOSIS — I890 Lymphedema, not elsewhere classified: Secondary | ICD-10-CM | POA: Diagnosis not present

## 2023-09-17 DIAGNOSIS — I10 Essential (primary) hypertension: Secondary | ICD-10-CM | POA: Diagnosis not present

## 2023-09-17 DIAGNOSIS — G4733 Obstructive sleep apnea (adult) (pediatric): Secondary | ICD-10-CM | POA: Diagnosis not present

## 2023-09-17 DIAGNOSIS — L97818 Non-pressure chronic ulcer of other part of right lower leg with other specified severity: Secondary | ICD-10-CM | POA: Diagnosis not present

## 2023-09-17 DIAGNOSIS — G629 Polyneuropathy, unspecified: Secondary | ICD-10-CM | POA: Diagnosis not present

## 2023-09-17 DIAGNOSIS — I872 Venous insufficiency (chronic) (peripheral): Secondary | ICD-10-CM | POA: Diagnosis not present

## 2023-09-17 DIAGNOSIS — L97222 Non-pressure chronic ulcer of left calf with fat layer exposed: Secondary | ICD-10-CM | POA: Diagnosis not present

## 2023-09-17 NOTE — Progress Notes (Signed)
[name] ([id number]) [id number] Page 1 of 13 Visit Report for 09/17/2023 Chief Complaint Document Details Patient Name: Date of Service: [name], [name] 09/17/2023 1:45 PM Medical Record Number: [id number] Patient Account Number: [id number] Date of Birth/Sex: Treating RN: 08-03-1946 (77 y.o. F) Primary Care Provider: Eustaquio Boyden Other Clinician: Referring Provider: Treating Provider/Extender: Priscille Loveless in Treatment: 8 Information Obtained from: Patient Chief Complaint Patient presents for treatment of an open ulcer due to venous insufficiency Electronic Signature(s) Signed: 09/17/2023 2:23:45 PM By: Duanne Guess MD FACS Entered By: Duanne Guess on 09/17/2023 11:23:45 -------------------------------------------------------------------------------- Debridement Details Patient Name: Date of Service: [name], [name] 09/17/2023 1:45 PM Medical Record Number: [id number] Patient Account Number: [id number] Date of Birth/Sex: Treating RN: 1945/12/21 (77 y.o. [name] Primary Care Provider: Eustaquio Boyden Other Clinician: Referring Provider: Treating Provider/Extender: Priscille Loveless in Treatment: 59 Debridement Performed for Assessment: Wound #2 Left,Posterior Lower Leg Performed By: Physician Duanne Guess, MD The following information was scribed by: Karie Schwalbe The information was scribed for: Duanne Guess Debridement Type: Debridement Severity of Tissue Pre Debridement: Fat layer exposed Level of Consciousness (Pre-procedure): Awake and Alert Pre-procedure Verification/Time Out Yes - 14:19 Taken: Start Time: 14:19 Pain Control: Lidocaine 4% T opical Solution Percent of Wound Bed Debrided: 100% T Area Debrided (cm): otal 4.67 Tissue and other material debrided: Viable, Non-Viable, Slough, Subcutaneous, Slough Level: Skin/Subcutaneous  Tissue Debridement Description: Excisional Instrument: Curette Bleeding: Minimum Hemostasis Achieved: Pressure Response to Treatment: Procedure was tolerated well Level of Consciousness (Post- Awake and Alert procedure): Post Debridement Measurements of Total Wound Length: (cm) 3.5 Width: (cm) 1.7 Depth: (cm) 0.2 Volume: (cm) 0.935 Character of Wound/Ulcer Post Debridement: Improved Severity of Tissue Post Debridement: Fat layer exposed [name], [name] ([id number]) [id number] Page 2 of 13 Post Procedure Diagnosis Same as Pre-procedure Electronic Signature(s) Signed: 09/17/2023 2:54:12 PM By: Duanne Guess MD FACS Signed: 09/17/2023 4:53:02 PM By: Karie Schwalbe RN Entered By: Karie Schwalbe on 09/17/2023 11:22:29 -------------------------------------------------------------------------------- HPI Details Patient Name: Date of Service: [name] 09/17/2023 1:45 PM Medical Record Number: [id number] Patient Account Number: [id number] Date of Birth/Sex: Treating RN: 1946/05/11 (77 y.o. F) Primary Care Provider: Eustaquio Boyden Other Clinician: Referring Provider: Treating Provider/Extender: Priscille Loveless in Treatment: 20 History of Present Illness HPI Description: ADMISSION This is a 77 year old woman with a history of chronic venous insufficiency status post saphenous vein ablations in 2010 and 2016. 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 She presents today with an open venous ulcer on her left lower leg. It has been present for a little over 2 weeks. She saw her primary care provider who apparently swabbed the wound and grew out Pseudomonas. She just completed a course of ciprofloxacin for this. ABI was 0.91. She reports that she has had previous issues with ulcers in this same location, stemming back to a punch biopsy taken by a dermatologist many years ago. She has had several skin  substitutes applied to the area that have ultimately resulted in healing on prior occasions. She has 2 small ulcers on her left medial lower leg. There is slough accumulation in both of them. The more anterior of the 2 is quite small and has some soft slough on the surface, underneath which there is good granulation tissue. The more medial wound also has slough accumulation, but the underlying surface is fairly fibrotic and gritty. This is consistent with her provided history of multiple ulcers in the same  location. 08/03/2022: The anterior wound is smaller today with just a little bit of slough accumulation. The more medial wound continues to be very sensitive and fibrotic with slough buildup. 08/13/2022: The anterior wound is closed. The more medial wound remains sensitive with a fairly fibrotic surface. There is more granulation tissue filling in, however, and there is less slough than on prior occasions. 08/20/2022: The anterior wound remains closed. The more medial wound has filled with granulation tissue but still has a fair amount of slough on the surface and remains fairly tender. Unfortunately, she has developed 2 small ulcers just proximal to this. The fat layer is exposed in each. She says that over the weekend, she felt a burning sensation in that location. 08/27/2022: The 2 small wounds proximal to the main wound have merged into a single site. The wounds look a little bit dry, but they are quite clean without significant slough accumulation. They remain quite tender. 09/03/2022: All of the wounds have now merged into 1 large wound. There is a strong odor coming from the wound and the surface is not particularly viable. It is extremely painful for her today. 09/10/2022: The wound is less black and purple this week but still does not look particularly viable. The surface is desiccated. The culture that I took last week returned with a polymicrobial population including Pseudomonas. I  prescribed both Augmentin and levofloxacin. She has not yet picked up levofloxacin, as her pharmacy only notified her yesterday that it was ready. We have ordered a [facility] topical compounded antibiotic, but it has not yet come. 09/17/2022: The wound surface is markedly improved today. There is still an area of grayish-looking muscle but the rest appears significantly more viable. There is still a layer of slough on the surface. She still has a couple days left of oral antibiotic therapy. She has her Jodie Echevaria compounded topical antibiotic with her today. 09/24/2022: She has completed her oral antibiotic therapy. The wound surface is much cleaner today and more viable without any necrotic tissue. It is a bit desiccated, however. 10/01/2022: The moisture balance of the wound has improved. There is a layer of yellow slough on the surface, but beneath this, the wound is more pink. 10/08/2022: The wound is smaller and cleaner today with a layer of slough present. She is having less pain. 12/18; this patient has a new wound on the right lateral ankle which apparently started after a cat scratched her. Secondarily she managed to drop a cake pan on the same area. This is very painful. The original wound was on the left posterior calf we have been using [employer] under an [employer]. The Unna boot fell down and apparently she was in for a nurse visit perhaps last week and that 1 fell down as well This patient has central venous mediated hypertension. For member correctly she has a stent placed in the left common iliac artery by Dr. Randie Heinz some years ago [facility] Thurner syndrome] she also has had venous ablations and I believe a history of a DVT The patient has compression pumps at home but she does not use them 10/30/2022: The patient says that her wounds burned all week with the [facility] classic in place. She has a fair amount of slough and nonviable subcutaneous tissue present on the right ankle. The  left posterior calf wound is cleaner than I have seen it to date. Both remain fairly tender. MYCHAL, PATCHETT ([id number]) [id number] Page 3 of 13 11/14/2022: Both wounds have  substantial slough and eschar accumulation. They remain extremely tender. 11/21/2022: The wounds are cleaner this week, but still have slough and eschar accumulation. She continues to describe burning pain in both wounds, but upon further questioning, she actually has burning in both of her feet that radiates up her legs and includes to the wounds. 11/28/2022: Both wounds have slough and eschar accumulation, as well as adherent silver alginate that was difficult to remove. She continues to have pain out of proportion to the extent of her wounds. Her PCP did initiate Lyrica which she started taking last night. The dose is quite low, only 25 mg, but apparently there is a plan for upward titration, assuming she tolerates the medication. 12/05/2022: The wounds are both a little bit smaller today, but have significant slough accumulation, as usual. They remain exquisitely tender. She is now taking Lyrica twice a day. 12/19/2022: Both of her wounds look significantly better this week. There is slough buildup on both, but they are smaller. She seems to be getting a good response from Lyrica as she is having much less pain. 12/26/2022: The wound on her right lateral ankle is nearly closed. The wound on her left posterior calf is smaller but still has a lot of slough accumulation. They remain tender. 01/02/2023: The right lateral ankle wound is down to just a couple of millimeters. It is much less tender than on prior occasions. There is minimal slough buildup. The wound on her posterior calf continues to contract, as well. It has thick slough buildup and remains fairly tender. 01/09/2023: The right lateral ankle wound is nearly closed. He remains tender. There is just a little eschar on the surface. The wound on her  posterior calf has improved quite a bit. There is still slough on the surface, but the more proximal area has epithelialized substantially and there is no longer any gray discoloration to her tissue. 3/13; patient presents for follow-up. Her right lateral ankle wound is almost healed. She has a wound to her left posterior calf with slough and granulation tissue. We have been using Santyl under Unna boots bilaterally to the lower extremities. She has no issues or complaints today. 01/30/2023: The right lateral ankle wound is down to just a pinhole under a layer of eschar. The left posterior calf wound is quite a bit improved since the last time I saw it. There is still a layer of slough on the surface but there has been more epithelialization. 02/07/2023: The right lateral ankle wound still has some eschar on the surface. The left posterior calf wound is about the same size, but with more areas of epithelialization. 02/20/2023: The right lateral ankle wound is healed. The left posterior calf wound is smaller and is essentially flush with the surrounding skin, but the surface is quite dry. Her leg wrap slipped and her calf is quite swollen above the level of the wound. 03/01/2023: The right lateral ankle wound reopened and she also suffered a fall that resulted in a small wound on her right anterior tibial surface. The left posterior calf wound has a much better moisture balance today. There is a layer of slough on the surface. The [employer] worked much better for her as far as compression this week. 03/06/2023: The right lateral ankle wound has a layer of eschar on the surface and is too painful for me to debride. The anterior tibial surface wound is more or less healed; it is a very superficial abrasion at this point. The left posterior  calf wound has a layer of slough on the surface. Her edema control is better. 03/13/2023: The right lateral ankle wound has a layer of eschar on the surface. Once this was  debrided, it was found to be healed. The anterior tibial surface wound has a layer of stuck silver alginate on the surface. This was removed to reveal a very superficial abrasion but not much change from last week. The left posterior calf wound is smaller and has its usual layer of accumulated slough. 03/25/2023: The anterior tibial surface wound has eschar and silver alginate stuck to it. Once this was removed, the wound was found to be healed. The left posterior calf wound is roughly the same size and has a little bit thicker layer of accumulated slough. 04/03/2023: She has had more drainage from the posterior calf wound over the last week and the layer of slough is thicker. She also says it is more tender. 04/10/2023: Continued increase in drainage from her wound. The wound remains tender with a layer of slough on the surface. It does not appear as viable this week. She also has erythema, swelling, and warmth in her entire left lower leg extending into the midfoot. 04/17/2023: The wound is about the same size this week. There is fibrotic adherent slough, but the drainage is not as profuse. She is currently taking levofloxacin. She is complaining of an increase in the burning sensation at her wound. 04/24/2023: The wound measured smaller this week and is much cleaner-looking. She completed her course of levofloxacin. Her PCP increased her dose of Lyrica and she reports that the burning has stopped. 05/01/2023: The wound is smaller again today and quite clean with only minimal slough on the surface. 05/08/2023: The wound is smaller again today. It is fairly clean, but the surface is a bit fibrotic. Moisture balance is better. Her leg is more swollen and erythematous today, however. 05/15/2023: The wound is about the same size today. The surface has very little slough accumulation, but remains fibrotic. Moisture balance is good. She is responding nicely to the oral Keflex she is taking and her leg is less  erythematous and edematous. 05/22/2023: The wound is unchanged in size. There is quite a bit of slough on the surface but the moisture balance is better. It is still fibrotic, but a little bit less so. Her leg is no longer erythematous, but she does have some persistent edema. 05/29/2023: The wound measured a little bit smaller today. It is dry again and the surface is quite fibrotic. The edema in her leg has improved. 06/12/2023: The wound measures slightly larger today. The moisture balance has improved markedly. There is soft slough on the wound surface. Edema control is improved. 06/19/2023: The wound measured slightly smaller today. There is a band of epithelium dividing the most proximal portion of the wound from the remainder. There is slough accumulation and moisture balance is improved. 06/26/2023: The band of epithelium between the 2 open areas has gotten wider. There is some slough accumulation on the surfaces along with a little bit of eschar. Moisture balance continues to be good. 07/03/2023: The band of epithelium continues to expand. The moisture balance remains good and the surface, particularly of the more distal aspect of the wound continues to improve. Slough and eschar accumulation, as usual. 07/10/2023: The wound measured smaller today. Moisture balance is good and the tissue underneath the layer of slough that has accumulated continues to improve. 07/17/2023: The wound is smaller again today. Moisture balance is  appropriate. There is a little bit of slough overlying granulation tissue. [name], [name] ([id number]) [id number] Page 4 of 13 07/24/2023: The band of epithelium between the 2 open areas has gotten wider. The more proximal area has more epithelialization around the perimeter. Both have slough on the surface and remain fairly fibrotic. Moisture balance remains good. 08/07/2023: Both open areas are little bit smaller today. The band of epithelium between  the 2 continues to enlarge. There is slough and eschar on both surfaces. 08/14/2023: No significant change to the wound dimensions. The surface is rather dry and fibrotic again. 08/21/2023: The wound is smaller and the skin bridge between the 2 open areas has gotten wider. There is slough accumulation on the surfaces and the moisture balance is improved. 08/28/2023: Both open areas of the wound are smaller today. There is slough on the surface, but it is quite soft. Moisture balance is good. 09/04/2023: The band of epithelium between the 2 open areas continues to widen. The wound is very sensitive today, more so than usual. No concern for infection based upon visual inspection, however. There is some slough on the wound surface, which remains fairly fibrotic. 09/11/2023: Both open areas are little bit smaller today. There is slough accumulation on the surface. The underlying tissue is still somewhat fibrotic, but the color is good. Edema control is good. 09/17/2023: The wound continues to improve. The open areas are smaller and the band of skin has gotten wider. The tissue color is possibly the best that I have ever seen it although it does remain somewhat fibrotic. There is slough on the wound surface. Electronic Signature(s) Signed: 09/17/2023 2:24:29 PM By: Duanne Guess MD FACS Entered By: Duanne Guess on 09/17/2023 11:24:29 -------------------------------------------------------------------------------- Physical Exam Details Patient Name: Date of Service: [name], [name] 09/17/2023 1:45 PM Medical Record Number: [id number] Patient Account Number: [id number] Date of Birth/Sex: Treating RN: 1946/09/19 (77 y.o. F) Primary Care Provider: Eustaquio Boyden Other Clinician: Referring Provider: Treating Provider/Extender: Priscille Loveless in Treatment: 48 Constitutional Slightly hypertensive. . . . no acute distress. Respiratory Normal work of breathing on room  air.. Notes 09/17/2023: The wound continues to improve. The open areas are smaller and the band of skin has gotten wider. The tissue color is possibly the best that I have ever seen it although it does remain somewhat fibrotic. There is slough on the wound surface. Electronic Signature(s) Signed: 09/17/2023 2:24:58 PM By: Duanne Guess MD FACS Entered By: Duanne Guess on 09/17/2023 11:24:58 -------------------------------------------------------------------------------- Physician Orders Details Patient Name: Date of Service: [name], [name] 09/17/2023 1:45 PM Medical Record Number: [id number] Patient Account Number: [id number] Date of Birth/Sex: Treating RN: 1946-01-19 (77 y.o. [name] Primary Care Provider: Eustaquio Boyden Other Clinician: Referring Provider: Treating Provider/Extender: Priscille Loveless in Treatment: 36 Verbal / Phone Orders: No Diagnosis [address] [place], [place] ([id number]) [id number] Page 5 of 13 ICD-10 Coding Code Description [phone] Non-pressure chronic ulcer of other part of left lower leg with fat layer exposed I83.893 Varicose veins of bilateral lower extremities with other complications I87.2 Venous insufficiency (chronic) (peripheral) G62.9 Polyneuropathy, unspecified R60.0 Localized edema Follow-up Appointments ppointment in 1 week. - Dr. Lady Gary Room 3 09/25/23 at 2:30pm Return A ppointment in 2 weeks. - Please ask Front desk for an appointment Return A Return appointment in 3 weeks. - Week of Thanksgiving-please schedule a nurse visit Anesthetic Wound #2 Left,Posterior Lower Leg (In clinic) Topical Lidocaine 4% applied to wound bed Bathing/ Shower/ Hygiene May shower  with protection but do not get wound dressing(s) wet. Protect dressing(s) with water repellant cover (for example, large plastic bag) or a cast cover and may then take shower. Edema Control - Orders /  Instructions Other Edema Control Orders/Instructions: - Right Lymphadema non-wound Coflex w/Calamine Additional Orders / Instructions Other: - Recommend Nail Care Wound Treatment Wound #2 - Lower Leg Wound Laterality: Left, Posterior Cleanser: Soap and Water 1 x Per Week/30 Days Discharge Instructions: May shower and wash wound with dial antibacterial soap and water prior to dressing change. Peri-Wound Care: Ketoconazole Cream 2% 1 x Per Week/30 Days Discharge Instructions: Apply Ketoconazole as directed Peri-Wound Care: Triamcinolone 15 (g) 1 x Per Week/30 Days Discharge Instructions: Use triamcinolone 15 (g) as directed Peri-Wound Care: Zinc Oxide Ointment 30g tube 1 x Per Week/30 Days Discharge Instructions: Apply Zinc Oxide to periwound with each dressing change Peri-Wound Care: Sween Lotion (Moisturizing lotion) 1 x Per Week/30 Days Discharge Instructions: Apply moisturizing lotion as directed Topical: Gentamicin 1 x Per Week/30 Days Discharge Instructions: As directed by physician Topical: Mupirocin Ointment 1 x Per Week/30 Days Discharge Instructions: Apply Mupirocin (Bactroban) as instructed Topical: Skintegrity Hydrogel 4 (oz) 1 x Per Week/30 Days Discharge Instructions: Apply hydrogel as directed Prim Dressing: Promogran Prisma Matrix, 4.34 (sq in) (silver collagen) 1 x Per Week/30 Days ary Discharge Instructions: Moisten collagen with saline or hydrogel Secondary Dressing: Optifoam Non-Adhesive Dressing, 4x4 in 1 x Per Week/30 Days Discharge Instructions: Apply over primary dressing as directed. Secondary Dressing: Zetuvit Plus 4x8 in 1 x Per Week/30 Days Discharge Instructions: Apply over primary dressing as directed. Compression Wrap: CoFlex Calamine Unna Boot 4 x 6 (in/yd) 1 x Per Week/30 Days Discharge Instructions: Apply Coflex Calamine [employer] as directed. Electronic Signature(s) Signed: 09/17/2023 2:54:12 PM By: Duanne Guess MD FACS Signed: 09/17/2023 4:53:02  PM By: Karie Schwalbe RN Previous Signature: 09/17/2023 2:25:10 PM Version By: Duanne Guess MD FACS Entered By: Karie Schwalbe on 09/17/2023 11:25:53 [name], [name] ([id number]) [id number] Page 6 of 13 -------------------------------------------------------------------------------- Problem List Details Patient Name: Date of Service: LAKELA, HELMLINGER 09/17/2023 1:45 PM Medical Record Number: [id number] Patient Account Number: [id number] Date of Birth/Sex: Treating RN: 02/01/1946 (77 y.o. F) Primary Care Provider: Eustaquio Boyden Other Clinician: Referring Provider: Treating Provider/Extender: Priscille Loveless in Treatment: 73 Active Problems ICD-10 Encounter Code Description Active Date MDM Diagnosis [phone] Non-pressure chronic ulcer of other part of left lower leg with fat layer exposed 07/27/2022 No Yes I83.893 Varicose veins of bilateral lower extremities with other complications 07/27/2022 No Yes I87.2 Venous insufficiency (chronic) (peripheral) 07/27/2022 No Yes G62.9 Polyneuropathy, unspecified 07/27/2022 No Yes R60.0 Localized edema 07/27/2022 No Yes Inactive Problems ICD-10 Code Description Active Date Inactive Date L03.115 Cellulitis of right lower limb 10/22/2022 10/22/2022 L97.818 Non-pressure chronic ulcer of other part of right lower leg with other specified severity 03/01/2023 10/22/2022 Resolved Problems Electronic Signature(s) Signed: 09/17/2023 2:23:26 PM By: Duanne Guess MD FACS Entered By: Duanne Guess on 09/17/2023 11:23:26 -------------------------------------------------------------------------------- Progress Note Details Patient Name: Date of Service: [name], [name] 09/17/2023 1:45 PM Medical Record Number: [id number] Patient Account Number: [id number] Date of Birth/Sex: Treating RN: 12-01-1945 (77 y.o. F) Primary Care Provider: Eustaquio Boyden Other Clinician: Referring Provider: Treating  Provider/Extender: Priscille Loveless in Treatment: [address] [place] ([id number]) [id number] Page 7 of 13 Subjective Chief Complaint Information obtained from Patient Patient presents for treatment of an open ulcer due to venous insufficiency History of Present Illness (HPI) ADMISSION This is a  77 year old woman with a history of chronic venous insufficiency status post saphenous vein ablations in 2010 and 2016. 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 She presents today with an open venous ulcer on her left lower leg. It has been present for a little over 2 weeks. She saw her primary care provider who apparently swabbed the wound and grew out Pseudomonas. She just completed a course of ciprofloxacin for this. ABI was 0.91. She reports that she has had previous issues with ulcers in this same location, stemming back to a punch biopsy taken by a dermatologist many years ago. She has had several skin substitutes applied to the area that have ultimately resulted in healing on prior occasions. She has 2 small ulcers on her left medial lower leg. There is slough accumulation in both of them. The more anterior of the 2 is quite small and has some soft slough on the surface, underneath which there is good granulation tissue. The more medial wound also has slough accumulation, but the underlying surface is fairly fibrotic and gritty. This is consistent with her provided history of multiple ulcers in the same location. 08/03/2022: The anterior wound is smaller today with just a little bit of slough accumulation. The more medial wound continues to be very sensitive and fibrotic with slough buildup. 08/13/2022: The anterior wound is closed. The more medial wound remains sensitive with a fairly fibrotic surface. There is more granulation tissue filling in, however, and there is less slough than on prior occasions. 08/20/2022: The  anterior wound remains closed. The more medial wound has filled with granulation tissue but still has a fair amount of slough on the surface and remains fairly tender. Unfortunately, she has developed 2 small ulcers just proximal to this. The fat layer is exposed in each. She says that over the weekend, she felt a burning sensation in that location. 08/27/2022: The 2 small wounds proximal to the main wound have merged into a single site. The wounds look a little bit dry, but they are quite clean without significant slough accumulation. They remain quite tender. 09/03/2022: All of the wounds have now merged into 1 large wound. There is a strong odor coming from the wound and the surface is not particularly viable. It is extremely painful for her today. 09/10/2022: The wound is less black and purple this week but still does not look particularly viable. The surface is desiccated. The culture that I took last week returned with a polymicrobial population including Pseudomonas. I prescribed both Augmentin and levofloxacin. She has not yet picked up levofloxacin, as her pharmacy only notified her yesterday that it was ready. We have ordered a [employer] topical compounded antibiotic, but it has not yet come. 09/17/2022: The wound surface is markedly improved today. There is still an area of grayish-looking muscle but the rest appears significantly more viable. There is still a layer of slough on the surface. She still has a couple days left of oral antibiotic therapy. She has her Jodie Echevaria compounded topical antibiotic with her today. 09/24/2022: She has completed her oral antibiotic therapy. The wound surface is much cleaner today and more viable without any necrotic tissue. It is a bit desiccated, however. 10/01/2022: The moisture balance of the wound has improved. There is a layer of yellow slough on the surface, but beneath this, the wound is more pink. 10/08/2022: The wound is smaller and cleaner today  with a layer of slough present. She is having less pain. 12/18; this  patient has a new wound on the right lateral ankle which apparently started after a cat scratched her. Secondarily she managed to drop a cake pan on the same area. This is very painful. The original wound was on the left posterior calf we have been using [employer] under an [employer]. The Unna boot fell down and apparently she was in for a nurse visit perhaps last week and that 1 fell down as well This patient has central venous mediated hypertension. For member correctly she has a stent placed in the left common iliac artery by Dr. 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 She has a fair amount of slough and nonviable subcutaneous tissue present on the right ankle. The left posterior calf wound is cleaner than I have seen it to date. Both remain fairly tender. 11/14/2022: Both wounds have substantial slough and eschar accumulation. They remain extremely tender. 11/21/2022: The wounds are cleaner this week, but still have slough and eschar accumulation. She continues to describe burning pain in both wounds, but upon further questioning, she actually has burning in both of her feet that radiates up her legs and includes to the wounds. 11/28/2022: Both wounds have slough and eschar accumulation, as well as adherent silver alginate that was difficult to remove. She continues to have pain out of proportion to the extent of her wounds. Her PCP did initiate Lyrica which she started taking last night. The dose is quite low, only 25 mg, but apparently there is a plan for upward titration, assuming she tolerates the medication. 12/05/2022: The wounds are both a little bit smaller today, but have  significant slough accumulation, as usual. They remain exquisitely tender. She is now taking Lyrica twice a day. 12/19/2022: Both of her wounds look significantly better this week. There is slough buildup on both, but they are smaller. She seems to be getting a good response from Lyrica as she is having much less pain. 12/26/2022: The wound on her right lateral ankle is nearly closed. The wound on her left posterior calf is smaller but still has a lot of slough accumulation. They remain tender. 01/02/2023: The right lateral ankle wound is down to just a couple of millimeters. It is much less tender than on prior occasions. There is minimal slough buildup. The wound on her posterior calf continues to contract, as well. It has thick slough buildup and remains fairly tender. 01/09/2023: The right lateral ankle wound is nearly closed. He remains tender. There is just a little eschar on the surface. The wound on her posterior calf has improved quite a bit. There is still slough on the surface, but the more proximal area has epithelialized substantially and there is no longer any gray discoloration to her tissue. 3/13; patient presents for follow-up. Her right lateral ankle wound is almost healed. She has a wound to her left posterior calf with slough and granulation tissue. [name], [name] ([id number]) [id number] Page 8 of 13 We have been using Santyl under Unna boots bilaterally to the lower extremities. She has no issues or complaints today. 01/30/2023: The right lateral ankle wound is down to just a pinhole under a layer of eschar. The left posterior calf wound is quite a bit improved since the last  time I saw it. There is still a layer of slough on the surface but there has been more epithelialization. 02/07/2023: The right lateral ankle wound still has some eschar on the surface. The left posterior calf wound is about the same size, but with more areas  of epithelialization. 02/20/2023: The right lateral ankle wound is healed. The left posterior calf wound is smaller and is essentially flush with the surrounding skin, but the surface is quite dry. Her leg wrap slipped and her calf is quite swollen above the level of the wound. 03/01/2023: The right lateral ankle wound reopened and she also suffered a fall that resulted in a small wound on her right anterior tibial surface. The left posterior calf wound has a much better moisture balance today. There is a layer of slough on the surface. The [employer] worked much better for her as far as compression this week. 03/06/2023: The right lateral ankle wound has a layer of eschar on the surface and is too painful for me to debride. The anterior tibial surface wound is more or less healed; it is a very superficial abrasion at this point. The left posterior calf wound has a layer of slough on the surface. Her edema control is better. 03/13/2023: The right lateral ankle wound has a layer of eschar on the surface. Once this was debrided, it was found to be healed. The anterior tibial surface wound has a layer of stuck silver alginate on the surface. This was removed to reveal a very superficial abrasion but not much change from last week. The left posterior calf wound is smaller and has its usual layer of accumulated slough. 03/25/2023: The anterior tibial surface wound has eschar and silver alginate stuck to it. Once this was removed, the wound was found to be healed. The left posterior calf wound is roughly the same size and has a little bit thicker layer of accumulated slough. 04/03/2023: She has had more drainage from the posterior calf wound over the last week and the layer of slough is thicker. She also says it is more tender. 04/10/2023: Continued increase in drainage from her wound. The wound remains tender with a layer of slough on the surface. It does not appear as viable this week. She also has erythema,  swelling, and warmth in her entire left lower leg extending into the midfoot. 04/17/2023: The wound is about the same size this week. There is fibrotic adherent slough, but the drainage is not as profuse. She is currently taking levofloxacin. She is complaining of an increase in the burning sensation at her wound. 04/24/2023: The wound measured smaller this week and is much cleaner-looking. She completed her course of levofloxacin. Her PCP increased her dose of Lyrica and she reports that the burning has stopped. 05/01/2023: The wound is smaller again today and quite clean with only minimal slough on the surface. 05/08/2023: The wound is smaller again today. It is fairly clean, but the surface is a bit fibrotic. Moisture balance is better. Her leg is more swollen and erythematous today, however. 05/15/2023: The wound is about the same size today. The surface has very little slough accumulation, but remains fibrotic. Moisture balance is good. She is responding nicely to the oral Keflex she is taking and her leg is less erythematous and edematous. 05/22/2023: The wound is unchanged in size. There is quite a bit of slough on the surface but the moisture balance is better. It is still fibrotic, but a little bit less so. Her  leg is no longer erythematous, but she does have some persistent edema. 05/29/2023: The wound measured a little bit smaller today. It is dry again and the surface is quite fibrotic. The edema in her leg has improved. 06/12/2023: The wound measures slightly larger today. The moisture balance has improved markedly. There is soft slough on the wound surface. Edema control is improved. 06/19/2023: The wound measured slightly smaller today. There is a band of epithelium dividing the most proximal portion of the wound from the remainder. There is slough accumulation and moisture balance is improved. 06/26/2023: The band of epithelium between the 2 open areas has gotten wider. There is some slough  accumulation on the surfaces along with a little bit of eschar. Moisture balance continues to be good. 07/03/2023: The band of epithelium continues to expand. The moisture balance remains good and the surface, particularly of the more distal aspect of the wound continues to improve. Slough and eschar accumulation, as usual. 07/10/2023: The wound measured smaller today. Moisture balance is good and the tissue underneath the layer of slough that has accumulated continues to improve. 07/17/2023: The wound is smaller again today. Moisture balance is appropriate. There is a little bit of slough overlying granulation tissue. 07/24/2023: The band of epithelium between the 2 open areas has gotten wider. The more proximal area has more epithelialization around the perimeter. Both have slough on the surface and remain fairly fibrotic. Moisture balance remains good. 08/07/2023: Both open areas are little bit smaller today. The band of epithelium between the 2 continues to enlarge. There is slough and eschar on both surfaces. 08/14/2023: No significant change to the wound dimensions. The surface is rather dry and fibrotic again. 08/21/2023: The wound is smaller and the skin bridge between the 2 open areas has gotten wider. There is slough accumulation on the surfaces and the moisture balance is improved. 08/28/2023: Both open areas of the wound are smaller today. There is slough on the surface, but it is quite soft. Moisture balance is good. 09/04/2023: The band of epithelium between the 2 open areas continues to widen. The wound is very sensitive today, more so than usual. No concern for infection based upon visual inspection, however. There is some slough on the wound surface, which remains fairly fibrotic. 09/11/2023: Both open areas are little bit smaller today. There is slough accumulation on the surface. The underlying tissue is still somewhat fibrotic, but the color is good. Edema control is good. 09/17/2023:  The wound continues to improve. The open areas are smaller and the band of skin has gotten wider. The tissue color is possibly the best that I have ever seen it although it does remain somewhat fibrotic. There is slough on the wound surface. Patient History Information obtained from Patient. Family History [name], [name] ([id number]) [id number] Page 9 of 13 Cancer - Mother,Paternal Grandparents, Diabetes - Siblings, Heart Disease - Father, Hypertension - Father. Social History Former smoker - quit in 1967. Medical History Eyes Patient has history of Cataracts - L eye Ear/Nose/Mouth/Throat Denies history of Chronic sinus problems/congestion, Middle ear problems Hematologic/Lymphatic Denies history of Anemia, Human Immunodeficiency Virus, Lymphedema Respiratory Patient has history of Asthma Cardiovascular Patient has history of Hypertension Endocrine Denies history of Type I Diabetes, Type II Diabetes Neurologic Patient has history of Neuropathy - Feet and finger Denies history of Seizure Disorder Oncologic Patient has history of Received Chemotherapy - 2012, Received Radiation - 2012 Hospitalization/Surgery History - Lower extremity venography 2020. - Intravascular ultrasound. - peripheral vascular intervention. -  melanoma 2011. 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 no acute distress. Vitals Time Taken: 1:41 PM, Height: 63 in, Weight: 200 lbs, BMI: 35.4, Temperature: 97.8 F, Pulse: 73 bpm, Respiratory Rate: 18 breaths/min, Blood Pressure: 144/80 mmHg. Respiratory Normal work of breathing on room air.. General Notes: 09/17/2023: The wound continues to improve. The open areas are smaller  and the band of skin has gotten wider. The tissue color is possibly the best that I have ever seen it although it does remain somewhat fibrotic. There is slough on the wound surface. Integumentary (Hair, Skin) Wound #2 status is Open. Original cause of wound was Blister. The date acquired was: 07/06/2022. The wound has been in treatment 59 weeks. The wound is located on the Left,Posterior Lower Leg. The wound measures 3.5cm length x 1.7cm width x 0.2cm depth; 4.673cm^2 area and 0.935cm^3 volume. There is Fat Layer (Subcutaneous Tissue) exposed. There is no tunneling or undermining noted. There is a medium amount of serosanguineous drainage noted. The wound margin is distinct with the outline attached to the wound base. There is large (67-100%) red granulation within the wound bed. There is a small (1-33%) amount of necrotic tissue within the wound bed including Eschar and Adherent Slough. The periwound skin appearance had no abnormalities noted for texture. The periwound skin appearance had no abnormalities noted for moisture. The periwound skin appearance exhibited: Rubor. Periwound temperature was noted as No Abnormality. The periwound has tenderness on palpation. Assessment Active Problems ICD-10 Non-pressure chronic ulcer of other part of left lower leg with fat layer exposed Varicose veins of bilateral lower extremities with other complications Venous insufficiency (chronic) (peripheral) Polyneuropathy, unspecified Localized edema STARLENE, SEEFELDT ([id number]) [id number] Page 10 of 13 Procedures Wound #2 Pre-procedure diagnosis of Wound #2 is a Venous Leg Ulcer located on the Left,Posterior Lower Leg .Severity of Tissue Pre Debridement is: Fat layer exposed. There was a Excisional Skin/Subcutaneous Tissue Debridement with a total area of 4.67 sq cm performed by Duanne Guess, MD. With the following instrument(s): Curette to remove Viable and Non-Viable  tissue/material. Material removed includes Subcutaneous Tissue and Slough and after achieving pain control using Lidocaine 4% Topical Solution. No specimens were taken. A time out was conducted at 14:19, prior to the start of the procedure. A Minimum amount of bleeding was controlled with Pressure. The procedure was tolerated well. Post Debridement Measurements: 3.5cm length x 1.7cm width x 0.2cm depth; 0.935cm^3 volume. Character of Wound/Ulcer Post Debridement is improved. Severity of Tissue Post Debridement is: Fat layer exposed. Post procedure Diagnosis Wound #2: Same as Pre-Procedure Pre-procedure diagnosis of Wound #2 is a Venous Leg Ulcer located on the Left,Posterior Lower Leg . There was a Double Layer Compression Therapy Procedure by Karie Schwalbe, RN. Post procedure Diagnosis Wound #2: Same as Pre-Procedure There was a Double Layer Compression Therapy Procedure by Karie Schwalbe, RN. Post procedure Diagnosis Wound #: Same as Pre-Procedure Plan Follow-up Appointments: Return Appointment in 1 week. - Dr. Lady Gary Room 3 09/25/23 at 2:30pm Return Appointment in 2 weeks. - Please ask Front desk for an appointment Return appointment in 3 weeks. - Week of Thanksgiving-please schedule a nurse visit Anesthetic: Wound #2 Left,Posterior Lower Leg: (In clinic) Topical Lidocaine 4% applied to wound bed Bathing/ Shower/ Hygiene: May shower with protection but do not get wound dressing(s) wet. Protect dressing(s) with water  repellant cover (for example, large plastic bag) or a cast cover and may then take shower. Edema Control - Orders / Instructions: Other Edema Control Orders/Instructions: - Right Lymphadema non-wound Coflex w/Calamine Additional Orders / Instructions: Other: - Recommend Nail Care WOUND #2: - Lower Leg Wound Laterality: Left, Posterior Cleanser: Soap and Water 1 x Per Week/30 Days Discharge Instructions: May shower and wash wound with dial antibacterial soap and water  prior to dressing change. Peri-Wound Care: Ketoconazole Cream 2% 1 x Per Week/30 Days Discharge Instructions: Apply Ketoconazole as directed Peri-Wound Care: Triamcinolone 15 (g) 1 x Per Week/30 Days Discharge Instructions: Use triamcinolone 15 (g) as directed Peri-Wound Care: Zinc Oxide Ointment 30g tube 1 x Per Week/30 Days Discharge Instructions: Apply Zinc Oxide to periwound with each dressing change Peri-Wound Care: Sween Lotion (Moisturizing lotion) 1 x Per Week/30 Days Discharge Instructions: Apply moisturizing lotion as directed Topical: Gentamicin 1 x Per Week/30 Days Discharge Instructions: As directed by physician Topical: Mupirocin Ointment 1 x Per Week/30 Days Discharge Instructions: Apply Mupirocin (Bactroban) as instructed Topical: Skintegrity Hydrogel 4 (oz) 1 x Per Week/30 Days Discharge Instructions: Apply hydrogel as directed Prim Dressing: Promogran Prisma Matrix, 4.34 (sq in) (silver collagen) 1 x Per Week/30 Days ary Discharge Instructions: Moisten collagen with saline or hydrogel Secondary Dressing: Optifoam Non-Adhesive Dressing, 4x4 in 1 x Per Week/30 Days Discharge Instructions: Apply over primary dressing as directed. Secondary Dressing: Zetuvit Plus 4x8 in 1 x Per Week/30 Days Discharge Instructions: Apply over primary dressing as directed. Com pression Wrap: CoFlex Calamine Unna Boot 4 x 6 (in/yd) 1 x Per Week/30 Days Discharge Instructions: Apply Coflex Calamine [employer] as directed. 09/17/2023: The wound continues to improve. The open areas are smaller and the band of skin has gotten wider. The tissue color is possibly the best that I have ever seen it although it does remain somewhat fibrotic. There is slough on the wound surface. I used a curette to debride slough and subcutaneous tissue from her wound. We will continue the mixture of topical gentamicin and mupirocin followed by Prisma silver collagen moistened with hydrogel and Optifoam to cover for  moisture maintenance. Continue bilateral calamine Coflex wraps. Follow-up in 1 week. Electronic Signature(s) Signed: 09/18/2023 9:08:26 AM By: Shawn Stall RN, [name] Signed: 09/18/2023 9:13:11 AM By: Duanne Guess MD FACS Previous Signature: 09/17/2023 2:25:50 PM Version By: Duanne Guess MD FACS Entered By: Shawn Stall on 09/18/2023 06:06:33 [name], [name] ([id number]) [id number] Page 11 of 13 -------------------------------------------------------------------------------- HxROS Details Patient Name: Date of Service: [name], [name] 09/17/2023 1:45 PM Medical Record Number: [id number] Patient Account Number: [id number] Date of Birth/Sex: Treating RN: 04/30/1946 (77 y.o. F) Primary Care Provider: Eustaquio Boyden Other Clinician: Referring Provider: Treating Provider/Extender: Priscille Loveless in Treatment: 32 Information Obtained From Patient Eyes Medical History: Positive for: Cataracts - L eye Ear/Nose/Mouth/Throat Medical History: Negative for: Chronic sinus problems/congestion; Middle ear problems Hematologic/Lymphatic Medical History: Negative for: Anemia; Human Immunodeficiency Virus; Lymphedema Respiratory Medical History: Positive for: Asthma Past Medical History Notes: OSA on CPAP Cardiovascular Medical History: Positive for: Hypertension Past Medical History Notes: Cronic venous insufficiency 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 Gastrointestinal Medical History: Past Medical History Notes: liver cyst Endocrine Medical History: Negative for: Type I Diabetes; Type II Diabetes Musculoskeletal Medical History: Past Medical History Notes: arthritis Neurologic Medical History: Positive for: Neuropathy - Feet and finger Negative for: Seizure Disorder Past Medical History Notes: restless leg syndrome Oncologic Medical History: Positive for: Received Chemotherapy - 2012;  Received Radiation - [address] TAHLEA, OSWELL ([id number]) [id number] Page 12 of 13 HBO Extended History Items Eyes: Cataracts Immunizations Pneumococcal Vaccine: Received Pneumococcal Vaccination: Yes Received Pneumococcal Vaccination On or After 60th Birthday: Yes Implantable Devices No devices added Hospitalization / Surgery History Type of Hospitalization/Surgery Lower extremity venography 2020 Intravascular ultrasound peripheral vascular intervention melanoma 2011 Family and Social History Cancer: Yes - Mother,Paternal Grandparents; Diabetes: Yes - Siblings; Heart Disease: Yes - Father; Hypertension: Yes - Father; Former smoker - quit in [employer]) Signed: 09/17/2023 2:54:12 PM By: Duanne Guess MD FACS Entered By: Duanne Guess on 09/17/2023 11:24:35 -------------------------------------------------------------------------------- SuperBill Details Patient Name: Date of Service: ANTANAE, KOEPP 09/17/2023 Medical Record Number: [id number] Patient Account Number: [id number] Date of Birth/Sex: Treating RN: Aug 20, 1946 (77 y.o. F) Primary Care Provider: Eustaquio Boyden Other Clinician: Referring Provider: Treating Provider/Extender: Priscille Loveless in Treatment: 59 Diagnosis Coding ICD-10 Codes Code Description [phone] Non-pressure chronic ulcer of other part of left lower leg with fat layer exposed I83.893 Varicose veins of bilateral lower extremities with other complications I87.2 Venous insufficiency (chronic) (peripheral) G62.9 Polyneuropathy, unspecified R60.0 Localized edema Facility Procedures : CPT4 Code: [id number] Description: 11042 - DEB SUBQ TISSUE 20 SQ CM/< ICD-10 Diagnosis Description L97.822 Non-pressure chronic ulcer of other part of left lower leg with fat layer expo Modifier: sed Quantity: 1 Physician Procedures : CPT4 Code Description Modifier [id number] - WC PHYS LEVEL 4 -  EST PT ICD-10 Diagnosis Description L97.822 Non-pressure chronic ulcer of other part of left lower leg with fat layer exposed I87.2 Venous insufficiency (chronic) (peripheral) Z56.387  Varicose veins of bilateral lower extremities with other complications LOUINE, SCHALK ([id number]) [id number]. G62.9 Polyneuropathy, unspecified Quantity: 1 pdf Page 13 of 13 : [id number] 11042 - WC PHYS SUBQ TISS 20 SQ CM 1 ICD-10 Diagnosis Description L97.822 Non-pressure chronic ulcer of other part of left lower leg with fat layer exposed Quantity: Electronic Signature(s) Signed: 09/17/2023 2:26:47 PM By: Duanne Guess MD FACS Entered By: Duanne Guess on 09/17/2023 11:26:46

## 2023-09-17 NOTE — Progress Notes (Signed)
[name] [name] ([id number]) [id number] Page 1 of 8 Visit Report for 09/17/2023 Arrival Information Details Patient Name: Date of Service: [name] [name] 09/17/2023 1:45 PM Medical Record Number: [id number] Patient Account Number: [id number] Date of Birth/Sex: Treating RN: 1946-06-07 (77 y.o. [name] [name], [name] [name] Primary Care Wilder Amodei: Eustaquio Boyden Other Clinician: Referring Annalise Mcdiarmid: Treating Ridley Dileo/Extender: Priscille Loveless in Treatment: 52 Visit Information History Since Last Visit Added or deleted any medications: No Patient Arrived: Ambulatory Any new allergies or adverse reactions: No Arrival Time: 13:37 Had a fall or experienced change in No Accompanied By: self activities of daily living that may affect Transfer Assistance: None risk of falls: Patient Identification Verified: Yes Signs or symptoms of abuse/neglect since last visito No Secondary Verification Process Completed: Yes Hospitalized since last visit: No Patient Requires Transmission-Based Precautions: No Implantable device outside of the clinic excluding No Patient Has Alerts: No cellular tissue based products placed in the center since last visit: Has Dressing in Place as Prescribed: Yes Has Compression in Place as Prescribed: Yes Pain Present Now: No Electronic Signature(s) Signed: 09/17/2023 4:40:40 PM By: Zenaida Deed RN, [name] Entered By: Zenaida Deed on 09/17/2023 10:41:07 -------------------------------------------------------------------------------- Compression Therapy Details Patient Name: Date of Service: [name] [name] [name] [name] 09/17/2023 1:45 PM Medical Record Number: [id number] Patient Account Number: [id number] Date of Birth/Sex: Treating RN: 10-24-1946 (77 y.o. [name] [name] Primary Care Cruze Zingaro: Eustaquio Boyden Other Clinician: Referring Zain Bingman: Treating Tashanda Fuhrer/Extender: Priscille Loveless in Treatment:  62 Compression Therapy Performed for Wound Assessment: Wound #2 Left,Posterior Lower Leg Performed By: Clinician Karie Schwalbe, RN Compression Type: Double Layer Post Procedure Diagnosis Same as Pre-procedure Electronic Signature(s) Signed: 09/17/2023 4:53:02 PM By: Karie Schwalbe RN Entered By: Karie [name] on 09/17/2023 11:22:56 [name] [name] ([id number]) [id number] Page 2 of 8 -------------------------------------------------------------------------------- Compression Therapy Details Patient Name: Date of Service: [name] [name] [name] [name] 09/17/2023 1:45 PM Medical Record Number: [id number] Patient Account Number: [id number] Date of Birth/Sex: Treating RN: 1946-05-21 (77 y.o. [name] [name] Primary Care Naman Spychalski: Eustaquio Boyden Other Clinician: Referring Corissa Oguinn: Treating Taejon Irani/Extender: Priscille Loveless in Treatment: 67 Compression Therapy Performed for Wound Assessment: NonWound Condition Lymphedema - Right Leg Performed By: Clinician Karie Schwalbe, RN Compression Type: Double Layer Post Procedure Diagnosis Same as Pre-procedure Electronic Signature(s) Signed: 09/17/2023 4:53:02 PM By: Karie Schwalbe RN Entered By: Karie [name] on 09/17/2023 11:23:12 -------------------------------------------------------------------------------- Encounter Discharge Information Details Patient Name: Date of Service: [name] [name] [name] [name] 09/17/2023 1:45 PM Medical Record Number: [id number] Patient Account Number: [id number] Date of Birth/Sex: Treating RN: 02/02/1946 (77 y.o. [name] [name] Primary Care Xaidyn Kepner: Eustaquio Boyden Other Clinician: Referring Zakari Couchman: Treating Jarissa Sheriff/Extender: Priscille Loveless in Treatment: 20 Encounter Discharge Information Items Post Procedure Vitals Discharge Condition: Stable Temperature (F): 97.8 Ambulatory Status: Ambulatory Pulse (bpm): 73 Discharge  Destination: Home Respiratory Rate (breaths/min): 18 Transportation: Private Auto Blood Pressure (mmHg): 144/80 Accompanied By: self Schedule Follow-up Appointment: Yes Clinical Summary of Care: Patient Declined Electronic Signature(s) Signed: 09/17/2023 4:53:02 PM By: Karie Schwalbe RN Entered By: Karie [name] on 09/17/2023 13:49:46 -------------------------------------------------------------------------------- Lower Extremity Assessment Details Patient Name: Date of Service: [name] [name] [name] [name] 09/17/2023 1:45 PM Medical Record Number: [id number] Patient Account Number: [id number] Date of Birth/Sex: Treating RN: 1946-08-04 (77 y.o. [name] [name] Primary Care Chanin Frumkin: Eustaquio Boyden Other Clinician: Referring Lahoma Constantin: Treating Yamilett Anastos/Extender: Katharina Caper Weeks in Treatment: 59 Edema Assessment Assessed: [Left: No] [Right: No] Edema: [Left: Ye] [Right: s] Calf [name], [name] ([id number]) [id number] Page 3 of 8  Left: Right: Point of Measurement: 31 cm From Medial Instep 39.5 cm 40.1 cm Ankle Left: Right: Point of Measurement: 9 cm From Medial Instep 23 cm 24 cm Vascular Assessment Pulses: Dorsalis Pedis Palpable: [Left:Yes] [Right:Yes] Extremity colors, hair growth, and conditions: Extremity Color: [Left:Normal] Hair Growth on Extremity: [Left:Yes] Temperature of Extremity: [Left:Warm] Capillary Refill: [Left:< 3 seconds] Dependent Rubor: [Left:No No] Electronic Signature(s) Signed: 09/17/2023 4:40:40 PM By: Zenaida Deed RN, [name] Entered By: Zenaida Deed on 09/17/2023 10:46:54 -------------------------------------------------------------------------------- Multi Wound Chart Details Patient Name: Date of Service: [name] [name] 09/17/2023 1:45 PM Medical Record Number: [id number] Patient Account Number: [id number] Date of Birth/Sex: Treating RN: 10-28-1946 (77 y.o. F) Primary Care Saivon Prowse: Eustaquio Boyden Other Clinician: Referring Caileigh Canche: Treating Cheril Slattery/Extender: Priscille Loveless in Treatment: 59 Vital Signs Height(in): 63 Pulse(bpm): 73 Weight(lbs): 200 Blood Pressure(mmHg): 144/80 Body Mass Index(BMI): 35.4 Temperature(F): 97.8 Respiratory Rate(breaths/min): 18 [2:Photos:] [N/A:N/A] Left, Posterior Lower Leg N/A N/A Wound Location: Blister N/A N/A Wounding Event: Venous Leg Ulcer N/A N/A Primary Etiology: Cataracts, Asthma, Hypertension, N/A N/A Comorbid History: Neuropathy, Received Chemotherapy, Received Radiation 07/06/2022 N/A N/A Date Acquired: 60 N/A N/A Weeks of Treatment: Open N/A N/A Wound Status: No N/A N/A Wound Recurrence: 3.5x1.7x0.2 N/A N/A Measurements L x W x D (cm) 4.673 N/A N/A A (cm) : rea 0.935 N/A N/A Volume (cm) : 32.00% N/A N/A % Reduction in Area: -36.10% N/A N/A % Reduction in Volume: Full Thickness Without Exposed N/A N/A ClassificationKEILEE, [name] [name] ([id number]) [id number] Page 4 of 8 Support Structures Medium N/A N/A Exudate A mount: Serosanguineous N/A N/A Exudate Type: red, brown N/A N/A Exudate Color: Distinct, outline attached N/A N/A Wound Margin: Large (67-100%) N/A N/A Granulation Amount: Red N/A N/A Granulation Quality: Small (1-33%) N/A N/A Necrotic Amount: Eschar, Adherent Slough N/A N/A Necrotic Tissue: Fat Layer (Subcutaneous Tissue): Yes N/A N/A Exposed Structures: Fascia: No Tendon: No Muscle: No Joint: No Bone: No Small (1-33%) N/A N/A Epithelialization: Debridement - Excisional N/A N/A Debridement: Pre-procedure Verification/Time Out 14:19 N/A N/A Taken: Lidocaine 4% Topical Solution N/A N/A Pain Control: Subcutaneous, Slough N/A N/A Tissue Debrided: Skin/Subcutaneous Tissue N/A N/A Level: 4.67 N/A N/A Debridement A (sq cm): rea Curette N/A N/A Instrument: Minimum N/A N/A Bleeding: Pressure N/A N/A Hemostasis A  chieved: Procedure was tolerated well N/A N/A Debridement Treatment Response: 3.5x1.7x0.2 N/A N/A Post Debridement Measurements L x W x D (cm) 0.935 N/A N/A Post Debridement Volume: (cm) Scarring: Yes N/A N/A Periwound Skin Texture: Maceration: No N/A N/A Periwound Skin Moisture: Dry/Scaly: No Rubor: Yes N/A N/A Periwound Skin Color: No Abnormality N/A N/A Temperature: Yes N/A N/A Tenderness on Palpation: Compression Therapy N/A N/A Procedures Performed: Debridement Treatment Notes Electronic Signature(s) Signed: 09/17/2023 2:23:38 PM By: Duanne Guess MD FACS Entered By: Duanne Guess on 09/17/2023 11:23:38 -------------------------------------------------------------------------------- Multi-Disciplinary Care Plan Details Patient Name: Date of Service: [name] [name] [name] [name] 09/17/2023 1:45 PM Medical Record Number: [id number] Patient Account Number: [id number] Date of Birth/Sex: Treating RN: 1946/02/05 (77 y.o. [name] [name] Primary Care Aiden Helzer: Eustaquio Boyden Other Clinician: Referring Elta Angell: Treating Joliet Mallozzi/Extender: Priscille Loveless in Treatment: 48 Multidisciplinary Care Plan reviewed with physician Active Inactive Venous Leg Ulcer Nursing Diagnoses: Knowledge deficit related to disease process and management Potential for venous Insuffiency (use before diagnosis confirmed) Goals: Patient will maintain optimal edema control Date Initiated: 02/07/2023 Target Resolution Date: 10/06/2023 Goal Status: Active Interventions: MORIREOLUWA, HOLLIE ([id number]) [phone].pdf Page 5 of 8 Assess peripheral edema status every visit. Compression as ordered  Treatment Activities: Therapeutic compression applied : 02/07/2023 Notes: Wound/Skin Impairment Nursing Diagnoses: Impaired tissue integrity Goals: Ulcer/skin breakdown will have a volume reduction of 30% by week 4 Date Initiated: 08/03/2022 Date Inactivated:  09/10/2022 Target Resolution Date: 08/31/2022 Unmet Reason: uncontrolled edema, Goal Status: Unmet acute infection Ulcer/skin breakdown will have a volume reduction of 50% by week 8 Date Initiated: 09/10/2022 Target Resolution Date: 10/06/2023 Goal Status: Active Interventions: Assess ulceration(s) every visit Treatment Activities: Skin care regimen initiated : 08/03/2022 Notes: Keystone ordered. 10/30 RX Levo started. 09/10/22 Electronic Signature(s) Signed: 09/17/2023 4:53:02 PM By: Karie Schwalbe RN Entered By: Karie [name] on 09/17/2023 13:48:12 -------------------------------------------------------------------------------- Pain Assessment Details Patient Name: Date of Service: [name] [name] [name] [name] 09/17/2023 1:45 PM Medical Record Number: [id number] Patient Account Number: [id number] Date of Birth/Sex: Treating RN: [birth year] (77 y.o. [name] [name] Primary Care Cace Osorto: Eustaquio Boyden Other Clinician: Referring Webb Weed: Treating Zakiah Gauthreaux/Extender: Priscille Loveless in Treatment: 28 Active Problems Location of Pain Severity and Description of Pain Patient Has Paino No Site Locations Rate the pain. Current Pain Level: 0 Pain Management and Medication Current Pain Management: KAYDE, ARANA ([id number]) [phone].pdf Page 6 of 8 Electronic Signature(s) Signed: 09/17/2023 4:40:40 PM By: Zenaida Deed RN, [name] Entered By: Zenaida Deed on 09/17/2023 10:41:54 -------------------------------------------------------------------------------- Patient/Caregiver Education Details Patient Name: Date of Service: [name] [name] [name] [name] 11/12/2024andnbsp1:45 PM Medical Record Number: [id number] Patient Account Number: [id number] Date of Birth/Gender: Treating RN: 1946/04/30 (77 y.o. [name] [name] Primary Care Physician: Eustaquio Boyden Other Clinician: Referring Physician: Treating Physician/Extender: Priscille Loveless in Treatment: 52 Education Assessment Education Provided To: Patient Education Topics Provided Wound/Skin Impairment: Methods: Explain/Verbal Responses: State content correctly Electronic Signature(s) Signed: 09/17/2023 4:53:02 PM By: Karie Schwalbe RN Entered By: Karie [name] on 09/17/2023 13:48:25 -------------------------------------------------------------------------------- Wound Assessment Details Patient Name: Date of Service: [name] [name] [name] [name] 09/17/2023 1:45 PM Medical Record Number: [id number] Patient Account Number: [id number] Date of Birth/Sex: Treating RN: Oct 15, 1946 (77 y.o. [name] [name] Primary Care Aima Mcwhirt: Eustaquio Boyden Other Clinician: Referring Boomer Winders: Treating Amillia Biffle/Extender: Katharina Caper Weeks in Treatment: 59 Wound Status Wound Number: 2 Primary Venous Leg Ulcer Etiology: Wound Location: Left, Posterior Lower Leg Wound Open Wounding Event: Blister Status: Date Acquired: 07/06/2022 Comorbid Cataracts, Asthma, Hypertension, Neuropathy, Received Weeks Of Treatment: 59 History: Chemotherapy, Received Radiation Clustered Wound: No Photos [name] [name], LUNDELL ([id number]) [id number] Page 7 of 8 Wound Measurements Length: (cm) 3.5 Width: (cm) 1.7 Depth: (cm) 0.2 Area: (cm) 4.673 Volume: (cm) 0.935 % Reduction in Area: 32% % Reduction in Volume: -36.1% Epithelialization: Small (1-33%) Tunneling: No Undermining: No Wound Description Classification: Full Thickness Without Exposed Support Structures Wound Margin: Distinct, outline attached Exudate Amount: Medium Exudate Type: Serosanguineous Exudate Color: red, brown Foul Odor After Cleansing: No Slough/Fibrino Yes Wound Bed Granulation Amount: Large (67-100%) Exposed Structure Granulation Quality: Red Fascia Exposed: No Necrotic Amount: Small (1-33%) Fat Layer (Subcutaneous Tissue) Exposed: Yes Necrotic  Quality: Eschar, Adherent Slough Tendon Exposed: No Muscle Exposed: No Joint Exposed: No Bone Exposed: No Periwound Skin Texture Texture Color No Abnormalities Noted: Yes No Abnormalities Noted: No Rubor: Yes Moisture No Abnormalities Noted: Yes Temperature / Pain Temperature: No Abnormality Tenderness on Palpation: Yes Treatment Notes Wound #2 (Lower Leg) Wound Laterality: Left, Posterior Cleanser Soap and Water Discharge Instruction: May shower and wash wound with dial antibacterial soap and water prior to dressing change. Peri-Wound Care Ketoconazole Cream 2% Discharge Instruction: Apply Ketoconazole as directed Triamcinolone 15 (g) Discharge Instruction: Use triamcinolone 15 (g) as directed  Zinc Oxide Ointment 30g tube Discharge Instruction: Apply Zinc Oxide to periwound with each dressing change Sween Lotion (Moisturizing lotion) Discharge Instruction: Apply moisturizing lotion as directed Topical Gentamicin Discharge Instruction: As directed by physician Mupirocin Ointment Discharge Instruction: Apply Mupirocin (Bactroban) as instructed Skintegrity Hydrogel 4 (oz) Discharge Instruction: Apply hydrogel as directed Primary Dressing AMYLAH, [name] ([id number]) [phone].pdf Page 8 of 8 Promogran Prisma Matrix, 4.34 (sq in) (silver collagen) Discharge Instruction: Moisten collagen with saline or hydrogel Secondary Dressing Optifoam Non-Adhesive Dressing, 4x4 in Discharge Instruction: Apply over primary dressing as directed. Zetuvit Plus 4x8 in Discharge Instruction: Apply over primary dressing as directed. Secured With Compression Wrap CoFlex Calamine Unna Boot 4 x 6 (in/yd) Discharge Instruction: Apply Coflex Calamine [employer] as directed. Compression Stockings Add-Ons Electronic Signature(s) Signed: 09/17/2023 4:40:40 PM By: Zenaida Deed RN, [name] Signed: 09/17/2023 4:53:02 PM By: Karie Schwalbe RN Entered By: Karie [name] on  09/17/2023 11:20:03 -------------------------------------------------------------------------------- Vitals Details Patient Name: Date of Service: TONEISHA, STRANGER 09/17/2023 1:45 PM Medical Record Number: [id number] Patient Account Number: [id number] Date of Birth/Sex: Treating RN: [birth year] (77 y.o. [name] [name] Primary Care Dorraine Ellender: Eustaquio Boyden Other Clinician: Referring Dennette Faulconer: Treating Robyn Nohr/Extender: Priscille Loveless in Treatment: 53 Vital Signs Time Taken: 13:41 Temperature (F): 97.8 Height (in): 63 Pulse (bpm): 73 Weight (lbs): 200 Respiratory Rate (breaths/min): 18 Body Mass Index (BMI): 35.4 Blood Pressure (mmHg): 144/80 Reference Range: 80 - 120 mg / dl Electronic Signature(s) Signed: 09/17/2023 4:40:40 PM By: Zenaida Deed RN, [name] Entered By: Zenaida Deed on 09/17/2023 10:41:45

## 2023-09-18 ENCOUNTER — Ambulatory Visit (HOSPITAL_BASED_OUTPATIENT_CLINIC_OR_DEPARTMENT_OTHER): Payer: Medicare Other | Admitting: General Surgery

## 2023-09-25 ENCOUNTER — Encounter (HOSPITAL_BASED_OUTPATIENT_CLINIC_OR_DEPARTMENT_OTHER): Payer: Medicare Other | Admitting: General Surgery

## 2023-09-25 DIAGNOSIS — I872 Venous insufficiency (chronic) (peripheral): Secondary | ICD-10-CM | POA: Diagnosis not present

## 2023-09-25 DIAGNOSIS — L97222 Non-pressure chronic ulcer of left calf with fat layer exposed: Secondary | ICD-10-CM | POA: Diagnosis not present

## 2023-09-25 DIAGNOSIS — L97822 Non-pressure chronic ulcer of other part of left lower leg with fat layer exposed: Secondary | ICD-10-CM | POA: Diagnosis not present

## 2023-09-25 DIAGNOSIS — I10 Essential (primary) hypertension: Secondary | ICD-10-CM | POA: Diagnosis not present

## 2023-09-25 DIAGNOSIS — G4733 Obstructive sleep apnea (adult) (pediatric): Secondary | ICD-10-CM | POA: Diagnosis not present

## 2023-09-25 DIAGNOSIS — L97818 Non-pressure chronic ulcer of other part of right lower leg with other specified severity: Secondary | ICD-10-CM | POA: Diagnosis not present

## 2023-09-25 DIAGNOSIS — G629 Polyneuropathy, unspecified: Secondary | ICD-10-CM | POA: Diagnosis not present

## 2023-09-25 DIAGNOSIS — I890 Lymphedema, not elsewhere classified: Secondary | ICD-10-CM | POA: Diagnosis not present

## 2023-09-25 NOTE — Progress Notes (Signed)
BLANCHE, KIMMINS ([id number]) [id number] Page 1 of 13 Visit Report for 09/25/2023 Chief Complaint Document Details Patient Name: Date of Service: [name], [name] 09/25/2023 2:30 PM Medical Record Number: [id number] Patient Account Number: [id number] Date of Birth/Sex: Treating RN: October 10, 1946 (77 y.o. F) Primary Care Provider: Eustaquio Boyden Other Clinician: Referring Provider: Treating Provider/Extender: Priscille Loveless in Treatment: 60 Information Obtained from: Patient Chief Complaint Patient presents for treatment of an open ulcer due to venous insufficiency Electronic Signature(s) Signed: 09/25/2023 3:33:56 PM By: Duanne Guess MD FACS Entered By: Duanne Guess on 09/25/2023 12:33:56 -------------------------------------------------------------------------------- Debridement Details Patient Name: Date of Service: [name], [name] 09/25/2023 2:30 PM Medical Record Number: [id number] Patient Account Number: [id number] Date of Birth/Sex: Treating RN: [birth year] (77 y.o. [name] Primary Care Provider: Eustaquio Boyden Other Clinician: Referring Provider: Treating Provider/Extender: Priscille Loveless in Treatment: 60 Debridement Performed for Assessment: Wound #2 Left,Posterior Lower Leg Performed By: Physician Duanne Guess, MD The following information was scribed by: Samuella Bruin The information was scribed for: Duanne Guess Debridement Type: Debridement Severity of Tissue Pre Debridement: Fat layer exposed Level of Consciousness (Pre-procedure): Awake and Alert Pre-procedure Verification/Time Out Yes - 15:03 Taken: Start Time: 15:03 Pain Control: Lidocaine 4% T opical Solution Percent of Wound Bed Debrided: 100% T Area Debrided (cm): otal 3.85 Tissue and other material debrided: Non-Viable, Slough, Subcutaneous, Slough Level: Skin/Subcutaneous Tissue Debridement  Description: Excisional Instrument: Curette Bleeding: Minimum Hemostasis Achieved: Pressure Response to Treatment: Procedure was tolerated well Level of Consciousness (Post- Awake and Alert procedure): Post Debridement Measurements of Total Wound Length: (cm) 3.5 Width: (cm) 1.4 Depth: (cm) 0.2 Volume: (cm) 0.77 Character of Wound/Ulcer Post Debridement: Improved Severity of Tissue Post Debridement: Fat layer exposed [name], [name] ([id number]) [id number] Page 2 of 13 Post Procedure Diagnosis Same as Pre-procedure Electronic Signature(s) Signed: 09/25/2023 3:24:43 PM By: Samuella Bruin Signed: 09/25/2023 3:57:12 PM By: Duanne Guess MD FACS Entered By: Samuella Bruin on 09/25/2023 12:05:40 -------------------------------------------------------------------------------- HPI Details Patient Name: Date of Service: [name], [name] 09/25/2023 2:30 PM Medical Record Number: [id number] Patient Account Number: [id number] Date of Birth/Sex: Treating RN: [birth year] (77 y.o. F) Primary Care Provider: Eustaquio Boyden Other Clinician: Referring Provider: Treating Provider/Extender: Priscille Loveless in Treatment: 60 History of Present Illness HPI Description: ADMISSION This is a 77 year old woman with a history of chronic venous insufficiency status post saphenous vein ablations in 2010 and 2016. 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 She presents today with an open venous ulcer on her left lower leg. It has been present for a little over 2 weeks. She saw her primary care provider who apparently swabbed the wound and grew out Pseudomonas. She just completed a course of ciprofloxacin for this. ABI was 0.91. She reports that she has had previous issues with ulcers in this same location, stemming back to a punch biopsy taken by a dermatologist many years ago. She has had several skin substitutes  applied to the area that have ultimately resulted in healing on prior occasions. She has 2 small ulcers on her left medial lower leg. There is slough accumulation in both of them. The more anterior of the 2 is quite small and has some soft slough on the surface, underneath which there is good granulation tissue. The more medial wound also has slough accumulation, but the underlying surface is fairly fibrotic and gritty. This is consistent with her provided history of multiple ulcers in the same location. 08/03/2022:  The anterior wound is smaller today with just a little bit of slough accumulation. The more medial wound continues to be very sensitive and fibrotic with slough buildup. 08/13/2022: The anterior wound is closed. The more medial wound remains sensitive with a fairly fibrotic surface. There is more granulation tissue filling in, however, and there is less slough than on prior occasions. 08/20/2022: The anterior wound remains closed. The more medial wound has filled with granulation tissue but still has a fair amount of slough on the surface and remains fairly tender. Unfortunately, she has developed 2 small ulcers just proximal to this. The fat layer is exposed in each. She says that over the weekend, she felt a burning sensation in that location. 08/27/2022: The 2 small wounds proximal to the main wound have merged into a single site. The wounds look a little bit dry, but they are quite clean without significant slough accumulation. They remain quite tender. 09/03/2022: All of the wounds have now merged into 1 large wound. There is a strong odor coming from the wound and the surface is not particularly viable. It is extremely painful for her today. 09/10/2022: The wound is less black and purple this week but still does not look particularly viable. The surface is desiccated. The culture that I took last week returned with a polymicrobial population including Pseudomonas. I prescribed both  Augmentin and levofloxacin. She has not yet picked up levofloxacin, as her pharmacy only notified her yesterday that it was ready. We have ordered a [employer] topical compounded antibiotic, but it has not yet come. 09/17/2022: The wound surface is markedly improved today. There is still an area of grayish-looking muscle but the rest appears significantly more viable. There is still a layer of slough on the surface. She still has a couple days left of oral antibiotic therapy. She has her Jodie Echevaria compounded topical antibiotic with her today. 09/24/2022: She has completed her oral antibiotic therapy. The wound surface is much cleaner today and more viable without any necrotic tissue. It is a bit desiccated, however. 10/01/2022: The moisture balance of the wound has improved. There is a layer of yellow slough on the surface, but beneath this, the wound is more pink. 10/08/2022: The wound is smaller and cleaner today with a layer of slough present. She is having less pain. 12/18; this patient has a new wound on the right lateral ankle which apparently started after a cat scratched her. Secondarily she managed to drop a cake pan on the same area. This is very painful. The original wound was on the left posterior calf we have been using [employer] under an [employer]. The Unna boot fell down and apparently she was in for a nurse visit perhaps last week and that 1 fell down as well This patient has central venous mediated hypertension. For member correctly she has a stent placed in the left common iliac artery by Dr. Randie Heinz some years ago [facility] Thurner syndrome] she also has had venous ablations and I believe a history of a DVT The patient has compression pumps at home but she does not use them 10/30/2022: The patient says that her wounds burned all week with the [facility] classic in place. She has a fair amount of slough and nonviable subcutaneous tissue present on the right ankle. The left posterior  calf wound is cleaner than I have seen it to date. Both remain fairly tender. IZZI, SOUTHERS ([id number]) [id number] Page 3 of 13 11/14/2022: Both wounds have substantial slough  and eschar accumulation. They remain extremely tender. 11/21/2022: The wounds are cleaner this week, but still have slough and eschar accumulation. She continues to describe burning pain in both wounds, but upon further questioning, she actually has burning in both of her feet that radiates up her legs and includes to the wounds. 11/28/2022: Both wounds have slough and eschar accumulation, as well as adherent silver alginate that was difficult to remove. She continues to have pain out of proportion to the extent of her wounds. Her PCP did initiate Lyrica which she started taking last night. The dose is quite low, only 25 mg, but apparently there is a plan for upward titration, assuming she tolerates the medication. 12/05/2022: The wounds are both a little bit smaller today, but have significant slough accumulation, as usual. They remain exquisitely tender. She is now taking Lyrica twice a day. 12/19/2022: Both of her wounds look significantly better this week. There is slough buildup on both, but they are smaller. She seems to be getting a good response from Lyrica as she is having much less pain. 12/26/2022: The wound on her right lateral ankle is nearly closed. The wound on her left posterior calf is smaller but still has a lot of slough accumulation. They remain tender. 01/02/2023: The right lateral ankle wound is down to just a couple of millimeters. It is much less tender than on prior occasions. There is minimal slough buildup. The wound on her posterior calf continues to contract, as well. It has thick slough buildup and remains fairly tender. 01/09/2023: The right lateral ankle wound is nearly closed. He remains tender. There is just a little eschar on the surface. The wound on her posterior calf  has improved quite a bit. There is still slough on the surface, but the more proximal area has epithelialized substantially and there is no longer any gray discoloration to her tissue. 3/13; patient presents for follow-up. Her right lateral ankle wound is almost healed. She has a wound to her left posterior calf with slough and granulation tissue. We have been using Santyl under Unna boots bilaterally to the lower extremities. She has no issues or complaints today. 01/30/2023: The right lateral ankle wound is down to just a pinhole under a layer of eschar. The left posterior calf wound is quite a bit improved since the last time I saw it. There is still a layer of slough on the surface but there has been more epithelialization. 02/07/2023: The right lateral ankle wound still has some eschar on the surface. The left posterior calf wound is about the same size, but with more areas of epithelialization. 02/20/2023: The right lateral ankle wound is healed. The left posterior calf wound is smaller and is essentially flush with the surrounding skin, but the surface is quite dry. Her leg wrap slipped and her calf is quite swollen above the level of the wound. 03/01/2023: The right lateral ankle wound reopened and she also suffered a fall that resulted in a small wound on her right anterior tibial surface. The left posterior calf wound has a much better moisture balance today. There is a layer of slough on the surface. The [employer] worked much better for her as far as compression this week. 03/06/2023: The right lateral ankle wound has a layer of eschar on the surface and is too painful for me to debride. The anterior tibial surface wound is more or less healed; it is a very superficial abrasion at this point. The left posterior calf wound  has a layer of slough on the surface. Her edema control is better. 03/13/2023: The right lateral ankle wound has a layer of eschar on the surface. Once this was debrided, it was  found to be healed. The anterior tibial surface wound has a layer of stuck silver alginate on the surface. This was removed to reveal a very superficial abrasion but not much change from last week. The left posterior calf wound is smaller and has its usual layer of accumulated slough. 03/25/2023: The anterior tibial surface wound has eschar and silver alginate stuck to it. Once this was removed, the wound was found to be healed. The left posterior calf wound is roughly the same size and has a little bit thicker layer of accumulated slough. 04/03/2023: She has had more drainage from the posterior calf wound over the last week and the layer of slough is thicker. She also says it is more tender. 04/10/2023: Continued increase in drainage from her wound. The wound remains tender with a layer of slough on the surface. It does not appear as viable this week. She also has erythema, swelling, and warmth in her entire left lower leg extending into the midfoot. 04/17/2023: The wound is about the same size this week. There is fibrotic adherent slough, but the drainage is not as profuse. She is currently taking levofloxacin. She is complaining of an increase in the burning sensation at her wound. 04/24/2023: The wound measured smaller this week and is much cleaner-looking. She completed her course of levofloxacin. Her PCP increased her dose of Lyrica and she reports that the burning has stopped. 05/01/2023: The wound is smaller again today and quite clean with only minimal slough on the surface. 05/08/2023: The wound is smaller again today. It is fairly clean, but the surface is a bit fibrotic. Moisture balance is better. Her leg is more swollen and erythematous today, however. 05/15/2023: The wound is about the same size today. The surface has very little slough accumulation, but remains fibrotic. Moisture balance is good. She is responding nicely to the oral Keflex she is taking and her leg is less erythematous and  edematous. 05/22/2023: The wound is unchanged in size. There is quite a bit of slough on the surface but the moisture balance is better. It is still fibrotic, but a little bit less so. Her leg is no longer erythematous, but she does have some persistent edema. 05/29/2023: The wound measured a little bit smaller today. It is dry again and the surface is quite fibrotic. The edema in her leg has improved. 06/12/2023: The wound measures slightly larger today. The moisture balance has improved markedly. There is soft slough on the wound surface. Edema control is improved. 06/19/2023: The wound measured slightly smaller today. There is a band of epithelium dividing the most proximal portion of the wound from the remainder. There is slough accumulation and moisture balance is improved. 06/26/2023: The band of epithelium between the 2 open areas has gotten wider. There is some slough accumulation on the surfaces along with a little bit of eschar. Moisture balance continues to be good. 07/03/2023: The band of epithelium continues to expand. The moisture balance remains good and the surface, particularly of the more distal aspect of the wound continues to improve. Slough and eschar accumulation, as usual. 07/10/2023: The wound measured smaller today. Moisture balance is good and the tissue underneath the layer of slough that has accumulated continues to improve. 07/17/2023: The wound is smaller again today. Moisture balance is appropriate. There  is a little bit of slough overlying granulation tissue. [name], [name] ([id number]) [id number] Page 4 of 13 07/24/2023: The band of epithelium between the 2 open areas has gotten wider. The more proximal area has more epithelialization around the perimeter. Both have slough on the surface and remain fairly fibrotic. Moisture balance remains good. 08/07/2023: Both open areas are little bit smaller today. The band of epithelium between the 2 continues  to enlarge. There is slough and eschar on both surfaces. 08/14/2023: No significant change to the wound dimensions. The surface is rather dry and fibrotic again. 08/21/2023: The wound is smaller and the skin bridge between the 2 open areas has gotten wider. There is slough accumulation on the surfaces and the moisture balance is improved. 08/28/2023: Both open areas of the wound are smaller today. There is slough on the surface, but it is quite soft. Moisture balance is good. 09/04/2023: The band of epithelium between the 2 open areas continues to widen. The wound is very sensitive today, more so than usual. No concern for infection based upon visual inspection, however. There is some slough on the wound surface, which remains fairly fibrotic. 09/11/2023: Both open areas are little bit smaller today. There is slough accumulation on the surface. The underlying tissue is still somewhat fibrotic, but the color is good. Edema control is good. 09/17/2023: The wound continues to improve. The open areas are smaller and the band of skin has gotten wider. The tissue color is possibly the best that I have ever seen it although it does remain somewhat fibrotic. There is slough on the wound surface. 09/25/2023: The wound is smaller again today. The slough on the surface is rubbery but the underlying tissue continues to improve, quality-wise. It is less fibrotic today. Edema control is good. Electronic Signature(s) Signed: 09/25/2023 3:34:43 PM By: Duanne Guess MD FACS Entered By: Duanne Guess on 09/25/2023 12:34:43 -------------------------------------------------------------------------------- Physical Exam Details Patient Name: Date of Service: [name], [name] 09/25/2023 2:30 PM Medical Record Number: [id number] Patient Account Number: [id number] Date of Birth/Sex: Treating RN: [birth year] (77 y.o. F) Primary Care Provider: Eustaquio Boyden Other Clinician: Referring Provider: Treating  Provider/Extender: Priscille Loveless in Treatment: 60 Constitutional ..... Respiratory Normal work of breathing on room air.. Notes 09/25/2023: The wound is smaller again today. The slough on the surface is rubbery but the underlying tissue continues to improve, quality-wise. It is less fibrotic today. Edema control is good. Electronic Signature(s) Signed: 09/25/2023 3:35:11 PM By: Duanne Guess MD FACS Entered By: Duanne Guess on 09/25/2023 12:35:11 -------------------------------------------------------------------------------- Physician Orders Details Patient Name: Date of Service: [name], [name] 09/25/2023 2:30 PM Medical Record Number: [id number] Patient Account Number: [id number] Date of Birth/Sex: Treating RN: 1946-03-26 (77 y.o. [name] Primary Care Provider: Eustaquio Boyden Other Clinician: Referring Provider: Treating Provider/Extender: Priscille Loveless in Treatment: 60 The following information was scribed by: Samuella Bruin The information was scribed for: Daleyzah, Schoner ([id number]) [id number] Page 5 of 13 Verbal / Phone Orders: No Diagnosis Coding ICD-10 Coding Code Description [phone] Non-pressure chronic ulcer of other part of left lower leg with fat layer exposed I83.893 Varicose veins of bilateral lower extremities with other complications I87.2 Venous insufficiency (chronic) (peripheral) G62.9 Polyneuropathy, unspecified R60.0 Localized edema Follow-up Appointments ppointment in 1 week. - Dr. Lady Gary Room 3 Return A Anesthetic Wound #2 Left,Posterior Lower Leg (In clinic) Topical Lidocaine 4% applied to wound bed Bathing/ Shower/ Hygiene May shower with protection but do not get wound  dressing(s) wet. Protect dressing(s) with water repellant cover (for example, large plastic bag) or a cast cover and may then take shower. Edema Control -  Orders / Instructions Other Edema Control Orders/Instructions: - Right Lymphadema non-wound Coflex w/Calamine Additional Orders / Instructions Other: - Recommend Nail Care Wound Treatment Wound #2 - Lower Leg Wound Laterality: Left, Posterior Cleanser: Soap and Water 1 x Per Week/30 Days Discharge Instructions: May shower and wash wound with dial antibacterial soap and water prior to dressing change. Peri-Wound Care: Ketoconazole Cream 2% 1 x Per Week/30 Days Discharge Instructions: Apply Ketoconazole as directed Peri-Wound Care: Triamcinolone 15 (g) 1 x Per Week/30 Days Discharge Instructions: Use triamcinolone 15 (g) as directed Peri-Wound Care: Zinc Oxide Ointment 30g tube 1 x Per Week/30 Days Discharge Instructions: Apply Zinc Oxide to periwound with each dressing change Peri-Wound Care: Sween Lotion (Moisturizing lotion) 1 x Per Week/30 Days Discharge Instructions: Apply moisturizing lotion as directed Topical: Gentamicin 1 x Per Week/30 Days Discharge Instructions: As directed by physician Topical: Mupirocin Ointment 1 x Per Week/30 Days Discharge Instructions: Apply Mupirocin (Bactroban) as instructed Topical: Skintegrity Hydrogel 4 (oz) 1 x Per Week/30 Days Discharge Instructions: Apply hydrogel as directed Prim Dressing: Promogran Prisma Matrix, 4.34 (sq in) (silver collagen) 1 x Per Week/30 Days ary Discharge Instructions: Moisten collagen with saline or hydrogel Secondary Dressing: Optifoam Non-Adhesive Dressing, 4x4 in 1 x Per Week/30 Days Discharge Instructions: Apply over primary dressing as directed. Secondary Dressing: Zetuvit Plus 4x8 in 1 x Per Week/30 Days Discharge Instructions: Apply over primary dressing as directed. Compression Wrap: CoFlex Calamine Unna Boot 4 x 6 (in/yd) 1 x Per Week/30 Days Discharge Instructions: Apply Coflex Calamine [employer] as directed. Patient Medications llergies: sulfa antibiotics, Voltaren, doxycycline, Neoprene, adhesive,  latex A Notifications Medication Indication Start End 09/25/2023 lidocaine DOSE topical 4 % cream - cream topical SEVI, SALAAM ([id number]) [phone].pdf Page 6 of 13 Electronic Signature(s) Signed: 09/25/2023 3:57:12 PM By: Duanne Guess MD FACS Previous Signature: 09/25/2023 3:24:43 PM Version By: Gelene Mink By: Duanne Guess on 09/25/2023 12:36:57 -------------------------------------------------------------------------------- Problem List Details Patient Name: Date of Service: [name], [name] 09/25/2023 2:30 PM Medical Record Number: [id number] Patient Account Number: [id number] Date of Birth/Sex: Treating RN: 10/15/1946 (77 y.o. F) Primary Care Provider: Eustaquio Boyden Other Clinician: Referring Provider: Treating Provider/Extender: Priscille Loveless in Treatment: 60 Active Problems ICD-10 Encounter Code Description Active Date MDM Diagnosis [phone] Non-pressure chronic ulcer of other part of left lower leg with fat layer exposed 07/27/2022 No Yes I83.893 Varicose veins of bilateral lower extremities with other complications 07/27/2022 No Yes I87.2 Venous insufficiency (chronic) (peripheral) 07/27/2022 No Yes G62.9 Polyneuropathy, unspecified 07/27/2022 No Yes R60.0 Localized edema 07/27/2022 No Yes Inactive Problems ICD-10 Code Description Active Date Inactive Date L03.115 Cellulitis of right lower limb 10/22/2022 10/22/2022 L97.818 Non-pressure chronic ulcer of other part of right lower leg with other specified severity 03/01/2023 10/22/2022 Resolved Problems Electronic Signature(s) Signed: 09/25/2023 3:33:34 PM By: Duanne Guess MD FACS Entered By: Duanne Guess on 09/25/2023 12:33:34 [name] ([id number]) [id number] Page 7 of 13 -------------------------------------------------------------------------------- Progress Note Details Patient Name: Date of  Service: [name], [name] 09/25/2023 2:30 PM Medical Record Number: [id number] Patient Account Number: [id number] Date of Birth/Sex: Treating RN: July 13, 1946 (77 y.o. F) Primary Care Provider: Eustaquio Boyden Other Clinician: Referring Provider: Treating Provider/Extender: Priscille Loveless in Treatment: 60 Subjective Chief Complaint Information obtained from Patient Patient presents for treatment of an open ulcer due to venous insufficiency  History of Present Illness (HPI) ADMISSION This is a 77 year old woman with a history of chronic venous insufficiency status post saphenous vein ablations in 2010 and 2016. 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 She presents today with an open venous ulcer on her left lower leg. It has been present for a little over 2 weeks. She saw her primary care provider who apparently swabbed the wound and grew out Pseudomonas. She just completed a course of ciprofloxacin for this. ABI was 0.91. She reports that she has had previous issues with ulcers in this same location, stemming back to a punch biopsy taken by a dermatologist many years ago. She has had several skin substitutes applied to the area that have ultimately resulted in healing on prior occasions. She has 2 small ulcers on her left medial lower leg. There is slough accumulation in both of them. The more anterior of the 2 is quite small and has some soft slough on the surface, underneath which there is good granulation tissue. The more medial wound also has slough accumulation, but the underlying surface is fairly fibrotic and gritty. This is consistent with her provided history of multiple ulcers in the same location. 08/03/2022: The anterior wound is smaller today with just a little bit of slough accumulation. The more medial wound continues to be very sensitive and fibrotic with slough buildup. 08/13/2022: The anterior wound is closed. The more medial  wound remains sensitive with a fairly fibrotic surface. There is more granulation tissue filling in, however, and there is less slough than on prior occasions. 08/20/2022: The anterior wound remains closed. The more medial wound has filled with granulation tissue but still has a fair amount of slough on the surface and remains fairly tender. Unfortunately, she has developed 2 small ulcers just proximal to this. The fat layer is exposed in each. She says that over the weekend, she felt a burning sensation in that location. 08/27/2022: The 2 small wounds proximal to the main wound have merged into a single site. The wounds look a little bit dry, but they are quite clean without significant slough accumulation. They remain quite tender. 09/03/2022: All of the wounds have now merged into 1 large wound. There is a strong odor coming from the wound and the surface is not particularly viable. It is extremely painful for her today. 09/10/2022: The wound is less black and purple this week but still does not look particularly viable. The surface is desiccated. The culture that I took last week returned with a polymicrobial population including Pseudomonas. I prescribed both Augmentin and levofloxacin. She has not yet picked up levofloxacin, as her pharmacy only notified her yesterday that it was ready. We have ordered a [facility] topical compounded antibiotic, but it has not yet come. 09/17/2022: The wound surface is markedly improved today. There is still an area of grayish-looking muscle but the rest appears significantly more viable. There is still a layer of slough on the surface. She still has a couple days left of oral antibiotic therapy. She has her Jodie Echevaria compounded topical antibiotic with her today. 09/24/2022: She has completed her oral antibiotic therapy. The wound surface is much cleaner today and more viable without any necrotic tissue. It is a bit desiccated, however. 10/01/2022: The moisture  balance of the wound has improved. There is a layer of yellow slough on the surface, but beneath this, the wound is more pink. 10/08/2022: The wound is smaller and cleaner today with a layer of  slough present. She is having less pain. 12/18; this patient has a new wound on the right lateral ankle which apparently started after a cat scratched her. Secondarily she managed to drop a cake pan on the same area. This is very painful. The original wound was on the left posterior calf we have been using [employer] under an [employer]. The Unna boot fell down and apparently she was in for a nurse visit perhaps last week and that 1 fell down as well This patient has central venous mediated hypertension. For member correctly she has a stent placed in the left common iliac artery by Dr. Randie Heinz some years ago [facility] [place] syndrome] she also has had venous ablations and I believe a history of a DVT The patient has compression pumps at home but she does not use them 10/30/2022: The patient says that her wounds burned all week with the [facility] classic in place. She has a fair amount of slough and nonviable subcutaneous tissue present on the right ankle. The left posterior calf wound is cleaner than I have seen it to date. Both remain fairly tender. 11/14/2022: Both wounds have substantial slough and eschar accumulation. They remain extremely tender. 11/21/2022: The wounds are cleaner this week, but still have slough and eschar accumulation. She continues to describe burning pain in both wounds, but upon further questioning, she actually has burning in both of her feet that radiates up her legs and includes to the wounds. 11/28/2022: Both wounds have slough and eschar accumulation, as well as adherent silver alginate that was difficult to remove. She continues to have pain out of proportion to the extent of her wounds. Her PCP did initiate Lyrica which she started taking last night. The dose is quite low, only  25 mg, but apparently there is a plan for upward titration, assuming she tolerates the medication. [name], [name] ([id number]) [id number] Page 8 of 13 12/05/2022: The wounds are both a little bit smaller today, but have significant slough accumulation, as usual. They remain exquisitely tender. She is now taking Lyrica twice a day. 12/19/2022: Both of her wounds look significantly better this week. There is slough buildup on both, but they are smaller. She seems to be getting a good response from Lyrica as she is having much less pain. 12/26/2022: The wound on her right lateral ankle is nearly closed. The wound on her left posterior calf is smaller but still has a lot of slough accumulation. They remain tender. 01/02/2023: The right lateral ankle wound is down to just a couple of millimeters. It is much less tender than on prior occasions. There is minimal slough buildup. The wound on her posterior calf continues to contract, as well. It has thick slough buildup and remains fairly tender. 01/09/2023: The right lateral ankle wound is nearly closed. He remains tender. There is just a little eschar on the surface. The wound on her posterior calf has improved quite a bit. There is still slough on the surface, but the more proximal area has epithelialized substantially and there is no longer any gray discoloration to her tissue. 3/13; patient presents for follow-up. Her right lateral ankle wound is almost healed. She has a wound to her left posterior calf with slough and granulation tissue. We have been using Santyl under Unna boots bilaterally to the lower extremities. She has no issues or complaints today. 01/30/2023: The right lateral ankle wound is down to just a pinhole under a layer of eschar. The left posterior calf  wound is quite a bit improved since the last time I saw it. There is still a layer of slough on the surface but there has been more epithelialization. 02/07/2023:  The right lateral ankle wound still has some eschar on the surface. The left posterior calf wound is about the same size, but with more areas of epithelialization. 02/20/2023: The right lateral ankle wound is healed. The left posterior calf wound is smaller and is essentially flush with the surrounding skin, but the surface is quite dry. Her leg wrap slipped and her calf is quite swollen above the level of the wound. 03/01/2023: The right lateral ankle wound reopened and she also suffered a fall that resulted in a small wound on her right anterior tibial surface. The left posterior calf wound has a much better moisture balance today. There is a layer of slough on the surface. The [employer] worked much better for her as far as compression this week. 03/06/2023: The right lateral ankle wound has a layer of eschar on the surface and is too painful for me to debride. The anterior tibial surface wound is more or less healed; it is a very superficial abrasion at this point. The left posterior calf wound has a layer of slough on the surface. Her edema control is better. 03/13/2023: The right lateral ankle wound has a layer of eschar on the surface. Once this was debrided, it was found to be healed. The anterior tibial surface wound has a layer of stuck silver alginate on the surface. This was removed to reveal a very superficial abrasion but not much change from last week. The left posterior calf wound is smaller and has its usual layer of accumulated slough. 03/25/2023: The anterior tibial surface wound has eschar and silver alginate stuck to it. Once this was removed, the wound was found to be healed. The left posterior calf wound is roughly the same size and has a little bit thicker layer of accumulated slough. 04/03/2023: She has had more drainage from the posterior calf wound over the last week and the layer of slough is thicker. She also says it is more tender. 04/10/2023: Continued increase in drainage from her  wound. The wound remains tender with a layer of slough on the surface. It does not appear as viable this week. She also has erythema, swelling, and warmth in her entire left lower leg extending into the midfoot. 04/17/2023: The wound is about the same size this week. There is fibrotic adherent slough, but the drainage is not as profuse. She is currently taking levofloxacin. She is complaining of an increase in the burning sensation at her wound. 04/24/2023: The wound measured smaller this week and is much cleaner-looking. She completed her course of levofloxacin. Her PCP increased her dose of Lyrica and she reports that the burning has stopped. 05/01/2023: The wound is smaller again today and quite clean with only minimal slough on the surface. 05/08/2023: The wound is smaller again today. It is fairly clean, but the surface is a bit fibrotic. Moisture balance is better. Her leg is more swollen and erythematous today, however. 05/15/2023: The wound is about the same size today. The surface has very little slough accumulation, but remains fibrotic. Moisture balance is good. She is responding nicely to the oral Keflex she is taking and her leg is less erythematous and edematous. 05/22/2023: The wound is unchanged in size. There is quite a bit of slough on the surface but the moisture balance is better. It is  still fibrotic, but a little bit less so. Her leg is no longer erythematous, but she does have some persistent edema. 05/29/2023: The wound measured a little bit smaller today. It is dry again and the surface is quite fibrotic. The edema in her leg has improved. 06/12/2023: The wound measures slightly larger today. The moisture balance has improved markedly. There is soft slough on the wound surface. Edema control is improved. 06/19/2023: The wound measured slightly smaller today. There is a band of epithelium dividing the most proximal portion of the wound from the remainder. There is slough accumulation  and moisture balance is improved. 06/26/2023: The band of epithelium between the 2 open areas has gotten wider. There is some slough accumulation on the surfaces along with a little bit of eschar. Moisture balance continues to be good. 07/03/2023: The band of epithelium continues to expand. The moisture balance remains good and the surface, particularly of the more distal aspect of the wound continues to improve. Slough and eschar accumulation, as usual. 07/10/2023: The wound measured smaller today. Moisture balance is good and the tissue underneath the layer of slough that has accumulated continues to improve. 07/17/2023: The wound is smaller again today. Moisture balance is appropriate. There is a little bit of slough overlying granulation tissue. 07/24/2023: The band of epithelium between the 2 open areas has gotten wider. The more proximal area has more epithelialization around the perimeter. Both have slough on the surface and remain fairly fibrotic. Moisture balance remains good. 08/07/2023: Both open areas are little bit smaller today. The band of epithelium between the 2 continues to enlarge. There is slough and eschar on both surfaces. 08/14/2023: No significant change to the wound dimensions. The surface is rather dry and fibrotic again. 08/21/2023: The wound is smaller and the skin bridge between the 2 open areas has gotten wider. There is slough accumulation on the surfaces and the moisture balance is improved. [name], [name] ([id number]) [id number] Page 9 of 13 08/28/2023: Both open areas of the wound are smaller today. There is slough on the surface, but it is quite soft. Moisture balance is good. 09/04/2023: The band of epithelium between the 2 open areas continues to widen. The wound is very sensitive today, more so than usual. No concern for infection based upon visual inspection, however. There is some slough on the wound surface, which remains fairly  fibrotic. 09/11/2023: Both open areas are little bit smaller today. There is slough accumulation on the surface. The underlying tissue is still somewhat fibrotic, but the color is good. Edema control is good. 09/17/2023: The wound continues to improve. The open areas are smaller and the band of skin has gotten wider. The tissue color is possibly the best that I have ever seen it although it does remain somewhat fibrotic. There is slough on the wound surface. 09/25/2023: The wound is smaller again today. The slough on the surface is rubbery but the underlying tissue continues to improve, quality-wise. It is less fibrotic today. Edema control is good. Patient History Information obtained from Patient. Family History Cancer - Mother,Paternal Grandparents, Diabetes - Siblings, Heart Disease - Father, Hypertension - Father. Social History Former smoker - quit in 1967. Medical History Eyes Patient has history of Cataracts - L eye Ear/Nose/Mouth/Throat Denies history of Chronic sinus problems/congestion, Middle ear problems Hematologic/Lymphatic Denies history of Anemia, Human Immunodeficiency Virus, Lymphedema Respiratory Patient has history of Asthma Cardiovascular Patient has history of Hypertension Endocrine Denies history of Type I Diabetes, Type II Diabetes Neurologic Patient  has history of Neuropathy - Feet and finger Denies history of Seizure Disorder Oncologic Patient has history of Received Chemotherapy - 2012, Received Radiation - 2012 Hospitalization/Surgery History - Lower extremity venography 2020. - Intravascular ultrasound. - peripheral vascular intervention. - melanoma 2011. 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 Respiratory Normal work of breathing on room air.. General Notes: 09/25/2023: The wound is smaller again today. The slough on the surface is rubbery but the underlying tissue continues to improve, quality- wise. It is less fibrotic today. Edema control is good. Integumentary (Hair, Skin) Wound #2 status is Open. Original cause of wound was Blister. The date acquired was: 07/06/2022. The wound has been in treatment 60 weeks. The wound is located on the Left,Posterior Lower Leg. The wound measures 3.5cm length x 1.4cm width x 0.2cm depth; 3.848cm^2 area and 0.77cm^3 volume. There is Fat Layer (Subcutaneous Tissue) exposed. There is no tunneling or undermining noted. There is a medium amount of serosanguineous drainage noted. The wound margin is distinct with the outline attached to the wound base. There is large (67-100%) red granulation within the wound bed. There is a small (1-33%) amount of necrotic tissue within the wound bed including Eschar and Adherent Slough. The periwound skin appearance had no abnormalities noted for texture. The periwound skin appearance had no abnormalities noted for moisture. The periwound skin appearance exhibited: Rubor. Periwound temperature was noted as No Abnormality. The periwound has tenderness on palpation. [name], [name] ([id number]) [id number] Page 10 of 13 Assessment Active Problems ICD-10 Non-pressure chronic ulcer of other part of left lower leg with fat layer exposed Varicose veins of bilateral lower extremities with other complications Venous insufficiency (chronic) (peripheral) Polyneuropathy, unspecified Localized edema Procedures Wound #2 Pre-procedure diagnosis of Wound #2 is a Venous Leg Ulcer located on the Left,Posterior Lower Leg .Severity of Tissue Pre  Debridement is: Fat layer exposed. There was a Excisional Skin/Subcutaneous Tissue Debridement with a total area of 3.85 sq cm performed by Duanne Guess, MD. With the following instrument(s): Curette to remove Non-Viable tissue/material. Material removed includes Subcutaneous Tissue and Slough and after achieving pain control using Lidocaine 4% T opical Solution. No specimens were taken. A time out was conducted at 15:03, prior to the start of the procedure. A Minimum amount of bleeding was controlled with Pressure. The procedure was tolerated well. Post Debridement Measurements: 3.5cm length x 1.4cm width x 0.2cm depth; 0.77cm^3 volume. Character of Wound/Ulcer Post Debridement is improved. Severity of Tissue Post Debridement is: Fat layer exposed. Post procedure Diagnosis Wound #2: Same as Pre-Procedure Pre-procedure diagnosis of Wound #2 is a Venous Leg Ulcer located on the Left,Posterior Lower Leg . There was a Double Layer Compression Therapy Procedure by Samuella Bruin, RN. Post procedure Diagnosis Wound #2: Same as Pre-Procedure There was a Three Layer Compression Therapy Procedure by Samuella Bruin, RN. Post procedure Diagnosis Wound #: Same as Pre-Procedure Plan Follow-up Appointments: Return Appointment in 1 week. - Dr. Lady Gary Room 3 Anesthetic: Wound #2 Left,Posterior Lower Leg: (In clinic) Topical Lidocaine 4% applied to wound bed Bathing/ Shower/ Hygiene: May shower with protection but do not get wound dressing(s) wet. Protect dressing(s) with water repellant cover (for example, large plastic bag)  or a cast cover and may then take shower. Edema Control - Orders / Instructions: Other Edema Control Orders/Instructions: - Right Lymphadema non-wound Coflex w/Calamine Additional Orders / Instructions: Other: - Recommend Nail Care The following medication(s) was prescribed: lidocaine topical 4 % cream cream topical was prescribed at facility WOUND #2: - Lower Leg Wound  Laterality: Left, Posterior Cleanser: Soap and Water 1 x Per Week/30 Days Discharge Instructions: May shower and wash wound with dial antibacterial soap and water prior to dressing change. Peri-Wound Care: Ketoconazole Cream 2% 1 x Per Week/30 Days Discharge Instructions: Apply Ketoconazole as directed Peri-Wound Care: Triamcinolone 15 (g) 1 x Per Week/30 Days Discharge Instructions: Use triamcinolone 15 (g) as directed Peri-Wound Care: Zinc Oxide Ointment 30g tube 1 x Per Week/30 Days Discharge Instructions: Apply Zinc Oxide to periwound with each dressing change Peri-Wound Care: Sween Lotion (Moisturizing lotion) 1 x Per Week/30 Days Discharge Instructions: Apply moisturizing lotion as directed Topical: Gentamicin 1 x Per Week/30 Days Discharge Instructions: As directed by physician Topical: Mupirocin Ointment 1 x Per Week/30 Days Discharge Instructions: Apply Mupirocin (Bactroban) as instructed Topical: Skintegrity Hydrogel 4 (oz) 1 x Per Week/30 Days Discharge Instructions: Apply hydrogel as directed Prim Dressing: Promogran Prisma Matrix, 4.34 (sq in) (silver collagen) 1 x Per Week/30 Days ary Discharge Instructions: Moisten collagen with saline or hydrogel Secondary Dressing: Optifoam Non-Adhesive Dressing, 4x4 in 1 x Per Week/30 Days Discharge Instructions: Apply over primary dressing as directed. Secondary Dressing: Zetuvit Plus 4x8 in 1 x Per Week/30 Days Discharge Instructions: Apply over primary dressing as directed. Com pression Wrap: CoFlex Calamine Unna Boot 4 x 6 (in/yd) 1 x Per Week/30 Days Discharge Instructions: Apply Coflex Calamine [employer] as directed. [name], FROGGE ([id number]) [id number] Page 11 of 13 09/25/2023: The wound is smaller again today. The slough on the surface is rubbery but the underlying tissue continues to improve, quality-wise. It is less fibrotic today. Edema control is good. I used a curette to debride slough and  subcutaneous tissue from her wound. We will continue the mixture of topical gentamicin and mupirocin with Prisma silver collagen moistened with hydrogel and Optifoam covering to retain moisture. Continue calamine Coflex wraps bilaterally. Follow-up in 1 week. Electronic Signature(s) Signed: 09/25/2023 3:37:45 PM By: Duanne Guess MD FACS Entered By: Duanne Guess on 09/25/2023 12:37:44 -------------------------------------------------------------------------------- HxROS Details Patient Name: Date of Service: SHELLYANN, VEENSTRA 09/25/2023 2:30 PM Medical Record Number: [id number] Patient Account Number: [id number] Date of Birth/Sex: Treating RN: Dec 19, 1945 (77 y.o. F) Primary Care Provider: Eustaquio Boyden Other Clinician: Referring Provider: Treating Provider/Extender: Priscille Loveless in Treatment: 60 Information Obtained From Patient Eyes Medical History: Positive for: Cataracts - L eye Ear/Nose/Mouth/Throat Medical History: Negative for: Chronic sinus problems/congestion; Middle ear problems Hematologic/Lymphatic Medical History: Negative for: Anemia; Human Immunodeficiency Virus; Lymphedema Respiratory Medical History: Positive for: Asthma Past Medical History Notes: OSA on CPAP Cardiovascular Medical History: Positive for: Hypertension Past Medical History Notes: Cronic venous insufficiency 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 Gastrointestinal Medical History: Past Medical History Notes: liver cyst Endocrine Medical History: Negative for: Type I Diabetes; Type II Diabetes Musculoskeletal Medical History: Past Medical History NotesCINDY-LOU, ABRAMO ([id number]) [id number] Page 12 of 13 arthritis Neurologic Medical History: Positive for: Neuropathy - Feet and finger Negative for: Seizure Disorder Past Medical History Notes: restless leg syndrome Oncologic Medical  History: Positive for: Received Chemotherapy - 2012; Received Radiation - 2012 HBO Extended History Items Eyes: Cataracts Immunizations Pneumococcal Vaccine: Received Pneumococcal Vaccination: Yes  Received Pneumococcal Vaccination On or After 60th Birthday: Yes Implantable Devices No devices added Hospitalization / Surgery History Type of Hospitalization/Surgery Lower extremity venography 2020 Intravascular ultrasound peripheral vascular intervention melanoma 2011 Family and Social History Cancer: Yes - Mother,Paternal Grandparents; Diabetes: Yes - Siblings; Heart Disease: Yes - Father; Hypertension: Yes - Father; Former smoker - quit in [employer]) Signed: 09/25/2023 3:57:12 PM By: Duanne Guess MD FACS Entered By: Duanne Guess on 09/25/2023 12:34:49 -------------------------------------------------------------------------------- SuperBill Details Patient Name: Date of Service: BECKA, HIPKE 09/25/2023 Medical Record Number: [id number] Patient Account Number: [id number] Date of Birth/Sex: Treating RN: 24-Mar-1946 (77 y.o. F) Primary Care Provider: Eustaquio Boyden Other Clinician: Referring Provider: Treating Provider/Extender: Priscille Loveless in Treatment: 60 Diagnosis Coding ICD-10 Codes Code Description [phone] Non-pressure chronic ulcer of other part of left lower leg with fat layer exposed I83.893 Varicose veins of bilateral lower extremities with other complications I87.2 Venous insufficiency (chronic) (peripheral) G62.9 Polyneuropathy, unspecified R60.0 Localized edema Facility Procedures : KAMILYA, DITORO [facility] [employer] ([id number]) [id number] ICD L Description: [phone] 42 - DEB SUBQ TISSUE 20 SQ CM/< -10 Diagnosis Description 97.822 Non-pressure chronic ulcer of other part of left lower leg with fat layer expo Modifier: [id number] 1 sed Quantity: .pdf Page 13 of 13 Physician Procedures : CPT4  Code Description Modifier [id number] - WC PHYS LEVEL 4 - EST PT ICD-10 Diagnosis Description L97.822 Non-pressure chronic ulcer of other part of left lower leg with fat layer exposed I83.893 Varicose veins of bilateral lower extremities with  other complications I87.2 Venous insufficiency (chronic) (peripheral) R60.0 Localized edema Quantity: 1 : [id number] - WC PHYS SUBQ TISS 20 SQ CM ICD-10 Diagnosis Description L97.822 Non-pressure chronic ulcer of other part of left lower leg with fat layer exposed Quantity: 1 Electronic Signature(s) Signed: 09/25/2023 3:38:01 PM By: Duanne Guess MD FACS Entered By: Duanne Guess on 09/25/2023 12:38:00

## 2023-09-25 NOTE — Progress Notes (Signed)
Amber, [name] ([id number]) [id number] Page 1 of 9 Visit Report for 09/25/2023 Arrival Information Details Patient Name: Date of Service: [name], [name] 09/25/2023 2:30 PM Medical Record Number: [id number] Patient Account Number: [id number] Date of Birth/Sex: Treating RN: 1946-02-04 (77 y.o. F) Primary Care Jeromy Borcherding: Eustaquio Boyden Other Clinician: Referring Mandrell Vangilder: Treating Kytzia Gienger/Extender: Priscille Loveless in Treatment: 60 Visit Information History Since Last Visit Added or deleted any medications: No Patient Arrived: Ambulatory Any new allergies or adverse reactions: No Arrival Time: 14:42 Had a fall or experienced change in No Accompanied By: self activities of daily living that may affect Transfer Assistance: None risk of falls: Patient Identification Verified: Yes Signs or symptoms of abuse/neglect since last visito No Secondary Verification Process Completed: Yes Hospitalized since last visit: No Patient Requires Transmission-Based Precautions: No Implantable device outside of the clinic excluding No Patient Has Alerts: No cellular tissue based products placed in the center since last visit: Pain Present Now: No Electronic Signature(s) Signed: 09/25/2023 4:26:18 PM By: Dayton Scrape Entered By: Dayton Scrape on 09/25/2023 11:42:35 -------------------------------------------------------------------------------- Compression Therapy Details Patient Name: Date of Service: [name], [name] 09/25/2023 2:30 PM Medical Record Number: [id number] Patient Account Number: [id number] Date of Birth/Sex: Treating RN: 1946-02-11 (77 y.o. [name] Primary Care Lorina Duffner: Eustaquio Boyden Other Clinician: Referring Milledge Gerding: Treating Zyeir Dymek/Extender: Priscille Loveless in Treatment: 60 Compression Therapy Performed for Wound Assessment: Wound #2 Left,Posterior Lower Leg Performed By: Clinician  Samuella Bruin, RN Compression Type: Double Layer Post Procedure Diagnosis Same as Pre-procedure Electronic Signature(s) Signed: 09/25/2023 3:24:43 PM By: Samuella Bruin Entered By: Samuella Bruin on 09/25/2023 12:02:48 Compression Therapy Details -------------------------------------------------------------------------------- [name] ([id number]) [id number] Page 2 of 9 Patient Name: Date of Service: [name], [name] 09/25/2023 2:30 PM Medical Record Number: [id number] Patient Account Number: [id number] Date of Birth/Sex: Treating RN: 1946/02/25 (77 y.o. [name] Primary Care Malcolm Quast: Eustaquio Boyden Other Clinician: Referring Shashana Fullington: Treating Christinamarie Tall/Extender: Priscille Loveless in Treatment: 60 Compression Therapy Performed for Wound Assessment: NonWound Condition Lymphedema - Right Leg Performed By: Clinician Samuella Bruin, RN Compression Type: Three Layer Post Procedure Diagnosis Same as Pre-procedure Electronic Signature(s) Signed: 09/25/2023 3:24:43 PM By: Samuella Bruin Entered By: Samuella Bruin on 09/25/2023 12:05:50 -------------------------------------------------------------------------------- Encounter Discharge Information Details Patient Name: Date of Service: [name], [name] 09/25/2023 2:30 PM Medical Record Number: [id number] Patient Account Number: [id number] Date of Birth/Sex: Treating RN: 1946/04/16 (77 y.o. [name] Primary Care [name]: Eustaquio Boyden Other Clinician: Referring Kelicia Youtz: Treating Aryon Nham/Extender: Priscille Loveless in Treatment: 60 Encounter Discharge Information Items Post Procedure Vitals Discharge Condition: Stable Temperature (F): 97.8 Ambulatory Status: Ambulatory Pulse (bpm): 72 Discharge Destination: Home Respiratory Rate (breaths/min): 18 Transportation: Private Auto Blood Pressure (mmHg):  121/65 Accompanied By: self Schedule Follow-up Appointment: Yes Clinical Summary of Care: Patient Declined Electronic Signature(s) Signed: 09/25/2023 3:24:43 PM By: Samuella Bruin Entered By: Samuella Bruin on 09/25/2023 12:24:30 -------------------------------------------------------------------------------- Lower Extremity Assessment Details Patient Name: Date of Service: [name], [name] 09/25/2023 2:30 PM Medical Record Number: [id number] Patient Account Number: [id number] Date of Birth/Sex: Treating RN: Mar 26, 1946 (77 y.o. [name] Primary Care Carlye Panameno: Eustaquio Boyden Other Clinician: Referring Delynn Pursley: Treating Albeiro Trompeter/Extender: Katharina Caper Weeks in Treatment: 60 Edema Assessment Assessed: [Left: No] [Right: No] Edema: [Left: Ye] [Right: s] Calf Left: Right: Point of Measurement: 31 cm From Medial Instep 39.5 cm 40.1 cm TELIYAH, [name] ([id number]) [id number] Page 3 of 9 Ankle Left: Right: Point of Measurement: 9  cm From Medial Instep 23 cm 24 cm Vascular Assessment Extremity colors, hair growth, and conditions: Extremity Color: [Left:Normal] Hair Growth on Extremity: [Left:Yes] Temperature of Extremity: [Left:Warm] Capillary Refill: [Left:< 3 seconds] Dependent Rubor: [Left:No No] Electronic Signature(s) Signed: 09/25/2023 3:00:39 PM By: Tommie Ard RN Entered By: Tommie Ard on 09/25/2023 12:00:39 -------------------------------------------------------------------------------- Multi Wound Chart Details Patient Name: Date of Service: [name] 09/25/2023 2:30 PM Medical Record Number: [id number] Patient Account Number: [id number] Date of Birth/Sex: Treating RN: [birth year] (77 y.o. F) Primary Care Melayna Robarts: Eustaquio Boyden Other Clinician: Referring Kyleigha Markert: Treating Wong Steadham/Extender: Priscille Loveless in Treatment: 60 Vital Signs Height(in): 63 Pulse(bpm):  72 Weight(lbs): 200 Blood Pressure(mmHg): 121/65 Body Mass Index(BMI): 35.4 Temperature(F): 97.8 Respiratory Rate(breaths/min): 18 [2:Photos:] [N/A:N/A] Left, Posterior Lower Leg N/A N/A Wound Location: Blister N/A N/A Wounding Event: Venous Leg Ulcer N/A N/A Primary Etiology: Cataracts, Asthma, Hypertension, N/A N/A Comorbid History: Neuropathy, Received Chemotherapy, Received Radiation 07/06/2022 N/A N/A Date Acquired: 52 N/A N/A Weeks of Treatment: Open N/A N/A Wound Status: No N/A N/A Wound Recurrence: 3.5x1.4x0.2 N/A N/A Measurements L x W x D (cm) 3.848 N/A N/A A (cm) : rea 0.77 N/A N/A Volume (cm) : 44.00% N/A N/A % Reduction in Area: -12.10% N/A N/A % Reduction in Volume: Full Thickness Without Exposed N/A N/A Classification: Support Structures Medium N/A N/A Exudate Amount: Serosanguineous N/A N/A Exudate Type: red, brown N/A N/A Exudate Color: Distinct, outline attached N/A N/A Wound Margin: Large (67-100%) N/A N/A Granulation Amount: INELLA, [name] ([id number]) [id number] Page 4 of 9 Red N/A N/A Granulation Quality: Small (1-33%) N/A N/A Necrotic Amount: Eschar, Adherent Slough N/A N/A Necrotic Tissue: Fat Layer (Subcutaneous Tissue): Yes N/A N/A Exposed Structures: Fascia: No Tendon: No Muscle: No Joint: No Bone: No Small (1-33%) N/A N/A Epithelialization: Debridement - Excisional N/A N/A Debridement: Pre-procedure Verification/Time Out 15:03 N/A N/A Taken: Lidocaine 4% Topical Solution N/A N/A Pain Control: Subcutaneous, Slough N/A N/A Tissue Debrided: Skin/Subcutaneous Tissue N/A N/A Level: 3.85 N/A N/A Debridement A (sq cm): rea Curette N/A N/A Instrument: Minimum N/A N/A Bleeding: Pressure N/A N/A Hemostasis A chieved: Procedure was tolerated well N/A N/A Debridement Treatment Response: 3.5x1.4x0.2 N/A N/A Post Debridement Measurements L x W x D (cm) 0.77 N/A N/A Post Debridement Volume:  (cm) Scarring: Yes N/A N/A Periwound Skin Texture: Maceration: No N/A N/A Periwound Skin Moisture: Dry/Scaly: No Rubor: Yes N/A N/A Periwound Skin Color: No Abnormality N/A N/A Temperature: Yes N/A N/A Tenderness on Palpation: Compression Therapy N/A N/A Procedures Performed: Debridement Treatment Notes Wound #2 (Lower Leg) Wound Laterality: Left, Posterior Cleanser Soap and Water Discharge Instruction: May shower and wash wound with dial antibacterial soap and water prior to dressing change. Peri-Wound Care Ketoconazole Cream 2% Discharge Instruction: Apply Ketoconazole as directed Triamcinolone 15 (g) Discharge Instruction: Use triamcinolone 15 (g) as directed Zinc Oxide Ointment 30g tube Discharge Instruction: Apply Zinc Oxide to periwound with each dressing change Sween Lotion (Moisturizing lotion) Discharge Instruction: Apply moisturizing lotion as directed Topical Gentamicin Discharge Instruction: As directed by physician Mupirocin Ointment Discharge Instruction: Apply Mupirocin (Bactroban) as instructed Skintegrity Hydrogel 4 (oz) Discharge Instruction: Apply hydrogel as directed Primary Dressing Promogran Prisma Matrix, 4.34 (sq in) (silver collagen) Discharge Instruction: Moisten collagen with saline or hydrogel Secondary Dressing Optifoam Non-Adhesive Dressing, 4x4 in Discharge Instruction: Apply over primary dressing as directed. Zetuvit Plus 4x8 in Discharge Instruction: Apply over primary dressing as directed. Secured With Compression Wrap CoFlex Calamine Unna Boot 4 x 6 (in/yd) Discharge Instruction: Apply Coflex  Calamine [employer] as directed. Compression Stockings TARYAH, SHIFLETT ([id number]) [id number] Page 5 of 9 Add-Ons Electronic Signature(s) Signed: 09/25/2023 3:33:46 PM By: Duanne Guess MD FACS Entered By: Duanne Guess on 09/25/2023  12:33:46 -------------------------------------------------------------------------------- Multi-Disciplinary Care Plan Details Patient Name: Date of Service: ENYIA, [name] 09/25/2023 2:30 PM Medical Record Number: [id number] Patient Account Number: [id number] Date of Birth/Sex: Treating RN: [birth year] (77 y.o. [name] Primary Care Abbye Lao: Eustaquio Boyden Other Clinician: Referring Hawraa Stambaugh: Treating Erhardt Dada/Extender: Priscille Loveless in Treatment: 60 Multidisciplinary Care Plan reviewed with physician Active Inactive Venous Leg Ulcer Nursing Diagnoses: Knowledge deficit related to disease process and management Potential for venous Insuffiency (use before diagnosis confirmed) Goals: Patient will maintain optimal edema control Date Initiated: 02/07/2023 Target Resolution Date: 10/06/2023 Goal Status: Active Interventions: Assess peripheral edema status every visit. Compression as ordered Treatment Activities: Therapeutic compression applied : 02/07/2023 Notes: Wound/Skin Impairment Nursing Diagnoses: Impaired tissue integrity Goals: Ulcer/skin breakdown will have a volume reduction of 30% by week 4 Date Initiated: 08/03/2022 Date Inactivated: 09/10/2022 Target Resolution Date: 08/31/2022 Unmet Reason: uncontrolled edema, Goal Status: Unmet acute infection Ulcer/skin breakdown will have a volume reduction of 50% by week 8 Date Initiated: 09/10/2022 Target Resolution Date: 10/06/2023 Goal Status: Active Interventions: Assess ulceration(s) every visit Treatment Activities: Skin care regimen initiated : 08/03/2022 Notes: [place] ordered. 10/30 RX Levo started. 09/10/22 Electronic Signature(s) Signed: 09/25/2023 3:24:43 PM By: Samuella Bruin Entered By: Samuella Bruin on 09/25/2023 12:23:12 [name] ([id number]) [id number] Page 6 of  9 -------------------------------------------------------------------------------- Pain Assessment Details Patient Name: Date of Service: [name], [name] 09/25/2023 2:30 PM Medical Record Number: [id number] Patient Account Number: [id number] Date of Birth/Sex: Treating RN: 1946/05/31 (77 y.o. F) Primary Care Daxx Tiggs: Eustaquio Boyden Other Clinician: Referring Analeia Ismael: Treating Emelin Dascenzo/Extender: Priscille Loveless in Treatment: 60 Active Problems Location of Pain Severity and Description of Pain Patient Has Paino No Site Locations Pain Management and Medication Current Pain Management: Electronic Signature(s) Signed: 09/25/2023 4:26:18 PM By: Dayton Scrape Entered By: Dayton Scrape on 09/25/2023 11:43:11 -------------------------------------------------------------------------------- Patient/Caregiver Education Details Patient Name: Date of Service: JENETTE, DONALSON 11/20/2024andnbsp2:30 PM Medical Record Number: [id number] Patient Account Number: [id number] Date of Birth/Gender: Treating RN: May 30, 1946 (77 y.o. [name] Primary Care Physician: Eustaquio Boyden Other Clinician: Referring Physician: Treating Physician/Extender: Priscille Loveless in Treatment: 62 Education Assessment Education Provided To: Patient Education Topics Provided Wound/Skin Impairment: Methods: Explain/Verbal JANIQUA, CABAZOS ([id number]) [phone].pdf Page 7 of 9 Responses: Reinforcements needed, State content correctly Electronic Signature(s) Signed: 09/25/2023 3:24:43 PM By: Samuella Bruin Entered By: Samuella Bruin on 09/25/2023 12:23:47 -------------------------------------------------------------------------------- Wound Assessment Details Patient Name: Date of Service: TUWANA, LOFTHOUSE 09/25/2023 2:30 PM Medical Record Number: [id number] Patient Account Number: [id number] Date of Birth/Sex: Treating  RN: 24-[name]-1946 (77 y.o. F) Primary Care Jeannia Tatro: Eustaquio Boyden Other Clinician: Referring Charae Depaolis: Treating Rodnisha Blomgren/Extender: Priscille Loveless in Treatment: 60 Wound Status Wound Number: 2 Primary Venous Leg Ulcer Etiology: Wound Location: Left, Posterior Lower Leg Wound Open Wounding Event: Blister Status: Date Acquired: 07/06/2022 Comorbid Cataracts, Asthma, Hypertension, Neuropathy, Received Weeks Of Treatment: 60 History: Chemotherapy, Received Radiation Clustered Wound: No Photos Wound Measurements Length: (cm) 3.5 Width: (cm) 1.4 Depth: (cm) 0.2 Area: (cm) 3.848 Volume: (cm) 0.77 % Reduction in Area: 44% % Reduction in Volume: -12.1% Epithelialization: Small (1-33%) Tunneling: No Undermining: No Wound Description Classification: Full Thickness Without Exposed Support Structures Wound Margin: Distinct, outline attached Exudate Amount: Medium Exudate Type: Serosanguineous Exudate Color: red,  brown Foul Odor After Cleansing: No Slough/Fibrino Yes Wound Bed Granulation Amount: Large (67-100%) Exposed Structure Granulation Quality: Red Fascia Exposed: No Necrotic Amount: Small (1-33%) Fat Layer (Subcutaneous Tissue) Exposed: Yes Necrotic Quality: Eschar, Adherent Slough Tendon Exposed: No Muscle Exposed: No Joint Exposed: No Bone Exposed: No Periwound Skin Texture Texture Color No Abnormalities Noted: Yes No Abnormalities Noted: No Rubor[address] SI, FORBES ([id number]) [phone].pdf Page 8 of 9 Rubor: Yes Moisture No Abnormalities Noted: Yes Temperature / Pain Temperature: No Abnormality Tenderness on Palpation: Yes Treatment Notes Wound #2 (Lower Leg) Wound Laterality: Left, Posterior Cleanser Soap and Water Discharge Instruction: May shower and wash wound with dial antibacterial soap and water prior to dressing change. Peri-Wound Care Ketoconazole Cream 2% Discharge Instruction: Apply  Ketoconazole as directed Triamcinolone 15 (g) Discharge Instruction: Use triamcinolone 15 (g) as directed Zinc Oxide Ointment 30g tube Discharge Instruction: Apply Zinc Oxide to periwound with each dressing change Sween Lotion (Moisturizing lotion) Discharge Instruction: Apply moisturizing lotion as directed Topical Gentamicin Discharge Instruction: As directed by physician Mupirocin Ointment Discharge Instruction: Apply Mupirocin (Bactroban) as instructed Skintegrity Hydrogel 4 (oz) Discharge Instruction: Apply hydrogel as directed Primary Dressing Promogran Prisma Matrix, 4.34 (sq in) (silver collagen) Discharge Instruction: Moisten collagen with saline or hydrogel Secondary Dressing Optifoam Non-Adhesive Dressing, 4x4 in Discharge Instruction: Apply over primary dressing as directed. Zetuvit Plus 4x8 in Discharge Instruction: Apply over primary dressing as directed. Secured With Compression Wrap CoFlex Calamine Unna Boot 4 x 6 (in/yd) Discharge Instruction: Apply Coflex Calamine [employer] as directed. Compression Stockings Add-Ons Electronic Signature(s) Signed: 09/25/2023 3:00:52 PM By: Tommie Ard RN Entered By: Tommie Ard on 09/25/2023 12:00:51 -------------------------------------------------------------------------------- Vitals Details Patient Name: Date of Service: ZHANNA, BAAL 09/25/2023 2:30 PM Medical Record Number: [id number] Patient Account Number: [id number] Date of Birth/Sex: Treating RN: 1946/07/11 (77 y.o. F) Primary Care Hartwell Vandiver: Eustaquio Boyden Other Clinician: Referring Jaclyn Carew: Treating Ellias Mcelreath/Extender: Priscille Loveless in Treatment: [id number] ILLA, CAVINDER J ([id number]) [id number] Page 9 of 9 Time Taken: 02:42 Temperature (F): 97.8 Height (in): 63 Pulse (bpm): 72 Weight (lbs): 200 Respiratory Rate (breaths/min): 18 Body Mass Index (BMI): 35.4 Blood Pressure (mmHg):  121/65 Reference Range: 80 - 120 mg / dl Electronic Signature(s) Signed: 09/25/2023 4:26:18 PM By: Dayton Scrape Entered By: Dayton Scrape on 09/25/2023 11:43:05

## 2023-10-01 ENCOUNTER — Encounter (HOSPITAL_BASED_OUTPATIENT_CLINIC_OR_DEPARTMENT_OTHER): Payer: Medicare Other | Admitting: General Surgery

## 2023-10-01 DIAGNOSIS — L97818 Non-pressure chronic ulcer of other part of right lower leg with other specified severity: Secondary | ICD-10-CM | POA: Diagnosis not present

## 2023-10-01 DIAGNOSIS — I872 Venous insufficiency (chronic) (peripheral): Secondary | ICD-10-CM | POA: Diagnosis not present

## 2023-10-01 DIAGNOSIS — L97222 Non-pressure chronic ulcer of left calf with fat layer exposed: Secondary | ICD-10-CM | POA: Diagnosis not present

## 2023-10-01 DIAGNOSIS — I10 Essential (primary) hypertension: Secondary | ICD-10-CM | POA: Diagnosis not present

## 2023-10-01 DIAGNOSIS — G4733 Obstructive sleep apnea (adult) (pediatric): Secondary | ICD-10-CM | POA: Diagnosis not present

## 2023-10-01 DIAGNOSIS — I890 Lymphedema, not elsewhere classified: Secondary | ICD-10-CM | POA: Diagnosis not present

## 2023-10-01 DIAGNOSIS — L97212 Non-pressure chronic ulcer of right calf with fat layer exposed: Secondary | ICD-10-CM | POA: Diagnosis not present

## 2023-10-01 DIAGNOSIS — L97822 Non-pressure chronic ulcer of other part of left lower leg with fat layer exposed: Secondary | ICD-10-CM | POA: Diagnosis not present

## 2023-10-01 DIAGNOSIS — G629 Polyneuropathy, unspecified: Secondary | ICD-10-CM | POA: Diagnosis not present

## 2023-10-02 NOTE — Progress Notes (Signed)
Amber, [name] ([id number]) [id number] Page 1 of 11 Visit Report for 10/01/2023 Arrival Information Details Patient Name: Date of Service: Amber, [name] 10/01/2023 2:45 PM Medical Record Number: [id number] Patient Account Number: [id number] Date of Birth/Sex: Treating RN: [birth year] (77 y.o. [name], [name] Primary Care [name]: [name] Other Clinician: Referring [name]: Treating Symeon Puleo/Extender: [name] in Treatment: 63 Visit Information History Since Last Visit Added or deleted any medications: No Patient Arrived: Ambulatory Any new allergies or adverse reactions: No Arrival Time: 14:44 Had a fall or experienced change in No Accompanied By: self activities of daily living that may affect Transfer Assistance: None risk of falls: Patient Identification Verified: Yes Signs or symptoms of abuse/neglect since last visito No Secondary Verification Process Completed: Yes Hospitalized since last visit: No Patient Requires Transmission-Based Precautions: No Implantable device outside of the clinic excluding No Patient Has Alerts: No cellular tissue based products placed in the center since last visit: Has Dressing in Place as Prescribed: Yes Has Compression in Place as Prescribed: Yes Pain Present Now: No Electronic Signature(s) Signed: 10/01/2023 5:13:28 PM By: [name] Entered By: [name] on 10/01/2023 14:48:51 -------------------------------------------------------------------------------- Compression Therapy Details Patient Name: Date of Service: [name] 10/01/2023 2:45 PM Medical Record Number: [id number] Patient Account Number: [id number] Date of Birth/Sex: Treating RN: [name] (77 y.o. [name] Primary Care Shirah Roseman: [name] Other Clinician: Referring Markeesha Char: Treating Brelyn Woehl/Extender: [name] in  Treatment: 64 Compression Therapy Performed for Wound Assessment: Wound #2 Left,Posterior Lower Leg Performed By: Clinician Amber Deed, RN Compression Type: Henriette Combs Post Procedure Diagnosis Same as Pre-procedure Electronic Signature(s) Signed: 10/01/2023 5:13:28 PM By: [name] Entered By: [name] on 10/01/2023 15:15:47 [name] ([id number]) [id number] Page 2 of 11 -------------------------------------------------------------------------------- Compression Therapy Details Patient Name: Date of Service: Amber, [name] 10/01/2023 2:45 PM Medical Record Number: [id number] Patient Account Number: [id number] Date of Birth/Sex: Treating RN: [birth year] (77 y.o. [name] Primary Care Sena Hoopingarner: [name] Other Clinician: Referring Fate Caster: Treating Avaree Gilberti/Extender: [name] in Treatment: 02 Compression Therapy Performed for Wound Assessment: Wound #6 Right,Posterior Lower Leg Performed By: Clinician Amber Deed, RN Compression Type: Henriette Combs Post Procedure Diagnosis Same as Pre-procedure Electronic Signature(s) Signed: 10/01/2023 5:13:28 PM By: [name] Entered By: [name] on 10/01/2023 15:15:47 -------------------------------------------------------------------------------- Encounter Discharge Information Details Patient Name: Date of Service: Amber, [name] 10/01/2023 2:45 PM Medical Record Number: [id number] Patient Account Number: [id number] Date of Birth/Sex: Treating RN: [birth year] (77 y.o. [name] Primary Care Librada Castronovo: [name] Other Clinician: Referring Cherilyn Sautter: Treating Derell Bruun/Extender: [name] in Treatment: 7 Encounter Discharge Information Items Post Procedure Vitals Discharge Condition: Stable Temperature (F): 97.8 Ambulatory Status: Ambulatory Pulse (bpm): 74 Discharge  Destination: Home Respiratory Rate (breaths/min): 18 Transportation: Private Auto Blood Pressure (mmHg): 145/75 Accompanied By: self Schedule Follow-up Appointment: Yes Clinical Summary of Care: Patient Declined Electronic Signature(s) Signed: 10/01/2023 5:13:28 PM By: [name] Entered By: [name] on 10/01/2023 15:37:40 -------------------------------------------------------------------------------- Lower Extremity Assessment Details Patient Name: Date of Service: Amber, [name] 10/01/2023 2:45 PM Medical Record Number: [id number] Patient Account Number: [id number] Date of Birth/Sex: Treating RN: [birth year] (77 y.o. [name] Primary Care Kazumi Lachney: [name] Other Clinician: Referring Havard Radigan: Treating Kimberlie Csaszar/Extender: Katharina Caper Weeks in Treatment: 61 Edema Assessment Assessed: [Left: No] [Right: No] Edema: [Left: Ye] [Right: s] [name], [name] ([id number]) [id number] Page 3  of 11 Left: Right: Point of Measurement: 31 cm From Medial Instep 37 cm 37.5 cm Ankle Left: Right: Point of Measurement: 9 cm From Medial Instep 23 cm 22.5 cm Vascular Assessment Pulses: Dorsalis Pedis Palpable: [Left:Yes] [Right:Yes] Extremity colors, hair growth, and conditions: Extremity Color: [Left:Normal] [Right:Hyperpigmented] Hair Growth on Extremity: [Left:Yes] [Right:Yes] Temperature of Extremity: [Left:Warm] [Right:Warm] Capillary Refill: [Left:< 3 seconds] [Right:< 3 seconds] Dependent Rubor: [Left:No No] Electronic Signature(s) Signed: 10/01/2023 5:13:28 PM By: [name] Entered By: [name] on 10/01/2023 15:01:33 -------------------------------------------------------------------------------- Multi Wound Chart Details Patient Name: Date of Service: [name] 10/01/2023 2:45 PM Medical Record Number: [id number] Patient Account Number: [id number] Date of  Birth/Sex: Treating RN: [name] (76 y.o. F) Primary Care Martin Belling: [name] Other Clinician: Referring Marrie Chandra: Treating Zehava Turski/Extender: [name] in Treatment: 61 Vital Signs Height(in): 63 Pulse(bpm): 74 Weight(lbs): 200 Blood Pressure(mmHg): 145/75 Body Mass Index(BMI): 35.4 Temperature(F): 97.8 Respiratory Rate(breaths/min): 18 [2:Photos:] [N/A:N/A] Left, Posterior Lower Leg Right, Posterior Lower Leg N/A Wound Location: Blister Gradually Appeared N/A Wounding Event: Venous Leg Ulcer Lymphedema N/A Primary Etiology: Cataracts, Asthma, Hypertension, Cataracts, Asthma, Hypertension, N/A Comorbid History: Neuropathy, Received Chemotherapy, Neuropathy, Received Chemotherapy, Received Radiation Received Radiation 07/06/2022 10/01/2023 N/A Date Acquired: 61 0 N/A Weeks of Treatment: Open Open N/A Wound Status: No No N/A Wound Recurrence: Yes No N/A Clustered Wound: 2 N/A N/A Clustered Quantity: 3.7x1.6x0.1 0.9x0.5x0.1 N/A Measurements L x W x D (cm) 4.65 0.353 N/A A (cm) : rea 0.465 0.035 N/A Volume (cm) : 32.30% N/A N/A % Reduction in AreaTIARRA, KIOUS ([id number]) [id number] Page 4 of 11 32.30% N/A N/A % Reduction in Volume: Full Thickness Without Exposed Full Thickness Without Exposed N/A Classification: Support Structures Support Structures Medium Small N/A Exudate A mount: Serosanguineous Serosanguineous N/A Exudate Type: red, brown red, brown N/A Exudate Color: Distinct, outline attached Distinct, outline attached N/A Wound Margin: Medium (34-66%) Medium (34-66%) N/A Granulation A mount: Red Pink N/A Granulation Quality: Medium (34-66%) Medium (34-66%) N/A Necrotic A mount: Fat Layer (Subcutaneous Tissue): Yes Fat Layer (Subcutaneous Tissue): Yes N/A Exposed Structures: Fascia: No Fascia: No Tendon: No Tendon: No Muscle: No Muscle: No Joint: No Joint: No Bone:  No Bone: No Small (1-33%) Small (1-33%) N/A Epithelialization: Debridement - Excisional Debridement - Excisional N/A Debridement: Pre-procedure Verification/Time Out 15:15 15:15 N/A Taken: Lidocaine 4% Topical Solution Lidocaine 4% Topical Solution N/A Pain Control: Subcutaneous, Slough Subcutaneous, Slough N/A Tissue Debrided: Skin/Subcutaneous Tissue Skin/Subcutaneous Tissue N/A Level: 3.72 0.35 N/A Debridement A (sq cm): rea Curette Curette N/A Instrument: Minimum Minimum N/A Bleeding: Pressure Pressure N/A Hemostasis A chieved: 2 2 N/A Procedural Pain: 1 1 N/A Post Procedural Pain: Procedure was tolerated well Procedure was tolerated well N/A Debridement Treatment Response: 3.7x1.6x0.1 0.9x0.5x0.1 N/A Post Debridement Measurements L x W x D (cm) 0.465 0.035 N/A Post Debridement Volume: (cm) Scarring: Yes No Abnormalities Noted N/A Periwound Skin Texture: Maceration: No No Abnormalities Noted N/A Periwound Skin Moisture: Dry/Scaly: No Rubor: No Hemosiderin Staining: Yes N/A Periwound Skin Color: No Abnormality No Abnormality N/A Temperature: Yes Yes N/A Tenderness on Palpation: Compression Therapy Compression Therapy N/A Procedures Performed: Debridement Debridement Treatment Notes Wound #2 (Lower Leg) Wound Laterality: Left, Posterior Cleanser Soap and Water Discharge Instruction: May shower and wash wound with dial antibacterial soap and water prior to dressing change. Peri-Wound Care Ketoconazole Cream 2% Discharge Instruction: Apply Ketoconazole as directed Triamcinolone 15 (g) Discharge Instruction: Use triamcinolone 15 (g) as directed Zinc Oxide Ointment 30g tube Discharge Instruction:  Apply Zinc Oxide to periwound with each dressing change Sween Lotion (Moisturizing lotion) Discharge Instruction: Apply moisturizing lotion as directed Topical Gentamicin Discharge Instruction: As directed by physician Mupirocin Ointment Discharge Instruction:  Apply Mupirocin (Bactroban) as instructed Skintegrity Hydrogel 4 (oz) Discharge Instruction: Apply hydrogel as directed Primary Dressing Promogran Prisma Matrix, 4.34 (sq in) (silver collagen) Discharge Instruction: Moisten collagen with saline or hydrogel Secondary Dressing Optifoam Non-Adhesive Dressing, 4x4 in Discharge Instruction: Apply over primary dressing as directed. Secured With GRAZIELLA, ALTEMUS ([id number]) [id number] Page 5 of 11 Compression Wrap CoFlex Calamine Unna Boot 4 x 6 (in/yd) Discharge Instruction: Apply Coflex Calamine [employer] as directed. Compression Stockings Add-Ons Wound #6 (Lower Leg) Wound Laterality: Right, Posterior Cleanser Soap and Water Discharge Instruction: May shower and wash wound with dial antibacterial soap and water prior to dressing change. Peri-Wound Care Sween Lotion (Moisturizing lotion) Discharge Instruction: Apply moisturizing lotion as directed Topical Primary Dressing Promogran Prisma Matrix, 4.34 (sq in) (silver collagen) Discharge Instruction: Moisten collagen with saline or hydrogel Secondary Dressing Woven Gauze Sponge, Non-Sterile 4x4 in Discharge Instruction: Apply over primary dressing as directed. Secured With Compression Wrap CoFlex Calamine Unna Boot 4 x 6 (in/yd) Discharge Instruction: Apply Coflex Calamine [employer] as directed. Compression Stockings Add-Ons Electronic Signature(s) Signed: 10/01/2023 4:50:53 PM By: Duanne Guess MD FACS Entered By: Duanne Guess on 10/01/2023 16:50:53 -------------------------------------------------------------------------------- Multi-Disciplinary Care Plan Details Patient Name: Date of Service: Amber, [name] 10/01/2023 2:45 PM Medical Record Number: [id number] Patient Account Number: [id number] Date of Birth/Sex: Treating RN: [name] (77 y.o. [name] Primary Care Pauleen Goleman: [name] Other Clinician: Referring  Kawanda Drumheller: Treating Lexxie Winberg/Extender: [name] in Treatment: 23 Multidisciplinary Care Plan reviewed with physician Active Inactive Venous Leg Ulcer Nursing Diagnoses: Knowledge deficit related to disease process and management Potential for venous Insuffiency (use before diagnosis confirmed) Goals: Patient will maintain optimal edema control Date Initiated: 02/07/2023 Target Resolution Date: 10/06/2023 Goal Status: Active [name], SILLA ([id number]) [id number] Page 6 of 11 Interventions: Assess peripheral edema status every visit. Compression as ordered Treatment Activities: Therapeutic compression applied : 02/07/2023 Notes: Wound/Skin Impairment Nursing Diagnoses: Impaired tissue integrity Goals: Ulcer/skin breakdown will have a volume reduction of 30% by week 4 Date Initiated: 08/03/2022 Date Inactivated: 09/10/2022 Target Resolution Date: 08/31/2022 Unmet Reason: uncontrolled edema, Goal Status: Unmet acute infection Ulcer/skin breakdown will have a volume reduction of 50% by week 8 Date Initiated: 09/10/2022 Target Resolution Date: 10/06/2023 Goal Status: Active Interventions: Assess ulceration(s) every visit Treatment Activities: Skin care regimen initiated : 08/03/2022 Notes: [place]stone ordered. 10/30 RX Levo started. 09/10/22 Electronic Signature(s) Signed: 10/01/2023 5:13:28 PM By: [name] Entered By: [name] on 10/01/2023 15:09:29 -------------------------------------------------------------------------------- Pain Assessment Details Patient Name: Date of Service: Amber, [name] 10/01/2023 2:45 PM Medical Record Number: [id number] Patient Account Number: [id number] Date of Birth/Sex: Treating RN: [name] (77 y.o. [name] Primary Care Nardos Putnam: [name] Other Clinician: Referring Ulysses Alper: Treating Eletha Culbertson/Extender: [name] in  Treatment: 80 Active Problems Location of Pain Severity and Description of Pain Patient Has Paino No Site Locations Rate the pain. Current Pain Level: 0 Amber, [name] ([id number]) [id number] Page 7 of 11 Pain Management and Medication Current Pain Management: Electronic Signature(s) Signed: 10/01/2023 5:13:28 PM By: [name] Entered By: [name] on 10/01/2023 14:50:03 -------------------------------------------------------------------------------- Patient/Caregiver Education Details Patient Name: Date of Service: Amber, [name] 11/26/2024andnbsp2:45 PM Medical Record Number: [id number] Patient Account Number: [id number] Date of Birth/Gender: Treating RN: [name] (  77 y.o. [name] Primary Care Physician: [name] Other Clinician: Referring Physician: Treating Physician/Extender: [name] in Treatment: 58 Education Assessment Education Provided To: Patient Education Topics Provided Venous: Methods: Explain/Verbal Responses: Reinforcements needed, State content correctly Wound/Skin Impairment: Methods: Explain/Verbal Responses: Reinforcements needed, State content correctly Electronic Signature(s) Signed: 10/01/2023 5:13:28 PM By: [name] Entered By: [name] on 10/01/2023 15:09:51 -------------------------------------------------------------------------------- Wound Assessment Details Patient Name: Date of Service: Amber, [name] 10/01/2023 2:45 PM Medical Record Number: [id number] Patient Account Number: [id number] Date of Birth/Sex: Treating RN: [name] (77 y.o. [name] Primary Care Alanda Colton: [name] Other Clinician: Referring Tuere Nwosu: Treating Captain Blucher/Extender: Katharina Caper Weeks in Treatment: 61 Wound Status Wound Number: 2 Primary Venous Leg Ulcer Etiology: Wound Location: Left, Posterior  Lower Leg Wound Open Wounding Event: Blister Status: Date Acquired: 07/06/2022 Comorbid Cataracts, Asthma, Hypertension, Neuropathy, Received Weeks Of Treatment: 61 History: Chemotherapy, Received Radiation Clustered Wound: Yes Photos TONJA, MEDLEN ([id number]) [id number] Page 8 of 11 Wound Measurements Length: (cm) Width: (cm) Depth: (cm) Clustered Quantity: Area: (cm) Volume: (cm) 3.7 % Reduction in Area: 32.3% 1.6 % Reduction in Volume: 32.3% 0.1 Epithelialization: Small (1-33%) 2 Tunneling: No 4.65 Undermining: No 0.465 Wound Description Classification: Full Thickness Without Exposed Sup Wound Margin: Distinct, outline attached Exudate Amount: Medium Exudate Type: Serosanguineous Exudate Color: red, brown port Structures Foul Odor After Cleansing: No Slough/Fibrino Yes Wound Bed Granulation Amount: Medium (34-66%) Exposed Structure Granulation Quality: Red Fascia Exposed: No Necrotic Amount: Medium (34-66%) Fat Layer (Subcutaneous Tissue) Exposed: Yes Necrotic Quality: Adherent Slough Tendon Exposed: No Muscle Exposed: No Joint Exposed: No Bone Exposed: No Periwound Skin Texture Texture Color No Abnormalities Noted: Yes No Abnormalities Noted: No Rubor: No Moisture No Abnormalities Noted: Yes Temperature / Pain Temperature: No Abnormality Tenderness on Palpation: Yes Treatment Notes Wound #2 (Lower Leg) Wound Laterality: Left, Posterior Cleanser Soap and Water Discharge Instruction: May shower and wash wound with dial antibacterial soap and water prior to dressing change. Peri-Wound Care Ketoconazole Cream 2% Discharge Instruction: Apply Ketoconazole as directed Triamcinolone 15 (g) Discharge Instruction: Use triamcinolone 15 (g) as directed Zinc Oxide Ointment 30g tube Discharge Instruction: Apply Zinc Oxide to periwound with each dressing change Sween Lotion (Moisturizing lotion) Discharge Instruction: Apply moisturizing  lotion as directed Topical Gentamicin Discharge Instruction: As directed by physician Mupirocin Ointment Discharge Instruction: Apply Mupirocin (Bactroban) as instructed Skintegrity Hydrogel 4 (oz) Discharge Instruction: Apply hydrogel as directed GORDIE, WOOTTEN ([id number]) [id number] Page 9 of 11 Primary Dressing Promogran Prisma Matrix, 4.34 (sq in) (silver collagen) Discharge Instruction: Moisten collagen with saline or hydrogel Secondary Dressing Optifoam Non-Adhesive Dressing, 4x4 in Discharge Instruction: Apply over primary dressing as directed. Secured With Compression Wrap CoFlex Calamine Unna Boot 4 x 6 (in/yd) Discharge Instruction: Apply Coflex Calamine [employer] as directed. Compression Stockings Add-Ons Electronic Signature(s) Signed: 10/01/2023 5:13:28 PM By: [name] Entered By: [name] on 10/01/2023 15:08:25 -------------------------------------------------------------------------------- Wound Assessment Details Patient Name: Date of Service: Amber, [name] 10/01/2023 2:45 PM Medical Record Number: [id number] Patient Account Number: [id number] Date of Birth/Sex: Treating RN: [birth year] (77 y.o. [name] Primary Care Jamason Peckham: [name] Other Clinician: Referring Avy Barlett: Treating Merie Wulf/Extender: Katharina Caper Weeks in Treatment: 61 Wound Status Wound Number: 6 Primary Lymphedema Etiology: Wound Location: Right, Posterior Lower Leg Wound Open Wounding Event: Gradually Appeared Status: Date Acquired: 10/01/2023 Comorbid Cataracts, Asthma, Hypertension, Neuropathy, Received Weeks Of Treatment: 0 History: Chemotherapy, Received Radiation Clustered Wound:  No Photos Wound Measurements Length: (cm) 0.9 Width: (cm) 0.5 Depth: (cm) 0.1 Area: (cm) 0.353 Volume: (cm) 0.035 % Reduction in Area: % Reduction in Volume: Epithelialization: Small (1-33%) Tunneling:  No Undermining: No Wound Description Classification: Full Thickness Without Exposed Support Structures Wound Margin: Distinct, outline attached Exudate Amount: Small Exudate Type: Serosanguineous Exudate Color: red, brown Amber, [name] ([id number]) Wound Bed Granulation Amount: Medium (34-66%) Granulation Quality: Pink Necrotic Amount: Medium (34-66%) Necrotic Quality: Adherent Slough Foul Odor After Cleansing: No Slough/Fibrino Yes [id number] Page 10 of 11 Exposed Structure Fascia Exposed: No Fat Layer (Subcutaneous Tissue) Exposed: Yes Tendon Exposed: No Muscle Exposed: No Joint Exposed: No Bone Exposed: No Periwound Skin Texture Texture Color No Abnormalities Noted: Yes No Abnormalities Noted: No Hemosiderin Staining: Yes Moisture No Abnormalities Noted: Yes Temperature / Pain Temperature: No Abnormality Tenderness on Palpation: Yes Treatment Notes Wound #6 (Lower Leg) Wound Laterality: Right, Posterior Cleanser Soap and Water Discharge Instruction: May shower and wash wound with dial antibacterial soap and water prior to dressing change. Peri-Wound Care Sween Lotion (Moisturizing lotion) Discharge Instruction: Apply moisturizing lotion as directed Topical Primary Dressing Promogran Prisma Matrix, 4.34 (sq in) (silver collagen) Discharge Instruction: Moisten collagen with saline or hydrogel Secondary Dressing Woven Gauze Sponge, Non-Sterile 4x4 in Discharge Instruction: Apply over primary dressing as directed. Secured With Compression Wrap CoFlex Calamine Unna Boot 4 x 6 (in/yd) Discharge Instruction: Apply Coflex [employer]amine [employer] as directed. Compression Stockings Add-Ons Electronic Signature(s) Signed: 10/01/2023 5:13:28 PM By: [name] Entered By: [name] on 10/01/2023 15:07:06 -------------------------------------------------------------------------------- Vitals Details Patient Name: Date of  Service: [name] 10/01/2023 2:45 PM Medical Record Number: [id number] Patient Account Number: [id number] Date of Birth/Sex: Treating RN: [birth year] (77 y.o. [name] Primary Care Anavey Coombes: [name] Other Clinician: Referring Zong Mcquarrie: Treating Alexiya Franqui/Extender: [name] in Treatment: 6 Vital Signs Time Taken: 14:49 Temperature (F): 97.8 Height (in): 63 Pulse (bpm): 74 Weight (lbs): 200 Respiratory Rate (breaths/min): [address] ([id number]) [id number] Page 11 of 11 Body Mass Index (BMI): 35.4 Blood Pressure (mmHg): 145/75 Reference Range: 80 - 120 mg / dl Electronic Signature(s) Signed: 10/01/2023 5:13:28 PM By: [name] Entered By: [name] on 10/01/2023 14:49:54

## 2023-10-02 NOTE — Progress Notes (Signed)
**Note [name]-Identified via Obfuscation** [name], [name] ([id number]) [id number] Page 1 of 14 Visit Report for 10/01/2023 Chief Complaint Document Details Patient Name: Date of Service: [name], [name] 10/01/2023 2:45 PM Medical Record Number: [id number] Patient Account Number: [id number] Date of Birth/Sex: Treating RN: [birth year] (77 y.o. F) Primary Care Provider: Eustaquio [name] Other Clinician: Referring Provider: Treating Provider/Extender: [name] in Treatment: 31 Information Obtained from: Patient Chief Complaint Patient presents for treatment of an open ulcer due to venous insufficiency Electronic Signature(s) Signed: 10/01/2023 4:51:02 PM By: Amber Guess MD FACS Entered By: [name] on 10/01/2023 16:51:02 -------------------------------------------------------------------------------- Debridement Details Patient Name: Date of Service: [name] 10/01/2023 2:45 PM Medical Record Number: [id number] Patient Account Number: [id number] Date of Birth/Sex: Treating RN: 12-23-1945 (77 y.o. [name] Primary Care Provider: Eustaquio [name] Other Clinician: Referring Provider: Treating Provider/Extender: [name] in Treatment: 61 Debridement Performed for Assessment: Wound #6 Right,Posterior Lower Leg Performed By: Physician Amber Guess, MD The following information was scribed by: [name] The information was scribed for: [name] Debridement Type: Debridement Level of Consciousness (Pre-procedure): Awake and Alert Pre-procedure Verification/Time Out Yes - 15:15 Taken: Start Time: 15:15 Pain Control: Lidocaine 4% T opical Solution Percent of Wound Bed Debrided: 100% T Area Debrided (cm): otal 0.35 Tissue and other material debrided: Viable, Non-Viable, Slough, Subcutaneous, Slough Level: Skin/Subcutaneous Tissue Debridement Description: Excisional Instrument:  Curette Bleeding: Minimum Hemostasis Achieved: Pressure Procedural Pain: 2 Post Procedural Pain: 1 Response to Treatment: Procedure was tolerated well Level of Consciousness (Post- Awake and Alert procedure): Post Debridement Measurements of Total Wound Length: (cm) 0.9 Width: (cm) 0.5 Depth: (cm) 0.1 Volume: (cm) 0.035 Character of Wound/Ulcer Post Debridement: Improved [name], [name] ([id number]) [id number] Page 2 of 14 Post Procedure Diagnosis Same as Pre-procedure Electronic Signature(s) Signed: 10/01/2023 5:13:28 PM By: Amber Deed RN, [name] Signed: 10/01/2023 5:33:59 PM By: Amber Guess MD FACS Entered By: [name] on 10/01/2023 15:17:17 -------------------------------------------------------------------------------- Debridement Details Patient Name: Date of Service: [name] 10/01/2023 2:45 PM Medical Record Number: [id number] Patient Account Number: [id number] Date of Birth/Sex: Treating RN: 07-05-1946 (77 y.o. [name] Primary Care Provider: Eustaquio [name] Other Clinician: Referring Provider: Treating Provider/Extender: [name] in Treatment: 61 Debridement Performed for Assessment: Wound #2 Left,Posterior Lower Leg Performed By: Physician Amber Guess, MD The following information was scribed by: [name] The information was scribed for: [name] Debridement Type: Debridement Severity of Tissue Pre Debridement: Fat layer exposed Level of Consciousness (Pre-procedure): Awake and Alert Pre-procedure Verification/Time Out Yes - 15:15 Taken: Start Time: 15:15 Pain Control: Lidocaine 4% T opical Solution Percent of Wound Bed Debrided: 80% T Area Debrided (cm): otal 3.72 Tissue and other material debrided: Viable, Non-Viable, Slough, Subcutaneous, Slough Level: Skin/Subcutaneous Tissue Debridement Description: Excisional Instrument: Curette Bleeding:  Minimum Hemostasis Achieved: Pressure Procedural Pain: 2 Post Procedural Pain: 1 Response to Treatment: Procedure was tolerated well Level of Consciousness (Post- Awake and Alert procedure): Post Debridement Measurements of Total Wound Length: (cm) 3.7 Width: (cm) 1.6 Depth: (cm) 0.1 Volume: (cm) 0.465 Character of Wound/Ulcer Post Debridement: Improved Severity of Tissue Post Debridement: Fat layer exposed Post Procedure Diagnosis Same as Pre-procedure Electronic Signature(s) Signed: 10/01/2023 5:13:28 PM By: Amber Deed RN, [name] Signed: 10/01/2023 5:33:59 PM By: Amber Guess MD FACS Entered By: [name] on 10/01/2023 15:18:02 [name] ([id number]) [id number] Page 3 of 14 -------------------------------------------------------------------------------- HPI Details Patient Name: Date of Service: [name] 10/01/2023 2:45 PM Medical Record Number: [id number]  Patient Account Number: [id number] Date of Birth/Sex: Treating RN: 1946-06-05 (77 y.o. F) Primary Care Provider: Eustaquio [name] Other Clinician: Referring Provider: Treating Provider/Extender: [name] in Treatment: 81 History of Present Illness HPI Description: ADMISSION This is a 77 year old woman with a history of chronic venous insufficiency status post saphenous vein ablations in 2010 and 2016. 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 She presents today with an open venous ulcer on her left lower leg. It has been present for a little over 2 weeks. She saw her primary care provider who apparently swabbed the wound and grew out Pseudomonas. She just completed a course of ciprofloxacin for this. ABI was 0.91. She reports that she has had previous issues with ulcers in this same location, stemming back to a punch biopsy taken by a dermatologist many years ago. She has had several skin substitutes applied to the  area that have ultimately resulted in healing on prior occasions. She has 2 small ulcers on her left medial lower leg. There is slough accumulation in both of them. The more anterior of the 2 is quite small and has some soft slough on the surface, underneath which there is good granulation tissue. The more medial wound also has slough accumulation, but the underlying surface is fairly fibrotic and gritty. This is consistent with her provided history of multiple ulcers in the same location. 08/03/2022: The anterior wound is smaller today with just a little bit of slough accumulation. The more medial wound continues to be very sensitive and fibrotic with slough buildup. 08/13/2022: The anterior wound is closed. The more medial wound remains sensitive with a fairly fibrotic surface. There is more granulation tissue filling in, however, and there is less slough than on prior occasions. 08/20/2022: The anterior wound remains closed. The more medial wound has filled with granulation tissue but still has a fair amount of slough on the surface and remains fairly tender. Unfortunately, she has developed 2 small ulcers just proximal to this. The fat layer is exposed in each. She says that over the weekend, she felt a burning sensation in that location. 08/27/2022: The 2 small wounds proximal to the main wound have merged into a single site. The wounds look a little bit dry, but they are quite clean without significant slough accumulation. They remain quite tender. 09/03/2022: All of the wounds have now merged into 1 large wound. There is a strong odor coming from the wound and the surface is not particularly viable. It is extremely painful for her today. 09/10/2022: The wound is less black and purple this week but still does not look particularly viable. The surface is desiccated. The culture that I took last week returned with a polymicrobial population including Pseudomonas. I prescribed both Augmentin and  levofloxacin. She has not yet picked up levofloxacin, as her pharmacy only notified her yesterday that it was ready. We have ordered a [employer] topical compounded antibiotic, but it has not yet come. 09/17/2022: The wound surface is markedly improved today. There is still an area of grayish-looking muscle but the rest appears significantly more viable. There is still a layer of slough on the surface. She still has a couple days left of oral antibiotic therapy. She has her Jodie Echevaria compounded topical antibiotic with her today. 09/24/2022: She has completed her oral antibiotic therapy. The wound surface is much cleaner today and more viable without any necrotic tissue. It is a bit desiccated, however. 10/01/2022: The moisture balance of the  wound has improved. There is a layer of yellow slough on the surface, but beneath this, the wound is more pink. 10/08/2022: The wound is smaller and cleaner today with a layer of slough present. She is having less pain. 12/18; this patient has a new wound on the right lateral ankle which apparently started after a cat scratched her. Secondarily she managed to drop a cake pan on the same area. This is very painful. The original wound was on the left posterior calf we have been using [employer] under an [employer]. The Unna boot fell down and apparently she was in for a nurse visit perhaps last week and that 1 fell down as well This patient has central venous mediated hypertension. For member correctly she has a stent placed in the left common iliac artery by Dr. Randie Heinz some years ago [facility] Thurner syndrome] she also has had venous ablations and I believe a history of a DVT The patient has compression pumps at home but she does not use them 10/30/2022: The patient says that her wounds burned all week with the [facility] classic in place. She has a fair amount of slough and nonviable subcutaneous tissue present on the right ankle. The left posterior calf wound is  cleaner than I have seen it to date. Both remain fairly tender. 11/14/2022: Both wounds have substantial slough and eschar accumulation. They remain extremely tender. 11/21/2022: The wounds are cleaner this week, but still have slough and eschar accumulation. She continues to describe burning pain in both wounds, but upon further questioning, she actually has burning in both of her feet that radiates up her legs and includes to the wounds. 11/28/2022: Both wounds have slough and eschar accumulation, as well as adherent silver alginate that was difficult to remove. She continues to have pain out of proportion to the extent of her wounds. Her PCP did initiate Lyrica which she started taking last night. The dose is quite low, only 25 mg, but apparently there is a plan for upward titration, assuming she tolerates the medication. 12/05/2022: The wounds are both a little bit smaller today, but have significant slough accumulation, as usual. They remain exquisitely tender. She is now taking Lyrica twice a day. 12/19/2022: Both of her wounds look significantly better this week. There is slough buildup on both, but they are smaller. She seems to be getting a good response from Lyrica as she is having much less pain. 12/26/2022: The wound on her right lateral ankle is nearly closed. The wound on her left posterior calf is smaller but still has a lot of slough accumulation. They remain tender. 01/02/2023: The right lateral ankle wound is down to just a couple of millimeters. It is much less tender than on prior occasions. There is minimal slough buildup. The wound on her posterior calf continues to contract, as well. It has thick slough buildup and remains fairly tender. [name], [name] ([id number]) [id number] Page 4 of 14 01/09/2023: The right lateral ankle wound is nearly closed. He remains tender. There is just a little eschar on the surface. The wound on her posterior calf has improved  quite a bit. There is still slough on the surface, but the more proximal area has epithelialized substantially and there is no longer any gray discoloration to her tissue. 3/13; patient presents for follow-up. Her right lateral ankle wound is almost healed. She has a wound to her left posterior calf with slough and granulation tissue. We have been using Santyl under [employer]  boots bilaterally to the lower extremities. She has no issues or complaints today. 01/30/2023: The right lateral ankle wound is down to just a pinhole under a layer of eschar. The left posterior calf wound is quite a bit improved since the last time I saw it. There is still a layer of slough on the surface but there has been more epithelialization. 02/07/2023: The right lateral ankle wound still has some eschar on the surface. The left posterior calf wound is about the same size, but with more areas of epithelialization. 02/20/2023: The right lateral ankle wound is healed. The left posterior calf wound is smaller and is essentially flush with the surrounding skin, but the surface is quite dry. Her leg wrap slipped and her calf is quite swollen above the level of the wound. 03/01/2023: The right lateral ankle wound reopened and she also suffered a fall that resulted in a small wound on her right anterior tibial surface. The left posterior calf wound has a much better moisture balance today. There is a layer of slough on the surface. The [employer] worked much better for her as far as compression this week. 03/06/2023: The right lateral ankle wound has a layer of eschar on the surface and is too painful for me to debride. The anterior tibial surface wound is more or less healed; it is a very superficial abrasion at this point. The left posterior calf wound has a layer of slough on the surface. Her edema control is better. 03/13/2023: The right lateral ankle wound has a layer of eschar on the surface. Once this was debrided, it was found to be  healed. The anterior tibial surface wound has a layer of stuck silver alginate on the surface. This was removed to reveal a very superficial abrasion but not much change from last week. The left posterior calf wound is smaller and has its usual layer of accumulated slough. 03/25/2023: The anterior tibial surface wound has eschar and silver alginate stuck to it. Once this was removed, the wound was found to be healed. The left posterior calf wound is roughly the same size and has a little bit thicker layer of accumulated slough. 04/03/2023: She has had more drainage from the posterior calf wound over the last week and the layer of slough is thicker. She also says it is more tender. 04/10/2023: Continued increase in drainage from her wound. The wound remains tender with a layer of slough on the surface. It does not appear as viable this week. She also has erythema, swelling, and warmth in her entire left lower leg extending into the midfoot. 04/17/2023: The wound is about the same size this week. There is fibrotic adherent slough, but the drainage is not as profuse. She is currently taking levofloxacin. She is complaining of an increase in the burning sensation at her wound. 04/24/2023: The wound measured smaller this week and is much cleaner-looking. She completed her course of levofloxacin. Her PCP increased her dose of Lyrica and she reports that the burning has stopped. 05/01/2023: The wound is smaller again today and quite clean with only minimal slough on the surface. 05/08/2023: The wound is smaller again today. It is fairly clean, but the surface is a bit fibrotic. Moisture balance is better. Her leg is more swollen and erythematous today, however. 05/15/2023: The wound is about the same size today. The surface has very little slough accumulation, but remains fibrotic. Moisture balance is good. She is responding nicely to the oral Keflex she is taking  and her leg is less erythematous and  edematous. 05/22/2023: The wound is unchanged in size. There is quite a bit of slough on the surface but the moisture balance is better. It is still fibrotic, but a little bit less so. Her leg is no longer erythematous, but she does have some persistent edema. 05/29/2023: The wound measured a little bit smaller today. It is dry again and the surface is quite fibrotic. The edema in her leg has improved. 06/12/2023: The wound measures slightly larger today. The moisture balance has improved markedly. There is soft slough on the wound surface. Edema control is improved. 06/19/2023: The wound measured slightly smaller today. There is a band of epithelium dividing the most proximal portion of the wound from the remainder. There is slough accumulation and moisture balance is improved. 06/26/2023: The band of epithelium between the 2 open areas has gotten wider. There is some slough accumulation on the surfaces along with a little bit of eschar. Moisture balance continues to be good. 07/03/2023: The band of epithelium continues to expand. The moisture balance remains good and the surface, particularly of the more distal aspect of the wound continues to improve. Slough and eschar accumulation, as usual. 07/10/2023: The wound measured smaller today. Moisture balance is good and the tissue underneath the layer of slough that has accumulated continues to improve. 07/17/2023: The wound is smaller again today. Moisture balance is appropriate. There is a little bit of slough overlying granulation tissue. 07/24/2023: The band of epithelium between the 2 open areas has gotten wider. The more proximal area has more epithelialization around the perimeter. Both have slough on the surface and remain fairly fibrotic. Moisture balance remains good. 08/07/2023: Both open areas are little bit smaller today. The band of epithelium between the 2 continues to enlarge. There is slough and eschar on both surfaces. 08/14/2023: No  significant change to the wound dimensions. The surface is rather dry and fibrotic again. 08/21/2023: The wound is smaller and the skin bridge between the 2 open areas has gotten wider. There is slough accumulation on the surfaces and the moisture balance is improved. 08/28/2023: Both open areas of the wound are smaller today. There is slough on the surface, but it is quite soft. Moisture balance is good. 09/04/2023: The band of epithelium between the 2 open areas continues to widen. The wound is very sensitive today, more so than usual. No concern for infection based upon visual inspection, however. There is some slough on the wound surface, which remains fairly fibrotic. 09/11/2023: Both open areas are little bit smaller today. There is slough accumulation on the surface. The underlying tissue is still somewhat fibrotic, but the color is good. Edema control is good. 09/17/2023: The wound continues to improve. The open areas are smaller and the band of skin has gotten wider. The tissue color is possibly the best that I have ever seen it although it does remain somewhat fibrotic. There is slough on the wound surface. [name], [name] ([id number]) [id number] Page 5 of 14 09/25/2023: The wound is smaller again today. The slough on the surface is rubbery but the underlying tissue continues to improve, quality-wise. It is less fibrotic today. Edema control is good. 10/01/2023: The wound continues to contract. There is slough on the surface. The underlying tissue continues to be less fibrotic from week to week. Unfortunately, she has reopened an area on her right posterior lower leg. It extends to the fat layer and there is slough accumulation on the surface. This  occurred despite Unna boot compression. Electronic Signature(s) Signed: 10/01/2023 4:52:02 PM By: Amber Guess MD FACS Entered By: [name] on 10/01/2023  16:52:02 -------------------------------------------------------------------------------- Physical Exam Details Patient Name: Date of Service: [name], [name] 10/01/2023 2:45 PM Medical Record Number: [id number] Patient Account Number: [id number] Date of Birth/Sex: Treating RN: 1946-06-28 (77 y.o. F) Primary Care Provider: Eustaquio [name] Other Clinician: Referring Provider: Treating Provider/Extender: [name] in Treatment: 24 Constitutional Slightly hypertensive. . . . no acute distress. Respiratory Normal work of breathing on room air.. Notes 10/01/2023: The wound continues to contract. There is slough on the surface. The underlying tissue continues to be less fibrotic from week to week. Unfortunately, she has reopened an area on her right posterior lower leg. It extends to the fat layer and there is slough accumulation on the surface. This occurred despite Unna boot compression. Electronic Signature(s) Signed: 10/01/2023 4:52:39 PM By: Amber Guess MD FACS Entered By: [name] on 10/01/2023 16:52:39 -------------------------------------------------------------------------------- Physician Orders Details Patient Name: Date of Service: [name], [name] 10/01/2023 2:45 PM Medical Record Number: [id number] Patient Account Number: [id number] Date of Birth/Sex: Treating RN: 1946-01-22 (77 y.o. [name] Primary Care Provider: Eustaquio [name] Other Clinician: Referring Provider: Treating Provider/Extender: [name] in Treatment: 5 The following information was scribed by: [name] The information was scribed for: [name] Verbal / Phone Orders: No Diagnosis Coding ICD-10 Coding Code Description [phone] Non-pressure chronic ulcer of other part of left lower leg with fat layer exposed I83.893 Varicose veins of bilateral lower extremities with other complications I87.2 Venous  insufficiency (chronic) (peripheral) G62.9 Polyneuropathy, unspecified R60.0 Localized edema [name], [name] ([id number]) [id number] Page 6 of 14 Follow-up Appointments Return appointment in 3 weeks. - Dr. Lady Gary 12/18 Nurse Visit: - next 2 weeks Anesthetic Wound #2 Left,Posterior Lower Leg (In clinic) Topical Lidocaine 4% applied to wound bed Bathing/ Shower/ Hygiene May shower with protection but do not get wound dressing(s) wet. Protect dressing(s) with water repellant cover (for example, large plastic bag) or a cast cover and may then take shower. Edema Control - Orders / Instructions Other Edema Control Orders/Instructions: - Right Lymphadema non-wound Coflex w/Calamine Additional Orders / Instructions Other: - Recommend Nail Care Wound Treatment Wound #2 - Lower Leg Wound Laterality: Left, Posterior Cleanser: Soap and Water 1 x Per Week/30 Days Discharge Instructions: May shower and wash wound with dial antibacterial soap and water prior to dressing change. Peri-Wound Care: Ketoconazole Cream 2% 1 x Per Week/30 Days Discharge Instructions: Apply Ketoconazole as directed Peri-Wound Care: Triamcinolone 15 (g) 1 x Per Week/30 Days Discharge Instructions: Use triamcinolone 15 (g) as directed Peri-Wound Care: Zinc Oxide Ointment 30g tube 1 x Per Week/30 Days Discharge Instructions: Apply Zinc Oxide to periwound with each dressing change Peri-Wound Care: Sween Lotion (Moisturizing lotion) 1 x Per Week/30 Days Discharge Instructions: Apply moisturizing lotion as directed Topical: Gentamicin 1 x Per Week/30 Days Discharge Instructions: As directed by physician Topical: Mupirocin Ointment 1 x Per Week/30 Days Discharge Instructions: Apply Mupirocin (Bactroban) as instructed Topical: Skintegrity Hydrogel 4 (oz) 1 x Per Week/30 Days Discharge Instructions: Apply hydrogel as directed Prim Dressing: Promogran Prisma Matrix, 4.34 (sq in) (silver collagen) 1 x  Per Week/30 Days ary Discharge Instructions: Moisten collagen with saline or hydrogel Secondary Dressing: Optifoam Non-Adhesive Dressing, 4x4 in 1 x Per Week/30 Days Discharge Instructions: Apply over primary dressing as directed. Compression Wrap: CoFlex Calamine Unna Boot 4 x 6 (in/yd) 1 x Per Week/30 Days Discharge  Instructions: Apply Coflex Calamine [employer] as directed. Wound #6 - Lower Leg Wound Laterality: Right, Posterior Cleanser: Soap and Water 1 x Per Week Discharge Instructions: May shower and wash wound with dial antibacterial soap and water prior to dressing change. Peri-Wound Care: Sween Lotion (Moisturizing lotion) 1 x Per Week Discharge Instructions: Apply moisturizing lotion as directed Prim Dressing: Promogran Prisma Matrix, 4.34 (sq in) (silver collagen) 1 x Per Week ary Discharge Instructions: Moisten collagen with saline or hydrogel Secondary Dressing: Woven Gauze Sponge, Non-Sterile 4x4 in 1 x Per Week Discharge Instructions: Apply over primary dressing as directed. Compression Wrap: CoFlex Calamine Unna Boot 4 x 6 (in/yd) 1 x Per Week Discharge Instructions: Apply Coflex Calamine [employer] as directed. Electronic Signature(s) Signed: 10/01/2023 5:33:59 PM By: Amber Guess MD FACS Entered By: [name] on 10/01/2023 16:53:09 [name] ([id number]) [id number] Page 7 of 14 -------------------------------------------------------------------------------- Problem List Details Patient Name: Date of Service: DENAJAH, RADWANSKI 10/01/2023 2:45 PM Medical Record Number: [id number] Patient Account Number: [id number] Date of Birth/Sex: Treating RN: October 30, 1946 (77 y.o. [name] Primary Care Provider: Eustaquio [name] Other Clinician: Referring Provider: Treating Provider/Extender: [name] in Treatment: 32 Active Problems ICD-10 Encounter Code Description Active Date  MDM Diagnosis [phone] Non-pressure chronic ulcer of other part of left lower leg with fat layer exposed 07/27/2022 No Yes L97.818 Non-pressure chronic ulcer of other part of right lower leg with other specified 10/01/2023 No Yes severity Q59.563 Varicose veins of bilateral lower extremities with other complications 07/27/2022 No Yes I87.2 Venous insufficiency (chronic) (peripheral) 07/27/2022 No Yes G62.9 Polyneuropathy, unspecified 07/27/2022 No Yes R60.0 Localized edema 07/27/2022 No Yes Inactive Problems ICD-10 Code Description Active Date Inactive Date L03.115 Cellulitis of right lower limb 10/22/2022 10/22/2022 Resolved Problems Electronic Signature(s) Signed: 10/01/2023 4:50:41 PM By: Amber Guess MD FACS Entered By: [name] on 10/01/2023 16:50:41 -------------------------------------------------------------------------------- Progress Note Details Patient Name: Date of Service: [name] 10/01/2023 2:45 PM Medical Record Number: [id number] Patient Account Number: [id number] Date of Birth/Sex: Treating RN: Feb 13, 1946 (77 y.o. Shayla Noguera, [name] ([id number]) [id number] Page 8 of 14 Primary Care Provider: Eustaquio [name] Other Clinician: Referring Provider: Treating Provider/Extender: [name] in Treatment: 34 Subjective Chief Complaint Information obtained from Patient Patient presents for treatment of an open ulcer due to venous insufficiency History of Present Illness (HPI) ADMISSION This is a 77 year old woman with a history of chronic venous insufficiency status post saphenous vein ablations in 2010 and 2016. 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 She presents today with an open venous ulcer on her left lower leg. It has been present for a little over 2 weeks. She saw her primary care provider who apparently swabbed the wound and grew out Pseudomonas. She just  completed a course of ciprofloxacin for this. ABI was 0.91. She reports that she has had previous issues with ulcers in this same location, stemming back to a punch biopsy taken by a dermatologist many years ago. She has had several skin substitutes applied to the area that have ultimately resulted in healing on prior occasions. She has 2 small ulcers on her left medial lower leg. There is slough accumulation in both of them. The more anterior of the 2 is quite small and has some soft slough on the surface, underneath which there is good granulation tissue. The more medial wound also has slough accumulation, but the underlying surface is fairly fibrotic and gritty. This is consistent with  her provided history of multiple ulcers in the same location. 08/03/2022: The anterior wound is smaller today with just a little bit of slough accumulation. The more medial wound continues to be very sensitive and fibrotic with slough buildup. 08/13/2022: The anterior wound is closed. The more medial wound remains sensitive with a fairly fibrotic surface. There is more granulation tissue filling in, however, and there is less slough than on prior occasions. 08/20/2022: The anterior wound remains closed. The more medial wound has filled with granulation tissue but still has a fair amount of slough on the surface and remains fairly tender. Unfortunately, she has developed 2 small ulcers just proximal to this. The fat layer is exposed in each. She says that over the weekend, she felt a burning sensation in that location. 08/27/2022: The 2 small wounds proximal to the main wound have merged into a single site. The wounds look a little bit dry, but they are quite clean without significant slough accumulation. They remain quite tender. 09/03/2022: All of the wounds have now merged into 1 large wound. There is a strong odor coming from the wound and the surface is not particularly viable. It is extremely painful for her  today. 09/10/2022: The wound is less black and purple this week but still does not look particularly viable. The surface is desiccated. The culture that I took last week returned with a polymicrobial population including Pseudomonas. I prescribed both Augmentin and levofloxacin. She has not yet picked up levofloxacin, as her pharmacy only notified her yesterday that it was ready. We have ordered a [employer] topical compounded antibiotic, but it has not yet come. 09/17/2022: The wound surface is markedly improved today. There is still an area of grayish-looking muscle but the rest appears significantly more viable. There is still a layer of slough on the surface. She still has a couple days left of oral antibiotic therapy. She has her Jodie Echevaria compounded topical antibiotic with her today. 09/24/2022: She has completed her oral antibiotic therapy. The wound surface is much cleaner today and more viable without any necrotic tissue. It is a bit desiccated, however. 10/01/2022: The moisture balance of the wound has improved. There is a layer of yellow slough on the surface, but beneath this, the wound is more pink. 10/08/2022: The wound is smaller and cleaner today with a layer of slough present. She is having less pain. 12/18; this patient has a new wound on the right lateral ankle which apparently started after a cat scratched her. Secondarily she managed to drop a cake pan on the same area. This is very painful. The original wound was on the left posterior calf we have been using [employer] under an [employer]. The Unna boot fell down and apparently she was in for a nurse visit perhaps last week and that 1 fell down as well This patient has central venous mediated hypertension. For member correctly she has a stent placed in the left common iliac artery by Dr. Randie Heinz some years ago [facility] Thurner syndrome] she also has had venous ablations and I believe a history of a DVT The patient has compression pumps  at home but she does not use them 10/30/2022: The patient says that her wounds burned all week with the [facility] classic in place. She has a fair amount of slough and nonviable subcutaneous tissue present on the right ankle. The left posterior calf wound is cleaner than I have seen it to date. Both remain fairly tender. 11/14/2022: Both wounds have  substantial slough and eschar accumulation. They remain extremely tender. 11/21/2022: The wounds are cleaner this week, but still have slough and eschar accumulation. She continues to describe burning pain in both wounds, but upon further questioning, she actually has burning in both of her feet that radiates up her legs and includes to the wounds. 11/28/2022: Both wounds have slough and eschar accumulation, as well as adherent silver alginate that was difficult to remove. She continues to have pain out of proportion to the extent of her wounds. Her PCP did initiate Lyrica which she started taking last night. The dose is quite low, only 25 mg, but apparently there is a plan for upward titration, assuming she tolerates the medication. 12/05/2022: The wounds are both a little bit smaller today, but have significant slough accumulation, as usual. They remain exquisitely tender. She is now taking Lyrica twice a day. 12/19/2022: Both of her wounds look significantly better this week. There is slough buildup on both, but they are smaller. She seems to be getting a good response from Lyrica as she is having much less pain. 12/26/2022: The wound on her right lateral ankle is nearly closed. The wound on her left posterior calf is smaller but still has a lot of slough accumulation. They remain tender. 01/02/2023: The right lateral ankle wound is down to just a couple of millimeters. It is much less tender than on prior occasions. There is minimal slough buildup. The wound on her posterior calf continues to contract, as well. It has thick slough buildup and remains  fairly tender. 01/09/2023: The right lateral ankle wound is nearly closed. He remains tender. There is just a little eschar on the surface. The wound on her posterior calf has [name], [name] ([id number]) [id number].pdf Page 9 of 14 improved quite a bit. There is still slough on the surface, but the more proximal area has epithelialized substantially and there is no longer any gray discoloration to her tissue. 3/13; patient presents for follow-up. Her right lateral ankle wound is almost healed. She has a wound to her left posterior calf with slough and granulation tissue. We have been using Santyl under Unna boots bilaterally to the lower extremities. She has no issues or complaints today. 01/30/2023: The right lateral ankle wound is down to just a pinhole under a layer of eschar. The left posterior calf wound is quite a bit improved since the last time I saw it. There is still a layer of slough on the surface but there has been more epithelialization. 02/07/2023: The right lateral ankle wound still has some eschar on the surface. The left posterior calf wound is about the same size, but with more areas of epithelialization. 02/20/2023: The right lateral ankle wound is healed. The left posterior calf wound is smaller and is essentially flush with the surrounding skin, but the surface is quite dry. Her leg wrap slipped and her calf is quite swollen above the level of the wound. 03/01/2023: The right lateral ankle wound reopened and she also suffered a fall that resulted in a small wound on her right anterior tibial surface. The left posterior calf wound has a much better moisture balance today. There is a layer of slough on the surface. The [employer] worked much better for her as far as compression this week. 03/06/2023: The right lateral ankle wound has a layer of eschar on the surface and is too painful for me to debride. The anterior tibial surface wound is more or less healed; it  is  a very superficial abrasion at this point. The left posterior calf wound has a layer of slough on the surface. Her edema control is better. 03/13/2023: The right lateral ankle wound has a layer of eschar on the surface. Once this was debrided, it was found to be healed. The anterior tibial surface wound has a layer of stuck silver alginate on the surface. This was removed to reveal a very superficial abrasion but not much change from last week. The left posterior calf wound is smaller and has its usual layer of accumulated slough. 03/25/2023: The anterior tibial surface wound has eschar and silver alginate stuck to it. Once this was removed, the wound was found to be healed. The left posterior calf wound is roughly the same size and has a little bit thicker layer of accumulated slough. 04/03/2023: She has had more drainage from the posterior calf wound over the last week and the layer of slough is thicker. She also says it is more tender. 04/10/2023: Continued increase in drainage from her wound. The wound remains tender with a layer of slough on the surface. It does not appear as viable this week. She also has erythema, swelling, and warmth in her entire left lower leg extending into the midfoot. 04/17/2023: The wound is about the same size this week. There is fibrotic adherent slough, but the drainage is not as profuse. She is currently taking levofloxacin. She is complaining of an increase in the burning sensation at her wound. 04/24/2023: The wound measured smaller this week and is much cleaner-looking. She completed her course of levofloxacin. Her PCP increased her dose of Lyrica and she reports that the burning has stopped. 05/01/2023: The wound is smaller again today and quite clean with only minimal slough on the surface. 05/08/2023: The wound is smaller again today. It is fairly clean, but the surface is a bit fibrotic. Moisture balance is better. Her leg is more swollen and erythematous today,  however. 05/15/2023: The wound is about the same size today. The surface has very little slough accumulation, but remains fibrotic. Moisture balance is good. She is responding nicely to the oral Keflex she is taking and her leg is less erythematous and edematous. 05/22/2023: The wound is unchanged in size. There is quite a bit of slough on the surface but the moisture balance is better. It is still fibrotic, but a little bit less so. Her leg is no longer erythematous, but she does have some persistent edema. 05/29/2023: The wound measured a little bit smaller today. It is dry again and the surface is quite fibrotic. The edema in her leg has improved. 06/12/2023: The wound measures slightly larger today. The moisture balance has improved markedly. There is soft slough on the wound surface. Edema control is improved. 06/19/2023: The wound measured slightly smaller today. There is a band of epithelium dividing the most proximal portion of the wound from the remainder. There is slough accumulation and moisture balance is improved. 06/26/2023: The band of epithelium between the 2 open areas has gotten wider. There is some slough accumulation on the surfaces along with a little bit of eschar. Moisture balance continues to be good. 07/03/2023: The band of epithelium continues to expand. The moisture balance remains good and the surface, particularly of the more distal aspect of the wound continues to improve. Slough and eschar accumulation, as usual. 07/10/2023: The wound measured smaller today. Moisture balance is good and the tissue underneath the layer of slough that has accumulated continues to improve. 07/17/2023:  The wound is smaller again today. Moisture balance is appropriate. There is a little bit of slough overlying granulation tissue. 07/24/2023: The band of epithelium between the 2 open areas has gotten wider. The more proximal area has more epithelialization around the perimeter. Both have slough on the  surface and remain fairly fibrotic. Moisture balance remains good. 08/07/2023: Both open areas are little bit smaller today. The band of epithelium between the 2 continues to enlarge. There is slough and eschar on both surfaces. 08/14/2023: No significant change to the wound dimensions. The surface is rather dry and fibrotic again. 08/21/2023: The wound is smaller and the skin bridge between the 2 open areas has gotten wider. There is slough accumulation on the surfaces and the moisture balance is improved. 08/28/2023: Both open areas of the wound are smaller today. There is slough on the surface, but it is quite soft. Moisture balance is good. 09/04/2023: The band of epithelium between the 2 open areas continues to widen. The wound is very sensitive today, more so than usual. No concern for infection based upon visual inspection, however. There is some slough on the wound surface, which remains fairly fibrotic. 09/11/2023: Both open areas are little bit smaller today. There is slough accumulation on the surface. The underlying tissue is still somewhat fibrotic, but the color is good. Edema control is good. 09/17/2023: The wound continues to improve. The open areas are smaller and the band of skin has gotten wider. The tissue color is possibly the best that I have ever seen it although it does remain somewhat fibrotic. There is slough on the wound surface. 09/25/2023: The wound is smaller again today. The slough on the surface is rubbery but the underlying tissue continues to improve, quality-wise. It is less [name], [name] ([id number]) [id number] Page 10 of 14 fibrotic today. Edema control is good. 10/01/2023: The wound continues to contract. There is slough on the surface. The underlying tissue continues to be less fibrotic from week to week. Unfortunately, she has reopened an area on her right posterior lower leg. It extends to the fat layer and there is slough  accumulation on the surface. This occurred despite Unna boot compression. Patient History Information obtained from Patient. Family History Cancer - Mother,Paternal Grandparents, Diabetes - Siblings, Heart Disease - Father, Hypertension - Father. Social History Former smoker - quit in 1967. Medical History Eyes Patient has history of Cataracts - L eye Ear/Nose/Mouth/Throat Denies history of Chronic sinus problems/congestion, Middle ear problems Hematologic/Lymphatic Denies history of Anemia, Human Immunodeficiency Virus, Lymphedema Respiratory Patient has history of Asthma Cardiovascular Patient has history of Hypertension Endocrine Denies history of Type I Diabetes, Type II Diabetes Neurologic Patient has history of Neuropathy - Feet and finger Denies history of Seizure Disorder Oncologic Patient has history of Received Chemotherapy - 2012, Received Radiation - 2012 Hospitalization/Surgery History - Lower extremity venography 2020. - Intravascular ultrasound. - peripheral vascular intervention. - melanoma 2011. 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 no acute distress. Vitals Time Taken: 2:49 PM, Height: 63 in, Weight: 200 lbs, BMI: 35.4, Temperature: 97.8 F, Pulse: 74 bpm, Respiratory Rate: 18 breaths/min, Blood Pressure: 145/75 mmHg. Respiratory Normal work of breathing on room air.. General Notes: 10/01/2023: The wound continues to contract. There is slough on the surface. The underlying tissue continues to be less fibrotic from week to week.  Unfortunately, she has reopened an area on her right posterior lower leg. It extends to the fat layer and there is slough accumulation on the surface. This occurred  despite Unna boot compression. Integumentary (Hair, Skin) Wound #2 status is Open. Original cause of wound was Blister. The date acquired was: 07/06/2022. The wound has been in treatment 61 weeks. The wound is located on the Left,Posterior Lower Leg. The wound measures 3.7cm length x 1.6cm width x 0.1cm depth; 4.65cm^2 area and 0.465cm^3 volume. There is Fat Layer (Subcutaneous Tissue) exposed. There is no tunneling or undermining noted. There is a medium amount of serosanguineous drainage noted. The wound margin is distinct with the outline attached to the wound base. There is medium (34-66%) red granulation within the wound bed. There is a medium (34-66%) amount of necrotic tissue within the wound bed including Adherent Slough. The periwound skin appearance had no abnormalities noted for texture. The periwound skin appearance had no abnormalities noted for moisture. The periwound skin appearance did not exhibit: Rubor. Periwound temperature was noted as No Abnormality. The periwound has tenderness on palpation. Wound #6 status is Open. Original cause of wound was Gradually Appeared. The date acquired was: 10/01/2023. The wound is located on the Right,Posterior Lower Leg. The wound measures 0.9cm length x 0.5cm width x 0.1cm depth; 0.353cm^2 area and 0.035cm^3 volume. There is Fat Layer (Subcutaneous Tissue) exposed. There is no tunneling or undermining noted. There is a small amount of serosanguineous drainage noted. The wound margin is distinct with the outline attached to the wound base. There is medium (34-66%) pink granulation within the wound bed. There is a medium (34-66%) amount of necrotic tissue within the wound bed including Adherent Slough. The periwound skin appearance had no abnormalities noted for texture. The periwound skin appearance had no abnormalities noted for moisture. The periwound skin appearance exhibited: Hemosiderin Staining. Periwound temperature was noted as No Abnormality.  The INGER, DANDREA ([id number]) [phone].pdf Page 11 of 14 periwound has tenderness on palpation. Assessment Active Problems ICD-10 Non-pressure chronic ulcer of other part of left lower leg with fat layer exposed Non-pressure chronic ulcer of other part of right lower leg with other specified severity Varicose veins of bilateral lower extremities with other complications Venous insufficiency (chronic) (peripheral) Polyneuropathy, unspecified Localized edema Procedures Wound #2 Pre-procedure diagnosis of Wound #2 is a Venous Leg Ulcer located on the Left,Posterior Lower Leg .Severity of Tissue Pre Debridement is: Fat layer exposed. There was a Excisional Skin/Subcutaneous Tissue Debridement with a total area of 3.72 sq cm performed by Amber Guess, MD. With the following instrument(s): Curette to remove Viable and Non-Viable tissue/material. Material removed includes Subcutaneous Tissue and Slough and after achieving pain control using Lidocaine 4% Topical Solution. No specimens were taken. A time out was conducted at 15:15, prior to the start of the procedure. A Minimum amount of bleeding was controlled with Pressure. The procedure was tolerated well with a pain level of 2 throughout and a pain level of 1 following the procedure. Post Debridement Measurements: 3.7cm length x 1.6cm width x 0.1cm depth; 0.465cm^3 volume. Character of Wound/Ulcer Post Debridement is improved. Severity of Tissue Post Debridement is: Fat layer exposed. Post procedure Diagnosis Wound #2: Same as Pre-Procedure Pre-procedure diagnosis of Wound #2 is a Venous Leg Ulcer located on the Left,Posterior Lower Leg . There was a Radio broadcast assistant Compression Therapy Procedure by Amber Deed, RN. Post procedure Diagnosis Wound #2: Same as Pre-Procedure Wound #6 Pre-procedure diagnosis of Wound #6 is a Lymphedema located  on the Right,Posterior Lower Leg . There was a Excisional Skin/Subcutaneous  Tissue Debridement with a total area of 0.35 sq cm performed by Amber Guess, MD. With the following instrument(s): Curette to remove Viable and Non-Viable tissue/material. Material removed includes Subcutaneous Tissue and Slough and after achieving pain control using Lidocaine 4% T opical Solution. No specimens were taken. A time out was conducted at 15:15, prior to the start of the procedure. A Minimum amount of bleeding was controlled with Pressure. The procedure was tolerated well with a pain level of 2 throughout and a pain level of 1 following the procedure. Post Debridement Measurements: 0.9cm length x 0.5cm width x 0.1cm depth; 0.035cm^3 volume. Character of Wound/Ulcer Post Debridement is improved. Post procedure Diagnosis Wound #6: Same as Pre-Procedure Pre-procedure diagnosis of Wound #6 is a Lymphedema located on the Right,Posterior Lower Leg . There was a Radio broadcast assistant Compression Therapy Procedure by Amber Deed, RN. Post procedure Diagnosis Wound #6: Same as Pre-Procedure Plan Follow-up Appointments: Return appointment in 3 weeks. - Dr. Lady Gary 12/18 Nurse Visit: - next 2 weeks Anesthetic: Wound #2 Left,Posterior Lower Leg: (In clinic) Topical Lidocaine 4% applied to wound bed Bathing/ Shower/ Hygiene: May shower with protection but do not get wound dressing(s) wet. Protect dressing(s) with water repellant cover (for example, large plastic bag) or a cast cover and may then take shower. Edema Control - Orders / Instructions: Other Edema Control Orders/Instructions: - Right Lymphadema non-wound Coflex w/Calamine Additional Orders / Instructions: Other: - Recommend Nail Care WOUND #2: - Lower Leg Wound Laterality: Left, Posterior Cleanser: Soap and Water 1 x Per Week/30 Days Discharge Instructions: May shower and wash wound with dial antibacterial soap and water prior to dressing change. Peri-Wound Care: Ketoconazole Cream 2% 1 x Per Week/30 Days Discharge Instructions:  Apply Ketoconazole as directed Peri-Wound Care: Triamcinolone 15 (g) 1 x Per Week/30 Days Discharge Instructions: Use triamcinolone 15 (g) as directed Peri-Wound Care: Zinc Oxide Ointment 30g tube 1 x Per Week/30 Days Discharge Instructions: Apply Zinc Oxide to periwound with each dressing change Peri-Wound Care: Sween Lotion (Moisturizing lotion) 1 x Per Week/30 Days Discharge Instructions: Apply moisturizing lotion as directed Topical: Gentamicin 1 x Per Week/30 Days Discharge Instructions: As directed by physician [name] ([id number]) [id number] Page 12 of 14 Topical: Mupirocin Ointment 1 x Per Week/30 Days Discharge Instructions: Apply Mupirocin (Bactroban) as instructed Topical: Skintegrity Hydrogel 4 (oz) 1 x Per Week/30 Days Discharge Instructions: Apply hydrogel as directed Prim Dressing: Promogran Prisma Matrix, 4.34 (sq in) (silver collagen) 1 x Per Week/30 Days ary Discharge Instructions: Moisten collagen with saline or hydrogel Secondary Dressing: Optifoam Non-Adhesive Dressing, 4x4 in 1 x Per Week/30 Days Discharge Instructions: Apply over primary dressing as directed. Com pression Wrap: CoFlex Calamine Unna Boot 4 x 6 (in/yd) 1 x Per Week/30 Days Discharge Instructions: Apply Coflex Calamine [employer] as directed. WOUND #6: - Lower Leg Wound Laterality: Right, Posterior Cleanser: Soap and Water 1 x Per Week/ Discharge Instructions: May shower and wash wound with dial antibacterial soap and water prior to dressing change. Peri-Wound Care: Sween Lotion (Moisturizing lotion) 1 x Per Week/ Discharge Instructions: Apply moisturizing lotion as directed Prim Dressing: Promogran Prisma Matrix, 4.34 (sq in) (silver collagen) 1 x Per Week/ ary Discharge Instructions: Moisten collagen with saline or hydrogel Secondary Dressing: Woven Gauze Sponge, Non-Sterile 4x4 in 1 x Per Week/ Discharge Instructions: Apply over primary dressing as directed. Com  pression Wrap: CoFlex Calamine Unna Boot 4 x 6 (in/yd) 1 x  Per Week/ Discharge Instructions: Apply Coflex Calamine [employer] as directed. 10/01/2023: The wound continues to contract. There is slough on the surface. The underlying tissue continues to be less fibrotic from week to week. Unfortunately, she has reopened an area on her right posterior lower leg. It extends to the fat layer and there is slough accumulation on the surface. This occurred despite Unna boot compression. I used a curette to debride slough and subcutaneous tissue from the new wound, as well as her existing wound. We will continue to use the combination of topical gentamicin and mupirocin with hydrogel-moistened Prisma silver collagen, and Optifoam covering for moisture retention, and an Unna boot/Coflex on the left. On the right, we will use Prisma silver collagen and a calamine Unna boot/Coflex, as well. She will have nurse visits for the next couple of weeks secondary to clinic scheduling and provider availability. She will have a wound care visit with me in 3 weeks. Electronic Signature(s) Signed: 10/01/2023 4:55:10 PM By: Amber Guess MD FACS Entered By: [name] on 10/01/2023 16:55:10 -------------------------------------------------------------------------------- HxROS Details Patient Name: Date of Service: [name], [name] 10/01/2023 2:45 PM Medical Record Number: [id number] Patient Account Number: [id number] Date of Birth/Sex: Treating RN: 08/23/1946 (77 y.o. F) Primary Care Provider: Eustaquio [name] Other Clinician: Referring Provider: Treating Provider/Extender: [name] in Treatment: 35 Information Obtained From Patient Eyes Medical History: Positive for: Cataracts - L eye Ear/Nose/Mouth/Throat Medical History: Negative for: Chronic sinus problems/congestion; Middle ear problems Hematologic/Lymphatic Medical History: Negative for: Anemia; Human  Immunodeficiency Virus; Lymphedema Respiratory Medical History: Positive for: Asthma Past Medical History Notes: OSA on CPAP [name], [name] ([id number]) [id number] Page 13 of 14 Cardiovascular Medical History: Positive for: Hypertension Past Medical History Notes: Cronic venous insufficiency 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 Gastrointestinal Medical History: Past Medical History Notes: liver cyst Endocrine Medical History: Negative for: Type I Diabetes; Type II Diabetes Musculoskeletal Medical History: Past Medical History Notes: arthritis Neurologic Medical History: Positive for: Neuropathy - Feet and finger Negative for: Seizure Disorder Past Medical History Notes: restless leg syndrome Oncologic Medical History: Positive for: Received Chemotherapy - 2012; Received Radiation - 2012 HBO Extended History Items Eyes: Cataracts Immunizations Pneumococcal Vaccine: Received Pneumococcal Vaccination: Yes Received Pneumococcal Vaccination On or After 60th Birthday: Yes Implantable Devices No devices added Hospitalization / Surgery History Type of Hospitalization/Surgery Lower extremity venography 2020 Intravascular ultrasound peripheral vascular intervention melanoma 2011 Family and Social History Cancer: Yes - Mother,Paternal Grandparents; Diabetes: Yes - Siblings; Heart Disease: Yes - Father; Hypertension: Yes - Father; Former smoker - quit in [employer]) Signed: 10/01/2023 5:33:59 PM By: Amber Guess MD FACS Entered By: [name] on 10/01/2023 16:52:08 SuperBill Details -------------------------------------------------------------------------------- [name] ([id number]) [id number] Page 14 of 14 Patient Name: Date of Service: DELECIA, CUADRA 10/01/2023 Medical Record Number: [id number] Patient Account Number: [id number] Date of Birth/Sex:  Treating RN: [birth year] (77 y.o. F) Primary Care Provider: Eustaquio [name] Other Clinician: Referring Provider: Treating Provider/Extender: [name] in Treatment: 61 Diagnosis Coding ICD-10 Codes Code Description [phone] Non-pressure chronic ulcer of other part of left lower leg with fat layer exposed R60.0 Localized edema L97.818 Non-pressure chronic ulcer of other part of right lower leg with other specified severity I83.893 Varicose veins of bilateral lower extremities with other complications I87.2 Venous insufficiency (chronic) (peripheral) G62.9 Polyneuropathy, unspecified Facility Procedures CPT4 Code Description Modifier Quantity [id number] 11042 - DEB SUBQ TISSUE 20 SQ CM/< 1 ICD-10 Diagnosis Description L97.822 Non-pressure chronic  ulcer of other part of left lower leg with fat layer exposed L97.818 Non-pressure chronic ulcer of other part of right lower leg with other specified severity Physician Procedures Quantity CPT4 Code Description Modifier [id number] 99214 - WC PHYS LEVEL 4 - EST PT 1 ICD-10 Diagnosis Description L97.822 Non-pressure chronic ulcer of other part of left lower leg with fat layer exposed L97.818 Non-pressure chronic ulcer of other part of right lower leg with other specified severity R60.0 Localized edema I83.893 Varicose veins of bilateral lower extremities with other complications [id number] 11042 - WC PHYS SUBQ TISS 20 SQ CM 1 ICD-10 Diagnosis Description L97.822 Non-pressure chronic ulcer of other part of left lower leg with fat layer exposed L97.818 Non-pressure chronic ulcer of other part of right lower leg with other specified severity Electronic Signature(s) Signed: 10/01/2023 4:55:39 PM By: Amber Guess MD FACS Entered By: [name] on 10/01/2023 16:55:38

## 2023-10-09 ENCOUNTER — Encounter (HOSPITAL_BASED_OUTPATIENT_CLINIC_OR_DEPARTMENT_OTHER): Payer: Medicare Other | Attending: Internal Medicine | Admitting: Internal Medicine

## 2023-10-09 DIAGNOSIS — L97822 Non-pressure chronic ulcer of other part of left lower leg with fat layer exposed: Secondary | ICD-10-CM | POA: Diagnosis not present

## 2023-10-09 DIAGNOSIS — L97818 Non-pressure chronic ulcer of other part of right lower leg with other specified severity: Secondary | ICD-10-CM | POA: Insufficient documentation

## 2023-10-09 DIAGNOSIS — Z86718 Personal history of other venous thrombosis and embolism: Secondary | ICD-10-CM | POA: Diagnosis not present

## 2023-10-09 DIAGNOSIS — G629 Polyneuropathy, unspecified: Secondary | ICD-10-CM | POA: Diagnosis not present

## 2023-10-09 DIAGNOSIS — I10 Essential (primary) hypertension: Secondary | ICD-10-CM | POA: Insufficient documentation

## 2023-10-09 DIAGNOSIS — I890 Lymphedema, not elsewhere classified: Secondary | ICD-10-CM | POA: Insufficient documentation

## 2023-10-09 DIAGNOSIS — J45909 Unspecified asthma, uncomplicated: Secondary | ICD-10-CM | POA: Insufficient documentation

## 2023-10-13 ENCOUNTER — Other Ambulatory Visit: Payer: Self-pay | Admitting: Family Medicine

## 2023-10-13 DIAGNOSIS — G629 Polyneuropathy, unspecified: Secondary | ICD-10-CM

## 2023-10-14 NOTE — Telephone Encounter (Signed)
ERx 

## 2023-10-14 NOTE — Progress Notes (Signed)
[name], Amber ([id number]) [id number] Page 1 of 5 Visit Report for 10/09/2023 Arrival Information Details Patient Name: Date of Service: [name], [name] 10/09/2023 2:15 PM Medical Record Number: [id number] Patient Account Number: [id number] Date of Birth/Sex: Treating RN: December 13, 1945 (77 y.o. F) Primary Care [name]: [name] Other Clinician: Referring Secilia Apps: Treating [name]/Extender: Zerita Boers in Treatment: 62 Visit Information History Since Last Visit Added or deleted any medications: No Patient Arrived: Ambulatory Any new allergies or adverse reactions: No Arrival Time: 14:22 Had a fall or experienced change in No Accompanied By: self activities of daily living that may affect Transfer Assistance: None risk of falls: Patient Identification Verified: Yes Signs or symptoms of abuse/neglect since last visito No Secondary Verification Process Completed: Yes Hospitalized since last visit: No Patient Requires Transmission-Based Precautions: No Implantable device outside of the clinic excluding No Patient Has Alerts: No cellular tissue based products placed in the center since last visit: Has Dressing in Place as Prescribed: Yes Has Compression in Place as Prescribed: Yes Pain Present Now: No Electronic Signature(s) Signed: 10/14/2023 4:12:01 PM By: Thayer Dallas Entered By: Thayer Dallas on 10/09/2023 14:22:22 -------------------------------------------------------------------------------- Compression Therapy Details Patient Name: Date of Service: [name] 10/09/2023 2:15 PM Medical Record Number: [id number] Patient Account Number: [id number] Date of Birth/Sex: Treating RN: Dec 25, 1945 (77 y.o. F) Primary Care [name]: [name] Other Clinician: Referring [name]: Treating [name]/Extender: Zerita Boers in Treatment: 36 Compression Therapy Performed for Wound  Assessment: Wound #2 Left,Posterior Lower Leg Performed By: Clinician Thayer Dallas, Compression Type: Double Layer Electronic Signature(s) Signed: 10/14/2023 4:12:01 PM By: Thayer Dallas Entered By: Thayer Dallas on 10/09/2023 16:35:32 -------------------------------------------------------------------------------- Compression Therapy Details Patient Name: Date of Service: [name], [name] 10/09/2023 2:15 PM Medical Record Number: [id number] Patient Account Number: [id number] [name], [name] ([id number]) [id number] Page 2 of 5 Date of Birth/Sex: Treating RN: [birth year] (77 y.o. F) Primary Care Champion Corales: Other Clinician: Eustaquio [name] Referring [name]: Treating [name]/Extender: Zerita Boers in Treatment: 59 Compression Therapy Performed for Wound Assessment: Wound #6 Right,Posterior Lower Leg Performed By: Clinician Thayer Dallas, Compression Type: Double Layer Electronic Signature(s) Signed: 10/14/2023 4:12:01 PM By: Thayer Dallas Entered By: Thayer Dallas on 10/09/2023 16:35:32 -------------------------------------------------------------------------------- Encounter Discharge Information Details Patient Name: Date of Service: [name], [name] 10/09/2023 2:15 PM Medical Record Number: [id number] Patient Account Number: [id number] Date of Birth/Sex: Treating RN: November 21, 1945 (77 y.o. F) Primary Care [name]: [name] Other Clinician: Thayer Dallas Referring Krishon Adkison: Treating [name]/Extender: Zerita Boers in Treatment: 29 Encounter Discharge Information Items Discharge Condition: Stable Ambulatory Status: Ambulatory Discharge Destination: Home Transportation: Private Auto Accompanied By: self Schedule Follow-up Appointment: Yes Clinical Summary of Care: Electronic Signature(s) Signed: 10/14/2023 4:12:01 PM By: Thayer Dallas Entered By: Thayer Dallas on 10/09/2023  16:36:20 -------------------------------------------------------------------------------- Patient/Caregiver Education Details Patient Name: Date of Service: [name] 12/4/2024andnbsp2:15 PM Medical Record Number: [id number] Patient Account Number: [id number] Date of Birth/Gender: Treating RN: 1945/11/11 (77 y.o. F) Primary Care Physician: [name] Other Clinician: Thayer Dallas Referring Physician: Treating Physician/Extender: Zerita Boers in Treatment: 61 Education Assessment Education Provided To: Patient Education Topics Provided Electronic Signature(s) Signed: 10/14/2023 4:12:01 PM By: Thayer Dallas Entered By: Thayer Dallas on 10/09/2023 16:36:03 Amber, [name] ([id number]) [id number] Page 3 of 5 -------------------------------------------------------------------------------- Wound Assessment Details Patient Name: Date of Service: ADELYNE, [name] 10/09/2023 2:15 PM Medical Record Number: [id number] Patient Account Number: [id number] Date of Birth/Sex: Treating RN: [birth year] (77 y.o. F) Primary Care [name]:  [name] Other Clinician: Referring [name]: Treating [name]/Extender: Zerita Boers in Treatment: 62 Wound Status Wound Number: 2 Primary Etiology: Venous Leg Ulcer Wound Location: Left, Posterior Lower Leg Wound Status: Open Wounding Event: Blister Date Acquired: 07/06/2022 Weeks Of Treatment: 62 Clustered Wound: Yes Wound Measurements Length: (cm) 3.7 Width: (cm) 1.6 Depth: (cm) 0.1 Area: (cm) 4.65 Volume: (cm) 0.465 % Reduction in Area: 32.3% % Reduction in Volume: 32.3% Wound Description Classification: Full Thickness Without Exposed Suppor Exudate Amount: Medium Exudate Type: Serosanguineous Exudate Color: red, brown t Structures Periwound Skin Texture Texture Color No Abnormalities Noted: No No Abnormalities Noted: No Moisture No  Abnormalities Noted: No Treatment Notes Wound #2 (Lower Leg) Wound Laterality: Left, Posterior Cleanser Soap and Water Discharge Instruction: May shower and wash wound with dial antibacterial soap and water prior to dressing change. Peri-Wound Care Ketoconazole Cream 2% Discharge Instruction: Apply Ketoconazole as directed Triamcinolone 15 (g) Discharge Instruction: Use triamcinolone 15 (g) as directed Zinc Oxide Ointment 30g tube Discharge Instruction: Apply Zinc Oxide to periwound with each dressing change Sween Lotion (Moisturizing lotion) Discharge Instruction: Apply moisturizing lotion as directed Topical Gentamicin Discharge Instruction: As directed by physician Mupirocin Ointment Discharge Instruction: Apply Mupirocin (Bactroban) as instructed Skintegrity Hydrogel 4 (oz) Discharge Instruction: Apply hydrogel as directed Primary Dressing Promogran Prisma Matrix, 4.34 (sq in) (silver collagen) Discharge Instruction: Moisten collagen with saline or hydrogel BLAKELEY, AKRAM ([id number]) [id number] Page 4 of 5 Secondary Dressing Optifoam Non-Adhesive Dressing, 4x4 in Discharge Instruction: Apply over primary dressing as directed. Secured With Compression Wrap CoFlex Calamine Unna Boot 4 x 6 (in/yd) Discharge Instruction: Apply Coflex Calamine [employer] as directed. Compression Stockings Add-Ons Electronic Signature(s) Signed: 10/14/2023 4:12:01 PM By: Thayer Dallas Entered By: Thayer Dallas on 10/09/2023 16:35:07 -------------------------------------------------------------------------------- Wound Assessment Details Patient Name: Date of Service: [name], [name] 10/09/2023 2:15 PM Medical Record Number: [id number] Patient Account Number: [id number] Date of Birth/Sex: Treating RN: 01/25/1946 (77 y.o. F) Primary Care Zivah Mayr: [name] Other Clinician: Referring Darienne Belleau: Treating Thadd Apuzzo/Extender: Zerita Boers in Treatment: 62 Wound Status Wound Number: 6 Primary Etiology: Lymphedema Wound Location: Right, Posterior Lower Leg Wound Status: Open Wounding Event: Gradually Appeared Date Acquired: 10/01/2023 Weeks Of Treatment: 1 Clustered Wound: No Wound Measurements Length: (cm) 0.9 Width: (cm) 0.5 Depth: (cm) 0.1 Area: (cm) 0.353 Volume: (cm) 0.035 % Reduction in Area: 0% % Reduction in Volume: 0% Wound Description Classification: Full Thickness Without Exposed Suppor Exudate Amount: Small Exudate Type: Serosanguineous Exudate Color: red, brown t Structures Periwound Skin Texture Texture Color No Abnormalities Noted: No No Abnormalities Noted: No Moisture No Abnormalities Noted: No Treatment Notes Wound #6 (Lower Leg) Wound Laterality: Right, Posterior Cleanser Soap and Water Discharge Instruction: May shower and wash wound with dial antibacterial soap and water prior to dressing change. Peri-Wound Care Sween Lotion (Moisturizing lotion) Discharge Instruction: Apply moisturizing lotion as directed [name], [name] ([id number]) [id number] Page 5 of 5 Topical Primary Dressing Promogran Prisma Matrix, 4.34 (sq in) (silver collagen) Discharge Instruction: Moisten collagen with saline or hydrogel Secondary Dressing Woven Gauze Sponge, Non-Sterile 4x4 in Discharge Instruction: Apply over primary dressing as directed. Secured With Compression Wrap CoFlex Calamine Unna Boot 4 x 6 (in/yd) Discharge Instruction: Apply Coflex Calamine [employer] as directed. Compression Stockings Add-Ons Electronic Signature(s) Signed: 10/14/2023 4:12:01 PM By: Thayer Dallas Entered By: Thayer Dallas on 10/09/2023 16:35:07 -------------------------------------------------------------------------------- Vitals Details Patient Name: Date of Service: [name] 10/09/2023 2:15 PM Medical Record Number: [id number] Patient Account Number:  [id number] Date  of Birth/Sex: Treating RN: 1946/06/11 (77 y.o. F) Primary Care Budd Freiermuth: [name] Other Clinician: Referring Liddie Chichester: Treating Joram Venson/Extender: Zerita Boers in Treatment: 62 Vital Signs Time Taken: 14:22 Reference Range: 80 - 120 mg / dl Height (in): 63 Weight (lbs): 200 Body Mass Index (BMI): 35.4 Electronic Signature(s) Signed: 10/14/2023 4:12:01 PM By: Thayer Dallas Entered By: Thayer Dallas on 10/09/2023 14:22:32

## 2023-10-14 NOTE — Progress Notes (Signed)
LINCY, HUNDT ([id number]) [id number] Page 1 of 1 Visit Report for 10/09/2023 SuperBill Details Patient Name: Date of Service: [name], [name] 10/09/2023 Medical Record Number: [id number] Patient Account Number: [id number] Date of Birth/Sex: Treating RN: 10-May-1946 (77 y.o. F) Primary Care Provider: Eustaquio Boyden Other Clinician: Thayer Dallas Referring Provider: Treating Provider/Extender: Zerita Boers in Treatment: 62 Diagnosis Coding ICD-10 Codes Code Description [phone] Non-pressure chronic ulcer of other part of left lower leg with fat layer exposed R60.0 Localized edema L97.818 Non-pressure chronic ulcer of other part of right lower leg with other specified severity I83.893 Varicose veins of bilateral lower extremities with other complications I87.2 Venous insufficiency (chronic) (peripheral) G62.9 Polyneuropathy, unspecified Facility Procedures CPT4 Description Modifier Quantity Code [id number] BILATERAL: Application of multi-layer venous compression system; leg (below knee), including ankle and 1 foot. Electronic Signature(s) Signed: 10/10/2023 4:45:28 AM By: Baltazar Najjar MD Signed: 10/14/2023 4:12:01 PM By: Thayer Dallas Entered By: Thayer Dallas on 10/09/2023 16:36:42

## 2023-10-16 ENCOUNTER — Encounter (HOSPITAL_BASED_OUTPATIENT_CLINIC_OR_DEPARTMENT_OTHER): Payer: Medicare Other | Admitting: Internal Medicine

## 2023-10-16 DIAGNOSIS — J45909 Unspecified asthma, uncomplicated: Secondary | ICD-10-CM | POA: Diagnosis not present

## 2023-10-16 DIAGNOSIS — G629 Polyneuropathy, unspecified: Secondary | ICD-10-CM | POA: Diagnosis not present

## 2023-10-16 DIAGNOSIS — L97818 Non-pressure chronic ulcer of other part of right lower leg with other specified severity: Secondary | ICD-10-CM | POA: Diagnosis not present

## 2023-10-16 DIAGNOSIS — I10 Essential (primary) hypertension: Secondary | ICD-10-CM | POA: Diagnosis not present

## 2023-10-16 DIAGNOSIS — L97822 Non-pressure chronic ulcer of other part of left lower leg with fat layer exposed: Secondary | ICD-10-CM | POA: Diagnosis not present

## 2023-10-16 DIAGNOSIS — I890 Lymphedema, not elsewhere classified: Secondary | ICD-10-CM | POA: Diagnosis not present

## 2023-10-17 DIAGNOSIS — D3132 Benign neoplasm of left choroid: Secondary | ICD-10-CM | POA: Insufficient documentation

## 2023-10-17 DIAGNOSIS — Z961 Presence of intraocular lens: Secondary | ICD-10-CM | POA: Diagnosis not present

## 2023-10-17 DIAGNOSIS — H25811 Combined forms of age-related cataract, right eye: Secondary | ICD-10-CM | POA: Diagnosis not present

## 2023-10-17 NOTE — Progress Notes (Signed)
[name], [name] ([id number]) [id number] Page 1 of 1 Visit Report for 10/16/2023 SuperBill Details Patient Name: Date of Service: [name], [name] 10/16/2023 Medical Record Number: [id number] Patient Account Number: [id number] Date of Birth/Sex: Treating RN: 01-Apr-1946 (77 y.o. F) Primary Care Provider: Eustaquio Boyden Other Clinician: Referring Provider: Treating Provider/Extender: Zerita Boers in Treatment: 63 Diagnosis Coding ICD-10 Codes Code Description [phone] Non-pressure chronic ulcer of other part of left lower leg with fat layer exposed R60.0 Localized edema L97.818 Non-pressure chronic ulcer of other part of right lower leg with other specified severity I83.893 Varicose veins of bilateral lower extremities with other complications I87.2 Venous insufficiency (chronic) (peripheral) G62.9 Polyneuropathy, unspecified Facility Procedures CPT4 Code Description Modifier Quantity [phone] - APPLY UNNA BOOT/PROFO BILATERAL 1 Electronic Signature(s) Signed: 10/16/2023 5:06:36 PM By: Thayer Dallas Signed: 10/16/2023 5:18:28 PM By: Baltazar Najjar MD Entered By: Thayer Dallas on 10/16/2023 14:04:22

## 2023-10-17 NOTE — Progress Notes (Signed)
[name], [name] ([id number]) [id number] Page 1 of 5 Visit Report for 10/16/2023 Arrival Information Details Patient Name: Date of Service: [name], [name] 10/16/2023 2:15 PM Medical Record Number: [id number] Patient Account Number: [id number] Date of Birth/Sex: Treating RN: [name] (77 y.o. F) Primary Care [name]: [name] Other Clinician: Referring [name]: Treating [name]/Extender: [name]: 67 Visit Information History Since Last Visit Added or deleted any medications: No Patient Arrived: Ambulatory Any new allergies or adverse reactions: No Arrival Time: 15:18 Had a fall or experienced change in No Accompanied By: self activities of daily living that may affect Transfer Assistance: None risk of falls: Patient Identification Verified: Yes Signs or symptoms of abuse/neglect since last visito No Secondary Verification Process Completed: Yes Hospitalized since last visit: No Patient Requires Transmission-Based Precautions: No Implantable device outside of the clinic excluding No Patient Has Alerts: No cellular tissue based products placed in the center since last visit: Has Dressing in Place as Prescribed: Yes Has Compression in Place as Prescribed: Yes Pain Present Now: No Electronic Signature(s) Signed: 10/16/2023 5:06:36 PM By: [name] Entered By: [name] on 10/16/2023 12:18:26 -------------------------------------------------------------------------------- Compression Therapy Details Patient Name: Date of Service: [name] 10/16/2023 2:15 PM Medical Record Number: [id number] Patient Account Number: [id number] Date of Birth/Sex: Treating RN: [name] (77 y.o. F) Primary Care [name]: [name] Other Clinician: Referring [name]: Treating [name]/Extender: [name]: 24 Compression Therapy Performed for Wound  Assessment: Wound #2 Left,Posterior Lower Leg Performed By: Clinician [name], Compression Type: Double Layer Electronic Signature(s) Signed: 10/16/2023 5:06:36 PM By: [name] Entered By: [name] on 10/16/2023 14:02:45 -------------------------------------------------------------------------------- Compression Therapy Details Patient Name: Date of Service: [name], [name] 10/16/2023 2:15 PM Medical Record Number: [id number] Patient Account Number: [id number] [name], [name] ([id number]) [id number].pdf Page 2 of 5 Date of Birth/Sex: Treating RN: [name] (77 y.o. F) Primary Care Amylah [name]: Other Clinician: Eustaquio [name] Referring [name]: Treating [name]/Extender: [name]: 66 Compression Therapy Performed for Wound Assessment: Wound #6 Right,Posterior Lower Leg Performed By: Clinician [name], Compression Type: Double Layer Electronic Signature(s) Signed: 10/16/2023 5:06:36 PM By: [name] Entered By: [name] on 10/16/2023 14:02:45 -------------------------------------------------------------------------------- Encounter Discharge Information Details Patient Name: Date of Service: [name], [name] 10/16/2023 2:15 PM Medical Record Number: [id number] Patient Account Number: [id number] Date of Birth/Sex: Treating RN: [name] (77 y.o. F) Primary Care [name]: [name] Other Clinician: Thayer [name] Referring [name]: Treating [name]/Extender: [name]: 52 Encounter Discharge Information Items Discharge Condition: Stable Ambulatory Status: Ambulatory Discharge Destination: Home Transportation: Private Auto Accompanied By: self Schedule Follow-up Appointment: Yes Clinical Summary of Care: Electronic Signature(s) Signed: 10/16/2023 5:06:36 PM By: [name] Entered By: [name] on 10/16/2023  14:03:45 -------------------------------------------------------------------------------- Patient/Caregiver Education Details Patient Name: Date of Service: [name] 12/11/2024andnbsp2:15 PM Medical Record Number: [id number] Patient Account Number: [id number] Date of Birth/Gender: Treating RN: [birth year] (77 y.o. F) Primary Care Physician: [name] Other Clinician: Thayer [name] Referring Physician: Treating Physician/Extender: [name]: 68 Education Assessment Education Provided To: Patient Education Topics Provided Electronic Signature(s) Signed: 10/16/2023 5:06:36 PM By: [name] Entered By: [name] on 10/16/2023 14:03:27 [name], [name] ([id number]) [id number] Page 3 of 5 -------------------------------------------------------------------------------- Wound Assessment Details Patient Name: Date of Service: [name], [name] 10/16/2023 2:15 PM Medical Record Number: [id number] Patient Account Number: [id number] Date of Birth/Sex: Treating RN: [name] (77 y.o. F) Primary Care Ayleen Mckinstry:  [name] Other Clinician: Referring Shakai Dolley: Treating Aydeen Blume/Extender: [name]: 63 Wound Status Wound Number: 2 Primary Etiology: Venous Leg Ulcer Wound Location: Left, Posterior Lower Leg Wound Status: Open Wounding Event: Blister Date Acquired: 07/06/2022 Weeks Of [name]: 63 Clustered Wound: Yes Wound Measurements Length: (cm) 3.7 Width: (cm) 1.6 Depth: (cm) 0.1 Area: (cm) 4.65 Volume: (cm) 0.465 % Reduction in Area: 32.3% % Reduction in Volume: 32.3% Wound Description Classification: Full Thickness Without Exposed Suppor Exudate Amount: Medium Exudate Type: Serosanguineous Exudate Color: red, brown t Structures Periwound Skin Texture Texture Color No Abnormalities Noted: No No Abnormalities Noted: No Moisture No  Abnormalities Noted: No [name] Notes Wound #2 (Lower Leg) Wound Laterality: Left, Posterior Cleanser Soap and Water Discharge Instruction: May shower and wash wound with dial antibacterial soap and water prior to dressing change. Peri-Wound Care Ketoconazole Cream 2% Discharge Instruction: Apply Ketoconazole as directed Triamcinolone 15 (g) Discharge Instruction: Use triamcinolone 15 (g) as directed Zinc Oxide Ointment 30g tube Discharge Instruction: Apply Zinc Oxide to periwound with each dressing change Sween Lotion (Moisturizing lotion) Discharge Instruction: Apply moisturizing lotion as directed Topical Gentamicin Discharge Instruction: As directed by physician Mupirocin Ointment Discharge Instruction: Apply Mupirocin (Bactroban) as instructed Skintegrity Hydrogel 4 (oz) Discharge Instruction: Apply hydrogel as directed Primary Dressing Promogran Prisma Matrix, 4.34 (sq in) (silver collagen) Discharge Instruction: Moisten collagen with saline or hydrogel [name], [name] ([id number]) [id number] Page 4 of 5 Secondary Dressing Optifoam Non-Adhesive Dressing, 4x4 in Discharge Instruction: Apply over primary dressing as directed. Secured With Compression Wrap CoFlex Calamine Unna Boot 4 x 6 (in/yd) Discharge Instruction: Apply Coflex [employer]amine [employer] as directed. Compression Stockings Add-Ons Electronic Signature(s) Signed: 10/16/2023 5:06:36 PM By: [name] Entered By: [name] on 10/16/2023 12:18:45 -------------------------------------------------------------------------------- Wound Assessment Details Patient Name: Date of Service: [name], [name] 10/16/2023 2:15 PM Medical Record Number: [id number] Patient Account Number: [id number] Date of Birth/Sex: Treating RN: [name] (77 y.o. F) Primary Care Marlyn Rabine: [name] Other Clinician: Referring Lachell Rochette: Treating Shelbylynn Walczyk/Extender: [name]: 63 Wound Status Wound Number: 6 Primary Etiology: Lymphedema Wound Location: Right, Posterior Lower Leg Wound Status: Open Wounding Event: Gradually Appeared Date Acquired: 10/01/2023 Weeks Of [name]: 2 Clustered Wound: No Wound Measurements Length: (cm) 0.9 Width: (cm) 0.5 Depth: (cm) 0.1 Area: (cm) 0.353 Volume: (cm) 0.035 % Reduction in Area: 0% % Reduction in Volume: 0% Wound Description Classification: Full Thickness Without Exposed Suppor Exudate Amount: Small Exudate Type: Serosanguineous Exudate Color: red, brown t Structures Periwound Skin Texture Texture Color No Abnormalities Noted: No No Abnormalities Noted: No Moisture No Abnormalities Noted: No [name] Notes Wound #6 (Lower Leg) Wound Laterality: Right, Posterior Cleanser Soap and Water Discharge Instruction: May shower and wash wound with dial antibacterial soap and water prior to dressing change. Peri-Wound Care Sween Lotion (Moisturizing lotion) Discharge Instruction: Apply moisturizing lotion as directed [name], [name] ([id number]) [phone].pdf Page 5 of 5 Topical Primary Dressing Promogran Prisma Matrix, 4.34 (sq in) (silver collagen) Discharge Instruction: Moisten collagen with saline or hydrogel Secondary Dressing Woven Gauze Sponge, Non-Sterile 4x4 in Discharge Instruction: Apply over primary dressing as directed. Secured With Compression Wrap CoFlex Calamine Unna Boot 4 x 6 (in/yd) Discharge Instruction: Apply Coflex Calamine [employer] as directed. Compression Stockings Add-Ons Electronic Signature(s) Signed: 10/16/2023 5:06:36 PM By: [name] Entered By: [name] on 10/16/2023 12:18:45 -------------------------------------------------------------------------------- Vitals Details Patient Name: Date of Service: [name] 10/16/2023 2:15 PM Medical Record Number: [id number] Patient Account Number:  [id number] Date of Birth/Sex: Treating RN: [birth year] (77 y.o. F) Primary Care Maurya Nethery: [name] Other Clinician: Referring Saphire Barnhart: Treating Marguarite Markov/Extender: [name]: 63 Vital Signs Time Taken: 15:18 Reference Range: 80 - 120 mg / dl Height (in): 63 Weight (lbs): 200 Body Mass Index (BMI): 35.4 Electronic Signature(s) Signed: 10/16/2023 5:06:36 PM By: [name] Entered By: [name] on 10/16/2023 12:18:32

## 2023-10-21 DIAGNOSIS — H25811 Combined forms of age-related cataract, right eye: Secondary | ICD-10-CM | POA: Insufficient documentation

## 2023-10-23 ENCOUNTER — Encounter (HOSPITAL_BASED_OUTPATIENT_CLINIC_OR_DEPARTMENT_OTHER): Payer: Medicare Other | Admitting: General Surgery

## 2023-10-23 DIAGNOSIS — L97822 Non-pressure chronic ulcer of other part of left lower leg with fat layer exposed: Secondary | ICD-10-CM | POA: Diagnosis not present

## 2023-10-23 DIAGNOSIS — I890 Lymphedema, not elsewhere classified: Secondary | ICD-10-CM | POA: Diagnosis not present

## 2023-10-23 DIAGNOSIS — L97222 Non-pressure chronic ulcer of left calf with fat layer exposed: Secondary | ICD-10-CM | POA: Diagnosis not present

## 2023-10-23 DIAGNOSIS — I10 Essential (primary) hypertension: Secondary | ICD-10-CM | POA: Diagnosis not present

## 2023-10-23 DIAGNOSIS — G629 Polyneuropathy, unspecified: Secondary | ICD-10-CM | POA: Diagnosis not present

## 2023-10-23 DIAGNOSIS — L97818 Non-pressure chronic ulcer of other part of right lower leg with other specified severity: Secondary | ICD-10-CM | POA: Diagnosis not present

## 2023-10-23 DIAGNOSIS — I872 Venous insufficiency (chronic) (peripheral): Secondary | ICD-10-CM | POA: Diagnosis not present

## 2023-10-23 DIAGNOSIS — J45909 Unspecified asthma, uncomplicated: Secondary | ICD-10-CM | POA: Diagnosis not present

## 2023-10-24 NOTE — Progress Notes (Signed)
DENIESHA, [name] ([id number]) [id number] Page 1 of 11 Visit Report for 10/23/2023 Chief Complaint Document Details Patient Name: Date of Service: [name], [name] 10/23/2023 3:45 PM Medical Record Number: [id number] Patient Account Number: [id number] Date of Birth/Sex: Treating RN: 01/13/1946 (77 y.o. F) Primary Care Provider: Eustaquio Boyden Other Clinician: Referring Provider: Treating Provider/Extender: Priscille Loveless in Treatment: 77 Information Obtained from: Patient Chief Complaint Patient presents for treatment of an open ulcer due to venous insufficiency Electronic Signature(s) Signed: 10/23/2023 4:43:35 PM By: Duanne Guess MD FACS Entered By: Duanne Guess on 10/23/2023 16:32:40 -------------------------------------------------------------------------------- Debridement Details Patient Name: Date of Service: [name], [name] 10/23/2023 3:45 PM Medical Record Number: [id number] Patient Account Number: [id number] Date of Birth/Sex: Treating RN: 22-Jun-1946 (77 y.o. Tommye Standard Primary Care Provider: Eustaquio Boyden Other Clinician: Referring Provider: Treating Provider/Extender: Priscille Loveless in Treatment: 77 Debridement Performed for Assessment: Wound #2 Left,Posterior Lower Leg Performed By: Physician Duanne Guess, MD The following information was scribed by: Zenaida Deed The information was scribed for: Duanne Guess Debridement Type: Debridement Severity of Tissue Pre Debridement: Fat layer exposed Level of Consciousness (Pre-procedure): Awake and Alert Pre-procedure Verification/Time Out Yes - 16:15 Taken: Start Time: 16:18 Pain Control: Lidocaine 4% T opical Solution Percent of Wound Bed Debrided: 80% T Area Debrided (cm): otal 3.32 Tissue and other material debrided: Viable, Non-Viable, Slough, Subcutaneous, Slough Level: Skin/Subcutaneous  Tissue Debridement Description: Excisional Instrument: Curette Bleeding: Minimum Hemostasis Achieved: Pressure Procedural Pain: 0 Post Procedural Pain: 0 Response to Treatment: Procedure was tolerated well Level of Consciousness (Post- Awake and Alert procedure): Post Debridement Measurements of Total Wound Length: (cm) 3.3 Width: (cm) 1.6 Depth: (cm) 0.1 Volume: (cm) 0.415 [name], [name] ([id number]) [id number] Page 2 of 11 Character of Wound/Ulcer Post Debridement: Improved Severity of Tissue Post Debridement: Fat layer exposed Post Procedure Diagnosis Same as Pre-procedure Electronic Signature(s) Signed: 10/23/2023 4:43:35 PM By: Duanne Guess MD FACS Signed: 10/23/2023 5:22:11 PM By: Zenaida Deed RN, [name] Entered By: Zenaida Deed on 10/23/2023 16:22:00 -------------------------------------------------------------------------------- HPI Details Patient Name: Date of Service: [name], [name] 10/23/2023 3:45 PM Medical Record Number: [id number] Patient Account Number: [id number] Date of Birth/Sex: Treating RN: 1946-05-01 (77 y.o. F) Primary Care Provider: Eustaquio Boyden Other Clinician: Referring Provider: Treating Provider/Extender: Priscille Loveless in Treatment: 6 History of Present Illness HPI Description: ADMISSION This is a 77 year old woman with a history of chronic venous insufficiency status post saphenous vein ablations in 2010 and 2016. 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 She presents today with an open venous ulcer on her left lower leg. It has been present for a little over 2 weeks. She saw her primary care provider who apparently swabbed the wound and grew out Pseudomonas. She just completed a course of ciprofloxacin for this. ABI was 0.91. She reports that she has had previous issues with ulcers in this same location, stemming back to a punch biopsy taken by a  dermatologist many years ago. She has had several skin substitutes applied to the area that have ultimately resulted in healing on prior occasions. She has 2 small ulcers on her left medial lower leg. There is slough accumulation in both of them. The more anterior of the 2 is quite small and has some soft slough on the surface, underneath which there is good granulation tissue. The more medial wound also has slough accumulation, but the underlying surface is fairly fibrotic and gritty. This is consistent with her  provided history of multiple ulcers in the same location. 08/03/2022: The anterior wound is smaller today with just a little bit of slough accumulation. The more medial wound continues to be very sensitive and fibrotic with slough buildup. 08/13/2022: The anterior wound is closed. The more medial wound remains sensitive with a fairly fibrotic surface. There is more granulation tissue filling in, however, and there is less slough than on prior occasions. 08/20/2022: The anterior wound remains closed. The more medial wound has filled with granulation tissue but still has a fair amount of slough on the surface and remains fairly tender. Unfortunately, she has developed 2 small ulcers just proximal to this. The fat layer is exposed in each. She says that over the weekend, she felt a burning sensation in that location. 08/27/2022: The 2 small wounds proximal to the main wound have merged into a single site. The wounds look a little bit dry, but they are quite clean without significant slough accumulation. They remain quite tender. 09/03/2022: All of the wounds have now merged into 1 large wound. There is a strong odor coming from the wound and the surface is not particularly viable. It is extremely painful for her today. 09/10/2022: The wound is less black and purple this week but still does not look particularly viable. The surface is desiccated. The culture that I took last week returned with a  polymicrobial population including Pseudomonas. I prescribed both Augmentin and levofloxacin. She has not yet picked up levofloxacin, as her pharmacy only notified her yesterday that it was ready. We have ordered a [employer] topical compounded antibiotic, but it has not yet come. 09/17/2022: The wound surface is markedly improved today. There is still an area of grayish-looking muscle but the rest appears significantly more viable. There is still a layer of slough on the surface. She still has a couple days left of oral antibiotic therapy. She has her Jodie Echevaria compounded topical antibiotic with her today. 09/24/2022: She has completed her oral antibiotic therapy. The wound surface is much cleaner today and more viable without any necrotic tissue. It is a bit desiccated, however. 10/01/2022: The moisture balance of the wound has improved. There is a layer of yellow slough on the surface, but beneath this, the wound is more pink. 10/08/2022: The wound is smaller and cleaner today with a layer of slough present. She is having less pain. 12/18; this patient has a new wound on the right lateral ankle which apparently started after a cat scratched her. Secondarily she managed to drop a cake pan on the same area. This is very painful. The original wound was on the left posterior calf we have been using [employer] under an [employer]. The Unna boot fell down and apparently she was in for a nurse visit perhaps last week and that 1 fell down as well This patient has central venous mediated hypertension. For member correctly she has a stent placed in the left common iliac artery by Dr. Randie Heinz some years ago [facility] Thurner syndrome] she also has had venous ablations and I believe a history of a DVT The patient has compression pumps at home but she does not use them SUNDEEP, HOLDERFIELD ([id number]) [id number] Page 3 of 11 10/30/2022: The patient says that her wounds burned all week with the  [facility] classic in place. She has a fair amount of slough and nonviable subcutaneous tissue present on the right ankle. The left posterior calf wound is cleaner than I have seen it to date.  Both remain fairly tender. 11/14/2022: Both wounds have substantial slough and eschar accumulation. They remain extremely tender. 11/21/2022: The wounds are cleaner this week, but still have slough and eschar accumulation. She continues to describe burning pain in both wounds, but upon further questioning, she actually has burning in both of her feet that radiates up her legs and includes to the wounds. 11/28/2022: Both wounds have slough and eschar accumulation, as well as adherent silver alginate that was difficult to remove. She continues to have pain out of proportion to the extent of her wounds. Her PCP did initiate Lyrica which she started taking last night. The dose is quite low, only 25 mg, but apparently there is a plan for upward titration, assuming she tolerates the medication. 12/05/2022: The wounds are both a little bit smaller today, but have significant slough accumulation, as usual. They remain exquisitely tender. She is now taking Lyrica twice a day. 12/19/2022: Both of her wounds look significantly better this week. There is slough buildup on both, but they are smaller. She seems to be getting a good response from Lyrica as she is having much less pain. 12/26/2022: The wound on her right lateral ankle is nearly closed. The wound on her left posterior calf is smaller but still has a lot of slough accumulation. They remain tender. 01/02/2023: The right lateral ankle wound is down to just a couple of millimeters. It is much less tender than on prior occasions. There is minimal slough buildup. The wound on her posterior calf continues to contract, as well. It has thick slough buildup and remains fairly tender. 01/09/2023: The right lateral ankle wound is nearly closed. He remains tender. There is just  a little eschar on the surface. The wound on her posterior calf has improved quite a bit. There is still slough on the surface, but the more proximal area has epithelialized substantially and there is no longer any gray discoloration to her tissue. 3/13; patient presents for follow-up. Her right lateral ankle wound is almost healed. She has a wound to her left posterior calf with slough and granulation tissue. We have been using Santyl under Unna boots bilaterally to the lower extremities. She has no issues or complaints today. 01/30/2023: The right lateral ankle wound is down to just a pinhole under a layer of eschar. The left posterior calf wound is quite a bit improved since the last time I saw it. There is still a layer of slough on the surface but there has been more epithelialization. 02/07/2023: The right lateral ankle wound still has some eschar on the surface. The left posterior calf wound is about the same size, but with more areas of epithelialization. 02/20/2023: The right lateral ankle wound is healed. The left posterior calf wound is smaller and is essentially flush with the surrounding skin, but the surface is quite dry. Her leg wrap slipped and her calf is quite swollen above the level of the wound. 03/01/2023: The right lateral ankle wound reopened and she also suffered a fall that resulted in a small wound on her right anterior tibial surface. The left posterior calf wound has a much better moisture balance today. There is a layer of slough on the surface. The [employer] worked much better for her as far as compression this week. 03/06/2023: The right lateral ankle wound has a layer of eschar on the surface and is too painful for me to debride. The anterior tibial surface wound is more or less healed; it is a very  superficial abrasion at this point. The left posterior calf wound has a layer of slough on the surface. Her edema control is better. 03/13/2023: The right lateral ankle wound has a  layer of eschar on the surface. Once this was debrided, it was found to be healed. The anterior tibial surface wound has a layer of stuck silver alginate on the surface. This was removed to reveal a very superficial abrasion but not much change from last week. The left posterior calf wound is smaller and has its usual layer of accumulated slough. 03/25/2023: The anterior tibial surface wound has eschar and silver alginate stuck to it. Once this was removed, the wound was found to be healed. The left posterior calf wound is roughly the same size and has a little bit thicker layer of accumulated slough. 04/03/2023: She has had more drainage from the posterior calf wound over the last week and the layer of slough is thicker. She also says it is more tender. 04/10/2023: Continued increase in drainage from her wound. The wound remains tender with a layer of slough on the surface. It does not appear as viable this week. She also has erythema, swelling, and warmth in her entire left lower leg extending into the midfoot. 04/17/2023: The wound is about the same size this week. There is fibrotic adherent slough, but the drainage is not as profuse. She is currently taking levofloxacin. She is complaining of an increase in the burning sensation at her wound. 04/24/2023: The wound measured smaller this week and is much cleaner-looking. She completed her course of levofloxacin. Her PCP increased her dose of Lyrica and she reports that the burning has stopped. 05/01/2023: The wound is smaller again today and quite clean with only minimal slough on the surface. 05/08/2023: The wound is smaller again today. It is fairly clean, but the surface is a bit fibrotic. Moisture balance is better. Her leg is more swollen and erythematous today, however. 05/15/2023: The wound is about the same size today. The surface has very little slough accumulation, but remains fibrotic. Moisture balance is good. She is responding nicely to the oral  Keflex she is taking and her leg is less erythematous and edematous. 05/22/2023: The wound is unchanged in size. There is quite a bit of slough on the surface but the moisture balance is better. It is still fibrotic, but a little bit less so. Her leg is no longer erythematous, but she does have some persistent edema. 05/29/2023: The wound measured a little bit smaller today. It is dry again and the surface is quite fibrotic. The edema in her leg has improved. 06/12/2023: The wound measures slightly larger today. The moisture balance has improved markedly. There is soft slough on the wound surface. Edema control is improved. 06/19/2023: The wound measured slightly smaller today. There is a band of epithelium dividing the most proximal portion of the wound from the remainder. There is slough accumulation and moisture balance is improved. 06/26/2023: The band of epithelium between the 2 open areas has gotten wider. There is some slough accumulation on the surfaces along with a little bit of eschar. Moisture balance continues to be good. 07/03/2023: The band of epithelium continues to expand. The moisture balance remains good and the surface, particularly of the more distal aspect of the wound continues to improve. Slough and eschar accumulation, as usual. 07/10/2023: The wound measured smaller today. Moisture balance is good and the tissue underneath the layer of slough that has accumulated continues to improve. Perrier, Clifford  J ([id number]) [id number] Page 4 of 11 07/17/2023: The wound is smaller again today. Moisture balance is appropriate. There is a little bit of slough overlying granulation tissue. 07/24/2023: The band of epithelium between the 2 open areas has gotten wider. The more proximal area has more epithelialization around the perimeter. Both have slough on the surface and remain fairly fibrotic. Moisture balance remains good. 08/07/2023: Both open areas are little bit  smaller today. The band of epithelium between the 2 continues to enlarge. There is slough and eschar on both surfaces. 08/14/2023: No significant change to the wound dimensions. The surface is rather dry and fibrotic again. 08/21/2023: The wound is smaller and the skin bridge between the 2 open areas has gotten wider. There is slough accumulation on the surfaces and the moisture balance is improved. 08/28/2023: Both open areas of the wound are smaller today. There is slough on the surface, but it is quite soft. Moisture balance is good. 09/04/2023: The band of epithelium between the 2 open areas continues to widen. The wound is very sensitive today, more so than usual. No concern for infection based upon visual inspection, however. There is some slough on the wound surface, which remains fairly fibrotic. 09/11/2023: Both open areas are little bit smaller today. There is slough accumulation on the surface. The underlying tissue is still somewhat fibrotic, but the color is good. Edema control is good. 09/17/2023: The wound continues to improve. The open areas are smaller and the band of skin has gotten wider. The tissue color is possibly the best that I have ever seen it although it does remain somewhat fibrotic. There is slough on the wound surface. 09/25/2023: The wound is smaller again today. The slough on the surface is rubbery but the underlying tissue continues to improve, quality-wise. It is less fibrotic today. Edema control is good. 10/01/2023: The wound continues to contract. There is slough on the surface. The underlying tissue continues to be less fibrotic from week to week. Unfortunately, she has reopened an area on her right posterior lower leg. It extends to the fat layer and there is slough accumulation on the surface. This occurred despite Unna boot compression. 10/23/2023: The right leg wound has healed. The wound on her left leg has contracted further. The band of skin between the 2  open areas has widened. There is some slough accumulation on the surface. Electronic Signature(s) Signed: 10/23/2023 4:43:35 PM By: Duanne Guess MD FACS Entered By: Duanne Guess on 10/23/2023 16:33:19 -------------------------------------------------------------------------------- Physical Exam Details Patient Name: Date of Service: [name], [name] 10/23/2023 3:45 PM Medical Record Number: [id number] Patient Account Number: [id number] Date of Birth/Sex: Treating RN: 05-Apr-1946 (77 y.o. F) Primary Care Provider: Eustaquio Boyden Other Clinician: Referring Provider: Treating Provider/Extender: Priscille Loveless in Treatment: 29 Constitutional Hypertensive, asymptomatic. . . . no acute distress. Respiratory Normal work of breathing on room air.. Notes 10/23/2023: The right leg wound has healed. The wound on her left leg has contracted further. The band of skin between the 2 open areas has widened. There is some slough accumulation on the surface. Electronic Signature(s) Signed: 10/23/2023 4:43:35 PM By: Duanne Guess MD FACS Entered By: Duanne Guess on 10/23/2023 16:33:48 -------------------------------------------------------------------------------- Physician Orders Details Patient Name: Date of Service: [name], [name] 10/23/2023 3:45 PM [name] ([id number]) [id number] Page 5 of 11 Medical Record Number: [id number] Patient Account Number: [id number] Date of Birth/Sex: Treating RN: 1946/07/24 (77 y.o. Tommye Standard Primary Care Provider: Eustaquio Boyden Other Clinician: Referring Provider:  Treating Provider/Extender: Priscille Loveless in Treatment: 77 The following information was scribed by: Zenaida Deed The information was scribed for: Duanne Guess Verbal / Phone Orders: No Diagnosis Coding ICD-10 Coding Code Description [phone] Non-pressure chronic ulcer of other  part of left lower leg with fat layer exposed R60.0 Localized edema I83.893 Varicose veins of bilateral lower extremities with other complications I87.2 Venous insufficiency (chronic) (peripheral) G62.9 Polyneuropathy, unspecified Follow-up Appointments ppointment in 1 week. - Dr. Lady Gary Return A 12/27 @ 10:00 am Anesthetic Wound #2 Left,Posterior Lower Leg (In clinic) Topical Lidocaine 4% applied to wound bed Bathing/ Shower/ Hygiene May shower with protection but do not get wound dressing(s) wet. Protect dressing(s) with water repellant cover (for example, large plastic bag) or a cast cover and may then take shower. Additional Orders / Instructions Other: - Recommend Nail Care Lymphedema Treatment Plan - Exercise, Compression and Elevation Bilateral Lower Extremities Exercise daily as tolerated. (Walking, ROM, Calf Pumps and Toe Taps) Compression Garments, Class II. Wear daily when out of bed. May remove at bedtime - right leg Compression Wraps as ordered - left leg Elevate legs 30 - 60 minutes at or above heart level at least 3 - 4 times daily as able/tolerated Avoid standing for long periods and elevate leg(s) parallel to the floor when sitting Use Pneumatic Compression Device on leg(s) 2-3 times a day for 45-60 minutes Wound Treatment Wound #2 - Lower Leg Wound Laterality: Left, Posterior Cleanser: Soap and Water 1 x Per Week/30 Days Discharge Instructions: May shower and wash wound with dial antibacterial soap and water prior to dressing change. Peri-Wound Care: Ketoconazole Cream 2% 1 x Per Week/30 Days Discharge Instructions: Apply Ketoconazole as directed Peri-Wound Care: Triamcinolone 15 (g) 1 x Per Week/30 Days Discharge Instructions: Use triamcinolone 15 (g) as directed Peri-Wound Care: Zinc Oxide Ointment 30g tube 1 x Per Week/30 Days Discharge Instructions: Apply Zinc Oxide to periwound with each dressing change Peri-Wound Care: Sween Lotion (Moisturizing lotion) 1 x  Per Week/30 Days Discharge Instructions: Apply moisturizing lotion as directed Topical: Gentamicin 1 x Per Week/30 Days Discharge Instructions: As directed by physician Topical: Mupirocin Ointment 1 x Per Week/30 Days Discharge Instructions: Apply Mupirocin (Bactroban) as instructed Topical: Skintegrity Hydrogel 4 (oz) 1 x Per Week/30 Days Discharge Instructions: Apply hydrogel as directed Prim Dressing: Promogran Prisma Matrix, 4.34 (sq in) (silver collagen) 1 x Per Week/30 Days ary Discharge Instructions: Moisten collagen with saline or hydrogel Secondary Dressing: Optifoam Non-Adhesive Dressing, 4x4 in 1 x Per Week/30 Days Discharge Instructions: Apply over primary dressing as directed. Compression Wrap: Urgo K2 Lite, (equivalent to a 3 layer) two layer compression system, regular 1 x Per Week/30 Days [name], [name] ([id number]) [id number] Page 6 of 11 Discharge Instructions: Apply Urgo K2 Lite as directed (alternative to 3 layer compression). Electronic Signature(s) Signed: 10/23/2023 4:43:35 PM By: Duanne Guess MD FACS Entered By: Duanne Guess on 10/23/2023 16:36:37 -------------------------------------------------------------------------------- Problem List Details Patient Name: Date of Service: [name], [name] 10/23/2023 3:45 PM Medical Record Number: [id number] Patient Account Number: [id number] Date of Birth/Sex: Treating RN: Jun 23, 1946 (77 y.o. Tommye Standard Primary Care Provider: Eustaquio Boyden Other Clinician: Referring Provider: Treating Provider/Extender: Priscille Loveless in Treatment: 32 Active Problems ICD-10 Encounter Code Description Active Date MDM Diagnosis [phone] Non-pressure chronic ulcer of other part of left lower leg with fat layer exposed 07/27/2022 No Yes R60.0 Localized edema 07/27/2022 No Yes I83.893 Varicose veins of bilateral lower extremities with other complications 07/27/2022 No  Yes I87.2 Venous insufficiency (chronic) (peripheral) 07/27/2022 No Yes G62.9 Polyneuropathy, unspecified 07/27/2022 No Yes Inactive Problems ICD-10 Code Description Active Date Inactive Date L03.115 Cellulitis of right lower limb 10/22/2022 10/22/2022 Resolved Problems ICD-10 Code Description Active Date Resolved Date L97.818 Non-pressure chronic ulcer of other part of right lower leg with other specified severity 10/01/2023 10/22/2022 Electronic Signature(s) Signed: 10/23/2023 4:43:35 PM By: Duanne Guess MD FACS Entered By: Duanne Guess on 10/23/2023 16:27:17 [name] ([id number]) [id number] Page 7 of 11 -------------------------------------------------------------------------------- Progress Note Details Patient Name: Date of Service: [name], [name] 10/23/2023 3:45 PM Medical Record Number: [id number] Patient Account Number: [id number] Date of Birth/Sex: Treating RN: 1946-05-25 (77 y.o. F) Primary Care Provider: Eustaquio Boyden Other Clinician: Referring Provider: Treating Provider/Extender: Priscille Loveless in Treatment: 61 Subjective Chief Complaint Information obtained from Patient Patient presents for treatment of an open ulcer due to venous insufficiency History of Present Illness (HPI) ADMISSION This is a 77 year old woman with a history of chronic venous insufficiency status post saphenous vein ablations in 2010 and 2016. 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 She presents today with an open venous ulcer on her left lower leg. It has been present for a little over 2 weeks. She saw her primary care provider who apparently swabbed the wound and grew out Pseudomonas. She just completed a course of ciprofloxacin for this. ABI was 0.91. She reports that she has had previous issues with ulcers in this same location, stemming back to a punch biopsy taken by a dermatologist  many years ago. She has had several skin substitutes applied to the area that have ultimately resulted in healing on prior occasions. She has 2 small ulcers on her left medial lower leg. There is slough accumulation in both of them. The more anterior of the 2 is quite small and has some soft slough on the surface, underneath which there is good granulation tissue. The more medial wound also has slough accumulation, but the underlying surface is fairly fibrotic and gritty. This is consistent with her provided history of multiple ulcers in the same location. 08/03/2022: The anterior wound is smaller today with just a little bit of slough accumulation. The more medial wound continues to be very sensitive and fibrotic with slough buildup. 08/13/2022: The anterior wound is closed. The more medial wound remains sensitive with a fairly fibrotic surface. There is more granulation tissue filling in, however, and there is less slough than on prior occasions. 08/20/2022: The anterior wound remains closed. The more medial wound has filled with granulation tissue but still has a fair amount of slough on the surface and remains fairly tender. Unfortunately, she has developed 2 small ulcers just proximal to this. The fat layer is exposed in each. She says that over the weekend, she felt a burning sensation in that location. 08/27/2022: The 2 small wounds proximal to the main wound have merged into a single site. The wounds look a little bit dry, but they are quite clean without significant slough accumulation. They remain quite tender. 09/03/2022: All of the wounds have now merged into 1 large wound. There is a strong odor coming from the wound and the surface is not particularly viable. It is extremely painful for her today. 09/10/2022: The wound is less black and purple this week but still does not look particularly viable. The surface is desiccated. The culture that I took last week returned with a polymicrobial  population including Pseudomonas. I prescribed both Augmentin  and levofloxacin. She has not yet picked up levofloxacin, as her pharmacy only notified her yesterday that it was ready. We have ordered a [employer] topical compounded antibiotic, but it has not yet come. 09/17/2022: The wound surface is markedly improved today. There is still an area of grayish-looking muscle but the rest appears significantly more viable. There is still a layer of slough on the surface. She still has a couple days left of oral antibiotic therapy. She has her Jodie Echevaria compounded topical antibiotic with her today. 09/24/2022: She has completed her oral antibiotic therapy. The wound surface is much cleaner today and more viable without any necrotic tissue. It is a bit desiccated, however. 10/01/2022: The moisture balance of the wound has improved. There is a layer of yellow slough on the surface, but beneath this, the wound is more pink. 10/08/2022: The wound is smaller and cleaner today with a layer of slough present. She is having less pain. 12/18; this patient has a new wound on the right lateral ankle which apparently started after a cat scratched her. Secondarily she managed to drop a cake pan on the same area. This is very painful. The original wound was on the left posterior calf we have been using [employer] under an [employer]. The Unna boot fell down and apparently she was in for a nurse visit perhaps last week and that 1 fell down as well This patient has central venous mediated hypertension. For member correctly she has a stent placed in the left common iliac artery by Dr. Randie Heinz some years ago [facility] Thurner syndrome] she also has had venous ablations and I believe a history of a DVT The patient has compression pumps at home but she does not use them 10/30/2022: The patient says that her wounds burned all week with the [facility] classic in place. She has a fair amount of slough and nonviable subcutaneous  tissue present on the right ankle. The left posterior calf wound is cleaner than I have seen it to date. Both remain fairly tender. 11/14/2022: Both wounds have substantial slough and eschar accumulation. They remain extremely tender. 11/21/2022: The wounds are cleaner this week, but still have slough and eschar accumulation. She continues to describe burning pain in both wounds, but upon further questioning, she actually has burning in both of her feet that radiates up her legs and includes to the wounds. 11/28/2022: Both wounds have slough and eschar accumulation, as well as adherent silver alginate that was difficult to remove. She continues to have pain out of proportion to the extent of her wounds. Her PCP did initiate Lyrica which she started taking last night. The dose is quite low, only 25 mg, but apparently there is a plan for upward titration, assuming she tolerates the medication. [name], [name] ([id number]) [id number] Page 8 of 11 12/05/2022: The wounds are both a little bit smaller today, but have significant slough accumulation, as usual. They remain exquisitely tender. She is now taking Lyrica twice a day. 12/19/2022: Both of her wounds look significantly better this week. There is slough buildup on both, but they are smaller. She seems to be getting a good response from Lyrica as she is having much less pain. 12/26/2022: The wound on her right lateral ankle is nearly closed. The wound on her left posterior calf is smaller but still has a lot of slough accumulation. They remain tender. 01/02/2023: The right lateral ankle wound is down to just a couple of millimeters. It is much less tender  than on prior occasions. There is minimal slough buildup. The wound on her posterior calf continues to contract, as well. It has thick slough buildup and remains fairly tender. 01/09/2023: The right lateral ankle wound is nearly closed. He remains tender. There is just a little  eschar on the surface. The wound on her posterior calf has improved quite a bit. There is still slough on the surface, but the more proximal area has epithelialized substantially and there is no longer any gray discoloration to her tissue. 3/13; patient presents for follow-up. Her right lateral ankle wound is almost healed. She has a wound to her left posterior calf with slough and granulation tissue. We have been using Santyl under Unna boots bilaterally to the lower extremities. She has no issues or complaints today. 01/30/2023: The right lateral ankle wound is down to just a pinhole under a layer of eschar. The left posterior calf wound is quite a bit improved since the last time I saw it. There is still a layer of slough on the surface but there has been more epithelialization. 02/07/2023: The right lateral ankle wound still has some eschar on the surface. The left posterior calf wound is about the same size, but with more areas of epithelialization. 02/20/2023: The right lateral ankle wound is healed. The left posterior calf wound is smaller and is essentially flush with the surrounding skin, but the surface is quite dry. Her leg wrap slipped and her calf is quite swollen above the level of the wound. 03/01/2023: The right lateral ankle wound reopened and she also suffered a fall that resulted in a small wound on her right anterior tibial surface. The left posterior calf wound has a much better moisture balance today. There is a layer of slough on the surface. The [employer] worked much better for her as far as compression this week. 03/06/2023: The right lateral ankle wound has a layer of eschar on the surface and is too painful for me to debride. The anterior tibial surface wound is more or less healed; it is a very superficial abrasion at this point. The left posterior calf wound has a layer of slough on the surface. Her edema control is better. 03/13/2023: The right lateral ankle wound has a layer of  eschar on the surface. Once this was debrided, it was found to be healed. The anterior tibial surface wound has a layer of stuck silver alginate on the surface. This was removed to reveal a very superficial abrasion but not much change from last week. The left posterior calf wound is smaller and has its usual layer of accumulated slough. 03/25/2023: The anterior tibial surface wound has eschar and silver alginate stuck to it. Once this was removed, the wound was found to be healed. The left posterior calf wound is roughly the same size and has a little bit thicker layer of accumulated slough. 04/03/2023: She has had more drainage from the posterior calf wound over the last week and the layer of slough is thicker. She also says it is more tender. 04/10/2023: Continued increase in drainage from her wound. The wound remains tender with a layer of slough on the surface. It does not appear as viable this week. She also has erythema, swelling, and warmth in her entire left lower leg extending into the midfoot. 04/17/2023: The wound is about the same size this week. There is fibrotic adherent slough, but the drainage is not as profuse. She is currently taking levofloxacin. She is complaining of an  increase in the burning sensation at her wound. 04/24/2023: The wound measured smaller this week and is much cleaner-looking. She completed her course of levofloxacin. Her PCP increased her dose of Lyrica and she reports that the burning has stopped. 05/01/2023: The wound is smaller again today and quite clean with only minimal slough on the surface. 05/08/2023: The wound is smaller again today. It is fairly clean, but the surface is a bit fibrotic. Moisture balance is better. Her leg is more swollen and erythematous today, however. 05/15/2023: The wound is about the same size today. The surface has very little slough accumulation, but remains fibrotic. Moisture balance is good. She is responding nicely to the oral Keflex  she is taking and her leg is less erythematous and edematous. 05/22/2023: The wound is unchanged in size. There is quite a bit of slough on the surface but the moisture balance is better. It is still fibrotic, but a little bit less so. Her leg is no longer erythematous, but she does have some persistent edema. 05/29/2023: The wound measured a little bit smaller today. It is dry again and the surface is quite fibrotic. The edema in her leg has improved. 06/12/2023: The wound measures slightly larger today. The moisture balance has improved markedly. There is soft slough on the wound surface. Edema control is improved. 06/19/2023: The wound measured slightly smaller today. There is a band of epithelium dividing the most proximal portion of the wound from the remainder. There is slough accumulation and moisture balance is improved. 06/26/2023: The band of epithelium between the 2 open areas has gotten wider. There is some slough accumulation on the surfaces along with a little bit of eschar. Moisture balance continues to be good. 07/03/2023: The band of epithelium continues to expand. The moisture balance remains good and the surface, particularly of the more distal aspect of the wound continues to improve. Slough and eschar accumulation, as usual. 07/10/2023: The wound measured smaller today. Moisture balance is good and the tissue underneath the layer of slough that has accumulated continues to improve. 07/17/2023: The wound is smaller again today. Moisture balance is appropriate. There is a little bit of slough overlying granulation tissue. 07/24/2023: The band of epithelium between the 2 open areas has gotten wider. The more proximal area has more epithelialization around the perimeter. Both have slough on the surface and remain fairly fibrotic. Moisture balance remains good. 08/07/2023: Both open areas are little bit smaller today. The band of epithelium between the 2 continues to enlarge. There is slough and  eschar on both surfaces. 08/14/2023: No significant change to the wound dimensions. The surface is rather dry and fibrotic again. 08/21/2023: The wound is smaller and the skin bridge between the 2 open areas has gotten wider. There is slough accumulation on the surfaces and the moisture balance is improved. TEKIRA, TABACCO ([id number]) [id number] Page 9 of 11 08/28/2023: Both open areas of the wound are smaller today. There is slough on the surface, but it is quite soft. Moisture balance is good. 09/04/2023: The band of epithelium between the 2 open areas continues to widen. The wound is very sensitive today, more so than usual. No concern for infection based upon visual inspection, however. There is some slough on the wound surface, which remains fairly fibrotic. 09/11/2023: Both open areas are little bit smaller today. There is slough accumulation on the surface. The underlying tissue is still somewhat fibrotic, but the color is good. Edema control is good. 09/17/2023: The wound continues to  improve. The open areas are smaller and the band of skin has gotten wider. The tissue color is possibly the best that I have ever seen it although it does remain somewhat fibrotic. There is slough on the wound surface. 09/25/2023: The wound is smaller again today. The slough on the surface is rubbery but the underlying tissue continues to improve, quality-wise. It is less fibrotic today. Edema control is good. 10/01/2023: The wound continues to contract. There is slough on the surface. The underlying tissue continues to be less fibrotic from week to week. Unfortunately, she has reopened an area on her right posterior lower leg. It extends to the fat layer and there is slough accumulation on the surface. This occurred despite Unna boot compression. 10/23/2023: The right leg wound has healed. The wound on her left leg has contracted further. The band of skin between the 2 open areas has  widened. There is some slough accumulation on the surface. Objective Constitutional Hypertensive, asymptomatic. no acute distress. Vitals Time Taken: 3:54 AM, Height: 63 in, Weight: 200 lbs, BMI: 35.4, Temperature: 97.7 F, Pulse: 77 bpm, Respiratory Rate: 18 breaths/min, Blood Pressure: 150/76 mmHg. Respiratory Normal work of breathing on room air.. General Notes: 10/23/2023: The right leg wound has healed. The wound on her left leg has contracted further. The band of skin between the 2 open areas has widened. There is some slough accumulation on the surface. Integumentary (Hair, Skin) Wound #2 status is Open. Original cause of wound was Blister. The date acquired was: 07/06/2022. The wound has been in treatment 64 weeks. The wound is located on the Left,Posterior Lower Leg. The wound measures 3.3cm length x 1.6cm width x 0.1cm depth; 4.147cm^2 area and 0.415cm^3 volume. There is Fat Layer (Subcutaneous Tissue) exposed. There is no tunneling or undermining noted. There is a medium amount of serosanguineous drainage noted. The wound margin is distinct with the outline attached to the wound base. There is large (67-100%) red granulation within the wound bed. There is a small (1-33%) amount of necrotic tissue within the wound bed including Adherent Slough. The periwound skin appearance had no abnormalities noted for texture. The periwound skin appearance had no abnormalities noted for moisture. The periwound skin appearance exhibited: Hemosiderin Staining. Periwound temperature was noted as No Abnormality. Wound #6 status is Healed - Epithelialized. Original cause of wound was Gradually Appeared. The date acquired was: 10/01/2023. The wound has been in treatment 3 weeks. The wound is located on the Right,Posterior Lower Leg. The wound measures 0cm length x 0cm width x 0cm depth; 0cm^2 area and 0cm^3 volume. There is no tunneling or undermining noted. There is a none present amount of drainage noted.  There is no granulation within the wound bed. There is no necrotic tissue within the wound bed. The periwound skin appearance had no abnormalities noted for texture. The periwound skin appearance had no abnormalities noted for moisture. The periwound skin appearance exhibited: Hemosiderin Staining. Periwound temperature was noted as No Abnormality. Assessment Active Problems ICD-10 Non-pressure chronic ulcer of other part of left lower leg with fat layer exposed Localized edema Varicose veins of bilateral lower extremities with other complications Venous insufficiency (chronic) (peripheral) Polyneuropathy, unspecified Procedures Wound #2 Pre-procedure diagnosis of Wound #2 is a Venous Leg Ulcer located on the Left,Posterior Lower Leg .Severity of Tissue Pre Debridement is: Fat layer exposed. There was a Excisional Skin/Subcutaneous Tissue Debridement with a total area of 3.32 sq cm performed by Duanne Guess, MD. With the following instrument(s): Curette to remove  Viable and Non-Viable tissue/material. Material removed includes Subcutaneous Tissue and Slough and after achieving pain control using Lidocaine 4% Topical Solution. No specimens were taken. A time out was conducted at 16:15, prior to the start of the procedure. A Minimum amount of bleeding was controlled with Pressure. The procedure was tolerated well with a pain level of 0 throughout and a pain level of 0 following the ZANAB, WETSEL ([id number]) [id number] Page 10 of 11 procedure. Post Debridement Measurements: 3.3cm length x 1.6cm width x 0.1cm depth; 0.415cm^3 volume. Character of Wound/Ulcer Post Debridement is improved. Severity of Tissue Post Debridement is: Fat layer exposed. Post procedure Diagnosis Wound #2: Same as Pre-Procedure Pre-procedure diagnosis of Wound #2 is a Venous Leg Ulcer located on the Left,Posterior Lower Leg . There was a Double Layer Compression Therapy Procedure by  Zenaida Deed, RN. Post procedure Diagnosis Wound #2: Same as Pre-Procedure Notes: urgo lite. Plan Follow-up Appointments: Return Appointment in 1 week. - Dr. Lady Gary 12/27 @ 10:00 am Anesthetic: Wound #2 Left,Posterior Lower Leg: (In clinic) Topical Lidocaine 4% applied to wound bed Bathing/ Shower/ Hygiene: May shower with protection but do not get wound dressing(s) wet. Protect dressing(s) with water repellant cover (for example, large plastic bag) or a cast cover and may then take shower. Additional Orders / Instructions: Other: - Recommend Nail Care Lymphedema Treatment Plan - Exercise, Compression and Elevation: Exercise daily as tolerated. (Walking, ROM, Calf Pumps and T T oe aps) Compression Garments, Class II. Wear daily when out of bed. May remove at bedtime - right leg Compression Wraps as ordered - left leg Elevate legs 30 - 60 minutes at or above heart level at least 3 - 4 times daily as able/tolerated Avoid standing for long periods and elevate leg(s) parallel to the floor when sitting Use Pneumatic Compression Device on leg(s) 2-3 times a day for 45-60 minutes WOUND #2: - Lower Leg Wound Laterality: Left, Posterior Cleanser: Soap and Water 1 x Per Week/30 Days Discharge Instructions: May shower and wash wound with dial antibacterial soap and water prior to dressing change. Peri-Wound Care: Ketoconazole Cream 2% 1 x Per Week/30 Days Discharge Instructions: Apply Ketoconazole as directed Peri-Wound Care: Triamcinolone 15 (g) 1 x Per Week/30 Days Discharge Instructions: Use triamcinolone 15 (g) as directed Peri-Wound Care: Zinc Oxide Ointment 30g tube 1 x Per Week/30 Days Discharge Instructions: Apply Zinc Oxide to periwound with each dressing change Peri-Wound Care: Sween Lotion (Moisturizing lotion) 1 x Per Week/30 Days Discharge Instructions: Apply moisturizing lotion as directed Topical: Gentamicin 1 x Per Week/30 Days Discharge Instructions: As directed by  physician Topical: Mupirocin Ointment 1 x Per Week/30 Days Discharge Instructions: Apply Mupirocin (Bactroban) as instructed Topical: Skintegrity Hydrogel 4 (oz) 1 x Per Week/30 Days Discharge Instructions: Apply hydrogel as directed Prim Dressing: Promogran Prisma Matrix, 4.34 (sq in) (silver collagen) 1 x Per Week/30 Days ary Discharge Instructions: Moisten collagen with saline or hydrogel Secondary Dressing: Optifoam Non-Adhesive Dressing, 4x4 in 1 x Per Week/30 Days Discharge Instructions: Apply over primary dressing as directed. Com pression Wrap: Urgo K2 Lite, (equivalent to a 3 layer) two layer compression system, regular 1 x Per Week/30 Days Discharge Instructions: Apply Urgo K2 Lite as directed (alternative to 3 layer compression). 10/23/2023: The right leg wound has healed. The wound on her left leg has contracted further. The band of skin between the 2 open areas has widened. There is some slough accumulation on the surface. I used a curette to debride slough and subcutaneous tissue from  the wound surfaces. We will continue the mixture of topical gentamicin and mupirocin for prophylaxis of infection, as well as suppression of normal skin flora. Continue Prisma silver collagen moistened with hydrogel and covered with Optifoam to retain moisture. Continue periwound ketoconazole, TCA, and zinc oxide. As we have no Coflex wraps in clinic today, we will use an Urgo lite compression wrap with a strip of standard Unna boot at the top and bottom to prevent slippage. She will follow-up in 1 week's time. The Electronic Signature(s) Signed: 10/23/2023 4:43:35 PM By: Duanne Guess MD FACS Entered By: Duanne Guess on 10/23/2023 16:38:16 -------------------------------------------------------------------------------- SuperBill Details Patient Name: Date of Service: [name] 10/23/2023 Medical Record Number: [id number] Patient Account Number: [id number] [name], [name] ([id number])  [id number] Page 11 of 11 Date of Birth/Sex: Treating RN: 29-Jun-1946 (77 y.o. F) Primary Care Provider: Eustaquio Boyden Other Clinician: Referring Provider: Treating Provider/Extender: Priscille Loveless in Treatment: 77 Diagnosis Coding ICD-10 Codes Code Description [phone] Non-pressure chronic ulcer of other part of left lower leg with fat layer exposed R60.0 Localized edema I83.893 Varicose veins of bilateral lower extremities with other complications I87.2 Venous insufficiency (chronic) (peripheral) G62.9 Polyneuropathy, unspecified Facility Procedures : CPT4 Code: [id number] Description: 11042 - DEB SUBQ TISSUE 20 SQ CM/< ICD-10 Diagnosis Description L97.822 Non-pressure chronic ulcer of other part of left lower leg with fat layer expo Modifier: sed Quantity: 1 Physician Procedures : CPT4 Code Description Modifier [id number] - WC PHYS LEVEL 4 - EST PT ICD-10 Diagnosis Description L97.822 Non-pressure chronic ulcer of other part of left lower leg with fat layer exposed R60.0 Localized edema I83.893 Varicose veins of bilateral  lower extremities with other complications I87.2 Venous insufficiency (chronic) (peripheral) Quantity: 1 : [id number] 11042 - WC PHYS SUBQ TISS 20 SQ CM ICD-10 Diagnosis Description L97.822 Non-pressure chronic ulcer of other part of left lower leg with fat layer exposed Quantity: 1 Electronic Signature(s) Signed: 10/23/2023 4:43:35 PM By: Duanne Guess MD FACS Entered By: Duanne Guess on 10/23/2023 16:42:05

## 2023-10-24 NOTE — Progress Notes (Signed)
TAIDE, TAFUR ([id number]) [id number] Page 1 of 9 Visit Report for 10/23/2023 Arrival Information Details Patient Name: Date of Service: Amber, [name] 10/23/2023 3:45 PM Medical Record Number: [id number] Patient Account Number: [id number] Date of Birth/Sex: Treating RN: [birth year] (77 y.o. F) Primary Care [name]: [name] Other Clinician: Referring [name]: Treating [name]/Extender: [name] in Treatment: 62 Visit Information History Since Last Visit Added or deleted any medications: No Patient Arrived: Ambulatory Any new allergies or adverse reactions: No Arrival Time: 15:54 Had a fall or experienced change in No Accompanied By: self activities of daily living that may affect Transfer Assistance: None risk of falls: Patient Identification Verified: Yes Signs or symptoms of abuse/neglect since last visito No Secondary Verification Process Completed: Yes Hospitalized since last visit: No Patient Requires Transmission-Based Precautions: No Implantable device outside of the clinic excluding No Patient Has Alerts: No cellular tissue based products placed in the center since last visit: Has Dressing in Place as Prescribed: Yes Has Compression in Place as Prescribed: Yes Pain Present Now: No Electronic Signature(s) Signed: 10/23/2023 5:22:11 PM By: Amber Deed RN, [name] Entered By: [name] on 10/23/2023 16:05:53 -------------------------------------------------------------------------------- Compression Therapy Details Patient Name: Date of Service: [name] 10/23/2023 3:45 PM Medical Record Number: [id number] Patient Account Number: [id number] Date of Birth/Sex: Treating RN: May 29, 1946 (77 y.o. [name] Primary Care Jeovany [name]: [name] Other Clinician: Referring Anberlin Diez: Treating [name]/Extender: [name] in Treatment: 40 Compression  Therapy Performed for Wound Assessment: Wound #2 Left,Posterior Lower Leg Performed By: Clinician Amber Deed, RN Compression Type: Double Layer Post Procedure Diagnosis Same as Pre-procedure Notes urgo lite Electronic Signature(s) Signed: 10/23/2023 5:22:11 PM By: Amber Deed RN, [name] Entered By: [name] on 10/23/2023 16:18:57 [name] ([id number]) [id number] Page 2 of 9 -------------------------------------------------------------------------------- Encounter Discharge Information Details Patient Name: Date of Service: Amber, [name] 10/23/2023 3:45 PM Medical Record Number: [id number] Patient Account Number: [id number] Date of Birth/Sex: Treating RN: 09/19/1946 (77 y.o. [name] Primary Care Shadrach Bartunek: [name] Other Clinician: Referring Jaslyne Beeck: Treating [name]/Extender: [name] in Treatment: 103 Encounter Discharge Information Items Post Procedure Vitals Discharge Condition: Stable Temperature (F): 97.7 Ambulatory Status: Ambulatory Pulse (bpm): 77 Discharge Destination: Home Respiratory Rate (breaths/min): 18 Transportation: Private Auto Blood Pressure (mmHg): 150/76 Accompanied By: self Schedule Follow-up Appointment: Yes Clinical Summary of Care: Patient Declined Electronic Signature(s) Signed: 10/23/2023 5:22:11 PM By: Amber Deed RN, [name] Entered By: [name] on 10/23/2023 16:41:48 -------------------------------------------------------------------------------- Lower Extremity Assessment Details Patient Name: Date of Service: Amber, [name] 10/23/2023 3:45 PM Medical Record Number: [id number] Patient Account Number: [id number] Date of Birth/Sex: Treating RN: Mar 20, 1946 (77 y.o. [name] Primary Care [name]: [name] Other Clinician: Referring Arlana Canizales: Treating [name]/Extender: [name] in  Treatment: 64 Edema Assessment Assessed: [Left: No] [Right: No] Edema: [Left: Yes] [Right: Yes] Calf Left: Right: Point of Measurement: 31 cm From Medial Instep 50 cm 45.5 cm Ankle Left: Right: Point of Measurement: 9 cm From Medial Instep 23.7 cm 23.5 cm Vascular Assessment Pulses: Dorsalis Pedis Palpable: [Left:Yes] [Right:Yes] Extremity colors, hair growth, and conditions: Extremity Color: [Left:Normal] [Right:Hyperpigmented] Hair Growth on Extremity: [Left:Yes] [Right:Yes] Temperature of Extremity: [Left:Warm] [Right:Warm] Capillary Refill: [Left:< 3 seconds] [Right:< 3 seconds] Dependent Rubor: [Left:No No] Electronic Signature(s) [name] ([id number]) [id number] Page 3 of 9 Signed: 10/23/2023 5:22:11 PM By: Amber Deed RN, [name] Entered By: [name] on 10/23/2023 16:10:24 -------------------------------------------------------------------------------- Multi Wound Chart Details Patient Name:  Date of Service: Amber, [name] 10/23/2023 3:45 PM Medical Record Number: [id number] Patient Account Number: [id number] Date of Birth/Sex: Treating RN: Jul 21, 1946 (77 y.o. F) Primary Care [name]: [name] Other Clinician: Referring [name]: Treating [name]/Extender: [name] in Treatment: 64 Vital Signs Height(in): 63 Pulse(bpm): 77 Weight(lbs): 200 Blood Pressure(mmHg): 150/76 Body Mass Index(BMI): 35.4 Temperature(F): 97.7 Respiratory Rate(breaths/min): 18 [2:Photos:] [N/A:N/A] Left, Posterior Lower Leg Right, Posterior Lower Leg N/A Wound Location: Blister Gradually Appeared N/A Wounding Event: Venous Leg Ulcer Lymphedema N/A Primary Etiology: Cataracts, Asthma, Hypertension, Cataracts, Asthma, Hypertension, N/A Comorbid History: Neuropathy, Received Chemotherapy, Neuropathy, Received Chemotherapy, Received Radiation Received Radiation 07/06/2022 10/01/2023 N/A Date Acquired: 64 3  N/A Weeks of Treatment: Open Healed - Epithelialized N/A Wound Status: No No N/A Wound Recurrence: Yes No N/A Clustered Wound: 2 N/A N/A Clustered Quantity: 3.3x1.6x0.1 0x0x0 N/A Measurements L x W x D (cm) 4.147 0 N/A A (cm) : rea 0.415 0 N/A Volume (cm) : 39.70% 100.00% N/A % Reduction in A rea: 39.60% 100.00% N/A % Reduction in Volume: Full Thickness Without Exposed Full Thickness Without Exposed N/A Classification: Support Structures Support Structures Medium None Present N/A Exudate A mount: Serosanguineous N/A N/A Exudate Type: red, brown N/A N/A Exudate Color: Distinct, outline attached N/A N/A Wound Margin: Large (67-100%) None Present (0%) N/A Granulation A mount: Red N/A N/A Granulation Quality: Small (1-33%) None Present (0%) N/A Necrotic A mount: Fat Layer (Subcutaneous Tissue): Yes Fascia: No N/A Exposed Structures: Fascia: No Fat Layer (Subcutaneous Tissue): No Tendon: No Tendon: No Muscle: No Muscle: No Joint: No Joint: No Bone: No Bone: No Small (1-33%) Large (67-100%) N/A Epithelialization: Debridement - Excisional N/A N/A Debridement: Pre-procedure Verification/Time Out 16:15 N/A N/A Taken: Lidocaine 4% Topical Solution N/A N/A Pain Control: Subcutaneous, Slough N/A N/A Tissue Debrided: Skin/Subcutaneous Tissue N/A N/A Level: 3.32 N/A N/A Debridement A (sq cm): rea Curette N/A N/A Instrument: Minimum N/A N/A Bleeding: CARY, VANDERVORT ([id number]) [id number] Page 4 of 9 Pressure N/A N/A Hemostasis A chieved: 0 N/A N/A Procedural Pain: 0 N/A N/A Post Procedural Pain: Procedure was tolerated well N/A N/A Debridement Treatment Response: 3.3x1.6x0.1 N/A N/A Post Debridement Measurements L x W x D (cm) 0.415 N/A N/A Post Debridement Volume: (cm) No Abnormalities Noted No Abnormalities Noted N/A Periwound Skin Texture: No Abnormalities Noted No Abnormalities Noted N/A Periwound Skin  Moisture: Hemosiderin Staining: Yes Hemosiderin Staining: Yes N/A Periwound Skin Color: No Abnormality No Abnormality N/A Temperature: Compression Therapy N/A N/A Procedures Performed: Debridement Treatment Notes Electronic Signature(s) Signed: 10/23/2023 4:43:35 PM By: Duanne Guess MD FACS Entered By: Duanne Guess on 10/23/2023 16:32:33 -------------------------------------------------------------------------------- Multi-Disciplinary Care Plan Details Patient Name: Date of Service: Amber, [name] 10/23/2023 3:45 PM Medical Record Number: [id number] Patient Account Number: [id number] Date of Birth/Sex: Treating RN: 01-29-1946 (77 y.o. [name] Primary Care Cailin [name]: [name] Other Clinician: Referring Kenyia Wambolt: Treating Brison Fiumara/Extender: [name] in Treatment: 83 Multidisciplinary Care Plan reviewed with physician Active Inactive Venous Leg Ulcer Nursing Diagnoses: Knowledge deficit related to disease process and management Potential for venous Insuffiency (use before diagnosis confirmed) Goals: Patient will maintain optimal edema control Date Initiated: 02/07/2023 Target Resolution Date: 11/20/2023 Goal Status: Active Interventions: Assess peripheral edema status every visit. Compression as ordered Treatment Activities: Therapeutic compression applied : 02/07/2023 Notes: Electronic Signature(s) Signed: 10/23/2023 5:22:11 PM By: Amber Deed RN, [name] Entered By: [name] on 10/23/2023 16:14:59 [name] ([id number]) [id number] Page 5 of 9 -------------------------------------------------------------------------------- Pain Assessment Details Patient Name:  Date of Service: Amber, [name] 10/23/2023 3:45 PM Medical Record Number: [id number] Patient Account Number: [id number] Date of Birth/Sex: Treating RN: 04/18/1946 (77 y.o. F) Primary Care Cristofher Livecchi: [name]  Other Clinician: Referring Maxx Calaway: Treating Angel Hobdy/Extender: [name] in Treatment: 64 Active Problems Location of Pain Severity and Description of Pain Patient Has Paino No Site Locations Rate the pain. Current Pain Level: 0 Pain Management and Medication Current Pain Management: Electronic Signature(s) Signed: 10/23/2023 5:22:11 PM By: Amber Deed RN, [name] Entered By: [name] on 10/23/2023 16:06:56 -------------------------------------------------------------------------------- Patient/Caregiver Education Details Patient Name: Date of Service: Amber, [name] 12/18/2024andnbsp3:45 PM Medical Record Number: [id number] Patient Account Number: [id number] Date of Birth/Gender: Treating RN: December 18, 1945 (77 y.o. [name] Primary Care Physician: [name] Other Clinician: Referring Physician: Treating Physician/Extender: [name] in Treatment: 2 Education Assessment Education Provided To: Patient Education Topics Provided Venous: Methods: Explain/Verbal Responses: Reinforcements needed, State content correctly Electronic Signature(s) Signed: 10/23/2023 5:22:11 PM By: Amber Deed RN, [name] Entered By: [name] on 10/23/2023 16:15:17 [name] ([id number]) [id number] Page 6 of 9 -------------------------------------------------------------------------------- Wound Assessment Details Patient Name: Date of Service: Amber, [name] 10/23/2023 3:45 PM Medical Record Number: [id number] Patient Account Number: [id number] Date of Birth/Sex: Treating RN: [birth year] (77 y.o. Billy Coast, Shannah Primary Care Morton Simson: [name] Other Clinician: Referring Desten Manor: Treating Yoana Staib/Extender: Katharina Caper Weeks in Treatment: 64 Wound Status Wound Number: 2 Primary Venous Leg Ulcer Etiology: Wound Location: Left,  Posterior Lower Leg Wound Open Wounding Event: Blister Status: Date Acquired: 07/06/2022 Comorbid Cataracts, Asthma, Hypertension, Neuropathy, Received Weeks Of Treatment: 64 History: Chemotherapy, Received Radiation Clustered Wound: Yes Photos Wound Measurements Length: (cm) Width: (cm) Depth: (cm) Clustered Quantity: Area: (cm) Volume: (cm) 3.3 % Reduction in Area: 39.7% 1.6 % Reduction in Volume: 39.6% 0.1 Epithelialization: Small (1-33%) 2 Tunneling: No 4.147 Undermining: No 0.415 Wound Description Classification: Full Thickness Without Exposed Sup Wound Margin: Distinct, outline attached Exudate Amount: Medium Exudate Type: Serosanguineous Exudate Color: red, brown port Structures Foul Odor After Cleansing: No Slough/Fibrino Yes Wound Bed Granulation Amount: Large (67-100%) Exposed Structure Granulation Quality: Red Fascia Exposed: No Necrotic Amount: Small (1-33%) Fat Layer (Subcutaneous Tissue) Exposed: Yes Necrotic Quality: Adherent Slough Tendon Exposed: No Muscle Exposed: No Joint Exposed: No Bone Exposed: No Periwound Skin Texture Texture Color No Abnormalities Noted: Yes No Abnormalities Noted: No Hemosiderin Staining: Yes Moisture No Abnormalities Noted: Yes Temperature / Pain Temperature: No Abnormality Treatment Notes Wound #2 (Lower Leg) Wound Laterality: Left, Posterior COUTURE, PFAHL ([id number]) [id number] Page 7 of 9 Cleanser Soap and Water Discharge Instruction: May shower and wash wound with dial antibacterial soap and water prior to dressing change. Peri-Wound Care Ketoconazole Cream 2% Discharge Instruction: Apply Ketoconazole as directed Triamcinolone 15 (g) Discharge Instruction: Use triamcinolone 15 (g) as directed Zinc Oxide Ointment 30g tube Discharge Instruction: Apply Zinc Oxide to periwound with each dressing change Sween Lotion (Moisturizing lotion) Discharge Instruction: Apply moisturizing  lotion as directed Topical Gentamicin Discharge Instruction: As directed by physician Mupirocin Ointment Discharge Instruction: Apply Mupirocin (Bactroban) as instructed Skintegrity Hydrogel 4 (oz) Discharge Instruction: Apply hydrogel as directed Primary Dressing Promogran Prisma Matrix, 4.34 (sq in) (silver collagen) Discharge Instruction: Moisten collagen with saline or hydrogel Secondary Dressing Optifoam Non-Adhesive Dressing, 4x4 in Discharge Instruction: Apply over primary dressing as directed. Secured With Compression Wrap Urgo K2 Lite, (equivalent to a 3 layer) two layer compression system, regular Discharge Instruction: Apply Urgo K2 Lite as directed (alternative  to 3 layer compression). Compression Stockings Add-Ons Electronic Signature(s) Signed: 10/23/2023 5:22:11 PM By: Amber Deed RN, [name] Entered By: [name] on 10/23/2023 16:12:23 -------------------------------------------------------------------------------- Wound Assessment Details Patient Name: Date of Service: Amber, [name] 10/23/2023 3:45 PM Medical Record Number: [id number] Patient Account Number: [id number] Date of Birth/Sex: Treating RN: October 29, 1946 (77 y.o. [name] Primary Care Jsoeph Podesta: [name] Other Clinician: Referring Kalid Ghan: Treating Phil Corti/Extender: Katharina Caper Weeks in Treatment: 64 Wound Status Wound Number: 6 Primary Lymphedema Etiology: Wound Location: Right, Posterior Lower Leg Wound Healed - Epithelialized Wounding Event: Gradually Appeared Status: Date Acquired: 10/01/2023 Comorbid Cataracts, Asthma, Hypertension, Neuropathy, Received Weeks Of Treatment: 3 History: Chemotherapy, Received Radiation Clustered Wound: No Photos KERRIGAN, RUPRIGHT ([id number]) [id number] Page 8 of 9 Wound Measurements Length: (cm) Width: (cm) Depth: (cm) Area: (cm) Volume: (cm) 0 % Reduction in Area: 100% 0 %  Reduction in Volume: 100% 0 Epithelialization: Large (67-100%) 0 Tunneling: No 0 Undermining: No Wound Description Classification: Full Thickness Without Exposed Support Exudate Amount: None Present Structures Wound Bed Granulation Amount: None Present (0%) Exposed Structure Necrotic Amount: None Present (0%) Fascia Exposed: No Fat Layer (Subcutaneous Tissue) Exposed: No Tendon Exposed: No Muscle Exposed: No Joint Exposed: No Bone Exposed: No Periwound Skin Texture Texture Color No Abnormalities Noted: Yes No Abnormalities Noted: No Hemosiderin Staining: Yes Moisture No Abnormalities Noted: Yes Temperature / Pain Temperature: No Abnormality Electronic Signature(s) Signed: 10/23/2023 5:22:11 PM By: Amber Deed RN, [name] Entered By: [name] on 10/23/2023 16:11:11 -------------------------------------------------------------------------------- Vitals Details Patient Name: Date of Service: [name] 10/23/2023 3:45 PM Medical Record Number: [id number] Patient Account Number: [id number] Date of Birth/Sex: Treating RN: 04-15-1946 (77 y.o. F) Primary Care Stephan Draughn: [name] Other Clinician: Referring [name] [name]: Treating Kenyatta Gloeckner/Extender: [name] in Treatment: 64 Vital Signs Time Taken: 03:54 Temperature (F): 97.7 Height (in): 63 Pulse (bpm): 77 Weight (lbs): 200 Respiratory Rate (breaths/min): 18 Body Mass Index (BMI): 35.4 Blood Pressure (mmHg): 150/76 Reference Range: 80 - 120 mg / dl Electronic Signature(s) Signed: 10/23/2023 5:22:11 PM By: Amber Deed RN, [name] Entered By: [name] on 10/23/2023 16:06:47 [name] ([id number]) [id number] Page 9 of 9

## 2023-11-01 ENCOUNTER — Ambulatory Visit (HOSPITAL_BASED_OUTPATIENT_CLINIC_OR_DEPARTMENT_OTHER): Payer: Medicare Other | Admitting: General Surgery

## 2023-11-04 ENCOUNTER — Ambulatory Visit (INDEPENDENT_AMBULATORY_CARE_PROVIDER_SITE_OTHER): Payer: Medicare Other

## 2023-11-04 VITALS — Ht 60.0 in | Wt 203.0 lb

## 2023-11-04 DIAGNOSIS — Z0000 Encounter for general adult medical examination without abnormal findings: Secondary | ICD-10-CM

## 2023-11-04 NOTE — Patient Instructions (Signed)
Ms. Joye , Thank you for taking time to come for your Medicare Wellness Visit. I appreciate your ongoing commitment to your health goals. Please review the following plan we discussed and let me know if I can assist you in the future.   Referrals/Orders/Follow-Ups/Clinician Recommendations: none  This is a list of the screening recommended for you and due dates:  Health Maintenance  Topic Date Due   Zoster (Shingles) Vaccine (1 of 2) Never done   Mammogram  03/21/2023   COVID-19 Vaccine (7 - 2024-25 season) 11/18/2023   Medicare Annual Wellness Visit  11/03/2024   DTaP/Tdap/Td vaccine (3 - Td or Tdap) 01/27/2032   Pneumonia Vaccine  Completed   Flu Shot  Completed   DEXA scan (bone density measurement)  Completed   Hepatitis C Screening  Completed   HPV Vaccine  Aged Out   Colon Cancer Screening  Discontinued    Advanced directives: (Declined) Advance directive discussed with you today. Even though you declined this today, please call our office should you change your mind, and we can give you the proper paperwork for you to fill out.  Next Medicare Annual Wellness Visit scheduled for next year: Yes

## 2023-11-04 NOTE — Progress Notes (Signed)
Subjective:   [name] is a 77 y.o. female who presents for Medicare Annual (Subsequent) preventive examination.  Visit Complete: Virtual I connected with  [name] on 11/04/23 by a audio enabled telemedicine application and verified that I am speaking with the correct person using two identifiers.  Patient Location: Home  Provider Location: Office/Clinic  I discussed the limitations of evaluation and management by telemedicine. The patient expressed understanding and agreed to proceed.  Vital Signs: Because this visit was a virtual/telehealth visit, some criteria may be missing or patient reported. Any vitals not documented were not able to be obtained and vitals that have been documented are patient reported.  Patient Medicare AWV questionnaire was completed by the patient on (not done); I have confirmed that all information answered by patient is correct and no changes since this date.  Cardiac Risk Factors include: advanced age (>17men, >10 women);hypertension;obesity (BMI >30kg/m2)    Objective:    Today's Vitals   11/04/23 1410  Weight: 203 lb (92.1 kg)  Height: 5' (1.524 m)   Body mass index is 39.65 kg/m.     11/04/2023    2:25 PM 08/16/2023   11:59 AM 10/19/2022   12:08 PM 08/15/2022    1:27 PM 01/26/2022    6:10 PM 01/26/2022   12:34 PM 10/13/2021   11:26 AM  Advanced Directives  Does Patient Have a Medical Advance Directive? No No No No  No No  Would patient like information on creating a medical advance directive?  No - Patient declined No - Patient declined No - Patient declined No - Patient declined  Yes (MAU/Ambulatory/Procedural Areas - Information given)    Current Medications (verified) Outpatient Encounter Medications as of 11/04/2023  Medication Sig   acetaminophen (TYLENOL) 650 MG CR tablet Take 2 tablets (1,300 mg total) by mouth 2 (two) times daily as needed for pain.   albuterol (VENTOLIN HFA) 108 (90 Base) MCG/ACT inhaler INHALE 1-2  PUFFS BY MOUTH EVERY 6 HOURS AS NEEDED FOR WHEEZE OR SHORTNESS OF BREATH   aspirin EC 81 MG tablet Take 81 mg by mouth at bedtime. Melanoma prevention   furosemide (LASIX) 40 MG tablet Take 1 tablet (40 mg total) by mouth daily. Take second dose if needed for leg swelling/weight gain   loratadine (CLARITIN) 10 MG tablet Take 10 mg by mouth daily.   losartan (COZAAR) 50 MG tablet Take 1 tablet (50 mg total) by mouth daily.   montelukast (SINGULAIR) 10 MG tablet Take 1 tablet (10 mg total) by mouth daily.   Multiple Vitamin (MULTIVITAMIN WITH MINERALS) TABS tablet Take 1 tablet by mouth daily.   phenol (CHLORASEPTIC GARGLE) 1.4 % LIQD Use as directed 1 spray in the mouth or throat as needed for throat irritation / pain.   Polyvinyl Alcohol-Povidone (TEARS PLUS OP) Place 1 drop into both eyes daily as needed (dry eyes/ redness/ burning).    potassium chloride SA (KLOR-CON M20) 20 MEQ tablet Take 1 tablet (20 mEq total) by mouth 2 (two) times daily.   pregabalin (LYRICA) 25 MG capsule TAKE 1 CAPSULE (25 MG TOTAL) BY MOUTH IN THE MORNING AND 2 CAPSULES (50 MG TOTAL) AT BEDTIME.   PRESCRIPTION MEDICATION CPAP   saccharomyces boulardii (FLORASTOR) 250 MG capsule Take 1 capsule (250 mg total) by mouth 2 (two) times daily.   triamcinolone (NASACORT) 55 MCG/ACT AERO nasal inhaler Place 2 sprays into the nose daily. In each nostril   Vitamin D, Ergocalciferol, (DRISDOL) 1.25 MG (50000 UNIT)  CAPS capsule TAKE 1 CAPSULE BY MOUTH ONCE A WEEK   No facility-administered encounter medications on file as of 11/04/2023.    Allergies (verified) Sulfa antibiotics, Voltaren [diclofenac sodium], Latex, Tape, Doxycycline, Gabapentin, Other, and Wound dressing adhesive   History: Past Medical History:  Diagnosis Date   Allergy    Anesthesia complication    trouble waking up 2/2 CPAP   Arthritis    Asthma in adult    Cataract 2019   left eye resolved with surgery   Cervical spondylosis 2012   [place] [facility] [place])    Choroidal nevus of left eye 03/22/2016   Chronic venous insufficiency 2016   severe reflux with painful varicosities sees [facility]   Clotting disorder (HCC)    due to vein ablation    Cognitive dysfunction    Complication of anesthesia    difficulty coming out due to sleep apnea per pt   Difficult intubation    PATIENT DENIES    Diverticulosis    per ct scans 09-2016 and 01-2017   DVT (deep venous thrombosis) (HCC) 2011   small, developed after venous ablation   DVT (deep venous thrombosis) (HCC)    Family history of adverse reaction to anesthesia    O2 levels drop upon waking    Hearing loss sensory, bilateral 2013   mod-severe high freq sensorineural ([facility])   Hiatal hernia     09-2016 ct scan HP at cancer center    History of kidney stones 1980s   History of radiation therapy 07/19/11-08/25/2011   RIGHT AXILLARY REGION/METASTATIC   Hypertension    Melanoma (HCC) 06/29/2010   MALIGNANT MELANOMA R SHOULDER/SUPRASCAPULAR BACK s/p interferon chemo and XRT   Neuromuscular disorder (HCC)    muscle spasms   OSA (obstructive sleep apnea)    on CPAP   Pneumonia    2001   RLS (restless legs syndrome)    Seasonal allergies    Sleep apnea    wears cpap   Tinnitus    Unspecified vitamin D deficiency 03/18/2013   Varicose veins of left lower extremity    Past Surgical History:  Procedure Laterality Date   ABI  2016   WNL   ANTERIOR CERVICAL DECOMP/DISCECTOMY FUSION N/A 08/22/2015   ANTERIOR CERVICAL DECOMPRESSION FUSION C3/4 - interbody graft and anterior plate; Barnett Abu, MD   ANTERIOR CERVICAL DECOMP/DISCECTOMY FUSION N/A 07/02/2016   Procedure: Cervical four- five Cervical five- six Cervical six- seven Anterior cervical decompression/diskectomy/fusion;  Surgeon: Barnett Abu, MD;  Location: [facility];  Service: Neurosurgery;  Laterality: N/A;  C4-5 C5-6 C6-7 Anterior cervical decompression/diskectomy/fusion   BREAST BIOPSY Left 2013   BENIGN   BREAST SURGERY  Right 1990   BX    CARDIOVASCULAR STRESS TEST  2010   normal stress test, EF 66%   CATARACT EXTRACTION W/ INTRAOCULAR LENS IMPLANT Left 2019   Dr. Dagoberto Ligas   COLONOSCOPY  03/2005   benign polyp, int hem, rpt 5 yrs ([place], [place])   COLONOSCOPY WITH PROPOFOL N/A 01/06/2018   diverticulosis, decreased sphincter tone, no f/u needed Sherrilyn Rist, MD)   CORONARY ULTRASOUND/IVUS N/A 08/03/2019   Maeola Harman, MD   DEEP AXILLARY SENTINEL NODE BIOPSY / EXCISION  2012   RIGHT   DEXA  06/2016   WNL   DILATION AND CURETTAGE OF UTERUS  2010   ENDOVENOUS ABLATION SAPHENOUS VEIN W/ LASER Left 2010   [facility]   ENDOVENOUS ABLATION SAPHENOUS VEIN W/ LASER Left 2016   accessory branch  [facility] [place])   ESOPHAGOGASTRODUODENOSCOPY  2006   mod chronic carditis, no H pylori ([place], Sharol Given)   KNEE SURGERY  1985   left   LOWER EXTREMITY VENOGRAPHY Left 08/03/2019   Maeola Harman, MD   LUMBAR LAMINECTOMY  2001   L4/5   MELANOMA EXCISION  2011   Right shoulder, with sentinel lymph node biopsy   PERIPHERAL VASCULAR INTERVENTION Left 08/03/2019   Stent of left common iliac vein with 14 x 60 mm Vici Randie Heinz, Dennard Schaumann, MD)   [facility] REMOVAL  12/05/2011   Procedure: REMOVAL PORT-A-CATH;  Surgeon: Emelia Loron, MD;  Location: [facility];  Service: General;  Laterality: N/A;  left port removal   PORTACATH PLACEMENT     left subclavian   UPPER GASTROINTESTINAL ENDOSCOPY     Family History  Problem Relation Age of Onset   Cancer Mother 4       uterine   CAD Father 76       MI   Hypertension Father 52   Alzheimer's disease Father 75   Heart attack Father    Diabetes Sister    Cancer Paternal Grandmother        melanoma, possibly   Cancer Cousin        x2, breast   Colon cancer Neg Hx    Colon polyps Neg Hx    Rectal cancer Neg Hx    Stomach cancer Neg Hx    Social History   Socioeconomic History   Marital status: Divorced     Spouse name: Not on file   Number of children: 0   Years of education: Not on file   Highest education level: Some college, no degree  Occupational History   Occupation: retired    Associate Professor: RETIRED    Comment: Emergency planning/management officer   Tobacco Use   Smoking status: Former    Current packs/day: 0.00    Types: Cigarettes    Quit date: 11/05/1965    Years since quitting: 58.0    Passive exposure: Never   Smokeless tobacco: Never   Tobacco comments:    socially as a teen  Vaping Use   Vaping status: Former  Substance and Sexual Activity   Alcohol use: No    Alcohol/week: 0.0 standard drinks of alcohol   Drug use: No   Sexual activity: Never  Other Topics Concern   Not on file  Social History Narrative   L handed   01/04/22 Lives alone in RV.  Sister and husband live next door   Occupation: retired, prior worked as Emergency planning/management officer at [employer]   Edu: some college   Diet: poor - good water, rare fruits/vegetables   Social Drivers of Corporate investment banker Strain: Low Risk  (11/04/2023)   Overall Financial Resource Strain (CARDIA)    Difficulty of Paying Living Expenses: Not very hard  Food Insecurity: No Food Insecurity (11/04/2023)   Hunger Vital Sign    Worried About Running Out of Food in the Last Year: Never true    Ran Out of Food in the Last Year: Never true  Transportation Needs: No Transportation Needs (11/04/2023)   PRAPARE - Administrator, Civil Service (Medical): No    Lack of Transportation (Non-Medical): No  Physical Activity: Sufficiently Active (11/04/2023)   Exercise Vital Sign    Days of Exercise per Week: 7 days    Minutes of Exercise per Session: 30 min  Stress: No Stress Concern Present (11/04/2023)  [employer] of Occupational Health - Occupational Stress Questionnaire    Feeling of Stress : Only a little  Social Connections: Socially Isolated (11/04/2023)   Social Connection and Isolation Panel [NHANES]    Frequency of Communication  with Friends and Family: Three times a week    Frequency of Social Gatherings with Friends and Family: Three times a week    Attends Religious Services: Never    Active Member of Clubs or Organizations: No    Attends Banker Meetings: Never    Marital Status: Divorced    Tobacco Counseling Counseling given: Not Answered Tobacco comments: socially as a teen   Clinical Intake:  Pre-visit preparation completed: No  Pain : No/denies pain   BMI - recorded: 39.65 Nutritional Status: BMI > 30  Obese Nutritional Risks: Non-healing wound (venous ulcer on left leg) Diabetes: No  How often do you need to have someone help you when you read instructions, pamphlets, or other written materials from your doctor or pharmacy?: 1 - Never  Interpreter Needed?: No  Comments: lives alone Information entered by :: B.Ryker Sudbury,LPN   Activities of Daily Living    11/04/2023    2:25 PM  In your present state of health, do you have any difficulty performing the following activities:  Hearing? 0  Vision? 1  Difficulty concentrating or making decisions? 0  Walking or climbing stairs? 1  Comment occassionally due to asthma  Dressing or bathing? 0  Doing errands, shopping? 0  Preparing Food and eating ? N  Using the Toilet? N  In the past six months, have you accidently leaked urine? N  Do you have problems with loss of bowel control? Y  Comment diarrhea bouts for over a year  Managing your Medications? N  Managing your Finances? N  Housekeeping or managing your Housekeeping? N    Patient Care Team: Eustaquio Boyden, MD as PCP - General (Family Medicine) Myna Hidalgo Rose Phi, MD as Consulting Physician (Oncology) Barnett Abu, MD as Consulting Physician (Neurosurgery) Arminda Resides, MD as Consulting Physician (Dermatology) Elinor Parkinson, [place] as Consulting Physician (Podiatry) Lupita Leash, MD as Consulting Physician (Pulmonary Disease) Mckinley Jewel, MD as Consulting  Physician (Ophthalmology)  Indicate any recent Medical Services you may have received from other than [facility] providers in the past year (date may be approximate).     Assessment:   This is a routine wellness examination for [place].  Hearing/Vision screen Hearing Screening - Comments:: Pt says she hears fine Vision Screening - Comments:: Pt says her vision; retina problems:blurry vision and light issues [facility] [place]   Goals Addressed             This Visit's Progress    DIET - EAT MORE FRUITS AND VEGETABLES   Not on track    Will continue to try to work on     COMPLETED: DIET - INCREASE WATER INTAKE   On track    Starting 10/01/2018, I will continue to drink at least 5-8 glasses of water daily.      COMPLETED: Patient Stated   On track    10/08/2019, I will maintain and continue medications as prescribed.      COMPLETED: Patient Stated   On track    10/11/2020, I will maintain and continue medications as prescribed.      Patient Stated   Not on track    11/04/23-Would like to continue to loose weight and stay active. Would like  to remain independent        Depression Screen    11/04/2023    2:20 PM 09/16/2023   11:50 AM 03/11/2023    4:08 PM 11/16/2022   12:15 PM 10/19/2022   12:06 PM 04/20/2022    2:33 PM 10/13/2021   11:32 AM  PHQ 2/9 Scores  PHQ - 2 Score 0 0 0 0 0 0 0  PHQ- 9 Score  6   0      Fall Risk    11/04/2023    2:16 PM 09/16/2023   11:50 AM 03/11/2023    4:07 PM 11/16/2022   12:15 PM 10/19/2022   12:09 PM  Fall Risk   Falls in the past year? 1 1 1  0 0  Number falls in past yr: 0 0 1  0  Injury with Fall? 0 0 1  0  Risk for fall due to : No Fall Risks    No Fall Risks  Follow up Education provided;Falls prevention discussed  Falls evaluation completed;Education provided;Falls prevention discussed  Falls prevention discussed;Falls evaluation completed    MEDICARE RISK AT HOME: Medicare Risk at Home Any  stairs in or around the home?: Yes If so, are there any without handrails?: Yes Home free of loose throw rugs in walkways, pet beds, electrical cords, etc?: Yes Adequate lighting in your home to reduce risk of falls?: Yes Life alert?: No Use of a cane, walker or w/c?: Yes Grab bars in the bathroom?: No Shower chair or bench in shower?: No Elevated toilet seat or a handicapped toilet?: Yes  TIMED UP AND GO:  Was the test performed?  No    Cognitive Function:    10/11/2020   11:30 AM 10/08/2019    3:37 PM 10/01/2018   10:41 AM 09/25/2017   11:11 AM 09/24/2016   11:42 AM  MMSE - Mini Mental State Exam  Orientation to time 5 5 5 5 5   Orientation to Place 5 5 5 5 5   Registration 3 3 3 3 3   Attention/ Calculation 5 5 0 0 0  Recall 3 3 3 3 3   Language- name 2 objects   0 0 0  Language- repeat 1 1 1 1 1   Language- follow 3 step command   3 3 3   Language- read & follow direction   0 0 0  Write a sentence   0 0 0  Copy design   0 0 0  Total score   20 20 20       07/16/2022    1:12 PM 01/04/2022   12:51 PM  Montreal Cognitive Assessment   Visuospatial/ Executive (0/5) 5 3  Naming (0/3) 3 3  Attention: Read list of digits (0/2) 2 2  Attention: Read list of letters (0/1) 1 1  Attention: Serial 7 subtraction starting at 100 (0/3) 3 0  Language: Repeat phrase (0/2) 2 2  Language : Fluency (0/1) 1 1  Abstraction (0/2) 2 2  Delayed Recall (0/5) 4 1  Orientation (0/6) 6 6  Total 29 21      11/04/2023    2:28 PM 10/19/2022   12:15 PM 10/13/2021   11:36 AM  6CIT Screen  What Year? 0 points 0 points 0 points  What month? 0 points 0 points 0 points  What time? 0 points 0 points 0 points  Count back from 20 0 points 0 points 0 points  Months in reverse 0 points 0 points 2 points  Repeat phrase 0  points 0 points 0 points  Total Score 0 points 0 points 2 points    Immunizations Immunization History  Administered Date(s) Administered   Fluad Quad(high Dose 65+) 10/14/2019,  08/22/2020, 08/22/2021, 07/13/2022   Fluad Trivalent(High Dose 65+) 09/12/2023   Influenza, High Dose Seasonal PF 08/13/2016, 07/31/2017, 07/28/2018   Influenza, Seasonal, Injecte, Preservative Fre 09/01/2009   Influenza,inj,Quad PF,6+ Mos 08/06/2013, 09/17/2014, 08/18/2015   Influenza-Unspecified 08/23/2019   Moderna Covid-19 Fall Seasonal Vaccine 77yrs & older 09/23/2023   Moderna Covid-19 Vaccine Bivalent Booster 46yrs & up 08/08/2021   Moderna Sars-Covid-2 Vaccination 02/16/2020, 03/17/2020, 12/01/2020   Pfizer(Comirnaty)Fall Seasonal Vaccine 12 years and older 09/02/2022   Pneumococcal Conjugate-13 09/22/2014   Pneumococcal Polysaccharide-23 05/15/2013   Respiratory Syncytial Virus Vaccine,Recomb Aduvanted(Arexvy) 11/04/2022   Td 10/14/2019   Tdap 01/26/2022    TDAP status: Up to date  Flu Vaccine status: Up to date  Pneumococcal vaccine status: Up to date  Covid-19 vaccine status: Completed vaccines  Qualifies for Shingles Vaccine? Yes   Zostavax completed No   Shingrix Completed?: No.    Education has been provided regarding the importance of this vaccine. Patient has been advised to call insurance company to determine out of pocket expense if they have not yet received this vaccine. Advised may also receive vaccine at local pharmacy or Health Dept. Verbalized acceptance and understanding.  Screening Tests Health Maintenance  Topic Date Due   Zoster Vaccines- Shingrix (1 of 2) Never done   MAMMOGRAM  03/21/2023   COVID-19 Vaccine (7 - 2024-25 season) 11/18/2023   Medicare Annual Wellness (AWV)  11/03/2024   DTaP/Tdap/Td (3 - Td or Tdap) 01/27/2032   Pneumonia Vaccine 25+ Years old  Completed   INFLUENZA VACCINE  Completed   DEXA SCAN  Completed   Hepatitis C Screening  Completed   HPV VACCINES  Aged Out   Colonoscopy  Discontinued    Health Maintenance  Health Maintenance Due  Topic Date Due   Zoster Vaccines- Shingrix (1 of 2) Never done   MAMMOGRAM   03/21/2023    Colorectal cancer screening: No longer required.   Mammogram status: No longer required due to 03/20/2022.  Bone Density status: Completed 06/18/2016. Results reflect: Bone density results: NORMAL. Repeat every 5 years.  Lung Cancer Screening: (Low Dose CT Chest recommended if Age 70-80 years, 20 pack-year currently smoking OR have quit w/in 15years.) does not qualify.   Lung Cancer Screening Referral: no  Additional Screening:  Hepatitis C Screening: does not qualify; Completed 09/19/2015  Vision Screening: Recommended annual ophthalmology exams for early detection of glaucoma and other disorders of the eye. Is the patient up to date with their annual eye exam?  Yes  Who is the provider or what is the name of the office in which the patient attends annual eye exams? [facility] If pt is not established with a provider, would they like to be referred to a provider to establish care? No .   Dental Screening: Recommended annual dental exams for proper oral hygiene  Diabetic Foot Exam: n/a  Community Resource Referral / Chronic Care Management: CRR required this visit?  No   CCM required this visit?  No    Plan:    I have personally reviewed and noted the following in the patient's chart:   Medical and social history Use of alcohol, tobacco or illicit drugs  Current medications and supplements including opioid prescriptions. Patient is not currently taking opioid prescriptions. Functional ability and status Nutritional status Physical activity  Advanced directives List of other physicians Hospitalizations, surgeries, and ER visits in previous 12 months Vitals Screenings to include cognitive, depression, and falls Referrals and appointments  In addition, I have reviewed and discussed with patient certain preventive protocols, quality metrics, and best practice recommendations. A written personalized care plan for preventive services as well as general  preventive health recommendations were provided to patient.    Sue Lush, LPN   [id number]   After Visit Summary: (Declined) Due to this being a telephonic visit, with patients personalized plan was offered to patient but patient Declined AVS at this time   Nurse Notes: The patient states she is doing well and has no concerns or questions at this time.

## 2023-11-05 ENCOUNTER — Encounter (HOSPITAL_BASED_OUTPATIENT_CLINIC_OR_DEPARTMENT_OTHER): Payer: Medicare Other | Admitting: General Surgery

## 2023-11-07 ENCOUNTER — Encounter (HOSPITAL_BASED_OUTPATIENT_CLINIC_OR_DEPARTMENT_OTHER): Payer: Medicare Other | Attending: General Surgery | Admitting: General Surgery

## 2023-11-07 DIAGNOSIS — I83893 Varicose veins of bilateral lower extremities with other complications: Secondary | ICD-10-CM | POA: Diagnosis not present

## 2023-11-07 DIAGNOSIS — R600 Localized edema: Secondary | ICD-10-CM | POA: Diagnosis not present

## 2023-11-07 DIAGNOSIS — I872 Venous insufficiency (chronic) (peripheral): Secondary | ICD-10-CM | POA: Diagnosis not present

## 2023-11-07 DIAGNOSIS — G629 Polyneuropathy, unspecified: Secondary | ICD-10-CM | POA: Diagnosis not present

## 2023-11-07 DIAGNOSIS — L97822 Non-pressure chronic ulcer of other part of left lower leg with fat layer exposed: Secondary | ICD-10-CM | POA: Diagnosis not present

## 2023-11-07 DIAGNOSIS — L97222 Non-pressure chronic ulcer of left calf with fat layer exposed: Secondary | ICD-10-CM | POA: Diagnosis not present

## 2023-11-08 NOTE — Progress Notes (Signed)
 ANTHEA, UDOVICH ([id number]) [id number] Page 1 of 11 Visit Report for 11/07/2023 Chief Complaint Document Details Patient Name: Date of Service: [name], [name] 11/07/2023 2:15 PM Medical Record Number: [id number] Patient Account Number: [id number] Date of Birth/Sex: Treating RN: [birth year] (78 y.o. F) Primary Care Provider: Rilla Baller Other Clinician: Referring Provider: Treating Provider/Extender: [name] Delon Rilla Baller [name]: 66 Information Obtained from: Patient Chief Complaint Patient presents for treatment of an open ulcer due to venous insufficiency Electronic Signature(s) Signed: 11/07/2023 2:57:47 PM By: [name] Delon MD FACS Entered By: [name] Delon on 11/07/2023 14:57:47 -------------------------------------------------------------------------------- Debridement Details Patient Name: Date of Service: [name] 11/07/2023 2:15 PM Medical Record Number: [id number] Patient Account Number: [id number] Date of Birth/Sex: Treating RN: [name] 22, 1946 (78 y.o. [name] Merleen Rock Primary Care Provider: Rilla Baller Other Clinician: Referring Provider: Treating Provider/Extender: [name] Delon Rilla Baller [name]: 66 Debridement Performed for Assessment: Wound #2 Left,Posterior Lower Leg Performed By: Physician [name] Delon, MD The following information was scribed by: Merleen Rock The information was scribed for: [name] Delon Debridement Type: Debridement Severity of Tissue Pre Debridement: Fat layer exposed Level of Consciousness (Pre-procedure): Awake and Alert Pre-procedure Verification/Time Out Yes - 14:45 Taken: Start Time: 14:45 Pain Control: Lidocaine  4% T opical Solution Percent of Wound Bed Debrided: 100% T Area Debrided (cm): otal 2.54 Tissue and other material debrided: Viable, Non-Viable, Eschar, Slough, Subcutaneous, Slough Level: Skin/Subcutaneous  Tissue Debridement Description: Excisional Instrument: Curette Bleeding: Minimum Hemostasis Achieved: Pressure Procedural Pain: 4 Post Procedural Pain: 2 Response to Treatment: Procedure was tolerated well Level of Consciousness (Post- Awake and Alert procedure): Post Debridement Measurements of Total Wound Length: (cm) 1.8 Width: (cm) 1.8 Depth: (cm) 0.1 Volume: (cm) 0.254 BEAULAH, ROMANEK J ([id number]) [id number] Page 2 of 11 Character of Wound/Ulcer Post Debridement: Improved Severity of Tissue Post Debridement: Fat layer exposed Post Procedure Diagnosis Same as Pre-procedure Electronic Signature(s) Signed: 11/07/2023 3:09:35 PM By: [name] Delon MD FACS Signed: 11/07/2023 4:36:15 PM By: Merleen Rock RN, [name] Entered By: Merleen Rock on 11/07/2023 14:47:37 -------------------------------------------------------------------------------- HPI Details Patient Name: Date of Service: [name], [name] 11/07/2023 2:15 PM Medical Record Number: [id number] Patient Account Number: [id number] Date of Birth/Sex: Treating RN: 05/19/1946 (78 y.o. F) Primary Care Provider: Rilla Baller Other Clinician: Referring Provider: Treating Provider/Extender: [name] Delon Rilla Baller [name]: 16 History of Present Illness HPI Description: ADMISSION This is a 78 year old woman with a history of chronic venous insufficiency status post saphenous vein ablations in 2010 and 2016. 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 She presents today with an open venous ulcer on her left lower leg. It has been present for a little over 2 weeks. She saw her primary care provider who apparently swabbed the wound and grew out Pseudomonas. She just completed a course of ciprofloxacin  for this. ABI was 0.91. She reports that she has had previous issues with ulcers in this same location, stemming back to a punch biopsy taken by a  dermatologist many years ago. She has had several skin substitutes applied to the area that have ultimately resulted in healing on prior occasions. She has 2 small ulcers on her left medial lower leg. There is slough accumulation in both of them. The more anterior of the 2 is quite small and has some soft slough on the surface, underneath which there is good granulation tissue. The more medial wound also has slough accumulation, but the underlying surface is fairly fibrotic and gritty. This is consistent with  her provided history of multiple ulcers in the same location. 08/03/2022: The anterior wound is smaller today with just a little bit of slough accumulation. The more medial wound continues to be very sensitive and fibrotic with slough buildup. 08/13/2022: The anterior wound is closed. The more medial wound remains sensitive with a fairly fibrotic surface. There is more granulation tissue filling in, however, and there is less slough than on prior occasions. 08/20/2022: The anterior wound remains closed. The more medial wound has filled with granulation tissue but still has a fair amount of slough on the surface and remains fairly tender. Unfortunately, she has developed 2 small ulcers just proximal to this. The fat layer is exposed in each. She says that over the weekend, she felt a burning sensation in that location. 08/27/2022: The 2 small wounds proximal to the main wound have merged into a single site. The wounds look a little bit dry, but they are quite clean without significant slough accumulation. They remain quite tender. 09/03/2022: All of the wounds have now merged into 1 large wound. There is a strong odor coming from the wound and the surface is not particularly viable. It is extremely painful for her today. 09/10/2022: The wound is less black and purple this week but still does not look particularly viable. The surface is desiccated. The culture that I took last week returned with a  polymicrobial population including Pseudomonas. I prescribed both Augmentin and levofloxacin . She has not yet picked up levofloxacin , as her pharmacy only notified her yesterday that it was ready. We have ordered a Keystone topical compounded antibiotic, but it has not yet come. 09/17/2022: The wound surface is markedly improved today. There is still an area of grayish-looking muscle but the rest appears significantly more viable. There is still a layer of slough on the surface. She still has a couple days left of oral antibiotic therapy. She has her Patsy compounded topical antibiotic with her today. 09/24/2022: She has completed her oral antibiotic therapy. The wound surface is much cleaner today and more viable without any necrotic tissue. It is a bit desiccated, however. 10/01/2022: The moisture balance of the wound has improved. There is a layer of yellow slough on the surface, but beneath this, the wound is more pink. 10/08/2022: The wound is smaller and cleaner today with a layer of slough present. She is having less pain. 12/18; this patient has a new wound on the right lateral ankle which apparently started after a cat scratched her. Secondarily she managed to drop a cake pan on the same area. This is very painful. The original wound was on the left posterior calf we have been using Keystone Prisma under an Foot locker. The Unna boot fell down and apparently she was in for a nurse visit perhaps last week and that 1 fell down as well This patient has central venous mediated hypertension. For member correctly she has a stent placed in the left common iliac artery by Dr. Sheree some years ago [facility] Thurner syndrome] she also has had venous ablations and I believe a history of a DVT The patient has compression pumps at home but she does not use them ANNELISA, [name] ([id number]) [id number] Page 3 of 11 10/30/2022: The patient says that her wounds burned all week with the  Hydrofera Blue classic in place. She has a fair amount of slough and nonviable subcutaneous tissue present on the right ankle. The left posterior calf wound is cleaner than I have seen it to  date. Both remain fairly tender. 11/14/2022: Both wounds have substantial slough and eschar accumulation. They remain extremely tender. 11/21/2022: The wounds are cleaner this week, but still have slough and eschar accumulation. She continues to describe burning pain in both wounds, but upon further questioning, she actually has burning in both of her feet that radiates up her legs and includes to the wounds. 11/28/2022: Both wounds have slough and eschar accumulation, as well as adherent silver alginate that was difficult to remove. She continues to have pain out of proportion to the extent of her wounds. Her PCP did initiate Lyrica  which she started taking last night. The dose is quite low, only 25 mg, but apparently there is a plan for upward titration, assuming she tolerates the medication. 12/05/2022: The wounds are both a little bit smaller today, but have significant slough accumulation, as usual. They remain exquisitely tender. She is now taking Lyrica  twice a day. 12/19/2022: Both of her wounds look significantly better this week. There is slough buildup on both, but they are smaller. She seems to be getting a good response from Lyrica  as she is having much less pain. 12/26/2022: The wound on her right lateral ankle is nearly closed. The wound on her left posterior calf is smaller but still has a lot of slough accumulation. They remain tender. 01/02/2023: The right lateral ankle wound is down to just a couple of millimeters. It is much less tender than on prior occasions. There is minimal slough buildup. The wound on her posterior calf continues to contract, as well. It has thick slough buildup and remains fairly tender. 01/09/2023: The right lateral ankle wound is nearly closed. He remains tender. There is just  a little eschar on the surface. The wound on her posterior calf has improved quite a bit. There is still slough on the surface, but the more proximal area has epithelialized substantially and there is no longer any gray discoloration to her tissue. 3/13; patient presents for follow-up. Her right lateral ankle wound is almost healed. She has a wound to her left posterior calf with slough and granulation tissue. We have been using Santyl under Unna boots bilaterally to the lower extremities. She has no issues or complaints today. 01/30/2023: The right lateral ankle wound is down to just a pinhole under a layer of eschar. The left posterior calf wound is quite a bit improved since the last time I saw it. There is still a layer of slough on the surface but there has been more epithelialization. 02/07/2023: The right lateral ankle wound still has some eschar on the surface. The left posterior calf wound is about the same size, but with more areas of epithelialization. 02/20/2023: The right lateral ankle wound is healed. The left posterior calf wound is smaller and is essentially flush with the surrounding skin, but the surface is quite dry. Her leg wrap slipped and her calf is quite swollen above the level of the wound. 03/01/2023: The right lateral ankle wound reopened and she also suffered a fall that resulted in a small wound on her right anterior tibial surface. The left posterior calf wound has a much better moisture balance today. There is a layer of slough on the surface. The Foot locker worked much better for her as far as compression this week. 03/06/2023: The right lateral ankle wound has a layer of eschar on the surface and is too painful for me to debride. The anterior tibial surface wound is more or less healed; it is a  very superficial abrasion at this point. The left posterior calf wound has a layer of slough on the surface. Her edema control is better. 03/13/2023: The right lateral ankle wound has a  layer of eschar on the surface. Once this was debrided, it was found to be healed. The anterior tibial surface wound has a layer of stuck silver alginate on the surface. This was removed to reveal a very superficial abrasion but not much change from last week. The left posterior calf wound is smaller and has its usual layer of accumulated slough. 03/25/2023: The anterior tibial surface wound has eschar and silver alginate stuck to it. Once this was removed, the wound was found to be healed. The left posterior calf wound is roughly the same size and has a little bit thicker layer of accumulated slough. 04/03/2023: She has had more drainage from the posterior calf wound over the last week and the layer of slough is thicker. She also says it is more tender. 04/10/2023: Continued increase in drainage from her wound. The wound remains tender with a layer of slough on the surface. It does not appear as viable this week. She also has erythema, swelling, and warmth in her entire left lower leg extending into the midfoot. 04/17/2023: The wound is about the same size this week. There is fibrotic adherent slough, but the drainage is not as profuse. She is currently taking levofloxacin . She is complaining of an increase in the burning sensation at her wound. 04/24/2023: The wound measured smaller this week and is much cleaner-looking. She completed her course of levofloxacin . Her PCP increased her dose of Lyrica  and she reports that the burning has stopped. 05/01/2023: The wound is smaller again today and quite clean with only minimal slough on the surface. 05/08/2023: The wound is smaller again today. It is fairly clean, but the surface is a bit fibrotic. Moisture balance is better. Her leg is more swollen and erythematous today, however. 05/15/2023: The wound is about the same size today. The surface has very little slough accumulation, but remains fibrotic. Moisture balance is good. She is responding nicely to the oral  Keflex  she is taking and her leg is less erythematous and edematous. 05/22/2023: The wound is unchanged in size. There is quite a bit of slough on the surface but the moisture balance is better. It is still fibrotic, but a little bit less so. Her leg is no longer erythematous, but she does have some persistent edema. 05/29/2023: The wound measured a little bit smaller today. It is dry again and the surface is quite fibrotic. The edema in her leg has improved. 06/12/2023: The wound measures slightly larger today. The moisture balance has improved markedly. There is soft slough on the wound surface. Edema control is improved. 06/19/2023: The wound measured slightly smaller today. There is a band of epithelium dividing the most proximal portion of the wound from the remainder. There is slough accumulation and moisture balance is improved. 06/26/2023: The band of epithelium between the 2 open areas has gotten wider. There is some slough accumulation on the surfaces along with a little bit of eschar. Moisture balance continues to be good. 07/03/2023: The band of epithelium continues to expand. The moisture balance remains good and the surface, particularly of the more distal aspect of the wound continues to improve. Slough and eschar accumulation, as usual. 07/10/2023: The wound measured smaller today. Moisture balance is good and the tissue underneath the layer of slough that has accumulated continues to improve. Berroa,  ROCK PARAS ([id number]) [id number] Page 4 of 11 07/17/2023: The wound is smaller again today. Moisture balance is appropriate. There is a little bit of slough overlying granulation tissue. 07/24/2023: The band of epithelium between the 2 open areas has gotten wider. The more proximal area has more epithelialization around the perimeter. Both have slough on the surface and remain fairly fibrotic. Moisture balance remains good. 08/07/2023: Both open areas are little bit  smaller today. The band of epithelium between the 2 continues to enlarge. There is slough and eschar on both surfaces. 08/14/2023: No significant change to the wound dimensions. The surface is rather dry and fibrotic again. 08/21/2023: The wound is smaller and the skin bridge between the 2 open areas has gotten wider. There is slough accumulation on the surfaces and the moisture balance is improved. 08/28/2023: Both open areas of the wound are smaller today. There is slough on the surface, but it is quite soft. Moisture balance is good. 09/04/2023: The band of epithelium between the 2 open areas continues to widen. The wound is very sensitive today, more so than usual. No concern for infection based upon visual inspection, however. There is some slough on the wound surface, which remains fairly fibrotic. 09/11/2023: Both open areas are little bit smaller today. There is slough accumulation on the surface. The underlying tissue is still somewhat fibrotic, but the color is good. Edema control is good. 09/17/2023: The wound continues to improve. The open areas are smaller and the band of skin has gotten wider. The tissue color is possibly the best that I have ever seen it although it does remain somewhat fibrotic. There is slough on the wound surface. 09/25/2023: The wound is smaller again today. The slough on the surface is rubbery but the underlying tissue continues to improve, quality-wise. It is less fibrotic today. Edema control is good. 10/01/2023: The wound continues to contract. There is slough on the surface. The underlying tissue continues to be less fibrotic from week to week. Unfortunately, she has reopened an area on her right posterior lower leg. It extends to the fat layer and there is slough accumulation on the surface. This occurred despite Unna boot compression. 10/23/2023: The right leg wound has healed. The wound on her left leg has contracted further. The band of skin between the 2  open areas has widened. There is some slough accumulation on the surface. 11/07/2023: The more proximal open portion of her wound is almost completely closed. There is a little bit of eschar on the surface. The distal aspect is smaller and the tissue continues to improve in quality. There is thick slough accumulation. Her wrap slid down and edema control is inadequate. Electronic Signature(s) Signed: 11/07/2023 2:58:37 PM By: [name] Nest MD FACS Entered By: [name] Nest on 11/07/2023 14:58:37 -------------------------------------------------------------------------------- Physical Exam Details Patient Name: Date of Service: RUTHMARY, OCCHIPINTI 11/07/2023 2:15 PM Medical Record Number: [id number] Patient Account Number: [id number] Date of Birth/Sex: Treating RN: 22-Feb-1946 (78 y.o. F) Primary Care Provider: Rilla Baller Other Clinician: Referring Provider: Treating Provider/Extender: [name] Nest Rilla Baller [name]: 50 Constitutional . . . . no acute distress. Respiratory Normal work of breathing on room air.. Notes 11/07/2023: The more proximal open portion of her wound is almost completely closed. There is a little bit of eschar on the surface. The distal aspect is smaller and the tissue continues to improve in quality. There is thick slough accumulation. Her wrap slid down and edema control is inadequate. Electronic Signature(s) Signed: 11/07/2023 2:59:34  PM By: [name] Nest MD FACS Entered By: [name] Nest on 11/07/2023 14:59:34 Amber ROCK PARAS ([id number]) [id number] Page 5 of 11 -------------------------------------------------------------------------------- Physician Orders Details Patient Name: Date of Service: SHRUTI, ARREY 11/07/2023 2:15 PM Medical Record Number: [id number] Patient Account Number: [id number] Date of Birth/Sex: Treating RN: [birth year] (78 y.o. [name] Aver, Aranza Primary Care Provider: Rilla Baller Other Clinician: Referring Provider: Treating Provider/Extender: [name] Nest Rilla Baller [name]: 84 The following information was scribed by: Aver Rock The information was scribed for: [name] Nest Verbal / Phone Orders: No Diagnosis Coding ICD-10 Coding Code Description [phone] Non-pressure chronic ulcer of other part of left lower leg with fat layer exposed R60.0 Localized edema I83.893 Varicose veins of bilateral lower extremities with other complications I87.2 Venous insufficiency (chronic) (peripheral) G62.9 Polyneuropathy, unspecified Follow-up Appointments ppointment in 1 week. - Dr. [name] Return A 1/8 @ 2:15 pm Anesthetic Wound #2 Left,Posterior Lower Leg (In clinic) Topical Lidocaine  4% applied to wound bed Bathing/ Shower/ Hygiene May shower with protection but do not get wound dressing(s) wet. Protect dressing(s) with water repellant cover (for example, large plastic bag) or a cast cover and may then take shower. Additional Orders / Instructions Other: - Recommend Nail Care Lymphedema Treatment Plan - Exercise, Compression and Elevation Bilateral Lower Extremities Exercise daily as tolerated. (Walking, ROM, Calf Pumps and Toe Taps) Compression Garments, Class II. Wear daily when out of bed. May remove at bedtime - right leg Compression Wraps as ordered - left leg Elevate legs 30 - 60 minutes at or above heart level at least 3 - 4 times daily as able/tolerated Avoid standing for long periods and elevate leg(s) parallel to the floor when sitting Use Pneumatic Compression Device on leg(s) 2-3 times a day for 45-60 minutes Wound Treatment Wound #2 - Lower Leg Wound Laterality: Left, Posterior Cleanser: Soap and Water 1 x Per Week/30 Days Discharge Instructions: May shower and wash wound with dial antibacterial soap and water prior to dressing change. Peri-Wound Care: Zinc  Oxide Ointment 30g tube 1 x Per Week/30 Days Discharge  Instructions: Apply Zinc  Oxide to periwound with each dressing change Peri-Wound Care: Sween Lotion (Moisturizing lotion) 1 x Per Week/30 Days Discharge Instructions: Apply moisturizing lotion as directed Topical: Gentamicin 1 x Per Week/30 Days Discharge Instructions: As directed by physician Topical: Mupirocin Ointment 1 x Per Week/30 Days Discharge Instructions: Apply Mupirocin (Bactroban) as instructed Topical: Skintegrity Hydrogel 4 (oz) 1 x Per Week/30 Days Discharge Instructions: Apply hydrogel as directed Prim Dressing: Promogran Prisma Matrix, 4.34 (sq in) (silver collagen) 1 x Per Week/30 Days ary Discharge Instructions: Moisten collagen with saline or hydrogel Secondary Dressing: Optifoam Non-Adhesive Dressing, 4x4 in 1 x Per Week/30 Days Discharge Instructions: Apply over primary dressing as directed. Compression Wrap: CoFlex Calamine Unna Boot 4 x 6 (in/yd) 1 x Per Week/30 Days Discharge Instructions: Apply Coflex Calamine [employer] as directed. ISLEY, WEISHEIT ([id number]) [id number] Page 6 of 11 Electronic Signature(s) Signed: 11/07/2023 3:09:35 PM By: [name] Nest MD FACS Entered By: [name] Nest on 11/07/2023 15:02:39 -------------------------------------------------------------------------------- Problem List Details Patient Name: Date of Service: BRAYLA, PAT 11/07/2023 2:15 PM Medical Record Number: [id number] Patient Account Number: [id number] Date of Birth/Sex: Treating RN: 02/06/1946 (78 y.o. [name] Aver Rock Primary Care Provider: Rilla Baller Other Clinician: Referring Provider: Treating Provider/Extender: [name] Nest Rilla Baller [name]: 72 Active Problems ICD-10 Encounter Code Description Active Date MDM Diagnosis [phone] Non-pressure chronic ulcer of other part of left lower leg with fat layer exposed  07/27/2022 No Yes R60.0 Localized edema 07/27/2022 No Yes P16.106 Varicose veins of  bilateral lower extremities with other complications 07/27/2022 No Yes I87.2 Venous insufficiency (chronic) (peripheral) 07/27/2022 No Yes G62.9 Polyneuropathy, unspecified 07/27/2022 No Yes Inactive Problems ICD-10 Code Description Active Date Inactive Date L03.115 Cellulitis of right lower limb 10/22/2022 10/22/2022 Resolved Problems ICD-10 Code Description Active Date Resolved Date L97.818 Non-pressure chronic ulcer of other part of right lower leg with other specified severity 10/01/2023 10/22/2022 Electronic Signature(s) Signed: 11/07/2023 2:57:30 PM By: [name] Nest MD FACS Entered By: [name] Nest on 11/07/2023 14:57:30 Amber ROCK PARAS ([id number]) [id number] Page 7 of 11 -------------------------------------------------------------------------------- Progress Note Details Patient Name: Date of Service: [name], [name] 11/07/2023 2:15 PM Medical Record Number: [id number] Patient Account Number: [id number] Date of Birth/Sex: Treating RN: 06-09-1946 (78 y.o. F) Primary Care Provider: Rilla Baller Other Clinician: Referring Provider: Treating Provider/Extender: [name] Nest Rilla Baller [name]: 2 Subjective Chief Complaint Information obtained from Patient Patient presents for treatment of an open ulcer due to venous insufficiency History of Present Illness (HPI) ADMISSION This is a 78 year old woman with a history of chronic venous insufficiency status post saphenous vein ablations in 2010 and 2016. 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 She presents today with an open venous ulcer on her left lower leg. It has been present for a little over 2 weeks. She saw her primary care provider who apparently swabbed the wound and grew out Pseudomonas. She just completed a course of ciprofloxacin  for this. ABI was 0.91. She reports that she has had previous issues with ulcers in this same location,  stemming back to a punch biopsy taken by a dermatologist many years ago. She has had several skin substitutes applied to the area that have ultimately resulted in healing on prior occasions. She has 2 small ulcers on her left medial lower leg. There is slough accumulation in both of them. The more anterior of the 2 is quite small and has some soft slough on the surface, underneath which there is good granulation tissue. The more medial wound also has slough accumulation, but the underlying surface is fairly fibrotic and gritty. This is consistent with her provided history of multiple ulcers in the same location. 08/03/2022: The anterior wound is smaller today with just a little bit of slough accumulation. The more medial wound continues to be very sensitive and fibrotic with slough buildup. 08/13/2022: The anterior wound is closed. The more medial wound remains sensitive with a fairly fibrotic surface. There is more granulation tissue filling in, however, and there is less slough than on prior occasions. 08/20/2022: The anterior wound remains closed. The more medial wound has filled with granulation tissue but still has a fair amount of slough on the surface and remains fairly tender. Unfortunately, she has developed 2 small ulcers just proximal to this. The fat layer is exposed in each. She says that over the weekend, she felt a burning sensation in that location. 08/27/2022: The 2 small wounds proximal to the main wound have merged into a single site. The wounds look a little bit dry, but they are quite clean without significant slough accumulation. They remain quite tender. 09/03/2022: All of the wounds have now merged into 1 large wound. There is a strong odor coming from the wound and the surface is not particularly viable. It is extremely painful for her today. 09/10/2022: The wound is less black and purple this week but still does not look particularly viable. The  surface is desiccated. The  culture that I took last week returned with a polymicrobial population including Pseudomonas. I prescribed both Augmentin and levofloxacin . She has not yet picked up levofloxacin , as her pharmacy only notified her yesterday that it was ready. We have ordered a Keystone topical compounded antibiotic, but it has not yet come. 09/17/2022: The wound surface is markedly improved today. There is still an area of grayish-looking muscle but the rest appears significantly more viable. There is still a layer of slough on the surface. She still has a couple days left of oral antibiotic therapy. She has her Patsy compounded topical antibiotic with her today. 09/24/2022: She has completed her oral antibiotic therapy. The wound surface is much cleaner today and more viable without any necrotic tissue. It is a bit desiccated, however. 10/01/2022: The moisture balance of the wound has improved. There is a layer of yellow slough on the surface, but beneath this, the wound is more pink. 10/08/2022: The wound is smaller and cleaner today with a layer of slough present. She is having less pain. 12/18; this patient has a new wound on the right lateral ankle which apparently started after a cat scratched her. Secondarily she managed to drop a cake pan on the same area. This is very painful. The original wound was on the left posterior calf we have been using Keystone Prisma under an Foot locker. The Unna boot fell down and apparently she was in for a nurse visit perhaps last week and that 1 fell down as well This patient has central venous mediated hypertension. For member correctly she has a stent placed in the left common iliac artery by Dr. Sheree some years ago [facility] Thurner syndrome] she also has had venous ablations and I believe a history of a DVT The patient has compression pumps at home but she does not use them 10/30/2022: The patient says that her wounds burned all week with the Hydrofera Blue classic in place.  She has a fair amount of slough and nonviable subcutaneous tissue present on the right ankle. The left posterior calf wound is cleaner than I have seen it to date. Both remain fairly tender. 11/14/2022: Both wounds have substantial slough and eschar accumulation. They remain extremely tender. 11/21/2022: The wounds are cleaner this week, but still have slough and eschar accumulation. She continues to describe burning pain in both wounds, but upon further questioning, she actually has burning in both of her feet that radiates up her legs and includes to the wounds. 11/28/2022: Both wounds have slough and eschar accumulation, as well as adherent silver alginate that was difficult to remove. She continues to have pain out of proportion to the extent of her wounds. Her PCP did initiate Lyrica  which she started taking last night. The dose is quite low, only 25 mg, but apparently there is a plan for upward titration, assuming she tolerates the medication. [name], [name] ([id number]) [id number] Page 8 of 11 12/05/2022: The wounds are both a little bit smaller today, but have significant slough accumulation, as usual. They remain exquisitely tender. She is now taking Lyrica  twice a day. 12/19/2022: Both of her wounds look significantly better this week. There is slough buildup on both, but they are smaller. She seems to be getting a good response from Lyrica  as she is having much less pain. 12/26/2022: The wound on her right lateral ankle is nearly closed. The wound on her left posterior calf is smaller but still has a lot of slough accumulation.  They remain tender. 01/02/2023: The right lateral ankle wound is down to just a couple of millimeters. It is much less tender than on prior occasions. There is minimal slough buildup. The wound on her posterior calf continues to contract, as well. It has thick slough buildup and remains fairly tender. 01/09/2023: The right lateral ankle wound is  nearly closed. He remains tender. There is just a little eschar on the surface. The wound on her posterior calf has improved quite a bit. There is still slough on the surface, but the more proximal area has epithelialized substantially and there is no longer any gray discoloration to her tissue. 3/13; patient presents for follow-up. Her right lateral ankle wound is almost healed. She has a wound to her left posterior calf with slough and granulation tissue. We have been using Santyl under Unna boots bilaterally to the lower extremities. She has no issues or complaints today. 01/30/2023: The right lateral ankle wound is down to just a pinhole under a layer of eschar. The left posterior calf wound is quite a bit improved since the last time I saw it. There is still a layer of slough on the surface but there has been more epithelialization. 02/07/2023: The right lateral ankle wound still has some eschar on the surface. The left posterior calf wound is about the same size, but with more areas of epithelialization. 02/20/2023: The right lateral ankle wound is healed. The left posterior calf wound is smaller and is essentially flush with the surrounding skin, but the surface is quite dry. Her leg wrap slipped and her calf is quite swollen above the level of the wound. 03/01/2023: The right lateral ankle wound reopened and she also suffered a fall that resulted in a small wound on her right anterior tibial surface. The left posterior calf wound has a much better moisture balance today. There is a layer of slough on the surface. The Foot locker worked much better for her as far as compression this week. 03/06/2023: The right lateral ankle wound has a layer of eschar on the surface and is too painful for me to debride. The anterior tibial surface wound is more or less healed; it is a very superficial abrasion at this point. The left posterior calf wound has a layer of slough on the surface. Her edema control is  better. 03/13/2023: The right lateral ankle wound has a layer of eschar on the surface. Once this was debrided, it was found to be healed. The anterior tibial surface wound has a layer of stuck silver alginate on the surface. This was removed to reveal a very superficial abrasion but not much change from last week. The left posterior calf wound is smaller and has its usual layer of accumulated slough. 03/25/2023: The anterior tibial surface wound has eschar and silver alginate stuck to it. Once this was removed, the wound was found to be healed. The left posterior calf wound is roughly the same size and has a little bit thicker layer of accumulated slough. 04/03/2023: She has had more drainage from the posterior calf wound over the last week and the layer of slough is thicker. She also says it is more tender. 04/10/2023: Continued increase in drainage from her wound. The wound remains tender with a layer of slough on the surface. It does not appear as viable this week. She also has erythema, swelling, and warmth in her entire left lower leg extending into the midfoot. 04/17/2023: The wound is about the same size this week.  There is fibrotic adherent slough, but the drainage is not as profuse. She is currently taking levofloxacin . She is complaining of an increase in the burning sensation at her wound. 04/24/2023: The wound measured smaller this week and is much cleaner-looking. She completed her course of levofloxacin . Her PCP increased her dose of Lyrica  and she reports that the burning has stopped. 05/01/2023: The wound is smaller again today and quite clean with only minimal slough on the surface. 05/08/2023: The wound is smaller again today. It is fairly clean, but the surface is a bit fibrotic. Moisture balance is better. Her leg is more swollen and erythematous today, however. 05/15/2023: The wound is about the same size today. The surface has very little slough accumulation, but remains fibrotic. Moisture  balance is good. She is responding nicely to the oral Keflex  she is taking and her leg is less erythematous and edematous. 05/22/2023: The wound is unchanged in size. There is quite a bit of slough on the surface but the moisture balance is better. It is still fibrotic, but a little bit less so. Her leg is no longer erythematous, but she does have some persistent edema. 05/29/2023: The wound measured a little bit smaller today. It is dry again and the surface is quite fibrotic. The edema in her leg has improved. 06/12/2023: The wound measures slightly larger today. The moisture balance has improved markedly. There is soft slough on the wound surface. Edema control is improved. 06/19/2023: The wound measured slightly smaller today. There is a band of epithelium dividing the most proximal portion of the wound from the remainder. There is slough accumulation and moisture balance is improved. 06/26/2023: The band of epithelium between the 2 open areas has gotten wider. There is some slough accumulation on the surfaces along with a little bit of eschar. Moisture balance continues to be good. 07/03/2023: The band of epithelium continues to expand. The moisture balance remains good and the surface, particularly of the more distal aspect of the wound continues to improve. Slough and eschar accumulation, as usual. 07/10/2023: The wound measured smaller today. Moisture balance is good and the tissue underneath the layer of slough that has accumulated continues to improve. 07/17/2023: The wound is smaller again today. Moisture balance is appropriate. There is a little bit of slough overlying granulation tissue. 07/24/2023: The band of epithelium between the 2 open areas has gotten wider. The more proximal area has more epithelialization around the perimeter. Both have slough on the surface and remain fairly fibrotic. Moisture balance remains good. 08/07/2023: Both open areas are little bit smaller today. The band of  epithelium between the 2 continues to enlarge. There is slough and eschar on both surfaces. 08/14/2023: No significant change to the wound dimensions. The surface is rather dry and fibrotic again. 08/21/2023: The wound is smaller and the skin bridge between the 2 open areas has gotten wider. There is slough accumulation on the surfaces and the moisture balance is improved. COUMBA, KELLISON ([id number]) [id number] Page 9 of 11 08/28/2023: Both open areas of the wound are smaller today. There is slough on the surface, but it is quite soft. Moisture balance is good. 09/04/2023: The band of epithelium between the 2 open areas continues to widen. The wound is very sensitive today, more so than usual. No concern for infection based upon visual inspection, however. There is some slough on the wound surface, which remains fairly fibrotic. 09/11/2023: Both open areas are little bit smaller today. There is slough accumulation on the  surface. The underlying tissue is still somewhat fibrotic, but the color is good. Edema control is good. 09/17/2023: The wound continues to improve. The open areas are smaller and the band of skin has gotten wider. The tissue color is possibly the best that I have ever seen it although it does remain somewhat fibrotic. There is slough on the wound surface. 09/25/2023: The wound is smaller again today. The slough on the surface is rubbery but the underlying tissue continues to improve, quality-wise. It is less fibrotic today. Edema control is good. 10/01/2023: The wound continues to contract. There is slough on the surface. The underlying tissue continues to be less fibrotic from week to week. Unfortunately, she has reopened an area on her right posterior lower leg. It extends to the fat layer and there is slough accumulation on the surface. This occurred despite Unna boot compression. 10/23/2023: The right leg wound has healed. The wound on her left leg  has contracted further. The band of skin between the 2 open areas has widened. There is some slough accumulation on the surface. 11/07/2023: The more proximal open portion of her wound is almost completely closed. There is a little bit of eschar on the surface. The distal aspect is smaller and the tissue continues to improve in quality. There is thick slough accumulation. Her wrap slid down and edema control is inadequate. Objective Constitutional no acute distress. Vitals Time Taken: 2:18 AM, Height: 63 in, Weight: 200 lbs, BMI: 35.4, Temperature: 97.9 F, Pulse: 65 bpm, Respiratory Rate: 18 breaths/min, Blood Pressure: 138/57 mmHg. Respiratory Normal work of breathing on room air.. General Notes: 11/07/2023: The more proximal open portion of her wound is almost completely closed. There is a little bit of eschar on the surface. The distal aspect is smaller and the tissue continues to improve in quality. There is thick slough accumulation. Her wrap slid down and edema control is inadequate. Integumentary (Hair, Skin) Wound #2 status is Open. Original cause of wound was Blister. The date acquired was: 07/06/2022. The wound has been in treatment 66 weeks. The wound is located on the Left,Posterior Lower Leg. The wound measures 1.8cm length x 1.8cm width x 0.1cm depth; 2.545cm^2 area and 0.254cm^3 volume. There is Fat Layer (Subcutaneous Tissue) exposed. There is no tunneling or undermining noted. There is a medium amount of serosanguineous drainage noted. The wound margin is distinct with the outline attached to the wound base. There is medium (34-66%) red granulation within the wound bed. There is a medium (34-66%) amount of necrotic tissue within the wound bed. The periwound skin appearance had no abnormalities noted for texture. The periwound skin appearance had no abnormalities noted for moisture. The periwound skin appearance exhibited: Hemosiderin Staining. Periwound temperature was noted as No  Abnormality. Assessment Active Problems ICD-10 Non-pressure chronic ulcer of other part of left lower leg with fat layer exposed Localized edema Varicose veins of bilateral lower extremities with other complications Venous insufficiency (chronic) (peripheral) Polyneuropathy, unspecified Procedures Wound #2 Pre-procedure diagnosis of Wound #2 is a Venous Leg Ulcer located on the Left,Posterior Lower Leg .Severity of Tissue Pre Debridement is: Fat layer exposed. There was a Excisional Skin/Subcutaneous Tissue Debridement with a total area of 2.54 sq cm performed by [name] Nest, MD. With the following instrument(s): Curette to remove Viable and Non-Viable tissue/material. Material removed includes Eschar, Subcutaneous Tissue, and Slough after achieving pain control using Lidocaine  4% Topical Solution. No specimens were taken. A time out was conducted at 14:45, prior to the start of  the procedure. A Minimum amount of bleeding was controlled with Pressure. The procedure was tolerated well with a pain level of 4 throughout and a pain level of 2 following the procedure. Post Debridement Measurements: 1.8cm length x 1.8cm width x 0.1cm depth; 0.254cm^3 volume. Character of Wound/Ulcer Post Debridement is improved. Severity of Tissue Post Debridement is: Fat layer exposed. Post procedure Diagnosis Wound #2: Same as Pre-Procedure LAYANI, FORONDA ([id number]) [phone].pdf Page 10 of 11 Pre-procedure diagnosis of Wound #2 is a Venous Leg Ulcer located on the Left,Posterior Lower Leg . There was a Double Layer Compression Therapy Procedure by Merleen Rock, RN. Post procedure Diagnosis Wound #2: Same as Pre-Procedure Notes: calamine coflex. Plan Follow-up Appointments: Return Appointment in 1 week. - Dr. [name] 1/8 @ 2:15 pm Anesthetic: Wound #2 Left,Posterior Lower Leg: (In clinic) Topical Lidocaine  4% applied to wound bed Bathing/ Shower/ Hygiene: May shower with  protection but do not get wound dressing(s) wet. Protect dressing(s) with water repellant cover (for example, large plastic bag) or a cast cover and may then take shower. Additional Orders / Instructions: Other: - Recommend Nail Care Lymphedema Treatment Plan - Exercise, Compression and Elevation: Exercise daily as tolerated. (Walking, ROM, Calf Pumps and T T oe aps) Compression Garments, Class II. Wear daily when out of bed. May remove at bedtime - right leg Compression Wraps as ordered - left leg Elevate legs 30 - 60 minutes at or above heart level at least 3 - 4 times daily as able/tolerated Avoid standing for long periods and elevate leg(s) parallel to the floor when sitting Use Pneumatic Compression Device on leg(s) 2-3 times a day for 45-60 minutes WOUND #2: - Lower Leg Wound Laterality: Left, Posterior Cleanser: Soap and Water 1 x Per Week/30 Days Discharge Instructions: May shower and wash wound with dial antibacterial soap and water prior to dressing change. Peri-Wound Care: Zinc  Oxide Ointment 30g tube 1 x Per Week/30 Days Discharge Instructions: Apply Zinc  Oxide to periwound with each dressing change Peri-Wound Care: Sween Lotion (Moisturizing lotion) 1 x Per Week/30 Days Discharge Instructions: Apply moisturizing lotion as directed Topical: Gentamicin 1 x Per Week/30 Days Discharge Instructions: As directed by physician Topical: Mupirocin Ointment 1 x Per Week/30 Days Discharge Instructions: Apply Mupirocin (Bactroban) as instructed Topical: Skintegrity Hydrogel 4 (oz) 1 x Per Week/30 Days Discharge Instructions: Apply hydrogel as directed Prim Dressing: Promogran Prisma Matrix, 4.34 (sq in) (silver collagen) 1 x Per Week/30 Days ary Discharge Instructions: Moisten collagen with saline or hydrogel Secondary Dressing: Optifoam Non-Adhesive Dressing, 4x4 in 1 x Per Week/30 Days Discharge Instructions: Apply over primary dressing as directed. Com pression Wrap: CoFlex Calamine  Unna Boot 4 x 6 (in/yd) 1 x Per Week/30 Days Discharge Instructions: Apply Coflex Calamine [employer] as directed. 11/07/2023: The more proximal open portion of her wound is almost completely closed. There is a little bit of eschar on the surface. The distal aspect is smaller and the tissue continues to improve in quality. There is thick slough accumulation. Her wrap slid down and edema control is inadequate. I used a curette to debride slough, eschar, and subcutaneous tissue from her wound. We will continue the mixture of topical gentamicin and mupirocin with Prisma silver collagen, moistened with hydrogel and covered with Optifoam to maintain adequate moisture balance. Continue 3 layer compression/equivalent or calamine-based Unna boot. Follow-up in 1 week. Electronic Signature(s) Signed: 11/07/2023 3:03:50 PM By: [name] Nest MD FACS Entered By: [name] Nest on 11/07/2023 15:03:50 -------------------------------------------------------------------------------- SuperBill Details Patient Name: Date  of Service: [name], [name] 11/07/2023 Medical Record Number: [id number] Patient Account Number: [id number] Date of Birth/Sex: Treating RN: [birth year] (78 y.o. F) Primary Care Provider: Rilla Baller Other Clinician: Referring Provider: Treating Provider/Extender: [name] Delon Rilla Baller [name]: [address] J ([id number]) [id number] Page 11 of 11 ICD-10 Codes Code Description [id number] Non-pressure chronic ulcer of other part of left lower leg with fat layer exposed R60.0 Localized edema I83.893 Varicose veins of bilateral lower extremities with other complications I87.2 Venous insufficiency (chronic) (peripheral) G62.9 Polyneuropathy, unspecified Facility Procedures : CPT4 Code: [id number] Description: 11042 - DEB SUBQ TISSUE 20 SQ CM/< ICD-10 Diagnosis Description L97.822 Non-pressure chronic ulcer of other part of  left lower leg with fat layer expo R60.0 Localized edema I83.893 Varicose veins of bilateral lower extremities with other  complications I87.2 Venous insufficiency (chronic) (peripheral) Modifier: sed Quantity: 1 Physician Procedures : CPT4 Code Description Modifier [id number] 99214 - WC PHYS LEVEL 4 - EST PT ICD-10 Diagnosis Description L97.822 Non-pressure chronic ulcer of other part of left lower leg with fat layer exposed R60.0 Localized edema I83.893 Varicose veins of bilateral  lower extremities with other complications I87.2 Venous insufficiency (chronic) (peripheral) Quantity: 1 : [id number] 11042 - WC PHYS SUBQ TISS 20 SQ CM ICD-10 Diagnosis Description L97.822 Non-pressure chronic ulcer of other part of left lower leg with fat layer exposed R60.0 Localized edema I83.893 Varicose veins of bilateral lower extremities with other  complications I87.2 Venous insufficiency (chronic) (peripheral) Quantity: 1 Electronic Signature(s) Signed: 11/07/2023 3:04:12 PM By: [name] Delon MD FACS Entered By: [name] Delon on 11/07/2023 15:04:12

## 2023-11-08 NOTE — Progress Notes (Signed)
 MIRI, JOSE ([id number]) [id number] Page 1 of 7 Visit Report for 11/07/2023 Arrival Information Details Patient Name: Date of Service: BELLAMARIE, PFLUG 11/07/2023 2:15 PM Medical Record Number: [id number] Patient Account Number: [id number] Date of Birth/Sex: Treating RN: 1946-10-17 (78 y.o. F) Primary Care Amazin Pincock: Rilla Baller Other Clinician: Referring Jeremy Mclamb: Treating Fatimah Sundquist/Extender: [name] Delon Rilla Baller [name]: 56 Visit Information History Since Last Visit Added or deleted any medications: No Patient Arrived: Ambulatory Any new allergies or adverse reactions: No Arrival Time: 14:17 Had a fall or experienced change in No Accompanied By: self activities of daily living that may affect Transfer Assistance: None risk of falls: Patient Identification Verified: Yes Signs or symptoms of abuse/neglect since last visito No Secondary Verification Process Completed: Yes Hospitalized since last visit: No Patient Requires Transmission-Based Precautions: No Implantable device outside of the clinic excluding No Patient Has Alerts: No cellular tissue based products placed in the center since last visit: Has Dressing in Place as Prescribed: Yes Has Compression in Place as Prescribed: Yes Pain Present Now: No Electronic Signature(s) Signed: 11/07/2023 4:36:15 PM By: Merleen Rock RN, [name] Entered By: Boehlein, Taysia on 11/07/2023 14:36:17 -------------------------------------------------------------------------------- Compression Therapy Details Patient Name: Date of Service: ZULEMA ROCK ALF 11/07/2023 2:15 PM Medical Record Number: [id number] Patient Account Number: [id number] Date of Birth/Sex: Treating RN: 04-Oct-1946 (78 y.o. [name] Merleen Rock Primary Care Davion Flannery: Rilla Baller Other Clinician: Referring Bernard Slayden: Treating [place] Searles/Extender: [name] Delon Rilla Baller [name]: 66 Compression Therapy  Performed for Wound Assessment: Wound #2 Left,Posterior Lower Leg Performed By: Clinician Merleen Rock, RN Compression Type: Double Layer Post Procedure Diagnosis Same as Pre-procedure Notes calamine coflex Electronic Signature(s) Signed: 11/07/2023 4:36:15 PM By: Boehlein, Yuktha RN, [name] Entered By: Boehlein, Tayten on 11/07/2023 14:45:09 ZULEMA ROCK PARAS ([id number]) [id number] Page 2 of 7 -------------------------------------------------------------------------------- Encounter Discharge Information Details Patient Name: Date of Service: MARLICIA, SROKA 11/07/2023 2:15 PM Medical Record Number: [id number] Patient Account Number: [id number] Date of Birth/Sex: Treating RN: 13-[name]-1946 (78 y.o. [name] Merleen Rock Primary Care Kastiel Simonian: Rilla Baller Other Clinician: Referring Darryn Kydd: Treating Leonila Speranza/Extender: [name] Delon Rilla Baller [name]: 84 Encounter Discharge Information Items Post Procedure Vitals Discharge Condition: Stable Temperature (F): 97.9 Ambulatory Status: Ambulatory Pulse (bpm): 65 Discharge Destination: Home Respiratory Rate (breaths/min): 18 Transportation: Private Auto Blood Pressure (mmHg): 138/57 Accompanied By: self Schedule Follow-up Appointment: Yes Clinical Summary of Care: Patient Declined Electronic Signature(s) Signed: 11/07/2023 4:36:15 PM By: Merleen Rock RN, [name] Entered By: Merleen Rock on 11/07/2023 15:03:47 -------------------------------------------------------------------------------- Lower Extremity Assessment Details Patient Name: Date of Service: AHMONI, EDGE 11/07/2023 2:15 PM Medical Record Number: [id number] Patient Account Number: [id number] Date of Birth/Sex: Treating RN: 1946-04-20 (78 y.o. [name] Merleen Rock Primary Care Brunilda Eble: Rilla Baller Other Clinician: Referring Dvante Hands: Treating Louvenia Golomb/Extender: [name] Delon Rilla Baller [name]:  66 Edema Assessment Assessed: [Left: No] [Right: No] Edema: [Left: Ye] [Right: s] Calf Left: Right: Point of Measurement: 31 cm From Medial Instep 47 cm Ankle Left: Right: Point of Measurement: 9 cm From Medial Instep 22.5 cm Vascular Assessment Pulses: Dorsalis Pedis Palpable: [Left:Yes] Extremity colors, hair growth, and conditions: Extremity Color: [Left:Hyperpigmented] Hair Growth on Extremity: [Left:Yes] Temperature of Extremity: [Left:Warm] Capillary Refill: [Left:< 3 seconds] Dependent Rubor: [Left:No No] Electronic Signature(s) MIMI, DEBELLIS ([id number]) [id number] Page 3 of 7 Signed: 11/07/2023 4:36:15 PM By: Merleen Rock RN, [name] Entered By: Merleen Rock on 11/07/2023 14:38:31 -------------------------------------------------------------------------------- Multi Wound Chart Details Patient Name: Date of Service: ZULEMA ROCK PARAS. 11/07/2023 2:15 PM Medical Record  Number: [id number] Patient Account Number: [id number] Date of Birth/Sex: Treating RN: [birth year] (77 y.o. F) Primary Care Darianna Amy: Rilla Baller Other Clinician: Referring Shakinah Navis: Treating Dracen Reigle/Extender: [name] Delon Rilla Baller [name]: 66 Vital Signs Height(in): 63 Pulse(bpm): 65 Weight(lbs): 200 Blood Pressure(mmHg): 138/57 Body Mass Index(BMI): 35.4 Temperature(F): 97.9 Respiratory Rate(breaths/min): 18 [2:Photos:] [N/A:N/A] Left, Posterior Lower Leg N/A N/A Wound Location: Blister N/A N/A Wounding Event: Venous Leg Ulcer N/A N/A Primary Etiology: Cataracts, Asthma, Hypertension, N/A N/A Comorbid History: Neuropathy, Received Chemotherapy, Received Radiation 07/06/2022 N/A N/A Date Acquired: 40 N/A N/A Weeks of Treatment: Open N/A N/A Wound Status: No N/A N/A Wound Recurrence: Yes N/A N/A Clustered Wound: 2 N/A N/A Clustered Quantity: 1.8x1.8x0.1 N/A N/A Measurements L x W x D (cm) 2.545 N/A N/A A (cm) : rea 0.254 N/A  N/A Volume (cm) : 63.00% N/A N/A % Reduction in A rea: 63.00% N/A N/A % Reduction in Volume: Full Thickness Without Exposed N/A N/A Classification: Support Structures Medium N/A N/A Exudate A mount: Serosanguineous N/A N/A Exudate Type: red, brown N/A N/A Exudate Color: Distinct, outline attached N/A N/A Wound Margin: Medium (34-66%) N/A N/A Granulation A mount: Red N/A N/A Granulation Quality: Medium (34-66%) N/A N/A Necrotic A mount: Fat Layer (Subcutaneous Tissue): Yes N/A N/A Exposed Structures: Fascia: No Tendon: No Muscle: No Joint: No Bone: No Small (1-33%) N/A N/A Epithelialization: Debridement - Excisional N/A N/A Debridement: Pre-procedure Verification/Time Out 14:45 N/A N/A Taken: Lidocaine  4% Topical Solution N/A N/A Pain Control: Necrotic/Eschar, Subcutaneous, N/A N/A Tissue Debrided: Slough Skin/Subcutaneous Tissue N/A N/A Level: 2.54 N/A N/A Debridement A (sq cm): rea Curette N/A N/A InstrumentKIOSHA, BUCHAN ([id number]) [id number] Page 4 of 7 Minimum N/A N/A Bleeding: Pressure N/A N/A Hemostasis A chieved: 4 N/A N/A Procedural Pain: 2 N/A N/A Post Procedural Pain: Procedure was tolerated well N/A N/A Debridement Treatment Response: 1.8x1.8x0.1 N/A N/A Post Debridement Measurements L x W x D (cm) 0.254 N/A N/A Post Debridement Volume: (cm) No Abnormalities Noted N/A N/A Periwound Skin Texture: No Abnormalities Noted N/A N/A Periwound Skin Moisture: Hemosiderin Staining: Yes N/A N/A Periwound Skin Color: No Abnormality N/A N/A Temperature: Compression Therapy N/A N/A Procedures Performed: Debridement Treatment Notes Electronic Signature(s) Signed: 11/07/2023 2:57:37 PM By: [name] Delon MD FACS Entered By: [name] Delon on 11/07/2023 14:57:37 -------------------------------------------------------------------------------- Multi-Disciplinary Care Plan Details Patient Name: Date of  Service: ZULEMA ROCK ALF 11/07/2023 2:15 PM Medical Record Number: [id number] Patient Account Number: [id number] Date of Birth/Sex: Treating RN: [birth year] (78 y.o. [name] Merleen Rock Primary Care Grafton Warzecha: Rilla Baller Other Clinician: Referring Yianna Tersigni: Treating Jannely Henthorn/Extender: [name] Delon Rilla Baller [name]: 66 Multidisciplinary Care Plan reviewed with physician Active Inactive Venous Leg Ulcer Nursing Diagnoses: Knowledge deficit related to disease process and management Potential for venous Insuffiency (use before diagnosis confirmed) Goals: Patient will maintain optimal edema control Date Initiated: 02/07/2023 Target Resolution Date: 11/20/2023 Goal Status: Active Interventions: Assess peripheral edema status every visit. Compression as ordered Treatment Activities: Therapeutic compression applied : 02/07/2023 Notes: Electronic Signature(s) Signed: 11/07/2023 4:36:15 PM By: Merleen Rock RN, [name] Entered By: Boehlein, Rosalina on 11/07/2023 14:40:29 ZULEMA ROCK JINNY ([id number]) [id number] Page 5 of 7 -------------------------------------------------------------------------------- Pain Assessment Details Patient Name: Date of Service: DANYIEL, CRESPIN 11/07/2023 2:15 PM Medical Record Number: [id number] Patient Account Number: [id number] Date of Birth/Sex: Treating RN: 1946/05/01 (78 y.o. F) Primary Care Roby Spalla: Rilla Baller Other Clinician: Referring Somnang Mahan: Treating Anisa Leanos/Extender: [name] Delon Rilla Baller [name]: 66 Active Problems Location of Pain Severity and Description of Pain  Patient Has Paino No Site Locations Rate the pain. Current Pain Level: 0 Pain Management and Medication Current Pain Management: Electronic Signature(s) Signed: 11/07/2023 4:36:15 PM By: Merleen Handing RN, [name] Entered By: Boehlein, Charmelle on 11/07/2023  14:36:34 -------------------------------------------------------------------------------- Patient/Caregiver Education Details Patient Name: Date of Service: ZULEMA HANDING ALF 1/2/2025andnbsp2:15 PM Medical Record Number: [id number] Patient Account Number: [id number] Date of Birth/Gender: Treating RN: 1946-06-08 (78 y.o. [name] Merleen Handing Primary Care Physician: Rilla Baller Other Clinician: Referring Physician: Treating Physician/Extender: [name] Delon Rilla Baller [name]: 77 Education Assessment Education Provided To: Patient Education Topics Provided Venous: Methods: Explain/Verbal Responses: Reinforcements needed, State content correctly Electronic Signature(s) Signed: 11/07/2023 4:36:15 PM By: Merleen Handing RN, [name] Entered By: Boehlein, Ardell on 11/07/2023 14:40:45 ZULEMA HANDING PARAS ([id number]) [id number] Page 6 of 7 -------------------------------------------------------------------------------- Wound Assessment Details Patient Name: Date of Service: ALERA, QUEVEDO 11/07/2023 2:15 PM Medical Record Number: [id number] Patient Account Number: [id number] Date of Birth/Sex: Treating RN: 1946/06/13 (78 y.o. [name] Merleen, Amarea Primary Care Antonetta Clanton: Rilla Baller Other Clinician: Referring Mohmmad Saleeby: Treating Herminia Warren/Extender: [name] Delon Rilla Baller Weeks in Treatment: 66 Wound Status Wound Number: 2 Primary Venous Leg Ulcer Etiology: Wound Location: Left, Posterior Lower Leg Wound Open Wounding Event: Blister Status: Date Acquired: 07/06/2022 Comorbid Cataracts, Asthma, Hypertension, Neuropathy, Received Weeks Of Treatment: 66 History: Chemotherapy, Received Radiation Clustered Wound: Yes Photos Wound Measurements Length: (cm) Width: (cm) Depth: (cm) Clustered Quantity: Area: (cm) Volume: (cm) 1.8 % Reduction in Area: 63% 1.8 % Reduction in Volume: 63% 0.1 Epithelialization: Small (1-33%) 2  Tunneling: No 2.545 Undermining: No 0.254 Wound Description Classification: Full Thickness Without Exposed Sup Wound Margin: Distinct, outline attached Exudate Amount: Medium Exudate Type: Serosanguineous Exudate Color: red, brown port Structures Foul Odor After Cleansing: No Slough/Fibrino Yes Wound Bed Granulation Amount: Medium (34-66%) Exposed Structure Granulation Quality: Red Fascia Exposed: No Necrotic Amount: Medium (34-66%) Fat Layer (Subcutaneous Tissue) Exposed: Yes Tendon Exposed: No Muscle Exposed: No Joint Exposed: No Bone Exposed: No Periwound Skin Texture Texture Color No Abnormalities Noted: Yes No Abnormalities Noted: No Hemosiderin Staining: Yes Moisture No Abnormalities Noted: Yes Temperature / Pain Temperature: No Abnormality Treatment Notes Wound #2 (Lower Leg) Wound Laterality: Left, Posterior FLORENTINA, MARQUART ([id number]) [id number] Page 7 of 7 Cleanser Soap and Water Discharge Instruction: May shower and wash wound with dial antibacterial soap and water prior to dressing change. Peri-Wound Care Zinc  Oxide Ointment 30g tube Discharge Instruction: Apply Zinc  Oxide to periwound with each dressing change Sween Lotion (Moisturizing lotion) Discharge Instruction: Apply moisturizing lotion as directed Topical Gentamicin Discharge Instruction: As directed by physician Mupirocin Ointment Discharge Instruction: Apply Mupirocin (Bactroban) as instructed Skintegrity Hydrogel 4 (oz) Discharge Instruction: Apply hydrogel as directed Primary Dressing Promogran Prisma Matrix, 4.34 (sq in) (silver collagen) Discharge Instruction: Moisten collagen with saline or hydrogel Secondary Dressing Optifoam Non-Adhesive Dressing, 4x4 in Discharge Instruction: Apply over primary dressing as directed. Secured With Compression Wrap CoFlex Calamine Unna Boot 4 x 6 (in/yd) Discharge Instruction: Apply Coflex [employer] as  directed. Compression Stockings Add-Ons Electronic Signature(s) Signed: 11/07/2023 4:36:15 PM By: Boehlein, [place] RN, [name] Entered By: Boehlein, Dorreen on 11/07/2023 14:39:40 -------------------------------------------------------------------------------- Vitals Details Patient Name: Date of Service: ZULEMA HANDING ALF 11/07/2023 2:15 PM Medical Record Number: [id number] Patient Account Number: [id number] Date of Birth/Sex: Treating RN: Aug 29, 1946 (78 y.o. F) Primary Care Curstin Schmale: Rilla Baller Other Clinician: Referring Kiahna Banghart: Treating Kathlee Barnhardt/Extender: [name] Delon Rilla Baller [name]: 66 Vital Signs Time Taken: 02:18 Temperature (F): 97.9 Height (in): 63 Pulse (bpm):  65 Weight (lbs): 200 Respiratory Rate (breaths/min): 18 Body Mass Index (BMI): 35.4 Blood Pressure (mmHg): 138/57 Reference Range: 80 - 120 mg / dl Electronic Signature(s) Signed: 11/07/2023 4:36:15 PM By: Boehlein, Midori RN, [name] Entered By: Boehlein, Ronniesha on 11/07/2023 14:36:28

## 2023-11-13 ENCOUNTER — Encounter (HOSPITAL_BASED_OUTPATIENT_CLINIC_OR_DEPARTMENT_OTHER): Payer: Medicare Other | Admitting: General Surgery

## 2023-11-13 DIAGNOSIS — G629 Polyneuropathy, unspecified: Secondary | ICD-10-CM | POA: Diagnosis not present

## 2023-11-13 DIAGNOSIS — I83893 Varicose veins of bilateral lower extremities with other complications: Secondary | ICD-10-CM | POA: Diagnosis not present

## 2023-11-13 DIAGNOSIS — L97812 Non-pressure chronic ulcer of other part of right lower leg with fat layer exposed: Secondary | ICD-10-CM | POA: Diagnosis not present

## 2023-11-13 DIAGNOSIS — L97822 Non-pressure chronic ulcer of other part of left lower leg with fat layer exposed: Secondary | ICD-10-CM | POA: Diagnosis not present

## 2023-11-13 DIAGNOSIS — I872 Venous insufficiency (chronic) (peripheral): Secondary | ICD-10-CM | POA: Diagnosis not present

## 2023-11-13 DIAGNOSIS — L97222 Non-pressure chronic ulcer of left calf with fat layer exposed: Secondary | ICD-10-CM | POA: Diagnosis not present

## 2023-11-13 DIAGNOSIS — R600 Localized edema: Secondary | ICD-10-CM | POA: Diagnosis not present

## 2023-11-13 NOTE — Progress Notes (Addendum)
 [name], [name] ([id number]) [id number] Page 1 of 12 Visit Report for 11/13/2023 Chief Complaint Document Details Patient Name: Date of Service: [name], [name] 11/13/2023 2:15 PM Medical Record Number: [id number] Patient Account Number: [id number] Date of Birth/Sex: Treating RN: 1946-05-10 (78 y.o. F) Primary Care Provider: Rilla Baller Other Clinician: Referring Provider: Treating Provider/Extender: [name] Delon Rilla Baller [name]: 46 Information Obtained from: Patient Chief Complaint Patient presents for treatment of an open ulcer due to venous insufficiency Electronic Signature(s) Signed: 11/13/2023 3:16:57 PM By: [name] Delon MD FACS Entered By: [name] Delon on 11/13/2023 15:16:57 -------------------------------------------------------------------------------- Debridement Details Patient Name: Date of Service: [name] 11/13/2023 2:15 PM Medical Record Number: [id number] Patient Account Number: [id number] Date of Birth/Sex: Treating RN: [birth year] (78 y.o. [name] Primary Care Provider: Rilla Baller Other Clinician: Referring Provider: Treating Provider/Extender: [name] Delon Rilla Baller [name]: 67 Debridement Performed for Assessment: Wound #2 Left,Posterior Lower Leg Performed By: Physician [name] Delon, MD The following information was scribed by: Cammie [name] The information was scribed for: [name] Delon Debridement Type: Debridement Severity of Tissue Pre Debridement: Fat layer exposed Level of Consciousness (Pre-procedure): Awake and Alert Pre-procedure Verification/Time Out Yes - 14:45 Taken: Start Time: 14:45 Pain Control: Lidocaine  4% Topical Solution Percent of Wound Bed Debrided: 100% T Area Debrided (cm): otal 1.63 Tissue and other material debrided: Non-Viable, Eschar, Slough, Subcutaneous, Slough Level: Skin/Subcutaneous Tissue Debridement  Description: Excisional Instrument: Curette Bleeding: Minimum Hemostasis Achieved: Pressure Response to Treatment: Procedure was tolerated well Level of Consciousness (Post- Awake and Alert procedure): Post Debridement Measurements of Total Wound Length: (cm) 1.6 Width: (cm) 1.3 Depth: (cm) 0.1 Volume: (cm) 0.163 Character of Wound/Ulcer Post Debridement: Improved Severity of Tissue Post Debridement: Fat layer exposed [name], [name] ([id number]) [id number] Page 2 of 12 Post Procedure Diagnosis Same as Pre-procedure Electronic Signature(s) Signed: 11/13/2023 3:11:11 PM By: Cammie Sailors RN, [name] Signed: 11/13/2023 3:28:53 PM By: [name] Delon MD FACS Entered By: Cammie [name] on 11/13/2023 14:46:16 -------------------------------------------------------------------------------- HPI Details Patient Name: Date of Service: [name], [name] 11/13/2023 2:15 PM Medical Record Number: [id number] Patient Account Number: [id number] Date of Birth/Sex: Treating RN: 02-[name]-1946 (78 y.o. F) Primary Care Provider: Rilla Baller Other Clinician: Referring Provider: Treating Provider/Extender: [name] Delon Rilla Baller [name]: 22 History of Present Illness HPI Description: ADMISSION This is a 78 year old woman with a history of chronic venous insufficiency status post saphenous vein ablations in 2010 and 2016. 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 She presents today with an open venous ulcer on her left lower leg. It has been present for a little over 2 weeks. She saw her primary care provider who apparently swabbed the wound and grew out Pseudomonas. She just completed a course of ciprofloxacin  for this. ABI was 0.91. She reports that she has had previous issues with ulcers in this same location, stemming back to a punch biopsy taken by a dermatologist many years ago. She has had several skin substitutes applied to  the area that have ultimately resulted in healing on prior occasions. She has 2 small ulcers on her left medial lower leg. There is slough accumulation in both of them. The more anterior of the 2 is quite small and has some soft slough on the surface, underneath which there is good granulation tissue. The more medial wound also has slough accumulation, but the underlying surface is fairly fibrotic and gritty. This is consistent with her provided history of multiple ulcers in the same  location. 08/03/2022: The anterior wound is smaller today with just a little bit of slough accumulation. The more medial wound continues to be very sensitive and fibrotic with slough buildup. 08/13/2022: The anterior wound is closed. The more medial wound remains sensitive with a fairly fibrotic surface. There is more granulation tissue filling in, however, and there is less slough than on prior occasions. 08/20/2022: The anterior wound remains closed. The more medial wound has filled with granulation tissue but still has a fair amount of slough on the surface and remains fairly tender. Unfortunately, she has developed 2 small ulcers just proximal to this. The fat layer is exposed in each. She says that over the weekend, she felt a burning sensation in that location. 08/27/2022: The 2 small wounds proximal to the main wound have merged into a single site. The wounds look a little bit dry, but they are quite clean without significant slough accumulation. They remain quite tender. 09/03/2022: All of the wounds have now merged into 1 large wound. There is a strong odor coming from the wound and the surface is not particularly viable. It is extremely painful for her today. 09/10/2022: The wound is less black and purple this week but still does not look particularly viable. The surface is desiccated. The culture that I took last week returned with a polymicrobial population including Pseudomonas. I prescribed both Augmentin and  levofloxacin . She has not yet picked up levofloxacin , as her pharmacy only notified her yesterday that it was ready. We have ordered a Keystone topical compounded antibiotic, but it has not yet come. 09/17/2022: The wound surface is markedly improved today. There is still an area of grayish-looking muscle but the rest appears significantly more viable. There is still a layer of slough on the surface. She still has a couple days left of oral antibiotic therapy. She has her Patsy compounded topical antibiotic with her today. 09/24/2022: She has completed her oral antibiotic therapy. The wound surface is much cleaner today and more viable without any necrotic tissue. It is a bit desiccated, however. 10/01/2022: The moisture balance of the wound has improved. There is a layer of yellow slough on the surface, but beneath this, the wound is more pink. 10/08/2022: The wound is smaller and cleaner today with a layer of slough present. She is having less pain. 12/18; this patient has a new wound on the right lateral ankle which apparently started after a cat scratched her. Secondarily she managed to drop a cake pan on the same area. This is very painful. The original wound was on the left posterior calf we have been using Keystone Prisma under an Foot locker. The Unna boot fell down and apparently she was in for a nurse visit perhaps last week and that 1 fell down as well This patient has central venous mediated hypertension. For member correctly she has a stent placed in the left common iliac artery by Dr. Sheree some years ago [facility] Thurner syndrome] she also has had venous ablations and I believe a history of a DVT The patient has compression pumps at home but she does not use them 10/30/2022: The patient says that her wounds burned all week with the Hydrofera Blue classic in place. She has a fair amount of slough and nonviable subcutaneous tissue present on the right ankle. The left posterior calf wound is  cleaner than I have seen it to date. Both remain fairly tender. KHANH, CORDNER ([id number]) [id number] Page 3 of 12 11/14/2022: Both wounds have  substantial slough and eschar accumulation. They remain extremely tender. 11/21/2022: The wounds are cleaner this week, but still have slough and eschar accumulation. She continues to describe burning pain in both wounds, but upon further questioning, she actually has burning in both of her feet that radiates up her legs and includes to the wounds. 11/28/2022: Both wounds have slough and eschar accumulation, as well as adherent silver alginate that was difficult to remove. She continues to have pain out of proportion to the extent of her wounds. Her PCP did initiate Lyrica  which she started taking last night. The dose is quite low, only 25 mg, but apparently there is a plan for upward titration, assuming she tolerates the medication. 12/05/2022: The wounds are both a little bit smaller today, but have significant slough accumulation, as usual. They remain exquisitely tender. She is now taking Lyrica  twice a day. 12/19/2022: Both of her wounds look significantly better this week. There is slough buildup on both, but they are smaller. She seems to be getting a good response from Lyrica  as she is having much less pain. 12/26/2022: The wound on her right lateral ankle is nearly closed. The wound on her left posterior calf is smaller but still has a lot of slough accumulation. They remain tender. 01/02/2023: The right lateral ankle wound is down to just a couple of millimeters. It is much less tender than on prior occasions. There is minimal slough buildup. The wound on her posterior calf continues to contract, as well. It has thick slough buildup and remains fairly tender. 01/09/2023: The right lateral ankle wound is nearly closed. He remains tender. There is just a little eschar on the surface. The wound on her posterior calf has improved  quite a bit. There is still slough on the surface, but the more proximal area has epithelialized substantially and there is no longer any gray discoloration to her tissue. 3/13; patient presents for follow-up. Her right lateral ankle wound is almost healed. She has a wound to her left posterior calf with slough and granulation tissue. We have been using Santyl under Unna boots bilaterally to the lower extremities. She has no issues or complaints today. 01/30/2023: The right lateral ankle wound is down to just a pinhole under a layer of eschar. The left posterior calf wound is quite a bit improved since the last time I saw it. There is still a layer of slough on the surface but there has been more epithelialization. 02/07/2023: The right lateral ankle wound still has some eschar on the surface. The left posterior calf wound is about the same size, but with more areas of epithelialization. 02/20/2023: The right lateral ankle wound is healed. The left posterior calf wound is smaller and is essentially flush with the surrounding skin, but the surface is quite dry. Her leg wrap slipped and her calf is quite swollen above the level of the wound. 03/01/2023: The right lateral ankle wound reopened and she also suffered a fall that resulted in a small wound on her right anterior tibial surface. The left posterior calf wound has a much better moisture balance today. There is a layer of slough on the surface. The Foot locker worked much better for her as far as compression this week. 03/06/2023: The right lateral ankle wound has a layer of eschar on the surface and is too painful for me to debride. The anterior tibial surface wound is more or less healed; it is a very superficial abrasion at this point. The left posterior  calf wound has a layer of slough on the surface. Her edema control is better. 03/13/2023: The right lateral ankle wound has a layer of eschar on the surface. Once this was debrided, it was found to be  healed. The anterior tibial surface wound has a layer of stuck silver alginate on the surface. This was removed to reveal a very superficial abrasion but not much change from last week. The left posterior calf wound is smaller and has its usual layer of accumulated slough. 03/25/2023: The anterior tibial surface wound has eschar and silver alginate stuck to it. Once this was removed, the wound was found to be healed. The left posterior calf wound is roughly the same size and has a little bit thicker layer of accumulated slough. 04/03/2023: She has had more drainage from the posterior calf wound over the last week and the layer of slough is thicker. She also says it is more tender. 04/10/2023: Continued increase in drainage from her wound. The wound remains tender with a layer of slough on the surface. It does not appear as viable this week. She also has erythema, swelling, and warmth in her entire left lower leg extending into the midfoot. 04/17/2023: The wound is about the same size this week. There is fibrotic adherent slough, but the drainage is not as profuse. She is currently taking levofloxacin . She is complaining of an increase in the burning sensation at her wound. 04/24/2023: The wound measured smaller this week and is much cleaner-looking. She completed her course of levofloxacin . Her PCP increased her dose of Lyrica  and she reports that the burning has stopped. 05/01/2023: The wound is smaller again today and quite clean with only minimal slough on the surface. 05/08/2023: The wound is smaller again today. It is fairly clean, but the surface is a bit fibrotic. Moisture balance is better. Her leg is more swollen and erythematous today, however. 05/15/2023: The wound is about the same size today. The surface has very little slough accumulation, but remains fibrotic. Moisture balance is good. She is responding nicely to the oral Keflex  she is taking and her leg is less erythematous and  edematous. 05/22/2023: The wound is unchanged in size. There is quite a bit of slough on the surface but the moisture balance is better. It is still fibrotic, but a little bit less so. Her leg is no longer erythematous, but she does have some persistent edema. 05/29/2023: The wound measured a little bit smaller today. It is dry again and the surface is quite fibrotic. The edema in her leg has improved. 06/12/2023: The wound measures slightly larger today. The moisture balance has improved markedly. There is soft slough on the wound surface. Edema control is improved. 06/19/2023: The wound measured slightly smaller today. There is a band of epithelium dividing the most proximal portion of the wound from the remainder. There is slough accumulation and moisture balance is improved. 06/26/2023: The band of epithelium between the 2 open areas has gotten wider. There is some slough accumulation on the surfaces along with a little bit of eschar. Moisture balance continues to be good. 07/03/2023: The band of epithelium continues to expand. The moisture balance remains good and the surface, particularly of the more distal aspect of the wound continues to improve. Slough and eschar accumulation, as usual. 07/10/2023: The wound measured smaller today. Moisture balance is good and the tissue underneath the layer of slough that has accumulated continues to improve. 07/17/2023: The wound is smaller again today. Moisture balance is  appropriate. There is a little bit of slough overlying granulation tissue. [name], [name] ([id number]) [id number] Page 4 of 12 07/24/2023: The band of epithelium between the 2 open areas has gotten wider. The more proximal area has more epithelialization around the perimeter. Both have slough on the surface and remain fairly fibrotic. Moisture balance remains good. 08/07/2023: Both open areas are little bit smaller today. The band of epithelium between the 2 continues  to enlarge. There is slough and eschar on both surfaces. 08/14/2023: No significant change to the wound dimensions. The surface is rather dry and fibrotic again. 08/21/2023: The wound is smaller and the skin bridge between the 2 open areas has gotten wider. There is slough accumulation on the surfaces and the moisture balance is improved. 08/28/2023: Both open areas of the wound are smaller today. There is slough on the surface, but it is quite soft. Moisture balance is good. 09/04/2023: The band of epithelium between the 2 open areas continues to widen. The wound is very sensitive today, more so than usual. No concern for infection based upon visual inspection, however. There is some slough on the wound surface, which remains fairly fibrotic. 09/11/2023: Both open areas are little bit smaller today. There is slough accumulation on the surface. The underlying tissue is still somewhat fibrotic, but the color is good. Edema control is good. 09/17/2023: The wound continues to improve. The open areas are smaller and the band of skin has gotten wider. The tissue color is possibly the best that I have ever seen it although it does remain somewhat fibrotic. There is slough on the wound surface. 09/25/2023: The wound is smaller again today. The slough on the surface is rubbery but the underlying tissue continues to improve, quality-wise. It is less fibrotic today. Edema control is good. 10/01/2023: The wound continues to contract. There is slough on the surface. The underlying tissue continues to be less fibrotic from week to week. Unfortunately, she has reopened an area on her right posterior lower leg. It extends to the fat layer and there is slough accumulation on the surface. This occurred despite Unna boot compression. 10/23/2023: The right leg wound has healed. The wound on her left leg has contracted further. The band of skin between the 2 open areas has widened. There is some slough accumulation on  the surface. 11/07/2023: The more proximal open portion of her wound is almost completely closed. There is a little bit of eschar on the surface. The distal aspect is smaller and the tissue continues to improve in quality. There is thick slough accumulation. Her wrap slid down and edema control is inadequate. 11/13/2023: The more proximal open portion of her wound is even smaller today, but remains open underneath some eschar. The distal aspect continues to contract and epithelialize. There is slough on the surface. She has a new area on her medial right lower leg that is draining clear fluid. There is no obvious wound but clearly there has been a violation of the skin. Electronic Signature(s) Signed: 11/13/2023 3:17:46 PM By: [name] Nest MD FACS Entered By: [name] Nest on 11/13/2023 15:17:45 -------------------------------------------------------------------------------- Physical Exam Details Patient Name: Date of Service: [name], [name] 11/13/2023 2:15 PM Medical Record Number: [id number] Patient Account Number: [id number] Date of Birth/Sex: Treating RN: 1945-11-13 (78 y.o. F) Primary Care Provider: Rilla Baller Other Clinician: Referring Provider: Treating Provider/Extender: [name] Nest Rilla Baller [name]: 60 Constitutional Hypertensive, asymptomatic. . . . no acute distress. Respiratory Normal work of breathing on room air.[name]  Notes 11/13/2023: The more proximal open portion of her wound is even smaller today, but remains open underneath some eschar. The distal aspect continues to contract and epithelialize. There is slough on the surface. She has a new area on her medial right lower leg that is draining clear fluid. There is no obvious wound but clearly there has been a violation of the skin. Electronic Signature(s) Signed: 11/13/2023 3:19:01 PM By: [name] Nest MD FACS Entered By: [name] Nest on 11/13/2023 15:19:01 Amber ROCK PARAS ([id number])  [id number] Page 5 of 12 -------------------------------------------------------------------------------- Physician Orders Details Patient Name: Date of Service: [name], [name] 11/13/2023 2:15 PM Medical Record Number: [id number] Patient Account Number: [id number] Date of Birth/Sex: Treating RN: 1946/07/19 (78 y.o. [name] Primary Care Provider: Rilla Baller Other Clinician: Referring Provider: Treating Provider/Extender: [name] Nest Rilla Baller [name]: 53 Verbal / Phone Orders: No Diagnosis Coding ICD-10 Coding Code Description [phone]) [phone] Non-pressure chronic ulcer of other part of left lower leg with fat layer exposed R60.0 Localized edema I83.893 Varicose veins of bilateral lower extremities with other complications I87.2 Venous insufficiency (chronic) (peripheral) G62.9 Polyneuropathy, unspecified Follow-up Appointments ppointment in 1 week. - Dr. [name] Return A 1/14 @ 2:45 pm Bathing/ Shower/ Hygiene May shower with protection but do not get wound dressing(s) wet. Protect dressing(s) with water repellant cover (for example, large plastic bag) or a cast cover and may then take shower. Additional Orders / Instructions Other: - Recommend Nail Care Lymphedema Treatment Plan - Exercise, Compression and Elevation Bilateral Lower Extremities Exercise daily as tolerated. (Walking, ROM, Calf Pumps and Toe Taps) Compression Garments, Class II. Wear daily when out of bed. May remove at bedtime - right leg Compression Wraps as ordered - left leg Elevate legs 30 - 60 minutes at or above heart level at least 3 - 4 times daily as able/tolerated Avoid standing for long periods and elevate leg(s) parallel to the floor when sitting Use Pneumatic Compression Device on leg(s) 2-3 times a day for 45-60 minutes Wound Treatment Wound #2 - Lower Leg Wound Laterality: Left, Posterior Cleanser: Soap and Water 1 x Per Week/30  Days Discharge Instructions: May shower and wash wound with dial antibacterial soap and water prior to dressing change. Peri-Wound Care: Zinc  Oxide Ointment 30g tube 1 x Per Week/30 Days Discharge Instructions: Apply Zinc  Oxide to periwound with each dressing change Peri-Wound Care: Sween Lotion (Moisturizing lotion) 1 x Per Week/30 Days Discharge Instructions: Apply moisturizing lotion as directed Topical: Gentamicin 1 x Per Week/30 Days Discharge Instructions: As directed by physician Topical: Mupirocin Ointment 1 x Per Week/30 Days Discharge Instructions: Apply Mupirocin (Bactroban) as instructed Topical: Skintegrity Hydrogel 4 (oz) 1 x Per Week/30 Days Discharge Instructions: Apply hydrogel as directed Prim Dressing: Promogran Prisma Matrix, 4.34 (sq in) (silver collagen) 1 x Per Week/30 Days ary Discharge Instructions: Moisten collagen with saline or hydrogel Secondary Dressing: Optifoam Non-Adhesive Dressing, 4x4 in 1 x Per Week/30 Days Discharge Instructions: Apply over primary dressing as directed. Compression Wrap: CoFlex Calamine Unna Boot 4 x 6 (in/yd) (DME) (Dispense As Written) 1 x Per Week/30 Days Discharge Instructions: Apply Coflex [employer]amine [employer] as directed. Wound #7 - Lower Leg Wound Laterality: Right, Medial [name], [name] ([id number]) [phone].pdf Page 6 of 12 Cleanser: Soap and Water 1 x Per Week/30 Days Discharge Instructions: May shower and wash wound with dial antibacterial soap and water prior to dressing change. Peri-Wound Care: Sween Lotion (Moisturizing lotion) 1 x Per Week/30 Days Discharge Instructions: Apply moisturizing lotion as  directed Prim Dressing: Maxorb Extra Ag+ Alginate Dressing, 4x4.75 (in/in) 1 x Per Week/30 Days ary Discharge Instructions: Apply to wound bed as instructed Secondary Dressing: ABD Pad, 8x10 1 x Per Week/30 Days Discharge Instructions: Apply over primary dressing as directed. Compression Wrap:  CoFlex Calamine Unna Boot 4 x 6 (in/yd) (DME) (Dispense As Written) 1 x Per Week/30 Days Discharge Instructions: Apply Coflex [employer]amine [employer] as directed. Patient Medications llergies: sulfa antibiotics, Voltaren , doxycycline, Neoprene, adhesive, latex A Notifications Medication Indication Start End 11/14/2023 lidocaine  DOSE topical 4 % cream - cream topical once daily Electronic Signature(s) Signed: 11/14/2023 4:46:16 PM By: Boehlein, Elliotte RN, [name] Signed: 11/15/2023 8:14:31 AM By: [name] Nest MD FACS Previous Signature: 11/13/2023 3:28:53 PM Version By: [name] Nest MD FACS Previous Signature: 11/13/2023 3:11:11 PM Version By: Cammie Sailors RN, [name] Entered By: Merleen Handing on 11/14/2023 15:45:57 -------------------------------------------------------------------------------- Problem List Details Patient Name: Date of Service: MAIZEE, REINHOLD 11/13/2023 2:15 PM Medical Record Number: [id number] Patient Account Number: [id number] Date of Birth/Sex: Treating RN: 07-20-1946 (78 y.o. F) Primary Care Provider: Rilla Baller Other Clinician: Referring Provider: Treating Provider/Extender: [name] Nest Rilla Baller [name]: 78 Active Problems ICD-10 Encounter Code Description Active Date MDM Diagnosis [phone] Non-pressure chronic ulcer of other part of left lower leg with fat layer exposed 07/27/2022 No Yes R60.0 Localized edema 07/27/2022 No Yes I83.893 Varicose veins of bilateral lower extremities with other complications 07/27/2022 No Yes I87.2 Venous insufficiency (chronic) (peripheral) 07/27/2022 No Yes G62.9 Polyneuropathy, unspecified 07/27/2022 No Yes MAKAYLYNN, BONILLAS ([id number]) [phone].pdf Page 7 of 12 Inactive Problems ICD-10 Code Description Active Date Inactive Date L03.115 Cellulitis of right lower limb 10/22/2022 10/22/2022 Resolved Problems ICD-10 Code Description Active Date Resolved Date L97.818 Non-pressure  chronic ulcer of other part of right lower leg with other specified severity 10/01/2023 10/22/2022 Electronic Signature(s) Signed: 11/13/2023 3:14:50 PM By: [name] Nest MD FACS Entered By: [name] Nest on 11/13/2023 15:14:50 -------------------------------------------------------------------------------- Progress Note Details Patient Name: Date of Service: [name] 11/13/2023 2:15 PM Medical Record Number: [id number] Patient Account Number: [id number] Date of Birth/Sex: Treating RN: 11/30/1945 (78 y.o. F) Primary Care Provider: Rilla Baller Other Clinician: Referring Provider: Treating Provider/Extender: [name] Nest Rilla Baller [name]: 90 Subjective Chief Complaint Information obtained from Patient Patient presents for treatment of an open ulcer due to venous insufficiency History of Present Illness (HPI) ADMISSION This is a 78 year old woman with a history of chronic venous insufficiency status post saphenous vein ablations in 2010 and 2016. 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 She presents today with an open venous ulcer on her left lower leg. It has been present for a little over 2 weeks. She saw her primary care provider who apparently swabbed the wound and grew out Pseudomonas. She just completed a course of ciprofloxacin  for this. ABI was 0.91. She reports that she has had previous issues with ulcers in this same location, stemming back to a punch biopsy taken by a dermatologist many years ago. She has had several skin substitutes applied to the area that have ultimately resulted in healing on prior occasions. She has 2 small ulcers on her left medial lower leg. There is slough accumulation in both of them. The more anterior of the 2 is quite small and has some soft slough on the surface, underneath which there is good granulation tissue. The more medial wound also has slough accumulation, but the underlying  surface is fairly fibrotic and gritty. This is consistent with  her provided history of multiple ulcers in the same location. 08/03/2022: The anterior wound is smaller today with just a little bit of slough accumulation. The more medial wound continues to be very sensitive and fibrotic with slough buildup. 08/13/2022: The anterior wound is closed. The more medial wound remains sensitive with a fairly fibrotic surface. There is more granulation tissue filling in, however, and there is less slough than on prior occasions. 08/20/2022: The anterior wound remains closed. The more medial wound has filled with granulation tissue but still has a fair amount of slough on the surface and remains fairly tender. Unfortunately, she has developed 2 small ulcers just proximal to this. The fat layer is exposed in each. She says that over the weekend, she felt a burning sensation in that location. 08/27/2022: The 2 small wounds proximal to the main wound have merged into a single site. The wounds look a little bit dry, but they are quite clean without significant slough accumulation. They remain quite tender. 09/03/2022: All of the wounds have now merged into 1 large wound. There is a strong odor coming from the wound and the surface is not particularly viable. It is extremely painful for her today. 09/10/2022: The wound is less black and purple this week but still does not look particularly viable. The surface is desiccated. The culture that I took last week returned with a polymicrobial population including Pseudomonas. I prescribed both Augmentin and levofloxacin . She has not yet picked up levofloxacin , as her pharmacy only notified her yesterday that it was ready. We have ordered a Keystone topical compounded antibiotic, but it has not yet come. 09/17/2022: The wound surface is markedly improved today. There is still an area of grayish-looking muscle but the rest appears significantly more viable. There is still a  layer of slough on the surface. She still has a couple days left of oral antibiotic therapy. She has her Patsy compounded topical antibiotic with her today. 09/24/2022: She has completed her oral antibiotic therapy. The wound surface is much cleaner today and more viable without any necrotic tissue. It is a bit JEARLEAN, DEMAURO ([id number]) [id number] Page 8 of 12 desiccated, however. 10/01/2022: The moisture balance of the wound has improved. There is a layer of yellow slough on the surface, but beneath this, the wound is more pink. 10/08/2022: The wound is smaller and cleaner today with a layer of slough present. She is having less pain. 12/18; this patient has a new wound on the right lateral ankle which apparently started after a cat scratched her. Secondarily she managed to drop a cake pan on the same area. This is very painful. The original wound was on the left posterior calf we have been using Keystone Prisma under an Foot locker. The Unna boot fell down and apparently she was in for a nurse visit perhaps last week and that 1 fell down as well This patient has central venous mediated hypertension. For member correctly she has a stent placed in the left common iliac artery by Dr. Sheree some years ago [facility] Thurner syndrome] she also has had venous ablations and I believe a history of a DVT The patient has compression pumps at home but she does not use them 10/30/2022: The patient says that her wounds burned all week with the Hydrofera Blue classic in place. She has a fair amount of slough and nonviable subcutaneous tissue present on the right ankle. The left posterior calf wound is cleaner than I have seen it to date.  Both remain fairly tender. 11/14/2022: Both wounds have substantial slough and eschar accumulation. They remain extremely tender. 11/21/2022: The wounds are cleaner this week, but still have slough and eschar accumulation. She continues to describe burning  pain in both wounds, but upon further questioning, she actually has burning in both of her feet that radiates up her legs and includes to the wounds. 11/28/2022: Both wounds have slough and eschar accumulation, as well as adherent silver alginate that was difficult to remove. She continues to have pain out of proportion to the extent of her wounds. Her PCP did initiate Lyrica  which she started taking last night. The dose is quite low, only 25 mg, but apparently there is a plan for upward titration, assuming she tolerates the medication. 12/05/2022: The wounds are both a little bit smaller today, but have significant slough accumulation, as usual. They remain exquisitely tender. She is now taking Lyrica  twice a day. 12/19/2022: Both of her wounds look significantly better this week. There is slough buildup on both, but they are smaller. She seems to be getting a good response from Lyrica  as she is having much less pain. 12/26/2022: The wound on her right lateral ankle is nearly closed. The wound on her left posterior calf is smaller but still has a lot of slough accumulation. They remain tender. 01/02/2023: The right lateral ankle wound is down to just a couple of millimeters. It is much less tender than on prior occasions. There is minimal slough buildup. The wound on her posterior calf continues to contract, as well. It has thick slough buildup and remains fairly tender. 01/09/2023: The right lateral ankle wound is nearly closed. He remains tender. There is just a little eschar on the surface. The wound on her posterior calf has improved quite a bit. There is still slough on the surface, but the more proximal area has epithelialized substantially and there is no longer any gray discoloration to her tissue. 3/13; patient presents for follow-up. Her right lateral ankle wound is almost healed. She has a wound to her left posterior calf with slough and granulation tissue. We have been using Santyl under Unna  boots bilaterally to the lower extremities. She has no issues or complaints today. 01/30/2023: The right lateral ankle wound is down to just a pinhole under a layer of eschar. The left posterior calf wound is quite a bit improved since the last time I saw it. There is still a layer of slough on the surface but there has been more epithelialization. 02/07/2023: The right lateral ankle wound still has some eschar on the surface. The left posterior calf wound is about the same size, but with more areas of epithelialization. 02/20/2023: The right lateral ankle wound is healed. The left posterior calf wound is smaller and is essentially flush with the surrounding skin, but the surface is quite dry. Her leg wrap slipped and her calf is quite swollen above the level of the wound. 03/01/2023: The right lateral ankle wound reopened and she also suffered a fall that resulted in a small wound on her right anterior tibial surface. The left posterior calf wound has a much better moisture balance today. There is a layer of slough on the surface. The Foot locker worked much better for her as far as compression this week. 03/06/2023: The right lateral ankle wound has a layer of eschar on the surface and is too painful for me to debride. The anterior tibial surface wound is more or less healed; it is a  very superficial abrasion at this point. The left posterior calf wound has a layer of slough on the surface. Her edema control is better. 03/13/2023: The right lateral ankle wound has a layer of eschar on the surface. Once this was debrided, it was found to be healed. The anterior tibial surface wound has a layer of stuck silver alginate on the surface. This was removed to reveal a very superficial abrasion but not much change from last week. The left posterior calf wound is smaller and has its usual layer of accumulated slough. 03/25/2023: The anterior tibial surface wound has eschar and silver alginate stuck to it. Once this was  removed, the wound was found to be healed. The left posterior calf wound is roughly the same size and has a little bit thicker layer of accumulated slough. 04/03/2023: She has had more drainage from the posterior calf wound over the last week and the layer of slough is thicker. She also says it is more tender. 04/10/2023: Continued increase in drainage from her wound. The wound remains tender with a layer of slough on the surface. It does not appear as viable this week. She also has erythema, swelling, and warmth in her entire left lower leg extending into the midfoot. 04/17/2023: The wound is about the same size this week. There is fibrotic adherent slough, but the drainage is not as profuse. She is currently taking levofloxacin . She is complaining of an increase in the burning sensation at her wound. 04/24/2023: The wound measured smaller this week and is much cleaner-looking. She completed her course of levofloxacin . Her PCP increased her dose of Lyrica  and she reports that the burning has stopped. 05/01/2023: The wound is smaller again today and quite clean with only minimal slough on the surface. 05/08/2023: The wound is smaller again today. It is fairly clean, but the surface is a bit fibrotic. Moisture balance is better. Her leg is more swollen and erythematous today, however. 05/15/2023: The wound is about the same size today. The surface has very little slough accumulation, but remains fibrotic. Moisture balance is good. She is responding nicely to the oral Keflex  she is taking and her leg is less erythematous and edematous. 05/22/2023: The wound is unchanged in size. There is quite a bit of slough on the surface but the moisture balance is better. It is still fibrotic, but a little bit less so. Her leg is no longer erythematous, but she does have some persistent edema. [name], [name] ([id number]) [id number] Page 9 of 12 05/29/2023: The wound measured a little bit smaller  today. It is dry again and the surface is quite fibrotic. The edema in her leg has improved. 06/12/2023: The wound measures slightly larger today. The moisture balance has improved markedly. There is soft slough on the wound surface. Edema control is improved. 06/19/2023: The wound measured slightly smaller today. There is a band of epithelium dividing the most proximal portion of the wound from the remainder. There is slough accumulation and moisture balance is improved. 06/26/2023: The band of epithelium between the 2 open areas has gotten wider. There is some slough accumulation on the surfaces along with a little bit of eschar. Moisture balance continues to be good. 07/03/2023: The band of epithelium continues to expand. The moisture balance remains good and the surface, particularly of the more distal aspect of the wound continues to improve. Slough and eschar accumulation, as usual. 07/10/2023: The wound measured smaller today. Moisture balance is good and the tissue underneath the layer  of slough that has accumulated continues to improve. 07/17/2023: The wound is smaller again today. Moisture balance is appropriate. There is a little bit of slough overlying granulation tissue. 07/24/2023: The band of epithelium between the 2 open areas has gotten wider. The more proximal area has more epithelialization around the perimeter. Both have slough on the surface and remain fairly fibrotic. Moisture balance remains good. 08/07/2023: Both open areas are little bit smaller today. The band of epithelium between the 2 continues to enlarge. There is slough and eschar on both surfaces. 08/14/2023: No significant change to the wound dimensions. The surface is rather dry and fibrotic again. 08/21/2023: The wound is smaller and the skin bridge between the 2 open areas has gotten wider. There is slough accumulation on the surfaces and the moisture balance is improved. 08/28/2023: Both open areas of the wound are smaller  today. There is slough on the surface, but it is quite soft. Moisture balance is good. 09/04/2023: The band of epithelium between the 2 open areas continues to widen. The wound is very sensitive today, more so than usual. No concern for infection based upon visual inspection, however. There is some slough on the wound surface, which remains fairly fibrotic. 09/11/2023: Both open areas are little bit smaller today. There is slough accumulation on the surface. The underlying tissue is still somewhat fibrotic, but the color is good. Edema control is good. 09/17/2023: The wound continues to improve. The open areas are smaller and the band of skin has gotten wider. The tissue color is possibly the best that I have ever seen it although it does remain somewhat fibrotic. There is slough on the wound surface. 09/25/2023: The wound is smaller again today. The slough on the surface is rubbery but the underlying tissue continues to improve, quality-wise. It is less fibrotic today. Edema control is good. 10/01/2023: The wound continues to contract. There is slough on the surface. The underlying tissue continues to be less fibrotic from week to week. Unfortunately, she has reopened an area on her right posterior lower leg. It extends to the fat layer and there is slough accumulation on the surface. This occurred despite Unna boot compression. 10/23/2023: The right leg wound has healed. The wound on her left leg has contracted further. The band of skin between the 2 open areas has widened. There is some slough accumulation on the surface. 11/07/2023: The more proximal open portion of her wound is almost completely closed. There is a little bit of eschar on the surface. The distal aspect is smaller and the tissue continues to improve in quality. There is thick slough accumulation. Her wrap slid down and edema control is inadequate. 11/13/2023: The more proximal open portion of her wound is even smaller today, but  remains open underneath some eschar. The distal aspect continues to contract and epithelialize. There is slough on the surface. She has a new area on her medial right lower leg that is draining clear fluid. There is no obvious wound but clearly there has been a violation of the skin. Objective Constitutional Hypertensive, asymptomatic. no acute distress. Vitals Time Taken: 2:32 AM, Height: 63 in, Weight: 200 lbs, BMI: 35.4, Temperature: 97.6 F, Pulse: 75 bpm, Respiratory Rate: 18 breaths/min, Blood Pressure: 172/84 mmHg. Respiratory Normal work of breathing on room air.. General Notes: 11/13/2023: The more proximal open portion of her wound is even smaller today, but remains open underneath some eschar. The distal aspect continues to contract and epithelialize. There is slough on the surface.  She has a new area on her medial right lower leg that is draining clear fluid. There is no obvious wound but clearly there has been a violation of the skin. Integumentary (Hair, Skin) Wound #2 status is Open. Original cause of wound was Blister. The date acquired was: 07/06/2022. The wound has been in treatment 67 weeks. The wound is located on the Left,Posterior Lower Leg. The wound measures 1.6cm length x 1.3cm width x 0.1cm depth; 1.634cm^2 area and 0.163cm^3 volume. There is Fat Layer (Subcutaneous Tissue) exposed. There is no tunneling or undermining noted. There is a medium amount of serosanguineous drainage noted. The wound margin is distinct with the outline attached to the wound base. There is small (1-33%) red granulation within the wound bed. There is a large (67-100%) amount of necrotic tissue within the wound bed including Adherent Slough. The periwound skin appearance had no abnormalities noted for texture. The periwound skin appearance had no abnormalities noted for moisture. The periwound skin appearance exhibited: Hemosiderin Staining. Periwound temperature was noted as No Abnormality. JHADA, RISK ([id number]) [id number] Page 10 of 12 Wound #7 status is Open. Original cause of wound was Blister. The date acquired was: 11/13/2023. The wound is located on the Right,Medial Lower Leg. The wound measures 1cm length x 1cm width x 0.1cm depth; 0.785cm^2 area and 0.079cm^3 volume. There is Fat Layer (Subcutaneous Tissue) exposed. There is no tunneling or undermining noted. There is a large amount of serosanguineous drainage noted. There is large (67-100%) pink, pale granulation within the wound bed. There is a small (1-33%) amount of necrotic tissue within the wound bed including Adherent Slough. Assessment Active Problems ICD-10 Non-pressure chronic ulcer of other part of left lower leg with fat layer exposed Localized edema Varicose veins of bilateral lower extremities with other complications Venous insufficiency (chronic) (peripheral) Polyneuropathy, unspecified Procedures Wound #2 Pre-procedure diagnosis of Wound #2 is a Venous Leg Ulcer located on the Left,Posterior Lower Leg .Severity of Tissue Pre Debridement is: Fat layer exposed. There was a Excisional Skin/Subcutaneous Tissue Debridement with a total area of 1.63 sq cm performed by [name] Nest, MD. With the following instrument(s): Curette to remove Non-Viable tissue/material. Material removed includes Eschar, Subcutaneous Tissue, and Slough after achieving pain control using Lidocaine  4% T opical Solution. No specimens were taken. A time out was conducted at 14:45, prior to the start of the procedure. A Minimum amount of bleeding was controlled with Pressure. The procedure was tolerated well. Post Debridement Measurements: 1.6cm length x 1.3cm width x 0.1cm depth; 0.163cm^3 volume. Character of Wound/Ulcer Post Debridement is improved. Severity of Tissue Post Debridement is: Fat layer exposed. Post procedure Diagnosis Wound #2: Same as Pre-Procedure Pre-procedure diagnosis of Wound #2 is a  Venous Leg Ulcer located on the Left,Posterior Lower Leg . There was a Three Layer Compression Therapy Procedure by Cammie Sailors, RN. Post procedure Diagnosis Wound #2: Same as Pre-Procedure Wound #7 Pre-procedure diagnosis of Wound #7 is a Venous Leg Ulcer located on the Right,Medial Lower Leg . There was a Three Layer Compression Therapy Procedure by Cammie Sailors, RN. Post procedure Diagnosis Wound #7: Same as Pre-Procedure Plan Follow-up Appointments: Return Appointment in 1 week. - Dr. [name] 1/14 @ 2:45 pm Bathing/ Shower/ Hygiene: May shower with protection but do not get wound dressing(s) wet. Protect dressing(s) with water repellant cover (for example, large plastic bag) or a cast cover and may then take shower. Additional Orders / Instructions: Other: - Recommend Nail Care Lymphedema Treatment Plan - Exercise, Compression  and Elevation: Exercise daily as tolerated. (Walking, ROM, Calf Pumps and T T oe aps) Compression Garments, Class II. Wear daily when out of bed. May remove at bedtime - right leg Compression Wraps as ordered - left leg Elevate legs 30 - 60 minutes at or above heart level at least 3 - 4 times daily as able/tolerated Avoid standing for long periods and elevate leg(s) parallel to the floor when sitting Use Pneumatic Compression Device on leg(s) 2-3 times a day for 45-60 minutes The following medication(s) was prescribed: lidocaine  topical 4 % cream cream topical once daily was prescribed at facility WOUND #2: - Lower Leg Wound Laterality: Left, Posterior Cleanser: Soap and Water 1 x Per Week/30 Days Discharge Instructions: May shower and wash wound with dial antibacterial soap and water prior to dressing change. Peri-Wound Care: Zinc  Oxide Ointment 30g tube 1 x Per Week/30 Days Discharge Instructions: Apply Zinc  Oxide to periwound with each dressing change Peri-Wound Care: Sween Lotion (Moisturizing lotion) 1 x Per Week/30 Days Discharge Instructions: Apply  moisturizing lotion as directed Topical: Gentamicin 1 x Per Week/30 Days Discharge Instructions: As directed by physician Topical: Mupirocin Ointment 1 x Per Week/30 Days Discharge Instructions: Apply Mupirocin (Bactroban) as instructed Topical: Skintegrity Hydrogel 4 (oz) 1 x Per Week/30 Days Discharge Instructions: Apply hydrogel as directed Prim Dressing: Promogran Prisma Matrix, 4.34 (sq in) (silver collagen) 1 x Per Week/30 Days ary Discharge Instructions: Moisten collagen with saline or hydrogel Secondary Dressing: Optifoam Non-Adhesive Dressing, 4x4 in 1 x Per Week/30 Days Discharge Instructions: Apply over primary dressing as directed. Com pression Wrap: CoFlex Calamine Unna Boot 4 x 6 (in/yd) (DME) (Dispense As Written) 1 x Per Week/30 Days BERNECE, GALL ([id number]) [phone].pdf Page 11 of 12 Discharge Instructions: Apply Coflex [employer]amine [employer] as directed. WOUND #7: - Lower Leg Wound Laterality: Right, Medial Cleanser: Soap and Water 1 x Per Week/30 Days Discharge Instructions: May shower and wash wound with dial antibacterial soap and water prior to dressing change. Peri-Wound Care: Sween Lotion (Moisturizing lotion) 1 x Per Week/30 Days Discharge Instructions: Apply moisturizing lotion as directed Prim Dressing: Maxorb Extra Ag+ Alginate Dressing, 4x4.75 (in/in) 1 x Per Week/30 Days ary Discharge Instructions: Apply to wound bed as instructed Secondary Dressing: ABD Pad, 8x10 1 x Per Week/30 Days Discharge Instructions: Apply over primary dressing as directed. Com pression Wrap: CoFlex Calamine Unna Boot 4 x 6 (in/yd) (DME) (Dispense As Written) 1 x Per Week/30 Days Discharge Instructions: Apply Coflex [employer] as directed. 11/13/2023: The more proximal open portion of her wound is even smaller today, but remains open underneath some eschar. The distal aspect continues to contract and epithelialize. There is slough on the surface. She  has a new area on her medial right lower leg that is draining clear fluid. There is no obvious wound but clearly there has been a violation of the skin. I used a curette to debride eschar, slough, and subcutaneous tissue from her leg wound. We will continue the mixture of topical gentamicin and mupirocin with Prisma silver collagen moistened with hydrogel and an Optifoam cover to maintain good moisture balance. We normally use a calamine Coflex wrap, but we do not have any in clinic today. We will use the first layer of Unna boot and an Urgo lite wrap in its stead. On the right leg, we will apply some silver alginate and ABD pads over the area of drainage and apply a similar compression wrap. She will follow-up in 1 week. Electronic  Signature(s) Signed: 11/14/2023 4:46:16 PM By: Boehlein, Cuca RN, [name] Signed: 11/15/2023 8:14:31 AM By: [name] Nest MD FACS Previous Signature: 11/13/2023 3:20:31 PM Version By: [name] Nest MD FACS Entered By: Merleen Handing on 11/14/2023 15:46:19 -------------------------------------------------------------------------------- SuperBill Details Patient Name: Date of Service: CATHERYNE, DEFORD 11/13/2023 Medical Record Number: [id number] Patient Account Number: [id number] Date of Birth/Sex: Treating RN: June 30, 1946 (78 y.o. [name] Primary Care Provider: Rilla Baller Other Clinician: Referring Provider: Treating Provider/Extender: [name] Nest Rilla Baller [name]: 67 Diagnosis Coding ICD-10 Codes Code Description [phone] Non-pressure chronic ulcer of other part of left lower leg with fat layer exposed R60.0 Localized edema I83.893 Varicose veins of bilateral lower extremities with other complications I87.2 Venous insufficiency (chronic) (peripheral) G62.9 Polyneuropathy, unspecified Facility Procedures : CPT4 Code: [id number] Description: 11042 - DEB SUBQ TISSUE 20 SQ CM/< ICD-10 Diagnosis Description L97.822 Non-pressure  chronic ulcer of other part of left lower leg with fat layer exposed Modifier: Quantity: 1 : CPT4 Code: [id number] Description: (Facility Use Only) [id number] - APPLY MULTLAY COMPRS LWR RT LEG Modifier: Quantity: 1 Physician Procedures : CPT4 Code Description Modifier [id number] 99214 - WC PHYS LEVEL 4 - EST PT ICD-10 Diagnosis Description L97.822 Non-pressure chronic ulcer of other part of left lower leg with fat layer exposed R60.0 Localized edema I83.893 Varicose veins of bilateral  lower extremities with other complications GWENN, TEODORO ([id number]) [id number] I87.2 Venous insufficiency (chronic) (peripheral) Quantity: 1 Page 12 of 12 : [id number] 11042 - WC PHYS SUBQ TISS 20 SQ CM 1 ICD-10 Diagnosis Description L97.822 Non-pressure chronic ulcer of other part of left lower leg with fat layer exposed Quantity: Electronic Signature(s) Signed: 11/13/2023 3:21:51 PM By: [name] Nest MD FACS Previous Signature: 11/13/2023 3:11:11 PM Version By: Cammie Sailors RN, [name] Entered By: [name] Nest on 11/13/2023 15:21:51

## 2023-11-14 ENCOUNTER — Encounter (HOSPITAL_BASED_OUTPATIENT_CLINIC_OR_DEPARTMENT_OTHER): Payer: Medicare Other | Admitting: General Surgery

## 2023-11-14 DIAGNOSIS — I872 Venous insufficiency (chronic) (peripheral): Secondary | ICD-10-CM | POA: Diagnosis not present

## 2023-11-14 DIAGNOSIS — L97822 Non-pressure chronic ulcer of other part of left lower leg with fat layer exposed: Secondary | ICD-10-CM | POA: Diagnosis not present

## 2023-11-14 DIAGNOSIS — G629 Polyneuropathy, unspecified: Secondary | ICD-10-CM | POA: Diagnosis not present

## 2023-11-14 DIAGNOSIS — R600 Localized edema: Secondary | ICD-10-CM | POA: Diagnosis not present

## 2023-11-14 DIAGNOSIS — I83893 Varicose veins of bilateral lower extremities with other complications: Secondary | ICD-10-CM | POA: Diagnosis not present

## 2023-11-14 NOTE — Progress Notes (Signed)
 Amber, [name] ([id number]) [id number] Page 1 of 3 Visit Report for 11/14/2023 Arrival Information Details Patient Name: Date of Service: [name], [name] 11/14/2023 3:15 PM Medical Record Number: [id number] Patient Account Number: [id number] Date of Birth/Sex: Treating RN: May 29, 1946 (78 y.o. [name] Aver, Morena Primary Care Katharine Rochefort: Rilla Baller Other Clinician: Referring Dontay Harm: Treating Kaoir Loree/Extender: [name] Delon Rilla Baller [name]: 61 Visit Information History Since Last Visit Added or deleted any medications: No Patient Arrived: Ambulatory Any new allergies or adverse reactions: No Arrival Time: 16:01 Had a fall or experienced change in No Accompanied By: self activities of daily living that may affect Transfer Assistance: None risk of falls: Patient Identification Verified: Yes Signs or symptoms of abuse/neglect since last visito No Secondary Verification Process Completed: Yes Hospitalized since last visit: No Patient Requires Transmission-Based Precautions: No Implantable device outside of the clinic excluding No Patient Has Alerts: No cellular tissue based products placed in the center since last visit: Has Dressing in Place as Prescribed: Yes Has Compression in Place as Prescribed: Yes Pain Present Now: No Electronic Signature(s) Signed: 11/14/2023 4:46:16 PM By: Aver Rock RN, [name] Entered By: Boehlein, Alailah on 11/14/2023 13:01:41 -------------------------------------------------------------------------------- Compression Therapy Details Patient Name: Date of Service: Amber, [name] 11/14/2023 3:15 PM Medical Record Number: [id number] Patient Account Number: [id number] Date of Birth/Sex: Treating RN: Nov 30, 1945 (78 y.o. [name] Aver Rock Primary Care Shalen Petrak: Rilla Baller Other Clinician: Referring Lela Murfin: Treating Ezzard Ditmer/Extender: [name] Delon Rilla Baller [name]:  83 Compression Therapy Performed for Wound Assessment: Wound #2 Left,Posterior Lower Leg Performed By: Clinician Aver Rock, RN Compression Type: Double Layer Notes zinc  coflex Electronic Signature(s) Signed: 11/14/2023 4:46:16 PM By: Aver Rock RN, [name] Entered By: Aver Rock on 11/14/2023 13:02:57 ZULEMA ROCK PARAS ([id number]) [id number] Page 2 of 3 -------------------------------------------------------------------------------- Encounter Discharge Information Details Patient Name: Date of Service: Amber, [name] 11/14/2023 3:15 PM Medical Record Number: [id number] Patient Account Number: [id number] Date of Birth/Sex: Treating RN: 1946/04/30 (78 y.o. [name] Aver Rock Primary Care Konnor Vondrasek: Rilla Baller Other Clinician: Referring Chrystie Hagwood: Treating Feliciano Wynter/Extender: [name] Delon Rilla Baller [name]: 48 Encounter Discharge Information Items Discharge Condition: Stable Ambulatory Status: Ambulatory Discharge Destination: Home Transportation: Private Auto Accompanied By: self Schedule Follow-up Appointment: Yes Clinical Summary of Care: Patient Declined Electronic Signature(s) Signed: 11/14/2023 4:46:16 PM By: Aver Rock RN, [name] Entered By: Aver Rock on 11/14/2023 13:04:06 -------------------------------------------------------------------------------- Patient/Caregiver Education Details Patient Name: Date of Service: ZULEMA ROCK ALF 1/9/2025andnbsp3:15 PM Medical Record Number: [id number] Patient Account Number: [id number] Date of Birth/Gender: Treating RN: 21-Nov-1945 (78 y.o. [name] Aver Rock Primary Care Physician: Rilla Baller Other Clinician: Referring Physician: Treating Physician/Extender: [name] Delon Rilla Baller [name]: 15 Education Assessment Education Provided To: Patient Education Topics Provided Venous: Methods: Explain/Verbal Responses: Reinforcements  needed, State content correctly Electronic Signature(s) Signed: 11/14/2023 4:46:16 PM By: Aver Rock RN, [name] Entered By: Aver Rock on 11/14/2023 13:03:25 -------------------------------------------------------------------------------- Wound Assessment Details Patient Name: Date of Service: Amber, [name] 11/14/2023 3:15 PM Medical Record Number: [id number] Patient Account Number: [id number] Date of Birth/Sex: Treating RN: [birth year] (78 y.o. [name] Aver Rock Primary Care Keghan Mcfarren: Rilla Baller Other Clinician: Referring Talishia Betzler: Treating Elpidia Karn/Extender: [name] Delon Rilla Baller Weeks in Treatment: [address] KATHRENE, SINOPOLI ([id number]) [id number] Page 3 of 3 Wound Number: 2 Primary Venous Leg Ulcer Etiology: Wound Location: Left, Posterior Lower Leg Wound Open Wounding Event: Blister Status: Date Acquired: 07/06/2022 Comorbid Cataracts, Asthma, Hypertension, Neuropathy, Received Weeks Of Treatment: 67 History: Chemotherapy, Received Radiation Clustered Wound: Yes  Wound Measurements Length: (cm) Width: (cm) Depth: (cm) Clustered Quantity: Area: (cm) Volume: (cm) 1.6 % Reduction in Area: 76.2% 1.3 % Reduction in Volume: 76.3% 0.1 Epithelialization: Small (1-33%) 2 Tunneling: No 1.634 Undermining: No 0.163 Wound Description Classification: Full Thickness Without Exposed Sup Wound Margin: Distinct, outline attached Exudate Amount: Medium Exudate Type: Serosanguineous Exudate Color: red, brown port Structures Foul Odor After Cleansing: No Slough/Fibrino Yes Wound Bed Granulation Amount: Large (67-100%) Exposed Structure Granulation Quality: Pink Fascia Exposed: No Necrotic Amount: None Present (0%) Fat Layer (Subcutaneous Tissue) Exposed: Yes Tendon Exposed: No Muscle Exposed: No Joint Exposed: No Bone Exposed: No Periwound Skin Texture Texture Color No Abnormalities Noted: Yes No Abnormalities Noted:  No Hemosiderin Staining: Yes Moisture No Abnormalities Noted: Yes Temperature / Pain Temperature: No Abnormality Electronic Signature(s) Signed: 11/14/2023 4:46:16 PM By: Merleen Handing RN, [name] Entered By: Boehlein, Breeanna on 11/14/2023 13:02:25

## 2023-11-15 NOTE — Progress Notes (Signed)
 CYTHINA, MICKELSEN ([id number]) [id number] Page 1 of 1 Visit Report for 11/14/2023 SuperBill Details Patient Name: Date of Service: [name], [name] 11/14/2023 Medical Record Number: [id number] Patient Account Number: [id number] Date of Birth/Sex: Treating RN: [birth year] (78 y.o. [name] Merleen Rock Primary Care Provider: Rilla Baller Other Clinician: Referring Provider: Treating Provider/Extender: [name] Delon Rilla Baller [name]: 67 Diagnosis Coding ICD-10 Codes Code Description [phone] Non-pressure chronic ulcer of other part of left lower leg with fat layer exposed R60.0 Localized edema I83.893 Varicose veins of bilateral lower extremities with other complications I87.2 Venous insufficiency (chronic) (peripheral) G62.9 Polyneuropathy, unspecified Facility Procedures CPT4 Code Description Modifier Quantity [phone] (Facility Use Only) [phone] - APPLY MULTLAY COMPRS LWR LT LEG 1 Electronic Signature(s) Signed: 11/14/2023 4:46:16 PM By: Merleen Rock RN, [name] Signed: 11/15/2023 8:14:31 AM By: [name] Delon MD FACS Entered By: Merleen Rock on 11/14/2023 16:04:17

## 2023-11-19 ENCOUNTER — Encounter (HOSPITAL_BASED_OUTPATIENT_CLINIC_OR_DEPARTMENT_OTHER): Payer: Medicare Other | Admitting: General Surgery

## 2023-11-19 DIAGNOSIS — I83893 Varicose veins of bilateral lower extremities with other complications: Secondary | ICD-10-CM | POA: Diagnosis not present

## 2023-11-19 DIAGNOSIS — L97822 Non-pressure chronic ulcer of other part of left lower leg with fat layer exposed: Secondary | ICD-10-CM | POA: Diagnosis not present

## 2023-11-19 DIAGNOSIS — R600 Localized edema: Secondary | ICD-10-CM | POA: Diagnosis not present

## 2023-11-19 DIAGNOSIS — I872 Venous insufficiency (chronic) (peripheral): Secondary | ICD-10-CM | POA: Diagnosis not present

## 2023-11-19 DIAGNOSIS — L97222 Non-pressure chronic ulcer of left calf with fat layer exposed: Secondary | ICD-10-CM | POA: Diagnosis not present

## 2023-11-19 DIAGNOSIS — G629 Polyneuropathy, unspecified: Secondary | ICD-10-CM | POA: Diagnosis not present

## 2023-11-21 NOTE — Progress Notes (Signed)
 [name] ([id number]) [id number] Page 1 of 11 Visit Report for 11/19/2023 Chief Complaint Document Details Patient Name: Date of Service: [name], [name] 11/19/2023 2:45 PM Medical Record Number: [id number] Patient Account Number: [id number] Date of Birth/Sex: Treating RN: 05-06-1946 (78 y.o. F) Primary Care Provider: Rilla [name] Other Clinician: Referring Provider: Treating Provider/Extender: [name] [name] [name] in Treatment: 78 Information Obtained from: Patient Chief Complaint Patient presents for treatment of an open ulcer due to venous insufficiency Electronic Signature(s) Signed: 11/19/2023 3:42:17 PM By: Amber Delon MD FACS Entered By: [name] on 11/19/2023 15:42:17 -------------------------------------------------------------------------------- Debridement Details Patient Name: Date of Service: [name] 11/19/2023 2:45 PM Medical Record Number: [id number] Patient Account Number: [id number] Date of Birth/Sex: Treating RN: 01-Jun-1946 (78 y.o. [name] [name] Primary Care Provider: Rilla [name] Other Clinician: Referring Provider: Treating Provider/Extender: [name] [name] [name] in Treatment: 78 Debridement Performed for Assessment: Wound #2 Left,Posterior Lower Leg Performed By: Physician Amber Delon, MD The following information was scribed by: [name] The information was scribed for: [name] Debridement Type: Debridement Severity of Tissue Pre Debridement: Fat layer exposed Level of Consciousness (Pre-procedure): Awake and Alert Pre-procedure Verification/Time Out Yes - 15:35 Taken: Start Time: 15:35 Pain Control: Lidocaine  4% T opical Solution Percent of Wound Bed Debrided: 100% T Area Debrided (cm): otal 1.65 Tissue and other material debrided: Viable, Non-Viable, Eschar, Slough, Subcutaneous, Slough Level: Skin/Subcutaneous  Tissue Debridement Description: Excisional Instrument: Curette Bleeding: Minimum Hemostasis Achieved: Pressure Procedural Pain: 4 Post Procedural Pain: 3 Response to Treatment: Procedure was tolerated well Level of Consciousness (Post- Awake and Alert procedure): Post Debridement Measurements of Total Wound Length: (cm) 1.5 Width: (cm) 1.4 Depth: (cm) 0.1 Volume: (cm) 0.165 [name], [name] ([id number]) [id number] Page 2 of 11 Character of Wound/Ulcer Post Debridement: Improved Severity of Tissue Post Debridement: Fat layer exposed Post Procedure Diagnosis Same as Pre-procedure Electronic Signature(s) Signed: 11/19/2023 3:48:34 PM By: Amber Delon MD FACS Signed: 11/19/2023 5:09:31 PM By: Amber Rock RN, [name] Entered By: [name] on 11/19/2023 15:37:59 -------------------------------------------------------------------------------- HPI Details Patient Name: Date of Service: [name], [name] 11/19/2023 2:45 PM Medical Record Number: [id number] Patient Account Number: [id number] Date of Birth/Sex: Treating RN: 1946/03/01 (78 y.o. F) Primary Care Provider: Rilla [name] Other Clinician: Referring Provider: Treating Provider/Extender: [name] [name] [name] in Treatment: 18 History of Present Illness HPI Description: ADMISSION This is a 78 year old woman with a history of chronic venous insufficiency status post saphenous vein ablations in 2010 and 2016. 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 She presents today with an open venous ulcer on her left lower leg. It has been present for a little over 2 weeks. She saw her primary care provider who apparently swabbed the wound and grew out Pseudomonas. She just completed a course of ciprofloxacin  for this. ABI was 0.91. She reports that she has had previous issues with ulcers in this same location, stemming back to a punch biopsy taken by a  dermatologist many years ago. She has had several skin substitutes applied to the area that have ultimately resulted in healing on prior occasions. She has 2 small ulcers on her left medial lower leg. There is slough accumulation in both of them. The more anterior of the 2 is quite small and has some soft slough on the surface, underneath which there is good granulation tissue. The more medial wound also has slough accumulation, but the underlying surface is fairly fibrotic and gritty. This is consistent with  her provided history of multiple ulcers in the same location. 08/03/2022: The anterior wound is smaller today with just a little bit of slough accumulation. The more medial wound continues to be very sensitive and fibrotic with slough buildup. 08/13/2022: The anterior wound is closed. The more medial wound remains sensitive with a fairly fibrotic surface. There is more granulation tissue filling in, however, and there is less slough than on prior occasions. 08/20/2022: The anterior wound remains closed. The more medial wound has filled with granulation tissue but still has a fair amount of slough on the surface and remains fairly tender. Unfortunately, she has developed 2 small ulcers just proximal to this. The fat layer is exposed in each. She says that over the weekend, she felt a burning sensation in that location. 08/27/2022: The 2 small wounds proximal to the main wound have merged into a single site. The wounds look a little bit dry, but they are quite clean without significant slough accumulation. They remain quite tender. 09/03/2022: All of the wounds have now merged into 1 large wound. There is a strong odor coming from the wound and the surface is not particularly viable. It is extremely painful for her today. 09/10/2022: The wound is less black and purple this week but still does not look particularly viable. The surface is desiccated. The culture that I took last week returned with a  polymicrobial population including Pseudomonas. I prescribed both Augmentin and levofloxacin . She has not yet picked up levofloxacin , as her pharmacy only notified her yesterday that it was ready. We have ordered a Keystone topical compounded antibiotic, but it has not yet come. 09/17/2022: The wound surface is markedly improved today. There is still an area of grayish-looking muscle but the rest appears significantly more viable. There is still a layer of slough on the surface. She still has a couple days left of oral antibiotic therapy. She has her Patsy compounded topical antibiotic with her today. 09/24/2022: She has completed her oral antibiotic therapy. The wound surface is much cleaner today and more viable without any necrotic tissue. It is a bit desiccated, however. 10/01/2022: The moisture balance of the wound has improved. There is a layer of yellow slough on the surface, but beneath this, the wound is more pink. 10/08/2022: The wound is smaller and cleaner today with a layer of slough present. She is having less pain. 12/18; this patient has a new wound on the right lateral ankle which apparently started after a cat scratched her. Secondarily she managed to drop a cake pan on the same area. This is very painful. The original wound was on the left posterior calf we have been using Keystone Prisma under an Foot locker. The Unna boot fell down and apparently she was in for a nurse visit perhaps last week and that 1 fell down as well This patient has central venous mediated hypertension. For member correctly she has a stent placed in the left common iliac artery by Dr. Sheree some years ago [employer] Thurner syndrome] she also has had venous ablations and I believe a history of a DVT The patient has compression pumps at home but she does not use them ADALEENA, MOOERS ([id number]) [phone].pdf Page 3 of 11 10/30/2022: The patient says that her wounds burned all week with the  Hydrofera Blue classic in place. She has a fair amount of slough and nonviable subcutaneous tissue present on the right ankle. The left posterior calf wound is cleaner than I have seen it to  date. Both remain fairly tender. 11/14/2022: Both wounds have substantial slough and eschar accumulation. They remain extremely tender. 11/21/2022: The wounds are cleaner this week, but still have slough and eschar accumulation. She continues to describe burning pain in both wounds, but upon further questioning, she actually has burning in both of her feet that radiates up her legs and includes to the wounds. 11/28/2022: Both wounds have slough and eschar accumulation, as well as adherent silver alginate that was difficult to remove. She continues to have pain out of proportion to the extent of her wounds. Her PCP did initiate Lyrica  which she started taking last night. The dose is quite low, only 25 mg, but apparently there is a plan for upward titration, assuming she tolerates the medication. 12/05/2022: The wounds are both a little bit smaller today, but have significant slough accumulation, as usual. They remain exquisitely tender. She is now taking Lyrica  twice a day. 12/19/2022: Both of her wounds look significantly better this week. There is slough buildup on both, but they are smaller. She seems to be getting a good response from Lyrica  as she is having much less pain. 12/26/2022: The wound on her right lateral ankle is nearly closed. The wound on her left posterior calf is smaller but still has a lot of slough accumulation. They remain tender. 01/02/2023: The right lateral ankle wound is down to just a couple of millimeters. It is much less tender than on prior occasions. There is minimal slough buildup. The wound on her posterior calf continues to contract, as well. It has thick slough buildup and remains fairly tender. 01/09/2023: The right lateral ankle wound is nearly closed. He remains tender. There is just  a little eschar on the surface. The wound on her posterior calf has improved quite a bit. There is still slough on the surface, but the more proximal area has epithelialized substantially and there is no longer any gray discoloration to her tissue. 3/13; patient presents for follow-up. Her right lateral ankle wound is almost healed. She has a wound to her left posterior calf with slough and granulation tissue. We have been using Santyl under Unna boots bilaterally to the lower extremities. She has no issues or complaints today. 01/30/2023: The right lateral ankle wound is down to just a pinhole under a layer of eschar. The left posterior calf wound is quite a bit improved since the last time I saw it. There is still a layer of slough on the surface but there has been more epithelialization. 02/07/2023: The right lateral ankle wound still has some eschar on the surface. The left posterior calf wound is about the same size, but with more areas of epithelialization. 02/20/2023: The right lateral ankle wound is healed. The left posterior calf wound is smaller and is essentially flush with the surrounding skin, but the surface is quite dry. Her leg wrap slipped and her calf is quite swollen above the level of the wound. 03/01/2023: The right lateral ankle wound reopened and she also suffered a fall that resulted in a small wound on her right anterior tibial surface. The left posterior calf wound has a much better moisture balance today. There is a layer of slough on the surface. The Foot locker worked much better for her as far as compression this week. 03/06/2023: The right lateral ankle wound has a layer of eschar on the surface and is too painful for me to debride. The anterior tibial surface wound is more or less healed; it is a  very superficial abrasion at this point. The left posterior calf wound has a layer of slough on the surface. Her edema control is better. 03/13/2023: The right lateral ankle wound has a  layer of eschar on the surface. Once this was debrided, it was found to be healed. The anterior tibial surface wound has a layer of stuck silver alginate on the surface. This was removed to reveal a very superficial abrasion but not much change from last week. The left posterior calf wound is smaller and has its usual layer of accumulated slough. 03/25/2023: The anterior tibial surface wound has eschar and silver alginate stuck to it. Once this was removed, the wound was found to be healed. The left posterior calf wound is roughly the same size and has a little bit thicker layer of accumulated slough. 04/03/2023: She has had more drainage from the posterior calf wound over the last week and the layer of slough is thicker. She also says it is more tender. 04/10/2023: Continued increase in drainage from her wound. The wound remains tender with a layer of slough on the surface. It does not appear as viable this week. She also has erythema, swelling, and warmth in her entire left lower leg extending into the midfoot. 04/17/2023: The wound is about the same size this week. There is fibrotic adherent slough, but the drainage is not as profuse. She is currently taking levofloxacin . She is complaining of an increase in the burning sensation at her wound. 04/24/2023: The wound measured smaller this week and is much cleaner-looking. She completed her course of levofloxacin . Her PCP increased her dose of Lyrica  and she reports that the burning has stopped. 05/01/2023: The wound is smaller again today and quite clean with only minimal slough on the surface. 05/08/2023: The wound is smaller again today. It is fairly clean, but the surface is a bit fibrotic. Moisture balance is better. Her leg is more swollen and erythematous today, however. 05/15/2023: The wound is about the same size today. The surface has very little slough accumulation, but remains fibrotic. Moisture balance is good. She is responding nicely to the oral  Keflex  she is taking and her leg is less erythematous and edematous. 05/22/2023: The wound is unchanged in size. There is quite a bit of slough on the surface but the moisture balance is better. It is still fibrotic, but a little bit less so. Her leg is no longer erythematous, but she does have some persistent edema. 05/29/2023: The wound measured a little bit smaller today. It is dry again and the surface is quite fibrotic. The edema in her leg has improved. 06/12/2023: The wound measures slightly larger today. The moisture balance has improved markedly. There is soft slough on the wound surface. Edema control is improved. 06/19/2023: The wound measured slightly smaller today. There is a band of epithelium dividing the most proximal portion of the wound from the remainder. There is slough accumulation and moisture balance is improved. 06/26/2023: The band of epithelium between the 2 open areas has gotten wider. There is some slough accumulation on the surfaces along with a little bit of eschar. Moisture balance continues to be good. 07/03/2023: The band of epithelium continues to expand. The moisture balance remains good and the surface, particularly of the more distal aspect of the wound continues to improve. Slough and eschar accumulation, as usual. 07/10/2023: The wound measured smaller today. Moisture balance is good and the tissue underneath the layer of slough that has accumulated continues to improve. [name],  [name] ([id number]) [id number] Page 4 of 11 07/17/2023: The wound is smaller again today. Moisture balance is appropriate. There is a little bit of slough overlying granulation tissue. 07/24/2023: The band of epithelium between the 2 open areas has gotten wider. The more proximal area has more epithelialization around the perimeter. Both have slough on the surface and remain fairly fibrotic. Moisture balance remains good. 08/07/2023: Both open areas are little bit  smaller today. The band of epithelium between the 2 continues to enlarge. There is slough and eschar on both surfaces. 08/14/2023: No significant change to the wound dimensions. The surface is rather dry and fibrotic again. 08/21/2023: The wound is smaller and the skin bridge between the 2 open areas has gotten wider. There is slough accumulation on the surfaces and the moisture balance is improved. 08/28/2023: Both open areas of the wound are smaller today. There is slough on the surface, but it is quite soft. Moisture balance is good. 09/04/2023: The band of epithelium between the 2 open areas continues to widen. The wound is very sensitive today, more so than usual. No concern for infection based upon visual inspection, however. There is some slough on the wound surface, which remains fairly fibrotic. 09/11/2023: Both open areas are little bit smaller today. There is slough accumulation on the surface. The underlying tissue is still somewhat fibrotic, but the color is good. Edema control is good. 09/17/2023: The wound continues to improve. The open areas are smaller and the band of skin has gotten wider. The tissue color is possibly the best that I have ever seen it although it does remain somewhat fibrotic. There is slough on the wound surface. 09/25/2023: The wound is smaller again today. The slough on the surface is rubbery but the underlying tissue continues to improve, quality-wise. It is less fibrotic today. Edema control is good. 10/01/2023: The wound continues to contract. There is slough on the surface. The underlying tissue continues to be less fibrotic from week to week. Unfortunately, she has reopened an area on her right posterior lower leg. It extends to the fat layer and there is slough accumulation on the surface. This occurred despite Unna boot compression. 10/23/2023: The right leg wound has healed. The wound on her left leg has contracted further. The band of skin between the 2  open areas has widened. There is some slough accumulation on the surface. 11/07/2023: The more proximal open portion of her wound is almost completely closed. There is a little bit of eschar on the surface. The distal aspect is smaller and the tissue continues to improve in quality. There is thick slough accumulation. Her wrap slid down and edema control is inadequate. 11/13/2023: The more proximal open portion of her wound is even smaller today, but remains open underneath some eschar. The distal aspect continues to contract and epithelialize. There is slough on the surface. She has a new area on her medial right lower leg that is draining clear fluid. There is no obvious wound but clearly there has been a violation of the skin. 11/19/2023: Both areas of the left lower leg wound are little bit smaller today. There is a layer of slough on the surface and the underlying tissue remains fibrotic, but the color is better. Whatever was leaking on the right leg has sealed up again. Electronic Signature(s) Signed: 11/19/2023 3:43:08 PM By: Amber Nest MD FACS Entered By: Amber Nest on 11/19/2023 15:43:08 -------------------------------------------------------------------------------- Physical Exam Details Patient Name: Date of Service: [name] [name]. 11/19/2023 2:45  PM Medical Record Number: [id number] Patient Account Number: [id number] Date of Birth/Sex: Treating RN: [birth year] (78 y.o. F) Primary Care Provider: Rilla [name] Other Clinician: Referring Provider: Treating Provider/Extender: [name] [name] [name] in Treatment: 34 Constitutional Hypertensive, asymptomatic. . . . no acute distress. Respiratory Normal work of breathing on room air.. Notes 11/19/2023: Both areas of the left lower leg wound are little bit smaller today. There is a layer of slough on the surface and the underlying tissue remains fibrotic, but the color is better. Whatever was leaking on the  right leg has sealed up again. Electronic Signature(s) Signed: 11/19/2023 3:46:52 PM By: Amber Delon MD FACS Entered By: [name] on 11/19/2023 15:46:52 Welker, [name] ([id number]) [id number] Page 5 of 11 -------------------------------------------------------------------------------- Physician Orders Details Patient Name: Date of Service: [name], [name] 11/19/2023 2:45 PM Medical Record Number: [id number] Patient Account Number: [id number] Date of Birth/Sex: Treating RN: 04/20/1946 (78 y.o. [name] [name] Primary Care Provider: Rilla [name] Other Clinician: Referring Provider: Treating Provider/Extender: [name] [name] [name] in Treatment: 31 The following information was scribed by: [name] The information was scribed for: [name] Verbal / Phone Orders: No Diagnosis Coding ICD-10 Coding Code Description [phone] Non-pressure chronic ulcer of other part of left lower leg with fat layer exposed R60.0 Localized edema I83.893 Varicose veins of bilateral lower extremities with other complications I87.2 Venous insufficiency (chronic) (peripheral) G62.9 Polyneuropathy, unspecified Follow-up Appointments ppointment in 1 week. - Dr. [name] Return A 1/22 @ 2:45 pm Bathing/ Shower/ Hygiene May shower with protection but do not get wound dressing(s) wet. Protect dressing(s) with water repellant cover (for example, large plastic bag) or a cast cover and may then take shower. Additional Orders / Instructions Other: - Recommend Nail Care Lymphedema Treatment Plan - Exercise, Compression and Elevation Bilateral Lower Extremities Exercise daily as tolerated. (Walking, ROM, Calf Pumps and Toe Taps) Compression Garments, Class II. Wear daily when out of bed. May remove at bedtime - right leg Compression Wraps as ordered - left leg Elevate legs 30 - 60 minutes at or above heart level at least 3 - 4 times daily  as able/tolerated Avoid standing for long periods and elevate leg(s) parallel to the floor when sitting Use Pneumatic Compression Device on leg(s) 2-3 times a day for 45-60 minutes Wound Treatment Wound #2 - Lower Leg Wound Laterality: Left, Posterior Cleanser: Soap and Water 1 x Per Week/30 Days Discharge Instructions: May shower and wash wound with dial antibacterial soap and water prior to dressing change. Peri-Wound Care: Zinc  Oxide Ointment 30g tube 1 x Per Week/30 Days Discharge Instructions: Apply Zinc  Oxide to periwound with each dressing change Peri-Wound Care: Sween Lotion (Moisturizing lotion) 1 x Per Week/30 Days Discharge Instructions: Apply moisturizing lotion as directed Topical: Gentamicin 1 x Per Week/30 Days Discharge Instructions: As directed by physician Topical: Mupirocin Ointment 1 x Per Week/30 Days Discharge Instructions: Apply Mupirocin (Bactroban) as instructed Topical: Skintegrity Hydrogel 4 (oz) 1 x Per Week/30 Days Discharge Instructions: Apply hydrogel as directed Prim Dressing: Promogran Prisma Matrix, 4.34 (sq in) (silver collagen) 1 x Per Week/30 Days ary Discharge Instructions: Moisten collagen with saline or hydrogel Secondary Dressing: Optifoam Non-Adhesive Dressing, 4x4 in 1 x Per Week/30 Days Discharge Instructions: Apply over primary dressing as directed. Compression Wrap: CoFlex Zinc  Unna Boot, 4 x 6 (in/yd) 1 x Per Week/30 Days Discharge Instructions: Apply Coflex Zinc  [employer] as directed. [name], [name] ([id number]) [id number] Page 6 of 11 Electronic Signature(s) Signed: 11/19/2023 3:48:34 PM  By: Amber Nest MD FACS Entered By: Amber Nest on 11/19/2023 15:47:03 -------------------------------------------------------------------------------- Problem List Details Patient Name: Date of Service: [name], [name] 11/19/2023 2:45 PM Medical Record Number: [id number] Patient Account Number: [id number] Date of  Birth/Sex: Treating RN: 1945/12/18 (78 y.o. [name] [name] Primary Care Provider: Rilla [name] Other Clinician: Referring Provider: Treating Provider/Extender: Amber Nest [name] [name] in Treatment: 2 Active Problems ICD-10 Encounter Code Description Active Date MDM Diagnosis [phone] Non-pressure chronic ulcer of other part of left lower leg with fat layer exposed 07/27/2022 No Yes R60.0 Localized edema 07/27/2022 No Yes P16.106 Varicose veins of bilateral lower extremities with other complications 07/27/2022 No Yes I87.2 Venous insufficiency (chronic) (peripheral) 07/27/2022 No Yes G62.9 Polyneuropathy, unspecified 07/27/2022 No Yes Inactive Problems ICD-10 Code Description Active Date Inactive Date L03.115 Cellulitis of right lower limb 10/22/2022 10/22/2022 Resolved Problems ICD-10 Code Description Active Date Resolved Date L97.818 Non-pressure chronic ulcer of other part of right lower leg with other specified severity 10/01/2023 10/22/2022 Electronic Signature(s) Signed: 11/19/2023 3:40:05 PM By: Amber Nest MD FACS Entered By: Amber Nest on 11/19/2023 15:40:05 [name] [name] ([id number]) [id number] Page 7 of 11 -------------------------------------------------------------------------------- Progress Note Details Patient Name: Date of Service: [name], NEMMERS 11/19/2023 2:45 PM Medical Record Number: [id number] Patient Account Number: [id number] Date of Birth/Sex: Treating RN: 06-02-1946 (78 y.o. F) Primary Care Provider: Rilla [name] Other Clinician: Referring Provider: Treating Provider/Extender: Amber Nest [name] [name] in Treatment: 56 Subjective Chief Complaint Information obtained from Patient Patient presents for treatment of an open ulcer due to venous insufficiency History of Present Illness (HPI) ADMISSION This is a 78 year old woman with a history of chronic venous insufficiency  status post saphenous vein ablations in 2010 and 2016. 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 She presents today with an open venous ulcer on her left lower leg. It has been present for a little over 2 weeks. She saw her primary care provider who apparently swabbed the wound and grew out Pseudomonas. She just completed a course of ciprofloxacin  for this. ABI was 0.91. She reports that she has had previous issues with ulcers in this same location, stemming back to a punch biopsy taken by a dermatologist many years ago. She has had several skin substitutes applied to the area that have ultimately resulted in healing on prior occasions. She has 2 small ulcers on her left medial lower leg. There is slough accumulation in both of them. The more anterior of the 2 is quite small and has some soft slough on the surface, underneath which there is good granulation tissue. The more medial wound also has slough accumulation, but the underlying surface is fairly fibrotic and gritty. This is consistent with her provided history of multiple ulcers in the same location. 08/03/2022: The anterior wound is smaller today with just a little bit of slough accumulation. The more medial wound continues to be very sensitive and fibrotic with slough buildup. 08/13/2022: The anterior wound is closed. The more medial wound remains sensitive with a fairly fibrotic surface. There is more granulation tissue filling in, however, and there is less slough than on prior occasions. 08/20/2022: The anterior wound remains closed. The more medial wound has filled with granulation tissue but still has a fair amount of slough on the surface and remains fairly tender. Unfortunately, she has developed 2 small ulcers just proximal to this. The fat layer is exposed in each. She says that over the weekend, she felt a burning sensation in that  location. 08/27/2022: The 2 small wounds proximal to the main wound have  merged into a single site. The wounds look a little bit dry, but they are quite clean without significant slough accumulation. They remain quite tender. 09/03/2022: All of the wounds have now merged into 1 large wound. There is a strong odor coming from the wound and the surface is not particularly viable. It is extremely painful for her today. 09/10/2022: The wound is less black and purple this week but still does not look particularly viable. The surface is desiccated. The culture that I took last week returned with a polymicrobial population including Pseudomonas. I prescribed both Augmentin and levofloxacin . She has not yet picked up levofloxacin , as her pharmacy only notified her yesterday that it was ready. We have ordered a Keystone topical compounded antibiotic, but it has not yet come. 09/17/2022: The wound surface is markedly improved today. There is still an area of grayish-looking muscle but the rest appears significantly more viable. There is still a layer of slough on the surface. She still has a couple days left of oral antibiotic therapy. She has her Patsy compounded topical antibiotic with her today. 09/24/2022: She has completed her oral antibiotic therapy. The wound surface is much cleaner today and more viable without any necrotic tissue. It is a bit desiccated, however. 10/01/2022: The moisture balance of the wound has improved. There is a layer of yellow slough on the surface, but beneath this, the wound is more pink. 10/08/2022: The wound is smaller and cleaner today with a layer of slough present. She is having less pain. 12/18; this patient has a new wound on the right lateral ankle which apparently started after a cat scratched her. Secondarily she managed to drop a cake pan on the same area. This is very painful. The original wound was on the left posterior calf we have been using Keystone Prisma under an Foot locker. The Unna boot fell down and apparently she was in for a  nurse visit perhaps last week and that 1 fell down as well This patient has central venous mediated hypertension. For member correctly she has a stent placed in the left common iliac artery by Dr. Sheree some years ago [facility] Thurner syndrome] she also has had venous ablations and I believe a history of a DVT The patient has compression pumps at home but she does not use them 10/30/2022: The patient says that her wounds burned all week with the Hydrofera Blue classic in place. She has a fair amount of slough and nonviable subcutaneous tissue present on the right ankle. The left posterior calf wound is cleaner than I have seen it to date. Both remain fairly tender. 11/14/2022: Both wounds have substantial slough and eschar accumulation. They remain extremely tender. 11/21/2022: The wounds are cleaner this week, but still have slough and eschar accumulation. She continues to describe burning pain in both wounds, but upon further questioning, she actually has burning in both of her feet that radiates up her legs and includes to the wounds. 11/28/2022: Both wounds have slough and eschar accumulation, as well as adherent silver alginate that was difficult to remove. She continues to have pain out of proportion to the extent of her wounds. Her PCP did initiate Lyrica  which she started taking last night. The dose is quite low, only 25 mg, but apparently there is a plan for upward titration, assuming she tolerates the medication. DONASIA, WIMES ([id number]) [id number] Page 8 of 11 12/05/2022: The wounds are  both a little bit smaller today, but have significant slough accumulation, as usual. They remain exquisitely tender. She is now taking Lyrica  twice a day. 12/19/2022: Both of her wounds look significantly better this week. There is slough buildup on both, but they are smaller. She seems to be getting a good response from Lyrica  as she is having much less pain. 12/26/2022: The wound on  her right lateral ankle is nearly closed. The wound on her left posterior calf is smaller but still has a lot of slough accumulation. They remain tender. 01/02/2023: The right lateral ankle wound is down to just a couple of millimeters. It is much less tender than on prior occasions. There is minimal slough buildup. The wound on her posterior calf continues to contract, as well. It has thick slough buildup and remains fairly tender. 01/09/2023: The right lateral ankle wound is nearly closed. He remains tender. There is just a little eschar on the surface. The wound on her posterior calf has improved quite a bit. There is still slough on the surface, but the more proximal area has epithelialized substantially and there is no longer any gray discoloration to her tissue. 3/13; patient presents for follow-up. Her right lateral ankle wound is almost healed. She has a wound to her left posterior calf with slough and granulation tissue. We have been using Santyl under Unna boots bilaterally to the lower extremities. She has no issues or complaints today. 01/30/2023: The right lateral ankle wound is down to just a pinhole under a layer of eschar. The left posterior calf wound is quite a bit improved since the last time I saw it. There is still a layer of slough on the surface but there has been more epithelialization. 02/07/2023: The right lateral ankle wound still has some eschar on the surface. The left posterior calf wound is about the same size, but with more areas of epithelialization. 02/20/2023: The right lateral ankle wound is healed. The left posterior calf wound is smaller and is essentially flush with the surrounding skin, but the surface is quite dry. Her leg wrap slipped and her calf is quite swollen above the level of the wound. 03/01/2023: The right lateral ankle wound reopened and she also suffered a fall that resulted in a small wound on her right anterior tibial surface. The left posterior calf  wound has a much better moisture balance today. There is a layer of slough on the surface. The Foot locker worked much better for her as far as compression this week. 03/06/2023: The right lateral ankle wound has a layer of eschar on the surface and is too painful for me to debride. The anterior tibial surface wound is more or less healed; it is a very superficial abrasion at this point. The left posterior calf wound has a layer of slough on the surface. Her edema control is better. 03/13/2023: The right lateral ankle wound has a layer of eschar on the surface. Once this was debrided, it was found to be healed. The anterior tibial surface wound has a layer of stuck silver alginate on the surface. This was removed to reveal a very superficial abrasion but not much change from last week. The left posterior calf wound is smaller and has its usual layer of accumulated slough. 03/25/2023: The anterior tibial surface wound has eschar and silver alginate stuck to it. Once this was removed, the wound was found to be healed. The left posterior calf wound is roughly the same size and has a little  bit thicker layer of accumulated slough. 04/03/2023: She has had more drainage from the posterior calf wound over the last week and the layer of slough is thicker. She also says it is more tender. 04/10/2023: Continued increase in drainage from her wound. The wound remains tender with a layer of slough on the surface. It does not appear as viable this week. She also has erythema, swelling, and warmth in her entire left lower leg extending into the midfoot. 04/17/2023: The wound is about the same size this week. There is fibrotic adherent slough, but the drainage is not as profuse. She is currently taking levofloxacin . She is complaining of an increase in the burning sensation at her wound. 04/24/2023: The wound measured smaller this week and is much cleaner-looking. She completed her course of levofloxacin . Her PCP increased her  dose of Lyrica  and she reports that the burning has stopped. 05/01/2023: The wound is smaller again today and quite clean with only minimal slough on the surface. 05/08/2023: The wound is smaller again today. It is fairly clean, but the surface is a bit fibrotic. Moisture balance is better. Her leg is more swollen and erythematous today, however. 05/15/2023: The wound is about the same size today. The surface has very little slough accumulation, but remains fibrotic. Moisture balance is good. She is responding nicely to the oral Keflex  she is taking and her leg is less erythematous and edematous. 05/22/2023: The wound is unchanged in size. There is quite a bit of slough on the surface but the moisture balance is better. It is still fibrotic, but a little bit less so. Her leg is no longer erythematous, but she does have some persistent edema. 05/29/2023: The wound measured a little bit smaller today. It is dry again and the surface is quite fibrotic. The edema in her leg has improved. 06/12/2023: The wound measures slightly larger today. The moisture balance has improved markedly. There is soft slough on the wound surface. Edema control is improved. 06/19/2023: The wound measured slightly smaller today. There is a band of epithelium dividing the most proximal portion of the wound from the remainder. There is slough accumulation and moisture balance is improved. 06/26/2023: The band of epithelium between the 2 open areas has gotten wider. There is some slough accumulation on the surfaces along with a little bit of eschar. Moisture balance continues to be good. 07/03/2023: The band of epithelium continues to expand. The moisture balance remains good and the surface, particularly of the more distal aspect of the wound continues to improve. Slough and eschar accumulation, as usual. 07/10/2023: The wound measured smaller today. Moisture balance is good and the tissue underneath the layer of slough that has  accumulated continues to improve. 07/17/2023: The wound is smaller again today. Moisture balance is appropriate. There is a little bit of slough overlying granulation tissue. 07/24/2023: The band of epithelium between the 2 open areas has gotten wider. The more proximal area has more epithelialization around the perimeter. Both have slough on the surface and remain fairly fibrotic. Moisture balance remains good. 08/07/2023: Both open areas are little bit smaller today. The band of epithelium between the 2 continues to enlarge. There is slough and eschar on both surfaces. 08/14/2023: No significant change to the wound dimensions. The surface is rather dry and fibrotic again. 08/21/2023: The wound is smaller and the skin bridge between the 2 open areas has gotten wider. There is slough accumulation on the surfaces and the moisture balance is improved. Reine, Renai [name] (  [id number]) [id number] Page 9 of 11 08/28/2023: Both open areas of the wound are smaller today. There is slough on the surface, but it is quite soft. Moisture balance is good. 09/04/2023: The band of epithelium between the 2 open areas continues to widen. The wound is very sensitive today, more so than usual. No concern for infection based upon visual inspection, however. There is some slough on the wound surface, which remains fairly fibrotic. 09/11/2023: Both open areas are little bit smaller today. There is slough accumulation on the surface. The underlying tissue is still somewhat fibrotic, but the color is good. Edema control is good. 09/17/2023: The wound continues to improve. The open areas are smaller and the band of skin has gotten wider. The tissue color is possibly the best that I have ever seen it although it does remain somewhat fibrotic. There is slough on the wound surface. 09/25/2023: The wound is smaller again today. The slough on the surface is rubbery but the underlying tissue continues to  improve, quality-wise. It is less fibrotic today. Edema control is good. 10/01/2023: The wound continues to contract. There is slough on the surface. The underlying tissue continues to be less fibrotic from week to week. Unfortunately, she has reopened an area on her right posterior lower leg. It extends to the fat layer and there is slough accumulation on the surface. This occurred despite Unna boot compression. 10/23/2023: The right leg wound has healed. The wound on her left leg has contracted further. The band of skin between the 2 open areas has widened. There is some slough accumulation on the surface. 11/07/2023: The more proximal open portion of her wound is almost completely closed. There is a little bit of eschar on the surface. The distal aspect is smaller and the tissue continues to improve in quality. There is thick slough accumulation. Her wrap slid down and edema control is inadequate. 11/13/2023: The more proximal open portion of her wound is even smaller today, but remains open underneath some eschar. The distal aspect continues to contract and epithelialize. There is slough on the surface. She has a new area on her medial right lower leg that is draining clear fluid. There is no obvious wound but clearly there has been a violation of the skin. 11/19/2023: Both areas of the left lower leg wound are little bit smaller today. There is a layer of slough on the surface and the underlying tissue remains fibrotic, but the color is better. Whatever was leaking on the right leg has sealed up again. Objective Constitutional Hypertensive, asymptomatic. no acute distress. Vitals Time Taken: 2:56 AM, Height: 63 in, Weight: 200 lbs, BMI: 35.4, Temperature: 98.1 F, Pulse: 77 bpm, Respiratory Rate: 18 breaths/min, Blood Pressure: 163/84 mmHg. Respiratory Normal work of breathing on room air.. General Notes: 11/19/2023: Both areas of the left lower leg wound are little bit smaller today. There is a  layer of slough on the surface and the underlying tissue remains fibrotic, but the color is better. Whatever was leaking on the right leg has sealed up again. Integumentary (Hair, Skin) Wound #2 status is Open. Original cause of wound was Blister. The date acquired was: 07/06/2022. The wound has been in treatment 68 weeks. The wound is located on the Left,Posterior Lower Leg. The wound measures 1.5cm length x 1.4cm width x 0.1cm depth; 1.649cm^2 area and 0.165cm^3 volume. There is Fat Layer (Subcutaneous Tissue) exposed. There is no tunneling or undermining noted. There is a medium amount of serosanguineous drainage noted. The  wound margin is distinct with the outline attached to the wound base. There is medium (34-66%) pink granulation within the wound bed. There is a medium (34-66%) amount of necrotic tissue within the wound bed including Adherent Slough. The periwound skin appearance had no abnormalities noted for texture. The periwound skin appearance had no abnormalities noted for moisture. The periwound skin appearance exhibited: Hemosiderin Staining. Periwound temperature was noted as No Abnormality. Wound #7 status is Healed - Epithelialized. Original cause of wound was Blister. The date acquired was: 11/13/2023. The wound is located on the Right,Medial Lower Leg. The wound measures 0cm length x 0cm width x 0cm depth; 0cm^2 area and 0cm^3 volume. There is no tunneling or undermining noted. There is a none present amount of drainage noted. There is no granulation within the wound bed. There is no necrotic tissue within the wound bed. The periwound skin appearance had no abnormalities noted for texture. The periwound skin appearance had no abnormalities noted for moisture. The periwound skin appearance exhibited: Hemosiderin Staining. Periwound temperature was noted as No Abnormality. Assessment Active Problems ICD-10 Non-pressure chronic ulcer of other part of left lower leg with fat layer  exposed Localized edema Varicose veins of bilateral lower extremities with other complications Venous insufficiency (chronic) (peripheral) Polyneuropathy, unspecified RAYEL, SANTIZO ([id number]) [id number].pdf Page 10 of 11 Procedures Wound #2 Pre-procedure diagnosis of Wound #2 is a Venous Leg Ulcer located on the Left,Posterior Lower Leg .Severity of Tissue Pre Debridement is: Fat layer exposed. There was a Excisional Skin/Subcutaneous Tissue Debridement with a total area of 1.65 sq cm performed by Amber Nest, MD. With the following instrument(s): Curette to remove Viable and Non-Viable tissue/material. Material removed includes Eschar, Subcutaneous Tissue, and Slough after achieving pain control using Lidocaine  4% Topical Solution. No specimens were taken. A time out was conducted at 15:35, prior to the start of the procedure. A Minimum amount of bleeding was controlled with Pressure. The procedure was tolerated well with a pain level of 4 throughout and a pain level of 3 following the procedure. Post Debridement Measurements: 1.5cm length x 1.4cm width x 0.1cm depth; 0.165cm^3 volume. Character of Wound/Ulcer Post Debridement is improved. Severity of Tissue Post Debridement is: Fat layer exposed. Post procedure Diagnosis Wound #2: Same as Pre-Procedure Pre-procedure diagnosis of Wound #2 is a Venous Leg Ulcer located on the Left,Posterior Lower Leg . There was a Double Layer Compression Therapy Procedure by Amber Rock, RN. Post procedure Diagnosis Wound #2: Same as Pre-Procedure Notes: coflex zinc . Plan Follow-up Appointments: Return Appointment in 1 week. - Dr. [name] 1/22 @ 2:45 pm Bathing/ Shower/ Hygiene: May shower with protection but do not get wound dressing(s) wet. Protect dressing(s) with water repellant cover (for example, large plastic bag) or a cast cover and may then take shower. Additional Orders / Instructions: Other: - Recommend Nail  Care Lymphedema Treatment Plan - Exercise, Compression and Elevation: Exercise daily as tolerated. (Walking, ROM, Calf Pumps and T T oe aps) Compression Garments, Class II. Wear daily when out of bed. May remove at bedtime - right leg Compression Wraps as ordered - left leg Elevate legs 30 - 60 minutes at or above heart level at least 3 - 4 times daily as able/tolerated Avoid standing for long periods and elevate leg(s) parallel to the floor when sitting Use Pneumatic Compression Device on leg(s) 2-3 times a day for 45-60 minutes WOUND #2: - Lower Leg Wound Laterality: Left, Posterior Cleanser: Soap and Water 1 x Per Week/30 Days Discharge Instructions: May  shower and wash wound with dial antibacterial soap and water prior to dressing change. Peri-Wound Care: Zinc  Oxide Ointment 30g tube 1 x Per Week/30 Days Discharge Instructions: Apply Zinc  Oxide to periwound with each dressing change Peri-Wound Care: Sween Lotion (Moisturizing lotion) 1 x Per Week/30 Days Discharge Instructions: Apply moisturizing lotion as directed Topical: Gentamicin 1 x Per Week/30 Days Discharge Instructions: As directed by physician Topical: Mupirocin Ointment 1 x Per Week/30 Days Discharge Instructions: Apply Mupirocin (Bactroban) as instructed Topical: Skintegrity Hydrogel 4 (oz) 1 x Per Week/30 Days Discharge Instructions: Apply hydrogel as directed Prim Dressing: Promogran Prisma Matrix, 4.34 (sq in) (silver collagen) 1 x Per Week/30 Days ary Discharge Instructions: Moisten collagen with saline or hydrogel Secondary Dressing: Optifoam Non-Adhesive Dressing, 4x4 in 1 x Per Week/30 Days Discharge Instructions: Apply over primary dressing as directed. Com pression Wrap: CoFlex Zinc  Unna Boot, 4 x 6 (in/yd) 1 x Per Week/30 Days Discharge Instructions: Apply Coflex Zinc  [employer] as directed. 11/19/2023: Both areas of the left lower leg wound are little bit smaller today. There is a layer of slough on the surface  and the underlying tissue remains fibrotic, but the color is better. Whatever was leaking on the right leg has sealed up again. I used a curette to debride eschar, slough, and subcutaneous tissue from the left leg wounds. We will continue the mixture of topical gentamicin and mupirocin with Prisma silver collagen moistened with hydrogel and Optifoam cover to maintain moisture balance. Continue zinc  Coflex compression wrap. Follow-up in 1 week. Electronic Signature(s) Signed: 11/19/2023 3:47:50 PM By: Amber Nest MD FACS Entered By: Amber Nest on 11/19/2023 15:47:50 -------------------------------------------------------------------------------- SuperBill Details Patient Name: Date of Service: ZARIN, HAGMANN 11/19/2023 [name] [name] ([id number]) [phone].pdf Page 11 of 11 Medical Record Number: [id number] Patient Account Number: [id number] Date of Birth/Sex: Treating RN: 1946-10-10 (78 y.o. F) Primary Care Provider: Rilla [name] Other Clinician: Referring Provider: Treating Provider/Extender: Amber Nest [name] [name] in Treatment: 78 Diagnosis Coding ICD-10 Codes Code Description [phone] Non-pressure chronic ulcer of other part of left lower leg with fat layer exposed R60.0 Localized edema I83.893 Varicose veins of bilateral lower extremities with other complications I87.2 Venous insufficiency (chronic) (peripheral) G62.9 Polyneuropathy, unspecified Facility Procedures : CPT4 Code: [id number] Description: 11042 - DEB SUBQ TISSUE 20 SQ CM/< ICD-10 Diagnosis Description L97.822 Non-pressure chronic ulcer of other part of left lower leg with fat layer expo Modifier: sed Quantity: 1 Physician Procedures : CPT4 Code Description Modifier [id number] - WC PHYS LEVEL 4 - EST PT ICD-10 Diagnosis Description L97.822 Non-pressure chronic ulcer of other part of left lower leg with fat layer exposed R60.0 Localized edema I83.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

## 2023-11-21 NOTE — Progress Notes (Signed)
 [name], [name] ([id number]) [id number] Page 1 of 9 Visit Report for 11/19/2023 Arrival Information Details Patient Name: Date of Service: [name], [name] 11/19/2023 2:45 PM Medical Record Number: [id number] Patient Account Number: [id number] Date of Birth/Sex: Treating RN: February 11, 1946 (78 y.o. F) Primary Care [name]: [name] Other Clinician: Referring [name]: Treating [name]/Extender: [name]: 35 Visit Information History Since Last Visit Added or deleted any medications: No Patient Arrived: Ambulatory Any new allergies or adverse reactions: No Arrival Time: 14:55 Had a fall or experienced change in No Accompanied By: self activities of daily living that may affect Transfer Assistance: None risk of falls: Patient Identification Verified: Yes Signs or symptoms of abuse/neglect since last visito No Secondary Verification Process Completed: Yes Hospitalized since last visit: No Patient Requires Transmission-Based Precautions: No Implantable device outside of the clinic excluding No Patient Has Alerts: No cellular tissue based products placed in the center since last visit: Has Dressing in Place as Prescribed: Yes Has Compression in Place as Prescribed: Yes Pain Present Now: No Electronic Signature(s) Signed: 11/19/2023 5:09:31 PM By: Merleen Rock RN, [name] Entered By: Merleen [name] on 11/19/2023 15:24:02 -------------------------------------------------------------------------------- Compression Therapy Details Patient Name: Date of Service: [name] 11/19/2023 2:45 PM Medical Record Number: [id number] Patient Account Number: [id number] Date of Birth/Sex: Treating RN: February 11, 1946 (78 y.o. [name] Primary Care [name]: [name] Other Clinician: Referring [name]: Treating Kamilya Wakeman/Extender: [name]: 68 Compression  Therapy Performed for Wound Assessment: Wound #2 Left,Posterior Lower Leg Performed By: Clinician Merleen Rock, RN Compression Type: Double Layer Post Procedure Diagnosis Same as Pre-procedure Notes coflex zinc  Electronic Signature(s) Signed: 11/19/2023 5:09:31 PM By: Merleen Rock RN, [name] Entered By: Merleen [name] on 11/19/2023 15:40:38 [name] ([id number]) [id number] Page 2 of 9 -------------------------------------------------------------------------------- Encounter Discharge Information Details Patient Name: Date of Service: [name], [name] 11/19/2023 2:45 PM Medical Record Number: [id number] Patient Account Number: [id number] Date of Birth/Sex: Treating RN: [name] 19, 1946 (78 y.o. [name] Primary Care [name]: [name] Other Clinician: Referring [name]: Treating [name]: [name]: 74 Encounter Discharge Information Items Post Procedure Vitals Discharge Condition: Stable Temperature (F): 98.1 Ambulatory Status: Ambulatory Pulse (bpm): 77 Discharge Destination: Home Respiratory Rate (breaths/min): 18 Transportation: Private Auto Blood Pressure (mmHg): 163/84 Accompanied By: self Schedule Follow-up Appointment: Yes Clinical Summary of Care: Patient Declined Electronic Signature(s) Signed: 11/19/2023 5:09:31 PM By: Merleen Rock RN, [name] Entered By: Merleen [name] on 11/19/2023 16:28:37 -------------------------------------------------------------------------------- Lower Extremity Assessment Details Patient Name: Date of Service: [name], [name] 11/19/2023 2:45 PM Medical Record Number: [id number] Patient Account Number: [id number] Date of Birth/Sex: Treating RN: 05-04-1946 (78 y.o. [name] Primary Care Arkin Imran: [name] Other Clinician: Referring Shardai Star: Treating Baylei Siebels/Extender: [name] [name] [name] in  [name]: 68 Edema Assessment Assessed: [Left: No] [Right: No] Edema: [Left: Ye] [Right: s] Calf Left: Right: Point of Measurement: 31 cm From Medial Instep 46.5 cm 38 cm Ankle Left: Right: Point of Measurement: 9 cm From Medial Instep 23 cm 22.5 cm Vascular Assessment Pulses: Dorsalis Pedis Palpable: [Left:Yes] [Right:Yes] Extremity colors, hair growth, and conditions: Extremity Color: [Left:Hyperpigmented] Hair Growth on Extremity: [Left:Yes] Temperature of Extremity: [Left:Warm] Capillary Refill: [Left:< 3 seconds] Dependent Rubor: [Left:No No] Electronic Signature(s) [name] ([id number]) [id number].pdf Page 3 of 9 Signed: 11/19/2023 5:09:31 PM By: Merleen Rock RN, [name] Entered By: Merleen [name] on 11/19/2023 15:26:46 -------------------------------------------------------------------------------- Multi Wound Chart Details Patient Name: Date of Service: [name].  11/19/2023 2:45 PM Medical Record Number: [id number] Patient Account Number: [id number] Date of Birth/Sex: Treating RN: [birth year] (78 y.o. F) Primary Care Lizanne Erker: [name] Other Clinician: Referring Allisson Schindel: Treating Fabianna Keats/Extender: [name]: 68 Vital Signs Height(in): 63 Pulse(bpm): 77 Weight(lbs): 200 Blood Pressure(mmHg): 163/84 Body Mass Index(BMI): 35.4 Temperature(F): 98.1 Respiratory Rate(breaths/min): 18 [2:Photos:] [N/A:N/A] Left, Posterior Lower Leg Right, Medial Lower Leg N/A Wound Location: Blister Blister N/A Wounding Event: Venous Leg Ulcer Venous Leg Ulcer N/A Primary Etiology: Cataracts, Asthma, Hypertension, Cataracts, Asthma, Hypertension, N/A Comorbid History: Neuropathy, Received Chemotherapy, Neuropathy, Received Chemotherapy, Received Radiation Received Radiation 07/06/2022 11/13/2023 N/A Date Acquired: 68 0 N/A Weeks of [name]: Open Healed - Epithelialized N/A Wound Status: No  No N/A Wound Recurrence: Yes No N/A Clustered Wound: 2 N/A N/A Clustered Quantity: 1.5x1.4x0.1 0x0x0 N/A Measurements L x W x D (cm) 1.649 0 N/A A (cm) : rea 0.165 0 N/A Volume (cm) : 76.00% 100.00% N/A % Reduction in A rea: 76.00% 100.00% N/A % Reduction in Volume: Full Thickness Without Exposed Full Thickness Without Exposed N/A Classification: Support Structures Support Structures Medium None Present N/A Exudate A mount: Serosanguineous N/A N/A Exudate Type: red, brown N/A N/A Exudate Color: Distinct, outline attached N/A N/A Wound Margin: Medium (34-66%) None Present (0%) N/A Granulation A mount: Pink N/A N/A Granulation Quality: Medium (34-66%) None Present (0%) N/A Necrotic A mount: Fat Layer (Subcutaneous Tissue): Yes Fascia: No N/A Exposed Structures: Fascia: No Fat Layer (Subcutaneous Tissue): No Tendon: No Tendon: No Muscle: No Muscle: No Joint: No Joint: No Bone: No Bone: No Small (1-33%) Large (67-100%) N/A Epithelialization: Debridement - Excisional N/A N/A Debridement: Pre-procedure Verification/Time Out 15:35 N/A N/A Taken: Lidocaine  4% Topical Solution N/A N/A Pain Control: Necrotic/Eschar, Subcutaneous, N/A N/A Tissue Debrided: Slough Skin/Subcutaneous Tissue N/A N/A Level: 1.65 N/A N/A Debridement A (sq cm): rea Curette N/A N/A InstrumentBEVELY, HACKBART ([id number]) [id number] Page 4 of 9 Minimum N/A N/A Bleeding: Pressure N/A N/A Hemostasis A chieved: 4 N/A N/A Procedural Pain: 3 N/A N/A Post Procedural Pain: Procedure was tolerated well N/A N/A Debridement [name] Response: 1.5x1.4x0.1 N/A N/A Post Debridement Measurements L x W x D (cm) 0.165 N/A N/A Post Debridement Volume: (cm) No Abnormalities Noted No Abnormalities Noted N/A Periwound Skin Texture: No Abnormalities Noted No Abnormalities Noted N/A Periwound Skin Moisture: Hemosiderin Staining: Yes Hemosiderin Staining: Yes  N/A Periwound Skin Color: No Abnormality No Abnormality N/A Temperature: Debridement N/A N/A Procedures Performed: [name] Notes Electronic Signature(s) Signed: 11/19/2023 3:40:14 PM By: Amber Delon MD FACS Entered By: [name] on 11/19/2023 15:40:14 -------------------------------------------------------------------------------- Multi-Disciplinary Care Plan Details Patient Name: Date of Service: [name] 11/19/2023 2:45 PM Medical Record Number: [id number] Patient Account Number: [id number] Date of Birth/Sex: Treating RN: 12/30/1945 (78 y.o. [name] Primary Care Lenya Sterne: [name] Other Clinician: Referring Loreli Debruler: Treating Marcelino Campos/Extender: [name]: 39 Multidisciplinary Care Plan reviewed with physician Active Inactive Venous Leg Ulcer Nursing Diagnoses: Knowledge deficit related to disease process and management Potential for venous Insuffiency (use before diagnosis confirmed) Goals: Patient will maintain optimal edema control Date Initiated: 02/07/2023 Target Resolution Date: 12/18/2023 Goal Status: Active Interventions: Assess peripheral edema status every visit. Compression as ordered [name] Activities: Therapeutic compression applied : 02/07/2023 Notes: Electronic Signature(s) Signed: 11/19/2023 5:09:31 PM By: Merleen Rock RN, [name] Entered By: Boehlein, Lundyn on 11/19/2023 15:36:00 [name] JINNY ([id number]) [phone].pdf Page 5 of 9 -------------------------------------------------------------------------------- Pain Assessment Details Patient Name: Date of Service: [name], [name].  11/19/2023 2:45 PM Medical Record Number: [id number] Patient Account Number: [id number] Date of Birth/Sex: Treating RN: April 30, 1946 (78 y.o. F) Primary Care Tai Skelly: [name] Other Clinician: Referring Jaielle Dlouhy: Treating Gil Ingwersen/Extender: [name]: 68 Active Problems Location of Pain Severity and Description of Pain Patient Has Paino No Site Locations Rate the pain. Current Pain Level: 0 Pain Management and Medication Current Pain Management: Electronic Signature(s) Signed: 11/19/2023 5:09:31 PM By: Merleen Handing RN, [name] Entered By: Boehlein, Zariah on 11/19/2023 15:24:16 -------------------------------------------------------------------------------- Patient/Caregiver Education Details Patient Name: Date of Service: [name], [name] 1/14/2025andnbsp2:45 PM Medical Record Number: [id number] Patient Account Number: [id number] Date of Birth/Gender: Treating RN: 16-Apr-1946 (78 y.o. Amber Merleen Handing Primary Care Physician: [name] Other Clinician: Referring Physician: Treating Physician/Extender: [name]: 73 Education Assessment Education Provided To: Patient Education Topics Provided Venous: Methods: Explain/Verbal Responses: Reinforcements needed, State content correctly Wound/Skin Impairment: Methods: Explain/Verbal Responses: Reinforcements needed, State content correctly [name], [name] ([id number]) [phone].pdf Page 6 of 9 Electronic Signature(s) Signed: 11/19/2023 5:09:31 PM By: Merleen Handing RN, [name] Entered By: Merleen Handing on 11/19/2023 15:36:33 -------------------------------------------------------------------------------- Wound Assessment Details Patient Name: Date of Service: [name], [name] 11/19/2023 2:45 PM Medical Record Number: [id number] Patient Account Number: [id number] Date of Birth/Sex: Treating RN: Sep 02, 1946 (78 y.o. Amber Merleen, Camisha Primary Care [name] Diemer: [name] Other Clinician: Referring Bethel Gaglio: Treating Claritza July/Extender: [name]: 68 Wound Status Wound Number: 2 Primary Venous Leg  Ulcer Etiology: Wound Location: Left, Posterior Lower Leg Wound Open Wounding Event: Blister Status: Date Acquired: 07/06/2022 Comorbid Cataracts, Asthma, Hypertension, Neuropathy, Received Weeks Of [name]: 68 History: Chemotherapy, Received Radiation Clustered Wound: Yes Photos Wound Measurements Length: (cm) Width: (cm) Depth: (cm) Clustered Quantity: Area: (cm) Volume: (cm) 1.5 % Reduction in Area: 76% 1.4 % Reduction in Volume: 76% 0.1 Epithelialization: Small (1-33%) 2 Tunneling: No 1.649 Undermining: No 0.165 Wound Description Classification: Full Thickness Without Exposed Sup Wound Margin: Distinct, outline attached Exudate Amount: Medium Exudate Type: Serosanguineous Exudate Color: red, brown port Structures Foul Odor After Cleansing: No Slough/Fibrino Yes Wound Bed Granulation Amount: Medium (34-66%) Exposed Structure Granulation Quality: Pink Fascia Exposed: No Necrotic Amount: Medium (34-66%) Fat Layer (Subcutaneous Tissue) Exposed: Yes Necrotic Quality: Adherent Slough Tendon Exposed: No Muscle Exposed: No Joint Exposed: No Bone Exposed: No Periwound Skin Texture Texture Color No Abnormalities Noted: Yes No Abnormalities Noted: No Hemosiderin Staining: Yes Moisture No Abnormalities Noted: Yes Temperature / Pain Temperature: No Abnormality [name], [name] ([id number]) [id number].pdf Page 7 of 9 [name] Notes Wound #2 (Lower Leg) Wound Laterality: Left, Posterior Cleanser Soap and Water Discharge Instruction: May shower and wash wound with dial antibacterial soap and water prior to dressing change. Peri-Wound Care Zinc  Oxide Ointment 30g tube Discharge Instruction: Apply Zinc  Oxide to periwound with each dressing change Sween Lotion (Moisturizing lotion) Discharge Instruction: Apply moisturizing lotion as directed Topical Gentamicin Discharge Instruction: As directed by physician Mupirocin Ointment Discharge  Instruction: Apply Mupirocin (Bactroban) as instructed Skintegrity Hydrogel 4 (oz) Discharge Instruction: Apply hydrogel as directed Primary Dressing Promogran Prisma Matrix, 4.34 (sq in) (silver collagen) Discharge Instruction: Moisten collagen with saline or hydrogel Secondary Dressing Optifoam Non-Adhesive Dressing, 4x4 in Discharge Instruction: Apply over primary dressing as directed. Secured With Compression Wrap CoFlex Zinc  [employer], 4 x 6 (in/yd) Discharge Instruction: Apply Coflex Zinc  [employer] as directed. Compression Stockings Add-Ons Electronic Signature(s) Signed: 11/19/2023 5:09:31 PM By: Boehlein, Jevon RN, [name] Entered By: Boehlein, Karesa on 11/19/2023 15:29:31 --------------------------------------------------------------------------------  Wound Assessment Details Patient Name: Date of Service: [name], [name] 11/19/2023 2:45 PM Medical Record Number: [id number] Patient Account Number: [id number] Date of Birth/Sex: Treating RN: 09-08-1946 (78 y.o. [name] Primary Care Anniyah Mood: [name] Other Clinician: Referring Janki Dike: Treating Aldona Bryner/Extender: [name] [name] Weeks in [name]: 68 Wound Status Wound Number: 7 Primary Venous Leg Ulcer Etiology: Wound Location: Right, Medial Lower Leg Wound Healed - Epithelialized Wounding Event: Blister Status: Date Acquired: 11/13/2023 Comorbid Cataracts, Asthma, Hypertension, Neuropathy, Received Weeks Of [name]: 0 History: Chemotherapy, Received Radiation Clustered Wound: No Photos SHONDREA, STEINERT ([id number]) [phone].pdf Page 8 of 9 Wound Measurements Length: (cm) Width: (cm) Depth: (cm) Area: (cm) Volume: (cm) 0 % Reduction in Area: 100% 0 % Reduction in Volume: 100% 0 Epithelialization: Large (67-100%) 0 Tunneling: No 0 Undermining: No Wound Description Classification: Full Thickness Without Exposed Support Structures Exudate Amount:  None Present Foul Odor After Cleansing: No Slough/Fibrino No Wound Bed Granulation Amount: None Present (0%) Exposed Structure Necrotic Amount: None Present (0%) Fascia Exposed: No Fat Layer (Subcutaneous Tissue) Exposed: No Tendon Exposed: No Muscle Exposed: No Joint Exposed: No Bone Exposed: No Periwound Skin Texture Texture Color No Abnormalities Noted: Yes No Abnormalities Noted: No Hemosiderin Staining: Yes Moisture No Abnormalities Noted: Yes Temperature / Pain Temperature: No Abnormality Electronic Signature(s) Signed: 11/19/2023 5:09:31 PM By: Merleen Rock RN, [name] Entered By: Merleen [name] on 11/19/2023 15:30:46 -------------------------------------------------------------------------------- Vitals Details Patient Name: Date of Service: [name] 11/19/2023 2:45 PM Medical Record Number: [id number] Patient Account Number: [id number] Date of Birth/Sex: Treating RN: 07-17-1946 (78 y.o. F) Primary Care Reon Hunley: [name] Other Clinician: Referring Matika Bartell: Treating Caldwell Kronenberger/Extender: [name]: 68 Vital Signs Time Taken: 02:56 Temperature (F): 98.1 Height (in): 63 Pulse (bpm): 77 Weight (lbs): 200 Respiratory Rate (breaths/min): 18 Body Mass Index (BMI): 35.4 Blood Pressure (mmHg): 163/84 Reference Range: 80 - 120 mg / dl Electronic Signature(s) Signed: 11/19/2023 5:09:31 PM By: Merleen Rock RN, [name] [address] [place] ([id number]) [id number] Page 9 of 9 Entered By: Merleen [name] on 11/19/2023 15:24:06

## 2023-11-27 ENCOUNTER — Ambulatory Visit (HOSPITAL_BASED_OUTPATIENT_CLINIC_OR_DEPARTMENT_OTHER): Payer: Medicare Other | Admitting: General Surgery

## 2023-12-02 ENCOUNTER — Ambulatory Visit (INDEPENDENT_AMBULATORY_CARE_PROVIDER_SITE_OTHER): Payer: Medicare Other | Admitting: Podiatry

## 2023-12-02 DIAGNOSIS — Z91199 Patient's noncompliance with other medical treatment and regimen due to unspecified reason: Secondary | ICD-10-CM

## 2023-12-02 NOTE — Progress Notes (Signed)
1. No-show for appointment

## 2023-12-04 ENCOUNTER — Ambulatory Visit: Payer: Medicare Other | Admitting: Family Medicine

## 2023-12-04 ENCOUNTER — Encounter: Payer: Self-pay | Admitting: Family Medicine

## 2023-12-04 ENCOUNTER — Encounter (HOSPITAL_BASED_OUTPATIENT_CLINIC_OR_DEPARTMENT_OTHER): Payer: Medicare Other | Admitting: General Surgery

## 2023-12-04 VITALS — BP 150/68 | HR 64 | Temp 98.0°F | Ht 60.5 in | Wt 207.0 lb

## 2023-12-04 DIAGNOSIS — Z7189 Other specified counseling: Secondary | ICD-10-CM | POA: Diagnosis not present

## 2023-12-04 DIAGNOSIS — I83892 Varicose veins of left lower extremities with other complications: Secondary | ICD-10-CM

## 2023-12-04 DIAGNOSIS — I871 Compression of vein: Secondary | ICD-10-CM | POA: Diagnosis not present

## 2023-12-04 DIAGNOSIS — I83029 Varicose veins of left lower extremity with ulcer of unspecified site: Secondary | ICD-10-CM

## 2023-12-04 DIAGNOSIS — E559 Vitamin D deficiency, unspecified: Secondary | ICD-10-CM

## 2023-12-04 DIAGNOSIS — G629 Polyneuropathy, unspecified: Secondary | ICD-10-CM

## 2023-12-04 DIAGNOSIS — I83893 Varicose veins of bilateral lower extremities with other complications: Secondary | ICD-10-CM | POA: Diagnosis not present

## 2023-12-04 DIAGNOSIS — R600 Localized edema: Secondary | ICD-10-CM

## 2023-12-04 DIAGNOSIS — J4520 Mild intermittent asthma, uncomplicated: Secondary | ICD-10-CM

## 2023-12-04 DIAGNOSIS — Z85820 Personal history of malignant melanoma of skin: Secondary | ICD-10-CM

## 2023-12-04 DIAGNOSIS — G3184 Mild cognitive impairment, so stated: Secondary | ICD-10-CM | POA: Diagnosis not present

## 2023-12-04 DIAGNOSIS — E876 Hypokalemia: Secondary | ICD-10-CM | POA: Diagnosis not present

## 2023-12-04 DIAGNOSIS — L97822 Non-pressure chronic ulcer of other part of left lower leg with fat layer exposed: Secondary | ICD-10-CM | POA: Diagnosis not present

## 2023-12-04 DIAGNOSIS — L97222 Non-pressure chronic ulcer of left calf with fat layer exposed: Secondary | ICD-10-CM | POA: Diagnosis not present

## 2023-12-04 DIAGNOSIS — E611 Iron deficiency: Secondary | ICD-10-CM

## 2023-12-04 DIAGNOSIS — G902 Horner's syndrome: Secondary | ICD-10-CM

## 2023-12-04 DIAGNOSIS — I10 Essential (primary) hypertension: Secondary | ICD-10-CM

## 2023-12-04 DIAGNOSIS — K529 Noninfective gastroenteritis and colitis, unspecified: Secondary | ICD-10-CM | POA: Diagnosis not present

## 2023-12-04 DIAGNOSIS — M1189 Other specified crystal arthropathies, multiple sites: Secondary | ICD-10-CM

## 2023-12-04 DIAGNOSIS — I872 Venous insufficiency (chronic) (peripheral): Secondary | ICD-10-CM | POA: Diagnosis not present

## 2023-12-04 LAB — COMPREHENSIVE METABOLIC PANEL
ALT: 20 U/L (ref 0–35)
AST: 15 U/L (ref 0–37)
Albumin: 4.1 g/dL (ref 3.5–5.2)
Alkaline Phosphatase: 56 U/L (ref 39–117)
BUN: 18 mg/dL (ref 6–23)
CO2: 32 meq/L (ref 19–32)
Calcium: 9.1 mg/dL (ref 8.4–10.5)
Chloride: 104 meq/L (ref 96–112)
Creatinine, Ser: 0.73 mg/dL (ref 0.40–1.20)
GFR: 79.33 mL/min (ref >60.00)
Glucose, Bld: 89 mg/dL (ref 70–99)
Potassium: 3.9 meq/L (ref 3.5–5.1)
Sodium: 142 meq/L (ref 135–145)
Total Bilirubin: 0.5 mg/dL (ref 0.2–1.2)
Total Protein: 6.8 g/dL (ref 6.0–8.3)

## 2023-12-04 LAB — LIPID PANEL
Cholesterol: 161 mg/dL (ref 0–200)
HDL: 59.6 mg/dL (ref >39.00)
LDL Cholesterol: 91 mg/dL (ref 0–99)
NonHDL: 101.88
Total CHOL/HDL Ratio: 3
Triglycerides: 52 mg/dL (ref 0.0–149.0)
VLDL: 10.4 mg/dL (ref 0.0–40.0)

## 2023-12-04 LAB — TSH: TSH: 2.21 u[IU]/mL (ref 0.35–5.50)

## 2023-12-04 LAB — IBC PANEL
Iron: 61 ug/dL (ref 42–145)
Saturation Ratios: 19.2 % — ABNORMAL LOW (ref 20.0–50.0)
TIBC: 317.8 ug/dL (ref 250.0–450.0)
Transferrin: 227 mg/dL (ref 212.0–360.0)

## 2023-12-04 LAB — FERRITIN: Ferritin: 52.7 ng/mL (ref 10.0–291.0)

## 2023-12-04 LAB — VITAMIN D 25 HYDROXY (VIT D DEFICIENCY, FRACTURES): VITD: 54.19 ng/mL (ref 30.00–100.00)

## 2023-12-04 MED ORDER — POTASSIUM CHLORIDE CRYS ER 20 MEQ PO TBCR: 20.0000 meq | EXTENDED_RELEASE_TABLET | Freq: Two times a day (BID) | ORAL | 4 refills | Status: AC

## 2023-12-04 MED ORDER — MONTELUKAST SODIUM 10 MG PO TABS: 10.0000 mg | ORAL_TABLET | Freq: Every day | ORAL | 4 refills | Status: DC

## 2023-12-04 MED ORDER — ALBUTEROL SULFATE HFA 108 (90 BASE) MCG/ACT IN AERS: 1.0000 | INHALATION_SPRAY | Freq: Four times a day (QID) | RESPIRATORY_TRACT | 3 refills | Status: DC | PRN

## 2023-12-04 MED ORDER — LOSARTAN POTASSIUM 50 MG PO TABS: 50.0000 mg | ORAL_TABLET | Freq: Every day | ORAL | 4 refills | Status: AC

## 2023-12-04 MED ORDER — FUROSEMIDE 40 MG PO TABS: 40.0000 mg | ORAL_TABLET | Freq: Every day | ORAL | 4 refills | Status: AC

## 2023-12-04 MED ORDER — VITAMIN D (ERGOCALCIFEROL) 1.25 MG (50000 UNIT) PO CAPS: ORAL_CAPSULE | ORAL | 4 refills | Status: AC

## 2023-12-04 NOTE — Patient Instructions (Addendum)
Labs today  Call [facility] to see if you're due for repeat mammogram.  If interested, check with pharmacy about new 2 shot shingles series (shingrix).  Call and schedule dentist and dermatologist appointments. Return as needed or in 6 months for follow up visit

## 2023-12-04 NOTE — Assessment & Plan Note (Signed)
Albuterol rescue inhaler refilled - rare use.

## 2023-12-04 NOTE — Assessment & Plan Note (Addendum)
Advanced directives: doesn't want prolonged life support if terminal condition. Ok with CPR and intubation if necessary. Would be ok with feeding tube. HCPOA would be sister [place]. Needs to update - planning to meet with attorney.

## 2023-12-04 NOTE — Assessment & Plan Note (Signed)
Chronic. BP staying elevated in office.  I suggested increasing losartan dose, she declines as she states bp traditionally well controlled.  I recommend she buy BP cuff to monitor at home - she declines. She will let me know if BP trends up at future checks including weekly wound clinic visits.

## 2023-12-04 NOTE — Assessment & Plan Note (Signed)
Continues Kdur BID, she is on lasix.

## 2023-12-04 NOTE — Assessment & Plan Note (Addendum)
Overall stable period - thought med related changes.  Planning to taper off lyrica once chronic venous insufficiency leg wound heals.

## 2023-12-04 NOTE — Assessment & Plan Note (Addendum)
She plans to slowly  taper off lyrica concerned with possible cognitive effect - currently on 25mg  bid dose. Discussed tapering schedule.

## 2023-12-04 NOTE — Assessment & Plan Note (Addendum)
Previously saw rheum for pseudogout, not recently

## 2023-12-04 NOTE — Progress Notes (Signed)
Ph: [phone] Fax: [phone]   Patient ID: [name], female    DOB: Jul 01, 1946, 78 y.o.   MRN: [id number]  This visit was conducted in person.  BP (!) 150/68   Pulse 64   Temp 98 F (36.7 C) (Oral)   Ht 5' 0.5" (1.537 m)   Wt 207 lb (93.9 kg)   SpO2 93%   BMI 39.76 kg/m   BP Readings from Last 3 Encounters:  12/04/23 (!) 150/68  09/16/23 132/70  09/12/23 132/74  160/72 on retesting   CC: CPE Subjective:   HPI: [name] is a 78 y.o. female presenting on 12/04/2023 for Annual Exam [facility] prt 2 [AWV-11/04/23].)   Saw health advisor 10/2023 for medicare wellness visit. Note reviewed.   No results found.  Flowsheet Row Clinical Support from 11/04/2023 in [facility]  PHQ-2 Total Score 0          11/04/2023    2:16 PM 09/16/2023   11:50 AM 03/11/2023    4:07 PM 11/16/2022   12:15 PM 10/19/2022   12:09 PM  Fall Risk   Falls in the past year? 1 1 1  0 0  Number falls in past yr: 0 0 1  0  Injury with Fall? 0 0 1  0  Risk for fall due to : No Fall Risks    No Fall Risks  Follow up Education provided;Falls prevention discussed  Falls evaluation completed;Education provided;Falls prevention discussed  Falls prevention discussed;Falls evaluation completed   Continues seeing [facility] wound [facility] (Dr Lady Gary) for chronic venous insufficiency ulcers.  Was rushing this morning - attributes elevated BP due to this.  Insomnia thought situational sleep maintenance - didn't try melatonin. Planning to try chamomile tablets.   Has GI and pulm f/u 12/2023.  Ongoing diarrhea for almost 3 years. Pending stool sample through GI.   Preventative: COLONOSCOPY WITH PROPOFOL 01/06/2018 - diverticulosis, decreased sphincter tone, no f/u needed Myrtie Neither, Andreas Blower, MD)  Well woman exam with OBGYN Dr Shawnie Pons last seen 05/2020. No pelvic pain, vaginal bleeding or skin changes. Mammogram 03/2022 Resa Miner at [facility] - advised call to schedule  appt.  DEXA 06/2016 WNL.  Lung cancer screening - not eligible Flu shot - yearly  COVID vaccine Moderna 02/2020, 03/2020, booster 11/2020, bivalent booster 08/2021, 08/2022, 09/2023 Pneumovax 05/2013 , Prevnar-13 09/2014 RSV 10/2022 Td 2020, Tdap 01/2022 Shingrix - discussed, planning to get. H/o shingles.  Advanced directives: doesn't want prolonged life support if terminal condition. Ok with CPR and intubation if necessary. Would be ok with feeding tube. HCPOA would be sister Johnny Bridge. Needs to update - planning to meet with attorney.  Seat belt use discussed.  Sunscreen use discussed. No changing moles on skin. Sees derm Dr Yetta Barre and onc yearly (h/o melanoma).  Ex smoker.  Alcohol - none  Dentist - due  Eye exam - yearly at [facility]  Bowel - no constipation, ongoing diarrhea Bladder - no incontinence    Lives alone in RV. Sister and husband live next door   Occupation: retired, prior worked as Emergency planning/management officer at [employer]   Edu: some college Activity: no regular exercise Diet: poor - good water, rare fruits/vegetables     Relevant past medical, surgical, family and social history reviewed and updated as indicated. Interim medical history since our last visit reviewed. Allergies and medications reviewed and updated. Outpatient Medications Prior to Visit  Medication Sig Dispense Refill   acetaminophen (  TYLENOL) 650 MG CR tablet Take 2 tablets (1,300 mg total) by mouth 2 (two) times daily as needed for pain.     aspirin EC 81 MG tablet Take 81 mg by mouth at bedtime. Melanoma prevention     loratadine (CLARITIN) 10 MG tablet Take 10 mg by mouth daily.     Multiple Vitamin (MULTIVITAMIN WITH MINERALS) TABS tablet Take 1 tablet by mouth daily.     phenol (CHLORASEPTIC GARGLE) 1.4 % LIQD Use as directed 1 spray in the mouth or throat as needed for throat irritation / pain. 118 mL 0   Polyvinyl Alcohol-Povidone (TEARS PLUS OP) Place 1 drop into both eyes daily as needed (dry eyes/ redness/  burning).      PRESCRIPTION MEDICATION CPAP     saccharomyces boulardii (FLORASTOR) 250 MG capsule Take 1 capsule (250 mg total) by mouth 2 (two) times daily.     triamcinolone (NASACORT) 55 MCG/ACT AERO nasal inhaler Place 2 sprays into the nose daily. In each nostril     albuterol (VENTOLIN HFA) 108 (90 Base) MCG/ACT inhaler INHALE 1-2 PUFFS BY MOUTH EVERY 6 HOURS AS NEEDED FOR WHEEZE OR SHORTNESS OF BREATH 8.5 each 2   furosemide (LASIX) 40 MG tablet Take 1 tablet (40 mg total) by mouth daily. Take second dose if needed for leg swelling/weight gain 100 tablet 4   losartan (COZAAR) 50 MG tablet Take 1 tablet (50 mg total) by mouth daily. 90 tablet 4   montelukast (SINGULAIR) 10 MG tablet Take 1 tablet (10 mg total) by mouth daily. 90 tablet 4   potassium chloride SA (KLOR-CON M20) 20 MEQ tablet Take 1 tablet (20 mEq total) by mouth 2 (two) times daily. 180 tablet 4   pregabalin (LYRICA) 25 MG capsule TAKE 1 CAPSULE (25 MG TOTAL) BY MOUTH IN THE MORNING AND 2 CAPSULES (50 MG TOTAL) AT BEDTIME. 90 capsule 3   Vitamin D, Ergocalciferol, (DRISDOL) 1.25 MG (50000 UNIT) CAPS capsule TAKE 1 CAPSULE BY MOUTH ONCE A WEEK 12 capsule 4   pregabalin (LYRICA) 25 MG capsule Take 1 capsule (25 mg total) by mouth 2 (two) times daily.     No facility-administered medications prior to visit.     Per HPI unless specifically indicated in ROS section below Review of Systems  Objective:  BP (!) 150/68   Pulse 64   Temp 98 F (36.7 C) (Oral)   Ht 5' 0.5" (1.537 m)   Wt 207 lb (93.9 kg)   SpO2 93%   BMI 39.76 kg/m   Wt Readings from Last 3 Encounters:  12/04/23 207 lb (93.9 kg)  11/04/23 203 lb (92.1 kg)  09/16/23 203 lb 2 oz (92.1 kg)      Physical Exam Vitals and nursing note reviewed.  Constitutional:      Appearance: Normal appearance. She is not ill-appearing.  HENT:     Head: Normocephalic and atraumatic.     Right Ear: Ear canal and external ear normal. There is impacted cerumen.     Left  Ear: Tympanic membrane, ear canal and external ear normal. There is no impacted cerumen.     Mouth/Throat:     Mouth: Mucous membranes are moist.     Pharynx: Oropharynx is clear. No oropharyngeal exudate or posterior oropharyngeal erythema.  Eyes:     General:        Right eye: No discharge.        Left eye: No discharge.     Extraocular Movements: Extraocular movements intact.  Conjunctiva/sclera: Conjunctivae normal.     Pupils: Pupils are equal, round, and reactive to light.  Neck:     Thyroid: No thyroid mass or thyromegaly.     Vascular: No carotid bruit.  Cardiovascular:     Rate and Rhythm: Normal rate and regular rhythm.     Pulses: Normal pulses.     Heart sounds: Normal heart sounds. No murmur heard. Pulmonary:     Effort: Pulmonary effort is normal. No respiratory distress.     Breath sounds: Normal breath sounds. No wheezing, rhonchi or rales.  Abdominal:     General: Bowel sounds are normal. There is no distension.     Palpations: Abdomen is soft. There is no mass.     Tenderness: There is no abdominal tenderness. There is no guarding or rebound.     Hernia: No hernia is present.  Musculoskeletal:     Cervical back: Normal range of motion and neck supple. No rigidity.     Right lower leg: No edema.     Left lower leg: No edema.  Lymphadenopathy:     Cervical: No cervical adenopathy.  Skin:    General: Skin is warm and dry.     Findings: No rash.  Neurological:     General: No focal deficit present.     Mental Status: She is alert. Mental status is at baseline.  Psychiatric:        Mood and Affect: Mood normal.        Behavior: Behavior normal.       Results for orders placed or performed in visit on 08/16/23  CMP (Cancer Center only)   Collection Time: 08/16/23 11:28 AM  Result Value Ref Range   Sodium 145 135 - 145 mmol/L   Potassium 4.1 3.5 - 5.1 mmol/L   Chloride 104 98 - 111 mmol/L   CO2 33 (H) 22 - 32 mmol/L   Glucose, Bld 122 (H) 70 - 99  mg/dL   BUN 14 8 - 23 mg/dL   Creatinine 1.61 0.96 - 1.00 mg/dL   Calcium 9.7 8.9 - 04.5 mg/dL   Total Protein 7.6 6.5 - 8.1 g/dL   Albumin 4.1 3.5 - 5.0 g/dL   AST 12 (L) 15 - 41 U/L   ALT 19 0 - 44 U/L   Alkaline Phosphatase 48 38 - 126 U/L   Total Bilirubin 0.6 0.3 - 1.2 mg/dL   GFR, Estimated >40 >98 mL/min   Anion gap 8 5 - 15  CBC with Differential (Cancer Center Only)   Collection Time: 08/16/23 11:28 AM  Result Value Ref Range   WBC Count 8.2 4.0 - 10.5 K/uL   RBC 4.34 3.87 - 5.11 MIL/uL   Hemoglobin 13.1 12.0 - 15.0 g/dL   HCT 11.9 14.7 - 82.9 %   MCV 92.6 80.0 - 100.0 fL   MCH 30.2 26.0 - 34.0 pg   MCHC 32.6 30.0 - 36.0 g/dL   RDW 56.2 13.0 - 86.5 %   Platelet Count 203 150 - 400 K/uL   nRBC 0.0 0.0 - 0.2 %   Neutrophils Relative % 72 %   Neutro Abs 5.9 1.7 - 7.7 K/uL   Lymphocytes Relative 21 %   Lymphs Abs 1.8 0.7 - 4.0 K/uL   Monocytes Relative 5 %   Monocytes Absolute 0.4 0.1 - 1.0 K/uL   Eosinophils Relative 2 %   Eosinophils Absolute 0.2 0.0 - 0.5 K/uL   Basophils Relative 0 %   Basophils Absolute  0.0 0.0 - 0.1 K/uL   Immature Granulocytes 0 %   Abs Immature Granulocytes 0.03 0.00 - 0.07 K/uL  Lactate dehydrogenase (LDH)   Collection Time: 08/16/23 11:29 AM  Result Value Ref Range   LDH 220 (H) 98 - 192 U/L   *Note: Due to a large number of results and/or encounters for the requested time period, some results have not been displayed. A complete set of results can be found in Results Review.    Assessment & Plan:   Problem List Items Addressed This Visit     Advanced care planning/counseling discussion - Primary (Chronic)   Advanced directives: doesn't want prolonged life support if terminal condition. Ok with CPR and intubation if necessary. Would be ok with feeding tube. HCPOA would be sister Johnny Bridge. Needs to update - planning to meet with attorney.       History of melanoma   Rec schedule derm appt as due      Hypertension   Chronic. BP  staying elevated in office.  I suggested increasing losartan dose, she declines as she states bp traditionally well controlled.  I recommend she buy BP cuff to monitor at home - she declines. She will let me know if BP trends up at future checks including weekly wound clinic visits.       Relevant Medications   furosemide (LASIX) 40 MG tablet   losartan (COZAAR) 50 MG tablet   Other Relevant Orders   Lipid panel   Comprehensive metabolic panel   TSH   Asthma in adult   Albuterol rescue inhaler refilled - rare use.       Relevant Medications   albuterol (VENTOLIN HFA) 108 (90 Base) MCG/ACT inhaler   montelukast (SINGULAIR) 10 MG tablet   Vitamin D deficiency   Refill weekly vit D replacement, update levels      Relevant Medications   Vitamin D, Ergocalciferol, (DRISDOL) 1.25 MG (50000 UNIT) CAPS capsule   Other Relevant Orders   VITAMIN D 25 Hydroxy (Vit-D Deficiency, Fractures)   Bilateral lower extremity edema   Continue lasix 40mg  daily with 2nd dose PRN, continue potassium BID      Relevant Medications   furosemide (LASIX) 40 MG tablet   Horner syndrome   She is planning to follow up with ophthalmology specialist      Venous stasis ulcer of left lower leg with edema of left lower leg (HCC)   Sees wound clinic.       Calcium pyrophosphate arthropathy of multiple sites   Previously saw rheum for pseudogout, not recently      May-Thurner syndrome   Relevant Medications   furosemide (LASIX) 40 MG tablet   losartan (COZAAR) 50 MG tablet   Peripheral neuropathy   She plans to slowly  taper off lyrica concerned with possible cognitive effect - currently on 25mg  bid dose. Discussed tapering schedule.       Relevant Medications   pregabalin (LYRICA) 25 MG capsule   Other Relevant Orders   TSH   Low iron   Update levels of replacement.      Relevant Orders   Ferritin   IBC panel   Mild cognitive impairment   Overall stable period - thought med related  changes.  Planning to taper off lyrica once chronic venous insufficiency leg wound heals.       Hypokalemia   Continues Kdur BID, she is on lasix.      Relevant Medications   potassium chloride SA (KLOR-CON  M20) 20 MEQ tablet   Other Relevant Orders   Comprehensive metabolic panel   Chronic diarrhea   Ongoing since hospitalization 01/2022.  Pending GI f/u next month - discussed completing stool studies ordered by GI 04/2023.      Severe obesity (BMI 35.0-39.9) with comorbidity (HCC)   Encourage healthy diet and lifestyle choices to affect sustainable weight loss. Obesity complicated by hypertension.        Meds ordered this encounter  Medications   albuterol (VENTOLIN HFA) 108 (90 Base) MCG/ACT inhaler    Sig: Inhale 1-2 puffs into the lungs every 6 (six) hours as needed for wheezing or shortness of breath.    Dispense:  8.5 each    Refill:  3   furosemide (LASIX) 40 MG tablet    Sig: Take 1 tablet (40 mg total) by mouth daily. Take second dose if needed for leg swelling/weight gain    Dispense:  100 tablet    Refill:  4   losartan (COZAAR) 50 MG tablet    Sig: Take 1 tablet (50 mg total) by mouth daily.    Dispense:  90 tablet    Refill:  4   montelukast (SINGULAIR) 10 MG tablet    Sig: Take 1 tablet (10 mg total) by mouth daily.    Dispense:  90 tablet    Refill:  4   potassium chloride SA (KLOR-CON M20) 20 MEQ tablet    Sig: Take 1 tablet (20 mEq total) by mouth 2 (two) times daily.    Dispense:  180 tablet    Refill:  4   Vitamin D, Ergocalciferol, (DRISDOL) 1.25 MG (50000 UNIT) CAPS capsule    Sig: TAKE 1 CAPSULE BY MOUTH ONCE A WEEK    Dispense:  12 capsule    Refill:  4    Orders Placed This Encounter  Procedures   Lipid panel   Comprehensive metabolic panel   TSH   VITAMIN D 25 Hydroxy (Vit-D Deficiency, Fractures)   Ferritin   IBC panel    Patient Instructions  Labs today  Call [facility] center to see if you're due for repeat  mammogram.  If interested, check with pharmacy about new 2 shot shingles series (shingrix).  Call and schedule dentist and dermatologist appointments. Return as needed or in 6 months for follow up visit   Follow up plan: Return in about 6 months (around 06/02/2024), or if symptoms worsen or fail to improve, for follow up visit.  Eustaquio Boyden, MD

## 2023-12-04 NOTE — Assessment & Plan Note (Signed)
Rec schedule derm appt as due

## 2023-12-04 NOTE — Assessment & Plan Note (Signed)
Sees wound clinic.

## 2023-12-04 NOTE — Assessment & Plan Note (Deleted)
Forgot to recheck BP - advised to check at home and let me know if consistently >140/90 to titrate antihypertensive accordingly.

## 2023-12-04 NOTE — Assessment & Plan Note (Signed)
Continue lasix 40mg  daily with 2nd dose PRN, continue potassium BID

## 2023-12-04 NOTE — Assessment & Plan Note (Signed)
Ongoing since hospitalization 01/2022.  Pending GI f/u next month - discussed completing stool studies ordered by GI 04/2023.

## 2023-12-04 NOTE — Assessment & Plan Note (Signed)
She is planning to follow up with ophthalmology specialist

## 2023-12-04 NOTE — Assessment & Plan Note (Signed)
Encourage healthy diet and lifestyle choices to affect sustainable weight loss. Obesity complicated by hypertension.

## 2023-12-04 NOTE — Assessment & Plan Note (Signed)
Update levels of replacement.

## 2023-12-04 NOTE — Assessment & Plan Note (Signed)
Refill weekly vit D replacement, update levels

## 2023-12-11 ENCOUNTER — Encounter (HOSPITAL_BASED_OUTPATIENT_CLINIC_OR_DEPARTMENT_OTHER): Payer: Medicare Other | Attending: General Surgery | Admitting: General Surgery

## 2023-12-11 DIAGNOSIS — R600 Localized edema: Secondary | ICD-10-CM | POA: Insufficient documentation

## 2023-12-11 DIAGNOSIS — I83893 Varicose veins of bilateral lower extremities with other complications: Secondary | ICD-10-CM | POA: Diagnosis not present

## 2023-12-11 DIAGNOSIS — L97822 Non-pressure chronic ulcer of other part of left lower leg with fat layer exposed: Secondary | ICD-10-CM | POA: Diagnosis not present

## 2023-12-11 DIAGNOSIS — I872 Venous insufficiency (chronic) (peripheral): Secondary | ICD-10-CM | POA: Insufficient documentation

## 2023-12-11 DIAGNOSIS — G629 Polyneuropathy, unspecified: Secondary | ICD-10-CM | POA: Insufficient documentation

## 2023-12-11 DIAGNOSIS — I890 Lymphedema, not elsewhere classified: Secondary | ICD-10-CM | POA: Diagnosis not present

## 2023-12-11 DIAGNOSIS — L97222 Non-pressure chronic ulcer of left calf with fat layer exposed: Secondary | ICD-10-CM | POA: Diagnosis not present

## 2023-12-16 ENCOUNTER — Ambulatory Visit: Payer: Medicare Other | Admitting: Physician Assistant

## 2023-12-17 ENCOUNTER — Encounter (HOSPITAL_BASED_OUTPATIENT_CLINIC_OR_DEPARTMENT_OTHER): Payer: Medicare Other | Admitting: General Surgery

## 2023-12-17 ENCOUNTER — Ambulatory Visit (HOSPITAL_BASED_OUTPATIENT_CLINIC_OR_DEPARTMENT_OTHER): Payer: Medicare Other | Admitting: Pulmonary Disease

## 2023-12-17 DIAGNOSIS — I83893 Varicose veins of bilateral lower extremities with other complications: Secondary | ICD-10-CM | POA: Diagnosis not present

## 2023-12-17 DIAGNOSIS — L97822 Non-pressure chronic ulcer of other part of left lower leg with fat layer exposed: Secondary | ICD-10-CM | POA: Diagnosis not present

## 2023-12-17 DIAGNOSIS — G629 Polyneuropathy, unspecified: Secondary | ICD-10-CM | POA: Diagnosis not present

## 2023-12-17 DIAGNOSIS — I872 Venous insufficiency (chronic) (peripheral): Secondary | ICD-10-CM | POA: Diagnosis not present

## 2023-12-17 DIAGNOSIS — R600 Localized edema: Secondary | ICD-10-CM | POA: Diagnosis not present

## 2023-12-25 ENCOUNTER — Ambulatory Visit (HOSPITAL_BASED_OUTPATIENT_CLINIC_OR_DEPARTMENT_OTHER): Payer: Medicare Other | Admitting: General Surgery

## 2024-01-01 ENCOUNTER — Encounter (HOSPITAL_BASED_OUTPATIENT_CLINIC_OR_DEPARTMENT_OTHER): Payer: Medicare Other | Admitting: General Surgery

## 2024-01-01 DIAGNOSIS — I83893 Varicose veins of bilateral lower extremities with other complications: Secondary | ICD-10-CM | POA: Diagnosis not present

## 2024-01-01 DIAGNOSIS — I890 Lymphedema, not elsewhere classified: Secondary | ICD-10-CM | POA: Diagnosis not present

## 2024-01-01 DIAGNOSIS — L97222 Non-pressure chronic ulcer of left calf with fat layer exposed: Secondary | ICD-10-CM | POA: Diagnosis not present

## 2024-01-01 DIAGNOSIS — I872 Venous insufficiency (chronic) (peripheral): Secondary | ICD-10-CM | POA: Diagnosis not present

## 2024-01-01 DIAGNOSIS — R600 Localized edema: Secondary | ICD-10-CM | POA: Diagnosis not present

## 2024-01-01 DIAGNOSIS — L97822 Non-pressure chronic ulcer of other part of left lower leg with fat layer exposed: Secondary | ICD-10-CM | POA: Diagnosis not present

## 2024-01-01 DIAGNOSIS — G629 Polyneuropathy, unspecified: Secondary | ICD-10-CM | POA: Diagnosis not present

## 2024-01-08 ENCOUNTER — Encounter (HOSPITAL_BASED_OUTPATIENT_CLINIC_OR_DEPARTMENT_OTHER): Payer: Medicare Other | Attending: General Surgery | Admitting: General Surgery

## 2024-01-08 DIAGNOSIS — L97222 Non-pressure chronic ulcer of left calf with fat layer exposed: Secondary | ICD-10-CM | POA: Diagnosis not present

## 2024-01-08 DIAGNOSIS — L97822 Non-pressure chronic ulcer of other part of left lower leg with fat layer exposed: Secondary | ICD-10-CM | POA: Insufficient documentation

## 2024-01-08 DIAGNOSIS — G629 Polyneuropathy, unspecified: Secondary | ICD-10-CM | POA: Insufficient documentation

## 2024-01-08 DIAGNOSIS — R600 Localized edema: Secondary | ICD-10-CM | POA: Diagnosis not present

## 2024-01-08 DIAGNOSIS — I83893 Varicose veins of bilateral lower extremities with other complications: Secondary | ICD-10-CM | POA: Insufficient documentation

## 2024-01-08 DIAGNOSIS — I872 Venous insufficiency (chronic) (peripheral): Secondary | ICD-10-CM | POA: Diagnosis not present

## 2024-01-08 DIAGNOSIS — I890 Lymphedema, not elsewhere classified: Secondary | ICD-10-CM | POA: Diagnosis not present

## 2024-01-09 ENCOUNTER — Telehealth: Payer: Self-pay

## 2024-01-09 NOTE — Telephone Encounter (Signed)
 Received phone call from patient stating she was calling from her sister's phone as her phone was out of service. Pt stated that she was having vaginal bleeding and her OB/GYN can't see her til May. Pt very anxious stating her mom was the same age as her when she was diagnosed with uterine cancer. Pt asking for advice on how to proceed. This RN tried to call patient back multiple times with number provided. There was no answer and unable to leave a message.

## 2024-01-13 ENCOUNTER — Telehealth: Payer: Self-pay

## 2024-01-13 NOTE — Telephone Encounter (Signed)
 Received phone call from patient stating she just got the message that was left for her last week. Pt states that she is over her initial shock of vaginal bleeding and is ok. Pt states she had vaginal bleeding for 3-4 days and it ended being dark brown "like a period" pt states she did not notice any in the toilet bowl and only spotting at times. Pt states it was never heavy bleeding. Pt states she monitored her bleeding with a Poise pad.  Pt denies any abdominal cramping or pain. Pt states she has not had any vaginal bleeding since then. Pt states that she does have an appointment with Tinnie Gens with [facility] in May 2025. This RN attempted to call to have appointment moved up but unable as patient hasn't been seen since 2021 and has to have a new patient appointment. Dr. Myna Hidalgo aware of situation. Dr. Myna Hidalgo recommends patient be seen by a different OB/GYN but patient declined offered. Pt requesting that someone from our office or Dr. Myna Hidalgo reach out to her OB/GYN when she returns to office on January 20, 2024.  Dr. Myna Hidalgo aware. Pt appreciative of help and had no further concerns. Pt aware to call with any new symptoms.

## 2024-01-14 ENCOUNTER — Other Ambulatory Visit: Payer: Self-pay | Admitting: *Deleted

## 2024-01-14 DIAGNOSIS — Z85820 Personal history of malignant melanoma of skin: Secondary | ICD-10-CM

## 2024-01-14 DIAGNOSIS — N939 Abnormal uterine and vaginal bleeding, unspecified: Secondary | ICD-10-CM

## 2024-01-14 DIAGNOSIS — C439 Malignant melanoma of skin, unspecified: Secondary | ICD-10-CM

## 2024-01-16 ENCOUNTER — Encounter (HOSPITAL_BASED_OUTPATIENT_CLINIC_OR_DEPARTMENT_OTHER): Payer: Medicare Other | Admitting: Internal Medicine

## 2024-01-16 DIAGNOSIS — L97822 Non-pressure chronic ulcer of other part of left lower leg with fat layer exposed: Secondary | ICD-10-CM | POA: Diagnosis not present

## 2024-01-16 DIAGNOSIS — I83893 Varicose veins of bilateral lower extremities with other complications: Secondary | ICD-10-CM | POA: Diagnosis not present

## 2024-01-16 DIAGNOSIS — I872 Venous insufficiency (chronic) (peripheral): Secondary | ICD-10-CM | POA: Diagnosis not present

## 2024-01-16 DIAGNOSIS — G629 Polyneuropathy, unspecified: Secondary | ICD-10-CM | POA: Diagnosis not present

## 2024-01-16 DIAGNOSIS — L97222 Non-pressure chronic ulcer of left calf with fat layer exposed: Secondary | ICD-10-CM | POA: Diagnosis not present

## 2024-01-16 DIAGNOSIS — R600 Localized edema: Secondary | ICD-10-CM | POA: Diagnosis not present

## 2024-01-20 ENCOUNTER — Telehealth: Payer: Self-pay | Admitting: *Deleted

## 2024-01-20 NOTE — Telephone Encounter (Signed)
 Called Dr Teola Bradley office to let her know patient has been experiencing vaginal bleeding and has a family history of uterine cancer.  Requested that patients May appt be moved up if at all possible.  Did not get reply back but noticed that patient appt has been moved up to next week.

## 2024-01-22 ENCOUNTER — Encounter (HOSPITAL_BASED_OUTPATIENT_CLINIC_OR_DEPARTMENT_OTHER): Payer: Medicare Other | Admitting: General Surgery

## 2024-01-22 DIAGNOSIS — R600 Localized edema: Secondary | ICD-10-CM | POA: Diagnosis not present

## 2024-01-22 DIAGNOSIS — I83893 Varicose veins of bilateral lower extremities with other complications: Secondary | ICD-10-CM | POA: Diagnosis not present

## 2024-01-22 DIAGNOSIS — G629 Polyneuropathy, unspecified: Secondary | ICD-10-CM | POA: Diagnosis not present

## 2024-01-22 DIAGNOSIS — L97822 Non-pressure chronic ulcer of other part of left lower leg with fat layer exposed: Secondary | ICD-10-CM | POA: Diagnosis not present

## 2024-01-22 DIAGNOSIS — I872 Venous insufficiency (chronic) (peripheral): Secondary | ICD-10-CM | POA: Diagnosis not present

## 2024-01-22 DIAGNOSIS — L97222 Non-pressure chronic ulcer of left calf with fat layer exposed: Secondary | ICD-10-CM | POA: Diagnosis not present

## 2024-01-22 DIAGNOSIS — I890 Lymphedema, not elsewhere classified: Secondary | ICD-10-CM | POA: Diagnosis not present

## 2024-01-24 ENCOUNTER — Other Ambulatory Visit: Payer: Self-pay

## 2024-01-24 ENCOUNTER — Encounter (HOSPITAL_BASED_OUTPATIENT_CLINIC_OR_DEPARTMENT_OTHER): Payer: Self-pay | Admitting: Pulmonary Disease

## 2024-01-24 DIAGNOSIS — I871 Compression of vein: Secondary | ICD-10-CM

## 2024-01-27 ENCOUNTER — Ambulatory Visit (INDEPENDENT_AMBULATORY_CARE_PROVIDER_SITE_OTHER): Payer: Medicare Other | Admitting: Physician Assistant

## 2024-01-27 ENCOUNTER — Other Ambulatory Visit

## 2024-01-27 ENCOUNTER — Encounter: Payer: Self-pay | Admitting: Physician Assistant

## 2024-01-27 VITALS — BP 136/74 | HR 75 | Ht 60.5 in | Wt 204.6 lb

## 2024-01-27 DIAGNOSIS — R197 Diarrhea, unspecified: Secondary | ICD-10-CM

## 2024-01-27 DIAGNOSIS — K529 Noninfective gastroenteritis and colitis, unspecified: Secondary | ICD-10-CM

## 2024-01-27 NOTE — Progress Notes (Signed)
 Chief Complaint: Follow-up chronic diarrhea  HPI:    [name] is a 78 year old female with a past medical history as listed below including complication of anesthesia and DVT on Aspirin, known to Dr. Myrtie Neither, who returns to clinic today for follow-up of her chronic diarrhea.    01/06/2018 colonoscopy with decreased sphincter tone, diverticulosis in the left and right colon otherwise normal.  No repeat recommended due to age.    04/23/2022 abdominal x-ray normal.    10/24/2022 CBC, CMP normal.  Iron studies and iron percent saturation slightly decreased today 18.9, normal ferritin.    04/29/2023 office visit with me at that time discussed issues with diarrhea over the past year.  Discussed trouble with chronic venous ulcers for which she is constantly getting antibiotics.  Had recently started [facility] and her stool was starting to form up a bit.  Describes some IBS in her past.  That time ordered stool studies and discussed trial of Dicyclomine/Hyoscyamine for suspected IBS.    Of note patient never finished stool studies as ordered including pancreatic fecal elastase, calprotectin, WBC stool, ova and parasite and GI profile panel.    12/04/2023 CMP, TSH, vitamin D, ferritin all normal.    Today, the patient tells me that she continues on Florastor and her stools do have a little bit more form than they used to, but she still will wake up and have to rush to the bathroom and have mostly water in the morning.  Tells me that the urgency has decreased some.  Occasionally she will use Gas-X for seem to help a little bit as well.  Tells me she never returned her stool studies due to some issues with getting them in her current residence.    Most recently has been having some issues with yeast infections of her skin here and there.    Has moved out of her sister's house and is back in her own place which has helped some with her stress level.    Denies fever, chills, weight loss, blood in her stool or  symptoms that awaken her from sleep.  Past Medical History:  Diagnosis Date   Allergy    Anesthesia complication    trouble waking up 2/2 CPAP   Arthritis    Asthma in adult    Cataract 2019   left eye resolved with surgery   Cervical spondylosis 2012   [place] [facility] [place])   Choroidal nevus of left eye 03/22/2016   Chronic venous insufficiency 2016   severe reflux with painful varicosities sees [facility]   Clotting disorder (HCC)    due to vein ablation    Cognitive dysfunction    Complication of anesthesia    difficulty coming out due to sleep apnea per pt   COVID-19 virus infection 07/29/2023   Difficult intubation    PATIENT DENIES    Diverticulosis    per ct scans 09-2016 and 01-2017   DVT (deep venous thrombosis) (HCC) 2011   small, developed after venous ablation   DVT (deep venous thrombosis) (HCC)    Family history of adverse reaction to anesthesia    O2 levels drop upon waking    Hearing loss sensory, bilateral 2013   mod-severe high freq sensorineural ([facility])   Hiatal hernia     09-2016 ct scan [facility] at cancer center    History of kidney stones 1980s   History of radiation therapy 07/19/11-08/25/2011   RIGHT AXILLARY REGION/METASTATIC   Hypertension    Melanoma (HCC) 06/29/2010  MALIGNANT MELANOMA R SHOULDER/SUPRASCAPULAR BACK s/p interferon chemo and XRT   Neuromuscular disorder (HCC)    muscle spasms   OSA (obstructive sleep apnea)    on CPAP   Pneumonia    2001   RLS (restless legs syndrome)    Seasonal allergies    Sleep apnea    wears cpap   Tinnitus    Unspecified vitamin D deficiency 03/18/2013   Varicose veins of left lower extremity     Past Surgical History:  Procedure Laterality Date   ABI  2016   WNL   ANTERIOR CERVICAL DECOMP/DISCECTOMY FUSION N/A 08/22/2015   ANTERIOR CERVICAL DECOMPRESSION FUSION C3/4 - interbody graft and anterior plate; Barnett Abu, MD   ANTERIOR CERVICAL DECOMP/DISCECTOMY FUSION N/A 07/02/2016   Procedure:  Cervical four- five Cervical five- six Cervical six- seven Anterior cervical decompression/diskectomy/fusion;  Surgeon: Barnett Abu, MD;  Location: [facility];  Service: Neurosurgery;  Laterality: N/A;  C4-5 C5-6 C6-7 Anterior cervical decompression/diskectomy/fusion   BREAST BIOPSY Left 2013   BENIGN   BREAST SURGERY Right 1990   BX    CARDIOVASCULAR STRESS TEST  2010   normal stress test, EF 66%   CATARACT EXTRACTION W/ INTRAOCULAR LENS IMPLANT Left 2019   Dr. Dagoberto Ligas   COLONOSCOPY  03/2005   benign polyp, int hem, rpt 5 yrs ([place], [place])   COLONOSCOPY WITH PROPOFOL N/A 01/06/2018   diverticulosis, decreased sphincter tone, no f/u needed Sherrilyn Rist, MD)   CORONARY ULTRASOUND/IVUS N/A 08/03/2019   Maeola Harman, MD   DEEP AXILLARY SENTINEL NODE BIOPSY / EXCISION  2012   RIGHT   DEXA  06/2016   WNL   DILATION AND CURETTAGE OF UTERUS  2010   ENDOVENOUS ABLATION SAPHENOUS VEIN W/ LASER Left 2010   [facility]   ENDOVENOUS ABLATION SAPHENOUS VEIN W/ LASER Left 2016   accessory branch [facility])   ESOPHAGOGASTRODUODENOSCOPY  2006   mod chronic carditis, no H pylori ([place], Sharol Given)   KNEE SURGERY  1985   left   LOWER EXTREMITY VENOGRAPHY Left 08/03/2019   Maeola Harman, MD   LUMBAR LAMINECTOMY  2001   L4/5   MELANOMA EXCISION  2011   Right shoulder, with sentinel lymph node biopsy   PERIPHERAL VASCULAR INTERVENTION Left 08/03/2019   Stent of left common iliac vein with 14 x 60 mm Vici Randie Heinz, Dennard Schaumann, MD)   [facility] REMOVAL  12/05/2011   Procedure: REMOVAL PORT-A-CATH;  Surgeon: Emelia Loron, MD;  Location: [facility];  Service: General;  Laterality: N/A;  left port removal   PORTACATH PLACEMENT     left subclavian   UPPER GASTROINTESTINAL ENDOSCOPY      Current Outpatient Medications  Medication Sig Dispense Refill   acetaminophen (TYLENOL) 650 MG CR tablet Take 2 tablets (1,300 mg total) by mouth 2  (two) times daily as needed for pain.     albuterol (VENTOLIN HFA) 108 (90 Base) MCG/ACT inhaler Inhale 1-2 puffs into the lungs every 6 (six) hours as needed for wheezing or shortness of breath. 8.5 each 3   aspirin EC 81 MG tablet Take 81 mg by mouth at bedtime. Melanoma prevention     furosemide (LASIX) 40 MG tablet Take 1 tablet (40 mg total) by mouth daily. Take second dose if needed for leg swelling/weight gain 100 tablet 4   loratadine (CLARITIN) 10 MG tablet Take 10 mg by mouth daily.     losartan (COZAAR) 50 MG tablet Take 1  tablet (50 mg total) by mouth daily. 90 tablet 4   montelukast (SINGULAIR) 10 MG tablet Take 1 tablet (10 mg total) by mouth daily. 90 tablet 4   Multiple Vitamin (MULTIVITAMIN WITH MINERALS) TABS tablet Take 1 tablet by mouth daily.     phenol (CHLORASEPTIC GARGLE) 1.4 % LIQD Use as directed 1 spray in the mouth or throat as needed for throat irritation / pain. 118 mL 0   Polyvinyl Alcohol-Povidone (TEARS PLUS OP) Place 1 drop into both eyes daily as needed (dry eyes/ redness/ burning).      potassium chloride SA (KLOR-CON M20) 20 MEQ tablet Take 1 tablet (20 mEq total) by mouth 2 (two) times daily. 180 tablet 4   pregabalin (LYRICA) 25 MG capsule Take 1 capsule (25 mg total) by mouth 2 (two) times daily.     PRESCRIPTION MEDICATION CPAP     saccharomyces boulardii (FLORASTOR) 250 MG capsule Take 1 capsule (250 mg total) by mouth 2 (two) times daily.     triamcinolone (NASACORT) 55 MCG/ACT AERO nasal inhaler Place 2 sprays into the nose daily. In each nostril     Vitamin D, Ergocalciferol, (DRISDOL) 1.25 MG (50000 UNIT) CAPS capsule TAKE 1 CAPSULE BY MOUTH ONCE A WEEK 12 capsule 4   No current facility-administered medications for this visit.    Allergies as of 01/27/2024 - Review Complete 12/04/2023  Allergen Reaction Noted   Sulfa antibiotics Itching, Swelling, and Shortness Of Breath 08/16/2015   Voltaren [diclofenac sodium] Shortness Of Breath 08/16/2017    Latex Other (See Comments) 06/22/2016   Tape Other (See Comments) 08/16/2015   Doxycycline Other (See Comments) 02/01/2020   Gabapentin Other (See Comments) 11/26/2022   Other  11/22/2017   Wound dressing adhesive Rash 12/10/2017    Family History  Problem Relation Age of Onset   Cancer Mother 58       uterine   CAD Father 40       MI   Hypertension Father 34   Alzheimer's disease Father 89   Heart attack Father    Diabetes Sister    Cancer Paternal Grandmother        melanoma, possibly   Cancer Cousin        x2, breast   Colon cancer Neg Hx    Colon polyps Neg Hx    Rectal cancer Neg Hx    Stomach cancer Neg Hx     Social History   Socioeconomic History   Marital status: Divorced    Spouse name: Not on file   Number of children: 0   Years of education: Not on file   Highest education level: Some college, no degree  Occupational History   Occupation: retired    Associate Professor: RETIRED    Comment: Emergency planning/management officer   Tobacco Use   Smoking status: Former    Current packs/day: 0.00    Types: Cigarettes    Quit date: 11/05/1965    Years since quitting: 58.2    Passive exposure: Never   Smokeless tobacco: Never   Tobacco comments:    socially as a teen  Vaping Use   Vaping status: Former  Substance and Sexual Activity   Alcohol use: No    Alcohol/week: 0.0 standard drinks of alcohol   Drug use: No   Sexual activity: Never  Other Topics Concern   Not on file  Social History Narrative   L handed   01/04/22 Lives alone in RV.  Sister and husband live next door  Occupation: retired, prior worked as Emergency planning/management officer at [employer]   Edu: some college   Diet: poor - good water, rare fruits/vegetables   Social Drivers of Corporate investment banker Strain: Low Risk  (11/04/2023)   Overall Financial Resource Strain (CARDIA)    Difficulty of Paying Living Expenses: Not very hard  Food Insecurity: No Food Insecurity (11/04/2023)   Hunger Vital Sign    Worried About Running  Out of Food in the Last Year: Never true    Ran Out of Food in the Last Year: Never true  Transportation Needs: No Transportation Needs (11/04/2023)   PRAPARE - Administrator, Civil Service (Medical): No    Lack of Transportation (Non-Medical): No  Physical Activity: Sufficiently Active (11/04/2023)   Exercise Vital Sign    Days of Exercise per Week: 7 days    Minutes of Exercise per Session: 30 min  Stress: No Stress Concern Present (11/04/2023)   [employer] of Occupational Health - Occupational Stress Questionnaire    Feeling of Stress : Only a little  Social Connections: Socially Isolated (11/04/2023)   Social Connection and Isolation Panel [NHANES]    Frequency of Communication with Friends and Family: Three times a week    Frequency of Social Gatherings with Friends and Family: Three times a week    Attends Religious Services: Never    Active Member of Clubs or Organizations: No    Attends Banker Meetings: Never    Marital Status: Divorced  Catering manager Violence: Unknown (11/04/2023)   Humiliation, Afraid, Rape, and Kick questionnaire    Fear of Current or Ex-Partner: No    Emotionally Abused: No    Physically Abused: Not on file    Sexually Abused: Not on file    Review of Systems:    Constitutional: No weight loss, fever or chills Cardiovascular: No chest pain Respiratory: No SOB  Gastrointestinal: See HPI and otherwise negative   Physical Exam:  Vital signs: BP 136/74 (Cuff Size: Large)   Pulse 75   Ht 5' 0.5" (1.537 m)   Wt 204 lb 9.6 oz (92.8 kg)   BMI 39.30 kg/m    Constitutional:   Pleasant Elderly Caucasian female appears to be in NAD, Well developed, Well nourished, alert and cooperative Respiratory: Respirations even and unlabored. Lungs clear to auscultation bilaterally.   No wheezes, crackles, or rhonchi.  Cardiovascular: Normal S1, S2. No MRG. Regular rate and rhythm. No peripheral edema, cyanosis or pallor.   Gastrointestinal:  Soft, nondistended, nontender. No rebound or guarding. Normal bowel sounds. No appreciable masses or hepatomegaly. Rectal:  Not performed.  Psychiatric:  Demonstrates good judgement and reason without abnormal affect or behaviors.  RELEVANT LABS AND IMAGING: CBC    Component Value Date/Time   WBC 8.2 08/16/2023 1128   WBC 7.2 10/24/2022 1355   RBC 4.34 08/16/2023 1128   HGB 13.1 08/16/2023 1128   HGB 13.2 08/15/2017 1039   HGB 12.8 10/21/2012 0000   HCT 40.2 08/16/2023 1128   HCT 40.5 08/15/2017 1039   PLT 203 08/16/2023 1128   PLT 196 08/15/2017 1039   MCV 92.6 08/16/2023 1128   MCV 94 08/15/2017 1039   MCH 30.2 08/16/2023 1128   MCHC 32.6 08/16/2023 1128   RDW 12.7 08/16/2023 1128   RDW 13.5 08/15/2017 1039   LYMPHSABS 1.8 08/16/2023 1128   LYMPHSABS 2.0 08/15/2017 1039   MONOABS 0.4 08/16/2023 1128   EOSABS 0.2 08/16/2023 1128   EOSABS 0.2  08/15/2017 1039   BASOSABS 0.0 08/16/2023 1128   BASOSABS 0.0 08/15/2017 1039    CMP     Component Value Date/Time   NA 142 12/04/2023 1324   NA 149 (H) 08/15/2017 1039   NA 143 09/17/2016 1018   K 3.9 12/04/2023 1324   K 4.1 08/15/2017 1039   K 3.5 09/17/2016 1018   CL 104 12/04/2023 1324   CL 107 08/15/2017 1039   CO2 32 12/04/2023 1324   CO2 29 08/15/2017 1039   CO2 28 09/17/2016 1018   GLUCOSE 89 12/04/2023 1324   GLUCOSE 107 08/15/2017 1039   BUN 18 12/04/2023 1324   BUN 14 08/15/2017 1039   BUN 19.5 09/17/2016 1018   CREATININE 0.73 12/04/2023 1324   CREATININE 0.72 08/16/2023 1128   CREATININE 0.79 04/20/2022 1526   CREATININE 0.8 09/17/2016 1018   CALCIUM 9.1 12/04/2023 1324   CALCIUM 9.7 08/15/2017 1039   CALCIUM 9.8 09/17/2016 1018   PROT 6.8 12/04/2023 1324   PROT 7.9 08/15/2017 1039   PROT 7.2 09/17/2016 1018   ALBUMIN 4.1 12/04/2023 1324   ALBUMIN 4.0 08/15/2017 1039   ALBUMIN 3.6 09/17/2016 1018   AST 15 12/04/2023 1324   AST 12 (L) 08/16/2023 1128   AST 10 09/17/2016 1018    ALT 20 12/04/2023 1324   ALT 19 08/16/2023 1128   ALT 43 08/15/2017 1039   ALT 18 09/17/2016 1018   ALKPHOS 56 12/04/2023 1324   ALKPHOS 48 08/15/2017 1039   ALKPHOS 71 09/17/2016 1018   BILITOT 0.5 12/04/2023 1324   BILITOT 0.6 08/16/2023 1128   BILITOT 0.39 09/17/2016 1018   GFRNONAA >60 08/16/2023 1128   GFRAA >60 05/05/2020 1115    Assessment: 1.  Chronic diarrhea: Colonoscopy 2019 with decreased finger tone, diverticulosis, diarrhea started about a year and a half ago, patient reports a lot of antibiotics for venous ulcers and some benefit from Florastor over the past year with decrease in urgency and some increase in formed to the stool, also history of IBS in her youth, at last visit recommended stool studies to start workup but she never completed these; consider infectious cause versus IBS  Plan: 1.  Reordered stool studies including a GI path panel, O&P, fecal calprotectin, fecal leukocytes and fecal pancreatic elastase 2.  Again pending above could consider low-dose of Dicyclomine/Hyoscyamine 3.  If no benefit from the above would recommend a colonoscopy. 4.  Patient to follow in clinic after we receive her stool studies.  Hyacinth Meeker, PA-C  Gastroenterology 01/27/2024, 2:40 PM  Cc: Eustaquio Boyden, MD

## 2024-01-27 NOTE — Patient Instructions (Signed)
 Your provider has requested that you go to the basement level for lab work before leaving today. Press "B" on the elevator. The lab is located at the first door on the left as you exit the elevator.  _______________________________________________________  If your blood pressure at your visit was 140/90 or greater, please contact your primary care physician to follow up on this.  _______________________________________________________  If you are age 78 or older, your body mass index should be between 23-30. Your Body mass index is 39.3 kg/m. If this is out of the aforementioned range listed, please consider follow up with your Primary Care Provider.  If you are age 34 or younger, your body mass index should be between 19-25. Your Body mass index is 39.3 kg/m. If this is out of the aformentioned range listed, please consider follow up with your Primary Care Provider.   ________________________________________________________  The [facility] providers would like to encourage you to use [facility] to communicate with providers for non-urgent requests or questions.  Due to long hold times on the telephone, sending your provider a message by [facility] may be a faster and more efficient way to get a response.  Please allow 48 business hours for a response.  Please remember that this is for non-urgent requests.  _______________________________________________________

## 2024-01-28 ENCOUNTER — Encounter (HOSPITAL_BASED_OUTPATIENT_CLINIC_OR_DEPARTMENT_OTHER): Admitting: General Surgery

## 2024-01-28 DIAGNOSIS — L97222 Non-pressure chronic ulcer of left calf with fat layer exposed: Secondary | ICD-10-CM | POA: Diagnosis not present

## 2024-01-28 DIAGNOSIS — I83893 Varicose veins of bilateral lower extremities with other complications: Secondary | ICD-10-CM | POA: Diagnosis not present

## 2024-01-28 DIAGNOSIS — I890 Lymphedema, not elsewhere classified: Secondary | ICD-10-CM | POA: Diagnosis not present

## 2024-01-28 DIAGNOSIS — G629 Polyneuropathy, unspecified: Secondary | ICD-10-CM | POA: Diagnosis not present

## 2024-01-28 DIAGNOSIS — R600 Localized edema: Secondary | ICD-10-CM | POA: Diagnosis not present

## 2024-01-28 DIAGNOSIS — I872 Venous insufficiency (chronic) (peripheral): Secondary | ICD-10-CM | POA: Diagnosis not present

## 2024-01-28 DIAGNOSIS — L97822 Non-pressure chronic ulcer of other part of left lower leg with fat layer exposed: Secondary | ICD-10-CM | POA: Diagnosis not present

## 2024-01-28 NOTE — Progress Notes (Signed)
 ____________________________________________________________  Attending physician addendum:  Thank you for sending this case to me. I have reviewed the entire note and agree with the plan.  If her stool studies are negative, she should be scheduled for a colonoscopy with me to rule out microscopic colitis.  If her fecal elastase is low, hold off on starting pancreatic enzyme supplement pending colonoscopy result because the reported watery stool could have a dilutional effect on the specimen and give a falsely low result.  Amada Jupiter, MD  ____________________________________________________________

## 2024-01-29 ENCOUNTER — Ambulatory Visit: Admitting: Obstetrics & Gynecology

## 2024-01-29 ENCOUNTER — Encounter: Payer: Self-pay | Admitting: Obstetrics & Gynecology

## 2024-01-29 ENCOUNTER — Ambulatory Visit (HOSPITAL_BASED_OUTPATIENT_CLINIC_OR_DEPARTMENT_OTHER): Payer: Medicare Other | Admitting: General Surgery

## 2024-01-29 VITALS — BP 161/83 | HR 76 | Wt 204.0 lb

## 2024-01-29 DIAGNOSIS — N950 Postmenopausal bleeding: Secondary | ICD-10-CM

## 2024-01-29 NOTE — Progress Notes (Signed)
 New Patient:  Referred from [facility] as pt is previous Melanoma/lymph node Right upper extremity including right shoulder.  Pt stating that the PMB lasted x 4 days, denies any clots during that time, Pt stating that she is currently at the age that her mother was diagnosed with uterine cancer, Pt is also seeing vein and vascular for vascular insufficiency, Pt stating that she does have a stent in the left lower extremity. Does note that she has left lower extremity swelling and is starting to notice swelling up to the abdomen,  also sees wound clinic for the ulcers that the vascular insufficiency causes. Also notes chronic Diarrhea x 2 years, does see GI.

## 2024-01-29 NOTE — Progress Notes (Signed)
 GYNECOLOGY OFFICE VISIT NOTE  History:   [name] is a 78 y.o. G2P0020 with complex medical history here today for evaluation of episode of postmenopausal bleeding (PMB). This happened recently, lasted for 4 days, small amount of flow.  History of melanoma and diverticulosis, has chronic diarrhea. She is sure bleeding is not coming from her rectum, it is vaginal. Had previous episode in her 80s and reports having thickened endometrium on ultrasound; underwent D&C which was benign.  Had another episode a few years ago that lasted for two days, did not seek evaluation for this. She is more concerned about this episode because it lasted 4 days, and this is the same age when her mother was diagnosed with uterine cancer.  She denies any current abnormal vaginal discharge, bleeding, pelvic pain or other concerns.    Past Medical History:  Diagnosis Date   Allergy    Anesthesia complication    trouble waking up 2/2 CPAP   Arthritis    Asthma in adult    Cataract 2019   left eye resolved with surgery   Cervical spondylosis 2012   [place] [facility])   Choroidal nevus of left eye 03/22/2016   Chronic venous insufficiency 2016   severe reflux with painful varicosities sees [facility]   Clotting disorder (HCC)    due to vein ablation    Cognitive dysfunction    Complication of anesthesia    difficulty coming out due to sleep apnea per pt   COVID-19 virus infection 07/29/2023   Difficult intubation    PATIENT DENIES    Diverticulosis    per ct scans 09-2016 and 01-2017   DVT (deep venous thrombosis) (HCC) 2011   small, developed after venous ablation   DVT (deep venous thrombosis) (HCC)    Family history of adverse reaction to anesthesia    O2 levels drop upon waking    Hearing loss sensory, bilateral 2013   mod-severe high freq sensorineural ([facility])   Hiatal hernia     09-2016 ct scan HP at cancer center    History of kidney stones 1980s   History of radiation therapy  07/19/11-08/25/2011   RIGHT AXILLARY REGION/METASTATIC   Hypertension    Melanoma (HCC) 06/29/2010   MALIGNANT MELANOMA R SHOULDER/SUPRASCAPULAR BACK s/p interferon chemo and XRT   Neuromuscular disorder (HCC)    muscle spasms   OSA (obstructive sleep apnea)    on CPAP   Pneumonia    2001   RLS (restless legs syndrome)    Seasonal allergies    Sleep apnea    wears cpap   Tinnitus    Unspecified vitamin D deficiency 03/18/2013   Varicose veins of left lower extremity     Past Surgical History:  Procedure Laterality Date   ABI  2016   WNL   ANTERIOR CERVICAL DECOMP/DISCECTOMY FUSION N/A 08/22/2015   ANTERIOR CERVICAL DECOMPRESSION FUSION C3/4 - interbody graft and anterior plate; Barnett Abu, MD   ANTERIOR CERVICAL DECOMP/DISCECTOMY FUSION N/A 07/02/2016   Procedure: Cervical four- five Cervical five- six Cervical six- seven Anterior cervical decompression/diskectomy/fusion;  Surgeon: Barnett Abu, MD;  Location: [facility];  Service: Neurosurgery;  Laterality: N/A;  C4-5 C5-6 C6-7 Anterior cervical decompression/diskectomy/fusion   BREAST BIOPSY Left 2013   BENIGN   BREAST SURGERY Right 1990   BX    CARDIOVASCULAR STRESS TEST  2010   normal stress test, EF 66%   CATARACT EXTRACTION W/ INTRAOCULAR LENS IMPLANT Left 2019   Dr. Dagoberto Ligas  COLONOSCOPY  03/2005   benign polyp, int hem, rpt 5 yrs ([place], Bhatia)   COLONOSCOPY WITH PROPOFOL N/A 01/06/2018   diverticulosis, decreased sphincter tone, no f/u needed Myrtie Neither, Andreas Blower, MD)   CORONARY ULTRASOUND/IVUS N/A 08/03/2019   Maeola Harman, MD   DEEP AXILLARY SENTINEL NODE BIOPSY / EXCISION  2012   RIGHT   DEXA  06/2016   WNL   DILATION AND CURETTAGE OF UTERUS  2010   ENDOVENOUS ABLATION SAPHENOUS VEIN W/ LASER Left 2010   [facility]   ENDOVENOUS ABLATION SAPHENOUS VEIN W/ LASER Left 2016   accessory branch [facility] [place])   ESOPHAGOGASTRODUODENOSCOPY  2006   mod chronic carditis, no H pylori ([place],  Sharol Given)   KNEE SURGERY  1985   left   LOWER EXTREMITY VENOGRAPHY Left 08/03/2019   Maeola Harman, MD   LUMBAR LAMINECTOMY  2001   L4/5   MELANOMA EXCISION  2011   Right shoulder, with sentinel lymph node biopsy   PERIPHERAL VASCULAR INTERVENTION Left 08/03/2019   Stent of left common iliac vein with 14 x 60 mm Vici Randie Heinz, Dennard Schaumann, MD)   [facility] REMOVAL  12/05/2011   Procedure: REMOVAL PORT-A-CATH;  Surgeon: Emelia Loron, MD;  Location: [facility];  Service: General;  Laterality: N/A;  left port removal   PORTACATH PLACEMENT     left subclavian   UPPER GASTROINTESTINAL ENDOSCOPY     Current Outpatient Medications on File Prior to Visit  Medication Sig Dispense Refill   acetaminophen (TYLENOL) 650 MG CR tablet Take 2 tablets (1,300 mg total) by mouth 2 (two) times daily as needed for pain.     albuterol (VENTOLIN HFA) 108 (90 Base) MCG/ACT inhaler Inhale 1-2 puffs into the lungs every 6 (six) hours as needed for wheezing or shortness of breath. 8.5 each 3   aspirin EC 81 MG tablet Take 81 mg by mouth at bedtime. Melanoma prevention     furosemide (LASIX) 40 MG tablet Take 1 tablet (40 mg total) by mouth daily. Take second dose if needed for leg swelling/weight gain 100 tablet 4   loratadine (CLARITIN) 10 MG tablet Take 10 mg by mouth daily.     losartan (COZAAR) 50 MG tablet Take 1 tablet (50 mg total) by mouth daily. 90 tablet 4   montelukast (SINGULAIR) 10 MG tablet Take 1 tablet (10 mg total) by mouth daily. 90 tablet 4   Multiple Vitamin (MULTIVITAMIN WITH MINERALS) TABS tablet Take 1 tablet by mouth daily.     phenol (CHLORASEPTIC GARGLE) 1.4 % LIQD Use as directed 1 spray in the mouth or throat as needed for throat irritation / pain. 118 mL 0   Polyvinyl Alcohol-Povidone (TEARS PLUS OP) Place 1 drop into both eyes daily as needed (dry eyes/ redness/ burning).      potassium chloride SA (KLOR-CON M20) 20 MEQ tablet Take 1 tablet (20 mEq  total) by mouth 2 (two) times daily. 180 tablet 4   pregabalin (LYRICA) 25 MG capsule Take 1 capsule (25 mg total) by mouth 2 (two) times daily.     PRESCRIPTION MEDICATION CPAP     saccharomyces boulardii (FLORASTOR) 250 MG capsule Take 1 capsule (250 mg total) by mouth 2 (two) times daily.     triamcinolone (NASACORT) 55 MCG/ACT AERO nasal inhaler Place 2 sprays into the nose daily. In each nostril     Vitamin D, Ergocalciferol, (DRISDOL) 1.25 MG (50000 UNIT) CAPS capsule TAKE 1 CAPSULE BY MOUTH ONCE A  WEEK 12 capsule 4   No current facility-administered medications on file prior to visit.     The following portions of the patient's history were reviewed and updated as appropriate: allergies, current medications, past family history, past medical history, past social history, past surgical history and problem list.   Health Maintenance:  Normal mammogram on 03/20/2022.   Review of Systems:  Pertinent items noted in HPI and remainder of comprehensive ROS otherwise negative.  Physical Exam:  BP (!) 161/83   Pulse 76   Wt 204 lb (92.5 kg)   BMI 39.19 kg/m  CONSTITUTIONAL: [name], well-nourished female in no acute distress.  HEENT:  Normocephalic, atraumatic. External right and left ear normal. No scleral icterus.  NECK: Normal range of motion, supple, no masses noted on observation SKIN: No rash noted. Not diaphoretic. No erythema. No pallor. MUSCULOSKELETAL: Normal range of motion. No edema noted. NEUROLOGIC: Alert and oriented to person, place, and time. Normal muscle tone coordination. No cranial nerve deficit noted. PSYCHIATRIC: Normal mood and affect. Normal behavior. Normal judgment and thought content. CARDIOVASCULAR: Normal heart rate noted RESPIRATORY: Effort and breath sounds normal, no problems with respiration noted ABDOMEN: No masses noted. No other overt distention noted.   PELVIC: Deferred by patient today     Assessment and Plan:    1. Postmenopausal bleeding  (Primary) Discussed etiologies of postmenopausal bleeding, concern about precancerous/hyperplasia or cancerous etiology (5 to 10% percent of cases).  However, she was reassured that endometrial atrophy and endometrial polyps are the most common causes of postmenopausal bleeding.  Uterine bleeding in postmenopausal women is usually light and self-limited. Exclusion of cancer is the main objective; therefore, treatment is usually unnecessary once cancer has been excluded.  The primary goal in the diagnostic evaluation of postmenopausal women with uterine bleeding is to exclude malignancy; this can include endometrial biopsy and pelvic ultrasound.   Further diagnostic evaluation is indicated for recurrent or persistent bleeding.  Offered pelvic exam, pap and endometrial biopsy today, she wants to defer this until after ultrasound is done.  Ultrasound ordered, will follow up results and manage accordingly.  Bleeding precautions reviewed. - US PELVIC COMPLETE WITH TRANSVAGINAL; Future  Routine preventative health maintenance measures emphasized. Please refer to After Visit Summary for other counseling recommendations.   Return for after ultrasounds, for pap and pelvic exam and possible endometrial biopsy.    I spent  47  minutes dedicated to the care of this patient including pre-visit review of records, face to face time with the patient discussing her conditions and treatments, post visit ordering of medications and appropriate tests or procedures, coordinating care and documenting this visit encounter.    Jaynie Collins, MD, FACOG Obstetrician & Gynecologist, [facility] for [employer], [facility]

## 2024-02-04 ENCOUNTER — Encounter (HOSPITAL_BASED_OUTPATIENT_CLINIC_OR_DEPARTMENT_OTHER): Attending: General Surgery | Admitting: General Surgery

## 2024-02-04 DIAGNOSIS — L97822 Non-pressure chronic ulcer of other part of left lower leg with fat layer exposed: Secondary | ICD-10-CM | POA: Insufficient documentation

## 2024-02-04 DIAGNOSIS — G629 Polyneuropathy, unspecified: Secondary | ICD-10-CM | POA: Insufficient documentation

## 2024-02-04 DIAGNOSIS — L03115 Cellulitis of right lower limb: Secondary | ICD-10-CM | POA: Insufficient documentation

## 2024-02-04 DIAGNOSIS — I83893 Varicose veins of bilateral lower extremities with other complications: Secondary | ICD-10-CM | POA: Insufficient documentation

## 2024-02-04 DIAGNOSIS — R600 Localized edema: Secondary | ICD-10-CM | POA: Diagnosis not present

## 2024-02-04 DIAGNOSIS — I872 Venous insufficiency (chronic) (peripheral): Secondary | ICD-10-CM | POA: Diagnosis not present

## 2024-02-04 DIAGNOSIS — I890 Lymphedema, not elsewhere classified: Secondary | ICD-10-CM | POA: Diagnosis not present

## 2024-02-05 ENCOUNTER — Ambulatory Visit (INDEPENDENT_AMBULATORY_CARE_PROVIDER_SITE_OTHER): Payer: Medicare Other | Admitting: Physician Assistant

## 2024-02-05 ENCOUNTER — Ambulatory Visit (HOSPITAL_COMMUNITY)
Admission: RE | Admit: 2024-02-05 | Discharge: 2024-02-05 | Disposition: A | Discharge status: Home / Self Care | Payer: Medicare Other | Source: Ambulatory Visit | Attending: Vascular Surgery | Admitting: Vascular Surgery

## 2024-02-05 ENCOUNTER — Ambulatory Visit (HOSPITAL_BASED_OUTPATIENT_CLINIC_OR_DEPARTMENT_OTHER): Payer: Medicare Other | Admitting: Pulmonary Disease

## 2024-02-05 ENCOUNTER — Other Ambulatory Visit (HOSPITAL_COMMUNITY): Payer: Self-pay | Admitting: Vascular Surgery

## 2024-02-05 ENCOUNTER — Ambulatory Visit (INDEPENDENT_AMBULATORY_CARE_PROVIDER_SITE_OTHER)
Admission: RE | Admit: 2024-02-05 | Discharge: 2024-02-05 | Disposition: A | Discharge status: Home / Self Care | Source: Ambulatory Visit | Attending: Vascular Surgery | Admitting: Vascular Surgery

## 2024-02-05 VITALS — BP 160/90 | HR 53 | Temp 97.7°F | Ht 60.0 in | Wt 207.2 lb

## 2024-02-05 DIAGNOSIS — I871 Compression of vein: Secondary | ICD-10-CM

## 2024-02-05 DIAGNOSIS — M7989 Other specified soft tissue disorders: Secondary | ICD-10-CM

## 2024-02-05 NOTE — Progress Notes (Signed)
 HISTORY AND PHYSICAL     CC:  follow up Requesting Provider:  Eustaquio Boyden, MD  HPI: [name] is a 78 y.o. (15-Feb-1946) female who presents  with stent to left CIV on 08/03/2019 for LLE swelling with ulcer (May Thurner) by Dr. Randie Heinz.  She previously had left greater saphenous vein ablation however has on and off venous ulcerations of her left leg.   She was last seen on 12/12/2022 and at that time, she was active with the wound care center and undergoing unna boots to both legs.   She comes in today for follow up.  She is currently being followed in the wound center for LLE wounds with unna boot.  She states she has had some left thigh swelling for the past couple of weeks.  She states it does not improve with being in bed overnight.  She does not have swelling in the right leg.  She states that she read that Lyrica can cause some swelling.  She denies any pain in her feet.  She does not have claudication.  She does not have any wounds on her feet.   Says she has hx of melanoma and was told to take a baby asa daily to keep melanoma from recurring.   The pt is not on a statin for cholesterol management.  The pt is on a daily aspirin.   Other AC:  none The pt is on ARB, diuretic for hypertension.   The pt not diabetic.   Tobacco hx:  former    Past Medical History:  Diagnosis Date   Allergy    Anesthesia complication    trouble waking up 2/2 CPAP   Arthritis    Asthma in adult    Cataract 2019   left eye resolved with surgery   Cervical spondylosis 2012   [place] [facility])   Choroidal nevus of left eye 03/22/2016   Chronic venous insufficiency 2016   severe reflux with painful varicosities sees [facility]   Clotting disorder (HCC)    due to vein ablation    Cognitive dysfunction    Complication of anesthesia    difficulty coming out due to sleep apnea per pt   COVID-19 virus infection 07/29/2023   Difficult intubation    PATIENT DENIES    Diverticulosis    per ct scans  09-2016 and 01-2017   DVT (deep venous thrombosis) (HCC) 2011   small, developed after venous ablation   DVT (deep venous thrombosis) (HCC)    Family history of adverse reaction to anesthesia    O2 levels drop upon waking    Hearing loss sensory, bilateral 2013   mod-severe high freq sensorineural ([facility])   Hiatal hernia     09-2016 ct scan HP at cancer center    History of kidney stones 1980s   History of radiation therapy 07/19/11-08/25/2011   RIGHT AXILLARY REGION/METASTATIC   Hypertension    Melanoma (HCC) 06/29/2010   MALIGNANT MELANOMA R SHOULDER/SUPRASCAPULAR BACK s/p interferon chemo and XRT   Neuromuscular disorder (HCC)    muscle spasms   OSA (obstructive sleep apnea)    on CPAP   Pneumonia    2001   RLS (restless legs syndrome)    Seasonal allergies    Sleep apnea    wears cpap   Tinnitus    Unspecified vitamin D deficiency 03/18/2013   Varicose veins of left lower extremity     Past Surgical History:  Procedure Laterality Date   ABI  2016  WNL   ANTERIOR CERVICAL DECOMP/DISCECTOMY FUSION N/A 08/22/2015   ANTERIOR CERVICAL DECOMPRESSION FUSION C3/4 - interbody graft and anterior plate; Barnett Abu, MD   ANTERIOR CERVICAL DECOMP/DISCECTOMY FUSION N/A 07/02/2016   Procedure: Cervical four- five Cervical five- six Cervical six- seven Anterior cervical decompression/diskectomy/fusion;  Surgeon: Barnett Abu, MD;  Location: [facility];  Service: Neurosurgery;  Laterality: N/A;  C4-5 C5-6 C6-7 Anterior cervical decompression/diskectomy/fusion   BREAST BIOPSY Left 2013   BENIGN   BREAST SURGERY Right 1990   BX    CARDIOVASCULAR STRESS TEST  2010   normal stress test, EF 66%   CATARACT EXTRACTION W/ INTRAOCULAR LENS IMPLANT Left 2019   Dr. Dagoberto Ligas   COLONOSCOPY  03/2005   benign polyp, int hem, rpt 5 yrs ([place], Bhatia)   COLONOSCOPY WITH PROPOFOL N/A 01/06/2018   diverticulosis, decreased sphincter tone, no f/u needed Sherrilyn Rist, MD)    CORONARY ULTRASOUND/IVUS N/A 08/03/2019   Maeola Harman, MD   DEEP AXILLARY SENTINEL NODE BIOPSY / EXCISION  2012   RIGHT   DEXA  06/2016   WNL   DILATION AND CURETTAGE OF UTERUS  2010   ENDOVENOUS ABLATION SAPHENOUS VEIN W/ LASER Left 2010   [facility]   ENDOVENOUS ABLATION SAPHENOUS VEIN W/ LASER Left 2016   accessory branch [facility] [place])   ESOPHAGOGASTRODUODENOSCOPY  2006   mod chronic carditis, no H pylori ([place], Sharol Given)   KNEE SURGERY  1985   left   LOWER EXTREMITY VENOGRAPHY Left 08/03/2019   Maeola Harman, MD   LUMBAR LAMINECTOMY  2001   L4/5   MELANOMA EXCISION  2011   Right shoulder, with sentinel lymph node biopsy   PERIPHERAL VASCULAR INTERVENTION Left 08/03/2019   Stent of left common iliac vein with 14 x 60 mm Vici Randie Heinz, Dennard Schaumann, MD)   [facility] REMOVAL  12/05/2011   Procedure: REMOVAL PORT-A-CATH;  Surgeon: Emelia Loron, MD;  Location: [facility];  Service: General;  Laterality: N/A;  left port removal   PORTACATH PLACEMENT     left subclavian   UPPER GASTROINTESTINAL ENDOSCOPY      Social History   Socioeconomic History   Marital status: Divorced    Spouse name: Not on file   Number of children: 0   Years of education: Not on file   Highest education level: Some college, no degree  Occupational History   Occupation: retired    Associate Professor: RETIRED    Comment: Emergency planning/management officer   Tobacco Use   Smoking status: Former    Current packs/day: 0.00    Types: Cigarettes    Quit date: 11/05/1965    Years since quitting: 58.2    Passive exposure: Never   Smokeless tobacco: Never   Tobacco comments:    socially as a teen  Vaping Use   Vaping status: Former  Substance and Sexual Activity   Alcohol use: No    Alcohol/week: 0.0 standard drinks of alcohol   Drug use: No   Sexual activity: Never  Other Topics Concern   Not on file  Social History Narrative   L handed   01/04/22 Lives alone in RV.  Sister and  husband live next door   Occupation: retired, prior worked as Emergency planning/management officer at [employer]   Edu: some college   Diet: poor - good water, rare fruits/vegetables   Social Drivers of Corporate investment banker Strain: Low Risk  (11/04/2023)   Overall Financial Resource Strain (CARDIA)  Difficulty of Paying Living Expenses: Not very hard  Food Insecurity: No Food Insecurity (11/04/2023)   Hunger Vital Sign    Worried About Running Out of Food in the Last Year: Never true    Ran Out of Food in the Last Year: Never true  Transportation Needs: No Transportation Needs (11/04/2023)   PRAPARE - Administrator, Civil Service (Medical): No    Lack of Transportation (Non-Medical): No  Physical Activity: Sufficiently Active (11/04/2023)   Exercise Vital Sign    Days of Exercise per Week: 7 days    Minutes of Exercise per Session: 30 min  Stress: No Stress Concern Present (11/04/2023)   [employer] of Occupational Health - Occupational Stress Questionnaire    Feeling of Stress : Only a little  Social Connections: Socially Isolated (11/04/2023)   Social Connection and Isolation Panel [NHANES]    Frequency of Communication with Friends and Family: Three times a week    Frequency of Social Gatherings with Friends and Family: Three times a week    Attends Religious Services: Never    Active Member of Clubs or Organizations: No    Attends Banker Meetings: Never    Marital Status: Divorced  Catering manager Violence: Unknown (11/04/2023)   Humiliation, Afraid, Rape, and Kick questionnaire    Fear of Current or Ex-Partner: No    Emotionally Abused: No    Physically Abused: Not on file    Sexually Abused: Not on file     Family History  Problem Relation Age of Onset   Cancer Mother 23       uterine   CAD Father 40       MI   Hypertension Father 79   Alzheimer's disease Father 52   Heart attack Father    Diabetes Sister    Cancer Paternal Grandmother         melanoma, possibly   Cancer Cousin        x2, breast   Colon cancer Neg Hx    Colon polyps Neg Hx    Rectal cancer Neg Hx    Stomach cancer Neg Hx     Current Outpatient Medications  Medication Sig Dispense Refill   acetaminophen (TYLENOL) 650 MG CR tablet Take 2 tablets (1,300 mg total) by mouth 2 (two) times daily as needed for pain.     albuterol (VENTOLIN HFA) 108 (90 Base) MCG/ACT inhaler Inhale 1-2 puffs into the lungs every 6 (six) hours as needed for wheezing or shortness of breath. 8.5 each 3   aspirin EC 81 MG tablet Take 81 mg by mouth at bedtime. Melanoma prevention     furosemide (LASIX) 40 MG tablet Take 1 tablet (40 mg total) by mouth daily. Take second dose if needed for leg swelling/weight gain 100 tablet 4   loratadine (CLARITIN) 10 MG tablet Take 10 mg by mouth daily.     losartan (COZAAR) 50 MG tablet Take 1 tablet (50 mg total) by mouth daily. 90 tablet 4   montelukast (SINGULAIR) 10 MG tablet Take 1 tablet (10 mg total) by mouth daily. 90 tablet 4   Multiple Vitamin (MULTIVITAMIN WITH MINERALS) TABS tablet Take 1 tablet by mouth daily.     phenol (CHLORASEPTIC GARGLE) 1.4 % LIQD Use as directed 1 spray in the mouth or throat as needed for throat irritation / pain. 118 mL 0   Polyvinyl Alcohol-Povidone (TEARS PLUS OP) Place 1 drop into both eyes daily as needed (dry eyes/ redness/  burning).      potassium chloride SA (KLOR-CON M20) 20 MEQ tablet Take 1 tablet (20 mEq total) by mouth 2 (two) times daily. 180 tablet 4   pregabalin (LYRICA) 25 MG capsule Take 1 capsule (25 mg total) by mouth 2 (two) times daily.     PRESCRIPTION MEDICATION CPAP     saccharomyces boulardii (FLORASTOR) 250 MG capsule Take 1 capsule (250 mg total) by mouth 2 (two) times daily.     triamcinolone (NASACORT) 55 MCG/ACT AERO nasal inhaler Place 2 sprays into the nose daily. In each nostril     Vitamin D, Ergocalciferol, (DRISDOL) 1.25 MG (50000 UNIT) CAPS capsule TAKE 1 CAPSULE BY MOUTH ONCE A  WEEK 12 capsule 4   No current facility-administered medications for this visit.    Allergies  Allergen Reactions   Sulfa Antibiotics Itching, Swelling and Shortness Of Breath    Facial swelling   Voltaren [Diclofenac Sodium] Shortness Of Breath   Latex Other (See Comments)    Blisters ONLY HAD REACTION TO TAPE  (??? ONLY ADHESIVE ALLERGY)   Tape Other (See Comments)    Caused blisters - must use paper tape - same reaction from NeoPrene   Doxycycline Other (See Comments)    Diaphoresis and dizziness   Gabapentin Other (See Comments)    confusion, sleep a lot, and swelling in her legs   Other     Neoprene   Wound Dressing Adhesive Rash     REVIEW OF SYSTEMS:   [X]  denotes positive finding, [ ]  denotes negative finding Cardiac  Comments:  Chest pain or chest pressure:    Shortness of breath upon exertion:    Short of breath when lying flat:    Irregular heart rhythm:        Vascular    Pain in calf, thigh, or hip brought on by ambulation:    Pain in feet at night that wakes you up from your sleep:     Blood clot in your veins:    Leg swelling:  x       Pulmonary    Oxygen at home:    Productive cough:     Wheezing:         Neurologic    Sudden weakness in arms or legs:     Sudden numbness in arms or legs:     Sudden onset of difficulty speaking or slurred speech:    Temporary loss of vision in one eye:     Problems with dizziness:         Gastrointestinal    Blood in stool:     Vomited blood:         Genitourinary    Burning when urinating:     Blood in urine:        Psychiatric    Major depression:         Hematologic    Bleeding problems:    Problems with blood clotting too easily:        Skin    Rashes or ulcers:        Constitutional    Fever or chills:      PHYSICAL EXAMINATION:  Today's Vitals   02/05/24 1207  BP: (!) 160/90  Pulse: (!) 53  Temp: 97.7 F (36.5 C)  TempSrc: Temporal  SpO2: 92%  Weight: 207 lb 3.2 oz (94 kg)   Height: 5' (1.524 m)   Body mass index is 40.47 kg/m.   General:  WDWN in NAD;  vital signs documented above Gait: Not observed HENT: WNL, normocephalic Pulmonary: normal non-labored breathing Cardiac: regular HR Abdomen: obese Skin: without rashes Vascular Exam/Pulses:  Right Left  Radial 2+ (normal) 2+ (normal)  DP 1+ (weak) monophasic 1+ (weak) monophasic  PT monophasic monophasic   Extremities: +LLE swelling.  + left thigh swelling as well  Left medial lower leg   Left lateral lower leg   Musculoskeletal: no muscle wasting or atrophy  Neurologic: A&O X 3;  No focal weakness or paresthesias are detected; speech fluent/normal Psychiatric:  The pt has Normal affect.   Non-Invasive Vascular Imaging: IVC duplex on 02/05/2024 IVC/Iliac: Patent IVC, left CIV and EIV   DVT study LLE 02/05/2024: Negative for DVT left leg   ASSESSMENT/PLAN:: 78 y.o. female here for follow up for stent to left CIV on 08/03/2019 for LLE swelling with ulcer (May Thurner) by Dr. Randie Heinz.  She previously had left greater saphenous vein ablation however has on and off venous ulcerations of her left leg.    -duplex today reveals that left CIV stent is patent as well asd IVC and EIV. -she does have some right thigh swelling that has been present for a couple of weeks.  I did get a DVT study today of the left leg that is negative for DVT.  -given she continues to have some wounds on the LLE - continue unna boot per wound care center.  She is very sensitive on the lateral aspect of the left lower leg.  Will bring her back in ~ 3 months for ABI.  I do not think she would tolerate that currently.  She knows to call sooner if she develops any rest pain or non healing wounds on her feet.  -pt will follow up in one year with IVC/left iliac venous duplex.  -continue daily baby aspirin   Doreatha Massed, [facility] [phone]  Clinic MD:   Randie Heinz

## 2024-02-06 ENCOUNTER — Ambulatory Visit
Admission: RE | Admit: 2024-02-06 | Discharge: 2024-02-06 | Disposition: A | Discharge status: Home / Self Care | Source: Ambulatory Visit | Attending: Obstetrics & Gynecology | Admitting: Obstetrics & Gynecology

## 2024-02-06 DIAGNOSIS — R9389 Abnormal findings on diagnostic imaging of other specified body structures: Secondary | ICD-10-CM | POA: Diagnosis not present

## 2024-02-06 DIAGNOSIS — N950 Postmenopausal bleeding: Secondary | ICD-10-CM

## 2024-02-11 ENCOUNTER — Ambulatory Visit (INDEPENDENT_AMBULATORY_CARE_PROVIDER_SITE_OTHER): Admitting: Obstetrics & Gynecology

## 2024-02-11 ENCOUNTER — Encounter: Payer: Self-pay | Admitting: Obstetrics & Gynecology

## 2024-02-11 VITALS — BP 171/85 | HR 72 | Wt 210.0 lb

## 2024-02-11 DIAGNOSIS — R9389 Abnormal findings on diagnostic imaging of other specified body structures: Secondary | ICD-10-CM | POA: Insufficient documentation

## 2024-02-11 DIAGNOSIS — N950 Postmenopausal bleeding: Secondary | ICD-10-CM | POA: Diagnosis not present

## 2024-02-11 NOTE — Progress Notes (Signed)
 GYNECOLOGY OFFICE VISIT NOTE  History:   [name] is a 78 y.o. G2P0020 here today for follow up ultrasound results for evaluation of postmenopausal bleeding. No recurrence of bleeding.  She denies any abnormal vaginal discharge, bleeding, pelvic pain or other concerns.    Past Medical History:  Diagnosis Date   Allergy    Anesthesia complication    trouble waking up 2/2 CPAP   Arthritis    Asthma in adult    Cataract 2019   left eye resolved with surgery   Cervical spondylosis 2012   [place] [facility] [place])   Choroidal nevus of left eye 03/22/2016   Chronic venous insufficiency 2016   severe reflux with painful varicosities sees [facility]   Clotting disorder (HCC)    due to vein ablation    Cognitive dysfunction    Complication of anesthesia    difficulty coming out due to sleep apnea per pt   COVID-19 virus infection 07/29/2023   Difficult intubation    PATIENT DENIES    Diverticulosis    per ct scans 09-2016 and 01-2017   DVT (deep venous thrombosis) (HCC) 2011   small, developed after venous ablation   DVT (deep venous thrombosis) (HCC)    Family history of adverse reaction to anesthesia    O2 levels drop upon waking    Hearing loss sensory, bilateral 2013   mod-severe high freq sensorineural ([facility])   Hiatal hernia     09-2016 ct scan HP at cancer center    History of kidney stones 1980s   History of radiation therapy 07/19/11-08/25/2011   RIGHT AXILLARY REGION/METASTATIC   Hypertension    Melanoma (HCC) 06/29/2010   MALIGNANT MELANOMA R SHOULDER/SUPRASCAPULAR BACK s/p interferon chemo and XRT   Neuromuscular disorder (HCC)    muscle spasms   OSA (obstructive sleep apnea)    on CPAP   Pneumonia    2001   RLS (restless legs syndrome)    Seasonal allergies    Sleep apnea    wears cpap   Tinnitus    Unspecified vitamin D deficiency 03/18/2013   Varicose veins of left lower extremity     Past Surgical History:  Procedure Laterality Date   ABI  2016    WNL   ANTERIOR CERVICAL DECOMP/DISCECTOMY FUSION N/A 08/22/2015   ANTERIOR CERVICAL DECOMPRESSION FUSION C3/4 - interbody graft and anterior plate; Barnett Abu, MD   ANTERIOR CERVICAL DECOMP/DISCECTOMY FUSION N/A 07/02/2016   Procedure: Cervical four- five Cervical five- six Cervical six- seven Anterior cervical decompression/diskectomy/fusion;  Surgeon: Barnett Abu, MD;  Location: [facility];  Service: Neurosurgery;  Laterality: N/A;  C4-5 C5-6 C6-7 Anterior cervical decompression/diskectomy/fusion   BREAST BIOPSY Left 2013   BENIGN   BREAST SURGERY Right 1990   BX    CARDIOVASCULAR STRESS TEST  2010   normal stress test, EF 66%   CATARACT EXTRACTION W/ INTRAOCULAR LENS IMPLANT Left 2019   Dr. Dagoberto Ligas   COLONOSCOPY  03/2005   benign polyp, int hem, rpt 5 yrs ([place], [place])   COLONOSCOPY WITH PROPOFOL N/A 01/06/2018   diverticulosis, decreased sphincter tone, no f/u needed Sherrilyn Rist, MD)   CORONARY ULTRASOUND/IVUS N/A 08/03/2019   Maeola Harman, MD   DEEP AXILLARY SENTINEL NODE BIOPSY / EXCISION  2012   RIGHT   DEXA  06/2016   WNL   DILATION AND CURETTAGE OF UTERUS  2010   ENDOVENOUS ABLATION SAPHENOUS VEIN W/ LASER Left 2010   [facility]  ENDOVENOUS ABLATION SAPHENOUS VEIN W/ LASER Left 2016   accessory branch [facility] [place])   ESOPHAGOGASTRODUODENOSCOPY  2006   mod chronic carditis, no H pylori ([place], Sharol Given)   KNEE SURGERY  1985   left   LOWER EXTREMITY VENOGRAPHY Left 08/03/2019   Maeola Harman, MD   LUMBAR LAMINECTOMY  2001   L4/5   MELANOMA EXCISION  2011   Right shoulder, with sentinel lymph node biopsy   PERIPHERAL VASCULAR INTERVENTION Left 08/03/2019   Stent of left common iliac vein with 14 x 60 mm Vici Randie Heinz, Dennard Schaumann, MD)   [facility] REMOVAL  12/05/2011   Procedure: REMOVAL PORT-A-CATH;  Surgeon: Emelia Loron, MD;  Location: [facility];  Service: General;  Laterality: N/A;  left port removal    PORTACATH PLACEMENT     left subclavian   UPPER GASTROINTESTINAL ENDOSCOPY      The following portions of the patient's history were reviewed and updated as appropriate: allergies, current medications, past family history, past medical history, past social history, past surgical history and problem list.   Health Maintenance:  Normal mammogram in 03/20/2022.   Review of Systems:  Pertinent items noted in HPI and remainder of comprehensive ROS otherwise negative.  Physical Exam:  BP (!) 171/85   Pulse 72   Wt 210 lb (95.3 kg)   BMI 41.01 kg/m  CONSTITUTIONAL: [name], well-nourished female in no acute distress.  NEUROLOGIC: Alert and oriented to person, place, and time. Normal muscle tone coordination. No cranial nerve deficit noted on observation. PSYCHIATRIC: Normal mood and affect. Normal behavior. Normal judgment and thought content. CARDIOVASCULAR: Normal heart rate noted RESPIRATORY: Effort and breath sounds normal, no problems with respiration noted ABDOMEN: No masses or other overt distention noted on observation. No tenderness.   PELVIC: Deferred  Labs and Imaging US PELVIC COMPLETE WITH TRANSVAGINAL Result Date: 02/06/2024 CLINICAL DATA:  Postmenopausal bleeding EXAM: TRANSABDOMINAL AND TRANSVAGINAL ULTRASOUND OF PELVIS DOPPLER ULTRASOUND OF OVARIES TECHNIQUE: Both transabdominal and transvaginal ultrasound examinations of the pelvis were performed. Transabdominal technique was performed for global imaging of the pelvis including uterus, ovaries, adnexal regions, and pelvic cul-de-sac. It was necessary to proceed with endovaginal exam following the transabdominal exam to visualize the ovaries and adnexa. Color and duplex Doppler ultrasound was utilized to evaluate blood flow to the ovaries. COMPARISON:  CT abdomen and pelvis January 09, 2017 FINDINGS: Uterus Measurements: 8.9 x 4.5 x 4.8 cm = volume: 101.6 mL. No fibroids or other mass visualized. Endometrium Thickness: 14 mm.  Thickened endometrium without evidence of local masses. Correlate clinically to rule out HRT related changes Right ovary Measurements: Not identified = volume: mL. Normal appearance/no adnexal mass. Left ovary Measurements: Not identified = volume: mL. Normal appearance/no adnexal mass. Pulsed Doppler evaluation of both ovaries demonstrates normal low-resistance arterial and venous waveforms. Other findings No abnormal free fluid. IMPRESSION: *Thickened endometrium without evidence of local masses. *Ovaries not identified. Electronically Signed   By: Shaaron Adler M.D.   On: 02/06/2024 15:24      Assessment and Plan:     1. Thickened endometrium 2. Postmenopausal bleeding (Primary) Endometrial sampling recommended given thickened endometrium. Patient declined office endometrial biopsy.  Recommended Hysteroscopy, Dilation and Curettage in the OR.  She wanted this option.  Risks of surgery were discussed with the patient including but not limited to: bleeding which may require transfusion; infection which may require antibiotics; injury to uterus or surrounding organs; need for additional procedures including laparotomy or laparoscopy; and other postoperative/anesthesia  complications. Patient was told that the likelihood that her condition and symptoms will be evaluated effectively with this surgical management was very high; the postoperative expectations were also discussed in detail. The patient also understands the alternative treatment options which were discussed in full. All questions were answered.  She was told that she will be contacted by our surgical scheduler regarding the time and date of her surgery which she was told can be up to 4 months from now; routine preoperative instructions will be given to her by the preoperative nursing team.  Printed patient education handouts about the procedure were given to the patient to review at home. - Ambulatory Referral For Surgery Scheduling  Routine  preventative health maintenance measures emphasized. Please refer to After Visit Summary for other counseling recommendations.   Return for follow up as recommended.    I spent 30 minutes dedicated to the care of this patient including pre-visit review of records, face to face time with the patient discussing her conditions and treatments, post visit ordering of medications and appropriate tests or procedures, coordinating care and documenting this visit encounter.    Jaynie Collins, MD, FACOG Obstetrician & Gynecologist, [facility] for [employer], [facility]

## 2024-02-12 ENCOUNTER — Encounter (HOSPITAL_BASED_OUTPATIENT_CLINIC_OR_DEPARTMENT_OTHER): Admitting: General Surgery

## 2024-02-12 DIAGNOSIS — R600 Localized edema: Secondary | ICD-10-CM | POA: Diagnosis not present

## 2024-02-12 DIAGNOSIS — I890 Lymphedema, not elsewhere classified: Secondary | ICD-10-CM | POA: Diagnosis not present

## 2024-02-12 DIAGNOSIS — L03115 Cellulitis of right lower limb: Secondary | ICD-10-CM | POA: Diagnosis not present

## 2024-02-12 DIAGNOSIS — L97822 Non-pressure chronic ulcer of other part of left lower leg with fat layer exposed: Secondary | ICD-10-CM | POA: Diagnosis not present

## 2024-02-12 DIAGNOSIS — G629 Polyneuropathy, unspecified: Secondary | ICD-10-CM | POA: Diagnosis not present

## 2024-02-12 DIAGNOSIS — I872 Venous insufficiency (chronic) (peripheral): Secondary | ICD-10-CM | POA: Diagnosis not present

## 2024-02-12 DIAGNOSIS — I83893 Varicose veins of bilateral lower extremities with other complications: Secondary | ICD-10-CM | POA: Diagnosis not present

## 2024-02-13 ENCOUNTER — Encounter: Payer: Self-pay | Admitting: Podiatry

## 2024-02-13 ENCOUNTER — Ambulatory Visit (INDEPENDENT_AMBULATORY_CARE_PROVIDER_SITE_OTHER): Admitting: Podiatry

## 2024-02-13 DIAGNOSIS — M79674 Pain in right toe(s): Secondary | ICD-10-CM | POA: Diagnosis not present

## 2024-02-13 DIAGNOSIS — M79675 Pain in left toe(s): Secondary | ICD-10-CM | POA: Diagnosis not present

## 2024-02-13 DIAGNOSIS — B351 Tinea unguium: Secondary | ICD-10-CM | POA: Diagnosis not present

## 2024-02-13 DIAGNOSIS — G629 Polyneuropathy, unspecified: Secondary | ICD-10-CM | POA: Diagnosis not present

## 2024-02-17 ENCOUNTER — Encounter: Payer: Self-pay | Admitting: Podiatry

## 2024-02-17 NOTE — Progress Notes (Signed)
  Subjective:  Patient ID: [name], female    DOB: [birth year],  MRN: [id number]  78 y.o. female presents at risk foot care with history of peripheral neuropathy and painful, elongated thickened toenails x 10 which are symptomatic when wearing enclosed shoe gear. This interferes with his/her daily activities. Chief Complaint  Patient presents with   Nail Problem    "Nail trim"   New problem(s): None   PCP is Eustaquio Boyden, MD , and last visit was December 04, 2023.  Allergies  Allergen Reactions   Sulfa Antibiotics Itching, Swelling and Shortness Of Breath    Facial swelling   Voltaren [Diclofenac Sodium] Shortness Of Breath   Latex Other (See Comments)    Blisters ONLY HAD REACTION TO TAPE  (??? ONLY ADHESIVE ALLERGY)   Tape Other (See Comments)    Caused blisters - must use paper tape - same reaction from NeoPrene   Doxycycline Other (See Comments)    Diaphoresis and dizziness   Gabapentin Other (See Comments)    confusion, sleep a lot, and swelling in her legs   Other     Neoprene   Wound Dressing Adhesive Rash    Review of Systems: Negative except as noted in the HPI.   Objective:  [name] is a pleasant 78 y.o. female in NAD. AAO x 3.  Vascular Examination: Vascular status intact b/l with palpable pedal pulses. CFT immediate b/l. Pedal hair present. No edema. No pain with calf compression b/l. Skin temperature gradient WNL b/l. No varicosities noted. No cyanosis or clubbing noted.  Neurological Examination: Sensation grossly intact b/l with 10 gram monofilament. Vibratory sensation intact b/l.  Dermatological Examination: Pedal skin with normal turgor, texture and tone b/l. No open wounds nor interdigital macerations noted. Toenails 1-5 b/l thick, discolored, elongated with subungual debris and pain on dorsal palpation. No hyperkeratotic lesions noted b/l.   Musculoskeletal Examination: Muscle strength 5/5 to b/l LE.  No pain, crepitus noted b/l.  Palpable exostosis noted Lisfranc joint of b/l lower extremities.  Radiographs: None  Last A1c:       No data to display           Assessment:   1. Pain due to onychomycosis of toenails of both feet   2. Peripheral polyneuropathy    Plan:  Patient was evaluated and treated. All patient's and/or POA's questions/concerns addressed on today's visit. Mycotic toenails 1-5 debrided in length and girth without incident. Continue soft, supportive shoe gear daily. Report any pedal injuries to medical professional. Call office if there are any quesitons/concerns. -Patient/POA to call should there be question/concern in the interim.  Return in about 3 months (around 05/14/2024).  Freddie Breech, DPM      [place] LOCATION: [address] [place] [address]                   Office [phone]   [facility] LOCATION: [address] [place], [place] [address] Office [phone]

## 2024-02-19 ENCOUNTER — Encounter (HOSPITAL_BASED_OUTPATIENT_CLINIC_OR_DEPARTMENT_OTHER): Admitting: General Surgery

## 2024-02-19 DIAGNOSIS — L03115 Cellulitis of right lower limb: Secondary | ICD-10-CM | POA: Diagnosis not present

## 2024-02-19 DIAGNOSIS — R600 Localized edema: Secondary | ICD-10-CM | POA: Diagnosis not present

## 2024-02-19 DIAGNOSIS — I83893 Varicose veins of bilateral lower extremities with other complications: Secondary | ICD-10-CM | POA: Diagnosis not present

## 2024-02-19 DIAGNOSIS — G629 Polyneuropathy, unspecified: Secondary | ICD-10-CM | POA: Diagnosis not present

## 2024-02-19 DIAGNOSIS — I872 Venous insufficiency (chronic) (peripheral): Secondary | ICD-10-CM | POA: Diagnosis not present

## 2024-02-19 DIAGNOSIS — L97822 Non-pressure chronic ulcer of other part of left lower leg with fat layer exposed: Secondary | ICD-10-CM | POA: Diagnosis not present

## 2024-02-26 ENCOUNTER — Encounter (HOSPITAL_BASED_OUTPATIENT_CLINIC_OR_DEPARTMENT_OTHER): Admitting: General Surgery

## 2024-02-26 ENCOUNTER — Ambulatory Visit: Admitting: Internal Medicine

## 2024-02-26 DIAGNOSIS — I872 Venous insufficiency (chronic) (peripheral): Secondary | ICD-10-CM | POA: Diagnosis not present

## 2024-02-26 DIAGNOSIS — R600 Localized edema: Secondary | ICD-10-CM | POA: Diagnosis not present

## 2024-02-26 DIAGNOSIS — L97822 Non-pressure chronic ulcer of other part of left lower leg with fat layer exposed: Secondary | ICD-10-CM | POA: Diagnosis not present

## 2024-02-26 DIAGNOSIS — I83893 Varicose veins of bilateral lower extremities with other complications: Secondary | ICD-10-CM | POA: Diagnosis not present

## 2024-02-26 DIAGNOSIS — L03115 Cellulitis of right lower limb: Secondary | ICD-10-CM | POA: Diagnosis not present

## 2024-02-26 DIAGNOSIS — G629 Polyneuropathy, unspecified: Secondary | ICD-10-CM | POA: Diagnosis not present

## 2024-03-04 ENCOUNTER — Encounter (HOSPITAL_BASED_OUTPATIENT_CLINIC_OR_DEPARTMENT_OTHER): Admitting: General Surgery

## 2024-03-04 DIAGNOSIS — I83892 Varicose veins of left lower extremities with other complications: Secondary | ICD-10-CM | POA: Diagnosis not present

## 2024-03-04 DIAGNOSIS — L03115 Cellulitis of right lower limb: Secondary | ICD-10-CM | POA: Diagnosis not present

## 2024-03-04 DIAGNOSIS — I872 Venous insufficiency (chronic) (peripheral): Secondary | ICD-10-CM | POA: Diagnosis not present

## 2024-03-04 DIAGNOSIS — I83893 Varicose veins of bilateral lower extremities with other complications: Secondary | ICD-10-CM | POA: Diagnosis not present

## 2024-03-04 DIAGNOSIS — R600 Localized edema: Secondary | ICD-10-CM | POA: Diagnosis not present

## 2024-03-04 DIAGNOSIS — G629 Polyneuropathy, unspecified: Secondary | ICD-10-CM | POA: Diagnosis not present

## 2024-03-04 DIAGNOSIS — L97822 Non-pressure chronic ulcer of other part of left lower leg with fat layer exposed: Secondary | ICD-10-CM | POA: Diagnosis not present

## 2024-03-06 ENCOUNTER — Encounter: Payer: Self-pay | Admitting: Family Medicine

## 2024-03-06 ENCOUNTER — Ambulatory Visit (INDEPENDENT_AMBULATORY_CARE_PROVIDER_SITE_OTHER): Admitting: Family Medicine

## 2024-03-06 VITALS — BP 134/66 | HR 74 | Temp 98.1°F | Ht 60.0 in | Wt 209.5 lb

## 2024-03-06 DIAGNOSIS — R9389 Abnormal findings on diagnostic imaging of other specified body structures: Secondary | ICD-10-CM | POA: Diagnosis not present

## 2024-03-06 DIAGNOSIS — I83029 Varicose veins of left lower extremity with ulcer of unspecified site: Secondary | ICD-10-CM

## 2024-03-06 DIAGNOSIS — Z85820 Personal history of malignant melanoma of skin: Secondary | ICD-10-CM

## 2024-03-06 DIAGNOSIS — K529 Noninfective gastroenteritis and colitis, unspecified: Secondary | ICD-10-CM | POA: Diagnosis not present

## 2024-03-06 DIAGNOSIS — I871 Compression of vein: Secondary | ICD-10-CM | POA: Diagnosis not present

## 2024-03-06 DIAGNOSIS — J301 Allergic rhinitis due to pollen: Secondary | ICD-10-CM | POA: Diagnosis not present

## 2024-03-06 DIAGNOSIS — R600 Localized edema: Secondary | ICD-10-CM

## 2024-03-06 DIAGNOSIS — L97929 Non-pressure chronic ulcer of unspecified part of left lower leg with unspecified severity: Secondary | ICD-10-CM

## 2024-03-06 DIAGNOSIS — I83892 Varicose veins of left lower extremities with other complications: Secondary | ICD-10-CM

## 2024-03-06 DIAGNOSIS — M4013 Other secondary kyphosis, cervicothoracic region: Secondary | ICD-10-CM | POA: Diagnosis not present

## 2024-03-06 DIAGNOSIS — Z6837 Body mass index (BMI) 37.0-37.9, adult: Secondary | ICD-10-CM | POA: Diagnosis not present

## 2024-03-06 DIAGNOSIS — M7989 Other specified soft tissue disorders: Secondary | ICD-10-CM

## 2024-03-06 DIAGNOSIS — M5416 Radiculopathy, lumbar region: Secondary | ICD-10-CM | POA: Diagnosis not present

## 2024-03-06 NOTE — Patient Instructions (Addendum)
 I will order CT abd/pelvis for further evaluation.  Kidney check today  Ok to stop singulair  (montelukast ) for now, monitor both effect on allergy and cognition / mood off this medicine.

## 2024-03-06 NOTE — Progress Notes (Unsigned)
 Ph: [phone] Fax: [phone]   Patient ID: [name], female    DOB: [birth year], 78 y.o.   MRN: [id number]  This visit was conducted in person.  BP 134/66   Pulse 74   Temp 98.1 F (36.7 C) (Oral)   Ht 5' (1.524 m)   Wt 209 lb 8 oz (95 kg)   SpO2 94%   BMI 40.92 kg/m    CC: check leg swelling Subjective:   HPI: [name] is a 78 y.o. female presenting on 03/06/2024 for Leg Swelling (C/o upper L leg swelling. Started about 4 wks ago. Denies pain.)   4 wk h/o L entire leg swelling from ankle to the groin, feet not affected.  Venous US  negative for DVT 02/05/2024.  Venous US  IVC/left Iliac 02/05/2024 - functioning left iliac vein stent.  "IVC/Iliac: Patent IVC, left CIV and EIV"  Denies groin or pelvic pain or swelling.  No fevers/chills, lymphadenopathy, night sweats, unexpected weight loss, fatigue.  No abdominal pain, nausea/vomiting,  Chronic diarrhea - see below.   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 H/o greater saphenous vein ablation. Continues unna boot for this. Planned ABIs in 2 months through [facility].  Saw GYN For thickened endometrium - rec endometrial biopsy pending in OR.   Chronic diarrhea - saw [facility] with pending labs, then consideration for colonoscopy.   Swa Dr Ellery Guthrie - nothing neurological to cause chronic left leg swelling.      Relevant past medical, surgical, family and social history reviewed and updated as indicated. Interim medical history since our last visit reviewed. Allergies and medications reviewed and updated. Outpatient Medications Prior to Visit  Medication Sig Dispense Refill   acetaminophen  (TYLENOL ) 650 MG CR tablet Take 2 tablets (1,300 mg total) by mouth 2 (two) times daily as needed for pain.     albuterol  (VENTOLIN  HFA) 108 (90 Base) MCG/ACT inhaler Inhale 1-2 puffs into the lungs every 6 (six) hours as needed for wheezing or shortness of  breath. 8.5 each 3   aspirin  EC 81 MG tablet Take 81 mg by mouth at bedtime. Melanoma prevention     furosemide  (LASIX ) 40 MG tablet Take 1 tablet (40 mg total) by mouth daily. Take second dose if needed for leg swelling/weight gain 100 tablet 4   loratadine  (CLARITIN ) 10 MG tablet Take 10 mg by mouth daily.     losartan  (COZAAR ) 50 MG tablet Take 1 tablet (50 mg total) by mouth daily. 90 tablet 4   montelukast  (SINGULAIR ) 10 MG tablet Take 1 tablet (10 mg total) by mouth daily. 90 tablet 4   Multiple Vitamin (MULTIVITAMIN WITH MINERALS) TABS tablet Take 1 tablet by mouth daily.     phenol (CHLORASEPTIC GARGLE) 1.4 % LIQD Use as directed 1 spray in the mouth or throat as needed for throat irritation / pain. 118 mL 0   Polyvinyl Alcohol-Povidone (TEARS PLUS OP) Place 1 drop into both eyes daily as needed (dry eyes/ redness/ burning).      potassium chloride  SA (KLOR-CON  M20) 20 MEQ tablet Take 1 tablet (20 mEq total) by mouth 2 (two) times daily. 180 tablet 4   pregabalin  (LYRICA ) 25 MG capsule Take 1 capsule (25 mg total) by mouth 2 (two) times daily.     PRESCRIPTION MEDICATION CPAP     saccharomyces boulardii (FLORASTOR) 250 MG capsule Take 1 capsule (250 mg total) by mouth 2 (two) times  daily.     triamcinolone  (NASACORT ) 55 MCG/ACT AERO nasal inhaler Place 2 sprays into the nose daily. In each nostril     Vitamin D , Ergocalciferol , (DRISDOL ) 1.25 MG (50000 UNIT) CAPS capsule TAKE 1 CAPSULE BY MOUTH ONCE A WEEK 12 capsule 4   No facility-administered medications prior to visit.     Per HPI unless specifically indicated in ROS section below Review of Systems  Objective:  BP 134/66   Pulse 74   Temp 98.1 F (36.7 C) (Oral)   Ht 5' (1.524 m)   Wt 209 lb 8 oz (95 kg)   SpO2 94%   BMI 40.92 kg/m   Wt Readings from Last 3 Encounters:  03/06/24 209 lb 8 oz (95 kg)  02/11/24 210 lb (95.3 kg)  02/05/24 207 lb 3.2 oz (94 kg)      Physical Exam Vitals and nursing note reviewed.   Constitutional:      Appearance: Normal appearance. She is not ill-appearing.  HENT:     Mouth/Throat:     Mouth: Mucous membranes are moist.     Pharynx: Oropharynx is clear. No oropharyngeal exudate or posterior oropharyngeal erythema.  Abdominal:     General: Bowel sounds are normal. There is no distension.     Palpations: Abdomen is soft. There is no mass.     Tenderness: There is no abdominal tenderness. There is no guarding or rebound.     Hernia: No hernia is present.  Musculoskeletal:        General: Normal range of motion.     Right lower leg: Edema present.     Left lower leg: Edema present.     Comments:  No pain with seated SLR bilaterally  No groin pain with rotation of femur in hip joint LLE wrapped in compression dressing from shin to dorsal foot  Skin:    General: Skin is warm and dry.     Findings: No rash.     Comments:  R thigh diameter 57.5cm L thigh diameter 60cm  Neg stemmer bilateral feet sign.   Neurological:     Mental Status: She is alert.  Psychiatric:        Mood and Affect: Mood normal.        Behavior: Behavior normal.       Results for orders placed or performed in visit on 12/04/23  Lipid panel   Collection Time: 12/04/23  1:24 PM  Result Value Ref Range   Cholesterol 161 0 - 200 mg/dL   Triglycerides 16.1 0.0 - 149.0 mg/dL   HDL 09.60 >45.40 mg/dL   VLDL 98.1 0.0 - 19.1 mg/dL   LDL Cholesterol 91 0 - 99 mg/dL   Total CHOL/HDL Ratio 3    NonHDL 101.88   Comprehensive metabolic panel   Collection Time: 12/04/23  1:24 PM  Result Value Ref Range   Sodium 142 135 - 145 mEq/L   Potassium 3.9 3.5 - 5.1 mEq/L   Chloride 104 96 - 112 mEq/L   CO2 32 19 - 32 mEq/L   Glucose, Bld 89 70 - 99 mg/dL   BUN 18 6 - 23 mg/dL   Creatinine, Ser 4.78 0.40 - 1.20 mg/dL   Total Bilirubin 0.5 0.2 - 1.2 mg/dL   Alkaline Phosphatase 56 39 - 117 U/L   AST 15 0 - 37 U/L   ALT 20 0 - 35 U/L   Total Protein 6.8 6.0 - 8.3 g/dL   Albumin 4.1 3.5 - 5.2  g/dL   GFR 82.95 >62.13 mL/min   Calcium 9.1 8.4 - 10.5 mg/dL  TSH   Collection Time: 12/04/23  1:24 PM  Result Value Ref Range   TSH 2.21 0.35 - 5.50 uIU/mL  VITAMIN D  25 Hydroxy (Vit-D Deficiency, Fractures)   Collection Time: 12/04/23  1:24 PM  Result Value Ref Range   VITD 54.19 30.00 - 100.00 ng/mL  Ferritin   Collection Time: 12/04/23  1:24 PM  Result Value Ref Range   Ferritin 52.7 10.0 - 291.0 ng/mL  IBC panel   Collection Time: 12/04/23  1:24 PM  Result Value Ref Range   Iron 61 42 - 145 ug/dL   Transferrin 086.5 784.6 - 360.0 mg/dL   Saturation Ratios 96.2 (L) 20.0 - 50.0 %   TIBC 317.8 250.0 - 450.0 mcg/dL   *Note: Due to a large number of results and/or encounters for the requested time period, some results have not been displayed. A complete set of results can be found in Results Review.    Assessment & Plan:   Problem List Items Addressed This Visit     History of melanoma   Relevant Orders   Lactate Dehydrogenase   CT ABDOMEN PELVIS W CONTRAST   CBC with Differential/Platelet   Comprehensive metabolic panel with GFR   Venous stasis ulcer of left lower leg with edema of left lower leg (HCC)   May-Thurner syndrome   Relevant Orders   CT ABDOMEN PELVIS W CONTRAST   Chronic diarrhea   Relevant Orders   CT ABDOMEN PELVIS W CONTRAST   Other Visit Diagnoses       Left leg swelling    -  Primary   Relevant Orders   Lactate Dehydrogenase   CT ABDOMEN PELVIS W CONTRAST   CBC with Differential/Platelet   Comprehensive metabolic panel with GFR        No orders of the defined types were placed in this encounter.   Orders Placed This Encounter  Procedures   CT ABDOMEN PELVIS W CONTRAST    Standing Status:   Future    Expiration Date:   03/06/2025    If indicated for the ordered procedure, I authorize the administration of contrast media per Radiology protocol:   Yes    Does the patient have a contrast media/X-ray dye allergy?:   No    Preferred imaging  location?:   [facility]    If indicated for the ordered procedure, I authorize the administration of oral contrast media per Radiology protocol:   Yes   Lactate Dehydrogenase   CBC with Differential/Platelet   Comprehensive metabolic panel with GFR    Patient Instructions  I will order CT abd/pelvis for further evaluation.  Kidney check today  Ok to stop singulair  (montelukast ) for now, monitor both effect on allergy and cognition / mood off this medicine.   Follow up plan: No follow-ups on file.  Claire Crick, MD

## 2024-03-07 LAB — CBC WITH DIFFERENTIAL/PLATELET
Absolute Lymphocytes: 2112 {cells}/uL (ref 850–3900)
Absolute Monocytes: 313 {cells}/uL (ref 200–950)
Basophils Absolute: 12 {cells}/uL (ref 0–200)
Basophils Relative: 0.2 %
Eosinophils Absolute: 230 {cells}/uL (ref 15–500)
Eosinophils Relative: 3.9 %
HCT: 36.8 % (ref 35.0–45.0)
Hemoglobin: 12.2 g/dL (ref 11.7–15.5)
MCH: 30.2 pg (ref 27.0–33.0)
MCHC: 33.2 g/dL (ref 32.0–36.0)
MCV: 91.1 fL (ref 80.0–100.0)
MPV: 10.3 fL (ref 7.5–12.5)
Monocytes Relative: 5.3 %
Neutro Abs: 3233 {cells}/uL (ref 1500–7800)
Neutrophils Relative %: 54.8 %
Platelets: 173 10*3/uL (ref 140–400)
RBC: 4.04 10*6/uL (ref 3.80–5.10)
RDW: 12.5 % (ref 11.0–15.0)
Total Lymphocyte: 35.8 %
WBC: 5.9 10*3/uL (ref 3.8–10.8)

## 2024-03-07 LAB — COMPREHENSIVE METABOLIC PANEL WITH GFR
AG Ratio: 1.3 (calc) (ref 1.0–2.5)
ALT: 14 U/L (ref 6–29)
AST: 12 U/L (ref 10–35)
Albumin: 3.9 g/dL (ref 3.6–5.1)
Alkaline phosphatase (APISO): 48 U/L (ref 37–153)
BUN: 24 mg/dL (ref 7–25)
CO2: 28 mmol/L (ref 20–32)
Calcium: 9.5 mg/dL (ref 8.6–10.4)
Chloride: 107 mmol/L (ref 98–110)
Creat: 0.81 mg/dL (ref 0.60–1.00)
Globulin: 3 g/dL (ref 1.9–3.7)
Glucose, Bld: 127 mg/dL — ABNORMAL HIGH (ref 65–99)
Potassium: 3.8 mmol/L (ref 3.5–5.3)
Sodium: 144 mmol/L (ref 135–146)
Total Bilirubin: 0.4 mg/dL (ref 0.2–1.2)
Total Protein: 6.9 g/dL (ref 6.1–8.1)
eGFR: 75 mL/min/{1.73_m2} (ref >=60)

## 2024-03-07 LAB — LACTATE DEHYDROGENASE: LDH: 166 U/L (ref 120–250)

## 2024-03-09 ENCOUNTER — Telehealth: Payer: Self-pay | Admitting: Family Medicine

## 2024-03-09 ENCOUNTER — Encounter: Admitting: Family Medicine

## 2024-03-09 DIAGNOSIS — M7989 Other specified soft tissue disorders: Secondary | ICD-10-CM | POA: Insufficient documentation

## 2024-03-09 NOTE — Assessment & Plan Note (Addendum)
 Appreciate GYN care, planning endometrial biopsy in the OR.

## 2024-03-09 NOTE — Assessment & Plan Note (Signed)
 H/o this with positive lymph node involvement 2012 ([place])  No evidence of disease to date. Sees onc Q1 year

## 2024-03-09 NOTE — Assessment & Plan Note (Signed)
 Predominant concern.  Entire left leg swelling noted over the past month from groin to ankle, recent workup reassuring including venous ultrasound negative for DVT and showing patent functioning left iliac vein stent.  She saw her neurosurgeon without cause found. Denies significant pain, lymphadenopathy, constitutional symptoms, abdominal pain.  She does have chronic diarrhea which is being worked up by gastroenterology. Update labs today. Will further evaluate with contrasted CT abdomen and pelvis to rule out mass or other obstructing cause of left leg swelling.

## 2024-03-09 NOTE — Assessment & Plan Note (Signed)
 She is on singulair  10mg  nightly, claritin  10mg  daily, and nasacort .  She is concerned about possible psychoactive/mood side effects of singulair  and desires to try off this medication.

## 2024-03-09 NOTE — Telephone Encounter (Signed)
 Copied from CRM [id number]. Topic: Clinical - Lab/Test Results >> Mar 09, 2024 11:53 AM [place] E wrote: Reason for CRM: Patient returned a call regarding lab results. Relayed to patient the note that PCP attached to these results and patient did not have any further questions.

## 2024-03-09 NOTE — Assessment & Plan Note (Signed)
 Chronic issue, managed by wound clinic.

## 2024-03-09 NOTE — Telephone Encounter (Signed)
 Opened in error

## 2024-03-09 NOTE — Telephone Encounter (Signed)
 Noted.

## 2024-03-09 NOTE — Assessment & Plan Note (Addendum)
 Followed by [facility] s/p L common iliac vein stenting.

## 2024-03-09 NOTE — Assessment & Plan Note (Signed)
 Followed by GI, pending stool study results discussing colonoscopy.

## 2024-03-12 ENCOUNTER — Encounter (HOSPITAL_BASED_OUTPATIENT_CLINIC_OR_DEPARTMENT_OTHER): Attending: Internal Medicine | Admitting: Internal Medicine

## 2024-03-12 DIAGNOSIS — I872 Venous insufficiency (chronic) (peripheral): Secondary | ICD-10-CM | POA: Insufficient documentation

## 2024-03-12 DIAGNOSIS — L97822 Non-pressure chronic ulcer of other part of left lower leg with fat layer exposed: Secondary | ICD-10-CM | POA: Insufficient documentation

## 2024-03-12 DIAGNOSIS — L03115 Cellulitis of right lower limb: Secondary | ICD-10-CM | POA: Insufficient documentation

## 2024-03-12 DIAGNOSIS — G629 Polyneuropathy, unspecified: Secondary | ICD-10-CM | POA: Insufficient documentation

## 2024-03-12 DIAGNOSIS — I83893 Varicose veins of bilateral lower extremities with other complications: Secondary | ICD-10-CM | POA: Insufficient documentation

## 2024-03-12 DIAGNOSIS — R600 Localized edema: Secondary | ICD-10-CM | POA: Insufficient documentation

## 2024-03-17 ENCOUNTER — Other Ambulatory Visit: Payer: Self-pay | Admitting: Family Medicine

## 2024-03-17 ENCOUNTER — Encounter: Payer: Self-pay | Admitting: Family Medicine

## 2024-03-17 DIAGNOSIS — J4520 Mild intermittent asthma, uncomplicated: Secondary | ICD-10-CM

## 2024-03-17 DIAGNOSIS — E876 Hypokalemia: Secondary | ICD-10-CM

## 2024-03-18 ENCOUNTER — Other Ambulatory Visit

## 2024-03-18 ENCOUNTER — Encounter (HOSPITAL_BASED_OUTPATIENT_CLINIC_OR_DEPARTMENT_OTHER): Admitting: Internal Medicine

## 2024-03-18 DIAGNOSIS — R197 Diarrhea, unspecified: Secondary | ICD-10-CM | POA: Diagnosis not present

## 2024-03-18 DIAGNOSIS — R600 Localized edema: Secondary | ICD-10-CM | POA: Diagnosis not present

## 2024-03-18 DIAGNOSIS — G629 Polyneuropathy, unspecified: Secondary | ICD-10-CM | POA: Diagnosis not present

## 2024-03-18 DIAGNOSIS — L03115 Cellulitis of right lower limb: Secondary | ICD-10-CM | POA: Diagnosis not present

## 2024-03-18 DIAGNOSIS — K529 Noninfective gastroenteritis and colitis, unspecified: Secondary | ICD-10-CM | POA: Diagnosis not present

## 2024-03-18 DIAGNOSIS — L97822 Non-pressure chronic ulcer of other part of left lower leg with fat layer exposed: Secondary | ICD-10-CM | POA: Diagnosis not present

## 2024-03-18 DIAGNOSIS — I83893 Varicose veins of bilateral lower extremities with other complications: Secondary | ICD-10-CM | POA: Diagnosis not present

## 2024-03-18 DIAGNOSIS — I872 Venous insufficiency (chronic) (peripheral): Secondary | ICD-10-CM | POA: Diagnosis not present

## 2024-03-18 NOTE — Telephone Encounter (Signed)
 E-scribed albuterol  inh  Potassium rx sent 12/04/23, #180/4 refills to [facility] [place]. Too soon for refill. Denied request.

## 2024-03-18 NOTE — Addendum Note (Signed)
 Addended by: Bayard Bottcher B on: 03/18/2024 04:36 PM   Modules accepted: Orders

## 2024-03-18 NOTE — Addendum Note (Signed)
 Addended by: Bayard Bottcher B on: 03/18/2024 04:30 PM   Modules accepted: Orders

## 2024-03-19 ENCOUNTER — Inpatient Hospital Stay: Admission: RE | Admit: 2024-03-19 | Source: Ambulatory Visit

## 2024-03-19 LAB — GI PROFILE, STOOL, PCR
Adenovirus F 40/41: NOT DETECTED
Astrovirus: NOT DETECTED
C difficile toxin A/B: NOT DETECTED
Campylobacter: NOT DETECTED
Cryptosporidium: NOT DETECTED
Cyclospora cayetanensis: NOT DETECTED
Entamoeba histolytica: NOT DETECTED
Enteroaggregative E coli: NOT DETECTED
Enteropathogenic E coli: NOT DETECTED
Enterotoxigenic E coli: NOT DETECTED
Giardia lamblia: NOT DETECTED
Norovirus GI/GII: NOT DETECTED
Plesiomonas shigelloides: NOT DETECTED
Rotavirus A: NOT DETECTED
Salmonella: NOT DETECTED
Sapovirus: NOT DETECTED
Shiga-toxin-producing E coli: NOT DETECTED
Shigella/Enteroinvasive E coli: NOT DETECTED
Vibrio cholerae: NOT DETECTED
Vibrio: NOT DETECTED
Yersinia enterocolitica: NOT DETECTED

## 2024-03-20 ENCOUNTER — Telehealth: Payer: Self-pay

## 2024-03-20 NOTE — Telephone Encounter (Signed)
 I called patient to see if she was available for surgery w/ Dr. Thurmon [place] on 05/12/24. I left a voicemail asking patient to call me back to [phone].

## 2024-03-23 ENCOUNTER — Ambulatory Visit
Admission: RE | Admit: 2024-03-23 | Discharge: 2024-03-23 | Disposition: A | Discharge status: Home / Self Care | Source: Ambulatory Visit | Attending: Family Medicine | Admitting: Family Medicine

## 2024-03-23 DIAGNOSIS — R197 Diarrhea, unspecified: Secondary | ICD-10-CM | POA: Diagnosis not present

## 2024-03-23 DIAGNOSIS — M7989 Other specified soft tissue disorders: Secondary | ICD-10-CM

## 2024-03-23 DIAGNOSIS — K529 Noninfective gastroenteritis and colitis, unspecified: Secondary | ICD-10-CM

## 2024-03-23 DIAGNOSIS — I871 Compression of vein: Secondary | ICD-10-CM

## 2024-03-23 DIAGNOSIS — Z85820 Personal history of malignant melanoma of skin: Secondary | ICD-10-CM

## 2024-03-23 LAB — OVA AND PARASITE EXAMINATION
CONCENTRATE RESULT:: NONE SEEN
MICRO NUMBER:: 16454657
SPECIMEN QUALITY:: ADEQUATE
TRICHROME RESULT:: NONE SEEN

## 2024-03-23 MED ORDER — IOPAMIDOL (ISOVUE-300) INJECTION 61%
100.0000 mL | Freq: Once | INTRAVENOUS | Status: AC | PRN
  Administered 2024-03-23: 100 mL via INTRAVENOUS

## 2024-03-24 ENCOUNTER — Encounter: Payer: Self-pay | Admitting: Family Medicine

## 2024-03-24 ENCOUNTER — Ambulatory Visit: Payer: Self-pay | Admitting: Family Medicine

## 2024-03-24 DIAGNOSIS — E2839 Other primary ovarian failure: Secondary | ICD-10-CM

## 2024-03-24 DIAGNOSIS — Z8781 Personal history of (healed) traumatic fracture: Secondary | ICD-10-CM | POA: Insufficient documentation

## 2024-03-25 ENCOUNTER — Encounter (HOSPITAL_BASED_OUTPATIENT_CLINIC_OR_DEPARTMENT_OTHER): Admitting: General Surgery

## 2024-03-25 DIAGNOSIS — L97822 Non-pressure chronic ulcer of other part of left lower leg with fat layer exposed: Secondary | ICD-10-CM | POA: Diagnosis not present

## 2024-03-25 DIAGNOSIS — I83893 Varicose veins of bilateral lower extremities with other complications: Secondary | ICD-10-CM | POA: Diagnosis not present

## 2024-03-25 DIAGNOSIS — G629 Polyneuropathy, unspecified: Secondary | ICD-10-CM | POA: Diagnosis not present

## 2024-03-25 DIAGNOSIS — I872 Venous insufficiency (chronic) (peripheral): Secondary | ICD-10-CM | POA: Diagnosis not present

## 2024-03-25 DIAGNOSIS — R600 Localized edema: Secondary | ICD-10-CM | POA: Diagnosis not present

## 2024-03-25 DIAGNOSIS — L03115 Cellulitis of right lower limb: Secondary | ICD-10-CM | POA: Diagnosis not present

## 2024-03-31 ENCOUNTER — Telehealth: Payer: Self-pay

## 2024-03-31 NOTE — Telephone Encounter (Signed)
Will evaluate patient

## 2024-03-31 NOTE — Telephone Encounter (Signed)
 Patient also has triage message sent to pool to have her contacted for more information.

## 2024-03-31 NOTE — Telephone Encounter (Signed)
 I spoke with [name]; [name] said for about 1 wk [name] has wheezing at base of throat on and off for 1 wk. [name] also having runny nose. No covid test done. [name] has no CP,SOB or fever. Offered [name] appt today at [facility] but [name] does not have time to get to that appt. [name] scheduled appt with Dr Cherlyn Cornet 04/01/24 at 10:40 with UC & ED precautions and [name] voiced understanding.[name] also having issues with Lymphedema in thigh and [name] has had CTs and MRIs but wonders if could be related to a tick being attached to [name]; [name] being treated by wound center for 1 year for lymphedema and [name] has appt with wound center on 04/02/24. UC & ED precautions given and [name] voiced understanding. Sending note to Dr Cherlyn Cornet.

## 2024-03-31 NOTE — Telephone Encounter (Signed)
 Spoke to pt. Scheduled her an appt at [facility].

## 2024-03-31 NOTE — Telephone Encounter (Signed)
 Copied from CRM #[id number]. Topic: General - Other >> Mar 31, 2024 10:06 AM Carlatta H wrote: Reason for CRM: Please give patient would like to be fit into the scheduled to be see for Patient has been wheezing on and off for about 1 week//

## 2024-03-31 NOTE — Telephone Encounter (Signed)
 Copied from CRM [id number]. Topic: General - Other >> Mar 31, 2024 10:54 AM Turkey A wrote: Reason for CRM: Patient called and said [facility] does not do Bone Density Tests anymore. She needs a different place-would like for [name] the nurse to contact her

## 2024-04-01 ENCOUNTER — Ambulatory Visit (INDEPENDENT_AMBULATORY_CARE_PROVIDER_SITE_OTHER): Admitting: Family Medicine

## 2024-04-01 ENCOUNTER — Encounter: Payer: Self-pay | Admitting: Family Medicine

## 2024-04-01 ENCOUNTER — Encounter (HOSPITAL_BASED_OUTPATIENT_CLINIC_OR_DEPARTMENT_OTHER): Admitting: General Surgery

## 2024-04-01 VITALS — BP 154/78 | HR 75 | Temp 97.8°F | Ht 60.0 in | Wt 213.0 lb

## 2024-04-01 DIAGNOSIS — L03115 Cellulitis of right lower limb: Secondary | ICD-10-CM | POA: Diagnosis not present

## 2024-04-01 DIAGNOSIS — J4521 Mild intermittent asthma with (acute) exacerbation: Secondary | ICD-10-CM | POA: Diagnosis not present

## 2024-04-01 DIAGNOSIS — G629 Polyneuropathy, unspecified: Secondary | ICD-10-CM | POA: Diagnosis not present

## 2024-04-01 DIAGNOSIS — I872 Venous insufficiency (chronic) (peripheral): Secondary | ICD-10-CM | POA: Diagnosis not present

## 2024-04-01 DIAGNOSIS — I83893 Varicose veins of bilateral lower extremities with other complications: Secondary | ICD-10-CM | POA: Diagnosis not present

## 2024-04-01 DIAGNOSIS — R600 Localized edema: Secondary | ICD-10-CM | POA: Diagnosis not present

## 2024-04-01 DIAGNOSIS — L97822 Non-pressure chronic ulcer of other part of left lower leg with fat layer exposed: Secondary | ICD-10-CM | POA: Diagnosis not present

## 2024-04-01 DIAGNOSIS — R062 Wheezing: Secondary | ICD-10-CM

## 2024-04-01 MED ORDER — PREDNISONE 20 MG PO TABS: 20.0000 mg | ORAL_TABLET | Freq: Every day | ORAL | 0 refills | Status: DC

## 2024-04-01 NOTE — Assessment & Plan Note (Addendum)
 Acute She has stopped Singulair  a month ago given concerns for her cognitive side effect risk. No current symptoms of viral or bacterial infection. We did discuss how stopping Singulair  might be related to some of her increase in wheeze in the last week.  She has not had an asthma exacerbation in years.  She has increased her albuterol  use. Will treat with 5-day low-dose prednisone  to see if wheezing improved and returns to baseline albuterol  use. If symptoms return after prednisone  course completed I have suggested that she may need to restart Singulair  at least during worse allergy seasons. Additionally increase in asthma may be secondary to reflux but she denies current symptoms.  If initial symptoms not improving with prednisone  she can try a trial of proton pump inhibitor.

## 2024-04-01 NOTE — Progress Notes (Signed)
 Patient ID: [name], female    DOB: [birth year], 78 y.o.   MRN: [id number]  This visit was conducted in person.  BP (!) 154/78   Pulse 75   Temp 97.8 F (36.6 C) (Oral)   Ht 5' (1.524 m)   Wt 213 lb (96.6 kg)   SpO2 99%   BMI 41.60 kg/m    CC:  Chief Complaint  Patient presents with   Wheezing    On and off for about a week     Subjective:   HPI: [name] is a 78 y.o. female patient of Dr. Mariam Shingles with history of hypertension, asthma, allergic rhinitis presenting on 04/01/2024 for Wheezing (On and off for about a week )   Date of onset: 1 week Initial symptoms included wheeze Symptoms progressed to continued wheeze... feels allergies have triggered symptoms   No nasal congestion, no ear pain, no ST.   Waking up at night with dry cough, wheeze.  No SOB with exeriton or rest.    Sick contacts:  none COVID testing:   none     She has tried to treat with albuterol  as needed every 4-6 hours ( has increased use lately given wheeze, temporarily helps).  Claritin  10 mg daily Nasacort  and  but  has take a break on Singulair  10 mg p.o. nightly given concerns of cognitive SE.     She has history of mild intermittent asthma treated with Singulair  and allergy medications. Former smoker,, remote.       Relevant past medical, surgical, family and social history reviewed and updated as indicated. Interim medical history since our last visit reviewed. Allergies and medications reviewed and updated. Outpatient Medications Prior to Visit  Medication Sig Dispense Refill   acetaminophen  (TYLENOL ) 650 MG CR tablet Take 2 tablets (1,300 mg total) by mouth 2 (two) times daily as needed for pain.     albuterol  (VENTOLIN  HFA) 108 (90 Base) MCG/ACT inhaler INHALE 1-2 PUFFS BY MOUTH EVERY 6 HOURS AS NEEDED FOR WHEEZE OR SHORTNESS OF BREATH 8.5 each 2   aspirin  EC 81 MG tablet Take 81 mg by mouth at bedtime. Melanoma prevention     furosemide  (LASIX ) 40 MG tablet Take 1  tablet (40 mg total) by mouth daily. Take second dose if needed for leg swelling/weight gain 100 tablet 4   loratadine  (CLARITIN ) 10 MG tablet Take 10 mg by mouth daily.     losartan  (COZAAR ) 50 MG tablet Take 1 tablet (50 mg total) by mouth daily. 90 tablet 4   Multiple Vitamin (MULTIVITAMIN WITH MINERALS) TABS tablet Take 1 tablet by mouth daily.     phenol (CHLORASEPTIC GARGLE) 1.4 % LIQD Use as directed 1 spray in the mouth or throat as needed for throat irritation / pain. 118 mL 0   Polyvinyl Alcohol-Povidone (TEARS PLUS OP) Place 1 drop into both eyes daily as needed (dry eyes/ redness/ burning).      potassium chloride  SA (KLOR-CON  M20) 20 MEQ tablet Take 1 tablet (20 mEq total) by mouth 2 (two) times daily. 180 tablet 4   pregabalin  (LYRICA ) 25 MG capsule Take 1 capsule (25 mg total) by mouth 2 (two) times daily.     PRESCRIPTION MEDICATION CPAP     saccharomyces boulardii (FLORASTOR) 250 MG capsule Take 1 capsule (250 mg total) by mouth 2 (two) times daily.     triamcinolone  (NASACORT ) 55 MCG/ACT AERO nasal inhaler Place 2 sprays into the nose daily. In each nostril  Vitamin D , Ergocalciferol , (DRISDOL ) 1.25 MG (50000 UNIT) CAPS capsule TAKE 1 CAPSULE BY MOUTH ONCE A WEEK 12 capsule 4   montelukast  (SINGULAIR ) 10 MG tablet Take 1 tablet (10 mg total) by mouth daily. (Patient not taking: Reported on 04/01/2024) 90 tablet 4   No facility-administered medications prior to visit.     Per HPI unless specifically indicated in ROS section below Review of Systems  Constitutional:  Negative for fatigue and fever.  HENT:  Negative for congestion.   Eyes:  Negative for pain.  Respiratory:  Negative for cough and shortness of breath.   Cardiovascular:  Negative for chest pain, palpitations and leg swelling.  Gastrointestinal:  Negative for abdominal pain.  Genitourinary:  Negative for dysuria and vaginal bleeding.  Musculoskeletal:  Negative for back pain.  Neurological:  Negative for  syncope, light-headedness and headaches.  Psychiatric/Behavioral:  Negative for dysphoric mood.    Objective:  BP (!) 154/78   Pulse 75   Temp 97.8 F (36.6 C) (Oral)   Ht 5' (1.524 m)   Wt 213 lb (96.6 kg)   SpO2 99%   BMI 41.60 kg/m   Wt Readings from Last 3 Encounters:  04/01/24 213 lb (96.6 kg)  03/06/24 209 lb 8 oz (95 kg)  02/11/24 210 lb (95.3 kg)      Physical Exam Constitutional:      General: She is not in acute distress.    Appearance: Normal appearance. She is well-developed. She is obese. She is not ill-appearing or toxic-appearing.  HENT:     Head: Normocephalic.     Right Ear: Hearing, tympanic membrane, ear canal and external ear normal. Tympanic membrane is not erythematous, retracted or bulging.     Left Ear: Hearing, tympanic membrane, ear canal and external ear normal. Tympanic membrane is not erythematous, retracted or bulging.     Nose: No mucosal edema or rhinorrhea.     Right Sinus: No maxillary sinus tenderness or frontal sinus tenderness.     Left Sinus: No maxillary sinus tenderness or frontal sinus tenderness.     Mouth/Throat:     Pharynx: Uvula midline.  Eyes:     General: Lids are normal. Lids are everted, no foreign bodies appreciated.     Conjunctiva/sclera: Conjunctivae normal.     Pupils: Pupils are equal, round, and reactive to light.  Neck:     Thyroid : No thyroid  mass or thyromegaly.     Vascular: No carotid bruit.     Trachea: Trachea normal.  Cardiovascular:     Rate and Rhythm: Normal rate and regular rhythm.     Pulses: Normal pulses.     Heart sounds: Normal heart sounds, S1 normal and S2 normal. No murmur heard.    No friction rub. No gallop.  Pulmonary:     Effort: Pulmonary effort is normal. No tachypnea or respiratory distress.     Breath sounds: Normal breath sounds. No decreased breath sounds, wheezing, rhonchi or rales.  Abdominal:     General: Bowel sounds are normal.     Palpations: Abdomen is soft.     Tenderness:  There is no abdominal tenderness.  Musculoskeletal:     Cervical back: Normal range of motion and neck supple.  Skin:    General: Skin is warm and dry.     Findings: No rash.  Neurological:     Mental Status: She is alert.  Psychiatric:        Mood and Affect: Mood is not anxious or depressed.  Speech: Speech normal.        Behavior: Behavior normal. Behavior is cooperative.        Thought Content: Thought content normal.        Judgment: Judgment normal.       Results for orders placed or performed in visit on 03/18/24  Ova and parasite examination   Collection Time: 03/18/24  3:51 PM   Specimen: Stool  Result Value Ref Range   MICRO NUMBER: [id number]    SPECIMEN QUALITY: Adequate    Source STOOL    STATUS: FINAL    CONCENTRATE RESULT: No ova or parasites seen    TRICHROME RESULT: No ova or parasites seen    COMMENT:      Routine Ova and Parasite exam may not detect some parasites that occasionally cause diarrheal illness. Cryptosporidium Antigen and/or Cyclospora and Isospora Exam may be ordered to detect these parasites. One negative sample does not necessarily rule out  the presence of a parasitic infection.  For additional information, please refer to https://education.[facility].com/faq/FAQ203 (This link is being provided for informational/ educational purposes only.)   GI Profile, Stool, PCR   Collection Time: 03/18/24  3:51 PM  Result Value Ref Range   Campylobacter Not Detected Not Detected   C difficile toxin A/B Not Detected Not Detected   Plesiomonas shigelloides Not Detected Not Detected   Salmonella Not Detected Not Detected   Vibrio Not Detected Not Detected   Vibrio cholerae Not Detected Not Detected   Yersinia enterocolitica Not Detected Not Detected   Enteroaggregative E coli Not Detected Not Detected   Enteropathogenic E coli Not Detected Not Detected   Enterotoxigenic E coli Not Detected Not Detected   Shiga-toxin-producing E coli Not  Detected Not Detected   E coli O157 Not applicable Not Detected   Shigella/Enteroinvasive E coli Not Detected Not Detected   Cryptosporidium Not Detected Not Detected   Cyclospora cayetanensis Not Detected Not Detected   Entamoeba histolytica Not Detected Not Detected   Giardia lamblia Not Detected Not Detected   Adenovirus F 40/41 Not Detected Not Detected   Astrovirus Not Detected Not Detected   Norovirus GI/GII Not Detected Not Detected   Rotavirus A Not Detected Not Detected   Sapovirus Not Detected Not Detected   *Note: Due to a large number of results and/or encounters for the requested time period, some results have not been displayed. A complete set of results can be found in Results Review.    Assessment and Plan  Wheeze  Mild intermittent extrinsic asthma with acute exacerbation Assessment & Plan: Acute She has stopped Singulair  a month ago given concerns for her cognitive side effect risk. No current symptoms of viral or bacterial infection. We did discuss how stopping Singulair  might be related to some of her increase in wheeze in the last week.  She has not had an asthma exacerbation in years.  She has increased her albuterol  use. Will treat with 5-day low-dose prednisone  to see if wheezing improved and returns to baseline albuterol  use. If symptoms return after prednisone  course completed I have suggested that she may need to restart Singulair  at least during worse allergy seasons. Additionally increase in asthma may be secondary to reflux but she denies current symptoms.  If initial symptoms not improving with prednisone  she can try a trial of proton pump inhibitor.   Other orders -     predniSONE ; Take 1 tablet (20 mg total) by mouth daily with breakfast.  Dispense: 5 tablet; Refill: 0  No follow-ups on file.   Herby Lolling, MD

## 2024-04-02 ENCOUNTER — Ambulatory Visit (HOSPITAL_BASED_OUTPATIENT_CLINIC_OR_DEPARTMENT_OTHER): Admitting: General Surgery

## 2024-04-08 ENCOUNTER — Encounter (HOSPITAL_BASED_OUTPATIENT_CLINIC_OR_DEPARTMENT_OTHER): Attending: General Surgery | Admitting: General Surgery

## 2024-04-08 ENCOUNTER — Emergency Department (HOSPITAL_BASED_OUTPATIENT_CLINIC_OR_DEPARTMENT_OTHER)
Admission: EM | Admit: 2024-04-08 | Discharge: 2024-04-08 | Disposition: A | Discharge status: Home / Self Care | Attending: Emergency Medicine | Admitting: Emergency Medicine

## 2024-04-08 ENCOUNTER — Other Ambulatory Visit: Payer: Self-pay

## 2024-04-08 ENCOUNTER — Encounter (HOSPITAL_BASED_OUTPATIENT_CLINIC_OR_DEPARTMENT_OTHER): Payer: Self-pay | Admitting: Emergency Medicine

## 2024-04-08 DIAGNOSIS — I83893 Varicose veins of bilateral lower extremities with other complications: Secondary | ICD-10-CM | POA: Insufficient documentation

## 2024-04-08 DIAGNOSIS — Z4800 Encounter for change or removal of nonsurgical wound dressing: Secondary | ICD-10-CM | POA: Insufficient documentation

## 2024-04-08 DIAGNOSIS — G629 Polyneuropathy, unspecified: Secondary | ICD-10-CM | POA: Insufficient documentation

## 2024-04-08 DIAGNOSIS — I872 Venous insufficiency (chronic) (peripheral): Secondary | ICD-10-CM | POA: Diagnosis not present

## 2024-04-08 DIAGNOSIS — Z7982 Long term (current) use of aspirin: Secondary | ICD-10-CM | POA: Diagnosis not present

## 2024-04-08 DIAGNOSIS — R600 Localized edema: Secondary | ICD-10-CM | POA: Diagnosis not present

## 2024-04-08 DIAGNOSIS — Z91040 Latex allergy status: Secondary | ICD-10-CM | POA: Insufficient documentation

## 2024-04-08 DIAGNOSIS — L97822 Non-pressure chronic ulcer of other part of left lower leg with fat layer exposed: Secondary | ICD-10-CM | POA: Diagnosis not present

## 2024-04-08 DIAGNOSIS — M79605 Pain in left leg: Secondary | ICD-10-CM | POA: Diagnosis not present

## 2024-04-08 NOTE — ED Provider Notes (Signed)
 [facility] Provider Note   CSN: [id number] Arrival date & time: 04/08/24  2042     History  Chief Complaint  Patient presents with   Dressing Change    [name] is a 78 y.o. female.  The history is provided by the patient.  Patient with history of lymphedema presents with pain in her left leg from a dressing. Patient reports she was at the wound center earlier today and had a debridement and dressing change After leaving, she started having pain and swelling due to the bandage being too tight.  No weakness or numbness in her toes. She has been getting this wound care for quite some time, but use does not have this type of pain.  No other complaints    Home Medications Prior to Admission medications   Medication Sig Start Date End Date Taking? Authorizing Provider  acetaminophen  (TYLENOL ) 650 MG CR tablet Take 2 tablets (1,300 mg total) by mouth 2 (two) times daily as needed for pain. 07/05/21   Claire Crick, MD  albuterol  (VENTOLIN  HFA) 108 [phone] Base) MCG/ACT inhaler INHALE 1-2 PUFFS BY MOUTH EVERY 6 HOURS AS NEEDED FOR WHEEZE OR SHORTNESS OF BREATH 03/18/24   Claire Crick, MD  aspirin  EC 81 MG tablet Take 81 mg by mouth at bedtime. Melanoma prevention    [provider]  furosemide  (LASIX ) 40 MG tablet Take 1 tablet (40 mg total) by mouth daily. Take second dose if needed for leg swelling/weight gain 12/04/23   Claire Crick, MD  loratadine  (CLARITIN ) 10 MG tablet Take 10 mg by mouth daily.    [provider]  losartan  (COZAAR ) 50 MG tablet Take 1 tablet (50 mg total) by mouth daily. 12/04/23   Claire Crick, MD  montelukast  (SINGULAIR ) 10 MG tablet Take 1 tablet (10 mg total) by mouth daily. Patient not taking: Reported on 04/01/2024 12/04/23   Claire Crick, MD  Multiple Vitamin (MULTIVITAMIN WITH MINERALS) TABS tablet Take 1 tablet by mouth daily.    [provider]  phenol  (CHLORASEPTIC GARGLE) 1.4 % LIQD Use as directed 1 spray in the mouth or throat as needed for throat irritation / pain. 11/07/22   Claire Crick, MD  Polyvinyl Alcohol-Povidone (TEARS PLUS OP) Place 1 drop into both eyes daily as needed (dry eyes/ redness/ burning).     [provider]  potassium chloride  SA (KLOR-CON  M20) 20 MEQ tablet Take 1 tablet (20 mEq total) by mouth 2 (two) times daily. 12/04/23   Claire Crick, MD  predniSONE  (DELTASONE ) 20 MG tablet Take 1 tablet (20 mg total) by mouth daily with breakfast. 04/01/24   Bedsole, Amy E, MD  pregabalin  (LYRICA ) 25 MG capsule Take 1 capsule (25 mg total) by mouth 2 (two) times daily. 12/04/23   Claire Crick, MD  PRESCRIPTION MEDICATION CPAP    [provider]  saccharomyces boulardii (FLORASTOR) 250 MG capsule Take 1 capsule (250 mg total) by mouth 2 (two) times daily. 10/24/22   Claire Crick, MD  triamcinolone  (NASACORT ) 55 MCG/ACT AERO nasal inhaler Place 2 sprays into the nose daily. In each nostril    [provider]  Vitamin D , Ergocalciferol , (DRISDOL ) 1.25 MG (50000 UNIT) CAPS capsule TAKE 1 CAPSULE BY MOUTH ONCE A WEEK 12/04/23   Claire Crick, MD      Allergies    Sulfa antibiotics, Voltaren  [diclofenac  sodium], Latex, Tape, Diclofenac  sodium, Doxycycline, Gabapentin , Other, and Wound dressing adhesive    Review of Systems  Review of Systems  Constitutional:  Negative for fever.  Cardiovascular:  Positive for leg swelling.    Physical Exam Updated Vital Signs BP (!) 190/78 (BP Location: Left Arm)   Pulse 71   Temp 98.3 F (36.8 C) (Oral)   Resp 17   Ht 1.524 m (5')   Wt 96.6 kg   SpO2 96%   BMI 41.60 kg/m  Physical Exam CONSTITUTIONAL: Well developed/well nourished HEAD: Normocephalic/atraumatic NEURO: Pt is awake/alert/appropriate, moves all extremitiesx4.  No facial droop.   EXTREMITIES: pulses normal/equal, full ROM Chronic edema to lower extremities.  She is able to  wiggle her toes without difficulty.  No discoloration of her feet. Bandage removed, there is edema noted, no erythema, no crepitus, distal pulses intact in the left foot SKIN: warm, color normal PSYCH: no abnormalities of mood noted, alert and oriented to situation  ED Results / Procedures / Treatments   Labs (all labs ordered are listed, but only abnormal results are displayed) Labs Reviewed - No data to display  EKG None  Radiology No results found.  Procedures Procedures    Medications Ordered in ED Medications - No data to display  ED Course/ Medical Decision Making/ A&P                                 Medical Decision Making  Patient request, bandage was completely removed.  I have instructed nursing staff to place a loose bandage over the leg and she will follow back up with the wound center        Final Clinical Impression(s) / ED Diagnoses Final diagnoses:  Peripheral edema    Rx / DC Orders ED Discharge Orders     None         Eldon Greenland, MD 04/08/24 2321

## 2024-04-08 NOTE — ED Triage Notes (Signed)
 Pt reports was seen at the wound care center today and had tx for a venous ulcer; thinks the dressing is too tight now and would like it changed

## 2024-04-09 ENCOUNTER — Inpatient Hospital Stay: Admission: RE | Admit: 2024-04-09 | Source: Ambulatory Visit

## 2024-04-09 ENCOUNTER — Encounter (HOSPITAL_BASED_OUTPATIENT_CLINIC_OR_DEPARTMENT_OTHER): Admitting: General Surgery

## 2024-04-09 DIAGNOSIS — I872 Venous insufficiency (chronic) (peripheral): Secondary | ICD-10-CM | POA: Diagnosis not present

## 2024-04-09 DIAGNOSIS — I83893 Varicose veins of bilateral lower extremities with other complications: Secondary | ICD-10-CM | POA: Diagnosis not present

## 2024-04-09 DIAGNOSIS — G629 Polyneuropathy, unspecified: Secondary | ICD-10-CM | POA: Diagnosis not present

## 2024-04-09 DIAGNOSIS — R600 Localized edema: Secondary | ICD-10-CM | POA: Diagnosis not present

## 2024-04-09 DIAGNOSIS — L97822 Non-pressure chronic ulcer of other part of left lower leg with fat layer exposed: Secondary | ICD-10-CM | POA: Diagnosis not present

## 2024-04-12 DIAGNOSIS — J069 Acute upper respiratory infection, unspecified: Secondary | ICD-10-CM | POA: Diagnosis not present

## 2024-04-12 DIAGNOSIS — J45909 Unspecified asthma, uncomplicated: Secondary | ICD-10-CM | POA: Diagnosis not present

## 2024-04-12 DIAGNOSIS — R059 Cough, unspecified: Secondary | ICD-10-CM | POA: Diagnosis not present

## 2024-04-14 ENCOUNTER — Telehealth: Payer: Self-pay

## 2024-04-14 ENCOUNTER — Telehealth (HOSPITAL_BASED_OUTPATIENT_CLINIC_OR_DEPARTMENT_OTHER): Payer: Self-pay

## 2024-04-14 NOTE — Telephone Encounter (Signed)
 Copied from CRM [id number]. Topic: Clinical - Medication Question >> Apr 14, 2024  7:41 AM Kita Perish H wrote: Reason for CRM: Patient was seen at urgent care on Sunday and prescribed Methylprednisolone 4mg  dose pack 21, was supposed to start taking medication last night 2 pills but patient states she fell asleep. Also states she was supposed to take 1 before breakfast and wants to know should she take all 3 pills this morning.  Azuri [phone]

## 2024-04-14 NOTE — Telephone Encounter (Signed)
 Copied from CRM [id number]. Topic: General - Other >> Apr 14, 2024  1:47 PM Dustin F wrote: Reason for CRM: Pt stated she went to lab primary care for wheezing and was put on 5 day supply prednisone  and felt better after taking it. Pt stated she has copy of her visit at urgent care from Sunday that she can bring with her to her appt on 6/30 with Dr. Villa Greaser to download the report to see the exam. Pt was tested for flu, covid, and rsv and was all negative. Chest xray said there was something in lower right lobe that could be developing an illness in her lung so she was placed on the prednisone .[phone] ok to leave a vm.

## 2024-04-14 NOTE — Telephone Encounter (Addendum)
 Tried to call pt-  no answer. Recommend not doubling up on med. If she misses a dose, take missed dose at next scheduled dosing and continue from there.

## 2024-04-15 ENCOUNTER — Ambulatory Visit (HOSPITAL_BASED_OUTPATIENT_CLINIC_OR_DEPARTMENT_OTHER): Admitting: General Surgery

## 2024-04-15 NOTE — Telephone Encounter (Signed)
Lvm asking pt to call back.  Need to relay Dr. G's message.  

## 2024-04-16 ENCOUNTER — Encounter (HOSPITAL_BASED_OUTPATIENT_CLINIC_OR_DEPARTMENT_OTHER): Admitting: General Surgery

## 2024-04-16 ENCOUNTER — Other Ambulatory Visit

## 2024-04-16 NOTE — Telephone Encounter (Signed)
Spoke with pt relaying Dr. G's message. Pt verbalizes understanding.  

## 2024-04-20 ENCOUNTER — Ambulatory Visit (HOSPITAL_BASED_OUTPATIENT_CLINIC_OR_DEPARTMENT_OTHER): Admitting: General Surgery

## 2024-04-20 ENCOUNTER — Other Ambulatory Visit

## 2024-04-22 ENCOUNTER — Encounter (HOSPITAL_BASED_OUTPATIENT_CLINIC_OR_DEPARTMENT_OTHER): Admitting: General Surgery

## 2024-04-22 DIAGNOSIS — I83893 Varicose veins of bilateral lower extremities with other complications: Secondary | ICD-10-CM | POA: Diagnosis not present

## 2024-04-22 DIAGNOSIS — G629 Polyneuropathy, unspecified: Secondary | ICD-10-CM | POA: Diagnosis not present

## 2024-04-22 DIAGNOSIS — L97822 Non-pressure chronic ulcer of other part of left lower leg with fat layer exposed: Secondary | ICD-10-CM | POA: Diagnosis not present

## 2024-04-22 DIAGNOSIS — I872 Venous insufficiency (chronic) (peripheral): Secondary | ICD-10-CM | POA: Diagnosis not present

## 2024-04-22 DIAGNOSIS — R600 Localized edema: Secondary | ICD-10-CM | POA: Diagnosis not present

## 2024-04-23 ENCOUNTER — Other Ambulatory Visit: Payer: Self-pay | Admitting: Vascular Surgery

## 2024-04-23 DIAGNOSIS — I872 Venous insufficiency (chronic) (peripheral): Secondary | ICD-10-CM

## 2024-04-23 DIAGNOSIS — R600 Localized edema: Secondary | ICD-10-CM

## 2024-04-23 DIAGNOSIS — I83029 Varicose veins of left lower extremity with ulcer of unspecified site: Secondary | ICD-10-CM

## 2024-04-27 ENCOUNTER — Encounter: Payer: Self-pay | Admitting: Podiatry

## 2024-04-27 ENCOUNTER — Ambulatory Visit (INDEPENDENT_AMBULATORY_CARE_PROVIDER_SITE_OTHER): Admitting: Podiatry

## 2024-04-27 DIAGNOSIS — M79674 Pain in right toe(s): Secondary | ICD-10-CM

## 2024-04-27 DIAGNOSIS — B351 Tinea unguium: Secondary | ICD-10-CM

## 2024-04-27 DIAGNOSIS — M79675 Pain in left toe(s): Secondary | ICD-10-CM | POA: Diagnosis not present

## 2024-04-27 DIAGNOSIS — G629 Polyneuropathy, unspecified: Secondary | ICD-10-CM | POA: Diagnosis not present

## 2024-04-29 ENCOUNTER — Encounter (HOSPITAL_BASED_OUTPATIENT_CLINIC_OR_DEPARTMENT_OTHER): Admitting: General Surgery

## 2024-04-29 DIAGNOSIS — R600 Localized edema: Secondary | ICD-10-CM | POA: Diagnosis not present

## 2024-04-29 DIAGNOSIS — I83893 Varicose veins of bilateral lower extremities with other complications: Secondary | ICD-10-CM | POA: Diagnosis not present

## 2024-04-29 DIAGNOSIS — I872 Venous insufficiency (chronic) (peripheral): Secondary | ICD-10-CM | POA: Diagnosis not present

## 2024-04-29 DIAGNOSIS — L97822 Non-pressure chronic ulcer of other part of left lower leg with fat layer exposed: Secondary | ICD-10-CM | POA: Diagnosis not present

## 2024-04-29 DIAGNOSIS — G629 Polyneuropathy, unspecified: Secondary | ICD-10-CM | POA: Diagnosis not present

## 2024-05-02 NOTE — Progress Notes (Signed)
  Subjective:  Patient ID: [name], female    DOB: 01-Dec-1945,  MRN: [id number]  78 y.o. female presents to clinic with  at risk foot care with history of peripheral neuropathy and painful thick toenails that are difficult to trim. Pain interferes with ambulation. Aggravating factors include wearing enclosed shoe gear. Pain is relieved with periodic professional debridement.  Chief Complaint  Patient presents with   RFC    Rm3 RFC/ not diabetic/ Dr. Rilla last visit May 2025   New problem(s): None   PCP is Rilla Baller, MD.  Allergies  Allergen Reactions   Sulfa Antibiotics Itching, Swelling and Shortness Of Breath    Facial swelling   Voltaren  [Diclofenac  Sodium] Shortness Of Breath   Latex Other (See Comments)    Blisters ONLY HAD REACTION TO TAPE  (??? ONLY ADHESIVE ALLERGY)   Tape Other (See Comments)    Caused blisters - must use paper tape - same reaction from [employer]   Diclofenac  Sodium    Doxycycline Other (See Comments)    Diaphoresis and dizziness   Gabapentin  Other (See Comments)    confusion, sleep a lot, and swelling in her legs   Other     Neoprene   Wound Dressing Adhesive Rash    Review of Systems: Negative except as noted in the HPI.   Objective:  LACINDA CURVIN is a pleasant 78 y.o. female in NAD. AAO x 3.  Vascular Examination: Vascular status intact b/l with palpable pedal pulses. CFT immediate b/l. No edema. No pain with calf compression b/l. Skin temperature gradient WNL b/l. Lower extremity compression wrap noted LLE which is clean, dry and intact.  Neurological Examination: Pt has subjective symptoms of neuropathy.Sensation grossly intact b/l with 10 gram monofilament. Vibratory sensation intact b/l.   Dermatological Examination: Pedal skin with normal turgor, texture and tone b/l. Toenails 1-5 b/l thick, discolored, elongated with subungual debris and pain on dorsal palpation.  No corns, calluses nor porokeratotic lesions  noted.  Musculoskeletal Examination: Muscle strength 5/5 to b/l LE. Palpable exostosis noted Lisfranc joint of both feet.  Radiographs: None  Last A1c:       No data to display           Assessment:   1. Pain due to onychomycosis of toenails of both feet   2. Peripheral polyneuropathy    Plan:  Consent given for treatment. Patient examined. All patient's and/or POA's questions/concerns addressed on today's visit.Toenails 1-5 debrided in length and girth without incident. Continue soft, supportive shoe gear daily. Report any pedal injuries to medical professional. Call office if there are any questions/concerns. -Patient/POA to call should there be question/concern in the interim.  Return in about 3 months (around 07/28/2024).  Delon LITTIE Merlin, DPM      [place] LOCATION: [address] [place] [address]                   Office [phone]   [facility] LOCATION: [address] [place], [place] [address] Office [phone]

## 2024-05-04 ENCOUNTER — Encounter (HOSPITAL_BASED_OUTPATIENT_CLINIC_OR_DEPARTMENT_OTHER): Payer: Self-pay | Admitting: Pulmonary Disease

## 2024-05-04 ENCOUNTER — Ambulatory Visit (HOSPITAL_BASED_OUTPATIENT_CLINIC_OR_DEPARTMENT_OTHER): Admitting: Pulmonary Disease

## 2024-05-06 ENCOUNTER — Ambulatory Visit (INDEPENDENT_AMBULATORY_CARE_PROVIDER_SITE_OTHER): Admitting: Physician Assistant

## 2024-05-06 ENCOUNTER — Ambulatory Visit (HOSPITAL_COMMUNITY)
Admission: RE | Admit: 2024-05-06 | Discharge: 2024-05-06 | Disposition: A | Discharge status: Home / Self Care | Source: Ambulatory Visit | Attending: Vascular Surgery | Admitting: Vascular Surgery

## 2024-05-06 VITALS — BP 156/75 | HR 54 | Temp 97.9°F | Ht 60.0 in | Wt 212.2 lb

## 2024-05-06 DIAGNOSIS — L97929 Non-pressure chronic ulcer of unspecified part of left lower leg with unspecified severity: Secondary | ICD-10-CM

## 2024-05-06 DIAGNOSIS — I83029 Varicose veins of left lower extremity with ulcer of unspecified site: Secondary | ICD-10-CM

## 2024-05-06 DIAGNOSIS — R600 Localized edema: Secondary | ICD-10-CM

## 2024-05-06 DIAGNOSIS — I83892 Varicose veins of left lower extremities with other complications: Secondary | ICD-10-CM | POA: Diagnosis not present

## 2024-05-06 DIAGNOSIS — I872 Venous insufficiency (chronic) (peripheral): Secondary | ICD-10-CM

## 2024-05-06 DIAGNOSIS — I739 Peripheral vascular disease, unspecified: Secondary | ICD-10-CM | POA: Diagnosis not present

## 2024-05-06 LAB — VAS US ABI WITH/WO TBI: Left ABI: 1.11

## 2024-05-07 ENCOUNTER — Encounter (HOSPITAL_BASED_OUTPATIENT_CLINIC_OR_DEPARTMENT_OTHER): Attending: General Surgery | Admitting: General Surgery

## 2024-05-07 DIAGNOSIS — R600 Localized edema: Secondary | ICD-10-CM | POA: Insufficient documentation

## 2024-05-07 DIAGNOSIS — I872 Venous insufficiency (chronic) (peripheral): Secondary | ICD-10-CM | POA: Diagnosis not present

## 2024-05-07 DIAGNOSIS — L97822 Non-pressure chronic ulcer of other part of left lower leg with fat layer exposed: Secondary | ICD-10-CM | POA: Insufficient documentation

## 2024-05-07 DIAGNOSIS — G629 Polyneuropathy, unspecified: Secondary | ICD-10-CM | POA: Diagnosis not present

## 2024-05-07 DIAGNOSIS — Z95820 Peripheral vascular angioplasty status with implants and grafts: Secondary | ICD-10-CM | POA: Diagnosis not present

## 2024-05-07 DIAGNOSIS — I83893 Varicose veins of bilateral lower extremities with other complications: Secondary | ICD-10-CM | POA: Diagnosis not present

## 2024-05-11 NOTE — Progress Notes (Signed)
 Office Note   History of Present Illness   [name] is a 78 y.o. (03/25/1946) female who presents for surveillance of PAD. She also has a history of left CIV stenting on 08/03/2019 by Dr.Cain for [facility] [place] with an active venous ulceration. She also has had on and off venous ulcerations on her left leg after previous saphenous vein ablation.   She is currently being followed by the wound care center for her left lower extremity wounds.  She is getting regular Unna boot changes.  At her last visit with our office, duplex revealed a patent left common iliac vein stent.  She says that she is doing well at today's visit.  She believes that her left leg wounds are healing well.  She denies any fevers or chills.  She also denies any claudication or rest pain.  Current Outpatient Medications  Medication Sig Dispense Refill   acetaminophen  (TYLENOL ) 650 MG CR tablet Take 2 tablets (1,300 mg total) by mouth 2 (two) times daily as needed for pain.     albuterol  (VENTOLIN  HFA) 108 (90 Base) MCG/ACT inhaler INHALE 1-2 PUFFS BY MOUTH EVERY 6 HOURS AS NEEDED FOR WHEEZE OR SHORTNESS OF BREATH 8.5 each 2   aspirin  EC 81 MG tablet Take 81 mg by mouth at bedtime. Melanoma prevention     furosemide  (LASIX ) 40 MG tablet Take 1 tablet (40 mg total) by mouth daily. Take second dose if needed for leg swelling/weight gain 100 tablet 4   loratadine  (CLARITIN ) 10 MG tablet Take 10 mg by mouth daily.     losartan  (COZAAR ) 50 MG tablet Take 1 tablet (50 mg total) by mouth daily. 90 tablet 4   Multiple Vitamin (MULTIVITAMIN WITH MINERALS) TABS tablet Take 1 tablet by mouth daily.     phenol (CHLORASEPTIC GARGLE) 1.4 % LIQD Use as directed 1 spray in the mouth or throat as needed for throat irritation / pain. 118 mL 0   Polyvinyl Alcohol-Povidone (TEARS PLUS OP) Place 1 drop into both eyes daily as needed (dry eyes/ redness/ burning).      potassium chloride  SA (KLOR-CON  M20) 20 MEQ tablet Take 1 tablet (20 mEq  total) by mouth 2 (two) times daily. 180 tablet 4   predniSONE  (DELTASONE ) 20 MG tablet Take 1 tablet (20 mg total) by mouth daily with breakfast. 5 tablet 0   pregabalin  (LYRICA ) 25 MG capsule Take 1 capsule (25 mg total) by mouth 2 (two) times daily.     PRESCRIPTION MEDICATION CPAP     saccharomyces boulardii (FLORASTOR) 250 MG capsule Take 1 capsule (250 mg total) by mouth 2 (two) times daily.     triamcinolone  (NASACORT ) 55 MCG/ACT AERO nasal inhaler Place 2 sprays into the nose daily. In each nostril     Vitamin D , Ergocalciferol , (DRISDOL ) 1.25 MG (50000 UNIT) CAPS capsule TAKE 1 CAPSULE BY MOUTH ONCE A WEEK 12 capsule 4   montelukast  (SINGULAIR ) 10 MG tablet Take 1 tablet (10 mg total) by mouth daily. (Patient not taking: Reported on 05/06/2024) 90 tablet 4   No current facility-administered medications for this visit.    REVIEW OF SYSTEMS (negative unless checked):   Cardiac:  []  Chest pain or chest pressure? []  Shortness of breath upon activity? []  Shortness of breath when lying flat? []  Irregular heart rhythm?  Vascular:  []  Pain in calf, thigh, or hip brought on by walking? []  Pain in feet at night that wakes you up from your sleep? []  Blood clot  in your veins? [x]  Leg swelling?  Pulmonary:  []  Oxygen  at home? []  Productive cough? []  Wheezing?  Neurologic:  []  Sudden weakness in arms or legs? []  Sudden numbness in arms or legs? []  Sudden onset of difficult speaking or slurred speech? []  Temporary loss of vision in one eye? []  Problems with dizziness?  Gastrointestinal:  []  Blood in stool? []  Vomited blood?  Genitourinary:  []  Burning when urinating? []  Blood in urine?  Psychiatric:  []  Major depression  Hematologic:  []  Bleeding problems? []  Problems with blood clotting?  Dermatologic:  []  Rashes or ulcers?  Constitutional:  []  Fever or chills?  Ear/Nose/Throat:  []  Change in hearing? []  Nose bleeds? []  Sore throat?  Musculoskeletal:  []  Back  pain? []  Joint pain? []  Muscle pain?   Physical Examination   Vitals:   05/06/24 1456  BP: (!) 156/75  Pulse: (!) 54  Temp: 97.9 F (36.6 C)  TempSrc: Temporal  SpO2: 94%  Weight: 212 lb 3.2 oz (96.3 kg)  Height: 5' (1.524 m)   Body mass index is 41.44 kg/m.  General:  WDWN in NAD; vital signs documented above Gait: Not observed HENT: WNL, normocephalic Pulmonary: normal non-labored breathing , without rales, rhonchi,  wheezing Cardiac: regular Abdomen: soft, NT, no masses Skin: without rashes Vascular Exam/Pulses: brisk bilateral DP/PT doppler signals Extremities: healing venous ulcerations on the LLE Musculoskeletal: no muscle wasting or atrophy  Neurologic: A&O X 3;  No focal weakness or paresthesias are detected Psychiatric:  The pt has Normal affect.  Non-Invasive Vascular imaging   ABI (05/06/2024) R:  ABI: [place] (1.23),  PT: tri DP: tri TBI:  0.64 L:  ABI: 1.11 (1.34),  PT: bi DP: bi TBI: 1.39   Medical Decision Making   [name] is a 78 y.o. female who presents for surveillance of PAD  Based on the patient's vascular studies, her ABIs are noncompressible on the right and 1.11 on the left.  She has triphasic tibial vessel flow on the right and biphasic tibial vessel flow on the left She denies any rest pain, arterial ulcerations, or claudication She believes that her left lower extremity venous ulcerations are healing well under the care of the wound care center On exam she has brisk DP/PT Doppler signals bilaterally At this time the patient's blood flow appears to be good enough for continued wound healing.  She does not require any arterial intervention.  She will continue to receive wound care from the wound care center. She can follow-up with our office in 1 year with repeat ABIs and left iliac vein duplex   Ahmed Holster PA-C [facility] Office: [phone]  Call MD: Se[place]

## 2024-05-13 ENCOUNTER — Ambulatory Visit (HOSPITAL_BASED_OUTPATIENT_CLINIC_OR_DEPARTMENT_OTHER): Admitting: General Surgery

## 2024-05-14 ENCOUNTER — Ambulatory Visit (HOSPITAL_BASED_OUTPATIENT_CLINIC_OR_DEPARTMENT_OTHER): Admitting: Primary Care

## 2024-05-14 ENCOUNTER — Ambulatory Visit (HOSPITAL_BASED_OUTPATIENT_CLINIC_OR_DEPARTMENT_OTHER): Admitting: General Surgery

## 2024-05-20 ENCOUNTER — Encounter (HOSPITAL_BASED_OUTPATIENT_CLINIC_OR_DEPARTMENT_OTHER): Admitting: General Surgery

## 2024-05-20 DIAGNOSIS — R600 Localized edema: Secondary | ICD-10-CM | POA: Diagnosis not present

## 2024-05-20 DIAGNOSIS — G629 Polyneuropathy, unspecified: Secondary | ICD-10-CM | POA: Diagnosis not present

## 2024-05-20 DIAGNOSIS — I872 Venous insufficiency (chronic) (peripheral): Secondary | ICD-10-CM | POA: Diagnosis not present

## 2024-05-20 DIAGNOSIS — I83893 Varicose veins of bilateral lower extremities with other complications: Secondary | ICD-10-CM | POA: Diagnosis not present

## 2024-05-20 DIAGNOSIS — Z95820 Peripheral vascular angioplasty status with implants and grafts: Secondary | ICD-10-CM | POA: Diagnosis not present

## 2024-05-20 DIAGNOSIS — L97822 Non-pressure chronic ulcer of other part of left lower leg with fat layer exposed: Secondary | ICD-10-CM | POA: Diagnosis not present

## 2024-05-23 DIAGNOSIS — Z859 Personal history of malignant neoplasm, unspecified: Secondary | ICD-10-CM | POA: Diagnosis not present

## 2024-05-23 DIAGNOSIS — I10 Essential (primary) hypertension: Secondary | ICD-10-CM | POA: Diagnosis not present

## 2024-05-23 DIAGNOSIS — S80822A Blister (nonthermal), left lower leg, initial encounter: Secondary | ICD-10-CM | POA: Diagnosis not present

## 2024-05-23 DIAGNOSIS — Z87891 Personal history of nicotine dependence: Secondary | ICD-10-CM | POA: Diagnosis not present

## 2024-05-26 ENCOUNTER — Ambulatory Visit: Payer: Self-pay

## 2024-05-26 NOTE — Telephone Encounter (Signed)
 FYI Only or Action Required?: Action required by provider: clinical question for provider.  Patient was last seen in primary care on 04/01/2024 by Avelina Greig BRAVO, MD.  Called Nurse Triage reporting Insect Bite.  Symptoms began 94 weeks.  Interventions attempted: Other: went to urgent care.  Symptoms are: unchanged.  Triage Disposition: No disposition on file.  Patient/caregiver understands and will follow disposition?:      Copied from CRM #[id number]. Topic: Clinical - Red Word Triage >> May 26, 2024  1:57 PM Robinson H wrote: Red Word that prompted transfer to Nurse Triage: Patient went to urgent care for insect bites and cellulitis and has a follow up appointment with provider on 7/29, states she was told by urgent care to ask pcp if its okay for her to double up on her furosemide  (LASIX ) 40 MG tablet to reduce the swelling until she comes in to see provider. Patient states that she has done this in the past and provider aware  Reason for Disposition  [1] MILD swelling of both ankles (i.e., pedal edema) AND [2] new-onset or getting worse  Answer Assessment - Initial Assessment Questions 1. ONSET: When did the swelling start? (e.g., minutes, hours, days)     94 weeks- pt was in wound center for total of 94 weeks 2. LOCATION: What part of the leg is swollen?  Are both legs swollen or just one leg?     Left leg knee and below 3. SEVERITY: How bad is the swelling? (e.g., localized; mild, moderate, severe)     Moderate  4. REDNESS: Is there redness or signs of infection?     pink not red 5. PAIN: Is the swelling painful to touch? If Yes, ask: How painful is it?   (Scale 1-10; mild, moderate or severe)     mild 6. FEVER: Do you have a fever? If Yes, ask: What is it, how was it measured, and when did it start?      no 7. CAUSE: What do you think is causing the leg swelling?     Unknown - history of venous ulcer on left leg, d/c from wound clinic recently 8.  MEDICAL HISTORY: Do you have a history of blood clots (e.g., DVT), cancer, heart failure, kidney disease, or liver failure?     na 9. RECURRENT SYMPTOM: Have you had leg swelling before? If Yes, ask: When was the last time? What happened that time?     no 10. OTHER SYMPTOMS: Do you have any other symptoms? (e.g., chest pain, difficulty breathing)       no 11. PREGNANCY: Is there any chance you are pregnant? When was your last menstrual period?       Na  Due to swelling of left leg - pt wanted to ask about doubling her lasix  & potassium temporarily to help reduce swelling.  Patient stated ok to leave a detailed message on answering service if pt is not available to answer.  Answer Assessment - Initial Assessment Questions 1. TYPE of INSECT: What type of insect was it?      Ant bite 2. ONSET: When did you get bitten?      *No Answer* 3. LOCATION: Where is the insect bite located?      Left leg 4. REDNESS: Is the area red or pink? If Yes, ask: What size is the area of redness? (inches or cm). When did the redness start?     *No Answer* 5. PAIN: Is there any pain? If Yes, ask:  How bad is the pain? (Scale 0-10; or none, mild, moderate, severe)     *No Answer* 6. ITCHING: Does it itch? If Yes, ask: How bad is the itch?      *No Answer* 7. SWELLING: How big is the swelling? (e.g., inches, cm, or compare to coins)     *No Answer* 8. OTHER SYMPTOMS: Do you have any other symptoms?  (e.g., difficulty breathing, fever, hives)     *No Answer* 9. PREGNANCY: Is there any chance you are pregnant? When was your last menstrual period?     *No Answer*  Bite by ants, went to UR received tx  Protocols used: Insect Bite-A-AH, Leg Swelling and Edema-A-AH

## 2024-05-26 NOTE — Telephone Encounter (Signed)
 Ok to increase lasix  to twice daily for 3-5 days then return to once daily dosing.

## 2024-05-27 ENCOUNTER — Ambulatory Visit (HOSPITAL_BASED_OUTPATIENT_CLINIC_OR_DEPARTMENT_OTHER): Admitting: General Surgery

## 2024-05-27 ENCOUNTER — Ambulatory Visit: Admitting: Podiatry

## 2024-05-27 NOTE — Telephone Encounter (Signed)
 Left message to return call to our office.

## 2024-05-29 DIAGNOSIS — H2511 Age-related nuclear cataract, right eye: Secondary | ICD-10-CM | POA: Diagnosis not present

## 2024-05-29 DIAGNOSIS — H52203 Unspecified astigmatism, bilateral: Secondary | ICD-10-CM | POA: Diagnosis not present

## 2024-05-29 DIAGNOSIS — H524 Presbyopia: Secondary | ICD-10-CM | POA: Diagnosis not present

## 2024-05-29 DIAGNOSIS — D3132 Benign neoplasm of left choroid: Secondary | ICD-10-CM | POA: Diagnosis not present

## 2024-05-29 NOTE — Telephone Encounter (Signed)
 Called patient she was given message by call center. No further questions at this time.

## 2024-06-02 ENCOUNTER — Encounter: Payer: Self-pay | Admitting: Family Medicine

## 2024-06-02 ENCOUNTER — Ambulatory Visit (INDEPENDENT_AMBULATORY_CARE_PROVIDER_SITE_OTHER): Payer: Medicare Other | Admitting: Family Medicine

## 2024-06-02 VITALS — BP 122/64 | HR 77 | Temp 97.5°F | Ht 60.0 in | Wt 207.2 lb

## 2024-06-02 DIAGNOSIS — M7989 Other specified soft tissue disorders: Secondary | ICD-10-CM

## 2024-06-02 DIAGNOSIS — G4733 Obstructive sleep apnea (adult) (pediatric): Secondary | ICD-10-CM | POA: Diagnosis not present

## 2024-06-02 DIAGNOSIS — I83893 Varicose veins of bilateral lower extremities with other complications: Secondary | ICD-10-CM | POA: Diagnosis not present
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
## 2024-06-02 NOTE — Patient Instructions (Addendum)
 Call to schedule bone density scan at [facility]   [address]   [place], [place] [address]  [phone]  We will ask for all xray reports from [facility] from 04/2024.  Leg is looking good! Continue regular lotion and compression.  Return in 2-3 months for follow up visit

## 2024-06-03 ENCOUNTER — Ambulatory Visit (HOSPITAL_BASED_OUTPATIENT_CLINIC_OR_DEPARTMENT_OTHER): Admitting: General Surgery

## 2024-06-03 ENCOUNTER — Encounter: Payer: Self-pay | Admitting: Family Medicine

## 2024-06-03 NOTE — Assessment & Plan Note (Signed)
 Will follow up with pulm for Rx for new machine.

## 2024-06-03 NOTE — Assessment & Plan Note (Signed)
 Completed wound clinic care 05/20/2024 for recurrent venous ulcers - appreciate their care. Latest fully healed. Legs look the best they have in a long time.  Some residual scabbing to posterior left leg from recent blisters Continue supportive measures including leg elevation, she will restart compression stocking use. Update if any worsening.

## 2024-06-08 ENCOUNTER — Other Ambulatory Visit

## 2024-06-08 DIAGNOSIS — R197 Diarrhea, unspecified: Secondary | ICD-10-CM | POA: Diagnosis not present

## 2024-06-08 DIAGNOSIS — K529 Noninfective gastroenteritis and colitis, unspecified: Secondary | ICD-10-CM

## 2024-06-09 ENCOUNTER — Telehealth: Payer: Self-pay

## 2024-06-09 NOTE — Telephone Encounter (Signed)
 Patient called back she will go by and see if she is able to get notes

## 2024-06-09 NOTE — Telephone Encounter (Signed)
 Called patient have been trying to get records from [facility] in [place]. When I call it request that I give them a number to txt when I can call but that is not possible from our office. I have called patient and left message to call office to see if she is able to call and get records sent to us .

## 2024-06-10 LAB — CALPROTECTIN, FECAL: Calprotectin, Fecal: 230 ug/g — ABNORMAL HIGH (ref 0–120)

## 2024-06-11 ENCOUNTER — Ambulatory Visit: Payer: Self-pay | Admitting: Physician Assistant

## 2024-06-11 NOTE — Progress Notes (Signed)
 Please let the patient know that her fecal calprotectin which monitors inflammation in the bowel is elevated.  I think we should proceed with colonoscopy with Dr. Legrand in [facility] for further evaluation of ongoing diarrhea.  Thanks, JL L

## 2024-06-15 ENCOUNTER — Telehealth: Payer: Self-pay | Admitting: Family Medicine

## 2024-06-15 DIAGNOSIS — R9389 Abnormal findings on diagnostic imaging of other specified body structures: Secondary | ICD-10-CM

## 2024-06-15 DIAGNOSIS — R21 Rash and other nonspecific skin eruption: Secondary | ICD-10-CM | POA: Diagnosis not present

## 2024-06-15 DIAGNOSIS — W57XXXA Bitten or stung by nonvenomous insect and other nonvenomous arthropods, initial encounter: Secondary | ICD-10-CM | POA: Diagnosis not present

## 2024-06-15 DIAGNOSIS — R0989 Other specified symptoms and signs involving the circulatory and respiratory systems: Secondary | ICD-10-CM | POA: Diagnosis not present

## 2024-06-15 DIAGNOSIS — L03116 Cellulitis of left lower limb: Secondary | ICD-10-CM | POA: Diagnosis not present

## 2024-06-15 DIAGNOSIS — I10 Essential (primary) hypertension: Secondary | ICD-10-CM | POA: Diagnosis not present

## 2024-06-15 DIAGNOSIS — I872 Venous insufficiency (chronic) (peripheral): Secondary | ICD-10-CM | POA: Diagnosis not present

## 2024-06-15 LAB — PANCREATIC ELASTASE, FECAL: Pancreatic Elastase-1, Stool: 568 ug/g (ref >200)

## 2024-06-15 NOTE — Telephone Encounter (Signed)
 Copied from CRM [id number]. Topic: General - Other >> Jun 15, 2024 10:50 AM Jayma L wrote: Reason for CRM: patient called to see if we got her fax from her urgent care visit, please callback patient and let her know if anything else is needed

## 2024-06-15 NOTE — Telephone Encounter (Signed)
 Records from pt's uc visit has been uploaded to media & routed to Dr. KANDICE for review.

## 2024-06-17 NOTE — Telephone Encounter (Addendum)
 Records reviewed - received imaging reports but not office visit records - R wrist xray negative for fracture, L knee xray negative for fracture but did show moderate osteoarthritis changes.  Chest xray showed haziness in left lung possible pneumonia - did she have fever, productive cough, and are symptoms better after zpack course prescribed by [facility]? We should repeat CXR around now - I have ordered. She may come at her convenience to get this done.

## 2024-06-17 NOTE — Telephone Encounter (Signed)
 Lvm asking pt to call back. Plz relay Dr Talmadge message, get answer to his question and schedule lab visit (CXR in note for visit).

## 2024-06-17 NOTE — Addendum Note (Signed)
 Addended by: RILLA BALLER on: 06/17/2024 11:35 AM   Modules accepted: Orders

## 2024-06-18 ENCOUNTER — Telehealth: Payer: Self-pay | Admitting: Physician Assistant

## 2024-06-18 NOTE — Telephone Encounter (Signed)
 Powell looks like you have been attempting to reach the pt

## 2024-06-18 NOTE — Telephone Encounter (Signed)
 I spoke with [name] and went over her stool study results. She wishes to proceed with the colonoscopy with Dr Legrand to evaluate the diarrhea. She has set up appointments for a in person pre-visit 07/15/2024 at 11:00AM and the colonoscopy for 07/21/2024 at 2:00pm. I will have paperwork mailed to her for the pre-visit appointment.

## 2024-06-18 NOTE — Telephone Encounter (Signed)
 Inbound call from patient stating she would like to speak to someone in regards to her stool sample kit results.  Requesting a call back   Please advise  Thank you

## 2024-06-18 NOTE — Telephone Encounter (Unsigned)
 Copied from CRM #[id number]. Topic: Clinical - Lab/Test Results >> Jun 18, 2024  2:11 PM Rea C wrote: Reason for CRM: Patient returned call and stated that she did not have a fever or productive cough, and her symptoms improved by the third day of antibiotics.  Scheduled patient for CXR lab visit.   Did not relay imaging notes due to E2c2 not being allowed to relay imaging results. Patient would need another contact to relay that information.

## 2024-06-19 ENCOUNTER — Encounter: Payer: Self-pay | Admitting: Gastroenterology

## 2024-06-23 DIAGNOSIS — H9209 Otalgia, unspecified ear: Secondary | ICD-10-CM | POA: Diagnosis not present

## 2024-06-23 DIAGNOSIS — H6121 Impacted cerumen, right ear: Secondary | ICD-10-CM | POA: Diagnosis not present

## 2024-06-23 DIAGNOSIS — I10 Essential (primary) hypertension: Secondary | ICD-10-CM | POA: Diagnosis not present

## 2024-06-24 ENCOUNTER — Other Ambulatory Visit

## 2024-06-24 ENCOUNTER — Ambulatory Visit (INDEPENDENT_AMBULATORY_CARE_PROVIDER_SITE_OTHER)
Admission: RE | Admit: 2024-06-24 | Discharge: 2024-06-24 | Disposition: A | Discharge status: Home / Self Care | Source: Ambulatory Visit | Attending: Family Medicine | Admitting: Family Medicine

## 2024-06-24 ENCOUNTER — Telehealth: Payer: Self-pay

## 2024-06-24 DIAGNOSIS — R9389 Abnormal findings on diagnostic imaging of other specified body structures: Secondary | ICD-10-CM | POA: Diagnosis not present

## 2024-06-24 DIAGNOSIS — J181 Lobar pneumonia, unspecified organism: Secondary | ICD-10-CM | POA: Diagnosis not present

## 2024-06-24 NOTE — Telephone Encounter (Signed)
 Fyi to Dr. Reece Agar

## 2024-06-24 NOTE — Telephone Encounter (Signed)
 Good afternoon,   I am sending just as a FYI. This patient had an OV 01/27/24 and is currently scheduled for a colonoscopy here at [facility] with Dr. Legrand. It is documented that her last coloscopy had to be done at [facility] due to hx of difficult intubation. Her PV is for 9/10. Can someone please ger her r/s.   Thank you, PV

## 2024-06-24 NOTE — Telephone Encounter (Signed)
Called the patient. No answer. Left a message of my call.

## 2024-06-25 NOTE — Telephone Encounter (Signed)
 Called patient and left another message of my call. Offering Pre-visit tomorrow at 1:00 pm  Colonoscopy at [facility] on 07/02/24 arriving at 8:45 am. Asked she call back today to confirm or decline.

## 2024-06-25 NOTE — Telephone Encounter (Signed)
 Called patient again. No answer. Left another message. Will cancel the [facility] date of 07/02/24.

## 2024-06-26 ENCOUNTER — Encounter

## 2024-06-26 NOTE — Telephone Encounter (Signed)
 Called the patient. No answer. Left details on the voicemail asking her to try the Imodium on a scheduled or regular basis to gain more control.  Advised she is presently on the [facility] schedule for 09/03/24 start time 8:15 am. Will move it if possible. Call patient on Monday 06/29/24 to follow up.

## 2024-06-26 NOTE — Telephone Encounter (Signed)
 E2C2 calling to connect call back from pt, vs relaying Dr Talmadge message.   Pt connected, I relayed Dr Talmadge message. Pt verbalizes understanding and expresses her thanks for info.
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
## 2024-06-26 NOTE — Telephone Encounter (Signed)
 Lvm asking pt to call back. Plz relay Dr Talmadge message concerning UC notes.

## 2024-06-26 NOTE — Telephone Encounter (Signed)
 Plz notify [facility] imaging showed R wrist xray negative for fracture, L knee xray negative for fracture but did show moderate osteoarthritis changes.   We are still awaiting radiology read of recent CXR done this week.

## 2024-06-26 NOTE — Telephone Encounter (Signed)
 Patient is aware. She will try Imodium and hope for a sooner colonoscopy date at [facility].

## 2024-06-29 ENCOUNTER — Ambulatory Visit: Payer: Self-pay | Admitting: Family Medicine

## 2024-06-29 DIAGNOSIS — I517 Cardiomegaly: Secondary | ICD-10-CM | POA: Insufficient documentation

## 2024-07-01 NOTE — Telephone Encounter (Signed)
 Noted (see other messages by clicking blue '[address] [place]' link).

## 2024-07-02 ENCOUNTER — Ambulatory Visit (HOSPITAL_COMMUNITY): Admission: RE | Admit: 2024-07-02 | Source: Home / Self Care | Admitting: Gastroenterology

## 2024-07-02 ENCOUNTER — Encounter (HOSPITAL_COMMUNITY): Admission: RE | Payer: Self-pay | Source: Home / Self Care

## 2024-07-02 SURGERY — COLONOSCOPY
Anesthesia: Monitor Anesthesia Care

## 2024-07-06 DIAGNOSIS — L03116 Cellulitis of left lower limb: Secondary | ICD-10-CM | POA: Diagnosis not present

## 2024-07-06 DIAGNOSIS — R21 Rash and other nonspecific skin eruption: Secondary | ICD-10-CM | POA: Diagnosis not present

## 2024-07-06 DIAGNOSIS — R0989 Other specified symptoms and signs involving the circulatory and respiratory systems: Secondary | ICD-10-CM | POA: Diagnosis not present

## 2024-07-09 ENCOUNTER — Ambulatory Visit (HOSPITAL_BASED_OUTPATIENT_CLINIC_OR_DEPARTMENT_OTHER): Admitting: Pulmonary Disease

## 2024-07-15 ENCOUNTER — Encounter

## 2024-07-17 DIAGNOSIS — R21 Rash and other nonspecific skin eruption: Secondary | ICD-10-CM | POA: Diagnosis not present

## 2024-07-17 DIAGNOSIS — R197 Diarrhea, unspecified: Secondary | ICD-10-CM | POA: Diagnosis not present

## 2024-07-20 DIAGNOSIS — I8312 Varicose veins of left lower extremity with inflammation: Secondary | ICD-10-CM | POA: Diagnosis not present

## 2024-07-20 DIAGNOSIS — Z85828 Personal history of other malignant neoplasm of skin: Secondary | ICD-10-CM | POA: Diagnosis not present

## 2024-07-20 DIAGNOSIS — L03116 Cellulitis of left lower limb: Secondary | ICD-10-CM | POA: Diagnosis not present

## 2024-07-20 DIAGNOSIS — I872 Venous insufficiency (chronic) (peripheral): Secondary | ICD-10-CM | POA: Diagnosis not present

## 2024-07-20 DIAGNOSIS — I8311 Varicose veins of right lower extremity with inflammation: Secondary | ICD-10-CM | POA: Diagnosis not present

## 2024-07-20 DIAGNOSIS — L0889 Other specified local infections of the skin and subcutaneous tissue: Secondary | ICD-10-CM | POA: Diagnosis not present

## 2024-07-20 DIAGNOSIS — Z85820 Personal history of malignant melanoma of skin: Secondary | ICD-10-CM | POA: Diagnosis not present

## 2024-07-21 ENCOUNTER — Encounter: Admitting: Gastroenterology

## 2024-07-27 ENCOUNTER — Encounter (HOSPITAL_BASED_OUTPATIENT_CLINIC_OR_DEPARTMENT_OTHER): Payer: Self-pay | Admitting: Pulmonary Disease

## 2024-07-27 ENCOUNTER — Ambulatory Visit (HOSPITAL_BASED_OUTPATIENT_CLINIC_OR_DEPARTMENT_OTHER): Admitting: Pulmonary Disease

## 2024-07-27 VITALS — BP 179/92 | HR 66 | Ht 60.0 in | Wt 206.0 lb

## 2024-07-27 DIAGNOSIS — G4733 Obstructive sleep apnea (adult) (pediatric): Secondary | ICD-10-CM | POA: Diagnosis not present

## 2024-07-27 DIAGNOSIS — Z85828 Personal history of other malignant neoplasm of skin: Secondary | ICD-10-CM | POA: Diagnosis not present

## 2024-07-27 DIAGNOSIS — Z85820 Personal history of malignant melanoma of skin: Secondary | ICD-10-CM | POA: Diagnosis not present

## 2024-07-27 DIAGNOSIS — I872 Venous insufficiency (chronic) (peripheral): Secondary | ICD-10-CM | POA: Diagnosis not present

## 2024-07-27 DIAGNOSIS — I8311 Varicose veins of right lower extremity with inflammation: Secondary | ICD-10-CM | POA: Diagnosis not present

## 2024-07-27 DIAGNOSIS — I8312 Varicose veins of left lower extremity with inflammation: Secondary | ICD-10-CM | POA: Diagnosis not present

## 2024-07-27 NOTE — Progress Notes (Signed)
 Subjective:    Patient ID: [name], female    DOB: 11/14/1945, 78 y.o.   MRN: [id number]   78 yo woman for followup of severe OSA & asthma  She has insomnia and delayed phase circadian rhythm disorder  She has a dozen cats at home and a litter of kittens   Moved from [place] - she was diagnosed with asthma in her 36s and took Advair for a long period of time.   PMH -  treated for melanoma from 2011-2013 requiring interferon therapy.  - Cervical disc disease and underwent fusion  She took neurotin for RLS for years   developed lymphedema after cervical fusion surgery. She has a Horner syndrome S/p venous stent for May Thurner syndrome  Discussed the use of AI scribe software for clinical note transcription with the patient, who gave verbal consent to proceed.  History of Present Illness [name] is a 78 year old female with asthma and obstructive sleep apnea who presents for follow-up.  In June, she experienced an upper respiratory infection with wheezing, initially treated with a low dose of prednisone , providing temporary relief. The wheezing returned, necessitating a regular dose pack of prednisone  and a Z-Pak antibiotic, which resolved her symptoms. She currently uses albuterol  as needed and takes Lasix  once daily.  Regarding obstructive sleep apnea, she is not using her CPAP machine. A new machine was returned to Medicare due to insufficient usage, and she is not using her old machine either, lacking replacement parts. She mentions needing a new sleep study and prescription for a CPAP machine.    Significant tests/ events reviewed PSG 2003 which showed AHI of 81 events per hour and lowest desaturation to 57%. This was corrected by CPAP of 12 cm in with residual events of 5.6 events per hour on a study in 2007. CPAP 11/05/13 to 03/24/14 >> average 4 hrs and 36 min. Average AHI is 6.5 with median CPAP 12 cm H2O.   PFTs 04/2014- no airway obs, ratio ok, FEV1 80%, TLC & DLCO  nml   Review of Systems  neg for any significant sore throat, dysphagia, itching, sneezing, nasal congestion or excess/ purulent secretions, fever, chills, sweats, unintended wt loss, pleuritic or exertional cp, hempoptysis, orthopnea pnd or change in chronic leg swelling. Also denies presyncope, palpitations, heartburn, abdominal pain, nausea, vomiting, diarrhea or change in bowel or urinary habits, dysuria,hematuria, rash, arthralgias, visual complaints, headache, numbness weakness or ataxia.      Objective:   Physical Exam  Gen. Pleasant, obese, in no distress ENT - no lesions, no post nasal drip Neck: No JVD, no thyromegaly, no carotid bruits Lungs: no use of accessory muscles, no dullness to percussion, decreased without rales or rhonchi  Cardiovascular: Rhythm regular, heart sounds  normal, no murmurs or gallops, 2+ peripheral edema Musculoskeletal: No deformities, no cyanosis or clubbing , no tremors       Assessment & Plan:   Assessment and Plan Assessment & Plan Asthma Asthma management has been challenging. Recent upper respiratory infection in June required prednisone  and azithromycin . Initial low-dose prednisone  was insufficient, necessitating a regular dose pack and antibiotic to resolve symptoms. - Ensure availability of albuterol  inhaler for rescue use.  Obstructive sleep apnea, not currently using CPAP Obstructive sleep apnea is unmanaged due to issues with the CPAP machine and Medicare requirements. She is not using any CPAP machine. A new sleep study and prescription are needed to resume CPAP therapy. - Order home sleep test. -  Provide new prescription for CPAP based on sleep study results.  Horner's syndrome Horner's syndrome in the left eye, possibly related to prolonged surgery. Symptoms include visual disturbances, especially in changing light conditions, affecting driving. No loss of consciousness reported. - Advise against driving until ophthalmological  evaluation is completed. - Follow up with [facility] for further assessment.  Stasis dermatitis of lower extremities Stasis dermatitis with venous ulcers. Recent treatment with penicillin by dermatologist, with negative lab results for infection. Symptoms include blisters and scabs, but no active infection. Dermatologist suspects stasis dermatitis rather than cellulitis. - Continue current course of penicillin. - Follow up with dermatologist for further management.  Hypertension Blood pressure is elevated today. Recheck & follow with PCP  General Health Maintenance Flu shot administered today without interference with current penicillin treatment. - Administer flu shot.

## 2024-07-27 NOTE — Patient Instructions (Addendum)
 X flu shot  X Home sleep test   VISIT SUMMARY: Today, we discussed several of your ongoing health issues, including asthma, obstructive sleep apnea, Horner's syndrome, stasis dermatitis, and hypertension. We also administered your flu shot.  YOUR PLAN: -ASTHMA: Asthma is a condition where your airways narrow and swell, making it difficult to breathe. Your recent upper respiratory infection required prednisone  and an antibiotic to manage your symptoms. Please ensure you have your albuterol  inhaler available for rescue use.  -OBSTRUCTIVE SLEEP APNEA: Obstructive sleep apnea is a condition where your breathing repeatedly stops and starts during sleep. You are currently not using a CPAP machine due to issues with Medicare and lack of replacement parts. We will order a home sleep test and provide a new prescription for a CPAP machine based on the results.  -HORNER'S SYNDROME: Horner's syndrome is a condition that affects the nerves to the eye and face, causing visual disturbances. You should avoid driving until you have been evaluated by an ophthalmologist. Please follow up with [facility] for further assessment.  -STASIS DERMATITIS: Stasis dermatitis is a skin condition caused by poor blood flow, leading to swelling, blisters, and scabs. You should continue your current course of penicillin and follow up with your dermatologist for further management.  -HYPERTENSION: Hypertension is high blood pressure. Your blood pressure was elevated today, and we will continue to monitor it closely.  -GENERAL HEALTH MAINTENANCE: You received your flu shot today, which will help protect you from the flu this season. This does not interfere with your current penicillin treatment.  INSTRUCTIONS: Please ensure you have your albuterol  inhaler available for rescue use. We will order a home sleep test and provide a new prescription for a CPAP machine based on the results. Avoid driving until you have been  evaluated by an ophthalmologist. Follow up with [facility] for further assessment. Continue your current course of penicillin and follow up with your dermatologist for further management. We will continue to monitor your blood pressure closely.                      Contains text generated by Abridge.                                 Contains text generated by Abridge.

## 2024-08-06 ENCOUNTER — Ambulatory Visit: Admitting: Podiatry

## 2024-08-13 ENCOUNTER — Inpatient Hospital Stay (HOSPITAL_BASED_OUTPATIENT_CLINIC_OR_DEPARTMENT_OTHER): Payer: Medicare Other | Admitting: Medical Oncology

## 2024-08-13 ENCOUNTER — Inpatient Hospital Stay: Payer: Medicare Other | Attending: Hematology & Oncology

## 2024-08-13 VITALS — BP 153/54 | HR 73 | Temp 98.0°F | Resp 20 | Ht 60.0 in | Wt 203.1 lb

## 2024-08-13 DIAGNOSIS — Z923 Personal history of irradiation: Secondary | ICD-10-CM | POA: Diagnosis not present

## 2024-08-13 DIAGNOSIS — L97909 Non-pressure chronic ulcer of unspecified part of unspecified lower leg with unspecified severity: Secondary | ICD-10-CM | POA: Insufficient documentation

## 2024-08-13 DIAGNOSIS — C439 Malignant melanoma of skin, unspecified: Secondary | ICD-10-CM | POA: Diagnosis not present

## 2024-08-13 DIAGNOSIS — Z85820 Personal history of malignant melanoma of skin: Secondary | ICD-10-CM

## 2024-08-13 DIAGNOSIS — C4361 Malignant melanoma of right upper limb, including shoulder: Secondary | ICD-10-CM | POA: Diagnosis not present

## 2024-08-13 LAB — CBC WITH DIFFERENTIAL (CANCER CENTER ONLY)
Abs Immature Granulocytes: 0.01 K/uL (ref 0.00–0.07)
Basophils Absolute: 0 K/uL (ref 0.0–0.1)
Basophils Relative: 0 %
Eosinophils Absolute: 0.1 K/uL (ref 0.0–0.5)
Eosinophils Relative: 2 %
HCT: 39.4 % (ref 36.0–46.0)
Hemoglobin: 12.5 g/dL (ref 12.0–15.0)
Immature Granulocytes: 0 %
Lymphocytes Relative: 17 %
Lymphs Abs: 1.2 K/uL (ref 0.7–4.0)
MCH: 29.7 pg (ref 26.0–34.0)
MCHC: 31.7 g/dL (ref 30.0–36.0)
MCV: 93.6 fL (ref 80.0–100.0)
Monocytes Absolute: 0.6 K/uL (ref 0.1–1.0)
Monocytes Relative: 9 %
Neutro Abs: 5.1 K/uL (ref 1.7–7.7)
Neutrophils Relative %: 72 %
Platelet Count: 225 K/uL (ref 150–400)
RBC: 4.21 MIL/uL (ref 3.87–5.11)
RDW: 12.6 % (ref 11.5–15.5)
WBC Count: 7 K/uL (ref 4.0–10.5)
nRBC: 0 % (ref 0.0–0.2)

## 2024-08-13 LAB — CMP (CANCER CENTER ONLY)
ALT: 16 U/L (ref 0–44)
AST: 17 U/L (ref 15–41)
Albumin: 4.3 g/dL (ref 3.5–5.0)
Alkaline Phosphatase: 61 U/L (ref 38–126)
Anion gap: 10 (ref 5–15)
BUN: 15 mg/dL (ref 8–23)
CO2: 29 mmol/L (ref 22–32)
Calcium: 9.7 mg/dL (ref 8.9–10.3)
Chloride: 104 mmol/L (ref 98–111)
Creatinine: 0.7 mg/dL (ref 0.44–1.00)
GFR, Estimated: >60 mL/min (ref >60)
Glucose, Bld: 99 mg/dL (ref 70–99)
Potassium: 4.1 mmol/L (ref 3.5–5.1)
Sodium: 142 mmol/L (ref 135–145)
Total Bilirubin: 0.5 mg/dL (ref 0.0–1.2)
Total Protein: 7.6 g/dL (ref 6.5–8.1)

## 2024-08-13 LAB — LACTATE DEHYDROGENASE: LDH: 250 U/L — ABNORMAL HIGH (ref 98–192)

## 2024-08-13 NOTE — Progress Notes (Signed)
 Hematology and Oncology Follow Up Visit  [name] [id number] [birth year] 78 y.o. 08/13/2024   Principle Diagnosis:  Stage IIIC (T3b N3 M0) melanoma of the right shoulder   Current Therapy:        Observation   Interim History:  Ms. Barrantes is here today for follow-up.  We see her yearly for follow up. Fortunately her melanoma does not seem to give her much trouble.   She has had no problems with nausea or vomiting.    She has had no problems with bowels or bladder.  She does have issues with leg ulcers.  She is followed by the wound clinic.  She has wrappings on her legs. Her previous ulcer took 90+ weeks to resolve. No new lesions or bumps.   There has been no bleeding to her knowledge: denies epistaxis, gingivitis, hemoptysis, hematemesis, hematuria, melena, excessive bruising, blood donation.   No unintentional weight loss, no night sweats, no new bone pains.  No new lumps or bumps No visual changes or changes to chronic headaches Overall, I would say that her performance status is probably ECOG 1.   Wt Readings from Last 3 Encounters:  08/13/24 203 lb 1.9 oz (92.1 kg)  07/27/24 206 lb (93.4 kg)  06/02/24 207 lb 4 oz (94 kg)    Medications:  Allergies as of 08/13/2024       Reactions   Sulfa Antibiotics Itching, Swelling, Shortness Of Breath   Facial swelling   Voltaren  [diclofenac  Sodium] Shortness Of Breath   Latex Other (See Comments)   Blisters ONLY HAD REACTION TO TAPE  (??? ONLY ADHESIVE ALLERGY)   Tape Other (See Comments)   Caused blisters - must use paper tape - same reaction from NeoPrene   Diclofenac  Sodium    Doxycycline Other (See Comments)   Diaphoresis and dizziness   Gabapentin  Other (See Comments)   confusion, sleep a lot, and swelling in her legs   Other    Neoprene   Wound Dressing Adhesive Rash        Medication List        Accurate as of August 13, 2024 11:51 AM. If you have any questions, ask your nurse or doctor.           acetaminophen  650 MG CR tablet Commonly known as: TYLENOL  Take 2 tablets (1,300 mg total) by mouth 2 (two) times daily as needed for pain.   albuterol  108 (90 Base) MCG/ACT inhaler Commonly known as: VENTOLIN  HFA INHALE 1-2 PUFFS BY MOUTH EVERY 6 HOURS AS NEEDED FOR WHEEZE OR SHORTNESS OF BREATH   aspirin  EC 81 MG tablet Take 81 mg by mouth at bedtime. Melanoma prevention   Chloraseptic Gargle 1.4 % Liqd Generic drug: phenol Use as directed 1 spray in the mouth or throat as needed for throat irritation / pain.   furosemide  40 MG tablet Commonly known as: LASIX  Take 1 tablet (40 mg total) by mouth daily. Take second dose if needed for leg swelling/weight gain   loratadine  10 MG tablet Commonly known as: CLARITIN  Take 10 mg by mouth daily.   losartan  50 MG tablet Commonly known as: COZAAR  Take 1 tablet (50 mg total) by mouth daily.   multivitamin with minerals Tabs tablet Take 1 tablet by mouth daily.   potassium chloride  SA 20 MEQ tablet Commonly known as: Klor-Con  M20 Take 1 tablet (20 mEq total) by mouth 2 (two) times daily.   PRESCRIPTION MEDICATION CPAP   saccharomyces boulardii 250 MG capsule Commonly known  as: Florastor Take 1 capsule (250 mg total) by mouth 2 (two) times daily.   TEARS PLUS OP Place 1 drop into both eyes daily as needed (dry eyes/ redness/ burning).   triamcinolone  55 MCG/ACT Aero nasal inhaler Commonly known as: NASACORT  Place 2 sprays into the nose daily. In each nostril   Vitamin D  (Ergocalciferol ) 1.25 MG (50000 UNIT) Caps capsule Commonly known as: DRISDOL  Take 1,250 mcg by mouth every 7 (seven) days.   Vitamin D  (Ergocalciferol ) 1.25 MG (50000 UNIT) Caps capsule Commonly known as: DRISDOL  TAKE 1 CAPSULE BY MOUTH ONCE A WEEK        Allergies:  Allergies  Allergen Reactions   Sulfa Antibiotics Itching, Swelling and Shortness Of Breath    Facial swelling   Voltaren  [Diclofenac  Sodium] Shortness Of Breath   Latex Other  (See Comments)    Blisters ONLY HAD REACTION TO TAPE  (??? ONLY ADHESIVE ALLERGY)   Tape Other (See Comments)    Caused blisters - must use paper tape - same reaction from NeoPrene   Diclofenac  Sodium    Doxycycline Other (See Comments)    Diaphoresis and dizziness   Gabapentin  Other (See Comments)    confusion, sleep a lot, and swelling in her legs   Other     Neoprene   Wound Dressing Adhesive Rash    Past Medical History, Surgical history, Social history, and Family History were reviewed and updated.  Review of Systems: Review of Systems  Constitutional: Negative.   HENT: Negative.    Eyes: Negative.   Respiratory: Negative.    Cardiovascular: Negative.   Gastrointestinal: Negative.   Genitourinary: Negative.   Musculoskeletal: Negative.   Skin:  Positive for rash.  Neurological: Negative.   Endo/Heme/Allergies: Negative.   Psychiatric/Behavioral: Negative.        Physical Exam:  height is 5' (1.524 m) and weight is 203 lb 1.9 oz (92.1 kg). Her oral temperature is 98 F (36.7 C). Her blood pressure is 153/54 (abnormal) and her pulse is 73. Her respiration is 20 and oxygen  saturation is 100%.   Wt Readings from Last 3 Encounters:  08/13/24 203 lb 1.9 oz (92.1 kg)  07/27/24 206 lb (93.4 kg)  06/02/24 207 lb 4 oz (94 kg)    Physical Exam Vitals reviewed.  HENT:     Head: Normocephalic and atraumatic.  Eyes:     Pupils: Pupils are equal, round, and reactive to light.  Cardiovascular:     Rate and Rhythm: Normal rate and regular rhythm.     Heart sounds: Normal heart sounds.  Pulmonary:     Effort: Pulmonary effort is normal.     Breath sounds: Normal breath sounds.  Abdominal:     General: Bowel sounds are normal.     Palpations: Abdomen is soft.  Musculoskeletal:        General: No tenderness or deformity. Normal range of motion.     Cervical back: Normal range of motion.  Lymphadenopathy:     Cervical: No cervical adenopathy.  Skin:    General: Skin  is warm and dry.     Findings: No erythema or rash.  Neurological:     Mental Status: She is alert and oriented to person, place, and time.  Psychiatric:        Behavior: Behavior normal.        Thought Content: Thought content normal.        Judgment: Judgment normal.      Lab Results  Component Value  Date   WBC 7.0 08/13/2024   HGB 12.5 08/13/2024   HCT 39.4 08/13/2024   MCV 93.6 08/13/2024   PLT 225 08/13/2024   Lab Results  Component Value Date   FERRITIN 52.7 12/04/2023   IRON 61 12/04/2023   TIBC 317.8 12/04/2023   UIBC 207 08/21/2013   IRONPCTSAT 19.2 (L) 12/04/2023   Lab Results  Component Value Date   RBC 4.21 08/13/2024   No results found for: KPAFRELGTCHN, LAMBDASER, KAPLAMBRATIO No results found for: KIMBERLY LE, IGMSERUM No results found for: STEPHANY CARLOTA BENSON MARKEL EARLA JOANNIE DOC VICK, SPEI   Chemistry      Component Value Date/Time   NA 144 03/06/2024 1555   NA 149 (H) 08/15/2017 1039   NA 143 09/17/2016 1018   K 3.8 03/06/2024 1555   K 4.1 08/15/2017 1039   K 3.5 09/17/2016 1018   CL 107 03/06/2024 1555   CL 107 08/15/2017 1039   CO2 28 03/06/2024 1555   CO2 29 08/15/2017 1039   CO2 28 09/17/2016 1018   BUN 24 03/06/2024 1555   BUN 14 08/15/2017 1039   BUN 19.5 09/17/2016 1018   CREATININE 0.81 03/06/2024 1555   CREATININE 0.8 09/17/2016 1018      Component Value Date/Time   CALCIUM 9.5 03/06/2024 1555   CALCIUM 9.7 08/15/2017 1039   CALCIUM 9.8 09/17/2016 1018   ALKPHOS 56 12/04/2023 1324   ALKPHOS 48 08/15/2017 1039   ALKPHOS 71 09/17/2016 1018   AST 12 03/06/2024 1555   AST 12 (L) 08/16/2023 1128   AST 10 09/17/2016 1018   ALT 14 03/06/2024 1555   ALT 19 08/16/2023 1128   ALT 43 08/15/2017 1039   ALT 18 09/17/2016 1018   BILITOT 0.4 03/06/2024 1555   BILITOT 0.6 08/16/2023 1128   BILITOT 0.39 09/17/2016 1018      Encounter Diagnosis  Name Primary?   Malignant  melanoma, unspecified site [facility]) Yes    Impression and Plan: Ms. Siddique is a very pleasant 78 yo caucasian female with history of stage IIIc melanoma if the right shoulder with resection. She had 8 positive lymph nodes and completed adjuvant interferon therapy in December 2012  followed by radiation to the right axilla. Since then she has been doing well and is on observation.    No obvious evidence of recurrence. For this reason we elect to hold off on imaging. It has now been 13 years since she had her adjuvant therapy. CBC stable CMP and LDH pending Last TSH was 8 months ago and normal at 2.21  RTC 1 year MD, labs (CBC w/, CMP, LDH)  Lauraine CHRISTELLA Dais, PA-C 10/9/202511:51 AM

## 2024-08-17 DIAGNOSIS — I10 Essential (primary) hypertension: Secondary | ICD-10-CM | POA: Diagnosis not present

## 2024-08-17 DIAGNOSIS — I87332 Chronic venous hypertension (idiopathic) with ulcer and inflammation of left lower extremity: Secondary | ICD-10-CM | POA: Diagnosis not present

## 2024-08-17 DIAGNOSIS — J45909 Unspecified asthma, uncomplicated: Secondary | ICD-10-CM | POA: Diagnosis not present

## 2024-08-21 DIAGNOSIS — I10 Essential (primary) hypertension: Secondary | ICD-10-CM | POA: Diagnosis not present

## 2024-08-21 DIAGNOSIS — J45909 Unspecified asthma, uncomplicated: Secondary | ICD-10-CM | POA: Diagnosis not present

## 2024-08-21 DIAGNOSIS — S81802D Unspecified open wound, left lower leg, subsequent encounter: Secondary | ICD-10-CM | POA: Diagnosis not present

## 2024-08-26 ENCOUNTER — Telehealth: Payer: Self-pay | Admitting: Gastroenterology

## 2024-08-26 NOTE — Telephone Encounter (Signed)
 Procedure:Colonoscopy Procedure date: 09/03/24 Procedure location: [facility] Arrival Time: 6:45 am Spoke with the patient Y/N: Yes Any prep concerns? No  Has the patient obtained the prep from the pharmacy ? ___ Do you have a care partner and transportation:  ___ Any additional concerns? Bruna states she had been talking to [facility] and she doesn't know if she will be able to have the procedure that she has too much going on right now and the appointment is too early In the morning. I told her that I would have Beth to call her.

## 2024-08-26 NOTE — Telephone Encounter (Signed)
 Thank you for the update.  Please see if my fourth patient scheduled for December 8 [facility] wants to move up to October 30.  Thank you  HD

## 2024-08-26 NOTE — Telephone Encounter (Addendum)
 Spoke with the patient. She does not have a care partner for the appointment. Right now I am the only one in my group of friends that can drive.  Patient also has ulcers on her LE that are open and weeping at this time. She wants to reschedule her colonoscopy. Her diarrhea is under control with the Lomotil.  Patient will be rescheduled at the next available hospital opening.

## 2024-09-03 ENCOUNTER — Encounter (HOSPITAL_COMMUNITY): Payer: Self-pay

## 2024-09-03 ENCOUNTER — Ambulatory Visit (HOSPITAL_COMMUNITY): Admit: 2024-09-03 | Admitting: Gastroenterology

## 2024-09-03 DIAGNOSIS — B372 Candidiasis of skin and nail: Secondary | ICD-10-CM | POA: Diagnosis not present

## 2024-09-03 DIAGNOSIS — I872 Venous insufficiency (chronic) (peripheral): Secondary | ICD-10-CM | POA: Diagnosis not present

## 2024-09-03 SURGERY — COLONOSCOPY
Anesthesia: Monitor Anesthesia Care

## 2024-09-07 ENCOUNTER — Ambulatory Visit: Admitting: Family Medicine

## 2024-09-07 ENCOUNTER — Encounter: Payer: Self-pay | Admitting: Family Medicine

## 2024-09-07 VITALS — BP 144/72 | HR 78 | Temp 98.0°F | Ht 60.0 in | Wt 204.2 lb

## 2024-09-07 DIAGNOSIS — I83892 Varicose veins of left lower extremities with other complications: Secondary | ICD-10-CM | POA: Diagnosis not present

## 2024-09-07 DIAGNOSIS — M79604 Pain in right leg: Secondary | ICD-10-CM | POA: Diagnosis not present

## 2024-09-07 DIAGNOSIS — Z85820 Personal history of malignant melanoma of skin: Secondary | ICD-10-CM | POA: Diagnosis not present

## 2024-09-07 DIAGNOSIS — I872 Venous insufficiency (chronic) (peripheral): Secondary | ICD-10-CM | POA: Diagnosis not present

## 2024-09-07 DIAGNOSIS — I10 Essential (primary) hypertension: Secondary | ICD-10-CM | POA: Diagnosis not present

## 2024-09-07 DIAGNOSIS — L97929 Non-pressure chronic ulcer of unspecified part of left lower leg with unspecified severity: Secondary | ICD-10-CM

## 2024-09-07 DIAGNOSIS — R600 Localized edema: Secondary | ICD-10-CM | POA: Diagnosis not present

## 2024-09-07 DIAGNOSIS — Z85828 Personal history of other malignant neoplasm of skin: Secondary | ICD-10-CM | POA: Diagnosis not present

## 2024-09-07 DIAGNOSIS — I8311 Varicose veins of right lower extremity with inflammation: Secondary | ICD-10-CM | POA: Diagnosis not present

## 2024-09-07 DIAGNOSIS — M79605 Pain in left leg: Secondary | ICD-10-CM

## 2024-09-07 DIAGNOSIS — I83029 Varicose veins of left lower extremity with ulcer of unspecified site: Secondary | ICD-10-CM

## 2024-09-07 DIAGNOSIS — I8312 Varicose veins of left lower extremity with inflammation: Secondary | ICD-10-CM | POA: Diagnosis not present

## 2024-09-07 NOTE — Patient Instructions (Addendum)
 New referral placed to [facility] wound clinic. Call [place] wound clinic to request new appointment to wound clinic [phone]. If unable to get seen at wound clinic this week, return on Friday for wound check.   Increase lasix  to 40mg  twice daily for the rest of the week, double up on potassium dose as well.  Continue to monitor blood pressures, bring in BP log to next visit.   Return in 3 months for medicare wellness visit

## 2024-09-07 NOTE — Progress Notes (Unsigned)
 Ph: [phone] Fax: [phone]   Patient ID: [name], female    DOB: [birth year], 78 y.o.   MRN: [id number]  This visit was conducted in person.  BP (!) 144/72 (BP Location: Left Arm, Cuff Size: Large)   Pulse 78   Temp 98 F (36.7 C) (Oral)   Ht 5' (1.524 m)   Wt 204 lb 4 oz (92.6 kg)   SpO2 97%   BMI 39.89 kg/m   BP Readings from Last 3 Encounters:  09/07/24 (!) 144/72  08/13/24 (!) 153/54  07/27/24 (!) 179/92  She attributes elevated BP due to current leg pain  CC: 3 mo f/u visit  Subjective:   HPI: [name] is a 78 y.o. female presenting on 09/07/2024 for Medical Management of Chronic Issues (Pt here for 3 mo f/u)   Planned colonoscopy last week had to be rescheduled due to no transportation.   Last discharged from wound clinic 05/20/2024. Over the past 3 weeks LLE has again developed open sores, swelling, weeping through skin.   Seen at [facility] last week - has been prescribed a few courses of antibiotics. Latest wound culture contaminated - currently on clindamycin 450mg  TID, followed by diflucan 100mg  daily x7d.   She has also been seeing Dr Rolan Molt dermatology, last seen this morning s/p LLE compression wrap placed this morning. Recommended referral back to wound clinic - referral placed. Was recommended 20-45mmHg compression stockings however she's unable to place these on herself. She may start using lymphedema pump.   HTN - Compliant with current antihypertensive regimen of lasix  40mg  every day with 2nd dose PRN (she take at night and prefers to take nightly), losartan  50mg  daily. Does check blood pressures at home and notes marked fluctuations - 120-190/50-100, averaging 150/75.  No low blood pressure readings or symptoms of dizziness/syncope.  Denies HA, vision changes, CP/tightness, SOB, leg swelling.    Lab Results  Component Value Date   NA 142 08/13/2024   CL 104 08/13/2024   K 4.1 08/13/2024   CO2 29 08/13/2024   BUN 15 08/13/2024    CREATININE 0.70 08/13/2024   GFRNONAA >60 08/13/2024   CALCIUM 9.7 08/13/2024   PHOS 3.3 01/28/2022   ALBUMIN 4.3 08/13/2024   GLUCOSE 99 08/13/2024   Lab Results  Component Value Date   WBC 7.0 08/13/2024   HGB 12.5 08/13/2024   HCT 39.4 08/13/2024   MCV 93.6 08/13/2024   PLT 225 08/13/2024       Relevant past medical, surgical, family and social history reviewed and updated as indicated. Interim medical history since our last visit reviewed. Allergies and medications reviewed and updated. Outpatient Medications Prior to Visit  Medication Sig Dispense Refill   acetaminophen  (TYLENOL ) 650 MG CR tablet Take 2 tablets (1,300 mg total) by mouth 2 (two) times daily as needed for pain.     albuterol  (VENTOLIN  HFA) 108 (90 Base) MCG/ACT inhaler INHALE 1-2 PUFFS BY MOUTH EVERY 6 HOURS AS NEEDED FOR WHEEZE OR SHORTNESS OF BREATH 8.5 each 2   aspirin  EC 81 MG tablet Take 81 mg by mouth at bedtime. Melanoma prevention     furosemide  (LASIX ) 40 MG tablet Take 1 tablet (40 mg total) by mouth daily. Take second dose if needed for leg swelling/weight gain 100 tablet 4   loratadine  (CLARITIN ) 10 MG tablet Take 10 mg by mouth daily.     losartan  (COZAAR ) 50 MG tablet Take 1 tablet (50 mg total) by mouth daily. 90 tablet  4   Multiple Vitamin (MULTIVITAMIN WITH MINERALS) TABS tablet Take 1 tablet by mouth daily.     phenol (CHLORASEPTIC GARGLE) 1.4 % LIQD Use as directed 1 spray in the mouth or throat as needed for throat irritation / pain. 118 mL 0   Polyvinyl Alcohol-Povidone (TEARS PLUS OP) Place 1 drop into both eyes daily as needed (dry eyes/ redness/ burning).      potassium chloride  SA (KLOR-CON  M20) 20 MEQ tablet Take 1 tablet (20 mEq total) by mouth 2 (two) times daily. 180 tablet 4   PRESCRIPTION MEDICATION CPAP     saccharomyces boulardii (FLORASTOR) 250 MG capsule Take 1 capsule (250 mg total) by mouth 2 (two) times daily.     triamcinolone  (NASACORT ) 55 MCG/ACT AERO nasal inhaler Place 2  sprays into the nose daily. In each nostril     Vitamin D , Ergocalciferol , (DRISDOL ) 1.25 MG (50000 UNIT) CAPS capsule TAKE 1 CAPSULE BY MOUTH ONCE A WEEK (Patient not taking: Reported on 09/07/2024) 12 capsule 4   Vitamin D , Ergocalciferol , (DRISDOL ) 1.25 MG (50000 UNIT) CAPS capsule Take 1,250 mcg by mouth every 7 (seven) days.     No facility-administered medications prior to visit.     Per HPI unless specifically indicated in ROS section below Review of Systems  Objective:  BP (!) 144/72 (BP Location: Left Arm, Cuff Size: Large)   Pulse 78   Temp 98 F (36.7 C) (Oral)   Ht 5' (1.524 m)   Wt 204 lb 4 oz (92.6 kg)   SpO2 97%   BMI 39.89 kg/m   Wt Readings from Last 3 Encounters:  09/07/24 204 lb 4 oz (92.6 kg)  08/13/24 203 lb 1.9 oz (92.1 kg)  07/27/24 206 lb (93.4 kg)      Physical Exam Vitals and nursing note reviewed.  Constitutional:      Appearance: Normal appearance.  Neck:     Comments: Chronic cervical kyphosis deformity  Musculoskeletal:        General: Swelling and tenderness present.     Right lower leg: Edema present.     Left lower leg: Edema present.     Comments: LLE with compression wrap in place without obvious erythema  Neurological:     Mental Status: She is alert.       Results for orders placed or performed in visit on 08/13/24  Lactate dehydrogenase   Collection Time: 08/13/24 11:26 AM  Result Value Ref Range   LDH 250 (H) 98 - 192 U/L  CMP (Cancer Center only)   Collection Time: 08/13/24 11:26 AM  Result Value Ref Range   Sodium 142 135 - 145 mmol/L   Potassium 4.1 3.5 - 5.1 mmol/L   Chloride 104 98 - 111 mmol/L   CO2 29 22 - 32 mmol/L   Glucose, Bld 99 70 - 99 mg/dL   BUN 15 8 - 23 mg/dL   Creatinine 9.29 9.55 - 1.00 mg/dL   Calcium 9.7 8.9 - 89.6 mg/dL   Total Protein 7.6 6.5 - 8.1 g/dL   Albumin 4.3 3.5 - 5.0 g/dL   AST 17 15 - 41 U/L   ALT 16 0 - 44 U/L   Alkaline Phosphatase 61 38 - 126 U/L   Total Bilirubin 0.5 0.0 - 1.2  mg/dL   GFR, Estimated >39 >39 mL/min   Anion gap 10 5 - 15  CBC with Differential (Cancer Center Only)   Collection Time: 08/13/24 11:26 AM  Result Value Ref Range  WBC Count 7.0 4.0 - 10.5 K/uL   RBC 4.21 3.87 - 5.11 MIL/uL   Hemoglobin 12.5 12.0 - 15.0 g/dL   HCT 60.5 63.9 - 53.9 %   MCV 93.6 80.0 - 100.0 fL   MCH 29.7 26.0 - 34.0 pg   MCHC 31.7 30.0 - 36.0 g/dL   RDW 87.3 88.4 - 84.4 %   Platelet Count 225 150 - 400 K/uL   nRBC 0.0 0.0 - 0.2 %   Neutrophils Relative % 72 %   Neutro Abs 5.1 1.7 - 7.7 K/uL   Lymphocytes Relative 17 %   Lymphs Abs 1.2 0.7 - 4.0 K/uL   Monocytes Relative 9 %   Monocytes Absolute 0.6 0.1 - 1.0 K/uL   Eosinophils Relative 2 %   Eosinophils Absolute 0.1 0.0 - 0.5 K/uL   Basophils Relative 0 %   Basophils Absolute 0.0 0.0 - 0.1 K/uL   Immature Granulocytes 0 %   Abs Immature Granulocytes 0.01 0.00 - 0.07 K/uL   *Note: Due to a large number of results and/or encounters for the requested time period, some results have not been displayed. A complete set of results can be found in Results Review.   Lab Results  Component Value Date   VD25OH 54.19 12/04/2023   Assessment & Plan:   Problem List Items Addressed This Visit     Hypertension   Notes significant BP fluctuations at home despite losartan  50mg  daily, lasix  40mg  daily.  She is hesitant for any med changes.  Agrees to increase lasix  to BID x4d then reassess at [facility] appt.       Bilateral lower extremity edema   Increase lasix  to 40mg  bid x4 days then reassess at f/u visit Friday, increase potassium accordingly      Relevant Orders   Ambulatory referral to Wound Clinic   Leg pain, bilateral   Relevant Orders   Ambulatory referral to Wound Clinic   Venous stasis ulcer of left lower leg with edema of left lower leg (HCC) - Primary   Chronic issue, last saw wound clinic 05/2024 - with healed wound at that time.  Now with recurrent issue initially managed by dermatology, will refer back  to wound clinic urgently.  RTC Friday in case unable to be seen this week for compression wrap replacement.  She has received several antibiotic courses recently including keflex  and clindaymcin, as well as diflucan 100mg  7d course by urgent care - this was stopped by dermatology.       Relevant Orders   Ambulatory referral to Wound Clinic     No orders of the defined types were placed in this encounter.   Orders Placed This Encounter  Procedures   Ambulatory referral to Wound Clinic    Referral Priority:   Urgent    Referral Type:   Consultation    Referral Reason:   Specialty Services Required    Requested Specialty:   Wound Care    Number of Visits Requested:   1    Patient Instructions  New referral placed to [facility] wound clinic. Call [place] wound clinic to request new appointment to wound clinic [phone]. If unable to get seen at wound clinic this week, return on Friday for wound check.   Increase lasix  to 40mg  twice daily for the rest of the week, double up on potassium dose as well.  Continue to monitor blood pressures, bring in BP log to next visit.   Return in 3 months for medicare wellness  visit   Follow up plan: Return in about 4 days (around 09/11/2024) for follow up visit.  Anton Blas, MD

## 2024-09-08 ENCOUNTER — Telehealth: Payer: Self-pay

## 2024-09-08 NOTE — Telephone Encounter (Signed)
 Please schedule at 4pm on Friday as I requested at discharge instructions yesterday.

## 2024-09-08 NOTE — Assessment & Plan Note (Signed)
 Notes significant BP fluctuations at home despite losartan  50mg  daily, lasix  40mg  daily.  She is hesitant for any med changes.  Agrees to increase lasix  to BID x4d then reassess at [facility] appt.

## 2024-09-08 NOTE — Telephone Encounter (Signed)
 Copied from CRM #[id number]. Topic: General - Other >> Sep 08, 2024  9:46 AM Burnard DEL wrote: Reason for CRM: Patient was advised to come back in on Friday 09/11/2024 at 4 for a wound recheck /rewrap. Patient was seen in office on yesterday with provider,upon checking out she was told that provider did not have any appointments available on 09/11/2024.Patient would like to know if provider is going to squeeze her in on his schedule for Friday ,or what should she do? Please advise.

## 2024-09-08 NOTE — Assessment & Plan Note (Signed)
 Increase lasix  to 40mg  bid x4 days then reassess at f/u visit Friday, increase potassium accordingly

## 2024-09-08 NOTE — Assessment & Plan Note (Addendum)
 Chronic issue, last saw wound clinic 05/2024 - with healed wound at that time.  Now with recurrent issue initially managed by dermatology, will refer back to wound clinic urgently.  RTC Friday in case unable to be seen this week for compression wrap replacement.  She has received several antibiotic courses recently including keflex  and clindaymcin, as well as diflucan 100mg  7d course by urgent care - this was stopped by dermatology.

## 2024-09-09 DIAGNOSIS — G902 Horner's syndrome: Secondary | ICD-10-CM | POA: Diagnosis not present

## 2024-09-09 DIAGNOSIS — Z6835 Body mass index (BMI) 35.0-35.9, adult: Secondary | ICD-10-CM | POA: Diagnosis not present

## 2024-09-09 NOTE — Telephone Encounter (Signed)
 Pt notified. Appt has been made.

## 2024-09-10 ENCOUNTER — Ambulatory Visit (INDEPENDENT_AMBULATORY_CARE_PROVIDER_SITE_OTHER): Admitting: Podiatry

## 2024-09-10 DIAGNOSIS — Z91198 Patient's noncompliance with other medical treatment and regimen for other reason: Secondary | ICD-10-CM

## 2024-09-10 DIAGNOSIS — I87319 Chronic venous hypertension (idiopathic) with ulcer of unspecified lower extremity: Secondary | ICD-10-CM | POA: Diagnosis not present

## 2024-09-10 DIAGNOSIS — R609 Edema, unspecified: Secondary | ICD-10-CM | POA: Diagnosis not present

## 2024-09-11 ENCOUNTER — Encounter: Payer: Self-pay | Admitting: Family Medicine

## 2024-09-11 ENCOUNTER — Ambulatory Visit (INDEPENDENT_AMBULATORY_CARE_PROVIDER_SITE_OTHER): Admitting: Family Medicine

## 2024-09-11 VITALS — BP 158/82 | HR 71 | Temp 97.7°F | Ht 60.0 in | Wt 207.2 lb

## 2024-09-11 DIAGNOSIS — L97929 Non-pressure chronic ulcer of unspecified part of left lower leg with unspecified severity: Secondary | ICD-10-CM | POA: Diagnosis not present

## 2024-09-11 DIAGNOSIS — R600 Localized edema: Secondary | ICD-10-CM | POA: Diagnosis not present

## 2024-09-11 DIAGNOSIS — I83029 Varicose veins of left lower extremity with ulcer of unspecified site: Secondary | ICD-10-CM

## 2024-09-11 NOTE — Progress Notes (Signed)
 Ph: [phone] Fax: [phone]   Patient ID: [name], female    DOB: 1946-05-09, 78 y.o.   MRN: [id number]  This visit was conducted in person.  BP (!) 158/82   Pulse 71   Temp 97.7 F (36.5 C) (Oral)   Ht 5' (1.524 m)   Wt 207 lb 4 oz (94 kg)   SpO2 95%   BMI 40.48 kg/m    CC: wound check  Subjective:   HPI: [name] is a 78 y.o. female presenting on 09/11/2024 for Medical Management of Chronic Issues (Pt is here for a wound f/u. Pt states it is not getting any better.)   See prior note for details.  Seen at [facility] yesterday after she woke up with leg soaking through bandages and bed. Compression dressing was removed.  She has received several antibiotic courses recently including keflex  and clindaymcin, as well as diflucan 100mg  7d course by urgent care     Relevant past medical, surgical, family and social history reviewed and updated as indicated. Interim medical history since our last visit reviewed. Allergies and medications reviewed and updated. Outpatient Medications Prior to Visit  Medication Sig Dispense Refill   acetaminophen  (TYLENOL ) 650 MG CR tablet Take 2 tablets (1,300 mg total) by mouth 2 (two) times daily as needed for pain.     albuterol  (VENTOLIN  HFA) 108 (90 Base) MCG/ACT inhaler INHALE 1-2 PUFFS BY MOUTH EVERY 6 HOURS AS NEEDED FOR WHEEZE OR SHORTNESS OF BREATH 8.5 each 2   aspirin  EC 81 MG tablet Take 81 mg by mouth at bedtime. Melanoma prevention     furosemide  (LASIX ) 40 MG tablet Take 1 tablet (40 mg total) by mouth daily. Take second dose if needed for leg swelling/weight gain 100 tablet 4   loratadine  (CLARITIN ) 10 MG tablet Take 10 mg by mouth daily.     losartan  (COZAAR ) 50 MG tablet Take 1 tablet (50 mg total) by mouth daily. 90 tablet 4   Multiple Vitamin (MULTIVITAMIN WITH MINERALS) TABS tablet Take 1 tablet by mouth daily.     phenol (CHLORASEPTIC GARGLE) 1.4 % LIQD Use as directed 1 spray in the mouth or throat as  needed for throat irritation / pain. 118 mL 0   Polyvinyl Alcohol-Povidone (TEARS PLUS OP) Place 1 drop into both eyes daily as needed (dry eyes/ redness/ burning).      potassium chloride  SA (KLOR-CON  M20) 20 MEQ tablet Take 1 tablet (20 mEq total) by mouth 2 (two) times daily. 180 tablet 4   PRESCRIPTION MEDICATION CPAP     saccharomyces boulardii (FLORASTOR) 250 MG capsule Take 1 capsule (250 mg total) by mouth 2 (two) times daily.     triamcinolone  (NASACORT ) 55 MCG/ACT AERO nasal inhaler Place 2 sprays into the nose daily. In each nostril     Vitamin D , Ergocalciferol , (DRISDOL ) 1.25 MG (50000 UNIT) CAPS capsule TAKE 1 CAPSULE BY MOUTH ONCE A WEEK 12 capsule 4   No facility-administered medications prior to visit.     Per HPI unless specifically indicated in ROS section below Review of Systems  Objective:  BP (!) 158/82   Pulse 71   Temp 97.7 F (36.5 C) (Oral)   Ht 5' (1.524 m)   Wt 207 lb 4 oz (94 kg)   SpO2 95%   BMI 40.48 kg/m   Wt Readings from Last 3 Encounters:  09/11/24 207 lb 4 oz (94 kg)  09/07/24 204 lb 4 oz (92.6 kg)  08/13/24 203 lb 1.9 oz (92.1 kg)      Physical Exam Vitals and nursing note reviewed.  Constitutional:      Appearance: Normal appearance. She is not ill-appearing.  Musculoskeletal:        General: Swelling and tenderness present.     Right lower leg: Edema present.     Left lower leg: Edema present.     Comments:  Diminished pedal pulses  Skin:    General: Skin is warm and dry.     Findings: Rash and wound present. No erythema.     Comments:  See pics Left lower extremity with denuded weeping skin throughout, some accumulation of skin debris, shallow ulceration predominantly lateral lower leg, without surrounding erythema.   Neurological:     Mental Status: She is alert.        LLE wound care: Wound cleaned with sterile water, debridement with wet gauze, unna boot applied as per below, pt tolerated well.   Left unna boot  placement: Informed consent obtained. Using standard procedure, calomine impregnated gauze used to dress and wrap legs followed by fluffy kerlex wrap and coban wrap. Patient tolerated well. Red flags to seek urgent care reviewed.   Current wound volume (surface dimensions and depth): diffuse denuded skin anterior lower leg, ~2cm shallow ulcer to left lateral lower leg - see pics Presence (and extent of) or absence of obvious signs of infection: absence of erythema /pus Presence (and extent of) or absence of necrotic, devitalized or non-viable tissue: skin debris accumulation debrided with wet gauze  Other material in the wound that is expected to inhibit healing or promote adjacent tissue breakdown: none     Results for orders placed or performed in visit on 08/13/24  Lactate dehydrogenase   Collection Time: 08/13/24 11:26 AM  Result Value Ref Range   LDH 250 (H) 98 - 192 U/L  CMP (Cancer Center only)   Collection Time: 08/13/24 11:26 AM  Result Value Ref Range   Sodium 142 135 - 145 mmol/L   Potassium 4.1 3.5 - 5.1 mmol/L   Chloride 104 98 - 111 mmol/L   CO2 29 22 - 32 mmol/L   Glucose, Bld 99 70 - 99 mg/dL   BUN 15 8 - 23 mg/dL   Creatinine 9.29 9.55 - 1.00 mg/dL   Calcium 9.7 8.9 - 89.6 mg/dL   Total Protein 7.6 6.5 - 8.1 g/dL   Albumin 4.3 3.5 - 5.0 g/dL   AST 17 15 - 41 U/L   ALT 16 0 - 44 U/L   Alkaline Phosphatase 61 38 - 126 U/L   Total Bilirubin 0.5 0.0 - 1.2 mg/dL   GFR, Estimated >39 >39 mL/min   Anion gap 10 5 - 15  CBC with Differential (Cancer Center Only)   Collection Time: 08/13/24 11:26 AM  Result Value Ref Range   WBC Count 7.0 4.0 - 10.5 K/uL   RBC 4.21 3.87 - 5.11 MIL/uL   Hemoglobin 12.5 12.0 - 15.0 g/dL   HCT 60.5 63.9 - 53.9 %   MCV 93.6 80.0 - 100.0 fL   MCH 29.7 26.0 - 34.0 pg   MCHC 31.7 30.0 - 36.0 g/dL   RDW 87.3 88.4 - 84.4 %   Platelet Count 225 150 - 400 K/uL   nRBC 0.0 0.0 - 0.2 %   Neutrophils Relative % 72 %   Neutro Abs 5.1 1.7 - 7.7  K/uL   Lymphocytes Relative 17 %   Lymphs Abs 1.2 0.7 -  4.0 K/uL   Monocytes Relative 9 %   Monocytes Absolute 0.6 0.1 - 1.0 K/uL   Eosinophils Relative 2 %   Eosinophils Absolute 0.1 0.0 - 0.5 K/uL   Basophils Relative 0 %   Basophils Absolute 0.0 0.0 - 0.1 K/uL   Immature Granulocytes 0 %   Abs Immature Granulocytes 0.01 0.00 - 0.07 K/uL   *Note: Due to a large number of results and/or encounters for the requested time period, some results have not been displayed. A complete set of results can be found in Results Review.    Assessment & Plan:   Problem List Items Addressed This Visit     Venous stasis ulcer of left lower leg with edema of left lower leg (HCC) - Primary   Chronic issue. No obvious infection. Unna boot placed as per above, pt tolerated well. Reviewed red flags to seek further care.  She has wound clinic appt for 09/21/2024. She thinks she may be able to have unna boot replaced in 5-7 days at local [facility] in [place].         No orders of the defined types were placed in this encounter.   No orders of the defined types were placed in this encounter.   There are no Patient Instructions on file for this visit.  Follow up plan: No follow-ups on file.  Anton Blas, MD

## 2024-09-12 NOTE — Assessment & Plan Note (Signed)
 Chronic issue. No obvious infection. Unna boot placed as per above, pt tolerated well. Reviewed red flags to seek further care.  She has wound clinic appt for 09/21/2024. She thinks she may be able to have unna boot replaced in 5-7 days at local [facility] in [place].

## 2024-09-14 DIAGNOSIS — J45909 Unspecified asthma, uncomplicated: Secondary | ICD-10-CM | POA: Diagnosis not present

## 2024-09-14 DIAGNOSIS — M79605 Pain in left leg: Secondary | ICD-10-CM | POA: Diagnosis not present

## 2024-09-14 DIAGNOSIS — R21 Rash and other nonspecific skin eruption: Secondary | ICD-10-CM | POA: Diagnosis not present

## 2024-09-15 ENCOUNTER — Ambulatory Visit: Payer: Self-pay

## 2024-09-15 NOTE — Telephone Encounter (Signed)
 FYI Only or Action Required?: Action required by provider: clinical question for provider and update on patient condition.  Patient was last seen in primary care on 09/11/2024 by Rilla Baller, MD.  Called Nurse Triage reporting Leg Swelling.  Symptoms began several months ago.  Interventions attempted: Rest, hydration, or home remedies.  Symptoms are: unchanged.  Triage Disposition: See HCP Within 4 Hours (Or PCP Triage)  Patient/caregiver understands and will follow disposition?: No, wishes to speak with PCP  Copied from CRM #[id number]. Topic: Clinical - Red Word Triage >> Sep 15, 2024 11:34 AM Rea BROCKS wrote: Red Word that prompted transfer to Nurse Triage: Patient has swollen left leg with wounds. Patient went to urgent care yesterday but they couldnt wrap the unna boot for her. Patient is wrapped in gauze and bandage from urgent care but it is not going to hold until Monday. Patient's leg is very swollen, very painful, covered in sores. Reason for Disposition  SEVERE leg swelling (e.g., swelling extends above knee, entire leg is swollen, weeping fluid)  Answer Assessment - Initial Assessment Questions Patient reports continued swelling and pain to left leg. Patient reports that she is needing a unna boot placed on her left leg. Patient was seen at urgent care yesterday and they cleaned and wrapped her leg with gauze and coband. Urgent care doesn't carry unna boots and patient is needing assistance with getting another unna boot placed. Patient is asking if she would be able to be seen in the office today to have another unna boot placed to get her to Monday. Patient is scheduled to be seen at the wound care center on Monday. Asking for a call back from the office.   1. ONSET: When did the swelling start? (e.g., minutes, hours, days)     Six months 2. LOCATION: What part of the leg is swollen?  Are both legs swollen or just one leg?     Left leg 3. SEVERITY: How bad is the  swelling? (e.g., localized; mild, moderate, severe)     severe 4. REDNESS: Is there redness or signs of infection?     Sores to the leg 5. PAIN: Is the swelling painful to touch? If Yes, ask: How painful is it?   (Scale 1-10; mild, moderate or severe)     10 6. FEVER: Do you have a fever? If Yes, ask: What is it, how was it measured, and when did it start?      no 7. CAUSE: What do you think is causing the leg swelling?     Venous insufficiency  8. MEDICAL HISTORY: Do you have a history of blood clots (e.g., DVT), cancer, heart failure, kidney disease, or liver failure?     no 9. RECURRENT SYMPTOM: Have you had leg swelling before? If Yes, ask: When was the last time? What happened that time?     yes 10. OTHER SYMPTOMS: Do you have any other symptoms? (e.g., chest pain, difficulty breathing)       Sores to the leg.  Protocols used: Leg Swelling and Edema-A-AH

## 2024-09-15 NOTE — Telephone Encounter (Unsigned)
 Copied from CRM [id number]. Topic: Clinical - Medication Prior Auth >> Sep 14, 2024  4:38 PM Alfonso ORN wrote: Reason for CRM: pt called to f/u. in urgent care currently and they want to put uniboot on pt but not in stock at urgent care for daily washing.   Callback: [phone]

## 2024-09-15 NOTE — Telephone Encounter (Addendum)
 Im just getting this message now.  Spoke with patient. [facility] doesn't have unna boot supplies Please schedule tomorrow wed at 4:30pm.

## 2024-09-16 ENCOUNTER — Ambulatory Visit (INDEPENDENT_AMBULATORY_CARE_PROVIDER_SITE_OTHER): Admitting: Family Medicine

## 2024-09-16 VITALS — BP 138/82 | HR 82 | Temp 98.6°F | Ht 60.0 in | Wt 207.0 lb

## 2024-09-16 DIAGNOSIS — I83029 Varicose veins of left lower extremity with ulcer of unspecified site: Secondary | ICD-10-CM | POA: Diagnosis not present

## 2024-09-16 DIAGNOSIS — R600 Localized edema: Secondary | ICD-10-CM | POA: Diagnosis not present

## 2024-09-16 DIAGNOSIS — L97929 Non-pressure chronic ulcer of unspecified part of left lower leg with unspecified severity: Secondary | ICD-10-CM | POA: Diagnosis not present

## 2024-09-16 NOTE — Progress Notes (Unsigned)
 Ph: [phone] Fax: [phone]   Patient ID: [name], female    DOB: [birth year], 78 y.o.   MRN: [id number]  This visit was conducted in person.  BP 138/82   Pulse 82   Temp 98.6 F (37 C) (Oral)   Ht 5' (1.524 m)   Wt 207 lb (93.9 kg)   SpO2 95%   BMI 40.43 kg/m    CC: Unna boot placement  Subjective:   HPI: [name] is a 78 y.o. female presenting on 09/16/2024 for No chief complaint on file.    See prior note for details. Here for rpt Unna boot placement. Previously one stayed on for 3 days. [facility] was unable to replace.  Pending wound clinic appt next Monday.   Denies fevers/chills, streaking redness, or nausea.   Left unna boot placement: Informed consent obtained. Skin cleaned/debrided with wet gauze pads. Using standard procedure, calomine impregnated gauze used to dress and wrap legs followed by fluffy kerlix wrap then ACE wrap. Patient tolerated well. Red flags to seek urgent care reviewed.   Presence (and extent of) or absence of obvious signs of infection: absent Presence (and extent of) or absence of necrotic, devitalized or non-viable tissue: absent Other material in the wound that is expected to inhibit healing or promote adjacent tissue breakdown: none      Relevant past medical, surgical, family and social history reviewed and updated as indicated. Interim medical history since our last visit reviewed. Allergies and medications reviewed and updated. Outpatient Medications Prior to Visit  Medication Sig Dispense Refill   acetaminophen  (TYLENOL ) 650 MG CR tablet Take 2 tablets (1,300 mg total) by mouth 2 (two) times daily as needed for pain.     albuterol  (VENTOLIN  HFA) 108 (90 Base) MCG/ACT inhaler INHALE 1-2 PUFFS BY MOUTH EVERY 6 HOURS AS NEEDED FOR WHEEZE OR SHORTNESS OF BREATH 8.5 each 2   aspirin  EC 81 MG tablet Take 81 mg by mouth at bedtime. Melanoma prevention     furosemide  (LASIX ) 40 MG tablet Take 1 tablet (40 mg total) by  mouth daily. Take second dose if needed for leg swelling/weight gain 100 tablet 4   loratadine  (CLARITIN ) 10 MG tablet Take 10 mg by mouth daily.     losartan  (COZAAR ) 50 MG tablet Take 1 tablet (50 mg total) by mouth daily. 90 tablet 4   Multiple Vitamin (MULTIVITAMIN WITH MINERALS) TABS tablet Take 1 tablet by mouth daily.     phenol (CHLORASEPTIC GARGLE) 1.4 % LIQD Use as directed 1 spray in the mouth or throat as needed for throat irritation / pain. 118 mL 0   Polyvinyl Alcohol-Povidone (TEARS PLUS OP) Place 1 drop into both eyes daily as needed (dry eyes/ redness/ burning).      potassium chloride  SA (KLOR-CON  M20) 20 MEQ tablet Take 1 tablet (20 mEq total) by mouth 2 (two) times daily. 180 tablet 4   PRESCRIPTION MEDICATION CPAP     saccharomyces boulardii (FLORASTOR) 250 MG capsule Take 1 capsule (250 mg total) by mouth 2 (two) times daily.     triamcinolone  (NASACORT ) 55 MCG/ACT AERO nasal inhaler Place 2 sprays into the nose daily. In each nostril     Vitamin D , Ergocalciferol , (DRISDOL ) 1.25 MG (50000 UNIT) CAPS capsule TAKE 1 CAPSULE BY MOUTH ONCE A WEEK 12 capsule 4   No facility-administered medications prior to visit.     Per HPI unless specifically indicated in ROS section below Review of Systems  Objective:  BP  138/82   Pulse 82   Temp 98.6 F (37 C) (Oral)   Ht 5' (1.524 m)   Wt 207 lb (93.9 kg)   SpO2 95%   BMI 40.43 kg/m   Wt Readings from Last 3 Encounters:  09/16/24 207 lb (93.9 kg)  09/11/24 207 lb 4 oz (94 kg)  09/07/24 204 lb 4 oz (92.6 kg)      Physical Exam Vitals and nursing note reviewed.  Constitutional:      Appearance: Normal appearance. She is not ill-appearing.  Musculoskeletal:        General: Swelling and tenderness present.     Right lower leg: Edema present.     Left lower leg: Edema present.     Comments:  Diminished pedal pulses  Skin:    General: Skin is warm and dry.     Findings: Rash and wound present. No erythema.          Comments:  See pics Left lower extremity with denuded weeping skin, less skin debris than prior, shallow ulceration to lateral lower leg, without surrounding erythema.   Neurological:     Mental Status: She is alert.              Assessment & Plan:   Problem List Items Addressed This Visit     Venous stasis ulcer of left lower leg with edema of left lower leg (HCC) - Primary   Chronic issue. No obvious infection. Unna boot placed as per above, pt tolerated well. Reviewed red flags to seek further care.  She has wound clinic appt for 09/21/2024.         No orders of the defined types were placed in this encounter.   No orders of the defined types were placed in this encounter.   There are no Patient Instructions on file for this visit.  Follow up plan: No follow-ups on file.  Anton Blas, MD

## 2024-09-16 NOTE — Telephone Encounter (Signed)
 Patient added.

## 2024-09-16 NOTE — Assessment & Plan Note (Signed)
 Chronic issue. No obvious infection. Unna boot placed as per above, pt tolerated well. Reviewed red flags to seek further care.  She has wound clinic appt for 09/21/2024.

## 2024-09-17 NOTE — Progress Notes (Signed)
 1. Failure to attend appointment with reason given    Appointment rescheduled by patient.

## 2024-09-18 ENCOUNTER — Encounter: Payer: Self-pay | Admitting: Family Medicine

## 2024-09-18 DIAGNOSIS — Z4801 Encounter for change or removal of surgical wound dressing: Secondary | ICD-10-CM | POA: Diagnosis not present

## 2024-09-18 DIAGNOSIS — J45909 Unspecified asthma, uncomplicated: Secondary | ICD-10-CM | POA: Diagnosis not present

## 2024-09-18 DIAGNOSIS — I10 Essential (primary) hypertension: Secondary | ICD-10-CM | POA: Diagnosis not present

## 2024-09-20 DIAGNOSIS — I890 Lymphedema, not elsewhere classified: Secondary | ICD-10-CM | POA: Diagnosis not present

## 2024-09-20 DIAGNOSIS — I10 Essential (primary) hypertension: Secondary | ICD-10-CM | POA: Diagnosis not present

## 2024-09-20 DIAGNOSIS — J45909 Unspecified asthma, uncomplicated: Secondary | ICD-10-CM | POA: Diagnosis not present

## 2024-09-20 DIAGNOSIS — Z4800 Encounter for change or removal of nonsurgical wound dressing: Secondary | ICD-10-CM | POA: Diagnosis not present

## 2024-09-21 ENCOUNTER — Encounter: Attending: Physician Assistant | Admitting: Physician Assistant

## 2024-09-21 DIAGNOSIS — I87332 Chronic venous hypertension (idiopathic) with ulcer and inflammation of left lower extremity: Secondary | ICD-10-CM | POA: Diagnosis not present

## 2024-09-21 DIAGNOSIS — L03116 Cellulitis of left lower limb: Secondary | ICD-10-CM | POA: Diagnosis not present

## 2024-09-21 DIAGNOSIS — L97822 Non-pressure chronic ulcer of other part of left lower leg with fat layer exposed: Secondary | ICD-10-CM | POA: Insufficient documentation

## 2024-09-21 DIAGNOSIS — I890 Lymphedema, not elsewhere classified: Secondary | ICD-10-CM | POA: Diagnosis not present

## 2024-09-24 ENCOUNTER — Encounter

## 2024-09-24 DIAGNOSIS — L97822 Non-pressure chronic ulcer of other part of left lower leg with fat layer exposed: Secondary | ICD-10-CM | POA: Diagnosis not present

## 2024-09-24 DIAGNOSIS — I890 Lymphedema, not elsewhere classified: Secondary | ICD-10-CM | POA: Diagnosis not present

## 2024-09-24 DIAGNOSIS — L03116 Cellulitis of left lower limb: Secondary | ICD-10-CM | POA: Diagnosis not present

## 2024-09-24 DIAGNOSIS — I87332 Chronic venous hypertension (idiopathic) with ulcer and inflammation of left lower extremity: Secondary | ICD-10-CM | POA: Diagnosis not present

## 2024-09-28 ENCOUNTER — Encounter: Admitting: Physician Assistant

## 2024-09-28 DIAGNOSIS — I890 Lymphedema, not elsewhere classified: Secondary | ICD-10-CM | POA: Diagnosis not present

## 2024-09-28 DIAGNOSIS — L97822 Non-pressure chronic ulcer of other part of left lower leg with fat layer exposed: Secondary | ICD-10-CM | POA: Diagnosis not present

## 2024-09-28 DIAGNOSIS — I87332 Chronic venous hypertension (idiopathic) with ulcer and inflammation of left lower extremity: Secondary | ICD-10-CM | POA: Diagnosis not present

## 2024-09-28 DIAGNOSIS — L03116 Cellulitis of left lower limb: Secondary | ICD-10-CM | POA: Diagnosis not present

## 2024-09-30 ENCOUNTER — Encounter

## 2024-10-05 ENCOUNTER — Encounter

## 2024-10-05 DIAGNOSIS — I87332 Chronic venous hypertension (idiopathic) with ulcer and inflammation of left lower extremity: Secondary | ICD-10-CM | POA: Diagnosis present

## 2024-10-05 DIAGNOSIS — L97822 Non-pressure chronic ulcer of other part of left lower leg with fat layer exposed: Secondary | ICD-10-CM | POA: Insufficient documentation

## 2024-10-05 DIAGNOSIS — L03116 Cellulitis of left lower limb: Secondary | ICD-10-CM | POA: Insufficient documentation

## 2024-10-07 ENCOUNTER — Encounter

## 2024-10-07 DIAGNOSIS — I87332 Chronic venous hypertension (idiopathic) with ulcer and inflammation of left lower extremity: Secondary | ICD-10-CM | POA: Diagnosis not present

## 2024-10-09 ENCOUNTER — Encounter: Admitting: Physician Assistant

## 2024-10-09 DIAGNOSIS — I87332 Chronic venous hypertension (idiopathic) with ulcer and inflammation of left lower extremity: Secondary | ICD-10-CM | POA: Diagnosis not present

## 2024-10-09 DIAGNOSIS — I890 Lymphedema, not elsewhere classified: Secondary | ICD-10-CM | POA: Diagnosis not present

## 2024-10-09 DIAGNOSIS — L97822 Non-pressure chronic ulcer of other part of left lower leg with fat layer exposed: Secondary | ICD-10-CM | POA: Diagnosis not present

## 2024-10-12 ENCOUNTER — Encounter

## 2024-10-12 DIAGNOSIS — I87332 Chronic venous hypertension (idiopathic) with ulcer and inflammation of left lower extremity: Secondary | ICD-10-CM | POA: Diagnosis not present

## 2024-10-14 ENCOUNTER — Encounter

## 2024-10-14 DIAGNOSIS — I87332 Chronic venous hypertension (idiopathic) with ulcer and inflammation of left lower extremity: Secondary | ICD-10-CM | POA: Diagnosis not present

## 2024-10-16 ENCOUNTER — Encounter

## 2024-10-16 DIAGNOSIS — I87332 Chronic venous hypertension (idiopathic) with ulcer and inflammation of left lower extremity: Secondary | ICD-10-CM | POA: Diagnosis not present

## 2024-10-19 ENCOUNTER — Encounter

## 2024-10-19 DIAGNOSIS — I87332 Chronic venous hypertension (idiopathic) with ulcer and inflammation of left lower extremity: Secondary | ICD-10-CM | POA: Diagnosis not present

## 2024-10-21 ENCOUNTER — Encounter: Admitting: Internal Medicine

## 2024-10-21 DIAGNOSIS — I87332 Chronic venous hypertension (idiopathic) with ulcer and inflammation of left lower extremity: Secondary | ICD-10-CM | POA: Diagnosis not present

## 2024-10-21 DIAGNOSIS — I890 Lymphedema, not elsewhere classified: Secondary | ICD-10-CM | POA: Diagnosis not present

## 2024-10-21 DIAGNOSIS — L97822 Non-pressure chronic ulcer of other part of left lower leg with fat layer exposed: Secondary | ICD-10-CM | POA: Diagnosis not present

## 2024-10-22 ENCOUNTER — Telehealth: Payer: Self-pay

## 2024-10-22 NOTE — Telephone Encounter (Signed)
 LMTCB about rescheduling her colon @ [facility] w/ Dr Legrand.

## 2024-10-23 ENCOUNTER — Encounter

## 2024-10-23 DIAGNOSIS — I87332 Chronic venous hypertension (idiopathic) with ulcer and inflammation of left lower extremity: Secondary | ICD-10-CM | POA: Diagnosis not present

## 2024-10-26 ENCOUNTER — Encounter

## 2024-10-26 ENCOUNTER — Telehealth: Payer: Self-pay

## 2024-10-26 NOTE — Telephone Encounter (Signed)
 [name]

## 2024-10-26 NOTE — Telephone Encounter (Signed)
 Per appt notes pt has appt with wound care nurse 10/26/24. Sending note to Dr KANDICE.

## 2024-10-26 NOTE — Telephone Encounter (Signed)
 Noted. Has wound clinic appt today at 3:45pm.

## 2024-10-28 ENCOUNTER — Encounter

## 2024-11-03 ENCOUNTER — Encounter: Admitting: Physician Assistant

## 2024-11-03 DIAGNOSIS — I87332 Chronic venous hypertension (idiopathic) with ulcer and inflammation of left lower extremity: Secondary | ICD-10-CM | POA: Diagnosis not present

## 2024-11-04 ENCOUNTER — Ambulatory Visit

## 2024-11-06 ENCOUNTER — Encounter: Attending: Physician Assistant

## 2024-11-06 DIAGNOSIS — I87332 Chronic venous hypertension (idiopathic) with ulcer and inflammation of left lower extremity: Secondary | ICD-10-CM | POA: Diagnosis present

## 2024-11-06 DIAGNOSIS — L97822 Non-pressure chronic ulcer of other part of left lower leg with fat layer exposed: Secondary | ICD-10-CM | POA: Diagnosis not present

## 2024-11-06 DIAGNOSIS — L03116 Cellulitis of left lower limb: Secondary | ICD-10-CM | POA: Diagnosis not present

## 2024-11-06 DIAGNOSIS — I890 Lymphedema, not elsewhere classified: Secondary | ICD-10-CM | POA: Diagnosis not present

## 2024-11-09 ENCOUNTER — Encounter

## 2024-11-09 ENCOUNTER — Ambulatory Visit (INDEPENDENT_AMBULATORY_CARE_PROVIDER_SITE_OTHER)

## 2024-11-09 VITALS — BP 138/82 | Ht 60.0 in | Wt 200.0 lb

## 2024-11-09 DIAGNOSIS — Z0000 Encounter for general adult medical examination without abnormal findings: Secondary | ICD-10-CM | POA: Diagnosis not present

## 2024-11-09 NOTE — Progress Notes (Signed)
 " I connected with  [name] on 11/09/2024 by a audio enabled telemedicine application and verified that I am speaking with the correct person using two identifiers.  Patient Location: Home  Provider Location: Home Office  Persons Participating in Visit: Patient.  I discussed the limitations of evaluation and management by telemedicine. The patient expressed understanding and agreed to proceed.  Vital Signs: Because this visit was a virtual/telehealth visit, some criteria may be missing or patient reported. Any vitals not documented were not able to be obtained and vitals that have been documented are patient reported.  Chief Complaint  Patient presents with   Medicare Wellness     Subjective:   [name] is a 79 y.o. female who presents for a Medicare Annual Wellness Visit.  Visit info / Clinical Intake: Medicare Wellness Visit Type:: Subsequent Annual Wellness Visit Persons participating in visit and providing information:: patient Medicare Wellness Visit Mode:: Telephone If telephone:: video declined Since this visit was completed virtually, some vitals may be partially provided or unavailable. Missing vitals are due to the limitations of the virtual format.: Documented vitals are patient reported If Telephone or Video please confirm:: I connected with patient using audio/video enable telemedicine. I verified patient identity with two identifiers, discussed telehealth limitations, and patient agreed to proceed. Patient Location:: home Provider Location:: home office Interpreter Needed?: No Pre-visit prep was completed: yes AWV questionnaire completed by patient prior to visit?: no Living arrangements:: (!) lives alone Patient's Overall Health Status Rating: very good Typical amount of pain: some Does pain affect daily life?: no Are you currently prescribed opioids?: no  Dietary Habits and Nutritional Risks How many meals a day?: 3 Eats fruit and vegetables daily?:  yes Most meals are obtained by: preparing own meals; eating out In the last 2 weeks, have you had any of the following?: none Diabetic:: no  Functional Status Activities of Daily Living (to include ambulation/medication): Independent Ambulation: Independent Medication Administration: Independent Home Management (perform basic housework or laundry): Independent Manage your own finances?: yes Primary transportation is: driving Concerns about vision?: no *vision screening is required for WTM* Concerns about hearing?: no  Fall Screening Falls in the past year?: 0 Number of falls in past year: 0 Was there an injury with Fall?: 0 Fall Risk Category Calculator: 0 Patient Fall Risk Level: Low Fall Risk  Fall Risk Patient at Risk for Falls Due to: No Fall Risks Fall risk Follow up: Falls evaluation completed; Falls prevention discussed  Home and Transportation Safety: All rugs have non-skid backing?: yes All stairs or steps have railings?: yes Grab bars in the bathtub or shower?: yes Have non-skid surface in bathtub or shower?: yes Good home lighting?: yes Regular seat belt use?: yes Hospital stays in the last year:: no  Cognitive Assessment Difficulty concentrating, remembering, or making decisions? : no Will 6CIT or Mini Cog be Completed: no 6CIT or Mini Cog Declined: patient declined  Advance Directives (For Healthcare) Does Patient Have a Medical Advance Directive?: No Would patient like information on creating a medical advance directive?: No - Patient declined  Reviewed/Updated  Reviewed/Updated: Reviewed All (Medical, Surgical, Family, Medications, Allergies, Care Teams, Patient Goals)    Allergies (verified) Sulfa antibiotics, Voltaren  [diclofenac  sodium], Latex, Tape, Diclofenac  sodium, Doxycycline, Gabapentin , Other, and Wound dressing adhesive   Current Medications (verified) Outpatient Encounter Medications as of 11/09/2024  Medication Sig   acetaminophen   (TYLENOL ) 650 MG CR tablet Take 2 tablets (1,300 mg total) by mouth 2 (two) times daily  as needed for pain.   albuterol  (VENTOLIN  HFA) 108 (90 Base) MCG/ACT inhaler INHALE 1-2 PUFFS BY MOUTH EVERY 6 HOURS AS NEEDED FOR WHEEZE OR SHORTNESS OF BREATH   aspirin  EC 81 MG tablet Take 81 mg by mouth at bedtime. Melanoma prevention   furosemide  (LASIX ) 40 MG tablet Take 1 tablet (40 mg total) by mouth daily. Take second dose if needed for leg swelling/weight gain   loratadine  (CLARITIN ) 10 MG tablet Take 10 mg by mouth daily.   losartan  (COZAAR ) 50 MG tablet Take 1 tablet (50 mg total) by mouth daily.   Multiple Vitamin (MULTIVITAMIN WITH MINERALS) TABS tablet Take 1 tablet by mouth daily.   phenol (CHLORASEPTIC GARGLE) 1.4 % LIQD Use as directed 1 spray in the mouth or throat as needed for throat irritation / pain.   Polyvinyl Alcohol-Povidone (TEARS PLUS OP) Place 1 drop into both eyes daily as needed (dry eyes/ redness/ burning).    potassium chloride  SA (KLOR-CON  M20) 20 MEQ tablet Take 1 tablet (20 mEq total) by mouth 2 (two) times daily.   PRESCRIPTION MEDICATION CPAP   saccharomyces boulardii (FLORASTOR) 250 MG capsule Take 1 capsule (250 mg total) by mouth 2 (two) times daily.   triamcinolone  (NASACORT ) 55 MCG/ACT AERO nasal inhaler Place 2 sprays into the nose daily. In each nostril   Vitamin D , Ergocalciferol , (DRISDOL ) 1.25 MG (50000 UNIT) CAPS capsule TAKE 1 CAPSULE BY MOUTH ONCE A WEEK   No facility-administered encounter medications on file as of 11/09/2024.    History: Past Medical History:  Diagnosis Date   Allergy    Anesthesia complication    trouble waking up 2/2 CPAP   Arthritis    Asthma in adult    Cataract 2019   left eye resolved with surgery   Cervical spondylosis 2012   [place])   Choroidal nevus of left eye 03/22/2016   Chronic venous insufficiency 2016   severe reflux with painful varicosities sees [facility]   Clotting disorder    due to vein ablation     Cognitive dysfunction    Complication of anesthesia    difficulty coming out due to sleep apnea per pt   COVID-19 virus infection 07/29/2023   Difficult intubation    PATIENT DENIES    Diverticulosis    per ct scans 09-2016 and 01-2017   DVT (deep venous thrombosis) (HCC) 2011   small, developed after venous ablation   DVT (deep venous thrombosis) (HCC)    Family history of adverse reaction to anesthesia    O2 levels drop upon waking    Hearing loss sensory, bilateral 2013   mod-severe high freq sensorineural ([facility])   Hiatal hernia     09-2016 ct scan HP at cancer center    History of kidney stones 1980s   History of radiation therapy 07/19/11-08/25/2011   RIGHT AXILLARY REGION/METASTATIC   Hypertension    Melanoma (HCC) 06/29/2010   MALIGNANT MELANOMA R SHOULDER/SUPRASCAPULAR BACK s/p interferon chemo and XRT   Neuromuscular disorder (HCC)    muscle spasms   OSA (obstructive sleep apnea)    on CPAP   Pneumonia    2001   RLS (restless legs syndrome)    Seasonal allergies    Sleep apnea    wears cpap   Tinnitus    Unspecified vitamin D  deficiency 03/18/2013   Varicose veins of left lower extremity    Past Surgical History:  Procedure Laterality Date   ABI  2016   WNL  ANTERIOR CERVICAL DECOMP/DISCECTOMY FUSION N/A 08/22/2015   ANTERIOR CERVICAL DECOMPRESSION FUSION C3/4 - interbody graft and anterior plate; Victory Gens, MD   ANTERIOR CERVICAL DECOMP/DISCECTOMY FUSION N/A 07/02/2016   Procedure: Cervical four- five Cervical five- six Cervical six- seven Anterior cervical decompression/diskectomy/fusion;  Surgeon: Victory Gens, MD;  Location: [facility];  Service: Neurosurgery;  Laterality: N/A;  C4-5 C5-6 C6-7 Anterior cervical decompression/diskectomy/fusion   BREAST BIOPSY Left 2013   BENIGN   BREAST SURGERY Right 1990   BX    CARDIOVASCULAR STRESS TEST  2010   normal stress test, EF 66%   CATARACT EXTRACTION W/ INTRAOCULAR LENS IMPLANT Left 2019   Dr.  Rosan   COLONOSCOPY  03/2005   benign polyp, int hem, rpt 5 yrs ([place] , [facility])   COLONOSCOPY WITH PROPOFOL  N/A 01/06/2018   diverticulosis, decreased sphincter tone, no f/u needed Lavonne Victory LITTIE DOUGLAS, MD)   CORONARY ULTRASOUND/IVUS N/A 08/03/2019   Sheree Penne Bruckner, MD   DEEP AXILLARY SENTINEL NODE BIOPSY / EXCISION  2012   RIGHT   DEXA  06/2016   WNL   DILATION AND CURETTAGE OF UTERUS  2010   ENDOVENOUS ABLATION SAPHENOUS VEIN W/ LASER Left 2010   [facility]   ENDOVENOUS ABLATION SAPHENOUS VEIN W/ LASER Left 2016   accessory branch [facility])   ESOPHAGOGASTRODUODENOSCOPY  2006   mod chronic carditis, no H pylori ([place] , [facility])   KNEE SURGERY  1985   left   LOWER EXTREMITY VENOGRAPHY Left 08/03/2019   Sheree Penne Bruckner, MD   LUMBAR LAMINECTOMY  2001   L4/5   MELANOMA EXCISION  2011   Right shoulder, with sentinel lymph node biopsy   PERIPHERAL VASCULAR INTERVENTION Left 08/03/2019   Stent of left common iliac vein with 14 x 60 mm Vici Joelyn, Penne Bruckner, MD)   [facility] REMOVAL  12/05/2011   Procedure: REMOVAL PORT-A-CATH;  Surgeon: Donnice Bury, MD;  Location: [facility];  Service: General;  Laterality: N/A;  left port removal   PORTACATH PLACEMENT     left subclavian   UPPER GASTROINTESTINAL ENDOSCOPY     Family History  Problem Relation Age of Onset   Cancer Mother 59       uterine   CAD Father 43       MI   Hypertension Father 34   Alzheimer's disease Father 57   Heart attack Father    Diabetes Sister    Cancer Paternal Grandmother        melanoma, possibly   Cancer Cousin        x2, breast   Colon cancer Neg Hx    Colon polyps Neg Hx    Rectal cancer Neg Hx    Stomach cancer Neg Hx    Social History   Occupational History   Occupation: retired    Associate Professor: RETIRED    Comment: emergency planning/management officer   Tobacco Use   Smoking status: Former    Current packs/day: 0.00    Types: Cigarettes    Quit date:  11/05/1965    Years since quitting: 59.0    Passive exposure: Never   Smokeless tobacco: Never   Tobacco comments:    socially as a teen  Vaping Use   Vaping status: Former  Substance and Sexual Activity   Alcohol use: No    Alcohol/week: 0.0 standard drinks of alcohol   Drug use: No   Sexual activity: Never   Tobacco Counseling Counseling given: Not Answered Tobacco comments: socially  as a teen  SDOH Screenings   Food Insecurity: No Food Insecurity (11/09/2024)  Housing: Low Risk (11/09/2024)  Transportation Needs: No Transportation Needs (11/09/2024)  Utilities: Not At Risk (11/09/2024)  Alcohol Screen: Low Risk (11/04/2023)  Depression (PHQ2-9): Low Risk (11/09/2024)  Financial Resource Strain: Low Risk (11/04/2023)  Physical Activity: Sufficiently Active (11/09/2024)  Social Connections: Moderately Integrated (11/09/2024)  Stress: No Stress Concern Present (11/09/2024)  Tobacco Use: Medium Risk (11/09/2024)  Health Literacy: Adequate Health Literacy (11/09/2024)   See flowsheets for full screening details  Depression Screen PHQ 2 & 9 Depression Scale- Over the past 2 weeks, how often have you been bothered by any of the following problems? Little interest or pleasure in doing things: 0 Feeling down, depressed, or hopeless (PHQ Adolescent also includes...irritable): 0 PHQ-2 Total Score: 0 Trouble falling or staying asleep, or sleeping too much: 0 Feeling tired or having little energy: 0 Poor appetite or overeating (PHQ Adolescent also includes...weight loss): 0 Feeling bad about yourself - or that you are a failure or have let yourself or your family down: 0 Trouble concentrating on things, such as reading the newspaper or watching television (PHQ Adolescent also includes...like school work): 0 Moving or speaking so slowly that other people could have noticed. Or the opposite - being so fidgety or restless that you have been moving around a lot more than usual: 0 Thoughts that you would be  better off dead, or of hurting yourself in some way: 0 PHQ-9 Total Score: 0 If you checked off any problems, how difficult have these problems made it for you to do your work, take care of things at home, or get along with other people?: Not difficult at all  Depression Treatment Depression Interventions/Treatment : EYV7-0 Score <4 Follow-up Not Indicated     Goals Addressed               This Visit's Progress     Patient Stated (pt-stated)        Patient states she will like to move              Objective:    Today's Vitals   11/09/24 1602  BP: 138/82  Weight: 200 lb (90.7 kg)  Height: 5' (1.524 m)   Body mass index is 39.06 kg/m.  Hearing/Vision screen Hearing Screening - Comments:: No difficulties  Vision Screening - Comments:: Wears glasses for reading  Immunizations and Health Maintenance Health Maintenance  Topic Date Due   Zoster Vaccines- Shingrix (1 of 2) Never done   Mammogram  03/21/2023   COVID-19 Vaccine (7 - 2025-26 season) 07/06/2024   Medicare Annual Wellness (AWV)  11/03/2024   DTaP/Tdap/Td (3 - Td or Tdap) 01/27/2032   Pneumococcal Vaccine: 50+ Years  Completed   Influenza Vaccine  Completed   Bone Density Scan  Completed   Hepatitis C Screening  Completed   Meningococcal B Vaccine  Aged Out   Colonoscopy  Discontinued        Assessment/Plan:  This is a routine wellness examination for [place].  Patient Care Team: Rilla Baller, MD as PCP - General (Family Medicine) Timmy Maude SAUNDERS, MD as Consulting Physician (Oncology) Colon Shove, MD as Consulting Physician (Neurosurgery) Joshua Sieving, MD as Consulting Physician (Dermatology) Verta Royden DASEN, [place] as Consulting Physician (Podiatry) Alaine Vicenta NOVAK, MD as Consulting Physician (Pulmonary Disease) [facility], [facility])  I have personally reviewed and noted the following in the patients chart:   Medical and social history Use of  alcohol, tobacco or illicit drugs   Current medications and supplements including opioid prescriptions. Functional ability and status Nutritional status Physical activity Advanced directives List of other physicians Hospitalizations, surgeries, and ER visits in previous 12 months Vitals Screenings to include cognitive, depression, and falls Referrals and appointments  No orders of the defined types were placed in this encounter.  In addition, I have reviewed and discussed with patient certain preventive protocols, quality metrics, and best practice recommendations. A written personalized care plan for preventive services as well as general preventive health recommendations were provided to patient.   Lyle MARLA Right, [place]   11/09/2024   No follow-ups on file.  After Visit Summary: (MyChart) Due to this being a telephonic visit, the after visit summary with patients personalized plan was offered to patient via MyChart     "

## 2024-11-09 NOTE — Patient Instructions (Signed)
 Ms. [name],  Thank you for taking the time for your Medicare Wellness Visit. I appreciate your continued commitment to your health goals. Please review the care plan we discussed, and feel free to reach out if I can assist you further.  Please note that Annual Wellness Visits do not include a physical exam. Some assessments may be limited, especially if the visit was conducted virtually. If needed, we may recommend an in-person follow-up with your provider.  Ongoing Care Seeing your primary care provider every 3 to 6 months helps us  monitor your health and provide consistent, personalized care.   Referrals If a referral was made during today's visit and you haven't received any updates within two weeks, please contact the referred provider directly to check on the status.  Recommended Screenings:  Health Maintenance  Topic Date Due   Zoster (Shingles) Vaccine (1 of 2) Never done   Breast Cancer Screening  03/21/2023   COVID-19 Vaccine (7 - 2025-26 season) 07/06/2024   Medicare Annual Wellness Visit  11/03/2024   DTaP/Tdap/Td vaccine (3 - Td or Tdap) 01/27/2032   Pneumococcal Vaccine for age over 59  Completed   Flu Shot  Completed   Osteoporosis screening with Bone Density Scan  Completed   Hepatitis C Screening  Completed   Meningitis B Vaccine  Aged Out   Colon Cancer Screening  Discontinued       11/09/2024    4:04 PM  Advanced Directives  Does Patient Have a Medical Advance Directive? No  Would patient like information on creating a medical advance directive? No - Patient declined    Vision: Annual vision screenings are recommended for early detection of glaucoma, cataracts, and diabetic retinopathy. These exams can also reveal signs of chronic conditions such as diabetes and high blood pressure.  Dental: Annual dental screenings help detect early signs of oral cancer, gum disease, and other conditions linked to overall health, including heart disease and diabetes.  Please  see the attached documents for additional preventive care recommendations.

## 2024-11-11 ENCOUNTER — Encounter

## 2024-11-11 DIAGNOSIS — I87332 Chronic venous hypertension (idiopathic) with ulcer and inflammation of left lower extremity: Secondary | ICD-10-CM | POA: Diagnosis not present

## 2024-11-12 ENCOUNTER — Telehealth: Payer: Self-pay | Admitting: Physician Assistant

## 2024-11-12 NOTE — Telephone Encounter (Signed)
 Left message on machine to call back

## 2024-11-12 NOTE — Telephone Encounter (Signed)
 Inbound call from patient stating she is having severe incontinence of feces. Patient is requesting a call to discuss other options. Please advise, thank you

## 2024-11-13 ENCOUNTER — Encounter

## 2024-11-13 DIAGNOSIS — I87332 Chronic venous hypertension (idiopathic) with ulcer and inflammation of left lower extremity: Secondary | ICD-10-CM | POA: Diagnosis not present

## 2024-11-13 NOTE — Telephone Encounter (Signed)
 Spoke with the pt and she tells me that she has chronic diarrhea with fecal incontinence. She has not been seen since March of 2025.  She has been taking imodium once daily and florastor.  I have suggested she take 2 imodium upon waking and then repeat as needed in the afternoon.  She has also been set up for an appt with Camie on 1/15.  She will call back if needed prior to that appt.

## 2024-11-16 ENCOUNTER — Ambulatory Visit: Admitting: Podiatry

## 2024-11-16 ENCOUNTER — Encounter

## 2024-11-16 DIAGNOSIS — Z91199 Patient's noncompliance with other medical treatment and regimen due to unspecified reason: Secondary | ICD-10-CM

## 2024-11-16 DIAGNOSIS — I87332 Chronic venous hypertension (idiopathic) with ulcer and inflammation of left lower extremity: Secondary | ICD-10-CM | POA: Diagnosis not present

## 2024-11-16 NOTE — Progress Notes (Signed)
 1. No-show for appointment

## 2024-11-18 ENCOUNTER — Encounter: Admitting: Physician Assistant

## 2024-11-18 DIAGNOSIS — I87332 Chronic venous hypertension (idiopathic) with ulcer and inflammation of left lower extremity: Secondary | ICD-10-CM | POA: Diagnosis not present

## 2024-11-19 ENCOUNTER — Ambulatory Visit: Admitting: Gastroenterology

## 2024-11-19 VITALS — BP 148/88 | HR 88 | Ht 60.0 in | Wt 203.8 lb

## 2024-11-19 DIAGNOSIS — R159 Full incontinence of feces: Secondary | ICD-10-CM | POA: Diagnosis not present

## 2024-11-19 DIAGNOSIS — K529 Noninfective gastroenteritis and colitis, unspecified: Secondary | ICD-10-CM | POA: Diagnosis not present

## 2024-11-19 NOTE — Patient Instructions (Signed)
 Continue Imodium 4 mg initially followed by 2mg  after each loose stool, maximum 8 mg daily. (FOLLOW PACKAGE INSTRUCTIONS)  Please go to the lab in the basement of our building to have lab work done as you leave today. Hit B for basement when you get on the elevator.  When the doors open the lab is on your left.  We will call you with the results. Thank you.  _______________________________________________________  If your blood pressure at your visit was 140/90 or greater, please contact your primary care physician to follow up on this.  _______________________________________________________  If you are age 79 or older, your body mass index should be between 23-30. Your Body mass index is 39.8 kg/m. If this is out of the aforementioned range listed, please consider follow up with your Primary Care Provider.  If you are age 79 or younger, your body mass index should be between 19-25. Your Body mass index is 39.8 kg/m. If this is out of the aformentioned range listed, please consider follow up with your Primary Care Provider.   ________________________________________________________  The [facility] providers would like to encourage you to use MYCHART to communicate with providers for non-urgent requests or questions.  Due to long hold times on the telephone, sending your provider a message by [facility] may be a faster and more efficient way to get a response.  Please allow 48 business hours for a response.  Please remember that this is for non-urgent requests.  _______________________________________________________  [facility] is using a team-based approach to care.  Your team is made up of your doctor and two to three APPS. Our APPS (Nurse Practitioners and Physician Assistants) work with your physician to ensure care continuity for you. They are fully qualified to address your health concerns and develop a treatment plan. They communicate directly with your gastroenterologist  to care for you. Seeing the Advanced Practice Practitioners on your physician's team can help you by facilitating care more promptly, often allowing for earlier appointments, access to diagnostic testing, procedures, and other specialty referrals.   Due to recent changes in healthcare laws, you may see the results of your imaging and laboratory studies on MyChart before your provider has had a chance to review them.  We understand that in some cases there may be results that are confusing or concerning to you. Not all laboratory results come back in the same time frame and the provider may be waiting for multiple results in order to interpret others.  Please give us  48 hours in order for your provider to thoroughly review all the results before contacting the office for clarification of your results.

## 2024-11-19 NOTE — Progress Notes (Signed)
 "  [name] [id number] 17-Jul-1946   Chief Complaint: Diarrhea  Referring Provider: Rilla Baller, MD Primary GI MD: Dr. Legrand  HPI: [name] is a 79 y.o. female with past medical history of asthma, DVT, diverticulosis, difficult intubation, hiatal hernia, kidney stones, HTN, OSA on CPAP who presents today for a complaint of diarrhea.    01/06/2018 colonoscopy with decreased sphincter tone, diverticulosis in the left and right colon otherwise normal.  No repeat recommended due to age.     04/23/2022 abdominal x-ray normal.     10/24/2022 CBC, CMP normal.  Iron studies and iron percent saturation slightly decreased 18.9, normal ferritin.     04/29/2023 office visit with me at that time discussed issues with diarrhea over the past year.  Discussed trouble with chronic venous ulcers for which she is constantly getting antibiotics.  Had recently started Florastor and her stool was starting to form up a bit.  Describes some IBS in her past.  That time ordered stool studies and discussed trial of Dicyclomine/Hyoscyamine for suspected IBS.     Of note patient never finished stool studies as ordered including pancreatic fecal elastase, calprotectin, WBC stool, ova and parasite and GI profile panel.     12/04/2023 CMP, TSH, vitamin D , ferritin all normal.  Last seen in office 01/27/2024 by Delon Failing, PA for follow-up of chronic diarrhea.  Has been on multiple antibiotics for venous ulcers over the previous year with some benefit from Florastor, decrease in urgency and some increase in form to the stool.  Stool studies were ordered including GI pathogen panel, O&P, fecal calprotectin, fecal leukocytes, fecal pancreatic elastase.  Consideration for antispasmodic.  Consider colonoscopy if above workup unremarkable.  Dr. Legrand advised that if stool studies were negative she should be scheduled for colonoscopy to rule out microscopic colitis.  GI pathogen panel was negative, negative O&P,  normal pancreatic elastase, elevated fecal calprotectin 230.  Patient was advised to proceed with colonoscopy.  She has been scheduled for procedure in the [facility], but had to be rescheduled at [facility] due to history of difficult intubation. Patient endorsed worsening symptoms and was advised to try Imodium 2-3 times daily scheduled.  If she had already tried Imodium, recommendation was for Lomotil 1 tab every 6 hours.  Was unable to make her scheduled colonoscopy so was canceled.  Has yet to be rescheduled.  Patient called 11/12/2024 reporting chronic diarrhea with fecal incontinence, and had been taking Imodium once daily and Florastor.  Was advised to take 2 Imodium upon waking and then repeat as needed in the afternoon.  Scheduled for follow-up office visit.   Discussed the use of AI scribe software for clinical note transcription with the patient, who gave verbal consent to proceed.  History of Present Illness [name] is a 79 year old female with chronic diarrhea and fecal incontinence who presents for evaluation of worsening diarrhea.  Diarrheal Symptoms and Fecal Incontinence: - Chronic diarrhea has recently worsened, characterized by frequent, urgent, and watery bowel movements - Episodes often coincide with urination and are most pronounced upon standing in the morning or after periods of relaxation - Significant urgency and fecal incontinence requiring use of Poise pads, which sometimes become saturated before reaching the toilet - No blood in stool, fever, or chills - Abdominal pain is rare; no significant abdominal distension - Appetite is preserved, but altered taste is present, particularly with flavored coffees, attributed to current use of antibiotics - Increased bowel sounds and  gurgling most noticeable in the evenings after meals - No recent weight loss - Periodic sensation every few weeks of lower abdomen feeling like a couch of fluid that jiggles, transient in  nature  Antibiotic Exposure and Medication Effects: - Currently on third course of antibiotics in six weeks for a leg infection, including levofloxacin  and topical antibiotic spray - Two prior 14-day courses of antibiotics completed - Tamiflu taken three weeks ago for influenza A, which exacerbated diarrhea and altered taste - Florastor (probiotic) and over-the-counter Imodium (loperamide/simethicone) used; current dosing is two tablets (4 mg) in the morning and one (2 mg) in the afternoon as needed, previously one tablet daily for six months - No history of C. difficile infection despite multiple antibiotic courses  Gastrointestinal Workup and Prior Findings: - Multiple prior stool studies negative for infection; one fecal calprotectin was elevated - Prior colonoscopy demonstrated decreased anal sphincter tone - Prefers to repeat stool studies rather than undergo another colonoscopy due to concerns about anesthesia and untreated sleep apnea  Irritable Bowel Symptoms: - History of irritable bowel symptoms since late teens and twenties, previously referred to as colitis, often triggered by stress and resolved after major life changes - No significant issues with colitis or missed work since then, except for occasional medication-induced diarrhea  Additional Relevant Medical History: - Severe untreated sleep apnea due to equipment issues - Two prior neck surgeries - Sensitivity to steroids and opioids - Mild cognitive impairment diagnosed three years ago - Melanoma treated with interferon - Uses a rescue inhaler for occasional upper airway wheezing   Previous GI Procedures/Imaging   Colonoscopy 01/06/2018 - Decreased sphincter tone found on digital rectal exam.  - Diverticulosis in the left colon and in the right colon.  - The examination was otherwise normal on direct and retroflexion views.  - No specimens collected. - No recall due to age  Past Medical History:  Diagnosis Date    Allergy    Anesthesia complication    trouble waking up 2/2 CPAP   Arthritis    Asthma in adult    Cataract 2019   left eye resolved with surgery   Cervical spondylosis 2012   [place] [place])   Choroidal nevus of left eye 03/22/2016   Chronic venous insufficiency 2016   severe reflux with painful varicosities sees [facility]   Clotting disorder    due to vein ablation    Cognitive dysfunction    Complication of anesthesia    difficulty coming out due to sleep apnea per pt   COVID-19 virus infection 07/29/2023   Difficult intubation    PATIENT DENIES    Diverticulosis    per ct scans 09-2016 and 01-2017   DVT (deep venous thrombosis) (HCC) 2011   small, developed after venous ablation   DVT (deep venous thrombosis) (HCC)    Family history of adverse reaction to anesthesia    O2 levels drop upon waking    Hearing loss sensory, bilateral 2013   mod-severe high freq sensorineural ([facility])   Hiatal hernia     09-2016 ct scan HP at cancer center    History of kidney stones 1980s   History of radiation therapy 07/19/11-08/25/2011   RIGHT AXILLARY REGION/METASTATIC   Hypertension    Melanoma (HCC) 06/29/2010   MALIGNANT MELANOMA R SHOULDER/SUPRASCAPULAR BACK s/p interferon chemo and XRT   Neuromuscular disorder (HCC)    muscle spasms   OSA (obstructive sleep apnea)    on CPAP   Pneumonia  2001   RLS (restless legs syndrome)    Seasonal allergies    Sleep apnea    wears cpap   Tinnitus    Unspecified vitamin D  deficiency 03/18/2013   Varicose veins of left lower extremity     Past Surgical History:  Procedure Laterality Date   ABI  2016   WNL   ANTERIOR CERVICAL DECOMP/DISCECTOMY FUSION N/A 08/22/2015   ANTERIOR CERVICAL DECOMPRESSION FUSION C3/4 - interbody graft and anterior plate; Victory Gens, MD   ANTERIOR CERVICAL DECOMP/DISCECTOMY FUSION N/A 07/02/2016   Procedure: Cervical four- five Cervical five- six Cervical six- seven Anterior cervical  decompression/diskectomy/fusion;  Surgeon: Victory Gens, MD;  Location: [facility];  Service: Neurosurgery;  Laterality: N/A;  C4-5 C5-6 C6-7 Anterior cervical decompression/diskectomy/fusion   BREAST BIOPSY Left 2013   BENIGN   BREAST SURGERY Right 1990   BX    CARDIOVASCULAR STRESS TEST  2010   normal stress test, EF 66%   CATARACT EXTRACTION W/ INTRAOCULAR LENS IMPLANT Left 2019   Dr. Rosan   COLONOSCOPY  03/2005   benign polyp, int hem, rpt 5 yrs ([place] , [place])   COLONOSCOPY WITH PROPOFOL  N/A 01/06/2018   diverticulosis, decreased sphincter tone, no f/u needed Lavonne Victory LITTIE DOUGLAS, MD)   CORONARY ULTRASOUND/IVUS N/A 08/03/2019   Sheree Penne Bruckner, MD   DEEP AXILLARY SENTINEL NODE BIOPSY / EXCISION  2012   RIGHT   DEXA  06/2016   WNL   DILATION AND CURETTAGE OF UTERUS  2010   ENDOVENOUS ABLATION SAPHENOUS VEIN W/ LASER Left 2010   [facility]   ENDOVENOUS ABLATION SAPHENOUS VEIN W/ LASER Left 2016   accessory branch [facility])   ESOPHAGOGASTRODUODENOSCOPY  2006   mod chronic carditis, no H pylori ([place] , [facility])   KNEE SURGERY  1985   left   LOWER EXTREMITY VENOGRAPHY Left 08/03/2019   Sheree Penne Bruckner, MD   LUMBAR LAMINECTOMY  2001   L4/5   MELANOMA EXCISION  2011   Right shoulder, with sentinel lymph node biopsy   PERIPHERAL VASCULAR INTERVENTION Left 08/03/2019   Stent of left common iliac vein with 14 x 60 mm Vici Joelyn, Penne Bruckner, MD)   [facility] REMOVAL  12/05/2011   Procedure: REMOVAL PORT-A-CATH;  Surgeon: Donnice Bury, MD;  Location: [facility];  Service: General;  Laterality: N/A;  left port removal   PORTACATH PLACEMENT     left subclavian   UPPER GASTROINTESTINAL ENDOSCOPY      Current Outpatient Medications  Medication Sig Dispense Refill   acetaminophen  (TYLENOL ) 650 MG CR tablet Take 2 tablets (1,300 mg total) by mouth 2 (two) times daily as needed for pain.     albuterol  (VENTOLIN  HFA) 108 (90 Base)  MCG/ACT inhaler INHALE 1-2 PUFFS BY MOUTH EVERY 6 HOURS AS NEEDED FOR WHEEZE OR SHORTNESS OF BREATH 8.5 each 2   aspirin  EC 81 MG tablet Take 81 mg by mouth at bedtime. Melanoma prevention     furosemide  (LASIX ) 40 MG tablet Take 1 tablet (40 mg total) by mouth daily. Take second dose if needed for leg swelling/weight gain 100 tablet 4   levofloxacin  (LEVAQUIN ) 750 MG tablet Take 750 mg by mouth daily.     loratadine  (CLARITIN ) 10 MG tablet Take 10 mg by mouth daily.     losartan  (COZAAR ) 50 MG tablet Take 1 tablet (50 mg total) by mouth daily. 90 tablet 4   Multiple Vitamin (MULTIVITAMIN WITH MINERALS) TABS tablet Take 1 tablet by mouth  daily.     phenol (CHLORASEPTIC GARGLE) 1.4 % LIQD Use as directed 1 spray in the mouth or throat as needed for throat irritation / pain. 118 mL 0   Polyvinyl Alcohol-Povidone (TEARS PLUS OP) Place 1 drop into both eyes daily as needed (dry eyes/ redness/ burning).      potassium chloride  SA (KLOR-CON  M20) 20 MEQ tablet Take 1 tablet (20 mEq total) by mouth 2 (two) times daily. 180 tablet 4   PRESCRIPTION MEDICATION CPAP     saccharomyces boulardii (FLORASTOR) 250 MG capsule Take 1 capsule (250 mg total) by mouth 2 (two) times daily.     triamcinolone  (NASACORT ) 55 MCG/ACT AERO nasal inhaler Place 2 sprays into the nose daily. In each nostril     Vitamin D , Ergocalciferol , (DRISDOL ) 1.25 MG (50000 UNIT) CAPS capsule TAKE 1 CAPSULE BY MOUTH ONCE A WEEK 12 capsule 4   No current facility-administered medications for this visit.    Allergies as of 11/19/2024 - Review Complete 11/19/2024  Allergen Reaction Noted   Sulfa antibiotics Itching, Swelling, and Shortness Of Breath 08/16/2015   Voltaren  [diclofenac  sodium] Shortness Of Breath 08/16/2017   Latex Other (See Comments) 06/22/2016   Tape Other (See Comments) 08/16/2015   Diclofenac  sodium  04/01/2024   Doxycycline Other (See Comments) 02/01/2020   Gabapentin  Other (See Comments) 11/26/2022   Other   11/22/2017   Wound dressing adhesive Rash 12/10/2017    Family History  Problem Relation Age of Onset   Cancer Mother 62       uterine   CAD Father 41       MI   Hypertension Father 20   Alzheimer's disease Father 68   Heart attack Father    Diabetes Sister    Cancer Paternal Grandmother        melanoma, possibly   Cancer Cousin        x2, breast   Colon cancer Neg Hx    Colon polyps Neg Hx    Rectal cancer Neg Hx    Stomach cancer Neg Hx     Social History[1]   Review of Systems:    Constitutional: No weight loss, fever, chills Cardiovascular: No chest pain  Respiratory: No SOB  Gastrointestinal: See HPI and otherwise negative   Physical Exam:  Vital signs: BP (!) 148/88   Pulse 88   Ht 5' (1.524 m)   Wt 203 lb 12.8 oz (92.4 kg)   BMI 39.80 kg/m   Wt Readings from Last 3 Encounters:  11/19/24 203 lb 12.8 oz (92.4 kg)  11/09/24 200 lb (90.7 kg)  09/16/24 207 lb (93.9 kg)     Constitutional: Pleasant, obese female in NAD, alert and cooperative Head:  Normocephalic and atraumatic.  Respiratory: Respirations even and unlabored. Lungs clear to auscultation bilaterally.  No wheezes, crackles, or rhonchi.  Cardiovascular:  Regular rate and rhythm. No murmurs. No peripheral edema. Gastrointestinal:  Soft, nondistended, nontender. No rebound or guarding. Normal bowel sounds. No appreciable masses or hepatomegaly. Rectal:  Not performed.  Neurologic:  Alert and oriented x4;  grossly normal neurologically.  Skin:   Dry and intact without significant lesions or rashes. Psychiatric: Oriented to person, place and time. Demonstrates good judgement and reason without abnormal affect or behaviors.   Assessment/Plan:   Assessment & Plan Chronic diarrhea with fecal incontinence Chronic diarrhea with recent worsening, possibly due to recent antibiotic use and viral infection. Denies any rectal bleeding, fever, chills.  No significant abdominal pain or distention.  Stable  appetite without weight loss.  We had previously been trying to get her scheduled for a colonoscopy at [facility] for further evaluation of her chronic diarrhea and due to the finding of an elevated fecal calprotectin, but this was canceled and patient has yet to reschedule.  At this time she is concerned about the possibility of C. difficile and prefers to repeat stool studies initially before considering colonoscopy.  - Ordered stool studies: C difficile PCR, stool culture, repeat fecal calprotectin. - Continue OTC imodium 4 mg in the morning, 2 mg after each diarrhea episode, up to 8 mg daily. - Continued Florastor (probiotic) during antibiotic therapy. - Provided guidance on warning signs (fever, chills, significant abdominal distension, severe abdominal pain) and advised ER visit if these occur. - Follow up with Dr. Legrand to discuss colonoscopy should symptoms persist, at this time patient has reservations about another colonoscopy due to sedation - Procedure would need to be done in hospital setting due to patient's history of difficult intubation  History of decreased anal sphincter tone Decreased anal sphincter tone contributes to fecal incontinence. Pelvic floor therapy discussed but deferred.  - Discussed pelvic floor physical therapy as potential intervention, deferred per her preference.    Camie Furbish, PA-C [facility] 11/19/2024, 1:41 PM  Patient Care Team: Rilla Baller, MD as PCP - General (Family Medicine) Timmy Maude SAUNDERS, MD as Consulting Physician (Oncology) Colon Shove, MD as Consulting Physician (Neurosurgery) Joshua Sieving, MD as Consulting Physician (Dermatology) Verta Royden DASEN, DPM as Consulting Physician (Podiatry) Alaine Vicenta NOVAK, MD as Consulting Physician (Pulmonary Disease) Pa, [facility] (Optometry)       [1]  Social History Tobacco Use   Smoking status: Former    Current packs/day: 0.00    Types: Cigarettes     Quit date: 11/05/1965    Years since quitting: 59.0    Passive exposure: Never   Smokeless tobacco: Never   Tobacco comments:    socially as a teen  Vaping Use   Vaping status: Former  Substance Use Topics   Alcohol use: No    Alcohol/week: 0.0 standard drinks of alcohol   Drug use: No   "

## 2024-11-20 ENCOUNTER — Encounter: Payer: Self-pay | Admitting: Gastroenterology

## 2024-11-20 ENCOUNTER — Encounter

## 2024-11-20 DIAGNOSIS — I87332 Chronic venous hypertension (idiopathic) with ulcer and inflammation of left lower extremity: Secondary | ICD-10-CM | POA: Diagnosis not present

## 2024-11-23 ENCOUNTER — Encounter

## 2024-11-24 ENCOUNTER — Encounter

## 2024-11-24 DIAGNOSIS — I87332 Chronic venous hypertension (idiopathic) with ulcer and inflammation of left lower extremity: Secondary | ICD-10-CM | POA: Diagnosis not present

## 2024-11-25 ENCOUNTER — Encounter: Admitting: Physician Assistant

## 2024-11-25 NOTE — Progress Notes (Signed)
 ____________________________________________________________  Attending physician addendum:  Thank you for sending this case to me. I have reviewed the entire note and agree with the plan.  Ultimately, I still think this patient's diarrhea is likely related to frequent need for antibiotics resulting in colonic dysbiosis and dysmotility. If negative for C. difficile, and given increased sedation risks with need for colonoscopy to be done in the hospital Endo lab as well as her concerns about that, it would also be reasonable to give her a trial of Entocort for possible microscopic colitis.  It is a low risk medication to try for 2 to 3 months.  Victory Brand, MD  ____________________________________________________________

## 2024-11-27 ENCOUNTER — Encounter

## 2024-11-27 DIAGNOSIS — I87332 Chronic venous hypertension (idiopathic) with ulcer and inflammation of left lower extremity: Secondary | ICD-10-CM | POA: Diagnosis not present

## 2024-11-30 ENCOUNTER — Encounter

## 2024-12-02 ENCOUNTER — Encounter: Admitting: Physician Assistant

## 2024-12-02 DIAGNOSIS — I87332 Chronic venous hypertension (idiopathic) with ulcer and inflammation of left lower extremity: Secondary | ICD-10-CM | POA: Diagnosis not present

## 2024-12-04 ENCOUNTER — Encounter

## 2024-12-04 DIAGNOSIS — I87332 Chronic venous hypertension (idiopathic) with ulcer and inflammation of left lower extremity: Secondary | ICD-10-CM | POA: Diagnosis not present

## 2024-12-07 ENCOUNTER — Encounter

## 2024-12-09 ENCOUNTER — Encounter: Admitting: Physician Assistant

## 2024-12-11 ENCOUNTER — Encounter

## 2024-12-14 ENCOUNTER — Encounter

## 2024-12-16 ENCOUNTER — Encounter: Admitting: Physician Assistant

## 2024-12-18 ENCOUNTER — Encounter

## 2025-01-19 ENCOUNTER — Ambulatory Visit: Admitting: Gastroenterology

## 2025-08-12 ENCOUNTER — Other Ambulatory Visit

## 2025-08-12 ENCOUNTER — Ambulatory Visit: Admitting: Hematology & Oncology

## 2025-11-11 ENCOUNTER — Ambulatory Visit
